# Patient Record
Sex: Female | Born: 1955 | Race: White | Hispanic: No | Smoking: Never smoker
Health system: Southern US, Community
[De-identification: ages and names within clinical notes are randomized; demographics above are authoritative.]

## PROBLEM LIST (undated history)

## (undated) DIAGNOSIS — E559 Vitamin D deficiency, unspecified: Secondary | ICD-10-CM

## (undated) DIAGNOSIS — I447 Left bundle-branch block, unspecified: Secondary | ICD-10-CM

## (undated) DIAGNOSIS — I219 Acute myocardial infarction, unspecified: Secondary | ICD-10-CM

## (undated) DIAGNOSIS — N189 Chronic kidney disease, unspecified: Secondary | ICD-10-CM

## (undated) DIAGNOSIS — I214 Non-ST elevation (NSTEMI) myocardial infarction: Secondary | ICD-10-CM

## (undated) DIAGNOSIS — A419 Sepsis, unspecified organism: Secondary | ICD-10-CM

## (undated) DIAGNOSIS — L97409 Non-pressure chronic ulcer of unspecified heel and midfoot with unspecified severity: Secondary | ICD-10-CM

## (undated) DIAGNOSIS — I10 Essential (primary) hypertension: Secondary | ICD-10-CM

## (undated) DIAGNOSIS — Z95810 Presence of automatic (implantable) cardiac defibrillator: Secondary | ICD-10-CM

## (undated) DIAGNOSIS — I2510 Atherosclerotic heart disease of native coronary artery without angina pectoris: Secondary | ICD-10-CM

## (undated) DIAGNOSIS — G4733 Obstructive sleep apnea (adult) (pediatric): Secondary | ICD-10-CM

## (undated) DIAGNOSIS — E119 Type 2 diabetes mellitus without complications: Secondary | ICD-10-CM

## (undated) DIAGNOSIS — I5021 Acute systolic (congestive) heart failure: Secondary | ICD-10-CM

## (undated) DIAGNOSIS — H269 Unspecified cataract: Secondary | ICD-10-CM

## (undated) DIAGNOSIS — Z9989 Dependence on other enabling machines and devices: Secondary | ICD-10-CM

## (undated) DIAGNOSIS — I5022 Chronic systolic (congestive) heart failure: Secondary | ICD-10-CM

## (undated) DIAGNOSIS — M86179 Other acute osteomyelitis, unspecified ankle and foot: Secondary | ICD-10-CM

## (undated) DIAGNOSIS — Z9289 Personal history of other medical treatment: Secondary | ICD-10-CM

## (undated) DIAGNOSIS — G629 Polyneuropathy, unspecified: Secondary | ICD-10-CM

## (undated) DIAGNOSIS — B019 Varicella without complication: Secondary | ICD-10-CM

## (undated) DIAGNOSIS — E669 Obesity, unspecified: Secondary | ICD-10-CM

## (undated) DIAGNOSIS — E039 Hypothyroidism, unspecified: Secondary | ICD-10-CM

## (undated) DIAGNOSIS — F329 Major depressive disorder, single episode, unspecified: Secondary | ICD-10-CM

## (undated) DIAGNOSIS — E7800 Pure hypercholesterolemia, unspecified: Secondary | ICD-10-CM

## (undated) DIAGNOSIS — G709 Myoneural disorder, unspecified: Secondary | ICD-10-CM

## (undated) DIAGNOSIS — F32A Depression, unspecified: Secondary | ICD-10-CM

## (undated) DIAGNOSIS — M1990 Unspecified osteoarthritis, unspecified site: Secondary | ICD-10-CM

## (undated) DIAGNOSIS — D649 Anemia, unspecified: Secondary | ICD-10-CM

## (undated) HISTORY — DX: Vitamin D deficiency, unspecified: E55.9

## (undated) HISTORY — DX: Other acute osteomyelitis, unspecified ankle and foot: M86.179

## (undated) HISTORY — PX: BACK SURGERY: SHX140

## (undated) HISTORY — DX: Non-ST elevation (NSTEMI) myocardial infarction: I21.4

## (undated) HISTORY — DX: Varicella without complication: B01.9

## (undated) HISTORY — DX: Pure hypercholesterolemia, unspecified: E78.00

## (undated) HISTORY — DX: Unspecified cataract: H26.9

## (undated) HISTORY — DX: Depression, unspecified: F32.A

## (undated) HISTORY — DX: Essential (primary) hypertension: I10

## (undated) HISTORY — DX: Myoneural disorder, unspecified: G70.9

## (undated) HISTORY — DX: Major depressive disorder, single episode, unspecified: F32.9

## (undated) HISTORY — DX: Non-pressure chronic ulcer of unspecified heel and midfoot with unspecified severity: L97.409

## (undated) HISTORY — DX: Chronic kidney disease, unspecified: N18.9

## (undated) HISTORY — DX: Polyneuropathy, unspecified: G62.9

## (undated) HISTORY — DX: Obesity, unspecified: E66.9

## (undated) HISTORY — DX: Dependence on other enabling machines and devices: Z99.89

## (undated) HISTORY — DX: Acute systolic (congestive) heart failure: I50.21

## (undated) HISTORY — DX: Obstructive sleep apnea (adult) (pediatric): G47.33

## (undated) HISTORY — DX: Anemia, unspecified: D64.9

---

## 1979-10-08 HISTORY — PX: TUBAL LIGATION: SHX77

## 1994-10-07 HISTORY — PX: LUMBAR DISC SURGERY: SHX700

## 1998-05-01 ENCOUNTER — Other Ambulatory Visit: Admission: RE | Admit: 1998-05-01 | Discharge: 1998-05-01 | Payer: Self-pay | Admitting: Obstetrics and Gynecology

## 1998-05-29 ENCOUNTER — Other Ambulatory Visit: Admission: RE | Admit: 1998-05-29 | Discharge: 1998-05-29 | Payer: Self-pay | Admitting: Obstetrics and Gynecology

## 1999-09-12 ENCOUNTER — Encounter: Admission: RE | Admit: 1999-09-12 | Discharge: 1999-09-12 | Payer: Self-pay | Admitting: Obstetrics and Gynecology

## 1999-09-12 ENCOUNTER — Encounter: Payer: Self-pay | Admitting: Obstetrics and Gynecology

## 2002-08-12 ENCOUNTER — Encounter: Payer: Self-pay | Admitting: Obstetrics and Gynecology

## 2002-08-12 ENCOUNTER — Encounter: Admission: RE | Admit: 2002-08-12 | Discharge: 2002-08-12 | Payer: Self-pay | Admitting: Obstetrics and Gynecology

## 2004-04-16 ENCOUNTER — Ambulatory Visit (HOSPITAL_COMMUNITY): Admission: RE | Admit: 2004-04-16 | Discharge: 2004-04-16 | Payer: Self-pay | Admitting: Obstetrics and Gynecology

## 2005-05-16 ENCOUNTER — Ambulatory Visit (HOSPITAL_COMMUNITY): Admission: RE | Admit: 2005-05-16 | Discharge: 2005-05-16 | Payer: Self-pay | Admitting: Obstetrics and Gynecology

## 2005-10-07 HISTORY — PX: INCISION AND DRAINAGE ABSCESS: SHX5864

## 2006-02-19 ENCOUNTER — Inpatient Hospital Stay (HOSPITAL_COMMUNITY): Admission: EM | Admit: 2006-02-19 | Discharge: 2006-03-07 | Payer: Self-pay | Admitting: Emergency Medicine

## 2006-02-19 ENCOUNTER — Ambulatory Visit: Payer: Self-pay | Admitting: Family Medicine

## 2006-05-19 ENCOUNTER — Ambulatory Visit (HOSPITAL_COMMUNITY): Admission: RE | Admit: 2006-05-19 | Discharge: 2006-05-19 | Payer: Self-pay | Admitting: Obstetrics and Gynecology

## 2007-09-07 ENCOUNTER — Encounter: Admission: RE | Admit: 2007-09-07 | Discharge: 2007-09-07 | Payer: Self-pay | Admitting: Obstetrics and Gynecology

## 2008-09-07 ENCOUNTER — Encounter: Admission: RE | Admit: 2008-09-07 | Discharge: 2008-09-07 | Payer: Self-pay | Admitting: Internal Medicine

## 2009-10-07 DIAGNOSIS — G629 Polyneuropathy, unspecified: Secondary | ICD-10-CM

## 2009-10-07 HISTORY — DX: Polyneuropathy, unspecified: G62.9

## 2009-10-07 HISTORY — PX: LAPAROSCOPIC CHOLECYSTECTOMY: SUR755

## 2009-10-11 ENCOUNTER — Encounter: Admission: RE | Admit: 2009-10-11 | Discharge: 2009-10-11 | Payer: Self-pay | Admitting: Obstetrics and Gynecology

## 2009-11-27 ENCOUNTER — Encounter: Admission: RE | Admit: 2009-11-27 | Discharge: 2009-11-27 | Payer: Self-pay | Admitting: Family Medicine

## 2009-12-31 ENCOUNTER — Emergency Department (HOSPITAL_COMMUNITY): Admission: EM | Admit: 2009-12-31 | Discharge: 2009-12-31 | Payer: Self-pay | Admitting: Emergency Medicine

## 2010-02-09 ENCOUNTER — Ambulatory Visit (HOSPITAL_COMMUNITY): Admission: RE | Admit: 2010-02-09 | Discharge: 2010-02-09 | Payer: Self-pay | Admitting: General Surgery

## 2010-02-22 ENCOUNTER — Ambulatory Visit (HOSPITAL_COMMUNITY): Admission: RE | Admit: 2010-02-22 | Discharge: 2010-02-22 | Payer: Self-pay | Admitting: Cardiology

## 2010-02-23 ENCOUNTER — Encounter: Admission: RE | Admit: 2010-02-23 | Discharge: 2010-02-23 | Payer: Self-pay | Admitting: General Surgery

## 2010-03-22 ENCOUNTER — Encounter: Admission: RE | Admit: 2010-03-22 | Discharge: 2010-03-22 | Payer: Self-pay | Admitting: Internal Medicine

## 2010-05-02 ENCOUNTER — Encounter: Admission: RE | Admit: 2010-05-02 | Discharge: 2010-05-02 | Payer: Self-pay | Admitting: Obstetrics and Gynecology

## 2010-05-17 ENCOUNTER — Ambulatory Visit: Payer: Self-pay | Admitting: Family Medicine

## 2010-05-17 ENCOUNTER — Inpatient Hospital Stay (HOSPITAL_COMMUNITY): Admission: EM | Admit: 2010-05-17 | Discharge: 2010-05-23 | Payer: Self-pay | Admitting: Internal Medicine

## 2010-05-18 ENCOUNTER — Ambulatory Visit: Payer: Self-pay | Admitting: Physical Medicine & Rehabilitation

## 2010-05-23 ENCOUNTER — Inpatient Hospital Stay (HOSPITAL_COMMUNITY)
Admission: RE | Admit: 2010-05-23 | Discharge: 2010-06-05 | Payer: Self-pay | Admitting: Physical Medicine & Rehabilitation

## 2010-05-25 ENCOUNTER — Ambulatory Visit: Payer: Self-pay | Admitting: Physical Medicine & Rehabilitation

## 2010-06-06 ENCOUNTER — Encounter: Payer: Self-pay | Admitting: Physical Medicine & Rehabilitation

## 2010-06-07 ENCOUNTER — Encounter: Payer: Self-pay | Admitting: Physical Medicine & Rehabilitation

## 2010-07-04 ENCOUNTER — Encounter
Admission: RE | Admit: 2010-07-04 | Discharge: 2010-07-11 | Payer: Self-pay | Source: Home / Self Care | Attending: Physical Medicine & Rehabilitation | Admitting: Physical Medicine & Rehabilitation

## 2010-07-07 ENCOUNTER — Encounter: Payer: Self-pay | Admitting: Physical Medicine & Rehabilitation

## 2010-07-11 ENCOUNTER — Ambulatory Visit: Payer: Self-pay | Admitting: Physical Medicine & Rehabilitation

## 2010-08-07 ENCOUNTER — Encounter: Payer: Self-pay | Admitting: Physical Medicine & Rehabilitation

## 2010-09-06 ENCOUNTER — Encounter: Payer: Self-pay | Admitting: Physical Medicine & Rehabilitation

## 2010-09-21 ENCOUNTER — Ambulatory Visit: Payer: Self-pay | Admitting: Physical Medicine & Rehabilitation

## 2010-10-07 ENCOUNTER — Encounter: Payer: Self-pay | Admitting: Physical Medicine & Rehabilitation

## 2010-10-22 ENCOUNTER — Encounter
Admission: RE | Admit: 2010-10-22 | Discharge: 2010-10-22 | Payer: Self-pay | Source: Home / Self Care | Attending: Obstetrics and Gynecology | Admitting: Obstetrics and Gynecology

## 2010-10-24 ENCOUNTER — Encounter
Admission: RE | Admit: 2010-10-24 | Discharge: 2010-10-29 | Payer: Self-pay | Source: Home / Self Care | Attending: Physical Medicine & Rehabilitation | Admitting: Physical Medicine & Rehabilitation

## 2010-10-28 ENCOUNTER — Encounter: Payer: Self-pay | Admitting: General Surgery

## 2010-10-29 ENCOUNTER — Ambulatory Visit
Admission: RE | Admit: 2010-10-29 | Discharge: 2010-10-29 | Payer: Self-pay | Source: Home / Self Care | Attending: Physical Medicine & Rehabilitation | Admitting: Physical Medicine & Rehabilitation

## 2010-11-07 ENCOUNTER — Encounter: Payer: Self-pay | Admitting: Physical Medicine & Rehabilitation

## 2010-11-28 ENCOUNTER — Ambulatory Visit (INDEPENDENT_AMBULATORY_CARE_PROVIDER_SITE_OTHER): Payer: BC Managed Care – PPO | Admitting: Gynecology

## 2010-11-28 DIAGNOSIS — D259 Leiomyoma of uterus, unspecified: Secondary | ICD-10-CM

## 2010-11-28 DIAGNOSIS — N898 Other specified noninflammatory disorders of vagina: Secondary | ICD-10-CM

## 2010-12-03 ENCOUNTER — Other Ambulatory Visit: Payer: BC Managed Care – PPO

## 2010-12-03 ENCOUNTER — Ambulatory Visit (INDEPENDENT_AMBULATORY_CARE_PROVIDER_SITE_OTHER): Payer: BC Managed Care – PPO | Admitting: Gynecology

## 2010-12-03 DIAGNOSIS — R109 Unspecified abdominal pain: Secondary | ICD-10-CM

## 2010-12-03 DIAGNOSIS — N83209 Unspecified ovarian cyst, unspecified side: Secondary | ICD-10-CM

## 2010-12-03 DIAGNOSIS — R1904 Left lower quadrant abdominal swelling, mass and lump: Secondary | ICD-10-CM

## 2010-12-03 DIAGNOSIS — D259 Leiomyoma of uterus, unspecified: Secondary | ICD-10-CM

## 2010-12-05 ENCOUNTER — Emergency Department: Payer: Self-pay | Admitting: Emergency Medicine

## 2010-12-06 ENCOUNTER — Encounter: Payer: Self-pay | Admitting: Physical Medicine & Rehabilitation

## 2010-12-21 LAB — GLUCOSE, CAPILLARY
Glucose-Capillary: 100 mg/dL — ABNORMAL HIGH (ref 70–99)
Glucose-Capillary: 103 mg/dL — ABNORMAL HIGH (ref 70–99)
Glucose-Capillary: 120 mg/dL — ABNORMAL HIGH (ref 70–99)
Glucose-Capillary: 122 mg/dL — ABNORMAL HIGH (ref 70–99)
Glucose-Capillary: 123 mg/dL — ABNORMAL HIGH (ref 70–99)
Glucose-Capillary: 124 mg/dL — ABNORMAL HIGH (ref 70–99)
Glucose-Capillary: 130 mg/dL — ABNORMAL HIGH (ref 70–99)
Glucose-Capillary: 134 mg/dL — ABNORMAL HIGH (ref 70–99)
Glucose-Capillary: 136 mg/dL — ABNORMAL HIGH (ref 70–99)
Glucose-Capillary: 140 mg/dL — ABNORMAL HIGH (ref 70–99)
Glucose-Capillary: 144 mg/dL — ABNORMAL HIGH (ref 70–99)
Glucose-Capillary: 145 mg/dL — ABNORMAL HIGH (ref 70–99)
Glucose-Capillary: 146 mg/dL — ABNORMAL HIGH (ref 70–99)
Glucose-Capillary: 147 mg/dL — ABNORMAL HIGH (ref 70–99)
Glucose-Capillary: 147 mg/dL — ABNORMAL HIGH (ref 70–99)
Glucose-Capillary: 148 mg/dL — ABNORMAL HIGH (ref 70–99)
Glucose-Capillary: 149 mg/dL — ABNORMAL HIGH (ref 70–99)
Glucose-Capillary: 153 mg/dL — ABNORMAL HIGH (ref 70–99)
Glucose-Capillary: 155 mg/dL — ABNORMAL HIGH (ref 70–99)
Glucose-Capillary: 155 mg/dL — ABNORMAL HIGH (ref 70–99)
Glucose-Capillary: 157 mg/dL — ABNORMAL HIGH (ref 70–99)
Glucose-Capillary: 158 mg/dL — ABNORMAL HIGH (ref 70–99)
Glucose-Capillary: 160 mg/dL — ABNORMAL HIGH (ref 70–99)
Glucose-Capillary: 160 mg/dL — ABNORMAL HIGH (ref 70–99)
Glucose-Capillary: 161 mg/dL — ABNORMAL HIGH (ref 70–99)
Glucose-Capillary: 164 mg/dL — ABNORMAL HIGH (ref 70–99)
Glucose-Capillary: 167 mg/dL — ABNORMAL HIGH (ref 70–99)
Glucose-Capillary: 170 mg/dL — ABNORMAL HIGH (ref 70–99)
Glucose-Capillary: 173 mg/dL — ABNORMAL HIGH (ref 70–99)
Glucose-Capillary: 189 mg/dL — ABNORMAL HIGH (ref 70–99)
Glucose-Capillary: 194 mg/dL — ABNORMAL HIGH (ref 70–99)
Glucose-Capillary: 209 mg/dL — ABNORMAL HIGH (ref 70–99)
Glucose-Capillary: 221 mg/dL — ABNORMAL HIGH (ref 70–99)
Glucose-Capillary: 222 mg/dL — ABNORMAL HIGH (ref 70–99)
Glucose-Capillary: 226 mg/dL — ABNORMAL HIGH (ref 70–99)
Glucose-Capillary: 234 mg/dL — ABNORMAL HIGH (ref 70–99)
Glucose-Capillary: 234 mg/dL — ABNORMAL HIGH (ref 70–99)
Glucose-Capillary: 255 mg/dL — ABNORMAL HIGH (ref 70–99)
Glucose-Capillary: 75 mg/dL (ref 70–99)
Glucose-Capillary: 78 mg/dL (ref 70–99)
Glucose-Capillary: 78 mg/dL (ref 70–99)
Glucose-Capillary: 85 mg/dL (ref 70–99)
Glucose-Capillary: 87 mg/dL (ref 70–99)
Glucose-Capillary: 88 mg/dL (ref 70–99)
Glucose-Capillary: 93 mg/dL (ref 70–99)

## 2010-12-21 LAB — URINE MICROSCOPIC-ADD ON

## 2010-12-21 LAB — DIFFERENTIAL
Basophils Absolute: 0 10*3/uL (ref 0.0–0.1)
Eosinophils Relative: 1 % (ref 0–5)
Lymphocytes Relative: 19 % (ref 12–46)
Lymphocytes Relative: 38 % (ref 12–46)
Monocytes Absolute: 0.6 10*3/uL (ref 0.1–1.0)
Monocytes Relative: 11 % (ref 3–12)
Neutro Abs: 2.7 10*3/uL (ref 1.7–7.7)
Neutro Abs: 7.8 10*3/uL — ABNORMAL HIGH (ref 1.7–7.7)
Neutrophils Relative %: 72 % (ref 43–77)

## 2010-12-21 LAB — BASIC METABOLIC PANEL
BUN: 14 mg/dL (ref 6–23)
BUN: 26 mg/dL — ABNORMAL HIGH (ref 6–23)
BUN: 28 mg/dL — ABNORMAL HIGH (ref 6–23)
CO2: 26 mEq/L (ref 19–32)
CO2: 27 mEq/L (ref 19–32)
CO2: 28 mEq/L (ref 19–32)
CO2: 29 mEq/L (ref 19–32)
CO2: 29 mEq/L (ref 19–32)
Calcium: 8.2 mg/dL — ABNORMAL LOW (ref 8.4–10.5)
Calcium: 8.2 mg/dL — ABNORMAL LOW (ref 8.4–10.5)
Calcium: 8.4 mg/dL (ref 8.4–10.5)
Calcium: 8.6 mg/dL (ref 8.4–10.5)
Calcium: 9 mg/dL (ref 8.4–10.5)
Chloride: 101 mEq/L (ref 96–112)
Chloride: 95 mEq/L — ABNORMAL LOW (ref 96–112)
Creatinine, Ser: 0.82 mg/dL (ref 0.4–1.2)
Creatinine, Ser: 2.53 mg/dL — ABNORMAL HIGH (ref 0.4–1.2)
GFR calc Af Amer: 24 mL/min — ABNORMAL LOW (ref >60)
GFR calc Af Amer: >60 mL/min (ref >60)
GFR calc Af Amer: >60 mL/min (ref >60)
GFR calc Af Amer: >60 mL/min (ref >60)
GFR calc non Af Amer: 18 mL/min — ABNORMAL LOW (ref >60)
GFR calc non Af Amer: 20 mL/min — ABNORMAL LOW (ref >60)
GFR calc non Af Amer: 28 mL/min — ABNORMAL LOW (ref >60)
GFR calc non Af Amer: >60 mL/min (ref >60)
Glucose, Bld: 130 mg/dL — ABNORMAL HIGH (ref 70–99)
Glucose, Bld: 139 mg/dL — ABNORMAL HIGH (ref 70–99)
Glucose, Bld: 257 mg/dL — ABNORMAL HIGH (ref 70–99)
Glucose, Bld: 262 mg/dL — ABNORMAL HIGH (ref 70–99)
Potassium: 3.3 mEq/L — ABNORMAL LOW (ref 3.5–5.1)
Potassium: 3.6 mEq/L (ref 3.5–5.1)
Potassium: 3.7 mEq/L (ref 3.5–5.1)
Potassium: 3.8 mEq/L (ref 3.5–5.1)
Sodium: 133 mEq/L — ABNORMAL LOW (ref 135–145)
Sodium: 133 mEq/L — ABNORMAL LOW (ref 135–145)
Sodium: 134 mEq/L — ABNORMAL LOW (ref 135–145)
Sodium: 135 mEq/L (ref 135–145)
Sodium: 136 mEq/L (ref 135–145)
Sodium: 139 mEq/L (ref 135–145)

## 2010-12-21 LAB — CBC
HCT: 30.4 % — ABNORMAL LOW (ref 36.0–46.0)
HCT: 32.4 % — ABNORMAL LOW (ref 36.0–46.0)
HCT: 33.8 % — ABNORMAL LOW (ref 36.0–46.0)
Hemoglobin: 10.7 g/dL — ABNORMAL LOW (ref 12.0–15.0)
Hemoglobin: 11.1 g/dL — ABNORMAL LOW (ref 12.0–15.0)
Hemoglobin: 12 g/dL (ref 12.0–15.0)
Hemoglobin: 12.3 g/dL (ref 12.0–15.0)
MCH: 29.4 pg (ref 26.0–34.0)
MCH: 29.7 pg (ref 26.0–34.0)
MCH: 29.9 pg (ref 26.0–34.0)
MCH: 30.2 pg (ref 26.0–34.0)
MCH: 30.4 pg (ref 26.0–34.0)
MCHC: 34.6 g/dL (ref 30.0–36.0)
MCHC: 35.5 g/dL (ref 30.0–36.0)
MCV: 84.4 fL (ref 78.0–100.0)
MCV: 85.6 fL (ref 78.0–100.0)
MCV: 86 fL (ref 78.0–100.0)
Platelets: 193 10*3/uL (ref 150–400)
Platelets: 212 10*3/uL (ref 150–400)
Platelets: 257 10*3/uL (ref 150–400)
RBC: 3.58 MIL/uL — ABNORMAL LOW (ref 3.87–5.11)
RBC: 3.78 MIL/uL — ABNORMAL LOW (ref 3.87–5.11)
RBC: 3.95 MIL/uL (ref 3.87–5.11)
RBC: 4.17 MIL/uL (ref 3.87–5.11)
RDW: 12.8 % (ref 11.5–15.5)
RDW: 13.2 % (ref 11.5–15.5)
RDW: 13.4 % (ref 11.5–15.5)
WBC: 10.9 10*3/uL — ABNORMAL HIGH (ref 4.0–10.5)
WBC: 5.4 10*3/uL (ref 4.0–10.5)

## 2010-12-21 LAB — URINALYSIS, ROUTINE W REFLEX MICROSCOPIC
Glucose, UA: 250 mg/dL — AB
Hgb urine dipstick: NEGATIVE
pH: 5 (ref 5.0–8.0)

## 2010-12-21 LAB — LIPID PANEL
Cholesterol: 150 mg/dL (ref 0–200)
LDL Cholesterol: 58 mg/dL (ref 0–99)
Total CHOL/HDL Ratio: 4.8 RATIO
VLDL: 61 mg/dL — ABNORMAL HIGH (ref 0–40)

## 2010-12-21 LAB — COMPREHENSIVE METABOLIC PANEL
Albumin: 3.2 g/dL — ABNORMAL LOW (ref 3.5–5.2)
BUN: 18 mg/dL (ref 6–23)
Calcium: 9 mg/dL (ref 8.4–10.5)
Creatinine, Ser: 0.85 mg/dL (ref 0.4–1.2)
Potassium: 3.8 mEq/L (ref 3.5–5.1)
Total Protein: 8.6 g/dL — ABNORMAL HIGH (ref 6.0–8.3)

## 2010-12-21 LAB — CARDIAC PANEL(CRET KIN+CKTOT+MB+TROPI)
CK, MB: 1.1 ng/mL (ref 0.3–4.0)
Relative Index: INVALID (ref 0.0–2.5)
Troponin I: 0.02 ng/mL (ref 0.00–0.06)

## 2010-12-21 LAB — HIGH SENSITIVITY CRP: CRP, High Sensitivity: 23.2 mg/L — ABNORMAL HIGH

## 2010-12-21 LAB — HEMOGLOBIN A1C
Hgb A1c MFr Bld: 8.1 % — ABNORMAL HIGH (ref <5.7)
Mean Plasma Glucose: 186 mg/dL — ABNORMAL HIGH (ref <117)

## 2010-12-21 LAB — CK TOTAL AND CKMB (NOT AT ARMC)
CK, MB: 1.3 ng/mL (ref 0.3–4.0)
Relative Index: INVALID (ref 0.0–2.5)

## 2010-12-25 LAB — COMPREHENSIVE METABOLIC PANEL
ALT: 10 U/L (ref 0–35)
AST: 13 U/L (ref 0–37)
Albumin: 3.9 g/dL (ref 3.5–5.2)
Alkaline Phosphatase: 76 U/L (ref 39–117)
Chloride: 102 mEq/L (ref 96–112)
GFR calc Af Amer: >60 mL/min (ref >60)
Potassium: 4.6 mEq/L (ref 3.5–5.1)
Total Bilirubin: 0.4 mg/dL (ref 0.3–1.2)

## 2010-12-25 LAB — DIFFERENTIAL
Basophils Absolute: 0 10*3/uL (ref 0.0–0.1)
Basophils Relative: 0 % (ref 0–1)
Eosinophils Relative: 1 % (ref 0–5)
Monocytes Absolute: 0.5 10*3/uL (ref 0.1–1.0)

## 2010-12-25 LAB — GLUCOSE, CAPILLARY

## 2010-12-25 LAB — CBC
Platelets: 238 10*3/uL (ref 150–400)
WBC: 8.4 10*3/uL (ref 4.0–10.5)

## 2010-12-30 LAB — CBC
Hemoglobin: 14.1 g/dL (ref 12.0–15.0)
MCHC: 34.9 g/dL (ref 30.0–36.0)
RBC: 4.65 MIL/uL (ref 3.87–5.11)

## 2010-12-30 LAB — COMPREHENSIVE METABOLIC PANEL
ALT: 13 U/L (ref 0–35)
Alkaline Phosphatase: 73 U/L (ref 39–117)
CO2: 27 mEq/L (ref 19–32)
Calcium: 8.8 mg/dL (ref 8.4–10.5)
GFR calc non Af Amer: >60 mL/min (ref >60)
Glucose, Bld: 287 mg/dL — ABNORMAL HIGH (ref 70–99)
Sodium: 135 mEq/L (ref 135–145)

## 2010-12-30 LAB — D-DIMER, QUANTITATIVE: D-Dimer, Quant: <0.22 ug/mL-FEU (ref 0.00–0.48)

## 2010-12-30 LAB — DIFFERENTIAL
Basophils Absolute: 0 10*3/uL (ref 0.0–0.1)
Basophils Relative: 1 % (ref 0–1)
Eosinophils Absolute: 0.1 10*3/uL (ref 0.0–0.7)
Lymphs Abs: 2.1 10*3/uL (ref 0.7–4.0)
Neutrophils Relative %: 63 % (ref 43–77)

## 2010-12-30 LAB — POCT CARDIAC MARKERS

## 2011-01-06 ENCOUNTER — Encounter: Payer: Self-pay | Admitting: Physical Medicine & Rehabilitation

## 2011-01-06 HISTORY — PX: BILATERAL OOPHORECTOMY: SHX1221

## 2011-01-06 HISTORY — PX: VAGINAL HYSTERECTOMY: SUR661

## 2011-01-21 ENCOUNTER — Encounter: Payer: BC Managed Care – PPO | Admitting: Physical Medicine & Rehabilitation

## 2011-01-25 ENCOUNTER — Encounter
Payer: BC Managed Care – PPO | Attending: Physical Medicine & Rehabilitation | Admitting: Physical Medicine & Rehabilitation

## 2011-01-25 DIAGNOSIS — G545 Neuralgic amyotrophy: Secondary | ICD-10-CM

## 2011-01-25 DIAGNOSIS — Z90710 Acquired absence of both cervix and uterus: Secondary | ICD-10-CM | POA: Insufficient documentation

## 2011-01-25 DIAGNOSIS — E1149 Type 2 diabetes mellitus with other diabetic neurological complication: Secondary | ICD-10-CM

## 2011-01-25 DIAGNOSIS — M79609 Pain in unspecified limb: Secondary | ICD-10-CM | POA: Insufficient documentation

## 2011-01-25 DIAGNOSIS — R29898 Other symptoms and signs involving the musculoskeletal system: Secondary | ICD-10-CM | POA: Insufficient documentation

## 2011-01-25 DIAGNOSIS — R5381 Other malaise: Secondary | ICD-10-CM

## 2011-01-25 NOTE — Assessment & Plan Note (Signed)
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  She had a hysterectomy last week for cyst on her ovary and in her uterus.  She is doing quite well from that period of therapy at discharge for about week or so prior.  She still has some tingling and aching in the legs.  She reports some pain on the right shin and seems to bother her at night frequently. Seems to be associated more when she is active.  She has her left AFO, is doing well.  She is walking at home with a walker, but does do walking specifically with a cane, but prefers if someone is with her.  REVIEW OF SYSTEMS:  Notable for some weight loss.  She reports decreased appetite at times.  Full 12-point review is in the written health and history section of the chart.  SOCIAL HISTORY:  The patient is married with her husband and is supportive.  PHYSICAL EXAMINATION:  VITAL SIGNS:  Blood pressure 119/57, pulse 89, respiratory rate 18, she is satting 97% on room air. GENERAL:  The patient is pleasant, alert, and oriented x3. MUSCULOSKELETAL:  She continues to improve strength wise and hip flexion is 2 to 2+ out of 5.  She is 3+ out of 5 with left knee extension, 3+ to 4 with right knee extension.  She has trace ankle dorsiflexion on the left and 3/5 plantar flexion.  Right ankle is 3+ to 4 out of 5.  Sensory exam remains diminished still in the legs in a variable fashion.  She walked for me today and tended to hyperextend the left knee in stance phase.  Her gait overall is much improved with her AFO.  I had her walk without the walker and her deficits were more pronounced.  ASSESSMENT:  Diabetic amyotrophy, lower extremity weakness, pain syndrome.  PLAN: 1. The patient has done well with the nortriptyline change.  We will     stay with this for now. 2. Discussed working on gait techniques to improve knee control.  She     is to work on quad and Environmental health practitioner.  Aquatic walking     would be good for her as  well. 3. She will stay on Neurontin and tramadol. 4. Encourage walking with the walker at home for now for exercise.     She can walk with her cane with assistance only, my opinion.  As     she improves her technique, I think she can advance to a cane. 5. Shin pain is likely mild stress reaction or shin splints.     Encouraged her to continue exercise and intolerance.  She can apply     ice and/or heat as needed for symptoms.  If symptoms progress, she     should call me. 6. I will see her back in 6 months.  She has made really nice progress     overall.     Meredith Staggers, M.D. Electronically Signed    ZTS/MedQ D:  01/25/2011 09:47:52  T:  01/25/2011 22:10:19  Job #:  [id number]  cc:   Dr. Morene Antu

## 2011-02-22 NOTE — Op Note (Signed)
NAME:  [name], [name] NO.:  [id number]   MEDICAL RECORD NO.:  [id number]          PATIENT TYPE:  INP   LOCATION:  2101                         FACILITY:  [facility]   PHYSICIAN:  Gwenyth Ober, M.D.    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   PROCEDURE:  Incision, drainage and debridement of right vulvar and perineal  abscess.   SURGEON:  Dr. Hulen Skains.   ASSISTANT:  None.   ANESTHESIA:  General endotracheal.   ESTIMATED BLOOD LOSS:  Less than 100 mL.   COMPLICATIONS:  None.   CONDITION:  Stable.   FINDINGS:  The patient had necrotizing soft tissue infection of the vulva  and the mons pubis on the right side extending up to the upper portion of  the mons pubis involving most of the right vulva anteriorly and posteriorly.  It did not appear to extend into the thigh.   OPERATION:  The patient was taken to the operating room, placed on table in  supine position.  After an adequate general anesthetic was administered, she  was placed in lithotomy position and then prepped and draped in usual  sterile manner.   Initially aerobic and anaerobic cultures were taken after prepping.  We then  passed a [name] clamp into the opening in sort of thigh and perineal fold on  the right side.  We opened up along the tract with a [name] clamp and  superiorly and inferiorly, eventually opening it up to approximately 15-20  cm.  We debrided of soft tissue which was necrotic and stained with dark  greenish brown fluid throughout the wound.  Much of this was done bluntly  with [name] clamps and also sharply with electrocautery.   There was a second punctate incision along the mons pubis which we extended  and got  down into the soft tissue of that area which also was stained with  the anaerobic wound infection.  Much of the tissue and fatty tissue of the  mons pubis was debrided sharply and bluntly removing the multiple areas of  foul-smelling anaerobic growth.  We  irrigated this and lavaged with about 2.5 liters of saline solution using  the pulse and jet lavage.  Free particles and fluid were taken out and we  packed the wound with two full Kerlix gauzes.  All counts were correct.  There was minimal bleeding.  The entire wound probably this measured about  15-20 cm x 8 cm.      Gwenyth Ober, M.D.  Electronically Signed     JOW/MEDQ  D:  02/20/2006  T:  02/21/2006  Job:  [id number]

## 2011-02-22 NOTE — H&P (Signed)
NAME:  [name], [name] NO.:  [id number]   MEDICAL RECORD NO.:  [id number]          PATIENT TYPE:  INP   LOCATION:  1827                         FACILITY:  [facility]   PHYSICIAN:  Blane Ohara McDiarmid, M.D.DATE OF BIRTH:  24-Mar-1956   DATE OF ADMISSION:  02/19/2006  DATE OF DISCHARGE:                                HISTORY & PHYSICAL   PRIMARY CARE PHYSICIAN:  [name], M.D.   CHIEF COMPLAINT:  Right thigh pain.   Patient is a pleasant 55 year old diabetic female who presented to urgent  care two days before admission.  She presented with a right abscess.  She  had noticed increasing pain, erythema, and swelling in the 24-36 hours  before presentation to urgent care.  As this gradually increased, it became  unbearable, and she eventually presented on the 14th.  She was evaluated by  Dr. Tamala Julian.  At this time, she was noted to have fever along with a white  count of 16.5.  She was given a shot of Rocephin 1 gm IM.  She was also  noted to have a greatly elevated blood glucose level.  Of note, the patient  had been off all oral diabetic agents for almost one month.  She had only  recently been started back on medicines.   The patient returned to urgent care on May 15.  At this point, she was  placed on Septra and Bactrim.  The lesion was incised and drained and then  packed.  It did produce, per the patient's report, copious brown pus.  Her  CBG was again elevated, and she was given 20 units of Lantus.  She returned  on May 16 to urgent care.  A second incision and drainage was attempted.  This time, up near the labia on the right with erythema and swelling with  spreading.  This was not successful, not much fluid could be expressed.  Her  CBG was 875.  She was given 25 units of Lantus and sent for admission to  [facility].   PAST MEDICAL HISTORY:  1.  Diabetes mellitus, uncontrolled.  Off all medications for approximately      one month.  2.  Hypertension,  previously treatment with lisinopril, unknown dose.  3.  History of back surgery.  4.  History of bilateral tubal ligation.  5.  History of STDs x2.   FAMILY HISTORY:  Patient has a 57 year old diabetic mother who is alive.  Father is 42.  He has lymphoma.  Her son is 50.  The daughter is 3.  They  are healthy.   SOCIAL HISTORY:  Patient works at a daycare.  Her husband does have a  history of boils.  She does not smoke, but she uses rare alcohol.  She does  not use any illicit drugs.   ALLERGIES:  No known drug allergies.   MEDICATIONS:  1.  __________ .  2.  __________ .  3.  Lantus given in urgent care on May 15.  4.  Phenergan 25 mg p.o. q.6h. p.r.n. nausea.  Again, started on May 15.  5.  Lisinopril,  unknown dose.   REVIEW OF SYSTEMS:  The patient has vomited on Feb 17, 2006.  She has had a  significant decrease in p.o. and decrease in urine output.  She has noted  fevers and chills.   PHYSICAL EXAMINATION:  VITAL SIGNS:  Temperature 97.8, pulse 111,  respiratory rate 24, blood pressure 144/73.  SaO2 94% on room air.  GENERAL:  The patient is in no apparent distress except with manipulation of  abscess cellulitic  area.  HEENT:  Normocephalic and atraumatic.  Pupils are equal, round and reactive  to light.  Mucous membranes are mildly dry.  There is no oropharyngeal  erythema.  NECK:  Supple.  CHEST:  Clear to auscultation bilaterally.  HEART:  Regular rate and rhythm.  S1 and S2 are appreciated.  There is a  systolic ejection murmur that is consistent with a flow murmur.  Her  cardiovascular exam is limited by habitus.  ABDOMEN:  Soft.  Positive bowel sounds.  Surgical scar is noted.  Belly is  nontender.  GU:  The labia on the right side is inflamed.  No pus can be expressed from  the most recent I&D site.  I can probe this area less than 1 cm.  The right  has an incision that is packed.  I am unable to probe or move the gauze  secondary to pain.  SKIN:  The area of  erythema is well demarcated.  It is  labeled with ink tonight.  NEUROLOGIC:  Grossly intact.  Distal sensation is within normal limits.  EXTREMITIES:  Distal pulses are 2+ bilaterally on the dorsalis pedis artery.  She has slightly delayed capillary refill on the bilateral upper and lower  extremities.   LABS/X-RAYS:  Wound cultures from Feb 18, 2006.  __________  is pending.  Blood cultures x2 are pending.  BMET and CBC are pending.  Hemoglobin and  __________  are pending.   IMPRESSION/PLAN:  Patient is a pleasant 55 year old Caucasian female with  the following problems:  Problem1:  Abscess:  The patient has significant erythema and edema.  The  inability to probe the defined labial lesion along with the greatly  increased CBG and the spreading erythema all are worrisome for abscess.  I  will check blood cultures x2 and begin vancomycin and Rocephin and follow  this.  I will give the Vicodin, as she is agreeable to pain meds by mouth.  I think it is reasonable to check a CT tonight to try to monitor [place] of  the abscess and __________  evaluation in the morning.  The wound culture  from Feb 18, 2006 is pending.  Would give the patient Phenergan for nausea.  Problem 2:  Diabetes type 2 with clinical dehydration:  At this point, labs  are pending but with the recent increased CBGs, it is reasonable to check a  BMET and go ahead and begin rehydration with normal saline.  We can continue  the Lantus and dose insulin after her electrolytes are back and give  consideration to her glucose and potassium.  We will monitor this throughout  the admission and also check an A1C.  Problem 3:  History of hypertension:  Hold her ACE inhibitor and follow her  blood pressure.  Problem 4:  Diet:  We will put the patient on a diabetic diet when she does  want to eat.  Problem 5:  Deep venous thrombosis prophylaxis:  SCDs to the bilateral lower  extremities.  Up out  of bed as tolerated with  supervision.  Problem 6:  Check fasting lipid panel in the a.m. along with basic metabolic  panel.   CODE STATUS:  Patient is a full code.      Elveria Rising Damita Dunnings, M.D.    ______________________________  Blane Ohara McDiarmid, M.D.    GSD/MEDQ  D:  02/19/2006  T:  02/19/2006  Job:  [id number]   cc:   [name], M.D.  Fax: [phone]

## 2011-02-22 NOTE — Discharge Summary (Signed)
NAME:  [name], SECORA NO.:  [id number]   MEDICAL RECORD NO.:  [id number]          PATIENT TYPE:  INP   LOCATION:  [id number]                         FACILITY:  [facility]   PHYSICIAN:  Georgina Quint, M.D.   DATE OF BIRTH:  05-09-1956   DATE OF ADMISSION:  02/19/2006  DATE OF DISCHARGE:  03/07/2006                                 DISCHARGE SUMMARY   ADMITTING PHYSICIAN:  Dr. [name], with the teaching service.   DISCHARGING PHYSICIAN:  Dr. Deon Pilling.   CONSULTANTS:  Dr. Erroll Luna, with general surgery.   CHIEF COMPLAINT AND REASON FOR ADMISSION:  Ms. Bourdier is a 55 year old  female patient with a 5-day history of swelling in her right labia and right  groin.  She went to her primary care doctor and this was drained of copious  amount of brown pus were expressed 2 days prior to admission.  She returned  for follow up with increasing swelling and attempt was made to redrain this  area.  On lab check her glucose was over 800, sodium was 120, but because of  these findings she was admitted by the teaching service for uncontrolled  diabetes and infection and abscess involving the right labia.   A CT scan was obtained of this region and was some air was noted within the  subcutaneous tissues.  Surgical consultation was obtained, and Dr. Brantley Stage,  evaluated the patient.  Her vital signs were stable, and her white count was  elevated at 10,400.  A CT scan was reviewed by Dr. Brantley Stage, this did reveal  a right groin abscess with associated cellulitis.  Because of her severe  hyponatremia and hyperglycemia it was felt that the patient best be  stabilized with fluid resuscitation and correction of uncontrolled diabetes.  She did not show any evidence of any process that would require emergent  surgical debridement such as necrotizing infection and it was felt that the  air that was seen on CT was secondary to a recent instrumentation involving  incision and drainage at the  same site in the primary care office several  days before.   ADMISSION DIAGNOSES:  Patient was admitted by the medical team with the  following diagnoses:  1.  Labial abscess with associated cellulitis, severe.  2.  Type 2 diabetes, uncontrolled secondary to infection.  3.  Volume depletion and severe electrolyte imbalance with hyponatremia and      hyperglycemia secondary to wound control diabetes.  4.  History of hypertension.   HOSPITAL COURSE:  Patient was admitted, as noticed she was placed in the  ICU.  She was started on IV fluid hydration aggressively and IV antibiotics  of Rocephin and Vancomycin.  Within the first 3-4 hours, the Rocephin was  changed to Zosyn.  Patient was experiencing some problems with hypotension,  with blood pressures in the 80s.   By Feb 20, 2006, Dr. Hulen Skains had assumed care of the patient.  The patient's  white count was down to 9300, but after examining the patient's wound, he  felt that she had significant soft tissue infection.  Her sodium was  up 132,  potassium 3.3, and glucose down to 189; therefore, he felt she would be  appropriate for intraoperative debridement and drainage of the wound in the  OR.   The patient was taken to the operating room with a preoperative diagnosis of  necrotizing soft tissue infection of the right valva and perineal area.  Postoperative diagnosis was the same.  She underwent an incision, drainage  and debridement of the perineal tissue as well as the right valva tissue.  She was sent to the PACU in stable condition with an EBL of less than 100  mL.  Anaerobic and aerobic cultures were obtained in the operating room.   By postoperative day 1, patient vital signs were stable.  She had a T-max of  101.8.  She had a significant amount of fluid volume infused of nearly 8000  mL over the past 24 hours, with a urine output of 1600.  BP remains low  remains low.  Urine output remains low.  Creatinine was stable 1.3 and   potassium was 4.1.  She remained in the critical care setting.  The wound  remained clean with a small amount of yellowish necrotic material  posteriorly, but was already beginning to granulate.  She was continued on  pulse lavage with b.i.d. dressing changes and Vancomycin and Zosyn had also  been continued in the postoperative period.  Over the next several day,  patient slowly began to improve.  Her vital signs rebounded back to more  normal with stable blood pressure soon this she was deemed appropriate for  transfer to the general medical floor.  Postoperatively, she was noted to  have mild anemia and anemia indices were checked.  Teaching service  continued to follow with [place], making adjustments in patient's diabetic  medications.  Because of the severity of her wound and the severity of her  hyperglycemia, she had been placed on Lantus insulin during this  hospitalization as well as several other diabetic medications added once  oral intake began.  Hemoglobin A1c was noted to be 13.1 this admission.   By postoperative day 7, the wound vac remained in place.  At this point  there was some discussion as to whether the patient would need the wound vac  at home, or whether she would go home on normal saline wet to dry dressings,  noting that she had a very large valva to perineal wound with a second  larger stab wound more medial that communicated with the initial large  wound.  This was done during the initial surgery to help facilitate drainage  and evacuation of necrotic wound contents.  The wound vac was left in place,  because of the location of the wound and the wound vac, a Foley catheter had  remained in place as well to minimize urinary contamination of the wound  site.   By Mar 05, 2006, the patient's wound remained clean and it was felt that we  may need the vac at discharge and by Mar 06, 2006, she was deemed appropriate for discharge home, but we were unable to get  immediate approval  of the wound vac at discharge.  We were still communicating with the  insurance company, based on their criteria for approving the wound vac and  precare management [facility] was requiring a letter of medical  necessity for vac approval and on March 07, 2006, this letter was dictated  through the [facility] per myself and will be  sent to  the patient's insurance carrier once available.   Because of the inability to continue the wound vac at discharge, i.e. we did  not have insurance approval.  Patient was discharged home with normal saline  packing to the wound.  Foley catheter was out and patient was doing well and  home health already would be following with wet to dry dressings until, we  could hope they get the wound vac approved.  The patient was instructed in  use of a sports bottle for sitz rinses after voiding to minimize urinary  contamination of the wound.  And is noted, we will attempt to obtain wound  vac approval after discharge.   FINAL DIAGNOSES:  1.  Necrotic abscess of the right valva and peroneal area.  2.  Status post intraoperative incision and drainage and debridement of      same.  3.  Uncontrolled diabetes.  4.  Hypertension, borderline.  5.  Obesity.   DISCHARGE MEDICATIONS:  1.  Nystatin powder to affected area twice daily this involves the lower      abdomen and peroneal areas not involving the actual wound.  2.  Glucophage 500 mg b.i.d.  3.  Actos 15 mg daily.  4.  Lantus insulin 34 units at bedtime, new dose.  5.  Amaryl 4 mg daily.  6.  Vicodin 5/500, 1 every 6 hours as needed for pain.   DIET:  Restrictions:  Diabetic.   ACTIVITY:  Increase activity slowly, may shower.   WOUND CARE:  Normal saline with some gauze packed deeply in to the wounds,  cover with dry dressing twice daily if possible.  Home health are willing to  assist.   ADDITIONAL INSTRUCTIONS REGARDING THE WOUND:  Use warm water  and sports  bottle to clean peroneum after each void and as needed.  No powders over or  around the wound area until after dressing secured into place.   HOME HEALTH AGENCY:  [facility].   FOLLOWUP:  She needs to contact to Dr. Hulen Skains or Dr. Deon Pilling to be seen in 2  weeks, at [phone].   She needs to follow up with her primary care physician about her diabetes  especially with multiple changes made in her medication regimen.      Chelan Lissa Merlin, N.P.      Georgina Quint, M.D.  Electronically Signed    ALE/MEDQ  D:  04/07/2006  T:  04/07/2006  Job:  [id number]   cc:   Gwenyth Ober, M.D.  [id number]  [place]   [id number]   [name], M.D.  [phone]   Blane Ohara McDiarmid, M.D.  Fax: [phone]

## 2011-02-22 NOTE — Consult Note (Signed)
NAME:  YANELIZ, GIAMMONA NO.:  [id number]   MEDICAL RECORD NO.:  [id number]          PATIENT TYPE:  INP   LOCATION:  6706                         FACILITY:  [facility]   PHYSICIAN:  Marcello Moores A. Cornett, M.D.DATE OF BIRTH:  1956-04-18   DATE OF CONSULTATION:  02/19/2006  DATE OF DISCHARGE:                                   CONSULTATION   REASON FOR CONSULTATION:  Infection, right groin.   HISTORY OF PRESENT ILLNESS:  The patient is a 55 year old female with a five  day history of swelling in her right labia and right groin.  She was seen by  her primary care doctor.  This was drained and copious amounts  brown pus  were drained about two days ago.  She returned in follow up and had more  swelling of her right labia, and attempt was made to drain this.  She was  noted to have a glucose of over 800.  Sodium 120.  She was nontoxic and was  admitted for severe electrolyte abnormality.  CT scan was obtained of this  region, and some air was noted within the subcutaneous tissues.  I was asked  to see the patient at the request of Dr. McDiarmid in consultation for this.  The patient complained of some pain in her right groin and right thigh, but  otherwise, it was counted as okay.  Denies any nausea or vomiting.  No  recorded fever.  Denies abdominal pain.  Denies any buttock pain.  Most of  her pain is in the inner aspect of her right thigh and right groin.  There  is no radiation to pain.  The pain is moderate in intensity in this region.   PAST MEDICAL HISTORY:  1.  Obesity.  2.  Type 2 diabetes mellitus.   PAST SURGICAL HISTORY:  None.   FAMILY HISTORY:  Positive for diabetes mellitus.   REVIEW OF SYSTEMS:  As stated above.  Otherwise, review of systems negative.   MEDICATIONS:  1.  Vicodin two tablets p.o. q.6 h p.r.n. pain.  2.  Lantus 20 units subcu q.h.s.  3.  Phenergan 25 mg p.o. or IV q.6 h p.r.n.  4.  Rocephin 1 g IV.  5.  Vancomycin per pharmacy.   ALLERGIES:  No known drug allergies.   PHYSICAL EXAMINATION:  VITAL SIGNS:  Blood pressure 44/73, pulse 111,  temperature 97.  GENERAL APPEARANCE:  An obese female, pleasant, no apparent distress.  SKIN:  The right groin is an area of erythema of the inner aspect of her  right groin and right inner thigh.  There is some packing coming from the  open wound.  The erythema  is mild in intensity.  There is no crepitance.  There is no fluctuance that I can feel.  There is not to be any event of  necrosis.  The pus appears to be yellow in color to brown.   DIAGNOSTIC STUDIES:  I reviewed her CT scan.  There is some air in the  subcutaneous tissues corresponding to the incision and drainage site.  I do  not see any  facial air or air in the muscle.   LABORATORY DATA:  White count 10,000.  Sodium 120, glucose 880, BUN 16,  creatinine 39.   IMPRESSION:  1.  Right groin abscess with subsequent cellulitis.  2.  Severe hyponatremia.  3.  Severe hyperglycemia.   PLAN:  At this point in time, I agree with resuscitation and correction of  her severe electrolyte abnormalities.  I do not see any need for emergent  surgical debridement at this point in time until her electrolytes are under  better control.  I do not see any evidence of a necrotizing infection, and I  feel the air on CT is probably secondary to her incision and drainage site  which was done a couple of days ago and packing is still in place.  She may  require this to be open more in the near future depending on how she does as  well as getting her electrolytes corrected and getting her resuscitated and  on appropriate IV antibiotics.  We will follow along with you at this point  in time.  Unless her condition changes, we probably need to correct her  electrolytes first prior to any surgical intervention.   Thank you for this consultation.      Thomas A. Cornett, M.D.  Electronically Signed     TAC/MEDQ  D:  02/19/2006  T:   02/20/2006  Job:  [id number]   cc:   Blane Ohara McDiarmid, M.D.  Fax: [phone]

## 2011-07-10 ENCOUNTER — Encounter
Payer: BC Managed Care – PPO | Attending: Physical Medicine & Rehabilitation | Admitting: Physical Medicine & Rehabilitation

## 2011-07-10 DIAGNOSIS — G545 Neuralgic amyotrophy: Secondary | ICD-10-CM | POA: Insufficient documentation

## 2011-07-10 DIAGNOSIS — E1142 Type 2 diabetes mellitus with diabetic polyneuropathy: Secondary | ICD-10-CM | POA: Insufficient documentation

## 2011-07-10 DIAGNOSIS — E1149 Type 2 diabetes mellitus with other diabetic neurological complication: Secondary | ICD-10-CM | POA: Insufficient documentation

## 2011-07-10 NOTE — Assessment & Plan Note (Signed)
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  She is now progressed quite nicely and is off all assistive devices.  She has strength in the upper legs and tries to exercise regularly.  She is watching her sugars which have been better controlled for the most part. She is noticing some tingling and burning in her legs at nighttime, particularly.  She notices some more when she has been very active during the day.  Her Neurontin was increased to 600 mg t.i.d. over the summer which has helped somewhat.  She uses tramadol at night for her bad evenings.  REVIEW OF SYSTEMS:  Notable for the above.  Full 12-point reviews is in the written health and history section of the chart.  SOCIAL HISTORY:  Unchanged.  She is married, living with her husband.  PHYSICAL EXAMINATION:  VITAL SIGNS:  Blood pressure 149/70, pulse 89, respiratory rate 16, and she is satting 100% on room air. GENERAL:  The patient is pleasant, alert, oriented x3.  Strength is improving at 3+-4/5 at the hips and 5/5 distally.  Sensory exam is diminished still in pinprick and light touch below the knees.  She walks with slightly wide-based gait, but quite stable.  She transfers easily. Cognitively, she is alert and appropriate. HEART:  Regular. CHEST:  Clear. ABDOMEN:  Soft and nontender.  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  PLAN: 1. Continue with Neurontin and Ultram as prescribed.  I think this is     a reasonable regimen.  If she would like, she could resume     nortriptyline which reduced in the past.  She could drive [id number] mg     nightly.  I asked her to consider this in the future if needed.     She can discuss it with her family physician as well. 2. Continue with diet, exercise, and tight diabetes control. 3. I will see her as need in the future.  She has made extremely nice     progress overall.     Meredith Staggers, M.D. Electronically  Signed    ZTS/MedQ D:  07/10/2011 10:40:45  T:  07/10/2011 12:54:44  Job #:  [id number]  cc:   Alyson Locket. Love, M.D. Fax: [phone]  Margaretmary Eddy, MD Fax: [phone]

## 2011-08-20 ENCOUNTER — Other Ambulatory Visit: Payer: Self-pay | Admitting: Family Medicine

## 2011-08-20 DIAGNOSIS — Z1231 Encounter for screening mammogram for malignant neoplasm of breast: Secondary | ICD-10-CM

## 2011-12-23 ENCOUNTER — Ambulatory Visit
Admission: RE | Admit: 2011-12-23 | Discharge: 2011-12-23 | Disposition: A | Discharge status: Home / Self Care | Payer: BC Managed Care – PPO | Source: Ambulatory Visit | Attending: Family Medicine | Admitting: Family Medicine

## 2011-12-23 DIAGNOSIS — Z1231 Encounter for screening mammogram for malignant neoplasm of breast: Secondary | ICD-10-CM

## 2012-03-05 LAB — HEMOGLOBIN A1C: Hgb A1c MFr Bld: 7.7 % — AB (ref 4.0–6.0)

## 2012-03-05 LAB — LIPID PANEL
Cholesterol: 155 mg/dL (ref 0–200)
HDL: 37 mg/dL (ref 35–70)
LDL Cholesterol: 85 mg/dL

## 2012-05-29 ENCOUNTER — Encounter: Payer: Self-pay | Admitting: *Deleted

## 2012-05-29 ENCOUNTER — Encounter: Payer: Self-pay | Admitting: Family Medicine

## 2012-06-01 ENCOUNTER — Ambulatory Visit (INDEPENDENT_AMBULATORY_CARE_PROVIDER_SITE_OTHER): Payer: BC Managed Care – PPO | Admitting: Family Medicine

## 2012-06-01 ENCOUNTER — Encounter: Payer: Self-pay | Admitting: Family Medicine

## 2012-06-01 VITALS — BP 148/81 | HR 86 | Temp 97.3°F | Resp 17 | Ht 68.0 in | Wt 270.0 lb

## 2012-06-01 DIAGNOSIS — E7800 Pure hypercholesterolemia, unspecified: Secondary | ICD-10-CM

## 2012-06-01 DIAGNOSIS — E559 Vitamin D deficiency, unspecified: Secondary | ICD-10-CM

## 2012-06-01 DIAGNOSIS — E119 Type 2 diabetes mellitus without complications: Secondary | ICD-10-CM

## 2012-06-01 DIAGNOSIS — H409 Unspecified glaucoma: Secondary | ICD-10-CM

## 2012-06-01 DIAGNOSIS — G629 Polyneuropathy, unspecified: Secondary | ICD-10-CM

## 2012-06-01 DIAGNOSIS — I10 Essential (primary) hypertension: Secondary | ICD-10-CM

## 2012-06-01 LAB — COMPREHENSIVE METABOLIC PANEL
ALT: 13 U/L (ref 0–35)
AST: 16 U/L (ref 0–37)
Alkaline Phosphatase: 101 U/L (ref 39–117)
Calcium: 9.4 mg/dL (ref 8.4–10.5)
Chloride: 105 mEq/L (ref 96–112)
Creat: 0.83 mg/dL (ref 0.50–1.10)
Potassium: 4.9 mEq/L (ref 3.5–5.3)

## 2012-06-01 LAB — CBC WITH DIFFERENTIAL/PLATELET
Basophils Absolute: 0 10*3/uL (ref 0.0–0.1)
Basophils Relative: 0 % (ref 0–1)
Lymphocytes Relative: 29 % (ref 12–46)
Neutro Abs: 4.6 10*3/uL (ref 1.7–7.7)
Platelets: 244 10*3/uL (ref 150–400)
RDW: 14.4 % (ref 11.5–15.5)
WBC: 7.2 10*3/uL (ref 4.0–10.5)

## 2012-06-01 LAB — LIPID PANEL
LDL Cholesterol: 78 mg/dL (ref 0–99)
Total CHOL/HDL Ratio: 4.3 Ratio

## 2012-06-01 LAB — POCT GLYCOSYLATED HEMOGLOBIN (HGB A1C): Hemoglobin A1C: 6.7

## 2012-06-01 NOTE — Progress Notes (Signed)
Subjective:    Patient ID: Darliss Cheney, female    DOB: June 26, 1956, 56 y.o.   MRN: [id number]  HPIThis 56 y.o. female presents for three month follow-up for:   1.  DMII;  Three month follow-up for DMII. Last HgbA1c of 7.7.  No change in medications made at last visit.  Fasting sugars 80-130; no post-prandial sugars.  Ran out of medication four days ago.    Has been taking husband's Metformin.   Denies polyuria, polydipsia, nocturia, weight changes.  Has not altered diet significantly since last visit.  2. HTN:  Three month follow-up for hypertension.  Home blood pressures not checking.  Denies chest pain, palpitations, shortness of breath, +leg swelling persistent over the summer.  Has not purchased compression stockings.  Denies chest pain, palpitations, shortness of breath.  3.  Hyperlipidemia:  Three month follow-up for lipids.  Last labs three months ago.  LDL of 85. Reports good compliance with medicaiton, good toelrance with medication, good symptom control.  Denies chest pain, palpitations, shortness of breath, dizziness, focal weakness, numbness, tingling other than peripheral neuropathy symptoms.  4. Peripheral Neuropathy: stable without change since last visit; no recent neurology follow-up due to stability of symptoms; needs refills.    5. Vitamin D Deficiency:  Started taking daily Vitamin D 600 IU after last visit; due for repeat labs.     Review of Systems  Constitutional: Negative for fever, chills, diaphoresis, fatigue and unexpected weight change.  Respiratory: Negative for cough, chest tightness, shortness of breath and stridor.   Cardiovascular: Positive for leg swelling. Negative for chest pain and palpitations.  Skin: Negative for color change, pallor, rash and wound.  Neurological: Positive for weakness and numbness. Negative for dizziness, tremors, seizures, syncope, facial asymmetry, speech difficulty, light-headedness and headaches.        Past Medical History    Diagnosis Date  . Pure hypercholesterolemia   . Obesity, unspecified   . Unspecified vitamin D deficiency   . Pure hypercholesterolemia   . Type II or unspecified type diabetes mellitus without mention of complication, not stated as uncontrolled   . Elevated blood pressure reading without diagnosis of hypertension   . Neuropathy 2011  . Chicken pox     Past Surgical History  Procedure Date  . Lumbar back surgery 1996    L4-5  . Groin abscess 2007    I & D with ICU stay due to sepsis.  . Gallbladder surgery 2011  . Total abdominal hysterectomy 01/2011  . Bilateral oophorectomy 01/2011    ovarian cyst benign    Prior to Admission medications   Medication Sig Start Date End Date Taking? Authorizing Provider  aspirin EC 81 MG tablet Take 81 mg by mouth daily.   Yes Historical Provider, MD  cholecalciferol (VITAMIN D) 1000 UNITS tablet Take 1,000 Units by mouth daily.   Yes Historical Provider, MD  cyclobenzaprine (FLEXERIL) 10 MG tablet Take 10 mg by mouth at bedtime as needed.   Yes Historical Provider, MD  gabapentin (NEURONTIN) 600 MG tablet Take 600 mg by mouth 3 (three) times daily.   Yes Historical Provider, MD  glimepiride (AMARYL) 4 MG tablet Take 4 mg by mouth 2 (two) times daily before a meal.   Yes Historical Provider, MD  glucose blood test strip 1 each by Other route as needed. Use as instructed   Yes Historical Provider, MD  Lancets MISC by Does not apply route.   Yes Historical Provider, MD  metFORMIN (GLUCOPHAGE) 1000 MG  tablet Take 1,000 mg by mouth 2 (two) times daily with a meal.   Yes Historical Provider, MD  nortriptyline (PAMELOR) 50 MG capsule Take 50 mg by mouth at bedtime.   Yes Historical Provider, MD  pravastatin (PRAVACHOL) 20 MG tablet Take 20 mg by mouth at bedtime.   Yes Historical Provider, MD  traMADol (ULTRAM) 50 MG tablet Take 50 mg by mouth every 6 (six) hours as needed.   Yes Historical Provider, MD    No Known Allergies  History   Social  History  . Marital Status: Married    Spouse Name: N/A    Number of Children: 2  . Years of Education: 12   Occupational History  . disabled     03/2010 for peripheral neuropathy  . home daycare     x 20 yrs.   Social History Main Topics  . Smoking status: Never Smoker   . Smokeless tobacco: Not on file  . Alcohol Use: No  . Drug Use: No  . Sexually Active: Not on file   Other Topics Concern  . Not on file   Social History Narrative   Married x 38 yrs. Happily married, no abuse. Has 2 children: daughter 37, son 64. One grandson and 2 step grandchildren. Daughter and grandson live in home with pt and spouse. Pets: dog. Exercise: Inactive.Always uses seat belts, smoke detectors in home.No guns in the home. Caffeine use: 2 cups coffee per day. Nutrition: Well balanced diet.    Family History  Problem Relation Age of Onset  . Diabetes Mother   . Cancer Father     prostate,colon,skin,lymph node  . Obesity Brother     Objective:   Physical Exam  Nursing note and vitals reviewed. Constitutional: She is oriented to person, place, and time. She appears well-developed and well-nourished. No distress.  HENT:  Head: Normocephalic and atraumatic.  Eyes: Conjunctivae and EOM are normal. Pupils are equal, round, and reactive to light.  Neck: Normal range of motion. Neck supple. No thyromegaly present.  Cardiovascular: Normal rate, regular rhythm and intact distal pulses.  Exam reveals no gallop and no friction rub.   No murmur heard.      +varicosities BLE L>R.  +Trace pitting edema BLE.  Pulmonary/Chest: Effort normal and breath sounds normal.  Lymphadenopathy:    She has no cervical adenopathy.  Neurological: She is alert and oriented to person, place, and time. No cranial nerve deficit. She exhibits normal muscle tone. Coordination normal.  Skin: Skin is warm and dry. No rash noted. She is not diaphoretic. No erythema. No pallor.  Psychiatric: She has a normal mood and affect.  Her behavior is normal. Judgment and thought content normal.      Assessment & Plan:   1. Diabetes mellitus  POCT glycosylated hemoglobin (Hb A1C)  2. Essential hypertension, benign  CBC with Differential, Comprehensive metabolic panel  3. Pure hypercholesterolemia  Lipid panel  4. Peripheral neuropathy    5. Vitamin d deficiency  Vitamin D 25 hydroxy    1.  DMII: uncontrolled at last visit with improvement today; no change in medications; obtain labs.  Refills provided. 2.  Blood pressure elevated:  Persistent; recommend checking BP at home; history of white coat syndrome. 3.  Hyperlipidemia: controlled; no change in medications; obtain labs; refills provided. 4.  Peripheral Neuropathy: Stable; no change in medications; refills provided; will warrant follow-up with neurology if clinically declines. 5.  Vitamin D deficiency:  Uncontrolled; tolerating OTC supplementation; obtain labs.

## 2012-06-01 NOTE — Patient Instructions (Addendum)
1. Diabetes mellitus   2. Essential hypertension, benign   3. Pure hypercholesterolemia   4. Peripheral neuropathy   5. Vitamin d deficiency

## 2012-06-02 LAB — VITAMIN D 25 HYDROXY (VIT D DEFICIENCY, FRACTURES): Vit D, 25-Hydroxy: 32 ng/mL (ref 30–89)

## 2012-06-05 MED ORDER — TRAMADOL HCL 50 MG PO TABS: 50.0000 mg | ORAL_TABLET | Freq: Four times a day (QID) | ORAL | Status: DC | PRN

## 2012-06-05 MED ORDER — CYCLOBENZAPRINE HCL 10 MG PO TABS: 10.0000 mg | ORAL_TABLET | Freq: Every evening | ORAL | Status: DC | PRN

## 2012-06-05 MED ORDER — PRAVASTATIN SODIUM 20 MG PO TABS: 20.0000 mg | ORAL_TABLET | Freq: Every day | ORAL | Status: DC

## 2012-06-05 MED ORDER — GABAPENTIN 600 MG PO TABS: 600.0000 mg | ORAL_TABLET | Freq: Three times a day (TID) | ORAL | Status: DC

## 2012-06-05 MED ORDER — NORTRIPTYLINE HCL 50 MG PO CAPS: 50.0000 mg | ORAL_CAPSULE | Freq: Every day | ORAL | Status: DC

## 2012-06-05 MED ORDER — GLIMEPIRIDE 4 MG PO TABS: 4.0000 mg | ORAL_TABLET | Freq: Two times a day (BID) | ORAL | Status: DC

## 2012-06-05 MED ORDER — METFORMIN HCL 1000 MG PO TABS: 1000.0000 mg | ORAL_TABLET | Freq: Two times a day (BID) | ORAL | Status: DC

## 2012-06-06 NOTE — Progress Notes (Signed)
Reviewed and agree.

## 2012-07-07 HISTORY — PX: CORONARY ANGIOPLASTY WITH STENT PLACEMENT: SHX49

## 2012-07-20 ENCOUNTER — Ambulatory Visit (INDEPENDENT_AMBULATORY_CARE_PROVIDER_SITE_OTHER): Payer: BC Managed Care – PPO | Admitting: Family Medicine

## 2012-07-20 ENCOUNTER — Ambulatory Visit: Payer: BC Managed Care – PPO

## 2012-07-20 ENCOUNTER — Inpatient Hospital Stay (HOSPITAL_COMMUNITY)
Admission: EM | Admit: 2012-07-20 | Discharge: 2012-07-26 | DRG: 550 | Disposition: A | Discharge status: Home / Self Care | Payer: BC Managed Care – PPO | Attending: Family Medicine | Admitting: Family Medicine

## 2012-07-20 ENCOUNTER — Encounter (HOSPITAL_COMMUNITY): Payer: Self-pay

## 2012-07-20 ENCOUNTER — Emergency Department (HOSPITAL_COMMUNITY): Payer: BC Managed Care – PPO

## 2012-07-20 VITALS — BP 122/82 | HR 116 | Temp 98.7°F | Resp 18

## 2012-07-20 DIAGNOSIS — R0600 Dyspnea, unspecified: Secondary | ICD-10-CM

## 2012-07-20 DIAGNOSIS — E1169 Type 2 diabetes mellitus with other specified complication: Secondary | ICD-10-CM | POA: Diagnosis present

## 2012-07-20 DIAGNOSIS — Z955 Presence of coronary angioplasty implant and graft: Secondary | ICD-10-CM

## 2012-07-20 DIAGNOSIS — I2582 Chronic total occlusion of coronary artery: Secondary | ICD-10-CM | POA: Diagnosis present

## 2012-07-20 DIAGNOSIS — R0601 Orthopnea: Secondary | ICD-10-CM

## 2012-07-20 DIAGNOSIS — Z90710 Acquired absence of both cervix and uterus: Secondary | ICD-10-CM

## 2012-07-20 DIAGNOSIS — R609 Edema, unspecified: Secondary | ICD-10-CM

## 2012-07-20 DIAGNOSIS — E785 Hyperlipidemia, unspecified: Secondary | ICD-10-CM | POA: Diagnosis present

## 2012-07-20 DIAGNOSIS — Z79899 Other long term (current) drug therapy: Secondary | ICD-10-CM

## 2012-07-20 DIAGNOSIS — I5023 Acute on chronic systolic (congestive) heart failure: Secondary | ICD-10-CM

## 2012-07-20 DIAGNOSIS — Z66 Do not resuscitate: Secondary | ICD-10-CM | POA: Diagnosis not present

## 2012-07-20 DIAGNOSIS — R0902 Hypoxemia: Secondary | ICD-10-CM | POA: Diagnosis not present

## 2012-07-20 DIAGNOSIS — I2589 Other forms of chronic ischemic heart disease: Secondary | ICD-10-CM | POA: Diagnosis present

## 2012-07-20 DIAGNOSIS — Z23 Encounter for immunization: Secondary | ICD-10-CM

## 2012-07-20 DIAGNOSIS — I219 Acute myocardial infarction, unspecified: Secondary | ICD-10-CM | POA: Diagnosis present

## 2012-07-20 DIAGNOSIS — Z9079 Acquired absence of other genital organ(s): Secondary | ICD-10-CM

## 2012-07-20 DIAGNOSIS — Z7982 Long term (current) use of aspirin: Secondary | ICD-10-CM

## 2012-07-20 DIAGNOSIS — E559 Vitamin D deficiency, unspecified: Secondary | ICD-10-CM | POA: Diagnosis present

## 2012-07-20 DIAGNOSIS — I214 Non-ST elevation (NSTEMI) myocardial infarction: Secondary | ICD-10-CM

## 2012-07-20 DIAGNOSIS — I2510 Atherosclerotic heart disease of native coronary artery without angina pectoris: Secondary | ICD-10-CM

## 2012-07-20 DIAGNOSIS — G8929 Other chronic pain: Secondary | ICD-10-CM | POA: Diagnosis present

## 2012-07-20 DIAGNOSIS — I5021 Acute systolic (congestive) heart failure: Principal | ICD-10-CM

## 2012-07-20 DIAGNOSIS — R0602 Shortness of breath: Secondary | ICD-10-CM

## 2012-07-20 DIAGNOSIS — E669 Obesity, unspecified: Secondary | ICD-10-CM

## 2012-07-20 DIAGNOSIS — I447 Left bundle-branch block, unspecified: Secondary | ICD-10-CM

## 2012-07-20 DIAGNOSIS — J90 Pleural effusion, not elsewhere classified: Secondary | ICD-10-CM

## 2012-07-20 DIAGNOSIS — E119 Type 2 diabetes mellitus without complications: Secondary | ICD-10-CM

## 2012-07-20 DIAGNOSIS — I509 Heart failure, unspecified: Secondary | ICD-10-CM

## 2012-07-20 DIAGNOSIS — Z6841 Body Mass Index (BMI) 40.0 and over, adult: Secondary | ICD-10-CM

## 2012-07-20 LAB — POCT CBC
Granulocyte percent: 72.3 % (ref 37–80)
HCT, POC: 38.1 % (ref 37.7–47.9)
Hemoglobin: 11.7 g/dL — AB (ref 12.2–16.2)
Lymph, poc: 2 (ref 0.6–3.4)
MCH, POC: 27.2 pg (ref 27–31.2)
MCHC: 30.7 g/dL — AB (ref 31.8–35.4)
MCV: 88.7 fL (ref 80–97)
MID (cbc): 0.4 (ref 0–0.9)
MPV: 9 fL (ref 0–99.8)
POC Granulocyte: 6.4 (ref 2–6.9)
POC LYMPH PERCENT: 22.7 % (ref 10–50)
POC MID %: 5 % (ref 0–12)
Platelet Count, POC: 270 10*3/uL (ref 142–424)
RBC: 4.3 M/uL (ref 4.04–5.48)
RDW, POC: 14.9 %
WBC: 8.8 10*3/uL (ref 4.6–10.2)

## 2012-07-20 LAB — CBC
HCT: 39.1 % (ref 36.0–46.0)
Hemoglobin: 12.8 g/dL (ref 12.0–15.0)
MCH: 27.9 pg (ref 26.0–34.0)
MCHC: 32.7 g/dL (ref 30.0–36.0)
MCV: 85.2 fL (ref 78.0–100.0)

## 2012-07-20 LAB — BASIC METABOLIC PANEL
BUN: 18 mg/dL (ref 6–23)
Calcium: 9.2 mg/dL (ref 8.4–10.5)
Creatinine, Ser: 1.03 mg/dL (ref 0.50–1.10)
GFR calc non Af Amer: 59 mL/min — ABNORMAL LOW (ref >90)
Glucose, Bld: 239 mg/dL — ABNORMAL HIGH (ref 70–99)
Sodium: 138 mEq/L (ref 135–145)

## 2012-07-20 LAB — PRO B NATRIURETIC PEPTIDE: Pro B Natriuretic peptide (BNP): 5006 pg/mL — ABNORMAL HIGH (ref 0–125)

## 2012-07-20 MED ORDER — ASPIRIN 81 MG PO CHEW
324.0000 mg | CHEWABLE_TABLET | Freq: Once | ORAL | Status: AC
  Administered 2012-07-20: 324 mg via ORAL
  Filled 2012-07-20: qty 4

## 2012-07-20 MED ORDER — NITROGLYCERIN 0.4 MG SL SUBL
0.4000 mg | SUBLINGUAL_TABLET | SUBLINGUAL | Status: DC | PRN
  Filled 2012-07-20: qty 25

## 2012-07-20 MED ORDER — FUROSEMIDE 10 MG/ML IJ SOLN
40.0000 mg | Freq: Once | INTRAMUSCULAR | Status: AC
  Administered 2012-07-20: 40 mg via INTRAVENOUS
  Filled 2012-07-20: qty 4

## 2012-07-20 NOTE — Progress Notes (Signed)
 [facility]:  Office Visit  Chief Complaint:  Chief Complaint  Patient presents with  . Shortness of Breath    for three days and became worse today    HPI: [name] is a 56 y.o. female who complains of  SOB x 1 week , worse today. SOB at rest and DOE. Denies chest pain or sick sxs.  Has had  edema. No prior h/o CAD or CHF. She normally weighs  273 lbs. Today's weight was not obtained but she feels like she has more swelling. + abdominal pain.Deneis CP.  Patient has a PMH of DM, and hyperlipidemia.   Past Medical History  Diagnosis Date  . Pure hypercholesterolemia   . Obesity, unspecified   . Unspecified vitamin D deficiency   . Pure hypercholesterolemia   . Type II or unspecified type diabetes mellitus without mention of complication, not stated as uncontrolled   . Elevated blood pressure reading without diagnosis of hypertension   . Neuropathy 2011  . Chicken pox    Past Surgical History  Procedure Date  . Lumbar back surgery 1996    L4-5  . Groin abscess 2007    I & D with ICU stay due to sepsis.  . Gallbladder surgery 2011  . Total abdominal hysterectomy 01/2011  . Bilateral oophorectomy 01/2011    ovarian cyst benign   History   Social History  . Marital Status: Married    Spouse Name: N/A    Number of Children: 2  . Years of Education: 12   Occupational History  . disabled     03/2010 for peripheral neuropathy  . home daycare     x 20 yrs.   Social History Main Topics  . Smoking status: Never Smoker   . Smokeless tobacco: None  . Alcohol Use: No  . Drug Use: No  . Sexually Active: None   Other Topics Concern  . None   Social History Narrative   Married x 38 yrs. Happily married, no abuse. Has 2 children: daughter 53, son 57. One grandson and 2 step grandchildren. Daughter and grandson live in home with pt and spouse. Pets: dog. Exercise: Inactive.Always uses seat belts, smoke detectors in home.No guns in the home.  Caffeine use: 2 cups coffee per day. Nutrition: Well balanced diet.   Family History  Problem Relation Age of Onset  . Diabetes Mother   . Cancer Father     prostate,colon,skin,lymph node  . Obesity Brother    No Known Allergies Prior to Admission medications   Medication Sig Start Date End Date Taking? Authorizing Provider  aspirin EC 81 MG tablet Take 81 mg by mouth daily.   Yes Historical Provider, MD  cholecalciferol (VITAMIN D) 1000 UNITS tablet Take 1,000 Units by mouth daily.   Yes Historical Provider, MD  cyclobenzaprine (FXERIL) 10 MG tablet Take 1 tablet (10 mg total) by mouth at bedtime as needed. 06/05/12  Yes Wardell Honour, MD  gabapentin (NEURONTIN) 600 MG tablet Take 1 tablet (600 mg total) by mouth 3 (three) times daily. 06/05/12  Yes Wardell Honour, MD  glimepiride (AMARYL) 4 MG tablet Take 1 tablet (4 mg total) by mouth 2 (two) times daily before a meal. 06/05/12  Yes Wardell Honour, MD  glucose blood test strip 1 each by Other route as needed. Use as instructed   Yes Historical Provider, MD  Lancets MISC by Does not apply route.   Yes Historical Provider, MD  metFORMIN (  GLUCOPHAGE) 1000 MG tablet Take 1 tablet (1,000 mg total) by mouth 2 (two) times daily with a meal. 06/05/12  Yes Wardell Honour, MD  nortriptyline (PAMELOR) 50 MG capsule Take 1 capsule (50 mg total) by mouth at bedtime. 06/05/12  Yes Wardell Honour, MD  pravastatin (PRAVACHOL) 20 MG tablet Take 1 tablet (20 mg total) by mouth at bedtime. 06/05/12  Yes Wardell Honour, MD  traMADol (ULTRAM) 50 MG tablet Take 1 tablet (50 mg total) by mouth every 6 (six) hours as needed. 06/05/12  Yes Wardell Honour, MD     ROS: The patient denies fevers, chills, night sweats, unintentional weight loss, chest pain, nausea, vomiting, dysuria, hematuria, melena, numbness, weakness, or tingling.   All other systems have been reviewed and were otherwise negative with the exception of those mentioned in the HPI and as above.     PHYSICAL EXAM: Filed Vitals:   07/20/12 1844  BP: 122/82  Pulse: 116  Temp: 98.7 F (37.1 C)  Resp: 18   There were no vitals filed for this visit. There is no height or weight on file to calculate BMI.  General: Alert, mild  Distress, obese HEENT:  Normocephalic, atraumatic, oropharynx patent.  Cardiovascular:  Sinus tach, no rubs murmurs or gallops.  Distant heart sounds.No Carotid bruits, radial pulse intact.  NO JVD. +  edema Respiratory: Difficulty breathing  No wheezes, rales, or rhonchi.  No cyanosis, + use of accessory musculature GI: No organomegaly, abdomen is soft and non-tender, positive bowel sounds.  No masses. Skin: No rashes. Neurologic: Facial musculature symmetric. Psychiatric: Patient is appropriate throughout our interaction. Lymphatic: No cervical lymphadenopathy Musculoskeletal: Gait intact.   LABS: Results for orders placed in visit on 07/20/12  POCT CBC      Component Value Range   WBC 8.8  4.6 - 10.2 K/uL   Lymph, poc 2.0  0.6 - 3.4   POC LYMPH PERCENT 22.7  10 - 50 %L   MID (cbc) 0.4  0 - 0.9   POC MID % 5.0  0 - 12 %M   POC Granulocyte 6.4  2 - 6.9   Granulocyte percent 72.3  37 - 80 %G   RBC 4.30  4.04 - 5.48 M/uL   Hemoglobin 11.7 (*) 12.2 - 16.2 g/dL   HCT, POC 38.1  37.7 - 47.9 %   MCV 88.7  80 - 97 fL   MCH, POC 27.2  27 - 31.2 pg   MCHC 30.7 (*) 31.8 - 35.4 g/dL   RDW, POC 14.9     Platelet Count, POC 270  142 - 424 K/uL   MPV 9.0  0 - 99.8 fL     EKG/XRAY:   Primary read interpreted by Dr. Marin Comment at [facility]. Bilateral pleural effusion LBBB, ST depression     ASSESSMENT/PLAN: Encounter Diagnoses  Name Primary?  . SOB (shortness of breath) Yes  . Bilateral pleural effusion    Patient has no history of CHF, has had SOB/DOE in last 1 week worse in last  3 days. +  edema, ? Weight gain. Diabetic with abdominal pain with questionable new vs old LBBB.  Sending to [facility] ER for further evaluation. Spoke with Dr.  Wadie Lessen, [facility], DO 07/20/2012 9:29 PM

## 2012-07-21 ENCOUNTER — Emergency Department (HOSPITAL_COMMUNITY): Payer: BC Managed Care – PPO

## 2012-07-21 ENCOUNTER — Encounter (HOSPITAL_COMMUNITY): Payer: Self-pay | Admitting: *Deleted

## 2012-07-21 ENCOUNTER — Encounter (HOSPITAL_COMMUNITY)
Admission: EM | Disposition: A | Discharge status: Home / Self Care | Payer: Self-pay | Source: Home / Self Care | Attending: Family Medicine

## 2012-07-21 DIAGNOSIS — I5032 Chronic diastolic (congestive) heart failure: Secondary | ICD-10-CM | POA: Insufficient documentation

## 2012-07-21 DIAGNOSIS — I214 Non-ST elevation (NSTEMI) myocardial infarction: Secondary | ICD-10-CM

## 2012-07-21 DIAGNOSIS — I059 Rheumatic mitral valve disease, unspecified: Secondary | ICD-10-CM

## 2012-07-21 DIAGNOSIS — I5021 Acute systolic (congestive) heart failure: Secondary | ICD-10-CM

## 2012-07-21 DIAGNOSIS — I2510 Atherosclerotic heart disease of native coronary artery without angina pectoris: Secondary | ICD-10-CM

## 2012-07-21 DIAGNOSIS — R0902 Hypoxemia: Secondary | ICD-10-CM | POA: Diagnosis present

## 2012-07-21 DIAGNOSIS — E785 Hyperlipidemia, unspecified: Secondary | ICD-10-CM | POA: Insufficient documentation

## 2012-07-21 DIAGNOSIS — I509 Heart failure, unspecified: Secondary | ICD-10-CM

## 2012-07-21 HISTORY — PX: LEFT HEART CATHETERIZATION WITH CORONARY ANGIOGRAM: SHX5451

## 2012-07-21 HISTORY — DX: Non-ST elevation (NSTEMI) myocardial infarction: I21.4

## 2012-07-21 HISTORY — DX: Acute systolic (congestive) heart failure: I50.21

## 2012-07-21 LAB — COMPREHENSIVE METABOLIC PANEL
Albumin: 4.1 g/dL (ref 3.5–5.2)
CO2: 23 mEq/L (ref 19–32)
Calcium: 8.8 mg/dL (ref 8.4–10.5)
Chloride: 105 mEq/L (ref 96–112)
Glucose, Bld: 252 mg/dL — ABNORMAL HIGH (ref 70–99)
Potassium: 4.7 mEq/L (ref 3.5–5.3)
Sodium: 139 mEq/L (ref 135–145)
Total Protein: 7 g/dL (ref 6.0–8.3)

## 2012-07-21 LAB — MRSA PCR SCREENING: MRSA by PCR: NEGATIVE

## 2012-07-21 LAB — POCT I-STAT 3, VENOUS BLOOD GAS (G3P V)
Acid-base deficit: 1 mmol/L (ref 0.0–2.0)
Bicarbonate: 23.4 mEq/L (ref 20.0–24.0)
Bicarbonate: 24.4 mEq/L — ABNORMAL HIGH (ref 20.0–24.0)
O2 Saturation: 57 %
TCO2: 24 mmol/L (ref 0–100)
TCO2: 26 mmol/L (ref 0–100)
pCO2, Ven: 37.8 mmHg — ABNORMAL LOW (ref 45.0–50.0)
pH, Ven: 7.418 — ABNORMAL HIGH (ref 7.250–7.300)
pO2, Ven: 29 mmHg — CL (ref 30.0–45.0)

## 2012-07-21 LAB — CBC
HCT: 39.1 % (ref 36.0–46.0)
HCT: 36.1 % (ref 36.0–46.0)
Hemoglobin: 13.2 g/dL (ref 12.0–15.0)
MCH: 28.6 pg (ref 26.0–34.0)
MCHC: 33.8 g/dL (ref 30.0–36.0)
MCHC: 32.4 g/dL (ref 30.0–36.0)
RDW: 13.8 % (ref 11.5–15.5)

## 2012-07-21 LAB — COMPREHENSIVE METABOLIC PANEL WITH GFR
ALT: 8 U/L (ref 0–35)
AST: 12 U/L (ref 0–37)
Alkaline Phosphatase: 96 U/L (ref 39–117)
BUN: 17 mg/dL (ref 6–23)
Creat: 1.09 mg/dL (ref 0.50–1.10)
Total Bilirubin: 0.3 mg/dL (ref 0.3–1.2)

## 2012-07-21 LAB — POCT I-STAT 3, ART BLOOD GAS (G3+)
Bicarbonate: 22.7 mEq/L (ref 20.0–24.0)
O2 Saturation: 90 %
TCO2: 24 mmol/L (ref 0–100)
pCO2 arterial: 32.9 mmHg — ABNORMAL LOW (ref 35.0–45.0)
pO2, Arterial: 56 mmHg — ABNORMAL LOW (ref 80.0–100.0)

## 2012-07-21 LAB — LIPID PANEL
Cholesterol: 121 mg/dL (ref 0–200)
HDL: 34 mg/dL — ABNORMAL LOW (ref >39)
Total CHOL/HDL Ratio: 3.6 RATIO

## 2012-07-21 LAB — TROPONIN I: Troponin I: 0.79 ng/mL (ref <0.30)

## 2012-07-21 LAB — GLUCOSE, CAPILLARY
Glucose-Capillary: 189 mg/dL — ABNORMAL HIGH (ref 70–99)
Glucose-Capillary: 208 mg/dL — ABNORMAL HIGH (ref 70–99)

## 2012-07-21 LAB — CK TOTAL AND CKMB (NOT AT ARMC): Relative Index: INVALID (ref 0.0–2.5)

## 2012-07-21 LAB — CREATININE, SERUM: GFR calc non Af Amer: 66 mL/min — ABNORMAL LOW (ref >90)

## 2012-07-21 LAB — MAGNESIUM: Magnesium: 1.5 mg/dL (ref 1.5–2.5)

## 2012-07-21 SURGERY — LEFT HEART CATHETERIZATION WITH CORONARY ANGIOGRAM
Anesthesia: LOCAL

## 2012-07-21 MED ORDER — ASPIRIN EC 81 MG PO TBEC
81.0000 mg | DELAYED_RELEASE_TABLET | Freq: Every day | ORAL | Status: DC
  Administered 2012-07-21 – 2012-07-26 (×6): 81 mg via ORAL
  Filled 2012-07-21 (×6): qty 1

## 2012-07-21 MED ORDER — HEPARIN BOLUS VIA INFUSION
4000.0000 [IU] | Freq: Once | INTRAVENOUS | Status: AC
  Administered 2012-07-21: 4000 [IU] via INTRAVENOUS
  Filled 2012-07-21: qty 4000

## 2012-07-21 MED ORDER — NORTRIPTYLINE HCL 25 MG PO CAPS
50.0000 mg | ORAL_CAPSULE | Freq: Every day | ORAL | Status: DC
  Administered 2012-07-21 – 2012-07-25 (×5): 50 mg via ORAL
  Filled 2012-07-21 (×6): qty 2

## 2012-07-21 MED ORDER — MIDAZOLAM HCL 2 MG/2ML IJ SOLN
INTRAMUSCULAR | Status: AC
  Filled 2012-07-21: qty 2

## 2012-07-21 MED ORDER — SIMVASTATIN 20 MG PO TABS
20.0000 mg | ORAL_TABLET | Freq: Every day | ORAL | Status: DC
  Administered 2012-07-21 – 2012-07-25 (×5): 20 mg via ORAL
  Filled 2012-07-21 (×6): qty 1

## 2012-07-21 MED ORDER — HEPARIN (PORCINE) IN NACL 2-0.9 UNIT/ML-% IJ SOLN
INTRAMUSCULAR | Status: AC
  Filled 2012-07-21: qty 1000

## 2012-07-21 MED ORDER — SODIUM CHLORIDE 0.9 % IJ SOLN: 3.0000 mL | INTRAMUSCULAR | Status: DC | PRN

## 2012-07-21 MED ORDER — SODIUM CHLORIDE 0.9 % IV SOLN: 250.0000 mL | INTRAVENOUS | Status: DC | PRN

## 2012-07-21 MED ORDER — IOHEXOL 350 MG/ML SOLN
100.0000 mL | Freq: Once | INTRAVENOUS | Status: AC | PRN
  Administered 2012-07-21: 100 mL via INTRAVENOUS

## 2012-07-21 MED ORDER — DIGOXIN 125 MCG PO TABS
0.1250 mg | ORAL_TABLET | Freq: Every day | ORAL | Status: DC
  Administered 2012-07-21 – 2012-07-26 (×6): 0.125 mg via ORAL
  Filled 2012-07-21 (×7): qty 1

## 2012-07-21 MED ORDER — INSULIN ASPART 100 UNIT/ML ~~LOC~~ SOLN
0.0000 [IU] | Freq: Three times a day (TID) | SUBCUTANEOUS | Status: DC
  Administered 2012-07-21 – 2012-07-22 (×2): 2 [IU] via SUBCUTANEOUS
  Administered 2012-07-22: 7 [IU] via SUBCUTANEOUS

## 2012-07-21 MED ORDER — PERFLUTREN LIPID MICROSPHERE
1.0000 mL | INTRAVENOUS | Status: AC | PRN
  Administered 2012-07-21 (×2): 2 mL via INTRAVENOUS
  Filled 2012-07-21: qty 10

## 2012-07-21 MED ORDER — ASPIRIN 81 MG PO CHEW
CHEWABLE_TABLET | ORAL | Status: AC
  Filled 2012-07-21: qty 4

## 2012-07-21 MED ORDER — SPIRONOLACTONE 12.5 MG HALF TABLET
12.5000 mg | ORAL_TABLET | Freq: Every day | ORAL | Status: DC
  Administered 2012-07-21 – 2012-07-24 (×4): 12.5 mg via ORAL
  Filled 2012-07-21 (×4): qty 1

## 2012-07-21 MED ORDER — CYCLOBENZAPRINE HCL 10 MG PO TABS
10.0000 mg | ORAL_TABLET | Freq: Every evening | ORAL | Status: DC | PRN
  Administered 2012-07-22: 10 mg via ORAL
  Filled 2012-07-21: qty 1

## 2012-07-21 MED ORDER — FUROSEMIDE 10 MG/ML IJ SOLN
40.0000 mg | Freq: Two times a day (BID) | INTRAMUSCULAR | Status: DC
  Administered 2012-07-21 – 2012-07-22 (×3): 40 mg via INTRAVENOUS
  Filled 2012-07-21 (×6): qty 4

## 2012-07-21 MED ORDER — SODIUM CHLORIDE 0.9 % IV SOLN
INTRAVENOUS | Status: AC
  Administered 2012-07-21: 16:00:00 via INTRAVENOUS

## 2012-07-21 MED ORDER — ENOXAPARIN SODIUM 40 MG/0.4ML ~~LOC~~ SOLN
40.0000 mg | SUBCUTANEOUS | Status: DC
  Administered 2012-07-22 – 2012-07-24 (×3): 40 mg via SUBCUTANEOUS
  Filled 2012-07-21 (×4): qty 0.4

## 2012-07-21 MED ORDER — HEPARIN SODIUM (PORCINE) 5000 UNIT/ML IJ SOLN
5000.0000 [IU] | Freq: Three times a day (TID) | INTRAMUSCULAR | Status: DC
  Filled 2012-07-21 (×2): qty 1

## 2012-07-21 MED ORDER — ONDANSETRON HCL 4 MG/2ML IJ SOLN: 4.0000 mg | Freq: Four times a day (QID) | INTRAMUSCULAR | Status: DC | PRN

## 2012-07-21 MED ORDER — PERFLUTREN LIPID MICROSPHERE
INTRAVENOUS | Status: AC
  Filled 2012-07-21: qty 10

## 2012-07-21 MED ORDER — SODIUM CHLORIDE 0.9 % IJ SOLN: 3.0000 mL | Freq: Two times a day (BID) | INTRAMUSCULAR | Status: DC

## 2012-07-21 MED ORDER — LISINOPRIL 5 MG PO TABS
5.0000 mg | ORAL_TABLET | Freq: Two times a day (BID) | ORAL | Status: DC
  Administered 2012-07-21 – 2012-07-25 (×10): 5 mg via ORAL
  Filled 2012-07-21 (×13): qty 1

## 2012-07-21 MED ORDER — NITROGLYCERIN 2 % TD OINT
0.5000 [in_us] | TOPICAL_OINTMENT | Freq: Four times a day (QID) | TRANSDERMAL | Status: DC
  Administered 2012-07-21 – 2012-07-25 (×18): 0.5 [in_us] via TOPICAL
  Filled 2012-07-21 (×29): qty 30

## 2012-07-21 MED ORDER — BIOTENE DRY MOUTH MT LIQD
15.0000 mL | Freq: Two times a day (BID) | OROMUCOSAL | Status: DC
  Administered 2012-07-21 – 2012-07-25 (×9): 15 mL via OROMUCOSAL

## 2012-07-21 MED ORDER — ENOXAPARIN SODIUM 40 MG/0.4ML ~~LOC~~ SOLN: 40.0000 mg | SUBCUTANEOUS | Status: DC

## 2012-07-21 MED ORDER — FENTANYL CITRATE 0.05 MG/ML IJ SOLN
INTRAMUSCULAR | Status: AC
  Filled 2012-07-21: qty 2

## 2012-07-21 MED ORDER — NITROGLYCERIN 0.2 MG/ML ON CALL CATH LAB
INTRAVENOUS | Status: AC
  Filled 2012-07-21: qty 1

## 2012-07-21 MED ORDER — HEPARIN (PORCINE) IN NACL 100-0.45 UNIT/ML-% IJ SOLN
1300.0000 [IU]/h | INTRAMUSCULAR | Status: DC
  Administered 2012-07-21: 1300 [IU]/h via INTRAVENOUS
  Filled 2012-07-21 (×2): qty 250

## 2012-07-21 MED ORDER — LIDOCAINE HCL (PF) 1 % IJ SOLN
INTRAMUSCULAR | Status: AC
  Filled 2012-07-21: qty 30

## 2012-07-21 MED ORDER — INSULIN ASPART 100 UNIT/ML ~~LOC~~ SOLN
0.0000 [IU] | Freq: Every day | SUBCUTANEOUS | Status: DC
  Administered 2012-07-21: 2 [IU] via SUBCUTANEOUS

## 2012-07-21 MED ORDER — TRAMADOL HCL 50 MG PO TABS
50.0000 mg | ORAL_TABLET | Freq: Four times a day (QID) | ORAL | Status: DC | PRN
  Filled 2012-07-21 (×2): qty 1

## 2012-07-21 MED ORDER — GABAPENTIN 600 MG PO TABS
600.0000 mg | ORAL_TABLET | Freq: Three times a day (TID) | ORAL | Status: DC
  Administered 2012-07-21 – 2012-07-26 (×16): 600 mg via ORAL
  Filled 2012-07-21 (×19): qty 1

## 2012-07-21 MED ORDER — ASPIRIN 81 MG PO CHEW
324.0000 mg | CHEWABLE_TABLET | ORAL | Status: AC
  Administered 2012-07-21: 324 mg via ORAL

## 2012-07-21 MED ORDER — SODIUM CHLORIDE 0.9 % IJ SOLN
3.0000 mL | Freq: Two times a day (BID) | INTRAMUSCULAR | Status: DC
  Administered 2012-07-21 – 2012-07-26 (×9): 3 mL via INTRAVENOUS

## 2012-07-21 MED ORDER — INFLUENZA VIRUS VACC SPLIT PF IM SUSP
0.5000 mL | INTRAMUSCULAR | Status: AC
  Administered 2012-07-21: 0.5 mL via INTRAMUSCULAR
  Filled 2012-07-21: qty 0.5

## 2012-07-21 MED ORDER — ACETAMINOPHEN 325 MG PO TABS: 650.0000 mg | ORAL_TABLET | ORAL | Status: DC | PRN

## 2012-07-21 MED ORDER — LISINOPRIL 5 MG PO TABS
5.0000 mg | ORAL_TABLET | Freq: Every day | ORAL | Status: DC
  Filled 2012-07-21: qty 1

## 2012-07-21 NOTE — H&P (Signed)
[facility]MT[facility] Attending Admission Note: Vickie Mcmurray MD [phone] pager office [phone] I  have seen and examined this patient, reviewed their chart. I have discussed this patient with the resident. I agree with the resident's findings, assessment and care plan.  SOB with B pleural effusions which are either subacute or acute. She does recall that her SOB has been increasing over last several weeks (in retrospect). I think ECHO will likely give [place] the information we need.

## 2012-07-21 NOTE — ED Notes (Signed)
Pt ambulatory to bathroom

## 2012-07-21 NOTE — Progress Notes (Signed)
Pt's most recent troponin level slightly elevated to 0.79 since previous level at 0.64. MD notified and stated he was aware. Will monitor. C.Necha Harries, RN.

## 2012-07-21 NOTE — Progress Notes (Signed)
Nursing Admission Note  Pt arrived to [id number] via stretcher from the ED. Spouse at bedside. A&OX4. No distress noted, vitals stable. Pt came to the hospital with increasing SOB, but none is noted at present. Telemetry in place. Mobility screen completed on patient. Oriented to unit and surroundings. Call bell within reach. Will continue to monitor. C.Kendrik Mcshan, RN.

## 2012-07-21 NOTE — ED Notes (Signed)
CT called and stated 20 g IV had blown above the site and they were returning pt for further access.

## 2012-07-21 NOTE — Consult Note (Addendum)
Advanced Heart Failure Team Consult Note  Referring Physician: Dr. Awanda Mink with Family Medicine Primary Physician: Dr. Tamala Julian Primary Cardiologist: new  Reason for Consultation: New onset heart failure  HPI:    [name] is a 56 y.o. female with past medical history of HL, morbid obesity, chronic pain and DM since she was 56 years old.  She had neuropathy in 2011 causing her to be in a wheelchair, this recovered after 8 months of therapy.    She was in her usual state of health until a couple of weeks ago where she has noted progressive dyspnea to the point she could not walk from the car to the store.  +abdominal distention and lower extremity edema.  She also noted orthopnea and PND within the last week.  She denies any fever/chills.  Symptoms were concerning yesterday so she presented to the ER.   She was admitted to the telemetry unit.  Pertinent labs include proBNP 5000, troponin 0.64, 0.79 and 0.48, Cr 1.03.  CXR cardiomegaly with diffuse airspace disease vs edema and effusions.  ECG with LBBB (no old) D-dimer 1.77 with CTA showing no evidence of significant pulmonary embolus.  Mod size pleural effusions with bilateral basilar atelectasis or infiltration. She has been placed on IV lasix with improvement in symptoms but no I/Os to review.    Review of Systems: [y] = yes, [ ]  = no   General: Weight gain [ ] ; Weight loss [ ] ; Anorexia [ ] ; Fatigue [ ] ; Fever [ ] ; Chills [ ] ; Weakness [ ]   Cardiac: Chest pain/pressure [ ] ; Resting SOB [ ] ; Exertional SOB [ ] ; Orthopnea [ ] ; Pedal Edema [ ] ; Palpitations [ ] ; Syncope [ ] ; Presyncope [ ] ; Paroxysmal nocturnal dyspnea[ ]   Pulmonary: Cough [ ] ; Wheezing[ ] ; Hemoptysis[ ] ; Sputum [ ] ; Snoring [ ]   GI: Vomiting[ ] ; Dysphagia[ ] ; Melena[ ] ; Hematochezia [ ] ; Heartburn[ ] ; Abdominal pain [ ] ; Constipation [ ] ; Diarrhea [ ] ; BRBPR [ ]   GU: Hematuria[ ] ; Dysuria [ ] ; Nocturia[ ]   Vascular: Pain in legs with walking [ ] ; Pain in feet with lying flat  [ ] ; Non-healing sores [ ] ; Stroke [ ] ; TIA [ ] ; Slurred speech [ ] ;  Neuro: Headaches[ ] ; Vertigo[ ] ; Seizures[ ] ; Paresthesias[ ] ;Blurred vision [ ] ; Diplopia [ ] ; Vision changes [ ]   Ortho/Skin: Arthritis [ ] ; Joint pain [ ] ; Muscle pain [ ] ; Joint swelling [ ] ; Back Pain [ ] ; Rash [ ]   Psych: Depression[ ] ; Anxiety[ ]   Heme: Bleeding problems [ ] ; Clotting disorders [ ] ; Anemia [ ]   Endocrine: Diabetes [ ] ; Thyroid dysfunction[ ]   Home Medications Prior to Admission medications   Medication Sig Start Date End Date Taking? Authorizing Provider  aspirin EC 81 MG tablet Take 81 mg by mouth daily.   Yes Historical Provider, MD  cholecalciferol (VITAMIN D) 1000 UNITS tablet Take 1,000 Units by mouth daily.   Yes Historical Provider, MD  cyclobenzaprine (FLEXERIL) 10 MG tablet Take 10 mg by mouth at bedtime as needed. For pain/sleep 06/05/12  Yes Wardell Honour, MD  gabapentin (NEURONTIN) 600 MG tablet Take 1 tablet (600 mg total) by mouth 3 (three) times daily. 06/05/12  Yes Wardell Honour, MD  glimepiride (AMARYL) 4 MG tablet Take 1 tablet (4 mg total) by mouth 2 (two) times daily before a meal. 06/05/12  Yes Wardell Honour, MD  glucose blood test strip 1 each by Other route as needed. Use as instructed  Yes Historical Provider, MD  Lancets MISC by Does not apply route.   Yes Historical Provider, MD  metFORMIN (GLUCOPHAGE) 1000 MG tablet Take 1 tablet (1,000 mg total) by mouth 2 (two) times daily with a meal. 06/05/12  Yes Wardell Honour, MD  nortriptyline (PAMELOR) 50 MG capsule Take 1 capsule (50 mg total) by mouth at bedtime. 06/05/12  Yes Wardell Honour, MD  pravastatin (PRAVACHOL) 20 MG tablet Take 1 tablet (20 mg total) by mouth at bedtime. 06/05/12  Yes Wardell Honour, MD  traMADol (ULTRAM) 50 MG tablet Take 50 mg by mouth every 6 (six) hours as needed. For pain 06/05/12  Yes Wardell Honour, MD    Past Medical History: Past Medical History  Diagnosis Date  . Pure hypercholesterolemia   .  Obesity, unspecified   . Unspecified vitamin D deficiency   . Pure hypercholesterolemia   . Type II or unspecified type diabetes mellitus without mention of complication, not stated as uncontrolled   . Elevated blood pressure reading without diagnosis of hypertension   . Neuropathy 2011  . Chicken pox     Past Surgical History: Past Surgical History  Procedure Date  . Lumbar back surgery 1996    L4-5  . Groin abscess 2007    I & D with ICU stay due to sepsis.  . Gallbladder surgery 2011  . Total abdominal hysterectomy 01/2011  . Bilateral oophorectomy 01/2011    ovarian cyst benign    Family History: Family History  Problem Relation Age of Onset  . Diabetes Mother   . Cancer Father     prostate,colon,skin,lymph node  . Obesity Brother     Social History: History   Social History  . Marital Status: Married    Spouse Name: N/A    Number of Children: 2  . Years of Education: 12   Occupational History  . disabled     03/2010 for peripheral neuropathy  . home daycare     x 20 yrs.   Social History Main Topics  . Smoking status: Never Smoker   . Smokeless tobacco: None  . Alcohol Use: No  . Drug Use: No  . Sexually Active: None   Other Topics Concern  . None   Social History Narrative   Married x 38 yrs. Happily married, no abuse. Has 2 children: daughter 75, son 29. One grandson and 2 step grandchildren. Daughter and grandson live in home with pt and spouse. Pets: dog. Exercise: Inactive.Always uses seat belts, smoke detectors in home.No guns in the home. Caffeine use: 2 cups coffee per day. Nutrition: Well balanced diet.    Allergies:  No Known Allergies  Objective:    Vital Signs:   Temp:  [97.3 F (36.3 C)-98.7 F (37.1 C)] 97.3 F (36.3 C) (10/15 0430) Pulse Rate:  [90-116] 94  (10/15 0430) Resp:  [17-27] 18  (10/15 0430) BP: (108-147)/(63-88) 119/73 mmHg (10/15 0430) SpO2:  [89 %-100 %] 100 % (10/15 0430) Weight:  [126.4 kg (278 lb 10.6 oz)]  126.4 kg (278 lb 10.6 oz) (10/15 0430)    Weight change: Filed Weights   07/21/12 0430  Weight: 126.4 kg (278 lb 10.6 oz)    Intake/Output:   Intake/Output Summary (Last 24 hours) at 07/21/12 0931 Last data filed at 07/21/12 0659  Gross per 24 hour  Intake  14.52 ml  Output      0 ml  Net  14.52 ml     Physical Exam: General:  Obese, Well appearing. No resp difficulty HEENT: normal Neck: supple. JVP difficult to assess - appears 8cm. Carotids 2+ bilat; no bruits. No lymphadenopathy or thryomegaly appreciated. Cor: PMI nondisplaced. Regular rate & rhythm. Split S2.  No S3.   Lungs: clear except for decreased bilateral bases Abdomen: obese, soft, nontender, nondistended. No hepatosplenomegaly. No bruits or masses. Good bowel sounds. Extremities: no cyanosis, clubbing, rash, trace edema Neuro: alert & orientedx3, cranial nerves grossly intact. moves all 4 extremities w/o difficulty. Affect pleasant  Telemetry: NSR 90-100  Labs: Basic Metabolic Panel:  Lab [id number] 07/20/12 2029  NA -- 138  K -- 4.4  CL -- 103  CO2 -- 22  GLUCOSE -- 239*  BUN -- 18  CREATININE -- 1.03  CALCIUM -- 9.2  MG 1.5 --  PHOS -- --    Liver Function Tests: No results found for this basename: AST:5,ALT:5,ALKPHOS:5,BILITOT:5,PROT:5,ALBUMIN:5 in the last 168 hours No results found for this basename: LIPASE:5,AMYLASE:5 in the last 168 hours No results found for this basename: AMMONIA:3 in the last 168 hours  CBC:  Lab 07/20/12 2029 07/20/12 1859  WBC 9.2 8.8  NEUTROABS -- --  HGB 12.8 11.7*  HCT 39.1 38.1  MCV 85.2 88.7  PLT 250 --    Cardiac Enzymes:  Lab 07/21/12 0344 07/21/12 0116  CKTOTAL 90 --  CKMB 5.0* --  CKMBINDEX -- --  TROPONINI 0.79* 0.64*    BNP: BNP (last 3 results)  Basename 07/20/12 2028  PROBNP 5006.0*    CBG:  Lab 07/21/12 0817 07/21/12 0501  GLUCAP 189* 166*    Coagulation Studies: No results found for this basename: LABPROT:5,INR:5 in the  last 72 hours  Other results: EKG: NSR 95 LBBB (no old EKG to compare)  Imaging: Dg Chest 2 View  07/21/2012  *RADIOLOGY REPORT*  Clinical Data: Cough and shortness of breath.  CHEST - 2 VIEW  Comparison: None.  Findings: Shallow inspiration.  Borderline heart size with normal pulmonary vascularity.  Bilateral pleural effusions with basilar atelectasis or infiltration in the bases of the lungs.  No pneumothorax.  Mediastinal contours appear intact.  Degenerative changes in the spine.  IMPRESSION: Bilateral pleural effusions with basilar atelectasis or infiltration.   Original Report Authenticated By: Neale Burly, M.D.    Dg Chest 2 View  07/20/2012  *RADIOLOGY REPORT*  Clinical Data: Shortness of breath, diabetes, hypertension  CHEST - 2 VIEW  Comparison: 05/17/2010  Findings: Moderate pleural effusions with mid and lower lung diffuse airspace disease versus edema.  Heart is enlarged.  CHF is favored over pneumonia.  No pneumothorax.  Trachea is midline.  IMPRESSION: Cardiomegaly with diffuse airspace disease versus edema and effusions.   Original Report Authenticated By: Jerilynn Mages. Daryll Brod, M.D.    Ct Angio Chest W/cm &/or Wo Cm  07/21/2012  *RADIOLOGY REPORT*  Clinical Data: Shortness of breath for 1 week.  Lower extremity edema.  CT ANGIOGRAPHY CHEST  Technique:  Multidetector CT imaging of the chest using the standard protocol during bolus administration of intravenous contrast. Multiplanar reconstructed images including MIPs were obtained and reviewed to evaluate the vascular anatomy.  Contrast: 170mL OMNIPAQUE IOHEXOL 350 MG/ML SOLN  Comparison: None.  Findings: Technically adequate study with moderately good opacification of the central and segmental pulmonary arteries.  No focal filling defects are demonstrated.  No evidence of significant pulmonary embolus.  Normal heart size.  Normal caliber thoracic aorta.  No significant lymphadenopathy in the chest.  Moderate sized bilateral pleural  effusions with basilar  atelectasis or infiltration bilaterally.  Respiratory motion artifact limits visualization of the lung fields.  No pneumothorax.  Airways appear patent.  Degenerative changes in the thoracic spine.  Visualized portions of the upper abdominal organs are grossly unremarkable.  IMPRESSION: No evidence of significant pulmonary embolus.  Moderate sized bilateral pleural effusions with bilateral basilar atelectasis or infiltration.   Original Report Authenticated By: Neale Burly, M.D.       Medications:     Current Medications:    . antiseptic oral rinse  15 mL Mouth Rinse BID  . aspirin  324 mg Oral Once  . aspirin EC  81 mg Oral Daily  . digoxin  0.125 mg Oral Daily  . furosemide  40 mg Intravenous Once  . furosemide  40 mg Intravenous BID  . gabapentin  600 mg Oral TID  . heparin  4,000 Units Intravenous Once  . influenza  inactive virus vaccine  0.5 mL Intramuscular Tomorrow-1000  . lisinopril  5 mg Oral BID  . nitroGLYCERIN  0.5 inch Topical Q6H  . nortriptyline  50 mg Oral QHS  . simvastatin  20 mg Oral q1800  . sodium chloride  3 mL Intravenous Q12H  . DISCONTD: enoxaparin  40 mg Subcutaneous Q24H  . DISCONTD: lisinopril  5 mg Oral Daily     Infusions:    . heparin 1,300 Units/hr (07/21/12 0552)      Assessment:   1. Acute (porobably systolic) heart failure     - echo pending 2. NSTEMI most likely secondary to #1    - troponin trending down 3. DM 4. Obesity 5. HL  Plan/Discussion:    Mrs. Uhle presents with congestive heart failure.  At this time unsure if it is systolic vs diastolic but will have echo this morning to determine.  Agree with IV lasix for the next 24 hours and will reassess in the morning as volume status and symptoms are much improved.  If systolic heart failure will proceed with right and left heart catheterization this admission to rule out ischemic etiology in setting of left bundle branch block, DM and mild  troponin bump.  Although, I suspect mild troponin bump is related to heart failure/cardiac strain but will continue ASA and statin.          Will continue further discussion about her heart failure diagnosis while in house.    Length of Stay: [place] 07/21/2012, 9:31 AM  Patient seen and examined with Leone Haven, PA-C. We discussed all aspects of the encounter. I agree with the assessment and plan as stated above.   Mrs. Donathan presents with two weeks of HF symptoms. Suspect she has new-onset systolic HF. Much improved with diuresis. Her troponins are mildly positive but this is likely due to HF. However, she has a long h/o DM2 and is at high risk for underlying CAD.  She will need an echo and R and L heart cath today. Agree with asa, ACE-I and lasix. Would add digoxin. Can likely start b-blocker soon.  I discussed systolic vs diastolic HF with her and her husband as well as the work-up and treatment. They are willing to proceed.   Koryn Charlot,MD 12:28 PM   Addendum: Echo read personally. EF 20-25%.  Aniceto Kyser,MD 12:28 PM

## 2012-07-21 NOTE — Progress Notes (Signed)
ANTICOAGULATION CONSULT NOTE - Initial Consult  Pharmacy Consult for heparin Indication: chest pain/ACS  No Known Allergies  Patient Measurements: Heparin Dosing Weight: 97kg  Vital Signs: Temp: 98.2 F (36.8 C) (10/14 2013) Temp src: Oral (10/14 2013) BP: 111/63 mmHg (10/15 0315) Pulse Rate: 90  (10/15 0315)  Labs:  Basename 07/21/12 0116 07/20/12 2029 07/20/12 1859  HGB -- 12.8 11.7*  HCT -- 39.1 38.1  PLT -- 250 --  APTT -- -- --  LABPROT -- -- --  INR -- -- --  HEPARINUNFRC -- -- --  CREATININE -- 1.03 --  CKTOTAL -- -- --  CKMB -- -- --  TROPONINI 0.64* -- --    Medical History: Past Medical History  Diagnosis Date  . Pure hypercholesterolemia   . Obesity, unspecified   . Unspecified vitamin D deficiency   . Pure hypercholesterolemia   . Type II or unspecified type diabetes mellitus without mention of complication, not stated as uncontrolled   . Elevated blood pressure reading without diagnosis of hypertension   . Neuropathy 2011  . Chicken pox     Medications:  Prescriptions prior to admission  Medication Sig Dispense Refill  . aspirin EC 81 MG tablet Take 81 mg by mouth daily.      . cholecalciferol (VITAMIN D) 1000 UNITS tablet Take 1,000 Units by mouth daily.      . cyclobenzaprine (FLEXERIL) 10 MG tablet Take 10 mg by mouth at bedtime as needed. For pain/sleep      . gabapentin (NEURONTIN) 600 MG tablet Take 1 tablet (600 mg total) by mouth 3 (three) times daily.  90 tablet  5  . glimepiride (AMARYL) 4 MG tablet Take 1 tablet (4 mg total) by mouth 2 (two) times daily before a meal.  60 tablet  11  . glucose blood test strip 1 each by Other route as needed. Use as instructed      . Lancets MISC by Does not apply route.      . metFORMIN (GLUCOPHAGE) 1000 MG tablet Take 1 tablet (1,000 mg total) by mouth 2 (two) times daily with a meal.  60 tablet  11  . nortriptyline (PAMELOR) 50 MG capsule Take 1 capsule (50 mg total) by mouth at bedtime.  30 capsule   5  . pravastatin (PRAVACHOL) 20 MG tablet Take 1 tablet (20 mg total) by mouth at bedtime.  30 tablet  11  . traMADol (ULTRAM) 50 MG tablet Take 50 mg by mouth every 6 (six) hours as needed. For pain       Scheduled:    . aspirin  324 mg Oral Once  . aspirin EC  81 mg Oral Daily  . furosemide  40 mg Intravenous Once  . gabapentin  600 mg Oral TID  . heparin  4,000 Units Intravenous Once  . lisinopril  5 mg Oral Daily  . nitroGLYCERIN  0.5 inch Topical Q6H  . nortriptyline  50 mg Oral QHS  . simvastatin  20 mg Oral q1800  . sodium chloride  3 mL Intravenous Q12H  . DISCONTD: enoxaparin  40 mg Subcutaneous Q24H    Assessment: 56yo female c/o worsening exertional dyspnea, no active CP but with EKG changes and mildly elevated troponin, to begin heparin for r/o.  Goal of Therapy:  Heparin level 0.3-0.7 units/ml Monitor platelets by anticoagulation protocol: Yes   Plan:  Will give heparin 4000 units IV bolus x1 followed by gtt at 1300 units/hr and monitor heparin levels and CBC.  Rogue Bussing PharmD BCPS 07/21/2012,4:25 AM

## 2012-07-21 NOTE — Progress Notes (Signed)
Echocardiogram 2D Echocardiogram with Definity has been performed.  [name] 07/21/2012, 11:02 AM

## 2012-07-21 NOTE — Progress Notes (Signed)
Pt is off unit

## 2012-07-21 NOTE — Progress Notes (Signed)
ANTICOAGULATION CONSULT NOTE - Follow Up Consult  Pharmacy Consult for Heparin Indication: chest pain/ACS  No Known Allergies  Patient Measurements: Heparin Dosing Weight: 97kg  Vital Signs: Temp: 97.4 F (36.3 C) (10/15 1148) Temp src: Oral (10/15 0430) BP: 135/79 mmHg (10/15 1148) Pulse Rate: 113  (10/15 1148)  Labs:  Basename 07/21/12 1153 07/21/12 0846 07/21/12 0344 07/21/12 0116 07/20/12 2029 07/20/12 1859  HGB 13.2 -- -- -- 12.8 --  HCT 39.1 -- -- -- 39.1 38.1  PLT 250 -- -- -- 250 --  APTT -- -- -- -- -- --  LABPROT -- -- -- -- -- --  INR -- -- -- -- -- --  HEPARINUNFRC <0.10* -- -- -- -- --  CREATININE -- -- -- -- 1.03 --  CKTOTAL -- -- 90 -- -- --  CKMB -- -- 5.0* -- -- --  TROPONINI -- 0.48* 0.79* 0.64* -- --    Estimated Creatinine Clearance: 83.6 ml/min (by C-G formula based on Cr of 1.03).   Medications:  Heparin @ 1300 units/hr  Assessment: 56yof on heparin for SOB with positive troponins. PE ruled out. Initial heparin level is undetectable but patient is going to cath very soon and heparin will be turned off so will not adjust rate at this time (OK per Dr. Haroldine Laws).  Goal of Therapy:  Heparin level 0.3-0.7 units/ml Monitor platelets by anticoagulation protocol: Yes   Plan: 1) Follow up after cath   Deboraha Sprang 07/21/2012,12:42 PM

## 2012-07-21 NOTE — CV Procedure (Signed)
Cardiac Cath Procedure Note  Indication: New onset HF  Procedures performed:  1) Right heart cathererization 2) Selective coronary angiography 3) Left heart catheterization 4) Left ventriculogram  Description of procedure:     The risks and indication of the procedure were explained. Consent was signed and placed on the chart. An appropriate timeout was taken prior to the procedure. The right groin was prepped and draped in the routine sterile fashion and anesthetized with 1% local lidocaine.   A 5 FR arterial sheath was placed in the right femoral artery using a modified Seldinger technique. Standard catheters including a JL4, JR4 and angled pigtail were used. All catheter exchanges were made over a wire. A 7 FR venous sheath was placed in the right femoral vein using a modified Seldinger technique. A standard Swan-Ganz catheter was used for the procedure.   Complications:  None apparent  Findings:  RA = 7 RV = 39/5/8 PA = 43/20 (31) PCW = 17 Fick cardiac output/index = 5.4/2.3 PVR = 2.5 Woods FA sat = 90% PA sat = 58%, 57%  Ao Pressure: 118/77 (95) LV Pressure: 116/21//2 There was no signficant gradient across the aortic valve on pullback.  Left main: Long. Mild plaque  LAD: Completely occluded proximally after first diagonal. 1st diagonal 30-40% ostial. Diffuse diabetic vasculopathy long 95-99% in midsection  LCX:  Small vessel (1.76mm) with severe diffuse diabetic vasculopathy. Ostial 80%   RAMUS: Moderate sized vessel. Diffuse 30%  RCA: Very large dominant vessel. Diffuse 40% in midsection.. 30% around distal bend. PDA moderate sized vessel subtotally occluded. 2 large PLs with mild plaque. Very fain collateral to LAD.  LV-gram done in the RAO projection: Ejection fraction = 15% Distal 2/3 of ventricle is akinetic  Assessment:  1. 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 Severe LV dysfunction 3. Mildly elevated filling pressures with preserved cardiac  output  Plan/Discussion:  She has had a silent out of hospital MI likely several weeks ago with total occlusion of her LAD in the setting of severe diabetic vasculopathy. Given timing of infarct and lack of post-infarct angina likely no benefit to PCI. Will plan MRI to evaluate for any viability. Will need aggressive HF management and possible LifeVest. Titrate ACE, add spiro. Start low-dose b-blocker when respiratory status more stable.    Glori Bickers, MD 2:15 PM

## 2012-07-21 NOTE — Progress Notes (Signed)
Progress Update PGY 1- Talked with pt this morning in regards to how she is feeling.  Anxious about her further studies.   Exam: Filed Vitals:   August 17, 2012 1329  BP:   Pulse: 110  Temp:   Resp:    NAD RRR, No murmurs, + S3 CTA B/L, no wheezes, +rales Bibasilar  A/P: [name] is a 56 y.o. year old female with history of DM type 2, HLD presenting with 2 weeks worsening exertional dyspnea found to have new onset acute congestive heart failure.  1) Acute Onset CHF Stage IV with EF of 15% - Went for cath today.  Showed complete occlusion of the LAD with most likely recent MI in the past couple of weeks.  Also has EF of 15%.  Will be managed medically by HF team.  Continue to follow  2) DM - SSI w/ CBG checks  3) Hyperlipidemia - LDL of 58 but complete occlusion of LAD.  Will continue on statin  FEN/GI: heart healthy diet. zofran prn. Saline lock.  Prophylaxis: Heparin SQ Disposition: Tx being managed by HF team.  Will follow directions   [place] R. Awanda Mink, DO of [facility] 17-Aug-2012, 3:10 PM

## 2012-07-21 NOTE — ED Notes (Signed)
IV team paged for access 

## 2012-07-21 NOTE — ED Provider Notes (Signed)
History     CSN: [id number]  Arrival date & time 07/20/12  2008   First MD Initiated Contact with Patient 07/20/12 2026      Chief Complaint  Patient presents with  . Shortness of Breath    sent from UC for further eval of sob and upper abd pain x1 day - dx'd with bilat pleural effusions and new LBBB per EMS      (Consider location/radiation/quality/duration/timing/severity/associated sxs/prior treatment) HPIBetty S [name] is a 56 y.o. female presenting to urgent care and then to the emergency department with 2 weeks history of shortness of breath, upper abdominal pain and was found to have bilateral pleural effusions and a new left bundle branch block per urgent care. Patient arrives to the ER, stable but tachycardic, not tachypnea with a decrease in her SpO2 of 89%. Patient is placed on 3L liters nasal cannula.  Patient describes worsening two-week history of dyspnea, worse on exertion, two-pillow orthopnea and for the last 2 days has been sleeping in a recliner. She's had no chest pain but describes a bandlike pain in her abdomen to epigastric region. Not associated with any nausea vomiting, diarrhea, dizziness, lightheadedness, changes in vision, weakness, numbness, dysarthria or difficulty walking.  Patient has a history of diabetes, obesity, no history known of coronary artery disease .  no prior MI  Past Medical History  Diagnosis Date  . Pure hypercholesterolemia   . Obesity, unspecified   . Unspecified vitamin D deficiency   . Pure hypercholesterolemia   . Type II or unspecified type diabetes mellitus without mention of complication, not stated as uncontrolled   . Elevated blood pressure reading without diagnosis of hypertension   . Neuropathy 2011  . Chicken pox     Past Surgical History  Procedure Date  . Lumbar back surgery 1996    L4-5  . Groin abscess 2007    I & D with ICU stay due to sepsis.  . Gallbladder surgery 2011  . Total abdominal hysterectomy  01/2011  . Bilateral oophorectomy 01/2011    ovarian cyst benign    Family History  Problem Relation Age of Onset  . Diabetes Mother   . Cancer Father     prostate,colon,skin,lymph node  . Obesity Brother     History  Substance Use Topics  . Smoking status: Never Smoker   . Smokeless tobacco: Not on file  . Alcohol Use: No    OB History    Grav Para Term Preterm Abortions TAB SAB Ect Mult Living                  Review of Systems At least 10pt or greater review of systems completed and are negative except where specified in the HPI.  Allergies  Review of patient's allergies indicates no known allergies.  Home Medications   Current Outpatient Rx  Name Route Sig Dispense Refill  . ASPIRIN EC 81 MG PO TBEC Oral Take 81 mg by mouth daily.    Marland Kitchen VITAMIN D 1000 UNITS PO TABS Oral Take 1,000 Units by mouth daily.    . CYCLOBENZAPRINE HCL 10 MG PO TABS Oral Take 10 mg by mouth at bedtime as needed. For pain/sleep    . GABAPENTIN 600 MG PO TABS Oral Take 1 tablet (600 mg total) by mouth 3 (three) times daily. 90 tablet 5  . GLIMEPIRIDE 4 MG PO TABS Oral Take 1 tablet (4 mg total) by mouth 2 (two) times daily before a meal. 60 tablet 11  .  GLUCOSE BLOOD VI STRP Other 1 each by Other route as needed. Use as instructed    . LANCETS MISC Does not apply by Does not apply route.    Marland Kitchen METFORMIN HCL 1000 MG PO TABS Oral Take 1 tablet (1,000 mg total) by mouth 2 (two) times daily with a meal. 60 tablet 11  . NORTRIPTYLINE HCL 50 MG PO CAPS Oral Take 1 capsule (50 mg total) by mouth at bedtime. 30 capsule 5  . PRAVASTATIN SODIUM 20 MG PO TABS Oral Take 1 tablet (20 mg total) by mouth at bedtime. 30 tablet 11  . TRAMADOL HCL 50 MG PO TABS Oral Take 50 mg by mouth every 6 (six) hours as needed. For pain      BP 118/63  Pulse 96  Temp 98.2 F (36.8 C) (Oral)  Resp 24  SpO2 96%  Physical Exam  Nursing notes reviewed.  Electronic medical record reviewed. VITAL SIGNS:   Filed  Vitals:   07/20/12 2245 07/20/12 2300 07/20/12 2330 07/20/12 2345  BP: 113/67 113/63 120/67 118/63  Pulse: 103 100 100 96  Temp:      TempSrc:      Resp: 22 24 19 24   SpO2: 97% 97% 95% 96%   CONSTITUTIONAL: Awake, oriented, appears non-toxic HENT: Atraumatic, normocephalic, oral mucosa pink and moist, airway patent. Nares patent without drainage. External ears normal. EYES: Conjunctiva clear, EOMI, PERRLA NECK: Trachea midline, non-tender, supple CARDIOVASCULAR: Tachycardic heart rate, Normal rhythm, No murmurs, rubs, gallops PULMONARY/CHEST: Upper lung fields are clear, rales in the mid fields, diminished to very faint breath sounds in the bases. Symmetrical breath sounds. Non-tender. ABDOMINAL: Non-distended, obese, soft, non-tender - no rebound or guarding.  BS normal. NEUROLOGIC: Non-focal, moving all four extremities, no gross sensory or motor deficits. EXTREMITIES: No clubbing, cyanosis. 3+ non pitting edema to the lower extremity SKIN: Warm, Dry, No erythema, No rash  ED Course  Procedures (including critical care time)  Date: 07/21/2012  Rate: 110  Rhythm: Sinus rhythm  QRS Axis: normal  Intervals: normal  ST/T Wave abnormalities: normal  Conduction Disutrbances: Left bundle branch block  Narrative Interpretation: New left bundle branch block since EKG dated 02/07/1999  Labs Reviewed  PRO B NATRIURETIC PEPTIDE - Abnormal; Notable for the following:    Pro B Natriuretic peptide (BNP) 5006.0 (*)     All other components within normal limits  BASIC METABOLIC PANEL - Abnormal; Notable for the following:    Glucose, Bld 239 (*)     GFR calc non Af Amer 59 (*)     GFR calc Af Amer 68 (*)     All other components within normal limits  D-DIMER, QUANTITATIVE - Abnormal; Notable for the following:    D-Dimer, Quant 1.77 (*)     All other components within normal limits  POCT I-STAT TROPONIN I - Abnormal; Notable for the following:    Troponin i, poc 0.22 (*)     All other  components within normal limits  CBC  TROPONIN I   Dg Chest 2 View  07/21/2012  *RADIOLOGY REPORT*  Clinical Data: Cough and shortness of breath.  CHEST - 2 VIEW  Comparison: None.  Findings: Shallow inspiration.  Borderline heart size with normal pulmonary vascularity.  Bilateral pleural effusions with basilar atelectasis or infiltration in the bases of the lungs.  No pneumothorax.  Mediastinal contours appear intact.  Degenerative changes in the spine.  IMPRESSION: Bilateral pleural effusions with basilar atelectasis or infiltration.   Original Report Authenticated  By: Neale Burly, M.D.    Dg Chest 2 View  07/20/2012  *RADIOLOGY REPORT*  Clinical Data: Shortness of breath, diabetes, hypertension  CHEST - 2 VIEW  Comparison: 05/17/2010  Findings: Moderate pleural effusions with mid and lower lung diffuse airspace disease versus edema.  Heart is enlarged.  CHF is favored over pneumonia.  No pneumothorax.  Trachea is midline.  IMPRESSION: Cardiomegaly with diffuse airspace disease versus edema and effusions.   Original Report Authenticated By: Jerilynn Mages. Daryll Brod, M.D.    Ct Angio Chest W/cm &/or Wo Cm  07/21/2012  *RADIOLOGY REPORT*  Clinical Data: Shortness of breath for 1 week.  Lower extremity edema.  CT ANGIOGRAPHY CHEST  Technique:  Multidetector CT imaging of the chest using the standard protocol during bolus administration of intravenous contrast. Multiplanar reconstructed images including MIPs were obtained and reviewed to evaluate the vascular anatomy.  Contrast: 146mL OMNIPAQUE IOHEXOL 350 MG/ML SOLN  Comparison: None.  Findings: Technically adequate study with moderately good opacification of the central and segmental pulmonary arteries.  No focal filling defects are demonstrated.  No evidence of significant pulmonary embolus.  Normal heart size.  Normal caliber thoracic aorta.  No significant lymphadenopathy in the chest.  Moderate sized bilateral pleural effusions with basilar  atelectasis or infiltration bilaterally.  Respiratory motion artifact limits visualization of the lung fields.  No pneumothorax.  Airways appear patent.  Degenerative changes in the thoracic spine.  Visualized portions of the upper abdominal organs are grossly unremarkable.  IMPRESSION: No evidence of significant pulmonary embolus.  Moderate sized bilateral pleural effusions with bilateral basilar atelectasis or infiltration.   Original Report Authenticated By: Neale Burly, M.D.      1. Dyspnea   2. Orthopnea   3. Acute CHF   4. Pleural effusion, bilateral   5. Edema   6. LBBB (left bundle branch block)       MDM  SAIAH JEDLICKA is a 56 y.o. female presenting with worsening shortness of breath, edema over the last couple of weeks. Patient has long history of diabetes. Physical exam is consistent with an exacerbation/new-onset congestive heart failure. Patient has her full edema, bilateral pleural effusions as well as rales to mid fields and is mildly hypoxic on room air at rest. Patient is also tachycardic to 116 on intake.    Patient's EKG-new left bundle branch block this may not meet Sgarbossa criteria: No ST elevation >= 53mm in a lead with concordant QRS complex No ST depression >= 70mm in ilead V1, V2 or V3 No ST elevation >=7mm in a lead with discordant QRS complex  Patient Wells score for PE is 16.2 giving her a moderate risk-, I think a PE is almost is likely his congestive heart failure in this undiagnosed congestive heart failure patient.  Obtain a d-dimer for further risk stratification. Labs show an elevated glucose however it's only at 239 without acidosis. D-dimer is elevated-have discussed risks and benefits with proceeding with a CT angiogram of her chest, the patient understands and agrees assessment need to rule out pulmonary embolism.    patient's BNP is elevated at 5006, further supporting acute CHF.   Chest x-ray significant for cardiomegaly, bilateral pleural  effusions and diffuse pulmonary edema.  CT PE shows no clots in the lungs.  Patient troponin from the i-STAT is elevated at 0.22-I do not think this represents an acute myocardial infarction. I think this is likely secondary to her congestive heart failure - troponin leak.    Vickie  Alvah Lagrow, MD 07/21/12 [phone]

## 2012-07-21 NOTE — H&P (Signed)
[facility] Admission History and Physical Service Pager: [phone]  Patient name: [name] Medical record number: [id number] Date of birth: 26-Jul-1956 Age: 56 y.o. Gender: female  Primary Care Provider:  Dr. Billy Coast  Chief Complaint: exertional dyspnea x 2 weeks.   History of Present Illness:  [name] is a 56 y.o. year old female with history of DM type 2, HLD presenting with 2 weeks worsening exertional dyspnea. Notes increased shortness of breath with shorter distances including parking lot and today had trouble making it from car to house without sitting to rest. She endorses sleeping in a recliner for two nights in the past week due to dyspnea. Noted some lower ext swelling as well. Nonproductive cough. Denies any chest pain in past several weeks. Had one transient episode abdominal discomfort that passed quickly today, none in past 2 weeks. Has no prior history cardiac problems, No new medications, alcohol or tobacco use.   She was initially evaluated at [facility] and found to have bilateral pleural effusions, new mild hypoxia and sent to ER for further evaluation. Since arrival, she has also undergone a CTA neg for PE, c/w bilateral effusions, BNP at 5000, EKG showing LBBB and mild elevation in troponin to 0.6. Stable on 3 L O2 and given IV lasix infusion.  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    Past Medical History: Past Medical History  Diagnosis Date  . Pure hypercholesterolemia   . Obesity, unspecified   . Unspecified vitamin D deficiency   . Pure hypercholesterolemia   . Type II or unspecified type diabetes mellitus without mention of complication, not stated as uncontrolled   . Elevated blood pressure reading without diagnosis of hypertension   . Neuropathy 2011  . Chicken pox    Past Surgical  History: Past Surgical History  Procedure Date  . Lumbar back surgery 1996    L4-5  . Groin abscess 2007    I & D with ICU stay due to sepsis.  . Gallbladder surgery 2011  . Total abdominal hysterectomy 01/2011  . Bilateral oophorectomy 01/2011    ovarian cyst benign   Social History: History  Substance Use Topics  . Smoking status: Never Smoker   . Smokeless tobacco: Not on file  . Alcohol Use: No    Family History: Family History  Problem Relation Age of Onset  . Diabetes Mother   . Cancer Father     prostate,colon,skin,lymph node  . Obesity Brother    Allergies: No Known Allergies   Review Of Systems: Per HPI with the following additions: denies dysuria, emesis, nausea, diarrhea, rash, myalgias, diaphoresis, fever, chills, decrease urination, abdominal bloating, runny nose, headache, visual changes.  Otherwise 12 point review of systems was performed and was unremarkable.  Physical Exam: BP 118/63  Pulse 96  Temp 98.2 F (36.8 C) (Oral)  Resp 24  SpO2 96% Exam: General: NAD. HOB elevated 20 degrees.  HEENT: NCAT, no LAD, pharynx without exudates. EOMI, PERRLA Cardiovascular: regular rhythm, mild tachycardia, S3 gallop is present. No murmurs. Respiratory: bibasilar rales to mid-lung fields. No wheezing. No increased WOB or accessory muscle use. Abdomen: obese, nontender, nondistended. BS + Extremities: 1-2+ pitting edema to upper shin level.  Skin: No skin breakdown or rash. Neuro: a/ox3. Symmetric ext strength, no gross motor or sensory deficits.   Labs and Imaging: CBC BMET   Lab 07/20/12  2029  WBC 9.2  HGB 12.8  HCT 39.1  PLT 250    Lab 07/20/12 2029  NA 138  K 4.4  CL 103  CO2 22  BUN 18  CREATININE 1.03  GLUCOSE 239*  CALCIUM 9.2     Results for orders placed during the hospital encounter of 07/20/12 (from the past 24 hour(s))  PRO B NATRIURETIC PEPTIDE     Status: Abnormal   Collection Time   07/20/12  8:28 PM      Component Value  Range   Pro B Natriuretic peptide (BNP) 5006.0 (*) 0 - 125 pg/mL  CBC     Status: Normal   Collection Time   07/20/12  8:29 PM      Component Value Range   WBC 9.2  4.0 - 10.5 K/uL   RBC 4.59  3.87 - 5.11 MIL/uL   Hemoglobin 12.8  12.0 - 15.0 g/dL   HCT 39.1  36.0 - 46.0 %   MCV 85.2  78.0 - 100.0 fL   MCH 27.9  26.0 - 34.0 pg   MCHC 32.7  30.0 - 36.0 g/dL   RDW 13.8  11.5 - 15.5 %   Platelets 250  150 - 400 K/uL  BASIC METABOLIC PANEL     Status: Abnormal   Collection Time   07/20/12  8:29 PM      Component Value Range   Sodium 138  135 - 145 mEq/L   Potassium 4.4  3.5 - 5.1 mEq/L   Chloride 103  96 - 112 mEq/L   CO2 22  19 - 32 mEq/L   Glucose, Bld 239 (*) 70 - 99 mg/dL   BUN 18  6 - 23 mg/dL   Creatinine, Ser 1.03  0.50 - 1.10 mg/dL   Calcium 9.2  8.4 - 10.5 mg/dL   GFR calc non Af Amer 59 (*) >90 mL/min   GFR calc Af Amer 68 (*) >90 mL/min  D-DIMER, QUANTITATIVE     Status: Abnormal   Collection Time   07/20/12  8:43 PM      Component Value Range   D-Dimer, Quant 1.77 (*) 0.00 - 0.48 ug/mL-FEU  TROPONIN I     Status: Abnormal   Collection Time   07/21/12  1:16 AM      Component Value Range   Troponin I 0.64 (*) <0.30 ng/mL  POCT I-STAT TROPONIN I     Status: Abnormal   Collection Time   07/21/12  2:14 AM      Component Value Range   Troponin i, poc 0.22 (*) 0.00 - 0.08 ng/mL   Comment NOTIFIED PHYSICIAN     Comment 3            CTA chest: MPRESSION:  No evidence of significant pulmonary embolus. Moderate sized  bilateral pleural effusions with bilateral basilar atelectasis or  infiltration.  EKG: NSR rate 110, normal axis, LBBB    Assessment and Plan: [name] is a 56 y.o. year old female with history of DM type 2, HLD presenting with 2 weeks worsening exertional dyspnea found to have new onset acute congestive heart failure.  1. Acute and new onset systolic heart failure decompensation. No prior history of CHF. Uncertain etiology, possibly a  subacute silent ischemic process or less likely related to her history of peripheral muscle degenerative disorder. She is without active chest pain, and uncertain the EKG changes are new. Mild elevation in troponin is likely cardiac strain, but since this is  trending mildly upward currently will start acs protocol heparin infusion per pharmacy. Aspirin 325 and statin already given today.   -continue IV lasix bolus prn, strict ins/outs, daily weights. -wean O2 as tolerated at 3L currently. -check 2D echocardiogram -cycle troponin, ck -consult heart failure team. -start low dose lisinopril 5mg . Continue asa and statin. -no nitro given with low end BP 100/65 currently.  -hold on beta blocker for now.  2. Diabetes mellitus type 2. Last A1c 7.7. Hold oral agents metformin and sulfonylurea as inpatient -Sliding scale moderate without qhs coverage.  3. HLD. Continue statin per formulary. Check FLP in am.   4. 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 Continue neurontin and flexeril prn at home dose. Will check CK/CK-MB fraction.  FEN/GI: heart healthy diet. zofran prn. Saline lock. Prophylaxis: heparin IV infusion currently.  Disposition: telemetry floor overnight. Code Status: full. Patient and husband informed of treatment plan, all questions answered.   Luis Abed, MD [facility] Resident - PGY-3 07/21/2012 3:42 AM

## 2012-07-22 ENCOUNTER — Inpatient Hospital Stay (HOSPITAL_COMMUNITY): Payer: BC Managed Care – PPO

## 2012-07-22 ENCOUNTER — Other Ambulatory Visit: Payer: Self-pay | Admitting: *Deleted

## 2012-07-22 DIAGNOSIS — I2510 Atherosclerotic heart disease of native coronary artery without angina pectoris: Secondary | ICD-10-CM

## 2012-07-22 LAB — GLUCOSE, CAPILLARY
Glucose-Capillary: 172 mg/dL — ABNORMAL HIGH (ref 70–99)
Glucose-Capillary: 301 mg/dL — ABNORMAL HIGH (ref 70–99)
Glucose-Capillary: 174 mg/dL — ABNORMAL HIGH (ref 70–99)

## 2012-07-22 LAB — CBC
HCT: 33.9 % — ABNORMAL LOW (ref 36.0–46.0)
Hemoglobin: 11 g/dL — ABNORMAL LOW (ref 12.0–15.0)
MCV: 84.8 fL (ref 78.0–100.0)
WBC: 7.9 10*3/uL (ref 4.0–10.5)

## 2012-07-22 LAB — BASIC METABOLIC PANEL
BUN: 20 mg/dL (ref 6–23)
CO2: 25 mEq/L (ref 19–32)
Chloride: 102 mEq/L (ref 96–112)
GFR calc Af Amer: 60 mL/min — ABNORMAL LOW (ref >90)
Potassium: 3.6 mEq/L (ref 3.5–5.1)

## 2012-07-22 MED ORDER — INSULIN ASPART 100 UNIT/ML ~~LOC~~ SOLN
0.0000 [IU] | Freq: Three times a day (TID) | SUBCUTANEOUS | Status: DC
  Administered 2012-07-22: 7 [IU] via SUBCUTANEOUS
  Administered 2012-07-23: 3 [IU] via SUBCUTANEOUS
  Administered 2012-07-23: 7 [IU] via SUBCUTANEOUS
  Administered 2012-07-24: 3 [IU] via SUBCUTANEOUS
  Administered 2012-07-24: 11 [IU] via SUBCUTANEOUS
  Administered 2012-07-24 – 2012-07-25 (×2): 4 [IU] via SUBCUTANEOUS
  Administered 2012-07-25: 5 [IU] via SUBCUTANEOUS
  Administered 2012-07-25: 11 [IU] via SUBCUTANEOUS
  Administered 2012-07-26: 3 [IU] via SUBCUTANEOUS

## 2012-07-22 MED ORDER — SODIUM CHLORIDE 0.9 % IJ SOLN
3.0000 mL | Freq: Two times a day (BID) | INTRAMUSCULAR | Status: DC
  Administered 2012-07-22 – 2012-07-25 (×3): 3 mL via INTRAVENOUS

## 2012-07-22 MED ORDER — POTASSIUM CHLORIDE 20 MEQ PO PACK
20.0000 meq | PACK | Freq: Once | ORAL | Status: DC
  Filled 2012-07-22: qty 1

## 2012-07-22 MED ORDER — INSULIN ASPART 100 UNIT/ML ~~LOC~~ SOLN
0.0000 [IU] | Freq: Every day | SUBCUTANEOUS | Status: DC
  Administered 2012-07-23: 2 [IU] via SUBCUTANEOUS

## 2012-07-22 MED ORDER — SODIUM CHLORIDE 0.9 % IJ SOLN
3.0000 mL | INTRAMUSCULAR | Status: DC | PRN
  Administered 2012-07-24: 3 mL via INTRAVENOUS

## 2012-07-22 MED ORDER — POTASSIUM CHLORIDE CRYS ER 20 MEQ PO TBCR
20.0000 meq | EXTENDED_RELEASE_TABLET | Freq: Once | ORAL | Status: AC
  Administered 2012-07-22: 20 meq via ORAL
  Filled 2012-07-22: qty 1

## 2012-07-22 MED ORDER — DIAZEPAM 5 MG PO TABS
5.0000 mg | ORAL_TABLET | ORAL | Status: AC
  Administered 2012-07-23: 5 mg via ORAL
  Filled 2012-07-22: qty 1

## 2012-07-22 MED ORDER — FUROSEMIDE 40 MG PO TABS
40.0000 mg | ORAL_TABLET | Freq: Every day | ORAL | Status: DC
  Administered 2012-07-24: 40 mg via ORAL
  Filled 2012-07-22 (×2): qty 1

## 2012-07-22 MED ORDER — TICAGRELOR 90 MG PO TABS
90.0000 mg | ORAL_TABLET | Freq: Two times a day (BID) | ORAL | Status: DC
  Administered 2012-07-23 – 2012-07-26 (×7): 90 mg via ORAL
  Filled 2012-07-22 (×8): qty 1

## 2012-07-22 MED ORDER — TICAGRELOR 90 MG PO TABS
180.0000 mg | ORAL_TABLET | Freq: Once | ORAL | Status: AC
  Administered 2012-07-22: 180 mg via ORAL
  Filled 2012-07-22: qty 2

## 2012-07-22 MED ORDER — GADOBENATE DIMEGLUMINE 529 MG/ML IV SOLN
30.0000 mL | Freq: Once | INTRAVENOUS | Status: AC
  Administered 2012-07-22: 30 mL via INTRAVENOUS

## 2012-07-22 MED ORDER — SODIUM CHLORIDE 0.9 % IV SOLN
INTRAVENOUS | Status: DC
  Administered 2012-07-23: 07:00:00 via INTRAVENOUS

## 2012-07-22 MED ORDER — CARVEDILOL 3.125 MG PO TABS
3.1250 mg | ORAL_TABLET | Freq: Two times a day (BID) | ORAL | Status: DC
  Administered 2012-07-22 – 2012-07-26 (×9): 3.125 mg via ORAL
  Filled 2012-07-22 (×11): qty 1

## 2012-07-22 MED ORDER — SODIUM CHLORIDE 0.9 % IV SOLN: 250.0000 mL | INTRAVENOUS | Status: DC | PRN

## 2012-07-22 NOTE — Progress Notes (Signed)
Inpatient Diabetes Program Recommendations  AACE/ADA: New Consensus Statement on Inpatient Glycemic Control (2013)  Target Ranges:  Prepandial:   less than 140 mg/dL      Peak postprandial:   less than 180 mg/dL (1-2 hours)      Critically ill patients:  140 - 180 mg/dL   Results for GARRIE, DISOTELL (MRN [id number]) as of 07/22/2012 10:47  Ref. Range 07/21/2012 11:47 07/21/2012 14:43 07/21/2012 16:16 07/21/2012 21:38 07/22/2012 07:48  Glucose-Capillary Latest Range: 70-99 mg/dL 225 (H) 208 (H) 174 (H) 229 (H) 172 (H)    Inpatient Diabetes Program Recommendations Correction (SSI): May want to consider increasing Novolog correction to moderate scale. Insulin - Meal Coverage: If blood glucose continues to be >180, please consider adding meal coverage.  Note: Pt is on heart healthy diet, added comment in order to note that patient needs to be also be on Carbohydrate Modified.  Please consider changing correction Novolog to the moderate scale and if CBGs continue to be greater than 180, may want to consider adding meal coverage.  Will continue to follow. Thanks, Barnie Alderman, RN, BSN, [place] [facility] Coordinator Inpatient Diabetes Program [phone]

## 2012-07-22 NOTE — Progress Notes (Signed)
Discussed patient's case with Dr Aundra Dubin who interpreted cardiac MRI. The patient has viability of the anteroapex based on less than 50% delayed enhancement. I discussed her case with Dr Cyndia Bent of cardiac surgery who reviewed her films. We agree that PCI is the preferred revascularization strategy considering the LAD target beyond the total occlusion is poorly visualized and may not be a reasonable surgical target. The patient also has multiple significant comorbidities with an LVEF of 20%. I have reviewed the risks, indication, and alternatives to PCI of the LAD with the patient and her family. Specifically we discussed the lower success rate of CTO PCI and higher potential complication rate. Considering her viable myocardium and severe LV dysfunction, these increased risks are justified. She agrees to proceed. I will load her with Brilinta tonight.  Sherren Mocha 07/22/2012 6:53 PM

## 2012-07-22 NOTE — Progress Notes (Signed)
FMTS Attending Note Patient seen and examined by me, discussed with resident team and I agree with Dr Awanda Mink' note for today.  Patient reports she is having no dyspnea; did not ever had chest pain and thus remains surprised to learn that she had a heart attack. Is beginning on small doses of carvedilol, continued statins.  Her LDL is 58 on lipid panel this hospitalization.   For cardiac MRI and to follow cardiology recommendations regarding medication titration; possible consideration for CABG pending interpretation of MRI. Dalbert Mayotte MD

## 2012-07-22 NOTE — Progress Notes (Addendum)
Advanced Heart Failure Rounding Note   Subjective:    [name] is a 56 y.o. female with past medical history of HL, morbid obesity, chronic pain and DM since she was 56 years old. She had neuropathy in 2011 causing her to be in a wheelchair, this recovered after 8 months of therapy.   RA = 7  RV = 39/5/8  PA = 43/20 (31)  PCW = 17  Fick cardiac output/index = 5.4/2.3  PVR = 2.5 Woods  FA sat = 90%  PA sat = 58%, 57% Ao Pressure: 118/77 (95)  LV Pressure: 116/21//2 Ao Pressure: 118/77 (95)  LV Pressure: 116/21//2  Left main: Long. Mild plaque  LAD: Completely occluded proximally after first diagonal. 1st diagonal 30-40% ostial. Diffuse diabetic vasculopathy long 95-99% in midsection  LCX: Small vessel (1.6mm) with severe diffuse diabetic vasculopathy. Ostial 80%  RAMUS: Moderate sized vessel. Diffuse 30%  RCA: Very large dominant vessel. Diffuse 40% in midsection.. 30% around distal bend. PDA moderate sized vessel subtotally occluded. 2 large PLs with mild plaque. Very fain collateral to LAD.  LV-gram done in the RAO projection: Ejection fraction = 15% Distal 2/3 of ventricle is akinetic  S/p complex PTCA and stenting of LAD (chronic total occlusion) on 10/17 by Dr. Burt Knack.  Has not ambulated at all since admit.  No chest pain or dyspnea.  Received 50 cc/hr continuous infusion since cath, weight up 5 pounds.      Objective:   Weight Range:  Vital Signs:   Temp:  [97.8 F (36.6 C)-98.6 F (37 C)] 97.8 F (36.6 C) (10/18 0753) Pulse Rate:  [83-93] 89  (10/18 0753) Resp:  [12-25] 25  (10/18 0753) BP: (92-116)/(50-66) 116/66 mmHg (10/18 0753) SpO2:  [94 %-99 %] 94 % (10/18 0753) Weight:  [129 kg (284 lb 6.3 oz)] 129 kg (284 lb 6.3 oz) (10/18 0500) Last BM Date: 07/19/12  Weight change: Filed Weights   07/22/12 0500 07/23/12 0500 07/24/12 0500  Weight: 127.007 kg (280 lb) 127 kg (279 lb 15.8 oz) 129 kg (284 lb 6.3 oz)    Intake/Output:   Intake/Output Summary  (Last 24 hours) at 07/24/12 0919 Last data filed at 07/24/12 0900  Gross per 24 hour  Intake   1300 ml  Output   2150 ml  Net   -850 ml     Physical Exam: General: Obese, Well appearing. No resp difficulty  HEENT: normal  Neck: supple. JVP difficult to assess. Carotids 2+ bilat; no bruits. No lymphadenopathy or thryomegaly appreciated.  Cor: PMI nondisplaced. Regular rate & rhythm. Split S2. No S3.  Lungs: clear except for decreased bilateral bases  Abdomen: obese, soft, nontender, nondistended. No hepatosplenomegaly. No bruits or masses. Good bowel sounds.  Extremities: no cyanosis, clubbing, rash, trace edema  Neuro: alert & orientedx3, cranial nerves grossly intact. moves all 4 extremities w/o difficulty. Affect pleasant   Telemetry: NSR  Labs: Basic Metabolic Panel:  Lab [id number] 07/23/12 0440 07/22/12 0515 07/21/12 1750 07/21/12 0345 07/20/12 2029  NA 139 140 137 -- -- 138  K 4.1 3.7 3.6 -- -- 4.4  CL 106 104 102 -- -- 103  CO2 24 24 25  -- -- 22  GLUCOSE 192* 189* 181* -- -- 239*  BUN 21 23 20  -- -- 18  CREATININE 1.07 1.11* 1.14* 0.93 -- 1.03  CALCIUM 8.6 8.4 8.4 -- -- --  MG -- -- -- -- 1.5 --  PHOS -- -- -- -- -- --    Liver  Function Tests:  Lab 07/20/12 1854  AST 12  ALT 8  ALKPHOS 96  BILITOT 0.3  PROT 7.0  ALBUMIN 4.1   No results found for this basename: LIPASE:5,AMYLASE:5 in the last 168 hours No results found for this basename: AMMONIA:3 in the last 168 hours  CBC:  Lab 07/24/12 0445 07/23/12 0440 07/22/12 0515 07/21/12 1750 07/21/12 1153  WBC 7.7 8.4 7.9 7.1 9.0  NEUTROABS -- -- -- -- --  HGB 11.2* 11.3* 11.0* 11.7* 13.2  HCT 34.7* 33.9* 33.9* 36.1 39.1  MCV 85.5 84.5 84.8 84.1 84.8  PLT 195 213 227 209 250    Cardiac Enzymes:  Lab 07/21/12 0846 07/21/12 0344 07/21/12 0116  CKTOTAL -- 90 --  CKMB -- 5.0* --  CKMBINDEX -- -- --  TROPONINI 0.48* 0.79* 0.64*    BNP: BNP (last 3 results)  Basename 07/20/12 2028  PROBNP 5006.0*         Imaging: Mr Card Morphology Wo/w Cm  07/22/2012  *RADIOLOGY REPORT*  Clinical Data: CAD, evaluate for viability.  MR CARDIA MORPHOLOGY WITHOUT AND WITH CONTRAST  GE 1.5 T magnet with dedicated cardiac coil.  FIESTA sequences for function and morphology.  10 minutes after 30 mL Multihance contrast was injected, inversion recovery sequences were done to assess for myocardial delayed enhancement.  EF was calculated at a dedicated workstation.  Contrast: 33mL MULTIHANCE GADOBENATE DIMEGLUMINE 529 MG/ML IV SOLN  Comparison: None.  Findings: Technically difficult study due to artifact from inadequate breath-holding.  There were bilateral R>L moderate pleural effusions.  There was a trivial pericardial effusion.  The left ventricle was normal in size and wall thickness with severe systolic dysfunction, EF 123456. There was septal-lateral dyssynchrony.  The inferoseptal and anteroseptal walls were akinetic.  The apical lateral wall was severely hypokinetic.  The mid to apical inferior wall segments were hypokinetic.  The mid to apical anterior wall was moderate to severely hypokinetic.  No LV thrombus was noted.  The right ventricle was normal in size and systolic function.  The right atrium was normal in size.  There was mild left atrial enlargement. Images were suboptimal due to difficulty with breath holds, so I am unable to comment on valvular regurgitation.  On delayed enhancement imaging, there was 26-50% wall thickness subendocardial delayed enhancement in the mid to apical inferoseptal, anteroseptal, and anterior wall segments.  Measurements:  LVEDV 245 mL  LV SV 50 mL  LV EF 20%  IMPRESSION: 1. Normal LV size with severe systolic dysfunction, EF 123456.  There were wall motion abnormalities as described above.  2. RV normal in size and systolic function.  3. On delayed enhancement imaging, there was 26-50% wall thickness subendocardial enhancement in the mid to apical inferoseptal, anteroseptal, and  anterior walls.  This suggests that the LV segments with wall motion abnormalities may improve in function with revascularization (suggests viability).   Original Report Authenticated By: [id number]      Medications:     Scheduled Medications:    . antiseptic oral rinse  15 mL Mouth Rinse BID  . aspirin EC  81 mg Oral Daily  . bivalirudin      . bivalirudin (ANGIOMAX) infusion 5 mg/mL (Cath Lab,ACS,PCI indication)  0.25 mg/kg/hr Intravenous To Cath  . carvedilol  3.125 mg Oral BID WC  . diazepam  5 mg Oral On Call  . digoxin  0.125 mg Oral Daily  . enoxaparin  40 mg Subcutaneous Q24H  . fentaNYL      .  furosemide  40 mg Oral Daily  . gabapentin  600 mg Oral TID  . heparin      . insulin aspart  0-20 Units Subcutaneous TID WC  . insulin aspart  0-5 Units Subcutaneous QHS  . lidocaine      . lisinopril  5 mg Oral BID  . midazolam      . midazolam      . midazolam      . nitroGLYCERIN  0.5 inch Topical Q6H  . nitroGLYCERIN      . nortriptyline  50 mg Oral QHS  . simvastatin  20 mg Oral q1800  . sodium chloride  3 mL Intravenous Q12H  . sodium chloride  3 mL Intravenous Q12H  . spironolactone  12.5 mg Oral Daily  . Ticagrelor  90 mg Oral BID  . verapamil      . DISCONTD: bivalirudin (ANGIOMAX) infusion 0.5 mg/mL (Non-ACS indications)  0.1 mg/kg/hr Intravenous To Cath    Infusions:    . sodium chloride 50 mL/hr at 07/23/12 0630  . sodium chloride 50 mL/hr at 07/23/12 1530    PRN Medications: sodium chloride, sodium chloride, acetaminophen, cyclobenzaprine, nitroGLYCERIN, ondansetron (ZOFRAN) IV, oxyCODONE-acetaminophen, sodium chloride, sodium chloride, traMADol, DISCONTD: acetaminophen, DISCONTD: ondansetron (ZOFRAN) IV   Assessment:   1. Acute systolic heart failure  2. ICM, EF 20-25% 3. Out of hospital anterior MI 4. CAD s/p complex PTCA and stenting of LAD (chronic total occlusion) 5. DM  6. Obesity  7. HL 8. LBBB  Plan/Discussion:    Volume status mildly  elevated post cath due to continuous infusion.  Will increase lasix 40 mg BID today.  Follow renal function closely.  Will continue carvedilol, digoxin, lisinopril and spiro.  Increase spiro 25 mg daily.    Doing well post PCI of LAD.  Will continue ASA and brilinta for 1 year.  Per Dr. Burt Knack, may consider PCI of diag in ~4 weeks depending on clinical course.  Cardiac Rehab to begin today.    Possible discharge over the weekend with close follow up in the HF clinic.    Length of Stay: [place], [facility] 07/24/2012, 9:19 AM   Patient seen and examined with Vickie Haven, PA-C. We discussed all aspects of the encounter. I agree with the assessment and plan as stated above.   S/p PCI LAD yesterday. She is much improved. Still mildly dyspneic. Will give IV lasix today. PT to see. Continue current meds otherwise. Likely home tomorrow or Monday.  Daniel Bensimhon,MD 10:25 AM

## 2012-07-22 NOTE — Progress Notes (Addendum)
Patient ID: [name], female   DOB: April 24, 1956, 56 y.o.   MRN: [id number]    SUBJECTIVE: No complaints this morning.  Feels like breathing is back to normal.  Slept well.  L/RHC yesterday with the below results:   RHC RA = 7  RV = 39/5/8  PA = 43/20 (31)  PCW = 17  Fick cardiac output/index = 5.4/2.3  PVR = 2.5 Woods  FA sat = 90%  PA sat = 58%, 57%  LHC: Totally occluded LAD, diffuse disease in small LCX.      Marland Kitchen antiseptic oral rinse  15 mL Mouth Rinse BID  . aspirin      . aspirin  324 mg Oral Pre-Cath  . aspirin EC  81 mg Oral Daily  . digoxin  0.125 mg Oral Daily  . enoxaparin  40 mg Subcutaneous Q24H  . fentaNYL      . furosemide  40 mg Intravenous BID  . gabapentin  600 mg Oral TID  . heparin      . influenza  inactive virus vaccine  0.5 mL Intramuscular Tomorrow-1000  . insulin aspart  0-5 Units Subcutaneous QHS  . insulin aspart  0-9 Units Subcutaneous TID WC  . lidocaine      . lisinopril  5 mg Oral BID  . midazolam      . nitroGLYCERIN  0.5 inch Topical Q6H  . nitroGLYCERIN      . nortriptyline  50 mg Oral QHS  . simvastatin  20 mg Oral q1800  . sodium chloride  3 mL Intravenous Q12H  . spironolactone  12.5 mg Oral Daily  . DISCONTD: heparin subcutaneous  5,000 Units Subcutaneous Q8H  . DISCONTD: lisinopril  5 mg Oral Daily  . DISCONTD: sodium chloride  3 mL Intravenous Q12H      Filed Vitals:   07/22/12 0000 07/22/12 0400 07/22/12 0500 07/22/12 0745  BP: 94/59 101/49  97/57  Pulse:    89  Temp: 98.6 F (37 C) 97.5 F (36.4 C)  97.5 F (36.4 C)  TempSrc: Oral Oral  Oral  Resp: 19 16  16   Height:      Weight:   280 lb (127.007 kg)   SpO2: 92% 92%  93%    Intake/Output Summary (Last 24 hours) at 07/22/12 0822 Last data filed at 07/22/12 0747  Gross per 24 hour  Intake    672 ml  Output   2450 ml  Net  -1778 ml    LABS: Basic Metabolic Panel:  Basename 07/22/12 0515 07/21/12 1750 07/21/12 0345 07/20/12 2029  NA 137 -- -- 138  K  3.6 -- -- 4.4  CL 102 -- -- 103  CO2 25 -- -- 22  GLUCOSE 181* -- -- 239*  BUN 20 -- -- 18  CREATININE 1.14* 0.93 -- --  CALCIUM 8.4 -- -- 9.2  MG -- -- 1.5 --  PHOS -- -- -- --   Liver Function Tests:  [facility] 07/20/12 1854  AST 12  ALT 8  ALKPHOS 96  BILITOT 0.3  PROT 7.0  ALBUMIN 4.1   No results found for this basename: LIPASE:2,AMYLASE:2 in the last 72 hours CBC:  Basename 07/22/12 0515 07/21/12 1750  WBC 7.9 7.1  NEUTROABS -- --  HGB 11.0* 11.7*  HCT 33.9* 36.1  MCV 84.8 84.1  PLT 227 209   Cardiac Enzymes:  Basename 07/21/12 0846 07/21/12 0344 07/21/12 0116  CKTOTAL -- 90 --  CKMB -- 5.0* --  CKMBINDEX -- -- --  TROPONINI 0.48* 0.79* 0.64*   BNP: No components found with this basename: POCBNP:3 D-Dimer:  [facility] 07/20/12 2043  DDIMER 1.77*   Hemoglobin A1C:  Basename 07/21/12 0846  HGBA1C 7.3*   Fasting Lipid Panel:  Basename 07/21/12 0345  CHOL 121  HDL 34*  LDLCALC 58  TRIG 146  CHOLHDL 3.6  LDLDIRECT --   Thyroid Function Tests:  Basename 07/21/12 0846  TSH 1.717  T4TOTAL --  T3FREE --  THYROIDAB --   Anemia Panel: No results found for this basename: VITAMINB12,FOLATE,FERRITIN,TIBC,IRON,RETICCTPCT in the last 72 hours  RADIOLOGY: Dg Chest 2 View  07/21/2012  *RADIOLOGY REPORT*  Clinical Data: Cough and shortness of breath.  CHEST - 2 VIEW  Comparison: None.  Findings: Shallow inspiration.  Borderline heart size with normal pulmonary vascularity.  Bilateral pleural effusions with basilar atelectasis or infiltration in the bases of the lungs.  No pneumothorax.  Mediastinal contours appear intact.  Degenerative changes in the spine.  IMPRESSION: Bilateral pleural effusions with basilar atelectasis or infiltration.   Original Report Authenticated By: Neale Burly, M.D.    Dg Chest 2 View  07/20/2012  *RADIOLOGY REPORT*  Clinical Data: Shortness of breath, diabetes, hypertension  CHEST - 2 VIEW  Comparison: 05/17/2010   Findings: Moderate pleural effusions with mid and lower lung diffuse airspace disease versus edema.  Heart is enlarged.  CHF is favored over pneumonia.  No pneumothorax.  Trachea is midline.  IMPRESSION: Cardiomegaly with diffuse airspace disease versus edema and effusions.   Original Report Authenticated By: Jerilynn Mages. Daryll Brod, M.D.    Ct Angio Chest W/cm &/or Wo Cm  07/21/2012  *RADIOLOGY REPORT*  Clinical Data: Shortness of breath for 1 week.  Lower extremity edema.  CT ANGIOGRAPHY CHEST  Technique:  Multidetector CT imaging of the chest using the standard protocol during bolus administration of intravenous contrast. Multiplanar reconstructed images including MIPs were obtained and reviewed to evaluate the vascular anatomy.  Contrast: 132mL OMNIPAQUE IOHEXOL 350 MG/ML SOLN  Comparison: None.  Findings: Technically adequate study with moderately good opacification of the central and segmental pulmonary arteries.  No focal filling defects are demonstrated.  No evidence of significant pulmonary embolus.  Normal heart size.  Normal caliber thoracic aorta.  No significant lymphadenopathy in the chest.  Moderate sized bilateral pleural effusions with basilar atelectasis or infiltration bilaterally.  Respiratory motion artifact limits visualization of the lung fields.  No pneumothorax.  Airways appear patent.  Degenerative changes in the thoracic spine.  Visualized portions of the upper abdominal organs are grossly unremarkable.  IMPRESSION: No evidence of significant pulmonary embolus.  Moderate sized bilateral pleural effusions with bilateral basilar atelectasis or infiltration.   Original Report Authenticated By: Neale Burly, M.D.     PHYSICAL EXAM General: NAD Neck: No JVD, no thyromegaly or thyroid nodule.  Lungs: Slight crackles at bases bilaterally.  CV: Nondisplaced PMI.  Heart regular S1/S2, no S3/S4, no murmur.  1+ ankle edema.  No carotid bruit.  Normal pedal pulses.  Abdomen: Soft, nontender,  no hepatosplenomegaly, no distention.  Neurologic: Alert and oriented x 3.  Psych: Normal affect. Extremities: No clubbing or cyanosis. Right groin cath site benign.   TELEMETRY: Reviewed telemetry pt in NSR  ASSESSMENT AND PLAN:  56 yo presented with acute systolic CHF in setting of out of hospital anterior MI.  EF 20-25% on echo with akinesis of most mid to apical LV wall segments.  1. CAD: Cath yesterday with total occlusion of LAD and diffuse disease in  LCx.  Will have MRI today to assess for viability in LAD territory.  If there is significant viability, CABG would likely be best option given the diffuse nature of disease in her LAD and LCx.  Continue ASA, would change to high dose statin for now.  2. CHF: Acute on chronic systolic CHF.  Volume status improved, breathing better and JVP not significantly elevated.  - Change Lasix to 40 mg po daily.  - Continue lisinopril and spironolactone.  - Will add low dose Coreg, will need to watch BP closely though with this addition.  - Given wide LBBB, will likely be good candidate for CRT in the future.   [name] 07/22/2012 8:31 AM  Reviewed cath films and MRI.  The LAD is totally occluded with faint collaterals from the RCA.  LAD territory actually does appear viable on MRI.  Will discuss with CVTS and interventional colleagues what will be the best approach here.   [name] 07/22/2012 2:45 PM

## 2012-07-22 NOTE — Progress Notes (Signed)
PGY-1 Daily Progress Note Family Medicine Teaching Service [place] R. Milton Sagona, DO Service Pager: [phone]   Subjective: Pt is doing ok today.  Went for heart cath yesterday and showed three vessel disease.  Is anxious about this and where to go from here.   Objective:  VITALS Temp:  [97.4 F (36.3 C)-98.6 F (37 C)] 97.5 F (36.4 C) (10/16 0745) Pulse Rate:  [89-113] 89  (10/16 0745) Resp:  [14-23] 16  (10/16 0745) BP: (90-141)/(49-84) 97/57 mmHg (10/16 0745) SpO2:  [92 %-96 %] 93 % (10/16 0745) Weight:  [279 lb 15.8 oz (127 kg)-280 lb (127.007 kg)] 280 lb (127.007 kg) (10/16 0500)  In/Out  Intake/Output Summary (Last 24 hours) at 07/22/12 0842 Last data filed at 07/22/12 0747  Gross per 24 hour  Intake    672 ml  Output   2450 ml  Net  -1778 ml    Physical Exam: General: NAD  Neck: No JVD, no thyromegaly or thyroid nodule.  Lungs: Slight crackles at bases bilaterally.  CV: Nondisplaced PMI. Heart regular S1/S2, +S3 no S4. 1+ ankle edema. No carotid bruit. Normal pedal pulses.  Abdomen: Soft, nontender, no hepatosplenomegaly, no distention.  Neurologic: Alert and oriented x 3.  Psych: Normal affect.  Extremities: No clubbing or cyanosis. Right groin cath site benign.  MEDS Scheduled Meds:    . antiseptic oral rinse  15 mL Mouth Rinse BID  . aspirin      . aspirin  324 mg Oral Pre-Cath  . aspirin EC  81 mg Oral Daily  . carvedilol  3.125 mg Oral BID WC  . digoxin  0.125 mg Oral Daily  . enoxaparin  40 mg Subcutaneous Q24H  . fentaNYL      . furosemide  40 mg Oral Daily  . gabapentin  600 mg Oral TID  . heparin      . influenza  inactive virus vaccine  0.5 mL Intramuscular Tomorrow-1000  . insulin aspart  0-5 Units Subcutaneous QHS  . insulin aspart  0-9 Units Subcutaneous TID WC  . lidocaine      . lisinopril  5 mg Oral BID  . midazolam      . nitroGLYCERIN  0.5 inch Topical Q6H  . nitroGLYCERIN      . nortriptyline  50 mg Oral QHS  . potassium chloride  20  mEq Oral Once  . simvastatin  20 mg Oral q1800  . sodium chloride  3 mL Intravenous Q12H  . spironolactone  12.5 mg Oral Daily  . DISCONTD: furosemide  40 mg Intravenous BID  . DISCONTD: heparin subcutaneous  5,000 Units Subcutaneous Q8H  . DISCONTD: sodium chloride  3 mL Intravenous Q12H   Continuous Infusions:   . sodium chloride 75 mL/hr at 07/21/12 1558  . DISCONTD: heparin Stopped (07/21/12 1248)   PRN Meds:.sodium chloride, acetaminophen, cyclobenzaprine, nitroGLYCERIN, ondansetron (ZOFRAN) IV, perflutren lipid microspheres (DEFINITY) IV suspension, sodium chloride, traMADol, DISCONTD: sodium chloride, DISCONTD: acetaminophen, DISCONTD: ondansetron (ZOFRAN) IV, DISCONTD: sodium chloride  Labs and imaging:   CBC  Lab 07/22/12 0515 07/21/12 1750 07/21/12 1153  WBC 7.9 7.1 9.0  HGB 11.0* 11.7* 13.2  HCT 33.9* 36.1 39.1  PLT 227 209 250   BMET/CMET  Lab 07/22/12 0515 07/21/12 1750 07/20/12 2029 07/20/12 1854  NA 137 -- 138 139  K 3.6 -- 4.4 4.7  CL 102 -- 103 105  CO2 25 -- 22 23  BUN 20 -- 18 17  CREATININE 1.14* 0.93 1.03 --  CALCIUM 8.4 --  9.2 8.8  PROT -- -- -- 7.0  BILITOT -- -- -- 0.3  ALKPHOS -- -- -- 96  ALT -- -- -- 8  AST -- -- -- 12  GLUCOSE 181* -- 239* 252*    Dg Chest 2 View  07/21/2012   IMPRESSION: Bilateral pleural effusions with basilar atelectasis or infiltration.   Original Report Authenticated By: Neale Burly, M.D.    Dg Chest 2 View   IMPRESSION: Cardiomegaly with diffuse airspace disease versus edema and effusions.   Original Report Authenticated By: Jerilynn Mages. Daryll Brod, M.D.    Ct Angio Chest W/cm &/or Wo Cm  07/21/2012    IMPRESSION: No evidence of significant pulmonary embolus.  Moderate sized bilateral pleural effusions with bilateral basilar atelectasis or infiltration.   Original Report Authenticated By: Neale Burly, M.D.    ECHO 07/21/2012  Study Conclusions  - Left ventricle: The cavity size was mildly dilated.  Wall thickness was normal. Systolic function was severely reduced. The estimated ejection fraction was in the range of 20% to 25%. The entire mid to distal 2/3 of the ventricle is severely hypokinetic/akinetic. - Mitral valve: Mild regurgitation. - Pericardium, extracardiac: A small, free-flowing pericardial effusion was identified along the right atrial free wall.  Cardia Cath 07/21/12   Left main: Long. Mild plaque  LAD: Completely occluded proximally after first diagonal. 1st diagonal 30-40% ostial. Diffuse diabetic vasculopathy long 95-99% in midsection  LCX: Small vessel (1.50mm) with severe diffuse diabetic vasculopathy. Ostial 80%  RAMUS: Moderate sized vessel. Diffuse 30%  RCA: Very large dominant vessel. Diffuse 40% in midsection.. 30% around distal bend. PDA moderate sized vessel subtotally occluded. 2 large PLs with mild plaque. Very fain collateral to LAD.  LV-gram done in the RAO projection: Ejection fraction = 15% Distal 2/3 of ventricle is akinetic  Assessment:  1. Severe 3v CAD with totally occluded LAD  2. Severe LV dysfunction  3. Mildly elevated filling pressures with preserved cardiac output   Assessment/Plan:  [name] is a 56 y.o. year old female with history of DM type 2, HLD presenting with 2 weeks worsening exertional dyspnea found to have new onset acute congestive heart failure.   1. Acute decompensated CHF NYHA Stage III, EF 15% - Heart failure team consulted yesterday due to worsening SOB, pitting edema.  Echo was done showing 20-25% EF with LV severely hypokinetic, 2/3 lower aspectic akinetic.   1) Pt taken to cath yesterday showing severe three vessel disease with completely occluded LAD.  Akinetic LV and EF of 15%  2) Plan for MRI today for evaluation of viability of cardiac tissue  3) Will need aggressive HF tx in outpt setting and possible LifeVest according to Dr. Haroldine Laws  4) Continue to follow daily weights, I/O.  Switch to Lasix 40 mg PO.   Weight stable around 280 today.   2.Recent STEMI with new onset LBBB / CAD- Recent changes in EKG with elevation in troponin's.  1) Pt taken to cath yesterday showing complete occlusion of LAD with diabetic vasculopathy  2) Most likely had a recent silent MI within the past several weeks  3) At this point, not a good candidate for CABG due to multiple co morbidities and EF of 15%.   However, if MRI today shows significant viability of cardiac tissue, CABG is an option   4) Will f/u with cardiology with further recommendations   5) Continue ASA, start low dose BB, increase ACE and add Aldactone.  Will increase Statin dose.  Nitro PRN. Added on Digoxin 0.125 mg qd.   3. Diabetes mellitus type 2. Last A1c 7.7. However, previously severely uncontrolled in previous years according to Dr. Nori Riis.   1)Sliding scale moderate without qhs coverage. CBG monitoring   2) Holding Metformin due to cath.  Will consider restarting 48 hrs after.  Consider adding back on Glucotrol 5 mg  4) Hyperlipidemia - LDL of 58 but complete occlusion of LAD. Will continue on statin  4. 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 Continue neurontin and flexeril prn at home dose.  Also continue Pamelor 50 mg qd.   FEN/GI: heart healthy diet. zofran prn. Saline lock.  Prophylaxis: Lovenox 40 mg SQ qd Disposition: Patient and husband informed of treatment plan.  Will follow cardiology recommendations about further management and outpt management.  Code Status: DNR per living will and talk with pt.   Tamela Oddi Awanda Mink, DO of [facility] 07/22/2012, 2:12 PM

## 2012-07-23 ENCOUNTER — Other Ambulatory Visit: Payer: Self-pay | Admitting: *Deleted

## 2012-07-23 ENCOUNTER — Encounter (HOSPITAL_COMMUNITY): Payer: BC Managed Care – PPO

## 2012-07-23 ENCOUNTER — Encounter (HOSPITAL_COMMUNITY)
Admission: EM | Disposition: A | Discharge status: Home / Self Care | Payer: Self-pay | Source: Home / Self Care | Attending: Family Medicine

## 2012-07-23 DIAGNOSIS — I2510 Atherosclerotic heart disease of native coronary artery without angina pectoris: Secondary | ICD-10-CM

## 2012-07-23 DIAGNOSIS — I509 Heart failure, unspecified: Secondary | ICD-10-CM

## 2012-07-23 HISTORY — PX: PERCUTANEOUS CORONARY STENT INTERVENTION (PCI-S): SHX5485

## 2012-07-23 LAB — BASIC METABOLIC PANEL
Chloride: 104 mEq/L (ref 96–112)
Creatinine, Ser: 1.11 mg/dL — ABNORMAL HIGH (ref 0.50–1.10)
GFR calc Af Amer: 62 mL/min — ABNORMAL LOW (ref >90)
Potassium: 3.7 mEq/L (ref 3.5–5.1)
Sodium: 140 mEq/L (ref 135–145)

## 2012-07-23 LAB — POCT ACTIVATED CLOTTING TIME: Activated Clotting Time: 434 seconds

## 2012-07-23 LAB — CBC
HCT: 33.9 % — ABNORMAL LOW (ref 36.0–46.0)
RDW: 13.6 % (ref 11.5–15.5)
WBC: 8.4 10*3/uL (ref 4.0–10.5)

## 2012-07-23 LAB — GLUCOSE, CAPILLARY: Glucose-Capillary: 202 mg/dL — ABNORMAL HIGH (ref 70–99)

## 2012-07-23 LAB — PROTIME-INR: Prothrombin Time: 14.5 seconds (ref 11.6–15.2)

## 2012-07-23 SURGERY — PERCUTANEOUS CORONARY STENT INTERVENTION (PCI-S)
Anesthesia: LOCAL

## 2012-07-23 MED ORDER — VERAPAMIL HCL 2.5 MG/ML IV SOLN
INTRAVENOUS | Status: AC
  Filled 2012-07-23: qty 2

## 2012-07-23 MED ORDER — SODIUM CHLORIDE 0.9 % IV SOLN: INTRAVENOUS | Status: AC

## 2012-07-23 MED ORDER — BIVALIRUDIN 250 MG IV SOLR
INTRAVENOUS | Status: AC
  Filled 2012-07-23: qty 250

## 2012-07-23 MED ORDER — FENTANYL CITRATE 0.05 MG/ML IJ SOLN
INTRAMUSCULAR | Status: AC
  Filled 2012-07-23: qty 2

## 2012-07-23 MED ORDER — OXYCODONE-ACETAMINOPHEN 5-325 MG PO TABS: 1.0000 | ORAL_TABLET | ORAL | Status: DC | PRN

## 2012-07-23 MED ORDER — SODIUM CHLORIDE 0.9 % IV SOLN
0.1000 mg/kg/h | INTRAVENOUS | Status: DC
  Filled 2012-07-23: qty 250

## 2012-07-23 MED ORDER — ACETAMINOPHEN 325 MG PO TABS: 650.0000 mg | ORAL_TABLET | ORAL | Status: DC | PRN

## 2012-07-23 MED ORDER — ONDANSETRON HCL 4 MG/2ML IJ SOLN: 4.0000 mg | Freq: Four times a day (QID) | INTRAMUSCULAR | Status: DC | PRN

## 2012-07-23 MED ORDER — MIDAZOLAM HCL 2 MG/2ML IJ SOLN
INTRAMUSCULAR | Status: AC
  Filled 2012-07-23: qty 2

## 2012-07-23 MED ORDER — NITROGLYCERIN 0.2 MG/ML ON CALL CATH LAB
INTRAVENOUS | Status: AC
  Filled 2012-07-23: qty 1

## 2012-07-23 MED ORDER — SODIUM CHLORIDE 0.9 % IV SOLN
0.2500 mg/kg/h | INTRAVENOUS | Status: DC
  Filled 2012-07-23: qty 250

## 2012-07-23 MED ORDER — LIDOCAINE HCL (PF) 1 % IJ SOLN
INTRAMUSCULAR | Status: AC
  Filled 2012-07-23: qty 30

## 2012-07-23 MED ORDER — HEPARIN (PORCINE) IN NACL 2-0.9 UNIT/ML-% IJ SOLN
INTRAMUSCULAR | Status: AC
  Filled 2012-07-23: qty 1500

## 2012-07-23 NOTE — Progress Notes (Signed)
Right groin sheath removed by cath lab staff, tolerated well , bedrest emphasized. Continue to monitor,.

## 2012-07-23 NOTE — Interval H&P Note (Signed)
History and Physical Interval Note:  07/23/2012 11:28 AM  [name]  has presented today for surgery, with the diagnosis of cp  The various methods of treatment have been discussed with the patient and family. After consideration of risks, benefits and other options for treatment, the patient has consented to  Procedure(s) (LRB) with comments: PERCUTANEOUS CORONARY STENT INTERVENTION (PCI-S) (N/A) as a surgical intervention .  The patient's history has been reviewed, patient examined, no change in status, stable for surgery.  I have reviewed the patient's chart and labs.  Questions were answered to the patient's satisfaction.     Sherren Mocha

## 2012-07-23 NOTE — Progress Notes (Signed)
PGY-1 Daily Progress Note Family Medicine Teaching Service [place] R. Chelan Heringer, DO Service Pager: [phone]   Subjective: Pt going for repeat cath with PCI today.  She is anxious about the procedure and has lots of questions which have been answered by Dr. Burt Knack.   Objective:  VITALS Temp:  [97.2 F (36.2 C)-98.3 F (36.8 C)] 98.2 F (36.8 C) (10/17 0725) Pulse Rate:  [88-103] 89  (10/17 0725) Resp:  [16-21] 16  (10/17 0725) BP: (98-116)/(56-73) 113/63 mmHg (10/17 0725) SpO2:  [94 %-97 %] 95 % (10/17 0725) Weight:  [279 lb 15.8 oz (127 kg)] 279 lb 15.8 oz (127 kg) (10/17 0500)  In/Out  Intake/Output Summary (Last 24 hours) at 07/23/12 0904 Last data filed at 07/23/12 0800  Gross per 24 hour  Intake    940 ml  Output   3100 ml  Net  -2160 ml    Physical Exam: General: NAD  Neck: No JVD, no thyromegaly or thyroid nodule.  Lungs: Slight crackles at bases bilaterally.  CV: Nondisplaced PMI. Heart regular S1/S2, +S3 no S4. 1+ ankle edema. No carotid bruit. Normal pedal pulses.  Abdomen: Soft, nontender, no hepatosplenomegaly, no distention.  Neurologic: Alert and oriented x 3.  Psych: Normal affect.  Extremities: No clubbing or cyanosis. Right groin cath site benign.  MEDS Scheduled Meds:    . antiseptic oral rinse  15 mL Mouth Rinse BID  . aspirin EC  81 mg Oral Daily  . carvedilol  3.125 mg Oral BID WC  . diazepam  5 mg Oral On Call  . digoxin  0.125 mg Oral Daily  . enoxaparin  40 mg Subcutaneous Q24H  . furosemide  40 mg Oral Daily  . gabapentin  600 mg Oral TID  . gadobenate dimeglumine  30 mL Intravenous Once  . insulin aspart  0-20 Units Subcutaneous TID WC  . insulin aspart  0-5 Units Subcutaneous QHS  . lisinopril  5 mg Oral BID  . nitroGLYCERIN  0.5 inch Topical Q6H  . nortriptyline  50 mg Oral QHS  . potassium chloride  20 mEq Oral Once  . simvastatin  20 mg Oral q1800  . sodium chloride  3 mL Intravenous Q12H  . sodium chloride  3 mL Intravenous Q12H  .  spironolactone  12.5 mg Oral Daily  . Ticagrelor  180 mg Oral Once  . Ticagrelor  90 mg Oral BID  . DISCONTD: insulin aspart  0-5 Units Subcutaneous QHS  . DISCONTD: insulin aspart  0-9 Units Subcutaneous TID WC  . DISCONTD: potassium chloride  20 mEq Oral Once   Continuous Infusions:    . sodium chloride 50 mL/hr at 07/23/12 0630   PRN Meds:.sodium chloride, sodium chloride, acetaminophen, cyclobenzaprine, nitroGLYCERIN, ondansetron (ZOFRAN) IV, sodium chloride, sodium chloride, traMADol  Labs and imaging:   CBC  Lab 07/23/12 0440 07/22/12 0515 07/21/12 1750  WBC 8.4 7.9 7.1  HGB 11.3* 11.0* 11.7*  HCT 33.9* 33.9* 36.1  PLT 213 227 209   BMET/CMET  Lab 07/23/12 0440 07/22/12 0515 07/21/12 1750 07/20/12 2029 07/20/12 1854  NA 140 137 -- 138 --  K 3.7 3.6 -- 4.4 --  CL 104 102 -- 103 --  CO2 24 25 -- 22 --  BUN 23 20 -- 18 --  CREATININE 1.11* 1.14* 0.93 -- --  CALCIUM 8.4 8.4 -- 9.2 --  PROT -- -- -- -- 7.0  BILITOT -- -- -- -- 0.3  ALKPHOS -- -- -- -- 96  ALT -- -- -- --  8  AST -- -- -- -- 12  GLUCOSE 189* 181* -- 239* --    Dg Chest 2 View  07/21/2012   IMPRESSION: Bilateral pleural effusions with basilar atelectasis or infiltration.   Original Report Authenticated By: Neale Burly, M.D.    Dg Chest 2 View   IMPRESSION: Cardiomegaly with diffuse airspace disease versus edema and effusions.   Original Report Authenticated By: Jerilynn Mages. Daryll Brod, M.D.    Ct Angio Chest W/cm &/or Wo Cm  07/21/2012    IMPRESSION: No evidence of significant pulmonary embolus.  Moderate sized bilateral pleural effusions with bilateral basilar atelectasis or infiltration.   Original Report Authenticated By: Neale Burly, M.D.    ECHO 07/21/2012  Study Conclusions  - Left ventricle: The cavity size was mildly dilated. Wall thickness was normal. Systolic function was severely reduced. The estimated ejection fraction was in the range of 20% to 25%. The entire mid to distal  2/3 of the ventricle is severely hypokinetic/akinetic. - Mitral valve: Mild regurgitation. - Pericardium, extracardiac: A small, free-flowing pericardial effusion was identified along the right atrial free wall.  Cardia Cath 07/21/12   Left main: Long. Mild plaque  LAD: Completely occluded proximally after first diagonal. 1st diagonal 30-40% ostial. Diffuse diabetic vasculopathy long 95-99% in midsection  LCX: Small vessel (1.48mm) with severe diffuse diabetic vasculopathy. Ostial 80%  RAMUS: Moderate sized vessel. Diffuse 30%  RCA: Very large dominant vessel. Diffuse 40% in midsection.. 30% around distal bend. PDA moderate sized vessel subtotally occluded. 2 large PLs with mild plaque. Very fain collateral to LAD.  LV-gram done in the RAO projection: Ejection fraction = 15% Distal 2/3 of ventricle is akinetic  Assessment:  1. Severe 3v CAD with totally occluded LAD  2. Severe LV dysfunction  3. Mildly elevated filling pressures with preserved cardiac output   Cardiac MRI 07/22/12: IMPRESSION:  1. Normal LV size with severe systolic dysfunction, EF 123456. There  were wall motion abnormalities as described above.  2. RV normal in size and systolic function.  3. On delayed enhancement imaging, there was 26-50% wall thickness  subendocardial enhancement in the mid to apical inferoseptal,  anteroseptal, and anterior walls. This suggests that the LV  segments with wall motion abnormalities may improve in function  with revascularization (suggests viability).   Assessment/Plan:  [name] is a 56 y.o. year old female with history of DM type 2, HLD presenting with 2 weeks worsening exertional dyspnea found to have new onset acute congestive heart failure.   1. Acute decompensated CHF NYHA Stage III, EF 15% - Heart failure team consulted yesterday due to worsening SOB, pitting edema.  Echo was done showing 20-25% EF with LV severely hypokinetic, 2/3 lower aspectic akinetic.   1) Pt  taken to cath on 10/15 showing severe three vessel disease with completely occluded LAD.  Akinetic LV and EF of 15%  2) MRI perfromed on 10/16 showed possible viable tissue with revascularization. Dr. Burt Knack spoke with pt and plan is for PCI with stent to LAD if possible.  If not, medical management as he does not feel she is a good candidate for CABG.   3) Will need aggressive HF tx in outpt setting and possible LifeVest according to Dr. Haroldine Laws  4) Continue to follow daily weights, I/O.  Switch to Lasix 40 mg PO.  Weight stable around 280 today.   2.Recent STEMI with new onset LBBB / CAD- Recent changes in EKG with elevation in troponin's.  1) Pt  taken to cath 10/15 showing complete occlusion of LAD with diabetic vasculopathy  2) Most likely had a recent silent MI within the past several weeks  3) Going for PCI today per Dr. Burt Knack.   4) Will f/u with cardiology with further recommendations   5) Continue ASA, start low dose BB, increase ACE and add Aldactone.  Will increase Statin dose. Nitro PRN. Added on Digoxin 0.125 mg qd.   3. Diabetes mellitus type 2. Last A1c 7.7. However, previously severely uncontrolled in previous years according to Dr. Nori Riis.   1)Sliding scale moderate without qhs coverage. CBG monitoring   2) Holding Metformin due to cath.  Will consider restarting 48 hrs after.  Consider adding back on Glucotrol 5 mg  4) Hyperlipidemia - LDL of 58 but complete occlusion of LAD. Will continue on statin  4. Neuropathic pain Continue neurontin and flexeril prn at home dose.  Also continue Pamelor 50 mg qd.   FEN/GI: heart healthy diet. zofran prn. Saline lock.  Prophylaxis: Lovenox 40 mg SQ qd Disposition: Patient and husband informed of treatment plan.  Will follow cardiology recommendations s/p PCI today.  Code Status: DNR per living will and talk with pt.   Tamela Oddi Awanda Mink, DO of [facility] 07/23/2012, 8:17 AM

## 2012-07-23 NOTE — Progress Notes (Signed)
FMTS Attending Note Patient seen and examined by me; she reports no pain and a marked improvement in the dyspnea which prompted her initial presentation.  I have reviewed Dr Antionette Char note; patient to return to cath lab today for PCI.   Further recommendations re. CAD/CHF pending cardiology recommendations.   Dalbert Mayotte, MD

## 2012-07-23 NOTE — Care Management Note (Signed)
    Page 1 of 1   07/23/2012     1:07:50 PM   CARE MANAGEMENT NOTE 07/23/2012  Patient:  [name], [name]   Account Number:  [id number]  Date Initiated:  07/21/2012  Documentation initiated by:  Elissa Hefty  Subjective/Objective Assessment:   adm w chf     Action/Plan:   lives w Environmental health practitioner for hhc as pt progresses   Anticipated DC Date:     Anticipated DC Plan:  [place]  CM consult      Choice offered to / List presented to:             Status of service:   Medicare Important Message given?   (If response is "NO", the following Medicare IM given date fields will be blank) Date Medicare IM given:   Date Additional Medicare IM given:    Discharge Disposition:    Per UR Regulation:  Reviewed for med. necessity/level of care/duration of stay  If discussed at [place] of Stay Meetings, dates discussed:    Comments:  10/17 13:00 debbie Christobal Morado rn,bsn staged pci, copay for brilinta 64.00 per month. will give pt copay assist card and 30days free.  10/15 16:45p debbie Bohden Dung rn,bsn [id number]

## 2012-07-23 NOTE — Discharge Summary (Signed)
Physician Discharge Summary  Patient ID: [name] MRN: [id number] DOB: 10/26/1955 Age: 56 y.o.  Admit date: 07/20/2012 Discharge date: 07/27/2012 Admitting Physician: Dickie La, MD  PCP: No primary provider on file.  Consultants:Cardiology, Cardiology Heart Failure      Discharge Diagnosis: Principal Problem:  *Acute CHF (congestive heart failure) Active Problems:  Diabetes mellitus type 2 in obese  Hypoxia  HLD (hyperlipidemia)  Acute systolic heart failure  Acute myocardial infarction, subendocardial infarction, initial episode of care  Left bundle branch block  CAD (coronary artery disease)    Hospital Course [name] is a 56 y.o. year old female with history of DM type 2, HLD presenting with 2 weeks worsening exertional dyspnea found to have new onset acute congestive heart failure.   1. Acute decompensated CHF NYHA Stage III, EF 15% - Pt presented to the ED with two weeks of worsening dyspnea at rest and with exacerbation.  Upon presentation to the ED, she had cardiac labs and an EKG perfromed.  The EKG showed new onset LBBB and her cardiac markers showed an elevation in troponins to 0.64, second set to 0.79, and third set trending down to 0.48.  She also had +1 pitting edema on exam with < 5 cm JVD.  She was started on a heparin drip due to the elevated troponins along with lisinopril 5 mg bid, ASA loading dose followed by daily ASA, and her Beta Blocker was held due to possible acute CHF. Cardiology/heart failure was consulted and they also recommended starting her on digoxin 0.125 mcg and to restart her Coreg at low dose after further imaging/resolution of her acute CHF.  An Echo was ordered and it showed an EF of 20-25% with akinetic LV of the lower 2/3rds.  Due to the results of the echo, she was taken straight to the cath lab and this showed complete blockage of the LAD, partial occlusion of the LCx, and akinetic LV lower 2/3rd.  Due to the low EF and  decreased perfusion, an cardiac MRI was ordered to evaluate for viable tissue for further management.  The MRI showed possible viable tissue of the LV and she was taken back to the cath lab for PCI with possible stenting of the LAD. Pt had a stent placed to the LAD and recommended Brilanta and ASA for 12 months.  She was followed in the hospital until she reached her dry weight of around 278 by giving her IV lasix.  Upon discharge she was told to follow up with heart failure clinic and cardiology who planned on placing a stent to the LCx artery in 4 weeks.    2.Recent MI(STEMI vs NSTEMI) with new onset LBBB / CAD- See above.  Also, in the cath lab, it was determined she had a recent MI within the past month.  With her elevated troponins and evaluation on the cath, they could not pinpoint an exact period.  However, her extensive diabetes history, previously severely uncontrolled, probably contributed to a diabetic vasculopathy causing a silent MI.  She was followed by cardiology and as above d/c on medications with close f/u in heart failure clinic.   3. Diabetes mellitus type 2. Last A1C before admission was 7.7.  Her home metformin and glucotrol were held on admission and held for most of her admission due to cath with radioactive dye.  She was placed on a moderate sliding scale insulin with CBG monitoring and her sugars were between 150-300's.    4. Hyperlipidemia -  Her LDL of 58 but complete occlusion of LAD. She was continued on a statin and sent home on this as well.   5. Neuropathic pain -  Stable, Continued home meds.          Discharge PE   Filed Vitals:   07/26/12 0531  BP: 95/45  Pulse: 81  Temp: 97.5 F (36.4 C)  Resp: 18    General: NAD  Neck: No JVD, no thyromegaly or thyroid nodule.  Lungs: Slight crackles at bases bilaterally.  CV: Nondisplaced PMI. Heart regular S1/S2, +S3 no S4. 1+ ankle edema. No carotid bruit. Normal pedal pulses.  Abdomen: Soft, nontender, no  hepatosplenomegaly, no distention.  Neurologic: Alert and oriented x 3.  Psych: Normal affect.  Extremities: No clubbing or cyanosis. Right groin cath site benign.  Labs  CBC  Lab 07/26/12 0605 07/25/12 0645 07/24/12 0445  WBC 7.1 6.7 7.7  HGB 10.8* 10.9* 11.2*  HCT 33.2* 33.1* 34.7*  PLT 200 176 195   BMET  Lab 07/25/12 0947 07/24/12 0445 07/23/12 0440 07/20/12 1854  NA 135 139 140 --  K 3.9 4.1 3.7 --  CL 100 106 104 --  CO2 24 24 24  --  BUN 24* 21 23 --  CREATININE 1.15* 1.07 1.11* --  CALCIUM 8.6 8.6 8.4 --  PROT -- -- -- 7.0  BILITOT -- -- -- 0.3  ALKPHOS -- -- -- 96  ALT -- -- -- 8  AST -- -- -- 12  GLUCOSE 326* 192* 189* --   Lab Results  Component Value Date   CKTOTAL 90 07/21/2012   CKMB 5.0* 07/21/2012   TROPONINI 0.48* 07/21/2012   Ct Angio Chest W/cm &/or Wo Cm  07/21/2012 IMPRESSION: No evidence of significant pulmonary embolus. Moderate sized bilateral pleural effusions with bilateral basilar atelectasis or infiltration. Original Report Authenticated By: Neale Burly, M.D.   ECHO 07/21/2012  Study Conclusions  - Left ventricle: The cavity size was mildly dilated. Wall thickness was normal. Systolic function was severely reduced. The estimated ejection fraction was in the range of 20% to 25%. The entire mid to distal 2/3 of the ventricle is severely hypokinetic/akinetic. - Mitral valve: Mild regurgitation. - Pericardium, extracardiac: A small, free-flowing pericardial effusion was identified along the right atrial free wall.  Cardia Cath 07/21/12   Left main: Long. Mild plaque  LAD: Completely occluded proximally after first diagonal. 1st diagonal 30-40% ostial. Diffuse diabetic vasculopathy long 95-99% in midsection  LCX: Small vessel (1.33mm) with severe diffuse diabetic vasculopathy. Ostial 80%  RAMUS: Moderate sized vessel. Diffuse 30%  RCA: Very large dominant vessel. Diffuse 40% in midsection.. 30% around distal bend. PDA moderate  sized vessel subtotally occluded. 2 large PLs with mild plaque. Very fain collateral to LAD.  LV-gram done in the RAO projection: Ejection fraction = 15% Distal 2/3 of ventricle is akinetic  Assessment:  1. Severe 3v CAD with totally occluded LAD  2. Severe LV dysfunction  3. Mildly elevated filling pressures with preserved cardiac output   Cardiac MRI 07/22/12:  IMPRESSION:  1. Normal LV size with severe systolic dysfunction, EF 123456. There  were wall motion abnormalities as described above.  2. RV normal in size and systolic function.  3. On delayed enhancement imaging, there was 26-50% wall thickness  subendocardial enhancement in the mid to apical inferoseptal,  anteroseptal, and anterior walls. This suggests that the LV  segments with wall motion abnormalities may improve in function  with revascularization (suggests viability).  Patient condition at time of discharge/disposition: stable  Disposition-home   Follow up issues: 1. F/U cardiology recommendations for heart failure/CAD  Discharge follow up:   Discharge Orders    Future Appointments: Provider: Department: Dept Phone: Center:   07/29/2012 11:00 AM [facility] [phone] None   09/07/2012 9:00 AM [place], MD [facility] [phone] [facility]     Future Orders Please Complete By Expires   Amb Referral to Cardiac Rehabilitation      Comments:   Referring to [facility] Phase 2   Diet - low sodium heart healthy      Increase activity slowly        Follow-up Information    Follow up with Glori Bickers, MD. On 07/29/2012. (at 11  (gate Code 0800))    Contact information:   [place] [address] [phone]       Follow up with Dr. Tamala Julian. (Please follow up with Dr[facility] Tamala Julian at [facility] within the next week. )           Discharge Instructions: Please refer to Patient Instructions section of EMR for full details.  Patient was counseled  important signs and symptoms that should prompt return to medical care, changes in medications, dietary instructions, activity restrictions, and follow up appointments.  Significant instructions noted below:    Discharge Medications   Medication List     As of 07/27/2012  8:27 AM    START taking these medications         carvedilol 3.125 MG tablet   Commonly known as: COREG   Take 1 tablet (3.125 mg total) by mouth 2 (two) times daily with a meal.      digoxin 0.125 MG tablet   Commonly known as: LANOXIN   Take 1 tablet (0.125 mg total) by mouth daily.      lisinopril 5 MG tablet   Commonly known as: PRINIVIL,ZESTRIL   Take 1 tablet (5 mg total) by mouth 2 (two) times daily.      nitroGLYCERIN 0.4 MG SL tablet   Commonly known as: NITROSTAT   Place 1 tablet (0.4 mg total) under the tongue every 5 (five) minutes as needed for chest pain.      potassium chloride 10 MEQ tablet   Commonly known as: K-DUR   Take 2 tablets (20 mEq total) by mouth daily.      spironolactone 25 MG tablet   Commonly known as: ALDACTONE   Take 1 tablet (25 mg total) by mouth daily.      Ticagrelor 90 MG Tabs tablet   Commonly known as: BRILINTA   Take 1 tablet (90 mg total) by mouth 2 (two) times daily.      torsemide 20 MG tablet   Commonly known as: DEMADEX   Take 2 tablets (40 mg total) by mouth daily.      CONTINUE taking these medications         aspirin EC 81 MG tablet      cholecalciferol 1000 UNITS tablet   Commonly known as: VITAMIN D      cyclobenzaprine 10 MG tablet   Commonly known as: FLEXERIL      gabapentin 600 MG tablet   Commonly known as: NEURONTIN   Take 1 tablet (600 mg total) by mouth 3 (three) times daily.      glimepiride 4 MG tablet   Commonly known as: AMARYL   Take 1 tablet (4 mg total)  by mouth 2 (two) times daily before a meal.      glucose blood test strip      Lancets Misc      metFORMIN 1000 MG tablet   Commonly known as: GLUCOPHAGE   Take 1  tablet (1,000 mg total) by mouth 2 (two) times daily with a meal.      nortriptyline 50 MG capsule   Commonly known as: PAMELOR   Take 1 capsule (50 mg total) by mouth at bedtime.      pravastatin 20 MG tablet   Commonly known as: PRAVACHOL   Take 1 tablet (20 mg total) by mouth at bedtime.      traMADol 50 MG tablet   Commonly known as: ULTRAM          Where to get your medications    These are the prescriptions that you need to pick up. We sent them to a specific pharmacy, so you will need to go there to get them.   [id number] - [place] ([place]),  - [address]    [address] [place] ([place]) [place] [address]    Phone: [phone]        carvedilol 3.125 MG tablet   digoxin 0.125 MG tablet   lisinopril 5 MG tablet   nitroGLYCERIN 0.4 MG SL tablet   potassium chloride 10 MEQ tablet   spironolactone 25 MG tablet   Ticagrelor 90 MG Tabs tablet   torsemide 20 MG tablet                Kennith Maes, DO of [facility] 07/27/2012 8:18 AM

## 2012-07-23 NOTE — Progress Notes (Signed)
To the cath lab by bed stable.

## 2012-07-23 NOTE — CV Procedure (Signed)
CARDIAC CATH NOTE  Name: [name] MRN: [id number] DOB: Feb 28, 1956  Procedure: Complex PTCA and stenting of the LAD (chronic total occlusion)  Indication: Congestive heart failure with total occlusion of the LAD and viability of the anterior wall based on cardiac MRI delayed enhancement.  Procedural Details: The right wrist was prepped, draped, and anesthetized with 1% lidocaine. Using the modified Seldinger technique, a 6 Fr sheath was introduced into the radial artery. 3 mg verapamil was administered through the radial sheath. Weight-based bivalirudin was given for anticoagulation. The patient had been adequately preloaded with brilinta. Once a therapeutic ACT was achieved, a 6 [place] XB LAD 3.5 cm guide catheter was inserted. I initially tried to cross the occlusion with a 300 cm cougar wire. A 2.0 mm sprinter balloon was used for backup support but this was unsuccessful. I then changed out to a [employer] 3 g wire with the same balloon for backup support. I was able to cross the proximal occlusion but could not pass a wire very far down the LAD. At that point I decided the contralateral imaging from the right coronary artery would be beneficial to identify the collaterals to the LAD. The right groin had been prepped and draped. A 6 French sheath was introduced without difficulty. A JR 4 guide catheter was introduced and a second manifold was set up. The right coronary artery was injected and there was a faint collateral to the distal LAD again identified. At that point, I changed out to a 6 g [employer] wire and after a prolonged attempt I was able to cross into the true lumen of the distal LAD. However, the sprinter balloon would not advance across the occlusion. I changed out to a 1.25 mm sprinter balloon and this also would not cross. I did 2 inflations in the proximal nose of the occlusion but this did not allow passage of the balloon. I changed out to a 1.5 x 10 mm apex push  balloon and this still would not cross. At that point, I introduced a guide line or catheter into the nose of the occlusion to provide backup support. I was able to easily cross the occlusion with the 1.5 mm balloon and multiple inflations were done throughout the mid LAD. The 2.0 mm sprinter balloon was readvanced and multiple inflations were again done. Angiography demonstrated flow into the distal LAD but the vessel was extremely small and the flow was minimal. Intracoronary nitroglycerin was administered. I did several balloon inflations in the mid and distal LAD with a 2.0 mm balloon and this improved flow into the distal vessel. However, there were several areas of the wound dissection. The vessel was too small the stent in that region of the mid and distal LAD. I decided to focally stent the LAD at the site of the previous occlusion. A 2.25 x 23 mm Xience drug-eluting stent was used. The stent was deployed at 10 atmospheres with a good result. I then passed a 2 mm balloon into the mid LAD and a 2 minute inflation to try to tack up the dissection plane. The stent was then postdilated with a 2.5 mm noncompliant balloon to 16 atmospheres. Final angiography confirmed an adequate result with TIMI 3 flow into the apical portion of the LAD. The LAD was severely diseased distally with several areas of non-flow-limiting dissection. I felt this was the best result we could achieve and the procedure was prolonged. Total fluoroscopy time was 38.1 minutes there was an exposure  of 6067 MGy. The patient tolerated the procedure well. There were no immediate procedural complications. A TR band was used for radial hemostasis. The patient was transferred to the post catheterization recovery area for further monitoring.  Lesion Data: Vessel:  LAD/min Percent stenosis (pre): 100 (CTO) TIMI-flow (pre):  0 Stent:  2.25 x 23 mm drug-eluting Percent stenosis (post): 0 TIMI-flow (post): 3  Conclusions:  Successful complex  PCI of a chronic total occlusion in the mid LAD.  Recommendations: Dual antiplatelet therapy with aspirin and brilinta for a minimum of 12 months. Consideration of PCI of the diagonal branch in approximately 4 weeks depending on the patient's clinical course.  Sherren Mocha 07/23/2012, 2:03 PM

## 2012-07-23 NOTE — Progress Notes (Signed)
FMTS Attending NOte Patient seen and examined by me today, I have discussed with resident team and agree with their assessment and plan. Please see my separate progress note for further details.  Dalbert Mayotte, MD

## 2012-07-23 NOTE — H&P (View-Only) (Signed)
Discussed patient's case with Dr Aundra Dubin who interpreted cardiac MRI. The patient has viability of the anteroapex based on less than 50% delayed enhancement. I discussed her case with Dr Cyndia Bent of cardiac surgery who reviewed her films. We agree that PCI is the preferred revascularization strategy considering the LAD target beyond the total occlusion is poorly visualized and may not be a reasonable surgical target. The patient also has multiple significant comorbidities with an LVEF of 20%. I have reviewed the risks, indication, and alternatives to PCI of the LAD with the patient and her family. Specifically we discussed the lower success rate of CTO PCI and higher potential complication rate. Considering her viable myocardium and severe LV dysfunction, these increased risks are justified. She agrees to proceed. I will load her with Brilinta tonight.  Sherren Mocha 07/22/2012 6:53 PM

## 2012-07-23 NOTE — Progress Notes (Signed)
74fr sheath aspirated and removed from rfa, manual pressure applied for 20 minutes. BP 106/60 hr 85 spo2 98%. No s+s of hematoma, groin level 0. Tegaderm dressing applied/ bedrest instructions given. Distal pulses dp palpaple.

## 2012-07-24 DIAGNOSIS — E669 Obesity, unspecified: Secondary | ICD-10-CM

## 2012-07-24 DIAGNOSIS — E119 Type 2 diabetes mellitus without complications: Secondary | ICD-10-CM

## 2012-07-24 DIAGNOSIS — I2510 Atherosclerotic heart disease of native coronary artery without angina pectoris: Secondary | ICD-10-CM

## 2012-07-24 LAB — GLUCOSE, CAPILLARY
Glucose-Capillary: 255 mg/dL — ABNORMAL HIGH (ref 70–99)
Glucose-Capillary: 154 mg/dL — ABNORMAL HIGH (ref 70–99)

## 2012-07-24 LAB — CBC
MCH: 27.6 pg (ref 26.0–34.0)
MCHC: 32.3 g/dL (ref 30.0–36.0)
MCV: 85.5 fL (ref 78.0–100.0)
Platelets: 195 10*3/uL (ref 150–400)
RDW: 13.7 % (ref 11.5–15.5)

## 2012-07-24 LAB — BASIC METABOLIC PANEL
BUN: 21 mg/dL (ref 6–23)
CO2: 24 mEq/L (ref 19–32)
Calcium: 8.6 mg/dL (ref 8.4–10.5)
Creatinine, Ser: 1.07 mg/dL (ref 0.50–1.10)
GFR calc non Af Amer: 56 mL/min — ABNORMAL LOW (ref >90)
Glucose, Bld: 192 mg/dL — ABNORMAL HIGH (ref 70–99)
Sodium: 139 mEq/L (ref 135–145)

## 2012-07-24 MED ORDER — SPIRONOLACTONE 25 MG PO TABS
25.0000 mg | ORAL_TABLET | Freq: Every day | ORAL | Status: DC
  Administered 2012-07-25 – 2012-07-26 (×2): 25 mg via ORAL
  Filled 2012-07-24 (×2): qty 1

## 2012-07-24 MED ORDER — FUROSEMIDE 10 MG/ML IJ SOLN
40.0000 mg | Freq: Once | INTRAMUSCULAR | Status: AC
  Administered 2012-07-24: 40 mg via INTRAVENOUS
  Filled 2012-07-24: qty 4

## 2012-07-24 MED ORDER — ENOXAPARIN SODIUM 80 MG/0.8ML ~~LOC~~ SOLN
65.0000 mg | SUBCUTANEOUS | Status: DC
  Administered 2012-07-25: 65 mg via SUBCUTANEOUS
  Filled 2012-07-24 (×3): qty 0.8

## 2012-07-24 MED ORDER — FUROSEMIDE 40 MG PO TABS
40.0000 mg | ORAL_TABLET | Freq: Every day | ORAL | Status: DC
  Administered 2012-07-25 – 2012-07-26 (×2): 40 mg via ORAL
  Filled 2012-07-24 (×2): qty 1

## 2012-07-24 MED ORDER — FUROSEMIDE 40 MG PO TABS: 40.0000 mg | ORAL_TABLET | Freq: Two times a day (BID) | ORAL | Status: DC

## 2012-07-24 MED FILL — Dextrose Inj 5%: INTRAVENOUS | Qty: 50 | Status: AC

## 2012-07-24 NOTE — Progress Notes (Signed)
CARDIAC REHAB PHASE I   PRE:  Rate/Rhythm: 84SR  BP:  Supine: 100/55  Sitting:   Standing:    SaO2: 91%2L, 89%RA  MODE:  Ambulation: 340 ft   POST:  Rate/Rhythem: 96SR  BP:  Supine:   Sitting: 112/64  Standing:    SaO2: 91-95%RA during walk. [id number] Pt walked 340 ft on Ra with rolling walker and minimal asst. Pt has walker at home for use. Sats got better with walk. Monitored sats whole walk and got to 95% on RA. Pt stated she felt a little winded toward end of walk but this was first time pt had walked outside of room. Denied CP. Education completed on daily weights, watching sodium and MI. Permission given to refer to [facility] Phase 2. Discussed importance of letting cardiologist know of any reoccurring CP. Reviewed zones from CHF packet and when to notify MD. Pt sitting on side of bed after walk.   Jeani Sow

## 2012-07-24 NOTE — Progress Notes (Signed)
FMTS Attending Note Patient seen and examined by me, discussed with resident team and I agree with their plan.  Patient denies chest pain; she states that she is breathing more comfortably.   Plan for modest diuresis today, with possibility of discharge to home tomorrow as per Cardiology service.  Dalbert Mayotte, MD

## 2012-07-24 NOTE — Progress Notes (Signed)
PGY-1 Daily Progress Note Family Medicine Teaching Service [place] R. Cayleigh Paull, DO Service Pager: [phone]   Subjective: Pt had a successful cath yesterday.  No concerns at this time.   Objective:  VITALS Temp:  [97.8 F (36.6 C)-98.6 F (37 C)] 97.8 F (36.6 C) (10/18 0753) Pulse Rate:  [83-93] 89  (10/18 0753) Resp:  [12-25] 25  (10/18 0753) BP: (92-116)/(50-66) 116/66 mmHg (10/18 0753) SpO2:  [94 %-99 %] 94 % (10/18 0753) Weight:  [284 lb 6.3 oz (129 kg)] 284 lb 6.3 oz (129 kg) (10/18 0500)  In/Out  Intake/Output Summary (Last 24 hours) at 07/24/12 1226 Last data filed at 07/24/12 0900  Gross per 24 hour  Intake   1200 ml  Output   1450 ml  Net   -250 ml    Physical Exam: General: NAD  Neck: No JVD, no thyromegaly or thyroid nodule.  Lungs: Slight crackles at bases bilaterally.  CV: Nondisplaced PMI. Heart regular S1/S2, +S3 no S4. 1+ ankle edema. No carotid bruit. Normal pedal pulses.  Abdomen: Soft, nontender, no hepatosplenomegaly, no distention.  Neurologic: Alert and oriented x 3.  Psych: Normal affect.  Extremities: No clubbing or cyanosis. Right groin cath site benign.  MEDS Scheduled Meds:    . antiseptic oral rinse  15 mL Mouth Rinse BID  . aspirin EC  81 mg Oral Daily  . bivalirudin      . bivalirudin (ANGIOMAX) infusion 5 mg/mL (Cath Lab,ACS,PCI indication)  0.25 mg/kg/hr Intravenous To Cath  . carvedilol  3.125 mg Oral BID WC  . diazepam  5 mg Oral On Call  . digoxin  0.125 mg Oral Daily  . enoxaparin  40 mg Subcutaneous Q24H  . fentaNYL      . furosemide  40 mg Oral Daily  . gabapentin  600 mg Oral TID  . heparin      . insulin aspart  0-20 Units Subcutaneous TID WC  . insulin aspart  0-5 Units Subcutaneous QHS  . lidocaine      . lisinopril  5 mg Oral BID  . midazolam      . midazolam      . midazolam      . nitroGLYCERIN  0.5 inch Topical Q6H  . nitroGLYCERIN      . nortriptyline  50 mg Oral QHS  . simvastatin  20 mg Oral q1800  . sodium  chloride  3 mL Intravenous Q12H  . sodium chloride  3 mL Intravenous Q12H  . spironolactone  12.5 mg Oral Daily  . Ticagrelor  90 mg Oral BID  . verapamil      . DISCONTD: bivalirudin (ANGIOMAX) infusion 0.5 mg/mL (Non-ACS indications)  0.1 mg/kg/hr Intravenous To Cath   Continuous Infusions:    . sodium chloride 50 mL/hr at 07/23/12 0630  . sodium chloride 50 mL/hr at 07/23/12 1530   PRN Meds:.sodium chloride, sodium chloride, acetaminophen, cyclobenzaprine, nitroGLYCERIN, ondansetron (ZOFRAN) IV, oxyCODONE-acetaminophen, sodium chloride, sodium chloride, traMADol, DISCONTD: acetaminophen, DISCONTD: ondansetron (ZOFRAN) IV  Labs and imaging:   CBC  Lab 07/24/12 0445 07/23/12 0440 07/22/12 0515  WBC 7.7 8.4 7.9  HGB 11.2* 11.3* 11.0*  HCT 34.7* 33.9* 33.9*  PLT 195 213 227   BMET/CMET  Lab 07/24/12 0445 07/23/12 0440 07/22/12 0515 07/20/12 1854  NA 139 140 137 --  K 4.1 3.7 3.6 --  CL 106 104 102 --  CO2 24 24 25  --  BUN 21 23 20  --  CREATININE 1.07 1.11* 1.14* --  CALCIUM  8.6 8.4 8.4 --  PROT -- -- -- 7.0  BILITOT -- -- -- 0.3  ALKPHOS -- -- -- 96  ALT -- -- -- 8  AST -- -- -- 12  GLUCOSE 192* 189* 181* --    Dg Chest 2 View  07/21/2012   IMPRESSION: Bilateral pleural effusions with basilar atelectasis or infiltration.   Original Report Authenticated By: Neale Burly, M.D.    Dg Chest 2 View   IMPRESSION: Cardiomegaly with diffuse airspace disease versus edema and effusions.   Original Report Authenticated By: Jerilynn Mages. Daryll Brod, M.D.    Ct Angio Chest W/cm &/or Wo Cm  07/21/2012    IMPRESSION: No evidence of significant pulmonary embolus.  Moderate sized bilateral pleural effusions with bilateral basilar atelectasis or infiltration.   Original Report Authenticated By: Neale Burly, M.D.    ECHO 07/21/2012  Study Conclusions  - Left ventricle: The cavity size was mildly dilated. Wall thickness was normal. Systolic function was severely reduced.  The estimated ejection fraction was in the range of 20% to 25%. The entire mid to distal 2/3 of the ventricle is severely hypokinetic/akinetic. - Mitral valve: Mild regurgitation. - Pericardium, extracardiac: A small, free-flowing pericardial effusion was identified along the right atrial free wall.  Cardia Cath 07/21/12   Left main: Long. Mild plaque  LAD: Completely occluded proximally after first diagonal. 1st diagonal 30-40% ostial. Diffuse diabetic vasculopathy long 95-99% in midsection  LCX: Small vessel (1.87mm) with severe diffuse diabetic vasculopathy. Ostial 80%  RAMUS: Moderate sized vessel. Diffuse 30%  RCA: Very large dominant vessel. Diffuse 40% in midsection.. 30% around distal bend. PDA moderate sized vessel subtotally occluded. 2 large PLs with mild plaque. Very fain collateral to LAD.  LV-gram done in the RAO projection: Ejection fraction = 15% Distal 2/3 of ventricle is akinetic  Assessment:  1. Severe 3v CAD with totally occluded LAD  2. Severe LV dysfunction  3. Mildly elevated filling pressures with preserved cardiac output   Cardiac MRI 07/22/12: IMPRESSION:  1. Normal LV size with severe systolic dysfunction, EF 123456. There  were wall motion abnormalities as described above.  2. RV normal in size and systolic function.  3. On delayed enhancement imaging, there was 26-50% wall thickness  subendocardial enhancement in the mid to apical inferoseptal,  anteroseptal, and anterior walls. This suggests that the LV  segments with wall motion abnormalities may improve in function  with revascularization (suggests viability).  Cardiac Catheterization 07/23/12 Conclusions:  Successful complex PCI of a chronic total occlusion in the mid LAD.  Recommendations: Dual antiplatelet therapy with aspirin and brilinta for a minimum of 12 months. Consideration of PCI of the diagonal branch in approximately 4 weeks depending on the patient's clinical course.   Assessment/Plan:    [name] is a 56 y.o. year old female with history of DM type 2, HLD presenting with 2 weeks worsening exertional dyspnea found to have new onset acute congestive heart failure.   1. Acute decompensated CHF NYHA Stage III, EF 15% - Heart failure team consulted yesterday due to worsening SOB, pitting edema.  Echo was done showing 20-25% EF with LV severely hypokinetic, 2/3 lower aspectic akinetic.   1) Pt taken to cath on 10/15 showing severe three vessel disease with completely occluded LAD.  Akinetic LV and EF of 15%  2) MRI perfromed on 10/16 showed possible viable tissue with revascularization. Successful PCI of LAD on 10/17.    3) Will need aggressive HF tx in outpt  setting and possible LifeVest according to Dr. Haroldine Laws  4) Continue to follow daily weights, I/O.  Switch to Lasix 40 mg PO.  Weight up to 284 today.    2.Recent STEMI with new onset LBBB / CAD- Recent changes in EKG with elevation in troponin's.  1) Pt taken to cath 10/15 showing complete occlusion of LAD with diabetic vasculopathy.  Repeat Cath 07/23/12 stenting the LAD.  Plan is to place stent in 4 weeks to the LCx and keep on Brilinta for 12 months and ASA life long  2) Most likely had a recent silent MI within the past several weeks   3) Continue ASA, start low dose BB, increase ACE and add Aldactone.  Will increase Statin dose. Nitro PRN. Added on Digoxin 0.125 mg qd.   3. Diabetes mellitus type 2. Last A1c 7.7. However, previously severely uncontrolled in previous years according to Dr. Nori Riis.   1)Sliding scale moderate without qhs coverage. CBG monitoring   2) Holding Metformin due to cath.  Will consider restarting 48 hrs after.  Consider adding back on Glucotrol 5 mg  4) Hyperlipidemia - LDL of 58 but complete occlusion of LAD. Will continue on statin  4. Neuropathic pain Continue neurontin and flexeril prn at home dose.  Also continue Pamelor 50 mg qd.   FEN/GI: heart healthy diet. zofran prn. Saline lock.   Prophylaxis: Lovenox 40 mg SQ qd Disposition: Patient and husband informed of treatment plan.  Will follow cardiology recommendations Code Status: DNR per living will and talk with pt.   Tamela Oddi Awanda Mink, DO of [facility] 07/24/2012, 8:09 AM

## 2012-07-25 DIAGNOSIS — I2510 Atherosclerotic heart disease of native coronary artery without angina pectoris: Secondary | ICD-10-CM

## 2012-07-25 DIAGNOSIS — I447 Left bundle-branch block, unspecified: Secondary | ICD-10-CM

## 2012-07-25 LAB — CBC
MCH: 27.7 pg (ref 26.0–34.0)
MCHC: 32.9 g/dL (ref 30.0–36.0)
MCV: 84 fL (ref 78.0–100.0)
Platelets: 176 10*3/uL (ref 150–400)

## 2012-07-25 LAB — GLUCOSE, CAPILLARY: Glucose-Capillary: 185 mg/dL — ABNORMAL HIGH (ref 70–99)

## 2012-07-25 LAB — BASIC METABOLIC PANEL
BUN: 24 mg/dL — ABNORMAL HIGH (ref 6–23)
CO2: 24 mEq/L (ref 19–32)
Calcium: 8.6 mg/dL (ref 8.4–10.5)
GFR calc non Af Amer: 51 mL/min — ABNORMAL LOW (ref >90)
Glucose, Bld: 326 mg/dL — ABNORMAL HIGH (ref 70–99)
Potassium: 3.9 mEq/L (ref 3.5–5.1)

## 2012-07-25 MED ORDER — FUROSEMIDE 10 MG/ML IJ SOLN
80.0000 mg | Freq: Once | INTRAMUSCULAR | Status: AC
  Administered 2012-07-25: 80 mg via INTRAVENOUS
  Filled 2012-07-25: qty 8

## 2012-07-25 NOTE — Progress Notes (Signed)
SUBJECTIVE: The patient is doing well today.  At this time, she denies chest pain, shortness of breath, or any new concerns.  She wants to go home.     Marland Kitchen antiseptic oral rinse  15 mL Mouth Rinse BID  . aspirin EC  81 mg Oral Daily  . carvedilol  3.125 mg Oral BID WC  . digoxin  0.125 mg Oral Daily  . enoxaparin (LOVENOX) injection  65 mg Subcutaneous Q24H  . furosemide  40 mg Intravenous Once  . furosemide  80 mg Intravenous Once  . furosemide  40 mg Oral Daily  . gabapentin  600 mg Oral TID  . insulin aspart  0-20 Units Subcutaneous TID WC  . insulin aspart  0-5 Units Subcutaneous QHS  . lisinopril  5 mg Oral BID  . nitroGLYCERIN  0.5 inch Topical Q6H  . nortriptyline  50 mg Oral QHS  . simvastatin  20 mg Oral q1800  . sodium chloride  3 mL Intravenous Q12H  . sodium chloride  3 mL Intravenous Q12H  . spironolactone  25 mg Oral Daily  . Ticagrelor  90 mg Oral BID  . DISCONTD: bivalirudin (ANGIOMAX) infusion 5 mg/mL (Cath Lab,ACS,PCI indication)  0.25 mg/kg/hr Intravenous To Cath  . DISCONTD: spironolactone  12.5 mg Oral Daily      . sodium chloride 50 mL/hr at 07/23/12 0630    OBJECTIVE: Physical Exam: Filed Vitals:   07/25/12 0758 07/25/12 0800 07/25/12 0857 07/25/12 0901  BP:  109/59 109/59   Pulse:   85 85  Temp: 97.6 F (36.4 C)     TempSrc: Oral     Resp: 16     Height:      Weight:      SpO2: 94%       Intake/Output Summary (Last 24 hours) at 07/25/12 1048 Last data filed at 07/25/12 0902  Gross per 24 hour  Intake   1146 ml  Output   1000 ml  Net    146 ml    Telemetry reveals sinus rhythm with left BBB, no significant ectopy  GEN- The patient is well appearing, alert and oriented x 3 today.   Head- normocephalic, atraumatic Eyes-  Sclera clear, conjunctiva pink Ears- hearing intact Oropharynx- clear Neck- supple, JVP 10cm Lymph- no cervical lymphadenopathy Lungs- few basilar rales, normal work of breathing Heart- Regular rate and rhythm,  no murmurs, rubs or gallops, PMI not laterally displaced GI- soft, NT, ND, + BS Extremities- no clubbing, cyanosis,  Edema improved Skin- no rash or lesion Psych- euthymic mood, full affect Neuro- strength and sensation are intact  LABS: Basic Metabolic Panel:  Basename 07/25/12 0947 07/24/12 0445  NA 135 139  K 3.9 4.1  CL 100 106  CO2 24 24  GLUCOSE 326* 192*  BUN 24* 21  CREATININE 1.15* 1.07  CALCIUM 8.6 8.6  MG -- --  PHOS -- --   Liver Function Tests: No results found for this basename: AST:2,ALT:2,ALKPHOS:2,BILITOT:2,PROT:2,ALBUMIN:2 in the last 72 hours No results found for this basename: LIPASE:2,AMYLASE:2 in the last 72 hours CBC:  Basename 07/25/12 0645 07/24/12 0445  WBC 6.7 7.7  NEUTROABS -- --  HGB 10.9* 11.2*  HCT 33.1* 34.7*  MCV 84.0 85.5  PLT 176 195    RADIOLOGY: Dg Chest 2 View  07/21/2012  *RADIOLOGY REPORT*  Clinical Data: Cough and shortness of breath.  CHEST - 2 VIEW  Comparison: None.  Findings: Shallow inspiration.  Borderline heart size with normal pulmonary vascularity.  Bilateral pleural effusions with basilar atelectasis or infiltration in the bases of the lungs.  No pneumothorax.  Mediastinal contours appear intact.  Degenerative changes in the spine.  IMPRESSION: Bilateral pleural effusions with basilar atelectasis or infiltration.   Original Report Authenticated By: Neale Burly, M.D.    Dg Chest 2 View  07/20/2012  *RADIOLOGY REPORT*  Clinical Data: Shortness of breath, diabetes, hypertension  CHEST - 2 VIEW  Comparison: 05/17/2010  Findings: Moderate pleural effusions with mid and lower lung diffuse airspace disease versus edema.  Heart is enlarged.  CHF is favored over pneumonia.  No pneumothorax.  Trachea is midline.  IMPRESSION: Cardiomegaly with diffuse airspace disease versus edema and effusions.   Original Report Authenticated By: Jerilynn Mages. Daryll Brod, M.D.    Ct Angio Chest W/cm &/or Wo Cm  07/21/2012  *RADIOLOGY REPORT*   Clinical Data: Shortness of breath for 1 week.  Lower extremity edema.  CT ANGIOGRAPHY CHEST  Technique:  Multidetector CT imaging of the chest using the standard protocol during bolus administration of intravenous contrast. Multiplanar reconstructed images including MIPs were obtained and reviewed to evaluate the vascular anatomy.  Contrast: 181mL OMNIPAQUE IOHEXOL 350 MG/ML SOLN  Comparison: None.  Findings: Technically adequate study with moderately good opacification of the central and segmental pulmonary arteries.  No focal filling defects are demonstrated.  No evidence of significant pulmonary embolus.  Normal heart size.  Normal caliber thoracic aorta.  No significant lymphadenopathy in the chest.  Moderate sized bilateral pleural effusions with basilar atelectasis or infiltration bilaterally.  Respiratory motion artifact limits visualization of the lung fields.  No pneumothorax.  Airways appear patent.  Degenerative changes in the thoracic spine.  Visualized portions of the upper abdominal organs are grossly unremarkable.  IMPRESSION: No evidence of significant pulmonary embolus.  Moderate sized bilateral pleural effusions with bilateral basilar atelectasis or infiltration.   Original Report Authenticated By: Neale Burly, M.D.    Mr Card Morphology Wo/w Cm  07/22/2012  *RADIOLOGY REPORT*  Clinical Data: CAD, evaluate for viability.  MR CARDIA MORPHOLOGY WITHOUT AND WITH CONTRAST  GE 1.5 T magnet with dedicated cardiac coil.  FIESTA sequences for function and morphology.  10 minutes after 30 mL Multihance contrast was injected, inversion recovery sequences were done to assess for myocardial delayed enhancement.  EF was calculated at a dedicated workstation.  Contrast: 80mL MULTIHANCE GADOBENATE DIMEGLUMINE 529 MG/ML IV SOLN  Comparison: None.  Findings: Technically difficult study due to artifact from inadequate breath-holding.  There were bilateral R>L moderate pleural effusions.  There was a  trivial pericardial effusion.  The left ventricle was normal in size and wall thickness with severe systolic dysfunction, EF 123456. There was septal-lateral dyssynchrony.  The inferoseptal and anteroseptal walls were akinetic.  The apical lateral wall was severely hypokinetic.  The mid to apical inferior wall segments were hypokinetic.  The mid to apical anterior wall was moderate to severely hypokinetic.  No LV thrombus was noted.  The right ventricle was normal in size and systolic function.  The right atrium was normal in size.  There was mild left atrial enlargement. Images were suboptimal due to difficulty with breath holds, so I am unable to comment on valvular regurgitation.  On delayed enhancement imaging, there was 26-50% wall thickness subendocardial delayed enhancement in the mid to apical inferoseptal, anteroseptal, and anterior wall segments.  Measurements:  LVEDV 245 mL  LV SV 50 mL  LV EF 20%  IMPRESSION: 1. Normal LV size with severe systolic dysfunction,  EF 20%.  There were wall motion abnormalities as described above.  2. RV normal in size and systolic function.  3. On delayed enhancement imaging, there was 26-50% wall thickness subendocardial enhancement in the mid to apical inferoseptal, anteroseptal, and anterior walls.  This suggests that the LV segments with wall motion abnormalities may improve in function with revascularization (suggests viability).   Original Report Authenticated By: [id number]     ASSESSMENT AND PLAN:  Principal Problem:  *Acute CHF (congestive heart failure) Active Problems:  Diabetes mellitus type 2 in obese  Hypoxia  HLD (hyperlipidemia)  Acute systolic heart failure  Acute myocardial infarction, subendocardial infarction, initial episode of care  Assessment:   1. Acute systolic heart failure  2. ICM, EF 20-25%  3. Out of hospital anterior MI  4. CAD s/p complex PTCA and stenting of LAD (chronic total occlusion)  5. DM  6. Obesity  7. HL  8. LBBB    Plan/Discussion:    Volume status mildly elevated.  Weight is stable.  Per nursing, Is/Os are inaccurate Agree with 1 more day of diuresis with discharge to home tomorrow. Continue current medical regimen with follow-up with Dr Haroldine Laws next week. She will require ASA and brilinta for at least a year per Dr Burt Knack.  Dr Haroldine Laws and I have spoken this am.  I do not feel that LifeVest is necessary at discharge and he agrees.  The CHF clinic will follow her closely with medicine optimization as an outpatient.  Per Dr. Burt Knack, may consider PCI of diag in ~4 weeks depending on clinical course.   Will repeat echo 90 days following final revascularization (anticipating possible PCI of dx in 4 weeks, this would mean repeat echo in about 4 months) and strongly consider BiV ICD at that time if EF is <35%.  Outpatient cardiac rehab Transfer to telemetry today with probable discharge in am.  Thompson Grayer, MD 07/25/2012 10:48 AM

## 2012-07-25 NOTE — Progress Notes (Signed)
PGY-1 Daily Progress Note Family Medicine Teaching Service [place] R. Embrie Mikkelsen, DO Service Pager: [phone]   Subjective: Pt is doing well.  No current CP, SOB, DOE, abdominal pain.  Ready to go home and sleep in her own bed.   Objective:  VITALS Temp:  [97.4 F (36.3 C)-98.3 F (36.8 C)] 97.6 F (36.4 C) (10/19 0758) Pulse Rate:  [85-91] 85  (10/19 0901) Resp:  [16-20] 16  (10/19 0758) BP: (95-119)/(54-70) 109/59 mmHg (10/19 0857) SpO2:  [93 %-96 %] 94 % (10/19 0758) Weight:  [280 lb 10.3 oz (127.3 kg)] 280 lb 10.3 oz (127.3 kg) (10/19 0500)  In/Out  Intake/Output Summary (Last 24 hours) at 07/25/12 1005 Last data filed at 07/25/12 0902  Gross per 24 hour  Intake   1146 ml  Output   1000 ml  Net    146 ml   UOP - 0.5 ml/kg/hr  Physical Exam: General: NAD  Neck: No JVD, no thyromegaly or thyroid nodule.  Lungs: Slight crackles at bases bilaterally.  CV: Nondisplaced PMI. Heart regular S1/S2, +S3 no S4. 1+ ankle edema. No carotid bruit. Normal pedal pulses.  Abdomen: Soft, nontender, no hepatosplenomegaly, no distention.  Neurologic: Alert and oriented x 3.  Psych: Normal affect.  Extremities: No clubbing or cyanosis. Right groin cath site benign.  MEDS Scheduled Meds:    . antiseptic oral rinse  15 mL Mouth Rinse BID  . aspirin EC  81 mg Oral Daily  . carvedilol  3.125 mg Oral BID WC  . digoxin  0.125 mg Oral Daily  . enoxaparin (LOVENOX) injection  65 mg Subcutaneous Q24H  . furosemide  40 mg Intravenous Once  . furosemide  40 mg Oral Daily  . gabapentin  600 mg Oral TID  . insulin aspart  0-20 Units Subcutaneous TID WC  . insulin aspart  0-5 Units Subcutaneous QHS  . lisinopril  5 mg Oral BID  . nitroGLYCERIN  0.5 inch Topical Q6H  . nortriptyline  50 mg Oral QHS  . simvastatin  20 mg Oral q1800  . sodium chloride  3 mL Intravenous Q12H  . sodium chloride  3 mL Intravenous Q12H  . spironolactone  25 mg Oral Daily  . Ticagrelor  90 mg Oral BID  . DISCONTD:  bivalirudin (ANGIOMAX) infusion 5 mg/mL (Cath Lab,ACS,PCI indication)  0.25 mg/kg/hr Intravenous To Cath  . DISCONTD: enoxaparin  40 mg Subcutaneous Q24H  . DISCONTD: furosemide  40 mg Oral Daily  . DISCONTD: furosemide  40 mg Oral BID  . DISCONTD: spironolactone  12.5 mg Oral Daily   Continuous Infusions:    . sodium chloride 50 mL/hr at 07/23/12 0630   PRN Meds:.sodium chloride, sodium chloride, acetaminophen, cyclobenzaprine, nitroGLYCERIN, ondansetron (ZOFRAN) IV, oxyCODONE-acetaminophen, sodium chloride, sodium chloride, traMADol  Labs and imaging:   CBC  Lab 07/25/12 0645 07/24/12 0445 07/23/12 0440  WBC 6.7 7.7 8.4  HGB 10.9* 11.2* 11.3*  HCT 33.1* 34.7* 33.9*  PLT 176 195 213   BMET/CMET  Lab 07/24/12 0445 07/23/12 0440 07/22/12 0515 07/20/12 1854  NA 139 140 137 --  K 4.1 3.7 3.6 --  CL 106 104 102 --  CO2 24 24 25  --  BUN 21 23 20  --  CREATININE 1.07 1.11* 1.14* --  CALCIUM 8.6 8.4 8.4 --  PROT -- -- -- 7.0  BILITOT -- -- -- 0.3  ALKPHOS -- -- -- 96  ALT -- -- -- 8  AST -- -- -- 12  GLUCOSE 192* 189* 181* --  Dg Chest 2 View  07/21/2012   IMPRESSION: Bilateral pleural effusions with basilar atelectasis or infiltration.   Original Report Authenticated By: Neale Burly, M.D.    Dg Chest 2 View   IMPRESSION: Cardiomegaly with diffuse airspace disease versus edema and effusions.   Original Report Authenticated By: Jerilynn Mages. Daryll Brod, M.D.    Ct Angio Chest W/cm &/or Wo Cm  07/21/2012    IMPRESSION: No evidence of significant pulmonary embolus.  Moderate sized bilateral pleural effusions with bilateral basilar atelectasis or infiltration.   Original Report Authenticated By: Neale Burly, M.D.    ECHO 07/21/2012  Study Conclusions  - Left ventricle: The cavity size was mildly dilated. Wall thickness was normal. Systolic function was severely reduced. The estimated ejection fraction was in the range of 20% to 25%. The entire mid to distal 2/3  of the ventricle is severely hypokinetic/akinetic. - Mitral valve: Mild regurgitation. - Pericardium, extracardiac: A small, free-flowing pericardial effusion was identified along the right atrial free wall.  Cardia Cath 07/21/12   Left main: Long. Mild plaque  LAD: Completely occluded proximally after first diagonal. 1st diagonal 30-40% ostial. Diffuse diabetic vasculopathy long 95-99% in midsection  LCX: Small vessel (1.89mm) with severe diffuse diabetic vasculopathy. Ostial 80%  RAMUS: Moderate sized vessel. Diffuse 30%  RCA: Very large dominant vessel. Diffuse 40% in midsection.. 30% around distal bend. PDA moderate sized vessel subtotally occluded. 2 large PLs with mild plaque. Very fain collateral to LAD.  LV-gram done in the RAO projection: Ejection fraction = 15% Distal 2/3 of ventricle is akinetic  Assessment:  1. Severe 3v CAD with totally occluded LAD  2. Severe LV dysfunction  3. Mildly elevated filling pressures with preserved cardiac output   Cardiac MRI 07/22/12: IMPRESSION:  1. Normal LV size with severe systolic dysfunction, EF 123456. There  were wall motion abnormalities as described above.  2. RV normal in size and systolic function.  3. On delayed enhancement imaging, there was 26-50% wall thickness  subendocardial enhancement in the mid to apical inferoseptal,  anteroseptal, and anterior walls. This suggests that the LV  segments with wall motion abnormalities may improve in function  with revascularization (suggests viability).  Cardiac Catheterization 07/23/12 Conclusions:  Successful complex PCI of a chronic total occlusion in the mid LAD.  Recommendations: Dual antiplatelet therapy with aspirin and brilinta for a minimum of 12 months. Consideration of PCI of the diagonal branch in approximately 4 weeks depending on the patient's clinical course.   Assessment/Plan:  [name] is a 56 y.o. year old female with history of DM type 2, HLD presenting  with 2 weeks worsening exertional dyspnea found to have new onset acute congestive heart failure.   1. Acute decompensated CHF NYHA Stage III, EF 15% - Heart failure team consulted yesterday due to worsening SOB, pitting edema.  Echo was done showing 20-25% EF with LV severely hypokinetic, 2/3 lower aspectic akinetic.   1) Pt taken to cath on 10/15 showing severe three vessel disease with completely occluded LAD.  Akinetic LV and EF of 15%  2) MRI perfromed on 10/16 showed possible viable tissue with revascularization. Successful PCI of LAD on 10/17.    3) Will need aggressive HF tx in outpt setting and possible LifeVest according to Dr. Haroldine Laws  4) Continue to follow daily weights, I/O.  Switch to Lasix 40 mg PO.  Weight still up at 280 (baseline 278).  Will go ahead and give one dose 80 mg IV Lasix.  UOP only -250 yesterday recorded, however, not all UOP has been recorded per nurse taking care of pt.   2.Recent STEMI with new onset LBBB / CAD- Recent changes in EKG with elevation in troponin's.  1) Pt taken to cath 10/15 showing complete occlusion of LAD with diabetic vasculopathy.  Repeat Cath 07/23/12 stenting the LAD.  Plan is to place stent in 4 weeks to the LCx and keep on Brilinta for 12 months and ASA life long  2) Most likely had a recent silent MI within the past several weeks   3) Continue ASA, start low dose BB, increase ACE and add Aldactone.  Will increase Statin dose. Nitro PRN. Added on Digoxin 0.125 mg qd.   3. Diabetes mellitus type 2. Last A1c 7.7. However, previously severely uncontrolled in previous years according to Dr. Nori Riis.   1)Sliding scale moderate without qhs coverage. CBG monitoring   2) Holding Metformin due to cath.  Will consider restarting upon d/c.  Consider adding back on Glucotrol 5 mg  4) Hyperlipidemia - LDL of 58 but complete occlusion of LAD. Will continue on statin  4. Neuropathic pain Continue neurontin and flexeril prn at home dose.  Also continue  Pamelor 50 mg qd.   FEN/GI: heart healthy diet. zofran prn. Saline lock.  Prophylaxis: Lovenox 40 mg SQ qd Disposition: Patient and husband informed of treatment plan.  Will follow cardiology recommendations Code Status: DNR per living will and talk with pt.   Tamela Oddi Awanda Mink, DO of [facility] 07/25/2012, 9:12 AM

## 2012-07-25 NOTE — Evaluation (Signed)
Physical Therapy Evaluation and D/C Patient Details Name: [name] MRN: [id number] DOB: Dec 26, 1955 Today's Date: 07/25/2012 Time: [id number] PT Time Calculation (min): 25 min  PT Assessment / Plan / Recommendation Clinical Impression  Pt. s/p CHF with PCI x2 with slight decr mobility secondary to being in hospital.  Pt feels she is at her baseline.  Pt ambulates without need for assistance.  Has a RW she can use if needed.  Gave pt a standing home exercise program.  Feel that she can f/u with phase II cardiac rehab when appropriate and do not recommend PT in hospital or at home.  Will sign off.     PT Assessment  Patent does not need any further PT services    Follow Up Recommendations  No PT follow up    Does the patient have the potential to tolerate intense rehabilitation      Barriers to Discharge        Equipment Recommendations  None recommended by PT    Recommendations for Other Services     Frequency      Precautions / Restrictions Precautions Precautions: None Restrictions Weight Bearing Restrictions: No   Pertinent Vitals/Pain VSS, No pain      Mobility  Bed Mobility Bed Mobility: Not assessed Transfers Transfers: Sit to Stand;Stand to Sit Sit to Stand: 6: Modified independent (Device/Increase time);With upper extremity assist;With armrests;From chair/3-in-1 Stand to Sit: 6: Modified independent (Device/Increase time);With upper extremity assist;With armrests;To chair/3-in-1 Ambulation/Gait Ambulation/Gait Assistance: 7: Independent Ambulation Distance (Feet): 350 Feet Assistive device: None Ambulation/Gait Assistance Details: Good and steady gait.  One LOB with moderate challenge in which patient self corrected her balance.  Gait Pattern: Within Functional Limits;Wide base of support Stairs: No Wheelchair Mobility Wheelchair Mobility: No    Shoulder Instructions     Exercises Other Exercises Other Exercises: Standing exercises- hip flexion,  hip extension, hip abduction and marching in place - 3 xday, 10 reps.   PT Goals  N/A  Visit Information  Last PT Received On: 07/25/12 Assistance Needed: +1    Subjective Data  Subjective: "I know I need to try." Patient Stated Goal: To be independent   Prior Functioning  Home Living Lives With: Spouse;Daughter (and grandson) Available Help at Discharge: Family;Available 24 hours/day Type of Home: House Home Access: Stairs to enter [employer] of Steps: 1 Entrance Stairs-Rails: None Home Layout: Two level;Able to live on main level with bedroom/bathroom Bathroom Shower/Tub: Tub/shower unit Bathroom Toilet: Standard (vanity beside) Home Adaptive Equipment: Shower chair with back;Bedside commode/3-in-1;Walker - rolling;Straight cane;Wheelchair - manual Prior Function Level of Independence: Independent Able to Take Stairs?: Yes Driving: Yes Vocation: On disability Communication Communication: No difficulties Dominant Hand: Right    Cognition  Overall Cognitive Status: Appears within functional limits for tasks assessed/performed Arousal/Alertness: Awake/alert Orientation Level: Appears intact for tasks assessed Behavior During Session: [facility] for tasks performed    Extremity/Trunk Assessment Right Lower Extremity Assessment RLE ROM/Strength/Tone: [facility] for tasks assessed RLE Sensation: History of peripheral neuropathy Left Lower Extremity Assessment LLE ROM/Strength/Tone: WFL for tasks assessed LLE Sensation: History of peripheral neuropathy Trunk Assessment Trunk Assessment: Normal   Balance Dynamic Standing Balance Dynamic Standing - Balance Support: No upper extremity supported;During functional activity Dynamic Standing - Level of Assistance: 5: Stand by assistance Dynamic Standing - Balance Activities: Lateral lean/weight shifting;Reaching across midline;Compliant surfaces  End of Session PT - End of Session Equipment Utilized During Treatment: Gait  belt Activity Tolerance: Patient tolerated treatment well Patient left: in chair;with call  bell/phone within reach;with family/visitor present Nurse Communication: Mobility status       INGOLD,Ark Agrusa 07/25/2012, 3:24 PM  [facility] Rehabilitation [phone] (pager)

## 2012-07-25 NOTE — Progress Notes (Signed)
FMTS Attending Note Patient seen and examined by me, I discussed with resident team and agree with Dr Awanda Mink' assess/plan in this morning's note.  Patient denies chest pain or dyspnea.    Assess/Plan: Patient with recent silent MI and subsequent development CHF, now s/p PCI x2.  To follow Cardiology service's plan for one more day of diuresis before planned discharge tomorrow.  Brilinta and ASA for at least 12 months, with Cardiology follow up and possible repeat PCI in 4 weeks.   Dalbert Mayotte, MD

## 2012-07-25 NOTE — Progress Notes (Signed)
CARDIAC REHAB PHASE I   PRE:  Rate/Rhythm: 87 SR  BP:  Supine:   Sitting: 90/70  Standing:    SaO2: 96 RA  MODE:  Ambulation: 350 ft   POST:  Rate/Rhythem: 97 SR  BP:  Supine:   Sitting: 121/70  Standing:    SaO2: 96 RA 11:25 am to 11:45 am  Ambulated with assist x 1.  Pace good , gait steady, she did tire at the end of walk.  Returned to room to chair, call bell within reach.   Ludwig Clarks

## 2012-07-26 DIAGNOSIS — R0609 Other forms of dyspnea: Secondary | ICD-10-CM

## 2012-07-26 DIAGNOSIS — R0989 Other specified symptoms and signs involving the circulatory and respiratory systems: Secondary | ICD-10-CM

## 2012-07-26 LAB — GLUCOSE, CAPILLARY
Glucose-Capillary: 189 mg/dL — ABNORMAL HIGH (ref 70–99)
Glucose-Capillary: 256 mg/dL — ABNORMAL HIGH (ref 70–99)

## 2012-07-26 LAB — CBC
HCT: 33.2 % — ABNORMAL LOW (ref 36.0–46.0)
Hemoglobin: 10.8 g/dL — ABNORMAL LOW (ref 12.0–15.0)
MCH: 27.5 pg (ref 26.0–34.0)
MCHC: 32.5 g/dL (ref 30.0–36.0)
MCV: 84.5 fL (ref 78.0–100.0)
Platelets: 200 K/uL (ref 150–400)
RBC: 3.93 MIL/uL (ref 3.87–5.11)
RDW: 13.4 % (ref 11.5–15.5)
WBC: 7.1 K/uL (ref 4.0–10.5)

## 2012-07-26 MED ORDER — TORSEMIDE 20 MG PO TABS: 40.0000 mg | ORAL_TABLET | Freq: Every day | ORAL | Status: DC

## 2012-07-26 MED ORDER — LISINOPRIL 5 MG PO TABS: 5.0000 mg | ORAL_TABLET | Freq: Two times a day (BID) | ORAL | Status: DC

## 2012-07-26 MED ORDER — POTASSIUM CHLORIDE ER 10 MEQ PO TBCR: 20.0000 meq | EXTENDED_RELEASE_TABLET | Freq: Every day | ORAL | Status: DC

## 2012-07-26 MED ORDER — CARVEDILOL 3.125 MG PO TABS: 3.1250 mg | ORAL_TABLET | Freq: Two times a day (BID) | ORAL | Status: DC

## 2012-07-26 MED ORDER — DIGOXIN 125 MCG PO TABS: 0.1250 mg | ORAL_TABLET | Freq: Every day | ORAL | Status: DC

## 2012-07-26 MED ORDER — NITROGLYCERIN 0.4 MG SL SUBL: 0.4000 mg | SUBLINGUAL_TABLET | SUBLINGUAL | Status: DC | PRN

## 2012-07-26 MED ORDER — TICAGRELOR 90 MG PO TABS: 90.0000 mg | ORAL_TABLET | Freq: Two times a day (BID) | ORAL | Status: DC

## 2012-07-26 MED ORDER — SPIRONOLACTONE 25 MG PO TABS: 25.0000 mg | ORAL_TABLET | Freq: Every day | ORAL | Status: DC

## 2012-07-26 NOTE — Progress Notes (Signed)
PGY-1 Daily Progress Note Family Medicine Teaching Service [place] R. Hess, DO Service Pager: [phone]   Subjective:  Patient has no complaints this am.  Eager to go home.  Denies CP, SOB, LE edema, N/V.   Objective:  VITALS Temp:  [97.5 F (36.4 C)-97.8 F (36.6 C)] 97.5 F (36.4 C) (10/20 0531) Pulse Rate:  [81-85] 81  (10/20 0531) Resp:  [16-18] 18  (10/20 0531) BP: (93-109)/(45-64) 95/45 mmHg (10/20 0531) SpO2:  [94 %-96 %] 96 % (10/20 0531) Weight:  [275 lb 14.4 oz (125.147 kg)-280 lb 10.3 oz (127.3 kg)] 275 lb 14.4 oz (125.147 kg) (10/20 0531) Weight down 5 lbs from yesterday  In/Out  Intake/Output Summary (Last 24 hours) at 07/26/12 0813 Last data filed at 07/26/12 0650  Gross per 24 hour  Intake   1086 ml  Output   3050 ml  Net  -1964 ml    Physical Exam: General: up sitting in chair this am. NAD. Lungs: CTAB. No rales, rhonchi, or wheeze. CV: RRR. No murmurs, rubs, or gallops. Abdomen: Soft, nontender, nondistended. Extremities: Trace lower extremity noted. Neuro: AO x 3. No focal deficits.  MEDS Scheduled Meds:    . antiseptic oral rinse  15 mL Mouth Rinse BID  . aspirin EC  81 mg Oral Daily  . carvedilol  3.125 mg Oral BID WC  . digoxin  0.125 mg Oral Daily  . enoxaparin (LOVENOX) injection  65 mg Subcutaneous Q24H  . furosemide  80 mg Intravenous Once  . furosemide  40 mg Oral Daily  . gabapentin  600 mg Oral TID  . insulin aspart  0-20 Units Subcutaneous TID WC  . insulin aspart  0-5 Units Subcutaneous QHS  . lisinopril  5 mg Oral BID  . nitroGLYCERIN  0.5 inch Topical Q6H  . nortriptyline  50 mg Oral QHS  . simvastatin  20 mg Oral q1800  . sodium chloride  3 mL Intravenous Q12H  . spironolactone  25 mg Oral Daily  . Ticagrelor  90 mg Oral BID  . DISCONTD: sodium chloride  3 mL Intravenous Q12H   Continuous Infusions:    . sodium chloride 50 mL/hr at 07/23/12 0630   PRN Meds:.sodium chloride, acetaminophen, cyclobenzaprine,  nitroGLYCERIN, ondansetron (ZOFRAN) IV, oxyCODONE-acetaminophen, sodium chloride, traMADol, DISCONTD: sodium chloride, DISCONTD: sodium chloride  Labs and imaging:   CBC  Lab 07/26/12 0605 07/25/12 0645 07/24/12 0445  WBC 7.1 6.7 7.7  HGB 10.8* 10.9* 11.2*  HCT 33.2* 33.1* 34.7*  PLT 200 176 195   BMET/CMET  Lab 07/25/12 0947 07/24/12 0445 07/23/12 0440 07/20/12 1854  NA 135 139 140 --  K 3.9 4.1 3.7 --  CL 100 106 104 --  CO2 24 24 24  --  BUN 24* 21 23 --  CREATININE 1.15* 1.07 1.11* --  CALCIUM 8.6 8.6 8.4 --  PROT -- -- -- 7.0  BILITOT -- -- -- 0.3  ALKPHOS -- -- -- 96  ALT -- -- -- 8  AST -- -- -- 12  GLUCOSE 326* 192* 189* --    Dg Chest 2 View  07/21/2012   IMPRESSION: Bilateral pleural effusions with basilar atelectasis or infiltration.   Original Report Authenticated By: Neale Burly, M.D.    Dg Chest 2 View   IMPRESSION: Cardiomegaly with diffuse airspace disease versus edema and effusions.   Original Report Authenticated By: Jerilynn Mages. Daryll Brod, M.D.    Ct Angio Chest W/cm &/or Wo Cm  07/21/2012    IMPRESSION: No evidence of  significant pulmonary embolus.  Moderate sized bilateral pleural effusions with bilateral basilar atelectasis or infiltration.   Original Report Authenticated By: Neale Burly, M.D.    ECHO 07/21/2012  Study Conclusions  - Left ventricle: The cavity size was mildly dilated. Wall thickness was normal. Systolic function was severely reduced. The estimated ejection fraction was in the range of 20% to 25%. The entire mid to distal 2/3 of the ventricle is severely hypokinetic/akinetic. - Mitral valve: Mild regurgitation. - Pericardium, extracardiac: A small, free-flowing pericardial effusion was identified along the right atrial free wall.  Cardia Cath 07/21/12   Left main: Long. Mild plaque  LAD: Completely occluded proximally after first diagonal. 1st diagonal 30-40% ostial. Diffuse diabetic vasculopathy long 95-99% in  midsection  LCX: Small vessel (1.26mm) with severe diffuse diabetic vasculopathy. Ostial 80%  RAMUS: Moderate sized vessel. Diffuse 30%  RCA: Very large dominant vessel. Diffuse 40% in midsection.. 30% around distal bend. PDA moderate sized vessel subtotally occluded. 2 large PLs with mild plaque. Very fain collateral to LAD.  LV-gram done in the RAO projection: Ejection fraction = 15% Distal 2/3 of ventricle is akinetic  Assessment:  1. Severe 3v CAD with totally occluded LAD  2. Severe LV dysfunction  3. Mildly elevated filling pressures with preserved cardiac output   Cardiac MRI 07/22/12: IMPRESSION:  1. Normal LV size with severe systolic dysfunction, EF 123456. There  were wall motion abnormalities as described above.  2. RV normal in size and systolic function.  3. On delayed enhancement imaging, there was 26-50% wall thickness  subendocardial enhancement in the mid to apical inferoseptal,  anteroseptal, and anterior walls. This suggests that the LV  segments with wall motion abnormalities may improve in function  with revascularization (suggests viability).  Cardiac Catheterization 07/23/12 Conclusions:  Successful complex PCI of a chronic total occlusion in the mid LAD.  Recommendations: Dual antiplatelet therapy with aspirin and brilinta for a minimum of 12 months. Consideration of PCI of the diagonal branch in approximately 4 weeks depending on the patient's clinical course.   Assessment/Plan:  [name] is a 56 y.o. year old female with history of DM type 2, HLD presenting with 2 weeks worsening exertional dyspnea found to have new onset acute congestive heart failure.   1. Acute decompensated CHF NYHA Stage III, EF 15% - Heart failure team consulted yesterday due to worsening SOB, pitting edema.  Echo was done showing 20-25% EF with LV severely hypokinetic.  1) Pt taken to cath on 10/15 showing severe three vessel disease with completely occluded LAD.  Akinetic LV and  EF of 15%  2) MRI perfromed on 10/16 showed possible viable tissue with revascularization. Successful PCI of LAD on 10/17.    3) Will need aggressive HF tx in outpt setting  4) Will continue Lasix 40 mg daily.  Good diruresis and patient appears to be at dry weight.  5) Will contine Coreg, Digoxin, Lisinopril, and Spironolactone.  2.Recent STEMI with new onset LBBB / CAD- Recent changes in EKG with elevation in troponin's.  1) Pt taken to cath 10/15 showing complete occlusion of LAD with diabetic vasculopathy.  Repeat Cath 07/23/12 stenting the LAD.  Plan is to place stent in 4 weeks to the LCx and keep on Brilinta for 12 months and ASA life long  2) Most likely had a recent silent MI within the past several weeks   3) Will continue ASA, Statin, and Coreg, Lisinopril, Brilinta, and Nitro PRN   3. Diabetes mellitus  type 2. Last A1c 7.7. However, previously severely uncontrolled in previous years according to Dr. Nori Riis.   1) Will continue SSI while in house.   2) Will restart home Metformin and Amaryl on D/C.  Patient will need close follow up and titration of therapy.  4) Hyperlipidemia - LDL of 58 but complete occlusion of LAD. Will continue on statin  4. Neuropathic pain Continue neurontin and flexeril prn at home dose.  Also continue Pamelor 50 mg qd.   FEN/GI: heart healthy diet Prophylaxis: Lovenox 40 mg SQ qd Disposition: Planned D/C home today. Code Status: DNR per living will and talk with pt.

## 2012-07-26 NOTE — Progress Notes (Signed)
Subjective: Patient feels great  No SOB or CP Objective: Filed Vitals:   07/25/12 1519 07/25/12 2030 07/26/12 0043 07/26/12 0531  BP:  100/64 93/52 95/45   Pulse:  81 84 81  Temp:  97.8 F (36.6 C)  97.5 F (36.4 C)  TempSrc:  Oral  Oral  Resp:  18 18 18   Height:      Weight:    275 lb 14.4 oz (125.147 kg)  SpO2: 94% 94%  96%   Weight change: 0 lb (0 kg)  Intake/Output Summary (Last 24 hours) at 07/26/12 0726 Last data filed at 07/26/12 0500  Gross per 24 hour  Intake   1026 ml  Output   3050 ml  Net  -2024 ml    General: Alert, awake, oriented x3, in no acute distress Neck:  JVP is normal Heart: Regular rate and rhythm, without murmurs, rubs, gallops.  Lungs: Clear to auscultation.  No rales or wheezes. Exemities:  No edema.   Neuro: Grossly intact, nonfocal.   Lab Results: Results for orders placed during the hospital encounter of 07/20/12 (from the past 24 hour(s))  GLUCOSE, CAPILLARY     Status: Abnormal   Collection Time   07/25/12  8:00 AM      Component Value Range   Glucose-Capillary 170 (*) 70 - 99 mg/dL   Comment 1 Notify RN    BASIC METABOLIC PANEL     Status: Abnormal   Collection Time   07/25/12  9:47 AM      Component Value Range   Sodium 135  135 - 145 mEq/L   Potassium 3.9  3.5 - 5.1 mEq/L   Chloride 100  96 - 112 mEq/L   CO2 24  19 - 32 mEq/L   Glucose, Bld 326 (*) 70 - 99 mg/dL   BUN 24 (*) 6 - 23 mg/dL   Creatinine, Ser 1.15 (*) 0.50 - 1.10 mg/dL   Calcium 8.6  8.4 - 10.5 mg/dL   GFR calc non Af Amer 51 (*) >90 mL/min   GFR calc Af Amer 60 (*) >90 mL/min  GLUCOSE, CAPILLARY     Status: Abnormal   Collection Time   07/25/12 12:08 PM      Component Value Range   Glucose-Capillary 254 (*) 70 - 99 mg/dL   Comment 1 Notify RN    GLUCOSE, CAPILLARY     Status: Abnormal   Collection Time   07/25/12  4:37 PM      Component Value Range   Glucose-Capillary 185 (*) 70 - 99 mg/dL   Comment 1 Notify RN    CBC     Status: Abnormal   Collection  Time   07/26/12  6:05 AM      Component Value Range   WBC 7.1  4.0 - 10.5 K/uL   RBC 3.93  3.87 - 5.11 MIL/uL   Hemoglobin 10.8 (*) 12.0 - 15.0 g/dL   HCT 33.2 (*) 36.0 - 46.0 %   MCV 84.5  78.0 - 100.0 fL   MCH 27.5  26.0 - 34.0 pg   MCHC 32.5  30.0 - 36.0 g/dL   RDW 13.4  11.5 - 15.5 %   Platelets 200  150 - 400 K/uL    Studies/Results: @[id number] @  Medications:  Reviewed. 1. Acute systolic heart failure  2. ICM, EF 20-25%  3. Out of hospital anterior MI  4. CAD s/p complex PTCA and stenting of LAD (chronic total occlusion)  5. DM  6. Obesity  7.  HL  8. LBBB   Plan:  OK to discharge today.  VOlume looks good.  Will make sure that she has follow up with D. Bensimhon this wk.   F/u of further CAD and F/U echo in 90 days.      LOS: 6 days   Dorris Carnes 07/26/2012, 7:26 AM

## 2012-07-26 NOTE — Progress Notes (Signed)
FMTS Attending Note  Patient seen and examined by me, discussed with resident team and I agree with Dr Jonathon Jordan plan for discharge home today, with followup with [facility] Cardiology as per their notes. Is continuing on DualAntiplatelet therapy of Brilinta and ASA as per Cardiology.  Dalbert Mayotte, MD

## 2012-07-26 NOTE — Progress Notes (Signed)
HF team f/u.  Down 5 pounds. Agree with d/c.  Continue current HF meds with exception:  Change lasix 40 to demadex 40 daily + Kcl 20.  Has f/u with HF clinic on Wed.   D/w Dr. Lacinda Axon.   Micky Overturf,MD 10:46 AM

## 2012-07-27 NOTE — Discharge Summary (Signed)
Patient seen and examined by me on the day of discharge.  Please see separate note for details.  Dalbert Mayotte, MD

## 2012-07-29 ENCOUNTER — Ambulatory Visit (HOSPITAL_COMMUNITY)
Admit: 2012-07-29 | Discharge: 2012-07-29 | Disposition: A | Discharge status: Home / Self Care | Payer: BC Managed Care – PPO | Attending: Internal Medicine | Admitting: Internal Medicine

## 2012-07-29 ENCOUNTER — Encounter (HOSPITAL_COMMUNITY): Payer: Self-pay

## 2012-07-29 VITALS — BP 114/60 | HR 82 | Ht 68.0 in | Wt 276.4 lb

## 2012-07-29 DIAGNOSIS — I2510 Atherosclerotic heart disease of native coronary artery without angina pectoris: Secondary | ICD-10-CM | POA: Insufficient documentation

## 2012-07-29 DIAGNOSIS — I5022 Chronic systolic (congestive) heart failure: Secondary | ICD-10-CM | POA: Insufficient documentation

## 2012-07-29 LAB — BASIC METABOLIC PANEL
BUN: 31 mg/dL — ABNORMAL HIGH (ref 6–23)
Chloride: 98 mEq/L (ref 96–112)
Creatinine, Ser: 1.24 mg/dL — ABNORMAL HIGH (ref 0.50–1.10)
GFR calc non Af Amer: 47 mL/min — ABNORMAL LOW (ref >90)
Glucose, Bld: 216 mg/dL — ABNORMAL HIGH (ref 70–99)
Potassium: 4.8 mEq/L (ref 3.5–5.1)

## 2012-07-29 MED ORDER — CARVEDILOL 3.125 MG PO TABS: ORAL_TABLET | ORAL | Status: DC

## 2012-07-29 NOTE — Patient Instructions (Addendum)
Increase carvedilol 1 tab (3.125 mg) in the morning and 2 tabs (6.25 mg) in the evening.  Follow up 2 weeks.  Follow up 2 weeks.    Diet Class on November 5th at 2.  Do the following things EVERYDAY: 1) Weigh yourself in the morning before breakfast. Write it down and keep it in a log. 2) Take your medicines as prescribed 3) Eat low salt foods-Limit salt (sodium) to 2000 mg per day.  4) Stay as active as you can everyday 5) Limit all fluids for the day to less than 2 liters

## 2012-07-29 NOTE — Progress Notes (Signed)
Weight Range   275 pounds  Baseline proBNP   5000.6    HPI: [name] is a 56 y.o. female with past medical history of HL, morbid obesity, chronic pain and DM since she was 56 years old. She had neuropathy in 2011 causing her to be in a wheelchair, this recovered after 8 months of therapy.   Admitted to [facility] with progressive dyspnea and lower extremity edema.  Found to have new onset systolic HF.  ProBNP 5000.  Troponin 0.64. EKG LBBB.  She diuresed well on IV lasix.  Underwent RHC/LHC on 07/21/12 showing:   RA = 7  RV = 39/5/8  PA = 43/20 (31)  PCW = 17  Fick cardiac output/index = 5.4/2.3  PVR = 2.5 Woods  FA sat = 90%  PA sat = 58%, 57%  Ao Pressure: 118/77 (95)  LV Pressure: 116/21//2  Ao Pressure: 118/77 (95)  LV Pressure: 116/21//2  Left main: Long. Mild plaque  LAD: Completely occluded proximally after first diagonal. 1st diagonal 30-40% ostial. 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 Ostial 80%  RAMUS: Moderate sized vessel. Diffuse 30%  RCA: Very large dominant vessel. Diffuse 40% in midsection.. 30% around distal bend. PDA moderate sized vessel subtotally occluded. 2 large PLs with mild plaque. Very fain collateral to LAD.  LV-gram done in the RAO projection: Ejection fraction = 15% Distal 2/3 of ventricle is akinetic   After long discussion she underwent complex PTCA and stenting of LAD (chronic total occlusion) on 10/17 by Dr. Burt Knack.  She was placed on Brilinta and ASA for 1 year.  Possible PCI of diag in ~4 weeks depending clinical course.  Discharged on 10/18 at 275 pounds.  She returns for post hospital follow up with her mother.  She feels well.  Yesterday she had an episode of lightheadedness and nausea but no vomiting.  Nausea improved after eating.  SBP 83-90s.  No syncope.  No orthopnea/PND.  No chest pain.  Complaint with medications.  No edema.  Following low sodium diet.   Weight stable at 265 pounds since discharge.  No signs of bleeding.    ROS: All systems negative except as listed in HPI, PMH and Problem List.  Past Medical History  Diagnosis Date  . Pure hypercholesterolemia   . Obesity, unspecified   . Unspecified vitamin D deficiency   . Pure hypercholesterolemia   . Type II or unspecified type diabetes mellitus without mention of complication, not stated as uncontrolled   . Elevated blood pressure reading without diagnosis of hypertension   . Neuropathy 2011  . Chicken pox     Current Outpatient Prescriptions  Medication Sig Dispense Refill  . aspirin EC 81 MG tablet Take 81 mg by mouth daily.      . carvedilol (COREG) 3.125 MG tablet Take 1 tablet (3.125 mg total) by mouth 2 (two) times daily with a meal.  60 tablet  0  . cholecalciferol (VITAMIN D) 1000 UNITS tablet Take 1,000 Units by mouth daily.      . cyclobenzaprine (FLEXERIL) 10 MG tablet Take 10 mg by mouth at bedtime as needed. For pain/sleep      . digoxin (LANOXIN) 0.125 MG tablet Take 1 tablet (0.125 mg total) by mouth daily.  30 tablet  0  . gabapentin (NEURONTIN) 600 MG tablet Take 1 tablet (600 mg total) by mouth 3 (three) times daily.  90 tablet  5  . glimepiride (AMARYL)  4 MG tablet Take 1 tablet (4 mg total) by mouth 2 (two) times daily before a meal.  60 tablet  11  . glucose blood test strip 1 each by Other route as needed. Use as instructed      . Lancets MISC by Does not apply route.      Marland Kitchen lisinopril (PRINIVIL,ZESTRIL) 5 MG tablet Take 1 tablet (5 mg total) by mouth 2 (two) times daily.  60 tablet  0  . metFORMIN (GLUCOPHAGE) 1000 MG tablet Take 1 tablet (1,000 mg total) by mouth 2 (two) times daily with a meal.  60 tablet  11  . nitroGLYCERIN (NITROSTAT) 0.4 MG SL tablet Place 1 tablet (0.4 mg total) under the tongue every 5 (five) minutes as needed for chest pain.  30 tablet  0  . nortriptyline (PAMELOR) 50 MG capsule Take 1 capsule (50 mg total) by mouth at bedtime.  30  capsule  5  . potassium chloride (K-DUR) 10 MEQ tablet Take 2 tablets (20 mEq total) by mouth daily.  60 tablet  0  . pravastatin (PRAVACHOL) 20 MG tablet Take 1 tablet (20 mg total) by mouth at bedtime.  30 tablet  11  . spironolactone (ALDACTONE) 25 MG tablet Take 1 tablet (25 mg total) by mouth daily.  30 tablet  0  . Ticagrelor (BRILINTA) 90 MG TABS tablet Take 1 tablet (90 mg total) by mouth 2 (two) times daily.  60 tablet  0  . torsemide (DEMADEX) 20 MG tablet Take 2 tablets (40 mg total) by mouth daily.  60 tablet  0  . traMADol (ULTRAM) 50 MG tablet Take 50 mg by mouth every 6 (six) hours as needed. For pain         PHYSICAL EXAM: Filed Vitals:   07/29/12 1106  BP: 114/60  Pulse: 82  Height: 5\' 8"  (1.727 m)  Weight: 276 lb 6.4 oz (125.374 kg)  SpO2: 98%    General: Obese, Well appearing. No resp difficulty  HEENT: normal  Neck: supple. JVP difficult to assess. Carotids 2+ bilat; no bruits. No lymphadenopathy or thryomegaly appreciated.  Cor: PMI nondisplaced. Regular rate & rhythm. Split S2. No S3.  Lungs: clear  Abdomen: obese, soft, nontender, nondistended. No hepatosplenomegaly. No bruits or masses. Good bowel sounds.  Extremities: no cyanosis, clubbing, rash, edema  Neuro: alert & orientedx3, cranial nerves grossly intact. moves all 4 extremities w/o difficulty. Affect pleasant    ASSESSMENT & PLAN:

## 2012-07-29 NOTE — Assessment & Plan Note (Signed)
Volume status stable.  NYHA II-III.  Will titrate carvedilol 6.25 mg at night and continue 3.125 mg during the day she has had episodes of hypotension.  She will continue to monitor closely.  Continue lisinopril, spiro and dig.  Check BMET today and dig level.  Had extensive discussion concerning sliding scale demadex and sodium restrictions.  She will follow up with our Diet Class in 2 weeks.  All questions addressed today.  40 minutes with >50% of the time spent educating patient on medications and dietary restrictions.

## 2012-07-29 NOTE — Assessment & Plan Note (Addendum)
No ischemic symptoms, continue Brilinta and ASA for at least 1 year.  Possible PCI of diag in ~4 weeks depending on clinical course.  She will plan to go to cardiac rehab at [facility], awaiting call.

## 2012-07-31 ENCOUNTER — Telehealth (HOSPITAL_COMMUNITY): Payer: Self-pay | Admitting: *Deleted

## 2012-07-31 MED ORDER — TORSEMIDE 20 MG PO TABS: 20.0000 mg | ORAL_TABLET | Freq: Every day | ORAL | Status: DC

## 2012-07-31 NOTE — Telephone Encounter (Signed)
Per Leone Haven, PA decrease Torsemide to 20 mg daily, pt aware

## 2012-08-11 ENCOUNTER — Ambulatory Visit (HOSPITAL_COMMUNITY)
Admission: RE | Admit: 2012-08-11 | Discharge: 2012-08-11 | Disposition: A | Discharge status: Home / Self Care | Payer: Medicare Other | Source: Ambulatory Visit | Attending: Internal Medicine | Admitting: Internal Medicine

## 2012-08-11 VITALS — BP 120/64 | HR 80 | Wt 271.2 lb

## 2012-08-11 DIAGNOSIS — I5022 Chronic systolic (congestive) heart failure: Secondary | ICD-10-CM | POA: Insufficient documentation

## 2012-08-11 DIAGNOSIS — I2510 Atherosclerotic heart disease of native coronary artery without angina pectoris: Secondary | ICD-10-CM | POA: Insufficient documentation

## 2012-08-11 LAB — BASIC METABOLIC PANEL
BUN: 31 mg/dL — ABNORMAL HIGH (ref 6–23)
Chloride: 106 mEq/L (ref 96–112)
Creatinine, Ser: 1.66 mg/dL — ABNORMAL HIGH (ref 0.50–1.10)
Glucose, Bld: 120 mg/dL — ABNORMAL HIGH (ref 70–99)
Potassium: 5.9 mEq/L — ABNORMAL HIGH (ref 3.5–5.1)

## 2012-08-11 MED ORDER — TORSEMIDE 20 MG PO TABS: 20.0000 mg | ORAL_TABLET | Freq: Every day | ORAL | Status: DC | PRN

## 2012-08-11 MED ORDER — POTASSIUM CHLORIDE ER 10 MEQ PO TBCR: 20.0000 meq | EXTENDED_RELEASE_TABLET | Freq: Every day | ORAL | Status: DC | PRN

## 2012-08-11 NOTE — Assessment & Plan Note (Signed)
No ischemic symptoms, continue current therapy.

## 2012-08-11 NOTE — Assessment & Plan Note (Signed)
Volume statue very stable and may be a little euvolemic with SBP 74-96 (although was 107 today at home).  Will use demadex/potassium only as needed for weight 268 pounds or greater.  She voices understanding.  Will not titrate meds further with soft BP at home.  She is going to check her cuff against a second BP cuff to ensure accuracy.  Check BMET today.  She is attending the HF Diet Class today with her mom. Follow up 3 weeks.

## 2012-08-11 NOTE — Progress Notes (Signed)
Weight Range   275 pounds  Baseline proBNP   5000.6    HPI: [name] is a 56 y.o. female with past medical history of HL, morbid obesity, chronic pain and DM since she was 56 years old. She had neuropathy in 2011 causing her to be in a wheelchair, this recovered after 8 months of therapy.   Admitted to [facility] with progressive dyspnea and lower extremity edema.  Found to have new onset systolic HF.  ProBNP 5000.  Troponin 0.64. EKG LBBB.  She diuresed well on IV lasix.  Underwent RHC/LHC on 07/21/12 showing:   RA = 7  RV = 39/5/8  PA = 43/20 (31)  PCW = 17  Fick cardiac output/index = 5.4/2.3  PVR = 2.5 Woods  FA sat = 90%  PA sat = 58%, 57%  Ao Pressure: 118/77 (95)  LV Pressure: 116/21//2  Ao Pressure: 118/77 (95)  LV Pressure: 116/21//2  Left main: Long. Mild plaque  LAD: Completely occluded proximally after first diagonal. 1st diagonal 30-40% ostial. 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 Ostial 80%  RAMUS: Moderate sized vessel. Diffuse 30%  RCA: Very large dominant vessel. Diffuse 40% in midsection.. 30% around distal bend. PDA moderate sized vessel subtotally occluded. 2 large PLs with mild plaque. Very fain collateral to LAD.  LV-gram done in the RAO projection: Ejection fraction = 15% Distal 2/3 of ventricle is akinetic   After long discussion she underwent complex PTCA and stenting of LAD (chronic total occlusion) on 10/17 by Dr. Burt Knack.  She was placed on Brilinta and ASA for 1 year.  Possible PCI of diag in ~4 weeks depending clinical course.  Discharged on 10/18 at 275 pounds.  She returns for post hospital follow up with her mother.  She continues to feel well.  SBP at home running 72-96.  She denies dizziness.  Weight down from 269 to 265 pounds.  She is compliant with meds.  Torsemide was cut back from 40 mg daily to 20 mg daily due to rise in Cr from 1.07 to 1.24.  No  orthopnea/PND.  No chest pain.  No bleeding.  ROS: All systems negative except as listed in HPI, PMH and Problem List.  Past Medical History  Diagnosis Date  . Pure hypercholesterolemia   . Obesity, unspecified   . Unspecified vitamin D deficiency   . Pure hypercholesterolemia   . Type II or unspecified type diabetes mellitus without mention of complication, not stated as uncontrolled   . Elevated blood pressure reading without diagnosis of hypertension   . Neuropathy 2011  . Chicken pox     Current Outpatient Prescriptions  Medication Sig Dispense Refill  . aspirin EC 81 MG tablet Take 81 mg by mouth daily.      . carvedilol (COREG) 3.125 MG tablet 1 tab in morning and 2 tabs in the evening.  60 tablet  0  . cholecalciferol (VITAMIN D) 1000 UNITS tablet Take 1,000 Units by mouth daily.      . cyclobenzaprine (FLEXERIL) 10 MG tablet Take 10 mg by mouth at bedtime as needed. For pain/sleep      . digoxin (LANOXIN) 0.125 MG tablet Take 1 tablet (0.125 mg total) by mouth daily.  30 tablet  0  . gabapentin (NEURONTIN) 600 MG tablet Take 1 tablet (600 mg total) by mouth 3 (three) times daily.  90 tablet  5  . glimepiride (AMARYL) 4 MG tablet Take 1  tablet (4 mg total) by mouth 2 (two) times daily before a meal.  60 tablet  11  . glucose blood test strip 1 each by Other route as needed. Use as instructed      . Lancets MISC by Does not apply route.      Marland Kitchen lisinopril (PRINIVIL,ZESTRIL) 5 MG tablet Take 1 tablet (5 mg total) by mouth 2 (two) times daily.  60 tablet  0  . metFORMIN (GLUCOPHAGE) 1000 MG tablet Take 1 tablet (1,000 mg total) by mouth 2 (two) times daily with a meal.  60 tablet  11  . nitroGLYCERIN (NITROSTAT) 0.4 MG SL tablet Place 1 tablet (0.4 mg total) under the tongue every 5 (five) minutes as needed for chest pain.  30 tablet  0  . nortriptyline (PAMELOR) 50 MG capsule Take 1 capsule (50 mg total) by mouth at bedtime.  30 capsule  5  . potassium chloride (K-DUR) 10 MEQ  tablet Take 2 tablets (20 mEq total) by mouth daily.  60 tablet  0  . pravastatin (PRAVACHOL) 20 MG tablet Take 1 tablet (20 mg total) by mouth at bedtime.  30 tablet  11  . spironolactone (ALDACTONE) 25 MG tablet Take 1 tablet (25 mg total) by mouth daily.  30 tablet  0  . Ticagrelor (BRILINTA) 90 MG TABS tablet Take 1 tablet (90 mg total) by mouth 2 (two) times daily.  60 tablet  0  . torsemide (DEMADEX) 20 MG tablet Take 1 tablet (20 mg total) by mouth daily.  60 tablet  0  . traMADol (ULTRAM) 50 MG tablet Take 50 mg by mouth every 6 (six) hours as needed. For pain         PHYSICAL EXAM: Filed Vitals:   08/11/12 1337  BP: 120/64  Pulse: 80  Weight: 271 lb 4 oz (123.038 kg)  SpO2: 100%    General: Obese, Well appearing. No resp difficulty  HEENT: normal  Neck: supple. JVP difficult to assess. Carotids 2+ bilat; no bruits. No lymphadenopathy or thryomegaly appreciated.  Cor: PMI nondisplaced. Regular rate & rhythm. Split S2. No S3.  Lungs: clear  Abdomen: obese, soft, nontender, nondistended. No hepatosplenomegaly. No bruits or masses. Good bowel sounds.  Extremities: no cyanosis, clubbing, rash, edema  Neuro: alert & orientedx3, cranial nerves grossly intact. moves all 4 extremities w/o difficulty. Affect pleasant    ASSESSMENT & PLAN:

## 2012-08-11 NOTE — Patient Instructions (Addendum)
Only take torsemide 20 mg (1 tab) and potassium 20 mEq (2 tabs) if weight is 268 or more.  Labs today.  Follow up 3 weeks.

## 2012-08-17 ENCOUNTER — Telehealth: Payer: Self-pay

## 2012-08-17 ENCOUNTER — Telehealth (HOSPITAL_COMMUNITY): Payer: Self-pay | Admitting: *Deleted

## 2012-08-17 ENCOUNTER — Ambulatory Visit (INDEPENDENT_AMBULATORY_CARE_PROVIDER_SITE_OTHER): Payer: Medicare Other

## 2012-08-17 DIAGNOSIS — I5022 Chronic systolic (congestive) heart failure: Secondary | ICD-10-CM

## 2012-08-17 MED ORDER — LISINOPRIL 5 MG PO TABS: 5.0000 mg | ORAL_TABLET | Freq: Two times a day (BID) | ORAL | Status: DC

## 2012-08-17 MED ORDER — DIGOXIN 125 MCG PO TABS: 0.1250 mg | ORAL_TABLET | Freq: Every day | ORAL | Status: DC

## 2012-08-17 MED ORDER — POTASSIUM CHLORIDE ER 10 MEQ PO TBCR: 20.0000 meq | EXTENDED_RELEASE_TABLET | Freq: Every day | ORAL | Status: DC | PRN

## 2012-08-17 MED ORDER — TORSEMIDE 20 MG PO TABS: 20.0000 mg | ORAL_TABLET | Freq: Every day | ORAL | Status: DC | PRN

## 2012-08-17 MED ORDER — TICAGRELOR 90 MG PO TABS: 90.0000 mg | ORAL_TABLET | Freq: Two times a day (BID) | ORAL | Status: DC

## 2012-08-17 MED ORDER — SPIRONOLACTONE 25 MG PO TABS: 25.0000 mg | ORAL_TABLET | Freq: Every day | ORAL | Status: DC

## 2012-08-17 MED ORDER — PRAVASTATIN SODIUM 20 MG PO TABS: 20.0000 mg | ORAL_TABLET | Freq: Every day | ORAL | Status: DC

## 2012-08-17 NOTE — Telephone Encounter (Signed)
Patient came in for lab work today in the [place] office. The patient needs to have all her medications that Dr. Haroldine Laws prescribes sent to [facility] in [place].

## 2012-08-17 NOTE — Telephone Encounter (Signed)
Message copied by Scarlette Calico on Mon Aug 17, 2012 11:47 AM ------      Message from: Wynetta Emery      Created: Wed Aug 12, 2012  4:03 PM       Lasix and potassium stopped yesterday at office visit.  She will drink a little extra fluid today.  Recheck labs in [place] on Friday or Monday.  Patient is aware of results.  Nira Conn will you please schedule recheck... Thanks.

## 2012-08-18 ENCOUNTER — Encounter (HOSPITAL_COMMUNITY): Payer: Self-pay | Admitting: *Deleted

## 2012-08-18 ENCOUNTER — Emergency Department (HOSPITAL_COMMUNITY)
Admission: EM | Admit: 2012-08-18 | Discharge: 2012-08-18 | Disposition: A | Discharge status: Home / Self Care | Payer: Medicare Other | Attending: Emergency Medicine | Admitting: Emergency Medicine

## 2012-08-18 ENCOUNTER — Other Ambulatory Visit: Payer: Self-pay

## 2012-08-18 DIAGNOSIS — I252 Old myocardial infarction: Secondary | ICD-10-CM | POA: Insufficient documentation

## 2012-08-18 DIAGNOSIS — E669 Obesity, unspecified: Secondary | ICD-10-CM | POA: Insufficient documentation

## 2012-08-18 DIAGNOSIS — I2510 Atherosclerotic heart disease of native coronary artery without angina pectoris: Secondary | ICD-10-CM | POA: Insufficient documentation

## 2012-08-18 DIAGNOSIS — Z79899 Other long term (current) drug therapy: Secondary | ICD-10-CM | POA: Insufficient documentation

## 2012-08-18 DIAGNOSIS — E875 Hyperkalemia: Secondary | ICD-10-CM | POA: Insufficient documentation

## 2012-08-18 DIAGNOSIS — E559 Vitamin D deficiency, unspecified: Secondary | ICD-10-CM | POA: Insufficient documentation

## 2012-08-18 DIAGNOSIS — Z8619 Personal history of other infectious and parasitic diseases: Secondary | ICD-10-CM | POA: Insufficient documentation

## 2012-08-18 DIAGNOSIS — E7800 Pure hypercholesterolemia, unspecified: Secondary | ICD-10-CM | POA: Insufficient documentation

## 2012-08-18 DIAGNOSIS — Z8669 Personal history of other diseases of the nervous system and sense organs: Secondary | ICD-10-CM | POA: Insufficient documentation

## 2012-08-18 DIAGNOSIS — E119 Type 2 diabetes mellitus without complications: Secondary | ICD-10-CM | POA: Insufficient documentation

## 2012-08-18 DIAGNOSIS — Z7982 Long term (current) use of aspirin: Secondary | ICD-10-CM | POA: Insufficient documentation

## 2012-08-18 LAB — CBC WITH DIFFERENTIAL/PLATELET
Basophils Absolute: 0 10*3/uL (ref 0.0–0.1)
Eosinophils Absolute: 0.2 10*3/uL (ref 0.0–0.7)
Eosinophils Relative: 2 % (ref 0–5)
HCT: 36.4 % (ref 36.0–46.0)
Lymphocytes Relative: 21 % (ref 12–46)
MCH: 27.8 pg (ref 26.0–34.0)
MCHC: 33.2 g/dL (ref 30.0–36.0)
MCV: 83.5 fL (ref 78.0–100.0)
Monocytes Absolute: 0.5 10*3/uL (ref 0.1–1.0)
RDW: 13.8 % (ref 11.5–15.5)
WBC: 7.8 10*3/uL (ref 4.0–10.5)

## 2012-08-18 LAB — BASIC METABOLIC PANEL
BUN/Creatinine Ratio: 24 — ABNORMAL HIGH (ref 9–23)
BUN: 25 mg/dL — ABNORMAL HIGH (ref 6–23)
CO2: 24 mEq/L (ref 19–32)
Calcium: 9.4 mg/dL (ref 8.4–10.5)
Chloride: 101 mEq/L (ref 96–112)
Creatinine, Ser: 1.04 mg/dL — ABNORMAL HIGH (ref 0.57–1.00)
Creatinine, Ser: 1.1 mg/dL (ref 0.50–1.10)
GFR calc non Af Amer: 59 mL/min/{1.73_m2} — ABNORMAL LOW (ref >59)
Glucose, Bld: 285 mg/dL — ABNORMAL HIGH (ref 70–99)
Potassium: 6.3 mmol/L — ABNORMAL HIGH (ref 3.5–5.2)
Sodium: 135 mmol/L (ref 134–144)

## 2012-08-18 LAB — POCT I-STAT, CHEM 8
Calcium, Ion: 1.19 mmol/L (ref 1.12–1.23)
Creatinine, Ser: 1.1 mg/dL (ref 0.50–1.10)
Glucose, Bld: 272 mg/dL — ABNORMAL HIGH (ref 70–99)
Hemoglobin: 12.2 g/dL (ref 12.0–15.0)
Potassium: 5.6 mEq/L — ABNORMAL HIGH (ref 3.5–5.1)

## 2012-08-18 MED ORDER — SODIUM CHLORIDE 0.9 % IV SOLN
INTRAVENOUS | Status: DC
  Administered 2012-08-18: 125 mL/h via INTRAVENOUS

## 2012-08-18 MED ORDER — SODIUM POLYSTYRENE SULFONATE 15 GM/60ML PO SUSP
30.0000 g | Freq: Once | ORAL | Status: AC
  Administered 2012-08-18: 30 g via ORAL
  Filled 2012-08-18: qty 120

## 2012-08-18 NOTE — ED Provider Notes (Signed)
History     CSN: [id number]  Arrival date & time 08/18/12  1203   First MD Initiated Contact with Patient 08/18/12 1253      Chief Complaint  Patient presents with  . High potassium     (Consider location/radiation/quality/duration/timing/severity/associated sxs/prior treatment) HPI Here after being called by cardiologist office for elevated potassium of 6.3. Had lab work done yesterday as part of routine follow up for CHF.  It was done in [facility]. Denies any symptoms yesterday or today.  Specifically no chest pain. No prior treatment for this. No modifiers. Pt has recently been taking kcl with her torsemide for fluid overload. She has not taken it for the past few days as recent instructions are to only take if gaining weight which she has not.   Past Medical History  Diagnosis Date  . Pure hypercholesterolemia   . Obesity, unspecified   . Unspecified vitamin D deficiency   . Pure hypercholesterolemia   . Type II or unspecified type diabetes mellitus without mention of complication, not stated as uncontrolled   . Elevated blood pressure reading without diagnosis of hypertension   . Neuropathy 2011  . Chicken pox   . Coronary artery disease   . Myocardial infarct     Past Surgical History  Procedure Date  . Lumbar back surgery 1996    L4-5  . Groin abscess 2007    I & D with ICU stay due to sepsis.  . Gallbladder surgery 2011  . Total abdominal hysterectomy 01/2011  . Bilateral oophorectomy 01/2011    ovarian cyst benign    Family History  Problem Relation Age of Onset  . Diabetes Mother   . Cancer Father     prostate,colon,skin,lymph node  . Obesity Brother     History  Substance Use Topics  . Smoking status: Never Smoker   . Smokeless tobacco: Not on file  . Alcohol Use: No    OB History    Grav Para Term Preterm Abortions TAB SAB Ect Mult Living                  Review of Systems Negative for respiratory distress, cough. Negative for vomiting,  diarrhea.  All other systems reviewed and negative unless noted in HPI.    Allergies  Review of patient's allergies indicates no known allergies.  Home Medications   Current Outpatient Rx  Name  Route  Sig  Dispense  Refill  . ASPIRIN EC 81 MG PO TBEC   Oral   Take 81 mg by mouth daily.         Marland Kitchen CARVEDILOL 3.125 MG PO TABS      1 tab in morning and 2 tabs in the evening.   60 tablet   0   . VITAMIN D 1000 UNITS PO TABS   Oral   Take 1,000 Units by mouth daily.         . CYCLOBENZAPRINE HCL 10 MG PO TABS   Oral   Take 10 mg by mouth at bedtime as needed. For pain/sleep         . DIGOXIN 0.125 MG PO TABS   Oral   Take 1 tablet (0.125 mg total) by mouth daily.   90 tablet   3   . GABAPENTIN 600 MG PO TABS   Oral   Take 1 tablet (600 mg total) by mouth 3 (three) times daily.   90 tablet   5   . GLIMEPIRIDE 4 MG PO TABS  Oral   Take 1 tablet (4 mg total) by mouth 2 (two) times daily before a meal.   60 tablet   11   . GLUCOSE BLOOD VI STRP   Other   1 each by Other route as needed. Use as instructed         . LANCETS MISC   Does not apply   by Does not apply route.         Marland Kitchen LISINOPRIL 5 MG PO TABS   Oral   Take 1 tablet (5 mg total) by mouth 2 (two) times daily.   180 tablet   3   . METFORMIN HCL 1000 MG PO TABS   Oral   Take 1 tablet (1,000 mg total) by mouth 2 (two) times daily with a meal.   60 tablet   11   . NITROGLYCERIN 0.4 MG SL SUBL   Sublingual   Place 1 tablet (0.4 mg total) under the tongue every 5 (five) minutes as needed for chest pain.   30 tablet   0   . NORTRIPTYLINE HCL 50 MG PO CAPS   Oral   Take 1 capsule (50 mg total) by mouth at bedtime.   30 capsule   5   . POTASSIUM CHLORIDE ER 10 MEQ PO TBCR   Oral   Take 2 tablets (20 mEq total) by mouth daily as needed. Take only when you take demadex.   180 tablet   3   . PRAVASTATIN SODIUM 20 MG PO TABS   Oral   Take 1 tablet (20 mg total) by mouth at  bedtime.   90 tablet   3   . SPIRONOLACTONE 25 MG PO TABS   Oral   Take 1 tablet (25 mg total) by mouth daily.   90 tablet   3   . TICAGRELOR 90 MG PO TABS   Oral   Take 1 tablet (90 mg total) by mouth 2 (two) times daily.   180 tablet   3   . TORSEMIDE 20 MG PO TABS   Oral   Take 1 tablet (20 mg total) by mouth daily as needed.   90 tablet   3   . TRAMADOL HCL 50 MG PO TABS   Oral   Take 50 mg by mouth every 6 (six) hours as needed. For pain           BP 137/100  Pulse 81  Temp 97.7 F (36.5 C) (Oral)  Resp 18  SpO2 96%  Physical Exam Nursing note and vitals reviewed.  Constitutional: Pt is alert and appears stated age. Oropharynx: Airway open without erythema or exudate. Respiratory: No respiratory distress. Equal breathing bilaterally. CV: Extremities warm and well perfused. Neuro: No motor nor sensory deficit. Head: Normocephalic and atraumatic. Eyes: No conjunctivitis, no scleral icterus. Neck: Supple, no mass. Chest: Non-tender. Abdomen: Soft, non-tender MSK: Extremities are atraumatic without deformity. Skin: No rash, no wounds.  ED Course  Procedures (including critical care time)  Labs Reviewed  BASIC METABOLIC PANEL - Abnormal; Notable for the following:    Potassium 5.7 (*)     Glucose, Bld 285 (*)     BUN 25 (*)     GFR calc non Af Amer 54 (*)     GFR calc Af Amer 63 (*)     All other components within normal limits  POCT I-STAT, CHEM 8 - Abnormal; Notable for the following:    Potassium 5.6 (*)     BUN  28 (*)     Glucose, Bld 272 (*)     All other components within normal limits  CBC WITH DIFFERENTIAL  POCT I-STAT TROPONIN I   No results found.   1. High potassium       MDM  56 y.o. female here with high potassium of 6.3.  She looks well here and has no complaints. Pertinent past problems include CAD, HTN, CHF. EKG with no QRS widening or peaked T waves. No ischemic changes.   Medications/interventions:  none.  Data  reviewed: Record review: reviewed recorded lab test. EKG ordered and interpreted by me: NSR, no axis deviation, no QRS widening and no significant changes from prior EKG.  Lab tests ordered and reviewed by me: troponin low, CBC unremarkable, BMP with K of 5.7, elevated glucose, normal CO2.  Course of care: Pt doing well on eval. No complaints. Discussed results. Do not feel pt requires admission or further work up here. Feel increased K due to prior intake which she is no longer doing. Feel appropriate course would be to stop and have close follow up. Called cardiologist on call who directed me to talk with Leone Haven, PA for Dr. Haroldine Laws. Asked about spironolactone with request to hold. Request from Dr. Scharlene Gloss to give kayexalate so order placed. Pt advised of recommendations. Counseling provided regarding diagnosis, treatment plan, follow up recommendations, and return precautions. Questions answered.   Medical Decision Making discussed with ED attending Barbara Cower, MD          Dayton Scrape, MD 08/18/12 [phone]

## 2012-08-18 NOTE — ED Provider Notes (Signed)
I saw and evaluated the patient, reviewed the resident's note and I agree with the findings and plan. Has hx of ami and dm.  Was seeing her cardiologist yest.  For routing visit.  Blood was drawn.   K+ noted to be high. Called and told to come to ed. Pt is asx.   She is on lasix and K+ supplements.   ecg no peaked tw or arrhythmia. K+ 5.7.  Will discuss with cards.   Barbara Cower, MD 08/18/12 1431

## 2012-08-18 NOTE — ED Notes (Signed)
Per Dr. Haroldine Laws office patient was told to come to the hospital immediately for treatment of high potassium.  Pt has no other symptoms.  Pt had mi in October this year and only symptom was sob.

## 2012-08-18 NOTE — ED Notes (Signed)
Pt did noticed sob with exertion today.  No chest pain

## 2012-08-19 ENCOUNTER — Telehealth (HOSPITAL_COMMUNITY): Payer: Self-pay | Admitting: *Deleted

## 2012-08-19 DIAGNOSIS — I5022 Chronic systolic (congestive) heart failure: Secondary | ICD-10-CM

## 2012-08-19 MED ORDER — CARVEDILOL 3.125 MG PO TABS: ORAL_TABLET | ORAL | Status: DC

## 2012-08-19 NOTE — Telephone Encounter (Signed)
Pt was treated in ER 11/12 for hyperkalemia, per Dr Haroldine Laws make sure she has stopped Arlyce Harman and schedule repeat bmet in 1 week, pt is aware and will go to [facility] in [place] for labs on Tue 11/19

## 2012-08-19 NOTE — ED Provider Notes (Signed)
I saw and evaluated the patient, reviewed the resident's note and I agree with the findings and plan.  Barbara Cower, MD 08/19/12 [phone]

## 2012-08-25 ENCOUNTER — Ambulatory Visit (INDEPENDENT_AMBULATORY_CARE_PROVIDER_SITE_OTHER): Payer: Medicare Other

## 2012-08-25 ENCOUNTER — Encounter (HOSPITAL_COMMUNITY): Payer: Self-pay | Admitting: *Deleted

## 2012-08-25 DIAGNOSIS — I5022 Chronic systolic (congestive) heart failure: Secondary | ICD-10-CM

## 2012-08-26 ENCOUNTER — Telehealth (HOSPITAL_COMMUNITY): Payer: Self-pay | Admitting: *Deleted

## 2012-08-26 DIAGNOSIS — I5022 Chronic systolic (congestive) heart failure: Secondary | ICD-10-CM

## 2012-08-26 LAB — BASIC METABOLIC PANEL
BUN/Creatinine Ratio: 20 (ref 9–23)
CO2: 19 mmol/L (ref 19–28)
Creatinine, Ser: 1.32 mg/dL — ABNORMAL HIGH (ref 0.57–1.00)
Potassium: 5.6 mmol/L — ABNORMAL HIGH (ref 3.5–5.2)
Sodium: 141 mmol/L (ref 134–144)

## 2012-08-26 NOTE — Telephone Encounter (Signed)
Repeat labs:

## 2012-08-27 ENCOUNTER — Telehealth (HOSPITAL_COMMUNITY): Payer: Self-pay | Admitting: *Deleted

## 2012-08-27 ENCOUNTER — Encounter (HOSPITAL_COMMUNITY): Payer: Self-pay | Admitting: Internal Medicine

## 2012-08-27 NOTE — Telephone Encounter (Signed)
Pt states she received a jury summons for 12/3 and doesn't feel like she can do it, she would like an excuse letter.  She is summons for 09/08/12 at [facility], she is Juror # [phone].  Will send to Dr Haroldine Laws to complete

## 2012-08-27 NOTE — Telephone Encounter (Signed)
Pt aware letter ready and she will pick it up in the AM

## 2012-09-01 ENCOUNTER — Ambulatory Visit (INDEPENDENT_AMBULATORY_CARE_PROVIDER_SITE_OTHER): Payer: Medicare Other

## 2012-09-01 DIAGNOSIS — I5022 Chronic systolic (congestive) heart failure: Secondary | ICD-10-CM

## 2012-09-02 ENCOUNTER — Other Ambulatory Visit (HOSPITAL_COMMUNITY): Payer: Self-pay | Admitting: *Deleted

## 2012-09-02 LAB — BASIC METABOLIC PANEL
BUN/Creatinine Ratio: 16 (ref 9–23)
CO2: 18 mmol/L — ABNORMAL LOW (ref 19–28)
Creatinine, Ser: 1.08 mg/dL — ABNORMAL HIGH (ref 0.57–1.00)
GFR calc non Af Amer: 57 mL/min/{1.73_m2} — ABNORMAL LOW (ref >59)
Potassium: 4.7 mmol/L (ref 3.5–5.2)
Sodium: 137 mmol/L (ref 134–144)

## 2012-09-02 MED ORDER — CARVEDILOL 3.125 MG PO TABS: ORAL_TABLET | ORAL | Status: DC

## 2012-09-07 ENCOUNTER — Encounter: Payer: Self-pay | Admitting: Family Medicine

## 2012-09-07 ENCOUNTER — Other Ambulatory Visit: Payer: Self-pay | Admitting: Family Medicine

## 2012-09-07 ENCOUNTER — Ambulatory Visit (INDEPENDENT_AMBULATORY_CARE_PROVIDER_SITE_OTHER): Payer: Medicare Other | Admitting: Family Medicine

## 2012-09-07 VITALS — BP 120/66 | HR 72 | Temp 97.6°F | Resp 16 | Ht 68.0 in | Wt 270.8 lb

## 2012-09-07 DIAGNOSIS — E119 Type 2 diabetes mellitus without complications: Secondary | ICD-10-CM

## 2012-09-07 DIAGNOSIS — E785 Hyperlipidemia, unspecified: Secondary | ICD-10-CM

## 2012-09-07 DIAGNOSIS — I2510 Atherosclerotic heart disease of native coronary artery without angina pectoris: Secondary | ICD-10-CM

## 2012-09-07 DIAGNOSIS — I5020 Unspecified systolic (congestive) heart failure: Secondary | ICD-10-CM

## 2012-09-07 DIAGNOSIS — I5021 Acute systolic (congestive) heart failure: Secondary | ICD-10-CM

## 2012-09-07 DIAGNOSIS — E7800 Pure hypercholesterolemia, unspecified: Secondary | ICD-10-CM

## 2012-09-07 DIAGNOSIS — E669 Obesity, unspecified: Secondary | ICD-10-CM

## 2012-09-07 DIAGNOSIS — E875 Hyperkalemia: Secondary | ICD-10-CM

## 2012-09-07 LAB — COMPREHENSIVE METABOLIC PANEL
BUN: 17 mg/dL (ref 6–23)
CO2: 26 mEq/L (ref 19–32)
Calcium: 8.6 mg/dL (ref 8.4–10.5)
Chloride: 106 mEq/L (ref 96–112)
Creat: 1.11 mg/dL — ABNORMAL HIGH (ref 0.50–1.10)
Total Bilirubin: 0.4 mg/dL (ref 0.3–1.2)

## 2012-09-07 LAB — CBC WITH DIFFERENTIAL/PLATELET
Hemoglobin: 11.6 g/dL — ABNORMAL LOW (ref 12.0–15.0)
Lymphocytes Relative: 20 % (ref 12–46)
Lymphs Abs: 1.4 10*3/uL (ref 0.7–4.0)
Monocytes Relative: 6 % (ref 3–12)
Neutro Abs: 5 10*3/uL (ref 1.7–7.7)
Neutrophils Relative %: 72 % (ref 43–77)
RBC: 4.25 MIL/uL (ref 3.87–5.11)

## 2012-09-07 LAB — CK: Total CK: 61 U/L (ref 7–177)

## 2012-09-07 LAB — LIPID PANEL
Cholesterol: 124 mg/dL (ref 0–200)
HDL: 29 mg/dL — ABNORMAL LOW (ref >39)
Total CHOL/HDL Ratio: 4.3 Ratio
Triglycerides: 291 mg/dL — ABNORMAL HIGH (ref <150)
VLDL: 58 mg/dL — ABNORMAL HIGH (ref 0–40)

## 2012-09-07 NOTE — Assessment & Plan Note (Signed)
Controlled; obtain labs; continue current medications; encourage pt to check sugars at least twice weekly.

## 2012-09-07 NOTE — Assessment & Plan Note (Signed)
[facility] records reviewed in detail during visit.  Never suffered with chest pain prior to admission.  Continue current regimen.  May warrant further stenting in upcoming month.

## 2012-09-07 NOTE — Assessment & Plan Note (Signed)
Controlled; obtain labs; continue current medication.

## 2012-09-07 NOTE — Patient Instructions (Addendum)
1. Type II or unspecified type diabetes mellitus without mention of complication, not stated as uncontrolled  Comprehensive metabolic panel, Hemoglobin A1c  2. Pure hypercholesterolemia  CK, CBC with Differential, Lipid panel  3. Systolic CHF    4. Coronary artery disease    5. Hyperkalemia

## 2012-09-07 NOTE — Assessment & Plan Note (Signed)
New onset.  S/p admission; records reviewed in detail during visit.  Clinically improving. Weighing self daily.  Using Lasix PRN weight gain/volume excess.  Followed closely by cardiology every two weeks since discharge.  Has not initiated cardiac rehab yet due to expense; to discuss with cardiology tomorrow.  Symptoms controlled on current regimen.

## 2012-09-07 NOTE — Progress Notes (Signed)
[place], [place]  [address]   [phone]  Subjective:    Patient ID: [name], female    DOB: Apr 20, 1956, 56 y.o.   MRN: [id number]  HPIThis 56 y.o. female presents for transition into care.  Admitted to [facility] with new onset acute systolic heart failure in 07/2012.    On July 19, 2012 out with granddaughter walking around.  Had stopped to eat at restaurant and had DOE.  By end of afternoon, DOE worsened.  Progressively worsened; presented to [facility].  Evaluated by Dr. Marin Comment at [facility]; EKG with new onset LBBB and ST changes.  Transported to ED by EMS.  Admitted for one week.  Felt suffered with AMI two weeks before admission.  No chest pain in past month prior to admission.  S/p cardiac catheterization by Burt Knack with stent placement to LAD with EF of 15%.  S/p echocardiogram in hospital with EF 20-25%.  No repeat echo since hospital discharge.  Follow-up with cardiology tomorrow; has been followed once every two weeks with cardiology.  No longer suffering with orthopnea.  DOE mild and persistent but not daily.  Weighing self daily; baseline weight at home 265.  Not taking Lasix daily at this point; only takes Lasix 40mg  if weight increases.  Recommended cardiac rehab at [facility]; copay will be $35 each session; wants to go three times per week; plans on talking to cardiology tomorrow regarding expense.    2. Hyperkalemia:  Persistent issue since hospital discharge. Cardiology stopped KCL, Spironalactone, and Torsemide.  Also stopped Lisinopril.    3.  HTN:  Blood pressure 116/70 this morning; checking blood pressure every morning.  Had suffered with lower blood pressures; changed cuff and blood pressures have been more normal.     4.  DMII:  Has not checked sugars since last visit.  Sugar last week at cardiology office of 300.    5. S/p flu vaccine during admission.   Review of Systems  Constitutional: Positive for fatigue. Negative for fever, chills, diaphoresis,  activity change and appetite change.  Respiratory: Positive for shortness of breath. Negative for wheezing and stridor.   Cardiovascular: Positive for leg swelling. Negative for chest pain and palpitations.  Gastrointestinal: Negative for nausea, vomiting, abdominal pain and diarrhea.  Skin: Negative for color change, pallor, rash and wound.  Neurological: Negative for dizziness, tremors, syncope, facial asymmetry, speech difficulty, weakness, light-headedness, numbness and headaches.    Past Medical History  Diagnosis Date  . Pure hypercholesterolemia   . Obesity, unspecified   . Unspecified vitamin D deficiency   . Pure hypercholesterolemia   . Type II or unspecified type diabetes mellitus without mention of complication, not stated as uncontrolled   . Elevated blood pressure reading without diagnosis of hypertension   . Neuropathy 2011  . Chicken pox   . Coronary artery disease   . Myocardial infarct     Past Surgical History  Procedure Date  . Lumbar back surgery 1996    L4-5  . Groin abscess 2007    I & D with ICU stay due to sepsis.  . Gallbladder surgery 2011  . Total abdominal hysterectomy 01/2011  . Bilateral oophorectomy 01/2011    ovarian cyst benign    Prior to Admission medications   Medication Sig Start Date End Date Taking? Authorizing Provider  aspirin EC 81 MG tablet Take 81 mg by mouth daily.   Yes Historical Provider, MD  carvedilol (COREG) 3.125 MG tablet 1  tab in morning and 2 tabs in the evening 09/02/12  Yes Jolaine Artist, MD  cyclobenzaprine (FLEXERIL) 10 MG tablet Take 10 mg by mouth at bedtime as needed. For pain/sleep 06/05/12  Yes Wardell Honour, MD  digoxin (LANOXIN) 0.125 MG tablet Take 1 tablet (0.125 mg total) by mouth daily. 08/17/12  Yes Jolaine Artist, MD  gabapentin (NEURONTIN) 600 MG tablet Take 1 tablet (600 mg total) by mouth 3 (three) times daily. 06/05/12  Yes Wardell Honour, MD  glimepiride (AMARYL) 4 MG tablet Take 1 tablet (4  mg total) by mouth 2 (two) times daily before a meal. 06/05/12  Yes Wardell Honour, MD  lisinopril (PRINIVIL,ZESTRIL) 5 MG tablet Take 1 tablet (5 mg total) by mouth 2 (two) times daily. 08/17/12  Yes Jolaine Artist, MD  metFORMIN (GLUCOPHAGE) 1000 MG tablet Take 1 tablet (1,000 mg total) by mouth 2 (two) times daily with a meal. 06/05/12  Yes Wardell Honour, MD  nitroGLYCERIN (NITROSTAT) 0.4 MG SL tablet Place 1 tablet (0.4 mg total) under the tongue every 5 (five) minutes as needed for chest pain. 07/26/12  Yes Coral Spikes, DO  nortriptyline (PAMELOR) 50 MG capsule Take 1 capsule (50 mg total) by mouth at bedtime. 06/05/12  Yes Wardell Honour, MD  pravastatin (PRAVACHOL) 20 MG tablet Take 1 tablet (20 mg total) by mouth at bedtime. 08/17/12  Yes Jolaine Artist, MD  Ticagrelor (BRILINTA) 90 MG TABS tablet Take 1 tablet (90 mg total) by mouth 2 (two) times daily. 08/17/12  Yes Jolaine Artist, MD  traMADol (ULTRAM) 50 MG tablet Take 50 mg by mouth every 6 (six) hours as needed. For pain 06/05/12  Yes Wardell Honour, MD    No Known Allergies  History   Social History  . Marital Status: Married    Spouse Name: N/A    Number of Children: 2  . Years of Education: 12   Occupational History  . disabled     03/2010 for peripheral neuropathy  . home daycare     x 20 yrs.   Social History Main Topics  . Smoking status: Never Smoker   . Smokeless tobacco: Not on file  . Alcohol Use: No  . Drug Use: No  . Sexually Active: Not on file   Other Topics Concern  . Not on file   Social History Narrative   Married x 38 yrs. Happily married, no abuse. Has 2 children: daughter 58, son 92. One grandson and 2 step grandchildren. Daughter and grandson live in home with pt and spouse. Pets: dog. Exercise: Inactive.Always uses seat belts, smoke detectors in home.No guns in the home. Caffeine use: 2 cups coffee per day. Nutrition: Well balanced diet.    Family History  Problem Relation Age of  Onset  . Diabetes Mother   . Cancer Father     prostate,colon,skin,lymph node  . Obesity Brother        Objective:   Physical Exam  Nursing note and vitals reviewed. Constitutional: She is oriented to person, place, and time. She appears well-developed and well-nourished. No distress.  Eyes: Conjunctivae normal are normal. Pupils are equal, round, and reactive to light.  Neck: Normal range of motion. Neck supple. No JVD present. No thyromegaly present.  Cardiovascular: Normal rate, regular rhythm, normal heart sounds and intact distal pulses.  Exam reveals no gallop and no friction rub.   No murmur heard. Pulmonary/Chest: Effort normal and breath sounds normal. No  respiratory distress. She has no wheezes. She has no rales.  Abdominal: Soft. Bowel sounds are normal. There is no tenderness. There is no rebound and no guarding.  Lymphadenopathy:    She has no cervical adenopathy.  Neurological: She is alert and oriented to person, place, and time.  Skin: Skin is warm and dry. No rash noted. She is not diaphoretic.  Psychiatric: She has a normal mood and affect. Her behavior is normal. Judgment and thought content normal.       Assessment & Plan:   1. Type II or unspecified type diabetes mellitus without mention of complication, not stated as uncontrolled  Comprehensive metabolic panel, Hemoglobin A1c  2. Pure hypercholesterolemia  CK, CBC with Differential, Lipid panel  3. Systolic CHF    4. Coronary artery disease    5. Hyperkalemia

## 2012-09-08 ENCOUNTER — Encounter (HOSPITAL_COMMUNITY): Payer: BC Managed Care – PPO

## 2012-09-08 ENCOUNTER — Ambulatory Visit (HOSPITAL_COMMUNITY)
Admission: RE | Admit: 2012-09-08 | Discharge: 2012-09-08 | Disposition: A | Discharge status: Home / Self Care | Payer: Medicare Other | Source: Ambulatory Visit | Attending: Internal Medicine | Admitting: Internal Medicine

## 2012-09-08 ENCOUNTER — Other Ambulatory Visit: Payer: Self-pay | Admitting: Radiology

## 2012-09-08 VITALS — BP 130/72 | HR 86 | Wt 270.2 lb

## 2012-09-08 DIAGNOSIS — I5022 Chronic systolic (congestive) heart failure: Secondary | ICD-10-CM | POA: Insufficient documentation

## 2012-09-08 DIAGNOSIS — I2510 Atherosclerotic heart disease of native coronary artery without angina pectoris: Secondary | ICD-10-CM | POA: Insufficient documentation

## 2012-09-08 LAB — IRON AND TIBC
%SAT: 16 % — ABNORMAL LOW (ref 20–55)
Iron: 52 ug/dL (ref 42–145)

## 2012-09-08 LAB — VITAMIN B12: Vitamin B-12: 332 pg/mL (ref 211–911)

## 2012-09-08 MED ORDER — ISOSORBIDE MONONITRATE ER 30 MG PO TB24: 30.0000 mg | ORAL_TABLET | Freq: Every day | ORAL | Status: DC

## 2012-09-08 MED ORDER — TRAMADOL HCL 50 MG PO TABS: 50.0000 mg | ORAL_TABLET | Freq: Four times a day (QID) | ORAL | Status: DC | PRN

## 2012-09-08 MED ORDER — GLIMEPIRIDE 4 MG PO TABS: 4.0000 mg | ORAL_TABLET | Freq: Two times a day (BID) | ORAL | Status: DC

## 2012-09-08 MED ORDER — NORTRIPTYLINE HCL 50 MG PO CAPS: 50.0000 mg | ORAL_CAPSULE | Freq: Every day | ORAL | Status: DC

## 2012-09-08 MED ORDER — GABAPENTIN 600 MG PO TABS: 600.0000 mg | ORAL_TABLET | Freq: Three times a day (TID) | ORAL | Status: DC

## 2012-09-08 MED ORDER — CYCLOBENZAPRINE HCL 10 MG PO TABS: 10.0000 mg | ORAL_TABLET | Freq: Every evening | ORAL | Status: DC | PRN

## 2012-09-08 MED ORDER — HYDRALAZINE HCL 25 MG PO TABS: 25.0000 mg | ORAL_TABLET | Freq: Three times a day (TID) | ORAL | Status: DC

## 2012-09-08 MED ORDER — METFORMIN HCL 1000 MG PO TABS: 1000.0000 mg | ORAL_TABLET | Freq: Two times a day (BID) | ORAL | Status: DC

## 2012-09-08 NOTE — Assessment & Plan Note (Signed)
No ischemic symptoms.  Continue Brilinta and ASA for at least 1 year.

## 2012-09-08 NOTE — Patient Instructions (Addendum)
Add hydralazine 12.5 (0.5 tab) three times daily and imdur 30 mg daily.    Take torsemide today.  Would like weight 264 pounds and can take torsemide + potassium if weight greater than this.   Follow up 3 weeks.

## 2012-09-08 NOTE — Telephone Encounter (Signed)
Patient requests renewals on additional meds. Tramadol and Flexeril and Nortriptyline please advise, her other meds were resubmitted she wanted them to go to mail order, not [employer]. They were CXl at [place] resubmited to mail order. I pended them.

## 2012-09-08 NOTE — Progress Notes (Signed)
Weight Range   275 pounds  Baseline proBNP   5000.6    HPI: [name] is a 56 y.o. female with past medical history of HL, morbid obesity, chronic pain and DM since she was 56 years old. She had neuropathy in 2011 causing her to be in a wheelchair, this recovered after 8 months of therapy.   Admitted to [facility] with progressive dyspnea and lower extremity edema.  Found to have new onset systolic HF.  ProBNP 5000.  Troponin 0.64. EKG LBBB.  She diuresed well on IV lasix.  Underwent RHC/LHC on 07/21/12 showing:   RA = 7  RV = 39/5/8  PA = 43/20 (31)  PCW = 17  Fick cardiac output/index = 5.4/2.3  PVR = 2.5 Woods  FA sat = 90%  PA sat = 58%, 57%  Ao Pressure: 118/77 (95)  LV Pressure: 116/21//2  Ao Pressure: 118/77 (95)  LV Pressure: 116/21//2  Left main: Long. Mild plaque  LAD: Completely occluded proximally after first diagonal. 1st diagonal 30-40% ostial. 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 Ostial 80%  RAMUS: Moderate sized vessel. Diffuse 30%  RCA: Very large dominant vessel. Diffuse 40% in midsection.. 30% around distal bend. PDA moderate sized vessel subtotally occluded. 2 large PLs with mild plaque. Very fain collateral to LAD.  LV-gram done in the RAO projection: Ejection fraction = 15% Distal 2/3 of ventricle is akinetic   After long discussion she underwent complex PTCA and stenting of LAD (chronic total occlusion) on 10/17 by Dr. Burt Knack.  She was placed on Brilinta and ASA for 1 year.  Possible PCI of diag in ~4 weeks depending clinical course.  Discharged on 10/18 at 275 pounds.  She returns for follow up today.  She feels a little more SOB last night and today.  She has not taken torsemide because weight has remained stable at 265 pound.  She denies dizziness, lower extremity edema.  Did not sleep well last night but denies PND.  No chest pain.  No bleeding.  Since follow up she had  hyperkalemia and her spiro/potassium was stopped.  Repeat lab showed hyperkalemia and lisinopril stopped with repeat K ok.      ROS: All systems negative except as listed in HPI, PMH and Problem List.  Past Medical History  Diagnosis Date  . Pure hypercholesterolemia   . Obesity, unspecified   . Unspecified vitamin D deficiency   . Pure hypercholesterolemia   . Type II or unspecified type diabetes mellitus without mention of complication, not stated as uncontrolled   . Elevated blood pressure reading without diagnosis of hypertension   . Neuropathy 2011  . Chicken pox   . Coronary artery disease   . CHF (congestive heart failure) 07/19/2012    EF 15% by cardiac catheterization.  Admission.  . Myocardial infarct 07/19/12    s/p cardiac catheterization with stenting to LAD.    Current Outpatient Prescriptions  Medication Sig Dispense Refill  . aspirin EC 81 MG tablet Take 81 mg by mouth daily.      . carvedilol (COREG) 3.125 MG tablet 1 tab in morning and 2 tabs in the evening  270 tablet  3  . cyclobenzaprine (FLEXERIL) 10 MG tablet Take 1 tablet (10 mg total) by mouth at bedtime as needed. For pain/sleep  90 tablet  1  . digoxin (LANOXIN) 0.125 MG tablet Take 1 tablet (0.125 mg total) by mouth daily.  90 tablet  3  . gabapentin (NEURONTIN) 600 MG tablet Take 1 tablet (600 mg total) by mouth 3 (three) times daily.  90 tablet  5  . glimepiride (AMARYL) 4 MG tablet Take 1 tablet (4 mg total) by mouth 2 (two) times daily before a meal.  60 tablet  11  . metFORMIN (GLUCOPHAGE) 1000 MG tablet Take 1 tablet (1,000 mg total) by mouth 2 (two) times daily with a meal.  60 tablet  11  . nitroGLYCERIN (NITROSTAT) 0.4 MG SL tablet Place 1 tablet (0.4 mg total) under the tongue every 5 (five) minutes as needed for chest pain.  30 tablet  0  . nortriptyline (PAMELOR) 50 MG capsule Take 1 capsule (50 mg total) by mouth at bedtime.  90 capsule  3  . potassium chloride SA (K-DUR,KLOR-CON) 20 MEQ  tablet Take 20 mEq by mouth daily as needed. Take only when take Torsemide      . pravastatin (PRAVACHOL) 20 MG tablet Take 1 tablet (20 mg total) by mouth at bedtime.  90 tablet  3  . Ticagrelor (BRILINTA) 90 MG TABS tablet Take 1 tablet (90 mg total) by mouth 2 (two) times daily.  180 tablet  3  . torsemide (DEMADEX) 20 MG tablet Take 20 mg by mouth daily as needed.      . traMADol (ULTRAM) 50 MG tablet Take 1 tablet (50 mg total) by mouth every 6 (six) hours as needed. For pain  90 tablet  1  . [DISCONTINUED] gabapentin (NEURONTIN) 600 MG tablet Take 1 tablet (600 mg total) by mouth 3 (three) times daily.  90 tablet  5  . [DISCONTINUED] glimepiride (AMARYL) 4 MG tablet Take 1 tablet (4 mg total) by mouth 2 (two) times daily before a meal.  60 tablet  11  . [DISCONTINUED] metFORMIN (GLUCOPHAGE) 1000 MG tablet Take 1 tablet (1,000 mg total) by mouth 2 (two) times daily with a meal.  60 tablet  11  . [DISCONTINUED] nortriptyline (PAMELOR) 50 MG capsule Take 1 capsule (50 mg total) by mouth at bedtime.  30 capsule  5     PHYSICAL EXAM: Filed Vitals:   09/08/12 1406  BP: 130/72  Pulse: 86  Weight: 270 lb 4 oz (122.585 kg)  SpO2: 98%    General: Obese, Well appearing. No resp difficulty  HEENT: normal  Neck: supple. JVP 7. Carotids 2+ bilat; no bruits. No lymphadenopathy or thryomegaly appreciated.  Cor: PMI nondisplaced. Regular rate & rhythm. Split S2. No S3.  Lungs: clear  Abdomen: obese, soft, nontender, nondistended. No hepatosplenomegaly. No bruits or masses. Good bowel sounds.  Extremities: no cyanosis, clubbing, rash, trace edema  Neuro: alert & orientedx3, cranial nerves grossly intact. moves all 4 extremities w/o difficulty. Affect pleasant    ASSESSMENT & PLAN:

## 2012-09-08 NOTE — Assessment & Plan Note (Signed)
Volume status mildly elevated.  She will take torsemide today and for weight 265 or greater.  She has been unable to tolerate spiro or lisinopril due to hyperkalemia.  Hyperkalemia has since improved.  Will add hydralazine/nitrate for afterload reduction.  Follow up 3 weeks to reassess volume and titrate meds further.

## 2012-09-12 NOTE — Progress Notes (Signed)
Reviewed and agree.

## 2012-09-29 ENCOUNTER — Ambulatory Visit (HOSPITAL_COMMUNITY)
Admission: RE | Admit: 2012-09-29 | Discharge: 2012-09-29 | Disposition: A | Discharge status: Home / Self Care | Payer: Medicare Other | Source: Ambulatory Visit | Attending: Internal Medicine | Admitting: Internal Medicine

## 2012-09-29 VITALS — BP 138/72 | HR 80 | Wt 270.0 lb

## 2012-09-29 DIAGNOSIS — I509 Heart failure, unspecified: Secondary | ICD-10-CM | POA: Insufficient documentation

## 2012-09-29 DIAGNOSIS — E1149 Type 2 diabetes mellitus with other diabetic neurological complication: Secondary | ICD-10-CM | POA: Insufficient documentation

## 2012-09-29 DIAGNOSIS — I5020 Unspecified systolic (congestive) heart failure: Secondary | ICD-10-CM | POA: Insufficient documentation

## 2012-09-29 DIAGNOSIS — I5022 Chronic systolic (congestive) heart failure: Secondary | ICD-10-CM

## 2012-09-29 DIAGNOSIS — E1142 Type 2 diabetes mellitus with diabetic polyneuropathy: Secondary | ICD-10-CM | POA: Insufficient documentation

## 2012-09-29 DIAGNOSIS — I252 Old myocardial infarction: Secondary | ICD-10-CM | POA: Insufficient documentation

## 2012-09-29 DIAGNOSIS — Z9861 Coronary angioplasty status: Secondary | ICD-10-CM | POA: Insufficient documentation

## 2012-09-29 DIAGNOSIS — E7800 Pure hypercholesterolemia, unspecified: Secondary | ICD-10-CM | POA: Insufficient documentation

## 2012-09-29 DIAGNOSIS — I2510 Atherosclerotic heart disease of native coronary artery without angina pectoris: Secondary | ICD-10-CM

## 2012-09-29 MED ORDER — CARVEDILOL 3.125 MG PO TABS: 6.2500 mg | ORAL_TABLET | Freq: Two times a day (BID) | ORAL | Status: DC

## 2012-09-29 NOTE — Assessment & Plan Note (Addendum)
No ischemic symptoms.  Will continue Brilinta (can switch to Plavix after 6 months to reduce cost) and ASA.  Clinically doing so well at this time will hold off on diag intervention.  Repeat echo.   Attending: Agree with above. Would prefer to treat diagonal medically. I will review films to look at territory it covers. Await results of echo.

## 2012-09-29 NOTE — Progress Notes (Signed)
Weight Range   275 pounds  Baseline proBNP   5000.6    HPI: [name] is a 56 y.o. female with past medical history of HL, morbid obesity, chronic pain and DM since she was 56 years old. She had neuropathy in 2011 causing her to be in a wheelchair, this recovered after 8 months of therapy.   Admitted to [facility] with progressive dyspnea and lower extremity edema.  Found to have new onset systolic HF.  ProBNP 5000.  Troponin 0.64. EKG LBBB.  She diuresed well on IV lasix.  Underwent RHC/LHC on 07/21/12 showing:   RA = 7  RV = 39/5/8  PA = 43/20 (31)  PCW = 17  Fick cardiac output/index = 5.4/2.3  PVR = 2.5 Woods  FA sat = 90%  PA sat = 58%, 57%  Ao Pressure: 118/77 (95)  LV Pressure: 116/21//2  Ao Pressure: 118/77 (95)  LV Pressure: 116/21//2  Left main: Long. Mild plaque  LAD: Completely occluded proximally after first diagonal. 1st diagonal 30-40% ostial. 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 Ostial 80%  RAMUS: Moderate sized vessel. Diffuse 30%  RCA: Very large dominant vessel. Diffuse 40% in midsection.. 30% around distal bend. PDA moderate sized vessel subtotally occluded. 2 large PLs with mild plaque. Very fain collateral to LAD.  LV-gram done in the RAO projection: Ejection fraction = 15% Distal 2/3 of ventricle is akinetic   After long discussion she underwent complex PTCA and stenting of LAD (chronic total occlusion) on 10/17 by Dr. Burt Knack.  She was placed on Brilinta and ASA for 1 year.  Possible PCI of diag in ~4 weeks depending clinical course.  Discharged on 10/18 at 275 pounds.    She returns for follow up today.  Last visit hydralazine/nitrates added for afterload reduction as she had hyperkalemia with lisinopril.  Doing well.  Has taken 3 fluid pills since last visit.  Denies edema, orhtopnea or PND.  Walking up stairs and going to store without difficulty.  Cooking for the  holidays.        ROS: All systems negative except as listed in HPI, PMH and Problem List.  Past Medical History  Diagnosis Date  . Pure hypercholesterolemia   . Obesity, unspecified   . Unspecified vitamin D deficiency   . Pure hypercholesterolemia   . Type II or unspecified type diabetes mellitus without mention of complication, not stated as uncontrolled   . Elevated blood pressure reading without diagnosis of hypertension   . Neuropathy 2011  . Chicken pox   . Coronary artery disease   . CHF (congestive heart failure) 07/19/2012    EF 15% by cardiac catheterization.  Admission.  . Myocardial infarct 07/19/12    s/p cardiac catheterization with stenting to LAD.    Current Outpatient Prescriptions  Medication Sig Dispense Refill  . aspirin EC 81 MG tablet Take 81 mg by mouth daily.      . carvedilol (COREG) 3.125 MG tablet 1 tab in morning and 2 tabs in the evening  270 tablet  3  . cyclobenzaprine (FLEXERIL) 10 MG tablet Take 1 tablet (10 mg total) by mouth at bedtime as needed. For pain/sleep  90 tablet  1  . digoxin (LANOXIN) 0.125 MG tablet Take 1 tablet (0.125 mg total) by mouth daily.  90 tablet  3  . gabapentin (NEURONTIN) 600 MG tablet Take 1 tablet (600 mg total) by mouth 3 (three) times daily.  90 tablet  5  . glimepiride (AMARYL) 4 MG tablet Take 1 tablet (4 mg total) by mouth 2 (two) times daily before a meal.  60 tablet  11  . hydrALAZINE (APRESOLINE) 25 MG tablet Take 1 tablet (25 mg total) by mouth 3 (three) times daily.  90 tablet  6  . isosorbide mononitrate (IMDUR) 30 MG 24 hr tablet Take 1 tablet (30 mg total) by mouth daily.  30 tablet  6  . metFORMIN (GLUCOPHAGE) 1000 MG tablet Take 1 tablet (1,000 mg total) by mouth 2 (two) times daily with a meal.  60 tablet  11  . nitroGLYCERIN (NITROSTAT) 0.4 MG SL tablet Place 1 tablet (0.4 mg total) under the tongue every 5 (five) minutes as needed for chest pain.  30 tablet  0  . nortriptyline (PAMELOR) 50 MG capsule  Take 1 capsule (50 mg total) by mouth at bedtime.  90 capsule  3  . potassium chloride SA (K-DUR,KLOR-CON) 20 MEQ tablet Take 20 mEq by mouth daily as needed. Take only when take Torsemide      . pravastatin (PRAVACHOL) 20 MG tablet Take 1 tablet (20 mg total) by mouth at bedtime.  90 tablet  3  . Ticagrelor (BRILINTA) 90 MG TABS tablet Take 1 tablet (90 mg total) by mouth 2 (two) times daily.  180 tablet  3  . torsemide (DEMADEX) 20 MG tablet Take 20 mg by mouth daily as needed.      . traMADol (ULTRAM) 50 MG tablet Take 1 tablet (50 mg total) by mouth every 6 (six) hours as needed. For pain  90 tablet  1     PHYSICAL EXAM: Filed Vitals:   09/29/12 0950  BP: 138/72  Pulse: 80  Weight: 270 lb (122.471 kg)  SpO2: 98%    General: Obese, Well appearing. No resp difficulty  HEENT: normal  Neck: supple. JVP 7. Carotids 2+ bilat; no bruits. No lymphadenopathy or thryomegaly appreciated.  Cor: PMI nondisplaced. Regular rate & rhythm. Split S2. No S3.  Lungs: clear  Abdomen: obese, soft, nontender, nondistended. No hepatosplenomegaly. No bruits or masses. Good bowel sounds.  Extremities: no cyanosis, clubbing, rash, trace edema  Neuro: alert & orientedx3, cranial nerves grossly intact. moves all 4 extremities w/o difficulty. Affect pleasant    ASSESSMENT & PLAN:

## 2012-09-29 NOTE — Assessment & Plan Note (Addendum)
NYHA II.  Increase carvedilol 6.25 mg BID.  Continue sliding scale lasix.  Follow up 1 month with repeat echo to evaluate LV Dysfunction.  Patient seen and examined with Leone Haven, PA-C. We discussed all aspects of the encounter. I agree with the assessment and plan as stated above. She is much improved. Volume status looks good. Continue to titrate meds. Echo at next visit. Will need to decide about ICD.

## 2012-09-29 NOTE — Patient Instructions (Addendum)
Increase carvedilol 6.25 mg (2 tabs) twice daily  Follow up 1 month with echo.

## 2012-10-29 ENCOUNTER — Ambulatory Visit (HOSPITAL_COMMUNITY)
Admission: RE | Admit: 2012-10-29 | Discharge: 2012-10-29 | Disposition: A | Discharge status: Home / Self Care | Payer: Medicare Other | Source: Ambulatory Visit | Attending: Internal Medicine | Admitting: Internal Medicine

## 2012-10-29 ENCOUNTER — Encounter (HOSPITAL_COMMUNITY): Payer: Self-pay

## 2012-10-29 ENCOUNTER — Ambulatory Visit (HOSPITAL_BASED_OUTPATIENT_CLINIC_OR_DEPARTMENT_OTHER)
Admission: RE | Admit: 2012-10-29 | Discharge: 2012-10-29 | Disposition: A | Discharge status: Home / Self Care | Payer: Medicare Other | Source: Ambulatory Visit | Attending: Pediatrics | Admitting: Pediatrics

## 2012-10-29 VITALS — BP 140/66 | HR 88 | Wt 276.0 lb

## 2012-10-29 DIAGNOSIS — I2510 Atherosclerotic heart disease of native coronary artery without angina pectoris: Secondary | ICD-10-CM | POA: Insufficient documentation

## 2012-10-29 DIAGNOSIS — E785 Hyperlipidemia, unspecified: Secondary | ICD-10-CM | POA: Insufficient documentation

## 2012-10-29 DIAGNOSIS — E119 Type 2 diabetes mellitus without complications: Secondary | ICD-10-CM | POA: Insufficient documentation

## 2012-10-29 DIAGNOSIS — I509 Heart failure, unspecified: Secondary | ICD-10-CM | POA: Insufficient documentation

## 2012-10-29 DIAGNOSIS — I059 Rheumatic mitral valve disease, unspecified: Secondary | ICD-10-CM

## 2012-10-29 DIAGNOSIS — I5022 Chronic systolic (congestive) heart failure: Secondary | ICD-10-CM

## 2012-10-29 MED ORDER — CARVEDILOL 12.5 MG PO TABS: 12.5000 mg | ORAL_TABLET | Freq: Two times a day (BID) | ORAL | Status: DC

## 2012-10-29 NOTE — Progress Notes (Signed)
  Echocardiogram 2D Echocardiogram has been performed.  Ferne Ellingwood 10/29/2012, 2:40 PM

## 2012-10-29 NOTE — Patient Instructions (Addendum)
Take Carvedilol 12.5 mg twice a day  Do the following things EVERYDAY: 1) Weigh yourself in the morning before breakfast. Write it down and keep it in a log. 2) Take your medicines as prescribed 3) Eat low salt foods-Limit salt (sodium) to 2000 mg per day.  4) Stay as active as you can everyday 5) Limit all fluids for the day to less than 2 liters  Follow up in 6-8 weeks.

## 2012-10-29 NOTE — Progress Notes (Signed)
Patient ID: [name], female   DOB: [birth year], 57 y.o.   MRN: [id number]  Weight Range   275 pounds  Baseline proBNP   5000.6    HPI: [name] is a 57 y.o. female with past medical history of HL, morbid obesity, chronic pain and DM since she was 57 years old. She had neuropathy in 2011 causing her to be in a wheelchair, this recovered after 8 months of therapy.   Admitted to [facility] with progressive dyspnea and lower extremity edema.  Found to have new onset systolic HF.  ProBNP 5000.  Troponin 0.64. EKG LBBB.  She diuresed well on IV lasix.    Underwent RHC/LHC on 07/21/12 showing:  RA = 7  RV = 39/5/8  PA = 43/20 (31)  PCW = 17  Fick cardiac output/index = 5.4/2.3  PVR = 2.5 Woods  FA sat = 90%  PA sat = 58%, 57%  Ao Pressure: 118/77 (95)  LV Pressure: 116/21//2  Ao Pressure: 118/77 (95)  LV Pressure: 116/21//2  Left main: Long. Mild plaque  LAD: Completely occluded proximally after first diagonal. 1st diagonal 30-40% ostial. 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 Ostial 80%  RAMUS: Moderate sized vessel. Diffuse 30%  RCA: Very large dominant vessel. Diffuse 40% in midsection.. 30% around distal bend. PDA moderate sized vessel subtotally occluded. 2 large PLs with mild plaque. Very fain collateral to LAD.  LV-gram done in the RAO projection: Ejection fraction = 15% Distal 2/3 of ventricle is akinetic   S/P complex PTCA and stenting of LAD (chronic total occlusion) on 10/17 by Dr. Burt Knack.  She was placed on Brilinta and ASA for 1 year.  Possible PCI of diag in ~4 weeks depending clinical course.  Discharged on 10/18 at 275 pounds.   ECHO 10/29/12 EF 35-40%   She returns for follow up today. She is not on an ace inhibitor due to hyperkalemia.  Last visit carvedilol increased to 6.25 mg twice a day. Denies SOB/PND/Orthopnea/CP. Compliant with medications. Weight at home 260-264 pounds.  She has required one extra lasix for weight gain up to 264 pounds. Liberalized diet over the holidays.  Follows low salt diet. Not exercising. Unable to start cardiac rehab due to deductible.    ROS: All systems negative except as listed in HPI, PMH and Problem List.  Past Medical History  Diagnosis Date  . Pure hypercholesterolemia   . Obesity, unspecified   . Unspecified vitamin D deficiency   . Pure hypercholesterolemia   . Type II or unspecified type diabetes mellitus without mention of complication, not stated as uncontrolled   . Elevated blood pressure reading without diagnosis of hypertension   . Neuropathy 2011  . Chicken pox   . Coronary artery disease   . CHF (congestive heart failure) 07/19/2012    EF 15% by cardiac catheterization.  Admission.  . Myocardial infarct 07/19/12    s/p cardiac catheterization with stenting to LAD.    Current Outpatient Prescriptions  Medication Sig Dispense Refill  . aspirin EC 81 MG tablet Take 81 mg by mouth daily.      . carvedilol (COREG) 3.125 MG tablet Take 2 tablets (6.25 mg total) by mouth 2 (two) times daily with a meal. 1 tab in morning and 2 tabs in the evening  270 tablet  3  . cyclobenzaprine (FLEXERIL) 10 MG tablet Take 1 tablet (10 mg total) by mouth at bedtime as needed. For  pain/sleep  90 tablet  1  . digoxin (LANOXIN) 0.125 MG tablet Take 1 tablet (0.125 mg total) by mouth daily.  90 tablet  3  . gabapentin (NEURONTIN) 600 MG tablet Take 1 tablet (600 mg total) by mouth 3 (three) times daily.  90 tablet  5  . glimepiride (AMARYL) 4 MG tablet Take 1 tablet (4 mg total) by mouth 2 (two) times daily before a meal.  60 tablet  11  . hydrALAZINE (APRESOLINE) 25 MG tablet Take 1 tablet (25 mg total) by mouth 3 (three) times daily.  90 tablet  6  . isosorbide mononitrate (IMDUR) 30 MG 24 hr tablet Take 1 tablet (30 mg total) by mouth daily.  30 tablet  6  . metFORMIN (GLUCOPHAGE) 1000 MG tablet Take 1 tablet (1,000 mg total) by mouth  2 (two) times daily with a meal.  60 tablet  11  . nitroGLYCERIN (NITROSTAT) 0.4 MG SL tablet Place 1 tablet (0.4 mg total) under the tongue every 5 (five) minutes as needed for chest pain.  30 tablet  0  . nortriptyline (PAMELOR) 50 MG capsule Take 1 capsule (50 mg total) by mouth at bedtime.  90 capsule  3  . potassium chloride SA (K-DUR,KLOR-CON) 20 MEQ tablet Take 20 mEq by mouth daily as needed. Take only when take Torsemide      . pravastatin (PRAVACHOL) 20 MG tablet Take 1 tablet (20 mg total) by mouth at bedtime.  90 tablet  3  . Ticagrelor (BRILINTA) 90 MG TABS tablet Take 1 tablet (90 mg total) by mouth 2 (two) times daily.  180 tablet  3  . torsemide (DEMADEX) 20 MG tablet Take 20 mg by mouth daily as needed.      . traMADol (ULTRAM) 50 MG tablet Take 1 tablet (50 mg total) by mouth every 6 (six) hours as needed. For pain  90 tablet  1     PHYSICAL EXAM: Filed Vitals:   10/29/12 1451  BP: 140/66  Pulse: 88  Weight: 276 lb (125.193 kg)  SpO2: 98%   Weight 270>276 pounds General: Obese, Well appearing. No resp difficulty  HEENT: normal  Neck: supple. JVP 7. Carotids 2+ bilat; no bruits. No lymphadenopathy or thryomegaly appreciated.  Cor: PMI nondisplaced. Regular rate & rhythm. Split S2. No S3.  Lungs: clear  Abdomen: obese, soft, nontender, nondistended. No hepatosplenomegaly. No bruits or masses. Good bowel sounds.  Extremities: no cyanosis, clubbing, rash, trace edema  Neuro: alert & orientedx3, cranial nerves grossly intact. moves all 4 extremities w/o difficulty. Affect pleasant    ASSESSMENT & PLAN:

## 2012-10-29 NOTE — Assessment & Plan Note (Addendum)
NYHA II. Volume status stable. Dr Haroldine Laws reviewed and discussed ECHO. EF improved to  35-40% from 15%. Increase carvedilol to 12.5 mg twice a day. Follow up in  6 weeks.    Patient seen and examined with Darrick Grinder, NP. We discussed all aspects of the encounter. I agree with the assessment and plan as stated above.  I discussed echo results with her. She is doing very well. Continue to titrate b-blocker. No need for ICD with EF > 35%.

## 2012-10-29 NOTE — Assessment & Plan Note (Addendum)
No ischemic symptoms.  Will continue Brilinta (can switch to Plavix after 6 months (April 2014) to reduce cost) and ASA.     Attending: Agree.

## 2012-11-17 ENCOUNTER — Other Ambulatory Visit: Payer: Self-pay | Admitting: Family Medicine

## 2012-11-17 DIAGNOSIS — Z1231 Encounter for screening mammogram for malignant neoplasm of breast: Secondary | ICD-10-CM

## 2012-12-07 ENCOUNTER — Ambulatory Visit (INDEPENDENT_AMBULATORY_CARE_PROVIDER_SITE_OTHER): Payer: Medicare Other | Admitting: Family Medicine

## 2012-12-07 ENCOUNTER — Encounter: Payer: Self-pay | Admitting: Family Medicine

## 2012-12-07 VITALS — BP 108/58 | HR 74 | Temp 96.8°F | Resp 16 | Ht 68.0 in | Wt 260.0 lb

## 2012-12-07 DIAGNOSIS — E1142 Type 2 diabetes mellitus with diabetic polyneuropathy: Secondary | ICD-10-CM

## 2012-12-07 DIAGNOSIS — E7800 Pure hypercholesterolemia, unspecified: Secondary | ICD-10-CM | POA: Insufficient documentation

## 2012-12-07 DIAGNOSIS — E119 Type 2 diabetes mellitus without complications: Secondary | ICD-10-CM

## 2012-12-07 DIAGNOSIS — D509 Iron deficiency anemia, unspecified: Secondary | ICD-10-CM

## 2012-12-07 DIAGNOSIS — I509 Heart failure, unspecified: Secondary | ICD-10-CM

## 2012-12-07 LAB — LIPID PANEL
HDL: 27 mg/dL — ABNORMAL LOW (ref >39)
LDL Cholesterol: 49 mg/dL (ref 0–99)
Total CHOL/HDL Ratio: 5 Ratio
VLDL: 60 mg/dL — ABNORMAL HIGH (ref 0–40)

## 2012-12-07 LAB — FERRITIN: Ferritin: 25 ng/mL (ref 10–291)

## 2012-12-07 LAB — COMPREHENSIVE METABOLIC PANEL
ALT: 10 U/L (ref 0–35)
AST: 13 U/L (ref 0–37)
Alkaline Phosphatase: 100 U/L (ref 39–117)
CO2: 28 mEq/L (ref 19–32)
Sodium: 140 mEq/L (ref 135–145)
Total Bilirubin: 0.4 mg/dL (ref 0.3–1.2)
Total Protein: 7 g/dL (ref 6.0–8.3)

## 2012-12-07 LAB — CBC WITH DIFFERENTIAL/PLATELET
Basophils Absolute: 0 10*3/uL (ref 0.0–0.1)
Basophils Relative: 0 % (ref 0–1)
Hemoglobin: 11.1 g/dL — ABNORMAL LOW (ref 12.0–15.0)
MCHC: 33 g/dL (ref 30.0–36.0)
Monocytes Relative: 6 % (ref 3–12)
Neutro Abs: 6 10*3/uL (ref 1.7–7.7)
Neutrophils Relative %: 72 % (ref 43–77)
RBC: 4.1 MIL/uL (ref 3.87–5.11)

## 2012-12-07 LAB — CK: Total CK: 69 U/L (ref 7–177)

## 2012-12-07 MED ORDER — TRAMADOL HCL 50 MG PO TABS: 50.0000 mg | ORAL_TABLET | Freq: Four times a day (QID) | ORAL | Status: DC | PRN

## 2012-12-07 MED ORDER — GABAPENTIN 600 MG PO TABS: 600.0000 mg | ORAL_TABLET | Freq: Three times a day (TID) | ORAL | Status: DC

## 2012-12-07 MED ORDER — PRAVASTATIN SODIUM 20 MG PO TABS: 20.0000 mg | ORAL_TABLET | Freq: Every day | ORAL | Status: DC

## 2012-12-07 MED ORDER — CYCLOBENZAPRINE HCL 10 MG PO TABS: 10.0000 mg | ORAL_TABLET | Freq: Every evening | ORAL | Status: DC | PRN

## 2012-12-07 MED ORDER — GLIMEPIRIDE 4 MG PO TABS: 4.0000 mg | ORAL_TABLET | Freq: Two times a day (BID) | ORAL | Status: DC

## 2012-12-07 MED ORDER — NORTRIPTYLINE HCL 50 MG PO CAPS: 50.0000 mg | ORAL_CAPSULE | Freq: Every day | ORAL | Status: DC

## 2012-12-07 MED ORDER — METFORMIN HCL 1000 MG PO TABS: 1000.0000 mg | ORAL_TABLET | Freq: Two times a day (BID) | ORAL | Status: DC

## 2012-12-07 NOTE — Assessment & Plan Note (Signed)
Stable; refills provided.  No changes to management.

## 2012-12-07 NOTE — Patient Instructions (Addendum)
Type II or unspecified type diabetes mellitus without mention of complication, not stated as uncontrolled - Plan: Comprehensive metabolic panel, Hemoglobin A1c  Pure hypercholesterolemia - Plan: CK, Lipid panel  Iron deficiency anemia, unspecified - Plan: CBC with Differential, Ferritin, Iron and TIBC  Acute CHF (congestive heart failure)  Acute systolic heart failure  CAD (coronary artery disease)  Diabetic peripheral neuropathy associated with type 2 diabetes mellitus

## 2012-12-07 NOTE — Progress Notes (Signed)
Subjective:    Patient ID: [name], female    DOB: [birth year], 57 y.o.   MRN: [id number]  HPI This 57 y.o. female presents for evaluation for three month follow-up:  1.  CHF systolic:  S/p 2D-echo in 10/2012 with EF to 35% which was improved from 15% during admission.  Increased Carvedilol dose in pat three months.  Trying to weigh daily; baseline weight at home 265.  To call cardiology if increases by 3-5 pounds; has not needed to call cardiology.  Has used diuretic three times since 07/2012.  Denies CP/palp/SOB/DOE/orthopnea/leg swelling.  2.  CAD:  No chest pain, palpitations, DOE, leg swelling. Cardiology decided not to undergo additional stenting at this time.    3.  DMII:  Three month follow-up; no changes to management made at last visit; last HgbA1c of 7.6.  Expects to be worse this time.  Has been baking with mother.  Checking sugars once weekly; running 125 fasting.  Highest reading 256.  Compliance with medications. Will sometimes sleep until 11:00am.    4.  Anemia iron deficiency: due for repeat today.  No bloody stools or melena.  Has frequent stools which is baseline.  No n/v/d/c; no abdominal pain.  Onset during hospitalization for new onset CHF, acute MI.    5 Peripheral neuropathy: stable at this time; no worsening pain or symptoms.    6.  Hypercholesterolemia; three month follow-up; no changes to management made at last visit; reports compliance with medication; good tolerance to medication; good symptom control.    Review of Systems  Constitutional: Negative for fever, chills, diaphoresis, activity change, appetite change and fatigue.  Respiratory: Negative for cough, shortness of breath, wheezing and stridor.   Cardiovascular: Negative for chest pain, palpitations and leg swelling.  Gastrointestinal: Negative for nausea, vomiting, abdominal pain, diarrhea, constipation, blood in stool, abdominal distention, anal bleeding and rectal pain.  Neurological: Positive  for numbness. Negative for dizziness, tremors, seizures, syncope, facial asymmetry, speech difficulty, weakness, light-headedness and headaches.  Hematological: Negative for adenopathy. Does not bruise/bleed easily.    Past Medical History  Diagnosis Date  . Pure hypercholesterolemia   . Obesity, unspecified   . Unspecified vitamin D deficiency   . Pure hypercholesterolemia   . Type II or unspecified type diabetes mellitus without mention of complication, not stated as uncontrolled   . Elevated blood pressure reading without diagnosis of hypertension   . Neuropathy 2011  . Chicken pox   . Coronary artery disease   . CHF (congestive heart failure) 07/19/2012    EF 15% by cardiac catheterization.  Admission.  . Myocardial infarct 07/19/12    s/p cardiac catheterization with stenting to LAD.  Marland Kitchen Hypertension     Past Surgical History  Procedure Laterality Date  . Lumbar back surgery  1996    L4-5  . Groin abscess  2007    I & D with ICU stay due to sepsis.  . Gallbladder surgery  2011  . Total abdominal hysterectomy  01/2011  . Bilateral oophorectomy  01/2011    ovarian cyst benign  . Admission  07/19/12    new onset systolic CHF; elevated troponins.  Cardiac catheterization with stenting to LAD; EF 15%.  2D-echo: EF 20-25%.  . Cholecystectomy    . Tubal ligation    . Cardiac catheterization Left     Prior to Admission medications   Medication Sig Start Date End Date Taking? Authorizing Provider  aspirin EC 81 MG tablet Take 81 mg by mouth  daily.   Yes Historical Provider, MD  carvedilol (COREG) 12.5 MG tablet Take 1 tablet (12.5 mg total) by mouth 2 (two) times daily with a meal. 10/29/12  Yes Amy D Clegg, NP  cyclobenzaprine (FLEXERIL) 10 MG tablet Take 1 tablet (10 mg total) by mouth at bedtime as needed. For pain/sleep 09/08/12  Yes Wardell Honour, MD  digoxin (LANOXIN) 0.125 MG tablet Take 1 tablet (0.125 mg total) by mouth daily. 08/17/12  Yes Jolaine Artist, MD   gabapentin (NEURONTIN) 600 MG tablet Take 1 tablet (600 mg total) by mouth 3 (three) times daily. 09/08/12  Yes Wardell Honour, MD  glimepiride (AMARYL) 4 MG tablet Take 1 tablet (4 mg total) by mouth 2 (two) times daily before a meal. 09/08/12  Yes Wardell Honour, MD  hydrALAZINE (APRESOLINE) 25 MG tablet Take 1 tablet (25 mg total) by mouth 3 (three) times daily. 09/08/12  Yes Wynetta Emery, PA  isosorbide mononitrate (IMDUR) 30 MG 24 hr tablet Take 1 tablet (30 mg total) by mouth daily. 09/08/12  Yes Wynetta Emery, PA  metFORMIN (GLUCOPHAGE) 1000 MG tablet Take 1 tablet (1,000 mg total) by mouth 2 (two) times daily with a meal. 09/08/12  Yes Wardell Honour, MD  nitroGLYCERIN (NITROSTAT) 0.4 MG SL tablet Place 1 tablet (0.4 mg total) under the tongue every 5 (five) minutes as needed for chest pain. 07/26/12  Yes Coral Spikes, DO  nortriptyline (PAMELOR) 50 MG capsule Take 1 capsule (50 mg total) by mouth at bedtime. 09/08/12  Yes Wardell Honour, MD  potassium chloride SA (K-DUR,KLOR-CON) 20 MEQ tablet Take 20 mEq by mouth daily as needed. Take only when take Torsemide   Yes Historical Provider, MD  pravastatin (PRAVACHOL) 20 MG tablet Take 1 tablet (20 mg total) by mouth at bedtime. 08/17/12  Yes Jolaine Artist, MD  Ticagrelor (BRILINTA) 90 MG TABS tablet Take 1 tablet (90 mg total) by mouth 2 (two) times daily. 08/17/12  Yes Jolaine Artist, MD  torsemide (DEMADEX) 20 MG tablet Take 20 mg by mouth daily as needed.   Yes Historical Provider, MD  traMADol (ULTRAM) 50 MG tablet Take 1 tablet (50 mg total) by mouth every 6 (six) hours as needed. For pain 09/08/12  Yes Wardell Honour, MD    No Known Allergies  History   Social History  . Marital Status: Married    Spouse Name: N/A    Number of Children: 2  . Years of Education: 12   Occupational History  . disabled     03/2010 for peripheral neuropathy  . home daycare     x 20 yrs.   Social History Main Topics  . Smoking status:  Never Smoker   . Smokeless tobacco: Not on file  . Alcohol Use: No  . Drug Use: No  . Sexually Active: Not on file   Other Topics Concern  . Not on file   Social History Narrative   Married x 38 yrs. Happily married, no abuse. Has 2 children: daughter 63, son 34. One grandson and 2 step grandchildren. Daughter and grandson live in home with pt and spouse. Pets: dog. Exercise: Inactive.Always uses seat belts, smoke detectors in home.No guns in the home. Caffeine use: 2 cups coffee per day. Nutrition: Well balanced diet.    Family History  Problem Relation Age of Onset  . Diabetes Mother   . Hypertension Mother   . Cancer Father     prostate,colon,skin,lymph  node  . Obesity Brother   . Diabetes Maternal Grandmother   . Diabetes Paternal Grandmother        Objective:   Physical Exam  Nursing note and vitals reviewed. Constitutional: She is oriented to person, place, and time. She appears well-developed and well-nourished. No distress.  HENT:  Mouth/Throat: Oropharynx is clear and moist.  Eyes: Conjunctivae are normal. Pupils are equal, round, and reactive to light.  Neck: Normal range of motion. Neck supple. No JVD present. No thyromegaly present.  Cardiovascular: Normal rate, regular rhythm and normal heart sounds.  Exam reveals no gallop and no friction rub.   No murmur heard. Pulmonary/Chest: Effort normal and breath sounds normal. No respiratory distress. She has no wheezes. She has no rales.  Lymphadenopathy:    She has no cervical adenopathy.  Neurological: She is alert and oriented to person, place, and time. No cranial nerve deficit. She exhibits normal muscle tone. Coordination normal.  Skin: She is not diaphoretic.  Psychiatric: She has a normal mood and affect. Her behavior is normal. Judgment and thought content normal.       Assessment & Plan:  Type II or unspecified type diabetes mellitus without mention of complication, not stated as uncontrolled - Plan:  Comprehensive metabolic panel, Hemoglobin A1c  Pure hypercholesterolemia - Plan: CK, Lipid panel  Iron deficiency anemia, unspecified - Plan: CBC with Differential, Ferritin, Iron and TIBC  Acute CHF (congestive heart failure)  Acute systolic heart failure  CAD (coronary artery disease)  Diabetic peripheral neuropathy associated with type 2 diabetes mellitus

## 2012-12-07 NOTE — Assessment & Plan Note (Signed)
Stable; asymptomatic; did not warrant additional stenting after AMI.

## 2012-12-07 NOTE — Assessment & Plan Note (Signed)
Controlled; obtain labs; continue current medications.

## 2012-12-07 NOTE — Assessment & Plan Note (Signed)
Uncontrolled; if HgbA1c remains> 7.0, will warrant addition of Januvia.

## 2012-12-07 NOTE — Assessment & Plan Note (Signed)
New.  Onset during admission for CHF, AMI.  Repeat today; asymptomatic.

## 2012-12-07 NOTE — Assessment & Plan Note (Signed)
Stable; recent cardiology follow-up; using diuretic sparingly.  Tolerating medications.

## 2012-12-09 ENCOUNTER — Telehealth: Payer: Self-pay

## 2012-12-09 NOTE — Telephone Encounter (Signed)
PT STATES SHE WAS SEEN TO GET REFILLS ON HER MEDICINES AND WAS GIVEN A 90 DAY SUPPLY ON EVERYTHING EXCEPT HER TRAMADOL AND SHE DOES NEED THE 90 INSTEAD OF THE 60 ALSO DOESN'T NEED THE PRAVASTATIN ANYMORE FROM [place] BECAUSE HER HEART DR PRESCRIBES THAT NOW. PLEASE CALL [phone] TO LET HER KNOW ABOUT WHEN IT IS DONE

## 2012-12-10 ENCOUNTER — Ambulatory Visit (HOSPITAL_COMMUNITY)
Admission: RE | Admit: 2012-12-10 | Discharge: 2012-12-10 | Disposition: A | Discharge status: Home / Self Care | Payer: Medicare Other | Source: Ambulatory Visit | Attending: Internal Medicine | Admitting: Internal Medicine

## 2012-12-10 ENCOUNTER — Encounter (HOSPITAL_COMMUNITY): Payer: Self-pay

## 2012-12-10 VITALS — BP 132/66 | HR 74 | Wt 265.8 lb

## 2012-12-10 DIAGNOSIS — E7800 Pure hypercholesterolemia, unspecified: Secondary | ICD-10-CM | POA: Insufficient documentation

## 2012-12-10 DIAGNOSIS — I5022 Chronic systolic (congestive) heart failure: Secondary | ICD-10-CM | POA: Insufficient documentation

## 2012-12-10 DIAGNOSIS — I5021 Acute systolic (congestive) heart failure: Secondary | ICD-10-CM

## 2012-12-10 MED ORDER — HYDRALAZINE HCL 25 MG PO TABS: 25.0000 mg | ORAL_TABLET | Freq: Three times a day (TID) | ORAL | Status: DC

## 2012-12-10 MED ORDER — ASPIRIN EC 81 MG PO TBEC: 81.0000 mg | DELAYED_RELEASE_TABLET | Freq: Every day | ORAL | Status: DC

## 2012-12-10 MED ORDER — ISOSORBIDE MONONITRATE ER 30 MG PO TB24: 30.0000 mg | ORAL_TABLET | Freq: Every day | ORAL | Status: DC

## 2012-12-10 MED ORDER — DIGOXIN 125 MCG PO TABS: 0.1250 mg | ORAL_TABLET | Freq: Every day | ORAL | Status: DC

## 2012-12-10 MED ORDER — CARVEDILOL 12.5 MG PO TABS: 18.7500 mg | ORAL_TABLET | Freq: Two times a day (BID) | ORAL | Status: DC

## 2012-12-10 MED ORDER — PRAVASTATIN SODIUM 20 MG PO TABS: 20.0000 mg | ORAL_TABLET | Freq: Every day | ORAL | Status: DC

## 2012-12-10 NOTE — Assessment & Plan Note (Signed)
NYHA I. Volume status stable. Continue current diuretic regimen. Increase carvedilol 18.75 mg twice a day. Reinforced daily weights, low salt food choices, and limiting fluids to < 2 liters. Follow up in 1 month.

## 2012-12-10 NOTE — Telephone Encounter (Signed)
Please advise on Tramadol refill. 

## 2012-12-10 NOTE — Patient Instructions (Addendum)
Follow up in 1 month  Take carvedilol 18.75 mg twice a day  Do the following things EVERYDAY: 1) Weigh yourself in the morning before breakfast. Write it down and keep it in a log. 2) Take your medicines as prescribed 3) Eat low salt foods-Limit salt (sodium) to 2000 mg per day.  4) Stay as active as you can everyday 5) Limit all fluids for the day to less than 2 liters

## 2012-12-10 NOTE — Addendum Note (Signed)
Encounter addended by: Kerry Dory, CMA on: 12/10/2012  3:55 PM<BR>     Documentation filed: Orders

## 2012-12-10 NOTE — Telephone Encounter (Signed)
Rx sent on 12/07/12 #180 with 3 RF

## 2012-12-10 NOTE — Progress Notes (Signed)
Patient ID: [name], female   DOB: 12-18-1955, 57 y.o.   MRN: [id number]   Weight Range   275 pounds  Baseline proBNP   5000.6    HPI: [name] is a 57 y.o. female with past medical history of HL, morbid obesity, chronic pain and DM since she was 57 years old. She had neuropathy in 2011 causing her to be in a wheelchair, this recovered after 8 months of therapy.   Admitted to [facility] with progressive dyspnea and lower extremity edema.  Found to have new onset systolic HF.  ProBNP 5000.  Troponin 0.64. EKG LBBB.  She diuresed well on IV lasix.    Underwent RHC/LHC on 07/21/12 showing:  RA = 7  RV = 39/5/8  PA = 43/20 (31)  PCW = 17  Fick cardiac output/index = 5.4/2.3  PVR = 2.5 Woods  FA sat = 90%  PA sat = 58%, 57%  Ao Pressure: 118/77 (95)  LV Pressure: 116/21//2  Ao Pressure: 118/77 (95)  LV Pressure: 116/21//2  Left main: Long. Mild plaque  LAD: Completely occluded proximally after first diagonal. 1st diagonal 30-40% ostial. 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 Ostial 80%  RAMUS: Moderate sized vessel. Diffuse 30%  RCA: Very large dominant vessel. Diffuse 40% in midsection.. 30% around distal bend. PDA moderate sized vessel subtotally occluded. 2 large PLs with mild plaque. Very fain collateral to LAD.  LV-gram done in the RAO projection: Ejection fraction = 15% Distal 2/3 of ventricle is akinetic   S/P complex PTCA and stenting of LAD (chronic total occlusion) on 10/17 by [name].  She was placed on Brilinta and ASA for 1 year.  Possible PCI of diag in ~4 weeks depending clinical course.  Discharged on 10/18 at 275 pounds.   ECHO 10/29/12 EF 35-40%   She returns for follow up today. She is not on an ace inhibitor due to hyperkalemia.  Last visit carvedilol increased to 12.5 mg twice a day. Denies SOB/PND/Orthopnea/CP. BP at home 130/70. Compliant with medications. Weight at  home 260-265 pounds. She has not required any Torsemide. She only takes Torsemide if her weight is 265 pounds. Follows low salt diet. Not exercising but walking. Unable to start cardiac rehab due to deductible.    ROS: All systems negative except as listed in HPI, PMH and Problem List.  Past Medical History  Diagnosis Date  . Pure hypercholesterolemia   . Obesity, unspecified   . Unspecified vitamin D deficiency   . Pure hypercholesterolemia   . Type II or unspecified type diabetes mellitus without mention of complication, not stated as uncontrolled   . Elevated blood pressure reading without diagnosis of hypertension   . Neuropathy 2011  . Chicken pox   . Coronary artery disease   . CHF (congestive heart failure) 07/19/2012    EF 15% by cardiac catheterization.  Admission.  . Myocardial infarct 07/19/12    s/p cardiac catheterization with stenting to LAD.  Marland Kitchen Hypertension     Current Outpatient Prescriptions  Medication Sig Dispense Refill  . aspirin EC 81 MG tablet Take 81 mg by mouth daily.      . carvedilol (COREG) 12.5 MG tablet Take 1 tablet (12.5 mg total) by mouth 2 (two) times daily with a meal.  60 tablet  3  . cyclobenzaprine (FLEXERIL) 10 MG tablet Take 1 tablet (10 mg total) by mouth at bedtime as needed. For pain/sleep  90 tablet  1  . digoxin (LANOXIN) 0.125 MG tablet Take 1 tablet (0.125 mg total) by mouth daily.  90 tablet  3  . gabapentin (NEURONTIN) 600 MG tablet Take 1 tablet (600 mg total) by mouth 3 (three) times daily.  270 tablet  1  . glimepiride (AMARYL) 4 MG tablet Take 1 tablet (4 mg total) by mouth 2 (two) times daily before a meal.  180 tablet  3  . hydrALAZINE (APRESOLINE) 25 MG tablet Take 1 tablet (25 mg total) by mouth 3 (three) times daily.  90 tablet  6  . isosorbide mononitrate (IMDUR) 30 MG 24 hr tablet Take 1 tablet (30 mg total) by mouth daily.  30 tablet  6  . metFORMIN (GLUCOPHAGE) 1000 MG tablet Take 1 tablet (1,000 mg total) by mouth 2 (two)  times daily with a meal.  180 tablet  3  . nitroGLYCERIN (NITROSTAT) 0.4 MG SL tablet Place 1 tablet (0.4 mg total) under the tongue every 5 (five) minutes as needed for chest pain.  30 tablet  0  . nortriptyline (PAMELOR) 50 MG capsule Take 1 capsule (50 mg total) by mouth at bedtime.  90 capsule  3  . potassium chloride SA (K-DUR,KLOR-CON) 20 MEQ tablet Take 20 mEq by mouth daily as needed. Take only when take Torsemide      . pravastatin (PRAVACHOL) 20 MG tablet Take 1 tablet (20 mg total) by mouth at bedtime.  90 tablet  3  . Ticagrelor (BRILINTA) 90 MG TABS tablet Take 1 tablet (90 mg total) by mouth 2 (two) times daily.  180 tablet  3  . torsemide (DEMADEX) 20 MG tablet Take 20 mg by mouth daily as needed.      . traMADol (ULTRAM) 50 MG tablet Take 1 tablet (50 mg total) by mouth every 6 (six) hours as needed. For pain  180 tablet  1   No current facility-administered medications for this encounter.     PHYSICAL EXAM: Filed Vitals:   12/10/12 0948  BP: 132/66  Pulse: 74  Weight: 265 lb 12.8 oz (120.566 kg)  SpO2: 99%   Weight 270>276>265  pounds General: Obese, Well appearing. No resp difficulty  HEENT: normal  Neck: supple. JVP 5-6. Carotids 2+ bilat; no bruits. No lymphadenopathy or thryomegaly appreciated.  Cor: PMI nondisplaced. Regular rate & rhythm. Split S2. No S3.  Lungs: clear  Abdomen: obese, soft, nontender, nondistended. No hepatosplenomegaly. No bruits or masses. Good bowel sounds.  Extremities: no cyanosis, clubbing, rash, trace edema  Neuro: alert & orientedx3, cranial nerves grossly intact. moves all 4 extremities w/o difficulty. Affect pleasant    ASSESSMENT & PLAN:

## 2012-12-12 NOTE — Telephone Encounter (Signed)
Called her to advise.  

## 2012-12-14 ENCOUNTER — Other Ambulatory Visit (HOSPITAL_COMMUNITY): Payer: Self-pay | Admitting: Cardiology

## 2012-12-14 MED ORDER — CARVEDILOL 12.5 MG PO TABS: 18.7500 mg | ORAL_TABLET | Freq: Two times a day (BID) | ORAL | Status: DC

## 2012-12-17 ENCOUNTER — Other Ambulatory Visit (HOSPITAL_COMMUNITY): Payer: Self-pay | Admitting: *Deleted

## 2012-12-18 ENCOUNTER — Other Ambulatory Visit: Payer: Self-pay | Admitting: Radiology

## 2012-12-18 DIAGNOSIS — E1142 Type 2 diabetes mellitus with diabetic polyneuropathy: Secondary | ICD-10-CM

## 2012-12-18 NOTE — Telephone Encounter (Signed)
[employer] has sent a form regarding Flexeril not indicated with heart failure. Please advise. Do you think this should be changed? Fax from [employer] Will keep fax.

## 2012-12-18 NOTE — Telephone Encounter (Signed)
Will forward to Dr. Tamala Julian to advise on this medication  Dr. Tamala Julian, see message below regarding flexeril being contraindicated in CHF patients

## 2012-12-22 ENCOUNTER — Other Ambulatory Visit (HOSPITAL_COMMUNITY): Payer: Self-pay | Admitting: *Deleted

## 2012-12-22 MED ORDER — SITAGLIPTIN PHOSPHATE 50 MG PO TABS: 50.0000 mg | ORAL_TABLET | Freq: Every day | ORAL | Status: DC

## 2012-12-22 MED ORDER — METAXALONE 800 MG PO TABS: 800.0000 mg | ORAL_TABLET | Freq: Every evening | ORAL | Status: DC | PRN

## 2012-12-22 MED ORDER — CARVEDILOL 12.5 MG PO TABS: 18.7500 mg | ORAL_TABLET | Freq: Two times a day (BID) | ORAL | Status: DC

## 2012-12-22 NOTE — Telephone Encounter (Signed)
Called patient to advise, she wants sent to mail order pended, but want you to review sig and quantity before we send.

## 2012-12-22 NOTE — Telephone Encounter (Signed)
Yes, I do think pt should be changed.  I can send in new rx for Skelaxin to replace Flexeril.  Has she ever taken Skelaxin?  If yes, any side effects?  Would she like me to send to [facility] or local pharmacy?

## 2012-12-22 NOTE — Addendum Note (Signed)
Addended by: Wardell Honour on: 12/22/2012 11:45 AM   Modules accepted: Orders

## 2012-12-24 ENCOUNTER — Ambulatory Visit: Payer: Medicare Other

## 2012-12-25 ENCOUNTER — Encounter: Payer: Self-pay | Admitting: *Deleted

## 2013-01-13 ENCOUNTER — Ambulatory Visit
Admission: RE | Admit: 2013-01-13 | Discharge: 2013-01-13 | Disposition: A | Discharge status: Home / Self Care | Payer: Medicare Other | Source: Ambulatory Visit | Attending: Family Medicine | Admitting: Family Medicine

## 2013-01-13 DIAGNOSIS — Z1231 Encounter for screening mammogram for malignant neoplasm of breast: Secondary | ICD-10-CM

## 2013-01-14 ENCOUNTER — Ambulatory Visit (HOSPITAL_COMMUNITY)
Admission: RE | Admit: 2013-01-14 | Discharge: 2013-01-14 | Disposition: A | Discharge status: Home / Self Care | Payer: Medicare Other | Source: Ambulatory Visit | Attending: Internal Medicine | Admitting: Internal Medicine

## 2013-01-14 VITALS — BP 104/56 | HR 85 | Wt 266.8 lb

## 2013-01-14 DIAGNOSIS — G8929 Other chronic pain: Secondary | ICD-10-CM | POA: Insufficient documentation

## 2013-01-14 DIAGNOSIS — D509 Iron deficiency anemia, unspecified: Secondary | ICD-10-CM

## 2013-01-14 DIAGNOSIS — I5020 Unspecified systolic (congestive) heart failure: Secondary | ICD-10-CM | POA: Insufficient documentation

## 2013-01-14 DIAGNOSIS — E7800 Pure hypercholesterolemia, unspecified: Secondary | ICD-10-CM | POA: Insufficient documentation

## 2013-01-14 DIAGNOSIS — I447 Left bundle-branch block, unspecified: Secondary | ICD-10-CM | POA: Insufficient documentation

## 2013-01-14 DIAGNOSIS — I10 Essential (primary) hypertension: Secondary | ICD-10-CM | POA: Insufficient documentation

## 2013-01-14 DIAGNOSIS — R609 Edema, unspecified: Secondary | ICD-10-CM | POA: Insufficient documentation

## 2013-01-14 DIAGNOSIS — R0989 Other specified symptoms and signs involving the circulatory and respiratory systems: Secondary | ICD-10-CM | POA: Insufficient documentation

## 2013-01-14 DIAGNOSIS — I5021 Acute systolic (congestive) heart failure: Secondary | ICD-10-CM

## 2013-01-14 DIAGNOSIS — E875 Hyperkalemia: Secondary | ICD-10-CM | POA: Insufficient documentation

## 2013-01-14 DIAGNOSIS — E119 Type 2 diabetes mellitus without complications: Secondary | ICD-10-CM | POA: Insufficient documentation

## 2013-01-14 DIAGNOSIS — E559 Vitamin D deficiency, unspecified: Secondary | ICD-10-CM | POA: Insufficient documentation

## 2013-01-14 DIAGNOSIS — I509 Heart failure, unspecified: Secondary | ICD-10-CM | POA: Insufficient documentation

## 2013-01-14 DIAGNOSIS — I252 Old myocardial infarction: Secondary | ICD-10-CM | POA: Insufficient documentation

## 2013-01-14 DIAGNOSIS — I2510 Atherosclerotic heart disease of native coronary artery without angina pectoris: Secondary | ICD-10-CM

## 2013-01-14 DIAGNOSIS — R0609 Other forms of dyspnea: Secondary | ICD-10-CM | POA: Insufficient documentation

## 2013-01-14 DIAGNOSIS — I5022 Chronic systolic (congestive) heart failure: Secondary | ICD-10-CM

## 2013-01-14 MED ORDER — HYDRALAZINE HCL 25 MG PO TABS: 37.5000 mg | ORAL_TABLET | Freq: Three times a day (TID) | ORAL | Status: DC

## 2013-01-14 NOTE — Progress Notes (Signed)
Weight Range   275 pounds  Baseline proBNP   5000.6    HPI: [name] is a 57 y.o. female with past medical history of HL, morbid obesity, chronic pain and DM since she was 57 years old. She had neuropathy in 2011 causing her to be in a wheelchair, this recovered after 8 months of therapy.   Admitted to [facility] with progressive dyspnea and lower extremity edema.  Found to have new onset systolic HF.  ProBNP 5000.  Troponin 0.64. EKG LBBB.  She diuresed well on IV lasix.    Underwent RHC/LHC on 07/21/12 showing:  RA = 7  RV = 39/5/8  PA = 43/20 (31)  PCW = 17  Fick cardiac output/index = 5.4/2.3  PVR = 2.5 [place]  FA sat = 90%  PA sat = 58%, 57%  Ao Pressure: 118/77 (95)  LV Pressure: 116/21//2  Ao Pressure: 118/77 (95)  LV Pressure: 116/21//2  Left main: Long. Mild plaque  LAD: Completely occluded proximally after first diagonal. 1st diagonal 30-40% ostial. 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 Ostial 80%  RAMUS: Moderate sized vessel. Diffuse 30%  RCA: Very large dominant vessel. Diffuse 40% in midsection.. 30% around distal bend. PDA moderate sized vessel subtotally occluded. 2 large PLs with mild plaque. Very fain collateral to LAD.  LV-gram done in the RAO projection: Ejection fraction = 15% Distal 2/3 of ventricle is akinetic   S/P complex PTCA and stenting of LAD (chronic total occlusion) on 10/17 by Dr. Burt Knack.  She was placed on Brilinta and ASA for 1 year.  Possible PCI of diag in ~4 weeks depending clinical course.  Discharged on 10/18 at 275 pounds.   ECHO 10/29/12 EF 35-40%   She returns for follow up today with her mother.  Last visit carvedilol increased to 18.75 mg BID.  She is not on an ace inhibitor due to hyperkalemia.  Stairs still giving her some trouble.  Weight 258-260 pounds.  Some fatigue but unchanged from previous.  She is using sliding scale torsemide, maybe twice  since last visit.  Denies dyspnea/PND or orthopnea.  No chest pain.    ROS: All systems negative except as listed in HPI, PMH and Problem List.  Past Medical History  Diagnosis Date  . Pure hypercholesterolemia   . Obesity, unspecified   . Unspecified vitamin D deficiency   . Pure hypercholesterolemia   . Type II or unspecified type diabetes mellitus without mention of complication, not stated as uncontrolled   . Elevated blood pressure reading without diagnosis of hypertension   . Neuropathy 2011  . Chicken pox   . Coronary artery disease   . CHF (congestive heart failure) 07/19/2012    EF 15% by cardiac catheterization.  Admission.  . Myocardial infarct 07/19/12    s/p cardiac catheterization with stenting to LAD.  Marland Kitchen Hypertension     Current Outpatient Prescriptions  Medication Sig Dispense Refill  . aspirin EC 81 MG tablet Take 1 tablet (81 mg total) by mouth daily.  90 tablet  3  . carvedilol (COREG) 12.5 MG tablet Take 1.5 tablets (18.75 mg total) by mouth 2 (two) times daily with a meal.  270 tablet  3  . cyclobenzaprine (FLEXERIL) 10 MG tablet Take 1 tablet (10 mg total) by mouth at bedtime as needed. For pain/sleep  90 tablet  1  . digoxin (LANOXIN) 0.125 MG tablet Take 1 tablet (0.125 mg total) by mouth  daily.  90 tablet  3  . gabapentin (NEURONTIN) 600 MG tablet Take 1 tablet (600 mg total) by mouth 3 (three) times daily.  270 tablet  1  . glimepiride (AMARYL) 4 MG tablet Take 1 tablet (4 mg total) by mouth 2 (two) times daily before a meal.  180 tablet  3  . hydrALAZINE (APRESOLINE) 25 MG tablet Take 1 tablet (25 mg total) by mouth 3 (three) times daily.  270 tablet  3  . isosorbide mononitrate (IMDUR) 30 MG 24 hr tablet Take 1 tablet (30 mg total) by mouth daily.  90 tablet  3  . metaxalone (SKELAXIN) 800 MG tablet Take 1 tablet (800 mg total) by mouth at bedtime as needed for pain.  90 tablet  1  . metFORMIN (GLUCOPHAGE) 1000 MG tablet Take 1 tablet (1,000 mg total) by  mouth 2 (two) times daily with a meal.  180 tablet  3  . nitroGLYCERIN (NITROSTAT) 0.4 MG SL tablet Place 1 tablet (0.4 mg total) under the tongue every 5 (five) minutes as needed for chest pain.  30 tablet  0  . nortriptyline (PAMELOR) 50 MG capsule Take 1 capsule (50 mg total) by mouth at bedtime.  90 capsule  3  . potassium chloride SA (K-DUR,KLOR-CON) 20 MEQ tablet Take 20 mEq by mouth daily as needed. Take only when take Torsemide      . pravastatin (PRAVACHOL) 20 MG tablet Take 1 tablet (20 mg total) by mouth at bedtime.  90 tablet  3  . sitaGLIPtin (JANUVIA) 50 MG tablet Take 1 tablet (50 mg total) by mouth daily.  90 tablet  1  . Ticagrelor (BRILINTA) 90 MG TABS tablet Take 1 tablet (90 mg total) by mouth 2 (two) times daily.  180 tablet  3  . torsemide (DEMADEX) 20 MG tablet Take 20 mg by mouth daily as needed.      . traMADol (ULTRAM) 50 MG tablet Take 1 tablet (50 mg total) by mouth every 6 (six) hours as needed. For pain  180 tablet  1   No current facility-administered medications for this encounter.     PHYSICAL EXAM: Filed Vitals:   01/14/13 1009  BP: 104/56  Pulse: 85  Weight: 266 lb 12 oz (120.997 kg)  SpO2: 100%   General: Obese, Well appearing. No resp difficulty  HEENT: normal  Neck: supple. JVP flat. Carotids 2+ bilat; no bruits. No lymphadenopathy or thryomegaly appreciated.  Cor: PMI nondisplaced. Regular rate & rhythm. Split S2. No S3.  Lungs: clear  Abdomen: obese, soft, nontender, nondistended. No hepatosplenomegaly. No bruits or masses. Good bowel sounds.  Extremities: no cyanosis, clubbing, rash, trace edema  Neuro: alert & orientedx3, cranial nerves grossly intact. moves all 4 extremities w/o difficulty. Affect pleasant    ASSESSMENT & PLAN:

## 2013-01-14 NOTE — Assessment & Plan Note (Signed)
No ischemic symptoms, continue ASA, carvedilol, brilinta and pravachol.

## 2013-01-14 NOTE — Assessment & Plan Note (Addendum)
She is doing very well.  Volume status well controlled on prn torsemide.  NYHA II.  Will titrate hydralazine 37.5 mg TID, continue imdur, digoxin and carvedilol.  Will have her follow up in 2 months with repeat echo as her EF has been improving slowly over the last 6 months.  She can not afford cardiac rehab at this time but is going to start [place] with her mother.

## 2013-01-14 NOTE — Patient Instructions (Addendum)
Increase hydralazine 1.5 tabs (37.5 mg) three times daily.  Follow up 2 months with echo.  Your physician has requested that you have an echocardiogram. Echocardiography is a painless test that uses sound waves to create images of your heart. It provides your doctor with information about the size and shape of your heart and how well your heart's chambers and valves are working. This procedure takes approximately one hour. There are no restrictions for this procedure.

## 2013-01-14 NOTE — Assessment & Plan Note (Signed)
She has plans to start OTC iron tablets per Dr. Thompson Caul request.

## 2013-02-09 ENCOUNTER — Telehealth: Payer: Self-pay

## 2013-02-09 NOTE — Telephone Encounter (Signed)
Called pt to clarify Dx and h/o other medications r/t prior auth for Skelaxin. Pt stated that she takes the Skelaxin for her neuropathy, but stated that the pharmacist at [facility] had advised her that Skelaxin is not a good medication to be on in a pt w/CHF. Dr Tamala Julian, do you want to change this to another muscle relaxant? Pt tried Flexeril in the past w/a back problem but doesn't remember if it worked well for her.

## 2013-02-17 NOTE — Telephone Encounter (Signed)
Dr Tamala Julian, this is in my open encounters. Has this been done?

## 2013-02-23 NOTE — Telephone Encounter (Signed)
Please call patient back --- 1.  We stopped her Flexeril 3-6 months ago due to a contraindication of Flexeril use with CHF.  2.  I then switched her to Skelaxin because I did not find a contraindication with CHF; thus, if the pharmacist states that Skelaxin is contraindicated in CHF, she cannot use it. Have her stop Skelaxin.    3.  I will research further, but I am concerned that all muscle relaxers may be contraindicated with CHF.

## 2013-02-24 NOTE — Telephone Encounter (Signed)
Spoke w pt who agreed to stop the Skelaxin. D/W her that all muscle relaxers may be a problem w/ CHF, but advised her that Dr Tamala Julian will research further and we will call her back if she finds that there is one that would be safe. I advised her that if her pharmacist has a safe alternative to CB and we will get info to Dr Tamala Julian. Pt agreed.

## 2013-03-03 NOTE — Telephone Encounter (Signed)
I received a call from [facility] pharmacist asking about PA on Skelaxin. I advised her that we had DCd for pt after pt's report that [facility] pharmacist stated there was a contraindication w/CHF. The pharmacist I was speaking to stated that she does NOT see any contraindication w/Skelaxin in CHF pts (although there is one w/Flexeril), only contraindication w/liver and renal problems and caution in elderly. Dr Tamala Julian, is it OK for me to proceed w/PA and have pt fill and take the Skelaxin?

## 2013-03-05 NOTE — Telephone Encounter (Signed)
To you 

## 2013-03-05 NOTE — Telephone Encounter (Signed)
Yes. Please proceed with prior authorization for Skelaxin.  Please also call pt and advise her that Elwyn Reach, RN spoke with pharmacist at [facility] who did not find a contraindication of Skelaxin with CHF.  Also reassure her that I had also researched Skelaxin before prescribing it to her and I also did not find a contraindication to Skelaxin in CHF.

## 2013-03-05 NOTE — Telephone Encounter (Signed)
[employer] is faxing correct form to complete. Notified pt of the info from pharmacist and OK from Dr Tamala Julian. Pt agreed, and I will complete PA info when I receive form.

## 2013-03-08 NOTE — Telephone Encounter (Signed)
PA approved through 03/05/14. Notified pt on VM.

## 2013-03-11 ENCOUNTER — Other Ambulatory Visit (HOSPITAL_COMMUNITY): Payer: Self-pay | Admitting: Internal Medicine

## 2013-03-11 ENCOUNTER — Ambulatory Visit (HOSPITAL_BASED_OUTPATIENT_CLINIC_OR_DEPARTMENT_OTHER)
Admission: RE | Admit: 2013-03-11 | Discharge: 2013-03-11 | Disposition: A | Discharge status: Home / Self Care | Payer: Medicare Other | Source: Ambulatory Visit | Attending: Internal Medicine | Admitting: Internal Medicine

## 2013-03-11 ENCOUNTER — Ambulatory Visit (HOSPITAL_COMMUNITY)
Admission: RE | Admit: 2013-03-11 | Discharge: 2013-03-11 | Disposition: A | Discharge status: Home / Self Care | Payer: Medicare Other | Source: Ambulatory Visit | Attending: Internal Medicine | Admitting: Internal Medicine

## 2013-03-11 ENCOUNTER — Encounter (HOSPITAL_COMMUNITY): Payer: Self-pay

## 2013-03-11 VITALS — BP 138/58 | HR 75 | Wt 255.8 lb

## 2013-03-11 DIAGNOSIS — E7800 Pure hypercholesterolemia, unspecified: Secondary | ICD-10-CM

## 2013-03-11 DIAGNOSIS — I519 Heart disease, unspecified: Secondary | ICD-10-CM | POA: Insufficient documentation

## 2013-03-11 DIAGNOSIS — I5023 Acute on chronic systolic (congestive) heart failure: Secondary | ICD-10-CM

## 2013-03-11 DIAGNOSIS — I5021 Acute systolic (congestive) heart failure: Secondary | ICD-10-CM

## 2013-03-11 DIAGNOSIS — I509 Heart failure, unspecified: Secondary | ICD-10-CM | POA: Insufficient documentation

## 2013-03-11 DIAGNOSIS — I059 Rheumatic mitral valve disease, unspecified: Secondary | ICD-10-CM | POA: Insufficient documentation

## 2013-03-11 DIAGNOSIS — E119 Type 2 diabetes mellitus without complications: Secondary | ICD-10-CM | POA: Insufficient documentation

## 2013-03-11 DIAGNOSIS — I5022 Chronic systolic (congestive) heart failure: Secondary | ICD-10-CM

## 2013-03-11 DIAGNOSIS — I2510 Atherosclerotic heart disease of native coronary artery without angina pectoris: Secondary | ICD-10-CM

## 2013-03-11 DIAGNOSIS — I359 Nonrheumatic aortic valve disorder, unspecified: Secondary | ICD-10-CM | POA: Insufficient documentation

## 2013-03-11 MED ORDER — HYDRALAZINE HCL 50 MG PO TABS: 50.0000 mg | ORAL_TABLET | Freq: Three times a day (TID) | ORAL | Status: DC

## 2013-03-11 MED ORDER — PRAVASTATIN SODIUM 40 MG PO TABS: 40.0000 mg | ORAL_TABLET | Freq: Every day | ORAL | Status: DC

## 2013-03-11 NOTE — Assessment & Plan Note (Signed)
Lipids followed by Dr. Tamala Julian. Unable to tolerate high potency statins. On Prava 20. Will increase to 40 daily.

## 2013-03-11 NOTE — Patient Instructions (Addendum)
Stop Digoxin Increase Hydralazine to 50 mg Three times a day  Increase Pravastatin to 40 mg daily  We will contact you in 6 months to schedule your next appointment.

## 2013-03-11 NOTE — Progress Notes (Signed)
Patient ID: [name], female   DOB: [birth year], 56 y.o.   MRN: [id number]  Weight Range   275 pounds  Baseline proBNP   5000.6    HPI: [name] is a 57 y.o. female with past medical history of HL, morbid obesity, chronic pain and DM since she was 57 years old. She had neuropathy in 2011 causing her to be in a wheelchair, this recovered after 8 months of therapy.   Admitted to [facility] 10/13 with progressive dyspnea and lower extremity edema.  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  Underwent complex PTCA and stenting of LAD by Dr. Burt Knack.  She was placed on Brilinta and ASA for 1 year.    She has been unable to tolerate spiro or lisinopril due to hyperkalemia.  ECHO 10/29/12 EF 35-40%  ECHO 03/11/13 EF 40%  She returns for follow up. Last visit hydralazine increased to 37.5 mg  today with her mother. Denies SOB/PND/Orthopnea/edema. . Active. Still Brillinta. No bleeding.  Weight at home 250 pounds. Compliant with medications. Follows low salt diet. Has only had to take lasix once in 3 months.     ROS: All systems negative except as listed in HPI, PMH and Problem List.  Past Medical History  Diagnosis Date  . Pure hypercholesterolemia   . Obesity, unspecified   . Unspecified vitamin D deficiency   . Pure hypercholesterolemia   . Type II or unspecified type diabetes mellitus without mention of complication, not stated as uncontrolled   . Elevated blood pressure reading without diagnosis of hypertension   . Neuropathy 2011  . Chicken pox   . Coronary artery disease   . CHF (congestive heart failure) 07/19/2012    EF 15% by cardiac catheterization.  Admission.  . Myocardial infarct 07/19/12    s/p cardiac catheterization with stenting to LAD.  Marland Kitchen Hypertension     Current Outpatient Prescriptions  Medication Sig Dispense Refill  . aspirin EC 81 MG tablet Take 1 tablet (81 mg total) by mouth daily.  90  tablet  3  . carvedilol (COREG) 12.5 MG tablet Take 1.5 tablets (18.75 mg total) by mouth 2 (two) times daily with a meal.  270 tablet  3  . cyclobenzaprine (FLEXERIL) 10 MG tablet Take 1 tablet (10 mg total) by mouth at bedtime as needed. For pain/sleep  90 tablet  1  . digoxin (LANOXIN) 0.125 MG tablet Take 1 tablet (0.125 mg total) by mouth daily.  90 tablet  3  . gabapentin (NEURONTIN) 600 MG tablet Take 1 tablet (600 mg total) by mouth 3 (three) times daily.  270 tablet  1  . glimepiride (AMARYL) 4 MG tablet Take 1 tablet (4 mg total) by mouth 2 (two) times daily before a meal.  180 tablet  3  . hydrALAZINE (APRESOLINE) 25 MG tablet Take 1.5 tablets (37.5 mg total) by mouth 3 (three) times daily.  270 tablet  3  . isosorbide mononitrate (IMDUR) 30 MG 24 hr tablet Take 1 tablet (30 mg total) by mouth daily.  90 tablet  3  . metaxalone (SKELAXIN) 800 MG tablet Take 1 tablet (800 mg total) by mouth at bedtime as needed for pain.  90 tablet  1  . metFORMIN (GLUCOPHAGE) 1000 MG tablet Take 1 tablet (1,000 mg total) by mouth 2 (two) times daily with a meal.  180 tablet  3  . nitroGLYCERIN (NITROSTAT) 0.4 MG SL tablet  Place 1 tablet (0.4 mg total) under the tongue every 5 (five) minutes as needed for chest pain.  30 tablet  0  . nortriptyline (PAMELOR) 50 MG capsule Take 1 capsule (50 mg total) by mouth at bedtime.  90 capsule  3  . potassium chloride SA (K-DUR,KLOR-CON) 20 MEQ tablet Take 20 mEq by mouth daily as needed. Take only when take Torsemide      . pravastatin (PRAVACHOL) 20 MG tablet Take 1 tablet (20 mg total) by mouth at bedtime.  90 tablet  3  . sitaGLIPtin (JANUVIA) 50 MG tablet Take 1 tablet (50 mg total) by mouth daily.  90 tablet  1  . Ticagrelor (BRILINTA) 90 MG TABS tablet Take 1 tablet (90 mg total) by mouth 2 (two) times daily.  180 tablet  3  . torsemide (DEMADEX) 20 MG tablet Take 20 mg by mouth daily as needed.      . traMADol (ULTRAM) 50 MG tablet Take 1 tablet (50 mg total)  by mouth every 6 (six) hours as needed. For pain  180 tablet  1   No current facility-administered medications for this encounter.     PHYSICAL EXAM: Filed Vitals:   03/11/13 1101  BP: 138/58  Pulse: 75  Weight: 255 lb 12.8 oz (116.03 kg)  SpO2: 99%   General: Obese, Well appearing. No resp difficulty  HEENT: normal  Neck: supple. JVP 6. Carotids 2+ bilat; no bruits. No lymphadenopathy or thryomegaly appreciated.  Cor: PMI nondisplaced. Regular rate & rhythm. Split S2. No S3.  Lungs: clear  Abdomen: obese, soft, nontender, nondistended. No hepatosplenomegaly. No bruits or masses. Good bowel sounds.  Extremities: no cyanosis, clubbing, rash, trace edema  Neuro: alert & orientedx3, cranial nerves grossly intact. moves all 4 extremities w/o difficulty. Affect pleasant    ASSESSMENT & PLAN:

## 2013-03-11 NOTE — Assessment & Plan Note (Signed)
Doing very well post-PCI. Continue asa/Brillinta for 1 year then can maintain on ASA alone.

## 2013-03-11 NOTE — Assessment & Plan Note (Signed)
Doing very well. NYHA I-II. Echo reviewed in clinic EF stable at ~40%. Volume status looks great. Doing a great job with daily weighing and sliding scale lasix with only minimal diuretic requirement. Unable to tolerate ACE/spiro due to severe hyperkalemia in past. She will have labs drawn with PCP next week and send to [place] to review and reconsider this. For now, will stop digoxin and increase hydralazine to 50 tid.

## 2013-03-11 NOTE — Addendum Note (Signed)
Encounter addended by: Scarlette Calico, RN on: 03/11/2013 11:30 AM<BR>     Documentation filed: Medications, Patient Instructions Section, Orders

## 2013-03-11 NOTE — Progress Notes (Signed)
  Echocardiogram 2D Echocardiogram has been performed.  Zaid Tomes FRANCES 03/11/2013, 11:06 AM

## 2013-03-16 ENCOUNTER — Encounter: Payer: Self-pay | Admitting: Family Medicine

## 2013-03-16 ENCOUNTER — Ambulatory Visit (INDEPENDENT_AMBULATORY_CARE_PROVIDER_SITE_OTHER): Payer: Medicare Other | Admitting: Family Medicine

## 2013-03-16 VITALS — BP 110/60 | HR 69 | Temp 98.3°F | Resp 16 | Ht 68.0 in | Wt 254.0 lb

## 2013-03-16 DIAGNOSIS — I10 Essential (primary) hypertension: Secondary | ICD-10-CM

## 2013-03-16 DIAGNOSIS — E119 Type 2 diabetes mellitus without complications: Secondary | ICD-10-CM

## 2013-03-16 DIAGNOSIS — E7800 Pure hypercholesterolemia, unspecified: Secondary | ICD-10-CM

## 2013-03-16 DIAGNOSIS — R197 Diarrhea, unspecified: Secondary | ICD-10-CM

## 2013-03-16 DIAGNOSIS — D509 Iron deficiency anemia, unspecified: Secondary | ICD-10-CM

## 2013-03-16 DIAGNOSIS — Z23 Encounter for immunization: Secondary | ICD-10-CM

## 2013-03-16 DIAGNOSIS — Z139 Encounter for screening, unspecified: Secondary | ICD-10-CM

## 2013-03-16 DIAGNOSIS — Z0000 Encounter for general adult medical examination without abnormal findings: Secondary | ICD-10-CM

## 2013-03-16 LAB — POCT URINALYSIS DIPSTICK
Bilirubin, UA: NEGATIVE
Nitrite, UA: NEGATIVE
Urobilinogen, UA: 0.2
pH, UA: 5.5

## 2013-03-16 LAB — CBC WITH DIFFERENTIAL/PLATELET
Basophils Absolute: 0 10*3/uL (ref 0.0–0.1)
Basophils Relative: 0 % (ref 0–1)
Eosinophils Absolute: 0.2 10*3/uL (ref 0.0–0.7)
Lymphs Abs: 1.5 10*3/uL (ref 0.7–4.0)
MCH: 27.6 pg (ref 26.0–34.0)
MCHC: 33.3 g/dL (ref 30.0–36.0)
Neutrophils Relative %: 73 % (ref 43–77)
Platelets: 222 10*3/uL (ref 150–400)
RBC: 4.28 MIL/uL (ref 3.87–5.11)
RDW: 15 % (ref 11.5–15.5)

## 2013-03-16 LAB — LIPID PANEL
Cholesterol: 133 mg/dL (ref 0–200)
HDL: 30 mg/dL — ABNORMAL LOW (ref >39)
Triglycerides: 408 mg/dL — ABNORMAL HIGH (ref <150)

## 2013-03-16 LAB — COMPREHENSIVE METABOLIC PANEL
ALT: 8 U/L (ref 0–35)
AST: 12 U/L (ref 0–37)
Creat: 2.91 mg/dL — ABNORMAL HIGH (ref 0.50–1.10)
Total Bilirubin: 0.3 mg/dL (ref 0.3–1.2)

## 2013-03-16 LAB — FERRITIN: Ferritin: 40 ng/mL (ref 10–291)

## 2013-03-16 LAB — IRON AND TIBC: TIBC: 311 ug/dL (ref 250–470)

## 2013-03-16 NOTE — Progress Notes (Signed)
[name], [name]  [id number]   [phone]  Subjective:    Patient ID: [name], female    DOB: [birth year], 57 y.o.   MRN: [id number]  HPI This 57 y.o. female presents for Welcome to Medicare physical.    Last physical 08/27/11. Pap smear [name] before hysterectomy; letter sent; time for repeat pap.  [name]/GYN. Mammogram 12/2012 per [name]. Colonoscopy never; refuses.  Does not want one. TDAP 08/27/11. Pneumovax 08/27/11.   Flu vaccine 2013.   Hepatitis B series never.   Eye exam due yesterday; [name]; one year ago.  No glaucoma; +cataract early.  +glasses Dental exam unknown; five years ago.  No dentures.     Anemia:  Ferrous sulfate 325mg  two daily since last visit.  No previous colonoscopy and has refused. Has developed diarrhea since 10/2012.  DMII: HgbA1c 7.5 at last visit 12/2012; started Januvia but stopped it due to diarrhea.  Compliance with Metformin bid. Not checking sugars.  Weight down 30 pounds since 07/2012.  Diarrhea:  Onset in 10/2012.  Does not have daily stools; watery diarrhea large volumes every other day on average.  No bloody stools.  Some mucousy stools.  Hospitalized in fall 2013 with new onset CHF.  No previous colonoscopy.  Stopped Januvia because made diarrhea worse. Takes Metformin bid.  CHF:  Repeat echo 40% last week; cardiology d/c digoxin and increased Hydralazine 50mg  tid.  Baseline weight of 250; weight down thirty pounds from 07/2012.  Peripheral Neuropathy: stopped Flexeril due to contraindication with CHF; started Skelaxin. Walks around barefooted at home. Ambulating better; able to get out of chair without assistance.   Review of Systems  Constitutional: Negative for fever, chills, diaphoresis, activity change, appetite change, fatigue and unexpected weight change.  HENT: Negative for hearing loss, ear pain, nosebleeds, congestion, facial swelling, rhinorrhea, sneezing, neck pain, neck stiffness, postnasal drip,  tinnitus and ear discharge.   Eyes: Negative for photophobia, pain, discharge, redness, itching and visual disturbance.  Respiratory: Positive for shortness of breath. Negative for cough, choking, chest tightness, wheezing and stridor.   Cardiovascular: Negative for chest pain, palpitations and leg swelling.  Endocrine: Negative for cold intolerance, heat intolerance, polydipsia, polyphagia and polyuria.  Genitourinary: Negative for dysuria, urgency, frequency, hematuria, flank pain, decreased urine volume, vaginal bleeding, vaginal discharge, enuresis, genital sores, pelvic pain and dyspareunia.  Allergic/Immunologic: Negative for environmental allergies, food allergies and immunocompromised state.  Neurological: Positive for numbness. Negative for dizziness, tremors, seizures, syncope, facial asymmetry, speech difficulty, weakness, light-headedness and headaches.  Hematological: Negative for adenopathy. Does not bruise/bleed easily.  Psychiatric/Behavioral: Negative for suicidal ideas, hallucinations, behavioral problems, confusion, sleep disturbance, self-injury, dysphoric mood, decreased concentration and agitation. The patient is not nervous/anxious and is not hyperactive.     Past Medical History  Diagnosis Date  . Pure hypercholesterolemia   . Obesity, unspecified   . Unspecified vitamin D deficiency   . Pure hypercholesterolemia   . Type II or unspecified type diabetes mellitus without mention of complication, not stated as uncontrolled   . Elevated blood pressure reading without diagnosis of hypertension   . Neuropathy 2011  . Chicken pox   . Coronary artery disease   . CHF (congestive heart failure) 07/19/2012    EF 15% by cardiac catheterization.  Admission.  . Myocardial infarct 07/19/12    s/p cardiac catheterization with stenting to LAD.  Marland Kitchen Hypertension   . Anemia     Past Surgical History  Procedure Laterality Date  .  Lumbar back surgery  1996    L4-5  . Groin  abscess  2007    I & D with ICU stay due to sepsis.  . Gallbladder surgery  2011  . Total abdominal hysterectomy  01/2011  . Bilateral oophorectomy  01/2011    ovarian cyst benign  . Admission  07/19/12    new onset systolic CHF; elevated troponins.  Cardiac catheterization with stenting to LAD; EF 15%.  2D-echo: EF 20-25%.  . Cholecystectomy    . Tubal ligation    . Cardiac catheterization Left     Prior to Admission medications   Medication Sig Start Date End Date Taking? Authorizing Provider  aspirin EC 81 MG tablet Take 1 tablet (81 mg total) by mouth daily. 12/10/12  Yes Jolaine Artist, MD  carvedilol (COREG) 12.5 MG tablet Take 1.5 tablets (18.75 mg total) by mouth 2 (two) times daily with a meal. 12/22/12  Yes Jolaine Artist, MD  gabapentin (NEURONTIN) 600 MG tablet Take 1 tablet (600 mg total) by mouth 3 (three) times daily. 12/07/12  Yes Wardell Honour, MD  glimepiride (AMARYL) 4 MG tablet Take 1 tablet (4 mg total) by mouth 2 (two) times daily before a meal. 12/07/12  Yes Wardell Honour, MD  hydrALAZINE (APRESOLINE) 50 MG tablet Take 1 tablet (50 mg total) by mouth 3 (three) times daily. 03/11/13  Yes Jolaine Artist, MD  isosorbide mononitrate (IMDUR) 30 MG 24 hr tablet Take 1 tablet (30 mg total) by mouth daily. 12/10/12  Yes Jolaine Artist, MD  metaxalone (SKELAXIN) 800 MG tablet Take 1 tablet (800 mg total) by mouth at bedtime as needed for pain. 12/22/12  Yes Wardell Honour, MD  metFORMIN (GLUCOPHAGE) 1000 MG tablet Take 1 tablet (1,000 mg total) by mouth 2 (two) times daily with a meal. 12/07/12  Yes Wardell Honour, MD  nitroGLYCERIN (NITROSTAT) 0.4 MG SL tablet Place 1 tablet (0.4 mg total) under the tongue every 5 (five) minutes as needed for chest pain. 07/26/12  Yes Coral Spikes, DO  nortriptyline (PAMELOR) 50 MG capsule Take 1 capsule (50 mg total) by mouth at bedtime. 12/07/12  Yes Wardell Honour, MD  potassium chloride SA (K-DUR,KLOR-CON) 20 MEQ tablet Take 20 mEq by  mouth daily as needed. Take only when take Torsemide   Yes Historical Provider, MD  sitaGLIPtin (JANUVIA) 50 MG tablet Take 1 tablet (50 mg total) by mouth daily. 12/22/12  Yes Wardell Honour, MD  Ticagrelor (BRILINTA) 90 MG TABS tablet Take 1 tablet (90 mg total) by mouth 2 (two) times daily. 08/17/12  Yes Jolaine Artist, MD  torsemide (DEMADEX) 20 MG tablet Take 20 mg by mouth daily as needed.   Yes Historical Provider, MD  pravastatin (PRAVACHOL) 40 MG tablet Take 1 tablet (40 mg total) by mouth at bedtime. 04/20/13   Jolaine Artist, MD  traMADol (ULTRAM) 50 MG tablet Take 1 tablet (50 mg total) by mouth every 6 (six) hours as needed. For pain 04/06/13   Wardell Honour, MD    No Known Allergies  History   Social History  . Marital Status: Married    Spouse Name: N/A    Number of Children: 2  . Years of Education: 12   Occupational History  . disabled     03/2010 for peripheral neuropathy  . home daycare     x 20 yrs.   Social History Main Topics  . Smoking status: Never Smoker   .  Smokeless tobacco: Not on file  . Alcohol Use: No  . Drug Use: No  . Sexually Active: Not on file   Other Topics Concern  . Not on file   Social History Narrative   Marital status:Married x 40 yrs. Happily married, no abuse.       Children:  2 children (daughter 35, son 80). One grandson and 2 step grandchildren. Daughter and grandson live in home with pt and spouse.       Lives: with husband, daughter, grandson.      Employment: disability for peripheral neuropathy 2012.       Tobacco: none      Alcohol: none      Drugs: none      Exercise: none      Pets: dog.      Always uses seat belts, smoke detectors in home.      No guns in the home. Caffeine use: 2 cups coffee per day.       Nutrition: Well balanced diet.    Family History  Problem Relation Age of Onset  . Diabetes Mother   . Hypertension Mother   . Arthritis Mother     knees, lumbar DDD, cervical DDD  . Cancer Father      prostate,skin,lymphoma.  . Obesity Brother   . Diabetes Maternal Grandmother   . Diabetes Paternal Grandmother        Objective:   Physical Exam  Nursing note and vitals reviewed. Constitutional: She is oriented to person, place, and time. She appears well-developed and well-nourished. No distress.  HENT:  Head: Normocephalic and atraumatic.  Right Ear: External ear normal.  Left Ear: External ear normal.  Nose: Nose normal.  Mouth/Throat: Oropharynx is clear and moist.  Eyes: Conjunctivae and EOM are normal. Pupils are equal, round, and reactive to light.  Neck: Normal range of motion. Neck supple. No JVD present. No tracheal deviation present. No thyromegaly present.  Cardiovascular: Normal rate, regular rhythm, normal heart sounds and intact distal pulses.  Exam reveals no gallop and no friction rub.   No murmur heard. Pulmonary/Chest: Effort normal and breath sounds normal. No respiratory distress. She has no wheezes. She has no rales. She exhibits no tenderness.  Abdominal: Soft. Bowel sounds are normal. She exhibits no distension and no mass. There is no tenderness. There is no rebound and no guarding.  Lymphadenopathy:    She has no cervical adenopathy.  Neurological: She is alert and oriented to person, place, and time. She has normal reflexes. No cranial nerve deficit. She exhibits normal muscle tone. Coordination normal.  Skin: Skin is warm and dry. No rash noted. She is not diaphoretic. No erythema. No pallor.  Psychiatric: She has a normal mood and affect. Her behavior is normal. Judgment and thought content normal.        Assessment & Plan:  Routine general medical examination at a health care facility - Plan: CBC with Differential, Comprehensive metabolic panel, Lipid panel, TSH, POCT urinalysis dipstick, IFOBT POC (occult bld, rslt in office), Hepatitis B vaccine adult IM  Type II or unspecified type diabetes mellitus without mention of complication, not stated as  uncontrolled - Plan: Hemoglobin A1c, Microalbumin, urine  Pure hypercholesterolemia - Plan: Lipid panel  Essential hypertension, benign - Plan: Comprehensive metabolic panel, TSH  Iron deficiency anemia, unspecified - Plan: Iron and TIBC, Ferritin  Diarrhea - Plan: Clostridium difficile toxin, Stool culture, IFOBT POC (occult bld, rslt in office), Clostridium difficile toxin, Stool culture  1.  Welcome to Medicare CPE:  Anticipatory guidance provided --- weight loss, exercise.  Pap smear due; mammogram UTD per [name].  Refuses Colonsocopy; hemosure obtained in office.  Immunizations UTD; s/p Hepatitis B#1 in office due to DMII.  Independent with ADLs. No evidence of depression ;no hearing loss.  Moderate fall risk due to PN.  Full code. 2.  DMII: moderately controlled; obtain urine microalbumin; monofilament abnormal; known peripheral neuropathy.  S/p Hepatitis B#1.   3.  Hypercholesterolemia: controlled; obtain labs; continue current medication. 4.  CHF: controlled; obtain labs; no change in medications.  Managed by cardiology. 5. Iron deficiency anemia:  New.  Obtain hemosure; patient refused colonoscopy but highly encourage with recent onset of anemia and diarrhea. 6.  Diarrhea; New onset; duration four months; advised to HOLD Metformin. . Obtain stool cultures and C. Difficile toxin. If stool studies negative and holding Metformin not helpful, will warrant colonoscopy.   7. S/p Hepatitis B#1: RTC 3 months for Hepatitis B#2.  No orders of the defined types were placed in this encounter.

## 2013-03-16 NOTE — Patient Instructions (Addendum)
1.  Turn in stool studies after collecting stool at home. 2.  Hold Metformin for one week and see if diarrhea improves; call Dr. Tamala Julian with update on diarrhea after holding Metformin.

## 2013-03-17 LAB — MICROALBUMIN, URINE: Microalb, Ur: 4.43 mg/dL — ABNORMAL HIGH (ref 0.00–1.89)

## 2013-03-24 ENCOUNTER — Ambulatory Visit (INDEPENDENT_AMBULATORY_CARE_PROVIDER_SITE_OTHER): Payer: Medicare Other | Admitting: Family Medicine

## 2013-03-24 ENCOUNTER — Encounter: Payer: Self-pay | Admitting: Family Medicine

## 2013-03-24 VITALS — BP 118/64 | HR 74 | Temp 98.3°F | Resp 16 | Ht 67.25 in | Wt 262.0 lb

## 2013-03-24 DIAGNOSIS — I5022 Chronic systolic (congestive) heart failure: Secondary | ICD-10-CM

## 2013-03-24 DIAGNOSIS — E669 Obesity, unspecified: Secondary | ICD-10-CM

## 2013-03-24 DIAGNOSIS — N179 Acute kidney failure, unspecified: Secondary | ICD-10-CM

## 2013-03-24 DIAGNOSIS — D509 Iron deficiency anemia, unspecified: Secondary | ICD-10-CM

## 2013-03-24 DIAGNOSIS — R197 Diarrhea, unspecified: Secondary | ICD-10-CM

## 2013-03-24 LAB — STOOL CULTURE

## 2013-03-24 LAB — POCT URINALYSIS DIPSTICK
Blood, UA: NEGATIVE
Glucose, UA: NEGATIVE
Nitrite, UA: POSITIVE

## 2013-03-24 NOTE — Progress Notes (Signed)
[place], [place]  [id number]   [phone]  Subjective:    Patient ID: [name], female    DOB: [birth year], 57 y.o.   MRN: [id number]  HPI This 57 y.o. female presents for evaluation of the following:  1. Anemia: worsened since last visit; advised to increase ferrous sulfate to tid.  Hemosure negative at visit for CPE last week.  Has not increased iron to tid yet.    2.  Acute renal failure:  Creatinine of 2.9 at visit last week; presenting for repeat labs. Feels great; denies fatigue, nausea, weight gain, leg swelling, orthopnea.  Has taken diuretic once in past week.  3. Diarrhea:  Stool cultures and C. Difficile pending.  Advised to HOLD Metformin for one week.  Diarrhea has resolved since holding Metformin.  No further diarrhea.    4.  CHF: second cardiologist felt that EF only 30$; recommending defibrillator if EF 30%; trying to schedule echo MRI of heart.  No medication changes.     Review of Systems  Constitutional: Positive for fatigue. Negative for fever, chills and diaphoresis.  Respiratory: Negative for cough, shortness of breath and wheezing.   Cardiovascular: Negative for chest pain, palpitations and leg swelling.  Gastrointestinal: Negative for nausea, vomiting, abdominal pain and diarrhea.   Past Medical History  Diagnosis Date  . Pure hypercholesterolemia   . Obesity, unspecified   . Unspecified vitamin D deficiency   . Pure hypercholesterolemia   . Type II or unspecified type diabetes mellitus without mention of complication, not stated as uncontrolled   . Elevated blood pressure reading without diagnosis of hypertension   . Neuropathy 2011  . Chicken pox   . Coronary artery disease   . CHF (congestive heart failure) 07/19/2012    EF 15% by cardiac catheterization.  Admission.  . Myocardial infarct 07/19/12    s/p cardiac catheterization with stenting to LAD.  Marland Kitchen Hypertension   . Anemia    Current Outpatient Prescriptions on File Prior  to Visit  Medication Sig Dispense Refill  . aspirin EC 81 MG tablet Take 1 tablet (81 mg total) by mouth daily.  90 tablet  3  . carvedilol (COREG) 12.5 MG tablet Take 1.5 tablets (18.75 mg total) by mouth 2 (two) times daily with a meal.  270 tablet  3  . gabapentin (NEURONTIN) 600 MG tablet Take 1 tablet (600 mg total) by mouth 3 (three) times daily.  270 tablet  1  . glimepiride (AMARYL) 4 MG tablet Take 1 tablet (4 mg total) by mouth 2 (two) times daily before a meal.  180 tablet  3  . hydrALAZINE (APRESOLINE) 50 MG tablet Take 1 tablet (50 mg total) by mouth 3 (three) times daily.  90 tablet  3  . isosorbide mononitrate (IMDUR) 30 MG 24 hr tablet Take 1 tablet (30 mg total) by mouth daily.  90 tablet  3  . metaxalone (SKELAXIN) 800 MG tablet Take 1 tablet (800 mg total) by mouth at bedtime as needed for pain.  90 tablet  1  . metFORMIN (GLUCOPHAGE) 1000 MG tablet Take 1 tablet (1,000 mg total) by mouth 2 (two) times daily with a meal.  180 tablet  3  . nitroGLYCERIN (NITROSTAT) 0.4 MG SL tablet Place 1 tablet (0.4 mg total) under the tongue every 5 (five) minutes as needed for chest pain.  30 tablet  0  . nortriptyline (PAMELOR) 50 MG capsule Take 1 capsule (50 mg total) by mouth  at bedtime.  90 capsule  3  . potassium chloride SA (K-DUR,KLOR-CON) 20 MEQ tablet Take 20 mEq by mouth daily as needed. Take only when take Torsemide      . pravastatin (PRAVACHOL) 40 MG tablet Take 1 tablet (40 mg total) by mouth at bedtime.  90 tablet  3  . sitaGLIPtin (JANUVIA) 50 MG tablet Take 1 tablet (50 mg total) by mouth daily.  90 tablet  1  . Ticagrelor (BRILINTA) 90 MG TABS tablet Take 1 tablet (90 mg total) by mouth 2 (two) times daily.  180 tablet  3  . torsemide (DEMADEX) 20 MG tablet Take 20 mg by mouth daily as needed.      . traMADol (ULTRAM) 50 MG tablet Take 1 tablet (50 mg total) by mouth every 6 (six) hours as needed. For pain  180 tablet  1   No current facility-administered medications on  file prior to visit.      Objective:   Physical Exam  Nursing note and vitals reviewed. Constitutional: She is oriented to person, place, and time. She appears well-developed and well-nourished. No distress.  HENT:  Head: Normocephalic and atraumatic.  Mouth/Throat: Oropharynx is clear and moist.  Neck: Normal range of motion. Neck supple. No JVD present. No thyromegaly present.  Cardiovascular: Normal rate, regular rhythm and normal heart sounds.  Exam reveals no gallop and no friction rub.   No murmur heard. Pulmonary/Chest: Effort normal and breath sounds normal. No respiratory distress. She has no wheezes. She has no rales. She exhibits no tenderness.  Lymphadenopathy:    She has no cervical adenopathy.  Neurological: She is alert and oriented to person, place, and time.  Skin: She is not diaphoretic.  Psychiatric: She has a normal mood and affect. Her behavior is normal.   Results for orders placed in visit on 03/24/13  POCT URINALYSIS DIPSTICK      Result Value Range   Color, UA yellow     Clarity, UA clear     Glucose, UA neg     Bilirubin, UA small     Ketones, UA trace     Spec Grav, UA 1.020     Blood, UA neg     pH, UA 6.0     Protein, UA neg     Urobilinogen, UA 0.2     Nitrite, UA pos     Leukocytes, UA small (1+)         Assessment & Plan:  Iron deficiency anemia, unspecified - Plan: CBC with Differential, Basic metabolic panel, POCT urinalysis dipstick  Acute renal failure - Plan: CBC with Differential, Basic metabolic panel  Diarrhea  Anemia, iron deficiency   1.  Iron deficiency anemia: persistent; advised to increase iron to tid; hemosure negative in office last visit. 2.  Acute renal failure; New.  Asymptomatic; repeat today.  If remains elevated, refer for renal ultrasound and for nephrology consultation. 3.  Diarrhea: improved with holding Metformin; stool studies pending; advised to add back Metformin at 1/2 bid. 4.  DMII: stable; advised to  restart Metformin 1000mg  but to decrease to 1/2 q d x 2 weeks and then increase to 1/2 bid if tolerates; restart Januvia.   5.  CHF: systolic; recent EF of [id number]; to undergo further imaging; may warrant defibrillator.

## 2013-03-24 NOTE — Patient Instructions (Addendum)
1.  Restart Januvia one daily. 2.  Restart Metformin 1/2 tablet with meal for two weeks; then if tolerating without diarrhea, increase to 1/2 tablet twice daily.   3.  If kidney function still elevated, we will order kidney ultrasound and refer to kidney specialist.   4.  Increase iron to three tablets daily.

## 2013-03-25 LAB — BASIC METABOLIC PANEL
CO2: 30 mEq/L (ref 19–32)
Calcium: 8.7 mg/dL (ref 8.4–10.5)
Chloride: 101 mEq/L (ref 96–112)
Potassium: 4.8 mEq/L (ref 3.5–5.3)
Sodium: 136 mEq/L (ref 135–145)

## 2013-03-25 LAB — CBC WITH DIFFERENTIAL/PLATELET
Hemoglobin: 10.9 g/dL — ABNORMAL LOW (ref 12.0–15.0)
Lymphocytes Relative: 24 % (ref 12–46)
Lymphs Abs: 1.7 10*3/uL (ref 0.7–4.0)
Neutro Abs: 4.9 10*3/uL (ref 1.7–7.7)
Neutrophils Relative %: 67 % (ref 43–77)
Platelets: 210 10*3/uL (ref 150–400)
RBC: 4 MIL/uL (ref 3.87–5.11)
WBC: 7.2 10*3/uL (ref 4.0–10.5)

## 2013-03-31 LAB — CLOSTRIDIUM DIFFICILE CULTURE-FECAL

## 2013-03-31 LAB — CLOSTRIDIUM DIFFICILE TOXIN

## 2013-04-06 ENCOUNTER — Other Ambulatory Visit: Payer: Self-pay | Admitting: Radiology

## 2013-04-06 DIAGNOSIS — E1142 Type 2 diabetes mellitus with diabetic polyneuropathy: Secondary | ICD-10-CM

## 2013-04-06 MED ORDER — TRAMADOL HCL 50 MG PO TABS: 50.0000 mg | ORAL_TABLET | Freq: Four times a day (QID) | ORAL | Status: DC | PRN

## 2013-04-06 NOTE — Telephone Encounter (Signed)
Patients pharmacy requesting refill on the Tramadol. Patient has gotten refill in May , [facility]

## 2013-04-19 ENCOUNTER — Encounter: Payer: Self-pay | Admitting: Family Medicine

## 2013-04-19 ENCOUNTER — Ambulatory Visit (INDEPENDENT_AMBULATORY_CARE_PROVIDER_SITE_OTHER): Payer: Medicare Other | Admitting: Family Medicine

## 2013-04-19 VITALS — BP 110/70 | HR 79 | Temp 97.8°F | Resp 16 | Ht 67.5 in | Wt 260.8 lb

## 2013-04-19 DIAGNOSIS — I509 Heart failure, unspecified: Secondary | ICD-10-CM

## 2013-04-19 DIAGNOSIS — N289 Disorder of kidney and ureter, unspecified: Secondary | ICD-10-CM

## 2013-04-19 DIAGNOSIS — D509 Iron deficiency anemia, unspecified: Secondary | ICD-10-CM

## 2013-04-19 DIAGNOSIS — E119 Type 2 diabetes mellitus without complications: Secondary | ICD-10-CM

## 2013-04-19 DIAGNOSIS — E1169 Type 2 diabetes mellitus with other specified complication: Secondary | ICD-10-CM

## 2013-04-19 LAB — POCT CBC
HCT, POC: 38.4 % (ref 37.7–47.9)
Lymph, poc: 1.6 (ref 0.6–3.4)
MCHC: 31.3 g/dL — AB (ref 31.8–35.4)
MCV: 90.3 fL (ref 80–97)
POC Granulocyte: 5.4 (ref 2–6.9)
POC LYMPH PERCENT: 21.8 %L (ref 10–50)
RDW, POC: 15.2 %

## 2013-04-19 NOTE — Assessment & Plan Note (Signed)
Stable; tolerating lower dose of Metformin without diarrhea; tolerating Januvia without diarrhea.

## 2013-04-19 NOTE — Assessment & Plan Note (Signed)
Improving with tid ferrous sulfate; continue tid ferrous sulfate for the next three months.  Hemosure negative at last visit.  No previous colonoscopy.

## 2013-04-19 NOTE — Assessment & Plan Note (Signed)
Improved; repeat BUN/Cr today.  Asymptomatic.

## 2013-04-19 NOTE — Progress Notes (Signed)
[place], [place]  [id number]   [phone]  Subjective:    Patient ID: [name], female    DOB: [birth year], 57 y.o.   MRN: [id number]  HPI This 57 y.o. female presents for evaluation of the following:  1.  Anemia: one month follow-up; management made at last visit included increasing ferrous sulfate to 325mg  tid.  Feeling well. Denies n/v/d/c; denies bloody stools or melanotic stools. Denies fatigue.  2.  Acute renal insufficiency:  Creatinine improved to 1.2 at last visit.  Urine normal.  3. DMII:  Diarrhea improved with holding Metformin; restarted Metformin 1/2 bid; no diarrhea.  Restarted Januvia.  No diarrhea.    4.  CHF:  Has not heard from cardiologist yet regarding MRI.  Follow-up with cardiology in 06/2013.  Did suffer with swelling in past week; had to take diuretic with improvement.   Review of Systems  Constitutional: Negative for fever, chills, diaphoresis and fatigue.  Respiratory: Negative for shortness of breath, wheezing and stridor.   Cardiovascular: Positive for leg swelling. Negative for chest pain and palpitations.  Gastrointestinal: Negative for nausea, vomiting, abdominal pain, diarrhea, constipation, blood in stool, abdominal distention, anal bleeding and rectal pain.  Endocrine: Negative for cold intolerance, heat intolerance, polydipsia, polyphagia and polyuria.  Hematological: Negative for adenopathy. Does not bruise/bleed easily.   Past Medical History  Diagnosis Date  . Pure hypercholesterolemia   . Obesity, unspecified   . Unspecified vitamin D deficiency   . Pure hypercholesterolemia   . Type II or unspecified type diabetes mellitus without mention of complication, not stated as uncontrolled   . Elevated blood pressure reading without diagnosis of hypertension   . Neuropathy 2011  . Chicken pox   . Coronary artery disease   . CHF (congestive heart failure) 07/19/2012    EF 15% by cardiac catheterization.  Admission.  .  Myocardial infarct 07/19/12    s/p cardiac catheterization with stenting to LAD.  Marland Kitchen Hypertension   . Anemia    Current Outpatient Prescriptions on File Prior to Visit  Medication Sig Dispense Refill  . aspirin EC 81 MG tablet Take 1 tablet (81 mg total) by mouth daily.  90 tablet  3  . carvedilol (COREG) 12.5 MG tablet Take 1.5 tablets (18.75 mg total) by mouth 2 (two) times daily with a meal.  270 tablet  3  . gabapentin (NEURONTIN) 600 MG tablet Take 1 tablet (600 mg total) by mouth 3 (three) times daily.  270 tablet  1  . glimepiride (AMARYL) 4 MG tablet Take 1 tablet (4 mg total) by mouth 2 (two) times daily before a meal.  180 tablet  3  . hydrALAZINE (APRESOLINE) 50 MG tablet Take 1 tablet (50 mg total) by mouth 3 (three) times daily.  90 tablet  3  . isosorbide mononitrate (IMDUR) 30 MG 24 hr tablet Take 1 tablet (30 mg total) by mouth daily.  90 tablet  3  . metaxalone (SKELAXIN) 800 MG tablet Take 1 tablet (800 mg total) by mouth at bedtime as needed for pain.  90 tablet  1  . metFORMIN (GLUCOPHAGE) 1000 MG tablet Take 1 tablet (1,000 mg total) by mouth 2 (two) times daily with a meal.  180 tablet  3  . nitroGLYCERIN (NITROSTAT) 0.4 MG SL tablet Place 1 tablet (0.4 mg total) under the tongue every 5 (five) minutes as needed for chest pain.  30 tablet  0  . nortriptyline (PAMELOR) 50 MG capsule Take  1 capsule (50 mg total) by mouth at bedtime.  90 capsule  3  . potassium chloride SA (K-DUR,KLOR-CON) 20 MEQ tablet Take 20 mEq by mouth daily as needed. Take only when take Torsemide      . pravastatin (PRAVACHOL) 40 MG tablet Take 1 tablet (40 mg total) by mouth at bedtime.  90 tablet  3  . sitaGLIPtin (JANUVIA) 50 MG tablet Take 1 tablet (50 mg total) by mouth daily.  90 tablet  1  . Ticagrelor (BRILINTA) 90 MG TABS tablet Take 1 tablet (90 mg total) by mouth 2 (two) times daily.  180 tablet  3  . torsemide (DEMADEX) 20 MG tablet Take 20 mg by mouth daily as needed.      . traMADol  (ULTRAM) 50 MG tablet Take 1 tablet (50 mg total) by mouth every 6 (six) hours as needed. For pain  180 tablet  1   No current facility-administered medications on file prior to visit.       Objective:   Physical Exam  Nursing note and vitals reviewed. Constitutional: She is oriented to person, place, and time. She appears well-developed and well-nourished. No distress.  Cardiovascular: Normal rate, regular rhythm and normal heart sounds.  Exam reveals no gallop and no friction rub.   No murmur heard. Pulmonary/Chest: Effort normal and breath sounds normal. She has no wheezes. She has no rales.  Neurological: She is alert and oriented to person, place, and time.  Skin: Skin is warm and dry. No rash noted. She is not diaphoretic.  Psychiatric: She has a normal mood and affect. Her behavior is normal.      Results for orders placed in visit on 04/19/13  POCT CBC      Result Value Range   WBC 7.4  4.6 - 10.2 K/uL   Lymph, poc 1.6  0.6 - 3.4   POC LYMPH PERCENT 21.8  10 - 50 %L   MID (cbc) 0.4  0 - 0.9   POC MID % 5.2  0 - 12 %M   POC Granulocyte 5.4  2 - 6.9   Granulocyte percent 73.0  37 - 80 %G   RBC 4.25  4.04 - 5.48 M/uL   Hemoglobin 12.0 (*) 12.2 - 16.2 g/dL   HCT, POC 38.4  37.7 - 47.9 %   MCV 90.3  80 - 97 fL   MCH, POC 28.2  27 - 31.2 pg   MCHC 31.3 (*) 31.8 - 35.4 g/dL   RDW, POC 15.2     Platelet Count, POC 200  142 - 424 K/uL   MPV 8.9  0 - 99.8 fL    Assessment & Plan:  Iron deficiency anemia, unspecified - Plan: POCT CBC  Renal insufficiency - Plan: Basic metabolic panel  Diabetes mellitus type 2 in obese  Acute CHF (congestive heart failure)

## 2013-04-19 NOTE — Assessment & Plan Note (Signed)
Stable; euvolemic today.

## 2013-04-20 ENCOUNTER — Telehealth: Payer: Self-pay

## 2013-04-20 ENCOUNTER — Other Ambulatory Visit (HOSPITAL_COMMUNITY): Payer: Self-pay | Admitting: *Deleted

## 2013-04-20 DIAGNOSIS — E7800 Pure hypercholesterolemia, unspecified: Secondary | ICD-10-CM

## 2013-04-20 LAB — BASIC METABOLIC PANEL
BUN: 25 mg/dL — ABNORMAL HIGH (ref 6–23)
Calcium: 8.8 mg/dL (ref 8.4–10.5)
Glucose, Bld: 155 mg/dL — ABNORMAL HIGH (ref 70–99)
Sodium: 136 mEq/L (ref 135–145)

## 2013-04-20 MED ORDER — PRAVASTATIN SODIUM 40 MG PO TABS: 40.0000 mg | ORAL_TABLET | Freq: Every day | ORAL | Status: DC

## 2013-04-20 NOTE — Telephone Encounter (Signed)
Please advise on Januvia. Patient advised about Skelaxin x3 already she could not take Flexeril with CHF< will remind her of this when I speak to her about the [place].

## 2013-04-20 NOTE — Telephone Encounter (Signed)
Pt states that she JANUVIA is too expensive for her and would like to know if there are any different options available, also pt states that someone mentions changing her muscle relaxer from metaxalone (SKELAXIN) 800 MG tablet but she does not recall this change occuring. Best# [phone]

## 2013-04-21 NOTE — Telephone Encounter (Signed)
Left message for call back.

## 2013-04-21 NOTE — Telephone Encounter (Signed)
Januvia is the only pill option that we have unfortunately; the other option is to stop Januvia and start Lantus insulin.  Which would she prefer to do?

## 2013-04-23 NOTE — Telephone Encounter (Signed)
Form completed for patient, to try to help with cost of Januvia

## 2013-04-23 NOTE — Telephone Encounter (Signed)
Current Outpatient Prescriptions on File Prior to Visit   Medication  Sig  Dispense  Refill   .  aspirin EC 81 MG tablet  Take 1 tablet (81 mg total) by mouth daily.  90 tablet  3   .  carvedilol (COREG) 12.5 MG tablet  Take 1.5 tablets (18.75 mg total) by mouth 2 (two) times daily with a meal.  270 tablet  3   .  gabapentin (NEURONTIN) 600 MG tablet  Take 1 tablet (600 mg total) by mouth 3 (three) times daily.  270 tablet  1   .  glimepiride (AMARYL) 4 MG tablet  Take 1 tablet (4 mg total) by mouth 2 (two) times daily before a meal.  180 tablet  3   .  hydrALAZINE (APRESOLINE) 50 MG tablet  Take 1 tablet (50 mg total) by mouth 3 (three) times daily.  90 tablet  3   .  isosorbide mononitrate (IMDUR) 30 MG 24 hr tablet  Take 1 tablet (30 mg total) by mouth daily.  90 tablet  3   .  metaxalone (SKELAXIN) 800 MG tablet  Take 1 tablet (800 mg total) by mouth at bedtime as needed for pain.  90 tablet  1   .  metFORMIN (GLUCOPHAGE) 1000 MG tablet  Take 1 tablet (1,000 mg total) by mouth 2 (two) times daily with a meal.  180 tablet  3   .  nitroGLYCERIN (NITROSTAT) 0.4 MG SL tablet  Place 1 tablet (0.4 mg total) under the tongue every 5 (five) minutes as needed for chest pain.  30 tablet  0   .  nortriptyline (PAMELOR) 50 MG capsule  Take 1 capsule (50 mg total) by mouth at bedtime.  90 capsule  3   .  potassium chloride SA (K-DUR,KLOR-CON) 20 MEQ tablet  Take 20 mEq by mouth daily as needed. Take only when take Torsemide     .  pravastatin (PRAVACHOL) 40 MG tablet  Take 1 tablet (40 mg total) by mouth at bedtime.  90 tablet  3   .  sitaGLIPtin (JANUVIA) 50 MG tablet  Take 1 tablet (50 mg total) by mouth daily.  90 tablet  1   .  Ticagrelor (BRILINTA) 90 MG TABS tablet  Take 1 tablet (90 mg total) by mouth 2 (two) times daily.  180 tablet  3   .  torsemide (DEMADEX) 20 MG tablet  Take 20 mg by mouth daily as needed.     .  traMADol (ULTRAM) 50 MG tablet  Take 1 tablet (50 mg total) by mouth every 6 (six)  hours as needed. For pain  180 tablet  1    No current facility-administered medications on file prior to visit.

## 2013-04-27 ENCOUNTER — Telehealth: Payer: Self-pay

## 2013-04-27 NOTE — Telephone Encounter (Signed)
Telephone call from [facility] to verify medications for patient.  Spoke with Dr. Tamala Julian.  Gabapentin 600 mg 1 tab TID #270 1 RF.

## 2013-04-28 ENCOUNTER — Other Ambulatory Visit: Payer: Self-pay | Admitting: *Deleted

## 2013-04-28 DIAGNOSIS — I509 Heart failure, unspecified: Secondary | ICD-10-CM

## 2013-04-28 DIAGNOSIS — I5021 Acute systolic (congestive) heart failure: Secondary | ICD-10-CM

## 2013-04-29 DIAGNOSIS — I10 Essential (primary) hypertension: Secondary | ICD-10-CM | POA: Insufficient documentation

## 2013-04-29 DIAGNOSIS — R197 Diarrhea, unspecified: Secondary | ICD-10-CM | POA: Insufficient documentation

## 2013-05-03 ENCOUNTER — Ambulatory Visit (HOSPITAL_COMMUNITY): Admission: RE | Admit: 2013-05-03 | Payer: Medicare Other | Source: Ambulatory Visit

## 2013-05-17 DIAGNOSIS — Z0271 Encounter for disability determination: Secondary | ICD-10-CM

## 2013-05-18 ENCOUNTER — Telehealth (HOSPITAL_COMMUNITY): Payer: Self-pay | Admitting: *Deleted

## 2013-05-18 NOTE — Telephone Encounter (Signed)
Pt called to report her BP is 74/44, she feels dizzy and lightheaded at times, she states she started feeling bad yesterday but just checked BP today, she states she only takes her Torsemide as needed and that she hasn't taken it in about a month, she denies, vomiting, diarrhea, fever, bleeding or signs of infections, she states she has taken her medications this AM, she will hold other doses of Hydralazine and Carvedilol for now, discussed with Dr Haroldine Laws he would like for pt to drink extra fluid, if not feeling better call us back to be seen and have labs, called pt back with his recommendations, she is out shopping with her mother but states she is still feeling dizzy she will get some water and call back if not feeling better

## 2013-06-16 ENCOUNTER — Encounter: Payer: Self-pay | Admitting: Family Medicine

## 2013-06-16 ENCOUNTER — Ambulatory Visit (INDEPENDENT_AMBULATORY_CARE_PROVIDER_SITE_OTHER): Payer: Medicare Other | Admitting: Family Medicine

## 2013-06-16 VITALS — BP 130/66 | HR 78 | Temp 97.8°F | Resp 16 | Ht 67.5 in | Wt 253.0 lb

## 2013-06-16 DIAGNOSIS — E119 Type 2 diabetes mellitus without complications: Secondary | ICD-10-CM

## 2013-06-16 DIAGNOSIS — R197 Diarrhea, unspecified: Secondary | ICD-10-CM

## 2013-06-16 DIAGNOSIS — M79609 Pain in unspecified limb: Secondary | ICD-10-CM

## 2013-06-16 DIAGNOSIS — E782 Mixed hyperlipidemia: Secondary | ICD-10-CM

## 2013-06-16 DIAGNOSIS — E7800 Pure hypercholesterolemia, unspecified: Secondary | ICD-10-CM

## 2013-06-16 DIAGNOSIS — D509 Iron deficiency anemia, unspecified: Secondary | ICD-10-CM

## 2013-06-16 DIAGNOSIS — E1142 Type 2 diabetes mellitus with diabetic polyneuropathy: Secondary | ICD-10-CM

## 2013-06-16 DIAGNOSIS — Z23 Encounter for immunization: Secondary | ICD-10-CM

## 2013-06-16 LAB — COMPREHENSIVE METABOLIC PANEL
ALT: 13 U/L (ref 0–35)
CO2: 25 mEq/L (ref 19–32)
Sodium: 138 mEq/L (ref 135–145)
Total Bilirubin: 0.4 mg/dL (ref 0.3–1.2)
Total Protein: 7.6 g/dL (ref 6.0–8.3)

## 2013-06-16 LAB — CBC WITH DIFFERENTIAL/PLATELET
Eosinophils Absolute: 0.2 10*3/uL (ref 0.0–0.7)
Eosinophils Relative: 2 % (ref 0–5)
HCT: 35.4 % — ABNORMAL LOW (ref 36.0–46.0)
Hemoglobin: 11.7 g/dL — ABNORMAL LOW (ref 12.0–15.0)
Lymphs Abs: 1.8 10*3/uL (ref 0.7–4.0)
MCH: 28.1 pg (ref 26.0–34.0)
MCV: 84.9 fL (ref 78.0–100.0)
Monocytes Absolute: 0.5 10*3/uL (ref 0.1–1.0)
Monocytes Relative: 7 % (ref 3–12)
RBC: 4.17 MIL/uL (ref 3.87–5.11)

## 2013-06-16 LAB — IRON AND TIBC
%SAT: 21 % (ref 20–55)
Iron: 69 ug/dL (ref 42–145)

## 2013-06-16 LAB — LIPID PANEL
HDL: 34 mg/dL — ABNORMAL LOW (ref >39)
LDL Cholesterol: 42 mg/dL (ref 0–99)
Total CHOL/HDL Ratio: 3.6 Ratio

## 2013-06-16 LAB — HEMOGLOBIN A1C: Hgb A1c MFr Bld: 6.8 % — ABNORMAL HIGH (ref <5.7)

## 2013-06-16 NOTE — Progress Notes (Signed)
[place], [place]  [id number]   [phone]  Subjective:    Patient ID: [name], female    DOB: 1956-01-05, 57 y.o.   MRN: [id number]  HPI This 57 y.o. female presents for evaluation three month follow-up:  1.  Diarrhea: recurrent; having sporadic diarrhea; compliance with Metformin.  2. DMII: got patient assistance with Januvia.  Took first Januvia this morning.  Not checking sugars.  Started exercising at the [employer].    3.  L toe injury: stumped toe second L two weeks ago; horrible bruising; has always swelled since initial injury.  While in wheelchair, fell and hit L second toe. No pain to L toe due to PN.   4.  Anemia: taking tid ferrous sulfate; ran out for two weeks and just started back.  Denies melena or bloody stools.  5.  Hyperlipidemia:  Mother no longer cooking since last visit; no longer making cakes and pie.  No cake for 2-3 weeks.   6.  Peripheral neuropathy:  Stable.    7. CHF: no follow-up with cardiology; next appointment six months from last visit. No volume overload.   Review of Systems  Constitutional: Negative for fever, chills, diaphoresis and fatigue.  Respiratory: Negative for cough, shortness of breath, wheezing and stridor.   Cardiovascular: Negative for chest pain, palpitations and leg swelling.  Gastrointestinal: Positive for diarrhea. Negative for nausea, vomiting, abdominal pain, constipation, blood in stool, anal bleeding and rectal pain.  Endocrine: Negative for polydipsia, polyphagia and polyuria.  Musculoskeletal: Positive for arthralgias and joint swelling.  Skin: Positive for color change. Negative for pallor, rash and wound.  Neurological: Positive for numbness. Negative for dizziness, tremors, syncope, facial asymmetry, speech difficulty, weakness and headaches.   Past Medical History  Diagnosis Date  . Pure hypercholesterolemia   . Obesity, unspecified   . Unspecified vitamin D deficiency   . Pure  hypercholesterolemia   . Type II or unspecified type diabetes mellitus without mention of complication, not stated as uncontrolled   . Elevated blood pressure reading without diagnosis of hypertension   . Neuropathy 2011  . Chicken pox   . Coronary artery disease   . CHF (congestive heart failure) 07/19/2012    EF 15% by cardiac catheterization.  Admission.  . Myocardial infarct 07/19/12    s/p cardiac catheterization with stenting to LAD.  Marland Kitchen Hypertension   . Anemia    Past Surgical History  Procedure Laterality Date  . Lumbar back surgery  1996    L4-5  . Groin abscess  2007    I & D with ICU stay due to sepsis.  . Gallbladder surgery  2011  . Total abdominal hysterectomy  01/2011  . Bilateral oophorectomy  01/2011    ovarian cyst benign  . Admission  07/19/12    new onset systolic CHF; elevated troponins.  Cardiac catheterization with stenting to LAD; EF 15%.  2D-echo: EF 20-25%.  . Cholecystectomy    . Tubal ligation    . Cardiac catheterization Left    No Known Allergies Current Outpatient Prescriptions on File Prior to Visit  Medication Sig Dispense Refill  . aspirin EC 81 MG tablet Take 1 tablet (81 mg total) by mouth daily.  90 tablet  3  . carvedilol (COREG) 12.5 MG tablet Take 1.5 tablets (18.75 mg total) by mouth 2 (two) times daily with a meal.  270 tablet  3  . gabapentin (NEURONTIN) 600 MG tablet Take 1 tablet (  600 mg total) by mouth 3 (three) times daily.  270 tablet  1  . glimepiride (AMARYL) 4 MG tablet Take 1 tablet (4 mg total) by mouth 2 (two) times daily before a meal.  180 tablet  3  . hydrALAZINE (APRESOLINE) 50 MG tablet Take 1 tablet (50 mg total) by mouth 3 (three) times daily.  90 tablet  3  . isosorbide mononitrate (IMDUR) 30 MG 24 hr tablet Take 1 tablet (30 mg total) by mouth daily.  90 tablet  3  . metFORMIN (GLUCOPHAGE) 1000 MG tablet Take 1 tablet (1,000 mg total) by mouth 2 (two) times daily with a meal.  180 tablet  3  . nitroGLYCERIN  (NITROSTAT) 0.4 MG SL tablet Place 1 tablet (0.4 mg total) under the tongue every 5 (five) minutes as needed for chest pain.  30 tablet  0  . nortriptyline (PAMELOR) 50 MG capsule Take 1 capsule (50 mg total) by mouth at bedtime.  90 capsule  3  . potassium chloride SA (K-DUR,KLOR-CON) 20 MEQ tablet Take 20 mEq by mouth daily as needed. Take only when take Torsemide      . pravastatin (PRAVACHOL) 40 MG tablet Take 1 tablet (40 mg total) by mouth at bedtime.  90 tablet  3  . sitaGLIPtin (JANUVIA) 50 MG tablet Take 1 tablet (50 mg total) by mouth daily.  90 tablet  1  . torsemide (DEMADEX) 20 MG tablet Take 20 mg by mouth daily as needed.      . traMADol (ULTRAM) 50 MG tablet Take 1 tablet (50 mg total) by mouth every 6 (six) hours as needed. For pain  180 tablet  1  . metaxalone (SKELAXIN) 800 MG tablet Take 1 tablet (800 mg total) by mouth at bedtime as needed for pain.  90 tablet  1   No current facility-administered medications on file prior to visit.       Objective:   Physical Exam  Nursing note and vitals reviewed. Constitutional: She is oriented to person, place, and time. She appears well-developed and well-nourished. No distress.  HENT:  Head: Normocephalic and atraumatic.  Mouth/Throat: Oropharynx is clear and moist.  Eyes: Conjunctivae are normal. Pupils are equal, round, and reactive to light.  Neck: Normal range of motion. Neck supple. No JVD present. No thyromegaly present.  Cardiovascular: Normal rate, regular rhythm and normal heart sounds.  Exam reveals no gallop and no friction rub.   No murmur heard. Pulmonary/Chest: Effort normal and breath sounds normal. She has no wheezes. She has no rales.  Abdominal: Soft. Bowel sounds are normal. She exhibits no distension. There is no tenderness. There is no rebound and no guarding.  Musculoskeletal:  L second toe with diffuse swelling,ecchymoses.   Lymphadenopathy:    She has no cervical adenopathy.  Neurological: She is alert  and oriented to person, place, and time. No cranial nerve deficit.  Skin: Skin is warm and dry. No rash noted. She is not diaphoretic. No erythema. No pallor.  L second toe with ecchymoses; no skin break down; no wound.  Psychiatric: She has a normal mood and affect. Her behavior is normal.   INFLUENZA VACCINE ADMINISTERED.    Assessment & Plan:  Type II or unspecified type diabetes mellitus without mention of complication, not stated as uncontrolled - Plan: CBC with Differential, Comprehensive metabolic panel, Hemoglobin A1c  Mixed hyperlipidemia - Plan: CBC with Differential, Comprehensive metabolic panel, Lipid panel  Iron deficiency anemia, unspecified - Plan: Iron and TIBC  Need  for prophylactic vaccination and inoculation against influenza - Plan: Flu Vaccine QUAD 36+ mos IM  Pain in toe, left  Diabetic peripheral neuropathy associated with type 2 diabetes mellitus  Diarrhea   1.  DMII:  Moderately controlled; start Januvia yesterday; will decrease Metformin to 1/2 tablet bid due to recurrent diarrhea. Obtain labs. 2.  Mixed hyperlipidemia: stable; obtain labs; continue current medications. 3.  Iron deficiency anemia: stable; tolerating iron tid.  Obtain labs. 4.  L second toe pain/contusion: New.  Pt declined xray due to lack of treatment if fractured; local wound care.  5.  Diabetic peripheral neuropathy: stable; no changes to management at this time. Monitor skin on feet daily; avoid walking barefooted. 6.  Diarrhea; recurrent; decrease Metformin to 1/2 tablet bid; if diarrhea persists, will HOLD metformin.  If persists after holding Metformin, refer to GI.  No previous colonoscopy.  Will also repeat stool studies. 7.  S/p flu vaccine.  No orders of the defined types were placed in this encounter.

## 2013-07-14 ENCOUNTER — Telehealth (HOSPITAL_COMMUNITY): Payer: Self-pay | Admitting: Cardiology

## 2013-07-14 NOTE — Telephone Encounter (Signed)
Out of Brilinta x 2-3 weeks Pt states she will d/c secondary to cost  Is it ok to switch to Plavix as this is a tier one med and should be approved with her insurance

## 2013-07-15 NOTE — Telephone Encounter (Signed)
Will send to Dr Haroldine Laws to review whether pt can change to Plavix, Chantel can you please see if she can get samples from [facility] in mean time also ask her about the Brilinta patient assistance program I thought she had enrolled in it previously

## 2013-07-17 NOTE — Telephone Encounter (Signed)
Yes. Can switch to plavix

## 2013-07-19 ENCOUNTER — Telehealth (HOSPITAL_COMMUNITY): Payer: Self-pay | Admitting: Adult Health

## 2013-07-19 MED ORDER — CLOPIDOGREL BISULFATE 75 MG PO TABS: 75.0000 mg | ORAL_TABLET | Freq: Every day | ORAL | Status: DC

## 2013-07-19 NOTE — Telephone Encounter (Signed)
Message copied by Darrick Grinder D on Mon Jul 19, 2013  3:09 PM ------      Message from: Brenton Grills      Created: Mon Jul 19, 2013  1:45 PM      Regarding: rx       Pharmacists called regarding pts Kary Kos, has been taking and now off of it due to cost.  Can this be discontinued or called in generic?  [phone] ------

## 2013-07-19 NOTE — Telephone Encounter (Signed)
Left message with [name] from [facility]. [phone]  Mrs Holzer is off brilinta and now on plavix.   Rickia Freeburg 3:10 PM

## 2013-07-19 NOTE — Telephone Encounter (Signed)
Left detailed message new rx sent to pharmacy, can check with [facility] for samples vs new rx to local pharmacy

## 2013-09-22 ENCOUNTER — Encounter: Payer: Self-pay | Admitting: Family Medicine

## 2013-09-22 ENCOUNTER — Ambulatory Visit (INDEPENDENT_AMBULATORY_CARE_PROVIDER_SITE_OTHER): Payer: Medicare Other | Admitting: Family Medicine

## 2013-09-22 VITALS — BP 131/62 | HR 87 | Temp 98.8°F | Resp 16 | Ht 68.0 in | Wt 265.0 lb

## 2013-09-22 DIAGNOSIS — R197 Diarrhea, unspecified: Secondary | ICD-10-CM

## 2013-09-22 DIAGNOSIS — E1142 Type 2 diabetes mellitus with diabetic polyneuropathy: Secondary | ICD-10-CM

## 2013-09-22 DIAGNOSIS — D509 Iron deficiency anemia, unspecified: Secondary | ICD-10-CM

## 2013-09-22 DIAGNOSIS — L259 Unspecified contact dermatitis, unspecified cause: Secondary | ICD-10-CM

## 2013-09-22 DIAGNOSIS — B079 Viral wart, unspecified: Secondary | ICD-10-CM

## 2013-09-22 DIAGNOSIS — E782 Mixed hyperlipidemia: Secondary | ICD-10-CM

## 2013-09-22 DIAGNOSIS — E119 Type 2 diabetes mellitus without complications: Secondary | ICD-10-CM

## 2013-09-22 DIAGNOSIS — L309 Dermatitis, unspecified: Secondary | ICD-10-CM

## 2013-09-22 DIAGNOSIS — I447 Left bundle-branch block, unspecified: Secondary | ICD-10-CM

## 2013-09-22 DIAGNOSIS — E7800 Pure hypercholesterolemia, unspecified: Secondary | ICD-10-CM

## 2013-09-22 DIAGNOSIS — I5022 Chronic systolic (congestive) heart failure: Secondary | ICD-10-CM

## 2013-09-22 LAB — HEMOGLOBIN A1C: Hgb A1c MFr Bld: 6.7 % — ABNORMAL HIGH (ref <5.7)

## 2013-09-22 LAB — CBC WITH DIFFERENTIAL/PLATELET
Basophils Absolute: 0 10*3/uL (ref 0.0–0.1)
HCT: 34.1 % — ABNORMAL LOW (ref 36.0–46.0)
Lymphocytes Relative: 24 % (ref 12–46)
Neutro Abs: 4.2 10*3/uL (ref 1.7–7.7)
Neutrophils Relative %: 69 % (ref 43–77)
Platelets: 193 10*3/uL (ref 150–400)
RDW: 13.6 % (ref 11.5–15.5)
WBC: 6.2 10*3/uL (ref 4.0–10.5)

## 2013-09-22 LAB — COMPLETE METABOLIC PANEL WITH GFR
ALT: 15 U/L (ref 0–35)
AST: 18 U/L (ref 0–37)
Albumin: 3.9 g/dL (ref 3.5–5.2)
Calcium: 8.7 mg/dL (ref 8.4–10.5)
Chloride: 105 mEq/L (ref 96–112)
Potassium: 4.7 mEq/L (ref 3.5–5.3)
Sodium: 140 mEq/L (ref 135–145)

## 2013-09-22 MED ORDER — KETOCONAZOLE 2 % EX CREA: 1.0000 "application " | TOPICAL_CREAM | Freq: Two times a day (BID) | CUTANEOUS | Status: DC

## 2013-09-22 NOTE — Progress Notes (Signed)
Subjective:    Patient ID: [name], female    DOB: 1955-11-16, 57 y.o.   MRN: [id number]  HPI This 57 y.o. female presents for the three month follow-up:  1. Warty lesion fingers:  Present for months to years; now has a new warty lesion on the adjacent finger; also has L fifth finger scaling around nail; nail is dystrophic.    2.  DMII:  Not checking sugars.  Taking Januvia daily q am.  Taking one Metformin qhs.  Diarrhea much better from last visit.  Nocturia x 1.    3.  Hyperlipidemia:  No changes to therapy made at last visit.  Patient reports good compliance with medication, good tolerance to medication, and good symptom control.    4.  Peripheral Neuropathy:  Had a flare lately; foot is dropping again; put brace back on.  Dr. Sandria Manly retired.  5.  Anemia:  Ferrous sulfate tid.  Denies melena or bloody stools; diarrhea has improved with decreased Metformin. No previous colonoscopy and refuses.    6.  CHF:  No follow-up with cardiology since last visit.  Due for follow-up; awaiting for appointment. Denies CP/palp/SOB/DOE/orthopnea/leg swelling.    Review of Systems  Constitutional: Negative for fever, chills, diaphoresis and fatigue.  Respiratory: Negative for cough, shortness of breath and stridor.   Cardiovascular: Negative for chest pain, palpitations and leg swelling.  Gastrointestinal: Negative for nausea, vomiting, abdominal pain, diarrhea, constipation, blood in stool and rectal pain.  Endocrine: Negative for cold intolerance, heat intolerance, polydipsia, polyphagia and polyuria.  Skin: Positive for color change. Negative for pallor, rash and wound.  Neurological: Positive for weakness and numbness. Negative for seizures, syncope, facial asymmetry, speech difficulty, light-headedness and headaches.  Hematological: Negative for adenopathy. Does not bruise/bleed easily.       Objective:   Physical Exam  Constitutional: She is oriented to person, place, and time. She  appears well-developed and well-nourished. No distress.  HENT:  Head: Normocephalic and atraumatic.  Right Ear: External ear normal.  Left Ear: External ear normal.  Nose: Nose normal.  Mouth/Throat: Oropharynx is clear and moist.  Eyes: Conjunctivae and EOM are normal. Pupils are equal, round, and reactive to light.  Neck: Normal range of motion. Neck supple. Carotid bruit is not present. No thyromegaly present.  Cardiovascular: Normal rate, regular rhythm, normal heart sounds and intact distal pulses.  Exam reveals no gallop and no friction rub.   No murmur heard. Pulmonary/Chest: Effort normal and breath sounds normal. She has no wheezes. She has no rales.  Abdominal: Soft. Bowel sounds are normal. She exhibits no distension and no mass. There is no tenderness. There is no rebound and no guarding.  Lymphadenopathy:    She has no cervical adenopathy.  Neurological: She is alert and oriented to person, place, and time. No cranial nerve deficit.  Skin: Skin is warm and dry. No rash noted. She is not diaphoretic. No erythema. No pallor.  +warty lesion fingers x 3.    Psychiatric: She has a normal mood and affect. Her behavior is normal.   PROCEDURE:  CRYOTHERAPY TO WARTS X 3.  PT TOLERATED WELL.    Assessment & Plan:  Type II or unspecified type diabetes mellitus without mention of complication, not stated as uncontrolled - Plan: Hemoglobin A1c  Iron deficiency anemia, unspecified - Plan: CBC with Differential, COMPLETE METABOLIC PANEL WITH GFR  Pure hypercholesterolemia  Left bundle branch block  Diarrhea  Diabetic peripheral neuropathy associated with type 2 diabetes mellitus  Chronic systolic heart failure  Wart  Dermatitis  1. DMII: moderately controlled; diarrhea improved with decreased dose of Metformin. Obtain labs.  2.  Iron deficiency anemia: stable; obtain labs; continue ferrous sulfate tid. 3.  Hypercholesterolemia: stable; obtain labs; continue current  medications. 4.  LBBB: stable. 5.  Diarrhea; Improved with decrease in Metformin dose.   6.  Peripheral neuropathy diabetic: recent worsening; continue current medications.  If continues to worsen, have patient follow-up with neurology. 7. CHF: stable; no evidence of volume overload; due for follow-up with cardiology. 8. Wart fingers:  New; s/p cryotherapy; may warrant repeat cryo treatment at next visit. 9.  Dermatitis:  New.  Rx for Ketoconazole cream to apply to area.   Meds ordered this encounter  Medications  . DISCONTD: ketoconazole (NIZORAL) 2 % cream    Sig: Apply 1 application topically 2 (two) times daily.    Dispense:  30 g    Refill:  0   Nilda Simmer, M.D.  [facility] [address] [place], [place]  [address] [phone] phone [phone] fax

## 2013-10-04 ENCOUNTER — Other Ambulatory Visit: Payer: Self-pay

## 2013-10-04 MED ORDER — KETOCONAZOLE 2 % EX CREA: 1.0000 "application " | TOPICAL_CREAM | Freq: Two times a day (BID) | CUTANEOUS | Status: DC

## 2013-10-04 NOTE — Telephone Encounter (Signed)
[facility] sent req for ketoconazole cream which was sent on 12/17. I am resending it to them.

## 2013-10-26 ENCOUNTER — Ambulatory Visit (HOSPITAL_COMMUNITY)
Admission: RE | Admit: 2013-10-26 | Discharge: 2013-10-26 | Disposition: A | Discharge status: Home / Self Care | Payer: Medicare Other | Source: Ambulatory Visit | Attending: Internal Medicine | Admitting: Internal Medicine

## 2013-10-26 ENCOUNTER — Encounter (HOSPITAL_COMMUNITY): Payer: Self-pay

## 2013-10-26 VITALS — BP 128/66 | HR 76 | Wt 262.0 lb

## 2013-10-26 DIAGNOSIS — I5021 Acute systolic (congestive) heart failure: Secondary | ICD-10-CM

## 2013-10-26 DIAGNOSIS — I5022 Chronic systolic (congestive) heart failure: Secondary | ICD-10-CM

## 2013-10-26 DIAGNOSIS — I10 Essential (primary) hypertension: Secondary | ICD-10-CM

## 2013-10-26 DIAGNOSIS — I2510 Atherosclerotic heart disease of native coronary artery without angina pectoris: Secondary | ICD-10-CM

## 2013-10-26 LAB — BASIC METABOLIC PANEL
BUN: 19 mg/dL (ref 6–23)
CHLORIDE: 101 meq/L (ref 96–112)
CO2: 25 meq/L (ref 19–32)
Calcium: 8.8 mg/dL (ref 8.4–10.5)
Creatinine, Ser: 1.04 mg/dL (ref 0.50–1.10)
GFR calc Af Amer: 67 mL/min — ABNORMAL LOW (ref >90)
GFR calc non Af Amer: 58 mL/min — ABNORMAL LOW (ref >90)
Glucose, Bld: 204 mg/dL — ABNORMAL HIGH (ref 70–99)
POTASSIUM: 5.2 meq/L (ref 3.7–5.3)
SODIUM: 138 meq/L (ref 137–147)

## 2013-10-26 MED ORDER — ISOSORBIDE MONONITRATE ER 60 MG PO TB24: 60.0000 mg | ORAL_TABLET | Freq: Every day | ORAL | Status: DC

## 2013-10-26 MED ORDER — CARVEDILOL 25 MG PO TABS: 25.0000 mg | ORAL_TABLET | Freq: Two times a day (BID) | ORAL | Status: DC

## 2013-10-26 NOTE — Progress Notes (Signed)
Patient ID: [name], female   DOB: August 04, 1956, 58 y.o.   MRN: [id number]   Weight Range   275 pounds  Baseline proBNP   5000.6    HPI: [name] is a 58 y.o. female with past medical history of HL, morbid obesity, chronic pain and DM since she was 58 years old. She had neuropathy in 2011 causing her to be in a wheelchair, this recovered after 8 months of therapy.   Admitted to [facility] 10/13 with progressive dyspnea and lower extremity edema.  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  Underwent complex PTCA and stenting of LAD by Dr. Burt Knack.  She was placed on Brilinta and ASA for 1 year.    She has been unable to tolerate spiro or lisinopril due to hyperkalemia.  ECHO 10/29/12 EF 35-40%  ECHO 03/11/13 EF 40%  Follow up:  Last visit stopped digoxin and increased hydralazine to 50 mg TID. Changed from [place] to Plavix d/t cost. Doing well. Denies SOB, orthopnea, PND, or CP. Minimal LE edema. Exercising three times a week for an hr with no issues. Weight at home stable 257-262. Following a low salt diet and drinking less than 2L a day. Taking medications as prescribed. Has only needed torsemide once since last visit.   ROS: All systems negative except as listed in HPI, PMH and Problem List.  Past Medical History  Diagnosis Date  . Pure hypercholesterolemia   . Obesity, unspecified   . Unspecified vitamin D deficiency   . Pure hypercholesterolemia   . Type II or unspecified type diabetes mellitus without mention of complication, not stated as uncontrolled   . Elevated blood pressure reading without diagnosis of hypertension   . Neuropathy 2011  . Chicken pox   . Coronary artery disease   . CHF (congestive heart failure) 07/19/2012    EF 15% by cardiac catheterization.  Admission.  . Myocardial infarct 07/19/12    s/p cardiac catheterization with stenting to LAD.  Marland Kitchen Hypertension   . Anemia     Current  Outpatient Prescriptions  Medication Sig Dispense Refill  . aspirin EC 81 MG tablet Take 1 tablet (81 mg total) by mouth daily.  90 tablet  3  . carvedilol (COREG) 12.5 MG tablet Take 1.5 tablets (18.75 mg total) by mouth 2 (two) times daily with a meal.  270 tablet  3  . clopidogrel (PLAVIX) 75 MG tablet Take 1 tablet (75 mg total) by mouth daily.  90 tablet  3  . gabapentin (NEURONTIN) 600 MG tablet Take 1 tablet (600 mg total) by mouth 3 (three) times daily.  270 tablet  1  . glimepiride (AMARYL) 4 MG tablet Take 1 tablet (4 mg total) by mouth 2 (two) times daily before a meal.  180 tablet  3  . hydrALAZINE (APRESOLINE) 50 MG tablet Take 1 tablet (50 mg total) by mouth 3 (three) times daily.  90 tablet  3  . isosorbide mononitrate (IMDUR) 30 MG 24 hr tablet Take 1 tablet (30 mg total) by mouth daily.  90 tablet  3  . ketoconazole (NIZORAL) 2 % cream Apply 1 application topically 2 (two) times daily.  30 g  0  . metFORMIN (GLUCOPHAGE) 1000 MG tablet Take 1 tablet (1,000 mg total) by mouth 2 (two) times daily with a meal.  180 tablet  3  . nortriptyline (PAMELOR) 50 MG capsule Take 1 capsule (50 mg total)  by mouth at bedtime.  90 capsule  3  . potassium chloride SA (K-DUR,KLOR-CON) 20 MEQ tablet Take 20 mEq by mouth daily as needed. Take only when take Torsemide      . pravastatin (PRAVACHOL) 40 MG tablet Take 1 tablet (40 mg total) by mouth at bedtime.  90 tablet  3  . sitaGLIPtin (JANUVIA) 50 MG tablet Take 1 tablet (50 mg total) by mouth daily.  90 tablet  1  . torsemide (DEMADEX) 20 MG tablet Take 20 mg by mouth daily as needed.      . traMADol (ULTRAM) 50 MG tablet Take 1 tablet (50 mg total) by mouth every 6 (six) hours as needed. For pain  180 tablet  1  . nitroGLYCERIN (NITROSTAT) 0.4 MG SL tablet Place 1 tablet (0.4 mg total) under the tongue every 5 (five) minutes as needed for chest pain.  30 tablet  0   No current facility-administered medications for this encounter.    Filed  Vitals:   10/26/13 1058  BP: 128/66  Pulse: 76  Weight: 262 lb (118.842 kg)  SpO2: 97%   PHYSICAL EXAM: General: Obese, Well appearing. No resp difficulty, mother present HEENT: normal  Neck: supple. JVP 6-7. Carotids 2+ bilat; no bruits. No lymphadenopathy or thryomegaly appreciated.  Cor: PMI nondisplaced. Regular rate & rhythm. Split S2. No S3.  Lungs: clear  Abdomen: obese, soft, nontender, nondistended. No hepatosplenomegaly. No bruits or masses. Good bowel sounds.  Extremities: no cyanosis, clubbing, rash, trace edema; LLE brace on  Neuro: alert & orientedx3, cranial nerves grossly intact. moves all 4 extremities w/o difficulty. Affect pleasant  ASSESSMENT & PLAN:  1) Chronic systolic HF: ICM, EF 123456 (03/2013) - NYHA I symptoms and volume status stable. Will continue torsemide PRN. - Will increase coreg to goal dose 25 mg BID. Instructed if notice any fatigue to go back to 18.75 mg BID. - Will increase IMDUR to 60 mg daily and continue hydralazine 50 mg TID. - Check BMET today. - There was some concern on last ECHO whether EF was 30% or 40% and patient was set up for cMRI to quantify EF, however she missed apt and is having financial issues. Will repeat ECHO 6 months to reassess and told to call sooner if she starts having HF symptoms.  - Reinforced the need and importance of daily weights, a low sodium diet, and fluid restriction (less than 2 L a day). Instructed to call the HF clinic if weight increases more than 3 lbs overnight or 5 lbs in a week.  2) CAD- s/p complex PTCA and stenting or LAD 07/2012 - Continue statin, ASA and plavix 3) Morbid Obesity - Congratulated patient on exercising. Encouraged to continue to exercise and try to watch her portion control.   Junie Bame B NP-C 1:44 PM

## 2013-10-26 NOTE — Patient Instructions (Signed)
Doing great!  Increase your coreg to 25 mg (2 tablets) in the morning and 25 mg (2 tablets) in the evening. Your new prescription will be 25 mg tablets so then you will take 1 tablet in the morning and 1 tablet in the evening. Call if notice any increase in fatigue.  Increase your IMDUR to 60 mg daily (2 tablets). Your new prescription will be 60 mg tablets so then you will only take 1 tablet daily.  Follow up 6 months with ECHO.  Do the following things EVERYDAY: 1) Weigh yourself in the morning before breakfast. Write it down and keep it in a log. 2) Take your medicines as prescribed 3) Eat low salt foods-Limit salt (sodium) to 2000 mg per day.  4) Stay as active as you can everyday 5) Limit all fluids for the day to less than 2 liters

## 2013-11-16 ENCOUNTER — Other Ambulatory Visit (HOSPITAL_COMMUNITY): Payer: Self-pay

## 2013-11-16 ENCOUNTER — Other Ambulatory Visit: Payer: Self-pay

## 2013-11-16 DIAGNOSIS — I5021 Acute systolic (congestive) heart failure: Secondary | ICD-10-CM

## 2013-11-16 DIAGNOSIS — E119 Type 2 diabetes mellitus without complications: Secondary | ICD-10-CM

## 2013-11-16 MED ORDER — GLIMEPIRIDE 4 MG PO TABS: 4.0000 mg | ORAL_TABLET | Freq: Two times a day (BID) | ORAL | Status: DC

## 2013-11-16 MED ORDER — HYDRALAZINE HCL 50 MG PO TABS: 50.0000 mg | ORAL_TABLET | Freq: Three times a day (TID) | ORAL | Status: DC

## 2013-11-19 ENCOUNTER — Other Ambulatory Visit: Payer: Self-pay

## 2013-11-19 DIAGNOSIS — E119 Type 2 diabetes mellitus without complications: Secondary | ICD-10-CM

## 2013-11-19 MED ORDER — METFORMIN HCL 1000 MG PO TABS: 1000.0000 mg | ORAL_TABLET | Freq: Two times a day (BID) | ORAL | Status: DC

## 2013-11-23 ENCOUNTER — Other Ambulatory Visit (HOSPITAL_COMMUNITY): Payer: Self-pay | Admitting: *Deleted

## 2013-11-23 DIAGNOSIS — I5021 Acute systolic (congestive) heart failure: Secondary | ICD-10-CM

## 2013-11-23 MED ORDER — HYDRALAZINE HCL 50 MG PO TABS: 50.0000 mg | ORAL_TABLET | Freq: Three times a day (TID) | ORAL | Status: DC

## 2013-11-24 ENCOUNTER — Other Ambulatory Visit: Payer: Self-pay

## 2013-11-24 DIAGNOSIS — E1142 Type 2 diabetes mellitus with diabetic polyneuropathy: Secondary | ICD-10-CM

## 2013-11-24 MED ORDER — GABAPENTIN 600 MG PO TABS: 600.0000 mg | ORAL_TABLET | Freq: Three times a day (TID) | ORAL | Status: DC

## 2013-11-26 ENCOUNTER — Other Ambulatory Visit (HOSPITAL_COMMUNITY): Payer: Self-pay

## 2013-11-26 DIAGNOSIS — E7800 Pure hypercholesterolemia, unspecified: Secondary | ICD-10-CM

## 2013-11-26 MED ORDER — PRAVASTATIN SODIUM 40 MG PO TABS: 40.0000 mg | ORAL_TABLET | Freq: Every day | ORAL | Status: DC

## 2013-12-08 ENCOUNTER — Other Ambulatory Visit: Payer: Self-pay

## 2013-12-08 DIAGNOSIS — Z1231 Encounter for screening mammogram for malignant neoplasm of breast: Secondary | ICD-10-CM

## 2013-12-21 ENCOUNTER — Encounter: Payer: Self-pay | Admitting: Family Medicine

## 2013-12-21 ENCOUNTER — Ambulatory Visit (INDEPENDENT_AMBULATORY_CARE_PROVIDER_SITE_OTHER): Payer: Medicare Other | Admitting: Family Medicine

## 2013-12-21 ENCOUNTER — Other Ambulatory Visit: Payer: Self-pay

## 2013-12-21 VITALS — BP 102/56 | HR 74 | Temp 97.8°F | Resp 16 | Ht 68.0 in | Wt 261.4 lb

## 2013-12-21 DIAGNOSIS — E1142 Type 2 diabetes mellitus with diabetic polyneuropathy: Secondary | ICD-10-CM

## 2013-12-21 DIAGNOSIS — E1149 Type 2 diabetes mellitus with other diabetic neurological complication: Secondary | ICD-10-CM

## 2013-12-21 DIAGNOSIS — E119 Type 2 diabetes mellitus without complications: Secondary | ICD-10-CM

## 2013-12-21 DIAGNOSIS — D509 Iron deficiency anemia, unspecified: Secondary | ICD-10-CM

## 2013-12-21 DIAGNOSIS — E782 Mixed hyperlipidemia: Secondary | ICD-10-CM

## 2013-12-21 LAB — LIPID PANEL
CHOL/HDL RATIO: 5.1 ratio
Cholesterol: 137 mg/dL (ref 0–200)
HDL: 27 mg/dL — ABNORMAL LOW (ref >39)
LDL Cholesterol: 41 mg/dL (ref 0–99)
Triglycerides: 343 mg/dL — ABNORMAL HIGH (ref <150)
VLDL: 69 mg/dL — AB (ref 0–40)

## 2013-12-21 LAB — CBC WITH DIFFERENTIAL/PLATELET
BASOS PCT: 0 % (ref 0–1)
Basophils Absolute: 0 10*3/uL (ref 0.0–0.1)
Eosinophils Absolute: 0.2 10*3/uL (ref 0.0–0.7)
Eosinophils Relative: 2 % (ref 0–5)
HCT: 34.8 % — ABNORMAL LOW (ref 36.0–46.0)
HEMOGLOBIN: 11.8 g/dL — AB (ref 12.0–15.0)
LYMPHS ABS: 1.5 10*3/uL (ref 0.7–4.0)
Lymphocytes Relative: 20 % (ref 12–46)
MCH: 28.4 pg (ref 26.0–34.0)
MCHC: 33.9 g/dL (ref 30.0–36.0)
MCV: 83.7 fL (ref 78.0–100.0)
MONOS PCT: 6 % (ref 3–12)
Monocytes Absolute: 0.5 10*3/uL (ref 0.1–1.0)
NEUTROS ABS: 5.4 10*3/uL (ref 1.7–7.7)
Neutrophils Relative %: 72 % (ref 43–77)
Platelets: 191 10*3/uL (ref 150–400)
RBC: 4.16 MIL/uL (ref 3.87–5.11)
RDW: 14.6 % (ref 11.5–15.5)
WBC: 7.5 10*3/uL (ref 4.0–10.5)

## 2013-12-21 LAB — COMPLETE METABOLIC PANEL WITH GFR
ALBUMIN: 4 g/dL (ref 3.5–5.2)
ALT: 17 U/L (ref 0–35)
AST: 17 U/L (ref 0–37)
Alkaline Phosphatase: 83 U/L (ref 39–117)
BUN: 21 mg/dL (ref 6–23)
CO2: 26 mEq/L (ref 19–32)
Calcium: 9.4 mg/dL (ref 8.4–10.5)
Chloride: 103 mEq/L (ref 96–112)
Creat: 1.27 mg/dL — ABNORMAL HIGH (ref 0.50–1.10)
GFR, EST NON AFRICAN AMERICAN: 46 mL/min — AB
GFR, Est African American: 53 mL/min — ABNORMAL LOW
GLUCOSE: 125 mg/dL — AB (ref 70–99)
Potassium: 5.2 mEq/L (ref 3.5–5.3)
Sodium: 137 mEq/L (ref 135–145)
Total Bilirubin: 0.4 mg/dL (ref 0.2–1.2)
Total Protein: 6.9 g/dL (ref 6.0–8.3)

## 2013-12-21 LAB — POCT GLYCOSYLATED HEMOGLOBIN (HGB A1C): Hemoglobin A1C: 7.1

## 2013-12-21 MED ORDER — SITAGLIPTIN PHOSPHATE 50 MG PO TABS: 50.0000 mg | ORAL_TABLET | Freq: Every day | ORAL | Status: DC

## 2013-12-21 MED ORDER — NORTRIPTYLINE HCL 50 MG PO CAPS: 50.0000 mg | ORAL_CAPSULE | Freq: Every day | ORAL | Status: DC

## 2013-12-21 MED ORDER — TRAMADOL HCL 50 MG PO TABS: 50.0000 mg | ORAL_TABLET | Freq: Four times a day (QID) | ORAL | Status: DC | PRN

## 2013-12-21 NOTE — Progress Notes (Signed)
Subjective:    Patient ID: [name], female    DOB: 01-May-1956, 58 y.o.   MRN: [id number]  12/21/2013  3 month check up; Depression; Hyperlipidemia; and Diabetes   HPI This 58 y.o. female presents for three month follow-up:  1. DMII:  Not checking sugars; this morning 123.  Metformin 1/2 every morning and 1 at night.  Januvia every morning.  Januvia is very expensive.   Went in donut hole last year in August.  Called Januvia company for assistance.    2.  Hyperlipidemia:  No changes to management made at last visit.  Patient reports good compliance with medication, good tolerance to medication, and good symptom control.    3. Anemia:  Iron bid.  Denies n/v/d/c; denies abdominal pain or decrease in appetite. Denies bloody stools or black stools.    4. Sadness:  Daughter lost job; daughter lives in trailer of patient's; daughter with lymphoma; started with knot of face; s/p resection recurrent; +pathology cancer.  Started having nosebleeds; +lymphoma at age 48.  Brother in law needs financial help; he has no one.  Husband with PAD/PVD; Dr. Scot Dock did not recommend surgery unless horribly painful; recommend exercise more.  Stress started six months ago.  Takes care of financial issues.  Does all the errands.  Husband on disability due to back pain/DDD lumbar.  Still going to mother's house three days per week.   Sewing a lot.    5. Peripheral Neuropathy:  Unchanged.  Patient reports good compliance with medication, good tolerance to medication, and good symptom control.     6.  CHF: cardiology increased Carvedilol; increased Hydralazine and Imdur.  Rare Torsemide use.   Review of Systems  Constitutional: Negative for fever, chills, diaphoresis and fatigue.  Eyes: Negative for visual disturbance.  Respiratory: Negative for cough and shortness of breath.   Cardiovascular: Negative for chest pain, palpitations and leg swelling.  Gastrointestinal: Negative for nausea, vomiting,  abdominal pain, diarrhea, constipation, blood in stool, abdominal distention, anal bleeding and rectal pain.  Endocrine: Negative for cold intolerance, heat intolerance, polydipsia, polyphagia and polyuria.  Neurological: Positive for numbness. Negative for dizziness, tremors, seizures, syncope, facial asymmetry, speech difficulty, weakness, light-headedness and headaches.  Psychiatric/Behavioral: Positive for dysphoric mood.    Past Medical History  Diagnosis Date  . Obesity, unspecified   . Unspecified vitamin D deficiency   . Pure hypercholesterolemia   . Neuropathy 2011  . Chicken pox   . Coronary artery disease   . CHF (congestive heart failure) 07/19/2012    EF 15% by cardiac catheterization.  Admission.  . Hypertension   . Anemia   . Automatic implantable cardioverter-defibrillator in situ   . Type II diabetes mellitus   . Myocardial infarct 07/19/12    s/p cardiac catheterization with stenting to LAD.  Marland Kitchen History of blood transfusion ~ 2011    "plasma; had neuropathy; couldn't walk"   No Known Allergies Current Outpatient Prescriptions  Medication Sig Dispense Refill  . isosorbide mononitrate (IMDUR) 60 MG 24 hr tablet Take 1 tablet (60 mg total) by mouth daily. 90 tablet 3  . nitroGLYCERIN (NITROSTAT) 0.4 MG SL tablet Place 1 tablet (0.4 mg total) under the tongue every 5 (five) minutes as needed for chest pain. 30 tablet 0  . aspirin EC 81 MG tablet Take 1 tablet (81 mg total) by mouth daily. 90 tablet 3  . carvedilol (COREG) 25 MG tablet Take 1 tablet (25 mg total) by mouth 2 (two) times daily  with a meal. 180 tablet 3  . clopidogrel (PLAVIX) 75 MG tablet Take 1 tablet (75 mg total) by mouth daily. 90 tablet 3  . doxycycline (VIBRAMYCIN) 100 MG capsule Take 1 capsule (100 mg total) by mouth 2 (two) times daily. 20 capsule 0  . gabapentin (NEURONTIN) 600 MG tablet Take 1 tablet (600 mg total) by mouth 3 (three) times daily. 270 tablet 3  . glimepiride (AMARYL) 4 MG tablet  Take 1 tablet (4 mg total) by mouth 2 (two) times daily before a meal. 180 tablet 3  . glucose blood test strip Test blood sugar daily. Dx code: 250.00 100 each 4  . hydrALAZINE (APRESOLINE) 50 MG tablet Take 1 tablet (50 mg total) by mouth 3 (three) times daily. 270 tablet 3  . metFORMIN (GLUCOPHAGE) 1000 MG tablet Take 1,000 mg by mouth daily.    . nortriptyline (PAMELOR) 50 MG capsule Take 1 capsule (50 mg total) by mouth at bedtime. 90 capsule 3  . potassium chloride SA (K-DUR,KLOR-CON) 20 MEQ tablet Take 20 mEq by mouth every other day as needed (for low potassium). Take only when taking Torsemide    . pravastatin (PRAVACHOL) 40 MG tablet Take 1 tablet (40 mg total) by mouth at bedtime. 90 tablet 3  . sitaGLIPtin (JANUVIA) 100 MG tablet Take 1 tablet (100 mg total) by mouth daily. 90 tablet 1  . torsemide (DEMADEX) 20 MG tablet Take 1 tablet (20 mg total) by mouth every other day. 45 tablet 3  . traMADol (ULTRAM) 50 MG tablet Take 1 tablet (50 mg total) by mouth every 6 (six) hours as needed. For pain 180 tablet 2   No current facility-administered medications for this visit.       Objective:    BP 102/56 mmHg  Pulse 74  Temp(Src) 97.8 F (36.6 C) (Oral)  Resp 16  Ht 5' 8"  (1.727 m)  Wt 261 lb 6.4 oz (118.57 kg)  BMI 39.75 kg/m2  SpO2 95% Physical Exam  Constitutional: She is oriented to person, place, and time. She appears well-developed and well-nourished. No distress.  obese  HENT:  Head: Normocephalic and atraumatic.  Right Ear: External ear normal.  Left Ear: External ear normal.  Nose: Nose normal.  Mouth/Throat: Oropharynx is clear and moist.  Eyes: Conjunctivae and EOM are normal. Pupils are equal, round, and reactive to light.  Neck: Normal range of motion. Neck supple. Carotid bruit is not present. No thyromegaly present.  Cardiovascular: Normal rate, regular rhythm, normal heart sounds and intact distal pulses.  Exam reveals no gallop and no friction rub.   No  murmur heard. Pulmonary/Chest: Effort normal and breath sounds normal. She has no wheezes. She has no rales.  Abdominal: Soft. Bowel sounds are normal. She exhibits no distension and no mass. There is no tenderness. There is no rebound and no guarding.  Lymphadenopathy:    She has no cervical adenopathy.  Neurological: She is alert and oriented to person, place, and time. No cranial nerve deficit.  Skin: Skin is warm and dry. No rash noted. She is not diaphoretic. No erythema. No pallor.  Psychiatric: She has a normal mood and affect. Her behavior is normal.   Results for orders placed or performed in visit on 12/21/13  CBC with Differential  Result Value Ref Range   WBC 7.5 4.0 - 10.5 K/uL   RBC 4.16 3.87 - 5.11 MIL/uL   Hemoglobin 11.8 (L) 12.0 - 15.0 g/dL   HCT 34.8 (L) 36.0 - 46.0 %  MCV 83.7 78.0 - 100.0 fL   MCH 28.4 26.0 - 34.0 pg   MCHC 33.9 30.0 - 36.0 g/dL   RDW 14.6 11.5 - 15.5 %   Platelets 191 150 - 400 K/uL   Neutrophils Relative % 72 43 - 77 %   Neutro Abs 5.4 1.7 - 7.7 K/uL   Lymphocytes Relative 20 12 - 46 %   Lymphs Abs 1.5 0.7 - 4.0 K/uL   Monocytes Relative 6 3 - 12 %   Monocytes Absolute 0.5 0.1 - 1.0 K/uL   Eosinophils Relative 2 0 - 5 %   Eosinophils Absolute 0.2 0.0 - 0.7 K/uL   Basophils Relative 0 0 - 1 %   Basophils Absolute 0.0 0.0 - 0.1 K/uL   Smear Review Criteria for review not met   COMPLETE METABOLIC PANEL WITH GFR  Result Value Ref Range   Sodium 137 135 - 145 mEq/L   Potassium 5.2 3.5 - 5.3 mEq/L   Chloride 103 96 - 112 mEq/L   CO2 26 19 - 32 mEq/L   Glucose, Bld 125 (H) 70 - 99 mg/dL   BUN 21 6 - 23 mg/dL   Creat 1.27 (H) 0.50 - 1.10 mg/dL   Total Bilirubin 0.4 0.2 - 1.2 mg/dL   Alkaline Phosphatase 83 39 - 117 U/L   AST 17 0 - 37 U/L   ALT 17 0 - 35 U/L   Total Protein 6.9 6.0 - 8.3 g/dL   Albumin 4.0 3.5 - 5.2 g/dL   Calcium 9.4 8.4 - 10.5 mg/dL   GFR, Est African American 53 (L) mL/min   GFR, Est Non African American 46 (L)  mL/min  Lipid panel  Result Value Ref Range   Cholesterol 137 0 - 200 mg/dL   Triglycerides 343 (H) <150 mg/dL   HDL 27 (L) >39 mg/dL   Total CHOL/HDL Ratio 5.1 Ratio   VLDL 69 (H) 0 - 40 mg/dL   LDL Cholesterol 41 0 - 99 mg/dL  POCT glycosylated hemoglobin (Hb A1C)  Result Value Ref Range   Hemoglobin A1C 7.1        Assessment & Plan:  Type II or unspecified type diabetes mellitus without mention of complication, not stated as uncontrolled - Plan: POCT glycosylated hemoglobin (Hb A1C), HM Diabetes Foot Exam  Mixed hyperlipidemia - Plan: Lipid panel  Iron deficiency anemia, unspecified - Plan: CBC with Differential, COMPLETE METABOLIC PANEL WITH GFR  Diabetic peripheral neuropathy associated with type 2 diabetes mellitus - Plan: DISCONTINUED: traMADol (ULTRAM) 50 MG tablet   1. DMII: moderately controlled; obtain labs; continue current medications. 2.  Mixed hyperlipidemia: controlled; obtain labs; continue current medications. 3.  Iron deficiency anemia: stable; continue iron bid.  Hemosure negative in past; refuses colonoscopy. 4.  Diabetic peripheral neuropathy: stable; refill of Tramadol provided.  No change in management. 5. CHF systolic: stable; euvolemic today.  Followed closely by cardiology.   Meds ordered this encounter  Medications  . DISCONTD: traMADol (ULTRAM) 50 MG tablet    Sig: Take 1 tablet (50 mg total) by mouth every 6 (six) hours as needed. For pain    Dispense:  180 tablet    Refill:  1    Return in about 3 months (around 03/23/2014) for complete physical examiniation.     [name], M.D.  [facility] [address] [place], [place]  [address] [phone] phone [phone] fax

## 2013-12-22 ENCOUNTER — Telehealth: Payer: Self-pay

## 2013-12-22 NOTE — Telephone Encounter (Signed)
Completed Tier Exception for gabapentin and faxed to ins.

## 2013-12-25 ENCOUNTER — Ambulatory Visit: Payer: Medicare Other

## 2013-12-25 ENCOUNTER — Ambulatory Visit (INDEPENDENT_AMBULATORY_CARE_PROVIDER_SITE_OTHER): Payer: Medicare Other | Admitting: Emergency Medicine

## 2013-12-25 VITALS — BP 160/70 | HR 85 | Temp 98.1°F | Resp 18 | Ht 68.0 in | Wt 262.0 lb

## 2013-12-25 DIAGNOSIS — W19XXXA Unspecified fall, initial encounter: Secondary | ICD-10-CM

## 2013-12-25 DIAGNOSIS — M79609 Pain in unspecified limb: Secondary | ICD-10-CM

## 2013-12-25 DIAGNOSIS — M25531 Pain in right wrist: Secondary | ICD-10-CM

## 2013-12-25 DIAGNOSIS — M25539 Pain in unspecified wrist: Secondary | ICD-10-CM

## 2013-12-25 DIAGNOSIS — Y92009 Unspecified place in unspecified non-institutional (private) residence as the place of occurrence of the external cause: Principal | ICD-10-CM

## 2013-12-25 DIAGNOSIS — M79641 Pain in right hand: Secondary | ICD-10-CM

## 2013-12-25 DIAGNOSIS — S82009A Unspecified fracture of unspecified patella, initial encounter for closed fracture: Secondary | ICD-10-CM

## 2013-12-25 DIAGNOSIS — M25562 Pain in left knee: Secondary | ICD-10-CM

## 2013-12-25 DIAGNOSIS — M25569 Pain in unspecified knee: Secondary | ICD-10-CM

## 2013-12-25 MED ORDER — HYDROCODONE-ACETAMINOPHEN 5-325 MG PO TABS: 1.0000 | ORAL_TABLET | Freq: Four times a day (QID) | ORAL | Status: DC | PRN

## 2013-12-25 NOTE — Patient Instructions (Signed)
We will call you with your appointment next week. Do not take the Ultram you have   with the hydrocodone.

## 2013-12-25 NOTE — Progress Notes (Signed)
Subjective:    Patient ID: [name], female    DOB: [birth year], 58 y.o.   MRN: [id number]  HPI This chart was scribed for Viviann Spare Josemaria Brining-MD by Smiley Houseman, Scribe. This patient was seen in room 1 and the patient's care was started at 5:37 PM.  HPI Comments: [name] is a 58 y.o. female who presents to the [facility] complaining of constant right wrist pain and left knee pain that started after she fell earlier today.  Pt states she was walking across a rug on hardwood floor, when she tripped over her great toe.  Pt reports she has severe neuropathy and a foot drop in her left leg.  She states she immediately bruised on her right wrist, but is unsure of bruising around her left knee.  Pt denies any other injuries due to the fall.     Past Surgical History  Procedure Laterality Date  . Lumbar back surgery  1996    L4-5  . Groin abscess  2007    I & D with ICU stay due to sepsis.  . Gallbladder surgery  2011  . Total abdominal hysterectomy  01/2011  . Bilateral oophorectomy  01/2011    ovarian cyst benign  . Admission  07/19/12    new onset systolic CHF; elevated troponins.  Cardiac catheterization with stenting to LAD; EF 15%.  2D-echo: EF 20-25%.  . Cholecystectomy    . Tubal ligation    . Cardiac catheterization Left     Family History  Problem Relation Age of Onset  . Diabetes Mother   . Hypertension Mother   . Arthritis Mother     knees, lumbar DDD, cervical DDD  . Cancer Father     prostate,skin,lymphoma.  . Obesity Brother   . Diabetes Maternal Grandmother   . Diabetes Paternal Grandmother     History   Social History  . Marital Status: Married    Spouse Name: N/A    Number of Children: 2  . Years of Education: 12   Occupational History  . disabled     03/2010 for peripheral neuropathy  . home daycare     x 20 yrs.   Social History Main Topics  . Smoking status: Never Smoker   . Smokeless tobacco: Not on file  . Alcohol  Use: No  . Drug Use: No  . Sexual Activity: Not on file   Other Topics Concern  . Not on file   Social History Narrative   Marital status:Married x 40 yrs. Happily married, no abuse.       Children:  2 children (daughter 27, son 22). One grandson and 2 step grandchildren. Daughter and grandson live in home with pt and spouse.       Lives: with husband, daughter, grandson.      Employment: disability for peripheral neuropathy 2012.       Tobacco: none      Alcohol: none      Drugs: none      Exercise: none      Pets: dog.      Always uses seat belts, smoke detectors in home.      No guns in the home. Caffeine use: 2 cups coffee per day.       Nutrition: Well balanced diet.    No Known Allergies  Patient Active Problem List   Diagnosis Date Noted  . Morbid obesity 10/26/2013  . Routine general medical examination at a health  care facility 04/29/2013  . Diarrhea 04/29/2013  . Essential hypertension, benign 04/29/2013  . Renal insufficiency 04/19/2013  . Type II or unspecified type diabetes mellitus without mention of complication, not stated as uncontrolled 12/07/2012  . Pure hypercholesterolemia 12/07/2012  . Iron deficiency anemia, unspecified 12/07/2012  . Diabetic peripheral neuropathy associated with type 2 diabetes mellitus 12/07/2012  . Chronic systolic heart failure 07/29/2012  . Left bundle branch block 07/25/2012  . CAD (coronary artery disease) 07/25/2012  . Acute CHF (congestive heart failure) 07/21/2012  . Diabetes mellitus type 2 in obese 07/21/2012  . Hypoxia 07/21/2012  . HLD (hyperlipidemia) 07/21/2012  . Acute systolic heart failure 07/21/2012  . Acute myocardial infarction, subendocardial infarction, initial episode of care 07/21/2012   Review of Systems  Constitutional: Negative for fever and chills.  Musculoskeletal: Positive for arthralgias (Right wrist and left knee) and joint swelling. Negative for back pain, neck pain and neck stiffness.  Skin:  Negative for color change and rash.  Neurological: Negative for headaches.       Objective:   Physical Exam Nursing note and vitals reviewed. Constitutional: She is oriented to person, place, and time. She appears well-developed and well-nourished. No distress.  HENT:  Head: Normocephalic and atraumatic.  Right Ear: External ear normal.  Left Ear: External ear normal.  Nose: Nose normal.  Mouth/Throat: Oropharynx is clear and moist. No oropharyngeal exudate.  Eyes: Conjunctivae and EOM are normal. Right eye exhibits no discharge. Left eye exhibits no discharge.  Neck: Normal range of motion. Neck supple. No tracheal deviation present. No thyromegaly present.  Cardiovascular: Normal rate.  Exam reveals gallop and S3. Exam reveals no friction rub.   No murmur heard. Pulmonary/Chest: Effort normal and breath sounds normal. No respiratory distress. She has no wheezes. She has no rales.  Musculoskeletal: Normal range of motion.       Right wrist: She exhibits tenderness.  Right hand: Bruising over the 3rd MCP joint.  Bruising over the thenar eminence thumb.  Pain with flexion and extension of the wrist.  Left knee: She is able to flex and extend.  There is no instability.  There is bruising over the mid portion of the patellar.    Lymphadenopathy:    She has no cervical adenopathy.  Neurological: She is alert and oriented to person, place, and time.  Skin: Skin is warm and dry.  Psychiatric: She has a normal mood and affect. Her behavior is normal.   Filed Vitals:   12/25/13 1729  BP: 160/70  Pulse: 85  Temp: 98.1 F (36.7 C)  TempSrc: Oral  Resp: 18  Height: 5\' 8"  (1.727 m)  Weight: 262 lb (118.842 kg)  SpO2: 97%   DIAGNOSTIC STUDIES: Oxygen Saturation is 97% on RA, normal by my interpretation.    COORDINATION OF CARE: 5:41 PM-Will order x-ray of right wrist, right hand, and left knee.  Patient informed of current plan of treatment and evaluation and agrees with plan.      [facility] reading (PRIMARY) by  Dr. Cleta Alberts x-rays of the right hand and wrist do not reveal any fracture x-ray of the left knee reveals a fracture of the patella .   Assessment & Plan:  Patient placed in immobilizer. She will follow up with Dr. Forde Radon. She was given hydrocodone for pain. She was given a right wrist splint. we'll await the reading from the radiologist regarding her hand and wrist.

## 2014-01-18 ENCOUNTER — Ambulatory Visit: Payer: Medicare Other

## 2014-01-26 NOTE — Telephone Encounter (Signed)
Called [employer]. Tier 1 approved for gabapentin. Faxed notice to [employer].

## 2014-02-02 ENCOUNTER — Ambulatory Visit: Payer: Medicare Other

## 2014-02-02 ENCOUNTER — Ambulatory Visit
Admission: RE | Admit: 2014-02-02 | Discharge: 2014-02-02 | Disposition: A | Discharge status: Home / Self Care | Payer: Medicare Other | Source: Ambulatory Visit

## 2014-02-02 ENCOUNTER — Encounter (INDEPENDENT_AMBULATORY_CARE_PROVIDER_SITE_OTHER): Payer: Self-pay

## 2014-02-02 DIAGNOSIS — Z1231 Encounter for screening mammogram for malignant neoplasm of breast: Secondary | ICD-10-CM

## 2014-03-15 ENCOUNTER — Ambulatory Visit (INDEPENDENT_AMBULATORY_CARE_PROVIDER_SITE_OTHER): Payer: Medicare Other | Admitting: Family Medicine

## 2014-03-15 ENCOUNTER — Encounter: Payer: Medicare Other | Admitting: Family Medicine

## 2014-03-15 ENCOUNTER — Encounter: Payer: Self-pay | Admitting: Family Medicine

## 2014-03-15 VITALS — BP 132/60 | HR 75 | Temp 98.2°F | Resp 16 | Ht 67.0 in | Wt 257.0 lb

## 2014-03-15 DIAGNOSIS — L02619 Cutaneous abscess of unspecified foot: Secondary | ICD-10-CM

## 2014-03-15 DIAGNOSIS — L03039 Cellulitis of unspecified toe: Secondary | ICD-10-CM

## 2014-03-15 DIAGNOSIS — E119 Type 2 diabetes mellitus without complications: Secondary | ICD-10-CM

## 2014-03-15 LAB — GLUCOSE, POCT (MANUAL RESULT ENTRY): POC Glucose: 189 mg/dl — AB (ref 70–99)

## 2014-03-15 MED ORDER — GLUCOSE BLOOD VI STRP: ORAL_STRIP | Status: DC

## 2014-03-15 MED ORDER — DOXYCYCLINE HYCLATE 100 MG PO TABS: 100.0000 mg | ORAL_TABLET | Freq: Two times a day (BID) | ORAL | Status: DC

## 2014-03-15 NOTE — Progress Notes (Signed)
Subjective:    Patient ID: [name], female    DOB: 1956/01/27, 58 y.o.   MRN: [id number]  HPI [name] is a 58 y.o. female PCP: [name], MD  Hx of multiple medical problems including DM2, diabetic peripheral neuropathy, HTN, hyperlipidemia, CHF, CRI, CAD.   Toenails have been growing downward for years, ran finger on edge of L 3rd and 5th toes 1 week ago - both nails lifted up/nearly came off- no pain (but has neuropathy and minimal to no feeling.  Had husband clip them that night (still some attached at bottom part),  Lowest part of nail came off few days ago. Noticed redness in area 5 days ago.  Soaking in hot water past 4-5 days.  Sometimes less red, others more red.  Has had drainage ever since nail came off.   Hx of chronic LE swelling - takes fluid pills for this.  Legs still swollen.   Has not checked blood sugar recently. Ran out of testing strips.  Lab Results  Component Value Date   HGBA1C 7.1 12/21/2013      Review of Systems  Constitutional: Negative for fever, chills and fatigue.  Musculoskeletal: Positive for joint swelling (3rd and 5th toes. ).  Skin: Positive for color change and wound.       Objective:   Physical Exam  Vitals reviewed. Constitutional: She is oriented to person, place, and time. She appears well-developed and well-nourished. No distress.  Cardiovascular:  Unable to palpate DP pulse, but toes warm, cap refill less than 1 second at toes.   Pulmonary/Chest: Effort normal.  Musculoskeletal: She exhibits edema (le's bilaterally. ).       Left foot: She exhibits swelling. She exhibits no tenderness.       Feet:  Neurological: She is alert and oriented to person, place, and time. A sensory deficit is present.  Decr sensation to feet.   Skin: Skin is warm and dry. There is erythema.  Psychiatric: She has a normal mood and affect. Her behavior is normal.   Results for orders placed in visit on 03/15/14  GLUCOSE, POCT  (MANUAL RESULT ENTRY)      Result Value Ref Range   POC Glucose 189 (*) 70 - 99 mg/dl        Assessment & Plan:   [name] is a 58 y.o. female Cellulitis, toe - Plan: Wound culture, doxycycline (VIBRA-TABS) 100 MG tablet, DISCONTINUED: doxycycline (VIBRA-TABS) 100 MG tablet  Type II or unspecified type diabetes mellitus without mention of complication, not stated as uncontrolled - Plan: POCT glucose (manual entry), glucose blood test strip, DISCONTINUED: glucose blood test strip  L 3rd and 5th toenail avusions, unknown if traumatic from dyspmorphic nails vs infection, but now with s/sx's of cellulitis in these toes. Cap refill intact, but DDX of PVD possible as well as underlying diabetes may contribute to wound healing.   - start doxycycline, wound care, culture obtained.   -refilled testing strips - check CBG few times per day, especially during illness. Has routine follow up appt with Dr. Tamala Julian in few wks.   - keep clean, covered, elevate legs as able and recheck in 2 days with myself or Dr. Tamala Julian - sooner if worse.     Meds ordered this encounter  Medications  . DISCONTD: doxycycline (VIBRA-TABS) 100 MG tablet    Sig: Take 1 tablet (100 mg total) by mouth 2 (two) times daily.    Dispense:  20 tablet  Refill:  0  . DISCONTD: glucose blood test strip    Sig: Use as instructed    Dispense:  100 each    Refill:  4    supplement with brand of patient's meter as needed.  . doxycycline (VIBRA-TABS) 100 MG tablet    Sig: Take 1 tablet (100 mg total) by mouth 2 (two) times daily.    Dispense:  20 tablet    Refill:  0  . glucose blood test strip    Sig: Use as instructed    Dispense:  100 each    Refill:  4    supplement with brand of patient's meter as needed.   Patient Instructions  Start antibiotic as discussed, keep toes clean/covered when not soaking 4 times per day and elevated legs as able. Recheck with Dr. Carlota Raspberry Thursday morning.  Return to the clinic or go to  the nearest emergency room if any of your symptoms worsen or new symptoms occur. Cellulitis Cellulitis is an infection of the skin and the tissue beneath it. The infected area is usually red and tender. Cellulitis occurs most often in the arms and lower legs.  CAUSES  Cellulitis is caused by bacteria that enter the skin through cracks or cuts in the skin. The most common types of bacteria that cause cellulitis are Staphylococcus and Streptococcus. SYMPTOMS   Redness and warmth.  Swelling.  Tenderness or pain.  Fever. DIAGNOSIS  Your caregiver can usually determine what is wrong based on a physical exam. Blood tests may also be done. TREATMENT  Treatment usually involves taking an antibiotic medicine. HOME CARE INSTRUCTIONS   Take your antibiotics as directed. Finish them even if you start to feel better.  Keep the infected arm or leg elevated to reduce swelling.  Apply a warm cloth to the affected area up to 4 times per day to relieve pain.  Only take over-the-counter or prescription medicines for pain, discomfort, or fever as directed by your caregiver.  Keep all follow-up appointments as directed by your caregiver. SEEK MEDICAL CARE IF:   You notice red streaks coming from the infected area.  Your red area gets larger or turns dark in color.  Your bone or joint underneath the infected area becomes painful after the skin has healed.  Your infection returns in the same area or another area.  You notice a swollen bump in the infected area.  You develop new symptoms. SEEK IMMEDIATE MEDICAL CARE IF:   You have a fever.  You feel very sleepy.  You develop vomiting or diarrhea.  You have a general ill feeling (malaise) with muscle aches and pains. MAKE SURE YOU:   Understand these instructions.  Will watch your condition.  Will get help right away if you are not doing well or get worse. Document Released: 07/03/2005 Document Revised: 03/24/2012 Document Reviewed:  12/09/2011 [facility] Patient Information 2014 [place].

## 2014-03-15 NOTE — Patient Instructions (Signed)
Start antibiotic as discussed, keep toes clean/covered when not soaking 4 times per day and elevated legs as able. Recheck with Dr. Carlota Raspberry Thursday morning.  Return to the clinic or go to the nearest emergency room if any of your symptoms worsen or new symptoms occur. Cellulitis Cellulitis is an infection of the skin and the tissue beneath it. The infected area is usually red and tender. Cellulitis occurs most often in the arms and lower legs.  CAUSES  Cellulitis is caused by bacteria that enter the skin through cracks or cuts in the skin. The most common types of bacteria that cause cellulitis are Staphylococcus and Streptococcus. SYMPTOMS   Redness and warmth.  Swelling.  Tenderness or pain.  Fever. DIAGNOSIS  Your caregiver can usually determine what is wrong based on a physical exam. Blood tests may also be done. TREATMENT  Treatment usually involves taking an antibiotic medicine. HOME CARE INSTRUCTIONS   Take your antibiotics as directed. Finish them even if you start to feel better.  Keep the infected arm or leg elevated to reduce swelling.  Apply a warm cloth to the affected area up to 4 times per day to relieve pain.  Only take over-the-counter or prescription medicines for pain, discomfort, or fever as directed by your caregiver.  Keep all follow-up appointments as directed by your caregiver. SEEK MEDICAL CARE IF:   You notice red streaks coming from the infected area.  Your red area gets larger or turns dark in color.  Your bone or joint underneath the infected area becomes painful after the skin has healed.  Your infection returns in the same area or another area.  You notice a swollen bump in the infected area.  You develop new symptoms. SEEK IMMEDIATE MEDICAL CARE IF:   You have a fever.  You feel very sleepy.  You develop vomiting or diarrhea.  You have a general ill feeling (malaise) with muscle aches and pains. MAKE SURE YOU:   Understand these  instructions.  Will watch your condition.  Will get help right away if you are not doing well or get worse. Document Released: 07/03/2005 Document Revised: 03/24/2012 Document Reviewed: 12/09/2011 [facility] Patient Information 2014 [name].

## 2014-03-17 ENCOUNTER — Ambulatory Visit (INDEPENDENT_AMBULATORY_CARE_PROVIDER_SITE_OTHER): Payer: Medicare Other | Admitting: Family Medicine

## 2014-03-17 ENCOUNTER — Other Ambulatory Visit: Payer: Self-pay

## 2014-03-17 VITALS — BP 128/60 | HR 73 | Temp 98.1°F | Resp 18 | Ht 67.0 in | Wt 257.0 lb

## 2014-03-17 DIAGNOSIS — L03032 Cellulitis of left toe: Secondary | ICD-10-CM

## 2014-03-17 DIAGNOSIS — E119 Type 2 diabetes mellitus without complications: Secondary | ICD-10-CM

## 2014-03-17 DIAGNOSIS — Z23 Encounter for immunization: Secondary | ICD-10-CM

## 2014-03-17 DIAGNOSIS — G609 Hereditary and idiopathic neuropathy, unspecified: Secondary | ICD-10-CM

## 2014-03-17 DIAGNOSIS — L02619 Cutaneous abscess of unspecified foot: Secondary | ICD-10-CM

## 2014-03-17 DIAGNOSIS — G629 Polyneuropathy, unspecified: Secondary | ICD-10-CM

## 2014-03-17 DIAGNOSIS — L03039 Cellulitis of unspecified toe: Secondary | ICD-10-CM

## 2014-03-17 MED ORDER — MUPIROCIN 2 % EX OINT: 1.0000 "application " | TOPICAL_OINTMENT | Freq: Three times a day (TID) | CUTANEOUS | Status: DC

## 2014-03-17 MED ORDER — GLUCOSE BLOOD VI STRP: ORAL_STRIP | Status: DC

## 2014-03-17 MED ORDER — ZOSTER VACCINE LIVE 19400 UNT/0.65ML ~~LOC~~ SOLR: 0.6500 mL | Freq: Once | SUBCUTANEOUS | Status: DC

## 2014-03-17 NOTE — Progress Notes (Signed)
Subjective:    Patient ID: [name], female    DOB: May 26, 1956, 58 y.o.   MRN: [id number]  HPI  [name] is a 58 y.o. female  Here for follow up cellulitis of 5th greater than 3rd toes after avulsion of toenails week prior. See last ov 2 days ago - Started on doxycycline 100mg  BID, has been using warm water soaks 3-4 times per day, and trying to keep elevated. No f/c, no n/v or other intolerance to doxy. Applying neosporin to wounds.   Also would like shingles shot.  Had chicken pox in past, no shingles shot. No vaccine reactions.  Unsure if insurance covers it, but will check.   Patient Active Problem List   Diagnosis Date Noted  . Morbid obesity 10/26/2013  . Routine general medical examination at a health care facility 04/29/2013  . Diarrhea 04/29/2013  . Essential hypertension, benign 04/29/2013  . Renal insufficiency 04/19/2013  . Type II or unspecified type diabetes mellitus without mention of complication, not stated as uncontrolled 12/07/2012  . Pure hypercholesterolemia 12/07/2012  . Iron deficiency anemia, unspecified 12/07/2012  . Diabetic peripheral neuropathy associated with type 2 diabetes mellitus 12/07/2012  . Chronic systolic heart failure [id number]  . Left bundle branch block 07/25/2012  . CAD (coronary artery disease) 07/25/2012  . Acute CHF (congestive heart failure) 07/21/2012  . Diabetes mellitus type 2 in obese 07/21/2012  . Hypoxia 07/21/2012  . HLD (hyperlipidemia) 07/21/2012  . Acute systolic heart failure 123XX123  . Acute myocardial infarction, subendocardial infarction, initial episode of care 07/21/2012   Past Medical History  Diagnosis Date  . Pure hypercholesterolemia   . Obesity, unspecified   . Unspecified vitamin D deficiency   . Pure hypercholesterolemia   . Type II or unspecified type diabetes mellitus without mention of complication, not stated as uncontrolled   . Neuropathy 2011  . Chicken pox   . Coronary artery  disease   . CHF (congestive heart failure) 07/19/2012    EF 15% by cardiac catheterization.  Admission.  . Myocardial infarct 07/19/12    s/p cardiac catheterization with stenting to LAD.  Marland Kitchen Hypertension   . Anemia    Past Surgical History  Procedure Laterality Date  . Lumbar back surgery  1996    L4-5  . Groin abscess  2007    I & D with ICU stay due to sepsis.  . Gallbladder surgery  2011  . Total abdominal hysterectomy  01/2011  . Bilateral oophorectomy  01/2011    ovarian cyst benign  . Admission  07/19/12    new onset systolic CHF; elevated troponins.  Cardiac catheterization with stenting to LAD; EF 15%.  2D-echo: EF 20-25%.  . Cholecystectomy    . Tubal ligation    . Cardiac catheterization Left    No Known Allergies Prior to Admission medications   Medication Sig Start Date End Date Taking? Authorizing Provider  aspirin EC 81 MG tablet Take 1 tablet (81 mg total) by mouth daily. 12/10/12  Yes Jolaine Artist, MD  carvedilol (COREG) 25 MG tablet Take 1 tablet (25 mg total) by mouth 2 (two) times daily with a meal. 10/26/13  Yes Rande Brunt, NP  clopidogrel (PLAVIX) 75 MG tablet Take 1 tablet (75 mg total) by mouth daily. 07/19/13  Yes Jolaine Artist, MD  doxycycline (VIBRA-TABS) 100 MG tablet Take 1 tablet (100 mg total) by mouth 2 (two) times daily. 03/15/14  Yes Wendie Agreste, MD  gabapentin (  NEURONTIN) 600 MG tablet Take 1 tablet (600 mg total) by mouth 3 (three) times daily. 11/24/13  Yes Wardell Honour, MD  glimepiride (AMARYL) 4 MG tablet Take 1 tablet (4 mg total) by mouth 2 (two) times daily before a meal. 11/16/13  Yes Wardell Honour, MD  glucose blood test strip Use as instructed 03/15/14  Yes Wendie Agreste, MD  hydrALAZINE (APRESOLINE) 50 MG tablet Take 1 tablet (50 mg total) by mouth 3 (three) times daily. 11/23/13  Yes Jolaine Artist, MD  HYDROcodone-acetaminophen (NORCO) 5-325 MG per tablet Take 1 tablet by mouth every 6 (six) hours as needed. 12/25/13   Yes Darlyne Russian, MD  isosorbide mononitrate (IMDUR) 60 MG 24 hr tablet Take 1 tablet (60 mg total) by mouth daily. 10/26/13  Yes Rande Brunt, NP  ketoconazole (NIZORAL) 2 % cream Apply 1 application topically 2 (two) times daily. 10/04/13  Yes Wardell Honour, MD  metFORMIN (GLUCOPHAGE) 1000 MG tablet Take 1 tablet (1,000 mg total) by mouth 2 (two) times daily with a meal. 11/19/13  Yes Wardell Honour, MD  nitroGLYCERIN (NITROSTAT) 0.4 MG SL tablet Place 1 tablet (0.4 mg total) under the tongue every 5 (five) minutes as needed for chest pain. 07/26/12  Yes Coral Spikes, DO  nortriptyline (PAMELOR) 50 MG capsule Take 1 capsule (50 mg total) by mouth at bedtime. 12/21/13  Yes Wardell Honour, MD  potassium chloride SA (K-DUR,KLOR-CON) 20 MEQ tablet Take 20 mEq by mouth daily as needed. Take only when take Torsemide   Yes Historical Provider, MD  pravastatin (PRAVACHOL) 40 MG tablet Take 1 tablet (40 mg total) by mouth at bedtime. 11/26/13  Yes Shaune Pascal Bensimhon, MD  sitaGLIPtin (JANUVIA) 50 MG tablet Take 1 tablet (50 mg total) by mouth daily. 12/21/13  Yes Wardell Honour, MD  torsemide (DEMADEX) 20 MG tablet Take 20 mg by mouth daily as needed.   Yes Historical Provider, MD  traMADol (ULTRAM) 50 MG tablet Take 1 tablet (50 mg total) by mouth every 6 (six) hours as needed. For pain 12/21/13  Yes Wardell Honour, MD   History   Social History  . Marital Status: Married    Spouse Name: N/A    Number of Children: 2  . Years of Education: 12   Occupational History  . disabled     03/2010 for peripheral neuropathy  . home daycare     x 20 yrs.   Social History Main Topics  . Smoking status: Never Smoker   . Smokeless tobacco: Not on file  . Alcohol Use: No  . Drug Use: No  . Sexual Activity: Not on file   Other Topics Concern  . Not on file   Social History Narrative   Marital status:Married x 40 yrs. Happily married, no abuse.       Children:  2 children (daughter 99, son 6). One  grandson and 2 step grandchildren. Daughter and grandson live in home with pt and spouse.       Lives: with husband, daughter, grandson.      Employment: disability for peripheral neuropathy 2012.       Tobacco: none      Alcohol: none      Drugs: none      Exercise: none      Pets: dog.      Always uses seat belts, smoke detectors in home.      No guns in the home. Caffeine  use: 2 cups coffee per day.       Nutrition: Well balanced diet.      Review of Systems  Constitutional: Negative for fever and chills.  Musculoskeletal: Positive for joint swelling.  Skin: Positive for color change.       Objective:   Physical Exam  Vitals reviewed. Constitutional: She is oriented to person, place, and time. She appears well-developed and well-nourished. No distress.  Pulmonary/Chest: Effort normal.  Musculoskeletal:       Feet:  Neurological: She is alert and oriented to person, place, and time.  Skin: Skin is warm and dry. No rash noted.  Psychiatric: She has a normal mood and affect. Her behavior is normal.   Filed Vitals:   03/17/14 1131  BP: 128/60  Pulse: 73  Temp: 98.1 F (36.7 C)  TempSrc: Oral  Resp: 18  Height: 5\' 7"  (1.702 m)  Weight: 257 lb (116.574 kg)  SpO2: 97%   Culture pending.  2nd MD exam - Dr. Linna Darner, to also follow up in 2 days.     Assessment & Plan:   NATASSIA HUMES is a 58 y.o. female Cellulitis of toe of left foot, Peripheral neuropathy, underlying DM2 (diabetes mellitus, type 2)  - 3rd  toe improved, 5th toe now with bullous/blistered areas - unroofed today. New cx obtained. Change to bactroban ointment - stop neosporin. Wound care discussed - clean, covered, soaks, and elevate. recehck in 2 days with Dr. Linna Darner.   Need for shingles vaccine - Plan: zoster vaccine live, PF, (ZOSTAVAX) 53664 UNT/0.65ML injection  - rx given, but to check with insurance co. To determine coverage.    Meds ordered this encounter  Medications  . zoster vaccine  live, PF, (ZOSTAVAX) 40347 UNT/0.65ML injection    Sig: Inject 19,400 Units into the skin once.    Dispense:  1 each    Refill:  0   Patient Instructions  Your 3rd toe appears better, but the 5th toe is still swollen and appears infected, now with the blisters - these were opened today to let them drain. Stop neosporin, start bactroban - 2-3 times per day and when changing bandage. Soaks 2-3 times per day, and recheck with Dr. Linna Darner in 2 days.  Return to the clinic or go to the nearest emergency room if any of your symptoms worsen or new symptoms occur.

## 2014-03-17 NOTE — Progress Notes (Signed)
   Patient ID: [name] MRN: [id number], DOB: [birth year], 58 y.o. Date of Encounter: 03/17/2014, 1:49 PM    PROCEDURE NOTE: Verbal consent obtained. Risks and benefits of the procedure were explained to the patient. Patient made an informed decision to proceed with the procedure. Betadine prep per usual protocol. Inferior lesion unroofed with 20 gauge needle causing serosanguinous fluid to be expressed.    Culture taken. No purulence expressed. Superior lesion unroofed and found to be just peeling and macerated tissue.  Bactroban applied.  Dressed. Wound care instructions including precautions with patient. Patient tolerated the procedure well. Recheck in 48 hours.      Signed, Christell Faith, MHS, PA-C [facility],  [id number] [facility] 03/17/2014 1:49 PM

## 2014-03-17 NOTE — Patient Instructions (Addendum)
Your 3rd toe appears better, but the 5th toe is still swollen and appears infected, now with the blisters - these were opened today to let them drain. Stop neosporin, start bactroban - 2-3 times per day and when changing bandage. Soaks 2-3 times per day, and recheck with Dr. Linna Darner in 2 days.  Return to the clinic or go to the nearest emergency room if any of your symptoms worsen or new symptoms occur.

## 2014-03-18 LAB — WOUND CULTURE: GRAM STAIN: NONE SEEN

## 2014-03-19 ENCOUNTER — Ambulatory Visit (INDEPENDENT_AMBULATORY_CARE_PROVIDER_SITE_OTHER): Payer: Medicare Other | Admitting: Family Medicine

## 2014-03-19 VITALS — BP 130/54 | HR 75 | Temp 97.7°F | Resp 18 | Ht 67.0 in | Wt 257.0 lb

## 2014-03-19 DIAGNOSIS — E119 Type 2 diabetes mellitus without complications: Secondary | ICD-10-CM

## 2014-03-19 DIAGNOSIS — L089 Local infection of the skin and subcutaneous tissue, unspecified: Secondary | ICD-10-CM

## 2014-03-19 NOTE — Patient Instructions (Signed)
Continue current treatment.  Return in about 4 days for recheck  Return sooner at any time if worse  If you complete your medications and it is not really resolved come back in for a recheck.

## 2014-03-19 NOTE — Progress Notes (Signed)
Subjective: 58 year old lady who has been having problems with infection of her left fifth toe primarily, some of the third and fourth. She has been being followed by Dr. Nyoka Cowden who asked me to look at her with him a couple of days ago. She is on doxycycline 100 mg twice daily and using Bactroban ointment. Last time he was able to get a lot of watery the discharge she drained out from underneath the blistered skin. She feels like it is doing some better.  Objective: Infected left fifth her looks much better. It is still reddish and has some swelling. The nail plate has a little oozing on it. The blisters or not apparent now.  Assessment: Infected left fifth toe  Plan: Continue current treatment. If not looking dramatically better over the next 4 days or so she is to return for a recheck, and can come in any time if she thinks is looking worse before then. Continue current treatment I think it will resolve completely.

## 2014-03-20 LAB — WOUND CULTURE
GRAM STAIN: NONE SEEN
GRAM STAIN: NONE SEEN
GRAM STAIN: NONE SEEN
Organism ID, Bacteria: NO GROWTH

## 2014-03-23 ENCOUNTER — Ambulatory Visit (INDEPENDENT_AMBULATORY_CARE_PROVIDER_SITE_OTHER): Payer: Medicare Other | Admitting: Family Medicine

## 2014-03-23 VITALS — BP 102/58 | HR 68 | Temp 97.4°F | Resp 16 | Ht 67.0 in | Wt 257.0 lb

## 2014-03-23 DIAGNOSIS — L03032 Cellulitis of left toe: Secondary | ICD-10-CM

## 2014-03-23 DIAGNOSIS — L03039 Cellulitis of unspecified toe: Secondary | ICD-10-CM

## 2014-03-23 DIAGNOSIS — L02619 Cutaneous abscess of unspecified foot: Secondary | ICD-10-CM

## 2014-03-23 NOTE — Patient Instructions (Signed)
Continue doxycycline until finished, and bactroban for next 5-7 days.  If any increase in redness, swelling or new blister areas - return for recheck.  Keep appointment with Dr. Tamala Julian as scheduled next week.

## 2014-03-23 NOTE — Progress Notes (Signed)
Subjective:  This chart was scribed for Vickie Ray, MD by Roxan Diesel, Scribe.  This patient was seen in [place] [facility] and the patient's care was started at 11:51 AM.   Patient ID: [name], female    DOB: 1955-12-10, 58 y.o.   MRN: [id number]   HPI  [name] is a 58 y.o. female PCP: [name], MD   Pt with h/o multiple medical problems including diabetes presents for a f/u on cellulitis of toes of left foot.  Treated initially with doxycycline, warm water soaks.  She had a wound culture initially from 6/9 which showed staph aureus and sensitive to all tested antibiotics except for penicillin.  Added Bactroban on 6/11.  Repeat wound culture on 6/11 showed no growth.  Seen in f/u by Dr. Linna Darner 4 days ago, improving at that visit.    She states her toes are improving.  She is still applying Bactroban 3x/day but stopped doing the warm water soaks 3 days ago.  She has approximately 3 more days left of her doxycycline.  She denies fevers or chills.   Patient Active Problem List   Diagnosis Date Noted  . Morbid obesity 10/26/2013  . Routine general medical examination at a health care facility 04/29/2013  . Diarrhea 04/29/2013  . Essential hypertension, benign 04/29/2013  . Renal insufficiency 04/19/2013  . Type II or unspecified type diabetes mellitus without mention of complication, not stated as uncontrolled 12/07/2012  . Pure hypercholesterolemia 12/07/2012  . Iron deficiency anemia, unspecified 12/07/2012  . Diabetic peripheral neuropathy associated with type 2 diabetes mellitus 12/07/2012  . Chronic systolic heart failure [id number]  . Left bundle branch block 07/25/2012  . CAD (coronary artery disease) 07/25/2012  . Acute CHF (congestive heart failure) 07/21/2012  . Diabetes mellitus type 2 in obese 07/21/2012  . Hypoxia 07/21/2012  . HLD (hyperlipidemia) 07/21/2012  . Acute systolic heart failure 123XX123  . Acute myocardial infarction,  subendocardial infarction, initial episode of care 07/21/2012    Past Medical History  Diagnosis Date  . Pure hypercholesterolemia   . Obesity, unspecified   . Unspecified vitamin D deficiency   . Pure hypercholesterolemia   . Type II or unspecified type diabetes mellitus without mention of complication, not stated as uncontrolled   . Neuropathy 2011  . Chicken pox   . Coronary artery disease   . CHF (congestive heart failure) 07/19/2012    EF 15% by cardiac catheterization.  Admission.  . Myocardial infarct 07/19/12    s/p cardiac catheterization with stenting to LAD.  Marland Kitchen Hypertension   . Anemia     Past Surgical History  Procedure Laterality Date  . Lumbar back surgery  1996    L4-5  . Groin abscess  2007    I & D with ICU stay due to sepsis.  . Gallbladder surgery  2011  . Total abdominal hysterectomy  01/2011  . Bilateral oophorectomy  01/2011    ovarian cyst benign  . Admission  07/19/12    new onset systolic CHF; elevated troponins.  Cardiac catheterization with stenting to LAD; EF 15%.  2D-echo: EF 20-25%.  . Cholecystectomy    . Tubal ligation    . Cardiac catheterization Left     No Known Allergies  Prior to Admission medications   Medication Sig Start Date End Date Taking? Authorizing Provider  aspirin EC 81 MG tablet Take 1 tablet (81 mg total) by mouth daily. 12/10/12  Yes Jolaine Artist, MD  carvedilol (COREG)  25 MG tablet Take 1 tablet (25 mg total) by mouth 2 (two) times daily with a meal. 10/26/13  Yes Rande Brunt, NP  clopidogrel (PLAVIX) 75 MG tablet Take 1 tablet (75 mg total) by mouth daily. 07/19/13  Yes Jolaine Artist, MD  doxycycline (VIBRA-TABS) 100 MG tablet Take 1 tablet (100 mg total) by mouth 2 (two) times daily. 03/15/14  Yes Wendie Agreste, MD  gabapentin (NEURONTIN) 600 MG tablet Take 1 tablet (600 mg total) by mouth 3 (three) times daily. 11/24/13  Yes Wardell Honour, MD  glimepiride (AMARYL) 4 MG tablet Take 1 tablet (4 mg total)  by mouth 2 (two) times daily before a meal. 11/16/13  Yes Wardell Honour, MD  glucose blood test strip Test blood sugar daily. Dx code: 250.00 03/17/14  Yes Wendie Agreste, MD  hydrALAZINE (APRESOLINE) 50 MG tablet Take 1 tablet (50 mg total) by mouth 3 (three) times daily. 11/23/13  Yes Jolaine Artist, MD  HYDROcodone-acetaminophen (NORCO) 5-325 MG per tablet Take 1 tablet by mouth every 6 (six) hours as needed. 12/25/13  Yes Darlyne Russian, MD  isosorbide mononitrate (IMDUR) 60 MG 24 hr tablet Take 1 tablet (60 mg total) by mouth daily. 10/26/13  Yes Rande Brunt, NP  ketoconazole (NIZORAL) 2 % cream Apply 1 application topically 2 (two) times daily. 10/04/13  Yes Wardell Honour, MD  metFORMIN (GLUCOPHAGE) 1000 MG tablet Take 1 tablet (1,000 mg total) by mouth 2 (two) times daily with a meal. 11/19/13  Yes Wardell Honour, MD  mupirocin ointment (BACTROBAN) 2 % Apply 1 application topically 3 (three) times daily. 03/17/14  Yes Wendie Agreste, MD  nitroGLYCERIN (NITROSTAT) 0.4 MG SL tablet Place 1 tablet (0.4 mg total) under the tongue every 5 (five) minutes as needed for chest pain. 07/26/12  Yes Coral Spikes, DO  nortriptyline (PAMELOR) 50 MG capsule Take 1 capsule (50 mg total) by mouth at bedtime. 12/21/13  Yes Wardell Honour, MD  potassium chloride SA (K-DUR,KLOR-CON) 20 MEQ tablet Take 20 mEq by mouth daily as needed. Take only when take Torsemide   Yes Historical Provider, MD  pravastatin (PRAVACHOL) 40 MG tablet Take 1 tablet (40 mg total) by mouth at bedtime. 11/26/13  Yes Shaune Pascal Bensimhon, MD  sitaGLIPtin (JANUVIA) 50 MG tablet Take 1 tablet (50 mg total) by mouth daily. 12/21/13  Yes Wardell Honour, MD  torsemide (DEMADEX) 20 MG tablet Take 20 mg by mouth daily as needed.   Yes Historical Provider, MD  traMADol (ULTRAM) 50 MG tablet Take 1 tablet (50 mg total) by mouth every 6 (six) hours as needed. For pain 12/21/13  Yes Wardell Honour, MD    History   Social History  . Marital  Status: Married    Spouse Name: N/A    Number of Children: 2  . Years of Education: 12   Occupational History  . disabled     03/2010 for peripheral neuropathy  . home daycare     x 20 yrs.   Social History Main Topics  . Smoking status: Never Smoker   . Smokeless tobacco: Not on file  . Alcohol Use: No  . Drug Use: No  . Sexual Activity: Not on file   Other Topics Concern  . Not on file   Social History Narrative   Marital status:Married x 40 yrs. Happily married, no abuse.       Children:  2 children (daughter 30,  son 41). One grandson and 2 step grandchildren. Daughter and grandson live in home with pt and spouse.       Lives: with husband, daughter, grandson.      Employment: disability for peripheral neuropathy 2012.       Tobacco: none      Alcohol: none      Drugs: none      Exercise: none      Pets: dog.      Always uses seat belts, smoke detectors in home.      No guns in the home. Caffeine use: 2 cups coffee per day.       Nutrition: Well balanced diet.     Review of Systems  Constitutional: Negative for fever and chills.  Musculoskeletal:       Redness and swelling in toes of left foot, improving        Objective:   Physical Exam  Nursing note and vitals reviewed. Constitutional: She is oriented to person, place, and time. She appears well-developed and well-nourished. No distress.  HENT:  Head: Normocephalic and atraumatic.  Eyes: Conjunctivae and EOM are normal.  Neck: Neck supple. No tracheal deviation present.  Cardiovascular: Normal rate.   Pulmonary/Chest: Effort normal. No respiratory distress.  Musculoskeletal: Normal range of motion.  Left 3rd toe: Minimal erythema at the distal portion of the toe. No bullae. No apparent discharge or swelling. Left 5th toe: Some swelling throughout the toe.  Minimal erythema, minimal warmth, and dry skin into the dorsum of that toe.  No active discharge or blistering.  No proximal erythema.  Neurological:  She is alert and oriented to person, place, and time.  Skin: Skin is warm and dry.  Psychiatric: She has a normal mood and affect. Her behavior is normal.     Filed Vitals:   03/23/14 1034  BP: 102/58  Pulse: 68  Temp: 97.4 F (36.3 C)  TempSrc: Oral  Resp: 16  Height: 5\' 7"  (1.702 m)  Weight: 257 lb (116.574 kg)  SpO2: 96%       Assessment & Plan:   JORJA CHAU is a 58 y.o. female Cellulitis of fifth toe of left foot  3rd toe appears resolved and 5th toe improving.  MSSA on initial wound cx, responding to doxycycline and bactroban.    - finish doxy, continue bactroban and keep clean/covered, and RTC precautions.  Keep follow up with PCP next week for physical, but if any changes in toe sx's -may need to be seen separately from physical.   No orders of the defined types were placed in this encounter.   Patient Instructions  Continue doxycycline until finished, and bactroban for next 5-7 days.  If any increase in redness, swelling or new blister areas - return for recheck.  Keep appointment with Dr. Tamala Julian as scheduled next week.     I personally performed the services described in this documentation, which was scribed in my presence. The recorded information has been reviewed and considered, and addended by me as needed.

## 2014-03-30 ENCOUNTER — Encounter: Payer: Self-pay | Admitting: Family Medicine

## 2014-03-30 ENCOUNTER — Ambulatory Visit (INDEPENDENT_AMBULATORY_CARE_PROVIDER_SITE_OTHER): Payer: Medicare Other | Admitting: Family Medicine

## 2014-03-30 ENCOUNTER — Other Ambulatory Visit: Payer: Self-pay | Admitting: Family Medicine

## 2014-03-30 VITALS — BP 102/56 | HR 76 | Temp 98.0°F | Resp 16 | Ht 67.75 in | Wt 253.0 lb

## 2014-03-30 DIAGNOSIS — R0989 Other specified symptoms and signs involving the circulatory and respiratory systems: Secondary | ICD-10-CM

## 2014-03-30 DIAGNOSIS — I2510 Atherosclerotic heart disease of native coronary artery without angina pectoris: Secondary | ICD-10-CM

## 2014-03-30 DIAGNOSIS — E1149 Type 2 diabetes mellitus with other diabetic neurological complication: Secondary | ICD-10-CM

## 2014-03-30 DIAGNOSIS — R0609 Other forms of dyspnea: Secondary | ICD-10-CM

## 2014-03-30 DIAGNOSIS — Z23 Encounter for immunization: Secondary | ICD-10-CM

## 2014-03-30 DIAGNOSIS — E119 Type 2 diabetes mellitus without complications: Secondary | ICD-10-CM

## 2014-03-30 DIAGNOSIS — E7800 Pure hypercholesterolemia, unspecified: Secondary | ICD-10-CM

## 2014-03-30 DIAGNOSIS — R0683 Snoring: Secondary | ICD-10-CM

## 2014-03-30 DIAGNOSIS — Z1211 Encounter for screening for malignant neoplasm of colon: Secondary | ICD-10-CM

## 2014-03-30 DIAGNOSIS — E1142 Type 2 diabetes mellitus with diabetic polyneuropathy: Secondary | ICD-10-CM

## 2014-03-30 DIAGNOSIS — D509 Iron deficiency anemia, unspecified: Secondary | ICD-10-CM

## 2014-03-30 DIAGNOSIS — Z0000 Encounter for general adult medical examination without abnormal findings: Secondary | ICD-10-CM

## 2014-03-30 DIAGNOSIS — I5021 Acute systolic (congestive) heart failure: Secondary | ICD-10-CM

## 2014-03-30 LAB — POCT URINALYSIS DIPSTICK
Bilirubin, UA: NEGATIVE
GLUCOSE UA: NEGATIVE
Ketones, UA: NEGATIVE
Nitrite, UA: NEGATIVE
Protein, UA: 100
SPEC GRAV UA: 1.015
UROBILINOGEN UA: 0.2
pH, UA: 6

## 2014-03-30 LAB — POCT UA - MICROSCOPIC ONLY: MUCUS UA: POSITIVE

## 2014-03-30 LAB — LIPID PANEL
CHOL/HDL RATIO: 4.6 ratio
Cholesterol: 124 mg/dL (ref 0–200)
HDL: 27 mg/dL — ABNORMAL LOW (ref >39)
LDL CALC: 19 mg/dL (ref 0–99)
Triglycerides: 390 mg/dL — ABNORMAL HIGH (ref <150)
VLDL: 78 mg/dL — ABNORMAL HIGH (ref 0–40)

## 2014-03-30 LAB — HEMOGLOBIN A1C
Hgb A1c MFr Bld: 6.7 % — ABNORMAL HIGH (ref <5.7)
Mean Plasma Glucose: 146 mg/dL — ABNORMAL HIGH (ref <117)

## 2014-03-30 NOTE — Progress Notes (Signed)
Subjective:    Patient ID: [name], female    DOB: [birth year], 58 y.o.   MRN: [id number]  03/30/2014  Annual Exam, Diabetes and Hyperlipidemia   HPI This 58 y.o. female presents for three month follow-up and for Complete Physical Examination:  Last physical 03/16/13. Pap smear by [facility] before hysterectomy.  2012. Mammogram 01/21/2014. Colonoscopy never and refuses.  Hemoccult cards per year.   TDAP 08/2010. Pneumovax 08/27/2011. Zostavax never; rx for prescription provided by Dr. Carlota Raspberry. Flu vaccine 06/2013. Hepatitis B 03/16/13 #1; overdue to #2. Eye exam scheduled next month; Dr. Bing Plume.  +glasses; no glaucoma; +cataracts. Dental exam five years ago.  Fall:  Tripped over rug at home; toe did not lift.  Patellar fracture L and R wrist avulsion fracture. No head trauma.  Completely recovered.  Still having issues with numbness in first 3 fingers and has sharp shooting pain.  Dr. Lorin Mercy has been taking care of avulsion fracture and patella fracture.  Followed by Dr. Lorin Mercy for six weeks and then developed numbness in R hand.  Nighttime awakening; worse with use.  L Toe infection: completed Doxycycline; stopped Bactroban.  Toenails fell off.    DMII: three month follow-up.    Has not taken Januvia 35m daily in the past month due to expense of medication; Januvia is going to make patient hit the donut hole early.  Since being out of Januvia, patient has increased Metformin to 1 whole tablet every morning and one 1/2 tablet every evening and she is suffering with recurrent diarrhea.    Hyperlipidemia: three month follow-up; no changes to therapy made at last visit.  Patient reports good compliance with medication, good tolerance to medication, and good symptom control.    Peripheral Neuropathy:  Three month follow-up.  No changes to management made at last visit.  Patient reports good compliance with medication, good tolerance to medication, and good symptom control.    Anemia:  stable at last visit.  No changes to therapy made at last visit. Patient reports good compliance with medication, good tolerance to medication, and good symptom control.  Denies melena or bloody stools.  No previous colonoscopy but hemosures negative last year.  CHF and CAD: having increased swelling since fall in 12/2013; not taking diuretic for swelling; not weighing daily. Denies CP/palp/SOB/orthopnea.  Last cardiology visit 10/26/13. Weight down 8 pounds in past three months.  Review of Systems  Constitutional: Negative for fever, chills, diaphoresis and fatigue.  HENT: Negative for congestion, ear pain, hearing loss, postnasal drip, rhinorrhea, sneezing, sore throat, tinnitus and trouble swallowing.   Eyes: Negative for photophobia, pain, discharge, redness, itching and visual disturbance.  Respiratory: Negative for cough, shortness of breath, wheezing and stridor.        +snores.  Cardiovascular: Positive for leg swelling. Negative for chest pain and palpitations.  Gastrointestinal: Positive for diarrhea. Negative for nausea, vomiting, abdominal pain, constipation, blood in stool, anal bleeding and rectal pain.  Endocrine: Negative for cold intolerance, heat intolerance, polydipsia, polyphagia and polyuria.  Genitourinary: Negative for dysuria, urgency, frequency, hematuria, flank pain, vaginal discharge, genital sores, vaginal pain and pelvic pain.  Musculoskeletal: Positive for arthralgias. Negative for back pain, gait problem, neck pain and neck stiffness.       Having sharp shooting pains in R hand since fall in 12/2013.  Skin: Negative for color change, pallor, rash and wound.  Neurological: Positive for weakness and numbness. Negative for dizziness, tremors, seizures, syncope, facial asymmetry, speech difficulty, light-headedness and headaches.  Hematological: Negative for adenopathy. Does not bruise/bleed easily.  Psychiatric/Behavioral: Negative for suicidal ideas, behavioral problems,  sleep disturbance, self-injury and dysphoric mood. The patient is not nervous/anxious.     Past Medical History  Diagnosis Date  . Pure hypercholesterolemia   . Obesity, unspecified   . Unspecified vitamin D deficiency   . Pure hypercholesterolemia   . Type II or unspecified type diabetes mellitus without mention of complication, not stated as uncontrolled   . Neuropathy 2011  . Chicken pox   . Coronary artery disease   . CHF (congestive heart failure) 07/19/2012    EF 15% by cardiac catheterization.  Admission.  . Myocardial infarct 07/19/12    s/p cardiac catheterization with stenting to LAD.  Marland Kitchen Hypertension   . Anemia    Past Surgical History  Procedure Laterality Date  . Lumbar back surgery  1996    L4-5  . Groin abscess  2007    I & D with ICU stay due to sepsis.  . Gallbladder surgery  2011  . Bilateral oophorectomy  01/2011    ovarian cyst benign  . Admission  07/19/12    new onset systolic CHF; elevated troponins.  Cardiac catheterization with stenting to LAD; EF 15%.  2D-echo: EF 20-25%.  . Cholecystectomy    . Tubal ligation    . Cardiac catheterization Left   . Total abdominal hysterectomy  01/2011    Fibroids/DUB.  Ovaries removed. [facility].    No Known Allergies Current Outpatient Prescriptions  Medication Sig Dispense Refill  . aspirin EC 81 MG tablet Take 1 tablet (81 mg total) by mouth daily.  90 tablet  3  . carvedilol (COREG) 25 MG tablet Take 1 tablet (25 mg total) by mouth 2 (two) times daily with a meal.  180 tablet  3  . clopidogrel (PLAVIX) 75 MG tablet Take 1 tablet (75 mg total) by mouth daily.  90 tablet  3  . gabapentin (NEURONTIN) 600 MG tablet Take 1 tablet (600 mg total) by mouth 3 (three) times daily.  270 tablet  3  . glimepiride (AMARYL) 4 MG tablet Take 1 tablet (4 mg total) by mouth 2 (two) times daily before a meal.  180 tablet  3  . glucose blood test strip Test blood sugar daily. Dx code: 250.00  100 each  4  . hydrALAZINE  (APRESOLINE) 50 MG tablet Take 1 tablet (50 mg total) by mouth 3 (three) times daily.  270 tablet  3  . isosorbide mononitrate (IMDUR) 60 MG 24 hr tablet Take 1 tablet (60 mg total) by mouth daily.  90 tablet  3  . ketoconazole (NIZORAL) 2 % cream Apply 1 application topically 2 (two) times daily.  30 g  0  . metFORMIN (GLUCOPHAGE) 1000 MG tablet Take 1 tablet (1,000 mg total) by mouth daily with breakfast.  90 tablet  3  . nitroGLYCERIN (NITROSTAT) 0.4 MG SL tablet Place 1 tablet (0.4 mg total) under the tongue every 5 (five) minutes as needed for chest pain.  30 tablet  0  . nortriptyline (PAMELOR) 50 MG capsule Take 1 capsule (50 mg total) by mouth at bedtime.  90 capsule  3  . potassium chloride SA (K-DUR,KLOR-CON) 20 MEQ tablet Take 20 mEq by mouth daily as needed. Take only when take Torsemide      . pravastatin (PRAVACHOL) 40 MG tablet Take 1 tablet (40 mg total) by mouth at bedtime.  90 tablet  3  . sitaGLIPtin (JANUVIA) 100 MG tablet  Take 1 tablet (100 mg total) by mouth daily.  90 tablet  1  . traMADol (ULTRAM) 50 MG tablet Take 1 tablet (50 mg total) by mouth every 6 (six) hours as needed. For pain  180 tablet  1  . torsemide (DEMADEX) 20 MG tablet Take 20 mg by mouth daily as needed.       No current facility-administered medications for this visit.   History   Social History  . Marital Status: Married    Spouse Name: N/A    Number of Children: 2  . Years of Education: 12   Occupational History  . disabled     03/2010 for peripheral neuropathy  . home daycare     x 20 yrs.   Social History Main Topics  . Smoking status: Never Smoker   . Smokeless tobacco: Not on file  . Alcohol Use: No  . Drug Use: No  . Sexual Activity: Yes    Birth Control/ Protection: Post-menopausal, Surgical   Other Topics Concern  . Not on file   Social History Narrative   Marital status:Married x 42 yrs. Happily married, no abuse.       Children:  2 children (daughter 89, son 54). One  grandson and 2 step grandchildren. Daughter and grandson live in home with pt and spouse.       Lives: with husband.      Employment: disability for peripheral neuropathy 2012.       Tobacco: none      Alcohol: none      Drugs: none      Exercise: none.  [employer].      Pets: dog.      Always uses seat belts, smoke detectors in home.      No guns in the home.       Caffeine use: 2 cups coffee per day.       Nutrition: Well balanced diet.   Family History  Problem Relation Age of Onset  . Diabetes Mother   . Hypertension Mother   . Arthritis Mother     knees, lumbar DDD, cervical DDD  . Cancer Father     prostate,skin,lymphoma.  . Obesity Brother   . Diabetes Maternal Grandmother   . Diabetes Paternal Grandmother         Objective:    BP 102/56  Pulse 76  Temp(Src) 98 F (36.7 C) (Oral)  Resp 16  Ht 5' 7.75" (1.721 m)  Wt 253 lb (114.76 kg)  BMI 38.75 kg/m2  SpO2 98% Physical Exam  Nursing note and vitals reviewed. Constitutional: She is oriented to person, place, and time. She appears well-developed and well-nourished. No distress.  HENT:  Head: Normocephalic and atraumatic.  Right Ear: External ear normal.  Left Ear: External ear normal.  Nose: Nose normal.  Mouth/Throat: Oropharynx is clear and moist.  Eyes: Conjunctivae and EOM are normal. Pupils are equal, round, and reactive to light.  Neck: Normal range of motion and full passive range of motion without pain. Neck supple. No JVD present. Carotid bruit is not present. No thyromegaly present.  Cardiovascular: Normal rate, regular rhythm and normal heart sounds.  Exam reveals no gallop and no friction rub.   No murmur heard. Trace pitting edema B feet.  Pulmonary/Chest: Effort normal and breath sounds normal. She has no wheezes. She has no rales. Right breast exhibits no inverted nipple, no mass, no nipple discharge, no skin change and no tenderness. Left breast exhibits no inverted  nipple, no mass,  no nipple discharge, no skin change and no tenderness. Breasts are symmetrical.  Abdominal: Soft. Bowel sounds are normal. She exhibits no distension and no mass. There is no tenderness. There is no rebound and no guarding.  Genitourinary: Rectal exam shows no external hemorrhoid, no fissure, no mass, no tenderness and anal tone normal. No breast swelling, tenderness, discharge or bleeding. Pelvic exam was performed with patient supine.  Musculoskeletal:       Right shoulder: Normal.       Left shoulder: Normal.       Cervical back: Normal.  Lymphadenopathy:    She has no cervical adenopathy.  Neurological: She is alert and oriented to person, place, and time. She has normal reflexes. No cranial nerve deficit. She exhibits normal muscle tone. Coordination normal.  Skin: Skin is warm and dry. No rash noted. She is not diaphoretic. No erythema. No pallor.  L foot/toes without swelling/erythema/induration/streaking/fluctuance.  Psychiatric: She has a normal mood and affect. Her behavior is normal. Judgment and thought content normal.   Results for orders placed in visit on 03/30/14  HEMOGLOBIN A1C      Result Value Ref Range   Hemoglobin A1C 6.7 (*) <5.7 %   Mean Plasma Glucose 146 (*) <117 mg/dL  LIPID PANEL      Result Value Ref Range   Cholesterol 124  0 - 200 mg/dL   Triglycerides 390 (*) <150 mg/dL   HDL 27 (*) >39 mg/dL   Total CHOL/HDL Ratio 4.6     VLDL 78 (*) 0 - 40 mg/dL   LDL Cholesterol 19  0 - 99 mg/dL  POCT URINALYSIS DIPSTICK      Result Value Ref Range   Color, UA yellow     Clarity, UA clear     Glucose, UA neg     Bilirubin, UA neg     Ketones, UA neg     Spec Grav, UA 1.015     Blood, UA trace     pH, UA 6.0     Protein, UA 100     Urobilinogen, UA 0.2     Nitrite, UA neg     Leukocytes, UA small (1+)    POCT UA - MICROSCOPIC ONLY      Result Value Ref Range   WBC, Ur, HPF, POC 3-6     RBC, urine, microscopic 6-8     Bacteria, U Microscopic 1+     Mucus,  UA pos     Epithelial cells, urine per micros 3-4     Crystals, Ur, HPF, POC triple phosphate     Casts, Ur, LPF, POC hyaline     Yeast, UA       HEPATITIS B#2 ADMINISTERED DURING VISIT.    Assessment & Plan:  Routine general medical examination at a health care facility - Plan: Hemoglobin A1c, Lipid panel, POCT urinalysis dipstick, POCT UA - Microscopic Only, CANCELED: Microalbumin, urine  Pure hypercholesterolemia - Plan: Lipid panel, POCT urinalysis dipstick, pravastatin (PRAVACHOL) 40 MG tablet, CANCELED: Microalbumin, urine  Type II or unspecified type diabetes mellitus without mention of complication, not stated as uncontrolled - Plan: Hemoglobin A1c, POCT urinalysis dipstick, glimepiride (AMARYL) 4 MG tablet, metFORMIN (GLUCOPHAGE) 1000 MG tablet, sitaGLIPtin (JANUVIA) 100 MG tablet, CANCELED: Microalbumin, urine  Snoring  Iron deficiency anemia, unspecified  Diabetic peripheral neuropathy associated with type 2 diabetes mellitus - Plan: gabapentin (NEURONTIN) 600 MG tablet, nortriptyline (PAMELOR) 50 MG capsule, traMADol (ULTRAM) 50 MG tablet  Need  for prophylactic vaccination and inoculation against viral hepatitis - Plan: Hepatitis B vaccine adult IM  Acute systolic heart failure - Plan: aspirin EC 81 MG tablet, hydrALAZINE (APRESOLINE) 50 MG tablet  Coronary artery disease involving native coronary artery of native heart without angina pectoris  Morbid obesity  Colon cancer screening - Plan: IFOBT POC (occult bld, rslt in office)  1. Annual Wellness Examination:  Anticipatory guidance --- weight loss, careful ambulation.  No longer warrants pap smears due to hysterectomy status. Mammogram UTD.  Colonoscopy refused again;will mail hemosure kit to patient's home.  S/p Hepatitis B#2; all other immunizations UTD.  High fall risk due to peripheral neuropathy.  No hearing loss concerns. No evidence of depression.  Moderately independent with ADLs.  FULL CODE.   2. Gynecological  exam: completed; no longer warrants pap smears due to hysterectomy for fibroids and ovaries resected. Mammogram UTD.   3.  Colon cancer sreening: patient refuses colonoscopy; will mail hemosure kit to her. 3.  DMII: controlled; will increase Januvia to 177m 1/2 tablet daily so that rx last twice as long.  Cannot tolerate higher dose of Metformin.  Eye exam scheduled for next week.  Obtain labs including urine microalbumin.  Monofilament grossly abnormal; known diabetic PN.  S/p Hepatitis B#2. 4.  Hypercholesterolemia: controlled; obtain labs; refill provided. 5.  Snoring: New. Associated with obesity, DMII, CHF, CAD, hypersomnolence.  Refer for sleep study. 6.  Iron deficiency anemia: stable; compliance with bid ferrous sulfate.  Repeat today; also obtain hemsoure. 7. CAD/CHF: stable; asymptomatic; due for follow-up with cardiology; last visit 10/2013.   8. S/p Hepatitis B#2;  RTC 3 months for Hepatitis B#3.  Indication:  DMII.   Meds ordered this encounter  Medications  . aspirin EC 81 MG tablet    Sig: Take 1 tablet (81 mg total) by mouth daily.    Dispense:  90 tablet    Refill:  3  . carvedilol (COREG) 25 MG tablet    Sig: Take 1 tablet (25 mg total) by mouth 2 (two) times daily with a meal.    Dispense:  180 tablet    Refill:  3    Order Specific Question:  Supervising Provider    Answer:  BGlori BickersR [[id number]]  . clopidogrel (PLAVIX) 75 MG tablet    Sig: Take 1 tablet (75 mg total) by mouth daily.    Dispense:  90 tablet    Refill:  3  . gabapentin (NEURONTIN) 600 MG tablet    Sig: Take 1 tablet (600 mg total) by mouth 3 (three) times daily.    Dispense:  270 tablet    Refill:  3  . glimepiride (AMARYL) 4 MG tablet    Sig: Take 1 tablet (4 mg total) by mouth 2 (two) times daily before a meal.    Dispense:  180 tablet    Refill:  3  . hydrALAZINE (APRESOLINE) 50 MG tablet    Sig: Take 1 tablet (50 mg total) by mouth 3 (three) times daily.    Dispense:  270 tablet     Refill:  3    Order Specific Question:  Supervising Provider    Answer:  BGlori BickersR [[id number]]  . metFORMIN (GLUCOPHAGE) 1000 MG tablet    Sig: Take 1 tablet (1,000 mg total) by mouth daily with breakfast.    Dispense:  90 tablet    Refill:  3  . nortriptyline (PAMELOR) 50 MG capsule    Sig: Take 1 capsule (50 mg  total) by mouth at bedtime.    Dispense:  90 capsule    Refill:  3  . pravastatin (PRAVACHOL) 40 MG tablet    Sig: Take 1 tablet (40 mg total) by mouth at bedtime.    Dispense:  90 tablet    Refill:  3  . sitaGLIPtin (JANUVIA) 100 MG tablet    Sig: Take 1 tablet (100 mg total) by mouth daily.    Dispense:  90 tablet    Refill:  1  . traMADol (ULTRAM) 50 MG tablet    Sig: Take 1 tablet (50 mg total) by mouth every 6 (six) hours as needed. For pain    Dispense:  180 tablet    Refill:  1    Return in about 3 months (around 06/30/2014) for recheck diabetes, anemia, high cholesterol, Hepatitis B#3.   [name], M.D.  [facility] [address] [place], [place]  [address] [phone] phone [phone] fax

## 2014-03-30 NOTE — Patient Instructions (Signed)
Hepatitis B Vaccine What You Need to Know WHAT IS HEPATITIS B? Hepatitis B is a serious infection that affects the liver. It is caused by the hepatitis B virus.   In 2009, about 38,000 people became infected with hepatitis B.  Each year about 2,000 to 4,000 people die in the [place] from cirrhosis or liver cancer caused by hepatitis B. Hepatitis B can cause:  Acute (short-term) illness. This can lead to:  Loss of appetite.  Diarrhea and vomiting.  Tiredness.  Jaundice (yellow skin or eyes).  Pain in muscles, joints, and stomach. Acute illness, with symptoms, is more common among adults. Children who become infected usually do not have symptoms.  Chronic (long-term) infection. Some people go on to develop chronic hepatitis B infection. Most of them do not have symptoms, but the infection is still very serious, and can lead to:  Liver damage (cirrhosis).  Liver cancer.  Death. Chronic infection is more common among infants and children than among adults. People who are chronically infected can spread hepatitis B virus to others, even if they don't look or feel sick. Up to 1.4 million people in the [place] may have chronic hepatitis B infection.  Hepatitis B virus is easily spread through contact with the blood or other body fluids of an infected person. People can also be infected from contact with a contaminated object, where the virus can live for up to 7 days.  A baby whose mother is infected can be infected at birth;  Children, adolescents, and adults can become infected by:  contact with blood and body fluids through breaks in the skin such as bites, cuts, or sores;  contact with objects that could have blood or body fluids on them such as toothbrushes, razors, or monitoring and treatment devices for diabetes;  having unprotected sex with an infected person;  sharing needles when injecting drugs;  being stuck with a used needle. HEPATITIS B VACCINE: WHY  GET VACCINATED? Hepatitis B vaccine can prevent hepatitis B, and the serious consequences of hepatitis B infection, including liver cancer and cirrhosis. Hepatitis B vaccine may be given by itself or in the same shot with other vaccines. Routine hepatitis B vaccination was recommended for some [place] adults and children beginning in 1982, and for all children in 1991. Since 1990, new hepatitis B infections among children and adolescents have dropped by more than 95%-and by 75% in other age groups. Vaccination gives long-term protection from hepatitis B infection, possibly lifelong. WHO SHOULD GET HEPATITIS B VACCINE AND WHEN? Children and Adolescents  Babies normally get 3 doses of hepatitis B vaccine:  1st Dose: Birth  2nd Dose: 84-48 months of age  60rd Dose: 50-6 months of age Some babies might get 4 doses, for example, if a combination vaccine containing hepatitis B is used. (This is a single shot containing several vaccines.) The extra dose is not harmful.  Anyone through 60 years of age who didn't get the vaccine when they were younger should also be vaccinated. Adults  All unvaccinated adults at risk for hepatitis B infection should be vaccinated. This includes:  sex partners of people infected with hepatitis B,  men who have sex with men,  people who inject street drugs,  people with more than one sex partner,  people with chronic liver or kidney disease,  people under 56 years of age with diabetes,  people with jobs that expose them to human blood or other body fluids,  household contacts of people infected with  hepatitis B,  residents and staff in institutions for the developmentally disabled,  kidney dialysis patients,  people who travel to countries where hepatitis B is common,  people with HIV infection.  Other people may be encouraged by their doctor to get hepatitis B vaccine; for example, adults 75 and older with diabetes. Anyone else who wants to be protected  from hepatitis B infection may get the vaccine.  Pregnant women who are at risk for one of the reasons stated above should be vaccinated. Other pregnant women who want protection may be vaccinated. Adults getting hepatitis B vaccine should get 3 doses-with the second dose given 4 weeks after the first and the third dose 5 months after the second. Your doctor can tell you about other dosing schedules that might be used in certain circumstances. WHO SHOULD NOT GET HEPATITIS B VACCINE?  Anyone with a life-threatening allergy to yeast, or to any other component of the vaccine, should not get hepatitis B vaccine. Tell your doctor if you have any severe allergies.  Anyone who has had a life-threatening allergic reaction to a previous dose of hepatitis B vaccine should not get another dose.  Anyone who is moderately or severely ill when a dose of vaccine is scheduled should probably wait until they recover before getting the vaccine. Your doctor can give you more information about these precautions. Note: You might be asked to wait 28 days before donating blood after getting hepatitis B vaccine. This is because the screening test could mistake vaccine in the bloodstream (which is not infectious) for hepatitis B infection. WHAT ARE THE RISKS FROM HEPATITIS B VACCINE? Hepatitis B is a very safe vaccine. Most people do not have any problems with it. The vaccine contains non-infectious material, and cannot cause hepatitis B infection. Some mild problems have been reported:  Soreness where the shot was given (up to about 1 person in 4).  Temperature of 99.9 F or higher (up to about 1 person in 15). Severe problems are extremely rare. Severe allergic reactions are believed to occur about once in 1.1 million doses. A vaccine, like any medicine, could cause a serious reaction. But the risk of a vaccine causing serious harm, or death, is extremely small. More than 100 million people in the [place] have  been vaccinated with hepatitis B vaccine. WHAT IF THERE IS A MODERATE OR SEVERE REACTION? What should I look for?  Any unusual condition, such as a high fever or unusual behavior. Signs of a serious allergic reaction can include difficulty breathing, hoarseness or wheezing, hives, paleness, weakness, a fast heartbeat, or dizziness. What should I do?  Call your doctor, or get the person to a doctor right away.  Tell your doctor what happened, the date and time it happened, and when the vaccination was given.  Ask your doctor, nurse, or health department to report the reaction by filing a Vaccine Adverse Event Reporting System (VAERS) form. Or you can file this report through the VAERS website at www.vaers.SamedayNews.es, or by calling (986) 446-3797. VAERS does not provide medical advice. THE NATIONAL VACCINE INJURY COMPENSATION PROGRAM The National Vaccine Injury Compensation Program (VICP) was created in 1986. Persons who believe they may have been injured by a vaccine can learn about the program and about filing a claim by calling (820)405-9043 or visiting the [place] website at GoldCloset.com.ee [place]?  Ask your doctor. They can give you the vaccine package insert or suggest other sources of information.  Call your local  or state health department.  Contact the Centers for Disease Control and Prevention (CDC):  Call 212-064-0395 (1-800-CDC-INFO)  or  Visit CDC's website at: http://hunter.com/ CDC Hepatitis B Interim VIS (11/08/10) Document Released: 07/18/2006 Document Revised: 12/16/2011 Document Reviewed: 01/12/2013 [facility] Patient Information 2015 [place], [place]. This information is not intended to replace advice given to you by your health care provider. Make sure you discuss any questions you have with your health care provider.

## 2014-03-31 MED ORDER — HYDRALAZINE HCL 50 MG PO TABS: 50.0000 mg | ORAL_TABLET | Freq: Three times a day (TID) | ORAL | Status: DC

## 2014-03-31 MED ORDER — NORTRIPTYLINE HCL 50 MG PO CAPS: 50.0000 mg | ORAL_CAPSULE | Freq: Every day | ORAL | Status: DC

## 2014-03-31 MED ORDER — METFORMIN HCL 1000 MG PO TABS: 1000.0000 mg | ORAL_TABLET | Freq: Every day | ORAL | Status: DC

## 2014-03-31 MED ORDER — GLIMEPIRIDE 4 MG PO TABS: 4.0000 mg | ORAL_TABLET | Freq: Two times a day (BID) | ORAL | Status: DC

## 2014-03-31 MED ORDER — CLOPIDOGREL BISULFATE 75 MG PO TABS: 75.0000 mg | ORAL_TABLET | Freq: Every day | ORAL | Status: DC

## 2014-03-31 MED ORDER — PRAVASTATIN SODIUM 40 MG PO TABS: 40.0000 mg | ORAL_TABLET | Freq: Every day | ORAL | Status: DC

## 2014-03-31 MED ORDER — ASPIRIN EC 81 MG PO TBEC: 81.0000 mg | DELAYED_RELEASE_TABLET | Freq: Every day | ORAL | Status: DC

## 2014-03-31 MED ORDER — GABAPENTIN 600 MG PO TABS: 600.0000 mg | ORAL_TABLET | Freq: Three times a day (TID) | ORAL | Status: DC

## 2014-03-31 MED ORDER — SITAGLIPTIN PHOSPHATE 100 MG PO TABS: 100.0000 mg | ORAL_TABLET | Freq: Every day | ORAL | Status: DC

## 2014-03-31 MED ORDER — CARVEDILOL 25 MG PO TABS: 25.0000 mg | ORAL_TABLET | Freq: Two times a day (BID) | ORAL | Status: DC

## 2014-03-31 MED ORDER — TRAMADOL HCL 50 MG PO TABS: 50.0000 mg | ORAL_TABLET | Freq: Four times a day (QID) | ORAL | Status: DC | PRN

## 2014-04-01 LAB — COMPREHENSIVE METABOLIC PANEL
ALBUMIN: 4.1 g/dL (ref 3.5–5.2)
ALT: 10 U/L (ref 0–35)
AST: 16 U/L (ref 0–37)
Alkaline Phosphatase: 73 U/L (ref 39–117)
BILIRUBIN TOTAL: 0.4 mg/dL (ref 0.2–1.2)
BUN: 16 mg/dL (ref 6–23)
CHLORIDE: 101 meq/L (ref 96–112)
CO2: 27 meq/L (ref 19–32)
Calcium: 9 mg/dL (ref 8.4–10.5)
Creat: 1.15 mg/dL — ABNORMAL HIGH (ref 0.50–1.10)
GLUCOSE: 201 mg/dL — AB (ref 70–99)
POTASSIUM: 5.1 meq/L (ref 3.5–5.3)
Sodium: 138 mEq/L (ref 135–145)
TOTAL PROTEIN: 7.1 g/dL (ref 6.0–8.3)

## 2014-04-01 LAB — CBC
HCT: 36 % (ref 36.0–46.0)
HEMOGLOBIN: 11.9 g/dL — AB (ref 12.0–15.0)
MCH: 29.4 pg (ref 26.0–34.0)
MCHC: 33.1 g/dL (ref 30.0–36.0)
MCV: 88.9 fL (ref 78.0–100.0)
Platelets: 219 10*3/uL (ref 150–400)
RBC: 4.05 MIL/uL (ref 3.87–5.11)
RDW: 14.4 % (ref 11.5–15.5)
WBC: 7.7 10*3/uL (ref 4.0–10.5)

## 2014-04-05 NOTE — Progress Notes (Signed)
This encounter was created in error - please disregard.

## 2014-04-18 ENCOUNTER — Other Ambulatory Visit (HOSPITAL_COMMUNITY): Payer: Self-pay

## 2014-04-27 ENCOUNTER — Other Ambulatory Visit (HOSPITAL_COMMUNITY): Payer: Self-pay | Admitting: Cardiology

## 2014-04-27 NOTE — Telephone Encounter (Signed)
Pharmacy requested digoxin Returned fax med was d/c by Dr.Bensimhon 07/2013

## 2014-05-04 ENCOUNTER — Ambulatory Visit (HOSPITAL_BASED_OUTPATIENT_CLINIC_OR_DEPARTMENT_OTHER)
Admission: RE | Admit: 2014-05-04 | Discharge: 2014-05-04 | Disposition: A | Discharge status: Home / Self Care | Payer: Medicare Other | Source: Ambulatory Visit | Attending: Cardiology | Admitting: Cardiology

## 2014-05-04 ENCOUNTER — Ambulatory Visit (HOSPITAL_COMMUNITY)
Admission: RE | Admit: 2014-05-04 | Discharge: 2014-05-04 | Disposition: A | Discharge status: Home / Self Care | Payer: Medicare Other | Source: Ambulatory Visit | Attending: Family Medicine | Admitting: Family Medicine

## 2014-05-04 ENCOUNTER — Encounter (HOSPITAL_COMMUNITY): Payer: Self-pay

## 2014-05-04 VITALS — BP 114/58 | HR 79 | Wt 268.8 lb

## 2014-05-04 DIAGNOSIS — I447 Left bundle-branch block, unspecified: Secondary | ICD-10-CM

## 2014-05-04 DIAGNOSIS — I059 Rheumatic mitral valve disease, unspecified: Secondary | ICD-10-CM

## 2014-05-04 DIAGNOSIS — I252 Old myocardial infarction: Secondary | ICD-10-CM | POA: Diagnosis not present

## 2014-05-04 DIAGNOSIS — I10 Essential (primary) hypertension: Secondary | ICD-10-CM | POA: Diagnosis not present

## 2014-05-04 DIAGNOSIS — E785 Hyperlipidemia, unspecified: Secondary | ICD-10-CM | POA: Diagnosis not present

## 2014-05-04 DIAGNOSIS — I2510 Atherosclerotic heart disease of native coronary artery without angina pectoris: Secondary | ICD-10-CM | POA: Insufficient documentation

## 2014-05-04 DIAGNOSIS — E7800 Pure hypercholesterolemia, unspecified: Secondary | ICD-10-CM

## 2014-05-04 DIAGNOSIS — I509 Heart failure, unspecified: Secondary | ICD-10-CM | POA: Diagnosis not present

## 2014-05-04 DIAGNOSIS — E119 Type 2 diabetes mellitus without complications: Secondary | ICD-10-CM | POA: Insufficient documentation

## 2014-05-04 DIAGNOSIS — I5022 Chronic systolic (congestive) heart failure: Secondary | ICD-10-CM

## 2014-05-04 DIAGNOSIS — I519 Heart disease, unspecified: Secondary | ICD-10-CM

## 2014-05-04 MED ORDER — TORSEMIDE 20 MG PO TABS: 20.0000 mg | ORAL_TABLET | ORAL | Status: DC

## 2014-05-04 NOTE — Patient Instructions (Signed)
INCREASE Torsemide to 20 mg every other day INCREASE Hydralazine to THREE times a week  Labs needed in 2 weeks (BMET)  Your physician recommends that you schedule a follow-up appointment in: 6 weeks  You have been referred to [facility] (Aterial Doppler and Electrophysiology)     [place] Sp[place]iel [phone] Do the following things EVERYDAY: 1) Weigh yourself in the morning before breakfast. Write it down and keep it in a log. 2) Take your medicines as prescribed 3) Eat low salt foods-Limit salt (sodium) to 2000 mg per day.  4) Stay as active as you can everyday 5) Limit all fluids for the day to less than 2 liters 6)

## 2014-05-04 NOTE — Progress Notes (Signed)
Echo Lab  2D Echocardiogram completed.  [name] [name], RDCS 05/04/2014 1:25 PM

## 2014-05-05 DIAGNOSIS — I519 Heart disease, unspecified: Secondary | ICD-10-CM | POA: Insufficient documentation

## 2014-05-05 NOTE — Progress Notes (Signed)
Patient ID: [name], female   DOB: 05-May-1956, 58 y.o.   MRN: [id number]   Weight Range   275 pounds  Baseline proBNP   5000.6    HPI: [name] is a 58 y.o. female with past medical history of HL, morbid obesity, chronic pain and DM since she was 58 years old. She had neuropathy in 2011 causing her to be in a wheelchair, this recovered after 8 months of therapy.   Admitted to [facility] 10/13 with progressive dyspnea and lower extremity edema.  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  Underwent complex PTCA and stenting of LAD by Dr. Burt Knack.  She was placed on Brilinta and ASA for 1 year, now on Plavix and ASA.    She has been unable to tolerate spironolactone or lisinopril due to hyperkalemia.  ECHO 10/29/12 EF 35-40%  ECHO 03/11/13 EF 30% ECHO 7/15 EF 30%, inferior akinesis with global hypokinesis, mild MR, normal RV.    Follow up:  Stable since last appointment.  She has not been taking torsemide.  She can walk around her house without problems.  She is short of breath walking 1/2 block.  No orthopnea or PND.  No chest pain.  She has generalized fatigue and says that her legs feel week.  Weight is up 6 lbs since last appointment.   ECG: NSR, LBBB QRS 190 msec  ROS: All systems negative except as listed in HPI, PMH and Problem List.  Past Medical History  Diagnosis Date  . Pure hypercholesterolemia   . Obesity, unspecified   . Unspecified vitamin D deficiency   . Pure hypercholesterolemia   . Type II or unspecified type diabetes mellitus without mention of complication, not stated as uncontrolled   . Neuropathy 2011  . Chicken pox   . Coronary artery disease   . CHF (congestive heart failure) 07/19/2012    EF 15% by cardiac catheterization.  Admission.  . Myocardial infarct 07/19/12    s/p cardiac catheterization with stenting to LAD.  Marland Kitchen Hypertension   . Anemia     Current Outpatient Prescriptions   Medication Sig Dispense Refill  . aspirin EC 81 MG tablet Take 1 tablet (81 mg total) by mouth daily.  90 tablet  3  . carvedilol (COREG) 25 MG tablet Take 1 tablet (25 mg total) by mouth 2 (two) times daily with a meal.  180 tablet  3  . clopidogrel (PLAVIX) 75 MG tablet Take 1 tablet (75 mg total) by mouth daily.  90 tablet  3  . gabapentin (NEURONTIN) 600 MG tablet Take 1 tablet (600 mg total) by mouth 3 (three) times daily.  270 tablet  3  . glimepiride (AMARYL) 4 MG tablet Take 1 tablet (4 mg total) by mouth 2 (two) times daily before a meal.  180 tablet  3  . glucose blood test strip Test blood sugar daily. Dx code: 250.00  100 each  4  . hydrALAZINE (APRESOLINE) 50 MG tablet Take 1 tablet (50 mg total) by mouth 3 (three) times daily.  270 tablet  3  . isosorbide mononitrate (IMDUR) 60 MG 24 hr tablet Take 1 tablet (60 mg total) by mouth daily.  90 tablet  3  . ketoconazole (NIZORAL) 2 % cream Apply 1 application topically 2 (two) times daily.  30 g  0  . metFORMIN (GLUCOPHAGE) 1000 MG tablet Take 1 tablet (1,000 mg total) by mouth daily  with breakfast.  90 tablet  3  . nitroGLYCERIN (NITROSTAT) 0.4 MG SL tablet Place 1 tablet (0.4 mg total) under the tongue every 5 (five) minutes as needed for chest pain.  30 tablet  0  . nortriptyline (PAMELOR) 50 MG capsule Take 1 capsule (50 mg total) by mouth at bedtime.  90 capsule  3  . potassium chloride SA (K-DUR,KLOR-CON) 20 MEQ tablet Take 20 mEq by mouth daily as needed. Take only when take Torsemide      . pravastatin (PRAVACHOL) 40 MG tablet Take 1 tablet (40 mg total) by mouth at bedtime.  90 tablet  3  . sitaGLIPtin (JANUVIA) 100 MG tablet Take 1 tablet (100 mg total) by mouth daily.  90 tablet  1  . torsemide (DEMADEX) 20 MG tablet Take 1 tablet (20 mg total) by mouth every other day.  45 tablet  3  . traMADol (ULTRAM) 50 MG tablet Take 1 tablet (50 mg total) by mouth every 6 (six) hours as needed. For pain  180 tablet  1   No current  facility-administered medications for this encounter.    Filed Vitals:   05/04/14 1357  BP: 114/58  Pulse: 79  Weight: 268 lb 12.8 oz (121.927 kg)  SpO2: 95%   PHYSICAL EXAM: General: Obese, Well appearing. No resp difficulty, mother present HEENT: normal  Neck: supple. JVP 8 cm. Carotids 2+ bilat; no bruits. No lymphadenopathy or thryomegaly appreciated.  Cor: PMI nondisplaced. Regular rate & rhythm. Split S2. No S3. I am unable to palpate her pedal pulses.  Lungs: clear  Abdomen: obese, soft, nontender, nondistended. No hepatosplenomegaly. No bruits or masses. Good bowel sounds.  Extremities: no cyanosis, clubbing, rash.  1+ edema 1/2 up lower legs bilaterally.  Neuro: alert & orientedx3, cranial nerves grossly intact. moves all 4 extremities w/o difficulty. Affect pleasant  ASSESSMENT & PLAN:  1) Chronic systolic HF: Ischemic cardiomyopathy with EF 30% on today's echo.  Low EF has been persistent.  She has a wide QRS complex with LBBB.  NYHA class II-III symptoms.  She does have some volume overload on exam and weight is up.  - Start torsemide 20 mg every other day.  BMET in 2 wks.  - Continue current doses of Coreg and hydralazine/Imdur.   - Refer to EP for CRT-D.   2) CAD: s/p complex PTCA and stenting of LAD 07/2012 - Continue statin, ASA and plavix 3) Hyperlipidemia: LDL not elevated, TGs quite high in 6/15.  4) PAD:  She says that her legs feel very weak with ambulation.  Pulses are difficult to palpate.  I will get peripheral arterial dopplers to assess.    [name] 05/05/2014

## 2014-05-05 NOTE — Addendum Note (Signed)
Encounter addended by: Vanessa Barbara, CCT on: 05/05/2014  9:11 AM<BR>     Documentation filed: Charges VN

## 2014-05-06 ENCOUNTER — Encounter (HOSPITAL_COMMUNITY): Payer: Medicare Other

## 2014-05-09 ENCOUNTER — Other Ambulatory Visit (HOSPITAL_COMMUNITY): Payer: Self-pay | Admitting: Radiology

## 2014-05-09 DIAGNOSIS — R0989 Other specified symptoms and signs involving the circulatory and respiratory systems: Secondary | ICD-10-CM

## 2014-05-10 ENCOUNTER — Ambulatory Visit (HOSPITAL_COMMUNITY): Payer: Medicare Other | Attending: Cardiology | Admitting: Cardiology

## 2014-05-10 DIAGNOSIS — I70219 Atherosclerosis of native arteries of extremities with intermittent claudication, unspecified extremity: Secondary | ICD-10-CM | POA: Diagnosis present

## 2014-05-10 DIAGNOSIS — I10 Essential (primary) hypertension: Secondary | ICD-10-CM | POA: Diagnosis not present

## 2014-05-10 DIAGNOSIS — E1159 Type 2 diabetes mellitus with other circulatory complications: Secondary | ICD-10-CM

## 2014-05-10 DIAGNOSIS — E785 Hyperlipidemia, unspecified: Secondary | ICD-10-CM | POA: Diagnosis not present

## 2014-05-10 DIAGNOSIS — E1169 Type 2 diabetes mellitus with other specified complication: Secondary | ICD-10-CM

## 2014-05-10 DIAGNOSIS — R0989 Other specified symptoms and signs involving the circulatory and respiratory systems: Secondary | ICD-10-CM

## 2014-05-10 DIAGNOSIS — I2510 Atherosclerotic heart disease of native coronary artery without angina pectoris: Secondary | ICD-10-CM

## 2014-05-10 DIAGNOSIS — E669 Obesity, unspecified: Secondary | ICD-10-CM

## 2014-05-10 DIAGNOSIS — I739 Peripheral vascular disease, unspecified: Secondary | ICD-10-CM

## 2014-05-10 NOTE — Progress Notes (Signed)
LEA Doppler/ABI performed 

## 2014-05-17 ENCOUNTER — Telehealth (HOSPITAL_COMMUNITY): Payer: Self-pay | Admitting: *Deleted

## 2014-05-17 NOTE — Telephone Encounter (Signed)
Noted  

## 2014-05-17 NOTE — Telephone Encounter (Signed)
Pt called for test results, given results of normal abi's

## 2014-05-18 ENCOUNTER — Other Ambulatory Visit: Payer: Self-pay | Admitting: Cardiology

## 2014-05-19 LAB — COMPREHENSIVE METABOLIC PANEL
ALBUMIN: 4.1 g/dL (ref 3.6–4.8)
ALT: 13 IU/L (ref 0–32)
AST: 16 IU/L (ref 0–40)
Albumin/Globulin Ratio: 1.5 (ref 1.1–2.5)
Alkaline Phosphatase: 75 IU/L (ref 39–117)
BUN/Creatinine Ratio: 21 (ref 11–26)
BUN: 27 mg/dL (ref 8–27)
CALCIUM: 9.1 mg/dL (ref 8.7–10.3)
CO2: 25 mmol/L (ref 18–29)
CREATININE: 1.27 mg/dL — AB (ref 0.57–1.00)
Chloride: 99 mmol/L (ref 97–108)
GFR calc Af Amer: 53 mL/min/{1.73_m2} — ABNORMAL LOW (ref >59)
GFR calc non Af Amer: 46 mL/min/{1.73_m2} — ABNORMAL LOW (ref >59)
GLOBULIN, TOTAL: 2.7 g/dL (ref 1.5–4.5)
GLUCOSE: 71 mg/dL (ref 65–99)
Potassium: 4.9 mmol/L (ref 3.5–5.2)
Sodium: 139 mmol/L (ref 134–144)
TOTAL PROTEIN: 6.8 g/dL (ref 6.0–8.5)
Total Bilirubin: 0.3 mg/dL (ref 0.0–1.2)

## 2014-05-23 ENCOUNTER — Other Ambulatory Visit: Payer: Self-pay | Admitting: *Deleted

## 2014-05-23 DIAGNOSIS — E1142 Type 2 diabetes mellitus with diabetic polyneuropathy: Secondary | ICD-10-CM

## 2014-05-24 MED ORDER — TRAMADOL HCL 50 MG PO TABS: 50.0000 mg | ORAL_TABLET | Freq: Four times a day (QID) | ORAL | Status: DC | PRN

## 2014-05-24 NOTE — Telephone Encounter (Signed)
Approved refill for Tramadol; please place rx in by box to sign on Wed, 8/19. I will not be in the office on 8/18.  This rx will need to be faxed to mail order pharmacy.

## 2014-05-24 NOTE — Telephone Encounter (Signed)
Original Rx printed out on reg copy paper. Reprinted and put in Dr Thompson Caul box.

## 2014-05-26 NOTE — Telephone Encounter (Signed)
Faxed

## 2014-06-07 ENCOUNTER — Institutional Professional Consult (permissible substitution): Payer: Medicare Other | Admitting: Internal Medicine

## 2014-06-10 ENCOUNTER — Ambulatory Visit (INDEPENDENT_AMBULATORY_CARE_PROVIDER_SITE_OTHER): Payer: Medicare Other | Admitting: Internal Medicine

## 2014-06-10 ENCOUNTER — Encounter: Payer: Self-pay | Admitting: Internal Medicine

## 2014-06-10 ENCOUNTER — Encounter: Payer: Self-pay | Admitting: *Deleted

## 2014-06-10 VITALS — BP 124/62 | HR 78 | Ht 68.0 in | Wt 261.5 lb

## 2014-06-10 DIAGNOSIS — I5021 Acute systolic (congestive) heart failure: Secondary | ICD-10-CM

## 2014-06-10 DIAGNOSIS — I2510 Atherosclerotic heart disease of native coronary artery without angina pectoris: Secondary | ICD-10-CM

## 2014-06-10 DIAGNOSIS — I519 Heart disease, unspecified: Secondary | ICD-10-CM

## 2014-06-10 DIAGNOSIS — I209 Angina pectoris, unspecified: Secondary | ICD-10-CM

## 2014-06-10 DIAGNOSIS — I447 Left bundle-branch block, unspecified: Secondary | ICD-10-CM

## 2014-06-10 DIAGNOSIS — Z01812 Encounter for preprocedural laboratory examination: Secondary | ICD-10-CM

## 2014-06-10 DIAGNOSIS — I25119 Atherosclerotic heart disease of native coronary artery with unspecified angina pectoris: Secondary | ICD-10-CM

## 2014-06-10 NOTE — Progress Notes (Signed)
ELECTROPHYSIOLOGY CONSULT NOTE  Patient ID: [name], MRN: [id number], DOB/AGE: 1956-06-15 58 y.o. Admit date: (Not on file) Date of Consult: 06/10/2014  Primary Physician: [name], MD Primary Cardiologist: CHF  Chief Complaint:  ICD    HPI [name] is a 58 y.o. female  Referred for consideration of CRT  She has ischemic CM with cath10/13> occluded LAD and PDA and underwent percutaneous revascularization with some interval improvement in Ef  But remains persistently in the 30 % range  She has modest shortness of breath and DOE<1 flt of stairs.  2 pillow orthopnea No palpitations or syncope but episodic lightheadedness assoc with prolonged standing  She has chronic diuretic responsive edema  She has been unable to tolerate spironolactone or lisinopril due to hyperkalemia.   ECHO 10/29/12 EF 35-40%  ECHO 03/11/13 EF 30%  ECHO 7/15 EF 30%, inferior akinesis with global hypokinesis, mild MR, normal RV.   MRI 2013  Demonstrated scar in the anterior and septal regions, notably not in the lateral wall  ECG  qRS 190    Past Medical History  Diagnosis Date  . Pure hypercholesterolemia   . Obesity, unspecified   . Unspecified vitamin D deficiency   . Pure hypercholesterolemia   . Type II or unspecified type diabetes mellitus without mention of complication, not stated as uncontrolled   . Neuropathy 2011  . Chicken pox   . Coronary artery disease   . CHF (congestive heart failure) 07/19/2012    EF 15% by cardiac catheterization.  Admission.  . Myocardial infarct 07/19/12    s/p cardiac catheterization with stenting to LAD.  Marland Kitchen Hypertension   . Anemia       Surgical History:  Past Surgical History  Procedure Laterality Date  . Lumbar back surgery  1996    L4-5  . Groin abscess  2007    I & D with ICU stay due to sepsis.  . Gallbladder surgery  2011  . Bilateral oophorectomy  01/2011    ovarian cyst benign  . Admission  07/19/12    new onset  systolic CHF; elevated troponins.  Cardiac catheterization with stenting to LAD; EF 15%.  2D-echo: EF 20-25%.  . Cholecystectomy    . Tubal ligation    . Cardiac catheterization Left   . Total abdominal hysterectomy  01/2011    Fibroids/DUB.  Ovaries removed. Fontaine.     Home Meds: Prior to Admission medications   Medication Sig Start Date End Date Taking? Authorizing Provider  aspirin EC 81 MG tablet Take 1 tablet (81 mg total) by mouth daily. 03/31/14  Yes Wardell Honour, MD  carvedilol (COREG) 25 MG tablet Take 1 tablet (25 mg total) by mouth 2 (two) times daily with a meal. 03/31/14  Yes Wardell Honour, MD  clopidogrel (PLAVIX) 75 MG tablet Take 1 tablet (75 mg total) by mouth daily. 03/31/14  Yes Wardell Honour, MD  gabapentin (NEURONTIN) 600 MG tablet Take 1 tablet (600 mg total) by mouth 3 (three) times daily. 03/31/14  Yes Wardell Honour, MD  glimepiride (AMARYL) 4 MG tablet Take 1 tablet (4 mg total) by mouth 2 (two) times daily before a meal. 03/31/14  Yes Wardell Honour, MD  glucose blood test strip Test blood sugar daily. Dx code: 250.00 03/17/14  Yes Wendie Agreste, MD  hydrALAZINE (APRESOLINE) 50 MG tablet Take 1 tablet (50 mg total) by mouth 3 (three) times daily. 03/31/14  Yes Wardell Honour, MD  isosorbide mononitrate (IMDUR) 60 MG 24 hr tablet Take 1 tablet (60 mg total) by mouth daily. 10/26/13  Yes Rande Brunt, NP  ketoconazole (NIZORAL) 2 % cream Apply 1 application topically 2 (two) times daily. 10/04/13  Yes Wardell Honour, MD  metFORMIN (GLUCOPHAGE) 1000 MG tablet Take 1 tablet (1,000 mg total) by mouth daily with breakfast. 03/31/14  Yes Wardell Honour, MD  nitroGLYCERIN (NITROSTAT) 0.4 MG SL tablet Place 1 tablet (0.4 mg total) under the tongue every 5 (five) minutes as needed for chest pain. 07/26/12  Yes Coral Spikes, DO  nortriptyline (PAMELOR) 50 MG capsule Take 1 capsule (50 mg total) by mouth at bedtime. 03/31/14  Yes Wardell Honour, MD  potassium chloride SA  (K-DUR,KLOR-CON) 20 MEQ tablet Take 20 mEq by mouth daily as needed. Take only when take Torsemide   Yes Historical Provider, MD  pravastatin (PRAVACHOL) 40 MG tablet Take 1 tablet (40 mg total) by mouth at bedtime. 03/31/14  Yes Wardell Honour, MD  sitaGLIPtin (JANUVIA) 100 MG tablet Take 1 tablet (100 mg total) by mouth daily. 03/31/14  Yes Wardell Honour, MD  torsemide (DEMADEX) 20 MG tablet Take 1 tablet (20 mg total) by mouth every other day. 05/04/14  Yes Larey Dresser, MD  traMADol (ULTRAM) 50 MG tablet Take 1 tablet (50 mg total) by mouth every 6 (six) hours as needed. For pain 05/24/14  Yes Wardell Honour, MD      Allergies: No Known Allergies  History   Social History  . Marital Status: Married    Spouse Name: N/A    Number of Children: 2  . Years of Education: 12   Occupational History  . disabled     03/2010 for peripheral neuropathy  . home daycare     x 20 yrs.   Social History Main Topics  . Smoking status: Never Smoker   . Smokeless tobacco: Not on file  . Alcohol Use: No  . Drug Use: No  . Sexual Activity: Yes    Birth Control/ Protection: Post-menopausal, Surgical   Other Topics Concern  . Not on file   Social History Narrative   Marital status:Married x 42 yrs. Happily married, no abuse.       Children:  2 children (daughter 31, son 37). One grandson and 2 step grandchildren. Daughter and grandson live in home with pt and spouse.       Lives: with husband.      Employment: disability for peripheral neuropathy 2012.       Tobacco: none      Alcohol: none      Drugs: none      Exercise: none.  [employer].      Pets: dog.      Always uses seat belts, smoke detectors in home.      No guns in the home.       Caffeine use: 2 cups coffee per day.       Nutrition: Well balanced diet.     Family History  Problem Relation Age of Onset  . Diabetes Mother   . Hypertension Mother   . Arthritis Mother     knees, lumbar DDD, cervical DDD  . Cancer  Father     prostate,skin,lymphoma.  . Obesity Brother   . Diabetes Maternal Grandmother   . Diabetes Paternal Grandmother      ROS:  Please see the history of present illness.     All  other systems reviewed and negative.    Physical Exam: Blood pressure 124/62, pulse 78, height 5\' 8"  (1.727 m), weight 261 lb 8 oz (118.616 kg). General: Well developed, well nourished female in no acute distress. Head: Normocephalic, atraumatic, sclera non-icteric, no xanthomas, nares are without discharge. EENT: normal Lymph Nodes:  none Back: without scoliosis/kyphosis, no CVA tendersness Neck: Negative for carotid bruits. JVD 10 Lungs: Clear bilaterally to auscultation without wheezes, rales, or rhonchi. Breathing is unlabored. Heart: RRR with S1 S2. 2 /6 systolic  murmur , rubs, or gallops appreciated. Abdomen: Soft, non-tender, non-distended with normoactive bowel sounds. No hepatomegaly. No rebound/guarding. No obvious abdominal masses. Msk:  Strength and tone appear normal for age. Extremities: No clubbing or cyanosis. 1-2+ edema.  Distal pedal pulses are 2+ and equal bilaterally. Skin: Warm and Dry Neuro: Alert and oriented X 3. CN III-XII intact Grossly normal sensory and motor function . Psych:  Responds to questions appropriately with a normal affect.      Labs: Cardiac Enzymes No results found for this basename: CKTOTAL, CKMB, TROPONINI,  in the last 72 hours CBC Lab Results  Component Value Date   WBC 7.7 03/30/2014   HGB 11.9* 03/30/2014   HCT 36.0 03/30/2014   MCV 88.9 03/30/2014   PLT 219 03/30/2014   PROTIME: No results found for this basename: LABPROT, INR,  in the last 72 hours Chemistry No results found for this basename: NA, K, CL, CO2, BUN, CREATININE, CALCIUM, LABALBU, PROT, BILITOT, ALKPHOS, ALT, AST, GLUCOSE,  in the last 168 hours Lipids Lab Results  Component Value Date   CHOL 124 03/30/2014   HDL 27* 03/30/2014   LDLCALC 19 03/30/2014   TRIG 390* 03/30/2014    BNP Pro B Natriuretic peptide (BNP)  Date/Time Value Ref Range Status  07/20/2012  8:28 PM 5006.0* 0 - 125 pg/mL Final   Miscellaneous Lab Results  Component Value Date   DDIMER 1.77* 07/20/2012    Radiology/Studies:  No results found.  EKG: NSR  LBBB 196 QRS   Assessment and Plan:   Ischemic cardiomyopathy  Congestive heart failure-chronic-systolic  Left bundle branch block  Sleep-disordered breathing  Morbid obesity  The patient has congestive heart failure in the setting of persistent depression of LV systolic function. It is appropriate to consider her for ICD implantation for primary prevention of sudden death. We reviewed the potential benefits and risks she would like to proceed.  In addition, she has congestive heart failure and a very broad left bundle. Studies have been variable as to the importance of broad QRS particularly in women or so studies is suggested a greater than 170-80 ms is associated with the lack of response. Other studies do not confirm this.  Despite her broad LBBB no significant scar was noted on the lateral wall increasing the likelihood of benefit  I think with her symptoms it is appropriate to consider CRT D. Implantation.  \Have reviewed the potential benefits and risks of ICD implantation including but not limited to death, perforation of heart or lung, lead dislodgement, infection,  device malfunction and inappropriate shocks.  The patient and family express understanding  and are willing to proceed.    She also needs a sleep study. Her primary care physician recommended this previously and she declined. She is willing to proceed along that line now.     Virl Axe

## 2014-06-10 NOTE — Patient Instructions (Addendum)
Your physician has recommended that you have a defibrillator inserted. An implantable cardioverter defibrillator (ICD) is a small device that is placed in your chest or, in rare cases, your abdomen. This device uses electrical pulses or shocks to help control life-threatening, irregular heartbeats that could lead the heart to suddenly stop beating (sudden cardiac arrest). Leads are attached to the ICD that goes into your heart. This is done in the hospital and usually requires an overnight stay. Please see the instruction sheet given to you today for more information.  Your physician has recommended that you have a pacemaker inserted. A pacemaker is a small device that is placed under the skin of your chest or abdomen to help control abnormal heart rhythms. This device uses electrical pulses to prompt the heart to beat at a normal rate. Pacemakers are used to treat heart rhythms that are too slow. Wire (leads) are attached to the pacemaker that goes into the chambers of you heart. This is done in the hospital and usually requires and overnight stay. Please see the instruction sheet given to you today for more information.  Your device will be a combination of these two. Please follow instructions on attached letter.

## 2014-06-15 ENCOUNTER — Ambulatory Visit (HOSPITAL_COMMUNITY)
Admission: RE | Admit: 2014-06-15 | Discharge: 2014-06-15 | Disposition: A | Discharge status: Home / Self Care | Payer: Medicare Other | Source: Ambulatory Visit | Attending: Cardiology | Admitting: Cardiology

## 2014-06-15 VITALS — BP 108/56 | HR 76 | Wt 263.8 lb

## 2014-06-15 DIAGNOSIS — Z6841 Body Mass Index (BMI) 40.0 and over, adult: Secondary | ICD-10-CM | POA: Insufficient documentation

## 2014-06-15 DIAGNOSIS — I509 Heart failure, unspecified: Secondary | ICD-10-CM | POA: Insufficient documentation

## 2014-06-15 DIAGNOSIS — I5022 Chronic systolic (congestive) heart failure: Secondary | ICD-10-CM | POA: Diagnosis present

## 2014-06-15 DIAGNOSIS — R0609 Other forms of dyspnea: Secondary | ICD-10-CM | POA: Insufficient documentation

## 2014-06-15 DIAGNOSIS — I2510 Atherosclerotic heart disease of native coronary artery without angina pectoris: Secondary | ICD-10-CM | POA: Insufficient documentation

## 2014-06-15 DIAGNOSIS — Z9861 Coronary angioplasty status: Secondary | ICD-10-CM | POA: Diagnosis not present

## 2014-06-15 DIAGNOSIS — E119 Type 2 diabetes mellitus without complications: Secondary | ICD-10-CM | POA: Diagnosis not present

## 2014-06-15 DIAGNOSIS — E785 Hyperlipidemia, unspecified: Secondary | ICD-10-CM | POA: Diagnosis not present

## 2014-06-15 DIAGNOSIS — R0602 Shortness of breath: Secondary | ICD-10-CM | POA: Diagnosis not present

## 2014-06-15 DIAGNOSIS — I739 Peripheral vascular disease, unspecified: Secondary | ICD-10-CM | POA: Diagnosis not present

## 2014-06-15 DIAGNOSIS — G8929 Other chronic pain: Secondary | ICD-10-CM | POA: Diagnosis not present

## 2014-06-15 DIAGNOSIS — R0989 Other specified symptoms and signs involving the circulatory and respiratory systems: Secondary | ICD-10-CM | POA: Insufficient documentation

## 2014-06-15 DIAGNOSIS — R0683 Snoring: Secondary | ICD-10-CM

## 2014-06-15 NOTE — Patient Instructions (Signed)
Your physician has recommended that you have a sleep study. This test records several body functions during sleep, including: brain activity, eye movement, oxygen and carbon dioxide blood levels, heart rate and rhythm, breathing rate and rhythm, the flow of air through your mouth and nose, snoring, body muscle movements, and chest and belly movement.  Your physician recommends that you schedule a follow-up appointment in: 2 months

## 2014-06-16 NOTE — Progress Notes (Signed)
Patient ID: [name], female   DOB: 1956-02-18, 58 y.o.   MRN: [id number]   Weight Range   275 pounds  Baseline proBNP   5000.6    HPI: [name] is a 58 y.o. female with past medical history of HL, morbid obesity, chronic pain and DM since she was 58 years old. She had neuropathy in 2011 causing her to be in a wheelchair, this recovered after 8 months of therapy.   Admitted to [facility] 10/13 with progressive dyspnea and lower extremity edema.  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  Underwent complex PTCA and stenting of LAD by Dr. Burt Knack.  She was placed on Brilinta and ASA for 1 year, now on Plavix and ASA.    She has been unable to tolerate spironolactone or lisinopril due to hyperkalemia.  ECHO 10/29/12 EF 35-40%  ECHO 03/11/13 EF 30% ECHO 7/15 EF 30%, inferior akinesis with global hypokinesis, mild MR, normal RV.   Peripheral arterial dopplers 8/15 with normal ABIs bilaterally  Follow up:  Stable since last appointment.  She has been taking torsemide every other day.  She can walk around her house without problems.  She is short of breath walking 1/2 block or up stairs.  No orthopnea or PND.  No chest pain.  Weight is down 5 lbs since last appointment. She has generalized fatigue, falls asleep easily, and snores loudly.    Labs (6/15): LDL 19, TGs 390 Labs (8/15): K 4.9, creatinine 1.27   ROS: All systems negative except as listed in HPI, PMH and Problem List.  Past Medical History  Diagnosis Date  . Pure hypercholesterolemia   . Obesity, unspecified   . Unspecified vitamin D deficiency   . Pure hypercholesterolemia   . Type II or unspecified type diabetes mellitus without mention of complication, not stated as uncontrolled   . Neuropathy 2011  . Chicken pox   . Coronary artery disease   . CHF (congestive heart failure) 07/19/2012    EF 15% by cardiac catheterization.  Admission.  . Myocardial  infarct 07/19/12    s/p cardiac catheterization with stenting to LAD.  Marland Kitchen Hypertension   . Anemia     Current Outpatient Prescriptions  Medication Sig Dispense Refill  . aspirin EC 81 MG tablet Take 1 tablet (81 mg total) by mouth daily.  90 tablet  3  . carvedilol (COREG) 25 MG tablet Take 1 tablet (25 mg total) by mouth 2 (two) times daily with a meal.  180 tablet  3  . clopidogrel (PLAVIX) 75 MG tablet Take 1 tablet (75 mg total) by mouth daily.  90 tablet  3  . gabapentin (NEURONTIN) 600 MG tablet Take 1 tablet (600 mg total) by mouth 3 (three) times daily.  270 tablet  3  . glimepiride (AMARYL) 4 MG tablet Take 1 tablet (4 mg total) by mouth 2 (two) times daily before a meal.  180 tablet  3  . glucose blood test strip Test blood sugar daily. Dx code: 250.00  100 each  4  . hydrALAZINE (APRESOLINE) 50 MG tablet Take 1 tablet (50 mg total) by mouth 3 (three) times daily.  270 tablet  3  . isosorbide mononitrate (IMDUR) 60 MG 24 hr tablet Take 1 tablet (60 mg total) by mouth daily.  90 tablet  3  . ketoconazole (NIZORAL) 2 % cream Apply 1 application topically 2 (two) times daily.  [name]  g  0  . metFORMIN (GLUCOPHAGE) 1000 MG tablet Take 1 tablet (1,000 mg total) by mouth daily with breakfast.  90 tablet  3  . nitroGLYCERIN (NITROSTAT) 0.4 MG SL tablet Place 1 tablet (0.4 mg total) under the tongue every 5 (five) minutes as needed for chest pain.  30 tablet  0  . nortriptyline (PAMELOR) 50 MG capsule Take 1 capsule (50 mg total) by mouth at bedtime.  90 capsule  3  . potassium chloride SA (K-DUR,KLOR-CON) 20 MEQ tablet Take 20 mEq by mouth daily as needed. Take only when take Torsemide      . pravastatin (PRAVACHOL) 40 MG tablet Take 1 tablet (40 mg total) by mouth at bedtime.  90 tablet  3  . sitaGLIPtin (JANUVIA) 100 MG tablet Take 1 tablet (100 mg total) by mouth daily.  90 tablet  1  . torsemide (DEMADEX) 20 MG tablet Take 1 tablet (20 mg total) by mouth every other day.  45 tablet  3  .  traMADol (ULTRAM) 50 MG tablet Take 1 tablet (50 mg total) by mouth every 6 (six) hours as needed. For pain  180 tablet  2   No current facility-administered medications for this encounter.    Filed Vitals:   06/15/14 1123  BP: 108/56  Pulse: 76  Weight: 263 lb 12 oz (119.636 kg)  SpO2: 96%   PHYSICAL EXAM: General: Obese, Well appearing. No resp difficulty, mother present HEENT: normal  Neck: supple. JVP 7 cm. Carotids 2+ bilat; no bruits. No lymphadenopathy or thryomegaly appreciated.  Cor: PMI nondisplaced. Regular rate & rhythm. Split S2. No S3. I am unable to palpate her pedal pulses.  Lungs: clear  Abdomen: obese, soft, nontender, nondistended. No hepatosplenomegaly. No bruits or masses. Good bowel sounds.  Extremities: no cyanosis, clubbing, rash.  1+ edema 1/2 up lower legs bilaterally L>R.  Neuro: alert & orientedx3, cranial nerves grossly intact. moves all 4 extremities w/o difficulty. Affect pleasant  ASSESSMENT & PLAN:  1) Chronic systolic HF: Ischemic cardiomyopathy with EF 30% on 7/15 echo.  Low EF has been persistent.  She has a wide QRS complex with LBBB.  NYHA class II-III symptoms.  She does not look volume overloaded on exam today.  She will have CRT-D implantation by Dr. Caryl Comes later this month.   - Continue torsemide 20 mg every other day.  BMET in 2 wks.  - Continue current doses of Coreg and hydralazine/Imdur.   - She has not been on ACEI or spironolactone due to elevated K and creatinine.    2) CAD: s/p complex PTCA and stenting of LAD 07/2012 - Continue statin, ASA and plavix 3) Hyperlipidemia: LDL not elevated, TGs quite high in 6/15.  4) PAD:  Difficult to palpate pedal pulses but ABIs were normal in 8/15.  5) OSA: Patient has OSA symptoms, will arrange sleep study.    [name] 06/16/2014

## 2014-06-28 ENCOUNTER — Other Ambulatory Visit: Payer: Self-pay

## 2014-06-28 DIAGNOSIS — Z01812 Encounter for preprocedural laboratory examination: Secondary | ICD-10-CM

## 2014-07-01 ENCOUNTER — Encounter (HOSPITAL_COMMUNITY): Payer: Self-pay | Admitting: Pharmacy Technician

## 2014-07-05 DIAGNOSIS — I10 Essential (primary) hypertension: Secondary | ICD-10-CM | POA: Diagnosis not present

## 2014-07-05 DIAGNOSIS — I2589 Other forms of chronic ischemic heart disease: Secondary | ICD-10-CM | POA: Diagnosis not present

## 2014-07-05 DIAGNOSIS — Z79899 Other long term (current) drug therapy: Secondary | ICD-10-CM | POA: Diagnosis not present

## 2014-07-05 DIAGNOSIS — E7800 Pure hypercholesterolemia, unspecified: Secondary | ICD-10-CM | POA: Diagnosis not present

## 2014-07-05 DIAGNOSIS — I447 Left bundle-branch block, unspecified: Secondary | ICD-10-CM | POA: Insufficient documentation

## 2014-07-05 DIAGNOSIS — I5022 Chronic systolic (congestive) heart failure: Secondary | ICD-10-CM | POA: Diagnosis not present

## 2014-07-05 DIAGNOSIS — I2510 Atherosclerotic heart disease of native coronary artery without angina pectoris: Secondary | ICD-10-CM | POA: Insufficient documentation

## 2014-07-05 DIAGNOSIS — Z9861 Coronary angioplasty status: Secondary | ICD-10-CM | POA: Diagnosis not present

## 2014-07-05 DIAGNOSIS — Z7982 Long term (current) use of aspirin: Secondary | ICD-10-CM | POA: Diagnosis not present

## 2014-07-05 DIAGNOSIS — I509 Heart failure, unspecified: Secondary | ICD-10-CM | POA: Diagnosis present

## 2014-07-05 DIAGNOSIS — Z6841 Body Mass Index (BMI) 40.0 and over, adult: Secondary | ICD-10-CM | POA: Diagnosis not present

## 2014-07-05 DIAGNOSIS — E119 Type 2 diabetes mellitus without complications: Secondary | ICD-10-CM | POA: Insufficient documentation

## 2014-07-05 DIAGNOSIS — Z955 Presence of coronary angioplasty implant and graft: Secondary | ICD-10-CM | POA: Insufficient documentation

## 2014-07-05 DIAGNOSIS — I255 Ischemic cardiomyopathy: Secondary | ICD-10-CM | POA: Insufficient documentation

## 2014-07-05 DIAGNOSIS — Z7902 Long term (current) use of antithrombotics/antiplatelets: Secondary | ICD-10-CM | POA: Diagnosis not present

## 2014-07-05 MED ORDER — CEFAZOLIN SODIUM-DEXTROSE 2-3 GM-% IV SOLR: 2.0000 g | INTRAVENOUS | Status: DC

## 2014-07-05 MED ORDER — SODIUM CHLORIDE 0.9 % IV SOLN: INTRAVENOUS | Status: DC

## 2014-07-05 MED ORDER — SODIUM CHLORIDE 0.9 % IR SOLN
80.0000 mg | Status: DC
  Filled 2014-07-05: qty 2

## 2014-07-05 MED ORDER — SODIUM CHLORIDE 0.9 % IV SOLN
INTRAVENOUS | Status: DC
  Administered 2014-07-06 (×2): via INTRAVENOUS

## 2014-07-06 ENCOUNTER — Encounter (HOSPITAL_COMMUNITY)
Admission: RE | Disposition: A | Discharge status: Home / Self Care | Payer: Self-pay | Source: Ambulatory Visit | Attending: Internal Medicine

## 2014-07-06 ENCOUNTER — Ambulatory Visit (HOSPITAL_COMMUNITY)
Admission: RE | Admit: 2014-07-06 | Discharge: 2014-07-07 | Disposition: A | Discharge status: Home / Self Care | Payer: Medicare Other | Source: Ambulatory Visit | Attending: Internal Medicine | Admitting: Internal Medicine

## 2014-07-06 ENCOUNTER — Encounter (HOSPITAL_COMMUNITY): Payer: Self-pay | Admitting: General Practice

## 2014-07-06 DIAGNOSIS — I447 Left bundle-branch block, unspecified: Secondary | ICD-10-CM

## 2014-07-06 DIAGNOSIS — E669 Obesity, unspecified: Secondary | ICD-10-CM

## 2014-07-06 DIAGNOSIS — I5022 Chronic systolic (congestive) heart failure: Secondary | ICD-10-CM | POA: Diagnosis not present

## 2014-07-06 DIAGNOSIS — I5021 Acute systolic (congestive) heart failure: Secondary | ICD-10-CM

## 2014-07-06 DIAGNOSIS — I509 Heart failure, unspecified: Secondary | ICD-10-CM

## 2014-07-06 DIAGNOSIS — I2589 Other forms of chronic ischemic heart disease: Secondary | ICD-10-CM

## 2014-07-06 DIAGNOSIS — I25119 Atherosclerotic heart disease of native coronary artery with unspecified angina pectoris: Secondary | ICD-10-CM

## 2014-07-06 DIAGNOSIS — Z01812 Encounter for preprocedural laboratory examination: Secondary | ICD-10-CM

## 2014-07-06 DIAGNOSIS — I519 Heart disease, unspecified: Secondary | ICD-10-CM

## 2014-07-06 DIAGNOSIS — E1169 Type 2 diabetes mellitus with other specified complication: Secondary | ICD-10-CM

## 2014-07-06 HISTORY — DX: Presence of automatic (implantable) cardiac defibrillator: Z95.810

## 2014-07-06 HISTORY — DX: Personal history of other medical treatment: Z92.89

## 2014-07-06 HISTORY — PX: BI-VENTRICULAR IMPLANTABLE CARDIOVERTER DEFIBRILLATOR  (CRT-D): SHX5747

## 2014-07-06 HISTORY — PX: BI-VENTRICULAR IMPLANTABLE CARDIOVERTER DEFIBRILLATOR: SHX5459

## 2014-07-06 HISTORY — DX: Type 2 diabetes mellitus without complications: E11.9

## 2014-07-06 LAB — GLUCOSE, CAPILLARY
Glucose-Capillary: 160 mg/dL — ABNORMAL HIGH (ref 70–99)
Glucose-Capillary: 114 mg/dL — ABNORMAL HIGH (ref 70–99)
Glucose-Capillary: 125 mg/dL — ABNORMAL HIGH (ref 70–99)
Glucose-Capillary: 180 mg/dL — ABNORMAL HIGH (ref 70–99)

## 2014-07-06 LAB — SURGICAL PCR SCREEN
MRSA, PCR: NEGATIVE
Staphylococcus aureus: POSITIVE — AB

## 2014-07-06 SURGERY — BI-VENTRICULAR IMPLANTABLE CARDIOVERTER DEFIBRILLATOR  (CRT-D)
Anesthesia: LOCAL

## 2014-07-06 MED ORDER — CEFAZOLIN SODIUM-DEXTROSE 2-3 GM-% IV SOLR
INTRAVENOUS | Status: AC
  Filled 2014-07-06: qty 50

## 2014-07-06 MED ORDER — FENTANYL CITRATE 0.05 MG/ML IJ SOLN
INTRAMUSCULAR | Status: AC
  Filled 2014-07-06: qty 2

## 2014-07-06 MED ORDER — MIDAZOLAM HCL 5 MG/5ML IJ SOLN
INTRAMUSCULAR | Status: AC
  Filled 2014-07-06: qty 5

## 2014-07-06 MED ORDER — ASPIRIN EC 81 MG PO TBEC
81.0000 mg | DELAYED_RELEASE_TABLET | Freq: Every day | ORAL | Status: DC
  Administered 2014-07-07: 81 mg via ORAL
  Filled 2014-07-06 (×2): qty 1

## 2014-07-06 MED ORDER — SIMVASTATIN 5 MG PO TABS
5.0000 mg | ORAL_TABLET | Freq: Every day | ORAL | Status: DC
  Administered 2014-07-06: 5 mg via ORAL
  Filled 2014-07-06 (×2): qty 1

## 2014-07-06 MED ORDER — POTASSIUM CHLORIDE CRYS ER 20 MEQ PO TBCR
20.0000 meq | EXTENDED_RELEASE_TABLET | ORAL | Status: DC
  Administered 2014-07-07: 20 meq via ORAL
  Filled 2014-07-06: qty 1

## 2014-07-06 MED ORDER — INFLUENZA VAC SPLIT QUAD 0.5 ML IM SUSY
0.5000 mL | PREFILLED_SYRINGE | INTRAMUSCULAR | Status: AC
  Administered 2014-07-07: 0.5 mL via INTRAMUSCULAR
  Filled 2014-07-06: qty 0.5

## 2014-07-06 MED ORDER — MUPIROCIN 2 % EX OINT
1.0000 "application " | TOPICAL_OINTMENT | Freq: Once | CUTANEOUS | Status: AC
  Administered 2014-07-06: 1 via TOPICAL
  Filled 2014-07-06: qty 22

## 2014-07-06 MED ORDER — HYDRALAZINE HCL 50 MG PO TABS
50.0000 mg | ORAL_TABLET | Freq: Three times a day (TID) | ORAL | Status: DC
  Administered 2014-07-06 – 2014-07-07 (×3): 50 mg via ORAL
  Filled 2014-07-06 (×5): qty 1

## 2014-07-06 MED ORDER — GLIMEPIRIDE 4 MG PO TABS
4.0000 mg | ORAL_TABLET | Freq: Two times a day (BID) | ORAL | Status: DC
  Administered 2014-07-07: 4 mg via ORAL
  Filled 2014-07-06 (×4): qty 1

## 2014-07-06 MED ORDER — SODIUM CHLORIDE 0.9 % IV SOLN
INTRAVENOUS | Status: AC
  Administered 2014-07-06: 12:00:00 via INTRAVENOUS

## 2014-07-06 MED ORDER — ISOSORBIDE MONONITRATE ER 60 MG PO TB24
60.0000 mg | ORAL_TABLET | Freq: Every day | ORAL | Status: DC
  Administered 2014-07-07: 60 mg via ORAL
  Filled 2014-07-06 (×2): qty 1

## 2014-07-06 MED ORDER — CARVEDILOL 25 MG PO TABS
25.0000 mg | ORAL_TABLET | Freq: Two times a day (BID) | ORAL | Status: DC
  Administered 2014-07-06 – 2014-07-07 (×2): 25 mg via ORAL
  Filled 2014-07-06 (×4): qty 1

## 2014-07-06 MED ORDER — NITROGLYCERIN 0.4 MG SL SUBL: 0.4000 mg | SUBLINGUAL_TABLET | SUBLINGUAL | Status: DC | PRN

## 2014-07-06 MED ORDER — NORTRIPTYLINE HCL 25 MG PO CAPS
50.0000 mg | ORAL_CAPSULE | Freq: Every day | ORAL | Status: DC
  Administered 2014-07-06: 50 mg via ORAL
  Filled 2014-07-06 (×2): qty 2

## 2014-07-06 MED ORDER — LINAGLIPTIN 5 MG PO TABS
5.0000 mg | ORAL_TABLET | Freq: Every day | ORAL | Status: DC
  Administered 2014-07-06 – 2014-07-07 (×2): 5 mg via ORAL
  Filled 2014-07-06 (×2): qty 1

## 2014-07-06 MED ORDER — GABAPENTIN 600 MG PO TABS
600.0000 mg | ORAL_TABLET | Freq: Three times a day (TID) | ORAL | Status: DC
  Administered 2014-07-06 – 2014-07-07 (×3): 600 mg via ORAL
  Filled 2014-07-06 (×5): qty 1

## 2014-07-06 MED ORDER — ACETAMINOPHEN 325 MG PO TABS
325.0000 mg | ORAL_TABLET | ORAL | Status: DC | PRN
  Administered 2014-07-06: 650 mg via ORAL
  Filled 2014-07-06: qty 2

## 2014-07-06 MED ORDER — ONDANSETRON HCL 4 MG/2ML IJ SOLN: 4.0000 mg | Freq: Four times a day (QID) | INTRAMUSCULAR | Status: DC | PRN

## 2014-07-06 MED ORDER — MUPIROCIN 2 % EX OINT
TOPICAL_OINTMENT | CUTANEOUS | Status: AC
Start: 2014-07-06 — End: 2014-07-06
  Filled 2014-07-06: qty 22

## 2014-07-06 MED ORDER — LIDOCAINE HCL (PF) 1 % IJ SOLN
INTRAMUSCULAR | Status: AC
  Filled 2014-07-06: qty 60

## 2014-07-06 MED ORDER — TORSEMIDE 20 MG PO TABS
20.0000 mg | ORAL_TABLET | ORAL | Status: DC
  Administered 2014-07-07: 20 mg via ORAL
  Filled 2014-07-06: qty 1

## 2014-07-06 MED ORDER — CLOPIDOGREL BISULFATE 75 MG PO TABS
75.0000 mg | ORAL_TABLET | Freq: Every day | ORAL | Status: DC
  Administered 2014-07-07: 75 mg via ORAL
  Filled 2014-07-06: qty 1

## 2014-07-06 MED ORDER — CEFAZOLIN SODIUM 1-5 GM-% IV SOLN
1.0000 g | Freq: Four times a day (QID) | INTRAVENOUS | Status: AC
  Administered 2014-07-06 – 2014-07-07 (×3): 1 g via INTRAVENOUS
  Filled 2014-07-06 (×3): qty 50

## 2014-07-06 NOTE — H&P (View-Only) (Signed)
ELECTROPHYSIOLOGY CONSULT NOTE  Patient ID: [name], MRN: [id number], DOB/AGE: [birth year] 58 y.o. Admit date: (Not on file) Date of Consult: 06/10/2014  Primary Physician: [name], MD Primary Cardiologist: CHF  Chief Complaint:  ICD    HPI [name] is a 58 y.o. female  Referred for consideration of CRT  She has ischemic CM with cath10/13> occluded LAD and PDA and underwent percutaneous revascularization with some interval improvement in Ef  But remains persistently in the 30 % range  She has modest shortness of breath and DOE<1 flt of stairs.  2 pillow orthopnea No palpitations or syncope but episodic lightheadedness assoc with prolonged standing  She has chronic diuretic responsive edema  She has been unable to tolerate spironolactone or lisinopril due to hyperkalemia.   ECHO 10/29/12 EF 35-40%  ECHO 03/11/13 EF 30%  ECHO 7/15 EF 30%, inferior akinesis with global hypokinesis, mild MR, normal RV.   MRI 2013  Demonstrated scar in the anterior and septal regions, notably not in the lateral wall  ECG  qRS 190    Past Medical History  Diagnosis Date  . Pure hypercholesterolemia   . Obesity, unspecified   . Unspecified vitamin D deficiency   . Pure hypercholesterolemia   . Type II or unspecified type diabetes mellitus without mention of complication, not stated as uncontrolled   . Neuropathy 2011  . Chicken pox   . Coronary artery disease   . CHF (congestive heart failure) 07/19/2012    EF 15% by cardiac catheterization.  Admission.  . Myocardial infarct 07/19/12    s/p cardiac catheterization with stenting to LAD.  Marland Kitchen Hypertension   . Anemia       Surgical History:  Past Surgical History  Procedure Laterality Date  . Lumbar back surgery  1996    L4-5  . Groin abscess  2007    I & D with ICU stay due to sepsis.  . Gallbladder surgery  2011  . Bilateral oophorectomy  01/2011    ovarian cyst benign  . Admission  07/19/12    new onset  systolic CHF; elevated troponins.  Cardiac catheterization with stenting to LAD; EF 15%.  2D-echo: EF 20-25%.  . Cholecystectomy    . Tubal ligation    . Cardiac catheterization Left   . Total abdominal hysterectomy  01/2011    Fibroids/DUB.  Ovaries removed. Fontaine.     Home Meds: Prior to Admission medications   Medication Sig Start Date End Date Taking? Authorizing Provider  aspirin EC 81 MG tablet Take 1 tablet (81 mg total) by mouth daily. 03/31/14  Yes Wardell Honour, MD  carvedilol (COREG) 25 MG tablet Take 1 tablet (25 mg total) by mouth 2 (two) times daily with a meal. 03/31/14  Yes Wardell Honour, MD  clopidogrel (PLAVIX) 75 MG tablet Take 1 tablet (75 mg total) by mouth daily. 03/31/14  Yes Wardell Honour, MD  gabapentin (NEURONTIN) 600 MG tablet Take 1 tablet (600 mg total) by mouth 3 (three) times daily. 03/31/14  Yes Wardell Honour, MD  glimepiride (AMARYL) 4 MG tablet Take 1 tablet (4 mg total) by mouth 2 (two) times daily before a meal. 03/31/14  Yes Wardell Honour, MD  glucose blood test strip Test blood sugar daily. Dx code: 250.00 03/17/14  Yes Wendie Agreste, MD  hydrALAZINE (APRESOLINE) 50 MG tablet Take 1 tablet (50 mg total) by mouth 3 (three) times daily. 03/31/14  Yes Wardell Honour, MD  isosorbide mononitrate (IMDUR) 60 MG 24 hr tablet Take 1 tablet (60 mg total) by mouth daily. 10/26/13  Yes Rande Brunt, NP  ketoconazole (NIZORAL) 2 % cream Apply 1 application topically 2 (two) times daily. 10/04/13  Yes Wardell Honour, MD  metFORMIN (GLUCOPHAGE) 1000 MG tablet Take 1 tablet (1,000 mg total) by mouth daily with breakfast. 03/31/14  Yes Wardell Honour, MD  nitroGLYCERIN (NITROSTAT) 0.4 MG SL tablet Place 1 tablet (0.4 mg total) under the tongue every 5 (five) minutes as needed for chest pain. 07/26/12  Yes Coral Spikes, DO  nortriptyline (PAMELOR) 50 MG capsule Take 1 capsule (50 mg total) by mouth at bedtime. 03/31/14  Yes Wardell Honour, MD  potassium chloride SA  (K-DUR,KLOR-CON) 20 MEQ tablet Take 20 mEq by mouth daily as needed. Take only when take Torsemide   Yes Historical Provider, MD  pravastatin (PRAVACHOL) 40 MG tablet Take 1 tablet (40 mg total) by mouth at bedtime. 03/31/14  Yes Wardell Honour, MD  sitaGLIPtin (JANUVIA) 100 MG tablet Take 1 tablet (100 mg total) by mouth daily. 03/31/14  Yes Wardell Honour, MD  torsemide (DEMADEX) 20 MG tablet Take 1 tablet (20 mg total) by mouth every other day. 05/04/14  Yes Larey Dresser, MD  traMADol (ULTRAM) 50 MG tablet Take 1 tablet (50 mg total) by mouth every 6 (six) hours as needed. For pain 05/24/14  Yes Wardell Honour, MD      Allergies: No Known Allergies  History   Social History  . Marital Status: Married    Spouse Name: N/A    Number of Children: 2  . Years of Education: 12   Occupational History  . disabled     03/2010 for peripheral neuropathy  . home daycare     x 20 yrs.   Social History Main Topics  . Smoking status: Never Smoker   . Smokeless tobacco: Not on file  . Alcohol Use: No  . Drug Use: No  . Sexual Activity: Yes    Birth Control/ Protection: Post-menopausal, Surgical   Other Topics Concern  . Not on file   Social History Narrative   Marital status:Married x 42 yrs. Happily married, no abuse.       Children:  2 children (daughter 64, son 89). One grandson and 2 step grandchildren. Daughter and grandson live in home with pt and spouse.       Lives: with husband.      Employment: disability for peripheral neuropathy 2012.       Tobacco: none      Alcohol: none      Drugs: none      Exercise: none.  [employer].      Pets: dog.      Always uses seat belts, smoke detectors in home.      No guns in the home.       Caffeine use: 2 cups coffee per day.       Nutrition: Well balanced diet.     Family History  Problem Relation Age of Onset  . Diabetes Mother   . Hypertension Mother   . Arthritis Mother     knees, lumbar DDD, cervical DDD  . Cancer  Father     prostate,skin,lymphoma.  . Obesity Brother   . Diabetes Maternal Grandmother   . Diabetes Paternal Grandmother      ROS:  Please see the history of present illness.     All  other systems reviewed and negative.    Physical Exam: Blood pressure 124/62, pulse 78, height 5\' 8"  (1.727 m), weight 261 lb 8 oz (118.616 kg). General: Well developed, well nourished female in no acute distress. Head: Normocephalic, atraumatic, sclera non-icteric, no xanthomas, nares are without discharge. EENT: normal Lymph Nodes:  none Back: without scoliosis/kyphosis, no CVA tendersness Neck: Negative for carotid bruits. JVD 10 Lungs: Clear bilaterally to auscultation without wheezes, rales, or rhonchi. Breathing is unlabored. Heart: RRR with S1 S2. 2 /6 systolic  murmur , rubs, or gallops appreciated. Abdomen: Soft, non-tender, non-distended with normoactive bowel sounds. No hepatomegaly. No rebound/guarding. No obvious abdominal masses. Msk:  Strength and tone appear normal for age. Extremities: No clubbing or cyanosis. 1-2+ edema.  Distal pedal pulses are 2+ and equal bilaterally. Skin: Warm and Dry Neuro: Alert and oriented X 3. CN III-XII intact Grossly normal sensory and motor function . Psych:  Responds to questions appropriately with a normal affect.      Labs: Cardiac Enzymes No results found for this basename: CKTOTAL, CKMB, TROPONINI,  in the last 72 hours CBC Lab Results  Component Value Date   WBC 7.7 03/30/2014   HGB 11.9* 03/30/2014   HCT 36.0 03/30/2014   MCV 88.9 03/30/2014   PLT 219 03/30/2014   PROTIME: No results found for this basename: LABPROT, INR,  in the last 72 hours Chemistry No results found for this basename: NA, K, CL, CO2, BUN, CREATININE, CALCIUM, LABALBU, PROT, BILITOT, ALKPHOS, ALT, AST, GLUCOSE,  in the last 168 hours Lipids Lab Results  Component Value Date   CHOL 124 03/30/2014   HDL 27* 03/30/2014   LDLCALC 19 03/30/2014   TRIG 390* 03/30/2014    BNP Pro B Natriuretic peptide (BNP)  Date/Time Value Ref Range Status  07/20/2012  8:28 PM 5006.0* 0 - 125 pg/mL Final   Miscellaneous Lab Results  Component Value Date   DDIMER 1.77* 07/20/2012    Radiology/Studies:  No results found.  EKG: NSR  LBBB 196 QRS   Assessment and Plan:   Ischemic cardiomyopathy  Congestive heart failure-chronic-systolic  Left bundle branch block  Sleep-disordered breathing  Morbid obesity  The patient has congestive heart failure in the setting of persistent depression of LV systolic function. It is appropriate to consider her for ICD implantation for primary prevention of sudden death. We reviewed the potential benefits and risks she would like to proceed.  In addition, she has congestive heart failure and a very broad left bundle. Studies have been variable as to the importance of broad QRS particularly in women or so studies is suggested a greater than 170-80 ms is associated with the lack of response. Other studies do not confirm this.  Despite her broad LBBB no significant scar was noted on the lateral wall increasing the likelihood of benefit  I think with her symptoms it is appropriate to consider CRT D. Implantation.  \Have reviewed the potential benefits and risks of ICD implantation including but not limited to death, perforation of heart or lung, lead dislodgement, infection,  device malfunction and inappropriate shocks.  The patient and family express understanding  and are willing to proceed.    She also needs a sleep study. Her primary care physician recommended this previously and she declined. She is willing to proceed along that line now.     Virl Axe

## 2014-07-06 NOTE — Interval H&P Note (Signed)
ICD Criteria  Current LVEF:30% ;Obtained > or = 1 month ago and < or = 3 months ago.  NYHA Functional Classification: Class III  Heart Failure History:  Yes, Duration of heart failure since onset is > 9 months  Non-Ischemic Dilated Cardiomyopathy History:  No.  Atrial Fibrillation/Atrial Flutter:  No.  Ventricular Tachycardia History:  No.  Cardiac Arrest History:  No  History of Syndromes with Risk of Sudden Death:  No.  Previous ICD:  No.  Electrophysiology Study: No.  Prior MI: Yes, Most recent MI timeframe is > 40 days.  PPM: No.  OSA:  No  Patient Life Expectancy of >=1 year: Yes.  Anticoagulation Therapy:  Patient is NOT on anticoagulation therapy.   Beta Blocker Therapy:  Yes.   Ace Inhibitor/ARB Therapy:  No, Reason not on Ace Inhibitor/ARB therapy:  hyperkalemiaHistory and Physical Interval Note:  07/06/2014 7:50 AM  [name]  has presented today for surgery, with the diagnosis of chf, cardiomyopathy  The various methods of treatment have been discussed with the patient and family. After consideration of risks, benefits and other options for treatment, the patient has consented to  Procedure(s): BI-VENTRICULAR IMPLANTABLE CARDIOVERTER DEFIBRILLATOR  (CRT-D) (N/A) as a surgical intervention .  The patient's history has been reviewed, patient examined, no change in status, stable for surgery.  I have reviewed the patient's chart and labs.  Questions were answered to the patient's satisfaction.     [name]

## 2014-07-06 NOTE — CV Procedure (Signed)
Akeera Henne Redmond [id number]  [id number]  Preop Dx: ischemic cardiomyopathy LBBB   Postop Dx same/   Procedure:ICD and CRT  venogram  Cx: None   EBL: Minimal    Dictation number [id number]  Virl Axe, MD 07/06/2014 10:24 AM

## 2014-07-07 ENCOUNTER — Ambulatory Visit (HOSPITAL_COMMUNITY): Payer: Medicare Other

## 2014-07-07 DIAGNOSIS — I447 Left bundle-branch block, unspecified: Secondary | ICD-10-CM | POA: Diagnosis not present

## 2014-07-07 DIAGNOSIS — I5022 Chronic systolic (congestive) heart failure: Secondary | ICD-10-CM | POA: Diagnosis not present

## 2014-07-07 DIAGNOSIS — Z955 Presence of coronary angioplasty implant and graft: Secondary | ICD-10-CM | POA: Diagnosis not present

## 2014-07-07 DIAGNOSIS — E780 Pure hypercholesterolemia: Secondary | ICD-10-CM | POA: Diagnosis not present

## 2014-07-07 DIAGNOSIS — I255 Ischemic cardiomyopathy: Secondary | ICD-10-CM | POA: Diagnosis not present

## 2014-07-07 DIAGNOSIS — I2510 Atherosclerotic heart disease of native coronary artery without angina pectoris: Secondary | ICD-10-CM | POA: Diagnosis not present

## 2014-07-07 DIAGNOSIS — Z6841 Body Mass Index (BMI) 40.0 and over, adult: Secondary | ICD-10-CM | POA: Diagnosis not present

## 2014-07-07 DIAGNOSIS — Z79899 Other long term (current) drug therapy: Secondary | ICD-10-CM | POA: Diagnosis not present

## 2014-07-07 DIAGNOSIS — I509 Heart failure, unspecified: Secondary | ICD-10-CM | POA: Diagnosis present

## 2014-07-07 DIAGNOSIS — I10 Essential (primary) hypertension: Secondary | ICD-10-CM | POA: Diagnosis not present

## 2014-07-07 DIAGNOSIS — Z7902 Long term (current) use of antithrombotics/antiplatelets: Secondary | ICD-10-CM | POA: Diagnosis not present

## 2014-07-07 DIAGNOSIS — E119 Type 2 diabetes mellitus without complications: Secondary | ICD-10-CM | POA: Diagnosis not present

## 2014-07-07 DIAGNOSIS — Z7982 Long term (current) use of aspirin: Secondary | ICD-10-CM | POA: Diagnosis not present

## 2014-07-07 LAB — GLUCOSE, CAPILLARY
GLUCOSE-CAPILLARY: 149 mg/dL — AB (ref 70–99)
Glucose-Capillary: 201 mg/dL — ABNORMAL HIGH (ref 70–99)

## 2014-07-07 NOTE — Discharge Instructions (Signed)
° °  Supplemental Discharge Instructions for  Pacemaker/Defibrillator Patients  Activity No heavy lifting or vigorous activity with your left/right arm for 6 to 8 weeks.  Do not raise your left/right arm above your head for one week.  Gradually raise your affected arm as drawn below.                       10/04                   10/05                    10/06                    10/07            NO DRIVING for  1 week    ; you may begin driving on     [id number]     . WOUND CARE   Keep the wound area clean and dry.  Do not get this area wet for one week. No showers for one week; you may shower on      10/08        .   The tape/steri-strips on your wound will fall off; do not pull them off.  No bandage is needed on the site.  DO  NOT apply any creams, oils, or ointments to the wound area.   If you notice any drainage or discharge from the wound, any swelling or bruising at the site, or you develop a fever > 101? F after you are discharged home, call the office at once.  Special Instructions   You are still able to use cellular telephones; use the ear opposite the side where you have your pacemaker/defibrillator.  Avoid carrying your cellular phone near your device.   When traveling through airports, show security personnel your identification card to avoid being screened in the metal detectors.  Ask the security personnel to use the hand wand.   Avoid arc welding equipment, MRI testing (magnetic resonance imaging), TENS units (transcutaneous nerve stimulators).  Call the office for questions about other devices.   Avoid electrical appliances that are in poor condition or are not properly grounded.   Microwave ovens are safe to be near or to operate.  Additional information for defibrillator patients should your device go off:   If your device goes off ONCE and you feel fine afterward, notify the device clinic nurses.   If your device goes off ONCE and you do not feel well afterward, call 911.   If  your device goes off TWICE, call 911.   If your device goes off THREE times in one day, call 911.  DO NOT DRIVE YOURSELF OR A FAMILY MEMBER WITH A DEFIBRILLATOR TO THE HOSPITAL--CALL 911.

## 2014-07-07 NOTE — Discharge Summary (Signed)
ELECTROPHYSIOLOGY PROCEDURE DISCHARGE SUMMARY    Patient ID: [name],  MRN: [id number], DOB/AGE: 60-17-1957 60 y.o.  Admit date: 07/06/2014 Discharge date: 07/07/2014  Primary Care Physician: [name], MD Primary Cardiologist: Aundra Dubin Electrophysiologist: Caryl Comes  Primary Discharge Diagnosis:  Ischemic cardiomyopathy, congestive heart failure, and LBBB status post CRTD implant this admission  Secondary Discharge Diagnosis:  1.  CAD - cath10/13> occluded LAD and PDA and underwent percutaneous revascularization  2.  Hyperlipidemia 3.  Diabetes 4.  Hypertension   No Known Allergies   Procedures This Admission:  1.  Implantation of a MDT CRTD on 07-06-14 by Dr Caryl Comes.  The patient received a MDT model number Viva ICD with model number 5076 right atrial lead, [id number] right ventricular lead, and 4398 left ventricular lead.  DFT's were deferred at time of implant. There were no immediate post procedure complications. 2.  CXR on 07-07-14 demonstrated no pneumothorax status post device implantation.   Brief HPI: [name] is a 58 y.o. female was referred to electrophysiology in the outpatient setting for consideration of CRTD implantation.  Past medical history includes ischemic cardiomyopathy, congestive heart failure, and LBBB.  The patient has persistent LV dysfunction despite guideline directed therapy.  Risks, benefits, and alternatives to CRTD implantation were reviewed with the patient who wished to proceed.   Hospital Course:  The patient was admitted and underwent implantation of a MDT CRTD with details as outlined above.   She was monitored on telemetry overnight which demonstrated sinus rhythm with ventricular pacing.  Left chest was without hematoma or ecchymosis.  The device was interrogated and found to be functioning normally.  CXR was obtained and demonstrated no pneumothorax status post device implantation.  Wound care, arm mobility, and restrictions were  reviewed with the patient.  Dr Caryl Comes examined the patient and considered them stable for discharge to home.   The patient's discharge medications include a beta blocker (Carvedilol). She has been intolerant to ACE-I due to hyperkalemia.   Discharge Vitals: Blood pressure 128/64, pulse 67, temperature 97.9 F (36.6 C), temperature source Oral, resp. rate 18, height 5\' 8"  (1.727 m), weight 264 lb 9.6 oz (120.022 kg), SpO2 96.00%.   Well developed and nourished in no acute distress HENT normal Neck supple  Carotids brisk and full without bruits Pocket without hematoma Clear Regular rate and rhythm, no murmurs or gallops Abd-soft with active BS  No Clubbing cyanosis edema Skin-warm and dry A & Oriented  Grossly normal sensory and motor function   Labs:   Lab Results  Component Value Date   WBC 7.7 03/30/2014   HGB 11.9* 03/30/2014   HCT 36.0 03/30/2014   MCV 88.9 03/30/2014   PLT 219 03/30/2014   No results found for this basename: NA, K, CL, CO2, BUN, CREATININE, CALCIUM, LABALBU, PROT, BILITOT, ALKPHOS, ALT, AST, GLUCOSE,  in the last 168 hours  Discharge Medications:    Medication List    ASK your doctor about these medications       aspirin EC 81 MG tablet  Take 1 tablet (81 mg total) by mouth daily.     carvedilol 25 MG tablet  Commonly known as:  COREG  Take 1 tablet (25 mg total) by mouth 2 (two) times daily with a meal.     clopidogrel 75 MG tablet  Commonly known as:  PLAVIX  Take 1 tablet (75 mg total) by mouth daily.     gabapentin 600 MG tablet  Commonly known as:  NEURONTIN  Take 1 tablet (600 mg total) by mouth 3 (three) times daily.     glimepiride 4 MG tablet  Commonly known as:  AMARYL  Take 1 tablet (4 mg total) by mouth 2 (two) times daily before a meal.     glucose blood test strip  Test blood sugar daily. Dx code: 250.00     hydrALAZINE 50 MG tablet  Commonly known as:  APRESOLINE  Take 1 tablet (50 mg total) by mouth 3 (three) times daily.       isosorbide mononitrate 60 MG 24 hr tablet  Commonly known as:  IMDUR  Take 1 tablet (60 mg total) by mouth daily.     metFORMIN 1000 MG tablet  Commonly known as:  GLUCOPHAGE  Take 1,000 mg by mouth daily.     nitroGLYCERIN 0.4 MG SL tablet  Commonly known as:  NITROSTAT  Place 1 tablet (0.4 mg total) under the tongue every 5 (five) minutes as needed for chest pain.     nortriptyline 50 MG capsule  Commonly known as:  PAMELOR  Take 1 capsule (50 mg total) by mouth at bedtime.     potassium chloride SA 20 MEQ tablet  Commonly known as:  K-DUR,KLOR-CON  Take 20 mEq by mouth every other day as needed (for low potassium). Take only when taking Torsemide     pravastatin 40 MG tablet  Commonly known as:  PRAVACHOL  Take 1 tablet (40 mg total) by mouth at bedtime.     sitaGLIPtin 100 MG tablet  Commonly known as:  JANUVIA  Take 1 tablet (100 mg total) by mouth daily.     torsemide 20 MG tablet  Commonly known as:  DEMADEX  Take 1 tablet (20 mg total) by mouth every other day.     traMADol 50 MG tablet  Commonly known as:  ULTRAM  Take 1 tablet (50 mg total) by mouth every 6 (six) hours as needed. For pain        Disposition: wound check next week followup with CHF DM 2-3 weeks with bmet     Duration of Discharge Encounter: Greater than 30 minutes including physician time.  Signed,

## 2014-07-07 NOTE — Op Note (Signed)
NAME:  [name], [name] NO.:  [id number]  MEDICAL RECORD NO.:  [id number]  LOCATION:  [id number]                        FACILITY:  [facility]  PHYSICIAN:  Deboraha Sprang, MD, FACCDATE OF BIRTH:  15-Jan-1956  DATE OF PROCEDURE:  07/06/2014 DATE OF DISCHARGE:                              OPERATIVE REPORT   PREOPERATIVE DIAGNOSES:  Ischemic cardiomyopathy with left bundle-branch block and congestive heart failure.  POSTOPERATIVE DIAGNOSES:  Ischemic cardiomyopathy with left bundle- branch block and congestive heart failure.  PROCEDURE:  Dual-chamber defibrillator implantation with left ventricular lead placement and testing of the high-voltage lead.  DESCRIPTION OF PROCEDURE:  Following obtaining informed consent, the patient was brought to the electrophysiology laboratory and placed on the fluoroscopic table in supine position.  After routine prep and drape of the left upper chest, lidocaine was infiltrated in the prepectoral subclavicular region.  Incision was made and carried down to layer of the prepectoral fascia using electrocautery and sharp dissection.  A pocket was formed similarly.  Hemostasis was obtained.  The attention was turned to gain access to the extrathoracic left subclavian vein, which turned out to be quite difficult.  I ended up doing a venogram, demonstrated a much more cephalad course than I had anticipated.  Three separate venipunctures were then accomplished without the aspiration or puncture of the artery.  Sequentially, a 9.5- [place] and 7-French sheaths were placed, through which we passed a Medtronic 6935 single coil DF-4 defibrillator lead, serial [id number], a Medtronic MB2 CS cannulation sheath, and a Medtronic 5076, 52 cm atrial lead, serial [id number].  Under fluoroscopic guidance, the RV lead was manipulated.  The RV apex, where R-waves were all less than about 2.5 mV.  We then moved the tip of the lead up on the septum a little bit,  where the R-wave was 13.1 with a pace impedance of 760 and a threshold of 0.7 at 0.4 milliseconds. Current threshold was 1.0 mA, and there is no diaphragmatic pacing at 10 V and the current of injury was modest.  This lead was secured to the prepectoral fascia.  We then obtained access to the coronary sinus with little difficulty.  A nonocclusive venogram failed to demonstrate anything, occlusive venogram demonstrated a high lateral branch that coursed more posteriorly.  This was targeted.  A Medtronic 4398, 88 cm lead, serial [id number] was manipulated to the terminal ramification of the vein, which was at the junction between the mid and distal third.  Pacing from the second and third pulse, which were in the mid position between base and apex.  The amplitude was 7.8 with a pace impedance of 560 and a threshold of 0.5 at 0.4.  There is no diaphragmatic pacing at 10 V.  A 9.5-French sheath was removed, and we then proceeded to the atrial lead disposition.  The atrial lead was placed in the right atrial appendage.  In its final location, the amplitude was 2.1 with a pace impedance of 660 and a threshold of 2.3 V at 0.5 milliseconds.  This site was chosen because we had many sites with amplitudes of greater than 4 mV and pacing threshold was greater than 3 V.  The lead was secured to the  prepectoral fascia.  The CS delivery system was removed.  The CS lead was secured following fluoroscopic imaging demonstrating stable position.  The leads were then attached to a [place] ICD, serial [id number].  I should note at this juncture that the QLV number was about 0.75. Through the device, bipolar P-wave was 1.8 with a pace impedance of 456, a threshold of 0.75 at 0.4.  The R-wave was 20 with a pace impedance of 494, threshold of 0.5 at 0.4, and the LV impedance of 380 with a threshold of 0.5 at 0.4.  The high-voltage impedance was 67 ohms.  We then copiously irrigated the pocket with  antibiotic-containing saline solution.  A hemostatic suture was placed around the 3 leads.  The leads and the pulse generator were placed in the pocket and secured to the prepectoral fascia.  Surgicel was placed at the cephalad aspect of the pocket.  The wound was then closed in 2 layers in normal fashion.  The wound was washed, dried, and a Dermabond dressing was applied.  Needle counts, sponge counts, and instrument counts were correct at the end of the procedure according to staff.  The patient tolerated the procedure without apparent complication.     Deboraha Sprang, MD, [facility]     SCK/MEDQ  D:  07/06/2014  T:  07/06/2014  Job:  [id number]

## 2014-07-18 ENCOUNTER — Encounter: Payer: Self-pay | Admitting: Family Medicine

## 2014-07-18 ENCOUNTER — Ambulatory Visit (INDEPENDENT_AMBULATORY_CARE_PROVIDER_SITE_OTHER): Payer: Medicare Other | Admitting: *Deleted

## 2014-07-18 ENCOUNTER — Ambulatory Visit (INDEPENDENT_AMBULATORY_CARE_PROVIDER_SITE_OTHER): Payer: Medicare Other | Admitting: Family Medicine

## 2014-07-18 ENCOUNTER — Encounter: Payer: Self-pay | Admitting: Internal Medicine

## 2014-07-18 VITALS — BP 100/44 | HR 79 | Temp 98.1°F | Resp 16 | Ht 67.25 in | Wt 260.8 lb

## 2014-07-18 DIAGNOSIS — Z23 Encounter for immunization: Secondary | ICD-10-CM

## 2014-07-18 DIAGNOSIS — L03032 Cellulitis of left toe: Secondary | ICD-10-CM

## 2014-07-18 DIAGNOSIS — E785 Hyperlipidemia, unspecified: Secondary | ICD-10-CM

## 2014-07-18 DIAGNOSIS — I10 Essential (primary) hypertension: Secondary | ICD-10-CM

## 2014-07-18 DIAGNOSIS — E119 Type 2 diabetes mellitus without complications: Secondary | ICD-10-CM

## 2014-07-18 DIAGNOSIS — I5021 Acute systolic (congestive) heart failure: Secondary | ICD-10-CM

## 2014-07-18 LAB — MDC_IDC_ENUM_SESS_TYPE_INCLINIC
Battery Remaining Longevity: 105 mo
Brady Statistic AP VS Percent: 0.02 %
Brady Statistic AS VP Percent: 98.46 %
HIGH POWER IMPEDANCE MEASURED VALUE: 55 Ohm
HighPow Impedance: 171 Ohm
Lead Channel Pacing Threshold Amplitude: 0.5 V
Lead Channel Pacing Threshold Amplitude: 0.75 V
Lead Channel Pacing Threshold Pulse Width: 0.4 ms
Lead Channel Pacing Threshold Pulse Width: 0.4 ms
Lead Channel Pacing Threshold Pulse Width: 0.4 ms
Lead Channel Sensing Intrinsic Amplitude: 1.25 mV
Lead Channel Sensing Intrinsic Amplitude: 1.25 mV
Lead Channel Sensing Intrinsic Amplitude: 21.75 mV
Lead Channel Setting Pacing Amplitude: 2.5 V
Lead Channel Setting Pacing Amplitude: 3.5 V
Lead Channel Setting Pacing Pulse Width: 0.4 ms
MDC IDC MSMT BATTERY VOLTAGE: 3.05 V
MDC IDC MSMT LEADCHNL RA IMPEDANCE VALUE: 437 Ohm
MDC IDC MSMT LEADCHNL RV IMPEDANCE VALUE: 437 Ohm
MDC IDC MSMT LEADCHNL RV PACING THRESHOLD AMPLITUDE: 1 V
MDC IDC MSMT LEADCHNL RV SENSING INTR AMPL: 21.375 mV
MDC IDC SESS DTM: 20151012113512
MDC IDC SET LEADCHNL LV PACING PULSEWIDTH: 0.4 ms
MDC IDC SET LEADCHNL RV PACING AMPLITUDE: 3.5 V
MDC IDC SET LEADCHNL RV SENSING SENSITIVITY: 0.3 mV
MDC IDC STAT BRADY AP VP PERCENT: 0.19 %
MDC IDC STAT BRADY AS VS PERCENT: 1.34 %
MDC IDC STAT BRADY RA PERCENT PACED: 0.21 %
MDC IDC STAT BRADY RV PERCENT PACED: 0.08 %
Zone Setting Detection Interval: 250 ms
Zone Setting Detection Interval: 300 ms
Zone Setting Detection Interval: 350 ms
Zone Setting Detection Interval: 450 ms

## 2014-07-18 LAB — CBC WITH DIFFERENTIAL/PLATELET
Basophils Absolute: 0 10*3/uL (ref 0.0–0.1)
Basophils Relative: 0 % (ref 0–1)
Eosinophils Absolute: 0 10*3/uL (ref 0.0–0.7)
Eosinophils Relative: 0 % (ref 0–5)
HCT: 31.5 % — ABNORMAL LOW (ref 36.0–46.0)
HEMOGLOBIN: 10.4 g/dL — AB (ref 12.0–15.0)
LYMPHS ABS: 1 10*3/uL (ref 0.7–4.0)
Lymphocytes Relative: 17 % (ref 12–46)
MCH: 27.4 pg (ref 26.0–34.0)
MCHC: 33 g/dL (ref 30.0–36.0)
MCV: 82.9 fL (ref 78.0–100.0)
Monocytes Absolute: 0.4 10*3/uL (ref 0.1–1.0)
Monocytes Relative: 7 % (ref 3–12)
NEUTROS PCT: 76 % (ref 43–77)
Neutro Abs: 4.4 10*3/uL (ref 1.7–7.7)
Platelets: 213 10*3/uL (ref 150–400)
RBC: 3.8 MIL/uL — AB (ref 3.87–5.11)
RDW: 14 % (ref 11.5–15.5)
WBC: 5.8 10*3/uL (ref 4.0–10.5)

## 2014-07-18 LAB — COMPREHENSIVE METABOLIC PANEL
ALBUMIN: 3.6 g/dL (ref 3.5–5.2)
ALT: 8 U/L (ref 0–35)
AST: 14 U/L (ref 0–37)
Alkaline Phosphatase: 90 U/L (ref 39–117)
BUN: 26 mg/dL — AB (ref 6–23)
CO2: 27 meq/L (ref 19–32)
Calcium: 8.8 mg/dL (ref 8.4–10.5)
Chloride: 99 mEq/L (ref 96–112)
Creat: 1.7 mg/dL — ABNORMAL HIGH (ref 0.50–1.10)
Glucose, Bld: 236 mg/dL — ABNORMAL HIGH (ref 70–99)
Potassium: 5.2 mEq/L (ref 3.5–5.3)
SODIUM: 139 meq/L (ref 135–145)
TOTAL PROTEIN: 6.9 g/dL (ref 6.0–8.3)
Total Bilirubin: 0.4 mg/dL (ref 0.2–1.2)

## 2014-07-18 LAB — HEMOGLOBIN A1C
Hgb A1c MFr Bld: 6.4 % — ABNORMAL HIGH (ref <5.7)
MEAN PLASMA GLUCOSE: 137 mg/dL — AB (ref <117)

## 2014-07-18 MED ORDER — DOXYCYCLINE HYCLATE 100 MG PO CAPS: 100.0000 mg | ORAL_CAPSULE | Freq: Two times a day (BID) | ORAL | Status: DC

## 2014-07-18 NOTE — Progress Notes (Signed)
Wound check appointment.  Wound without redness or edema. Incision edges approximated, wound well healed. Normal device function. Thresholds, sensing, and impedances consistent with implant measurements. Device programmed at 3.5V for extra safety margin until 3 month visit. Histogram distribution appropriate for patient and level of activity. No mode switches or ventricular arrhythmias noted. Patient educated about wound care, arm mobility, lifting restrictions, shock plan. ROV in 3 months with implanting physician. 

## 2014-07-18 NOTE — Patient Instructions (Signed)
1. HOLD HYDRALAZINE TODAY.   2. CHECK BLOOD PRESSURE DAILY; CALL HEART FAILURE DOCTOR IF BLOOD PRESSURE REMAINS LOW. 3.  CLEAN TOE WITH SOAP AND WATER DAILY. 4.  APPLY ANTIBIOTIC CREAM ONCE DAILY EVERY MORNING AND KEEP COVERED DURING THE DAY.  REMOVE BANDAGE AT NIGHTTIME. 5. RETURN IN TWO WEEKS FOR REEVALUATION OF BLOOD PRESSURE AND TOE WOUND.

## 2014-07-18 NOTE — Progress Notes (Signed)
Subjective:    [name] ID: [name], female    DOB: December 05, 1955, 58 y.o.   MRN: [id number]  HPI  [name] is presenting for 3 month follow up on DM. [name] recently (07/2014) had defibrillator implanted and is being followed by Cardiologist.   DM - reports compliance with medication including metformin, glimepiride, sitagliptin. Denies adverse effects including metallic taste, diarrhea, abdominal pain, low blood sugar. [name] admits not checking blood sugar, also has a difficult time with diet, tries to watch what she eats but not always despite multiple chronic conditions. Exercise is limited due to cardiomyopathy. [name] also has peripheral neuropathy bilaterally in both feet, managed with gabapentin and tramadol. Today, [name] reports that she stubbed left pinky toe 2 months ago. Noticed it because of blood on her sock when she took her shoe off. She has since has a small, non-healing wound laterally over said toe. She has tried keeping it clean, using a band-aid, airing it out, using Neosporin, mupirocin (left over from previous cellulitis in same toe) but it still has not healed for the better part of 2 months. She denies pain and swelling in her toe/foot, warmth, bleeding or pus, fevers, n/v. Of note,in early June 2015, [name] underwent a course of antibiotics for cellulitis in same toe, resolved thereafter.  Lightheadedness - [name] reports 2 hours of feeling lightheaded this am. Denies falling or syncope, vertigo, chest pain, palpitations, sob, vision changes, headache. Lasted until about noon when she ate at [facility]. She is on 3 BP lowering medications. Of note, during this past admission (07/2014), [name] was placed on a baseline torsemide dose QOD for management of congestive heart failure. Previously, [name] would only take dose if weight gain was suggestive of acute volume overload. No other aggravating or relieving factors.  Review of Systems As in  subjective.    Objective:   Physical Exam  Vitals reviewed. Constitutional: She is oriented to person, place, and time. She appears well-developed and well-nourished. No distress.  BP 100/44  Pulse 79  Temp(Src) 98.1 F (36.7 C) (Oral)  Resp 16  Ht 5' 7.25" (1.708 m)  Wt 260 lb 12.8 oz (118.298 kg)  BMI 40.55 kg/m2  SpO2 95%  BP Readings from Last 3 Encounters: 07/18/14 : 100/44 07/07/14 : 128/64 07/07/14 : 128/64   Neck: Neck supple. No JVD present. No thyromegaly present.  Cardiovascular: Normal rate, regular rhythm and normal heart sounds.  Exam reveals no gallop and no friction rub.   No murmur heard. Pulmonary/Chest: Effort normal and breath sounds normal. No respiratory distress. She has no wheezes. She has no rales.  Abdominal: Soft. Bowel sounds are normal. She exhibits no distension. There is no tenderness.  Musculoskeletal: Normal range of motion. She exhibits no edema and no tenderness.  Neurological: She is alert and oriented to person, place, and time.  Skin: Skin is warm and dry. She is not diaphoretic.  Psychiatric: She has a normal mood and affect. Her behavior is normal.  Diabetic foot exam: 1/2cm open wound in lateral, left 5th toe with small amount of clear drainage but no bleeding, toe is erythematous but cool to touch; decreased sensation bilaterally, dorsalis pedis pulses 1+ bilaterally, posterior tibial pulse not palpated bilaterally.      Assessment & Plan:   [name] is a 58 y.o. female presenting today for 3 month follow up on multiple chronic issues. She is doing better s/p ICD implantation (07/2014) for chronic congestive heart failure. She is being followed  by her cardiologist. Overall her multiple chronic conditions are stable; however, [name] is non-compliant with her diet. Counseled on the importance of maintaining healthy habits in the setting of her chronic conditions and physical limitations. Plan for follow up in 3 months.  1. Diabetes  mellitus without complication Repeat labs today, counseled [name] on importance of dietary compliance, blood sugar checks, continue diabetic medications - HM Diabetes Foot Exam - Comprehensive metabolic panel - Hemoglobin A1c - Microalbumin, urine  2. Need for hepatitis B vaccination - Hepatitis B vaccine adult IM  3. Essential hypertension - Borderline hypotensive today otherwise last 3 BP readings stable - Lightheadedness possibly related to multiple factors including BP lowering medications and possible low blood sugar given that [name] was fasting until noon and is on 3 diabetic medications including a sulfonylurea, monitor  - CBC with Differential - Comprehensive metabolic panel  4. Dyslipidemia Stable, continue pravastatin, diet as above  5. Cellulitis of toe, left Secondary to diabetes and compromised circulation, Rx doxycycline, advised to return to clinic if symptoms worsen   Jaynee Eagles, PA-C [facility] [phone] 07/18/2014 8:01 PM

## 2014-07-19 LAB — MICROALBUMIN, URINE: Microalb, Ur: 41.2 mg/dL — ABNORMAL HIGH (ref <2.0)

## 2014-07-29 NOTE — Progress Notes (Signed)
History and physical examinations obtained with Jaynee Eagles, PA-C.  1. DMII: improved control; tolerating Januvia.  2.  HTN: overcorrected today; advise to HOLD Hydralazine today.  If BP remains low, to contact cardiology; may need a decrease in Hydralazine dose since taking diuretic every other day.  3.  Toe wound fifth: New.  Rx for Doxycycline provided; local wound care reviewed; to keep covered with bandage during the day and to remove bandage qhs.  Close follow-up in two weeks and RTC sooner if worsens.  4.  Hyperlipidemia: moderately controlled; obtain labs.  5.  Systolic HF: stable; s/p defibrillator placement; doing well. Followed closely by cardiology.

## 2014-08-01 ENCOUNTER — Ambulatory Visit: Payer: Medicare Other | Admitting: Family Medicine

## 2014-08-07 ENCOUNTER — Ambulatory Visit (HOSPITAL_BASED_OUTPATIENT_CLINIC_OR_DEPARTMENT_OTHER): Payer: Medicare Other | Attending: Cardiology

## 2014-08-07 VITALS — Ht 68.0 in | Wt 262.0 lb

## 2014-08-07 DIAGNOSIS — R0683 Snoring: Secondary | ICD-10-CM

## 2014-08-07 DIAGNOSIS — G4752 REM sleep behavior disorder: Secondary | ICD-10-CM | POA: Insufficient documentation

## 2014-08-07 DIAGNOSIS — G4733 Obstructive sleep apnea (adult) (pediatric): Secondary | ICD-10-CM

## 2014-08-07 DIAGNOSIS — G478 Other sleep disorders: Secondary | ICD-10-CM | POA: Insufficient documentation

## 2014-08-08 ENCOUNTER — Telehealth: Payer: Self-pay | Admitting: Cardiology

## 2014-08-08 NOTE — Sleep Study (Signed)
NAME: [name] DATE OF BIRTH:  Jul 14, 1956 MEDICAL RECORD NUMBER [id number]  LOCATION: [facility]  PHYSICIAN: Rynell Ciotti R  DATE OF STUDY: 08/07/2014  SLEEP STUDY TYPE: Nocturnal Polysomnogram               REFERRING PHYSICIAN: Laurey Morale, MD  INDICATION FOR STUDY: loud snoring, sleep talking, hypersomnia  EPWORTH SLEEPINESS SCORE: 13 HEIGHT: 5\' 8"  (172.7 cm)  WEIGHT: 262 lb (118.842 kg)    Body mass index is 39.85 kg/(m^2).  NECK SIZE: 15 in. BMI: 40  MEDICATIONS: Reviewed in the chart  SLEEP ARCHITECTURE: The patient slept for a total of 309 minutes with no slow wave sleep and only 31 minutes of REM sleep.  The onset to sleep latency was 17 minutes and onset to REM latency was markedly prolonged at 227 minutes.  The percent sleep efficiency was slightly reduced at 84.3%.    RESPIRATORY DATA: There were 13 obstructive apneas, 5 central apneas and 101 hypopneas during sleep.  The total AHI was 23.1 events per hour but increased to 87 events per hour during REM sleep.  Overall most  events occurred during NREM sleep in the supine position.    OXYGEN DATA: The nadir O2 sat during REM sleep was 77% and in NREM sleep 79%.  Total minutes with O2 sat 88% was 37 minutes.  CARDIAC DATA: The patient maintained a paced rhythm during the study  MOVEMENT/PARASOMNIA: There was evidence of some periodic limb movements but the Index was in the normal range at 3.3 movements per hour.   IMPRESSION/ RECOMMENDATION:   1.  Moderate Obstructive sleep apnea/hypopnea syndrome with overall AHI of 23.1 events per hour.  During REM sleep the AHI was 87 events per hour.   2.  Morbid obesity 3.  Reduced sleep efficiency with increased frequency of arousals due to respiratory events.   4.  Reduced REM sleep with prolonged time to REM sleep.    5.  Moderate snoring was noted and associated with respiratory events. 6.  The patient would benefit from a trial of CPAP  therapy.   7.  Recommend CPAP titration  Signed: Quintella Reichert Diplomate, American Board of Sleep Medicine  ELECTRONICALLY SIGNED ON:  08/08/2014, 5:09 PM [facility] PH: [phone]   FX: [phone] ACCREDITED BY THE AMERICAN ACADEMY OF SLEEP MEDICINE

## 2014-08-08 NOTE — Telephone Encounter (Signed)
Please let patient know that she has moderate to severe OSA and set up for CPAP titration.  Please forward this to [facility] to set up. She has severe O2 desats during her APnea so study needs to be done ASAP.

## 2014-08-11 ENCOUNTER — Telehealth: Payer: Self-pay | Admitting: Cardiology

## 2014-08-11 DIAGNOSIS — G4733 Obstructive sleep apnea (adult) (pediatric): Secondary | ICD-10-CM

## 2014-08-11 NOTE — Telephone Encounter (Signed)
Informed patient that per Dr. Radford Pax, she has moderate to severe OSA and needs to be set up for CPAP titration.   Ordering and sending to [facility] to set up titration.

## 2014-08-11 NOTE — Telephone Encounter (Signed)
New message     Want sleep study results

## 2014-08-11 NOTE — Telephone Encounter (Signed)
Left message to call back  

## 2014-08-15 ENCOUNTER — Ambulatory Visit (HOSPITAL_COMMUNITY)
Admission: RE | Admit: 2014-08-15 | Discharge: 2014-08-15 | Disposition: A | Discharge status: Home / Self Care | Payer: Medicare Other | Source: Ambulatory Visit | Attending: Internal Medicine | Admitting: Internal Medicine

## 2014-08-15 ENCOUNTER — Telehealth: Payer: Self-pay | Admitting: Cardiology

## 2014-08-15 VITALS — BP 102/52 | HR 82 | Wt 255.8 lb

## 2014-08-15 DIAGNOSIS — I519 Heart disease, unspecified: Secondary | ICD-10-CM | POA: Insufficient documentation

## 2014-08-15 DIAGNOSIS — I255 Ischemic cardiomyopathy: Secondary | ICD-10-CM | POA: Insufficient documentation

## 2014-08-15 DIAGNOSIS — Z7982 Long term (current) use of aspirin: Secondary | ICD-10-CM | POA: Insufficient documentation

## 2014-08-15 DIAGNOSIS — E7800 Pure hypercholesterolemia, unspecified: Secondary | ICD-10-CM

## 2014-08-15 DIAGNOSIS — I10 Essential (primary) hypertension: Secondary | ICD-10-CM | POA: Insufficient documentation

## 2014-08-15 DIAGNOSIS — G4733 Obstructive sleep apnea (adult) (pediatric): Secondary | ICD-10-CM | POA: Diagnosis not present

## 2014-08-15 DIAGNOSIS — Z955 Presence of coronary angioplasty implant and graft: Secondary | ICD-10-CM | POA: Insufficient documentation

## 2014-08-15 DIAGNOSIS — I739 Peripheral vascular disease, unspecified: Secondary | ICD-10-CM | POA: Insufficient documentation

## 2014-08-15 DIAGNOSIS — I252 Old myocardial infarction: Secondary | ICD-10-CM | POA: Insufficient documentation

## 2014-08-15 DIAGNOSIS — Z95810 Presence of automatic (implantable) cardiac defibrillator: Secondary | ICD-10-CM | POA: Insufficient documentation

## 2014-08-15 DIAGNOSIS — E1140 Type 2 diabetes mellitus with diabetic neuropathy, unspecified: Secondary | ICD-10-CM | POA: Insufficient documentation

## 2014-08-15 DIAGNOSIS — I2510 Atherosclerotic heart disease of native coronary artery without angina pectoris: Secondary | ICD-10-CM | POA: Insufficient documentation

## 2014-08-15 DIAGNOSIS — I5022 Chronic systolic (congestive) heart failure: Secondary | ICD-10-CM | POA: Diagnosis present

## 2014-08-15 DIAGNOSIS — Z993 Dependence on wheelchair: Secondary | ICD-10-CM | POA: Diagnosis not present

## 2014-08-15 DIAGNOSIS — Z79899 Other long term (current) drug therapy: Secondary | ICD-10-CM | POA: Insufficient documentation

## 2014-08-15 LAB — BASIC METABOLIC PANEL
Anion gap: 15 (ref 5–15)
BUN: 26 mg/dL — AB (ref 6–23)
CHLORIDE: 100 meq/L (ref 96–112)
CO2: 26 mEq/L (ref 19–32)
Calcium: 9.3 mg/dL (ref 8.4–10.5)
Creatinine, Ser: 1.33 mg/dL — ABNORMAL HIGH (ref 0.50–1.10)
GFR calc Af Amer: 49 mL/min — ABNORMAL LOW (ref >90)
GFR, EST NON AFRICAN AMERICAN: 42 mL/min — AB (ref >90)
Glucose, Bld: 148 mg/dL — ABNORMAL HIGH (ref 70–99)
Potassium: 5.1 mEq/L (ref 3.7–5.3)
Sodium: 141 mEq/L (ref 137–147)

## 2014-08-15 LAB — PRO B NATRIURETIC PEPTIDE: Pro B Natriuretic peptide (BNP): 3808 pg/mL — ABNORMAL HIGH (ref 0–125)

## 2014-08-15 MED ORDER — IVABRADINE HCL 5 MG PO TABS: 2.5000 mg | ORAL_TABLET | Freq: Two times a day (BID) | ORAL | Status: DC

## 2014-08-15 NOTE — Telephone Encounter (Signed)
New message     Dr Aundra Dubin want to know where pt is in setting up CPAP titration.  Patient saw Dr Aundra Dubin this am

## 2014-08-15 NOTE — Progress Notes (Signed)
Patient ID: [name], female   DOB: Sep 12, 1956, 58 y.o.   MRN: [id number]   Weight Range   275 pounds  Baseline proBNP   5000.6    HPI: [name] is a 58 y.o. female with past medical history of HL, morbid obesity, chronic pain and DM since she was 58 years old. She had neuropathy in 2011 causing her to be in a wheelchair, this recovered after 8 months of therapy.   Admitted to [facility] 10/13 with progressive dyspnea and lower extremity edema.  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  Underwent complex PTCA and stenting of LAD by Dr. Burt Knack.  She was placed on Brilinta and ASA for 1 year, now on Plavix and ASA.    She has been unable to tolerate spironolactone or lisinopril due to hyperkalemia.  ECHO 10/29/12 EF 35-40%  ECHO 03/11/13 EF 30% ECHO 7/15 EF 30%, inferior akinesis with global hypokinesis, mild MR, normal RV.   Peripheral arterial dopplers 8/15 with normal ABIs bilaterally Sleep study 11/15 with severe OSA  Medtronic CRT-D implantation 9/15.   Follow up:  Stable since last appointment.  She has been taking torsemide every other day.  She can walk around her house without problems.  She is short of breath walking 1 block or up stairs.  No orthopnea or PND.  No chest pain.  Weight is down 8 lbs since last appointment. She has generalized fatigue, falls asleep easily, and snores loudly.  Sleep study showed moderate to severe OSA, still awaiting CPAP titration.  Also of note, creatinine has been up, most recently 1.7.   Labs (6/15): LDL 19, TGs 390 Labs (8/15): K 4.9, creatinine 1.27 Labs (10/15): K 5.2, creatinine 1.7, HCT 31.5  ROS: All systems negative except as listed in HPI, PMH and Problem List.  Past Medical History  Diagnosis Date  . Obesity, unspecified   . Unspecified vitamin D deficiency   . Pure hypercholesterolemia   . Neuropathy 2011  . Chicken pox   . Coronary artery disease   . CHF  (congestive heart failure) 07/19/2012    EF 15% by cardiac catheterization.  Admission.  . Hypertension   . Anemia   . Automatic implantable cardioverter-defibrillator in situ   . Type II diabetes mellitus   . Myocardial infarct 07/19/12    s/p cardiac catheterization with stenting to LAD.  Marland Kitchen History of blood transfusion ~ 2011    "plasma; had neuropathy; couldn't walk"    Current Outpatient Prescriptions  Medication Sig Dispense Refill  . aspirin EC 81 MG tablet Take 1 tablet (81 mg total) by mouth daily. 90 tablet 3  . carvedilol (COREG) 25 MG tablet Take 1 tablet (25 mg total) by mouth 2 (two) times daily with a meal. 180 tablet 3  . clopidogrel (PLAVIX) 75 MG tablet Take 1 tablet (75 mg total) by mouth daily. 90 tablet 3  . doxycycline (VIBRAMYCIN) 100 MG capsule Take 1 capsule (100 mg total) by mouth 2 (two) times daily. 20 capsule 0  . ferrous sulfate 325 (65 FE) MG tablet Take 325 mg by mouth 2 (two) times daily with a meal.    . gabapentin (NEURONTIN) 600 MG tablet Take 1 tablet (600 mg total) by mouth 3 (three) times daily. 270 tablet 3  . glimepiride (AMARYL) 4 MG tablet Take 1 tablet (4 mg total) by mouth 2 (two) times daily before a meal. 180 tablet  3  . glucose blood test strip Test blood sugar daily. Dx code: 250.00 100 each 4  . hydrALAZINE (APRESOLINE) 50 MG tablet Take 1 tablet (50 mg total) by mouth 3 (three) times daily. 270 tablet 3  . isosorbide mononitrate (IMDUR) 60 MG 24 hr tablet Take 1 tablet (60 mg total) by mouth daily. 90 tablet 3  . metFORMIN (GLUCOPHAGE) 1000 MG tablet Take 1,000 mg by mouth daily.    . nitroGLYCERIN (NITROSTAT) 0.4 MG SL tablet Place 1 tablet (0.4 mg total) under the tongue every 5 (five) minutes as needed for chest pain. 30 tablet 0  . nortriptyline (PAMELOR) 50 MG capsule Take 1 capsule (50 mg total) by mouth at bedtime. 90 capsule 3  . pravastatin (PRAVACHOL) 40 MG tablet Take 1 tablet (40 mg total) by mouth at bedtime. 90 tablet 3  .  sitaGLIPtin (JANUVIA) 100 MG tablet Take 1 tablet (100 mg total) by mouth daily. 90 tablet 1  . torsemide (DEMADEX) 20 MG tablet Take 1 tablet (20 mg total) by mouth every other day. 45 tablet 3  . traMADol (ULTRAM) 50 MG tablet Take 1 tablet (50 mg total) by mouth every 6 (six) hours as needed. For pain 180 tablet 2  . ivabradine HCl (CORLANOR) 5 MG TABS tablet Take 0.5 tablets (2.5 mg total) by mouth 2 (two) times daily with a meal. 60 tablet 3   No current facility-administered medications for this encounter.    Filed Vitals:   08/15/14 0947  BP: 102/52  Pulse: 82  Weight: 255 lb 12 oz (116.007 kg)  SpO2: 98%   PHYSICAL EXAM: General: Obese, Well appearing. No resp difficulty, mother present HEENT: normal  Neck: supple. JVP 7 cm. Carotids 2+ bilat; no bruits. No lymphadenopathy or thryomegaly appreciated.  Cor: PMI nondisplaced. Regular rate & rhythm. No S3. I am unable to palpate her pedal pulses.  Lungs: clear  Abdomen: obese, soft, nontender, nondistended. No hepatosplenomegaly. No bruits or masses. Good bowel sounds.  Extremities: no cyanosis, clubbing, rash.  No edema.  Neuro: alert & orientedx3, cranial nerves grossly intact. moves all 4 extremities w/o difficulty. Affect pleasant  ASSESSMENT & PLAN:  1) Chronic systolic HF: Ischemic cardiomyopathy with EF 30% on 7/15 echo.  Now s/p Medtronic CRT-D.  She does not look volume overloaded on exam today.  NYHA class II-III symptoms, overall improved.  - Continue torsemide 20 mg every other day.  BMET/BNP today, may need to cut back more on torsemide if creatinine has risen further.  - Continue current doses of Coreg and hydralazine/Imdur.   - She has not been on ACEI or spironolactone due to elevated K and creatinine.    - Add Corlanor 2.5 mg bid as HR remains in the 80s on max Coreg.  2) CAD: s/p complex PTCA and stenting of LAD 07/2012 - Continue statin, ASA and plavix 3) Hyperlipidemia: LDL not elevated, TGs quite high in  6/15.  I asked her to watch her diet and take over-the-counter fish oil.  Will recheck lipids next appointment.  4) PAD:  Difficult to palpate pedal pulses but ABIs were normal in 8/15.  5) OSA: Moderate to severe OSA.  Need to get CPAP titration arranged.   Followup in 2 months.   [name] 08/15/2014

## 2014-08-15 NOTE — Telephone Encounter (Signed)
LMVM at [facility] that per Dt Turner pt is being set up for CPAP titration for OSA. It looks like the message was sent to [facility] 08/11/14 to arrange CPAP titration-left message at HF clinic that if they had further questions they could contact [place] at [phone].

## 2014-08-15 NOTE — Patient Instructions (Signed)
START Corlanor 2.5 mg daily START Fish Oil 2 grams daily  Labs today  Your physician recommends that you schedule a follow-up appointment in: 3 months  Do the following things EVERYDAY: 1) Weigh yourself in the morning before breakfast. Write it down and keep it in a log. 2) Take your medicines as prescribed 3) Eat low salt foods-Limit salt (sodium) to 2000 mg per day.  4) Stay as active as you can everyday 5) Limit all fluids for the day to less than 2 liters 6)

## 2014-08-16 ENCOUNTER — Ambulatory Visit (HOSPITAL_BASED_OUTPATIENT_CLINIC_OR_DEPARTMENT_OTHER): Payer: Medicare Other | Attending: Cardiology

## 2014-08-16 VITALS — Ht 68.0 in | Wt 255.0 lb

## 2014-08-16 DIAGNOSIS — G4752 REM sleep behavior disorder: Secondary | ICD-10-CM | POA: Insufficient documentation

## 2014-08-16 DIAGNOSIS — G4733 Obstructive sleep apnea (adult) (pediatric): Secondary | ICD-10-CM

## 2014-08-16 DIAGNOSIS — R0683 Snoring: Secondary | ICD-10-CM | POA: Diagnosis present

## 2014-08-16 DIAGNOSIS — Z9989 Dependence on other enabling machines and devices: Secondary | ICD-10-CM

## 2014-08-26 ENCOUNTER — Telehealth: Payer: Self-pay | Admitting: Cardiology

## 2014-08-26 NOTE — Sleep Study (Signed)
NAME: [name] DATE OF BIRTH:  [birth year] MEDICAL RECORD NUMBER [id number]  LOCATION: [facility]  PHYSICIAN: Mauro Arps R  DATE OF STUDY: 08/16/2014  SLEEP STUDY TYPE: Positive Airway Pressure Titration               REFERRING PHYSICIAN: Quintella Reichert, MD  INDICATION FOR STUDY: OSA  EPWORTH SLEEPINESS SCORE: 13 HEIGHT: 5\' 8"  (172.7 cm)  WEIGHT: 255 lb (115.667 kg)    Body mass index is 38.78 kg/(m^2).  NECK SIZE: 15 in. BMI: 40  MEDICATIONS: Reviewed in the chart  SLEEP ARCHITECTURE: The patient slept for a total of 386 minutes with 3 minutes spent in slow wave sleep and 16 minutes in REM sleep.  The latency to sleep onset was 7 minutes and latency to REM sleep was markedly prolonged at 350 minutes.  The sleep efficiency was normal at 94%.    RESPIRATORY DATA: The patient was started at 5cm H2O and pressure was increased for respiratory events, snoring and arousals to 16cm H2O,  The patient was not able to achieve REM supine sleep at optimum pressure but was able to maintain the supine position without any further respiratory events at a pressure of 16cm H2O.  The nadir O2 sat that optimum pressure was 92%.  The AHI at a CPAP of 16cm H2O was 2.7 events per hour.  OXYGEN DATA: The nadir O2 sat during REM sleep was 85% and during NREM sleep was 85%.  The total time spent with O2 sats <88% was 4 minutes.The nadir O2 sat on CPAP at 16cm H2O was 92%.  CARDIAC DATA: The patient maintained NSR with PVC's during the study.  MOVEMENT/PARASOMNIA: There were an increased number of periodic limb movements during the study for a PLMS index of 15.5.  IMPRESSION/ RECOMMENDATION:   1.  Moderate Obstructive sleep apnea/hypopnea syndrome with an AHI of 23 events per hour. 2.  Successful CPAP titration to 16cm H2O in the supine position.  The patient was not able to achieve REM sleep on optimum pressure.   3.  Morbid Obesity.  The patient should be counseled on  good sleep hygiene and weight loss. 4.  The patient should be counseled to avoid sleeping in the supine position.  5.  Recommend ResMed CPAP device with heated humidifier, C flex of 3 and CPAP of 16cm H2O with small [employer] Simplus full face mask.  Signed: Quintella Reichert Diplomate, American Board of Sleep Medicine  ELECTRONICALLY SIGNED ON:  08/26/2014, 5:50 PM  [facility] PH: [phone]   FX: [phone] ACCREDITED BY THE AMERICAN ACADEMY OF SLEEP MEDICINE

## 2014-08-26 NOTE — Addendum Note (Signed)
Addended by: Sueanne Margarita on: 08/26/2014 06:13 PM   Modules accepted: Orders

## 2014-08-26 NOTE — Telephone Encounter (Signed)
Please let patient know that she had successful CPAP titration.  Please let [place] know that orders for CPAP have been placed. Please set up OV with me in 10 weeks.

## 2014-08-30 ENCOUNTER — Telehealth (HOSPITAL_COMMUNITY): Payer: Self-pay | Admitting: *Deleted

## 2014-08-30 DIAGNOSIS — I519 Heart disease, unspecified: Secondary | ICD-10-CM

## 2014-08-30 MED ORDER — IVABRADINE HCL 5 MG PO TABS: 2.5000 mg | ORAL_TABLET | Freq: Two times a day (BID) | ORAL | Status: DC

## 2014-08-30 NOTE — Telephone Encounter (Signed)
Was able to get pt's Corlanor approved, pt will be responsible for 45% of cost which will be about $168 per rep, pt does qualify for copay card so her cost will end up being about $38/month, pt is aware and states she should be able to afford this price, copay card mailed to pt and rx sent in, she will let us know if she has any issues

## 2014-08-30 NOTE — Telephone Encounter (Signed)
Informed patient she had a successful CPAP titration.  OV scheduled for 11/08/14 with Dr. Radford Pax.  Staff message sent to Darlina Guys at [facility] to inform her CPAP orders have been placed.

## 2014-09-15 ENCOUNTER — Encounter (HOSPITAL_COMMUNITY): Payer: Self-pay | Admitting: Internal Medicine

## 2014-10-18 ENCOUNTER — Telehealth (HOSPITAL_COMMUNITY): Payer: Self-pay | Admitting: Vascular Surgery

## 2014-10-18 NOTE — Telephone Encounter (Signed)
PT needs to talk to someone about her Colandor activation card.. Please advise

## 2014-10-20 NOTE — Telephone Encounter (Signed)
Attempted to return call, no answer.

## 2014-10-25 ENCOUNTER — Encounter: Payer: Self-pay | Admitting: Cardiology

## 2014-10-25 ENCOUNTER — Encounter: Payer: Self-pay | Admitting: Internal Medicine

## 2014-10-25 ENCOUNTER — Ambulatory Visit (INDEPENDENT_AMBULATORY_CARE_PROVIDER_SITE_OTHER): Payer: PPO | Admitting: Internal Medicine

## 2014-10-25 VITALS — BP 116/56 | HR 79 | Ht 68.0 in | Wt 259.6 lb

## 2014-10-25 DIAGNOSIS — Z4502 Encounter for adjustment and management of automatic implantable cardiac defibrillator: Secondary | ICD-10-CM

## 2014-10-25 DIAGNOSIS — R0781 Pleurodynia: Secondary | ICD-10-CM | POA: Diagnosis not present

## 2014-10-25 DIAGNOSIS — I5022 Chronic systolic (congestive) heart failure: Secondary | ICD-10-CM

## 2014-10-25 DIAGNOSIS — I255 Ischemic cardiomyopathy: Secondary | ICD-10-CM | POA: Diagnosis not present

## 2014-10-25 LAB — MDC_IDC_ENUM_SESS_TYPE_INCLINIC
Battery Voltage: 3.04 V
Brady Statistic AP VP Percent: 0.13 %
Brady Statistic AP VS Percent: 0.02 %
Brady Statistic RA Percent Paced: 0.15 %
Brady Statistic RV Percent Paced: 2.79 %
HIGH POWER IMPEDANCE MEASURED VALUE: 190 Ohm
HIGH POWER IMPEDANCE MEASURED VALUE: 58 Ohm
Lead Channel Impedance Value: 513 Ohm
Lead Channel Pacing Threshold Amplitude: 0.5 V
Lead Channel Pacing Threshold Pulse Width: 0.4 ms
Lead Channel Pacing Threshold Pulse Width: 0.4 ms
Lead Channel Sensing Intrinsic Amplitude: 23 mV
Lead Channel Setting Pacing Amplitude: 1.75 V
Lead Channel Setting Pacing Amplitude: 2.5 V
Lead Channel Setting Pacing Pulse Width: 0.4 ms
Lead Channel Setting Pacing Pulse Width: 0.4 ms
MDC IDC MSMT BATTERY REMAINING LONGEVITY: 109 mo
MDC IDC MSMT LEADCHNL LV PACING THRESHOLD AMPLITUDE: 0.625 V
MDC IDC MSMT LEADCHNL RA IMPEDANCE VALUE: 380 Ohm
MDC IDC MSMT LEADCHNL RA PACING THRESHOLD PULSEWIDTH: 0.4 ms
MDC IDC MSMT LEADCHNL RA SENSING INTR AMPL: 1.125 mV
MDC IDC MSMT LEADCHNL RA SENSING INTR AMPL: 1.625 mV
MDC IDC MSMT LEADCHNL RV PACING THRESHOLD AMPLITUDE: 0.75 V
MDC IDC MSMT LEADCHNL RV SENSING INTR AMPL: 24.5 mV
MDC IDC SESS DTM: 20160119153524
MDC IDC SET LEADCHNL RA PACING AMPLITUDE: 2 V
MDC IDC SET LEADCHNL RV SENSING SENSITIVITY: 0.3 mV
MDC IDC SET ZONE DETECTION INTERVAL: 250 ms
MDC IDC SET ZONE DETECTION INTERVAL: 450 ms
MDC IDC STAT BRADY AS VP PERCENT: 98.33 %
MDC IDC STAT BRADY AS VS PERCENT: 1.52 %
Zone Setting Detection Interval: 300 ms
Zone Setting Detection Interval: 350 ms

## 2014-10-25 NOTE — Progress Notes (Signed)
Review    Electrophysiology Office Note   Date:  10/25/2014   ID:  [name], DOB 12-17-1955, MRN [id number]  PCP:  [name], MD  Cardiologist:  CHF Primary Electrophysiologist:    Virl Axe, MD    Chief Complaint  Patient presents with  . Appointment    ROV     History of Present Illness: [name] is a 59 y.o. female  seen today for follow-up of CRT-D implantation undertaken 9/15. 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  She's continued to struggle with fatigue and shortness of breath. She has difficulty walking which she attributes to her legs getting weak as opposed to her breathing. She also however complains of pleuritic pain which is been persistent for now couple of months. She thinks may be a dates from after her device implantation.  She has morbid obesity. She doesn't smoke.  She has OSA on CPAP  The patient is tolerating medications without difficulties and is otherwise without complaint today.    Past Medical History  Diagnosis Date  . Obesity, unspecified   . Unspecified vitamin D deficiency   . Pure hypercholesterolemia   . Neuropathy 2011  . Chicken pox   . Coronary artery disease   . CHF (congestive heart failure) 07/19/2012    EF 15% by cardiac catheterization.  Admission.  . Hypertension   . Anemia   . Automatic implantable cardioverter-defibrillator in situ   . Type II diabetes mellitus   . Myocardial infarct 07/19/12    s/p cardiac catheterization with stenting to LAD.  Marland Kitchen History of blood transfusion ~ 2011    "plasma; had neuropathy; couldn't walk"   Past Surgical History  Procedure Laterality Date  . Lumbar disc surgery  1996    L4-5  . Incision and drainage abscess Right 2007    groin; with ICU stay due to sepsis.  . Bilateral oophorectomy  01/2011    ovarian cyst benign  . Bi-ventricular implantable cardioverter defibrillator  (crt-d)  07/06/2014  .  Laparoscopic cholecystectomy  2011  . Back surgery    . Coronary angioplasty with stent placement Left 07/2012    new onset systolic CHF; elevated troponins.  Cardiac catheterization with stenting to LAD; EF 15%.  2D-echo: EF 20-25%.  . Vaginal hysterectomy  01/2011    Fibroids/DUB.  Ovaries removed. Fontaine.  . Tubal ligation  1981  . Left heart catheterization with coronary angiogram N/A 07/21/2012    Procedure: LEFT HEART CATHETERIZATION WITH CORONARY ANGIOGRAM;  Surgeon: Jolaine Artist, MD;  Location: [facility] CATH LAB;  Service: Cardiovascular;  Laterality: N/A;  . Percutaneous coronary stent intervention (pci-s) N/A 07/23/2012    Procedure: PERCUTANEOUS CORONARY STENT INTERVENTION (PCI-S);  Surgeon: Sherren Mocha, MD;  Location: [facility] CATH LAB;  Service: Cardiovascular;  Laterality: N/A;  . Bi-ventricular implantable cardioverter defibrillator N/A 07/06/2014    Procedure: BI-VENTRICULAR IMPLANTABLE CARDIOVERTER DEFIBRILLATOR  (CRT-D);  Surgeon: Deboraha Sprang, MD;  Location: [facility] CATH LAB;  Service: Cardiovascular;  Laterality: N/A;     Current Outpatient Prescriptions  Medication Sig Dispense Refill  . aspirin EC 81 MG tablet Take 1 tablet (81 mg total) by mouth daily. 90 tablet 3  . carvedilol (COREG) 25 MG tablet Take 1 tablet (25 mg total) by mouth 2 (two) times daily with a meal. 180 tablet 3  . clopidogrel (PLAVIX) 75 MG tablet Take 1 tablet (75 mg total) by mouth daily. 90 tablet 3  . doxycycline (  VIBRAMYCIN) 100 MG capsule Take 1 capsule (100 mg total) by mouth 2 (two) times daily. 20 capsule 0  . ferrous sulfate 325 (65 FE) MG tablet Take 325 mg by mouth 2 (two) times daily with a meal.    . gabapentin (NEURONTIN) 600 MG tablet Take 1 tablet (600 mg total) by mouth 3 (three) times daily. 270 tablet 3  . glimepiride (AMARYL) 4 MG tablet Take 1 tablet (4 mg total) by mouth 2 (two) times daily before a meal. 180 tablet 3  . glucose blood test strip Test blood sugar daily. Dx code:  250.00 100 each 4  . hydrALAZINE (APRESOLINE) 50 MG tablet Take 1 tablet (50 mg total) by mouth 3 (three) times daily. 270 tablet 3  . isosorbide mononitrate (IMDUR) 60 MG 24 hr tablet Take 1 tablet (60 mg total) by mouth daily. 90 tablet 3  . metFORMIN (GLUCOPHAGE) 1000 MG tablet Take 1,000 mg by mouth daily.    . nitroGLYCERIN (NITROSTAT) 0.4 MG SL tablet Place 1 tablet (0.4 mg total) under the tongue every 5 (five) minutes as needed for chest pain. 30 tablet 0  . nortriptyline (PAMELOR) 50 MG capsule Take 1 capsule (50 mg total) by mouth at bedtime. 90 capsule 3  . pravastatin (PRAVACHOL) 40 MG tablet Take 1 tablet (40 mg total) by mouth at bedtime. 90 tablet 3  . sitaGLIPtin (JANUVIA) 100 MG tablet Take 1 tablet (100 mg total) by mouth daily. 90 tablet 1  . torsemide (DEMADEX) 20 MG tablet Take 1 tablet (20 mg total) by mouth every other day. 45 tablet 3  . traMADol (ULTRAM) 50 MG tablet Take 1 tablet (50 mg total) by mouth every 6 (six) hours as needed. For pain 180 tablet 2  . ivabradine (CORLANOR) 5 MG TABS tablet Take 0.5 tablets (2.5 mg total) by mouth 2 (two) times daily with a meal. (Patient not taking: Reported on 10/25/2014) 30 tablet 3   No current facility-administered medications for this visit.    Allergies:   Review of patient's allergies indicates no known allergies.   Social History:  The patient  reports that she has never smoked. She has never used smokeless tobacco. She reports that she does not drink alcohol or use illicit drugs.   Family History:  The patient's family history includes Arthritis in her mother; Cancer in her father; Diabetes in her maternal grandmother, mother, and paternal grandmother; Hypertension in her mother; Obesity in her brother.    ROS:  Please see the history of present illness.   .   All other systems are reviewed and negative.    PHYSICAL EXAM: VS:  BP 116/56 mmHg  Pulse 79  Ht 5\' 8"  (1.727 m)  Wt 259 lb 9.6 oz (117.754 kg)  BMI 39.48  kg/m2 , BMI Body mass index is 39.48 kg/(m^2). GEN: Well nourished, well developed, in no acute distress HEENT: normal Neck: no JVD, carotid bruits, or masses Cardiac: REGULAR RATE and RHYTHM ; 2/6  murmurs, rubs, + S4  Back without kyphosis or CVAT Respiratory:  clear to auscultation bilaterally, normal work of breathing GI: soft, nontender, nondistended, + BS MS: no deformity or atrophy Extremities no clubbing cyanosis 1+ edema Skin: warm and dry,  device pocket is well healed Neuro:  Strength and sensation are intact Psych: euthymic mood, full affect  EKG:  EKG is ordered today. The ekg ordered today shows P-synchronous/ AV  pacing QRS morphology is down in lead V1   Device interrogation is reviewed  today in detail.  See PaceArt for details.   Recent Labs: 07/18/2014: ALT 8; Hemoglobin 10.4*; Platelets 213 08/15/2014: BUN 26*; Creatinine 1.33*; Potassium 5.1; Pro B Natriuretic peptide (BNP) 3808.0*; Sodium 141    Lipid Panel     Component Value Date/Time   CHOL 124 03/30/2014 1028   TRIG 390* 03/30/2014 1028   HDL 27* 03/30/2014 1028   CHOLHDL 4.6 03/30/2014 1028   VLDL 78* 03/30/2014 1028   LDLCALC 19 03/30/2014 1028     Wt Readings from Last 3 Encounters:  10/25/14 259 lb 9.6 oz (117.754 kg)  08/16/14 255 lb (115.667 kg)  08/07/14 262 lb (118.842 kg)      Other studies Reviewed:    ASSESSMENT AND PLAN: Ischemic cardiomyopathy  CRT-D  CHF chronic systolic  Pleuritic chest pain  Abnormal QRS vector  Renal Insufficiency    There is been no significant improvement in symptoms services [place] following CRT implantation.  ECG then and now showed a negative QRS in lead V1  This was using pulse 2:3. We have reprogrammed her device today 4-RV coil. We will check a chest x-ray to assess lead location.  She has some degree of volume overload. We will have her increase her torsemide to take daily.  She will need a follow-up metabolic profile in about 2  weeks time.  I'm concerned about the pleuritic pain. I wonder if there isn't any microperforation that has persisted since implantation. We will check an echocardiogram and anticipate lead revision.     Current medicines are reviewed at length with the patient today.   The patient does not have concerns regarding her medicines.  The following changes were made today:  none  Labs/ tests ordered today include: eco cxr  No orders of the defined types were placed in this encounter.     Disposition:   FU with me 9 month(s)  Signed, Virl Axe, MD  10/25/2014 2:35 PM     [address] [place] [address] [phone] (office) [phone] (fax)

## 2014-10-25 NOTE — Patient Instructions (Signed)
Your physician recommends that you continue on your current medications as directed. Please refer to the Current Medication list given to you today.  A chest x-ray takes a picture of the organs and structures inside the chest, including the heart, lungs, and blood vessels. This test can show several things, including, whether the heart is enlarges; whether fluid is building up in the lungs; and whether pacemaker / defibrillator leads are still in place.  Your physician has requested that you have an echocardiogram. Echocardiography is a painless test that uses sound waves to create images of your heart. It provides your doctor with information about the size and shape of your heart and how well your heart's chambers and valves are working. This procedure takes approximately one hour. There are no restrictions for this procedure.  Remote monitoring is used to monitor your Pacemaker of ICD from home. This monitoring reduces the number of office visits required to check your device to one time per year. It allows [place] to keep an eye on the functioning of your device to ensure it is working properly. You are scheduled for a device check from home on 01/24/15. You may send your transmission at any time that day. If you have a wireless device, the transmission will be sent automatically. After your physician reviews your transmission, you will receive a postcard with your next transmission date.  Your physician wants you to follow-up in: 9 months with Dr. Caryl Comes.  You will receive a reminder letter in the mail two months in advance. If you don't receive a letter, please call our office to schedule the follow-up appointment.

## 2014-11-01 ENCOUNTER — Ambulatory Visit (HOSPITAL_BASED_OUTPATIENT_CLINIC_OR_DEPARTMENT_OTHER): Payer: PPO | Admitting: Radiology

## 2014-11-01 ENCOUNTER — Ambulatory Visit (HOSPITAL_COMMUNITY)
Admission: RE | Admit: 2014-11-01 | Discharge: 2014-11-01 | Disposition: A | Discharge status: Home / Self Care | Payer: PPO | Source: Ambulatory Visit | Attending: Internal Medicine | Admitting: Internal Medicine

## 2014-11-01 DIAGNOSIS — J90 Pleural effusion, not elsewhere classified: Secondary | ICD-10-CM | POA: Diagnosis not present

## 2014-11-01 DIAGNOSIS — R0781 Pleurodynia: Secondary | ICD-10-CM

## 2014-11-01 DIAGNOSIS — I5022 Chronic systolic (congestive) heart failure: Secondary | ICD-10-CM

## 2014-11-01 NOTE — Progress Notes (Signed)
Echocardiogram performed.  

## 2014-11-08 ENCOUNTER — Ambulatory Visit (INDEPENDENT_AMBULATORY_CARE_PROVIDER_SITE_OTHER): Payer: PPO | Admitting: Cardiology

## 2014-11-08 ENCOUNTER — Ambulatory Visit (INDEPENDENT_AMBULATORY_CARE_PROVIDER_SITE_OTHER): Payer: PPO | Admitting: *Deleted

## 2014-11-08 ENCOUNTER — Encounter: Payer: Self-pay | Admitting: Cardiology

## 2014-11-08 VITALS — BP 100/48 | HR 80 | Ht 68.0 in | Wt 258.8 lb

## 2014-11-08 DIAGNOSIS — I10 Essential (primary) hypertension: Secondary | ICD-10-CM

## 2014-11-08 DIAGNOSIS — I519 Heart disease, unspecified: Secondary | ICD-10-CM

## 2014-11-08 DIAGNOSIS — G4733 Obstructive sleep apnea (adult) (pediatric): Secondary | ICD-10-CM

## 2014-11-08 LAB — MDC_IDC_ENUM_SESS_TYPE_INCLINIC
Battery Remaining Longevity: 105 mo
Battery Voltage: 3.01 V
Brady Statistic AP VP Percent: 0.1 %
Brady Statistic AP VS Percent: 0.01 %
Brady Statistic AS VP Percent: 99.84 %
Brady Statistic AS VS Percent: 0.05 %
Brady Statistic RA Percent Paced: 0.11 %
Brady Statistic RV Percent Paced: 99.88 %
Date Time Interrogation Session: 20160202105331
HighPow Impedance: 209 Ohm
HighPow Impedance: 73 Ohm
Lead Channel Impedance Value: 380 Ohm
Lead Channel Impedance Value: 513 Ohm
Lead Channel Pacing Threshold Amplitude: 0.5 V
Lead Channel Pacing Threshold Amplitude: 0.75 V
Lead Channel Pacing Threshold Amplitude: 0.75 V
Lead Channel Pacing Threshold Pulse Width: 0.4 ms
Lead Channel Pacing Threshold Pulse Width: 0.4 ms
Lead Channel Pacing Threshold Pulse Width: 0.4 ms
Lead Channel Sensing Intrinsic Amplitude: 1.625 mV
Lead Channel Sensing Intrinsic Amplitude: 1.625 mV
Lead Channel Sensing Intrinsic Amplitude: 23.375 mV
Lead Channel Sensing Intrinsic Amplitude: 23.375 mV
Lead Channel Setting Pacing Amplitude: 1.75 V
Lead Channel Setting Pacing Amplitude: 2.75 V
Lead Channel Setting Pacing Amplitude: 2.75 V
Lead Channel Setting Pacing Pulse Width: 0.4 ms
Lead Channel Setting Pacing Pulse Width: 0.4 ms
Lead Channel Setting Sensing Sensitivity: 0.3 mV
Zone Setting Detection Interval: 250 ms
Zone Setting Detection Interval: 300 ms
Zone Setting Detection Interval: 350 ms
Zone Setting Detection Interval: 450 ms

## 2014-11-08 NOTE — Progress Notes (Signed)
Due to successful increase in EF to 50-55%, changed pt's settings back to previous parameters prior to changes made on 10/25/14. Changed Nonadaptive CRT back to Adaptive BiV & LV. Changed LV vector from LV1>>RV coil back to LV3>>LV2. Follow up as planned.

## 2014-11-08 NOTE — Progress Notes (Addendum)
Cardiology Office Note   Date:  11/08/2014   ID:  [name], DOB 07/30/1956, MRN [id number]  PCP:  [name], MD  Sleep Medicine:   Sueanne Margarita, MD   Chief Complaint  Patient presents with  . Sleep Apnea  . Obesity  . Hypertension      History of Present Illness: [name] is a 59 y.o. female with past medical history of HL, morbid obesity, chronic pain and DM since she was 59 years old. She had neuropathy in 2011 causing her to be in a wheelchair, this recovered after 8 months of therapy.   Admitted to [facility] 10/13 with progressive dyspnea and lower extremity edema. Found to have new onset systolic HF in setting of ICM EF 20% with chronically occluded LAD and occluded PDA. Underwent complex PTCA and stenting of LAD by Dr. Burt Knack. She was placed on Brilinta and ASA for 1 year, now on Plavix and ASA. She is followed by Dr. Aundra Dubin who recently ordered a sleep study for severe loud snoring and excessive daytime sleepiness.  PSG showed moderate OSA with an overall AHI of 23 events per hour and 87 events per hour during REM sleep.  She had reduced sleep efficiency with increased frequency of arousals from respiratory events and moderate snoring.  She underwent CPAP titration to 16cm H2O.  She uses a Event organiser and [employer]kel Simplus full face mask which she tolerates well.  He wakes up with her mouth dry but no nasal congestion.  She uses the heated humidifier with the unit.  She feels the pressure is adequate.  She feels more rested in the am and does not have as much daytime sleepiness since starting CPAP.  She no longer snores.      Past Medical History  Diagnosis Date  . Obesity, unspecified   . Unspecified vitamin D deficiency   . Pure hypercholesterolemia   . Neuropathy 2011  . Chicken pox   . Coronary artery disease   . CHF (congestive heart failure) 07/19/2012    EF 15% by cardiac catheterization.  Admission.  . Hypertension   . Anemia   . Automatic  implantable cardioverter-defibrillator in situ   . Type II diabetes mellitus   . Myocardial infarct 07/19/12    s/p cardiac catheterization with stenting to LAD.  Marland Kitchen History of blood transfusion ~ 2011    "plasma; had neuropathy; couldn't walk"  . OSA on CPAP     Moderate with AHI 23/hr and now on CPAP at 16cm H2O.  Her DME is [facility]    Past Surgical History  Procedure Laterality Date  . Lumbar disc surgery  1996    L4-5  . Incision and drainage abscess Right 2007    groin; with ICU stay due to sepsis.  . Bilateral oophorectomy  01/2011    ovarian cyst benign  . Bi-ventricular implantable cardioverter defibrillator  (crt-d)  07/06/2014  . Laparoscopic cholecystectomy  2011  . Back surgery    . Coronary angioplasty with stent placement Left 07/2012    new onset systolic CHF; elevated troponins.  Cardiac catheterization with stenting to LAD; EF 15%.  2D-echo: EF 20-25%.  . Vaginal hysterectomy  01/2011    Fibroids/DUB.  Ovaries removed. Fontaine.  . Tubal ligation  1981  . Left heart catheterization with coronary angiogram N/A 07/21/2012    Procedure: LEFT HEART CATHETERIZATION WITH CORONARY ANGIOGRAM;  Surgeon: Jolaine Artist, MD;  Location: [facility] CATH LAB;  Service: Cardiovascular;  Laterality: N/A;  .  Percutaneous coronary stent intervention (pci-s) N/A 07/23/2012    Procedure: PERCUTANEOUS CORONARY STENT INTERVENTION (PCI-S);  Surgeon: Sherren Mocha, MD;  Location: [facility] CATH LAB;  Service: Cardiovascular;  Laterality: N/A;  . Bi-ventricular implantable cardioverter defibrillator N/A 07/06/2014    Procedure: BI-VENTRICULAR IMPLANTABLE CARDIOVERTER DEFIBRILLATOR  (CRT-D);  Surgeon: Deboraha Sprang, MD;  Location: [facility] CATH LAB;  Service: Cardiovascular;  Laterality: N/A;     Current Outpatient Prescriptions  Medication Sig Dispense Refill  . aspirin EC 81 MG tablet Take 1 tablet (81 mg total) by mouth daily. 90 tablet 3  . carvedilol (COREG) 25 MG tablet Take 1 tablet (25 mg total) by  mouth 2 (two) times daily with a meal. 180 tablet 3  . clopidogrel (PLAVIX) 75 MG tablet Take 1 tablet (75 mg total) by mouth daily. 90 tablet 3  . doxycycline (VIBRAMYCIN) 100 MG capsule Take 1 capsule (100 mg total) by mouth 2 (two) times daily. 20 capsule 0  . ferrous sulfate 325 (65 FE) MG tablet Take 325 mg by mouth 2 (two) times daily with a meal.    . gabapentin (NEURONTIN) 600 MG tablet Take 1 tablet (600 mg total) by mouth 3 (three) times daily. 270 tablet 3  . glimepiride (AMARYL) 4 MG tablet Take 1 tablet (4 mg total) by mouth 2 (two) times daily before a meal. 180 tablet 3  . glucose blood test strip Test blood sugar daily. Dx code: 250.00 100 each 4  . hydrALAZINE (APRESOLINE) 50 MG tablet Take 1 tablet (50 mg total) by mouth 3 (three) times daily. 270 tablet 3  . isosorbide mononitrate (IMDUR) 60 MG 24 hr tablet Take 1 tablet (60 mg total) by mouth daily. 90 tablet 3  . metFORMIN (GLUCOPHAGE) 1000 MG tablet Take 1,000 mg by mouth daily.    . nitroGLYCERIN (NITROSTAT) 0.4 MG SL tablet Place 1 tablet (0.4 mg total) under the tongue every 5 (five) minutes as needed for chest pain. 30 tablet 0  . nortriptyline (PAMELOR) 50 MG capsule Take 1 capsule (50 mg total) by mouth at bedtime. 90 capsule 3  . pravastatin (PRAVACHOL) 40 MG tablet Take 1 tablet (40 mg total) by mouth at bedtime. 90 tablet 3  . sitaGLIPtin (JANUVIA) 100 MG tablet Take 1 tablet (100 mg total) by mouth daily. 90 tablet 1  . torsemide (DEMADEX) 20 MG tablet Take 1 tablet (20 mg total) by mouth every other day. 45 tablet 3  . traMADol (ULTRAM) 50 MG tablet Take 1 tablet (50 mg total) by mouth every 6 (six) hours as needed. For pain 180 tablet 2  . ivabradine (CORLANOR) 5 MG TABS tablet Take 0.5 tablets (2.5 mg total) by mouth 2 (two) times daily with a meal. (Patient not taking: Reported on 10/25/2014) 30 tablet 3   No current facility-administered medications for this visit.    Allergies:   Review of patient's allergies  indicates no known allergies.    Social History:  The patient  reports that she has never smoked. She has never used smokeless tobacco. She reports that she does not drink alcohol or use illicit drugs.   Family History:  The patient's family history includes Arthritis in her mother; Cancer in her father; Diabetes in her maternal grandmother, mother, and paternal grandmother; Hypertension in her mother; Obesity in her brother.    ROS:  Please see the history of present illness.   Otherwise, review of systems are positive for none.   All other systems are reviewed and negative.  PHYSICAL EXAM: VS:  BP 100/48 mmHg  Pulse 80  Ht 5\' 8"  (1.727 m)  Wt 258 lb 12.8 oz (117.391 kg)  BMI 39.36 kg/m2  SpO2 97% , BMI Body mass index is 39.36 kg/(m^2). GEN: Well nourished, well developed, in no acute distress HEENT: normal Neck: no JVD, carotid bruits, or masses Cardiac: RRR; no murmurs, rubs, or gallops,no edema  Respiratory:  clear to auscultation bilaterally, normal work of breathing GI: soft, nontender, nondistended, + BS MS: no deformity or atrophy Skin: warm and dry, no rash Neuro:  Strength and sensation are intact Psych: euthymic mood, full affect   EKG:  EKG is not ordered today.    Recent Labs: 07/18/2014: ALT 8; Hemoglobin 10.4*; Platelets 213 08/15/2014: BUN 26*; Creatinine 1.33*; Potassium 5.1; Pro B Natriuretic peptide (BNP) 3808.0*; Sodium 141    Lipid Panel    Component Value Date/Time   CHOL 124 03/30/2014 1028   TRIG 390* 03/30/2014 1028   HDL 27* 03/30/2014 1028   CHOLHDL 4.6 03/30/2014 1028   VLDL 78* 03/30/2014 1028   LDLCALC 19 03/30/2014 1028      Wt Readings from Last 3 Encounters:  11/08/14 258 lb 12.8 oz (117.391 kg)  10/25/14 259 lb 9.6 oz (117.754 kg)  08/16/14 255 lb (115.667 kg)        ASSESSMENT AND PLAN:  1.  Moderate OSA with AHI 23 events per hour.  Now on CPAP at 16cm H2O and tolerating well.  Her d/l today showed an AHI of 1.7/hr on  16cm H2O and 93% compliant in using more than 4 hours nightly.  She will continue on current settings.   2.  Obesity - She does not exercise due to leg fatigue.  I have encouraged her to try to walk as she can for exercise.   3.  HTN - controlled.  Continue coreg/hydralazine   Current medicines are reviewed at length with the patient today.  The patient does not have concerns regarding medicines.  The following changes have been made:  no change  Labs/ tests ordered today include: CPAP d/l     Disposition:   FU with me in 6 months   Signed, Sueanne Margarita, MD  11/08/2014 10:00 PM    [facility] [address], [place], [place]  [address] Phone: [phone]; Fax: [phone]

## 2014-11-08 NOTE — Patient Instructions (Signed)
Your physician recommends that you continue on your current medications as directed. Please refer to the Current Medication list given to you today.  Your physician wants you to follow-up in: 6 months. You will receive a reminder letter in the mail two months in advance. If you don't receive a letter, please call our office to schedule the follow-up appointment.  

## 2014-11-16 ENCOUNTER — Encounter: Payer: Self-pay | Admitting: Cardiology

## 2014-11-16 ENCOUNTER — Encounter: Payer: Self-pay | Admitting: Internal Medicine

## 2014-12-12 ENCOUNTER — Telehealth (HOSPITAL_COMMUNITY): Payer: Self-pay | Admitting: Vascular Surgery

## 2014-12-12 DIAGNOSIS — I5021 Acute systolic (congestive) heart failure: Secondary | ICD-10-CM

## 2014-12-12 MED ORDER — TORSEMIDE 20 MG PO TABS: 20.0000 mg | ORAL_TABLET | Freq: Every day | ORAL | Status: DC

## 2014-12-12 MED ORDER — CARVEDILOL 25 MG PO TABS: 25.0000 mg | ORAL_TABLET | Freq: Two times a day (BID) | ORAL | Status: DC

## 2014-12-12 MED ORDER — ISOSORBIDE MONONITRATE ER 60 MG PO TB24
60.0000 mg | ORAL_TABLET | Freq: Every day | ORAL | Status: DC
Start: 2014-12-12 — End: 2015-07-27

## 2014-12-12 NOTE — Telephone Encounter (Signed)
Pt called she is experiencing increased SOB , She cant walk without feeling like she is going to give out.[name] Please advise

## 2014-12-12 NOTE — Telephone Encounter (Signed)
Spoke w/pt she states she is very SOB with ambulation for the past week, she states she does have edema up to her knee. Pt does not weigh herself daily.  She states she has been taking her Torsemide 20 mg QOD as directed advised pt to take 40 mg today and tomorrow then take 20 mg daily, call us back on Thur with update or earlier if symptoms worsen, she is agreeable, refills sent in

## 2014-12-15 ENCOUNTER — Encounter (HOSPITAL_COMMUNITY): Payer: Self-pay

## 2014-12-15 ENCOUNTER — Ambulatory Visit (HOSPITAL_COMMUNITY)
Admission: RE | Admit: 2014-12-15 | Discharge: 2014-12-15 | Disposition: A | Discharge status: Home / Self Care | Payer: PPO | Source: Ambulatory Visit | Attending: Internal Medicine | Admitting: Internal Medicine

## 2014-12-15 ENCOUNTER — Telehealth (HOSPITAL_COMMUNITY): Payer: Self-pay | Admitting: Vascular Surgery

## 2014-12-15 DIAGNOSIS — I5022 Chronic systolic (congestive) heart failure: Secondary | ICD-10-CM | POA: Insufficient documentation

## 2014-12-15 DIAGNOSIS — E119 Type 2 diabetes mellitus without complications: Secondary | ICD-10-CM | POA: Insufficient documentation

## 2014-12-15 DIAGNOSIS — I2510 Atherosclerotic heart disease of native coronary artery without angina pectoris: Secondary | ICD-10-CM | POA: Insufficient documentation

## 2014-12-15 DIAGNOSIS — E785 Hyperlipidemia, unspecified: Secondary | ICD-10-CM | POA: Insufficient documentation

## 2014-12-15 DIAGNOSIS — E780 Pure hypercholesterolemia: Secondary | ICD-10-CM | POA: Diagnosis not present

## 2014-12-15 DIAGNOSIS — G4733 Obstructive sleep apnea (adult) (pediatric): Secondary | ICD-10-CM | POA: Diagnosis not present

## 2014-12-15 DIAGNOSIS — N183 Chronic kidney disease, stage 3 (moderate): Secondary | ICD-10-CM | POA: Diagnosis not present

## 2014-12-15 DIAGNOSIS — I255 Ischemic cardiomyopathy: Secondary | ICD-10-CM | POA: Diagnosis not present

## 2014-12-15 DIAGNOSIS — I252 Old myocardial infarction: Secondary | ICD-10-CM | POA: Diagnosis not present

## 2014-12-15 DIAGNOSIS — Z955 Presence of coronary angioplasty implant and graft: Secondary | ICD-10-CM | POA: Insufficient documentation

## 2014-12-15 DIAGNOSIS — Z7982 Long term (current) use of aspirin: Secondary | ICD-10-CM | POA: Insufficient documentation

## 2014-12-15 DIAGNOSIS — E875 Hyperkalemia: Secondary | ICD-10-CM | POA: Diagnosis not present

## 2014-12-15 DIAGNOSIS — Z7902 Long term (current) use of antithrombotics/antiplatelets: Secondary | ICD-10-CM | POA: Diagnosis not present

## 2014-12-15 DIAGNOSIS — Z79899 Other long term (current) drug therapy: Secondary | ICD-10-CM | POA: Insufficient documentation

## 2014-12-15 DIAGNOSIS — I519 Heart disease, unspecified: Secondary | ICD-10-CM

## 2014-12-15 DIAGNOSIS — I739 Peripheral vascular disease, unspecified: Secondary | ICD-10-CM | POA: Insufficient documentation

## 2014-12-15 LAB — BASIC METABOLIC PANEL
Anion gap: 13 (ref 5–15)
BUN: 27 mg/dL — AB (ref 6–23)
CHLORIDE: 93 mmol/L — AB (ref 96–112)
CO2: 25 mmol/L (ref 19–32)
Calcium: 8.1 mg/dL — ABNORMAL LOW (ref 8.4–10.5)
Creatinine, Ser: 1.75 mg/dL — ABNORMAL HIGH (ref 0.50–1.10)
GFR calc non Af Amer: 30 mL/min — ABNORMAL LOW (ref >90)
GFR, EST AFRICAN AMERICAN: 35 mL/min — AB (ref >90)
GLUCOSE: 95 mg/dL (ref 70–99)
POTASSIUM: 4 mmol/L (ref 3.5–5.1)
Sodium: 131 mmol/L — ABNORMAL LOW (ref 135–145)

## 2014-12-15 LAB — BRAIN NATRIURETIC PEPTIDE: B Natriuretic Peptide: 78.4 pg/mL (ref 0.0–100.0)

## 2014-12-15 MED ORDER — POTASSIUM CHLORIDE ER 10 MEQ PO TBCR: 20.0000 meq | EXTENDED_RELEASE_TABLET | Freq: Every day | ORAL | Status: DC

## 2014-12-15 MED ORDER — TORSEMIDE 20 MG PO TABS: ORAL_TABLET | ORAL | Status: DC

## 2014-12-15 NOTE — Telephone Encounter (Deleted)
Nurse with [facility] called to report 8lb weight gain overnight.  No reports of swelling, edema, or SOB.  Patient "feels fine", however BP 89/53.  States has been running low per telemonitoring system for past couple of days.  Will fax over weight and BP trends to review.  Renee Pain

## 2014-12-15 NOTE — Telephone Encounter (Signed)
Patient added on to afternoon schedule to be seen in clinic.  Patient aware and agreeable.  [name]

## 2014-12-15 NOTE — Progress Notes (Signed)
Patient ID: [name], female   DOB: 12/01/1955, 59 y.o.   MRN: [id number]   Weight Range   275 pounds  Baseline proBNP   5000.6    HPI: [name] is a 59 y.o. female with past medical history of HL, morbid obesity, chronic pain and DM since she was 59 years old. She had neuropathy in 2011 causing her to be in a wheelchair, this recovered after 8 months of therapy.   Admitted to [facility] 10/13 with progressive dyspnea and lower extremity edema.  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  Underwent complex PTCA and stenting of LAD by Dr. Burt Knack.  She was placed on Brilinta and ASA for 1 year, now on Plavix and ASA.    She has been unable to tolerate spironolactone or lisinopril due to hyperkalemia.  ECHO 10/29/12 EF 35-40%  ECHO 03/11/13 EF 30% ECHO 7/15 EF 30%, inferior akinesis with global hypokinesis, mild MR, normal RV.   Peripheral arterial dopplers 8/15 with normal ABIs bilaterally Sleep study 11/15 with severe OSA  Medtronic CRT-D implantation 9/15.   She presents today for acute work in due to increased dyspnea. Complains of increased dyspnea and weight gain over the last month. Has not been weighing at home. She took extra torsemide on Mon-Tues.  She takes 40 mg torsemide at bed time. Sleep study showed moderate to severe OSA, still awaiting CPAP titration.  Appetite big salty foods. Had ham and cheese  Biscuit for breakfast.   Labs (6/15): LDL 19, TGs 390 Labs (8/15): K 4.9, creatinine 1.27 Labs (10/15): K 5.2, creatinine 1.7, HCT 31.5  ECO 10/2014 EF 50-55% Grade IDD   ROS: All systems negative except as listed in HPI, PMH and Problem List.  Past Medical History  Diagnosis Date  . Obesity, unspecified   . Unspecified vitamin D deficiency   . Pure hypercholesterolemia   . Neuropathy 2011  . Chicken pox   . Coronary artery disease   . CHF (congestive heart failure) 07/19/2012    EF 15% by cardiac  catheterization.  Admission.  . Hypertension   . Anemia   . Automatic implantable cardioverter-defibrillator in situ   . Type II diabetes mellitus   . Myocardial infarct 07/19/12    s/p cardiac catheterization with stenting to LAD.  Marland Kitchen History of blood transfusion ~ 2011    "plasma; had neuropathy; couldn't walk"  . OSA on CPAP     Moderate with AHI 23/hr and now on CPAP at 16cm H2O.  Her DME is [facility]    Current Outpatient Prescriptions  Medication Sig Dispense Refill  . aspirin EC 81 MG tablet Take 1 tablet (81 mg total) by mouth daily. 90 tablet 3  . carvedilol (COREG) 25 MG tablet Take 1 tablet (25 mg total) by mouth 2 (two) times daily with a meal. 180 tablet 3  . clopidogrel (PLAVIX) 75 MG tablet Take 1 tablet (75 mg total) by mouth daily. 90 tablet 3  . doxycycline (VIBRAMYCIN) 100 MG capsule Take 1 capsule (100 mg total) by mouth 2 (two) times daily. 20 capsule 0  . ferrous sulfate 325 (65 FE) MG tablet Take 325 mg by mouth 2 (two) times daily with a meal.    . gabapentin (NEURONTIN) 600 MG tablet Take 1 tablet (600 mg total) by mouth 3 (three) times daily. 270 tablet 3  . glimepiride (AMARYL) 4 MG tablet Take 1 tablet (4 mg  total) by mouth 2 (two) times daily before a meal. 180 tablet 3  . glucose blood test strip Test blood sugar daily. Dx code: 250.00 100 each 4  . hydrALAZINE (APRESOLINE) 50 MG tablet Take 1 tablet (50 mg total) by mouth 3 (three) times daily. 270 tablet 3  . isosorbide mononitrate (IMDUR) 60 MG 24 hr tablet Take 1 tablet (60 mg total) by mouth daily. 90 tablet 3  . ivabradine (CORLANOR) 5 MG TABS tablet Take 0.5 tablets (2.5 mg total) by mouth 2 (two) times daily with a meal. 30 tablet 3  . metFORMIN (GLUCOPHAGE) 1000 MG tablet Take 1,000 mg by mouth daily.    . nitroGLYCERIN (NITROSTAT) 0.4 MG SL tablet Place 1 tablet (0.4 mg total) under the tongue every 5 (five) minutes as needed for chest pain. 30 tablet 0  . nortriptyline (PAMELOR) 50 MG capsule Take 1  capsule (50 mg total) by mouth at bedtime. 90 capsule 3  . pravastatin (PRAVACHOL) 40 MG tablet Take 1 tablet (40 mg total) by mouth at bedtime. 90 tablet 3  . sitaGLIPtin (JANUVIA) 100 MG tablet Take 1 tablet (100 mg total) by mouth daily. 90 tablet 1  . torsemide (DEMADEX) 20 MG tablet Take 1 tablet (20 mg total) by mouth daily. 90 tablet 3  . traMADol (ULTRAM) 50 MG tablet Take 1 tablet (50 mg total) by mouth every 6 (six) hours as needed. For pain 180 tablet 2   No current facility-administered medications for this encounter.    Filed Vitals:   12/15/14 1535  Weight: 258 lb 8 oz (117.255 kg)   PHYSICAL EXAM: General: Obese, Well appearing. No resp difficulty, mother present HEENT: normal  Neck: supple. JVP  cm. Carotids 2+ bilat; no bruits. No lymphadenopathy or thryomegaly appreciated.  Cor: PMI nondisplaced. Regular rate & rhythm. No S3. Lungs: clear  Abdomen: obese, soft, nontender, nondistended. No hepatosplenomegaly. No bruits or masses. Good bowel sounds.  Extremities: no cyanosis, clubbing, rash.  R and LLE 2-3+ No edema.  Neuro: alert & orientedx3, cranial nerves grossly intact. moves all 4 extremities w/o difficulty. Affect pleasant  ASSESSMENT & PLAN:  1) Chronic systolic HF: Ischemic cardiomyopathy with EF 30% on 7/15 echo--> ECHO 10/2014 EF 50-55% .  Now s/p Medtronic CRT-D.   NYHA class III symptoms Volume status elevated likely due to diet.   - Increase  torsemide 40 mg daily and add 2.5 mg metolazone daily for 2 days. .   - Continue current doses of Coreg 25 mg daily twice a day Continue  Hydralazine 50 mg tid/ /Imdur 60 mg daily.   - She has not been on ACEI or spironolactone due to severe hyperkalemia and CKD    - Cant afford corlanor. Marland Kitchen  2) CAD: s/p complex PTCA and stenting of LAD 07/2012. Set up stress test. - Continue statin, ASA and plavix 3) Hyperlipidemia: LDL not elevated, TGs quite high in 6/15. Followed by PCP.  4) PAD:  Difficult to palpate pedal  pulses but ABIs were normal in 8/15.  5) OSA: Moderate to severe OSA. Continue CPAP.    6) CKD: stage 3 (baseline Cr 1.7-1.8) -check labs today  Followup in 2 months.   CLEGG,AMY NP-C 12/15/2014   Patient seen and examined with Darrick Grinder, NP. We discussed all aspects of the encounter. I agree with the assessment and plan as stated above.   ICD interrogated personally in clinic. EF much improved with CRT-d but her volume status is up on exam and  by Leanne Lovely. Suspect due to dietary non-compliance. Agree with increasing torsemide to 40 daily and giving two days of metolazone. Extensive discussion of need for dietary restriction and sliding-scale diuretics. Given h/o of silent LAD infarct in past will order Myoview.   On ICD no AF or VT. Activity level very low. Encouraged to be more active.   Will check labs. Can consider rechallenge with ACE at some point but sturggled with hyperkalemia in past.   Benay Spice 1:54 AM

## 2014-12-15 NOTE — Patient Instructions (Addendum)
Labs today  Your physician has requested that you have a lexiscan myoview. For further information please visit [email or web address]. Please follow instruction sheet, as given.  Take 40 mg of Torsemide nightly  Take Metolazone 2.5 mg today and tomorrow TAKE 20 meQ of Potassium when you take Metolazone  Your physician recommends that you schedule a follow-up appointment in: 3 weeks  Do the following things EVERYDAY: 1) Weigh yourself in the morning before breakfast. Write it down and keep it in a log. 2) Take your medicines as prescribed 3) Eat low salt foods-Limit salt (sodium) to 2000 mg per day.  4) Stay as active as you can everyday 5) Limit all fluids for the day to less than 2 liters 6)

## 2014-12-15 NOTE — Telephone Encounter (Signed)
Pt called she was talking to [facility] earlier in the week, she was told to call back to give an update .Marland Kitchen She does not feel better she would like to be seen

## 2014-12-16 ENCOUNTER — Other Ambulatory Visit (HOSPITAL_COMMUNITY): Payer: Self-pay

## 2014-12-16 MED ORDER — TORSEMIDE 20 MG PO TABS: ORAL_TABLET | ORAL | Status: DC

## 2014-12-19 ENCOUNTER — Other Ambulatory Visit (HOSPITAL_COMMUNITY): Payer: Self-pay

## 2014-12-19 ENCOUNTER — Telehealth (HOSPITAL_COMMUNITY): Payer: Self-pay | Admitting: Vascular Surgery

## 2014-12-19 ENCOUNTER — Encounter (HOSPITAL_COMMUNITY): Payer: PPO

## 2014-12-19 MED ORDER — METOLAZONE 2.5 MG PO TABS: ORAL_TABLET | ORAL | Status: DC

## 2014-12-19 MED ORDER — TORSEMIDE 20 MG PO TABS: 40.0000 mg | ORAL_TABLET | Freq: Every day | ORAL | Status: DC

## 2014-12-19 NOTE — Telephone Encounter (Signed)
Pt called again today VERY upset she still have not gotten her fluid pill called in since last week , she wants someone to call her ASAP.Marland Kitchen Please advise

## 2014-12-19 NOTE — Telephone Encounter (Signed)
Rx was sent to her old primary pharmacy, she now uses a different pharmacy.  [place] set as primary and Rx for increased Torsemide and 2 day metolazone supply sent.  Patient aware and appreciative.  Renee Pain

## 2014-12-21 ENCOUNTER — Telehealth (HOSPITAL_COMMUNITY): Payer: Self-pay | Admitting: Vascular Surgery

## 2014-12-21 ENCOUNTER — Encounter (HOSPITAL_COMMUNITY): Payer: PPO

## 2014-12-21 NOTE — Telephone Encounter (Signed)
No changes to be made at this time.  Will follow up with alternate diuretic plan per Amy Clegg NP-C at her next appointment April 1st.  Renee Pain

## 2014-12-21 NOTE — Telephone Encounter (Signed)
Pt took the metolazone 1 Monday evening @6  and Tues @ 6 .Marland Kitchen Fluid isnot coming off.. She states she only went to the bathroom 3 times yesterday , now she has diarrea  .Marland Kitchen Please advise

## 2014-12-22 ENCOUNTER — Ambulatory Visit (HOSPITAL_COMMUNITY): Payer: PPO | Attending: Internal Medicine | Admitting: Radiology

## 2014-12-22 DIAGNOSIS — E119 Type 2 diabetes mellitus without complications: Secondary | ICD-10-CM | POA: Insufficient documentation

## 2014-12-22 DIAGNOSIS — R0602 Shortness of breath: Secondary | ICD-10-CM | POA: Diagnosis not present

## 2014-12-22 DIAGNOSIS — I10 Essential (primary) hypertension: Secondary | ICD-10-CM | POA: Insufficient documentation

## 2014-12-22 DIAGNOSIS — E669 Obesity, unspecified: Secondary | ICD-10-CM | POA: Diagnosis not present

## 2014-12-22 DIAGNOSIS — R0609 Other forms of dyspnea: Secondary | ICD-10-CM | POA: Diagnosis present

## 2014-12-22 DIAGNOSIS — I519 Heart disease, unspecified: Secondary | ICD-10-CM

## 2014-12-22 MED ORDER — TECHNETIUM TC 99M SESTAMIBI GENERIC - CARDIOLITE
30.0000 | Freq: Once | INTRAVENOUS | Status: AC | PRN
  Administered 2014-12-22: 30 via INTRAVENOUS

## 2014-12-22 MED ORDER — REGADENOSON 0.4 MG/5ML IV SOLN
0.4000 mg | Freq: Once | INTRAVENOUS | Status: AC
  Administered 2014-12-22: 0.4 mg via INTRAVENOUS

## 2014-12-22 NOTE — Progress Notes (Signed)
[name], [name] [id number] [phone]    Cardiology Nuclear Med Study  [name] is a 59 y.o. female     MRN : [id number]     DOB: 03-28-1956  Procedure Date: 12/22/2014  Nuclear Med Background Indication for Stress Test:  Evaluation for Ischemia History:  CAD, AICD Cardiac Risk Factors: Hypertension, NIDDM and Obesity  Symptoms:  DOE and SOB   Nuclear Pre-Procedure Caffeine/Decaff Intake:  None NPO After: 7:00pm   Lungs:  clear O2 Sat: 92% on room air. IV 0.9% NS with Angio Cath:  22g  IV Site: R Hand  IV Started by:  Crissie Figures, RN  Chest Size (in):  42 Cup Size: C  Height: 5\' 8"  (1.727 m)  Weight:  258 lb (117.028 kg)  BMI:  Body mass index is 39.24 kg/(m^2). Tech Comments:  n/a    Nuclear Med Study 1 or 2 day study: 2 day  Stress Test Type:  Lexiscan  Reading MD: Jenkins Rouge, MD  Order Authorizing Provider:  Glori Bickers, MD  Resting Radionuclide: Technetium 57m Sestamibi  Resting Radionuclide Dose: 33.0 mCi on 12/23/14   Stress Radionuclide:  Technetium 60m Sestamibi  Stress Radionuclide Dose: 33.0 mCi on 12/22/14           Stress Protocol Rest HR: 77 Stress HR: 83  Rest BP: 129/52 Stress BP: 116/47  Exercise Time (min): n/a METS: n/a   Predicted Max HR: 160 bpm % Max HR: 51.88 bpm Rate Pressure Product: 10209   Dose of Adenosine (mg):  n/a Dose of Lexiscan: 0.4 mg  Dose of Atropine (mg): n/a Dose of Dobutamine: n/a mcg/kg/min (at max HR)  Stress Test Technologist: Glade Lloyd, BS-ES  Nuclear Technologist:  Earl Many, CNMT     Rest Procedure:  Myocardial perfusion imaging was performed at rest 45 minutes following the intravenous administration of Technetium 11m Sestamibi. Rest ECG: NSR-LBBB  Stress Procedure:  The patient received IV Lexiscan 0.4 mg over 15-seconds.  Technetium 64m Sestamibi injected at 30-seconds.  Quantitative spect images were obtained after a 45 minute delay.   During the infusion of Lexiscan the patient complained of stomach tightness that resolved in recovery.  Stress ECG: No significant change from baseline ECG  QPS Raw Data Images:  Normal; no motion artifact; normal heart/lung ratio. Stress Images:  There is decreased uptake in the anterior wall. Rest Images:  There is decreased uptake in the anterior wall. Subtraction (SDS):  Mixed ischemia / infarct Transient Ischemic Dilatation (Normal <1.22):  0.89 Lung/Heart Ratio (Normal <0.45):  0.42  Quantitative Gated Spect Images QGS EDV:  156 ml QGS ESV:  78 ml  Impression Exercise Capacity:  Lexiscan with no exercise. BP Response:  Normal blood pressure response. Clinical Symptoms:  Stomach Pain ECG Impression:  No significant ST segment change suggestive of ischemia. Comparison with Prior Nuclear Study: No images to compare  Overall Impression:  Low risk stress nuclear study Small apical infarct with mild peri infarct ischemia.  LV Ejection Fraction: 50%.  LV Wall Motion:  Mildly decreased EF with apical hypokinesis  Jenkins Rouge

## 2014-12-23 ENCOUNTER — Ambulatory Visit (HOSPITAL_COMMUNITY): Payer: PPO | Attending: Cardiology

## 2014-12-23 DIAGNOSIS — R0989 Other specified symptoms and signs involving the circulatory and respiratory systems: Secondary | ICD-10-CM

## 2014-12-23 MED ORDER — TECHNETIUM TC 99M SESTAMIBI GENERIC - CARDIOLITE
33.0000 | Freq: Once | INTRAVENOUS | Status: AC | PRN
  Administered 2014-12-23: 33 via INTRAVENOUS

## 2014-12-27 ENCOUNTER — Other Ambulatory Visit: Payer: Self-pay

## 2014-12-27 DIAGNOSIS — Z1231 Encounter for screening mammogram for malignant neoplasm of breast: Secondary | ICD-10-CM

## 2014-12-30 ENCOUNTER — Other Ambulatory Visit: Payer: Self-pay | Admitting: Internal Medicine

## 2014-12-30 ENCOUNTER — Ambulatory Visit (INDEPENDENT_AMBULATORY_CARE_PROVIDER_SITE_OTHER): Payer: PPO | Admitting: Internal Medicine

## 2014-12-30 VITALS — BP 126/82 | HR 84 | Temp 97.9°F | Resp 16 | Ht 68.0 in | Wt 259.0 lb

## 2014-12-30 DIAGNOSIS — E11621 Type 2 diabetes mellitus with foot ulcer: Secondary | ICD-10-CM

## 2014-12-30 DIAGNOSIS — K140 Glossitis: Secondary | ICD-10-CM

## 2014-12-30 DIAGNOSIS — R29898 Other symptoms and signs involving the musculoskeletal system: Secondary | ICD-10-CM | POA: Diagnosis not present

## 2014-12-30 DIAGNOSIS — G4733 Obstructive sleep apnea (adult) (pediatric): Secondary | ICD-10-CM | POA: Diagnosis not present

## 2014-12-30 DIAGNOSIS — E1142 Type 2 diabetes mellitus with diabetic polyneuropathy: Secondary | ICD-10-CM | POA: Diagnosis not present

## 2014-12-30 DIAGNOSIS — L97529 Non-pressure chronic ulcer of other part of left foot with unspecified severity: Secondary | ICD-10-CM

## 2014-12-30 DIAGNOSIS — E1140 Type 2 diabetes mellitus with diabetic neuropathy, unspecified: Secondary | ICD-10-CM | POA: Diagnosis not present

## 2014-12-30 DIAGNOSIS — E1165 Type 2 diabetes mellitus with hyperglycemia: Secondary | ICD-10-CM | POA: Diagnosis not present

## 2014-12-30 DIAGNOSIS — L97509 Non-pressure chronic ulcer of other part of unspecified foot with unspecified severity: Secondary | ICD-10-CM

## 2014-12-30 DIAGNOSIS — N289 Disorder of kidney and ureter, unspecified: Secondary | ICD-10-CM | POA: Diagnosis not present

## 2014-12-30 DIAGNOSIS — IMO0002 Reserved for concepts with insufficient information to code with codable children: Secondary | ICD-10-CM

## 2014-12-30 DIAGNOSIS — G629 Polyneuropathy, unspecified: Secondary | ICD-10-CM | POA: Diagnosis not present

## 2014-12-30 LAB — POCT CBC
Granulocyte percent: 80.8 %G — AB (ref 37–80)
HEMATOCRIT: 29 % — AB (ref 37.7–47.9)
Hemoglobin: 8.7 g/dL — AB (ref 12.2–16.2)
LYMPH, POC: 0.6 (ref 0.6–3.4)
MCH, POC: 22.1 pg — AB (ref 27–31.2)
MCHC: 30.1 g/dL — AB (ref 31.8–35.4)
MCV: 73.5 fL — AB (ref 80–97)
MID (CBC): 0.1 (ref 0–0.9)
MPV: 6.2 fL (ref 0–99.8)
POC Granulocyte: 2.8 (ref 2–6.9)
POC LYMPH %: 16.8 % (ref 10–50)
POC MID %: 2.4 %M (ref 0–12)
Platelet Count, POC: 228 10*3/uL (ref 142–424)
RBC: 3.95 M/uL — AB (ref 4.04–5.48)
RDW, POC: 17.5 %
WBC: 3.5 10*3/uL — AB (ref 4.6–10.2)

## 2014-12-30 LAB — POCT GLYCOSYLATED HEMOGLOBIN (HGB A1C): HEMOGLOBIN A1C: 6.7

## 2014-12-30 LAB — POCT SKIN KOH: SKIN KOH, POC: NEGATIVE

## 2014-12-30 MED ORDER — FLUTICASONE PROPIONATE 50 MCG/ACT NA SUSP: NASAL | Status: DC

## 2014-12-30 NOTE — Progress Notes (Signed)
Subjective:  This chart was scribed for Vickie Sia, MD by Richarda Overlie, Medical scribe. This patient was seen in ROOM 2 and the patient's care was started 6:14 PM.   Patient ID: [name], female    DOB: 06/12/1956, 59 y.o.   MRN: [id number]   Chief Complaint  Patient presents with  . Leg Pain    has neuropathy   PCP Dr Nilda Simmer HPI HPI Comments: [name] is a 59 y.o. female with a history of neuropathy, CAD, CHF, HTN, DM and MI who presents to [facility] complaining of worsening neuropathy of her right leg for the last 2 weeks. Pt says she has had less problems the last 3-5 years until recently. She states that she feels a weakness in her right leg when trying to move in certain positions, but  does not feel unsteady when walking. Pt states that her right leg has been swelling more than usual--she has edema bilaterally from chronic systolic failure.  She says that her right leg does not aggravate her when she is sleeping. Pt reports that her right arm hurts and is weak when she raises it up to reach out for something as well. Pt reports her left leg is her "bad leg" and worsened after her back surgery. She denies back pain.  Onset diabetes early 90s. Radicular back pain 96,With lumbosacral disc surgery at L4-5 by Dr. Gerlene Fee and and decreased sensation left leg post surgery. She had several falls that year. Dr. Margaretmary Bayley care for her diabetes hyperlipidemia and hypertension for several years. Dr. love evaluated her weakness and paresthesias in the left leg beginning in 2011 because she had a series of falls where the leg apparently gave way. 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 Treated with IVIG. Physical therapy.Improved to the point of regaining  ambulation. She remained fairly stable until the recent year and has particularly gotten worse over the last month. Symptoms are bilateral. In addition she has a nonhealing sore on her left third toe on the dorsal aspect for about 6 weeks. This started as a blister. She has no feeling in that lower extremity.  CHF worse recently. See cardiology notes. Hypocalcemic  among other abnormal lab values but BNP normal on 12/15/2014  Pt states that she has had a sore throat for the last 2 months-associated post-nasal drip- has not altered her sensation of taste. She denies sneezing and heart burn     Patient Active Problem List   Diagnosis Date Noted  . OSA (obstructive sleep apnea) 08/15/2014  . Chronic left ventricular systolic dysfunction 05/05/2014  . Morbid obesity 10/26/2013  . Routine general medical examination at a health care facility 04/29/2013  . Diarrhea 04/29/2013  . Essential hypertension, benign 04/29/2013  . Renal insufficiency 04/19/2013  . Type II or unspecified type diabetes mellitus without mention of complication, not stated as uncontrolled 12/07/2012  . Pure hypercholesterolemia 12/07/2012  . Iron deficiency anemia, unspecified 12/07/2012  . Diabetic peripheral neuropathy associated with type 2 diabetes mellitus 12/07/2012  . Chronic systolic heart failure 07/29/2012  . Left bundle branch block 07/25/2012  . CAD (coronary artery disease) 07/25/2012  . Acute CHF (congestive heart failure) 07/21/2012  . Diabetes mellitus type 2 in obese 07/21/2012  . Hypoxia 07/21/2012  . HLD (hyperlipidemia) 07/21/2012  . Acute systolic heart failure  07/21/2012  . Acute myocardial infarction, subendocardial infarction, initial episode of care 07/21/2012   Current Outpatient Prescriptions on File Prior to Visit  Medication Sig Dispense Refill  . aspirin EC 81 MG tablet Take 1 tablet (81 mg total) by mouth daily. 90 tablet 3  . carvedilol (COREG) 25 MG tablet Take 1 tablet (25 mg total)  by mouth 2 (two) times daily with a meal. 180 tablet 3  . clopidogrel (PLAVIX) 75 MG tablet Take 1 tablet (75 mg total) by mouth daily. 90 tablet 3  . ferrous sulfate 325 (65 FE) MG tablet Take 325 mg by mouth 2 (two) times daily with a meal.    . gabapentin (NEURONTIN) 600 MG tablet Take 1 tablet (600 mg total) by mouth 3 (three) times daily. 270 tablet 3  . glimepiride (AMARYL) 4 MG tablet Take 1 tablet (4 mg total) by mouth 2 (two) times daily before a meal. 180 tablet 3  . glucose blood test strip Test blood sugar daily. Dx code: 250.00 100 each 4  . hydrALAZINE (APRESOLINE) 50 MG tablet Take 1 tablet (50 mg total) by mouth 3 (three) times daily. 270 tablet 3  . isosorbide mononitrate (IMDUR) 60 MG 24 hr tablet Take 1 tablet (60 mg total) by mouth daily. 90 tablet 3  . metFORMIN (GLUCOPHAGE) 1000 MG tablet Take 1,000 mg by mouth daily.    . nitroGLYCERIN (NITROSTAT) 0.4 MG SL tablet Place 1 tablet (0.4 mg total) under the tongue every 5 (five) minutes as needed for chest pain. 30 tablet 0  . nortriptyline (PAMELOR) 50 MG capsule Take 1 capsule (50 mg total) by mouth at bedtime. 90 capsule 3  . potassium chloride (K-DUR) 10 MEQ tablet Take 2 tablets (20 mEq total) by mouth daily. ONLY WHEN YOU TAKE A METOLAZONE, TAKE 20 MEQ 15 tablet 6  . pravastatin (PRAVACHOL) 40 MG tablet Take 1 tablet (40 mg total) by mouth at bedtime. 90 tablet 3  . sitaGLIPtin (JANUVIA) 100 MG tablet Take 1 tablet (100 mg total) by mouth daily. 90 tablet 1  . torsemide (DEMADEX) 20 MG tablet Take 2 tablets (40 mg total) by mouth daily. 60 tablet 3  . traMADol (ULTRAM) 50 MG tablet Take 1 tablet (50 mg total) by mouth every 6 (six) hours as needed. For pain 180 tablet 2  . doxycycline (VIBRAMYCIN) 100 MG capsule Take 1 capsule (100 mg total) by mouth 2 (two) times daily. (Patient not taking: Reported on 12/30/2014) 20 capsule 0  . metolazone (ZAROXOLYN) 2.5 MG tablet Take 1 tablet for two days then stop. (Patient not taking:  Reported on 12/30/2014) 2 tablet 0   No current facility-administered medications on file prior to visit.   No Known Allergies Past Surgical History  Procedure Laterality Date  . Lumbar disc surgery  1996    L4-5  . Incision and drainage abscess Right 2007    groin; with ICU stay due to sepsis.  . Bilateral oophorectomy  01/2011    ovarian cyst benign  . Bi-ventricular implantable cardioverter defibrillator  (crt-d)  07/06/2014  . Laparoscopic cholecystectomy  2011  . Back surgery    . Coronary angioplasty with stent placement Left 07/2012    new onset systolic CHF; elevated troponins.  Cardiac catheterization with stenting to LAD; EF 15%.  2D-echo: EF 20-25%.  . Vaginal hysterectomy  01/2011    Fibroids/DUB.  Ovaries removed. Font[place].  . Tubal ligation  1981  . Left heart catheterization with coronary angiogram N/A 07/21/2012  Procedure: LEFT HEART CATHETERIZATION WITH CORONARY ANGIOGRAM;  Surgeon: Dolores Patty, MD;  Location: [facility] CATH LAB;  Service: Cardiovascular;  Laterality: N/A;  . Percutaneous coronary stent intervention (pci-s) N/A 07/23/2012    Procedure: PERCUTANEOUS CORONARY STENT INTERVENTION (PCI-S);  Surgeon: Tonny Bollman, MD;  Location: [facility] C[facility] LAB;  Service: Cardiovascular;  Laterality: N/A;  . Bi-ventricular implantable cardioverter defibrillator N/A 07/06/2014    Procedure: BI-VENTRICULAR IMPLANTABLE CARDIOVERTER DEFIBRILLATOR  (CRT-D);  Surgeon: Duke Salvia, MD;  Location: [facility] C[facility] LAB;  Service: Cardiovascular;  Laterality: N/A;    Here with her 2 grown children (son 34)  Review of Systems  Constitutional: Positive for activity change and fatigue. Negative for fever and chills.  HENT: Positive for postnasal drip and sore throat.   Eyes: Negative for visual disturbance.  Respiratory: Negative for chest tightness and shortness of breath.   Cardiovascular: Positive for leg swelling. Negative for chest pain and palpitations.  Gastrointestinal: Negative  for abdominal pain.  Neurological: Positive for weakness. Negative for tremors and headaches.      Objective:   Physical Exam  Constitutional: She is oriented to person, place, and time. She appears well-developed and well-nourished. No distress.  No acute distress. In a wheelchair   HENT:  Head: Normocephalic and atraumatic.  The tongue has some lacy white coating as does the pharynx but without loss of inflammation. Clear rhinorrhea  Eyes: Conjunctivae and EOM are normal. Pupils are equal, round, and reactive to light.  Neck: Neck supple.  Cardiovascular: Normal rate.   Pulmonary/Chest: Effort normal.  Musculoskeletal:  She has weak grip bilaterally but FTO intact both sensory and motor. Weakness with arm elevation bilaterally but no shoulder pain. Shin push and toe up both weak. Absent sensation over both plantar areas. There is little feeling to palpation to the high calf area on the left. No discoloration. 3+ pitting edema over the lower extremities. Pulses faint. The left third toe has an area of redness surrounding a chronic nonhealing ulcerative lesion on the dorsal aspect. No pus is present.  Neurological: She is alert and oriented to person, place, and time. No cranial nerve deficit.  Psychiatric: She has a normal mood and affect. Her behavior is normal.  Nursing note and vitals reviewed.  she has great difficulty rising from her wheelchair on her own but is able to maintain stance once she is up and can ambulate.  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 especially with a history of a response to  IVIG  Weakness of both arms - this raises the question of more systemic problem or even a cervical myelopathy----obviously she is in need of neurology evaluation  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 2 diabetes mellitus--- will refer to wound care and [facility]  Renal insufficiency  OSA (obstructive sleep apnea)--awaiting CPAP  Pharyngitis - with negative KOH this is likely due to postnasal drainage  Recent exacerbation of congestive heart failure with peripheral edema  Awaiting CBC CMET TSH B12  45 min OV   Addendum labs=3/26 Results for orders placed or performed in  visit on 12/30/14  POCT glycosylated hemoglobin (Hb A1C)  Result Value Ref Range   Hemoglobin A1C 6.7   POCT Skin KOH  Result Value Ref Range   Skin KOH, POC Negative   POCT CBC  Result Value Ref Range   WBC 3.5 (A) 4.6 - 10.2 K/uL   Lymph, poc 0.6 0.6 - 3.4   POC LYMPH PERCENT 16.8 10 - 50 %L   MID (cbc) 0.1 0 - 0.9   POC MID % 2.4 0 - 12 %M   POC Granulocyte 2.8 2 - 6.9   Granulocyte percent 80.8 (A) 37 - 80 %G   RBC 3.95 (A) 4.04 - 5.48 M/uL   Hemoglobin 8.7 (A) 12.2 - 16.2 g/dL   HCT, POC 25.9 (A) 56.3 - 47.9 %   MCV 73.5 (A) 80 - 97 fL   MCH, POC 22.1 (A) 27 - 31.2 pg   MCHC 30.1 (A) 31.8 - 35.4 g/dL   RDW, POC 87.5 %   Platelet Count, POC 228 142 - 424 K/uL   MPV 6.2 0 - 99.8 fL   This microcytic anemia is obviously new and needs further workup//colonoscopy last ?2005 vs never. Was refused at physical 03/30/2014. Has had hemoglobins 10-11 in the past with normocytic indices Lab will add FE TIBC and heme sure.

## 2014-12-31 ENCOUNTER — Telehealth: Payer: Self-pay

## 2014-12-31 LAB — CBC WITH DIFFERENTIAL/PLATELET
BASOS ABS: 0 10*3/uL (ref 0.0–0.1)
BASOS PCT: 0 % (ref 0–1)
EOS PCT: 1 % (ref 0–5)
Eosinophils Absolute: 0 10*3/uL (ref 0.0–0.7)
HCT: 27.7 % — ABNORMAL LOW (ref 36.0–46.0)
Hemoglobin: 8.9 g/dL — ABNORMAL LOW (ref 12.0–15.0)
LYMPHS PCT: 15 % (ref 12–46)
Lymphs Abs: 0.5 10*3/uL — ABNORMAL LOW (ref 0.7–4.0)
MCH: 23.3 pg — AB (ref 26.0–34.0)
MCHC: 32.1 g/dL (ref 30.0–36.0)
MCV: 72.5 fL — ABNORMAL LOW (ref 78.0–100.0)
MONO ABS: 0.1 10*3/uL (ref 0.1–1.0)
MPV: 9.7 fL (ref 8.6–12.4)
Monocytes Relative: 3 % (ref 3–12)
Neutro Abs: 2.6 10*3/uL (ref 1.7–7.7)
Neutrophils Relative %: 81 % — ABNORMAL HIGH (ref 43–77)
Platelets: 182 10*3/uL (ref 150–400)
RBC: 3.82 MIL/uL — AB (ref 3.87–5.11)
RDW: 16.3 % — ABNORMAL HIGH (ref 11.5–15.5)
WBC: 3.2 10*3/uL — ABNORMAL LOW (ref 4.0–10.5)

## 2014-12-31 LAB — COMPREHENSIVE METABOLIC PANEL
ALBUMIN: 2.3 g/dL — AB (ref 3.5–5.2)
AST: 19 U/L (ref 0–37)
Alkaline Phosphatase: 75 U/L (ref 39–117)
BUN: 27 mg/dL — ABNORMAL HIGH (ref 6–23)
CALCIUM: 8.1 mg/dL — AB (ref 8.4–10.5)
CO2: 26 meq/L (ref 19–32)
Chloride: 94 mEq/L — ABNORMAL LOW (ref 96–112)
Creat: 1.11 mg/dL — ABNORMAL HIGH (ref 0.50–1.10)
Glucose, Bld: 67 mg/dL — ABNORMAL LOW (ref 70–99)
Potassium: 4 mEq/L (ref 3.5–5.3)
SODIUM: 132 meq/L — AB (ref 135–145)
Total Bilirubin: 0.4 mg/dL (ref 0.2–1.2)
Total Protein: 6.1 g/dL (ref 6.0–8.3)

## 2014-12-31 LAB — TSH: TSH: 2.072 u[IU]/mL (ref 0.350–4.500)

## 2014-12-31 LAB — VITAMIN B12: VITAMIN B 12: 479 pg/mL (ref 211–911)

## 2014-12-31 NOTE — Telephone Encounter (Signed)
Left message on machine to call back  

## 2014-12-31 NOTE — Telephone Encounter (Signed)
-----   Message from Leandrew Koyanagi, MD sent at 12/30/2014  7:37 PM EDT ----- Call sat---tell her throat pain due to allergic post nasal drip--try flonase as sent to drug store for at least one month

## 2015-01-02 LAB — IRON AND TIBC
%SAT: 6 % — ABNORMAL LOW (ref 20–55)
Iron: 13 ug/dL — ABNORMAL LOW (ref 42–145)
TIBC: 201 ug/dL — ABNORMAL LOW (ref 250–470)
UIBC: 188 ug/dL (ref 125–400)

## 2015-01-02 NOTE — Telephone Encounter (Signed)
Left detailed message on machine letting pt know.

## 2015-01-05 ENCOUNTER — Encounter: Payer: Self-pay | Admitting: Internal Medicine

## 2015-01-05 NOTE — Addendum Note (Signed)
Addended by: Leandrew Koyanagi on: 01/05/2015 06:11 PM   Modules accepted: Orders

## 2015-01-06 ENCOUNTER — Ambulatory Visit (HOSPITAL_COMMUNITY)
Admission: RE | Admit: 2015-01-06 | Discharge: 2015-01-06 | Disposition: A | Discharge status: Home / Self Care | Payer: PPO | Source: Ambulatory Visit | Attending: Cardiology | Admitting: Cardiology

## 2015-01-06 VITALS — BP 100/42 | HR 79 | Wt 260.4 lb

## 2015-01-06 DIAGNOSIS — E780 Pure hypercholesterolemia: Secondary | ICD-10-CM | POA: Diagnosis not present

## 2015-01-06 DIAGNOSIS — D509 Iron deficiency anemia, unspecified: Secondary | ICD-10-CM | POA: Insufficient documentation

## 2015-01-06 DIAGNOSIS — G629 Polyneuropathy, unspecified: Secondary | ICD-10-CM

## 2015-01-06 DIAGNOSIS — Z79899 Other long term (current) drug therapy: Secondary | ICD-10-CM | POA: Insufficient documentation

## 2015-01-06 DIAGNOSIS — I519 Heart disease, unspecified: Secondary | ICD-10-CM | POA: Diagnosis not present

## 2015-01-06 DIAGNOSIS — I2510 Atherosclerotic heart disease of native coronary artery without angina pectoris: Secondary | ICD-10-CM | POA: Insufficient documentation

## 2015-01-06 DIAGNOSIS — Z7902 Long term (current) use of antithrombotics/antiplatelets: Secondary | ICD-10-CM | POA: Insufficient documentation

## 2015-01-06 DIAGNOSIS — I5022 Chronic systolic (congestive) heart failure: Secondary | ICD-10-CM | POA: Diagnosis present

## 2015-01-06 DIAGNOSIS — E1142 Type 2 diabetes mellitus with diabetic polyneuropathy: Secondary | ICD-10-CM | POA: Diagnosis not present

## 2015-01-06 DIAGNOSIS — Z7982 Long term (current) use of aspirin: Secondary | ICD-10-CM | POA: Diagnosis not present

## 2015-01-06 DIAGNOSIS — I252 Old myocardial infarction: Secondary | ICD-10-CM | POA: Insufficient documentation

## 2015-01-06 DIAGNOSIS — I10 Essential (primary) hypertension: Secondary | ICD-10-CM | POA: Insufficient documentation

## 2015-01-06 DIAGNOSIS — G8929 Other chronic pain: Secondary | ICD-10-CM | POA: Insufficient documentation

## 2015-01-06 DIAGNOSIS — I739 Peripheral vascular disease, unspecified: Secondary | ICD-10-CM | POA: Diagnosis not present

## 2015-01-06 DIAGNOSIS — Z95810 Presence of automatic (implantable) cardiac defibrillator: Secondary | ICD-10-CM | POA: Insufficient documentation

## 2015-01-06 DIAGNOSIS — G4733 Obstructive sleep apnea (adult) (pediatric): Secondary | ICD-10-CM | POA: Insufficient documentation

## 2015-01-06 NOTE — Patient Instructions (Signed)
Follow up 6 weeks.  Do the following things EVERYDAY: 1) Weigh yourself in the morning before breakfast. Write it down and keep it in a log. 2) Take your medicines as prescribed 3) Eat low salt foods-Limit salt (sodium) to 2000 mg per day.  4) Stay as active as you can everyday 5) Limit all fluids for the day to less than 2 liters

## 2015-01-07 NOTE — Progress Notes (Signed)
Patient ID: [name], female   DOB: [birth year], 59 y.o.   MRN: [id number]   Weight Range   275 pounds  Baseline proBNP   5000.6    HPI: [name] is a 58 y.o. female with past medical history of HL, morbid obesity, chronic pain and DM since [name] was 59 years old. [name] had neuropathy in 2011 causing [name] to be in a wheelchair, this recovered after 8 months of therapy.  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    Admitted to [facility] 10/13 with progressive dyspnea and lower extremity edema.  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  Underwent complex PTCA and stenting of LAD by Dr. Burt Knack.  [name] was placed on Brilinta and ASA for 1 year, now on Plavix and ASA.    [name] has been unable to tolerate spironolactone or lisinopril due to hyperkalemia.  After last appointment, [name] was set up for [facility], this showed EF 50% and a small apical infarct (low risk study).  For the last 2 weeks, [name] says that both legs have become more weak.  [name] has been using [name] wheelchair more.  [name] is unable to get up steps.  It is very difficult to walk and [name] is using [name] walker.  [name] arms are also weaker.  [name] has some dyspnea but the weakness is more prominent. [name] says that it is  similar to 2011 when [name] had the severe sensorimotor polyneuropathy symptoms.  [name] is also anemic, HCT 27.8 when recently checked with profound iron deficiency.  No melena or BRBPR.  BNP was normal recently.    Optivol: Fluid index < threshold, stable thoracic impedance, very little activity.   ECHO 10/29/12 EF 35-40%  ECHO 03/11/13 EF 30% ECHO 7/15 EF 30%, inferior akinesis with global hypokinesis, mild MR, normal RV.   Peripheral arterial dopplers 8/15 with normal ABIs bilaterally Sleep study 11/15 with severe OSA Echo (1/16) with EF 50-55% Lexiscan Cardiolite in 3/16 with EF 50%,  small apical infarct with mild peri-infarct ischemia, low risk study.    Medtronic CRT-D implantation 9/15.   Labs (6/15): LDL 19, TGs 390 Labs (8/15): K 4.9, creatinine 1.27 Labs (10/15): K 5.2, creatinine 1.7, HCT 31.5 Labs (3/16): HCT 27.8, transferrin saturation 6%, TSH normal, K 4, creatinine 1.11, BNP 78  ROS: All systems negative except as listed in HPI, PMH and Problem List.  Past Medical History  Diagnosis Date  . Obesity, unspecified   . Unspecified vitamin D deficiency   . Pure hypercholesterolemia   . Neuropathy 2011  . Chicken pox   . Coronary artery disease   . CHF (congestive heart failure) 07/19/2012    EF 15% by cardiac catheterization.  Admission.  . Hypertension   . Anemia   . Automatic implantable cardioverter-defibrillator in situ   . Type II diabetes mellitus   . Myocardial infarct 07/19/12    s/p cardiac catheterization with stenting to LAD.  Marland Kitchen History of blood transfusion ~ 2011    "plasma; had neuropathy; couldn't walk"  . OSA on CPAP     Moderate with AHI 23/hr and now on CPAP at 16cm H2O.  [name] DME is [facility]    Current Outpatient Prescriptions  Medication Sig Dispense Refill  . aspirin EC 81 MG tablet Take 1 tablet (81 mg total) by mouth daily. 90 tablet 3  . carvedilol (COREG)  25 MG tablet Take 1 tablet (25 mg total) by mouth 2 (two) times daily with a meal. 180 tablet 3  . clopidogrel (PLAVIX) 75 MG tablet Take 1 tablet (75 mg total) by mouth daily. 90 tablet 3  . fluticasone (FLONASE) 50 MCG/ACT nasal spray 1 spray each nostril twice a day 16 g 6  . gabapentin (NEURONTIN) 600 MG tablet Take 1 tablet (600 mg total) by mouth 3 (three) times daily. 270 tablet 3  . glimepiride (AMARYL) 4 MG tablet Take 1 tablet (4 mg total) by mouth 2 (two) times daily before a meal. 180 tablet 3  . glucose blood test strip Test blood sugar daily. Dx code: 250.00 100 each 4  . hydrALAZINE (APRESOLINE) 50 MG tablet Take 1 tablet (50 mg total) by mouth 3 (three) times  daily. 270 tablet 3  . isosorbide mononitrate (IMDUR) 60 MG 24 hr tablet Take 1 tablet (60 mg total) by mouth daily. 90 tablet 3  . metFORMIN (GLUCOPHAGE) 1000 MG tablet Take 1,000 mg by mouth daily.    . metolazone (ZAROXOLYN) 2.5 MG tablet Take 1 tablet for two days then stop. 2 tablet 0  . nitroGLYCERIN (NITROSTAT) 0.4 MG SL tablet Place 1 tablet (0.4 mg total) under the tongue every 5 (five) minutes as needed for chest pain. 30 tablet 0  . nortriptyline (PAMELOR) 50 MG capsule Take 1 capsule (50 mg total) by mouth at bedtime. 90 capsule 3  . potassium chloride (K-DUR) 10 MEQ tablet Take 2 tablets (20 mEq total) by mouth daily. ONLY WHEN YOU TAKE A METOLAZONE, TAKE 20 MEQ 15 tablet 6  . pravastatin (PRAVACHOL) 40 MG tablet Take 1 tablet (40 mg total) by mouth at bedtime. 90 tablet 3  . sitaGLIPtin (JANUVIA) 100 MG tablet Take 1 tablet (100 mg total) by mouth daily. 90 tablet 1  . torsemide (DEMADEX) 20 MG tablet Take 2 tablets (40 mg total) by mouth daily. 60 tablet 3  . traMADol (ULTRAM) 50 MG tablet Take 1 tablet (50 mg total) by mouth every 6 (six) hours as needed. For pain 180 tablet 2  . ferrous sulfate 325 (65 FE) MG tablet Take 325 mg by mouth 2 (two) times daily with a meal.     No current facility-administered medications for this encounter.    Filed Vitals:   01/06/15 0941  BP: 100/42  Pulse: 79  Weight: 260 lb 6.4 oz (118.117 kg)  SpO2: 94%   PHYSICAL EXAM: General: Obese, no resp difficulty. HEENT: normal  Neck: supple. JVP not elevated. Carotids 2+ bilat; no bruits. No lymphadenopathy or thryomegaly appreciated.  Cor: PMI nondisplaced. Regular rate & rhythm. No S3. Lungs: Slight crackles at bases.   Abdomen: obese, soft, nontender, nondistended. No hepatosplenomegaly. No bruits or masses. Good bowel sounds.  Extremities: no cyanosis, clubbing, rash.  1+ edema 1/2 up lower legs bilaterally.  Neuro: alert & orientedx3  ASSESSMENT & PLAN:  1) Chronic systolic HF:  Ischemic cardiomyopathy with EF 30% on 7/15 echo--> ECHO 10/2014 EF 50-55% .  Now s/p Medtronic CRT-D.   NYHA class IV symptoms currently, but I do not think that [name] symptoms are due primarily to volume overload/CHF.  BNP was normal recently, and exam and Optivol do not suggest volume overload.  I think that [name] symptoms are due to anemia and also possibly due to worsening of [name] sensorimotor polyneuropathy (profound weakness).   - Continue current torsemide dosing, would not increase.  [name] can use compression stockings for [name] residual  peripheral edema.  - Continue current doses of Coreg 25 mg daily twice a day - Continue  Hydralazine 50 mg tid/ /Imdur 60 mg daily.   - [name] has not been on ACEI or spironolactone due to severe hyperkalemia and CKD    2) CAD: S/p complex LAD PCI 07/2012. Lexiscan Cardiolite in 3/16 was low risk.  No recent chest pain.  - Continue statin, ASA and plavix 3) Hyperlipidemia: LDL not elevated, TGs quite high in 6/15. Followed by PCP.  4) PAD:  Difficult to palpate pedal pulses but ABIs were normal in 8/15.  5) OSA: Moderate to severe OSA. Continue CPAP.    6) CKD: Most recent creatinine was improved.  7) Anemia: Iron deficiency anemia, most recent HCT 27.8.  PCP is working this up, to do hemoccult cards. Suspect that this contributes to dyspnea. IV iron may be helpful.  8) Weakness: I think that [name] may be having a worsening of [name] sensorimotor polyneuropathy.  [name] has become significantly more weak over the last 2 weeks.  I do not think this is heart failure, I am concerned that [name] symptoms are primarily neurological, especially given [name] past history of severe neuropathy requiring IVIG.   - I will work on facilitating a neurology appointment.    [name] 01/07/2015

## 2015-01-10 ENCOUNTER — Ambulatory Visit
Admit: 2015-01-10 | Disposition: A | Discharge status: Home / Self Care | Payer: Self-pay | Attending: Surgery | Admitting: Surgery

## 2015-01-10 ENCOUNTER — Telehealth: Payer: Self-pay

## 2015-01-10 ENCOUNTER — Encounter
Admit: 2015-01-10 | Disposition: A | Discharge status: Home / Self Care | Payer: Self-pay | Attending: Surgery | Admitting: Surgery

## 2015-01-10 DIAGNOSIS — E1165 Type 2 diabetes mellitus with hyperglycemia: Secondary | ICD-10-CM

## 2015-01-10 DIAGNOSIS — IMO0002 Reserved for concepts with insufficient information to code with codable children: Secondary | ICD-10-CM

## 2015-01-10 MED ORDER — SITAGLIPTIN PHOSPHATE 100 MG PO TABS: 100.0000 mg | ORAL_TABLET | Freq: Every day | ORAL | Status: DC

## 2015-01-10 MED ORDER — METFORMIN HCL 1000 MG PO TABS: 1000.0000 mg | ORAL_TABLET | Freq: Every day | ORAL | Status: DC

## 2015-01-10 NOTE — Telephone Encounter (Signed)
[place] [facility] called to req RF on meds they have not filled before, so could not send another way. She said req was for Janumet and metformin, but I don't see anything in EPIC about any h/o pt taking Janumet. Pt is taking Januvia currently. Pharm stated that must be what pt had asked for.

## 2015-01-16 ENCOUNTER — Encounter: Payer: Self-pay | Admitting: Family Medicine

## 2015-01-16 ENCOUNTER — Ambulatory Visit (INDEPENDENT_AMBULATORY_CARE_PROVIDER_SITE_OTHER): Payer: PPO

## 2015-01-16 ENCOUNTER — Ambulatory Visit (INDEPENDENT_AMBULATORY_CARE_PROVIDER_SITE_OTHER): Payer: PPO | Admitting: Family Medicine

## 2015-01-16 VITALS — BP 120/50 | HR 77 | Temp 97.8°F | Resp 16 | Ht 68.0 in | Wt 256.6 lb

## 2015-01-16 DIAGNOSIS — R0781 Pleurodynia: Secondary | ICD-10-CM | POA: Diagnosis not present

## 2015-01-16 DIAGNOSIS — E1142 Type 2 diabetes mellitus with diabetic polyneuropathy: Secondary | ICD-10-CM

## 2015-01-16 DIAGNOSIS — G4733 Obstructive sleep apnea (adult) (pediatric): Secondary | ICD-10-CM | POA: Diagnosis not present

## 2015-01-16 DIAGNOSIS — R509 Fever, unspecified: Secondary | ICD-10-CM | POA: Diagnosis not present

## 2015-01-16 DIAGNOSIS — E11621 Type 2 diabetes mellitus with foot ulcer: Secondary | ICD-10-CM

## 2015-01-16 DIAGNOSIS — I519 Heart disease, unspecified: Secondary | ICD-10-CM | POA: Diagnosis not present

## 2015-01-16 DIAGNOSIS — I2510 Atherosclerotic heart disease of native coronary artery without angina pectoris: Secondary | ICD-10-CM

## 2015-01-16 DIAGNOSIS — G629 Polyneuropathy, unspecified: Secondary | ICD-10-CM

## 2015-01-16 DIAGNOSIS — K921 Melena: Secondary | ICD-10-CM | POA: Diagnosis not present

## 2015-01-16 DIAGNOSIS — Z1211 Encounter for screening for malignant neoplasm of colon: Secondary | ICD-10-CM

## 2015-01-16 DIAGNOSIS — D509 Iron deficiency anemia, unspecified: Secondary | ICD-10-CM

## 2015-01-16 DIAGNOSIS — L97529 Non-pressure chronic ulcer of other part of left foot with unspecified severity: Secondary | ICD-10-CM | POA: Diagnosis not present

## 2015-01-16 LAB — POCT URINALYSIS DIPSTICK
Bilirubin, UA: NEGATIVE
GLUCOSE UA: NEGATIVE
Ketones, UA: NEGATIVE
Nitrite, UA: NEGATIVE
RBC UA: NEGATIVE
Spec Grav, UA: <=1.005
UROBILINOGEN UA: 0.2
pH, UA: 5

## 2015-01-16 LAB — POCT CBC
GRANULOCYTE PERCENT: 82.1 % — AB (ref 37–80)
HCT, POC: 28.6 % — AB (ref 37.7–47.9)
Hemoglobin: 8.9 g/dL — AB (ref 12.2–16.2)
Lymph, poc: 0.4 — AB (ref 0.6–3.4)
MCH, POC: 22.1 pg — AB (ref 27–31.2)
MCHC: 31.3 g/dL — AB (ref 31.8–35.4)
MCV: 70.6 fL — AB (ref 80–97)
MID (CBC): 0.2 (ref 0–0.9)
MPV: 6.6 fL (ref 0–99.8)
PLATELET COUNT, POC: 346 10*3/uL (ref 142–424)
POC Granulocyte: 2.7 (ref 2–6.9)
POC LYMPH PERCENT: 13.3 %L (ref 10–50)
POC MID %: 4.6 % (ref 0–12)
RBC: 40.05 M/uL — AB (ref 4.04–5.48)
RDW, POC: 17.9 %
WBC: 3.3 10*3/uL — AB (ref 4.6–10.2)

## 2015-01-16 LAB — POCT UA - MICROSCOPIC ONLY
Casts, Ur, LPF, POC: NEGATIVE
Crystals, Ur, HPF, POC: NEGATIVE
Mucus, UA: NEGATIVE
YEAST UA: NEGATIVE

## 2015-01-16 LAB — WOUND AEROBIC CULTURE

## 2015-01-16 MED ORDER — DOXYCYCLINE HYCLATE 100 MG PO CAPS: 100.0000 mg | ORAL_CAPSULE | Freq: Two times a day (BID) | ORAL | Status: DC

## 2015-01-16 MED ORDER — LEVOFLOXACIN 500 MG PO TABS: 500.0000 mg | ORAL_TABLET | Freq: Every day | ORAL | Status: DC

## 2015-01-16 MED ORDER — TRAMADOL HCL 50 MG PO TABS: 50.0000 mg | ORAL_TABLET | Freq: Four times a day (QID) | ORAL | Status: DC | PRN

## 2015-01-16 NOTE — Progress Notes (Signed)
Subjective:    Patient ID: [name], female    DOB: 01-06-1956, 59 y.o.   MRN: [id number]  01/16/2015  Medication Refill; Anemia; and Peripheral Neuropathy   HPI This 59 y.o. female presents for six month follow-up:  1.  DMII with Peripheral neuropathy: did not ever receive call to schedule appointment after visit in 07/2014; pt did not think about scheduling 3 month follow-up appointment until now.   Started having R leg weakness three weeks ago in "my good R leg".  Started to notice that unable to lift up R leg on step.  Went to see Dr. Shirlee Latch of cardiology who placed referral to [facility]; appointment with Neurology on 02/01/15.  Dr. Shirlee Latch called to bump up appointment sooner.  Saw Dr. Merla Riches at walk in clinic two weeks ago.  S/p extensive labs including TSH, B12, HgbA1c, CMET, CBC. Went on cruise in February; [place] and [place].  Happy to get away.  Mild R arm weakness onset same time.  No slurred speech or difficulties walking.  No numbness or tingling in face.  No falls.  HgbA1c 6.7 on 12/30/14.  No worsening in n/t in legs.  No b/b dysfunction.  No leaking/incontinence.  Unable to get out of chair alone.  Dr. Sandria Manly was previous neurologist for peripheral neuropathy; last visit 2011 with Dr. Sandria Manly; has previously been wheelchair bound due to peripheral neuropathy but has not needed assistant devices since 2011.  Has been on walker for past two weeks since visit with Dr. Merla Riches. Husband having to provide most care at home.  Husband having cataract surgery in one week; will not be able to bend for one week after surgery.  2.  Anemia iron deficiency: Hemoglobin of 8.7 two weeks ago when saw Dr. Merla Riches at walk in clinic.   Sent in stool cards.  No bloody stools; no black stools.  No change in bowel habits.  Had restarted iron after 07/2014 visit for Hemoglobin of 10.6; then ran out 6 weeks ago and did not restart it.  Now taking ferrous sulfate 325mg  bid.   Tolerating bid dosing without difficulties.    3.  OSA: patient has refused sleep study for several past years but agreed when Dr. Shirlee Latch insisted on sleep study; has severe sleep apnea.  Started CPAP; Dr. Shirlee Latch referred for sleep study; compliance with CPAP.  Now has a large scab on nose.  Thinks that CPAP machine does not fit well on face.  Plans to go to [facility] agency for mask fitting tomorrow after wound clinic visit.   4.  Allergic Rhinitis: was having horrible PND; Dr. Merla Riches called in nasal spray; does not like nasal sprays so did not start using.  Not using Flonase.  Mild nasal congestion.  Sore throat has improved.  ST was occurring at night only.    5.  Low grade fever and toe ulcer and heel ulcer: intermittent fever for past three months; average fever at night of 101.9; onset months ago.  No abx currently.  Going to wound center weekly.  First visit last week with wound center.  Dr. Merla Riches sent to [facility] two weeks ago about toe ulceration.  Now, also has hole in foot as well;family member removed skin off of heel and found a huge hole in foot.    Appointment tomorrow at 2:00pm with wound center.  Fever for three months intermittently.  Will have fever qod.  For the last month has been every night.  Chills  every night.  L foot xray by wound center last week; not sure of results.  Has not undergone MRI foot yet. Did not discuss fever with wound center physician.  Forgot to mention.  Denies dysuria, urgency, frequency; denies cough or SOB.    6.  CHF: cardiology increased Torsemide to 40mg  two tablets daily two weeks ago due to worsening swelling. Swelling has worsened since last visit with cardiology despite increasing Torsemide dose.  Swelling was just L foot swelling; now R foot is swollen.  Weight is down four pounds in ten days.    7.  Chest pain with coughing or sneezing: has been suffering with band like pain in anterior chest B with coughing or sneezing.  No sputum  production. No pain with deep inspiration; no orthopnea or SOB.  Has discussed with other providers; no one can advise patient of etiology.  Review of Systems  Constitutional: Positive for fever, chills and activity change. Negative for diaphoresis and fatigue.  HENT: Positive for congestion and postnasal drip. Negative for rhinorrhea and sore throat.   Eyes: Negative for visual disturbance.  Respiratory: Negative for cough, shortness of breath, wheezing and stridor.   Cardiovascular: Positive for leg swelling. Negative for chest pain and palpitations.  Gastrointestinal: Positive for constipation. Negative for nausea, vomiting, abdominal pain, diarrhea, blood in stool, abdominal distention, anal bleeding and rectal pain.  Endocrine: Negative for cold intolerance, heat intolerance, polydipsia, polyphagia and polyuria.  Genitourinary: Negative for dysuria, urgency and frequency.  Skin: Positive for color change and wound.  Neurological: Positive for weakness and numbness. Negative for dizziness, tremors, seizures, syncope, facial asymmetry, speech difficulty, light-headedness and headaches.    Past Medical History  Diagnosis Date  . Obesity, unspecified   . Unspecified vitamin D deficiency   . Pure hypercholesterolemia   . Neuropathy 2011  . Chicken pox   . Coronary artery disease   . CHF (congestive heart failure) 07/19/2012    EF 15% by cardiac catheterization.  Admission.  . Hypertension   . Anemia   . Automatic implantable cardioverter-defibrillator in situ   . Type II diabetes mellitus   . Myocardial infarct 07/19/12    s/p cardiac catheterization with stenting to LAD.  Marland Kitchen History of blood transfusion ~ 2011    "plasma; had neuropathy; couldn't walk"  . OSA on CPAP     Moderate with AHI 23/hr and now on CPAP at 16cm H2O.  Her DME is [facility]   Past Surgical History  Procedure Laterality Date  . Lumbar disc surgery  1996    L4-5  . Incision and drainage abscess Right 2007     groin; with ICU stay due to sepsis.  . Bilateral oophorectomy  01/2011    ovarian cyst benign  . Bi-ventricular implantable cardioverter defibrillator  (crt-d)  07/06/2014  . Laparoscopic cholecystectomy  2011  . Back surgery    . Coronary angioplasty with stent placement Left 07/2012    new onset systolic CHF; elevated troponins.  Cardiac catheterization with stenting to LAD; EF 15%.  2D-echo: EF 20-25%.  . Vaginal hysterectomy  01/2011    Fibroids/DUB.  Ovaries removed. [place].  . Tubal ligation  1981  . Left heart catheterization with coronary angiogram N/A 07/21/2012    Procedure: LEFT HEART CATHETERIZATION WITH CORONARY ANGIOGRAM;  Surgeon: Dolores Patty, MD;  Location: [facility] CATH LAB;  Service: Cardiovascular;  Laterality: N/A;  . Percutaneous coronary stent intervention (pci-s) N/A 07/23/2012    Procedure: PERCUTANEOUS CORONARY STENT INTERVENTION (PCI-S);  Surgeon: Tonny Bollman, MD;  Location: [place] CATH LAB;  Service: Cardiovascular;  Laterality: N/A;  . Bi-ventricular implantable cardioverter defibrillator N/A 07/06/2014    Procedure: BI-VENTRICULAR IMPLANTABLE CARDIOVERTER DEFIBRILLATOR  (CRT-D);  Surgeon: Duke Salvia, MD;  Location: [facility] CATH LAB;  Service: Cardiovascular;  Laterality: N/A;   No Known Allergies      Objective:    BP 120/50 mmHg  Pulse 77  Temp(Src) 97.8 F (36.6 C) (Oral)  Resp 16  Ht 5\' 8"  (1.727 m)  Wt 256 lb 9.6 oz (116.393 kg)  BMI 39.02 kg/m2  SpO2 96% Physical Exam  Constitutional: She is oriented to person, place, and time. She appears well-developed and well-nourished. No distress.  HENT:  Head: Normocephalic and atraumatic.    Right Ear: External ear normal.  Left Ear: External ear normal.  Nose: Nose normal.  Mouth/Throat: Oropharynx is clear and moist.  Large eschar at nasal bridge with surrounding erythema of 5 mm.  Eyes: Conjunctivae and EOM are normal. Pupils are equal, round, and reactive to light.  Neck: Normal range of  motion. Neck supple. Carotid bruit is not present. No thyromegaly present.  Cardiovascular: Normal rate, regular rhythm, normal heart sounds and intact distal pulses.  Exam reveals no gallop and no friction rub.   No murmur heard. 2+ pitting edema to proximal calves B; 1-2+ pitting edema to B wrists.    Pulmonary/Chest: Effort normal and breath sounds normal. She has no wheezes. She has no rales.  Abdominal: Soft. Bowel sounds are normal. She exhibits no distension and no mass. There is no tenderness. There is no rebound and no guarding.  Musculoskeletal: She exhibits edema.       Feet:  L lateral heel with 3cm x 2 cm open wound with mild odor and scant drainage; no fluctuants.  L second toe with ulcer without surrounding erythema; no active drainage.  Lymphadenopathy:    She has no cervical adenopathy.  Neurological: She is alert and oriented to person, place, and time. No cranial nerve deficit.  Requires assistance going from supine to sitting and from sitting to standing; walks slowly with walker. Motor 4.5/5 RUE; motor 3-4/5 RLE.  Skin: Skin is warm and dry. No rash noted. She is not diaphoretic. No erythema. No pallor.  Psychiatric: She has a normal mood and affect. Her behavior is normal.    [facility] reading (PRIMARY) by  Dr. Katrinka Blazing. CXR: NAD; no infiltrate; no pulmonary edema.      Assessment & Plan:   1. 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 2 diabetes mellitus   2. Other specified fever   3. Chronic left ventricular systolic dysfunction   4. Anemia, iron deficiency   5. Type 2 diabetes mellitus with diabetic polyneuropathy   6. Pleuritic chest pain   7. Diabetic peripheral neuropathy associated with type 2 diabetes mellitus   8. OSA (obstructive sleep apnea)   9. Coronary artery disease involving native coronary artery of native heart without angina pectoris     1.  Iron deficiency anemia: worsening; anemia unchanged from two weeks ago; increase ferrous sulfate to  tid.  Awaiting hemosure results that were completed last week. 2.  Diabetic L foot ulcer:  New.  L heel very concerning for active infection; rx for Levaquin and Doxycycline provided.  Managed by wound center at [facility].   Warrants MRI of foot due to fevers; pt declined MRI to be ordered today; wants to discuss with wound center provider.  Will contact wound center  provider regarding management. Concern for osteomyelitis due to fevers for three months.  3. Fever:  New. Onset three months ago.  Obtain CBC.  CXR negative for infiltrate; obtain urine culture.  Fever likely coming from L foot wound; concern for osteomyelitis.  Start Levaquin and Doxycycline for wound L foot.   4.  Chest wall pain: New.  Occurs with laughing or sneezing but not with deep breathing or exertion; CXR WNL; consistent with chest wall strain.  Using upper body strength to change positions. 5.  Diabetic peripheral neuropathy: with recent worsening RLE weakness and mild RUE weakness.  Onset three weeks ago.  Appointment with neurology in upcoming two weeks.  Warrants MRI brain to rule out CVA. 6. OSA on CPAP: New.  Finally agreed to sleep study from cardiology.   Compliance with CPAP but suffering with wound on nasal bridge from mask. 7.  CHF systolic: with volume overload today; worsening since cardiology visit two weeks ago and worsening despite increasing Torsemide to 40mg  two daily.  Will contact cardiology regarding volume overload.  Recent echo with EF 50% in 1/16; cardiolite 3/16 with EF 50%. Cardiology recently felt that symptoms not due to ovlume overload but due to anemia and due to decreased activity from PN.   Meds ordered this encounter  Medications  . doxycycline (VIBRAMYCIN) 100 MG capsule    Sig: Take 1 capsule (100 mg total) by mouth 2 (two) times daily.    Dispense:  20 capsule    Refill:  0  . levofloxacin (LEVAQUIN) 500 MG tablet    Sig: Take 1 tablet (500 mg total) by mouth daily.    Dispense:  10 tablet     Refill:  0  . traMADol (ULTRAM) 50 MG tablet    Sig: Take 1 tablet (50 mg total) by mouth every 6 (six) hours as needed. For pain    Dispense:  180 tablet    Refill:  5    Return in about 6 weeks (around 02/27/2015) for recheck.     Jeffren Dombek Paulita Fujita, M.D. [facility] [address] [place], [place]  [id number] [phone] phone [phone] fax

## 2015-01-17 LAB — IFOBT (OCCULT BLOOD): IMMUNOLOGICAL FECAL OCCULT BLOOD TEST: POSITIVE

## 2015-01-17 LAB — COMPREHENSIVE METABOLIC PANEL
AST: 18 U/L (ref 0–37)
Albumin: 2.2 g/dL — ABNORMAL LOW (ref 3.5–5.2)
Alkaline Phosphatase: 76 U/L (ref 39–117)
BILIRUBIN TOTAL: 0.4 mg/dL (ref 0.2–1.2)
BUN: 35 mg/dL — ABNORMAL HIGH (ref 6–23)
CALCIUM: 8.3 mg/dL — AB (ref 8.4–10.5)
CO2: 31 mEq/L (ref 19–32)
Chloride: 93 mEq/L — ABNORMAL LOW (ref 96–112)
Creat: 1.29 mg/dL — ABNORMAL HIGH (ref 0.50–1.10)
Glucose, Bld: 107 mg/dL — ABNORMAL HIGH (ref 70–99)
POTASSIUM: 3.9 meq/L (ref 3.5–5.3)
Sodium: 133 mEq/L — ABNORMAL LOW (ref 135–145)
Total Protein: 6.5 g/dL (ref 6.0–8.3)

## 2015-01-17 NOTE — Addendum Note (Signed)
Addended by: Venetia Night on: 01/17/2015 02:25 PM   Modules accepted: Orders

## 2015-01-19 LAB — URINE CULTURE: Colony Count: >=100000

## 2015-01-21 NOTE — Addendum Note (Signed)
Addended by: Wardell Honour on: 01/21/2015 03:31 PM   Modules accepted: Orders

## 2015-01-24 ENCOUNTER — Ambulatory Visit (INDEPENDENT_AMBULATORY_CARE_PROVIDER_SITE_OTHER): Payer: PPO | Admitting: *Deleted

## 2015-01-24 ENCOUNTER — Telehealth: Payer: Self-pay | Admitting: Cardiology

## 2015-01-24 DIAGNOSIS — I255 Ischemic cardiomyopathy: Secondary | ICD-10-CM | POA: Diagnosis not present

## 2015-01-24 DIAGNOSIS — I5022 Chronic systolic (congestive) heart failure: Secondary | ICD-10-CM

## 2015-01-24 LAB — MDC_IDC_ENUM_SESS_TYPE_REMOTE
Battery Remaining Longevity: 101 mo
Brady Statistic AP VP Percent: 1.02 %
Brady Statistic AP VS Percent: 0.07 %
Date Time Interrogation Session: 20160419133550
HighPow Impedance: 55 Ohm
Lead Channel Impedance Value: 323 Ohm
Lead Channel Impedance Value: 380 Ohm
Lead Channel Impedance Value: 456 Ohm
Lead Channel Impedance Value: 494 Ohm
Lead Channel Impedance Value: 494 Ohm
Lead Channel Pacing Threshold Amplitude: 0.625 V
Lead Channel Pacing Threshold Pulse Width: 0.4 ms
Lead Channel Sensing Intrinsic Amplitude: 1.875 mV
Lead Channel Sensing Intrinsic Amplitude: 1.875 mV
Lead Channel Setting Pacing Amplitude: 2 V
Lead Channel Setting Pacing Amplitude: 2 V
Lead Channel Setting Sensing Sensitivity: 0.3 mV
MDC IDC MSMT BATTERY VOLTAGE: 3.02 V
MDC IDC MSMT LEADCHNL LV IMPEDANCE VALUE: 266 Ohm
MDC IDC MSMT LEADCHNL LV IMPEDANCE VALUE: 266 Ohm
MDC IDC MSMT LEADCHNL LV IMPEDANCE VALUE: 304 Ohm
MDC IDC MSMT LEADCHNL LV IMPEDANCE VALUE: 399 Ohm
MDC IDC MSMT LEADCHNL LV IMPEDANCE VALUE: 456 Ohm
MDC IDC MSMT LEADCHNL LV IMPEDANCE VALUE: 494 Ohm
MDC IDC MSMT LEADCHNL LV PACING THRESHOLD AMPLITUDE: 0.875 V
MDC IDC MSMT LEADCHNL RA PACING THRESHOLD AMPLITUDE: 0.5 V
MDC IDC MSMT LEADCHNL RA PACING THRESHOLD PULSEWIDTH: 0.4 ms
MDC IDC MSMT LEADCHNL RV IMPEDANCE VALUE: 380 Ohm
MDC IDC MSMT LEADCHNL RV IMPEDANCE VALUE: 456 Ohm
MDC IDC MSMT LEADCHNL RV PACING THRESHOLD PULSEWIDTH: 0.4 ms
MDC IDC MSMT LEADCHNL RV SENSING INTR AMPL: 20.125 mV
MDC IDC MSMT LEADCHNL RV SENSING INTR AMPL: 20.125 mV
MDC IDC SET LEADCHNL LV PACING PULSEWIDTH: 0.4 ms
MDC IDC SET LEADCHNL RA PACING AMPLITUDE: 1.5 V
MDC IDC SET LEADCHNL RV PACING PULSEWIDTH: 0.4 ms
MDC IDC SET ZONE DETECTION INTERVAL: 300 ms
MDC IDC SET ZONE DETECTION INTERVAL: 350 ms
MDC IDC SET ZONE DETECTION INTERVAL: 450 ms
MDC IDC STAT BRADY AS VP PERCENT: 96.84 %
MDC IDC STAT BRADY AS VS PERCENT: 2.07 %
MDC IDC STAT BRADY RA PERCENT PACED: 1.09 %
MDC IDC STAT BRADY RV PERCENT PACED: 0.7 %
Zone Setting Detection Interval: 250 ms

## 2015-01-24 NOTE — Telephone Encounter (Signed)
Decreased pressure to 12 to accommodate new mask with gel nasal pillows due to decub on patient's nose.

## 2015-01-24 NOTE — Telephone Encounter (Signed)
New message      Need to get a verbal order on a mask fit---need to decrease pressure.  He says he needs Dr Radford Pax to call him ASAP Pt has a sore on nose all the way to the bone

## 2015-01-24 NOTE — Progress Notes (Signed)
Remote ICD transmission.   

## 2015-01-25 ENCOUNTER — Encounter: Payer: Self-pay | Admitting: Physician Assistant

## 2015-01-25 DIAGNOSIS — Z0289 Encounter for other administrative examinations: Secondary | ICD-10-CM

## 2015-01-25 NOTE — Telephone Encounter (Signed)
Message sent to [facility] 

## 2015-01-25 NOTE — Telephone Encounter (Signed)
DME recommended decreasing the pressure to 12 to accommodate the mask that will not interfere with decub.

## 2015-01-25 NOTE — Telephone Encounter (Signed)
Please get a d/l in 2 weeks

## 2015-01-25 NOTE — Telephone Encounter (Signed)
Did DME recommend decreasing pressure?

## 2015-01-26 ENCOUNTER — Ambulatory Visit
Admit: 2015-01-26 | Disposition: A | Discharge status: Home / Self Care | Payer: Self-pay | Attending: Surgery | Admitting: Surgery

## 2015-01-27 ENCOUNTER — Ambulatory Visit (INDEPENDENT_AMBULATORY_CARE_PROVIDER_SITE_OTHER): Payer: PPO | Admitting: Neurology

## 2015-01-27 ENCOUNTER — Encounter: Payer: Self-pay | Admitting: Neurology

## 2015-01-27 VITALS — BP 110/60 | HR 82 | Ht 68.0 in | Wt 262.4 lb

## 2015-01-27 DIAGNOSIS — G8220 Paraplegia, unspecified: Secondary | ICD-10-CM

## 2015-01-27 DIAGNOSIS — E0842 Diabetes mellitus due to underlying condition with diabetic polyneuropathy: Secondary | ICD-10-CM | POA: Diagnosis not present

## 2015-01-27 DIAGNOSIS — G8332 Monoplegia, unspecified affecting left dominant side: Secondary | ICD-10-CM | POA: Diagnosis not present

## 2015-01-27 DIAGNOSIS — R269 Unspecified abnormalities of gait and mobility: Secondary | ICD-10-CM

## 2015-01-27 LAB — CK: Total CK: 24 U/L (ref 7–177)

## 2015-01-27 LAB — RHEUMATOID FACTOR: Rhuematoid fact SerPl-aCnc: <10 IU/mL (ref <=14)

## 2015-01-27 LAB — C-REACTIVE PROTEIN: CRP: 15.2 mg/dL — AB (ref <0.60)

## 2015-01-27 NOTE — Patient Instructions (Addendum)
1.  EMG of the LEFT arm and RIGHT leg  2.  Check blood work 3.  Please be extra cautious when walking as you are HIGH risk for falls 4.

## 2015-01-27 NOTE — Progress Notes (Signed)
[facility] Neurology Division Clinic Note - Initial Visit   Date: 01/27/2015   [name] MRN: [id number] DOB: Dec 02, 1955   Dear Dr. Merla Riches:   Thank you for your kind referral of [name] for consultation of generalized weakness. Although her history is well known to you, please allow [place] to reiterate it for the purpose of our medical record. The patient was accompanied to the clinic by daughter and grandson who also provides collateral information.     History of Present Illness: [name] is a 59 y.o. right-handed Caucasian female with diabetes mellitus type 2 since age of 58 (HbA1c 6.7) complicated by neuropathy, left leg diabetic amyotrophy (2011), and poor wound healing who is referred for evaluation of generalized weakness. Other medical comorbidities include hypertension, congestive heart failure (EF 15%) with cardiomyopathy s/p PPM/ICD, and CAD.  Patient has a history of diabetic amyotrophy involving the left lower extremity 2011. At that time, she was under the care of Dr. Sandria Manly at [facility], who has since retired. Hemoglobin A1c in 2011 was noted to be 15 and she had associated loss of 130 pounds in the preceding year. Slowly, she developed left knee buckling and leg weakness which progressed to where she had a fall and had to be hospitalized. She has had persistent numbness involving her left lower extremity. Imaging at that time showed multilevel spondylosis as well as postsurgical L4-5 and L5-S1 left hemilaminectomy and discectomy. Electrodiagnostic showed profound denervation involving the vastus medialis and iliopsoas muscles in addition to sensory abnormalities, suggesting diabetic amyotrophy. She underwent treatment with IVIG and was ultimately discharged to rehabilitation facility. Over the next 8 months, she continued to do rehabilitation and was eventually able to walk independently again, but still using a walker as  needed.  Patient was reportedly doing well until February 2016 when she slowly started developing painless left foot drop and bilateral proximal leg weakness.  Prior to February, she was walking independently, but now is dependent on a walk, and unable to make transfers independently. She has functionally dependent on her family members to transfer out of chairs, off the commode, as well as into and out of a car.  She is able to bathe and dress herself. He has not had any falls but is very cautious when walking. She endorses difficulty with climbing stairs and walking. She has residual numbness of the left leg.  In mid-April, she developed weakness of the left last two fingers, where she is unable to extend the 4th and 5th digits on the left.   She has difficulty gripping things with her left hand. There is no associated numbness/tingling of the hand.  Denies any neck or back pain.    No preceding illness, recen travel, or immunizations.  Denies any tick bites.   Out-side paper records, electronic medical record, and images have been reviewed where available and summarized as:  Lab Results  Component Value Date   HGBA1C 6.7 12/30/2014   Lab Results  Component Value Date   TSH 2.072 12/30/2014   Lab Results  Component Value Date   VITAMINB12 479 12/30/2014   Labs 05/17/2010: SPEP no M protein, ESR 36, vitamin B 09/08/1973, ANA negative, Ace 11, rheumatoid factor VIII.9, TSH 1.18, MRI lumbar spine 05/17/2010: 1. Unchanged MRI from 03/22/2010. No epidural abscess is identified. No compression fracture or acute abnormality. 2. Postoperative changes on the left at L4-L5 and L5-S1. 3. Unchanged left paracentral and posterolateral shallow disc protrusion at L5-S1 potentially  affecting the descending left S1 nerve root.  MRI brain wo contrast 05/17/2010: Small white matter lesions in the frontal lobes bilaterally. These are likely due to chronic microvascular ischemia. Demyelinating disease is  not considered likely in this case. No acute abnormality.  Past Medical History  Diagnosis Date  . Obesity, unspecified   . Unspecified vitamin D deficiency   . Pure hypercholesterolemia   . Neuropathy 2011  . Chicken pox   . Coronary artery disease   . CHF (congestive heart failure) 07/19/2012    EF 15% by cardiac catheterization.  Admission.  . Hypertension   . Anemia   . Automatic implantable cardioverter-defibrillator in situ   . Type II diabetes mellitus   . Myocardial infarct 07/19/12    s/p cardiac catheterization with stenting to LAD.  Marland Kitchen History of blood transfusion ~ 2011    "plasma; had neuropathy; couldn't walk"  . OSA on CPAP     Moderate with AHI 23/hr and now on CPAP at 16cm H2O.  Her DME is [facility]    Past Surgical History  Procedure Laterality Date  . Lumbar disc surgery  1996    L4-5  . Incision and drainage abscess Right 2007    groin; with ICU stay due to sepsis.  . Bilateral oophorectomy  01/2011    ovarian cyst benign  . Bi-ventricular implantable cardioverter defibrillator  (crt-d)  07/06/2014  . Laparoscopic cholecystectomy  2011  . Back surgery    . Coronary angioplasty with stent placement Left 07/2012    new onset systolic CHF; elevated troponins.  Cardiac catheterization with stenting to LAD; EF 15%.  2D-echo: EF 20-25%.  . Vaginal hysterectomy  01/2011    Fibroids/DUB.  Ovaries removed. Fontaine.  . Tubal ligation  1981  . Left heart catheterization with coronary angiogram N/A 07/21/2012    Procedure: LEFT HEART CATHETERIZATION WITH CORONARY ANGIOGRAM;  Surgeon: Dolores Patty, MD;  Location: [facility] CATH LAB;  Service: Cardiovascular;  Laterality: N/A;  . Percutaneous coronary stent intervention (pci-s) N/A 07/23/2012    Procedure: PERCUTANEOUS CORONARY STENT INTERVENTION (PCI-S);  Surgeon: Tonny Bollman, MD;  Location: [facility] CATH LAB;  Service: Cardiovascular;  Laterality: N/A;  . Bi-ventricular implantable cardioverter defibrillator N/A 07/06/2014      Procedure: BI-VENTRICULAR IMPLANTABLE CARDIOVERTER DEFIBRILLATOR  (CRT-D);  Surgeon: Duke Salvia, MD;  Location[facility] CATH LAB;  Service: Cardiovascular;  Laterality: N/A;     Medications:  Current Outpatient Prescriptions on File Prior to Visit  Medication Sig Dispense Refill  . aspirin EC 81 MG tablet Take 1 tablet (81 mg total) by mouth daily. 90 tablet 3  . carvedilol (COREG) 25 MG tablet Take 1 tablet (25 mg total) by mouth 2 (two) times daily with a meal. 180 tablet 3  . clopidogrel (PLAVIX) 75 MG tablet Take 1 tablet (75 mg total) by mouth daily. 90 tablet 3  . doxycycline (VIBRAMYCIN) 100 MG capsule Take 1 capsule (100 mg total) by mouth 2 (two) times daily. 20 capsule 0  . ferrous sulfate 325 (65 FE) MG tablet Take 325 mg by mouth 2 (two) times daily with a meal.    . fluticasone (FLONASE) 50 MCG/ACT nasal spray 1 spray each nostril twice a day 16 g 6  . gabapentin (NEURONTIN) 600 MG tablet Take 1 tablet (600 mg total) by mouth 3 (three) times daily. 270 tablet 3  . glimepiride (AMARYL) 4 MG tablet Take 1 tablet (4 mg total) by mouth 2 (two) times daily before a meal. 180  tablet 3  . glucose blood test strip Test blood sugar daily. Dx code: 250.00 100 each 4  . hydrALAZINE (APRESOLINE) 50 MG tablet Take 1 tablet (50 mg total) by mouth 3 (three) times daily. 270 tablet 3  . isosorbide mononitrate (IMDUR) 60 MG 24 hr tablet Take 1 tablet (60 mg total) by mouth daily. 90 tablet 3  . levofloxacin (LEVAQUIN) 500 MG tablet Take 1 tablet (500 mg total) by mouth daily. 10 tablet 0  . metFORMIN (GLUCOPHAGE) 1000 MG tablet Take 1 tablet (1,000 mg total) by mouth daily. 90 tablet 0  . nitroGLYCERIN (NITROSTAT) 0.4 MG SL tablet Place 1 tablet (0.4 mg total) under the tongue every 5 (five) minutes as needed for chest pain. 30 tablet 0  . nortriptyline (PAMELOR) 50 MG capsule Take 1 capsule (50 mg total) by mouth at bedtime. 90 capsule 3  . pravastatin (PRAVACHOL) 40 MG tablet Take 1 tablet (40  mg total) by mouth at bedtime. 90 tablet 3  . sitaGLIPtin (JANUVIA) 100 MG tablet Take 1 tablet (100 mg total) by mouth daily. 90 tablet 0  . torsemide (DEMADEX) 20 MG tablet Take 2 tablets (40 mg total) by mouth daily. 60 tablet 3  . traMADol (ULTRAM) 50 MG tablet Take 1 tablet (50 mg total) by mouth every 6 (six) hours as needed. For pain 180 tablet 5   No current facility-administered medications on file prior to visit.    Allergies: No Known Allergies  Family History: Family History  Problem Relation Age of Onset  . Diabetes Mother   . Hypertension Mother   . Arthritis Mother     knees, lumbar DDD, cervical DDD  . Cancer Father     prostate,skin,lymphoma.  . Obesity Brother   . Diabetes Maternal Grandmother   . Diabetes Paternal Grandmother     Social History: History   Social History  . Marital Status: Married    Spouse Name: N/A  . Number of Children: 2  . Years of Education: 12   Occupational History  . disabled     03/2010 for peripheral neuropathy  . home daycare     x 20 yrs.   Social History Main Topics  . Smoking status: Never Smoker   . Smokeless tobacco: Never Used  . Alcohol Use: No  . Drug Use: No  . Sexual Activity: No   Other Topics Concern  . Not on file   Social History Narrative   Marital status:Married x 42 yrs. Happily married, no abuse.       Children:  2 children (daughter 14, son 23). One grandson and 2 step grandchildren. Daughter and grandson live in home with pt and spouse.       Lives: with husband.      Employment: disability for peripheral neuropathy 2012.  Previously had home daycare.      Tobacco: none      Alcohol: none      Drugs: none      Exercise: none.  [employer].      Pets: dog.      Always uses seat belts, smoke detectors in home.      No guns in the home.       Caffeine use: 2 cups coffee per day.       Nutrition: Well balanced diet.    Review of Systems:  CONSTITUTIONAL: No fevers, chills, night  sweats, or weight loss.   EYES: No visual changes or eye pain ENT: No hearing  changes.  No history of nose bleeds.   RESPIRATORY: No cough, wheezing and shortness of breath.   CARDIOVASCULAR: Negative for chest pain, and palpitations.   GI: Negative for abdominal discomfort, blood in stools or black stools.  No recent change in bowel habits.   GU:  No history of incontinence.   MUSCLOSKELETAL: +history of joint pain or swelling.  No myalgias.   SKIN: Negative for lesions, rash, and itching.   HEMATOLOGY/ONCOLOGY: Negative for prolonged bleeding, bruising easily, and swollen nodes.  No history of cancer.   ENDOCRINE: Negative for cold or heat intolerance, polydipsia or goiter.   PSYCH:  No depression or anxiety symptoms.   NEURO: As Above.   Vital Signs:  BP 110/60 mmHg  Pulse 82  Ht 5\' 8"  (1.727 m)  Wt 262 lb 7 oz (119.041 kg)  BMI 39.91 kg/m2  SpO2 93%   General Medical Exam:   General:  Sitting in a wheel chair, comfortable.   Eyes/ENT: see cranial nerve examination.  Scab over the bridge of her nose (CPAP mask) Neck:  Full range of motion without tenderness.  No carotid bruits. Respiratory:  Clear to auscultation, good air entry bilaterally.   Cardiac:  Regular rate and rhythm, no murmur.   Extremities:  Left foot in a boot due to ulceration of the heel  Neurological Exam: MENTAL STATUS including orientation to time, place, person, recent and remote memory, attention span and concentration, language, and fund of knowledge is normal.  Speech is not dysarthric.  CRANIAL NERVES: II:  No visual field defects.  Unremarkable fundi.   III-IV-VI: Pupils equal round and reactive to light.  Normal conjugate, extra-ocular eye movements in all directions of gaze.  No nystagmus.  No ptosis.   V:  Normal facial sensation.  VII:  Normal facial symmetry and movements.  No pathologic facial reflexes.  VIII:  Normal hearing and vestibular function.   IX-X:  Normal palatal movement.   XI:   Normal shoulder shrug and head rotation.   XII:  Normal tongue strength and range of motion, no deviation or fasciculation.  MOTOR:  No atrophy, fasciculations or abnormal movements.  No pronator drift.  Tone is normal.    Right Upper Extremity:    Left Upper Extremity:    Deltoid  5/5   Deltoid  5/5   Biceps  5/5   Biceps  5/5   Triceps  5/5   Triceps  5/5   Wrist extensors  5/5   Wrist extensors  5/5   Wrist flexors  5-/5   Wrist flexors  5/5   Finger extensors  5/5   Finger extensors - worse 4th/5th digit 3/5   FDP 4/5 5/5  FDP 4/5 4/5  Finger flexors  5/5   Finger flexors  5/5   Dorsal interossei  4/5   Dorsal interossei  4/5   Abductor pollicis  5-/5   Abductor pollicis  5/5   Tone (Ashworth scale)  0  Tone (Ashworth scale)  0   Right Lower Extremity:    Left Lower Extremity:    Hip flexors  3/5   Hip flexors  2+/5   Hip extensors  4/5   Hip extensors  4/5   Knee flexors  4/5   Knee flexors  4/5   Knee extensors  5-/5   Knee extensors  5-/5   Dorsiflexors  5/5   Dorsiflexors  2/5   Plantarflexors  5/5   Plantarflexors  3/5   Toe extensors  5-/5   Toe extensors * not assessed   Toe flexors  5-/5   Toe flexors * not assessed   Tone (Ashworth scale)  0  Tone (Ashworth scale)  0   MSRs:  Right                                                                 Left brachioradialis 2+  brachioradialis 2+  biceps 2+  biceps 1+  triceps 2+  triceps 2+  patellar 0  Patellar 0  ankle jerk 0  ankle jerk 0  Hoffman no  Hoffman no  plantar response mute  plantar response N/a   SENSORY: Vibration reduced to 80% at the knees and absent distal to ankles. Pinprick is absent in the lower extremities. Light touch is reduced distal to knees bilaterally. Sensation to all modalities it is intact in the upper extremities.  COORDINATION/GAIT: Normal finger-to- nose-finger.  She is unable to rise from a chair without using arms.  Gait was not tested as patient due to the severity of patient's lower  extremity weakness.    IMPRESSION: Mrs. Roetman is a 59 year-old female with long-standing history of diabetes mellitus (HbA1c 6.7) presenting for evaluation of new onset of bilateral leg and distal hand weakness. This is a complex case.  Her clinical exam shows asymmetrical proximal and distal weakness of the upper and lower extremities, which is worse on the left. However, given her history of diabetic amyotrophy involving the left lower extremity, patient feels that her right leg is more problematic. There has been a stepwise progression of painless weakness including left foot drop, bilateral proximal leg weakness, and the left claw hand. Even though she denies any pain, mononeuritis multiplex as well as polyradiculoneuropathy needs to be considered. Myopathy seems to be less likely, but I will screen for elevation in her muscle enzymes. I will proceed with electrodiagnostic testing of the LEFT upper extremity and RIGHT lower extremity. Because of her left foot ulceration, nerve conduction studies in this extremity will be limited.   PLAN/RECOMMENDATIONS:  1.  ESR, CRP, CK, aldolase, ANA, ENA, cryoglobulin, p-ANCA, c-ANCA, copper, ceruloplasmin, ACE, RF, SPEP/UPEP with IFE, vitamin B1 2.  NCS/EMG of the LEFT upper extremity and RIGHT lower extremity 3.  I had a lengthy discussion regarding home safety especially as she is high fall risk.  4.  Home health referral was declined at this time  5.  Return to clinic after testing   The duration of this appointment visit was 60 minutes of face-to-face time with the patient.  Greater than 50% of this time was spent in counseling, explanation of diagnosis, planning of further management, and coordination of care.   Thank you for allowing me to participate in patient's care.  If I can answer any additional questions, I would be pleased to do so.    Sincerely,    Damarien Nyman K. Allena Katz, DO

## 2015-01-28 LAB — SEDIMENTATION RATE: Sed Rate: 65 mm/hr — ABNORMAL HIGH (ref 0–30)

## 2015-01-29 LAB — COPPER, SERUM: Copper: 131 ug/dL (ref 70–175)

## 2015-01-30 LAB — ENA 9 PANEL
Centromere Ab Screen: <1
DS DNA AB: 1 [IU]/mL
ENA SM Ab Ser-aCnc: <1
JO-1 ANTIBODY, IGG: NEGATIVE
Ribosomal P Protein Ab: <1
SM/RNP: <1
SSA (Ro) (ENA) Antibody, IgG: <1
SSB (LA) (ENA) ANTIBODY, IGG: NEGATIVE
Scleroderma (Scl-70) (ENA) Antibody, IgG: <1

## 2015-01-30 LAB — ANA: ANA: POSITIVE — AB

## 2015-01-30 LAB — ANCA TITERS: P-ANCA: 1:640 {titer} — ABNORMAL HIGH

## 2015-01-30 LAB — ANCA SCREEN W REFLEX TITER
ATYPICAL P-ANCA SCREEN: NEGATIVE
C-ANCA SCREEN: NEGATIVE
P-ANCA SCREEN: POSITIVE — AB

## 2015-01-30 LAB — ANGIOTENSIN CONVERTING ENZYME: Angiotensin-Converting Enzyme: 46 U/L (ref 8–52)

## 2015-01-30 LAB — ANTI-NUCLEAR AB-TITER (ANA TITER): ANA Titer 1: 1:640 {titer} — ABNORMAL HIGH

## 2015-01-30 LAB — CERULOPLASMIN: Ceruloplasmin: 31 mg/dL (ref 18–53)

## 2015-01-31 ENCOUNTER — Encounter: Payer: Self-pay | Admitting: Cardiology

## 2015-01-31 LAB — UIFE/LIGHT CHAINS/TP QN, 24-HR UR
ALBUMIN, U: DETECTED
ALPHA 1 UR: DETECTED — AB
Alpha 2, Urine: DETECTED — AB
Beta, Urine: DETECTED — AB
Gamma Globulin, Urine: DETECTED — AB
TOTAL PROTEIN, URINE-UPE24: 16 mg/dL (ref 5–24)

## 2015-01-31 LAB — VITAMIN B1: Vitamin B1 (Thiamine): 17 nmol/L (ref 8–30)

## 2015-01-31 LAB — ALDOLASE: Aldolase: 8 U/L (ref <=8.1)

## 2015-02-01 LAB — CRYOGLOBULIN

## 2015-02-01 LAB — SPEP & IFE WITH QIG
ALPHA-1-GLOBULIN: 0.7 g/dL — AB (ref 0.2–0.3)
Albumin ELP: 2.1 g/dL — ABNORMAL LOW (ref 3.8–4.8)
Alpha-2-Globulin: 0.9 g/dL (ref 0.5–0.9)
Beta 2: 0.3 g/dL (ref 0.2–0.5)
Beta Globulin: 0.4 g/dL (ref 0.4–0.6)
GAMMA GLOBULIN: 1.7 g/dL (ref 0.8–1.7)
IGM, SERUM: 518 mg/dL — AB (ref 52–322)
IgA: 346 mg/dL (ref 69–380)
IgG (Immunoglobin G), Serum: 1550 mg/dL (ref 690–1700)
TOTAL PROTEIN, SERUM ELECTROPHOR: 6.1 g/dL (ref 6.1–8.1)

## 2015-02-02 ENCOUNTER — Other Ambulatory Visit: Payer: Self-pay | Admitting: *Deleted

## 2015-02-02 DIAGNOSIS — G609 Hereditary and idiopathic neuropathy, unspecified: Secondary | ICD-10-CM

## 2015-02-02 DIAGNOSIS — G8330 Monoplegia, unspecified affecting unspecified side: Secondary | ICD-10-CM

## 2015-02-06 ENCOUNTER — Telehealth: Payer: Self-pay | Admitting: *Deleted

## 2015-02-06 DIAGNOSIS — M86179 Other acute osteomyelitis, unspecified ankle and foot: Secondary | ICD-10-CM | POA: Insufficient documentation

## 2015-02-06 HISTORY — DX: Other acute osteomyelitis, unspecified ankle and foot: M86.179

## 2015-02-06 NOTE — Telephone Encounter (Signed)
Patient requesting letter for [employer]. Patient states she is in a wheelchair and can not get in and out Call back number [phone]

## 2015-02-07 ENCOUNTER — Encounter: Payer: Self-pay | Admitting: Neurology

## 2015-02-07 ENCOUNTER — Ambulatory Visit: Payer: PPO

## 2015-02-07 ENCOUNTER — Other Ambulatory Visit: Payer: Self-pay | Admitting: Infectious Diseases

## 2015-02-07 ENCOUNTER — Encounter: Payer: PPO | Attending: Surgery | Admitting: Surgery

## 2015-02-07 ENCOUNTER — Telehealth: Payer: Self-pay | Admitting: Family Medicine

## 2015-02-07 DIAGNOSIS — E11621 Type 2 diabetes mellitus with foot ulcer: Secondary | ICD-10-CM | POA: Diagnosis not present

## 2015-02-07 DIAGNOSIS — N289 Disorder of kidney and ureter, unspecified: Secondary | ICD-10-CM | POA: Diagnosis not present

## 2015-02-07 DIAGNOSIS — L97529 Non-pressure chronic ulcer of other part of left foot with unspecified severity: Secondary | ICD-10-CM | POA: Insufficient documentation

## 2015-02-07 DIAGNOSIS — S0033XA Contusion of nose, initial encounter: Secondary | ICD-10-CM | POA: Insufficient documentation

## 2015-02-07 DIAGNOSIS — M869 Osteomyelitis, unspecified: Secondary | ICD-10-CM | POA: Insufficient documentation

## 2015-02-07 DIAGNOSIS — G4733 Obstructive sleep apnea (adult) (pediatric): Secondary | ICD-10-CM | POA: Diagnosis not present

## 2015-02-07 DIAGNOSIS — R0902 Hypoxemia: Secondary | ICD-10-CM | POA: Insufficient documentation

## 2015-02-07 DIAGNOSIS — I5022 Chronic systolic (congestive) heart failure: Secondary | ICD-10-CM | POA: Diagnosis not present

## 2015-02-07 DIAGNOSIS — E1142 Type 2 diabetes mellitus with diabetic polyneuropathy: Secondary | ICD-10-CM | POA: Insufficient documentation

## 2015-02-07 DIAGNOSIS — I10 Essential (primary) hypertension: Secondary | ICD-10-CM | POA: Diagnosis not present

## 2015-02-07 DIAGNOSIS — L97422 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 layer exposed: Secondary | ICD-10-CM | POA: Diagnosis not present

## 2015-02-07 DIAGNOSIS — E785 Hyperlipidemia, unspecified: Secondary | ICD-10-CM | POA: Diagnosis not present

## 2015-02-07 DIAGNOSIS — D649 Anemia, unspecified: Secondary | ICD-10-CM | POA: Diagnosis not present

## 2015-02-07 DIAGNOSIS — X58XXXA Exposure to other specified factors, initial encounter: Secondary | ICD-10-CM | POA: Diagnosis not present

## 2015-02-07 NOTE — Progress Notes (Signed)
DOREENA, LORY ([id number]) Visit Report for 02/07/2015 HBO Risk Assessment Details Patient Name: COURTNE, SIBLE Date of Service: 02/07/2015 1:45 PM Medical Record Number: [id number] Patient Account Number: [id number] Date of Birth/Sex: Jun 30, 1956 (59 y.o. Female) Treating RN: Afful, RN, BSN, [place] Primary Care Physician: [name] Other Clinician: Referring Physician: [name] Treating Physician/Extender: Frann Rider in Treatment: 4 HBO Risk Assessment Items Answer Barotrauma Risks: Upper Respiratory Infections No Prior Radiation Treatment to Head/Neck No Tracheostomy No Ear problems or surgery (otosclerosis)- Consider pressure No equalization tubes Sinus Problems, Sinus Obstruction No Pulmonary Risks: Currently seeing a pulmonologisto No Emphysema No Tuberculosis No Other lung problems (COPD with CO2 retention, lesions, surgery) No -Refer to CPGs Congestive heart Failure -Consider holding HBO if ejection No fraction<30% History of smoking No Bullous Disease, Blebs No Other pulmonary abnormalities No Cardiac Risks: Dr Currently seeing a cardiologisto Yes Caryl Comes Pacemaker/AICD Yes Hypertension Yes Diuretic Used (water pill). If yes, last time taken: Yes History of prior or current malignancy (Cancer) Surgery No Radiation therapy No Chemotherapy No Ophthalmic Risks: AMABELLE, WARDLE ([id number]) Optic Neuritis No Cataracts No Myopia No Confinement Anxiety Claustrophobia No Dialysis Dialysis No Any implants; medical or non-medical No Pregnancy No Diabetes if Yes for Diabetes: Non-Insulin Dependent metformin, glyburide, Diabetes Medication: januvia Long acting insulin- List: none Short acting Insulin-List: none HgbA1C within 3 months Yes Seizures Seizures No Currently using these medications: Aspirin Yes Digoxin (CHF patient) No Narcotics No Nitroprusside No Phenothiazine (Thorazine,etc.) No Prednisone or other steroids  No Disulfiram (Antabuse) No Mafenide Acetate (Sulfamylon-burn cream) No Amiodarone No Electronic Signature(s) Signed: 02/07/2015 3:22:58 PM By: Regan Lemming BSN, RN Entered By: Regan Lemming on 02/07/2015 14:32:29

## 2015-02-07 NOTE — Progress Notes (Signed)
Female) Treating RN: Vickie Gouty, RN, BSN, Velva Harman Primary Care Physician: [name] Other Clinician: Referring Physician: [name] Treating Physician/Extender: Frann Rider in Treatment: 4 Education Assessment Education Provided To: Patient Education Topics Provided Basic Hygiene: Methods: Explain/Verbal Responses: State content correctly Wound/Skin Impairment: Methods: Explain/Verbal Responses: State content correctly Electronic Signature(s) Signed: 02/07/2015 3:22:58 PM By: Regan Lemming BSN, RN Entered By: Regan Lemming on 02/07/2015 14:39:24 [name] ([id number]) -------------------------------------------------------------------------------- Wound Assessment Details Patient Name: [name] Date of Service: 02/07/2015 1:45 PM Medical Record Number: [id number] Patient Account Number: [id number] Date of Birth/Sex: Nov 14, 1955 (58 y.o. Female) Treating RN: Afful, RN, BSN, [place] Primary Care Physician: [name] Other Clinician: Referring Physician: [name] Treating Physician/Extender: Frann Rider in Treatment: 4 Wound Status Wound Number: 1 Primary Diabetic Wound/Ulcer of the Lower Etiology: Extremity Wound Location: Left Calcaneous Wound Open Wounding Event: Gradually Appeared Status: Date  Acquired: 01/03/2015 Comorbid Anemia, Arrhythmia, Congestive Heart Weeks Of Treatment: 4 History: Failure, Hypertension, Type II Diabetes Clustered Wound: No Photos Photo Uploaded By: Regan Lemming on 02/07/2015 15:44:19 Wound Measurements Length: (cm) 2 Width: (cm) 2 Depth: (cm) 0.3 Area: (cm) 3.142 Volume: (cm) 0.942 % Reduction in Area: 20% % Reduction in Volume: -139.7% Epithelialization: Small (1-33%) Tunneling: No Undermining: No Wound Description Classification: Grade 2 Wound Margin: Distinct, outline attached Exudate Amount: Large Exudate Type: Serosanguineous Exudate Color: red, brown Foul Odor After Cleansing: No Wound Bed Granulation Amount: Medium (34-66%) Exposed Structure Granulation Quality: Pink Fascia Exposed: No Necrotic Amount: Medium (34-66%) Fat Layer Exposed: No Necrotic Quality: Eschar, Adherent Slough Tendon Exposed: No [name], [name]. ([id number]) Muscle Exposed: No Joint Exposed: No Bone Exposed: No Limited to Skin Breakdown Periwound Skin Texture Texture Color No Abnormalities Noted: No No Abnormalities Noted: No Callus: No Atrophie Blanche: No Crepitus: No Cyanosis: No Excoriation: No Ecchymosis: No Fluctuance: No Erythema: No Friable: No Hemosiderin Staining: No Induration: No Mottled: No Localized Edema: No Pallor: No Rash: No Rubor: No Scarring: No Temperature / Pain Moisture Temperature: No Abnormality No Abnormalities Noted: No Tenderness on Palpation: Yes Dry / Scaly: No Maceration: Yes Moist: Yes Wound Preparation Ulcer Cleansing: Rinsed/Irrigated with Saline Topical Anesthetic Applied: Other: lidocaine 4%, Treatment Notes Wound #1 (Left Calcaneous) 1. Cleansed with: Clean wound with Normal Saline 4. Dressing Applied: Santyl Ointment 5. Secondary Dressing Applied Gauze and Kerlix/Conform 7. Secured with Tape Notes sage boot at [employer]) Signed: 02/07/2015 3:22:58 PM By: Regan Lemming  BSN, RN Entered By: Regan Lemming on 02/07/2015 14:06:46 [name] ([id number]) -------------------------------------------------------------------------------- Wound Assessment Details Patient Name: [name] Date of Service: 02/07/2015 1:45 PM Medical Record Number: [id number] Patient Account Number: [id number] Date of Birth/Sex: 03-20-1956 (58 y.o. Female) Treating RN: Afful, RN, BSN, [place] Primary Care Physician: [name] Other Clinician: Referring Physician: [name] Treating Physician/Extender: Frann Rider in Treatment: 4 Wound Status Wound Number: 2 Primary Diabetic Wound/Ulcer of the Lower Etiology: Extremity Wound Location: Toe Second - Dorsal Wound Open Wounding Event: Gradually Appeared Status: Date Acquired: 11/09/2014 Comorbid Anemia, Arrhythmia, Congestive Heart Weeks Of Treatment: 4 History: Failure, Hypertension, Type II Diabetes Clustered Wound: No Photos Photo Uploaded By: Regan Lemming on 02/07/2015 15:44:20 Wound Measurements Length: (cm) 0.1 Width: (cm) 0.1 Depth: (cm) 0.1 Area: (cm) 0.008 Volume: (cm) 0.001 % Reduction in Area: 98.4% % Reduction in Volume: 99% Epithelialization: Small (1-33%) Tunneling: No Undermining: No Wound Description Classification: Grade 1 Wound Margin: Distinct, outline attached Exudate Amount: Small Exudate Type: Serosanguineous Exudate Color: red, brown Foul Odor  1:01/03/2015] [2:11/09/2014] [3:11/07/2014] Weeks of Treatment: [1:4] [2:4] [3:3] Wound Status: [1:Open] [2:Open] [3:Open] Measurements L x W x D 2x2x0.3 [2:0.1x0.1x0.1] [3:1.1x0.5x0.1] (cm) Area (cm) : [1:3.142] [2:0.008] [3:0.432] Volume (cm) : [1:0.942] [2:0.001] [3:0.043] % Reduction in Area: [1:20.00%] [2:98.40%] [3:21.50%] % Reduction in Volume: -139.70% [2:99.00%] [3:60.90%] Classification: [1:Grade 2] [2:Grade 1] [3:Category/Stage III] Exudate Amount: [1:Large] [2:Small] [3:None Present] Exudate Type: [1:Serosanguineous] [2:Serosanguineous] [3:N/A] Exudate Color: [1:red, brown] [2:red, brown] [3:N/A] Wound Margin: [1:Distinct, outline attached] [2:Distinct, outline attached] [3:Indistinct, nonvisible] Granulation Amount: [1:Medium (34-66%)] [2:None Present (0%)] [3:None Present (0%)] Granulation Quality: [1:Pink] [2:N/A] [3:N/A] Necrotic Amount: [1:Medium (34-66%)] [2:None Present (0%)] [3:Large (67-100%)] Necrotic Tissue: [1:Eschar, Adherent Slough] [2:N/A] [3:Eschar] Exposed  Structures: [1:Fascia: No Fat: No Tendon: No] [2:Fascia: No Fat: No Tendon: No] [3:Fascia: No Fat: No Tendon: No] Muscle: No Muscle: No Muscle: No Joint: No Joint: No Joint: No Bone: No Bone: No Bone: No Limited to Skin Limited to Skin Limited to Skin Breakdown Breakdown Breakdown Epithelialization: Small (1-33%) Small (1-33%) None Periwound Skin Texture: Edema: No Edema: No Edema: No Excoriation: No Excoriation: No Excoriation: No Induration: No Induration: No Induration: No Callus: No Callus: No Callus: No Crepitus: No Crepitus: No Crepitus: No Fluctuance: No Fluctuance: No Fluctuance: No Friable: No Friable: No Friable: No Rash: No Rash: No Rash: No Scarring: No Scarring: No Scarring: No Periwound Skin Maceration: Yes Dry/Scaly: Yes Dry/Scaly: Yes Moisture: Moist: Yes Maceration: No Maceration: No Dry/Scaly: No Moist: No Moist: No Periwound Skin Color: Atrophie Blanche: No Atrophie Blanche: No Atrophie Blanche: No Cyanosis: No Cyanosis: No Cyanosis: No Ecchymosis: No Ecchymosis: No Ecchymosis: No Erythema: No Erythema: No Erythema: No Hemosiderin Staining: No Hemosiderin Staining: No Hemosiderin Staining: No Mottled: No Mottled: No Mottled: No Pallor: No Pallor: No Pallor: No Rubor: No Rubor: No Rubor: No Temperature: No Abnormality No Abnormality No Abnormality Tenderness on Yes No No Palpation: Wound Preparation: Ulcer Cleansing: Ulcer Cleansing: Ulcer Cleansing: Rinsed/Irrigated with Rinsed/Irrigated with Rinsed/Irrigated with Saline Saline Saline Topical Anesthetic Topical Anesthetic Topical Anesthetic Applied: Other: lidocaine Applied: Other: lidocaine Applied: Other: Lidocaine 4% 4% 4% Treatment Notes Electronic Signature(s) Signed: 02/07/2015 4:57:42 PM By: Vickie Dresser RN Entered By: [name] on 02/07/2015 14:12:02 [name]  ([id number]) -------------------------------------------------------------------------------- [name] Details Patient Name: [name] Date of Service: 02/07/2015 1:45 PM Medical Record Number: [id number] Patient Account Number: [id number] Date of Birth/Sex: 08/09/1956 (58 y.o. Female) Treating RN: [name] Primary Care Physician: [name] Other Clinician: Referring Physician: [name] Treating Physician/Extender: Frann Rider in Treatment: 4 Active Inactive Abuse / Safety / Falls / Self Care Management Nursing Diagnoses: Impaired physical mobility Potential for falls Self care deficit: actual or potential Goals: Patient/caregiver will verbalize understanding of skin care regimen Date Initiated: 01/10/2015 Goal Status: Active Patient/caregiver will verbalize/demonstrate measures taken to improve the patient's personal safety Date Initiated: 01/10/2015 Goal Status: Active Patient/caregiver will verbalize/demonstrate measures taken to prevent injury and/or falls Date Initiated: 01/10/2015 Goal Status: Active Patient/caregiver will verbalize/demonstrate understanding of what to do in case of emergency Date Initiated: 01/10/2015 Goal Status: Active Interventions: Assess fall risk on admission and as needed Assess impairment of mobility on admission and as needed per policy Provide education on basic hygiene Provide education on fall prevention Provide education on personal and home safety Provide education on safe transfers Treatment Activities: Education provided on Basic Hygiene : 01/10/2015 Notes: Nutrition [name], [name] ([id number]) Nursing Diagnoses: Impaired glucose control: actual or potential Potential for alteratiion in Nutrition/Potential for imbalanced nutrition Goals: Patient/caregiver  Female) Treating RN: Vickie Gouty, RN, BSN, Velva Harman Primary Care Physician: [name] Other Clinician: Referring Physician: [name] Treating Physician/Extender: Frann Rider in Treatment: 4 Education Assessment Education Provided To: Patient Education Topics Provided Basic Hygiene: Methods: Explain/Verbal Responses: State content correctly Wound/Skin Impairment: Methods: Explain/Verbal Responses: State content correctly Electronic Signature(s) Signed: 02/07/2015 3:22:58 PM By: Regan Lemming BSN, RN Entered By: Regan Lemming on 02/07/2015 14:39:24 [name] ([id number]) -------------------------------------------------------------------------------- Wound Assessment Details Patient Name: [name] Date of Service: 02/07/2015 1:45 PM Medical Record Number: [id number] Patient Account Number: [id number] Date of Birth/Sex: Nov 14, 1955 (58 y.o. Female) Treating RN: Afful, RN, BSN, [place] Primary Care Physician: [name] Other Clinician: Referring Physician: [name] Treating Physician/Extender: Frann Rider in Treatment: 4 Wound Status Wound Number: 1 Primary Diabetic Wound/Ulcer of the Lower Etiology: Extremity Wound Location: Left Calcaneous Wound Open Wounding Event: Gradually Appeared Status: Date  Acquired: 01/03/2015 Comorbid Anemia, Arrhythmia, Congestive Heart Weeks Of Treatment: 4 History: Failure, Hypertension, Type II Diabetes Clustered Wound: No Photos Photo Uploaded By: Regan Lemming on 02/07/2015 15:44:19 Wound Measurements Length: (cm) 2 Width: (cm) 2 Depth: (cm) 0.3 Area: (cm) 3.142 Volume: (cm) 0.942 % Reduction in Area: 20% % Reduction in Volume: -139.7% Epithelialization: Small (1-33%) Tunneling: No Undermining: No Wound Description Classification: Grade 2 Wound Margin: Distinct, outline attached Exudate Amount: Large Exudate Type: Serosanguineous Exudate Color: red, brown Foul Odor After Cleansing: No Wound Bed Granulation Amount: Medium (34-66%) Exposed Structure Granulation Quality: Pink Fascia Exposed: No Necrotic Amount: Medium (34-66%) Fat Layer Exposed: No Necrotic Quality: Eschar, Adherent Slough Tendon Exposed: No [name], [name]. ([id number]) Muscle Exposed: No Joint Exposed: No Bone Exposed: No Limited to Skin Breakdown Periwound Skin Texture Texture Color No Abnormalities Noted: No No Abnormalities Noted: No Callus: No Atrophie Blanche: No Crepitus: No Cyanosis: No Excoriation: No Ecchymosis: No Fluctuance: No Erythema: No Friable: No Hemosiderin Staining: No Induration: No Mottled: No Localized Edema: No Pallor: No Rash: No Rubor: No Scarring: No Temperature / Pain Moisture Temperature: No Abnormality No Abnormalities Noted: No Tenderness on Palpation: Yes Dry / Scaly: No Maceration: Yes Moist: Yes Wound Preparation Ulcer Cleansing: Rinsed/Irrigated with Saline Topical Anesthetic Applied: Other: lidocaine 4%, Treatment Notes Wound #1 (Left Calcaneous) 1. Cleansed with: Clean wound with Normal Saline 4. Dressing Applied: Santyl Ointment 5. Secondary Dressing Applied Gauze and Kerlix/Conform 7. Secured with Tape Notes sage boot at [employer]) Signed: 02/07/2015 3:22:58 PM By: Regan Lemming  BSN, RN Entered By: Regan Lemming on 02/07/2015 14:06:46 [name] ([id number]) -------------------------------------------------------------------------------- Wound Assessment Details Patient Name: [name] Date of Service: 02/07/2015 1:45 PM Medical Record Number: [id number] Patient Account Number: [id number] Date of Birth/Sex: 03-20-1956 (58 y.o. Female) Treating RN: Afful, RN, BSN, [place] Primary Care Physician: [name] Other Clinician: Referring Physician: [name] Treating Physician/Extender: Frann Rider in Treatment: 4 Wound Status Wound Number: 2 Primary Diabetic Wound/Ulcer of the Lower Etiology: Extremity Wound Location: Toe Second - Dorsal Wound Open Wounding Event: Gradually Appeared Status: Date Acquired: 11/09/2014 Comorbid Anemia, Arrhythmia, Congestive Heart Weeks Of Treatment: 4 History: Failure, Hypertension, Type II Diabetes Clustered Wound: No Photos Photo Uploaded By: Regan Lemming on 02/07/2015 15:44:20 Wound Measurements Length: (cm) 0.1 Width: (cm) 0.1 Depth: (cm) 0.1 Area: (cm) 0.008 Volume: (cm) 0.001 % Reduction in Area: 98.4% % Reduction in Volume: 99% Epithelialization: Small (1-33%) Tunneling: No Undermining: No Wound Description Classification: Grade 1 Wound Margin: Distinct, outline attached Exudate Amount: Small Exudate Type: Serosanguineous Exudate Color: red, brown Foul Odor  After Cleansing: No Wound Bed Granulation Amount: None Present (0%) Exposed Structure Necrotic Amount: None Present (0%) Fascia Exposed: No Fat Layer Exposed: No Tendon Exposed: No THI, BLEE. ([id number]) Muscle Exposed: No Joint Exposed: No Bone Exposed: No Limited to Skin Breakdown Periwound Skin Texture Texture Color No Abnormalities Noted: No No Abnormalities Noted: No Callus: No Atrophie Blanche: No Crepitus: No Cyanosis: No Excoriation: No Ecchymosis: No Fluctuance: No Erythema: No Friable:  No Hemosiderin Staining: No Induration: No Mottled: No Localized Edema: No Pallor: No Rash: No Rubor: No Scarring: No Temperature / Pain Moisture Temperature: No Abnormality No Abnormalities Noted: No Dry / Scaly: Yes Maceration: No Moist: No Wound Preparation Ulcer Cleansing: Rinsed/Irrigated with Saline Topical Anesthetic Applied: Other: lidocaine 4%, Treatment Notes Wound #2 (Dorsal Toe Second) 1. Cleansed with: Clean wound with Normal Saline 4. Dressing Applied: Other dressing (specify in notes) Notes betadine paint Electronic Signature(s) Signed: 02/07/2015 3:22:58 PM By: Regan Lemming BSN, RN Entered By: Regan Lemming on 02/07/2015 14:07:14 [name] ([id number]) -------------------------------------------------------------------------------- Wound Assessment Details Patient Name: [name] Date of Service: 02/07/2015 1:45 PM Medical Record Number: [id number] Patient Account Number: [id number] Date of Birth/Sex: 14-Apr-1956 (58 y.o. Female) Treating RN: Afful, RN, BSN, [place] Primary Care Physician: [name] Other Clinician: Referring Physician: [name] Treating Physician/Extender: Frann Rider in Treatment: 4 Wound Status Wound Number: 3 Primary Pressure Ulcer Etiology: Wound Location: Nose Wound Open Wounding Event: Pressure Injury Status: Date Acquired: 11/07/2014 Comorbid Anemia, Arrhythmia, Congestive Heart Weeks Of Treatment: 3 History: Failure, Hypertension, Type II Diabetes Clustered Wound: No Wound Measurements Length: (cm) 1.1 Width: (cm) 0.5 Depth: (cm) 0.1 Area: (cm) 0.432 Volume: (cm) 0.043 % Reduction in Area: 21.5% % Reduction in Volume: 60.9% Epithelialization: None Tunneling: No Undermining: No Wound Description Classification: Category/Stage III Wound Margin: Indistinct, nonvisible Exudate Amount: None Present Foul Odor After Cleansing: No Wound Bed Granulation Amount: None Present (0%) Exposed  Structure Necrotic Amount: Large (67-100%) Fascia Exposed: No Necrotic Quality: Eschar Fat Layer Exposed: No Tendon Exposed: No Muscle Exposed: No Joint Exposed: No Bone Exposed: No Limited to Skin Breakdown Periwound Skin Texture Texture Color No Abnormalities Noted: No No Abnormalities Noted: No Callus: No Atrophie Blanche: No Crepitus: No Cyanosis: No Excoriation: No Ecchymosis: No Fluctuance: No Erythema: No Friable: No Hemosiderin Staining: No Induration: No Mottled: No [name], [name]. ([id number]) Localized Edema: No Pallor: No Rash: No Rubor: No Scarring: No Temperature / Pain Moisture Temperature: No Abnormality No Abnormalities Noted: No Dry / Scaly: Yes Maceration: No Moist: No Wound Preparation Ulcer Cleansing: Rinsed/Irrigated with Saline Topical Anesthetic Applied: Other: Lidocaine 4%, Treatment Notes Wound #3 (Nose) 1. Cleansed with: Clean wound with Normal Saline 4. Dressing Applied: Other dressing (specify in notes) Notes betadine paint Electronic Signature(s) Signed: 02/07/2015 3:22:58 PM By: Regan Lemming BSN, RN Entered By: Regan Lemming on 02/07/2015 14:07:53 [name] ([id number]) -------------------------------------------------------------------------------- Vitals Details Patient Name: [name] Date of Service: 02/07/2015 1:45 PM Medical Record Number: [id number] Patient Account Number: [id number] Date of Birth/Sex: 1955-12-23 (58 y.o. Female) Treating RN: Afful, RN, BSN, [place] Primary Care Physician: [name] Other Clinician: Referring Physician: [name] Treating Physician/Extender: Frann Rider in Treatment: 4 Vital Signs Time Taken: 14:02 Temperature (F): 97.9 Height (in): 68 Pulse (bpm): 80 Weight (lbs): 257 Respiratory Rate (breaths/min): 16 Body Mass Index (BMI): 39.1 Blood Pressure (mmHg): 127/54 Reference Range: 80 - 120 mg / dl Electronic Signature(s) Signed: 02/07/2015 3:22:58 PM  By: Regan Lemming BSN, RN Entered ByBaruch [name],  MARNI, KLAAS ([id number]) Visit Report for 02/07/2015 Arrival Information Details Patient Name: [name], [name] Date of Service: 02/07/2015 1:45 PM Medical Record Number: [id number] Patient Account Number: [id number] Date of Birth/Sex: Feb 05, 1956 (59 y.o. Female) Treating RN: Afful, RN, BSN, [place] Har[place] Primary Care Physician: [name] Other Clinician: Referring Physician: [name] Treating Physician/Extender: Frann Rider in Treatment: 4 Visit Information History Since Last Visit Any new allergies or adverse reactions: No Patient Arrived: Wheel Chair Had a fall or experienced change in No Arrival Time: 13:57 activities of daily living that may affect Accompanied By: husband risk of falls: Transfer Assistance: None Signs or symptoms of abuse/neglect since last No Patient Identification Verified: Yes visito Secondary Verification Process Yes Hospitalized since last visit: No Completed: Has Dressing in Place as Prescribed: Yes Patient Requires Transmission- No Has Footwear/Offloading in Place as Yes Based Precautions: Prescribed: Patient Has Alerts: Yes Left: Wedge Patient Alerts: Patient on Blood Shoe Thinner Pain Present Now: No ABI L:0.8, R:0.93 Electronic Signature(s) Signed: 02/07/2015 3:22:58 PM By: Regan Lemming BSN, RN Entered By: Regan Lemming on 02/07/2015 14:01:56 [name] ([id number]) -------------------------------------------------------------------------------- Encounter Discharge Information Details Patient Name: [name] Date of Service: 02/07/2015 1:45 PM Medical Record Number: [id number] Patient Account Number: [id number] Date of Birth/Sex: 11-23-1955 (58 y.o. Female) Treating RN: Vickie Gouty, RN, BSN, Vel[place] Harman Primary Care Physician: [name] Other Clinician: Referring Physician: [name] Treating Physician/Extender: Frann Rider in Treatment: 4 Encounter Discharge Information Items Discharge Pain Level:  0 Discharge Condition: Stable Ambulatory Status: Wheelchair Discharge Destination: Home Transportation: Private Auto Accompanied By: [name] Schedule Follow-up Appointment: No Medication Reconciliation completed No and provided to Patient/Care [name]: Provided on Clinical Summary of Care: 02/07/2015 Form Type Recipient Paper Patient BG Electronic Signature(s) Signed: 02/07/2015 2:58:11 PM By: Ruthine Dose Entered By: Ruthine Dose on 02/07/2015 14:58:10 [name] ([id number]) -------------------------------------------------------------------------------- Lower Extremity Assessment Details Patient Name: [name] Date of Service: 02/07/2015 1:45 PM Medical Record Number: [id number] Patient Account Number: [id number] Date of Birth/Sex: 05-Jun-1956 (58 y.o. Female) Treating RN: Afful, RN, BSN, [place] Har[place] Primary Care Physician: [name] Other Clinician: Referring Physician: [name] Treating Physician/Extender: Frann Rider in Treatment: 4 Edema Assessment Assessed: [Left: No] [Right: No] Edema: [Left: N] [Right: o] Vascular Assessment Pulses: Posterior Tibial Dorsalis Pedis Palpable: [Left:Yes] Extremity colors, hair growth, and conditions: Extremity Color: [Left:Normal] Hair Growth on Extremity: [Left:No] Capillary Refill: [Left:< 3 seconds] Toe Nail Assessment Left: Right: Thick: No Discolored: No Deformed: No Improper Length and Hygiene: No Electronic Signature(s) Signed: 02/07/2015 3:22:58 PM By: Regan Lemming BSN, RN Entered By: Regan Lemming on 02/07/2015 14:03:01 [name] ([id number]) -------------------------------------------------------------------------------- Multi Wound Chart Details Patient Name: [name] Date of Service: 02/07/2015 1:45 PM Medical Record Number: [id number] Patient Account Number: [id number] Date of Birth/Sex: [birth year] (58 y.o. Female) Treating RN: [name] Primary Care Physician: [name] Other Clinician: Referring Physician: [name] Treating Physician/Extender: Frann Rider in Treatment: 4 Vital Signs Height(in): 68 Pulse(bpm): 80 Weight(lbs): 257 Blood Pressure 127/54 (mmHg): Body Mass Index(BMI): 39 Temperature(F): 97.9 Respiratory Rate 16 (breaths/min): Photos: [1:No Photos] [2:No Photos] [3:No Photos] Wound Location: [1:Left Calcaneous] [2:Toe Second - Dorsal] [3:Nose] Wounding Event: [1:Gradually Appeared] [2:Gradually Appeared] [3:Pressure Injury] Primary Etiology: [1:Diabetic Wound/Ulcer of the Lower Extremity] [2:Diabetic Wound/Ulcer of the Lower Extremity] [3:Pressure Ulcer] Comorbid History: [1:Anemia, Arrhythmia, Congestive Heart Failure, Hypertension, Type II Diabetes] [2:Anemia, Arrhythmia, Congestive Heart Failure, Hypertension, Type II Diabetes] [3:Anemia, Arrhythmia, Congestive Heart Failure, Hypertension, Type II  Diabetes] Date Acquired: [  Female) Treating RN: Vickie Gouty, RN, BSN, [place] Harman Primary Care Physician: [name] Other Clinician: Referring Physician: [name] Treating Physician/Extender: Frann Rider in Treatment: 4 Education Assessment Education Provided To: Patient Education Topics Provided Basic Hygiene: Methods: Explain/Verbal Responses: State content correctly Wound/Skin Impairment: Methods: Explain/Verbal Responses: State content correctly Electronic Signature(s) Signed: 02/07/2015 3:22:58 PM By: Regan Lemming BSN, RN Entered By: Regan Lemming on 02/07/2015 14:39:24 [name] ([id number]) -------------------------------------------------------------------------------- Wound Assessment Details Patient Name: [name] Date of Service: 02/07/2015 1:45 PM Medical Record Number: [id number] Patient Account Number: [id number] Date of Birth/Sex: Nov 14, 1955 (58 y.o. Female) Treating RN: Afful, RN, BSN, [place] Primary Care Physician: [name] Other Clinician: Referring Physician: [name] Treating Physician/Extender: Frann Rider in Treatment: 4 Wound Status Wound Number: 1 Primary Diabetic Wound/Ulcer of the Lower Etiology: Extremity Wound Location: Left Calcaneous Wound Open Wounding Event: Gradually Appeared Status: Date  Acquired: 01/03/2015 Comorbid Anemia, Arrhythmia, Congestive Heart Weeks Of Treatment: 4 History: Failure, Hypertension, Type II Diabetes Clustered Wound: No Photos Photo Uploaded By: Regan Lemming on 02/07/2015 15:44:19 Wound Measurements Length: (cm) 2 Width: (cm) 2 Depth: (cm) 0.3 Area: (cm) 3.142 Volume: (cm) 0.942 % Reduction in Area: 20% % Reduction in Volume: -139.7% Epithelialization: Small (1-33%) Tunneling: No Undermining: No Wound Description Classification: Grade 2 Wound Margin: Distinct, outline attached Exudate Amount: Large Exudate Type: Serosanguineous Exudate Color: red, brown Foul Odor After Cleansing: No Wound Bed Granulation Amount: Medium (34-66%) Exposed Structure Granulation Quality: Pink Fascia Exposed: No Necrotic Amount: Medium (34-66%) Fat Layer Exposed: No Necrotic Quality: Eschar, Adherent Slough Tendon Exposed: No [name], [name]. ([id number]) Muscle Exposed: No Joint Exposed: No Bone Exposed: No Limited to Skin Breakdown Periwound Skin Texture Texture Color No Abnormalities Noted: No No Abnormalities Noted: No Callus: No Atrophie Blanche: No Crepitus: No Cyanosis: No Excoriation: No Ecchymosis: No Fluctuance: No Erythema: No Friable: No Hemosiderin Staining: No Induration: No Mottled: No Localized Edema: No Pallor: No Rash: No Rubor: No Scarring: No Temperature / Pain Moisture Temperature: No Abnormality No Abnormalities Noted: No Tenderness on Palpation: Yes Dry / Scaly: No Maceration: Yes Moist: Yes Wound Preparation Ulcer Cleansing: Rinsed/Irrigated with Saline Topical Anesthetic Applied: Other: lidocaine 4%, Treatment Notes Wound #1 (Left Calcaneous) 1. Cleansed with: Clean wound with Normal Saline 4. Dressing Applied: Santyl Ointment 5. Secondary Dressing Applied Gauze and Kerlix/Conform 7. Secured with Tape Notes sage boot at [employer]) Signed: 02/07/2015 3:22:58 PM By: Regan Lemming  BSN, RN Entered By: Regan Lemming on 02/07/2015 14:06:46 [name] ([id number]) -------------------------------------------------------------------------------- Wound Assessment Details Patient Name: [name] Date of Service: 02/07/2015 1:45 PM Medical Record Number: [id number] Patient Account Number: [id number] Date of Birth/Sex: 03-20-1956 (58 y.o. Female) Treating RN: Afful, RN, BSN, [place] Primary Care Physician: [name] Other Clinician: Referring Physician: [name] Treating Physician/Extender: Frann Rider in Treatment: 4 Wound Status Wound Number: 2 Primary Diabetic Wound/Ulcer of the Lower Etiology: Extremity Wound Location: Toe Second - Dorsal Wound Open Wounding Event: Gradually Appeared Status: Date Acquired: 11/09/2014 Comorbid Anemia, Arrhythmia, Congestive Heart Weeks Of Treatment: 4 History: Failure, Hypertension, Type II Diabetes Clustered Wound: No Photos Photo Uploaded By: Regan Lemming on 02/07/2015 15:44:20 Wound Measurements Length: (cm) 0.1 Width: (cm) 0.1 Depth: (cm) 0.1 Area: (cm) 0.008 Volume: (cm) 0.001 % Reduction in Area: 98.4% % Reduction in Volume: 99% Epithelialization: Small (1-33%) Tunneling: No Undermining: No Wound Description Classification: Grade 1 Wound Margin: Distinct, outline attached Exudate Amount: Small Exudate Type: Serosanguineous Exudate Color: red, brown Foul Odor

## 2015-02-07 NOTE — Telephone Encounter (Signed)
Spoke with Dr. Glenda Chroman of [place] at [facility]; pt with osteomyelitis of heel L.  S/p MRI. Will warrant iv abx via PICC line.  Needs most recent PCP note, cardiology office note, echo, CXR, labs faxed to him at [phone].  Referrals, please fax these notes to him today.  Office number is [phone].

## 2015-02-07 NOTE — Telephone Encounter (Signed)
Letter has been printed.  Please also see if patient has rec'd a call from rheumatology with appointment.  Please fax my clinic note and associated lab results with referral.  Benetta Maclaren K. Posey Pronto, DO

## 2015-02-08 ENCOUNTER — Ambulatory Visit: Payer: PPO | Admitting: Physician Assistant

## 2015-02-09 NOTE — Telephone Encounter (Signed)
Please fax my most recent note and recent CXR, labs, echo, cardiology note to Dr. Glenda Chroman at [facility] [phone].

## 2015-02-09 NOTE — Progress Notes (Signed)
[id number]) -------------------------------------------------------------------------------- Physical Exam Details Patient Name: [name], [name] Date of Service: 02/07/2015 1:45 PM Medical Record Number: [id number] Patient Account Number: [id number] Date of Birth/Sex: 01-Nov-1955 (59 y.o. Female) Treating RN: Primary Care Physician: [name] Other Clinician: Referring Physician: [name] Treating Physician/Extender: Frann Rider in Treatment: 4 Constitutional . Pulse regular. Respirations normal and unlabored. Afebrile. . Eyes Nonicteric. Reactive to light. Ears, Nose, Mouth, and Throat Lips, teeth, and gums WNL.Marland Kitchen Moist mucosa without lesions . Neck supple and nontender. No palpable supraclavicular or cervical adenopathy. Normal sized without goiter. Respiratory WNL. No retractions.. Cardiovascular Pedal Pulses WNL. No clubbing, cyanosis or edema. Integumentary (Hair, Skin) her nose shows a dry eschar and it is healing nicely. Her left second toe has a dry eschar and on removing this there is a good appetite generalized ulceration. Her heel is looking much better than before and after some sharp debridement of the necrotic tissue has been taken out and there is good granulation tissue.Marland Kitchen Psychiatric Judgement and insight Intact.. No evidence of depression, anxiety, or agitation.. Electronic Signature(s) Signed: 02/07/2015 3:57:25 PM By: Christin Fudge MD, FACS Entered By: Christin Fudge on 02/07/2015 14:28:39 [name] ([id number]) -------------------------------------------------------------------------------- Physician Orders Details Patient Name: [name] Date of Service: 02/07/2015 1:45 PM Medical Record Number: [id number] Patient Account Number: [id number] Date of Birth/Sex: 1956/09/23 (58 y.o. Female) Treating RN: Junious Dresser Primary Care Physician: [name] Other Clinician: Referring Physician: [name] Treating Physician/Extender: Frann Rider in Treatment: 4 Verbal / Phone Orders: Yes Clinician: Junious Dresser Read Back and Verified: Yes Diagnosis Coding Wound Cleansing Wound #1 Left Calcaneous o Clean wound with Normal Saline. o May Shower, gently pat wound dry prior to applying new dressing. o May shower with protection. Wound #2 Dorsal Toe Second o Clean wound with Normal Saline. o May Shower, gently pat wound dry prior to applying new dressing. o May shower with protection. Wound #3 Nose o Clean wound with Normal Saline. o May Shower, gently pat wound dry prior to applying new dressing. o May shower with protection. Anesthetic Wound #1 Left Calcaneous o Topical Lidocaine 4% cream applied to wound bed prior to debridement o Hurricaine Topical Anesthetic Spray applied to wound bed prior to debridement Wound #2 Dorsal Toe Second o Topical Lidocaine 4% cream applied to wound bed prior to debridement o Hurricaine Topical Anesthetic Spray applied to wound bed prior to debridement Primary Wound Dressing Wound #1 Left Calcaneous o Santyl Ointment Wound #2 Dorsal Toe Second o Other: - betadine paint Wound #3 Nose o Other: - betadine paint Secondary Dressing [name], [name] ([id number]) Wound #1 Left Calcaneous o Gauze, ABD and Kerlix/Conform Wound #2 Dorsal Toe Second o Gauze, ABD and Kerlix/Conform Dressing Change Frequency Wound #1 Left Calcaneous o Change dressing every day. Wound #2 Dorsal Toe Second o Change dressing every day. Wound #3 Nose o Change dressing every day. Follow-up Appointments Wound #1 Left Calcaneous o Return Appointment in 1 week. Wound #2 Dorsal Toe Second o Return Appointment in 1 week. Wound #3 Nose o Return Appointment in 1 week. Off-Loading Wound #1 Left Calcaneous o Open toe surgical shoe with peg assist. o Heel suspension boot to: - Sage boots o  Other: Hyperbaric Oxygen Therapy Wound #1 Left Calcaneous o Indication: - osteomyelitis o If appropriate for treatment, begin HBOT per protocol: o 2.0 ATA for 90 Minutes without Air Breaks o One treatment per day (delivered Monday through Friday unless otherwise specified in Special Instructions below): o Total #  [name], [name] ([id number]) Visit Report for 02/07/2015 Chief Complaint Document Details Patient Name: [name], [name] Date of Service: 02/07/2015 1:45 PM Medical Record Number: [id number] Patient Account Number: [id number] Date of Birth/Sex: [birth year] (59 y.o. Female) Treating RN: Primary Care Physician: [name] Other Clinician: Referring Physician: [name] Treating Physician/Extender: Frann Rider in Treatment: 4 Information Obtained from: Patient Chief Complaint Patient presents to the wound care center for a consult due non healing wound 59 year old patient who comes with a history of an ulcer on the left second toe for at least about 4 months and a history on her left heel for about 3 weeks. I needed to get an MRI of her left foot but due to her defibrillator this could not be done and hence she is getting get a triple phase bone scan. 01/17/2015 -- she has developed a significant injury to the bridge of the nose where her CPAP machine was fitting. This is a painfull dark spot. Electronic Signature(s) Signed: 02/07/2015 3:57:25 PM By: Christin Fudge MD, FACS Entered By: Christin Fudge on 02/07/2015 14:25:07 [name] ([id number]) -------------------------------------------------------------------------------- Debridement Details Patient Name: [name] Date of Service: 02/07/2015 1:45 PM Medical Record Number: [id number] Patient Account Number: [id number] Date of Birth/Sex: 08-02-1956 (58 y.o. Female) Treating RN: Primary Care Physician: [name] Other Clinician: Referring Physician: [name] Treating Physician/Extender: Frann Rider in Treatment: 4 Debridement Performed for Wound #1 Left Calcaneous Assessment: Performed By: Physician Pat Patrick., MD Debridement: Debridement Pre-procedure Yes Verification/Time Out Taken: Start Time: 14:15 Pain Control: Lidocaine 4% Topical Solution Total Area Debrided (L x 2 (cm)  x 2 (cm) = 4 (cm) W): Tissue and other Viable, Non-Viable, Exudate, Fibrin/Slough, Subcutaneous material debrided: Instrument: Forceps, Scissors Bleeding: Minimum Hemostasis Achieved: Pressure End Time: 14:20 Procedural Pain: 0 Post Procedural Pain: 0 Response to Treatment: Procedure was tolerated well Post Debridement Measurements of Total Wound Length: (cm) 2 Width: (cm) 2 Depth: (cm) 0.9 Volume: (cm) 2.827 Electronic Signature(s) Signed: 02/07/2015 3:57:25 PM By: Christin Fudge MD, FACS Entered By: Christin Fudge on 02/07/2015 14:24:56 [name] ([id number]) -------------------------------------------------------------------------------- HPI Details Patient Name: [name] Date of Service: 02/07/2015 1:45 PM Medical Record Number: [id number] Patient Account Number: [id number] Date of Birth/Sex: 28-Mar-1956 (58 y.o. Female) Treating RN: Primary Care Physician: [name] Other Clinician: Referring Physician: [name] Treating Physician/Extender: Frann Rider in Treatment: 4 History of Present Illness HPI Description: 58 year old patient who is known to have diabetes for several years and generally has it under good control last hemoglobin A1c was 6.7 done 2 weeks ago. She has several other comorbidities including acute CHF, hypoxia, hyperlipidemia, systolic heart failure, myocardial infarction, coronary artery disease, iron deficiency anemia, renal insufficiency, essential hypertension, morbid obesity and obstructive sleep apnea. 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 In the past she's had a coronary angioplasty with stent in 2013, laparoscopic cholecystectomy in 2011, bilateral oophorectomy in April 2012 and she's had some lumbar disc surgery in 1996. She says she's had a lot of peripheral neuropathy and due to this she's had a ulcer on her left second toe for a while. She had a dry eschar on  her left heel and recently this was removed and she found that she had some infection and some drainage from there. She has minimal pain around the heel but no pain on any areas of her toes. she has not been on any antibiotics recently and has known vascular  [name], [name] ([id number]) Visit Report for 02/07/2015 Chief Complaint Document Details Patient Name: [name], [name] Date of Service: 02/07/2015 1:45 PM Medical Record Number: [id number] Patient Account Number: [id number] Date of Birth/Sex: [birth year] (59 y.o. Female) Treating RN: Primary Care Physician: [name] Other Clinician: Referring Physician: [name] Treating Physician/Extender: Frann Rider in Treatment: 4 Information Obtained from: Patient Chief Complaint Patient presents to the wound care center for a consult due non healing wound 59 year old patient who comes with a history of an ulcer on the left second toe for at least about 4 months and a history on her left heel for about 3 weeks. I needed to get an MRI of her left foot but due to her defibrillator this could not be done and hence she is getting get a triple phase bone scan. 01/17/2015 -- she has developed a significant injury to the bridge of the nose where her CPAP machine was fitting. This is a painfull dark spot. Electronic Signature(s) Signed: 02/07/2015 3:57:25 PM By: Christin Fudge MD, FACS Entered By: Christin Fudge on 02/07/2015 14:25:07 [name] ([id number]) -------------------------------------------------------------------------------- Debridement Details Patient Name: [name] Date of Service: 02/07/2015 1:45 PM Medical Record Number: [id number] Patient Account Number: [id number] Date of Birth/Sex: 08-02-1956 (58 y.o. Female) Treating RN: Primary Care Physician: [name] Other Clinician: Referring Physician: [name] Treating Physician/Extender: Frann Rider in Treatment: 4 Debridement Performed for Wound #1 Left Calcaneous Assessment: Performed By: Physician Pat Patrick., MD Debridement: Debridement Pre-procedure Yes Verification/Time Out Taken: Start Time: 14:15 Pain Control: Lidocaine 4% Topical Solution Total Area Debrided (L x 2 (cm)  x 2 (cm) = 4 (cm) W): Tissue and other Viable, Non-Viable, Exudate, Fibrin/Slough, Subcutaneous material debrided: Instrument: Forceps, Scissors Bleeding: Minimum Hemostasis Achieved: Pressure End Time: 14:20 Procedural Pain: 0 Post Procedural Pain: 0 Response to Treatment: Procedure was tolerated well Post Debridement Measurements of Total Wound Length: (cm) 2 Width: (cm) 2 Depth: (cm) 0.9 Volume: (cm) 2.827 Electronic Signature(s) Signed: 02/07/2015 3:57:25 PM By: Christin Fudge MD, FACS Entered By: Christin Fudge on 02/07/2015 14:24:56 [name] ([id number]) -------------------------------------------------------------------------------- HPI Details Patient Name: [name] Date of Service: 02/07/2015 1:45 PM Medical Record Number: [id number] Patient Account Number: [id number] Date of Birth/Sex: 28-Mar-1956 (58 y.o. Female) Treating RN: Primary Care Physician: [name] Other Clinician: Referring Physician: [name] Treating Physician/Extender: Frann Rider in Treatment: 4 History of Present Illness HPI Description: 58 year old patient who is known to have diabetes for several years and generally has it under good control last hemoglobin A1c was 6.7 done 2 weeks ago. She has several other comorbidities including acute CHF, hypoxia, hyperlipidemia, systolic heart failure, myocardial infarction, coronary artery disease, iron deficiency anemia, renal insufficiency, essential hypertension, morbid obesity and obstructive sleep apnea. 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 In the past she's had a coronary angioplasty with stent in 2013, laparoscopic cholecystectomy in 2011, bilateral oophorectomy in April 2012 and she's had some lumbar disc surgery in 1996. She says she's had a lot of peripheral neuropathy and due to this she's had a ulcer on her left second toe for a while. She had a dry eschar on  her left heel and recently this was removed and she found that she had some infection and some drainage from there. She has minimal pain around the heel but no pain on any areas of her toes. she has not been on any antibiotics recently and has known vascular  [name], [name] ([id number]) Visit Report for 02/07/2015 Chief Complaint Document Details Patient Name: [name], [name] Date of Service: 02/07/2015 1:45 PM Medical Record Number: [id number] Patient Account Number: [id number] Date of Birth/Sex: [birth year] (59 y.o. Female) Treating RN: Primary Care Physician: [name] Other Clinician: Referring Physician: [name] Treating Physician/Extender: Frann Rider in Treatment: 4 Information Obtained from: Patient Chief Complaint Patient presents to the wound care center for a consult due non healing wound 59 year old patient who comes with a history of an ulcer on the left second toe for at least about 4 months and a history on her left heel for about 3 weeks. I needed to get an MRI of her left foot but due to her defibrillator this could not be done and hence she is getting get a triple phase bone scan. 01/17/2015 -- she has developed a significant injury to the bridge of the nose where her CPAP machine was fitting. This is a painfull dark spot. Electronic Signature(s) Signed: 02/07/2015 3:57:25 PM By: Christin Fudge MD, FACS Entered By: Christin Fudge on 02/07/2015 14:25:07 [name] ([id number]) -------------------------------------------------------------------------------- Debridement Details Patient Name: [name] Date of Service: 02/07/2015 1:45 PM Medical Record Number: [id number] Patient Account Number: [id number] Date of Birth/Sex: 08-02-1956 (58 y.o. Female) Treating RN: Primary Care Physician: [name] Other Clinician: Referring Physician: [name] Treating Physician/Extender: Frann Rider in Treatment: 4 Debridement Performed for Wound #1 Left Calcaneous Assessment: Performed By: Physician Pat Patrick., MD Debridement: Debridement Pre-procedure Yes Verification/Time Out Taken: Start Time: 14:15 Pain Control: Lidocaine 4% Topical Solution Total Area Debrided (L x 2 (cm)  x 2 (cm) = 4 (cm) W): Tissue and other Viable, Non-Viable, Exudate, Fibrin/Slough, Subcutaneous material debrided: Instrument: Forceps, Scissors Bleeding: Minimum Hemostasis Achieved: Pressure End Time: 14:20 Procedural Pain: 0 Post Procedural Pain: 0 Response to Treatment: Procedure was tolerated well Post Debridement Measurements of Total Wound Length: (cm) 2 Width: (cm) 2 Depth: (cm) 0.9 Volume: (cm) 2.827 Electronic Signature(s) Signed: 02/07/2015 3:57:25 PM By: Christin Fudge MD, FACS Entered By: Christin Fudge on 02/07/2015 14:24:56 [name] ([id number]) -------------------------------------------------------------------------------- HPI Details Patient Name: [name] Date of Service: 02/07/2015 1:45 PM Medical Record Number: [id number] Patient Account Number: [id number] Date of Birth/Sex: 28-Mar-1956 (58 y.o. Female) Treating RN: Primary Care Physician: [name] Other Clinician: Referring Physician: [name] Treating Physician/Extender: Frann Rider in Treatment: 4 History of Present Illness HPI Description: 58 year old patient who is known to have diabetes for several years and generally has it under good control last hemoglobin A1c was 6.7 done 2 weeks ago. She has several other comorbidities including acute CHF, hypoxia, hyperlipidemia, systolic heart failure, myocardial infarction, coronary artery disease, iron deficiency anemia, renal insufficiency, essential hypertension, morbid obesity and obstructive sleep apnea. 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 In the past she's had a coronary angioplasty with stent in 2013, laparoscopic cholecystectomy in 2011, bilateral oophorectomy in April 2012 and she's had some lumbar disc surgery in 1996. She says she's had a lot of peripheral neuropathy and due to this she's had a ulcer on her left second toe for a while. She had a dry eschar on  her left heel and recently this was removed and she found that she had some infection and some drainage from there. She has minimal pain around the heel but no pain on any areas of her toes. she has not been on any antibiotics recently and has known vascular  [name], [name] ([id number]) Visit Report for 02/07/2015 Chief Complaint Document Details Patient Name: [name], [name] Date of Service: 02/07/2015 1:45 PM Medical Record Number: [id number] Patient Account Number: [id number] Date of Birth/Sex: [birth year] (59 y.o. Female) Treating RN: Primary Care Physician: [name] Other Clinician: Referring Physician: [name] Treating Physician/Extender: Frann Rider in Treatment: 4 Information Obtained from: Patient Chief Complaint Patient presents to the wound care center for a consult due non healing wound 59 year old patient who comes with a history of an ulcer on the left second toe for at least about 4 months and a history on her left heel for about 3 weeks. I needed to get an MRI of her left foot but due to her defibrillator this could not be done and hence she is getting get a triple phase bone scan. 01/17/2015 -- she has developed a significant injury to the bridge of the nose where her CPAP machine was fitting. This is a painfull dark spot. Electronic Signature(s) Signed: 02/07/2015 3:57:25 PM By: Christin Fudge MD, FACS Entered By: Christin Fudge on 02/07/2015 14:25:07 [name] ([id number]) -------------------------------------------------------------------------------- Debridement Details Patient Name: [name] Date of Service: 02/07/2015 1:45 PM Medical Record Number: [id number] Patient Account Number: [id number] Date of Birth/Sex: 08-02-1956 (58 y.o. Female) Treating RN: Primary Care Physician: [name] Other Clinician: Referring Physician: [name] Treating Physician/Extender: Frann Rider in Treatment: 4 Debridement Performed for Wound #1 Left Calcaneous Assessment: Performed By: Physician Pat Patrick., MD Debridement: Debridement Pre-procedure Yes Verification/Time Out Taken: Start Time: 14:15 Pain Control: Lidocaine 4% Topical Solution Total Area Debrided (L x 2 (cm)  x 2 (cm) = 4 (cm) W): Tissue and other Viable, Non-Viable, Exudate, Fibrin/Slough, Subcutaneous material debrided: Instrument: Forceps, Scissors Bleeding: Minimum Hemostasis Achieved: Pressure End Time: 14:20 Procedural Pain: 0 Post Procedural Pain: 0 Response to Treatment: Procedure was tolerated well Post Debridement Measurements of Total Wound Length: (cm) 2 Width: (cm) 2 Depth: (cm) 0.9 Volume: (cm) 2.827 Electronic Signature(s) Signed: 02/07/2015 3:57:25 PM By: Christin Fudge MD, FACS Entered By: Christin Fudge on 02/07/2015 14:24:56 [name] ([id number]) -------------------------------------------------------------------------------- HPI Details Patient Name: [name] Date of Service: 02/07/2015 1:45 PM Medical Record Number: [id number] Patient Account Number: [id number] Date of Birth/Sex: 28-Mar-1956 (58 y.o. Female) Treating RN: Primary Care Physician: [name] Other Clinician: Referring Physician: [name] Treating Physician/Extender: Frann Rider in Treatment: 4 History of Present Illness HPI Description: 58 year old patient who is known to have diabetes for several years and generally has it under good control last hemoglobin A1c was 6.7 done 2 weeks ago. She has several other comorbidities including acute CHF, hypoxia, hyperlipidemia, systolic heart failure, myocardial infarction, coronary artery disease, iron deficiency anemia, renal insufficiency, essential hypertension, morbid obesity and obstructive sleep apnea. 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 In the past she's had a coronary angioplasty with stent in 2013, laparoscopic cholecystectomy in 2011, bilateral oophorectomy in April 2012 and she's had some lumbar disc surgery in 1996. She says she's had a lot of peripheral neuropathy and due to this she's had a ulcer on her left second toe for a while. She had a dry eschar on  her left heel and recently this was removed and she found that she had some infection and some drainage from there. She has minimal pain around the heel but no pain on any areas of her toes. she has not been on any antibiotics recently and has known vascular  [id number]) -------------------------------------------------------------------------------- Physical Exam Details Patient Name: [name], [name] Date of Service: 02/07/2015 1:45 PM Medical Record Number: [id number] Patient Account Number: [id number] Date of Birth/Sex: 01-Nov-1955 (59 y.o. Female) Treating RN: Primary Care Physician: [name] Other Clinician: Referring Physician: [name] Treating Physician/Extender: Frann Rider in Treatment: 4 Constitutional . Pulse regular. Respirations normal and unlabored. Afebrile. . Eyes Nonicteric. Reactive to light. Ears, Nose, Mouth, and Throat Lips, teeth, and gums WNL.Marland Kitchen Moist mucosa without lesions . Neck supple and nontender. No palpable supraclavicular or cervical adenopathy. Normal sized without goiter. Respiratory WNL. No retractions.. Cardiovascular Pedal Pulses WNL. No clubbing, cyanosis or edema. Integumentary (Hair, Skin) her nose shows a dry eschar and it is healing nicely. Her left second toe has a dry eschar and on removing this there is a good appetite generalized ulceration. Her heel is looking much better than before and after some sharp debridement of the necrotic tissue has been taken out and there is good granulation tissue.Marland Kitchen Psychiatric Judgement and insight Intact.. No evidence of depression, anxiety, or agitation.. Electronic Signature(s) Signed: 02/07/2015 3:57:25 PM By: Christin Fudge MD, FACS Entered By: Christin Fudge on 02/07/2015 14:28:39 [name] ([id number]) -------------------------------------------------------------------------------- Physician Orders Details Patient Name: [name] Date of Service: 02/07/2015 1:45 PM Medical Record Number: [id number] Patient Account Number: [id number] Date of Birth/Sex: 1956/09/23 (58 y.o. Female) Treating RN: Junious Dresser Primary Care Physician: [name] Other Clinician: Referring Physician: [name] Treating Physician/Extender: Frann Rider in Treatment: 4 Verbal / Phone Orders: Yes Clinician: Junious Dresser Read Back and Verified: Yes Diagnosis Coding Wound Cleansing Wound #1 Left Calcaneous o Clean wound with Normal Saline. o May Shower, gently pat wound dry prior to applying new dressing. o May shower with protection. Wound #2 Dorsal Toe Second o Clean wound with Normal Saline. o May Shower, gently pat wound dry prior to applying new dressing. o May shower with protection. Wound #3 Nose o Clean wound with Normal Saline. o May Shower, gently pat wound dry prior to applying new dressing. o May shower with protection. Anesthetic Wound #1 Left Calcaneous o Topical Lidocaine 4% cream applied to wound bed prior to debridement o Hurricaine Topical Anesthetic Spray applied to wound bed prior to debridement Wound #2 Dorsal Toe Second o Topical Lidocaine 4% cream applied to wound bed prior to debridement o Hurricaine Topical Anesthetic Spray applied to wound bed prior to debridement Primary Wound Dressing Wound #1 Left Calcaneous o Santyl Ointment Wound #2 Dorsal Toe Second o Other: - betadine paint Wound #3 Nose o Other: - betadine paint Secondary Dressing [name], [name] ([id number]) Wound #1 Left Calcaneous o Gauze, ABD and Kerlix/Conform Wound #2 Dorsal Toe Second o Gauze, ABD and Kerlix/Conform Dressing Change Frequency Wound #1 Left Calcaneous o Change dressing every day. Wound #2 Dorsal Toe Second o Change dressing every day. Wound #3 Nose o Change dressing every day. Follow-up Appointments Wound #1 Left Calcaneous o Return Appointment in 1 week. Wound #2 Dorsal Toe Second o Return Appointment in 1 week. Wound #3 Nose o Return Appointment in 1 week. Off-Loading Wound #1 Left Calcaneous o Open toe surgical shoe with peg assist. o Heel suspension boot to: - Sage boots o  Other: Hyperbaric Oxygen Therapy Wound #1 Left Calcaneous o Indication: - osteomyelitis o If appropriate for treatment, begin HBOT per protocol: o 2.0 ATA for 90 Minutes without Air Breaks o One treatment per day (delivered Monday through Friday unless otherwise specified in Special Instructions below): o Total #  [id number]) -------------------------------------------------------------------------------- Physical Exam Details Patient Name: [name], [name] Date of Service: 02/07/2015 1:45 PM Medical Record Number: [id number] Patient Account Number: [id number] Date of Birth/Sex: 01-Nov-1955 (59 y.o. Female) Treating RN: Primary Care Physician: [name] Other Clinician: Referring Physician: [name] Treating Physician/Extender: Frann Rider in Treatment: 4 Constitutional . Pulse regular. Respirations normal and unlabored. Afebrile. . Eyes Nonicteric. Reactive to light. Ears, Nose, Mouth, and Throat Lips, teeth, and gums WNL.Marland Kitchen Moist mucosa without lesions . Neck supple and nontender. No palpable supraclavicular or cervical adenopathy. Normal sized without goiter. Respiratory WNL. No retractions.. Cardiovascular Pedal Pulses WNL. No clubbing, cyanosis or edema. Integumentary (Hair, Skin) her nose shows a dry eschar and it is healing nicely. Her left second toe has a dry eschar and on removing this there is a good appetite generalized ulceration. Her heel is looking much better than before and after some sharp debridement of the necrotic tissue has been taken out and there is good granulation tissue.Marland Kitchen Psychiatric Judgement and insight Intact.. No evidence of depression, anxiety, or agitation.. Electronic Signature(s) Signed: 02/07/2015 3:57:25 PM By: Christin Fudge MD, FACS Entered By: Christin Fudge on 02/07/2015 14:28:39 [name] ([id number]) -------------------------------------------------------------------------------- Physician Orders Details Patient Name: [name] Date of Service: 02/07/2015 1:45 PM Medical Record Number: [id number] Patient Account Number: [id number] Date of Birth/Sex: 1956/09/23 (58 y.o. Female) Treating RN: Junious Dresser Primary Care Physician: [name] Other Clinician: Referring Physician: [name] Treating Physician/Extender: Frann Rider in Treatment: 4 Verbal / Phone Orders: Yes Clinician: Junious Dresser Read Back and Verified: Yes Diagnosis Coding Wound Cleansing Wound #1 Left Calcaneous o Clean wound with Normal Saline. o May Shower, gently pat wound dry prior to applying new dressing. o May shower with protection. Wound #2 Dorsal Toe Second o Clean wound with Normal Saline. o May Shower, gently pat wound dry prior to applying new dressing. o May shower with protection. Wound #3 Nose o Clean wound with Normal Saline. o May Shower, gently pat wound dry prior to applying new dressing. o May shower with protection. Anesthetic Wound #1 Left Calcaneous o Topical Lidocaine 4% cream applied to wound bed prior to debridement o Hurricaine Topical Anesthetic Spray applied to wound bed prior to debridement Wound #2 Dorsal Toe Second o Topical Lidocaine 4% cream applied to wound bed prior to debridement o Hurricaine Topical Anesthetic Spray applied to wound bed prior to debridement Primary Wound Dressing Wound #1 Left Calcaneous o Santyl Ointment Wound #2 Dorsal Toe Second o Other: - betadine paint Wound #3 Nose o Other: - betadine paint Secondary Dressing [name], [name] ([id number]) Wound #1 Left Calcaneous o Gauze, ABD and Kerlix/Conform Wound #2 Dorsal Toe Second o Gauze, ABD and Kerlix/Conform Dressing Change Frequency Wound #1 Left Calcaneous o Change dressing every day. Wound #2 Dorsal Toe Second o Change dressing every day. Wound #3 Nose o Change dressing every day. Follow-up Appointments Wound #1 Left Calcaneous o Return Appointment in 1 week. Wound #2 Dorsal Toe Second o Return Appointment in 1 week. Wound #3 Nose o Return Appointment in 1 week. Off-Loading Wound #1 Left Calcaneous o Open toe surgical shoe with peg assist. o Heel suspension boot to: - Sage boots o  Other: Hyperbaric Oxygen Therapy Wound #1 Left Calcaneous o Indication: - osteomyelitis o If appropriate for treatment, begin HBOT per protocol: o 2.0 ATA for 90 Minutes without Air Breaks o One treatment per day (delivered Monday through Friday unless otherwise specified in Special Instructions below): o Total #  [name], [name] ([id number]) Visit Report for 02/07/2015 Chief Complaint Document Details Patient Name: [name], [name] Date of Service: 02/07/2015 1:45 PM Medical Record Number: [id number] Patient Account Number: [id number] Date of Birth/Sex: [birth year] (59 y.o. Female) Treating RN: Primary Care Physician: [name] Other Clinician: Referring Physician: [name] Treating Physician/Extender: Frann Rider in Treatment: 4 Information Obtained from: Patient Chief Complaint Patient presents to the wound care center for a consult due non healing wound 59 year old patient who comes with a history of an ulcer on the left second toe for at least about 4 months and a history on her left heel for about 3 weeks. I needed to get an MRI of her left foot but due to her defibrillator this could not be done and hence she is getting get a triple phase bone scan. 01/17/2015 -- she has developed a significant injury to the bridge of the nose where her CPAP machine was fitting. This is a painfull dark spot. Electronic Signature(s) Signed: 02/07/2015 3:57:25 PM By: Christin Fudge MD, FACS Entered By: Christin Fudge on 02/07/2015 14:25:07 [name] ([id number]) -------------------------------------------------------------------------------- Debridement Details Patient Name: [name] Date of Service: 02/07/2015 1:45 PM Medical Record Number: [id number] Patient Account Number: [id number] Date of Birth/Sex: 08-02-1956 (58 y.o. Female) Treating RN: Primary Care Physician: [name] Other Clinician: Referring Physician: [name] Treating Physician/Extender: Frann Rider in Treatment: 4 Debridement Performed for Wound #1 Left Calcaneous Assessment: Performed By: Physician Pat Patrick., MD Debridement: Debridement Pre-procedure Yes Verification/Time Out Taken: Start Time: 14:15 Pain Control: Lidocaine 4% Topical Solution Total Area Debrided (L x 2 (cm)  x 2 (cm) = 4 (cm) W): Tissue and other Viable, Non-Viable, Exudate, Fibrin/Slough, Subcutaneous material debrided: Instrument: Forceps, Scissors Bleeding: Minimum Hemostasis Achieved: Pressure End Time: 14:20 Procedural Pain: 0 Post Procedural Pain: 0 Response to Treatment: Procedure was tolerated well Post Debridement Measurements of Total Wound Length: (cm) 2 Width: (cm) 2 Depth: (cm) 0.9 Volume: (cm) 2.827 Electronic Signature(s) Signed: 02/07/2015 3:57:25 PM By: Christin Fudge MD, FACS Entered By: Christin Fudge on 02/07/2015 14:24:56 [name] ([id number]) -------------------------------------------------------------------------------- HPI Details Patient Name: [name] Date of Service: 02/07/2015 1:45 PM Medical Record Number: [id number] Patient Account Number: [id number] Date of Birth/Sex: 28-Mar-1956 (58 y.o. Female) Treating RN: Primary Care Physician: [name] Other Clinician: Referring Physician: [name] Treating Physician/Extender: Frann Rider in Treatment: 4 History of Present Illness HPI Description: 58 year old patient who is known to have diabetes for several years and generally has it under good control last hemoglobin A1c was 6.7 done 2 weeks ago. She has several other comorbidities including acute CHF, hypoxia, hyperlipidemia, systolic heart failure, myocardial infarction, coronary artery disease, iron deficiency anemia, renal insufficiency, essential hypertension, morbid obesity and obstructive sleep apnea. 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 In the past she's had a coronary angioplasty with stent in 2013, laparoscopic cholecystectomy in 2011, bilateral oophorectomy in April 2012 and she's had some lumbar disc surgery in 1996. She says she's had a lot of peripheral neuropathy and due to this she's had a ulcer on her left second toe for a while. She had a dry eschar on  her left heel and recently this was removed and she found that she had some infection and some drainage from there. She has minimal pain around the heel but no pain on any areas of her toes. she has not been on any antibiotics recently and has known vascular  [name], [name] ([id number]) Visit Report for 02/07/2015 Chief Complaint Document Details Patient Name: [name], [name] Date of Service: 02/07/2015 1:45 PM Medical Record Number: [id number] Patient Account Number: [id number] Date of Birth/Sex: [birth year] (59 y.o. Female) Treating RN: Primary Care Physician: [name] Other Clinician: Referring Physician: [name] Treating Physician/Extender: Frann Rider in Treatment: 4 Information Obtained from: Patient Chief Complaint Patient presents to the wound care center for a consult due non healing wound 59 year old patient who comes with a history of an ulcer on the left second toe for at least about 4 months and a history on her left heel for about 3 weeks. I needed to get an MRI of her left foot but due to her defibrillator this could not be done and hence she is getting get a triple phase bone scan. 01/17/2015 -- she has developed a significant injury to the bridge of the nose where her CPAP machine was fitting. This is a painfull dark spot. Electronic Signature(s) Signed: 02/07/2015 3:57:25 PM By: Christin Fudge MD, FACS Entered By: Christin Fudge on 02/07/2015 14:25:07 [name] ([id number]) -------------------------------------------------------------------------------- Debridement Details Patient Name: [name] Date of Service: 02/07/2015 1:45 PM Medical Record Number: [id number] Patient Account Number: [id number] Date of Birth/Sex: 08-02-1956 (58 y.o. Female) Treating RN: Primary Care Physician: [name] Other Clinician: Referring Physician: [name] Treating Physician/Extender: Frann Rider in Treatment: 4 Debridement Performed for Wound #1 Left Calcaneous Assessment: Performed By: Physician Pat Patrick., MD Debridement: Debridement Pre-procedure Yes Verification/Time Out Taken: Start Time: 14:15 Pain Control: Lidocaine 4% Topical Solution Total Area Debrided (L x 2 (cm)  x 2 (cm) = 4 (cm) W): Tissue and other Viable, Non-Viable, Exudate, Fibrin/Slough, Subcutaneous material debrided: Instrument: Forceps, Scissors Bleeding: Minimum Hemostasis Achieved: Pressure End Time: 14:20 Procedural Pain: 0 Post Procedural Pain: 0 Response to Treatment: Procedure was tolerated well Post Debridement Measurements of Total Wound Length: (cm) 2 Width: (cm) 2 Depth: (cm) 0.9 Volume: (cm) 2.827 Electronic Signature(s) Signed: 02/07/2015 3:57:25 PM By: Christin Fudge MD, FACS Entered By: Christin Fudge on 02/07/2015 14:24:56 [name] ([id number]) -------------------------------------------------------------------------------- HPI Details Patient Name: [name] Date of Service: 02/07/2015 1:45 PM Medical Record Number: [id number] Patient Account Number: [id number] Date of Birth/Sex: 28-Mar-1956 (58 y.o. Female) Treating RN: Primary Care Physician: [name] Other Clinician: Referring Physician: [name] Treating Physician/Extender: Frann Rider in Treatment: 4 History of Present Illness HPI Description: 58 year old patient who is known to have diabetes for several years and generally has it under good control last hemoglobin A1c was 6.7 done 2 weeks ago. She has several other comorbidities including acute CHF, hypoxia, hyperlipidemia, systolic heart failure, myocardial infarction, coronary artery disease, iron deficiency anemia, renal insufficiency, essential hypertension, morbid obesity and obstructive sleep apnea. 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 In the past she's had a coronary angioplasty with stent in 2013, laparoscopic cholecystectomy in 2011, bilateral oophorectomy in April 2012 and she's had some lumbar disc surgery in 1996. She says she's had a lot of peripheral neuropathy and due to this she's had a ulcer on her left second toe for a while. She had a dry eschar on  her left heel and recently this was removed and she found that she had some infection and some drainage from there. She has minimal pain around the heel but no pain on any areas of her toes. she has not been on any antibiotics recently and has known vascular

## 2015-02-09 NOTE — Telephone Encounter (Signed)
Faxed all of notes.

## 2015-02-10 ENCOUNTER — Ambulatory Visit
Admission: RE | Admit: 2015-02-10 | Discharge: 2015-02-10 | Disposition: A | Discharge status: Home / Self Care | Payer: PPO | Source: Ambulatory Visit | Attending: Vascular Surgery | Admitting: Vascular Surgery

## 2015-02-10 ENCOUNTER — Encounter
Admission: RE | Disposition: A | Discharge status: Home / Self Care | Payer: PPO | Source: Ambulatory Visit | Attending: Vascular Surgery

## 2015-02-10 DIAGNOSIS — M869 Osteomyelitis, unspecified: Secondary | ICD-10-CM | POA: Diagnosis present

## 2015-02-10 DIAGNOSIS — L97429 Non-pressure chronic ulcer of left heel and midfoot with unspecified severity: Secondary | ICD-10-CM | POA: Diagnosis not present

## 2015-02-10 HISTORY — PX: PERIPHERAL VASCULAR CATHETERIZATION: SHX172C

## 2015-02-10 SURGERY — PICC LINE INSERTION
Anesthesia: LOCAL

## 2015-02-10 MED ORDER — VANCOMYCIN HCL IN DEXTROSE 1-5 GM/200ML-% IV SOLN
1000.0000 mg | INTRAVENOUS | Status: DC
  Filled 2015-02-10: qty 200

## 2015-02-10 MED ORDER — SODIUM CHLORIDE 0.9 % IJ SOLN
INTRAMUSCULAR | Status: AC
  Filled 2015-02-10: qty 3

## 2015-02-10 SURGICAL SUPPLY — 3 items
KIT PICC 4FX80 SGL (MISCELLANEOUS) ×1 IMPLANT
KIT PICC 4X65 INSERT (MISCELLANEOUS) ×1 IMPLANT
TOWEL OR 17X26 4PK STRL BLUE (TOWEL DISPOSABLE) ×1 IMPLANT

## 2015-02-11 NOTE — Op Note (Signed)
OPERATIVE NOTE   PROCEDURE: 1. Insertion of a PICC line with ultrasound and fluoroscopic guidance  PRE-OPERATIVE DIAGNOSIS: Osteomyelitis left heel  POST-OPERATIVE DIAGNOSIS: Same  SURGEON: Katha Cabal M.D.  ANESTHESIA: Local 1% lidocaine  ESTIMATED BLOOD LOSS: Minimal  INDICATIONS:   [name] is a 59 y.o. female who presents with ulceration of the left heel. Osteomyelitis has been confirmed. Patient will require prolonged antibiotics..  DESCRIPTION: After obtaining full informed written consent, the patient was brought back to  to special procedures.  The right arm was sterilely prepped and draped, a sterile surgical field was created. The basilic vein was accessed under direct ultrasound guidance without difficulty utilizing a micropuncture needle and a 0.018 wire was then advanced into the superior vena cava. A permanent image was recorded. Peel-away sheath was then placed over the wire. A single lumen peripherally inserted central venous catheter was then advanced over the wire and the wire and peel-away sheath were removed. The catheter tip was then verified to be in the superior vena cava and the catheter was secured to the skin at 32 cm with a sterile dressing including a Biopatch.  The catheter aspirates easily and flushes without difficulty. It is packed with heparinized saline.  Patient tolerated procedure well there were no complications.  COMPLICATIONS: There were no complications  CONDITION: Unchanged  Katha Cabal, M.D. [facility] [phone]   02/11/2015, 5:47 PM

## 2015-02-13 ENCOUNTER — Encounter: Payer: PPO | Admitting: Surgery

## 2015-02-13 DIAGNOSIS — L97422 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 layer exposed: Secondary | ICD-10-CM | POA: Diagnosis not present

## 2015-02-13 LAB — GLUCOSE, CAPILLARY
GLUCOSE-CAPILLARY: 233 mg/dL — AB (ref 70–99)
Glucose-Capillary: 242 mg/dL — ABNORMAL HIGH (ref 70–99)

## 2015-02-13 NOTE — Progress Notes (Signed)
[name], [name] ([id number]) Visit Report for 02/13/2015 Problem List Details Patient Name: [name], [name] Date of Service: 02/13/2015 1:00 PM Medical Record Number: [id number] Patient Account Number: [id number] Date of Birth/Sex: 27-Mar-1956 (59 y.o. 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 01/10/2015 Yes layer exposed L97.522 Non-pressure chronic ulcer of other part of left foot with fat 01/10/2015 Yes layer exposed XX123456 Chronic systolic (congestive) heart failure 01/10/2015 Yes E11.42 Type 2 diabetes mellitus with diabetic polyneuropathy 01/10/2015 Yes S00.33XA Contusion of nose, initial encounter 01/17/2015 Yes 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 layer  exposed L97.522 Non-pressure chronic ulcer of other part of left foot with fat layer exposed XX123456 Chronic systolic (congestive) heart failure E11.42 Type 2 diabetes mellitus with diabetic polyneuropathy S00.33XA Contusion of nose, initial encounter 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, left ankle and foot Facility Procedures CPT4 Code: [id number] Description: (Facility Use Only) HBOT, full body chamber, 77min Modifier: Quantity: 4 Physician Procedures CPT4 Code: [id number] Description: [id number] - WC PHYS HYPERBARIC OXYGEN THERAPY ICD-10 Description Diagnosis 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, left ankle and foo Modifier: t Quantity: 1 Electronic Signature(s) Signed: 02/13/2015 5:45:58 PM By: Gretta Cool, RN, BSN, Kim RN, BSN Previous Signature: 02/13/2015 4:24:42 PM Version By: Lorine Bears RCP, RRT, CHT Previous Signature: 02/13/2015 5:02:53 PM Version By: Christin Fudge MD, FACS Entered By: Gretta Cool RN, BSN, Kim on 02/13/2015 17:44:35

## 2015-02-13 NOTE — Progress Notes (Signed)
LEKISHA, NOBBS ([id number]) Visit Report for 02/13/2015 HBO Details Patient Name: [name], [name] Date of Service: 02/13/2015 1:00 PM Medical Record Number: [id number] Patient Account Number: [id number] Date of Birth/Sex: 28-Aug-1956 (59 y.o. 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 HBO Treatment Details Chamber Type: Monoplace Patient Type: Outpatient Chamber #: [id number] Treatment Protocol: 2.0 ATA with 90 minutes oxygen, and no air breaks Treatment Details Compression Rate Down: 1.5 psi / minute De-Compression Rate Up: 1.5 psi / minute Air breaks and breathing Compress Tx Pressure Decompress Decompress periods Begins Reached Begins Ends (leave unused spaces blank) Chamber Pressure 1 ATA 2.0 ATA - - - - - - 2.0 ATA 1 ATA Clock Time (24 hr) 13:33 13:45 - - - - - - 15:15 15:27 Treatment Length: 114 (minutes) Treatment Segments: 4 Capillary Blood Glucose Pre Capillary Blood Glucose (mg/dl): Post Capillary Blood Glucose (mg/dl): Vital Signs Capillary Blood Glucose Reference Range: 80 - 120 mg / dl HBO Diabetic Blood Glucose Intervention Range: <131 mg/dl or >249 mg/dl Time Vitals Blood Respiratory Capillary Blood Glucose Pulse Action Type: Pulse: Temperature: Taken: Pressure: Rate: Glucose (mg/dl): Meter #: Oximetry (%) Taken: Pre 12:49 98/44 60 18 97.9 233 1 none Post 15:30 102/42 78 18 98.4 242 1 none Treatment Response Well TANEYAH, ABDELAZIZ ([id number]) Treatment Toleration: Treatment Treatment Completed without Adverse Event Completion Status: Electronic Signature(s) Signed:  02/13/2015 4:24:42 PM By: Paulla Fore, RRT, CHT Signed: 02/13/2015 5:02:53 PM By: Christin Fudge MD, FACS Entered By: Lorine Bears on 02/13/2015 16:22:19 [name] ([id number]) -------------------------------------------------------------------------------- HBO Safety Checklist Details Patient Name: [name] Date of Service: 02/13/2015 1:00 PM Medical Record Number: [id number] Patient Account Number: [id number] Date of Birth/Sex: April 04, 1956 (59 y.o. Female) Treating RN: Primary Care Physician: [name] Other Clinician: Jacqulyn Bath Referring Physician: [name] Treating Physician/Extender: Frann Rider in Treatment: 4 HBO Safety Checklist Items Safety Checklist Consent Form Signed Patient voided / foley secured and emptied When did you last eato 10:30 am Last dose of injectable or oral agent 09:00 am Ostomy pouch emptied and vented if applicable n/a All implantable devices assessed, documented and approved n/a Intravenous access site secured and place right arm, infusion site Valuables secured Linens and cotton and cotton/polyester blend (less than 51% polyester) Personal oil-based products / skin lotions / body lotions removed Wigs or hairpieces removed Smoking or tobacco materials removed Books / newspapers / magazines / loose paper removed Cologne, aftershave, perfume and deodorant removed Jewelry removed (may wrap wedding band) Make-up removed Hair care products removed Battery operated devices (external) removed Heating patches and chemical warmers removed Titanium eyewear removed Nail polish cured greater than 10 hours n/a Casting material cured greater than 10 hours n/a Hearing aids removed n/a Loose dentures or partials removed n/a Prosthetics have been removed n/a Patient demonstrates correct use of air break device (if applicable) Patient concerns have been addressed Patient grounding bracelet  on and cord attached to chamber Specifics for Inpatients (complete in addition to above) Medication sheet sent with patient Intravenous medications needed or due during therapy sent with patient SHUNTAY, SCHOENWALD ([id number]) Drainage tubes (e.g. nasogastric tube or chest tube secured and vented) Endotracheal or Tracheotomy tube secured Cuff deflated of air  and inflated with saline Airway suctioned Electronic Signature(s) Signed: 02/13/2015 4:24:42 PM By: Lorine Bears RCP, RRT, CHT Entered By: Lorine Bears on 02/13/2015 14:11:10

## 2015-02-13 NOTE — Telephone Encounter (Signed)
Not really sure this was forwarded to [facility]

## 2015-02-13 NOTE — Progress Notes (Signed)
[name], [name] ([id number]) Visit Report for 02/13/2015 Arrival Information Details Patient Name: [name], [name] Date of Service: 02/13/2015 1:00 PM Medical Record Number: [id number] Patient Account Number: [id number] Date of Birth/Sex: [birth year] (59 y.o. Female) Treating RN: Primary Care Physician: [name] Other Clinician: Jacqulyn Bath Referring Physician: [name] Treating Physician/Extender: Frann Rider in Treatment: 4 Visit Information History Since Last Visit Added or deleted any medications: No Patient Arrived: Wheel Chair Any new allergies or adverse reactions: No Arrival Time: 12:45 Had a fall or experienced change in No Accompanied By: husband activities of daily living that may affect Transfer Assistance: Manual risk of falls: Patient Identification Verified: Yes Signs or symptoms of abuse/neglect since last No Secondary Verification Process Yes visito Completed: Hospitalized since last visit: No Patient Requires Transmission- No Has Dressing in Place as Prescribed: Yes Based Precautions: Pain Present Now: No Patient Has Alerts: Yes Patient Alerts: Patient on Blood Thinner ABI L:0.8, R:0.93 Electronic Signature(s) Signed: 02/13/2015 4:24:42 PM By: Lorine Bears RCP, RRT, CHT Entered By: Lorine Bears on 02/13/2015 14:07:44 [name] ([id number]) -------------------------------------------------------------------------------- Encounter Discharge Information Details Patient Name: [name] Date of Service: 02/13/2015 1:00 PM Medical Record Number: [id number] Patient Account Number: [id number] Date of Birth/Sex: 04/21/1956 (59 y.o. Female) Treating RN: Primary Care Physician: [name] Other Clinician: Jacqulyn Bath Referring Physician: [name] Treating Physician/Extender: Frann Rider in Treatment: 4 Encounter Discharge Information Items Discharge Pain Level:  0 Discharge Condition: Stable Ambulatory Status: Wheelchair Discharge Destination: Home Transportation: Private Auto Accompanied By: husband Schedule Follow-up Appointment: No Medication Reconciliation completed No and provided to Patient/Care Essense Bousquet: Patient Clinical Summary of Care: Declined Notes Patient has an HBO treatment scheduled on 02/14/15 at 13:00 pm. Electronic Signature(s) Signed: 02/13/2015 4:24:42 PM By: Lorine Bears RCP, RRT, CHT Previous Signature: 02/13/2015 4:07:53 PM Version By: Ruthine Dose Entered By: Lorine Bears on 02/13/2015 16:23:43 [name] ([id number]) -------------------------------------------------------------------------------- Vitals Details Patient Name: [name] Date of Service: 02/13/2015 1:00 PM Medical Record Number: [id number] Patient Account Number: [id number] Date of Birth/Sex: [birth year] (59 y.o. Female) Treating RN: Primary Care Physician: [name] Other Clinician: Jacqulyn Bath Referring Physician: [name] Treating Physician/Extender: Frann Rider in Treatment: 4 Vital Signs Time Taken: 12:49 Temperature (F): 97.9 Height (in): 68 Pulse (bpm): 60 Weight (lbs): 257 Respiratory Rate (breaths/min): 18 Body Mass Index (BMI): 39.1 Blood Pressure (mmHg): 98/44 Capillary Blood Glucose (mg/dl): 233 Reference Range: 80 - 120 mg / dl Electronic Signature(s) Signed: 02/13/2015 4:24:42 PM By: Lorine Bears RCP, RRT, CHT Entered By: Lorine Bears on 02/13/2015 14:09:06

## 2015-02-13 NOTE — Telephone Encounter (Signed)
See previous documentation, was this taken care of? Can this encounter be closed? / Sherri

## 2015-02-14 ENCOUNTER — Encounter: Payer: PPO | Admitting: Surgery

## 2015-02-14 ENCOUNTER — Ambulatory Visit: Payer: PPO | Admitting: Surgery

## 2015-02-14 DIAGNOSIS — L97422 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 layer exposed: Secondary | ICD-10-CM | POA: Diagnosis not present

## 2015-02-14 LAB — GLUCOSE, CAPILLARY
Glucose-Capillary: 193 mg/dL — ABNORMAL HIGH (ref 70–99)
Glucose-Capillary: 242 mg/dL — ABNORMAL HIGH (ref 70–99)

## 2015-02-14 NOTE — Telephone Encounter (Signed)
The rheum appt is June 6 at 10:00.

## 2015-02-14 NOTE — Progress Notes (Signed)
ARLEANE, SPINALE ([id number]) Visit Report for 02/14/2015 Arrival Information Details Patient Name: [name], [name] Date of Service: 02/14/2015 1:00 PM Medical Record Number: [id number] Patient Account Number: [id number] Date of Birth/Sex: 04-24-1956 (59 y.o. Female) Treating RN: Primary Care Physician: [name] Other Clinician: Jacqulyn Bath Referring Physician: [name] Treating Physician/Extender: Frann Rider in Treatment: 5 Visit Information History Since Last Visit Added or deleted any medications: No Patient Arrived: Wheel Chair Any new allergies or adverse reactions: No Arrival Time: 12:50 Had a fall or experienced change in No Accompanied By: husband activities of daily living that may affect Transfer Assistance: Manual risk of falls: Patient Identification Verified: Yes Signs or symptoms of abuse/neglect since last No Secondary Verification Process Yes visito Completed: Hospitalized since last visit: No Patient Requires Transmission- No Has Dressing in Place as Prescribed: Yes Based Precautions: Pain Present Now: No Patient Has Alerts: Yes Patient Alerts: Patient on Blood Thinner ABI L:0.8, R:0.93 Electronic Signature(s) Signed: 02/14/2015 3:43:03 PM By: Lorine Bears RCP, RRT, CHT Entered By: Lorine Bears on 02/14/2015 13:02:30 [name] ([id number]) -------------------------------------------------------------------------------- Encounter Discharge Information Details Patient Name: [name] Date of Service: 02/14/2015 1:00 PM Medical Record Number: [id number] Patient Account Number: [id number] Date of Birth/Sex: 02-Nov-1955 (59 y.o. Female) Treating RN: Primary Care Physician: [name] Other Clinician: Jacqulyn Bath Referring Physician: [name] Treating Physician/Extender: Frann Rider in Treatment: 5 Encounter Discharge Information Items Discharge Pain Level:  0 Discharge Condition: Stable Ambulatory Status: Wheelchair Discharge Destination: Home Private Transportation: Auto Accompanied By: husband Schedule Follow-up Appointment: No Medication Reconciliation completed and No provided to Patient/Care Kree Armato: Clinical Summary of Care: Notes Patient has an HBO treatment scheduled on 02/15/15 at 13:00 pm. Electronic Signature(s) Signed: 02/14/2015 3:43:03 PM By: Lorine Bears RCP, RRT, CHT Entered By: Lorine Bears on 02/14/2015 15:29:55 [name] ([id number]) -------------------------------------------------------------------------------- Vitals Details Patient Name: [name] Date of Service: 02/14/2015 1:00 PM Medical Record Number: [id number] Patient Account Number: [id number] Date of Birth/Sex: September 21, 1956 (59 y.o. Female) Treating RN: Primary Care Physician: [name] Other Clinician: Jacqulyn Bath Referring Physician: [name] Treating Physician/Extender: Frann Rider in Treatment: 5 Vital Signs Time Taken: 12:50 Temperature (F): 96.8 Height (in): 68 Pulse (bpm): 72 Weight (lbs): 257 Respiratory Rate (breaths/min): 18 Body Mass Index (BMI): 39.1 Blood Pressure (mmHg): 100/46 Capillary Blood Glucose (mg/dl): 193 Reference Range: 80 - 120 mg / dl Electronic Signature(s) Signed: 02/14/2015 3:43:03 PM By: Lorine Bears RCP, RRT, CHT Entered By: Lorine Bears on 02/14/2015 13:02:58

## 2015-02-14 NOTE — Progress Notes (Addendum)
MIKYLA, SALATINO ([id number]) Visit Report for 02/14/2015 HBO Details Patient Name: [name], [name] Date of Service: 02/14/2015 1:00 PM Medical Record Number: [id number] Patient Account Number: [id number] Date of Birth/Sex: 01-Jan-1956 (59 y.o. 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 HBO Treatment Details Treatment Number: 2 Patient Type: Outpatient Chamber Type: Monoplace Chamber #: [id number] Treatment Protocol: 2.0 ATA with 90 minutes oxygen, and no air breaks Treatment Details Compression Rate Down: 1.5 psi / minute De-Compression Rate Up: 1.5 psi / minute Air breaks and breathing Compress Tx Pressure Decompress Decompress periods Begins Reached Begins Ends (leave unused spaces blank) Chamber Pressure 1 ATA 2.0 ATA - - - - - - 2.0 ATA 1 ATA Clock Time (24 hr) 13:19 13:31 - - - - - - 15:01 15:13 Treatment Length: 114 (minutes) Treatment Segments: 4 Capillary Blood Glucose Pre Capillary Blood Glucose (mg/dl): Post Capillary Blood Glucose (mg/dl): Vital Signs Capillary Blood Glucose Reference Range: 80 - 120 mg / dl HBO Diabetic Blood Glucose Intervention Range: <131 mg/dl or >249 mg/dl Time Vitals Blood Respiratory Capillary Blood Glucose Pulse Action Type: Pulse: Temperature: Taken: Pressure: Rate: Glucose (mg/dl): Meter #: Oximetry (%) Taken: Pre 12:50 100/46 72 18 96.8 193 1 see notes below Post 15:15 102/42 78 18 98.2 242 1 none Treatment Response Well ARIADNA, BECTON ([id number]) Treatment Toleration: Treatment Treatment Completed without Adverse Event Completion  Status: Treatment Notes I asked the patient when she had eaten last she stated last night (02/13/15) at 19:30 pm. I gave her an 8 oz. bottle of Ensure because of this. HBO Attestation I certify that I supervised this HBO treatment in accordance with Medicare guidelines. A trained Yes emergency response team is readily available per hospital policies and procedures. Continue HBOT as ordered. Yes Electronic Signature(s) Signed: 02/15/2015 4:28:24 PM By: Paulla Fore, RRT, CHT Signed: 02/15/2015 5:24:37 PM By: Christin Fudge MD, FACS Previous Signature: 02/14/2015 4:43:51 PM Version By: Christin Fudge MD, FACS Previous Signature: 02/14/2015 3:43:03 PM Version By: Lorine Bears RCP, RRT, CHT Previous Signature: 02/14/2015 2:25:04 PM Version By: Christin Fudge MD, FACS Entered By: Lorine Bears on 02/15/2015 13:25:33 [name] ([id number]) -------------------------------------------------------------------------------- HBO Safety Checklist Details Patient Name: [name] Date of Service: 02/14/2015 1:00 PM Medical Record Number: [id number] Patient Account Number: [id number] Date of Birth/Sex: 12/13/1955 (59 y.o. Female) Treating RN: Primary Care Physician: [name] Other Clinician: Jacqulyn Bath Referring Physician: [name] Treating Physician/Extender: Frann Rider in Treatment: 5 HBO Safety Checklist Items Safety Checklist Consent Form Signed Patient voided / foley secured and emptied When did you last eato 19:30 pm on 02/13/15 Last dose of injectable or oral agent 09:00 am Ostomy pouch emptied and vented if applicable n/a All implantable devices assessed, documented and approved n/a Intravenous access site secured and place PICC line in right arm Valuables secured Linens and cotton and cotton/polyester blend (less than 51% polyester) Personal oil-based products / skin lotions / body lotions removed Wigs  or hairpieces removed Smoking or tobacco materials removed Books / newspapers / magazines / loose paper removed Cologne, aftershave, perfume and deodorant removed Jewelry removed (may wrap wedding band) Make-up removed Hair care products removed Battery operated devices (external) removed Heating patches and  chemical warmers removed Titanium eyewear removed Nail polish cured greater than 10 hours n/a Casting material cured greater than 10 hours n/a Hearing aids removed n/a Loose dentures or partials removed n/a Prosthetics have been removed n/a Patient demonstrates correct use of air break device (if applicable) Patient concerns have been addressed Patient grounding bracelet on and cord attached to chamber Specifics for Inpatients (complete in addition to above) Medication sheet sent with patient Intravenous medications needed or due during therapy sent with patient TYRANNY, HELMKE ([id number]) Drainage tubes (e.g. nasogastric tube or chest tube secured and vented) Endotracheal or Tracheotomy tube secured Cuff deflated of air and inflated with saline Airway suctioned Electronic Signature(s) Signed: 02/14/2015 3:43:03 PM By: Lorine Bears RCP, RRT, CHT Entered By: Lorine Bears on 02/14/2015 13:24:36

## 2015-02-15 ENCOUNTER — Encounter: Payer: PPO | Admitting: Surgery

## 2015-02-15 DIAGNOSIS — L97422 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 layer exposed: Secondary | ICD-10-CM | POA: Diagnosis not present

## 2015-02-15 LAB — GLUCOSE, CAPILLARY
GLUCOSE-CAPILLARY: 194 mg/dL — AB (ref 70–99)
Glucose-Capillary: 152 mg/dL — ABNORMAL HIGH (ref 70–99)

## 2015-02-15 NOTE — Progress Notes (Signed)
DALTON, EALY ([id number]) Visit Report for 02/15/2015 Arrival Information Details Patient Name: [name], [name] Date of Service: 02/15/2015 1:00 PM Medical Record Number: [id number] Patient Account Number: [id number] Date of Birth/Sex: 01-27-1956 (59 y.o. Female) Treating RN: Primary Care Physician: [name] Other Clinician: Referring Physician: [name] Treating Physician/Extender: Suella Grove in Treatment: 5 Visit Information History Since Last Visit Added or deleted any medications: No Patient Arrived: Wheel Chair Any new allergies or adverse reactions: No Arrival Time: 12:45 Had a fall or experienced change in No Accompanied By: husband activities of daily living that may affect Transfer Assistance: Harrel Lemon Lift risk of falls: Patient Identification Verified: Yes Signs or symptoms of abuse/neglect since last No Secondary Verification Process Yes visito Completed: Hospitalized since last visit: No Patient Requires Transmission- No Has Dressing in Place as Prescribed: Yes Based Precautions: Pain Present Now: No Patient Has Alerts: Yes Patient Alerts: Patient on Blood Thinner ABI L:0.8, R:0.93 Electronic Signature(s) Signed: 02/15/2015 4:28:24 PM By: Lorine Bears RCP, RRT, CHT Entered By: Lorine Bears on 02/15/2015 12:55:07 [name] ([id number]) -------------------------------------------------------------------------------- Encounter Discharge Information Details Patient Name: [name] Date of Service: 02/15/2015 1:00 PM Medical Record Number: [id number] Patient Account Number: [id number] Date of Birth/Sex: 1956-05-06 (59 y.o. Female) Treating RN: Primary Care Physician: [name] Other Clinician: Referring Physician: [name] Treating Physician/Extender: Suella Grove in Treatment: 5 Encounter Discharge Information Items Discharge Pain Level: 0 Discharge Condition: Stable Ambulatory Status:  Wheelchair Discharge Destination: Home Private Transportation: Auto Accompanied By: husband Schedule Follow-up Appointment: No Medication Reconciliation completed and No provided to Patient/Care Reghan Thul: Clinical Summary of Care: Notes Patient has an HBO treatment scheduled on 02/16/15 at 13:00 pm. Electronic Signature(s) Signed: 02/15/2015 4:28:24 PM By: Lorine Bears RCP, RRT, CHT Entered By: Lorine Bears on 02/15/2015 16:27:50 [name] ([id number]) -------------------------------------------------------------------------------- Vitals Details Patient Name: [name] Date of Service: 02/15/2015 1:00 PM Medical Record Number: [id number] Patient Account Number: [id number] Date of Birth/Sex: 02/19/1956 (59 y.o. Female) Treating RN: Primary Care Physician: [name] Other Clinician: Referring Physician: [name] Treating Physician/Extender: Suella Grove in Treatment: 5 Vital Signs Time Taken: 12:47 Temperature (F): 96.5 Height (in): 68 Pulse (bpm): 72 Weight (lbs): 257 Respiratory Rate (breaths/min): 18 Body Mass Index (BMI): 39.1 Blood Pressure (mmHg): 100/48 Capillary Blood Glucose (mg/dl): 152 Reference Range: 80 - 120 mg / dl Electronic Signature(s) Signed: 02/15/2015 4:28:24 PM By: Lorine Bears RCP, RRT, CHT Entered By: Lorine Bears on 02/15/2015 12:55:31

## 2015-02-15 NOTE — Progress Notes (Signed)
LANEICE, [place]RE[place]Y ([id number]) Visit Report for 02/15/2015 HBO Details Patient Name: [name], [name] Date of Service: 02/15/2015 1:00 PM Medical Record Number: [id number] Patient Account Number: [id number] Date of Birth/Sex: 1955/11/14 (59 y.o. 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 HBO Treatment Details Treatment Number: 3 Patient Type: Outpatient Chamber Type: Monoplace Chamber #: [id number] Treatment Protocol: 2.0 ATA with 90 minutes oxygen, and no air breaks Treatment Details Compression Rate Down: 1.5 psi / minute De-Compression Rate Up: 1.5 psi / minute Air breaks and breathing Compress Tx Pressure Decompress Decompress periods Begins Reached Begins Ends (leave unused spaces blank) Chamber Pressure 1 ATA 2.0 ATA - - - - - - 2.0 ATA 1 ATA Clock Time (24 hr) 13:15 13:27 - - - - - - 14:58 15:08 Treatment Length: 113 (minutes) Treatment Segments: 4 Capillary Blood Glucose Pre Capillary Blood Glucose (mg/dl): Post Capillary Blood Glucose (mg/dl): Vital Signs Capillary Blood Glucose Reference Range: 80 - 120 mg / dl HBO Diabetic Blood Glucose Intervention Range: <131 mg/dl or >249 mg/dl Time Vitals Blood Respiratory Capillary Blood Glucose Pulse Action Type: Pulse: Temperature: Taken: Pressure: Rate: Glucose (mg/dl): Meter #: Oximetry (%) Taken: Pre 12:47 100/48 72 18 96.5 152 1 none Post 15:14 102/78 72 18 96 Treatment Response Well [name], [name] ([id number]) Treatment Toleration: Treatment Treatment Completed without Adverse Event Completion Status: HBO Attestation I certify that I  supervised this HBO treatment in accordance with Medicare guidelines. A trained Yes emergency response team is readily available per hospital policies and procedures. Continue HBOT as ordered. Yes Electronic Signature(s) Signed: 02/15/2015 4:28:24 PM By: Lorine Bears RCP, RRT, CHT Previous Signature: 02/15/2015 4:12:25 PM Version By: Loletha Grayer MD Entered By: Lorine Bears on 02/15/2015 16:26:55 [name] ([id number]) -------------------------------------------------------------------------------- HBO Safety Checklist Details Patient Name: [name] Date of Service: 02/15/2015 1:00 PM Medical Record Number: [id number] Patient Account Number: [id number] Date of Birth/Sex: [birth year] (59 y.o. Female) Treating RN: Primary Care Physician: [name] Other Clinician: Referring Physician: [name] Treating Physician/Extender: Suella Grove in Treatment: 5 HBO Safety Checklist Items Safety Checklist Consent Form Signed Patient voided / foley secured and emptied When did you last eato 11:00 am Last dose of injectable or oral agent 10:30 am Ostomy pouch emptied and vented if applicable n/a All implantable devices assessed, documented and approved n/a Intravenous access site secured and place PICC line in left arm Valuables secured Linens and cotton and cotton/polyester blend (less than 51% polyester) Personal oil-based products / skin lotions / body lotions removed Wigs or hairpieces removed Smoking or tobacco materials removed Books / newspapers / magazines / loose paper removed Cologne, aftershave, perfume and deodorant removed Jewelry removed (may wrap wedding band) Make-up removed Hair care products removed Battery operated devices (external) removed Heating patches and chemical warmers removed Titanium eyewear removed Nail polish cured greater than 10 hours n/a Casting material cured greater than 10 hours n/a Hearing aids  removed n/a Loose dentures or partials removed n/a Prosthetics have been removed n/a Patient demonstrates correct use of air break device (if applicable) Patient concerns have been addressed Patient grounding bracelet on and cord attached to chamber Specifics for Inpatients (complete in addition to above) Medication sheet sent with patient Intravenous medications needed  or due during therapy sent with patient [name], [name] ([id number]) Drainage tubes (e.g. nasogastric tube or chest tube secured and vented) Endotracheal or Tracheotomy tube secured Cuff deflated of air and inflated with saline Airway suctioned Electronic Signature(s) Signed: 02/15/2015 4:28:24 PM By: Lorine Bears RCP, RRT, CHT Entered By: Becky Sax, Amado Nash on 02/15/2015 13:22:58

## 2015-02-16 ENCOUNTER — Encounter: Payer: PPO | Admitting: Surgery

## 2015-02-16 DIAGNOSIS — L97422 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 layer exposed: Secondary | ICD-10-CM | POA: Diagnosis not present

## 2015-02-16 LAB — GLUCOSE, CAPILLARY
GLUCOSE-CAPILLARY: 205 mg/dL — AB (ref 65–99)
Glucose-Capillary: 275 mg/dL — ABNORMAL HIGH (ref 65–99)

## 2015-02-16 NOTE — Progress Notes (Signed)
Vickie, [name] ([id number]) Visit Report for 02/16/2015 Arrival Information Details Patient Name: [name], [name] Date of Service: 02/16/2015 1:00 PM Medical Record Number: [id number] Patient Account Number: [id number] Date of Birth/Sex: 09-21-1956 (59 y.o. Female) Treating RN: Primary Care Physician: [name] Other Clinician: Jacqulyn Bath Referring Physician: [name] Treating Physician/Extender: Frann Rider in Treatment: 5 Visit Information History Since Last Visit Added or deleted any medications: No Patient Arrived: Wheel Chair Any new allergies or adverse reactions: No Arrival Time: 12:30 Had a fall or experienced change in No Accompanied By: husband activities of daily living that may affect Transfer Assistance: Harrel Lemon Lift risk of falls: Patient Identification Verified: Yes Signs or symptoms of abuse/neglect since last No Secondary Verification Process Yes visito Completed: Hospitalized since last visit: No Patient Requires Transmission- No Has Dressing in Place as Prescribed: Yes Based Precautions: Pain Present Now: No Patient Has Alerts: Yes Patient Alerts: Patient on Blood Thinner ABI L:0.8, R:0.93 Electronic Signature(s) Signed: 02/16/2015 3:47:24 PM By: Lorine Bears RCP, RRT, CHT Entered By: Lorine Bears on 02/16/2015 13:20:22 [name] ([id number]) -------------------------------------------------------------------------------- Encounter Discharge Information Details Patient Name: [name] Date of Service: 02/16/2015 1:00 PM Medical Record Number: [id number] Patient Account Number: [id number] Date of Birth/Sex: Aug 30, 1956 (59 y.o. Female) Treating RN: Primary Care Physician: [name] Other Clinician: Jacqulyn Bath Referring Physician: [name] Treating Physician/Extender: Frann Rider in Treatment: 5 Encounter Discharge Information Items Discharge Pain Level:  0 Discharge Condition: Stable Ambulatory Status: Wheelchair Discharge Destination: Home Private Transportation: Auto Accompanied By: husband Schedule Follow-up Appointment: No Medication Reconciliation completed and No provided to Patient/Care Anyeli Hockenbury: Clinical Summary of Care: Notes Patient has an HBO treatment scheduled on 02/17/15 at 13:00 pm. Electronic Signature(s) Signed: 02/16/2015 3:47:24 PM By: Lorine Bears RCP, RRT, CHT Entered By: Lorine Bears on 02/16/2015 15:46:19 [name] ([id number]) -------------------------------------------------------------------------------- Vitals Details Patient Name: [name] Date of Service: 02/16/2015 1:00 PM Medical Record Number: [id number] Patient Account Number: [id number] Date of Birth/Sex: Jul 11, 1956 (59 y.o. Female) Treating RN: Primary Care Physician: [name] Other Clinician: Jacqulyn Bath Referring Physician: [name] Treating Physician/Extender: Frann Rider in Treatment: 5 Vital Signs Time Taken: 12:35 Temperature (F): 98.5 Height (in): 68 Pulse (bpm): 72 Weight (lbs): 257 Respiratory Rate (breaths/min): 18 Body Mass Index (BMI): 39.1 Blood Pressure (mmHg): 104/52 Capillary Blood Glucose (mg/dl): 205 Reference Range: 80 - 120 mg / dl Electronic Signature(s) Signed: 02/16/2015 3:47:24 PM By: Lorine Bears RCP, RRT, CHT Entered By: Becky Sax, Amado Nash on 02/16/2015 13:21:28

## 2015-02-16 NOTE — Progress Notes (Signed)
[name], [name] ([id number]) Visit Report for 02/16/2015 HBO Details Patient Name: [name], [name] Date of Service: 02/16/2015 1:00 PM Medical Record Number: [id number] Patient Account Number: [id number] Date of Birth/Sex: 02/12/1956 (59 y.o. 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 HBO Treatment Details Treatment Number: 4 Patient Type: Outpatient Chamber Type: Monoplace Chamber #: [id number] Treatment Protocol: 2.0 ATA with 90 minutes oxygen, and no air breaks Treatment Details Compression Rate Down: 1.5 psi / minute De-Compression Rate Up: 1.5 psi / minute Air breaks and breathing Compress Tx Pressure Decompress Decompress periods Begins Reached Begins Ends (leave unused spaces blank) Chamber Pressure 1 ATA 2.0 ATA - - - - - - 2.0 ATA 1 ATA Clock Time (24 hr) 13:04 13:15 - - - - - - 14:45 14:57 Treatment Length: 113 (minutes) Treatment Segments: 4 Capillary Blood Glucose Pre Capillary Blood Glucose (mg/dl): Post Capillary Blood Glucose (mg/dl): Vital Signs Capillary Blood Glucose Reference Range: 80 - 120 mg / dl HBO Diabetic Blood Glucose Intervention Range: <131 mg/dl or >249 mg/dl Time Vitals Blood Respiratory Capillary Blood Glucose Pulse Action Type: Pulse: Temperature: Taken: Pressure: Rate: Glucose (mg/dl): Meter #: Oximetry (%) Taken: Pre 12:35 104/52 72 18 98.5 205 1 none Post 15:05 96/44 72 18 98 Treatment Response Well [name], [name] ([id number]) Treatment Toleration: Treatment Treatment Completed without Adverse Event Completion Status: HBO Attestation I certify  that I supervised this HBO treatment in accordance with Medicare guidelines. A trained Yes emergency response team is readily available per hospital policies and procedures. Continue HBOT as ordered. Yes Electronic Signature(s) Signed: 02/16/2015 3:47:24 PM By: Paulla Fore, RRT, CHT Signed: 02/16/2015 4:49:36 PM By: Christin Fudge MD, FACS Entered By: Lorine Bears on 02/16/2015 15:45:34 [name] ([id number]) -------------------------------------------------------------------------------- HBO Safety Checklist Details Patient Name: [name] Date of Service: 02/16/2015 1:00 PM Medical Record Number: [id number] Patient Account Number: [id number] Date of Birth/Sex: Apr 17, 1956 (59 y.o. Female) Treating RN: Primary Care Physician: [name] Other Clinician: Jacqulyn Bath Referring Physician: [name] Treating Physician/Extender: Frann Rider in Treatment: 5 HBO Safety Checklist Items Safety Checklist Consent Form Signed Patient voided / foley secured and emptied When did you last eato 11:00 am Last dose of injectable or oral agent 0900 am Ostomy pouch emptied and vented if applicable n/a All implantable devices assessed, documented and approved n/a Intravenous access site secured and place PICC line in right arm Valuables secured Linens and cotton and cotton/polyester blend (less than 51% polyester) Personal oil-based products / skin lotions / body lotions removed Wigs or hairpieces removed Smoking or tobacco materials removed Books / newspapers / magazines / loose paper removed Cologne, aftershave, perfume and deodorant removed Jewelry removed (may wrap wedding band) Make-up removed Hair care products removed Battery operated devices (external) removed Heating patches and chemical warmers removed Titanium eyewear removed Nail polish cured greater than 10 hours n/a Casting material cured greater than 10 hours  n/a Hearing aids removed n/a Loose dentures or partials removed n/a Prosthetics have been removed n/a Patient demonstrates correct use of air break device (if applicable) Patient concerns have been addressed Patient grounding bracelet on and cord attached to chamber Specifics for Inpatients (complete in addition to above) Medication sheet sent with  patient Intravenous medications needed or due during therapy sent with patient [name], [name] ([id number]) Drainage tubes (e.g. nasogastric tube or chest tube secured and vented) Endotracheal or Tracheotomy tube secured Cuff deflated of air and inflated with saline Airway suctioned Electronic Signature(s) Signed: 02/16/2015 3:47:24 PM By: Lorine Bears RCP, RRT, CHT Entered By: Lorine Bears on 02/16/2015 13:23:10

## 2015-02-17 ENCOUNTER — Encounter: Payer: PPO | Admitting: Surgery

## 2015-02-17 ENCOUNTER — Inpatient Hospital Stay
Admission: EM | Admit: 2015-02-17 | Discharge: 2015-02-24 | DRG: 377 | Disposition: A | Discharge status: Home Health | Payer: PPO | Attending: Internal Medicine | Admitting: Internal Medicine

## 2015-02-17 ENCOUNTER — Encounter: Payer: Self-pay | Admitting: Emergency Medicine

## 2015-02-17 ENCOUNTER — Other Ambulatory Visit: Payer: Self-pay

## 2015-02-17 DIAGNOSIS — I447 Left bundle-branch block, unspecified: Secondary | ICD-10-CM | POA: Diagnosis present

## 2015-02-17 DIAGNOSIS — Z66 Do not resuscitate: Secondary | ICD-10-CM | POA: Diagnosis present

## 2015-02-17 DIAGNOSIS — Z7902 Long term (current) use of antithrombotics/antiplatelets: Secondary | ICD-10-CM | POA: Diagnosis not present

## 2015-02-17 DIAGNOSIS — I4891 Unspecified atrial fibrillation: Secondary | ICD-10-CM | POA: Diagnosis not present

## 2015-02-17 DIAGNOSIS — L97529 Non-pressure chronic ulcer of other part of left foot with unspecified severity: Secondary | ICD-10-CM | POA: Diagnosis present

## 2015-02-17 DIAGNOSIS — I959 Hypotension, unspecified: Secondary | ICD-10-CM | POA: Diagnosis not present

## 2015-02-17 DIAGNOSIS — R509 Fever, unspecified: Secondary | ICD-10-CM | POA: Diagnosis not present

## 2015-02-17 DIAGNOSIS — D500 Iron deficiency anemia secondary to blood loss (chronic): Secondary | ICD-10-CM | POA: Diagnosis present

## 2015-02-17 DIAGNOSIS — T45525A Adverse effect of antithrombotic drugs, initial encounter: Secondary | ICD-10-CM | POA: Diagnosis present

## 2015-02-17 DIAGNOSIS — E1142 Type 2 diabetes mellitus with diabetic polyneuropathy: Secondary | ICD-10-CM | POA: Diagnosis present

## 2015-02-17 DIAGNOSIS — I255 Ischemic cardiomyopathy: Secondary | ICD-10-CM | POA: Diagnosis present

## 2015-02-17 DIAGNOSIS — D122 Benign neoplasm of ascending colon: Secondary | ICD-10-CM | POA: Diagnosis present

## 2015-02-17 DIAGNOSIS — I471 Supraventricular tachycardia: Secondary | ICD-10-CM | POA: Diagnosis not present

## 2015-02-17 DIAGNOSIS — I2510 Atherosclerotic heart disease of native coronary artery without angina pectoris: Secondary | ICD-10-CM | POA: Diagnosis present

## 2015-02-17 DIAGNOSIS — I252 Old myocardial infarction: Secondary | ICD-10-CM

## 2015-02-17 DIAGNOSIS — J9811 Atelectasis: Secondary | ICD-10-CM | POA: Diagnosis present

## 2015-02-17 DIAGNOSIS — I472 Ventricular tachycardia: Secondary | ICD-10-CM | POA: Diagnosis not present

## 2015-02-17 DIAGNOSIS — T501X5A Adverse effect of loop [high-ceiling] diuretics, initial encounter: Secondary | ICD-10-CM | POA: Diagnosis present

## 2015-02-17 DIAGNOSIS — R609 Edema, unspecified: Secondary | ICD-10-CM

## 2015-02-17 DIAGNOSIS — Z79899 Other long term (current) drug therapy: Secondary | ICD-10-CM | POA: Diagnosis not present

## 2015-02-17 DIAGNOSIS — I82622 Acute embolism and thrombosis of deep veins of left upper extremity: Secondary | ICD-10-CM | POA: Diagnosis not present

## 2015-02-17 DIAGNOSIS — I319 Disease of pericardium, unspecified: Secondary | ICD-10-CM | POA: Diagnosis present

## 2015-02-17 DIAGNOSIS — I5022 Chronic systolic (congestive) heart failure: Secondary | ICD-10-CM | POA: Diagnosis present

## 2015-02-17 DIAGNOSIS — I341 Nonrheumatic mitral (valve) prolapse: Secondary | ICD-10-CM | POA: Diagnosis not present

## 2015-02-17 DIAGNOSIS — E876 Hypokalemia: Secondary | ICD-10-CM | POA: Diagnosis not present

## 2015-02-17 DIAGNOSIS — K921 Melena: Secondary | ICD-10-CM | POA: Diagnosis present

## 2015-02-17 DIAGNOSIS — J9601 Acute respiratory failure with hypoxia: Secondary | ICD-10-CM | POA: Diagnosis not present

## 2015-02-17 DIAGNOSIS — I890 Lymphedema, not elsewhere classified: Secondary | ICD-10-CM | POA: Diagnosis present

## 2015-02-17 DIAGNOSIS — D120 Benign neoplasm of cecum: Secondary | ICD-10-CM | POA: Diagnosis present

## 2015-02-17 DIAGNOSIS — N170 Acute kidney failure with tubular necrosis: Secondary | ICD-10-CM | POA: Diagnosis not present

## 2015-02-17 DIAGNOSIS — T39015A Adverse effect of aspirin, initial encounter: Secondary | ICD-10-CM | POA: Diagnosis present

## 2015-02-17 DIAGNOSIS — I129 Hypertensive chronic kidney disease with stage 1 through stage 4 chronic kidney disease, or unspecified chronic kidney disease: Secondary | ICD-10-CM | POA: Diagnosis present

## 2015-02-17 DIAGNOSIS — G4733 Obstructive sleep apnea (adult) (pediatric): Secondary | ICD-10-CM | POA: Diagnosis present

## 2015-02-17 DIAGNOSIS — J9600 Acute respiratory failure, unspecified whether with hypoxia or hypercapnia: Secondary | ICD-10-CM | POA: Diagnosis not present

## 2015-02-17 DIAGNOSIS — Z955 Presence of coronary angioplasty implant and graft: Secondary | ICD-10-CM | POA: Diagnosis not present

## 2015-02-17 DIAGNOSIS — D649 Anemia, unspecified: Secondary | ICD-10-CM | POA: Diagnosis not present

## 2015-02-17 DIAGNOSIS — Z7982 Long term (current) use of aspirin: Secondary | ICD-10-CM | POA: Diagnosis not present

## 2015-02-17 DIAGNOSIS — Z6839 Body mass index (BMI) 39.0-39.9, adult: Secondary | ICD-10-CM | POA: Diagnosis not present

## 2015-02-17 DIAGNOSIS — M869 Osteomyelitis, unspecified: Secondary | ICD-10-CM | POA: Diagnosis present

## 2015-02-17 DIAGNOSIS — N183 Chronic kidney disease, stage 3 (moderate): Secondary | ICD-10-CM | POA: Diagnosis present

## 2015-02-17 DIAGNOSIS — D62 Acute posthemorrhagic anemia: Secondary | ICD-10-CM | POA: Diagnosis present

## 2015-02-17 DIAGNOSIS — R0602 Shortness of breath: Secondary | ICD-10-CM

## 2015-02-17 DIAGNOSIS — E871 Hypo-osmolality and hyponatremia: Secondary | ICD-10-CM | POA: Diagnosis present

## 2015-02-17 DIAGNOSIS — Z95810 Presence of automatic (implantable) cardiac defibrillator: Secondary | ICD-10-CM

## 2015-02-17 DIAGNOSIS — N189 Chronic kidney disease, unspecified: Secondary | ICD-10-CM | POA: Diagnosis not present

## 2015-02-17 DIAGNOSIS — K922 Gastrointestinal hemorrhage, unspecified: Secondary | ICD-10-CM | POA: Diagnosis present

## 2015-02-17 HISTORY — DX: Chronic systolic (congestive) heart failure: I50.22

## 2015-02-17 HISTORY — DX: Left bundle-branch block, unspecified: I44.7

## 2015-02-17 HISTORY — DX: Atherosclerotic heart disease of native coronary artery without angina pectoris: I25.10

## 2015-02-17 LAB — IRON AND TIBC
Iron: 12 ug/dL — ABNORMAL LOW (ref 28–170)
Saturation Ratios: 5 % — ABNORMAL LOW (ref 10.4–31.8)
TIBC: 236 ug/dL — ABNORMAL LOW (ref 250–450)
UIBC: 224 ug/dL

## 2015-02-17 LAB — CBC
HCT: 21.5 % — ABNORMAL LOW (ref 35.0–47.0)
HEMOGLOBIN: 6.7 g/dL — AB (ref 12.0–16.0)
MCH: 22.1 pg — AB (ref 26.0–34.0)
MCHC: 31.2 g/dL — ABNORMAL LOW (ref 32.0–36.0)
MCV: 70.6 fL — ABNORMAL LOW (ref 80.0–100.0)
Platelets: 259 10*3/uL (ref 150–440)
RBC: 3.05 MIL/uL — AB (ref 3.80–5.20)
RDW: 17.7 % — ABNORMAL HIGH (ref 11.5–14.5)
WBC: 4 10*3/uL (ref 3.6–11.0)

## 2015-02-17 LAB — GLUCOSE, CAPILLARY
GLUCOSE-CAPILLARY: 194 mg/dL — AB (ref 65–99)
Glucose-Capillary: 87 mg/dL (ref 65–99)

## 2015-02-17 LAB — BASIC METABOLIC PANEL
ANION GAP: 9 (ref 5–15)
BUN: 49 mg/dL — ABNORMAL HIGH (ref 6–20)
CO2: 26 mmol/L (ref 22–32)
Calcium: 8 mg/dL — ABNORMAL LOW (ref 8.9–10.3)
Chloride: 96 mmol/L — ABNORMAL LOW (ref 101–111)
Creatinine, Ser: 2.06 mg/dL — ABNORMAL HIGH (ref 0.44–1.00)
GFR calc Af Amer: 29 mL/min — ABNORMAL LOW (ref >60)
GFR calc non Af Amer: 25 mL/min — ABNORMAL LOW (ref >60)
GLUCOSE: 134 mg/dL — AB (ref 65–99)
Potassium: 3.8 mmol/L (ref 3.5–5.1)
Sodium: 131 mmol/L — ABNORMAL LOW (ref 135–145)

## 2015-02-17 LAB — HEMOGLOBIN AND HEMATOCRIT, BLOOD
HEMATOCRIT: 20.3 % — AB (ref 35.0–47.0)
Hemoglobin: 6.4 g/dL — ABNORMAL LOW (ref 12.0–16.0)

## 2015-02-17 LAB — FERRITIN: FERRITIN: 120 ng/mL (ref 11–307)

## 2015-02-17 LAB — PREPARE RBC (CROSSMATCH)

## 2015-02-17 LAB — ABO/RH: ABO/RH(D): O POS

## 2015-02-17 MED ORDER — ONDANSETRON HCL 4 MG/2ML IJ SOLN: 4.0000 mg | Freq: Four times a day (QID) | INTRAMUSCULAR | Status: DC | PRN

## 2015-02-17 MED ORDER — ACETAMINOPHEN 650 MG RE SUPP: 650.0000 mg | Freq: Four times a day (QID) | RECTAL | Status: DC | PRN

## 2015-02-17 MED ORDER — CARVEDILOL 25 MG PO TABS
25.0000 mg | ORAL_TABLET | Freq: Two times a day (BID) | ORAL | Status: DC
  Administered 2015-02-17 – 2015-02-24 (×13): 25 mg via ORAL
  Filled 2015-02-17 (×15): qty 1

## 2015-02-17 MED ORDER — INSULIN ASPART 100 UNIT/ML ~~LOC~~ SOLN
0.0000 [IU] | Freq: Three times a day (TID) | SUBCUTANEOUS | Status: DC
  Administered 2015-02-18: 2 [IU] via SUBCUTANEOUS
  Administered 2015-02-19: 1 [IU] via SUBCUTANEOUS
  Administered 2015-02-21 (×2): 2 [IU] via SUBCUTANEOUS
  Administered 2015-02-23 (×2): 7 [IU] via SUBCUTANEOUS
  Administered 2015-02-23: 2 [IU] via SUBCUTANEOUS
  Administered 2015-02-24: 1 [IU] via SUBCUTANEOUS
  Administered 2015-02-24: 5 [IU] via SUBCUTANEOUS
  Filled 2015-02-17: qty 2
  Filled 2015-02-17: qty 7
  Filled 2015-02-17: qty 1
  Filled 2015-02-17: qty 5
  Filled 2015-02-17: qty 2
  Filled 2015-02-17: qty 7
  Filled 2015-02-17 (×2): qty 2
  Filled 2015-02-17: qty 1

## 2015-02-17 MED ORDER — LEVOFLOXACIN 500 MG PO TABS: 500.0000 mg | ORAL_TABLET | Freq: Every day | ORAL | Status: DC

## 2015-02-17 MED ORDER — GLIMEPIRIDE 4 MG PO TABS
4.0000 mg | ORAL_TABLET | Freq: Two times a day (BID) | ORAL | Status: DC
  Administered 2015-02-17 – 2015-02-21 (×6): 4 mg via ORAL
  Filled 2015-02-17 (×10): qty 1

## 2015-02-17 MED ORDER — NORTRIPTYLINE HCL 25 MG PO CAPS
50.0000 mg | ORAL_CAPSULE | Freq: Every day | ORAL | Status: DC
  Administered 2015-02-17 – 2015-02-23 (×7): 50 mg via ORAL
  Filled 2015-02-17 (×8): qty 2

## 2015-02-17 MED ORDER — ALUM & MAG HYDROXIDE-SIMETH 200-200-20 MG/5ML PO SUSP: 30.0000 mL | Freq: Four times a day (QID) | ORAL | Status: DC | PRN

## 2015-02-17 MED ORDER — GABAPENTIN 600 MG PO TABS
600.0000 mg | ORAL_TABLET | Freq: Three times a day (TID) | ORAL | Status: DC
  Administered 2015-02-17: 600 mg via ORAL
  Filled 2015-02-17 (×4): qty 1

## 2015-02-17 MED ORDER — NITROGLYCERIN 0.4 MG SL SUBL: 0.4000 mg | SUBLINGUAL_TABLET | SUBLINGUAL | Status: DC | PRN

## 2015-02-17 MED ORDER — VANCOMYCIN HCL 10 G IV SOLR
1500.0000 mg | INTRAVENOUS | Status: DC
  Filled 2015-02-17 (×2): qty 1500

## 2015-02-17 MED ORDER — SENNOSIDES-DOCUSATE SODIUM 8.6-50 MG PO TABS: 1.0000 | ORAL_TABLET | Freq: Every evening | ORAL | Status: DC | PRN

## 2015-02-17 MED ORDER — SODIUM CHLORIDE 0.9 % IV SOLN: 1500.0000 mg | INTRAVENOUS | Status: DC

## 2015-02-17 MED ORDER — TORSEMIDE 20 MG PO TABS
40.0000 mg | ORAL_TABLET | Freq: Every day | ORAL | Status: DC
  Administered 2015-02-17 – 2015-02-24 (×6): 40 mg via ORAL
  Filled 2015-02-17 (×6): qty 2

## 2015-02-17 MED ORDER — ONDANSETRON HCL 4 MG PO TABS: 4.0000 mg | ORAL_TABLET | Freq: Four times a day (QID) | ORAL | Status: DC | PRN

## 2015-02-17 MED ORDER — DOXYCYCLINE HYCLATE 100 MG PO TABS
100.0000 mg | ORAL_TABLET | Freq: Two times a day (BID) | ORAL | Status: DC
  Administered 2015-02-18 – 2015-02-24 (×12): 100 mg via ORAL
  Filled 2015-02-17 (×17): qty 1

## 2015-02-17 MED ORDER — ACETAMINOPHEN 325 MG PO TABS: 650.0000 mg | ORAL_TABLET | Freq: Four times a day (QID) | ORAL | Status: DC | PRN

## 2015-02-17 MED ORDER — SODIUM CHLORIDE 0.9 % IV SOLN
Freq: Once | INTRAVENOUS | Status: AC
  Administered 2015-02-17: 17:00:00 via INTRAVENOUS

## 2015-02-17 MED ORDER — HYDRALAZINE HCL 50 MG PO TABS
50.0000 mg | ORAL_TABLET | Freq: Three times a day (TID) | ORAL | Status: DC
  Administered 2015-02-17 – 2015-02-21 (×10): 50 mg via ORAL
  Filled 2015-02-17 (×11): qty 1

## 2015-02-17 MED ORDER — PANTOPRAZOLE SODIUM 40 MG IV SOLR
40.0000 mg | Freq: Two times a day (BID) | INTRAVENOUS | Status: DC
  Administered 2015-02-17 – 2015-02-24 (×13): 40 mg via INTRAVENOUS
  Filled 2015-02-17 (×13): qty 40

## 2015-02-17 MED ORDER — PRAVASTATIN SODIUM 20 MG PO TABS
40.0000 mg | ORAL_TABLET | Freq: Every day | ORAL | Status: DC
  Administered 2015-02-17 – 2015-02-23 (×7): 40 mg via ORAL
  Filled 2015-02-17 (×2): qty 2
  Filled 2015-02-17: qty 1
  Filled 2015-02-17: qty 2
  Filled 2015-02-17: qty 1
  Filled 2015-02-17: qty 2
  Filled 2015-02-17 (×2): qty 1
  Filled 2015-02-17: qty 2
  Filled 2015-02-17: qty 1

## 2015-02-17 MED ORDER — FERROUS SULFATE 325 (65 FE) MG PO TABS
325.0000 mg | ORAL_TABLET | Freq: Two times a day (BID) | ORAL | Status: DC
  Administered 2015-02-17 – 2015-02-24 (×13): 325 mg via ORAL
  Filled 2015-02-17 (×14): qty 1

## 2015-02-17 MED ORDER — ISOSORBIDE MONONITRATE ER 60 MG PO TB24
60.0000 mg | ORAL_TABLET | Freq: Every day | ORAL | Status: DC
  Administered 2015-02-18 – 2015-02-23 (×4): 60 mg via ORAL
  Filled 2015-02-17 (×5): qty 1

## 2015-02-17 MED ORDER — LINAGLIPTIN 5 MG PO TABS
5.0000 mg | ORAL_TABLET | Freq: Every day | ORAL | Status: DC
  Administered 2015-02-19 – 2015-02-21 (×2): 5 mg via ORAL
  Filled 2015-02-17 (×6): qty 1

## 2015-02-17 NOTE — Consult Note (Signed)
Consultation  Referring Provider:      Primary Care Physician:  [name], MD Primary Gastroenterologist:         Reason for Consultation:  Anemia and melena        Impression / Plan:   GI bleeding, exacerbated by ASA/plavix use. Will need both EGD/colon scheduled. However, need to be off plavix at least 4-5 days before we can schedule endoscopies. Perhaps, can be arranged on Monday. Start clear liquid diet today. Continue to moniter CBC and transfuse accordingly, esp in light of CAD hx.          HPI:   [name] is a 59 y.o. female with dark stools ever since starting Fe, according to pt. Has osteomyelitis. Started hyperbaric O2 treatment this week. Recently, more fatigue. No SOB or CP. No other GI symptoms of nausea, vomiting, hearturn, indigestions, diarrhea, constipation, or abdominal pain. No prior PUD hx. No prior EGD or colonoscopy. Was supposed to see GI at [facility], pt cancelled due to hyperbaric treatment x 6 weeks.   Past Medical History  Diagnosis Date  . Obesity, unspecified   . Unspecified vitamin D deficiency   . Pure hypercholesterolemia   . Neuropathy 2011  . Chicken pox   . Coronary artery disease   . CHF (congestive heart failure) 07/19/2012    EF 15% by cardiac catheterization.  Admission.  . Hypertension   . Anemia   . Automatic implantable cardioverter-defibrillator in situ   . Type II diabetes mellitus   . Myocardial infarct 07/19/12    s/p cardiac catheterization with stenting to LAD.  Marland Kitchen History of blood transfusion ~ 2011    "plasma; had neuropathy; couldn't walk"  . OSA on CPAP     Moderate with AHI 23/hr and now on CPAP at 16cm H2O.  Her DME is [facility]    Past Surgical History  Procedure Laterality Date  . Lumbar disc surgery  1996    L4-5  . Incision and drainage abscess Right 2007    groin; with ICU stay due to sepsis.  . Bilateral oophorectomy  01/2011    ovarian cyst benign  . Bi-ventricular implantable cardioverter defibrillator   (crt-d)  07/06/2014  . Laparoscopic cholecystectomy  2011  . Back surgery    . Coronary angioplasty with stent placement Left 07/2012    new onset systolic CHF; elevated troponins.  Cardiac catheterization with stenting to LAD; EF 15%.  2D-echo: EF 20-25%.  . Vaginal hysterectomy  01/2011    Fibroids/DUB.  Ovaries removed. Fontaine.  . Tubal ligation  1981  . Left heart catheterization with coronary angiogram N/A 07/21/2012    Procedure: LEFT HEART CATHETERIZATION WITH CORONARY ANGIOGRAM;  Surgeon: Jolaine Artist, MD;  Location: [facility] CATH LAB;  Service: Cardiovascular;  Laterality: N/A;  . Percutaneous coronary stent intervention (pci-s) N/A 07/23/2012    Procedure: PERCUTANEOUS CORONARY STENT INTERVENTION (PCI-S);  Surgeon: Sherren Mocha, MD;  Location: [facility] CATH LAB;  Service: Cardiovascular;  Laterality: N/A;  . Bi-ventricular implantable cardioverter defibrillator N/A 07/06/2014    Procedure: BI-VENTRICULAR IMPLANTABLE CARDIOVERTER DEFIBRILLATOR  (CRT-D);  Surgeon: Deboraha Sprang, MD;  Location: [facility] CATH LAB;  Service: Cardiovascular;  Laterality: N/A;    Family History  Problem Relation Age of Onset  . Diabetes Mother   . Hypertension Mother   . Arthritis Mother     knees, lumbar DDD, cervical DDD  . Cancer Father     prostate,skin,lymphoma.  . Obesity Brother   . Diabetes Maternal Grandmother   .  Diabetes Paternal Grandmother     History  Substance Use Topics  . Smoking status: Never Smoker   . Smokeless tobacco: Never Used  . Alcohol Use: No    Prior to Admission medications   Medication Sig Start Date End Date Taking? Authorizing Provider  aspirin EC 81 MG tablet Take 1 tablet (81 mg total) by mouth daily. 03/31/14  Yes Wardell Honour, MD  carvedilol (COREG) 25 MG tablet Take 1 tablet (25 mg total) by mouth 2 (two) times daily with a meal. 12/12/14  Yes Jolaine Artist, MD  clopidogrel (PLAVIX) 75 MG tablet Take 1 tablet (75 mg total) by mouth daily. 03/31/14  Yes Wardell Honour, MD  ferrous sulfate 325 (65 FE) MG tablet Take 325 mg by mouth 2 (two) times daily with a meal.   Yes Historical Provider, MD  gabapentin (NEURONTIN) 600 MG tablet Take 1 tablet (600 mg total) by mouth 3 (three) times daily. 03/31/14  Yes Wardell Honour, MD  glimepiride (AMARYL) 4 MG tablet Take 1 tablet (4 mg total) by mouth 2 (two) times daily before a meal. 03/31/14  Yes Wardell Honour, MD  hydrALAZINE (APRESOLINE) 50 MG tablet Take 1 tablet (50 mg total) by mouth 3 (three) times daily. 03/31/14  Yes Wardell Honour, MD  isosorbide mononitrate (IMDUR) 60 MG 24 hr tablet Take 1 tablet (60 mg total) by mouth daily. 12/12/14  Yes Jolaine Artist, MD  metFORMIN (GLUCOPHAGE) 1000 MG tablet Take 1 tablet (1,000 mg total) by mouth daily. 01/10/15  Yes Leandrew Koyanagi, MD  nitroGLYCERIN (NITROSTAT) 0.4 MG SL tablet Place 1 tablet (0.4 mg total) under the tongue every 5 (five) minutes as needed for chest pain. 07/26/12  Yes Coral Spikes, DO  nortriptyline (PAMELOR) 50 MG capsule Take 1 capsule (50 mg total) by mouth at bedtime. 03/31/14  Yes Wardell Honour, MD  pravastatin (PRAVACHOL) 40 MG tablet Take 1 tablet (40 mg total) by mouth at bedtime. 03/31/14  Yes Wardell Honour, MD  sitaGLIPtin (JANUVIA) 100 MG tablet Take 1 tablet (100 mg total) by mouth daily. 01/10/15  Yes Leandrew Koyanagi, MD  torsemide (DEMADEX) 20 MG tablet Take 2 tablets (40 mg total) by mouth daily. 12/19/14  Yes Jolaine Artist, MD  traMADol (ULTRAM) 50 MG tablet Take 1 tablet (50 mg total) by mouth every 6 (six) hours as needed. For pain 01/16/15  Yes Wardell Honour, MD  vancomycin in sodium chloride 0.9 % 500 mL Inject into the vein daily.   Yes Historical Provider, MD  doxycycline (VIBRAMYCIN) 100 MG capsule Take 1 capsule (100 mg total) by mouth 2 (two) times daily. Patient not taking: Reported on 02/17/2015 01/16/15   Wardell Honour, MD  fluticasone [facility]) 50 MCG/ACT nasal spray 1 spray each nostril twice a day Patient  not taking: Reported on 02/17/2015 12/30/14   Leandrew Koyanagi, MD  glucose blood test strip Test blood sugar daily. Dx code: 250.00 03/17/14   Wendie Agreste, MD  levofloxacin (LEVAQUIN) 500 MG tablet Take 1 tablet (500 mg total) by mouth daily. Patient not taking: Reported on 02/17/2015 01/16/15   Wardell Honour, MD    Current Facility-Administered Medications  Medication Dose Route Frequency Provider Last Rate Last Dose  . 0.9 %  sodium chloride infusion   Intravenous Once Bettey Costa, MD      . acetaminophen (TYLENOL) tablet 650 mg  650 mg Oral Q6H PRN Bettey Costa, MD  Or  . acetaminophen (TYLENOL) suppository 650 mg  650 mg Rectal Q6H PRN Bettey Costa, MD      . alum & mag hydroxide-simeth (MAALOX/MYLANTA) 200-200-20 MG/5ML suspension 30 mL  30 mL Oral Q6H PRN Bettey Costa, MD      . carvedilol (COREG) tablet 25 mg  25 mg Oral BID WC Sital Mody, MD      . doxycycline (VIBRAMYCIN) capsule 100 mg  100 mg Oral BID Bettey Costa, MD      . ferrous sulfate tablet 325 mg  325 mg Oral BID WC Sital Mody, MD      . gabapentin (NEURONTIN) tablet 600 mg  600 mg Oral TID Bettey Costa, MD      . glimepiride (AMARYL) tablet 4 mg  4 mg Oral BID AC Sital Mody, MD      . hydrALAZINE (APRESOLINE) tablet 50 mg  50 mg Oral TID Bettey Costa, MD      . insulin aspart (novoLOG) injection 0-9 Units  0-9 Units Subcutaneous TID WC Sital Mody, MD      . isosorbide mononitrate (IMDUR) 24 hr tablet 60 mg  60 mg Oral Daily Sital Mody, MD      . levofloxacin (LEVAQUIN) tablet 500 mg  500 mg Oral Daily Sital Mody, MD      . linagliptin (TRADJENTA) tablet 5 mg  5 mg Oral Daily Sital Mody, MD      . nitroGLYCERIN (NITROSTAT) SL tablet 0.4 mg  0.4 mg Sublingual Q5 min PRN Bettey Costa, MD      . nortriptyline (PAMELOR) capsule 50 mg  50 mg Oral QHS Sital Mody, MD      . ondansetron (ZOFRAN) tablet 4 mg  4 mg Oral Q6H PRN Bettey Costa, MD       Or  . ondansetron (ZOFRAN) injection 4 mg  4 mg Intravenous Q6H PRN Sital Mody, MD       . pantoprazole (PROTONIX) injection 40 mg  40 mg Intravenous Q12H Sital Mody, MD      . pravastatin (PRAVACHOL) tablet 40 mg  40 mg Oral QHS Sital Mody, MD      . senna-docusate (Senokot-S) tablet 1 tablet  1 tablet Oral QHS PRN Bettey Costa, MD      . torsemide (DEMADEX) tablet 40 mg  40 mg Oral Daily Bettey Costa, MD      . Derrill Memo ON 02/18/2015] vancomycin (VANCOCIN) 1,500 mg in sodium chloride 0.9 % 500 mL IVPB  1,500 mg Intravenous Q24H Bettey Costa, MD        Allergies as of 02/17/2015  . (No Known Allergies)     Review of Systems:    This is positive for those things mentioned in the HPI, also positive for fatigue/weakness. All other review of systems are negative.       Physical Exam:  Vital signs in last 24 hours: Temp:  [97.6 F (36.4 C)-97.9 F (36.6 C)] 97.9 F (36.6 C) (05/13 1307) Pulse Rate:  [72-75] 75 (05/13 1307) Resp:  [17-21] 21 (05/13 1130) BP: (110-118)/(53-60) 117/57 mmHg (05/13 1307) SpO2:  [92 %-96 %] 93 % (05/13 1130) Weight:  [118.899 kg (262 lb 2 oz)-166.924 kg (368 lb)] 118.899 kg (262 lb 2 oz) (05/13 1307)    General:  Well-developed, well-nourished and in no acute distress Eyes:  anicteric. ENT:   Mouth and posterior pharynx free of lesions.  Neck:   supple w/o thyromegaly or mass.  Lungs: Clear to auscultation bilaterally. Heart:  S1S2, no rubs,  murmurs, gallops. Abdomen:  soft, non-tender, no hepatosplenomegaly, hernia, or mass and BS+.  Rectal: Lymph:  no cervical or supraclavicular adenopathy. Extremities:   no edema Skin   no rash. Neuro:  A&O x 3.  Psych:  appropriate mood and  Affect.   Data Reviewed: 02/17/2015   LAB RESULTS:  Recent Labs  02/17/15 1059  WBC 4.0  HGB 6.7*  HCT 21.5*  PLT 259   BMET  Recent Labs  02/17/15 1059  NA 131*  K 3.8  CL 96*  CO2 26  GLUCOSE 134*  BUN 49*  CREATININE 2.06*  CALCIUM 8.0*   LFT No results for input(s): PROT, ALBUMIN, AST, ALT, ALKPHOS, BILITOT, BILIDIR, IBILI in the last  72 hours. PT/INR No results for input(s): LABPROT, INR in the last 72 hours.  STUDIES: No results found.   PREVIOUS ENDOSCOPIES:  none   Thanks   LOS: 0 days   @Bradleigh Sonnen  Y. Verdine Grenfell MD, [facility] @  02/17/2015, 3:55 PM

## 2015-02-17 NOTE — Progress Notes (Signed)
[id number] Patient Account Number: [id number] Date of Birth/Sex: [birth year] (59 y.o. Female) Treating RN: Primary Care Physician: [name] Other Clinician: Referring Physician: [name] Treating Physician/Extender: Frann Rider in Treatment: 5 Subjective Chief Complaint Information obtained from Patient Patient presents to the wound care center for a consult due non healing wound 59 year old patient who comes with a history of an ulcer on the left second toe for at least about 4 months and a history on her left heel for about 3 weeks. I needed to get an MRI of her left foot but due to her defibrillator this could not be done and hence she is getting get a triple phase bone scan. 01/17/2015 -- she has developed a significant injury to the bridge of the nose where her CPAP machine was fitting. This is a painfull dark spot. History of Present Illness (HPI) 59 year old patient who is known to have diabetes for several years and generally has it under good control last hemoglobin A1c was 6.7 done 2 weeks ago. She has several other comorbidities including  acute CHF, hypoxia, hyperlipidemia, systolic heart failure, myocardial infarction, coronary artery disease, iron deficiency anemia, renal insufficiency, essential hypertension, morbid obesity and obstructive sleep apnea. 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 In the past she's had a coronary angioplasty with stent in 2013, laparoscopic cholecystectomy in 2011, bilateral oophorectomy in April 2012 and she's had some lumbar disc surgery in 1996. She says she's had a lot of peripheral neuropathy and due to this she's had a ulcer on her left second toe for a while. She had a dry eschar on her left heel and recently this was removed and she found that she had some infection and some drainage from there. She has minimal pain around the heel but no pain on any areas of her toes. she has not been on any antibiotics recently and has known vascular problems in the recent past. 01/17/2015 left foot x-ray done on April 5 reveals there are destructive changes noted of the distal aspect of the proximal phalanx of the left second digit with extension into the adjacent proximal interphalangeal joint space. These findings suggest osteomyelitis with possible septic arthritis. I was able to make a call to Dr. Nevada Crane the radiologist, and had a detailed discussion with him regarding this x-ray. He noted that there were also some changes in the fifth phalanx on the left foot and this could have been chronic osteomyelitis too. The second toe on the left leg shows findings which cannot differentiate between an acute or chronic osteomyelitis and hence he has recommended a MRI with and without contrast. We will get this organized for her and in the meanwhile treat her with oral antibiotics which is susceptible to Bactrim. she was seen yesterday by her PCP Dr. Chaya Jan who put her on 3 antibiotics which include Levaquin and doxycycline and Bactrim. 01/24/2015 She  is scheduled for a triple PHASE bone scan on 01/26/2015. She is still on antibiotics and otherwise doing well. [name], [name] ([id number]) 01/31/2015 Bone Imaging 3 phase study -- IMPRESSION:1. There is increased uptake on all 3 phases localizing to the left hindfoot region. This may be a manifestation of osteomyelitis. Correlation with exact site of nonhealing ulcer. 2. There is increased uptake localizing to the second phalanx of the left foot on the blood pool and delayed phase images. 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 Underlying infection not excluded.  Adriamycin, disulfiram, cisplatin and sulfamylon and is not currently receiving any chemotherapy. HBO contraindications of hyperbaric oxygen therapy were reviewed and the patient found to have no Upper respiratory infection and chronic sinusitis. HBO contraindications of hyperbaric oxygen therapy were reviewed and the patient found to have no history of retinal surgery proceeding 6 weeks or intraocular gas HBO contraindications of hyperbaric oxygen therapy were reviewed and the patient found to have no history seizure disorder or any anticonvulsant medication. HBO contraindications of hyperbaric oxygen therapy were reviewed and the patient found to have no septicemia with CO2 retention. HBO contraindications of hyperbaric oxygen therapy were reviewed and the patient found to have no fever greater than 100 degrees. HBO contraindications of hyperbaric oxygen therapy were reviewed and the patient found to have no pregnancy noted. HBO contraindications of hyperbaric oxygen therapy were reviewed and the patient found to have no medications  such as steroids or narcotics or Phenergan. Medications-please add to medication list.: Wound #1 Left Calcaneous: P.O. Antibiotics - start antibiotics ordered by Dr. Ola Spurr We will use silver alginate on the left calcaneum and pain the bridge of the nose and the left second toe with Betadine. She will continue with HBOT and continue with wound care as planned. I will see her back next week for a wound check. Electronic Signature(s) Signed: 02/16/2015 4:49:36 PM By: Christin Fudge MD, FACS Entered By: Christin Fudge on 02/16/2015 16:08:36 [name] ([id number]) -------------------------------------------------------------------------------- SuperBill Details Patient Name: [name] Date of Service: 02/16/2015 Medical Record Number: [id number] Patient Account Number: [id number] Date of Birth/Sex: 09-30-1956 (59 y.o. 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, left ankle and foot Facility Procedures CPT4 Code Description: [id number] - DEBRIDE WOUND 1ST 20 SQ CM OR < ICD-10 Description Diagnosis E11.621 Type 2 diabetes mellitus with foot ulcer L97.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 Female) Treating RN: Primary Care Physician: [name] Other Clinician: Referring Physician: [name] Treating Physician/Extender: Frann Rider in Treatment: 5 Subjective Chief Complaint Information obtained from Patient Patient presents to the wound care center for a consult due non healing wound 59 year old patient who comes with a history of an ulcer on the left second toe for at least about 4 months and a history on her left heel for about 3 weeks. I needed to get an MRI of her left foot but due to her defibrillator this could not be done and hence she is getting get a triple phase bone scan. 01/17/2015 -- she has developed a significant injury to the bridge of the nose where her CPAP machine was fitting. This is a painfull dark spot. History of Present Illness (HPI) 59 year old patient who is known to have diabetes for several years and generally has it under good control last hemoglobin A1c was 6.7 done 2 weeks ago. She has several other comorbidities including  acute CHF, hypoxia, hyperlipidemia, systolic heart failure, myocardial infarction, coronary artery disease, iron deficiency anemia, renal insufficiency, essential hypertension, morbid obesity and obstructive sleep apnea. 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 In the past she's had a coronary angioplasty with stent in 2013, laparoscopic cholecystectomy in 2011, bilateral oophorectomy in April 2012 and she's had some lumbar disc surgery in 1996. She says she's had a lot of peripheral neuropathy and due to this she's had a ulcer on her left second toe for a while. She had a dry eschar on her left heel and recently this was removed and she found that she had some infection and some drainage from there. She has minimal pain around the heel but no pain on any areas of her toes. she has not been on any antibiotics recently and has known vascular problems in the recent past. 01/17/2015 left foot x-ray done on April 5 reveals there are destructive changes noted of the distal aspect of the proximal phalanx of the left second digit with extension into the adjacent proximal interphalangeal joint space. These findings suggest osteomyelitis with possible septic arthritis. I was able to make a call to Dr. Nevada Crane the radiologist, and had a detailed discussion with him regarding this x-ray. He noted that there were also some changes in the fifth phalanx on the left foot and this could have been chronic osteomyelitis too. The second toe on the left leg shows findings which cannot differentiate between an acute or chronic osteomyelitis and hence he has recommended a MRI with and without contrast. We will get this organized for her and in the meanwhile treat her with oral antibiotics which is susceptible to Bactrim. she was seen yesterday by her PCP Dr. Chaya Jan who put her on 3 antibiotics which include Levaquin and doxycycline and Bactrim. 01/24/2015 She  is scheduled for a triple PHASE bone scan on 01/26/2015. She is still on antibiotics and otherwise doing well. [name], [name] ([id number]) 01/31/2015 Bone Imaging 3 phase study -- IMPRESSION:1. There is increased uptake on all 3 phases localizing to the left hindfoot region. This may be a manifestation of osteomyelitis. Correlation with exact site of nonhealing ulcer. 2. There is increased uptake localizing to the second phalanx of the left foot on the blood pool and delayed phase images. 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 Underlying infection not excluded.  [name], [name] ([id number]) Visit Report for 02/16/2015 Chief Complaint Document Details Patient Name: [name], [name] Date of Service: 02/16/2015 3:00 PM Medical Record Number: [id number] Patient Account Number: [id number] Date of Birth/Sex: 08-31-1956 (59 y.o. Female) Treating RN: Primary Care Physician: [name] Other Clinician: Referring Physician: [name] Treating Physician/Extender: Frann Rider in Treatment: 5 Information Obtained from: Patient Chief Complaint Patient presents to the wound care center for a consult due non healing wound 59 year old patient who comes with a history of an ulcer on the left second toe for at least about 4 months and a history on her left heel for about 3 weeks. I needed to get an MRI of her left foot but due to her defibrillator this could not be done and hence she is getting get a triple phase bone scan. 01/17/2015 -- she has developed a significant injury to the bridge of the nose where her CPAP machine was fitting. This is a painfull dark spot. Electronic Signature(s) Signed: 02/16/2015 4:49:36 PM By: Christin Fudge MD, FACS Entered By: Christin Fudge on 02/16/2015 15:57:59 [name] ([id number]) -------------------------------------------------------------------------------- Debridement Details Patient Name: [name] Date of Service: 02/16/2015 3:00 PM Medical Record Number: [id number] Patient Account Number: [id number] Date of Birth/Sex: 1956-01-02 (59 y.o. Female) Treating RN: Primary Care Physician: [name] Other Clinician: Referring Physician: [name] Treating Physician/Extender: Frann Rider in Treatment: 5 Debridement Performed for Wound #1 Left Calcaneous Assessment: Performed By: Physician Pat Patrick., MD Debridement: Open Wound/Selective Debridement Selective Description: Pre-procedure Yes Verification/Time Out Taken: Start Time: 15:36 Pain Control: Lidocaine 4%  Topical Solution Level: Non-Viable Tissue Total Area Debrided (L x 2 (cm) x 1.8 (cm) = 3.6 (cm) W): Tissue and other Non-Viable, Eschar, Exudate, Fibrin/Slough, Skin material debrided: Instrument: Forceps, Scissors Bleeding: Minimum Hemostasis Achieved: Pressure End Time: 15:41 Procedural Pain: 0 Post Procedural Pain: 0 Response to Treatment: Procedure was tolerated well Post Debridement Measurements of Total Wound Length: (cm) 2 Width: (cm) 1.8 Depth: (cm) 0.3 Volume: (cm) 0.848 Electronic Signature(s) Signed: 02/16/2015 4:49:36 PM By: Christin Fudge MD, FACS Entered By: Christin Fudge on 02/16/2015 15:57:49 [name] ([id number]) -------------------------------------------------------------------------------- HPI Details Patient Name: [name] Date of Service: 02/16/2015 3:00 PM Medical Record Number: [id number] Patient Account Number: [id number] Date of Birth/Sex: [birth year] (59 y.o. Female) Treating RN: Primary Care Physician: [name] Other Clinician: Referring Physician: [name] Treating Physician/Extender: Frann Rider in Treatment: 5 History of Present Illness HPI Description: 59 year old patient who is known to have diabetes for several years and generally has it under good control last hemoglobin A1c was 6.7 done 2 weeks ago. She has several other comorbidities including acute CHF, hypoxia, hyperlipidemia, systolic heart failure, myocardial infarction, coronary artery disease, iron deficiency anemia, renal insufficiency, essential hypertension, morbid obesity and obstructive sleep apnea. 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 In the past she's had a coronary angioplasty with stent in 2013, laparoscopic cholecystectomy in 2011, bilateral oophorectomy in April 2012 and she's had some lumbar disc surgery in 1996. She says she's had a lot of peripheral neuropathy and due to this  she's had a ulcer on her left second toe for a while. She had a dry eschar on her left heel and recently this was removed and she found that she had some infection and some drainage from there. She has minimal pain around the heel but no pain on any areas of her toes. she has not been on  [name], [name] ([id number]) Visit Report for 02/16/2015 Chief Complaint Document Details Patient Name: [name], [name] Date of Service: 02/16/2015 3:00 PM Medical Record Number: [id number] Patient Account Number: [id number] Date of Birth/Sex: 08-31-1956 (59 y.o. Female) Treating RN: Primary Care Physician: [name] Other Clinician: Referring Physician: [name] Treating Physician/Extender: Frann Rider in Treatment: 5 Information Obtained from: Patient Chief Complaint Patient presents to the wound care center for a consult due non healing wound 59 year old patient who comes with a history of an ulcer on the left second toe for at least about 4 months and a history on her left heel for about 3 weeks. I needed to get an MRI of her left foot but due to her defibrillator this could not be done and hence she is getting get a triple phase bone scan. 01/17/2015 -- she has developed a significant injury to the bridge of the nose where her CPAP machine was fitting. This is a painfull dark spot. Electronic Signature(s) Signed: 02/16/2015 4:49:36 PM By: Christin Fudge MD, FACS Entered By: Christin Fudge on 02/16/2015 15:57:59 [name] ([id number]) -------------------------------------------------------------------------------- Debridement Details Patient Name: [name] Date of Service: 02/16/2015 3:00 PM Medical Record Number: [id number] Patient Account Number: [id number] Date of Birth/Sex: 1956-01-02 (59 y.o. Female) Treating RN: Primary Care Physician: [name] Other Clinician: Referring Physician: [name] Treating Physician/Extender: Frann Rider in Treatment: 5 Debridement Performed for Wound #1 Left Calcaneous Assessment: Performed By: Physician Pat Patrick., MD Debridement: Open Wound/Selective Debridement Selective Description: Pre-procedure Yes Verification/Time Out Taken: Start Time: 15:36 Pain Control: Lidocaine 4%  Topical Solution Level: Non-Viable Tissue Total Area Debrided (L x 2 (cm) x 1.8 (cm) = 3.6 (cm) W): Tissue and other Non-Viable, Eschar, Exudate, Fibrin/Slough, Skin material debrided: Instrument: Forceps, Scissors Bleeding: Minimum Hemostasis Achieved: Pressure End Time: 15:41 Procedural Pain: 0 Post Procedural Pain: 0 Response to Treatment: Procedure was tolerated well Post Debridement Measurements of Total Wound Length: (cm) 2 Width: (cm) 1.8 Depth: (cm) 0.3 Volume: (cm) 0.848 Electronic Signature(s) Signed: 02/16/2015 4:49:36 PM By: Christin Fudge MD, FACS Entered By: Christin Fudge on 02/16/2015 15:57:49 [name] ([id number]) -------------------------------------------------------------------------------- HPI Details Patient Name: [name] Date of Service: 02/16/2015 3:00 PM Medical Record Number: [id number] Patient Account Number: [id number] Date of Birth/Sex: [birth year] (59 y.o. Female) Treating RN: Primary Care Physician: [name] Other Clinician: Referring Physician: [name] Treating Physician/Extender: Frann Rider in Treatment: 5 History of Present Illness HPI Description: 59 year old patient who is known to have diabetes for several years and generally has it under good control last hemoglobin A1c was 6.7 done 2 weeks ago. She has several other comorbidities including acute CHF, hypoxia, hyperlipidemia, systolic heart failure, myocardial infarction, coronary artery disease, iron deficiency anemia, renal insufficiency, essential hypertension, morbid obesity and obstructive sleep apnea. 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 In the past she's had a coronary angioplasty with stent in 2013, laparoscopic cholecystectomy in 2011, bilateral oophorectomy in April 2012 and she's had some lumbar disc surgery in 1996. She says she's had a lot of peripheral neuropathy and due to this  she's had a ulcer on her left second toe for a while. She had a dry eschar on her left heel and recently this was removed and she found that she had some infection and some drainage from there. She has minimal pain around the heel but no pain on any areas of her toes. she has not been on  [id number] Patient Account Number: [id number] Date of Birth/Sex: [birth year] (59 y.o. Female) Treating RN: Primary Care Physician: [name] Other Clinician: Referring Physician: [name] Treating Physician/Extender: Frann Rider in Treatment: 5 Subjective Chief Complaint Information obtained from Patient Patient presents to the wound care center for a consult due non healing wound 59 year old patient who comes with a history of an ulcer on the left second toe for at least about 4 months and a history on her left heel for about 3 weeks. I needed to get an MRI of her left foot but due to her defibrillator this could not be done and hence she is getting get a triple phase bone scan. 01/17/2015 -- she has developed a significant injury to the bridge of the nose where her CPAP machine was fitting. This is a painfull dark spot. History of Present Illness (HPI) 59 year old patient who is known to have diabetes for several years and generally has it under good control last hemoglobin A1c was 6.7 done 2 weeks ago. She has several other comorbidities including  acute CHF, hypoxia, hyperlipidemia, systolic heart failure, myocardial infarction, coronary artery disease, iron deficiency anemia, renal insufficiency, essential hypertension, morbid obesity and obstructive sleep apnea. 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 In the past she's had a coronary angioplasty with stent in 2013, laparoscopic cholecystectomy in 2011, bilateral oophorectomy in April 2012 and she's had some lumbar disc surgery in 1996. She says she's had a lot of peripheral neuropathy and due to this she's had a ulcer on her left second toe for a while. She had a dry eschar on her left heel and recently this was removed and she found that she had some infection and some drainage from there. She has minimal pain around the heel but no pain on any areas of her toes. she has not been on any antibiotics recently and has known vascular problems in the recent past. 01/17/2015 left foot x-ray done on April 5 reveals there are destructive changes noted of the distal aspect of the proximal phalanx of the left second digit with extension into the adjacent proximal interphalangeal joint space. These findings suggest osteomyelitis with possible septic arthritis. I was able to make a call to Dr. Nevada Crane the radiologist, and had a detailed discussion with him regarding this x-ray. He noted that there were also some changes in the fifth phalanx on the left foot and this could have been chronic osteomyelitis too. The second toe on the left leg shows findings which cannot differentiate between an acute or chronic osteomyelitis and hence he has recommended a MRI with and without contrast. We will get this organized for her and in the meanwhile treat her with oral antibiotics which is susceptible to Bactrim. she was seen yesterday by her PCP Dr. Chaya Jan who put her on 3 antibiotics which include Levaquin and doxycycline and Bactrim. 01/24/2015 She  is scheduled for a triple PHASE bone scan on 01/26/2015. She is still on antibiotics and otherwise doing well. [name], [name] ([id number]) 01/31/2015 Bone Imaging 3 phase study -- IMPRESSION:1. There is increased uptake on all 3 phases localizing to the left hindfoot region. This may be a manifestation of osteomyelitis. Correlation with exact site of nonhealing ulcer. 2. There is increased uptake localizing to the second phalanx of the left foot on the blood pool and delayed phase images. 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 Underlying infection not excluded.  [name], [name] ([id number]) Visit Report for 02/16/2015 Chief Complaint Document Details Patient Name: [name], [name] Date of Service: 02/16/2015 3:00 PM Medical Record Number: [id number] Patient Account Number: [id number] Date of Birth/Sex: 08-31-1956 (59 y.o. Female) Treating RN: Primary Care Physician: [name] Other Clinician: Referring Physician: [name] Treating Physician/Extender: Frann Rider in Treatment: 5 Information Obtained from: Patient Chief Complaint Patient presents to the wound care center for a consult due non healing wound 59 year old patient who comes with a history of an ulcer on the left second toe for at least about 4 months and a history on her left heel for about 3 weeks. I needed to get an MRI of her left foot but due to her defibrillator this could not be done and hence she is getting get a triple phase bone scan. 01/17/2015 -- she has developed a significant injury to the bridge of the nose where her CPAP machine was fitting. This is a painfull dark spot. Electronic Signature(s) Signed: 02/16/2015 4:49:36 PM By: Christin Fudge MD, FACS Entered By: Christin Fudge on 02/16/2015 15:57:59 [name] ([id number]) -------------------------------------------------------------------------------- Debridement Details Patient Name: [name] Date of Service: 02/16/2015 3:00 PM Medical Record Number: [id number] Patient Account Number: [id number] Date of Birth/Sex: 1956-01-02 (59 y.o. Female) Treating RN: Primary Care Physician: [name] Other Clinician: Referring Physician: [name] Treating Physician/Extender: Frann Rider in Treatment: 5 Debridement Performed for Wound #1 Left Calcaneous Assessment: Performed By: Physician Pat Patrick., MD Debridement: Open Wound/Selective Debridement Selective Description: Pre-procedure Yes Verification/Time Out Taken: Start Time: 15:36 Pain Control: Lidocaine 4%  Topical Solution Level: Non-Viable Tissue Total Area Debrided (L x 2 (cm) x 1.8 (cm) = 3.6 (cm) W): Tissue and other Non-Viable, Eschar, Exudate, Fibrin/Slough, Skin material debrided: Instrument: Forceps, Scissors Bleeding: Minimum Hemostasis Achieved: Pressure End Time: 15:41 Procedural Pain: 0 Post Procedural Pain: 0 Response to Treatment: Procedure was tolerated well Post Debridement Measurements of Total Wound Length: (cm) 2 Width: (cm) 1.8 Depth: (cm) 0.3 Volume: (cm) 0.848 Electronic Signature(s) Signed: 02/16/2015 4:49:36 PM By: Christin Fudge MD, FACS Entered By: Christin Fudge on 02/16/2015 15:57:49 [name] ([id number]) -------------------------------------------------------------------------------- HPI Details Patient Name: [name] Date of Service: 02/16/2015 3:00 PM Medical Record Number: [id number] Patient Account Number: [id number] Date of Birth/Sex: [birth year] (59 y.o. Female) Treating RN: Primary Care Physician: [name] Other Clinician: Referring Physician: [name] Treating Physician/Extender: Frann Rider in Treatment: 5 History of Present Illness HPI Description: 59 year old patient who is known to have diabetes for several years and generally has it under good control last hemoglobin A1c was 6.7 done 2 weeks ago. She has several other comorbidities including acute CHF, hypoxia, hyperlipidemia, systolic heart failure, myocardial infarction, coronary artery disease, iron deficiency anemia, renal insufficiency, essential hypertension, morbid obesity and obstructive sleep apnea. 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 In the past she's had a coronary angioplasty with stent in 2013, laparoscopic cholecystectomy in 2011, bilateral oophorectomy in April 2012 and she's had some lumbar disc surgery in 1996. She says she's had a lot of peripheral neuropathy and due to this  she's had a ulcer on her left second toe for a while. She had a dry eschar on her left heel and recently this was removed and she found that she had some infection and some drainage from there. She has minimal pain around the heel but no pain on any areas of her toes. she has not been on  [name], [name] ([id number]) Visit Report for 02/16/2015 Chief Complaint Document Details Patient Name: [name], [name] Date of Service: 02/16/2015 3:00 PM Medical Record Number: [id number] Patient Account Number: [id number] Date of Birth/Sex: 08-31-1956 (59 y.o. Female) Treating RN: Primary Care Physician: [name] Other Clinician: Referring Physician: [name] Treating Physician/Extender: Frann Rider in Treatment: 5 Information Obtained from: Patient Chief Complaint Patient presents to the wound care center for a consult due non healing wound 59 year old patient who comes with a history of an ulcer on the left second toe for at least about 4 months and a history on her left heel for about 3 weeks. I needed to get an MRI of her left foot but due to her defibrillator this could not be done and hence she is getting get a triple phase bone scan. 01/17/2015 -- she has developed a significant injury to the bridge of the nose where her CPAP machine was fitting. This is a painfull dark spot. Electronic Signature(s) Signed: 02/16/2015 4:49:36 PM By: Christin Fudge MD, FACS Entered By: Christin Fudge on 02/16/2015 15:57:59 [name] ([id number]) -------------------------------------------------------------------------------- Debridement Details Patient Name: [name] Date of Service: 02/16/2015 3:00 PM Medical Record Number: [id number] Patient Account Number: [id number] Date of Birth/Sex: 1956-01-02 (59 y.o. Female) Treating RN: Primary Care Physician: [name] Other Clinician: Referring Physician: [name] Treating Physician/Extender: Frann Rider in Treatment: 5 Debridement Performed for Wound #1 Left Calcaneous Assessment: Performed By: Physician Pat Patrick., MD Debridement: Open Wound/Selective Debridement Selective Description: Pre-procedure Yes Verification/Time Out Taken: Start Time: 15:36 Pain Control: Lidocaine 4%  Topical Solution Level: Non-Viable Tissue Total Area Debrided (L x 2 (cm) x 1.8 (cm) = 3.6 (cm) W): Tissue and other Non-Viable, Eschar, Exudate, Fibrin/Slough, Skin material debrided: Instrument: Forceps, Scissors Bleeding: Minimum Hemostasis Achieved: Pressure End Time: 15:41 Procedural Pain: 0 Post Procedural Pain: 0 Response to Treatment: Procedure was tolerated well Post Debridement Measurements of Total Wound Length: (cm) 2 Width: (cm) 1.8 Depth: (cm) 0.3 Volume: (cm) 0.848 Electronic Signature(s) Signed: 02/16/2015 4:49:36 PM By: Christin Fudge MD, FACS Entered By: Christin Fudge on 02/16/2015 15:57:49 [name] ([id number]) -------------------------------------------------------------------------------- HPI Details Patient Name: [name] Date of Service: 02/16/2015 3:00 PM Medical Record Number: [id number] Patient Account Number: [id number] Date of Birth/Sex: [birth year] (59 y.o. Female) Treating RN: Primary Care Physician: [name] Other Clinician: Referring Physician: [name] Treating Physician/Extender: Frann Rider in Treatment: 5 History of Present Illness HPI Description: 59 year old patient who is known to have diabetes for several years and generally has it under good control last hemoglobin A1c was 6.7 done 2 weeks ago. She has several other comorbidities including acute CHF, hypoxia, hyperlipidemia, systolic heart failure, myocardial infarction, coronary artery disease, iron deficiency anemia, renal insufficiency, essential hypertension, morbid obesity and obstructive sleep apnea. 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 In the past she's had a coronary angioplasty with stent in 2013, laparoscopic cholecystectomy in 2011, bilateral oophorectomy in April 2012 and she's had some lumbar disc surgery in 1996. She says she's had a lot of peripheral neuropathy and due to this  she's had a ulcer on her left second toe for a while. She had a dry eschar on her left heel and recently this was removed and she found that she had some infection and some drainage from there. She has minimal pain around the heel but no pain on any areas of her toes. she has not been on  Adriamycin, disulfiram, cisplatin and sulfamylon and is not currently receiving any chemotherapy. HBO contraindications of hyperbaric oxygen therapy were reviewed and the patient found to have no Upper respiratory infection and chronic sinusitis. HBO contraindications of hyperbaric oxygen therapy were reviewed and the patient found to have no history of retinal surgery proceeding 6 weeks or intraocular gas HBO contraindications of hyperbaric oxygen therapy were reviewed and the patient found to have no history seizure disorder or any anticonvulsant medication. HBO contraindications of hyperbaric oxygen therapy were reviewed and the patient found to have no septicemia with CO2 retention. HBO contraindications of hyperbaric oxygen therapy were reviewed and the patient found to have no fever greater than 100 degrees. HBO contraindications of hyperbaric oxygen therapy were reviewed and the patient found to have no pregnancy noted. HBO contraindications of hyperbaric oxygen therapy were reviewed and the patient found to have no medications  such as steroids or narcotics or Phenergan. Medications-please add to medication list.: Wound #1 Left Calcaneous: P.O. Antibiotics - start antibiotics ordered by Dr. Ola Spurr We will use silver alginate on the left calcaneum and pain the bridge of the nose and the left second toe with Betadine. She will continue with HBOT and continue with wound care as planned. I will see her back next week for a wound check. Electronic Signature(s) Signed: 02/16/2015 4:49:36 PM By: Christin Fudge MD, FACS Entered By: Christin Fudge on 02/16/2015 16:08:36 [name] ([id number]) -------------------------------------------------------------------------------- SuperBill Details Patient Name: [name] Date of Service: 02/16/2015 Medical Record Number: [id number] Patient Account Number: [id number] Date of Birth/Sex: 09-30-1956 (59 y.o. 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, left ankle and foot Facility Procedures CPT4 Code Description: [id number] - DEBRIDE WOUND 1ST 20 SQ CM OR < ICD-10 Description Diagnosis E11.621 Type 2 diabetes mellitus with foot ulcer L97.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 Female) Treating RN: Primary Care Physician: [name] Other Clinician: Referring Physician: [name] Treating Physician/Extender: Frann Rider in Treatment: 5 Subjective Chief Complaint Information obtained from Patient Patient presents to the wound care center for a consult due non healing wound 59 year old patient who comes with a history of an ulcer on the left second toe for at least about 4 months and a history on her left heel for about 3 weeks. I needed to get an MRI of her left foot but due to her defibrillator this could not be done and hence she is getting get a triple phase bone scan. 01/17/2015 -- she has developed a significant injury to the bridge of the nose where her CPAP machine was fitting. This is a painfull dark spot. History of Present Illness (HPI) 59 year old patient who is known to have diabetes for several years and generally has it under good control last hemoglobin A1c was 6.7 done 2 weeks ago. She has several other comorbidities including  acute CHF, hypoxia, hyperlipidemia, systolic heart failure, myocardial infarction, coronary artery disease, iron deficiency anemia, renal insufficiency, essential hypertension, morbid obesity and obstructive sleep apnea. 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 In the past she's had a coronary angioplasty with stent in 2013, laparoscopic cholecystectomy in 2011, bilateral oophorectomy in April 2012 and she's had some lumbar disc surgery in 1996. She says she's had a lot of peripheral neuropathy and due to this she's had a ulcer on her left second toe for a while. She had a dry eschar on her left heel and recently this was removed and she found that she had some infection and some drainage from there. She has minimal pain around the heel but no pain on any areas of her toes. she has not been on any antibiotics recently and has known vascular problems in the recent past. 01/17/2015 left foot x-ray done on April 5 reveals there are destructive changes noted of the distal aspect of the proximal phalanx of the left second digit with extension into the adjacent proximal interphalangeal joint space. These findings suggest osteomyelitis with possible septic arthritis. I was able to make a call to Dr. Nevada Crane the radiologist, and had a detailed discussion with him regarding this x-ray. He noted that there were also some changes in the fifth phalanx on the left foot and this could have been chronic osteomyelitis too. The second toe on the left leg shows findings which cannot differentiate between an acute or chronic osteomyelitis and hence he has recommended a MRI with and without contrast. We will get this organized for her and in the meanwhile treat her with oral antibiotics which is susceptible to Bactrim. she was seen yesterday by her PCP Dr. Chaya Jan who put her on 3 antibiotics which include Levaquin and doxycycline and Bactrim. 01/24/2015 She  is scheduled for a triple PHASE bone scan on 01/26/2015. She is still on antibiotics and otherwise doing well. [name], [name] ([id number]) 01/31/2015 Bone Imaging 3 phase study -- IMPRESSION:1. There is increased uptake on all 3 phases localizing to the left hindfoot region. This may be a manifestation of osteomyelitis. Correlation with exact site of nonhealing ulcer. 2. There is increased uptake localizing to the second phalanx of the left foot on the blood pool and delayed phase images. 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 Underlying infection not excluded.

## 2015-02-17 NOTE — Progress Notes (Signed)
ANTIBIOTIC CONSULT NOTE - INITIAL  Pharmacy Consult for Vancomycin Indication: Osteomyelitis  No Known Allergies  Patient Measurements: Height: 5\' 8"  (172.7 cm) Weight: 262 lb 2 oz (118.899 kg) IBW/kg (Calculated) : 63.9 Adjusted Body Weight:   Vital Signs: Temp: 97.9 F (36.6 C) (05/13 1307) Temp Source: Oral (05/13 1307) BP: 117/57 mmHg (05/13 1307) Pulse Rate: 75 (05/13 1307) Intake/Output from previous day:   Intake/Output from this shift:    Labs:  Recent Labs  02/17/15 1059  WBC 4.0  HGB 6.7*  PLT 259  CREATININE 2.06*   Estimated Creatinine Clearance: 38.9 mL/min (by C-G formula based on Cr of 2.06). No results for input(s): VANCOTROUGH, VANCOPEAK, VANCORANDOM, GENTTROUGH, GENTPEAK, GENTRANDOM, TOBRATROUGH, TOBRAPEAK, TOBRARND, AMIKACINPEAK, AMIKACINTROU, AMIKACIN in the last 72 hours.   Microbiology: No results found for this or any previous visit (from the past 720 hour(s)).  Medical History: Past Medical History  Diagnosis Date  . Obesity, unspecified   . Unspecified vitamin D deficiency   . Pure hypercholesterolemia   . Neuropathy 2011  . Chicken pox   . Coronary artery disease   . CHF (congestive heart failure) 07/19/2012    EF 15% by cardiac catheterization.  Admission.  . Hypertension   . Anemia   . Automatic implantable cardioverter-defibrillator in situ   . Type II diabetes mellitus   . Myocardial infarct 07/19/12    s/p cardiac catheterization with stenting to LAD.  Marland Kitchen History of blood transfusion ~ 2011    "plasma; had neuropathy; couldn't walk"  . OSA on CPAP     Moderate with AHI 23/hr and now on CPAP at 16cm H2O.  Her DME is [facility]    Medications:  Scheduled:  . sodium chloride   Intravenous Once  . carvedilol  25 mg Oral BID WC  . doxycycline  100 mg Oral BID  . ferrous sulfate  325 mg Oral BID WC  . gabapentin  600 mg Oral TID  . glimepiride  4 mg Oral BID AC  . hydrALAZINE  50 mg Oral TID  . insulin aspart  0-9 Units  Subcutaneous TID WC  . isosorbide mononitrate  60 mg Oral Daily  . levofloxacin  500 mg Oral Daily  . linagliptin  5 mg Oral Daily  . nortriptyline  50 mg Oral QHS  . pantoprazole (PROTONIX) IV  40 mg Intravenous Q12H  . pravastatin  40 mg Oral QHS  . torsemide  40 mg Oral Daily  . [START ON 02/18/2015] vancomycin  1,500 mg Intravenous Q24H   Assessment: Pharmacy consulted to continue vancomycin regimen from outpatient. Patient is seen by Dr Ola Spurr as outpatient for Osteomyelitis. Patient has CHF, and unknown baseline renal function. Current dose of vancomycin 1500mg  IV Q24hrs. Unclear when last dose of vancomycin was.   SCr: 2, Est CrCl: 60ml/min, Ke: 0.036, t1/2: 19hrs, Vd: 59L   Goal of Therapy:  Vancomycin trough level 15-20 mcg/ml  Plan:  Current SCr is 2. Will obtain vancomycin trough on 5/13 at 1700hrs to determine current drug concentrations. If level is below 15, will re schedule vancomycin 1500mg  IV Q24hrs to start tonight. If level is greater than 20, may need to hold vancomycin and recheck level based on degree of elevation. Patient is at risk of accumulation due to wt of 118kg. Pharmacy will continue to follow.  Measure antibiotic drug levels at steady state Follow up culture results  Andria Rhein, Pharm D., BCPS 02/17/2015

## 2015-02-17 NOTE — Progress Notes (Signed)
HgA1c results as ordered upon admission and as needed Treatment Activities: Patient referred to Primary Care Physician for further nutritional evaluation  : 02/16/2015 Notes: Orientation to the Wound Care Program Nursing Diagnoses: Knowledge deficit related to the wound healing center program Goals: Patient/caregiver will verbalize understanding of the [facility] Date Initiated: 01/10/2015 Goal Status: Active Interventions: Provide education on orientation to the wound center Notes: Osteomyelitis Nursing Diagnoses: Potential for infection: osteomyelitis Goals: Diagnostic evaluation for osteomyelitis completed as ordered Date Initiated: 01/17/2015 Goal Status: Active Patient/caregiver will verbalize understanding of disease process and disease management Date Initiated: 01/17/2015 Goal Status: Active Signs and symptoms for osteomyelitis will be recognized and promptly addressed [name], [name] ([id number]) Date Initiated: 01/17/2015 Goal Status: Active Interventions: Assess for signs and symptoms of osteomyelitis resolution every visit Provide education on osteomyelitis Treatment Activities: MRI : 02/16/2015 Test ordered outside of clinic : 02/16/2015 X-ray : 02/16/2015 Notes: Soft Tissue Infection Nursing Diagnoses: Knowledge deficit related to disease process and management Potential for infection: soft tissue Goals: Patient/caregiver will verbalize understanding of or measures to prevent infection and contamination in the home setting Date Initiated: 01/17/2015 Goal Status: Active Patient's soft tissue infection will resolve Date Initiated: 01/17/2015 Goal Status: Active Signs and symptoms of infection will be recognized early to allow for prompt treatment Date Initiated: 01/17/2015 Goal Status: Active Interventions: Assess signs and symptoms of infection every visit Provide education on infection Screen for HBO Treatment Activities: Culture : 02/16/2015 Culture and sensitivity : 02/16/2015 Systemic antibiotics : 02/16/2015 Test ordered outside of clinic : 02/16/2015 Notes: Wound/Skin  Impairment [name], [name] ([id number]) Nursing Diagnoses: Impaired tissue integrity Knowledge deficit related to ulceration/compromised skin integrity Goals: Patient/caregiver will verbalize understanding of skin care regimen Date Initiated: 01/10/2015 Goal Status: Active Ulcer/skin breakdown will have a volume reduction of 30% by week 4 Date Initiated: 01/10/2015 Goal Status: Active Ulcer/skin breakdown will have a volume reduction of 50% by week 8 Date Initiated: 01/10/2015 Goal Status: Active Ulcer/skin breakdown will have a volume reduction of 80% by week 12 Date Initiated: 01/10/2015 Goal Status: Active Ulcer/skin breakdown will heal within 14 weeks Date Initiated: 01/10/2015 Goal Status: Active Interventions: Assess patient/caregiver ability to obtain necessary supplies Assess patient/caregiver ability to perform ulcer/skin care regimen upon admission and as needed Assess ulceration(s) every visit Provide education on smoking Provide education on ulcer and skin care Treatment Activities: Consult for HBO : 02/16/2015 Skin care regimen initiated : 02/16/2015 Topical wound management initiated : 02/16/2015 Notes: Electronic Signature(s) Signed: 02/16/2015 5:34:11 PM By: Montey Hora Entered By: Montey Hora on 02/16/2015 15:42:28 [name] ([id number]) -------------------------------------------------------------------------------- Pain Assessment Details Patient Name: [name] Date of Service: 02/16/2015 3:00 PM Medical Record Number: [id number] Patient Account Number: [id number] Date of Birth/Sex: 06/19/1956 (59 y.o. Female) Treating RN: Baruch Gouty, RN, BSN, Velva Harman Primary Care Physician: [name] Other Clinician: Referring Physician: [name] Treating Physician/Extender: Frann Rider in Treatment: 5 Active Problems Location of Pain Severity and Description of Pain Patient Has Paino No Site Locations Pain Management and Medication Current  Pain Management: Electronic Signature(s) Signed: 02/16/2015 1:56:59 PM By: Regan Lemming BSN, RN Entered By: Regan Lemming on 02/16/2015 15:30:55 [name] ([id number]) -------------------------------------------------------------------------------- Patient/Caregiver Education Details Patient Name: [name] Date of Service: 02/16/2015 3:00 PM Medical Record Number: [id number] Patient Account Number: [id number] Date of Birth/Gender: May 23, 1956 (59 y.o. Female) Treating RN: Baruch Gouty, RN, BSN, Velva Harman Primary Care Physician: [name] Other Clinician: Referring Physician: [name] Treating Physician/Extender: Frann Rider in  Area (cm) : [1:2.827] [2:0.008] [3:0.785] Volume (cm) : [1:0.848] [2:0.001] [3:0.079] % Reduction in Area: [1:28.00%] [2:98.40%] [3:-42.70%] % Reduction in Volume: -115.80% [2:99.00%] [3:28.20%] Classification: [1:Grade 2] [2:Grade 1] [3:Category/Stage III] Exudate Amount: [1:Large] [2:Small] [3:None Present] Exudate Type: [1:Serosanguineous] [2:Serosanguineous] [3:N/A] Exudate Color: [1:red, brown] [2:red, brown] [3:N/A] Wound Margin: [1:Distinct, outline attached] [2:Distinct, outline attached] [3:Indistinct, nonvisible] Granulation Amount: [1:Medium (34-66%)] [2:None Present (0%)] [3:None Present (0%)] Granulation Quality: [1:Pink] [2:N/A] [3:N/A] Necrotic Amount: [1:Medium (34-66%)] [2:None Present (0%)] [3:Large (67-100%)] Necrotic Tissue: [1:Eschar, Adherent Slough] [2:N/A] [3:Eschar] Exposed Structures: [1:Fascia: No Fat: No Tendon: No Muscle: No Joint: No Bone: No Limited to Skin Breakdown] [2:Fascia: No Fat: No Tendon: No Muscle:  No Joint: No Bone: No Limited to Skin Breakdown] [3:Fascia: No Fat: No Tendon: No Muscle: No Joint: No Bone: No Limited to  Skin Breakdown] Epithelialization: [1:Small (1-33%)] [2:Small (1-33%)] [3:Small (1-33%)] Periwound Skin Texture: Edema: No [1:Excoriation: No] [2:Edema: Yes Excoriation: No] [3:Edema: No Excoriation: No] Induration: No Induration: No Induration: No Callus: No Callus: No Callus: No Crepitus: No Crepitus: No Crepitus: No Fluctuance: No Fluctuance: No Fluctuance: No Friable: No Friable: No Friable: No Rash: No Rash: No Rash: No Scarring: No Scarring: No Scarring: No Periwound Skin Maceration: Yes Dry/Scaly: Yes Dry/Scaly: Yes Moisture: Moist: Yes Maceration: No Maceration: No Dry/Scaly: No Moist: No Moist: No Periwound Skin Color: Atrophie Blanche: No Atrophie Blanche: No Atrophie Blanche: No Cyanosis: No Cyanosis: No Cyanosis: No Ecchymosis: No Ecchymosis: No Ecchymosis: No Erythema: No Erythema: No Erythema: No Hemosiderin Staining: No Hemosiderin Staining: No Hemosiderin Staining: No Mottled: No Mottled: No Mottled: No Pallor: No Pallor: No Pallor: No Rubor: No Rubor: No Rubor: No Temperature: No Abnormality No Abnormality No Abnormality Tenderness on Yes No No Palpation: Wound Preparation: Ulcer Cleansing: Ulcer Cleansing: Ulcer Cleansing: Rinsed/Irrigated with Rinsed/Irrigated with Rinsed/Irrigated with Saline Saline Saline Topical Anesthetic Topical Anesthetic Topical Anesthetic Applied: Other: lidocaine Applied: Other: lidocaine Applied: Other: Lidocaine 4% 4% 4% Treatment Notes Electronic Signature(s) Signed: 02/16/2015 5:34:11 PM By: Montey Hora Entered By: Montey Hora on 02/16/2015 15:43:27 [name] ([id number]) -------------------------------------------------------------------------------- [place] Details Patient Name: [name] Date of Service: 02/16/2015 3:00  PM Medical Record Number: [id number] Patient Account Number: [id number] Date of Birth/Sex: [birth year] (59 y.o. Female) Treating RN: Montey Hora Primary Care Physician: [name] Other Clinician: Referring Physician: [name] Treating Physician/Extender: Frann Rider in Treatment: 5 Active Inactive Abuse / Safety / Falls / Self Care Management Nursing Diagnoses: Impaired physical mobility Potential for falls Self care deficit: actual or potential Goals: Patient/caregiver will verbalize understanding of skin care regimen Date Initiated: 01/10/2015 Goal Status: Active Patient/caregiver will verbalize/demonstrate measures taken to improve the patient's personal safety Date Initiated: 01/10/2015 Goal Status: Active Patient/caregiver will verbalize/demonstrate measures taken to prevent injury and/or falls Date Initiated: 01/10/2015 Goal Status: Active Patient/caregiver will verbalize/demonstrate understanding of what to do in case of emergency Date Initiated: 01/10/2015 Goal Status: Active Interventions: Assess fall risk on admission and as needed Assess impairment of mobility on admission and as needed per policy Provide education on basic hygiene Provide education on fall prevention Provide education on personal and home safety Provide education on safe transfers Treatment Activities: Education provided on Basic Hygiene : 01/10/2015 Notes: Nutrition SABRINA, MIYAZAKI ([id number]) Nursing Diagnoses: Impaired glucose control: actual or potential Potential for alteratiion in Nutrition/Potential for imbalanced nutrition Goals: Patient/caregiver agrees to and verbalizes understanding of need to use nutritional supplements and/or vitamins as prescribed Date Initiated: 01/10/2015 Goal Status: Active Interventions: Assess  HgA1c results as ordered upon admission and as needed Treatment Activities: Patient referred to Primary Care Physician for further nutritional evaluation  : 02/16/2015 Notes: Orientation to the Wound Care Program Nursing Diagnoses: Knowledge deficit related to the wound healing center program Goals: Patient/caregiver will verbalize understanding of the [facility] Date Initiated: 01/10/2015 Goal Status: Active Interventions: Provide education on orientation to the wound center Notes: Osteomyelitis Nursing Diagnoses: Potential for infection: osteomyelitis Goals: Diagnostic evaluation for osteomyelitis completed as ordered Date Initiated: 01/17/2015 Goal Status: Active Patient/caregiver will verbalize understanding of disease process and disease management Date Initiated: 01/17/2015 Goal Status: Active Signs and symptoms for osteomyelitis will be recognized and promptly addressed [name], [name] ([id number]) Date Initiated: 01/17/2015 Goal Status: Active Interventions: Assess for signs and symptoms of osteomyelitis resolution every visit Provide education on osteomyelitis Treatment Activities: MRI : 02/16/2015 Test ordered outside of clinic : 02/16/2015 X-ray : 02/16/2015 Notes: Soft Tissue Infection Nursing Diagnoses: Knowledge deficit related to disease process and management Potential for infection: soft tissue Goals: Patient/caregiver will verbalize understanding of or measures to prevent infection and contamination in the home setting Date Initiated: 01/17/2015 Goal Status: Active Patient's soft tissue infection will resolve Date Initiated: 01/17/2015 Goal Status: Active Signs and symptoms of infection will be recognized early to allow for prompt treatment Date Initiated: 01/17/2015 Goal Status: Active Interventions: Assess signs and symptoms of infection every visit Provide education on infection Screen for HBO Treatment Activities: Culture : 02/16/2015 Culture and sensitivity : 02/16/2015 Systemic antibiotics : 02/16/2015 Test ordered outside of clinic : 02/16/2015 Notes: Wound/Skin  Impairment [name], [name] ([id number]) Nursing Diagnoses: Impaired tissue integrity Knowledge deficit related to ulceration/compromised skin integrity Goals: Patient/caregiver will verbalize understanding of skin care regimen Date Initiated: 01/10/2015 Goal Status: Active Ulcer/skin breakdown will have a volume reduction of 30% by week 4 Date Initiated: 01/10/2015 Goal Status: Active Ulcer/skin breakdown will have a volume reduction of 50% by week 8 Date Initiated: 01/10/2015 Goal Status: Active Ulcer/skin breakdown will have a volume reduction of 80% by week 12 Date Initiated: 01/10/2015 Goal Status: Active Ulcer/skin breakdown will heal within 14 weeks Date Initiated: 01/10/2015 Goal Status: Active Interventions: Assess patient/caregiver ability to obtain necessary supplies Assess patient/caregiver ability to perform ulcer/skin care regimen upon admission and as needed Assess ulceration(s) every visit Provide education on smoking Provide education on ulcer and skin care Treatment Activities: Consult for HBO : 02/16/2015 Skin care regimen initiated : 02/16/2015 Topical wound management initiated : 02/16/2015 Notes: Electronic Signature(s) Signed: 02/16/2015 5:34:11 PM By: Montey Hora Entered By: Montey Hora on 02/16/2015 15:42:28 [name] ([id number]) -------------------------------------------------------------------------------- Pain Assessment Details Patient Name: [name] Date of Service: 02/16/2015 3:00 PM Medical Record Number: [id number] Patient Account Number: [id number] Date of Birth/Sex: 06/19/1956 (59 y.o. Female) Treating RN: Baruch Gouty, RN, BSN, Velva Harman Primary Care Physician: [name] Other Clinician: Referring Physician: [name] Treating Physician/Extender: Frann Rider in Treatment: 5 Active Problems Location of Pain Severity and Description of Pain Patient Has Paino No Site Locations Pain Management and Medication Current  Pain Management: Electronic Signature(s) Signed: 02/16/2015 1:56:59 PM By: Regan Lemming BSN, RN Entered By: Regan Lemming on 02/16/2015 15:30:55 [name] ([id number]) -------------------------------------------------------------------------------- Patient/Caregiver Education Details Patient Name: [name] Date of Service: 02/16/2015 3:00 PM Medical Record Number: [id number] Patient Account Number: [id number] Date of Birth/Gender: May 23, 1956 (59 y.o. Female) Treating RN: Baruch Gouty, RN, BSN, Velva Harman Primary Care Physician: [name] Other Clinician: Referring Physician: [name] Treating Physician/Extender: Frann Rider in  HgA1c results as ordered upon admission and as needed Treatment Activities: Patient referred to Primary Care Physician for further nutritional evaluation  : 02/16/2015 Notes: Orientation to the Wound Care Program Nursing Diagnoses: Knowledge deficit related to the wound healing center program Goals: Patient/caregiver will verbalize understanding of the [facility] Date Initiated: 01/10/2015 Goal Status: Active Interventions: Provide education on orientation to the wound center Notes: Osteomyelitis Nursing Diagnoses: Potential for infection: osteomyelitis Goals: Diagnostic evaluation for osteomyelitis completed as ordered Date Initiated: 01/17/2015 Goal Status: Active Patient/caregiver will verbalize understanding of disease process and disease management Date Initiated: 01/17/2015 Goal Status: Active Signs and symptoms for osteomyelitis will be recognized and promptly addressed [name], [name] ([id number]) Date Initiated: 01/17/2015 Goal Status: Active Interventions: Assess for signs and symptoms of osteomyelitis resolution every visit Provide education on osteomyelitis Treatment Activities: MRI : 02/16/2015 Test ordered outside of clinic : 02/16/2015 X-ray : 02/16/2015 Notes: Soft Tissue Infection Nursing Diagnoses: Knowledge deficit related to disease process and management Potential for infection: soft tissue Goals: Patient/caregiver will verbalize understanding of or measures to prevent infection and contamination in the home setting Date Initiated: 01/17/2015 Goal Status: Active Patient's soft tissue infection will resolve Date Initiated: 01/17/2015 Goal Status: Active Signs and symptoms of infection will be recognized early to allow for prompt treatment Date Initiated: 01/17/2015 Goal Status: Active Interventions: Assess signs and symptoms of infection every visit Provide education on infection Screen for HBO Treatment Activities: Culture : 02/16/2015 Culture and sensitivity : 02/16/2015 Systemic antibiotics : 02/16/2015 Test ordered outside of clinic : 02/16/2015 Notes: Wound/Skin  Impairment [name], [name] ([id number]) Nursing Diagnoses: Impaired tissue integrity Knowledge deficit related to ulceration/compromised skin integrity Goals: Patient/caregiver will verbalize understanding of skin care regimen Date Initiated: 01/10/2015 Goal Status: Active Ulcer/skin breakdown will have a volume reduction of 30% by week 4 Date Initiated: 01/10/2015 Goal Status: Active Ulcer/skin breakdown will have a volume reduction of 50% by week 8 Date Initiated: 01/10/2015 Goal Status: Active Ulcer/skin breakdown will have a volume reduction of 80% by week 12 Date Initiated: 01/10/2015 Goal Status: Active Ulcer/skin breakdown will heal within 14 weeks Date Initiated: 01/10/2015 Goal Status: Active Interventions: Assess patient/caregiver ability to obtain necessary supplies Assess patient/caregiver ability to perform ulcer/skin care regimen upon admission and as needed Assess ulceration(s) every visit Provide education on smoking Provide education on ulcer and skin care Treatment Activities: Consult for HBO : 02/16/2015 Skin care regimen initiated : 02/16/2015 Topical wound management initiated : 02/16/2015 Notes: Electronic Signature(s) Signed: 02/16/2015 5:34:11 PM By: Montey Hora Entered By: Montey Hora on 02/16/2015 15:42:28 [name] ([id number]) -------------------------------------------------------------------------------- Pain Assessment Details Patient Name: [name] Date of Service: 02/16/2015 3:00 PM Medical Record Number: [id number] Patient Account Number: [id number] Date of Birth/Sex: 06/19/1956 (59 y.o. Female) Treating RN: Baruch Gouty, RN, BSN, Velva Harman Primary Care Physician: [name] Other Clinician: Referring Physician: [name] Treating Physician/Extender: Frann Rider in Treatment: 5 Active Problems Location of Pain Severity and Description of Pain Patient Has Paino No Site Locations Pain Management and Medication Current  Pain Management: Electronic Signature(s) Signed: 02/16/2015 1:56:59 PM By: Regan Lemming BSN, RN Entered By: Regan Lemming on 02/16/2015 15:30:55 [name] ([id number]) -------------------------------------------------------------------------------- Patient/Caregiver Education Details Patient Name: [name] Date of Service: 02/16/2015 3:00 PM Medical Record Number: [id number] Patient Account Number: [id number] Date of Birth/Gender: May 23, 1956 (59 y.o. Female) Treating RN: Baruch Gouty, RN, BSN, Velva Harman Primary Care Physician: [name] Other Clinician: Referring Physician: [name] Treating Physician/Extender: Frann Rider in  Area (cm) : [1:2.827] [2:0.008] [3:0.785] Volume (cm) : [1:0.848] [2:0.001] [3:0.079] % Reduction in Area: [1:28.00%] [2:98.40%] [3:-42.70%] % Reduction in Volume: -115.80% [2:99.00%] [3:28.20%] Classification: [1:Grade 2] [2:Grade 1] [3:Category/Stage III] Exudate Amount: [1:Large] [2:Small] [3:None Present] Exudate Type: [1:Serosanguineous] [2:Serosanguineous] [3:N/A] Exudate Color: [1:red, brown] [2:red, brown] [3:N/A] Wound Margin: [1:Distinct, outline attached] [2:Distinct, outline attached] [3:Indistinct, nonvisible] Granulation Amount: [1:Medium (34-66%)] [2:None Present (0%)] [3:None Present (0%)] Granulation Quality: [1:Pink] [2:N/A] [3:N/A] Necrotic Amount: [1:Medium (34-66%)] [2:None Present (0%)] [3:Large (67-100%)] Necrotic Tissue: [1:Eschar, Adherent Slough] [2:N/A] [3:Eschar] Exposed Structures: [1:Fascia: No Fat: No Tendon: No Muscle: No Joint: No Bone: No Limited to Skin Breakdown] [2:Fascia: No Fat: No Tendon: No Muscle:  No Joint: No Bone: No Limited to Skin Breakdown] [3:Fascia: No Fat: No Tendon: No Muscle: No Joint: No Bone: No Limited to  Skin Breakdown] Epithelialization: [1:Small (1-33%)] [2:Small (1-33%)] [3:Small (1-33%)] Periwound Skin Texture: Edema: No [1:Excoriation: No] [2:Edema: Yes Excoriation: No] [3:Edema: No Excoriation: No] Induration: No Induration: No Induration: No Callus: No Callus: No Callus: No Crepitus: No Crepitus: No Crepitus: No Fluctuance: No Fluctuance: No Fluctuance: No Friable: No Friable: No Friable: No Rash: No Rash: No Rash: No Scarring: No Scarring: No Scarring: No Periwound Skin Maceration: Yes Dry/Scaly: Yes Dry/Scaly: Yes Moisture: Moist: Yes Maceration: No Maceration: No Dry/Scaly: No Moist: No Moist: No Periwound Skin Color: Atrophie Blanche: No Atrophie Blanche: No Atrophie Blanche: No Cyanosis: No Cyanosis: No Cyanosis: No Ecchymosis: No Ecchymosis: No Ecchymosis: No Erythema: No Erythema: No Erythema: No Hemosiderin Staining: No Hemosiderin Staining: No Hemosiderin Staining: No Mottled: No Mottled: No Mottled: No Pallor: No Pallor: No Pallor: No Rubor: No Rubor: No Rubor: No Temperature: No Abnormality No Abnormality No Abnormality Tenderness on Yes No No Palpation: Wound Preparation: Ulcer Cleansing: Ulcer Cleansing: Ulcer Cleansing: Rinsed/Irrigated with Rinsed/Irrigated with Rinsed/Irrigated with Saline Saline Saline Topical Anesthetic Topical Anesthetic Topical Anesthetic Applied: Other: lidocaine Applied: Other: lidocaine Applied: Other: Lidocaine 4% 4% 4% Treatment Notes Electronic Signature(s) Signed: 02/16/2015 5:34:11 PM By: Montey Hora Entered By: Montey Hora on 02/16/2015 15:43:27 [name] ([id number]) -------------------------------------------------------------------------------- [place] Details Patient Name: [name] Date of Service: 02/16/2015 3:00  PM Medical Record Number: [id number] Patient Account Number: [id number] Date of Birth/Sex: [birth year] (59 y.o. Female) Treating RN: Montey Hora Primary Care Physician: [name] Other Clinician: Referring Physician: [name] Treating Physician/Extender: Frann Rider in Treatment: 5 Active Inactive Abuse / Safety / Falls / Self Care Management Nursing Diagnoses: Impaired physical mobility Potential for falls Self care deficit: actual or potential Goals: Patient/caregiver will verbalize understanding of skin care regimen Date Initiated: 01/10/2015 Goal Status: Active Patient/caregiver will verbalize/demonstrate measures taken to improve the patient's personal safety Date Initiated: 01/10/2015 Goal Status: Active Patient/caregiver will verbalize/demonstrate measures taken to prevent injury and/or falls Date Initiated: 01/10/2015 Goal Status: Active Patient/caregiver will verbalize/demonstrate understanding of what to do in case of emergency Date Initiated: 01/10/2015 Goal Status: Active Interventions: Assess fall risk on admission and as needed Assess impairment of mobility on admission and as needed per policy Provide education on basic hygiene Provide education on fall prevention Provide education on personal and home safety Provide education on safe transfers Treatment Activities: Education provided on Basic Hygiene : 01/10/2015 Notes: Nutrition SABRINA, MIYAZAKI ([id number]) Nursing Diagnoses: Impaired glucose control: actual or potential Potential for alteratiion in Nutrition/Potential for imbalanced nutrition Goals: Patient/caregiver agrees to and verbalizes understanding of need to use nutritional supplements and/or vitamins as prescribed Date Initiated: 01/10/2015 Goal Status: Active Interventions: Assess  Area (cm) : [1:2.827] [2:0.008] [3:0.785] Volume (cm) : [1:0.848] [2:0.001] [3:0.079] % Reduction in Area: [1:28.00%] [2:98.40%] [3:-42.70%] % Reduction in Volume: -115.80% [2:99.00%] [3:28.20%] Classification: [1:Grade 2] [2:Grade 1] [3:Category/Stage III] Exudate Amount: [1:Large] [2:Small] [3:None Present] Exudate Type: [1:Serosanguineous] [2:Serosanguineous] [3:N/A] Exudate Color: [1:red, brown] [2:red, brown] [3:N/A] Wound Margin: [1:Distinct, outline attached] [2:Distinct, outline attached] [3:Indistinct, nonvisible] Granulation Amount: [1:Medium (34-66%)] [2:None Present (0%)] [3:None Present (0%)] Granulation Quality: [1:Pink] [2:N/A] [3:N/A] Necrotic Amount: [1:Medium (34-66%)] [2:None Present (0%)] [3:Large (67-100%)] Necrotic Tissue: [1:Eschar, Adherent Slough] [2:N/A] [3:Eschar] Exposed Structures: [1:Fascia: No Fat: No Tendon: No Muscle: No Joint: No Bone: No Limited to Skin Breakdown] [2:Fascia: No Fat: No Tendon: No Muscle:  No Joint: No Bone: No Limited to Skin Breakdown] [3:Fascia: No Fat: No Tendon: No Muscle: No Joint: No Bone: No Limited to  Skin Breakdown] Epithelialization: [1:Small (1-33%)] [2:Small (1-33%)] [3:Small (1-33%)] Periwound Skin Texture: Edema: No [1:Excoriation: No] [2:Edema: Yes Excoriation: No] [3:Edema: No Excoriation: No] Induration: No Induration: No Induration: No Callus: No Callus: No Callus: No Crepitus: No Crepitus: No Crepitus: No Fluctuance: No Fluctuance: No Fluctuance: No Friable: No Friable: No Friable: No Rash: No Rash: No Rash: No Scarring: No Scarring: No Scarring: No Periwound Skin Maceration: Yes Dry/Scaly: Yes Dry/Scaly: Yes Moisture: Moist: Yes Maceration: No Maceration: No Dry/Scaly: No Moist: No Moist: No Periwound Skin Color: Atrophie Blanche: No Atrophie Blanche: No Atrophie Blanche: No Cyanosis: No Cyanosis: No Cyanosis: No Ecchymosis: No Ecchymosis: No Ecchymosis: No Erythema: No Erythema: No Erythema: No Hemosiderin Staining: No Hemosiderin Staining: No Hemosiderin Staining: No Mottled: No Mottled: No Mottled: No Pallor: No Pallor: No Pallor: No Rubor: No Rubor: No Rubor: No Temperature: No Abnormality No Abnormality No Abnormality Tenderness on Yes No No Palpation: Wound Preparation: Ulcer Cleansing: Ulcer Cleansing: Ulcer Cleansing: Rinsed/Irrigated with Rinsed/Irrigated with Rinsed/Irrigated with Saline Saline Saline Topical Anesthetic Topical Anesthetic Topical Anesthetic Applied: Other: lidocaine Applied: Other: lidocaine Applied: Other: Lidocaine 4% 4% 4% Treatment Notes Electronic Signature(s) Signed: 02/16/2015 5:34:11 PM By: Montey Hora Entered By: Montey Hora on 02/16/2015 15:43:27 [name] ([id number]) -------------------------------------------------------------------------------- [place] Details Patient Name: [name] Date of Service: 02/16/2015 3:00  PM Medical Record Number: [id number] Patient Account Number: [id number] Date of Birth/Sex: [birth year] (59 y.o. Female) Treating RN: Montey Hora Primary Care Physician: [name] Other Clinician: Referring Physician: [name] Treating Physician/Extender: Frann Rider in Treatment: 5 Active Inactive Abuse / Safety / Falls / Self Care Management Nursing Diagnoses: Impaired physical mobility Potential for falls Self care deficit: actual or potential Goals: Patient/caregiver will verbalize understanding of skin care regimen Date Initiated: 01/10/2015 Goal Status: Active Patient/caregiver will verbalize/demonstrate measures taken to improve the patient's personal safety Date Initiated: 01/10/2015 Goal Status: Active Patient/caregiver will verbalize/demonstrate measures taken to prevent injury and/or falls Date Initiated: 01/10/2015 Goal Status: Active Patient/caregiver will verbalize/demonstrate understanding of what to do in case of emergency Date Initiated: 01/10/2015 Goal Status: Active Interventions: Assess fall risk on admission and as needed Assess impairment of mobility on admission and as needed per policy Provide education on basic hygiene Provide education on fall prevention Provide education on personal and home safety Provide education on safe transfers Treatment Activities: Education provided on Basic Hygiene : 01/10/2015 Notes: Nutrition SABRINA, MIYAZAKI ([id number]) Nursing Diagnoses: Impaired glucose control: actual or potential Potential for alteratiion in Nutrition/Potential for imbalanced nutrition Goals: Patient/caregiver agrees to and verbalizes understanding of need to use nutritional supplements and/or vitamins as prescribed Date Initiated: 01/10/2015 Goal Status: Active Interventions: Assess

## 2015-02-17 NOTE — ED Provider Notes (Signed)
[facility] Emergency Department Provider Note  ____________________________________________  Time seen: Approximately 11:55 AM  I have reviewed the triage vital signs and the nursing notes.   HISTORY  Chief Complaint Fatigue    HPI TAMAIYA WILMES is a 59 y.o. female who notes feeling generally weak and slightly short of breath with walking over the last few days. She states that she was referred here because of a low blood count.  She denies any fevers or chills. She states her foot is healing well. Patient denies any other symptoms or concerns at this time. She denies specifically any chest pain, nausea, vomiting, fevers.  There is no pain. Mild dyspnea with exertion for the last week. No blood in her stool. Does take aspirin. As a history of bleeding previously.  Past Medical History  Diagnosis Date  . Obesity, unspecified   . Unspecified vitamin D deficiency   . Pure hypercholesterolemia   . Neuropathy 2011  . Chicken pox   . Coronary artery disease   . CHF (congestive heart failure) 07/19/2012    EF 15% by cardiac catheterization.  Admission.  . Hypertension   . Anemia   . Automatic implantable cardioverter-defibrillator in situ   . Type II diabetes mellitus   . Myocardial infarct 07/19/12    s/p cardiac catheterization with stenting to LAD.  Marland Kitchen History of blood transfusion ~ 2011    "plasma; had neuropathy; couldn't walk"  . OSA on CPAP     Moderate with AHI 23/hr and now on CPAP at 16cm H2O.  Her DME is [facility]    Patient Active Problem List   Diagnosis Date Noted  . Polyneuropathy 01/07/2015  . OSA (obstructive sleep apnea) 08/15/2014  . Chronic left ventricular systolic dysfunction 0000000  . Morbid obesity 10/26/2013  . Routine general medical examination at a health care facility 04/29/2013  . Diarrhea 04/29/2013  . Essential hypertension, benign 04/29/2013  . Renal insufficiency 04/19/2013  . Type II or unspecified type  diabetes mellitus without mention of complication, not stated as uncontrolled 12/07/2012  . Pure hypercholesterolemia 12/07/2012  . Iron deficiency anemia 12/07/2012  . Diabetic peripheral neuropathy associated with type 2 diabetes mellitus 12/07/2012  . Chronic systolic heart failure [id number]  . Left bundle branch block 07/25/2012  . CAD (coronary artery disease) 07/25/2012  . Acute CHF (congestive heart failure) 07/21/2012  . Diabetes mellitus type 2 in obese 07/21/2012  . Hypoxia 07/21/2012  . HLD (hyperlipidemia) 07/21/2012  . Acute systolic heart failure [id number]  . Acute myocardial infarction, subendocardial infarction, initial episode of care 07/21/2012    Past Surgical History  Procedure Laterality Date  . Lumbar disc surgery  1996    L4-5  . Incision and drainage abscess Right 2007    groin; with ICU stay due to sepsis.  . Bilateral oophorectomy  01/2011    ovarian cyst benign  . Bi-ventricular implantable cardioverter defibrillator  (crt-d)  07/06/2014  . Laparoscopic cholecystectomy  2011  . Back surgery    . Coronary angioplasty with stent placement Left 07/2012    new onset systolic CHF; elevated troponins.  Cardiac catheterization with stenting to LAD; EF 15%.  2D-echo: EF 20-25%.  . Vaginal hysterectomy  01/2011    Fibroids/DUB.  Ovaries removed. [place].  . Tubal ligation  1981  . Left heart catheterization with coronary angiogram N/A 07/21/2012    Procedure: LEFT HEART CATHETERIZATION WITH CORONARY ANGIOGRAM;  Surgeon: Jolaine Artist, MD;  Location: [facility] CATH LAB;  Service:  Cardiovascular;  Laterality: N/A;  . Percutaneous coronary stent intervention (pci-s) N/A 07/23/2012    Procedure: PERCUTANEOUS CORONARY STENT INTERVENTION (PCI-S);  Surgeon: Sherren Mocha, MD;  Location: [facility] CATH LAB;  Service: Cardiovascular;  Laterality: N/A;  . Bi-ventricular implantable cardioverter defibrillator N/A 07/06/2014    Procedure: BI-VENTRICULAR IMPLANTABLE CARDIOVERTER  DEFIBRILLATOR  (CRT-D);  Surgeon: Deboraha Sprang, MD;  Location: [facility] CATH LAB;  Service: Cardiovascular;  Laterality: N/A;    Current Outpatient Rx  Name  Route  Sig  Dispense  Refill  . aspirin EC 81 MG tablet   Oral   Take 1 tablet (81 mg total) by mouth daily.   90 tablet   3   . carvedilol (COREG) 25 MG tablet   Oral   Take 1 tablet (25 mg total) by mouth 2 (two) times daily with a meal.   180 tablet   3   . clopidogrel (PLAVIX) 75 MG tablet   Oral   Take 1 tablet (75 mg total) by mouth daily.   90 tablet   3   . ferrous sulfate 325 (65 FE) MG tablet   Oral   Take 325 mg by mouth 2 (two) times daily with a meal.         . gabapentin (NEURONTIN) 600 MG tablet   Oral   Take 1 tablet (600 mg total) by mouth 3 (three) times daily.   270 tablet   3   . glimepiride (AMARYL) 4 MG tablet   Oral   Take 1 tablet (4 mg total) by mouth 2 (two) times daily before a meal.   180 tablet   3   . hydrALAZINE (APRESOLINE) 50 MG tablet   Oral   Take 1 tablet (50 mg total) by mouth 3 (three) times daily.   270 tablet   3   . isosorbide mononitrate (IMDUR) 60 MG 24 hr tablet   Oral   Take 1 tablet (60 mg total) by mouth daily.   90 tablet   3   . metFORMIN (GLUCOPHAGE) 1000 MG tablet   Oral   Take 1 tablet (1,000 mg total) by mouth daily.   90 tablet   0   . nitroGLYCERIN (NITROSTAT) 0.4 MG SL tablet   Sublingual   Place 1 tablet (0.4 mg total) under the tongue every 5 (five) minutes as needed for chest pain.   30 tablet   0   . nortriptyline (PAMELOR) 50 MG capsule   Oral   Take 1 capsule (50 mg total) by mouth at bedtime.   90 capsule   3   . pravastatin (PRAVACHOL) 40 MG tablet   Oral   Take 1 tablet (40 mg total) by mouth at bedtime.   90 tablet   3   . sitaGLIPtin (JANUVIA) 100 MG tablet   Oral   Take 1 tablet (100 mg total) by mouth daily.   90 tablet   0   . torsemide (DEMADEX) 20 MG tablet   Oral   Take 2 tablets (40 mg total) by mouth  daily.   60 tablet   3     Please cancel all previous orders for current medi ...   . traMADol (ULTRAM) 50 MG tablet   Oral   Take 1 tablet (50 mg total) by mouth every 6 (six) hours as needed. For pain   180 tablet   5   . vancomycin in sodium chloride 0.9 % 500 mL   Intravenous   Inject into  the vein daily.         Marland Kitchen doxycycline (VIBRAMYCIN) 100 MG capsule   Oral   Take 1 capsule (100 mg total) by mouth 2 (two) times daily. Patient not taking: Reported on 02/17/2015   20 capsule   0   . fluticasone (FLONASE) 50 MCG/ACT nasal spray      1 spray each nostril twice a day Patient not taking: Reported on 02/17/2015   16 g   6   . glucose blood test strip      Test blood sugar daily. Dx code: 250.00   100 each   4     supplement with brand of patient's meter as needed ...   . levofloxacin (LEVAQUIN) 500 MG tablet   Oral   Take 1 tablet (500 mg total) by mouth daily. Patient not taking: Reported on 02/17/2015   10 tablet   0     Allergies Review of patient's allergies indicates no known allergies.  Family History  Problem Relation Age of Onset  . Diabetes Mother   . Hypertension Mother   . Arthritis Mother     knees, lumbar DDD, cervical DDD  . Cancer Father     prostate,skin,lymphoma.  . Obesity Brother   . Diabetes Maternal Grandmother   . Diabetes Paternal Grandmother     Social History History  Substance Use Topics  . Smoking status: Never Smoker   . Smokeless tobacco: Never Used  . Alcohol Use: No    Review of Systems Constitutional: No fever/chills Eyes: No visual changes. ENT: No sore throat. Cardiovascular: See history of present illness Respiratory: Denies shortness of breath. Gastrointestinal: No abdominal pain.  No nausea, no vomiting.  No diarrhea.  No constipation. Genitourinary: Negative for dysuria. Musculoskeletal: Negative for back pain. Skin: Negative for rash. Neurological: Negative for headaches, focal weakness or  numbness.  10-point ROS otherwise negative.  ____________________________________________   PHYSICAL EXAM:  VITAL SIGNS: ED Triage Vitals  Enc Vitals Group     BP 02/17/15 1032 118/53 mmHg     Pulse Rate 02/17/15 1032 72     Resp 02/17/15 1032 17     Temp 02/17/15 1032 97.6 F (36.4 C)     Temp Source 02/17/15 1032 Oral     SpO2 02/17/15 1032 96 %     Weight 02/17/15 1032 368 lb (166.924 kg)     Height 02/17/15 1032 5\' 8"  (1.727 m)     Head Cir --      Peak Flow --      Pain Score --      Pain Loc --      Pain Edu? --      Excl. in [place]? --     Constitutional: Alert and oriented. Well appearing and in no acute distress. Eyes: Conjunctivae are normal. PERRL. EOMI. Head: Atraumatic. Nose: No congestion/rhinnorhea. Mouth/Throat: Mucous membranes are moist.  Oropharynx non-erythematous. Neck: No stridor.   Cardiovascular: Normal rate, regular rhythm. Grossly normal heart sounds.  Good peripheral circulation. There is a faint systolic ejection murmur Respiratory: Normal respiratory effort.  No retractions. Lungs CTAB. Gastrointestinal: Soft and nontender. No distention. No abdominal bruits. No CVA tenderness. 1Musculoskeletal: No lower extremity tenderness nor edema.  No joint effusions. Neurologic:  Normal speech and language. No gross focal neurologic deficits are appreciated. Speech is normal. No gait instability. Skin:  Skin is warm, dry and intact. No rash noted. Psychiatric: Mood and affect are normal. Speech and behavior are normal.  ____________________________________________  LABS (all labs ordered are listed, but only abnormal results are displayed)  Labs Reviewed  CBC - Abnormal; Notable for the following:    RBC 3.05 (*)    Hemoglobin 6.7 (*)    HCT 21.5 (*)    MCV 70.6 (*)    MCH 22.1 (*)    MCHC 31.2 (*)    RDW 17.7 (*)    All other components within normal limits  BASIC METABOLIC PANEL - Abnormal; Notable for the following:    Sodium 131 (*)     Chloride 96 (*)    Glucose, Bld 134 (*)    BUN 49 (*)    Creatinine, Ser 2.06 (*)    Calcium 8.0 (*)    GFR calc non Af Amer 25 (*)    GFR calc Af Amer 29 (*)    All other components within normal limits  TYPE AND SCREEN  ABO/RH   ____________________________________________  EKG  Ventricular paced. Otherwise unremarkable EKG. ____________________________________________  RADIOLOGY   ____________________________________________   PROCEDURES  Procedure(s) performed: None  Critical Care performed: No  ____________________________________________   INITIAL IMPRESSION / ASSESSMENT AND PLAN / ED COURSE  Pertinent labs & imaging results that were available during my care of the patient were reviewed by me and considered in my medical decision making (see chart for details).  Patient denies any symptoms except for some mild exertional dyspnea and fatigue. She denies bleeding. No blood in her stools. Her presentation is likely consistent with anemia.  ----------------------------------------- 11:59 AM on 02/17/2015 -----------------------------------------  Patient's hemoglobin returned at 6.7. Discussed with the patient who is amenable to [place] for transfusion based on my bedside assessment, however the hospitalist will order a blood transfusion and complete consent via nursing staff. Presently are Hospital policy he is not to perform nonemergent blood transfusions the emergency department, so we will defer to the hospitalist service to arrange.   ____________________________________________   FINAL CLINICAL IMPRESSION(S) / ED DIAGNOSES  Final diagnoses:  None   anemia, initial, acute requiring blood transfusion    Delman Kitten, MD 02/17/15 1200

## 2015-02-17 NOTE — ED Notes (Addendum)
Patient had blood work drawn Wednesday. Contacted by PCP and informed her hgb is 6. Patient noted to be pale in triage. No other obvious distress. Patient reports fatigue. Patient currently receives IV antibiotics through a PICC line for a left heel wound

## 2015-02-17 NOTE — ED Notes (Signed)
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

## 2015-02-17 NOTE — Progress Notes (Addendum)
[facility]  Patient Status: Active (receiving services up to time of hospitalization)  [facility] is providing the following services: RN and IV Vancomycin   If patient discharges after hours, please call [phone].   [name] 02/17/2015, 1:26 PM

## 2015-02-17 NOTE — H&P (Signed)
[facility] - [place] at [place] [facility]   PATIENT NAME: [name]    MR#:  [id number]  DATE OF BIRTH:  March 28, 1956  DATE OF ADMISSION:  02/17/2015  PRIMARY CARE PHYSICIAN: Nilda Simmer, MD   REQUESTING/REFERRING PHYSICIAN: Dr. Fanny Bien  CHIEF COMPLAINT:  Low hemoglobin and fatigue HISTORY OF PRESENT ILLNESS:  [name]  is a 59 y.o. female with a known history of osteomyelitis of the left heel currently receiving IV antibiotics, iron deficiency anemia, diabetes, congestive heart failure and essential hypertension who presents with above complaint. Home care health nurse to some labs and her hemoglobin was noted to be low so she was asked to come to the ER for further evaluation. Patient's main symptom is that she has fatigue for the past month. She denies any chest pain, shortness of breath, hematochezia. She does say that she has had dark-colored stools however she says she is on iron pills. She was scheduled for colonoscopy due to her anemia however she is receiving hyperbaric oxygen for her left heel osteomyelitis so she is unable to obtain a colonoscopy for the next 6 weeks. The colonoscopy apparently was scheduled by her primary care physician.  PAST MEDICAL HISTORY:   Past Medical History  Diagnosis Date  . Obesity, unspecified   . Unspecified vitamin D deficiency   . Pure hypercholesterolemia   . Neuropathy 2011  . Chicken pox   . Coronary artery disease   . CHF (congestive heart failure) 07/19/2012    EF 15% by cardiac catheterization.  Admission.  . Hypertension   . Anemia   . Automatic implantable cardioverter-defibrillator in situ   . Type II diabetes mellitus   . Myocardial infarct 07/19/12    s/p cardiac catheterization with stenting to LAD.  Marland Kitchen History of blood transfusion ~ 2011    "plasma; had neuropathy; couldn't walk"  . OSA on CPAP     Moderate with AHI 23/hr and now on CPAP at 16cm H2O.  Her DME is [facility]    PAST SURGICAL HISTOIRY:    Past Surgical History  Procedure Laterality Date  . Lumbar disc surgery  1996    L4-5  . Incision and drainage abscess Right 2007    groin; with ICU stay due to sepsis.  . Bilateral oophorectomy  01/2011    ovarian cyst benign  . Bi-ventricular implantable cardioverter defibrillator  (crt-d)  07/06/2014  . Laparoscopic cholecystectomy  2011  . Back surgery    . Coronary angioplasty with stent placement Left 07/2012    new onset systolic CHF; elevated troponins.  Cardiac catheterization with stenting to LAD; EF 15%.  2D-echo: EF 20-25%.  . Vaginal hysterectomy  01/2011    Fibroids/DUB.  Ovaries removed. [place].  . Tubal ligation  1981  . Left heart catheterization with coronary angiogram N/A 07/21/2012    Procedure: LEFT HEART CATHETERIZATION WITH CORONARY ANGIOGRAM;  Surgeon: Dolores Patty, MD;  Location: [facility] CATH LAB;  Service: Cardiovascular;  Laterality: N/A;  . Percutaneous coronary stent intervention (pci-s) N/A 07/23/2012    Procedure: PERCUTANEOUS CORONARY STENT INTERVENTION (PCI-S);  Surgeon: Tonny Bollman, MD;  Location: [facility] CATH LAB;  Service: Cardiovascular;  Laterality: N/A;  . Bi-ventricular implantable cardioverter defibrillator N/A 07/06/2014    Procedure: BI-VENTRICULAR IMPLANTABLE CARDIOVERTER DEFIBRILLATOR  (CRT-D);  Surgeon: Duke Salvia, MD;  Location: [facility] CATH LAB;  Service: Cardiovascular;  Laterality: N/A;    SOCIAL HISTORY:   History  Substance Use Topics  . Smoking status: Never Smoker   .  Smokeless tobacco: Never Used  . Alcohol Use: No    FAMILY HISTORY:   Family History  Problem Relation Age of Onset  . Diabetes Mother   . Hypertension Mother   . Arthritis Mother     knees, lumbar DDD, cervical DDD  . Cancer Father     prostate,skin,lymphoma.  . Obesity Brother   . Diabetes Maternal Grandmother   . Diabetes Paternal Grandmother     DRUG ALLERGIES:  No Known Allergies  REVIEW OF SYSTEMS:  CONSTITUTIONAL: No fever, +fatigue NO  weakness.  EYES: No blurred or double vision.  EARS, NOSE, AND THROAT: No tinnitus or ear pain.  RESPIRATORY: No cough, shortness of breath, wheezing or hemoptysis.  CARDIOVASCULAR: No chest pain, orthopnea, edema.  GASTROINTESTINAL: No nausea, vomiting, diarrhea or abdominal pain.  GENITOURINARY: No dysuria, hematuria.  ENDOCRINE: No polyuria, nocturia,  HEMATOLOGY: No anemia, easy bruising or bleeding SKIN: Positive osteomyelitis of the left heel MUSCULOSKELETAL: No joint pain or arthritis.   NEUROLOGIC: No tingling, numbness, weakness.  PSYCHIATRY: No anxiety or depression.   MEDICATIONS AT HOME:   Prior to Admission medications   Medication Sig Start Date End Date Taking? Authorizing Provider  aspirin EC 81 MG tablet Take 1 tablet (81 mg total) by mouth daily. 03/31/14  Yes Ethelda Chick, MD  carvedilol (COREG) 25 MG tablet Take 1 tablet (25 mg total) by mouth 2 (two) times daily with a meal. 12/12/14  Yes Dolores Patty, MD  clopidogrel (PLAVIX) 75 MG tablet Take 1 tablet (75 mg total) by mouth daily. 03/31/14  Yes Ethelda Chick, MD  ferrous sulfate 325 (65 FE) MG tablet Take 325 mg by mouth 2 (two) times daily with a meal.   Yes Historical Provider, MD  gabapentin (NEURONTIN) 600 MG tablet Take 1 tablet (600 mg total) by mouth 3 (three) times daily. 03/31/14  Yes Ethelda Chick, MD  glimepiride (AMARYL) 4 MG tablet Take 1 tablet (4 mg total) by mouth 2 (two) times daily before a meal. 03/31/14  Yes Ethelda Chick, MD  hydrALAZINE (APRESOLINE) 50 MG tablet Take 1 tablet (50 mg total) by mouth 3 (three) times daily. 03/31/14  Yes Ethelda Chick, MD  isosorbide mononitrate (IMDUR) 60 MG 24 hr tablet Take 1 tablet (60 mg total) by mouth daily. 12/12/14  Yes Dolores Patty, MD  metFORMIN (GLUCOPHAGE) 1000 MG tablet Take 1 tablet (1,000 mg total) by mouth daily. 01/10/15  Yes Tonye Pearson, MD  nitroGLYCERIN (NITROSTAT) 0.4 MG SL tablet Place 1 tablet (0.4 mg total) under the tongue  every 5 (five) minutes as needed for chest pain. 07/26/12  Yes Tommie Sams, DO  nortriptyline (PAMELOR) 50 MG capsule Take 1 capsule (50 mg total) by mouth at bedtime. 03/31/14  Yes Ethelda Chick, MD  pravastatin (PRAVACHOL) 40 MG tablet Take 1 tablet (40 mg total) by mouth at bedtime. 03/31/14  Yes Ethelda Chick, MD  sitaGLIPtin (JANUVIA) 100 MG tablet Take 1 tablet (100 mg total) by mouth daily. 01/10/15  Yes Tonye Pearson, MD  torsemide (DEMADEX) 20 MG tablet Take 2 tablets (40 mg total) by mouth daily. 12/19/14  Yes Dolores Patty, MD  traMADol (ULTRAM) 50 MG tablet Take 1 tablet (50 mg total) by mouth every 6 (six) hours as needed. For pain 01/16/15  Yes Ethelda Chick, MD  vancomycin in sodium chloride 0.9 % 500 mL Inject into the vein daily.   Yes Historical Provider, MD  doxycycline (  VIBRAMYCIN) 100 MG capsule Take 1 capsule (100 mg total) by mouth 2 (two) times daily. Patient not taking: Reported on 02/17/2015 01/16/15   Ethelda Chick, MD  fluticasone [place]) 50 MCG/ACT nasal spray 1 spray each nostril twice a day Patient not taking: Reported on 02/17/2015 12/30/14   Tonye Pearson, MD  glucose blood test strip Test blood sugar daily. Dx code: 250.00 03/17/14   Shade Flood, MD  levofloxacin (LEVAQUIN) 500 MG tablet Take 1 tablet (500 mg total) by mouth daily. Patient not taking: Reported on 02/17/2015 01/16/15   Ethelda Chick, MD      VITAL SIGNS:  Blood pressure 110/59, pulse 73, temperature 97.6 F (36.4 C), temperature source Oral, resp. rate 21, height 5\' 8"  (1.727 m), weight 166.924 kg (368 lb), SpO2 93 %.  PHYSICAL EXAMINATION:  GENERAL:  59 y.o.-year-old patient lying in the bed with no acute distress.  EYES: Pupils equal, round, reactive to light and accommodation. No scleral icterus. Extraocular muscles intact.  HEENT: Head atraumatic, normocephalic. Oropharynx and nasopharynx clear.  NECK:  Supple, no jugular venous distention. No thyroid enlargement, no  tenderness.  LUNGS: Normal breath sounds bilaterally, no wheezing, rales,rhonchi or crepitation. No use of accessory muscles of respiration.  CARDIOVASCULAR: S1, S2 normal.2/6 SEM murmur, NO rubs or gallops.  ABDOMEN: Soft, nontender, nondistended. Bowel sounds present. No organomegaly or mass.  EXTREMITIES: 3+ edema NOcyanosis, or clubbing. PICC line in the right arm NEUROLOGIC: Cranial nerves II through XII are intact. Muscle strength 5/5 in all extremities. Sensation intact. Gait not checked.  PSYCHIATRIC: The patient is alert and oriented x 3.  SKIN: her left heel is covered  LABORATORY PANEL:   CBC  Recent Labs Lab 02/17/15 1059  WBC 4.0  HGB 6.7*  HCT 21.5*  PLT 259   ------------------------------------------------------------------------------------------------------------------  Chemistries   Recent Labs Lab 02/17/15 1059  NA 131*  K 3.8  CL 96*  CO2 26  GLUCOSE 134*  BUN 49*  CREATININE 2.06*  CALCIUM 8.0*   ------------------------------------------------------------------------------------------------------------------  Cardiac Enzymes No results for input(s): TROPONINI in the last 168 hours. ------------------------------------------------------------------------------------------------------------------  RADIOLOGY:  No results found.  EKG:   Orders placed or performed in visit on 10/25/14  . EKG 12-Lead   PACED rhythm IMPRESSION AND PLAN:  This is a 59 year old female with a past medical history significant for ostium mellitus of the left heel, diabetes, coronary artery disease with AICD/pacemaker placed who presented with a low hemoglobin found by her advanced healthcare nurse.   1. Acute blood loss anemia on chronic: Patient says that she has dark-colored stools however she is on iron pills. She was supposed to have a colonoscopy however due to the fact the patient's on hyperbaric oxygen Monday through Friday she is unable to obtain a  colonoscopy. I have asked the emergency room department physician to guaiac patient's stool. She will need an GI consultation. I will hold Plavix and ASA. I have placed the patient on Protonix and we will obtain serial hemoglobin levels. I will also order iron studies The emergency room physician as consented the patient for blood transfusion. She will receive 1 unit of PRBCs. Patient will be nothing by mouth after midnight for possible EGD and/or colonoscopy in a.m. Currently she will be on a clear liquid ADA diet. 2. Hyponatremia: Patient has mild hyponatremia possibly related to torsemide. I will need to continue to monitor her BMP.  3. Left heel osteomyelitis: Patient is currently seen by Dr. Sampson Goon. She is  on several antibiotics which we will continue. These antibiotics include Levaquin, vancomycin and doxycycline. Pharmacy will help as does vancomycin.  4. Diabetes: Patient will be placed on ADA diet, sliding scale and sullen. I will hold metformin. And we will continue her other medications.  5. CAD with coronary stent: We will continue her outpatient medications. Due to problem #1 I am holding aspirin and Plavix. Further recommendations on these medications after GI consultation.  6. Essential HTN: we can continue her outpatient medications, including Coreg, hydralazine and Imdur.  7. Chronic systolic heart failure: Patient does not appear to be in exacerbation at this time. All the records are reviewed and case discussed with ED provider. Management plans discussed with the patient and she is in agreement.  CODE STATUS: DNR  TOTAL TIME TAKING CARE OF THIS PATIENT: 45 minutes.    Edwing Figley M.D on 02/17/2015 at 12:43 PM  Between 7am to 6pm - Pager - [phone] After 6pm go to [email or web address] - password [email or web address] [facility]  [place] [facility]  Office  [phone]  CC: Primary care physician; Nilda Simmer, MD

## 2015-02-18 ENCOUNTER — Inpatient Hospital Stay: Payer: PPO

## 2015-02-18 LAB — BASIC METABOLIC PANEL
Anion gap: 8 (ref 5–15)
BUN: 44 mg/dL — ABNORMAL HIGH (ref 6–20)
CHLORIDE: 99 mmol/L — AB (ref 101–111)
CO2: 26 mmol/L (ref 22–32)
Calcium: 7.9 mg/dL — ABNORMAL LOW (ref 8.9–10.3)
Creatinine, Ser: 1.8 mg/dL — ABNORMAL HIGH (ref 0.44–1.00)
GFR calc Af Amer: 34 mL/min — ABNORMAL LOW (ref >60)
GFR calc non Af Amer: 29 mL/min — ABNORMAL LOW (ref >60)
Glucose, Bld: 134 mg/dL — ABNORMAL HIGH (ref 65–99)
POTASSIUM: 3.7 mmol/L (ref 3.5–5.1)
Sodium: 133 mmol/L — ABNORMAL LOW (ref 135–145)

## 2015-02-18 LAB — GLUCOSE, CAPILLARY
GLUCOSE-CAPILLARY: 195 mg/dL — AB (ref 65–99)
GLUCOSE-CAPILLARY: 186 mg/dL — AB (ref 65–99)
Glucose-Capillary: 120 mg/dL — ABNORMAL HIGH (ref 65–99)
Glucose-Capillary: 99 mg/dL (ref 65–99)

## 2015-02-18 LAB — CBC
HCT: 23.5 % — ABNORMAL LOW (ref 35.0–47.0)
HEMOGLOBIN: 7.4 g/dL — AB (ref 12.0–16.0)
MCH: 22.5 pg — AB (ref 26.0–34.0)
MCHC: 31.6 g/dL — ABNORMAL LOW (ref 32.0–36.0)
MCV: 71.1 fL — ABNORMAL LOW (ref 80.0–100.0)
PLATELETS: 255 10*3/uL (ref 150–440)
RBC: 3.31 MIL/uL — AB (ref 3.80–5.20)
RDW: 19.2 % — ABNORMAL HIGH (ref 11.5–14.5)
WBC: 3.6 10*3/uL (ref 3.6–11.0)

## 2015-02-18 LAB — PREPARE RBC (CROSSMATCH)

## 2015-02-18 LAB — VANCOMYCIN, RANDOM: VANCOMYCIN RM: 23 ug/mL

## 2015-02-18 MED ORDER — LEVOFLOXACIN 250 MG PO TABS
250.0000 mg | ORAL_TABLET | Freq: Every day | ORAL | Status: DC
  Administered 2015-02-19 – 2015-02-21 (×3): 250 mg via ORAL
  Filled 2015-02-18 (×3): qty 1

## 2015-02-18 MED ORDER — GABAPENTIN 300 MG PO CAPS
600.0000 mg | ORAL_CAPSULE | Freq: Three times a day (TID) | ORAL | Status: DC
  Administered 2015-02-18 – 2015-02-24 (×17): 600 mg via ORAL
  Filled 2015-02-18 (×17): qty 2

## 2015-02-18 MED ORDER — LEVOFLOXACIN 500 MG PO TABS
500.0000 mg | ORAL_TABLET | Freq: Once | ORAL | Status: AC
  Administered 2015-02-18: 500 mg via ORAL
  Filled 2015-02-18 (×2): qty 1

## 2015-02-18 MED ORDER — NYSTATIN 100000 UNIT/GM EX POWD
Freq: Three times a day (TID) | CUTANEOUS | Status: DC
  Administered 2015-02-18 – 2015-02-24 (×19): via TOPICAL
  Filled 2015-02-18 (×2): qty 15

## 2015-02-18 MED ORDER — SODIUM CHLORIDE 0.9 % IV SOLN: Freq: Once | INTRAVENOUS | Status: DC

## 2015-02-18 NOTE — Progress Notes (Signed)
ANTIBIOTIC CONSULT NOTE - INITIAL  Pharmacy Consult for Vancomycin Indication: Osteomyelitis  No Known Allergies  Patient Measurements: Height: 5\' 8"  (172.7 cm) Weight: 262 lb 2 oz (118.899 kg) IBW/kg (Calculated) : 63.9 Adjusted Body Weight:   Vital Signs: Temp: 100.1 F (37.8 C) (05/14 1252) Temp Source: Oral (05/14 1252) BP: 127/57 mmHg (05/14 1252) Pulse Rate: 86 (05/14 1252) Intake/Output from previous day: 05/13 0701 - 05/14 0700 In: 330 [I.V.:20; Blood:310] Out: 800 [Urine:800]   Labs:  Recent Labs  02/17/15 1059 02/17/15 1655 02/18/15 0557  WBC 4.0  --  3.6  HGB 6.7* 6.4* 7.4*  PLT 259  --  255  CREATININE 2.06*  --  1.80*   Estimated Creatinine Clearance: 44.5 mL/min (by C-G formula based on Cr of 1.8).  Recent Labs  02/17/15 1655 02/18/15 0453  VANCOTROUGH 24*  --   VANCORANDOM  --  23     Microbiology: No results found for this or any previous visit (from the past 720 hour(s)).  Medical History: Past Medical History  Diagnosis Date  . Obesity, unspecified   . Unspecified vitamin D deficiency   . Pure hypercholesterolemia   . Neuropathy 2011  . Chicken pox   . Coronary artery disease   . CHF (congestive heart failure) 07/19/2012    EF 15% by cardiac catheterization.  Admission.  . Hypertension   . Anemia   . Automatic implantable cardioverter-defibrillator in situ   . Type II diabetes mellitus   . Myocardial infarct 07/19/12    s/p cardiac catheterization with stenting to LAD.  Marland Kitchen History of blood transfusion ~ 2011    "plasma; had neuropathy; couldn't walk"  . OSA on CPAP     Moderate with AHI 23/hr and now on CPAP at 16cm H2O.  Her DME is [facility]    Medications:  Scheduled:  . sodium chloride   Intravenous Once  . carvedilol  25 mg Oral BID WC  . doxycycline  100 mg Oral BID  . ferrous sulfate  325 mg Oral BID WC  . gabapentin  600 mg Oral TID  . glimepiride  4 mg Oral BID AC  . hydrALAZINE  50 mg Oral TID  . insulin aspart  0-9  Units Subcutaneous TID WC  . isosorbide mononitrate  60 mg Oral Daily  . [START ON 02/19/2015] levofloxacin  250 mg Oral Daily  . levofloxacin  500 mg Oral Once  . linagliptin  5 mg Oral Daily  . nortriptyline  50 mg Oral QHS  . nystatin   Topical TID  . pantoprazole (PROTONIX) IV  40 mg Intravenous Q12H  . pravastatin  40 mg Oral QHS  . torsemide  40 mg Oral Daily   Assessment: Pharmacy consulted to continue vancomycin regimen from outpatient. Patient is seen by Dr Ola Spurr as outpatient for Osteomyelitis. Patient has CHF, and unknown baseline renal function. Was ordered vancomycin 1500mg  IV Q24H as an outpatient. Unclear when last dose of vancomycin was.    SCr: 1.8 Vancomycin levels -5/13 @ 1700: 37mcg/ml -5/14@ 0500: 73mcg/ml  Goal of Therapy:  Vancomycin trough level 15-20 mcg/ml  Plan:  Vancomycin level remains supratherapeutic. Suspect accumulation due to body mass. Will recheck random vancomycin level and serum creatinine with AM labs. Will continue to hold vancomycin until trough therapeutic  Measure antibiotic drug levels at steady state Follow up culture results  Rexene Edison, PharmD 02/18/2015 1:31 PM

## 2015-02-18 NOTE — Consult Note (Signed)
  GI Inpatient Follow-up Note  Patient Identification: [name] is a 59 y.o. female  Subjective: No complaints. Received 1 unit of PRBC yesterday. Hgb upto 7.4.  Had EF of only 15% in 2013. No repeat Echo since. Had stress test earlier this year. Results unknown. Sees cardiologist in [place]. Pt aware of increased risks for EGD/Colon.  Scheduled Inpatient Medications:  . carvedilol  25 mg Oral BID WC  . doxycycline  100 mg Oral BID  . ferrous sulfate  325 mg Oral BID WC  . gabapentin  600 mg Oral TID  . glimepiride  4 mg Oral BID AC  . hydrALAZINE  50 mg Oral TID  . insulin aspart  0-9 Units Subcutaneous TID WC  . isosorbide mononitrate  60 mg Oral Daily  . [START ON 02/19/2015] levofloxacin  250 mg Oral Daily  . levofloxacin  500 mg Oral Once  . linagliptin  5 mg Oral Daily  . nortriptyline  50 mg Oral QHS  . nystatin   Topical TID  . pantoprazole (PROTONIX) IV  40 mg Intravenous Q12H  . pravastatin  40 mg Oral QHS  . torsemide  40 mg Oral Daily  . vancomycin  1,500 mg Intravenous Q24H    Continuous Inpatient Infusions:     PRN Inpatient Medications:  acetaminophen **OR** acetaminophen, alum & mag hydroxide-simeth, nitroGLYCERIN, ondansetron **OR** ondansetron (ZOFRAN) IV, senna-docusate  Review of Systems: Constitutional: Weight is stable.  Eyes: No changes in vision. ENT: No oral lesions, sore throat.  GI: see HPI.  Heme/Lymph: No easy bruising.  CV: No chest pain.  GU: No hematuria.  Integumentary: No rashes.  Neuro: No headaches.  Psych: No depression/anxiety.  Endocrine: No heat/cold intolerance.  Allergic/Immunologic: No urticaria.  Resp: No cough, SOB.  Musculoskeletal: No joint swelling.    Physical Examination: BP 125/54 mmHg  Pulse 82  Temp(Src) 99.3 F (37.4 C) (Oral)  Resp 18  Ht 5\' 8"  (1.727 m)  Wt 118.899 kg (262 lb 2 oz)  BMI 39.87 kg/m2  SpO2 90% Gen: NAD, alert and oriented x 4 HEENT: PEERLA, EOMI, Neck: supple, no JVD or  thyromegaly Chest: CTA bilaterally, no wheezes, crackles, or other adventitious sounds CV: RRR, no m/g/c/r Abd: soft, NT, ND, +BS in all four quadrants; no HSM, guarding, ridigity, or rebound tenderness Ext: no edema, well perfused with 2+ pulses, Skin: no rash or lesions noted Lymph: no LAD  Data: Lab Results  Component Value Date   WBC 3.6 02/18/2015   HGB 7.4* 02/18/2015   HCT 23.5* 02/18/2015   MCV 71.1* 02/18/2015   PLT 255 02/18/2015    Recent Labs Lab 02/17/15 1059 02/17/15 1655 02/18/15 0557  HGB 6.7* 6.4* 7.4*   Lab Results  Component Value Date   NA 133* 02/18/2015   K 3.7 02/18/2015   CL 99* 02/18/2015   CO2 26 02/18/2015   BUN 44* 02/18/2015   CREATININE 1.80* 02/18/2015   Lab Results  Component Value Date   ALT <8 01/16/2015   AST 18 01/16/2015   ALKPHOS 76 01/16/2015   BILITOT 0.4 01/16/2015   No results for input(s): APTT, INR, PTT in the last 168 hours. Assessment/Plan: [name] is a 59 y.o. female with symptomatic anemia.    Recommendations: Would like either repeat Echo or clearance from cardiology before scheduling EGSD/colon off plavix. thanks  Please call with questions or concerns.  Precilla Purnell, Lupita Dawn, MD

## 2015-02-18 NOTE — Progress Notes (Signed)
Pt with low grade fever. No cough/SOB or urinary symptoms.  tx when temp<99.  Monitor Check UA/CXR

## 2015-02-18 NOTE — Progress Notes (Signed)
[facility] - [place] at [facility]   PATIENT NAME: [name]    MR#:  [id number]  DATE OF BIRTH:  [birth year]  SUBJECTIVE:  Patient is still feeling fatigued. She received 1 unit of PRBCs yesterday. She tolerated this well. She denies melena or hematochezia.  REVIEW OF SYSTEMS:    Review of Systems  Constitutional: Positive for malaise/fatigue. Negative for fever and chills.  Cardiovascular: Negative for chest pain, palpitations and leg swelling.  Gastrointestinal: Negative for abdominal pain.  Genitourinary: Negative for urgency.  Skin: Negative for itching and rash.  Neurological: Negative for dizziness and tingling.    Tolerating Diet:yes      DRUG ALLERGIES:  No Known Allergies  VITALS:  Blood pressure 125/54, pulse 82, temperature 99.3 F (37.4 C), temperature source Oral, resp. rate 18, height 5\' 8"  (1.727 m), weight 118.899 kg (262 lb 2 oz), SpO2 90 %.  PHYSICAL EXAMINATION:   Physical Exam  Constitutional: She is oriented to person, place, and time and well-developed, well-nourished, and in no distress. No distress.  HENT:  Head: Normocephalic and atraumatic.  Eyes: Pupils are equal, round, and reactive to light. Left eye exhibits no discharge.  Neck: Normal range of motion. Neck supple.  Cardiovascular: Normal rate and regular rhythm.   No murmur heard. Pulmonary/Chest: Effort normal and breath sounds normal. No respiratory distress. She has no wheezes. She has no rales.  Abdominal: Bowel sounds are normal. She exhibits no distension.  Musculoskeletal: Normal range of motion. She exhibits no edema.  Neurological: She is alert and oriented to person, place, and time. No cranial nerve deficit.  Skin: Skin is warm and dry.  Psychiatric: Affect normal.      LABORATORY PANEL:   CBC  Recent Labs Lab 02/18/15 0557  WBC 3.6  HGB 7.4*  HCT 23.5*  PLT 255    ------------------------------------------------------------------------------------------------------------------  Chemistries   Recent Labs Lab 02/18/15 0557  NA 133*  K 3.7  CL 99*  CO2 26  GLUCOSE 134*  BUN 44*  CREATININE 1.80*  CALCIUM 7.9*   ------------------------------------------------------------------------------------------------------------------  Cardiac Enzymes No results for input(s): TROPONINI in the last 168 hours. ------------------------------------------------------------------------------------------------------------------  RADIOLOGY:  No results found.   ASSESSMENT AND PLAN:   59 year old female who was sent emergency department after receiving noticed that her hemoglobin was low.  1. Acute on chronic blood loss anemia: Patient will have an EGD and colonoscopy on Monday. She received 1 unit of PRBCs. Her hemoglobin is up to 7.4. She is consented for 1 more unit of blood. I have already written orders. Her aspirin and Plavix are on hold for now. She will continue on Protonix IV twice a day.iron studies are consistent with iron deficiency anemia. I appreciate gastroenterology consultation.  2. Left heel osteomyelitis:patient will continue on current antibiotics including vancomycin, Levaquin and doxycycline. Pharmacy is helping to dose his medications.  3. Diabetes: Patient is an ADA diet, sliding scale insulin, and her oral diabetic medications the exception of metformin.  4. Coronary artery disease with coronary stent: Patient will be continued on outpatient medications. Plavix and aspirin are on hold due to problem #1.  5.Chronic systolic heart failure: Patient is not in acute exacerbation at this time. Dr. Bluford Kaufmann would like cardiac clearance prior to EGD/colonoscopy.I have consulted [facility] cardi[facility] (Dr. Unknown Foley is her cardiologist)  6. Hyponatremia: Her sodium is improved with IV fluids.      Management plans discussed with the patient  and she is in agreement.  CODE STATUS:  DO NOT RESUSCITATE  TOTAL TIME TAKING CARE OF THIS PATIENT: 30 minutes.   POSSIBLE D/C in 2  DAYS, DEPENDING ON CLINICAL CONDITION.   Rylee Nuzum M.D on 02/18/2015 at 12:19 PM  Between 7am to 6pm - Pager - [phone] After 6pm go to [email or web address] - password [email or web address] [facility]  [place] [facility]  Office  [phone]  CC: Primary care physician; Nilda Simmer, MD

## 2015-02-19 DIAGNOSIS — Z95810 Presence of automatic (implantable) cardiac defibrillator: Secondary | ICD-10-CM

## 2015-02-19 DIAGNOSIS — N189 Chronic kidney disease, unspecified: Secondary | ICD-10-CM

## 2015-02-19 LAB — TYPE AND SCREEN
ABO/RH(D): O POS
ANTIBODY SCREEN: NEGATIVE
UNIT DIVISION: 0
Unit division: 0

## 2015-02-19 LAB — CBC
HCT: 24.7 % — ABNORMAL LOW (ref 35.0–47.0)
Hemoglobin: 8 g/dL — ABNORMAL LOW (ref 12.0–16.0)
MCH: 23.4 pg — AB (ref 26.0–34.0)
MCHC: 32.4 g/dL (ref 32.0–36.0)
MCV: 72.2 fL — ABNORMAL LOW (ref 80.0–100.0)
Platelets: 227 10*3/uL (ref 150–440)
RBC: 3.42 MIL/uL — ABNORMAL LOW (ref 3.80–5.20)
RDW: 19.6 % — AB (ref 11.5–14.5)
WBC: 3.5 10*3/uL — ABNORMAL LOW (ref 3.6–11.0)

## 2015-02-19 LAB — GLUCOSE, CAPILLARY
GLUCOSE-CAPILLARY: 123 mg/dL — AB (ref 65–99)
GLUCOSE-CAPILLARY: 90 mg/dL (ref 65–99)
Glucose-Capillary: 109 mg/dL — ABNORMAL HIGH (ref 65–99)
Glucose-Capillary: 120 mg/dL — ABNORMAL HIGH (ref 65–99)

## 2015-02-19 LAB — VANCOMYCIN, RANDOM: VANCOMYCIN RM: 17 ug/mL

## 2015-02-19 LAB — CREATININE, SERUM
Creatinine, Ser: 1.71 mg/dL — ABNORMAL HIGH (ref 0.44–1.00)
GFR calc non Af Amer: 31 mL/min — ABNORMAL LOW (ref >60)
GFR, EST AFRICAN AMERICAN: 36 mL/min — AB (ref >60)

## 2015-02-19 MED ORDER — SODIUM CHLORIDE 0.9 % IV SOLN
INTRAVENOUS | Status: DC
  Administered 2015-02-19: 12:00:00 via INTRAVENOUS

## 2015-02-19 MED ORDER — BISACODYL 5 MG PO TBEC
10.0000 mg | DELAYED_RELEASE_TABLET | Freq: Once | ORAL | Status: AC
  Administered 2015-02-19: 10 mg via ORAL
  Filled 2015-02-19: qty 2

## 2015-02-19 MED ORDER — PEG 3350-KCL-NA BICARB-NACL 420 G PO SOLR
4000.0000 mL | Freq: Once | ORAL | Status: AC
  Administered 2015-02-19: 4000 mL via ORAL
  Filled 2015-02-19: qty 4000

## 2015-02-19 MED ORDER — VANCOMYCIN HCL 10 G IV SOLR
1500.0000 mg | INTRAVENOUS | Status: DC
  Administered 2015-02-19 – 2015-02-23 (×4): 1500 mg via INTRAVENOUS
  Filled 2015-02-19 (×5): qty 1500

## 2015-02-19 NOTE — Consult Note (Signed)
  GI Inpatient Follow-up Note  Patient Identification: [name] is a 59 y.o. female with anemia and possible melena.  Subjective: More alert. No complaints today. Hgb above 8 now. Scheduled Inpatient Medications:  . sodium chloride   Intravenous Once  . carvedilol  25 mg Oral BID WC  . doxycycline  100 mg Oral BID  . ferrous sulfate  325 mg Oral BID WC  . gabapentin  600 mg Oral TID  . glimepiride  4 mg Oral BID AC  . hydrALAZINE  50 mg Oral TID  . insulin aspart  0-9 Units Subcutaneous TID WC  . isosorbide mononitrate  60 mg Oral Daily  . levofloxacin  250 mg Oral Daily  . linagliptin  5 mg Oral Daily  . nortriptyline  50 mg Oral QHS  . nystatin   Topical TID  . pantoprazole (PROTONIX) IV  40 mg Intravenous Q12H  . pravastatin  40 mg Oral QHS  . torsemide  40 mg Oral Daily  . vancomycin  1,500 mg Intravenous Q36H    Continuous Inpatient Infusions:     PRN Inpatient Medications:  acetaminophen **OR** acetaminophen, alum & mag hydroxide-simeth, nitroGLYCERIN, ondansetron **OR** ondansetron (ZOFRAN) IV, senna-docusate  Review of Systems: Constitutional: Weight is stable.  Eyes: No changes in vision. ENT: No oral lesions, sore throat.  GI: see HPI.  Heme/Lymph: No easy bruising.  CV: No chest pain.  GU: No hematuria.  Integumentary: No rashes.  Neuro: No headaches.  Psych: No depression/anxiety.  Endocrine: No heat/cold intolerance.  Allergic/Immunologic: No urticaria.  Resp: No cough, SOB.  Musculoskeletal: No joint swelling.    Physical Examination: BP 119/61 mmHg  Pulse 80  Temp(Src) 98.9 F (37.2 C) (Oral)  Resp 20  Ht 5\' 8"  (1.727 m)  Wt 118.899 kg (262 lb 2 oz)  BMI 39.87 kg/m2  SpO2 96% Gen: NAD, alert and oriented x 4 HEENT: PEERLA, EOMI, Neck: supple, no JVD or thyromegaly Chest: CTA bilaterally, no wheezes, crackles, or other adventitious sounds CV: RRR, no m/g/c/r Abd: soft, NT, ND, +BS in all four quadrants; no HSM, guarding, ridigity,  or rebound tenderness Ext: no edema, well perfused with 2+ pulses, Skin: no rash or lesions noted Lymph: no LAD  Data: Lab Results  Component Value Date   WBC 3.5* 02/19/2015   HGB 8.0* 02/19/2015   HCT 24.7* 02/19/2015   MCV 72.2* 02/19/2015   PLT 227 02/19/2015    Recent Labs Lab 02/17/15 1655 02/18/15 0557 02/19/15 0605  HGB 6.4* 7.4* 8.0*   Lab Results  Component Value Date   NA 133* 02/18/2015   K 3.7 02/18/2015   CL 99* 02/18/2015   CO2 26 02/18/2015   BUN 44* 02/18/2015   CREATININE 1.71* 02/19/2015   Lab Results  Component Value Date   ALT <8 01/16/2015   AST 18 01/16/2015   ALKPHOS 76 01/16/2015   BILITOT 0.4 01/16/2015   No results for input(s): APTT, INR, PTT in the last 168 hours. Assessment/Plan: Ms. Schenker is a 59 y.o. female with GI bleeding. No active bleeding.   Recommendations: Await cardiology input. Plan on doing EGD/colon tomorrow afternoon unless cardiology does not clear pt or plan tests before endoscopic procedures. thanks  Please call with questions or concerns.  Yer Olivencia, Lupita Dawn, MD

## 2015-02-19 NOTE — Consult Note (Signed)
CARDIOLOGY CONSULT NOTE  Patient ID: [name], MRN: [id number], DOB/AGE: [birth year] 59 y.o. Admit date: 02/17/2015 Date of Consult: 02/19/2015  Primary Physician: [name], MD Primary Cardiologist:  DM/SK  Chief Complaint: Preop   HPI [name] is a 59 y.o. female  Obese woman with a history of  ischemic cardiomyopathy and congestive heart failure with previously implanted CRT-D 9/15  device interrogation 4/15 demonstrated 99% biventricular pacing.. At that time her ejection fraction was 30%. Most recent echocardiogram 1/16 demonstrated near normalization of LV function with an EF of 50-55%    She has a history of a chronically occluded LAD and PDA; she underwent stenting of her CTO LAD 2013.  Ejection fraction 7/15 was 30%.  She also has obstructive sleep apnea now treated with CPAP. She also has diabetes and hypertension. He has a chronic history of anemia and is now admitted with interval worsening (hemoglobin 8.7-9--6.4.) GI bleeding. She is microcytic  Over recent weeks she has noted worsening exercise tolerance. There has been no chest pain.  She has chronic lower extremity massive edema. She also developed a ulcer on the heel of her left foot which apparently included osteomyelitis. She is under the care of the wound center   Past Medical History  Diagnosis Date  . Obesity, unspecified   . Unspecified vitamin D deficiency   . Pure hypercholesterolemia   . Neuropathy 2011  . Chicken pox   . Coronary artery disease   . CHF (congestive heart failure) 07/19/2012    EF 15% by cardiac catheterization.  Admission.  . Hypertension   . Anemia   . Automatic implantable cardioverter-defibrillator in situ   . Type II diabetes mellitus   . Myocardial infarct 07/19/12    s/p cardiac catheterization with stenting to LAD.  Marland Kitchen History of blood transfusion ~ 2011    "plasma; had neuropathy; couldn't walk"  . OSA on CPAP     Moderate with AHI 23/hr and now on  CPAP at 16cm H2O.  Her DME is [facility]      Surgical History:  Past Surgical History  Procedure Laterality Date  . Lumbar disc surgery  1996    L4-5  . Incision and drainage abscess Right 2007    groin; with ICU stay due to sepsis.  . Bilateral oophorectomy  01/2011    ovarian cyst benign  . Bi-ventricular implantable cardioverter defibrillator  (crt-d)  07/06/2014  . Laparoscopic cholecystectomy  2011  . Back surgery    . Coronary angioplasty with stent placement Left 07/2012    new onset systolic CHF; elevated troponins.  Cardiac catheterization with stenting to LAD; EF 15%.  2D-echo: EF 20-25%.  . Vaginal hysterectomy  01/2011    Fibroids/DUB.  Ovaries removed. [place].  . Tubal ligation  1981  . Left heart catheterization with coronary angiogram N/A 07/21/2012    Procedure: LEFT HEART CATHETERIZATION WITH CORONARY ANGIOGRAM;  Surgeon: Jolaine Artist, MD;  Location: [facility] CATH LAB;  Service: Cardiovascular;  Laterality: N/A;  . Percutaneous coronary stent intervention (pci-s) N/A 07/23/2012    Procedure: PERCUTANEOUS CORONARY STENT INTERVENTION (PCI-S);  Surgeon: Sherren Mocha, MD;  Location: [facility] CATH LAB;  Service: Cardiovascular;  Laterality: N/A;  . Bi-ventricular implantable cardioverter defibrillator N/A 07/06/2014    Procedure: BI-VENTRICULAR IMPLANTABLE CARDIOVERTER DEFIBRILLATOR  (CRT-D);  Surgeon: Deboraha Sprang, MD;  Location: [facility] CATH LAB;  Service: Cardiovascular;  Laterality: N/A;     Home Meds: Prior to Admission medications   Medication  Sig Start Date End Date Taking? Authorizing Provider  aspirin EC 81 MG tablet Take 1 tablet (81 mg total) by mouth daily. 03/31/14  Yes Wardell Honour, MD  carvedilol (COREG) 25 MG tablet Take 1 tablet (25 mg total) by mouth 2 (two) times daily with a meal. 12/12/14  Yes Jolaine Artist, MD  clopidogrel (PLAVIX) 75 MG tablet Take 1 tablet (75 mg total) by mouth daily. 03/31/14  Yes Wardell Honour, MD  ferrous sulfate 325 (65 FE) MG tablet  Take 325 mg by mouth 2 (two) times daily with a meal.   Yes Historical Provider, MD  gabapentin (NEURONTIN) 600 MG tablet Take 1 tablet (600 mg total) by mouth 3 (three) times daily. 03/31/14  Yes Wardell Honour, MD  glimepiride (AMARYL) 4 MG tablet Take 1 tablet (4 mg total) by mouth 2 (two) times daily before a meal. 03/31/14  Yes Wardell Honour, MD  hydrALAZINE (APRESOLINE) 50 MG tablet Take 1 tablet (50 mg total) by mouth 3 (three) times daily. 03/31/14  Yes Wardell Honour, MD  isosorbide mononitrate (IMDUR) 60 MG 24 hr tablet Take 1 tablet (60 mg total) by mouth daily. 12/12/14  Yes Jolaine Artist, MD  metFORMIN (GLUCOPHAGE) 1000 MG tablet Take 1 tablet (1,000 mg total) by mouth daily. 01/10/15  Yes Leandrew Koyanagi, MD  nitroGLYCERIN (NITROSTAT) 0.4 MG SL tablet Place 1 tablet (0.4 mg total) under the tongue every 5 (five) minutes as needed for chest pain. 07/26/12  Yes Coral Spikes, DO  nortriptyline (PAMELOR) 50 MG capsule Take 1 capsule (50 mg total) by mouth at bedtime. 03/31/14  Yes Wardell Honour, MD  pravastatin (PRAVACHOL) 40 MG tablet Take 1 tablet (40 mg total) by mouth at bedtime. 03/31/14  Yes Wardell Honour, MD  sitaGLIPtin (JANUVIA) 100 MG tablet Take 1 tablet (100 mg total) by mouth daily. 01/10/15  Yes Leandrew Koyanagi, MD  torsemide (DEMADEX) 20 MG tablet Take 2 tablets (40 mg total) by mouth daily. 12/19/14  Yes Jolaine Artist, MD  traMADol (ULTRAM) 50 MG tablet Take 1 tablet (50 mg total) by mouth every 6 (six) hours as needed. For pain 01/16/15  Yes Wardell Honour, MD  vancomycin in sodium chloride 0.9 % 500 mL Inject into the vein daily.   Yes Historical Provider, MD  doxycycline (VIBRAMYCIN) 100 MG capsule Take 1 capsule (100 mg total) by mouth 2 (two) times daily. Patient not taking: Reported on 02/17/2015 01/16/15   Wardell Honour, MD  fluticasone [facility]) 50 MCG/ACT nasal spray 1 spray each nostril twice a day Patient not taking: Reported on 02/17/2015 12/30/14   Leandrew Koyanagi, MD  glucose blood test strip Test blood sugar daily. Dx code: 250.00 03/17/14   Wendie Agreste, MD  levofloxacin (LEVAQUIN) 500 MG tablet Take 1 tablet (500 mg total) by mouth daily. Patient not taking: Reported on 02/17/2015 01/16/15   Wardell Honour, MD    Inpatient Medications:  . sodium chloride   Intravenous Once  . bisacodyl  10 mg Oral Once  . carvedilol  25 mg Oral BID WC  . doxycycline  100 mg Oral BID  . ferrous sulfate  325 mg Oral BID WC  . gabapentin  600 mg Oral TID  . glimepiride  4 mg Oral BID AC  . hydrALAZINE  50 mg Oral TID  . insulin aspart  0-9 Units Subcutaneous TID WC  . isosorbide mononitrate  60 mg Oral  Daily  . levofloxacin  250 mg Oral Daily  . linagliptin  5 mg Oral Daily  . nortriptyline  50 mg Oral QHS  . nystatin   Topical TID  . pantoprazole (PROTONIX) IV  40 mg Intravenous Q12H  . polyethylene glycol-electrolytes  4,000 mL Oral Once  . pravastatin  40 mg Oral QHS  . torsemide  40 mg Oral Daily  . vancomycin  1,500 mg Intravenous Q36H    Allergies: No Known Allergies  History   Social History  . Marital Status: Married    Spouse Name: N/A  . Number of Children: 2  . Years of Education: 12   Occupational History  . disabled     03/2010 for peripheral neuropathy  . home daycare     x 20 yrs.   Social History Main Topics  . Smoking status: Never Smoker   . Smokeless tobacco: Never Used  . Alcohol Use: No  . Drug Use: No  . Sexual Activity: No   Other Topics Concern  . Not on file   Social History Narrative   Marital status:Married x 42 yrs. Happily married, no abuse.       Children:  2 children (daughter 46, son 60). One grandson and 2 step grandchildren. Daughter and grandson live in home with pt and spouse.       Lives: with husband.      Employment: disability for peripheral neuropathy 2012.  Previously had home daycare.      Tobacco: none      Alcohol: none      Drugs: none      Exercise: none.  [employer].      Pets: dog.      Always uses seat belts, smoke detectors in home.      No guns in the home.       Caffeine use: 2 cups coffee per day.       Nutrition: Well balanced diet.     Family History  Problem Relation Age of Onset  . Diabetes Mother   . Hypertension Mother   . Arthritis Mother     knees, lumbar DDD, cervical DDD  . Cancer Father     prostate,skin,lymphoma.  . Obesity Brother   . Diabetes Maternal Grandmother   . Diabetes Paternal Grandmother      ROS:  Please see the history of present illness.   Negative except Slowly healing ulcer care center left foot  All other systems reviewed and negative.    Physical Exam:   Blood pressure 119/61, pulse 80, temperature 98.9 F (37.2 C), temperature source Oral, resp. rate 20, height 5\' 8"  (1.727 m), weight 262 lb 2 oz (118.899 kg), SpO2 96 %. General: Well developed,  Morbidly obese   female in no acute distress. Head: Normocephalic, atraumatic, sclera non-icteric, no xanthomas, nares are without discharge. EENT: normal  Lymph Nodes:  none Neck: Negative for carotid bruits. JVD not elevated. Back:without scoliosis kyphosis  Lungs:  Bilateral lower lung rales and rhonchi  . Breathing is unlabored. Heart: RRR with S1 S2. 2 component rub versus to and fro murmur . No rubs, or gallops appreciated. Abdomen: Soft, non-tender, non-distended with normoactive bowel sounds. No hepatomegaly. No rebound/guarding. No obvious abdominal masses. Msk:  Strength and tone appear normal for age. Extremities: No clubbing or cyanosis. 2 + edema with massive lower extremity fluid accumulation convulsant dorsa of the feet suggesting a non-venous cause  Distal pedal pulses are 2+ and equal bilaterally.  Skin: Warm and Dry Neuro: Alert and oriented X 3. CN III-XII intact Grossly normal sensory and motor function . Psych:  Responds to questions appropriately with a normal affect.      Labs: Cardiac Enzymes No results for input(s):  CKTOTAL, CKMB, TROPONINI in the last 72 hours. CBC Lab Results  Component Value Date   WBC 3.5* 02/19/2015   HGB 8.0* 02/19/2015   HCT 24.7* 02/19/2015   MCV 72.2* 02/19/2015   PLT 227 02/19/2015   PROTIME: No results for input(s): LABPROT, INR in the last 72 hours. Chemistry  Recent Labs Lab 02/18/15 0557 02/19/15 0605  NA 133*  --   K 3.7  --   CL 99*  --   CO2 26  --   BUN 44*  --   CREATININE 1.80* 1.71*  CALCIUM 7.9*  --   GLUCOSE 134*  --    Lipids Lab Results  Component Value Date   CHOL 124 03/30/2014   HDL 27* 03/30/2014   LDLCALC 19 03/30/2014   TRIG 390* 03/30/2014   BNP PRO B NATRIURETIC PEPTIDE (BNP)  Date/Time Value Ref Range Status  08/15/2014 10:05 AM 3808.0* 0 - 125 pg/mL Final  07/20/2012 08:28 PM 5006.0* 0 - 125 pg/mL Final   Thyroid Function Tests: No results for input(s): TSH, T4TOTAL, T3FREE, THYROIDAB in the last 72 hours.  Invalid input(s): FREET3 Miscellaneous Lab Results  Component Value Date   DDIMER 1.77* 07/20/2012    Radiology/Studies:  Dg Chest 1 View  02/18/2015   CLINICAL DATA:  Fever and anemia.  EXAM: CHEST  1 VIEW  COMPARISON:  Radiograph 01/16/2015  FINDINGS: Interval placement of a right PICC line with tip at the junction of the right subclavian vein and superior vena cava. Left-sided pacemaker with continuous leads overlies normal cardiac silhouette. There is bibasilar atelectasis and small effusions mildly increased.  IMPRESSION: 1. Bibasilar atelectasis and small effusions increased mildly from prior. 2. PICC line at the junction of the subclavian vein and SVC.   Electronically Signed   By: Suzy Bouchard M.D.   On: 02/18/2015 14:55   Nm Bone 3 Phase Ltd  01/27/2015   CLINICAL DATA:  Chronic open wound of left foot for 6 weeks. Infection, swelling and diabetes.  EXAM: NUCLEAR MEDICINE 3-PHASE BONE SCAN  TECHNIQUE: Radionuclide angiographic images, immediate static blood pool images, and 3-hour delayed static images were  obtained of the feet after intravenous injection of radiopharmaceutical.  RADIOPHARMACEUTICALS:  23.49 Technetium 99 MDP  COMPARISON:  None.  FINDINGS: Vascular phase: There is slight asymmetric increased perfusion to the left hindfoot on the early vascular phase.  Blood pool phase: Asymmetric increased uptake localizing to the left hindfoot. Increased blood pool activity localizing to the second or third left phalanx also noted.  Delayed phase: Increased radiotracer uptake localizing to the midfoot and hindfoot regions of both feet noted. Increased delayed phase uptake localizes to the left second phalanx.  IMPRESSION: 1. There is increased uptake on all 3 phases localizing to the left hindfoot region. This may be a manifestation of osteomyelitis. Correlation with exact site of nonhealing ulcer. 2. There is increased uptake localizing to the second phalanx of the left foot on the blood pool and delayed phase images. 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 Underlying infection not excluded.   Electronically Signed   By: Kerby Moors M.D.   On: 01/27/2015 08:11    EKG: AV pacing   Assessment and Plan:  1 ischemic cardiomyopathy with interval resolution  2-CRT-D-[employer]  3-morbid obesity  4-pericarditis  5-GI bleeding  6-obstructive sleep apnea  7-peripheral edema  8-slowly healing ulcer on her left foot   9-chronic renal insufficiency grade 3  The patient presents with anemia or any symptoms of exercise intolerance in the context of resolved ischemic cardiomyopathy and chronic volume overload. On examination she has a systolic-diastolic continuous murmur versus 2 component friction rub; there is no evidence of pericardial tamponade (no JVD)  We will undertake an echocardiogram to assess the latter: However, I think the risks for undergoing evaluation of her GI blood loss are acceptable.   we have talked about the potential of weight loss  reduction surgery.   There has been no evidence of bacteremia associated with her chronic ulcer; there is apparently osteomyelitis.     Virl Axe

## 2015-02-19 NOTE — Progress Notes (Signed)
ANTIBIOTIC CONSULT NOTE - INITIAL  Pharmacy Consult for Antibiotic Dosing - Vancomycin/Levaquin/Doxycycline Indication: Osteomyelitis  No Known Allergies  Patient Measurements: Height: 5\' 8"  (172.7 cm) Weight: 262 lb 2 oz (118.899 kg) IBW/kg (Calculated) : 63.9 Adjusted Body Weight: 86kg  Vital Signs: Temp: 98.9 F (37.2 C) (05/15 0842) Temp Source: Oral (05/15 0842) BP: 119/61 mmHg (05/15 0842) Pulse Rate: 80 (05/15 0842) Intake/Output from previous day: 05/14 0701 - 05/15 0700 In: 576 [P.O.:240; Blood:336] Out: 600 [Urine:600]   Labs:  Recent Labs  02/17/15 1059 02/17/15 1655 02/18/15 0557 02/19/15 0605  WBC 4.0  --  3.6 3.5*  HGB 6.7* 6.4* 7.4* 8.0*  PLT 259  --  255 227  CREATININE 2.06*  --  1.80* 1.71*   Estimated Creatinine Clearance: 46.9 mL/min (by C-G formula based on Cr of 1.71).  Recent Labs  02/17/15 1655 02/18/15 0453 02/19/15 0612  VANCOTROUGH 24*  --   --   VANCORANDOM  --  23 17     Microbiology: No results found for this or any previous visit (from the past 720 hour(s)).  Medical History: Past Medical History  Diagnosis Date  . Obesity, unspecified   . Unspecified vitamin D deficiency   . Pure hypercholesterolemia   . Neuropathy 2011  . Chicken pox   . Coronary artery disease   . CHF (congestive heart failure) 07/19/2012    EF 15% by cardiac catheterization.  Admission.  . Hypertension   . Anemia   . Automatic implantable cardioverter-defibrillator in situ   . Type II diabetes mellitus   . Myocardial infarct 07/19/12    s/p cardiac catheterization with stenting to LAD.  Marland Kitchen History of blood transfusion ~ 2011    "plasma; had neuropathy; couldn't walk"  . OSA on CPAP     Moderate with AHI 23/hr and now on CPAP at 16cm H2O.  Her DME is [facility]    Medications:  Scheduled:  . sodium chloride   Intravenous Once  . carvedilol  25 mg Oral BID WC  . doxycycline  100 mg Oral BID  . ferrous sulfate  325 mg Oral BID WC  . gabapentin   600 mg Oral TID  . glimepiride  4 mg Oral BID AC  . hydrALAZINE  50 mg Oral TID  . insulin aspart  0-9 Units Subcutaneous TID WC  . isosorbide mononitrate  60 mg Oral Daily  . levofloxacin  250 mg Oral Daily  . linagliptin  5 mg Oral Daily  . nortriptyline  50 mg Oral QHS  . nystatin   Topical TID  . pantoprazole (PROTONIX) IV  40 mg Intravenous Q12H  . pravastatin  40 mg Oral QHS  . torsemide  40 mg Oral Daily  . vancomycin  1,500 mg Intravenous Q36H   Assessment: Pharmacy consulted to continue vancomycin regimen from outpatient. Patient is seen by Dr Ola Spurr as outpatient for Osteomyelitis. Patient has CHF, and unknown baseline renal function. Was ordered vancomycin 1500mg  IV Q24H as an outpatient. Unclear when last dose of vancomycin was.    Creatinine trending down, vancomycin level therapeutic today.   Goal of Therapy:  Vancomycin trough level 15-20 mcg/ml  Plan:   Will resume vancomycin dosing today. Ordered vancomycin 1500mg  IV Q36H. Will check trough prior to second dose to assure patient is clearing drug.  Pt on levaquin 500mg  daily as outpatient, dose adjusted for renal function at this time to levaquin 500mg  PO x 1, then 250mg  daily. Will continue to follow renal function  and adjust as indicated.  Continue doxycycline 100mg  PO Q12H   Pharmacy to follow per consult  Rexene Edison, PharmD Clinical Pharmacist 02/19/2015 9:35 AM

## 2015-02-19 NOTE — Progress Notes (Signed)
[facility] - [place] at [facility]   PATIENT NAME: [name]    MR#:  [id number]  DATE OF BIRTH:  [birth year]  SUBJECTIVE:  Patient is sleeping REVIEW OF SYSTEMS:    Review of Systems  Unable to perform ROS: other   patient sleeping  Tolerating Diet:yes      DRUG ALLERGIES:  No Known Allergies  VITALS:  Blood pressure 119/61, pulse 80, temperature 98.9 F (37.2 C), temperature source Oral, resp. rate 20, height 5\' 8"  (1.727 m), weight 118.899 kg (262 lb 2 oz), SpO2 96 %.  PHYSICAL EXAMINATION:   Physical Exam  Constitutional: She is oriented to person, place, and time and well-developed, well-nourished, and in no distress. No distress.  HENT:  Head: Normocephalic and atraumatic.  Eyes: Pupils are equal, round, and reactive to light. Left eye exhibits no discharge.  Neck: Normal range of motion. Neck supple.  Cardiovascular: Normal rate and regular rhythm.   No murmur heard. Pulmonary/Chest: Effort normal and breath sounds normal. No respiratory distress. She has no wheezes. She has no rales.  Abdominal: Bowel sounds are normal. She exhibits no distension.  Musculoskeletal: Normal range of motion. She exhibits no edema.  Neurological: She is alert and oriented to person, place, and time. No cranial nerve deficit.  Skin: Skin is warm and dry.  Psychiatric: Affect normal.      LABORATORY PANEL:   CBC  Recent Labs Lab 02/19/15 0605  WBC 3.5*  HGB 8.0*  HCT 24.7*  PLT 227   ------------------------------------------------------------------------------------------------------------------  Chemistries   Recent Labs Lab 02/18/15 0557 02/19/15 0605  NA 133*  --   K 3.7  --   CL 99*  --   CO2 26  --   GLUCOSE 134*  --   BUN 44*  --   CREATININE 1.80* 1.71*  CALCIUM 7.9*  --    ------------------------------------------------------------------------------------------------------------------  Cardiac Enzymes No results  for input(s): TROPONINI in the last 168 hours. ------------------------------------------------------------------------------------------------------------------  RADIOLOGY:  Dg Chest 1 View  02/18/2015    IMPRESSION: 1. Bibasilar atelectasis and small effusions increased mildly from prior. 2. PICC line at the junction of the subclavian vein and SVC.   Electronically Signed   By: Genevive Bi M.D.   On: 02/18/2015 14:55     ASSESSMENT AND PLAN:   59 year old female who was sent emergency department after receiving noticed that her hemoglobin was low.  1. Acute on chronic blood loss anemia: Patient will have an EGD and colonoscopy on Monday. Patient has received 2 units of PRBCs.. Her aspirin and Plavix are on hold for now. She will continue on Protonix IV twice a day.ron studies are consistent with iron deficiency anemia. She is already on iron supplementation. Hemoglobin is ordered for the a.m. Cardiology has been consulted for reprocedure cardiac risk stratification as per the request of Dr. Bluford Kaufmann.  2. Left heel osteomyelitis: Pient will continue on current antibiotics including vancomycin, Levaquin and doxycycline. Pharmacy is helping to dose his medications.  3. Diabetes: Patient is an ADA diet, sliding scale insulin, and her oral diabetic medications the exception of metformin.  4. Coronary artery disease with coronary stent: Patient will be continued on outpatient medications. Plavix and aspirin are on hold due to problem #1.  5.Chronic systolic heart failure: Patient is not in acute exacerbation at this time. Dr. Bluford Kaufmann would like cardiac clearance prior to EGD/colonoscopy.I have consulted [facility] (Dr. Unknown Foley is her cardiologist).   6. Hyponatremia: Her sodium is improved with  IV fluids. I will repeat a sodium level in a.m.        CODE STATUS: DO NOT RESUSCITATE  TOTAL TIME TAKING CARE OF THIS PATIENT: 2 minutes.   POSSIBLE D/C in 1-2 DAYS, DEPENDING ON CLINICAL  CONDITION.   Bellarae Lizer M.D on 02/19/2015 at 11:49 AM  Between 7am to 6pm - Pager - [phone] After 6pm go to [email or web address] - password [email or web address] [facility]  [place] [facility]  Office  [phone]  CC: Primary care physician; Nilda Simmer, MD

## 2015-02-20 ENCOUNTER — Ambulatory Visit: Payer: PPO | Admitting: Surgery

## 2015-02-20 ENCOUNTER — Encounter
Admission: EM | Disposition: A | Discharge status: Home Health | Payer: Self-pay | Source: Home / Self Care | Attending: Internal Medicine

## 2015-02-20 ENCOUNTER — Encounter: Payer: Self-pay | Admitting: Anesthesiology

## 2015-02-20 ENCOUNTER — Inpatient Hospital Stay (HOSPITAL_COMMUNITY)
Admit: 2015-02-20 | Discharge: 2015-02-20 | Disposition: A | Discharge status: Home / Self Care | Payer: PPO | Attending: Internal Medicine | Admitting: Internal Medicine

## 2015-02-20 ENCOUNTER — Encounter: Payer: PPO | Admitting: Surgery

## 2015-02-20 DIAGNOSIS — I341 Nonrheumatic mitral (valve) prolapse: Secondary | ICD-10-CM

## 2015-02-20 LAB — GLUCOSE, CAPILLARY
GLUCOSE-CAPILLARY: 69 mg/dL (ref 65–99)
GLUCOSE-CAPILLARY: 65 mg/dL (ref 65–99)
GLUCOSE-CAPILLARY: 84 mg/dL (ref 65–99)
GLUCOSE-CAPILLARY: 108 mg/dL — AB (ref 65–99)
Glucose-Capillary: 49 mg/dL — ABNORMAL LOW (ref 65–99)
Glucose-Capillary: 119 mg/dL — ABNORMAL HIGH (ref 65–99)
Glucose-Capillary: 49 mg/dL — ABNORMAL LOW (ref 65–99)

## 2015-02-20 LAB — CREATININE, SERUM
CREATININE: 1.62 mg/dL — AB (ref 0.44–1.00)
GFR calc non Af Amer: 33 mL/min — ABNORMAL LOW (ref >60)
GFR, EST AFRICAN AMERICAN: 39 mL/min — AB (ref >60)

## 2015-02-20 LAB — OCCULT BLOOD X 1 CARD TO LAB, STOOL: Fecal Occult Bld: NEGATIVE

## 2015-02-20 LAB — VANCOMYCIN, TROUGH: Vancomycin Tr: 24 ug/mL (ref 10–20)

## 2015-02-20 LAB — HEMOGLOBIN: HEMOGLOBIN: 7.4 g/dL — AB (ref 12.0–16.0)

## 2015-02-20 SURGERY — EGD (ESOPHAGOGASTRODUODENOSCOPY)
Anesthesia: Monitor Anesthesia Care | Laterality: Left

## 2015-02-20 SURGERY — COLONOSCOPY WITH PROPOFOL
Anesthesia: Monitor Anesthesia Care

## 2015-02-20 MED ORDER — DEXTROSE 50 % IV SOLN
1.0000 | Freq: Once | INTRAVENOUS | Status: AC
  Administered 2015-02-20: 50 mL via INTRAVENOUS
  Filled 2015-02-20: qty 50

## 2015-02-20 MED ORDER — DEXTROSE-NACL 5-0.9 % IV SOLN
INTRAVENOUS | Status: DC
  Administered 2015-02-20: 11:00:00 via INTRAVENOUS

## 2015-02-20 MED ORDER — SODIUM CHLORIDE 0.9 % IV SOLN: INTRAVENOUS | Status: DC

## 2015-02-20 NOTE — OR Nursing (Signed)
Sao2 88% on arrival DR [facility] notified and o2 began at 2 l np.

## 2015-02-20 NOTE — Care Management Note (Signed)
Case Management Note  Patient Details  Name: DALIN KEATH MRN: [id number] Date of Birth: [birth year]  Subjective/Objective:   Admitted with low Hgb (7.4) and fatigue. Hx iron deficiency anemia. Colonoscopy and EGD cancelled today due to hypoxia. Currently on 2L/[place]. GI hopes to reschedule in the next few days.  Pt active with [facility] for nursing/ IV therapy and PICC line care due to osteomyolitis of left foot. Floydene Flock with  [facility] notified). She goes to the wound care center for hyperbaric treatments to foot.  Prior to admission, pt lived at home with her husband. She uses a walker.  CM to follow for discharged needs.          Action/Plan:    Expected Discharge Date:                  Expected Discharge Plan:  [place]  In-House Referral:     Discharge planning Services  CM Consult  Post Acute Care Choice:    Choice offered to:     DME Arranged:    DME Agency:     HH Arranged:    [place] Agency:  [place]  Status of Service:  In process, will continue to follow  Medicare Important Message Given:    Date Medicare IM Given:    Medicare IM give by:    Date Additional Medicare IM Given:    Additional Medicare Important Message give by:     If discussed at [place] of Stay Meetings, dates discussed:    Additional Comments:  Jolly Mango, RN 02/20/2015, 3:47 PM

## 2015-02-20 NOTE — H&P (Signed)
  Date of Initial H&P:02/19/2015  History reviewed, patient examined, no change in status, stable for surgery.

## 2015-02-20 NOTE — Progress Notes (Signed)
Inpatient Diabetes Program Recommendations  AACE/ADA: New Consensus Statement on Inpatient Glycemic Control (2013)  Target Ranges:  Prepandial:   less than 140 mg/dL      Peak postprandial:   less than 180 mg/dL (1-2 hours)      Critically ill patients:  140 - 180 mg/dL     Results for MAILYNN, HAFFNER (MRN [id number]) as of 02/20/2015 09:56  Ref. Range 02/20/2015 08:03 02/20/2015 08:44  Glucose-Capillary Latest Ref Range: 65-99 mg/dL 49 (L) 119 (H)     Home DM Meds: Amaryl 4 mg bid       Januvia 100 mg daily       Metformin 1000 mg daily  Current DM Orders: Amaryl 4 mg bid            Tradjenta 5mg  daily            Novolog Sensitive SSI (0-9 units) tid with meals    MD- Patient with Hypoglycemia this AM (Fingerstick glucose 49 mg/dl).  Has had very poor PO intake since admission.  Note patient NPO this AM.  Please consider discontinuation of Amaryl 4 mg bid and Tradjenta 5 mg daily for now and manage with Novolog SSI alone until patient is better able to tolerate PO diet.    Will follow Wyn Quaker RN, MSN, CDE Diabetes Coordinator Inpatient Diabetes Program Team Pager: [phone] (8a-5p)

## 2015-02-20 NOTE — Progress Notes (Signed)
[facility] Physicians - [place] at [facility]   PATIENT NAME: [name]    MR#:  [id number]  DATE OF BIRTH:  08-01-1956  SUBJECTIVE:  Patient denies melena or hematochezia. No dizziness, lightheaded or chest pain. Plan is for EGD and colonoscopy this afternoon. Patient has taken her prep. REVIEW OF SYSTEMS:    Review of Systems  Constitutional: Negative for fever and chills.  Respiratory: Negative for cough and hemoptysis.   Gastrointestinal: Negative for abdominal pain, diarrhea, constipation, blood in stool and melena.  Skin: Negative for rash.  Neurological: Negative for tingling and headaches.   patient sleeping  Tolerating Diet:yes      DRUG ALLERGIES:  No Known Allergies  VITALS:  Blood pressure 121/55, pulse 72, temperature 97.7 F (36.5 C), temperature source Oral, resp. rate 18, height 5\' 8"  (1.727 m), weight 118.899 kg (262 lb 2 oz), SpO2 100 %.  PHYSICAL EXAMINATION:   Physical Exam  Constitutional: She is oriented to person, place, and time and well-developed, well-nourished, and in no distress. No distress.  HENT:  Head: Normocephalic and atraumatic.  Eyes: Pupils are equal, round, and reactive to light. Left eye exhibits no discharge.  Neck: Normal range of motion. Neck supple.  Cardiovascular: Normal rate and regular rhythm.   No murmur heard. Pulmonary/Chest: Effort normal and breath sounds normal. No respiratory distress. She has no wheezes. She has no rales.  Abdominal: Bowel sounds are normal. She exhibits no distension.  Musculoskeletal: Normal range of motion. She exhibits no edema.  Neurological: She is alert and oriented to person, place, and time. No cranial nerve deficit.  Skin: Skin is warm and dry.  Psychiatric: Affect normal.      LABORATORY PANEL:   CBC  Recent Labs Lab 02/19/15 0605 02/20/15 0506  WBC 3.5*  --   HGB 8.0* 7.4*  HCT 24.7*  --   PLT 227  --     ------------------------------------------------------------------------------------------------------------------  Chemistries   Recent Labs Lab 02/18/15 0557  02/20/15 0506  NA 133*  --   --   K 3.7  --   --   CL 99*  --   --   CO2 26  --   --   GLUCOSE 134*  --   --   BUN 44*  --   --   CREATININE 1.80*  < > 1.62*  CALCIUM 7.9*  --   --   < > = values in this interval not displayed. ------------------------------------------------------------------------------------------------------------------  Cardiac Enzymes No results for input(s): TROPONINI in the last 168 hours. ------------------------------------------------------------------------------------------------------------------  RADIOLOGY:  Dg Chest 1 View  02/18/2015    IMPRESSION: 1. Bibasilar atelectasis and small effusions increased mildly from prior. 2. PICC line at the junction of the subclavian vein and SVC.   Electronically Signed   By: Genevive Bi M.D.   On: 02/18/2015 14:55     ASSESSMENT AND PLAN:   59 year old female who was sent emergency department after receiving noticed that her hemoglobin was low.  1. Acute on chronic blood loss anemia: Patient will have an EGD and colonoscopy today Patient has received 2 units of PRBCs.. Her aspirin and Plavix are on hold for now. She will continue on Protonix IV twice a day. Iron studies are consistent with iron deficiency anemia. She is already on iron supplementation. cardiology has seen the patient in consultation. She is able to proceed with colonoscopy and EGD without further cardiac workup. Cardiology did order an echocardiogram but did not recommend that this  hold up her procedure. Her hemoglobin is noted to be 7.4 this morning. She denies any active bleeding. At this time I will not transfuse unless indicated by gastroenterology consultation.   2. Left heel osteomyelitis: Pient will continue on current antibiotics including vancomycin, Levaquin and  doxycycline. Pharmacy is helping to dose his medications.  3. Diabetes: Patient is an ADA diet, sliding scale insulin. Since she is nothing by mouth for procedure this afternoon her blood sugars have been low. I placed her on a D5 normal saline drip at 50 mL an hour.   4. Coronary artery disease with coronary stent: Patient will be continued on outpatient medications. Plavix and aspirin are on hold due to problem #1.  5.Chronic systolic heart failure: Patient is not in acute exacerbation at this time. echocardiogram is pending. I appreciate cardiology consultation.   6. Hyponatremia: improved       CODE STATUS: DO NOT RESUSCITATE  TOTAL TIME TAKING CARE OF THIS PATIENT: 22 minutes.   POSSIBLE D/C in 1 DAY, DEPENDING ON CLINICAL CONDITION.   Zamira Hickam M.D on 02/20/2015 at 12:17 PM  Between 7am to 6pm - Pager - [phone] After 6pm go to [email or web address] - password [email or web address] [facility]  [place] [facility]  Office  [phone]  CC: Primary care physician; Nilda Simmer, MD

## 2015-02-20 NOTE — Progress Notes (Signed)
Patient ID: [name], female   DOB: 03/10/1956, 59 y.o.   MRN: [id number] Pt's O2 sat @ 87% on 2L O2. After discussion with anesthesiology, elected to cancel EGD/colon. Can resume clear liquid diet. Await Echo report. Make sure O2 sat improves before we can schedule EGD/Colon. I am not available tomorrow to do EGD/Colon. I will be at [facility] in [place]. Perhaps, we can schedule on Wed if breathing better. Thanks.

## 2015-02-20 NOTE — Progress Notes (Signed)
Notified MD of pt having constant liquid stools and wounds to sacral area. MD placed order for rectal tube for Bowel prep purposes, to be removed before colonscopy tomorrow.

## 2015-02-21 ENCOUNTER — Encounter: Payer: PPO | Admitting: Surgery

## 2015-02-21 ENCOUNTER — Inpatient Hospital Stay: Payer: PPO

## 2015-02-21 ENCOUNTER — Telehealth: Payer: Self-pay | Admitting: Neurology

## 2015-02-21 DIAGNOSIS — D649 Anemia, unspecified: Secondary | ICD-10-CM

## 2015-02-21 DIAGNOSIS — J9600 Acute respiratory failure, unspecified whether with hypoxia or hypercapnia: Secondary | ICD-10-CM

## 2015-02-21 LAB — BASIC METABOLIC PANEL
ANION GAP: 7 (ref 5–15)
BUN: 24 mg/dL — ABNORMAL HIGH (ref 6–20)
CALCIUM: 7.2 mg/dL — AB (ref 8.9–10.3)
CO2: 29 mmol/L (ref 22–32)
Chloride: 101 mmol/L (ref 101–111)
Creatinine, Ser: 1.42 mg/dL — ABNORMAL HIGH (ref 0.44–1.00)
GFR calc Af Amer: 45 mL/min — ABNORMAL LOW (ref >60)
GFR, EST NON AFRICAN AMERICAN: 39 mL/min — AB (ref >60)
GLUCOSE: 59 mg/dL — AB (ref 65–99)
POTASSIUM: 3 mmol/L — AB (ref 3.5–5.1)
Sodium: 137 mmol/L (ref 135–145)

## 2015-02-21 LAB — CBC
HCT: 24.4 % — ABNORMAL LOW (ref 35.0–47.0)
HEMOGLOBIN: 7.8 g/dL — AB (ref 12.0–16.0)
MCH: 23.2 pg — ABNORMAL LOW (ref 26.0–34.0)
MCHC: 32.1 g/dL (ref 32.0–36.0)
MCV: 72.4 fL — ABNORMAL LOW (ref 80.0–100.0)
PLATELETS: 214 10*3/uL (ref 150–440)
RBC: 3.37 MIL/uL — ABNORMAL LOW (ref 3.80–5.20)
RDW: 20 % — ABNORMAL HIGH (ref 11.5–14.5)
WBC: 3.1 10*3/uL — AB (ref 3.6–11.0)

## 2015-02-21 LAB — URINALYSIS COMPLETE WITH MICROSCOPIC (ARMC ONLY)
BILIRUBIN URINE: NEGATIVE
Glucose, UA: NEGATIVE mg/dL
Ketones, ur: NEGATIVE mg/dL
Nitrite: NEGATIVE
PH: 5 (ref 5.0–8.0)
Protein, ur: NEGATIVE mg/dL
Specific Gravity, Urine: 1.013 (ref 1.005–1.030)

## 2015-02-21 LAB — GLUCOSE, CAPILLARY
GLUCOSE-CAPILLARY: 104 mg/dL — AB (ref 65–99)
GLUCOSE-CAPILLARY: 155 mg/dL — AB (ref 65–99)
Glucose-Capillary: 161 mg/dL — ABNORMAL HIGH (ref 65–99)
Glucose-Capillary: 252 mg/dL — ABNORMAL HIGH (ref 65–99)

## 2015-02-21 LAB — VANCOMYCIN, TROUGH: Vancomycin Tr: 17 ug/mL (ref 10–20)

## 2015-02-21 MED ORDER — SODIUM CHLORIDE 0.9 % IV SOLN: INTRAVENOUS | Status: DC

## 2015-02-21 MED ORDER — LEVOFLOXACIN 500 MG PO TABS
500.0000 mg | ORAL_TABLET | Freq: Every day | ORAL | Status: DC
  Administered 2015-02-22 – 2015-02-24 (×3): 500 mg via ORAL
  Filled 2015-02-21 (×4): qty 1

## 2015-02-21 MED ORDER — LEVOFLOXACIN 250 MG PO TABS
250.0000 mg | ORAL_TABLET | Freq: Once | ORAL | Status: AC
  Administered 2015-02-21: 250 mg via ORAL
  Filled 2015-02-21: qty 1

## 2015-02-21 MED ORDER — BISACODYL 5 MG PO TBEC
10.0000 mg | DELAYED_RELEASE_TABLET | Freq: Once | ORAL | Status: AC
  Administered 2015-02-21: 10 mg via ORAL
  Filled 2015-02-21: qty 2

## 2015-02-21 MED ORDER — AMIODARONE HCL 200 MG PO TABS
400.0000 mg | ORAL_TABLET | Freq: Two times a day (BID) | ORAL | Status: DC
  Administered 2015-02-23 – 2015-02-24 (×3): 400 mg via ORAL
  Filled 2015-02-21 (×4): qty 2

## 2015-02-21 MED ORDER — POTASSIUM CHLORIDE 20 MEQ PO PACK
20.0000 meq | PACK | Freq: Two times a day (BID) | ORAL | Status: DC
  Administered 2015-02-21 – 2015-02-24 (×6): 20 meq via ORAL
  Filled 2015-02-21 (×7): qty 1

## 2015-02-21 MED ORDER — BOOST / RESOURCE BREEZE PO LIQD
1.0000 | Freq: Three times a day (TID) | ORAL | Status: DC
  Administered 2015-02-21 – 2015-02-24 (×7): 1 via ORAL

## 2015-02-21 MED ORDER — AMIODARONE HCL 200 MG PO TABS
400.0000 mg | ORAL_TABLET | Freq: Three times a day (TID) | ORAL | Status: DC
Start: 2015-02-21 — End: 2015-02-22
  Administered 2015-02-21: 400 mg via ORAL
  Filled 2015-02-21 (×2): qty 2

## 2015-02-21 NOTE — Progress Notes (Addendum)
Patient: [name] / Admit Date: 02/17/2015 / Date of Encounter: 02/21/2015, 1:59 PM   Subjective: She remains on 3L O2 via nasal cannula (not on home O2), with sats at 99%. On room air sats drop to 87%. Echo showed EF 50-55%, no RWMA, GR1DD, mild MR, normal PASP. Hgb 7.8 this morning, prior 7.4. Unable to move forward with EGC/colonoscopy 2/2 SOB to date. She had several episodes of non-paced ventricular beats/NSVT overnight and early this morning, as well as SVT. On Coreg 25 mg bid.   Review of Systems: Review of Systems  Constitutional: Positive for fever and malaise/fatigue. Negative for chills, weight loss and diaphoresis.       T max 99.5 this afternoon  Eyes: Negative for discharge and redness.  Respiratory: Positive for shortness of breath. Negative for cough, hemoptysis, sputum production and wheezing.   Cardiovascular: Positive for palpitations. Negative for chest pain, orthopnea, leg swelling and PND.  Gastrointestinal: Negative for heartburn, nausea, vomiting, abdominal pain, diarrhea, constipation and blood in stool.       Possible melena  Musculoskeletal: Positive for falls. Negative for myalgias.  Skin: Negative for itching and rash.  Neurological: Positive for weakness. Negative for dizziness, sensory change, speech change, focal weakness and loss of consciousness.  Psychiatric/Behavioral: The patient is not nervous/anxious.   All other systems were reviewed and found to be negative.   Objective: Telemetry: V-paced, 117, occasional episodes of non-paced ventricular beats/NSVT overnight and this morning, SVT Physical Exam: Blood pressure 106/57, pulse 116, temperature 99.5 F (37.5 C), temperature source Oral, resp. rate 19, height 5\' 8"  (1.727 m), weight 262 lb 2 oz (118.899 kg), SpO2 99 %. Body mass index is 39.87 kg/(m^2). General: Well developed, well nourished, in no acute distress. Head: Normocephalic, atraumatic, sclera non-icteric, no xanthomas, nares are  without discharge. Pale conjunctiva.  Neck: Negative for carotid bruits. JVP not elevated. Lungs: Clear bilaterally to auscultation without wheezes, rales, or rhonchi. Breathing is unlabored. Heart: Tachycardia. S1 S2. 2/6 flow murmur. No rubs, or gallops.  Abdomen: Obese. soft, non-tender, non-distended with normoactive bowel sounds. No rebound/guarding. Extremities: No clubbing or cyanosis. No edema. Distal pedal pulses are 2+ and equal bilaterally. Neuro: Alert and oriented X 3. Moves all extremities spontaneously. Psych:  Responds to questions appropriately with a normal affect.   Intake/Output Summary (Last 24 hours) at 02/21/15 1359 Last data filed at 02/21/15 1224  Gross per 24 hour  Intake   1754 ml  Output   1300 ml  Net    454 ml    Inpatient Medications:  . carvedilol  25 mg Oral BID WC  . doxycycline  100 mg Oral BID  . feeding supplement (RESOURCE BREEZE)  1 Container Oral TID WC  . ferrous sulfate  325 mg Oral BID WC  . gabapentin  600 mg Oral TID  . hydrALAZINE  50 mg Oral TID  . insulin aspart  0-9 Units Subcutaneous TID WC  . isosorbide mononitrate  60 mg Oral Daily  . levofloxacin  500 mg Oral Daily  . nortriptyline  50 mg Oral QHS  . nystatin   Topical TID  . pantoprazole (PROTONIX) IV  40 mg Intravenous Q12H  . potassium chloride  20 mEq Oral BID  . pravastatin  40 mg Oral QHS  . torsemide  40 mg Oral Daily  . vancomycin  1,500 mg Intravenous Q36H   Infusions:    Labs:  Recent Labs  02/20/15 0506 02/21/15 0415  NA  --  137  K  --  3.0*  CL  --  101  CO2  --  29  GLUCOSE  --  59*  BUN  --  24*  CREATININE 1.62* 1.42*  CALCIUM  --  7.2*   No results for input(s): AST, ALT, ALKPHOS, BILITOT, PROT, ALBUMIN in the last 72 hours.  Recent Labs  02/19/15 0605 02/20/15 0506 02/21/15 0415  WBC 3.5*  --  3.1*  HGB 8.0* 7.4* 7.8*  HCT 24.7*  --  24.4*  MCV 72.2*  --  72.4*  PLT 227  --  214   No results for input(s): CKTOTAL, CKMB, TROPONINI  in the last 72 hours. Invalid input(s): POCBNP No results for input(s): HGBA1C in the last 72 hours.   Weights: Filed Weights   02/17/15 1032 02/17/15 1307  Weight: 368 lb (166.924 kg) 262 lb 2 oz (118.899 kg)     Radiology/Studies:  Dg Chest 1 View  02/18/2015   CLINICAL DATA:  Fever and anemia.  EXAM: CHEST  1 VIEW  COMPARISON:  Radiograph 01/16/2015  FINDINGS: Interval placement of a right PICC line with tip at the junction of the right subclavian vein and superior vena cava. Left-sided pacemaker with continuous leads overlies normal cardiac silhouette. There is bibasilar atelectasis and small effusions mildly increased.  IMPRESSION: 1. Bibasilar atelectasis and small effusions increased mildly from prior. 2. PICC line at the junction of the subclavian vein and SVC.   Electronically Signed   By: Suzy Bouchard M.D.   On: 02/18/2015 14:55   Dg Chest 2 View  02/21/2015   CLINICAL DATA:  59 year old female with a history of shortness of breath.  EXAM: CHEST - 2 VIEW  COMPARISON:  02/18/2015, 01/16/2015, 11/01/2014  FINDINGS: Cardiomediastinal silhouette unchanged in size and contour. Atherosclerotic calcifications of the aortic arch.  Low lung volumes accentuates the interstitium.  Persistent bilateral interstitial and airspace opacities with fluid in the minor fissure.  No pneumothorax.  Unchanged left-sided cardiac pacing device.  Blunting of the right costophrenic angle.  Unchanged position of right upper extremity PICC, terminating in the superior vena cava.  No displaced fracture.  Unremarkable appearance of the upper abdomen.  IMPRESSION: Low lung volumes with evidence of persisting edema and small right pleural effusion.  Unchanged position of right upper extremity PICC.  Signed,  Dulcy Fanny. Earleen Newport, DO  [facility]  [facility]   Electronically Signed   By: Corrie Mckusick D.O.   On: 02/21/2015 08:56   Nm Bone 3 Phase Ltd  01/27/2015   CLINICAL  DATA:  Chronic open wound of left foot for 6 weeks. Infection, swelling and diabetes.  EXAM: NUCLEAR MEDICINE 3-PHASE BONE SCAN  TECHNIQUE: Radionuclide angiographic images, immediate static blood pool images, and 3-hour delayed static images were obtained of the feet after intravenous injection of radiopharmaceutical.  RADIOPHARMACEUTICALS:  23.49 Technetium 99 MDP  COMPARISON:  None.  FINDINGS: Vascular phase: There is slight asymmetric increased perfusion to the left hindfoot on the early vascular phase.  Blood pool phase: Asymmetric increased uptake localizing to the left hindfoot. Increased blood pool activity localizing to the second or third left phalanx also noted.  Delayed phase: Increased radiotracer uptake localizing to the midfoot and hindfoot regions of both feet noted. Increased delayed phase uptake localizes to the left second phalanx.  IMPRESSION: 1. There is increased uptake on all 3 phases localizing to the left hindfoot region. This may be a manifestation of osteomyelitis. Correlation with exact site  of nonhealing ulcer. 2. There is increased uptake localizing to the second phalanx of the left foot on the blood pool and delayed phase images. 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 Underlying infection not excluded.   Electronically Signed   By: Kerby Moors M.D.   On: 01/27/2015 08:11     Assessment and Plan  1. Acute respiratory failure: -Requiring 3L O2 via nasal cannula to maintain O2 sats  -No signs of acute on chronic CHF on echo  -On incentive spirometry and torsemide  -Anemia possibly playing a role  -Wean O2 as tolerated  2. Acute on chronic anemia: -Likely 2/2 blood loss -GI planning for EGD and colonoscopy when stable from breathing standpoint -It is likely that some of her SOB symptoms are 2/2 her low hgb -From a cardiac standpoint echo did not show any acute findings that would preclude GI evaluation of her acute on chronic  anemia  -Given arrhythmia may want to monitor for stability with the addition of amiodarone prior to moving forward with procedure   3. Ventricular arrhythmia: -Several episodes of non-paced ventricular rhythm/NSVT overnight and this morning, as well as SVT with rare pacer spikes seen intermittently at times and varying QRS dimensions  -She is status post MDT CRT-D -On Coreg 25 mg bid -Add amiodarone 400 mg tid, loading dose today, followed by 400 mg bid x 1 week, 200 mg bid x 1 week, 200 mg daily  4. Ischemic cardiomyopathy: -Prior EF 04/2014 of 30% -Echo 02/2015 showed EF 50-55%, no RWMA, GR1DD, mild MR, normal PASP -Continue Coreg as above  5. CAD: -Status post stenting of occluded LAD in 2013, known chronically occluded PDA -No symptoms of angina    -Continue current medications  5. Diabetes: -On SSI -Per IM  6. Left heel osteomyelitis: -On doxycycline and vancomycin per IM  7. OSA on CPAP   Signed, Christell Faith, PA-C Pager: [phone] 02/21/2015, 1:59 PM    Patient seen and examined, agree with detailed note above,  Patient presentation and plan discussed on rounds.  EF improved on echo, no elevated RVSP. Leg edema likely from lymphedema and needs outpt lymphedema compression boots Anemia (obesity and deconditioning )  likely contributing to SOB, stable on oxygen.  Esmond Plants  M.D., Ph.D.

## 2015-02-21 NOTE — Telephone Encounter (Signed)
-----   Message from Vassie Moselle sent at 02/21/2015  2:37 PM EDT ----- Regarding: RE: EMG Pt states that she can not move the appt  ----- Message -----    From: Alda Berthold, DO    Sent: 02/20/2015   4:52 PM      To: Vassie Moselle Subject: EMG                                            Can we move her EMG to this Wednesday at 11am?  Not sure why she got moved to July.  Thanks,  [employer]

## 2015-02-21 NOTE — Progress Notes (Signed)
On-call MD, Dr. Jannifer Franklin notified of run of SVT, converted back to pace rhythm; pt. Asymptomatic; paced rate of 116;

## 2015-02-21 NOTE — Telephone Encounter (Signed)
Attempted to reschedule EMG to a sooner date (5/18), but patient unable to come in.  Chet Greenley K. Posey Pronto, DO

## 2015-02-21 NOTE — Progress Notes (Signed)
Initial Nutrition Assessment  DOCUMENTATION CODES:     INTERVENTION: Medical Food Supplement Therapy: will recommend Resource Breeze po TID, each supplement provides 250 kcal and 9 grams of protein for wound healing promotion and added nutrition as pt remains on CL  Boost Breeze  NUTRITION DIAGNOSIS:  Inadequate oral intake related to inability to eat, altered GI function, wound healing as evidenced by estimated needs.  GOAL:  Other (Comment) (Goal for diet advancement as medically able within 5-7 days)  MONITOR:  Other (Comment), Diet advancement, Skin, I & O's (Digestive System, Energy Intake, AnemiaProfile)  REASON FOR ASSESSMENT:  Other (Comment) (RD Screen: Pressure Ulcers)    ASSESSMENT:  Pt admitted with low hemoglobin and melena. Pt scheduled for EGD/colonoscopy likely tomorrow. Pt with multiple pressure ulcers/wounds. PMHx:  Past Medical History  Diagnosis Date  . Obesity, unspecified   . Unspecified vitamin D deficiency   . Pure hypercholesterolemia   . Neuropathy 2011  . Chicken pox   . Coronary artery disease   . CHF (congestive heart failure) 07/19/2012    EF 15% by cardiac catheterization.  Admission.  . Hypertension   . Anemia   . Automatic implantable cardioverter-defibrillator in situ   . Type II diabetes mellitus   . Myocardial infarct 07/19/12    s/p cardiac catheterization with stenting to LAD.  Marland Kitchen History of blood transfusion ~ 2011    "plasma; had neuropathy; couldn't walk"  . OSA on CPAP     Moderate with AHI 23/hr and now on CPAP at 16cm H2O.  Her DME is [facility]   PO Intake: pt reports eating broth and jello this am with juice. Pt reports tolerating CL since admission. Pt reports PTA eating well usually 2 meals per day; no decrease in appetite.  Medications: Ferrous sulfate, Novolog, Protonix, KCl, Demadex Labs: Glucose Profile:  Recent Labs  02/20/15 2125 02/21/15 0817 02/21/15 1203  GLUCAP 108* 104* 155*   Electrolyte and Renal  Profile:    Recent Labs Lab 02/17/15 1059 02/18/15 0557 02/19/15 0605 02/20/15 0506 02/21/15 0415  BUN 49* 44*  --   --  24*  CREATININE 2.06* 1.80* 1.71* 1.62* 1.42*  NA 131* 133*  --   --  137  K 3.8 3.7  --   --  3.0*   Protein Profile: No results for input(s): ALBUMIN in the last 168 hours.  Anemia Profile: CBC Latest Ref Rng 02/21/2015 02/20/2015 02/19/2015  WBC 3.6 - 11.0 K/uL 3.1(L) - 3.5(L)  Hemoglobin 12.0 - 16.0 g/dL 7.8(L) 7.4(L) 8.0(L)  Hematocrit 35.0 - 47.0 % 24.4(L) - 24.7(L)  Platelets 150 - 440 K/uL 214 - 227    Pt reports weight gain PTA. Pt reports UBW of 250lbs  Height:  Ht Readings from Last 1 Encounters:  02/17/15 5\' 8"  (1.727 m)    Weight:  Wt Readings from Last 1 Encounters:  02/17/15 262 lb 2 oz (118.899 kg)    Ideal Body Weight:  63.6kg  Wt Readings from Last 10 Encounters:  02/17/15 262 lb 2 oz (118.899 kg)  01/27/15 262 lb 7 oz (119.041 kg)  01/16/15 256 lb 9.6 oz (116.393 kg)  01/06/15 260 lb 6.4 oz (118.117 kg)  12/30/14 259 lb (117.482 kg)  12/22/14 258 lb (117.028 kg)  12/15/14 258 lb 8 oz (117.255 kg)  11/08/14 258 lb 12.8 oz (117.391 kg)  10/25/14 259 lb 9.6 oz (117.754 kg)  08/16/14 255 lb (115.667 kg)    BMI:  Body mass index is 39.87 kg/(m^2).  Nutrition-Focused physical exam completed. Findings are WDL for fat depletion and muscle depletion. RD noted per Nsg 2+ and 3+ edema.  Estimated Nutritional Needs:  Kcal:  1607-1900kcals, BEE: 1218kcals, TEE: (IF 1.1-1.3)(AF 1.2) using IBw of 63.6kg  Protein:  64-76g protein (1.0-1.2g/kg) using IBW of 63.6kg  Fluid:  1590-1923mL of fluid (25-51mL/kg) using IBW of 63.6kg  Skin:  Multiple pressure ulcers noted, including stage II and III to coccyx  Diet Order:  Diet clear liquid Room service appropriate?: Yes; Fluid consistency:: Thin  EDUCATION NEEDS:  No education needs identified at this time   Intake/Output Summary (Last 24 hours) at 02/21/15 1306 Last data filed at  02/21/15 1224  Gross per 24 hour  Intake   1754 ml  Output   1300 ml  Net    454 ml    Last BM:  5/16   MODERATE Care Level  Dwyane Luo, RD, LDN Pager [phone]

## 2015-02-21 NOTE — Progress Notes (Signed)
Inpatient Diabetes Program Recommendations  AACE/ADA: New Consensus Statement on Inpatient Glycemic Control (2013)  Target Ranges:  Prepandial:   less than 140 mg/dL      Peak postprandial:   less than 180 mg/dL (1-2 hours)      Critically ill patients:  140 - 180 mg/dL   Results for [name], [name] (MRN [id number]) as of 02/21/2015 07:38  Ref. Range 02/20/2015 10:51 02/20/2015 11:36 02/20/2015 17:35 02/20/2015 18:18 02/20/2015 21:25  Glucose-Capillary Latest Ref Range: 65-99 mg/dL 69 65 49 (L) 84 108 (H)    Home DM Meds: Amaryl 4 mg bid  Januvia 100 mg daily  Metformin 1000 mg daily  Current DM Orders: Amaryl 4 mg bid  Tradjenta 5mg  daily  Novolog Sensitive SSI (0-9 units) tid with meals  Patient with hypoglycemia  X 4 yesterday. Has had very poor PO intake since admission.Anticipate NPO tomorrow for procedure.  Please consider discontinuation of Amaryl 4 mg bid and Tradjenta 5 mg daily for now and manage with Novolog SSI alone until patient is better able to tolerate PO diet.  Gentry Fitz, RN, BA, MHA, CDE Diabetes Coordinator Inpatient Diabetes Program  [phone] (Team Pager) [phone] ([place]) 02/21/2015 7:40 AM

## 2015-02-21 NOTE — Progress Notes (Signed)
ANTIBIOTIC CONSULT NOTE - INITIAL  Pharmacy Consult for Antibiotic Dosing - Vancomycin/Levaquin/Doxycycline Indication: Osteomyelitis  No Known Allergies  Patient Measurements: Height: 5\' 8"  (172.7 cm) Weight: 262 lb 2 oz (118.899 kg) IBW/kg (Calculated) : 63.9 Adjusted Body Weight: 86kg  Vital Signs: Temp: 98.5 F (36.9 C) (05/16 2310) Temp Source: Oral (05/16 2310) BP: 120/64 mmHg (05/16 2310) Pulse Rate: 115 (05/16 2310) Intake/Output from previous day: 05/16 0701 - 05/17 0700 In: 1162 [P.O.:420; I.V.:742] Out: 1100 [Urine:1100]   Labs:  Recent Labs  02/18/15 0557 02/19/15 0605 02/20/15 0506  WBC 3.6 3.5*  --   HGB 7.4* 8.0* 7.4*  PLT 255 227  --   CREATININE 1.80* 1.71* 1.62*   Estimated Creatinine Clearance: 49.5 mL/min (by C-G formula based on Cr of 1.62).  Recent Labs  02/18/15 0453 02/19/15 0612 02/20/15 2325  VANCOTROUGH  --   --  17  VANCORANDOM 23 17  --      Microbiology: No results found for this or any previous visit (from the past 720 hour(s)).  Medical History: Past Medical History  Diagnosis Date  . Obesity, unspecified   . Unspecified vitamin D deficiency   . Pure hypercholesterolemia   . Neuropathy 2011  . Chicken pox   . Coronary artery disease   . CHF (congestive heart failure) 07/19/2012    EF 15% by cardiac catheterization.  Admission.  . Hypertension   . Anemia   . Automatic implantable cardioverter-defibrillator in situ   . Type II diabetes mellitus   . Myocardial infarct 07/19/12    s/p cardiac catheterization with stenting to LAD.  Marland Kitchen History of blood transfusion ~ 2011    "plasma; had neuropathy; couldn't walk"  . OSA on CPAP     Moderate with AHI 23/hr and now on CPAP at 16cm H2O.  Her DME is [facility]    Medications:  Scheduled:  . carvedilol  25 mg Oral BID WC  . doxycycline  100 mg Oral BID  . ferrous sulfate  325 mg Oral BID WC  . gabapentin  600 mg Oral TID  . glimepiride  4 mg Oral BID AC  . hydrALAZINE  50 mg  Oral TID  . insulin aspart  0-9 Units Subcutaneous TID WC  . isosorbide mononitrate  60 mg Oral Daily  . levofloxacin  250 mg Oral Daily  . linagliptin  5 mg Oral Daily  . nortriptyline  50 mg Oral QHS  . nystatin   Topical TID  . pantoprazole (PROTONIX) IV  40 mg Intravenous Q12H  . pravastatin  40 mg Oral QHS  . torsemide  40 mg Oral Daily  . vancomycin  1,500 mg Intravenous Q36H   Assessment: Pharmacy consulted to continue vancomycin regimen from outpatient. Patient is seen by Dr Ola Spurr as outpatient for Osteomyelitis. Patient has CHF, and unknown baseline renal function. Was ordered vancomycin 1500mg  IV Q24H as an outpatient. Unclear when last dose of vancomycin was.    Creatinine trending down, vancomycin level therapeutic today.   Goal of Therapy:  Vancomycin trough level 15-20 mcg/ml  Plan:   Will resume vancomycin dosing today. Ordered vancomycin 1500mg  IV Q36H. Will check trough prior to second dose to assure patient is clearing drug.  Pt on levaquin 500mg  daily as outpatient, dose adjusted for renal function at this time to levaquin 500mg  PO x 1, then 250mg  daily. Will continue to follow renal function and adjust as indicated.  Continue doxycycline 100mg  PO Q12H   Pharmacy to follow per  consult  Rexene Edison, PharmD Clinical Pharmacist 02/21/2015 12:06 AM   5/16 PM vancomycin trough 17. Continue current regimen.

## 2015-02-21 NOTE — Progress Notes (Signed)
Paged and spoke with Dr. Benjie Karvonen regarding obtaining a consult to the wound care team for patient's multiple wounds.

## 2015-02-21 NOTE — Progress Notes (Signed)
[facility] - [place] [facility]   PATIENT NAME: [name]    MR#:  [id number]  DATE OF BIRTH:  Jul 14, 1956  SUBJECTIVE:  Patient cannot obtain EGD due to hypoxia. She was noted be 87% on room air. Patient denies any dark-colored stools patient. Patient denies shortness of breath or cough. REVIEW OF SYSTEMS:    Review of Systems  Constitutional: Negative for fever and chills.  Respiratory: Negative for cough, hemoptysis and shortness of breath.   Cardiovascular: Negative for chest pain, palpitations and orthopnea.  Gastrointestinal: Negative for heartburn, nausea, abdominal pain, diarrhea and constipation.  Skin: Negative for rash.  Neurological: Negative for headaches.  Psychiatric/Behavioral: Negative for depression.   patient sleeping  Tolerating Diet:yes      DRUG ALLERGIES:  No Known Allergies  VITALS:  Blood pressure 106/57, pulse 116, temperature 99.5 F (37.5 C), temperature source Oral, resp. rate 19, height 5\' 8"  (1.727 m), weight 118.899 kg (262 lb 2 oz), SpO2 99 %.  PHYSICAL EXAMINATION:   Physical Exam  Constitutional: She is oriented to person, place, and time and well-developed, well-nourished, and in no distress. No distress.  HENT:  Head: Normocephalic and atraumatic.  Eyes: Pupils are equal, round, and reactive to light. Left eye exhibits no discharge.  Neck: Normal range of motion. Neck supple.  Cardiovascular: Normal rate and regular rhythm.   No murmur heard. Pulmonary/Chest: Effort normal and breath sounds normal. No respiratory distress. She has no wheezes. She has no rales.  Abdominal: Bowel sounds are normal. She exhibits no distension.  Musculoskeletal: Normal range of motion. She exhibits no edema.  Neurological: She is alert and oriented to person, place, and time. No cranial nerve deficit.  Skin: Skin is warm and dry.  Psychiatric: Affect normal.      LABORATORY PANEL:   CBC  Recent Labs Lab  02/21/15 0415  WBC 3.1*  HGB 7.8*  HCT 24.4*  PLT 214   ------------------------------------------------------------------------------------------------------------------  Chemistries   Recent Labs Lab 02/21/15 0415  NA 137  K 3.0*  CL 101  CO2 29  GLUCOSE 59*  BUN 24*  CREATININE 1.42*  CALCIUM 7.2*   ------------------------------------------------------------------------------------------------------------------  Cardiac Enzymes No results for input(s): TROPONINI in the last 168 hours. ------------------------------------------------------------------------------------------------------------------  RADIOLOGY:  Dg Chest 1 View  02/18/2015    IMPRESSION: 1. Bibasilar atelectasis and small effusions increased mildly from prior. 2. PICC line at the junction of the subclavian vein and SVC.   Electronically Signed   By: Genevive Bi M.D.   On: 02/18/2015 14:55     ASSESSMENT AND PLAN:   59 year old female who was sent emergency department after receiving noticed that her hemoglobin was low.  1. Acute on chronic blood loss anemia: patient was supposed to have an EGD and colonoscopy, however due to hypoxia this was placed on hold. Plan is for EGD and colonoscopy tomorrow if she is no longer hypoxic.Patient has received 2 units of PRBCs.. Her aspirin and Plavix are on hold for now. She will continue on Protonix IV twice a day. Iron studies are consistent with iron deficiency anemia. She is already on iron supplementation. cardiology has seen the patient in consultation. She denies any active bleeding. At this time I will not transfuse unless indicated by gastroenterology consultation.   2. Left heel osteomyelitis: Pient will continue on current antibiotics including vancomycin, Levaquin and doxycycline. Pharmacy is helping to dose his medications.  3. Diabetes: Patient is an ADA diet, sliding scale insulin. 4. Coronary  artery disease with coronary stent: Patient will be  continued on outpatient medications. Plavix and aspirin are on hold due to problem #1.  5.Chronic systolic heart failure: Patient is not in acute exacerbation at this time, hour over she wasn't hypoxic. Her chest x-ray shows a small effusion and atelectasis. I have also ordered incentive spirometry. Echocardiogram is pending. I appreciate cardiology consultation.   6. Hyponatremia: improved  7. Hypoxia: It does not appear that patient has acute exacerbation of CHF. Her chest x-ray is as stated above. I have started incentive spirometry and she will continue on torsemide. We will continue to monitor oxygen. Hopefully she'll be able to be weaned off the oxygen and able to have her GI procedures tomorrow.        CODE STATUS: DO NOT RESUSCITATE  TOTAL TIME TAKING CARE OF THIS PATIENT: 25 minutes.   POSSIBLE D/C in 1 DAY, DEPENDING ON CLINICAL CONDITION.   Margaruite Top M.D on 02/21/2015 at 1:14 PM  Between 7am to 6pm - Pager - [phone] After 6pm go to [email or web address] - password [email or web address] [facility]  [place] [facility]  Office  [phone]  CC: Primary care physician; Nilda Simmer, MD

## 2015-02-22 ENCOUNTER — Encounter: Payer: Self-pay | Admitting: Physician Assistant

## 2015-02-22 ENCOUNTER — Encounter: Payer: PPO | Admitting: Surgery

## 2015-02-22 ENCOUNTER — Encounter: Payer: PPO | Admitting: Neurology

## 2015-02-22 ENCOUNTER — Encounter
Admission: EM | Disposition: A | Discharge status: Home Health | Payer: Self-pay | Source: Home / Self Care | Attending: Internal Medicine

## 2015-02-22 ENCOUNTER — Inpatient Hospital Stay: Payer: PPO | Admitting: Certified Registered"

## 2015-02-22 DIAGNOSIS — I472 Ventricular tachycardia: Secondary | ICD-10-CM

## 2015-02-22 DIAGNOSIS — D649 Anemia, unspecified: Secondary | ICD-10-CM

## 2015-02-22 DIAGNOSIS — I255 Ischemic cardiomyopathy: Secondary | ICD-10-CM

## 2015-02-22 DIAGNOSIS — J9600 Acute respiratory failure, unspecified whether with hypoxia or hypercapnia: Secondary | ICD-10-CM

## 2015-02-22 HISTORY — PX: ESOPHAGOGASTRODUODENOSCOPY: SHX5428

## 2015-02-22 HISTORY — PX: COLONOSCOPY WITH PROPOFOL: SHX5780

## 2015-02-22 LAB — GLUCOSE, CAPILLARY
GLUCOSE-CAPILLARY: 91 mg/dL (ref 65–99)
Glucose-Capillary: 93 mg/dL (ref 65–99)
Glucose-Capillary: 82 mg/dL (ref 65–99)
Glucose-Capillary: 235 mg/dL — ABNORMAL HIGH (ref 65–99)

## 2015-02-22 LAB — BASIC METABOLIC PANEL
Anion gap: 8 (ref 5–15)
BUN: 23 mg/dL — AB (ref 6–20)
CHLORIDE: 101 mmol/L (ref 101–111)
CO2: 28 mmol/L (ref 22–32)
CREATININE: 1.51 mg/dL — AB (ref 0.44–1.00)
Calcium: 7.2 mg/dL — ABNORMAL LOW (ref 8.9–10.3)
GFR calc non Af Amer: 36 mL/min — ABNORMAL LOW (ref >60)
GFR, EST AFRICAN AMERICAN: 42 mL/min — AB (ref >60)
Glucose, Bld: 132 mg/dL — ABNORMAL HIGH (ref 65–99)
Potassium: 3.1 mmol/L — ABNORMAL LOW (ref 3.5–5.1)
SODIUM: 137 mmol/L (ref 135–145)

## 2015-02-22 LAB — HEMOGLOBIN: Hemoglobin: 7.3 g/dL — ABNORMAL LOW (ref 12.0–16.0)

## 2015-02-22 LAB — MAGNESIUM: MAGNESIUM: 1.3 mg/dL — AB (ref 1.7–2.4)

## 2015-02-22 LAB — HM COLONOSCOPY

## 2015-02-22 LAB — TSH: TSH: 3.699 u[IU]/mL (ref 0.350–4.500)

## 2015-02-22 SURGERY — EGD (ESOPHAGOGASTRODUODENOSCOPY)
Anesthesia: Monitor Anesthesia Care | Laterality: Left

## 2015-02-22 SURGERY — COLONOSCOPY WITH PROPOFOL
Anesthesia: Monitor Anesthesia Care

## 2015-02-22 MED ORDER — SODIUM CHLORIDE 0.9 % IV SOLN
INTRAVENOUS | Status: DC
  Administered 2015-02-22 (×2): via INTRAVENOUS

## 2015-02-22 MED ORDER — MAGNESIUM SULFATE 2 GM/50ML IV SOLN
2.0000 g | Freq: Once | INTRAVENOUS | Status: AC
  Administered 2015-02-22: 2 g via INTRAVENOUS
  Filled 2015-02-22: qty 50

## 2015-02-22 MED ORDER — COLLAGENASE 250 UNIT/GM EX OINT
TOPICAL_OINTMENT | Freq: Every day | CUTANEOUS | Status: DC
  Administered 2015-02-22 – 2015-02-24 (×3): via TOPICAL
  Filled 2015-02-22: qty 30

## 2015-02-22 MED ORDER — PHENYLEPHRINE HCL 10 MG/ML IJ SOLN
INTRAMUSCULAR | Status: DC | PRN
  Administered 2015-02-22: 200 ug via INTRAVENOUS
  Administered 2015-02-22: 100 ug via INTRAVENOUS
  Administered 2015-02-22: 200 ug via INTRAVENOUS

## 2015-02-22 MED ORDER — PROPOFOL INFUSION 10 MG/ML OPTIME
INTRAVENOUS | Status: DC | PRN
  Administered 2015-02-22: 50 ug/kg/min via INTRAVENOUS

## 2015-02-22 MED ORDER — LIDOCAINE HCL (CARDIAC) 20 MG/ML IV SOLN
INTRAVENOUS | Status: DC | PRN
  Administered 2015-02-22: 60 mg via INTRAVENOUS

## 2015-02-22 MED ORDER — SODIUM CHLORIDE 0.9 % IV SOLN
INTRAVENOUS | Status: DC
  Administered 2015-02-22: 16:00:00 via INTRAVENOUS

## 2015-02-22 MED ORDER — MIDAZOLAM HCL 2 MG/2ML IJ SOLN
INTRAMUSCULAR | Status: DC | PRN
  Administered 2015-02-22: 2 mg via INTRAVENOUS

## 2015-02-22 MED ORDER — POTASSIUM CHLORIDE CRYS ER 20 MEQ PO TBCR
60.0000 meq | EXTENDED_RELEASE_TABLET | ORAL | Status: AC
  Administered 2015-02-22: 60 meq via ORAL
  Filled 2015-02-22: qty 3

## 2015-02-22 MED ORDER — PROPOFOL 10 MG/ML IV BOLUS
INTRAVENOUS | Status: DC | PRN
  Administered 2015-02-22: 20 mg via INTRAVENOUS

## 2015-02-22 MED ORDER — EPHEDRINE SULFATE 50 MG/ML IJ SOLN
INTRAMUSCULAR | Status: DC | PRN
  Administered 2015-02-22: 10 mg via INTRAVENOUS
  Administered 2015-02-22 (×2): 5 mg via INTRAVENOUS

## 2015-02-22 NOTE — Op Note (Signed)
Contrary to nurse, prep was quite poor. 2 polyps in cecum and ascending colon removed. Left side of colon could not be visualized well. No active bleeding. EGD was normal. Stool heme negative. Will resume regular diet. Resume Fe daily. Would repeat colon in 1 year with better bowel prep. Will sign off. Thanks.

## 2015-02-22 NOTE — Anesthesia Postprocedure Evaluation (Signed)
  Anesthesia Post-op Note  Patient: [name]  Procedure(s) Performed: Procedure(s): COLONOSCOPY WITH PROPOFOL (Left) ESOPHAGOGASTRODUODENOSCOPY (EGD) (N/A)  Anesthesia type:MAC, General  Patient location: PACU  Post pain: Pain level controlled  Post assessment: Post-op Vital signs reviewed, Patient's Cardiovascular Status Stable, Respiratory Function Stable, Patent Airway and No signs of Nausea or vomiting  Post vital signs: Reviewed and stable  Last Vitals:  Filed Vitals:   02/22/15 1555  BP: 108/51  Pulse: 66  Temp: 36.4 C  Resp: 16    Level of consciousness: awake, alert  and patient cooperative  Complications: No apparent anesthesia complications

## 2015-02-22 NOTE — Anesthesia Preprocedure Evaluation (Addendum)
Anesthesia Evaluation  Patient identified by MRN, date of birth, ID band Patient awake    Reviewed: Allergy & Precautions, NPO status , Patient's Chart, lab work & pertinent test results  Airway Mallampati: IV  TM Distance: >3 FB Neck ROM: Full  Mouth opening: Limited Mouth Opening  Dental  (+) Teeth Intact   Pulmonary sleep apnea and Continuous Positive Airway Pressure Ventilation ,  breath sounds clear to auscultation        Cardiovascular Exercise Tolerance: Poor hypertension, Pt. on medications + CAD, + Past MI, + Cardiac Stents and +CHF + dysrhythmias Ventricular Tachycardia + Cardiac Defibrillator IVRhythm:Regular Rate:Normal     Neuro/Psych  Neuromuscular disease    GI/Hepatic Bowel prep,  Endo/Other  diabetes, Well Controlled, Type 2, Insulin Dependent, Oral Hypoglycemic AgentsMorbid obesity  Renal/GU      Musculoskeletal  (+) Arthritis -,   Abdominal (+) + obese,   Peds  Hematology  (+) anemia ,   Anesthesia Other Findings   Reproductive/Obstetrics                            Anesthesia Physical Anesthesia Plan  ASA: IV and emergent  Anesthesia Plan: MAC   Post-op Pain Management:    Induction: Intravenous  Airway Management Planned: Simple Face Mask  Additional Equipment:   Intra-op Plan:   Post-operative Plan:   Informed Consent: I have reviewed the patients History and Physical, chart, labs and discussed the procedure including the risks, benefits and alternatives for the proposed anesthesia with the patient or authorized representative who has indicated his/her understanding and acceptance.     Plan Discussed with: CRNA  Anesthesia Plan Comments:         Anesthesia Quick Evaluation

## 2015-02-22 NOTE — Progress Notes (Signed)
[place] at [place] [place] NAME: [name]    MR#:  [id number]  DATE OF BIRTH:  Jun 22, 1956  SUBJECTIVE:  Patient cannot obtain EGD yesterday due to hypoxia. She was noted be 87% on room air. EGD and colonoscopy done today.  REVIEW OF SYSTEMS:    ROS  Constitutional: Negative for fever and chills.  Respiratory: Negative for cough, hemoptysis and shortness of breath.  Cardiovascular: Negative for chest pain, palpitations and orthopnea.  Gastrointestinal: Negative for heartburn, nausea, abdominal pain, diarrhea and constipation.  Skin: Negative for rash.  Neurological: Negative for headaches.  Psychiatric/Behavioral: Negative for depression.    DRUG ALLERGIES:  No Known Allergies  VITALS:  Blood pressure 108/51, pulse 66, temperature 97.6 F (36.4 C), temperature source Oral, resp. rate 16, height 5\' 8"  (1.727 m), weight 118.899 kg (262 lb 2 oz), SpO2 100 %.  PHYSICAL EXAMINATION:   Physical Exam  Constitutional: She is oriented to person, place, and time and well-developed, well-nourished, and in no distress. No distress.  HENT:  Head: Normocephalic and atraumatic.  Eyes: Pupils are equal, round, and reactive to light. Left eye exhibits no discharge.  Neck: Normal range of motion. Neck supple.  Cardiovascular: Normal rate and regular rhythm.   No murmur heard. Pulmonary/Chest: Effort normal and breath sounds normal. No respiratory distress. She has no wheezes. She has no rales.  Abdominal: Bowel sounds are normal. She exhibits no distension.  Musculoskeletal: Normal range of motion. She exhibits no edema.  Neurological: She is alert and oriented to person, place, and time. No cranial nerve deficit.  Skin: Skin is warm and dry.  Psychiatric: Affect normal.      LABORATORY PANEL:   CBC  Recent Labs Lab 02/21/15 0415 02/22/15 0438  WBC 3.1*  --   HGB 7.8* 7.3*  HCT 24.4*  --   PLT 214  --     ------------------------------------------------------------------------------------------------------------------  Chemistries   Recent Labs Lab 02/22/15 0438 02/22/15 0500  NA 137  --   K 3.1*  --   CL 101  --   CO2 28  --   GLUCOSE 132*  --   BUN 23*  --   CREATININE 1.51*  --   CALCIUM 7.2*  --   MG  --  1.3*    RADIOLOGY:  Dg Chest 1 View  02/18/2015    IMPRESSION: 1. Bibasilar atelectasis and small effusions increased mildly from prior. 2. PICC line at the junction of the subclavian vein and SVC.   Electronically Signed   By: Suzy Bouchard M.D.   On: 02/18/2015 14:55   ASSESSMENT AND PLAN:   59 year old female who was sent emergency department after receiving noticed that her hemoglobin was low.  1. Acute on chronic blood loss anemia:  patient was supposed to have an EGD and colonoscopy on 5/17, however due to hypoxia this was placed on hold.  Done on 02/22/15- EGD- no abnormalities, Colonoscopy- poor preparation- 2 polyps in ceacum and ascending colon. Patient has received 2 units of PRBCs.. Her aspirin and Plavix are on hold for now. She will continue on Protonix IV twice a day. Iron studies are consistent with iron deficiency anemia. She is already on iron supplementation. cardiology has seen the patient in consultation. She denies any active bleeding.   GI resumed diet- may d/c in 1-2 days. 2. Left heel osteomyelitis:  continue on current antibiotics including vancomycin, Levaquin and doxycycline. Pharmacy is helping to dose his medications.  3. Diabetes: Patient is an ADA diet, sliding scale insulin. 4. Coronary artery disease with coronary stent: Patient will be continued on outpatient medications. Plavix and aspirin are on hold due to problem #1. Will resume on d/c.  5.Chronic systolic heart failure: Patient is not in acute exacerbation at this time, hour over she wasn't hypoxic. Her chest x-ray shows a small effusion and atelectasis. I have also ordered  incentive spirometry. Echocardiogram is pending. I appreciate cardiology consultation.   6. Hyponatremia: improved  7. Hypoxia: started incentive spirometry and  continue on torsemide.    Improved , and tolerated procedures today.    CODE STATUS: DO NOT RESUSCITATE  TOTAL TIME TAKING CARE OF THIS PATIENT: 25 minutes.   POSSIBLE D/C in 1 DAY, DEPENDING ON CLINICAL CONDITION.   Vaughan Basta M.D on 02/22/2015 at 9:56 PM  Between 7am to 6pm - Pager - [phone] After 6pm go to [email or web address] - password [email or web address] [facility]  [place] [facility]  Office  [phone]  CC: Primary care physician; [name], MD

## 2015-02-22 NOTE — Progress Notes (Addendum)
Patient: [name] / Admit Date: 02/17/2015 / Date of Encounter: 02/22/2015, 10:05 AM   Subjective: Going for EGD and colonoscopy this afternoon per nurse. No complaints from patient. Remains on 3L O2 via nasal cannula with O2 sats at 99%. BP somewhat soft this morning with readings 99/500 and 104/59 prior to AM medications. Labs show SCr trending up 1.42-->1.51, K+ 3.0-->3.1, hgb 7.8-->7.3.   Review of Systems: Review of Systems  Constitutional: Positive for malaise/fatigue. Negative for fever, chills, weight loss and diaphoresis.  Eyes: Negative for discharge and redness.  Respiratory: Positive for shortness of breath. Negative for cough, hemoptysis and wheezing.   Cardiovascular: Positive for leg swelling. Negative for chest pain, palpitations, orthopnea and PND.  Gastrointestinal: Positive for abdominal pain. Negative for nausea and vomiting.       Possible melena.  Musculoskeletal: Positive for falls. Negative for myalgias.  Neurological: Positive for weakness. Negative for dizziness, speech change and focal weakness.  Psychiatric/Behavioral: The patient is not nervous/anxious.   All other systems reviewed and negative.   Objective: Telemetry: NSR, 70s, single episode of NSVT 11 beats at 6:24 AM on 5/18 Physical Exam: Blood pressure 104/59, pulse 90, temperature 98.2 F (36.8 C), temperature source Oral, resp. rate 17, height 5\' 8"  (1.727 m), weight 262 lb 2 oz (118.899 kg), SpO2 99 %. Body mass index is 39.87 kg/(m^2). General: Well developed, well nourished, in no acute distress. Head: Normocephalic, atraumatic, sclera non-icteric, no xanthomas, nares are without discharge. Pale conjunctiva.  Neck: Negative for carotid bruits. JVP not elevated. Lungs: Clear bilaterally to auscultation without wheezes, rales, or rhonchi. Breathing is unlabored. Heart: RRR S1 S2 without murmurs, rubs, or gallops.  Abdomen: Obese, soft, non-tender, non-distended with normoactive bowel  sounds. No rebound/guarding. Extremities: No clubbing or cyanosis. No edema. Bilateral lymphedema with dressed left heel.  Neuro: Alert and oriented X 3. Moves all extremities spontaneously. Psych:  Responds to questions appropriately with a normal affect.   Intake/Output Summary (Last 24 hours) at 02/22/15 1005 Last data filed at 02/22/15 0630  Gross per 24 hour  Intake    120 ml  Output   1100 ml  Net   -980 ml    Inpatient Medications:  . amiodarone  400 mg Oral BID  . carvedilol  25 mg Oral BID WC  . doxycycline  100 mg Oral BID  . feeding supplement (RESOURCE BREEZE)  1 Container Oral TID WC  . ferrous sulfate  325 mg Oral BID WC  . gabapentin  600 mg Oral TID  . insulin aspart  0-9 Units Subcutaneous TID WC  . isosorbide mononitrate  60 mg Oral Daily  . levofloxacin  500 mg Oral Daily  . nortriptyline  50 mg Oral QHS  . nystatin   Topical TID  . pantoprazole (PROTONIX) IV  40 mg Intravenous Q12H  . potassium chloride  20 mEq Oral BID  . pravastatin  40 mg Oral QHS  . torsemide  40 mg Oral Daily  . vancomycin  1,500 mg Intravenous Q36H   Infusions:  . sodium chloride Stopped (02/22/15 0800)    Labs:  Recent Labs  02/21/15 0415 02/22/15 0438  NA 137 137  K 3.0* 3.1*  CL 101 101  CO2 29 28  GLUCOSE 59* 132*  BUN 24* 23*  CREATININE 1.42* 1.51*  CALCIUM 7.2* 7.2*   No results for input(s): AST, ALT, ALKPHOS, BILITOT, PROT, ALBUMIN in the last 72 hours.  Recent Labs  02/21/15 0415 02/22/15 [id number]  WBC 3.1*  --   HGB 7.8* 7.3*  HCT 24.4*  --   MCV 72.4*  --   PLT 214  --    No results for input(s): CKTOTAL, CKMB, TROPONINI in the last 72 hours. Invalid input(s): POCBNP No results for input(s): HGBA1C in the last 72 hours.   Weights: Filed Weights   02/17/15 1032 02/17/15 1307  Weight: 368 lb (166.924 kg) 262 lb 2 oz (118.899 kg)     Radiology/Studies:  Dg Chest 1 View  02/18/2015   CLINICAL DATA:  Fever and anemia.  EXAM: CHEST  1 VIEW   COMPARISON:  Radiograph 01/16/2015  FINDINGS: Interval placement of a right PICC line with tip at the junction of the right subclavian vein and superior vena cava. Left-sided pacemaker with continuous leads overlies normal cardiac silhouette. There is bibasilar atelectasis and small effusions mildly increased.  IMPRESSION: 1. Bibasilar atelectasis and small effusions increased mildly from prior. 2. PICC line at the junction of the subclavian vein and SVC.   Electronically Signed   By: Suzy Bouchard M.D.   On: 02/18/2015 14:55   Dg Chest 2 View  02/21/2015   CLINICAL DATA:  59 year old female with a history of shortness of breath.  EXAM: CHEST - 2 VIEW  COMPARISON:  02/18/2015, 01/16/2015, 11/01/2014  FINDINGS: Cardiomediastinal silhouette unchanged in size and contour. Atherosclerotic calcifications of the aortic arch.  Low lung volumes accentuates the interstitium.  Persistent bilateral interstitial and airspace opacities with fluid in the minor fissure.  No pneumothorax.  Unchanged left-sided cardiac pacing device.  Blunting of the right costophrenic angle.  Unchanged position of right upper extremity PICC, terminating in the superior vena cava.  No displaced fracture.  Unremarkable appearance of the upper abdomen.  IMPRESSION: Low lung volumes with evidence of persisting edema and small right pleural effusion.  Unchanged position of right upper extremity PICC.  Signed,  Dulcy Fanny. Earleen Newport, DO  [facility]  [facility]   Electronically Signed   By: Corrie Mckusick D.O.   On: 02/21/2015 08:56   Nm Bone 3 Phase Ltd  01/27/2015   CLINICAL DATA:  Chronic open wound of left foot for 6 weeks. Infection, swelling and diabetes.  EXAM: NUCLEAR MEDICINE 3-PHASE BONE SCAN  TECHNIQUE: Radionuclide angiographic images, immediate static blood pool images, and 3-hour delayed static images were obtained of the feet after intravenous injection of radiopharmaceutical.   RADIOPHARMACEUTICALS:  23.49 Technetium 99 MDP  COMPARISON:  None.  FINDINGS: Vascular phase: There is slight asymmetric increased perfusion to the left hindfoot on the early vascular phase.  Blood pool phase: Asymmetric increased uptake localizing to the left hindfoot. Increased blood pool activity localizing to the second or third left phalanx also noted.  Delayed phase: Increased radiotracer uptake localizing to the midfoot and hindfoot regions of both feet noted. Increased delayed phase uptake localizes to the left second phalanx.  IMPRESSION: 1. There is increased uptake on all 3 phases localizing to the left hindfoot region. This may be a manifestation of osteomyelitis. Correlation with exact site of nonhealing ulcer. 2. There is increased uptake localizing to the second phalanx of the left foot on the blood pool and delayed phase images. 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 Underlying infection not excluded.   Electronically Signed   By: Kerby Moors M.D.   On: 01/27/2015 08:11     Assessment and Plan  1. Acute respiratory failure: -Stable -Requiring 3L O2  via nasal cannula to maintain O2 sats  -No signs of acute on chronic CHF on echo  -On incentive spirometry and torsemide  -Anemia possibly playing a role  -Wean O2 as tolerated  2. Acute on chronic anemia: -Likely 2/2 blood loss -GI planning for EGD and colonoscopy later in the afternoon on 5/18 -It is likely that some of her SOB symptoms are 2/2 her low hgb -From a cardiac standpoint echo did not show any acute findings that would preclude GI evaluation of her acute on chronic anemia  -Will hold torsemide and Coreg this morning in an effort to bolster her blood pressure some heading into her GI procedure. Should her blood pressure become somewhat soft during the procedure it would be ok to give 250 mL NS bolus   3. NSVT: -History of several episodes of non-paced ventricular  rhythm/NSVT overnight on 5/16 and the morning of 5/17, as well as SVT on the morning of 5/17 with rare pacer spikes seen intermittently at times and varying QRS dimensions  -She is status post MDT CRT-D, she will need follow up with Dr. Caryl Comes as an outpatient  -On Coreg 25 mg bid, held this AM as above for adding BP support for her GI procedure -Continue amiodarone 400 mg bid x 1 week at this time given recurrent NSVT of 11 beats seen this morning on telemetry with decrease to 200 mg daily in 1 week (5/24). She will remain on 200 mg bid x 1 week and transition to 200 mg at that time (5/31) -Replete K+ and check magnesium as below -Check TSH -Her EF actually improved on echo 02/2015 to 50-55% from previous of 30% on 04/2014  4. Acute renal injury: -Hold AM dose of torsemide given bump in SCr from 1.42-->1.51 -Monitor -Possibly multifactorial including, ATN in the setting of hypotension/anemia and prerenal azotemia/overdiuresis   5. Ischemic cardiomyopathy: -Prior EF 04/2014 of 30% -Echo 02/2015 showed EF 50-55%, no RWMA, GR1DD, mild MR, normal PASP -Continue Coreg as above  6. CAD: -Status post stenting of occluded LAD in 2013, known chronically occluded PDA -No symptoms of angina  -Hold Imdur at this time given soft BP, may restart post GI procedure at lower dose of 15 mg daily pending blood pressure  7. Diabetes: -On SSI -Per IM  7. Left heel osteomyelitis: -On doxycycline and vancomycin per IM  9. Hypokalemia: -Possibly precipitating #3 -Replete to 4.0 -Check magnesium  10. OSA on CPAP   Signed, Christell Faith, PA-C Pager: [phone] 02/22/2015, 10:05 AM    Attending note  Patient seen and examined, agree with detailed note above,  Patient presentation and plan discussed on rounds.   Doing well as morning, nothing by mouth for EGD, possible colonoscopy Blood pressure is low overnight and this morning Climb in creatinine Review of telemetry did show 12-13 beat  run of nonsustained VT at 6 AM  --We will hold the torsemide, Coreg this morning Discontinue hydralazine as blood pressure has been running very low We have placed hold parameters on the isosorbide We will continue by mouth  amiodarone  twice a day as above for nonsustained VT   Esmond Plants  M.D., Ph.D. Signed 02/22/2015 at 10:36 AM

## 2015-02-22 NOTE — Op Note (Signed)
[facility] Patient Name: Endia Prier Procedure Date: 02/22/2015 1:56 PM MRN: [id number] Account #: [id number] Date of Birth: 15-Jul-1956 Admit Type: Inpatient Age: 59 Room: [facility] ENDO ROOM 1 Gender: Female Note Status: Finalized Procedure:         Upper GI endoscopy Indications:       Iron deficiency anemia secondary to chronic blood loss,                     Melena; on chronic Fe Providers:         Lupita Dawn. Candace Cruise, MD Referring MD:      Forest Gleason Md, MD (Referring MD) Medicines:         Monitored Anesthesia Care Complications:     No immediate complications. Procedure:         Pre-Anesthesia Assessment:                    - Prior to the procedure, a History and Physical was                     performed, and patient medications, allergies and                     sensitivities were reviewed. The patient's tolerance of                     previous anesthesia was reviewed.                    - The risks and benefits of the procedure and the sedation                     options and risks were discussed with the patient. All                     questions were answered and informed consent was obtained.                    - After reviewing the risks and benefits, the patient was                     deemed in satisfactory condition to undergo the procedure.                    After obtaining informed consent, the endoscope was passed                     under direct vision. Throughout the procedure, the                     patient's blood pressure, pulse, and oxygen saturations                     were monitored continuously. The Olympus GIF-160 endoscope                     (S#. [id number]) was introduced through the mouth, and                     advanced to the second part of duodenum. The upper GI                     endoscopy was accomplished without difficulty. The patient  tolerated the procedure well. Findings:      The examined  esophagus was normal.      The entire examined stomach was normal.      The examined duodenum was normal. Impression:        - Normal esophagus.                    - Normal stomach.                    - Normal examined duodenum.                    - No specimens collected. Recommendation:    - Observe patient's clinical course.                    - The findings and recommendations were discussed with the                     patient.                    - Proceed with colonoscopy. Procedure Code(s): --- Professional ---                    [phone], Esophagogastroduodenoscopy, flexible, transoral;                     diagnostic, including collection of specimen(s) by                     brushing or washing, when performed (separate procedure) Diagnosis Code(s): --- Professional ---                    D50.0, Iron deficiency anemia secondary to blood loss                     (chronic)                    K92.1, Melena CPT copyright 2014 American Medical Association. All rights reserved. The codes documented in this report are preliminary and upon coder review may  be revised to meet current compliance requirements. Hulen Luster, MD 02/22/2015 2:05:22 PM This report has been signed electronically. Number of Addenda: 0 Note Initiated On: 02/22/2015 1:56 PM      [facility]

## 2015-02-22 NOTE — Care Management (Signed)
Review of case. Patient was receiving services form [facility] prior to this admission for IV ABX and nursing care.

## 2015-02-22 NOTE — Op Note (Signed)
[facility]enterology Patient Name: [name] Procedure Date: 02/22/2015 1:05 PM MRN: [id number] Account #: [id number] Date of Birth: 1956-03-22 Admit Type: Inpatient Age: 59 Room: [facility] ENDO ROOM 1 Gender: Female Note Status: Finalized Procedure:         Colonoscopy Indications:       Anemia, Iron deficiency anemia secondary to chronic blood                     loss Providers:         Lupita Dawn. Candace Cruise, MD Referring MD:      Forest Gleason Md, MD (Referring MD) Medicines:         Monitored Anesthesia Care Complications:     No immediate complications. Procedure:         Pre-Anesthesia Assessment:                    - Prior to the procedure, a History and Physical was                     performed, and patient medications, allergies and                     sensitivities were reviewed. The patient's tolerance of                     previous anesthesia was reviewed.                    - The risks and benefits of the procedure and the sedation                     options and risks were discussed with the patient. All                     questions were answered and informed consent was obtained.                    - After reviewing the risks and benefits, the patient was                     deemed in satisfactory condition to undergo the procedure.                    After obtaining informed consent, the colonoscope was                     passed under direct vision. Throughout the procedure, the                     patient's blood pressure, pulse, and oxygen saturations                     were monitored continuously. The Colonoscope was                     introduced through the anus and advanced to the the cecum,                     identified by appendiceal orifice and ileocecal valve. The                     colonoscopy was performed with difficulty due to poor  endoscopic visualization. The patient tolerated the                     procedure  well. The quality of the bowel preparation was                     poor. Findings:      A medium polyp was found in the cecum. The polyp was sessile. The polyp       was removed with a hot snare. Resection and retrieval were complete.      A small polyp was found in the proximal ascending colon. The polyp was       sessile. The polyp was removed with a hot snare. Resection and retrieval       were complete.      Prep was poor, esp on left side of colon. Despite numerous lavages and       suctioning, unable to visualize left side of colon well.      The exam was otherwise without abnormality. Impression:        - Preparation of the colon was poor.                    - One medium polyp in the cecum. Resected and retrieved.                    - One small polyp in the proximal ascending colon.                     Resected and retrieved.                    - The examination was otherwise normal. Recommendation:    - Await pathology results.                    - Repeat colonoscopy in 1 year for surveillance.                    - The findings and recommendations were discussed with the                     patient. Procedure Code(s): --- Professional ---                    [phone], Colonoscopy, flexible; with removal of tumor(s),                     polyp(s), or other lesion(s) by snare technique Diagnosis Code(s): --- Professional ---                    D12.0, Benign neoplasm of cecum                    D12.2, Benign neoplasm of ascending colon                    D64.9, Anemia, unspecified                    D50.0, Iron deficiency anemia secondary to blood loss                     (chronic) CPT copyright 2014 American Medical Association. All rights reserved. The codes documented in this report are preliminary and upon coder review may  be revised to meet current compliance requirements. Lupita Dawn  Candace Cruise, MD 02/22/2015 2:33:28 PM This report has been signed electronically. Number of Addenda:  0 Note Initiated On: 02/22/2015 1:05 PM      [facility]

## 2015-02-22 NOTE — Progress Notes (Signed)
Tap water enema given and completed per M.D. Order. Will follow up with day shift nurse

## 2015-02-22 NOTE — Consult Note (Signed)
WOC wound consult note Reason for Consult: Pressure ulcers to bilateral ischium, coccyx and left  Heel, present on admission.  Wound type: Pressure ulcers Pressure Ulcer POA: Yes Measurement: unstageable ulcer to coccyx:  2 cm x 2 cm x 0.5 cm unable to visualize wound bed due to adherent yellow slough 2nd lesion present  Between coccyx and sacrum 0.5 cm x 0.5 cm x 0.1 cm adherent yellow slough in wound bed.  Bilateral ischium:  Left ischium 1 cm x 1.5 cm x 0.1 cm yellow adherent slough unstageable Right ischium: 1 cm x 1 cm x 0.1 cm yellow adherent slough unstageable  Left heel : 2 cm x 2 cm x 0.5 cm wound bed 100% beefy red Unstageable Device related pressure ulcer to bridge of nose from CPap machine worn at home. Has been on continuous oxygen here and has not required Cpap. Will keep open to air.  2.4 cm x 0.5 cm 100% scabbed. Wound bed: adherent yellow slough Drainage (amount, consistency, odor) Moderate serosanguinous drainage.  No odor.  Periwound: Erythema around sacral/coccyx and ischial wounds.  Left heel periwound intact Dressing procedure/placement/frequency: Cleanse left heel wound with NS and pat gently dry.  Apply Xeroform gauze to wound bed.  Cover with 4x4 gauze.  Wrap with kerlix and secure with tape. Change daily.   Cleanse pressure ulcers to sacrum and bilateral ischial tuberosity with NS and pat gently dry.  Apply Santyl to wound bed, 1/8 inch thickness, (opaque) and cover with NS moist dressing.  Secure with ABd pad and tape.  Change daily.   Cleanse perineal skin folds with soap and water daily.  Apply Interdry  Moisture wicking fabric to skin folds. Measure and cut length of InterDry Ag+ to fit in skin folds that have skin breakdown Tuck InterDry  Ag+ fabric into skin folds in a single layer, allow for 2 inches of overhang from skin edges to allow for wicking to occur May remove to bathe; dry area thoroughly and then tuck into affected areas again Do not apply any creams  or ointments when using InterDry Ag+ DO NOT THROW AWAY FOR 5 DAYS unless soiled with stool DO NOT [facility] product, this will inactivate the silver in the material  New sheet of Interdry Ag+ should be applied after 5 days of use if patient continues to have skin breakdown    Will not follow at this time.  Please re-consult if needed.  Domenic Moras RN BSN [place] Pager [phone]

## 2015-02-22 NOTE — Consult Note (Signed)
  GI Inpatient Follow-up Note  Patient Identification: [name] is a 59 y.o. female with hx of GI bleeding.  Subjective: No further bleeding. No drop in hgb. O2 Sat much better today. On no O2 now. Had been on clears since Sunday after bowel prep. Given tap water enema this AM. Stool clear.  Scheduled Inpatient Medications:  . amiodarone  400 mg Oral BID  . carvedilol  25 mg Oral BID WC  . collagenase   Topical Daily  . doxycycline  100 mg Oral BID  . feeding supplement (RESOURCE BREEZE)  1 Container Oral TID WC  . ferrous sulfate  325 mg Oral BID WC  . gabapentin  600 mg Oral TID  . insulin aspart  0-9 Units Subcutaneous TID WC  . isosorbide mononitrate  60 mg Oral Daily  . levofloxacin  500 mg Oral Daily  . nortriptyline  50 mg Oral QHS  . nystatin   Topical TID  . pantoprazole (PROTONIX) IV  40 mg Intravenous Q12H  . potassium chloride  20 mEq Oral BID  . pravastatin  40 mg Oral QHS  . torsemide  40 mg Oral Daily  . vancomycin  1,500 mg Intravenous Q36H    Continuous Inpatient Infusions:   . sodium chloride Stopped (02/22/15 0800)    PRN Inpatient Medications:  acetaminophen **OR** acetaminophen, alum & mag hydroxide-simeth, nitroGLYCERIN, ondansetron **OR** ondansetron (ZOFRAN) IV, senna-docusate  Review of Systems: Constitutional: Weight is stable.  Eyes: No changes in vision. ENT: No oral lesions, sore throat.  GI: see HPI.  Heme/Lymph: No easy bruising.  CV: No chest pain.  GU: No hematuria.  Integumentary: No rashes.  Neuro: No headaches.  Psych: No depression/anxiety.  Endocrine: No heat/cold intolerance.  Allergic/Immunologic: No urticaria.  Resp: No cough, SOB.  Musculoskeletal: No joint swelling.    Physical Examination: BP 104/59 mmHg  Pulse 90  Temp(Src) 98.2 F (36.8 C) (Oral)  Resp 17  Ht 5\' 8"  (1.727 m)  Wt 118.899 kg (262 lb 2 oz)  BMI 39.87 kg/m2  SpO2 99% Gen: NAD, alert and oriented x 4 HEENT: PEERLA, EOMI, Neck: supple, no  JVD or thyromegaly Chest: CTA bilaterally, no wheezes, crackles, or other adventitious sounds CV: RRR, no m/g/c/r Abd: soft, NT, ND, +BS in all four quadrants; no HSM, guarding, ridigity, or rebound tenderness Ext: no edema, well perfused with 2+ pulses, Skin: no rash or lesions noted Lymph: no LAD  Data: Lab Results  Component Value Date   WBC 3.1* 02/21/2015   HGB 7.3* 02/22/2015   HCT 24.4* 02/21/2015   MCV 72.4* 02/21/2015   PLT 214 02/21/2015    Recent Labs Lab 02/20/15 0506 02/21/15 0415 02/22/15 0438  HGB 7.4* 7.8* 7.3*   Lab Results  Component Value Date   NA 137 02/22/2015   K 3.1* 02/22/2015   CL 101 02/22/2015   CO2 28 02/22/2015   BUN 23* 02/22/2015   CREATININE 1.51* 02/22/2015   Lab Results  Component Value Date   ALT <8 01/16/2015   AST 18 01/16/2015   ALKPHOS 76 01/16/2015   BILITOT 0.4 01/16/2015   No results for input(s): APTT, INR, PTT in the last 168 hours. Assessment/Plan: [name] is a 59 y.o. female with hx of GI bleeding. Stable now.   Recommendations: Proceed with EGD and colonosocopy later this afternoon. Please call with questions or concerns.  Wyeth Hoffer, Lupita Dawn, MD

## 2015-02-22 NOTE — Transfer of Care (Signed)
Immediate Anesthesia Transfer of Care Note  Patient: [name]  Procedure(s) Performed: Procedure(s): COLONOSCOPY WITH PROPOFOL (Left) ESOPHAGOGASTRODUODENOSCOPY (EGD) (N/A)  Patient Location: PACU  Anesthesia Type:General  Level of Consciousness: awake, alert , oriented and patient cooperative  Airway & Oxygen Therapy: Patient Spontanous Breathing and Patient connected to nasal cannula oxygen  Post-op Assessment: Report given to RN, Post -op Vital signs reviewed and stable and Patient moving all extremities X 4  Post vital signs: Reviewed and stable  Last Vitals:  Filed Vitals:   02/22/15 1450  BP: 96/57  Pulse: 74  Temp: 35.8 C  Resp: 19    Complications: No apparent anesthesia complications

## 2015-02-23 ENCOUNTER — Encounter: Payer: PPO | Admitting: Surgery

## 2015-02-23 ENCOUNTER — Inpatient Hospital Stay: Payer: PPO

## 2015-02-23 ENCOUNTER — Ambulatory Visit: Payer: PPO | Admitting: Surgery

## 2015-02-23 LAB — BASIC METABOLIC PANEL
Anion gap: 7 (ref 5–15)
BUN: 20 mg/dL (ref 6–20)
CHLORIDE: 101 mmol/L (ref 101–111)
CO2: 27 mmol/L (ref 22–32)
Calcium: 7.4 mg/dL — ABNORMAL LOW (ref 8.9–10.3)
Creatinine, Ser: 1.49 mg/dL — ABNORMAL HIGH (ref 0.44–1.00)
GFR calc Af Amer: 43 mL/min — ABNORMAL LOW (ref >60)
GFR calc non Af Amer: 37 mL/min — ABNORMAL LOW (ref >60)
GLUCOSE: 199 mg/dL — AB (ref 65–99)
POTASSIUM: 4 mmol/L (ref 3.5–5.1)
Sodium: 135 mmol/L (ref 135–145)

## 2015-02-23 LAB — CBC
HCT: 24.9 % — ABNORMAL LOW (ref 35.0–47.0)
HEMOGLOBIN: 7.8 g/dL — AB (ref 12.0–16.0)
MCH: 23 pg — ABNORMAL LOW (ref 26.0–34.0)
MCHC: 31.3 g/dL — AB (ref 32.0–36.0)
MCV: 73.7 fL — ABNORMAL LOW (ref 80.0–100.0)
Platelets: 191 10*3/uL (ref 150–440)
RBC: 3.38 MIL/uL — ABNORMAL LOW (ref 3.80–5.20)
RDW: 20.1 % — ABNORMAL HIGH (ref 11.5–14.5)
WBC: 3.7 10*3/uL (ref 3.6–11.0)

## 2015-02-23 LAB — GLUCOSE, CAPILLARY
Glucose-Capillary: 179 mg/dL — ABNORMAL HIGH (ref 65–99)
Glucose-Capillary: 302 mg/dL — ABNORMAL HIGH (ref 65–99)
Glucose-Capillary: 307 mg/dL — ABNORMAL HIGH (ref 65–99)
Glucose-Capillary: 224 mg/dL — ABNORMAL HIGH (ref 65–99)

## 2015-02-23 LAB — CREATININE, SERUM
CREATININE: 1.52 mg/dL — AB (ref 0.44–1.00)
GFR calc Af Amer: 42 mL/min — ABNORMAL LOW (ref >60)
GFR calc non Af Amer: 36 mL/min — ABNORMAL LOW (ref >60)

## 2015-02-23 LAB — PREPARE RBC (CROSSMATCH)

## 2015-02-23 LAB — ABO/RH: ABO/RH(D): O POS

## 2015-02-23 MED ORDER — AMIODARONE HCL 400 MG PO TABS: 400.0000 mg | ORAL_TABLET | Freq: Two times a day (BID) | ORAL | Status: DC

## 2015-02-23 MED ORDER — SODIUM CHLORIDE 0.9 % IV SOLN
Freq: Once | INTRAVENOUS | Status: AC
  Administered 2015-02-23: 16:00:00 via INTRAVENOUS

## 2015-02-23 MED ORDER — AMIODARONE HCL 400 MG PO TABS: 400.0000 mg | ORAL_TABLET | Freq: Every day | ORAL | Status: DC

## 2015-02-23 MED ORDER — ISOSORBIDE MONONITRATE ER 30 MG PO TB24
30.0000 mg | ORAL_TABLET | Freq: Every day | ORAL | Status: DC
  Administered 2015-02-24: 30 mg via ORAL
  Filled 2015-02-23: qty 1

## 2015-02-23 MED ORDER — SODIUM CHLORIDE 0.9 % IV SOLN
100.0000 mg | Freq: Once | INTRAVENOUS | Status: AC
  Administered 2015-02-23: 100 mg via INTRAVENOUS
  Filled 2015-02-23: qty 2

## 2015-02-23 MED ORDER — VANCOMYCIN HCL 10 G IV SOLR: 1500.0000 mg | INTRAVENOUS | Status: DC

## 2015-02-23 NOTE — Progress Notes (Signed)
ANTIBIOTIC CONSULT NOTE - FOLLOW UP  Pharmacy Consult for Vancomycin/Levaquin/Doxycycline Indication: Osteomyelitis  No Known Allergies  Patient Measurements: Height: 5\' 8"  (172.7 cm) Weight: 262 lb 2 oz (118.899 kg) IBW/kg (Calculated) : 63.9 Adjusted Body Weight:    Vital Signs: Temp: 98.3 F (36.8 C) (05/19 0822) Temp Source: Oral (05/19 0822) BP: 117/53 mmHg (05/19 0822) Pulse Rate: 73 (05/19 0822)  Labs:  Recent Labs  02/21/15 0415 02/22/15 0438 02/23/15 0454  WBC 3.1*  --   --   HGB 7.8* 7.3*  --   PLT 214  --   --   CREATININE 1.42* 1.51* 1.52*   Estimated Creatinine Clearance: 52.7 mL/min (by C-G formula based on Cr of 1.52).  Recent Labs  02/20/15 2325  [place]     Microbiology: No results found for this or any previous visit (from the past 720 hour(s)).  Anti-infectives    Start     Dose/Rate Route Frequency Ordered Stop   02/21/15 1000  levofloxacin (LEVAQUIN) tablet 500 mg     500 mg Oral Daily 02/21/15 0924     02/21/15 1000  levofloxacin (LEVAQUIN) tablet 250 mg     250 mg Oral  Once 02/21/15 0945 02/21/15 0957   02/19/15 1030  vancomycin (VANCOCIN) 1,500 mg in sodium chloride 0.9 % 500 mL IVPB     1,500 mg 250 mL/hr over 120 Minutes Intravenous Every 36 hours 02/19/15 0932     02/19/15 1000  levofloxacin (LEVAQUIN) tablet 250 mg  Status:  Discontinued     250 mg Oral Daily 02/18/15 0037 02/21/15 0924   02/18/15 1100  vancomycin (VANCOCIN) 1,500 mg in sodium chloride 0.9 % 500 mL IVPB  Status:  Discontinued     1,500 mg 250 mL/hr over 120 Minutes Intravenous Every 24 hours 02/17/15 1519 02/18/15 1324   02/18/15 0045  levofloxacin (LEVAQUIN) tablet 500 mg     500 mg Oral  Once 02/18/15 0037 02/18/15 1409   02/17/15 1345  vancomycin (VANCOCIN) 1,500 mg in sodium chloride 0.9 % 500 mL IVPB  Status:  Discontinued     1,500 mg 250 mL/hr over 120 Minutes Intravenous Every 24 hours 02/17/15 1330 02/17/15 1441   02/17/15 1345  doxycycline  (VIBRA-TABS) tablet 100 mg     100 mg Oral 2 times daily 02/17/15 1330     02/17/15 1345  levofloxacin (LEVAQUIN) tablet 500 mg  Status:  Discontinued     500 mg Oral Daily 02/17/15 1330 02/18/15 0037     Assessment: Pharmacy consulted to continue vancomycin regimen from outpatient. Patient is seen by Dr Ola Spurr [facility] outpatient for Osteomyelitis. Patient has CHF, and unknown baseline renal function. Was ordered vancomycin 1500mg  IV Q24H as an outpatient.   Creatinine stable at current time 5/19.  Goal of Therapy:  Vancomycin trough level 15-20 mcg/ml 5/16 PM vancomycin trough 17. Continue current regimen.  Plan:   Patient on vancomycin 1500mg  IV Q36H. Pt on levaquin 500mg  po daily. Continue doxycycline 100mg  PO Q12H . Will order next Vancomycin trough for 5/21 at 1000. Continue to evaluate Renal fxn.  Chinita Greenland PharmD Clinical Pharmacist 02/23/2015

## 2015-02-23 NOTE — Progress Notes (Signed)
Pt have DVT in bracheal vein on left UL. We need to decide anticoagulation in concesus with GI.  She is receiving a unit of blood today.  Will cancel d/c and keep her here tonight.

## 2015-02-23 NOTE — Progress Notes (Signed)
Inpatient Diabetes Program Recommendations  AACE/ADA: New Consensus Statement on Inpatient Glycemic Control (2013)  Target Ranges:  Prepandial:   less than 140 mg/dL      Peak postprandial:   less than 180 mg/dL (1-2 hours)      Critically ill patients:  140 - 180 mg/dL   Results for NYKEBA, LINGLE (MRN [id number]) as of 02/23/2015 15:50  Ref. Range 02/22/2015 11:32 02/22/2015 16:29 02/22/2015 21:03 02/23/2015 07:46 02/23/2015 12:41  Glucose-Capillary Latest Ref Range: 65-99 mg/dL 93 82 235 (H) 179 (H) 302 (H)   Home DM Meds: Amaryl 4 mg bid  Januvia 100 mg daily  Metformin 1000 mg daily  Current DM Orders: Novolog Sensitive SSI (0-9 units) tid with meals  Consider ordering Lantus 12 units (0.1u/kg) q 24 hours beginning this evening. Consider increasing the Novolog correction to moderate correction 0-15 units tid. Consider adding hs correction 0-5 units (orders located in the Glycemic Control Order set)  Gentry Fitz, RN, BA, MHA, CDE Diabetes Coordinator Inpatient Diabetes Program  [phone] (Team Pager) [phone] ([place]) 02/23/2015 3:58 PM

## 2015-02-23 NOTE — Care Management (Signed)
Patient will be discharged today with home health PT,and  Nursing for IV ABX therapy. Spoke with the patient who is alert and oriented. She stated that she has been fine at home and that she lives with her husband and her adult daughter who is moving in to help. Patient has a walker and wheelchair in the home. No difficulty with meds or making appointments. O2 weaning by Nursing now 0.5LPM not on home O2 and no qualifying diagnosis. Do not anticipate home O2.

## 2015-02-23 NOTE — Progress Notes (Signed)
Notified MD of low BP - 104/54; manual 110/50. Dr Lavetta Nielsen is okay with holding PM amiodorone dose. Will continue to monitor.

## 2015-02-23 NOTE — Progress Notes (Signed)
PT Cancellation Note  Patient Details Name: [name] MRN: [id number] DOB: [birth year]   Cancelled Treatment:    Reason Eval/Treat Not Completed: Medical issues which prohibited therapy;Patient not medically ready. Pt diagnosed earlier today with LUE DVT. No intervention initiated at the moment. Consulted RN who reports that cardiology was just in room and concerned with pt using LUE for weightbearing secondary to LUE DVT. Per PT protocol pt is contraindicated for therapy until 5 hours after first dose of heparin when DVT is diagnosed. RN and care management notified. Will attempt to evaluate patient tomorrow if medically appropriate.   Lyndel Safe Navdeep Fessenden, PT Rhya Shan 02/23/2015, 3:00 PM

## 2015-02-23 NOTE — Progress Notes (Addendum)
Patient: [name] / Admit Date: 02/17/2015 / Date of Encounter: 02/23/2015, 8:21 AM   Subjective: EGD on 5/18 with no abnormalities. Colonoscopy 5/18 with poor prep, however 2 polyps found in caecum and ascending colon. Aspirin and Plavix on hold. CBC pending this morning. Currently in IV Protonix. O2 has been weaned down to 1L via nasal cannula with sats at 96% this morning. No chest pain, palpitations. No SOB. Blood pressure in the low 100s/50s overnight, po amiodarone was held.    Review of Systems: Review of Systems  Constitutional: Positive for weight loss and malaise/fatigue. Negative for fever, chills and diaphoresis.  HENT: Negative for sore throat.   Eyes: Negative for discharge and redness.  Respiratory: Negative for cough, hemoptysis, sputum production, shortness of breath and wheezing.   Cardiovascular: Positive for palpitations and leg swelling. Negative for chest pain, orthopnea, claudication and PND.  Gastrointestinal: Negative for heartburn, nausea, vomiting, abdominal pain, diarrhea, constipation, blood in stool and melena.  Musculoskeletal: Negative for myalgias.  Skin: Negative for itching and rash.  Neurological: Positive for weakness. Negative for dizziness, tingling, tremors and focal weakness.  Psychiatric/Behavioral: The patient has insomnia. The patient is not nervous/anxious.   All other systems reviewed and negative.   Objective: Telemetry: NSR, 70s, no NSVT, occasional PVCs Physical Exam: Blood pressure 117/54, pulse 73, temperature 97.9 F (36.6 C), temperature source Oral, resp. rate 18, height 5\' 8"  (1.727 m), weight 262 lb 2 oz (118.899 kg), SpO2 97 %. Body mass index is 39.87 kg/(m^2). General: Well developed, well nourished, in no acute distress. Head: Normocephalic, atraumatic, sclera non-icteric, no xanthomas, nares are without discharge. Pale conjunctiva.  Neck: Negative for carotid bruits. JVP not elevated. Lungs: Clear bilaterally to  auscultation without wheezes, rales, or rhonchi. Breathing is unlabored. Heart: RRR S1 S2 without murmurs, rubs, or gallops.  Abdomen: Obese, soft, non-tender, non-distended with normoactive bowel sounds. No rebound/guarding. Extremities: No clubbing or cyanosis. No edema. Distal pedal pulses are 2+ and equal bilaterally. Neuro: Alert and oriented X 3. Moves all extremities spontaneously. Psych:  Responds to questions appropriately with a normal affect.   Intake/Output Summary (Last 24 hours) at 02/23/15 0821 Last data filed at 02/23/15 0600  Gross per 24 hour  Intake   1710 ml  Output   1200 ml  Net    510 ml    Inpatient Medications:  . amiodarone  400 mg Oral BID  . carvedilol  25 mg Oral BID WC  . collagenase   Topical Daily  . doxycycline  100 mg Oral BID  . feeding supplement (RESOURCE BREEZE)  1 Container Oral TID WC  . ferrous sulfate  325 mg Oral BID WC  . gabapentin  600 mg Oral TID  . insulin aspart  0-9 Units Subcutaneous TID WC  . isosorbide mononitrate  60 mg Oral Daily  . levofloxacin  500 mg Oral Daily  . nortriptyline  50 mg Oral QHS  . nystatin   Topical TID  . pantoprazole (PROTONIX) IV  40 mg Intravenous Q12H  . potassium chloride  20 mEq Oral BID  . pravastatin  40 mg Oral QHS  . torsemide  40 mg Oral Daily  . vancomycin  1,500 mg Intravenous Q36H   Infusions:    Labs:  Recent Labs  02/21/15 0415 02/22/15 0438 02/22/15 0500 02/23/15 0454  NA 137 137  --   --   K 3.0* 3.1*  --   --   CL 101 101  --   --  CO2 29 28  --   --   GLUCOSE 59* 132*  --   --   BUN 24* 23*  --   --   CREATININE 1.42* 1.51*  --  1.52*  CALCIUM 7.2* 7.2*  --   --   MG  --   --  1.3*  --    No results for input(s): AST, ALT, ALKPHOS, BILITOT, PROT, ALBUMIN in the last 72 hours.  Recent Labs  02/21/15 0415 02/22/15 0438  WBC 3.1*  --   HGB 7.8* 7.3*  HCT 24.4*  --   MCV 72.4*  --   PLT 214  --    No results for input(s): CKTOTAL, CKMB, TROPONINI in the last  72 hours. Invalid input(s): POCBNP No results for input(s): HGBA1C in the last 72 hours.   Weights: Filed Weights   02/17/15 1032 02/17/15 1307  Weight: 368 lb (166.924 kg) 262 lb 2 oz (118.899 kg)     Radiology/Studies:  Dg Chest 1 View  02/18/2015   CLINICAL DATA:  Fever and anemia.  EXAM: CHEST  1 VIEW  COMPARISON:  Radiograph 01/16/2015  FINDINGS: Interval placement of a right PICC line with tip at the junction of the right subclavian vein and superior vena cava. Left-sided pacemaker with continuous leads overlies normal cardiac silhouette. There is bibasilar atelectasis and small effusions mildly increased.  IMPRESSION: 1. Bibasilar atelectasis and small effusions increased mildly from prior. 2. PICC line at the junction of the subclavian vein and SVC.   Electronically Signed   By: Suzy Bouchard M.D.   On: 02/18/2015 14:55   Dg Chest 2 View  02/21/2015   CLINICAL DATA:  59 year old female with a history of shortness of breath.  EXAM: CHEST - 2 VIEW  COMPARISON:  02/18/2015, 01/16/2015, 11/01/2014  FINDINGS: Cardiomediastinal silhouette unchanged in size and contour. Atherosclerotic calcifications of the aortic arch.  Low lung volumes accentuates the interstitium.  Persistent bilateral interstitial and airspace opacities with fluid in the minor fissure.  No pneumothorax.  Unchanged left-sided cardiac pacing device.  Blunting of the right costophrenic angle.  Unchanged position of right upper extremity PICC, terminating in the superior vena cava.  No displaced fracture.  Unremarkable appearance of the upper abdomen.  IMPRESSION: Low lung volumes with evidence of persisting edema and small right pleural effusion.  Unchanged position of right upper extremity PICC.  Signed,  Dulcy Fanny. Earleen Newport, DO  [facility]  [facility]   Electronically Signed   By: Corrie Mckusick D.O.   On: 02/21/2015 08:56   Nm Bone 3 Phase Ltd  01/27/2015   CLINICAL DATA:   Chronic open wound of left foot for 6 weeks. Infection, swelling and diabetes.  EXAM: NUCLEAR MEDICINE 3-PHASE BONE SCAN  TECHNIQUE: Radionuclide angiographic images, immediate static blood pool images, and 3-hour delayed static images were obtained of the feet after intravenous injection of radiopharmaceutical.  RADIOPHARMACEUTICALS:  23.49 Technetium 99 MDP  COMPARISON:  None.  FINDINGS: Vascular phase: There is slight asymmetric increased perfusion to the left hindfoot on the early vascular phase.  Blood pool phase: Asymmetric increased uptake localizing to the left hindfoot. Increased blood pool activity localizing to the second or third left phalanx also noted.  Delayed phase: Increased radiotracer uptake localizing to the midfoot and hindfoot regions of both feet noted. Increased delayed phase uptake localizes to the left second phalanx.  IMPRESSION: 1. There is increased uptake on all 3 phases localizing to the left hindfoot region.  This may be a manifestation of osteomyelitis. Correlation with exact site of nonhealing ulcer. 2. There is increased uptake localizing to the second phalanx of the left foot on the blood pool and delayed phase images. 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 Underlying infection not excluded.   Electronically Signed   By: Kerby Moors M.D.   On: 01/27/2015 08:11     Assessment and Plan  1. Acute respiratory failure: -Improving -O2 weaned down to 1L via nasal cannula with 96% sats this morning  -She is not on home O2 at baseline  -No signs of acute on chronic CHF on echo  -On incentive spirometry and previously torsemide, last given 5/17 (held 2/2 bump in SCr) -Anemia possibly playing a role   2. Acute on chronic anemia: -Labs consistent with iron deficiency anemia  -Likely 2/2 blood loss -EGD with no acute abnormalities, colonoscopy with poor prep and 2 polyps (caecum and ascending colon) -IV Protonix  -It is likely  that some of her SOB symptoms are 2/2 her low hgb -Aspirin and Plavix on hold in the setting of acute on chronic anemia, restart prior to discharge -Given time from last PCI >69months (2013) may likely be discharged on single antiplatelet medication to decrease bleeding risk   3. NSVT: -History of several episodes of non-paced ventricular rhythm/NSVT overnight on 5/16 and the morning of 5/17, as well as SVT on the morning of 5/17 with rare pacer spikes seen intermittently at times and varying QRS dimensions  -She is status post MDT CRT-D, she will need follow up with Dr. Caryl Comes as an outpatient  -Continue Coreg 25 mg bid -Continue amiodarone 400 mg bid x 1 week with decrease to 200 mg daily in 1 week (5/24). She will remain on 200 mg bid x 1 week and transition to 200 mg at that time (5/31) -Would continue to administer po amiodarone as IV amiodarone is associated with hypotension  -No further NSVT seen on telemetry this morning -Replete K+ -Replete magnesium   -TSH normal -Her EF actually improved on echo 02/2015 to 50-55% from previous of 30% on 04/2014  4. Acute renal injury: -Hold torsemide given continued elevation in SCr from 1.42-->1.51-->1.52 -Monitor -Possibly multifactorial including, ATN in the setting of hypotension/anemia and prerenal azotemia/overdiuresis   5. Ischemic cardiomyopathy: -Prior EF 04/2014 of 30% -Echo 02/2015 showed EF 50-55%, no RWMA, GR1DD, mild MR, normal PASP -Continue Coreg as above  6. CAD: -Status post stenting of occluded LAD in 2013, known chronically occluded PDA -No symptoms of angina -Given time from last PCI >54months (2013) may likely be discharged on single antiplatelet medication to decrease bleeding risk   -Continue Coreg, Imdur, pravastatin   7. Diabetes: -On SSI -Per IM  7. Left heel osteomyelitis: -On doxycycline and vancomycin per IM  9. Hypokalemia: -Possibly precipitating #3 -Replete to 4.0 -Replete magnesium  10. OSA on  CPAP   Signed, Christell Faith, PA-C Pager: [phone] 02/23/2015, 8:21 AM    Attending Note Patient seen and examined, agree with detailed note above,  Patient presentation and plan discussed on rounds.   No significant NSVT in 24 hours Feels well, no complaints apart from left arm swelling New brachial thrombus, swelling since last Sunday, 5 days. Site of IV this admission Not a candidate for anticoagulation given GI bleed Vascular consulted. Unclear if she would be a candidate for thrombectomy. Has not ambulated, uses a walker. Scheduled to received blood today Will follow  Signed: Esmond Plants  M.D., Ph.D. 02/23/15  2:46 PM

## 2015-02-23 NOTE — Discharge Summary (Addendum)
[place] [facility] [place] NAME: [name]    MR#:  [id number]  DATE OF BIRTH:  [birth year]  DATE OF ADMISSION:  02/17/2015 ADMITTING PHYSICIAN: Bettey Costa, MD  DATE OF DISCHARGE: 02/24/2015  PRIMARY CARE PHYSICIAN: SMITH,KRISTI, MD    ADMISSION DIAGNOSIS:  Anemia due to blood loss, acute [D62]  DISCHARGE DIAGNOSIS:  Active Problems:   GIB (gastrointestinal bleeding)   A fib  DVT SECONDARY DIAGNOSIS:   Past Medical History  Diagnosis Date  . Obesity, unspecified   . Unspecified vitamin D deficiency   . Pure hypercholesterolemia   . Neuropathy 2011  . Chicken pox   . Chronic systolic CHF (congestive heart failure)     a. mixed ICM & NICM; b. EF 20-25% by echo 07/2012, mid-dist 2/3 of LV sev HK/AK, mild MR. echo 10/2012: EF 30-35%, sev HK ant-septal & inf walls, GR1DD, mild MR, PASP 33. c. echo 02/2013: EF 30%, GR1DD, mild MR. echo 04/2014: EF 30%, Septal-lat dyssynchrony, global HK, inf AK, GR1DD, mild MR. d. echo 10/2014: EF50-55%, WM nl, GR1DD, septal mild paradox. e. echo 02/2015: EF 50-55%, wm   . Hypertension   . Anemia   . Automatic implantable cardioverter-defibrillator in situ     a. MDT CRT-D 06/2014, [id number]    . Type II diabetes mellitus   . CAD (coronary artery disease)     a. cardiac cath 101/04/2012: PCI/DES to chronically occluded mLAD, consideration PCI to diag branch in 4 weeks.   Marland Kitchen History of blood transfusion ~ 2011    "plasma; had neuropathy; couldn't walk"  . OSA on CPAP     Moderate with AHI 23/hr and now on CPAP at 16cm H2O.  Her DME is [facility]  . LBBB (left bundle branch block)     HOSPITAL COURSE:   1. Acute on chronic blood loss anemia:  patient was supposed to have an EGD and colonoscopy on 5/17, however due to hypoxia this was placed on hold. Done on 02/22/15- EGD- no abnormalities, Colonoscopy- poor preparation- 2 polyps in ceacum and ascending colon. Patient has received 2 units of PRBCs..  Her aspirin and Plavix are on hold for now. She will continue on Protonix IV twice a day. Iron studies are consistent with iron deficiency anemia. She is already on iron supplementation. cardiology has seen the patient in consultation. She denies any active bleeding.  GI resumed diet-Advised to follow for Colonoscopy in 1 year.  Hb running low inspite of being on iron therapy- will give IV iron- One more Blood transfusion- to bring HB up to 8.  2. Left heel osteomyelitis: continue on current antibiotics including vancomycin, Levaquin and doxycycline. Pharmacy is helping to dose his medications.  Pt follows with Wound clinic and they are managing Abx fo last few months- will continue on discharge and advise to follow with wound clinic.  3. Diabetes: Patient is an ADA diet, sliding scale insulin.     Blood sugar is reasonably stable- advise to cut down oral meds- and do diet control.  4. Coronary artery disease with coronary stent: Patient will be continued on outpatient medications. Plavix and aspirin are on hold due to problem #1. As > 1 year- resume ASA, no plavix.    Appreciated cardiology help.  5.Chronic systolic heart failure: Patient is not in acute exacerbation at this time. Her chest x-ray shows a small effusion and atelectasis.  ordered incentive spirometry. Echocardiogram shows improvement in EF. I appreciate cardiology consultation.  Advised to follow in clinic. Pt had NSVT- on amiodarone 400 mg BID= per cardio, will d/c on that and follow in clinic.  6. Hyponatremia: improved  7. Hypoxia: started incentive spirometry and continue on torsemide.  Improved , and tolerated procedures by GI. On room air now.  8. DVT- left upper limb- bracheal vein  Spoke to Dr. Carroll Kinds- suggest anticoagulation for atleast 2 months.  Spoke to Dr. Candace Cruise- he approved use of Eliquis.   Prescriptions sent to her Pharmacy and confirmed her co pay by case manager.  Pt did not get discharge due to c/o  swelling on arm and low Hb- received Blood transfusion on 02/23/15 and DVT confirmed by [place]- so started on anticoagulation.  DISCHARGE CONDITIONS:   Stable.  CONSULTS OBTAINED:  Treatment Team:  Deboraha Sprang, MD  DRUG ALLERGIES:  No Known Allergies  DISCHARGE MEDICATIONS:   Current Discharge Medication List    START taking these medications   Details  !! amiodarone (PACERONE) 400 MG tablet Take 1 tablet (400 mg total) by mouth 2 (two) times daily. Qty: 12 tablet, Refills: 0    !! amiodarone (PACERONE) 400 MG tablet Take 1 tablet (400 mg total) by mouth daily. Qty: 10 tablet, Refills: 0     !! - Potential duplicate medications found. Please discuss with provider.    CONTINUE these medications which have CHANGED   Details  vancomycin 1,500 mg in sodium chloride 0.9 % 500 mL Inject 1,500 mg into the vein every 36 (thirty-six) hours. Qty: 10 Dose, Refills: 0      CONTINUE these medications which have NOT CHANGED   Details  aspirin EC 81 MG tablet Take 1 tablet (81 mg total) by mouth daily. Qty: 90 tablet, Refills: 3   Associated Diagnoses: Acute systolic heart failure    carvedilol (COREG) 25 MG tablet Take 1 tablet (25 mg total) by mouth 2 (two) times daily with a meal. Qty: 180 tablet, Refills: 3   Associated Diagnoses: Acute systolic heart failure    doxycycline (VIBRAMYCIN) 100 MG capsule Take 1 capsule (100 mg total) by mouth 2 (two) times daily. Qty: 20 capsule, Refills: 0    ferrous sulfate 325 (65 FE) MG tablet Take 325 mg by mouth 2 (two) times daily with a meal.    fluticasone (FLONASE) 50 MCG/ACT nasal spray 1 spray each nostril twice a day Qty: 16 g, Refills: 6    gabapentin (NEURONTIN) 600 MG tablet Take 1 tablet (600 mg total) by mouth 3 (three) times daily. Qty: 270 tablet, Refills: 3   Associated Diagnoses: Diabetic peripheral neuropathy associated with type 2 diabetes mellitus    glimepiride (AMARYL) 4 MG tablet Take 1 tablet (4 mg total) by mouth 2  (two) times daily before a meal. Qty: 180 tablet, Refills: 3   Associated Diagnoses: Type II or unspecified type diabetes mellitus without mention of complication, not stated as uncontrolled    isosorbide mononitrate (IMDUR) 60 MG 24 hr tablet Take 1 tablet (60 mg total) by mouth daily. Qty: 90 tablet, Refills: 3   Associated Diagnoses: Acute systolic heart failure    levofloxacin (LEVAQUIN) 500 MG tablet Take 1 tablet (500 mg total) by mouth daily. Qty: 10 tablet, Refills: 0    metFORMIN (GLUCOPHAGE) 1000 MG tablet Take 1 tablet (1,000 mg total) by mouth daily. Qty: 90 tablet, Refills: 0    nitroGLYCERIN (NITROSTAT) 0.4 MG SL tablet Place 1 tablet (0.4 mg total) under the tongue every 5 (five) minutes as needed for  chest pain. Qty: 30 tablet, Refills: 0    nortriptyline (PAMELOR) 50 MG capsule Take 1 capsule (50 mg total) by mouth at bedtime. Qty: 90 capsule, Refills: 3   Associated Diagnoses: Diabetic peripheral neuropathy associated with type 2 diabetes mellitus    pravastatin (PRAVACHOL) 40 MG tablet Take 1 tablet (40 mg total) by mouth at bedtime. Qty: 90 tablet, Refills: 3   Associated Diagnoses: Pure hypercholesterolemia    sitaGLIPtin (JANUVIA) 100 MG tablet Take 1 tablet (100 mg total) by mouth daily. Qty: 90 tablet, Refills: 0   Associated Diagnoses: Type II diabetes mellitus, uncontrolled    torsemide (DEMADEX) 20 MG tablet Take 2 tablets (40 mg total) by mouth daily. Qty: 60 tablet, Refills: 3      STOP taking these medications     clopidogrel (PLAVIX) 75 MG tablet      hydrALAZINE (APRESOLINE) 50 MG tablet      traMADol (ULTRAM) 50 MG tablet      glucose blood test strip          DISCHARGE INSTRUCTIONS:    [facility]olow with GI, Cardio , Hematology and wound care clinics.  If you experience worsening of your admission symptoms, develop shortness of breath, life threatening emergency, suicidal or homicidal thoughts you must seek medical attention immediately by  calling 911 or calling your MD immediately  if symptoms less severe.  You Must read complete instructions/literature along with all the possible adverse reactions/side effects for all the Medicines you take and that have been prescribed to you. Take any new Medicines after you have completely understood and accept all the possible adverse reactions/side effects.   Please note  You were cared for by a hospitalist during your hospital stay. If you have any questions about your discharge medications or the care you received while you were in the hospital after you are discharged, you can call the unit and asked to speak with the hospitalist on call if the hospitalist that took care of you is not available. Once you are discharged, your primary care physician will handle any further medical issues. Please note that NO REFILLS for any discharge medications will be authorized once you are discharged, as it is imperative that you return to your primary care physician (or establish a relationship with a primary care physician if you do not have one) for your aftercare needs so that they can reassess your need for medications and monitor your lab values.    Today   CHIEF COMPLAINT:   Chief Complaint  Patient presents with  . Fatigue    HISTORY OF PRESENT ILLNESS:  Hermoine Frevert  is a 59 y.o. female with a known history of osteomyelitis of the left heel currently receiving IV antibiotics, iron deficiency anemia, diabetes, congestive heart failure and essential hypertension who presents with above complaint. Home care health nurse to some labs and her hemoglobin was noted to be low so she was asked to come to the ER for further evaluation. Patient's main symptom is that she has fatigue for the past month. She denies any chest pain, shortness of breath, hematochezia. She does say that she has had dark-colored stools however she says she is on iron pills. She was scheduled for colonoscopy due to her anemia  however she is receiving hyperbaric oxygen for her left heel osteomyelitis so she is unable to obtain a colonoscopy for the next 6 weeks. The colonoscopy apparently was scheduled by her primary care physician.   VITAL SIGNS:  Blood pressure  117/53, pulse 73, temperature 98.3 F (36.8 C), temperature source Oral, resp. rate 18, height 5\' 8"  (1.727 m), weight 118.899 kg (262 lb 2 oz), SpO2 96 %.  I/O:   Intake/Output Summary (Last 24 hours) at 02/23/15 1254 Last data filed at 02/23/15 1002  Gross per 24 hour  Intake   2070 ml  Output   1300 ml  Net    770 ml    PHYSICAL EXAMINATION:   Constitutional: She is oriented to person, place, and time and well-developed, well-nourished, and in no distress. No distress.  HENT:  Head: Normocephalic and atraumatic.  Eyes: Pupils are equal, round, and reactive to light. Left eye exhibits no discharge.  Neck: Normal range of motion. Neck supple.  Cardiovascular: Normal rate and regular rhythm.  No murmur heard. Pulmonary/Chest: Effort normal and breath sounds normal. No respiratory distress. She has no wheezes. She has no rales.  Abdominal: Bowel sounds are normal. She exhibits no distension.  Musculoskeletal: Normal range of motion. She exhibits no edema. Left foot dressing present. Neurological: She is alert and oriented to person, place, and time. No cranial nerve deficit.  Skin: Skin is warm and dry.  Psychiatric: Affect normal.   DATA REVIEW:   CBC  Recent Labs Lab 02/23/15 0455  WBC 3.7  HGB 7.8*  HCT 24.9*  PLT 191    Chemistries   Recent Labs Lab 02/22/15 0500  02/23/15 0455  NA  --   --  135  K  --   --  4.0  CL  --   --  101  CO2  --   --  27  GLUCOSE  --   --  199*  BUN  --   --  20  CREATININE  --   < > 1.49*  CALCIUM  --   --  7.4*  MG 1.3*  --   --   < > = values in this interval not displayed.  Cardiac Enzymes No results for input(s): TROPONINI in the last 168 hours.  Microbiology Results  Results  for orders placed or performed in visit on 01/16/15  Urine culture     Status: None   Collection Time: 01/16/15  1:59 PM  Result Value Ref Range Status   Culture KLEBSIELLA PNEUMONIAE  Final   Colony Count >=100,000 COLONIES/ML  Final   Organism ID, Bacteria KLEBSIELLA PNEUMONIAE  Final      Susceptibility   Klebsiella pneumoniae -  (no method available)    AMPICILLIN  Resistant     AMOX/CLAVULANIC <=2 Sensitive     AMPICILLIN/SULBACTAM 4 Sensitive     PIP/TAZO <=4 Sensitive     IMIPENEM <=0.25 Sensitive     CEFAZOLIN <=4 Sensitive     CEFTRIAXONE <=1 Sensitive     CEFTAZIDIME <=1 Sensitive     CEFEPIME <=1 Sensitive     GENTAMICIN <=1 Sensitive     TOBRAMYCIN <=1 Sensitive     CIPROFLOXACIN <=0.25 Sensitive     LEVOFLOXACIN <=0.12 Sensitive     NITROFURANTOIN 32 Sensitive     TRIMETH/SULFA <=20 Sensitive      Management plans discussed with the patient, family and they are in agreement.  CODE STATUS: DNR  TOTAL TIME TAKING CARE OF THIS PATIENT: 40 minutes.    Vaughan Basta M.D on 02/24/2015 at 12:54 PM  Between 7am to 6pm - Pager - [phone]  After 6pm go to [email or web address] - password [email or web address] [facility]  [facility]  Office  [phone]  CC:  Primary care physician; [name], MD

## 2015-02-23 NOTE — Care Management (Addendum)
Anticipate PT will not discharge today as planned. US revealed DVT. Attending notified by patient nurse. Contacted Kristen at [facility] to inform of new DX.

## 2015-02-24 ENCOUNTER — Encounter: Payer: PPO | Admitting: Surgery

## 2015-02-24 ENCOUNTER — Telehealth: Payer: Self-pay

## 2015-02-24 ENCOUNTER — Encounter: Payer: Self-pay | Admitting: Cardiology

## 2015-02-24 LAB — CREATININE, SERUM
Creatinine, Ser: 1.53 mg/dL — ABNORMAL HIGH (ref 0.44–1.00)
GFR calc Af Amer: 41 mL/min — ABNORMAL LOW (ref >60)
GFR calc non Af Amer: 36 mL/min — ABNORMAL LOW (ref >60)

## 2015-02-24 LAB — GLUCOSE, CAPILLARY
GLUCOSE-CAPILLARY: 150 mg/dL — AB (ref 65–99)
Glucose-Capillary: 255 mg/dL — ABNORMAL HIGH (ref 65–99)

## 2015-02-24 LAB — TYPE AND SCREEN
ABO/RH(D): O POS
Antibody Screen: NEGATIVE
Unit division: 0

## 2015-02-24 LAB — CBC
HCT: 27.2 % — ABNORMAL LOW (ref 35.0–47.0)
Hemoglobin: 8.6 g/dL — ABNORMAL LOW (ref 12.0–16.0)
MCH: 23.5 pg — AB (ref 26.0–34.0)
MCHC: 31.6 g/dL — ABNORMAL LOW (ref 32.0–36.0)
MCV: 74.3 fL — ABNORMAL LOW (ref 80.0–100.0)
PLATELETS: 193 10*3/uL (ref 150–440)
RBC: 3.66 MIL/uL — ABNORMAL LOW (ref 3.80–5.20)
RDW: 19.9 % — ABNORMAL HIGH (ref 11.5–14.5)
WBC: 4.8 10*3/uL (ref 3.6–11.0)

## 2015-02-24 LAB — SURGICAL PATHOLOGY

## 2015-02-24 MED ORDER — APIXABAN 5 MG PO TABS
10.0000 mg | ORAL_TABLET | Freq: Two times a day (BID) | ORAL | Status: DC
  Administered 2015-02-24: 5 mg via ORAL
  Filled 2015-02-24: qty 2

## 2015-02-24 MED ORDER — APIXABAN 5 MG PO TABS: 10.0000 mg | ORAL_TABLET | Freq: Two times a day (BID) | ORAL | Status: DC

## 2015-02-24 MED ORDER — APIXABAN 5 MG PO TABS: 5.0000 mg | ORAL_TABLET | Freq: Two times a day (BID) | ORAL | Status: DC

## 2015-02-24 NOTE — Progress Notes (Signed)
PT Cancellation Note  Patient Details Name: [name] MRN: [id number] DOB: [birth year]   Cancelled Treatment:    Reason Eval/Treat Not Completed: Patient not medically ready.  Nursing reports giving pt 1st dose of Eliquis (after diagnosis of L brachial vein thrombosis) at about 1125am today.  PT spoke directly with MD Anselm Jungling who reported that pt is in therapeutic range within 1 hour after 1st dose of Eliquis and ok to see pt this afternoon for physical therapy.  MD Anselm Jungling also reported that it is ok to WB through L UE (DVT L UE) to use walker.  Will plan to see pt this afternoon per discussion with MD.   Leitha Bleak 02/24/2015, 11:34 AM Leitha Bleak, [place]

## 2015-02-24 NOTE — Evaluation (Signed)
Physical Therapy Evaluation Patient Details Name: [name] MRN: [id number] DOB: [birth year] Today's Date: 02/24/2015   History of Present Illness  Pt is a 59 y.o. female admitted with low Hg and fatigue; pt s/p 3 units PRBC transfusion.  Pt with known osteomyelitis L heel (wears orthotic shoe).  During admission, pt with acute respiratory failure.  Pt s/p endoscopy upper GI.  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  Clinical Impression  Currently pt demonstrates impairments with B LE strength, balance, and limitations with functional mobility.  Prior to admission, pt was requiring assist for B LE's getting in/out of bed and also to stand from all surfaces (except for lift chair); pt independent ambulating with RW once up.  Pt lives with her husband, daughter, and grandson and pt stays on main level of home (no steps to enter).  Currently pt is mod assist for B LE's in/out of bed and max assist to stand up to RW.  Pt would benefit from skilled PT to address above noted impairments and functional limitations.  Pt does require significant assist to stand (pt required assist prior to admission) but pt's husband able to demonstrate appropriate safety and physical assist required to stand pt up to RW.  Recommend pt discharge to home with 24/7 assist for all functional mobility when medically appropriate (pt reports having this assist available and pt and pt's family reports no questions or concerns for discharge home).     Follow Up Recommendations Home health PT;Supervision/Assistance - 24 hour    Equipment Recommendations       Recommendations for Other Services       Precautions / Restrictions Precautions Precautions: Fall Required Braces or Orthoses: Other Brace/Splint Other Brace/Splint: L orthotic shoe Restrictions Weight Bearing Restrictions: No      Mobility  Bed Mobility Overal bed mobility: Needs Assistance Bed Mobility: Supine  to Sit;Sit to Supine     Supine to sit: Mod assist Sit to supine: Mod assist   General bed mobility comments: assist for B LE's  Transfers Overall transfer level: Needs assistance Equipment used: Rolling walker (2 wheeled) Transfers: Sit to/from [facility] Sit to Stand: Max assist Stand pivot transfers: Min guard       General transfer comment: Pt's husband able to safely physically assist pt with standing up to/from RW.  Ambulation/Gait Ambulation/Gait assistance: Min guard Ambulation Distance (Feet): 60 Feet Assistive device: Rolling walker (2 wheeled)       General Gait Details: decreased cadence; decreased B step length/foot clearance/heelstrike; limited d/t fatigue and SOB.  L orthotic shoe.  Stairs            Wheelchair Mobility    Modified Rankin (Stroke Patients Only)       Balance                                             Pertinent Vitals/Pain Pain Assessment: No/denies pain  After activity, pt's HR 80 bpm and O2 90% on room air (increased to >93% within a couple minutes).    Home Living Family/patient expects to be discharged to:: Private residence Living Arrangements: Spouse/significant other (Pt's daughter and grandson) Available Help at Discharge: Family;Available 24 hours/day Type of Home: House Home Access: Level entry     Home Layout: Able to live on main level with  bedroom/bathroom;Two level Home Equipment: Walker - 2 wheels;Bedside commode;Shower seat;Wheelchair - manual      Prior Function Level of Independence: Needs assistance   Gait / Transfers Assistance Needed: Pt requires assist to get B LE's in/out of bed and assist to stand from all surfaces (except from lift chair).  Pt independent ambulating once standing with RW.  ADL's / Homemaking Assistance Needed: Pt requires assist to get LE's into/out of shower but then independent in shower.  Comments: Pt's husband present 24/7 and able to  physically assist pt with all transfers; pt's grandson and daughter also able to physically assist.     Hand Dominance        Extremity/Trunk Assessment   Upper Extremity Assessment: Overall WFL for tasks assessed           Lower Extremity Assessment: LLE deficits/detail;Generalized weakness   LLE Deficits / Details: 2+/5 L DF     Communication      Cognition Arousal/Alertness: Awake/alert Behavior During Therapy: WFL for tasks assessed/performed Overall Cognitive Status: Within Functional Limits for tasks assessed                      General Comments  Pt agreeable to PT session.  Pt sitting up in recliner chair (no alarm) with husband present beginning of session.    Exercises        Assessment/Plan    PT Assessment Patient needs continued PT services  PT Diagnosis Difficulty walking;Generalized weakness   PT Problem List Decreased strength;Decreased activity tolerance;Decreased balance;Decreased mobility  PT Treatment Interventions Gait training;Functional mobility training;Therapeutic activities;Therapeutic exercise;Patient/family education;Balance training   PT Goals (Current goals can be found in the Care Plan section) Acute Rehab PT Goals Patient Stated Goal: To go home PT Goal Formulation: With patient Time For Goal Achievement: 03/10/15 Potential to Achieve Goals: Good    Frequency Min 2X/week   Barriers to discharge        Co-evaluation               End of Session Equipment Utilized During Treatment: Gait belt Activity Tolerance: Patient limited by fatigue Patient left: in chair;with call bell/phone within reach;with family/visitor present Nurse Communication: Mobility status         Time: 1645-1720 PT Time Calculation (min) (ACUTE ONLY): 35 min   Charges:   PT Evaluation $Initial PT Evaluation Tier I: 1 Procedure     PT G CodesLeitha Bleak 03/11/2015, 3:52 PM Leitha Bleak, [place]

## 2015-02-24 NOTE — Care Management (Signed)
Spoke with MD concerning patient discharge plan. Dr Molli Hazard stated OK for PT eval today and also plan to DC today on Eliquis. Will run on insurance to establish patient ability to afford. Plan at this point is for Home with Home health.

## 2015-02-24 NOTE — Telephone Encounter (Signed)
Attempted to contact pt regarding discharge from [facility] on 02/24/15. Left message on vm asking pt to call back w/ any questions or concerns regarding discharge instructions or medications.  Advised her of appt w/ Dr. Fletcher Anon on 03/17/15 @ 3:30. Asked her to call back if she is unable to keep this appt.

## 2015-02-24 NOTE — Progress Notes (Signed)
[facility] [place] NAME: [name]    MR#:  [id number]  DATE OF BIRTH:  14-Jun-1956  SUBJECTIVE:  Had c/o left arm swelling yesterday- found DVT- , no complains today- feels fine. I spoke to Dr.Schiner and Dr.OH- they agreed and OK with her use of anticoagulation- Eliquis.  REVIEW OF SYSTEMS:    ROS  Constitutional: Negative for fever and chills.  Respiratory: Negative for cough, hemoptysis and shortness of breath.  Cardiovascular: Negative for chest pain, palpitations and orthopnea.  Gastrointestinal: Negative for heartburn, nausea, abdominal pain, diarrhea and constipation.  Skin: Negative for rash.  Neurological: Negative for headaches.  Psychiatric/Behavioral: Negative for depression.    DRUG ALLERGIES:  No Known Allergies  VITALS:  Blood pressure 132/80, pulse 69, temperature 98.1 F (36.7 C), temperature source Oral, resp. rate 18, height 5\' 8"  (1.727 m), weight 118.899 kg (262 lb 2 oz), SpO2 100 %.  PHYSICAL EXAMINATION:   Physical Exam  Constitutional: She is oriented to person, place, and time and well-developed, well-nourished, and in no distress. No distress.  HENT:  Head: Normocephalic and atraumatic.  Eyes: Pupils are equal, round, and reactive to light. Left eye exhibits no discharge.  Neck: Normal range of motion. Neck supple.  Cardiovascular: Normal rate and regular rhythm.   No murmur heard. Pulmonary/Chest: Effort normal and breath sounds normal. No respiratory distress. She has no wheezes. She has no rales.  Abdominal: Bowel sounds are normal. She exhibits no distension.  Musculoskeletal: Normal range of motion. She exhibits no edema.  Neurological: She is alert and oriented to person, place, and time. No cranial nerve deficit.  Skin: Skin is warm and dry.  Psychiatric: Affect normal.    LABORATORY PANEL:   CBC  Recent Labs Lab 02/24/15 0627  WBC 4.8  HGB 8.6*  HCT 27.2*  PLT 193    ------------------------------------------------------------------------------------------------------------------  Chemistries   Recent Labs Lab 02/22/15 0500  02/23/15 0455 02/24/15 0627  NA  --   --  135  --   K  --   --  4.0  --   CL  --   --  101  --   CO2  --   --  27  --   GLUCOSE  --   --  199*  --   BUN  --   --  20  --   CREATININE  --   < > 1.49* 1.53*  CALCIUM  --   --  7.4*  --   MG 1.3*  --   --   --   < > = values in this interval not displayed.  RADIOLOGY:  Dg Chest 1 View  02/18/2015    IMPRESSION: 1. Bibasilar atelectasis and small effusions increased mildly from prior. 2. PICC line at the junction of the subclavian vein and SVC.   Electronically Signed   By: Suzy Bouchard M.D.   On: 02/18/2015 14:55   ASSESSMENT AND PLAN:   59 year old female who was sent emergency department after receiving noticed that her hemoglobin was low.  1. Acute on chronic blood loss anemia:  patient was supposed to have an EGD and colonoscopy on 5/17, however due to hypoxia this was placed on hold.  Done on 02/22/15- EGD- no abnormalities, Colonoscopy- poor preparation- 2 polyps in ceacum and ascending colon. Advised repeat colonoscopy in 1 year. Patient has received 2 units of PRBCs. And 3rd on 02/23/15. Her aspirin and Plavix were on hold on  admission.   Now resumed ASA- no plavix as > 1 year of cardiac intervention and starting on eliquis.   on Protonix twice a day. Iron studies are consistent with iron deficiency anemia. She is already on iron supplementation for last 1 year, I gave one dose of IV iron- and advised to follow in cancer center with hematology.  cardiology has seen the patient in consultation. She denies any active bleeding.   GI resumed diet- and agreed with use of anticoagulation now- with DVT.  2. Left heel osteomyelitis:  continue on current antibiotics including vancomycin, Levaquin and doxycycline. Pharmacy is helping to dose his medications.   She was  following with wound clinic for last few months- and have a visiting nurse fo IV vancomycin.   Resume services on discharge.  3. Diabetes: Patient is an ADA diet, sliding scale insulin. 4. Coronary artery disease with coronary stent: Patient will be continued on outpatient medications. Plavix and aspirin were on hold due to problem #1. Resumed ASA.  5.Chronic systolic heart failure: Patient is not in acute exacerbation at this time, hour over she wasn't hypoxic. Her chest x-ray shows a small effusion and atelectasis. I have also ordered incentive spirometry. Echocardiogram reviewed- EF 50%. I appreciate cardiology consultation.   6. Hyponatremia: improved  7. Hypoxia: started incentive spirometry and  continue on torsemide.    Improved , and tolerated procedures .  8. DVT- left upper limb- bracheal vein   Spoke to Dr. Carroll Kinds- suggest anticoagulation for atleast 2 months.   Spoke to Dr. Candace Cruise- he approved use of Eliquis.  Discharge today.  CODE STATUS: DO NOT RESUSCITATE  TOTAL TIME TAKING CARE OF THIS PATIENT: 45 minutes.   POSSIBLE D/C in 1 DAY, DEPENDING ON CLINICAL CONDITION.   Vaughan Basta M.D on 02/24/2015 at 9:48 AM  Between 7am to 6pm - Pager - [phone] After 6pm go to [email or web address] - password [email or web address] [facility]  [facility]  Office  [phone]  CC: Primary care physician; [name], MD

## 2015-02-24 NOTE — Progress Notes (Signed)
ANTICOAGULATION CONSULT NOTE - Initial Consult  Pharmacy Consult for Eliquis Indication: DVT  No Known Allergies  Patient Measurements: Height: 5\' 8"  (172.7 cm) Weight: 262 lb 2 oz (118.899 kg) IBW/kg (Calculated) : 63.9   Vital Signs: Temp: 98.1 F (36.7 C) (05/20 0540) Temp Source: Oral (05/20 0540) BP: 132/80 mmHg (05/20 0540) Pulse Rate: 69 (05/20 0540)  Labs:  Recent Labs  02/23/15 0454 02/23/15 0455 02/24/15 0627  HGB  --  7.8* 8.6*  HCT  --  24.9* 27.2*  PLT  --  191 193  CREATININE 1.52* 1.49* 1.53*    Estimated Creatinine Clearance: 52.4 mL/min (by C-G formula based on Cr of 1.53).   Medical History: Past Medical History  Diagnosis Date  . Obesity, unspecified   . Unspecified vitamin D deficiency   . Pure hypercholesterolemia   . Neuropathy 2011  . Chicken pox   . Chronic systolic CHF (congestive heart failure)     a. mixed ICM & NICM; b. EF 20-25% by echo 07/2012, mid-dist 2/3 of LV sev HK/AK, mild MR. echo 10/2012: EF 30-35%, sev HK ant-septal & inf walls, GR1DD, mild MR, PASP 33. c. echo 02/2013: EF 30%, GR1DD, mild MR. echo 04/2014: EF 30%, Septal-lat dyssynchrony, global HK, inf AK, GR1DD, mild MR. d. echo 10/2014: EF50-55%, WM nl, GR1DD, septal mild paradox. e. echo 02/2015: EF 50-55%, wm   . Hypertension   . Anemia   . Automatic implantable cardioverter-defibrillator in situ     a. MDT CRT-D 06/2014, [id number] H    . Type II diabetes mellitus   . CAD (coronary artery disease)     a. cardiac cath 101/04/2012: PCI/DES to chronically occluded mLAD, consideration PCI to diag branch in 4 weeks.   Marland Kitchen History of blood transfusion ~ 2011    "plasma; had neuropathy; couldn't walk"  . OSA on CPAP     Moderate with AHI 23/hr and now on CPAP at 16cm H2O.  Her DME is [facility]  . LBBB (left bundle branch block)     Medications:  Scheduled:  . amiodarone  400 mg Oral BID  . apixaban  10 mg Oral BID  . [START ON 03/03/2015] apixaban  5 mg Oral BID  . carvedilol   25 mg Oral BID WC  . collagenase   Topical Daily  . doxycycline  100 mg Oral BID  . feeding supplement (RESOURCE BREEZE)  1 Container Oral TID WC  . ferrous sulfate  325 mg Oral BID WC  . gabapentin  600 mg Oral TID  . insulin aspart  0-9 Units Subcutaneous TID WC  . isosorbide mononitrate  30 mg Oral Daily  . levofloxacin  500 mg Oral Daily  . nortriptyline  50 mg Oral QHS  . nystatin   Topical TID  . pantoprazole (PROTONIX) IV  40 mg Intravenous Q12H  . potassium chloride  20 mEq Oral BID  . pravastatin  40 mg Oral QHS  . torsemide  40 mg Oral Daily  . vancomycin  1,500 mg Intravenous Q36H    Assessment: Patient is a 59 yo female with a left upper limb brachial vein DVT.    Goal of Therapy:  Monitor renal function and hemoglobin.    Plan:  Will order Eliquis 10 mg po twice daily for 7 days then start Eliquis 5 mg po twice daily.   Murrell Converse, PharmD Clinical Pharmacist 02/24/2015

## 2015-02-24 NOTE — Discharge Instructions (Signed)
Follow with Wound care clinic as per your previous schedule. Activities as tolerated. Diet- low sodium and low sugar diet. Notify MD office for any signs of bleeding, either vomiting up blood or blood in your stool

## 2015-02-24 NOTE — Progress Notes (Signed)
ANTIBIOTIC CONSULT NOTE - FOLLOW UP  Pharmacy Consult for Vancomycin/Levaquin/Doxycycline Indication: Osteomyelitis  No Known Allergies  Patient Measurements: Height: 5\' 8"  (172.7 cm) Weight: 262 lb 2 oz (118.899 kg) IBW/kg (Calculated) : 63.9   Vital Signs: Temp: 98.1 F (36.7 C) (05/20 0540) Temp Source: Oral (05/20 0540) BP: 132/80 mmHg (05/20 0540) Pulse Rate: 69 (05/20 0540)  Labs:  Recent Labs  02/22/15 0438 02/23/15 0454 02/23/15 0455 02/24/15 0627  WBC  --   --  3.7 4.8  HGB 7.3*  --  7.8* 8.6*  PLT  --   --  191 193  CREATININE 1.51* 1.52* 1.49* 1.53*   Estimated Creatinine Clearance: 52.4 mL/min (by C-G formula based on Cr of 1.53). No results for input(s): VANCOTROUGH, VANCOPEAK, VANCORANDOM, GENTTROUGH, GENTPEAK, GENTRANDOM, TOBRATROUGH, TOBRAPEAK, TOBRARND, AMIKACINPEAK, AMIKACINTROU, AMIKACIN in the last 72 hours.   Microbiology: No results found for this or any previous visit (from the past 720 hour(s)).  Anti-infectives    Start     Dose/Rate Route Frequency Ordered Stop   02/23/15 0000  vancomycin 1,500 mg in sodium chloride 0.9 % 500 mL     1,500 mg 250 mL/hr over 120 Minutes Intravenous Every 36 hours 02/23/15 1251     02/21/15 1000  levofloxacin (LEVAQUIN) tablet 500 mg     500 mg Oral Daily 02/21/15 0924     02/21/15 1000  levofloxacin (LEVAQUIN) tablet 250 mg     250 mg Oral  Once 02/21/15 0945 02/21/15 0957   02/19/15 1030  vancomycin (VANCOCIN) 1,500 mg in sodium chloride 0.9 % 500 mL IVPB     1,500 mg 250 mL/hr over 120 Minutes Intravenous Every 36 hours 02/19/15 0932     02/19/15 1000  levofloxacin (LEVAQUIN) tablet 250 mg  Status:  Discontinued     250 mg Oral Daily 02/18/15 0037 02/21/15 0924   02/18/15 1100  vancomycin (VANCOCIN) 1,500 mg in sodium chloride 0.9 % 500 mL IVPB  Status:  Discontinued     1,500 mg 250 mL/hr over 120 Minutes Intravenous Every 24 hours 02/17/15 1519 02/18/15 1324   02/18/15 0045  levofloxacin (LEVAQUIN)  tablet 500 mg     500 mg Oral  Once 02/18/15 0037 02/18/15 1409   02/17/15 1345  vancomycin (VANCOCIN) 1,500 mg in sodium chloride 0.9 % 500 mL IVPB  Status:  Discontinued     1,500 mg 250 mL/hr over 120 Minutes Intravenous Every 24 hours 02/17/15 1330 02/17/15 1441   02/17/15 1345  doxycycline (VIBRA-TABS) tablet 100 mg     100 mg Oral 2 times daily 02/17/15 1330     02/17/15 1345  levofloxacin (LEVAQUIN) tablet 500 mg  Status:  Discontinued     500 mg Oral Daily 02/17/15 1330 02/18/15 0037     Assessment: Pharmacy consulted to continue vancomycin regimen from outpatient. Patient is seen by Dr Ola Spurr [facility] outpatient for Osteomyelitis. Patient has CHF, and unknown baseline renal function. Was ordered vancomycin 1500mg  IV Q24H as an outpatient.     Goal of Therapy:  Vancomycin trough level 15-20 mcg/ml 5/16 PM vancomycin trough 17. Continue current regimen.  Plan:   Patient on vancomycin 1500mg  IV Q36H. Pt on levaquin 500mg  po daily. Continue doxycycline 100mg  PO Q12H . Will order next Vancomycin trough for 5/21 at 1000. Continue to evaluate Renal fxn.  Pharmacy will continue to follow.  Murrell Converse, PharmD Clinical Pharmacist 02/24/2015

## 2015-02-24 NOTE — Care Management (Signed)
Spoke with Pharmacist at [facility] and patient co pay for Eliquis will be $10.00 monthly. EScript already received today. Pt will work with patient today prior to discharge.

## 2015-02-25 ENCOUNTER — Encounter: Payer: Self-pay | Admitting: *Deleted

## 2015-02-25 NOTE — Progress Notes (Signed)
Patient discharged to home yesterday and taken home by her family. Patient went home with PICC line, dressing clean dry and intact. Patient seen by PT and family able to assist patient up to the chair with assistance. Patient to receive home health for IV antibiotics and wound care. Patient VSS, patient alert and oriented. upon discharge. No acute distress noted. Patient given follow up appointments and discharge instructions.

## 2015-02-27 ENCOUNTER — Encounter: Payer: PPO | Admitting: Surgery

## 2015-02-27 ENCOUNTER — Ambulatory Visit: Payer: PPO | Admitting: Family Medicine

## 2015-02-27 ENCOUNTER — Encounter: Payer: PPO | Admitting: Neurology

## 2015-02-28 ENCOUNTER — Encounter: Payer: PPO | Admitting: Surgery

## 2015-02-28 ENCOUNTER — Encounter: Payer: Self-pay | Admitting: Internal Medicine

## 2015-02-28 ENCOUNTER — Encounter: Payer: Self-pay | Admitting: Gastroenterology

## 2015-03-01 ENCOUNTER — Encounter: Payer: PPO | Admitting: Surgery

## 2015-03-02 ENCOUNTER — Ambulatory Visit: Payer: PPO | Admitting: Surgery

## 2015-03-02 ENCOUNTER — Encounter: Payer: PPO | Admitting: Surgery

## 2015-03-03 ENCOUNTER — Encounter: Payer: PPO | Admitting: Surgery

## 2015-03-03 DIAGNOSIS — L97529 Non-pressure chronic ulcer of other part of left foot with unspecified severity: Secondary | ICD-10-CM | POA: Diagnosis not present

## 2015-03-03 DIAGNOSIS — R0902 Hypoxemia: Secondary | ICD-10-CM | POA: Diagnosis not present

## 2015-03-03 DIAGNOSIS — M869 Osteomyelitis, unspecified: Secondary | ICD-10-CM | POA: Diagnosis not present

## 2015-03-03 DIAGNOSIS — G4733 Obstructive sleep apnea (adult) (pediatric): Secondary | ICD-10-CM | POA: Diagnosis not present

## 2015-03-03 DIAGNOSIS — I5022 Chronic systolic (congestive) heart failure: Secondary | ICD-10-CM | POA: Diagnosis not present

## 2015-03-03 DIAGNOSIS — E11621 Type 2 diabetes mellitus with foot ulcer: Secondary | ICD-10-CM | POA: Diagnosis not present

## 2015-03-03 DIAGNOSIS — D649 Anemia, unspecified: Secondary | ICD-10-CM | POA: Diagnosis not present

## 2015-03-03 DIAGNOSIS — I10 Essential (primary) hypertension: Secondary | ICD-10-CM | POA: Diagnosis not present

## 2015-03-03 DIAGNOSIS — X58XXXA Exposure to other specified factors, initial encounter: Secondary | ICD-10-CM | POA: Diagnosis not present

## 2015-03-03 DIAGNOSIS — E1142 Type 2 diabetes mellitus with diabetic polyneuropathy: Secondary | ICD-10-CM | POA: Diagnosis not present

## 2015-03-03 DIAGNOSIS — S0033XA Contusion of nose, initial encounter: Secondary | ICD-10-CM | POA: Diagnosis not present

## 2015-03-03 DIAGNOSIS — L97422 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 layer exposed: Secondary | ICD-10-CM | POA: Diagnosis present

## 2015-03-03 DIAGNOSIS — N289 Disorder of kidney and ureter, unspecified: Secondary | ICD-10-CM | POA: Diagnosis not present

## 2015-03-03 DIAGNOSIS — E785 Hyperlipidemia, unspecified: Secondary | ICD-10-CM | POA: Diagnosis not present

## 2015-03-03 NOTE — Progress Notes (Addendum)
HAGAR, ZANARDI ([id number]) Visit Report for 03/03/2015 Chief Complaint Document Details Patient Name: [name], [name] Date of Service: 03/03/2015 1:45 PM Medical Record Number: [id number] Patient Account Number: [id number] Date of Birth/Sex: December 04, 1955 (59 y.o. Female) Treating RN: Primary Care Physician: [name] Other Clinician: Referring Physician: [name] Treating Physician/Extender: BURNS, Charlean Sanfilippo in Treatment: 7 Information Obtained from: Patient Chief Complaint Patient presents to the wound care center for a consult due non healing wound 59 year old patient who comes with a history of an ulcer on the left second toe for at least about 4 months and a history on her left heel for about 3 weeks. I needed to get an MRI of her left foot but due to her defibrillator this could not be done and hence she is getting get a triple phase bone scan. 01/17/2015 -- she has developed a significant injury to the bridge of the nose where her CPAP machine was fitting. This is a painfull dark spot. Electronic Signature(s) Signed: 03/03/2015 2:40:06 PM By: Loletha Grayer MD Entered By: Loletha Grayer on 03/03/2015 14:30:27 [name] ([id number]) -------------------------------------------------------------------------------- Debridement Details Patient Name: [name] Date of Service: 03/03/2015 1:45 PM Medical Record Number: [id number] Patient Account Number: [id number] Date of Birth/Sex: [birth year] (59 y.o. Female) Treating RN: Primary Care Physician: [name] Other Clinician: Referring Physician: [name] Treating Physician/Extender: BURNS, Charlean Sanfilippo in Treatment: 7 Debridement Performed for Wound #1 Left Calcaneous Assessment: Performed By: Physician BURNS, Teressa Senter., MD Debridement: Debridement Pre-procedure Yes Verification/Time Out Taken: Start Time: 14:15 Pain Control: Lidocaine 4% Topical Solution Level:  Skin/Subcutaneous Tissue Total Area Debrided (L x 1.7 (cm) x 1.3 (cm) = 2.21 (cm) W): Tissue and other Viable, Non-Viable, Fat, Fibrin/Slough, Subcutaneous material debrided: Instrument: Curette Bleeding: Minimum Hemostasis Achieved: Pressure End Time: 14:16 Procedural Pain: 0 Post Procedural Pain: 0 Response to Treatment: Procedure was tolerated well Post Debridement Measurements of Total Wound Length: (cm) 1.7 Width: (cm) 1.3 Depth: (cm) 0.5 Volume: (cm) 0.868 Electronic Signature(s) Signed: 03/03/2015 2:40:06 PM By: Loletha Grayer MD Entered By: Loletha Grayer on 03/03/2015 14:30:18 [name] ([id number]) -------------------------------------------------------------------------------- HPI Details Patient Name: [name] Date of Service: 03/03/2015 1:45 PM Medical Record Number: [id number] Patient Account Number: [id number] Date of Birth/Sex: [birth year] (59 y.o. Female) Treating RN: Primary Care Physician: [name] Other Clinician: Referring Physician: [name] Treating Physician/Extender: BURNS, Charlean Sanfilippo in Treatment: 7 History of Present Illness HPI Description: 59 year old patient who is known to have diabetes for several years and generally has it under good control last hemoglobin A1c was 6.7 done 2 weeks ago. She has several other comorbidities including acute CHF, hypoxia, hyperlipidemia, systolic heart failure, myocardial infarction, coronary artery disease, iron deficiency anemia, renal insufficiency, essential hypertension, morbid obesity and obstructive sleep apnea. 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 In the past she's had a coronary angioplasty with stent in 2013, laparoscopic cholecystectomy in 2011, bilateral oophorectomy in April 2012 and she's had some lumbar disc surgery in 1996. She says she's had a lot of peripheral neuropathy and due to this she's had a ulcer  on her left second toe for a while. She had a dry eschar on her left heel and recently this was removed and she found that she had some infection and some drainage from there. She has minimal pain around the heel but no pain on any areas of her toes. she has not been on any antibiotics recently  and has known vascular problems in the recent past. 01/17/2015 left foot x-ray done on April 5 reveals there are destructive changes noted of the distal aspect of the proximal phalanx of the left second digit with extension into the adjacent proximal interphalangeal joint space. These findings suggest osteomyelitis with possible septic arthritis. I was able to make a call to Dr. Nevada Crane the radiologist, and had a detailed discussion with him regarding this x-ray. He noted that there were also some changes in the fifth phalanx on the left foot and this could have been chronic osteomyelitis too. The second toe on the left leg shows findings which cannot differentiate between an acute or chronic osteomyelitis and hence he has recommended a MRI with and without contrast. We will get this organized for her and in the meanwhile treat her with oral antibiotics which is susceptible to Bactrim. she was seen yesterday by her PCP Dr. Chaya Jan who put her on 3 antibiotics which include Levaquin and doxycycline and Bactrim. 01/24/2015 She is scheduled for a triple PHASE bone scan on 01/26/2015. She is still on antibiotics and otherwise doing well. 01/31/2015 Bone Imaging 3 phase study -- IMPRESSION:1. There is increased uptake on all 3 phases localizing to the left hindfoot region. This may be a manifestation of osteomyelitis. Correlation with exact site of nonhealing ulcer. 2. There is increased uptake localizing to the second phalanx of the left foot on the blood pool and delayed phase images. 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 Underlying infection  not excluded. I have personally discussed the x-ray studies with the radiologist Dr. Kerby Moors and after comparing all DESTINEY, DADAMO. ([id number]) her films he agrees that the left second phalanx does not have any definite underlying infection and this is now off a chronic arthropathy. There is a increased uptake in all 3 phases localized to the left hindfoot region and this is a manifestation of osteomyelitis. 02/07/2015 -- She was seen by the ID specialist Dr. Ola Spurr who reviewed her on my request recently. He has recommended a PICC line and IV vancomycin and oral ciprofloxacin for 4 weeks. She will be reviewed by him periodically. He also received several notes from Dr. Sonia Baller to help as documented all the requisites for hyperbaric oxygen therapy. 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 He also had a coronary angioplasty with stent placement in October 2013. Prior to that her 2-D echo showed EF of 35%. Her latest 2-D echo done on 11/01/2014 showed a LV EF of 50-55%. Systolic function and wall motion were within normal limits. Chest x-ray done in January 16, 2015 showed no acute findings. Last hemoglobin A1c done on 12/30/2014 was 6.7. CBC done on the same day was within normal limits, except for a hemoglobin being 8.7 and a hematocrit between 29%. She had arterial duplex study done in August 2015 which showed: No evidence of segmental lower extremity arterial disease at rest, bilaterally. Normal ABI's, bilaterally. Normal great toe pressures, bilaterally. With all the above in place I believe she is a very good candidate for hyperbaric oxygen therapy and I would recommend this with the usual protocol for 6 weeks. she is going to get her PICC line this Friday and the IV antibiotics and when to start. She is also starting on oral ciprofloxacin. 03/03/2015 - recently hospitalized for anemia. No source  identified. Started on amiodarone, contraindication to HBO. Reports left  heel feels better. Main complaint is new buttocks ulcers. No fever or chills. Minimal drainage. Electronic Signature(s) Signed: 03/03/2015 2:40:06 PM By: Loletha Grayer MD Entered By: Loletha Grayer on 03/03/2015 14:33:05 [name] ([id number]) -------------------------------------------------------------------------------- Physical Exam Details Patient Name: [name] Date of Service: 03/03/2015 1:45 PM Medical Record Number: [id number] Patient Account Number: [id number] Date of Birth/Sex: 12/01/1955 (59 y.o. Female) Treating RN: Primary Care Physician: [name] Other Clinician: Referring Physician: [name] Treating Physician/Extender: BURNS, Charlean Sanfilippo in Treatment: 7 Constitutional . Pulse regular. Respirations normal and unlabored. Afebrile. Marland Kitchen Respiratory WNL. No retractions.. Cardiovascular . Integumentary (Hair, Skin) .Marland Kitchen Neurological . Psychiatric Judgement and insight Intact.. Oriented times 3.. No evidence of depression, anxiety, or agitation.. Electronic Signature(s) Signed: 03/03/2015 2:40:06 PM By: Loletha Grayer MD Entered By: Loletha Grayer on 03/03/2015 14:35:21 [name] ([id number]) -------------------------------------------------------------------------------- Physician Orders Details Patient Name: [name] Date of Service: 03/03/2015 1:45 PM Medical Record Number: [id number] Patient Account Number: [id number] Date of Birth/Sex: 12-26-1955 (59 y.o. Female) Treating RN: Montey Hora Primary Care Physician: [name] Other Clinician: Referring Physician: [name] Treating Physician/Extender: BURNS, Charlean Sanfilippo in Treatment: 7 Verbal / Phone Orders: Yes Clinician: Montey Hora Read Back and Verified: Yes Diagnosis Coding ICD-10 Coding Code Description E11.621 Type 2 diabetes mellitus with foot ulcer L97.422  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 layer exposed L97.522 Non-pressure chronic ulcer of other part of left foot with fat layer exposed [id number] Chronic systolic (congestive) heart failure E11.42 Type 2 diabetes mellitus with diabetic polyneuropathy S00.33XA Contusion of nose, initial encounter 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 o May Shower, gently pat wound dry prior to applying new dressing. o May shower with protection. Wound #3 Nose o Clean wound with Normal Saline. o May Shower, gently pat wound dry prior to applying new dressing. o May shower with protection. Wound #4 Medial Coccyx o Clean wound with Normal Saline. o May Shower, gently pat wound dry prior to applying new dressing. o May shower with protection. Wound #5 Right Coccyx o Clean wound with Normal Saline. o May Shower, gently pat wound dry prior to applying new dressing. o May shower with protection. Wound #6 Left,Medial Upper Leg o Clean wound with Normal Saline. KACY, PROVO ([id number]) o May Shower, gently pat wound dry prior to applying new dressing. o May shower with protection. Anesthetic Wound #1 Left Calcaneous o Topical Lidocaine 4% cream applied to wound bed prior to debridement o Hurricaine Topical Anesthetic Spray applied to wound bed prior to debridement Wound #3 Nose o Topical Lidocaine 4% cream applied to wound bed prior to debridement o Hurricaine Topical Anesthetic Spray applied to wound bed prior to debridement Wound #4 Medial Coccyx o Topical Lidocaine 4% cream applied to wound bed prior to debridement o Hurricaine Topical Anesthetic Spray applied to wound bed prior to debridement Wound #5 Right Coccyx o Topical Lidocaine  4% cream applied to wound bed prior to debridement o Hurricaine Topical Anesthetic Spray applied to wound bed prior to debridement Wound #6 Left,Medial Upper Leg o Topical Lidocaine 4% cream applied to wound bed prior to debridement o Hurricaine Topical Anesthetic Spray applied to wound bed prior to debridement Primary Wound Dressing Wound #1 Left Calcaneous o Aquacel Ag o Other: - nystatin powder under  aquacel ag Wound #4 Medial Coccyx o Aquacel Ag o Other: - nystatin powder under aquacel ag Wound #5 Right Coccyx o Aquacel Ag o Other: - nystatin powder under aquacel ag Wound #6 Left,Medial Upper Leg o Aquacel Ag o Other: - nystatin powder under aquacel ag Wound #3 Nose o Other: - betadine paint Secondary Dressing Wound #1 Left Calcaneous o ABD pad - secure with underwear and tape if needed TESHAWN, BOJORQUEZ ([id number]) Wound #4 Medial Coccyx o ABD pad - secure with underwear and tape if needed Wound #5 Right Coccyx o ABD pad - secure with underwear and tape if needed Wound #6 Left,Medial Upper Leg o ABD pad - secure with underwear and tape if needed Dressing Change Frequency Wound #1 Left Calcaneous o Change dressing every day. Wound #3 Nose o Change dressing every day. Wound #4 Medial Coccyx o Change dressing every day. Wound #5 Right Coccyx o Change dressing every day. Wound #6 Left,Medial Upper Leg o Change dressing every day. Follow-up Appointments Wound #1 Left Calcaneous o Return Appointment in 1 week. Wound #3 Nose o Return Appointment in 1 week. Wound #4 Medial Coccyx o Return Appointment in 1 week. Wound #5 Right Coccyx o Return Appointment in 1 week. Wound #6 Left,Medial Upper Leg o Return Appointment in 1 week. Off-Loading Wound #1 Left Calcaneous o Open toe surgical shoe with peg assist. o Heel suspension boot to: - [employer] o Other: ZIA, LINDSKOG ([id number]) [place]  #1 Left [place] Visits - Advanced - Please have HHA assist with bathing o Home Health Nurse may visit PRN to address patientos wound care needs. o FACE TO FACE ENCOUNTER: MEDICARE and MEDICAID PATIENTS: I certify that this patient is under my care and that I had a face-to-face encounter that meets the physician face-to-face encounter requirements with this patient on this date. The encounter with the patient was in whole or in part for the following MEDICAL CONDITION: (primary reason for [place]) MEDICAL NECESSITY: I certify, that based on my findings, NURSING services are a medically necessary home health service. HOME BOUND STATUS: I certify that my clinical findings support that this patient is homebound (i.e., Due to illness or injury, pt requires aid of supportive devices such as crutches, cane, wheelchairs, walkers, the use of special transportation or the assistance of another person to leave their place of residence. There is a normal inability to leave the home and doing so requires considerable and taxing effort. Other absences are for medical reasons / religious services and are infrequent or of short duration when for other reasons). o If current dressing causes regression in wound condition, may D/C ordered dressing product/s and apply Normal Saline Moist Dressing daily until next [place] / Other MD appointment. [place] of regression in wound condition at [phone]. o Please direct any NON-WOUND related issues/requests for orders to patient's Primary Care Physician Wound #3 [place] Visits - Advanced - Please have HHA assist with bathing o Home Health Nurse may visit PRN to address patientos wound care needs. o FACE TO FACE ENCOUNTER: MEDICARE and MEDICAID PATIENTS: I certify that this patient is under my care and that I had a face-to-face encounter that meets the physician  face-to-face encounter requirements with this patient on this date. The encounter with the patient was in whole or in part for the following MEDICAL CONDITION: (primary reason for [place]) MEDICAL NECESSITY: I certify, that based on  my findings, NURSING services are a medically necessary home health service. HOME BOUND STATUS: I certify that my clinical findings support that this patient is homebound (i.e., Due to illness or injury, pt requires aid of supportive devices such as crutches, cane, wheelchairs, walkers, the use of special transportation or the assistance of another person to leave their place of residence. There is a normal inability to leave the home and doing so requires considerable and taxing effort. Other absences are for medical reasons / religious services and are infrequent or of short duration when for other reasons). o If current dressing causes regression in wound condition, may D/C ordered dressing product/s and apply Normal Saline Moist Dressing daily until next [place] / Other MD appointment. [place] of regression in wound condition at [phone]. o Please direct any NON-WOUND related issues/requests for orders to patient's Primary Care Physician Wound #4 [place] Visits - Advanced - Please have HHA assist with bathing o Home Health Nurse may visit PRN to address patientos wound care needs. o FACE TO FACE ENCOUNTER: MEDICARE and MEDICAID PATIENTS: I certify that this patient is under my care and that I had a face-to-face encounter that meets the physician face-to-face encounter requirements with this patient on this date. The encounter with the patient was in whole or in part for the following MEDICAL CONDITION: (primary reason for [place]) LOUCINDA, WORKU ([id number]) MEDICAL NECESSITY: I certify, that based on my findings, NURSING services are a medically necessary home  health service. HOME BOUND STATUS: I certify that my clinical findings support that this patient is homebound (i.e., Due to illness or injury, pt requires aid of supportive devices such as crutches, cane, wheelchairs, walkers, the use of special transportation or the assistance of another person to leave their place of residence. There is a normal inability to leave the home and doing so requires considerable and taxing effort. Other absences are for medical reasons / religious services and are infrequent or of short duration when for other reasons). o If current dressing causes regression in wound condition, may D/C ordered dressing product/s and apply Normal Saline Moist Dressing daily until next [place] / Other MD appointment. [place] of regression in wound condition at [phone]. o Please direct any NON-WOUND related issues/requests for orders to patient's Primary Care Physician Wound #5 Right [place] Visits - Advanced - Please have HHA assist with bathing o Home Health Nurse may visit PRN to address patientos wound care needs. o FACE TO FACE ENCOUNTER: MEDICARE and MEDICAID PATIENTS: I certify that this patient is under my care and that I had a face-to-face encounter that meets the physician face-to-face encounter requirements with this patient on this date. The encounter with the patient was in whole or in part for the following MEDICAL CONDITION: (primary reason for [place]) MEDICAL NECESSITY: I certify, that based on my findings, NURSING services are a medically necessary home health service. HOME BOUND STATUS: I certify that my clinical findings support that this patient is homebound (i.e., Due to illness or injury, pt requires aid of supportive devices such as crutches, cane, wheelchairs, walkers, the use of special transportation or the assistance of another person to leave their place of residence. There is  a normal inability to leave the home and doing so requires considerable and taxing effort. Other absences are for medical reasons / religious services and are infrequent or of short duration  when for other reasons). o If current dressing causes regression in wound condition, may D/C ordered dressing product/s and apply Normal Saline Moist Dressing daily until next [place] / Other MD appointment. [place] of regression in wound condition at [phone]. o Please direct any NON-WOUND related issues/requests for orders to patient's Primary Care Physician Wound #6 Left,Medial Upper Leg o [place] Visits - Advanced - Please have HHA assist with bathing o Home Health Nurse may visit PRN to address patientos wound care needs. o FACE TO FACE ENCOUNTER: MEDICARE and MEDICAID PATIENTS: I certify that this patient is under my care and that I had a face-to-face encounter that meets the physician face-to-face encounter requirements with this patient on this date. The encounter with the patient was in whole or in part for the following MEDICAL CONDITION: (primary reason for [place]) MEDICAL NECESSITY: I certify, that based on my findings, NURSING services are a medically necessary home health service. HOME BOUND STATUS: I certify that my clinical findings support that this patient is homebound (i.e., Due to illness or injury, pt requires aid of supportive devices such as crutches, cane, wheelchairs, walkers, the use of special transportation or the assistance of another person to leave their place of residence. There is a normal inability to leave the home and doing so requires considerable and taxing effort. Other absences are for medical reasons / religious services and are infrequent or of short duration when for other reasons). YMANI, THAEMERT ([id number]) o If current dressing causes regression in wound condition, may D/C ordered  dressing product/s and apply Normal Saline Moist Dressing daily until next [place] / Other MD appointment. [place] of regression in wound condition at [phone]. o Please direct any NON-WOUND related issues/requests for orders to patient's Primary Care Physician Hyperbaric Oxygen Therapy Wound #1 Left Calcaneous o Indication: - osteomyelitis o If appropriate for treatment, begin HBOT per protocol: o 2.0 ATA for 90 Minutes without Air Breaks o One treatment per day (delivered Monday through Friday unless otherwise specified in Special Instructions below): o Total # of Treatments: - 40 o Finger stick Blood Glucose Pre- and Post- HBOT Treatment. o Follow Hyperbaric Oxygen Glycemia Protocol HBO Contraindications Wound #1 Left Calcaneous - Left Lower Extremity o HBO contraindications of hyperbaric oxygen therapy were reviewed and the patient found to have no untreated pneumothorax or history of spontaneous pneumothorax. o HBO contraindications of hyperbaric oxygen therapy were reviewed and the patient found to have no history of medications such as Bleomycin, Adriamycin, disulfiram, cisplatin and sulfamylon and is not currently receiving any chemotherapy. o HBO contraindications of hyperbaric oxygen therapy were reviewed and the patient found to have no Upper respiratory infection and chronic sinusitis. o HBO contraindications of hyperbaric oxygen therapy were reviewed and the patient found to have no history of retinal surgery proceeding 6 weeks or intraocular gas o HBO contraindications of hyperbaric oxygen therapy were reviewed and the patient found to have no history seizure disorder or any anticonvulsant medication. o HBO contraindications of hyperbaric oxygen therapy were reviewed and the patient found to have no septicemia with CO2 retention. o HBO contraindications of hyperbaric oxygen therapy were reviewed and the  patient found to have no fever greater than 100 degrees. o HBO contraindications of hyperbaric oxygen therapy were reviewed and the patient found to have no pregnancy noted. o HBO contraindications of hyperbaric oxygen therapy were reviewed and the patient found to have no medications such as  steroids or narcotics or Phenergan. Medications-please add to medication list. Wound #1 Left Calcaneous - Left Lower Extremity o P.O. Antibiotics - start antibiotics ordered by Dr. Littie Deeds INTERVENTIONS PROTOCOL PRE-HBO GLYCEMIA INTERVENTIONS ACTION INTERVENTION 1 PHOENIX, WILKIE. ([id number]) Obtain pre-HBO capillary blood glucose (ensure physician order is in chart). A. Notify HBO physician and await physician orders. 2 If result is 70 mg/dl or below: B. If the result meets the hospital definition of a critical result, follow hospital policy. A. Give patient an 8 ounce Glucerna Shake, an 8 ounce Ensure, or 8 ounces of a Glucerna/Ensure equivalent dietary supplement*. B. Wait 30 minutes. If result is 71 mg/dl to 130 mg/dl: C. Retest patientos capillary blood glucose (CBG). D. If result greater than or equal to 110 mg/dl, proceed with HBO. If result less than 110 mg/dl, notify HBO physician and consider holding HBO. If result is 131 mg/dl to 249 mg/dl: A. Proceed with HBO. A. Notify HBO physician and await physician orders. B. It is recommended to hold HBO and do blood/urine ketone If result is 250 mg/dl or greater: testing. C. If the result meets the hospital definition of a critical result, follow hospital policy. POST-HBO GLYCEMIA INTERVENTIONS ACTION INTERVENTION Obtain post HBO capillary blood 1 glucose (ensure physician order is in chart). A. Notify HBO physician and await physician orders. 2 If result is 70 mg/dl or below: B. If the result meets the hospital definition of a critical result, follow hospital policy. If result is 71 mg/dl to 100  mg/dl: A. Give patient an 8 ounce Glucerna Shake, an 8 ounce Ensure, or 8 ounces of a Glucerna/Ensure equivalent dietary supplement*. B. Wait 15 minutes for symptoms of hypoglycemia (i.e. nervousness, anxiety, sweating, chills, clamminess, Rison, Mava S. ([id number]) irritability, confusion, tachycardia or dizziness). C. If patient asymptomatic, discharge patient. If patient symptomatic, repeat capillary blood glucose (CBG) and notify HBO physician. If result is 101 mg/dl to 249 mg/dl: A. Discharge patient. A. Notify HBO physician and await physician orders. B. It is recommended to do If result is 250 mg/dl or greater: blood/urine ketone testing. C. If the result meets the hospital definition of a critical result, follow hospital policy. *Juice or candies are NOT equivalent products. If patient refuses the Glucerna or Ensure, please consult the hospital dietitian for an appropriate substitute. Electronic Signature(s) Signed: 03/03/2015 4:13:04 PM By: Montey Hora Signed: 03/07/2015 8:02:21 AM By: Loletha Grayer MD Entered By: Montey Hora on 03/03/2015 14:51:26 [name] ([id number]) -------------------------------------------------------------------------------- Problem List Details Patient Name: [name] Date of Service: 03/03/2015 1:45 PM Medical Record Number: [id number] Patient Account Number: [id number] Date of Birth/Sex: 24-Jul-1956 (59 y.o. 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 01/10/2015 Yes layer exposed L97.522 Non-pressure chronic ulcer of other part of left foot with fat 01/10/2015 Yes layer  exposed [id number] Chronic systolic (congestive) heart failure 01/10/2015 Yes E11.42 Type 2 diabetes mellitus with diabetic polyneuropathy 01/10/2015 Yes S00.33XA Contusion of nose, initial encounter 01/17/2015 Yes 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, left ankle and foot 02/13/2015 Yes 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, stage 3 03/03/2015 Yes L89.323 Pressure ulcer of left buttock, stage 3 03/03/2015 Yes Inactive Problems Resolved Problems KELLYMARIE, MORMAN ([id number]) Electronic Signature(s) Signed: 03/03/2015 2:40:06 PM By: Kathreen Cosier,  Thayer Jew MD Entered By: Loletha Grayer on 03/03/2015 14:29:11 [name] ([id number]) -------------------------------------------------------------------------------- Progress Note Details Patient Name: [name] Date of Service: 03/03/2015 1:45 PM Medical Record Number: [id number] Patient Account Number: [id number] Date of Birth/Sex: 02-Jan-1956 (59 y.o. Female) Treating RN: Primary Care Physician: [name] Other Clinician: Referring Physician: [name] Treating Physician/Extender: BURNS III, Charlean Sanfilippo in Treatment: 7 Subjective Chief Complaint Information obtained from Patient Patient presents to the wound care center for a consult due non healing wound 59 year old patient who comes with a history of an ulcer on the left second toe for at least about 4 months and a history on her left heel for about 3 weeks. I needed to get an MRI of her left foot but due to her defibrillator this could not be done and hence she is getting get a triple phase bone scan. 01/17/2015 -- she has developed a significant injury to the bridge of the nose where her CPAP machine was fitting. This is a painfull dark spot. History of Present Illness (HPI) 59 year old patient who is known to have diabetes for several years and generally has it under good control last hemoglobin A1c was 6.7 done 2 weeks ago. She has several other comorbidities  including acute CHF, hypoxia, hyperlipidemia, systolic heart failure, myocardial infarction, coronary artery disease, iron deficiency anemia, renal insufficiency, essential hypertension, morbid obesity and obstructive sleep apnea. 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 In the past she's had a coronary angioplasty with stent in 2013, laparoscopic cholecystectomy in 2011, bilateral oophorectomy in April 2012 and she's had some lumbar disc surgery in 1996. She says she's had a lot of peripheral neuropathy and due to this she's had a ulcer on her left second toe for a while. She had a dry eschar on her left heel and recently this was removed and she found that she had some infection and some drainage from there. She has minimal pain around the heel but no pain on any areas of her toes. she has not been on any antibiotics recently and has known vascular problems in the recent past. 01/17/2015 left foot x-ray done on April 5 reveals there are destructive changes noted of the distal aspect of the proximal phalanx of the left second digit with extension into the adjacent proximal interphalangeal joint space. These findings suggest osteomyelitis with possible septic arthritis. I was able to make a call to Dr. Nevada Crane the radiologist, and had a detailed discussion with him regarding this x-ray. He noted that there were also some changes in the fifth phalanx on the left foot and this could have been chronic osteomyelitis too. The second toe on the left leg shows findings which cannot differentiate between an acute or chronic osteomyelitis and hence he has recommended a MRI with and without contrast. We will get this organized for her and in the meanwhile treat her with oral antibiotics which is susceptible to Bactrim. she was seen yesterday by her PCP Dr. Chaya Jan who put her on 3 antibiotics which include Levaquin and doxycycline and  Bactrim. 01/24/2015 She is scheduled for a triple PHASE bone scan on 01/26/2015. She is still on antibiotics and otherwise doing well. TEMERA, HOHNER ([id number]) 01/31/2015 Bone Imaging 3 phase study -- IMPRESSION:1. There is increased uptake on all 3 phases localizing to the left hindfoot region. This may be a manifestation of osteomyelitis. Correlation with exact site of nonhealing ulcer. 2. There is increased uptake localizing  to the second phalanx of the left foot on the blood pool and delayed phase images. 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 Underlying infection not excluded. I have personally discussed the x-ray studies with the radiologist Dr. Kerby Moors and after comparing all her films he agrees that the left second phalanx does not have any definite underlying infection and this is now off a chronic arthropathy. There is a increased uptake in all 3 phases localized to the left hindfoot region and this is a manifestation of osteomyelitis. 02/07/2015 -- She was seen by the ID specialist Dr. Ola Spurr who reviewed her on my request recently. He has recommended a PICC line and IV vancomycin and oral ciprofloxacin for 4 weeks. She will be reviewed by him periodically. He also received several notes from Dr. Sonia Baller to help as documented all the requisites for hyperbaric oxygen therapy. 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 He also had a coronary angioplasty with stent placement in October 2013. Prior to that her 2-D echo showed EF of 35%. Her latest 2-D echo done on 11/01/2014 showed a LV EF of 50-55%. Systolic function and wall motion were within normal limits. Chest x-ray done in January 16, 2015 showed no acute findings. Last hemoglobin A1c done on 12/30/2014 was 6.7. CBC done on the same day was within normal limits, except for a hemoglobin  being 8.7 and a hematocrit between 29%. She had arterial duplex study done in August 2015 which showed: No evidence of segmental lower extremity arterial disease at rest, bilaterally. Normal ABI's, bilaterally. Normal great toe pressures, bilaterally. With all the above in place I believe she is a very good candidate for hyperbaric oxygen therapy and I would recommend this with the usual protocol for 6 weeks. she is going to get her PICC line this Friday and the IV antibiotics and when to start. She is also starting on oral ciprofloxacin. 03/03/2015 - recently hospitalized for anemia. No source identified. Started on amiodarone, contraindication to HBO. Reports left heel feels better. Main complaint is new buttocks ulcers. No fever or chills. Minimal drainage. Objective REBBA, BRISTOW ([id number]) Constitutional Pulse regular. Respirations normal and unlabored. Afebrile. Vitals Time Taken: 2:00 PM, Height: 68 in, Weight: 257 lbs, BMI: 39.1, Temperature: 98.2 F, Pulse: 83 bpm, Respiratory Rate: 18 breaths/min, Blood Pressure: 138/54 mmHg. Respiratory WNL. No retractions.Marland Kitchen Psychiatric Judgement and insight Intact.. Oriented times 3.. No evidence of depression, anxiety, or agitation.. Integumentary (Hair, Skin) Wound #1 status is Open. Original cause of wound was Gradually Appeared. The wound is located on the Left Calcaneous. The wound measures 1.7cm length x 1.3cm width x 0.4cm depth; 1.736cm^2 area and 0.694cm^3 volume. The wound is limited to skin breakdown. There is no tunneling or undermining noted. There is a large amount of serosanguineous drainage noted. The wound margin is distinct with the outline attached to the wound base. There is medium (34-66%) pink granulation within the wound bed. There is a medium (34-66%) amount of necrotic tissue within the wound bed including Eschar and Adherent Slough. The periwound skin appearance exhibited: Maceration, Moist. The periwound skin  appearance did not exhibit: Callus, Crepitus, Excoriation, Fluctuance, Friable, Induration, Localized Edema, Rash, Scarring, Dry/Scaly, Atrophie Blanche, Cyanosis, Ecchymosis, Hemosiderin Staining, Mottled, Pallor, Rubor, Erythema. Periwound temperature was noted as No Abnormality. The periwound has tenderness on palpation. Wound #2 status is Healed - Epithelialized. Original cause of wound was Gradually Appeared.  The wound is located on the Dorsal Toe Second. The wound measures 0cm length x 0cm width x 0cm depth; 0cm^2 area and 0cm^3 volume. Wound #3 status is Open. Original cause of wound was Pressure Injury. The wound is located on the Nose. The wound measures 1.7cm length x 0.7cm width x 0.1cm depth; 0.935cm^2 area and 0.093cm^3 volume. The wound is limited to skin breakdown. There is no tunneling or undermining noted. There is a none present amount of drainage noted. The wound margin is indistinct and nonvisible. There is no granulation within the wound bed. There is a large (67-100%) amount of necrotic tissue within the wound bed including Eschar. The periwound skin appearance exhibited: Dry/Scaly. The periwound skin appearance did not exhibit: Callus, Crepitus, Excoriation, Fluctuance, Friable, Induration, Localized Edema, Rash, Scarring, Maceration, Moist, Atrophie Blanche, Cyanosis, Ecchymosis, Hemosiderin Staining, Mottled, Pallor, Rubor, Erythema. Periwound temperature was noted as No Abnormality. Wound #4 status is Open. Original cause of wound was Pressure Injury. The wound is located on the Medial Coccyx. The wound measures 2.3cm length x 3cm width x 0.1cm depth; 5.419cm^2 area and 0.542cm^3 volume. The wound is limited to skin breakdown. There is no tunneling or undermining noted. There is a medium amount of sanguinous drainage noted. The wound margin is flat and intact. There is large (67-100%) red granulation within the wound bed. There is a small (1-33%) amount of necrotic  tissue within the wound bed including Adherent Slough. The periwound skin appearance did not exhibit: Callus, KAMARIYAH, DELIRA. ([id number]) Crepitus, Excoriation, Fluctuance, Friable, Induration, Localized Edema, Rash, Scarring, Dry/Scaly, Maceration, Moist, Atrophie Blanche, Cyanosis, Ecchymosis, Hemosiderin Staining, Mottled, Pallor, Rubor, Erythema. Periwound temperature was noted as No Abnormality. Wound #5 status is Open. Original cause of wound was Pressure Injury. The wound is located on the Right Coccyx. The wound measures 0.5cm length x 0.5cm width x 0.1cm depth; 0.196cm^2 area and 0.02cm^3 volume. The wound is limited to skin breakdown. There is no tunneling or undermining noted. There is a small amount of sanguinous drainage noted. The wound margin is flat and intact. There is large (67-100%) red granulation within the wound bed. There is no necrotic tissue within the wound bed. The periwound skin appearance did not exhibit: Callus, Crepitus, Excoriation, Fluctuance, Friable, Induration, Localized Edema, Rash, Scarring, Dry/Scaly, Maceration, Moist, Atrophie Blanche, Cyanosis, Ecchymosis, Hemosiderin Staining, Mottled, Pallor, Rubor, Erythema. Periwound temperature was noted as No Abnormality. Wound #6 status is Open. Original cause of wound was Pressure Injury. The wound is located on the Left,Medial Upper Leg. The wound measures 4.4cm length x 5cm width x 0.1cm depth; 17.279cm^2 area and 1.728cm^3 volume. The wound is limited to skin breakdown. There is no tunneling or undermining noted. There is a medium amount of sanguinous drainage noted. The wound margin is flat and intact. There is large (67-100%) red granulation within the wound bed. There is a small (1-33%) amount of necrotic tissue within the wound bed including Adherent Slough. The periwound skin appearance did not exhibit: Callus, Crepitus, Excoriation, Fluctuance, Friable, Induration, Localized Edema, Rash, Scarring,  Dry/Scaly, Maceration, Moist, Atrophie Blanche, Cyanosis, Ecchymosis, Hemosiderin Staining, Mottled, Pallor, Rubor, Erythema. Periwound temperature was noted as No Abnormality. 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  S00.33XA - Contusion of nose, initial encounter M86.372 - Chronic multifocal osteomyelitis, left ankle and foot L89.313 - Pressure ulcer of right buttock, stage 3 L89.323 - Pressure ulcer of left buttock, stage 3 Diagnoses ICD-10 E11.621: Type 2 diabetes mellitus with foot ulcer L97.422: 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 layer exposed L97.522: Non-pressure chronic ulcer of other part of left foot with fat layer exposed [id number]: Chronic systolic (congestive) heart failure E11.42: Type 2 diabetes mellitus with diabetic polyneuropathy S00.33XA: Contusion of nose, initial encounter BANESSA, CRINCOLI ([id number]) [phone]: Chronic multifocal osteomyelitis, left ankle and foot L89.313: Pressure ulcer of right buttock, stage 3 L89.323: Pressure ulcer of left buttock, stage 3 L heel ulcer, [place] 3. Multiple perineal pressure ulcers. Procedures Wound #1 Wound #1 is a Diabetic Wound/Ulcer of the Lower Extremity located on the Left Calcaneous . There was a Skin/Subcutaneous Tissue Debridement [id number]) debridement with total area of 2.21 sq cm performed by BURNS, Teressa Senter., MD. with the following instrument(s): Curette to remove Viable and Non-Viable tissue/material including Fat, Fibrin/Slough, and Subcutaneous after achieving pain control using Lidocaine 4% Topical Solution. A time out was conducted prior to the start of  the procedure. A Minimum amount of bleeding was controlled with Pressure. The procedure was tolerated well with a pain level of 0 throughout and a pain level of 0 following the procedure. Post Debridement Measurements: 1.7cm length x 1.3cm width x 0.5cm depth; 0.868cm^3 volume. Plan Wound Cleansing: Wound #1 Left Calcaneous: Clean wound with Normal Saline. May Shower, gently pat wound dry prior to applying new dressing. May shower with protection. Wound #3 Nose: Clean wound with Normal Saline. May Shower, gently pat wound dry prior to applying new dressing. May shower with protection. Wound #4 Medial Coccyx: Clean wound with Normal Saline. May Shower, gently pat wound dry prior to applying new dressing. May shower with protection. Wound #5 Right Coccyx: Clean wound with Normal Saline. May Shower, gently pat wound dry prior to applying new dressing. May shower with protection. Wound #6 Left,Medial Upper Leg: Clean wound with Normal Saline. GWENDOLIN, PINEAULT ([id number]) May Shower, gently pat wound dry prior to applying new dressing. May shower with protection. Anesthetic: Wound #1 Left Calcaneous: Topical Lidocaine 4% cream applied to wound bed prior to debridement Hurricaine Topical Anesthetic Spray applied to wound bed prior to debridement Wound #3 Nose: Topical Lidocaine 4% cream applied to wound bed prior to debridement Hurricaine Topical Anesthetic Spray applied to wound bed prior to debridement Wound #4 Medial Coccyx: Topical Lidocaine 4% cream applied to wound bed prior to debridement Hurricaine Topical Anesthetic Spray applied to wound bed prior to debridement Wound #5 Right Coccyx: Topical Lidocaine 4% cream applied to wound bed prior to debridement Hurricaine Topical Anesthetic Spray applied to wound bed prior to debridement Wound #6 Left,Medial Upper Leg: Topical Lidocaine 4% cream applied to wound bed prior to debridement Hurricaine Topical Anesthetic Spray  applied to wound bed prior to debridement Primary Wound Dressing: Wound #1 Left Calcaneous: Aquacel Ag Other: - nystatin powder under aquacel ag Wound #4 Medial Coccyx: Aquacel Ag Other: - nystatin powder under aquacel ag Wound #5 Right Coccyx: Aquacel Ag Other: - nystatin powder under aquacel ag Wound #6 Left,Medial Upper Leg: Aquacel Ag Other: - nystatin powder under aquacel ag Wound #3 Nose: Other: - betadine paint Secondary Dressing: Wound #1 Left Calcaneous: ABD pad - secure with underwear and tape if needed Wound #4 Medial Coccyx: ABD pad - secure with underwear and tape if  needed Wound #5 Right Coccyx: ABD pad - secure with underwear and tape if needed Wound #6 Left,Medial Upper Leg: ABD pad - secure with underwear and tape if needed Dressing Change Frequency: Wound #1 Left Calcaneous: Change dressing every day. Wound #3 Nose: Change dressing every day. Wound #4 Medial Coccyx: Change dressing every day. Wound #5 Right Coccyx: Change dressing every day. ALIYAH, HARTFORD ([id number]) Wound #6 Left,Medial Upper Leg: Change dressing every day. Follow-up Appointments: Wound #1 Left Calcaneous: Return Appointment in 1 week. Wound #3 Nose: Return Appointment in 1 week. Wound #4 Medial Coccyx: Return Appointment in 1 week. Wound #5 Right Coccyx: Return Appointment in 1 week. Wound #6 Left,Medial Upper Leg: Return Appointment in 1 week. Off-Loading: Wound #1 Left Calcaneous: Open toe surgical shoe with peg assist. Heel suspension boot to: - Sage boots Other: Home Health: Wound #1 Left Calcaneous: Continue Home Health Visits - [facility] - Please have HHA assist with bathing Home Health Nurse may visit PRN to address patient s wound care needs. FACE TO FACE ENCOUNTER: MEDICARE and MEDICAID PATIENTS: I certify that this patient is under my care and that I had a face-to-face encounter that meets the physician face-to-face encounter requirements with this patient  on this date. The encounter with the patient was in whole or in part for the following MEDICAL CONDITION: (primary reason for [place]) MEDICAL NECESSITY: I certify, that based on my findings, NURSING services are a medically necessary home health service. HOME BOUND STATUS: I certify that my clinical findings support that this patient is homebound (i.e., Due to illness or injury, pt requires aid of supportive devices such as crutches, cane, wheelchairs, walkers, the use of special transportation or the assistance of another person to leave their place of residence. There is a normal inability to leave the home and doing so requires considerable and taxing effort. Other absences are for medical reasons / religious services and are infrequent or of short duration when for other reasons). If current dressing causes regression in wound condition, may D/C ordered dressing product/s and apply Normal Saline Moist Dressing daily until next [place] / Other MD appointment. [place] of regression in wound condition at [phone]. Please direct any NON-WOUND related issues/requests for orders to patient's Primary Care Physician Wound #3 Nose: [place] Visits - [facility] - Please have HHA assist with bathing Home Health Nurse may visit PRN to address patient s wound care needs. FACE TO FACE ENCOUNTER: MEDICARE and MEDICAID PATIENTS: I certify that this patient is under my care and that I had a face-to-face encounter that meets the physician face-to-face encounter requirements with this patient on this date. The encounter with the patient was in whole or in part for the following MEDICAL CONDITION: (primary reason for [place]) MEDICAL NECESSITY: I certify, that based on my findings, NURSING services are a medically necessary home health service. HOME BOUND STATUS: I certify that my clinical findings support that this patient is homebound (i.e., Due  to illness or injury, pt requires aid of supportive devices such as crutches, cane, wheelchairs, walkers, the use of special transportation or the assistance of another person to leave their place of residence. There is a normal inability to leave the home and doing so requires considerable and taxing effort. Other absences are for medical reasons / religious services and are infrequent or of short duration when for other reasons). If current dressing causes regression in wound condition, may D/C ordered dressing product/s and  apply Normal Saline Moist Dressing daily until next [place] / Other MD appointment. Notify Wound JOBY, SEIFFERT ([id number]) [place] of regression in wound condition at [phone]. Please direct any NON-WOUND related issues/requests for orders to patient's Primary Care Physician Wound #4 Medial Coccyx: [place] Visits - [facility] - Please have HHA assist with bathing Home Health Nurse may visit PRN to address patient s wound care needs. FACE TO FACE ENCOUNTER: MEDICARE and MEDICAID PATIENTS: I certify that this patient is under my care and that I had a face-to-face encounter that meets the physician face-to-face encounter requirements with this patient on this date. The encounter with the patient was in whole or in part for the following MEDICAL CONDITION: (primary reason for [place]) MEDICAL NECESSITY: I certify, that based on my findings, NURSING services are a medically necessary home health service. HOME BOUND STATUS: I certify that my clinical findings support that this patient is homebound (i.e., Due to illness or injury, pt requires aid of supportive devices such as crutches, cane, wheelchairs, walkers, the use of special transportation or the assistance of another person to leave their place of residence. There is a normal inability to leave the home and doing so requires considerable and taxing effort. Other absences  are for medical reasons / religious services and are infrequent or of short duration when for other reasons). If current dressing causes regression in wound condition, may D/C ordered dressing product/s and apply Normal Saline Moist Dressing daily until next [place] / Other MD appointment. [place] of regression in wound condition at [phone]. Please direct any NON-WOUND related issues/requests for orders to patient's Primary Care Physician Wound #5 Right [place]: [place] Visits - Advanced - Please have HHA assist with bathing Home Health Nurse may visit PRN to address patient s wound care needs. FACE TO FACE ENCOUNTER: MEDICARE and MEDICAID PATIENTS: I certify that this patient is under my care and that I had a face-to-face encounter that meets the physician face-to-face encounter requirements with this patient on this date. The encounter with the patient was in whole or in part for the following MEDICAL CONDITION: (primary reason for [place]) MEDICAL NECESSITY: I certify, that based on my findings, NURSING services are a medically necessary home health service. HOME BOUND STATUS: I certify that my clinical findings support that this patient is homebound (i.e., Due to illness or injury, pt requires aid of supportive devices such as crutches, cane, wheelchairs, walkers, the use of special transportation or the assistance of another person to leave their place of residence. There is a normal inability to leave the home and doing so requires considerable and taxing effort. Other absences are for medical reasons / religious services and are infrequent or of short duration when for other reasons). If current dressing causes regression in wound condition, may D/C ordered dressing product/s and apply Normal Saline Moist Dressing daily until next [place] / Other MD appointment. [place] of regression in wound  condition at [phone]. Please direct any NON-WOUND related issues/requests for orders to patient's Primary Care Physician Wound #6 Left,Medial Upper Leg: [place] Visits - Advanced - Please have HHA assist with bathing Home Health Nurse may visit PRN to address patient s wound care needs. FACE TO FACE ENCOUNTER: MEDICARE and MEDICAID PATIENTS: I certify that this patient is under my care and that I had a face-to-face encounter that meets the physician face-to-face encounter requirements with  this patient on this date. The encounter with the patient was in whole or in part for the following MEDICAL CONDITION: (primary reason for [place]) MEDICAL NECESSITY: I certify, that based on my findings, NURSING services are a medically necessary home health service. HOME BOUND STATUS: I certify that my clinical findings support that this patient is homebound (i.e., Due to illness or injury, pt requires aid of supportive devices such as crutches, cane, wheelchairs, walkers, the use of special transportation or the assistance of another person to leave their place of residence. There is a normal inability to leave the home and doing so requires considerable and taxing effort. Other absences are for medical reasons / religious services and are infrequent or of short duration when for other reasons). If current dressing causes regression in wound condition, may D/C ordered dressing product/s and apply Normal Saline Moist Dressing daily until next [place] / Other MD appointment. Notify Wound JETTIE, FERRENCE ([id number]) [place] of regression in wound condition at [phone]. Please direct any NON-WOUND related issues/requests for orders to patient's Primary Care Physician Hyperbaric Oxygen Therapy: Wound #1 Left Calcaneous: Indication: - osteomyelitis If appropriate for treatment, begin HBOT per protocol: 2.0 ATA for 90 Minutes without Air Breaks One  treatment per day (delivered Monday through Friday unless otherwise specified in Special Instructions below): Total # of Treatments: - 40 Finger stick Blood Glucose Pre- and Post- HBOT Treatment. Follow Hyperbaric Oxygen Glycemia Protocol HBO Contraindications: Wound #1 Left Calcaneous: HBO contraindications of hyperbaric oxygen therapy were reviewed and the patient found to have no untreated pneumothorax or history of spontaneous pneumothorax. HBO contraindications of hyperbaric oxygen therapy were reviewed and the patient found to have no history of medications such as Bleomycin, Adriamycin, disulfiram, cisplatin and sulfamylon and is not currently receiving any chemotherapy. HBO contraindications of hyperbaric oxygen therapy were reviewed and the patient found to have no Upper respiratory infection and chronic sinusitis. HBO contraindications of hyperbaric oxygen therapy were reviewed and the patient found to have no history of retinal surgery proceeding 6 weeks or intraocular gas HBO contraindications of hyperbaric oxygen therapy were reviewed and the patient found to have no history seizure disorder or any anticonvulsant medication. HBO contraindications of hyperbaric oxygen therapy were reviewed and the patient found to have no septicemia with CO2 retention. HBO contraindications of hyperbaric oxygen therapy were reviewed and the patient found to have no fever greater than 100 degrees. HBO contraindications of hyperbaric oxygen therapy were reviewed and the patient found to have no pregnancy noted. HBO contraindications of hyperbaric oxygen therapy were reviewed and the patient found to have no medications such as steroids or narcotics or Phenergan. Medications-please add to medication list.: Wound #1 Left Calcaneous: P.O. Antibiotics - start antibiotics ordered by Dr. Ola Spurr Follow-Up Appointments: A follow-up appointment should be scheduled. A Patient Clinical Summary of  Care was provided to [facility] Silver alginate. LANDYN, HORA ([id number]) [place]. Nystatin powder to buttocks. Offloading recommendations given. Electronic Signature(s) Signed: 05/17/2015 10:10:01 AM By: Paulla Fore, RRT, CHT Signed: 05/17/2015 11:01:40 AM By: Loletha Grayer MD Previous Signature: 03/03/2015 2:40:06 PM Version By: Loletha Grayer MD Entered By: Lorine Bears on 05/17/2015 10:10:00 [name] ([id number]) -------------------------------------------------------------------------------- SuperBill Details Patient Name: [name] Date of Service: 03/03/2015 Medical Record Number: [id number] Patient Account Number: [id number] Date of Birth/Sex: 10/07/1956 (59 y.o. Female) Treating RN: Primary Care Physician: [name] Other Clinician: Referring Physician: [name] Treating Physician/Extender: BURNS, Charlean Sanfilippo in  Treatment: 7 Diagnosis Coding ICD-10 Codes Code Description E11.621 Type 2 diabetes mellitus with foot ulcer L97.422 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 layer exposed L97.522 Non-pressure chronic ulcer of other part of left foot with fat layer exposed [id number] Chronic systolic (congestive) heart failure E11.42 Type 2 diabetes mellitus with diabetic polyneuropathy S00.33XA Contusion of nose, initial encounter 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, 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, stage 3 L89.323 Pressure ulcer of left buttock, stage 3 Facility Procedures CPT4 Code: [id number] Description: [id number] - DEB SUBQ TISSUE 20 SQ CM/< ICD-10 Description Diagnosis E11.621 Type 2 diabetes mellitus with foot ulcer Modifier: Quantity: 1 Physician Procedures CPT4 Code: [id number] Description: 99213 - WC PHYS LEVEL 3 - EST PT ICD-10 Description Diagnosis 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 ([id number]) Modifier: Quantity: 1 Electronic Signature(s) Signed: 03/03/2015 2:40:06 PM By: Loletha Grayer MD Entered By: Loletha Grayer on 03/03/2015 14:39:47

## 2015-03-04 NOTE — Progress Notes (Signed)
Granulation Amount: Large (67-100%) Exposed Structure Granulation Quality: Red Fascia Exposed: No Necrotic Amount: None Present (0%) Fat Layer Exposed: No Tendon Exposed: No DEZTINI, HENES. ([id number]) Muscle Exposed: No Joint Exposed: No Bone Exposed: No Limited to Skin Breakdown Periwound Skin Texture Texture Color No Abnormalities Noted: No No Abnormalities Noted: No Callus: No Atrophie Blanche: No Crepitus: No Cyanosis: No Excoriation: No Ecchymosis: No Fluctuance: No Erythema: No Friable: No Hemosiderin Staining: No Induration: No Mottled: No Localized Edema: No Pallor: No Rash: No Rubor: No Scarring: No Temperature / Pain Moisture Temperature: No Abnormality No Abnormalities Noted: No Dry / Scaly: No Maceration: No Moist: No Wound Preparation Ulcer Cleansing: Rinsed/Irrigated with Saline Topical Anesthetic Applied: None Treatment Notes Wound #5 (Right Coccyx) 1. Cleansed with: Cleanse wound with antibacterial soap and water 4. Dressing Applied: Aquacel Ag 5. Secondary Dressing Applied ABD Pad 7. Secured with Recruitment consultant) Signed: 03/03/2015 3:29:08 PM By: Montey Hora Entered By: Montey Hora on 03/03/2015 15:29:08 [name] ([id number]) -------------------------------------------------------------------------------- Wound Assessment Details Patient Name: [name] Date of Service: 03/03/2015 1:45 PM Medical Record Number: [id number] Patient Account Number: [id number] Date of Birth/Sex: August 15, 1956 (59 y.o. Female) Treating RN: Montey Hora Primary Care Physician: [name] Other Clinician: Referring Physician: [name] Treating Physician/Extender: BURNS, Charlean Sanfilippo in Treatment: 7 Wound Status Wound Number: 6  Primary Pressure Ulcer Etiology: Wound Location: Left Upper Leg - Medial Wound Open Wounding Event: Pressure Injury Status: Date Acquired: 02/21/2015 Comorbid Anemia, Arrhythmia, Congestive Heart Weeks Of Treatment: 0 History: Failure, Hypertension, Type II Diabetes Clustered Wound: No Photos Photo Uploaded By: Montey Hora on 03/03/2015 15:53:58 Wound Measurements Length: (cm) 4.4 Width: (cm) 5 Depth: (cm) 0.1 Area: (cm) 17.279 Volume: (cm) 1.728 % Reduction in Area: 0% % Reduction in Volume: 0% Epithelialization: Medium (34-66%) Tunneling: No Undermining: No Wound Description Classification: Category/Stage III Foul Odor Af Diabetic Severity (Wagner): Grade 2 Wound Margin: Flat and Intact Exudate Amount: Medium Exudate Type: Sanguinous Exudate Color: red ter Cleansing: No Wound Bed Granulation Amount: Large (67-100%) Exposed Structure Granulation Quality: Red Fascia Exposed: No Necrotic Amount: Small (1-33%) Fat Layer Exposed: No JAKIYAH, ROSSER ([id number]) Necrotic Quality: Adherent Slough Tendon Exposed: No Muscle Exposed: No Joint Exposed: No Bone Exposed: No Limited to Skin Breakdown Periwound Skin Texture Texture Color No Abnormalities Noted: No No Abnormalities Noted: No Callus: No Atrophie Blanche: No Crepitus: No Cyanosis: No Excoriation: No Ecchymosis: No Fluctuance: No Erythema: No Friable: No Hemosiderin Staining: No Induration: No Mottled: No Localized Edema: No Pallor: No Rash: No Rubor: No Scarring: No Temperature / Pain Moisture Temperature: No Abnormality No Abnormalities Noted: No Dry / Scaly: No Maceration: No Moist: No Wound Preparation Ulcer Cleansing: Rinsed/Irrigated with Saline Topical Anesthetic Applied: None Treatment Notes Wound #6 (Left, Medial Upper Leg) 1. Cleansed with: Cleanse wound with antibacterial soap and water 4. Dressing Applied: Aquacel Ag 5. Secondary Dressing Applied ABD Pad 7. Secured  with Recruitment consultant) Signed: 03/03/2015 3:29:44 PM By: Montey Hora Entered By: Montey Hora on 03/03/2015 15:29:43 [name] ([id number]) -------------------------------------------------------------------------------- [place] Details Patient Name: [name] Date of Service: 03/03/2015 1:45 PM Medical Record Number: [id number] Patient Account Number: [id number] Date of Birth/Sex: [birth year] (59 y.o. Female) Treating RN: Primary Care Physician: [name] Other Clinician: Referring Physician: [name] Treating Physician/Extender: Frann Rider in Treatment: 7 Vital Signs Time Taken: 14:00 Temperature (F): 98.2 Height (in): 68 Pulse (bpm): 83 Weight (lbs): 257 Respiratory Rate (breaths/min): 18 Body Mass Index (BMI): 39.1 Blood  Granulation Amount: Large (67-100%) Exposed Structure Granulation Quality: Red Fascia Exposed: No Necrotic Amount: None Present (0%) Fat Layer Exposed: No Tendon Exposed: No DEZTINI, HENES. ([id number]) Muscle Exposed: No Joint Exposed: No Bone Exposed: No Limited to Skin Breakdown Periwound Skin Texture Texture Color No Abnormalities Noted: No No Abnormalities Noted: No Callus: No Atrophie Blanche: No Crepitus: No Cyanosis: No Excoriation: No Ecchymosis: No Fluctuance: No Erythema: No Friable: No Hemosiderin Staining: No Induration: No Mottled: No Localized Edema: No Pallor: No Rash: No Rubor: No Scarring: No Temperature / Pain Moisture Temperature: No Abnormality No Abnormalities Noted: No Dry / Scaly: No Maceration: No Moist: No Wound Preparation Ulcer Cleansing: Rinsed/Irrigated with Saline Topical Anesthetic Applied: None Treatment Notes Wound #5 (Right Coccyx) 1. Cleansed with: Cleanse wound with antibacterial soap and water 4. Dressing Applied: Aquacel Ag 5. Secondary Dressing Applied ABD Pad 7. Secured with Recruitment consultant) Signed: 03/03/2015 3:29:08 PM By: Montey Hora Entered By: Montey Hora on 03/03/2015 15:29:08 [name] ([id number]) -------------------------------------------------------------------------------- Wound Assessment Details Patient Name: [name] Date of Service: 03/03/2015 1:45 PM Medical Record Number: [id number] Patient Account Number: [id number] Date of Birth/Sex: August 15, 1956 (59 y.o. Female) Treating RN: Montey Hora Primary Care Physician: [name] Other Clinician: Referring Physician: [name] Treating Physician/Extender: BURNS, Charlean Sanfilippo in Treatment: 7 Wound Status Wound Number: 6  Primary Pressure Ulcer Etiology: Wound Location: Left Upper Leg - Medial Wound Open Wounding Event: Pressure Injury Status: Date Acquired: 02/21/2015 Comorbid Anemia, Arrhythmia, Congestive Heart Weeks Of Treatment: 0 History: Failure, Hypertension, Type II Diabetes Clustered Wound: No Photos Photo Uploaded By: Montey Hora on 03/03/2015 15:53:58 Wound Measurements Length: (cm) 4.4 Width: (cm) 5 Depth: (cm) 0.1 Area: (cm) 17.279 Volume: (cm) 1.728 % Reduction in Area: 0% % Reduction in Volume: 0% Epithelialization: Medium (34-66%) Tunneling: No Undermining: No Wound Description Classification: Category/Stage III Foul Odor Af Diabetic Severity (Wagner): Grade 2 Wound Margin: Flat and Intact Exudate Amount: Medium Exudate Type: Sanguinous Exudate Color: red ter Cleansing: No Wound Bed Granulation Amount: Large (67-100%) Exposed Structure Granulation Quality: Red Fascia Exposed: No Necrotic Amount: Small (1-33%) Fat Layer Exposed: No JAKIYAH, ROSSER ([id number]) Necrotic Quality: Adherent Slough Tendon Exposed: No Muscle Exposed: No Joint Exposed: No Bone Exposed: No Limited to Skin Breakdown Periwound Skin Texture Texture Color No Abnormalities Noted: No No Abnormalities Noted: No Callus: No Atrophie Blanche: No Crepitus: No Cyanosis: No Excoriation: No Ecchymosis: No Fluctuance: No Erythema: No Friable: No Hemosiderin Staining: No Induration: No Mottled: No Localized Edema: No Pallor: No Rash: No Rubor: No Scarring: No Temperature / Pain Moisture Temperature: No Abnormality No Abnormalities Noted: No Dry / Scaly: No Maceration: No Moist: No Wound Preparation Ulcer Cleansing: Rinsed/Irrigated with Saline Topical Anesthetic Applied: None Treatment Notes Wound #6 (Left, Medial Upper Leg) 1. Cleansed with: Cleanse wound with antibacterial soap and water 4. Dressing Applied: Aquacel Ag 5. Secondary Dressing Applied ABD Pad 7. Secured  with Recruitment consultant) Signed: 03/03/2015 3:29:44 PM By: Montey Hora Entered By: Montey Hora on 03/03/2015 15:29:43 [name] ([id number]) -------------------------------------------------------------------------------- [place] Details Patient Name: [name] Date of Service: 03/03/2015 1:45 PM Medical Record Number: [id number] Patient Account Number: [id number] Date of Birth/Sex: [birth year] (59 y.o. Female) Treating RN: Primary Care Physician: [name] Other Clinician: Referring Physician: [name] Treating Physician/Extender: Frann Rider in Treatment: 7 Vital Signs Time Taken: 14:00 Temperature (F): 98.2 Height (in): 68 Pulse (bpm): 83 Weight (lbs): 257 Respiratory Rate (breaths/min): 18 Body Mass Index (BMI): 39.1 Blood  1:Diabetic Wound/Ulcer of the Lower Extremity] [2:Diabetic Wound/Ulcer of the Lower Extremity] [3:Pressure Ulcer] Date Acquired: [1:01/03/2015] [2:11/09/2014] [3:11/07/2014] Weeks of Treatment: [1:7] [2:7] [3:6] Wound Status: [1:Open] [2:Healed - Epithelialized] [3:Open] Measurements L x W x D 1.7x1.3x0.4 [2:0x0x0] [3:1.7x0.7x0.1] (cm) Area (cm) : [1:1.736] [2:0] [3:0.935] Volume (cm) : [1:0.694] [2:0] [3:0.093] % Reduction in Area: [1:55.80%] [2:100.00%] [3:-70.00%] % Reduction in Volume: -76.60% [2:100.00%] [3:15.50%] Classification: [1:Grade 2] [2:Grade 1] [3:Category/Stage III] Periwound Skin Texture: No Abnormalities Noted [2:No Abnormalities Noted] [3:No Abnormalities Noted] Periwound Skin [1:No Abnormalities Noted] [2:No Abnormalities Noted] [3:No Abnormalities Noted] Moisture: Periwound Skin Color: No Abnormalities Noted [2:No Abnormalities Noted] [3:No Abnormalities Noted] Tenderness on [1:No] [2:No] [3:No] Treatment Notes Electronic Signature(s) Signed: 03/03/2015 4:13:04 PM  By: Montey Hora Entered By: Montey Hora on 03/03/2015 14:19:18 [name] ([name] ([id number]) -------------------------------------------------------------------------------- [place] Details Patient Name: [name] Date of Service: 03/03/2015 1:45 PM Medical Record Number: [id number] Patient Account Number: [id number] Date of Birth/Sex: 04-Sep-1956 (59 y.o. Female) Treating RN: Montey Hora Primary Care Physician: [name] Other Clinician: Referring Physician: [name] Treating Physician/Extender: BURNS, Charlean Sanfilippo in Treatment: 7 Active Inactive Abuse / Safety / Falls / Self Care Management Nursing Diagnoses: Impaired physical mobility Potential for falls Self care deficit: actual or potential Goals: Patient/caregiver will verbalize understanding of skin care regimen Date Initiated: 01/10/2015 Goal Status: Active Patient/caregiver will verbalize/demonstrate measures taken to improve the patient's personal safety Date Initiated: 01/10/2015 Goal Status: Active Patient/caregiver will verbalize/demonstrate measures taken to prevent injury and/or falls Date Initiated: 01/10/2015 Goal Status: Active Patient/caregiver will verbalize/demonstrate understanding of what to do in case of emergency Date Initiated: 01/10/2015 Goal Status: Active Interventions: Assess fall risk on admission and as needed Assess impairment of mobility on admission and as needed per policy Provide education on basic hygiene Provide education on fall prevention Provide education on personal and home safety Provide education on safe transfers Treatment Activities: Education provided on Basic Hygiene : 01/10/2015 Notes: Nutrition ALIAA, KENNEMER ([id number]) Nursing Diagnoses: Impaired glucose control: actual or potential Potential for alteratiion in Nutrition/Potential for imbalanced nutrition Goals: Patient/caregiver agrees to  and verbalizes understanding of need to use nutritional supplements and/or vitamins as prescribed Date Initiated: 01/10/2015 Goal Status: Active Interventions: Assess HgA1c results as ordered upon admission and as needed Treatment Activities: Patient referred to Primary Care Physician for further nutritional evaluation : 03/03/2015 Notes: Orientation to the Wound Care Program Nursing Diagnoses: Knowledge deficit related to the wound healing center program Goals: Patient/caregiver will verbalize understanding of the [place] [facility] Date Initiated: 01/10/2015 Goal Status: Active Interventions: Provide education on orientation to the wound center Notes: Osteomyelitis Nursing Diagnoses: Potential for infection: osteomyelitis Goals: Diagnostic evaluation for osteomyelitis completed as ordered Date Initiated: 01/17/2015 Goal Status: Active Patient/caregiver will verbalize understanding of disease process and disease management Date Initiated: 01/17/2015 Goal Status: Active Signs and symptoms for osteomyelitis will be recognized and promptly addressed JAHNAYA, COHILL ([id number]) Date Initiated: 01/17/2015 Goal Status: Active Interventions: Assess for signs and symptoms of osteomyelitis resolution every visit Provide education on osteomyelitis Treatment Activities: MRI : 03/03/2015 Test ordered outside of clinic : 03/03/2015 X-ray : 03/03/2015 Notes: Soft Tissue Infection Nursing Diagnoses: Knowledge deficit related to disease process and management Potential for infection: soft tissue Goals: Patient/caregiver will verbalize understanding of or measures to prevent infection and contamination in the home setting Date Initiated: 01/17/2015 Goal Status: Active Patient's soft tissue infection will resolve Date Initiated: 01/17/2015 Goal Status: Active Signs and symptoms  FINNLEY, CANION ([id number]) Visit Report for 03/03/2015 Arrival Information Details Patient Name: HADIA, SEABERG Date of Service: 03/03/2015 1:45 PM Medical Record Number: [id number] Patient Account Number: [id number] Date of Birth/Sex: Jun 05, 1956 (59 y.o. Female) Treating RN: Primary Care Physician: [name] Other Clinician: Jacqulyn Bath Referring Physician: [name] Treating Physician/Extender: Frann Rider in Treatment: 7 Visit Information History Since Last Visit Added or deleted any medications: No Patient Arrived: Wheel Chair Any new allergies or adverse reactions: No Arrival Time: 14:04 Had a fall or experienced change in No Accompanied By: husband activities of daily living that may affect Transfer Assistance: None risk of falls: Patient Identification Verified: Yes Signs or symptoms of abuse/neglect No Secondary Verification Process Yes since last visito Completed: Hospitalized since last visit: Yes Patient Requires Transmission- No Has Dressing in Place as Prescribed: Yes Based Precautions: Has Footwear/Offloading in Place as Yes Patient Has Alerts: Yes Prescribed: Patient Alerts: Patient on Blood Left: Surgical Shoe with Thinner Pressure Relief Insole ABI L:0.8, Pain Present Now: No R:0.93 Electronic Signature(s) Signed: 03/03/2015 3:38:44 PM By: Lorine Bears RCP, RRT, CHT Entered By: Lorine Bears on 03/03/2015 14:05:43 [name] ([id number]) -------------------------------------------------------------------------------- Encounter Discharge Information Details Patient Name: [name] Date of Service: 03/03/2015 1:45 PM Medical Record Number: [id number] Patient Account Number: [id number] Date of Birth/Sex: 02-19-1956 (59 y.o. Female) Treating RN: Primary Care Physician: [name] Other Clinician: Referring Physician: [name] Treating Physician/Extender: BURNS,  Charlean Sanfilippo in Treatment: 7 Encounter Discharge Information Items Discharge Pain Level: 0 Discharge Condition: Stable Ambulatory Status: Wheelchair Discharge Destination: Home Transportation: Private Auto Accompanied By: self Schedule Follow-up Appointment: Yes Medication Reconciliation completed and provided to Patient/Care No Shaunna Rosetti: Provided on Clinical Summary of Care: 03/03/2015 Form Type Recipient Paper Patient BG Electronic Signature(s) Signed: 03/03/2015 4:13:04 PM By: Montey Hora Previous Signature: 03/03/2015 2:55:07 PM Version By: Ruthine Dose Entered By: Montey Hora on 03/03/2015 15:03:31 [name] ([id number]) -------------------------------------------------------------------------------- Lower Extremity Assessment Details Patient Name: [name] Date of Service: 03/03/2015 1:45 PM Medical Record Number: [id number] Patient Account Number: [id number] Date of Birth/Sex: 1956-08-19 (59 y.o. Female) Treating RN: Primary Care Physician: [name] Other Clinician: Referring Physician: [name] Treating Physician/Extender: Frann Rider in Treatment: 7 Edema Assessment Assessed: [Left: No] [Right: No] E[Left: dema] [Right: :] Calf Left: Right: Point of Measurement: 30 cm From Medial Instep 53.5 cm cm Ankle Left: Right: Point of Measurement: 9 cm From Medial Instep 33.5 cm cm Vascular Assessment Pulses: Posterior Tibial Palpable: [Left:No] Dorsalis Pedis Palpable: [Left:No] Extremity colors, hair growth, and conditions: Extremity Color: [Left:Pale] Hair Growth on Extremity: [Left:Yes] Temperature of Extremity: [Left:Cool] Capillary Refill: [Left:< 3 seconds] Toe Nail Assessment Left: Right: Thick: No Discolored: No Deformed: No Improper Length and Hygiene: No Electronic Signature(s) Signed: 03/03/2015 4:13:04 PM By: Montey Hora Entered By: Montey Hora on 03/03/2015 14:14:01 [name]  ([name] ([id number]) -------------------------------------------------------------------------------- Multi Wound Chart Details Patient Name: [name] Date of Service: 03/03/2015 1:45 PM Medical Record Number: [id number] Patient Account Number: [id number] Date of Birth/Sex: 1955-11-08 (59 y.o. Female) Treating RN: Montey Hora Primary Care Physician: [name] Other Clinician: Referring Physician: [name] Treating Physician/Extender: BURNS, Charlean Sanfilippo in Treatment: 7 Vital Signs Height(in): 68 Pulse(bpm): 83 Weight(lbs): 257 Blood Pressure 138/54 (mmHg): Body Mass Index(BMI): 39 Temperature(F): 98.2 Respiratory Rate 18 (breaths/min): Photos: [1:No Photos] [2:No Photos] [3:No Photos] Wound Location: [1:Left Calcaneous] [2:Dorsal Toe Second] [3:Nose] Wounding Event: [1:Gradually Appeared] [2:Gradually Appeared] [3:Pressure Injury] Primary Etiology: [  Granulation Amount: Large (67-100%) Exposed Structure Granulation Quality: Red Fascia Exposed: No Necrotic Amount: None Present (0%) Fat Layer Exposed: No Tendon Exposed: No DEZTINI, HENES. ([id number]) Muscle Exposed: No Joint Exposed: No Bone Exposed: No Limited to Skin Breakdown Periwound Skin Texture Texture Color No Abnormalities Noted: No No Abnormalities Noted: No Callus: No Atrophie Blanche: No Crepitus: No Cyanosis: No Excoriation: No Ecchymosis: No Fluctuance: No Erythema: No Friable: No Hemosiderin Staining: No Induration: No Mottled: No Localized Edema: No Pallor: No Rash: No Rubor: No Scarring: No Temperature / Pain Moisture Temperature: No Abnormality No Abnormalities Noted: No Dry / Scaly: No Maceration: No Moist: No Wound Preparation Ulcer Cleansing: Rinsed/Irrigated with Saline Topical Anesthetic Applied: None Treatment Notes Wound #5 (Right Coccyx) 1. Cleansed with: Cleanse wound with antibacterial soap and water 4. Dressing Applied: Aquacel Ag 5. Secondary Dressing Applied ABD Pad 7. Secured with Recruitment consultant) Signed: 03/03/2015 3:29:08 PM By: Montey Hora Entered By: Montey Hora on 03/03/2015 15:29:08 [name] ([id number]) -------------------------------------------------------------------------------- Wound Assessment Details Patient Name: [name] Date of Service: 03/03/2015 1:45 PM Medical Record Number: [id number] Patient Account Number: [id number] Date of Birth/Sex: August 15, 1956 (59 y.o. Female) Treating RN: Montey Hora Primary Care Physician: [name] Other Clinician: Referring Physician: [name] Treating Physician/Extender: BURNS, Charlean Sanfilippo in Treatment: 7 Wound Status Wound Number: 6  Primary Pressure Ulcer Etiology: Wound Location: Left Upper Leg - Medial Wound Open Wounding Event: Pressure Injury Status: Date Acquired: 02/21/2015 Comorbid Anemia, Arrhythmia, Congestive Heart Weeks Of Treatment: 0 History: Failure, Hypertension, Type II Diabetes Clustered Wound: No Photos Photo Uploaded By: Montey Hora on 03/03/2015 15:53:58 Wound Measurements Length: (cm) 4.4 Width: (cm) 5 Depth: (cm) 0.1 Area: (cm) 17.279 Volume: (cm) 1.728 % Reduction in Area: 0% % Reduction in Volume: 0% Epithelialization: Medium (34-66%) Tunneling: No Undermining: No Wound Description Classification: Category/Stage III Foul Odor Af Diabetic Severity (Wagner): Grade 2 Wound Margin: Flat and Intact Exudate Amount: Medium Exudate Type: Sanguinous Exudate Color: red ter Cleansing: No Wound Bed Granulation Amount: Large (67-100%) Exposed Structure Granulation Quality: Red Fascia Exposed: No Necrotic Amount: Small (1-33%) Fat Layer Exposed: No JAKIYAH, ROSSER ([id number]) Necrotic Quality: Adherent Slough Tendon Exposed: No Muscle Exposed: No Joint Exposed: No Bone Exposed: No Limited to Skin Breakdown Periwound Skin Texture Texture Color No Abnormalities Noted: No No Abnormalities Noted: No Callus: No Atrophie Blanche: No Crepitus: No Cyanosis: No Excoriation: No Ecchymosis: No Fluctuance: No Erythema: No Friable: No Hemosiderin Staining: No Induration: No Mottled: No Localized Edema: No Pallor: No Rash: No Rubor: No Scarring: No Temperature / Pain Moisture Temperature: No Abnormality No Abnormalities Noted: No Dry / Scaly: No Maceration: No Moist: No Wound Preparation Ulcer Cleansing: Rinsed/Irrigated with Saline Topical Anesthetic Applied: None Treatment Notes Wound #6 (Left, Medial Upper Leg) 1. Cleansed with: Cleanse wound with antibacterial soap and water 4. Dressing Applied: Aquacel Ag 5. Secondary Dressing Applied ABD Pad 7. Secured  with Recruitment consultant) Signed: 03/03/2015 3:29:44 PM By: Montey Hora Entered By: Montey Hora on 03/03/2015 15:29:43 [name] ([id number]) -------------------------------------------------------------------------------- [place] Details Patient Name: [name] Date of Service: 03/03/2015 1:45 PM Medical Record Number: [id number] Patient Account Number: [id number] Date of Birth/Sex: [birth year] (59 y.o. Female) Treating RN: Primary Care Physician: [name] Other Clinician: Referring Physician: [name] Treating Physician/Extender: Frann Rider in Treatment: 7 Vital Signs Time Taken: 14:00 Temperature (F): 98.2 Height (in): 68 Pulse (bpm): 83 Weight (lbs): 257 Respiratory Rate (breaths/min): 18 Body Mass Index (BMI): 39.1 Blood  Granulation Amount: Large (67-100%) Exposed Structure Granulation Quality: Red Fascia Exposed: No Necrotic Amount: None Present (0%) Fat Layer Exposed: No Tendon Exposed: No DEZTINI, HENES. ([id number]) Muscle Exposed: No Joint Exposed: No Bone Exposed: No Limited to Skin Breakdown Periwound Skin Texture Texture Color No Abnormalities Noted: No No Abnormalities Noted: No Callus: No Atrophie Blanche: No Crepitus: No Cyanosis: No Excoriation: No Ecchymosis: No Fluctuance: No Erythema: No Friable: No Hemosiderin Staining: No Induration: No Mottled: No Localized Edema: No Pallor: No Rash: No Rubor: No Scarring: No Temperature / Pain Moisture Temperature: No Abnormality No Abnormalities Noted: No Dry / Scaly: No Maceration: No Moist: No Wound Preparation Ulcer Cleansing: Rinsed/Irrigated with Saline Topical Anesthetic Applied: None Treatment Notes Wound #5 (Right Coccyx) 1. Cleansed with: Cleanse wound with antibacterial soap and water 4. Dressing Applied: Aquacel Ag 5. Secondary Dressing Applied ABD Pad 7. Secured with Recruitment consultant) Signed: 03/03/2015 3:29:08 PM By: Montey Hora Entered By: Montey Hora on 03/03/2015 15:29:08 [name] ([id number]) -------------------------------------------------------------------------------- Wound Assessment Details Patient Name: [name] Date of Service: 03/03/2015 1:45 PM Medical Record Number: [id number] Patient Account Number: [id number] Date of Birth/Sex: August 15, 1956 (59 y.o. Female) Treating RN: Montey Hora Primary Care Physician: [name] Other Clinician: Referring Physician: [name] Treating Physician/Extender: BURNS, Charlean Sanfilippo in Treatment: 7 Wound Status Wound Number: 6  Primary Pressure Ulcer Etiology: Wound Location: Left Upper Leg - Medial Wound Open Wounding Event: Pressure Injury Status: Date Acquired: 02/21/2015 Comorbid Anemia, Arrhythmia, Congestive Heart Weeks Of Treatment: 0 History: Failure, Hypertension, Type II Diabetes Clustered Wound: No Photos Photo Uploaded By: Montey Hora on 03/03/2015 15:53:58 Wound Measurements Length: (cm) 4.4 Width: (cm) 5 Depth: (cm) 0.1 Area: (cm) 17.279 Volume: (cm) 1.728 % Reduction in Area: 0% % Reduction in Volume: 0% Epithelialization: Medium (34-66%) Tunneling: No Undermining: No Wound Description Classification: Category/Stage III Foul Odor Af Diabetic Severity (Wagner): Grade 2 Wound Margin: Flat and Intact Exudate Amount: Medium Exudate Type: Sanguinous Exudate Color: red ter Cleansing: No Wound Bed Granulation Amount: Large (67-100%) Exposed Structure Granulation Quality: Red Fascia Exposed: No Necrotic Amount: Small (1-33%) Fat Layer Exposed: No JAKIYAH, ROSSER ([id number]) Necrotic Quality: Adherent Slough Tendon Exposed: No Muscle Exposed: No Joint Exposed: No Bone Exposed: No Limited to Skin Breakdown Periwound Skin Texture Texture Color No Abnormalities Noted: No No Abnormalities Noted: No Callus: No Atrophie Blanche: No Crepitus: No Cyanosis: No Excoriation: No Ecchymosis: No Fluctuance: No Erythema: No Friable: No Hemosiderin Staining: No Induration: No Mottled: No Localized Edema: No Pallor: No Rash: No Rubor: No Scarring: No Temperature / Pain Moisture Temperature: No Abnormality No Abnormalities Noted: No Dry / Scaly: No Maceration: No Moist: No Wound Preparation Ulcer Cleansing: Rinsed/Irrigated with Saline Topical Anesthetic Applied: None Treatment Notes Wound #6 (Left, Medial Upper Leg) 1. Cleansed with: Cleanse wound with antibacterial soap and water 4. Dressing Applied: Aquacel Ag 5. Secondary Dressing Applied ABD Pad 7. Secured  with Recruitment consultant) Signed: 03/03/2015 3:29:44 PM By: Montey Hora Entered By: Montey Hora on 03/03/2015 15:29:43 [name] ([id number]) -------------------------------------------------------------------------------- Mallory Details Patient Name: [name] Date of Service: 03/03/2015 1:45 PM Medical Record Number: [id number] Patient Account Number: [id number] Date of Birth/Sex: [birth year] (59 y.o. Female) Treating RN: Primary Care Physician: [name] Other Clinician: Referring Physician: [name] Treating Physician/Extender: Frann Rider in Treatment: 7 Vital Signs Time Taken: 14:00 Temperature (F): 98.2 Height (in): 68 Pulse (bpm): 83 Weight (lbs): 257 Respiratory Rate (breaths/min): 18 Body Mass Index (BMI): 39.1 Blood  of infection will be recognized early to allow for prompt treatment Date Initiated: 01/17/2015 Goal  Status: Active Interventions: Assess signs and symptoms of infection every visit Provide education on infection Screen for HBO Treatment Activities: Culture : 03/03/2015 Culture and sensitivity : 03/03/2015 Systemic antibiotics : 03/03/2015 Test ordered outside of clinic : 03/03/2015 Notes: Wound/Skin Impairment KARRINE, FIGARO ([id number]) Nursing Diagnoses: Impaired tissue integrity Knowledge deficit related to ulceration/compromised skin integrity Goals: Patient/caregiver will verbalize understanding of skin care regimen Date Initiated: 01/10/2015 Goal Status: Active Ulcer/skin breakdown will have a volume reduction of 30% by week 4 Date Initiated: 01/10/2015 Goal Status: Active Ulcer/skin breakdown will have a volume reduction of 50% by week 8 Date Initiated: 01/10/2015 Goal Status: Active Ulcer/skin breakdown will have a volume reduction of 80% by week 12 Date Initiated: 01/10/2015 Goal Status: Active Ulcer/skin breakdown will heal within 14 weeks Date Initiated: 01/10/2015 Goal Status: Active Interventions: Assess patient/caregiver ability to obtain necessary supplies Assess patient/caregiver ability to perform ulcer/skin care regimen upon admission and as needed Assess ulceration(s) every visit Provide education on smoking Provide education on ulcer and skin care Treatment Activities: Consult for HBO : 03/03/2015 Skin care regimen initiated : 03/03/2015 Topical wound management initiated : 03/03/2015 Notes: Electronic Signature(s) Signed: 03/03/2015 4:13:04 PM By: Montey Hora Entered By: Montey Hora on 03/03/2015 14:18:54 [name] ([id number]) -------------------------------------------------------------------------------- Pain Assessment Details Patient Name: [name] Date of Service: 03/03/2015 1:45 PM Medical Record Number: [id number] Patient Account Number: [id number] Date of Birth/Sex: 1956/01/30 (59 y.o. Female) Treating RN: Primary Care  Physician: [name] Other Clinician: Referring Physician: [name] Treating Physician/Extender: Frann Rider in Treatment: 7 Active Problems Location of Pain Severity and Description of Pain Patient Has Paino No Site Locations Pain Management and Medication Current Pain Management: Electronic Signature(s) Signed: 03/03/2015 3:38:44 PM By: Lorine Bears RCP, RRT, CHT Entered By: Lorine Bears on 03/03/2015 14:05:58 [name] ([id number]) -------------------------------------------------------------------------------- Patient/Caregiver Education Details Patient Name: [name] Date of Service: 03/03/2015 1:45 PM Medical Record Number: [id number] Patient Account Number: [id number] Date of Birth/Gender: [birth year] (59 y.o. Female) Treating RN: Montey Hora Primary Care Physician: [name] Other Clinician: Referring Physician: [name] Treating Physician/Extender: BURNS, Charlean Sanfilippo in Treatment: 7 Education Assessment Education Provided To: Patient Education Topics Provided Wound/Skin Impairment: Handouts: Caring for Your Ulcer Methods: Explain/Verbal Responses: State content correctly Electronic Signature(s) Signed: 03/03/2015 4:13:04 PM By: Montey Hora Entered By: Montey Hora on 03/03/2015 15:03:41 [name] ([id number]) -------------------------------------------------------------------------------- Wound Assessment Details Patient Name: [name] Date of Service: 03/03/2015 1:45 PM Medical Record Number: [id number] Patient Account Number: [id number] Date of Birth/Sex: 1955/11/19 (59 y.o. 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  Location: Left Calcaneous Wound Open Wounding Event: Gradually Appeared Status: Date Acquired: 01/03/2015 Comorbid Anemia, Arrhythmia, Congestive Heart Weeks Of Treatment: 7 History: Failure, Hypertension, Type II Diabetes Clustered Wound: No Photos Photo Uploaded By: Montey Hora on 03/03/2015 15:52:45 Wound Measurements Length: (cm) 1.7 Width: (cm) 1.3 Depth: (cm) 0.4 Area: (cm) 1.736 Volume: (cm) 0.694 % Reduction in Area: 55.8% % Reduction in Volume: -76.6% Epithelialization: Small (1-33%) Tunneling: No Undermining: No Wound Description Classification: Grade 2 Wound Margin: Distinct, outline attached Exudate Amount: Large Exudate Type: Serosanguineous Exudate Color: red, brown Foul Odor After Cleansing: No Wound Bed Granulation Amount: Medium (34-66%)

## 2015-03-09 ENCOUNTER — Ambulatory Visit: Payer: PPO | Admitting: Surgery

## 2015-03-10 ENCOUNTER — Encounter: Payer: PPO | Attending: Surgery | Admitting: Surgery

## 2015-03-10 ENCOUNTER — Other Ambulatory Visit: Payer: Self-pay | Admitting: Surgery

## 2015-03-10 DIAGNOSIS — M86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, left ankle and foot: Secondary | ICD-10-CM | POA: Diagnosis not present

## 2015-03-10 DIAGNOSIS — L97422 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 layer exposed: Secondary | ICD-10-CM | POA: Diagnosis not present

## 2015-03-10 DIAGNOSIS — L89323 Pressure ulcer of left buttock, stage 3: Secondary | ICD-10-CM | POA: Insufficient documentation

## 2015-03-10 DIAGNOSIS — E11621 Type 2 diabetes mellitus with foot ulcer: Secondary | ICD-10-CM | POA: Insufficient documentation

## 2015-03-10 DIAGNOSIS — L89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, stage 3: Secondary | ICD-10-CM | POA: Diagnosis not present

## 2015-03-10 DIAGNOSIS — E1142 Type 2 diabetes mellitus with diabetic polyneuropathy: Secondary | ICD-10-CM | POA: Diagnosis not present

## 2015-03-10 DIAGNOSIS — S0033XA Contusion of nose, initial encounter: Secondary | ICD-10-CM | POA: Diagnosis not present

## 2015-03-10 DIAGNOSIS — I5022 Chronic systolic (congestive) heart failure: Secondary | ICD-10-CM | POA: Diagnosis not present

## 2015-03-10 DIAGNOSIS — X58XXXA Exposure to other specified factors, initial encounter: Secondary | ICD-10-CM | POA: Diagnosis not present

## 2015-03-10 DIAGNOSIS — L97522 Non-pressure chronic ulcer of other part of left foot with fat layer exposed: Secondary | ICD-10-CM | POA: Insufficient documentation

## 2015-03-10 DIAGNOSIS — D649 Anemia, unspecified: Secondary | ICD-10-CM | POA: Diagnosis not present

## 2015-03-10 DIAGNOSIS — L97512 Non-pressure chronic ulcer of other part of right foot with fat layer exposed: Secondary | ICD-10-CM | POA: Diagnosis not present

## 2015-03-10 DIAGNOSIS — R0602 Shortness of breath: Secondary | ICD-10-CM

## 2015-03-11 NOTE — Progress Notes (Signed)
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 Underlying infection not excluded. I have personally discussed the x-ray studies with the radiologist Dr. Kerby Moors and after comparing all [name], [name]. ([id number]) her films he agrees that the left second phalanx does not have any definite underlying infection and this is now off a chronic arthropathy. There is a increased uptake in all 3 phases localized to the left hindfoot region and this is a manifestation of osteomyelitis. 02/07/2015 -- She was seen by the ID specialist Dr. Ola Spurr who reviewed her on my request recently. He has recommended a PICC line and IV vancomycin and oral ciprofloxacin for 4 weeks. She will be reviewed by him periodically. He also received several notes from Dr. Sonia Baller to help as documented all the requisites for hyperbaric oxygen therapy. 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 He also had a coronary angioplasty with stent placement in October 2013. Prior to that her 2-D echo showed EF of 35%. Her latest 2-D echo done on 11/01/2014 showed a LV EF of 50-55%. Systolic function and wall motion were within normal limits. Chest x-ray done in January 16, 2015 showed no acute findings. Last  hemoglobin A1c done on 12/30/2014 was 6.7. CBC done on the same day was within normal limits, except for a hemoglobin being 8.7 and a hematocrit between 29%. She had arterial duplex study done in August 2015 which showed: No evidence of segmental lower extremity arterial disease at rest, bilaterally. Normal ABI's, bilaterally. Normal great toe pressures, bilaterally. With all the above in place I believe she is a very good candidate for hyperbaric oxygen therapy and I would recommend this with the usual protocol for 6 weeks. she is going to get her PICC line this Friday and the IV antibiotics and when to start. She is also starting on oral ciprofloxacin. 03/03/2015 - recently hospitalized for anemia. No source identified. Started on amiodarone, contraindication to HBO. Reports left heel feels better. Main complaint is new buttocks ulcers. No fever or chills. Minimal drainage. 03/10/2015 -- There is a new ulceration noticed yesterday on her right second toe at the tip. She also has noticed some ulcerations on her gluteal region and we will need to take a look at this. As far as her medications go she does not know for sure what her dosage of amiodarone is exactly. She will look it up and let me know today. Electronic Signature(s) Signed: 03/10/2015 4:59:10 PM By: Christin Fudge MD, FACS Entered By: Christin Fudge on 03/10/2015 15:49:44 [name] ([id number]) -------------------------------------------------------------------------------- Physical Exam Details Patient Name: [name] Date of Service: 03/10/2015 2:30 PM Medical Record Number: [id number] Patient Account Number: [id number] Date of Birth/Sex: [birth year] (59 y.o. Female) Treating RN: Primary Care Physician: [name] Other Clinician: Referring Physician: [name] Treating Physician/Extender: Frann Rider in Treatment: 8 Constitutional . Pulse regular. Respirations normal and unlabored. Afebrile.  . Eyes Nonicteric. Reactive to light. Ears, Nose, Mouth, and Throat the eschar on her nose is now separating and will need sharp debridement.. Neck supple and nontender. No palpable supraclavicular or cervical adenopathy. Normal sized without goiter. Respiratory WNL. No retractions.. Cardiovascular no palpable pedal pulses. bilateral +1 pitting edema. Integumentary (Hair, Skin) The area on her left lateral calcaneal has minimal slough but the area on her right second toe has a lot of eschar and slough and will eat chopped debridement.. No crepitus or fluctuance. No peri-wound warmth or erythema. No  currently receiving any chemotherapy. HBO contraindications of hyperbaric oxygen therapy were reviewed and the patient found to have no Upper respiratory infection and chronic sinusitis. HBO contraindications of hyperbaric oxygen therapy were reviewed and the patient found to have no history of retinal surgery proceeding 6 weeks or intraocular gas HBO contraindications of hyperbaric oxygen therapy were reviewed and the patient found to have no history seizure disorder or any anticonvulsant medication. HBO contraindications of hyperbaric oxygen therapy were reviewed and the patient found to have no septicemia with CO2 retention. HBO contraindications of hyperbaric oxygen therapy were reviewed and the patient found to have no fever greater than 100 degrees. HBO contraindications of hyperbaric oxygen therapy were reviewed and the patient found to have no pregnancy noted. HBO contraindications of hyperbaric oxygen therapy were reviewed and the patient found to have no medications such as steroids or narcotics or Phenergan. Medications-please add to medication list.: Wound #1 Left Calcaneous: P.O. Antibiotics - start antibiotics ordered by Dr. Ola Spurr Cipro and vancomycin Radiology ordered were: X-ray, Chest ordered were: Pulmonary Function Test [name], [name]  ([id number]) I recommended Prisma AG to all the areas on her gluteal region and the bridge of her nose. She will need silver alginate over her right second toe and the left calcaneal region. I have also asked her to offload from her gluteal area as often as possible and make sure she changes position frequently. I have also asked her to look up her dosage of amiodarone and let us know today. In the meanwhile we'll get a chest x-ray and a PFT done and if necessary refer her to a pulmonologist to clear her to get back into the hyperbaric oxygen therapy chamber. She says she will be compliant and see me back next week. Electronic Signature(s) Signed: 03/10/2015 4:59:10 PM By: Christin Fudge MD, FACS Entered By: Christin Fudge on 03/10/2015 16:23:09 [name] ([id number]) -------------------------------------------------------------------------------- SuperBill Details Patient Name: [name] Date of Service: 03/10/2015 Medical Record Number: [id number] Patient Account Number: [id number] Date of Birth/Sex: 01/02/1956 (59 y.o. 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, 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, stage 3 L89.323 Pressure ulcer of left buttock, stage 3 L97.512 Non-pressure chronic ulcer of other part of right foot with fat layer exposed Facility  Procedures CPT4 Code Description: [id number] - WOUND CARE VISIT-LEV 2 EST PT Modifier: Quantity: 1 CPT4 Code Description: [id number] - DEB SUBQ TISSUE 20 SQ CM/< ICD-10 Description Diagnosis S00.33XA Contusion of nose, initial encounter 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  currently receiving any chemotherapy. HBO contraindications of hyperbaric oxygen therapy were reviewed and the patient found to have no Upper respiratory infection and chronic sinusitis. HBO contraindications of hyperbaric oxygen therapy were reviewed and the patient found to have no history of retinal surgery proceeding 6 weeks or intraocular gas HBO contraindications of hyperbaric oxygen therapy were reviewed and the patient found to have no history seizure disorder or any anticonvulsant medication. HBO contraindications of hyperbaric oxygen therapy were reviewed and the patient found to have no septicemia with CO2 retention. HBO contraindications of hyperbaric oxygen therapy were reviewed and the patient found to have no fever greater than 100 degrees. HBO contraindications of hyperbaric oxygen therapy were reviewed and the patient found to have no pregnancy noted. HBO contraindications of hyperbaric oxygen therapy were reviewed and the patient found to have no medications such as steroids or narcotics or Phenergan. Medications-please add to medication list.: Wound #1 Left Calcaneous: P.O. Antibiotics - start antibiotics ordered by Dr. Ola Spurr Cipro and vancomycin Radiology ordered were: X-ray, Chest ordered were: Pulmonary Function Test [name], [name]  ([id number]) I recommended Prisma AG to all the areas on her gluteal region and the bridge of her nose. She will need silver alginate over her right second toe and the left calcaneal region. I have also asked her to offload from her gluteal area as often as possible and make sure she changes position frequently. I have also asked her to look up her dosage of amiodarone and let us know today. In the meanwhile we'll get a chest x-ray and a PFT done and if necessary refer her to a pulmonologist to clear her to get back into the hyperbaric oxygen therapy chamber. She says she will be compliant and see me back next week. Electronic Signature(s) Signed: 03/10/2015 4:59:10 PM By: Christin Fudge MD, FACS Entered By: Christin Fudge on 03/10/2015 16:23:09 [name] ([id number]) -------------------------------------------------------------------------------- SuperBill Details Patient Name: [name] Date of Service: 03/10/2015 Medical Record Number: [id number] Patient Account Number: [id number] Date of Birth/Sex: 01/02/1956 (59 y.o. 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, 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, stage 3 L89.323 Pressure ulcer of left buttock, stage 3 L97.512 Non-pressure chronic ulcer of other part of right foot with fat layer exposed Facility  Procedures CPT4 Code Description: [id number] - WOUND CARE VISIT-LEV 2 EST PT Modifier: Quantity: 1 CPT4 Code Description: [id number] - DEB SUBQ TISSUE 20 SQ CM/< ICD-10 Description Diagnosis S00.33XA Contusion of nose, initial encounter 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 Underlying infection not excluded. I have personally discussed the x-ray studies with the radiologist Dr. Kerby Moors and after comparing all [name], [name]. ([id number]) her films he agrees that the left second phalanx does not have any definite underlying infection and this is now off a chronic arthropathy. There is a increased uptake in all 3 phases localized to the left hindfoot region and this is a manifestation of osteomyelitis. 02/07/2015 -- She was seen by the ID specialist Dr. Ola Spurr who reviewed her on my request recently. He has recommended a PICC line and IV vancomycin and oral ciprofloxacin for 4 weeks. She will be reviewed by him periodically. He also received several notes from Dr. Sonia Baller to help as documented all the requisites for hyperbaric oxygen therapy. 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 He also had a coronary angioplasty with stent placement in October 2013. Prior to that her 2-D echo showed EF of 35%. Her latest 2-D echo done on 11/01/2014 showed a LV EF of 50-55%. Systolic function and wall motion were within normal limits. Chest x-ray done in January 16, 2015 showed no acute findings. Last  hemoglobin A1c done on 12/30/2014 was 6.7. CBC done on the same day was within normal limits, except for a hemoglobin being 8.7 and a hematocrit between 29%. She had arterial duplex study done in August 2015 which showed: No evidence of segmental lower extremity arterial disease at rest, bilaterally. Normal ABI's, bilaterally. Normal great toe pressures, bilaterally. With all the above in place I believe she is a very good candidate for hyperbaric oxygen therapy and I would recommend this with the usual protocol for 6 weeks. she is going to get her PICC line this Friday and the IV antibiotics and when to start. She is also starting on oral ciprofloxacin. 03/03/2015 - recently hospitalized for anemia. No source identified. Started on amiodarone, contraindication to HBO. Reports left heel feels better. Main complaint is new buttocks ulcers. No fever or chills. Minimal drainage. 03/10/2015 -- There is a new ulceration noticed yesterday on her right second toe at the tip. She also has noticed some ulcerations on her gluteal region and we will need to take a look at this. As far as her medications go she does not know for sure what her dosage of amiodarone is exactly. She will look it up and let me know today. Electronic Signature(s) Signed: 03/10/2015 4:59:10 PM By: Christin Fudge MD, FACS Entered By: Christin Fudge on 03/10/2015 15:49:44 [name] ([id number]) -------------------------------------------------------------------------------- Physical Exam Details Patient Name: [name] Date of Service: 03/10/2015 2:30 PM Medical Record Number: [id number] Patient Account Number: [id number] Date of Birth/Sex: [birth year] (59 y.o. Female) Treating RN: Primary Care Physician: [name] Other Clinician: Referring Physician: [name] Treating Physician/Extender: Frann Rider in Treatment: 8 Constitutional . Pulse regular. Respirations normal and unlabored. Afebrile.  . Eyes Nonicteric. Reactive to light. Ears, Nose, Mouth, and Throat the eschar on her nose is now separating and will need sharp debridement.. Neck supple and nontender. No palpable supraclavicular or cervical adenopathy. Normal sized without goiter. Respiratory WNL. No retractions.. Cardiovascular no palpable pedal pulses. bilateral +1 pitting edema. Integumentary (Hair, Skin) The area on her left lateral calcaneal has minimal slough but the area on her right second toe has a lot of eschar and slough and will eat chopped debridement.. No crepitus or fluctuance. No peri-wound warmth or erythema. No  Jackson Latino., MD Debridement: Debridement Pre-procedure Yes Verification/Time Out Taken: Start Time: 15:25 Pain Control: Other : lidocaine 4% Level: Skin/Subcutaneous Tissue Total Area Debrided (L x 0.8 (cm) x 1.3 (cm) = 1.04 (cm) W): Tissue and other Eschar, Fibrin/Slough, Subcutaneous material debrided: Bleeding: Moderate Hemostasis Achieved: Silver Nitrate End Time: 15:39 Procedural Pain: 0 Post Procedural Pain: 0 Response to Treatment: Procedure was tolerated well Post Debridement Measurements of Total Wound Length: (cm) 0.8 Width: (cm) 1.3 Depth: (cm) 0.1 Volume: (cm) 0.082 Electronic Signature(s) Signed: 03/10/2015 4:59:10 PM By: Christin Fudge MD, FACS Entered By: Christin Fudge on 03/10/2015 15:47:17 [name] ([id number]) -------------------------------------------------------------------------------- HPI Details Patient Name: [name] Date of Service: 03/10/2015 2:30 PM Medical Record Number: [id number] Patient Account Number: [id number] Date of Birth/Sex: 1956-04-08 (59 y.o. Female) Treating RN: Primary Care Physician: [name] Other Clinician: Referring Physician: [name] Treating Physician/Extender: Frann Rider in Treatment: 8 History of Present Illness HPI Description: 59 year old patient who is known to have diabetes for several years and  generally has it under good control last hemoglobin A1c was 6.7 done 2 weeks ago. She has several other comorbidities including acute CHF, hypoxia, hyperlipidemia, systolic heart failure, myocardial infarction, coronary artery disease, iron deficiency anemia, renal insufficiency, essential hypertension, morbid obesity and obstructive sleep apnea. 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 In the past she's had a coronary angioplasty with stent in 2013, laparoscopic cholecystectomy in 2011, bilateral oophorectomy in April 2012 and she's had some lumbar disc surgery in 1996. She says she's had a lot of peripheral neuropathy and due to this she's had a ulcer on her left second toe for a while. She had a dry eschar on her left heel and recently this was removed and she found that she had some infection and some drainage from there. She has minimal pain around the heel but no pain on any areas of her toes. she has not been on any antibiotics recently and has known vascular problems in the recent past. 01/17/2015 left foot x-ray done on April 5 reveals there are destructive changes noted of the distal aspect of the proximal phalanx of the left second digit with extension into the adjacent proximal interphalangeal joint space. These findings suggest osteomyelitis with possible septic arthritis. I was able to make a call to Dr. Nevada Crane the radiologist, and had a detailed discussion with him regarding this x-ray. He noted that there were also some changes in the fifth phalanx on the left foot and this could have been chronic osteomyelitis too. The second toe on the left leg shows findings which cannot differentiate between an acute or chronic osteomyelitis and hence he has recommended a MRI with and without contrast. We will get this organized for her and in the meanwhile treat her with oral antibiotics which is susceptible to Bactrim. she was seen  yesterday by her PCP Dr. Chaya Jan who put her on 3 antibiotics which include Levaquin and doxycycline and Bactrim. 01/24/2015 She is scheduled for a triple PHASE bone scan on 01/26/2015. She is still on antibiotics and otherwise doing well. 01/31/2015 Bone Imaging 3 phase study -- IMPRESSION:1. There is increased uptake on all 3 phases localizing to the left hindfoot region. This may be a manifestation of osteomyelitis. Correlation with exact site of nonhealing ulcer. 2. There is increased uptake localizing to the second phalanx of the left foot on the blood pool and delayed phase images. This is a nonspecific finding and  currently receiving any chemotherapy. HBO contraindications of hyperbaric oxygen therapy were reviewed and the patient found to have no Upper respiratory infection and chronic sinusitis. HBO contraindications of hyperbaric oxygen therapy were reviewed and the patient found to have no history of retinal surgery proceeding 6 weeks or intraocular gas HBO contraindications of hyperbaric oxygen therapy were reviewed and the patient found to have no history seizure disorder or any anticonvulsant medication. HBO contraindications of hyperbaric oxygen therapy were reviewed and the patient found to have no septicemia with CO2 retention. HBO contraindications of hyperbaric oxygen therapy were reviewed and the patient found to have no fever greater than 100 degrees. HBO contraindications of hyperbaric oxygen therapy were reviewed and the patient found to have no pregnancy noted. HBO contraindications of hyperbaric oxygen therapy were reviewed and the patient found to have no medications such as steroids or narcotics or Phenergan. Medications-please add to medication list.: Wound #1 Left Calcaneous: P.O. Antibiotics - start antibiotics ordered by Dr. Ola Spurr Cipro and vancomycin Radiology ordered were: X-ray, Chest ordered were: Pulmonary Function Test [name], [name]  ([id number]) I recommended Prisma AG to all the areas on her gluteal region and the bridge of her nose. She will need silver alginate over her right second toe and the left calcaneal region. I have also asked her to offload from her gluteal area as often as possible and make sure she changes position frequently. I have also asked her to look up her dosage of amiodarone and let us know today. In the meanwhile we'll get a chest x-ray and a PFT done and if necessary refer her to a pulmonologist to clear her to get back into the hyperbaric oxygen therapy chamber. She says she will be compliant and see me back next week. Electronic Signature(s) Signed: 03/10/2015 4:59:10 PM By: Christin Fudge MD, FACS Entered By: Christin Fudge on 03/10/2015 16:23:09 [name] ([id number]) -------------------------------------------------------------------------------- SuperBill Details Patient Name: [name] Date of Service: 03/10/2015 Medical Record Number: [id number] Patient Account Number: [id number] Date of Birth/Sex: 01/02/1956 (59 y.o. 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, 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, stage 3 L89.323 Pressure ulcer of left buttock, stage 3 L97.512 Non-pressure chronic ulcer of other part of right foot with fat layer exposed Facility  Procedures CPT4 Code Description: [id number] - WOUND CARE VISIT-LEV 2 EST PT Modifier: Quantity: 1 CPT4 Code Description: [id number] - DEB SUBQ TISSUE 20 SQ CM/< ICD-10 Description Diagnosis S00.33XA Contusion of nose, initial encounter 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  currently receiving any chemotherapy. HBO contraindications of hyperbaric oxygen therapy were reviewed and the patient found to have no Upper respiratory infection and chronic sinusitis. HBO contraindications of hyperbaric oxygen therapy were reviewed and the patient found to have no history of retinal surgery proceeding 6 weeks or intraocular gas HBO contraindications of hyperbaric oxygen therapy were reviewed and the patient found to have no history seizure disorder or any anticonvulsant medication. HBO contraindications of hyperbaric oxygen therapy were reviewed and the patient found to have no septicemia with CO2 retention. HBO contraindications of hyperbaric oxygen therapy were reviewed and the patient found to have no fever greater than 100 degrees. HBO contraindications of hyperbaric oxygen therapy were reviewed and the patient found to have no pregnancy noted. HBO contraindications of hyperbaric oxygen therapy were reviewed and the patient found to have no medications such as steroids or narcotics or Phenergan. Medications-please add to medication list.: Wound #1 Left Calcaneous: P.O. Antibiotics - start antibiotics ordered by Dr. Ola Spurr Cipro and vancomycin Radiology ordered were: X-ray, Chest ordered were: Pulmonary Function Test [name], [name]  ([id number]) I recommended Prisma AG to all the areas on her gluteal region and the bridge of her nose. She will need silver alginate over her right second toe and the left calcaneal region. I have also asked her to offload from her gluteal area as often as possible and make sure she changes position frequently. I have also asked her to look up her dosage of amiodarone and let us know today. In the meanwhile we'll get a chest x-ray and a PFT done and if necessary refer her to a pulmonologist to clear her to get back into the hyperbaric oxygen therapy chamber. She says she will be compliant and see me back next week. Electronic Signature(s) Signed: 03/10/2015 4:59:10 PM By: Christin Fudge MD, FACS Entered By: Christin Fudge on 03/10/2015 16:23:09 [name] ([id number]) -------------------------------------------------------------------------------- SuperBill Details Patient Name: [name] Date of Service: 03/10/2015 Medical Record Number: [id number] Patient Account Number: [id number] Date of Birth/Sex: 01/02/1956 (59 y.o. 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, 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, stage 3 L89.323 Pressure ulcer of left buttock, stage 3 L97.512 Non-pressure chronic ulcer of other part of right foot with fat layer exposed Facility  Procedures CPT4 Code Description: [id number] - WOUND CARE VISIT-LEV 2 EST PT Modifier: Quantity: 1 CPT4 Code Description: [id number] - DEB SUBQ TISSUE 20 SQ CM/< ICD-10 Description Diagnosis S00.33XA Contusion of nose, initial encounter 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  Jackson Latino., MD Debridement: Debridement Pre-procedure Yes Verification/Time Out Taken: Start Time: 15:25 Pain Control: Other : lidocaine 4% Level: Skin/Subcutaneous Tissue Total Area Debrided (L x 0.8 (cm) x 1.3 (cm) = 1.04 (cm) W): Tissue and other Eschar, Fibrin/Slough, Subcutaneous material debrided: Bleeding: Moderate Hemostasis Achieved: Silver Nitrate End Time: 15:39 Procedural Pain: 0 Post Procedural Pain: 0 Response to Treatment: Procedure was tolerated well Post Debridement Measurements of Total Wound Length: (cm) 0.8 Width: (cm) 1.3 Depth: (cm) 0.1 Volume: (cm) 0.082 Electronic Signature(s) Signed: 03/10/2015 4:59:10 PM By: Christin Fudge MD, FACS Entered By: Christin Fudge on 03/10/2015 15:47:17 [name] ([id number]) -------------------------------------------------------------------------------- HPI Details Patient Name: [name] Date of Service: 03/10/2015 2:30 PM Medical Record Number: [id number] Patient Account Number: [id number] Date of Birth/Sex: 1956-04-08 (59 y.o. Female) Treating RN: Primary Care Physician: [name] Other Clinician: Referring Physician: [name] Treating Physician/Extender: Frann Rider in Treatment: 8 History of Present Illness HPI Description: 59 year old patient who is known to have diabetes for several years and  generally has it under good control last hemoglobin A1c was 6.7 done 2 weeks ago. She has several other comorbidities including acute CHF, hypoxia, hyperlipidemia, systolic heart failure, myocardial infarction, coronary artery disease, iron deficiency anemia, renal insufficiency, essential hypertension, morbid obesity and obstructive sleep apnea. 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 In the past she's had a coronary angioplasty with stent in 2013, laparoscopic cholecystectomy in 2011, bilateral oophorectomy in April 2012 and she's had some lumbar disc surgery in 1996. She says she's had a lot of peripheral neuropathy and due to this she's had a ulcer on her left second toe for a while. She had a dry eschar on her left heel and recently this was removed and she found that she had some infection and some drainage from there. She has minimal pain around the heel but no pain on any areas of her toes. she has not been on any antibiotics recently and has known vascular problems in the recent past. 01/17/2015 left foot x-ray done on April 5 reveals there are destructive changes noted of the distal aspect of the proximal phalanx of the left second digit with extension into the adjacent proximal interphalangeal joint space. These findings suggest osteomyelitis with possible septic arthritis. I was able to make a call to Dr. Nevada Crane the radiologist, and had a detailed discussion with him regarding this x-ray. He noted that there were also some changes in the fifth phalanx on the left foot and this could have been chronic osteomyelitis too. The second toe on the left leg shows findings which cannot differentiate between an acute or chronic osteomyelitis and hence he has recommended a MRI with and without contrast. We will get this organized for her and in the meanwhile treat her with oral antibiotics which is susceptible to Bactrim. she was seen  yesterday by her PCP Dr. Chaya Jan who put her on 3 antibiotics which include Levaquin and doxycycline and Bactrim. 01/24/2015 She is scheduled for a triple PHASE bone scan on 01/26/2015. She is still on antibiotics and otherwise doing well. 01/31/2015 Bone Imaging 3 phase study -- IMPRESSION:1. There is increased uptake on all 3 phases localizing to the left hindfoot region. This may be a manifestation of osteomyelitis. Correlation with exact site of nonhealing ulcer. 2. There is increased uptake localizing to the second phalanx of the left foot on the blood pool and delayed phase images. This is a nonspecific finding and  currently receiving any chemotherapy. HBO contraindications of hyperbaric oxygen therapy were reviewed and the patient found to have no Upper respiratory infection and chronic sinusitis. HBO contraindications of hyperbaric oxygen therapy were reviewed and the patient found to have no history of retinal surgery proceeding 6 weeks or intraocular gas HBO contraindications of hyperbaric oxygen therapy were reviewed and the patient found to have no history seizure disorder or any anticonvulsant medication. HBO contraindications of hyperbaric oxygen therapy were reviewed and the patient found to have no septicemia with CO2 retention. HBO contraindications of hyperbaric oxygen therapy were reviewed and the patient found to have no fever greater than 100 degrees. HBO contraindications of hyperbaric oxygen therapy were reviewed and the patient found to have no pregnancy noted. HBO contraindications of hyperbaric oxygen therapy were reviewed and the patient found to have no medications such as steroids or narcotics or Phenergan. Medications-please add to medication list.: Wound #1 Left Calcaneous: P.O. Antibiotics - start antibiotics ordered by Dr. Ola Spurr Cipro and vancomycin Radiology ordered were: X-ray, Chest ordered were: Pulmonary Function Test [name], [name]  ([id number]) I recommended Prisma AG to all the areas on her gluteal region and the bridge of her nose. She will need silver alginate over her right second toe and the left calcaneal region. I have also asked her to offload from her gluteal area as often as possible and make sure she changes position frequently. I have also asked her to look up her dosage of amiodarone and let us know today. In the meanwhile we'll get a chest x-ray and a PFT done and if necessary refer her to a pulmonologist to clear her to get back into the hyperbaric oxygen therapy chamber. She says she will be compliant and see me back next week. Electronic Signature(s) Signed: 03/10/2015 4:59:10 PM By: Christin Fudge MD, FACS Entered By: Christin Fudge on 03/10/2015 16:23:09 [name] ([id number]) -------------------------------------------------------------------------------- SuperBill Details Patient Name: [name] Date of Service: 03/10/2015 Medical Record Number: [id number] Patient Account Number: [id number] Date of Birth/Sex: 01/02/1956 (59 y.o. 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, 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, stage 3 L89.323 Pressure ulcer of left buttock, stage 3 L97.512 Non-pressure chronic ulcer of other part of right foot with fat layer exposed Facility  Procedures CPT4 Code Description: [id number] - WOUND CARE VISIT-LEV 2 EST PT Modifier: Quantity: 1 CPT4 Code Description: [id number] - DEB SUBQ TISSUE 20 SQ CM/< ICD-10 Description Diagnosis S00.33XA Contusion of nose, initial encounter 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  patient an 8 ounce Glucerna Shake, an 8 ounce Ensure, or 8 ounces of a Glucerna/Ensure equivalent dietary supplement*. B. Wait 30 minutes. If result is 71 mg/dl to 130 mg/dl: C. Retest patientos capillary blood glucose (CBG). D. If result greater than or equal to 110 mg/dl, proceed with HBO. If result less than 110 mg/dl, notify HBO physician and consider holding HBO. If result is 131 mg/dl to 249 mg/dl: A. Proceed with HBO. A. Notify HBO physician and await physician orders. B. It is recommended to hold HBO and do blood/urine ketone If result is 250 mg/dl or greater: testing. C. If the result meets the hospital definition of a critical result, follow hospital policy. POST-HBO GLYCEMIA INTERVENTIONS ACTION INTERVENTION 1 [name], [name]. ([id number]) Obtain post HBO capillary blood glucose (ensure physician order is in chart). A. Notify HBO physician and await physician orders. 2 If result is 70 mg/dl or below: B. If the result meets the hospital definition of a critical result, follow hospital policy. A. Give patient an 8 ounce Glucerna Shake, an 8 ounce Ensure, or 8 ounces of a Glucerna/Ensure equivalent dietary  supplement*. B. Wait 15 minutes for symptoms of hypoglycemia (i.e. nervousness, anxiety, If result is 71 mg/dl to 100 mg/dl: sweating, chills, clamminess, irritability, confusion, tachycardia or dizziness). C. If patient asymptomatic, discharge patient. If patient symptomatic, repeat capillary blood glucose (CBG) and notify HBO physician. If result is 101 mg/dl to 249 mg/dl: A. Discharge patient. A. Notify HBO physician and await physician orders. B. It is recommended to do If result is 250 mg/dl or greater: blood/urine ketone testing. C. If the result meets the hospital definition of a critical result, follow hospital policy. *Juice or candies are NOT equivalent products. If patient refuses the Glucerna or Ensure, please consult the hospital dietitian for an appropriate substitute. Electronic Signature(s) Signed: 03/10/2015 4:59:10 PM By: Christin Fudge MD, FACS Signed: 03/10/2015 5:11:17 PM By: Gretta Cool RN, BSN, Kim RN, BSN Entered By: Gretta Cool, RN, BSN, Kim on 03/10/2015 15:58:03 [name] ([id number]) -------------------------------------------------------------------------------- Problem List Details Patient Name: [name] Date of Service: 03/10/2015 2:30 PM Medical Record Number: [id number] Patient Account Number: [id number] Date of Birth/Sex: 1956/07/14 (59 y.o. Female) Treating RN: Primary Care Physician: [name] Other Clinician: Referring Physician: [name] Treating Physician/Extender: Frann Rider in Treatment: 8 Active Problems ICD-10 Encounter Code Description Active Date Diagnosis E11.621 Type 2 diabetes mellitus with foot ulcer 01/10/2015 Yes L97.422 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 01/10/2015 Yes layer exposed L97.522 Non-pressure chronic ulcer of other part of left foot with fat 01/10/2015 Yes layer exposed [id number] Chronic systolic (congestive) heart failure 01/10/2015 Yes E11.42 Type 2 diabetes mellitus  with diabetic polyneuropathy 01/10/2015 Yes S00.33XA Contusion of nose, initial encounter 01/17/2015 Yes 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, left ankle and foot 02/13/2015 Yes 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, stage 3 03/03/2015 Yes L89.323 Pressure ulcer of left buttock, stage 3 03/03/2015 Yes L97.512 Non-pressure chronic ulcer of other part of right foot with 03/10/2015 Yes fat layer exposed [name], [name] ([id number]) Inactive Problems Resolved Problems Electronic Signature(s) Signed: 03/10/2015 4:59:10 PM By: Christin Fudge MD, FACS Entered By: Christin Fudge on 03/10/2015 15:46:09 [name] ([id number]) -------------------------------------------------------------------------------- Progress Note Details Patient Name: [name] Date of Service: 03/10/2015 2:30 PM Medical Record Number: [id number] Patient Account Number: [id number] Date of Birth/Sex: Mar 15, 1956 (59 y.o. 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 Underlying infection not excluded. I have personally discussed the x-ray studies with the radiologist Dr. Kerby Moors and after comparing all [name], [name]. ([id number]) her films he agrees that the left second phalanx does not have any definite underlying infection and this is now off a chronic arthropathy. There is a increased uptake in all 3 phases localized to the left hindfoot region and this is a manifestation of osteomyelitis. 02/07/2015 -- She was seen by the ID specialist Dr. Ola Spurr who reviewed her on my request recently. He has recommended a PICC line and IV vancomycin and oral ciprofloxacin for 4 weeks. She will be reviewed by him periodically. He also received several notes from Dr. Sonia Baller to help as documented all the requisites for hyperbaric oxygen therapy. 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 He also had a coronary angioplasty with stent placement in October 2013. Prior to that her 2-D echo showed EF of 35%. Her latest 2-D echo done on 11/01/2014 showed a LV EF of 50-55%. Systolic function and wall motion were within normal limits. Chest x-ray done in January 16, 2015 showed no acute findings. Last  hemoglobin A1c done on 12/30/2014 was 6.7. CBC done on the same day was within normal limits, except for a hemoglobin being 8.7 and a hematocrit between 29%. She had arterial duplex study done in August 2015 which showed: No evidence of segmental lower extremity arterial disease at rest, bilaterally. Normal ABI's, bilaterally. Normal great toe pressures, bilaterally. With all the above in place I believe she is a very good candidate for hyperbaric oxygen therapy and I would recommend this with the usual protocol for 6 weeks. she is going to get her PICC line this Friday and the IV antibiotics and when to start. She is also starting on oral ciprofloxacin. 03/03/2015 - recently hospitalized for anemia. No source identified. Started on amiodarone, contraindication to HBO. Reports left heel feels better. Main complaint is new buttocks ulcers. No fever or chills. Minimal drainage. 03/10/2015 -- There is a new ulceration noticed yesterday on her right second toe at the tip. She also has noticed some ulcerations on her gluteal region and we will need to take a look at this. As far as her medications go she does not know for sure what her dosage of amiodarone is exactly. She will look it up and let me know today. Electronic Signature(s) Signed: 03/10/2015 4:59:10 PM By: Christin Fudge MD, FACS Entered By: Christin Fudge on 03/10/2015 15:49:44 [name] ([id number]) -------------------------------------------------------------------------------- Physical Exam Details Patient Name: [name] Date of Service: 03/10/2015 2:30 PM Medical Record Number: [id number] Patient Account Number: [id number] Date of Birth/Sex: [birth year] (59 y.o. Female) Treating RN: Primary Care Physician: [name] Other Clinician: Referring Physician: [name] Treating Physician/Extender: Frann Rider in Treatment: 8 Constitutional . Pulse regular. Respirations normal and unlabored. Afebrile.  . Eyes Nonicteric. Reactive to light. Ears, Nose, Mouth, and Throat the eschar on her nose is now separating and will need sharp debridement.. Neck supple and nontender. No palpable supraclavicular or cervical adenopathy. Normal sized without goiter. Respiratory WNL. No retractions.. Cardiovascular no palpable pedal pulses. bilateral +1 pitting edema. Integumentary (Hair, Skin) The area on her left lateral calcaneal has minimal slough but the area on her right second toe has a lot of eschar and slough and will eat chopped debridement.. No crepitus or fluctuance. No peri-wound warmth or erythema. No  currently receiving any chemotherapy. HBO contraindications of hyperbaric oxygen therapy were reviewed and the patient found to have no Upper respiratory infection and chronic sinusitis. HBO contraindications of hyperbaric oxygen therapy were reviewed and the patient found to have no history of retinal surgery proceeding 6 weeks or intraocular gas HBO contraindications of hyperbaric oxygen therapy were reviewed and the patient found to have no history seizure disorder or any anticonvulsant medication. HBO contraindications of hyperbaric oxygen therapy were reviewed and the patient found to have no septicemia with CO2 retention. HBO contraindications of hyperbaric oxygen therapy were reviewed and the patient found to have no fever greater than 100 degrees. HBO contraindications of hyperbaric oxygen therapy were reviewed and the patient found to have no pregnancy noted. HBO contraindications of hyperbaric oxygen therapy were reviewed and the patient found to have no medications such as steroids or narcotics or Phenergan. Medications-please add to medication list.: Wound #1 Left Calcaneous: P.O. Antibiotics - start antibiotics ordered by Dr. Ola Spurr Cipro and vancomycin Radiology ordered were: X-ray, Chest ordered were: Pulmonary Function Test [name], [name]  ([id number]) I recommended Prisma AG to all the areas on her gluteal region and the bridge of her nose. She will need silver alginate over her right second toe and the left calcaneal region. I have also asked her to offload from her gluteal area as often as possible and make sure she changes position frequently. I have also asked her to look up her dosage of amiodarone and let us know today. In the meanwhile we'll get a chest x-ray and a PFT done and if necessary refer her to a pulmonologist to clear her to get back into the hyperbaric oxygen therapy chamber. She says she will be compliant and see me back next week. Electronic Signature(s) Signed: 03/10/2015 4:59:10 PM By: Christin Fudge MD, FACS Entered By: Christin Fudge on 03/10/2015 16:23:09 [name] ([id number]) -------------------------------------------------------------------------------- SuperBill Details Patient Name: [name] Date of Service: 03/10/2015 Medical Record Number: [id number] Patient Account Number: [id number] Date of Birth/Sex: 01/02/1956 (59 y.o. 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, 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, stage 3 L89.323 Pressure ulcer of left buttock, stage 3 L97.512 Non-pressure chronic ulcer of other part of right foot with fat layer exposed Facility  Procedures CPT4 Code Description: [id number] - WOUND CARE VISIT-LEV 2 EST PT Modifier: Quantity: 1 CPT4 Code Description: [id number] - DEB SUBQ TISSUE 20 SQ CM/< ICD-10 Description Diagnosis S00.33XA Contusion of nose, initial encounter 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  currently receiving any chemotherapy. HBO contraindications of hyperbaric oxygen therapy were reviewed and the patient found to have no Upper respiratory infection and chronic sinusitis. HBO contraindications of hyperbaric oxygen therapy were reviewed and the patient found to have no history of retinal surgery proceeding 6 weeks or intraocular gas HBO contraindications of hyperbaric oxygen therapy were reviewed and the patient found to have no history seizure disorder or any anticonvulsant medication. HBO contraindications of hyperbaric oxygen therapy were reviewed and the patient found to have no septicemia with CO2 retention. HBO contraindications of hyperbaric oxygen therapy were reviewed and the patient found to have no fever greater than 100 degrees. HBO contraindications of hyperbaric oxygen therapy were reviewed and the patient found to have no pregnancy noted. HBO contraindications of hyperbaric oxygen therapy were reviewed and the patient found to have no medications such as steroids or narcotics or Phenergan. Medications-please add to medication list.: Wound #1 Left Calcaneous: P.O. Antibiotics - start antibiotics ordered by Dr. Ola Spurr Cipro and vancomycin Radiology ordered were: X-ray, Chest ordered were: Pulmonary Function Test [name], [name]  ([id number]) I recommended Prisma AG to all the areas on her gluteal region and the bridge of her nose. She will need silver alginate over her right second toe and the left calcaneal region. I have also asked her to offload from her gluteal area as often as possible and make sure she changes position frequently. I have also asked her to look up her dosage of amiodarone and let us know today. In the meanwhile we'll get a chest x-ray and a PFT done and if necessary refer her to a pulmonologist to clear her to get back into the hyperbaric oxygen therapy chamber. She says she will be compliant and see me back next week. Electronic Signature(s) Signed: 03/10/2015 4:59:10 PM By: Christin Fudge MD, FACS Entered By: Christin Fudge on 03/10/2015 16:23:09 [name] ([id number]) -------------------------------------------------------------------------------- SuperBill Details Patient Name: [name] Date of Service: 03/10/2015 Medical Record Number: [id number] Patient Account Number: [id number] Date of Birth/Sex: 01/02/1956 (59 y.o. 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, 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, stage 3 L89.323 Pressure ulcer of left buttock, stage 3 L97.512 Non-pressure chronic ulcer of other part of right foot with fat layer exposed Facility  Procedures CPT4 Code Description: [id number] - WOUND CARE VISIT-LEV 2 EST PT Modifier: Quantity: 1 CPT4 Code Description: [id number] - DEB SUBQ TISSUE 20 SQ CM/< ICD-10 Description Diagnosis S00.33XA Contusion of nose, initial encounter 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

## 2015-03-11 NOTE — Progress Notes (Signed)
the Lower Etiology: Extremity Wound Location: Right Toe Second Wound Open Wounding Event: Gradually Appeared Status: Date Acquired: 03/10/2015 Comorbid Anemia, Arrhythmia, Congestive Heart Weeks Of Treatment: 0 History: Failure, Hypertension, Type II Diabetes Clustered Wound: No Photos Photo Uploaded By: Regan Lemming on 03/10/2015 16:25:05 Wound Measurements Length: (cm) 0.8 Width: (cm) 1.3 Depth: (cm) 0.1 Area: (cm) 0.817 Volume: (cm) 0.082 % Reduction in Area: 0% % Reduction in Volume: 0% Epithelialization: None Tunneling: No Undermining: No Wound Description Classification: Grade 1 Wound Margin: Flat and Intact Exudate Amount: Medium Exudate Type: Serosanguineous Exudate Color: red, brown Foul Odor After Cleansing: No Wound Bed Granulation Amount: None Present (0%) Exposed Structure Necrotic Amount: Large (67-100%) Fascia Exposed: No Necrotic Quality: Eschar Fat Layer Exposed: No Tendon Exposed: No [name], [name]. ([id number]) Muscle Exposed: No Joint Exposed: No Bone Exposed: No Limited to Skin Breakdown Periwound Skin Texture Texture Color No Abnormalities Noted: No No Abnormalities Noted: No Callus: No Atrophie Blanche: No Crepitus: No Cyanosis: No Excoriation: No Ecchymosis: No Fluctuance: No Erythema: No Friable: No Hemosiderin Staining: No Induration: No Mottled: No Localized Edema: No Pallor: No Rash: No Rubor: No Scarring: No Temperature / Pain Moisture Temperature: No Abnormality No Abnormalities Noted: No Dry / Scaly: No Maceration: No Moist: No Wound Preparation Ulcer Cleansing: Rinsed/Irrigated with Saline Topical Anesthetic Applied: Other: lidocaine 4%, Treatment Notes Wound #7 (Right Toe Second) 1. Cleansed with: Clean wound with Normal Saline 2. Anesthetic Topical Lidocaine 4% cream to wound bed prior to debridement 4. Dressing Applied: Aquacel Ag 5.  Secondary Dressing Applied Gauze and Kerlix/Conform 7. Secured with Microbiologist) Signed: 03/10/2015 4:29:11 PM By: Regan Lemming BSN, RN Entered By: Regan Lemming on 03/10/2015 15:15:19 [name] ([id number]) -------------------------------------------------------------------------------- [place] Details Patient Name: [name] Date of Service: 03/10/2015 2:30 PM Medical Record Number: [id number] Patient Account Number: [id number] Date of Birth/Sex: 01-Aug-1956 (59 y.o. Female) Treating RN: Afful, RN, BSN, [place] Primary Care Physician: [name] Other Clinician: Referring Physician: [name] Treating Physician/Extender: Frann Rider in Treatment: 8 Vital Signs Time Taken: 15:09 Temperature (F): 97.5 Height (in): 68 Pulse (bpm): 65 Weight (lbs): 257 Respiratory Rate (breaths/min): 18 Body Mass Index (BMI): 39.1 Blood Pressure (mmHg): 102/56 Reference Range: 80 - 120 mg / dl Electronic Signature(s) Signed: 03/10/2015 4:29:11 PM By: Regan Lemming BSN, RN Entered By: Regan Lemming on 03/10/2015 [id number]  Status: Open Wounding Event: Pressure Injury Date Acquired: 02/21/2015 Weeks Of Treatment: 1 Clustered Wound: No Photos Photo Uploaded By: Regan Lemming on 03/10/2015 16:24:23 Wound Measurements Length: (cm) 2.5 Width: (cm) 2.5 Depth: (cm) 0.1 Area: (cm) 4.909 Volume: (cm) 0.491 % Reduction in Area: 9.4% % Reduction in Volume: 9.4% Wound Description Classification: Category/Stage III Periwound Skin Texture Texture Color No Abnormalities Noted: No No Abnormalities Noted: No Moisture No Abnormalities Noted: No Treatment Notes Wound  #4 (Medial Coccyx) 1. Cleansed with: [name] ([id number]) Clean wound with Normal Saline 2. Anesthetic Topical Lidocaine 4% cream to wound bed prior to debridement 3. Peri-wound Care: Skin Prep 4. Dressing Applied: Prisma Ag 5. Secondary Dressing Applied Bordered Foam Dressing Electronic Signature(s) Signed: 03/10/2015 4:29:11 PM By: Regan Lemming BSN, RN Entered By: Regan Lemming on 03/10/2015 15:19:18 [name] ([id number]) -------------------------------------------------------------------------------- Wound Assessment Details Patient Name: [name] Date of Service: 03/10/2015 2:30 PM Medical Record Number: [id number] Patient Account Number: [id number] Date of Birth/Sex: 02-Apr-1956 (59 y.o. Female) Treating RN: Afful, RN, BSN, [place] Primary Care Physician: [name] Other Clinician: Referring Physician: [name] Treating Physician/Extender: Frann Rider in Treatment: 8 Wound Status Wound Number: 5 Primary Etiology: Pressure Ulcer Wound Location: Right Coccyx Wound Status: Healed - Epithelialized Wounding Event: Pressure Injury Date Acquired: 02/21/2015 Weeks Of Treatment: 1 Clustered Wound: No Photos Photo Uploaded By: Regan Lemming on 03/10/2015 16:25:04 Wound Measurements Length: (cm) 0 % Reduction Width: (cm) 0 % Reduction Depth: (cm) 0 Area: (cm) 0 Volume: (cm) 0 in Area: 100% in Volume: 100% Wound Description Classification: Category/Stage II Periwound Skin Texture Texture Color No Abnormalities Noted: No No Abnormalities Noted: No Moisture No Abnormalities Noted: No Electronic Signature(s) Signed: 03/10/2015 4:29:11 PM By: Regan Lemming BSN, RN [name] ([id number]) Entered By: Regan Lemming on 03/10/2015 15:19:19 [name] ([id number]) -------------------------------------------------------------------------------- Wound Assessment Details Patient Name: [name] Date of Service:  03/10/2015 2:30 PM Medical Record Number: [id number] Patient Account Number: [id number] Date of Birth/Sex: 27-Mar-1956 (59 y.o. Female) Treating RN: Baruch Gouty, RN, BSN, Velva Harman Primary Care Physician: [name] Other Clinician: Referring Physician: [name] Treating Physician/Extender: Frann Rider in Treatment: 8 Wound Status Wound Number: 6 Primary Etiology: Pressure Ulcer Wound Location: Left, Medial Upper Leg Wound Status: Open Wounding Event: Pressure Injury Date Acquired: 02/21/2015 Weeks Of Treatment: 1 Clustered Wound: No Photos Photo Uploaded By: Regan Lemming on 03/10/2015 16:25:05 Wound Measurements Length: (cm) 1 Width: (cm) 4.4 Depth: (cm) 0.1 Area: (cm) 3.456 Volume: (cm) 0.346 % Reduction in Area: 80% % Reduction in Volume: 80% Wound Description Classification: Category/Stage III Periwound Skin Texture Texture Color No Abnormalities Noted: No No Abnormalities Noted: No Moisture No Abnormalities Noted: No Treatment Notes Wound #6 (Left, Medial Upper Leg) 1. Cleansed with: [name] ([id number]) Clean wound with Normal Saline 2. Anesthetic Topical Lidocaine 4% cream to wound bed prior to debridement 3. Peri-wound Care: Skin Prep 4. Dressing Applied: Prisma Ag 5. Secondary Dressing Applied Bordered Foam Dressing Electronic Signature(s) Signed: 03/10/2015 4:29:11 PM By: Regan Lemming BSN, RN Entered By: Regan Lemming on 03/10/2015 15:20:14 [name] ([id number]) -------------------------------------------------------------------------------- Wound Assessment Details Patient Name: [name] Date of Service: 03/10/2015 2:30 PM Medical Record Number: [id number] Patient Account Number: [id number] Date of Birth/Sex: May 29, 1956 (59 y.o. Female) Treating RN: Baruch Gouty, RN, BSN, Velva Harman Primary Care Physician: [name] Other Clinician: Referring Physician: [name] Treating Physician/Extender: Frann Rider in Treatment:  8 Wound Status Wound Number: 7 Primary Diabetic Wound/Ulcer of  of Community assistance (transportation, D/C planning, etc.) 0 []  - Additional assistance / Altered mentation 0 []  - Support Surface(s) Assessment (bed, cushion, seat, etc.) 0 INTERVENTIONS - Wound Cleansing / Measurement []  - Points for Wound Cleaning / Measurement, Wound Dressing, Specimen 0 Collection and Specimen taken to lab can only be taken for a new wound of unknown or different etiology and a procedure is NOT performed to that wound []  - Simple Wound Cleansing - one wound 0 X - Complex Wound Cleansing - multiple wounds 1 5 X - Wound Imaging (photographs - any number of wounds) 1 5 []  - Wound Tracing (instead of photographs) 0 []  - Simple Wound Measurement - one wound 0 X - Complex Wound Measurement - multiple wounds 1 5 INTERVENTIONS - Wound Dressings X - Small Wound Dressing one or multiple wounds 1 10 Xue, Chloey S. ([id number]) []  - Medium Wound Dressing one or multiple wounds 0 []  - Large Wound Dressing one or multiple wounds 0 INTERVENTIONS - Miscellaneous []  - External ear exam 0 []  - Specimen Collection (cultures, biopsies, blood, body fluids, etc.) 0 []  - Specimen(s) / Culture(s) sent or taken to Lab for analysis 0 []  - Patient Transfer (multiple staff / Harrel Lemon Lift / Similar devices) 0 []  - Simple Staple / Suture removal (25 or less) 0 []  - Complex Staple / Suture removal (26 or more) 0 []  - Hypo / Hyperglycemic Management (close monitor of Blood Glucose) 0 []  - Ankle / Brachial Index (ABI) - do not check if billed separately 0 X - Vital Signs 1 5 Has the patient been seen at the hospital within the last three years: Yes Total Score: 70 Level Of Care: New/Established - Level 2 Electronic Signature(s) Signed: 03/10/2015 5:11:17 PM By: Gretta Cool, RN, BSN, Kim RN, BSN Entered By: Gretta Cool, RN, BSN, Kim on 03/10/2015 15:50:40 [name]  ([id number]) -------------------------------------------------------------------------------- Encounter Discharge Information Details Patient Name: [name] Date of Service: 03/10/2015 2:30 PM Medical Record Number: [id number] Patient Account Number: [id number] Date of Birth/Sex: [birth year] (59 y.o. Female) Treating RN: [name] Primary Care Physician: [name] Other Clinician: Referring Physician: [name] Treating Physician/Extender: Frann Rider in Treatment: 8 Encounter Discharge Information Items Discharge Condition: Stable Ambulatory Status: Wheelchair Discharge Destination: Home Transportation: Private Auto Accompanied By: daughter Schedule Follow-up Appointment: Yes Medication Reconciliation completed Yes and provided to Patient/Care Keylor Rands: Provided on Clinical Summary of Care: 03/10/2015 Form Type Recipient Paper Patient BG Electronic Signature(s) Signed: 03/10/2015 4:03:45 PM By: Ruthine Dose Entered By: Ruthine Dose on 03/10/2015 16:03:45 [name] ([id number]) -------------------------------------------------------------------------------- Lower Extremity Assessment Details Patient Name: [name] Date of Service: 03/10/2015 2:30 PM Medical Record Number: [id number] Patient Account Number: [id number] Date of Birth/Sex: [birth year] (59 y.o. Female) Treating RN: Afful, RN, BSN, Velva Harman Primary Care Physician: [name] Other Clinician: Referring Physician: [name] Treating Physician/Extender: Frann Rider in Treatment: 8 Edema Assessment Assessed: [Left: No] [Right: No] Edema: [Left: Yes] [Right: Yes] Vascular Assessment Pulses: Posterior Tibial Dorsalis Pedis Palpable: [Left:Yes] [Right:Yes] Extremity colors, hair growth, and conditions: Extremity Color: [Left:Normal] [Right:Normal] Hair Growth on Extremity: [Left:Yes] [Right:Yes] Temperature of Extremity: [Left:Cool] [Right:Cool] Capillary Refill:  [Left:< 3 seconds] Toe Nail Assessment Left: Right: Thick: Yes Yes Discolored: Yes Yes Deformed: Yes Yes Improper Length and Hygiene: Yes Yes Electronic Signature(s) Signed: 03/10/2015 4:29:11 PM By: Regan Lemming BSN, RN Entered By: Regan Lemming on 03/10/2015 15:16:18 [name] ([id number]) -------------------------------------------------------------------------------- Multi Wound Chart Details Patient Name: [name] Date of Service:  03/10/2015 2:30 PM Medical Record Number: [id number] Patient Account Number: [id number] Date of Birth/Sex: [birth year] (59 y.o. Female) Treating RN: [name] Primary Care Physician: [name] Other Clinician: Referring Physician: [name] Treating Physician/Extender: Frann Rider in Treatment: 8 Vital Signs Height(in): 68 Pulse(bpm): 65 Weight(lbs): 257 Blood Pressure 102/56 (mmHg): Body Mass Index(BMI): 39 Temperature(F): 97.5 Respiratory Rate 18 (breaths/min): Photos: [1:No Photos] [3:No Photos] [4:No Photos] Wound Location: [1:Left Calcaneous] [3:Nose] [4:Medial Coccyx] Wounding Event: [1:Gradually Appeared] [3:Pressure Injury] [4:Pressure Injury] Primary Etiology: [1:Diabetic Wound/Ulcer of the Lower Extremity] [3:Pressure Ulcer] [4:Pressure Ulcer] Comorbid History: [1:Anemia, Arrhythmia, Congestive Heart Failure, Hypertension, Type II Diabetes] [3:Anemia, Arrhythmia, Congestive Heart Failure, Hypertension, Type II Diabetes] [4:N/A] Date Acquired: [1:01/03/2015] [3:11/07/2014] [4:02/21/2015] Weeks of Treatment: [1:8] [3:7] [4:1] Wound Status: [1:Open] [3:Open] [4:Open] Measurements L x W x D 1.4x1.5x0.4 [3:1.2x0.8x0.1] [4:2.5x2.5x0.1] (cm) Area (cm) : [1:1.649] [3:0.754] [4:4.909] Volume (cm) : [1:0.66] [3:0.075] [4:0.491] % Reduction in Area: [1:58.00%] [3:-37.10%] [4:9.40%] % Reduction in Volume: -67.90% [3:31.80%] [4:9.40%] Classification: [1:Grade 2] [3:Category/Stage III] [4:Category/Stage  III] Exudate Amount: [1:Large] [3:None Present] [4:N/A] Exudate Type: [1:Serosanguineous] [3:N/A] [4:N/A] Exudate Color: [1:red, brown] [3:N/A] [4:N/A] Wound Margin: [1:Distinct, outline attached] [3:Indistinct, nonvisible] [4:N/A] Granulation Amount: [1:Large (67-100%)] [3:None Present (0%)] [4:N/A] Granulation Quality: [1:Red] [3:N/A] [4:N/A] Necrotic Amount: [1:Small (1-33%)] [3:Large (67-100%)] [4:N/A] Necrotic Tissue: [1:Adherent Slough] [3:Eschar] [4:N/A] Exposed Structures: [1:Fascia: No Fat: No Tendon: No] [3:Fascia: No Fat: No Tendon: No] [4:N/A] Muscle: No Muscle: No Joint: No Joint: No Bone: No Bone: No Limited to Skin Limited to Skin Breakdown Breakdown Epithelialization: Small (1-33%) Small (1-33%) N/A Periwound Skin Texture: Edema: No Edema: No No Abnormalities Noted Excoriation: No Excoriation: No Induration: No Induration: No Callus: No Callus: No Crepitus: No Crepitus: No Fluctuance: No Fluctuance: No Friable: No Friable: No Rash: No Rash: No Scarring: No Scarring: No Periwound Skin Maceration: Yes Dry/Scaly: Yes No Abnormalities Noted Moisture: Moist: Yes Maceration: No Dry/Scaly: No Moist: No Periwound Skin Color: Atrophie Blanche: No Atrophie Blanche: No No Abnormalities Noted Cyanosis: No Cyanosis: No Ecchymosis: No Ecchymosis: No Erythema: No Erythema: No Hemosiderin Staining: No Hemosiderin Staining: No Mottled: No Mottled: No Pallor: No Pallor: No Rubor: No Rubor: No Temperature: No Abnormality No Abnormality N/A Tenderness on Yes No No Palpation: Wound Preparation: Ulcer Cleansing: Ulcer Cleansing: N/A Rinsed/Irrigated with Rinsed/Irrigated with Saline Saline Topical Anesthetic Topical Anesthetic Applied: Other: lidocaine Applied: None 4% Wound Number: 5 6 7  Photos: No Photos No Photos No Photos Wound Location: Right Coccyx Left, Medial Upper Leg Right Toe Second Wounding Event: Pressure Injury Pressure Injury Gradually  Appeared 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 ([id number]) Measurements L x W x D 0x0x0 1x4.4x0.1 0.8x1.3x0.1 (cm) Area (cm) : 0 3.456 0.817 Volume (cm) : 0 0.346 0.082 % Reduction in Area: 100.00% 80.00% 0.00% % Reduction in Volume: 100.00% 80.00% 0.00% Classification: Category/Stage II Category/Stage III Grade 1 Exudate Amount: N/A N/A Medium Exudate Type: N/A N/A Serosanguineous Exudate Color: N/A N/A red, brown Wound Margin: N/A N/A Flat and Intact Granulation Amount: N/A N/A None Present (0%) Granulation Quality: N/A N/A N/A Necrotic Amount: N/A N/A Large (67-100%) Necrotic Tissue: N/A N/A Eschar Exposed Structures: N/A N/A Fascia: No Fat: No Tendon: No Muscle: No Joint: No Bone: No Limited to Skin Breakdown Epithelialization: N/A N/A None Periwound  of Community assistance (transportation, D/C planning, etc.) 0 []  - Additional assistance / Altered mentation 0 []  - Support Surface(s) Assessment (bed, cushion, seat, etc.) 0 INTERVENTIONS - Wound Cleansing / Measurement []  - Points for Wound Cleaning / Measurement, Wound Dressing, Specimen 0 Collection and Specimen taken to lab can only be taken for a new wound of unknown or different etiology and a procedure is NOT performed to that wound []  - Simple Wound Cleansing - one wound 0 X - Complex Wound Cleansing - multiple wounds 1 5 X - Wound Imaging (photographs - any number of wounds) 1 5 []  - Wound Tracing (instead of photographs) 0 []  - Simple Wound Measurement - one wound 0 X - Complex Wound Measurement - multiple wounds 1 5 INTERVENTIONS - Wound Dressings X - Small Wound Dressing one or multiple wounds 1 10 Xue, Chloey S. ([id number]) []  - Medium Wound Dressing one or multiple wounds 0 []  - Large Wound Dressing one or multiple wounds 0 INTERVENTIONS - Miscellaneous []  - External ear exam 0 []  - Specimen Collection (cultures, biopsies, blood, body fluids, etc.) 0 []  - Specimen(s) / Culture(s) sent or taken to Lab for analysis 0 []  - Patient Transfer (multiple staff / Harrel Lemon Lift / Similar devices) 0 []  - Simple Staple / Suture removal (25 or less) 0 []  - Complex Staple / Suture removal (26 or more) 0 []  - Hypo / Hyperglycemic Management (close monitor of Blood Glucose) 0 []  - Ankle / Brachial Index (ABI) - do not check if billed separately 0 X - Vital Signs 1 5 Has the patient been seen at the hospital within the last three years: Yes Total Score: 70 Level Of Care: New/Established - Level 2 Electronic Signature(s) Signed: 03/10/2015 5:11:17 PM By: Gretta Cool, RN, BSN, Kim RN, BSN Entered By: Gretta Cool, RN, BSN, Kim on 03/10/2015 15:50:40 [name]  ([id number]) -------------------------------------------------------------------------------- Encounter Discharge Information Details Patient Name: [name] Date of Service: 03/10/2015 2:30 PM Medical Record Number: [id number] Patient Account Number: [id number] Date of Birth/Sex: [birth year] (59 y.o. Female) Treating RN: [name] Primary Care Physician: [name] Other Clinician: Referring Physician: [name] Treating Physician/Extender: Frann Rider in Treatment: 8 Encounter Discharge Information Items Discharge Condition: Stable Ambulatory Status: Wheelchair Discharge Destination: Home Transportation: Private Auto Accompanied By: daughter Schedule Follow-up Appointment: Yes Medication Reconciliation completed Yes and provided to Patient/Care Keylor Rands: Provided on Clinical Summary of Care: 03/10/2015 Form Type Recipient Paper Patient BG Electronic Signature(s) Signed: 03/10/2015 4:03:45 PM By: Ruthine Dose Entered By: Ruthine Dose on 03/10/2015 16:03:45 [name] ([id number]) -------------------------------------------------------------------------------- Lower Extremity Assessment Details Patient Name: [name] Date of Service: 03/10/2015 2:30 PM Medical Record Number: [id number] Patient Account Number: [id number] Date of Birth/Sex: [birth year] (59 y.o. Female) Treating RN: Afful, RN, BSN, Velva Harman Primary Care Physician: [name] Other Clinician: Referring Physician: [name] Treating Physician/Extender: Frann Rider in Treatment: 8 Edema Assessment Assessed: [Left: No] [Right: No] Edema: [Left: Yes] [Right: Yes] Vascular Assessment Pulses: Posterior Tibial Dorsalis Pedis Palpable: [Left:Yes] [Right:Yes] Extremity colors, hair growth, and conditions: Extremity Color: [Left:Normal] [Right:Normal] Hair Growth on Extremity: [Left:Yes] [Right:Yes] Temperature of Extremity: [Left:Cool] [Right:Cool] Capillary Refill:  [Left:< 3 seconds] Toe Nail Assessment Left: Right: Thick: Yes Yes Discolored: Yes Yes Deformed: Yes Yes Improper Length and Hygiene: Yes Yes Electronic Signature(s) Signed: 03/10/2015 4:29:11 PM By: Regan Lemming BSN, RN Entered By: Regan Lemming on 03/10/2015 15:16:18 [name] ([id number]) -------------------------------------------------------------------------------- Multi Wound Chart Details Patient Name: [name] Date of Service:  of Community assistance (transportation, D/C planning, etc.) 0 []  - Additional assistance / Altered mentation 0 []  - Support Surface(s) Assessment (bed, cushion, seat, etc.) 0 INTERVENTIONS - Wound Cleansing / Measurement []  - Points for Wound Cleaning / Measurement, Wound Dressing, Specimen 0 Collection and Specimen taken to lab can only be taken for a new wound of unknown or different etiology and a procedure is NOT performed to that wound []  - Simple Wound Cleansing - one wound 0 X - Complex Wound Cleansing - multiple wounds 1 5 X - Wound Imaging (photographs - any number of wounds) 1 5 []  - Wound Tracing (instead of photographs) 0 []  - Simple Wound Measurement - one wound 0 X - Complex Wound Measurement - multiple wounds 1 5 INTERVENTIONS - Wound Dressings X - Small Wound Dressing one or multiple wounds 1 10 Xue, Chloey S. ([id number]) []  - Medium Wound Dressing one or multiple wounds 0 []  - Large Wound Dressing one or multiple wounds 0 INTERVENTIONS - Miscellaneous []  - External ear exam 0 []  - Specimen Collection (cultures, biopsies, blood, body fluids, etc.) 0 []  - Specimen(s) / Culture(s) sent or taken to Lab for analysis 0 []  - Patient Transfer (multiple staff / Harrel Lemon Lift / Similar devices) 0 []  - Simple Staple / Suture removal (25 or less) 0 []  - Complex Staple / Suture removal (26 or more) 0 []  - Hypo / Hyperglycemic Management (close monitor of Blood Glucose) 0 []  - Ankle / Brachial Index (ABI) - do not check if billed separately 0 X - Vital Signs 1 5 Has the patient been seen at the hospital within the last three years: Yes Total Score: 70 Level Of Care: New/Established - Level 2 Electronic Signature(s) Signed: 03/10/2015 5:11:17 PM By: Gretta Cool, RN, BSN, Kim RN, BSN Entered By: Gretta Cool, RN, BSN, Kim on 03/10/2015 15:50:40 [name]  ([id number]) -------------------------------------------------------------------------------- Encounter Discharge Information Details Patient Name: [name] Date of Service: 03/10/2015 2:30 PM Medical Record Number: [id number] Patient Account Number: [id number] Date of Birth/Sex: [birth year] (59 y.o. Female) Treating RN: [name] Primary Care Physician: [name] Other Clinician: Referring Physician: [name] Treating Physician/Extender: Frann Rider in Treatment: 8 Encounter Discharge Information Items Discharge Condition: Stable Ambulatory Status: Wheelchair Discharge Destination: Home Transportation: Private Auto Accompanied By: daughter Schedule Follow-up Appointment: Yes Medication Reconciliation completed Yes and provided to Patient/Care Keylor Rands: Provided on Clinical Summary of Care: 03/10/2015 Form Type Recipient Paper Patient BG Electronic Signature(s) Signed: 03/10/2015 4:03:45 PM By: Ruthine Dose Entered By: Ruthine Dose on 03/10/2015 16:03:45 [name] ([id number]) -------------------------------------------------------------------------------- Lower Extremity Assessment Details Patient Name: [name] Date of Service: 03/10/2015 2:30 PM Medical Record Number: [id number] Patient Account Number: [id number] Date of Birth/Sex: [birth year] (59 y.o. Female) Treating RN: Afful, RN, BSN, Velva Harman Primary Care Physician: [name] Other Clinician: Referring Physician: [name] Treating Physician/Extender: Frann Rider in Treatment: 8 Edema Assessment Assessed: [Left: No] [Right: No] Edema: [Left: Yes] [Right: Yes] Vascular Assessment Pulses: Posterior Tibial Dorsalis Pedis Palpable: [Left:Yes] [Right:Yes] Extremity colors, hair growth, and conditions: Extremity Color: [Left:Normal] [Right:Normal] Hair Growth on Extremity: [Left:Yes] [Right:Yes] Temperature of Extremity: [Left:Cool] [Right:Cool] Capillary Refill:  [Left:< 3 seconds] Toe Nail Assessment Left: Right: Thick: Yes Yes Discolored: Yes Yes Deformed: Yes Yes Improper Length and Hygiene: Yes Yes Electronic Signature(s) Signed: 03/10/2015 4:29:11 PM By: Regan Lemming BSN, RN Entered By: Regan Lemming on 03/10/2015 15:16:18 [name] ([id number]) -------------------------------------------------------------------------------- Multi Wound Chart Details Patient Name: [name] Date of Service:  of Community assistance (transportation, D/C planning, etc.) 0 []  - Additional assistance / Altered mentation 0 []  - Support Surface(s) Assessment (bed, cushion, seat, etc.) 0 INTERVENTIONS - Wound Cleansing / Measurement []  - Points for Wound Cleaning / Measurement, Wound Dressing, Specimen 0 Collection and Specimen taken to lab can only be taken for a new wound of unknown or different etiology and a procedure is NOT performed to that wound []  - Simple Wound Cleansing - one wound 0 X - Complex Wound Cleansing - multiple wounds 1 5 X - Wound Imaging (photographs - any number of wounds) 1 5 []  - Wound Tracing (instead of photographs) 0 []  - Simple Wound Measurement - one wound 0 X - Complex Wound Measurement - multiple wounds 1 5 INTERVENTIONS - Wound Dressings X - Small Wound Dressing one or multiple wounds 1 10 Xue, Chloey S. ([id number]) []  - Medium Wound Dressing one or multiple wounds 0 []  - Large Wound Dressing one or multiple wounds 0 INTERVENTIONS - Miscellaneous []  - External ear exam 0 []  - Specimen Collection (cultures, biopsies, blood, body fluids, etc.) 0 []  - Specimen(s) / Culture(s) sent or taken to Lab for analysis 0 []  - Patient Transfer (multiple staff / Harrel Lemon Lift / Similar devices) 0 []  - Simple Staple / Suture removal (25 or less) 0 []  - Complex Staple / Suture removal (26 or more) 0 []  - Hypo / Hyperglycemic Management (close monitor of Blood Glucose) 0 []  - Ankle / Brachial Index (ABI) - do not check if billed separately 0 X - Vital Signs 1 5 Has the patient been seen at the hospital within the last three years: Yes Total Score: 70 Level Of Care: New/Established - Level 2 Electronic Signature(s) Signed: 03/10/2015 5:11:17 PM By: Gretta Cool, RN, BSN, Kim RN, BSN Entered By: Gretta Cool, RN, BSN, Kim on 03/10/2015 15:50:40 [name]  ([id number]) -------------------------------------------------------------------------------- Encounter Discharge Information Details Patient Name: [name] Date of Service: 03/10/2015 2:30 PM Medical Record Number: [id number] Patient Account Number: [id number] Date of Birth/Sex: [birth year] (59 y.o. Female) Treating RN: [name] Primary Care Physician: [name] Other Clinician: Referring Physician: [name] Treating Physician/Extender: Frann Rider in Treatment: 8 Encounter Discharge Information Items Discharge Condition: Stable Ambulatory Status: Wheelchair Discharge Destination: Home Transportation: Private Auto Accompanied By: daughter Schedule Follow-up Appointment: Yes Medication Reconciliation completed Yes and provided to Patient/Care Keylor Rands: Provided on Clinical Summary of Care: 03/10/2015 Form Type Recipient Paper Patient BG Electronic Signature(s) Signed: 03/10/2015 4:03:45 PM By: Ruthine Dose Entered By: Ruthine Dose on 03/10/2015 16:03:45 [name] ([id number]) -------------------------------------------------------------------------------- Lower Extremity Assessment Details Patient Name: [name] Date of Service: 03/10/2015 2:30 PM Medical Record Number: [id number] Patient Account Number: [id number] Date of Birth/Sex: [birth year] (59 y.o. Female) Treating RN: Afful, RN, BSN, Velva Harman Primary Care Physician: [name] Other Clinician: Referring Physician: [name] Treating Physician/Extender: Frann Rider in Treatment: 8 Edema Assessment Assessed: [Left: No] [Right: No] Edema: [Left: Yes] [Right: Yes] Vascular Assessment Pulses: Posterior Tibial Dorsalis Pedis Palpable: [Left:Yes] [Right:Yes] Extremity colors, hair growth, and conditions: Extremity Color: [Left:Normal] [Right:Normal] Hair Growth on Extremity: [Left:Yes] [Right:Yes] Temperature of Extremity: [Left:Cool] [Right:Cool] Capillary Refill:  [Left:< 3 seconds] Toe Nail Assessment Left: Right: Thick: Yes Yes Discolored: Yes Yes Deformed: Yes Yes Improper Length and Hygiene: Yes Yes Electronic Signature(s) Signed: 03/10/2015 4:29:11 PM By: Regan Lemming BSN, RN Entered By: Regan Lemming on 03/10/2015 15:16:18 [name] ([id number]) -------------------------------------------------------------------------------- Multi Wound Chart Details Patient Name: [name] Date of Service:  Status: Open Wounding Event: Pressure Injury Date Acquired: 02/21/2015 Weeks Of Treatment: 1 Clustered Wound: No Photos Photo Uploaded By: Regan Lemming on 03/10/2015 16:24:23 Wound Measurements Length: (cm) 2.5 Width: (cm) 2.5 Depth: (cm) 0.1 Area: (cm) 4.909 Volume: (cm) 0.491 % Reduction in Area: 9.4% % Reduction in Volume: 9.4% Wound Description Classification: Category/Stage III Periwound Skin Texture Texture Color No Abnormalities Noted: No No Abnormalities Noted: No Moisture No Abnormalities Noted: No Treatment Notes Wound  #4 (Medial Coccyx) 1. Cleansed with: [name] ([id number]) Clean wound with Normal Saline 2. Anesthetic Topical Lidocaine 4% cream to wound bed prior to debridement 3. Peri-wound Care: Skin Prep 4. Dressing Applied: Prisma Ag 5. Secondary Dressing Applied Bordered Foam Dressing Electronic Signature(s) Signed: 03/10/2015 4:29:11 PM By: Regan Lemming BSN, RN Entered By: Regan Lemming on 03/10/2015 15:19:18 [name] ([id number]) -------------------------------------------------------------------------------- Wound Assessment Details Patient Name: [name] Date of Service: 03/10/2015 2:30 PM Medical Record Number: [id number] Patient Account Number: [id number] Date of Birth/Sex: 02-Apr-1956 (59 y.o. Female) Treating RN: Afful, RN, BSN, [place] Primary Care Physician: [name] Other Clinician: Referring Physician: [name] Treating Physician/Extender: Frann Rider in Treatment: 8 Wound Status Wound Number: 5 Primary Etiology: Pressure Ulcer Wound Location: Right Coccyx Wound Status: Healed - Epithelialized Wounding Event: Pressure Injury Date Acquired: 02/21/2015 Weeks Of Treatment: 1 Clustered Wound: No Photos Photo Uploaded By: Regan Lemming on 03/10/2015 16:25:04 Wound Measurements Length: (cm) 0 % Reduction Width: (cm) 0 % Reduction Depth: (cm) 0 Area: (cm) 0 Volume: (cm) 0 in Area: 100% in Volume: 100% Wound Description Classification: Category/Stage II Periwound Skin Texture Texture Color No Abnormalities Noted: No No Abnormalities Noted: No Moisture No Abnormalities Noted: No Electronic Signature(s) Signed: 03/10/2015 4:29:11 PM By: Regan Lemming BSN, RN [name] ([id number]) Entered By: Regan Lemming on 03/10/2015 15:19:19 [name] ([id number]) -------------------------------------------------------------------------------- Wound Assessment Details Patient Name: [name] Date of Service:  03/10/2015 2:30 PM Medical Record Number: [id number] Patient Account Number: [id number] Date of Birth/Sex: 27-Mar-1956 (59 y.o. Female) Treating RN: Baruch Gouty, RN, BSN, Velva Harman Primary Care Physician: [name] Other Clinician: Referring Physician: [name] Treating Physician/Extender: Frann Rider in Treatment: 8 Wound Status Wound Number: 6 Primary Etiology: Pressure Ulcer Wound Location: Left, Medial Upper Leg Wound Status: Open Wounding Event: Pressure Injury Date Acquired: 02/21/2015 Weeks Of Treatment: 1 Clustered Wound: No Photos Photo Uploaded By: Regan Lemming on 03/10/2015 16:25:05 Wound Measurements Length: (cm) 1 Width: (cm) 4.4 Depth: (cm) 0.1 Area: (cm) 3.456 Volume: (cm) 0.346 % Reduction in Area: 80% % Reduction in Volume: 80% Wound Description Classification: Category/Stage III Periwound Skin Texture Texture Color No Abnormalities Noted: No No Abnormalities Noted: No Moisture No Abnormalities Noted: No Treatment Notes Wound #6 (Left, Medial Upper Leg) 1. Cleansed with: [name] ([id number]) Clean wound with Normal Saline 2. Anesthetic Topical Lidocaine 4% cream to wound bed prior to debridement 3. Peri-wound Care: Skin Prep 4. Dressing Applied: Prisma Ag 5. Secondary Dressing Applied Bordered Foam Dressing Electronic Signature(s) Signed: 03/10/2015 4:29:11 PM By: Regan Lemming BSN, RN Entered By: Regan Lemming on 03/10/2015 15:20:14 [name] ([id number]) -------------------------------------------------------------------------------- Wound Assessment Details Patient Name: [name] Date of Service: 03/10/2015 2:30 PM Medical Record Number: [id number] Patient Account Number: [id number] Date of Birth/Sex: May 29, 1956 (59 y.o. Female) Treating RN: Baruch Gouty, RN, BSN, Velva Harman Primary Care Physician: [name] Other Clinician: Referring Physician: [name] Treating Physician/Extender: Frann Rider in Treatment:  8 Wound Status Wound Number: 7 Primary Diabetic Wound/Ulcer of

## 2015-03-13 ENCOUNTER — Telehealth: Payer: Self-pay | Admitting: *Deleted

## 2015-03-13 ENCOUNTER — Inpatient Hospital Stay: Payer: PPO | Attending: Internal Medicine | Admitting: Internal Medicine

## 2015-03-13 ENCOUNTER — Inpatient Hospital Stay: Payer: PPO

## 2015-03-13 VITALS — BP 110/70 | HR 69 | Temp 95.4°F | Resp 18 | Ht 68.0 in | Wt 241.4 lb

## 2015-03-13 DIAGNOSIS — D509 Iron deficiency anemia, unspecified: Secondary | ICD-10-CM | POA: Insufficient documentation

## 2015-03-13 DIAGNOSIS — E780 Pure hypercholesterolemia: Secondary | ICD-10-CM | POA: Insufficient documentation

## 2015-03-13 DIAGNOSIS — E119 Type 2 diabetes mellitus without complications: Secondary | ICD-10-CM | POA: Diagnosis not present

## 2015-03-13 DIAGNOSIS — I10 Essential (primary) hypertension: Secondary | ICD-10-CM | POA: Diagnosis not present

## 2015-03-13 DIAGNOSIS — I5022 Chronic systolic (congestive) heart failure: Secondary | ICD-10-CM | POA: Diagnosis not present

## 2015-03-13 DIAGNOSIS — I2510 Atherosclerotic heart disease of native coronary artery without angina pectoris: Secondary | ICD-10-CM | POA: Diagnosis not present

## 2015-03-13 DIAGNOSIS — R0609 Other forms of dyspnea: Secondary | ICD-10-CM | POA: Insufficient documentation

## 2015-03-13 DIAGNOSIS — Z8719 Personal history of other diseases of the digestive system: Secondary | ICD-10-CM | POA: Insufficient documentation

## 2015-03-13 DIAGNOSIS — E538 Deficiency of other specified B group vitamins: Secondary | ICD-10-CM | POA: Diagnosis not present

## 2015-03-13 DIAGNOSIS — I447 Left bundle-branch block, unspecified: Secondary | ICD-10-CM | POA: Diagnosis not present

## 2015-03-13 DIAGNOSIS — Z7982 Long term (current) use of aspirin: Secondary | ICD-10-CM | POA: Diagnosis not present

## 2015-03-13 DIAGNOSIS — I4891 Unspecified atrial fibrillation: Secondary | ICD-10-CM | POA: Insufficient documentation

## 2015-03-13 DIAGNOSIS — Z79899 Other long term (current) drug therapy: Secondary | ICD-10-CM | POA: Insufficient documentation

## 2015-03-13 DIAGNOSIS — G4730 Sleep apnea, unspecified: Secondary | ICD-10-CM | POA: Insufficient documentation

## 2015-03-13 DIAGNOSIS — G629 Polyneuropathy, unspecified: Secondary | ICD-10-CM | POA: Insufficient documentation

## 2015-03-13 DIAGNOSIS — E669 Obesity, unspecified: Secondary | ICD-10-CM | POA: Insufficient documentation

## 2015-03-13 LAB — CBC WITH DIFFERENTIAL/PLATELET
BASOS ABS: 0 10*3/uL (ref 0–0.1)
Basophils Relative: 1 %
EOS PCT: 2 %
Eosinophils Absolute: 0.1 10*3/uL (ref 0–0.7)
HCT: 30.6 % — ABNORMAL LOW (ref 35.0–47.0)
Hemoglobin: 9.5 g/dL — ABNORMAL LOW (ref 12.0–16.0)
Lymphocytes Relative: 22 %
Lymphs Abs: 1.1 10*3/uL (ref 1.0–3.6)
MCH: 23.3 pg — ABNORMAL LOW (ref 26.0–34.0)
MCHC: 31.1 g/dL — ABNORMAL LOW (ref 32.0–36.0)
MCV: 74.9 fL — AB (ref 80.0–100.0)
MONO ABS: 0.3 10*3/uL (ref 0.2–0.9)
MONOS PCT: 6 %
Neutro Abs: 3.4 10*3/uL (ref 1.4–6.5)
Neutrophils Relative %: 69 %
Platelets: 284 10*3/uL (ref 150–440)
RBC: 4.08 MIL/uL (ref 3.80–5.20)
RDW: 19.7 % — ABNORMAL HIGH (ref 11.5–14.5)
WBC: 4.9 10*3/uL (ref 3.6–11.0)

## 2015-03-13 LAB — RETICULOCYTES
RBC.: 4.08 MIL/uL (ref 3.80–5.20)
Retic Count, Absolute: 61.2 10*3/uL (ref 19.0–183.0)
Retic Ct Pct: 1.5 % (ref 0.4–3.1)

## 2015-03-13 LAB — SAMPLE TO BLOOD BANK

## 2015-03-13 LAB — CREATININE, SERUM
Creatinine, Ser: 1.91 mg/dL — ABNORMAL HIGH (ref 0.44–1.00)
GFR calc non Af Amer: 27 mL/min — ABNORMAL LOW (ref >60)
GFR, EST AFRICAN AMERICAN: 32 mL/min — AB (ref >60)

## 2015-03-13 LAB — DAT, POLYSPECIFIC AHG (ARMC ONLY): POLYSPECIFIC AHG TEST: NEGATIVE

## 2015-03-13 NOTE — Telephone Encounter (Signed)
[name] from [facility] calling to report a level 1 adverse reaction to ciprofloxacin and Amiodarone. She can be reached at [phone].

## 2015-03-14 ENCOUNTER — Ambulatory Visit: Payer: PPO

## 2015-03-16 NOTE — Progress Notes (Signed)
[place]  [phone] [phone]     ID: Van Clines Schliep OB: 03-24-1956  MR#: [id number]  [id number]  Patient Care Team: Wardell Honour, MD as PCP - General (Family Medicine)  CHIEF COMPLAINT/DIAGNOSIS:  Persistent Iron Deficiency Anemia, unresponsive to oral iron therapy (on ferrous sulfate twice a day 1 year, was increased to 3 times a day before recent hospitalization May 2016 for GI bleed and A.fib, received 1 dose of IV iron in hospital).  Patient referred here for hematology evaluation and continuation of parenteral iron therapy.   HISTORY OF PRESENT ILLNESS:  [name] is a 59 year old female with past medical history as detailed below. Patient states that she has had iron deficiency anemia for more than a year, she has been taking oral iron ferrous sulfate twice daily on a regular basis for over a year now. States that more recently she was found to have persistent iron deficiency and dose was increased to 3 times daily. Patient was admitted to the hospital on 02/23/2015 with hemoglobin of 7.8, platelets 191 and was found to have acute on chronic blood loss anemia. She had a EGD on 02/22/2015 which reportedly no abnormalities, colonoscopy was prepped with 2 polyps (tubular adenomas). SPEP was negative for M spike. Iron studies on 02/17/2015 showed ferritin was 120 but serum iron was low at 12 and iron saturation of 80 low at 5%. She got 1 dose of IV mineral for in hospital and is here to complete this therapy and continued hematology evaluation and management of anemia. Patient also had renal insufficiency with creatinine of 2.06 on May 13 when hemoglobin was lowest at 6.4 g, creatinine was improving at 1.53 on May 20. In March 2016 B 12 was normal. States that she continues to have easy fatigability, she has chronic dyspnea on exertion. Denies any ongoing chest pain at rest, orthopnea or PND. Denies any bright red blood in stools or  hematuria at this time. Appetite is steady.  REVIEW OF SYSTEMS:   ROS CONSTITUTIONAL: As in HPI above. No chills, fever or sweats.    ENT:  No headache, dizziness or epistaxis. No ear or jaw pain. No sinus symptoms. RESPIRATORY:   No cough. Chronic dyspnea on exertion. No wheezing. No hemoptysis. CARDIAC:  No palpitations.  No retrosternal chest pain. No orthopnea, PND. GI:  No abdominal pain, nausea or vomiting. No diarrhea.   GU:  No dysuria or hematuria.  SKIN: No rashes or pruritus. HEMATOLOGIC: denies bleeding symptoms MUSCULOSKELETAL:  No new bone pains.  EXTREMITY:  Chronic leg swelling, denies pain.  NEURO:  No focal weakness. No numbness or tingling of extremities.  No seizures.   ENDOCRINE:  No polyuria or polydipsia.   PAST MEDICAL HISTORY: Reviewed Past Medical History  Diagnosis Date  . Obesity, unspecified   . Unspecified vitamin D deficiency   . Pure hypercholesterolemia   . Neuropathy 2011  . Chicken pox   . Chronic systolic CHF (congestive heart failure)     a. mixed ICM & NICM; b. EF 20-25% by echo 07/2012, mid-dist 2/3 of LV sev HK/AK, mild MR. echo 10/2012: EF 30-35%, sev HK ant-septal & inf walls, GR1DD, mild MR, PASP 33. c. echo 02/2013: EF 30%, GR1DD, mild MR. echo 04/2014: EF 30%, Septal-lat dyssynchrony, global HK, inf AK, GR1DD, mild MR. d. echo 10/2014: EF50-55%, WM nl, GR1DD, septal mild paradox. e. echo 02/2015: EF 50-55%, wm   . Hypertension   . Anemia   . Automatic implantable  cardioverter-defibrillator in situ     a. MDT CRT-D 06/2014, [id number]    . Type II diabetes mellitus   . CAD (coronary artery disease)     a. cardiac cath 101/04/2012: PCI/DES to chronically occluded mLAD, consideration PCI to diag branch in 4 weeks.   Marland Kitchen History of blood transfusion ~ 2011    "plasma; had neuropathy; couldn't walk"  . OSA on CPAP     Moderate with AHI 23/hr and now on CPAP at 16cm H2O.  Her DME is [facility]  . LBBB (left bundle branch block)     PAST SURGICAL  HISTORY:Reviewed Past Surgical History  Procedure Laterality Date  . Lumbar disc surgery  1996    L4-5  . Incision and drainage abscess Right 2007    groin; with ICU stay due to sepsis.  . Bilateral oophorectomy  01/2011    ovarian cyst benign  . Bi-ventricular implantable cardioverter defibrillator  (crt-d)  07/06/2014  . Laparoscopic cholecystectomy  2011  . Back surgery    . Coronary angioplasty with stent placement Left 07/2012    new onset systolic CHF; elevated troponins.  Cardiac catheterization with stenting to LAD; EF 15%.  2D-echo: EF 20-25%.  . Vaginal hysterectomy  01/2011    Fibroids/DUB.  Ovaries removed. [place].  . Tubal ligation  1981  . Left heart catheterization with coronary angiogram N/A 07/21/2012    Procedure: LEFT HEART CATHETERIZATION WITH CORONARY ANGIOGRAM;  Surgeon: Jolaine Artist, MD;  Location: [facility] CATH LAB;  Service: Cardiovascular;  Laterality: N/A;  . Percutaneous coronary stent intervention (pci-s) N/A 07/23/2012    Procedure: PERCUTANEOUS CORONARY STENT INTERVENTION (PCI-S);  Surgeon: Sherren Mocha, MD;  Location: [facility] CATH LAB;  Service: Cardiovascular;  Laterality: N/A;  . Bi-ventricular implantable cardioverter defibrillator N/A 07/06/2014    Procedure: BI-VENTRICULAR IMPLANTABLE CARDIOVERTER DEFIBRILLATOR  (CRT-D);  Surgeon: Deboraha Sprang, MD;  Location: [facility] CATH LAB;  Service: Cardiovascular;  Laterality: N/A;  . Colonoscopy with propofol Left 02/22/2015    Procedure: COLONOSCOPY WITH PROPOFOL;  Surgeon: Hulen Luster, MD;  Location: [facility] ENDOSCOPY;  Service: Endoscopy;  Laterality: Left;  . Esophagogastroduodenoscopy N/A 02/22/2015    Procedure: ESOPHAGOGASTRODUODENOSCOPY (EGD);  Surgeon: Hulen Luster, MD;  Location: [facility] ENDOSCOPY;  Service: Endoscopy;  Laterality: N/A;  . Peripheral vascular catheterization N/A 02/10/2015    Procedure: Picc Line Insertion;  Surgeon: Katha Cabal, MD;  Location: [place] CV LAB;  Service: Cardiovascular;   Laterality: N/A;    FAMILY HISTORY:Reviewed Family History  Problem Relation Age of Onset  . Diabetes Mother   . Hypertension Mother   . Arthritis Mother     knees, lumbar DDD, cervical DDD  . Cancer Father     prostate,skin,lymphoma.  . Obesity Brother   . Diabetes Maternal Grandmother   . Diabetes Paternal Grandmother     ADVANCED DIRECTIVES:  Yes  SOCIAL HISTORY: Reviewed History  Substance Use Topics  . Smoking status: Never Smoker   . Smokeless tobacco: Never Used  . Alcohol Use: No    Allergies  Allergen Reactions  . Amiodarone   . Ciprofloxacin     Current Outpatient Prescriptions  Medication Sig Dispense Refill  . amiodarone (PACERONE) 400 MG tablet Take 1 tablet (400 mg total) by mouth 2 (two) times daily. 12 tablet 0  . apixaban (ELIQUIS) 5 MG TABS tablet Take 1 tablet (5 mg total) by mouth 2 (two) times daily. 60 tablet 0  . aspirin EC 81 MG tablet Take 1 tablet (  81 mg total) by mouth daily. 90 tablet 3  . carvedilol (COREG) 25 MG tablet Take 1 tablet (25 mg total) by mouth 2 (two) times daily with a meal. 180 tablet 3  . doxycycline (VIBRAMYCIN) 100 MG capsule Take 1 capsule (100 mg total) by mouth 2 (two) times daily. 20 capsule 0  . ferrous sulfate 325 (65 FE) MG tablet Take 325 mg by mouth 3 (three) times daily with meals.    . fluticasone (FLONASE) 50 MCG/ACT nasal spray 1 spray each nostril twice a day 16 g 6  . gabapentin (NEURONTIN) 600 MG tablet Take 1 tablet (600 mg total) by mouth 3 (three) times daily. 270 tablet 3  . glimepiride (AMARYL) 4 MG tablet Take 1 tablet (4 mg total) by mouth 2 (two) times daily before a meal. 180 tablet 3  . isosorbide mononitrate (IMDUR) 60 MG 24 hr tablet Take 1 tablet (60 mg total) by mouth daily. 90 tablet 3  . levofloxacin (LEVAQUIN) 500 MG tablet Take 1 tablet (500 mg total) by mouth daily. 10 tablet 0  . metFORMIN (GLUCOPHAGE) 1000 MG tablet Take 1 tablet (1,000 mg total) by mouth daily. 90 tablet 0  .  nitroGLYCERIN (NITROSTAT) 0.4 MG SL tablet Place 1 tablet (0.4 mg total) under the tongue every 5 (five) minutes as needed for chest pain. 30 tablet 0  . nortriptyline (PAMELOR) 50 MG capsule Take 1 capsule (50 mg total) by mouth at bedtime. 90 capsule 3  . pravastatin (PRAVACHOL) 40 MG tablet Take 1 tablet (40 mg total) by mouth at bedtime. 90 tablet 3  . sitaGLIPtin (JANUVIA) 100 MG tablet Take 1 tablet (100 mg total) by mouth daily. 90 tablet 0  . torsemide (DEMADEX) 20 MG tablet Take 2 tablets (40 mg total) by mouth daily. 60 tablet 3  . vancomycin 1,500 mg in sodium chloride 0.9 % 500 mL Inject 1,500 mg into the vein every 36 (thirty-six) hours. 10 Dose 0  . amiodarone (PACERONE) 400 MG tablet Take 1 tablet (400 mg total) by mouth daily. (Patient not taking: Reported on 03/13/2015) 10 tablet 0  . apixaban (ELIQUIS) 5 MG TABS tablet Take 2 tablets (10 mg total) by mouth 2 (two) times daily. 28 tablet 0  . ferrous sulfate 325 (65 FE) MG tablet Take 325 mg by mouth 2 (two) times daily with a meal.     No current facility-administered medications for this visit.    OBJECTIVE: Filed Vitals:   03/13/15 1119  BP:   Pulse:   Temp:   Resp: 18     Body mass index is 36.71 kg/(m^2).     GENERAL: Patient is alert and oriented and in no acute distress. There is no icterus. Pallor present. HEENT: EOMs intact. No cervical lymphadenopathy. CVS: S1S2, regular LUNGS: Bilaterally clear to auscultation, no rhonchi. ABDOMEN: Soft, nontender. No hepatosplenomegaly clinically.  NEURO: grossly nonfocal, cranial nerves are intact. Gait unremarkable. EXTREMITIES: Bilateral mild pedal edema. LYMPHATICS: No palpable adenopathy in axillary or inguinal areas. SKIN: No generalized rashes or major bruising. MUSCULOSKELETAL: No obvious joint redness or swelling   LAB RESULTS:     Component Value Date/Time   NA 135 02/23/2015 0455   NA 139 05/18/2014 1114   K 4.0 02/23/2015 0455   CL 101 02/23/2015 0455    CO2 27 02/23/2015 0455   GLUCOSE 199* 02/23/2015 0455   GLUCOSE 71 05/18/2014 1114   BUN 20 02/23/2015 0455   BUN 27 05/18/2014 1114   CREATININE 1.91* 03/13/2015  1242   CREATININE 1.29* 01/16/2015 1358   CALCIUM 7.4* 02/23/2015 0455   PROT 6.5 01/16/2015 1358   PROT 6.8 05/18/2014 1114   ALBUMIN 2.2* 01/16/2015 1358   AST 18 01/16/2015 1358   ALT <8 01/16/2015 1358   ALKPHOS 76 01/16/2015 1358   BILITOT 0.4 01/16/2015 1358   GFRNONAA 27* 03/13/2015 1242   GFRNONAA 46* 12/21/2013 1132   GFRAA 32* 03/13/2015 1242   GFRAA 53* 12/21/2013 1132    No results found for: SPEP, UPEP  Lab Results  Component Value Date   WBC 4.9 03/13/2015   NEUTROABS 3.4 03/13/2015   HGB 9.5* 03/13/2015   HCT 30.6* 03/13/2015   MCV 74.9* 03/13/2015   PLT 284 03/13/2015  March 2016 - serum B-12 normal at 479  May 2016 - SPEP negative for M spike, ferritin 120, serum iron 12, iron saturation 5%. Creatinine was 2.06 on May 13, improved to 1.53 on May 20. Hemoglobin 6.4 on May 13, 7.8 on May 19, 8.6 on May 20.   STUDIES: May 2016 - EGD reported unremarkable. Colonoscopy showed tubular adenoma 2 removed.   ASSESSMENT / PLAN:   1. Persistent Iron Deficiency Anemia, unresponsive to oral iron therapy (on ferrous sulfate twice a day 1 year, was increased to 3 times a day before recent hospitalization May 2016 for GI bleed and A.fib, received 1 dose of IV iron in hospital). Patient referred here for hematology evaluation and continuation of parenteral iron therapy  -  have reviewed records sent benefiting physician, also recent hospitalization. Patient has been unresponsive to oral iron therapy despite increasing doses are fine to 3 times daily. She also has history of recent GI bleed and is being followed by gastroenterology. Patient was explained about significant iron deficiency anemia likely secondary to GI blood loss and also has been unresponsive to oral iron therapy, and that we will need to complete  course of parenteral iron therapy with second dose of IV Venofer 500 mg since hemoglobin is slightly better but still low at 9.5 g. Otherwise WBC count and platelet count are in the normal range. Recent SPEP was negative for M spike, B-12 level was normal. We will schedule IV Venofer 500 mg on June 13 as a 4 hour infusion, patient explained about the rationale and possible side effects, she is agreeable to this and expressed verbal consent. We will then follow up at about 10-12 weeks later with repeat CBC and iron studies and make further plan of management including decide if she would need continued parenteral iron therapy. 2. April 2016 labs also showed ANA + (1:640) and p-ANCA was reported positive. Patient unsure if she has been referred to see rheumatology, we will notify primary physician's office regarding this since she wants them to decide regarding referral to rheumatology.     3. In between visits, the patient has been advised to call or come to the ER in case of fevers, chills, bleeding, acute sickness or new symptoms. Patient is agreeable to this plan.    Leia Alf, MD   03/16/2015 3:27 PM

## 2015-03-17 ENCOUNTER — Encounter: Payer: Self-pay | Admitting: Cardiovascular Disease

## 2015-03-17 ENCOUNTER — Ambulatory Visit (INDEPENDENT_AMBULATORY_CARE_PROVIDER_SITE_OTHER): Payer: PPO | Admitting: Cardiovascular Disease

## 2015-03-17 ENCOUNTER — Encounter: Payer: PPO | Admitting: Surgery

## 2015-03-17 VITALS — BP 109/66 | HR 76 | Ht 68.0 in | Wt 236.0 lb

## 2015-03-17 DIAGNOSIS — I447 Left bundle-branch block, unspecified: Secondary | ICD-10-CM

## 2015-03-17 DIAGNOSIS — L97512 Non-pressure chronic ulcer of other part of right foot with fat layer exposed: Secondary | ICD-10-CM | POA: Diagnosis not present

## 2015-03-17 NOTE — Patient Instructions (Signed)
Medication Instructions: Continue same medications.   Labwork: None.   Procedures/Testing: None.   Follow-Up: 4 months with Dr. Fletcher Anon.   Any Additional Special Instructions Will Be Listed Below (If Applicable).

## 2015-03-17 NOTE — Progress Notes (Signed)
Patient ID: [name], female   DOB: 01/01/1956, 59 y.o.   MRN: [id number]   Primary cardiologist: Dr. Aundra Dubin  HPI: The patient is here for a post [facility] hospital follow up [name] is a 59 y.o. female with past medical history of HL, morbid obesity, chronic pain and DM since she was 59 years old. She had neuropathy in 2011 causing her to be in a wheelchair, this recovered after 8 months of therapy.  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    Admitted to [facility] 10/13 with progressive dyspnea and lower extremity edema.  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  Underwent complex PTCA and stenting of LAD by Dr. Burt Knack.  She was placed on dual antiplatelets therapy.   She has been unable to tolerate spironolactone or lisinopril due to hyperkalemia.  She was hospitalized in May at [facility] for symptomatic anemia. Hemoglobin was 7.3. Dual antiplatelets therapy was stopped. No obvious source of bleeding was identified and currently she is receiving IV iron therapy with improvement in hemoglobin to 9.5. She continues to have slow healing ulcer on the left heel. She had an echocardiogram during her hospitalization which showed an ejection fraction of 50-55% with mild mitral regurgitation. She is overall feeling better with improvement in energy and shortness of breath.  Peripheral arterial dopplers 8/15 with normal ABIs bilaterally  Sleep study 11/15 with severe OSA Echo (1/16) with EF 50-55% Lexiscan Cardiolite in 3/16 with EF 50%, small apical infarct with mild peri-infarct ischemia, low risk study.    Medtronic CRT-D implantation 9/15.     ROS: All systems negative except as listed in HPI, PMH and Problem List.  Past Medical History  Diagnosis Date  . Obesity, unspecified   . Unspecified vitamin D deficiency   . Pure hypercholesterolemia   .  Neuropathy 2011  . Chicken pox   . Chronic systolic CHF (congestive heart failure)     a. mixed ICM & NICM; b. EF 20-25% by echo 07/2012, mid-dist 2/3 of LV sev HK/AK, mild MR. echo 10/2012: EF 30-35%, sev HK ant-septal & inf walls, GR1DD, mild MR, PASP 33. c. echo 02/2013: EF 30%, GR1DD, mild MR. echo 04/2014: EF 30%, Septal-lat dyssynchrony, global HK, inf AK, GR1DD, mild MR. d. echo 10/2014: EF50-55%, WM nl, GR1DD, septal mild paradox. e. echo 02/2015: EF 50-55%, wm   . Hypertension   . Anemia   . Automatic implantable cardioverter-defibrillator in situ     a. MDT CRT-D 06/2014, [id number]    . Type II diabetes mellitus   . CAD (coronary artery disease)     a. cardiac cath 101/04/2012: PCI/DES to chronically occluded mLAD, consideration PCI to diag branch in 4 weeks.   Marland Kitchen History of blood transfusion ~ 2011    "plasma; had neuropathy; couldn't walk"  . OSA on CPAP     Moderate with AHI 23/hr and now on CPAP at 16cm H2O.  Her DME is [facility]  . LBBB (left bundle branch block)     Current Outpatient Prescriptions  Medication Sig Dispense Refill  . aspirin EC 81 MG tablet Take 1 tablet (81 mg total) by mouth daily. 90 tablet 3  . carvedilol (COREG) 25 MG tablet Take 1 tablet (25 mg total) by mouth 2 (two) times daily with a meal. 180 tablet 3  . ciprofloxacin (CIPRO) 500 MG tablet Take 500  mg by mouth 2 (two) times daily.    Marland Kitchen doxycycline (VIBRAMYCIN) 100 MG capsule Take 1 capsule (100 mg total) by mouth 2 (two) times daily. 20 capsule 0  . fluticasone (FLONASE) 50 MCG/ACT nasal spray 1 spray each nostril twice a day 16 g 6  . gabapentin (NEURONTIN) 600 MG tablet Take 1 tablet (600 mg total) by mouth 3 (three) times daily. 270 tablet 3  . glimepiride (AMARYL) 4 MG tablet Take 1 tablet (4 mg total) by mouth 2 (two) times daily before a meal. 180 tablet 3  . isosorbide mononitrate (IMDUR) 60 MG 24 hr tablet Take 1 tablet (60 mg total) by mouth daily. 90 tablet 3  . levofloxacin (LEVAQUIN) 500 MG  tablet Take 1 tablet (500 mg total) by mouth daily. 10 tablet 0  . metFORMIN (GLUCOPHAGE) 1000 MG tablet Take 1 tablet (1,000 mg total) by mouth daily. 90 tablet 0  . nitroGLYCERIN (NITROSTAT) 0.4 MG SL tablet Place 1 tablet (0.4 mg total) under the tongue every 5 (five) minutes as needed for chest pain. 30 tablet 0  . nortriptyline (PAMELOR) 50 MG capsule Take 1 capsule (50 mg total) by mouth at bedtime. 90 capsule 3  . pravastatin (PRAVACHOL) 40 MG tablet Take 1 tablet (40 mg total) by mouth at bedtime. 90 tablet 3  . sitaGLIPtin (JANUVIA) 100 MG tablet Take 1 tablet (100 mg total) by mouth daily. 90 tablet 0  . torsemide (DEMADEX) 20 MG tablet Take 2 tablets (40 mg total) by mouth daily. 60 tablet 3  . vancomycin 1,500 mg in sodium chloride 0.9 % 500 mL Inject 1,500 mg into the vein every 36 (thirty-six) hours. 10 Dose 0   No current facility-administered medications for this visit.    Filed Vitals:   03/17/15 1541  BP: 109/66  Pulse: 76  Height: 5\' 8"  (1.727 m)  Weight: 236 lb (107.049 kg)   PHYSICAL EXAM: General: Obese, no resp difficulty. HEENT: normal  Neck: supple. JVP not elevated. Carotids 2+ bilat; no bruits. No lymphadenopathy or thryomegaly appreciated.  Cor: PMI nondisplaced. Regular rate & rhythm. No S3. Lungs: Slight crackles at bases.   Abdomen: obese, soft, nontender, nondistended. No hepatosplenomegaly. No bruits or masses. Good bowel sounds.  Extremities: no cyanosis, clubbing, rash.  1+ edema 1/2 up lower legs bilaterally.  Neuro: alert & orientedx3  ASSESSMENT & PLAN:  1) Chronic systolic HF: Ischemic cardiomyopathy with EF 30% on 7/15 echo--most recent ejection fraction in May was 50-55% .   s/p Medtronic CRT-D.   NYHA class 3 symptoms . Symptoms improved after treating anemia -Continue carvedilol. Consider resuming hydralazine but blood pressure is soft. - She has not been on ACEI or spironolactone due to severe hyperkalemia and CKD    2) CAD: S/p complex  LAD PCI 07/2012. Lexiscan Cardiolite in 3/16 was low risk.  No recent chest pain.  - Continue statin and aspirin. Given recent anemia suspected to be due to blood loss, I elected not to resume Plavix.   3) Hyperlipidemia: LDL not elevated, TGs quite high in 6/15. Followed by PCP.   The patient continues to follow-up with the heart failure clinic. I will be happy to see her here for convenience if needed given that she lives in [place].  [name] 03/17/2015

## 2015-03-18 NOTE — Progress Notes (Signed)
[id number]) A. Give patient an 8 ounce Glucerna Shake, an 8 ounce Ensure, or 8 ounces of a Glucerna/Ensure equivalent dietary supplement*. B. Wait 15 minutes for symptoms of hypoglycemia (i.e. nervousness, anxiety, sweating, chills, clamminess, irritability, confusion, tachycardia or dizziness). C. If patient asymptomatic, discharge patient. If patient symptomatic, repeat capillary blood glucose (CBG) and notify HBO physician. If result is 101 mg/dl to 249 mg/dl: A. Discharge patient. A. Notify HBO physician and await physician orders. B. It is recommended to do If result is 250 mg/dl or greater: blood/urine ketone testing. C. If the result meets the hospital definition of a critical result, follow hospital policy. *Juice or candies are NOT equivalent products. If patient refuses the Glucerna or Ensure, please consult the hospital dietitian for an appropriate substitute. Electronic Signature(s) Signed: 03/17/2015 12:20:52 PM By: Christin Fudge MD, FACS Signed: 03/17/2015 4:53:24 PM By: Gretta Cool RN, BSN, Kim RN, BSN Entered By: Gretta Cool, RN, BSN, Kim on 03/17/2015 11:25:00 [name] ([id number]) -------------------------------------------------------------------------------- Problem List Details Patient Name: [name] Date of Service: 03/17/2015 11:00 AM Medical Record Number: [id number] Patient Account Number: [id number] Date of Birth/Sex: 02/28/1956 (59 y.o. 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 01/10/2015 Yes layer exposed L97.522 Non-pressure chronic ulcer of other part of left foot with fat 01/10/2015 Yes layer exposed [id number] Chronic systolic (congestive) heart failure 01/10/2015 Yes E11.42 Type 2 diabetes mellitus with diabetic polyneuropathy 01/10/2015 Yes S00.33XA Contusion of nose, initial encounter 01/17/2015 Yes 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, left ankle and foot 02/13/2015 Yes 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, stage 3 03/03/2015 Yes L89.323 Pressure ulcer of left buttock, stage 3 03/03/2015 Yes L97.512 Non-pressure chronic ulcer of other part of right foot with 03/10/2015 Yes fat layer exposed [name] ([id number]) Inactive Problems Resolved Problems Electronic Signature(s) Signed: 03/17/2015 12:20:52 PM By: Christin Fudge MD, FACS Entered By: Christin Fudge on 03/17/2015 11:20:38 [name] ([id number]) -------------------------------------------------------------------------------- Progress Note Details Patient Name: [name] Date of Service: 03/17/2015 11:00 AM Medical Record Number: [id number] Patient Account Number: [id number] Date of Birth/Sex: [birth year] (59 y.o. Female) Treating RN: Primary Care Physician: [name] Other Clinician: Referring Physician: [name] Treating Physician/Extender: Frann Rider in Treatment: 9 Subjective Chief Complaint Information obtained from Patient Patient presents to the wound care center for a consult due non healing wound 59 year old patient who comes with a history of an ulcer on the left second toe for at least about 4 months and a history on her left heel for about 3 weeks. I needed to get an MRI of her left foot but due to her defibrillator  this could not be done and hence she is getting get a triple phase bone scan. 01/17/2015 -- she has developed a significant injury to the bridge of the nose where her CPAP machine was fitting. This is a painfull dark spot. History of Present Illness (HPI) 59 year old patient who is known to have diabetes for several years and generally has it under good control last hemoglobin A1c was 6.7 done 2 weeks ago. She has several other comorbidities including acute CHF, hypoxia, hyperlipidemia, systolic heart failure, myocardial infarction, coronary artery disease, iron deficiency anemia, renal insufficiency, essential hypertension, morbid obesity and obstructive sleep apnea. She also has a  [name], [name] ([id number]) Visit Report for 03/17/2015 Chief Complaint Document Details Patient Name: [name], [name] Date of Service: 03/17/2015 11:00 AM Medical Record Number: [id number] Patient Account Number: [id number] Date of Birth/Sex: 1955-11-16 (59 y.o. Female) Treating RN: Primary Care Physician: [name] Other Clinician: Referring Physician: [name] Treating Physician/Extender: Frann Rider in Treatment: 9 Information Obtained from: Patient Chief Complaint Patient presents to the wound care center for a consult due non healing wound 59 year old patient who comes with a history of an ulcer on the left second toe for at least about 4 months and a history on her left heel for about 3 weeks. I needed to get an MRI of her left foot but due to her defibrillator this could not be done and hence she is getting get a triple phase bone scan. 01/17/2015 -- she has developed a significant injury to the bridge of the nose where her CPAP machine was fitting. This is a painfull dark spot. Electronic Signature(s) Signed: 03/17/2015 12:20:52 PM By: Christin Fudge MD, FACS Entered By: Christin Fudge on 03/17/2015 11:21:19 [name] ([id number]) -------------------------------------------------------------------------------- Debridement Details Patient Name: [name] Date of Service: 03/17/2015 11:00 AM Medical Record Number: [id number] Patient Account Number: [id number] Date of Birth/Sex: 1956/07/20 (59 y.o. Female) Treating RN: Primary Care Physician: [name] Other Clinician: Referring Physician: [name] Treating Physician/Extender: Frann Rider in Treatment: 9 Debridement Performed for Wound #1 Left Calcaneous Assessment: Performed By: Physician Pat Patrick., MD Debridement: Debridement Pre-procedure Yes Verification/Time Out Taken: Start Time: 11:10 Pain Control: Other : lidocaine 4% Level: Skin/Subcutaneous  Tissue Total Area Debrided (L x 1 (cm) x 1 (cm) = 1 (cm) W): Tissue and other Viable, Non-Viable, Eschar, Exudate, Fibrin/Slough, Subcutaneous material debrided: Instrument: Curette Bleeding: Minimum Hemostasis Achieved: Pressure End Time: 11:13 Procedural Pain: 0 Post Procedural Pain: 0 Response to Treatment: Procedure was tolerated well Post Debridement Measurements of Total Wound Length: (cm) 1 Width: (cm) 1 Depth: (cm) 0.4 Volume: (cm) 0.314 Electronic Signature(s) Signed: 03/17/2015 12:20:52 PM By: Christin Fudge MD, FACS Entered By: Christin Fudge on 03/17/2015 11:20:53 [name] ([id number]) -------------------------------------------------------------------------------- Debridement Details Patient Name: [name] Date of Service: 03/17/2015 11:00 AM Medical Record Number: [id number] Patient Account Number: [id number] Date of Birth/Sex: 03-19-1956 (59 y.o. Female) Treating RN: Primary Care Physician: [name] Other Clinician: Referring Physician: [name] Treating Physician/Extender: Frann Rider in Treatment: 9 Debridement Performed for Wound #7 Right Toe Second Assessment: Performed By: Physician Pat Patrick., MD Debridement: Debridement Pre-procedure Yes Verification/Time Out Taken: Start Time: 11:10 Pain Control: Other : lidocaine 4% Level: Skin/Subcutaneous Tissue Total Area Debrided (L x 1 (cm) x 1.5 (cm) = 1.5 (cm) W): Tissue and other Viable, Non-Viable, Exudate, Fibrin/Slough, Subcutaneous material debrided: Instrument: Curette Bleeding: Minimum Hemostasis Achieved: Pressure End Time: 11:13 Procedural Pain: 0 Post Procedural Pain: 0 Response to Treatment: Procedure was tolerated well Post Debridement Measurements of Total Wound Length: (cm) 1 Width: (cm) 1.5 Depth: (cm) 0.2 Volume: (cm) 0.236 Electronic Signature(s) Signed: 03/17/2015 12:20:52 PM By: Christin Fudge MD, FACS Entered By: Christin Fudge  on 03/17/2015 11:21:05 [name] ([id number]) -------------------------------------------------------------------------------- HPI Details Patient Name: [name] Date of Service: 03/17/2015 11:00 AM Medical Record Number: [id number] Patient Account Number: [id number] Date of Birth/Sex: 1956/03/08 (59 y.o. Female) Treating RN: Primary Care Physician: [name] Other Clinician: Referring Physician: [name] Treating Physician/Extender: Frann Rider in Treatment: 9 History of Present Illness HPI Description: 59 year old patient who is known to  that her 2-D echo showed EF of 35%. Her latest 2-D echo done on 11/01/2014 showed a LV EF of 50-55%. Systolic function and wall motion were within normal limits. Chest x-ray done in January 16, 2015 showed no acute findings. Last hemoglobin A1c done on 12/30/2014 was 6.7. CBC done on the same day was within normal limits, except for a hemoglobin being 8.7 and a hematocrit between 29%. She had arterial duplex study done in August 2015 which showed: No evidence of segmental lower extremity arterial disease at rest, bilaterally. Normal ABI's, bilaterally. Normal great toe pressures, bilaterally. With all the above in place I believe she is a very good candidate for hyperbaric oxygen therapy and I would recommend this with the usual protocol for 6 weeks. she is going to get her PICC line this Friday and the IV antibiotics and when to start. She is also starting on oral ciprofloxacin. 03/03/2015 - recently hospitalized for anemia. No source identified. Started on amiodarone, contraindication to HBO. Reports left heel feels better. Main complaint is new buttocks ulcers. No fever or chills. Minimal drainage. 03/10/2015 -- There is a new ulceration noticed yesterday on her right second toe at the tip. She also has noticed some ulcerations on her gluteal region and we will need to take a look at this. As far as her medications go she does not know for sure what her dosage of amiodarone is exactly. She will look it up and let me know today. 03/17/2015 -- as far as her dosage of amiodarone she was taking : 02/25/15 - 03/02/15 400 mg per  day; 03/03/15 - 03/10/15 200 mg per day. She stopped taking the medication on 03/10/15. She has not yet done the checks x-ray and her PFTs are still pending. Recent Lab work done on 03/13/2015 shows a glucose is 124, K+ was 3.3, her albumen was 2.9, WBC count was 3.4, hemoglobin was 9.3, hematocrit was 30.4 and platelets were 240. C-reactive protein was 33.7 which was high and the Vancomycin trough was 18.6 which was high Electronic Signature(s) Signed: 03/17/2015 12:20:52 PM By: Christin Fudge MD, FACS Entered By: Christin Fudge on 03/17/2015 11:21:27 [name] ([id number]) -------------------------------------------------------------------------------- Physical Exam Details Patient Name: [name] Date of Service: 03/17/2015 11:00 AM Medical Record Number: [id number] Patient Account Number: [id number] Date of Birth/Sex: [birth year] (59 y.o. Female) Treating RN: Primary Care Physician: [name] Other Clinician: Referring Physician: [name] Treating Physician/Extender: Frann Rider in Treatment: 9 Constitutional . Pulse regular. Respirations normal and unlabored. Afebrile. . Eyes Nonicteric. Reactive to light. Ears, Nose, Mouth, and Throat the bridge of the nose has formed an eschar again and this is possibly due to it drying out with Prisma.. Neck supple and nontender. No palpable supraclavicular or cervical adenopathy. Normal sized without goiter. Respiratory WNL. No retractions.. Cardiovascular Pedal Pulses WNL. No clubbing, cyanosis or edema. Musculoskeletal Adexa without tenderness or enlargement.. Digits and nails w/o clubbing, cyanosis, infection, petechiae, ischemia, or inflammatory conditions.. Integumentary (Hair, Skin) The bridge of the nose has dried out into an eschar again and we will need to paint this with Betadine. after removing the eschar formed her right second toe and from the left calcaneal region overall she seems to have good  granulation tissue underneath this and we would use Prisma over these 2 wounds.. No crepitus or fluctuance. No peri-wound warmth or erythema. No masses.Marland Kitchen Psychiatric Judgement and insight Intact.. No evidence of depression, anxiety, or agitation.. Electronic Signature(s) Signed: 03/17/2015 12:20:52 PM By: Christin Fudge  that her 2-D echo showed EF of 35%. Her latest 2-D echo done on 11/01/2014 showed a LV EF of 50-55%. Systolic function and wall motion were within normal limits. Chest x-ray done in January 16, 2015 showed no acute findings. Last hemoglobin A1c done on 12/30/2014 was 6.7. CBC done on the same day was within normal limits, except for a hemoglobin being 8.7 and a hematocrit between 29%. She had arterial duplex study done in August 2015 which showed: No evidence of segmental lower extremity arterial disease at rest, bilaterally. Normal ABI's, bilaterally. Normal great toe pressures, bilaterally. With all the above in place I believe she is a very good candidate for hyperbaric oxygen therapy and I would recommend this with the usual protocol for 6 weeks. she is going to get her PICC line this Friday and the IV antibiotics and when to start. She is also starting on oral ciprofloxacin. 03/03/2015 - recently hospitalized for anemia. No source identified. Started on amiodarone, contraindication to HBO. Reports left heel feels better. Main complaint is new buttocks ulcers. No fever or chills. Minimal drainage. 03/10/2015 -- There is a new ulceration noticed yesterday on her right second toe at the tip. She also has noticed some ulcerations on her gluteal region and we will need to take a look at this. As far as her medications go she does not know for sure what her dosage of amiodarone is exactly. She will look it up and let me know today. 03/17/2015 -- as far as her dosage of amiodarone she was taking : 02/25/15 - 03/02/15 400 mg per  day; 03/03/15 - 03/10/15 200 mg per day. She stopped taking the medication on 03/10/15. She has not yet done the checks x-ray and her PFTs are still pending. Recent Lab work done on 03/13/2015 shows a glucose is 124, K+ was 3.3, her albumen was 2.9, WBC count was 3.4, hemoglobin was 9.3, hematocrit was 30.4 and platelets were 240. C-reactive protein was 33.7 which was high and the Vancomycin trough was 18.6 which was high Electronic Signature(s) Signed: 03/17/2015 12:20:52 PM By: Christin Fudge MD, FACS Entered By: Christin Fudge on 03/17/2015 11:21:27 [name] ([id number]) -------------------------------------------------------------------------------- Physical Exam Details Patient Name: [name] Date of Service: 03/17/2015 11:00 AM Medical Record Number: [id number] Patient Account Number: [id number] Date of Birth/Sex: [birth year] (59 y.o. Female) Treating RN: Primary Care Physician: [name] Other Clinician: Referring Physician: [name] Treating Physician/Extender: Frann Rider in Treatment: 9 Constitutional . Pulse regular. Respirations normal and unlabored. Afebrile. . Eyes Nonicteric. Reactive to light. Ears, Nose, Mouth, and Throat the bridge of the nose has formed an eschar again and this is possibly due to it drying out with Prisma.. Neck supple and nontender. No palpable supraclavicular or cervical adenopathy. Normal sized without goiter. Respiratory WNL. No retractions.. Cardiovascular Pedal Pulses WNL. No clubbing, cyanosis or edema. Musculoskeletal Adexa without tenderness or enlargement.. Digits and nails w/o clubbing, cyanosis, infection, petechiae, ischemia, or inflammatory conditions.. Integumentary (Hair, Skin) The bridge of the nose has dried out into an eschar again and we will need to paint this with Betadine. after removing the eschar formed her right second toe and from the left calcaneal region overall she seems to have good  granulation tissue underneath this and we would use Prisma over these 2 wounds.. No crepitus or fluctuance. No peri-wound warmth or erythema. No masses.Marland Kitchen Psychiatric Judgement and insight Intact.. No evidence of depression, anxiety, or agitation.. Electronic Signature(s) Signed: 03/17/2015 12:20:52 PM By: Christin Fudge  [id number]) A. Give patient an 8 ounce Glucerna Shake, an 8 ounce Ensure, or 8 ounces of a Glucerna/Ensure equivalent dietary supplement*. B. Wait 15 minutes for symptoms of hypoglycemia (i.e. nervousness, anxiety, sweating, chills, clamminess, irritability, confusion, tachycardia or dizziness). C. If patient asymptomatic, discharge patient. If patient symptomatic, repeat capillary blood glucose (CBG) and notify HBO physician. If result is 101 mg/dl to 249 mg/dl: A. Discharge patient. A. Notify HBO physician and await physician orders. B. It is recommended to do If result is 250 mg/dl or greater: blood/urine ketone testing. C. If the result meets the hospital definition of a critical result, follow hospital policy. *Juice or candies are NOT equivalent products. If patient refuses the Glucerna or Ensure, please consult the hospital dietitian for an appropriate substitute. Electronic Signature(s) Signed: 03/17/2015 12:20:52 PM By: Christin Fudge MD, FACS Signed: 03/17/2015 4:53:24 PM By: Gretta Cool RN, BSN, Kim RN, BSN Entered By: Gretta Cool, RN, BSN, Kim on 03/17/2015 11:25:00 [name] ([id number]) -------------------------------------------------------------------------------- Problem List Details Patient Name: [name] Date of Service: 03/17/2015 11:00 AM Medical Record Number: [id number] Patient Account Number: [id number] Date of Birth/Sex: 02/28/1956 (59 y.o. 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 01/10/2015 Yes layer exposed L97.522 Non-pressure chronic ulcer of other part of left foot with fat 01/10/2015 Yes layer exposed [id number] Chronic systolic (congestive) heart failure 01/10/2015 Yes E11.42 Type 2 diabetes mellitus with diabetic polyneuropathy 01/10/2015 Yes S00.33XA Contusion of nose, initial encounter 01/17/2015 Yes 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, left ankle and foot 02/13/2015 Yes 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, stage 3 03/03/2015 Yes L89.323 Pressure ulcer of left buttock, stage 3 03/03/2015 Yes L97.512 Non-pressure chronic ulcer of other part of right foot with 03/10/2015 Yes fat layer exposed [name] ([id number]) Inactive Problems Resolved Problems Electronic Signature(s) Signed: 03/17/2015 12:20:52 PM By: Christin Fudge MD, FACS Entered By: Christin Fudge on 03/17/2015 11:20:38 [name] ([id number]) -------------------------------------------------------------------------------- Progress Note Details Patient Name: [name] Date of Service: 03/17/2015 11:00 AM Medical Record Number: [id number] Patient Account Number: [id number] Date of Birth/Sex: [birth year] (59 y.o. Female) Treating RN: Primary Care Physician: [name] Other Clinician: Referring Physician: [name] Treating Physician/Extender: Frann Rider in Treatment: 9 Subjective Chief Complaint Information obtained from Patient Patient presents to the wound care center for a consult due non healing wound 59 year old patient who comes with a history of an ulcer on the left second toe for at least about 4 months and a history on her left heel for about 3 weeks. I needed to get an MRI of her left foot but due to her defibrillator  this could not be done and hence she is getting get a triple phase bone scan. 01/17/2015 -- she has developed a significant injury to the bridge of the nose where her CPAP machine was fitting. This is a painfull dark spot. History of Present Illness (HPI) 59 year old patient who is known to have diabetes for several years and generally has it under good control last hemoglobin A1c was 6.7 done 2 weeks ago. She has several other comorbidities including acute CHF, hypoxia, hyperlipidemia, systolic heart failure, myocardial infarction, coronary artery disease, iron deficiency anemia, renal insufficiency, essential hypertension, morbid obesity and obstructive sleep apnea. She also has a  that her 2-D echo showed EF of 35%. Her latest 2-D echo done on 11/01/2014 showed a LV EF of 50-55%. Systolic function and wall motion were within normal limits. Chest x-ray done in January 16, 2015 showed no acute findings. Last hemoglobin A1c done on 12/30/2014 was 6.7. CBC done on the same day was within normal limits, except for a hemoglobin being 8.7 and a hematocrit between 29%. She had arterial duplex study done in August 2015 which showed: No evidence of segmental lower extremity arterial disease at rest, bilaterally. Normal ABI's, bilaterally. Normal great toe pressures, bilaterally. With all the above in place I believe she is a very good candidate for hyperbaric oxygen therapy and I would recommend this with the usual protocol for 6 weeks. she is going to get her PICC line this Friday and the IV antibiotics and when to start. She is also starting on oral ciprofloxacin. 03/03/2015 - recently hospitalized for anemia. No source identified. Started on amiodarone, contraindication to HBO. Reports left heel feels better. Main complaint is new buttocks ulcers. No fever or chills. Minimal drainage. 03/10/2015 -- There is a new ulceration noticed yesterday on her right second toe at the tip. She also has noticed some ulcerations on her gluteal region and we will need to take a look at this. As far as her medications go she does not know for sure what her dosage of amiodarone is exactly. She will look it up and let me know today. 03/17/2015 -- as far as her dosage of amiodarone she was taking : 02/25/15 - 03/02/15 400 mg per  day; 03/03/15 - 03/10/15 200 mg per day. She stopped taking the medication on 03/10/15. She has not yet done the checks x-ray and her PFTs are still pending. Recent Lab work done on 03/13/2015 shows a glucose is 124, K+ was 3.3, her albumen was 2.9, WBC count was 3.4, hemoglobin was 9.3, hematocrit was 30.4 and platelets were 240. C-reactive protein was 33.7 which was high and the Vancomycin trough was 18.6 which was high Electronic Signature(s) Signed: 03/17/2015 12:20:52 PM By: Christin Fudge MD, FACS Entered By: Christin Fudge on 03/17/2015 11:21:27 [name] ([id number]) -------------------------------------------------------------------------------- Physical Exam Details Patient Name: [name] Date of Service: 03/17/2015 11:00 AM Medical Record Number: [id number] Patient Account Number: [id number] Date of Birth/Sex: [birth year] (59 y.o. Female) Treating RN: Primary Care Physician: [name] Other Clinician: Referring Physician: [name] Treating Physician/Extender: Frann Rider in Treatment: 9 Constitutional . Pulse regular. Respirations normal and unlabored. Afebrile. . Eyes Nonicteric. Reactive to light. Ears, Nose, Mouth, and Throat the bridge of the nose has formed an eschar again and this is possibly due to it drying out with Prisma.. Neck supple and nontender. No palpable supraclavicular or cervical adenopathy. Normal sized without goiter. Respiratory WNL. No retractions.. Cardiovascular Pedal Pulses WNL. No clubbing, cyanosis or edema. Musculoskeletal Adexa without tenderness or enlargement.. Digits and nails w/o clubbing, cyanosis, infection, petechiae, ischemia, or inflammatory conditions.. Integumentary (Hair, Skin) The bridge of the nose has dried out into an eschar again and we will need to paint this with Betadine. after removing the eschar formed her right second toe and from the left calcaneal region overall she seems to have good  granulation tissue underneath this and we would use Prisma over these 2 wounds.. No crepitus or fluctuance. No peri-wound warmth or erythema. No masses.Marland Kitchen Psychiatric Judgement and insight Intact.. No evidence of depression, anxiety, or agitation.. Electronic Signature(s) Signed: 03/17/2015 12:20:52 PM By: Christin Fudge  [name], [name] ([id number]) Visit Report for 03/17/2015 Chief Complaint Document Details Patient Name: [name], [name] Date of Service: 03/17/2015 11:00 AM Medical Record Number: [id number] Patient Account Number: [id number] Date of Birth/Sex: 1955-11-16 (59 y.o. Female) Treating RN: Primary Care Physician: [name] Other Clinician: Referring Physician: [name] Treating Physician/Extender: Frann Rider in Treatment: 9 Information Obtained from: Patient Chief Complaint Patient presents to the wound care center for a consult due non healing wound 59 year old patient who comes with a history of an ulcer on the left second toe for at least about 4 months and a history on her left heel for about 3 weeks. I needed to get an MRI of her left foot but due to her defibrillator this could not be done and hence she is getting get a triple phase bone scan. 01/17/2015 -- she has developed a significant injury to the bridge of the nose where her CPAP machine was fitting. This is a painfull dark spot. Electronic Signature(s) Signed: 03/17/2015 12:20:52 PM By: Christin Fudge MD, FACS Entered By: Christin Fudge on 03/17/2015 11:21:19 [name] ([id number]) -------------------------------------------------------------------------------- Debridement Details Patient Name: [name] Date of Service: 03/17/2015 11:00 AM Medical Record Number: [id number] Patient Account Number: [id number] Date of Birth/Sex: 1956/07/20 (59 y.o. Female) Treating RN: Primary Care Physician: [name] Other Clinician: Referring Physician: [name] Treating Physician/Extender: Frann Rider in Treatment: 9 Debridement Performed for Wound #1 Left Calcaneous Assessment: Performed By: Physician Pat Patrick., MD Debridement: Debridement Pre-procedure Yes Verification/Time Out Taken: Start Time: 11:10 Pain Control: Other : lidocaine 4% Level: Skin/Subcutaneous  Tissue Total Area Debrided (L x 1 (cm) x 1 (cm) = 1 (cm) W): Tissue and other Viable, Non-Viable, Eschar, Exudate, Fibrin/Slough, Subcutaneous material debrided: Instrument: Curette Bleeding: Minimum Hemostasis Achieved: Pressure End Time: 11:13 Procedural Pain: 0 Post Procedural Pain: 0 Response to Treatment: Procedure was tolerated well Post Debridement Measurements of Total Wound Length: (cm) 1 Width: (cm) 1 Depth: (cm) 0.4 Volume: (cm) 0.314 Electronic Signature(s) Signed: 03/17/2015 12:20:52 PM By: Christin Fudge MD, FACS Entered By: Christin Fudge on 03/17/2015 11:20:53 [name] ([id number]) -------------------------------------------------------------------------------- Debridement Details Patient Name: [name] Date of Service: 03/17/2015 11:00 AM Medical Record Number: [id number] Patient Account Number: [id number] Date of Birth/Sex: 03-19-1956 (59 y.o. Female) Treating RN: Primary Care Physician: [name] Other Clinician: Referring Physician: [name] Treating Physician/Extender: Frann Rider in Treatment: 9 Debridement Performed for Wound #7 Right Toe Second Assessment: Performed By: Physician Pat Patrick., MD Debridement: Debridement Pre-procedure Yes Verification/Time Out Taken: Start Time: 11:10 Pain Control: Other : lidocaine 4% Level: Skin/Subcutaneous Tissue Total Area Debrided (L x 1 (cm) x 1.5 (cm) = 1.5 (cm) W): Tissue and other Viable, Non-Viable, Exudate, Fibrin/Slough, Subcutaneous material debrided: Instrument: Curette Bleeding: Minimum Hemostasis Achieved: Pressure End Time: 11:13 Procedural Pain: 0 Post Procedural Pain: 0 Response to Treatment: Procedure was tolerated well Post Debridement Measurements of Total Wound Length: (cm) 1 Width: (cm) 1.5 Depth: (cm) 0.2 Volume: (cm) 0.236 Electronic Signature(s) Signed: 03/17/2015 12:20:52 PM By: Christin Fudge MD, FACS Entered By: Christin Fudge  on 03/17/2015 11:21:05 [name] ([id number]) -------------------------------------------------------------------------------- HPI Details Patient Name: [name] Date of Service: 03/17/2015 11:00 AM Medical Record Number: [id number] Patient Account Number: [id number] Date of Birth/Sex: 1956/03/08 (59 y.o. Female) Treating RN: Primary Care Physician: [name] Other Clinician: Referring Physician: [name] Treating Physician/Extender: Frann Rider in Treatment: 9 History of Present Illness HPI Description: 59 year old patient who is known to  [id number]) A. Give patient an 8 ounce Glucerna Shake, an 8 ounce Ensure, or 8 ounces of a Glucerna/Ensure equivalent dietary supplement*. B. Wait 15 minutes for symptoms of hypoglycemia (i.e. nervousness, anxiety, sweating, chills, clamminess, irritability, confusion, tachycardia or dizziness). C. If patient asymptomatic, discharge patient. If patient symptomatic, repeat capillary blood glucose (CBG) and notify HBO physician. If result is 101 mg/dl to 249 mg/dl: A. Discharge patient. A. Notify HBO physician and await physician orders. B. It is recommended to do If result is 250 mg/dl or greater: blood/urine ketone testing. C. If the result meets the hospital definition of a critical result, follow hospital policy. *Juice or candies are NOT equivalent products. If patient refuses the Glucerna or Ensure, please consult the hospital dietitian for an appropriate substitute. Electronic Signature(s) Signed: 03/17/2015 12:20:52 PM By: Christin Fudge MD, FACS Signed: 03/17/2015 4:53:24 PM By: Gretta Cool RN, BSN, Kim RN, BSN Entered By: Gretta Cool, RN, BSN, Kim on 03/17/2015 11:25:00 [name] ([id number]) -------------------------------------------------------------------------------- Problem List Details Patient Name: [name] Date of Service: 03/17/2015 11:00 AM Medical Record Number: [id number] Patient Account Number: [id number] Date of Birth/Sex: 02/28/1956 (59 y.o. 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 01/10/2015 Yes layer exposed L97.522 Non-pressure chronic ulcer of other part of left foot with fat 01/10/2015 Yes layer exposed [id number] Chronic systolic (congestive) heart failure 01/10/2015 Yes E11.42 Type 2 diabetes mellitus with diabetic polyneuropathy 01/10/2015 Yes S00.33XA Contusion of nose, initial encounter 01/17/2015 Yes 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, left ankle and foot 02/13/2015 Yes 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, stage 3 03/03/2015 Yes L89.323 Pressure ulcer of left buttock, stage 3 03/03/2015 Yes L97.512 Non-pressure chronic ulcer of other part of right foot with 03/10/2015 Yes fat layer exposed [name] ([id number]) Inactive Problems Resolved Problems Electronic Signature(s) Signed: 03/17/2015 12:20:52 PM By: Christin Fudge MD, FACS Entered By: Christin Fudge on 03/17/2015 11:20:38 [name] ([id number]) -------------------------------------------------------------------------------- Progress Note Details Patient Name: [name] Date of Service: 03/17/2015 11:00 AM Medical Record Number: [id number] Patient Account Number: [id number] Date of Birth/Sex: [birth year] (59 y.o. Female) Treating RN: Primary Care Physician: [name] Other Clinician: Referring Physician: [name] Treating Physician/Extender: Frann Rider in Treatment: 9 Subjective Chief Complaint Information obtained from Patient Patient presents to the wound care center for a consult due non healing wound 59 year old patient who comes with a history of an ulcer on the left second toe for at least about 4 months and a history on her left heel for about 3 weeks. I needed to get an MRI of her left foot but due to her defibrillator  this could not be done and hence she is getting get a triple phase bone scan. 01/17/2015 -- she has developed a significant injury to the bridge of the nose where her CPAP machine was fitting. This is a painfull dark spot. History of Present Illness (HPI) 59 year old patient who is known to have diabetes for several years and generally has it under good control last hemoglobin A1c was 6.7 done 2 weeks ago. She has several other comorbidities including acute CHF, hypoxia, hyperlipidemia, systolic heart failure, myocardial infarction, coronary artery disease, iron deficiency anemia, renal insufficiency, essential hypertension, morbid obesity and obstructive sleep apnea. She also has a  [name], [name] ([id number]) Visit Report for 03/17/2015 Chief Complaint Document Details Patient Name: [name], [name] Date of Service: 03/17/2015 11:00 AM Medical Record Number: [id number] Patient Account Number: [id number] Date of Birth/Sex: 1955-11-16 (59 y.o. Female) Treating RN: Primary Care Physician: [name] Other Clinician: Referring Physician: [name] Treating Physician/Extender: Frann Rider in Treatment: 9 Information Obtained from: Patient Chief Complaint Patient presents to the wound care center for a consult due non healing wound 59 year old patient who comes with a history of an ulcer on the left second toe for at least about 4 months and a history on her left heel for about 3 weeks. I needed to get an MRI of her left foot but due to her defibrillator this could not be done and hence she is getting get a triple phase bone scan. 01/17/2015 -- she has developed a significant injury to the bridge of the nose where her CPAP machine was fitting. This is a painfull dark spot. Electronic Signature(s) Signed: 03/17/2015 12:20:52 PM By: Christin Fudge MD, FACS Entered By: Christin Fudge on 03/17/2015 11:21:19 [name] ([id number]) -------------------------------------------------------------------------------- Debridement Details Patient Name: [name] Date of Service: 03/17/2015 11:00 AM Medical Record Number: [id number] Patient Account Number: [id number] Date of Birth/Sex: 1956/07/20 (59 y.o. Female) Treating RN: Primary Care Physician: [name] Other Clinician: Referring Physician: [name] Treating Physician/Extender: Frann Rider in Treatment: 9 Debridement Performed for Wound #1 Left Calcaneous Assessment: Performed By: Physician Pat Patrick., MD Debridement: Debridement Pre-procedure Yes Verification/Time Out Taken: Start Time: 11:10 Pain Control: Other : lidocaine 4% Level: Skin/Subcutaneous  Tissue Total Area Debrided (L x 1 (cm) x 1 (cm) = 1 (cm) W): Tissue and other Viable, Non-Viable, Eschar, Exudate, Fibrin/Slough, Subcutaneous material debrided: Instrument: Curette Bleeding: Minimum Hemostasis Achieved: Pressure End Time: 11:13 Procedural Pain: 0 Post Procedural Pain: 0 Response to Treatment: Procedure was tolerated well Post Debridement Measurements of Total Wound Length: (cm) 1 Width: (cm) 1 Depth: (cm) 0.4 Volume: (cm) 0.314 Electronic Signature(s) Signed: 03/17/2015 12:20:52 PM By: Christin Fudge MD, FACS Entered By: Christin Fudge on 03/17/2015 11:20:53 [name] ([id number]) -------------------------------------------------------------------------------- Debridement Details Patient Name: [name] Date of Service: 03/17/2015 11:00 AM Medical Record Number: [id number] Patient Account Number: [id number] Date of Birth/Sex: 03-19-1956 (59 y.o. Female) Treating RN: Primary Care Physician: [name] Other Clinician: Referring Physician: [name] Treating Physician/Extender: Frann Rider in Treatment: 9 Debridement Performed for Wound #7 Right Toe Second Assessment: Performed By: Physician Pat Patrick., MD Debridement: Debridement Pre-procedure Yes Verification/Time Out Taken: Start Time: 11:10 Pain Control: Other : lidocaine 4% Level: Skin/Subcutaneous Tissue Total Area Debrided (L x 1 (cm) x 1.5 (cm) = 1.5 (cm) W): Tissue and other Viable, Non-Viable, Exudate, Fibrin/Slough, Subcutaneous material debrided: Instrument: Curette Bleeding: Minimum Hemostasis Achieved: Pressure End Time: 11:13 Procedural Pain: 0 Post Procedural Pain: 0 Response to Treatment: Procedure was tolerated well Post Debridement Measurements of Total Wound Length: (cm) 1 Width: (cm) 1.5 Depth: (cm) 0.2 Volume: (cm) 0.236 Electronic Signature(s) Signed: 03/17/2015 12:20:52 PM By: Christin Fudge MD, FACS Entered By: Christin Fudge  on 03/17/2015 11:21:05 [name] ([id number]) -------------------------------------------------------------------------------- HPI Details Patient Name: [name] Date of Service: 03/17/2015 11:00 AM Medical Record Number: [id number] Patient Account Number: [id number] Date of Birth/Sex: 1956/03/08 (59 y.o. Female) Treating RN: Primary Care Physician: [name] Other Clinician: Referring Physician: [name] Treating Physician/Extender: Frann Rider in Treatment: 9 History of Present Illness HPI Description: 59 year old patient who is known to  [name], [name] ([id number]) Visit Report for 03/17/2015 Chief Complaint Document Details Patient Name: [name], [name] Date of Service: 03/17/2015 11:00 AM Medical Record Number: [id number] Patient Account Number: [id number] Date of Birth/Sex: 1955-11-16 (59 y.o. Female) Treating RN: Primary Care Physician: [name] Other Clinician: Referring Physician: [name] Treating Physician/Extender: Frann Rider in Treatment: 9 Information Obtained from: Patient Chief Complaint Patient presents to the wound care center for a consult due non healing wound 59 year old patient who comes with a history of an ulcer on the left second toe for at least about 4 months and a history on her left heel for about 3 weeks. I needed to get an MRI of her left foot but due to her defibrillator this could not be done and hence she is getting get a triple phase bone scan. 01/17/2015 -- she has developed a significant injury to the bridge of the nose where her CPAP machine was fitting. This is a painfull dark spot. Electronic Signature(s) Signed: 03/17/2015 12:20:52 PM By: Christin Fudge MD, FACS Entered By: Christin Fudge on 03/17/2015 11:21:19 [name] ([id number]) -------------------------------------------------------------------------------- Debridement Details Patient Name: [name] Date of Service: 03/17/2015 11:00 AM Medical Record Number: [id number] Patient Account Number: [id number] Date of Birth/Sex: 1956/07/20 (59 y.o. Female) Treating RN: Primary Care Physician: [name] Other Clinician: Referring Physician: [name] Treating Physician/Extender: Frann Rider in Treatment: 9 Debridement Performed for Wound #1 Left Calcaneous Assessment: Performed By: Physician Pat Patrick., MD Debridement: Debridement Pre-procedure Yes Verification/Time Out Taken: Start Time: 11:10 Pain Control: Other : lidocaine 4% Level: Skin/Subcutaneous  Tissue Total Area Debrided (L x 1 (cm) x 1 (cm) = 1 (cm) W): Tissue and other Viable, Non-Viable, Eschar, Exudate, Fibrin/Slough, Subcutaneous material debrided: Instrument: Curette Bleeding: Minimum Hemostasis Achieved: Pressure End Time: 11:13 Procedural Pain: 0 Post Procedural Pain: 0 Response to Treatment: Procedure was tolerated well Post Debridement Measurements of Total Wound Length: (cm) 1 Width: (cm) 1 Depth: (cm) 0.4 Volume: (cm) 0.314 Electronic Signature(s) Signed: 03/17/2015 12:20:52 PM By: Christin Fudge MD, FACS Entered By: Christin Fudge on 03/17/2015 11:20:53 [name] ([id number]) -------------------------------------------------------------------------------- Debridement Details Patient Name: [name] Date of Service: 03/17/2015 11:00 AM Medical Record Number: [id number] Patient Account Number: [id number] Date of Birth/Sex: 03-19-1956 (59 y.o. Female) Treating RN: Primary Care Physician: [name] Other Clinician: Referring Physician: [name] Treating Physician/Extender: Frann Rider in Treatment: 9 Debridement Performed for Wound #7 Right Toe Second Assessment: Performed By: Physician Pat Patrick., MD Debridement: Debridement Pre-procedure Yes Verification/Time Out Taken: Start Time: 11:10 Pain Control: Other : lidocaine 4% Level: Skin/Subcutaneous Tissue Total Area Debrided (L x 1 (cm) x 1.5 (cm) = 1.5 (cm) W): Tissue and other Viable, Non-Viable, Exudate, Fibrin/Slough, Subcutaneous material debrided: Instrument: Curette Bleeding: Minimum Hemostasis Achieved: Pressure End Time: 11:13 Procedural Pain: 0 Post Procedural Pain: 0 Response to Treatment: Procedure was tolerated well Post Debridement Measurements of Total Wound Length: (cm) 1 Width: (cm) 1.5 Depth: (cm) 0.2 Volume: (cm) 0.236 Electronic Signature(s) Signed: 03/17/2015 12:20:52 PM By: Christin Fudge MD, FACS Entered By: Christin Fudge  on 03/17/2015 11:21:05 [name] ([id number]) -------------------------------------------------------------------------------- HPI Details Patient Name: [name] Date of Service: 03/17/2015 11:00 AM Medical Record Number: [id number] Patient Account Number: [id number] Date of Birth/Sex: 1956/03/08 (59 y.o. Female) Treating RN: Primary Care Physician: [name] Other Clinician: Referring Physician: [name] Treating Physician/Extender: Frann Rider in Treatment: 9 History of Present Illness HPI Description: 59 year old patient who is known to  [id number]) A. Give patient an 8 ounce Glucerna Shake, an 8 ounce Ensure, or 8 ounces of a Glucerna/Ensure equivalent dietary supplement*. B. Wait 15 minutes for symptoms of hypoglycemia (i.e. nervousness, anxiety, sweating, chills, clamminess, irritability, confusion, tachycardia or dizziness). C. If patient asymptomatic, discharge patient. If patient symptomatic, repeat capillary blood glucose (CBG) and notify HBO physician. If result is 101 mg/dl to 249 mg/dl: A. Discharge patient. A. Notify HBO physician and await physician orders. B. It is recommended to do If result is 250 mg/dl or greater: blood/urine ketone testing. C. If the result meets the hospital definition of a critical result, follow hospital policy. *Juice or candies are NOT equivalent products. If patient refuses the Glucerna or Ensure, please consult the hospital dietitian for an appropriate substitute. Electronic Signature(s) Signed: 03/17/2015 12:20:52 PM By: Christin Fudge MD, FACS Signed: 03/17/2015 4:53:24 PM By: Gretta Cool RN, BSN, Kim RN, BSN Entered By: Gretta Cool, RN, BSN, Kim on 03/17/2015 11:25:00 [name] ([id number]) -------------------------------------------------------------------------------- Problem List Details Patient Name: [name] Date of Service: 03/17/2015 11:00 AM Medical Record Number: [id number] Patient Account Number: [id number] Date of Birth/Sex: 02/28/1956 (59 y.o. 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 01/10/2015 Yes layer exposed L97.522 Non-pressure chronic ulcer of other part of left foot with fat 01/10/2015 Yes layer exposed [id number] Chronic systolic (congestive) heart failure 01/10/2015 Yes E11.42 Type 2 diabetes mellitus with diabetic polyneuropathy 01/10/2015 Yes S00.33XA Contusion of nose, initial encounter 01/17/2015 Yes 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, left ankle and foot 02/13/2015 Yes 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, stage 3 03/03/2015 Yes L89.323 Pressure ulcer of left buttock, stage 3 03/03/2015 Yes L97.512 Non-pressure chronic ulcer of other part of right foot with 03/10/2015 Yes fat layer exposed [name] ([id number]) Inactive Problems Resolved Problems Electronic Signature(s) Signed: 03/17/2015 12:20:52 PM By: Christin Fudge MD, FACS Entered By: Christin Fudge on 03/17/2015 11:20:38 [name] ([id number]) -------------------------------------------------------------------------------- Progress Note Details Patient Name: [name] Date of Service: 03/17/2015 11:00 AM Medical Record Number: [id number] Patient Account Number: [id number] Date of Birth/Sex: [birth year] (59 y.o. Female) Treating RN: Primary Care Physician: [name] Other Clinician: Referring Physician: [name] Treating Physician/Extender: Frann Rider in Treatment: 9 Subjective Chief Complaint Information obtained from Patient Patient presents to the wound care center for a consult due non healing wound 59 year old patient who comes with a history of an ulcer on the left second toe for at least about 4 months and a history on her left heel for about 3 weeks. I needed to get an MRI of her left foot but due to her defibrillator  this could not be done and hence she is getting get a triple phase bone scan. 01/17/2015 -- she has developed a significant injury to the bridge of the nose where her CPAP machine was fitting. This is a painfull dark spot. History of Present Illness (HPI) 59 year old patient who is known to have diabetes for several years and generally has it under good control last hemoglobin A1c was 6.7 done 2 weeks ago. She has several other comorbidities including acute CHF, hypoxia, hyperlipidemia, systolic heart failure, myocardial infarction, coronary artery disease, iron deficiency anemia, renal insufficiency, essential hypertension, morbid obesity and obstructive sleep apnea. She also has a  [name], [name] ([id number]) Visit Report for 03/17/2015 Chief Complaint Document Details Patient Name: [name], [name] Date of Service: 03/17/2015 11:00 AM Medical Record Number: [id number] Patient Account Number: [id number] Date of Birth/Sex: 1955-11-16 (59 y.o. Female) Treating RN: Primary Care Physician: [name] Other Clinician: Referring Physician: [name] Treating Physician/Extender: Frann Rider in Treatment: 9 Information Obtained from: Patient Chief Complaint Patient presents to the wound care center for a consult due non healing wound 59 year old patient who comes with a history of an ulcer on the left second toe for at least about 4 months and a history on her left heel for about 3 weeks. I needed to get an MRI of her left foot but due to her defibrillator this could not be done and hence she is getting get a triple phase bone scan. 01/17/2015 -- she has developed a significant injury to the bridge of the nose where her CPAP machine was fitting. This is a painfull dark spot. Electronic Signature(s) Signed: 03/17/2015 12:20:52 PM By: Christin Fudge MD, FACS Entered By: Christin Fudge on 03/17/2015 11:21:19 [name] ([id number]) -------------------------------------------------------------------------------- Debridement Details Patient Name: [name] Date of Service: 03/17/2015 11:00 AM Medical Record Number: [id number] Patient Account Number: [id number] Date of Birth/Sex: 1956/07/20 (59 y.o. Female) Treating RN: Primary Care Physician: [name] Other Clinician: Referring Physician: [name] Treating Physician/Extender: Frann Rider in Treatment: 9 Debridement Performed for Wound #1 Left Calcaneous Assessment: Performed By: Physician Pat Patrick., MD Debridement: Debridement Pre-procedure Yes Verification/Time Out Taken: Start Time: 11:10 Pain Control: Other : lidocaine 4% Level: Skin/Subcutaneous  Tissue Total Area Debrided (L x 1 (cm) x 1 (cm) = 1 (cm) W): Tissue and other Viable, Non-Viable, Eschar, Exudate, Fibrin/Slough, Subcutaneous material debrided: Instrument: Curette Bleeding: Minimum Hemostasis Achieved: Pressure End Time: 11:13 Procedural Pain: 0 Post Procedural Pain: 0 Response to Treatment: Procedure was tolerated well Post Debridement Measurements of Total Wound Length: (cm) 1 Width: (cm) 1 Depth: (cm) 0.4 Volume: (cm) 0.314 Electronic Signature(s) Signed: 03/17/2015 12:20:52 PM By: Christin Fudge MD, FACS Entered By: Christin Fudge on 03/17/2015 11:20:53 [name] ([id number]) -------------------------------------------------------------------------------- Debridement Details Patient Name: [name] Date of Service: 03/17/2015 11:00 AM Medical Record Number: [id number] Patient Account Number: [id number] Date of Birth/Sex: 03-19-1956 (59 y.o. Female) Treating RN: Primary Care Physician: [name] Other Clinician: Referring Physician: [name] Treating Physician/Extender: Frann Rider in Treatment: 9 Debridement Performed for Wound #7 Right Toe Second Assessment: Performed By: Physician Pat Patrick., MD Debridement: Debridement Pre-procedure Yes Verification/Time Out Taken: Start Time: 11:10 Pain Control: Other : lidocaine 4% Level: Skin/Subcutaneous Tissue Total Area Debrided (L x 1 (cm) x 1.5 (cm) = 1.5 (cm) W): Tissue and other Viable, Non-Viable, Exudate, Fibrin/Slough, Subcutaneous material debrided: Instrument: Curette Bleeding: Minimum Hemostasis Achieved: Pressure End Time: 11:13 Procedural Pain: 0 Post Procedural Pain: 0 Response to Treatment: Procedure was tolerated well Post Debridement Measurements of Total Wound Length: (cm) 1 Width: (cm) 1.5 Depth: (cm) 0.2 Volume: (cm) 0.236 Electronic Signature(s) Signed: 03/17/2015 12:20:52 PM By: Christin Fudge MD, FACS Entered By: Christin Fudge  on 03/17/2015 11:21:05 [name] ([id number]) -------------------------------------------------------------------------------- HPI Details Patient Name: [name] Date of Service: 03/17/2015 11:00 AM Medical Record Number: [id number] Patient Account Number: [id number] Date of Birth/Sex: 1956/03/08 (59 y.o. Female) Treating RN: Primary Care Physician: [name] Other Clinician: Referring Physician: [name] Treating Physician/Extender: Frann Rider in Treatment: 9 History of Present Illness HPI Description: 59 year old patient who is known to  [id number]) A. Give patient an 8 ounce Glucerna Shake, an 8 ounce Ensure, or 8 ounces of a Glucerna/Ensure equivalent dietary supplement*. B. Wait 15 minutes for symptoms of hypoglycemia (i.e. nervousness, anxiety, sweating, chills, clamminess, irritability, confusion, tachycardia or dizziness). C. If patient asymptomatic, discharge patient. If patient symptomatic, repeat capillary blood glucose (CBG) and notify HBO physician. If result is 101 mg/dl to 249 mg/dl: A. Discharge patient. A. Notify HBO physician and await physician orders. B. It is recommended to do If result is 250 mg/dl or greater: blood/urine ketone testing. C. If the result meets the hospital definition of a critical result, follow hospital policy. *Juice or candies are NOT equivalent products. If patient refuses the Glucerna or Ensure, please consult the hospital dietitian for an appropriate substitute. Electronic Signature(s) Signed: 03/17/2015 12:20:52 PM By: Christin Fudge MD, FACS Signed: 03/17/2015 4:53:24 PM By: Gretta Cool RN, BSN, Kim RN, BSN Entered By: Gretta Cool, RN, BSN, Kim on 03/17/2015 11:25:00 [name] ([id number]) -------------------------------------------------------------------------------- Problem List Details Patient Name: [name] Date of Service: 03/17/2015 11:00 AM Medical Record Number: [id number] Patient Account Number: [id number] Date of Birth/Sex: 02/28/1956 (59 y.o. 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 01/10/2015 Yes layer exposed L97.522 Non-pressure chronic ulcer of other part of left foot with fat 01/10/2015 Yes layer exposed [id number] Chronic systolic (congestive) heart failure 01/10/2015 Yes E11.42 Type 2 diabetes mellitus with diabetic polyneuropathy 01/10/2015 Yes S00.33XA Contusion of nose, initial encounter 01/17/2015 Yes 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, left ankle and foot 02/13/2015 Yes 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, stage 3 03/03/2015 Yes L89.323 Pressure ulcer of left buttock, stage 3 03/03/2015 Yes L97.512 Non-pressure chronic ulcer of other part of right foot with 03/10/2015 Yes fat layer exposed [name] ([id number]) Inactive Problems Resolved Problems Electronic Signature(s) Signed: 03/17/2015 12:20:52 PM By: Christin Fudge MD, FACS Entered By: Christin Fudge on 03/17/2015 11:20:38 [name] ([id number]) -------------------------------------------------------------------------------- Progress Note Details Patient Name: [name] Date of Service: 03/17/2015 11:00 AM Medical Record Number: [id number] Patient Account Number: [id number] Date of Birth/Sex: [birth year] (59 y.o. Female) Treating RN: Primary Care Physician: [name] Other Clinician: Referring Physician: [name] Treating Physician/Extender: Frann Rider in Treatment: 9 Subjective Chief Complaint Information obtained from Patient Patient presents to the wound care center for a consult due non healing wound 59 year old patient who comes with a history of an ulcer on the left second toe for at least about 4 months and a history on her left heel for about 3 weeks. I needed to get an MRI of her left foot but due to her defibrillator  this could not be done and hence she is getting get a triple phase bone scan. 01/17/2015 -- she has developed a significant injury to the bridge of the nose where her CPAP machine was fitting. This is a painfull dark spot. History of Present Illness (HPI) 59 year old patient who is known to have diabetes for several years and generally has it under good control last hemoglobin A1c was 6.7 done 2 weeks ago. She has several other comorbidities including acute CHF, hypoxia, hyperlipidemia, systolic heart failure, myocardial infarction, coronary artery disease, iron deficiency anemia, renal insufficiency, essential hypertension, morbid obesity and obstructive sleep apnea. She also has a  [name], [name] ([id number]) Visit Report for 03/17/2015 Chief Complaint Document Details Patient Name: [name], [name] Date of Service: 03/17/2015 11:00 AM Medical Record Number: [id number] Patient Account Number: [id number] Date of Birth/Sex: 1955-11-16 (59 y.o. Female) Treating RN: Primary Care Physician: [name] Other Clinician: Referring Physician: [name] Treating Physician/Extender: Frann Rider in Treatment: 9 Information Obtained from: Patient Chief Complaint Patient presents to the wound care center for a consult due non healing wound 59 year old patient who comes with a history of an ulcer on the left second toe for at least about 4 months and a history on her left heel for about 3 weeks. I needed to get an MRI of her left foot but due to her defibrillator this could not be done and hence she is getting get a triple phase bone scan. 01/17/2015 -- she has developed a significant injury to the bridge of the nose where her CPAP machine was fitting. This is a painfull dark spot. Electronic Signature(s) Signed: 03/17/2015 12:20:52 PM By: Christin Fudge MD, FACS Entered By: Christin Fudge on 03/17/2015 11:21:19 [name] ([id number]) -------------------------------------------------------------------------------- Debridement Details Patient Name: [name] Date of Service: 03/17/2015 11:00 AM Medical Record Number: [id number] Patient Account Number: [id number] Date of Birth/Sex: 1956/07/20 (59 y.o. Female) Treating RN: Primary Care Physician: [name] Other Clinician: Referring Physician: [name] Treating Physician/Extender: Frann Rider in Treatment: 9 Debridement Performed for Wound #1 Left Calcaneous Assessment: Performed By: Physician Pat Patrick., MD Debridement: Debridement Pre-procedure Yes Verification/Time Out Taken: Start Time: 11:10 Pain Control: Other : lidocaine 4% Level: Skin/Subcutaneous  Tissue Total Area Debrided (L x 1 (cm) x 1 (cm) = 1 (cm) W): Tissue and other Viable, Non-Viable, Eschar, Exudate, Fibrin/Slough, Subcutaneous material debrided: Instrument: Curette Bleeding: Minimum Hemostasis Achieved: Pressure End Time: 11:13 Procedural Pain: 0 Post Procedural Pain: 0 Response to Treatment: Procedure was tolerated well Post Debridement Measurements of Total Wound Length: (cm) 1 Width: (cm) 1 Depth: (cm) 0.4 Volume: (cm) 0.314 Electronic Signature(s) Signed: 03/17/2015 12:20:52 PM By: Christin Fudge MD, FACS Entered By: Christin Fudge on 03/17/2015 11:20:53 [name] ([id number]) -------------------------------------------------------------------------------- Debridement Details Patient Name: [name] Date of Service: 03/17/2015 11:00 AM Medical Record Number: [id number] Patient Account Number: [id number] Date of Birth/Sex: 03-19-1956 (59 y.o. Female) Treating RN: Primary Care Physician: [name] Other Clinician: Referring Physician: [name] Treating Physician/Extender: Frann Rider in Treatment: 9 Debridement Performed for Wound #7 Right Toe Second Assessment: Performed By: Physician Pat Patrick., MD Debridement: Debridement Pre-procedure Yes Verification/Time Out Taken: Start Time: 11:10 Pain Control: Other : lidocaine 4% Level: Skin/Subcutaneous Tissue Total Area Debrided (L x 1 (cm) x 1.5 (cm) = 1.5 (cm) W): Tissue and other Viable, Non-Viable, Exudate, Fibrin/Slough, Subcutaneous material debrided: Instrument: Curette Bleeding: Minimum Hemostasis Achieved: Pressure End Time: 11:13 Procedural Pain: 0 Post Procedural Pain: 0 Response to Treatment: Procedure was tolerated well Post Debridement Measurements of Total Wound Length: (cm) 1 Width: (cm) 1.5 Depth: (cm) 0.2 Volume: (cm) 0.236 Electronic Signature(s) Signed: 03/17/2015 12:20:52 PM By: Christin Fudge MD, FACS Entered By: Christin Fudge  on 03/17/2015 11:21:05 [name] ([id number]) -------------------------------------------------------------------------------- HPI Details Patient Name: [name] Date of Service: 03/17/2015 11:00 AM Medical Record Number: [id number] Patient Account Number: [id number] Date of Birth/Sex: 1956/03/08 (59 y.o. Female) Treating RN: Primary Care Physician: [name] Other Clinician: Referring Physician: [name] Treating Physician/Extender: Frann Rider in Treatment: 9 History of Present Illness HPI Description: 59 year old patient who is known to  [name], [name] ([id number]) Visit Report for 03/17/2015 Chief Complaint Document Details Patient Name: [name], [name] Date of Service: 03/17/2015 11:00 AM Medical Record Number: [id number] Patient Account Number: [id number] Date of Birth/Sex: 1955-11-16 (59 y.o. Female) Treating RN: Primary Care Physician: [name] Other Clinician: Referring Physician: [name] Treating Physician/Extender: Frann Rider in Treatment: 9 Information Obtained from: Patient Chief Complaint Patient presents to the wound care center for a consult due non healing wound 59 year old patient who comes with a history of an ulcer on the left second toe for at least about 4 months and a history on her left heel for about 3 weeks. I needed to get an MRI of her left foot but due to her defibrillator this could not be done and hence she is getting get a triple phase bone scan. 01/17/2015 -- she has developed a significant injury to the bridge of the nose where her CPAP machine was fitting. This is a painfull dark spot. Electronic Signature(s) Signed: 03/17/2015 12:20:52 PM By: Christin Fudge MD, FACS Entered By: Christin Fudge on 03/17/2015 11:21:19 [name] ([id number]) -------------------------------------------------------------------------------- Debridement Details Patient Name: [name] Date of Service: 03/17/2015 11:00 AM Medical Record Number: [id number] Patient Account Number: [id number] Date of Birth/Sex: 1956/07/20 (59 y.o. Female) Treating RN: Primary Care Physician: [name] Other Clinician: Referring Physician: [name] Treating Physician/Extender: Frann Rider in Treatment: 9 Debridement Performed for Wound #1 Left Calcaneous Assessment: Performed By: Physician Pat Patrick., MD Debridement: Debridement Pre-procedure Yes Verification/Time Out Taken: Start Time: 11:10 Pain Control: Other : lidocaine 4% Level: Skin/Subcutaneous  Tissue Total Area Debrided (L x 1 (cm) x 1 (cm) = 1 (cm) W): Tissue and other Viable, Non-Viable, Eschar, Exudate, Fibrin/Slough, Subcutaneous material debrided: Instrument: Curette Bleeding: Minimum Hemostasis Achieved: Pressure End Time: 11:13 Procedural Pain: 0 Post Procedural Pain: 0 Response to Treatment: Procedure was tolerated well Post Debridement Measurements of Total Wound Length: (cm) 1 Width: (cm) 1 Depth: (cm) 0.4 Volume: (cm) 0.314 Electronic Signature(s) Signed: 03/17/2015 12:20:52 PM By: Christin Fudge MD, FACS Entered By: Christin Fudge on 03/17/2015 11:20:53 [name] ([id number]) -------------------------------------------------------------------------------- Debridement Details Patient Name: [name] Date of Service: 03/17/2015 11:00 AM Medical Record Number: [id number] Patient Account Number: [id number] Date of Birth/Sex: 03-19-1956 (59 y.o. Female) Treating RN: Primary Care Physician: [name] Other Clinician: Referring Physician: [name] Treating Physician/Extender: Frann Rider in Treatment: 9 Debridement Performed for Wound #7 Right Toe Second Assessment: Performed By: Physician Pat Patrick., MD Debridement: Debridement Pre-procedure Yes Verification/Time Out Taken: Start Time: 11:10 Pain Control: Other : lidocaine 4% Level: Skin/Subcutaneous Tissue Total Area Debrided (L x 1 (cm) x 1.5 (cm) = 1.5 (cm) W): Tissue and other Viable, Non-Viable, Exudate, Fibrin/Slough, Subcutaneous material debrided: Instrument: Curette Bleeding: Minimum Hemostasis Achieved: Pressure End Time: 11:13 Procedural Pain: 0 Post Procedural Pain: 0 Response to Treatment: Procedure was tolerated well Post Debridement Measurements of Total Wound Length: (cm) 1 Width: (cm) 1.5 Depth: (cm) 0.2 Volume: (cm) 0.236 Electronic Signature(s) Signed: 03/17/2015 12:20:52 PM By: Christin Fudge MD, FACS Entered By: Christin Fudge  on 03/17/2015 11:21:05 [name] ([id number]) -------------------------------------------------------------------------------- HPI Details Patient Name: [name] Date of Service: 03/17/2015 11:00 AM Medical Record Number: [id number] Patient Account Number: [id number] Date of Birth/Sex: 1956/03/08 (59 y.o. Female) Treating RN: Primary Care Physician: [name] Other Clinician: Referring Physician: [name] Treating Physician/Extender: Frann Rider in Treatment: 9 History of Present Illness HPI Description: 59 year old patient who is known to  [id number]) A. Give patient an 8 ounce Glucerna Shake, an 8 ounce Ensure, or 8 ounces of a Glucerna/Ensure equivalent dietary supplement*. B. Wait 15 minutes for symptoms of hypoglycemia (i.e. nervousness, anxiety, sweating, chills, clamminess, irritability, confusion, tachycardia or dizziness). C. If patient asymptomatic, discharge patient. If patient symptomatic, repeat capillary blood glucose (CBG) and notify HBO physician. If result is 101 mg/dl to 249 mg/dl: A. Discharge patient. A. Notify HBO physician and await physician orders. B. It is recommended to do If result is 250 mg/dl or greater: blood/urine ketone testing. C. If the result meets the hospital definition of a critical result, follow hospital policy. *Juice or candies are NOT equivalent products. If patient refuses the Glucerna or Ensure, please consult the hospital dietitian for an appropriate substitute. Electronic Signature(s) Signed: 03/17/2015 12:20:52 PM By: Christin Fudge MD, FACS Signed: 03/17/2015 4:53:24 PM By: Gretta Cool RN, BSN, Kim RN, BSN Entered By: Gretta Cool, RN, BSN, Kim on 03/17/2015 11:25:00 [name] ([id number]) -------------------------------------------------------------------------------- Problem List Details Patient Name: [name] Date of Service: 03/17/2015 11:00 AM Medical Record Number: [id number] Patient Account Number: [id number] Date of Birth/Sex: 02/28/1956 (59 y.o. 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 01/10/2015 Yes layer exposed L97.522 Non-pressure chronic ulcer of other part of left foot with fat 01/10/2015 Yes layer exposed [id number] Chronic systolic (congestive) heart failure 01/10/2015 Yes E11.42 Type 2 diabetes mellitus with diabetic polyneuropathy 01/10/2015 Yes S00.33XA Contusion of nose, initial encounter 01/17/2015 Yes 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, left ankle and foot 02/13/2015 Yes 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, stage 3 03/03/2015 Yes L89.323 Pressure ulcer of left buttock, stage 3 03/03/2015 Yes L97.512 Non-pressure chronic ulcer of other part of right foot with 03/10/2015 Yes fat layer exposed [name] ([id number]) Inactive Problems Resolved Problems Electronic Signature(s) Signed: 03/17/2015 12:20:52 PM By: Christin Fudge MD, FACS Entered By: Christin Fudge on 03/17/2015 11:20:38 [name] ([id number]) -------------------------------------------------------------------------------- Progress Note Details Patient Name: [name] Date of Service: 03/17/2015 11:00 AM Medical Record Number: [id number] Patient Account Number: [id number] Date of Birth/Sex: [birth year] (59 y.o. Female) Treating RN: Primary Care Physician: [name] Other Clinician: Referring Physician: [name] Treating Physician/Extender: Frann Rider in Treatment: 9 Subjective Chief Complaint Information obtained from Patient Patient presents to the wound care center for a consult due non healing wound 59 year old patient who comes with a history of an ulcer on the left second toe for at least about 4 months and a history on her left heel for about 3 weeks. I needed to get an MRI of her left foot but due to her defibrillator  this could not be done and hence she is getting get a triple phase bone scan. 01/17/2015 -- she has developed a significant injury to the bridge of the nose where her CPAP machine was fitting. This is a painfull dark spot. History of Present Illness (HPI) 59 year old patient who is known to have diabetes for several years and generally has it under good control last hemoglobin A1c was 6.7 done 2 weeks ago. She has several other comorbidities including acute CHF, hypoxia, hyperlipidemia, systolic heart failure, myocardial infarction, coronary artery disease, iron deficiency anemia, renal insufficiency, essential hypertension, morbid obesity and obstructive sleep apnea. She also has a

## 2015-03-18 NOTE — Progress Notes (Signed)
No Induration: No Induration: No Callus: No Callus: No Crepitus: No Crepitus: No Fluctuance: No Fluctuance: No Friable: No Friable: No Rash: No Rash: No Scarring: No Scarring: No Periwound Skin Moist: Yes Moist: Yes N/A Moisture: Maceration: No Maceration: No Dry/Scaly: No Dry/Scaly: No Periwound Skin Color: Atrophie Blanche: No Atrophie Blanche: No N/A Cyanosis: No Cyanosis: No Ecchymosis: No Ecchymosis: No Erythema: No Erythema:  No Hemosiderin Staining: No Hemosiderin Staining: No Mottled: No Mottled: No Pallor: No Pallor: No Rubor: No Rubor: No Temperature: No Abnormality No Abnormality N/A Tenderness on No No N/A Palpation: Wound Preparation: N/A KIERYN, JUSTO ([id number]) Ulcer Cleansing: Ulcer Cleansing: Rinsed/Irrigated with Rinsed/Irrigated with Saline Saline Topical Anesthetic Topical Anesthetic Applied: Other: lidoacine Applied: Other: lidocaine 4% 4% Treatment Notes Electronic Signature(s) Signed: 03/17/2015 4:53:24 PM By: Gretta Cool, RN, BSN, Kim RN, BSN Entered By: Gretta Cool, RN, BSN, Kim on 03/17/2015 11:10:36 [name] ([id number]) -------------------------------------------------------------------------------- [place] Details Patient Name: [name] Date of Service: 03/17/2015 11:00 AM Medical Record Number: [id number] Patient Account Number: [id number] Date of Birth/Sex: [birth year] (59 y.o. Female) Treating RN: Afful, RN, BSN, Velva Harman Primary Care Physician: [name] Other Clinician: Referring Physician: [name] Treating Physician/Extender: Frann Rider in Treatment: 9 Active Inactive Abuse / Safety / Falls / Self Care Management Nursing Diagnoses: Impaired physical mobility Potential for falls Self care deficit: actual or potential Goals: Patient/caregiver will verbalize understanding of skin care regimen Date Initiated: 01/10/2015 Goal Status: Active Patient/caregiver will verbalize/demonstrate measures taken to improve the patient's personal safety Date Initiated: 01/10/2015 Goal Status: Active Patient/caregiver will verbalize/demonstrate measures taken to prevent injury and/or falls Date Initiated: 01/10/2015 Goal Status: Active Patient/caregiver will verbalize/demonstrate understanding of what to do in case of emergency Date Initiated: 01/10/2015 Goal Status: Active Interventions: Assess fall risk on admission and as  needed Assess impairment of mobility on admission and as needed per policy Provide education on basic hygiene Provide education on fall prevention Provide education on personal and home safety Provide education on safe transfers Treatment Activities: Education provided on Basic Hygiene : 01/10/2015 Notes: Nutrition ANARELI, LOEWER ([id number]) Nursing Diagnoses: Impaired glucose control: actual or potential Potential for alteratiion in Nutrition/Potential for imbalanced nutrition Goals: Patient/caregiver agrees to and verbalizes understanding of need to use nutritional supplements and/or vitamins as prescribed Date Initiated: 01/10/2015 Goal Status: Active Interventions: Assess HgA1c results as ordered upon admission and as needed Treatment Activities: Patient referred to Primary Care Physician for further nutritional evaluation : 03/17/2015 Notes: Orientation to the Wound Care Program Nursing Diagnoses: Knowledge deficit related to the wound healing center program Goals: Patient/caregiver will verbalize understanding of the [facility] Date Initiated: 01/10/2015 Goal Status: Active Interventions: Provide education on orientation to the wound center Notes: Osteomyelitis Nursing Diagnoses: Potential for infection: osteomyelitis Goals: Diagnostic evaluation for osteomyelitis completed as ordered Date Initiated: 01/17/2015 Goal Status: Active Patient/caregiver will verbalize understanding of disease process and disease management Date Initiated: 01/17/2015 Goal Status: Active Signs and symptoms for osteomyelitis will be recognized and promptly addressed BRITTINIE, SARE ([id number]) Date Initiated: 01/17/2015 Goal Status: Active Interventions: Assess for signs and symptoms of osteomyelitis resolution every visit Provide education on osteomyelitis Treatment Activities: MRI : 03/17/2015 Test ordered outside of clinic : 03/17/2015 X-ray :  03/17/2015 Notes: Soft Tissue Infection Nursing Diagnoses: Knowledge deficit related to disease process and management Potential for infection: soft tissue Goals: Patient/caregiver will verbalize understanding of or measures to prevent infection and contamination in the home setting Date Initiated: 01/17/2015 Goal Status: Active  Exudate Type: Serosanguineous Exudate Color: red, brown Foul Odor After Cleansing: No Wound Bed Granulation Amount: Medium (34-66%) Exposed Structure Granulation Quality: Red Fascia Exposed: No Necrotic Amount: Small (1-33%) Fat Layer Exposed: No Necrotic Quality: Adherent Slough Tendon Exposed: No AMORY, MARCELLINO. ([id number]) Muscle Exposed: No Joint Exposed: No Bone Exposed: No Limited to Skin Breakdown Periwound Skin Texture Texture Color No Abnormalities Noted: No No Abnormalities Noted: No Callus: No Atrophie Blanche: No Crepitus: No Cyanosis: No Excoriation: No Ecchymosis: No Fluctuance: No Erythema: No Friable: No Hemosiderin Staining:  No Induration: No Mottled: No Localized Edema: No Pallor: No Rash: No Rubor: No Scarring: No Temperature / Pain Moisture Temperature: No Abnormality No Abnormalities Noted: No Tenderness on Palpation: Yes Dry / Scaly: No Maceration: No Moist: Yes Wound Preparation Ulcer Cleansing: Rinsed/Irrigated with Saline Topical Anesthetic Applied: Other: lidocaine 4%, Treatment Notes Wound #1 (Left Calcaneous) 1. Cleansed with: Clean wound with Normal Saline 2. Anesthetic Topical Lidocaine 4% cream to wound bed prior to debridement 4. Dressing Applied: Prisma Ag 5. Secondary Dressing Applied Bordered Foam Dressing Kerlix/Conform Gauze and Kerlix/Conform Notes boarded foam to the sacram and left upper thigh Electronic Signature(s) Signed: 03/17/2015 1:37:20 PM By: Regan Lemming BSN, RN Entered By: Regan Lemming on 03/17/2015 11:02:41 [name] ([id number]) ZACORIA, REZNICEK ([id number]) -------------------------------------------------------------------------------- Wound Assessment Details Patient Name: [name] Date of Service: 03/17/2015 11:00 AM Medical Record Number: [id number] Patient Account Number: [id number] Date of Birth/Sex: May 18, 1956 (59 y.o. Female) Treating RN: Afful, RN, BSN, [place] Primary Care Physician: [name] Other Clinician: Referring Physician: [name] Treating Physician/Extender: Frann Rider in Treatment: 9 Wound Status Wound Number: 3 Primary Pressure Ulcer Etiology: Wound Location: Nose Wound Open Wounding Event: Pressure Injury Status: Date Acquired: 11/07/2014 Comorbid Anemia, Arrhythmia, Congestive Heart Weeks Of Treatment: 8 History: Failure, Hypertension, Type II Diabetes Clustered Wound: No Photos Photo Uploaded By: Regan Lemming on 03/17/2015 13:31:09 Wound Measurements Length: (cm) 1.1 Width: (cm) 1 Depth: (cm) 0.1 Area: (cm) 0.864 Volume: (cm) 0.086 % Reduction in Area: -57.1% % Reduction in  Volume: 21.8% Epithelialization: Small (1-33%) Tunneling: No Wound Description Classification: Category/Stage III Wound Margin: Indistinct, nonvisible Exudate Amount: None Present Foul Odor After Cleansing: No Wound Bed Granulation Amount: None Present (0%) Exposed Structure Necrotic Amount: Large (67-100%) Fascia Exposed: No Necrotic Quality: Eschar Fat Layer Exposed: No Tendon Exposed: No Muscle Exposed: No Joint Exposed: No AMAIRAH, KOELLNER. ([id number]) Bone Exposed: No Limited to Skin Breakdown Periwound Skin Texture Texture Color No Abnormalities Noted: No No Abnormalities Noted: No Callus: No Atrophie Blanche: No Crepitus: No Cyanosis: No Excoriation: No Ecchymosis: No Fluctuance: No Erythema: No Friable: No Hemosiderin Staining: No Induration: No Mottled: No Localized Edema: No Pallor: No Rash: No Rubor: No Scarring: No Temperature / Pain Moisture Temperature: No Abnormality No Abnormalities Noted: No Dry / Scaly: Yes Maceration: No Moist: No Wound Preparation Ulcer Cleansing: Rinsed/Irrigated with Saline Topical Anesthetic Applied: Other: lidocaine 4%, Treatment Notes Wound #3 (Nose) 1. Cleansed with: Clean wound with Normal Saline 4. Dressing Applied: Other dressing (specify in notes) Notes betadine paint Electronic Signature(s) Signed: 03/17/2015 1:37:20 PM By: Regan Lemming BSN, RN Entered By: Regan Lemming on 03/17/2015 11:01:45 [name] ([id number]) -------------------------------------------------------------------------------- Wound Assessment Details Patient Name: [name] Date of Service: 03/17/2015 11:00 AM Medical Record Number: [id number] Patient Account Number: [id number] Date of Birth/Sex: [birth year] (59 y.o. Female) Treating RN: Baruch Gouty, RN, BSN, Velva Harman Primary Care Physician: [name] Other Clinician: Referring Physician: [name] Treating Physician/Extender: Christin Fudge  Weeks in Treatment: 9 Wound  Status Wound Number: 4 Primary Pressure Ulcer Etiology: Wound Location: Coccyx - Medial Wound Open Wounding Event: Pressure Injury Status: Date Acquired: 02/21/2015 Comorbid Anemia, Arrhythmia, Congestive Heart Weeks Of Treatment: 2 History: Failure, Hypertension, Type II Diabetes Clustered Wound: No Photos Photo Uploaded By: Regan Lemming on 03/17/2015 13:31:09 Wound Measurements Length: (cm) 2.4 Width: (cm) 3.1 Depth: (cm) 0.1 Area: (cm) 5.843 Volume: (cm) 0.584 % Reduction in Area: -7.8% % Reduction in Volume: -7.7% Epithelialization: None Tunneling: No Undermining: No Wound Description Classification: Category/Stage III Exudate Amount: Medium Exudate Type: Serosanguineous Exudate Color: red, brown Foul Odor After Cleansing: No Wound Bed Granulation Amount: Medium (34-66%) Exposed Structure Necrotic Amount: Small (1-33%) Fascia Exposed: No Necrotic Quality: Adherent Slough Fat Layer Exposed: No Tendon Exposed: No Muscle Exposed: No ELIZEBETH, RUSHMORE ([id number]) Joint Exposed: No Bone Exposed: No Limited to Skin Breakdown Periwound Skin Texture Texture Color No Abnormalities Noted: No No Abnormalities Noted: No Callus: No Atrophie Blanche: No Crepitus: No Cyanosis: No Excoriation: No Ecchymosis: No Fluctuance: No Erythema: No Friable: No Hemosiderin Staining: No Induration: No Mottled: No Localized Edema: No Pallor: No Rash: No Rubor: No Scarring: No Temperature / Pain Moisture Temperature: No Abnormality No Abnormalities Noted: No Dry / Scaly: No Maceration: No Moist: Yes Wound Preparation Ulcer Cleansing: Rinsed/Irrigated with Saline Topical Anesthetic Applied: Other: lidocaine 4%, Treatment Notes Wound #4 (Medial Coccyx) 1. Cleansed with: Clean wound with Normal Saline 2. Anesthetic Topical Lidocaine 4% cream to wound bed prior to debridement 4. Dressing Applied: Prisma Ag 5. Secondary Dressing Applied Bordered Foam  Dressing Kerlix/Conform Gauze and Kerlix/Conform Notes boarded foam to the sacram and left upper thigh Electronic Signature(s) Signed: 03/17/2015 1:37:20 PM By: Regan Lemming BSN, RN Entered By: Regan Lemming on 03/17/2015 10:59:12 [name] ([id number]) -------------------------------------------------------------------------------- Wound Assessment Details Patient Name: [name] Date of Service: 03/17/2015 11:00 AM Medical Record Number: [id number] Patient Account Number: [id number] Date of Birth/Sex: 11-20-1955 (59 y.o. Female) Treating RN: Afful, RN, BSN, [place] Primary Care Physician: [name] Other Clinician: Referring Physician: [name] Treating Physician/Extender: Frann Rider in Treatment: 9 Wound Status Wound Number: 6 Primary Pressure Ulcer Etiology: Wound Location: Left Upper Leg - Medial Wound Open Wounding Event: Pressure Injury Status: Date Acquired: 02/21/2015 Comorbid Anemia, Arrhythmia, Congestive Heart Weeks Of Treatment: 2 History: Failure, Hypertension, Type II Diabetes Clustered Wound: No Photos Photo Uploaded By: Regan Lemming on 03/17/2015 13:31:36 Wound Measurements Length: (cm) 1 Width: (cm) 4 Depth: (cm) 0.1 Area: (cm) 3.142 Volume: (cm) 0.314 % Reduction in Area: 81.8% % Reduction in Volume: 81.8% Epithelialization: None Tunneling: No Undermining: No Wound Description Classification: Category/Stage III Foul O Diabetic Severity (Wagner): Grade 1 Wound Margin: Distinct, outline attached Exudate Amount: Medium Exudate Type: Serosanguineous Exudate Color: red, brown dor After Cleansing: No Wound Bed Granulation Amount: Medium (34-66%) Exposed Structure Necrotic Amount: Small (1-33%) Fascia Exposed: No Necrotic Quality: Adherent Slough Fat Layer Exposed: No ZYNASIA, GIECK. ([id number]) Tendon Exposed: No Muscle Exposed: No Joint Exposed: No Bone Exposed: No Limited to Skin Breakdown Periwound Skin  Texture Texture Color No Abnormalities Noted: No No Abnormalities Noted: No Callus: No Atrophie Blanche: No Crepitus: No Cyanosis: No Excoriation: No Ecchymosis: No Fluctuance: No Erythema: No Friable: No Hemosiderin Staining: No Induration: No Mottled: No Localized Edema: No Pallor: No Rash: No Rubor: No Scarring: No Temperature / Pain Moisture Temperature: No Abnormality No Abnormalities Noted: No Dry / Scaly: No Maceration: No Moist: Yes Wound Preparation Ulcer Cleansing: Rinsed/Irrigated with  Exudate Type: Serosanguineous Exudate Color: red, brown Foul Odor After Cleansing: No Wound Bed Granulation Amount: Medium (34-66%) Exposed Structure Granulation Quality: Red Fascia Exposed: No Necrotic Amount: Small (1-33%) Fat Layer Exposed: No Necrotic Quality: Adherent Slough Tendon Exposed: No AMORY, MARCELLINO. ([id number]) Muscle Exposed: No Joint Exposed: No Bone Exposed: No Limited to Skin Breakdown Periwound Skin Texture Texture Color No Abnormalities Noted: No No Abnormalities Noted: No Callus: No Atrophie Blanche: No Crepitus: No Cyanosis: No Excoriation: No Ecchymosis: No Fluctuance: No Erythema: No Friable: No Hemosiderin Staining:  No Induration: No Mottled: No Localized Edema: No Pallor: No Rash: No Rubor: No Scarring: No Temperature / Pain Moisture Temperature: No Abnormality No Abnormalities Noted: No Tenderness on Palpation: Yes Dry / Scaly: No Maceration: No Moist: Yes Wound Preparation Ulcer Cleansing: Rinsed/Irrigated with Saline Topical Anesthetic Applied: Other: lidocaine 4%, Treatment Notes Wound #1 (Left Calcaneous) 1. Cleansed with: Clean wound with Normal Saline 2. Anesthetic Topical Lidocaine 4% cream to wound bed prior to debridement 4. Dressing Applied: Prisma Ag 5. Secondary Dressing Applied Bordered Foam Dressing Kerlix/Conform Gauze and Kerlix/Conform Notes boarded foam to the sacram and left upper thigh Electronic Signature(s) Signed: 03/17/2015 1:37:20 PM By: Regan Lemming BSN, RN Entered By: Regan Lemming on 03/17/2015 11:02:41 [name] ([id number]) ZACORIA, REZNICEK ([id number]) -------------------------------------------------------------------------------- Wound Assessment Details Patient Name: [name] Date of Service: 03/17/2015 11:00 AM Medical Record Number: [id number] Patient Account Number: [id number] Date of Birth/Sex: May 18, 1956 (59 y.o. Female) Treating RN: Afful, RN, BSN, [place] Primary Care Physician: [name] Other Clinician: Referring Physician: [name] Treating Physician/Extender: Frann Rider in Treatment: 9 Wound Status Wound Number: 3 Primary Pressure Ulcer Etiology: Wound Location: Nose Wound Open Wounding Event: Pressure Injury Status: Date Acquired: 11/07/2014 Comorbid Anemia, Arrhythmia, Congestive Heart Weeks Of Treatment: 8 History: Failure, Hypertension, Type II Diabetes Clustered Wound: No Photos Photo Uploaded By: Regan Lemming on 03/17/2015 13:31:09 Wound Measurements Length: (cm) 1.1 Width: (cm) 1 Depth: (cm) 0.1 Area: (cm) 0.864 Volume: (cm) 0.086 % Reduction in Area: -57.1% % Reduction in  Volume: 21.8% Epithelialization: Small (1-33%) Tunneling: No Wound Description Classification: Category/Stage III Wound Margin: Indistinct, nonvisible Exudate Amount: None Present Foul Odor After Cleansing: No Wound Bed Granulation Amount: None Present (0%) Exposed Structure Necrotic Amount: Large (67-100%) Fascia Exposed: No Necrotic Quality: Eschar Fat Layer Exposed: No Tendon Exposed: No Muscle Exposed: No Joint Exposed: No AMAIRAH, KOELLNER. ([id number]) Bone Exposed: No Limited to Skin Breakdown Periwound Skin Texture Texture Color No Abnormalities Noted: No No Abnormalities Noted: No Callus: No Atrophie Blanche: No Crepitus: No Cyanosis: No Excoriation: No Ecchymosis: No Fluctuance: No Erythema: No Friable: No Hemosiderin Staining: No Induration: No Mottled: No Localized Edema: No Pallor: No Rash: No Rubor: No Scarring: No Temperature / Pain Moisture Temperature: No Abnormality No Abnormalities Noted: No Dry / Scaly: Yes Maceration: No Moist: No Wound Preparation Ulcer Cleansing: Rinsed/Irrigated with Saline Topical Anesthetic Applied: Other: lidocaine 4%, Treatment Notes Wound #3 (Nose) 1. Cleansed with: Clean wound with Normal Saline 4. Dressing Applied: Other dressing (specify in notes) Notes betadine paint Electronic Signature(s) Signed: 03/17/2015 1:37:20 PM By: Regan Lemming BSN, RN Entered By: Regan Lemming on 03/17/2015 11:01:45 [name] ([id number]) -------------------------------------------------------------------------------- Wound Assessment Details Patient Name: [name] Date of Service: 03/17/2015 11:00 AM Medical Record Number: [id number] Patient Account Number: [id number] Date of Birth/Sex: [birth year] (59 y.o. Female) Treating RN: Baruch Gouty, RN, BSN, Velva Harman Primary Care Physician: [name] Other Clinician: Referring Physician: [name] Treating Physician/Extender: Christin Fudge  Saline Topical Anesthetic Applied: Other: lidoacine 4%, Treatment Notes Wound #6 (Left, Medial Upper Leg) 1. Cleansed with: Clean wound with Normal Saline 2. Anesthetic Topical Lidocaine 4% cream to wound bed prior to debridement 4. Dressing Applied: Prisma Ag 5. Secondary Dressing Applied Bordered Foam Dressing Kerlix/Conform Gauze and Kerlix/Conform Notes boarded foam to the sacram and left upper thigh Electronic Signature(s) Signed: 03/17/2015 1:37:20 PM By: Regan Lemming BSN, RN Entered By: Regan Lemming on 03/17/2015 11:00:09 [name] ([id number]) JULEESA, PAVEL ([id number]) -------------------------------------------------------------------------------- Wound Assessment Details Patient Name: [name] Date of Service: 03/17/2015 11:00 AM Medical Record Number: [id number] Patient Account Number: [id number] Date of Birth/Sex: 10-13-1955 (59 y.o. Female) Treating RN: Afful, RN, BSN, [place] Primary Care Physician: [name] Other Clinician: Referring Physician: [name] Treating Physician/Extender: Frann Rider in Treatment: 9 Wound Status Wound Number: 7 Primary Diabetic Wound/Ulcer of the Lower Etiology: Extremity Wound Location: Right Toe Second Wound Open Wounding Event: Gradually Appeared Status: Date Acquired: 03/10/2015 Comorbid Anemia, Arrhythmia, Congestive Heart Weeks Of Treatment: 1 History: Failure, Hypertension,  Type II Diabetes Clustered Wound: No Photos Photo Uploaded By: Regan Lemming on 03/17/2015 13:31:36 Wound Measurements Length: (cm) 1 Width: (cm) 1.5 Depth: (cm) 0.1 Area: (cm) 1.178 Volume: (cm) 0.118 % Reduction in Area: -44.2% % Reduction in Volume: -43.9% Epithelialization: None Tunneling: No Undermining: No Wound Description Classification: Grade 1 Wound Margin: Flat and Intact Exudate Amount: Medium Exudate Type: Serosanguineous Exudate Color: red, brown Foul Odor After Cleansing: No Wound Bed Granulation Amount: Small (1-33%) Exposed Structure Granulation Quality: Pink, Pale Fascia Exposed: No Necrotic Amount: Medium (34-66%) Fat Layer Exposed: No Necrotic Quality: Adherent Slough Tendon Exposed: No LEILANI, POLSTER. ([id number]) Muscle Exposed: No Joint Exposed: No Bone Exposed: No Limited to Skin Breakdown Periwound Skin Texture Texture Color No Abnormalities Noted: No No Abnormalities Noted: No Callus: No Atrophie Blanche: No Crepitus: No Cyanosis: No Excoriation: No Ecchymosis: No Fluctuance: No Erythema: No Friable: No Hemosiderin Staining: No Induration: No Mottled: No Localized Edema: No Pallor: No Rash: No Rubor: No Scarring: No Temperature / Pain Moisture Temperature: No Abnormality No Abnormalities Noted: No Dry / Scaly: No Maceration: No Moist: Yes Wound Preparation Ulcer Cleansing: Rinsed/Irrigated with Saline Topical Anesthetic Applied: Other: lidocaine 4%, Treatment Notes Wound #7 (Right Toe Second) 1. Cleansed with: Clean wound with Normal Saline 2. Anesthetic Topical Lidocaine 4% cream to wound bed prior to debridement 4. Dressing Applied: Prisma Ag 5. Secondary Dressing Applied Bordered Foam Dressing Kerlix/Conform Gauze and Kerlix/Conform Notes boarded foam to the sacram and left upper thigh Electronic Signature(s) Signed: 03/17/2015 1:37:20 PM By: Regan Lemming BSN, RN Entered By: Regan Lemming on 03/17/2015  11:03:39 [name] ([id number]) [name] ([id number]) -------------------------------------------------------------------------------- [place] Details Patient Name: [name] Date of Service: 03/17/2015 11:00 AM Medical Record Number: [id number] Patient Account Number: [id number] Date of Birth/Sex: November 17, 1955 (59 y.o. Female) Treating RN: Afful, RN, BSN, [place] Primary Care Physician: [name] Other Clinician: Referring Physician: [name] Treating Physician/Extender: Frann Rider in Treatment: 9 Vital Signs Time Taken: 10:52 Temperature (F): 98.0 Height (in): 68 Pulse (bpm): 64 Weight (lbs): 257 Respiratory Rate (breaths/min): 17 Body Mass Index (BMI): 39.1 Blood Pressure (mmHg): 101/49 Reference Range: 80 - 120 mg / dl Electronic Signature(s) Signed: 03/17/2015 1:37:20 PM By: Regan Lemming BSN, RN Entered By: Regan Lemming on 03/17/2015 10:55:10  Saline Topical Anesthetic Applied: Other: lidoacine 4%, Treatment Notes Wound #6 (Left, Medial Upper Leg) 1. Cleansed with: Clean wound with Normal Saline 2. Anesthetic Topical Lidocaine 4% cream to wound bed prior to debridement 4. Dressing Applied: Prisma Ag 5. Secondary Dressing Applied Bordered Foam Dressing Kerlix/Conform Gauze and Kerlix/Conform Notes boarded foam to the sacram and left upper thigh Electronic Signature(s) Signed: 03/17/2015 1:37:20 PM By: Regan Lemming BSN, RN Entered By: Regan Lemming on 03/17/2015 11:00:09 [name] ([id number]) JULEESA, PAVEL ([id number]) -------------------------------------------------------------------------------- Wound Assessment Details Patient Name: [name] Date of Service: 03/17/2015 11:00 AM Medical Record Number: [id number] Patient Account Number: [id number] Date of Birth/Sex: 10-13-1955 (59 y.o. Female) Treating RN: Afful, RN, BSN, [place] Primary Care Physician: [name] Other Clinician: Referring Physician: [name] Treating Physician/Extender: Frann Rider in Treatment: 9 Wound Status Wound Number: 7 Primary Diabetic Wound/Ulcer of the Lower Etiology: Extremity Wound Location: Right Toe Second Wound Open Wounding Event: Gradually Appeared Status: Date Acquired: 03/10/2015 Comorbid Anemia, Arrhythmia, Congestive Heart Weeks Of Treatment: 1 History: Failure, Hypertension,  Type II Diabetes Clustered Wound: No Photos Photo Uploaded By: Regan Lemming on 03/17/2015 13:31:36 Wound Measurements Length: (cm) 1 Width: (cm) 1.5 Depth: (cm) 0.1 Area: (cm) 1.178 Volume: (cm) 0.118 % Reduction in Area: -44.2% % Reduction in Volume: -43.9% Epithelialization: None Tunneling: No Undermining: No Wound Description Classification: Grade 1 Wound Margin: Flat and Intact Exudate Amount: Medium Exudate Type: Serosanguineous Exudate Color: red, brown Foul Odor After Cleansing: No Wound Bed Granulation Amount: Small (1-33%) Exposed Structure Granulation Quality: Pink, Pale Fascia Exposed: No Necrotic Amount: Medium (34-66%) Fat Layer Exposed: No Necrotic Quality: Adherent Slough Tendon Exposed: No LEILANI, POLSTER. ([id number]) Muscle Exposed: No Joint Exposed: No Bone Exposed: No Limited to Skin Breakdown Periwound Skin Texture Texture Color No Abnormalities Noted: No No Abnormalities Noted: No Callus: No Atrophie Blanche: No Crepitus: No Cyanosis: No Excoriation: No Ecchymosis: No Fluctuance: No Erythema: No Friable: No Hemosiderin Staining: No Induration: No Mottled: No Localized Edema: No Pallor: No Rash: No Rubor: No Scarring: No Temperature / Pain Moisture Temperature: No Abnormality No Abnormalities Noted: No Dry / Scaly: No Maceration: No Moist: Yes Wound Preparation Ulcer Cleansing: Rinsed/Irrigated with Saline Topical Anesthetic Applied: Other: lidocaine 4%, Treatment Notes Wound #7 (Right Toe Second) 1. Cleansed with: Clean wound with Normal Saline 2. Anesthetic Topical Lidocaine 4% cream to wound bed prior to debridement 4. Dressing Applied: Prisma Ag 5. Secondary Dressing Applied Bordered Foam Dressing Kerlix/Conform Gauze and Kerlix/Conform Notes boarded foam to the sacram and left upper thigh Electronic Signature(s) Signed: 03/17/2015 1:37:20 PM By: Regan Lemming BSN, RN Entered By: Regan Lemming on 03/17/2015  11:03:39 [name] ([id number]) [name] ([id number]) -------------------------------------------------------------------------------- [place] Details Patient Name: [name] Date of Service: 03/17/2015 11:00 AM Medical Record Number: [id number] Patient Account Number: [id number] Date of Birth/Sex: November 17, 1955 (59 y.o. Female) Treating RN: Afful, RN, BSN, [place] Primary Care Physician: [name] Other Clinician: Referring Physician: [name] Treating Physician/Extender: Frann Rider in Treatment: 9 Vital Signs Time Taken: 10:52 Temperature (F): 98.0 Height (in): 68 Pulse (bpm): 64 Weight (lbs): 257 Respiratory Rate (breaths/min): 17 Body Mass Index (BMI): 39.1 Blood Pressure (mmHg): 101/49 Reference Range: 80 - 120 mg / dl Electronic Signature(s) Signed: 03/17/2015 1:37:20 PM By: Regan Lemming BSN, RN Entered By: Regan Lemming on 03/17/2015 10:55:10  Saline Topical Anesthetic Applied: Other: lidoacine 4%, Treatment Notes Wound #6 (Left, Medial Upper Leg) 1. Cleansed with: Clean wound with Normal Saline 2. Anesthetic Topical Lidocaine 4% cream to wound bed prior to debridement 4. Dressing Applied: Prisma Ag 5. Secondary Dressing Applied Bordered Foam Dressing Kerlix/Conform Gauze and Kerlix/Conform Notes boarded foam to the sacram and left upper thigh Electronic Signature(s) Signed: 03/17/2015 1:37:20 PM By: Regan Lemming BSN, RN Entered By: Regan Lemming on 03/17/2015 11:00:09 [name] ([id number]) JULEESA, PAVEL ([id number]) -------------------------------------------------------------------------------- Wound Assessment Details Patient Name: [name] Date of Service: 03/17/2015 11:00 AM Medical Record Number: [id number] Patient Account Number: [id number] Date of Birth/Sex: 10-13-1955 (59 y.o. Female) Treating RN: Afful, RN, BSN, [place] Primary Care Physician: [name] Other Clinician: Referring Physician: [name] Treating Physician/Extender: Frann Rider in Treatment: 9 Wound Status Wound Number: 7 Primary Diabetic Wound/Ulcer of the Lower Etiology: Extremity Wound Location: Right Toe Second Wound Open Wounding Event: Gradually Appeared Status: Date Acquired: 03/10/2015 Comorbid Anemia, Arrhythmia, Congestive Heart Weeks Of Treatment: 1 History: Failure, Hypertension,  Type II Diabetes Clustered Wound: No Photos Photo Uploaded By: Regan Lemming on 03/17/2015 13:31:36 Wound Measurements Length: (cm) 1 Width: (cm) 1.5 Depth: (cm) 0.1 Area: (cm) 1.178 Volume: (cm) 0.118 % Reduction in Area: -44.2% % Reduction in Volume: -43.9% Epithelialization: None Tunneling: No Undermining: No Wound Description Classification: Grade 1 Wound Margin: Flat and Intact Exudate Amount: Medium Exudate Type: Serosanguineous Exudate Color: red, brown Foul Odor After Cleansing: No Wound Bed Granulation Amount: Small (1-33%) Exposed Structure Granulation Quality: Pink, Pale Fascia Exposed: No Necrotic Amount: Medium (34-66%) Fat Layer Exposed: No Necrotic Quality: Adherent Slough Tendon Exposed: No LEILANI, POLSTER. ([id number]) Muscle Exposed: No Joint Exposed: No Bone Exposed: No Limited to Skin Breakdown Periwound Skin Texture Texture Color No Abnormalities Noted: No No Abnormalities Noted: No Callus: No Atrophie Blanche: No Crepitus: No Cyanosis: No Excoriation: No Ecchymosis: No Fluctuance: No Erythema: No Friable: No Hemosiderin Staining: No Induration: No Mottled: No Localized Edema: No Pallor: No Rash: No Rubor: No Scarring: No Temperature / Pain Moisture Temperature: No Abnormality No Abnormalities Noted: No Dry / Scaly: No Maceration: No Moist: Yes Wound Preparation Ulcer Cleansing: Rinsed/Irrigated with Saline Topical Anesthetic Applied: Other: lidocaine 4%, Treatment Notes Wound #7 (Right Toe Second) 1. Cleansed with: Clean wound with Normal Saline 2. Anesthetic Topical Lidocaine 4% cream to wound bed prior to debridement 4. Dressing Applied: Prisma Ag 5. Secondary Dressing Applied Bordered Foam Dressing Kerlix/Conform Gauze and Kerlix/Conform Notes boarded foam to the sacram and left upper thigh Electronic Signature(s) Signed: 03/17/2015 1:37:20 PM By: Regan Lemming BSN, RN Entered By: Regan Lemming on 03/17/2015  11:03:39 [name] ([id number]) [name] ([id number]) -------------------------------------------------------------------------------- [place] Details Patient Name: [name] Date of Service: 03/17/2015 11:00 AM Medical Record Number: [id number] Patient Account Number: [id number] Date of Birth/Sex: November 17, 1955 (59 y.o. Female) Treating RN: Afful, RN, BSN, [place] Primary Care Physician: [name] Other Clinician: Referring Physician: [name] Treating Physician/Extender: Frann Rider in Treatment: 9 Vital Signs Time Taken: 10:52 Temperature (F): 98.0 Height (in): 68 Pulse (bpm): 64 Weight (lbs): 257 Respiratory Rate (breaths/min): 17 Body Mass Index (BMI): 39.1 Blood Pressure (mmHg): 101/49 Reference Range: 80 - 120 mg / dl Electronic Signature(s) Signed: 03/17/2015 1:37:20 PM By: Regan Lemming BSN, RN Entered By: Regan Lemming on 03/17/2015 10:55:10

## 2015-03-19 ENCOUNTER — Other Ambulatory Visit
Admission: RE | Admit: 2015-03-19 | Discharge: 2015-03-19 | Disposition: A | Discharge status: Home / Self Care | Payer: PPO | Source: Ambulatory Visit | Attending: Cardiovascular Disease | Admitting: Cardiovascular Disease

## 2015-03-19 DIAGNOSIS — M19079 Primary osteoarthritis, unspecified ankle and foot: Secondary | ICD-10-CM | POA: Insufficient documentation

## 2015-03-19 LAB — COMPREHENSIVE METABOLIC PANEL
ALBUMIN: 2.6 g/dL — AB (ref 3.5–5.0)
ALT: 9 U/L — ABNORMAL LOW (ref 14–54)
AST: 30 U/L (ref 15–41)
Alkaline Phosphatase: 95 U/L (ref 38–126)
Anion gap: 12 (ref 5–15)
BUN: 21 mg/dL — ABNORMAL HIGH (ref 6–20)
CALCIUM: 8.3 mg/dL — AB (ref 8.9–10.3)
CO2: 31 mmol/L (ref 22–32)
Chloride: 96 mmol/L — ABNORMAL LOW (ref 101–111)
Creatinine, Ser: 1.8 mg/dL — ABNORMAL HIGH (ref 0.44–1.00)
GFR, EST AFRICAN AMERICAN: 34 mL/min — AB (ref >60)
GFR, EST NON AFRICAN AMERICAN: 29 mL/min — AB (ref >60)
Glucose, Bld: 87 mg/dL (ref 65–99)
Potassium: 3.4 mmol/L — ABNORMAL LOW (ref 3.5–5.1)
Sodium: 139 mmol/L (ref 135–145)
Total Bilirubin: 0.3 mg/dL (ref 0.3–1.2)
Total Protein: 6.8 g/dL (ref 6.5–8.1)

## 2015-03-19 LAB — CBC WITH DIFFERENTIAL/PLATELET
Basophils Absolute: 0 10*3/uL (ref 0–0.1)
Basophils Relative: 0 %
Eosinophils Absolute: 0.1 10*3/uL (ref 0–0.7)
Eosinophils Relative: 2 %
HEMATOCRIT: 30.1 % — AB (ref 35.0–47.0)
HEMOGLOBIN: 9.7 g/dL — AB (ref 12.0–16.0)
Lymphocytes Relative: 31 %
Lymphs Abs: 1.2 10*3/uL (ref 1.0–3.6)
MCH: 24.2 pg — AB (ref 26.0–34.0)
MCHC: 32.1 g/dL (ref 32.0–36.0)
MCV: 75.5 fL — AB (ref 80.0–100.0)
MONOS PCT: 6 %
Monocytes Absolute: 0.2 10*3/uL (ref 0.2–0.9)
NEUTROS ABS: 2.5 10*3/uL (ref 1.4–6.5)
NEUTROS PCT: 61 %
Platelets: 247 10*3/uL (ref 150–440)
RBC: 3.99 MIL/uL (ref 3.80–5.20)
RDW: 19.9 % — AB (ref 11.5–14.5)
WBC: 4 10*3/uL (ref 3.6–11.0)

## 2015-03-19 LAB — VANCOMYCIN, TROUGH: Vancomycin Tr: 11 ug/mL (ref 10–20)

## 2015-03-20 ENCOUNTER — Inpatient Hospital Stay: Payer: PPO

## 2015-03-20 VITALS — BP 112/69 | HR 70 | Temp 95.0°F | Resp 18

## 2015-03-20 DIAGNOSIS — D509 Iron deficiency anemia, unspecified: Secondary | ICD-10-CM

## 2015-03-20 MED ORDER — SODIUM CHLORIDE 0.9 % IV SOLN
500.0000 mg | Freq: Once | INTRAVENOUS | Status: AC
  Administered 2015-03-20: 500 mg via INTRAVENOUS
  Filled 2015-03-20: qty 25

## 2015-03-20 MED ORDER — SODIUM CHLORIDE 0.9 % IV SOLN
INTRAVENOUS | Status: DC
  Administered 2015-03-20: 10:00:00 via INTRAVENOUS
  Filled 2015-03-20: qty 250

## 2015-03-20 MED ORDER — SODIUM CHLORIDE 0.9 % IJ SOLN
3.0000 mL | Freq: Once | INTRAMUSCULAR | Status: DC | PRN
  Filled 2015-03-20: qty 10

## 2015-03-20 NOTE — Telephone Encounter (Signed)
Spoke with [name] --- patient being weaned off of Amiodarone; upon review of cardiology note from 03/17/15, Amiodarone not on medication list.  [name] not sure if pt still taking Cipro.  Left message with patient to review medications.

## 2015-03-21 ENCOUNTER — Ambulatory Visit (INDEPENDENT_AMBULATORY_CARE_PROVIDER_SITE_OTHER): Payer: PPO | Admitting: Neurology

## 2015-03-21 DIAGNOSIS — E0842 Diabetes mellitus due to underlying condition with diabetic polyneuropathy: Secondary | ICD-10-CM

## 2015-03-21 DIAGNOSIS — G610 Guillain-Barre syndrome: Secondary | ICD-10-CM

## 2015-03-21 DIAGNOSIS — G8332 Monoplegia, unspecified affecting left dominant side: Secondary | ICD-10-CM

## 2015-03-21 DIAGNOSIS — G8220 Paraplegia, unspecified: Secondary | ICD-10-CM

## 2015-03-21 DIAGNOSIS — R269 Unspecified abnormalities of gait and mobility: Secondary | ICD-10-CM

## 2015-03-21 NOTE — Procedures (Signed)
[facility]  [address], [address]  [place], [place] [address] Tel: [phone] Fax:  [phone] Test Date:  03/21/2015  Patient: [name] DOB: 10-Aug-1956 Physician: Narda Amber, DO  Sex: Female Height: 5\' 8"  Ref Phys: Narda Amber, DO  ID#: [id number] Temp: 35.9C Technician: Jerilynn Mages. Dean   Patient Complaints: This is a 59 year old female presenting for evaluation of bilateral hand paresthesias and weakness which has been progressive since spring 2016.   NCV & EMG Findings: Extensive electrodiagnostic testing of the right upper extremity and additional studies of the left shows:  1. Bilateral median sensory responses in the left ulnar sensory responses are absent. Bilateral radial and right ulnar sensory response is within normal limits. 2. All motor responses including bilateral median, bilateral ulnar, and left radial nerves shows reduced amplitude and conduction velocity slowing.  3. Ulnar F wave is within normal limits. 4. Chronic motor axon loss changes are seen affecting nearly all the tested muscles, which is severe distally where there is also active changes. Active motor axon loss is also seen affecting the cervical paraspinal muscles.  Of note, sparse myopathic motor units were seen involving the flexor pollicis longus and biceps muscles as evidenced by early recruitment and small complex motor units; these findings are of unclear clinical significance..  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   ___________________________ Narda Amber, DO    Nerve Conduction Studies Anti Sensory Summary Table   Site NR Peak (ms) Norm Peak (ms) P-T Amp (V) Norm P-T Amp  Left Median Anti Sensory (2nd Digit)  35.9C  Wrist NR  <3.8  >10  Right Median Anti Sensory (2nd Digit)  35.9C  Wrist NR  <3.8  >10  Left Radial Anti Sensory (Base 1st Digit)   35.9C  Wrist    2.3 <2.8 17.0 >10  Right Radial Anti Sensory (Base 1st Digit)  35.9C  Wrist    2.3 <2.8 14.9 >10  Left Ulnar Anti Sensory (5th Digit)  35.9C  Wrist NR  <3.2  >5  Right Ulnar Anti Sensory (5th Digit)  35.9C  Wrist    3.2 <3.2 11.2 >5   Motor Summary Table   Site NR Onset (ms) Norm Onset (ms) O-P Amp (mV) Norm O-P Amp Site1 Site2 Delta-0 (ms) Dist (cm) Vel (m/s) Norm Vel (m/s)  Left Median Motor (Abd Poll Brev)  35.9C  Wrist    4.8 <4.0 3.4 >5 Elbow Wrist 5.4 25.0 46 >50  Elbow    10.2  2.7         Right Median Motor (Abd Poll Brev)  35.9C  Wrist    7.0 <4.0 1.1 >5 Elbow Wrist 8.3 24.0 29 >50  Elbow    15.3  0.4         Left Radial Motor (Ext Ind Prop)  35.9C  7cm    2.6 <3.1 1.5 >5 Brachi 7cm 2.2 10.0 45 >50  Brachi    4.8  1.4  B SpriGro Brachi 2.2 10.0 45   B SpriGro    7.0  1.4  B SpriGro A SpriGrv 0.5 3.0 60   A SpriGrv    7.5  1.2         Left Ulnar Motor (Abd Dig Minimi)  35.9C  Wrist    3.0 <3.1 1.5 >7 B Elbow Wrist 4.3 19.0 44 >50  B Elbow    7.3  0.8  A Elbow B Elbow 2.5  10.0 40 >50  A Elbow    9.8  0.8         Right Ulnar Motor (Abd Dig Minimi)  35.9C  Wrist    2.9 <3.1 2.2 >7 B Elbow Wrist 4.5 22.0 49 >50  B Elbow    7.4  1.7  A Elbow B Elbow 2.1 10.0 48 >50  A Elbow    9.5  1.6          F Wave Studies   NR F-Lat (ms) Lat Norm (ms) L-R F-Lat (ms)  Left Ulnar (Mrkrs) (Abd Dig Min)  35.9C     32.53 <33    EMG   Side Muscle Ins Act Fibs Psw Fasc Number Recrt Dur Dur. Amp Amp. Poly Poly. Comment  Left PronatorTeres Nml Nml Nml Nml 1- Mod-R Nml Nml Nml Nml Nml Nml N/A  Left Abd Poll Brev Nml Nml Nml Nml 1- Mod-V Few 1+ Nml Nml Nml Nml ATR  Left Deltoid Nml 1+ Nml Nml 1- Mod Nml Nml Nml Nml Nml Nml N/A  Right Triceps Nml Nml Nml Nml 1- Rapid Some 1+ Nml Nml Nml Nml N/A  Right Deltoid Nml Nml Nml Nml 1- Mod-R Nml Nml Nml Nml Nml Nml N/A  Left FlexPolLong Nml Nml Nml Nml 1+ Mod Some 1- Some 1+ Nml Nml E.R.  Left Biceps Nml Nml Nml Nml 1+ Mod  Some 1+ Nml Nml Nml Nml E.R.  Left 1stDorInt Nml Nml Nml Nml 2- Mod-R Some 1+ Nml Nml Nml Nml N/A  Left Triceps Nml Nml Nml Nml 2- Rapid Many 1+ Many 1+ Nml Nml N/A  Right PronatorTeres Nml Nml Nml Nml 2- Rapid Some 1+ Nml Nml Nml Nml N/A  Right Biceps Nml Nml Nml Nml 2- Rapid Some 1+ Nml Nml Nml Nml N/A  Right Abd Poll Brev Nml Nml Nml Nml 2- Mod-R Nml Nml Nml Nml Nml Nml N/A  Right 1stDorInt Nml 1+ Nml Nml 2- Rapid Many 1+ Some 1+ Nml Nml N/A  Left Ext Indicis Nml 1+ Nml Nml 3- Rapid Nml Nml Nml Nml Nml Nml ATR  Right Ext Indicis Nml Nml Nml Nml Nml Nml Nml Nml Nml Nml Nml Nml N/A  Left Cervical Parasp Low CRD 1+ Nml Nml Nml Nml Nml Nml Nml Nml Nml Nml N/A  Left ABD Dig Min Nml Nml Nml Nml [facility] Rapid Few 1+ Nml Nml Nml Nml ATR      Waveforms:

## 2015-03-23 ENCOUNTER — Other Ambulatory Visit: Payer: Self-pay | Admitting: Surgery

## 2015-03-23 ENCOUNTER — Ambulatory Visit: Payer: PPO

## 2015-03-23 ENCOUNTER — Ambulatory Visit
Admission: RE | Admit: 2015-03-23 | Discharge: 2015-03-23 | Disposition: A | Discharge status: Home / Self Care | Payer: PPO | Source: Ambulatory Visit | Attending: Surgery | Admitting: Surgery

## 2015-03-23 DIAGNOSIS — E119 Type 2 diabetes mellitus without complications: Secondary | ICD-10-CM | POA: Insufficient documentation

## 2015-03-23 DIAGNOSIS — R0602 Shortness of breath: Secondary | ICD-10-CM

## 2015-03-23 DIAGNOSIS — R238 Other skin changes: Secondary | ICD-10-CM | POA: Diagnosis present

## 2015-03-23 DIAGNOSIS — J90 Pleural effusion, not elsewhere classified: Secondary | ICD-10-CM | POA: Insufficient documentation

## 2015-03-23 MED ORDER — ALBUTEROL SULFATE (2.5 MG/3ML) 0.083% IN NEBU: 2.5000 mg | INHALATION_SOLUTION | Freq: Once | RESPIRATORY_TRACT | Status: DC

## 2015-03-24 ENCOUNTER — Encounter: Payer: PPO | Admitting: Surgery

## 2015-03-24 DIAGNOSIS — L97512 Non-pressure chronic ulcer of other part of right foot with fat layer exposed: Secondary | ICD-10-CM | POA: Diagnosis not present

## 2015-03-27 ENCOUNTER — Other Ambulatory Visit: Payer: Self-pay | Admitting: *Deleted

## 2015-03-27 DIAGNOSIS — R269 Unspecified abnormalities of gait and mobility: Secondary | ICD-10-CM

## 2015-03-27 DIAGNOSIS — G610 Guillain-Barre syndrome: Secondary | ICD-10-CM

## 2015-03-27 DIAGNOSIS — G8220 Paraplegia, unspecified: Secondary | ICD-10-CM

## 2015-03-27 DIAGNOSIS — G8332 Monoplegia, unspecified affecting left dominant side: Secondary | ICD-10-CM

## 2015-03-27 DIAGNOSIS — E0842 Diabetes mellitus due to underlying condition with diabetic polyneuropathy: Secondary | ICD-10-CM

## 2015-03-28 ENCOUNTER — Ambulatory Visit (INDEPENDENT_AMBULATORY_CARE_PROVIDER_SITE_OTHER): Payer: PPO | Admitting: Neurology

## 2015-03-28 DIAGNOSIS — E0842 Diabetes mellitus due to underlying condition with diabetic polyneuropathy: Secondary | ICD-10-CM

## 2015-03-28 DIAGNOSIS — G610 Guillain-Barre syndrome: Secondary | ICD-10-CM

## 2015-03-28 DIAGNOSIS — G8220 Paraplegia, unspecified: Secondary | ICD-10-CM

## 2015-03-28 DIAGNOSIS — G8332 Monoplegia, unspecified affecting left dominant side: Secondary | ICD-10-CM

## 2015-03-28 DIAGNOSIS — R269 Unspecified abnormalities of gait and mobility: Secondary | ICD-10-CM

## 2015-03-28 NOTE — Procedures (Signed)
[facility]  [address], [address]  [place], [place] [address] Tel: [phone] Fax:  [phone] Test Date:  03/28/2015  Patient: [name] DOB: [birth year] Physician: Narda Amber, DO  Sex: Female Height: 5\' 8"  Ref Phys: [name], M.D.  ID#: [id number] Temp: 33.3C Technician: Jerilynn Mages. Dean   Patient Complaints: This is a 59 year-old diabetic female presenting for evaluation of bilateral feet paresthesias and weakness. Electrodiagnostic testing of the upper extremity is concerning for subacute acute polyradiculoneuropathy. She has a history of lumbar decompression and fusion.  NCV & EMG Findings: Extensive electrodiagnostic testing of the right lower extremity shows:  1. Right sural and superficial peroneal sensory responses are absent. 2. Right peroneal motor response is absent at the extensor digitorum brevis are markedly reduced at the tibialis anterior muscle. Right tibial motor response is also barely present. 3. H reflex studies is absent. Tibial F-wave response is within normal limits. 4. Chronic motor axon loss changes are seen affecting nearly all of the tested muscle and conform to a gradient pattern. Active motor axon loss changes are seen affecting the flexor digitorum longus muscle.    Impression: The electrophysiologic findings are most consistent with a severe generalized sensorimotor polyneuropathy, axon loss in type, affecting the right lower extremity. A superimposed multilevel intraspinal canal lesion (i.e. radiculopathy) cannot be excluded.   ___________________________ Narda Amber, DO    Nerve Conduction Studies Anti Sensory Summary Table   Site NR Peak (ms) Norm Peak (ms) P-T Amp (V) Norm P-T Amp  Right Sup Peroneal Anti Sensory (Ant Lat Mall)  33.3C  12 cm NR  <4.6  >3  Right Sural Anti Sensory (Lat Mall)  33.3C  Calf NR  <4.6  >3   Motor Summary Table   Site NR Onset (ms) Norm Onset (ms) O-P Amp (mV) Norm O-P Amp Site1 Site2  Delta-0 (ms) Dist (cm) Vel (m/s) Norm Vel (m/s)  Right Peroneal Motor (Ext Dig Brev)  33.3C  Ankle NR  <6.0  >2.5 B Fib Ankle  0.0  >40  B Fib NR     Poplt B Fib  0.0  >40  Poplt NR            Right Peroneal TA Motor (Tib Ant)  33.3C  Fib Head    3.8 <4.5 1.3 >3 Poplit Fib Head 2.0 8.0 40 >40  Poplit    5.8  1.1         Right Tibial Motor (Abd Hall Brev)  33.3C  Ankle    3.9 <6.0 0.7 >4 Knee Ankle 6.1 29.0 48 >40  Knee    10.0  0.2          F Wave Studies   NR F-Lat (ms) Lat Norm (ms) L-R F-Lat (ms)  Right Tibial (Mrkrs) (Abd Hallucis)  33.3C     38.48 <55    H Reflex Studies   NR H-Lat (ms) Lat Norm (ms) L-R H-Lat (ms)  Right Tibial (Gastroc)  33.3C  NR  <35    EMG   Side Muscle Ins Act Fibs Psw Fasc Number Recrt Dur Dur. Amp Amp. Poly Poly. Comment  Right AntTibialis Nml Nml Nml Nml 3- Rapid Many 1+ Many 1+ Many 1+ N/A  Right Flex Dig Long Nml 1+ Nml Nml 2- Rapid Many 1+ Many 1+ Nml Nml N/A  Right AdductorLong Nml Nml Nml Nml 2- Mod-R Some 1+ Some 1+ Nml Nml N/A  Right BicepsFemS Nml Nml Nml Nml 2- Rapid Some 1+ Some  1+ Nml Nml N/A  Right Gastroc Nml Nml Nml Nml 1- Rapid Some 1+ Some 1+ Some 1+ N/A  Right GluteusMed Nml Nml Nml Nml 1- Rapid Some 1+ Some 1+ Some 1+ N/A  Right RectFemoris Nml Nml Nml Nml 1- Rapid Some 1+ Some 1+ Nml Nml N/A      Waveforms:

## 2015-03-30 ENCOUNTER — Encounter: Payer: PPO | Admitting: Surgery

## 2015-03-30 DIAGNOSIS — L97512 Non-pressure chronic ulcer of other part of right foot with fat layer exposed: Secondary | ICD-10-CM | POA: Diagnosis not present

## 2015-03-31 ENCOUNTER — Encounter: Payer: PPO | Admitting: Surgery

## 2015-03-31 ENCOUNTER — Other Ambulatory Visit: Payer: Self-pay | Admitting: *Deleted

## 2015-03-31 DIAGNOSIS — R269 Unspecified abnormalities of gait and mobility: Secondary | ICD-10-CM

## 2015-03-31 DIAGNOSIS — G8332 Monoplegia, unspecified affecting left dominant side: Secondary | ICD-10-CM

## 2015-03-31 DIAGNOSIS — E0842 Diabetes mellitus due to underlying condition with diabetic polyneuropathy: Secondary | ICD-10-CM

## 2015-03-31 DIAGNOSIS — G610 Guillain-Barre syndrome: Secondary | ICD-10-CM

## 2015-03-31 DIAGNOSIS — G8220 Paraplegia, unspecified: Secondary | ICD-10-CM

## 2015-03-31 NOTE — Progress Notes (Signed)
4:2.042] Volume (cm) : [1:0.02] [3:0.079] [4:0.204] % Reduction in Area: [1:95.00%] [3:-42.70%] [4:62.30%] % Reduction in Volume: 94.90% [3:28.20%] [4:62.40%] Classification: [1:Grade 2] [3:Category/Stage III] [4:Category/Stage III] Periwound Skin Texture: No Abnormalities Noted [3:No Abnormalities Noted] [4:No Abnormalities Noted] Periwound Skin [1:No Abnormalities Noted] [3:No Abnormalities Noted] [4:No Abnormalities Noted] Moisture: Periwound Skin Color: No Abnormalities Noted [3:No Abnormalities Noted] [4:No Abnormalities Noted] Tenderness on [1:No] [3:No] [4:No] Wound Number: 6 7 N/A Photos: No Photos No Photos N/A Wound Location: Left, Medial Upper Leg Right Toe Second N/A Wounding Event: Pressure Injury Gradually Appeared N/A 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 Date Acquired: 02/21/2015 03/10/2015 N/A Weeks of Treatment: 3 2 N/A [name] ([id number]) Wound Status: Open Open  N/A Measurements L x W x D 0x0x0 0.6x0.9x0.1 N/A (cm) Area (cm) : 0 0.424 N/A Volume (cm) : 0 0.042 N/A % Reduction in Area: 100.00% 48.10% N/A % Reduction in Volume: 100.00% 48.80% N/A Classification: Category/Stage III Grade 1 N/A Periwound Skin Texture: No Abnormalities Noted No Abnormalities Noted N/A Periwound Skin No Abnormalities Noted No Abnormalities Noted N/A Moisture: Periwound Skin Color: No Abnormalities Noted No Abnormalities Noted N/A Tenderness on No No N/A Palpation: Treatment Notes Electronic Signature(s) Signed: 03/30/2015 5:53:15 PM By: Regan Lemming BSN, RN Entered By: Regan Lemming on 03/30/2015 13:33:46 [name] ([id number]) -------------------------------------------------------------------------------- Multi-Disciplinary Care Plan Details Patient Name: [name] Date of Service: 03/30/2015 1:00 PM Medical Record Number: [id number] Patient Account Number: [id number] Date of Birth/Sex: 1956-09-08 (59 y.o. Female) Treating RN: Afful, RN, BSN, Velva Harman Primary Care Physician: [name] Other Clinician: Referring Physician: [name] Treating Physician/Extender: Frann Rider in Treatment: 11 Active Inactive Abuse / Safety / Falls / Self Care Management Nursing Diagnoses: Impaired physical mobility Potential for falls Self care deficit: actual or potential Goals: Patient/caregiver will verbalize understanding of skin care regimen Date Initiated: 01/10/2015 Goal Status: Active Patient/caregiver will verbalize/demonstrate measures taken to improve the patient's personal safety Date Initiated: 01/10/2015 Goal Status: Active Patient/caregiver will verbalize/demonstrate measures taken to prevent injury and/or falls Date Initiated: 01/10/2015 Goal Status: Active Patient/caregiver will verbalize/demonstrate understanding of what to do in case of emergency Date Initiated: 01/10/2015 Goal Status: Active Interventions: Assess fall risk on  admission and as needed Assess impairment of mobility on admission and as needed per policy Provide education on basic hygiene Provide education on fall prevention Provide education on personal and home safety Provide education on safe transfers Treatment Activities: Education provided on Basic Hygiene : 01/10/2015 Notes: Nutrition [name], [name] ([id number]) Nursing Diagnoses: Impaired glucose control: actual or potential Potential for alteratiion in Nutrition/Potential for imbalanced nutrition Goals: Patient/caregiver agrees to and verbalizes understanding of need to use nutritional supplements and/or vitamins as prescribed Date Initiated: 01/10/2015 Goal Status: Active Interventions: Assess HgA1c results as ordered upon admission and as needed Treatment Activities: Patient referred to Primary Care Physician for further nutritional evaluation : 03/30/2015 Notes: Orientation to the Wound Care Program Nursing Diagnoses: Knowledge deficit related to the wound healing center program Goals: Patient/caregiver will verbalize understanding of the [place] Program Date Initiated: 01/10/2015 Goal Status: Active Interventions: Provide education on orientation to the wound center Notes: Osteomyelitis Nursing Diagnoses: Potential for infection: osteomyelitis Goals: Diagnostic evaluation for osteomyelitis completed as ordered Date Initiated: 01/17/2015 Goal Status: Active Patient/caregiver will verbalize understanding of disease process and disease management Date Initiated: 01/17/2015 Goal Status: Active Signs and symptoms for osteomyelitis will be recognized and promptly addressed [name], [name] ([id number]) Date Initiated: 01/17/2015  Referring Physician: [name] Treating Physician/Extender: Frann Rider in Treatment: 11 Wound Status Wound Number: 6 Primary Etiology: Pressure Ulcer Wound Location: Left, Medial Upper Leg Wound Status: Open Wounding Event: Pressure Injury Date Acquired: 02/21/2015 Weeks Of Treatment: 3 Clustered Wound: No Photos Photo Uploaded By: Regan Lemming on 03/30/2015 17:13:50 Wound Measurements Length: (cm) 0 Width: (cm) 0 Depth: (cm) 0 Area: (cm) 0 Volume: (cm) 0 % Reduction in Area: 100% % Reduction in Volume: 100% Wound Description Classification: Category/Stage III Periwound Skin  Texture Texture Color No Abnormalities Noted: No No Abnormalities Noted: No Moisture No Abnormalities Noted: No Electronic Signature(s) Signed: 03/30/2015 5:53:15 PM By: Regan Lemming BSN, RN [name] ([id number]) Entered By: Regan Lemming on 03/30/2015 13:27:48 [name] ([id number]) -------------------------------------------------------------------------------- Wound Assessment Details Patient Name: [name] Date of Service: 03/30/2015 1:00 PM Medical Record Number: [id number] Patient Account Number: [id number] Date of Birth/Sex: 20-Jul-1956 (59 y.o. Female) Treating RN: Afful, RN, BSN, [place] Primary Care Physician: [name] Other Clinician: Referring Physician: [name] Treating Physician/Extender: Frann Rider in Treatment: 11 Wound Status Wound Number: 7 Primary Diabetic Wound/Ulcer of the Lower Etiology: Extremity Wound Location: Right Toe Second Wound Status: Open Wounding Event: Gradually Appeared Date Acquired: 03/10/2015 Weeks Of Treatment: 2 Clustered Wound: No Photos Photo Uploaded By: Regan Lemming on 03/30/2015 17:13:50 Wound Measurements Length: (cm) 0.6 Width: (cm) 0.9 Depth: (cm) 0.1 Area: (cm) 0.424 Volume: (cm) 0.042 % Reduction in Area: 48.1% % Reduction in Volume: 48.8% Wound Description Classification: Grade 1 Periwound Skin Texture Texture Color No Abnormalities Noted: No No Abnormalities Noted: No Moisture No Abnormalities Noted: No Treatment Notes Wound #7 (Right Toe Second) 1. Cleansed with: [name] ([id number]) Clean wound with Normal Saline 3. Peri-wound Care: Skin Prep 4. Dressing Applied: Prisma Ag Other dressing (specify in notes) 5. Secondary Dressing Applied Bordered Foam Dressing Notes betadine paint to nose Electronic Signature(s) Signed: 03/30/2015 5:53:15 PM By: Regan Lemming BSN, RN Entered By: Regan Lemming on 03/30/2015 13:27:48 [name]  ([id number]) -------------------------------------------------------------------------------- [place] Details Patient Name: [name] Date of Service: 03/30/2015 1:00 PM Medical Record Number: [id number] Patient Account Number: [id number] Date of Birth/Sex: 1956/10/06 (59 y.o. Female) Treating RN: Afful, RN, BSN, [place] Primary Care Physician: [name] Other Clinician: Referring Physician: [name] Treating Physician/Extender: Frann Rider in Treatment: 11 Vital Signs Time Taken: 13:28 Temperature (F): 98.4 Height (in): 68 Pulse (bpm): 76 Weight (lbs): 257 Respiratory Rate (breaths/min): 16 Body Mass Index (BMI): 39.1 Blood Pressure (mmHg): 118/66 Reference Range: 80 - 120 mg / dl Electronic Signature(s) Signed: 03/30/2015 5:53:15 PM By: Regan Lemming BSN, RN Entered By: Regan Lemming on 03/30/2015 13:28:58  Referring Physician: [name] Treating Physician/Extender: Frann Rider in Treatment: 11 Wound Status Wound Number: 6 Primary Etiology: Pressure Ulcer Wound Location: Left, Medial Upper Leg Wound Status: Open Wounding Event: Pressure Injury Date Acquired: 02/21/2015 Weeks Of Treatment: 3 Clustered Wound: No Photos Photo Uploaded By: Regan Lemming on 03/30/2015 17:13:50 Wound Measurements Length: (cm) 0 Width: (cm) 0 Depth: (cm) 0 Area: (cm) 0 Volume: (cm) 0 % Reduction in Area: 100% % Reduction in Volume: 100% Wound Description Classification: Category/Stage III Periwound Skin  Texture Texture Color No Abnormalities Noted: No No Abnormalities Noted: No Moisture No Abnormalities Noted: No Electronic Signature(s) Signed: 03/30/2015 5:53:15 PM By: Regan Lemming BSN, RN [name] ([id number]) Entered By: Regan Lemming on 03/30/2015 13:27:48 [name] ([id number]) -------------------------------------------------------------------------------- Wound Assessment Details Patient Name: [name] Date of Service: 03/30/2015 1:00 PM Medical Record Number: [id number] Patient Account Number: [id number] Date of Birth/Sex: 20-Jul-1956 (59 y.o. Female) Treating RN: Afful, RN, BSN, [place] Primary Care Physician: [name] Other Clinician: Referring Physician: [name] Treating Physician/Extender: Frann Rider in Treatment: 11 Wound Status Wound Number: 7 Primary Diabetic Wound/Ulcer of the Lower Etiology: Extremity Wound Location: Right Toe Second Wound Status: Open Wounding Event: Gradually Appeared Date Acquired: 03/10/2015 Weeks Of Treatment: 2 Clustered Wound: No Photos Photo Uploaded By: Regan Lemming on 03/30/2015 17:13:50 Wound Measurements Length: (cm) 0.6 Width: (cm) 0.9 Depth: (cm) 0.1 Area: (cm) 0.424 Volume: (cm) 0.042 % Reduction in Area: 48.1% % Reduction in Volume: 48.8% Wound Description Classification: Grade 1 Periwound Skin Texture Texture Color No Abnormalities Noted: No No Abnormalities Noted: No Moisture No Abnormalities Noted: No Treatment Notes Wound #7 (Right Toe Second) 1. Cleansed with: [name] ([id number]) Clean wound with Normal Saline 3. Peri-wound Care: Skin Prep 4. Dressing Applied: Prisma Ag Other dressing (specify in notes) 5. Secondary Dressing Applied Bordered Foam Dressing Notes betadine paint to nose Electronic Signature(s) Signed: 03/30/2015 5:53:15 PM By: Regan Lemming BSN, RN Entered By: Regan Lemming on 03/30/2015 13:27:48 [name]  ([id number]) -------------------------------------------------------------------------------- [place] Details Patient Name: [name] Date of Service: 03/30/2015 1:00 PM Medical Record Number: [id number] Patient Account Number: [id number] Date of Birth/Sex: 1956/10/06 (59 y.o. Female) Treating RN: Afful, RN, BSN, [place] Primary Care Physician: [name] Other Clinician: Referring Physician: [name] Treating Physician/Extender: Frann Rider in Treatment: 11 Vital Signs Time Taken: 13:28 Temperature (F): 98.4 Height (in): 68 Pulse (bpm): 76 Weight (lbs): 257 Respiratory Rate (breaths/min): 16 Body Mass Index (BMI): 39.1 Blood Pressure (mmHg): 118/66 Reference Range: 80 - 120 mg / dl Electronic Signature(s) Signed: 03/30/2015 5:53:15 PM By: Regan Lemming BSN, RN Entered By: Regan Lemming on 03/30/2015 13:28:58  Goal Status: Active Interventions: Assess for signs and symptoms of osteomyelitis resolution every visit Provide education on osteomyelitis Treatment Activities: MRI : 03/30/2015 Test ordered outside of clinic : 03/30/2015 X-ray :  03/30/2015 Notes: Soft Tissue Infection Nursing Diagnoses: Knowledge deficit related to disease process and management Potential for infection: soft tissue Goals: Patient/caregiver will verbalize understanding of or measures to prevent infection and contamination in the home setting Date Initiated: 01/17/2015 Goal Status: Active Patient's soft tissue infection will resolve Date Initiated: 01/17/2015 Goal Status: Active Signs and symptoms of infection will be recognized early to allow for prompt treatment Date Initiated: 01/17/2015 Goal Status: Active Interventions: Assess signs and symptoms of infection every visit Provide education on infection Screen for HBO Treatment Activities: Culture : 03/30/2015 Culture and sensitivity : 03/30/2015 Systemic antibiotics : 03/30/2015 Test ordered outside of clinic : 03/30/2015 Notes: Wound/Skin Impairment [name], [name] ([id number]) Nursing Diagnoses: Impaired tissue integrity Knowledge deficit related to ulceration/compromised skin integrity Goals: Patient/caregiver will verbalize understanding of skin care regimen Date Initiated: 01/10/2015 Goal Status: Active Ulcer/skin breakdown will have a volume reduction of 30% by week 4 Date Initiated: 01/10/2015 Goal Status: Active Ulcer/skin breakdown will have a volume reduction of 50% by week 8 Date Initiated: 01/10/2015 Goal Status: Active Ulcer/skin breakdown will have a volume reduction of 80% by week 12 Date Initiated: 01/10/2015 Goal Status: Active Ulcer/skin breakdown will heal within 14 weeks Date Initiated: 01/10/2015 Goal Status: Active Interventions: Assess patient/caregiver ability to obtain necessary supplies Assess patient/caregiver ability to perform ulcer/skin care regimen upon admission and as needed Assess ulceration(s) every visit Provide education on smoking Provide education on ulcer and skin care Treatment Activities: Consult for HBO : 03/30/2015 Skin care regimen  initiated : 03/30/2015 Topical wound management initiated : 03/30/2015 Notes: Electronic Signature(s) Signed: 03/30/2015 5:53:15 PM By: Regan Lemming BSN, RN Entered By: Regan Lemming on 03/30/2015 13:34:39 [name] ([id number]) -------------------------------------------------------------------------------- Pain Assessment Details Patient Name: [name] Date of Service: 03/30/2015 1:00 PM Medical Record Number: [id number] Patient Account Number: [id number] Date of Birth/Sex: 10/22/1955 (59 y.o. Female) Treating RN: Baruch Gouty, RN, BSN, [place] Harman Primary Care Physician: [name] Other Clinician: Referring Physician: [name] Treating Physician/Extender: Frann Rider in Treatment: 11 Active Problems Location of Pain Severity and Description of Pain Patient Has Paino No Site Locations Pain Management and Medication Current Pain Management: Electronic Signature(s) Signed: 03/30/2015 5:53:15 PM By: Regan Lemming BSN, RN Entered By: Regan Lemming on 03/30/2015 13:29:07 [name] ([id number]) -------------------------------------------------------------------------------- Patient/Caregiver Education Details Patient Name: [name] Date of Service: 03/30/2015 1:00 PM Medical Record Number: [id number] Patient Account Number: [id number] Date of Birth/Gender: 1956/03/25 (59 y.o. Female) Treating RN: Baruch Gouty, RN, BSN, [place] Har[place] Primary Care Physician: [name] Other Clinician: Referring Physician: [name] Treating Physician/Extender: Frann Rider in Treatment: 11 Education Assessment Education Provided To: Patient Education Topics Provided Wound/Skin Impairment: Methods: Explain/Verbal Responses: State content correctly Electronic Signature(s) Signed: 03/30/2015 5:53:15 PM By: Regan Lemming BSN, RN Entered By: Regan Lemming on 03/30/2015 13:49:41 [name]  ([id number]) -------------------------------------------------------------------------------- Wound Assessment Details Patient Name: [name] Date of Service: 03/30/2015 1:00 PM Medical Record Number: [id number] Patient Account Number: [id number] Date of Birth/Sex: 1956-04-15 (59 y.o. Female) Treating RN: Baruch Gouty, RN, BSN, [place] Harman Primary Care Physician: [name] Other Clinician: Referring Physician: [name] Treating Physician/Extender: Frann Rider in Treatment: 11 Wound Status Wound Number: 1 Primary Diabetic Wound/Ulcer of the Lower Etiology: Extremity Wound Location: Left Calcaneous Wound Status: Open Wounding Event: Gradually Appeared Date  Referring Physician: [name] Treating Physician/Extender: Frann Rider in Treatment: 11 Wound Status Wound Number: 6 Primary Etiology: Pressure Ulcer Wound Location: Left, Medial Upper Leg Wound Status: Open Wounding Event: Pressure Injury Date Acquired: 02/21/2015 Weeks Of Treatment: 3 Clustered Wound: No Photos Photo Uploaded By: Regan Lemming on 03/30/2015 17:13:50 Wound Measurements Length: (cm) 0 Width: (cm) 0 Depth: (cm) 0 Area: (cm) 0 Volume: (cm) 0 % Reduction in Area: 100% % Reduction in Volume: 100% Wound Description Classification: Category/Stage III Periwound Skin  Texture Texture Color No Abnormalities Noted: No No Abnormalities Noted: No Moisture No Abnormalities Noted: No Electronic Signature(s) Signed: 03/30/2015 5:53:15 PM By: Regan Lemming BSN, RN [name] ([id number]) Entered By: Regan Lemming on 03/30/2015 13:27:48 [name] ([id number]) -------------------------------------------------------------------------------- Wound Assessment Details Patient Name: [name] Date of Service: 03/30/2015 1:00 PM Medical Record Number: [id number] Patient Account Number: [id number] Date of Birth/Sex: 20-Jul-1956 (59 y.o. Female) Treating RN: Afful, RN, BSN, [place] Primary Care Physician: [name] Other Clinician: Referring Physician: [name] Treating Physician/Extender: Frann Rider in Treatment: 11 Wound Status Wound Number: 7 Primary Diabetic Wound/Ulcer of the Lower Etiology: Extremity Wound Location: Right Toe Second Wound Status: Open Wounding Event: Gradually Appeared Date Acquired: 03/10/2015 Weeks Of Treatment: 2 Clustered Wound: No Photos Photo Uploaded By: Regan Lemming on 03/30/2015 17:13:50 Wound Measurements Length: (cm) 0.6 Width: (cm) 0.9 Depth: (cm) 0.1 Area: (cm) 0.424 Volume: (cm) 0.042 % Reduction in Area: 48.1% % Reduction in Volume: 48.8% Wound Description Classification: Grade 1 Periwound Skin Texture Texture Color No Abnormalities Noted: No No Abnormalities Noted: No Moisture No Abnormalities Noted: No Treatment Notes Wound #7 (Right Toe Second) 1. Cleansed with: [name] ([id number]) Clean wound with Normal Saline 3. Peri-wound Care: Skin Prep 4. Dressing Applied: Prisma Ag Other dressing (specify in notes) 5. Secondary Dressing Applied Bordered Foam Dressing Notes betadine paint to nose Electronic Signature(s) Signed: 03/30/2015 5:53:15 PM By: Regan Lemming BSN, RN Entered By: Regan Lemming on 03/30/2015 13:27:48 [name]  ([id number]) -------------------------------------------------------------------------------- [place] Details Patient Name: [name] Date of Service: 03/30/2015 1:00 PM Medical Record Number: [id number] Patient Account Number: [id number] Date of Birth/Sex: 1956/10/06 (59 y.o. Female) Treating RN: Afful, RN, BSN, [place] Primary Care Physician: [name] Other Clinician: Referring Physician: [name] Treating Physician/Extender: Frann Rider in Treatment: 11 Vital Signs Time Taken: 13:28 Temperature (F): 98.4 Height (in): 68 Pulse (bpm): 76 Weight (lbs): 257 Respiratory Rate (breaths/min): 16 Body Mass Index (BMI): 39.1 Blood Pressure (mmHg): 118/66 Reference Range: 80 - 120 mg / dl Electronic Signature(s) Signed: 03/30/2015 5:53:15 PM By: Regan Lemming BSN, RN Entered By: Regan Lemming on 03/30/2015 13:28:58

## 2015-03-31 NOTE — Progress Notes (Signed)
AVAH, SMETHURST ([id number]) Visit Report for 03/30/2015 Chief Complaint Document Details Patient Name: [name], [name] Date of Service: 03/30/2015 1:00 PM Medical Record Number: [id number] Patient Account Number: [id number] Date of Birth/Sex: September 10, 1956 (59 y.o. Female) Treating RN: Primary Care Physician: [name] Other Clinician: Referring Physician: [name] Treating Physician/Extender: Frann Rider in Treatment: 11 Information Obtained from: Patient Chief Complaint Patient presents to the wound care center for a consult due non healing wound 59 year old patient who comes with a history of an ulcer on the left second toe for at least about 4 months and a history on her left heel for about 3 weeks. I needed to get an MRI of her left foot but due to her defibrillator this could not be done and hence she is getting get a triple phase bone scan. 01/17/2015 -- she has developed a significant injury to the bridge of the nose where her CPAP machine was fitting. This is a painfull dark spot. Electronic Signature(s) Signed: 03/30/2015 5:00:54 PM By: Christin Fudge MD, FACS Entered By: Christin Fudge on 03/30/2015 13:46:10 [name] ([id number]) -------------------------------------------------------------------------------- Debridement Details Patient Name: [name] Date of Service: 03/30/2015 1:00 PM Medical Record Number: [id number] Patient Account Number: [id number] Date of Birth/Sex: 11-06-1955 (59 y.o. Female) Treating RN: Primary Care Physician: [name] Other Clinician: Referring Physician: [name] Treating Physician/Extender: Frann Rider in Treatment: 11 Debridement Performed for Wound #1 Left Calcaneous Assessment: Performed By: Physician Pat Patrick., MD Debridement: Debridement Pre-procedure Yes Verification/Time Out Taken: Start Time: 13:36 Pain Control: Lidocaine 4% Topical Solution Level: Skin/Subcutaneous  Tissue Total Area Debrided (L x 0.5 (cm) x 0.5 (cm) = 0.25 (cm) W): Tissue and other Viable, Non-Viable, Eschar, Exudate, Fibrin/Slough, Subcutaneous material debrided: Instrument: Curette Bleeding: Minimum Hemostasis Achieved: Pressure End Time: 13:38 Procedural Pain: 0 Post Procedural Pain: 0 Response to Treatment: Procedure was tolerated well Post Debridement Measurements of Total Wound Length: (cm) 0.5 Width: (cm) 0.5 Depth: (cm) 0.1 Volume: (cm) 0.02 Electronic Signature(s) Signed: 03/30/2015 5:00:54 PM By: Christin Fudge MD, FACS Entered By: Christin Fudge on 03/30/2015 13:46:02 [name] ([id number]) -------------------------------------------------------------------------------- HPI Details Patient Name: [name] Date of Service: 03/30/2015 1:00 PM Medical Record Number: [id number] Patient Account Number: [id number] Date of Birth/Sex: [birth year] (59 y.o. Female) Treating RN: Primary Care Physician: [name] Other Clinician: Referring Physician: [name] Treating Physician/Extender: Frann Rider in Treatment: 11 History of Present Illness HPI Description: 59 year old patient who is known to have diabetes for several years and generally has it under good control last hemoglobin A1c was 6.7 done 2 weeks ago. She has several other comorbidities including acute CHF, hypoxia, hyperlipidemia, systolic heart failure, myocardial infarction, coronary artery disease, iron deficiency anemia, renal insufficiency, essential hypertension, morbid obesity and obstructive sleep apnea. 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 In the past she's had a coronary angioplasty with stent in 2013, laparoscopic cholecystectomy in 2011, bilateral oophorectomy in April 2012 and she's had some lumbar disc surgery in 1996. She says she's had a lot of peripheral neuropathy and due to this she's had a ulcer on her  left second toe for a while. She had a dry eschar on her left heel and recently this was removed and she found that she had some infection and some drainage from there. She has minimal pain around the heel but no pain on any areas of her toes. she has not been on any antibiotics recently and  has known vascular problems in the recent past. 01/17/2015 left foot x-ray done on April 5 reveals there are destructive changes noted of the distal aspect of the proximal phalanx of the left second digit with extension into the adjacent proximal interphalangeal joint space. These findings suggest osteomyelitis with possible septic arthritis. I was able to make a call to Dr. Nevada Crane the radiologist, and had a detailed discussion with him regarding this x-ray. He noted that there were also some changes in the fifth phalanx on the left foot and this could have been chronic osteomyelitis too. The second toe on the left leg shows findings which cannot differentiate between an acute or chronic osteomyelitis and hence he has recommended a MRI with and without contrast. We will get this organized for her and in the meanwhile treat her with oral antibiotics which is susceptible to Bactrim. She was seen yesterday by her PCP Dr. Chaya Jan who put her on 3 antibiotics which include Levaquin and doxycycline and Bactrim. 01/24/2015 She is scheduled for a triple PHASE bone scan on 01/26/2015. She is still on antibiotics and otherwise doing well. 01/31/2015 Bone Imaging 3 phase study -- IMPRESSION:1. There is increased uptake on all 3 phases localizing to the left hindfoot region. This may be a manifestation of osteomyelitis. Correlation with exact site of nonhealing ulcer. 2. There is increased uptake localizing to the second phalanx of the left foot on the blood pool and delayed phase images. 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 Underlying infection not excluded. I  have personally discussed the x-ray studies with the radiologist Dr. Kerby Moors and after comparing all her films he agrees that the left second phalanx does not have any definite underlying infection and this is [name], [name]. ([id number]) now off a chronic arthropathy. There is a increased uptake in all 3 phases localized to the left hindfoot region and this is a manifestation of osteomyelitis. 02/07/2015 -- She was seen by the ID specialist Dr. Ola Spurr who reviewed her on my request recently. He has recommended a PICC line and IV vancomycin and oral ciprofloxacin for 4 weeks. She will be reviewed by him periodically. He also received several notes from Dr. Sonia Baller to help as documented all the requisites for hyperbaric oxygen therapy. 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 He also had a coronary angioplasty with stent placement in October 2013. Prior to that her 2-D echo showed EF of 35%. Her latest 2-D echo done on 11/01/2014 showed a LV EF of 50-55%. Systolic function and wall motion were within normal limits. Chest x-ray done in January 16, 2015 showed no acute findings. Last hemoglobin A1c done on 12/30/2014 was 6.7. CBC done on the same day was within normal limits, except for a hemoglobin being 8.7 and a hematocrit between 29%. She had arterial duplex study done in August 2015 which showed: No evidence of segmental lower extremity arterial disease at rest, bilaterally. Normal ABI's, bilaterally. Normal great toe pressures, bilaterally. With all the above in place I believe she is a very good candidate for hyperbaric oxygen therapy and I would recommend this with the usual protocol for 6 weeks. she is going to get her PICC line this Friday and the IV antibiotics and when to start. She is also starting on oral ciprofloxacin. 03/03/2015 - recently hospitalized for anemia. No source identified.  Started on amiodarone, contraindication to HBO. Reports left heel  feels better. Main complaint is new buttocks ulcers. No fever or chills. Minimal drainage. 03/10/2015 -- There is a new ulceration noticed yesterday on her right second toe at the tip. She also has noticed some ulcerations on her gluteal region and we will need to take a look at this. As far as her medications go she does not know for sure what her dosage of amiodarone is exactly. She will look it up and let me know today. 03/17/2015 -- as far as her dosage of amiodarone she was taking : 02/25/15 - 03/02/15 400 mg per day; 03/03/15 - 03/10/15 200 mg per day. She stopped taking the medication on 03/10/15. She has not yet done the checks x-ray and her PFTs are still pending. Recent Lab work done on 03/13/2015 shows a glucose is 124, K+ was 3.3, her albumen was 2.9, WBC count was 3.4, hemoglobin was 9.3, hematocrit was 30.4 and platelets were 240. C-reactive protein was 33.7 which was high and the Vancomycin trough was 18.6 which was high. 03/24/2015 -- he saw several doctors since I saw her last and they include her medical oncologist, cardiologist, neurologist and infectious disease doctor. Her PICC line is out and she now is on oral Cipro and doxycycline as per her verbal report. She has completed her pulmonary function test yesterday and a recent chest x-ray is within normal limits. If the PFTs within normal limits she will be able to restart the hyperbaric oxygen therapy. 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 Underlying interstitial lung disease to be considered. Further recommendations were that of a CT chest and pulmonary consult if indicated. Electronic Signature(s) Signed: 03/30/2015 5:00:54 PM By: Christin Fudge MD, FACS [name] ([id number]) Entered By: Christin Fudge on 03/30/2015  15:14:07 [name] ([id number]) -------------------------------------------------------------------------------- Physical Exam Details Patient Name: [name] Date of Service: 03/30/2015 1:00 PM Medical Record Number: [id number] Patient Account Number: [id number] Date of Birth/Sex: 1955-11-23 (59 y.o. Female) Treating RN: Primary Care Physician: [name] Other Clinician: Referring Physician: [name] Treating Physician/Extender: Frann Rider in Treatment: 11 Constitutional . Pulse regular. Respirations normal and unlabored. Afebrile. . Eyes Nonicteric. Reactive to light. Ears, Nose, Mouth, and Throat Lips, teeth, and gums WNL.Marland Kitchen Moist mucosa without lesions . Neck supple and nontender. No palpable supraclavicular or cervical adenopathy. Normal sized without goiter. Respiratory WNL. No retractions.. Cardiovascular Pedal Pulses WNL. No clubbing, cyanosis or edema. Musculoskeletal Adexa without tenderness or enlargement.. Digits and nails w/o clubbing, cyanosis, infection, petechiae, ischemia, or inflammatory conditions.. Integumentary (Hair, Skin) her wounds are looking good and the one on the left calcaneum is looking much better. The gluteal wound is also very clean and we will use Prisma on all wounds.. No crepitus or fluctuance. No peri-wound warmth or erythema. No masses.Marland Kitchen Psychiatric Judgement and insight Intact.. No evidence of depression, anxiety, or agitation.. Electronic Signature(s) Signed: 03/30/2015 5:00:54 PM By: Christin Fudge MD, FACS Entered By: Christin Fudge on 03/30/2015 13:47:53 [name] ([id number]) -------------------------------------------------------------------------------- Physician Orders Details Patient Name: [name] Date of Service: 03/30/2015 1:00 PM Medical Record Number: [id number] Patient Account Number: [id number] Date of Birth/Sex: 29-Aug-1956 (59 y.o. Female) Treating RN: Baruch Gouty, RN, BSN,  Velva Harman Primary Care Physician: [name] Other Clinician: Referring Physician: [name] Treating Physician/Extender: Frann Rider in Treatment: 52 Verbal / Phone Orders: Yes Clinician: Afful, RN, BSN, Rita Read Back and Verified: Yes Diagnosis Coding  Wound Cleansing Wound #1 Left Calcaneous o Clean wound with Normal Saline. o May Shower, gently pat wound dry prior to applying new dressing. o May shower with protection. Wound #3 Nose o Clean wound with Normal Saline. o May Shower, gently pat wound dry prior to applying new dressing. o May shower with protection. Wound #4 Medial Coccyx o Clean wound with Normal Saline. o May Shower, gently pat wound dry prior to applying new dressing. o May shower with protection. Wound #6 Left,Medial Upper Leg o Clean wound with Normal Saline. o May Shower, gently pat wound dry prior to applying new dressing. o May shower with protection. Wound #7 Right Toe Second o Clean wound with Normal Saline. o May Shower, gently pat wound dry prior to applying new dressing. o May shower with protection. Anesthetic Wound #1 Left Calcaneous o Topical Lidocaine 4% cream applied to wound bed prior to debridement Wound #3 Nose o Topical Lidocaine 4% cream applied to wound bed prior to debridement Wound #4 Medial Coccyx o Topical Lidocaine 4% cream applied to wound bed prior to debridement Wound #6 Left,Medial Upper Leg [name], [name] ([id number]) o Topical Lidocaine 4% cream applied to wound bed prior to debridement Wound #7 Right Toe Second o Topical Lidocaine 4% cream applied to wound bed prior to debridement Primary Wound Dressing Wound #1 Left Calcaneous o Prisma Ag Wound #3 Nose o Other: - betadine paint Wound #4 Medial Coccyx o Prisma Ag Wound #6 Left,Medial Upper Leg o Prisma Ag Wound #7 Right Toe Second o Prisma Ag Secondary Dressing Wound #1 Left Calcaneous o Gauze  and Kerlix/Conform Wound #3 Nose o Gauze and Kerlix/Conform Wound #4 Medial Coccyx o Boardered Foam Dressing Wound #6 Left,Medial Upper Leg o Boardered Foam Dressing Wound #7 Right Toe Second o Gauze and Kerlix/Conform Dressing Change Frequency Wound #1 Left Calcaneous o Change dressing every other day. Wound #3 Nose o Change dressing every other day. Wound #4 Medial Coccyx o Change dressing every other day. Wound #6 Left,Medial Upper Leg [name], [name]. ([id number]) o Change dressing every other day. Wound #7 Right Toe Second o Change dressing every other day. Follow-up Appointments Wound #1 Left Calcaneous o Return Appointment in 1 week. Wound #3 Nose o Return Appointment in 1 week. Wound #4 Medial Coccyx o Return Appointment in 1 week. Wound #6 Left,Medial Upper Leg o Return Appointment in 1 week. Wound #7 Right Toe Second o Return Appointment in 1 week. Off-Loading Wound #1 Left Calcaneous o Open toe surgical shoe with peg assist. o Heel suspension boot to: - Sage boots Wound #4 Medial Coccyx o Turn and reposition every 2 hours Wound #6 Left,Medial Upper Leg o Turn and reposition every 2 hours Wound #7 Right Toe Second o Heel suspension boot to: - Sage boots Additional Orders / Instructions Wound #1 Left Calcaneous o Increase protein intake. o Activity as tolerated Wound #3 Nose o Increase protein intake. o Activity as tolerated Wound #4 Medial Coccyx o Increase protein intake. o Activity as tolerated [name], [name]. ([id number]) Wound #6 Left,Medial Upper Leg o Increase protein intake. o Activity as tolerated Wound #7 Right Toe Second o Increase protein intake. o Activity as tolerated Home Health Wound #1 Left [place] Nurse may visit PRN to address patientos wound care needs. o FACE TO FACE ENCOUNTER: MEDICARE and MEDICAID PATIENTS: I  certify that this patient is under my care and that I had a face-to-face encounter that meets the physician  face-to-face encounter requirements with this patient on this date. The encounter with the patient was in whole or in part for the following MEDICAL CONDITION: (primary reason for [place]) MEDICAL NECESSITY: I certify, that based on my findings, NURSING services are a medically necessary home health service. HOME BOUND STATUS: I certify that my clinical findings support that this patient is homebound (i.e., Due to illness or injury, pt requires aid of supportive devices such as crutches, cane, wheelchairs, walkers, the use of special transportation or the assistance of another person to leave their place of residence. There is a normal inability to leave the home and doing so requires considerable and taxing effort. Other absences are for medical reasons / religious services and are infrequent or of short duration when for other reasons). o If current dressing causes regression in wound condition, may D/C ordered dressing product/s and apply Normal Saline Moist Dressing daily until next [place] / Other MD appointment. [place] of regression in wound condition at [phone]. o Please direct any NON-WOUND related issues/requests for orders to patient's Primary Care Physician Wound #3 [place] Nurse may visit PRN to address patientos wound care needs. o FACE TO FACE ENCOUNTER: MEDICARE and MEDICAID PATIENTS: I certify that this patient is under my care and that I had a face-to-face encounter that meets the physician face-to-face encounter requirements with this patient on this date. The encounter with the patient was in whole or in part for the following MEDICAL CONDITION: (primary reason for [place]) MEDICAL NECESSITY: I certify, that based on my findings, NURSING services are a  medically necessary home health service. HOME BOUND STATUS: I certify that my clinical findings support that this patient is homebound (i.e., Due to illness or injury, pt requires aid of supportive devices such as crutches, cane, wheelchairs, walkers, the use of special transportation or the assistance of another person to leave their place of residence. There is a normal inability to leave the home and doing so requires considerable and taxing effort. Other absences are for medical reasons / religious services and are infrequent or of short duration when for other reasons). o If current dressing causes regression in wound condition, may D/C ordered dressing product/s and apply Normal Saline Moist Dressing daily until next [place] / Other MD appointment. [place] of regression in wound condition at [phone]. [name], [name] ([id number]) o Please direct any NON-WOUND related issues/requests for orders to patient's Primary Care Physician Wound #4 [place] Nurse may visit PRN to address patientos wound care needs. o FACE TO FACE ENCOUNTER: MEDICARE and MEDICAID PATIENTS: I certify that this patient is under my care and that I had a face-to-face encounter that meets the physician face-to-face encounter requirements with this patient on this date. The encounter with the patient was in whole or in part for the following MEDICAL CONDITION: (primary reason for [place]) MEDICAL NECESSITY: I certify, that based on my findings, NURSING services are a medically necessary home health service. HOME BOUND STATUS: I certify that my clinical findings support that this patient is homebound (i.e., Due to illness or injury, pt requires aid of supportive devices such as crutches, cane, wheelchairs, walkers, the use of special transportation or the assistance of another person to leave their place of  residence. There is a normal inability to leave the home and doing so requires considerable and  taxing effort. Other absences are for medical reasons / religious services and are infrequent or of short duration when for other reasons). o If current dressing causes regression in wound condition, may D/C ordered dressing product/s and apply Normal Saline Moist Dressing daily until next [place] / Other MD appointment. [place] of regression in wound condition at [phone]. o Please direct any NON-WOUND related issues/requests for orders to patient's Primary Care Physician Wound #6 Left,Medial Upper Leg o [place] Visits o Home Health Nurse may visit PRN to address patientos wound care needs. o FACE TO FACE ENCOUNTER: MEDICARE and MEDICAID PATIENTS: I certify that this patient is under my care and that I had a face-to-face encounter that meets the physician face-to-face encounter requirements with this patient on this date. The encounter with the patient was in whole or in part for the following MEDICAL CONDITION: (primary reason for [place]) MEDICAL NECESSITY: I certify, that based on my findings, NURSING services are a medically necessary home health service. HOME BOUND STATUS: I certify that my clinical findings support that this patient is homebound (i.e., Due to illness or injury, pt requires aid of supportive devices such as crutches, cane, wheelchairs, walkers, the use of special transportation or the assistance of another person to leave their place of residence. There is a normal inability to leave the home and doing so requires considerable and taxing effort. Other absences are for medical reasons / religious services and are infrequent or of short duration when for other reasons). o If current dressing causes regression in wound condition, may D/C ordered dressing product/s and apply Normal Saline Moist Dressing daily  until next [place] / Other MD appointment. [place] of regression in wound condition at [phone]. o Please direct any NON-WOUND related issues/requests for orders to patient's Primary Care Physician Wound #7 Right Toe Second o [place] Nurse may visit PRN to address patientos wound care needs. o FACE TO FACE ENCOUNTER: MEDICARE and MEDICAID PATIENTS: I certify that this patient is under my care and that I had a face-to-face encounter that meets the physician face-to-face [name], [name] ([id number]) encounter requirements with this patient on this date. The encounter with the patient was in whole or in part for the following MEDICAL CONDITION: (primary reason for [place]) MEDICAL NECESSITY: I certify, that based on my findings, NURSING services are a medically necessary home health service. HOME BOUND STATUS: I certify that my clinical findings support that this patient is homebound (i.e., Due to illness or injury, pt requires aid of supportive devices such as crutches, cane, wheelchairs, walkers, the use of special transportation or the assistance of another person to leave their place of residence. There is a normal inability to leave the home and doing so requires considerable and taxing effort. Other absences are for medical reasons / religious services and are infrequent or of short duration when for other reasons). o If current dressing causes regression in wound condition, may D/C ordered dressing product/s and apply Normal Saline Moist Dressing daily until next [place] / Other MD appointment. [place] of regression in wound condition at [phone]. o Please direct any NON-WOUND related issues/requests for orders to patient's Primary Care Physician Hyperbaric Oxygen Therapy Wound #1 Left Calcaneous o Indication: - osteomyelitis o If appropriate for  treatment, begin HBOT per protocol: o 2.0 ATA for 90 Minutes without Air Breaks o One treatment per  day (delivered Monday through Friday unless otherwise specified in Special Instructions below): o Total # of Treatments: - 40 o Finger stick Blood Glucose Pre- and Post- HBOT Treatment. o Follow Hyperbaric Oxygen Glycemia Protocol HBO Contraindications Wound #1 Left Calcaneous - Left Lower Extremity o HBO contraindications of hyperbaric oxygen therapy were reviewed and the patient found to have no untreated pneumothorax or history of spontaneous pneumothorax. o HBO contraindications of hyperbaric oxygen therapy were reviewed and the patient found to have no history of medications such as Bleomycin, Adriamycin, disulfiram, cisplatin and sulfamylon and is not currently receiving any chemotherapy. o HBO contraindications of hyperbaric oxygen therapy were reviewed and the patient found to have no Upper respiratory infection and chronic sinusitis. o HBO contraindications of hyperbaric oxygen therapy were reviewed and the patient found to have no history of retinal surgery proceeding 6 weeks or intraocular gas o HBO contraindications of hyperbaric oxygen therapy were reviewed and the patient found to have no history seizure disorder or any anticonvulsant medication. o HBO contraindications of hyperbaric oxygen therapy were reviewed and the patient found to have no septicemia with CO2 retention. o HBO contraindications of hyperbaric oxygen therapy were reviewed and the patient found to have no fever greater than 100 degrees. o HBO contraindications of hyperbaric oxygen therapy were reviewed and the patient found to have no pregnancy noted. o HBO contraindications of hyperbaric oxygen therapy were reviewed and the patient found to have no medications such as steroids or narcotics or Phenergan. JAQUISHA, ACEVES ([id number]) Medications-please add to medication  list. Wound #1 Left Calcaneous - Left Lower Extremity o P.O. Antibiotics - continue Cipro and Vancomycin as prescribed by Dr. Littie Deeds INTERVENTIONS PROTOCOL PRE-HBO GLYCEMIA INTERVENTIONS ACTION INTERVENTION Obtain pre-HBO capillary blood 1 glucose (ensure physician order is in chart). A. Notify HBO physician and await physician orders. 2 If result is 70 mg/dl or below: B. If the result meets the hospital definition of a critical result, follow hospital policy. A. Give patient an 8 ounce Glucerna Shake, an 8 ounce Ensure, or 8 ounces of a Glucerna/Ensure equivalent dietary supplement*. B. Wait 30 minutes. If result is 71 mg/dl to 130 mg/dl: C. Retest patientos capillary blood glucose (CBG). D. If result greater than or equal to 110 mg/dl, proceed with HBO. If result less than 110 mg/dl, notify HBO physician and consider holding HBO. If result is 131 mg/dl to 249 mg/dl: A. Proceed with HBO. A. Notify HBO physician and await physician orders. B. It is recommended to hold HBO and do blood/urine ketone If result is 250 mg/dl or greater: testing. C. If the result meets the hospital definition of a critical result, follow hospital policy. POST-HBO GLYCEMIA INTERVENTIONS ACTION INTERVENTION Obtain post HBO capillary blood 1 glucose (ensure physician order is in chart). A. Notify HBO physician and await physician orders. 2 If result is 70 mg/dl or below: B. If the result meets the hospital definition of a critical result, follow hospital policy. If result is 71 mg/dl to 100 mg/dl: JARRELL, MANZANARES. ([id number]) A. Give patient an 8 ounce Glucerna Shake, an 8 ounce Ensure, or 8 ounces of a Glucerna/Ensure equivalent dietary supplement*. B. Wait 15 minutes for symptoms of hypoglycemia (i.e. nervousness, anxiety, sweating, chills, clamminess, irritability, confusion, tachycardia or dizziness). C. If patient asymptomatic, discharge patient. If  patient symptomatic, repeat capillary blood glucose (CBG) and notify HBO physician. If result is 101 mg/dl to 249 mg/dl: A. Discharge patient. A. Notify HBO physician and await physician orders. B. It is  recommended to do If result is 250 mg/dl or greater: blood/urine ketone testing. C. If the result meets the hospital definition of a critical result, follow hospital policy. *Juice or candies are NOT equivalent products. If patient refuses the Glucerna or Ensure, please consult the hospital dietitian for an appropriate substitute. Electronic Signature(s) Signed: 03/30/2015 5:00:54 PM By: Christin Fudge MD, FACS Signed: 03/30/2015 5:53:15 PM By: Regan Lemming BSN, RN Entered By: Regan Lemming on 03/30/2015 13:37:30 [name] ([id number]) -------------------------------------------------------------------------------- Problem List Details Patient Name: [name] Date of Service: 03/30/2015 1:00 PM Medical Record Number: [id number] Patient Account Number: [id number] Date of Birth/Sex: 1956/07/08 (59 y.o. 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 01/10/2015 Yes layer exposed L97.522 Non-pressure chronic ulcer of other part of left foot with fat 01/10/2015 Yes layer exposed [id number] Chronic systolic (congestive) heart failure 01/10/2015 Yes E11.42 Type 2 diabetes mellitus with diabetic polyneuropathy 01/10/2015 Yes S00.33XA Contusion of nose, initial encounter 01/17/2015 Yes 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, left ankle and foot 02/13/2015 Yes 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, stage 3 03/03/2015 Yes L89.323 Pressure ulcer of left  buttock, stage 3 03/03/2015 Yes L97.512 Non-pressure chronic ulcer of other part of right foot with 03/10/2015 Yes fat layer exposed KAIAH, BOULOS ([id number]) Inactive Problems Resolved Problems Electronic Signature(s) Signed: 03/30/2015 5:00:54 PM By: Christin Fudge MD, FACS Entered By: Christin Fudge on 03/30/2015 13:45:41 [name] ([id number]) -------------------------------------------------------------------------------- Progress Note Details Patient Name: [name] Date of Service: 03/30/2015 1:00 PM Medical Record Number: [id number] Patient Account Number: [id number] Date of Birth/Sex: September 27, 1956 (59 y.o. Female) Treating RN: Primary Care Physician: [name] Other Clinician: Referring Physician: [name] Treating Physician/Extender: Frann Rider in Treatment: 11 Subjective Chief Complaint Information obtained from Patient Patient presents to the wound care center for a consult due non healing wound 59 year old patient who comes with a history of an ulcer on the left second toe for at least about 4 months and a history on her left heel for about 3 weeks. I needed to get an MRI of her left foot but due to her defibrillator this could not be done and hence she is getting get a triple phase bone scan. 01/17/2015 -- she has developed a significant injury to the bridge of the nose where her CPAP machine was fitting. This is a painfull dark spot. History of Present Illness (HPI) 59 year old patient who is known to have diabetes for several years and generally has it under good control last hemoglobin A1c was 6.7 done 2 weeks ago. She has several other comorbidities including acute CHF, hypoxia, hyperlipidemia, systolic heart failure, myocardial infarction, coronary artery disease, iron deficiency anemia, renal insufficiency, essential hypertension, morbid obesity and obstructive sleep apnea. She also has a history of a implantable cardioverter  defibrillator placed in September 2015. In the past she's had a coronary angioplasty with stent in 2013, laparoscopic cholecystectomy in 2011, bilateral oophorectomy in April 2012 and she's had some lumbar disc surgery in 1996. She says she's had a lot of peripheral neuropathy and due to this she's had a ulcer on her left second toe for a while. She had a dry eschar on her left heel and recently this was removed and she found that she had  some infection and some drainage from there. She has minimal pain around the heel but no pain on any areas of her toes. she has not been on any antibiotics recently and has known vascular problems in the recent past. 01/17/2015 left foot x-ray done on April 5 reveals there are destructive changes noted of the distal aspect of the proximal phalanx of the left second digit with extension into the adjacent proximal interphalangeal joint space. These findings suggest osteomyelitis with possible septic arthritis. I was able to make a call to Dr. Nevada Crane the radiologist, and had a detailed discussion with him regarding this x-ray. He noted that there were also some changes in the fifth phalanx on the left foot and this could have been chronic osteomyelitis too. The second toe on the left leg shows findings which cannot differentiate between an acute or chronic osteomyelitis and hence he has recommended a MRI with and without contrast. We will get this organized for her and in the meanwhile treat her with oral antibiotics which is susceptible to Bactrim. She was seen yesterday by her PCP Dr. Chaya Jan who put her on 3 antibiotics which include Levaquin and doxycycline and Bactrim. 01/24/2015 She is scheduled for a triple PHASE bone scan on 01/26/2015. She is still on antibiotics and otherwise doing well. [name], [name] ([id number]) 01/31/2015 Bone Imaging 3 phase study -- IMPRESSION:1. There is increased uptake on all 3 phases localizing to the left hindfoot  region. This may be a manifestation of osteomyelitis. Correlation with exact site of nonhealing ulcer. 2. There is increased uptake localizing to the second phalanx of the left foot on the blood pool and delayed phase images. 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 Underlying infection not excluded. I have personally discussed the x-ray studies with the radiologist Dr. Kerby Moors and after comparing all her films he agrees that the left second phalanx does not have any definite underlying infection and this is now off a chronic arthropathy. There is a increased uptake in all 3 phases localized to the left hindfoot region and this is a manifestation of osteomyelitis. 02/07/2015 -- She was seen by the ID specialist Dr. Ola Spurr who reviewed her on my request recently. He has recommended a PICC line and IV vancomycin and oral ciprofloxacin for 4 weeks. She will be reviewed by him periodically. He also received several notes from Dr. Sonia Baller to help as documented all the requisites for hyperbaric oxygen therapy. 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 He also had a coronary angioplasty with stent placement in October 2013. Prior to that her 2-D echo showed EF of 35%. Her latest 2-D echo done on 11/01/2014 showed a LV EF of 50-55%. Systolic function and wall motion were within normal limits. Chest x-ray done in January 16, 2015 showed no acute findings. Last hemoglobin A1c done on 12/30/2014 was 6.7. CBC done on the same day was within normal limits, except for a hemoglobin being 8.7 and a hematocrit between 29%. She had arterial duplex study done in August 2015 which showed: No evidence of segmental lower extremity arterial disease at rest, bilaterally. Normal ABI's, bilaterally. Normal great toe pressures, bilaterally. With all the above in place I believe she is a  very good candidate for hyperbaric oxygen therapy and I would recommend this with the usual protocol for 6 weeks. she is going to get her PICC line this Friday and  the IV antibiotics and when to start. She is also starting on oral ciprofloxacin. 03/03/2015 - recently hospitalized for anemia. No source identified. Started on amiodarone, contraindication to HBO. Reports left heel feels better. Main complaint is new buttocks ulcers. No fever or chills. Minimal drainage. 03/10/2015 -- There is a new ulceration noticed yesterday on her right second toe at the tip. She also has noticed some ulcerations on her gluteal region and we will need to take a look at this. As far as her medications go she does not know for sure what her dosage of amiodarone is exactly. She will look it up and let me know today. 03/17/2015 -- as far as her dosage of amiodarone she was taking : 02/25/15 - 03/02/15 400 mg per day; 03/03/15 - 03/10/15 200 mg per day. She stopped taking the medication on 03/10/15. She has not yet done the checks x-ray and her PFTs are still pending. Recent Lab work done on 03/13/2015 shows a glucose is 124, K+ was 3.3, her albumen was 2.9, WBC count was 3.4, hemoglobin was 9.3, hematocrit was 30.4 and platelets were 240. C-reactive protein was 33.7 which was high and the Vancomycin trough was 18.6 which was high. 03/24/2015 -- he saw several doctors since I saw her last and they include her medical oncologist, cardiologist, neurologist and infectious disease doctor. Her PICC line is out and she now is on oral Cipro and doxycycline as per her verbal report. She has completed her pulmonary function test yesterday and a [name], [name]. ([id number]) recent chest x-ray is within normal limits. If the PFTs within normal limits she will be able to restart the hyperbaric oxygen therapy. 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 Underlying interstitial lung disease to be considered. Further recommendations were that of a CT chest and pulmonary consult if indicated. Objective Constitutional Pulse regular. Respirations normal and unlabored. Afebrile. Vitals Time Taken: 1:28 PM, Height: 68 in, Weight: 257 lbs, BMI: 39.1, Temperature: 98.4 F, Pulse: 76 bpm, Respiratory Rate: 16 breaths/min, Blood Pressure: 118/66 mmHg. Eyes Nonicteric. Reactive to light. Ears, Nose, Mouth, and Throat Lips, teeth, and gums WNL.Marland Kitchen Moist mucosa without lesions . Neck supple and nontender. No palpable supraclavicular or cervical adenopathy. Normal sized without goiter. Respiratory WNL. No retractions.. Cardiovascular Pedal Pulses WNL. No clubbing, cyanosis or edema. Musculoskeletal Adexa without tenderness or enlargement.. Digits and nails w/o clubbing, cyanosis, infection, petechiae, ischemia, or inflammatory conditions.Marland Kitchen Psychiatric Judgement and insight Intact.. No evidence of depression, anxiety, or agitation.. Integumentary (Hair, Skin) her wounds are looking good and the one on the left calcaneum is looking much better. The gluteal wound is also very clean and we will use Prisma on all wounds.. No crepitus or fluctuance. No peri-wound warmth or erythema. No masses.Marland Kitchen [name], [name] ([id number]) Wound #1 status is Open. Original cause of wound was Gradually Appeared. The wound is located on the Left Calcaneous. The wound measures 0.5cm length x 0.5cm width x 0.1cm depth; 0.196cm^2 area and 0.02cm^3 volume. Wound #3 status is Open. Original cause of wound was Pressure Injury. The wound is located on the Nose. The wound measures 1cm length x 1cm width x 0.1cm depth; 0.785cm^2 area and 0.079cm^3 volume. Wound #4 status is Open. Original cause of wound was Pressure Injury. The wound is located on the Medial Coccyx. The wound measures 2cm length x 1.3cm width x 0.1cm depth; 2.042cm^2 area  and  0.204cm^3 volume. Wound #6 status is Open. Original cause of wound was Pressure Injury. The wound is located on the Left,Medial Upper Leg. The wound measures 0cm length x 0cm width x 0cm depth; 0cm^2 area and 0cm^3 volume. Wound #7 status is Open. Original cause of wound was Gradually Appeared. The wound is located on the Right Toe Second. The wound measures 0.6cm length x 0.9cm width x 0.1cm depth; 0.424cm^2 area and 0.042cm^3 volume. 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 S00.33XA - Contusion of nose, initial encounter M86.372 - Chronic multifocal osteomyelitis, left ankle and foot L89.313 - Pressure ulcer of right buttock, stage 3 L89.323 - Pressure ulcer of left buttock, stage 3 L97.512 - Non-pressure chronic ulcer of other part of right foot with fat layer exposed Her wounds are looking good and the one on the left calcaneum is looking much better. The gluteal wound is also very clean and we will use Prisma on all wounds. We have awaiting her PFT results yet and as soon as we get these reports back we will restart her on hyperbaric oxygen therapy. KALLEIGH, BARBARITO ([id number]) Procedures Wound #1 Wound #1 is a Diabetic Wound/Ulcer of the Lower Extremity located on the Left Calcaneous . There was a Skin/Subcutaneous Tissue Debridement [id number]) debridement with total area of 0.25 sq cm performed by Pat Patrick., MD. with the following instrument(s): Curette to remove Viable and Non-Viable tissue/material including Exudate, Fibrin/Slough, Eschar, and Subcutaneous after achieving pain control using Lidocaine 4% Topical Solution. A time out  was conducted prior to the start of the procedure. A Minimum amount of bleeding was controlled with Pressure. The procedure was tolerated well with a pain level of 0 throughout and a pain level of 0 following the procedure. Post Debridement Measurements: 0.5cm length x 0.5cm width x 0.1cm depth; 0.02cm^3 volume. Plan Wound Cleansing: Wound #1 Left Calcaneous: Clean wound with Normal Saline. May Shower, gently pat wound dry prior to applying new dressing. May shower with protection. Wound #3 Nose: Clean wound with Normal Saline. May Shower, gently pat wound dry prior to applying new dressing. May shower with protection. Wound #4 Medial Coccyx: Clean wound with Normal Saline. May Shower, gently pat wound dry prior to applying new dressing. May shower with protection. Wound #6 Left,Medial Upper Leg: Clean wound with Normal Saline. May Shower, gently pat wound dry prior to applying new dressing. May shower with protection. Wound #7 Right Toe Second: Clean wound with Normal Saline. May Shower, gently pat wound dry prior to applying new dressing. May shower with protection. Anesthetic: Wound #1 Left Calcaneous: Topical Lidocaine 4% cream applied to wound bed prior to debridement Wound #3 Nose: Topical Lidocaine 4% cream applied to wound bed prior to debridement Wound #4 Medial Coccyx: Topical Lidocaine 4% cream applied to wound bed prior to debridement Wound #6 Left,Medial Upper Leg: Topical Lidocaine 4% cream applied to wound bed prior to debridement Wound #7 Right Toe Second: [name] ([id number]) Topical Lidocaine 4% cream applied to wound bed prior to debridement Primary Wound Dressing: Wound #1 Left Calcaneous: Prisma Ag Wound #3 Nose: Other: - betadine paint Wound #4 Medial Coccyx: Prisma Ag Wound #6 Left,Medial Upper Leg: Prisma Ag Wound #7  Right Toe Second: Prisma Ag Secondary Dressing: Wound #1 Left Calcaneous: Gauze and Kerlix/Conform Wound #3  Nose: Gauze and Kerlix/Conform Wound #4 Medial Coccyx: Boardered Foam Dressing Wound #6 Left,Medial Upper Leg: Boardered Foam Dressing Wound #7 Right Toe Second: Gauze and Kerlix/Conform Dressing Change Frequency: Wound #1 Left Calcaneous: Change dressing every other day. Wound #3 Nose: Change dressing every other day. Wound #4 Medial Coccyx: Change dressing every other day. Wound #6 Left,Medial Upper Leg: Change dressing every other day. Wound #7 Right Toe Second: Change dressing every other day. Follow-up Appointments: Wound #1 Left Calcaneous: Return Appointment in 1 week. Wound #3 Nose: Return Appointment in 1 week. Wound #4 Medial Coccyx: Return Appointment in 1 week. Wound #6 Left,Medial Upper Leg: Return Appointment in 1 week. Wound #7 Right Toe Second: Return Appointment in 1 week. Off-Loading: Wound #1 Left Calcaneous: Open toe surgical shoe with peg assist. Heel suspension boot to: - Sage boots Wound #4 Medial Coccyx: Turn and reposition every 2 hours [name], [name] ([id number]) Wound #6 Left,Medial Upper Leg: Turn and reposition every 2 hours Wound #7 Right Toe Second: Heel suspension boot to: - Sage boots Additional Orders / Instructions: Wound #1 Left Calcaneous: Increase protein intake. Activity as tolerated Wound #3 Nose: Increase protein intake. Activity as tolerated Wound #4 Medial Coccyx: Increase protein intake. Activity as tolerated Wound #6 Left,Medial Upper Leg: Increase protein intake. Activity as tolerated Wound #7 Right Toe Second: Increase protein intake. Activity as tolerated Home Health: Wound #1 Left Calcaneous: [place] Nurse may visit PRN to address patient s wound care needs. FACE TO FACE ENCOUNTER: MEDICARE and MEDICAID PATIENTS: I certify that this patient is under my care and that I had a face-to-face encounter that meets the physician face-to-face encounter requirements with this  patient on this date. The encounter with the patient was in whole or in part for the following MEDICAL CONDITION: (primary reason for [place]) MEDICAL NECESSITY: I certify, that based on my findings, NURSING services are a medically necessary home health service. HOME BOUND STATUS: I certify that my clinical findings support that this patient is homebound (i.e., Due to illness or injury, pt requires aid of supportive devices such as crutches, cane, wheelchairs, walkers, the use of special transportation or the assistance of another person to leave their place of residence. There is a normal inability to leave the home and doing so requires considerable and taxing effort. Other absences are for medical reasons / religious services and are infrequent or of short duration when for other reasons). If current dressing causes regression in wound condition, may D/C ordered dressing product/s and apply Normal Saline Moist Dressing daily until next [place] / Other MD appointment. [place] of regression in wound condition at [phone]. Please direct any NON-WOUND related issues/requests for orders to patient's Primary Care Physician Wound #3 Nose: [place] Nurse may visit PRN to address patient s wound care needs. FACE TO FACE ENCOUNTER: MEDICARE and MEDICAID PATIENTS: I certify that this patient is under my care and that I had a face-to-face encounter that meets the physician face-to-face encounter requirements with this patient on this date. The encounter with the patient was in whole or in part for the following MEDICAL CONDITION: (primary reason for [place]) MEDICAL NECESSITY: I certify, that based on my findings, NURSING services are a medically necessary home health service. HOME BOUND STATUS: I certify that my clinical findings support that this  patient is homebound (i.e., Due to illness or injury, pt requires aid of  supportive devices such as crutches, cane, wheelchairs, walkers, the use of special transportation or the assistance of another person to leave their place of residence. There is a normal inability to leave the home and doing so requires considerable and taxing effort. Other absences are for medical reasons / religious services and are infrequent or of short duration when for other reasons). [name], [name] ([id number]) If current dressing causes regression in wound condition, may D/C ordered dressing product/s and apply Normal Saline Moist Dressing daily until next [place] / Other MD appointment. [place] of regression in wound condition at [phone]. Please direct any NON-WOUND related issues/requests for orders to patient's Primary Care Physician Wound #4 Medial Coccyx: [place] Nurse may visit PRN to address patient s wound care needs. FACE TO FACE ENCOUNTER: MEDICARE and MEDICAID PATIENTS: I certify that this patient is under my care and that I had a face-to-face encounter that meets the physician face-to-face encounter requirements with this patient on this date. The encounter with the patient was in whole or in part for the following MEDICAL CONDITION: (primary reason for [place]) MEDICAL NECESSITY: I certify, that based on my findings, NURSING services are a medically necessary home health service. HOME BOUND STATUS: I certify that my clinical findings support that this patient is homebound (i.e., Due to illness or injury, pt requires aid of supportive devices such as crutches, cane, wheelchairs, walkers, the use of special transportation or the assistance of another person to leave their place of residence. There is a normal inability to leave the home and doing so requires considerable and taxing effort. Other absences are for medical reasons / religious services and are infrequent or of short duration when  for other reasons). If current dressing causes regression in wound condition, may D/C ordered dressing product/s and apply Normal Saline Moist Dressing daily until next [place] / Other MD appointment. [place] of regression in wound condition at [phone]. Please direct any NON-WOUND related issues/requests for orders to patient's Primary Care Physician Wound #6 Left,Medial Upper Leg: [place] Nurse may visit PRN to address patient s wound care needs. FACE TO FACE ENCOUNTER: MEDICARE and MEDICAID PATIENTS: I certify that this patient is under my care and that I had a face-to-face encounter that meets the physician face-to-face encounter requirements with this patient on this date. The encounter with the patient was in whole or in part for the following MEDICAL CONDITION: (primary reason for [place]) MEDICAL NECESSITY: I certify, that based on my findings, NURSING services are a medically necessary home health service. HOME BOUND STATUS: I certify that my clinical findings support that this patient is homebound (i.e., Due to illness or injury, pt requires aid of supportive devices such as crutches, cane, wheelchairs, walkers, the use of special transportation or the assistance of another person to leave their place of residence. There is a normal inability to leave the home and doing so requires considerable and taxing effort. Other absences are for medical reasons / religious services and are infrequent or of short duration when for other reasons). If current dressing causes regression in wound condition, may D/C ordered dressing product/s and apply Normal Saline Moist Dressing daily until next [place] / Other MD appointment. [place] of regression in wound condition at [phone]. Please direct any NON-WOUND  related issues/requests for orders to patient's Primary Care Physician Wound #7  Right Toe Second: [place] Nurse may visit PRN to address patient s wound care needs. FACE TO FACE ENCOUNTER: MEDICARE and MEDICAID PATIENTS: I certify that this patient is under my care and that I had a face-to-face encounter that meets the physician face-to-face encounter requirements with this patient on this date. The encounter with the patient was in whole or in part for the following MEDICAL CONDITION: (primary reason for [place]) MEDICAL NECESSITY: I certify, that based on my findings, NURSING services are a medically necessary home health service. HOME BOUND STATUS: I certify that my clinical findings support that this patient is homebound (i.e., Due to illness or injury, pt requires aid of supportive devices such as crutches, cane, wheelchairs, walkers, the use of special transportation or the assistance of another person to leave their place of residence. There is a normal inability to leave the home and doing so requires considerable and taxing effort. Other absences are for medical reasons / religious services and are infrequent or of short duration when for other reasons). JUDY, NOLF ([id number]) If current dressing causes regression in wound condition, may D/C ordered dressing product/s and apply Normal Saline Moist Dressing daily until next [place] / Other MD appointment. [place] of regression in wound condition at [phone]. Please direct any NON-WOUND related issues/requests for orders to patient's Primary Care Physician Hyperbaric Oxygen Therapy: Wound #1 Left Calcaneous: Indication: - osteomyelitis If appropriate for treatment, begin HBOT per protocol: 2.0 ATA for 90 Minutes without Air Breaks One treatment per day (delivered Monday through Friday unless otherwise specified in Special Instructions below): Total # of Treatments: - 40 Finger stick Blood Glucose Pre- and Post- HBOT  Treatment. Follow Hyperbaric Oxygen Glycemia Protocol HBO Contraindications: Wound #1 Left Calcaneous: HBO contraindications of hyperbaric oxygen therapy were reviewed and the patient found to have no untreated pneumothorax or history of spontaneous pneumothorax. HBO contraindications of hyperbaric oxygen therapy were reviewed and the patient found to have no history of medications such as Bleomycin, Adriamycin, disulfiram, cisplatin and sulfamylon and is not currently receiving any chemotherapy. HBO contraindications of hyperbaric oxygen therapy were reviewed and the patient found to have no Upper respiratory infection and chronic sinusitis. HBO contraindications of hyperbaric oxygen therapy were reviewed and the patient found to have no history of retinal surgery proceeding 6 weeks or intraocular gas HBO contraindications of hyperbaric oxygen therapy were reviewed and the patient found to have no history seizure disorder or any anticonvulsant medication. HBO contraindications of hyperbaric oxygen therapy were reviewed and the patient found to have no septicemia with CO2 retention. HBO contraindications of hyperbaric oxygen therapy were reviewed and the patient found to have no fever greater than 100 degrees. HBO contraindications of hyperbaric oxygen therapy were reviewed and the patient found to have no pregnancy noted. HBO contraindications of hyperbaric oxygen therapy were reviewed and the patient found to have no medications such as steroids or narcotics or Phenergan. Medications-please add to medication list.: Wound #1 Left Calcaneous: P.O. Antibiotics - continue Cipro and Vancomycin as prescribed by Dr. Ola Spurr Her wounds are looking good and the one on the left calcaneum is looking much better. The gluteal wound is also very clean and we will use Prisma on all wounds. We have awaiting her PFT results yet and as soon as we get these reports back we will restart her  on hyperbaric oxygen therapy. Dannielle Karvonen,  Barbra S. ([id number]) She will come back and see me next week. Addendum: as the patient is asymptomatic and the findings are not diagnostic of severe pulmonary disease I believe we will continue with hyperbaric oxygen therapy and see how she tolerates this. If she gets symptomatic we will refer her for a pulmonary consult. Electronic Signature(s) Signed: 03/30/2015 5:00:54 PM By: Christin Fudge MD, FACS Entered By: Christin Fudge on 03/30/2015 15:15:33 [name] ([id number]) -------------------------------------------------------------------------------- [facility] Details Patient Name: [name] Date of Service: 03/30/2015 Medical Record Number: [id number] Patient Account Number: [id number] Date of Birth/Sex: May 31, 1956 (59 y.o. 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, 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, stage 3 L89.323 Pressure ulcer of left buttock, stage 3 L97.512 Non-pressure chronic ulcer of other part of right foot with fat layer exposed Facility Procedures CPT4 Code Description: [id number] 11042 - DEB SUBQ TISSUE 20 SQ CM/< ICD-10 Description Diagnosis E11.621 Type 2 diabetes mellitus with foot ulcer L97.422 Non-pressure chronic ulcer of left heel and midfoot w L97.522 Non-pressure chronic ulcer of other  part  of left foot Modifier: ith fat layer with fat lay Quantity: 1 exposed er exposed Physician Procedures CPT4 Code Description: [id number] 11042 - WC PHYS SUBQ TISS 20 SQ CM ICD-10 Description Diagnosis E11.621 Type 2 diabetes mellitus with foot ulcer L97.422 Non-pressure chronic ulcer of left heel and midfoot w L97.522 Non-pressure chronic ulcer of other part  of left foot Modifier: ith fat layer with fat laye Quantity: 1 exposed r exposed Electronic Signature(s) KIMMI, SCARDINO ([id number]) Signed: 03/30/2015 5:00:54 PM By: Christin Fudge MD, FACS Entered By: Christin Fudge on 03/30/2015 13:50:00

## 2015-04-05 ENCOUNTER — Other Ambulatory Visit: Payer: Self-pay | Admitting: Internal Medicine

## 2015-04-06 ENCOUNTER — Encounter: Payer: PPO | Admitting: Surgery

## 2015-04-06 DIAGNOSIS — L97512 Non-pressure chronic ulcer of other part of right foot with fat layer exposed: Secondary | ICD-10-CM | POA: Diagnosis not present

## 2015-04-07 NOTE — Progress Notes (Signed)
Birth/Sex: 1955-10-12 (59 y.o. Female) Treating RN: [name] Primary Care Physician: [name] Other Clinician: Referring Physician: [name] Treating Physician/Extender: Frann Rider in Treatment: 12 Wound Status Wound Number: 3 Primary Etiology: Pressure Ulcer Wound Location: Nose Wound Status: Open Wounding Event: Pressure Injury Date Acquired: 11/07/2014 Weeks Of Treatment: 11 Clustered Wound: No Photos Photo Uploaded By: Montey Hora on 04/06/2015 15:32:17 Wound Measurements Length: (cm) 0.8 Width: (cm) 0.7 Depth: (cm) 0.1 Area: (cm) 0.44 Volume: (cm) 0.044 % Reduction in Area: 20% % Reduction in Volume: 60% Wound Description Classification: Category/Stage III Periwound Skin Texture Texture Color No Abnormalities Noted: No No Abnormalities Noted: No Moisture No Abnormalities Noted: No Treatment Notes Wound #3 (Nose) 1. Cleansed with: [name] ([id number]) Clean wound with Normal Saline 4. Dressing Applied: Other dressing (specify in notes) Notes betadine paint to nose Electronic Signature(s) Signed: 04/07/2015 4:20:29 PM By: Gretta Cool, RN, BSN, Kim RN, BSN Entered By: Gretta Cool, RN, BSN, Kim  on 04/06/2015 13:26:01 [name] ([id number]) -------------------------------------------------------------------------------- Wound Assessment Details Patient Name: [name] Date of Service: 04/06/2015 1:00 PM Medical Record Number: [id number] Patient Account Number: [id number] Date of Birth/Sex: 06/20/1956 (59 y.o. Female) Treating RN: [name] Primary Care Physician: [name] Other Clinician: Referring Physician: [name] Treating Physician/Extender: Frann Rider in Treatment: 12 Wound Status Wound Number: 7 Primary Diabetic Wound/Ulcer of the Lower Etiology: Extremity Wound Location: Right Toe Second Wound Status: Open Wounding Event: Gradually Appeared Date Acquired: 03/10/2015 Weeks Of Treatment: 3 Clustered Wound: No Photos Photo Uploaded By: Montey Hora on 04/06/2015 15:32:18 Wound Measurements Length: (cm) 0.6 Width: (cm) 1.1 Depth: (cm) 0.2 Area: (cm) 0.518 Volume: (cm) 0.104 % Reduction in Area: 36.6% % Reduction in Volume: -26.8% Wound Description Classification: Grade 1 Periwound Skin Texture Texture Color No Abnormalities Noted: No No Abnormalities Noted: No Moisture No Abnormalities Noted: No Treatment Notes Wound #7 (Right Toe Second) 1. Cleansed with: [name] ([id number]) Clean wound with Normal Saline 2. Anesthetic Topical Lidocaine 4% cream to wound bed prior to debridement 4. Dressing Applied: Prisma Ag 5. Secondary Dressing Applied Bordered Foam Dressing Gauze and Kerlix/Conform 7. Secured with Tape Notes BFD on sacrum, G/K on toe Electronic Signature(s) Signed: 04/07/2015 4:20:29 PM By: Gretta Cool, RN, BSN, Kim RN, BSN Entered By: Gretta Cool, RN, BSN, Kim on 04/06/2015 13:26:02 [name] ([id number]) -------------------------------------------------------------------------------- [place] Details Patient Name: [name] Date of Service: 04/06/2015 1:00 PM Medical Record  Number: [id number] Patient Account Number: [id number] Date of Birth/Sex: 1956-06-08 (59 y.o. Female) Treating RN: [name] Primary Care Physician: [name] Other Clinician: Referring Physician: [name] Treating Physician/Extender: Frann Rider in Treatment: 12 Vital Signs Time Taken: 13:17 Temperature (F): 97.9 Height (in): 68 Pulse (bpm): 69 Weight (lbs): 257 Respiratory Rate (breaths/min): 18 Body Mass Index (BMI): 39.1 Blood Pressure (mmHg): 130/72 Reference Range: 80 - 120 mg / dl Electronic Signature(s) Signed: 04/07/2015 4:20:29 PM By: Gretta Cool, RN, BSN, Kim RN, BSN Entered By: Gretta Cool, RN, BSN, Kim on 04/06/2015 13:18:24  Birth/Sex: 1955-10-12 (59 y.o. Female) Treating RN: [name] Primary Care Physician: [name] Other Clinician: Referring Physician: [name] Treating Physician/Extender: Frann Rider in Treatment: 12 Wound Status Wound Number: 3 Primary Etiology: Pressure Ulcer Wound Location: Nose Wound Status: Open Wounding Event: Pressure Injury Date Acquired: 11/07/2014 Weeks Of Treatment: 11 Clustered Wound: No Photos Photo Uploaded By: Montey Hora on 04/06/2015 15:32:17 Wound Measurements Length: (cm) 0.8 Width: (cm) 0.7 Depth: (cm) 0.1 Area: (cm) 0.44 Volume: (cm) 0.044 % Reduction in Area: 20% % Reduction in Volume: 60% Wound Description Classification: Category/Stage III Periwound Skin Texture Texture Color No Abnormalities Noted: No No Abnormalities Noted: No Moisture No Abnormalities Noted: No Treatment Notes Wound #3 (Nose) 1. Cleansed with: [name] ([id number]) Clean wound with Normal Saline 4. Dressing Applied: Other dressing (specify in notes) Notes betadine paint to nose Electronic Signature(s) Signed: 04/07/2015 4:20:29 PM By: Gretta Cool, RN, BSN, Kim RN, BSN Entered By: Gretta Cool, RN, BSN, Kim  on 04/06/2015 13:26:01 [name] ([id number]) -------------------------------------------------------------------------------- Wound Assessment Details Patient Name: [name] Date of Service: 04/06/2015 1:00 PM Medical Record Number: [id number] Patient Account Number: [id number] Date of Birth/Sex: 06/20/1956 (59 y.o. Female) Treating RN: [name] Primary Care Physician: [name] Other Clinician: Referring Physician: [name] Treating Physician/Extender: Frann Rider in Treatment: 12 Wound Status Wound Number: 7 Primary Diabetic Wound/Ulcer of the Lower Etiology: Extremity Wound Location: Right Toe Second Wound Status: Open Wounding Event: Gradually Appeared Date Acquired: 03/10/2015 Weeks Of Treatment: 3 Clustered Wound: No Photos Photo Uploaded By: Montey Hora on 04/06/2015 15:32:18 Wound Measurements Length: (cm) 0.6 Width: (cm) 1.1 Depth: (cm) 0.2 Area: (cm) 0.518 Volume: (cm) 0.104 % Reduction in Area: 36.6% % Reduction in Volume: -26.8% Wound Description Classification: Grade 1 Periwound Skin Texture Texture Color No Abnormalities Noted: No No Abnormalities Noted: No Moisture No Abnormalities Noted: No Treatment Notes Wound #7 (Right Toe Second) 1. Cleansed with: [name] ([id number]) Clean wound with Normal Saline 2. Anesthetic Topical Lidocaine 4% cream to wound bed prior to debridement 4. Dressing Applied: Prisma Ag 5. Secondary Dressing Applied Bordered Foam Dressing Gauze and Kerlix/Conform 7. Secured with Tape Notes BFD on sacrum, G/K on toe Electronic Signature(s) Signed: 04/07/2015 4:20:29 PM By: Gretta Cool, RN, BSN, Kim RN, BSN Entered By: Gretta Cool, RN, BSN, Kim on 04/06/2015 13:26:02 [name] ([id number]) -------------------------------------------------------------------------------- [place] Details Patient Name: [name] Date of Service: 04/06/2015 1:00 PM Medical Record  Number: [id number] Patient Account Number: [id number] Date of Birth/Sex: 1956-06-08 (59 y.o. Female) Treating RN: [name] Primary Care Physician: [name] Other Clinician: Referring Physician: [name] Treating Physician/Extender: Frann Rider in Treatment: 12 Vital Signs Time Taken: 13:17 Temperature (F): 97.9 Height (in): 68 Pulse (bpm): 69 Weight (lbs): 257 Respiratory Rate (breaths/min): 18 Body Mass Index (BMI): 39.1 Blood Pressure (mmHg): 130/72 Reference Range: 80 - 120 mg / dl Electronic Signature(s) Signed: 04/07/2015 4:20:29 PM By: Gretta Cool, RN, BSN, Kim RN, BSN Entered By: Gretta Cool, RN, BSN, Kim on 04/06/2015 13:18:24  sensitivity : 04/06/2015 Systemic antibiotics : 04/06/2015 Test ordered outside of clinic :  04/06/2015 Notes: Wound/Skin Impairment BRYANNAH, GOGUE ([id number]) Nursing Diagnoses: Impaired tissue integrity Knowledge deficit related to ulceration/compromised skin integrity Goals: Patient/caregiver will verbalize understanding of skin care regimen Date Initiated: 01/10/2015 Goal Status: Active Ulcer/skin breakdown will have a volume reduction of 30% by week 4 Date Initiated: 01/10/2015 Goal Status: Active Ulcer/skin breakdown will have a volume reduction of 50% by week 8 Date Initiated: 01/10/2015 Goal Status: Active Ulcer/skin breakdown will have a volume reduction of 80% by week 12 Date Initiated: 01/10/2015 Goal Status: Active Ulcer/skin breakdown will heal within 14 weeks Date Initiated: 01/10/2015 Goal Status: Active Interventions: Assess patient/caregiver ability to obtain necessary supplies Assess patient/caregiver ability to perform ulcer/skin care regimen upon admission and as needed Assess ulceration(s) every visit Provide education on smoking Provide education on ulcer and skin care Treatment Activities: Consult for HBO : 04/06/2015 Skin care regimen initiated : 04/06/2015 Topical wound management initiated : 04/06/2015 Notes: Electronic Signature(s) Signed: 04/06/2015 4:45:24 PM By: Montey Hora Entered By: Montey Hora on 04/06/2015 13:36:50 [name] ([id number]) -------------------------------------------------------------------------------- Pain Assessment Details Patient Name: [name] Date of Service: 04/06/2015 1:00 PM Medical Record Number: [id number] Patient Account Number: [id number] Date of Birth/Sex: August 15, 1956 (59 y.o. Female) Treating RN: [name] Primary Care Physician: [name] Other Clinician: Referring Physician: [name] Treating Physician/Extender: Frann Rider in Treatment: 12 Active Problems Location of Pain Severity and Description of Pain Patient Has Paino No Site Locations Pain Management and  Medication Current Pain Management: Electronic Signature(s) Signed: 04/07/2015 4:20:29 PM By: Gretta Cool, RN, BSN, Kim RN, BSN Entered By: Gretta Cool, RN, BSN, Kim on 04/06/2015 13:17:08 [name] ([id number]) -------------------------------------------------------------------------------- Patient/Caregiver Education Details Patient Name: [name] Date of Service: 04/06/2015 1:00 PM Medical Record Number: [id number] Patient Account Number: [id number] Date of Birth/Gender: November 09, 1955 (59 y.o. Female) Treating RN: Montey Hora Primary Care Physician: [name] Other Clinician: Referring Physician: [name] Treating Physician/Extender: Frann Rider in Treatment: 12 Education Assessment Education Provided To: Patient and Caregiver Education Topics Provided Wound/Skin Impairment: Handouts: Other: wound care to continue as ordered Methods: Demonstration, Explain/Verbal Responses: State content correctly Electronic Signature(s) Signed: 04/06/2015 2:04:57 PM By: Montey Hora Entered By: Montey Hora on 04/06/2015 14:04:57 [name] ([id number]) -------------------------------------------------------------------------------- Wound Assessment Details Patient Name: [name] Date of Service: 04/06/2015 1:00 PM Medical Record Number: [id number] Patient Account Number: [id number] Date of Birth/Sex: 15-Dec-1955 (59 y.o. Female) Treating RN: [name] Primary Care Physician: [name] Other Clinician: Referring Physician: [name] Treating Physician/Extender: Frann Rider in Treatment: 12 Wound Status Wound Number: 1 Primary Diabetic Wound/Ulcer of the Lower Etiology: Extremity Wound Location: Left Calcaneous Wound Status: Open Wounding Event: Gradually Appeared Date Acquired: 01/03/2015 Weeks Of Treatment: 12 Clustered Wound: No Photos Photo Uploaded By: Montey Hora on 04/06/2015 15:31:45 Wound Measurements Length:  (cm) 0 % Reduction i Width: (cm) 0 % Reduction i Depth: (cm) 0 Area: (cm) 0 Volume: (cm) 0 n Area: 100% n Volume: 100% Wound Description Classification: Grade 2 Periwound Skin Texture Texture Color No Abnormalities Noted: No No Abnormalities Noted: No Moisture No Abnormalities Noted: No Electronic Signature(s) Signed: 04/07/2015 4:20:29 PM By: Gretta Cool, RN, BSN, Kim RN, BSN [address], [place] ([id number]) Entered By: Gretta Cool, RN, BSN, Kim on 04/06/2015 13:26:01 [name] ([id number]) -------------------------------------------------------------------------------- Wound Assessment Details Patient Name: [name] Date of Service: 04/06/2015 1:00 PM Medical Record Number: [id number] Patient Account Number: [id number] Date of  Birth/Sex: 1955-10-12 (59 y.o. Female) Treating RN: [name] Primary Care Physician: [name] Other Clinician: Referring Physician: [name] Treating Physician/Extender: Frann Rider in Treatment: 12 Wound Status Wound Number: 3 Primary Etiology: Pressure Ulcer Wound Location: Nose Wound Status: Open Wounding Event: Pressure Injury Date Acquired: 11/07/2014 Weeks Of Treatment: 11 Clustered Wound: No Photos Photo Uploaded By: Montey Hora on 04/06/2015 15:32:17 Wound Measurements Length: (cm) 0.8 Width: (cm) 0.7 Depth: (cm) 0.1 Area: (cm) 0.44 Volume: (cm) 0.044 % Reduction in Area: 20% % Reduction in Volume: 60% Wound Description Classification: Category/Stage III Periwound Skin Texture Texture Color No Abnormalities Noted: No No Abnormalities Noted: No Moisture No Abnormalities Noted: No Treatment Notes Wound #3 (Nose) 1. Cleansed with: [name] ([id number]) Clean wound with Normal Saline 4. Dressing Applied: Other dressing (specify in notes) Notes betadine paint to nose Electronic Signature(s) Signed: 04/07/2015 4:20:29 PM By: Gretta Cool, RN, BSN, Kim RN, BSN Entered By: Gretta Cool, RN, BSN, Kim  on 04/06/2015 13:26:01 [name] ([id number]) -------------------------------------------------------------------------------- Wound Assessment Details Patient Name: [name] Date of Service: 04/06/2015 1:00 PM Medical Record Number: [id number] Patient Account Number: [id number] Date of Birth/Sex: 06/20/1956 (59 y.o. Female) Treating RN: [name] Primary Care Physician: [name] Other Clinician: Referring Physician: [name] Treating Physician/Extender: Frann Rider in Treatment: 12 Wound Status Wound Number: 7 Primary Diabetic Wound/Ulcer of the Lower Etiology: Extremity Wound Location: Right Toe Second Wound Status: Open Wounding Event: Gradually Appeared Date Acquired: 03/10/2015 Weeks Of Treatment: 3 Clustered Wound: No Photos Photo Uploaded By: Montey Hora on 04/06/2015 15:32:18 Wound Measurements Length: (cm) 0.6 Width: (cm) 1.1 Depth: (cm) 0.2 Area: (cm) 0.518 Volume: (cm) 0.104 % Reduction in Area: 36.6% % Reduction in Volume: -26.8% Wound Description Classification: Grade 1 Periwound Skin Texture Texture Color No Abnormalities Noted: No No Abnormalities Noted: No Moisture No Abnormalities Noted: No Treatment Notes Wound #7 (Right Toe Second) 1. Cleansed with: [name] ([id number]) Clean wound with Normal Saline 2. Anesthetic Topical Lidocaine 4% cream to wound bed prior to debridement 4. Dressing Applied: Prisma Ag 5. Secondary Dressing Applied Bordered Foam Dressing Gauze and Kerlix/Conform 7. Secured with Tape Notes BFD on sacrum, G/K on toe Electronic Signature(s) Signed: 04/07/2015 4:20:29 PM By: Gretta Cool, RN, BSN, Kim RN, BSN Entered By: Gretta Cool, RN, BSN, Kim on 04/06/2015 13:26:02 [name] ([id number]) -------------------------------------------------------------------------------- [place] Details Patient Name: [name] Date of Service: 04/06/2015 1:00 PM Medical Record  Number: [id number] Patient Account Number: [id number] Date of Birth/Sex: 1956-06-08 (59 y.o. Female) Treating RN: [name] Primary Care Physician: [name] Other Clinician: Referring Physician: [name] Treating Physician/Extender: Frann Rider in Treatment: 12 Vital Signs Time Taken: 13:17 Temperature (F): 97.9 Height (in): 68 Pulse (bpm): 69 Weight (lbs): 257 Respiratory Rate (breaths/min): 18 Body Mass Index (BMI): 39.1 Blood Pressure (mmHg): 130/72 Reference Range: 80 - 120 mg / dl Electronic Signature(s) Signed: 04/07/2015 4:20:29 PM By: Gretta Cool, RN, BSN, Kim RN, BSN Entered By: Gretta Cool, RN, BSN, Kim on 04/06/2015 13:18:24

## 2015-04-07 NOTE — Progress Notes (Signed)
SHEMICA, HUSS ([id number]) Visit Report for 04/06/2015 Chief Complaint Document Details Patient Name: [name], [name] Date of Service: 04/06/2015 1:00 PM Medical Record Number: [id number] Patient Account Number: [id number] Date of Birth/Sex: [birth year] (59 y.o. Female) Treating RN: Primary Care Physician: [name] Other Clinician: Referring Physician: [name] Treating Physician/Extender: Frann Rider in Treatment: 12 Information Obtained from: Patient Chief Complaint Patient presents to the wound care center for a consult due non healing wound 59 year old patient who comes with a history of an ulcer on the left second toe for at least about 4 months and a history on her left heel for about 3 weeks. I needed to get an MRI of her left foot but due to her defibrillator this could not be done and hence she is getting get a triple phase bone scan. 01/17/2015 -- she has developed a significant injury to the bridge of the nose where her CPAP machine was fitting. This is a painfull dark spot. Electronic Signature(s) Signed: 04/06/2015 1:47:03 PM By: Christin Fudge MD, FACS Entered By: Christin Fudge on 04/06/2015 13:43:21 [name] ([id number]) -------------------------------------------------------------------------------- Debridement Details Patient Name: [name] Date of Service: 04/06/2015 1:00 PM Medical Record Number: [id number] Patient Account Number: [id number] Date of Birth/Sex: 09-06-1956 (59 y.o. Female) Treating RN: Primary Care Physician: [name] Other Clinician: Referring Physician: [name] Treating Physician/Extender: Frann Rider in Treatment: 12 Debridement Performed for Wound #7 Right Toe Second Assessment: Performed By: Physician Pat Patrick., MD Debridement: Debridement Pre-procedure Yes Verification/Time Out Taken: Start Time: 01:35 Pain Control: Lidocaine 5% topical ointment Level: Skin/Subcutaneous  Tissue Total Area Debrided (L x 0.6 (cm) x 1.1 (cm) = 0.66 (cm) W): Tissue and other Viable, Non-Viable, Eschar, Fibrin/Slough, Subcutaneous material debrided: Instrument: Curette Bleeding: Minimum Hemostasis Achieved: Pressure End Time: 01:38 Procedural Pain: 0 Post Procedural Pain: 0 Response to Treatment: Procedure was tolerated well Post Debridement Measurements of Total Wound Length: (cm) 0.6 Width: (cm) 1.1 Depth: (cm) 0.2 Volume: (cm) 0.104 Electronic Signature(s) Signed: 04/06/2015 1:47:03 PM By: Christin Fudge MD, FACS Entered By: Christin Fudge on 04/06/2015 13:43:00 [name] ([id number]) -------------------------------------------------------------------------------- HPI Details Patient Name: [name] Date of Service: 04/06/2015 1:00 PM Medical Record Number: [id number] Patient Account Number: [id number] Date of Birth/Sex: Feb 26, 1956 (59 y.o. Female) Treating RN: Primary Care Physician: [name] Other Clinician: Referring Physician: [name] Treating Physician/Extender: Frann Rider in Treatment: 12 History of Present Illness HPI Description: 59 year old patient who is known to have diabetes for several years and generally has it under good control last hemoglobin A1c was 6.7 done 2 weeks ago. She has several other comorbidities including acute CHF, hypoxia, hyperlipidemia, systolic heart failure, myocardial infarction, coronary artery disease, iron deficiency anemia, renal insufficiency, essential hypertension, morbid obesity and obstructive sleep apnea. 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 In the past she's had a coronary angioplasty with stent in 2013, laparoscopic cholecystectomy in 2011, bilateral oophorectomy in April 2012 and she's had some lumbar disc surgery in 1996. She says she's had a lot of peripheral neuropathy and due to this she's had a ulcer on her left  second toe for a while. She had a dry eschar on her left heel and recently this was removed and she found that she had some infection and some drainage from there. She has minimal pain around the heel but no pain on any areas of her toes. she has not been on any antibiotics recently and  has known vascular problems in the recent past. 01/17/2015 left foot x-ray done on April 5 reveals there are destructive changes noted of the distal aspect of the proximal phalanx of the left second digit with extension into the adjacent proximal interphalangeal joint space. These findings suggest osteomyelitis with possible septic arthritis. I was able to make a call to Dr. Nevada Crane the radiologist, and had a detailed discussion with him regarding this x-ray. He noted that there were also some changes in the fifth phalanx on the left foot and this could have been chronic osteomyelitis too. The second toe on the left leg shows findings which cannot differentiate between an acute or chronic osteomyelitis and hence he has recommended a MRI with and without contrast. We will get this organized for her and in the meanwhile treat her with oral antibiotics which is susceptible to Bactrim. She was seen yesterday by her PCP Dr. Chaya Jan who put her on 3 antibiotics which include Levaquin and doxycycline and Bactrim. 01/24/2015 She is scheduled for a triple PHASE bone scan on 01/26/2015. She is still on antibiotics and otherwise doing well. 01/31/2015 Bone Imaging 3 phase study -- IMPRESSION:1. There is increased uptake on all 3 phases localizing to the left hindfoot region. This may be a manifestation of osteomyelitis. Correlation with exact site of nonhealing ulcer. 2. There is increased uptake localizing to the second phalanx of the left foot on the blood pool and delayed phase images. 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 Underlying infection not excluded. I have  personally discussed the x-ray studies with the radiologist Dr. Kerby Moors and after comparing all her films he agrees that the left second phalanx does not have any definite underlying infection and this is AZRA, PANTEL. ([id number]) now off a chronic arthropathy. There is a increased uptake in all 3 phases localized to the left hindfoot region and this is a manifestation of osteomyelitis. 02/07/2015 -- She was seen by the ID specialist Dr. Ola Spurr who reviewed her on my request recently. He has recommended a PICC line and IV vancomycin and oral ciprofloxacin for 4 weeks. She will be reviewed by him periodically. He also received several notes from Dr. Sonia Baller to help as documented all the requisites for hyperbaric oxygen therapy. 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 He also had a coronary angioplasty with stent placement in October 2013. Prior to that her 2-D echo showed EF of 35%. Her latest 2-D echo done on 11/01/2014 showed a LV EF of 50-55%. Systolic function and wall motion were within normal limits. Chest x-ray done in January 16, 2015 showed no acute findings. Last hemoglobin A1c done on 12/30/2014 was 6.7. CBC done on the same day was within normal limits, except for a hemoglobin being 8.7 and a hematocrit between 29%. She had arterial duplex study done in August 2015 which showed: No evidence of segmental lower extremity arterial disease at rest, bilaterally. Normal ABI's, bilaterally. Normal great toe pressures, bilaterally. With all the above in place I believe she is a very good candidate for hyperbaric oxygen therapy and I would recommend this with the usual protocol for 6 weeks. she is going to get her PICC line this Friday and the IV antibiotics and when to start. She is also starting on oral ciprofloxacin. 03/03/2015 - recently hospitalized for anemia. No source identified. Started on  amiodarone, contraindication to HBO. Reports left heel  feels better. Main complaint is new buttocks ulcers. No fever or chills. Minimal drainage. 03/10/2015 -- There is a new ulceration noticed yesterday on her right second toe at the tip. She also has noticed some ulcerations on her gluteal region and we will need to take a look at this. As far as her medications go she does not know for sure what her dosage of amiodarone is exactly. She will look it up and let me know today. 03/17/2015 -- as far as her dosage of amiodarone she was taking : 02/25/15 - 03/02/15 400 mg per day; 03/03/15 - 03/10/15 200 mg per day. She stopped taking the medication on 03/10/15. She has not yet done the checks x-ray and her PFTs are still pending. Recent Lab work done on 03/13/2015 shows a glucose is 124, K+ was 3.3, her albumen was 2.9, WBC count was 3.4, hemoglobin was 9.3, hematocrit was 30.4 and platelets were 240. C-reactive protein was 33.7 which was high and the Vancomycin trough was 18.6 which was high. 03/24/2015 -- he saw several doctors since I saw her last and they include her medical oncologist, cardiologist, neurologist and infectious disease doctor. Her PICC line is out and she now is on oral Cipro and doxycycline as per her verbal report. She has completed her pulmonary function test yesterday and a recent chest x-ray is within normal limits. If the PFTs within normal limits she will be able to restart the hyperbaric oxygen therapy. 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 Underlying interstitial lung disease to be considered. Further recommendations were that of a CT chest and pulmonary consult if indicated. Electronic Signature(s) Signed: 04/06/2015 1:47:03 PM By: Christin Fudge MD, FACS [name] ([id number]) Entered By: Christin Fudge on 04/06/2015 13:43:29 [name] ([id number]) -------------------------------------------------------------------------------- Physical Exam Details Patient Name: [name] Date of Service: 04/06/2015 1:00 PM Medical Record Number: [id number] Patient Account Number: [id number] Date of Birth/Sex: 1956/02/18 (59 y.o. Female) Treating RN: Primary Care Physician: [name] Other Clinician: Referring Physician: [name] Treating Physician/Extender: Frann Rider in Treatment: 12 Constitutional . Pulse regular. Respirations normal and unlabored. Afebrile. . Eyes Nonicteric. Reactive to light. Neck supple and nontender. No palpable supraclavicular or cervical adenopathy. Normal sized without goiter. Respiratory WNL. No retractions.. Cardiovascular Pedal Pulses WNL. No clubbing, cyanosis or edema. Musculoskeletal Adexa without tenderness or enlargement.. Digits and nails w/o clubbing, cyanosis, infection, petechiae, ischemia, or inflammatory conditions.. Integumentary (Hair, Skin) No suspicious lesions. No crepitus or fluctuance. No peri-wound warmth or erythema. No masses.Marland Kitchen Psychiatric Judgement and insight Intact.. No evidence of depression, anxiety, or agitation.. Notes Her left calcaneal region and heel had a bit of callus which after sharp debridement showed no open ulceration. Her right second toe still has significant depth and the edges were trimmed and the base fashion. The right gluteal area near the natal cleft has a clean base and we will continue with local care. Electronic Signature(s) Signed: 04/06/2015 1:47:03 PM By: Christin Fudge MD, FACS Entered By: Christin Fudge on 04/06/2015 13:45:10 [name] ([id number]) -------------------------------------------------------------------------------- Physician Orders Details Patient Name: [name] Date of Service: 04/06/2015 1:00 PM Medical Record Number: [id number] Patient Account Number: [id number] Date of  Birth/Sex: Jul 23, 1956 (59 y.o. 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 o May Shower, gently pat wound dry prior to applying new dressing. o May shower with protection. Wound #4 Medial Coccyx o Clean wound with Normal Saline. o May Shower, gently pat wound dry prior to applying new dressing. o May shower with protection. Wound #7 Right Toe Second o Clean wound with Normal Saline. o May Shower, gently pat wound dry prior to applying new dressing. o May shower with protection. Anesthetic Wound #3 Nose o Topical Lidocaine 4% cream applied to wound bed prior to debridement Wound #4 Medial Coccyx o Topical Lidocaine 4% cream applied to wound bed prior to debridement BEVERLY, RHOADES ([id number]) Wound #7 Right Toe Second o Topical Lidocaine 4% cream applied to wound bed prior to debridement Primary Wound Dressing Wound #3 Nose o Other: - betadine paint Wound #4 Medial Coccyx o Prisma Ag Wound #7  Right Toe Second o Prisma Ag Secondary Dressing Wound #4 Medial Coccyx o Boardered Foam Dressing Wound #7 Right Toe Second o Gauze and Kerlix/Conform Dressing Change Frequency Wound #3 Nose o Change dressing every other day. Wound #4 Medial Coccyx o Change dressing every other day. Wound #7 Right Toe Second o Change dressing every other day. Follow-up Appointments Wound #3 Nose o Return Appointment in 1 week. Wound #4 Medial Coccyx o Return Appointment in 1 week. Wound #7 Right Toe Second o Return Appointment in 1 week. Off-Loading Wound #4 Medial Coccyx o Turn and reposition every 2 hours Wound #7 Right Toe Second o Heel suspension boot to: [employer] APIPHANY, TROCHEZ. ([id number]) Additional Orders / Instructions Wound #3 Nose o Increase protein intake. o Activity as tolerated Wound #4 Medial Coccyx o Increase protein intake. o Activity as tolerated Wound #7 Right Toe Second o Increase protein intake. o Activity as tolerated Home Health Wound #3 [place] Visits o Home Health Nurse may visit PRN to address patientos wound care needs. o FACE TO FACE ENCOUNTER: MEDICARE and MEDICAID PATIENTS: I certify that this patient is under my care and that I had a face-to-face encounter that meets the physician face-to-face encounter requirements with this patient on this date. The encounter with the patient was in whole or in part for the following MEDICAL CONDITION: (primary reason for [place]) MEDICAL NECESSITY: I certify, that based on my findings, NURSING services are a medically necessary home health service. HOME BOUND STATUS: I certify that my clinical findings support that this patient is homebound (i.e., Due to illness or injury, pt requires aid of supportive devices such as crutches, cane, wheelchairs, walkers, the use of special transportation or the assistance of another person to leave their place  of residence. There is a normal inability to leave the home and doing so requires considerable and taxing effort. Other absences are for medical reasons / religious services and  are infrequent or of short duration when for other reasons). o If current dressing causes regression in wound condition, may D/C ordered dressing product/s and apply Normal Saline Moist Dressing daily until next [place] / Other MD appointment. [place] of regression in wound condition at [phone]. o Please direct any NON-WOUND related issues/requests for orders to patient's Primary Care Physician Wound #4 [place] Visits o Home Health Nurse may visit PRN to address patientos wound care needs. o FACE TO FACE ENCOUNTER: MEDICARE and MEDICAID PATIENTS: I certify that this patient is under my care and that I had a face-to-face encounter that meets the physician face-to-face encounter requirements with this patient on this date. The encounter with the patient was in whole or in part for the following MEDICAL CONDITION: (primary reason for [place]) MEDICAL NECESSITY: I certify, that based on my findings, NURSING services are a medically necessary home health service. HOME BOUND STATUS: I certify that my clinical findings support that this patient is homebound (i.e., Due to illness or injury, pt requires aid of supportive devices such as crutches, cane, wheelchairs, walkers, the use of special transportation or the assistance of another person to leave their place of residence. There is a normal inability to leave the home and doing so requires considerable and taxing effort. Other JAX, JAQUEZ ([id number]) absences are for medical reasons / religious services and are infrequent or of short duration when for other reasons). o If current dressing causes regression in wound condition, may D/C ordered dressing product/s and apply Normal  Saline Moist Dressing daily until next [place] / Other MD appointment. [place] of regression in wound condition at [phone]. o Please direct any NON-WOUND related issues/requests for orders to patient's Primary Care Physician Wound #7 Right Toe Second o [place] Nurse may visit PRN to address patientos wound care needs. o FACE TO FACE ENCOUNTER: MEDICARE and MEDICAID PATIENTS: I certify that this patient is under my care and that I had a face-to-face encounter that meets the physician face-to-face encounter requirements with this patient on this date. The encounter with the patient was in whole or in part for the following MEDICAL CONDITION: (primary reason for [place]) MEDICAL NECESSITY: I certify, that based on my findings, NURSING services are a medically necessary home health service. HOME BOUND STATUS: I certify that my clinical findings support that this patient is homebound (i.e., Due to illness or injury, pt requires aid of supportive devices such as crutches, cane, wheelchairs, walkers, the use of special transportation or the assistance of another person to leave their place of residence. There is a normal inability to leave the home and doing so requires considerable and taxing effort. Other absences are for medical reasons / religious services and are infrequent or of short duration when for other reasons). o If current dressing causes regression in wound condition, may D/C ordered dressing product/s and apply Normal Saline Moist Dressing daily until next [place] / Other MD appointment. [place] of regression in wound condition at [phone]. o Please direct any NON-WOUND related issues/requests for orders to patient's Primary Care Physician Electronic Signature(s) Signed: 04/06/2015 2:06:52 PM By: Montey Hora Signed: 04/06/2015 4:41:39 PM By: Christin Fudge MD, FACS Entered By: Montey Hora on 04/06/2015 14:06:51 [name] ([id number]) -------------------------------------------------------------------------------- Problem List Details Patient Name: [name] Date of Service: 04/06/2015 1:00 PM Medical Record  Number: [id number] Patient Account Number: [id number] Date of Birth/Sex: January 23, 1956 (59 y.o. Female) Treating RN: Primary Care Physician: [name] Other Clinician: Referring Physician: [name] Treating Physician/Extender: Frann Rider in Treatment: 12 Active Problems ICD-10 Encounter Code Description Active Date Diagnosis E11.621 Type 2 diabetes mellitus with foot ulcer 01/10/2015 Yes L97.422 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 01/10/2015 Yes layer exposed L97.522 Non-pressure chronic ulcer of other part of left foot with fat 01/10/2015 Yes layer exposed [id number] Chronic systolic (congestive) heart failure 01/10/2015 Yes E11.42 Type 2 diabetes mellitus with diabetic polyneuropathy 01/10/2015 Yes S00.33XA Contusion of nose, initial encounter 01/17/2015 Yes 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, left ankle and foot 02/13/2015 Yes 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, stage 3 03/03/2015 Yes L89.323 Pressure ulcer of left buttock, stage 3 03/03/2015 Yes L97.512 Non-pressure chronic ulcer of other part of right foot with 03/10/2015 Yes fat layer exposed ELYSE, ZERBA ([id number]) Inactive Problems Resolved Problems Electronic Signature(s) Signed: 04/06/2015 1:47:03 PM By: Christin Fudge MD, FACS Entered By: Christin Fudge on 04/06/2015 13:41:23 [name] ([id number]) -------------------------------------------------------------------------------- Progress Note Details Patient Name: [name] Date of Service: 04/06/2015 1:00 PM Medical Record Number: [id number] Patient Account Number: [id number] Date of Birth/Sex: [birth year] (59 y.o. Female) Treating  RN: Primary Care Physician: [name] Other Clinician: Referring Physician: [name] Treating Physician/Extender: Frann Rider in Treatment: 12 Subjective Chief Complaint Information obtained from Patient Patient presents to the wound care center for a consult due non healing wound 59 year old patient who comes with a history of an ulcer on the left second toe for at least about 4 months and a history on her left heel for about 3 weeks. I needed to get an MRI of her left foot but due to her defibrillator this could not be done and hence she is getting get a triple phase bone scan. 01/17/2015 -- she has developed a significant injury to the bridge of the nose where her CPAP machine was fitting. This is a painfull dark spot. History of Present Illness (HPI) 59 year old patient who is known to have diabetes for several years and generally has it under good control last hemoglobin A1c was 6.7 done 2 weeks ago. She has several other comorbidities including acute CHF, hypoxia, hyperlipidemia, systolic heart failure, myocardial infarction, coronary artery disease, iron deficiency anemia, renal insufficiency, essential hypertension, morbid obesity and obstructive sleep apnea. 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 In the past she's had a coronary angioplasty with stent in 2013, laparoscopic cholecystectomy in 2011, bilateral oophorectomy in April 2012 and she's had some lumbar disc surgery in 1996. She says she's had a lot of peripheral neuropathy and due to this she's had a ulcer on her left second toe for a while. She had a dry eschar on her left heel and recently this was removed and she found that she had some infection and some drainage from there. She has minimal pain around the heel but no pain on any areas of her toes. she has not been on any antibiotics recently and has known vascular problems in the recent  past. 01/17/2015 left foot x-ray done on April 5 reveals there are destructive changes noted of the distal aspect of the proximal phalanx of the left second digit with extension into the adjacent proximal interphalangeal joint space. These findings suggest osteomyelitis with possible septic arthritis. I was able to make a call to Dr. Nevada Crane the radiologist, and had a  detailed discussion with him regarding this x-ray. He noted that there were also some changes in the fifth phalanx on the left foot and this could have been chronic osteomyelitis too. The second toe on the left leg shows findings which cannot differentiate between an acute or chronic osteomyelitis and hence he has recommended a MRI with and without contrast. We will get this organized for her and in the meanwhile treat her with oral antibiotics which is susceptible to Bactrim. She was seen yesterday by her PCP Dr. Chaya Jan who put her on 3 antibiotics which include Levaquin and doxycycline and Bactrim. 01/24/2015 She is scheduled for a triple PHASE bone scan on 01/26/2015. She is still on antibiotics and otherwise doing well. GENELLA, SCHOUTEN ([id number]) 01/31/2015 Bone Imaging 3 phase study -- IMPRESSION:1. There is increased uptake on all 3 phases localizing to the left hindfoot region. This may be a manifestation of osteomyelitis. Correlation with exact site of nonhealing ulcer. 2. There is increased uptake localizing to the second phalanx of the left foot on the blood pool and delayed phase images. 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 Underlying infection not excluded. I have personally discussed the x-ray studies with the radiologist Dr. Kerby Moors and after comparing all her films he agrees that the left second phalanx does not have any definite underlying infection and this is now off a chronic arthropathy. There is a increased uptake in all 3 phases localized to  the left hindfoot region and this is a manifestation of osteomyelitis. 02/07/2015 -- She was seen by the ID specialist Dr. Ola Spurr who reviewed her on my request recently. He has recommended a PICC line and IV vancomycin and oral ciprofloxacin for 4 weeks. She will be reviewed by him periodically. He also received several notes from Dr. Sonia Baller to help as documented all the requisites for hyperbaric oxygen therapy. 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 He also had a coronary angioplasty with stent placement in October 2013. Prior to that her 2-D echo showed EF of 35%. Her latest 2-D echo done on 11/01/2014 showed a LV EF of 50-55%. Systolic function and wall motion were within normal limits. Chest x-ray done in January 16, 2015 showed no acute findings. Last hemoglobin A1c done on 12/30/2014 was 6.7. CBC done on the same day was within normal limits, except for a hemoglobin being 8.7 and a hematocrit between 29%. She had arterial duplex study done in August 2015 which showed: No evidence of segmental lower extremity arterial disease at rest, bilaterally. Normal ABI's, bilaterally. Normal great toe pressures, bilaterally. With all the above in place I believe she is a very good candidate for hyperbaric oxygen therapy and I would recommend this with the usual protocol for 6 weeks. she is going to get her PICC line this Friday and the IV antibiotics and when to start. She is also starting on oral ciprofloxacin. 03/03/2015 - recently hospitalized for anemia. No source identified. Started on amiodarone, contraindication to HBO. Reports left heel feels better. Main complaint is new buttocks ulcers. No fever or chills. Minimal drainage. 03/10/2015 -- There is a new ulceration noticed yesterday on her right second toe at the tip. She also has noticed some ulcerations on her gluteal region and we will need to take a  look at this. As far as her medications go she does not know for sure what her dosage of  amiodarone is exactly. She will look it up and let me know today. 03/17/2015 -- as far as her dosage of amiodarone she was taking : 02/25/15 - 03/02/15 400 mg per day; 03/03/15 - 03/10/15 200 mg per day. She stopped taking the medication on 03/10/15. She has not yet done the checks x-ray and her PFTs are still pending. Recent Lab work done on 03/13/2015 shows a glucose is 124, K+ was 3.3, her albumen was 2.9, WBC count was 3.4, hemoglobin was 9.3, hematocrit was 30.4 and platelets were 240. C-reactive protein was 33.7 which was high and the Vancomycin trough was 18.6 which was high. 03/24/2015 -- he saw several doctors since I saw her last and they include her medical oncologist, cardiologist, neurologist and infectious disease doctor. Her PICC line is out and she now is on oral Cipro and doxycycline as per her verbal report. She has completed her pulmonary function test yesterday and a VERNA, TRUSTY. ([id number]) recent chest x-ray is within normal limits. If the PFTs within normal limits she will be able to restart the hyperbaric oxygen therapy. 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 Underlying interstitial lung disease to be considered. Further recommendations were that of a CT chest and pulmonary consult if indicated. Objective Constitutional Pulse regular. Respirations normal and unlabored. Afebrile. Vitals Time Taken: 1:17 PM, Height: 68 in, Weight: 257 lbs, BMI: 39.1, Temperature: 97.9 F, Pulse: 69 bpm, Respiratory Rate: 18 breaths/min, Blood Pressure: 130/72 mmHg. Eyes Nonicteric. Reactive to light. Neck supple and nontender. No palpable supraclavicular or cervical adenopathy. Normal sized without goiter. Respiratory WNL. No retractions.. Cardiovascular Pedal Pulses  WNL. No clubbing, cyanosis or edema. Musculoskeletal Adexa without tenderness or enlargement.. Digits and nails w/o clubbing, cyanosis, infection, petechiae, ischemia, or inflammatory conditions.Marland Kitchen Psychiatric Judgement and insight Intact.. No evidence of depression, anxiety, or agitation.. General Notes: Her left calcaneal region and heel had a bit of callus which after sharp debridement showed no open ulceration. Her right second toe still has significant depth and the edges were trimmed and the base fashion. The right gluteal area near the natal cleft has a clean base and we will continue with local care. Integumentary (Hair, Skin) No suspicious lesions. No crepitus or fluctuance. No peri-wound warmth or erythema. No masses.. Wound #1 status is Open. Original cause of wound was Gradually Appeared. The wound is located on the [place]. ([id number]) Left Calcaneous. The wound measures 0cm length x 0cm width x 0cm depth; 0cm^2 area and 0cm^3 volume. Wound #3 status is Open. Original cause of wound was Pressure Injury. The wound is located on the Nose. The wound measures 0.8cm length x 0.7cm width x 0.1cm depth; 0.44cm^2 area and 0.044cm^3 volume. Wound #7 status is Open. Original cause of wound was Gradually Appeared. The wound is located on the Right Toe Second. The wound measures 0.6cm length x 1.1cm width x 0.2cm depth; 0.518cm^2 area and 0.104cm^3 volume. Her left calcaneal region and heel had a bit of callus which after sharp debridement showed no open ulceration. Her right second toe still has significant depth and the edges were trimmed and the base fashion. The right gluteal area near the natal cleft has a clean base and we will continue with local care. 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 She has been cleared to resume hyperbaric oxygen therapy and we will resume this when after she is back from vacation next week. Procedures Wound #7 Wound #7 is a Diabetic Wound/Ulcer of the Lower Extremity located on the Right Toe Second . There was MALIYAH, MEITZLER ([id number]) a Skin/Subcutaneous Tissue Debridement [phone]) debridement with total area of 0.66 sq cm performed by Pat Patrick., MD. with the following instrument(s): Curette to remove Viable and Non-Viable tissue/material including Fibrin/Slough, Eschar, and Subcutaneous after achieving pain control using Lidocaine 5% topical ointment. A time out was conducted prior to the start of the procedure. A Minimum amount of bleeding was controlled with Pressure. The procedure was tolerated well with a pain level of 0 throughout and a pain level of 0 following the procedure. Post Debridement Measurements: 0.6cm length x 1.1cm width x 0.2cm depth; 0.104cm^3 volume. Plan Wound Cleansing: Wound #3 Nose: Clean wound with Normal Saline. May Shower, gently pat wound dry prior to applying new dressing. May shower with protection. Wound #4 Medial Coccyx: Clean wound with Normal Saline. May Shower, gently pat wound dry prior to applying new dressing. May shower with protection. Wound #7 Right Toe Second: Clean wound  with Normal Saline. May Shower, gently pat wound dry prior to applying new dressing. May shower with protection. Anesthetic: Wound #3 Nose: Topical Lidocaine 4% cream applied to wound bed prior to debridement Wound #4 Medial Coccyx: Topical Lidocaine 4% cream applied to wound bed prior to debridement Wound #7 Right Toe Second: Topical Lidocaine 4% cream applied to wound bed prior to debridement Primary Wound Dressing: Wound #3 Nose: Other: - betadine paint Wound #4 Medial Coccyx: Prisma Ag Wound #7 Right Toe Second: Prisma Ag Secondary Dressing: Wound #4 Medial Coccyx: Boardered Foam Dressing Wound #7 Right Toe Second: Gauze and Kerlix/Conform Dressing Change Frequency: Wound #3 Nose: Change dressing every other day. Wound #4 Medial Coccyx: CRYSTINE, ZOBRIST ([id number]) Change dressing every other day. Wound #7 Right Toe Second: Change dressing every other day. Follow-up Appointments: Wound #3 Nose: Return Appointment in 1 week. Wound #4 Medial Coccyx: Return Appointment in 1 week. Wound #7 Right Toe Second: Return Appointment in 1 week. Off-Loading: Wound #4 Medial Coccyx: Turn and reposition every 2 hours Wound #7 Right Toe Second: Heel suspension boot to: - Sage boots Additional Orders / Instructions: Wound #3 Nose: Increase protein intake. Activity as tolerated Wound #4 Medial Coccyx: Increase protein intake. Activity as tolerated Wound #7 Right Toe Second: Increase protein intake. Activity as tolerated Home Health: Wound #3 Nose: Continue Home Health Visits Home Health Nurse may visit PRN to address patient s wound care needs. FACE TO FACE ENCOUNTER: MEDICARE and MEDICAID PATIENTS: I certify that this patient is under my care and that I had a face-to-face encounter that meets the physician face-to-face encounter requirements with this patient on this date. The encounter with the  patient was in whole or in part for the following MEDICAL CONDITION:  (primary reason for [place]) MEDICAL NECESSITY: I certify, that based on my findings, NURSING services are a medically necessary home health service. HOME BOUND STATUS: I certify that my clinical findings support that this patient is homebound (i.e., Due to illness or injury, pt requires aid of supportive devices such as crutches, cane, wheelchairs, walkers, the use of special transportation or the assistance of another person to leave their place of residence. There is a normal inability to leave the home and doing so requires considerable and taxing effort. Other absences are for medical reasons / religious services and are infrequent or of short duration when for other reasons). If current dressing causes regression in wound condition, may D/C ordered dressing product/s and apply Normal Saline Moist Dressing daily until next [place] / Other MD appointment. [place] of regression in wound condition at [phone]. Please direct any NON-WOUND related issues/requests for orders to patient's Primary Care Physician Wound #4 Medial Coccyx: [place] Nurse may visit PRN to address patient s wound care needs. FACE TO FACE ENCOUNTER: MEDICARE and MEDICAID PATIENTS: I certify that this patient is under my care and that I had a face-to-face encounter that meets the physician face-to-face encounter requirements with this patient on this date. The encounter with the patient was in whole or in part for the following MEDICAL CONDITION: (primary reason for [place]) MEDICAL NECESSITY: I certify, that based on my findings, NURSING services are a medically necessary home health service. HOME DONIE, CALTON ([id number]) BOUND STATUS: I certify that my clinical findings support that this patient is homebound (i.e., Due to illness or injury, pt requires aid of supportive devices such as crutches, cane, wheelchairs, walkers, the  use of special transportation or the assistance of another person to leave their place of residence. There is a normal inability to leave the home and doing so requires considerable and taxing effort. Other absences are for medical reasons / religious services and are infrequent or of short duration when for other reasons). If current dressing causes regression in wound condition, may D/C ordered dressing product/s and apply Normal Saline Moist Dressing daily until next [place] / Other MD appointment. [place] of regression in wound condition at [phone]. Please direct any NON-WOUND related issues/requests for orders to patient's Primary Care Physician Wound #7 Right Toe Second: [place] Nurse may visit PRN to address patient s wound care needs. FACE TO FACE ENCOUNTER: MEDICARE and MEDICAID PATIENTS: I certify that this patient is under my care and that I had a face-to-face encounter that meets the physician face-to-face encounter requirements with this patient on this date. The encounter with the patient was in whole or in part for the following MEDICAL CONDITION: (primary reason for [place]) MEDICAL NECESSITY: I certify, that based on my findings, NURSING services are a medically necessary home health service. HOME BOUND STATUS: I certify that my clinical findings support that this patient is homebound (i.e., Due to illness or injury, pt requires aid of supportive devices such as crutches, cane, wheelchairs, walkers, the use of special transportation or the assistance of another person to leave their place of residence. There is a normal inability to leave the home and doing so requires considerable and taxing effort. Other absences are for medical reasons / religious services and are infrequent or of short duration when  for other reasons). If current dressing causes regression in wound condition, may D/C ordered  dressing product/s and apply Normal Saline Moist Dressing daily until next [place] / Other MD appointment. [place] of regression in wound condition at [phone]. Please direct any NON-WOUND related issues/requests for orders to patient's Primary Care Physician Overall she is doing very well and has had no significant issues especially with all the ulcerations looking cleaner and we will continue with [employer] over this. She has been cleared to resume hyperbaric oxygen therapy and we will resume this when after she is back from vacation next week. Electronic Signature(s) Signed: 04/06/2015 4:43:48 PM By: Christin Fudge MD, FACS Previous Signature: 04/06/2015 1:47:03 PM Version By: Christin Fudge MD, FACS Entered By: Christin Fudge on 04/06/2015 16:42:27 [name] ([id number]) -------------------------------------------------------------------------------- SuperBill Details Patient Name: [name] Date of Service: 04/06/2015 Medical Record Number: [id number] Patient Account Number: [id number] Date of Birth/Sex: 19-Feb-1956 (59 y.o. Female) Treating RN: Primary Care Physician: [name] Other Clinician: Referring Physician: [name] Treating Physician/Extender: Frann Rider in Treatment: 12 Diagnosis Coding ICD-10 Codes Code Description E11.621 Type 2 diabetes mellitus with foot ulcer L97.422 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 layer exposed L97.522 Non-pressure chronic ulcer of other part of left foot with fat layer exposed [id number] Chronic systolic (congestive) heart failure E11.42 Type 2 diabetes mellitus with diabetic polyneuropathy S00.33XA Contusion of nose, initial encounter 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, 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, stage 3 L89.323 Pressure ulcer of left buttock, stage 3 L97.512 Non-pressure chronic ulcer of other part of right foot  with fat layer exposed Facility Procedures CPT4 Code Description: [id number] - DEB SUBQ TISSUE 20 SQ CM/< ICD-10 Description Diagnosis E11.621 Type 2 diabetes mellitus with foot ulcer L97.422 Non-pressure chronic ulcer of left heel and midfoot w L97.522 Non-pressure chronic ulcer of other  part of left foot Modifier: ith fat layer with fat lay Quantity: 1 exposed er exposed Physician Procedures CPT4 Code Description: [id number] 11042 - WC PHYS SUBQ TISS 20 SQ CM ICD-10 Description Diagnosis E11.621 Type 2 diabetes mellitus with foot ulcer L97.422 Non-pressure chronic ulcer of left heel and midfoot w L97.522 Non-pressure chronic ulcer of other part  of left foot Modifier: ith fat layer with fat laye Quantity: 1 exposed r exposed Electronic Signature(s) TELISSA, SERFASS ([id number]) Signed: 04/06/2015 1:47:03 PM By: Christin Fudge MD, FACS Entered By: Christin Fudge on 04/06/2015 13:46:42

## 2015-04-11 ENCOUNTER — Encounter: Payer: PPO | Admitting: Neurology

## 2015-04-13 ENCOUNTER — Encounter: Payer: PPO | Admitting: Neurology

## 2015-04-17 ENCOUNTER — Encounter: Payer: PPO | Attending: Surgery | Admitting: Surgery

## 2015-04-17 DIAGNOSIS — S0033XA Contusion of nose, initial encounter: Secondary | ICD-10-CM | POA: Diagnosis not present

## 2015-04-17 DIAGNOSIS — L89323 Pressure ulcer of left buttock, stage 3: Secondary | ICD-10-CM | POA: Insufficient documentation

## 2015-04-17 DIAGNOSIS — L97512 Non-pressure chronic ulcer of other part of right foot with fat layer exposed: Secondary | ICD-10-CM | POA: Insufficient documentation

## 2015-04-17 DIAGNOSIS — E785 Hyperlipidemia, unspecified: Secondary | ICD-10-CM | POA: Diagnosis not present

## 2015-04-17 DIAGNOSIS — E1142 Type 2 diabetes mellitus with diabetic polyneuropathy: Secondary | ICD-10-CM | POA: Insufficient documentation

## 2015-04-17 DIAGNOSIS — L97522 Non-pressure chronic ulcer of other part of left foot with fat layer exposed: Secondary | ICD-10-CM | POA: Insufficient documentation

## 2015-04-17 DIAGNOSIS — L97422 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 layer exposed: Secondary | ICD-10-CM | POA: Diagnosis not present

## 2015-04-17 DIAGNOSIS — L89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, stage 3: Secondary | ICD-10-CM | POA: Diagnosis not present

## 2015-04-17 DIAGNOSIS — L02611 Cutaneous abscess of right foot: Secondary | ICD-10-CM | POA: Diagnosis not present

## 2015-04-17 DIAGNOSIS — X58XXXA Exposure to other specified factors, initial encounter: Secondary | ICD-10-CM | POA: Diagnosis not present

## 2015-04-17 DIAGNOSIS — M869 Osteomyelitis, unspecified: Secondary | ICD-10-CM | POA: Diagnosis not present

## 2015-04-17 DIAGNOSIS — E11621 Type 2 diabetes mellitus with foot ulcer: Secondary | ICD-10-CM | POA: Diagnosis not present

## 2015-04-17 DIAGNOSIS — D649 Anemia, unspecified: Secondary | ICD-10-CM | POA: Diagnosis not present

## 2015-04-17 DIAGNOSIS — I10 Essential (primary) hypertension: Secondary | ICD-10-CM | POA: Diagnosis not present

## 2015-04-17 DIAGNOSIS — I5022 Chronic systolic (congestive) heart failure: Secondary | ICD-10-CM | POA: Diagnosis not present

## 2015-04-17 DIAGNOSIS — N289 Disorder of kidney and ureter, unspecified: Secondary | ICD-10-CM | POA: Insufficient documentation

## 2015-04-17 LAB — GLUCOSE, CAPILLARY
GLUCOSE-CAPILLARY: 216 mg/dL — AB (ref 65–99)
Glucose-Capillary: 199 mg/dL — ABNORMAL HIGH (ref 65–99)

## 2015-04-17 NOTE — Progress Notes (Addendum)
[name] ([id number]) Visit Report for 04/17/2015 HBO Details Patient Name: [name], [name] Date of Service: 04/17/2015 10:00 AM Medical Record Number: [id number] Patient Account Number: [id number] Date of Birth/Sex: [birth year] (59 y.o. 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 HBO Treatment Details Treatment Number: 1 Patient Type: Outpatient Chamber Type: Monoplace Chamber #: [id number] Treatment Protocol: 2.0 ATA with 90 minutes oxygen, and no air breaks Treatment Details Compression Rate Down: 1.5 psi / minute De-Compression Rate Up: 1.5 psi / minute Air breaks and breathing Compress Tx Pressure Decompress Decompress periods Begins Reached Begins Ends (leave unused spaces blank) Chamber Pressure 1 ATA 2.0 ATA - - - - - - 2.0 ATA 1 ATA Clock Time (24 hr) 10:24 10:34 - - - - - - 12:04 12:14 Treatment Length: 110 (minutes) Treatment Segments: 4 Capillary Blood Glucose Pre Capillary Blood Glucose (mg/dl): Post Capillary Blood Glucose (mg/dl): Vital Signs Capillary Blood Glucose Reference Range: 80 - 120 mg / dl HBO Diabetic Blood Glucose Intervention Range: <131 mg/dl or >249 mg/dl Time Vitals Blood Respiratory Capillary Blood Glucose Pulse Action Type: Pulse: Temperature: Taken: Pressure: Rate: Glucose (mg/dl): Meter #: Oximetry (%) Taken: Pre 09:34 83/45 60 18 97.8 216 1 none Post 12:19 94/58 78 18 97.6 199 1 none Treatment Response Well [name] ([id number]) Treatment Toleration: Treatment Treatment Completed without Adverse Event Completion Status: Electronic  Signature(s) Signed: 04/18/2015 12:24:37 PM By: Christin Fudge MD, FACS Signed: 04/18/2015 3:35:53 PM By: Lorine Bears RCP, RRT, CHT Previous Signature: 04/17/2015 3:41:07 PM Version By: Lorine Bears RCP, RRT, CHT Previous Signature: 04/17/2015 4:09:13 PM Version By: Christin Fudge MD, FACS Previous Signature: 04/17/2015 12:47:48 PM Version By: Christin Fudge MD, FACS Previous Signature: 04/17/2015 12:47:39 PM Version By: Christin Fudge MD, FACS Previous Signature: 04/17/2015 12:34:21 PM Version By: Christin Fudge MD, FACS Entered By: Lorine Bears on 04/18/2015 10:43:06 [name] ([id number]) -------------------------------------------------------------------------------- HBO Safety Checklist Details Patient Name: [name] Date of Service: 04/17/2015 10:00 AM Medical Record Number: [id number] Patient Account Number: [id number] Date of Birth/Sex: Jun 24, 1956 (59 y.o. Female) Treating RN: Primary Care Physician: [name] Other Clinician: Jacqulyn Bath Referring Physician: [name] Treating Physician/Extender: Frann Rider in Treatment: 13 HBO Safety Checklist Items Safety Checklist Consent Form Signed Patient voided / foley secured and emptied When did you last eato 2330 pm on 04/16/15 Last dose of injectable or oral agent 08:15 am Ostomy pouch emptied and vented if applicable n/a All implantable devices assessed, documented and approved ICD Intravenous access site secured and place n/a Valuables secured Linens and cotton and cotton/polyester blend (less than 51% polyester) Personal oil-based products / skin lotions / body lotions removed Wigs or hairpieces removed Smoking or tobacco materials removed Books / newspapers / magazines / loose paper removed Cologne, aftershave, perfume and deodorant removed Jewelry removed (may wrap wedding band) Make-up removed Hair care products removed Battery operated devices  (external) removed Heating patches and chemical warmers removed Titanium eyewear removed n/a Nail polish cured greater than 10 hours n/a Casting material cured greater than 10 hours n/a Hearing aids removed n/a Loose dentures or partials removed n/a Prosthetics have been removed n/a Patient demonstrates correct use of air break device (if applicable) Patient  concerns have been addressed Patient grounding bracelet on and cord attached to chamber Specifics for Inpatients (complete in addition to above) Medication sheet sent with patient Intravenous medications needed or due during therapy sent with patient SATRINA, KIESCHNICK ([id number]) Drainage tubes (e.g. nasogastric tube or chest tube secured and vented) Endotracheal or Tracheotomy tube secured Cuff deflated of air and inflated with saline Airway suctioned Electronic Signature(s) Signed: 04/17/2015 3:41:07 PM By: Lorine Bears RCP, RRT, CHT Entered By: Lorine Bears on 04/17/2015 10:32:33

## 2015-04-17 NOTE — Progress Notes (Signed)
CHALENE, [name] ([id number]) Visit Report for 04/17/2015 Arrival Information Details Patient Name: Vickie, LEDDON Date of Service: 04/17/2015 10:00 AM Medical Record Number: [id number] Patient Account Number: [id number] Date of Birth/Sex: Sep 02, 1956 (59 y.o. Female) Treating RN: Primary Care Physician: [name] Other Clinician: Jacqulyn Bath Referring Physician: [name] Treating Physician/Extender: Frann Rider in Treatment: 45 Visit Information History Since Last Visit Added or deleted any medications: No Patient Arrived: Wheel Chair Any new allergies or adverse reactions: No Arrival Time: 09:31 Had a fall or experienced change in No Accompanied By: husband activities of daily living that may affect Transfer Assistance: Manual risk of falls: Patient Identification Verified: Yes Signs or symptoms of abuse/neglect since last No Secondary Verification Process Yes visito Completed: Hospitalized since last visit: No Patient Requires Transmission- No Has Dressing in Place as Prescribed: Yes Based Precautions: Pain Present Now: No Patient Has Alerts: Yes Patient Alerts: Patient on Blood Thinner ABI L:0.8, R:0.93 Electronic Signature(s) Signed: 04/17/2015 3:41:07 PM By: Lorine Bears RCP, RRT, CHT Entered By: Lorine Bears on 04/17/2015 09:46:47 [name] ([id number]) -------------------------------------------------------------------------------- Encounter Discharge Information Details Patient Name: [name] Date of Service: 04/17/2015 10:00 AM Medical Record Number: [id number] Patient Account Number: [id number] Date of Birth/Sex: 1955-12-13 (59 y.o. Female) Treating RN: Primary Care Physician: [name] Other Clinician: Jacqulyn Bath Referring Physician: [name] Treating Physician/Extender: Frann Rider in Treatment: 14 Encounter Discharge Information Items Discharge Pain Level:  0 Discharge Condition: Stable Ambulatory Status: Wheelchair Discharge Destination: Home Private Transportation: Auto Accompanied By: husband Schedule Follow-up Appointment: No Medication Reconciliation completed and No provided to Patient/Care Vidhi Delellis: Clinical Summary of Care: Notes Patient has an HBO treatment scheduled on 04/18/15 at 10:00 am. Electronic Signature(s) Signed: 04/17/2015 3:41:07 PM By: Lorine Bears RCP, RRT, CHT Entered By: Lorine Bears on 04/17/2015 13:05:18 [name] ([id number]) -------------------------------------------------------------------------------- Vitals Details Patient Name: [name] Date of Service: 04/17/2015 10:00 AM Medical Record Number: [id number] Patient Account Number: [id number] Date of Birth/Sex: 1955-12-15 (59 y.o. Female) Treating RN: Primary Care Physician: [name] Other Clinician: Jacqulyn Bath Referring Physician: [name] Treating Physician/Extender: Frann Rider in Treatment: 13 Vital Signs Time Taken: 09:34 Temperature (F): 97.8 Height (in): 68 Pulse (bpm): 60 Weight (lbs): 257 Respiratory Rate (breaths/min): 18 Body Mass Index (BMI): 39.1 Blood Pressure (mmHg): 83/45 Capillary Blood Glucose (mg/dl): 216 Reference Range: 80 - 120 mg / dl Electronic Signature(s) Signed: 04/17/2015 3:41:07 PM By: Lorine Bears RCP, RRT, CHT Entered By: Lorine Bears on 04/17/2015 10:24:13

## 2015-04-18 ENCOUNTER — Encounter: Payer: PPO | Admitting: Surgery

## 2015-04-18 ENCOUNTER — Encounter: Payer: PPO | Admitting: Neurology

## 2015-04-18 DIAGNOSIS — L97422 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 layer exposed: Secondary | ICD-10-CM | POA: Diagnosis not present

## 2015-04-18 LAB — GLUCOSE, CAPILLARY
Glucose-Capillary: 183 mg/dL — ABNORMAL HIGH (ref 65–99)
Glucose-Capillary: 177 mg/dL — ABNORMAL HIGH (ref 65–99)

## 2015-04-18 NOTE — Progress Notes (Signed)
WINDI, MCFAUL ([id number]) Visit Report for 04/18/2015 Arrival Information Details Patient Name: [name], [name] Date of Service: 04/18/2015 10:00 AM Medical Record Number: [id number] Patient Account Number: [id number] Date of Birth/Sex: April 03, 1956 (59 y.o. Female) Treating RN: Primary Care Physician: [name] Other Clinician: Jacqulyn Bath Referring Physician: [name] Treating Physician/Extender: Frann Rider in Treatment: 14 Visit Information History Since Last Visit Added or deleted any medications: No Patient Arrived: Wheel Chair Any new allergies or adverse reactions: No Arrival Time: 09:40 Had a fall or experienced change in No Accompanied By: husband activities of daily living that may affect Transfer Assistance: Manual risk of falls: Patient Identification Verified: Yes Signs or symptoms of abuse/neglect No Secondary Verification Process Yes since last visito Completed: Hospitalized since last visit: No Patient Requires Transmission- No Has Dressing in Place as Prescribed: Yes Based Precautions: Has Footwear/Offloading in Place as Yes Patient Has Alerts: Yes Prescribed: Patient Alerts: Patient on Blood Left: Surgical Shoe with Thinner Pressure Relief Insole ABI L:0.8, Pain Present Now: No R:0.93 Electronic Signature(s) Signed: 04/18/2015 3:35:53 PM By: Lorine Bears RCP, RRT, CHT Entered By: Lorine Bears on 04/18/2015 10:22:18 [name] ([id number]) -------------------------------------------------------------------------------- Encounter Discharge Information Details Patient Name: [name] Date of Service: 04/18/2015 10:00 AM Medical Record Number: [id number] Patient Account Number: [id number] Date of Birth/Sex: 1956-01-17 (59 y.o. Female) Treating RN: Primary Care Physician: [name] Other Clinician: Jacqulyn Bath Referring Physician: [name] Treating  Physician/Extender: Frann Rider in Treatment: 33 Encounter Discharge Information Items Discharge Pain Level: 0 Discharge Condition: Stable Ambulatory Status: Ambulatory Discharge Destination: Home Private Transportation: Auto Accompanied By: husband Schedule Follow-up Appointment: No Medication Reconciliation completed and No provided to Patient/Care Kadie Balestrieri: Clinical Summary of Care: Notes Patient has an HBO treatment scheduled on 04/19/15 at 10:00 am. Electronic Signature(s) Signed: 04/18/2015 3:35:53 PM By: Lorine Bears RCP, RRT, CHT Entered By: Lorine Bears on 04/18/2015 12:50:25 [name] ([id number]) -------------------------------------------------------------------------------- Vitals Details Patient Name: [name] Date of Service: 04/18/2015 10:00 AM Medical Record Number: [id number] Patient Account Number: [id number] Date of Birth/Sex: 1956-05-03 (59 y.o. Female) Treating RN: Primary Care Physician: [name] Other Clinician: Jacqulyn Bath Referring Physician: [name] Treating Physician/Extender: Frann Rider in Treatment: 14 Vital Signs Time Taken: 09:42 Temperature (F): 97.6 Height (in): 68 Pulse (bpm): 72 Weight (lbs): 257 Respiratory Rate (breaths/min): 18 Body Mass Index (BMI): 39.1 Blood Pressure (mmHg): 94/60 Capillary Blood Glucose (mg/dl): 183 Reference Range: 80 - 120 mg / dl Electronic Signature(s) Signed: 04/18/2015 3:35:53 PM By: Lorine Bears RCP, RRT, CHT Entered By: Becky Sax, Amado Nash on 04/18/2015 10:22:54

## 2015-04-18 NOTE — Progress Notes (Signed)
[name] ([id number]) Visit Report for 04/18/2015 HBO Details Patient Name: [name], [name] Date of Service: 04/18/2015 10:00 AM Medical Record Number: [id number] Patient Account Number: [id number] Date of Birth/Sex: Jan 20, 1956 (59 y.o. 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 HBO Treatment Details Treatment Number: 5 Patient Type: Outpatient Chamber Type: Monoplace Chamber #: [id number] Treatment Protocol: 2.0 ATA with 90 minutes oxygen, and no air breaks Treatment Details Compression Rate Down: 1.5 psi / minute De-Compression Rate Up: 1.5 psi / minute Air breaks and breathing Compress Tx Pressure Decompress Decompress periods Begins Reached Begins Ends (leave unused spaces blank) Chamber Pressure 1 ATA 2.0 ATA - - - - - - 2.0 ATA 1 ATA Clock Time (24 hr) 10:18 10:30 - - - - - - 12:00 12:10 Treatment Length: 112 (minutes) Treatment Segments: 4 Capillary Blood Glucose Pre Capillary Blood Glucose (mg/dl): Post Capillary Blood Glucose (mg/dl): Vital Signs Capillary Blood Glucose Reference Range: 80 - 120 mg / dl HBO Diabetic Blood Glucose Intervention Range: <131 mg/dl or >249 mg/dl Time Vitals Blood Respiratory Capillary Blood Glucose Pulse Action Type: Pulse: Temperature: Taken: Pressure: Rate: Glucose (mg/dl): Meter #: Oximetry (%) Taken: Pre 09:42 94/60 72 18 97.6 183 1 none Post 12:16 120/62 72 18 94.8 177 1 none Treatment Response Well [name], [name] ([id number]) Treatment Toleration: Treatment Treatment Completed without Adverse Event Completion Status: Electronic  Signature(s) Signed: 04/18/2015 3:35:53 PM By: Lorine Bears RCP, RRT, CHT Signed: 04/18/2015 4:00:02 PM By: Christin Fudge MD, FACS Previous Signature: 04/18/2015 12:24:01 PM Version By: Christin Fudge MD, FACS Entered By: Lorine Bears on 04/18/2015 12:49:38 [name] ([id number]) -------------------------------------------------------------------------------- HBO Safety Checklist Details Patient Name: [name] Date of Service: 04/18/2015 10:00 AM Medical Record Number: [id number] Patient Account Number: [id number] Date of Birth/Sex: [birth year] (59 y.o. Female) Treating RN: Primary Care Physician: [name] Other Clinician: Jacqulyn Bath Referring Physician: [name] Treating Physician/Extender: Frann Rider in Treatment: 14 HBO Safety Checklist Items Safety Checklist Consent Form Signed Patient voided / foley secured and emptied When did you last eato 1900 pm 04/17/15 Last dose of injectable or oral agent 08:15 am Ostomy pouch emptied and vented if applicable n/a All implantable devices assessed, documented and approved ICD Intravenous access site secured and place n/a Valuables secured Linens and cotton and cotton/polyester blend (less than 51% polyester) Personal oil-based products / skin lotions / body lotions removed Wigs or hairpieces removed Smoking or tobacco materials removed Books / newspapers / magazines / loose paper removed Cologne, aftershave, perfume and deodorant removed Jewelry removed (may wrap wedding band) Make-up removed Hair care products removed Battery operated devices (external) removed Heating patches and chemical warmers removed Titanium eyewear removed n/a Nail polish cured greater than 10 hours n/a Casting material cured greater than 10 hours n/a Hearing aids removed n/a Loose dentures or partials removed n/a Prosthetics have been removed n/a Patient demonstrates correct use of air  break device (if applicable) Patient concerns have been addressed Patient grounding bracelet on and cord attached to chamber Specifics for Inpatients (complete in addition to above) Medication sheet sent with patient Intravenous medications needed or due during therapy sent with patient [name], [name] ([id number]) Drainage tubes (e.g. nasogastric tube or chest  tube secured and vented) Endotracheal or Tracheotomy tube secured Cuff deflated of air and inflated with saline Airway suctioned Electronic Signature(s) Signed: 04/18/2015 3:35:53 PM By: Lorine Bears RCP, RRT, CHT Entered By: Lorine Bears on 04/18/2015 10:24:37

## 2015-04-19 ENCOUNTER — Telehealth: Payer: Self-pay | Admitting: *Deleted

## 2015-04-19 ENCOUNTER — Encounter: Payer: PPO | Admitting: Surgery

## 2015-04-19 DIAGNOSIS — L97422 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 layer exposed: Secondary | ICD-10-CM | POA: Diagnosis not present

## 2015-04-19 LAB — GLUCOSE, CAPILLARY
Glucose-Capillary: 185 mg/dL — ABNORMAL HIGH (ref 65–99)
Glucose-Capillary: 164 mg/dL — ABNORMAL HIGH (ref 65–99)

## 2015-04-19 NOTE — Progress Notes (Signed)
NATICA, HEADRICK ([id number]) Visit Report for 04/19/2015 HBO Details Patient Name: [name], [name] Date of Service: 04/19/2015 10:00 AM Medical Record Number: [id number] Patient Account Number: [id number] Date of Birth/Sex: 1956-02-08 (59 y.o. 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 HBO Treatment Details Treatment Number: 6 Patient Type: Outpatient Chamber Type: Monoplace Chamber #: [id number] Treatment Protocol: 2.0 ATA with 90 minutes oxygen, and no air breaks Treatment Details Compression Rate Down: 1.5 psi / minute De-Compression Rate Up: 1.5 psi / minute Air breaks and breathing Compress Tx Pressure Decompress Decompress periods Begins Reached Begins Ends (leave unused spaces blank) Chamber Pressure 1 ATA 2.0 ATA - - - - - - 2.0 ATA 1 ATA Clock Time (24 hr) 09:51 10:03 - - - - - - 11:33 11:44 Treatment Length: 113 (minutes) Treatment Segments: 4 Capillary Blood Glucose Pre Capillary Blood Glucose (mg/dl): Post Capillary Blood Glucose (mg/dl): Vital Signs Capillary Blood Glucose Reference Range: 80 - 120 mg / dl HBO Diabetic Blood Glucose Intervention Range: <131 mg/dl or >249 mg/dl Time Vitals Blood Respiratory Capillary Blood Glucose Pulse Action Type: Pulse: Temperature: Taken: Pressure: Rate: Glucose (mg/dl): Meter #: Oximetry (%) Taken: Pre 09:37 92/54 84 18 98.2 185 1 none Post 11:49 104/54 72 18 98 164 1 none Treatment Response Well BRITNAY, FITTIPALDI ([id number]) Treatment Toleration: Treatment Treatment Completed without Adverse Event Completion Status: HBO  Attestation I certify that I supervised this HBO treatment in accordance with Medicare guidelines. A trained Yes emergency response team is readily available per hospital policies and procedures. Continue HBOT as ordered. Yes Electronic Signature(s) Signed: 04/19/2015 3:34:44 PM By: Loletha Grayer MD Previous Signature: 04/19/2015 12:15:44 PM Version By: Lorine Bears RCP, RRT, CHT Entered By: Loletha Grayer on 04/19/2015 13:13:39 [name] ([id number]) -------------------------------------------------------------------------------- HBO Safety Checklist Details Patient Name: [name] Date of Service: 04/19/2015 10:00 AM Medical Record Number: [id number] Patient Account Number: [id number] Date of Birth/Sex: 1955/10/29 (59 y.o. Female) Treating RN: Primary Care Physician: [name] Other Clinician: Jacqulyn Bath Referring Physician: [name] Treating Physician/Extender: BURNS III, Charlean Sanfilippo in Treatment: 14 HBO Safety Checklist Items Safety Checklist Consent Form Signed Patient voided / foley secured and emptied When did you last eato 1900 pm on 04/18/15 Last dose of injectable or oral agent 08:00 am Ostomy pouch emptied and vented if applicable n/a All implantable devices assessed, documented and approved ICD Intravenous access site secured and place n/a Valuables secured Linens and cotton and cotton/polyester blend (less than 51% polyester) Personal oil-based products / skin lotions / body lotions removed Wigs or hairpieces removed Smoking or tobacco materials removed Books / newspapers / magazines / loose paper removed Cologne, aftershave, perfume and deodorant removed Jewelry removed (may wrap wedding band) Make-up removed Hair care products removed Battery operated devices (external) removed Heating patches and chemical warmers removed Titanium eyewear removed n/a Nail polish cured greater than 10 hours n/a Casting  material cured greater than 10 hours n/a Hearing aids removed n/a Loose dentures or partials removed n/a Prosthetics have been removed n/a Patient demonstrates correct use of air break device (if applicable) Patient concerns have been addressed Patient grounding bracelet on and cord attached to chamber Specifics for Inpatients (complete in addition  to above) Medication sheet sent with patient Intravenous medications needed or due during therapy sent with patient SHAOLIN, BRODZIK ([id number]) Drainage tubes (e.g. nasogastric tube or chest tube secured and vented) Endotracheal or Tracheotomy tube secured Cuff deflated of air and inflated with saline Airway suctioned Electronic Signature(s) Signed: 04/19/2015 12:15:44 PM By: Lorine Bears RCP, RRT, CHT Entered By: Lorine Bears on 04/19/2015 09:55:05

## 2015-04-19 NOTE — Telephone Encounter (Signed)
Christy from [facility] called wanting to check [name] Hgb. Ok per [employer], [place]

## 2015-04-19 NOTE — Progress Notes (Signed)
ESLIE, EBBS ([id number]) Visit Report for 04/19/2015 Arrival Information Details Patient Name: [name], [name] Date of Service: 04/19/2015 10:00 AM Medical Record Number: [id number] Patient Account Number: [id number] Date of Birth/Sex: 03/10/1956 (59 y.o. Female) Treating RN: Primary Care Physician: [name] Other Clinician: Jacqulyn Bath Referring Physician: [name] Treating Physician/Extender: BURNS III, Charlean Sanfilippo in Treatment: 14 Visit Information History Since Last Visit Added or deleted any medications: No Patient Arrived: Wheel Chair Any new allergies or adverse reactions: No Arrival Time: 09:52 Had a fall or experienced change in No Accompanied By: husband activities of daily living that may affect Transfer Assistance: Manual risk of falls: Patient Identification Verified: Yes Signs or symptoms of abuse/neglect No Secondary Verification Process Yes since last visito Completed: Hospitalized since last visit: No Patient Requires Transmission- No Has Dressing in Place as Prescribed: Yes Based Precautions: Has Footwear/Offloading in Place as Yes Patient Has Alerts: Yes Prescribed: Patient Alerts: Patient on Blood Left: Surgical Shoe with Thinner Pressure Relief Insole ABI L:0.8, Pain Present Now: No R:0.93 Electronic Signature(s) Signed: 04/19/2015 12:15:44 PM By: Lorine Bears RCP, RRT, CHT Entered By: Lorine Bears on 04/19/2015 09:52:29 [name] ([id number]) -------------------------------------------------------------------------------- Encounter Discharge Information Details Patient Name: [name] Date of Service: 04/19/2015 10:00 AM Medical Record Number: [id number] Patient Account Number: [id number] Date of Birth/Sex: 11/01/1955 (59 y.o. Female) Treating RN: Primary Care Physician: [name] Other Clinician: Jacqulyn Bath Referring Physician: [name] Treating  Physician/Extender: BURNS III, Charlean Sanfilippo in Treatment: 14 Encounter Discharge Information Items Discharge Pain Level: 0 Discharge Condition: Stable Ambulatory Status: Wheelchair Discharge Destination: Home Private Transportation: Auto Accompanied By: husband Schedule Follow-up Appointment: No Medication Reconciliation completed and No provided to Patient/Care Jaquel Glassburn: Clinical Summary of Care: Notes Patient has an HBO treatment scheduled on 04/20/15 at 10:00 am. Electronic Signature(s) Signed: 04/19/2015 12:15:44 PM By: Lorine Bears RCP, RRT, CHT Entered By: Lorine Bears on 04/19/2015 12:15:25 [name] ([id number]) -------------------------------------------------------------------------------- Vitals Details Patient Name: [name] Date of Service: 04/19/2015 10:00 AM Medical Record Number: [id number] Patient Account Number: [id number] Date of Birth/Sex: [birth year] (59 y.o. Female) Treating RN: Primary Care Physician: [name] Other Clinician: Jacqulyn Bath Referring Physician: [name] Treating Physician/Extender: BURNS III, Charlean Sanfilippo in Treatment: 14 Vital Signs Time Taken: 09:37 Temperature (F): 98.2 Height (in): 68 Pulse (bpm): 84 Weight (lbs): 257 Respiratory Rate (breaths/min): 18 Body Mass Index (BMI): 39.1 Blood Pressure (mmHg): 92/54 Capillary Blood Glucose (mg/dl): 185 Reference Range: 80 - 120 mg / dl Electronic Signature(s) Signed: 04/19/2015 12:15:44 PM By: Lorine Bears RCP, RRT, CHT Entered By: Lorine Bears on 04/19/2015 09:53:30

## 2015-04-20 ENCOUNTER — Encounter: Payer: PPO | Admitting: Surgery

## 2015-04-20 ENCOUNTER — Other Ambulatory Visit
Admission: RE | Admit: 2015-04-20 | Discharge: 2015-04-20 | Disposition: A | Discharge status: Home / Self Care | Payer: PPO | Source: Ambulatory Visit | Attending: Surgery | Admitting: Surgery

## 2015-04-20 DIAGNOSIS — L02611 Cutaneous abscess of right foot: Secondary | ICD-10-CM | POA: Insufficient documentation

## 2015-04-20 DIAGNOSIS — L97422 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 layer exposed: Secondary | ICD-10-CM | POA: Diagnosis not present

## 2015-04-20 LAB — GLUCOSE, CAPILLARY
GLUCOSE-CAPILLARY: 217 mg/dL — AB (ref 65–99)
Glucose-Capillary: 168 mg/dL — ABNORMAL HIGH (ref 65–99)

## 2015-04-20 NOTE — Progress Notes (Signed)
AZAYA, MCCOSKEY ([id number]) Visit Report for 04/20/2015 HBO Details Patient Name: [name], [name] Date of Service: 04/20/2015 10:00 AM Medical Record Number: [id number] Patient Account Number: [id number] Date of Birth/Sex: 04-01-1956 (59 y.o. 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 HBO Treatment Details Treatment Number: 7 Patient Type: Outpatient Chamber Type: Monoplace Chamber #: [id number] Treatment Protocol: 2.0 ATA with 90 minutes oxygen, and no air breaks Treatment Details Compression Rate Down: 1.5 psi / minute De-Compression Rate Up: 1.5 psi / minute Air breaks and breathing Compress Tx Pressure Decompress Decompress periods Begins Reached Begins Ends (leave unused spaces blank) Chamber Pressure 1 ATA 2.0 ATA - - - - - - 2.0 ATA 1 ATA Clock Time (24 hr) 10:09 10:21 - - - - - - 11:51 12:02 Treatment Length: 113 (minutes) Treatment Segments: 4 Capillary Blood Glucose Pre Capillary Blood Glucose (mg/dl): Post Capillary Blood Glucose (mg/dl): Vital Signs Capillary Blood Glucose Reference Range: 80 - 120 mg / dl HBO Diabetic Blood Glucose Intervention Range: <131 mg/dl or >249 mg/dl Time Vitals Blood Respiratory Capillary Blood Glucose Pulse Action Type: Pulse: Temperature: Taken: Pressure: Rate: Glucose (mg/dl): Meter #: Oximetry (%) Taken: Pre 09:44 112/68 84 18 97.8 217 1 none Post 12:08 126/64 78 18 97.8 168 1 none Treatment Response Well [name], [name] ([id number]) Treatment Toleration: Treatment Treatment Completed without Adverse Event Completion Status: HBO  Attestation I certify that I supervised this HBO treatment in accordance with Medicare guidelines. A trained Yes emergency response team is readily available per hospital policies and procedures. Continue HBOT as ordered. Yes Electronic Signature(s) Signed: 04/20/2015 12:17:04 PM By: Christin Fudge MD, FACS Previous Signature: 04/20/2015 12:16:29 PM Version By: Lorine Bears RCP, RRT, CHT Entered By: Christin Fudge on 04/20/2015 12:17:04 [name] ([id number]) -------------------------------------------------------------------------------- HBO Safety Checklist Details Patient Name: [name] Date of Service: 04/20/2015 10:00 AM Medical Record Number: [id number] Patient Account Number: [id number] Date of Birth/Sex: 02-27-1956 (59 y.o. Female) Treating RN: Primary Care Physician: [name] Other Clinician: Jacqulyn Bath Referring Physician: [name] Treating Physician/Extender: Frann Rider in Treatment: 14 HBO Safety Checklist Items Safety Checklist Consent Form Signed Patient voided / foley secured and emptied When did you last eato 19:00 pm 04/19/15 Last dose of injectable or oral agent 08:00 am Ostomy pouch emptied and vented if applicable n/a All implantable devices assessed, documented and approved ICD Intravenous access site secured and place n/a Valuables secured Linens and cotton and cotton/polyester blend (less than 51% polyester) Personal oil-based products / skin lotions / body lotions removed Wigs or hairpieces removed Smoking or tobacco materials removed Books / newspapers / magazines / loose paper removed Cologne, aftershave, perfume and deodorant removed Jewelry removed (may wrap wedding band) Make-up removed Hair care products removed Battery operated devices (external) removed Heating patches and chemical warmers removed Titanium eyewear removed n/a Nail polish cured greater than 10 hours n/a Casting material  cured greater than 10 hours n/a Hearing aids removed n/a Loose dentures or partials removed n/a Prosthetics have been removed n/a Patient demonstrates correct use of air break device (if applicable) Patient concerns have been addressed Patient grounding bracelet on and cord attached to chamber Specifics for Inpatients (complete in addition to above) Medication sheet  sent with patient Intravenous medications needed or due during therapy sent with patient [name], [name] ([id number]) Drainage tubes (e.g. nasogastric tube or chest tube secured and vented) Endotracheal or Tracheotomy tube secured Cuff deflated of air and inflated with saline Airway suctioned Electronic Signature(s) Signed: 04/20/2015 12:16:29 PM By: Lorine Bears RCP, RRT, CHT Entered By: Becky Sax, Amado Nash on 04/20/2015 10:16:48

## 2015-04-20 NOTE — Progress Notes (Signed)
[name], [name] ([id number]) Visit Report for 04/20/2015 Arrival Information Details Patient Name: [name], [name] Date of Service: 04/20/2015 10:00 AM Medical Record Number: [id number] Patient Account Number: [id number] Date of Birth/Sex: Dec 11, 1955 (59 y.o. Female) Treating RN: Primary Care Physician: [name] Other Clinician: Jacqulyn Bath Referring Physician: [name] Treating Physician/Extender: Frann Rider in Treatment: 14 Visit Information History Since Last Visit Added or deleted any medications: No Patient Arrived: Wheel Chair Any new allergies or adverse reactions: No Arrival Time: 09:49 Had a fall or experienced change in No Accompanied By: husband activities of daily living that may affect Transfer Assistance: Manual risk of falls: Patient Identification Verified: Yes Signs or symptoms of abuse/neglect No Secondary Verification Process Yes since last visito Completed: Hospitalized since last visit: No Patient Requires Transmission- No Has Dressing in Place as Prescribed: Yes Based Precautions: Has Footwear/Offloading in Place as Yes Patient Has Alerts: Yes Prescribed: Patient Alerts: Patient on Blood Left: Surgical Shoe with Thinner Pressure Relief ABI L:0.8, Insole R:0.93 Right: Surgical Shoe with Pressure Relief Insole Pain Present Now: No Electronic Signature(s) Signed: 04/20/2015 12:16:29 PM By: Lorine Bears RCP, RRT, CHT Entered By: Lorine Bears on 04/20/2015 09:50:21 [name] ([id number]) -------------------------------------------------------------------------------- Encounter Discharge Information Details Patient Name: [name] Date of Service: 04/20/2015 10:00 AM Medical Record Number: [id number] Patient Account Number: [id number] Date of Birth/Sex: 08-12-1956 (59 y.o. Female) Treating RN: Primary Care Physician: [name] Other Clinician: Jacqulyn Bath Referring  Physician: [name] Treating Physician/Extender: Frann Rider in Treatment: 68 Encounter Discharge Information Items Discharge Pain Level: 0 Discharge Condition: Stable Ambulatory Status: Wheelchair Discharge Destination: Home Private Transportation: Auto Accompanied By: husband Schedule Follow-up Appointment: No Medication Reconciliation completed and No provided to Patient/Care Roderick Calo: Clinical Summary of Care: Notes Patient has an HBO treatment scheduled on 04/21/15 at 10:00 am. Electronic Signature(s) Signed: 04/20/2015 12:16:29 PM By: Lorine Bears RCP, RRT, CHT Entered By: Lorine Bears on 04/20/2015 12:16:16 [name] ([id number]) -------------------------------------------------------------------------------- Vitals Details Patient Name: [name] Date of Service: 04/20/2015 10:00 AM Medical Record Number: [id number] Patient Account Number: [id number] Date of Birth/Sex: 01/31/1956 (59 y.o. Female) Treating RN: Primary Care Physician: [name] Other Clinician: Jacqulyn Bath Referring Physician: [name] Treating Physician/Extender: Frann Rider in Treatment: 14 Vital Signs Time Taken: 09:44 Temperature (F): 97.8 Height (in): 68 Pulse (bpm): 84 Weight (lbs): 257 Respiratory Rate (breaths/min): 18 Body Mass Index (BMI): 39.1 Blood Pressure (mmHg): 112/68 Capillary Blood Glucose (mg/dl): 217 Reference Range: 80 - 120 mg / dl Electronic Signature(s) Signed: 04/20/2015 12:16:29 PM By: Lorine Bears RCP, RRT, CHT Entered By: Becky Sax, Amado Nash on 04/20/2015 10:15:07

## 2015-04-20 NOTE — Progress Notes (Addendum)
Type II Diabetes] [7:Anemia, Arrhythmia, Hypertension, Type II  Diabetes] Date Acquired: [3:11/07/2014] [4:02/21/2015] [7:03/10/2015] Weeks of Treatment: [3:13] [4:6] [7:5] Wound Status: [3:Open] [4:Open] [7:Open] Measurements L x W x D 0.8x0.7x0.1 [4:1x0.6x0.1] [7:0.4x0.5x0.5] (cm) Area (cm) : [3:0.44] [4:0.471] [7:0.157] Volume (cm) : [3:0.044] [4:0.047] [7:0.079] % Reduction in Area: [3:20.00%] [4:91.30%] [7:80.80%] % Reduction in Volume: 60.00% [4:91.30%] [7:3.70%] Classification: [3:Category/Stage III] [4:Category/Stage III] [7:Grade 1] Exudate Amount: [3:None Present] [4:Small] [7:Medium] Exudate Type: [3:N/A] [4:Serous] [7:Serosanguineous] Exudate Color: [3:N/A] [4:amber] [7:red, brown] Wound Margin: [3:Distinct, outline attached Distinct, outline attached Distinct, outline attached] Granulation Amount: [3:None Present (0%)] [4:Large (67-100%)] [7:Medium (34-66%)] Granulation Quality: [3:N/A] [4:Pink] [7:Red] Necrotic Amount: [3:Large  (67-100%)] [4:None Present (0%)] [7:Small (1-33%)] Necrotic Tissue: [3:Eschar] [4:N/A] [7:Adherent Slough] Exposed Structures: [3:Fascia: No Fat: No Tendon: No] [4:Fascia: No Fat: No Tendon: No] [7:Fascia: No Fat: No Tendon: No] Muscle: No Muscle: No Muscle: No Joint: No Joint: No Joint: No Bone: No Bone: No Bone: No Limited to Skin Limited to Skin Limited to Skin Breakdown Breakdown Breakdown Epithelialization: None Medium (34-66%) Small (1-33%) Periwound Skin Texture: Edema: No Edema: No Edema: No Excoriation: No Excoriation: No Excoriation: No Induration: No Induration: No Induration: No Callus: No Callus: No Callus: No Crepitus: No Crepitus: No Crepitus: No Fluctuance: No Fluctuance: No Fluctuance: No Friable: No Friable: No Friable: No Rash: No Rash: No Rash: No Scarring: No Scarring: No Scarring: No Periwound Skin Dry/Scaly: Yes Moist: Yes Moist: Yes Moisture: Maceration: No Maceration: No Maceration: No Moist: No Dry/Scaly: No Dry/Scaly: No Periwound Skin Color: Atrophie Blanche: No Atrophie Blanche: No Atrophie Blanche: No Cyanosis: No Cyanosis: No Cyanosis: No Ecchymosis: No Ecchymosis: No Ecchymosis: No Erythema: No Erythema: No Erythema: No Hemosiderin Staining: No Hemosiderin Staining: No Hemosiderin Staining: No Mottled: No Mottled: No Mottled: No Pallor: No Pallor: No Pallor: No Rubor: No Rubor: No Rubor: No Temperature: No Abnormality N/A No Abnormality Tenderness on No No Yes Palpation: Wound Preparation: Ulcer Cleansing: Ulcer Cleansing: Ulcer Cleansing: Rinsed/Irrigated with Rinsed/Irrigated with Rinsed/Irrigated with Saline Saline Saline Topical Anesthetic Topical Anesthetic Applied: None Applied: Other: lidocaine 4% Procedures Performed: N/A N/A Incision and Drainage Treatment Notes Electronic Signature(s) Signed: 04/20/2015 9:23:56 AM By: Regan Lemming BSN, RN Entered By: Regan Lemming on 04/20/2015  09:23:56 [name] ([id number]) -------------------------------------------------------------------------------- [name] Date of Service: 04/20/2015 8:45 AM Medical Record Number: [id number] Patient Account Number: [id number] Date of Birth/Sex: Sep 01, 1956 (59 y.o. Female) Treating RN: Afful, RN, BSN, Velva Harman Primary Care Physician: [name] Other Clinician: Referring Physician: [name] Treating Physician/Extender: Frann Rider in Treatment: 20 Active Inactive Abuse / Safety / Falls / Self Care Management Nursing Diagnoses: Impaired physical mobility Potential for falls Self care deficit: actual or potential Goals: Patient/caregiver will verbalize understanding of skin care regimen Date Initiated: 01/10/2015 Goal Status: Active Patient/caregiver will verbalize/demonstrate measures taken to improve the patient's personal safety Date Initiated: 01/10/2015 Goal Status: Active Patient/caregiver will verbalize/demonstrate measures taken to prevent injury and/or falls Date Initiated: 01/10/2015 Goal Status: Active Patient/caregiver will verbalize/demonstrate understanding of what to do in case of emergency Date Initiated: 01/10/2015 Goal Status: Active Interventions: Assess fall risk on admission and as needed Assess impairment of mobility on admission and as needed per policy Provide education on basic hygiene Provide education on fall prevention Provide education on personal and home safety Provide education on safe transfers Treatment Activities: Education provided on Basic Hygiene : 01/10/2015 Notes: Nutrition [name] ([id number]) Nursing Diagnoses: Impaired glucose control: actual or  [name], [name] ([id number]) Visit Report for 04/20/2015 Arrival Information Details Patient Name: [name] Date of Service: 04/20/2015 8:45 AM Medical Record Number: [id number] Patient Account Number: [id number] Date of Birth/Sex: 28-Dec-1955 (59 y.o. Female) Treating RN: Afful, RN, BSN, Velva Harman Primary Care Physician: [name] Other Clinician: Referring Physician: [name] Treating Physician/Extender: Frann Rider in Treatment: 14 Visit Information History Since Last Visit Any new allergies or adverse reactions: No Patient Arrived: Wheel Chair Had a fall or experienced change in No Arrival Time: 08:59 activities of daily living that may affect Accompanied By: husband risk of falls: Transfer Assistance: Manual Signs or symptoms of abuse/neglect since last No Patient Identification Verified: Yes visito Secondary Verification Process Yes Hospitalized since last visit: No Completed: Has Dressing in Place as Prescribed: Yes Patient Requires Transmission- No Pain Present Now: No Based Precautions: Patient Has Alerts: Yes Patient Alerts: Patient on Blood Thinner ABI L:0.8, R:0.93 Electronic Signature(s) Signed: 04/20/2015 8:59:23 AM By: Regan Lemming BSN, RN Entered By: Regan Lemming on 04/20/2015 08:59:23 [name] ([id number]) -------------------------------------------------------------------------------- Encounter Discharge Information Details Patient Name: [name] Date of Service: 04/20/2015 8:45 AM Medical Record Number: [id number] Patient Account Number: [id number] Date of Birth/Sex: December 21, 1955 (59 y.o. Female) Treating RN: Afful, RN, BSN, Velva Harman Primary Care Physician: [name] Other Clinician: Referring Physician: [name] Treating Physician/Extender: Frann Rider in Treatment: 41 Encounter Discharge Information Items Discharge Pain Level: 0 Discharge Condition: Stable Ambulatory Status: Wheelchair Other  (Note Discharge Destination: Required) Transportation: Other Accompanied By: Micheline Rough Schedule Follow-up Appointment: No Medication Reconciliation completed and provided to Patient/Care No Joseantonio Dittmar: Clinical Summary of Care: Notes After wound care, patient was escorted to the HBO room for treatment. Electronic Signature(s) Signed: 04/20/2015 5:42:40 PM By: Regan Lemming BSN, RN Entered By: Regan Lemming on 04/20/2015 12:23:18 [name] ([id number]) -------------------------------------------------------------------------------- Lower Extremity Assessment Details Patient Name: [name] Date of Service: 04/20/2015 8:45 AM Medical Record Number: [id number] Patient Account Number: [id number] Date of Birth/Sex: 11/04/1955 (59 y.o. Female) Treating RN: Afful, RN, BSN, Velva Harman Primary Care Physician: [name] Other Clinician: Referring Physician: [name] Treating Physician/Extender: Frann Rider in Treatment: 14 Edema Assessment Assessed: [Left: No] [Right: No] Edema: [Left: N] [Right: o] Vascular Assessment Claudication: Claudication Assessment [Right:None] Pulses: Posterior Tibial Dorsalis Pedis Palpable: [Right:Yes] Extremity colors, hair growth, and conditions: Extremity Color: [Right:Normal] Hair Growth on Extremity: [Right:Yes] Temperature of Extremity: [Right:Warm] Capillary Refill: [Right:< 3 seconds] Dependent Rubor: [Right:No] Blanched when Elevated: [Right:No] Lipodermatosclerosis: [Right:No] Toe Nail Assessment Left: Right: Thick: No Discolored: No Deformed: No Improper Length and Hygiene: No Electronic Signature(s) Signed: 04/20/2015 9:10:33 AM By: Regan Lemming BSN, RN Entered By: Regan Lemming on 04/20/2015 09:10:33 [name] ([id number]) -------------------------------------------------------------------------------- Multi Wound Chart Details Patient Name: [name] Date of Service: 04/20/2015 8:45 AM Medical  Record Number: [id number] Patient Account Number: [id number] Date of Birth/Sex: 1956/08/15 (59 y.o. 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 Female) Treating RN: Afful, RN, BSN, Velva Harman Primary Care Physician: [name] Other Clinician: Referring Physician: [name] Treating Physician/Extender: Frann Rider in Treatment: 14 Visit Information History Since Last Visit Any new allergies or adverse reactions: No Patient Arrived: Wheel Chair Had a fall or experienced change in No Arrival Time: 08:59 activities of daily living that may affect Accompanied By: husband risk of falls: Transfer Assistance: Manual Signs or symptoms of abuse/neglect since last No Patient Identification Verified: Yes visito Secondary Verification Process Yes Hospitalized since last visit: No Completed: Has Dressing in Place as Prescribed: Yes Patient Requires Transmission- No Pain Present Now: No Based Precautions: Patient Has Alerts: Yes Patient Alerts: Patient on Blood Thinner ABI L:0.8, R:0.93 Electronic Signature(s) Signed: 04/20/2015 8:59:23 AM By: Regan Lemming BSN, RN Entered By: Regan Lemming on 04/20/2015 08:59:23 [name] ([id number]) -------------------------------------------------------------------------------- Encounter Discharge Information Details Patient Name: [name] Date of Service: 04/20/2015 8:45 AM Medical Record Number: [id number] Patient Account Number: [id number] Date of Birth/Sex: December 21, 1955 (59 y.o. Female) Treating RN: Afful, RN, BSN, Velva Harman Primary Care Physician: [name] Other Clinician: Referring Physician: [name] Treating Physician/Extender: Frann Rider in Treatment: 41 Encounter Discharge Information Items Discharge Pain Level: 0 Discharge Condition: Stable Ambulatory Status: Wheelchair Other  (Note Discharge Destination: Required) Transportation: Other Accompanied By: Micheline Rough Schedule Follow-up Appointment: No Medication Reconciliation completed and provided to Patient/Care No Joseantonio Dittmar: Clinical Summary of Care: Notes After wound care, patient was escorted to the HBO room for treatment. Electronic Signature(s) Signed: 04/20/2015 5:42:40 PM By: Regan Lemming BSN, RN Entered By: Regan Lemming on 04/20/2015 12:23:18 [name] ([id number]) -------------------------------------------------------------------------------- Lower Extremity Assessment Details Patient Name: [name] Date of Service: 04/20/2015 8:45 AM Medical Record Number: [id number] Patient Account Number: [id number] Date of Birth/Sex: 11/04/1955 (59 y.o. Female) Treating RN: Afful, RN, BSN, Velva Harman Primary Care Physician: [name] Other Clinician: Referring Physician: [name] Treating Physician/Extender: Frann Rider in Treatment: 14 Edema Assessment Assessed: [Left: No] [Right: No] Edema: [Left: N] [Right: o] Vascular Assessment Claudication: Claudication Assessment [Right:None] Pulses: Posterior Tibial Dorsalis Pedis Palpable: [Right:Yes] Extremity colors, hair growth, and conditions: Extremity Color: [Right:Normal] Hair Growth on Extremity: [Right:Yes] Temperature of Extremity: [Right:Warm] Capillary Refill: [Right:< 3 seconds] Dependent Rubor: [Right:No] Blanched when Elevated: [Right:No] Lipodermatosclerosis: [Right:No] Toe Nail Assessment Left: Right: Thick: No Discolored: No Deformed: No Improper Length and Hygiene: No Electronic Signature(s) Signed: 04/20/2015 9:10:33 AM By: Regan Lemming BSN, RN Entered By: Regan Lemming on 04/20/2015 09:10:33 [name] ([id number]) -------------------------------------------------------------------------------- Multi Wound Chart Details Patient Name: [name] Date of Service: 04/20/2015 8:45 AM Medical  Record Number: [id number] Patient Account Number: [id number] Date of Birth/Sex: 1956/08/15 (59 y.o. 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 ([id number]) Muscle Exposed: No Joint Exposed: No Bone Exposed: No Limited to Skin Breakdown Periwound Skin Texture Texture Color No Abnormalities Noted: No No Abnormalities Noted: No Callus: No Atrophie Blanche: No Crepitus: No Cyanosis: No Excoriation: No Ecchymosis: No Fluctuance:  No Erythema: No Friable: No Hemosiderin Staining: No Induration: No Mottled: No Localized Edema: No Pallor: No Rash: No Rubor: No Scarring: No Moisture No Abnormalities Noted: No Dry / Scaly: No Maceration: No Moist: Yes Wound Preparation Ulcer Cleansing: Rinsed/Irrigated with Saline Treatment Notes Wound #4 (Medial Coccyx) 3. Peri-wound Care: Skin Prep 4. Dressing Applied: Prisma Ag 5. Secondary Dressing Applied Bordered Foam Dressing Notes BFD on sacrum Electronic Signature(s) Signed: 04/20/2015 9:12:23 AM By: Regan Lemming BSN, RN Entered By: Regan Lemming on 04/20/2015 09:12:23 [name] ([id number]) -------------------------------------------------------------------------------- Wound Assessment Details Patient Name: [name] Date of Service: 04/20/2015 8:45 AM Medical Record Number: [id number] Patient Account Number: [id number] Date of Birth/Sex: April 18, 1956 (59 y.o. Female) Treating RN: Afful, RN, BSN, Winifred Primary Care Physician: [name] Other Clinician: Referring Physician: [name] Treating Physician/Extender: Frann Rider in Treatment: 14 Wound Status Wound Number: 7 Primary Diabetic Wound/Ulcer of the Lower Etiology: Extremity Wound Location: Right Toe Second Wound Open Wounding Event: Gradually Appeared Status: Date Acquired: 03/10/2015 Comorbid Anemia, Arrhythmia, Congestive Heart Weeks Of Treatment: 5 History: Failure, Hypertension, Type II Diabetes Clustered Wound: No Photos Photo Uploaded By: Regan Lemming on 04/20/2015 17:33:03 Wound Measurements Length: (cm) 0.4 Width: (cm) 0.5 Depth: (cm) 0.5 Area: (cm) 0.157 Volume: (cm) 0.079 % Reduction in Area: 80.8% % Reduction in Volume: 3.7% Epithelialization: Small (1-33%) Tunneling: No Undermining: No Wound Description Classification: Grade 1 Wound Margin: Distinct, outline attached Exudate Amount: Medium Exudate Type: Serosanguineous Exudate Color: red,  brown Foul Odor After Cleansing: No Wound Bed Granulation Amount: Medium (34-66%) Exposed Structure Granulation Quality: Red Fascia Exposed: No Necrotic Amount: Small (1-33%) Fat Layer Exposed: No Necrotic Quality: Adherent Slough Tendon Exposed: No ZANILAH, DRAPKIN. ([id number]) Muscle Exposed: No Joint Exposed: No Bone Exposed: No Limited to Skin Breakdown Periwound Skin Texture Texture Color No Abnormalities Noted: No No Abnormalities Noted: No Callus: No Atrophie Blanche: No Crepitus: No Cyanosis: No Excoriation: No Ecchymosis: No Fluctuance: No Erythema: No Friable: No Hemosiderin Staining: No Induration: No Mottled: No Localized Edema: No Pallor: No Rash: No Rubor: No Scarring: No Temperature / Pain Moisture Temperature: No Abnormality No Abnormalities Noted: No Tenderness on Palpation: Yes Dry / Scaly: No Maceration: No Moist: Yes Wound Preparation Ulcer Cleansing: Rinsed/Irrigated with Saline Topical Anesthetic Applied: Other: lidocaine 4%, Treatment Notes Wound #7 (Right Toe Second) 1. Cleansed with: Clean wound with Normal Saline 4. Dressing Applied: Aquacel Ag 5. Secondary Dressing Applied Gauze and Kerlix/Conform 7. Secured with Tape Notes BFD on sacrum, G/K on toe Electronic Signature(s) Signed: 04/20/2015 9:09:21 AM By: Regan Lemming BSN, RN Entered By: Regan Lemming on 04/20/2015 09:09:21 [name] ([id number]) -------------------------------------------------------------------------------- Vitals Details Patient Name: [name] Date of Service: 04/20/2015 8:45 AM Medical Record Number: [id number] Patient Account Number: [id number] Date of Birth/Sex: Jan 12, 1956 (59 y.o. Female) Treating RN: Baruch Gouty, RN, BSN, Velva Harman Primary Care Physician: [name] Other Clinician: Referring Physician: [name] Treating Physician/Extender: Frann Rider in Treatment: 14 Vital Signs Time Taken: 09:04 Temperature (F):  97.8 Height (in): 68 Pulse (bpm): 86 Weight (lbs): 257 Respiratory Rate (breaths/min): 18  potential Potential for alteratiion in Nutrition/Potential for imbalanced nutrition Goals: Patient/caregiver agrees to and verbalizes understanding of need to use nutritional supplements and/or vitamins as  prescribed Date Initiated: 01/10/2015 Goal Status: Active Interventions: Assess HgA1c results as ordered upon admission and as needed Treatment Activities: Patient referred to Primary Care Physician for further nutritional evaluation : 04/20/2015 Notes: Orientation to the Wound Care Program Nursing Diagnoses: Knowledge deficit related to the wound healing center program Goals: Patient/caregiver will verbalize understanding of the [facility] Program Date Initiated: 01/10/2015 Goal Status: Active Interventions: Provide education on orientation to the wound center Notes: Osteomyelitis Nursing Diagnoses: Potential for infection: osteomyelitis Goals: Diagnostic evaluation for osteomyelitis completed as ordered Date Initiated: 01/17/2015 Goal Status: Active Patient/caregiver will verbalize understanding of disease process and disease management Date Initiated: 01/17/2015 Goal Status: Active Signs and symptoms for osteomyelitis will be recognized and promptly addressed [name], [name] ([id number]) Date Initiated: 01/17/2015 Goal Status: Active Interventions: Assess for signs and symptoms of osteomyelitis resolution every visit Provide education on osteomyelitis Treatment Activities: MRI : 04/20/2015 Test ordered outside of clinic : 04/20/2015 X-ray : 04/20/2015 Notes: Soft Tissue Infection Nursing Diagnoses: Knowledge deficit related to disease process and management Potential for infection: soft tissue Goals: Patient/caregiver will verbalize understanding of or measures to prevent infection and contamination in the home setting Date Initiated: 01/17/2015 Goal Status: Active Patient's soft tissue infection will resolve Date Initiated: 01/17/2015 Goal Status: Active Signs and symptoms of infection will be recognized early to allow for prompt treatment Date Initiated: 01/17/2015 Goal Status: Active Interventions: Assess signs and symptoms of infection every  visit Provide education on infection Screen for HBO Treatment Activities: Culture : 04/20/2015 Culture and sensitivity : 04/20/2015 Systemic antibiotics : 04/20/2015 Test ordered outside of clinic : 04/20/2015 Notes: Wound/Skin Impairment [name], [name] ([id number]) Nursing Diagnoses: Impaired tissue integrity Knowledge deficit related to ulceration/compromised skin integrity Goals: Patient/caregiver will verbalize understanding of skin care regimen Date Initiated: 01/10/2015 Goal Status: Active Ulcer/skin breakdown will have a volume reduction of 30% by week 4 Date Initiated: 01/10/2015 Goal Status: Active Ulcer/skin breakdown will have a volume reduction of 50% by week 8 Date Initiated: 01/10/2015 Goal Status: Active Ulcer/skin breakdown will have a volume reduction of 80% by week 12 Date Initiated: 01/10/2015 Goal Status: Active Ulcer/skin breakdown will heal within 14 weeks Date Initiated: 01/10/2015 Goal Status: Active Interventions: Assess patient/caregiver ability to obtain necessary supplies Assess patient/caregiver ability to perform ulcer/skin care regimen upon admission and as needed Assess ulceration(s) every visit Provide education on smoking Provide education on ulcer and skin care Treatment Activities: Consult for HBO : 04/20/2015 Skin care regimen initiated : 04/20/2015 Topical wound management initiated : 04/20/2015 Notes: Electronic Signature(s) Signed: 04/20/2015 9:23:49 AM By: Regan Lemming BSN, RN Entered By: Regan Lemming on 04/20/2015 09:23:49 [name] ([id number]) -------------------------------------------------------------------------------- Pain Assessment Details Patient Name: [name] Date of Service: 04/20/2015 8:45 AM Medical Record Number: [id number] Patient Account Number: [id number] Date of Birth/Sex: 1955/12/29 (59 y.o. Female) Treating RN: Baruch Gouty, RN, BSN, Velva Harman Primary Care Physician: [name] Other Clinician: Referring  Physician: [name] Treating Physician/Extender: Frann Rider in Treatment: 14 Active Problems Location of Pain Severity and Description of Pain Patient Has Paino No Site Locations Pain Management and Medication Current Pain Management: Electronic Signature(s) Signed: 04/20/2015 8:59:30 AM By: Regan Lemming BSN, RN Entered By: Regan Lemming on 04/20/2015 08:59:30 [name] ([id number]) -------------------------------------------------------------------------------- Patient/Caregiver Education Details Patient Name: [name] Date of Service: 04/20/2015 8:45 AM Medical Record  [name], [name] ([id number]) Visit Report for 04/20/2015 Arrival Information Details Patient Name: [name] Date of Service: 04/20/2015 8:45 AM Medical Record Number: [id number] Patient Account Number: [id number] Date of Birth/Sex: 28-Dec-1955 (59 y.o. Female) Treating RN: Afful, RN, BSN, Velva Harman Primary Care Physician: [name] Other Clinician: Referring Physician: [name] Treating Physician/Extender: Frann Rider in Treatment: 14 Visit Information History Since Last Visit Any new allergies or adverse reactions: No Patient Arrived: Wheel Chair Had a fall or experienced change in No Arrival Time: 08:59 activities of daily living that may affect Accompanied By: husband risk of falls: Transfer Assistance: Manual Signs or symptoms of abuse/neglect since last No Patient Identification Verified: Yes visito Secondary Verification Process Yes Hospitalized since last visit: No Completed: Has Dressing in Place as Prescribed: Yes Patient Requires Transmission- No Pain Present Now: No Based Precautions: Patient Has Alerts: Yes Patient Alerts: Patient on Blood Thinner ABI L:0.8, R:0.93 Electronic Signature(s) Signed: 04/20/2015 8:59:23 AM By: Regan Lemming BSN, RN Entered By: Regan Lemming on 04/20/2015 08:59:23 [name] ([id number]) -------------------------------------------------------------------------------- Encounter Discharge Information Details Patient Name: [name] Date of Service: 04/20/2015 8:45 AM Medical Record Number: [id number] Patient Account Number: [id number] Date of Birth/Sex: December 21, 1955 (59 y.o. Female) Treating RN: Afful, RN, BSN, Velva Harman Primary Care Physician: [name] Other Clinician: Referring Physician: [name] Treating Physician/Extender: Frann Rider in Treatment: 41 Encounter Discharge Information Items Discharge Pain Level: 0 Discharge Condition: Stable Ambulatory Status: Wheelchair Other  (Note Discharge Destination: Required) Transportation: Other Accompanied By: Micheline Rough Schedule Follow-up Appointment: No Medication Reconciliation completed and provided to Patient/Care No Joseantonio Dittmar: Clinical Summary of Care: Notes After wound care, patient was escorted to the HBO room for treatment. Electronic Signature(s) Signed: 04/20/2015 5:42:40 PM By: Regan Lemming BSN, RN Entered By: Regan Lemming on 04/20/2015 12:23:18 [name] ([id number]) -------------------------------------------------------------------------------- Lower Extremity Assessment Details Patient Name: [name] Date of Service: 04/20/2015 8:45 AM Medical Record Number: [id number] Patient Account Number: [id number] Date of Birth/Sex: 11/04/1955 (59 y.o. Female) Treating RN: Afful, RN, BSN, Velva Harman Primary Care Physician: [name] Other Clinician: Referring Physician: [name] Treating Physician/Extender: Frann Rider in Treatment: 14 Edema Assessment Assessed: [Left: No] [Right: No] Edema: [Left: N] [Right: o] Vascular Assessment Claudication: Claudication Assessment [Right:None] Pulses: Posterior Tibial Dorsalis Pedis Palpable: [Right:Yes] Extremity colors, hair growth, and conditions: Extremity Color: [Right:Normal] Hair Growth on Extremity: [Right:Yes] Temperature of Extremity: [Right:Warm] Capillary Refill: [Right:< 3 seconds] Dependent Rubor: [Right:No] Blanched when Elevated: [Right:No] Lipodermatosclerosis: [Right:No] Toe Nail Assessment Left: Right: Thick: No Discolored: No Deformed: No Improper Length and Hygiene: No Electronic Signature(s) Signed: 04/20/2015 9:10:33 AM By: Regan Lemming BSN, RN Entered By: Regan Lemming on 04/20/2015 09:10:33 [name] ([id number]) -------------------------------------------------------------------------------- Multi Wound Chart Details Patient Name: [name] Date of Service: 04/20/2015 8:45 AM Medical  Record Number: [id number] Patient Account Number: [id number] Date of Birth/Sex: 1956/08/15 (59 y.o. 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

## 2015-04-20 NOTE — Progress Notes (Signed)
[name], [name] ([id number]) Visit Report for 04/20/2015 Chief Complaint Document Details Patient Name: [name] Date of Service: 04/20/2015 8:45 AM Medical Record Number: [id number] Patient Account Number: [id number] Date of Birth/Sex: [name] (59 y.o. Female) Treating RN: [name] Primary Care Physician: [name] Other Clinician: Referring Physician: Reginia [name] Treating Physician/Extender: [name] in Treatment: 14 Information Obtained from: Patient Chief Complaint Patient presents to the wound care center for a consult due non healing wound 59 year old patient who comes with a history of an ulcer on the left second toe for at least about 4 months and a history on her left heel for about 3 weeks. I needed to get an MRI of her left foot but due to her defibrillator this could not be done and hence she is getting get a triple phase bone scan. 01/17/2015 -- she has developed a significant injury to the bridge of the nose where her CPAP machine was fitting. This is a painfull dark spot. Electronic Signature(s) Signed: 04/20/2015 9:35:26 AM By: [name] Entered By: [name] on 04/20/2015 09:35:26 [name] ([id number]) -------------------------------------------------------------------------------- HPI Details Patient Name: [name] Date of Service: 04/20/2015 8:45 AM Medical Record Number: [id number] Patient Account Number: [id number] Date of Birth/Sex: [name] (59 y.o. Female) Treating RN: [name] Primary Care Physician: [name] Other Clinician: Referring Physician: Reginia [name] Treating Physician/Extender: [name] in Treatment: 14 History of Present Illness HPI Description: 59 year old patient who is known to have diabetes for several years and generally has it under good control last hemoglobin A1c was 6.7 done 2 weeks ago. She has several other  comorbidities including acute CHF, hypoxia, hyperlipidemia, systolic heart failure, myocardial infarction, coronary artery disease, iron deficiency anemia, renal insufficiency, essential hypertension, morbid obesity and obstructive sleep apnea. 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 In the past she's had a coronary angioplasty with stent in 2013, laparoscopic cholecystectomy in 2011, bilateral oophorectomy in April 2012 and she's had some lumbar disc surgery in 1996. She says she's had a lot of peripheral neuropathy and due to this she's had a ulcer on her left second toe for a while. She had a dry eschar on her left heel and recently this was removed and she found that she had some infection and some drainage from there. She has minimal pain around the heel but no pain on any areas of her toes. she has not been on any antibiotics recently and has known vascular problems in the recent past. 01/17/2015 left foot x-ray done on April 5 reveals there are destructive changes noted of the distal aspect of the proximal phalanx of the left second digit with extension into the adjacent proximal interphalangeal joint space. These findings suggest osteomyelitis with possible septic arthritis. I was able to make a call to [name] the radiologist, and had a detailed discussion with him regarding this x-ray. He noted that there were also some changes in the fifth phalanx on the left foot and this could have been chronic osteomyelitis too. The second toe on the left leg shows findings which cannot differentiate between an acute or chronic osteomyelitis and hence he has recommended a MRI with and without contrast. We will get this organized for her and in the meanwhile treat her with oral antibiotics which is susceptible to Bactrim. She was seen yesterday by her PCP [name] who put her on 3 antibiotics which include  Levaquin and doxycycline and  Bactrim. 01/24/2015 She is scheduled for a triple PHASE bone scan on 01/26/2015. She is still on antibiotics and otherwise doing well. 01/31/2015 Bone Imaging 3 phase study -- IMPRESSION:1. There is increased uptake on all 3 phases localizing to the left hindfoot region. This may be a manifestation of osteomyelitis. Correlation with exact site of nonhealing ulcer. 2. There is increased uptake localizing to the second phalanx of the left foot on the blood pool and delayed phase images. 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 Underlying infection not excluded. I have personally discussed the x-ray studies with the radiologist [name] and after comparing all her films he agrees that the left second phalanx does not have any definite underlying infection and this is [name], [name]. ([id number]) now off a chronic arthropathy. There is a increased uptake in all 3 phases localized to the left hindfoot region and this is a manifestation of osteomyelitis. 02/07/2015 -- She was seen by the ID specialist [name] who reviewed her on my request recently. He has recommended a PICC line and IV vancomycin and oral ciprofloxacin for 4 weeks. She will be reviewed by him periodically. He also received several notes from [name] to help as documented all the requisites for hyperbaric oxygen therapy. 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 He also had a coronary angioplasty with stent placement in October 2013. Prior to that her 2-D echo showed EF of 35%. Her latest 2-D echo done on 11/01/2014 showed a LV EF of 50-55%. Systolic function and wall motion were within normal limits. Chest x-ray done in January 16, 2015 showed no acute findings. Last hemoglobin A1c done on 12/30/2014 was 6.7. CBC done on the same day was within normal limits, except for a hemoglobin  being 8.7 and a hematocrit between 29%. She had arterial duplex study done in August 2015 which showed: No evidence of segmental lower extremity arterial disease at rest, bilaterally. Normal ABI's, bilaterally. Normal great toe pressures, bilaterally. With all the above in place I believe she is a very good candidate for hyperbaric oxygen therapy and I would recommend this with the usual protocol for 6 weeks. she is going to get her PICC line this Friday and the IV antibiotics and when to start. She is also starting on oral ciprofloxacin. 03/03/2015 - recently hospitalized for anemia. No source identified. Started on amiodarone, contraindication to HBO. Reports left heel feels better. Main complaint is new buttocks ulcers. No fever or chills. Minimal drainage. 03/10/2015 -- There is a new ulceration noticed yesterday on her right second toe at the tip. She also has noticed some ulcerations on her gluteal region and we will need to take a look at this. As far as her medications go she does not know for sure what her dosage of amiodarone is exactly. She will look it up and let me know today. 03/17/2015 -- as far as her dosage of amiodarone she was taking : 02/25/15 - 03/02/15 400 mg per day; 03/03/15 - 03/10/15 200 mg per day. She stopped taking the medication on 03/10/15. She has not yet done the checks x-ray and her PFTs are still pending. Recent Lab work done on 03/13/2015 shows a glucose is 124, K+ was 3.3, her albumen was 2.9, WBC count was 3.4, hemoglobin was 9.3, hematocrit was 30.4 and platelets were 240. C-reactive protein was 33.7 which was  high and the Vancomycin trough was 18.6 which was high. 03/24/2015 -- he saw several doctors since I saw her last and they include her medical oncologist, cardiologist, neurologist and infectious disease doctor. Her PICC line is out and she now is on oral Cipro and doxycycline as per her verbal report. She has completed her pulmonary function test yesterday  and a recent chest x-ray is within normal limits. If the PFTs within normal limits she will be able to restart the hyperbaric oxygen therapy. 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 Underlying interstitial lung disease to be considered. Further recommendations were that of a CT chest and pulmonary consult if indicated. Electronic Signature(s) Signed: 04/20/2015 9:35:34 AM By: [name] [name] ([id number]) Entered By: [name] on 04/20/2015 09:35:34 [name] ([id number]) -------------------------------------------------------------------------------- Incision and Drainage Details Patient Name: [name] Date of Service: 04/20/2015 8:45 AM Medical Record Number: [id number] Patient Account Number: [id number] Date of Birth/Sex: [birth year] (59 y.o. Female) Treating RN: Baruch Gouty, RN, BSN, Velva [name] Primary Care Physician: [name] Other Clinician: Referring Physician: Reginia [name] Treating Physician/Extender: [name] in Treatment: 14 Incision And Drainage Wound #7 Right Toe Second Performed for: Performed By: Physician Pat Patrick., MD Incision And Drainage Abscess Type: Location: right second toe Time-Out Taken: Yes Pain Control: Lidocaine 4% Topical Solution Drainage Of: Purulent Instrument: Forceps, Scissors Bleeding: Moderate Hemostasis Achieved: Pressure Culture Sent: Swab Procedural Pain: 0 Post Procedural Pain: 0 Response to Procedure was tolerated well Treatment: Notes The ulcerated area on the tip of the right second toe now had a subcutaneous tunnel going to the medial part of the distal phalanx of the second toe. This was incised and drained. Electronic Signature(s) Signed: 04/20/2015 9:35:15 AM By: [name] Previous Signature: 04/20/2015 9:22:06 AM Version By: Regan Lemming  BSN, RN Entered By: [name] on 04/20/2015 09:35:15 [name] ([id number]) -------------------------------------------------------------------------------- Physical Exam Details Patient Name: [name] Date of Service: 04/20/2015 8:45 AM Medical Record Number: [id number] Patient Account Number: [id number] Date of Birth/Sex: [birth year] (59 y.o. Female) Treating RN: Baruch Gouty, RN, BSN, Velva [name] Primary Care Physician: [name] Other Clinician: Referring Physician: Reginia [name] Treating Physician/Extender: [name] in Treatment: 14 Constitutional . Pulse regular. Respirations normal and unlabored. Afebrile. . Eyes Nonicteric. Reactive to light. Ears, Nose, Mouth, and Throat Lips, teeth, and gums WNL.Marland Kitchen Moist mucosa without lesions . Neck supple and nontender. No palpable supraclavicular or cervical adenopathy. Normal sized without goiter. Respiratory WNL. No retractions.. Cardiovascular Pedal Pulses WNL. No clubbing, cyanosis or edema. Gastrointestinal (GI) Abdomen without masses or tenderness.. No liver or spleen enlargement or tenderness.. Musculoskeletal Adexa without tenderness or enlargement.. Digits and nails w/o clubbing, cyanosis, infection, petechiae, ischemia, or inflammatory conditions.. Integumentary (Hair, Skin) No suspicious lesions. No crepitus or fluctuance. No peri-wound warmth or erythema. No masses.Marland Kitchen Psychiatric Judgement and insight Intact.. No evidence of depression, anxiety, or agitation.. Notes the left calcaneum is completely healed. The right gluteal area is looking clean and the base is healthy. The right second toe has a Cysts on the medial part of the distal phalanx and this needs an IandD. Electronic Signature(s) Signed: 04/20/2015 9:37:02 AM By: [name] Previous Signature: 04/20/2015 9:36:25 AM Version By: [name] Entered By: [name] on 04/20/2015 09:37:01 [name]  ([id number]) -------------------------------------------------------------------------------- Physician Orders Details Patient Name: [name] Date  of Service: 04/20/2015 8:45 AM Medical Record Number: [id number] Patient Account Number: [id number] Date of Birth/Sex: [birth year][name] (59 y.o. Female) Treating RN: Baruch Gouty, RN, BSN, Velva [name] Primary Care Physician: [name] Other Clinician: Referring Physician: Reginia [name] Treating Physician/Extender: [name] in Treatment: 31 Verbal / Phone Orders: Yes Clinician: Afful, RN, BSN, Rita Read Back and Verified: Yes Diagnosis Coding Wound Cleansing Wound #3 Nose o Clean wound with Normal Saline. o May Shower, gently pat wound dry prior to applying new dressing. o May shower with protection. Wound #4 Medial Coccyx o Clean wound with Normal Saline. o May Shower, gently pat wound dry prior to applying new dressing. o May shower with protection. Wound #7 Right Toe Second o Clean wound with Normal Saline. o May Shower, gently pat wound dry prior to applying new dressing. o May shower with protection. Anesthetic Wound #3 Nose o Topical Lidocaine 4% cream applied to wound bed prior to debridement Wound #4 Medial Coccyx o Topical Lidocaine 4% cream applied to wound bed prior to debridement Wound #7 Right Toe Second o Topical Lidocaine 4% cream applied to wound bed prior to debridement Primary Wound Dressing Wound #3 Nose o Other: - betadine paint Wound #4 Medial Coccyx o Prisma Ag Wound #7 Right Toe Second o Aquacel Ag Secondary Dressing [name], [name] ([id number]) Wound #4 Medial Coccyx o Boardered Foam Dressing Wound #7 Right Toe Second o Gauze and Kerlix/Conform Dressing Change Frequency Wound #3 Nose o Change dressing every other day. Wound #4 Medial Coccyx o Change dressing every other day. Wound #7 Right Toe Second o Change dressing every other day. Follow-up  Appointments Wound #3 Nose o Return Appointment in 1 week. Wound #4 Medial Coccyx o Return Appointment in 1 week. Wound #7 Right Toe Second o Return Appointment in 1 week. Off-Loading Wound #4 Medial Coccyx o Turn and reposition every 2 hours Wound #7 Right Toe Second o Heel suspension boot to: - Sage boots Additional Orders / Instructions Wound #3 Nose o Increase protein intake. o Activity as tolerated Wound #4 Medial Coccyx o Increase protein intake. o Activity as tolerated Wound #7 Right Toe Second o Increase protein intake. o Activity as tolerated Home Health [name], [name] ([id number]) Wound #3 [place] Nurse may visit PRN to address patientos wound care needs. o FACE TO FACE ENCOUNTER: MEDICARE and MEDICAID PATIENTS: I certify that this patient is under my care and that I had a face-to-face encounter that meets the physician face-to-face encounter requirements with this patient on this date. The encounter with the patient was in whole or in part for the following MEDICAL CONDITION: (primary reason for [place]) MEDICAL NECESSITY: I certify, that based on my findings, NURSING services are a medically necessary home health service. HOME BOUND STATUS: I certify that my clinical findings support that this patient is homebound (i.e., Due to illness or injury, pt requires aid of supportive devices such as crutches, cane, wheelchairs, walkers, the use of special transportation or the assistance of another person to leave their place of residence. There is a normal inability to leave the home and doing so requires considerable and taxing effort. Other absences are for medical reasons / religious services and are infrequent or of short duration when for other reasons). o If current dressing causes regression in wound condition, may D/C ordered dressing product/s and apply Normal Saline Moist Dressing  daily until next [place] / Other MD appointment. [place]  of regression in wound condition at [phone]. o Please direct any NON-WOUND related issues/requests for orders to patient's Primary Care Physician Wound #[address] Visits o Home Health Nurse may visit PRN to address patientos wound care needs. o FACE TO FACE ENCOUNTER: MEDICARE and MEDICAID PATIENTS: I certify that this patient is under my care and that I had a face-to-face encounter that meets the physician face-to-face encounter requirements with this patient on this date. The encounter with the patient was in whole or in part for the following MEDICAL CONDITION: (primary reason for [place]) MEDICAL NECESSITY: I certify, that based on my findings, NURSING services are a medically necessary home health service. HOME BOUND STATUS: I certify that my clinical findings support that this patient is homebound (i.e., Due to illness or injury, pt requires aid of supportive devices such as crutches, cane, wheelchairs, walkers, the use of special transportation or the assistance of another person to leave their place of residence. There is a normal inability to leave the home and doing so requires considerable and taxing effort. Other absences are for medical reasons / religious services and are infrequent or of short duration when for other reasons). o If current dressing causes regression in wound condition, may D/C ordered dressing product/s and apply Normal Saline Moist Dressing daily until next [place] / Other MD appointment. [place] of regression in wound condition at [phone]. o Please direct any NON-WOUND related issues/requests for orders to patient's Primary Care Physician Wound #7 Right Toe Second o [place] Nurse may visit PRN to address patientos wound care needs. o FACE  TO FACE ENCOUNTER: MEDICARE and MEDICAID PATIENTS: I certify that this patient is under my care and that I had a face-to-face encounter that meets the physician face-to-face encounter requirements with this patient on this date. The encounter with the patient was in whole or in part for the following MEDICAL CONDITION: (primary reason for [place]) MEDICAL NECESSITY: I certify, that based on my findings, NURSING services are a medically [name], [name] ([id number]) necessary home health service. HOME BOUND STATUS: I certify that my clinical findings support that this patient is homebound (i.e., Due to illness or injury, pt requires aid of supportive devices such as crutches, cane, wheelchairs, walkers, the use of special transportation or the assistance of another person to leave their place of residence. There is a normal inability to leave the home and doing so requires considerable and taxing effort. Other absences are for medical reasons / religious services and are infrequent or of short duration when for other reasons). o If current dressing causes regression in wound condition, may D/C ordered dressing product/s and apply Normal Saline Moist Dressing daily until next [place] / Other MD appointment. [place] of regression in wound condition at [phone]. o Please direct any NON-WOUND related issues/requests for orders to patient's Primary Care Physician Medications-please add to medication list. Wound #3 Nose o P.O. Antibiotics - Cipro 500mg  PO x 10days Wound #4 Medial Coccyx o P.O. Antibiotics - Cipro 500mg  PO x 10days Wound #7 Right Toe Second o P.O. Antibiotics - Cipro 500mg  PO x 10days Laboratory o Culture and Sensitivity - right second toe oooo Patient Medications Allergies: no known allergies Notifications Medication Indication Start End Cipro 04/20/2015 DOSE 1 - oral 500 mg tablet - one bid tablet oral Electronic  Signature(s) Signed: 04/20/2015 9:31:41 AM By: [name]  Previous Signature: 04/20/2015 9:26:35 AM Version By: Regan Lemming BSN, RN Entered By: [name] on 04/20/2015 09:31:40 [name] ([id number]) -------------------------------------------------------------------------------- Prescription 04/20/2015 Patient Name: [name] Physician: Vickie Fudge MD Date of Birth: [birth year] [name] NPI#: [id number] Sex: F DEA#: [id number] Phone #: [id number] License #: Patient Address: [place] [id number] [place], [place] [address] [facility] [address] [place], [place] [address] [phone] Allergies no known allergies 24 Orders P.O. Antibiotics - Cipro 500mg  PO x 10days Signature(s): Date(s): Electronic Signature(s) Signed: 04/20/2015 12:22:30 PM By: [name] Entered By: [name] on 04/20/2015 09:31:43 [name] ([id number]) --------------------------------------------------------------------------------  Problem List Details Patient Name: [name] Date of Service: 04/20/2015 8:45 AM Medical Record Number: [id number] Patient Account Number: [id number] Date of Birth/Sex: [birth year][name] (59 y.o. 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 01/10/2015 Yes layer exposed L97.522 Non-pressure chronic ulcer of other part of left foot with fat 01/10/2015 Yes layer exposed [id number] Chronic systolic (congestive) heart failure 01/10/2015 Yes E11.42 Type 2 diabetes mellitus with diabetic polyneuropathy  01/10/2015 Yes S00.33XA Contusion of nose, initial encounter 01/17/2015 Yes 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, left ankle and foot 02/13/2015 Yes 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, stage 3 03/03/2015 Yes L89.323 Pressure ulcer of left buttock, stage 3 03/03/2015 Yes L97.512 Non-pressure chronic ulcer of other part of right foot with 03/10/2015 Yes fat layer exposed [name], [name] ([id number]) L02.611 Cutaneous abscess of right foot 04/20/2015 Yes Inactive Problems Resolved Problems Electronic Signature(s) Signed: 04/20/2015 9:34:09 AM By: [name] Previous Signature: 04/20/2015 9:28:05 AM Version By: [name] Previous Signature: 04/20/2015 9:27:24 AM Version By: [name] Entered By: [name] on 04/20/2015 09:34:09 [name] ([id number]) -------------------------------------------------------------------------------- Progress Note Details Patient Name: [name] Date of Service: 04/20/2015 8:45 AM Medical Record Number: [id number] Patient Account Number: [id number] Date of Birth/Sex: [birth year] [name] (59 y.o. Female) Treating RN: [name] Primary Care Physician: [name] Other Clinician: Referring Physician: Reginia [name] Treating Physician/Extender: [name] in Treatment: 14 Subjective Chief Complaint Information obtained from Patient Patient presents to the wound care center for a consult due non healing wound 59 year old patient who comes with a history of an ulcer on the left second toe for at least about 4 months and a history on her left heel for about 3 weeks. I needed to get an MRI of her left foot but due to her defibrillator this could not be done and hence she is getting get a triple phase bone scan. 01/17/2015 -- she has developed a significant injury to the bridge of the nose where her CPAP machine was fitting. This is a painfull dark spot. History of Present Illness  (HPI) 59 year old patient who is known to have diabetes for several years and generally has it under good control last hemoglobin A1c was 6.7 done 2 weeks ago. She has several other comorbidities including acute CHF, hypoxia, hyperlipidemia, systolic heart failure, myocardial infarction, coronary artery disease, iron deficiency anemia, renal insufficiency, essential hypertension, morbid obesity and obstructive sleep apnea. 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 In the past  she's had a coronary angioplasty with stent in 2013, laparoscopic cholecystectomy in 2011, bilateral oophorectomy in April 2012 and she's had some lumbar disc surgery in 1996. She says she's had a lot of peripheral neuropathy and due to this she's had a ulcer on her left second toe for a while. She had a dry eschar on her left heel and recently this was removed and she found that she had some infection and some drainage from there. She has minimal pain around the heel but no pain on any areas of her toes. she has not been on any antibiotics recently and has known vascular problems in the recent past. 01/17/2015 left foot x-ray done on April 5 reveals there are destructive changes noted of the distal aspect of the proximal phalanx of the left second digit with extension into the adjacent proximal interphalangeal joint space. These findings suggest osteomyelitis with possible septic arthritis. I was able to make a call to [name] the radiologist, and had a detailed discussion with him regarding this x-ray. He noted that there were also some changes in the fifth phalanx on the left foot and this could have been chronic osteomyelitis too. The second toe on the left leg shows findings which cannot differentiate between an acute or chronic osteomyelitis and hence he has recommended a MRI with and without contrast. We will get this organized for her and in the meanwhile treat her  with oral antibiotics which is susceptible to Bactrim. She was seen yesterday by her PCP [name] who put her on 3 antibiotics which include Levaquin and doxycycline and Bactrim. 01/24/2015 She is scheduled for a triple PHASE bone scan on 01/26/2015. She is still on antibiotics and otherwise doing well. [name], [name] ([id number]) 01/31/2015 Bone Imaging 3 phase study -- IMPRESSION:1. There is increased uptake on all 3 phases localizing to the left hindfoot region. This may be a manifestation of osteomyelitis. Correlation with exact site of nonhealing ulcer. 2. There is increased uptake localizing to the second phalanx of the left foot on the blood pool and delayed phase images. 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 Underlying infection not excluded. I have personally discussed the x-ray studies with the radiologist [name] and after comparing all her films he agrees that the left second phalanx does not have any definite underlying infection and this is now off a chronic arthropathy. There is a increased uptake in all 3 phases localized to the left hindfoot region and this is a manifestation of osteomyelitis. 02/07/2015 -- She was seen by the ID specialist [name] who reviewed her on my request recently. He has recommended a PICC line and IV vancomycin and oral ciprofloxacin for 4 weeks. She will be reviewed by him periodically. He also received several notes from [name] to help as documented all the requisites for hyperbaric oxygen therapy. 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 He also had a coronary angioplasty with stent placement in October 2013. Prior to that her 2-D echo showed EF of 35%. Her latest 2-D echo done on 11/01/2014 showed a LV EF of 50-55%. Systolic function and wall motion were within normal limits. Chest  x-ray done in January 16, 2015 showed no acute findings. Last hemoglobin A1c done on 12/30/2014 was 6.7. CBC done on the same day was within normal limits, except for a hemoglobin being 8.7 and a hematocrit between  29%. She had arterial duplex study done in August 2015 which showed: No evidence of segmental lower extremity arterial disease at rest, bilaterally. Normal ABI's, bilaterally. Normal great toe pressures, bilaterally. With all the above in place I believe she is a very good candidate for hyperbaric oxygen therapy and I would recommend this with the usual protocol for 6 weeks. she is going to get her PICC line this Friday and the IV antibiotics and when to start. She is also starting on oral ciprofloxacin. 03/03/2015 - recently hospitalized for anemia. No source identified. Started on amiodarone, contraindication to HBO. Reports left heel feels better. Main complaint is new buttocks ulcers. No fever or chills. Minimal drainage. 03/10/2015 -- There is a new ulceration noticed yesterday on her right second toe at the tip. She also has noticed some ulcerations on her gluteal region and we will need to take a look at this. As far as her medications go she does not know for sure what her dosage of amiodarone is exactly. She will look it up and let me know today. 03/17/2015 -- as far as her dosage of amiodarone she was taking : 02/25/15 - 03/02/15 400 mg per day; 03/03/15 - 03/10/15 200 mg per day. She stopped taking the medication on 03/10/15. She has not yet done the checks x-ray and her PFTs are still pending. Recent Lab work done on 03/13/2015 shows a glucose is 124, K+ was 3.3, her albumen was 2.9, WBC count was 3.4, hemoglobin was 9.3, hematocrit was 30.4 and platelets were 240. C-reactive protein was 33.7 which was high and the Vancomycin trough was 18.6 which was high. 03/24/2015 -- he saw several doctors since I saw her last and they include her medical oncologist, cardiologist, neurologist  and infectious disease doctor. Her PICC line is out and she now is on oral Cipro and doxycycline as per her verbal report. She has completed her pulmonary function test yesterday and a [name], [name]. ([id number]) recent chest x-ray is within normal limits. If the PFTs within normal limits she will be able to restart the hyperbaric oxygen therapy. 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 Underlying interstitial lung disease to be considered. Further recommendations were that of a CT chest and pulmonary consult if indicated. Objective Constitutional Pulse regular. Respirations normal and unlabored. Afebrile. Vitals Time Taken: 9:04 AM, Height: 68 in, Weight: 257 lbs, BMI: 39.1, Temperature: 97.8 F, Pulse: 86 bpm, Respiratory Rate: 18 breaths/min, Blood Pressure: 123/67 mmHg. Eyes Nonicteric. Reactive to light. Ears, Nose, Mouth, and Throat Lips, teeth, and gums WNL.Marland Kitchen Moist mucosa without lesions . Neck supple and nontender. No palpable supraclavicular or cervical adenopathy. Normal sized without goiter. Respiratory WNL. No retractions.. Cardiovascular Pedal Pulses WNL. No clubbing, cyanosis or edema. Gastrointestinal (GI) Abdomen without masses or tenderness.. No liver or spleen enlargement or tenderness.. Musculoskeletal Adexa without tenderness or enlargement.. Digits and nails w/o clubbing, cyanosis, infection, petechiae, ischemia, or inflammatory conditions.Marland Kitchen Psychiatric Judgement and insight Intact.. No evidence of depression, anxiety, or agitation.. General Notes: the left calcaneum is completely healed. The right gluteal area is looking clean and the base is healthy. The right second toe has a Cysts on the medial part of the distal phalanx and this needs an IandD. [name], [name] ([id number]) Integumentary (Hair, Skin) No suspicious lesions. No  crepitus or fluctuance. No peri-wound warmth or erythema. No masses.. Wound #3 status is Open. Original cause  of wound was Pressure Injury. The wound is located on the Nose. The wound measures 0.8cm length x 0.7cm width x 0.1cm depth; 0.44cm^2 area and 0.044cm^3 volume. The wound is limited to skin breakdown. There is a none present amount of drainage noted. The wound margin is distinct with the outline attached to the wound base. There is no granulation within the wound bed. There is a large (67-100%) amount of necrotic tissue within the wound bed including Eschar. The periwound skin appearance exhibited: Dry/Scaly. The periwound skin appearance did not exhibit: Callus, Crepitus, Excoriation, Fluctuance, Friable, Induration, Localized Edema, Rash, Scarring, Maceration, Moist, Atrophie Blanche, Cyanosis, Ecchymosis, Hemosiderin Staining, Mottled, Pallor, Rubor, Erythema. Periwound temperature was noted as No Abnormality. Wound #4 status is Open. Original cause of wound was Pressure Injury. The wound is located on the Medial Coccyx. The wound measures 1cm length x 0.6cm width x 0.1cm depth; 0.471cm^2 area and 0.047cm^3 volume. The wound is limited to skin breakdown. There is no tunneling or undermining noted. There is a small amount of serous drainage noted. The wound margin is distinct with the outline attached to the wound base. There is large (67-100%) pink granulation within the wound bed. There is no necrotic tissue within the wound bed. The periwound skin appearance exhibited: Moist. The periwound skin appearance did not exhibit: Callus, Crepitus, Excoriation, Fluctuance, Friable, Induration, Localized Edema, Rash, Scarring, Dry/Scaly, Maceration, Atrophie Blanche, Cyanosis, Ecchymosis, Hemosiderin Staining, Mottled, Pallor, Rubor, Erythema. Wound #7 status is Open. Original cause of wound was Gradually Appeared. The wound is located on the Right Toe Second. The wound measures 0.4cm length x  0.5cm width x 0.5cm depth; 0.157cm^2 area and 0.079cm^3 volume. The wound is limited to skin breakdown. There is no tunneling or undermining noted. There is a medium amount of serosanguineous drainage noted. The wound margin is distinct with the outline attached to the wound base. There is medium (34-66%) red granulation within the wound bed. There is a small (1-33%) amount of necrotic tissue within the wound bed including Adherent Slough. The periwound skin appearance exhibited: Moist. The periwound skin appearance did not exhibit: Callus, Crepitus, Excoriation, Fluctuance, Friable, Induration, Localized Edema, Rash, Scarring, Dry/Scaly, Maceration, Atrophie Blanche, Cyanosis, Ecchymosis, Hemosiderin Staining, Mottled, Pallor, Rubor, Erythema. Periwound temperature was noted as No Abnormality. The periwound has tenderness on palpation. 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 ([id number]) L89.313 - Pressure ulcer of right buttock, stage 3 L89.323 - Pressure ulcer of left buttock, stage 3 L97.512 - Non-pressure chronic ulcer of other part of right foot with fat layer exposed L02.611 - Cutaneous abscess of right foot I will start using silver alginate packed into the wound on the right second toe. She would also need to offload this foot. I'm empirically putting her on Cipro 500 mg twice a day for 10 days. We will await the culture report. She is to continue with hyperbaric oxygen therapy as planned and this will definitely help this  Wagner grade 3 ulceration on her right foot. Wound care instructions and offloading has been discussed with her in detail and she will come back and  see me next week. Procedures Wound #7 Wound #7 is a Diabetic Wound/Ulcer of the Lower Extremity located on the Right Toe Second . Abscess incision and drainage was provided by Pat Patrick., MD. The skin was cleansed and prepped with anti- septic followed by pain control using Lidocaine 4% Topical Solution. An incision was made in the right second toe with the following instrument(s): Forceps and Scissors. There was an immediate release of Purulent fluid. A Moderate amount of bleeding was controlled with Pressure. A time out was conducted prior to the start of the procedure. Swab culture was sent. The procedure was tolerated well with a pain level of 0 throughout and a pain level of 0 following the procedure. General Notes: The ulcerated area on the tip of the right second toe now had a subcutaneous tunnel going to the medial part of the distal phalanx of the second toe. This was incised and drained.. Plan Wound Cleansing: Wound #3 Nose: Clean wound with Normal Saline. May Shower, gently pat wound dry prior to applying new dressing. May shower with protection. Wound #4 Medial Coccyx: Clean wound with Normal Saline. May Shower, gently pat wound dry prior to applying new dressing. May shower with protection. Wound #7 Right Toe Second: [name], [name] ([id number]) Clean wound with Normal Saline. May Shower, gently pat wound dry prior to applying new dressing. May shower with protection. Anesthetic: Wound #3 Nose: Topical Lidocaine 4% cream applied to wound bed prior to debridement Wound #4 Medial Coccyx: Topical Lidocaine 4% cream applied to wound bed prior to debridement Wound #7 Right Toe Second: Topical Lidocaine 4% cream applied to wound bed prior to debridement Primary Wound Dressing: Wound #3 Nose: Other: - betadine  paint Wound #4 Medial Coccyx: Prisma Ag Wound #7 Right Toe Second: Aquacel Ag Secondary Dressing: Wound #4 Medial Coccyx: Boardered Foam Dressing Wound #7 Right Toe Second: Gauze and Kerlix/Conform Dressing Change Frequency: Wound #3 Nose: Change dressing every other day. Wound #4 Medial Coccyx: Change dressing every other day. Wound #7 Right Toe Second: Change dressing every other day. Follow-up Appointments: Wound #3 Nose: Return Appointment in 1 week. Wound #4 Medial Coccyx: Return Appointment in 1 week. Wound #7 Right Toe Second: Return Appointment in 1 week. Off-Loading: Wound #4 Medial Coccyx: Turn and reposition every 2 hours Wound #7 Right Toe Second: Heel suspension boot to: - Sage boots Additional Orders / Instructions: Wound #3 Nose: Increase protein intake. Activity as tolerated Wound #4 Medial Coccyx: Increase protein intake. Activity as tolerated Wound #7 Right Toe Second: Increase protein intake. Activity as tolerated [name], [name] ([id number]) Home Health: Wound #3 Nose: Continue Home Health Visits Home Health Nurse may visit PRN to address patient s wound care needs. FACE TO FACE ENCOUNTER: MEDICARE and MEDICAID PATIENTS: I certify that this patient is under my care and that I had a face-to-face encounter that meets the physician face-to-face encounter requirements with this patient on this date. The encounter with the patient was in whole or in part for the following MEDICAL CONDITION: (primary reason for [place]) MEDICAL NECESSITY: I certify, that based on my findings, NURSING services are a medically necessary home health service. HOME BOUND STATUS: I certify that my clinical findings support that this patient is homebound (i.e., Due to illness or injury, pt requires aid of supportive devices such as crutches, cane, wheelchairs, walkers, the use of special transportation or the assistance of another person to leave their place of  residence. There is a normal inability to  leave the home and doing so requires considerable and taxing effort. Other absences are for medical reasons / religious services and are infrequent or of short duration when for other reasons). If current dressing causes regression in wound condition, may D/C ordered dressing product/s and apply Normal Saline Moist Dressing daily until next [place] / Other MD appointment. [place] of regression in wound condition at [phone]. Please direct any NON-WOUND related issues/requests for orders to patient's Primary Care Physician Wound #4 Medial Coccyx: [place] Nurse may visit PRN to address patient s wound care needs. FACE TO FACE ENCOUNTER: MEDICARE and MEDICAID PATIENTS: I certify that this patient is under my care and that I had a face-to-face encounter that meets the physician face-to-face encounter requirements with this patient on this date. The encounter with the patient was in whole or in part for the following MEDICAL CONDITION: (primary reason for [place]) MEDICAL NECESSITY: I certify, that based on my findings, NURSING services are a medically necessary home health service. HOME BOUND STATUS: I certify that my clinical findings support that this patient is homebound (i.e., Due to illness or injury, pt requires aid of supportive devices such as crutches, cane, wheelchairs, walkers, the use of special transportation or the assistance of another person to leave their place of residence. There is a normal inability to leave the home and doing so requires considerable and taxing effort. Other absences are for medical reasons / religious services and are infrequent or of short duration when for other reasons). If current dressing causes regression in wound condition, may D/C ordered dressing product/s and apply Normal Saline Moist Dressing daily until next [place] /  Other MD appointment. [place] of regression in wound condition at [phone]. Please direct any NON-WOUND related issues/requests for orders to patient's Primary Care Physician Wound #7 Right Toe Second: [place] Nurse may visit PRN to address patient s wound care needs. FACE TO FACE ENCOUNTER: MEDICARE and MEDICAID PATIENTS: I certify that this patient is under my care and that I had a face-to-face encounter that meets the physician face-to-face encounter requirements with this patient on this date. The encounter with the patient was in whole or in part for the following MEDICAL CONDITION: (primary reason for [place]) MEDICAL NECESSITY: I certify, that based on my findings, NURSING services are a medically necessary home health service. HOME BOUND STATUS: I certify that my clinical findings support that this patient is homebound (i.e., Due to illness or injury, pt requires aid of supportive devices such as crutches, cane, wheelchairs, walkers, the use of special transportation or the assistance of another person to leave their place of residence. There is a normal inability to leave the home and doing so requires considerable and taxing effort. Other absences are for medical reasons / religious services and are infrequent or of short duration when for other reasons). If current dressing causes regression in wound condition, may D/C ordered dressing product/s and apply Normal Saline Moist Dressing daily until next [place] / Other MD appointment. [place] of regression in wound condition at [phone]. ZAINA, KEMMERLING ([id number]) Please direct any NON-WOUND related issues/requests for orders to patient's Primary Care Physician Medications-please add to medication list.: Wound #3 Nose: P.O. Antibiotics - Cipro 500mg  PO x 10days Wound #4 Medial Coccyx: P.O. Antibiotics - Cipro 500mg  PO x  10days Wound #7 Right Toe Second: P.O. Antibiotics -  Cipro 500mg  PO x 10days Laboratory ordered were: Culture and Sensitivity - right second toe The following medication(s) was prescribed: Cipro oral 500 mg tablet 1 one bid tablet oral starting 04/20/2015 I will start using silver alginate packed into the wound on the right second toe. She would also need to offload this foot. I'm empirically putting her on Cipro 500 mg twice a day for 10 days. We will await the culture report. She is to continue with hyperbaric oxygen therapy as planned and this will definitely help this Wagner grade 3 ulceration on her right foot. Wound care instructions and offloading has been discussed with her in detail and she will come back and see me next week. Electronic Signature(s) Signed: 04/20/2015 9:38:48 AM By: [name] Entered By: [name] on 04/20/2015 09:38:47 [name] ([id number]) -------------------------------------------------------------------------------- SuperBill Details Patient Name: [name] Date of Service: 04/20/2015 Medical Record Number: [id number] Patient Account Number: [id number] Date of Birth/Sex: [name] (59 y.o. Female) Treating RN: Afful, RN, BSN, [place] Primary Care Physician: [name] Other Clinician: Referring Physician: Reginia [name] Treating Physician/Extender: [name] in Treatment: 14 Diagnosis Coding ICD-10 Codes Code Description E11.621 Type 2 diabetes mellitus with foot ulcer L97.422 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 layer exposed L97.522 Non-pressure chronic ulcer of other part of left foot with fat layer exposed [id number] Chronic systolic (congestive) heart failure E11.42 Type 2 diabetes mellitus with diabetic polyneuropathy S00.33XA Contusion of nose, initial encounter 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, 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, stage  3 L89.323 Pressure ulcer of left buttock, stage 3 L97.512 Non-pressure chronic ulcer of other part of right foot with fat layer exposed L02.611 Cutaneous abscess of right foot Facility Procedures CPT4 Code: [id number] Description: 10060 - I and D Abscess; simple/single ICD-10 Description Diagnosis L02.611 Cutaneous abscess of right foot Modifier: Quantity: 1 Physician Procedures CPT4 Code: [id number] Description: 99213 - WC PHYS LEVEL 3 - EST PT ICD-10 Description Diagnosis L02.611 Cutaneous abscess of right foot Modifier: 25 Quantity: 1 CPT4 Code: [id number] Dannielle Karvonen, Micheline Description: 10060 - I and D Abscess; simple/single ICD-10 Description Diagnosis L02.611 Cutaneous abscess of right foot S. ([id number]) Modifier: Quantity: 1 Electronic Signature(s) Signed: 04/20/2015 9:42:34 AM By: [name] Previous Signature: 04/20/2015 9:39:25 AM Version By: [name] Entered By: [name] on 04/20/2015 09:42:34

## 2015-04-21 ENCOUNTER — Encounter: Payer: PPO | Admitting: Surgery

## 2015-04-21 DIAGNOSIS — L97422 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 layer exposed: Secondary | ICD-10-CM | POA: Diagnosis not present

## 2015-04-21 LAB — GLUCOSE, CAPILLARY
Glucose-Capillary: 172 mg/dL — ABNORMAL HIGH (ref 65–99)
Glucose-Capillary: 148 mg/dL — ABNORMAL HIGH (ref 65–99)

## 2015-04-21 NOTE — Progress Notes (Signed)
NICHOEL, COAN ([id number]) Visit Report for 04/21/2015 HBO Details Patient Name: [name], [name] Date of Service: 04/21/2015 10:00 AM Medical Record Number: [id number] Patient Account Number: [id number] Date of Birth/Sex: [birth year] (59 y.o. 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 HBO Treatment Details Treatment Number: 8 Patient Type: Outpatient Chamber Type: Monoplace Chamber #: [id number] Treatment Protocol: 2.0 ATA with 90 minutes oxygen, and no air breaks Treatment Details Compression Rate Down: 1.5 psi / minute De-Compression Rate Up: 1.5 psi / minute Air breaks and breathing Compress Tx Pressure Decompress Decompress periods Begins Reached Begins Ends (leave unused spaces blank) Chamber Pressure 1 ATA 2.0 ATA - - - - - - 2.0 ATA 1 ATA Clock Time (24 hr) 09:32 09:44 - - - - - - 11:14 11:25 Treatment Length: 113 (minutes) Treatment Segments: 4 Capillary Blood Glucose Pre Capillary Blood Glucose (mg/dl): Post Capillary Blood Glucose (mg/dl): Vital Signs Capillary Blood Glucose Reference Range: 80 - 120 mg / dl HBO Diabetic Blood Glucose Intervention Range: <131 mg/dl or >249 mg/dl Time Vitals Blood Respiratory Capillary Blood Glucose Pulse Action Type: Pulse: Temperature: Taken: Pressure: Rate: Glucose (mg/dl): Meter #: Oximetry (%) Taken: Pre 09:18 98/56 84 18 97.9 172 1 none Post 11:30 122/64 84 18 97.9 148 1 none Treatment Response Well [name], [name] ([id number]) Treatment Toleration: Treatment Treatment Completed without Adverse Event Completion Status: Electronic  Signature(s) Signed: 04/21/2015 11:42:34 AM By: Paulla Fore, RRT, CHT Signed: 04/21/2015 12:17:31 PM By: Christin Fudge MD, FACS Entered By: Lorine Bears on 04/21/2015 11:41:23 [name] ([id number]) -------------------------------------------------------------------------------- HBO Safety Checklist Details Patient Name: [name] Date of Service: 04/21/2015 10:00 AM Medical Record Number: [id number] Patient Account Number: [id number] Date of Birth/Sex: [birth year] (59 y.o. Female) Treating RN: Primary Care Physician: [name] Other Clinician: Jacqulyn Bath Referring Physician: [name] Treating Physician/Extender: Frann Rider in Treatment: 14 HBO Safety Checklist Items Safety Checklist Consent Form Signed Patient voided / foley secured and emptied When did you last eato 21:00 pm 04/20/15 Last dose of injectable or oral agent 08:00 am Ostomy pouch emptied and vented if applicable n/a All implantable devices assessed, documented and approved ICD Intravenous access site secured and place n/a Valuables secured Linens and cotton and cotton/polyester blend (less than 51% polyester) Personal oil-based products / skin lotions / body lotions removed Wigs or hairpieces removed Smoking or tobacco materials removed Books / newspapers / magazines / loose paper removed Cologne, aftershave, perfume and deodorant removed Jewelry removed (may wrap wedding band) Make-up removed Hair care products removed Battery operated devices (external) removed Heating patches and chemical warmers removed Titanium eyewear removed n/a Nail polish cured greater than 10 hours n/a Casting material cured greater than 10 hours n/a Hearing aids removed n/a Loose dentures or partials removed n/a Prosthetics have been removed n/a Patient demonstrates correct use of air break device (if applicable) Patient concerns have been addressed Patient  grounding bracelet on and cord attached to chamber Specifics for Inpatients (complete in addition to above) Medication sheet sent with patient Intravenous medications needed or due during therapy sent with patient [name], [name] ([id number]) Drainage tubes (e.g. nasogastric tube or chest tube secured and vented) Endotracheal or Tracheotomy tube secured Cuff deflated  of air and inflated with saline Airway suctioned Electronic Signature(s) Signed: 04/21/2015 11:42:34 AM By: Lorine Bears RCP, RRT, CHT Entered By: Becky Sax, Amado Nash on 04/21/2015 09:27:49

## 2015-04-21 NOTE — Progress Notes (Signed)
BRIT, BOYAN ([id number]) Visit Report for 04/21/2015 Arrival Information Details Patient Name: [name], [name] Date of Service: 04/21/2015 10:00 AM Medical Record Number: [id number] Patient Account Number: [id number] Date of Birth/Sex: 1956-06-22 (59 y.o. Female) Treating RN: Primary Care Physician: [name] Other Clinician: Jacqulyn Bath Referring Physician: [name] Treating Physician/Extender: Frann Rider in Treatment: 14 Visit Information History Since Last Visit Added or deleted any medications: No Patient Arrived: Wheel Chair Any new allergies or adverse reactions: No Arrival Time: 09:15 Had a fall or experienced change in No Accompanied By: husband activities of daily living that may affect Transfer Assistance: Manual risk of falls: Patient Identification Verified: Yes Signs or symptoms of abuse/neglect No Secondary Verification Process Yes since last visito Completed: Hospitalized since last visit: No Patient Requires Transmission- No Has Dressing in Place as Prescribed: Yes Based Precautions: Has Footwear/Offloading in Place as Yes Patient Has Alerts: Yes Prescribed: Patient Alerts: Patient on Blood Left: Surgical Shoe with Thinner Pressure Relief ABI L:0.8, Insole R:0.93 Right: Surgical Shoe with Pressure Relief Insole Pain Present Now: No Electronic Signature(s) Signed: 04/21/2015 11:42:34 AM By: Lorine Bears RCP, RRT, CHT Entered By: Lorine Bears on 04/21/2015 09:25:57 [name] ([id number]) -------------------------------------------------------------------------------- Encounter Discharge Information Details Patient Name: [name] Date of Service: 04/21/2015 10:00 AM Medical Record Number: [id number] Patient Account Number: [id number] Date of Birth/Sex: 1956/01/29 (59 y.o. Female) Treating RN: Primary Care Physician: [name] Other Clinician: Jacqulyn Bath Referring  Physician: [name] Treating Physician/Extender: Frann Rider in Treatment: 32 Encounter Discharge Information Items Discharge Pain Level: 0 Discharge Condition: Stable Ambulatory Status: Wheelchair Discharge Destination: Home Private Transportation: Auto Accompanied By: husband Schedule Follow-up Appointment: No Medication Reconciliation completed and No provided to Patient/Care Werner Labella: Clinical Summary of Care: Notes Patient has an HBO treatment scheduled on 04/24/15 at 10:00 am. Electronic Signature(s) Signed: 04/21/2015 11:42:34 AM By: Lorine Bears RCP, RRT, CHT Entered By: Lorine Bears on 04/21/2015 11:42:14 [name] ([id number]) -------------------------------------------------------------------------------- Vitals Details Patient Name: [name] Date of Service: 04/21/2015 10:00 AM Medical Record Number: [id number] Patient Account Number: [id number] Date of Birth/Sex: Mar 08, 1956 (59 y.o. Female) Treating RN: Primary Care Physician: [name] Other Clinician: Jacqulyn Bath Referring Physician: [name] Treating Physician/Extender: Frann Rider in Treatment: 14 Vital Signs Time Taken: 09:18 Temperature (F): 97.9 Height (in): 68 Pulse (bpm): 84 Weight (lbs): 257 Respiratory Rate (breaths/min): 18 Body Mass Index (BMI): 39.1 Blood Pressure (mmHg): 98/56 Capillary Blood Glucose (mg/dl): 172 Reference Range: 80 - 120 mg / dl Electronic Signature(s) Signed: 04/21/2015 11:42:34 AM By: Lorine Bears RCP, RRT, CHT Entered By: Becky Sax, Amado Nash on 04/21/2015 [id number]

## 2015-04-24 ENCOUNTER — Telehealth: Payer: Self-pay | Admitting: Neurology

## 2015-04-24 ENCOUNTER — Encounter: Payer: PPO | Admitting: Surgery

## 2015-04-24 DIAGNOSIS — L97422 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 layer exposed: Secondary | ICD-10-CM | POA: Diagnosis not present

## 2015-04-24 LAB — WOUND CULTURE: Gram Stain: NONE SEEN

## 2015-04-24 LAB — GLUCOSE, CAPILLARY
Glucose-Capillary: 288 mg/dL — ABNORMAL HIGH (ref 65–99)
Glucose-Capillary: 276 mg/dL — ABNORMAL HIGH (ref 65–99)

## 2015-04-24 NOTE — Telephone Encounter (Signed)
Can you see when her LP is scheduled?  Orders are in.    Thanks.

## 2015-04-24 NOTE — Progress Notes (Addendum)
[name], [name] ([id number]) Visit Report for 04/24/2015 HBO Details Patient Name: [name], [name] Date of Service: 04/24/2015 10:00 AM Medical Record Number: [id number] Patient Account Number: [id number] Date of Birth/Sex: July 17, 1956 (59 y.o. 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 HBO Treatment Details Treatment Number: 9 Patient Type: Outpatient Chamber Type: Monoplace Chamber #: [id number] Treatment Protocol: 2.0 ATA with 90 minutes oxygen, and no air breaks Treatment Details Compression Rate Down: 1.5 psi / minute De-Compression Rate Up: 1.5 psi / minute Air breaks and breathing Compress Tx Pressure Decompress Decompress periods Begins Reached Begins Ends (leave unused spaces blank) Chamber Pressure 1 ATA 2.0 ATA - - - - - - 2.0 ATA 1 ATA Clock Time (24 hr) 08:33 08:43 - - - - - - 10:13 10:24 Treatment Length: 111 (minutes) Treatment Segments: 4 Capillary Blood Glucose Pre Capillary Blood Glucose (mg/dl): Post Capillary Blood Glucose (mg/dl): Vital Signs Capillary Blood Glucose Reference Range: 80 - 120 mg / dl HBO Diabetic Blood Glucose Intervention Range: <131 mg/dl or >249 mg/dl Time Vitals Blood Respiratory Capillary Blood Glucose Pulse Action Type: Pulse: Temperature: Taken: Pressure: Rate: Glucose (mg/dl): Meter #: Oximetry (%) Taken: Pre 09:19 92/56 78 18 97.8 288 1 none Post 11:28 116/64 84 18 97.6 276 1 none Treatment Response Well [name], [name] ([id number]) Treatment Toleration: Treatment Treatment Completed without Adverse Event Completion Status: HBO  Attestation I certify that I supervised this HBO treatment in accordance with Medicare guidelines. A trained Yes emergency response team is readily available per hospital policies and procedures. Continue HBOT as ordered. Yes Electronic Signature(s) Signed: 04/27/2015 9:58:09 AM By: Paulla Fore, RRT, CHT Signed: 04/27/2015 11:59:42 AM By: Christin Fudge MD, FACS Previous Signature: 04/24/2015 12:12:47 PM Version By: Christin Fudge MD, FACS Previous Signature: 04/24/2015 12:03:51 PM Version By: Lorine Bears RCP, RRT, CHT Entered By: Lorine Bears on 04/27/2015 09:57:37 [name] ([id number]) -------------------------------------------------------------------------------- HBO Safety Checklist Details Patient Name: [name] Date of Service: 04/24/2015 10:00 AM Medical Record Number: [id number] Patient Account Number: [id number] Date of Birth/Sex: 09-26-1956 (59 y.o. Female) Treating RN: Primary Care Physician: [name] Other Clinician: Jacqulyn Bath Referring Physician: [name] Treating Physician/Extender: Frann Rider in Treatment: 14 HBO Safety Checklist Items Safety Checklist Consent Form Signed Patient voided / foley secured and emptied When did you last eato 21:00 pm on 04/23/15 Last dose of injectable or oral agent 08:15 am Ostomy pouch emptied and vented if applicable n/a All implantable devices assessed, documented and approved ICD Intravenous access site secured and place n/a Valuables secured Linens and cotton and cotton/polyester blend (less than 51% polyester) Personal oil-based products / skin lotions / body lotions removed Wigs or hairpieces removed Smoking or tobacco materials removed Books / newspapers / magazines / loose paper removed Cologne, aftershave, perfume and deodorant removed Jewelry removed (may wrap wedding band) Make-up removed Hair care products removed Battery  operated devices (external) removed Heating patches and chemical warmers removed Titanium eyewear removed n/a Nail polish cured greater than 10 hours n/a Casting material cured greater than 10 hours n/a Hearing aids removed n/a Loose dentures or partials removed n/a Prosthetics have been removed n/a Patient demonstrates correct use of air break device (if applicable)  Patient concerns have been addressed Patient grounding bracelet on and cord attached to chamber Specifics for Inpatients (complete in addition to above) Medication sheet sent with patient Intravenous medications needed or due during therapy sent with patient [name], [name] ([id number]) Drainage tubes (e.g. nasogastric tube or chest tube secured and vented) Endotracheal or Tracheotomy tube secured Cuff deflated of air and inflated with saline Airway suctioned Electronic Signature(s) Signed: 04/24/2015 12:03:51 PM By: Lorine Bears RCP, RRT, CHT Entered By: Lorine Bears on 04/24/2015 09:36:07

## 2015-04-24 NOTE — Telephone Encounter (Signed)
Waiting for [name] to call me back.

## 2015-04-24 NOTE — Progress Notes (Signed)
LUCILIA, MICHAL ([id number]) Visit Report for 04/24/2015 Arrival Information Details Patient Name: ALEXANDR, BAUTZ Date of Service: 04/24/2015 10:00 AM Medical Record Number: [id number] Patient Account Number: [id number] Date of Birth/Sex: [birth year] (59 y.o. Female) Treating RN: Primary Care Physician: [name] Other Clinician: Jacqulyn Bath Referring Physician: [name] Treating Physician/Extender: Frann Rider in Treatment: 14 Visit Information History Since Last Visit Added or deleted any medications: No Patient Arrived: Wheel Chair Any new allergies or adverse reactions: No Arrival Time: 09:15 Had a fall or experienced change in No Accompanied By: husband activities of daily living that may affect Transfer Assistance: Manual risk of falls: Patient Identification Verified: Yes Signs or symptoms of abuse/neglect No Secondary Verification Process Yes since last visito Completed: Hospitalized since last visit: No Patient Requires Transmission- No Has Dressing in Place as Prescribed: Yes Based Precautions: Has Footwear/Offloading in Place as Yes Patient Has Alerts: Yes Prescribed: Patient Alerts: Patient on Blood Left: Surgical Shoe with Thinner Pressure Relief ABI L:0.8, Insole R:0.93 Right: Surgical Shoe with Pressure Relief Insole Pain Present Now: No Electronic Signature(s) Signed: 04/24/2015 12:03:51 PM By: Lorine Bears RCP, RRT, CHT Entered By: Lorine Bears on 04/24/2015 09:33:52 [name] ([id number]) -------------------------------------------------------------------------------- Encounter Discharge Information Details Patient Name: [name] Date of Service: 04/24/2015 10:00 AM Medical Record Number: [id number] Patient Account Number: [id number] Date of Birth/Sex: 1955-12-20 (59 y.o. Female) Treating RN: Primary Care Physician: [name] Other Clinician: Jacqulyn Bath Referring  Physician: [name] Treating Physician/Extender: Frann Rider in Treatment: 93 Encounter Discharge Information Items Discharge Pain Level: 0 Discharge Condition: Stable Ambulatory Status: Wheelchair Discharge Destination: Home Private Transportation: Auto Accompanied By: husband Schedule Follow-up Appointment: No Medication Reconciliation completed and No provided to Patient/Care Tiaunna Buford: Clinical Summary of Care: Notes Patient has an HBO treatment scheduled on 04/25/15 at 10:00 am. Electronic Signature(s) Signed: 04/24/2015 12:03:51 PM By: Lorine Bears RCP, RRT, CHT Entered By: Lorine Bears on 04/24/2015 12:03:26 [name] ([id number]) -------------------------------------------------------------------------------- Vitals Details Patient Name: [name] Date of Service: 04/24/2015 10:00 AM Medical Record Number: [id number] Patient Account Number: [id number] Date of Birth/Sex: [birth year] (59 y.o. Female) Treating RN: Primary Care Physician: [name] Other Clinician: Jacqulyn Bath Referring Physician: [name] Treating Physician/Extender: Frann Rider in Treatment: 14 Vital Signs Time Taken: 09:19 Temperature (F): 97.8 Height (in): 68 Pulse (bpm): 78 Weight (lbs): 257 Respiratory Rate (breaths/min): 18 Body Mass Index (BMI): 39.1 Blood Pressure (mmHg): 92/56 Capillary Blood Glucose (mg/dl): 288 Reference Range: 80 - 120 mg / dl Electronic Signature(s) Signed: 04/24/2015 12:03:51 PM By: Lorine Bears RCP, RRT, CHT Entered By: Lorine Bears on 04/24/2015 09:34:47

## 2015-04-24 NOTE — Telephone Encounter (Signed)
[name] was having trouble trying to reach patient but finally spoke to her today.  She is scheduled for Friday, July 22.

## 2015-04-25 ENCOUNTER — Ambulatory Visit (INDEPENDENT_AMBULATORY_CARE_PROVIDER_SITE_OTHER): Payer: PPO | Admitting: *Deleted

## 2015-04-25 ENCOUNTER — Encounter: Payer: PPO | Admitting: Surgery

## 2015-04-25 ENCOUNTER — Telehealth: Payer: Self-pay

## 2015-04-25 ENCOUNTER — Encounter: Payer: Self-pay | Admitting: Internal Medicine

## 2015-04-25 DIAGNOSIS — I5022 Chronic systolic (congestive) heart failure: Secondary | ICD-10-CM

## 2015-04-25 DIAGNOSIS — L97422 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 layer exposed: Secondary | ICD-10-CM | POA: Diagnosis not present

## 2015-04-25 DIAGNOSIS — I255 Ischemic cardiomyopathy: Secondary | ICD-10-CM | POA: Diagnosis not present

## 2015-04-25 DIAGNOSIS — E119 Type 2 diabetes mellitus without complications: Secondary | ICD-10-CM

## 2015-04-25 DIAGNOSIS — E1142 Type 2 diabetes mellitus with diabetic polyneuropathy: Secondary | ICD-10-CM

## 2015-04-25 LAB — GLUCOSE, CAPILLARY
Glucose-Capillary: 332 mg/dL — ABNORMAL HIGH (ref 65–99)
Glucose-Capillary: 284 mg/dL — ABNORMAL HIGH (ref 65–99)

## 2015-04-25 MED ORDER — GLIMEPIRIDE 4 MG PO TABS: 4.0000 mg | ORAL_TABLET | Freq: Two times a day (BID) | ORAL | Status: DC

## 2015-04-25 MED ORDER — NORTRIPTYLINE HCL 50 MG PO CAPS: 50.0000 mg | ORAL_CAPSULE | Freq: Every day | ORAL | Status: DC

## 2015-04-25 NOTE — Progress Notes (Signed)
[name], [name] ([id number]) Visit Report for 04/25/2015 Arrival Information Details Patient Name: [name], [name] Date of Service: 04/25/2015 10:00 AM Medical Record Number: [id number] Patient Account Number: [id number] Date of Birth/Sex: 29-May-1956 (59 y.o. Female) Treating RN: Primary Care Physician: [name] Other Clinician: Jacqulyn Bath Referring Physician: [name] Treating Physician/Extender: Frann Rider in Treatment: 15 Visit Information History Since Last Visit Added or deleted any medications: No Patient Arrived: Wheel Chair Any new allergies or adverse reactions: No Arrival Time: 09:30 Had a fall or experienced change in No Accompanied By: husband activities of daily living that may affect Transfer Assistance: Manual risk of falls: Patient Identification Verified: Yes Signs or symptoms of abuse/neglect No Secondary Verification Process Yes since last visito Completed: Hospitalized since last visit: No Patient Requires Transmission- No Has Dressing in Place as Prescribed: Yes Based Precautions: Has Footwear/Offloading in Place as Yes Patient Has Alerts: Yes Prescribed: Patient Alerts: Patient on Blood Left: Surgical Shoe with Thinner Pressure Relief ABI L:0.8, Insole R:0.93 Right: Surgical Shoe with Pressure Relief Insole Pain Present Now: No Electronic Signature(s) Signed: 04/25/2015 3:39:37 PM By: Lorine Bears RCP, RRT, CHT Entered By: Lorine Bears on 04/25/2015 09:49:56 [name] ([id number]) -------------------------------------------------------------------------------- Encounter Discharge Information Details Patient Name: [name] Date of Service: 04/25/2015 10:00 AM Medical Record Number: [id number] Patient Account Number: [id number] Date of Birth/Sex: 1956-06-12 (59 y.o. Female) Treating RN: Primary Care Physician: [name] Other Clinician: Jacqulyn Bath Referring  Physician: [name] Treating Physician/Extender: Frann Rider in Treatment: 15 Encounter Discharge Information Items Discharge Pain Level: 0 Discharge Condition: Stable Ambulatory Status: Wheelchair Discharge Destination: Home Private Transportation: Auto Accompanied By: husband Schedule Follow-up Appointment: No Medication Reconciliation completed and No provided to Patient/Care Cleon Signorelli: Clinical Summary of Care: Notes Patient has an HBO treatment scheduled on 04/26/15 at 10:00 am. Electronic Signature(s) Signed: 04/25/2015 3:39:37 PM By: Lorine Bears RCP, RRT, CHT Entered By: Lorine Bears on 04/25/2015 12:02:31 [name] ([id number]) -------------------------------------------------------------------------------- Vitals Details Patient Name: [name] Date of Service: 04/25/2015 10:00 AM Medical Record Number: [id number] Patient Account Number: [id number] Date of Birth/Sex: 1955/11/06 (59 y.o. Female) Treating RN: Primary Care Physician: [name] Other Clinician: Jacqulyn Bath Referring Physician: [name] Treating Physician/Extender: Frann Rider in Treatment: 15 Vital Signs Time Taken: 09:32 Temperature (F): 98.0 Height (in): 68 Pulse (bpm): 84 Weight (lbs): 257 Respiratory Rate (breaths/min): 18 Body Mass Index (BMI): 39.1 Blood Pressure (mmHg): 88/56 Capillary Blood Glucose (mg/dl): 332 Reference Range: 80 - 120 mg / dl Electronic Signature(s) Signed: 04/25/2015 3:39:37 PM By: Lorine Bears RCP, RRT, CHT Entered By: Lorine Bears on 04/25/2015 09:50:26

## 2015-04-25 NOTE — Telephone Encounter (Signed)
Pharm reqs new Rxs for glimepiride and amitriptyline. I have pended glimepiride, but I don't see amitriptyline on her med list. Called pt to check on this and she reported that she needs nortriptyline, not amitriptyline. Gave pt Dr Thompson Caul sch at 102 next 2 weeks as she plans to come in for f/up. Sent in 1 mos RFs of both meds.

## 2015-04-25 NOTE — Progress Notes (Signed)
Remote ICD transmission.   

## 2015-04-25 NOTE — Progress Notes (Signed)
[name], [name] ([id number]) Visit Report for 04/25/2015 HBO Details Patient Name: [name], [name] Date of Service: 04/25/2015 10:00 AM Medical Record Number: [id number] Patient Account Number: [id number] Date of Birth/Sex: 03-05-1956 (59 y.o. 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 HBO Treatment Details Treatment Number: 10 Patient Type: Outpatient Chamber Type: Monoplace Chamber #: HBO [id number] Treatment Protocol: 2.0 ATA with 90 minutes oxygen, and no air breaks Treatment Details Compression Rate Down: 1.5 psi / minute De-Compression Rate Up: 1.5 psi / minute Air breaks and breathing Compress Tx Pressure Decompress Decompress periods Begins Reached Begins Ends (leave unused spaces blank) Chamber Pressure 1 ATA 2.0 ATA - - - - - - 2.0 ATA 1 ATA Clock Time (24 hr) 09:47 09:57 - - - - - - 11:27 11:37 Treatment Length: 110 (minutes) Treatment Segments: 4 Capillary Blood Glucose Pre Capillary Blood Glucose (mg/dl): Post Capillary Blood Glucose (mg/dl): Vital Signs Capillary Blood Glucose Reference Range: 80 - 120 mg / dl HBO Diabetic Blood Glucose Intervention Range: <131 mg/dl or >249 mg/dl Time Vitals Blood Respiratory Capillary Blood Glucose Pulse Action Type: Pulse: Temperature: Taken: Pressure: Rate: Glucose (mg/dl): Meter #: Oximetry (%) Taken: Pre 09:32 88/56 84 18 98 332 1 none Post 11:43 112/56 84 18 98.1 284 1 none Treatment Response Well [name], [name] ([id number]) Treatment Toleration: Treatment Treatment Completed without Adverse Event Completion Status: Electronic  Signature(s) Signed: 04/25/2015 12:11:50 PM By: Christin Fudge MD, FACS Signed: 04/25/2015 3:39:37 PM By: Lorine Bears RCP, RRT, CHT Entered By: Lorine Bears on 04/25/2015 12:01:48 [name] ([id number]) -------------------------------------------------------------------------------- HBO Safety Checklist Details Patient Name: [name] Date of Service: 04/25/2015 10:00 AM Medical Record Number: [id number] Patient Account Number: [id number] Date of Birth/Sex: 1956/03/28 (59 y.o. Female) Treating RN: Primary Care Physician: [name] Other Clinician: Jacqulyn Bath Referring Physician: [name] Treating Physician/Extender: Frann Rider in Treatment: 15 HBO Safety Checklist Items Safety Checklist Consent Form Signed Patient voided / foley secured and emptied When did you last eato 21:00 04/24/15 Last dose of injectable or oral agent 08:45 am Ostomy pouch emptied and vented if applicable n/a All implantable devices assessed, documented and approved ICD Intravenous access site secured and place n/a Valuables secured Linens and cotton and cotton/polyester blend (less than 51% polyester) Personal oil-based products / skin lotions / body lotions removed Wigs or hairpieces removed Smoking or tobacco materials removed Books / newspapers / magazines / loose paper removed Cologne, aftershave, perfume and deodorant removed Jewelry removed (may wrap wedding band) Make-up removed Hair care products removed Battery operated devices (external) removed Heating patches and chemical warmers removed Titanium eyewear removed n/a Nail polish cured greater than 10 hours n/a Casting material cured greater than 10 hours n/a Hearing aids removed n/a Loose dentures or partials removed n/a Prosthetics have been removed n/a Patient demonstrates correct use of air break device (if applicable) Patient concerns have been addressed Patient  grounding bracelet on and cord attached to chamber Specifics for Inpatients (complete in addition to above) Medication sheet sent with patient Intravenous medications needed or due during therapy sent with patient [name], [name] ([id number]) Drainage tubes (e.g. nasogastric tube or chest tube secured and vented) Endotracheal or Tracheotomy tube secured Cuff deflated  of air and inflated with saline Airway suctioned Electronic Signature(s) Signed: 04/25/2015 3:39:37 PM By: Lorine Bears RCP, RRT, CHT Entered By: Becky Sax, Amado Nash on 04/25/2015 09:52:12

## 2015-04-26 ENCOUNTER — Encounter: Payer: PPO | Admitting: Surgery

## 2015-04-26 DIAGNOSIS — L97422 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 layer exposed: Secondary | ICD-10-CM | POA: Diagnosis not present

## 2015-04-26 LAB — CUP PACEART REMOTE DEVICE CHECK
Battery Remaining Longevity: 101 mo
Battery Voltage: 3.01 V
Brady Statistic AP VP Percent: 1.06 %
Brady Statistic AP VS Percent: 0.03 %
Brady Statistic AS VP Percent: 97.3 %
Brady Statistic AS VS Percent: 1.61 %
HighPow Impedance: 63 Ohm
Lead Channel Impedance Value: 304 Ohm
Lead Channel Impedance Value: 342 Ohm
Lead Channel Impedance Value: 399 Ohm
Lead Channel Impedance Value: 437 Ohm
Lead Channel Impedance Value: 532 Ohm
Lead Channel Impedance Value: 570 Ohm
Lead Channel Impedance Value: 570 Ohm
Lead Channel Impedance Value: 589 Ohm
Lead Channel Impedance Value: 589 Ohm
Lead Channel Pacing Threshold Amplitude: 0.5 V
Lead Channel Pacing Threshold Amplitude: 0.625 V
Lead Channel Pacing Threshold Amplitude: 0.875 V
Lead Channel Pacing Threshold Pulse Width: 0.4 ms
Lead Channel Pacing Threshold Pulse Width: 0.4 ms
Lead Channel Sensing Intrinsic Amplitude: 2.125 mV
Lead Channel Sensing Intrinsic Amplitude: 31.625 mV
Lead Channel Setting Pacing Amplitude: 2 V
Lead Channel Setting Pacing Amplitude: 2 V
Lead Channel Setting Pacing Pulse Width: 0.4 ms
Lead Channel Setting Pacing Pulse Width: 0.4 ms
MDC IDC MSMT LEADCHNL LV IMPEDANCE VALUE: 342 Ohm
MDC IDC MSMT LEADCHNL LV IMPEDANCE VALUE: 380 Ohm
MDC IDC MSMT LEADCHNL LV IMPEDANCE VALUE: 456 Ohm
MDC IDC MSMT LEADCHNL LV PACING THRESHOLD PULSEWIDTH: 0.4 ms
MDC IDC MSMT LEADCHNL RV IMPEDANCE VALUE: 532 Ohm
MDC IDC SESS DTM: 20160719052503
MDC IDC SET LEADCHNL RA PACING AMPLITUDE: 1.5 V
MDC IDC SET LEADCHNL RV SENSING SENSITIVITY: 0.3 mV
MDC IDC SET ZONE DETECTION INTERVAL: 250 ms
MDC IDC SET ZONE DETECTION INTERVAL: 300 ms
MDC IDC SET ZONE DETECTION INTERVAL: 450 ms
MDC IDC STAT BRADY RA PERCENT PACED: 1.09 %
MDC IDC STAT BRADY RV PERCENT PACED: 1.66 %
Zone Setting Detection Interval: 350 ms

## 2015-04-26 LAB — GLUCOSE, CAPILLARY
GLUCOSE-CAPILLARY: 288 mg/dL — AB (ref 65–99)
GLUCOSE-CAPILLARY: 251 mg/dL — AB (ref 65–99)

## 2015-04-26 NOTE — Progress Notes (Signed)
SUETTA, ZEIN ([id number]) Visit Report for 04/26/2015 Arrival Information Details Patient Name: [name], [name] Date of Service: 04/26/2015 10:00 AM Medical Record Number: [id number] Patient Account Number: [id number] Date of Birth/Sex: [birth year] (59 y.o. Female) Treating RN: Primary Care Physician: [name] Other Clinician: Jacqulyn Bath Referring Physician: [name] Treating Physician/Extender: BURNS III, Charlean Sanfilippo in Treatment: 15 Visit Information History Since Last Visit Added or deleted any medications: No Patient Arrived: Wheel Chair Any new allergies or adverse reactions: No Arrival Time: 08:40 Had a fall or experienced change in No Accompanied By: husband activities of daily living that may affect Transfer Assistance: Manual risk of falls: Patient Identification Verified: Yes Signs or symptoms of abuse/neglect No Secondary Verification Process Yes since last visito Completed: Hospitalized since last visit: No Patient Requires Transmission- No Has Dressing in Place as Prescribed: Yes Based Precautions: Has Footwear/Offloading in Place as Yes Patient Has Alerts: Yes Prescribed: Patient Alerts: Patient on Blood Left: Surgical Shoe with Thinner Pressure Relief ABI L:0.8, Insole R:0.93 Right: Surgical Shoe with Pressure Relief Insole Pain Present Now: No Electronic Signature(s) Signed: 04/26/2015 10:58:28 AM By: Lorine Bears RCP, RRT, CHT Entered By: Lorine Bears on 04/26/2015 09:20:41 [name] ([id number]) -------------------------------------------------------------------------------- Encounter Discharge Information Details Patient Name: [name] Date of Service: 04/26/2015 10:00 AM Medical Record Number: [id number] Patient Account Number: [id number] Date of Birth/Sex: 1956/01/08 (59 y.o. Female) Treating RN: Primary Care Physician: [name] Other Clinician: Jacqulyn Bath Referring Physician: [name] Treating Physician/Extender: BURNS III, Charlean Sanfilippo in Treatment: 15 Encounter Discharge Information Items Discharge Pain Level: 0 Discharge Condition: Stable Ambulatory Status: Wheelchair Discharge Destination: Home Private Transportation: Auto Accompanied By: husband Schedule Follow-up Appointment: No Medication Reconciliation completed and No provided to Patient/Care Jewelianna Pancoast: Clinical Summary of Care: Notes Patient has an HBO treatment scheduled on 04/27/15 at 10:00 am. Electronic Signature(s) Signed: 04/26/2015 10:58:28 AM By: Lorine Bears RCP, RRT, CHT Entered By: Lorine Bears on 04/26/2015 10:58:12 [name] ([id number]) -------------------------------------------------------------------------------- Vitals Details Patient Name: [name] Date of Service: 04/26/2015 10:00 AM Medical Record Number: [id number] Patient Account Number: [id number] Date of Birth/Sex: [birth year] (59 y.o. Female) Treating RN: Primary Care Physician: [name] Other Clinician: Jacqulyn Bath Referring Physician: [name] Treating Physician/Extender: BURNS III, Charlean Sanfilippo in Treatment: 15 Vital Signs Time Taken: 08:42 Temperature (F): 98.2 Height (in): 68 Pulse (bpm): 84 Weight (lbs): 257 Respiratory Rate (breaths/min): 18 Body Mass Index (BMI): 39.1 Blood Pressure (mmHg): 94/58 Capillary Blood Glucose (mg/dl): 288 Reference Range: 80 - 120 mg / dl Electronic Signature(s) Signed: 04/26/2015 10:58:28 AM By: Lorine Bears RCP, RRT, CHT Entered By: Becky Sax, Amado Nash on 04/26/2015 09:22:27

## 2015-04-26 NOTE — Progress Notes (Signed)
[name], [name] ([id number]) Visit Report for 04/26/2015 HBO Details Patient Name: [name] Date of Service: 04/26/2015 10:00 AM Medical Record Number: [id number] Patient Account Number: [id number] Date of Birth/Sex: 29-Dec-1955 (59 y.o. 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 HBO Treatment Details Treatment Number: 11 Patient Type: Outpatient Chamber Type: Monoplace Chamber #: HBO [id number] Treatment Protocol: 2.0 ATA with 90 minutes oxygen, and no air breaks Treatment Details Compression Rate Down: 1.5 psi / minute De-Compression Rate Up: 1.5 psi / minute Air breaks and breathing Compress Tx Pressure Decompress Decompress periods Begins Reached Begins Ends (leave unused spaces blank) Chamber Pressure 1 ATA 2.0 ATA - - - - - - 2.0 ATA 1 ATA Clock Time (24 hr) 08:57 09:07 - - - - - - 10:37 10:47 Treatment Length: 110 (minutes) Treatment Segments: 4 Capillary Blood Glucose Pre Capillary Blood Glucose (mg/dl): Post Capillary Blood Glucose (mg/dl): Vital Signs Capillary Blood Glucose Reference Range: 80 - 120 mg / dl HBO Diabetic Blood Glucose Intervention Range: <131 mg/dl or >249 mg/dl Time Vitals Blood Respiratory Capillary Blood Glucose Pulse Action Type: Pulse: Temperature: Taken: Pressure: Rate: Glucose (mg/dl): Meter #: Oximetry (%) Taken: Pre 08:42 94/58 84 18 98.2 288 1 none Post 10:52 132/66 84 18 97.6 251 1 none Treatment Response Well [name] ([id number]) Treatment Toleration: Treatment Treatment Completed without Adverse Event Completion Status: HBO  Attestation I certify that I supervised this HBO treatment in accordance with Medicare guidelines. A trained Yes emergency response team is readily available per hospital policies and procedures. Continue HBOT as ordered. Yes Electronic Signature(s) Signed: 04/26/2015 4:18:55 PM By: Loletha Grayer MD Previous Signature: 04/26/2015 10:58:28 AM Version By: Lorine Bears RCP, RRT, CHT Entered By: Loletha Grayer on 04/26/2015 11:23:22 [name] ([id number]) -------------------------------------------------------------------------------- HBO Safety Checklist Details Patient Name: [name] Date of Service: 04/26/2015 10:00 AM Medical Record Number: [id number] Patient Account Number: [id number] Date of Birth/Sex: 08-04-1956 (59 y.o. Female) Treating RN: Primary Care Physician: [name] Other Clinician: Jacqulyn Bath Referring Physician: [name] Treating Physician/Extender: BURNS III, Charlean Sanfilippo in Treatment: 15 HBO Safety Checklist Items Safety Checklist Consent Form Signed Patient voided / foley secured and emptied When did you last eato 21:00 pm 04/25/15 Last dose of injectable or oral agent 08:15 am Ostomy pouch emptied and vented if applicable n/a All implantable devices assessed, documented and approved ICD Intravenous access site secured and place n/a Valuables secured Linens and cotton and cotton/polyester blend (less than 51% polyester) Personal oil-based products / skin lotions / body lotions removed Wigs or hairpieces removed Smoking or tobacco materials removed Books / newspapers / magazines / loose paper removed Cologne, aftershave, perfume and deodorant removed Jewelry removed (may wrap wedding band) Make-up removed Hair care products removed Battery operated devices (external) removed Heating patches and chemical warmers removed Titanium eyewear removed n/a Nail polish cured greater than 10 hours Casting  material cured greater than 10 hours n/a Hearing aids removed n/a Loose dentures or partials removed n/a Prosthetics have been removed n/a Patient demonstrates correct use of air break device (if applicable) Patient concerns have been addressed Patient grounding bracelet on and cord attached to chamber Specifics for Inpatients (complete in addition to  above) Medication sheet sent with patient Intravenous medications needed or due during therapy sent with patient [name], [name] ([id number]) Drainage tubes (e.g. nasogastric tube or chest tube secured and vented) Endotracheal or Tracheotomy tube secured Cuff deflated of air and inflated with saline Airway suctioned Electronic Signature(s) Signed: 04/26/2015 10:58:28 AM By: Lorine Bears RCP, RRT, CHT Entered By: Lorine Bears on 04/26/2015 09:26:00

## 2015-04-27 ENCOUNTER — Encounter: Payer: PPO | Admitting: Surgery

## 2015-04-27 ENCOUNTER — Ambulatory Visit (INDEPENDENT_AMBULATORY_CARE_PROVIDER_SITE_OTHER): Payer: PPO | Admitting: Family Medicine

## 2015-04-27 VITALS — BP 108/50 | HR 81 | Temp 97.6°F | Resp 18 | Ht 68.0 in | Wt 225.6 lb

## 2015-04-27 DIAGNOSIS — L97409 Non-pressure chronic ulcer of unspecified heel and midfoot with unspecified severity: Secondary | ICD-10-CM

## 2015-04-27 DIAGNOSIS — R768 Other specified abnormal immunological findings in serum: Secondary | ICD-10-CM

## 2015-04-27 DIAGNOSIS — K625 Hemorrhage of anus and rectum: Secondary | ICD-10-CM

## 2015-04-27 DIAGNOSIS — L97422 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 layer exposed: Secondary | ICD-10-CM | POA: Diagnosis not present

## 2015-04-27 DIAGNOSIS — G629 Polyneuropathy, unspecified: Secondary | ICD-10-CM

## 2015-04-27 DIAGNOSIS — D509 Iron deficiency anemia, unspecified: Secondary | ICD-10-CM | POA: Diagnosis not present

## 2015-04-27 DIAGNOSIS — M86172 Other acute osteomyelitis, left ankle and foot: Secondary | ICD-10-CM

## 2015-04-27 DIAGNOSIS — I2510 Atherosclerotic heart disease of native coronary artery without angina pectoris: Secondary | ICD-10-CM | POA: Diagnosis not present

## 2015-04-27 DIAGNOSIS — K1370 Unspecified lesions of oral mucosa: Secondary | ICD-10-CM | POA: Diagnosis not present

## 2015-04-27 DIAGNOSIS — L97421 Non-pressure chronic ulcer of left heel and midfoot limited to breakdown of skin: Secondary | ICD-10-CM

## 2015-04-27 DIAGNOSIS — N289 Disorder of kidney and ureter, unspecified: Secondary | ICD-10-CM

## 2015-04-27 DIAGNOSIS — I10 Essential (primary) hypertension: Secondary | ICD-10-CM | POA: Diagnosis not present

## 2015-04-27 DIAGNOSIS — G4733 Obstructive sleep apnea (adult) (pediatric): Secondary | ICD-10-CM | POA: Diagnosis not present

## 2015-04-27 DIAGNOSIS — E1142 Type 2 diabetes mellitus with diabetic polyneuropathy: Secondary | ICD-10-CM | POA: Diagnosis not present

## 2015-04-27 HISTORY — DX: Non-pressure chronic ulcer of unspecified heel and midfoot with unspecified severity: L97.409

## 2015-04-27 LAB — GLUCOSE, POCT (MANUAL RESULT ENTRY): POC Glucose: 435 mg/dl — AB (ref 70–99)

## 2015-04-27 LAB — GLUCOSE, CAPILLARY
GLUCOSE-CAPILLARY: 276 mg/dL — AB (ref 65–99)
Glucose-Capillary: 290 mg/dL — ABNORMAL HIGH (ref 65–99)

## 2015-04-27 LAB — POCT CBC
Granulocyte percent: 68.6 %G (ref 37–80)
HCT, POC: 32.4 % — AB (ref 37.7–47.9)
Hemoglobin: 10.2 g/dL — AB (ref 12.2–16.2)
LYMPH, POC: 1.3 (ref 0.6–3.4)
MCH, POC: 23.5 pg — AB (ref 27–31.2)
MCHC: 31.4 g/dL — AB (ref 31.8–35.4)
MCV: 74.8 fL — AB (ref 80–97)
MID (cbc): 0.4 (ref 0–0.9)
MPV: 6.1 fL (ref 0–99.8)
POC Granulocyte: 3.6 (ref 2–6.9)
POC LYMPH PERCENT: 24.2 %L (ref 10–50)
POC MID %: 7.2 %M (ref 0–12)
Platelet Count, POC: 258 10*3/uL (ref 142–424)
RBC: 4.34 M/uL (ref 4.04–5.48)
RDW, POC: 17 %
WBC: 5.2 10*3/uL (ref 4.6–10.2)

## 2015-04-27 MED ORDER — INSULIN GLARGINE 100 UNIT/ML SOLOSTAR PEN: 10.0000 [IU] | PEN_INJECTOR | Freq: Every day | SUBCUTANEOUS | Status: DC

## 2015-04-27 MED ORDER — NYSTATIN 100000 UNIT/ML MT SUSP: 5.0000 mL | Freq: Four times a day (QID) | OROMUCOSAL | Status: DC

## 2015-04-27 NOTE — Progress Notes (Signed)
NARYA, LUQUIN ([id number]) Visit Report for 04/27/2015 Arrival Information Details Patient Name: [name], [name] Date of Service: 04/27/2015 10:00 AM Medical Record Number: [id number] Patient Account Number: [id number] Date of Birth/Sex: Oct 16, 1955 (59 y.o. Female) Treating RN: Primary Care Physician: [name] Other Clinician: Jacqulyn Bath Referring Physician: [name] Treating Physician/Extender: Frann Rider in Treatment: 15 Visit Information History Since Last Visit Added or deleted any medications: No Patient Arrived: Wheel Chair Any new allergies or adverse reactions: No Arrival Time: 09:30 Had a fall or experienced change in No Accompanied By: husband activities of daily living that may affect Transfer Assistance: Manual risk of falls: Patient Identification Verified: Yes Signs or symptoms of abuse/neglect No Secondary Verification Process Yes since last visito Completed: Hospitalized since last visit: No Patient Requires Transmission- No Has Dressing in Place as Prescribed: Yes Based Precautions: Has Footwear/Offloading in Place as Yes Patient Has Alerts: Yes Prescribed: Patient Alerts: Patient on Blood Left: Surgical Shoe with Thinner Pressure Relief ABI L:0.8, Insole R:0.93 Right: Surgical Shoe with Pressure Relief Insole Pain Present Now: No Electronic Signature(s) Signed: 04/27/2015 3:13:11 PM By: Lorine Bears RCP, RRT, CHT Entered By: Lorine Bears on 04/27/2015 09:33:53 [name] ([id number]) -------------------------------------------------------------------------------- Encounter Discharge Information Details Patient Name: [name] Date of Service: 04/27/2015 10:00 AM Medical Record Number: [id number] Patient Account Number: [id number] Date of Birth/Sex: [birth year] (59 y.o. Female) Treating RN: Primary Care Physician: [name] Other Clinician: Jacqulyn Bath Referring  Physician: [name] Treating Physician/Extender: Frann Rider in Treatment: 15 Encounter Discharge Information Items Discharge Pain Level: 0 Discharge Condition: Stable Ambulatory Status: Wheelchair Discharge Destination: Home Private Transportation: Auto Accompanied By: husband Schedule Follow-up Appointment: No Medication Reconciliation completed and No provided to Patient/Care Bentley Fissel: Clinical Summary of Care: Notes Patient has an HBO treatment scheduled on 04/28/15 at 10:00 am. Electronic Signature(s) Signed: 04/27/2015 3:13:11 PM By: Lorine Bears RCP, RRT, CHT Entered By: Lorine Bears on 04/27/2015 12:09:03 [name] ([id number]) -------------------------------------------------------------------------------- Vitals Details Patient Name: [name] Date of Service: 04/27/2015 10:00 AM Medical Record Number: [id number] Patient Account Number: [id number] Date of Birth/Sex: 30-Jun-1956 (59 y.o. Female) Treating RN: Primary Care Physician: [name] Other Clinician: Jacqulyn Bath Referring Physician: [name] Treating Physician/Extender: Frann Rider in Treatment: 15 Vital Signs Time Taken: 09:28 Temperature (F): 98.1 Height (in): 68 Pulse (bpm): 72 Weight (lbs): 257 Respiratory Rate (breaths/min): 17 Body Mass Index (BMI): 39.1 Blood Pressure (mmHg): 84/47 Capillary Blood Glucose (mg/dl): 290 Reference Range: 80 - 120 mg / dl Electronic Signature(s) Signed: 04/27/2015 3:13:11 PM By: Lorine Bears RCP, RRT, CHT Entered By: Lorine Bears on 04/27/2015 09:34:31

## 2015-04-27 NOTE — Progress Notes (Addendum)
TONIANN, CORY ([id number]) Visit Report for 04/27/2015 Chief Complaint Document Details Patient Name: [name], [name] Date of Service: 04/27/2015 8:45 AM Medical Record Number: [id number] Patient Account Number: [id number] Date of Birth/Sex: 07-17-1956 (59 y.o. Female) Treating RN: Primary Care Physician: [name] Other Clinician: Referring Physician: [name] Treating Physician/Extender: Frann Rider in Treatment: 15 Information Obtained from: Patient Chief Complaint Patient presents to the wound care center for a consult due non healing wound 59 year old patient who comes with a history of an ulcer on the left second toe for at least about 4 months and a history on her left heel for about 3 weeks. I needed to get an MRI of her left foot but due to her defibrillator this could not be done and hence she is getting get a triple phase bone scan. 01/17/2015 -- she has developed a significant injury to the bridge of the nose where her CPAP machine was fitting. This is a painfull dark spot. Electronic Signature(s) Signed: 04/27/2015 9:20:40 AM By: Christin Fudge MD, FACS Entered By: Christin Fudge on 04/27/2015 09:20:40 [name] ([id number]) -------------------------------------------------------------------------------- Debridement Details Patient Name: [name] Date of Service: 04/27/2015 8:45 AM Medical Record Number: [id number] Patient Account Number: [id number] Date of Birth/Sex: 05/17/1956 (59 y.o. Female) Treating RN: Primary Care Physician: [name] Other Clinician: Referring Physician: [name] Treating Physician/Extender: Frann Rider in Treatment: 15 Debridement Performed for Wound #7 Right Toe Second Assessment: Performed By: Physician Pat Patrick., MD Debridement: Debridement Pre-procedure Yes Verification/Time Out Taken: Start Time: 09:13 Pain Control: Lidocaine 4% Topical Solution Level: Skin/Subcutaneous  Tissue Total Area Debrided (L x 0.5 (cm) x 0.8 (cm) = 0.4 (cm) W): Tissue and other Non-Viable, Callus, Exudate, Fibrin/Slough, Subcutaneous material debrided: Instrument: Curette Bleeding: Minimum Hemostasis Achieved: Pressure End Time: 09:18 Procedural Pain: 0 Post Procedural Pain: 0 Response to Treatment: Procedure was tolerated well Post Debridement Measurements of Total Wound Length: (cm) 0.5 Width: (cm) 0.5 Depth: (cm) 0.4 Volume: (cm) 0.079 Electronic Signature(s) Signed: 04/27/2015 9:20:34 AM By: Christin Fudge MD, FACS Previous Signature: 04/27/2015 9:19:18 AM Version By: Regan Lemming BSN, RN Entered By: Christin Fudge on 04/27/2015 09:20:34 [name] ([id number]) -------------------------------------------------------------------------------- HPI Details Patient Name: [name] Date of Service: 04/27/2015 8:45 AM Medical Record Number: [id number] Patient Account Number: [id number] Date of Birth/Sex: [birth year] (59 y.o. Female) Treating RN: Primary Care Physician: [name] Other Clinician: Referring Physician: [name] Treating Physician/Extender: Frann Rider in Treatment: 15 History of Present Illness HPI Description: 59 year old patient who is known to have diabetes for several years and generally has it under good control last hemoglobin A1c was 6.7 done 2 weeks ago. She has several other comorbidities including acute CHF, hypoxia, hyperlipidemia, systolic heart failure, myocardial infarction, coronary artery disease, iron deficiency anemia, renal insufficiency, essential hypertension, morbid obesity and obstructive sleep apnea. 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 In the past she's had a coronary angioplasty with stent in 2013, laparoscopic cholecystectomy in 2011, bilateral oophorectomy in April 2012 and she's had some lumbar disc surgery in 1996. She says she's had a lot of  peripheral neuropathy and due to this she's had a ulcer on her left second toe for a while. She had a dry eschar on her left heel and recently this was removed and she found that she had some infection and some drainage from there. She has minimal pain around the heel but no pain on any areas of  her toes. she has not been on any antibiotics recently and has known vascular problems in the recent past. 01/17/2015 left foot x-ray done on April 5 reveals there are destructive changes noted of the distal aspect of the proximal phalanx of the left second digit with extension into the adjacent proximal interphalangeal joint space. These findings suggest osteomyelitis with possible septic arthritis. I was able to make a call to Dr. Nevada Crane the radiologist, and had a detailed discussion with him regarding this x-ray. He noted that there were also some changes in the fifth phalanx on the left foot and this could have been chronic osteomyelitis too. The second toe on the left leg shows findings which cannot differentiate between an acute or chronic osteomyelitis and hence he has recommended a MRI with and without contrast. We will get this organized for her and in the meanwhile treat her with oral antibiotics which is susceptible to Bactrim. She was seen yesterday by her PCP Dr. Chaya Jan who put her on 3 antibiotics which include Levaquin and doxycycline and Bactrim. 01/24/2015 She is scheduled for a triple PHASE bone scan on 01/26/2015. She is still on antibiotics and otherwise doing well. 01/31/2015 Bone Imaging 3 phase study -- IMPRESSION:1. There is increased uptake on all 3 phases localizing to the left hindfoot region. This may be a manifestation of osteomyelitis. Correlation with exact site of nonhealing ulcer. 2. There is increased uptake localizing to the second phalanx of the left foot on the blood pool and delayed phase images. This is a nonspecific finding and may be related to chronic  neuropathic arthropathy. Underlying infection not excluded. I have personally discussed the x-ray studies with the radiologist Dr. Kerby Moors and after comparing all her films he agrees that the left second phalanx does not have any definite underlying infection and this is [name], [name]. ([id number]) now off a chronic arthropathy. There is a increased uptake in all 3 phases localized to the left hindfoot region and this is a manifestation of osteomyelitis. 02/07/2015 -- She was seen by the ID specialist Dr. Ola Spurr who reviewed her on my request recently. He has recommended a PICC line and IV vancomycin and oral ciprofloxacin for 4 weeks. She will be reviewed by him periodically. He also received several notes from Dr. Sonia Baller to help as documented all the requisites for hyperbaric oxygen therapy. 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 He also had a coronary angioplasty with stent placement in October 2013. Prior to that her 2-D echo showed EF of 35%. Her latest 2-D echo done on 11/01/2014 showed a LV EF of 50-55%. Systolic function and wall motion were within normal limits. Chest x-ray done in January 16, 2015 showed no acute findings. Last hemoglobin A1c done on 12/30/2014 was 6.7. CBC done on the same day was within normal limits, except for a hemoglobin being 8.7 and a hematocrit between 29%. She had arterial duplex study done in August 2015 which showed: No evidence of segmental lower extremity arterial disease at rest, bilaterally. Normal ABI's, bilaterally. Normal great toe pressures, bilaterally. With all the above in place I believe she is a very good candidate for hyperbaric oxygen therapy and I would recommend this with the usual protocol for 6 weeks. she is going to get her PICC line this Friday and the IV antibiotics and when to start. She is also starting on oral ciprofloxacin. 03/03/2015  - recently hospitalized for anemia. No  source identified. Started on amiodarone, contraindication to HBO. Reports left heel feels better. Main complaint is new buttocks ulcers. No fever or chills. Minimal drainage. 03/10/2015 -- There is a new ulceration noticed yesterday on her right second toe at the tip. She also has noticed some ulcerations on her gluteal region and we will need to take a look at this. As far as her medications go she does not know for sure what her dosage of amiodarone is exactly. She will look it up and let me know today. 03/17/2015 -- as far as her dosage of amiodarone she was taking : 02/25/15 - 03/02/15 400 mg per day; 03/03/15 - 03/10/15 200 mg per day. She stopped taking the medication on 03/10/15. She has not yet done the checks x-ray and her PFTs are still pending. Recent Lab work done on 03/13/2015 shows a glucose is 124, K+ was 3.3, her albumen was 2.9, WBC count was 3.4, hemoglobin was 9.3, hematocrit was 30.4 and platelets were 240. C-reactive protein was 33.7 which was high and the Vancomycin trough was 18.6 which was high. 03/24/2015 -- he saw several doctors since I saw her last and they include her medical oncologist, cardiologist, neurologist and infectious disease doctor. Her PICC line is out and she now is on oral Cipro and doxycycline as per her verbal report. She has completed her pulmonary function test yesterday and a recent chest x-ray is within normal limits. If the PFTs within normal limits she will be able to restart the hyperbaric oxygen therapy. 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 Underlying interstitial lung disease to be considered. Further recommendations were that of a CT chest and pulmonary consult if indicated. Electronic Signature(s) Signed: 04/27/2015 9:20:47 AM By: Christin Fudge MD, FACS [name]  ([id number]) Entered By: Christin Fudge on 04/27/2015 09:20:47 [name] ([id number]) -------------------------------------------------------------------------------- Physical Exam Details Patient Name: [name] Date of Service: 04/27/2015 8:45 AM Medical Record Number: [id number] Patient Account Number: [id number] Date of Birth/Sex: 13-Jun-1956 (59 y.o. Female) Treating RN: Primary Care Physician: [name] Other Clinician: Referring Physician: [name] Treating Physician/Extender: Frann Rider in Treatment: 15 Constitutional . Pulse regular. Respirations normal and unlabored. Afebrile. . Eyes Nonicteric. Reactive to light. Ears, Nose, Mouth, and Throat Lips, teeth, and gums WNL.Marland Kitchen Moist mucosa without lesions . Neck supple and nontender. No palpable supraclavicular or cervical adenopathy. Normal sized without goiter. Respiratory WNL. No retractions.. Cardiovascular Pedal Pulses WNL. No clubbing, cyanosis or edema. Chest Breasts symmetical and no nipple discharge.. Breast tissue WNL, no masses, lumps, or tenderness.. Lymphatic No adneopathy. No adenopathy. No adenopathy. Musculoskeletal Adexa without tenderness or enlargement.. Digits and nails w/o clubbing, cyanosis, infection, petechiae, ischemia, or inflammatory conditions.. Integumentary (Hair, Skin) No suspicious lesions. No crepitus or fluctuance. No peri-wound warmth or erythema. No masses.Marland Kitchen Psychiatric Judgement and insight Intact.. No evidence of depression, anxiety, or agitation.. Notes The right second toe looks good and there is minimal eschar and some slough at the tip of the toe and this will be sharply debrided with a curette. Electronic Signature(s) Signed: 04/27/2015 9:22:22 AM By: Christin Fudge MD, FACS Entered By: Christin Fudge on 04/27/2015 [id number] [name] ([id number]) -------------------------------------------------------------------------------- Physician  Orders Details Patient Name: [name] Date of Service: 04/27/2015 8:45 AM Medical Record Number: [id number] Patient Account Number: [id number] Date of Birth/Sex: 08-02-1956 (59 y.o. 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 o May Shower, gently pat wound dry prior to applying new dressing. o May shower with protection. Wound #4 Medial Coccyx o Clean wound with Normal Saline. o May Shower, gently pat wound dry prior to applying new dressing. o May shower with protection. Wound #7 Right Toe Second o Clean wound with Normal Saline. o May Shower, gently pat wound dry prior to applying new dressing. o May shower with protection. Anesthetic Wound #3 Nose o Topical Lidocaine 4% cream applied to wound bed prior to debridement DLYNN, HATAWAY ([id number]) Wound #4 Medial Coccyx o Topical Lidocaine 4% cream applied to wound bed prior to  debridement Wound #7 Right Toe Second o Topical Lidocaine 4% cream applied to wound bed prior to debridement Primary Wound Dressing Wound #3 Nose o Other: - betadine paint Wound #4 Medial Coccyx o Prisma Ag Wound #7 Right Toe Second o Aquacel Ag Secondary Dressing Wound #4 Medial Coccyx o Boardered Foam Dressing Wound #7 Right Toe Second o Gauze and Kerlix/Conform Dressing Change Frequency Wound #3 Nose o Change dressing every other day. Wound #4 Medial Coccyx o Change dressing every other day. Wound #7 Right Toe Second o Change dressing every other day. Follow-up Appointments Wound #3 Nose o Return Appointment in 1 week. Wound #4 Medial Coccyx o Return Appointment in 1 week. Wound #7 Right Toe Second o Return Appointment in 1 week. Off-Loading Wound #4 Medial Coccyx o Turn and reposition every 2 hours VERLE, PATTAN. ([id number]) Wound #7 Right Toe Second o Heel suspension boot to: - Sage boots Additional Orders / Instructions Wound #3 Nose o Increase protein intake. o Activity as tolerated Wound #4 Medial Coccyx o Increase protein intake. o Activity as tolerated Wound #7 Right Toe Second o Increase protein intake. o Activity as tolerated Home Health Wound #3 [place] Visits o Home Health Nurse may visit PRN to address patientos wound care needs. o FACE TO FACE ENCOUNTER: MEDICARE and MEDICAID PATIENTS: I certify that this patient is under my care and that I had a face-to-face encounter that meets the physician face-to-face encounter requirements with this patient on this date. The encounter with the patient was in whole or in part for the following MEDICAL CONDITION: (primary reason for [place]) MEDICAL NECESSITY: I certify, that based on my findings, NURSING services are a medically necessary home health service. HOME BOUND STATUS: I certify that my clinical findings support that this  patient is homebound (i.e., Due to illness or injury, pt requires aid of supportive devices such as crutches, cane, wheelchairs, walkers, the use of special transportation or the assistance of another person to  leave their place of residence. There is a normal inability to leave the home and doing so requires considerable and taxing effort. Other absences are for medical reasons / religious services and are infrequent or of short duration when for other reasons). o If current dressing causes regression in wound condition, may D/C ordered dressing product/s and apply Normal Saline Moist Dressing daily until next [place] / Other MD appointment. [place] of regression in wound condition at [phone]. o Please direct any NON-WOUND related issues/requests for orders to patient's Primary Care Physician Wound #4 [place] Visits o Home Health Nurse may visit PRN to address patientos wound care needs. o FACE TO FACE ENCOUNTER: MEDICARE and MEDICAID PATIENTS: I certify that this patient is under my care and that I had a face-to-face encounter that meets the physician face-to-face encounter requirements with this patient on this date. The encounter with the patient was in whole or in part for the following MEDICAL CONDITION: (primary reason for [place]) MEDICAL NECESSITY: I certify, that based on my findings, NURSING services are a medically necessary home health service. HOME BOUND STATUS: I certify that my clinical findings support that this patient is homebound (i.e., Due to illness or injury, pt requires aid of BEVERLEY, BYNUM. ([id number]) supportive devices such as crutches, cane, wheelchairs, walkers, the use of special transportation or the assistance of another person to leave their place of residence. There is a normal inability to leave the home and doing so requires considerable and taxing effort.  Other absences are for medical reasons / religious services and are infrequent or of short duration when for other reasons). o If current dressing causes regression in wound condition, may D/C ordered dressing product/s and apply Normal Saline Moist Dressing daily until next [place] / Other MD appointment. [place] of regression in wound condition at [phone]. o Please direct any NON-WOUND related issues/requests for orders to patient's Primary Care Physician Wound #7 Right Toe Second o [place] Nurse may visit PRN to address patientos wound care needs. o FACE TO FACE ENCOUNTER: MEDICARE and MEDICAID PATIENTS: I certify that this patient is under my care and that I had a face-to-face encounter that meets the physician face-to-face encounter requirements with this patient on this date. The encounter with the patient was in whole or in part for the following MEDICAL CONDITION: (primary reason for [place]) MEDICAL NECESSITY: I certify, that based on my findings, NURSING services are a medically necessary home health service. HOME BOUND STATUS: I certify that my clinical findings support that this patient is homebound (i.e., Due to illness or injury, pt requires aid of supportive devices such as crutches, cane, wheelchairs, walkers, the use of special transportation or the assistance of another person to leave their place of residence. There is a normal inability to leave the home and doing so requires considerable and taxing effort. Other absences are for medical reasons / religious services and are infrequent or of short duration when for other reasons). o If current dressing causes regression in wound condition, may D/C ordered dressing product/s and apply Normal Saline Moist Dressing daily until next [place] / Other MD appointment. [place] of regression in wound condition  at [phone]. o Please direct any NON-WOUND related issues/requests for orders to patient's Primary Care Physician Electronic Signature(s) Signed: 04/27/2015 9:30:20 AM By: Regan Lemming BSN, RN Signed: 04/27/2015 11:59:42  AM By: Christin Fudge MD, FACS Entered By: Regan Lemming on 04/27/2015 09:30:20 [name] ([id number]) -------------------------------------------------------------------------------- Problem List Details Patient Name: [name] Date of Service: 04/27/2015 8:45 AM Medical Record Number: [id number] Patient Account Number: [id number] Date of Birth/Sex: May 24, 1956 (59 y.o. Female) Treating RN: Primary Care Physician: [name] Other Clinician: Referring Physician: [name] Treating Physician/Extender: Frann Rider in Treatment: 15 Active Problems ICD-10 Encounter Code Description Active Date Diagnosis E11.621 Type 2 diabetes mellitus with foot ulcer 01/10/2015 Yes L97.422 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 01/10/2015 Yes layer exposed L97.522 Non-pressure chronic ulcer of other part of left foot with fat 01/10/2015 Yes layer exposed [id number] Chronic systolic (congestive) heart failure 01/10/2015 Yes E11.42 Type 2 diabetes mellitus with diabetic polyneuropathy 01/10/2015 Yes S00.33XA Contusion of nose, initial encounter 01/17/2015 Yes 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, left ankle and foot 02/13/2015 Yes 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, stage 3 03/03/2015 Yes L89.323 Pressure ulcer of left buttock, stage 3 03/03/2015 Yes L97.512 Non-pressure chronic ulcer of other part of right foot with 03/10/2015 Yes fat layer exposed NOVELLA, HUBLEY ([id number]) L02.611 Cutaneous abscess of right foot 04/20/2015 Yes Inactive Problems Resolved Problems Electronic Signature(s) Signed: 04/27/2015 9:18:48 AM By: Christin Fudge MD, FACS Entered By: Christin Fudge on 04/27/2015 09:18:48 [name]  ([id number]) -------------------------------------------------------------------------------- Progress Note Details Patient Name: [name] Date of Service: 04/27/2015 8:45 AM Medical Record Number: [id number] Patient Account Number: [id number] Date of Birth/Sex: [birth year] (59 y.o. Female) Treating RN: Primary Care Physician: [name] Other Clinician: Referring Physician: [name] Treating Physician/Extender: Frann Rider in Treatment: 15 Subjective Chief Complaint Information obtained from Patient Patient presents to the wound care center for a consult due non healing wound 59 year old patient who comes with a history of an ulcer on the left second toe for at least about 4 months and a history on her left heel for about 3 weeks. I needed to get an MRI of her left foot but due to her defibrillator this could not be done and hence she is getting get a triple phase bone scan. 01/17/2015 -- she has developed a significant injury to the bridge of the nose where her CPAP machine was fitting. This is a painfull dark spot. History of Present Illness (HPI) 59 year old patient who is known to have diabetes for several years and generally has it under good control last hemoglobin A1c was 6.7 done 2 weeks ago. She has several other comorbidities including acute CHF, hypoxia, hyperlipidemia, systolic heart failure, myocardial infarction, coronary artery disease, iron deficiency anemia, renal insufficiency, essential hypertension, morbid obesity and obstructive sleep apnea. 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 In the past she's had a coronary angioplasty with stent in 2013, laparoscopic cholecystectomy in 2011, bilateral oophorectomy in April 2012 and she's had some lumbar disc surgery in 1996. She says she's had a lot of peripheral neuropathy and due to this she's had a ulcer on her left second toe for a while. She had a  dry eschar on her left heel and recently this was removed and she found that she had some infection and some drainage from there. She has minimal pain around the heel but no pain on any areas of her toes. she has not been on any antibiotics recently and has known vascular problems in the recent past. 01/17/2015 left foot x-ray done on April 5 reveals there are destructive changes noted of the distal  aspect of the proximal phalanx of the left second digit with extension into the adjacent proximal interphalangeal joint space. These findings suggest osteomyelitis with possible septic arthritis. I was able to make a call to Dr. Nevada Crane the radiologist, and had a detailed discussion with him regarding this x-ray. He noted that there were also some changes in the fifth phalanx on the left foot and this could have been chronic osteomyelitis too. The second toe on the left leg shows findings which cannot differentiate between an acute or chronic osteomyelitis and hence he has recommended a MRI with and without contrast. We will get this organized for her and in the meanwhile treat her with oral antibiotics which is susceptible to Bactrim. She was seen yesterday by her PCP Dr. Chaya Jan who put her on 3 antibiotics which include Levaquin and doxycycline and Bactrim. 01/24/2015 She is scheduled for a triple PHASE bone scan on 01/26/2015. She is still on antibiotics and otherwise doing well. CATALAYA, GALLARDO ([id number]) 01/31/2015 Bone Imaging 3 phase study -- IMPRESSION:1. There is increased uptake on all 3 phases localizing to the left hindfoot region. This may be a manifestation of osteomyelitis. Correlation with exact site of nonhealing ulcer. 2. There is increased uptake localizing to the second phalanx of the left foot on the blood pool and delayed phase images. 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 Underlying infection not excluded. I have  personally discussed the x-ray studies with the radiologist Dr. Kerby Moors and after comparing all her films he agrees that the left second phalanx does not have any definite underlying infection and this is now off a chronic arthropathy. There is a increased uptake in all 3 phases localized to the left hindfoot region and this is a manifestation of osteomyelitis. 02/07/2015 -- She was seen by the ID specialist Dr. Ola Spurr who reviewed her on my request recently. He has recommended a PICC line and IV vancomycin and oral ciprofloxacin for 4 weeks. She will be reviewed by him periodically. He also received several notes from Dr. Sonia Baller to help as documented all the requisites for hyperbaric oxygen therapy. 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 He also had a coronary angioplasty with stent placement in October 2013. Prior to that her 2-D echo showed EF of 35%. Her latest 2-D echo done on 11/01/2014 showed a LV EF of 50-55%. Systolic function and wall motion were within normal limits. Chest x-ray done in January 16, 2015 showed no acute findings. Last hemoglobin A1c done on 12/30/2014 was 6.7. CBC done on the same day was within normal limits, except for a hemoglobin being 8.7 and a hematocrit between 29%. She had arterial duplex study done in August 2015 which showed: No evidence of segmental lower extremity arterial disease at rest, bilaterally. Normal ABI's, bilaterally. Normal great toe pressures, bilaterally. With all the above in place I believe she is a very good candidate for hyperbaric oxygen therapy and I would recommend this with the usual protocol for 6 weeks. she is going to get her PICC line this Friday and the IV antibiotics and when to start. She is also starting on oral ciprofloxacin. 03/03/2015 - recently hospitalized for anemia. No source identified. Started on amiodarone, contraindication to  HBO. Reports left heel feels better. Main complaint is new buttocks ulcers. No fever or chills. Minimal drainage. 03/10/2015 -- There is a new ulceration noticed yesterday on her  right second toe at the tip. She also has noticed some ulcerations on her gluteal region and we will need to take a look at this. As far as her medications go she does not know for sure what her dosage of amiodarone is exactly. She will look it up and let me know today. 03/17/2015 -- as far as her dosage of amiodarone she was taking : 02/25/15 - 03/02/15 400 mg per day; 03/03/15 - 03/10/15 200 mg per day. She stopped taking the medication on 03/10/15. She has not yet done the checks x-ray and her PFTs are still pending. Recent Lab work done on 03/13/2015 shows a glucose is 124, K+ was 3.3, her albumen was 2.9, WBC count was 3.4, hemoglobin was 9.3, hematocrit was 30.4 and platelets were 240. C-reactive protein was 33.7 which was high and the Vancomycin trough was 18.6 which was high. 03/24/2015 -- he saw several doctors since I saw her last and they include her medical oncologist, cardiologist, neurologist and infectious disease doctor. Her PICC line is out and she now is on oral Cipro and doxycycline as per her verbal report. She has completed her pulmonary function test yesterday and a WANELL, [place]. ([id number]) recent chest x-ray is within normal limits. If the PFTs within normal limits she will be able to restart the hyperbaric oxygen therapy. 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 Underlying interstitial lung disease to be considered. Further recommendations were that of a CT chest and pulmonary consult if indicated. Objective Constitutional Pulse regular. Respirations normal and unlabored. Afebrile. Vitals Time Taken: 8:59 AM, Height: 68 in, Weight: 257 lbs, BMI: 39.1, Temperature: 98.1  F, Pulse: 72 bpm, Respiratory Rate: 17 breaths/min, Blood Pressure: 84/47 mmHg. Eyes Nonicteric. Reactive to light. Ears, Nose, Mouth, and Throat Lips, teeth, and gums WNL.Marland Kitchen Moist mucosa without lesions . Neck supple and nontender. No palpable supraclavicular or cervical adenopathy. Normal sized without goiter. Respiratory WNL. No retractions.. Cardiovascular Pedal Pulses WNL. No clubbing, cyanosis or edema. Chest Breasts symmetical and no nipple discharge.. Breast tissue WNL, no masses, lumps, or tenderness.. Lymphatic No adneopathy. No adenopathy. No adenopathy. Musculoskeletal Adexa without tenderness or enlargement.. Digits and nails w/o clubbing, cyanosis, infection, petechiae, ischemia, or inflammatory conditions.Marland Kitchen Psychiatric Judgement and insight Intact.. No evidence of depression, anxiety, or agitation.Marland Kitchen [name], [name] ([id number]) General Notes: The right second toe looks good and there is minimal eschar and some slough at the tip of the toe and this will be sharply debrided with a curette. Integumentary (Hair, Skin) No suspicious lesions. No crepitus or fluctuance. No peri-wound warmth or erythema. No masses.. Wound #3 status is Open. Original cause of wound was Pressure Injury. The wound is located on the Nose. The wound measures 0.7cm length x 0.5cm width x 0.1cm depth; 0.275cm^2 area and 0.027cm^3 volume. Wound #4 status is Open. Original cause of wound was Pressure Injury. The wound is located on the Medial Coccyx. The wound measures 1cm length x 0.5cm width x 0.1cm depth; 0.393cm^2 area and 0.039cm^3 volume. Wound #7 status is Open. Original cause of wound was Gradually Appeared. The wound is located on the Right Toe Second. The wound measures 0.5cm length x 0.8cm width x 0.4cm depth; 0.314cm^2 area and 0.126cm^3 volume. 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 The tip of her right total need some sharp debridement and the gluteal region looks clean and we will continue with local care. We are using silver alginate on her right second toe and Prisma on the gluteal region. She will come back and see me next week. Procedures ALIXANDRIA, LUDLAM ([id number]) Wound #7 Wound #7 is a Diabetic Wound/Ulcer of the Lower Extremity located on the Right Toe Second . There was a Skin/Subcutaneous Tissue Debridement [id number]) debridement with total area of 0.4 sq cm performed by Pat Patrick., MD. with the following instrument(s): Curette to remove Non-Viable tissue/material including Exudate, Fibrin/Slough, Callus, and Subcutaneous after achieving pain control using Lidocaine 4% Topical Solution. A time out was conducted prior to the start of the procedure. A Minimum amount of bleeding was controlled with Pressure. The procedure was tolerated well with a pain level of 0 throughout and a pain level of 0 following the procedure. Post Debridement Measurements: 0.5cm length x 0.5cm width x 0.4cm depth; 0.079cm^3 volume. Plan Wound Cleansing: Wound #3 Nose: Clean wound with Normal Saline. May Shower, gently pat wound dry prior to applying new dressing. May shower with protection. Wound #4 Medial  Coccyx: Clean wound with Normal Saline. May Shower, gently pat wound dry prior to applying new dressing. May shower with protection. Wound #7 Right Toe Second: Clean wound with Normal Saline. May Shower, gently pat wound dry prior to applying new dressing. May shower with protection. Anesthetic: Wound #3 Nose: Topical Lidocaine 4% cream applied to wound bed prior to debridement Wound #4 Medial Coccyx: Topical Lidocaine 4% cream applied to wound bed prior to debridement Wound #7 Right Toe Second: Topical Lidocaine 4% cream applied to wound bed prior to debridement Primary Wound Dressing: Wound #3 Nose: Other: - betadine paint Wound #4 Medial Coccyx: Prisma Ag Wound #7 Right Toe Second: Aquacel Ag Secondary Dressing: Wound #4 Medial Coccyx: Boardered Foam Dressing Wound #7 Right Toe Second: Gauze and Kerlix/Conform Dressing Change Frequency: JEFFERY, BRUFF ([id number]) Wound #3 Nose: Change dressing every other day. Wound #4 Medial Coccyx: Change dressing every other day. Wound #7 Right Toe Second: Change dressing every other day. Follow-up Appointments: Wound #3 Nose: Return Appointment in 1 week. Wound #4 Medial Coccyx: Return Appointment in 1 week. Wound #7 Right Toe Second: Return Appointment in 1 week. Off-Loading: Wound #4 Medial Coccyx: Turn and reposition every 2 hours Wound #7 Right Toe Second: Heel suspension boot to: - Sage boots Additional Orders / Instructions: Wound #3 Nose: Increase protein intake. Activity as tolerated Wound #4 Medial Coccyx: Increase protein intake. Activity as tolerated Wound #7 Right Toe Second: Increase protein intake. Activity as tolerated Home Health: Wound #3 Nose: Continue Home Health Visits Home Health Nurse may visit PRN to address patient s wound care needs. FACE TO FACE ENCOUNTER: MEDICARE and MEDICAID PATIENTS: I certify that this patient is under my care and that I had a face-to-face encounter  that meets  the physician face-to-face encounter requirements with this patient on this date. The encounter with the patient was in whole or in part for the following MEDICAL CONDITION: (primary reason for [place]) MEDICAL NECESSITY: I certify, that based on my findings, NURSING services are a medically necessary home health service. HOME BOUND STATUS: I certify that my clinical findings support that this patient is homebound (i.e., Due to illness or injury, pt requires aid of supportive devices such as crutches, cane, wheelchairs, walkers, the use of special transportation or the assistance of another person to leave their place of residence. There is a normal inability to leave the home and doing so requires considerable and taxing effort. Other absences are for medical reasons / religious services and are infrequent or of short duration when for other reasons). If current dressing causes regression in wound condition, may D/C ordered dressing product/s and apply Normal Saline Moist Dressing daily until next [place] / Other MD appointment. [place] of regression in wound condition at [phone]. Please direct any NON-WOUND related issues/requests for orders to patient's Primary Care Physician Wound #4 Medial Coccyx: [place] Nurse may visit PRN to address patient s wound care needs. FACE TO FACE ENCOUNTER: MEDICARE and MEDICAID PATIENTS: I certify that this patient is under my care and that I had a face-to-face encounter that meets the physician face-to-face encounter DRINDA, HASBERRY ([id number]) requirements with this patient on this date. The encounter with the patient was in whole or in part for the following MEDICAL CONDITION: (primary reason for [place]) MEDICAL NECESSITY: I certify, that based on my findings, NURSING services are a medically necessary home health service. HOME BOUND STATUS: I certify that my  clinical findings support that this patient is homebound (i.e., Due to illness or injury, pt requires aid of supportive devices such as crutches, cane, wheelchairs, walkers, the use of special transportation or the assistance of another person to leave their place of residence. There is a normal inability to leave the home and doing so requires considerable and taxing effort. Other absences are for medical reasons / religious services and are infrequent or of short duration when for other reasons). If current dressing causes regression in wound condition, may D/C ordered dressing product/s and apply Normal Saline Moist Dressing daily until next [place] / Other MD appointment. [place] of regression in wound condition at [phone]. Please direct any NON-WOUND related issues/requests for orders to patient's Primary Care Physician Wound #7 Right Toe Second: [place] Nurse may visit PRN to address patient s wound care needs. FACE TO FACE ENCOUNTER: MEDICARE and MEDICAID PATIENTS: I certify that this patient is under my care and that I had a face-to-face encounter that meets the physician face-to-face encounter requirements with this patient on this date. The encounter with the patient was in whole or in part for the following MEDICAL CONDITION: (primary reason for [place]) MEDICAL NECESSITY: I certify, that based on my findings, NURSING services are a medically necessary home health service. HOME BOUND STATUS: I certify that my clinical findings support that this patient is homebound (i.e., Due to illness or injury, pt requires aid of supportive devices such as crutches, cane, wheelchairs, walkers, the use of special transportation or the assistance of another person to leave their place of residence. There is a normal inability to leave the home and doing so requires considerable and taxing effort.  Other absences are for  medical reasons / religious services and are infrequent or of short duration when for other reasons). If current dressing causes regression in wound condition, may D/C ordered dressing product/s and apply Normal Saline Moist Dressing daily until next [place] / Other MD appointment. [place] of regression in wound condition at [phone]. Please direct any NON-WOUND related issues/requests for orders to patient's Primary Care Physician The patient has had good benefit of hyperbaric oxygen therapy and we will continue this. The tip of her right total need some sharp debridement and the gluteal region looks clean and we will continue with local care. We are using silver alginate on her right second toe and Prisma on the gluteal region. She will come back and see me next week. Electronic Signature(s) Signed: 04/27/2015 12:02:12 PM By: Christin Fudge MD, FACS Previous Signature: 04/27/2015 9:23:43 AM Version By: Christin Fudge MD, FACS Entered By: Christin Fudge on 04/27/2015 12:02:12 [name] ([id number]) -------------------------------------------------------------------------------- SuperBill Details Patient Name: [name] Date of Service: 04/27/2015 Medical Record Number: [id number] Patient Account Number: [id number] Date of Birth/Sex: 08-13-1956 (59 y.o. Female) Treating RN: Primary Care Physician: [name] Other Clinician: Referring Physician: [name] Treating Physician/Extender: Frann Rider in Treatment: 15 Diagnosis Coding ICD-10 Codes Code Description E11.621 Type 2 diabetes mellitus with foot ulcer L97.422 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 layer exposed L97.522 Non-pressure chronic ulcer of other part of left foot with fat layer exposed [id number] Chronic systolic (congestive) heart failure E11.42 Type 2 diabetes mellitus with diabetic polyneuropathy S00.33XA Contusion of nose, initial  encounter 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, 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, stage 3 L89.323 Pressure ulcer of left buttock, stage 3 L97.512 Non-pressure chronic ulcer of other part of right foot with fat layer exposed L02.611 Cutaneous abscess of right foot Facility Procedures CPT4 Code Description: [id number] - DEB SUBQ TISSUE 20 SQ CM/< ICD-10 Description Diagnosis E11.621 Type 2 diabetes mellitus with foot ulcer L97.512 Non-pressure chronic ulcer of other part of right foo Modifier: t with fat la Quantity: 1 yer exposed Physician Procedures CPT4 Code Description: [id number] - WC PHYS SUBQ TISS 20 SQ CM ICD-10 Description Diagnosis E11.621 Type 2 diabetes mellitus with foot ulcer L97.512 Non-pressure chronic ulcer of other part of right foo Modifier: t with fat lay Quantity: 1 er exposed Electronic Signature(s) Signed: 04/27/2015 9:24:01 AM By: Christin Fudge MD, FACS [name] ([id number]) Entered By: Christin Fudge on 04/27/2015 09:24:01

## 2015-04-27 NOTE — Progress Notes (Addendum)
Subjective:    Patient ID: [name], female    DOB: Apr 22, 1956, 59 y.o.   MRN: [id number] This chart was scribed for Nilda Simmer, MD by Littie Deeds, Medical Scribe. This patient was seen in Room 1 and the patient's care was started at 5:45 PM.    04/27/2015  Diabetes; blood pressure; and Hospitalization Follow-up   HPI  HPI Comments: [name] is a 59 y.o. female with a history of DM, CAD/CHF, hypertension, iron deficiency anemia, polyneuropathy, and OSA who presents to the [facility] for a TRANSITION INTO CARE and for follow-up for DM and hypertension. Her blood pressure has been running low. She was also admitted in May of 2016 for severe anemia with hemoglobin 6.7. She stayed in the hospital for a week. She had an EGD which was normal but she had a colonoscopy which revealed 2 polyps. She was referred to hematology for iron infusions; she has had 3 infusions. She was last seen 03/13/15. Unresponsive to oral iron therapy. She also had a positive ANA in April and she had a referral to rheumatology placed. Patient was not thought to have lupus. She had an appointment with Dr. Nickola Major, rheumatology. She is also being followed by neurology for peripheral neuropathy that had worsened. Patient will see neurology tomorrow for a spinal tap. She also had osteomyelitis of her left heel, which has been treated at the wound center at [facility] with antibiotics. She was treated with Vancomycin for 4 weeks; her PICC line has been removed since. She is currently being treated with hyperbaric oxygen 5 days a week for 6 weeks; she still has 4 weeks remaining. She reports having some sores to her lower lip and tongue starting a few weeks ago. Her left heel has since healed, but she is currently on Cipro for a toe infection.  Patient is concerned about her low blood pressures. She does report having some intermittent dizziness, but she is unsure if this is related to  her blood pressure. Patient did have some leg swelling up to her thighs while she was in the hospital; this took 3-4 weeks before it resolved. She does not have leg swelling currently.  Patient notes her blood sugar has been high. This morning, her blood sugar was over 200. She did have an episode of speech difficulty; her husband did call 911 for this. It was found that her blood sugar was below 40 at that time.   While she was in the hospital, she had some left arm swelling. She was diagnosed with a blood clot in her arm with an [place]. She was given a course of medications for 2 weeks for this, but she does not remember the name of the medication. She also reports having a bed sore on her buttocks, for which she has been using a topical cream for.  Patient is still using her CPAP machine.  Her grandson recently broke his neck while riding a bicycle.  Review of Systems  Constitutional: Negative for fever, chills, diaphoresis and fatigue.  HENT: Positive for mouth sores.   Eyes: Negative for visual disturbance.  Respiratory: Negative for cough and shortness of breath.   Cardiovascular: Negative for chest pain, palpitations and leg swelling.  Gastrointestinal: Negative for nausea, vomiting, abdominal pain, diarrhea, constipation, blood in stool, abdominal distention, anal bleeding and rectal pain.  Endocrine: Negative for cold intolerance, heat intolerance, polydipsia, polyphagia and polyuria.  Skin: Positive for color change and wound.  Neurological: Positive for dizziness, weakness, light-headedness and numbness. Negative for tremors, seizures, syncope, facial asymmetry, speech difficulty and headaches.    Past Medical History  Diagnosis Date  . Obesity, unspecified   . Unspecified vitamin D deficiency   . Pure hypercholesterolemia   . Neuropathy 2011  . Chicken pox   . Chronic systolic CHF (congestive heart failure)     a. mixed ICM & NICM; b. EF 20-25% by echo 07/2012, mid-dist 2/3 of  LV sev HK/AK, mild MR. echo 10/2012: EF 30-35%, sev HK ant-septal & inf walls, GR1DD, mild MR, PASP 33. c. echo 02/2013: EF 30%, GR1DD, mild MR. echo 04/2014: EF 30%, Septal-lat dyssynchrony, global HK, inf AK, GR1DD, mild MR. d. echo 10/2014: EF50-55%, WM nl, GR1DD, septal mild paradox. e. echo 02/2015: EF 50-55%, wm   . Hypertension   . Anemia   . Automatic implantable cardioverter-defibrillator in situ     a. MDT CRT-D 06/2014, SN: [id number]    . Type II diabetes mellitus   . CAD (coronary artery disease)     a. cardiac cath 101/04/2012: PCI/DES to chronically occluded mLAD, consideration PCI to diag branch in 4 weeks.   Marland Kitchen History of blood transfusion ~ 2011    "plasma; had neuropathy; couldn't walk"  . OSA on CPAP     Moderate with AHI 23/hr and now on CPAP at 16cm H2O.  Her DME is [facility]  . LBBB (left bundle branch block)    Past Surgical History  Procedure Laterality Date  . Lumbar disc surgery  1996    L4-5  . Incision and drainage abscess Right 2007    groin; with ICU stay due to sepsis.  . Bilateral oophorectomy  01/2011    ovarian cyst benign  . Bi-ventricular implantable cardioverter defibrillator  (crt-d)  07/06/2014  . Laparoscopic cholecystectomy  2011  . Back surgery    . Coronary angioplasty with stent placement Left 07/2012    new onset systolic CHF; elevated troponins.  Cardiac catheterization with stenting to LAD; EF 15%.  2D-echo: EF 20-25%.  . Vaginal hysterectomy  01/2011    Fibroids/DUB.  Ovaries removed. Fontaine.  . Tubal ligation  1981  . Left heart catheterization with coronary angiogram N/A 07/21/2012    Procedure: LEFT HEART CATHETERIZATION WITH CORONARY ANGIOGRAM;  Surgeon: Dolores Patty, MD;  Location: [facility] CATH LAB;  Service: Cardiovascular;  Laterality: N/A;  . Percutaneous coronary stent intervention (pci-s) N/A 07/23/2012    Procedure: PERCUTANEOUS CORONARY STENT INTERVENTION (PCI-S);  Surgeon: Tonny Bollman, MD;  Location: [facility] CATH LAB;  Service:  Cardiovascular;  Laterality: N/A;  . Bi-ventricular implantable cardioverter defibrillator N/A 07/06/2014    Procedure: BI-VENTRICULAR IMPLANTABLE CARDIOVERTER DEFIBRILLATOR  (CRT-D);  Surgeon: Duke Salvia, MD;  Location: [facility] CATH LAB;  Service: Cardiovascular;  Laterality: N/A;  . Colonoscopy with propofol Left 02/22/2015    Procedure: COLONOSCOPY WITH PROPOFOL;  Surgeon: Wallace Cullens, MD;  Location: [facility] ENDOSCOPY;  Service: Endoscopy;  Laterality: Left;  . Esophagogastroduodenoscopy N/A 02/22/2015    Procedure: ESOPHAGOGASTRODUODENOSCOPY (EGD);  Surgeon: Wallace Cullens, MD;  Location[facility] ENDOSCOPY;  Service: Endoscopy;  Laterality: N/A;  . Peripheral vascular catheterization N/A 02/10/2015    Procedure: Picc Line Insertion;  Surgeon: Renford Dills, MD;  Location: [facility] INVASIVE CV LAB;  Service: Cardiovascular;  Laterality: N/A;   No Known Allergies Current Outpatient Prescriptions  Medication Sig Dispense Refill  . aspirin EC 81 MG tablet Take 1 tablet (81 mg total) by mouth daily. 90 tablet  3  . ciprofloxacin (CIPRO) 500 MG tablet Take 500 mg by mouth 2 (two) times daily.    Marland Kitchen gabapentin (NEURONTIN) 600 MG tablet Take 1 tablet (600 mg total) by mouth 3 (three) times daily. 270 tablet 3  . glimepiride (AMARYL) 4 MG tablet Take 1 tablet (4 mg total) by mouth 2 (two) times daily before a meal. (Patient not taking: Reported on 05/02/2015) 180 tablet 3  . isosorbide mononitrate (IMDUR) 60 MG 24 hr tablet Take 1 tablet (60 mg total) by mouth daily. 90 tablet 3  . metFORMIN (GLUCOPHAGE) 1000 MG tablet TAKE 1 TABLET BY MOUTH ONCE A DAY (Patient not taking: Reported on 05/02/2015) 90 tablet 0  . nitroGLYCERIN (NITROSTAT) 0.4 MG SL tablet Place 1 tablet (0.4 mg total) under the tongue every 5 (five) minutes as needed for chest pain. 30 tablet 0  . pravastatin (PRAVACHOL) 40 MG tablet Take 1 tablet (40 mg total) by mouth at bedtime. 90 tablet 3  . sitaGLIPtin (JANUVIA) 100 MG tablet Take 1 tablet (100 mg  total) by mouth daily. (Patient not taking: Reported on 05/02/2015) 90 tablet 0  . torsemide (DEMADEX) 20 MG tablet Take 2 tablets (40 mg total) by mouth daily. 60 tablet 3  . carvedilol (COREG) 6.25 MG tablet Take 4 tablets (25 mg total) by mouth 2 (two) times daily with a meal. 60 tablet 0  . Insulin Glargine (LANTUS) 100 UNIT/ML Solostar Pen Inject 10 Units into the skin daily at 10 pm. 15 mL 11  . nortriptyline (PAMELOR) 50 MG capsule TAKE 1 CAPSULE BY MOUTH EVERY NIGHT AT BEDTIME 30 capsule 2  . nystatin (MYCOSTATIN) 100000 UNIT/ML suspension Take 5 mLs (500,000 Units total) by mouth 4 (four) times daily. 200 mL 0   No current facility-administered medications for this visit.   Social History   Social History  . Marital Status: Married    Spouse Name: N/A  . Number of Children: 2  . Years of Education: 12   Occupational History  . disabled     03/2010 for peripheral neuropathy  . home daycare     x 20 yrs.   Social History Main Topics  . Smoking status: Never Smoker   . Smokeless tobacco: Never Used  . Alcohol Use: No  . Drug Use: No  . Sexual Activity: No   Other Topics Concern  . Not on file   Social History Narrative   Marital status:Married x 42 yrs. Happily married, no abuse.       Children:  2 children (daughter 71, son 46). One grandson and 2 step grandchildren. Daughter and grandson live in home with pt and spouse.       Lives: with husband.      Employment: disability for peripheral neuropathy 2012.  Previously had home daycare.      Tobacco: none      Alcohol: none      Drugs: none      Exercise: none.  [employer].      Pets: dog.      Always uses seat belts, smoke detectors in home.      No guns in the home.       Caffeine use: 2 cups coffee per day.       Nutrition: Well balanced diet.   Family History  Problem Relation Age of Onset  . Diabetes Mother   . Hypertension Mother   . Arthritis Mother     knees, lumbar DDD, cervical DDD  .  Cancer Father     prostate,skin,lymphoma.  . Obesity Brother   . Diabetes Maternal Grandmother   . Diabetes Paternal Grandmother         Objective:    BP 108/50 mmHg  Pulse 81  Temp(Src) 97.6 F (36.4 C) (Oral)  Resp 18  Ht 5\' 8"  (1.727 m)  Wt 225 lb 9.6 oz (102.331 kg)  BMI 34.31 kg/m2  SpO2 99% Physical Exam  Constitutional: She is oriented to person, place, and time. She appears well-developed and well-nourished. No distress.  obese  HENT:  Head: Normocephalic and atraumatic.  Right Ear: External ear normal.  Left Ear: External ear normal.  Nose: Nose normal.  Mouth/Throat: Oropharynx is clear and moist.  Tiny vesicles of the mucosa of her lower lip.  Eyes: Conjunctivae and EOM are normal. Pupils are equal, round, and reactive to light.  Neck: Normal range of motion. Neck supple. Carotid bruit is not present. No thyromegaly present.  Cardiovascular: Normal rate, regular rhythm, normal heart sounds and intact distal pulses.  Exam reveals no gallop and no friction rub.   No murmur heard. Pulmonary/Chest: Effort normal and breath sounds normal. She has no wheezes. She has no rales.  Abdominal: Soft. Bowel sounds are normal. She exhibits no distension and no mass. There is no tenderness. There is no rebound and no guarding.  Musculoskeletal: She exhibits no edema.  Lymphadenopathy:    She has no cervical adenopathy.  Neurological: She is alert and oriented to person, place, and time. No cranial nerve deficit.  Skin: Skin is warm and dry. No rash noted. She is not diaphoretic. No erythema. No pallor.  Scattered eschar scattered along facial area with normal healing.   Psychiatric: She has a normal mood and affect. Her behavior is normal.  Vitals reviewed.       Assessment & Plan:   1. Acute osteomyelitis involving ankle and foot, left   2. Gastrointestinal hemorrhage associated with anorectal source   3. Heel ulcer, left, limited to breakdown of skin   4.  Polyneuropathy   5. Type 2 diabetes mellitus with diabetic polyneuropathy   6. OSA (obstructive sleep apnea)   7. Iron deficiency anemia   8. Essential hypertension, benign   9. Coronary artery disease involving native coronary artery of native heart without angina pectoris   10. Mouth lesion   11. Renal insufficiency   12. ANA positive     1. Acute osteomyelitis:  New.  S/p six weeks of iv vancomycin therapy; now undergoing daily hyperbaric oxygen therapy.  Followed by wound center. 2.  L heel ulcer: with osteomyelitis: improving; followed by the wound center. 3.  GI Bleed: New. S/p EGD and colonoscopy; s/p admission; records reviewed.   4.  Anemia iron deficiency: worsening warranting admission and transfusion; s/p 3 iron infusions per hematology. 5.  Polyneuropathy: improving since last visit; followed by neurology; scheduled for lumbar puncture this week.   6. ANA +: New; s/p rheumatology consultation; no evidence of SLE. 7.  DMII: uncontrolled; rx for Lantus 10 units qhs prescribed. Stop Metformin; decrease Januvia to 100mg  1/2 daily.  8.  OSA: stable; compliance with CPAP. 9.  HTN: with low readings today; decrease Carvedilol 25mg  to 1/2 bid.  10. Mouth lesion: New. Rx for Nystatin swish and swallow provided. 11.  CAD: stable; asymptomatic. 12.  Renal insufficiency: persistent; stop Metformin; decrease Januvia to 1/2 tablet daily.  Close follow-up; avoid nephrotoxic medications.  Meds ordered this encounter  Medications  . nystatin (MYCOSTATIN)  100000 UNIT/ML suspension    Sig: Take 5 mLs (500,000 Units total) by mouth 4 (four) times daily.    Dispense:  200 mL    Refill:  0  . Insulin Glargine (LANTUS) 100 UNIT/ML Solostar Pen    Sig: Inject 10 Units into the skin daily at 10 pm.    Dispense:  15 mL    Refill:  11    No Follow-up on file.  I personally performed the services described in this documentation, which was scribed in my presence. The recorded information has  been reviewed and considered.  Ramesses Crampton Paulita Fujita, M.D. [facility] [address] [place], [place]  [address] [phone] phone [phone] fax

## 2015-04-27 NOTE — Progress Notes (Addendum)
[name], [name] ([id number]) Visit Report for 04/27/2015 Arrival Information Details Patient Name: [name], [name] Date of Service: 04/27/2015 8:45 AM Medical Record Number: [id number] Patient Account Number: [id number] Date of Birth/Sex: 1956-03-16 (59 y.o. Female) Treating RN: Afful, RN, BSN, Velva Harman Primary Care Physician: [name] Other Clinician: Referring Physician: [name] Treating Physician/Extender: Frann Rider in Treatment: 15 Visit Information History Since Last Visit Any new allergies or adverse reactions: No Patient Arrived: Wheel Chair Had a fall or experienced change in No Arrival Time: 08:58 activities of daily living that may affect Accompanied By: husband risk of falls: Transfer Assistance: None Signs or symptoms of abuse/neglect since last No Patient Identification Verified: Yes visito Secondary Verification Process Yes Hospitalized since last visit: No Completed: Has Dressing in Place as Prescribed: Yes Patient Requires Transmission- No Pain Present Now: No Based Precautions: Patient Has Alerts: Yes Patient Alerts: Patient on Blood Thinner ABI L:0.8, R:0.93 Electronic Signature(s) Signed: 04/27/2015 8:59:07 AM By: Regan Lemming BSN, RN Entered By: Regan Lemming on 04/27/2015 08:59:07 [name] ([id number]) -------------------------------------------------------------------------------- Lower Extremity Assessment Details Patient Name: [name] Date of Service: 04/27/2015 8:45 AM Medical Record Number: [id number] Patient Account Number: [id number] Date of Birth/Sex: 03-09-1956 (59 y.o. Female) Treating RN: Afful, RN, BSN, Velva Harman Primary Care Physician: [name] Other Clinician: Referring Physician: [name] Treating Physician/Extender: Frann Rider in Treatment: 15 Edema Assessment Assessed: [Left: No] [Right: No] Edema: [Left: N] [Right: o] Vascular Assessment Pulses: Posterior Tibial Dorsalis  Pedis Palpable: [Right:Yes] Extremity colors, hair growth, and conditions: Extremity Color: [Right:Normal] Hair Growth on Extremity: [Right:No] Temperature of Extremity: [Right:Warm] Capillary Refill: [Right:< 3 seconds] Electronic Signature(s) Signed: 04/27/2015 9:06:13 AM By: Regan Lemming BSN, RN Entered By: Regan Lemming on 04/27/2015 09:06:12 [name] ([id number]) -------------------------------------------------------------------------------- Multi Wound Chart Details Patient Name: [name] Date of Service: 04/27/2015 8:45 AM Medical Record Number: [id number] Patient Account Number: [id number] Date of Birth/Sex: 1956-03-27 (59 y.o. 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] [4:Pressure Ulcer] [7:Diabetic Wound/Ulcer of the Lower Extremity] Date Acquired: [3:11/07/2014] [4:02/21/2015] [7:03/10/2015] Weeks of Treatment: [3:14] [4:7] [7:6] Wound Status: [3:Open] [4:Open] [7:Open] Measurements L x W x D 0.7x0.5x0.1 [4:1x0.5x0.1] [7:0.5x0.8x0.4] (cm) Area (cm) : [3:0.275] [4:0.393] [7:0.314] Volume (cm) : [3:0.027] [4:0.039] [7:0.126] % Reduction in Area: [3:50.00%] [4:92.70%] [7:61.60%] % Reduction in Volume: 75.50% [4:92.80%] [7:-53.70%] Classification: [3:Category/Stage III] [4:Category/Stage III] [7:Grade 1] Periwound Skin Texture: No Abnormalities Noted [4:No Abnormalities Noted] [7:No Abnormalities  Noted] Periwound Skin [3:No Abnormalities Noted] [4:No Abnormalities Noted] [7:No Abnormalities Noted] Moisture: Periwound Skin Color: No Abnormalities Noted [4:No Abnormalities Noted] [7:No Abnormalities Noted] Tenderness on [3:No] [4:No] [7:No] Treatment Notes Electronic Signature(s) Signed: 04/27/2015 9:13:54 AM By: Regan Lemming BSN, RN Entered By: Regan Lemming on 04/27/2015 09:13:53 [name] ([id number] [name] ([id number]) -------------------------------------------------------------------------------- [place] Details Patient Name: [name] Date of Service: 04/27/2015 8:45 AM Medical Record Number: [id number] Patient Account Number: [id number] Date of Birth/Sex: 1955/11/06 (59 y.o. Female) Treating RN: Baruch Gouty, RN, BSN, Velva Harman Primary Care Physician: [name] Other Clinician: Referring Physician: [name] Treating Physician/Extender: Frann Rider in  Treatment: 15 Active Inactive Abuse / Safety / Falls / Self Care Management Nursing Diagnoses: Impaired physical mobility Potential for falls Self care deficit: actual or potential Goals: Patient/caregiver will verbalize understanding of skin care regimen Date Initiated: 01/10/2015 Goal Status: Active Patient/caregiver will verbalize/demonstrate measures taken to improve the patient's personal safety Date Initiated: 01/10/2015 Goal Status: Active Patient/caregiver will verbalize/demonstrate measures taken to prevent injury and/or falls Date Initiated: 01/10/2015 Goal Status: Active Patient/caregiver will verbalize/demonstrate understanding of what to do in case of emergency Date Initiated: 01/10/2015 Goal Status: Active Interventions: Assess fall risk on admission and as needed Assess impairment of mobility on admission and as needed per policy Provide education on basic hygiene Provide education on fall prevention Provide education on personal and home safety Provide  education on safe transfers Treatment Activities: Education provided on Basic Hygiene : 01/10/2015 Notes: Nutrition [name], [name] ([id number]) Nursing Diagnoses: Impaired glucose control: actual or potential Potential for alteratiion in Nutrition/Potential for imbalanced nutrition Goals: Patient/caregiver agrees to and verbalizes understanding of need to use nutritional supplements and/or vitamins as prescribed Date Initiated: 01/10/2015 Goal Status: Active Interventions: Assess HgA1c results as ordered upon admission and as needed Treatment Activities: Patient referred to Primary Care Physician for further nutritional evaluation : 04/27/2015 Notes: Orientation to the Wound Care Program Nursing Diagnoses: Knowledge deficit related to the wound healing center program Goals: Patient/caregiver will verbalize understanding of the [place] Date Initiated: 01/10/2015 Goal Status: Active Interventions: Provide education on orientation to the wound center Notes: Osteomyelitis Nursing Diagnoses: Potential for infection: osteomyelitis Goals: Diagnostic evaluation for osteomyelitis completed as ordered Date Initiated: 01/17/2015 Goal Status: Active Patient/caregiver will verbalize understanding of disease process and disease management Date Initiated: 01/17/2015 Goal Status: Active Signs and symptoms for osteomyelitis will be recognized and promptly addressed [name], [name] ([id number]) Date Initiated: 01/17/2015 Goal Status: Active Interventions: Assess for signs and symptoms of osteomyelitis resolution every visit Provide education on osteomyelitis Treatment Activities: MRI : 04/27/2015 Test ordered outside of clinic : 04/27/2015 X-ray : 04/27/2015 Notes: Soft Tissue Infection Nursing Diagnoses: Knowledge deficit related to disease process and management Potential for infection: soft tissue Goals: Patient/caregiver will verbalize understanding of or  measures to prevent infection and contamination in the home setting Date Initiated: 01/17/2015 Goal Status: Active Patient's soft tissue infection will resolve Date Initiated: 01/17/2015 Goal Status: Active Signs and symptoms of infection will be recognized early to allow for prompt treatment Date Initiated: 01/17/2015 Goal Status: Active Interventions: Assess signs and symptoms of infection every visit Provide education on infection Screen for HBO Treatment Activities: Culture : 04/27/2015 Culture and sensitivity : 04/27/2015 Education provided on Infection : 04/20/2015 Systemic antibiotics : 04/27/2015 Test ordered outside of clinic : 04/27/2015 Notes: [name], [name] ([id number]) Wound/Skin Impairment Nursing Diagnoses: Impaired tissue integrity Knowledge deficit related to ulceration/compromised skin integrity Goals: Patient/caregiver will verbalize understanding of skin care regimen Date Initiated: 01/10/2015 Goal Status: Active Ulcer/skin breakdown will have a volume reduction of 30% by week 4 Date Initiated: 01/10/2015 Goal Status: Active Ulcer/skin breakdown will have a volume reduction of 50% by week 8 Date Initiated: 01/10/2015 Goal Status: Active Ulcer/skin breakdown will have a volume reduction of 80% by week 12 Date Initiated: 01/10/2015 Goal Status: Active Ulcer/skin breakdown will heal within 14 weeks Date Initiated: 01/10/2015 Goal Status: Active Interventions: Assess patient/caregiver ability to obtain necessary supplies Assess patient/caregiver ability to perform ulcer/skin care regimen upon admission and as needed Assess ulceration(s) every visit Provide education on smoking Provide education on ulcer  and skin care Treatment Activities: Consult for HBO : 04/27/2015 Skin care regimen initiated : 04/27/2015 Topical wound management initiated : 04/27/2015 Notes: Electronic Signature(s) Signed: 04/27/2015 9:13:45 AM By: Regan Lemming BSN, RN Entered By: Regan Lemming on  04/27/2015 09:13:44 [name] ([id number]) -------------------------------------------------------------------------------- Pain Assessment Details Patient Name: [name] Date of Service: 04/27/2015 8:45 AM Medical Record Number: [id number] Patient Account Number: [id number] Date of Birth/Sex: 1956-01-28 (59 y.o. Female) Treating RN: Baruch Gouty, RN, BSN, Velva Harman Primary Care Physician: [name] Other Clinician: Referring Physician: [name] Treating Physician/Extender: Frann Rider in Treatment: 15 Active Problems Location of Pain Severity and Description of Pain Patient Has Paino No Site Locations Pain Management and Medication Current Pain Management: Electronic Signature(s) Signed: 04/27/2015 8:59:13 AM By: Regan Lemming BSN, RN Entered By: Regan Lemming on 04/27/2015 08:59:13 [name] ([id number]) -------------------------------------------------------------------------------- Wound Assessment Details Patient Name: [name] Date of Service: 04/27/2015 8:45 AM Medical Record Number: [id number] Patient Account Number: [id number] Date of Birth/Sex: [birth year] (59 y.o. Female) Treating RN: Afful, RN, BSN, [place] Primary Care Physician: [name] Other Clinician: Referring Physician: [name] Treating Physician/Extender: Frann Rider in Treatment: 15 Wound Status Wound Number: 3 Primary Etiology: Pressure Ulcer Wound Location: Nose Wound Status: Open Wounding Event: Pressure Injury Date Acquired: 11/07/2014 Weeks Of Treatment: 14 Clustered Wound: No Photos Photo Uploaded By: Regan Lemming on 04/27/2015 12:04:33 Wound Measurements Length: (cm) 0.7 Width: (cm) 0.5 Depth: (cm) 0.1 Area: (cm) 0.275 Volume: (cm) 0.027 % Reduction in Area: 50% % Reduction in Volume: 75.5% Wound Description Classification: Category/Stage III Periwound Skin Texture Texture Color No Abnormalities Noted: No No Abnormalities Noted:  No Moisture No Abnormalities Noted: No Electronic Signature(s) Signed: 04/28/2015 2:16:09 PM By: Regan Lemming BSN, RN [name] ([id number]) Entered By: Regan Lemming on 04/27/2015 09:07:27 [name] ([id number]) -------------------------------------------------------------------------------- Wound Assessment Details Patient Name: [name] Date of Service: 04/27/2015 8:45 AM Medical Record Number: [id number] Patient Account Number: [id number] Date of Birth/Sex: 1956/02/20 (59 y.o. Female) Treating RN: Afful, RN, BSN, [place] Primary Care Physician: [name] Other Clinician: Referring Physician: [name] Treating Physician/Extender: Frann Rider in Treatment: 15 Wound Status Wound Number: 4 Primary Etiology: Pressure Ulcer Wound Location: Medial Coccyx Wound Status: Open Wounding Event: Pressure Injury Date Acquired: 02/21/2015 Weeks Of Treatment: 7 Clustered Wound: No Photos Photo Uploaded By: Regan Lemming on 04/27/2015 12:04:34 Wound Measurements Length: (cm) 1 Width: (cm) 0.5 Depth: (cm) 0.1 Area: (cm) 0.393 Volume: (cm) 0.039 % Reduction in Area: 92.7% % Reduction in Volume: 92.8% Wound Description Classification: Category/Stage III Periwound Skin Texture Texture Color No Abnormalities Noted: No No Abnormalities Noted: No Moisture No Abnormalities Noted: No Electronic Signature(s) Signed: 04/28/2015 2:16:09 PM By: Regan Lemming BSN, RN [name] ([id number]) Entered By: Regan Lemming on 04/27/2015 09:07:27 [name] ([id number]) -------------------------------------------------------------------------------- Wound Assessment Details Patient Name: [name] Date of Service: 04/27/2015 8:45 AM Medical Record Number: [id number] Patient Account Number: [id number] Date of Birth/Sex: 08/02/1956 (59 y.o. Female) Treating RN: Baruch Gouty, RN, BSN, Velva Harman Primary Care Physician: [name] Other  Clinician: Referring Physician: [name] Treating Physician/Extender: Frann Rider in Treatment: 15 Wound Status Wound Number: 7 Primary Diabetic Wound/Ulcer of the Lower Etiology: Extremity Wound Location: Right Toe Second Wound Status: Open Wounding Event: Gradually Appeared Date Acquired: 03/10/2015 Weeks Of Treatment: 6 Clustered Wound: No Photos Photo Uploaded By: Regan Lemming on 04/27/2015 12:05:37 Wound Measurements Length: (cm) 0.5 Width: (cm) 0.8 Depth: (cm) 0.4 Area: (cm) 0.314  Volume: (cm) 0.126 % Reduction in Area: 61.6% % Reduction in Volume: -53.7% Wound Description Classification: Grade 1 Periwound Skin Texture Texture Color No Abnormalities Noted: No No Abnormalities Noted: No Moisture No Abnormalities Noted: No Electronic Signature(s) Signed: 04/28/2015 2:16:09 PM By: Regan Lemming BSN, RN [name] ([id number]) Entered By: Regan Lemming on 04/27/2015 09:07:28 [name] ([id number]) -------------------------------------------------------------------------------- [place] Details Patient Name: [name] Date of Service: 04/27/2015 8:45 AM Medical Record Number: [id number] Patient Account Number: [id number] Date of Birth/Sex: 01-18-1956 (59 y.o. Female) Treating RN: Afful, RN, BSN, [place] Primary Care Physician: [name] Other Clinician: Referring Physician: [name] Treating Physician/Extender: Frann Rider in Treatment: 15 Vital Signs Time Taken: 08:59 Temperature (F): 98.1 Height (in): 68 Pulse (bpm): 72 Weight (lbs): 257 Respiratory Rate (breaths/min): 17 Body Mass Index (BMI): 39.1 Blood Pressure (mmHg): 84/47 Reference Range: 80 - 120 mg / dl Electronic Signature(s) Signed: 04/27/2015 9:00:44 AM By: Regan Lemming BSN, RN Entered By: Regan Lemming on 04/27/2015 09:00:44

## 2015-04-27 NOTE — Progress Notes (Signed)
ELZIE, WAHLEN ([id number]) Visit Report for 04/27/2015 HBO Details Patient Name: [name], [name] Date of Service: 04/27/2015 10:00 AM Medical Record Number: [id number] Patient Account Number: [id number] Date of Birth/Sex: 02/20/1956 (59 y.o. 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 HBO Treatment Details Treatment Number: 12 Patient Type: Outpatient Chamber Type: Monoplace Chamber #: HBO [id number] Treatment Protocol: 2.0 ATA with 90 minutes oxygen, and no air breaks Treatment Details Compression Rate Down: 1.5 psi / minute De-Compression Rate Up: 1.5 psi / minute Air breaks and breathing Compress Tx Pressure Decompress Decompress periods Begins Reached Begins Ends (leave unused spaces blank) Chamber Pressure 1 ATA 2.0 ATA - - - - - - 2.0 ATA 1 ATA Clock Time (24 hr) 09:45 09:55 - - - - - - 11:25 11:35 Treatment Length: 110 (minutes) Treatment Segments: 4 Capillary Blood Glucose Pre Capillary Blood Glucose (mg/dl): Post Capillary Blood Glucose (mg/dl): Vital Signs Capillary Blood Glucose Reference Range: 80 - 120 mg / dl HBO Diabetic Blood Glucose Intervention Range: <131 mg/dl or >249 mg/dl Time Vitals Blood Respiratory Capillary Blood Glucose Pulse Action Type: Pulse: Temperature: Taken: Pressure: Rate: Glucose (mg/dl): Meter #: Oximetry (%) Taken: Pre 09:28 84/47 72 17 98.1 290 1 none Post 11:39 94/52 66 18 97.6 276 1 none Treatment Response Well KOREEN, MEZEY ([id number]) Treatment Toleration: Treatment Treatment Completed without Adverse Event Completion Status: Electronic  Signature(s) Signed: 04/27/2015 3:13:11 PM By: Paulla Fore, RRT, CHT Signed: 04/27/2015 4:15:37 PM By: Christin Fudge MD, FACS Previous Signature: 04/27/2015 11:59:15 AM Version By: Christin Fudge MD, FACS Entered By: Lorine Bears on 04/27/2015 12:08:17 [name] ([id number]) -------------------------------------------------------------------------------- HBO Safety Checklist Details Patient Name: [name] Date of Service: 04/27/2015 10:00 AM Medical Record Number: [id number] Patient Account Number: [id number] Date of Birth/Sex: 1956/02/24 (59 y.o. Female) Treating RN: Primary Care Physician: [name] Other Clinician: Jacqulyn Bath Referring Physician: [name] Treating Physician/Extender: Frann Rider in Treatment: 15 HBO Safety Checklist Items Safety Checklist Consent Form Signed Patient voided / foley secured and emptied When did you last eato 20:00 pm on 04/26/15 Last dose of injectable or oral agent 08:00 am Ostomy pouch emptied and vented if applicable n/a All implantable devices assessed, documented and approved ICD Intravenous access site secured and place n/a Valuables secured Linens and cotton and cotton/polyester blend (less than 51% polyester) Personal oil-based products / skin lotions / body lotions removed Wigs or hairpieces removed Smoking or tobacco materials removed Books / newspapers / magazines / loose paper removed Cologne, aftershave, perfume and deodorant removed Jewelry removed (may wrap wedding band) Make-up removed Hair care products removed Battery operated devices (external) removed Heating patches and chemical warmers removed Titanium eyewear removed n/a Nail polish cured greater than 10 hours n/a Casting material cured greater than 10 hours n/a Hearing aids removed n/a Loose dentures or partials removed n/a Prosthetics have been removed n/a Patient demonstrates correct use of  air break device (if applicable) Patient concerns have been addressed Patient grounding bracelet on and cord attached to chamber Specifics for Inpatients (complete in addition to above) Medication sheet sent with patient Intravenous medications needed or due during therapy sent with patient MAKIAYA, OFLAHERTY ([id number]) Drainage tubes (e.g. nasogastric tube  or chest tube secured and vented) Endotracheal or Tracheotomy tube secured Cuff deflated of air and inflated with saline Airway suctioned Electronic Signature(s) Signed: 04/27/2015 3:13:11 PM By: Lorine Bears RCP, RRT, CHT Entered By: Lorine Bears on 04/27/2015 09:35:53

## 2015-04-27 NOTE — Patient Instructions (Signed)
1. Decrease Carvedilol 25mg    1/2 tablet twice daily. 2.  Continue Januvia 100mg   1/2 tablet daily. 3.  Stop Metformin when you complete current prescription. 4.  Start Lantus 10 units at bedtime.

## 2015-04-28 ENCOUNTER — Encounter: Payer: PPO | Admitting: Surgery

## 2015-04-28 ENCOUNTER — Other Ambulatory Visit (HOSPITAL_COMMUNITY)
Admission: RE | Admit: 2015-04-28 | Discharge: 2015-04-28 | Disposition: A | Discharge status: Home / Self Care | Payer: PPO | Source: Ambulatory Visit | Attending: Neurology | Admitting: Neurology

## 2015-04-28 ENCOUNTER — Other Ambulatory Visit: Payer: Self-pay | Admitting: Neurology

## 2015-04-28 ENCOUNTER — Ambulatory Visit
Admission: RE | Admit: 2015-04-28 | Discharge: 2015-04-28 | Disposition: A | Discharge status: Home / Self Care | Payer: PPO | Source: Ambulatory Visit | Attending: Neurology | Admitting: Neurology

## 2015-04-28 DIAGNOSIS — G610 Guillain-Barre syndrome: Secondary | ICD-10-CM

## 2015-04-28 DIAGNOSIS — L97422 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 layer exposed: Secondary | ICD-10-CM | POA: Diagnosis not present

## 2015-04-28 DIAGNOSIS — R269 Unspecified abnormalities of gait and mobility: Secondary | ICD-10-CM

## 2015-04-28 DIAGNOSIS — G8332 Monoplegia, unspecified affecting left dominant side: Secondary | ICD-10-CM

## 2015-04-28 DIAGNOSIS — G8220 Paraplegia, unspecified: Secondary | ICD-10-CM

## 2015-04-28 DIAGNOSIS — E0842 Diabetes mellitus due to underlying condition with diabetic polyneuropathy: Secondary | ICD-10-CM

## 2015-04-28 LAB — COMPREHENSIVE METABOLIC PANEL
ALBUMIN: 3.4 g/dL — AB (ref 3.5–5.2)
AST: 9 U/L (ref 0–37)
Alkaline Phosphatase: 86 U/L (ref 39–117)
BUN: 48 mg/dL — ABNORMAL HIGH (ref 6–23)
CALCIUM: 8.8 mg/dL (ref 8.4–10.5)
CHLORIDE: 90 meq/L — AB (ref 96–112)
CO2: 30 meq/L (ref 19–32)
Creat: 3.69 mg/dL — ABNORMAL HIGH (ref 0.50–1.10)
Glucose, Bld: 416 mg/dL — ABNORMAL HIGH (ref 70–99)
Potassium: 4.6 mEq/L (ref 3.5–5.3)
SODIUM: 132 meq/L — AB (ref 135–145)
TOTAL PROTEIN: 7.1 g/dL (ref 6.0–8.3)
Total Bilirubin: 0.3 mg/dL (ref 0.2–1.2)

## 2015-04-28 LAB — GLUCOSE, CAPILLARY
GLUCOSE-CAPILLARY: 58 mg/dL — AB (ref 65–99)
GLUCOSE-CAPILLARY: 75 mg/dL (ref 65–99)
Glucose-Capillary: 88 mg/dL (ref 65–99)

## 2015-04-28 NOTE — Progress Notes (Signed)
Vickie, [name] ([id number]) Visit Report for 04/28/2015 Arrival Information Details Patient Name: [name], [name] Date of Service: 04/28/2015 10:00 AM Medical Record Number: [id number] Patient Account Number: [id number] Date of Birth/Sex: Feb 16, 1956 (59 y.o. Female) Treating RN: Primary Care Physician: Nilda Simmer Other Clinician: Izetta Dakin Referring Physician: Nilda Simmer Treating Physician/Extender: Rudene Re in Treatment: 15 Visit Information History Since Last Visit Added or deleted any medications: No Patient Arrived: Wheel Chair Any new allergies or adverse reactions: No Arrival Time: 08:10 Had a fall or experienced change in No Accompanied By: husband activities of daily living that may affect Transfer Assistance: Manual risk of falls: Patient Identification Verified: Yes Signs or symptoms of abuse/neglect No Secondary Verification Process Yes since last visito Completed: Hospitalized since last visit: No Patient Requires Transmission- No Has Dressing in Place as Prescribed: Yes Based Precautions: Has Footwear/Offloading in Place as Yes Patient Has Alerts: Yes Prescribed: Patient Alerts: Patient on Blood Left: Surgical Shoe with Thinner Pressure Relief ABI L:0.8, Insole R:0.93 Right: Surgical Shoe with Pressure Relief Insole Pain Present Now: No Electronic Signature(s) Signed: 04/28/2015 10:25:29 AM By: Dayton Martes RCP, RRT, CHT Entered By: Dayton Martes on 04/28/2015 08:24:34 [name] ([id number]) -------------------------------------------------------------------------------- Clinic Level of Care Assessment Details Patient Name: [name] Date of Service: 04/28/2015 10:00 AM Medical Record Number: [id number] Patient Account Number: [id number] Date of Birth/Sex: 03/04/1956 (59 y.o. Female) Treating RN: Primary Care Physician: Nilda Simmer Other Clinician: Izetta Dakin Referring  Physician: Nilda Simmer Treating Physician/Extender: Rudene Re in Treatment: 15 Clinic Level of Care Assessment Items TOOL 4 Quantity Score []  - Use when only an EandM is performed on FOLLOW-UP visit 0 ASSESSMENTS - Nursing Assessment / Reassessment []  - Reassessment of Co-morbidities (includes updates in patient status) 0 []  - Reassessment of Adherence to Treatment Plan 0 ASSESSMENTS - Wound and Skin Assessment / Reassessment []  - Simple Wound Assessment / Reassessment - one wound 0 []  - Complex Wound Assessment / Reassessment - multiple wounds 0 []  - Dermatologic / Skin Assessment (not related to wound area) 0 ASSESSMENTS - Focused Assessment []  - Circumferential Edema Measurements - multi extremities 0 []  - Nutritional Assessment / Counseling / Intervention 0 []  - Lower Extremity Assessment (monofilament, tuning fork, pulses) 0 []  - Peripheral Arterial Disease Assessment (using hand held doppler) 0 ASSESSMENTS - Ostomy and/or Continence Assessment and Care []  - Incontinence Assessment and Management 0 []  - Ostomy Care Assessment and Management (repouching, etc.) 0 PROCESS - Coordination of Care []  - Simple Patient / Family Education for ongoing care 0 []  - Complex (extensive) Patient / Family Education for ongoing care 0 []  - Staff obtains Chiropractor, Records, Test Results / Process Orders 0 []  - Staff telephones HHA, Nursing Homes / Clarify orders / etc 0 []  - Routine Transfer to another Facility (non-emergent condition) 0 [name], [name] ([id number]) []  - Routine Hospital Admission (non-emergent condition) 0 []  - New Admissions / Manufacturing engineer / Ordering NPWT, Apligraf, etc. 0 []  - Emergency Hospital Admission (emergent condition) 0 []  - Simple Discharge Coordination 0 []  - Complex (extensive) Discharge Coordination 0 PROCESS - Special Needs []  - Pediatric / Minor Patient Management 0 []  - Isolation Patient Management 0 []  - Hearing / Language / Visual  special needs 0 []  - Assessment of Community assistance (transportation, D/C planning, etc.) 0 []  - Additional assistance / Altered mentation 0 []  - Support Surface(s) Assessment (bed, cushion, seat, etc.) 0 INTERVENTIONS - Wound Cleansing / Measurement []  -  Simple Wound Cleansing - one wound 0 []  - Complex Wound Cleansing - multiple wounds 0 []  - Wound Imaging (photographs - any number of wounds) 0 []  - Wound Tracing (instead of photographs) 0 []  - Simple Wound Measurement - one wound 0 []  - Complex Wound Measurement - multiple wounds 0 INTERVENTIONS - Wound Dressings []  - Small Wound Dressing one or multiple wounds 0 []  - Medium Wound Dressing one or multiple wounds 0 []  - Large Wound Dressing one or multiple wounds 0 []  - Application of Medications - topical 0 []  - Application of Medications - injection 0 INTERVENTIONS - Miscellaneous []  - External ear exam 0 [name], [name] ([id number]) []  - Specimen Collection (cultures, biopsies, blood, body fluids, etc.) 0 []  - Specimen(s) / Culture(s) sent or taken to Lab for analysis 0 []  - Patient Transfer (multiple staff / Michiel Sites Lift / Similar devices) 0 []  - Simple Staple / Suture removal (25 or less) 0 []  - Complex Staple / Suture removal (26 or more) 0 X - Hypo / Hyperglycemic Management (close monitor of Blood Glucose) 1 10 []  - Ankle / Brachial Index (ABI) - do not check if billed separately 0 X - Vital Signs 1 5 Has the patient been seen at the hospital within the last three years: Yes Total Score: 15 Level Of Care: New/Established - Level 1 Electronic Signature(s) Signed: 04/28/2015 10:25:29 AM By: Dayton Martes RCP, RRT, CHT Entered By: Dayton Martes on 04/28/2015 10:20:22 [name] ([id number]) -------------------------------------------------------------------------------- Encounter Discharge Information Details Patient Name: [name] Date of Service: 04/28/2015 10:00  AM Medical Record Number: [id number] Patient Account Number: [id number] Date of Birth/Sex: November 26, 1955 (59 y.o. Female) Treating RN: Primary Care Physician: Nilda Simmer Other Clinician: Izetta Dakin Referring Physician: Nilda Simmer Treating Physician/Extender: Rudene Re in Treatment: 15 Encounter Discharge Information Items Discharge Pain Level: 0 Discharge Condition: Stable Ambulatory Status: Wheelchair Discharge Destination: Home Private Transportation: Auto Accompanied By: husband Schedule Follow-up Appointment: No Medication Reconciliation completed and No provided to Patient/Care Kamry Faraci: Clinical Summary of Care: Notes Patient has an HBO treatment scheduled on 05/01/15 at 10:00 am. Electronic Signature(s) Signed: 04/28/2015 10:25:29 AM By: Dayton Martes RCP, RRT, CHT Entered By: Dayton Martes on 04/28/2015 10:25:05 [name] ([id number]) -------------------------------------------------------------------------------- Vitals Details Patient Name: [name] Date of Service: 04/28/2015 10:00 AM Medical Record Number: [id number] Patient Account Number: [id number] Date of Birth/Sex: 1956-06-04 (59 y.o. Female) Treating RN: Primary Care Physician: Nilda Simmer Other Clinician: Izetta Dakin Referring Physician: Nilda Simmer Treating Physician/Extender: Rudene Re in Treatment: 15 Vital Signs Time Taken: 08:13 Pulse (bpm): 72 Height (in): 68 Respiratory Rate (breaths/min): 18 Weight (lbs): 257 Blood Pressure (mmHg): 92/56 Body Mass Index (BMI): 39.1 Capillary Blood Glucose (mg/dl): 58 Reference Range: 80 - 120 mg / dl Electronic Signature(s) Signed: 04/28/2015 10:25:29 AM By: Dayton Martes RCP, RRT, CHT Entered By: Dayton Martes on 04/28/2015 08:25:05

## 2015-04-28 NOTE — Progress Notes (Signed)
One SST tube of blood drawn from left [facility] space for LP labs; site unremarkable.  Confirmed with Nickie at [facility] that patient is not on any prescription blood thinners at this time.  jkl

## 2015-04-28 NOTE — Progress Notes (Signed)
TASANEE, KENASTON ([id number]) Visit Report for 04/28/2015 HBO Details Patient Name: [name], [name] Date of Service: 04/28/2015 10:00 AM Medical Record Number: [id number] Patient Account Number: [id number] Date of Birth/Sex: 07/10/1956 (59 y.o. 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 HBO Treatment Details Chamber Type: Monoplace Patient Type: Outpatient Chamber #: HBO [id number] Treatment Protocol: 2.0 ATA with 90 minutes oxygen, and no air breaks Treatment Details Air breaks and breathing Compress Tx Pressure Decompress Decompress periods Begins Reached Begins Ends (leave unused spaces blank) Chamber Pressure 1 ATA 2.0 ATA - - - - - - 2.0 ATA 1 ATA Clock Time (24 hr) - - - - - - - - - - Treatment Length: (minutes) Treatment Segments: Capillary Blood Glucose Pre Capillary Blood Glucose (mg/dl): Post Capillary Blood Glucose (mg/dl): Vital Signs Capillary Blood Glucose Reference Range: 80 - 120 mg / dl HBO Diabetic Blood Glucose Intervention Range: <131 mg/dl or >249 mg/dl Capillary Time Pulse Blood Respiratory Blood Glucose Action Type: Vitals Pulse: Temperature: Oximetry Pressure: Rate: Glucose Meter #: Taken: Taken: (%) (mg/dl): Pre 08:13 92/56 72 18 93.6 58 1 patient given 8 oz. Ensure MD notified; pt. will eat breakfast Pre 08:57 75 1 sndwch per MD Pre 09:59 88 1 MD notifed; cancelled dive today DEJANA, SEAS. ([id number]) Treatment Notes Patient's initial blood glucose reading was 58 mg/dL. She was give 8 oz. Ensure and retested after 30 minutes per protocol. The second reading  was 75 mg/dL. The patient then ate breakfast and was tested once more after 40 minutes. This final reading was 88 mg/dL. Dr. Con Memos cancelled her treatment for today. Electronic Signature(s) Signed: 04/28/2015 10:25:29 AM By: Paulla Fore, RRT, CHT Signed: 04/28/2015 12:46:51 PM By: Christin Fudge MD, FACS Entered By: Lorine Bears on 04/28/2015 10:24:19 [name] ([id number]) -------------------------------------------------------------------------------- HBO Safety Checklist Details Patient Name: [name] Date of Service: 04/28/2015 10:00 AM Medical Record Number: [id number] Patient Account Number: [id number] Date of Birth/Sex: 1955-12-16 (59 y.o. Female) Treating RN: Primary Care Physician: [name] Other Clinician: Jacqulyn Bath Referring Physician: [name] Treating Physician/Extender: Frann Rider in Treatment: 15 HBO Safety Checklist Items Safety Checklist Consent Form Signed Patient voided / foley secured and emptied When did you last eato 15:00 pm on 04/27/15 Last dose of injectable or oral agent 07:30 am Ostomy pouch emptied and vented if applicable n/a All implantable devices assessed, documented and approved ICD Intravenous access site secured and place n/a Valuables secured Linens and cotton and cotton/polyester blend (less than 51% polyester) Personal oil-based products / skin lotions / body lotions removed Wigs or hairpieces removed Smoking or tobacco materials removed Books / newspapers / magazines / loose paper removed Cologne, aftershave, perfume and deodorant removed Jewelry removed (may wrap wedding band) Make-up removed Hair care products removed Battery operated devices (external) removed Heating patches and chemical warmers removed Titanium eyewear removed n/a Nail polish cured greater than 10 hours n/a Casting material cured greater than 10 hours n/a Hearing aids removed n/a Loose  dentures or partials removed n/a Prosthetics have been removed n/a Patient demonstrates correct use of air break device (if applicable) Patient concerns have been addressed Patient grounding bracelet on and cord attached to chamber Specifics for Inpatients (  complete in addition to above) Medication sheet sent with patient Intravenous medications needed or due during therapy sent with patient ROLLINS, STRZALKA ([id number]) Drainage tubes (e.g. nasogastric tube or chest tube secured and vented) Endotracheal or Tracheotomy tube secured Cuff deflated of air and inflated with saline Airway suctioned Electronic Signature(s) Signed: 04/28/2015 10:25:29 AM By: Lorine Bears RCP, RRT, CHT Entered By: Lorine Bears on 04/28/2015 08:26:50

## 2015-04-29 LAB — CSF CELL COUNT WITH DIFFERENTIAL
RBC Count, CSF: 0 cu mm
Tube #: 2
WBC CSF: 1 uL (ref 0–5)

## 2015-04-29 LAB — GLUCOSE, CSF: GLUCOSE CSF: 73 mg/dL (ref 43–76)

## 2015-04-29 LAB — PROTEIN, CSF: TOTAL PROTEIN, CSF: 63 mg/dL — AB (ref 15–45)

## 2015-04-30 ENCOUNTER — Telehealth: Payer: Self-pay | Admitting: Family Medicine

## 2015-04-30 LAB — CRYPTOCOCCAL AG, LTX SCR RFLX TITER: CRYPTOCOCCAL AG SCREEN: NOT DETECTED

## 2015-04-30 LAB — ANGIOTENSIN CONVERTING ENZYME, CSF: ACE, CSF: 11 U/L (ref <=15)

## 2015-04-30 NOTE — Telephone Encounter (Signed)
Call [facility] at [phone].  I signed an order for [facility] to obtain CBC on patient on 04/19/15; I have never received results of CBC; Pt also reports that [facility] obtained HgbA1c on pt.  Please have [facility] fax me ALL labs that they have obtained on patient in the past 3 months.  Also, pt reports that [facility] nurse will be going to her house Monday afternoon. I would like a CMET and u/a with micro obtained on pt for acute renal failure.  I also will want these results faxed to me.

## 2015-05-01 ENCOUNTER — Encounter: Payer: Self-pay | Admitting: *Deleted

## 2015-05-01 ENCOUNTER — Encounter: Payer: PPO | Admitting: Surgery

## 2015-05-01 DIAGNOSIS — L97422 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 layer exposed: Secondary | ICD-10-CM | POA: Diagnosis not present

## 2015-05-01 LAB — GLUCOSE, CAPILLARY
GLUCOSE-CAPILLARY: 182 mg/dL — AB (ref 65–99)
GLUCOSE-CAPILLARY: 312 mg/dL — AB (ref 65–99)
Glucose-Capillary: 200 mg/dL — ABNORMAL HIGH (ref 65–99)
Glucose-Capillary: 247 mg/dL — ABNORMAL HIGH (ref 65–99)

## 2015-05-01 LAB — CSF CULTURE W GRAM STAIN
Gram Stain: NONE SEEN
Organism ID, Bacteria: NO GROWTH

## 2015-05-01 LAB — CSF CULTURE: GRAM STAIN: NONE SEEN

## 2015-05-01 NOTE — Telephone Encounter (Signed)
Left message on voicemail advising pt to present to ED if BP remaining less than 80/50.

## 2015-05-01 NOTE — Telephone Encounter (Signed)
Left VM to have Joshua call back regarding lab results.

## 2015-05-01 NOTE — Telephone Encounter (Signed)
Dr. Tamala Julian, Crystal from [facility] called regarding this pt.  She states the pt's blood pressure has been dropping since this morning.  The pt blood pressure this morning was 98/52 and Crystal states she took it around 330 pm and it was 70/48.  The pt at this moment is feeling dehydrated and Crystal stated she told the pt to drink lots and lots of water.  I advised pt that Dr. Tamala Julian does not come into the office until 5pm and she could could come to the office to be seen today and if her b/p continued to drop to take her to the nearest ER.    Crystal would like for Dr. Tamala Julian to call her at [phone] to discuss a plan for treatment etc.

## 2015-05-02 ENCOUNTER — Encounter: Payer: PPO | Admitting: Surgery

## 2015-05-02 ENCOUNTER — Ambulatory Visit (INDEPENDENT_AMBULATORY_CARE_PROVIDER_SITE_OTHER): Payer: PPO | Admitting: Emergency Medicine

## 2015-05-02 VITALS — BP 124/60 | HR 80 | Temp 98.0°F | Resp 16 | Ht 68.0 in | Wt 227.0 lb

## 2015-05-02 DIAGNOSIS — L97422 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 layer exposed: Secondary | ICD-10-CM | POA: Diagnosis not present

## 2015-05-02 DIAGNOSIS — I5021 Acute systolic (congestive) heart failure: Secondary | ICD-10-CM

## 2015-05-02 LAB — GLUCOSE, CAPILLARY: Glucose-Capillary: 170 mg/dL — ABNORMAL HIGH (ref 65–99)

## 2015-05-02 LAB — LYME DISEASE DNA BY PCR(BORRELIA BURG): B burgdorferi DNA: NOT DETECTED

## 2015-05-02 MED ORDER — CARVEDILOL 6.25 MG PO TABS: 25.0000 mg | ORAL_TABLET | Freq: Two times a day (BID) | ORAL | Status: DC

## 2015-05-02 NOTE — Telephone Encounter (Signed)
Left message on voicemail to check status.

## 2015-05-02 NOTE — Telephone Encounter (Signed)
Spoke with Crystal/[facility].  Planned on discharging patient but now wants to recert. Provided verbal order for BMET today and repeat on Friday.  Advised to HOLD Carvedilol until further noticed. P 74.

## 2015-05-02 NOTE — Progress Notes (Signed)
Subjective:  Patient ID: [name], female    DOB: 28-Aug-1956  Age: 59 y.o. MRN: [id number]  CC: blood pressure   HPI [name] presents  with a concern about her blood pressure. She's been experiencing low blood pressure intermittently for weeks apparently and Dr. Tamala Julian advised her to cut her Coreg in half from 25-12.5 mg she is and since making that change she has experienced continued hypotension intermittently. She went to her scheduled hyperbaric oxygen treatment today and they refused to treat her because her blood pressure was 70 systolic. Her blood pressure on arriving in the office is 120/68 she's asymptomatic when her pressure drops like that. She has no chest pain or increased shortness of breath.  History [name] has a past medical history of Obesity, unspecified; Unspecified vitamin D deficiency; Pure hypercholesterolemia; Neuropathy (2011); Chicken pox; Chronic systolic CHF (congestive heart failure); Hypertension; Anemia; Automatic implantable cardioverter-defibrillator in situ; Type II diabetes mellitus; CAD (coronary artery disease); History of blood transfusion (~ 2011); OSA on CPAP; and LBBB (left bundle branch block).   She has past surgical history that includes Lumbar disc surgery (1996); Incision and drainage abscess (Right, 2007); Bilateral oophorectomy (01/2011); Bi-ventricular implantable cardioverter defibrillator  (crt-d) (07/06/2014); Laparoscopic cholecystectomy (2011); Back surgery; Coronary angioplasty with stent (Left, 07/2012); Vaginal hysterectomy (01/2011); Tubal ligation (1981); left heart catheterization with coronary angiogram (N/A, 07/21/2012); percutaneous coronary stent intervention (pci-s) (N/A, 07/23/2012); bi-ventricular implantable cardioverter defibrillator (N/A, 07/06/2014); Colonoscopy with propofol (Left, 02/22/2015); Esophagogastroduodenoscopy (N/A, 02/22/2015); and Cardiac catheterization (N/A, 02/10/2015).   Her  family history includes  Arthritis in her mother; Cancer in her father; Diabetes in her maternal grandmother, mother, and paternal grandmother; Hypertension in her mother; Obesity in her brother.  She   reports that she has never smoked. She has never used smokeless tobacco. She reports that she does not drink alcohol or use illicit drugs.  Outpatient Prescriptions Prior to Visit  Medication Sig Dispense Refill  . aspirin EC 81 MG tablet Take 1 tablet (81 mg total) by mouth daily. 90 tablet 3  . ciprofloxacin (CIPRO) 500 MG tablet Take 500 mg by mouth 2 (two) times daily.    Marland Kitchen gabapentin (NEURONTIN) 600 MG tablet Take 1 tablet (600 mg total) by mouth 3 (three) times daily. 270 tablet 3  . Insulin Glargine (LANTUS) 100 UNIT/ML Solostar Pen Inject 10 Units into the skin daily at 10 pm. 15 mL 11  . isosorbide mononitrate (IMDUR) 60 MG 24 hr tablet Take 1 tablet (60 mg total) by mouth daily. 90 tablet 3  . nitroGLYCERIN (NITROSTAT) 0.4 MG SL tablet Place 1 tablet (0.4 mg total) under the tongue every 5 (five) minutes as needed for chest pain. 30 tablet 0  . nortriptyline (PAMELOR) 50 MG capsule Take 1 capsule (50 mg total) by mouth at bedtime. 30 capsule 0  . nystatin (MYCOSTATIN) 100000 UNIT/ML suspension Take 5 mLs (500,000 Units total) by mouth 4 (four) times daily. 200 mL 0  . pravastatin (PRAVACHOL) 40 MG tablet Take 1 tablet (40 mg total) by mouth at bedtime. 90 tablet 3  . torsemide (DEMADEX) 20 MG tablet Take 2 tablets (40 mg total) by mouth daily. 60 tablet 3  . carvedilol (COREG) 25 MG tablet Take 1 tablet (25 mg total) by mouth 2 (two) times daily with a meal. 180 tablet 3  . glimepiride (AMARYL) 4 MG tablet Take 1 tablet (4 mg total) by mouth 2 (two) times daily before a meal. (Patient not taking: Reported on  05/02/2015) 180 tablet 3  . metFORMIN (GLUCOPHAGE) 1000 MG tablet TAKE 1 TABLET BY MOUTH ONCE A DAY (Patient not taking: Reported on 05/02/2015) 90 tablet 0  . sitaGLIPtin (JANUVIA) 100 MG tablet Take 1  tablet (100 mg total) by mouth daily. (Patient not taking: Reported on 05/02/2015) 90 tablet 0   No facility-administered medications prior to visit.    History   Social History  . Marital Status: Married    Spouse Name: N/A  . Number of Children: 2  . Years of Education: 12   Occupational History  . disabled     03/2010 for peripheral neuropathy  . home daycare     x 20 yrs.   Social History Main Topics  . Smoking status: Never Smoker   . Smokeless tobacco: Never Used  . Alcohol Use: No  . Drug Use: No  . Sexual Activity: No   Other Topics Concern  . None   Social History Narrative   Marital status:Married x 42 yrs. Happily married, no abuse.       Children:  2 children (daughter 26, son 18). One grandson and 2 step grandchildren. Daughter and grandson live in home with pt and spouse.       Lives: with husband.      Employment: disability for peripheral neuropathy 2012.  Previously had home daycare.      Tobacco: none      Alcohol: none      Drugs: none      Exercise: none.  [employer].      Pets: dog.      Always uses seat belts, smoke detectors in home.      No guns in the home.       Caffeine use: 2 cups coffee per day.       Nutrition: Well balanced diet.     Review of Systems  Constitutional: Negative for fever, chills and appetite change.  HENT: Negative for congestion, ear pain, postnasal drip, sinus pressure and sore throat.   Eyes: Negative for pain and redness.  Respiratory: Negative for cough, shortness of breath and wheezing.   Cardiovascular: Negative for leg swelling.  Gastrointestinal: Negative for nausea, vomiting, abdominal pain, diarrhea, constipation and blood in stool.  Endocrine: Negative for polyuria.  Genitourinary: Negative for dysuria, urgency, frequency and flank pain.  Musculoskeletal: Negative for gait problem.  Skin: Negative for rash.  Neurological: Positive for dizziness. Negative for weakness and headaches.    Psychiatric/Behavioral: Negative for confusion and decreased concentration. The patient is not nervous/anxious.     Objective:  BP 124/60 mmHg  Pulse 80  Temp(Src) 98 F (36.7 C) (Oral)  Resp 16  Ht 5\' 8"  (1.727 m)  Wt 227 lb (102.967 kg)  BMI 34.52 kg/m2  SpO2 98%  Physical Exam  Constitutional: She is oriented to person, place, and time. She appears well-developed and well-nourished. No distress.  HENT:  Head: Normocephalic and atraumatic.  Right Ear: External ear normal.  Left Ear: External ear normal.  Nose: Nose normal.  Eyes: Conjunctivae and EOM are normal. Pupils are equal, round, and reactive to light. No scleral icterus.  Neck: Normal range of motion. Neck supple. No tracheal deviation present.  Cardiovascular: Normal rate, regular rhythm and normal heart sounds.   Pulmonary/Chest: Effort normal. No respiratory distress. She has no wheezes. She has no rales.  Abdominal: She exhibits no mass. There is no tenderness. There is no rebound and no guarding.  Musculoskeletal: She exhibits no edema.  Lymphadenopathy:    She has no cervical adenopathy.  Neurological: She is alert and oriented to person, place, and time. Coordination normal.  Skin: Skin is warm and dry. No rash noted.  Psychiatric: She has a normal mood and affect. Her behavior is normal.      Assessment & Plan:   Kentasia was seen today for blood pressure.  Diagnoses and all orders for this visit:  Acute systolic heart failure Orders: -     carvedilol (COREG) 6.25 MG tablet; Take 4 tablets (25 mg total) by mouth 2 (two) times daily with a meal.   I have changed Ms. Fuhriman's carvedilol. I am also having her maintain her nitroGLYCERIN, aspirin EC, gabapentin, pravastatin, isosorbide mononitrate, torsemide, sitaGLIPtin, ciprofloxacin, metFORMIN, glimepiride, nortriptyline, nystatin, and Insulin Glargine.  Meds ordered this encounter  Medications  . carvedilol (COREG) 6.25 MG tablet    Sig: Take 4  tablets (25 mg total) by mouth 2 (two) times daily with a meal.    Dispense:  60 tablet    Refill:  0   I instructed her to take 6.25 mg BID and follow up with Dr Tamala Julian next week   Appropriate red flag conditions were discussed with the patient as well as actions that should be taken.  Patient expressed his understanding.  Follow-up: Return in about 1 week (around 05/09/2015), or if symptoms worsen or fail to improve.  Roselee Culver, MD

## 2015-05-02 NOTE — Progress Notes (Signed)
[name] ([id number]) Visit Report for 05/02/2015 Arrival Information Details Patient Name: [name] Date of Service: 05/02/2015 10:00 AM Medical Record Number: [id number] Patient Account Number: [id number] Date of Birth/Sex: 08-18-1956 (59 y.o. Female) Treating RN: Primary Care Physician: [name] Other Clinician: Jacqulyn Bath Referring Physician: [name] Treating Physician/Extender: Frann Rider in Treatment: 26 Visit Information History Since Last Visit Added or deleted any medications: No Patient Arrived: Wheel Chair Any new allergies or adverse reactions: No Arrival Time: 09:05 Had a fall or experienced change in No Accompanied By: husband activities of daily living that may affect Transfer Assistance: Manual risk of falls: Patient Identification Verified: Yes Hospitalized since last visit: No Secondary Verification Process Yes Pain Present Now: No Completed: Patient Requires Transmission- No Based Precautions: Patient Has Alerts: Yes Patient Alerts: Patient on Blood Thinner ABI L:0.8, R:0.93 Electronic Signature(s) Signed: 05/02/2015 9:50:09 AM By: Lorine Bears RCP, RRT, CHT Entered By: Lorine Bears on 05/02/2015 09:21:00 [name] ([id number]) -------------------------------------------------------------------------------- Clinic Level of Care Assessment Details Patient Name: [name] Date of Service: 05/02/2015 10:00 AM Medical Record Number: [id number] Patient Account Number: [id number] Date of Birth/Sex: [birth year] (59 y.o. Female) Treating RN: Primary Care Physician: [name] Other Clinician: Jacqulyn Bath Referring Physician: [name] Treating Physician/Extender: Frann Rider in Treatment: 16 Clinic Level of Care Assessment Items TOOL 4 Quantity Score []  - Use when only an EandM is performed on FOLLOW-UP visit 0 ASSESSMENTS - Nursing Assessment /  Reassessment []  - Reassessment of Co-morbidities (includes updates in patient status) 0 []  - Reassessment of Adherence to Treatment Plan 0 ASSESSMENTS - Wound and Skin Assessment / Reassessment []  - Simple Wound Assessment / Reassessment - one wound 0 []  - Complex Wound Assessment / Reassessment - multiple wounds 0 []  - Dermatologic / Skin Assessment (not related to wound area) 0 ASSESSMENTS - Focused Assessment []  - Circumferential Edema Measurements - multi extremities 0 []  - Nutritional Assessment / Counseling / Intervention 0 []  - Lower Extremity Assessment (monofilament, tuning fork, pulses) 0 []  - Peripheral Arterial Disease Assessment (using hand held doppler) 0 ASSESSMENTS - Ostomy and/or Continence Assessment and Care []  - Incontinence Assessment and Management 0 []  - Ostomy Care Assessment and Management (repouching, etc.) 0 PROCESS - Coordination of Care []  - Simple Patient / Family Education for ongoing care 0 []  - Complex (extensive) Patient / Family Education for ongoing care 0 []  - Staff obtains Programmer, systems, Records, Test Results / Process Orders 0 []  - Staff telephones HHA, Nursing Homes / Clarify orders / etc 0 []  - Routine Transfer to another Facility (non-emergent condition) 0 [name] ([id number]) []  - Routine Hospital Admission (non-emergent condition) 0 []  - New Admissions / Biomedical engineer / Ordering NPWT, Apligraf, etc. 0 []  - Emergency Hospital Admission (emergent condition) 0 []  - Simple Discharge Coordination 0 []  - Complex (extensive) Discharge Coordination 0 PROCESS - Special Needs []  - Pediatric / Minor Patient Management 0 []  - Isolation Patient Management 0 []  - Hearing / Language / Visual special needs 0 []  - Assessment of Community assistance (transportation, D/C planning, etc.) 0 []  - Additional assistance / Altered mentation 0 []  - Support Surface(s) Assessment (bed, cushion, seat, etc.) 0 INTERVENTIONS - Wound Cleansing /  Measurement []  - Simple Wound Cleansing - one wound 0 []  - Complex Wound Cleansing - multiple wounds 0 []  - Wound Imaging (photographs - any number of wounds) 0 []  - Wound Tracing (instead of photographs) 0 []  -  Simple Wound Measurement - one wound 0 []  - Complex Wound Measurement - multiple wounds 0 INTERVENTIONS - Wound Dressings []  - Small Wound Dressing one or multiple wounds 0 []  - Medium Wound Dressing one or multiple wounds 0 []  - Large Wound Dressing one or multiple wounds 0 []  - Application of Medications - topical 0 []  - Application of Medications - injection 0 INTERVENTIONS - Miscellaneous []  - External ear exam 0 [name] ([id number]) []  - Specimen Collection (cultures, biopsies, blood, body fluids, etc.) 0 []  - Specimen(s) / Culture(s) sent or taken to Lab for analysis 0 []  - Patient Transfer (multiple staff / Harrel Lemon Lift / Similar devices) 0 []  - Simple Staple / Suture removal (25 or less) 0 []  - Complex Staple / Suture removal (26 or more) 0 []  - Hypo / Hyperglycemic Management (close monitor of Blood Glucose) 0 []  - Ankle / Brachial Index (ABI) - do not check if billed separately 0 X - Vital Signs 1 5 Has the patient been seen at the hospital within the last three years: Yes Total Score: 5 Level Of Care: New/Established - Level 1 Electronic Signature(s) Signed: 05/02/2015 9:50:09 AM By: Lorine Bears RCP, RRT, CHT Entered By: Lorine Bears on 05/02/2015 09:35:42 [name] ([id number]) -------------------------------------------------------------------------------- General Visit Notes Details Patient Name: [name] Date of Service: 05/02/2015 10:00 AM Medical Record Number: [id number] Patient Account Number: [id number] Date of Birth/Sex: June 17, 1956 (59 y.o. Female) Treating RN: [name] Primary Care Physician: [name] Other Clinician: Jacqulyn Bath Referring Physician: [name] Treating  Physician/Extender: Frann Rider in Treatment: 16 Notes HBO Safety Director, Jacqulyn Bath asked me to re-check [name] blood pressure. [name] was unable to hear manually and Dynamap showed reading of 76/40. I did manual pressure resulting in pressure of 68/40. MD was notified and patient was instructed to go the the ER for follow-up. Patient and husband understand and agree. Electronic Signature(s) Unsigned Entered By: Gretta Cool, RN, BSN, Kim on 05/02/2015 09:21:38 Signature(s): Date(s): [name] ([id number]) -------------------------------------------------------------------------------- [place] Details Patient Name: [name] Date of Service: 05/02/2015 10:00 AM Medical Record Number: [id number] Patient Account Number: [id number] Date of Birth/Sex: 05/15/1956 (59 y.o. Female) Treating RN: Primary Care Physician: [name] Other Clinician: Jacqulyn Bath Referring Physician: [name] Treating Physician/Extender: Frann Rider in Treatment: 16 Vital Signs Time Taken: 09:05 Temperature (F): 97.9 Height (in): 68 Pulse (bpm): 76 Weight (lbs): 257 Respiratory Rate (breaths/min): 18 Body Mass Index (BMI): 39.1 Blood Pressure (mmHg): 76/40 Capillary Blood Glucose (mg/dl): 170 Reference Range: 80 - 120 mg / dl Notes This first blood pressure reading was taken with the Dynamap. Her blood pressure was rechecked again manually with a reading of 68/40. Per patient's PCP, [name], MD, patient should go to the ED if her pressure is below 80/50 mmHg. Dr. Christin Fudge concurred with this and patient was sent to the ED at [facility]. Electronic Signature(s) Signed: 05/02/2015 9:50:09 AM By: Lorine Bears RCP, RRT, CHT Entered By: Lorine Bears on 05/02/2015 [id number]

## 2015-05-02 NOTE — Progress Notes (Signed)
ASPASIA, FRIEDBERG ([id number]) Visit Report for 05/01/2015 HBO Details Patient Name: [name], [name] Date of Service: 05/01/2015 10:00 AM Medical Record Number: [id number] Patient Account Number: [id number] Date of Birth/Sex: January 29, 1956 (59 y.o. 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 HBO Treatment Details Treatment Number: 13 Patient Type: Outpatient Chamber Type: Monoplace Chamber #: HBO [id number] Treatment Protocol: 2.0 ATA with 90 minutes oxygen, and no air breaks Treatment Details Compression Rate Down: 1.5 psi / minute De-Compression Rate Up: 1.5 psi / minute Air breaks and breathing Compress Tx Pressure Decompress Decompress periods Begins Reached Begins Ends (leave unused spaces blank) Chamber Pressure 1 ATA 2.0 ATA - - - - - - 2.0 ATA 1 ATA Clock Time (24 hr) 09:12 09:22 - - - - - - 10:53 11:03 Treatment Length: 111 (minutes) Treatment Segments: 4 Capillary Blood Glucose Pre Capillary Blood Glucose (mg/dl): Post Capillary Blood Glucose (mg/dl): Vital Signs Capillary Blood Glucose Reference Range: 80 - 120 mg / dl HBO Diabetic Blood Glucose Intervention Range: <131 mg/dl or >249 mg/dl Time Vitals Blood Respiratory Capillary Blood Glucose Pulse Action Type: Pulse: Temperature: Taken: Pressure: Rate: Glucose (mg/dl): Meter #: Oximetry (%) Taken: Pre 08:58 92/50 84 18 98.2 182 1 none Post 11:08 92/50 84 18 97.8 200 1 none Treatment Response Well JAMARI, WALDRON ([id number]) Treatment Toleration: Treatment Treatment Completed without Adverse Event Completion Status: Electronic  Signature(s) Signed: 05/01/2015 12:13:47 PM By: Christin Fudge MD, FACS Signed: 05/01/2015 4:37:19 PM By: Lorine Bears RCP, RRT, CHT Previous Signature: 05/01/2015 11:24:02 AM Version By: Christin Fudge MD, FACS Entered By: Lorine Bears on 05/01/2015 11:49:39 [name] ([id number]) -------------------------------------------------------------------------------- HBO Safety Checklist Details Patient Name: [name] Date of Service: 05/01/2015 10:00 AM Medical Record Number: [id number] Patient Account Number: [id number] Date of Birth/Sex: 1956/01/29 (59 y.o. Female) Treating RN: Primary Care Physician: [name] Other Clinician: Jacqulyn Bath Referring Physician: [name] Treating Physician/Extender: Frann Rider in Treatment: 15 HBO Safety Checklist Items Safety Checklist Consent Form Signed Patient voided / foley secured and emptied When did you last eato 20:00 pm 04/30/15 Last dose of injectable or oral agent 08:00 am Ostomy pouch emptied and vented if applicable n/a All implantable devices assessed, documented and approved ICD Intravenous access site secured and place n/a Valuables secured Linens and cotton and cotton/polyester blend (less than 51% polyester) Personal oil-based products / skin lotions / body lotions removed Wigs or hairpieces removed Smoking or tobacco materials removed Books / newspapers / magazines / loose paper removed Cologne, aftershave, perfume and deodorant removed Jewelry removed (may wrap wedding band) Make-up removed Hair care products removed Battery operated devices (external) removed Heating patches and chemical warmers removed Titanium eyewear removed n/a Nail polish cured greater than 10 hours n/a Casting material cured greater than 10 hours n/a Hearing aids removed n/a Loose dentures or partials removed n/a Prosthetics have been removed n/a Patient demonstrates correct use of air  break device (if applicable) Patient concerns have been addressed Patient grounding bracelet on and cord attached to chamber Specifics for Inpatients (complete in addition to above) Medication sheet sent with patient Intravenous medications needed or due during therapy sent with patient TERRAN, DIMITRIOU ([id number]) Drainage tubes (e.g. nasogastric tube or  chest tube secured and vented) Endotracheal or Tracheotomy tube secured Cuff deflated of air and inflated with saline Airway suctioned Electronic Signature(s) Signed: 05/01/2015 4:37:19 PM By: Lorine Bears RCP, RRT, CHT Entered By: Lorine Bears on 05/01/2015 09:06:13

## 2015-05-02 NOTE — Progress Notes (Signed)
FRIEDA, FRICANO ([id number]) Visit Report for 05/01/2015 Arrival Information Details Patient Name: [name], [name] Date of Service: 05/01/2015 10:00 AM Medical Record Number: [id number] Patient Account Number: [id number] Date of Birth/Sex: [birth year] (59 y.o. Female) Treating RN: Primary Care Physician: [name] Other Clinician: Jacqulyn Bath Referring Physician: [name] Treating Physician/Extender: Frann Rider in Treatment: 15 Visit Information History Since Last Visit Added or deleted any medications: No Patient Arrived: Wheel Chair Any new allergies or adverse reactions: No Arrival Time: 08:55 Had a fall or experienced change in No Accompanied By: husband activities of daily living that may affect Transfer Assistance: Manual risk of falls: Patient Identification Verified: Yes Signs or symptoms of abuse/neglect No Secondary Verification Process Yes since last visito Completed: Hospitalized since last visit: No Patient Requires Transmission- No Has Dressing in Place as Prescribed: Yes Based Precautions: Has Footwear/Offloading in Place as Yes Patient Has Alerts: Yes Prescribed: Patient Alerts: Patient on Blood Left: Surgical Shoe with Thinner Pressure Relief ABI L:0.8, Insole R:0.93 Right: Surgical Shoe with Pressure Relief Insole Pain Present Now: No Electronic Signature(s) Signed: 05/01/2015 4:37:19 PM By: Lorine Bears RCP, RRT, CHT Entered By: Lorine Bears on 05/01/2015 09:03:55 [name] ([id number]) -------------------------------------------------------------------------------- Encounter Discharge Information Details Patient Name: [name] Date of Service: 05/01/2015 10:00 AM Medical Record Number: [id number] Patient Account Number: [id number] Date of Birth/Sex: 1955/10/12 (59 y.o. Female) Treating RN: Primary Care Physician: [name] Other Clinician: Jacqulyn Bath Referring  Physician: [name] Treating Physician/Extender: Frann Rider in Treatment: 15 Encounter Discharge Information Items Discharge Pain Level: 0 Discharge Condition: Stable Ambulatory Status: Wheelchair Discharge Destination: Home Private Transportation: Auto Accompanied By: husband Schedule Follow-up Appointment: No Medication Reconciliation completed and No provided to Patient/Care [name]: Clinical Summary of Care: Notes Patient has an HBO treatment scheduled on 05/02/15 at 10:00 am. Electronic Signature(s) Signed: 05/01/2015 4:37:19 PM By: Lorine Bears RCP, RRT, CHT Entered By: Lorine Bears on 05/01/2015 11:50:31 [name] ([id number]) -------------------------------------------------------------------------------- [place] Details Patient Name: [name] Date of Service: 05/01/2015 10:00 AM Medical Record Number: [id number] Patient Account Number: [id number] Date of Birth/Sex: May 06, 1956 (59 y.o. Female) Treating RN: Primary Care Physician: [name] Other Clinician: Jacqulyn Bath Referring Physician: [name] Treating Physician/Extender: Frann Rider in Treatment: 15 Vital Signs Time Taken: 08:58 Temperature (F): 98.2 Height (in): 68 Pulse (bpm): 84 Weight (lbs): 257 Respiratory Rate (breaths/min): 18 Body Mass Index (BMI): 39.1 Blood Pressure (mmHg): 92/50 Capillary Blood Glucose (mg/dl): 182 Reference Range: 80 - 120 mg / dl Electronic Signature(s) Signed: 05/01/2015 4:37:19 PM By: Lorine Bears RCP, RRT, CHT Entered By: Becky Sax, Amado Nash on 05/01/2015 09:04:22

## 2015-05-02 NOTE — Patient Instructions (Signed)

## 2015-05-03 ENCOUNTER — Encounter: Payer: PPO | Admitting: General Surgery

## 2015-05-03 DIAGNOSIS — L97422 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 layer exposed: Secondary | ICD-10-CM | POA: Diagnosis not present

## 2015-05-03 LAB — CNS IGG SYNTHESIS RATE, CSF+BLOOD
Albumin, CSF: 27.8 mg/dL (ref 8.0–42.0)
Albumin, Serum(Neph): 3.1 g/dL — ABNORMAL LOW (ref 3.2–4.6)
IGG INDEX, CSF: 0.45 (ref <0.66)
IGG, CSF: 6 mg/dL (ref 0.8–7.7)
IgG, Serum: 1490 mg/dL (ref 694–1618)
MS CNS IgG Synthesis Rate: -5 mg/24 h (ref <=3.3)

## 2015-05-03 LAB — GLUCOSE, CAPILLARY
GLUCOSE-CAPILLARY: 283 mg/dL — AB (ref 65–99)
Glucose-Capillary: 179 mg/dL — ABNORMAL HIGH (ref 65–99)

## 2015-05-03 NOTE — Progress Notes (Signed)
[name], [name] ([id number]) Visit Report for 05/03/2015 HBO Details Patient Name: [name], [name] Date of Service: 05/03/2015 10:00 AM Medical Record Number: [id number] Patient Account Number: [id number] Date of Birth/Sex: Jun 10, 1956 (59 y.o. 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 HBO Treatment Details Treatment Number: 14 Patient Type: Outpatient Chamber Type: Monoplace Chamber #: HBO [id number] Treatment Protocol: 2.0 ATA with 90 minutes oxygen, and no air breaks Treatment Details Compression Rate Down: 1.5 psi / minute De-Compression Rate Up: 1.5 psi / minute Air breaks and breathing Compress Tx Pressure Decompress Decompress periods Begins Reached Begins Ends (leave unused spaces blank) Chamber Pressure 1 ATA 2.0 ATA - - - - - - 2.0 ATA 1 ATA Clock Time (24 hr) 09:38 09:48 - - - - - - 11:19 11:29 Treatment Length: 111 (minutes) Treatment Segments: 4 Capillary Blood Glucose Pre Capillary Blood Glucose (mg/dl): Post Capillary Blood Glucose (mg/dl): Vital Signs Capillary Blood Glucose Reference Range: 80 - 120 mg / dl HBO Diabetic Blood Glucose Intervention Range: <131 mg/dl or >249 mg/dl Time Vitals Blood Respiratory Capillary Blood Glucose Pulse Action Type: Pulse: Temperature: Taken: Pressure: Rate: Glucose (mg/dl): Meter #: Oximetry (%) Taken: Pre 09:23 88/52 78 18 97.9 179 1 none Post 11:37 128/74 78 18 97.8 283 1 none Treatment Response Well [name], [name] ([id number]) Treatment Toleration: Treatment Treatment Completed without Adverse Event Completion  Status: Electronic Signature(s) Signed: 05/03/2015 11:51:49 AM By: Lorine Bears RCP, RRT, CHT Entered By: Lorine Bears on 05/03/2015 11:50:02 [name] ([id number]) -------------------------------------------------------------------------------- HBO Safety Checklist Details Patient Name: [name] Date of Service: 05/03/2015 10:00 AM Medical Record Number: [id number] Patient Account Number: [id number] Date of Birth/Sex: [birth year] (59 y.o. Female) Treating RN: Primary Care Physician: [name] Other Clinician: Jacqulyn Bath Referring Physician: [name] Treating Physician/Extender: Benjaman Pott in Treatment: 16 HBO Safety Checklist Items Safety Checklist Consent Form Signed Patient voided / foley secured and emptied When did you last eato 08:00 am Last dose of injectable or oral agent 08:00 am Ostomy pouch emptied and vented if applicable n/a All implantable devices assessed, documented and approved ICD Intravenous access site secured and place n/a Valuables secured Linens and cotton and cotton/polyester blend (less than 51% polyester) Personal oil-based products / skin lotions / body lotions removed Wigs or hairpieces removed Smoking or tobacco materials removed Books / newspapers / magazines / loose paper removed Cologne, aftershave, perfume and deodorant removed Jewelry removed (may wrap wedding band) Make-up removed Hair care products removed Battery operated devices (external) removed Heating patches and chemical warmers removed Titanium eyewear removed n/a Nail polish cured greater than 10 hours Casting material cured greater than 10 hours n/a Hearing aids removed n/a Loose dentures or partials removed n/a Prosthetics have been removed n/a Patient demonstrates correct use of air break device (if applicable) Patient concerns have been addressed Patient grounding bracelet on and cord attached to  chamber Specifics for Inpatients (complete in addition to above) Medication sheet sent with patient Intravenous medications needed or due during therapy sent with patient [name], [name] ([id number]) Drainage tubes (e.g. nasogastric tube or chest tube secured and vented) Endotracheal or Tracheotomy tube secured Cuff deflated of air and inflated with saline Airway suctioned Electronic Signature(s)  Signed: 05/03/2015 12:43:00 PM By: Judene Companion MD Entered By: Judene Companion on 05/03/2015 11:30:54

## 2015-05-03 NOTE — Progress Notes (Signed)
[name] ([id number]) Visit Report for 05/03/2015 Arrival Information Details Patient Name: [name] Date of Service: 05/03/2015 10:00 AM Medical Record Number: [id number] Patient Account Number: [id number] Date of Birth/Sex: 1956-06-29 (59 y.o. Female) Treating RN: Primary Care Physician: [name] Other Clinician: Jacqulyn Bath Referring Physician: [name] Treating Physician/Extender: Benjaman Pott in Treatment: 16 Visit Information History Since Last Visit Added or deleted any medications: No Patient Arrived: Wheel Chair Any new allergies or adverse reactions: No Arrival Time: 09:20 Had a fall or experienced change in No Accompanied By: husband activities of daily living that may affect Transfer Assistance: Stretcher risk of falls: Patient Identification Verified: Yes Signs or symptoms of abuse/neglect No Secondary Verification Process Yes since last visito Completed: Hospitalized since last visit: No Patient Requires Transmission- No Has Dressing in Place as Prescribed: Yes Based Precautions: Has Footwear/Offloading in Place as Yes Patient Has Alerts: Yes Prescribed: Patient Alerts: Patient on Blood Left: Surgical Shoe with Thinner Pressure Relief ABI L:0.8, Insole R:0.93 Right: Surgical Shoe with Pressure Relief Insole Pain Present Now: No Electronic Signature(s) Signed: 05/03/2015 12:43:00 PM By: Judene Companion MD Entered By: Judene Companion on 05/03/2015 11:30:31 [name] ([id number]) -------------------------------------------------------------------------------- Encounter Discharge Information Details Patient Name: [name] Date of Service: 05/03/2015 10:00 AM Medical Record Number: [id number] Patient Account Number: [id number] Date of Birth/Sex: 1956/04/10 (59 y.o. Female) Treating RN: Primary Care Physician: [name] Other Clinician: Jacqulyn Bath Referring Physician: [name] Treating  Physician/Extender: Benjaman Pott in Treatment: 40 Encounter Discharge Information Items Discharge Pain Level: 0 Discharge Condition: Stable Ambulatory Status: Wheelchair Discharge Destination: Home Private Transportation: Auto Accompanied By: husband Schedule Follow-up Appointment: No Medication Reconciliation completed and No provided to Patient/Care Serafin [place]: Clinical Summary of Care: Notes Patient has an HBO treatment scheduled on 05/04/15 at 10:00 am. Electronic Signature(s) Signed: 05/03/2015 11:51:49 AM By: Lorine Bears RCP, RRT, CHT Entered By: Lorine Bears on 05/03/2015 11:51:02 [name] ([id number]) -------------------------------------------------------------------------------- Patient/Caregiver Education Details Patient Name: [name] Date of Service: 05/03/2015 10:00 AM Medical Record Number: [id number] Patient Account Number: [id number] Date of Birth/Gender: [birth year] (59 y.o. Female) Treating RN: Primary Care Physician: [name] Other Clinician: Jacqulyn Bath Referring Physician: [name] Treating Physician/Extender: Benjaman Pott in Treatment: 16 Education Assessment Education Provided To: Patient Education Topics Provided Electronic Signature(s) Signed: 05/03/2015 12:43:00 PM By: Judene Companion MD Entered By: Judene Companion on 05/03/2015 11:33:37 [name] ([id number]) -------------------------------------------------------------------------------- [place] Details Patient Name: [name] Date of Service: 05/03/2015 10:00 AM Medical Record Number: [id number] Patient Account Number: [id number] Date of Birth/Sex: 11/21/1955 (59 y.o. Female) Treating RN: Primary Care Physician: [name] Other Clinician: Jacqulyn Bath Referring Physician: [name] Treating Physician/Extender: Benjaman Pott in Treatment: 16 Vital Signs Time Taken: 09:23 Temperature  (F): 97.9 Height (in): 68 Pulse (bpm): 78 Weight (lbs): 257 Respiratory Rate (breaths/min): 18 Body Mass Index (BMI): 39.1 Blood Pressure (mmHg): 88/52 Capillary Blood Glucose (mg/dl): 179 Reference Range: 80 - 120 mg / dl Electronic Signature(s) Signed: 05/03/2015 12:43:00 PM By: Judene Companion MD Entered By: Judene Companion on 05/03/2015 11:30:43

## 2015-05-03 NOTE — Progress Notes (Signed)
AVIEL, VITTONE ([id number]) Visit Report for 05/03/2015 Physician Orders Details Patient Name: [name], [name] Date of Service: 05/03/2015 10:00 AM Medical Record Number: [id number] Patient Account Number: [id number] Date of Birth/Sex: 1955/10/28 (59 y.o. Female) Treating RN: Primary Care Physician: [name] Other Clinician: Jacqulyn Bath Referring Physician: [name] Treating Physician/Extender: Benjaman Pott in Treatment: 35 Verbal / Phone Orders: No Diagnosis Coding ICD-10 Coding Code Description E11.621 Type 2 diabetes mellitus with foot ulcer L97.422 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 layer exposed L97.522 Non-pressure chronic ulcer of other part of left foot with fat layer exposed [id number] Chronic systolic (congestive) heart failure E11.42 Type 2 diabetes mellitus with diabetic polyneuropathy S00.33XA Contusion of nose, initial encounter 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, 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, stage 3 L89.323 Pressure ulcer of left buttock, stage 3 L97.512 Non-pressure chronic ulcer of other part of right foot with fat layer exposed L02.611 Cutaneous abscess of right foot Electronic Signature(s) Signed: 05/03/2015 12:43:00 PM By: Judene Companion MD Entered By: Judene Companion on 05/03/2015 11:33:28 [name] ([id number]) -------------------------------------------------------------------------------- Problem List Details Patient Name: [name] Date of Service: 05/03/2015 10:00 AM Medical Record Number: [id number] Patient Account Number: [id number] Date of Birth/Sex: [birth year] (59 y.o. Female) Treating RN: Primary Care Physician: [name] Other Clinician: Jacqulyn Bath Referring Physician: [name] Treating Physician/Extender: Benjaman Pott in Treatment: 16 Active Problems ICD-10 Encounter Code Description Active Date Diagnosis E11.621 Type 2  diabetes mellitus with foot ulcer 01/10/2015 Yes L97.422 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 01/10/2015 Yes layer exposed L97.522 Non-pressure chronic ulcer of other part of left foot with fat 01/10/2015 Yes layer exposed [id number] Chronic systolic (congestive) heart failure 01/10/2015 Yes E11.42 Type 2 diabetes mellitus with diabetic polyneuropathy 01/10/2015 Yes S00.33XA Contusion of nose, initial encounter 01/17/2015 Yes 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, left ankle and foot 02/13/2015 Yes 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, stage 3 03/03/2015 Yes L89.323 Pressure ulcer of left buttock, stage 3 03/03/2015 Yes L97.512 Non-pressure chronic ulcer of other part of right foot with 03/10/2015 Yes fat layer exposed JASIME, SILL ([id number]) L02.611 Cutaneous abscess of right foot 04/20/2015 Yes Inactive Problems Resolved Problems Electronic Signature(s) Signed: 05/03/2015 12:43:00 PM By: Judene Companion MD Entered By: Judene Companion on 05/03/2015 11:33:12 [name] ([id number]) -------------------------------------------------------------------------------- [place] Details Patient Name: [name] Date of Service: 05/03/2015 Medical Record Number: [id number] Patient Account Number: [id number] Date of Birth/Sex: Feb 17, 1956 (59 y.o. Female) Treating RN: Primary Care Physician: [name] Other Clinician: Jacqulyn Bath Referring Physician: [name] Treating Physician/Extender: Benjaman Pott in Treatment: 16 Diagnosis Coding ICD-10 Codes Code Description E11.621 Type 2 diabetes mellitus with foot ulcer L97.422 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 layer exposed L97.522 Non-pressure chronic ulcer of other part of left foot with fat layer exposed [id number] Chronic systolic (congestive) heart failure E11.42 Type 2 diabetes mellitus with diabetic polyneuropathy S00.33XA Contusion of nose, initial encounter M86.372  Chronic multifocal osteomyelitis, 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, stage 3 L89.323 Pressure ulcer of left buttock, stage 3 L97.512 Non-pressure chronic ulcer of other part of right foot with fat layer exposed L02.611 Cutaneous abscess of right foot Facility Procedures CPT4 Code: [id number] Description: (Facility Use Only) HBOT, full body chamber, 22min ICD-10 Description Diagnosis 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, left ankle and fo Modifier: ot Quantity: 4 Physician Procedures CPT4 Code Description: [id number] - WC PHYS LEVEL  1 EST PT ICD-10 Description Diagnosis E11.621 Type 2 diabetes mellitus with foot ulcer Modifier: Quantity: 1 CPT4 Code Description: [id number] - WC PHYS HYPERBARIC OXYGEN THERAPY ICD-10 Description Diagnosis 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

## 2015-05-04 ENCOUNTER — Encounter (HOSPITAL_BASED_OUTPATIENT_CLINIC_OR_DEPARTMENT_OTHER): Payer: PPO | Admitting: General Surgery

## 2015-05-04 ENCOUNTER — Encounter: Payer: Self-pay | Admitting: General Surgery

## 2015-05-04 DIAGNOSIS — M86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, left ankle and foot: Secondary | ICD-10-CM

## 2015-05-04 DIAGNOSIS — L97512 Non-pressure chronic ulcer of other part of right foot with fat layer exposed: Secondary | ICD-10-CM | POA: Diagnosis not present

## 2015-05-04 DIAGNOSIS — L97422 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 layer exposed: Secondary | ICD-10-CM | POA: Diagnosis not present

## 2015-05-04 LAB — GLUCOSE, CAPILLARY
Glucose-Capillary: 163 mg/dL — ABNORMAL HIGH (ref 65–99)
Glucose-Capillary: 147 mg/dL — ABNORMAL HIGH (ref 65–99)

## 2015-05-04 NOTE — Progress Notes (Signed)
See i heal 

## 2015-05-04 NOTE — Progress Notes (Addendum)
KATTY, KINARD ([id number]) Visit Report for 05/04/2015 Chief Complaint Document Details Patient Name: [name], [name] Date of Service: 05/04/2015 8:45 AM Medical Record Number: [id number] Patient Account Number: [id number] Date of Birth/Sex: May 02, 1956 (59 y.o. Female) Treating RN: Primary Care Physician: [name] Other Clinician: Referring Physician: [name] Treating Physician/Extender: Benjaman Pott in Treatment: 16 Information Obtained from: Patient Chief Complaint Patient presents to the wound care center for a consult due non healing wound 59 year old patient who comes with a history of an ulcer on the left second toe for at least about 4 months and a history on her left heel for about 3 weeks. I needed to get an MRI of her left foot but due to her defibrillator this could not be done and hence she is getting get a triple phase bone scan. 01/17/2015 -- she has developed a significant injury to the bridge of the nose where her CPAP machine was fitting. This is a painfull dark spot. Electronic Signature(s) Signed: 05/04/2015 9:21:05 AM By: Judene Companion MD Entered By: Judene Companion on 05/04/2015 09:21:05 [name] ([id number]) -------------------------------------------------------------------------------- Debridement Details Patient Name: [name] Date of Service: 05/04/2015 8:45 AM Medical Record Number: [id number] Patient Account Number: [id number] Date of Birth/Sex: [birth year] (59 y.o. Female) Treating RN: Montey Hora Primary Care Physician: [name] Other Clinician: Referring Physician: [name] Treating Physician/Extender: Benjaman Pott in Treatment: 16 Debridement Performed for Wound #7 Right Toe Second Assessment: Performed By: Physician Judene Companion, MD Debridement: Debridement Pre-procedure Yes Verification/Time Out Taken: Start Time: 09:27 Pain Control: Lidocaine 4% Topical Solution Level:  Skin/Subcutaneous Tissue Total Area Debrided (L x 0.5 (cm) x 0.7 (cm) = 0.35 (cm) W): Tissue and other Viable, Non-Viable, Callus material debrided: Instrument: Scissors Bleeding: Minimum Hemostasis Achieved: Pressure End Time: 09:29 Procedural Pain: 0 Post Procedural Pain: 0 Response to Treatment: Procedure was tolerated well Post Debridement Measurements of Total Wound Length: (cm) 0.7 Width: (cm) 0.8 Depth: (cm) 0.3 Volume: (cm) 0.132 Electronic Signature(s) Signed: 05/04/2015 5:11:52 PM By: Montey Hora Entered By: Montey Hora on 05/04/2015 09:33:31 [name] ([id number]) -------------------------------------------------------------------------------- HPI Details Patient Name: [name] Date of Service: 05/04/2015 8:45 AM Medical Record Number: [id number] Patient Account Number: [id number] Date of Birth/Sex: 10-10-1955 (59 y.o. Female) Treating RN: Primary Care Physician: [name] Other Clinician: Referring Physician: [name] Treating Physician/Extender: Benjaman Pott in Treatment: 16 History of Present Illness HPI Description: 59 year old patient who is known to have diabetes for several years and generally has it under good control last hemoglobin A1c was 6.7 done 2 weeks ago. She has several other comorbidities including acute CHF, hypoxia, hyperlipidemia, systolic heart failure, myocardial infarction, coronary artery disease, iron deficiency anemia, renal insufficiency, essential hypertension, morbid obesity and obstructive sleep apnea. 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 In the past she's had a coronary angioplasty with stent in 2013, laparoscopic cholecystectomy in 2011, bilateral oophorectomy in April 2012 and she's had some lumbar disc surgery in 1996. She says she's had a lot of peripheral neuropathy and due to this she's had a ulcer on her left second toe for a  while. She had a dry eschar on her left heel and recently this was removed and she found that she had some infection and some drainage from there. She has minimal pain around the heel but no pain on any areas of her toes. she has not been on any antibiotics recently and has known vascular problems  in the recent past. 01/17/2015 left foot x-ray done on April 5 reveals there are destructive changes noted of the distal aspect of the proximal phalanx of the left second digit with extension into the adjacent proximal interphalangeal joint space. These findings suggest osteomyelitis with possible septic arthritis. I was able to make a call to Dr. Nevada Crane the radiologist, and had a detailed discussion with him regarding this x-ray. He noted that there were also some changes in the fifth phalanx on the left foot and this could have been chronic osteomyelitis too. The second toe on the left leg shows findings which cannot differentiate between an acute or chronic osteomyelitis and hence he has recommended a MRI with and without contrast. We will get this organized for her and in the meanwhile treat her with oral antibiotics which is susceptible to Bactrim. She was seen yesterday by her PCP Dr. Chaya Jan who put her on 3 antibiotics which include Levaquin and doxycycline and Bactrim. 01/24/2015 She is scheduled for a triple PHASE bone scan on 01/26/2015. She is still on antibiotics and otherwise doing well. 01/31/2015 Bone Imaging 3 phase study -- IMPRESSION:1. There is increased uptake on all 3 phases localizing to the left hindfoot region. This may be a manifestation of osteomyelitis. Correlation with exact site of nonhealing ulcer. 2. There is increased uptake localizing to the second phalanx of the left foot on the blood pool and delayed phase images. 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 Underlying infection not excluded. I have personally  discussed the x-ray studies with the radiologist Dr. Kerby Moors and after comparing all her films he agrees that the left second phalanx does not have any definite underlying infection and this is [name], [name]. ([id number]) now off a chronic arthropathy. There is a increased uptake in all 3 phases localized to the left hindfoot region and this is a manifestation of osteomyelitis. 02/07/2015 -- She was seen by the ID specialist Dr. Ola Spurr who reviewed her on my request recently. He has recommended a PICC line and IV vancomycin and oral ciprofloxacin for 4 weeks. She will be reviewed by him periodically. He also received several notes from Dr. Sonia Baller to help as documented all the requisites for hyperbaric oxygen therapy. 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 He also had a coronary angioplasty with stent placement in October 2013. Prior to that her 2-D echo showed EF of 35%. Her latest 2-D echo done on 11/01/2014 showed a LV EF of 50-55%. Systolic function and wall motion were within normal limits. Chest x-ray done in January 16, 2015 showed no acute findings. Last hemoglobin A1c done on 12/30/2014 was 6.7. CBC done on the same day was within normal limits, except for a hemoglobin being 8.7 and a hematocrit between 29%. She had arterial duplex study done in August 2015 which showed: No evidence of segmental lower extremity arterial disease at rest, bilaterally. Normal ABI's, bilaterally. Normal great toe pressures, bilaterally. With all the above in place I believe she is a very good candidate for hyperbaric oxygen therapy and I would recommend this with the usual protocol for 6 weeks. she is going to get her PICC line this Friday and the IV antibiotics and when to start. She is also starting on oral ciprofloxacin. 03/03/2015 - recently hospitalized for anemia. No source identified. Started on  amiodarone, contraindication to HBO. Reports left heel feels better. Main complaint  is new buttocks ulcers. No fever or chills. Minimal drainage. 03/10/2015 -- There is a new ulceration noticed yesterday on her right second toe at the tip. She also has noticed some ulcerations on her gluteal region and we will need to take a look at this. As far as her medications go she does not know for sure what her dosage of amiodarone is exactly. She will look it up and let me know today. 03/17/2015 -- as far as her dosage of amiodarone she was taking : 02/25/15 - 03/02/15 400 mg per day; 03/03/15 - 03/10/15 200 mg per day. She stopped taking the medication on 03/10/15. She has not yet done the checks x-ray and her PFTs are still pending. Recent Lab work done on 03/13/2015 shows a glucose is 124, K+ was 3.3, her albumen was 2.9, WBC count was 3.4, hemoglobin was 9.3, hematocrit was 30.4 and platelets were 240. C-reactive protein was 33.7 which was high and the Vancomycin trough was 18.6 which was high. 03/24/2015 -- he saw several doctors since I saw her last and they include her medical oncologist, cardiologist, neurologist and infectious disease doctor. Her PICC line is out and she now is on oral Cipro and doxycycline as per her verbal report. She has completed her pulmonary function test yesterday and a recent chest x-ray is within normal limits. If the PFTs within normal limits she will be able to restart the hyperbaric oxygen therapy. 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 Underlying interstitial lung disease to be considered. Further recommendations were that of a CT chest and pulmonary consult if indicated. Electronic Signature(s) Signed: 05/04/2015 9:22:11 AM By: Judene Companion MD [name] ([id number]) Entered By: Judene Companion on 05/04/2015 09:22:11 [name]  ([id number]) -------------------------------------------------------------------------------- Physical Exam Details Patient Name: [name] Date of Service: 05/04/2015 8:45 AM Medical Record Number: [id number] Patient Account Number: [id number] Date of Birth/Sex: 06-30-1956 (59 y.o. Female) Treating RN: Primary Care Physician: [name] Other Clinician: Referring Physician: [name] Treating Physician/Extender: Benjaman Pott in Treatment: 16 Electronic Signature(s) Signed: 05/04/2015 9:22:19 AM By: Judene Companion MD Entered By: Judene Companion on 05/04/2015 09:22:19 [name] ([id number]) -------------------------------------------------------------------------------- Physician Orders Details Patient Name: [name] Date of Service: 05/04/2015 8:45 AM Medical Record Number: [id number] Patient Account Number: [id number] Date of Birth/Sex: 07-23-1956 (59 y.o. 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 o May Shower, gently pat wound dry prior to applying new dressing. o May shower with protection. Wound #4 Medial Coccyx o Clean wound with Normal Saline. o  May Shower, gently pat wound dry prior to applying new dressing. o May shower with protection. Wound #7 Right Toe Second o Clean wound with Normal Saline. o May Shower, gently pat wound dry prior to applying new dressing. o May shower with protection. Anesthetic Wound #3 Nose o Topical Lidocaine 4% cream applied to wound bed prior to debridement [name], [name] ([id number]) Wound #4 Medial Coccyx o Topical Lidocaine 4% cream applied to wound bed prior to debridement Wound #7 Right Toe Second o Topical Lidocaine 4% cream applied to wound bed prior to debridement Primary Wound Dressing Wound #3 Nose o Other: - betadine paint Wound #4 Medial Coccyx o Prisma Ag Wound #7 Right Toe Second o Aquacel Ag Secondary Dressing Wound #4 Medial Coccyx o Boardered Foam Dressing Wound #7 Right Toe Second o Gauze and Kerlix/Conform Dressing Change Frequency Wound #3 Nose o Change dressing every other day. Wound #4 Medial Coccyx o Change dressing every other day. Wound #7 Right Toe Second o Change dressing every other day. Follow-up Appointments Wound #3 Nose o Return Appointment in 1 week. Wound #4 Medial Coccyx o Return Appointment in 1 week. Wound #7 Right Toe Second o Return Appointment in 1 week. Off-Loading Wound #4 Medial Coccyx o Turn and reposition every 2 hours [name], [name]. ([id number]) Wound #7 Right Toe Second o Heel suspension boot to: - Sage boots Additional Orders / Instructions Wound #3 Nose o Increase protein intake. o Activity as tolerated Wound #4 Medial Coccyx o Increase protein intake. o Activity as tolerated Wound #7 Right Toe Second o Increase protein intake. o Activity  as tolerated Home Health Wound #3 [place] Visits - [place] Nurse may visit PRN to address patientos wound care needs. o FACE TO FACE ENCOUNTER: MEDICARE and MEDICAID PATIENTS: I certify that this patient is under my care and that I had a face-to-face encounter that meets the physician face-to-face encounter requirements with this patient on this date. The encounter with the patient was in whole or in part for the following MEDICAL CONDITION: (primary reason for [place]) MEDICAL NECESSITY: I certify, that based on my findings, NURSING services are a medically necessary home health service. HOME BOUND STATUS: I certify that my clinical findings support that this patient is homebound (i.e., Due to illness or injury, pt requires aid of supportive devices such as crutches, cane, wheelchairs, walkers, the use of special transportation or the assistance of another person to leave their place of residence. There is a normal inability to leave the home and doing so requires considerable and taxing effort. Other absences are for medical reasons / religious services and are infrequent or of short duration when for other reasons). o If current dressing causes regression in wound condition, may D/C ordered dressing product/s and apply Normal Saline Moist Dressing daily until next [place] / Other MD appointment. [place] of regression in wound condition at [phone]. o Please direct any NON-WOUND related issues/requests for orders to patient's Primary Care Physician Wound #4 [place] Visits - [place] Nurse may visit PRN to address patientos wound care needs. o FACE TO FACE ENCOUNTER: MEDICARE and MEDICAID PATIENTS: I certify that this patient is under my care and that I had a face-to-face encounter that meets the physician face-to-face encounter requirements with this  patient on this date. The encounter with the patient was in whole or in part for the following MEDICAL CONDITION: (  primary reason for Home Healthcare) MEDICAL NECESSITY: I certify, that based on my findings, NURSING services are a medically necessary home health service. HOME BOUND STATUS: I certify that my clinical findings support that this patient is homebound (i.e., Due to illness or injury, pt requires aid of ARIONNE, [name]. ([id number]) supportive devices such as crutches, cane, wheelchairs, walkers, the use of special transportation or the assistance of another person to leave their place of residence. There is a normal inability to leave the home and doing so requires considerable and taxing effort. Other absences are for medical reasons / religious services and are infrequent or of short duration when for other reasons). o If current dressing causes regression in wound condition, may D/C ordered dressing product/s and apply Normal Saline Moist Dressing daily until next [place] / Other MD appointment. [place] of regression in wound condition at [phone]. o Please direct any NON-WOUND related issues/requests for orders to patient's Primary Care Physician Wound #7 Right Toe Second o [place] Visits - [place] Nurse may visit PRN to address patientos wound care needs. o FACE TO FACE ENCOUNTER: MEDICARE and MEDICAID PATIENTS: I certify that this patient is under my care and that I had a face-to-face encounter that meets the physician face-to-face encounter requirements with this patient on this date. The encounter with the patient was in whole or in part for the following MEDICAL CONDITION: (primary reason for [place]) MEDICAL NECESSITY: I certify, that based on my findings, NURSING services are a medically necessary home health service. HOME BOUND STATUS: I certify that my clinical findings support that  this patient is homebound (i.e., Due to illness or injury, pt requires aid of supportive devices such as crutches, cane, wheelchairs, walkers, the use of special transportation or the assistance of another person to leave their place of residence. There is a normal inability to leave the home and doing so requires considerable and taxing effort. Other absences are for medical reasons / religious services and are infrequent or of short duration when for other reasons). o If current dressing causes regression in wound condition, may D/C ordered dressing product/s and apply Normal Saline Moist Dressing daily until next [place] / Other MD appointment. [place] of regression in wound condition at [phone]. o Please direct any NON-WOUND related issues/requests for orders to patient's Primary Care Physician Electronic Signature(s) Signed: 05/04/2015 5:11:52 PM By: Montey Hora Entered By: Montey Hora on 05/04/2015 09:35:12 [name] ([id number]) -------------------------------------------------------------------------------- Problem List Details Patient Name: [name] Date of Service: 05/04/2015 8:45 AM Medical Record Number: [id number] Patient Account Number: [id number] Date of Birth/Sex: 1956-09-06 (59 y.o. Female) Treating RN: Primary Care Physician: [name] Other Clinician: Referring Physician: [name] Treating Physician/Extender: Benjaman Pott in Treatment: 16 Active Problems ICD-10 Encounter Code Description Active Date Diagnosis E11.621 Type 2 diabetes mellitus with foot ulcer 01/10/2015 Yes L97.422 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 01/10/2015 Yes layer exposed L97.522 Non-pressure chronic ulcer of other part of left foot with fat 01/10/2015 Yes layer exposed [id number] Chronic systolic (congestive) heart failure 01/10/2015 Yes E11.42 Type 2 diabetes mellitus with diabetic polyneuropathy  01/10/2015 Yes S00.33XA Contusion of nose, initial encounter 01/17/2015 Yes 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, left ankle and foot 02/13/2015 Yes 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, stage 3 03/03/2015 Yes L89.323 Pressure ulcer of left buttock, stage 3 03/03/2015 Yes L97.512 Non-pressure chronic ulcer of other part of  right foot with 03/10/2015 Yes fat layer exposed [name], [name] ([id number]) L02.611 Cutaneous abscess of right foot 04/20/2015 Yes Inactive Problems Resolved Problems Electronic Signature(s) Signed: 05/04/2015 9:20:36 AM By: Judene Companion MD Entered By: Judene Companion on 05/04/2015 09:20:36 [name] ([id number]) -------------------------------------------------------------------------------- Progress Note Details Patient Name: [name] Date of Service: 05/04/2015 8:45 AM Medical Record Number: [id number] Patient Account Number: [id number] Date of Birth/Sex: 1956/09/13 (59 y.o. Female) Treating RN: Primary Care Physician: [name] Other Clinician: Referring Physician: [name] Treating Physician/Extender: Benjaman Pott in Treatment: 16 Subjective Chief Complaint Information obtained from Patient Patient presents to the wound care center for a consult due non healing wound 59 year old patient who comes with a history of an ulcer on the left second toe for at least about 4 months and a history on her left heel for about 3 weeks. I needed to get an MRI of her left foot but due to her defibrillator this could not be done and hence she is getting get a triple phase bone scan. 01/17/2015 -- she has developed a significant injury to the bridge of the nose where her CPAP machine was fitting. This is a painfull dark spot. History of Present Illness (HPI) 59 year old patient who is known to have diabetes for several years and generally has it under good control last hemoglobin A1c was 6.7 done 2 weeks ago. She has several other  comorbidities including acute CHF, hypoxia, hyperlipidemia, systolic heart failure, myocardial infarction, coronary artery disease, iron deficiency anemia, renal insufficiency, essential hypertension, morbid obesity and obstructive sleep apnea. 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 In the past she's had a coronary angioplasty with stent in 2013, laparoscopic cholecystectomy in 2011, bilateral oophorectomy in April 2012 and she's had some lumbar disc surgery in 1996. She says she's had a lot of peripheral neuropathy and due to this she's had a ulcer on her left second toe for a while. She had a dry eschar on her left heel and recently this was removed and she found that she had some infection and some drainage from there. She has minimal pain around the heel but no pain on any areas of her toes. she has not been on any antibiotics recently and has known vascular problems in the recent past. 01/17/2015 left foot x-ray done on April 5 reveals there are destructive changes noted of the distal aspect of the proximal phalanx of the left second digit with extension into the adjacent proximal interphalangeal joint space. These findings suggest osteomyelitis with possible septic arthritis. I was able to make a call to Dr. Nevada Crane the radiologist, and had a detailed discussion with him regarding this x-ray. He noted that there were also some changes in the fifth phalanx on the left foot and this could have been chronic osteomyelitis too. The second toe on the left leg shows findings which cannot differentiate between an acute or chronic osteomyelitis and hence he has recommended a MRI with and without contrast. We will get this organized for her and in the meanwhile treat her with oral antibiotics which is susceptible to Bactrim. She was seen yesterday by her PCP Dr. Chaya Jan who put her on 3 antibiotics which include Levaquin and doxycycline and  Bactrim. 01/24/2015 She is scheduled for a triple PHASE bone scan on 01/26/2015. She is still on antibiotics and otherwise doing well. [name], [name] ([id number]) 01/31/2015 Bone Imaging 3 phase study -- IMPRESSION:1. There is increased uptake  on all 3 phases localizing to the left hindfoot region. This may be a manifestation of osteomyelitis. Correlation with exact site of nonhealing ulcer. 2. There is increased uptake localizing to the second phalanx of the left foot on the blood pool and delayed phase images. 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 Underlying infection not excluded. I have personally discussed the x-ray studies with the radiologist Dr. Kerby Moors and after comparing all her films he agrees that the left second phalanx does not have any definite underlying infection and this is now off a chronic arthropathy. There is a increased uptake in all 3 phases localized to the left hindfoot region and this is a manifestation of osteomyelitis. 02/07/2015 -- She was seen by the ID specialist Dr. Ola Spurr who reviewed her on my request recently. He has recommended a PICC line and IV vancomycin and oral ciprofloxacin for 4 weeks. She will be reviewed by him periodically. He also received several notes from Dr. Sonia Baller to help as documented all the requisites for hyperbaric oxygen therapy. 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 He also had a coronary angioplasty with stent placement in October 2013. Prior to that her 2-D echo showed EF of 35%. Her latest 2-D echo done on 11/01/2014 showed a LV EF of 50-55%. Systolic function and wall motion were within normal limits. Chest x-ray done in January 16, 2015 showed no acute findings. Last hemoglobin A1c done on 12/30/2014 was 6.7. CBC done on the same day was within normal limits, except for a hemoglobin  being 8.7 and a hematocrit between 29%. She had arterial duplex study done in August 2015 which showed: No evidence of segmental lower extremity arterial disease at rest, bilaterally. Normal ABI's, bilaterally. Normal great toe pressures, bilaterally. With all the above in place I believe she is a very good candidate for hyperbaric oxygen therapy and I would recommend this with the usual protocol for 6 weeks. she is going to get her PICC line this Friday and the IV antibiotics and when to start. She is also starting on oral ciprofloxacin. 03/03/2015 - recently hospitalized for anemia. No source identified. Started on amiodarone, contraindication to HBO. Reports left heel feels better. Main complaint is new buttocks ulcers. No fever or chills. Minimal drainage. 03/10/2015 -- There is a new ulceration noticed yesterday on her right second toe at the tip. She also has noticed some ulcerations on her gluteal region and we will need to take a look at this. As far as her medications go she does not know for sure what her dosage of amiodarone is exactly. She will look it up and let me know today. 03/17/2015 -- as far as her dosage of amiodarone she was taking : 02/25/15 - 03/02/15 400 mg per day; 03/03/15 - 03/10/15 200 mg per day. She stopped taking the medication on 03/10/15. She has not yet done the checks x-ray and her PFTs are still pending. Recent Lab work done on 03/13/2015 shows a glucose is 124, K+ was 3.3, her albumen was 2.9, WBC count was 3.4, hemoglobin was 9.3, hematocrit was 30.4 and platelets were 240. C-reactive protein was 33.7 which was high and the Vancomycin trough was 18.6 which was high. 03/24/2015 -- he saw several doctors since I saw her last and they include her medical oncologist, cardiologist, neurologist and infectious disease doctor. Her PICC line is out and she now is on  oral Cipro and doxycycline as per her verbal report. She has completed her pulmonary function test yesterday  and a [name], [name]. ([id number]) recent chest x-ray is within normal limits. If the PFTs within normal limits she will be able to restart the hyperbaric oxygen therapy. 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 Underlying interstitial lung disease to be considered. Further recommendations were that of a CT chest and pulmonary consult if indicated. Objective Constitutional Vitals Time Taken: 9:04 AM, Height: 68 in, Weight: 257 lbs, BMI: 39.1, Temperature: 97.9 F, Pulse: 77 bpm, Respiratory Rate: 18 breaths/min, Blood Pressure: 119/71 mmHg, Capillary Blood Glucose: 163 mg/dl. Integumentary (Hair, Skin) Wound #3 status is Open. Original cause of wound was Pressure Injury. The wound is located on the Nose. The wound measures 0.6cm length x 0.5cm width x 0.1cm depth; 0.236cm^2 area and 0.024cm^3 volume. The wound is limited to skin breakdown. There is no tunneling or undermining noted. There is a none present amount of drainage noted. The wound margin is flat and intact. There is medium (34-66%) pink granulation within the wound bed. There is a medium (34-66%) amount of necrotic tissue within the wound bed including Eschar. The periwound skin appearance did not exhibit: Callus, Crepitus, Excoriation, Fluctuance, Friable, Induration, Localized Edema, Rash, Scarring, Dry/Scaly, Maceration, Moist, Atrophie Blanche, Cyanosis, Ecchymosis, Hemosiderin Staining, Mottled, Pallor, Rubor, Erythema. Periwound temperature was noted as No Abnormality. Wound #4 status is Open. Original cause of wound was Pressure Injury. The wound is located on the Medial Coccyx. The wound measures 1cm length x 0.3cm width x 0.1cm depth; 0.236cm^2 area and 0.024cm^3 volume. The wound is limited to skin breakdown. There is no undermining noted. There is a medium amount of serous drainage noted. The  wound margin is flat and intact. There is large (67-100%) red, pink granulation within the wound bed. There is no necrotic tissue within the wound bed. The periwound skin appearance exhibited: Callus. The periwound skin appearance did not exhibit: Crepitus, Excoriation, Fluctuance, Friable, Induration, Localized Edema, Rash, Scarring, Dry/Scaly, Maceration, Moist, Atrophie Blanche, Cyanosis, Ecchymosis, Hemosiderin Staining, Mottled, Pallor, Rubor, Erythema. Periwound temperature was noted as No Abnormality. Wound #7 status is Open. Original cause of wound was Gradually Appeared. The wound is located on the Right Toe Second. The wound measures 0.5cm length x 0.7cm width x 0.4cm depth; 0.275cm^2 area and 0.11cm^3 volume. The wound is limited to skin breakdown. There is no tunneling or undermining noted. There is a small amount of serous drainage noted. The wound margin is flat and intact. There is medium (34-66%) red, pink granulation within the wound bed. There is a medium (34-66%) amount of necrotic tissue within the wound bed including Adherent Slough. The periwound skin appearance did not exhibit: Callus, Crepitus, Excoriation, Fluctuance, Friable, Induration, Localized Edema, Rash, Scarring, Dry/Scaly, Maceration, Moist, Atrophie Blanche, Cyanosis, Ecchymosis, Hemosiderin Staining, Mottled, Pallor, Rubor, [name], Vickie S. ([id number]) Erythema. Periwound temperature was noted as No Abnormality. 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 S00.33XA - Contusion of nose, initial encounter M86.372 - Chronic multifocal osteomyelitis, left ankle and foot L89.313 -  Pressure ulcer of right buttock, stage  3 L89.323 - Pressure ulcer of left buttock, stage 3 L97.512 - Non-pressure chronic ulcer of other part of right foot with fat layer exposed L02.611 - Cutaneous abscess of right foot Diagnoses ICD-10 E11.621: Type 2 diabetes mellitus with foot ulcer L97.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: 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 layer exposed L97.522: Non-pressure chronic ulcer of other part of left foot with fat layer exposed [id number]: Chronic systolic (congestive) heart failure E11.42: Type 2 diabetes mellitus with diabetic polyneuropathy S00.33XA: Contusion of nose, initial encounter M86.372: Chronic multifocal osteomyelitis, left ankle and foot L89.313: Pressure ulcer of right buttock, stage 3 L89.323: Pressure ulcer of left buttock, stage 3 L97.512: Non-pressure chronic ulcer of other part of right foot with fat layer exposed L02.611: Cutaneous abscess of right foot Patient Procedures Wound #7 Wound #7 is a Diabetic Wound/Ulcer of the Lower Extremity located on the Right Toe Second . There was a Skin/Subcutaneous Tissue Debridement [id number]) debridement with total area of 0.35 sq cm performed by Judene Companion, MD. with the following instrument(s): Scissors to remove Viable and NonAONESTY, [name]. ([id number]) Viable tissue/material including Callus after achieving pain control using Lidocaine 4% Topical Solution. A time out was conducted prior to the start of the procedure. A Minimum amount of bleeding was controlled with Pressure. The procedure was tolerated well with a pain level of 0 throughout and a pain level of 0 following the procedure. Post Debridement Measurements: 0.7cm length x 0.8cm width x 0.3cm depth; 0.132cm^3 volume. Plan Wound Cleansing: Wound #3 Nose: Clean wound with Normal Saline. May Shower, gently pat wound dry prior to applying new dressing. May shower with protection. Wound #4 Medial Coccyx: Clean wound with Normal  Saline. May Shower, gently pat wound dry prior to applying new dressing. May shower with protection. Wound #7 Right Toe Second: Clean wound with Normal Saline. May Shower, gently pat wound dry prior to applying new dressing. May shower with protection. Anesthetic: Wound #3 Nose: Topical Lidocaine 4% cream applied to wound bed prior to debridement Wound #4 Medial Coccyx: Topical Lidocaine 4% cream applied to wound bed prior to debridement Wound #7 Right Toe Second: Topical Lidocaine 4% cream applied to wound bed prior to debridement Primary Wound Dressing: Wound #3 Nose: Other: - betadine paint Wound #4 Medial Coccyx: Prisma Ag Wound #7 Right Toe Second: Aquacel Ag Secondary Dressing: Wound #4 Medial Coccyx: Boardered Foam Dressing Wound #7 Right Toe Second: Gauze and Kerlix/Conform Dressing Change Frequency: Wound #3 Nose: Change dressing every other day. Wound #4 Medial Coccyx: Change dressing every other day. Wound #7 Right Toe Second: [name], [name] ([id number]) Change dressing every other day. Follow-up Appointments: Wound #3 Nose: Return Appointment in 1 week. Wound #4 Medial Coccyx: Return Appointment in 1 week. Wound #7 Right Toe Second: Return Appointment in 1 week. Off-Loading: Wound #4 Medial Coccyx: Turn and reposition every 2 hours Wound #7 Right Toe Second: Heel suspension boot to: - Sage boots Additional Orders / Instructions: Wound #3 Nose: Increase protein intake. Activity as tolerated Wound #4 Medial Coccyx: Increase protein intake. Activity as tolerated Wound #7 Right Toe Second: Increase protein intake. Activity as tolerated Home Health: Wound #3 Nose: Continue Home Health Visits - [place] Nurse may visit PRN to address patient s wound care needs. FACE TO FACE ENCOUNTER: MEDICARE and MEDICAID PATIENTS: I certify that this patient is under my care and that I had a face-to-face encounter that meets the physician  face-to-face encounter requirements with this patient on this date.  The encounter with the patient was in whole or in part for the following MEDICAL CONDITION: (primary reason for [place]) MEDICAL NECESSITY: I certify, that based on my findings, NURSING services are a medically necessary home health service. HOME BOUND STATUS: I certify that my clinical findings support that this patient is homebound (i.e., Due to illness or injury, pt requires aid of supportive devices such as crutches, cane, wheelchairs, walkers, the use of special transportation or the assistance of another person to leave their place of residence. There is a normal inability to leave the home and doing so requires considerable and taxing effort. Other absences are for medical reasons / religious services and are infrequent or of short duration when for other reasons). If current dressing causes regression in wound condition, may D/C ordered dressing product/s and apply Normal Saline Moist Dressing daily until next [place] / Other MD appointment. [place] of regression in wound condition at [phone]. Please direct any NON-WOUND related issues/requests for orders to patient's Primary Care Physician Wound #4 Medial Coccyx: [place] Nurse may visit PRN to address patient s wound care needs. FACE TO FACE ENCOUNTER: MEDICARE and MEDICAID PATIENTS: I certify that this patient is under my care and that I had a face-to-face encounter that meets the physician face-to-face encounter requirements with this patient on this date. The encounter with the patient was in whole or in part for the following MEDICAL CONDITION: (primary reason for [place]) MEDICAL NECESSITY: I certify, that based on my findings, NURSING services are a medically necessary home health service. HOME BOUND STATUS: I certify that my clinical findings support that this patient  is homebound (i.e., Due to illness or injury, pt requires aid of supportive devices such as crutches, cane, wheelchairs, walkers, the use [place], [place]. ([id number]) of special transportation or the assistance of another person to leave their place of residence. There is a normal inability to leave the home and doing so requires considerable and taxing effort. Other absences are for medical reasons / religious services and are infrequent or of short duration when for other reasons). If current dressing causes regression in wound condition, may D/C ordered dressing product/s and apply Normal Saline Moist Dressing daily until next [place] / Other MD appointment. [place] of regression in wound condition at [phone]. Please direct any NON-WOUND related issues/requests for orders to patient's Primary Care Physician Wound #7 Right Toe Second: [place] Nurse may visit PRN to address patient s wound care needs. FACE TO FACE ENCOUNTER: MEDICARE and MEDICAID PATIENTS: I certify that this patient is under my care and that I had a face-to-face encounter that meets the physician face-to-face encounter requirements with this patient on this date. The encounter with the patient was in whole or in part for the following MEDICAL CONDITION: (primary reason for [place]) MEDICAL NECESSITY: I certify, that based on my findings, NURSING services are a medically necessary home health service. HOME BOUND STATUS: I certify that my clinical findings support that this patient is homebound (i.e., Due to illness or injury, pt requires aid of supportive devices such as crutches, cane, wheelchairs, walkers, the use of special transportation or the assistance of another person to leave their place of residence. There is a normal inability to leave the home and doing so requires considerable and taxing effort. Other absences are for  medical reasons / religious services  and are infrequent or of short duration when for other reasons). If current dressing causes regression in wound condition, may D/C ordered dressing product/s and apply Normal Saline Moist Dressing daily until next [place] / Other MD appointment. [place] of regression in wound condition at [phone]. Please direct any NON-WOUND related issues/requests for orders to patient's Primary Care Physician Follow-Up Appointments: A follow-up appointment should be scheduled. Electronic Signature(s) Signed: 05/16/2015 4:25:33 PM By: Lorine Bears RCP, RRT, CHT Previous Signature: 05/04/2015 9:34:51 AM Version By: Judene Companion MD Entered By: Lorine Bears on 05/16/2015 16:25:32 [name] ([id number]) -------------------------------------------------------------------------------- SuperBill Details Patient Name: [name] Date of Service: 05/04/2015 Medical Record Number: [id number] Patient Account Number: [id number] Date of Birth/Sex: December 02, 1955 (59 y.o. Female) Treating RN: Primary Care Physician: [name] Other Clinician: Referring Physician: [name] Treating Physician/Extender: Benjaman Pott in Treatment: 16 Diagnosis Coding ICD-10 Codes Code Description E11.621 Type 2 diabetes mellitus with foot ulcer L97.422 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 layer exposed L97.522 Non-pressure chronic ulcer of other part of left foot with fat layer exposed [id number] Chronic systolic (congestive) heart failure E11.42 Type 2 diabetes mellitus with diabetic polyneuropathy S00.33XA Contusion of nose, initial encounter 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, 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, stage 3 L89.323 Pressure ulcer of left buttock, stage 3 L97.512 Non-pressure chronic ulcer of other part of right foot with fat layer  exposed L02.611 Cutaneous abscess of right foot Facility Procedures CPT4 Code: [id number] Description: 11042 - DEB SUBQ TISSUE 20 SQ CM/< ICD-10 Description Diagnosis 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, stage 3 Modifier: Quantity: 1 Physician Procedures CPT4 Code: [id number] Description: 99213 - WC PHYS LEVEL 3 - EST PT ICD-10 Description Diagnosis E11.621 Type 2 diabetes mellitus with foot ulcer Modifier: Quantity: 1 CPT4 Code: [id number] Dannielle Karvonen, [place] Description: 11042 - WC PHYS SUBQ TISS 20 SQ CM ICD-10 Description Diagnosis 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, stage 3 S. ([id number]) Modifier: Quantity: 1 Electronic Signature(s) Signed: 05/04/2015 9:36:41 AM By: Judene Companion MD Entered By: Judene Companion on 05/04/2015 09:36:40

## 2015-05-05 ENCOUNTER — Ambulatory Visit
Admission: RE | Admit: 2015-05-05 | Discharge: 2015-05-05 | Disposition: A | Discharge status: Home / Self Care | Payer: PPO | Source: Ambulatory Visit | Attending: Family Medicine | Admitting: Family Medicine

## 2015-05-05 DIAGNOSIS — N289 Disorder of kidney and ureter, unspecified: Secondary | ICD-10-CM | POA: Insufficient documentation

## 2015-05-05 NOTE — Progress Notes (Signed)
Temperature / Pain Moisture Temperature: No Abnormality No Abnormalities Noted: No Dry / Scaly: No Maceration: No Moist: No Wound Preparation Ulcer Cleansing: Rinsed/Irrigated with Saline Topical Anesthetic Applied: None Treatment Notes Wound #7 (Right Toe Second) 1. Cleansed with: Clean wound with Normal Saline 2. Anesthetic Topical Lidocaine 4% cream to wound bed prior to debridement 4. Dressing Applied: Aquacel Ag 5. Secondary Dressing Applied Gauze and Kerlix/Conform 7. Secured with Architect) Signed: 05/04/2015 5:11:52 PM By: Montey Hora Entered By: Montey Hora on 05/04/2015 09:21:11 [name] ([id number]) -------------------------------------------------------------------------------- [place] Details Patient Name: [name] Date of Service: 05/04/2015 8:45 AM Medical Record Number: [id number] Patient Account Number: [id number] Date of Birth/Sex: [birth year] (59 y.o. Female) Treating RN: Montey Hora Primary Care Physician: [name] Other Clinician: Referring Physician: [name] Treating Physician/Extender: Benjaman Pott in Treatment: 16 Vital Signs Time Taken: 09:04 Temperature (F): 97.9 Height (in): 68 Pulse (bpm): 77 Weight (lbs): 257 Respiratory Rate (breaths/min): 18 Body Mass Index (BMI): 39.1 Blood Pressure (mmHg): 119/71 Capillary Blood Glucose (mg/dl): 163 Reference Range: 80 - 120 mg / dl Electronic Signature(s) Signed: 05/05/2015 8:42:46 AM By: Gretta Cool, RN, BSN, Kim RN, BSN Entered By: Gretta Cool, RN, BSN, Kim on 05/04/2015 [id number]  QUISHA, DUPLANTIS ([id number]) Visit Report for 05/04/2015 Arrival Information Details Patient Name: [name] Date of Service: 05/04/2015 8:45 AM Medical Record Number: [id number] Patient Account Number: [id number] Date of Birth/Sex: [birth year] (59 y.o. Female) Treating RN: Montey Hora Primary Care Physician: [name] Other Clinician: Referring Physician: [name] Treating Physician/Extender: Benjaman Pott in Treatment: 16 Visit Information History Since Last Visit Added or deleted any medications: No Patient Arrived: Wheel Chair Any new allergies or adverse reactions: No Arrival Time: 09:06 Had a fall or experienced change in No Accompanied By: spouse activities of daily living that may affect Transfer Assistance: Manual risk of falls: Patient Identification Verified: Yes Signs or symptoms of abuse/neglect since last No Secondary Verification Process Yes visito Completed: Hospitalized since last visit: No Patient Requires Transmission- No Pain Present Now: No Based Precautions: Patient Has Alerts: Yes Patient Alerts: Patient on Blood Thinner ABI L:0.8, R:0.93 Electronic Signature(s) Signed: 05/04/2015 9:19:31 AM By: Judene Companion MD Entered By: Judene Companion on 05/04/2015 09:19:31 [name] ([id number]) -------------------------------------------------------------------------------- Encounter Discharge Information Details Patient Name: [name] Date of Service: 05/04/2015 8:45 AM Medical Record Number: [id number] Patient Account Number: [id number] Date of Birth/Sex: 1955-10-25 (59 y.o. Female) Treating RN: Primary Care Physician: [name] Other Clinician: Referring Physician: [name] Treating Physician/Extender: Benjaman Pott in Treatment: 2 Encounter Discharge Information Items Discharge Pain Level: 0 Discharge Condition: Stable Ambulatory Status: Wheelchair Discharge Destination:  Home Private Transportation: Auto Accompanied By: spouse Schedule Follow-up Appointment: Yes Medication Reconciliation completed and No provided to Patient/Care [name]: Clinical Summary of Care: Electronic Signature(s) Signed: 05/04/2015 5:11:52 PM By: Montey Hora Previous Signature: 05/04/2015 9:37:21 AM Version By: Judene Companion MD Entered By: Montey Hora on 05/04/2015 09:45:11 [name] ([id number]) -------------------------------------------------------------------------------- Lower Extremity Assessment Details Patient Name: [name] Date of Service: 05/04/2015 8:45 AM Medical Record Number: [id number] Patient Account Number: [id number] Date of Birth/Sex: [birth year] (59 y.o. Female) Treating RN: Montey Hora Primary Care Physician: [name] Other Clinician: Referring Physician: [name] Treating Physician/Extender: Benjaman Pott in Treatment: 16 Vascular Assessment Pulses: Posterior Tibial Dorsalis Pedis Palpable: [Right:Yes] Extremity colors, hair growth, and conditions: Extremity Color: [Right:Normal] Hair Growth on Extremity: [Right:Yes] Temperature of Extremity: [Right:Warm] Capillary Refill: [Right:< 3 seconds] Toe Nail Assessment Left: Right: Thick: No Discolored: No Deformed: No Improper Length and Hygiene: No Electronic Signature(s) Signed: 05/04/2015 5:11:52 PM By: Montey Hora Signed: 05/05/2015 8:42:46 AM By: Gretta Cool RN, BSN, Kim RN, BSN Entered By: Gretta Cool, RN, BSN, Kim on 05/04/2015 09:08:57 [name] ([id number]) -------------------------------------------------------------------------------- Multi Wound Chart Details Patient Name: [name] Date of Service: 05/04/2015 8:45 AM Medical Record Number: [id number] Patient Account Number: [id number] Date of Birth/Sex: 10-Aug-1956 (59 y.o. 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] [N/A:N/A] Date Acquired: [3:11/07/2014] [7:03/10/2015] [N/A:N/A] Weeks of Treatment: [3:15] [7:7] [N/A:N/A] Wound Status: [3:Open] [7:Open] [N/A:N/A] Measurements L x W x D 0.6x0.5x0.1 [7:0.5x0.7x0.4] [N/A:N/A] (cm) Area (cm) : [3:0.236] [7:0.275] [N/A:N/A] Volume (cm) : [3:0.024] [7:0.11] [N/A:N/A] % Reduction in Area: [3:57.10%] [7:66.30%] [N/A:N/A] % Reduction in Volume: 78.20% [7:-34.10%] [N/A:N/A] Classification: [  QUISHA, DUPLANTIS ([id number]) Visit Report for 05/04/2015 Arrival Information Details Patient Name: [name] Date of Service: 05/04/2015 8:45 AM Medical Record Number: [id number] Patient Account Number: [id number] Date of Birth/Sex: [birth year] (59 y.o. Female) Treating RN: Montey Hora Primary Care Physician: [name] Other Clinician: Referring Physician: [name] Treating Physician/Extender: Benjaman Pott in Treatment: 16 Visit Information History Since Last Visit Added or deleted any medications: No Patient Arrived: Wheel Chair Any new allergies or adverse reactions: No Arrival Time: 09:06 Had a fall or experienced change in No Accompanied By: spouse activities of daily living that may affect Transfer Assistance: Manual risk of falls: Patient Identification Verified: Yes Signs or symptoms of abuse/neglect since last No Secondary Verification Process Yes visito Completed: Hospitalized since last visit: No Patient Requires Transmission- No Pain Present Now: No Based Precautions: Patient Has Alerts: Yes Patient Alerts: Patient on Blood Thinner ABI L:0.8, R:0.93 Electronic Signature(s) Signed: 05/04/2015 9:19:31 AM By: Judene Companion MD Entered By: Judene Companion on 05/04/2015 09:19:31 [name] ([id number]) -------------------------------------------------------------------------------- Encounter Discharge Information Details Patient Name: [name] Date of Service: 05/04/2015 8:45 AM Medical Record Number: [id number] Patient Account Number: [id number] Date of Birth/Sex: 1955-10-25 (59 y.o. Female) Treating RN: Primary Care Physician: [name] Other Clinician: Referring Physician: [name] Treating Physician/Extender: Benjaman Pott in Treatment: 2 Encounter Discharge Information Items Discharge Pain Level: 0 Discharge Condition: Stable Ambulatory Status: Wheelchair Discharge Destination:  Home Private Transportation: Auto Accompanied By: spouse Schedule Follow-up Appointment: Yes Medication Reconciliation completed and No provided to Patient/Care [name]: Clinical Summary of Care: Electronic Signature(s) Signed: 05/04/2015 5:11:52 PM By: Montey Hora Previous Signature: 05/04/2015 9:37:21 AM Version By: Judene Companion MD Entered By: Montey Hora on 05/04/2015 09:45:11 [name] ([id number]) -------------------------------------------------------------------------------- Lower Extremity Assessment Details Patient Name: [name] Date of Service: 05/04/2015 8:45 AM Medical Record Number: [id number] Patient Account Number: [id number] Date of Birth/Sex: [birth year] (59 y.o. Female) Treating RN: Montey Hora Primary Care Physician: [name] Other Clinician: Referring Physician: [name] Treating Physician/Extender: Benjaman Pott in Treatment: 16 Vascular Assessment Pulses: Posterior Tibial Dorsalis Pedis Palpable: [Right:Yes] Extremity colors, hair growth, and conditions: Extremity Color: [Right:Normal] Hair Growth on Extremity: [Right:Yes] Temperature of Extremity: [Right:Warm] Capillary Refill: [Right:< 3 seconds] Toe Nail Assessment Left: Right: Thick: No Discolored: No Deformed: No Improper Length and Hygiene: No Electronic Signature(s) Signed: 05/04/2015 5:11:52 PM By: Montey Hora Signed: 05/05/2015 8:42:46 AM By: Gretta Cool RN, BSN, Kim RN, BSN Entered By: Gretta Cool, RN, BSN, Kim on 05/04/2015 09:08:57 [name] ([id number]) -------------------------------------------------------------------------------- Multi Wound Chart Details Patient Name: [name] Date of Service: 05/04/2015 8:45 AM Medical Record Number: [id number] Patient Account Number: [id number] Date of Birth/Sex: 10-Aug-1956 (59 y.o. 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] [N/A:N/A] Date Acquired: [3:11/07/2014] [7:03/10/2015] [N/A:N/A] Weeks of Treatment: [3:15] [7:7] [N/A:N/A] Wound Status: [3:Open] [7:Open] [N/A:N/A] Measurements L x W x D 0.6x0.5x0.1 [7:0.5x0.7x0.4] [N/A:N/A] (cm) Area (cm) : [3:0.236] [7:0.275] [N/A:N/A] Volume (cm) : [3:0.024] [7:0.11] [N/A:N/A] % Reduction in Area: [3:57.10%] [7:66.30%] [N/A:N/A] % Reduction in Volume: 78.20% [7:-34.10%] [N/A:N/A] Classification: [  QUISHA, DUPLANTIS ([id number]) Visit Report for 05/04/2015 Arrival Information Details Patient Name: [name] Date of Service: 05/04/2015 8:45 AM Medical Record Number: [id number] Patient Account Number: [id number] Date of Birth/Sex: [birth year] (59 y.o. Female) Treating RN: Montey Hora Primary Care Physician: [name] Other Clinician: Referring Physician: [name] Treating Physician/Extender: Benjaman Pott in Treatment: 16 Visit Information History Since Last Visit Added or deleted any medications: No Patient Arrived: Wheel Chair Any new allergies or adverse reactions: No Arrival Time: 09:06 Had a fall or experienced change in No Accompanied By: spouse activities of daily living that may affect Transfer Assistance: Manual risk of falls: Patient Identification Verified: Yes Signs or symptoms of abuse/neglect since last No Secondary Verification Process Yes visito Completed: Hospitalized since last visit: No Patient Requires Transmission- No Pain Present Now: No Based Precautions: Patient Has Alerts: Yes Patient Alerts: Patient on Blood Thinner ABI L:0.8, R:0.93 Electronic Signature(s) Signed: 05/04/2015 9:19:31 AM By: Judene Companion MD Entered By: Judene Companion on 05/04/2015 09:19:31 [name] ([id number]) -------------------------------------------------------------------------------- Encounter Discharge Information Details Patient Name: [name] Date of Service: 05/04/2015 8:45 AM Medical Record Number: [id number] Patient Account Number: [id number] Date of Birth/Sex: 1955-10-25 (59 y.o. Female) Treating RN: Primary Care Physician: [name] Other Clinician: Referring Physician: [name] Treating Physician/Extender: Benjaman Pott in Treatment: 2 Encounter Discharge Information Items Discharge Pain Level: 0 Discharge Condition: Stable Ambulatory Status: Wheelchair Discharge Destination:  Home Private Transportation: Auto Accompanied By: spouse Schedule Follow-up Appointment: Yes Medication Reconciliation completed and No provided to Patient/Care [name]: Clinical Summary of Care: Electronic Signature(s) Signed: 05/04/2015 5:11:52 PM By: Montey Hora Previous Signature: 05/04/2015 9:37:21 AM Version By: Judene Companion MD Entered By: Montey Hora on 05/04/2015 09:45:11 [name] ([id number]) -------------------------------------------------------------------------------- Lower Extremity Assessment Details Patient Name: [name] Date of Service: 05/04/2015 8:45 AM Medical Record Number: [id number] Patient Account Number: [id number] Date of Birth/Sex: [birth year] (59 y.o. Female) Treating RN: Montey Hora Primary Care Physician: [name] Other Clinician: Referring Physician: [name] Treating Physician/Extender: Benjaman Pott in Treatment: 16 Vascular Assessment Pulses: Posterior Tibial Dorsalis Pedis Palpable: [Right:Yes] Extremity colors, hair growth, and conditions: Extremity Color: [Right:Normal] Hair Growth on Extremity: [Right:Yes] Temperature of Extremity: [Right:Warm] Capillary Refill: [Right:< 3 seconds] Toe Nail Assessment Left: Right: Thick: No Discolored: No Deformed: No Improper Length and Hygiene: No Electronic Signature(s) Signed: 05/04/2015 5:11:52 PM By: Montey Hora Signed: 05/05/2015 8:42:46 AM By: Gretta Cool RN, BSN, Kim RN, BSN Entered By: Gretta Cool, RN, BSN, Kim on 05/04/2015 09:08:57 [name] ([id number]) -------------------------------------------------------------------------------- Multi Wound Chart Details Patient Name: [name] Date of Service: 05/04/2015 8:45 AM Medical Record Number: [id number] Patient Account Number: [id number] Date of Birth/Sex: 10-Aug-1956 (59 y.o. 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] [N/A:N/A] Date Acquired: [3:11/07/2014] [7:03/10/2015] [N/A:N/A] Weeks of Treatment: [3:15] [7:7] [N/A:N/A] Wound Status: [3:Open] [7:Open] [N/A:N/A] Measurements L x W x D 0.6x0.5x0.1 [7:0.5x0.7x0.4] [N/A:N/A] (cm) Area (cm) : [3:0.236] [7:0.275] [N/A:N/A] Volume (cm) : [3:0.024] [7:0.11] [N/A:N/A] % Reduction in Area: [3:57.10%] [7:66.30%] [N/A:N/A] % Reduction in Volume: 78.20% [7:-34.10%] [N/A:N/A] Classification: [  QUISHA, DUPLANTIS ([id number]) Visit Report for 05/04/2015 Arrival Information Details Patient Name: [name] Date of Service: 05/04/2015 8:45 AM Medical Record Number: [id number] Patient Account Number: [id number] Date of Birth/Sex: [birth year] (59 y.o. Female) Treating RN: Montey Hora Primary Care Physician: [name] Other Clinician: Referring Physician: [name] Treating Physician/Extender: Benjaman Pott in Treatment: 16 Visit Information History Since Last Visit Added or deleted any medications: No Patient Arrived: Wheel Chair Any new allergies or adverse reactions: No Arrival Time: 09:06 Had a fall or experienced change in No Accompanied By: spouse activities of daily living that may affect Transfer Assistance: Manual risk of falls: Patient Identification Verified: Yes Signs or symptoms of abuse/neglect since last No Secondary Verification Process Yes visito Completed: Hospitalized since last visit: No Patient Requires Transmission- No Pain Present Now: No Based Precautions: Patient Has Alerts: Yes Patient Alerts: Patient on Blood Thinner ABI L:0.8, R:0.93 Electronic Signature(s) Signed: 05/04/2015 9:19:31 AM By: Judene Companion MD Entered By: Judene Companion on 05/04/2015 09:19:31 [name] ([id number]) -------------------------------------------------------------------------------- Encounter Discharge Information Details Patient Name: [name] Date of Service: 05/04/2015 8:45 AM Medical Record Number: [id number] Patient Account Number: [id number] Date of Birth/Sex: 1955-10-25 (59 y.o. Female) Treating RN: Primary Care Physician: [name] Other Clinician: Referring Physician: [name] Treating Physician/Extender: Benjaman Pott in Treatment: 2 Encounter Discharge Information Items Discharge Pain Level: 0 Discharge Condition: Stable Ambulatory Status: Wheelchair Discharge Destination:  Home Private Transportation: Auto Accompanied By: spouse Schedule Follow-up Appointment: Yes Medication Reconciliation completed and No provided to Patient/Care [name]: Clinical Summary of Care: Electronic Signature(s) Signed: 05/04/2015 5:11:52 PM By: Montey Hora Previous Signature: 05/04/2015 9:37:21 AM Version By: Judene Companion MD Entered By: Montey Hora on 05/04/2015 09:45:11 [name] ([id number]) -------------------------------------------------------------------------------- Lower Extremity Assessment Details Patient Name: [name] Date of Service: 05/04/2015 8:45 AM Medical Record Number: [id number] Patient Account Number: [id number] Date of Birth/Sex: [birth year] (59 y.o. Female) Treating RN: Montey Hora Primary Care Physician: [name] Other Clinician: Referring Physician: [name] Treating Physician/Extender: Benjaman Pott in Treatment: 16 Vascular Assessment Pulses: Posterior Tibial Dorsalis Pedis Palpable: [Right:Yes] Extremity colors, hair growth, and conditions: Extremity Color: [Right:Normal] Hair Growth on Extremity: [Right:Yes] Temperature of Extremity: [Right:Warm] Capillary Refill: [Right:< 3 seconds] Toe Nail Assessment Left: Right: Thick: No Discolored: No Deformed: No Improper Length and Hygiene: No Electronic Signature(s) Signed: 05/04/2015 5:11:52 PM By: Montey Hora Signed: 05/05/2015 8:42:46 AM By: Gretta Cool RN, BSN, Kim RN, BSN Entered By: Gretta Cool, RN, BSN, Kim on 05/04/2015 09:08:57 [name] ([id number]) -------------------------------------------------------------------------------- Multi Wound Chart Details Patient Name: [name] Date of Service: 05/04/2015 8:45 AM Medical Record Number: [id number] Patient Account Number: [id number] Date of Birth/Sex: 10-Aug-1956 (59 y.o. 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

## 2015-05-05 NOTE — Progress Notes (Signed)
[name], [name] ([id number]) Visit Report for 05/04/2015 HBO Details Patient Name: [name], [name] Date of Service: 05/04/2015 10:00 AM Medical Record Number: [id number] Patient Account Number: [id number] Date of Birth/Sex: 1956-05-04 (59 y.o. 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 HBO Treatment Details Treatment Number: 15 Patient Type: Outpatient Chamber Type: Monoplace Chamber #: HBO [id number] Treatment Protocol: 2.0 ATA with 90 minutes oxygen, and no air breaks Treatment Details Compression Rate Down: 1.5 psi / minute De-Compression Rate Up: 1.5 psi / minute Air breaks and breathing Compress Tx Pressure Decompress Decompress periods Begins Reached Begins Ends (leave unused spaces blank) Chamber Pressure 1 ATA 2.0 ATA - - - - - - 2.0 ATA 1 ATA Clock Time (24 hr) 09:53 10:03 - - - - - - 11:33 11:43 Treatment Length: 110 (minutes) Treatment Segments: 4 Capillary Blood Glucose Pre Capillary Blood Glucose (mg/dl): Post Capillary Blood Glucose (mg/dl): Vital Signs Capillary Blood Glucose Reference Range: 80 - 120 mg / dl HBO Diabetic Blood Glucose Intervention Range: <131 mg/dl or >249 mg/dl Time Vitals Blood Respiratory Capillary Blood Glucose Pulse Action Type: Pulse: Temperature: Taken: Pressure: Rate: Glucose (mg/dl): Meter #: Oximetry (%) Taken: Pre 09:04 119/71 77 18 97.9 163 1 none Post 11:48 112/60 78 18 97.9 147 1 none Treatment Response Well [name], [name] ([id number]) Treatment Toleration: Treatment Treatment Completed without Adverse Event Completion  Status: Electronic Signature(s) Signed: 05/04/2015 3:04:29 PM By: Lorine Bears RCP, RRT, CHT Entered By: Lorine Bears on 05/04/2015 12:00:17 [name] ([id number]) -------------------------------------------------------------------------------- HBO Safety Checklist Details Patient Name: [name] Date of Service: 05/04/2015 10:00 AM Medical Record Number: [id number] Patient Account Number: [id number] Date of Birth/Sex: 29-Oct-1955 (59 y.o. Female) Treating RN: Primary Care Physician: [name] Other Clinician: Jacqulyn Bath Referring Physician: [name] Treating Physician/Extender: Benjaman Pott in Treatment: 16 HBO Safety Checklist Items Safety Checklist Consent Form Signed Patient voided / foley secured and emptied When did you last eato 20:30 pm on 05/03/15 Last dose of injectable or oral agent 08:45 am Ostomy pouch emptied and vented if applicable n/a All implantable devices assessed, documented and approved ICD Intravenous access site secured and place n/a Valuables secured Linens and cotton and cotton/polyester blend (less than 51% polyester) Personal oil-based products / skin lotions / body lotions removed Wigs or hairpieces removed Smoking or tobacco materials removed Books / newspapers / magazines / loose paper removed Cologne, aftershave, perfume and deodorant removed Jewelry removed (may wrap wedding band) Make-up removed Hair care products removed Battery operated devices (external) removed Heating patches and chemical warmers removed Titanium eyewear removed n/a Nail polish cured greater than 10 hours Casting material cured greater than 10 hours n/a Hearing aids removed n/a Loose dentures or partials removed n/a Prosthetics have been removed n/a Patient demonstrates correct use of air break device (if applicable) Patient concerns have been addressed Patient grounding bracelet on and cord attached  to chamber Specifics for Inpatients (complete in addition to above) Medication sheet sent with patient Intravenous medications needed or due during therapy sent with patient [name], [name] ([id number]) Drainage tubes (e.g. nasogastric tube or chest tube secured and vented) Endotracheal or Tracheotomy tube secured Cuff deflated of air and inflated with saline Airway suctioned  Electronic Signature(s) Signed: 05/04/2015 3:04:29 PM By: Lorine Bears RCP, RRT, CHT Entered By: Lorine Bears on 05/04/2015 10:01:30

## 2015-05-05 NOTE — Progress Notes (Signed)
ZARYIAH, BENDURE ([id number]) Visit Report for 05/04/2015 Arrival Information Details Patient Name: [name], [name] Date of Service: 05/04/2015 10:00 AM Medical Record Number: [id number] Patient Account Number: [id number] Date of Birth/Sex: 1955/10/27 (59 y.o. Female) Treating RN: Primary Care Physician: [name] Other Clinician: Jacqulyn Bath Referring Physician: [name] Treating Physician/Extender: Benjaman Pott in Treatment: 1 Visit Information History Since Last Visit Added or deleted any medications: No Patient Arrived: Wheel Chair Any new allergies or adverse reactions: No Arrival Time: 09:45 Had a fall or experienced change in No Accompanied By: husband activities of daily living that may affect Transfer Assistance: Manual risk of falls: Patient Identification Verified: Yes Signs or symptoms of abuse/neglect No Secondary Verification Process Yes since last visito Completed: Hospitalized since last visit: No Patient Requires Transmission- No Has Dressing in Place as Prescribed: Yes Based Precautions: Has Footwear/Offloading in Place as Yes Patient Has Alerts: Yes Prescribed: Patient Alerts: Patient on Blood Left: Surgical Shoe with Thinner Pressure Relief ABI L:0.8, Insole R:0.93 Right: Surgical Shoe with Pressure Relief Insole Pain Present Now: No Electronic Signature(s) Signed: 05/04/2015 3:04:29 PM By: Lorine Bears RCP, RRT, CHT Entered By: Lorine Bears on 05/04/2015 09:59:25 [name] ([id number]) -------------------------------------------------------------------------------- Encounter Discharge Information Details Patient Name: [name] Date of Service: 05/04/2015 10:00 AM Medical Record Number: [id number] Patient Account Number: [id number] Date of Birth/Sex: [birth year] (59 y.o. Female) Treating RN: Primary Care Physician: [name] Other Clinician: Jacqulyn Bath Referring  Physician: [name] Treating Physician/Extender: Benjaman Pott in Treatment: 57 Encounter Discharge Information Items Discharge Pain Level: 0 Discharge Condition: Stable Ambulatory Status: Wheelchair Discharge Destination: Home Private Transportation: Auto Accompanied By: husband Schedule Follow-up Appointment: No Medication Reconciliation completed and No provided to Patient/Care Syann Cupples: Clinical Summary of Care: Notes Patient has an HBO treatment scheduled on 05/08/15 at 10:00 am. Electronic Signature(s) Signed: 05/04/2015 3:04:29 PM By: Lorine Bears RCP, RRT, CHT Entered By: Lorine Bears on 05/04/2015 12:00:59 [name] ([id number]) -------------------------------------------------------------------------------- Vitals Details Patient Name: [name] Date of Service: 05/04/2015 10:00 AM Medical Record Number: [id number] Patient Account Number: [id number] Date of Birth/Sex: [birth year] (59 y.o. Female) Treating RN: Primary Care Physician: [name] Other Clinician: Jacqulyn Bath Referring Physician: [name] Treating Physician/Extender: Benjaman Pott in Treatment: 16 Vital Signs Time Taken: 09:04 Temperature (F): 97.9 Height (in): 68 Pulse (bpm): 77 Weight (lbs): 257 Respiratory Rate (breaths/min): 18 Body Mass Index (BMI): 39.1 Blood Pressure (mmHg): 119/71 Capillary Blood Glucose (mg/dl): 163 Reference Range: 80 - 120 mg / dl Electronic Signature(s) Signed: 05/04/2015 3:04:29 PM By: Lorine Bears RCP, RRT, CHT Entered By: Becky Sax, Amado Nash on 05/04/2015 09:59:59

## 2015-05-08 ENCOUNTER — Encounter: Payer: PPO | Attending: Surgery | Admitting: Surgery

## 2015-05-08 DIAGNOSIS — E1142 Type 2 diabetes mellitus with diabetic polyneuropathy: Secondary | ICD-10-CM | POA: Diagnosis not present

## 2015-05-08 DIAGNOSIS — M86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, left ankle and foot: Secondary | ICD-10-CM | POA: Diagnosis not present

## 2015-05-08 DIAGNOSIS — L97512 Non-pressure chronic ulcer of other part of right foot with fat layer exposed: Secondary | ICD-10-CM | POA: Diagnosis present

## 2015-05-08 DIAGNOSIS — I5022 Chronic systolic (congestive) heart failure: Secondary | ICD-10-CM | POA: Insufficient documentation

## 2015-05-08 DIAGNOSIS — E11621 Type 2 diabetes mellitus with foot ulcer: Secondary | ICD-10-CM | POA: Insufficient documentation

## 2015-05-08 LAB — GLUCOSE, CAPILLARY
GLUCOSE-CAPILLARY: 175 mg/dL — AB (ref 65–99)
Glucose-Capillary: 136 mg/dL — ABNORMAL HIGH (ref 65–99)

## 2015-05-08 NOTE — Progress Notes (Signed)
[name], [name] ([id number]) Visit Report for 05/08/2015 Arrival Information Details Patient Name: [name], [name] Date of Service: 05/08/2015 10:00 AM Medical Record Number: [id number] Patient Account Number: [id number] Date of Birth/Sex: [birth year] (59 y.o. Female) Treating RN: Primary Care Physician: [name] Other Clinician: Jacqulyn Bath Referring Physician: [name] Treating Physician/Extender: Frann Rider in Treatment: 97 Visit Information History Since Last Visit Added or deleted any medications: No Patient Arrived: Wheel Chair Had a fall or experienced change in No Arrival Time: 09:35 activities of daily living that may affect Accompanied By: husband risk of falls: Transfer Assistance: Manual Hospitalized since last visit: No Patient Identification Verified: Yes Has Footwear/Offloading in Place as Yes Secondary Verification Process Yes Prescribed: Completed: Left: Surgical Shoe with Patient Requires Transmission- No Pressure Relief Insole Based Precautions: Right: Surgical Shoe with Patient Has Alerts: Yes Pressure Relief Insole Patient Alerts: Patient on Blood Pain Present Now: No Thinner ABI L:0.8, R:0.93 Electronic Signature(s) Signed: 05/08/2015 3:40:05 PM By: Lorine Bears RCP, RRT, CHT Entered By: Lorine Bears on 05/08/2015 09:12:55 [name] ([id number]) -------------------------------------------------------------------------------- Encounter Discharge Information Details Patient Name: [name] Date of Service: 05/08/2015 10:00 AM Medical Record Number: [id number] Patient Account Number: [id number] Date of Birth/Sex: 02-Jan-1956 (59 y.o. Female) Treating RN: Primary Care Physician: [name] Other Clinician: Jacqulyn Bath Referring Physician: [name] Treating Physician/Extender: Frann Rider in Treatment: 33 Encounter Discharge Information Items Discharge Pain  Level: 0 Discharge Condition: Stable Ambulatory Status: Wheelchair Discharge Destination: Home Private Transportation: Auto Accompanied By: husband Schedule Follow-up Appointment: No Medication Reconciliation completed and No provided to Patient/Care Broxton Broady: Clinical Summary of Care: Notes Patient has an HBO treatment scheduled on 05/09/15 at 10:00 am. Electronic Signature(s) Signed: 05/08/2015 3:40:05 PM By: Lorine Bears RCP, RRT, CHT Entered By: Lorine Bears on 05/08/2015 11:10:03 [name] ([id number]) -------------------------------------------------------------------------------- Vitals Details Patient Name: [name] Date of Service: 05/08/2015 10:00 AM Medical Record Number: [id number] Patient Account Number: [id number] Date of Birth/Sex: Aug 15, 1956 (59 y.o. Female) Treating RN: Primary Care Physician: [name] Other Clinician: Jacqulyn Bath Referring Physician: [name] Treating Physician/Extender: Frann Rider in Treatment: 16 Vital Signs Time Taken: 09:40 Temperature (F): 97.7 Height (in): 68 Pulse (bpm): 72 Weight (lbs): 257 Respiratory Rate (breaths/min): 18 Body Mass Index (BMI): 39.1 Blood Pressure (mmHg): 92/56 Capillary Blood Glucose (mg/dl): 136 Reference Range: 80 - 120 mg / dl Electronic Signature(s) Signed: 05/08/2015 3:40:05 PM By: Lorine Bears RCP, RRT, CHT Entered By: Lorine Bears on 05/08/2015 09:13:32

## 2015-05-08 NOTE — Progress Notes (Signed)
[name], [name] ([id number]) Visit Report for 05/08/2015 HBO Details Patient Name: [name], [name] Date of Service: 05/08/2015 10:00 AM Medical Record Number: [id number] Patient Account Number: [id number] Date of Birth/Sex: 1956/02/24 (59 y.o. 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 HBO Treatment Details Treatment Number: 16 Patient Type: Outpatient Chamber Type: Monoplace Chamber #: HBO [id number] Treatment Protocol: 2.0 ATA with 90 minutes oxygen, and no air breaks Treatment Details Compression Rate Down: 1.5 psi / minute De-Compression Rate Up: 1.5 psi / minute Air breaks and breathing Compress Tx Pressure Decompress Decompress periods Begins Reached Begins Ends (leave unused spaces blank) Chamber Pressure 1 ATA 2.0 ATA - - - - - - 2.0 ATA 1 ATA Clock Time (24 hr) 09:00 09:11 - - - - - - 10:41 10:51 Treatment Length: 111 (minutes) Treatment Segments: 4 Capillary Blood Glucose Pre Capillary Blood Glucose (mg/dl): Post Capillary Blood Glucose (mg/dl): Vital Signs Capillary Blood Glucose Reference Range: 80 - 120 mg / dl HBO Diabetic Blood Glucose Intervention Range: <131 mg/dl or >249 mg/dl Time Vitals Blood Respiratory Capillary Blood Glucose Pulse Action Type: Pulse: Temperature: Taken: Pressure: Rate: Glucose (mg/dl): Meter #: Oximetry (%) Taken: Pre 09:40 92/56 72 18 97.7 136 1 none Post 10:56 128/64 72 18 94.4 175 1 none Treatment Response Well [name], [name] ([id number]) Treatment Toleration: Treatment Treatment Completed without Adverse Event Completion Status: Electronic  Signature(s) Signed: 05/08/2015 12:31:06 PM By: Christin Fudge MD, FACS Signed: 05/08/2015 3:40:05 PM By: Lorine Bears RCP, RRT, CHT Previous Signature: 05/08/2015 11:05:52 AM Version By: Christin Fudge MD, FACS Previous Signature: 05/08/2015 11:05:37 AM Version By: Christin Fudge MD, FACS Entered By: Lorine Bears on 05/08/2015 11:08:43 [name] ([id number]) -------------------------------------------------------------------------------- HBO Safety Checklist Details Patient Name: [name] Date of Service: 05/08/2015 10:00 AM Medical Record Number: [id number] Patient Account Number: [id number] Date of Birth/Sex: 1956-02-17 (59 y.o. Female) Treating RN: Primary Care Physician: [name] Other Clinician: Jacqulyn Bath Referring Physician: [name] Treating Physician/Extender: Frann Rider in Treatment: 16 HBO Safety Checklist Items Safety Checklist Consent Form Signed Patient voided / foley secured and emptied When did you last eato 07:30 pm 05/07/15 Last dose of injectable or oral agent 07:45 am Ostomy pouch emptied and vented if applicable n/a All implantable devices assessed, documented and approved ICD Intravenous access site secured and place n/a Valuables secured Linens and cotton and cotton/polyester blend (less than 51% polyester) Personal oil-based products / skin lotions / body lotions removed Wigs or hairpieces removed Smoking or tobacco materials removed Books / newspapers / magazines / loose paper removed Cologne, aftershave, perfume and deodorant removed Jewelry removed (may wrap wedding band) Make-up removed Hair care products removed Battery operated devices (external) removed Heating patches and chemical warmers removed Titanium eyewear removed n/a Nail polish cured greater than 10 hours n/a Casting material cured greater than 10 hours n/a Hearing aids removed n/a Loose dentures or partials removed  n/a Prosthetics have been removed n/a Patient demonstrates correct use of air break device (if applicable) Patient concerns have been addressed Patient grounding bracelet on and cord attached to chamber Specifics for Inpatients (complete in addition to above) Medication sheet sent with patient Intravenous medications needed or due during therapy sent with  patient [name], [name] ([id number]) Drainage tubes (e.g. nasogastric tube or chest tube secured and vented) Endotracheal or Tracheotomy tube secured Cuff deflated of air and inflated with saline Airway suctioned Electronic Signature(s) Signed: 05/08/2015 3:40:05 PM By: Lorine Bears RCP, RRT, CHT Entered By: Lorine Bears on 05/08/2015 09:15:26

## 2015-05-09 ENCOUNTER — Encounter: Payer: PPO | Admitting: Surgery

## 2015-05-10 ENCOUNTER — Encounter: Payer: PPO | Admitting: Surgery

## 2015-05-11 ENCOUNTER — Encounter: Payer: PPO | Admitting: Surgery

## 2015-05-11 DIAGNOSIS — L97512 Non-pressure chronic ulcer of other part of right foot with fat layer exposed: Secondary | ICD-10-CM | POA: Diagnosis not present

## 2015-05-11 LAB — GLUCOSE, CAPILLARY
GLUCOSE-CAPILLARY: 161 mg/dL — AB (ref 65–99)
Glucose-Capillary: 124 mg/dL — ABNORMAL HIGH (ref 65–99)
Glucose-Capillary: 187 mg/dL — ABNORMAL HIGH (ref 65–99)

## 2015-05-12 ENCOUNTER — Encounter: Payer: PPO | Admitting: Surgery

## 2015-05-12 DIAGNOSIS — L97512 Non-pressure chronic ulcer of other part of right foot with fat layer exposed: Secondary | ICD-10-CM | POA: Diagnosis not present

## 2015-05-12 LAB — OLIGOCLONAL BANDS, CSF + SERM

## 2015-05-12 LAB — GLUCOSE, CAPILLARY
GLUCOSE-CAPILLARY: 141 mg/dL — AB (ref 65–99)
Glucose-Capillary: 160 mg/dL — ABNORMAL HIGH (ref 65–99)

## 2015-05-12 LAB — MYELIN BASIC PROTEIN, CSF: Myelin Basic Protein: <2 mcg/L (ref 0.0–4.0)

## 2015-05-12 NOTE — Progress Notes (Signed)
II Diabetes] [4:Anemia, Arrhythmia, Hypertension, Type II Diabetes] [7:Anemia, Arrhythmia, Hypertension, Type II  Diabetes] Date Acquired: [3:11/07/2014] [4:02/21/2015] [7:03/10/2015] Weeks of Treatment: [3:16] [4:9] [7:8] Wound Status: [3:Open] [4:Open] [7:Open] Measurements L x W x D 0.2x0.2x0.1 [4:0.9x0.2x0.1] [7:0.6x0.7x0.3] (cm) Area (cm) : [3:0.031] [4:0.141] [7:0.33] Volume (cm) : [3:0.003] [4:0.014] [7:0.099] % Reduction in Area: [3:94.40%] [4:97.40%] [7:59.60%] % Reduction in Volume: 97.30% [4:97.40%] [7:-20.70%] Classification: [3:Category/Stage III] [4:Category/Stage III] [7:Grade 1] Exudate Amount: [3:None Present] [4:Medium] [7:Small] Exudate Type: [3:N/A] [4:Serous] [7:Serous] Exudate Color: [3:N/A] [4:amber] [7:amber] Wound Margin: [3:Flat and Intact] [4:Flat and Intact] [7:Flat and Intact] Granulation Amount: [3:Medium (34-66%)] [4:Large (67-100%)] [7:Medium (34-66%)] Granulation Quality: [3:Pink] [4:Red, Pink] [7:Red, Pink] Necrotic Amount: [3:Medium (34-66%)] [4:None Present (0%)] [7:Medium  (34-66%)] Necrotic Tissue: [3:Eschar] [4:N/A] [7:Adherent Slough] Exposed Structures: [3:Fascia: No Fat: No Tendon: No] [4:Fascia: No Fat: No Tendon: No] [7:Fascia: No Fat: No Tendon: No] Muscle: No Muscle: No Muscle: No Joint: No Joint: No Joint: No Bone: No Bone: No Bone: No Limited to Skin Limited to Skin Limited to Skin Breakdown Breakdown Breakdown Epithelialization: Small (1-33%) None None Debridement: N/A N/A Debridement [id number]) Time-Out Taken: N/A N/A Yes Pain Control: N/A N/A Other Tissue Debrided: N/A N/A Fibrin/Slough, Exudates, Callus, Subcutaneous Level: N/A N/A Skin/Subcutaneous Tissue Debridement Area (sq N/A N/A 0.42 cm): Instrument: N/A N/A Curette Bleeding: N/A N/A Minimum Hemostasis Achieved: N/A N/A Pressure Procedural Pain: N/A N/A 0 Post Procedural Pain: N/A N/A 0 Debridement Treatment N/A N/A Procedure was tolerated Response: well Post Debridement N/A N/A 0.6x0.7x0.4 Measurements L x W x D (cm) Post Debridement N/A N/A 0.132 Volume: (cm) Periwound Skin Texture: Edema: No Callus: Yes Edema: No Excoriation: No Edema: No Excoriation: No Induration: No Excoriation: No Induration: No Callus: No Induration: No Callus: No Crepitus: No Crepitus: No Crepitus: No Fluctuance: No Fluctuance: No Fluctuance: No Friable: No Friable: No Friable: No Rash: No Rash: No Rash: No Scarring: No Scarring: No Scarring: No Periwound Skin Maceration: No Maceration: No Maceration: No Moisture: Moist: No Moist: No Moist: No Dry/Scaly: No Dry/Scaly: No Dry/Scaly: No Periwound Skin Color: Atrophie Blanche: No Atrophie Blanche: No Erythema: Yes Cyanosis: No Cyanosis: No Atrophie Blanche: No Ecchymosis: No Ecchymosis: No Cyanosis: No Erythema: No Erythema: No Ecchymosis: No Hemosiderin Staining: No Hemosiderin Staining: No Hemosiderin Staining: No Mottled: No Mottled: No Mottled: No Pallor: No Pallor: No Pallor: No Rubor:  No Rubor: No Rubor: No Temperature: No Abnormality No Abnormality No Abnormality No No No [name], ALWOOD. ([id number]) Tenderness on Palpation: Wound Preparation: Ulcer Cleansing: Ulcer Cleansing: Ulcer Cleansing: Rinsed/Irrigated with Rinsed/Irrigated with Rinsed/Irrigated with Saline Saline Saline Topical Anesthetic Topical Anesthetic Topical Anesthetic Applied: None Applied: Other: lidocaine Applied: Other: lidocaine 4% 4% Assessment Notes: N/A N/A Toe is reddened. Procedures Performed: N/A N/A Debridement Treatment Notes Electronic Signature(s) Signed: 05/11/2015 3:55:58 PM By: Gretta Cool, RN, BSN, Kim RN, BSN Entered By: Gretta Cool, RN, BSN, Kim on 05/11/2015 [id number] [name] ([id number]) -------------------------------------------------------------------------------- [place] Details Patient Name: [name] Date of Service: 05/11/2015 8:45 AM Medical Record Number: [id number] Patient Account Number: [id number] Date of Birth/Sex: Apr 18, 1956 (59 y.o. Female) Treating RN: [name] Primary Care Physician: [name] Other Clinician: Referring Physician: [name] Treating Physician/Extender: Frann Rider in Treatment: 16 Active Inactive Abuse / Safety / Falls / Self Care Management Nursing Diagnoses: Impaired physical mobility Potential for falls Self care deficit: actual or potential Goals: Patient/caregiver will verbalize understanding of skin care regimen Date Initiated: 01/10/2015 Goal Status: Active Patient/caregiver will verbalize/demonstrate measures taken  Pressure Ulcer Etiology: Wound Location: Coccyx - Medial Wound Open Wounding Event: Pressure Injury Status: Date Acquired: 02/21/2015 Comorbid Anemia, Arrhythmia, Congestive Heart Weeks Of Treatment: 9 History: Failure, Hypertension, Type II Diabetes Clustered Wound: No Photos Photo Uploaded By: Gretta Cool, RN, BSN, Kim on 05/11/2015 15:37:02 Wound Measurements Length: (cm) 0.9 Width: (cm) 0.2 Depth: (cm) 0.1 Area: (cm) 0.141 Volume: (cm) 0.014 % Reduction in Area: 97.4% % Reduction in Volume: 97.4% Epithelialization: None Wound Description Classification: Category/Stage III Wound Margin: Flat and Intact Exudate Amount: Medium Exudate Type: Serous Exudate Color: amber Foul Odor After Cleansing: No Wound Bed Granulation Amount: Large (67-100%) Exposed  Structure Granulation Quality: Red, Pink Fascia Exposed: No Necrotic Amount: None Present (0%) Fat Layer Exposed: No Tendon Exposed: No [name], PESNELL. ([id number]) Muscle Exposed: No Joint Exposed: No Bone Exposed: No Limited to Skin Breakdown Periwound Skin Texture Texture Color No Abnormalities Noted: No No Abnormalities Noted: No Callus: Yes Atrophie Blanche: No Crepitus: No Cyanosis: No Excoriation: No Ecchymosis: No Fluctuance: No Erythema: No Friable: No Hemosiderin Staining: No Induration: No Mottled: No Localized Edema: No Pallor: No Rash: No Rubor: No Scarring: No Temperature / Pain Moisture Temperature: No Abnormality No Abnormalities Noted: No Dry / Scaly: No Maceration: No Moist: No Wound Preparation Ulcer Cleansing: Rinsed/Irrigated with Saline Topical Anesthetic Applied: Other: lidocaine 4%, Treatment Notes Wound #4 (Medial Coccyx) 1. Cleansed with: May Shower, gently pat wound dry prior to applying new dressing. 2. Anesthetic Topical Lidocaine 4% cream to wound bed prior to debridement 4. Dressing Applied: Prisma Ag 5. Secondary Dressing Applied Bordered Foam Dressing Gauze and Kerlix/Conform Electronic Signature(s) Signed: 05/11/2015 3:55:58 PM By: Gretta Cool, RN, BSN, Kim RN, BSN Entered By: Gretta Cool, RN, BSN, Kim on 05/11/2015 08:59:13 [name] ([id number]) -------------------------------------------------------------------------------- Wound Assessment Details Patient Name: [name] Date of Service: 05/11/2015 8:45 AM Medical Record Number: [id number] Patient Account Number: [id number] Date of Birth/Sex: 03/15/1956 (59 y.o. Female) Treating RN: [name] Primary Care Physician: [name] Other Clinician: Referring Physician: [name] Treating Physician/Extender: Frann Rider in Treatment: 17 Wound Status Wound Number: 7 Primary Diabetic Wound/Ulcer of the Lower Etiology: Extremity Wound Location:  Right Toe Second Wound Open Wounding Event: Gradually Appeared Status: Date Acquired: 03/10/2015 Comorbid Anemia, Arrhythmia, Congestive Heart Weeks Of Treatment: 8 History: Failure, Hypertension, Type II Diabetes Clustered Wound: No Photos Photo Uploaded By: Gretta Cool, RN, BSN, Kim on 05/11/2015 15:37:26 Wound Measurements Length: (cm) 0.6 Width: (cm) 0.7 Depth: (cm) 0.3 Area: (cm) 0.33 Volume: (cm) 0.099 % Reduction in Area: 59.6% % Reduction in Volume: -20.7% Epithelialization: None Wound Description Classification: Grade 1 Wound Margin: Flat and Intact Exudate Amount: Small Exudate Type: Serous Exudate Color: amber Foul Odor After Cleansing: No Wound Bed Granulation Amount: Medium (34-66%) Exposed Structure Granulation Quality: Red, Pink Fascia Exposed: No Necrotic Amount: Medium (34-66%) Fat Layer Exposed: No Necrotic Quality: Adherent Slough Tendon Exposed: No [name], SALTS. ([id number]) Muscle Exposed: No Joint Exposed: No Bone Exposed: No Limited to Skin Breakdown Periwound Skin Texture Texture Color No Abnormalities Noted: No No Abnormalities Noted: No Callus: No Atrophie Blanche: No Crepitus: No Cyanosis: No Excoriation: No Ecchymosis: No Fluctuance: No Erythema: Yes Friable: No Hemosiderin Staining: No Induration: No Mottled: No Localized Edema: No Pallor: No Rash: No Rubor: No Scarring: No Temperature / Pain Moisture Temperature: No Abnormality No Abnormalities Noted: No Dry / Scaly: No Maceration: No Moist: No Wound Preparation Ulcer Cleansing: Rinsed/Irrigated with Saline Topical Anesthetic Applied: Other: lidocaine 4%, Assessment Notes Toe is reddened. Treatment Notes  II Diabetes] [4:Anemia, Arrhythmia, Hypertension, Type II Diabetes] [7:Anemia, Arrhythmia, Hypertension, Type II  Diabetes] Date Acquired: [3:11/07/2014] [4:02/21/2015] [7:03/10/2015] Weeks of Treatment: [3:16] [4:9] [7:8] Wound Status: [3:Open] [4:Open] [7:Open] Measurements L x W x D 0.2x0.2x0.1 [4:0.9x0.2x0.1] [7:0.6x0.7x0.3] (cm) Area (cm) : [3:0.031] [4:0.141] [7:0.33] Volume (cm) : [3:0.003] [4:0.014] [7:0.099] % Reduction in Area: [3:94.40%] [4:97.40%] [7:59.60%] % Reduction in Volume: 97.30% [4:97.40%] [7:-20.70%] Classification: [3:Category/Stage III] [4:Category/Stage III] [7:Grade 1] Exudate Amount: [3:None Present] [4:Medium] [7:Small] Exudate Type: [3:N/A] [4:Serous] [7:Serous] Exudate Color: [3:N/A] [4:amber] [7:amber] Wound Margin: [3:Flat and Intact] [4:Flat and Intact] [7:Flat and Intact] Granulation Amount: [3:Medium (34-66%)] [4:Large (67-100%)] [7:Medium (34-66%)] Granulation Quality: [3:Pink] [4:Red, Pink] [7:Red, Pink] Necrotic Amount: [3:Medium (34-66%)] [4:None Present (0%)] [7:Medium  (34-66%)] Necrotic Tissue: [3:Eschar] [4:N/A] [7:Adherent Slough] Exposed Structures: [3:Fascia: No Fat: No Tendon: No] [4:Fascia: No Fat: No Tendon: No] [7:Fascia: No Fat: No Tendon: No] Muscle: No Muscle: No Muscle: No Joint: No Joint: No Joint: No Bone: No Bone: No Bone: No Limited to Skin Limited to Skin Limited to Skin Breakdown Breakdown Breakdown Epithelialization: Small (1-33%) None None Debridement: N/A N/A Debridement [id number]) Time-Out Taken: N/A N/A Yes Pain Control: N/A N/A Other Tissue Debrided: N/A N/A Fibrin/Slough, Exudates, Callus, Subcutaneous Level: N/A N/A Skin/Subcutaneous Tissue Debridement Area (sq N/A N/A 0.42 cm): Instrument: N/A N/A Curette Bleeding: N/A N/A Minimum Hemostasis Achieved: N/A N/A Pressure Procedural Pain: N/A N/A 0 Post Procedural Pain: N/A N/A 0 Debridement Treatment N/A N/A Procedure was tolerated Response: well Post Debridement N/A N/A 0.6x0.7x0.4 Measurements L x W x D (cm) Post Debridement N/A N/A 0.132 Volume: (cm) Periwound Skin Texture: Edema: No Callus: Yes Edema: No Excoriation: No Edema: No Excoriation: No Induration: No Excoriation: No Induration: No Callus: No Induration: No Callus: No Crepitus: No Crepitus: No Crepitus: No Fluctuance: No Fluctuance: No Fluctuance: No Friable: No Friable: No Friable: No Rash: No Rash: No Rash: No Scarring: No Scarring: No Scarring: No Periwound Skin Maceration: No Maceration: No Maceration: No Moisture: Moist: No Moist: No Moist: No Dry/Scaly: No Dry/Scaly: No Dry/Scaly: No Periwound Skin Color: Atrophie Blanche: No Atrophie Blanche: No Erythema: Yes Cyanosis: No Cyanosis: No Atrophie Blanche: No Ecchymosis: No Ecchymosis: No Cyanosis: No Erythema: No Erythema: No Ecchymosis: No Hemosiderin Staining: No Hemosiderin Staining: No Hemosiderin Staining: No Mottled: No Mottled: No Mottled: No Pallor: No Pallor: No Pallor: No Rubor:  No Rubor: No Rubor: No Temperature: No Abnormality No Abnormality No Abnormality No No No [name], AL[place]OD. ([id number]) Tenderness on Palpation: Wound Preparation: Ulcer Cleansing: Ulcer Cleansing: Ulcer Cleansing: Rinsed/Irrigated with Rinsed/Irrigated with Rinsed/Irrigated with Saline Saline Saline Topical Anesthetic Topical Anesthetic Topical Anesthetic Applied: None Applied: Other: lidocaine Applied: Other: lidocaine 4% 4% Assessment Notes: N/A N/A Toe is reddened. Procedures Performed: N/A N/A Debridement Treatment Notes Electronic Signature(s) Signed: 05/11/2015 3:55:58 PM By: Gretta Cool, RN, BSN, Kim RN, BSN Entered By: Gretta Cool, RN, BSN, Kim on 05/11/2015 [id number] [name] ([id number]) -------------------------------------------------------------------------------- [place] Details Patient Name: [name] Date of Service: 05/11/2015 8:45 AM Medical Record Number: [id number] Patient Account Number: [id number] Date of Birth/Sex: Apr 18, 1956 (59 y.o. Female) Treating RN: [name] Primary Care Physician: [name] Other Clinician: Referring Physician: [name] Treating Physician/Extender: Frann Rider in Treatment: 16 Active Inactive Abuse / Safety / Falls / Self Care Management Nursing Diagnoses: Impaired physical mobility Potential for falls Self care deficit: actual or potential Goals: Patient/caregiver will verbalize understanding of skin care regimen Date Initiated: 01/10/2015 Goal Status: Active Patient/caregiver will verbalize/demonstrate measures taken  Wound #7 (Right Toe Second) 1. Cleansed with: May Shower, gently pat wound dry prior to applying new dressing. 2. Anesthetic Topical Lidocaine 4% cream to wound bed prior to debridement 4. Dressing Applied: Prisma Ag 5. Secondary Dressing Applied Bordered  Foam Dressing Gauze and Kerlix/Conform Electronic Signature(s) Signed: 05/11/2015 3:55:58 PM By: Gretta Cool, RN, BSN, Kim RN, BSN Entered By: Gretta Cool, RN, BSN, Kim on 05/11/2015 09:03:42 [name] ([id number]) -------------------------------------------------------------------------------- [place] Details Patient Name: [name] Date of Service: 05/11/2015 8:45 AM Medical Record Number: [id number] Patient Account Number: [id number] Date of Birth/Sex: [birth year] (59 y.o. Female) Treating RN: [name] Primary Care Physician: [name] Other Clinician: Referring Physician: [name] Treating Physician/Extender: Frann Rider in Treatment: 17 Vital Signs Time Taken: 08:45 Pulse (bpm): 83 Height (in): 68 Respiratory Rate (breaths/min): 18 Weight (lbs): 257 Blood Pressure (mmHg): 122/68 Body Mass Index (BMI): 39.1 Reference Range: 80 - 120 mg / dl Electronic Signature(s) Signed: 05/11/2015 3:55:58 PM By: Gretta Cool, RN, BSN, Kim RN, BSN Entered By: Gretta Cool, RN, BSN, Kim on 05/11/2015 08:49:08  II Diabetes] [4:Anemia, Arrhythmia, Hypertension, Type II Diabetes] [7:Anemia, Arrhythmia, Hypertension, Type II  Diabetes] Date Acquired: [3:11/07/2014] [4:02/21/2015] [7:03/10/2015] Weeks of Treatment: [3:16] [4:9] [7:8] Wound Status: [3:Open] [4:Open] [7:Open] Measurements L x W x D 0.2x0.2x0.1 [4:0.9x0.2x0.1] [7:0.6x0.7x0.3] (cm) Area (cm) : [3:0.031] [4:0.141] [7:0.33] Volume (cm) : [3:0.003] [4:0.014] [7:0.099] % Reduction in Area: [3:94.40%] [4:97.40%] [7:59.60%] % Reduction in Volume: 97.30% [4:97.40%] [7:-20.70%] Classification: [3:Category/Stage III] [4:Category/Stage III] [7:Grade 1] Exudate Amount: [3:None Present] [4:Medium] [7:Small] Exudate Type: [3:N/A] [4:Serous] [7:Serous] Exudate Color: [3:N/A] [4:amber] [7:amber] Wound Margin: [3:Flat and Intact] [4:Flat and Intact] [7:Flat and Intact] Granulation Amount: [3:Medium (34-66%)] [4:Large (67-100%)] [7:Medium (34-66%)] Granulation Quality: [3:Pink] [4:Red, Pink] [7:Red, Pink] Necrotic Amount: [3:Medium (34-66%)] [4:None Present (0%)] [7:Medium  (34-66%)] Necrotic Tissue: [3:Eschar] [4:N/A] [7:Adherent Slough] Exposed Structures: [3:Fascia: No Fat: No Tendon: No] [4:Fascia: No Fat: No Tendon: No] [7:Fascia: No Fat: No Tendon: No] Muscle: No Muscle: No Muscle: No Joint: No Joint: No Joint: No Bone: No Bone: No Bone: No Limited to Skin Limited to Skin Limited to Skin Breakdown Breakdown Breakdown Epithelialization: Small (1-33%) None None Debridement: N/A N/A Debridement [id number]) Time-Out Taken: N/A N/A Yes Pain Control: N/A N/A Other Tissue Debrided: N/A N/A Fibrin/Slough, Exudates, Callus, Subcutaneous Level: N/A N/A Skin/Subcutaneous Tissue Debridement Area (sq N/A N/A 0.42 cm): Instrument: N/A N/A Curette Bleeding: N/A N/A Minimum Hemostasis Achieved: N/A N/A Pressure Procedural Pain: N/A N/A 0 Post Procedural Pain: N/A N/A 0 Debridement Treatment N/A N/A Procedure was tolerated Response: well Post Debridement N/A N/A 0.6x0.7x0.4 Measurements L x W x D (cm) Post Debridement N/A N/A 0.132 Volume: (cm) Periwound Skin Texture: Edema: No Callus: Yes Edema: No Excoriation: No Edema: No Excoriation: No Induration: No Excoriation: No Induration: No Callus: No Induration: No Callus: No Crepitus: No Crepitus: No Crepitus: No Fluctuance: No Fluctuance: No Fluctuance: No Friable: No Friable: No Friable: No Rash: No Rash: No Rash: No Scarring: No Scarring: No Scarring: No Periwound Skin Maceration: No Maceration: No Maceration: No Moisture: Moist: No Moist: No Moist: No Dry/Scaly: No Dry/Scaly: No Dry/Scaly: No Periwound Skin Color: Atrophie Blanche: No Atrophie Blanche: No Erythema: Yes Cyanosis: No Cyanosis: No Atrophie Blanche: No Ecchymosis: No Ecchymosis: No Cyanosis: No Erythema: No Erythema: No Ecchymosis: No Hemosiderin Staining: No Hemosiderin Staining: No Hemosiderin Staining: No Mottled: No Mottled: No Mottled: No Pallor: No Pallor: No Pallor: No Rubor:  No Rubor: No Rubor: No Temperature: No Abnormality No Abnormality No Abnormality No No No [name], ALWOOD. ([id number]) Tenderness on Palpation: Wound Preparation: Ulcer Cleansing: Ulcer Cleansing: Ulcer Cleansing: Rinsed/Irrigated with Rinsed/Irrigated with Rinsed/Irrigated with Saline Saline Saline Topical Anesthetic Topical Anesthetic Topical Anesthetic Applied: None Applied: Other: lidocaine Applied: Other: lidocaine 4% 4% Assessment Notes: N/A N/A Toe is reddened. Procedures Performed: N/A N/A Debridement Treatment Notes Electronic Signature(s) Signed: 05/11/2015 3:55:58 PM By: Gretta Cool, RN, BSN, Kim RN, BSN Entered By: Gretta Cool, RN, BSN, Kim on 05/11/2015 [id number] [name] ([id number]) -------------------------------------------------------------------------------- [place] Details Patient Name: [name] Date of Service: 05/11/2015 8:45 AM Medical Record Number: [id number] Patient Account Number: [id number] Date of Birth/Sex: Apr 18, 1956 (59 y.o. Female) Treating RN: [name] Primary Care Physician: [name] Other Clinician: Referring Physician: [name] Treating Physician/Extender: Frann Rider in Treatment: 16 Active Inactive Abuse / Safety / Falls / Self Care Management Nursing Diagnoses: Impaired physical mobility Potential for falls Self care deficit: actual or potential Goals: Patient/caregiver will verbalize understanding of skin care regimen Date Initiated: 01/10/2015 Goal Status: Active Patient/caregiver will verbalize/demonstrate measures taken  II Diabetes] [4:Anemia, Arrhythmia, Hypertension, Type II Diabetes] [7:Anemia, Arrhythmia, Hypertension, Type II  Diabetes] Date Acquired: [3:11/07/2014] [4:02/21/2015] [7:03/10/2015] Weeks of Treatment: [3:16] [4:9] [7:8] Wound Status: [3:Open] [4:Open] [7:Open] Measurements L x W x D 0.2x0.2x0.1 [4:0.9x0.2x0.1] [7:0.6x0.7x0.3] (cm) Area (cm) : [3:0.031] [4:0.141] [7:0.33] Volume (cm) : [3:0.003] [4:0.014] [7:0.099] % Reduction in Area: [3:94.40%] [4:97.40%] [7:59.60%] % Reduction in Volume: 97.30% [4:97.40%] [7:-20.70%] Classification: [3:Category/Stage III] [4:Category/Stage III] [7:Grade 1] Exudate Amount: [3:None Present] [4:Medium] [7:Small] Exudate Type: [3:N/A] [4:Serous] [7:Serous] Exudate Color: [3:N/A] [4:amber] [7:amber] Wound Margin: [3:Flat and Intact] [4:Flat and Intact] [7:Flat and Intact] Granulation Amount: [3:Medium (34-66%)] [4:Large (67-100%)] [7:Medium (34-66%)] Granulation Quality: [3:Pink] [4:Red, Pink] [7:Red, Pink] Necrotic Amount: [3:Medium (34-66%)] [4:None Present (0%)] [7:Medium  (34-66%)] Necrotic Tissue: [3:Eschar] [4:N/A] [7:Adherent Slough] Exposed Structures: [3:Fascia: No Fat: No Tendon: No] [4:Fascia: No Fat: No Tendon: No] [7:Fascia: No Fat: No Tendon: No] Muscle: No Muscle: No Muscle: No Joint: No Joint: No Joint: No Bone: No Bone: No Bone: No Limited to Skin Limited to Skin Limited to Skin Breakdown Breakdown Breakdown Epithelialization: Small (1-33%) None None Debridement: N/A N/A Debridement [id number]) Time-Out Taken: N/A N/A Yes Pain Control: N/A N/A Other Tissue Debrided: N/A N/A Fibrin/Slough, Exudates, Callus, Subcutaneous Level: N/A N/A Skin/Subcutaneous Tissue Debridement Area (sq N/A N/A 0.42 cm): Instrument: N/A N/A Curette Bleeding: N/A N/A Minimum Hemostasis Achieved: N/A N/A Pressure Procedural Pain: N/A N/A 0 Post Procedural Pain: N/A N/A 0 Debridement Treatment N/A N/A Procedure was tolerated Response: well Post Debridement N/A N/A 0.6x0.7x0.4 Measurements L x W x D (cm) Post Debridement N/A N/A 0.132 Volume: (cm) Periwound Skin Texture: Edema: No Callus: Yes Edema: No Excoriation: No Edema: No Excoriation: No Induration: No Excoriation: No Induration: No Callus: No Induration: No Callus: No Crepitus: No Crepitus: No Crepitus: No Fluctuance: No Fluctuance: No Fluctuance: No Friable: No Friable: No Friable: No Rash: No Rash: No Rash: No Scarring: No Scarring: No Scarring: No Periwound Skin Maceration: No Maceration: No Maceration: No Moisture: Moist: No Moist: No Moist: No Dry/Scaly: No Dry/Scaly: No Dry/Scaly: No Periwound Skin Color: Atrophie Blanche: No Atrophie Blanche: No Erythema: Yes Cyanosis: No Cyanosis: No Atrophie Blanche: No Ecchymosis: No Ecchymosis: No Cyanosis: No Erythema: No Erythema: No Ecchymosis: No Hemosiderin Staining: No Hemosiderin Staining: No Hemosiderin Staining: No Mottled: No Mottled: No Mottled: No Pallor: No Pallor: No Pallor: No Rubor:  No Rubor: No Rubor: No Temperature: No Abnormality No Abnormality No Abnormality No No No [name], ALWOOD. ([id number]) Tenderness on Palpation: Wound Preparation: Ulcer Cleansing: Ulcer Cleansing: Ulcer Cleansing: Rinsed/Irrigated with Rinsed/Irrigated with Rinsed/Irrigated with Saline Saline Saline Topical Anesthetic Topical Anesthetic Topical Anesthetic Applied: None Applied: Other: lidocaine Applied: Other: lidocaine 4% 4% Assessment Notes: N/A N/A Toe is reddened. Procedures Performed: N/A N/A Debridement Treatment Notes Electronic Signature(s) Signed: 05/11/2015 3:55:58 PM By: Gretta Cool, RN, BSN, Kim RN, BSN Entered By: Gretta Cool, RN, BSN, Kim on 05/11/2015 [id number] [name] ([id number]) -------------------------------------------------------------------------------- [place] Details Patient Name: [name] Date of Service: 05/11/2015 8:45 AM Medical Record Number: [id number] Patient Account Number: [id number] Date of Birth/Sex: Apr 18, 1956 (59 y.o. Female) Treating RN: [name] Primary Care Physician: [name] Other Clinician: Referring Physician: [name] Treating Physician/Extender: Frann Rider in Treatment: 16 Active Inactive Abuse / Safety / Falls / Self Care Management Nursing Diagnoses: Impaired physical mobility Potential for falls Self care deficit: actual or potential Goals: Patient/caregiver will verbalize understanding of skin care regimen Date Initiated: 01/10/2015 Goal Status: Active Patient/caregiver will verbalize/demonstrate measures taken

## 2015-05-12 NOTE — Progress Notes (Signed)
SUZANE, CHALFIN ([id number]) Visit Report for 05/12/2015 Arrival Information Details Patient Name: [name], [name] Date of Service: 05/12/2015 10:00 AM Medical Record Number: [id number] Patient Account Number: [id number] Date of Birth/Sex: [birth year] (59 y.o. Female) Treating RN: Primary Care Physician: [name] Other Clinician: Jacqulyn Bath Referring Physician: [name] Treating Physician/Extender: Frann Rider in Treatment: 8 Visit Information History Since Last Visit Added or deleted any medications: No Patient Arrived: Wheel Chair Any new allergies or adverse reactions: No Arrival Time: 09:35 Had a fall or experienced change in No Accompanied By: husband activities of daily living that may affect Transfer Assistance: Manual risk of falls: Patient Identification Verified: Yes Signs or symptoms of abuse/neglect No Secondary Verification Process Yes since last visito Completed: Hospitalized since last visit: No Patient Requires Transmission- No Has Dressing in Place as Prescribed: Yes Based Precautions: Has Footwear/Offloading in Place as Yes Patient Has Alerts: Yes Prescribed: Patient Alerts: Patient on Blood Left: Surgical Shoe with Thinner Pressure Relief ABI L:0.8, Insole R:0.93 Right: Surgical Shoe with Pressure Relief Insole Pain Present Now: No Electronic Signature(s) Signed: 05/12/2015 2:14:33 PM By: Lorine Bears RCP, RRT, CHT Entered By: Lorine Bears on 05/12/2015 10:37:28 [name] ([id number]) -------------------------------------------------------------------------------- Encounter Discharge Information Details Patient Name: [name] Date of Service: 05/12/2015 10:00 AM Medical Record Number: [id number] Patient Account Number: [id number] Date of Birth/Sex: 04/23/1956 (59 y.o. Female) Treating RN: Primary Care Physician: [name] Other Clinician: Jacqulyn Bath Referring  Physician: [name] Treating Physician/Extender: Frann Rider in Treatment: 17 Encounter Discharge Information Items Discharge Pain Level: 0 Discharge Condition: Stable Ambulatory Status: Wheelchair Discharge Destination: Home Private Transportation: Auto Accompanied By: husband Schedule Follow-up Appointment: No Medication Reconciliation completed and No provided to Patient/Care Darivs Lunden: Clinical Summary of Care: Notes Patient has an HBO treatment scheduled on 05/15/15 at 10:00 am. Electronic Signature(s) Signed: 05/12/2015 2:14:33 PM By: Lorine Bears RCP, RRT, CHT Entered By: Lorine Bears on 05/12/2015 11:53:02 [name] ([id number]) -------------------------------------------------------------------------------- Vitals Details Patient Name: [name] Date of Service: 05/12/2015 10:00 AM Medical Record Number: [id number] Patient Account Number: [id number] Date of Birth/Sex: 24-Feb-1956 (59 y.o. Female) Treating RN: Primary Care Physician: [name] Other Clinician: Jacqulyn Bath Referring Physician: [name] Treating Physician/Extender: Frann Rider in Treatment: 17 Vital Signs Time Taken: 09:38 Temperature (F): 97.7 Height (in): 68 Pulse (bpm): 84 Weight (lbs): 257 Respiratory Rate (breaths/min): 18 Body Mass Index (BMI): 39.1 Blood Pressure (mmHg): 112/60 Capillary Blood Glucose (mg/dl): 160 Reference Range: 80 - 120 mg / dl Electronic Signature(s) Signed: 05/12/2015 2:14:33 PM By: Lorine Bears RCP, RRT, CHT Entered By: Lorine Bears on 05/12/2015 10:37:53

## 2015-05-12 NOTE — Progress Notes (Signed)
KIRSTI, DELEEUW ([id number]) Visit Report for 05/12/2015 HBO Details Patient Name: [name], [name] Date of Service: 05/12/2015 10:00 AM Medical Record Number: [id number] Patient Account Number: [id number] Date of Birth/Sex: 04-Oct-1956 (59 y.o. 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 HBO Treatment Details Treatment Number: 18 Patient Type: Outpatient Chamber Type: Monoplace Chamber #: HBO [id number] Treatment Protocol: 2.0 ATA with 90 minutes oxygen, and no air breaks Treatment Details Compression Rate Down: 1.5 psi / minute De-Compression Rate Up: 1.5 psi / minute Air breaks and breathing Compress Tx Pressure Decompress Decompress periods Begins Reached Begins Ends (leave unused spaces blank) Chamber Pressure 1 ATA 2.0 ATA - - - - - - 2.0 ATA 1 ATA Clock Time (24 hr) 09:51 10:01 - - - - - - 11:32 11:42 Treatment Length: 111 (minutes) Treatment Segments: 4 Capillary Blood Glucose Pre Capillary Blood Glucose (mg/dl): Post Capillary Blood Glucose (mg/dl): Vital Signs Capillary Blood Glucose Reference Range: 80 - 120 mg / dl HBO Diabetic Blood Glucose Intervention Range: <131 mg/dl or >249 mg/dl Time Vitals Blood Respiratory Capillary Blood Glucose Pulse Action Type: Pulse: Temperature: Taken: Pressure: Rate: Glucose (mg/dl): Meter #: Oximetry (%) Taken: Pre 09:38 112/60 84 18 97.7 160 1 none Post 11:47 122/78 72 18 97.7 141 1 none Treatment Response Well LADRINA, CHELLIS ([id number]) Treatment Toleration: Treatment Treatment Completed without Adverse Event Completion Status: HBO  Attestation I certify that I supervised this HBO treatment in accordance with Medicare guidelines. A trained Yes emergency response team is readily available per hospital policies and procedures. Continue HBOT as ordered. Yes Electronic Signature(s) Signed: 05/12/2015 11:56:37 AM By: Christin Fudge MD, FACS Entered By: Christin Fudge on 05/12/2015 11:56:37 [name] ([id number]) -------------------------------------------------------------------------------- HBO Safety Checklist Details Patient Name: [name] Date of Service: 05/12/2015 10:00 AM Medical Record Number: [id number] Patient Account Number: [id number] Date of Birth/Sex: [birth year] (59 y.o. Female) Treating RN: Primary Care Physician: [name] Other Clinician: Jacqulyn Bath Referring Physician: [name] Treating Physician/Extender: Frann Rider in Treatment: 17 HBO Safety Checklist Items Safety Checklist Consent Form Signed Patient voided / foley secured and emptied When did you last eato 10:30 pm 05/11/15 Last dose of injectable or oral agent 8:10 am Ostomy pouch emptied and vented if applicable n/a All implantable devices assessed, documented and approved ICD Intravenous access site secured and place n/a Valuables secured Linens and cotton and cotton/polyester blend (less than 51% polyester) Personal oil-based products / skin lotions / body lotions removed Wigs or hairpieces removed Smoking or tobacco materials removed Books / newspapers / magazines / loose paper removed Cologne, aftershave, perfume and deodorant removed Jewelry removed (may wrap wedding band) Make-up removed Hair care products removed Battery operated devices (external) removed Heating patches and chemical warmers removed Titanium eyewear removed n/a Nail polish cured greater than 10 hours n/a Casting material cured greater than 10 hours n/a Hearing aids removed n/a Loose dentures or partials removed  n/a Prosthetics have been removed n/a Patient demonstrates correct use of air break device (if applicable) Patient concerns have been addressed Patient grounding bracelet on and cord attached to chamber Specifics for Inpatients (complete in addition to above) Medication sheet sent with patient Intravenous medications needed or due during therapy sent with  patient SHAQUENTA, TRIFILETTI ([id number]) Drainage tubes (e.g. nasogastric tube or chest tube secured and vented) Endotracheal or Tracheotomy tube secured Cuff deflated of air and inflated with saline Airway suctioned Electronic Signature(s) Signed: 05/12/2015 2:14:33 PM By: Lorine Bears RCP, RRT, CHT Entered By: Lorine Bears on 05/12/2015 10:39:07

## 2015-05-12 NOTE — Progress Notes (Signed)
ELLAR, KLOSOWSKI ([id number]) Visit Report for 05/11/2015 Arrival Information Details Patient Name: [name], [name] Date of Service: 05/11/2015 10:00 AM Medical Record Number: [id number] Patient Account Number: [id number] Date of Birth/Sex: [birth year] (59 y.o. Female) Treating RN: Primary Care Physician: [name] Other Clinician: Jacqulyn Bath Referring Physician: [name] Treating Physician/Extender: Frann Rider in Treatment: 31 Visit Information History Since Last Visit Added or deleted any medications: No Patient Arrived: Wheel Chair Any new allergies or adverse No Arrival Time: 09:15 reactions: Accompanied By: husband Had a fall or experienced change in No Transfer Assistance: Manual activities of daily living that may affect Patient Identification Verified: Yes risk of falls: Secondary Verification Process Yes Has Dressing in Place as Prescribed: Yes Completed: Has Footwear/Offloading in Place as Yes Patient Requires Transmission- No Prescribed: Based Precautions: Left: Surgical Shoe with Patient Has Alerts: Yes Pressure Relief Insole Patient Alerts: Patient on Blood Right: Surgical Shoe with Thinner Pressure Relief Insole ABI L:0.8, Pain Present Now: No R:0.93 Electronic Signature(s) Signed: 05/11/2015 3:59:48 PM By: Lorine Bears RCP, RRT, CHT Entered By: Lorine Bears on 05/11/2015 09:59:33 [name] ([id number]) -------------------------------------------------------------------------------- Encounter Discharge Information Details Patient Name: [name] Date of Service: 05/11/2015 10:00 AM Medical Record Number: [id number] Patient Account Number: [id number] Date of Birth/Sex: Jul 30, 1956 (59 y.o. Female) Treating RN: Primary Care Physician: [name] Other Clinician: Jacqulyn Bath Referring Physician: [name] Treating Physician/Extender: Frann Rider in Treatment:  17 Encounter Discharge Information Items Discharge Pain Level: 0 Discharge Condition: Stable Ambulatory Status: Wheelchair Discharge Destination: Home Private Transportation: Auto Accompanied By: husband Schedule Follow-up Appointment: No Medication Reconciliation completed and No provided to Patient/Care Hargun Spurling: Clinical Summary of Care: Notes Patient has an HBO treatment scheduled on 05/12/15 at 10:00 am. Electronic Signature(s) Signed: 05/11/2015 3:59:48 PM By: Lorine Bears RCP, RRT, CHT Entered By: Lorine Bears on 05/11/2015 11:57:38 [name] ([id number]) -------------------------------------------------------------------------------- Vitals Details Patient Name: [name] Date of Service: 05/11/2015 10:00 AM Medical Record Number: [id number] Patient Account Number: [id number] Date of Birth/Sex: 03/03/1956 (59 y.o. Female) Treating RN: Primary Care Physician: [name] Other Clinician: Jacqulyn Bath Referring Physician: [name] Treating Physician/Extender: Frann Rider in Treatment: 17 Vital Signs Time Taken: 09:26 Temperature (F): 94.4 Height (in): 68 Pulse (bpm): 83 Weight (lbs): 257 Respiratory Rate (breaths/min): 18 Body Mass Index (BMI): 39.1 Blood Pressure (mmHg): 122/68 Capillary Blood Glucose (mg/dl): 124 Reference Range: 80 - 120 mg / dl Electronic Signature(s) Signed: 05/11/2015 3:59:48 PM By: Lorine Bears RCP, RRT, CHT Entered By: Lorine Bears on 05/11/2015 10:15:26

## 2015-05-12 NOTE — Progress Notes (Signed)
BREXLYN, OTTEY ([id number]) Visit Report for 05/11/2015 HBO Details Patient Name: [name], [name] Date of Service: 05/11/2015 10:00 AM Medical Record Number: [id number] Patient Account Number: [id number] Date of Birth/Sex: 1955-12-01 (59 y.o. 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 HBO Treatment Details Treatment Number: 17 Patient Type: Outpatient Chamber Type: Monoplace Chamber #: HBO [id number] Treatment Protocol: 2.0 ATA with 90 minutes oxygen, and no air breaks Treatment Details Compression Rate Down: 1.5 psi / minute De-Compression Rate Up: 1.5 psi / minute Air breaks and breathing Compress Tx Pressure Decompress Decompress periods Begins Reached Begins Ends (leave unused spaces blank) Chamber Pressure 1 ATA 2.0 ATA - - - - - - 2.0 ATA 1 ATA Clock Time (24 hr) 09:47 09:57 - - - - - - 11:27 11:37 Treatment Length: 110 (minutes) Treatment Segments: 4 Capillary Blood Glucose Pre Capillary Blood Glucose (mg/dl): Post Capillary Blood Glucose (mg/dl): Vital Signs Capillary Blood Glucose Reference Range: 80 - 120 mg / dl HBO Diabetic Blood Glucose Intervention Range: <131 mg/dl or >249 mg/dl Time Vitals Blood Respiratory Capillary Blood Glucose Pulse Action Type: Pulse: Temperature: Taken: Pressure: Rate: Glucose (mg/dl): Meter #: Oximetry (%) Taken: Pre 09:26 122/68 83 18 94.4 124 1 Gave patient 8 oz. Ensure Pre 09:26 161 1 none; proceed per protocol Post 11:44 143/91 78 18 97.7 187 1 none Treatment Response RASHEEDA, RADU ([id number]) Treatment Well Toleration: Treatment Treatment  Completed without Adverse Event Completion Status: Electronic Signature(s) Signed: 05/11/2015 12:28:06 PM By: Christin Fudge MD, FACS Signed: 05/11/2015 3:59:48 PM By: Lorine Bears RCP, RRT, CHT Previous Signature: 05/11/2015 11:55:26 AM Version By: Christin Fudge MD, FACS Entered By: Lorine Bears on 05/11/2015 11:56:52 [name] ([id number]) -------------------------------------------------------------------------------- HBO Safety Checklist Details Patient Name: [name] Date of Service: 05/11/2015 10:00 AM Medical Record Number: [id number] Patient Account Number: [id number] Date of Birth/Sex: 26-Mar-1956 (59 y.o. Female) Treating RN: Primary Care Physician: [name] Other Clinician: Jacqulyn Bath Referring Physician: [name] Treating Physician/Extender: Frann Rider in Treatment: 17 HBO Safety Checklist Items Safety Checklist Consent Form Signed Patient voided / foley secured and emptied When did you last eato 08:00 am Last dose of injectable or oral agent 07:45 am Ostomy pouch emptied and vented if applicable n/a All implantable devices assessed, documented and approved ICD Intravenous access site secured and place n/a Valuables secured Linens and cotton and cotton/polyester blend (less than 51% polyester) Personal oil-based products / skin lotions / body lotions removed Wigs or hairpieces removed Smoking or tobacco materials removed Books / newspapers / magazines / loose paper removed Cologne, aftershave, perfume and deodorant removed Jewelry removed (may wrap wedding band) Make-up removed Hair care products removed Battery operated devices (external) removed Heating patches and chemical warmers removed Titanium eyewear removed n/a Nail polish cured greater than 10 hours n/a Casting material cured greater than 10 hours n/a Hearing aids removed n/a Loose dentures or partials removed n/a Prosthetics have been  removed n/a Patient demonstrates correct use of air break device (if applicable) Patient concerns have been addressed Patient grounding bracelet on and cord attached to chamber Specifics for Inpatients (complete in addition to above) Medication sheet sent with patient Intravenous medications needed or due during therapy sent with  patient TYREKA, SZELIGA ([id number]) Drainage tubes (e.g. nasogastric tube or chest tube secured and vented) Endotracheal or Tracheotomy tube secured Cuff deflated of air and inflated with saline Airway suctioned Electronic Signature(s) Signed: 05/11/2015 3:59:48 PM By: Lorine Bears RCP, RRT, CHT Entered By: Lorine Bears on 05/11/2015 10:16:32

## 2015-05-12 NOTE — Progress Notes (Addendum)
[name], [name] ([id number]) Visit Report for 05/11/2015 Chief Complaint Document Details Patient Name: [name], [name] Date of Service: 05/11/2015 8:45 AM Medical Record Number: [id number] Patient Account Number: [id number] Date of Birth/Sex: 05-Dec-1955 (59 y.o. Female) Treating RN: Primary Care Physician: [name] Other Clinician: Referring Physician: [name] Treating Physician/Extender: Frann Rider in Treatment: 55 Information Obtained from: Patient Chief Complaint Patient presents to the wound care center for a consult due non healing wound 59 year old patient who comes with a history of an ulcer on the left second toe for at least about 4 months and a history on her left heel for about 3 weeks. I needed to get an MRI of her left foot but due to her defibrillator this could not be done and hence she is getting get a triple phase bone scan. 01/17/2015 -- she has developed a significant injury to the bridge of the nose where her CPAP machine was fitting. This is a painfull dark spot. Electronic Signature(s) Signed: 05/11/2015 9:13:24 AM By: Christin Fudge MD, FACS Entered By: Christin Fudge on 05/11/2015 09:13:24 [name] ([id number]) -------------------------------------------------------------------------------- Debridement Details Patient Name: [name] Date of Service: 05/11/2015 8:45 AM Medical Record Number: [id number] Patient Account Number: [id number] Date of Birth/Sex: [birth year] (59 y.o. Female) Treating RN: Primary Care Physician: [name] Other Clinician: Referring Physician: [name] Treating Physician/Extender: Frann Rider in Treatment: 17 Debridement Performed for Wound #7 Right Toe Second Assessment: Performed By: Physician Pat Patrick., MD Debridement: Debridement Pre-procedure Yes Verification/Time Out Taken: Start Time: 09:05 Pain Control: Other : lidiocaine 4% Level: Skin/Subcutaneous  Tissue Total Area Debrided (L x 0.6 (cm) x 0.7 (cm) = 0.42 (cm) W): Tissue and other Viable, Non-Viable, Callus, Exudate, Fibrin/Slough, Subcutaneous material debrided: Instrument: Curette Bleeding: Minimum Hemostasis Achieved: Pressure End Time: 09:11 Procedural Pain: 0 Post Procedural Pain: 0 Response to Treatment: Procedure was tolerated well Post Debridement Measurements of Total Wound Length: (cm) 0.6 Width: (cm) 0.7 Depth: (cm) 0.4 Volume: (cm) 0.132 Electronic Signature(s) Signed: 05/11/2015 9:13:13 AM By: Christin Fudge MD, FACS Entered By: Christin Fudge on 05/11/2015 09:13:13 [name] ([id number]) -------------------------------------------------------------------------------- HPI Details Patient Name: [name] Date of Service: 05/11/2015 8:45 AM Medical Record Number: [id number] Patient Account Number: [id number] Date of Birth/Sex: [birth year] (59 y.o. Female) Treating RN: Primary Care Physician: [name] Other Clinician: Referring Physician: [name] Treating Physician/Extender: Frann Rider in Treatment: 23 History of Present Illness HPI Description: 59 year old patient who is known to have diabetes for several years and generally has it under good control last hemoglobin A1c was 6.7 done 2 weeks ago. She has several other comorbidities including acute CHF, hypoxia, hyperlipidemia, systolic heart failure, myocardial infarction, coronary artery disease, iron deficiency anemia, renal insufficiency, essential hypertension, morbid obesity and obstructive sleep apnea. 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 In the past she's had a coronary angioplasty with stent in 2013, laparoscopic cholecystectomy in 2011, bilateral oophorectomy in April 2012 and she's had some lumbar disc surgery in 1996. She says she's had a lot of peripheral neuropathy and due to this she's had a ulcer on her  left second toe for a while. She had a dry eschar on her left heel and recently this was removed and she found that she had some infection and some drainage from there. She has minimal pain around the heel but no pain on any areas of her toes. she has not been on any antibiotics recently  [name], [name] ([id number]) Visit Report for 05/11/2015 Chief Complaint Document Details Patient Name: [name], [name] Date of Service: 05/11/2015 8:45 AM Medical Record Number: [id number] Patient Account Number: [id number] Date of Birth/Sex: 05-Dec-1955 (59 y.o. Female) Treating RN: Primary Care Physician: [name] Other Clinician: Referring Physician: [name] Treating Physician/Extender: Frann Rider in Treatment: 55 Information Obtained from: Patient Chief Complaint Patient presents to the wound care center for a consult due non healing wound 59 year old patient who comes with a history of an ulcer on the left second toe for at least about 4 months and a history on her left heel for about 3 weeks. I needed to get an MRI of her left foot but due to her defibrillator this could not be done and hence she is getting get a triple phase bone scan. 01/17/2015 -- she has developed a significant injury to the bridge of the nose where her CPAP machine was fitting. This is a painfull dark spot. Electronic Signature(s) Signed: 05/11/2015 9:13:24 AM By: Christin Fudge MD, FACS Entered By: Christin Fudge on 05/11/2015 09:13:24 [name] ([id number]) -------------------------------------------------------------------------------- Debridement Details Patient Name: [name] Date of Service: 05/11/2015 8:45 AM Medical Record Number: [id number] Patient Account Number: [id number] Date of Birth/Sex: [birth year] (59 y.o. Female) Treating RN: Primary Care Physician: [name] Other Clinician: Referring Physician: [name] Treating Physician/Extender: Frann Rider in Treatment: 17 Debridement Performed for Wound #7 Right Toe Second Assessment: Performed By: Physician Pat Patrick., MD Debridement: Debridement Pre-procedure Yes Verification/Time Out Taken: Start Time: 09:05 Pain Control: Other : lidiocaine 4% Level: Skin/Subcutaneous  Tissue Total Area Debrided (L x 0.6 (cm) x 0.7 (cm) = 0.42 (cm) W): Tissue and other Viable, Non-Viable, Callus, Exudate, Fibrin/Slough, Subcutaneous material debrided: Instrument: Curette Bleeding: Minimum Hemostasis Achieved: Pressure End Time: 09:11 Procedural Pain: 0 Post Procedural Pain: 0 Response to Treatment: Procedure was tolerated well Post Debridement Measurements of Total Wound Length: (cm) 0.6 Width: (cm) 0.7 Depth: (cm) 0.4 Volume: (cm) 0.132 Electronic Signature(s) Signed: 05/11/2015 9:13:13 AM By: Christin Fudge MD, FACS Entered By: Christin Fudge on 05/11/2015 09:13:13 [name] ([id number]) -------------------------------------------------------------------------------- HPI Details Patient Name: [name] Date of Service: 05/11/2015 8:45 AM Medical Record Number: [id number] Patient Account Number: [id number] Date of Birth/Sex: [birth year] (59 y.o. Female) Treating RN: Primary Care Physician: [name] Other Clinician: Referring Physician: [name] Treating Physician/Extender: Frann Rider in Treatment: 23 History of Present Illness HPI Description: 59 year old patient who is known to have diabetes for several years and generally has it under good control last hemoglobin A1c was 6.7 done 2 weeks ago. She has several other comorbidities including acute CHF, hypoxia, hyperlipidemia, systolic heart failure, myocardial infarction, coronary artery disease, iron deficiency anemia, renal insufficiency, essential hypertension, morbid obesity and obstructive sleep apnea. 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 In the past she's had a coronary angioplasty with stent in 2013, laparoscopic cholecystectomy in 2011, bilateral oophorectomy in April 2012 and she's had some lumbar disc surgery in 1996. She says she's had a lot of peripheral neuropathy and due to this she's had a ulcer on her  left second toe for a while. She had a dry eschar on her left heel and recently this was removed and she found that she had some infection and some drainage from there. She has minimal pain around the heel but no pain on any areas of her toes. she has not been on any antibiotics recently  certify, that based on my findings, NURSING services are a medically necessary home health service. HOME BOUND STATUS: I certify that my clinical findings support that this patient is homebound (i.e., Due to illness or injury, pt requires aid of supportive devices such as crutches, cane, wheelchairs, walkers, the use of special transportation or the assistance of another person to leave their place of residence. There is a normal inability to leave the home and doing so requires considerable and taxing effort. Other absences are for medical reasons / religious services and are infrequent or of short duration when for other reasons). o If current dressing causes regression in wound condition, may D/C ordered dressing product/s and apply Normal Saline Moist Dressing daily until next [place] / Other MD appointment. [place] of regression in wound condition at [phone]. o Please direct any NON-WOUND related issues/requests for orders to patient's Primary Care Physician Wound #7 Right Toe Second o Pro[facility]er Nurse may visit PRN to address patientos wound care needs. o FACE TO FACE ENCOUNTER: MEDICARE and MEDICAID PATIENTS: I certify that this patient is under my care and that I had a face-to-face encounter that meets the physician face-to-face encounter requirements with this patient on this date. The encounter with the patient was in whole or in part for the following MEDICAL CONDITION: (primary reason for [place]) MEDICAL NECESSITY: I certify, that based on my findings, NURSING services are a medically necessary home health service. HOME BOUND STATUS: I certify that my clinical  findings support that this patient is homebound (i.e., Due to illness or injury, pt requires aid of supportive devices such as crutches, cane, wheelchairs, walkers, the use of special transportation or the assistance of another person to leave their place of residence. There is a normal inability to leave the home and doing so requires considerable and taxing effort. Other absences are for medical reasons / religious services and are infrequent or of short duration when for other reasons). o If current dressing causes regression in wound condition, may D/C ordered dressing product/s and apply Normal Saline Moist Dressing daily until next [place] / Other MD appointment. [place] of regression in wound condition at [phone]. o Please direct any NON-WOUND related issues/requests for orders to patient's Primary Care Physician Medications-please add to medication list. Wound #3 Nose [name], [name]. ([id number]) o P.O. Antibiotics - continue Cipro Wound #4 Medial Coccyx o P.O. Antibiotics - continue Cipro Wound #7 Right Toe Second o P.O. Antibiotics - continue Cipro Electronic Signature(s) Signed: 05/11/2015 12:28:06 PM By: Christin Fudge MD, FACS Signed: 05/11/2015 3:55:58 PM By: Gretta Cool RN, BSN, Kim RN, BSN Entered By: Gretta Cool, RN, BSN, Kim on 05/11/2015 09:20:08 [name] ([id number]) -------------------------------------------------------------------------------- Problem List Details Patient Name: [name] Date of Service: 05/11/2015 8:45 AM Medical Record Number: [id number] Patient Account Number: [id number] Date of Birth/Sex: [birth year] (59 y.o. 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 Main complaint is new buttocks ulcers. No fever or chills. Minimal drainage. 03/10/2015 -- There is a new ulceration noticed yesterday on her right second toe at the tip. She also has noticed some ulcerations on her gluteal region and we will need to take a look at this. As far as her medications go she does not know for sure what her dosage of amiodarone is exactly. She will look it up and let me know today. 03/17/2015 -- as far as her dosage of amiodarone she was taking : 02/25/15 - 03/02/15 400 mg per day; 03/03/15 - 03/10/15 200 mg per day. She stopped taking the medication on 03/10/15. She has not yet done the checks x-ray and her PFTs are still pending. Recent Lab work done on 03/13/2015 shows a glucose is 124, K+ was 3.3, her albumen was 2.9, WBC count was 3.4, hemoglobin was 9.3, hematocrit was 30.4 and platelets were 240. C-reactive protein was 33.7 which was high and the Vancomycin trough was 18.6 which was high. 03/24/2015 -- he saw several doctors since I saw her last and they include her medical oncologist, cardiologist, neurologist and infectious disease doctor. Her PICC line is out and she now is on oral Cipro and doxycycline as per her verbal report. She has completed her pulmonary function test yesterday and a recent chest x-ray is within normal limits. If the PFTs within normal limits she will be able to restart the hyperbaric oxygen therapy. 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 Underlying interstitial lung disease to be considered. Further recommendations were that of a CT chest and pulmonary consult if indicated. 05/11/2015 -- due to some discomfort in her ears during hyperbaric oxygen therapy, she saw ENT yesterday who basically said everything was good and continue with  hyperbaric therapy. She has minimal redness of her right second toe but she is still on ciprofloxacin. [name], [name] ([id number]) Electronic Signature(s) Signed: 05/11/2015 9:14:33 AM By: Christin Fudge MD, FACS Entered By: Christin Fudge on 05/11/2015 09:14:33 [name] ([id number]) -------------------------------------------------------------------------------- Physical Exam Details Patient Name: [name] Date of Service: 05/11/2015 8:45 AM Medical Record Number: [id number] Patient Account Number: [id number] Date of Birth/Sex: November 28, 1955 (59 y.o. Female) Treating RN: Primary Care Physician: [name] Other Clinician: Referring Physician: [name] Treating Physician/Extender: Frann Rider in Treatment: 17 Constitutional . Pulse regular. Respirations normal and unlabored. Afebrile. . Eyes Nonicteric. Reactive to light. Ears, Nose, Mouth, and Throat Lips, teeth, and gums WNL.Marland Kitchen Moist mucosa without lesions . Neck supple and nontender. No palpable supraclavicular or cervical adenopathy. Normal sized without goiter. Respiratory WNL. No retractions.. Cardiovascular Pedal Pulses WNL. No clubbing, cyanosis or edema. Lymphatic No adneopathy. No adenopathy. No adenopathy. Musculoskeletal Adexa without tenderness or enlargement.. Digits and nails w/o clubbing, cyanosis, infection, petechiae, ischemia, or inflammatory conditions.. Integumentary (Hair, Skin) No suspicious lesions. No crepitus or fluctuance. No peri-wound warmth or erythema. No masses.Marland Kitchen Psychiatric Judgement and insight Intact.. No evidence of depression, anxiety, or agitation.. Notes The wound on the second toe looks clean and has minimal surrounding debris and this will be sharply debrided with a curette. Electronic Signature(s) Signed: 05/11/2015 9:15:13 AM By: Christin Fudge MD, FACS Entered By: Christin Fudge on 05/11/2015 09:15:13 [name]  ([id number]) -------------------------------------------------------------------------------- Physician Orders Details Patient Name: [name] Date of Service: 05/11/2015 8:45 AM Medical Record Number: [id number] Patient Account Number: [id number] Date of Birth/Sex:  heel feels better. Main complaint is new buttocks ulcers. No fever or chills. Minimal drainage. 03/10/2015 -- There is a new ulceration noticed yesterday on her right second toe at the tip. She also has noticed some ulcerations on her gluteal region and we will need to take a look at this. As far as her medications go she does not know for sure what her dosage of amiodarone is exactly. She will look it up and let me know today. 03/17/2015 -- as far as her dosage of amiodarone she was taking : 02/25/15 - 03/02/15 400 mg per day; 03/03/15 - 03/10/15 200 mg per day. She stopped taking the medication on 03/10/15. She has not yet done the checks x-ray and her PFTs are still pending. Recent Lab work done on 03/13/2015 shows a glucose is 124, K+ was 3.3, her albumen was 2.9, WBC count was 3.4, hemoglobin was 9.3, hematocrit was 30.4 and platelets were 240. C-reactive protein was 33.7 which was high and the Vancomycin trough was 18.6 which was high. 03/24/2015 -- he saw several doctors since I saw her last and they include her medical oncologist, cardiologist, neurologist and infectious disease doctor. Her PICC line is out and she now is on oral Cipro and doxycycline as per her verbal report. She has completed her pulmonary function test yesterday and a recent chest x-ray is within normal limits. If the PFTs within normal limits she will be able to restart the hyperbaric oxygen therapy. 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 Underlying interstitial lung disease to be considered. Further recommendations were that of a CT chest and pulmonary consult if indicated. 05/11/2015 -- due to some discomfort in her ears during hyperbaric oxygen therapy, she saw ENT yesterday who basically said everything was good and continue with  hyperbaric therapy. She has minimal redness of her right second toe but she is still on ciprofloxacin. [name], [name] ([id number]) Electronic Signature(s) Signed: 05/11/2015 9:14:33 AM By: Christin Fudge MD, FACS Entered By: Christin Fudge on 05/11/2015 09:14:33 [name] ([id number]) -------------------------------------------------------------------------------- Physical Exam Details Patient Name: [name] Date of Service: 05/11/2015 8:45 AM Medical Record Number: [id number] Patient Account Number: [id number] Date of Birth/Sex: November 28, 1955 (59 y.o. Female) Treating RN: Primary Care Physician: [name] Other Clinician: Referring Physician: [name] Treating Physician/Extender: Frann Rider in Treatment: 17 Constitutional . Pulse regular. Respirations normal and unlabored. Afebrile. . Eyes Nonicteric. Reactive to light. Ears, Nose, Mouth, and Throat Lips, teeth, and gums WNL.Marland Kitchen Moist mucosa without lesions . Neck supple and nontender. No palpable supraclavicular or cervical adenopathy. Normal sized without goiter. Respiratory WNL. No retractions.. Cardiovascular Pedal Pulses WNL. No clubbing, cyanosis or edema. Lymphatic No adneopathy. No adenopathy. No adenopathy. Musculoskeletal Adexa without tenderness or enlargement.. Digits and nails w/o clubbing, cyanosis, infection, petechiae, ischemia, or inflammatory conditions.. Integumentary (Hair, Skin) No suspicious lesions. No crepitus or fluctuance. No peri-wound warmth or erythema. No masses.Marland Kitchen Psychiatric Judgement and insight Intact.. No evidence of depression, anxiety, or agitation.. Notes The wound on the second toe looks clean and has minimal surrounding debris and this will be sharply debrided with a curette. Electronic Signature(s) Signed: 05/11/2015 9:15:13 AM By: Christin Fudge MD, FACS Entered By: Christin Fudge on 05/11/2015 09:15:13 [name]  ([id number]) -------------------------------------------------------------------------------- Physician Orders Details Patient Name: [name] Date of Service: 05/11/2015 8:45 AM Medical Record Number: [id number] Patient Account Number: [id number] Date of Birth/Sex:  certify, that based on my findings, NURSING services are a medically necessary home health service. HOME BOUND STATUS: I certify that my clinical findings support that this patient is homebound (i.e., Due to illness or injury, pt requires aid of supportive devices such as crutches, cane, wheelchairs, walkers, the use of special transportation or the assistance of another person to leave their place of residence. There is a normal inability to leave the home and doing so requires considerable and taxing effort. Other absences are for medical reasons / religious services and are infrequent or of short duration when for other reasons). o If current dressing causes regression in wound condition, may D/C ordered dressing product/s and apply Normal Saline Moist Dressing daily until next [place] / Other MD appointment. [place] of regression in wound condition at [phone]. o Please direct any NON-WOUND related issues/requests for orders to patient's Primary Care Physician Wound #7 Right Toe Second o [facility] Nurse may visit PRN to address patientos wound care needs. o FACE TO FACE ENCOUNTER: MEDICARE and MEDICAID PATIENTS: I certify that this patient is under my care and that I had a face-to-face encounter that meets the physician face-to-face encounter requirements with this patient on this date. The encounter with the patient was in whole or in part for the following MEDICAL CONDITION: (primary reason for [place]) MEDICAL NECESSITY: I certify, that based on my findings, NURSING services are a medically necessary home health service. HOME BOUND STATUS: I certify that my clinical  findings support that this patient is homebound (i.e., Due to illness or injury, pt requires aid of supportive devices such as crutches, cane, wheelchairs, walkers, the use of special transportation or the assistance of another person to leave their place of residence. There is a normal inability to leave the home and doing so requires considerable and taxing effort. Other absences are for medical reasons / religious services and are infrequent or of short duration when for other reasons). o If current dressing causes regression in wound condition, may D/C ordered dressing product/s and apply Normal Saline Moist Dressing daily until next [place] / Other MD appointment. [place] of regression in wound condition at [phone]. o Please direct any NON-WOUND related issues/requests for orders to patient's Primary Care Physician Medications-please add to medication list. Wound #3 Nose [name], [name]. ([id number]) o P.O. Antibiotics - continue Cipro Wound #4 Medial Coccyx o P.O. Antibiotics - continue Cipro Wound #7 Right Toe Second o P.O. Antibiotics - continue Cipro Electronic Signature(s) Signed: 05/11/2015 12:28:06 PM By: Christin Fudge MD, FACS Signed: 05/11/2015 3:55:58 PM By: Gretta Cool RN, BSN, Kim RN, BSN Entered By: Gretta Cool, RN, BSN, Kim on 05/11/2015 09:20:08 [name] ([id number]) -------------------------------------------------------------------------------- Problem List Details Patient Name: [name] Date of Service: 05/11/2015 8:45 AM Medical Record Number: [id number] Patient Account Number: [id number] Date of Birth/Sex: [birth year] (59 y.o. 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 Main complaint is new buttocks ulcers. No fever or chills. Minimal drainage. 03/10/2015 -- There is a new ulceration noticed yesterday on her right second toe at the tip. She also has noticed some ulcerations on her gluteal region and we will need to take a look at this. As far as her medications go she does not know for sure what her dosage of amiodarone is exactly. She will look it up and let me know today. 03/17/2015 -- as far as her dosage of amiodarone she was taking : 02/25/15 - 03/02/15 400 mg per day; 03/03/15 - 03/10/15 200 mg per day. She stopped taking the medication on 03/10/15. She has not yet done the checks x-ray and her PFTs are still pending. Recent Lab work done on 03/13/2015 shows a glucose is 124, K+ was 3.3, her albumen was 2.9, WBC count was 3.4, hemoglobin was 9.3, hematocrit was 30.4 and platelets were 240. C-reactive protein was 33.7 which was high and the Vancomycin trough was 18.6 which was high. 03/24/2015 -- he saw several doctors since I saw her last and they include her medical oncologist, cardiologist, neurologist and infectious disease doctor. Her PICC line is out and she now is on oral Cipro and doxycycline as per her verbal report. She has completed her pulmonary function test yesterday and a recent chest x-ray is within normal limits. If the PFTs within normal limits she will be able to restart the hyperbaric oxygen therapy. 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 Underlying interstitial lung disease to be considered. Further recommendations were that of a CT chest and pulmonary consult if indicated. 05/11/2015 -- due to some discomfort in her ears during hyperbaric oxygen therapy, she saw ENT yesterday who basically said everything was good and continue with  hyperbaric therapy. She has minimal redness of her right second toe but she is still on ciprofloxacin. [name], [name] ([id number]) Electronic Signature(s) Signed: 05/11/2015 9:14:33 AM By: Christin Fudge MD, FACS Entered By: Christin Fudge on 05/11/2015 09:14:33 [name] ([id number]) -------------------------------------------------------------------------------- Physical Exam Details Patient Name: [name] Date of Service: 05/11/2015 8:45 AM Medical Record Number: [id number] Patient Account Number: [id number] Date of Birth/Sex: November 28, 1955 (59 y.o. Female) Treating RN: Primary Care Physician: [name] Other Clinician: Referring Physician: [name] Treating Physician/Extender: Frann Rider in Treatment: 17 Constitutional . Pulse regular. Respirations normal and unlabored. Afebrile. . Eyes Nonicteric. Reactive to light. Ears, Nose, Mouth, and Throat Lips, teeth, and gums WNL.Marland Kitchen Moist mucosa without lesions . Neck supple and nontender. No palpable supraclavicular or cervical adenopathy. Normal sized without goiter. Respiratory WNL. No retractions.. Cardiovascular Pedal Pulses WNL. No clubbing, cyanosis or edema. Lymphatic No adneopathy. No adenopathy. No adenopathy. Musculoskeletal Adexa without tenderness or enlargement.. Digits and nails w/o clubbing, cyanosis, infection, petechiae, ischemia, or inflammatory conditions.. Integumentary (Hair, Skin) No suspicious lesions. No crepitus or fluctuance. No peri-wound warmth or erythema. No masses.Marland Kitchen Psychiatric Judgement and insight Intact.. No evidence of depression, anxiety, or agitation.. Notes The wound on the second toe looks clean and has minimal surrounding debris and this will be sharply debrided with a curette. Electronic Signature(s) Signed: 05/11/2015 9:15:13 AM By: Christin Fudge MD, FACS Entered By: Christin Fudge on 05/11/2015 09:15:13 [name]  ([id number]) -------------------------------------------------------------------------------- Physician Orders Details Patient Name: [name] Date of Service: 05/11/2015 8:45 AM Medical Record Number: [id number] Patient Account Number: [id number] Date of Birth/Sex:  heel feels better. Main complaint is new buttocks ulcers. No fever or chills. Minimal drainage. 03/10/2015 -- There is a new ulceration noticed yesterday on her right second toe at the tip. She also has noticed some ulcerations on her gluteal region and we will need to take a look at this. As far as her medications go she does not know for sure what her dosage of amiodarone is exactly. She will look it up and let me know today. 03/17/2015 -- as far as her dosage of amiodarone she was taking : 02/25/15 - 03/02/15 400 mg per day; 03/03/15 - 03/10/15 200 mg per day. She stopped taking the medication on 03/10/15. She has not yet done the checks x-ray and her PFTs are still pending. Recent Lab work done on 03/13/2015 shows a glucose is 124, K+ was 3.3, her albumen was 2.9, WBC count was 3.4, hemoglobin was 9.3, hematocrit was 30.4 and platelets were 240. C-reactive protein was 33.7 which was high and the Vancomycin trough was 18.6 which was high. 03/24/2015 -- he saw several doctors since I saw her last and they include her medical oncologist, cardiologist, neurologist and infectious disease doctor. Her PICC line is out and she now is on oral Cipro and doxycycline as per her verbal report. She has completed her pulmonary function test yesterday and a recent chest x-ray is within normal limits. If the PFTs within normal limits she will be able to restart the hyperbaric oxygen therapy. 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 Underlying interstitial lung disease to be considered. Further recommendations were that of a CT chest and pulmonary consult if indicated. 05/11/2015 -- due to some discomfort in her ears during hyperbaric oxygen therapy, she saw ENT yesterday who basically said everything was good and continue with  hyperbaric therapy. She has minimal redness of her right second toe but she is still on ciprofloxacin. [name], [name] ([id number]) Electronic Signature(s) Signed: 05/11/2015 9:14:33 AM By: Christin Fudge MD, FACS Entered By: Christin Fudge on 05/11/2015 09:14:33 [name] ([id number]) -------------------------------------------------------------------------------- Physical Exam Details Patient Name: [name] Date of Service: 05/11/2015 8:45 AM Medical Record Number: [id number] Patient Account Number: [id number] Date of Birth/Sex: November 28, 1955 (59 y.o. Female) Treating RN: Primary Care Physician: [name] Other Clinician: Referring Physician: [name] Treating Physician/Extender: Frann Rider in Treatment: 17 Constitutional . Pulse regular. Respirations normal and unlabored. Afebrile. . Eyes Nonicteric. Reactive to light. Ears, Nose, Mouth, and Throat Lips, teeth, and gums WNL.Marland Kitchen Moist mucosa without lesions . Neck supple and nontender. No palpable supraclavicular or cervical adenopathy. Normal sized without goiter. Respiratory WNL. No retractions.. Cardiovascular Pedal Pulses WNL. No clubbing, cyanosis or edema. Lymphatic No adneopathy. No adenopathy. No adenopathy. Musculoskeletal Adexa without tenderness or enlargement.. Digits and nails w/o clubbing, cyanosis, infection, petechiae, ischemia, or inflammatory conditions.. Integumentary (Hair, Skin) No suspicious lesions. No crepitus or fluctuance. No peri-wound warmth or erythema. No masses.Marland Kitchen Psychiatric Judgement and insight Intact.. No evidence of depression, anxiety, or agitation.. Notes The wound on the second toe looks clean and has minimal surrounding debris and this will be sharply debrided with a curette. Electronic Signature(s) Signed: 05/11/2015 9:15:13 AM By: Christin Fudge MD, FACS Entered By: Christin Fudge on 05/11/2015 09:15:13 [name]  ([id number]) -------------------------------------------------------------------------------- Physician Orders Details Patient Name: [name] Date of Service: 05/11/2015 8:45 AM Medical Record Number: [id number] Patient Account Number: [id number] Date of Birth/Sex:  certify, that based on my findings, NURSING services are a medically necessary home health service. HOME BOUND STATUS: I certify that my clinical findings support that this patient is homebound (i.e., Due to illness or injury, pt requires aid of supportive devices such as crutches, cane, wheelchairs, walkers, the use of special transportation or the assistance of another person to leave their place of residence. There is a normal inability to leave the home and doing so requires considerable and taxing effort. Other absences are for medical reasons / religious services and are infrequent or of short duration when for other reasons). o If current dressing causes regression in wound condition, may D/C ordered dressing product/s and apply Normal Saline Moist Dressing daily until next [place] / Other MD appointment. [place] of regression in wound condition at [phone]. o Please direct any NON-WOUND related issues/requests for orders to patient's Primary Care Physician Wound #7 Right Toe Second o [place] Nurse may visit PRN to address patientos wound care needs. o FACE TO FACE ENCOUNTER: MEDICARE and MEDICAID PATIENTS: I certify that this patient is under my care and that I had a face-to-face encounter that meets the physician face-to-face encounter requirements with this patient on this date. The encounter with the patient was in whole or in part for the following MEDICAL CONDITION: (primary reason for [place]) MEDICAL NECESSITY: I certify, that based on my findings, NURSING services are a medically necessary home health service. HOME BOUND STATUS: I certify that my clinical  findings support that this patient is homebound (i.e., Due to illness or injury, pt requires aid of supportive devices such as crutches, cane, wheelchairs, walkers, the use of special transportation or the assistance of another person to leave their place of residence. There is a normal inability to leave the home and doing so requires considerable and taxing effort. Other absences are for medical reasons / religious services and are infrequent or of short duration when for other reasons). o If current dressing causes regression in wound condition, may D/C ordered dressing product/s and apply Normal Saline Moist Dressing daily until next [place] / Other MD appointment. [place] of regression in wound condition at [phone]. o Please direct any NON-WOUND related issues/requests for orders to patient's Primary Care Physician Medications-please add to medication list. Wound #3 Nose [name], [name]. ([id number]) o P.O. Antibiotics - continue Cipro Wound #4 Medial Coccyx o P.O. Antibiotics - continue Cipro Wound #7 Right Toe Second o P.O. Antibiotics - continue Cipro Electronic Signature(s) Signed: 05/11/2015 12:28:06 PM By: Christin Fudge MD, FACS Signed: 05/11/2015 3:55:58 PM By: Gretta Cool RN, BSN, Kim RN, BSN Entered By: Gretta Cool, RN, BSN, Kim on 05/11/2015 09:20:08 [name] ([id number]) -------------------------------------------------------------------------------- Problem List Details Patient Name: [name] Date of Service: 05/11/2015 8:45 AM Medical Record Number: [id number] Patient Account Number: [id number] Date of Birth/Sex: [birth year] (59 y.o. 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 Main complaint is new buttocks ulcers. No fever or chills. Minimal drainage. 03/10/2015 -- There is a new ulceration noticed yesterday on her right second toe at the tip. She also has noticed some ulcerations on her gluteal region and we will need to take a look at this. As far as her medications go she does not know for sure what her dosage of amiodarone is exactly. She will look it up and let me know today. 03/17/2015 -- as far as her dosage of amiodarone she was taking : 02/25/15 - 03/02/15 400 mg per day; 03/03/15 - 03/10/15 200 mg per day. She stopped taking the medication on 03/10/15. She has not yet done the checks x-ray and her PFTs are still pending. Recent Lab work done on 03/13/2015 shows a glucose is 124, K+ was 3.3, her albumen was 2.9, WBC count was 3.4, hemoglobin was 9.3, hematocrit was 30.4 and platelets were 240. C-reactive protein was 33.7 which was high and the Vancomycin trough was 18.6 which was high. 03/24/2015 -- he saw several doctors since I saw her last and they include her medical oncologist, cardiologist, neurologist and infectious disease doctor. Her PICC line is out and she now is on oral Cipro and doxycycline as per her verbal report. She has completed her pulmonary function test yesterday and a recent chest x-ray is within normal limits. If the PFTs within normal limits she will be able to restart the hyperbaric oxygen therapy. 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 Underlying interstitial lung disease to be considered. Further recommendations were that of a CT chest and pulmonary consult if indicated. 05/11/2015 -- due to some discomfort in her ears during hyperbaric oxygen therapy, she saw ENT yesterday who basically said everything was good and continue with  hyperbaric therapy. She has minimal redness of her right second toe but she is still on ciprofloxacin. [name], [name] ([id number]) Electronic Signature(s) Signed: 05/11/2015 9:14:33 AM By: Christin Fudge MD, FACS Entered By: Christin Fudge on 05/11/2015 09:14:33 [name] ([id number]) -------------------------------------------------------------------------------- Physical Exam Details Patient Name: [name] Date of Service: 05/11/2015 8:45 AM Medical Record Number: [id number] Patient Account Number: [id number] Date of Birth/Sex: November 28, 1955 (59 y.o. Female) Treating RN: Primary Care Physician: [name] Other Clinician: Referring Physician: [name] Treating Physician/Extender: Frann Rider in Treatment: 17 Constitutional . Pulse regular. Respirations normal and unlabored. Afebrile. . Eyes Nonicteric. Reactive to light. Ears, Nose, Mouth, and Throat Lips, teeth, and gums WNL.Marland Kitchen Moist mucosa without lesions . Neck supple and nontender. No palpable supraclavicular or cervical adenopathy. Normal sized without goiter. Respiratory WNL. No retractions.. Cardiovascular Pedal Pulses WNL. No clubbing, cyanosis or edema. Lymphatic No adneopathy. No adenopathy. No adenopathy. Musculoskeletal Adexa without tenderness or enlargement.. Digits and nails w/o clubbing, cyanosis, infection, petechiae, ischemia, or inflammatory conditions.. Integumentary (Hair, Skin) No suspicious lesions. No crepitus or fluctuance. No peri-wound warmth or erythema. No masses.Marland Kitchen Psychiatric Judgement and insight Intact.. No evidence of depression, anxiety, or agitation.. Notes The wound on the second toe looks clean and has minimal surrounding debris and this will be sharply debrided with a curette. Electronic Signature(s) Signed: 05/11/2015 9:15:13 AM By: Christin Fudge MD, FACS Entered By: Christin Fudge on 05/11/2015 09:15:13 [name]  ([id number]) -------------------------------------------------------------------------------- Physician Orders Details Patient Name: [name] Date of Service: 05/11/2015 8:45 AM Medical Record Number: [id number] Patient Account Number: [id number] Date of Birth/Sex:  [name], [name] ([id number]) Visit Report for 05/11/2015 Chief Complaint Document Details Patient Name: [name], [name] Date of Service: 05/11/2015 8:45 AM Medical Record Number: [id number] Patient Account Number: [id number] Date of Birth/Sex: 05-Dec-1955 (59 y.o. Female) Treating RN: Primary Care Physician: [name] Other Clinician: Referring Physician: [name] Treating Physician/Extender: Frann Rider in Treatment: 55 Information Obtained from: Patient Chief Complaint Patient presents to the wound care center for a consult due non healing wound 59 year old patient who comes with a history of an ulcer on the left second toe for at least about 4 months and a history on her left heel for about 3 weeks. I needed to get an MRI of her left foot but due to her defibrillator this could not be done and hence she is getting get a triple phase bone scan. 01/17/2015 -- she has developed a significant injury to the bridge of the nose where her CPAP machine was fitting. This is a painfull dark spot. Electronic Signature(s) Signed: 05/11/2015 9:13:24 AM By: Christin Fudge MD, FACS Entered By: Christin Fudge on 05/11/2015 09:13:24 [name] ([id number]) -------------------------------------------------------------------------------- Debridement Details Patient Name: [name] Date of Service: 05/11/2015 8:45 AM Medical Record Number: [id number] Patient Account Number: [id number] Date of Birth/Sex: [birth year] (59 y.o. Female) Treating RN: Primary Care Physician: [name] Other Clinician: Referring Physician: [name] Treating Physician/Extender: Frann Rider in Treatment: 17 Debridement Performed for Wound #7 Right Toe Second Assessment: Performed By: Physician Pat Patrick., MD Debridement: Debridement Pre-procedure Yes Verification/Time Out Taken: Start Time: 09:05 Pain Control: Other : lidiocaine 4% Level: Skin/Subcutaneous  Tissue Total Area Debrided (L x 0.6 (cm) x 0.7 (cm) = 0.42 (cm) W): Tissue and other Viable, Non-Viable, Callus, Exudate, Fibrin/Slough, Subcutaneous material debrided: Instrument: Curette Bleeding: Minimum Hemostasis Achieved: Pressure End Time: 09:11 Procedural Pain: 0 Post Procedural Pain: 0 Response to Treatment: Procedure was tolerated well Post Debridement Measurements of Total Wound Length: (cm) 0.6 Width: (cm) 0.7 Depth: (cm) 0.4 Volume: (cm) 0.132 Electronic Signature(s) Signed: 05/11/2015 9:13:13 AM By: Christin Fudge MD, FACS Entered By: Christin Fudge on 05/11/2015 09:13:13 [name] ([id number]) -------------------------------------------------------------------------------- HPI Details Patient Name: [name] Date of Service: 05/11/2015 8:45 AM Medical Record Number: [id number] Patient Account Number: [id number] Date of Birth/Sex: [birth year] (59 y.o. Female) Treating RN: Primary Care Physician: [name] Other Clinician: Referring Physician: [name] Treating Physician/Extender: Frann Rider in Treatment: 23 History of Present Illness HPI Description: 59 year old patient who is known to have diabetes for several years and generally has it under good control last hemoglobin A1c was 6.7 done 2 weeks ago. She has several other comorbidities including acute CHF, hypoxia, hyperlipidemia, systolic heart failure, myocardial infarction, coronary artery disease, iron deficiency anemia, renal insufficiency, essential hypertension, morbid obesity and obstructive sleep apnea. 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 In the past she's had a coronary angioplasty with stent in 2013, laparoscopic cholecystectomy in 2011, bilateral oophorectomy in April 2012 and she's had some lumbar disc surgery in 1996. She says she's had a lot of peripheral neuropathy and due to this she's had a ulcer on her  left second toe for a while. She had a dry eschar on her left heel and recently this was removed and she found that she had some infection and some drainage from there. She has minimal pain around the heel but no pain on any areas of her toes. she has not been on any antibiotics recently

## 2015-05-15 ENCOUNTER — Encounter: Payer: PPO | Admitting: Surgery

## 2015-05-15 DIAGNOSIS — L97512 Non-pressure chronic ulcer of other part of right foot with fat layer exposed: Secondary | ICD-10-CM | POA: Diagnosis not present

## 2015-05-15 LAB — GLUCOSE, CAPILLARY
Glucose-Capillary: 153 mg/dL — ABNORMAL HIGH (ref 65–99)
Glucose-Capillary: 164 mg/dL — ABNORMAL HIGH (ref 65–99)

## 2015-05-15 NOTE — Progress Notes (Signed)
SULAY, MITCHELL ([id number]) Visit Report for 05/15/2015 HBO Details Patient Name: [name], [name] Date of Service: 05/15/2015 10:00 AM Medical Record Number: [id number] Patient Account Number: [id number] Date of Birth/Sex: [birth year] (59 y.o. 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 HBO Treatment Details Treatment Number: 19 Patient Type: Outpatient Chamber Type: Monoplace Chamber #: HBO [id number] Treatment Protocol: 2.0 ATA with 90 minutes oxygen, and no air breaks Treatment Details Compression Rate Down: 1.5 psi / minute De-Compression Rate Up: 1.5 psi / minute Air breaks and breathing Compress Tx Pressure Decompress Decompress periods Begins Reached Begins Ends (leave unused spaces blank) Chamber Pressure 1 ATA 2.0 ATA - - - - - - 2.0 ATA 1 ATA Clock Time (24 hr) 09:25 09:35 - - - - - - 11:06 11:16 Treatment Length: 111 (minutes) Treatment Segments: 4 Capillary Blood Glucose Pre Capillary Blood Glucose (mg/dl): Post Capillary Blood Glucose (mg/dl): Vital Signs Capillary Blood Glucose Reference Range: 80 - 120 mg / dl HBO Diabetic Blood Glucose Intervention Range: <131 mg/dl or >249 mg/dl Time Vitals Blood Respiratory Capillary Blood Glucose Pulse Action Type: Pulse: Temperature: Taken: Pressure: Rate: Glucose (mg/dl): Meter #: Oximetry (%) Taken: Pre 09:10 100/60 90 18 97.8 153 1 none Post 11:22 142/70 90 18 98.1 164 1 none Treatment Response Well [name], [name] ([id number]) Treatment Toleration: Treatment Treatment Completed without Adverse Event Completion Status: Electronic  Signature(s) Signed: 05/15/2015 1:11:05 PM By: Christin Fudge MD, FACS Signed: 05/15/2015 3:21:13 PM By: Lorine Bears RCP, RRT, CHT Previous Signature: 05/15/2015 11:31:36 AM Version By: Christin Fudge MD, FACS Entered By: Lorine Bears on 05/15/2015 11:38:04 [name] ([id number]) -------------------------------------------------------------------------------- HBO Safety Checklist Details Patient Name: [name] Date of Service: 05/15/2015 10:00 AM Medical Record Number: [id number] Patient Account Number: [id number] Date of Birth/Sex: September 02, 1956 (59 y.o. Female) Treating RN: Primary Care Physician: [name] Other Clinician: Jacqulyn Bath Referring Physician: [name] Treating Physician/Extender: Frann Rider in Treatment: 17 HBO Safety Checklist Items Safety Checklist Consent Form Signed Patient voided / foley secured and emptied When did you last eato 19:30 pm 05/14/15 Last dose of injectable or oral agent 08:30 am Ostomy pouch emptied and vented if applicable n/a All implantable devices assessed, documented and approved ICD Intravenous access site secured and place n/a Valuables secured Linens and cotton and cotton/polyester blend (less than 51% polyester) Personal oil-based products / skin lotions / body lotions removed Wigs or hairpieces removed Smoking or tobacco materials removed Books / newspapers / magazines / loose paper removed Cologne, aftershave, perfume and deodorant removed Jewelry removed (may wrap wedding band) Make-up removed Hair care products removed Battery operated devices (external) removed Heating patches and chemical warmers removed Titanium eyewear removed n/a Nail polish cured greater than 10 hours n/a Casting material cured greater than 10 hours n/a Hearing aids removed n/a Loose dentures or partials removed n/a Prosthetics have been removed n/a Patient demonstrates correct use of air break  device (if applicable) Patient concerns have been addressed Patient grounding bracelet on and cord attached to chamber Specifics for Inpatients (complete in addition to above) Medication sheet sent with patient Intravenous medications needed or due during therapy sent with patient [name], [name] ([id number]) Drainage tubes (e.g. nasogastric tube or  chest tube secured and vented) Endotracheal or Tracheotomy tube secured Cuff deflated of air and inflated with saline Airway suctioned Electronic Signature(s) Signed: 05/15/2015 3:21:13 PM By: Lorine Bears RCP, RRT, CHT Entered By: Lorine Bears on 05/15/2015 [id number]

## 2015-05-15 NOTE — Progress Notes (Signed)
Vickie, [name] ([id number]) Visit Report for 05/15/2015 Arrival Information Details Patient Name: [name], [name] Date of Service: 05/15/2015 10:00 AM Medical Record Number: [id number] Patient Account Number: [id number] Date of Birth/Sex: 03/03/1956 (59 y.o. Female) Treating RN: Primary Care Physician: [name] Other Clinician: Jacqulyn Bath Referring Physician: [name] Treating Physician/Extender: Frann Rider in Treatment: 85 Visit Information History Since Last Visit Added or deleted any medications: No Patient Arrived: Wheel Chair Any new allergies or adverse reactions: No Arrival Time: 09:05 Had a fall or experienced change in No Accompanied By: husband activities of daily living that may affect Transfer Assistance: Manual risk of falls: Patient Identification Verified: Yes Signs or symptoms of abuse/neglect No Secondary Verification Process Yes since last visito Completed: Hospitalized since last visit: No Patient Requires Transmission- No Has Dressing in Place as Prescribed: Yes Based Precautions: Has Footwear/Offloading in Place as Yes Patient Has Alerts: Yes Prescribed: Patient Alerts: Patient on Blood Left: Surgical Shoe with Thinner Pressure Relief ABI L:0.8, Insole R:0.93 Right: Surgical Shoe with Pressure Relief Insole Pain Present Now: No Electronic Signature(s) Signed: 05/15/2015 3:21:13 PM By: Lorine Bears RCP, RRT, CHT Entered By: Lorine Bears on 05/15/2015 09:17:23 [name] ([id number]) -------------------------------------------------------------------------------- Encounter Discharge Information Details Patient Name: [name] Date of Service: 05/15/2015 10:00 AM Medical Record Number: [id number] Patient Account Number: [id number] Date of Birth/Sex: 02/08/1956 (59 y.o. Female) Treating RN: Primary Care Physician: [name] Other Clinician: Jacqulyn Bath Referring  Physician: [name] Treating Physician/Extender: Frann Rider in Treatment: 17 Encounter Discharge Information Items Discharge Pain Level: 0 Discharge Condition: Stable Ambulatory Status: Wheelchair Discharge Destination: Home Private Transportation: Auto Accompanied By: husband Schedule Follow-up Appointment: No Medication Reconciliation completed and No provided to Patient/Care Hanaan Gancarz: Clinical Summary of Care: Notes Patient has an HBO treatment scheduled on 05/16/15 at 10:00 am. Electronic Signature(s) Signed: 05/15/2015 3:21:13 PM By: Lorine Bears RCP, RRT, CHT Entered By: Lorine Bears on 05/15/2015 11:38:51 [name] ([id number]) -------------------------------------------------------------------------------- Vitals Details Patient Name: [name] Date of Service: 05/15/2015 10:00 AM Medical Record Number: [id number] Patient Account Number: [id number] Date of Birth/Sex: [birth year] (59 y.o. Female) Treating RN: Primary Care Physician: [name] Other Clinician: Jacqulyn Bath Referring Physician: [name] Treating Physician/Extender: Frann Rider in Treatment: 17 Vital Signs Time Taken: 09:10 Temperature (F): 97.8 Height (in): 68 Pulse (bpm): 90 Weight (lbs): 257 Respiratory Rate (breaths/min): 18 Body Mass Index (BMI): 39.1 Blood Pressure (mmHg): 100/60 Capillary Blood Glucose (mg/dl): 153 Reference Range: 80 - 120 mg / dl Electronic Signature(s) Signed: 05/15/2015 3:21:13 PM By: Lorine Bears RCP, RRT, CHT Entered By: Lorine Bears on 05/15/2015 09:18:51

## 2015-05-16 ENCOUNTER — Encounter: Payer: PPO | Admitting: Surgery

## 2015-05-16 DIAGNOSIS — L97512 Non-pressure chronic ulcer of other part of right foot with fat layer exposed: Secondary | ICD-10-CM | POA: Diagnosis not present

## 2015-05-16 LAB — BASIC METABOLIC PANEL
BUN: 22 mg/dL — AB (ref 4–21)
CREATININE: 1.2 mg/dL — AB (ref 0.5–1.1)
Glucose: 243 mg/dL
Potassium: 4.4 mmol/L (ref 3.4–5.3)
SODIUM: 137 mmol/L (ref 137–147)

## 2015-05-16 LAB — GLUCOSE, CAPILLARY
Glucose-Capillary: 288 mg/dL — ABNORMAL HIGH (ref 65–99)
Glucose-Capillary: 405 mg/dL — ABNORMAL HIGH (ref 65–99)

## 2015-05-17 ENCOUNTER — Encounter: Payer: Self-pay | Admitting: Cardiology

## 2015-05-17 ENCOUNTER — Encounter: Payer: PPO | Admitting: Surgery

## 2015-05-17 DIAGNOSIS — L97512 Non-pressure chronic ulcer of other part of right foot with fat layer exposed: Secondary | ICD-10-CM | POA: Diagnosis not present

## 2015-05-17 LAB — GLUCOSE, CAPILLARY
GLUCOSE-CAPILLARY: 252 mg/dL — AB (ref 65–99)
GLUCOSE-CAPILLARY: 215 mg/dL — AB (ref 65–99)

## 2015-05-17 NOTE — Progress Notes (Signed)
[name] ([id number]) Visit Report for 05/16/2015 HBO Details Patient Name: [name], [name] Date of Service: 05/16/2015 10:00 AM Medical Record Number: [id number] Patient Account Number: [id number] Date of Birth/Sex: 1955-12-11 (59 y.o. 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 HBO Treatment Details Treatment Number: 20 Patient Type: Outpatient Chamber Type: Monoplace Chamber #: HBO [id number] Treatment Protocol: 2.0 ATA with 90 minutes oxygen, and no air breaks Treatment Details Compression Rate Down: 1.5 psi / minute De-Compression Rate Up: 1.5 psi / minute Air breaks and breathing Compress Tx Pressure Decompress Decompress periods Begins Reached Begins Ends (leave unused spaces blank) Chamber Pressure 1 ATA 2.0 ATA - - - - - - 2.0 ATA 1 ATA Clock Time (24 hr) 09:33 09:44 - - - - - - 11:15 11:25 Treatment Length: 112 (minutes) Treatment Segments: 4 Capillary Blood Glucose Pre Capillary Blood Glucose (mg/dl): Post Capillary Blood Glucose (mg/dl): Vital Signs Capillary Blood Glucose Reference Range: 80 - 120 mg / dl HBO Diabetic Blood Glucose Intervention Range: <131 mg/dl or >249 mg/dl Time Vitals Blood Respiratory Capillary Blood Glucose Pulse Action Type: Pulse: Temperature: Taken: Pressure: Rate: Glucose (mg/dl): Meter #: Oximetry (%) Taken: Pre 09:20 104/56 90 18 98 288 1 none Post 11:28 124/70 78 18 98.4 405 1 NONE Treatment Response Well KIARALEE, VALDIVIEZO ([id number]) Treatment Toleration: Treatment Treatment Completed without Adverse Event Completion Status: HBO  Attestation I certify that I supervised this HBO treatment in accordance with Medicare guidelines. A trained Yes emergency response team is readily available per hospital policies and procedures. Continue HBOT as ordered. Yes Electronic Signature(s) Signed: 05/16/2015 11:53:30 AM By: Christin Fudge MD, FACS Entered By: Christin Fudge on 05/16/2015 11:53:30 [name] ([id number]) -------------------------------------------------------------------------------- HBO Safety Checklist Details Patient Name: [name] Date of Service: 05/16/2015 10:00 AM Medical Record Number: [id number] Patient Account Number: [id number] Date of Birth/Sex: 06/23/1956 (59 y.o. Female) Treating RN: Primary Care Physician: [name] Other Clinician: Jacqulyn Bath Referring Physician: [name] Treating Physician/Extender: Frann Rider in Treatment: 18 HBO Safety Checklist Items Safety Checklist Consent Form Signed Patient voided / foley secured and emptied When did you last eato 08:00 am Last dose of injectable or oral agent 08:00 am Ostomy pouch emptied and vented if applicable n/a All implantable devices assessed, documented and approved ICD Intravenous access site secured and place n/a Valuables secured Linens and cotton and cotton/polyester blend (less than 51% polyester) Personal oil-based products / skin lotions / body lotions removed Wigs or hairpieces removed Smoking or tobacco materials removed Books / newspapers / magazines / loose paper removed Cologne, aftershave, perfume and deodorant removed Jewelry removed (may wrap wedding band) Make-up removed Hair care products removed Battery operated devices (external) removed Heating patches and chemical warmers removed Titanium eyewear removed n/a Nail polish cured greater than 10 hours n/a Casting material cured greater than 10 hours n/a Hearing aids removed n/a Loose dentures or partials removed  n/a Prosthetics have been removed n/a Patient demonstrates correct use of air break device (if applicable) Patient concerns have been addressed Patient grounding bracelet on and cord attached to chamber Specifics for Inpatients (complete in addition to above) Medication sheet sent with patient Intravenous medications needed or due during therapy sent with patient  [name], [name] ([id number]) Drainage tubes (e.g. nasogastric tube or chest tube secured and vented) Endotracheal or Tracheotomy tube secured Cuff deflated of air and inflated with saline Airway suctioned Electronic Signature(s) Signed: 05/16/2015 4:46:37 PM By: Lorine Bears RCP, RRT, CHT Entered By: Becky Sax, Amado Nash on 05/16/2015 09:27:32

## 2015-05-17 NOTE — Progress Notes (Signed)
[name], [name] ([id number]) Visit Report for 05/16/2015 Arrival Information Details Patient Name: [name], [name] Date of Service: 05/16/2015 10:00 AM Medical Record Number: [id number] Patient Account Number: [id number] Date of Birth/Sex: 1956/01/13 (59 y.o. Female) Treating RN: Primary Care Physician: [name] Other Clinician: Jacqulyn Bath Referring Physician: [name] Treating Physician/Extender: Frann Rider in Treatment: 69 Visit Information History Since Last Visit Added or deleted any medications: No Patient Arrived: Wheel Chair Any new allergies or adverse reactions: No Arrival Time: 09:15 Had a fall or experienced change in No Accompanied By: husband activities of daily living that may affect Transfer Assistance: Manual risk of falls: Patient Identification Verified: Yes Signs or symptoms of abuse/neglect No Secondary Verification Process Yes since last visito Completed: Hospitalized since last visit: No Patient Requires Transmission- No Has Dressing in Place as Prescribed: Yes Based Precautions: Has Footwear/Offloading in Place as Yes Patient Has Alerts: Yes Prescribed: Patient Alerts: Patient on Blood Left: Surgical Shoe with Thinner Pressure Relief ABI L:0.8, Insole R:0.93 Right: Surgical Shoe with Pressure Relief Insole Pain Present Now: No Electronic Signature(s) Signed: 05/16/2015 4:46:37 PM By: Lorine Bears RCP, RRT, CHT Entered By: Lorine Bears on 05/16/2015 09:26:08 [name] ([id number]) -------------------------------------------------------------------------------- Encounter Discharge Information Details Patient Name: [name] Date of Service: 05/16/2015 10:00 AM Medical Record Number: [id number] Patient Account Number: [id number] Date of Birth/Sex: April 09, 1956 (59 y.o. Female) Treating RN: Primary Care Physician: [name] Other Clinician: Jacqulyn Bath Referring  Physician: [name] Treating Physician/Extender: Frann Rider in Treatment: 75 Encounter Discharge Information Items Discharge Pain Level: 0 Discharge Condition: Stable Ambulatory Status: Wheelchair Discharge Destination: Home Private Transportation: Auto Accompanied By: husband Schedule Follow-up Appointment: No Medication Reconciliation completed and No provided to Patient/Care Nataly Pacifico: Clinical Summary of Care: Notes Patient has an HBO treatment scheduled on 05/17/15 at 10:00 am. Electronic Signature(s) Signed: 05/16/2015 4:46:37 PM By: Lorine Bears RCP, RRT, CHT Entered By: Lorine Bears on 05/16/2015 11:39:27 [name] ([id number]) -------------------------------------------------------------------------------- Vitals Details Patient Name: [name] Date of Service: 05/16/2015 10:00 AM Medical Record Number: [id number] Patient Account Number: [id number] Date of Birth/Sex: 28-Jul-1956 (59 y.o. Female) Treating RN: Primary Care Physician: [name] Other Clinician: Jacqulyn Bath Referring Physician: [name] Treating Physician/Extender: Frann Rider in Treatment: 18 Vital Signs Time Taken: 09:20 Temperature (F): 98.0 Height (in): 68 Pulse (bpm): 90 Weight (lbs): 257 Respiratory Rate (breaths/min): 18 Body Mass Index (BMI): 39.1 Blood Pressure (mmHg): 104/56 Capillary Blood Glucose (mg/dl): 288 Reference Range: 80 - 120 mg / dl Electronic Signature(s) Signed: 05/16/2015 4:46:37 PM By: Lorine Bears RCP, RRT, CHT Entered By: Lorine Bears on 05/16/2015 09:26:33

## 2015-05-17 NOTE — Progress Notes (Signed)
ALASHA, MCKEAGUE ([id number]) Visit Report for 05/17/2015 Arrival Information Details Patient Name: [name], [name] Date of Service: 05/17/2015 10:00 AM Medical Record Number: [id number] Patient Account Number: [id number] Date of Birth/Sex: Oct 06, 1956 (59 y.o. Female) Treating RN: Primary Care Physician: [name] Other Clinician: Jacqulyn Bath Referring Physician: [name] Treating Physician/Extender: BURNS III, Charlean Sanfilippo in Treatment: 18 Visit Information History Since Last Visit Added or deleted any medications: No Patient Arrived: Wheel Chair Any new allergies or adverse reactions: No Arrival Time: 09:10 Had a fall or experienced change in No Accompanied By: husband activities of daily living that may affect Transfer Assistance: Manual risk of falls: Patient Identification Verified: Yes Signs or symptoms of abuse/neglect No Secondary Verification Process Yes since last visito Completed: Hospitalized since last visit: No Patient Requires Transmission- No Has Dressing in Place as Prescribed: Yes Based Precautions: Has Footwear/Offloading in Place as Yes Patient Has Alerts: Yes Prescribed: Patient Alerts: Patient on Blood Left: Surgical Shoe with Thinner Pressure Relief ABI L:0.8, Insole R:0.93 Right: Surgical Shoe with Pressure Relief Insole Pain Present Now: No Electronic Signature(s) Signed: 05/17/2015 1:51:04 PM By: Lorine Bears RCP, RRT, CHT Entered By: Lorine Bears on 05/17/2015 09:17:15 [name] ([id number]) -------------------------------------------------------------------------------- Encounter Discharge Information Details Patient Name: [name] Date of Service: 05/17/2015 10:00 AM Medical Record Number: [id number] Patient Account Number: [id number] Date of Birth/Sex: 04-02-1956 (59 y.o. Female) Treating RN: Primary Care Physician: [name] Other Clinician: Jacqulyn Bath Referring Physician: [name] Treating Physician/Extender: BURNS III, Charlean Sanfilippo in Treatment: 18 Encounter Discharge Information Items Discharge Pain Level: 0 Discharge Condition: Stable Ambulatory Status: Wheelchair Discharge Destination: Home Private Transportation: Auto Accompanied By: husband Schedule Follow-up Appointment: No Medication Reconciliation completed and No provided to Patient/Care Yaacov Koziol: Clinical Summary of Care: Notes Patient has an HBO treatment scheduled on 05/18/15 at 10:00 am. Electronic Signature(s) Signed: 05/17/2015 1:51:04 PM By: Lorine Bears RCP, RRT, CHT Entered By: Lorine Bears on 05/17/2015 11:28:04 [name] ([id number]) -------------------------------------------------------------------------------- Vitals Details Patient Name: [name] Date of Service: 05/17/2015 10:00 AM Medical Record Number: [id number] Patient Account Number: [id number] Date of Birth/Sex: [birth year] (59 y.o. Female) Treating RN: Primary Care Physician: [name] Other Clinician: Jacqulyn Bath Referring Physician: [name] Treating Physician/Extender: BURNS III, Charlean Sanfilippo in Treatment: 18 Vital Signs Time Taken: 09:11 Temperature (F): 97.7 Height (in): 68 Pulse (bpm): 78 Weight (lbs): 257 Respiratory Rate (breaths/min): 18 Body Mass Index (BMI): 39.1 Blood Pressure (mmHg): 100/58 Capillary Blood Glucose (mg/dl): 252 Reference Range: 80 - 120 mg / dl Electronic Signature(s) Signed: 05/17/2015 1:51:04 PM By: Lorine Bears RCP, RRT, CHT Entered By: Lorine Bears on 05/17/2015 09:17:38

## 2015-05-18 ENCOUNTER — Encounter: Payer: PPO | Admitting: Surgery

## 2015-05-18 DIAGNOSIS — L97512 Non-pressure chronic ulcer of other part of right foot with fat layer exposed: Secondary | ICD-10-CM | POA: Diagnosis not present

## 2015-05-18 LAB — GLUCOSE, CAPILLARY
GLUCOSE-CAPILLARY: 203 mg/dL — AB (ref 65–99)
Glucose-Capillary: 230 mg/dL — ABNORMAL HIGH (ref 65–99)

## 2015-05-18 NOTE — Progress Notes (Signed)
NEJLA, TROJAN ([id number]) Visit Report for 05/17/2015 HBO Details Patient Name: [name], [name] Date of Service: 05/17/2015 10:00 AM Medical Record Number: [id number] Patient Account Number: [id number] Date of Birth/Sex: 1955-10-17 (59 y.o. 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 HBO Treatment Details Treatment Number: 21 Patient Type: Outpatient Chamber Type: Monoplace Chamber #: HBO [id number] Treatment Protocol: 2.0 ATA with 90 minutes oxygen, and no air breaks Treatment Details Compression Rate Down: 1.5 psi / minute De-Compression Rate Up: 1.5 psi / minute Air breaks and breathing Compress Tx Pressure Decompress Decompress periods Begins Reached Begins Ends (leave unused spaces blank) Chamber Pressure 1 ATA 2.0 ATA - - - - - - 2.0 ATA 1 ATA Clock Time (24 hr) 09:23 09:33 - - - - - - 11:04 11:14 Treatment Length: 111 (minutes) Treatment Segments: 4 Capillary Blood Glucose Pre Capillary Blood Glucose (mg/dl): Post Capillary Blood Glucose (mg/dl): Vital Signs Capillary Blood Glucose Reference Range: 80 - 120 mg / dl HBO Diabetic Blood Glucose Intervention Range: <131 mg/dl or >249 mg/dl Time Vitals Blood Respiratory Capillary Blood Glucose Pulse Action Type: Pulse: Temperature: Taken: Pressure: Rate: Glucose (mg/dl): Meter #: Oximetry (%) Taken: Pre 09:11 100/58 78 18 97.7 252 1 none Post 11:17 122/74 78 18 97.8 215 1 none Treatment Response Well [name], [name] ([id number]) Treatment Toleration: Treatment Treatment Completed without Adverse Event Completion Status: HBO  Attestation I certify that I supervised this HBO treatment in accordance with Medicare guidelines. A trained Yes emergency response team is readily available per hospital policies and procedures. Continue HBOT as ordered. Yes Electronic Signature(s) Signed: 05/17/2015 2:26:54 PM By: Loletha Grayer MD Entered By: Loletha Grayer on 05/17/2015 12:07:48 [name] ([id number]) -------------------------------------------------------------------------------- HBO Safety Checklist Details Patient Name: [name] Date of Service: 05/17/2015 10:00 AM Medical Record Number: [id number] Patient Account Number: [id number] Date of Birth/Sex: 1956-06-03 (59 y.o. Female) Treating RN: Primary Care Physician: [name] Other Clinician: Jacqulyn Bath Referring Physician: [name] Treating Physician/Extender: BURNS III, Charlean Sanfilippo in Treatment: 18 HBO Safety Checklist Items Safety Checklist Consent Form Signed Patient voided / foley secured and emptied When did you last eato 21:00 pm 05/16/15 Last dose of injectable or oral agent 08:15 am Ostomy pouch emptied and vented if applicable n/a All implantable devices assessed, documented and approved ICD Intravenous access site secured and place n/a Valuables secured Linens and cotton and cotton/polyester blend (less than 51% polyester) Personal oil-based products / skin lotions / body lotions removed Wigs or hairpieces removed Smoking or tobacco materials removed Books / newspapers / magazines / loose paper removed Cologne, aftershave, perfume and deodorant removed Jewelry removed (may wrap wedding band) Make-up removed Hair care products removed Battery operated devices (external) removed Heating patches and chemical warmers removed Titanium eyewear removed n/a Nail polish cured greater than 10 hours n/a Casting material cured greater than 10 hours n/a Hearing aids removed n/a Loose dentures or partials removed  n/a Prosthetics have been removed n/a Patient demonstrates correct use of air break device (if applicable) Patient concerns have been addressed Patient grounding bracelet on and cord attached to chamber Specifics for Inpatients (complete in addition to above) Medication sheet sent with patient Intravenous medications needed or due during  therapy sent with patient [name], [name] ([id number]) Drainage tubes (e.g. nasogastric tube or chest tube secured and vented) Endotracheal or Tracheotomy tube secured Cuff deflated of air and inflated with saline Airway suctioned Electronic Signature(s) Signed: 05/17/2015 1:51:04 PM By: Lorine Bears RCP, RRT, CHT Entered By: Lorine Bears on 05/17/2015 09:24:52

## 2015-05-18 NOTE — Progress Notes (Addendum)
nutritional supplements and/or vitamins as prescribed Date Initiated: 01/10/2015 Goal Status: Active Interventions: Assess HgA1c results as ordered upon admission and as needed Treatment  Activities: Patient referred to Primary Care Physician for further nutritional evaluation : 05/18/2015 Notes: Orientation to the Wound Care Program Nursing Diagnoses: Knowledge deficit related to the wound healing center program Goals: Patient/caregiver will verbalize understanding of the [facility] Date Initiated: 01/10/2015 Goal Status: Active Interventions: Provide education on orientation to the wound center Notes: Osteomyelitis Nursing Diagnoses: Potential for infection: osteomyelitis Goals: Diagnostic evaluation for osteomyelitis completed as ordered Date Initiated: 01/17/2015 Goal Status: Active Patient/caregiver will verbalize understanding of disease process and disease management Date Initiated: 01/17/2015 Goal Status: Active Signs and symptoms for osteomyelitis will be recognized and promptly addressed [name], [name] ([id number]) Date Initiated: 01/17/2015 Goal Status: Active Interventions: Assess for signs and symptoms of osteomyelitis resolution every visit Provide education on osteomyelitis Treatment Activities: MRI : 05/18/2015 Test ordered outside of clinic : 05/18/2015 X-ray : 05/18/2015 Notes: Soft Tissue Infection Nursing Diagnoses: Knowledge deficit related to disease process and management Potential for infection: soft tissue Goals: Patient/caregiver will verbalize understanding of or measures to prevent infection and contamination in the home setting Date Initiated: 01/17/2015 Goal Status: Active Patient's soft tissue infection will resolve Date Initiated: 01/17/2015 Goal Status: Active Signs and symptoms of infection will be recognized early to allow for prompt treatment Date Initiated: 01/17/2015 Goal Status: Active Interventions: Assess signs and symptoms of infection every visit Provide education on infection Screen for HBO Treatment Activities: Culture : 05/18/2015 Culture and sensitivity : 05/18/2015 Education provided on  Infection : 04/20/2015 Systemic antibiotics : 05/18/2015 Test ordered outside of clinic : 05/18/2015 Notes: [name], [name] ([id number]) Wound/Skin Impairment Nursing Diagnoses: Impaired tissue integrity Knowledge deficit related to ulceration/compromised skin integrity Goals: Patient/caregiver will verbalize understanding of skin care regimen Date Initiated: 01/10/2015 Goal Status: Active Ulcer/skin breakdown will have a volume reduction of 30% by week 4 Date Initiated: 01/10/2015 Goal Status: Active Ulcer/skin breakdown will have a volume reduction of 50% by week 8 Date Initiated: 01/10/2015 Goal Status: Active Ulcer/skin breakdown will have a volume reduction of 80% by week 12 Date Initiated: 01/10/2015 Goal Status: Active Ulcer/skin breakdown will heal within 14 weeks Date Initiated: 01/10/2015 Goal Status: Active Interventions: Assess patient/caregiver ability to obtain necessary supplies Assess patient/caregiver ability to perform ulcer/skin care regimen upon admission and as needed Assess ulceration(s) every visit Provide education on smoking Provide education on ulcer and skin care Treatment Activities: Consult for HBO : 05/18/2015 Skin care regimen initiated : 05/18/2015 Topical wound management initiated : 05/18/2015 Notes: Electronic Signature(s) Signed: 05/18/2015 9:16:13 AM By: Regan Lemming BSN, RN Entered By: Regan Lemming on 05/18/2015 09:16:13 [name] ([id number]) -------------------------------------------------------------------------------- Pain Assessment Details Patient Name: [name] Date of Service: 05/18/2015 8:45 AM Medical Record Number: [id number] Patient Account Number: [id number] Date of Birth/Sex: December 04, 1955 (59 y.o. Female) Treating RN: Baruch Gouty, RN, BSN, Velva Harman Primary Care Physician: [name] Other Clinician: Referring Physician: [name] Treating Physician/Extender: Frann Rider in Treatment: 18 Active  Problems Location of Pain Severity and Description of Pain Patient Has Paino No Site Locations Pain Management and Medication Current Pain Management: Electronic Signature(s) Signed: 05/18/2015 9:01:41 AM By: Regan Lemming BSN, RN Entered By: Regan Lemming on 05/18/2015 09:01:41 [name] ([id number]) -------------------------------------------------------------------------------- Patient/Caregiver Education Details Patient Name: [name] Date of Service: 05/18/2015 8:45 AM Medical Record Number: [id number] Patient Account Number: [id number] Date of Birth/Gender: 07-26-1956 (59 y.o. Female) Treating  RN: Baruch Gouty, RN, BSN, Velva Harman Primary Care Physician: [name] Other Clinician: Referring Physician: [name] Treating Physician/Extender: Frann Rider in Treatment: 85 Education Assessment Education Provided To: Patient Education Topics Provided Basic Hygiene: Methods: Explain/Verbal Responses: State content correctly Infection: Methods: Explain/Verbal Responses: State content correctly Safety: Methods: Explain/Verbal Electronic Signature(s) Signed: 05/18/2015 9:34:46 AM By: Regan Lemming BSN, RN Entered By: Regan Lemming on 05/18/2015 09:34:46 [name] ([id number]) -------------------------------------------------------------------------------- Wound Assessment Details Patient Name: [name] Date of Service: 05/18/2015 8:45 AM Medical Record Number: [id number] Patient Account Number: [id number] Date of Birth/Sex: 1955-12-03 (59 y.o. Female) Treating RN: Afful, RN, BSN, [place] Primary Care Physician: [name] Other Clinician: Referring Physician: [name] Treating Physician/Extender: Frann Rider in Treatment: 18 Wound Status Wound Number: 3 Primary Pressure Ulcer Etiology: Wound Location: Nose Wound Healed - Epithelialized Wounding Event: Pressure Injury Status: Date Acquired: 11/07/2014 Comorbid Anemia,  Arrhythmia, Congestive Heart Weeks Of Treatment: 17 History: Failure, Hypertension, Type II Diabetes Clustered Wound: No Photos Photo Uploaded By: Regan Lemming on 05/18/2015 17:16:44 Wound Measurements Length: (cm) 0 % Reduction i Width: (cm) 0 % Reduction i Depth: (cm) 0 Epithelializa Area: (cm) 0 Tunneling: Volume: (cm) 0 Undermining: n Area: 100% n Volume: 100% tion: Large (67-100%) No No Wound Description Classification: Category/Stage III Wound Margin: Flat and Intact Exudate Amount: None Present Foul Odor After Cleansing: No Wound Bed Granulation Amount: Large (67-100%) Exposed Structure Granulation Quality: Pink Fascia Exposed: No Necrotic Amount: None Present (0%) Fat Layer Exposed: No Tendon Exposed: No Muscle Exposed: No Joint Exposed: No [name], [name] ([id number]) Bone Exposed: No Limited to Skin Breakdown Periwound Skin Texture Texture Color No Abnormalities Noted: No No Abnormalities Noted: No Callus: No Atrophie Blanche: No Crepitus: No Cyanosis: No Excoriation: No Ecchymosis: No Fluctuance: No Erythema: No Friable: No Hemosiderin Staining: No Induration: No Mottled: No Localized Edema: No Pallor: No Rash: No Rubor: No Scarring: No Temperature / Pain Moisture Temperature: No Abnormality No Abnormalities Noted: No Dry / Scaly: No Maceration: No Moist: No Wound Preparation Ulcer Cleansing: Rinsed/Irrigated with Saline Topical Anesthetic Applied: None Electronic Signature(s) Signed: 05/18/2015 4:29:43 PM By: Regan Lemming BSN, RN Previous Signature: 05/18/2015 9:10:42 AM Version By: Regan Lemming BSN, RN Entered By: Regan Lemming on 05/18/2015 09:22:53 [name] ([id number]) -------------------------------------------------------------------------------- Wound Assessment Details Patient Name: [name] Date of Service: 05/18/2015 8:45 AM Medical Record Number: [id number] Patient Account Number: [id number] Date of  Birth/Sex: 06/05/1956 (59 y.o. Female) Treating RN: Afful, RN, BSN, [place] Primary Care Physician: [name] Other Clinician: Referring Physician: [name] Treating Physician/Extender: Frann Rider in Treatment: 18 Wound Status Wound Number: 4 Primary Pressure Ulcer Etiology: Wound Location: Coccyx - Medial Wound Open Wounding Event: Pressure Injury Status: Date Acquired: 02/21/2015 Comorbid Anemia, Arrhythmia, Congestive Heart Weeks Of Treatment: 10 History: Failure, Hypertension, Type II Diabetes Clustered Wound: No Photos Photo Uploaded By: Regan Lemming on 05/18/2015 17:16:44 Wound Measurements Length: (cm) 0.5 Width: (cm) 0.2 Depth: (cm) 0.1 Area: (cm) 0.079 Volume: (cm) 0.008 % Reduction in Area: 98.5% % Reduction in Volume: 98.5% Epithelialization: Large (67-100%) Tunneling: No Undermining: No Wound Description Classification: Category/Stage III Wound Margin: Flat and Intact Exudate Amount: Small Exudate Type: Serous Exudate Color: amber Foul Odor After Cleansing: No Wound Bed Granulation Amount: Large (67-100%) Exposed Structure Granulation Quality: Red, Pink Fascia Exposed: No Necrotic Amount: None Present (0%) Fat Layer Exposed: No Tendon Exposed: No [name], Vickie S. ([id number]) Muscle Exposed: No Joint Exposed: No Bone Exposed: No Limited to Skin  RN: Baruch Gouty, RN, BSN, Velva Harman Primary Care Physician: [name] Other Clinician: Referring Physician: [name] Treating Physician/Extender: Frann Rider in Treatment: 85 Education Assessment Education Provided To: Patient Education Topics Provided Basic Hygiene: Methods: Explain/Verbal Responses: State content correctly Infection: Methods: Explain/Verbal Responses: State content correctly Safety: Methods: Explain/Verbal Electronic Signature(s) Signed: 05/18/2015 9:34:46 AM By: Regan Lemming BSN, RN Entered By: Regan Lemming on 05/18/2015 09:34:46 [name] ([id number]) -------------------------------------------------------------------------------- Wound Assessment Details Patient Name: [name] Date of Service: 05/18/2015 8:45 AM Medical Record Number: [id number] Patient Account Number: [id number] Date of Birth/Sex: 1955-12-03 (59 y.o. Female) Treating RN: Afful, RN, BSN, [place] Primary Care Physician: [name] Other Clinician: Referring Physician: [name] Treating Physician/Extender: Frann Rider in Treatment: 18 Wound Status Wound Number: 3 Primary Pressure Ulcer Etiology: Wound Location: Nose Wound Healed - Epithelialized Wounding Event: Pressure Injury Status: Date Acquired: 11/07/2014 Comorbid Anemia,  Arrhythmia, Congestive Heart Weeks Of Treatment: 17 History: Failure, Hypertension, Type II Diabetes Clustered Wound: No Photos Photo Uploaded By: Regan Lemming on 05/18/2015 17:16:44 Wound Measurements Length: (cm) 0 % Reduction i Width: (cm) 0 % Reduction i Depth: (cm) 0 Epithelializa Area: (cm) 0 Tunneling: Volume: (cm) 0 Undermining: n Area: 100% n Volume: 100% tion: Large (67-100%) No No Wound Description Classification: Category/Stage III Wound Margin: Flat and Intact Exudate Amount: None Present Foul Odor After Cleansing: No Wound Bed Granulation Amount: Large (67-100%) Exposed Structure Granulation Quality: Pink Fascia Exposed: No Necrotic Amount: None Present (0%) Fat Layer Exposed: No Tendon Exposed: No Muscle Exposed: No Joint Exposed: No [name], [name] ([id number]) Bone Exposed: No Limited to Skin Breakdown Periwound Skin Texture Texture Color No Abnormalities Noted: No No Abnormalities Noted: No Callus: No Atrophie Blanche: No Crepitus: No Cyanosis: No Excoriation: No Ecchymosis: No Fluctuance: No Erythema: No Friable: No Hemosiderin Staining: No Induration: No Mottled: No Localized Edema: No Pallor: No Rash: No Rubor: No Scarring: No Temperature / Pain Moisture Temperature: No Abnormality No Abnormalities Noted: No Dry / Scaly: No Maceration: No Moist: No Wound Preparation Ulcer Cleansing: Rinsed/Irrigated with Saline Topical Anesthetic Applied: None Electronic Signature(s) Signed: 05/18/2015 4:29:43 PM By: Regan Lemming BSN, RN Previous Signature: 05/18/2015 9:10:42 AM Version By: Regan Lemming BSN, RN Entered By: Regan Lemming on 05/18/2015 09:22:53 [name] ([id number]) -------------------------------------------------------------------------------- Wound Assessment Details Patient Name: [name] Date of Service: 05/18/2015 8:45 AM Medical Record Number: [id number] Patient Account Number: [id number] Date of  Birth/Sex: 06/05/1956 (59 y.o. Female) Treating RN: Afful, RN, BSN, [place] Primary Care Physician: [name] Other Clinician: Referring Physician: [name] Treating Physician/Extender: Frann Rider in Treatment: 18 Wound Status Wound Number: 4 Primary Pressure Ulcer Etiology: Wound Location: Coccyx - Medial Wound Open Wounding Event: Pressure Injury Status: Date Acquired: 02/21/2015 Comorbid Anemia, Arrhythmia, Congestive Heart Weeks Of Treatment: 10 History: Failure, Hypertension, Type II Diabetes Clustered Wound: No Photos Photo Uploaded By: Regan Lemming on 05/18/2015 17:16:44 Wound Measurements Length: (cm) 0.5 Width: (cm) 0.2 Depth: (cm) 0.1 Area: (cm) 0.079 Volume: (cm) 0.008 % Reduction in Area: 98.5% % Reduction in Volume: 98.5% Epithelialization: Large (67-100%) Tunneling: No Undermining: No Wound Description Classification: Category/Stage III Wound Margin: Flat and Intact Exudate Amount: Small Exudate Type: Serous Exudate Color: amber Foul Odor After Cleansing: No Wound Bed Granulation Amount: Large (67-100%) Exposed Structure Granulation Quality: Red, Pink Fascia Exposed: No Necrotic Amount: None Present (0%) Fat Layer Exposed: No Tendon Exposed: No [name], Vickie S. ([id number]) Muscle Exposed: No Joint Exposed: No Bone Exposed: No Limited to Skin  nutritional supplements and/or vitamins as prescribed Date Initiated: 01/10/2015 Goal Status: Active Interventions: Assess HgA1c results as ordered upon admission and as needed Treatment  Activities: Patient referred to Primary Care Physician for further nutritional evaluation : 05/18/2015 Notes: Orientation to the Wound Care Program Nursing Diagnoses: Knowledge deficit related to the wound healing center program Goals: Patient/caregiver will verbalize understanding of the [facility] Program Date Initiated: 01/10/2015 Goal Status: Active Interventions: Provide education on orientation to the wound center Notes: Osteomyelitis Nursing Diagnoses: Potential for infection: osteomyelitis Goals: Diagnostic evaluation for osteomyelitis completed as ordered Date Initiated: 01/17/2015 Goal Status: Active Patient/caregiver will verbalize understanding of disease process and disease management Date Initiated: 01/17/2015 Goal Status: Active Signs and symptoms for osteomyelitis will be recognized and promptly addressed [name], [name] ([id number]) Date Initiated: 01/17/2015 Goal Status: Active Interventions: Assess for signs and symptoms of osteomyelitis resolution every visit Provide education on osteomyelitis Treatment Activities: MRI : 05/18/2015 Test ordered outside of clinic : 05/18/2015 X-ray : 05/18/2015 Notes: Soft Tissue Infection Nursing Diagnoses: Knowledge deficit related to disease process and management Potential for infection: soft tissue Goals: Patient/caregiver will verbalize understanding of or measures to prevent infection and contamination in the home setting Date Initiated: 01/17/2015 Goal Status: Active Patient's soft tissue infection will resolve Date Initiated: 01/17/2015 Goal Status: Active Signs and symptoms of infection will be recognized early to allow for prompt treatment Date Initiated: 01/17/2015 Goal Status: Active Interventions: Assess signs and symptoms of infection every visit Provide education on infection Screen for HBO Treatment Activities: Culture : 05/18/2015 Culture and sensitivity : 05/18/2015 Education provided on  Infection : 04/20/2015 Systemic antibiotics : 05/18/2015 Test ordered outside of clinic : 05/18/2015 Notes: [name], [name] ([id number]) Wound/Skin Impairment Nursing Diagnoses: Impaired tissue integrity Knowledge deficit related to ulceration/compromised skin integrity Goals: Patient/caregiver will verbalize understanding of skin care regimen Date Initiated: 01/10/2015 Goal Status: Active Ulcer/skin breakdown will have a volume reduction of 30% by week 4 Date Initiated: 01/10/2015 Goal Status: Active Ulcer/skin breakdown will have a volume reduction of 50% by week 8 Date Initiated: 01/10/2015 Goal Status: Active Ulcer/skin breakdown will have a volume reduction of 80% by week 12 Date Initiated: 01/10/2015 Goal Status: Active Ulcer/skin breakdown will heal within 14 weeks Date Initiated: 01/10/2015 Goal Status: Active Interventions: Assess patient/caregiver ability to obtain necessary supplies Assess patient/caregiver ability to perform ulcer/skin care regimen upon admission and as needed Assess ulceration(s) every visit Provide education on smoking Provide education on ulcer and skin care Treatment Activities: Consult for HBO : 05/18/2015 Skin care regimen initiated : 05/18/2015 Topical wound management initiated : 05/18/2015 Notes: Electronic Signature(s) Signed: 05/18/2015 9:16:13 AM By: Regan Lemming BSN, RN Entered By: Regan Lemming on 05/18/2015 09:16:13 [name] ([id number]) -------------------------------------------------------------------------------- Pain Assessment Details Patient Name: [name] Date of Service: 05/18/2015 8:45 AM Medical Record Number: [id number] Patient Account Number: [id number] Date of Birth/Sex: December 04, 1955 (59 y.o. Female) Treating RN: Baruch Gouty, RN, BSN, Velva Harman Primary Care Physician: [name] Other Clinician: Referring Physician: [name] Treating Physician/Extender: Frann Rider in Treatment: 18 Active  Problems Location of Pain Severity and Description of Pain Patient Has Paino No Site Locations Pain Management and Medication Current Pain Management: Electronic Signature(s) Signed: 05/18/2015 9:01:41 AM By: Regan Lemming BSN, RN Entered By: Regan Lemming on 05/18/2015 09:01:41 [name] ([id number]) -------------------------------------------------------------------------------- Patient/Caregiver Education Details Patient Name: [name] Date of Service: 05/18/2015 8:45 AM Medical Record Number: [id number] Patient Account Number: [id number] Date of Birth/Gender: 07-26-1956 (59 y.o. Female) Treating  Breakdown Periwound Skin Texture Texture Color No Abnormalities Noted: No No Abnormalities Noted: No Callus: No Atrophie Blanche: No Crepitus: No Cyanosis: No Excoriation: No Ecchymosis: No Fluctuance: No Erythema: No Friable: No Hemosiderin Staining: No Induration: No Mottled: No Localized Edema: No Pallor: No Rash: No Rubor: No Scarring: No Temperature / Pain Moisture Temperature: No Abnormality No Abnormalities Noted: No Dry / Scaly: No Maceration: No Moist: Yes Wound Preparation Ulcer Cleansing: Rinsed/Irrigated with Saline Topical Anesthetic Applied:  None Treatment Notes Wound #4 (Medial Coccyx) 1. Cleansed with: May Shower, gently pat wound dry prior to applying new dressing. 2. Anesthetic Topical Lidocaine 4% cream to wound bed prior to debridement 4. Dressing Applied: Prisma Ag 5. Secondary Dressing Applied Bordered Foam Dressing Gauze and Kerlix/Conform Electronic Signature(s) Signed: 05/18/2015 9:11:35 AM By: Regan Lemming BSN, RN Entered By: Regan Lemming on 05/18/2015 09:11:35 [name] ([id number]) -------------------------------------------------------------------------------- Wound Assessment Details Patient Name: [name] Date of Service: 05/18/2015 8:45 AM Medical Record Number: [id number] Patient Account Number: [id number] Date of Birth/Sex: 1956/02/10 (59 y.o. Female) Treating RN: Afful, RN, BSN, [place] Primary Care Physician: [name] Other Clinician: Referring Physician: [name] Treating Physician/Extender: Frann Rider in Treatment: 18 Wound Status Wound Number: 7 Primary Diabetic Wound/Ulcer of the Lower Etiology: Extremity Wound Location: Right Toe Second Wound Open Wounding Event: Gradually Appeared Status: Date Acquired: 03/10/2015 Comorbid Anemia, Arrhythmia, Congestive Heart Weeks Of Treatment: 9 History: Failure, Hypertension, Type II Diabetes Clustered Wound: No Photos Photo Uploaded By: Regan Lemming on 05/18/2015 17:16:45 Wound Measurements Length: (cm) 0.6 Width: (cm) 1.3 Depth: (cm) 0.3 Area: (cm) 0.613 Volume: (cm) 0.184 % Reduction in Area: 25% % Reduction in Volume: -124.4% Epithelialization: Small (1-33%) Tunneling: No Undermining: No Wound Description Classification: Grade 1 Foul Odor Aft Wound Margin: Flat and Intact Exudate Amount: Small Exudate Type: Serous Exudate Color: amber er Cleansing: No Wound Bed Granulation Amount: Medium (34-66%) Exposed Structure Granulation Quality: Red, Pink Fascia Exposed: No Necrotic Amount: Medium  (34-66%) Fat Layer Exposed: No Necrotic Quality: Adherent Slough Tendon Exposed: No [name], FOR[place]. ([id number]) Muscle Exposed: No Joint Exposed: No Bone Exposed: No Limited to Skin Breakdown Periwound Skin Texture Texture Color No Abnormalities Noted: No No Abnormalities Noted: No Callus: Yes Atrophie Blanche: No Crepitus: No Cyanosis: No Excoriation: No Ecchymosis: No Fluctuance: No Erythema: Yes Friable: No Hemosiderin Staining: No Induration: No Mottled: No Localized Edema: No Pallor: No Rash: No Rubor: No Scarring: No Temperature / Pain Moisture Temperature: No Abnormality No Abnormalities Noted: No Dry / Scaly: No Maceration: No Moist: No Wound Preparation Ulcer Cleansing: Rinsed/Irrigated with Saline Topical Anesthetic Applied: Other: lidocaine 4%, Treatment Notes Wound #7 (Right Toe Second) 1. Cleansed with: May Shower, gently pat wound dry prior to applying new dressing. 2. Anesthetic Topical Lidocaine 4% cream to wound bed prior to debridement 4. Dressing Applied: Prisma Ag 5. Secondary Dressing Applied Bordered Foam Dressing Gauze and Kerlix/Conform Electronic Signature(s) Signed: 05/18/2015 4:29:43 PM By: Regan Lemming BSN, RN Previous Signature: 05/18/2015 9:11:56 AM Version By: Regan Lemming BSN, RN Entered By: Regan Lemming on 05/18/2015 09:21:35 [name] ([id number]) -------------------------------------------------------------------------------- [place] Details Patient Name: [name] Date of Service: 05/18/2015 8:45 AM Medical Record Number: [id number] Patient Account Number: [id number] Date of Birth/Sex: 28-Jul-1956 (59 y.o. Female) Treating RN: Baruch Gouty, RN, BSN, Velva Harman Primary Care Physician: [name] Other Clinician: Referring Physician: [name] Treating Physician/Extender: Frann Rider in Treatment: 18 Vital Signs Time Taken: 09:07 Temperature (F): 97.7 Height (in): 68 Pulse (bpm): 79 Weight (  nutritional supplements and/or vitamins as prescribed Date Initiated: 01/10/2015 Goal Status: Active Interventions: Assess HgA1c results as ordered upon admission and as needed Treatment  Activities: Patient referred to Primary Care Physician for further nutritional evaluation : 05/18/2015 Notes: Orientation to the Wound Care Program Nursing Diagnoses: Knowledge deficit related to the wound healing center program Goals: Patient/caregiver will verbalize understanding of the [facility] Program Date Initiated: 01/10/2015 Goal Status: Active Interventions: Provide education on orientation to the wound center Notes: Osteomyelitis Nursing Diagnoses: Potential for infection: osteomyelitis Goals: Diagnostic evaluation for osteomyelitis completed as ordered Date Initiated: 01/17/2015 Goal Status: Active Patient/caregiver will verbalize understanding of disease process and disease management Date Initiated: 01/17/2015 Goal Status: Active Signs and symptoms for osteomyelitis will be recognized and promptly addressed [name], [name] ([id number]) Date Initiated: 01/17/2015 Goal Status: Active Interventions: Assess for signs and symptoms of osteomyelitis resolution every visit Provide education on osteomyelitis Treatment Activities: MRI : 05/18/2015 Test ordered outside of clinic : 05/18/2015 X-ray : 05/18/2015 Notes: Soft Tissue Infection Nursing Diagnoses: Knowledge deficit related to disease process and management Potential for infection: soft tissue Goals: Patient/caregiver will verbalize understanding of or measures to prevent infection and contamination in the home setting Date Initiated: 01/17/2015 Goal Status: Active Patient's soft tissue infection will resolve Date Initiated: 01/17/2015 Goal Status: Active Signs and symptoms of infection will be recognized early to allow for prompt treatment Date Initiated: 01/17/2015 Goal Status: Active Interventions: Assess signs and symptoms of infection every visit Provide education on infection Screen for HBO Treatment Activities: Culture : 05/18/2015 Culture and sensitivity : 05/18/2015 Education provided on  Infection : 04/20/2015 Systemic antibiotics : 05/18/2015 Test ordered outside of clinic : 05/18/2015 Notes: [name], [name] ([id number]) Wound/Skin Impairment Nursing Diagnoses: Impaired tissue integrity Knowledge deficit related to ulceration/compromised skin integrity Goals: Patient/caregiver will verbalize understanding of skin care regimen Date Initiated: 01/10/2015 Goal Status: Active Ulcer/skin breakdown will have a volume reduction of 30% by week 4 Date Initiated: 01/10/2015 Goal Status: Active Ulcer/skin breakdown will have a volume reduction of 50% by week 8 Date Initiated: 01/10/2015 Goal Status: Active Ulcer/skin breakdown will have a volume reduction of 80% by week 12 Date Initiated: 01/10/2015 Goal Status: Active Ulcer/skin breakdown will heal within 14 weeks Date Initiated: 01/10/2015 Goal Status: Active Interventions: Assess patient/caregiver ability to obtain necessary supplies Assess patient/caregiver ability to perform ulcer/skin care regimen upon admission and as needed Assess ulceration(s) every visit Provide education on smoking Provide education on ulcer and skin care Treatment Activities: Consult for HBO : 05/18/2015 Skin care regimen initiated : 05/18/2015 Topical wound management initiated : 05/18/2015 Notes: Electronic Signature(s) Signed: 05/18/2015 9:16:13 AM By: Regan Lemming BSN, RN Entered By: Regan Lemming on 05/18/2015 09:16:13 [name] ([id number]) -------------------------------------------------------------------------------- Pain Assessment Details Patient Name: [name] Date of Service: 05/18/2015 8:45 AM Medical Record Number: [id number] Patient Account Number: [id number] Date of Birth/Sex: December 04, 1955 (59 y.o. Female) Treating RN: Baruch Gouty, RN, BSN, Velva Harman Primary Care Physician: [name] Other Clinician: Referring Physician: [name] Treating Physician/Extender: Frann Rider in Treatment: 18 Active  Problems Location of Pain Severity and Description of Pain Patient Has Paino No Site Locations Pain Management and Medication Current Pain Management: Electronic Signature(s) Signed: 05/18/2015 9:01:41 AM By: Regan Lemming BSN, RN Entered By: Regan Lemming on 05/18/2015 09:01:41 [name] ([id number]) -------------------------------------------------------------------------------- Patient/Caregiver Education Details Patient Name: [name] Date of Service: 05/18/2015 8:45 AM Medical Record Number: [id number] Patient Account Number: [id number] Date of Birth/Gender: 07-26-1956 (59 y.o. Female) Treating

## 2015-05-19 ENCOUNTER — Encounter: Payer: PPO | Admitting: Surgery

## 2015-05-19 DIAGNOSIS — L97512 Non-pressure chronic ulcer of other part of right foot with fat layer exposed: Secondary | ICD-10-CM | POA: Diagnosis not present

## 2015-05-19 LAB — GLUCOSE, CAPILLARY
GLUCOSE-CAPILLARY: 252 mg/dL — AB (ref 65–99)
Glucose-Capillary: 231 mg/dL — ABNORMAL HIGH (ref 65–99)

## 2015-05-19 NOTE — Progress Notes (Signed)
[name], [name] ([id number]) Visit Report for 05/18/2015 Arrival Information Details Patient Name: [name], [name] Date of Service: 05/18/2015 10:00 AM Medical Record Number: [id number] Patient Account Number: [id number] Date of Birth/Sex: Mar 13, 1956 (59 y.o. Female) Treating RN: Primary Care Physician: [name] Other Clinician: Jacqulyn Bath Referring Physician: [name] Treating Physician/Extender: Frann Rider in Treatment: 82 Visit Information History Since Last Visit Added or deleted any medications: No Patient Arrived: Wheel Chair Any new allergies or adverse reactions: No Arrival Time: 09:31 Had a fall or experienced change in No Accompanied By: husband activities of daily living that may affect Transfer Assistance: Manual risk of falls: Patient Identification Verified: Yes Signs or symptoms of abuse/neglect No Secondary Verification Process Yes since last visito Completed: Hospitalized since last visit: No Patient Requires Transmission- No Has Dressing in Place as Prescribed: Yes Based Precautions: Has Footwear/Offloading in Place as Yes Patient Has Alerts: Yes Prescribed: Patient Alerts: Patient on Blood Left: Surgical Shoe with Thinner Pressure Relief ABI L:0.8, Insole R:0.93 Right: Surgical Shoe with Pressure Relief Insole Pain Present Now: No Electronic Signature(s) Signed: 05/18/2015 1:51:17 PM By: Lorine Bears RCP, RRT, CHT Entered By: Lorine Bears on 05/18/2015 09:32:23 [name] ([id number]) -------------------------------------------------------------------------------- Encounter Discharge Information Details Patient Name: [name] Date of Service: 05/18/2015 10:00 AM Medical Record Number: [id number] Patient Account Number: [id number] Date of Birth/Sex: Oct 24, 1955 (59 y.o. Female) Treating RN: Primary Care Physician: [name] Other Clinician: Jacqulyn Bath Referring  Physician: [name] Treating Physician/Extender: Frann Rider in Treatment: 3 Encounter Discharge Information Items Discharge Pain Level: 0 Discharge Condition: Stable Ambulatory Status: Wheelchair Discharge Destination: Home Private Transportation: Auto Accompanied By: husband Schedule Follow-up Appointment: No Medication Reconciliation completed and No provided to Patient/Care Ethin Drummond: Clinical Summary of Care: Notes Patient has an HBO treatment scheduled on 05/19/15 at 10:00 am. Electronic Signature(s) Signed: 05/18/2015 1:51:17 PM By: Lorine Bears RCP, RRT, CHT Entered By: Lorine Bears on 05/18/2015 11:54:14 [name] ([id number]) -------------------------------------------------------------------------------- Vitals Details Patient Name: [name] Date of Service: 05/18/2015 10:00 AM Medical Record Number: [id number] Patient Account Number: [id number] Date of Birth/Sex: 05-26-1956 (59 y.o. Female) Treating RN: Primary Care Physician: [name] Other Clinician: Jacqulyn Bath Referring Physician: [name] Treating Physician/Extender: Frann Rider in Treatment: 18 Vital Signs Time Taken: 09:07 Temperature (F): 97.7 Height (in): 68 Pulse (bpm): 79 Weight (lbs): 257 Respiratory Rate (breaths/min): 18 Body Mass Index (BMI): 39.1 Blood Pressure (mmHg): 127/62 Capillary Blood Glucose (mg/dl): 230 Reference Range: 80 - 120 mg / dl Electronic Signature(s) Signed: 05/18/2015 1:51:17 PM By: Lorine Bears RCP, RRT, CHT Entered By: Becky Sax, Amado Nash on 05/18/2015 09:32:48

## 2015-05-19 NOTE — Progress Notes (Addendum)
under my care and that I had a face-to-face encounter that meets the physician face-to-face encounter requirements with this patient on this date. The encounter with the patient was in whole or in part for the following MEDICAL CONDITION: (primary reason for [place]) MEDICAL NECESSITY: I certify, that based on my findings, NURSING services are a medically necessary home health service. HOME BOUND STATUS: I certify that my clinical findings [name], [name] ([id number]) support that this patient is homebound (i.e., Due to illness or injury, pt requires aid of supportive devices such as crutches, cane, wheelchairs, walkers, the use of special transportation or the assistance of another person to leave their place of residence. There is a normal inability to leave the home and doing so requires considerable and taxing effort. Other absences are for medical reasons / religious services and are infrequent or of short duration when for other reasons). o If current dressing causes regression in wound condition, may D/C ordered dressing product/s and apply Normal Saline Moist Dressing daily until next [place] / Other MD appointment. [place] of regression in wound condition at [phone]. o Please direct any NON-WOUND related issues/requests for orders to patient's Primary Care Physician Electronic Signature(s) Signed: 05/18/2015 9:23:59 AM By: Regan Lemming BSN, RN Signed: 05/18/2015 1:29:04 PM By: Christin Fudge MD, FACS Entered By: Regan Lemming on 05/18/2015 09:23:58 [name] ([id number]) -------------------------------------------------------------------------------- Problem List Details Patient Name: [name] Date of Service:  05/18/2015 8:45 AM Medical Record Number: [id number] Patient Account Number: [id number] Date of Birth/Sex: June 24, 1956 (59 y.o. 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, left ankle and foot 02/13/2015 Yes 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, stage 3 03/03/2015 Yes L89.323 Pressure ulcer of left buttock, stage 3 03/03/2015 Yes L97.512 Non-pressure chronic ulcer of other part of right foot with 03/10/2015 Yes fat layer exposed Inactive Problems Resolved Problems ICD-10 Code Description Active Date Resolved Date L97.422 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 01/10/2015 01/10/2015 layer exposed [name], [name] ([id number]) [phone] Non-pressure chronic ulcer of other part of left foot with fat 01/10/2015 01/10/2015 layer exposed L02.611 Cutaneous abscess of right foot 04/20/2015 04/20/2015 Electronic Signature(s) Signed: 05/18/2015 9:26:35 AM By: Christin Fudge MD, FACS Entered By: Christin Fudge on 05/18/2015 09:26:35 [name] ([id number]) -------------------------------------------------------------------------------- Progress Note Details Patient Name: [name] Date of Service: 05/18/2015 8:45 AM Medical Record Number: [id number] Patient Account Number: [id number] Date of Birth/Sex: [birth year] (59 y.o. Female) Treating RN: Primary Care Physician: [name] Other Clinician: Referring Physician: [name] Treating Physician/Extender: Frann Rider in Treatment: 18 Subjective Chief Complaint Information obtained from Patient Patient presents to the wound care center for a consult due non healing wound 59 year old patient who comes with a history of an ulcer on the left second toe for at least about 4 months and a history on her left heel for about 3 weeks. I needed to get an MRI of her left foot but due to her defibrillator this could not be done and hence she is getting get a triple phase bone scan. 01/17/2015 -- she has developed a  when to start. She is also starting on oral ciprofloxacin. 03/03/2015 - recently hospitalized for anemia. No source identified. Started on amiodarone, contraindication to HBO. Reports left heel feels better. Main complaint is new buttocks ulcers. No fever or chills. Minimal drainage. 03/10/2015 -- There is a new ulceration noticed yesterday on her right second toe at the tip. She also has noticed some ulcerations on her gluteal region and we will need to take a look at this. As far as her medications go she does not know for sure what her dosage of amiodarone is exactly. She will look it up and let me know today. 03/17/2015 -- as far as her dosage of amiodarone she was taking : 02/25/15 - 03/02/15 400 mg per day; 03/03/15 - 03/10/15 200 mg per day. She stopped taking the medication on 03/10/15. She has not yet done the checks x-ray and her PFTs are still pending. Recent Lab work done on 03/13/2015 shows a glucose is 124, K+ was 3.3, her albumen was 2.9, WBC count was 3.4, hemoglobin was 9.3, hematocrit was 30.4 and platelets were 240. C-reactive protein was 33.7 which was high and the Vancomycin trough was 18.6 which was high. 03/24/2015 -- he saw several doctors since I saw her last and they include her medical oncologist, cardiologist, neurologist and infectious disease doctor. Her PICC line is out and she now is on oral Cipro and doxycycline as per her verbal report. She has completed her pulmonary function test yesterday and a recent chest x-ray is within normal limits. If the PFTs within normal limits she will be able to restart the hyperbaric oxygen therapy. 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 Underlying interstitial lung disease to be considered. Further recommendations were that of a CT chest and pulmonary consult  if indicated. 05/11/2015 -- due to some discomfort in her ears during hyperbaric oxygen therapy, she saw ENT yesterday who basically said everything was good and continue with hyperbaric therapy. She has minimal redness of her right second toe but she is still on ciprofloxacin. [name], [name] ([id number]) 05/18/2015 -- her nose is almost completely healed and so is the sacral area. She still has an open wound on the right second toe which needs further care. He has just completed a course of ciprofloxacin. Electronic Signature(s) Signed: 05/18/2015 9:27:26 AM By: Christin Fudge MD, FACS Entered By: Christin Fudge on 05/18/2015 09:27:26 [name] ([id number]) -------------------------------------------------------------------------------- Physical Exam Details Patient Name: [name] Date of Service: 05/18/2015 8:45 AM Medical Record Number: [id number] Patient Account Number: [id number] Date of Birth/Sex: 1956/06/08 (59 y.o. Female) Treating RN: Primary Care Physician: [name] Other Clinician: Referring Physician: [name] Treating Physician/Extender: Frann Rider in Treatment: 18 Constitutional . Pulse regular. Respirations normal and unlabored. Afebrile. . Eyes Nonicteric. Reactive to light. Ears, Nose, Mouth, and Throat Lips, teeth, and gums WNL.Marland Kitchen Moist mucosa without lesions . Neck supple and nontender. No palpable supraclavicular or cervical adenopathy. Normal sized without goiter. Respiratory WNL. No retractions.. Cardiovascular Pedal Pulses WNL. No clubbing, cyanosis or edema. Chest Breasts symmetical and no nipple discharge.. Breast tissue WNL, no masses, lumps, or tenderness.. Lymphatic No adneopathy. No adenopathy. No adenopathy. Musculoskeletal Adexa without tenderness or enlargement.. Digits and nails w/o clubbing, cyanosis, infection, petechiae, ischemia, or inflammatory conditions.. Integumentary (Hair, Skin) No suspicious  lesions. No crepitus or fluctuance. No peri-wound warmth or erythema. No masses.Marland Kitchen Psychiatric Judgement and insight  [name], [name] ([id number]) Visit Report for 05/18/2015 Chief Complaint Document Details Patient Name: [name], [name] Date of Service: 05/18/2015 8:45 AM Medical Record Number: [id number] Patient Account Number: [id number] Date of Birth/Sex: 1956-02-12 (59 y.o. Female) Treating RN: Primary Care Physician: [name] Other Clinician: Referring Physician: [name] Treating Physician/Extender: Frann Rider in Treatment: 18 Information Obtained from: Patient Chief Complaint Patient presents to the wound care center for a consult due non healing wound 59 year old patient who comes with a history of an ulcer on the left second toe for at least about 4 months and a history on her left heel for about 3 weeks. I needed to get an MRI of her left foot but due to her defibrillator this could not be done and hence she is getting get a triple phase bone scan. 01/17/2015 -- she has developed a significant injury to the bridge of the nose where her CPAP machine was fitting. This is a painfull dark spot. Electronic Signature(s) Signed: 05/18/2015 9:26:43 AM By: Christin Fudge MD, FACS Entered By: Christin Fudge on 05/18/2015 09:26:43 [name] ([id number]) -------------------------------------------------------------------------------- Debridement Details Patient Name: [name] Date of Service: 05/18/2015 8:45 AM Medical Record Number: [id number] Patient Account Number: [id number] Date of Birth/Sex: [birth year] (59 y.o. Female) Treating RN: Baruch Gouty, RN, BSN, [place] Primary Care Physician: [name] Other Clinician: Referring Physician: [name] Treating Physician/Extender: Frann Rider in Treatment: 18 Debridement Performed for Wound #7 Right Toe Second Assessment: Performed By: Physician Pat Patrick., MD Debridement: Open Wound/Selective Debridement Selective Description: Pre-procedure Yes Verification/Time Out Taken: Start Time:  09:17 Pain Control: Lidocaine 4% Topical Solution Level: Non-Viable Tissue Total Area Debrided (L x 0.5 (cm) x 0.5 (cm) = 0.25 (cm) W): Tissue and other Non-Viable, Fibrin/Slough, Skin, Subcutaneous material debrided: Instrument: Forceps, Scissors Bleeding: Minimum Hemostasis Achieved: Pressure End Time: 09:22 Procedural Pain: 0 Post Procedural Pain: 0 Response to Treatment: Procedure was tolerated well Post Debridement Measurements of Total Wound Length: (cm) 0.6 Width: (cm) 1.3 Depth: (cm) 0.3 Volume: (cm) 0.184 Electronic Signature(s) Signed: 05/18/2015 9:22:01 AM By: Regan Lemming BSN, RN Signed: 05/18/2015 1:29:04 PM By: Christin Fudge MD, FACS Previous Signature: 05/18/2015 9:20:44 AM Version By: Regan Lemming BSN, RN Entered By: Regan Lemming on 05/18/2015 09:22:00 [name] ([id number]) -------------------------------------------------------------------------------- HPI Details Patient Name: [name] Date of Service: 05/18/2015 8:45 AM Medical Record Number: [id number] Patient Account Number: [id number] Date of Birth/Sex: [birth year] (59 y.o. Female) Treating RN: Primary Care Physician: [name] Other Clinician: Referring Physician: [name] Treating Physician/Extender: Frann Rider in Treatment: 18 History of Present Illness HPI Description: 59 year old patient who is known to have diabetes for several years and generally has it under good control last hemoglobin A1c was 6.7 done 2 weeks ago. She has several other comorbidities including acute CHF, hypoxia, hyperlipidemia, systolic heart failure, myocardial infarction, coronary artery disease, iron deficiency anemia, renal insufficiency, essential hypertension, morbid obesity and obstructive sleep apnea. 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 In the past she's had a coronary angioplasty with stent in 2013, laparoscopic cholecystectomy  in 2011, bilateral oophorectomy in April 2012 and she's had some lumbar disc surgery in 1996. She says she's had a lot of peripheral neuropathy and due to this she's had a ulcer on her left second toe for a while. She had a dry eschar on her left heel and recently this was removed and she found that she had some infection and some  when to start. She is also starting on oral ciprofloxacin. 03/03/2015 - recently hospitalized for anemia. No source identified. Started on amiodarone, contraindication to HBO. Reports left heel feels better. Main complaint is new buttocks ulcers. No fever or chills. Minimal drainage. 03/10/2015 -- There is a new ulceration noticed yesterday on her right second toe at the tip. She also has noticed some ulcerations on her gluteal region and we will need to take a look at this. As far as her medications go she does not know for sure what her dosage of amiodarone is exactly. She will look it up and let me know today. 03/17/2015 -- as far as her dosage of amiodarone she was taking : 02/25/15 - 03/02/15 400 mg per day; 03/03/15 - 03/10/15 200 mg per day. She stopped taking the medication on 03/10/15. She has not yet done the checks x-ray and her PFTs are still pending. Recent Lab work done on 03/13/2015 shows a glucose is 124, K+ was 3.3, her albumen was 2.9, WBC count was 3.4, hemoglobin was 9.3, hematocrit was 30.4 and platelets were 240. C-reactive protein was 33.7 which was high and the Vancomycin trough was 18.6 which was high. 03/24/2015 -- he saw several doctors since I saw her last and they include her medical oncologist, cardiologist, neurologist and infectious disease doctor. Her PICC line is out and she now is on oral Cipro and doxycycline as per her verbal report. She has completed her pulmonary function test yesterday and a recent chest x-ray is within normal limits. If the PFTs within normal limits she will be able to restart the hyperbaric oxygen therapy. 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 Underlying interstitial lung disease to be considered. Further recommendations were that of a CT chest and pulmonary consult  if indicated. 05/11/2015 -- due to some discomfort in her ears during hyperbaric oxygen therapy, she saw ENT yesterday who basically said everything was good and continue with hyperbaric therapy. She has minimal redness of her right second toe but she is still on ciprofloxacin. [name], [name] ([id number]) 05/18/2015 -- her nose is almost completely healed and so is the sacral area. She still has an open wound on the right second toe which needs further care. He has just completed a course of ciprofloxacin. Electronic Signature(s) Signed: 05/18/2015 9:27:26 AM By: Christin Fudge MD, FACS Entered By: Christin Fudge on 05/18/2015 09:27:26 [name] ([id number]) -------------------------------------------------------------------------------- Physical Exam Details Patient Name: [name] Date of Service: 05/18/2015 8:45 AM Medical Record Number: [id number] Patient Account Number: [id number] Date of Birth/Sex: 1956/06/08 (59 y.o. Female) Treating RN: Primary Care Physician: [name] Other Clinician: Referring Physician: [name] Treating Physician/Extender: Frann Rider in Treatment: 18 Constitutional . Pulse regular. Respirations normal and unlabored. Afebrile. . Eyes Nonicteric. Reactive to light. Ears, Nose, Mouth, and Throat Lips, teeth, and gums WNL.Marland Kitchen Moist mucosa without lesions . Neck supple and nontender. No palpable supraclavicular or cervical adenopathy. Normal sized without goiter. Respiratory WNL. No retractions.. Cardiovascular Pedal Pulses WNL. No clubbing, cyanosis or edema. Chest Breasts symmetical and no nipple discharge.. Breast tissue WNL, no masses, lumps, or tenderness.. Lymphatic No adneopathy. No adenopathy. No adenopathy. Musculoskeletal Adexa without tenderness or enlargement.. Digits and nails w/o clubbing, cyanosis, infection, petechiae, ischemia, or inflammatory conditions.. Integumentary (Hair, Skin) No suspicious  lesions. No crepitus or fluctuance. No peri-wound warmth or erythema. No masses.Marland Kitchen Psychiatric Judgement and insight  [name], [name] ([id number]) Visit Report for 05/18/2015 Chief Complaint Document Details Patient Name: [name], [name] Date of Service: 05/18/2015 8:45 AM Medical Record Number: [id number] Patient Account Number: [id number] Date of Birth/Sex: 1956-02-12 (59 y.o. Female) Treating RN: Primary Care Physician: [name] Other Clinician: Referring Physician: [name] Treating Physician/Extender: Frann Rider in Treatment: 18 Information Obtained from: Patient Chief Complaint Patient presents to the wound care center for a consult due non healing wound 59 year old patient who comes with a history of an ulcer on the left second toe for at least about 4 months and a history on her left heel for about 3 weeks. I needed to get an MRI of her left foot but due to her defibrillator this could not be done and hence she is getting get a triple phase bone scan. 01/17/2015 -- she has developed a significant injury to the bridge of the nose where her CPAP machine was fitting. This is a painfull dark spot. Electronic Signature(s) Signed: 05/18/2015 9:26:43 AM By: Christin Fudge MD, FACS Entered By: Christin Fudge on 05/18/2015 09:26:43 [name] ([id number]) -------------------------------------------------------------------------------- Debridement Details Patient Name: [name] Date of Service: 05/18/2015 8:45 AM Medical Record Number: [id number] Patient Account Number: [id number] Date of Birth/Sex: [birth year] (59 y.o. Female) Treating RN: Baruch Gouty, RN, BSN, [place] Primary Care Physician: [name] Other Clinician: Referring Physician: [name] Treating Physician/Extender: Frann Rider in Treatment: 18 Debridement Performed for Wound #7 Right Toe Second Assessment: Performed By: Physician Pat Patrick., MD Debridement: Open Wound/Selective Debridement Selective Description: Pre-procedure Yes Verification/Time Out Taken: Start Time:  09:17 Pain Control: Lidocaine 4% Topical Solution Level: Non-Viable Tissue Total Area Debrided (L x 0.5 (cm) x 0.5 (cm) = 0.25 (cm) W): Tissue and other Non-Viable, Fibrin/Slough, Skin, Subcutaneous material debrided: Instrument: Forceps, Scissors Bleeding: Minimum Hemostasis Achieved: Pressure End Time: 09:22 Procedural Pain: 0 Post Procedural Pain: 0 Response to Treatment: Procedure was tolerated well Post Debridement Measurements of Total Wound Length: (cm) 0.6 Width: (cm) 1.3 Depth: (cm) 0.3 Volume: (cm) 0.184 Electronic Signature(s) Signed: 05/18/2015 9:22:01 AM By: Regan Lemming BSN, RN Signed: 05/18/2015 1:29:04 PM By: Christin Fudge MD, FACS Previous Signature: 05/18/2015 9:20:44 AM Version By: Regan Lemming BSN, RN Entered By: Regan Lemming on 05/18/2015 09:22:00 [name] ([id number]) -------------------------------------------------------------------------------- HPI Details Patient Name: [name] Date of Service: 05/18/2015 8:45 AM Medical Record Number: [id number] Patient Account Number: [id number] Date of Birth/Sex: [birth year] (59 y.o. Female) Treating RN: Primary Care Physician: [name] Other Clinician: Referring Physician: [name] Treating Physician/Extender: Frann Rider in Treatment: 18 History of Present Illness HPI Description: 59 year old patient who is known to have diabetes for several years and generally has it under good control last hemoglobin A1c was 6.7 done 2 weeks ago. She has several other comorbidities including acute CHF, hypoxia, hyperlipidemia, systolic heart failure, myocardial infarction, coronary artery disease, iron deficiency anemia, renal insufficiency, essential hypertension, morbid obesity and obstructive sleep apnea. 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 In the past she's had a coronary angioplasty with stent in 2013, laparoscopic cholecystectomy  in 2011, bilateral oophorectomy in April 2012 and she's had some lumbar disc surgery in 1996. She says she's had a lot of peripheral neuropathy and due to this she's had a ulcer on her left second toe for a while. She had a dry eschar on her left heel and recently this was removed and she found that she had some infection and some  when to start. She is also starting on oral ciprofloxacin. 03/03/2015 - recently hospitalized for anemia. No source identified. Started on amiodarone, contraindication to HBO. Reports left heel feels better. Main complaint is new buttocks ulcers. No fever or chills. Minimal drainage. 03/10/2015 -- There is a new ulceration noticed yesterday on her right second toe at the tip. She also has noticed some ulcerations on her gluteal region and we will need to take a look at this. As far as her medications go she does not know for sure what her dosage of amiodarone is exactly. She will look it up and let me know today. 03/17/2015 -- as far as her dosage of amiodarone she was taking : 02/25/15 - 03/02/15 400 mg per day; 03/03/15 - 03/10/15 200 mg per day. She stopped taking the medication on 03/10/15. She has not yet done the checks x-ray and her PFTs are still pending. Recent Lab work done on 03/13/2015 shows a glucose is 124, K+ was 3.3, her albumen was 2.9, WBC count was 3.4, hemoglobin was 9.3, hematocrit was 30.4 and platelets were 240. C-reactive protein was 33.7 which was high and the Vancomycin trough was 18.6 which was high. 03/24/2015 -- he saw several doctors since I saw her last and they include her medical oncologist, cardiologist, neurologist and infectious disease doctor. Her PICC line is out and she now is on oral Cipro and doxycycline as per her verbal report. She has completed her pulmonary function test yesterday and a recent chest x-ray is within normal limits. If the PFTs within normal limits she will be able to restart the hyperbaric oxygen therapy. 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 Underlying interstitial lung disease to be considered. Further recommendations were that of a CT chest and pulmonary consult  if indicated. 05/11/2015 -- due to some discomfort in her ears during hyperbaric oxygen therapy, she saw ENT yesterday who basically said everything was good and continue with hyperbaric therapy. She has minimal redness of her right second toe but she is still on ciprofloxacin. [name], [name] ([id number]) 05/18/2015 -- her nose is almost completely healed and so is the sacral area. She still has an open wound on the right second toe which needs further care. He has just completed a course of ciprofloxacin. Electronic Signature(s) Signed: 05/18/2015 9:27:26 AM By: Christin Fudge MD, FACS Entered By: Christin Fudge on 05/18/2015 09:27:26 [name] ([id number]) -------------------------------------------------------------------------------- Physical Exam Details Patient Name: [name] Date of Service: 05/18/2015 8:45 AM Medical Record Number: [id number] Patient Account Number: [id number] Date of Birth/Sex: 1956/06/08 (59 y.o. Female) Treating RN: Primary Care Physician: [name] Other Clinician: Referring Physician: [name] Treating Physician/Extender: Frann Rider in Treatment: 18 Constitutional . Pulse regular. Respirations normal and unlabored. Afebrile. . Eyes Nonicteric. Reactive to light. Ears, Nose, Mouth, and Throat Lips, teeth, and gums WNL.Marland Kitchen Moist mucosa without lesions . Neck supple and nontender. No palpable supraclavicular or cervical adenopathy. Normal sized without goiter. Respiratory WNL. No retractions.. Cardiovascular Pedal Pulses WNL. No clubbing, cyanosis or edema. Chest Breasts symmetical and no nipple discharge.. Breast tissue WNL, no masses, lumps, or tenderness.. Lymphatic No adneopathy. No adenopathy. No adenopathy. Musculoskeletal Adexa without tenderness or enlargement.. Digits and nails w/o clubbing, cyanosis, infection, petechiae, ischemia, or inflammatory conditions.. Integumentary (Hair, Skin) No suspicious  lesions. No crepitus or fluctuance. No peri-wound warmth or erythema. No masses.Marland Kitchen Psychiatric Judgement and insight  [name], [name] ([id number]) Visit Report for 05/18/2015 Chief Complaint Document Details Patient Name: [name], [name] Date of Service: 05/18/2015 8:45 AM Medical Record Number: [id number] Patient Account Number: [id number] Date of Birth/Sex: 1956-02-12 (59 y.o. Female) Treating RN: Primary Care Physician: [name] Other Clinician: Referring Physician: [name] Treating Physician/Extender: Frann Rider in Treatment: 18 Information Obtained from: Patient Chief Complaint Patient presents to the wound care center for a consult due non healing wound 59 year old patient who comes with a history of an ulcer on the left second toe for at least about 4 months and a history on her left heel for about 3 weeks. I needed to get an MRI of her left foot but due to her defibrillator this could not be done and hence she is getting get a triple phase bone scan. 01/17/2015 -- she has developed a significant injury to the bridge of the nose where her CPAP machine was fitting. This is a painfull dark spot. Electronic Signature(s) Signed: 05/18/2015 9:26:43 AM By: Christin Fudge MD, FACS Entered By: Christin Fudge on 05/18/2015 09:26:43 [name] ([id number]) -------------------------------------------------------------------------------- Debridement Details Patient Name: [name] Date of Service: 05/18/2015 8:45 AM Medical Record Number: [id number] Patient Account Number: [id number] Date of Birth/Sex: [birth year] (59 y.o. Female) Treating RN: Baruch Gouty, RN, BSN, [place] Primary Care Physician: [name] Other Clinician: Referring Physician: [name] Treating Physician/Extender: Frann Rider in Treatment: 18 Debridement Performed for Wound #7 Right Toe Second Assessment: Performed By: Physician Pat Patrick., MD Debridement: Open Wound/Selective Debridement Selective Description: Pre-procedure Yes Verification/Time Out Taken: Start Time:  09:17 Pain Control: Lidocaine 4% Topical Solution Level: Non-Viable Tissue Total Area Debrided (L x 0.5 (cm) x 0.5 (cm) = 0.25 (cm) W): Tissue and other Non-Viable, Fibrin/Slough, Skin, Subcutaneous material debrided: Instrument: Forceps, Scissors Bleeding: Minimum Hemostasis Achieved: Pressure End Time: 09:22 Procedural Pain: 0 Post Procedural Pain: 0 Response to Treatment: Procedure was tolerated well Post Debridement Measurements of Total Wound Length: (cm) 0.6 Width: (cm) 1.3 Depth: (cm) 0.3 Volume: (cm) 0.184 Electronic Signature(s) Signed: 05/18/2015 9:22:01 AM By: Regan Lemming BSN, RN Signed: 05/18/2015 1:29:04 PM By: Christin Fudge MD, FACS Previous Signature: 05/18/2015 9:20:44 AM Version By: Regan Lemming BSN, RN Entered By: Regan Lemming on 05/18/2015 09:22:00 [name] ([id number]) -------------------------------------------------------------------------------- HPI Details Patient Name: [name] Date of Service: 05/18/2015 8:45 AM Medical Record Number: [id number] Patient Account Number: [id number] Date of Birth/Sex: [birth year] (59 y.o. Female) Treating RN: Primary Care Physician: [name] Other Clinician: Referring Physician: [name] Treating Physician/Extender: Frann Rider in Treatment: 18 History of Present Illness HPI Description: 59 year old patient who is known to have diabetes for several years and generally has it under good control last hemoglobin A1c was 6.7 done 2 weeks ago. She has several other comorbidities including acute CHF, hypoxia, hyperlipidemia, systolic heart failure, myocardial infarction, coronary artery disease, iron deficiency anemia, renal insufficiency, essential hypertension, morbid obesity and obstructive sleep apnea. 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 In the past she's had a coronary angioplasty with stent in 2013, laparoscopic cholecystectomy  in 2011, bilateral oophorectomy in April 2012 and she's had some lumbar disc surgery in 1996. She says she's had a lot of peripheral neuropathy and due to this she's had a ulcer on her left second toe for a while. She had a dry eschar on her left heel and recently this was removed and she found that she had some infection and some  when to start. She is also starting on oral ciprofloxacin. 03/03/2015 - recently hospitalized for anemia. No source identified. Started on amiodarone, contraindication to HBO. Reports left heel feels better. Main complaint is new buttocks ulcers. No fever or chills. Minimal drainage. 03/10/2015 -- There is a new ulceration noticed yesterday on her right second toe at the tip. She also has noticed some ulcerations on her gluteal region and we will need to take a look at this. As far as her medications go she does not know for sure what her dosage of amiodarone is exactly. She will look it up and let me know today. 03/17/2015 -- as far as her dosage of amiodarone she was taking : 02/25/15 - 03/02/15 400 mg per day; 03/03/15 - 03/10/15 200 mg per day. She stopped taking the medication on 03/10/15. She has not yet done the checks x-ray and her PFTs are still pending. Recent Lab work done on 03/13/2015 shows a glucose is 124, K+ was 3.3, her albumen was 2.9, WBC count was 3.4, hemoglobin was 9.3, hematocrit was 30.4 and platelets were 240. C-reactive protein was 33.7 which was high and the Vancomycin trough was 18.6 which was high. 03/24/2015 -- he saw several doctors since I saw her last and they include her medical oncologist, cardiologist, neurologist and infectious disease doctor. Her PICC line is out and she now is on oral Cipro and doxycycline as per her verbal report. She has completed her pulmonary function test yesterday and a recent chest x-ray is within normal limits. If the PFTs within normal limits she will be able to restart the hyperbaric oxygen therapy. 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 Underlying interstitial lung disease to be considered. Further recommendations were that of a CT chest and pulmonary consult  if indicated. 05/11/2015 -- due to some discomfort in her ears during hyperbaric oxygen therapy, she saw ENT yesterday who basically said everything was good and continue with hyperbaric therapy. She has minimal redness of her right second toe but she is still on ciprofloxacin. [name], [name] ([id number]) 05/18/2015 -- her nose is almost completely healed and so is the sacral area. She still has an open wound on the right second toe which needs further care. He has just completed a course of ciprofloxacin. Electronic Signature(s) Signed: 05/18/2015 9:27:26 AM By: Christin Fudge MD, FACS Entered By: Christin Fudge on 05/18/2015 09:27:26 [name] ([id number]) -------------------------------------------------------------------------------- Physical Exam Details Patient Name: [name] Date of Service: 05/18/2015 8:45 AM Medical Record Number: [id number] Patient Account Number: [id number] Date of Birth/Sex: 1956/06/08 (59 y.o. Female) Treating RN: Primary Care Physician: [name] Other Clinician: Referring Physician: [name] Treating Physician/Extender: Frann Rider in Treatment: 18 Constitutional . Pulse regular. Respirations normal and unlabored. Afebrile. . Eyes Nonicteric. Reactive to light. Ears, Nose, Mouth, and Throat Lips, teeth, and gums WNL.Marland Kitchen Moist mucosa without lesions . Neck supple and nontender. No palpable supraclavicular or cervical adenopathy. Normal sized without goiter. Respiratory WNL. No retractions.. Cardiovascular Pedal Pulses WNL. No clubbing, cyanosis or edema. Chest Breasts symmetical and no nipple discharge.. Breast tissue WNL, no masses, lumps, or tenderness.. Lymphatic No adneopathy. No adenopathy. No adenopathy. Musculoskeletal Adexa without tenderness or enlargement.. Digits and nails w/o clubbing, cyanosis, infection, petechiae, ischemia, or inflammatory conditions.. Integumentary (Hair, Skin) No suspicious  lesions. No crepitus or fluctuance. No peri-wound warmth or erythema. No masses.Marland Kitchen Psychiatric Judgement and insight  [name], [name] ([id number]) Visit Report for 05/18/2015 Chief Complaint Document Details Patient Name: [name], [name] Date of Service: 05/18/2015 8:45 AM Medical Record Number: [id number] Patient Account Number: [id number] Date of Birth/Sex: 1956-02-12 (59 y.o. Female) Treating RN: Primary Care Physician: [name] Other Clinician: Referring Physician: [name] Treating Physician/Extender: Frann Rider in Treatment: 18 Information Obtained from: Patient Chief Complaint Patient presents to the wound care center for a consult due non healing wound 59 year old patient who comes with a history of an ulcer on the left second toe for at least about 4 months and a history on her left heel for about 3 weeks. I needed to get an MRI of her left foot but due to her defibrillator this could not be done and hence she is getting get a triple phase bone scan. 01/17/2015 -- she has developed a significant injury to the bridge of the nose where her CPAP machine was fitting. This is a painfull dark spot. Electronic Signature(s) Signed: 05/18/2015 9:26:43 AM By: Christin Fudge MD, FACS Entered By: Christin Fudge on 05/18/2015 09:26:43 [name] ([id number]) -------------------------------------------------------------------------------- Debridement Details Patient Name: [name] Date of Service: 05/18/2015 8:45 AM Medical Record Number: [id number] Patient Account Number: [id number] Date of Birth/Sex: [birth year] (59 y.o. Female) Treating RN: Baruch Gouty, RN, BSN, [place] Primary Care Physician: [name] Other Clinician: Referring Physician: [name] Treating Physician/Extender: Frann Rider in Treatment: 18 Debridement Performed for Wound #7 Right Toe Second Assessment: Performed By: Physician Pat Patrick., MD Debridement: Open Wound/Selective Debridement Selective Description: Pre-procedure Yes Verification/Time Out Taken: Start Time:  09:17 Pain Control: Lidocaine 4% Topical Solution Level: Non-Viable Tissue Total Area Debrided (L x 0.5 (cm) x 0.5 (cm) = 0.25 (cm) W): Tissue and other Non-Viable, Fibrin/Slough, Skin, Subcutaneous material debrided: Instrument: Forceps, Scissors Bleeding: Minimum Hemostasis Achieved: Pressure End Time: 09:22 Procedural Pain: 0 Post Procedural Pain: 0 Response to Treatment: Procedure was tolerated well Post Debridement Measurements of Total Wound Length: (cm) 0.6 Width: (cm) 1.3 Depth: (cm) 0.3 Volume: (cm) 0.184 Electronic Signature(s) Signed: 05/18/2015 9:22:01 AM By: Regan Lemming BSN, RN Signed: 05/18/2015 1:29:04 PM By: Christin Fudge MD, FACS Previous Signature: 05/18/2015 9:20:44 AM Version By: Regan Lemming BSN, RN Entered By: Regan Lemming on 05/18/2015 09:22:00 [name] ([id number]) -------------------------------------------------------------------------------- HPI Details Patient Name: [name] Date of Service: 05/18/2015 8:45 AM Medical Record Number: [id number] Patient Account Number: [id number] Date of Birth/Sex: [birth year] (59 y.o. Female) Treating RN: Primary Care Physician: [name] Other Clinician: Referring Physician: [name] Treating Physician/Extender: Frann Rider in Treatment: 18 History of Present Illness HPI Description: 59 year old patient who is known to have diabetes for several years and generally has it under good control last hemoglobin A1c was 6.7 done 2 weeks ago. She has several other comorbidities including acute CHF, hypoxia, hyperlipidemia, systolic heart failure, myocardial infarction, coronary artery disease, iron deficiency anemia, renal insufficiency, essential hypertension, morbid obesity and obstructive sleep apnea. 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 In the past she's had a coronary angioplasty with stent in 2013, laparoscopic cholecystectomy  in 2011, bilateral oophorectomy in April 2012 and she's had some lumbar disc surgery in 1996. She says she's had a lot of peripheral neuropathy and due to this she's had a ulcer on her left second toe for a while. She had a dry eschar on her left heel and recently this was removed and she found that she had some infection and some  when to start. She is also starting on oral ciprofloxacin. 03/03/2015 - recently hospitalized for anemia. No source identified. Started on amiodarone, contraindication to HBO. Reports left heel feels better. Main complaint is new buttocks ulcers. No fever or chills. Minimal drainage. 03/10/2015 -- There is a new ulceration noticed yesterday on her right second toe at the tip. She also has noticed some ulcerations on her gluteal region and we will need to take a look at this. As far as her medications go she does not know for sure what her dosage of amiodarone is exactly. She will look it up and let me know today. 03/17/2015 -- as far as her dosage of amiodarone she was taking : 02/25/15 - 03/02/15 400 mg per day; 03/03/15 - 03/10/15 200 mg per day. She stopped taking the medication on 03/10/15. She has not yet done the checks x-ray and her PFTs are still pending. Recent Lab work done on 03/13/2015 shows a glucose is 124, K+ was 3.3, her albumen was 2.9, WBC count was 3.4, hemoglobin was 9.3, hematocrit was 30.4 and platelets were 240. C-reactive protein was 33.7 which was high and the Vancomycin trough was 18.6 which was high. 03/24/2015 -- he saw several doctors since I saw her last and they include her medical oncologist, cardiologist, neurologist and infectious disease doctor. Her PICC line is out and she now is on oral Cipro and doxycycline as per her verbal report. She has completed her pulmonary function test yesterday and a recent chest x-ray is within normal limits. If the PFTs within normal limits she will be able to restart the hyperbaric oxygen therapy. 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 Underlying interstitial lung disease to be considered. Further recommendations were that of a CT chest and pulmonary consult  if indicated. 05/11/2015 -- due to some discomfort in her ears during hyperbaric oxygen therapy, she saw ENT yesterday who basically said everything was good and continue with hyperbaric therapy. She has minimal redness of her right second toe but she is still on ciprofloxacin. [name], [name] ([id number]) 05/18/2015 -- her nose is almost completely healed and so is the sacral area. She still has an open wound on the right second toe which needs further care. He has just completed a course of ciprofloxacin. Electronic Signature(s) Signed: 05/18/2015 9:27:26 AM By: Christin Fudge MD, FACS Entered By: Christin Fudge on 05/18/2015 09:27:26 [name] ([id number]) -------------------------------------------------------------------------------- Physical Exam Details Patient Name: [name] Date of Service: 05/18/2015 8:45 AM Medical Record Number: [id number] Patient Account Number: [id number] Date of Birth/Sex: 1956/06/08 (59 y.o. Female) Treating RN: Primary Care Physician: [name] Other Clinician: Referring Physician: [name] Treating Physician/Extender: Frann Rider in Treatment: 18 Constitutional . Pulse regular. Respirations normal and unlabored. Afebrile. . Eyes Nonicteric. Reactive to light. Ears, Nose, Mouth, and Throat Lips, teeth, and gums WNL.Marland Kitchen Moist mucosa without lesions . Neck supple and nontender. No palpable supraclavicular or cervical adenopathy. Normal sized without goiter. Respiratory WNL. No retractions.. Cardiovascular Pedal Pulses WNL. No clubbing, cyanosis or edema. Chest Breasts symmetical and no nipple discharge.. Breast tissue WNL, no masses, lumps, or tenderness.. Lymphatic No adneopathy. No adenopathy. No adenopathy. Musculoskeletal Adexa without tenderness or enlargement.. Digits and nails w/o clubbing, cyanosis, infection, petechiae, ischemia, or inflammatory conditions.. Integumentary (Hair, Skin) No suspicious  lesions. No crepitus or fluctuance. No peri-wound warmth or erythema. No masses.Marland Kitchen Psychiatric Judgement and insight

## 2015-05-19 NOTE — Progress Notes (Signed)
[name], [name] ([id number]) Visit Report for 05/18/2015 HBO Details Patient Name: [name], [name] Date of Service: 05/18/2015 10:00 AM Medical Record Number: [id number] Patient Account Number: [id number] Date of Birth/Sex: 11-26-1955 (59 y.o. 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 HBO Treatment Details Treatment Number: 22 Patient Type: Outpatient Chamber Type: Monoplace Chamber #: HBO [id number] Treatment Protocol: 2.0 ATA with 90 minutes oxygen, and no air breaks Treatment Details Compression Rate Down: 1.5 psi / minute De-Compression Rate Up: 1.5 psi / minute Air breaks and breathing Compress Tx Pressure Decompress Decompress periods Begins Reached Begins Ends (leave unused spaces blank) Chamber Pressure 1 ATA 2.0 ATA - - - - - - 2.0 ATA 1 ATA Clock Time (24 hr) 09:41 09:51 - - - - - - 11:22 11:32 Treatment Length: 111 (minutes) Treatment Segments: 4 Capillary Blood Glucose Pre Capillary Blood Glucose (mg/dl): Post Capillary Blood Glucose (mg/dl): Vital Signs Capillary Blood Glucose Reference Range: 80 - 120 mg / dl HBO Diabetic Blood Glucose Intervention Range: <131 mg/dl or >249 mg/dl Time Vitals Blood Respiratory Capillary Blood Glucose Pulse Action Type: Pulse: Temperature: Taken: Pressure: Rate: Glucose (mg/dl): Meter #: Oximetry (%) Taken: Pre 09:07 127/62 79 18 97.7 230 1 none Post 11:36 136/74 78 18 97.7 203 1 none Treatment Response Well [name], [name] ([id number]) Treatment Toleration: Treatment Treatment Completed without Adverse Event Completion  Status: Electronic Signature(s) Signed: 05/18/2015 1:29:04 PM By: Christin Fudge MD, FACS Signed: 05/18/2015 1:51:17 PM By: [name] RCP, RRT, CHT Previous Signature: 05/18/2015 11:43:01 AM Version By: Christin Fudge MD, FACS Entered By: [name] on 05/18/2015 11:51:42 [name] ([id number]) -------------------------------------------------------------------------------- HBO Safety Checklist Details Patient Name: [name] Date of Service: 05/18/2015 10:00 AM Medical Record Number: [id number] Patient Account Number: [id number] Date of Birth/Sex: [birth year] (59 y.o. Female) Treating RN: Primary Care Physician: [name] Other Clinician: Jacqulyn [name] Referring Physician: [name] Treating Physician/Extender: Frann Rider in Treatment: 18 HBO Safety Checklist Items Safety Checklist Consent Form Signed Patient voided / foley secured and emptied When did you last eato 19:00 pm 05/17/15 Last dose of injectable or oral agent 08:15 am Ostomy pouch emptied and vented if applicable n/a All implantable devices assessed, documented and approved ICD Intravenous access site secured and place n/a Valuables secured Linens and cotton and cotton/polyester blend (less than 51% polyester) Personal oil-based products / skin lotions / body lotions removed Wigs or hairpieces removed Smoking or tobacco materials removed Books / newspapers / magazines / loose paper removed Cologne, aftershave, perfume and deodorant removed Jewelry removed (may wrap wedding band) Make-up removed Hair care products removed Battery operated devices (external) removed Heating patches and chemical warmers removed Titanium eyewear removed n/a Nail polish cured greater than 10 hours n/a Casting material cured greater than 10 hours n/a Hearing aids removed n/a Loose dentures or partials removed n/a Prosthetics have been removed n/a Patient demonstrates  correct use of air break device (if applicable) Patient concerns have been addressed Patient grounding bracelet on and cord attached to chamber Specifics for Inpatients (complete in addition to above) Medication sheet sent with patient Intravenous medications needed or due during therapy sent with patient [name], [name] ([id number]) Drainage tubes (e.g. nasogastric tube or  chest tube secured and vented) Endotracheal or Tracheotomy tube secured Cuff deflated of air and inflated with saline Airway suctioned Electronic Signature(s) Signed: 05/18/2015 1:51:17 PM By: [name] RCP, RRT, CHT Entered By: [name] on 05/18/2015 09:36:01

## 2015-05-20 NOTE — Progress Notes (Signed)
MARSHELL, COBLER ([id number]) Visit Report for 05/19/2015 Arrival Information Details Patient Name: [name], [name] Date of Service: 05/19/2015 10:00 AM Medical Record Number: [id number] Patient Account Number: [id number] Date of Birth/Sex: [birth year] (59 y.o. Female) Treating RN: Primary Care Physician: [name] Other Clinician: Jacqulyn Bath Referring Physician: [name] Treating Physician/Extender: Frann Rider in Treatment: 2 Visit Information History Since Last Visit Added or deleted any medications: No Patient Arrived: Wheel Chair Any new allergies or adverse reactions: No Arrival Time: 08:35 Had a fall or experienced change in No Accompanied By: husband activities of daily living that may affect Transfer Assistance: Manual risk of falls: Patient Identification Verified: Yes Signs or symptoms of abuse/neglect No Secondary Verification Process Yes since last visito Completed: Hospitalized since last visit: No Patient Requires Transmission- No Has Dressing in Place as Prescribed: Yes Based Precautions: Has Footwear/Offloading in Place as Yes Patient Has Alerts: Yes Prescribed: Patient Alerts: Patient on Blood Left: Surgical Shoe with Thinner Pressure Relief ABI L:0.8, Insole R:0.93 Right: Surgical Shoe with Pressure Relief Insole Pain Present Now: No Electronic Signature(s) Signed: 05/19/2015 1:21:51 PM By: Lorine Bears RCP, RRT, CHT Entered By: Lorine Bears on 05/19/2015 08:44:44 [name] ([id number]) -------------------------------------------------------------------------------- Encounter Discharge Information Details Patient Name: [name] Date of Service: 05/19/2015 10:00 AM Medical Record Number: [id number] Patient Account Number: [id number] Date of Birth/Sex: [birth year] (59 y.o. Female) Treating RN: Primary Care Physician: [name] Other Clinician: Jacqulyn Bath Referring  Physician: [name] Treating Physician/Extender: Frann Rider in Treatment: 38 Encounter Discharge Information Items Discharge Pain Level: 0 Discharge Condition: Stable Ambulatory Status: Wheelchair Discharge Destination: Home Private Transportation: Auto Accompanied By: husband Schedule Follow-up Appointment: No Medication Reconciliation completed and No provided to Patient/Care Waniya Hoglund: Clinical Summary of Care: Notes Patient has an HBO treatment scheduled on 05/22/15 at 10:00 am. Electronic Signature(s) Signed: 05/19/2015 1:21:51 PM By: Lorine Bears RCP, RRT, CHT Entered By: Lorine Bears on 05/19/2015 11:20:15 [name] ([id number]) -------------------------------------------------------------------------------- Vitals Details Patient Name: [name] Date of Service: 05/19/2015 10:00 AM Medical Record Number: [id number] Patient Account Number: [id number] Date of Birth/Sex: 07-01-1956 (59 y.o. Female) Treating RN: Primary Care Physician: [name] Other Clinician: Jacqulyn Bath Referring Physician: [name] Treating Physician/Extender: Frann Rider in Treatment: 18 Vital Signs Time Taken: 08:39 Temperature (F): 97.9 Height (in): 68 Pulse (bpm): 84 Weight (lbs): 257 Respiratory Rate (breaths/min): 18 Body Mass Index (BMI): 39.1 Blood Pressure (mmHg): 120/60 Capillary Blood Glucose (mg/dl): 252 Reference Range: 80 - 120 mg / dl Electronic Signature(s) Signed: 05/19/2015 1:21:51 PM By: Lorine Bears RCP, RRT, CHT Entered By: Lorine Bears on 05/19/2015 08:45:06

## 2015-05-20 NOTE — Progress Notes (Signed)
[name], [name] ([id number]) Visit Report for 05/19/2015 HBO Details Patient Name: [name], [name] Date of Service: 05/19/2015 10:00 AM Medical Record Number: [id number] Patient Account Number: [id number] Date of Birth/Sex: 06-01-1956 (59 y.o. 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 HBO Treatment Details Treatment Number: 23 Patient Type: Outpatient Chamber Type: Monoplace Chamber #: HBO [id number] Treatment Protocol: 2.0 ATA with 90 minutes oxygen, and no air breaks Treatment Details Compression Rate Down: 1.5 psi / minute De-Compression Rate Up: 1.5 psi / minute Air breaks and breathing Compress Tx Pressure Decompress Decompress periods Begins Reached Begins Ends (leave unused spaces blank) Chamber Pressure 1 ATA 2.0 ATA - - - - - - 2.0 ATA 1 ATA Clock Time (24 hr) 08:53 09:03 - - - - - - 10:33 10:43 Treatment Length: 110 (minutes) Treatment Segments: 4 Capillary Blood Glucose Pre Capillary Blood Glucose (mg/dl): Post Capillary Blood Glucose (mg/dl): Vital Signs Capillary Blood Glucose Reference Range: 80 - 120 mg / dl HBO Diabetic Blood Glucose Intervention Range: <131 mg/dl or >249 mg/dl Time Vitals Blood Respiratory Capillary Blood Glucose Pulse Action Type: Pulse: Temperature: Taken: Pressure: Rate: Glucose (mg/dl): Meter #: Oximetry (%) Taken: Pre 08:39 120/60 84 18 97.9 252 none Post 10:48 142/76 72 18 97.8 231 1 none Treatment Response Well [name], [name] ([id number]) Treatment Toleration: Treatment Treatment Completed without Adverse Event Completion Status: Electronic  Signature(s) Signed: 05/19/2015 12:28:48 PM By: Christin Fudge MD, FACS Signed: 05/19/2015 1:21:51 PM By: Lorine Bears RCP, RRT, CHT Previous Signature: 05/19/2015 11:08:06 AM Version By: Christin Fudge MD, FACS Entered By: Lorine Bears on 05/19/2015 11:19:34 [name] ([id number]) -------------------------------------------------------------------------------- HBO Safety Checklist Details Patient Name: [name] Date of Service: 05/19/2015 10:00 AM Medical Record Number: [id number] Patient Account Number: [id number] Date of Birth/Sex: [birth year] (59 y.o. Female) Treating RN: Primary Care Physician: [name] Other Clinician: Jacqulyn Bath Referring Physician: [name] Treating Physician/Extender: Frann Rider in Treatment: 18 HBO Safety Checklist Items Safety Checklist Consent Form Signed Patient voided / foley secured and emptied When did you last eato 20:30 pm on 05/17/14 Last dose of injectable or oral agent 8:00 am Ostomy pouch emptied and vented if applicable n/a All implantable devices assessed, documented and approved ICD Intravenous access site secured and place n/a Valuables secured Linens and cotton and cotton/polyester blend (less than 51% polyester) Personal oil-based products / skin lotions / body lotions removed Wigs or hairpieces removed Smoking or tobacco materials removed Books / newspapers / magazines / loose paper removed Cologne, aftershave, perfume and deodorant removed Jewelry removed (may wrap wedding band) Make-up removed Hair care products removed Battery operated devices (external) removed Heating patches and chemical warmers removed Titanium eyewear removed n/a Nail polish cured greater than 10 hours n/a Casting material cured greater than 10 hours n/a Hearing aids removed n/a Loose dentures or partials removed n/a Prosthetics have been removed n/a Patient demonstrates correct use of  air break device (if applicable) Patient concerns have been addressed Patient grounding bracelet on and cord attached to chamber Specifics for Inpatients (complete in addition to above) Medication sheet sent with patient Intravenous medications needed or due during therapy sent with patient [name], [name] ([id number]) Drainage tubes (e.g. nasogastric tube or  chest tube secured and vented) Endotracheal or Tracheotomy tube secured Cuff deflated of air and inflated with saline Airway suctioned Electronic Signature(s) Signed: 05/19/2015 1:21:51 PM By: Lorine Bears RCP, RRT, CHT Entered By: Becky Sax, Amado Nash on 05/19/2015 08:46:12

## 2015-05-22 ENCOUNTER — Encounter: Payer: PPO | Admitting: Surgery

## 2015-05-22 DIAGNOSIS — L97512 Non-pressure chronic ulcer of other part of right foot with fat layer exposed: Secondary | ICD-10-CM | POA: Diagnosis not present

## 2015-05-22 LAB — GLUCOSE, CAPILLARY
GLUCOSE-CAPILLARY: 183 mg/dL — AB (ref 65–99)
GLUCOSE-CAPILLARY: 164 mg/dL — AB (ref 65–99)

## 2015-05-22 NOTE — Progress Notes (Signed)
[name], [name] ([id number]) Visit Report for 05/22/2015 HBO Details Patient Name: [name], [name] Date of Service: 05/22/2015 10:00 AM Medical Record Number: [id number] Patient Account Number: [id number] Date of Birth/Sex: 1955-12-25 (59 y.o. 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 HBO Treatment Details Treatment Number: 24 Patient Type: Outpatient Chamber Type: Monoplace Chamber #: HBO [id number] Treatment Protocol: 2.0 ATA with 90 minutes oxygen, and no air breaks Treatment Details Compression Rate Down: 1.5 psi / minute De-Compression Rate Up: 1.5 psi / minute Air breaks and breathing Compress Tx Pressure Decompress Decompress periods Begins Reached Begins Ends (leave unused spaces blank) Chamber Pressure 1 ATA 2.0 ATA - - - - - - 2.0 ATA 1 ATA Clock Time (24 hr) 09:42 09:52 - - - - - - 11:22 11:32 Treatment Length: 110 (minutes) Treatment Segments: 4 Capillary Blood Glucose Pre Capillary Blood Glucose (mg/dl): Post Capillary Blood Glucose (mg/dl): Vital Signs Capillary Blood Glucose Reference Range: 80 - 120 mg / dl HBO Diabetic Blood Glucose Intervention Range: <131 mg/dl or >249 mg/dl Time Vitals Blood Respiratory Capillary Blood Glucose Pulse Action Type: Pulse: Temperature: Taken: Pressure: Rate: Glucose (mg/dl): Meter #: Oximetry (%) Taken: Pre 09:28 98/62 84 18 97.7 183 1 none Post 11:37 130/74 72 18 97.6 130 1 none Treatment Response Well [name], [name] ([id number]) Treatment Toleration: Treatment Treatment Completed without Adverse Event Completion Status: HBO  Attestation I certify that I supervised this HBO treatment in accordance with Medicare guidelines. A trained Yes emergency response team is readily available per hospital policies and procedures. Continue HBOT as ordered. Yes Electronic Signature(s) Signed: 05/22/2015 12:19:01 PM By: Christin Fudge MD, FACS Previous Signature: 05/22/2015 12:18:53 PM Version By: Christin Fudge MD, FACS Entered By: Christin Fudge on 05/22/2015 12:19:01 [name] ([id number]) -------------------------------------------------------------------------------- HBO Safety Checklist Details Patient Name: [name] Date of Service: 05/22/2015 10:00 AM Medical Record Number: [id number] Patient Account Number: [id number] Date of Birth/Sex: 1955-12-30 (59 y.o. Female) Treating RN: Primary Care Physician: [name] Other Clinician: Jacqulyn Bath Referring Physician: [name] Treating Physician/Extender: Frann Rider in Treatment: 18 HBO Safety Checklist Items Safety Checklist Consent Form Signed Patient voided / foley secured and emptied When did you last eato 21:30 pm on 05/21/15 Last dose of injectable or oral agent 08:00 am Ostomy pouch emptied and vented if applicable n/a All implantable devices assessed, documented and approved ICD Intravenous access site secured and place n/a Valuables secured Linens and cotton and cotton/polyester blend (less than 51% polyester) Personal oil-based products / skin lotions / body lotions removed Wigs or hairpieces removed Smoking or tobacco materials removed Books / newspapers / magazines / loose paper removed Cologne, aftershave, perfume and deodorant removed Jewelry removed (may wrap wedding band) Make-up removed Hair care products removed Battery operated devices (external) removed Heating patches and chemical warmers removed Titanium eyewear removed n/a Nail polish cured greater than 10 hours n/a Casting material cured greater than  10 hours n/a Hearing aids removed n/a Loose dentures or partials removed n/a Prosthetics have been removed n/a Patient demonstrates correct use of air break device (if applicable) Patient concerns have been addressed Patient grounding bracelet on and cord attached to chamber Specifics for Inpatients (complete in addition to above) Medication sheet  sent with patient Intravenous medications needed or due during therapy sent with patient [name], [name] ([id number]) Drainage tubes (e.g. nasogastric tube or chest tube secured and vented) Endotracheal or Tracheotomy tube secured Cuff deflated of air and inflated with saline Airway suctioned Electronic Signature(s) Signed: 05/22/2015 4:04:11 PM By: [name], [name], [name] Entered By: Becky Sax, Amado Nash on 05/22/2015 09:49:02

## 2015-05-22 NOTE — Progress Notes (Signed)
LILYBELLE, STASIAK ([id number]) Visit Report for 05/22/2015 Arrival Information Details Patient Name: [name], [name] Date of Service: 05/22/2015 10:00 AM Medical Record Number: [id number] Patient Account Number: [id number] Date of Birth/Sex: 18-May-1956 (59 y.o. Female) Treating RN: Primary Care Physician: [name] Other Clinician: Jacqulyn Bath Referring Physician: [name] Treating Physician/Extender: Frann Rider in Treatment: 17 Visit Information History Since Last Visit Added or deleted any medications: No Patient Arrived: Wheel Chair Any new allergies or adverse reactions: No Arrival Time: 09:25 Signs or symptoms of abuse/neglect No Accompanied By: husband since last visito Transfer Assistance: Manual Hospitalized since last visit: No Patient Identification Verified: Yes Has Dressing in Place as Prescribed: Yes Secondary Verification Process Yes Has Footwear/Offloading in Place as Yes Completed: Prescribed: Patient Requires Transmission- No Left: Surgical Shoe with Based Precautions: Pressure Relief Patient Has Alerts: Yes Insole Patient Alerts: Patient on Blood Right: Surgical Shoe with Thinner Pressure Relief ABI L:0.8, Insole R:0.93 Pain Present Now: No Electronic Signature(s) Signed: 05/22/2015 4:04:11 PM By: Lorine Bears RCP, RRT, CHT Entered By: Lorine Bears on 05/22/2015 09:47:09 [name] ([id number]) -------------------------------------------------------------------------------- Encounter Discharge Information Details Patient Name: [name] Date of Service: 05/22/2015 10:00 AM Medical Record Number: [id number] Patient Account Number: [id number] Date of Birth/Sex: [birth year] (59 y.o. Female) Treating RN: Primary Care Physician: [name] Other Clinician: Jacqulyn Bath Referring Physician: [name] Treating Physician/Extender: Frann Rider in Treatment:  33 Encounter Discharge Information Items Discharge Pain Level: 0 Discharge Condition: Stable Ambulatory Status: Wheelchair Discharge Destination: Home Private Transportation: Auto Accompanied By: husband Schedule Follow-up Appointment: No Medication Reconciliation completed and No provided to Patient/Care Espn Zeman: Clinical Summary of Care: Notes Patient has an HBO treatment scheduled on 05/23/15 at 10:00 am. Electronic Signature(s) Signed: 05/22/2015 4:04:11 PM By: Lorine Bears RCP, RRT, CHT Entered By: Lorine Bears on 05/22/2015 11:53:55 [name] ([id number]) -------------------------------------------------------------------------------- Vitals Details Patient Name: [name] Date of Service: 05/22/2015 10:00 AM Medical Record Number: [id number] Patient Account Number: [id number] Date of Birth/Sex: 12-27-1955 (59 y.o. Female) Treating RN: Primary Care Physician: [name] Other Clinician: Jacqulyn Bath Referring Physician: [name] Treating Physician/Extender: Frann Rider in Treatment: 18 Vital Signs Time Taken: 09:28 Temperature (F): 97.7 Height (in): 68 Pulse (bpm): 84 Weight (lbs): 257 Respiratory Rate (breaths/min): 18 Body Mass Index (BMI): 39.1 Blood Pressure (mmHg): 98/62 Capillary Blood Glucose (mg/dl): 183 Reference Range: 80 - 120 mg / dl Electronic Signature(s) Signed: 05/22/2015 4:04:11 PM By: Lorine Bears RCP, RRT, CHT Entered By: Lorine Bears on 05/22/2015 09:47:35

## 2015-05-23 ENCOUNTER — Encounter: Payer: PPO | Admitting: Surgery

## 2015-05-23 ENCOUNTER — Other Ambulatory Visit: Payer: Self-pay | Admitting: Family Medicine

## 2015-05-23 DIAGNOSIS — L97512 Non-pressure chronic ulcer of other part of right foot with fat layer exposed: Secondary | ICD-10-CM | POA: Diagnosis not present

## 2015-05-23 LAB — GLUCOSE, CAPILLARY
GLUCOSE-CAPILLARY: 295 mg/dL — AB (ref 65–99)
Glucose-Capillary: 218 mg/dL — ABNORMAL HIGH (ref 65–99)

## 2015-05-23 NOTE — Progress Notes (Signed)
Vickie, [name] ([id number]) Visit Report for 05/23/2015 Arrival Information Details Patient Name: [name], [name] Date of Service: 05/23/2015 10:00 AM Medical Record Number: [id number] Patient Account Number: [id number] Date of Birth/Sex: 10/11/1955 (59 y.o. Female) Treating RN: Primary Care Physician: [name] Other Clinician: Jacqulyn Bath Referring Physician: [name] Treating Physician/Extender: Frann Rider in Treatment: 1 Visit Information History Since Last Visit Added or deleted any medications: No Patient Arrived: Wheel Chair Any new allergies or adverse reactions: No Arrival Time: 08:55 Had a fall or experienced change in No Accompanied By: husband activities of daily living that may affect Transfer Assistance: Manual risk of falls: Patient Identification Verified: Yes Signs or symptoms of abuse/neglect No Secondary Verification Process Yes since last visito Completed: Hospitalized since last visit: No Patient Requires Transmission- No Has Dressing in Place as Prescribed: Yes Based Precautions: Has Footwear/Offloading in Place as Yes Patient Has Alerts: Yes Prescribed: Patient Alerts: Patient on Blood Left: Surgical Shoe with Thinner Pressure Relief ABI L:0.8, Insole R:0.93 Right: Surgical Shoe with Pressure Relief Insole Pain Present Now: No Electronic Signature(s) Signed: 05/23/2015 11:58:08 AM By: Lorine Bears RCP, RRT, CHT Entered By: Lorine Bears on 05/23/2015 09:03:07 [name] ([id number]) -------------------------------------------------------------------------------- Encounter Discharge Information Details Patient Name: [name] Date of Service: 05/23/2015 10:00 AM Medical Record Number: [id number] Patient Account Number: [id number] Date of Birth/Sex: November 15, 1955 (59 y.o. Female) Treating RN: Primary Care Physician: [name] Other Clinician: Jacqulyn Bath Referring  Physician: [name] Treating Physician/Extender: Frann Rider in Treatment: 48 Encounter Discharge Information Items Discharge Pain Level: 0 Discharge Condition: Stable Ambulatory Status: Wheelchair Discharge Destination: Home Private Transportation: Auto Accompanied By: husband Schedule Follow-up Appointment: No Medication Reconciliation completed and No provided to Patient/Care Latoya Maulding: Clinical Summary of Care: Notes Patient has an HBO treatment scheduled on 05/24/15 at 10:00 am. Electronic Signature(s) Signed: 05/23/2015 11:58:08 AM By: Lorine Bears RCP, RRT, CHT Entered By: Lorine Bears on 05/23/2015 11:14:30 [name] ([id number]) -------------------------------------------------------------------------------- Vitals Details Patient Name: [name] Date of Service: 05/23/2015 10:00 AM Medical Record Number: [id number] Patient Account Number: [id number] Date of Birth/Sex: 01/18/1956 (59 y.o. Female) Treating RN: Primary Care Physician: [name] Other Clinician: Jacqulyn Bath Referring Physician: [name] Treating Physician/Extender: Frann Rider in Treatment: 15 Vital Signs Time Taken: 08:57 Temperature (F): 97.7 Height (in): 68 Pulse (bpm): 96 Weight (lbs): 257 Respiratory Rate (breaths/min): 18 Body Mass Index (BMI): 39.1 Blood Pressure (mmHg): 100/60 Capillary Blood Glucose (mg/dl): 218 Reference Range: 80 - 120 mg / dl Electronic Signature(s) Signed: 05/23/2015 11:58:08 AM By: Lorine Bears RCP, RRT, CHT Entered By: Becky Sax, Amado Nash on 05/23/2015 09:03:28

## 2015-05-23 NOTE — Progress Notes (Signed)
KARRIS, BALIAN ([id number]) Visit Report for 05/23/2015 HBO Details Patient Name: [name], [name] Date of Service: 05/23/2015 10:00 AM Medical Record Number: [id number] Patient Account Number: [id number] Date of Birth/Sex: [birth year] (59 y.o. 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 HBO Treatment Details Treatment Number: 25 Patient Type: Outpatient Chamber Type: Monoplace Chamber #: HBO [id number] Treatment Protocol: 2.0 ATA with 90 minutes oxygen, and no air breaks Treatment Details Compression Rate Down: 1.5 psi / minute De-Compression Rate Up: 1.5 psi / minute Air breaks and breathing Compress Tx Pressure Decompress Decompress periods Begins Reached Begins Ends (leave unused spaces blank) Chamber Pressure 1 ATA 2.0 ATA - - - - - - 2.0 ATA 1 ATA Clock Time (24 hr) 09:12 09:22 - - - - - - 10:52 11:02 Treatment Length: 110 (minutes) Treatment Segments: 4 Capillary Blood Glucose Pre Capillary Blood Glucose (mg/dl): Post Capillary Blood Glucose (mg/dl): Vital Signs Capillary Blood Glucose Reference Range: 80 - 120 mg / dl HBO Diabetic Blood Glucose Intervention Range: <131 mg/dl or >249 mg/dl Time Vitals Blood Respiratory Capillary Blood Glucose Pulse Action Type: Pulse: Temperature: Taken: Pressure: Rate: Glucose (mg/dl): Meter #: Oximetry (%) Taken: Pre 08:57 100/60 96 18 97.7 218 1 none Post 11:06 144/70 72 18 97.8 295 1 none Treatment Response Well [name], [name] ([id number]) Treatment Toleration: Treatment Treatment Completed without Adverse Event Completion  Status: Electronic Signature(s) Signed: 05/23/2015 11:58:08 AM By: Lorine Bears RCP, RRT, CHT Signed: 05/23/2015 12:24:58 PM By: Christin Fudge MD, FACS Previous Signature: 05/23/2015 11:08:05 AM Version By: Christin Fudge MD, FACS Entered By: Lorine Bears on 05/23/2015 11:13:44 [name] ([id number]) -------------------------------------------------------------------------------- HBO Safety Checklist Details Patient Name: [name] Date of Service: 05/23/2015 10:00 AM Medical Record Number: [id number] Patient Account Number: [id number] Date of Birth/Sex: 04/24/1956 (59 y.o. Female) Treating RN: Primary Care Physician: [name] Other Clinician: Jacqulyn Bath Referring Physician: [name] Treating Physician/Extender: Frann Rider in Treatment: 19 HBO Safety Checklist Items Safety Checklist Consent Form Signed Patient voided / foley secured and emptied When did you last eato 08:00 am Last dose of injectable or oral agent 22:00 pm Ostomy pouch emptied and vented if applicable n/a All implantable devices assessed, documented and approved ICD Intravenous access site secured and place n/a Valuables secured Linens and cotton and cotton/polyester blend (less than 51% polyester) Personal oil-based products / skin lotions / body lotions removed Wigs or hairpieces removed Smoking or tobacco materials removed Books / newspapers / magazines / loose paper removed Cologne, aftershave, perfume and deodorant removed Jewelry removed (may wrap wedding band) Make-up removed Hair care products removed Battery operated devices (external) removed Heating patches and chemical warmers removed Titanium eyewear removed n/a Nail polish cured greater than 10 hours n/a Casting material cured greater than 10 hours n/a Hearing aids removed n/a Loose dentures or partials removed n/a Prosthetics have been removed n/a Patient demonstrates  correct use of air break device (if applicable) Patient concerns have been addressed Patient grounding bracelet on and cord attached to chamber Specifics for Inpatients (complete in addition to above) Medication sheet sent with patient Intravenous medications needed or due during therapy sent with patient [name], [name] ([id number]) Drainage tubes (e.g. nasogastric tube or chest  tube secured and vented) Endotracheal or Tracheotomy tube secured Cuff deflated of air and inflated with saline Airway suctioned Electronic Signature(s) Signed: 05/23/2015 11:58:08 AM By: Lorine Bears RCP, RRT, CHT Entered By: Lorine Bears on 05/23/2015 09:05:32

## 2015-05-24 ENCOUNTER — Encounter: Payer: PPO | Admitting: Surgery

## 2015-05-24 ENCOUNTER — Telehealth: Payer: Self-pay | Admitting: *Deleted

## 2015-05-24 DIAGNOSIS — L97512 Non-pressure chronic ulcer of other part of right foot with fat layer exposed: Secondary | ICD-10-CM | POA: Diagnosis not present

## 2015-05-24 LAB — GLUCOSE, CAPILLARY
Glucose-Capillary: 170 mg/dL — ABNORMAL HIGH (ref 65–99)
Glucose-Capillary: 167 mg/dL — ABNORMAL HIGH (ref 65–99)

## 2015-05-24 NOTE — Telephone Encounter (Signed)
Pt notified that her Kidney function is much improved with Creatinine at 1.17. Pt states that her BP this morning when checked at home was: 98/62. About two hours later when checked somewhere else it was: 144/80.

## 2015-05-24 NOTE — Progress Notes (Signed)
[name], [name] ([id number]) Visit Report for 05/24/2015 Arrival Information Details Patient Name: [name], [name] Date of Service: 05/24/2015 10:00 AM Medical Record Number: [id number] Patient Account Number: [id number] Date of Birth/Sex: 01/20/1956 (59 y.o. Female) Treating RN: Primary Care Physician: [name] Other Clinician: Jacqulyn Bath Referring Physician: [name] Treating Physician/Extender: BURNS III, Charlean Sanfilippo in Treatment: 26 Visit Information History Since Last Visit Added or deleted any medications: No Patient Arrived: Wheel Chair Any new allergies or adverse reactions: No Arrival Time: 08:35 Had a fall or experienced change in No Accompanied By: husband activities of daily living that may affect Transfer Assistance: Manual risk of falls: Patient Identification Verified: Yes Signs or symptoms of abuse/neglect No Secondary Verification Process Yes since last visito Completed: Hospitalized since last visit: No Patient Requires Transmission- No Has Dressing in Place as Prescribed: Yes Based Precautions: Has Footwear/Offloading in Place as Yes Patient Has Alerts: Yes Prescribed: Patient Alerts: Patient on Blood Left: Surgical Shoe with Thinner Pressure Relief ABI L:0.8, Insole R:0.93 Right: Surgical Shoe with Pressure Relief Insole Pain Present Now: No Electronic Signature(s) Signed: 05/24/2015 11:28:44 AM By: Lorine Bears RCP, RRT, CHT Entered By: Lorine Bears on 05/24/2015 08:46:08 [name] ([id number]) -------------------------------------------------------------------------------- Encounter Discharge Information Details Patient Name: [name] Date of Service: 05/24/2015 10:00 AM Medical Record Number: [id number] Patient Account Number: [id number] Date of Birth/Sex: 09-Jul-1956 (59 y.o. Female) Treating RN: Primary Care Physician: [name] Other Clinician: Jacqulyn Bath Referring Physician: [name] Treating Physician/Extender: BURNS III, Charlean Sanfilippo in Treatment: 93 Encounter Discharge Information Items Discharge Pain Level: 0 Discharge Condition: Stable Ambulatory Status: Wheelchair Discharge Destination: Home Private Transportation: Auto Accompanied By: husband Schedule Follow-up Appointment: No Medication Reconciliation completed and No provided to Patient/Care Kennidy Lamke: Clinical Summary of Care: Notes Patient has an HBO treatment scheduled on 05/23/15 at 10:00 am. Electronic Signature(s) Signed: 05/24/2015 11:28:44 AM By: Lorine Bears RCP, RRT, CHT Entered By: Lorine Bears on 05/24/2015 10:58:18 [name] ([id number]) -------------------------------------------------------------------------------- Vitals Details Patient Name: [name] Date of Service: 05/24/2015 10:00 AM Medical Record Number: [id number] Patient Account Number: [id number] Date of Birth/Sex: October 08, 1955 (59 y.o. Female) Treating RN: Primary Care Physician: [name] Other Clinician: Jacqulyn Bath Referring Physician: [name] Treating Physician/Extender: BURNS III, Charlean Sanfilippo in Treatment: 19 Vital Signs Time Taken: 08:40 Temperature (F): 97.6 Height (in): 68 Pulse (bpm): 84 Weight (lbs): 257 Respiratory Rate (breaths/min): 18 Body Mass Index (BMI): 39.1 Blood Pressure (mmHg): 98/62 Capillary Blood Glucose (mg/dl): 170 Reference Range: 80 - 120 mg / dl Electronic Signature(s) Signed: 05/24/2015 11:28:44 AM By: Lorine Bears RCP, RRT, CHT Entered By: Lorine Bears on 05/24/2015 08:46:33

## 2015-05-25 ENCOUNTER — Encounter: Payer: PPO | Admitting: Surgery

## 2015-05-25 DIAGNOSIS — L97512 Non-pressure chronic ulcer of other part of right foot with fat layer exposed: Secondary | ICD-10-CM | POA: Diagnosis not present

## 2015-05-25 LAB — GLUCOSE, CAPILLARY
GLUCOSE-CAPILLARY: 183 mg/dL — AB (ref 65–99)
Glucose-Capillary: 208 mg/dL — ABNORMAL HIGH (ref 65–99)

## 2015-05-25 LAB — FUNGUS CULTURE W SMEAR: Smear Result: NONE SEEN

## 2015-05-25 NOTE — Progress Notes (Addendum)
SHERRE, WOOLCOTT ([id number]) Visit Report for 05/25/2015 Chief Complaint Document Details Patient Name: [name], [name] Date of Service: 05/25/2015 8:45 AM Medical Record Number: [id number] Patient Account Number: [id number] Date of Birth/Sex: [birth year] (59 y.o. Female) Treating RN: Afful, RN, BSN, Velva Harman Primary Care Physician: [name] Other Clinician: Referring Physician: [name] Treating Physician/Extender: Frann Rider in Treatment: 62 Information Obtained from: Patient Chief Complaint Patient presents to the wound care center for a consult due non healing wound 59 year old patient who comes with a history of an ulcer on the left second toe for at least about 4 months and a history on her left heel for about 3 weeks. I needed to get an MRI of her left foot but due to her defibrillator this could not be done and hence she is getting get a triple phase bone scan. 01/17/2015 -- she has developed a significant injury to the bridge of the nose where her CPAP machine was fitting. This is a painfull dark spot. Electronic Signature(s) Signed: 05/25/2015 9:19:05 AM By: Christin Fudge MD, FACS Entered By: Christin Fudge on 05/25/2015 09:19:05 [name] ([id number]) -------------------------------------------------------------------------------- HPI Details Patient Name: [name] Date of Service: 05/25/2015 8:45 AM Medical Record Number: [id number] Patient Account Number: [id number] Date of Birth/Sex: [birth year] (59 y.o. Female) Treating RN: Afful, RN, BSN, Velva Harman Primary Care Physician: [name] Other Clinician: Referring Physician: [name] Treating Physician/Extender: Frann Rider in Treatment: 49 History of Present Illness HPI Description: 58 year old patient who is known to have diabetes for several years and generally has it under good control last hemoglobin A1c was 6.7 done 2 weeks ago. She has several other  comorbidities including acute CHF, hypoxia, hyperlipidemia, systolic heart failure, myocardial infarction, coronary artery disease, iron deficiency anemia, renal insufficiency, essential hypertension, morbid obesity and obstructive sleep apnea. 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 In the past she's had a coronary angioplasty with stent in 2013, laparoscopic cholecystectomy in 2011, bilateral oophorectomy in April 2012 and she's had some lumbar disc surgery in 1996. She says she's had a lot of peripheral neuropathy and due to this she's had a ulcer on her left second toe for a while. She had a dry eschar on her left heel and recently this was removed and she found that she had some infection and some drainage from there. She has minimal pain around the heel but no pain on any areas of her toes. she has not been on any antibiotics recently and has known vascular problems in the recent past. 01/17/2015 left foot x-ray done on April 5 reveals there are destructive changes noted of the distal aspect of the proximal phalanx of the left second digit with extension into the adjacent proximal interphalangeal joint space. These findings suggest osteomyelitis with possible septic arthritis. I was able to make a call to Dr. Nevada Crane the radiologist, and had a detailed discussion with him regarding this x-ray. He noted that there were also some changes in the fifth phalanx on the left foot and this could have been chronic osteomyelitis too. The second toe on the left leg shows findings which cannot differentiate between an acute or chronic osteomyelitis and hence he has recommended a MRI with and without contrast. We will get this organized for her and in the meanwhile treat her with oral antibiotics which is susceptible to Bactrim. She was seen yesterday by her PCP Dr. Chaya Jan who put her on 3 antibiotics which include  Levaquin and doxycycline and  Bactrim. 01/24/2015 She is scheduled for a triple PHASE bone scan on 01/26/2015. She is still on antibiotics and otherwise doing well. 01/31/2015 Bone Imaging 3 phase study -- IMPRESSION:1. There is increased uptake on all 3 phases localizing to the left hindfoot region. This may be a manifestation of osteomyelitis. Correlation with exact site of nonhealing ulcer. 2. There is increased uptake localizing to the second phalanx of the left foot on the blood pool and delayed phase images. 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 Underlying infection not excluded. I have personally discussed the x-ray studies with the radiologist Dr. Kerby Moors and after comparing all her films he agrees that the left second phalanx does not have any definite underlying infection and this is [name], [name]. ([id number]) now off a chronic arthropathy. There is a increased uptake in all 3 phases localized to the left hindfoot region and this is a manifestation of osteomyelitis. 02/07/2015 -- She was seen by the ID specialist Dr. Ola Spurr who reviewed her on my request recently. He has recommended a PICC line and IV vancomycin and oral ciprofloxacin for 4 weeks. She will be reviewed by him periodically. He also received several notes from Dr. Sonia Baller to help as documented all the requisites for hyperbaric oxygen therapy. 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 He also had a coronary angioplasty with stent placement in October 2013. Prior to that her 2-D echo showed EF of 35%. Her latest 2-D echo done on 11/01/2014 showed a LV EF of 50-55%. Systolic function and wall motion were within normal limits. Chest x-ray done in January 16, 2015 showed no acute findings. Last hemoglobin A1c done on 12/30/2014 was 6.7. CBC done on the same day was within normal limits, except for a hemoglobin  being 8.7 and a hematocrit between 29%. She had arterial duplex study done in August 2015 which showed: No evidence of segmental lower extremity arterial disease at rest, bilaterally. Normal ABI's, bilaterally. Normal great toe pressures, bilaterally. With all the above in place I believe she is a very good candidate for hyperbaric oxygen therapy and I would recommend this with the usual protocol for 6 weeks. she is going to get her PICC line this Friday and the IV antibiotics and when to start. She is also starting on oral ciprofloxacin. 03/03/2015 - recently hospitalized for anemia. No source identified. Started on amiodarone, contraindication to HBO. Reports left heel feels better. Main complaint is new buttocks ulcers. No fever or chills. Minimal drainage. 03/10/2015 -- There is a new ulceration noticed yesterday on her right second toe at the tip. She also has noticed some ulcerations on her gluteal region and we will need to take a look at this. As far as her medications go she does not know for sure what her dosage of amiodarone is exactly. She will look it up and let me know today. 03/17/2015 -- as far as her dosage of amiodarone she was taking : 02/25/15 - 03/02/15 400 mg per day; 03/03/15 - 03/10/15 200 mg per day. She stopped taking the medication on 03/10/15. She has not yet done the checks x-ray and her PFTs are still pending. Recent Lab work done on 03/13/2015 shows a glucose is 124, K+ was 3.3, her albumen was 2.9, WBC count was 3.4, hemoglobin was 9.3, hematocrit was 30.4 and platelets were 240. C-reactive protein was 33.7 which was  high and the Vancomycin trough was 18.6 which was high. 03/24/2015 -- he saw several doctors since I saw her last and they include her medical oncologist, cardiologist, neurologist and infectious disease doctor. Her PICC line is out and she now is on oral Cipro and doxycycline as per her verbal report. She has completed her pulmonary function test yesterday  and a recent chest x-ray is within normal limits. If the PFTs within normal limits she will be able to restart the hyperbaric oxygen therapy. 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 Underlying interstitial lung disease to be considered. Further recommendations were that of a CT chest and pulmonary consult if indicated. 05/11/2015 -- due to some discomfort in her ears during hyperbaric oxygen therapy, she saw ENT yesterday who basically said everything was good and continue with hyperbaric therapy. She has minimal redness of her right second toe but she is still on ciprofloxacin. [name], [name] ([id number]) 05/18/2015 -- her nose is almost completely healed and so is the sacral area. She still has an open wound on the right second toe which needs further care. He has just completed a course of ciprofloxacin. Electronic Signature(s) Signed: 05/25/2015 9:19:10 AM By: Christin Fudge MD, FACS Entered By: Christin Fudge on 05/25/2015 09:19:10 [name] ([id number]) -------------------------------------------------------------------------------- Physical Exam Details Patient Name: [name] Date of Service: 05/25/2015 8:45 AM Medical Record Number: [id number] Patient Account Number: [id number] Date of Birth/Sex: 1956/02/06 (59 y.o. Female) Treating RN: Baruch Gouty, RN, BSN, Velva Harman Primary Care Physician: [name] Other Clinician: Referring Physician: [name] Treating Physician/Extender: Frann Rider in Treatment: 19 Constitutional . Pulse regular. Respirations normal and unlabored. Afebrile. . Eyes Nonicteric. Reactive to light. Ears, Nose, Mouth, and Throat Lips, teeth, and gums WNL.Marland Kitchen Moist mucosa without lesions . Neck supple and nontender. No palpable supraclavicular or cervical adenopathy. Normal sized without goiter. Respiratory WNL.  No retractions.. Breath sounds WNL, No rubs, rales, rhonchi, or wheeze.. Cardiovascular Heart rhythm and rate regular, no murmur or gallop.. Pedal Pulses WNL. No clubbing, cyanosis or edema. Chest Breasts symmetical and no nipple discharge.. Breast tissue WNL, no masses, lumps, or tenderness.. Lymphatic No adneopathy. No adenopathy. No adenopathy. Musculoskeletal Adexa without tenderness or enlargement.. Digits and nails w/o clubbing, cyanosis, infection, petechiae, ischemia, or inflammatory conditions.. Integumentary (Hair, Skin) No suspicious lesions. No crepitus or fluctuance. No peri-wound warmth or erythema. No masses.Marland Kitchen Psychiatric Judgement and insight Intact.. No evidence of depression, anxiety, or agitation.. Notes the wound on the sacrum and on the nose is completely healed. The right second toe has now minimal eschar looks clean and is smaller and no debridement is required today. Electronic Signature(s) Signed: 05/25/2015 9:19:39 AM By: Christin Fudge MD, FACS Entered By: Christin Fudge on 05/25/2015 09:19:38 [name] ([id number]) -------------------------------------------------------------------------------- Physician Orders Details Patient Name: [name] Date of Service: 05/25/2015 8:45 AM Medical Record Number: [id number] Patient Account Number: [id number] Date of Birth/Sex: [birth year] (59 y.o. Female) Treating RN: Afful, RN, BSN, Velva Harman Primary Care Physician: [name] Other Clinician: Referring Physician: [name] Treating Physician/Extender: Frann Rider in Treatment: 55 Verbal / Phone Orders: Yes Clinician: Afful, RN, BSN, Rita Read Back and Verified: Yes Diagnosis Coding ICD-10 Coding Code Description E11.621 Type 2 diabetes mellitus with foot ulcer [id number] Chronic systolic (congestive) heart failure E11.42 Type 2 diabetes mellitus with diabetic polyneuropathy S00.33XA Contusion of nose, initial encounter M86.372 Chronic  multifocal osteomyelitis, left  ankle and foot 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, stage 3 L89.323 Pressure ulcer of left buttock, stage 3 L97.512 Non-pressure chronic ulcer of other part of right foot with fat layer exposed Wound Cleansing Wound #7 Right Toe Second o Cleanse wound with mild soap and water Anesthetic Wound #7 Right Toe Second o Topical Lidocaine 4% cream applied to wound bed prior to debridement Primary Wound Dressing Wound #7 Right Toe Second o Prisma Ag Secondary Dressing Wound #7 Right Toe Second o Gauze and Kerlix/Conform Dressing Change Frequency Wound #7 Right Toe Second o Change dressing every other day. Follow-up Appointments Wound #7 Right Toe Second [name], [name] ([id number]) o Return Appointment in 1 week. Off-Loading Wound #7 Right Toe Second o Open toe surgical shoe with peg assist. Additional Orders / Instructions Wound #7 Right Toe Second o Increase protein intake. o Activity as tolerated Home Health Wound #7 Right Toe Second o [facility] Visits o Home Health Nurse may visit PRN to address patientos wound care needs. o FACE TO FACE ENCOUNTER: MEDICARE and MEDICAID PATIENTS: I certify that this patient is under my care and that I had a face-to-face encounter that meets the physician face-to-face encounter requirements with this patient on this date. The encounter with the patient was in whole or in part for the following MEDICAL CONDITION: (primary reason for [place]) MEDICAL NECESSITY: I certify, that based on my findings, NURSING services are a medically necessary home health service. HOME BOUND STATUS: I certify that my clinical findings support that this patient is homebound (i.e., Due to illness or injury, pt requires aid of supportive devices such as crutches, cane, wheelchairs, walkers, the use of special transportation or the assistance of another person to leave their place of  residence. There is a normal inability to leave the home and doing so requires considerable and taxing effort. Other absences are for medical reasons / religious services and are infrequent or of short duration when for other reasons). o If current dressing causes regression in wound condition, may D/C ordered dressing product/s and apply Normal Saline Moist Dressing daily until next [place] / Other MD appointment. [place] of regression in wound condition at [phone]. o Please direct any NON-WOUND related issues/requests for orders to patient's Primary Care Physician Electronic Signature(s) Signed: 05/25/2015 9:25:40 AM By: Regan Lemming BSN, RN Signed: 05/25/2015 1:56:54 PM By: Christin Fudge MD, FACS Entered By: Regan Lemming on 05/25/2015 09:25:39 [name] ([id number]) -------------------------------------------------------------------------------- Problem List Details Patient Name: [name] Date of Service: 05/25/2015 8:45 AM Medical Record Number: [id number] Patient Account Number: [id number] Date of Birth/Sex: January 27, 1956 (59 y.o. Female) Treating RN: Baruch Gouty, RN, BSN, Velva Harman Primary Care Physician: [name] Other Clinician: Referring Physician: [name] Treating Physician/Extender: Frann Rider in Treatment: 29 Active Problems ICD-10 Encounter Code Description Active Date Diagnosis E11.621 Type 2 diabetes mellitus with foot ulcer 01/10/2015 Yes [id number] Chronic systolic (congestive) heart failure 01/10/2015 Yes E11.42 Type 2 diabetes mellitus with diabetic polyneuropathy 01/10/2015 Yes S00.33XA Contusion of nose, initial encounter 01/17/2015 Yes 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, left ankle and foot 02/13/2015 Yes 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, stage 3 03/03/2015 Yes L89.323 Pressure ulcer of left buttock, stage 3 03/03/2015 Yes L97.512 Non-pressure chronic ulcer of other part of right foot with 03/10/2015  Yes fat layer exposed Inactive Problems Resolved Problems ICD-10 Code Description Active Date Resolved Date L97.422 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 01/10/2015 01/10/2015 layer exposed Erda,  FERRYN KIESOW ([id number]) L97.522 Non-pressure chronic ulcer of other part of left foot with fat 01/10/2015 01/10/2015 layer exposed L02.611 Cutaneous abscess of right foot 04/20/2015 04/20/2015 Electronic Signature(s) Signed: 05/25/2015 9:18:59 AM By: Christin Fudge MD, FACS Entered By: Christin Fudge on 05/25/2015 09:18:58 [name] ([id number]) -------------------------------------------------------------------------------- Progress Note Details Patient Name: [name] Date of Service: 05/25/2015 8:45 AM Medical Record Number: [id number] Patient Account Number: [id number] Date of Birth/Sex: June 12, 1956 (59 y.o. Female) Treating RN: Afful, RN, BSN, Velva Harman Primary Care Physician: [name] Other Clinician: Referring Physician: [name] Treating Physician/Extender: Frann Rider in Treatment: 38 Subjective Chief Complaint Information obtained from Patient Patient presents to the wound care center for a consult due non healing wound 59 year old patient who comes with a history of an ulcer on the left second toe for at least about 4 months and a history on her left heel for about 3 weeks. I needed to get an MRI of her left foot but due to her defibrillator this could not be done and hence she is getting get a triple phase bone scan. 01/17/2015 -- she has developed a significant injury to the bridge of the nose where her CPAP machine was fitting. This is a painfull dark spot. History of Present Illness (HPI) 59 year old patient who is known to have diabetes for several years and generally has it under good control last hemoglobin A1c was 6.7 done 2 weeks ago. She has several other comorbidities including acute CHF, hypoxia, hyperlipidemia, systolic  heart failure, myocardial infarction, coronary artery disease, iron deficiency anemia, renal insufficiency, essential hypertension, morbid obesity and obstructive sleep apnea. 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 In the past she's had a coronary angioplasty with stent in 2013, laparoscopic cholecystectomy in 2011, bilateral oophorectomy in April 2012 and she's had some lumbar disc surgery in 1996. She says she's had a lot of peripheral neuropathy and due to this she's had a ulcer on her left second toe for a while. She had a dry eschar on her left heel and recently this was removed and she found that she had some infection and some drainage from there. She has minimal pain around the heel but no pain on any areas of her toes. she has not been on any antibiotics recently and has known vascular problems in the recent past. 01/17/2015 left foot x-ray done on April 5 reveals there are destructive changes noted of the distal aspect of the proximal phalanx of the left second digit with extension into the adjacent proximal interphalangeal joint space. These findings suggest osteomyelitis with possible septic arthritis. I was able to make a call to Dr. Nevada Crane the radiologist, and had a detailed discussion with him regarding this x-ray. He noted that there were also some changes in the fifth phalanx on the left foot and this could have been chronic osteomyelitis too. The second toe on the left leg shows findings which cannot differentiate between an acute or chronic osteomyelitis and hence he has recommended a MRI with and without contrast. We will get this organized for her and in the meanwhile treat her with oral antibiotics which is susceptible to Bactrim. She was seen yesterday by her PCP Dr. Chaya Jan who put her on 3 antibiotics which include Levaquin and doxycycline and Bactrim. 01/24/2015 She is scheduled for a triple PHASE bone scan on  01/26/2015. She is still on antibiotics and otherwise doing well. [name], [name] ([id number]) 01/31/2015 Bone Imaging 3  phase study -- IMPRESSION:1. There is increased uptake on all 3 phases localizing to the left hindfoot region. This may be a manifestation of osteomyelitis. Correlation with exact site of nonhealing ulcer. 2. There is increased uptake localizing to the second phalanx of the left foot on the blood pool and delayed phase images. 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 Underlying infection not excluded. I have personally discussed the x-ray studies with the radiologist Dr. Kerby Moors and after comparing all her films he agrees that the left second phalanx does not have any definite underlying infection and this is now off a chronic arthropathy. There is a increased uptake in all 3 phases localized to the left hindfoot region and this is a manifestation of osteomyelitis. 02/07/2015 -- She was seen by the ID specialist Dr. Ola Spurr who reviewed her on my request recently. He has recommended a PICC line and IV vancomycin and oral ciprofloxacin for 4 weeks. She will be reviewed by him periodically. He also received several notes from Dr. Sonia Baller to help as documented all the requisites for hyperbaric oxygen therapy. 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 He also had a coronary angioplasty with stent placement in October 2013. Prior to that her 2-D echo showed EF of 35%. Her latest 2-D echo done on 11/01/2014 showed a LV EF of 50-55%. Systolic function and wall motion were within normal limits. Chest x-ray done in January 16, 2015 showed no acute findings. Last hemoglobin A1c done on 12/30/2014 was 6.7. CBC done on the same day was within normal limits, except for a hemoglobin being 8.7 and a hematocrit between 29%. She had arterial duplex study  done in August 2015 which showed: No evidence of segmental lower extremity arterial disease at rest, bilaterally. Normal ABI's, bilaterally. Normal great toe pressures, bilaterally. With all the above in place I believe she is a very good candidate for hyperbaric oxygen therapy and I would recommend this with the usual protocol for 6 weeks. she is going to get her PICC line this Friday and the IV antibiotics and when to start. She is also starting on oral ciprofloxacin. 03/03/2015 - recently hospitalized for anemia. No source identified. Started on amiodarone, contraindication to HBO. Reports left heel feels better. Main complaint is new buttocks ulcers. No fever or chills. Minimal drainage. 03/10/2015 -- There is a new ulceration noticed yesterday on her right second toe at the tip. She also has noticed some ulcerations on her gluteal region and we will need to take a look at this. As far as her medications go she does not know for sure what her dosage of amiodarone is exactly. She will look it up and let me know today. 03/17/2015 -- as far as her dosage of amiodarone she was taking : 02/25/15 - 03/02/15 400 mg per day; 03/03/15 - 03/10/15 200 mg per day. She stopped taking the medication on 03/10/15. She has not yet done the checks x-ray and her PFTs are still pending. Recent Lab work done on 03/13/2015 shows a glucose is 124, K+ was 3.3, her albumen was 2.9, WBC count was 3.4, hemoglobin was 9.3, hematocrit was 30.4 and platelets were 240. C-reactive protein was 33.7 which was high and the Vancomycin trough was 18.6 which was high. 03/24/2015 -- he saw several doctors since I saw her last and they include her medical oncologist, cardiologist, neurologist and infectious disease doctor. Her PICC  line is out and she now is on oral Cipro and doxycycline as per her verbal report. She has completed her pulmonary function test yesterday and a [name], [name]. ([id number]) recent chest x-ray is within  normal limits. If the PFTs within normal limits she will be able to restart the hyperbaric oxygen therapy. 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 Underlying interstitial lung disease to be considered. Further recommendations were that of a CT chest and pulmonary consult if indicated. 05/11/2015 -- due to some discomfort in her ears during hyperbaric oxygen therapy, she saw ENT yesterday who basically said everything was good and continue with hyperbaric therapy. She has minimal redness of her right second toe but she is still on ciprofloxacin. 05/18/2015 -- her nose is almost completely healed and so is the sacral area. She still has an open wound on the right second toe which needs further care. He has just completed a course of ciprofloxacin. Objective Constitutional Pulse regular. Respirations normal and unlabored. Afebrile. Vitals Time Taken: 9:01 AM, Height: 68 in, Weight: 257 lbs, BMI: 39.1, Temperature: 97.7 F, Pulse: 87 bpm, Respiratory Rate: 18 breaths/min, Blood Pressure: 110/68 mmHg. Eyes Nonicteric. Reactive to light. Ears, Nose, Mouth, and Throat Lips, teeth, and gums WNL.Marland Kitchen Moist mucosa without lesions . Neck supple and nontender. No palpable supraclavicular or cervical adenopathy. Normal sized without goiter. Respiratory WNL. No retractions.. Breath sounds WNL, No rubs, rales, rhonchi, or wheeze.. Cardiovascular Heart rhythm and rate regular, no murmur or gallop.. Pedal Pulses WNL. No clubbing, cyanosis or edema. Chest Breasts symmetical and no nipple discharge.. Breast tissue WNL, no masses, lumps, or tenderness.. Lymphatic No adneopathy. No adenopathy. No adenopathy. Musculoskeletal Adexa without tenderness or enlargement.. Digits and nails w/o clubbing, cyanosis, infection, petechiae, Yuan, Vickie S. ([id number]) ischemia, or inflammatory  conditions.Marland Kitchen Psychiatric Judgement and insight Intact.. No evidence of depression, anxiety, or agitation.. General Notes: the wound on the sacrum and on the nose is completely healed. The right second toe has now minimal eschar looks clean and is smaller and no debridement is required today. Integumentary (Hair, Skin) No suspicious lesions. No crepitus or fluctuance. No peri-wound warmth or erythema. No masses.. Wound #4 status is Healed - Epithelialized. Original cause of wound was Pressure Injury. The wound is located on the Medial Coccyx. The wound measures 0cm length x 0cm width x 0cm depth; 0cm^2 area and 0cm^3 volume. The wound is limited to skin breakdown. There is no tunneling or undermining noted. There is a none present amount of drainage noted. The wound margin is flat and intact. There is no granulation within the wound bed. There is no necrotic tissue within the wound bed. The periwound skin appearance did not exhibit: Callus, Crepitus, Excoriation, Fluctuance, Friable, Induration, Localized Edema, Rash, Scarring, Dry/Scaly, Maceration, Moist, Atrophie Blanche, Cyanosis, Ecchymosis, Hemosiderin Staining, Mottled, Pallor, Rubor, Erythema. Periwound temperature was noted as No Abnormality. Wound #7 status is Open. Original cause of wound was Gradually Appeared. The wound is located on the Right Toe Second. The wound measures 0.6cm length x 0.8cm width x 0.1cm depth; 0.377cm^2 area and 0.038cm^3 volume. The wound is limited to skin breakdown. There is no tunneling noted. There is a small amount of serous drainage noted. The wound margin is flat and intact. There is medium (34-66%) red, pink granulation within the wound bed. There is a medium (34-66%) amount of necrotic tissue within the wound bed including Adherent  Slough. The periwound skin appearance exhibited: Callus, Moist. The periwound skin appearance did not exhibit: Crepitus, Excoriation, Fluctuance, Friable, Induration,  Localized Edema, Rash, Scarring, Dry/Scaly, Maceration, Atrophie Blanche, Cyanosis, Ecchymosis, Hemosiderin Staining, Mottled, Pallor, Rubor, Erythema. Periwound temperature was noted as No Abnormality. 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 ([id number]) The patient is doing extremely well and we will continue with hyperbaric oxygen therapy which seems to be benefiting her greatly. We will use Prisma Ag. on the right second toe and a PEG assist off loading shoe. She will come back and see me next week. Plan Wound Cleansing: Wound #7 Right Toe Second: Cleanse wound with mild soap and water Anesthetic: Wound #7 Right Toe Second: Topical Lidocaine 4% cream applied to wound bed prior to debridement Primary Wound Dressing: Wound #7 Right Toe Second: Prisma Ag Secondary Dressing: Wound #7 Right Toe Second: Gauze and Kerlix/Conform Dressing Change Frequency: Wound #7 Right Toe Second: Change dressing every other day. Follow-up Appointments: Wound #7 Right Toe Second: Return Appointment in 1 week. Off-Loading: Wound #7 Right Toe Second: Open toe surgical shoe with peg assist. Additional Orders / Instructions: Wound #7 Right Toe Second: Increase protein intake. Activity as tolerated Home Health: Wound #7 Right Toe Second: [place] Nurse may visit PRN to address patient s wound care needs. FACE TO FACE ENCOUNTER: MEDICARE and MEDICAID PATIENTS: I certify that this patient is  under my care and that I had a face-to-face encounter that meets the physician face-to-face encounter requirements with this patient on this date. The encounter with the patient was in whole or in part for the following MEDICAL CONDITION: (primary reason for [place]) MEDICAL NECESSITY: I certify, that based on my findings, NURSING services are a medically necessary home health service. HOME BOUND STATUS: I certify that my clinical findings support that this patient is homebound (i.e., Due to illness or injury, pt requires aid of supportive devices such as crutches, cane, wheelchairs, walkers, the use of special transportation or the assistance of another person to leave their place of residence. There is a normal inability to leave the home and doing so requires considerable and taxing effort. Other absences are for medical reasons / religious services and are infrequent or of short duration when for other reasons). If current dressing causes regression in wound condition, may D/C ordered dressing product/s and apply [name], [name]. ([id number]) Normal Saline Moist Dressing daily until next [place] / Other MD appointment. [place] of regression in wound condition at [phone]. Please direct any NON-WOUND related issues/requests for orders to patient's Primary Care Physician The patient is doing extremely well and we will continue with hyperbaric oxygen therapy which seems to be benefiting her greatly. We will use Prisma Ag. on the right second toe and a PEG assist off loading shoe. She will come back and see me next week. Electronic Signature(s) Signed: 05/25/2015 4:28:40 PM By: Christin Fudge MD, FACS Previous Signature: 05/25/2015 4:28:22 PM Version By: Christin Fudge MD, FACS Previous Signature: 05/25/2015 9:21:08 AM Version By: Con Memos  Roderick Pee MD, FACS Entered By: Christin Fudge on 05/25/2015 16:28:39 [name]  ([id number]) -------------------------------------------------------------------------------- [place] Details Patient Name: [name] Date of Service: 05/25/2015 Medical Record Number: [id number] Patient Account Number: [id number] Date of Birth/Sex: 04/21/1956 (59 y.o. 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, 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, stage 3 L89.323 Pressure ulcer of left buttock, stage 3 L97.512 Non-pressure chronic ulcer of other part of right foot with fat layer exposed Facility Procedures CPT4 Code: [id number] Description: [id number] - WOUND CARE VISIT-LEV 2 EST PT Modifier: Quantity: 1 Physician Procedures CPT4 Code Description: [id number] - WC PHYS LEVEL 3 - EST PT ICD-10 Description Diagnosis E11.621 Type 2 diabetes mellitus with foot ulcer 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

## 2015-05-25 NOTE — Progress Notes (Signed)
MAIZA, CZARNIAK ([id number]) Visit Report for 05/24/2015 HBO Details Patient Name: [name], [name] Date of Service: 05/24/2015 10:00 AM Medical Record Number: [id number] Patient Account Number: [id number] Date of Birth/Sex: Jan 25, 1956 (59 y.o. 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 HBO Treatment Details Treatment Number: 26 Patient Type: Outpatient Chamber Type: Monoplace Chamber #: HBO [id number] Treatment Protocol: 2.0 ATA with 90 minutes oxygen, and no air breaks Treatment Details Compression Rate Down: 1.5 psi / minute De-Compression Rate Up: 1.5 psi / minute Air breaks and breathing Compress Tx Pressure Decompress Decompress periods Begins Reached Begins Ends (leave unused spaces blank) Chamber Pressure 1 ATA 2.0 ATA - - - - - - 2.0 ATA 1 ATA Clock Time (24 hr) 08:56 09:06 - - - - - - 10:37 10:47 Treatment Length: 111 (minutes) Treatment Segments: 4 Capillary Blood Glucose Pre Capillary Blood Glucose (mg/dl): Post Capillary Blood Glucose (mg/dl): Vital Signs Capillary Blood Glucose Reference Range: 80 - 120 mg / dl HBO Diabetic Blood Glucose Intervention Range: <131 mg/dl or >249 mg/dl Time Vitals Blood Respiratory Capillary Blood Glucose Pulse Action Type: Pulse: Temperature: Taken: Pressure: Rate: Glucose (mg/dl): Meter #: Oximetry (%) Taken: Pre 08:40 98/62 84 18 97.6 170 1 none Post 10:52 144/80 78 18 97.6 167 1 none Treatment Response Well [name], [name] ([id number]) Treatment Toleration: Treatment Treatment Completed without Adverse Event Completion Status: HBO  Attestation I certify that I supervised this HBO treatment in accordance with Medicare guidelines. A trained Yes emergency response team is readily available per hospital policies and procedures. Continue HBOT as ordered. Yes Electronic Signature(s) Signed: 05/24/2015 4:05:14 PM By: Loletha Grayer MD Previous Signature: 05/24/2015 11:28:44 AM Version By: Lorine Bears RCP, RRT, CHT Entered By: Loletha Grayer on 05/24/2015 13:06:55 [name] ([id number]) -------------------------------------------------------------------------------- HBO Safety Checklist Details Patient Name: [name] Date of Service: 05/24/2015 10:00 AM Medical Record Number: [id number] Patient Account Number: [id number] Date of Birth/Sex: [birth year] (59 y.o. Female) Treating RN: Primary Care Physician: [name] Other Clinician: Jacqulyn Bath Referring Physician: [name] Treating Physician/Extender: BURNS III, Charlean Sanfilippo in Treatment: 19 HBO Safety Checklist Items Safety Checklist Consent Form Signed Patient voided / foley secured and emptied When did you last eato 20:00 pm on 05/23/15 Last dose of injectable or oral agent 08:00 am Ostomy pouch emptied and vented if applicable n/a All implantable devices assessed, documented and approved ICD Intravenous access site secured and place n/a Valuables secured Linens and cotton and cotton/polyester blend (less than 51% polyester) Personal oil-based products / skin lotions / body lotions removed Wigs or hairpieces removed Smoking or tobacco materials removed Books / newspapers / magazines / loose paper removed Cologne, aftershave, perfume and deodorant removed Jewelry removed (may wrap wedding band) Make-up removed Hair care products removed Battery operated devices (external) removed Heating patches and chemical warmers removed Titanium eyewear removed n/a Nail polish cured greater than 10 hours n/a Casting  material cured greater than 10 hours n/a Hearing aids removed n/a Loose dentures or partials removed n/a Prosthetics have been removed n/a Patient demonstrates correct use of air break device (if applicable) Patient concerns have been addressed Patient grounding bracelet on and cord attached to chamber Specifics for Inpatients (complete in  addition to above) Medication sheet sent with patient Intravenous medications needed or due during therapy sent with patient [name], [name] ([id number]) Drainage tubes (e.g. nasogastric tube or chest tube secured and vented) Endotracheal or Tracheotomy tube secured Cuff deflated of air and inflated with saline Airway suctioned Electronic Signature(s) Signed: 05/24/2015 11:28:44 AM By: Lorine Bears RCP, RRT, CHT Entered By: Lorine Bears on 05/24/2015 08:47:41

## 2015-05-25 NOTE — Progress Notes (Signed)
[name], [name] ([id number]) Visit Report for 05/25/2015 Arrival Information Details Patient Name: [name], [name] Date of Service: 05/25/2015 8:45 AM Medical Record Number: [id number] Patient Account Number: [id number] Date of Birth/Sex: 1956-01-09 (59 y.o. Female) Treating RN: Afful, RN, BSN, Velva Harman Primary Care Physician: [name] Other Clinician: Referring Physician: [name] Treating Physician/Extender: Frann Rider in Treatment: 6 Visit Information History Since Last Visit Any new allergies or adverse reactions: No Patient Arrived: Wheel Chair Had a fall or experienced change in No Arrival Time: 08:50 activities of daily living that may affect Accompanied By: SPOUSE risk of falls: Transfer Assistance: None Signs or symptoms of abuse/neglect since last No Patient Identification Verified: Yes visito Secondary Verification Process Yes Hospitalized since last visit: No Completed: Has Dressing in Place as Prescribed: Yes Patient Requires Transmission- No Pain Present Now: No Based Precautions: Patient Has Alerts: Yes Patient Alerts: Patient on Blood Thinner ABI L:0.8, R:0.93 Electronic Signature(s) Signed: 05/25/2015 9:01:40 AM By: Regan Lemming BSN, RN Entered By: Regan Lemming on 05/25/2015 09:01:40 [name] ([id number]) -------------------------------------------------------------------------------- Clinic Level of Care Assessment Details Patient Name: [name] Date of Service: 05/25/2015 8:45 AM Medical Record Number: [id number] Patient Account Number: [id number] Date of Birth/Sex: 1955-11-05 (59 y.o. Female) Treating RN: Afful, RN, BSN, [place] Primary Care Physician: [name] Other Clinician: Referring Physician: [name] Treating Physician/Extender: Frann Rider in Treatment: 19 Clinic Level of Care Assessment Items TOOL 4 Quantity Score []  - Use when only an EandM is performed on FOLLOW-UP visit 0 ASSESSMENTS  - Nursing Assessment / Reassessment X - Reassessment of Co-morbidities (includes updates in patient status) 1 10 X - Reassessment of Adherence to Treatment Plan 1 5 ASSESSMENTS - Wound and Skin Assessment / Reassessment X - Simple Wound Assessment / Reassessment - one wound 1 5 []  - Complex Wound Assessment / Reassessment - multiple wounds 0 []  - Dermatologic / Skin Assessment (not related to wound area) 0 ASSESSMENTS - Focused Assessment []  - Circumferential Edema Measurements - multi extremities 0 []  - Nutritional Assessment / Counseling / Intervention 0 X - Lower Extremity Assessment (monofilament, tuning fork, pulses) 1 5 []  - Peripheral Arterial Disease Assessment (using hand held doppler) 0 ASSESSMENTS - Ostomy and/or Continence Assessment and Care []  - Incontinence Assessment and Management 0 []  - Ostomy Care Assessment and Management (repouching, etc.) 0 PROCESS - Coordination of Care X - Simple Patient / Family Education for ongoing care 1 15 []  - Complex (extensive) Patient / Family Education for ongoing care 0 []  - Staff obtains Programmer, systems, Records, Test Results / Process Orders 0 []  - Staff telephones HHA, Nursing Homes / Clarify orders / etc 0 []  - Routine Transfer to another Facility (non-emergent condition) 0 [name], [name] ([id number]) []  - Routine Hospital Admission (non-emergent condition) 0 []  - New Admissions / Biomedical engineer / Ordering NPWT, Apligraf, etc. 0 []  - Emergency Hospital Admission (emergent condition) 0 []  - Simple Discharge Coordination 0 []  - Complex (extensive) Discharge Coordination 0 PROCESS - Special Needs []  - Pediatric / Minor Patient Management 0 []  - Isolation Patient Management 0 []  - Hearing / Language / Visual special needs 0 []  - Assessment of Community assistance (transportation, D/C planning, etc.) 0 []  - Additional assistance / Altered mentation 0 []  - Support Surface(s) Assessment (bed, cushion, seat, etc.)  0 INTERVENTIONS - Wound Cleansing / Measurement X - Simple Wound Cleansing - one wound 1 5 []  - Complex Wound Cleansing - multiple wounds 0 X - Wound Imaging (  photographs - any number of wounds) 1 5 []  - Wound Tracing (instead of photographs) 0 X - Simple Wound Measurement - one wound 1 5 []  - Complex Wound Measurement - multiple wounds 0 INTERVENTIONS - Wound Dressings X - Small Wound Dressing one or multiple wounds 1 10 []  - Medium Wound Dressing one or multiple wounds 0 []  - Large Wound Dressing one or multiple wounds 0 []  - Application of Medications - topical 0 []  - Application of Medications - injection 0 INTERVENTIONS - Miscellaneous []  - External ear exam 0 [name], [name] ([id number]) []  - Specimen Collection (cultures, biopsies, blood, body fluids, etc.) 0 []  - Specimen(s) / Culture(s) sent or taken to Lab for analysis 0 []  - Patient Transfer (multiple staff / Harrel Lemon Lift / Similar devices) 0 []  - Simple Staple / Suture removal (25 or less) 0 []  - Complex Staple / Suture removal (26 or more) 0 []  - Hypo / Hyperglycemic Management (close monitor of Blood Glucose) 0 []  - Ankle / Brachial Index (ABI) - do not check if billed separately 0 []  - Vital Signs 0 Has the patient been seen at the hospital within the last three years: Yes Total Score: 65 Level Of Care: New/Established - Level 2 Electronic Signature(s) Signed: 05/25/2015 9:26:14 AM By: Regan Lemming BSN, RN Entered By: Regan Lemming on 05/25/2015 09:26:14 [name] ([id number]) -------------------------------------------------------------------------------- Lower Extremity Assessment Details Patient Name: [name] Date of Service: 05/25/2015 8:45 AM Medical Record Number: [id number] Patient Account Number: [id number] Date of Birth/Sex: 1956-06-02 (59 y.o. Female) Treating RN: Afful, RN, BSN, Velva Harman Primary Care Physician: [name] Other Clinician: Referring Physician: [name] Treating  Physician/Extender: Frann Rider in Treatment: 19 Vascular Assessment Pulses: Posterior Tibial Dorsalis Pedis Palpable: [Right:Yes] Extremity colors, hair growth, and conditions: Extremity Color: [Right:Normal] Hair Growth on Extremity: [Right:Yes] Temperature of Extremity: [Right:Warm] Capillary Refill: [Right:< 3 seconds] Toe Nail Assessment Left: Right: Thick: No Discolored: No Deformed: No Improper Length and Hygiene: No Electronic Signature(s) Signed: 05/25/2015 9:02:45 AM By: Regan Lemming BSN, RN Entered By: Regan Lemming on 05/25/2015 09:02:45 [name] ([id number]) -------------------------------------------------------------------------------- Multi Wound Chart Details Patient Name: [name] Date of Service: 05/25/2015 8:45 AM Medical Record Number: [id number] Patient Account Number: [id number] Date of Birth/Sex: 1956/06/20 (59 y.o. 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] [N/A:N/A] Comorbid History: [4:Anemia, Arrhythmia, Congestive Heart Failure, Congestive Heart Failure, Hypertension, Type II Diabetes] [7:Anemia, Arrhythmia, Hypertension, Type II Diabetes] [N/A:N/A] Date Acquired: [4:02/21/2015] [7:03/10/2015] [N/A:N/A] Weeks of Treatment: [4:11] [7:10] [N/A:N/A] Wound Status: [4:Healed - Epithelialized] [7:Open] [N/A:N/A] Measurements L x W x D 0x0x0  [7:0.6x0.8x0.1] [N/A:N/A] (cm) Area (cm) : [4:0] [7:0.377] [N/A:N/A] Volume (cm) : [4:0] [7:0.038] [N/A:N/A] % Reduction in Area: [4:100.00%] [7:53.90%] [N/A:N/A] % Reduction in Volume: 100.00% [7:53.70%] [N/A:N/A] Classification: [4:Category/Stage III] [7:Grade 1] [N/A:N/A] Exudate Amount: [4:None Present] [7:Small] [N/A:N/A] Exudate Type: [4:N/A] [7:Serous] [N/A:N/A] Exudate Color: [4:N/A] [7:amber] [N/A:N/A] Wound Margin: [4:Flat and Intact] [7:Flat and Intact] [N/A:N/A] Granulation Amount: [4:None Present (0%)] [7:Medium (34-66%)] [N/A:N/A] Granulation Quality: [4:N/A] [7:Red, Pink] [N/A:N/A] Necrotic Amount: [4:None Present (0%)] [  7:Medium (34-66%)] [N/A:N/A] Exposed Structures: [4:Fascia: No Fat: No Tendon: No Muscle: No] [7:Fascia: No Fat: No Tendon: No Muscle: No] [N/A:N/A] Joint: No Joint: No Bone: No Bone: No Limited to Skin Limited to Skin Breakdown Breakdown Epithelialization: Large (67-100%) Small (1-33%) N/A Periwound Skin Texture: Edema: No Callus: Yes N/A Excoriation: No Edema: No Induration: No Excoriation: No Callus: No Induration: No Crepitus: No Crepitus: No Fluctuance: No Fluctuance: No Friable: No Friable: No Rash: No Rash: No Scarring: No Scarring: No Periwound Skin Maceration: No Moist: Yes N/A Moisture: Moist: No Maceration: No Dry/Scaly: No Dry/Scaly: No Periwound Skin Color: Atrophie Blanche: No Atrophie Blanche: No N/A Cyanosis: No Cyanosis: No Ecchymosis: No Ecchymosis: No Erythema: No Erythema: No Hemosiderin Staining: No Hemosiderin Staining: No Mottled: No Mottled: No Pallor: No Pallor: No Rubor: No Rubor: No Temperature: No Abnormality No Abnormality N/A Tenderness on No No N/A Palpation: Wound Preparation: Ulcer Cleansing: Ulcer Cleansing: N/A Rinsed/Irrigated with Rinsed/Irrigated with Saline Saline Topical Anesthetic Topical Anesthetic Applied: None Applied: Other: lidocaine 4% Treatment  Notes Electronic Signature(s) Signed: 05/25/2015 9:25:09 AM By: Regan Lemming BSN, RN Entered By: Regan Lemming on 05/25/2015 09:25:09 [name] ([id number]) -------------------------------------------------------------------------------- [place] Details Patient Name: [name] Date of Service: 05/25/2015 8:45 AM Medical Record Number: [id number] Patient Account Number: [id number] Date of Birth/Sex: [birth year] (59 y.o. Female) Treating RN: Afful, RN, BSN, Velva Harman Primary Care Physician: [name] Other Clinician: Referring Physician: [name] Treating Physician/Extender: Frann Rider in Treatment: 64 Active Inactive Abuse / Safety / Falls / Self Care Management Nursing Diagnoses: Impaired physical mobility Potential for falls Self care deficit: actual or potential Goals: Patient/caregiver will verbalize understanding of skin care regimen Date Initiated: 01/10/2015 Goal Status: Active Patient/caregiver will verbalize/demonstrate measures taken to improve the patient's personal safety Date Initiated: 01/10/2015 Goal Status: Active Patient/caregiver will verbalize/demonstrate measures taken to prevent injury and/or falls Date Initiated: 01/10/2015 Goal Status: Active Patient/caregiver will verbalize/demonstrate understanding of what to do in case of emergency Date Initiated: 01/10/2015 Goal Status: Active Interventions: Assess fall risk on admission and as needed Assess impairment of mobility on admission and as needed per policy Provide education on basic hygiene Provide education on fall prevention Provide education on personal and home safety Provide education on safe transfers Treatment Activities: Education provided on Basic Hygiene : 01/10/2015 Notes: Nutrition [name], [name] ([id number]) Nursing Diagnoses: Impaired glucose control: actual or potential Potential for alteratiion in Nutrition/Potential for imbalanced  nutrition Goals: Patient/caregiver agrees to and verbalizes understanding of need to use nutritional supplements and/or vitamins as prescribed Date Initiated: 01/10/2015 Goal Status: Active Interventions: Assess HgA1c results as ordered upon admission and as needed Treatment Activities: Patient referred to Primary Care Physician for further nutritional evaluation : 05/25/2015 Notes: Orientation to the Wound Care Program Nursing Diagnoses: Knowledge deficit related to the wound healing center program Goals: Patient/caregiver will verbalize understanding of the [facility] Date Initiated: 01/10/2015 Goal Status: Active Interventions: Provide education on orientation to the wound center Notes: Osteomyelitis Nursing Diagnoses: Potential for infection: osteomyelitis Goals: Diagnostic evaluation for osteomyelitis completed as ordered Date Initiated: 01/17/2015 Goal Status: Active Patient/caregiver will verbalize understanding of disease process and disease management Date Initiated: 01/17/2015 Goal Status: Active Signs and symptoms for osteomyelitis will be recognized and promptly addressed [name], [name] ([id number]) Date Initiated: 01/17/2015 Goal Status: Active Interventions: Assess for signs and symptoms of osteomyelitis resolution every visit Provide education on osteomyelitis Treatment Activities: MRI : 05/25/2015 Test ordered outside of clinic : 05/25/2015 X-ray :  05/25/2015 Notes: Soft Tissue Infection Nursing Diagnoses: Knowledge deficit related to disease process and management Potential for infection: soft tissue Goals: Patient/caregiver will verbalize understanding of or measures to prevent infection and contamination in the home setting Date Initiated: 01/17/2015 Goal Status: Active Patient's soft tissue infection will resolve Date Initiated: 01/17/2015 Goal Status: Active Signs and symptoms of infection will be recognized early to allow for  prompt treatment Date Initiated: 01/17/2015 Goal Status: Active Interventions: Assess signs and symptoms of infection every visit Provide education on infection Screen for HBO Treatment Activities: Culture : 05/25/2015 Culture and sensitivity : 05/25/2015 Education provided on Infection : 04/20/2015 Systemic antibiotics : 05/25/2015 Test ordered outside of clinic : 05/25/2015 Notes: [name], [name] ([id number]) Wound/Skin Impairment Nursing Diagnoses: Impaired tissue integrity Knowledge deficit related to ulceration/compromised skin integrity Goals: Patient/caregiver will verbalize understanding of skin care regimen Date Initiated: 01/10/2015 Goal Status: Active Ulcer/skin breakdown will have a volume reduction of 30% by week 4 Date Initiated: 01/10/2015 Goal Status: Active Ulcer/skin breakdown will have a volume reduction of 50% by week 8 Date Initiated: 01/10/2015 Goal Status: Active Ulcer/skin breakdown will have a volume reduction of 80% by week 12 Date Initiated: 01/10/2015 Goal Status: Active Ulcer/skin breakdown will heal within 14 weeks Date Initiated: 01/10/2015 Goal Status: Active Interventions: Assess patient/caregiver ability to obtain necessary supplies Assess patient/caregiver ability to perform ulcer/skin care regimen upon admission and as needed Assess ulceration(s) every visit Provide education on smoking Provide education on ulcer and skin care Treatment Activities: Consult for HBO : 05/25/2015 Skin care regimen initiated : 05/25/2015 Topical wound management initiated : 05/25/2015 Notes: Electronic Signature(s) Signed: 05/25/2015 9:25:02 AM By: Regan Lemming BSN, RN Entered By: Regan Lemming on 05/25/2015 09:25:01 [name] ([id number]) -------------------------------------------------------------------------------- Pain Assessment Details Patient Name: [name] Date of Service: 05/25/2015 8:45 AM Medical Record Number: [id number] Patient Account  Number: [id number] Date of Birth/Sex: 1956/05/14 (59 y.o. Female) Treating RN: Baruch Gouty, RN, BSN, Velva Harman Primary Care Physician: [name] Other Clinician: Referring Physician: [name] Treating Physician/Extender: Frann Rider in Treatment: 31 Active Problems Location of Pain Severity and Description of Pain Patient Has Paino No Site Locations Pain Management and Medication Current Pain Management: Electronic Signature(s) Signed: 05/25/2015 9:01:46 AM By: Regan Lemming BSN, RN Entered By: Regan Lemming on 05/25/2015 09:01:46 [name] ([id number]) -------------------------------------------------------------------------------- Wound Assessment Details Patient Name: [name] Date of Service: 05/25/2015 8:45 AM Medical Record Number: [id number] Patient Account Number: [id number] Date of Birth/Sex: Sep 06, 1956 (59 y.o. Female) Treating RN: Afful, RN, BSN, [place] Primary Care Physician: [name] Other Clinician: Referring Physician: [name] Treating Physician/Extender: Frann Rider in Treatment: 19 Wound Status Wound Number: 4 Primary Pressure Ulcer Etiology: Wound Location: Coccyx - Medial Wound Healed - Epithelialized Wounding Event: Pressure Injury Status: Date Acquired: 02/21/2015 Comorbid Anemia, Arrhythmia, Congestive Heart Weeks Of Treatment: 11 History: Failure, Hypertension, Type II Diabetes Clustered Wound: No Wound Measurements Length: (cm) 0 % Reduction i Width: (cm) 0 % Reduction i Depth: (cm) 0 Epithelializa Area: (cm) 0 Tunneling: Volume: (cm) 0 Undermining: n Area: 100% n Volume: 100% tion: Large (67-100%) No No Wound Description Classification: Category/Stage III Wound Margin: Flat and Intact Exudate Amount: None Present Foul Odor After Cleansing: No Wound Bed Granulation Amount: None Present (0%) Exposed Structure Necrotic Amount: None Present (0%) Fascia Exposed: No Fat Layer Exposed: No Tendon  Exposed: No Muscle Exposed: No Joint Exposed: No Bone Exposed: No Limited to Skin Breakdown Periwound Skin Texture Texture Color No Abnormalities Noted:  No No Abnormalities Noted: No Callus: No Atrophie Blanche: No Crepitus: No Cyanosis: No Excoriation: No Ecchymosis: No Fluctuance: No Erythema: No Friable: No Hemosiderin Staining: No Induration: No Mottled: No [name], [name] ([id number]) Localized Edema: No Pallor: No Rash: No Rubor: No Scarring: No Temperature / Pain Moisture Temperature: No Abnormality No Abnormalities Noted: No Dry / Scaly: No Maceration: No Moist: No Wound Preparation Ulcer Cleansing: Rinsed/Irrigated with Saline Topical Anesthetic Applied: None Electronic Signature(s) Signed: 05/25/2015 9:11:32 AM By: Regan Lemming BSN, RN Entered By: Regan Lemming on 05/25/2015 09:11:32 [name] ([id number]) -------------------------------------------------------------------------------- Wound Assessment Details Patient Name: [name] Date of Service: 05/25/2015 8:45 AM Medical Record Number: [id number] Patient Account Number: [id number] Date of Birth/Sex: 11/04/1955 (59 y.o. Female) Treating RN: Afful, RN, BSN, [place] Primary Care Physician: [name] Other Clinician: Referring Physician: [name] Treating Physician/Extender: Frann Rider in Treatment: 19 Wound Status Wound Number: 7 Primary Diabetic Wound/Ulcer of the Lower Etiology: Extremity Wound Location: Right Toe Second Wound Open Wounding Event: Gradually Appeared Status: Date Acquired: 03/10/2015 Comorbid Anemia, Arrhythmia, Congestive Heart Weeks Of Treatment: 10 History: Failure, Hypertension, Type II Diabetes Clustered Wound: No Wound Measurements Length: (cm) 0.6 Width: (cm) 0.8 Depth: (cm) 0.1 Area: (cm) 0.377 Volume: (cm) 0.038 % Reduction in Area: 53.9% % Reduction in Volume: 53.7% Epithelialization: Small (1-33%) Tunneling: No Wound  Description Classification: Grade 1 Foul Odor Aft Wound Margin: Flat and Intact Exudate Amount: Small Exudate Type: Serous Exudate Color: amber er Cleansing: No Wound Bed Granulation Amount: Medium (34-66%) Exposed Structure Granulation Quality: Red, Pink Fascia Exposed: No Necrotic Amount: Medium (34-66%) Fat Layer Exposed: No Necrotic Quality: Adherent Slough Tendon Exposed: No Muscle Exposed: No Joint Exposed: No Bone Exposed: No Limited to Skin Breakdown Periwound Skin Texture Texture Color No Abnormalities Noted: No No Abnormalities Noted: No Callus: Yes Atrophie Blanche: No Crepitus: No Cyanosis: No Excoriation: No Ecchymosis: No Fluctuance: No Erythema: No [name], [name] ([id number]) Friable: No Hemosiderin Staining: No Induration: No Mottled: No Localized Edema: No Pallor: No Rash: No Rubor: No Scarring: No Temperature / Pain Moisture Temperature: No Abnormality No Abnormalities Noted: No Dry / Scaly: No Maceration: No Moist: Yes Wound Preparation Ulcer Cleansing: Rinsed/Irrigated with Saline Topical Anesthetic Applied: Other: lidocaine 4%, Electronic Signature(s) Signed: 05/25/2015 9:12:33 AM By: Regan Lemming BSN, RN Entered By: Regan Lemming on 05/25/2015 09:12:33 [name] ([id number]) -------------------------------------------------------------------------------- Vitals Details Patient Name: [name] Date of Service: 05/25/2015 8:45 AM Medical Record Number: [id number] Patient Account Number: [id number] Date of Birth/Sex: 1956/01/07 (59 y.o. Female) Treating RN: Afful, RN, BSN, [place] Primary Care Physician: [name] Other Clinician: Referring Physician: [name] Treating Physician/Extender: Frann Rider in Treatment: 19 Vital Signs Time Taken: 09:01 Temperature (F): 97.7 Height (in): 68 Pulse (bpm): 87 Weight (lbs): 257 Respiratory Rate (breaths/min): 18 Body Mass Index (BMI): 39.1 Blood Pressure  (mmHg): 110/68 Reference Range: 80 - 120 mg / dl Electronic Signature(s) Signed: 05/25/2015 9:02:11 AM By: Regan Lemming BSN, RN Entered By: Regan Lemming on 05/25/2015 09:02:11

## 2015-05-26 ENCOUNTER — Encounter: Payer: PPO | Admitting: Surgery

## 2015-05-26 ENCOUNTER — Telehealth: Payer: Self-pay | Admitting: Neurology

## 2015-05-26 DIAGNOSIS — L97512 Non-pressure chronic ulcer of other part of right foot with fat layer exposed: Secondary | ICD-10-CM | POA: Diagnosis not present

## 2015-05-26 LAB — GLUCOSE, CAPILLARY
GLUCOSE-CAPILLARY: 190 mg/dL — AB (ref 65–99)
Glucose-Capillary: 215 mg/dL — ABNORMAL HIGH (ref 65–99)

## 2015-05-26 NOTE — Telephone Encounter (Signed)
Is patient continuing to HOLD Carvedilol?

## 2015-05-26 NOTE — Progress Notes (Signed)
[name], [name] ([id number]) Visit Report for 05/25/2015 HBO Details Patient Name: [name], [name] Date of Service: 05/25/2015 10:00 AM Medical Record Number: [id number] Patient Account Number: [id number] Date of Birth/Sex: [birth year] (59 y.o. 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 HBO Treatment Details Treatment Number: 27 Patient Type: Outpatient Chamber Type: Monoplace Chamber #: HBO [id number] Treatment Protocol: 2.0 ATA with 90 minutes oxygen, and no air breaks Treatment Details Compression Rate Down: 1.5 psi / minute De-Compression Rate Up: 1.5 psi / minute Air breaks and breathing Compress Tx Pressure Decompress Decompress periods Begins Reached Begins Ends (leave unused spaces blank) Chamber Pressure 1 ATA 2.0 ATA - - - - - - 2.0 ATA 1 ATA Clock Time (24 hr) 09:44 09:54 - - - - - - 11:24 11:34 Treatment Length: 110 (minutes) Treatment Segments: 4 Capillary Blood Glucose Pre Capillary Blood Glucose (mg/dl): Post Capillary Blood Glucose (mg/dl): Vital Signs Capillary Blood Glucose Reference Range: 80 - 120 mg / dl HBO Diabetic Blood Glucose Intervention Range: <131 mg/dl or >249 mg/dl Time Vitals Blood Respiratory Capillary Blood Glucose Pulse Action Type: Pulse: Temperature: Taken: Pressure: Rate: Glucose (mg/dl): Meter #: Oximetry (%) Taken: Pre 09:00 110/68 87 18 97.7 208 1 none Post 11:38 152/90 78 18 97.7 183 1 none Treatment Response Well [name], [name] ([id number]) Treatment Toleration: Treatment Treatment Completed without Adverse Event Completion Status: HBO  Attestation I certify that I supervised this HBO treatment in accordance with Medicare guidelines. A trained Yes emergency response team is readily available per hospital policies and procedures. Continue HBOT as ordered. Yes Electronic Signature(s) Signed: 05/25/2015 1:14:34 PM By: Christin Fudge MD, FACS Previous Signature: 05/25/2015 1:14:21 PM Version By: Christin Fudge MD, FACS Entered By: Christin Fudge on 05/25/2015 13:14:34 [name] ([id number]) -------------------------------------------------------------------------------- HBO Safety Checklist Details Patient Name: [name] Date of Service: 05/25/2015 10:00 AM Medical Record Number: [id number] Patient Account Number: [id number] Date of Birth/Sex: 09/11/1956 (59 y.o. Female) Treating RN: Primary Care Physician: [name] Other Clinician: Jacqulyn Bath Referring Physician: [name] Treating Physician/Extender: Frann Rider in Treatment: 19 HBO Safety Checklist Items Safety Checklist Consent Form Signed Patient voided / foley secured and emptied When did you last eato 10:30 pm on 05/24/15 Last dose of injectable or oral agent 08:00 am Ostomy pouch emptied and vented if applicable n/a All implantable devices assessed, documented and approved ICD Intravenous access site secured and place n/a Valuables secured Linens and cotton and cotton/polyester blend (less than 51% polyester) Personal oil-based products / skin lotions / body lotions removed Wigs or hairpieces removed Smoking or tobacco materials removed Books / newspapers / magazines / loose paper removed Cologne, aftershave, perfume and deodorant removed Jewelry removed (may wrap wedding band) Make-up removed Hair care products removed Battery operated devices (external) removed Heating patches and chemical warmers removed Titanium eyewear removed n/a Nail polish cured greater than 10 hours n/a Casting material cured greater than  10 hours n/a Hearing aids removed n/a Loose dentures or partials removed n/a Prosthetics have been removed n/a Patient demonstrates correct use of air break device (if applicable) Patient concerns have been addressed Patient grounding bracelet on and cord attached to chamber Specifics for Inpatients (complete in addition to above) Medication sheet  sent with patient Intravenous medications needed or due during therapy sent with patient [name], [name] ([id number]) Drainage tubes (e.g. nasogastric tube or chest tube secured and vented) Endotracheal or Tracheotomy tube secured Cuff deflated of air and inflated with saline Airway suctioned Electronic Signature(s) Signed: 05/25/2015 3:59:38 PM By: Lorine Bears RCP, RRT, CHT Entered By: Lorine Bears on 05/25/2015 09:33:55

## 2015-05-26 NOTE — Telephone Encounter (Signed)
Please contact patient to make f/u appointment as I would like to see how she is doing.  [name] K. Posey Pronto, DO

## 2015-05-26 NOTE — Telephone Encounter (Signed)
Spoke to patient she is not taking the carvedilol.  Please advise next step.

## 2015-05-26 NOTE — Telephone Encounter (Signed)
Called and left message on machine for the patient to call back.

## 2015-05-26 NOTE — Progress Notes (Signed)
LAINEY, MCALLISTER ([id number]) Visit Report for 05/26/2015 Arrival Information Details Patient Name: [name], [name] Date of Service: 05/26/2015 10:00 AM Medical Record Number: [id number] Patient Account Number: [id number] Date of Birth/Sex: 1955-12-29 (59 y.o. Female) Treating RN: Primary Care Physician: [name] Other Clinician: Jacqulyn Bath Referring Physician: [name] Treating Physician/Extender: Frann Rider in Treatment: 49 Visit Information History Since Last Visit Added or deleted any medications: No Patient Arrived: Wheel Chair Any new allergies or adverse reactions: No Arrival Time: 09:08 Had a fall or experienced change in No Accompanied By: husband activities of daily living that may affect Transfer Assistance: Manual risk of falls: Patient Identification Verified: Yes Signs or symptoms of abuse/neglect No Secondary Verification Process Yes since last visito Completed: Hospitalized since last visit: No Patient Requires Transmission- No Has Dressing in Place as Prescribed: Yes Based Precautions: Has Footwear/Offloading in Place as Yes Patient Has Alerts: Yes Prescribed: Patient Alerts: Patient on Blood Left: Surgical Shoe with Thinner Pressure Relief ABI L:0.8, Insole R:0.93 Right: Surgical Shoe with Pressure Relief Insole Pain Present Now: No Electronic Signature(s) Signed: 05/26/2015 3:40:34 PM By: Lorine Bears RCP, RRT, CHT Entered By: Lorine Bears on 05/26/2015 09:15:42 [name] ([id number]) -------------------------------------------------------------------------------- Encounter Discharge Information Details Patient Name: [name] Date of Service: 05/26/2015 10:00 AM Medical Record Number: [id number] Patient Account Number: [id number] Date of Birth/Sex: 19-Mar-1956 (59 y.o. Female) Treating RN: Primary Care Physician: [name] Other Clinician: Jacqulyn Bath Referring  Physician: [name] Treating Physician/Extender: Frann Rider in Treatment: 76 Encounter Discharge Information Items Discharge Pain Level: 0 Discharge Condition: Stable Ambulatory Status: Wheelchair Discharge Destination: Home Private Transportation: Auto Accompanied By: husband Schedule Follow-up Appointment: No Medication Reconciliation completed and No provided to Patient/Care Akiel Fennell: Clinical Summary of Care: Notes Patient has an HBO treatment scheduled on 05/29/15 at 10:00 am. Electronic Signature(s) Signed: 05/26/2015 3:40:34 PM By: Lorine Bears RCP, RRT, CHT Entered By: Lorine Bears on 05/26/2015 12:41:47 [name] ([id number]) -------------------------------------------------------------------------------- Vitals Details Patient Name: [name] Date of Service: 05/26/2015 10:00 AM Medical Record Number: [id number] Patient Account Number: [id number] Date of Birth/Sex: 1955-12-01 (59 y.o. Female) Treating RN: Primary Care Physician: [name] Other Clinician: Jacqulyn Bath Referring Physician: [name] Treating Physician/Extender: Frann Rider in Treatment: 81 Vital Signs Time Taken: 09:09 Temperature (F): 97.6 Height (in): 68 Pulse (bpm): 72 Weight (lbs): 257 Respiratory Rate (breaths/min): 18 Body Mass Index (BMI): 39.1 Blood Pressure (mmHg): 108/62 Capillary Blood Glucose (mg/dl): 190 Reference Range: 80 - 120 mg / dl Electronic Signature(s) Signed: 05/26/2015 3:40:34 PM By: Lorine Bears RCP, RRT, CHT Entered By: Becky Sax, Amado Nash on 05/26/2015 09:16:07

## 2015-05-26 NOTE — Progress Notes (Signed)
RILY, MYERS ([id number]) Visit Report for 05/25/2015 Arrival Information Details Patient Name: [name], [name] Date of Service: 05/25/2015 10:00 AM Medical Record Number: [id number] Patient Account Number: [id number] Date of Birth/Sex: 07-Mar-1956 (59 y.o. Female) Treating RN: Primary Care Physician: [name] Other Clinician: Jacqulyn Bath Referring Physician: [name] Treating Physician/Extender: Frann Rider in Treatment: 18 Visit Information History Since Last Visit Added or deleted any medications: No Patient Arrived: Wheel Chair Any new allergies or adverse reactions: No Arrival Time: 09:26 Had a fall or experienced change in No Accompanied By: husband activities of daily living that may affect Transfer Assistance: Manual risk of falls: Patient Identification Verified: Yes Signs or symptoms of abuse/neglect No Secondary Verification Process Yes since last visito Completed: Hospitalized since last visit: No Patient Requires Transmission- No Has Dressing in Place as Prescribed: Yes Based Precautions: Has Footwear/Offloading in Place as Yes Patient Has Alerts: Yes Prescribed: Patient Alerts: Patient on Blood Left: Surgical Shoe with Thinner Pressure Relief ABI L:0.8, Insole R:0.93 Right: Surgical Shoe with Pressure Relief Insole Pain Present Now: No Electronic Signature(s) Signed: 05/25/2015 3:59:38 PM By: Lorine Bears RCP, RRT, CHT Entered By: Lorine Bears on 05/25/2015 09:28:45 [name] ([id number]) -------------------------------------------------------------------------------- Encounter Discharge Information Details Patient Name: [name] Date of Service: 05/25/2015 10:00 AM Medical Record Number: [id number] Patient Account Number: [id number] Date of Birth/Sex: 28-May-1956 (59 y.o. Female) Treating RN: Primary Care Physician: [name] Other Clinician: Jacqulyn Bath Referring  Physician: [name] Treating Physician/Extender: Frann Rider in Treatment: 61 Encounter Discharge Information Items Discharge Pain Level: 0 Discharge Condition: Stable Ambulatory Status: Wheelchair Discharge Destination: Home Private Transportation: Auto Accompanied By: husband Schedule Follow-up Appointment: No Medication Reconciliation completed and No provided to Patient/Care Jesse Nosbisch: Clinical Summary of Care: Notes Patient has an HBO treatment scheduled on 05/26/15 at 10:00 am. Electronic Signature(s) Signed: 05/25/2015 3:59:38 PM By: Lorine Bears RCP, RRT, CHT Entered By: Lorine Bears on 05/25/2015 12:35:54 [name] ([id number]) -------------------------------------------------------------------------------- Vitals Details Patient Name: [name] Date of Service: 05/25/2015 10:00 AM Medical Record Number: [id number] Patient Account Number: [id number] Date of Birth/Sex: 12-19-1955 (59 y.o. Female) Treating RN: Primary Care Physician: [name] Other Clinician: Jacqulyn Bath Referring Physician: [name] Treating Physician/Extender: Frann Rider in Treatment: 43 Vital Signs Time Taken: 09:00 Temperature (F): 97.7 Height (in): 68 Pulse (bpm): 87 Weight (lbs): 257 Respiratory Rate (breaths/min): 18 Body Mass Index (BMI): 39.1 Blood Pressure (mmHg): 110/68 Capillary Blood Glucose (mg/dl): 208 Reference Range: 80 - 120 mg / dl Electronic Signature(s) Signed: 05/25/2015 3:59:38 PM By: Lorine Bears RCP, RRT, CHT Entered By: Becky Sax, Amado Nash on 05/25/2015 09:29:44

## 2015-05-27 NOTE — Progress Notes (Signed)
TALEN, DERICO ([id number]) Visit Report for 05/26/2015 HBO Details Patient Name: [name], [name] Date of Service: 05/26/2015 10:00 AM Medical Record Number: [id number] Patient Account Number: [id number] Date of Birth/Sex: Apr 22, 1956 (59 y.o. 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 HBO Treatment Details Treatment Number: 28 Patient Type: Outpatient Chamber Type: Monoplace Chamber #: HBO [id number] Treatment Protocol: 2.0 ATA with 90 minutes oxygen, and no air breaks Treatment Details Compression Rate Down: 1.5 psi / minute De-Compression Rate Up: 1.5 psi / minute Air breaks and breathing Compress Tx Pressure Decompress Decompress periods Begins Reached Begins Ends (leave unused spaces blank) Chamber Pressure 1 ATA 2.0 ATA - - - - - - 2.0 ATA 1 ATA Clock Time (24 hr) 09:24 09:34 - - - - - - 11:04 11:14 Treatment Length: 110 (minutes) Treatment Segments: 4 Capillary Blood Glucose Pre Capillary Blood Glucose (mg/dl): Post Capillary Blood Glucose (mg/dl): Vital Signs Capillary Blood Glucose Reference Range: 80 - 120 mg / dl HBO Diabetic Blood Glucose Intervention Range: <131 mg/dl or >249 mg/dl Time Vitals Blood Respiratory Capillary Blood Glucose Pulse Action Type: Pulse: Temperature: Taken: Pressure: Rate: Glucose (mg/dl): Meter #: Oximetry (%) Taken: Pre 09:09 108/62 72 18 97.6 190 1 none Post 11:17 144/92 84 18 97.7 144 1 none Treatment Response Well [name], [name] ([id number]) Treatment Toleration: Treatment Treatment Completed without Adverse Event Completion Status: HBO  Attestation I certify that I supervised this HBO treatment in accordance with Medicare guidelines. A trained Yes emergency response team is readily available per hospital policies and procedures. Continue HBOT as ordered. Yes Electronic Signature(s) Signed: 05/26/2015 4:25:01 PM By: Christin Fudge MD, FACS Entered By: Christin Fudge on 05/26/2015 12:08:55 [name] ([id number]) -------------------------------------------------------------------------------- HBO Safety Checklist Details Patient Name: [name] Date of Service: 05/26/2015 10:00 AM Medical Record Number: [id number] Patient Account Number: [id number] Date of Birth/Sex: 1956-05-06 (59 y.o. Female) Treating RN: Primary Care Physician: [name] Other Clinician: Jacqulyn Bath Referring Physician: [name] Treating Physician/Extender: Frann Rider in Treatment: 19 HBO Safety Checklist Items Safety Checklist Consent Form Signed Patient voided / foley secured and emptied When did you last eato 22:30 pm on 05/25/15 Last dose of injectable or oral agent 08:00 am Ostomy pouch emptied and vented if applicable n/a All implantable devices assessed, documented and approved ICD Intravenous access site secured and place n/a Valuables secured Linens and cotton and cotton/polyester blend (less than 51% polyester) Personal oil-based products / skin lotions / body lotions removed Wigs or hairpieces removed Smoking or tobacco materials removed Books / newspapers / magazines / loose paper removed Cologne, aftershave, perfume and deodorant removed Jewelry removed (may wrap wedding band) Make-up removed Hair care products removed Battery operated devices (external) removed Heating patches and chemical warmers removed Titanium eyewear removed n/a Nail polish cured greater than 10 hours n/a Casting material cured greater than 10 hours n/a Hearing aids removed n/a Loose dentures or partials removed  n/a Prosthetics have been removed n/a Patient demonstrates correct use of air break device (if applicable) Patient concerns have been addressed Patient grounding bracelet on and cord attached to chamber Specifics for Inpatients (complete in addition to above) Medication sheet sent with patient Intravenous medications needed or due during therapy sent  with patient [name], [name] ([id number]) Drainage tubes (e.g. nasogastric tube or chest tube secured and vented) Endotracheal or Tracheotomy tube secured Cuff deflated of air and inflated with saline Airway suctioned Electronic Signature(s) Signed: 05/26/2015 3:40:34 PM By: Lorine Bears RCP, RRT, CHT Entered By: Lorine Bears on 05/26/2015 09:18:03

## 2015-05-29 ENCOUNTER — Telehealth: Payer: Self-pay

## 2015-05-29 ENCOUNTER — Telehealth: Payer: Self-pay | Admitting: Neurology

## 2015-05-29 ENCOUNTER — Encounter: Payer: PPO | Admitting: Surgery

## 2015-05-29 DIAGNOSIS — L97512 Non-pressure chronic ulcer of other part of right foot with fat layer exposed: Secondary | ICD-10-CM | POA: Diagnosis not present

## 2015-05-29 LAB — GLUCOSE, CAPILLARY
GLUCOSE-CAPILLARY: 214 mg/dL — AB (ref 65–99)
Glucose-Capillary: 261 mg/dL — ABNORMAL HIGH (ref 65–99)

## 2015-05-29 NOTE — Telephone Encounter (Signed)
[name] at 05/29/2015 4:11 PM     Status: Signed       Expand All Collapse All   Patient is returning a missed phone call.

## 2015-05-29 NOTE — Telephone Encounter (Signed)
Left VM to call back 

## 2015-05-29 NOTE — Progress Notes (Signed)
[name], [name] ([id number]) Visit Report for 05/29/2015 HBO Details Patient Name: [name], [name] Date of Service: 05/29/2015 10:00 AM Medical Record Number: [id number] Patient Account Number: [id number] Date of Birth/Sex: [birth year] (59 y.o. 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 HBO Treatment Details Treatment Number: 29 Patient Type: Outpatient Chamber Type: Monoplace Chamber #: HBO [id number] Treatment Protocol: 2.0 ATA with 90 minutes oxygen, and no air breaks Treatment Details Compression Rate Down: 1.5 psi / minute De-Compression Rate Up: 1.5 psi / minute Air breaks and breathing Compress Tx Pressure Decompress Decompress periods Begins Reached Begins Ends (leave unused spaces blank) Chamber Pressure 1 ATA 2.0 ATA - - - - - - 2.0 ATA 1 ATA Clock Time (24 hr) 09:23 09:33 - - - - - - 11:03 11:13 Treatment Length: 110 (minutes) Treatment Segments: 4 Capillary Blood Glucose Pre Capillary Blood Glucose (mg/dl): Post Capillary Blood Glucose (mg/dl): Vital Signs Capillary Blood Glucose Reference Range: 80 - 120 mg / dl HBO Diabetic Blood Glucose Intervention Range: <131 mg/dl or >249 mg/dl Time Vitals Blood Respiratory Capillary Blood Glucose Pulse Action Type: Pulse: Temperature: Taken: Pressure: Rate: Glucose (mg/dl): Meter #: Oximetry (%) Taken: Pre 09:04 100/60 84 18 97.7 261 1 none Post 11:17 128/70 72 18 97.6 214 1 none Treatment Response Well [name], [name] ([id number]) Treatment Toleration: Treatment Treatment Completed without Adverse Event Completion  Status: Electronic Signature(s) Signed: 05/29/2015 4:21:35 PM By: Paulla Fore, RRT, CHT Signed: 05/29/2015 4:35:29 PM By: Christin Fudge MD, FACS Previous Signature: 05/29/2015 11:45:47 AM Version By: Christin Fudge MD, FACS Entered By: Lorine Bears on 05/29/2015 12:02:40 [name] ([id number]) -------------------------------------------------------------------------------- HBO Safety Checklist Details Patient Name: [name] Date of Service: 05/29/2015 10:00 AM Medical Record Number: [id number] Patient Account Number: [id number] Date of Birth/Sex: 01/23/1956 (59 y.o. Female) Treating RN: Primary Care Physician: [name] Other Clinician: Jacqulyn Bath Referring Physician: [name] Treating Physician/Extender: Frann Rider in Treatment: 19 HBO Safety Checklist Items Safety Checklist Consent Form Signed Patient voided / foley secured and emptied When did you last eato 19:00 pm on 05/28/15 Last dose of injectable or oral agent 08:00 am NA Ostomy pouch emptied and vented if applicable All implantable devices assessed, documented and approved ICD NA Intravenous access site secured and place Valuables secured Linens and cotton and cotton/polyester blend (less than 51% polyester) Personal oil-based products / skin lotions / body lotions removed Wigs or hairpieces removed Smoking or tobacco materials removed Books / newspapers / magazines / loose paper removed Cologne, aftershave, perfume and deodorant removed Jewelry removed (may wrap wedding band) Make-up removed Hair care products removed Battery operated devices (external) removed Heating patches and chemical warmers removed NA Titanium eyewear removed NA Nail polish cured greater than 10 hours NA Casting material cured greater than 10 hours NA Hearing aids removed NA Loose dentures or partials removed NA Prosthetics have been removed Patient demonstrates  correct use of air break device (if applicable) Patient concerns have been addressed Patient grounding bracelet on and cord attached to chamber Specifics for Inpatients (complete in addition to above) Medication sheet sent with patient Intravenous medications needed or due during therapy sent with patient [name], [name] ([id number]) Drainage tubes (e.g. nasogastric tube  or chest tube secured and vented) Endotracheal or Tracheotomy tube secured Cuff deflated of air and inflated with saline Airway suctioned Electronic Signature(s) Signed: 05/29/2015 4:21:35 PM By: Lorine Bears RCP, RRT, CHT Entered By: Lorine Bears on 05/29/2015 09:44:15

## 2015-05-29 NOTE — Telephone Encounter (Signed)
Please call patient and see how she is doing - has she seen ENT, if so - did they want to do anything?  Any updates from rheum?  I'd like to see her in clinic again for an update and to see what the next step will be.  Hemi Chacko K. Posey Pronto, DO

## 2015-05-29 NOTE — Progress Notes (Signed)
[name] ([id number]) Visit Report for 05/29/2015 Arrival Information Details Patient Name: [name] Date of Service: 05/29/2015 10:00 AM Medical Record Number: [id number] Patient Account Number: [id number] Date of Birth/Sex: May 21, 1956 (59 y.o. Female) Treating RN: Primary Care Physician: [name] Other Clinician: Jacqulyn Bath Referring Physician: [name] Treating Physician/Extender: Frann Rider in Treatment: 48 Visit Information History Since Last Visit Added or deleted any medications: No Patient Arrived: Wheel Chair Any new allergies or adverse No Arrival Time: 09:00 reactions: Accompanied By: husband Had a fall or experienced change in No Transfer Assistance: Manual activities of daily living that may affect Patient Identification Verified: Yes risk of falls: Secondary Verification Process Yes Hospitalized since last visit: No Completed: Has Dressing in Place as Prescribed: Yes Patient Requires Transmission- No Has Footwear/Offloading in Place as Yes Based Precautions: Prescribed: Patient Has Alerts: Yes Left: Surgical Shoe with Patient Alerts: Patient on Blood Pressure Relief Insole Thinner Right: Surgical Shoe with ABI L:0.8, Pressure Relief Insole R:0.93 Pain Present Now: No Electronic Signature(s) Signed: 05/29/2015 4:21:35 PM By: Lorine Bears RCP, RRT, CHT Entered By: Lorine Bears on 05/29/2015 09:13:51 [name] ([id number]) -------------------------------------------------------------------------------- Encounter Discharge Information Details Patient Name: [name] Date of Service: 05/29/2015 10:00 AM Medical Record Number: [id number] Patient Account Number: [id number] Date of Birth/Sex: 1955-10-25 (59 y.o. Female) Treating RN: Primary Care Physician: [name] Other Clinician: Jacqulyn Bath Referring Physician: [name] Treating Physician/Extender:  Frann Rider in Treatment: 82 Encounter Discharge Information Items Discharge Pain Level: 0 Discharge Condition: Stable Ambulatory Status: Wheelchair Discharge Destination: Home Private Transportation: Auto Accompanied By: husband Schedule Follow-up Appointment: No Medication Reconciliation completed and No provided to Patient/Care Sugar Vanzandt: Clinical Summary of Care: Notes Patient has an HBO treatment scheduled on 05/30/15 at 10:00 am. Electronic Signature(s) Signed: 05/29/2015 4:21:35 PM By: Lorine Bears RCP, RRT, CHT Entered By: Lorine Bears on 05/29/2015 12:03:26 [name] ([id number]) -------------------------------------------------------------------------------- Vitals Details Patient Name: [name] Date of Service: 05/29/2015 10:00 AM Medical Record Number: [id number] Patient Account Number: [id number] Date of Birth/Sex: 16-Aug-1956 (59 y.o. Female) Treating RN: Primary Care Physician: [name] Other Clinician: Jacqulyn Bath Referring Physician: [name] Treating Physician/Extender: Frann Rider in Treatment: 33 Vital Signs Time Taken: 09:04 Temperature (F): 97.7 Height (in): 68 Pulse (bpm): 84 Weight (lbs): 257 Respiratory Rate (breaths/min): 18 Body Mass Index (BMI): 39.1 Blood Pressure (mmHg): 100/60 Capillary Blood Glucose (mg/dl): 261 Reference Range: 80 - 120 mg / dl Electronic Signature(s) Signed: 05/29/2015 4:21:35 PM By: Lorine Bears RCP, RRT, CHT Entered By: Lorine Bears on 05/29/2015 09:39:48

## 2015-05-29 NOTE — Telephone Encounter (Signed)
Patient is returning a missed phone call.

## 2015-05-29 NOTE — Telephone Encounter (Signed)
Noted. No further action at this time. Pt should continue to HOLD Carvedilol for now.

## 2015-05-30 ENCOUNTER — Encounter: Payer: PPO | Admitting: Surgery

## 2015-05-30 DIAGNOSIS — L97512 Non-pressure chronic ulcer of other part of right foot with fat layer exposed: Secondary | ICD-10-CM | POA: Diagnosis not present

## 2015-05-30 LAB — GLUCOSE, CAPILLARY
Glucose-Capillary: 208 mg/dL — ABNORMAL HIGH (ref 65–99)
Glucose-Capillary: 211 mg/dL — ABNORMAL HIGH (ref 65–99)

## 2015-05-30 NOTE — Progress Notes (Signed)
HANNAHROSE, SELLNER ([id number]) Visit Report for 05/30/2015 HBO Details Patient Name: [name], [name] Date of Service: 05/30/2015 10:00 AM Medical Record Number: [id number] Patient Account Number: [id number] Date of Birth/Sex: 08-31-1956 (59 y.o. 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 HBO Treatment Details Treatment Number: 30 Patient Type: Outpatient Chamber Type: Monoplace Chamber #: HBO [id number] Treatment Protocol: 2.0 ATA with 90 minutes oxygen, and no air breaks Treatment Details Compression Rate Down: 1.5 psi / minute De-Compression Rate Up: 1.5 psi / minute Air breaks and breathing Compress Tx Pressure Decompress Decompress periods Begins Reached Begins Ends (leave unused spaces blank) Chamber Pressure 1 ATA 2.0 ATA - - - - - - 2.0 ATA 1 ATA Clock Time (24 hr) 09:16 09:27 - - - - - - 10:57 11:07 Treatment Length: 111 (minutes) Treatment Segments: 4 Capillary Blood Glucose Pre Capillary Blood Glucose (mg/dl): Post Capillary Blood Glucose (mg/dl): Vital Signs Capillary Blood Glucose Reference Range: 80 - 120 mg / dl HBO Diabetic Blood Glucose Intervention Range: <131 mg/dl or >249 mg/dl Time Vitals Blood Respiratory Capillary Blood Glucose Pulse Action Type: Pulse: Temperature: Taken: Pressure: Rate: Glucose (mg/dl): Meter #: Oximetry (%) Taken: Pre 09:02 102/60 84 18 97.6 208 1 none Post 01:10 142/84 78 18 97.6 211 1 none Treatment Response Well TYRONZA, KITTELL ([id number]) Treatment Toleration: Treatment Treatment Completed without Adverse Event Completion Status: HBO  Attestation I certify that I supervised this HBO treatment in accordance with Medicare guidelines. A trained Yes emergency response team is readily available per hospital policies and procedures. Continue HBOT as ordered. Yes Electronic Signature(s) Signed: 05/30/2015 11:25:43 AM By: Christin Fudge MD, FACS Entered By: Christin Fudge on 05/30/2015 11:25:43 [name] ([id number]) -------------------------------------------------------------------------------- HBO Safety Checklist Details Patient Name: [name] Date of Service: 05/30/2015 10:00 AM Medical Record Number: [id number] Patient Account Number: [id number] Date of Birth/Sex: 1955-12-02 (59 y.o. Female) Treating RN: Primary Care Physician: [name] Other Clinician: Jacqulyn Bath Referring Physician: [name] Treating Physician/Extender: Frann Rider in Treatment: 20 HBO Safety Checklist Items Safety Checklist Consent Form Signed Patient voided / foley secured and emptied When did you last eato 22:30 pm on 05/29/15 Last dose of injectable or oral agent 08:00 am NA Ostomy pouch emptied and vented if applicable All implantable devices assessed, documented and approved ICD NA Intravenous access site secured and place Valuables secured Linens and cotton and cotton/polyester blend (less than 51% polyester) Personal oil-based products / skin lotions / body lotions removed Wigs or hairpieces removed Smoking or tobacco materials removed Books / newspapers / magazines / loose paper removed Cologne, aftershave, perfume and deodorant removed Jewelry removed (may wrap wedding band) Make-up removed Hair care products removed Battery operated devices (external) removed Heating patches and chemical warmers removed NA Titanium eyewear removed NA Nail polish cured greater than 10 hours NA Casting material cured greater than 10 hours NA Hearing aids removed NA Loose dentures or partials removed NA  Prosthetics have been removed Patient demonstrates correct use of air break device (if applicable) Patient concerns have been addressed Patient grounding bracelet on and cord attached to chamber Specifics for Inpatients (complete in addition to above) Medication sheet sent with patient Intravenous medications needed or due during therapy sent  with patient OWYN, WILBUR ([id number]) Drainage tubes (e.g. nasogastric tube or chest tube secured and vented) Endotracheal or Tracheotomy tube secured Cuff deflated of air and inflated with saline Airway suctioned Electronic Signature(s) Signed: 05/30/2015 11:45:44 AM By: Lorine Bears RCP, RRT, CHT Entered By: Lorine Bears on 05/30/2015 09:24:13

## 2015-05-30 NOTE — Progress Notes (Signed)
[name], [name] ([id number]) Visit Report for 05/30/2015 Arrival Information Details Patient Name: [name], [name] Date of Service: 05/30/2015 10:00 AM Medical Record Number: [id number] Patient Account Number: [id number] Date of Birth/Sex: Oct 25, 1955 (59 y.o. Female) Treating RN: Primary Care Physician: [name] Other Clinician: Jacqulyn Bath Referring Physician: [name] Treating Physician/Extender: Frann Rider in Treatment: 62 Visit Information History Since Last Visit Added or deleted any medications: No Patient Arrived: Wheel Chair Any new allergies or adverse reactions: No Arrival Time: 09:00 Had a fall or experienced change in No Accompanied By: husband activities of daily living that may affect Transfer Assistance: Manual risk of falls: Patient Identification Verified: Yes Signs or symptoms of abuse/neglect No Secondary Verification Process Yes since last visito Completed: Hospitalized since last visit: No Patient Requires Transmission- No Has Dressing in Place as Prescribed: Yes Based Precautions: Has Footwear/Offloading in Place as Yes Patient Has Alerts: Yes Prescribed: Patient Alerts: Patient on Blood Left: Surgical Shoe with Thinner Pressure Relief ABI L:0.8, Insole R:0.93 Right: Surgical Shoe with Pressure Relief Insole Pain Present Now: No Electronic Signature(s) Signed: 05/30/2015 11:45:44 AM By: Lorine Bears RCP, RRT, CHT Entered By: Lorine Bears on 05/30/2015 09:21:45 [name] ([id number]) -------------------------------------------------------------------------------- Encounter Discharge Information Details Patient Name: [name] Date of Service: 05/30/2015 10:00 AM Medical Record Number: [id number] Patient Account Number: [id number] Date of Birth/Sex: 06-28-1956 (59 y.o. Female) Treating RN: Primary Care Physician: [name] Other Clinician: Jacqulyn Bath Referring  Physician: [name] Treating Physician/Extender: Frann Rider in Treatment: 20 Encounter Discharge Information Items Discharge Pain Level: 0 Discharge Condition: Stable Ambulatory Status: Wheelchair Discharge Destination: Home Private Transportation: Auto Accompanied By: husband Schedule Follow-up Appointment: No Medication Reconciliation completed and No provided to Patient/Care Esra Frankowski: Clinical Summary of Care: Notes Patient has an HBO treatment scheduled on 05/31/15 at 10:00 am. Electronic Signature(s) Signed: 05/30/2015 11:46:16 AM By: Lorine Bears RCP, RRT, CHT Entered By: Lorine Bears on 05/30/2015 11:46:00 [name] ([id number]) -------------------------------------------------------------------------------- Vitals Details Patient Name: [name] Date of Service: 05/30/2015 10:00 AM Medical Record Number: [id number] Patient Account Number: [id number] Date of Birth/Sex: [birth year] (59 y.o. Female) Treating RN: Primary Care Physician: [name] Other Clinician: Jacqulyn Bath Referring Physician: [name] Treating Physician/Extender: Frann Rider in Treatment: 20 Vital Signs Time Taken: 09:02 Temperature (F): 97.6 Height (in): 68 Pulse (bpm): 84 Weight (lbs): 257 Respiratory Rate (breaths/min): 18 Body Mass Index (BMI): 39.1 Blood Pressure (mmHg): 102/60 Capillary Blood Glucose (mg/dl): 208 Reference Range: 80 - 120 mg / dl Electronic Signature(s) Signed: 05/30/2015 11:45:44 AM By: Lorine Bears RCP, RRT, CHT Entered By: Lorine Bears on 05/30/2015 09:22:36

## 2015-05-30 NOTE — Telephone Encounter (Signed)
Called patient and left message for her to call me back.

## 2015-05-30 NOTE — Telephone Encounter (Signed)
Spoke to pt and advised to continue to hold Carvedilol.

## 2015-05-31 ENCOUNTER — Encounter: Payer: PPO | Admitting: Surgery

## 2015-05-31 ENCOUNTER — Telehealth: Payer: Self-pay | Admitting: Neurology

## 2015-05-31 DIAGNOSIS — L97512 Non-pressure chronic ulcer of other part of right foot with fat layer exposed: Secondary | ICD-10-CM | POA: Diagnosis not present

## 2015-05-31 LAB — GLUCOSE, CAPILLARY
GLUCOSE-CAPILLARY: 195 mg/dL — AB (ref 65–99)
Glucose-Capillary: 267 mg/dL — ABNORMAL HIGH (ref 65–99)

## 2015-05-31 NOTE — Progress Notes (Signed)
[name], [name] ([id number]) Visit Report for 05/31/2015 HBO Details Patient Name: [name], [name] Date of Service: 05/31/2015 10:00 AM Medical Record Number: [id number] Patient Account Number: [id number] Date of Birth/Sex: 06-28-1956 (59 y.o. 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 HBO Treatment Details Treatment Number: 31 Patient Type: Outpatient Chamber Type: Monoplace Chamber #: HBO [id number] Treatment Protocol: 2.0 ATA with 90 minutes oxygen, and no air breaks Treatment Details Compression Rate Down: 1.5 psi / minute De-Compression Rate Up: 1.5 psi / minute Air breaks and breathing Compress Tx Pressure Decompress Decompress periods Begins Reached Begins Ends (leave unused spaces blank) Chamber Pressure 1 ATA 2.0 ATA - - - - - - 2.0 ATA 1 ATA Clock Time (24 hr) 09:33 10:43 - - - - - - 11:13 11:24 Treatment Length: 111 (minutes) Treatment Segments: 4 Capillary Blood Glucose Pre Capillary Blood Glucose (mg/dl): Post Capillary Blood Glucose (mg/dl): Vital Signs Capillary Blood Glucose Reference Range: 80 - 120 mg / dl HBO Diabetic Blood Glucose Intervention Range: <131 mg/dl or >249 mg/dl Time Vitals Blood Respiratory Capillary Blood Glucose Pulse Action Type: Pulse: Temperature: Taken: Pressure: Rate: Glucose (mg/dl): Meter #: Oximetry (%) Taken: Pre 09:06 88/53 86 18 97.8 267 1 recheck BP per MD Pre 09:23 94/62 Post 11:27 122/82 78 18 97.7 195 1 none Treatment Response [name], [name] ([id number]) Treatment Well Toleration: Treatment Treatment Completed without Adverse  Event Completion Status: HBO Attestation I certify that I supervised this HBO treatment in accordance with Medicare guidelines. A trained Yes emergency response team is readily available per hospital policies and procedures. Continue HBOT as ordered. Yes Electronic Signature(s) Signed: 05/31/2015 3:32:14 PM By: Loletha Grayer MD Entered By: Loletha Grayer on 05/31/2015 13:20:38 [name] ([id number]) -------------------------------------------------------------------------------- HBO Safety Checklist Details Patient Name: [name] Date of Service: 05/31/2015 10:00 AM Medical Record Number: [id number] Patient Account Number: [id number] Date of Birth/Sex: Mar 27, 1956 (59 y.o. Female) Treating RN: Primary Care Physician: [name] Other Clinician: Jacqulyn Bath Referring Physician: [name] Treating Physician/Extender: BURNS III, Charlean Sanfilippo in Treatment: 20 HBO Safety Checklist Items Safety Checklist Consent Form Signed Patient voided / foley secured and emptied When did you last eato 22:30 pm on 05/30/15 Last dose of injectable or oral agent 08:00 am NA Ostomy pouch emptied and vented if applicable All implantable devices assessed, documented and approved ICD NA Intravenous access site secured and place Valuables secured Linens and cotton and cotton/polyester blend (less than 51% polyester) Personal oil-based products / skin lotions / body lotions removed Wigs or hairpieces removed Smoking or tobacco materials removed Books / newspapers / magazines / loose paper removed Cologne, aftershave, perfume and deodorant removed Jewelry removed (may wrap wedding band) Make-up removed Hair care products removed Battery operated devices (external) removed Heating patches and chemical warmers removed NA Titanium eyewear removed NA Nail polish cured greater than 10 hours NA Casting material cured greater than 10 hours NA Hearing aids removed NA  Loose dentures or partials removed NA Prosthetics have been removed Patient demonstrates correct use of air break device (if applicable) Patient concerns have been addressed Patient grounding bracelet on and cord attached to chamber Specifics for Inpatients (complete in addition to above) Medication sheet sent with  patient Intravenous medications needed or due during therapy sent with patient [name], [name] ([id number]) Drainage tubes (e.g. nasogastric tube or chest tube secured and vented) Endotracheal or Tracheotomy tube secured Cuff deflated of air and inflated with saline Airway suctioned Electronic Signature(s) Signed: 05/31/2015 3:14:05 PM By: Lorine Bears RCP, RRT, CHT Entered By: Lorine Bears on 05/31/2015 09:25:59

## 2015-05-31 NOTE — Telephone Encounter (Signed)
Pt returned call/ call back @ [phone]

## 2015-05-31 NOTE — Progress Notes (Signed)
[name], [name] ([id number]) Visit Report for 05/31/2015 Arrival Information Details Patient Name: [name], [name] Date of Service: 05/31/2015 10:00 AM Medical Record Number: [id number] Patient Account Number: [id number] Date of Birth/Sex: 12/09/1955 (59 y.o. Female) Treating RN: Primary Care Physician: [name] Other Clinician: Jacqulyn Bath Referring Physician: [name] Treating Physician/Extender: BURNS III, Charlean Sanfilippo in Treatment: 20 Visit Information History Since Last Visit Added or deleted any medications: No Patient Arrived: Wheel Chair Any new allergies or adverse reactions: No Arrival Time: 09:05 Had a fall or experienced change in No Accompanied By: husband activities of daily living that may affect Transfer Assistance: Manual risk of falls: Patient Requires Transmission- No Signs or symptoms of abuse/neglect No Based Precautions: since last visito Patient Has Alerts: Yes Hospitalized since last visit: No Patient Alerts: Patient on Blood Has Dressing in Place as Prescribed: Yes Thinner Has Footwear/Offloading in Place as Yes ABI L:0.8, Prescribed: R:0.93 Left: Surgical Shoe with Pressure Relief Insole Right: Surgical Shoe with Pressure Relief Insole Pain Present Now: No Electronic Signature(s) Signed: 05/31/2015 3:14:05 PM By: Lorine Bears RCP, RRT, CHT Entered By: Lorine Bears on 05/31/2015 09:18:35 [name] ([id number]) -------------------------------------------------------------------------------- Encounter Discharge Information Details Patient Name: [name] Date of Service: 05/31/2015 10:00 AM Medical Record Number: [id number] Patient Account Number: [id number] Date of Birth/Sex: [birth year] (59 y.o. Female) Treating RN: Primary Care Physician: [name] Other Clinician: Jacqulyn Bath Referring Physician: [name] Treating Physician/Extender: BURNS III, Charlean Sanfilippo in  Treatment: 20 Encounter Discharge Information Items Discharge Pain Level: 0 Discharge Condition: Stable Ambulatory Status: Wheelchair Discharge Destination: Home Private Transportation: Auto Accompanied By: husband Schedule Follow-up Appointment: No Medication Reconciliation completed and No provided to Patient/Care Kaizley Aja: Clinical Summary of Care: Notes Patient has an HBO treatment scheduled on 05/31/15 at 10:00 am. Electronic Signature(s) Signed: 05/31/2015 3:14:05 PM By: Lorine Bears RCP, RRT, CHT Entered By: Lorine Bears on 05/31/2015 11:47:48 [name] ([id number]) -------------------------------------------------------------------------------- Vitals Details Patient Name: [name] Date of Service: 05/31/2015 10:00 AM Medical Record Number: [id number] Patient Account Number: [id number] Date of Birth/Sex: June 17, 1956 (59 y.o. Female) Treating RN: Primary Care Physician: [name] Other Clinician: Jacqulyn Bath Referring Physician: [name] Treating Physician/Extender: BURNS III, Charlean Sanfilippo in Treatment: 20 Vital Signs Time Taken: 09:06 Temperature (F): 97.8 Height (in): 68 Pulse (bpm): 86 Weight (lbs): 257 Respiratory Rate (breaths/min): 18 Body Mass Index (BMI): 39.1 Blood Pressure (mmHg): 88/53 Capillary Blood Glucose (mg/dl): 267 Reference Range: 80 - 120 mg / dl Electronic Signature(s) Signed: 05/31/2015 3:14:05 PM By: Lorine Bears RCP, RRT, CHT Entered By: Lorine Bears on 05/31/2015 09:23:37

## 2015-06-01 ENCOUNTER — Encounter: Payer: PPO | Admitting: Surgery

## 2015-06-01 ENCOUNTER — Telehealth: Payer: Self-pay | Admitting: Family Medicine

## 2015-06-01 DIAGNOSIS — L97512 Non-pressure chronic ulcer of other part of right foot with fat layer exposed: Secondary | ICD-10-CM | POA: Diagnosis not present

## 2015-06-01 LAB — GLUCOSE, CAPILLARY
Glucose-Capillary: 248 mg/dL — ABNORMAL HIGH (ref 65–99)
Glucose-Capillary: 212 mg/dL — ABNORMAL HIGH (ref 65–99)

## 2015-06-01 NOTE — Telephone Encounter (Signed)
I spoke with patient and she said that ENT did not find anything and rheum did not do anything.  She has already scheduled her f/u appt.

## 2015-06-01 NOTE — Progress Notes (Signed)
[name], [name] ([id number]) Visit Report for 06/01/2015 HBO Details Patient Name: [name], [name] Date of Service: 06/01/2015 10:00 AM Medical Record Number: [id number] Patient Account Number: [id number] Date of Birth/Sex: 25-May-1956 (59 y.o. 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 HBO Treatment Details Treatment Number: 32 Patient Type: Outpatient Chamber Type: Monoplace Chamber #: HBO [id number] Treatment Protocol: 2.0 ATA with 90 minutes oxygen, and no air breaks Treatment Details Compression Rate Down: 1.5 psi / minute De-Compression Rate Up: 1.5 psi / minute Air breaks and breathing Compress Tx Pressure Decompress Decompress periods Begins Reached Begins Ends (leave unused spaces blank) Chamber Pressure 1 ATA 2.0 ATA - - - - - - 2.0 ATA 1 ATA Clock Time (24 hr) 09:28 09:38 - - - - - - 11:08 11:18 Treatment Length: 110 (minutes) Treatment Segments: 4 Capillary Blood Glucose Pre Capillary Blood Glucose (mg/dl): Post Capillary Blood Glucose (mg/dl): Vital Signs Capillary Blood Glucose Reference Range: 80 - 120 mg / dl HBO Diabetic Blood Glucose Intervention Range: <131 mg/dl or >249 mg/dl Time Vitals Blood Respiratory Capillary Blood Glucose Pulse Action Type: Pulse: Temperature: Taken: Pressure: Rate: Glucose (mg/dl): Meter #: Oximetry (%) Taken: Pre 08:54 119/69 89 18 97.5 248 1 none Post 11:23 128/70 84 18 97.6 212 Treatment Response Well [name], [name] ([id number]) Treatment Toleration: Treatment Treatment Completed without Adverse Event Completion Status: Electronic  Signature(s) Signed: 06/01/2015 3:30:24 PM By: Paulla Fore, RRT, CHT Signed: 06/01/2015 4:07:43 PM By: Christin Fudge MD, FACS Entered By: Lorine Bears on 06/01/2015 11:53:08 [name] ([id number]) -------------------------------------------------------------------------------- HBO Safety Checklist Details Patient Name: [name] Date of Service: 06/01/2015 10:00 AM Medical Record Number: [id number] Patient Account Number: [id number] Date of Birth/Sex: 01-11-1956 (59 y.o. Female) Treating RN: Primary Care Physician: [name] Other Clinician: Jacqulyn Bath Referring Physician: [name] Treating Physician/Extender: Frann Rider in Treatment: 20 HBO Safety Checklist Items Safety Checklist Consent Form Signed Patient voided / foley secured and emptied When did you last eato 10:30 pm on 05/31/15 Last dose of injectable or oral agent 08:00 am NA Ostomy pouch emptied and vented if applicable All implantable devices assessed, documented and approved ICD NA Intravenous access site secured and place Valuables secured Linens and cotton and cotton/polyester blend (less than 51% polyester) Personal oil-based products / skin lotions / body lotions removed Wigs or hairpieces removed Smoking or tobacco materials removed Books / newspapers / magazines / loose paper removed Cologne, aftershave, perfume and deodorant removed Jewelry removed (may wrap wedding band) Make-up removed Hair care products removed Battery operated devices (external) removed Heating patches and chemical warmers removed NA Titanium eyewear removed NA Nail polish cured greater than 10 hours NA Casting material cured greater than 10 hours NA Hearing aids removed NA Loose dentures or partials removed NA Prosthetics have been removed Patient demonstrates correct use of air break device (if applicable) Patient concerns have been addressed Patient  grounding bracelet on and cord attached to chamber Specifics for Inpatients (complete in addition to above) Medication sheet sent with patient Intravenous medications needed or due during therapy sent with patient [name], [name] ([id number]) Drainage tubes (e.g. nasogastric tube or chest tube secured and vented) Endotracheal or Tracheotomy tube secured Cuff deflated  of air and inflated with saline Airway suctioned Electronic Signature(s) Signed: 06/01/2015 3:30:24 PM By: Lorine Bears RCP, RRT, CHT Entered By: Lorine Bears on 06/01/2015 09:32:35

## 2015-06-01 NOTE — Telephone Encounter (Signed)
Normajean Baxter, [facility] nurse, wanted to let Dr. Tamala Julian know patient's BUN/Creat have been going up the past 2 weeks. Order to check q wk. 05/24/15 BUN 25 Creat 1.31, 05/29/15 BUN 35 Creat 1.45. Alyse Low said you can call her if needed cell# [phone]

## 2015-06-01 NOTE — Telephone Encounter (Signed)
Spoke with nurse.  She reports [name] is doing well over all and her Lasix dose has been increased to 20-40 due to recent swelling.  Since the increase in Lasix the swelling has resolved. She denies SOB, chest pain, diaphoresis.   Advised that she cut the Lasix dose in half and report the next bun and creatinine back to use.  Advised that if she has any shortness of breath, orthopnea, diaphoresis, or chest pain to come directly to the office or go to the nearest ED.

## 2015-06-01 NOTE — Telephone Encounter (Signed)
Pt made appt to see Dr patel on 06-28-15

## 2015-06-01 NOTE — Progress Notes (Signed)
osteomyelitis completed as ordered Date Initiated: 01/17/2015 Goal Status: Active Patient/caregiver will verbalize understanding of disease process and disease management Date Initiated: 01/17/2015 Goal Status: Active Signs and symptoms for osteomyelitis will be recognized and promptly  addressed LULLA, PARTSCH ([id number]) Date Initiated: 01/17/2015 Goal Status: Active Interventions: Assess for signs and symptoms of osteomyelitis resolution every visit Provide education on osteomyelitis Treatment Activities: MRI : 06/01/2015 Test ordered outside of clinic : 06/01/2015 X-ray : 06/01/2015 Notes: Soft Tissue Infection Nursing Diagnoses: Knowledge deficit related to disease process and management Potential for infection: soft tissue Goals: Patient/caregiver will verbalize understanding of or measures to prevent infection and contamination in the home setting Date Initiated: 01/17/2015 Goal Status: Active Patient's soft tissue infection will resolve Date Initiated: 01/17/2015 Goal Status: Active Signs and symptoms of infection will be recognized early to allow for prompt treatment Date Initiated: 01/17/2015 Goal Status: Active Interventions: Assess signs and symptoms of infection every visit Provide education on infection Screen for HBO Treatment Activities: Culture : 06/01/2015 Culture and sensitivity : 06/01/2015 Education provided on Infection : 04/20/2015 Systemic antibiotics : 06/01/2015 Test ordered outside of clinic : 06/01/2015 Notes: KYNDELL, PARAISO ([id number]) Wound/Skin Impairment Nursing Diagnoses: Impaired tissue integrity Knowledge deficit related to ulceration/compromised skin integrity Goals: Patient/caregiver will verbalize understanding of skin care regimen Date Initiated: 01/10/2015 Goal Status: Active Ulcer/skin breakdown will have a volume reduction of 30% by week 4 Date Initiated: 01/10/2015 Goal Status: Active Ulcer/skin breakdown will have a volume reduction of 50% by week 8 Date Initiated: 01/10/2015 Goal Status: Active Ulcer/skin breakdown will have a volume reduction of 80% by week 12 Date Initiated: 01/10/2015 Goal Status: Active Ulcer/skin breakdown will heal within 14 weeks Date Initiated: 01/10/2015 Goal Status:  Active Interventions: Assess patient/caregiver ability to obtain necessary supplies Assess patient/caregiver ability to perform ulcer/skin care regimen upon admission and as needed Assess ulceration(s) every visit Provide education on smoking Provide education on ulcer and skin care Treatment Activities: Consult for HBO : 06/01/2015 Skin care regimen initiated : 06/01/2015 Topical wound management initiated : 06/01/2015 Notes: Electronic Signature(s) Signed: 06/01/2015 9:03:45 AM By: Regan Lemming BSN, RN Entered By: Regan Lemming on 06/01/2015 09:03:45 [name] ([id number]) -------------------------------------------------------------------------------- Pain Assessment Details Patient Name: [name] Date of Service: 06/01/2015 8:45 AM Medical Record Number: [id number] Patient Account Number: [id number] Date of Birth/Sex: [birth year] (59 y.o. Female) Treating RN: Baruch Gouty, RN, BSN, Velva Harman Primary Care Physician: [name] Other Clinician: Referring Physician: [name] Treating Physician/Extender: Frann Rider in Treatment: 20 Active Problems Location of Pain Severity and Description of Pain Patient Has Paino No Site Locations Pain Management and Medication Current Pain Management: Electronic Signature(s) Signed: 06/01/2015 8:52:59 AM By: Regan Lemming BSN, RN Entered By: Regan Lemming on 06/01/2015 08:52:59 [name] ([id number]) -------------------------------------------------------------------------------- Patient/Caregiver Education Details Patient Name: [name] Date of Service: 06/01/2015 8:45 AM Medical Record Number: [id number] Patient Account Number: [id number] Date of Birth/Gender: [birth year] (59 y.o. Female) Treating RN: Afful, RN, BSN, Velva Harman Primary Care Physician: [name] Other Clinician: Referring Physician: [name] Treating Physician/Extender: Frann Rider in Treatment: 20 Education Assessment Education  Provided To: Patient Education Topics Provided Basic Hygiene: Methods: Explain/Verbal Responses: State content correctly Infection: Methods: Explain/Verbal Responses: State content correctly Safety: Methods: Explain/Verbal Responses: State content correctly Smoking and Wound Healing: Methods: Explain/Verbal Responses: State content correctly Welcome To [facility]: Methods: Explain/Verbal Responses: State content correctly Wound/Skin Impairment: Methods: Explain/Verbal Responses: State content correctly Electronic Signature(s) Signed: 06/01/2015 9:21:29 AM By: Baruch Gouty,  Reduction in Area: [7:80.80%] [N/A:N/A] % Reduction in Volume: 42.70% [N/A:N/A] Classification: [7:Grade 1] [N/A:N/A] Exudate Amount: [7:Small] [N/A:N/A] Exudate Type: [7:Serous] [N/A:N/A] Exudate Color: [7:amber] [N/A:N/A] Wound Margin: [7:Flat and Intact] [N/A:N/A] Granulation Amount: [7:Medium (34-66%)] [N/A:N/A] Granulation Quality: [7:Red, Pink] [N/A:N/A] Necrotic Amount: [7:Medium (34-66%)] [N/A:N/A] Exposed Structures: [7:Fascia: No Fat: No Tendon: No Muscle: No] [N/A:N/A] Joint: No Bone: No Limited to Skin Breakdown Epithelialization: Small (1-33%) N/A N/A Debridement: Open Wound/Selective N/A N/A [id number]) - Selective Time-Out Taken: Yes N/A N/A Pain Control: Lidocaine 4% Topical N/A N/A Solution Tissue Debrided: Callus N/A N/A Level: Non-Viable Tissue N/A N/A Debridement Area (sq 0.2 N/A N/A cm): Instrument: Forceps, Scissors N/A N/A Bleeding: None N/A N/A Procedural Pain: 0 N/A N/A Post Procedural Pain: 0 N/A N/A Debridement Treatment Procedure was tolerated N/A N/A Response:  well Post Debridement 0.4x0.5x0.3 N/A N/A Measurements L x W x D (cm) Post Debridement 0.047 N/A N/A Volume: (cm) Periwound Skin Texture: Callus: Yes N/A N/A Edema: No Excoriation: No Induration: No Crepitus: No Fluctuance: No Friable: No Rash: No Scarring: No Periwound Skin Moist: Yes N/A N/A Moisture: Maceration: No Dry/Scaly: No Periwound Skin Color: Atrophie Blanche: No N/A N/A Cyanosis: No Ecchymosis: No Erythema: No Hemosiderin Staining: No Mottled: No Pallor: No Rubor: No Temperature: No Abnormality N/A N/A Tenderness on No N/A N/A Palpation: Wound Preparation: Ulcer Cleansing: N/A N/A Rinsed/Irrigated with AMANPREET, BONURA ([id number]) Saline Topical Anesthetic Applied: Other: lidocaine 4% Procedures Performed: Debridement N/A N/A Treatment Notes Electronic Signature(s) Signed: 06/01/2015 9:07:07 AM By: Regan Lemming BSN, RN Entered By: Regan Lemming on 06/01/2015 09:07:07 [name] ([id number]) -------------------------------------------------------------------------------- [place] Details Patient Name: [name] Date of Service: 06/01/2015 8:45 AM Medical Record Number: [id number] Patient Account Number: [id number] Date of Birth/Sex: 08-10-1956 (59 y.o. Female) Treating RN: Afful, RN, BSN, Velva Harman Primary Care Physician: [name] Other Clinician: Referring Physician: [name] Treating Physician/Extender: Frann Rider in Treatment: 20 Active Inactive Abuse / Safety / Falls / Self Care Management Nursing Diagnoses: Impaired physical mobility Potential for falls Self care deficit: actual or potential Goals: Patient/caregiver will verbalize understanding of skin care regimen Date Initiated: 01/10/2015 Goal Status: Active Patient/caregiver will verbalize/demonstrate measures taken to improve the patient's personal safety Date Initiated: 01/10/2015 Goal Status: Active Patient/caregiver will  verbalize/demonstrate measures taken to prevent injury and/or falls Date Initiated: 01/10/2015 Goal Status: Active Patient/caregiver will verbalize/demonstrate understanding of what to do in case of emergency Date Initiated: 01/10/2015 Goal Status: Active Interventions: Assess fall risk on admission and as needed Assess impairment of mobility on admission and as needed per policy Provide education on basic hygiene Provide education on fall prevention Provide education on personal and home safety Provide education on safe transfers Treatment Activities: Education provided on Basic Hygiene : 01/10/2015 Notes: Nutrition ADYLAN, MOAD ([id number]) Nursing Diagnoses: Impaired glucose control: actual or potential Potential for alteratiion in Nutrition/Potential for imbalanced nutrition Goals: Patient/caregiver agrees to and verbalizes understanding of need to use nutritional supplements and/or vitamins as prescribed Date Initiated: 01/10/2015 Goal Status: Active Interventions: Assess HgA1c results as ordered upon admission and as needed Treatment Activities: Patient referred to Primary Care Physician for further nutritional evaluation : 06/01/2015 Notes: Orientation to the Wound Care Program Nursing Diagnoses: Knowledge deficit related to the wound healing center program Goals: Patient/caregiver will verbalize understanding of the [facility] Date Initiated: 01/10/2015 Goal Status: Active Interventions: Provide education on orientation to the wound center Notes: Osteomyelitis Nursing Diagnoses: Potential for infection: osteomyelitis Goals: Diagnostic evaluation for  osteomyelitis completed as ordered Date Initiated: 01/17/2015 Goal Status: Active Patient/caregiver will verbalize understanding of disease process and disease management Date Initiated: 01/17/2015 Goal Status: Active Signs and symptoms for osteomyelitis will be recognized and promptly  addressed LULLA, PARTSCH ([id number]) Date Initiated: 01/17/2015 Goal Status: Active Interventions: Assess for signs and symptoms of osteomyelitis resolution every visit Provide education on osteomyelitis Treatment Activities: MRI : 06/01/2015 Test ordered outside of clinic : 06/01/2015 X-ray : 06/01/2015 Notes: Soft Tissue Infection Nursing Diagnoses: Knowledge deficit related to disease process and management Potential for infection: soft tissue Goals: Patient/caregiver will verbalize understanding of or measures to prevent infection and contamination in the home setting Date Initiated: 01/17/2015 Goal Status: Active Patient's soft tissue infection will resolve Date Initiated: 01/17/2015 Goal Status: Active Signs and symptoms of infection will be recognized early to allow for prompt treatment Date Initiated: 01/17/2015 Goal Status: Active Interventions: Assess signs and symptoms of infection every visit Provide education on infection Screen for HBO Treatment Activities: Culture : 06/01/2015 Culture and sensitivity : 06/01/2015 Education provided on Infection : 04/20/2015 Systemic antibiotics : 06/01/2015 Test ordered outside of clinic : 06/01/2015 Notes: KYNDELL, PARAISO ([id number]) Wound/Skin Impairment Nursing Diagnoses: Impaired tissue integrity Knowledge deficit related to ulceration/compromised skin integrity Goals: Patient/caregiver will verbalize understanding of skin care regimen Date Initiated: 01/10/2015 Goal Status: Active Ulcer/skin breakdown will have a volume reduction of 30% by week 4 Date Initiated: 01/10/2015 Goal Status: Active Ulcer/skin breakdown will have a volume reduction of 50% by week 8 Date Initiated: 01/10/2015 Goal Status: Active Ulcer/skin breakdown will have a volume reduction of 80% by week 12 Date Initiated: 01/10/2015 Goal Status: Active Ulcer/skin breakdown will heal within 14 weeks Date Initiated: 01/10/2015 Goal Status:  Active Interventions: Assess patient/caregiver ability to obtain necessary supplies Assess patient/caregiver ability to perform ulcer/skin care regimen upon admission and as needed Assess ulceration(s) every visit Provide education on smoking Provide education on ulcer and skin care Treatment Activities: Consult for HBO : 06/01/2015 Skin care regimen initiated : 06/01/2015 Topical wound management initiated : 06/01/2015 Notes: Electronic Signature(s) Signed: 06/01/2015 9:03:45 AM By: Regan Lemming BSN, RN Entered By: Regan Lemming on 06/01/2015 09:03:45 [name] ([id number]) -------------------------------------------------------------------------------- Pain Assessment Details Patient Name: [name] Date of Service: 06/01/2015 8:45 AM Medical Record Number: [id number] Patient Account Number: [id number] Date of Birth/Sex: [birth year] (59 y.o. Female) Treating RN: Baruch Gouty, RN, BSN, Velva Harman Primary Care Physician: [name] Other Clinician: Referring Physician: [name] Treating Physician/Extender: Frann Rider in Treatment: 20 Active Problems Location of Pain Severity and Description of Pain Patient Has Paino No Site Locations Pain Management and Medication Current Pain Management: Electronic Signature(s) Signed: 06/01/2015 8:52:59 AM By: Regan Lemming BSN, RN Entered By: Regan Lemming on 06/01/2015 08:52:59 [name] ([id number]) -------------------------------------------------------------------------------- Patient/Caregiver Education Details Patient Name: [name] Date of Service: 06/01/2015 8:45 AM Medical Record Number: [id number] Patient Account Number: [id number] Date of Birth/Gender: [birth year] (59 y.o. Female) Treating RN: Afful, RN, BSN, Velva Harman Primary Care Physician: [name] Other Clinician: Referring Physician: [name] Treating Physician/Extender: Frann Rider in Treatment: 20 Education Assessment Education  Provided To: Patient Education Topics Provided Basic Hygiene: Methods: Explain/Verbal Responses: State content correctly Infection: Methods: Explain/Verbal Responses: State content correctly Safety: Methods: Explain/Verbal Responses: State content correctly Smoking and Wound Healing: Methods: Explain/Verbal Responses: State content correctly Welcome To [facility]: Methods: Explain/Verbal Responses: State content correctly Wound/Skin Impairment: Methods: Explain/Verbal Responses: State content correctly Electronic Signature(s) Signed: 06/01/2015 9:21:29 AM By: Baruch Gouty,  osteomyelitis completed as ordered Date Initiated: 01/17/2015 Goal Status: Active Patient/caregiver will verbalize understanding of disease process and disease management Date Initiated: 01/17/2015 Goal Status: Active Signs and symptoms for osteomyelitis will be recognized and promptly  addressed LULLA, PARTSCH ([id number]) Date Initiated: 01/17/2015 Goal Status: Active Interventions: Assess for signs and symptoms of osteomyelitis resolution every visit Provide education on osteomyelitis Treatment Activities: MRI : 06/01/2015 Test ordered outside of clinic : 06/01/2015 X-ray : 06/01/2015 Notes: Soft Tissue Infection Nursing Diagnoses: Knowledge deficit related to disease process and management Potential for infection: soft tissue Goals: Patient/caregiver will verbalize understanding of or measures to prevent infection and contamination in the home setting Date Initiated: 01/17/2015 Goal Status: Active Patient's soft tissue infection will resolve Date Initiated: 01/17/2015 Goal Status: Active Signs and symptoms of infection will be recognized early to allow for prompt treatment Date Initiated: 01/17/2015 Goal Status: Active Interventions: Assess signs and symptoms of infection every visit Provide education on infection Screen for HBO Treatment Activities: Culture : 06/01/2015 Culture and sensitivity : 06/01/2015 Education provided on Infection : 04/20/2015 Systemic antibiotics : 06/01/2015 Test ordered outside of clinic : 06/01/2015 Notes: KYNDELL, PARAISO ([id number]) Wound/Skin Impairment Nursing Diagnoses: Impaired tissue integrity Knowledge deficit related to ulceration/compromised skin integrity Goals: Patient/caregiver will verbalize understanding of skin care regimen Date Initiated: 01/10/2015 Goal Status: Active Ulcer/skin breakdown will have a volume reduction of 30% by week 4 Date Initiated: 01/10/2015 Goal Status: Active Ulcer/skin breakdown will have a volume reduction of 50% by week 8 Date Initiated: 01/10/2015 Goal Status: Active Ulcer/skin breakdown will have a volume reduction of 80% by week 12 Date Initiated: 01/10/2015 Goal Status: Active Ulcer/skin breakdown will heal within 14 weeks Date Initiated: 01/10/2015 Goal Status:  Active Interventions: Assess patient/caregiver ability to obtain necessary supplies Assess patient/caregiver ability to perform ulcer/skin care regimen upon admission and as needed Assess ulceration(s) every visit Provide education on smoking Provide education on ulcer and skin care Treatment Activities: Consult for HBO : 06/01/2015 Skin care regimen initiated : 06/01/2015 Topical wound management initiated : 06/01/2015 Notes: Electronic Signature(s) Signed: 06/01/2015 9:03:45 AM By: Regan Lemming BSN, RN Entered By: Regan Lemming on 06/01/2015 09:03:45 [name] ([id number]) -------------------------------------------------------------------------------- Pain Assessment Details Patient Name: [name] Date of Service: 06/01/2015 8:45 AM Medical Record Number: [id number] Patient Account Number: [id number] Date of Birth/Sex: [birth year] (59 y.o. Female) Treating RN: Baruch Gouty, RN, BSN, Velva Harman Primary Care Physician: [name] Other Clinician: Referring Physician: [name] Treating Physician/Extender: Frann Rider in Treatment: 20 Active Problems Location of Pain Severity and Description of Pain Patient Has Paino No Site Locations Pain Management and Medication Current Pain Management: Electronic Signature(s) Signed: 06/01/2015 8:52:59 AM By: Regan Lemming BSN, RN Entered By: Regan Lemming on 06/01/2015 08:52:59 [name] ([id number]) -------------------------------------------------------------------------------- Patient/Caregiver Education Details Patient Name: [name] Date of Service: 06/01/2015 8:45 AM Medical Record Number: [id number] Patient Account Number: [id number] Date of Birth/Gender: [birth year] (59 y.o. Female) Treating RN: Afful, RN, BSN, Velva Harman Primary Care Physician: [name] Other Clinician: Referring Physician: [name] Treating Physician/Extender: Frann Rider in Treatment: 20 Education Assessment Education  Provided To: Patient Education Topics Provided Basic Hygiene: Methods: Explain/Verbal Responses: State content correctly Infection: Methods: Explain/Verbal Responses: State content correctly Safety: Methods: Explain/Verbal Responses: State content correctly Smoking and Wound Healing: Methods: Explain/Verbal Responses: State content correctly Welcome To [facility] [facility]: Methods: Explain/Verbal Responses: State content correctly Wound/Skin Impairment: Methods: Explain/Verbal Responses: State content correctly Electronic Signature(s) Signed: 06/01/2015 9:21:29 AM By: Baruch Gouty,

## 2015-06-01 NOTE — Telephone Encounter (Signed)
Called patient back and left message for her to call back and schedule an appointment.

## 2015-06-01 NOTE — Telephone Encounter (Signed)
See next note

## 2015-06-01 NOTE — Progress Notes (Signed)
SHAMEIKA, GERMANI ([id number]) Visit Report for 06/01/2015 Arrival Information Details Patient Name: [name], [name] Date of Service: 06/01/2015 10:00 AM Medical Record Number: [id number] Patient Account Number: [id number] Date of Birth/Sex: Jun 08, 1956 (59 y.o. Female) Treating RN: Primary Care Physician: [name] Other Clinician: Jacqulyn Bath Referring Physician: [name] Treating Physician/Extender: Frann Rider in Treatment: 47 Visit Information History Since Last Visit Added or deleted any medications: No Patient Arrived: Wheel Chair Any new allergies or adverse reactions: No Arrival Time: 09:18 Had a fall or experienced change in No Accompanied By: husband activities of daily living that may affect Transfer Assistance: Manual risk of falls: Patient Identification Verified: Yes Signs or symptoms of abuse/neglect No Secondary Verification Process Yes since last visito Completed: Hospitalized since last visit: No Patient Requires Transmission- No Has Dressing in Place as Prescribed: Yes Based Precautions: Has Footwear/Offloading in Place as Yes Patient Has Alerts: Yes Prescribed: Patient Alerts: Patient on Blood Left: Surgical Shoe with Thinner Pressure Relief ABI L:0.8, Insole R:0.93 Right: Surgical Shoe with Pressure Relief Insole Pain Present Now: No Electronic Signature(s) Signed: 06/01/2015 3:30:24 PM By: Lorine Bears RCP, RRT, CHT Entered By: Lorine Bears on 06/01/2015 09:18:54 [name] ([id number]) -------------------------------------------------------------------------------- Encounter Discharge Information Details Patient Name: [name] Date of Service: 06/01/2015 10:00 AM Medical Record Number: [id number] Patient Account Number: [id number] Date of Birth/Sex: Aug 27, 1956 (59 y.o. Female) Treating RN: Primary Care Physician: [name] Other Clinician: Jacqulyn Bath Referring  Physician: [name] Treating Physician/Extender: Frann Rider in Treatment: 20 Encounter Discharge Information Items Discharge Pain Level: 0 Discharge Condition: Stable Ambulatory Status: Wheelchair Discharge Destination: Home Private Transportation: Auto Accompanied By: husband Schedule Follow-up Appointment: No Medication Reconciliation completed and No provided to Patient/Care Kyston Gonce: Clinical Summary of Care: Notes Patient has an HBO treatment scheduled on 06/02/15 at 10:00 am. Electronic Signature(s) Signed: 06/01/2015 3:30:24 PM By: Lorine Bears RCP, RRT, CHT Entered By: Lorine Bears on 06/01/2015 11:54:00 [name] ([id number]) -------------------------------------------------------------------------------- Vitals Details Patient Name: [name] Date of Service: 06/01/2015 10:00 AM Medical Record Number: [id number] Patient Account Number: [id number] Date of Birth/Sex: 25-May-1956 (59 y.o. Female) Treating RN: Primary Care Physician: [name] Other Clinician: Jacqulyn Bath Referring Physician: [name] Treating Physician/Extender: Frann Rider in Treatment: 20 Vital Signs Time Taken: 08:54 Temperature (F): 97.5 Height (in): 68 Pulse (bpm): 89 Weight (lbs): 257 Respiratory Rate (breaths/min): 18 Body Mass Index (BMI): 39.1 Blood Pressure (mmHg): 119/69 Capillary Blood Glucose (mg/dl): 248 Reference Range: 80 - 120 mg / dl Electronic Signature(s) Signed: 06/01/2015 3:30:24 PM By: Lorine Bears RCP, RRT, CHT Entered By: Lorine Bears on 06/01/2015 09:19:19

## 2015-06-02 ENCOUNTER — Encounter: Payer: Self-pay | Admitting: Family Medicine

## 2015-06-02 ENCOUNTER — Encounter: Payer: PPO | Admitting: Surgery

## 2015-06-02 DIAGNOSIS — L97512 Non-pressure chronic ulcer of other part of right foot with fat layer exposed: Secondary | ICD-10-CM | POA: Diagnosis not present

## 2015-06-02 LAB — GLUCOSE, CAPILLARY
GLUCOSE-CAPILLARY: 189 mg/dL — AB (ref 65–99)
GLUCOSE-CAPILLARY: 159 mg/dL — AB (ref 65–99)

## 2015-06-02 NOTE — Progress Notes (Signed)
LYNZEE, HOWLE ([id number]) Visit Report for 06/02/2015 Arrival Information Details Patient Name: [name], [name] Date of Service: 06/02/2015 10:00 AM Medical Record Number: [id number] Patient Account Number: [id number] Date of Birth/Sex: November 26, 1955 (59 y.o. Female) Treating RN: Primary Care Physician: [name] Other Clinician: Jacqulyn Bath Referring Physician: [name] Treating Physician/Extender: Frann Rider in Treatment: 41 Visit Information History Since Last Visit Added or deleted any medications: No Patient Arrived: Wheel Chair Any new allergies or adverse reactions: No Arrival Time: 08:40 Had a fall or experienced change in No Accompanied By: husband activities of daily living that may affect Transfer Assistance: Manual risk of falls: Patient Identification Verified: Yes Signs or symptoms of abuse/neglect No Secondary Verification Process Yes since last visito Completed: Has Dressing in Place as Prescribed: Yes Patient Requires Transmission- No Has Footwear/Offloading in Place as Yes Based Precautions: Prescribed: Patient Has Alerts: Yes Left: Surgical Shoe with Patient Alerts: Patient on Blood Pressure Relief Thinner Insole ABI L:0.8, Right: Surgical Shoe with R:0.93 Pressure Relief Insole Pain Present Now: No Electronic Signature(s) Signed: 06/02/2015 11:40:02 AM By: Lorine Bears RCP, RRT, CHT Entered By: Lorine Bears on 06/02/2015 09:00:57 [name] ([id number]) -------------------------------------------------------------------------------- Encounter Discharge Information Details Patient Name: [name] Date of Service: 06/02/2015 10:00 AM Medical Record Number: [id number] Patient Account Number: [id number] Date of Birth/Sex: 1955-11-13 (59 y.o. Female) Treating RN: Primary Care Physician: [name] Other Clinician: Jacqulyn Bath Referring Physician: [name] Treating  Physician/Extender: Frann Rider in Treatment: 20 Encounter Discharge Information Items Discharge Pain Level: 0 Discharge Condition: Stable Ambulatory Status: Wheelchair Discharge Destination: Home Private Transportation: Auto Accompanied By: husband Schedule Follow-up Appointment: No Medication Reconciliation completed and No provided to Patient/Care Corda Shutt: Clinical Summary of Care: Notes Patient has an HBO treatment scheduled on 06/05/15 at 10:00 am. Electronic Signature(s) Signed: 06/02/2015 11:40:02 AM By: Lorine Bears RCP, RRT, CHT Entered By: Lorine Bears on 06/02/2015 11:21:39 [name] ([id number]) -------------------------------------------------------------------------------- Vitals Details Patient Name: [name] Date of Service: 06/02/2015 10:00 AM Medical Record Number: [id number] Patient Account Number: [id number] Date of Birth/Sex: 1956-08-29 (59 y.o. Female) Treating RN: Primary Care Physician: [name] Other Clinician: Jacqulyn Bath Referring Physician: [name] Treating Physician/Extender: Frann Rider in Treatment: 20 Vital Signs Time Taken: 08:45 Temperature (F): 97.8 Height (in): 68 Pulse (bpm): 84 Weight (lbs): 257 Respiratory Rate (breaths/min): 18 Body Mass Index (BMI): 39.1 Blood Pressure (mmHg): 96/58 Capillary Blood Glucose (mg/dl): 189 Reference Range: 80 - 120 mg / dl Electronic Signature(s) Signed: 06/02/2015 11:40:02 AM By: Lorine Bears RCP, RRT, CHT Entered By: Lorine Bears on 06/02/2015 09:01:25

## 2015-06-02 NOTE — Progress Notes (Signed)
[facility] of regression in wound condition at [phone]. Please direct any NON-WOUND related issues/requests for orders to patient's Primary Care Physician We will continue with Prisma Hg over the right second toe and offloaded with a piece of felt and gauze so that the toe does not have constant pressure. She is also using a Peg assist shoe. She has finished 30 treatments of hyperbaric oxygen therapy and will finish the remaining prescribed course. Electronic Signature(s) Signed: 06/01/2015 9:14:57 AM By: Christin Fudge MD, FACS Entered By: Christin Fudge on 06/01/2015 09:14:57 [name] ([id number]) -------------------------------------------------------------------------------- SuperBill Details Patient Name: [name] Date of Service: 06/01/2015 Medical Record Number: [id number] Patient Account Number: [id number] Date of Birth/Sex: 12/13/1955 (59 y.o. 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 Facility Procedures CPT4 Code Description: [id number] - DEBRIDE WOUND 1ST 20 SQ CM OR < ICD-10 Description Diagnosis E11.621 Type 2 diabetes mellitus with foot ulcer L97.512 Non-pressure chronic ulcer of other part of right foo Modifier: t with fat la Quantity: 1 yer  exposed Physician Procedures CPT4 Code Description: [id number] - WC PHYS DEBR WO ANESTH 20 SQ CM ICD-10 Description Diagnosis E11.621 Type 2 diabetes mellitus with foot ulcer L97.512 Non-pressure chronic ulcer of other part of right foo Modifier: t with fat lay Quantity: 1 er exposed Electronic Signature(s) Signed: 06/01/2015 9:15:07 AM By: Christin Fudge MD, FACS Entered By: Christin Fudge on 06/01/2015 09:15:07  [facility] of regression in wound condition at [phone]. Please direct any NON-WOUND related issues/requests for orders to patient's Primary Care Physician We will continue with Prisma Hg over the right second toe and offloaded with a piece of felt and gauze so that the toe does not have constant pressure. She is also using a Peg assist shoe. She has finished 30 treatments of hyperbaric oxygen therapy and will finish the remaining prescribed course. Electronic Signature(s) Signed: 06/01/2015 9:14:57 AM By: Christin Fudge MD, FACS Entered By: Christin Fudge on 06/01/2015 09:14:57 [name] ([id number]) -------------------------------------------------------------------------------- SuperBill Details Patient Name: [name] Date of Service: 06/01/2015 Medical Record Number: [id number] Patient Account Number: [id number] Date of Birth/Sex: 12/13/1955 (59 y.o. 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 Facility Procedures CPT4 Code Description: [id number] - DEBRIDE WOUND 1ST 20 SQ CM OR < ICD-10 Description Diagnosis E11.621 Type 2 diabetes mellitus with foot ulcer L97.512 Non-pressure chronic ulcer of other part of right foo Modifier: t with fat la Quantity: 1 yer  exposed Physician Procedures CPT4 Code Description: [id number] - WC PHYS DEBR WO ANESTH 20 SQ CM ICD-10 Description Diagnosis E11.621 Type 2 diabetes mellitus with foot ulcer L97.512 Non-pressure chronic ulcer of other part of right foo Modifier: t with fat lay Quantity: 1 er exposed Electronic Signature(s) Signed: 06/01/2015 9:15:07 AM By: Christin Fudge MD, FACS Entered By: Christin Fudge on 06/01/2015 09:15:07  [facility] of regression in wound condition at [phone]. Please direct any NON-WOUND related issues/requests for orders to patient's Primary Care Physician We will continue with Prisma Hg over the right second toe and offloaded with a piece of felt and gauze so that the toe does not have constant pressure. She is also using a Peg assist shoe. She has finished 30 treatments of hyperbaric oxygen therapy and will finish the remaining prescribed course. Electronic Signature(s) Signed: 06/01/2015 9:14:57 AM By: Christin Fudge MD, FACS Entered By: Christin Fudge on 06/01/2015 09:14:57 [name] ([id number]) -------------------------------------------------------------------------------- SuperBill Details Patient Name: [name] Date of Service: 06/01/2015 Medical Record Number: [id number] Patient Account Number: [id number] Date of Birth/Sex: 12/13/1955 (59 y.o. 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 Facility Procedures CPT4 Code Description: [id number] - DEBRIDE WOUND 1ST 20 SQ CM OR < ICD-10 Description Diagnosis E11.621 Type 2 diabetes mellitus with foot ulcer L97.512 Non-pressure chronic ulcer of other part of right foo Modifier: t with fat la Quantity: 1 yer  exposed Physician Procedures CPT4 Code Description: [id number] - WC PHYS DEBR WO ANESTH 20 SQ CM ICD-10 Description Diagnosis E11.621 Type 2 diabetes mellitus with foot ulcer L97.512 Non-pressure chronic ulcer of other part of right foo Modifier: t with fat lay Quantity: 1 er exposed Electronic Signature(s) Signed: 06/01/2015 9:15:07 AM By: Christin Fudge MD, FACS Entered By: Christin Fudge on 06/01/2015 09:15:07  [facility] of regression in wound condition at [phone]. Please direct any NON-WOUND related issues/requests for orders to patient's Primary Care Physician We will continue with Prisma Hg over the right second toe and offloaded with a piece of felt and gauze so that the toe does not have constant pressure. She is also using a Peg assist shoe. She has finished 30 treatments of hyperbaric oxygen therapy and will finish the remaining prescribed course. Electronic Signature(s) Signed: 06/01/2015 9:14:57 AM By: Christin Fudge MD, FACS Entered By: Christin Fudge on 06/01/2015 09:14:57 [name] ([id number]) -------------------------------------------------------------------------------- SuperBill Details Patient Name: [name] Date of Service: 06/01/2015 Medical Record Number: [id number] Patient Account Number: [id number] Date of Birth/Sex: 12/13/1955 (59 y.o. 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 Facility Procedures CPT4 Code Description: [id number] - DEBRIDE WOUND 1ST 20 SQ CM OR < ICD-10 Description Diagnosis E11.621 Type 2 diabetes mellitus with foot ulcer L97.512 Non-pressure chronic ulcer of other part of right foo Modifier: t with fat la Quantity: 1 yer  exposed Physician Procedures CPT4 Code Description: [id number] - WC PHYS DEBR WO ANESTH 20 SQ CM ICD-10 Description Diagnosis E11.621 Type 2 diabetes mellitus with foot ulcer L97.512 Non-pressure chronic ulcer of other part of right foo Modifier: t with fat lay Quantity: 1 er exposed Electronic Signature(s) Signed: 06/01/2015 9:15:07 AM By: Christin Fudge MD, FACS Entered By: Christin Fudge on 06/01/2015 09:15:07  [facility] of [facility] in wound condition at [phone]. Please direct any NON-WOUND related issues/requests for orders to patient's Primary Care Physician We will continue with Prisma Hg over the right second toe and offloaded with a piece of felt and gauze so that the toe does not have constant pressure. She is also using a Peg assist shoe. She has finished 30 treatments of hyperbaric oxygen therapy and will finish the remaining prescribed course. Electronic Signature(s) Signed: 06/01/2015 9:14:57 AM By: Christin Fudge MD, FACS Entered By: Christin Fudge on 06/01/2015 09:14:57 [name] ([id number]) -------------------------------------------------------------------------------- SuperBill Details Patient Name: [name] Date of Service: 06/01/2015 Medical Record Number: [id number] Patient Account Number: [id number] Date of Birth/Sex: 12/13/1955 (59 y.o. 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 Facility Procedures CPT4 Code Description: [id number] - DEBRIDE WOUND 1ST 20 SQ CM OR < ICD-10 Description Diagnosis E11.621 Type 2 diabetes mellitus with foot ulcer L97.512 Non-pressure chronic ulcer of other part of right foo Modifier: t with fat la Quantity: 1 yer  exposed Physician Procedures CPT4 Code Description: [id number] - WC PHYS DEBR WO ANESTH 20 SQ CM ICD-10 Description Diagnosis E11.621 Type 2 diabetes mellitus with foot ulcer L97.512 Non-pressure chronic ulcer of other part of right foo Modifier: t with fat lay Quantity: 1 er exposed Electronic Signature(s) Signed: 06/01/2015 9:15:07 AM By: Christin Fudge MD, FACS Entered By: Christin Fudge on 06/01/2015 09:15:07  Physician: [name] Treating Physician/Extender: Frann Rider in Treatment: 20 Active Problems ICD-10 Encounter Code Description Active Date Diagnosis E11.621 Type 2 diabetes mellitus with foot ulcer 01/10/2015 Yes [id number] Chronic systolic (congestive) heart failure 01/10/2015 Yes E11.42 Type 2 diabetes mellitus with diabetic polyneuropathy 01/10/2015 Yes 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, left ankle and foot 02/13/2015 Yes L97.512 Non-pressure chronic ulcer of other part of right foot with 03/10/2015 Yes fat layer exposed Inactive Problems Resolved Problems ICD-10 Code Description Active Date Resolved Date L97.422 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 01/10/2015 01/10/2015 layer exposed L97.522 Non-pressure chronic ulcer of other part of left foot with fat 01/10/2015 01/10/2015 layer exposed L02.611 Cutaneous abscess of right foot 04/20/2015 04/20/2015 S00.33XA Contusion of nose, initial encounter 01/17/2015 01/17/2015 [name], [name] ([id number]) 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 Female) Treating RN: Afful, RN, BSN, [facility] Primary Care Physician: [name] Other Clinician: Referring Physician: [name] Treating Physician/Extender: Frann Rider in Treatment: 20 Subjective Chief Complaint Information obtained from Patient Patient presents to the wound care center for a consult due non healing wound 59 year old patient who comes with a history of an ulcer on the left second toe for at least about 4 months and a history on her left heel for about 3 weeks. I needed to get an MRI of her left foot but due to her defibrillator this could not be done and hence she is getting get a triple phase bone scan. 01/17/2015 -- she has developed a significant injury to the bridge of the nose where her CPAP machine was fitting. This is a painfull dark spot. History of Present Illness (HPI) 58 year old patient who is known to have diabetes for several years and generally has it under good control last hemoglobin A1c was 6.7 done 2 weeks ago. She has several other comorbidities including acute CHF, hypoxia, hyperlipidemia, systolic heart failure, myocardial infarction, coronary artery disease, iron deficiency anemia, renal insufficiency, essential hypertension, morbid obesity and obstructive sleep apnea. 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 In the past she's had a coronary angioplasty with stent in 2013, laparoscopic cholecystectomy in 2011, bilateral oophorectomy in April 2012 and she's had some lumbar disc surgery in 1996. She says she's had a lot of peripheral neuropathy and due to this she's had a ulcer on her left second toe for a while. She had a dry eschar on her left heel and recently this was removed and she found that she had some infection and some drainage from there. She has minimal pain around the heel but no pain on any areas of her toes. she has  not been on any antibiotics recently and has known vascular problems in the recent past. 01/17/2015 left foot x-ray done on April 5 reveals there are destructive changes noted of the distal aspect of the proximal phalanx of the left second digit with extension into the adjacent proximal interphalangeal joint space. These findings suggest osteomyelitis with possible septic arthritis. I was able to make a call to Dr. Nevada Crane the radiologist, and had a detailed discussion with him  [facility] of regression in wound condition at [phone]. Please direct any NON-WOUND related issues/requests for orders to patient's Primary Care Physician We will continue with Prisma Hg over the right second toe and offloaded with a piece of felt and gauze so that the toe does not have constant pressure. She is also using a Peg assist shoe. She has finished 30 treatments of hyperbaric oxygen therapy and will finish the remaining prescribed course. Electronic Signature(s) Signed: 06/01/2015 9:14:57 AM By: Christin Fudge MD, FACS Entered By: Christin Fudge on 06/01/2015 09:14:57 [name] ([id number]) -------------------------------------------------------------------------------- SuperBill Details Patient Name: [name] Date of Service: 06/01/2015 Medical Record Number: [id number] Patient Account Number: [id number] Date of Birth/Sex: 12/13/1955 (59 y.o. 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 Facility Procedures CPT4 Code Description: [id number] - DEBRIDE WOUND 1ST 20 SQ CM OR < ICD-10 Description Diagnosis E11.621 Type 2 diabetes mellitus with foot ulcer L97.512 Non-pressure chronic ulcer of other part of right foo Modifier: t with fat la Quantity: 1 yer  exposed Physician Procedures CPT4 Code Description: [id number] - WC PHYS DEBR WO ANESTH 20 SQ CM ICD-10 Description Diagnosis E11.621 Type 2 diabetes mellitus with foot ulcer L97.512 Non-pressure chronic ulcer of other part of right foo Modifier: t with fat lay Quantity: 1 er exposed Electronic Signature(s) Signed: 06/01/2015 9:15:07 AM By: Christin Fudge MD, FACS Entered By: Christin Fudge on 06/01/2015 09:15:07  Physician: [name] Treating Physician/Extender: Frann Rider in Treatment: 20 Active Problems ICD-10 Encounter Code Description Active Date Diagnosis E11.621 Type 2 diabetes mellitus with foot ulcer 01/10/2015 Yes [id number] Chronic systolic (congestive) heart failure 01/10/2015 Yes E11.42 Type 2 diabetes mellitus with diabetic polyneuropathy 01/10/2015 Yes 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, left ankle and foot 02/13/2015 Yes L97.512 Non-pressure chronic ulcer of other part of right foot with 03/10/2015 Yes fat layer exposed Inactive Problems Resolved Problems ICD-10 Code Description Active Date Resolved Date L97.422 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 01/10/2015 01/10/2015 layer exposed L97.522 Non-pressure chronic ulcer of other part of left foot with fat 01/10/2015 01/10/2015 layer exposed L02.611 Cutaneous abscess of right foot 04/20/2015 04/20/2015 S00.33XA Contusion of nose, initial encounter 01/17/2015 01/17/2015 [name], [name] ([id number]) 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 Female) Treating RN: Afful, RN, BSN, Velva Harman Primary Care Physician: [name] Other Clinician: Referring Physician: [name] Treating Physician/Extender: Frann Rider in Treatment: 20 Subjective Chief Complaint Information obtained from Patient Patient presents to the wound care center for a consult due non healing wound 59 year old patient who comes with a history of an ulcer on the left second toe for at least about 4 months and a history on her left heel for about 3 weeks. I needed to get an MRI of her left foot but due to her defibrillator this could not be done and hence she is getting get a triple phase bone scan. 01/17/2015 -- she has developed a significant injury to the bridge of the nose where her CPAP machine was fitting. This is a painfull dark spot. History of Present Illness (HPI) 58 year old patient who is known to have diabetes for several years and generally has it under good control last hemoglobin A1c was 6.7 done 2 weeks ago. She has several other comorbidities including acute CHF, hypoxia, hyperlipidemia, systolic heart failure, myocardial infarction, coronary artery disease, iron deficiency anemia, renal insufficiency, essential hypertension, morbid obesity and obstructive sleep apnea. 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 In the past she's had a coronary angioplasty with stent in 2013, laparoscopic cholecystectomy in 2011, bilateral oophorectomy in April 2012 and she's had some lumbar disc surgery in 1996. She says she's had a lot of peripheral neuropathy and due to this she's had a ulcer on her left second toe for a while. She had a dry eschar on her left heel and recently this was removed and she found that she had some infection and some drainage from there. She has minimal pain around the heel but no pain on any areas of her toes. she has  not been on any antibiotics recently and has known vascular problems in the recent past. 01/17/2015 left foot x-ray done on April 5 reveals there are destructive changes noted of the distal aspect of the proximal phalanx of the left second digit with extension into the adjacent proximal interphalangeal joint space. These findings suggest osteomyelitis with possible septic arthritis. I was able to make a call to Dr. [place] Crane the radiologist, and had a detailed discussion with him  [facility] of regression in wound condition at [phone]. Please direct any NON-WOUND related issues/requests for orders to patient's Primary Care Physician We will continue with Prisma Hg over the right second toe and offloaded with a piece of felt and gauze so that the toe does not have constant pressure. She is also using a Peg assist shoe. She has finished 30 treatments of hyperbaric oxygen therapy and will finish the remaining prescribed course. Electronic Signature(s) Signed: 06/01/2015 9:14:57 AM By: Christin Fudge MD, FACS Entered By: Christin Fudge on 06/01/2015 09:14:57 [name] ([id number]) -------------------------------------------------------------------------------- SuperBill Details Patient Name: [name] Date of Service: 06/01/2015 Medical Record Number: [id number] Patient Account Number: [id number] Date of Birth/Sex: 12/13/1955 (59 y.o. 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 Facility Procedures CPT4 Code Description: [id number] - DEBRIDE WOUND 1ST 20 SQ CM OR < ICD-10 Description Diagnosis E11.621 Type 2 diabetes mellitus with foot ulcer L97.512 Non-pressure chronic ulcer of other part of right foo Modifier: t with fat la Quantity: 1 yer  exposed Physician Procedures CPT4 Code Description: [id number] - WC PHYS DEBR WO ANESTH 20 SQ CM ICD-10 Description Diagnosis E11.621 Type 2 diabetes mellitus with foot ulcer L97.512 Non-pressure chronic ulcer of other part of right foo Modifier: t with fat lay Quantity: 1 er exposed Electronic Signature(s) Signed: 06/01/2015 9:15:07 AM By: Christin Fudge MD, FACS Entered By: Christin Fudge on 06/01/2015 09:15:07

## 2015-06-02 NOTE — Progress Notes (Signed)
DANIELL, HOUSEWRIGHT ([id number]) Visit Report for 06/02/2015 HBO Details Patient Name: [name], [name] Date of Service: 06/02/2015 10:00 AM Medical Record Number: [id number] Patient Account Number: [id number] Date of Birth/Sex: 06-09-1956 (59 y.o. 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 HBO Treatment Details Treatment Number: 33 Patient Type: Outpatient Chamber Type: Monoplace Chamber #: HBO [id number] Treatment Protocol: 2.0 ATA with 90 minutes oxygen, and no air breaks Treatment Details Compression Rate Down: 1.5 psi / minute De-Compression Rate Up: 1.5 psi / minute Air breaks and breathing Compress Tx Pressure Decompress Decompress periods Begins Reached Begins Ends (leave unused spaces blank) Chamber Pressure 1 ATA 2.0 ATA - - - - - - 2.0 ATA 1 ATA Clock Time (24 hr) 08:59 09:09 - - - - - - 10:39 10:49 Treatment Length: 110 (minutes) Treatment Segments: 4 Capillary Blood Glucose Pre Capillary Blood Glucose (mg/dl): Post Capillary Blood Glucose (mg/dl): Vital Signs Capillary Blood Glucose Reference Range: 80 - 120 mg / dl HBO Diabetic Blood Glucose Intervention Range: <131 mg/dl or >249 mg/dl Time Vitals Blood Respiratory Capillary Blood Glucose Pulse Action Type: Pulse: Temperature: Taken: Pressure: Rate: Glucose (mg/dl): Meter #: Oximetry (%) Taken: Pre 08:45 96/58 84 18 97.8 189 1 none Post 10:55 126/80 78 18 97.7 159 1 none Treatment Response Well MELENY, ROCHESTER ([id number]) Treatment Toleration: Treatment Treatment Completed without Adverse Event Completion Status: HBO  Attestation I certify that I supervised this HBO treatment in accordance with Medicare guidelines. A trained Yes emergency response team is readily available per hospital policies and procedures. Continue HBOT as ordered. Yes Electronic Signature(s) Signed: 06/02/2015 11:53:43 AM By: Christin Fudge MD, FACS Previous Signature: 06/02/2015 11:40:02 AM Version By: Lorine Bears RCP, RRT, CHT Entered By: Christin Fudge on 06/02/2015 11:53:43 [name] ([id number]) -------------------------------------------------------------------------------- HBO Safety Checklist Details Patient Name: [name] Date of Service: 06/02/2015 10:00 AM Medical Record Number: [id number] Patient Account Number: [id number] Date of Birth/Sex: 07-17-1956 (59 y.o. Female) Treating RN: Primary Care Physician: [name] Other Clinician: Jacqulyn Bath Referring Physician: [name] Treating Physician/Extender: Frann Rider in Treatment: 20 HBO Safety Checklist Items Safety Checklist Consent Form Signed Patient voided / foley secured and emptied When did you last eato 10:30 pm on 06/01/15 Last dose of injectable or oral agent 08:00 am NA Ostomy pouch emptied and vented if applicable All implantable devices assessed, documented and approved ICD NA Intravenous access site secured and place Valuables secured Linens and cotton and cotton/polyester blend (less than 51% polyester) Personal oil-based products / skin lotions / body lotions removed Wigs or hairpieces removed Smoking or tobacco materials removed Books / newspapers / magazines / loose paper removed Cologne, aftershave, perfume and deodorant removed Jewelry removed (may wrap wedding band) Make-up removed Hair care products removed Battery operated devices (external) removed Heating patches and chemical warmers removed NA Titanium eyewear removed NA Nail polish cured greater than 10 hours NA Casting material  cured greater than 10 hours NA Hearing aids removed NA Loose dentures or partials removed NA Prosthetics have been removed Patient demonstrates correct use of air break device (if applicable) Patient concerns have been addressed Patient grounding bracelet on and cord attached to chamber Specifics for Inpatients (complete in addition to above)  Medication sheet sent with patient Intravenous medications needed or due during therapy sent with patient KANON, THRALL ([id number]) Drainage tubes (e.g. nasogastric tube or chest tube secured and vented) Endotracheal or Tracheotomy tube secured Cuff deflated of air and inflated with saline Airway suctioned Electronic Signature(s) Signed: 06/02/2015 11:40:02 AM By: Lorine Bears RCP, RRT, CHT Entered By: Lorine Bears on 06/02/2015 09:02:22

## 2015-06-02 NOTE — Progress Notes (Signed)
AVISHI, WETZEL ([id number]) Visit Report for 06/02/2015 Problem List Details Patient Name: [name], [name] Date of Service: 06/02/2015 10:00 AM Medical Record Number: [id number] Patient Account Number: [id number] Date of Birth/Sex: [birth year] (59 y.o. 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/10/2015 Yes fat layer exposed Inactive Problems Resolved Problems ICD-10 Code Description Active Date Resolved Date L97.422 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 01/10/2015 01/10/2015 layer exposed L97.522 Non-pressure chronic ulcer of other part of left foot with fat 01/10/2015 01/10/2015 layer exposed S00.33XA Contusion of nose, initial encounter 01/17/2015 01/17/2015 [name], [name] ([id number]) 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 Facility Procedures CPT4 Code: [id number] Description: (Facility Use Only) HBOT, full body chamber, 33min Modifier: Quantity: 4 Physician Procedures CPT4 Code: [id number] Description: [id number] - WC PHYS HYPERBARIC OXYGEN THERAPY ICD-10 Description Diagnosis 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, left ankle and foo E11.621 Type 2 diabetes mellitus with foot ulcer Modifier: t Quantity: 1 Electronic Signature(s) Signed: 06/02/2015 11:53:58 AM By: Christin Fudge MD, FACS Previous Signature: 06/02/2015 11:40:02 AM Version By: Lorine Bears RCP, RRT, CHT Entered By: Christin Fudge on 06/02/2015 11:53:57

## 2015-06-02 NOTE — Telephone Encounter (Signed)
Noted; agree with plan as outlined by PA Clark.

## 2015-06-05 ENCOUNTER — Encounter (HOSPITAL_BASED_OUTPATIENT_CLINIC_OR_DEPARTMENT_OTHER): Payer: PPO | Admitting: General Surgery

## 2015-06-05 DIAGNOSIS — L97512 Non-pressure chronic ulcer of other part of right foot with fat layer exposed: Secondary | ICD-10-CM | POA: Diagnosis not present

## 2015-06-05 DIAGNOSIS — E10621 Type 1 diabetes mellitus with foot ulcer: Secondary | ICD-10-CM | POA: Diagnosis not present

## 2015-06-05 DIAGNOSIS — L97509 Non-pressure chronic ulcer of other part of unspecified foot with unspecified severity: Secondary | ICD-10-CM

## 2015-06-05 LAB — GLUCOSE, CAPILLARY
GLUCOSE-CAPILLARY: 166 mg/dL — AB (ref 65–99)
Glucose-Capillary: 192 mg/dL — ABNORMAL HIGH (ref 65–99)

## 2015-06-05 NOTE — Progress Notes (Addendum)
BRYLI, CEASAR ([id number]) Visit Report for 06/05/2015 HBO Details Patient Name: [name], [name] Date of Service: 06/05/2015 10:00 AM Medical Record Number: [id number] Patient Account Number: [id number] Date of Birth/Sex: 1955-12-23 (59 y.o. 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 HBO Treatment Details Treatment Number: 34 Patient Type: Outpatient Chamber Type: Monoplace Chamber #: HBO [id number] Treatment Protocol: 2.0 ATA with 90 minutes oxygen, and no air breaks Treatment Details Compression Rate Down: 1.5 psi / minute De-Compression Rate Up: 1.5 psi / minute Air breaks and breathing Compress Tx Pressure Decompress Decompress periods Begins Reached Begins Ends (leave unused spaces blank) Chamber Pressure 1 ATA 2.0 ATA - - - - - - 2.0 ATA 1 ATA Clock Time (24 hr) 09:49 09:59 - - - - - - 11:29 11:40 Treatment Length: 111 (minutes) Treatment Segments: 4 Capillary Blood Glucose Pre Capillary Blood Glucose (mg/dl): Post Capillary Blood Glucose (mg/dl): Vital Signs Capillary Blood Glucose Reference Range: 80 - 120 mg / dl HBO Diabetic Blood Glucose Intervention Range: <131 mg/dl or >249 mg/dl Time Vitals Blood Respiratory Capillary Blood Glucose Pulse Action Type: Pulse: Temperature: Taken: Pressure: Rate: Glucose (mg/dl): Meter #: Oximetry (%) Taken: Pre 09:25 144/63 84 18 97.8 166 1 none Post 11:45 116/62 84 18 97.7 192 1 none Treatment Response Treatment Completion Status: Treatment Completed without Adverse Event AILEA, STEAGALL. ([id number]) HBO Attestation I certify that I supervised this HBO  treatment in accordance with Medicare guidelines. A trained Yes emergency response team is readily available per hospital policies and procedures. Continue HBOT as ordered. Yes Electronic Signature(s) Signed: 06/05/2015 2:41:14 PM By: Judene Companion MD Previous Signature: 06/05/2015 10:30:41 AM Version By: Judene Companion MD Entered By: Judene Companion on 06/05/2015 14:41:13 [name] ([id number]) -------------------------------------------------------------------------------- HBO Safety Checklist Details Patient Name: [name] Date of Service: 06/05/2015 10:00 AM Medical Record Number: [id number] Patient Account Number: [id number] Date of Birth/Sex: 04-30-1956 (59 y.o. Female) Treating RN: Primary Care Physician: [name] Other Clinician: Referring Physician: [name] Treating Physician/Extender: Benjaman Pott in Treatment: 20 HBO Safety Checklist Items Safety Checklist Consent Form Signed Patient voided / foley secured and emptied When did you last eato 07:30 am Last dose of injectable or oral agent 08:30 am NA Ostomy pouch emptied and vented if applicable All implantable devices assessed, documented and approved ICD NA Intravenous access site secured and place Valuables secured Linens and cotton and cotton/polyester blend (less than 51% polyester) Personal oil-based products / skin lotions / body lotions removed NA Wigs or hairpieces removed Smoking or tobacco materials removed Books / newspapers / magazines / loose paper removed Cologne, aftershave, perfume and deodorant removed Jewelry removed (may wrap wedding band) Make-up removed Hair care products removed Battery operated devices (external) removed Heating patches and chemical warmers removed NA Titanium eyewear removed NA Nail polish cured greater than 10 hours NA Casting material cured greater than 10 hours NA Hearing aids removed NA Loose dentures or partials removed NA  Prosthetics have been removed Patient demonstrates correct use of air break device (if applicable) Patient concerns have been addressed Patient grounding bracelet on and cord attached to chamber Specifics for Inpatients (complete in addition to above) Medication sheet sent with patient Intravenous medications needed or due during therapy  sent with patient ELLAKATE, BAYLIFF ([id number]) Drainage tubes (e.g. nasogastric tube or chest tube secured and vented) Endotracheal or Tracheotomy tube secured Cuff deflated of air and inflated with saline Airway suctioned Electronic Signature(s) Signed: 06/05/2015 2:40:53 PM By: Judene Companion MD Previous Signature: 06/05/2015 10:39:03 AM Version By: Judene Companion MD Previous Signature: 06/05/2015 10:30:09 AM Version By: Judene Companion MD Entered By: Judene Companion on 06/05/2015 14:40:53

## 2015-06-05 NOTE — Progress Notes (Addendum)
SUSSAN, BOHNE ([id number]) Visit Report for 06/05/2015 Physician Orders Details Patient Name: [name], [name] Date of Service: 06/05/2015 10:00 AM Medical Record Number: [id number] Patient Account Number: [id number] Date of Birth/Sex: [birth year] (59 y.o. 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 Female) Treating RN: Primary Care Physician: [name] Other Clinician: Referring Physician: [name] Treating Physician/Extender: Benjaman Pott in Treatment: 20 Active Problems ICD-10 Encounter Code Description Active Date Diagnosis E11.621 Type 2 diabetes mellitus with foot ulcer 01/10/2015 Yes [id number] Chronic systolic (congestive) heart failure 01/10/2015 Yes E11.42 Type 2 diabetes mellitus with diabetic polyneuropathy 01/10/2015 Yes 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, left ankle and  foot 02/13/2015 Yes L97.512 Non-pressure chronic ulcer of other part of right foot with 03/10/2015 Yes fat layer exposed Inactive Problems Resolved Problems ICD-10 Code Description Active Date Resolved Date L97.422 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 01/10/2015 01/10/2015 layer exposed L97.522 Non-pressure chronic ulcer of other part of left foot with fat 01/10/2015 01/10/2015 layer exposed S00.33XA Contusion of nose, initial encounter 01/17/2015 01/17/2015 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, stage 3 03/03/2015 03/03/2015 [name], [name] ([id number]) [id number] Pressure ulcer of left buttock, stage 3 03/03/2015 03/03/2015 L02.611 Cutaneous abscess of right foot 04/20/2015 04/20/2015 Electronic Signature(s) Signed: 06/05/2015 2:42:06 PM By: Judene Companion MD Previous Signature: 06/05/2015 10:34:29 AM Version By: Judene Companion MD Previous Signature: 06/05/2015 10:33:32 AM Version By: Judene Companion MD Entered By: Judene Companion on 06/05/2015 14:42:05 [name] ([id number]) -------------------------------------------------------------------------------- SuperBill Details Patient Name: [name] Date of Service: 06/05/2015 Medical Record Number: [id number] Patient Account Number: [id number] Date of Birth/Sex: 08-31-1956 (59 y.o. 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 Facility Procedures CPT4 Code: [id number] Description: (Facility Use Only) HBOT, full body chamber,  48min Modifier: Quantity: 4 Physician Procedures CPT4 Code: [id number] Description: [id number] - WC PHYS LEVEL 1 EST PT ICD-10 Description Diagnosis E11.621 Type 2 diabetes mellitus with foot ulcer Modifier: Quantity: 1 CPT4 Code: [id number] Description: [id number] - WC PHYS HYPERBARIC OXYGEN THERAPY ICD-10 Description Diagnosis 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, left ankle and foo E11.621 Type 2 diabetes mellitus with foot ulcer Modifier: t Quantity: 1 Electronic Signature(s) Signed: 06/05/2015 2:41:39 PM By: Judene Companion MD Previous Signature: 06/05/2015 10:31:06 AM Version By: Judene Companion MD Entered By: Judene Companion on 06/05/2015 14:41:38

## 2015-06-05 NOTE — Progress Notes (Addendum)
[name], [name] ([id number]) Visit Report for 06/05/2015 Arrival Information Details Patient Name: [name], [name] Date of Service: 06/05/2015 10:00 AM Medical Record Number: [id number] Patient Account Number: [id number] Date of Birth/Sex: 1955/12/07 (59 y.o. Female) Treating RN: Primary Care Physician: [name] Other Clinician: Referring Physician: [name] Treating Physician/Extender: Benjaman Pott in Treatment: 20 Visit Information History Since Last Visit Added or deleted any medications: No Patient Arrived: Wheel Chair Any new allergies or adverse reactions: No Arrival Time: 09:20 Had a fall or experienced change in No Accompanied By: husband activities of daily living that may affect Transfer Assistance: Manual risk of falls: Patient Identification Verified: Yes Signs or symptoms of abuse/neglect No Secondary Verification Process Yes since last visito Completed: Hospitalized since last visit: No Patient Requires Transmission- No Has Dressing in Place as Prescribed: Yes Based Precautions: Has Footwear/Offloading in Place as Yes Patient Has Alerts: Yes Prescribed: Patient Alerts: Patient on Blood Left: Surgical Shoe with Thinner Pressure Relief ABI L:0.8, Insole R:0.93 Right: Surgical Shoe with Pressure Relief Insole Pain Present Now: No Electronic Signature(s) Signed: 06/05/2015 10:38:39 AM By: Judene Companion MD Entered By: Judene Companion on 06/05/2015 10:38:39 [name] ([id number]) -------------------------------------------------------------------------------- Encounter Discharge Information Details Patient Name: [name] Date of Service: 06/05/2015 10:00 AM Medical Record Number: [id number] Patient Account Number: [id number] Date of Birth/Sex: 11-28-1955 (59 y.o. Female) Treating RN: Primary Care Physician: [name] Other Clinician: Referring Physician: [name] Treating Physician/Extender: Benjaman Pott in Treatment: 20 Encounter Discharge Information Items Discharge Pain Level: 0 Discharge Condition: Stable Ambulatory Status: Wheelchair Discharge Destination: Home Private Transportation: Auto Accompanied By: husband Schedule Follow-up Appointment: No Medication Reconciliation completed and No provided to Patient/Care Adriena Manfre: Clinical Summary of Care: Notes Patient has an HBO treatment scheduled on 06/06/15 at 10:00 am. Electronic Signature(s) Signed: 06/05/2015 2:42:39 PM By: Judene Companion MD Previous Signature: 06/05/2015 10:35:16 AM Version By: Judene Companion MD Entered By: Judene Companion on 06/05/2015 14:42:38 [name] ([id number]) -------------------------------------------------------------------------------- Patient/Caregiver Education Details Patient Name: [name] Date of Service: 06/05/2015 10:00 AM Medical Record Number: [id number] Patient Account Number: [id number] Date of Birth/Gender: June 25, 1956 (59 y.o. Female) Treating RN: Primary Care Physician: [name] Other Clinician: Referring Physician: [name] Treating Physician/Extender: Benjaman Pott in Treatment: 20 Education Assessment Education Provided To: Patient Education Topics Provided Electronic Signature(s) Signed: 06/05/2015 2:42:32 PM By: Judene Companion MD Previous Signature: 06/05/2015 10:35:06 AM Version By: Judene Companion MD Entered By: Judene Companion on 06/05/2015 14:42:31 [name] ([id number]) -------------------------------------------------------------------------------- Vitals Details Patient Name: [name] Date of Service: 06/05/2015 10:00 AM Medical Record Number: [id number] Patient Account Number: [id number] Date of Birth/Sex: 24-Oct-1955 (59 y.o. Female) Treating RN: Primary Care Physician: [name] Other Clinician: Referring Physician: [name] Treating Physician/Extender: Benjaman Pott in Treatment: 20 Vital  Signs Time Taken: 09:25 Temperature (F): 97.8 Height (in): 68 Pulse (bpm): 84 Weight (lbs): 257 Respiratory Rate (breaths/min): 18 Body Mass Index (BMI): 39.1 Blood Pressure (mmHg): 144/63 Capillary Blood Glucose (mg/dl): 166 Reference Range: 80 - 120 mg / dl Electronic Signature(s) Signed: 06/05/2015 10:38:51 AM By: Judene Companion MD Entered By: Judene Companion on 06/05/2015 10:38:50

## 2015-06-06 ENCOUNTER — Encounter (HOSPITAL_BASED_OUTPATIENT_CLINIC_OR_DEPARTMENT_OTHER): Payer: PPO | Admitting: General Surgery

## 2015-06-06 DIAGNOSIS — E10621 Type 1 diabetes mellitus with foot ulcer: Secondary | ICD-10-CM

## 2015-06-06 DIAGNOSIS — L97509 Non-pressure chronic ulcer of other part of unspecified foot with unspecified severity: Secondary | ICD-10-CM

## 2015-06-06 DIAGNOSIS — L97512 Non-pressure chronic ulcer of other part of right foot with fat layer exposed: Secondary | ICD-10-CM | POA: Diagnosis not present

## 2015-06-06 LAB — GLUCOSE, CAPILLARY
Glucose-Capillary: 195 mg/dL — ABNORMAL HIGH (ref 65–99)
Glucose-Capillary: 205 mg/dL — ABNORMAL HIGH (ref 65–99)

## 2015-06-06 NOTE — Progress Notes (Signed)
TRENITI, DEDEAUX ([id number]) Visit Report for 06/06/2015 Arrival Information Details Patient Name: [name], [name] Date of Service: 06/06/2015 10:00 AM Medical Record Number: [id number] Patient Account Number: [id number] Date of Birth/Sex: Feb 07, 1956 (59 y.o. Female) Treating RN: Primary Care Physician: [name] Other Clinician: Referring Physician: [name] Treating Physician/Extender: Benjaman Pott in Treatment: 21 Visit Information History Since Last Visit Added or deleted any medications: No Patient Arrived: Wheel Chair Any new allergies or adverse reactions: No Arrival Time: 08:40 Had a fall or experienced change in No Accompanied By: [place] activities of daily living that may affect Transfer Assistance: Manual risk of falls: Patient Identification Verified: Yes Signs or symptoms of abuse/neglect No Secondary Verification Process Yes since last visito Completed: Hospitalized since last visit: No Patient Requires Transmission- No Has Dressing in Place as Prescribed: Yes Based Precautions: Has Footwear/Offloading in Place as Yes Patient Has Alerts: Yes Prescribed: Patient Alerts: Patient on Blood Left: Surgical Shoe with Thinner Pressure Relief ABI L:0.8, Insole R:0.93 Right: Surgical Shoe with Pressure Relief Insole Pain Present Now: No Electronic Signature(s) Signed: 06/06/2015 3:31:36 PM By: Lorine Bears RCP, RRT, CHT Entered By: Lorine Bears on 06/06/2015 09:03:47 [name] ([id number]) -------------------------------------------------------------------------------- Encounter Discharge Information Details Patient Name: [name] Date of Service: 06/06/2015 10:00 AM Medical Record Number: [id number] Patient Account Number: [id number] Date of Birth/Sex: 27-Jan-1956 (59 y.o. Female) Treating RN: Primary Care Physician: [name] Other Clinician: Referring Physician: [name] Treating Physician/Extender: Benjaman Pott in Treatment: 21 Encounter Discharge Information Items Discharge Pain Level: 0 Discharge Condition: Stable Ambulatory Status: Wheelchair Discharge Destination: Home Private Transportation: Auto Accompanied By: husband Schedule Follow-up Appointment: No Medication Reconciliation completed and No provided to Patient/Care Geet Hosking: Clinical Summary of Care: Notes Patient has an HBO treatment scheduled on 06/07/15 at 10:00 am. Electronic Signature(s) Signed: 06/06/2015 3:31:36 PM By: Lorine Bears RCP, RRT, CHT Entered By: Lorine Bears on 06/06/2015 11:20:21 [name] ([id number]) -------------------------------------------------------------------------------- Vitals Details Patient Name: [name] Date of Service: 06/06/2015 10:00 AM Medical Record Number: [id number] Patient Account Number: [id number] Date of Birth/Sex: Dec 09, 1955 (59 y.o. Female) Treating RN: Primary Care Physician: [name] Other Clinician: Referring Physician: [name] Treating Physician/Extender: Benjaman Pott in Treatment: 21 Vital Signs Time Taken: 08:42 Temperature (F): 97.9 Height (in): 68 Pulse (bpm): 72 Weight (lbs): 257 Respiratory Rate (breaths/min): 18 Body Mass Index (BMI): 39.1 Blood Pressure (mmHg): 104/64 Capillary Blood Glucose (mg/dl): 195 Reference Range: 80 - 120 mg / dl Electronic Signature(s) Signed: 06/06/2015 3:31:36 PM By: Lorine Bears RCP, RRT, CHT Entered By: Lorine Bears on 06/06/2015 09:04:25

## 2015-06-06 NOTE — Progress Notes (Signed)
See i heal 

## 2015-06-06 NOTE — Progress Notes (Signed)
EEVA, BRANDT ([id number]) Visit Report for 06/06/2015 HBO Details Patient Name: [name], [name] Date of Service: 06/06/2015 10:00 AM Medical Record Number: [id number] Patient Account Number: [id number] Date of Birth/Sex: [birth year] (59 y.o. 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 HBO Treatment Details Treatment Number: 35 Patient Type: Outpatient Chamber Type: Monoplace Chamber #: HBO [id number] Treatment Protocol: 2.0 ATA with 90 minutes oxygen, and no air breaks Treatment Details Compression Rate Down: 1.5 psi / minute De-Compression Rate Up: 1.5 psi / minute Air breaks and breathing Compress Tx Pressure Decompress Decompress periods Begins Reached Begins Ends (leave unused spaces blank) Chamber Pressure 1 ATA 2.0 ATA - - - - - - 2.0 ATA 1 ATA Clock Time (24 hr) 08:56 09:06 - - - - - - 10:36 10:49 Treatment Length: 113 (minutes) Treatment Segments: 4 Capillary Blood Glucose Pre Capillary Blood Glucose (mg/dl): Post Capillary Blood Glucose (mg/dl): Vital Signs Capillary Blood Glucose Reference Range: 80 - 120 mg / dl HBO Diabetic Blood Glucose Intervention Range: <131 mg/dl or >249 mg/dl Time Vitals Blood Respiratory Capillary Blood Glucose Pulse Action Type: Pulse: Temperature: Taken: Pressure: Rate: Glucose (mg/dl): Meter #: Oximetry (%) Taken: Pre 08:42 104/64 72 18 97.9 195 1 none Post 10:55 142/88 84 18 97.7 205 1 none Treatment Response Treatment Completion Status: Treatment Completed without Adverse Event NIDIA, FEATHER. ([id number]) HBO Attestation I certify that I supervised this HBO  treatment in accordance with Medicare guidelines. A trained Yes emergency response team is readily available per hospital policies and procedures. Continue HBOT as ordered. Yes Electronic Signature(s) Signed: 06/06/2015 12:08:08 PM By: Judene Companion MD Entered By: Judene Companion on 06/06/2015 12:08:07 [name] ([id number]) -------------------------------------------------------------------------------- HBO Safety Checklist Details Patient Name: [name] Date of Service: 06/06/2015 10:00 AM Medical Record Number: [id number] Patient Account Number: [id number] Date of Birth/Sex: 1955-12-01 (59 y.o. Female) Treating RN: Primary Care Physician: [name] Other Clinician: Referring Physician: [name] Treating Physician/Extender: Benjaman Pott in Treatment: 21 HBO Safety Checklist Items Safety Checklist Consent Form Signed Patient voided / foley secured and emptied When did you last eato 08:30 pm on 06/05/15 Last dose of injectable or oral agent 08:00 am NA Ostomy pouch emptied and vented if applicable All implantable devices assessed, documented and approved ICD NA Intravenous access site secured and place Valuables secured Linens and cotton and cotton/polyester blend (less than 51% polyester) Personal oil-based products / skin lotions / body lotions removed NA Wigs or hairpieces removed Smoking or tobacco materials removed Books / newspapers / magazines / loose paper removed Cologne, aftershave, perfume and deodorant removed Jewelry removed (may wrap wedding band) Make-up removed Hair care products removed Battery operated devices (external) removed Heating patches and chemical warmers removed NA Titanium eyewear removed NA Nail polish cured greater than 10 hours NA Casting material cured greater than 10 hours NA Hearing aids removed NA Loose dentures or partials removed NA Prosthetics have been removed Patient demonstrates correct use of air  break device (if applicable) Patient concerns have been addressed Patient grounding bracelet on and cord attached to chamber Specifics for Inpatients (complete in addition to above) Medication sheet sent with patient Intravenous medications needed or due during therapy sent with patient CAPUCINE, BUSENBARK ([id number]) Drainage  tubes (e.g. nasogastric tube or chest tube secured and vented) Endotracheal or Tracheotomy tube secured Cuff deflated of air and inflated with saline Airway suctioned Electronic Signature(s) Signed: 06/06/2015 12:07:48 PM By: Judene Companion MD Entered By: Judene Companion on 06/06/2015 12:07:47

## 2015-06-07 ENCOUNTER — Encounter: Payer: PPO | Admitting: Surgery

## 2015-06-07 DIAGNOSIS — L97512 Non-pressure chronic ulcer of other part of right foot with fat layer exposed: Secondary | ICD-10-CM | POA: Diagnosis not present

## 2015-06-07 LAB — GLUCOSE, CAPILLARY
GLUCOSE-CAPILLARY: 195 mg/dL — AB (ref 65–99)
Glucose-Capillary: 172 mg/dL — ABNORMAL HIGH (ref 65–99)

## 2015-06-07 NOTE — Progress Notes (Addendum)
[name], [name] ([id number]) Visit Report for 06/07/2015 HBO Details Patient Name: [name], [name] Date of Service: 06/07/2015 10:00 AM Medical Record Number: [id number] Patient Account Number: [id number] Date of Birth/Sex: [birth year] (59 y.o. 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 HBO Treatment Details Treatment Number: 36 Patient Type: Outpatient Chamber Type: Monoplace Chamber #: HBO [id number] Treatment Protocol: 2.0 ATA with 90 minutes oxygen, and no air breaks Treatment Details Compression Rate Down: 1.5 psi / minute De-Compression Rate Up: 1.5 psi / minute Air breaks and breathing Compress Tx Pressure Decompress Decompress periods Begins Reached Begins Ends (leave unused spaces blank) Chamber Pressure 1 ATA 2.0 ATA - - - - - - 2.0 ATA 1 ATA Clock Time (24 hr) 08:37 08:47 - - - - - - 10:17 10:27 Treatment Length: 110 (minutes) Treatment Segments: 4 Capillary Blood Glucose Pre Capillary Blood Glucose (mg/dl): Post Capillary Blood Glucose (mg/dl): Vital Signs Capillary Blood Glucose Reference Range: 80 - 120 mg / dl HBO Diabetic Blood Glucose Intervention Range: <131 mg/dl or >249 mg/dl Time Vitals Blood Respiratory Capillary Blood Glucose Pulse Action Type: Pulse: Temperature: Taken: Pressure: Rate: Glucose (mg/dl): Meter #: Oximetry (%) Taken: Pre 08:23 104/60 78 18 97.9 195 1 none Post 10:31 146/84 72 18 97.6 172 1 none Treatment Response Treatment Completion Status: Treatment Completed without Adverse Event AMOURA, MADSEN. ([id number]) HBO Attestation I certify that I supervised this  HBO treatment in accordance with Medicare guidelines. A trained Yes emergency response team is readily available per hospital policies and procedures. Continue HBOT as ordered. Yes Electronic Signature(s) Signed: 06/14/2015 8:07:08 AM By: Loletha Grayer MD Previous Signature: 06/07/2015 10:59:09 AM Version By: Loletha Grayer MD Previous Signature: 06/07/2015 12:05:34 PM Version By: Lorine Bears RCP, RRT, CHT Entered By: Loletha Grayer on 06/14/2015 08:04:19 [name] ([id number]) -------------------------------------------------------------------------------- HBO Safety Checklist Details Patient Name: [name] Date of Service: 06/07/2015 10:00 AM Medical Record Number: [id number] Patient Account Number: [id number] Date of Birth/Sex: 20-Jun-1956 (59 y.o. Female) Treating RN: Primary Care Physician: [name] Other Clinician: Referring Physician: [name] Treating Physician/Extender: BURNS III, Charlean Sanfilippo in Treatment: 21 HBO Safety Checklist Items Safety Checklist Consent Form Signed Patient voided / foley secured and emptied When did you last eato 07:30 pm on 07/07/15 Last dose of injectable or oral agent 08:00 am NA Ostomy pouch emptied and vented if applicable All implantable devices assessed, documented and approved ICD NA Intravenous access site secured and place Valuables secured Linens and cotton and cotton/polyester blend (less than 51% polyester) Personal oil-based products / skin lotions / body lotions removed NA Wigs or hairpieces removed Smoking or tobacco materials removed Books / newspapers / magazines / loose paper removed Cologne, aftershave, perfume and deodorant removed Jewelry removed (may wrap wedding band) Make-up removed Hair care products removed Battery operated devices (external) removed Heating patches and chemical warmers removed NA Titanium eyewear removed NA Nail polish cured greater than 10  hours NA Casting material cured greater than 10 hours NA Hearing aids removed NA Loose dentures or partials removed NA Prosthetics have been removed Patient demonstrates correct use of air break device (if applicable) Patient concerns have been addressed Patient grounding bracelet on and cord attached to chamber  Specifics for Inpatients (complete in addition to above) Medication sheet sent with patient Intravenous medications needed or due during therapy sent with patient JOLYNE, OXENDINE ([id number]) Drainage tubes (e.g. nasogastric tube or chest tube secured and vented) Endotracheal or Tracheotomy tube secured Cuff deflated of air and inflated with saline Airway suctioned Electronic Signature(s) Signed: 06/07/2015 12:05:34 PM By: Lorine Bears RCP, RRT, CHT Entered By: Lorine Bears on 06/07/2015 08:50:20

## 2015-06-07 NOTE — Progress Notes (Signed)
[name], [name] ([id number]) Visit Report for 06/07/2015 Arrival Information Details Patient Name: [name], [name] Date of Service: 06/07/2015 10:00 AM Medical Record Number: [id number] Patient Account Number: [id number] Date of Birth/Sex: 21-Jul-1956 (59 y.o. Female) Treating RN: Primary Care Physician: [name] Other Clinician: Referring Physician: [name] Treating Physician/Extender: BURNS III, Charlean Sanfilippo in Treatment: 21 Visit Information History Since Last Visit Added or deleted any medications: No Patient Arrived: Wheel Chair Any new allergies or adverse reactions: No Arrival Time: 08:18 Had a fall or experienced change in No Accompanied By: husband activities of daily living that may affect Transfer Assistance: Manual risk of falls: Patient Identification Verified: Yes Signs or symptoms of abuse/neglect No Secondary Verification Process Yes since last visito Completed: Hospitalized since last visit: No Patient Requires Transmission- No Has Dressing in Place as Prescribed: Yes Based Precautions: Has Footwear/Offloading in Place as Yes Patient Has Alerts: Yes Prescribed: Patient Alerts: Patient on Blood Left: Surgical Shoe with Thinner Pressure Relief ABI L:0.8, Insole R:0.93 Right: Surgical Shoe with Pressure Relief Insole Pain Present Now: No Electronic Signature(s) Signed: 06/07/2015 12:05:34 PM By: Lorine Bears RCP, RRT, CHT Entered By: Lorine Bears on 06/07/2015 08:48:39 [name] ([id number]) -------------------------------------------------------------------------------- Encounter Discharge Information Details Patient Name: [name] Date of Service: 06/07/2015 10:00 AM Medical Record Number: [id number] Patient Account Number: [id number] Date of Birth/Sex: March 24, 1956 (59 y.o. Female) Treating RN: Primary Care Physician: [name] Other Clinician: Referring Physician: [name] Treating Physician/Extender: BURNS III, Charlean Sanfilippo in Treatment: 21 Encounter Discharge Information Items Discharge Pain Level: 0 Discharge Condition: Stable Ambulatory Status: Wheelchair Discharge Destination: Home Private Transportation: Auto Accompanied By: husband Schedule Follow-up Appointment: No Medication Reconciliation completed and No provided to Patient/Care Eloni Darius: Clinical Summary of Care: Notes Patient has an HBO treatment scheduled on 06/08/15 at 10:00 am. Electronic Signature(s) Signed: 06/07/2015 12:05:34 PM By: Lorine Bears RCP, RRT, CHT Entered By: Lorine Bears on 06/07/2015 10:43:20 [name] ([id number]) -------------------------------------------------------------------------------- Vitals Details Patient Name: [name] Date of Service: 06/07/2015 10:00 AM Medical Record Number: [id number] Patient Account Number: [id number] Date of Birth/Sex: June 09, 1956 (59 y.o. Female) Treating RN: Primary Care Physician: [name] Other Clinician: Referring Physician: [name] Treating Physician/Extender: BURNS III, WALTER Weeks in Treatment: 21 Vital Signs Time Taken: 08:23 Temperature (F): 97.9 Height (in): 68 Pulse (bpm): 78 Weight (lbs): 257 Respiratory Rate (breaths/min): 18 Body Mass Index (BMI): 39.1 Blood Pressure (mmHg): 104/60 Capillary Blood Glucose (mg/dl): 195 Reference Range: 80 - 120 mg / dl Electronic Signature(s) Signed: 06/07/2015 12:05:34 PM By: Lorine Bears RCP, RRT, CHT Entered By: Lorine Bears on 06/07/2015 08:49:10

## 2015-06-08 ENCOUNTER — Encounter: Payer: PPO | Admitting: Surgery

## 2015-06-08 ENCOUNTER — Encounter: Payer: PPO | Attending: Surgery | Admitting: Surgery

## 2015-06-08 DIAGNOSIS — I5022 Chronic systolic (congestive) heart failure: Secondary | ICD-10-CM | POA: Diagnosis not present

## 2015-06-08 DIAGNOSIS — L97511 Non-pressure chronic ulcer of other part of right foot limited to breakdown of skin: Secondary | ICD-10-CM | POA: Diagnosis not present

## 2015-06-08 DIAGNOSIS — I10 Essential (primary) hypertension: Secondary | ICD-10-CM | POA: Diagnosis not present

## 2015-06-08 DIAGNOSIS — E1142 Type 2 diabetes mellitus with diabetic polyneuropathy: Secondary | ICD-10-CM | POA: Insufficient documentation

## 2015-06-08 DIAGNOSIS — M86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, left ankle and foot: Secondary | ICD-10-CM | POA: Diagnosis not present

## 2015-06-08 DIAGNOSIS — R0902 Hypoxemia: Secondary | ICD-10-CM | POA: Insufficient documentation

## 2015-06-08 DIAGNOSIS — N289 Disorder of kidney and ureter, unspecified: Secondary | ICD-10-CM | POA: Insufficient documentation

## 2015-06-08 DIAGNOSIS — L97512 Non-pressure chronic ulcer of other part of right foot with fat layer exposed: Secondary | ICD-10-CM | POA: Insufficient documentation

## 2015-06-08 DIAGNOSIS — E11621 Type 2 diabetes mellitus with foot ulcer: Secondary | ICD-10-CM | POA: Insufficient documentation

## 2015-06-08 DIAGNOSIS — G4733 Obstructive sleep apnea (adult) (pediatric): Secondary | ICD-10-CM | POA: Diagnosis not present

## 2015-06-08 DIAGNOSIS — E785 Hyperlipidemia, unspecified: Secondary | ICD-10-CM | POA: Diagnosis not present

## 2015-06-08 NOTE — Progress Notes (Signed)
NICHOLE, GIOIA ([id number]) Visit Report for 06/08/2015 Arrival Information Details Patient Name: [name], [name] Date of Service: 06/08/2015 10:00 AM Medical Record Number: [id number] Patient Account Number: [id number] Date of Birth/Sex: 1956-05-27 (59 y.o. Female) Treating RN: Primary Care Physician: [name] Other Clinician: Jacqulyn Bath Referring Physician: [name] Treating Physician/Extender: Frann Rider in Treatment: 21 Visit Information History Since Last Visit Added or deleted any medications: No Patient Arrived: Wheel Chair Any new allergies or adverse reactions: No Arrival Time: 09:35 Had a fall or experienced change in No Accompanied By: husband activities of daily living that may affect Transfer Assistance: Manual risk of falls: Patient Identification Verified: Yes Signs or symptoms of abuse/neglect No Secondary Verification Process Yes since last visito Completed: Hospitalized since last visit: No Patient Requires Transmission- No Has Dressing in Place as Prescribed: Yes Based Precautions: Has Footwear/Offloading in Place as Yes Patient Has Alerts: Yes Prescribed: Patient Alerts: Patient on Blood Left: Surgical Shoe with Thinner Pressure Relief ABI L:0.8, Insole R:0.93 Right: Surgical Shoe with Pressure Relief Insole Pain Present Now: No Electronic Signature(s) Signed: 06/08/2015 1:50:10 PM By: Lorine Bears RCP, RRT, CHT Entered By: Lorine Bears on 06/08/2015 09:36:26 [name] ([id number]) -------------------------------------------------------------------------------- Encounter Discharge Information Details Patient Name: [name] Date of Service: 06/08/2015 10:00 AM Medical Record Number: [id number] Patient Account Number: [id number] Date of Birth/Sex: [birth year] (59 y.o. Female) Treating RN: Primary Care Physician: [name] Other Clinician: Jacqulyn Bath Referring  Physician: [name] Treating Physician/Extender: Frann Rider in Treatment: 59 Encounter Discharge Information Items Discharge Pain Level: 0 Discharge Condition: Stable Ambulatory Status: Wheelchair Discharge Destination: Home Private Transportation: Auto Accompanied By: husband Schedule Follow-up Appointment: No Medication Reconciliation completed and No provided to Patient/Care Caylen Yardley: Clinical Summary of Care: Notes Patient has an HBO treatment scheduled on 06/09/15 at 10:00 am. Electronic Signature(s) Signed: 06/08/2015 1:50:10 PM By: Lorine Bears RCP, RRT, CHT Entered By: Lorine Bears on 06/08/2015 11:46:45 [name] ([id number]) -------------------------------------------------------------------------------- Vitals Details Patient Name: [name] Date of Service: 06/08/2015 10:00 AM Medical Record Number: [id number] Patient Account Number: [id number] Date of Birth/Sex: [birth year] (59 y.o. Female) Treating RN: Primary Care Physician: [name] Other Clinician: Jacqulyn Bath Referring Physician: [name] Treating Physician/Extender: Frann Rider in Treatment: 21 Vital Signs Time Taken: 08:58 Temperature (F): 97.6 Height (in): 68 Pulse (bpm): 76 Weight (lbs): 257 Respiratory Rate (breaths/min): 18 Body Mass Index (BMI): 39.1 Blood Pressure (mmHg): 98/60 Capillary Blood Glucose (mg/dl): 177 Reference Range: 80 - 120 mg / dl Electronic Signature(s) Signed: 06/08/2015 1:50:10 PM By: Lorine Bears RCP, RRT, CHT Entered By: Lorine Bears on 06/08/2015 09:36:50

## 2015-06-08 NOTE — Progress Notes (Signed)
to Primary Care Physician for further nutritional evaluation : 06/08/2015 Notes: Orientation to the Wound Care Program Nursing Diagnoses: Knowledge deficit related to the wound healing center program Goals: Patient/caregiver will verbalize understanding of the [place] Program Date Initiated: 01/10/2015 Goal Status: Active Interventions: Provide education on orientation to the wound center Notes: Osteomyelitis Nursing Diagnoses: Potential for  infection: osteomyelitis Goals: Diagnostic evaluation for osteomyelitis completed as ordered Date Initiated: 01/17/2015 Goal Status: Active Patient/caregiver will verbalize understanding of disease process and disease management Date Initiated: 01/17/2015 Goal Status: Active Signs and symptoms for osteomyelitis will be recognized and promptly addressed DARYL, DESLAURIERS ([id number]) Date Initiated: 01/17/2015 Goal Status: Active Interventions: Assess for signs and symptoms of osteomyelitis resolution every visit Provide education on osteomyelitis Treatment Activities: MRI : 06/08/2015 Test ordered outside of clinic : 06/08/2015 X-ray : 06/08/2015 Notes: Soft Tissue Infection Nursing Diagnoses: Knowledge deficit related to disease process and management Potential for infection: soft tissue Goals: Patient/caregiver will verbalize understanding of or measures to prevent infection and contamination in the home setting Date Initiated: 01/17/2015 Goal Status: Active Patient's soft tissue infection will resolve Date Initiated: 01/17/2015 Goal Status: Active Signs and symptoms of infection will be recognized early to allow for prompt treatment Date Initiated: 01/17/2015 Goal Status: Active Interventions: Assess signs and symptoms of infection every visit Provide education on infection Screen for HBO Treatment Activities: Culture : 06/08/2015 Culture and sensitivity : 06/08/2015 Education provided on Infection : 04/20/2015 Systemic antibiotics : 06/08/2015 Test ordered outside of clinic : 06/08/2015 Notes: ADILENNE, AHLES ([id number]) Wound/Skin Impairment Nursing Diagnoses: Impaired tissue integrity Knowledge deficit related to ulceration/compromised skin integrity Goals: Patient/caregiver will verbalize understanding of skin care regimen Date Initiated: 01/10/2015 Goal Status: Active Ulcer/skin breakdown will have a volume reduction of 30% by week 4 Date Initiated: 01/10/2015 Goal  Status: Active Ulcer/skin breakdown will have a volume reduction of 50% by week 8 Date Initiated: 01/10/2015 Goal Status: Active Ulcer/skin breakdown will have a volume reduction of 80% by week 12 Date Initiated: 01/10/2015 Goal Status: Active Ulcer/skin breakdown will heal within 14 weeks Date Initiated: 01/10/2015 Goal Status: Active Interventions: Assess patient/caregiver ability to obtain necessary supplies Assess patient/caregiver ability to perform ulcer/skin care regimen upon admission and as needed Assess ulceration(s) every visit Provide education on smoking Provide education on ulcer and skin care Treatment Activities: Consult for HBO : 06/08/2015 Skin care regimen initiated : 06/08/2015 Topical wound management initiated : 06/08/2015 Notes: Electronic Signature(s) Signed: 06/08/2015 9:06:21 AM By: Regan Lemming BSN, RN Entered By: Regan Lemming on 06/08/2015 09:06:21 [name] ([id number]) -------------------------------------------------------------------------------- Pain Assessment Details Patient Name: [name] Date of Service: 06/08/2015 8:45 AM Medical Record Number: [id number] Patient Account Number: [id number] Date of Birth/Sex: [birth year] (59 y.o. Female) Treating RN: Baruch Gouty, RN, BSN, Velva Harman Primary Care Physician: [name] Other Clinician: Referring Physician: [name] Treating Physician/Extender: Frann Rider in Treatment: 21 Active Problems Location of Pain Severity and Description of Pain Patient Has Paino No Site Locations Pain Management and Medication Current Pain Management: Electronic Signature(s) Signed: 06/08/2015 8:55:17 AM By: Regan Lemming BSN, RN Entered By: Regan Lemming on 06/08/2015 08:55:17 [name] ([id number]) -------------------------------------------------------------------------------- Patient/Caregiver Education Details Patient Name: [name] Date of Service: 06/08/2015 8:45 AM Medical Record  Number: [id number] Patient Account Number: [id number] Date of Birth/Gender: Aug 09, 1956 (59 y.o. Female) Treating RN: Afful, RN, BSN, Velva Harman Primary Care Physician: [name] Other Clinician: Referring Physician: [name] Treating Physician/Extender: Frann Rider in Treatment: 21 Education Assessment Education  1 Wound Margin: Distinct, outline attached Foul Odor After Cleansing: No Wound Bed Granulation Amount: Medium (34-66%) Exposed Structure Granulation Quality: Red Fascia Exposed: No Necrotic Amount: None Present (0%) Fat Layer Exposed: No Tendon Exposed: No Muscle Exposed: No Joint Exposed: No Bone Exposed: No MARTIN, UMPIERRE. ([id number]) Limited to Skin Breakdown Periwound Skin Texture Texture Color No Abnormalities Noted: No No Abnormalities Noted: No Callus: No Atrophie Blanche: No Crepitus: No Cyanosis: No Excoriation: No Ecchymosis: No Fluctuance: No Erythema: No Friable: No Hemosiderin Staining: No Induration: No Mottled: No Localized Edema: No Pallor: No Rash: No Rubor: No Scarring: No Temperature / Pain Moisture Temperature: No Abnormality No Abnormalities Noted: No Dry / Scaly: No Maceration:  No Moist: Yes Wound Preparation Ulcer Cleansing: Rinsed/Irrigated with Saline Topical Anesthetic Applied: Other: lidocaine 4%, Treatment Notes Wound #8 (Right, Dorsal Toe Third) 1. Cleansed with: Clean wound with Normal Saline 4. Dressing Applied: Prisma Ag 5. Secondary Dressing Applied Kerlix/Conform 6. Footwear/Offloading device applied Felt/Foam Other footwear/offloading device applied (specify in notes) 7. Secured with Tape Notes foam , darco with peg assist Electronic Signature(s) Signed: 06/08/2015 9:04:53 AM By: Regan Lemming BSN, RN Entered By: Regan Lemming on 06/08/2015 09:04:53 [name] ([id number]) -------------------------------------------------------------------------------- Vitals Details Patient Name: [name] Date of Service: 06/08/2015 8:45 AM Medical Record Number: [id number] Patient Account Number: [id number] Date of Birth/Sex: 11-03-1955 (59 y.o. Female) Treating RN: Afful, RN, BSN, [place] Primary Care Physician: [name] Other Clinician: Referring Physician: [name] Treating Physician/Extender: Frann Rider in Treatment: 21 Vital Signs Time Taken: 08:57 Temperature (F): 97.6 Height (in): 68 Pulse (bpm): 76 Weight (lbs): 257 Respiratory Rate (breaths/min): 18 Body Mass Index (BMI): 39.1 Blood Pressure (mmHg): 98/60 Reference Range: 80 - 120 mg / dl Electronic Signature(s) Signed: 06/08/2015 8:57:36 AM By: Regan Lemming BSN, RN Entered By: Regan Lemming on 06/08/2015 08:57:36  photographs - any number of wounds) 1 5 []  - Wound Tracing (instead of photographs) 0 []  - Simple Wound Measurement - one wound 0 X - Complex Wound Measurement - multiple wounds 2 5 INTERVENTIONS - Wound Dressings X - Small Wound Dressing one or multiple wounds 2 10 []  - Medium Wound Dressing one or multiple wounds 0 []  - Large Wound Dressing one or multiple wounds 0 []  - Application of Medications - topical 0 []  - Application of Medications - injection 0 INTERVENTIONS - Miscellaneous []  - External ear exam 0 SHEYENNE, LAMBERTUS ([id number]) []  - Specimen Collection (cultures, biopsies, blood, body fluids, etc.) 0 []  - Specimen(s) / Culture(s) sent or taken to Lab for analysis 0 []  - Patient Transfer (multiple staff / Harrel Lemon Lift / Similar devices) 0 []  - Simple Staple / Suture removal (25 or less) 0 []  - Complex Staple / Suture removal (26 or more) 0 []  - Hypo / Hyperglycemic Management (close monitor of Blood Glucose) 0 []  - Ankle / Brachial Index (ABI) - do not check if billed separately 0 X - Vital Signs 1 5 Has the patient been seen at the hospital within the last three years: Yes Total Score: 95 Level Of Care: New/Established - Level 3 Electronic Signature(s) Signed: 06/08/2015 9:36:51 AM By: Regan Lemming BSN, RN Entered By: Regan Lemming on 06/08/2015 09:36:49 [name] ([id number]) -------------------------------------------------------------------------------- Encounter Discharge Information Details Patient Name: [name] Date of Service: 06/08/2015 8:45 AM Medical Record Number: [id number] Patient Account Number: [id number] Date of Birth/Sex: March 19, 1956 (59 y.o. Female) Treating RN: Afful, RN, BSN, Velva Harman Primary Care Physician: [name] Other Clinician: Referring Physician: [name] Treating  Physician/Extender: Frann Rider in Treatment: 21 Encounter Discharge Information Items Discharge Pain Level: 0 Discharge Condition: Stable Ambulatory Status: Wheelchair Other (Note Discharge Destination: Required) Transportation: Other Accompanied By: spouse Schedule Follow-up Appointment: No Medication Reconciliation completed and provided to Patient/Care No Kamiyah Kindel: Patient Clinical Summary of Care: Declined Notes discharge to HBO for treatment Electronic Signature(s) Signed: 06/08/2015 11:05:09 AM By: Ruthine Dose Previous Signature: 06/08/2015 9:38:01 AM Version By: Regan Lemming BSN, RN Entered By: Ruthine Dose on 06/08/2015 11:05:09 [name] ([id number]) -------------------------------------------------------------------------------- Lower Extremity Assessment Details Patient Name: [name] Date of Service: 06/08/2015 8:45 AM Medical Record Number: [id number] Patient Account Number: [id number] Date of Birth/Sex: Jan 17, 1956 (59 y.o. Female) Treating RN: Afful, RN, BSN, Velva Harman Primary Care Physician: [name] Other Clinician: Referring Physician: [name] Treating Physician/Extender: Frann Rider in Treatment: 21 Vascular Assessment Pulses: Posterior Tibial Dorsalis Pedis Palpable: [Right:Yes] Extremity colors, hair growth, and conditions: Extremity Color: [Right:Normal] Hair Growth on Extremity: [Right:Yes] Temperature of Extremity: [Right:Warm] Capillary Refill: [Right:< 3 seconds] Toe Nail Assessment Left: Right: Thick: Yes Discolored: Yes Deformed: No Improper Length and Hygiene: No Electronic Signature(s) Signed: 06/08/2015 8:58:42 AM By: Regan Lemming BSN, RN Entered By: Regan Lemming on 06/08/2015 08:58:42 [name] ([id number]) -------------------------------------------------------------------------------- Multi Wound Chart Details Patient Name: [name] Date of Service: 06/08/2015 8:45  AM Medical Record Number: [id number] Patient Account Number: [id number] Date of Birth/Sex: 09/04/1956 (59 y.o. Female) Treating RN: Baruch Gouty, RN, BSN, Velva Harman Primary Care Physician: [name] Other Clinician: Referring Physician: [name] Treating Physician/Extender: Frann Rider in Treatment: 21 Vital Signs Height(in): 68 Pulse(bpm): 76 Weight(lbs): 257 Blood Pressure 98/60 (mmHg): Body Mass Index(BMI): 39 Temperature(F): 97.6 Respiratory Rate 18 (breaths/min): Photos: [7:No Photos] [8:No Photos] [N/A:N/A] Wound Location: [7:Right Toe Second] [8:Right Toe Third - Dorsal] [N/A:N/A] Wounding Event: [7:Gradually Appeared] [  photographs - any number of wounds) 1 5 []  - Wound Tracing (instead of photographs) 0 []  - Simple Wound Measurement - one wound 0 X - Complex Wound Measurement - multiple wounds 2 5 INTERVENTIONS - Wound Dressings X - Small Wound Dressing one or multiple wounds 2 10 []  - Medium Wound Dressing one or multiple wounds 0 []  - Large Wound Dressing one or multiple wounds 0 []  - Application of Medications - topical 0 []  - Application of Medications - injection 0 INTERVENTIONS - Miscellaneous []  - External ear exam 0 SHEYENNE, LAMBERTUS ([id number]) []  - Specimen Collection (cultures, biopsies, blood, body fluids, etc.) 0 []  - Specimen(s) / Culture(s) sent or taken to Lab for analysis 0 []  - Patient Transfer (multiple staff / Harrel Lemon Lift / Similar devices) 0 []  - Simple Staple / Suture removal (25 or less) 0 []  - Complex Staple / Suture removal (26 or more) 0 []  - Hypo / Hyperglycemic Management (close monitor of Blood Glucose) 0 []  - Ankle / Brachial Index (ABI) - do not check if billed separately 0 X - Vital Signs 1 5 Has the patient been seen at the hospital within the last three years: Yes Total Score: 95 Level Of Care: New/Established - Level 3 Electronic Signature(s) Signed: 06/08/2015 9:36:51 AM By: Regan Lemming BSN, RN Entered By: Regan Lemming on 06/08/2015 09:36:49 [name] ([id number]) -------------------------------------------------------------------------------- Encounter Discharge Information Details Patient Name: [name] Date of Service: 06/08/2015 8:45 AM Medical Record Number: [id number] Patient Account Number: [id number] Date of Birth/Sex: March 19, 1956 (59 y.o. Female) Treating RN: Afful, RN, BSN, Velva Harman Primary Care Physician: [name] Other Clinician: Referring Physician: [name] Treating  Physician/Extender: Frann Rider in Treatment: 21 Encounter Discharge Information Items Discharge Pain Level: 0 Discharge Condition: Stable Ambulatory Status: Wheelchair Other (Note Discharge Destination: Required) Transportation: Other Accompanied By: spouse Schedule Follow-up Appointment: No Medication Reconciliation completed and provided to Patient/Care No Kamiyah Kindel: Patient Clinical Summary of Care: Declined Notes discharge to HBO for treatment Electronic Signature(s) Signed: 06/08/2015 11:05:09 AM By: Ruthine Dose Previous Signature: 06/08/2015 9:38:01 AM Version By: Regan Lemming BSN, RN Entered By: Ruthine Dose on 06/08/2015 11:05:09 [name] ([id number]) -------------------------------------------------------------------------------- Lower Extremity Assessment Details Patient Name: [name] Date of Service: 06/08/2015 8:45 AM Medical Record Number: [id number] Patient Account Number: [id number] Date of Birth/Sex: Jan 17, 1956 (59 y.o. Female) Treating RN: Afful, RN, BSN, Velva Harman Primary Care Physician: [name] Other Clinician: Referring Physician: [name] Treating Physician/Extender: Frann Rider in Treatment: 21 Vascular Assessment Pulses: Posterior Tibial Dorsalis Pedis Palpable: [Right:Yes] Extremity colors, hair growth, and conditions: Extremity Color: [Right:Normal] Hair Growth on Extremity: [Right:Yes] Temperature of Extremity: [Right:Warm] Capillary Refill: [Right:< 3 seconds] Toe Nail Assessment Left: Right: Thick: Yes Discolored: Yes Deformed: No Improper Length and Hygiene: No Electronic Signature(s) Signed: 06/08/2015 8:58:42 AM By: Regan Lemming BSN, RN Entered By: Regan Lemming on 06/08/2015 08:58:42 [name] ([id number]) -------------------------------------------------------------------------------- Multi Wound Chart Details Patient Name: [name] Date of Service: 06/08/2015 8:45  AM Medical Record Number: [id number] Patient Account Number: [id number] Date of Birth/Sex: 09/04/1956 (59 y.o. 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] [8:Diabetic Wound/Ulcer of the Lower Extremity] [N/A:N/A] Comorbid History: [7:Anemia, Arrhythmia, Congestive Heart Failure, Hypertension, Type II Diabetes] [8:Anemia, Arrhythmia, Congestive Heart Failure, Hypertension, Type II Diabetes] [N/A:N/A] Date Acquired: [7:03/10/2015] [8:06/06/2015] [N/A:N/A] Weeks of Treatment: [7:12] [8:0] [N/A:N/A] Wound Status: [7:Open] [8:Open] [N/A:N/A] Measurements L x W x D 0.4x0.6x0.2 [8:0.5x0.7x0.1] [N/A:N/A] (cm) Area (cm) : [7:0.188] [8:0.275] [N/A:N/A] Volume (cm) : [7:0.038] [8:0.027] [N/A:N/A] % Reduction in Area: [7:77.00%] [8:0.00%] [N/A:N/A] % Reduction in Volume: 53.70% [8:0.00%] [N/A:N/A] Classification: [7:Grade 1] [8:Grade 1] [N/A:N/A] Exudate Amount: [7:Small] [8:N/A] [N/A:N/A] Exudate Type: [7:Serous] [8:N/A] [N/A:N/A] Exudate Color: [7:amber] [8:N/A] [N/A:N/A] Wound Margin: [7:Flat and Intact] [8:Distinct, outline attached] [N/A:N/A] Granulation Amount: [7:Medium (34-66%)] [8:Medium (34-66%)] [N/A:N/A] Granulation Quality: [7:Red, Pink] [8:Red] [N/A:N/A] Necrotic Amount: [7:None Present (0%)] [8:None Present (0%)] [N/A:N/A] Exposed Structures: [7:Fascia: No Fat: No Tendon: No Muscle: No] [8:Fascia:  No Fat: No Tendon: No Muscle: No] [N/A:N/A] Joint: No Joint: No Bone: No Bone: No Limited to Skin Limited to Skin Breakdown Breakdown Epithelialization: Small (1-33%) None N/A Periwound Skin Texture: Callus: Yes Edema: No N/A Edema: No Excoriation: No Excoriation: No Induration: No Induration: No Callus: No Crepitus: No Crepitus: No Fluctuance: No Fluctuance: No Friable: No Friable: No Rash: No Rash: No Scarring: No Scarring: No Periwound Skin Moist: Yes Moist: Yes N/A Moisture: Maceration: No Maceration: No Dry/Scaly: No Dry/Scaly: No Periwound Skin Color: Atrophie Blanche: No Atrophie Blanche: No N/A Cyanosis: No Cyanosis: No Ecchymosis: No Ecchymosis: No Erythema: No Erythema: No Hemosiderin Staining: No Hemosiderin Staining: No Mottled: No Mottled: No Pallor: No Pallor: No Rubor: No Rubor: No Temperature: No Abnormality No Abnormality N/A Tenderness on No No N/A Palpation: Wound Preparation: Ulcer Cleansing: Ulcer Cleansing: N/A Rinsed/Irrigated with Rinsed/Irrigated with Saline Saline Topical Anesthetic Topical Anesthetic Applied: Other: lidocaine Applied: Other: lidocaine 4% 4% Treatment Notes Electronic Signature(s) Signed: 06/08/2015 9:06:29 AM By: Regan Lemming BSN, RN Entered By: Regan Lemming on 06/08/2015 09:06:29 [name] ([id number]) -------------------------------------------------------------------------------- [place] Details Patient Name: [name] Date of Service: 06/08/2015 8:45 AM Medical Record Number: [id number] Patient Account Number: [id number] Date of Birth/Sex: June 29, 1956 (59 y.o. Female) Treating RN: Afful, RN, BSN, Velva Harman Primary Care Physician: [name] Other Clinician: Referring Physician: [name] Treating Physician/Extender: Frann Rider in Treatment: 21 Active Inactive Abuse / Safety / Falls / Self Care Management Nursing Diagnoses: Impaired physical  mobility Potential for falls Self care deficit: actual or potential Goals: Patient/caregiver will verbalize understanding of skin care regimen Date Initiated: 01/10/2015 Goal Status: Active Patient/caregiver will verbalize/demonstrate measures taken to improve the patient's personal safety Date Initiated: 01/10/2015 Goal Status: Active Patient/caregiver will verbalize/demonstrate measures taken to prevent injury and/or falls Date Initiated: 01/10/2015 Goal Status: Active Patient/caregiver will verbalize/demonstrate understanding of what to do in case of emergency Date Initiated: 01/10/2015 Goal Status: Active Interventions: Assess fall risk on admission and as needed Assess impairment of mobility on admission and as needed per policy Provide education on basic hygiene Provide education on fall prevention Provide education on personal and home safety Provide education on safe transfers Treatment Activities: Education provided on Basic Hygiene : 01/10/2015 Notes: Nutrition ENIA, VIVENZIO ([id number]) Nursing Diagnoses: Impaired glucose control: actual or potential Potential for alteratiion in Nutrition/Potential for imbalanced nutrition Goals: Patient/caregiver agrees to and verbalizes understanding of need to use nutritional supplements and/or vitamins as prescribed Date Initiated: 01/10/2015 Goal Status: Active Interventions: Assess HgA1c results as ordered upon admission and as needed Treatment Activities: Patient referred  Provided To: Patient Education Topics Provided Basic Hygiene: Methods: Explain/Verbal Responses: State content correctly Infection: Methods: Explain/Verbal Responses: State content correctly Safety: Methods: Explain/Verbal Responses: State content correctly Smoking and Wound Healing: Methods: Explain/Verbal Responses: State content correctly Welcome To [facility]: Methods: Explain/Verbal Responses: State content correctly Wound/Skin Impairment: Methods: Explain/Verbal Responses: State content correctly Electronic Signature(s) Signed: 06/08/2015 9:38:26 AM By: Regan Lemming BSN, RN Entered By: Regan Lemming on 06/08/2015 09:38:26 [name] ([id number]) -------------------------------------------------------------------------------- Wound Assessment Details Patient Name: [name] Date of Service: 06/08/2015 8:45 AM Medical Record Number: [id number] Patient Account Number: [id number] Date of Birth/Sex: 10/15/1955 (59 y.o. Female) Treating RN: Afful, RN, BSN, [place] Primary Care Physician: [name] Other Clinician: Referring Physician: [name] Treating Physician/Extender: Frann Rider in Treatment: 21 Wound Status Wound Number: 7 Primary Diabetic Wound/Ulcer of the Lower Etiology: Extremity Wound Location: Right Toe Second Wound Open Wounding Event: Gradually Appeared Status: Date Acquired: 03/10/2015 Comorbid Anemia, Arrhythmia, Congestive Heart Weeks Of Treatment: 12 History: Failure, Hypertension, Type II Diabetes Clustered Wound: No Photos Photo Uploaded By: Regan Lemming on 06/08/2015 17:17:15 Wound  Measurements Length: (cm) 0.4 Width: (cm) 0.6 Depth: (cm) 0.2 Area: (cm) 0.188 Volume: (cm) 0.038 % Reduction in Area: 77% % Reduction in Volume: 53.7% Epithelialization: Small (1-33%) Tunneling: No Undermining: No Wound Description Classification: Grade 1 Foul Odor Aft Wound Margin: Flat and Intact Exudate Amount: Small Exudate Type: Serous Exudate Color: amber er Cleansing: No Wound Bed Granulation Amount: Medium (34-66%) Exposed Structure Granulation Quality: Red, Pink Fascia Exposed: No Necrotic Amount: None Present (0%) Fat Layer Exposed: No Tendon Exposed: No SHAUNTAY, ADI. ([id number]) Muscle Exposed: No Joint Exposed: No Bone Exposed: No Limited to Skin Breakdown Periwound Skin Texture Texture Color No Abnormalities Noted: No No Abnormalities Noted: No Callus: Yes Atrophie Blanche: No Crepitus: No Cyanosis: No Excoriation: No Ecchymosis: No Fluctuance: No Erythema: No Friable: No Hemosiderin Staining: No Induration: No Mottled: No Localized Edema: No Pallor: No Rash: No Rubor: No Scarring: No Temperature / Pain Moisture Temperature: No Abnormality No Abnormalities Noted: No Dry / Scaly: No Maceration: No Moist: Yes Wound Preparation Ulcer Cleansing: Rinsed/Irrigated with Saline Topical Anesthetic Applied: Other: lidocaine 4%, Treatment Notes Wound #7 (Right Toe Second) 1. Cleansed with: Clean wound with Normal Saline 4. Dressing Applied: Prisma Ag 5. Secondary Dressing Applied Kerlix/Conform 6. Footwear/Offloading device applied Felt/Foam Other footwear/offloading device applied (specify in notes) 7. Secured with Tape Notes foam , darco with peg assist Electronic Signature(s) Signed: 06/08/2015 9:02:44 AM By: Regan Lemming BSN, RN Entered By: Regan Lemming on 06/08/2015 09:02:44 [name] ([name] ([id number]) -------------------------------------------------------------------------------- Wound  Assessment Details Patient Name: [name] Date of Service: 06/08/2015 8:45 AM Medical Record Number: [id number] Patient Account Number: [id number] Date of Birth/Sex: 1955/11/13 (59 y.o. Female) Treating RN: Afful, RN, BSN, [place] Primary Care Physician: [name] Other Clinician: Referring Physician: [name] Treating Physician/Extender: Frann Rider in Treatment: 21 Wound Status Wound Number: 8 Primary Diabetic Wound/Ulcer of the Lower Etiology: Extremity Wound Location: Right Toe Third - Dorsal Wound Open Wounding Event: Gradually Appeared Status: Date Acquired: 06/06/2015 Comorbid Anemia, Arrhythmia, Congestive Heart Weeks Of Treatment: 0 History: Failure, Hypertension, Type II Diabetes Clustered Wound: No Photos Photo Uploaded By: Regan Lemming on 06/08/2015 17:17:16 Wound Measurements Length: (cm) 0.5 Width: (cm) 0.7 Depth: (cm) 0.1 Area: (cm) 0.275 Volume: (cm) 0.027 % Reduction in Area: 0% % Reduction in Volume: 0% Epithelialization: None Tunneling: No Undermining: No Wound Description Classification: Grade

## 2015-06-08 NOTE — Progress Notes (Signed)
DIYORA, BRODBECK ([id number]) Visit Report for 06/08/2015 HBO Details Patient Name: [name], [name] Date of Service: 06/08/2015 10:00 AM Medical Record Number: [id number] Patient Account Number: [id number] Date of Birth/Sex: [birth year] (59 y.o. 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 HBO Treatment Details Treatment Number: 37 Patient Type: Outpatient Chamber Type: Monoplace Chamber #: HBO [id number] Treatment Protocol: 2.0 ATA with 90 minutes oxygen, and no air breaks Treatment Details Compression Rate Down: 1.5 psi / minute De-Compression Rate Up: 1.5 psi / minute Air breaks and breathing Compress Tx Pressure Decompress Decompress periods Begins Reached Begins Ends (leave unused spaces blank) Chamber Pressure 1 ATA 2.0 ATA - - - - - - 2.0 ATA 1 ATA Clock Time (24 hr) 09:46 09:56 - - - - - - 11:26 11:36 Treatment Length: 110 (minutes) Treatment Segments: 4 Capillary Blood Glucose Pre Capillary Blood Glucose (mg/dl): Post Capillary Blood Glucose (mg/dl): Vital Signs Capillary Blood Glucose Reference Range: 80 - 120 mg / dl HBO Diabetic Blood Glucose Intervention Range: <131 mg/dl or >249 mg/dl Time Vitals Blood Respiratory Capillary Blood Glucose Pulse Action Type: Pulse: Temperature: Taken: Pressure: Rate: Glucose (mg/dl): Meter #: Oximetry (%) Taken: Pre 08:58 98/60 76 18 97.6 177 1 none Post 11:40 122/68 78 18 97.6 153 1 none Treatment Response Treatment Completion Status: Treatment Completed without Adverse Event [name], [name]. ([id number]) HBO Attestation I certify that I  supervised this HBO treatment in accordance with Medicare guidelines. A trained Yes emergency response team is readily available per hospital policies and procedures. Continue HBOT as ordered. Yes Electronic Signature(s) Signed: 06/08/2015 12:22:52 PM By: Christin Fudge MD, FACS Entered By: Christin Fudge on 06/08/2015 12:22:51 [name] ([id number]) -------------------------------------------------------------------------------- HBO Safety Checklist Details Patient Name: [name] Date of Service: 06/08/2015 10:00 AM Medical Record Number: [id number] Patient Account Number: [id number] Date of Birth/Sex: October 30, 1955 (59 y.o. Female) Treating RN: Primary Care Physician: [name] Other Clinician: Jacqulyn Bath Referring Physician: [name] Treating Physician/Extender: Frann Rider in Treatment: 21 HBO Safety Checklist Items Safety Checklist Consent Form Signed Patient voided / foley secured and emptied When did you last eato 20:00 pm on 06/07/15 Last dose of injectable or oral agent 08:00 am NA Ostomy pouch emptied and vented if applicable All implantable devices assessed, documented and approved ICD NA Intravenous access site secured and place Valuables secured Linens and cotton and cotton/polyester blend (less than 51% polyester) Personal oil-based products / skin lotions / body lotions removed NA Wigs or hairpieces removed Smoking or tobacco materials removed Books / newspapers / magazines / loose paper removed Cologne, aftershave, perfume and deodorant removed Jewelry removed (may wrap wedding band) Make-up removed Hair care products removed Battery operated devices (external) removed Heating patches and chemical warmers removed NA Titanium eyewear removed NA Nail polish cured greater than 10 hours NA Casting material cured greater than 10 hours NA Hearing aids removed NA Loose dentures or partials removed NA Prosthetics have been  removed Patient demonstrates correct use of air break device (if applicable) Patient concerns have been addressed Patient grounding bracelet on and cord attached to chamber Specifics for Inpatients (complete in addition to above) Medication sheet sent with patient Intravenous medications needed or due during therapy sent with patient  KORTNE, ZODA ([id number]) Drainage tubes (e.g. nasogastric tube or chest tube secured and vented) Endotracheal or Tracheotomy tube secured Cuff deflated of air and inflated with saline Airway suctioned Electronic Signature(s) Signed: 06/08/2015 1:50:10 PM By: Lorine Bears RCP, RRT, CHT Entered By: Lorine Bears on 06/08/2015 09:37:42

## 2015-06-09 ENCOUNTER — Encounter: Payer: PPO | Admitting: Surgery

## 2015-06-09 DIAGNOSIS — L97512 Non-pressure chronic ulcer of other part of right foot with fat layer exposed: Secondary | ICD-10-CM | POA: Diagnosis not present

## 2015-06-09 LAB — GLUCOSE, CAPILLARY
GLUCOSE-CAPILLARY: 153 mg/dL — AB (ref 65–99)
GLUCOSE-CAPILLARY: 190 mg/dL — AB (ref 65–99)
GLUCOSE-CAPILLARY: 171 mg/dL — AB (ref 65–99)
Glucose-Capillary: 177 mg/dL — ABNORMAL HIGH (ref 65–99)

## 2015-06-09 NOTE — Progress Notes (Signed)
DIAMONDNIQUE, TIPPEN ([id number]) Visit Report for 06/09/2015 Arrival Information Details Patient Name: [name], [name] Date of Service: 06/09/2015 10:00 AM Medical Record Number: [id number] Patient Account Number: [id number] Date of Birth/Sex: Apr 05, 1956 (59 y.o. Female) Treating RN: Primary Care Physician: [name] Other Clinician: Referring Physician: [name] Treating Physician/Extender: Frann Rider in Treatment: 21 Visit Information History Since Last Visit Added or deleted any medications: No Patient Arrived: Wheel Chair Any new allergies or adverse reactions: No Arrival Time: 08:30 Had a fall or experienced change in No Accompanied By: husband activities of daily living that may affect Transfer Assistance: Manual risk of falls: Patient Identification Verified: Yes Signs or symptoms of abuse/neglect No Secondary Verification Process Yes since last visito Completed: Hospitalized since last visit: No Patient Requires Transmission- No Has Dressing in Place as Prescribed: Yes Based Precautions: Has Footwear/Offloading in Place as Yes Patient Has Alerts: Yes Prescribed: Patient Alerts: Patient on Blood Left: Surgical Shoe with Thinner Pressure Relief ABI L:0.8, Insole R:0.93 Right: Surgical Shoe with Pressure Relief Insole Pain Present Now: No Electronic Signature(s) Signed: 06/09/2015 11:23:30 AM By: Lorine Bears RCP, RRT, CHT Entered By: Lorine Bears on 06/09/2015 09:12:20 [name] ([id number]) -------------------------------------------------------------------------------- Encounter Discharge Information Details Patient Name: [name] Date of Service: 06/09/2015 10:00 AM Medical Record Number: [id number] Patient Account Number: [id number] Date of Birth/Sex: 1955/12/11 (59 y.o. Female) Treating RN: Primary Care Physician: [name] Other Clinician: Referring Physician: [name] Treating  Physician/Extender: Frann Rider in Treatment: 21 Encounter Discharge Information Items Discharge Pain Level: 0 Discharge Condition: Stable Ambulatory Status: Wheelchair Discharge Destination: Home Private Transportation: Auto Accompanied By: husband Schedule Follow-up Appointment: No Medication Reconciliation completed and No provided to Patient/Care Ariona Deschene: Clinical Summary of Care: Notes Patient has an HBO treatment scheduled on 06/13/15 at 10:00 am. Electronic Signature(s) Signed: 06/09/2015 11:23:30 AM By: Lorine Bears RCP, RRT, CHT Entered By: Lorine Bears on 06/09/2015 10:47:27 [name] ([id number]) -------------------------------------------------------------------------------- Vitals Details Patient Name: [name] Date of Service: 06/09/2015 10:00 AM Medical Record Number: [id number] Patient Account Number: [id number] Date of Birth/Sex: Nov 29, 1955 (59 y.o. Female) Treating RN: Primary Care Physician: [name] Other Clinician: Referring Physician: [name] Treating Physician/Extender: Frann Rider in Treatment: 21 Vital Signs Time Taken: 08:32 Temperature (F): 97.7 Height (in): 68 Pulse (bpm): 84 Weight (lbs): 257 Respiratory Rate (breaths/min): 18 Body Mass Index (BMI): 39.1 Blood Pressure (mmHg): 122/68 Capillary Blood Glucose (mg/dl): 190 Reference Range: 80 - 120 mg / dl Electronic Signature(s) Signed: 06/09/2015 11:23:30 AM By: Lorine Bears RCP, RRT, CHT Entered By: Lorine Bears on 06/09/2015 09:13:12

## 2015-06-09 NOTE — Progress Notes (Signed)
LETZY, OKON ([id number]) Visit Report for 06/08/2015 Chief Complaint Document Details Patient Name: [name], [name] Date of Service: 06/08/2015 8:45 AM Medical Record Number: [id number] Patient Account Number: [id number] Date of Birth/Sex: May 12, 1956 (59 y.o. Female) Treating RN: Afful, RN, BSN, Velva Harman Primary Care Physician: [name] Other Clinician: Referring Physician: [name] Treating Physician/Extender: Frann Rider in Treatment: 21 Information Obtained from: Patient Chief Complaint Patient presents to the wound care center for a consult due non healing wound 58 year old patient who comes with a history of an ulcer on the left second toe for at least about 4 months and a history on her left heel for about 3 weeks. I needed to get an MRI of her left foot but due to her defibrillator this could not be done and hence she is getting get a triple phase bone scan. 01/17/2015 -- she has developed a significant injury to the bridge of the nose where her CPAP machine was fitting. This is a painfull dark spot. Electronic Signature(s) Signed: 06/08/2015 9:29:47 AM By: Christin Fudge MD, FACS Entered By: Christin Fudge on 06/08/2015 09:29:47 [name] ([id number]) -------------------------------------------------------------------------------- HPI Details Patient Name: [name] Date of Service: 06/08/2015 8:45 AM Medical Record Number: [id number] Patient Account Number: [id number] Date of Birth/Sex: 1956/06/30 (59 y.o. Female) Treating RN: Afful, RN, BSN, Velva Harman Primary Care Physician: [name] Other Clinician: Referring Physician: [name] Treating Physician/Extender: Frann Rider in Treatment: 21 History of Present Illness HPI Description: 59 year old patient who is known to have diabetes for several years and generally has it under good control last hemoglobin A1c was 6.7 done 2 weeks ago. She has several other comorbidities including  acute CHF, hypoxia, hyperlipidemia, systolic heart failure, myocardial infarction, coronary artery disease, iron deficiency anemia, renal insufficiency, essential hypertension, morbid obesity and obstructive sleep apnea. 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 In the past she's had a coronary angioplasty with stent in 2013, laparoscopic cholecystectomy in 2011, bilateral oophorectomy in April 2012 and she's had some lumbar disc surgery in 1996. She says she's had a lot of peripheral neuropathy and due to this she's had a ulcer on her left second toe for a while. She had a dry eschar on her left heel and recently this was removed and she found that she had some infection and some drainage from there. She has minimal pain around the heel but no pain on any areas of her toes. she has not been on any antibiotics recently and has known vascular problems in the recent past. 01/17/2015 left foot x-ray done on April 5 reveals there are destructive changes noted of the distal aspect of the proximal phalanx of the left second digit with extension into the adjacent proximal interphalangeal joint space. These findings suggest osteomyelitis with possible septic arthritis. I was able to make a call to Dr. Nevada Crane the radiologist, and had a detailed discussion with him regarding this x-ray. He noted that there were also some changes in the fifth phalanx on the left foot and this could have been chronic osteomyelitis too. The second toe on the left leg shows findings which cannot differentiate between an acute or chronic osteomyelitis and hence he has recommended a MRI with and without contrast. We will get this organized for her and in the meanwhile treat her with oral antibiotics which is susceptible to Bactrim. She was seen yesterday by her PCP Dr. Chaya Jan who put her on 3 antibiotics which include  Levaquin and doxycycline and Bactrim. 01/24/2015 She  is scheduled for a triple PHASE bone scan on 01/26/2015. She is still on antibiotics and otherwise doing well. 01/31/2015 Bone Imaging 3 phase study -- IMPRESSION:1. There is increased uptake on all 3 phases localizing to the left hindfoot region. This may be a manifestation of osteomyelitis. Correlation with exact site of nonhealing ulcer. 2. There is increased uptake localizing to the second phalanx of the left foot on the blood pool and delayed phase images. 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 Underlying infection not excluded. I have personally discussed the x-ray studies with the radiologist Dr. Kerby Moors and after comparing all her films he agrees that the left second phalanx does not have any definite underlying infection and this is [name], [name]. ([id number]) now off a chronic arthropathy. There is a increased uptake in all 3 phases localized to the left hindfoot region and this is a manifestation of osteomyelitis. 02/07/2015 -- She was seen by the ID specialist Dr. Ola Spurr who reviewed her on my request recently. He has recommended a PICC line and IV vancomycin and oral ciprofloxacin for 4 weeks. She will be reviewed by him periodically. He also received several notes from Dr. Sonia Baller to help as documented all the requisites for hyperbaric oxygen therapy. 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 He also had a coronary angioplasty with stent placement in October 2013. Prior to that her 2-D echo showed EF of 35%. Her latest 2-D echo done on 11/01/2014 showed a LV EF of 50-55%. Systolic function and wall motion were within normal limits. Chest x-ray done in January 16, 2015 showed no acute findings. Last hemoglobin A1c done on 12/30/2014 was 6.7. CBC done on the same day was within normal limits, except for a hemoglobin being 8.7 and a hematocrit  between 29%. She had arterial duplex study done in August 2015 which showed: No evidence of segmental lower extremity arterial disease at rest, bilaterally. Normal ABI's, bilaterally. Normal great toe pressures, bilaterally. With all the above in place I believe she is a very good candidate for hyperbaric oxygen therapy and I would recommend this with the usual protocol for 6 weeks. she is going to get her PICC line this Friday and the IV antibiotics and when to start. She is also starting on oral ciprofloxacin. 03/03/2015 - recently hospitalized for anemia. No source identified. Started on amiodarone, contraindication to HBO. Reports left heel feels better. Main complaint is new buttocks ulcers. No fever or chills. Minimal drainage. 03/10/2015 -- There is a new ulceration noticed yesterday on her right second toe at the tip. She also has noticed some ulcerations on her gluteal region and we will need to take a look at this. As far as her medications go she does not know for sure what her dosage of amiodarone is exactly. She will look it up and let me know today. 03/17/2015 -- as far as her dosage of amiodarone she was taking : 02/25/15 - 03/02/15 400 mg per day; 03/03/15 - 03/10/15 200 mg per day. She stopped taking the medication on 03/10/15. She has not yet done the checks x-ray and her PFTs are still pending. Recent Lab work done on 03/13/2015 shows a glucose is 124, K+ was 3.3, her albumen was 2.9, WBC count was 3.4, hemoglobin was 9.3, hematocrit was 30.4 and platelets were 240. C-reactive protein was 33.7 which was  high and the Vancomycin trough was 18.6 which was high. 03/24/2015 -- he saw several doctors since I saw her last and they include her medical oncologist, cardiologist, neurologist and infectious disease doctor. Her PICC line is out and she now is on oral Cipro and doxycycline as per her verbal report. She has completed her pulmonary function test yesterday and a recent chest x-ray  is within normal limits. If the PFTs within normal limits she will be able to restart the hyperbaric oxygen therapy. 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 Underlying interstitial lung disease to be considered. Further recommendations were that of a CT chest and pulmonary consult if indicated. 05/11/2015 -- due to some discomfort in her ears during hyperbaric oxygen therapy, she saw ENT yesterday who basically said everything was good and continue with hyperbaric therapy. She has minimal redness of her right second toe but she is still on ciprofloxacin. [name], [name] ([id number]) 05/18/2015 -- her nose is almost completely healed and so is the sacral area. She still has an open wound on the right second toe which needs further care. He has just completed a course of ciprofloxacin. 06/08/2015 -- she developed a blister on her right third toe on the dorsum and this has opened out and has had skin breakdown. Electronic Signature(s) Signed: 06/08/2015 9:30:16 AM By: Christin Fudge MD, FACS Entered By: Christin Fudge on 06/08/2015 09:30:16 [name] ([id number]) -------------------------------------------------------------------------------- Physical Exam Details Patient Name: [name] Date of Service: 06/08/2015 8:45 AM Medical Record Number: [id number] Patient Account Number: [id number] Date of Birth/Sex: [birth year] (59 y.o. Female) Treating RN: Baruch Gouty, RN, BSN, Velva Harman Primary Care Physician: [name] Other Clinician: Referring Physician: [name] Treating Physician/Extender: Frann Rider in Treatment: 21 Constitutional . Pulse regular. Respirations normal and unlabored. Afebrile. . Eyes Nonicteric. Reactive to light. Ears, Nose, Mouth, and Throat Lips, teeth, and gums WNL.Marland Kitchen Moist mucosa without lesions . Neck supple and  nontender. No palpable supraclavicular or cervical adenopathy. Normal sized without goiter. Respiratory WNL. No retractions.. Cardiovascular Pedal Pulses WNL. No clubbing, cyanosis or edema. Lymphatic No adneopathy. No adenopathy. No adenopathy. Musculoskeletal Adexa without tenderness or enlargement.. Digits and nails w/o clubbing, cyanosis, infection, petechiae, ischemia, or inflammatory conditions.. Integumentary (Hair, Skin) No suspicious lesions. No crepitus or fluctuance. No peri-wound warmth or erythema. No masses.Marland Kitchen Psychiatric Judgement and insight Intact.. No evidence of depression, anxiety, or agitation.. Notes the ulcer on the plantar aspect of the right second toe is looking much better and there is good granulation tissue. She has a superficial blister on the right third toe dorsum which has just breakdown of skin. Electronic Signature(s) Signed: 06/08/2015 9:30:55 AM By: Christin Fudge MD, FACS Entered By: Christin Fudge on 06/08/2015 09:30:55 [name] ([id number]) -------------------------------------------------------------------------------- Physician Orders Details Patient Name: [name] Date of Service: 06/08/2015 8:45 AM Medical Record Number: [id number] Patient Account Number: [id number] Date of Birth/Sex: 1956/08/18 (59 y.o. Female) Treating RN: Afful, RN, BSN, Velva Harman Primary Care Physician: [name] Other Clinician: Referring Physician: [name] Treating Physician/Extender: Frann Rider in Treatment: 60 Verbal / Phone Orders: Yes Clinician: Afful, RN, BSN, Rita Read Back and Verified: Yes Diagnosis Coding Wound Cleansing Wound #7 Right Toe Second o Cleanse wound with mild soap and water Wound #8 Right,Dorsal Toe Third o Cleanse wound with mild soap and water Anesthetic Wound #7 Right Toe Second o Topical Lidocaine 4% cream applied  to wound bed prior to debridement Wound #8 Right,Dorsal Toe Third o Topical  Lidocaine 4% cream applied to wound bed prior to debridement Primary Wound Dressing Wound #7 Right Toe Second o Prisma Ag Wound #8 Right,Dorsal Toe Third o Prisma Ag Secondary Dressing Wound #7 Right Toe Second o Gauze and Kerlix/Conform Wound #8 Right,Dorsal Toe Third o Gauze and Kerlix/Conform Dressing Change Frequency Wound #7 Right Toe Second o Change dressing every other day. Wound #8 Right,Dorsal Toe Third o Change dressing every other day. Follow-up Appointments ALLSION, MCELVANY ([id number]) Wound #7 Right Toe Second o Return Appointment in 1 week. Wound #8 Right,Dorsal Toe Third o Return Appointment in 1 week. Off-Loading Wound #7 Right Toe Second o Open toe surgical shoe with peg assist. Wound #8 Right,Dorsal Toe Third o Open toe surgical shoe with peg assist. Additional Orders / Instructions Wound #7 Right Toe Second o Increase protein intake. o Activity as tolerated Home Health Wound #7 Right Toe Second o [place] Visits o Home Health Nurse may visit PRN to address patientos wound care needs. o FACE TO FACE ENCOUNTER: MEDICARE and MEDICAID PATIENTS: I certify that this patient is under my care and that I had a face-to-face encounter that meets the physician face-to-face encounter requirements with this patient on this date. The encounter with the patient was in whole or in part for the following MEDICAL CONDITION: (primary reason for [place]) MEDICAL NECESSITY: I certify, that based on my findings, NURSING services are a medically necessary home health service. HOME BOUND STATUS: I certify that my clinical findings support that this patient is homebound (i.e., Due to illness or injury, pt requires aid of supportive devices such as crutches, cane, wheelchairs, walkers, the use of special transportation or the assistance of another person to leave their place of residence. There is a normal inability to leave the  home and doing so requires considerable and taxing effort. Other absences are for medical reasons / religious services and are infrequent or of short duration when for other reasons). o If current dressing causes regression in wound condition, may D/C ordered dressing product/s and apply Normal Saline Moist Dressing daily until next [place] / Other MD appointment. [place] of regression in wound condition at [phone]. o Please direct any NON-WOUND related issues/requests for orders to patient's Primary Care Physician Wound #8 Right,Dorsal Toe [place] Visits o Home Health Nurse may visit PRN to address patientos wound care needs. o FACE TO FACE ENCOUNTER: MEDICARE and MEDICAID PATIENTS: I certify that this patient is under my care and that I had a face-to-face encounter that meets the physician face-to-face encounter requirements with this patient on this date. The encounter with the patient was in whole or in part for the following MEDICAL CONDITION: (primary reason for [place]) MEDICAL NECESSITY: I certify, that based on my findings, NURSING services are a medically [name], [name] ([id number]) necessary home health service. HOME BOUND STATUS: I certify that my clinical findings support that this patient is homebound (i.e., Due to illness or injury, pt requires aid of supportive devices such as crutches, cane, wheelchairs, walkers, the use of special transportation or the assistance of another person to leave their place of residence. There is a normal inability to leave the home and doing so requires considerable and taxing effort. Other absences are for medical reasons / religious services and are infrequent or of short duration when for other reasons). o If current dressing  causes regression in wound condition, may D/C ordered dressing product/s and apply Normal Saline Moist Dressing daily until next [place] / Other MD appointment. [place] of regression in wound condition at [phone]. o Please direct any NON-WOUND related issues/requests for orders to patient's Primary Care Physician Electronic Signature(s) Signed: 06/08/2015 9:27:16 AM By: Regan Lemming BSN, RN Signed: 06/08/2015 4:57:18 PM By: Christin Fudge MD, FACS Entered By: Regan Lemming on 06/08/2015 09:27:16 [name] ([id number]) -------------------------------------------------------------------------------- Problem List Details Patient Name: [name] Date of Service: 06/08/2015 8:45 AM Medical Record Number: [id number] Patient Account Number: [id number] Date of Birth/Sex: Dec 06, 1955 (59 y.o. 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, left ankle and foot 02/13/2015 Yes L97.512 Non-pressure chronic ulcer of other part of right foot with 03/10/2015 Yes fat layer exposed L97.511 Non-pressure chronic ulcer of other part of right foot 06/08/2015 Yes limited to breakdown of skin Inactive Problems Resolved Problems ICD-10 Code Description Active Date Resolved Date L97.422 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 01/10/2015 01/10/2015 layer exposed L97.522 Non-pressure chronic ulcer of other part of left foot with fat 01/10/2015 01/10/2015 layer exposed [name], [name] ([id number]) S00.33XA Contusion of nose, initial encounter 01/17/2015 01/17/2015 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, stage 3 03/03/2015  03/03/2015 L89.323 Pressure ulcer of left buttock, stage 3 03/03/2015 03/03/2015 L02.611 Cutaneous abscess of right foot 04/20/2015 04/20/2015 Electronic Signature(s) Signed: 06/08/2015 9:29:34 AM By: Christin Fudge MD, FACS Entered By: Christin Fudge on 06/08/2015 09:29:34 [name] ([id number]) -------------------------------------------------------------------------------- Progress Note Details Patient Name: [name] Date of Service: 06/08/2015 8:45 AM Medical Record Number: [id number] Patient Account Number: [id number] Date of Birth/Sex: 23-Sep-1956 (59 y.o. Female) Treating RN: Afful, RN, BSN, Velva Harman Primary Care Physician: [name] Other Clinician: Referring Physician: [name] Treating Physician/Extender: Frann Rider in Treatment: 21 Subjective Chief Complaint Information obtained from Patient Patient presents to the wound care center for a consult due non healing wound 59 year old patient who comes with a history of an ulcer on the left second toe for at least about 4 months and a history on her left heel for about 3 weeks. I needed to get an MRI of her left foot but due to her defibrillator this could not be done and hence she is getting get a triple phase bone scan. 01/17/2015 -- she has developed a significant injury to the bridge of the nose where her CPAP machine was fitting. This is a painfull dark spot. History of Present Illness (HPI) 59 year old patient who is known to have diabetes for several years and generally has it under good control last hemoglobin A1c was 6.7 done 2 weeks ago. She has several other comorbidities including acute CHF, hypoxia, hyperlipidemia, systolic heart failure, myocardial infarction, coronary artery disease, iron deficiency anemia, renal insufficiency, essential hypertension, morbid obesity and obstructive sleep apnea. 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 In the  past she's had a coronary angioplasty with stent in 2013, laparoscopic cholecystectomy in 2011, bilateral oophorectomy in April 2012 and she's had some lumbar disc surgery in 1996. She says she's had  a lot of peripheral neuropathy and due to this she's had a ulcer on her left second toe for a while. She had a dry eschar on her left heel and recently this was removed and she found that she had some infection and some drainage from there. She has minimal pain around the heel but no pain on any areas of her toes. she has not been on any antibiotics recently and has known vascular problems in the recent past. 01/17/2015 left foot x-ray done on April 5 reveals there are destructive changes noted of the distal aspect of the proximal phalanx of the left second digit with extension into the adjacent proximal interphalangeal joint space. These findings suggest osteomyelitis with possible septic arthritis. I was able to make a call to Dr. Nevada Crane the radiologist, and had a detailed discussion with him regarding this x-ray. He noted that there were also some changes in the fifth phalanx on the left foot and this could have been chronic osteomyelitis too. The second toe on the left leg shows findings which cannot differentiate between an acute or chronic osteomyelitis and hence he has recommended a MRI with and without contrast. We will get this organized for her and in the meanwhile treat her with oral antibiotics which is susceptible to Bactrim. She was seen yesterday by her PCP Dr. Chaya Jan who put her on 3 antibiotics which include Levaquin and doxycycline and Bactrim. 01/24/2015 She is scheduled for a triple PHASE bone scan on 01/26/2015. She is still on antibiotics and otherwise doing well. [name], [name] ([id number]) 01/31/2015 Bone Imaging 3 phase study -- IMPRESSION:1. There is increased uptake on all 3 phases localizing to the left hindfoot region. This may be a manifestation of  osteomyelitis. Correlation with exact site of nonhealing ulcer. 2. There is increased uptake localizing to the second phalanx of the left foot on the blood pool and delayed phase images. 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 Underlying infection not excluded. I have personally discussed the x-ray studies with the radiologist Dr. Kerby Moors and after comparing all her films he agrees that the left second phalanx does not have any definite underlying infection and this is now off a chronic arthropathy. There is a increased uptake in all 3 phases localized to the left hindfoot region and this is a manifestation of osteomyelitis. 02/07/2015 -- She was seen by the ID specialist Dr. Ola Spurr who reviewed her on my request recently. He has recommended a PICC line and IV vancomycin and oral ciprofloxacin for 4 weeks. She will be reviewed by him periodically. He also received several notes from Dr. Sonia Baller to help as documented all the requisites for hyperbaric oxygen therapy. 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 He also had a coronary angioplasty with stent placement in October 2013. Prior to that her 2-D echo showed EF of 35%. Her latest 2-D echo done on 11/01/2014 showed a LV EF of 50-55%. Systolic function and wall motion were within normal limits. Chest x-ray done in January 16, 2015 showed no acute findings. Last hemoglobin A1c done on 12/30/2014 was 6.7. CBC done on the same day was within normal limits, except for a hemoglobin being 8.7 and a hematocrit between 29%. She had arterial duplex study done in August 2015 which showed: No evidence of segmental lower extremity arterial disease at rest, bilaterally. Normal ABI's, bilaterally. Normal great toe pressures, bilaterally. With  all the above in place I believe she is a very good candidate for hyperbaric  oxygen therapy and I would recommend this with the usual protocol for 6 weeks. she is going to get her PICC line this Friday and the IV antibiotics and when to start. She is also starting on oral ciprofloxacin. 03/03/2015 - recently hospitalized for anemia. No source identified. Started on amiodarone, contraindication to HBO. Reports left heel feels better. Main complaint is new buttocks ulcers. No fever or chills. Minimal drainage. 03/10/2015 -- There is a new ulceration noticed yesterday on her right second toe at the tip. She also has noticed some ulcerations on her gluteal region and we will need to take a look at this. As far as her medications go she does not know for sure what her dosage of amiodarone is exactly. She will look it up and let me know today. 03/17/2015 -- as far as her dosage of amiodarone she was taking : 02/25/15 - 03/02/15 400 mg per day; 03/03/15 - 03/10/15 200 mg per day. She stopped taking the medication on 03/10/15. She has not yet done the checks x-ray and her PFTs are still pending. Recent Lab work done on 03/13/2015 shows a glucose is 124, K+ was 3.3, her albumen was 2.9, WBC count was 3.4, hemoglobin was 9.3, hematocrit was 30.4 and platelets were 240. C-reactive protein was 33.7 which was high and the Vancomycin trough was 18.6 which was high. 03/24/2015 -- he saw several doctors since I saw her last and they include her medical oncologist, cardiologist, neurologist and infectious disease doctor. Her PICC line is out and she now is on oral Cipro and doxycycline as per her verbal report. She has completed her pulmonary function test yesterday and a [name], [name]. ([id number]) recent chest x-ray is within normal limits. If the PFTs within normal limits she will be able to restart the hyperbaric oxygen therapy. 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 Underlying interstitial lung disease to be considered. Further recommendations were that of a CT chest and pulmonary consult if indicated. 05/11/2015 -- due to some discomfort in her ears during hyperbaric oxygen therapy, she saw ENT yesterday who basically said everything was good and continue with hyperbaric therapy. She has minimal redness of her right second toe but she is still on ciprofloxacin. 05/18/2015 -- her nose is almost completely healed and so is the sacral area. She still has an open wound on the right second toe which needs further care. He has just completed a course of ciprofloxacin. 06/08/2015 -- she developed a blister on her right third toe on the dorsum and this has opened out and has had skin breakdown. Objective Constitutional Pulse regular. Respirations normal and unlabored. Afebrile. Vitals Time Taken: 8:57 AM, Height: 68 in, Weight: 257 lbs, BMI: 39.1, Temperature: 97.6 F, Pulse: 76 bpm, Respiratory Rate: 18 breaths/min, Blood Pressure: 98/60 mmHg. Eyes Nonicteric. Reactive to light. Ears, Nose, Mouth, and Throat Lips, teeth, and gums WNL.Marland Kitchen Moist mucosa without lesions . Neck supple and nontender. No palpable supraclavicular or cervical adenopathy. Normal sized without goiter. Respiratory WNL. No retractions.. Cardiovascular Pedal Pulses WNL. No clubbing, cyanosis or edema. Lymphatic No adneopathy. No adenopathy. No adenopathy. Musculoskeletal Adexa without tenderness or enlargement.. Digits and nails w/o clubbing, cyanosis, infection, petechiae, [name], Vickie S. ([id number]) ischemia, or inflammatory conditions.Marland Kitchen Psychiatric Judgement and insight Intact.. No evidence of depression, anxiety, or  agitation.. General Notes: the ulcer on the plantar aspect of the right second toe is looking much better and there is good granulation tissue. She has a superficial blister on the right third toe dorsum which has just breakdown of  skin. Integumentary (Hair, Skin) No suspicious lesions. No crepitus or fluctuance. No peri-wound warmth or erythema. No masses.. Wound #7 status is Open. Original cause of wound was Gradually Appeared. The wound is located on the Right Toe Second. The wound measures 0.4cm length x 0.6cm width x 0.2cm depth; 0.188cm^2 area and 0.038cm^3 volume. The wound is limited to skin breakdown. There is no tunneling or undermining noted. There is a small amount of serous drainage noted. The wound margin is flat and intact. There is medium (34-66%) red, pink granulation within the wound bed. There is no necrotic tissue within the wound bed. The periwound skin appearance exhibited: Callus, Moist. The periwound skin appearance did not exhibit: Crepitus, Excoriation, Fluctuance, Friable, Induration, Localized Edema, Rash, Scarring, Dry/Scaly, Maceration, Atrophie Blanche, Cyanosis, Ecchymosis, Hemosiderin Staining, Mottled, Pallor, Rubor, Erythema. Periwound temperature was noted as No Abnormality. Wound #8 status is Open. Original cause of wound was Gradually Appeared. The wound is located on the Right,Dorsal Toe Third. The wound measures 0.5cm length x 0.7cm width x 0.1cm depth; 0.275cm^2 area and 0.027cm^3 volume. The wound is limited to skin breakdown. There is no tunneling or undermining noted. The wound margin is distinct with the outline attached to the wound base. There is medium (34-66%) red granulation within the wound bed. There is no necrotic tissue within the wound bed. The periwound skin appearance exhibited: Moist. The periwound skin appearance did not exhibit: Callus, Crepitus, Excoriation, Fluctuance, Friable, Induration, Localized Edema, Rash, Scarring, Dry/Scaly, Maceration, Atrophie Blanche, Cyanosis, Ecchymosis, Hemosiderin Staining, Mottled, Pallor, Rubor, Erythema. Periwound temperature was noted as No Abnormality. 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 ([id number]) We will keep the dressing changes the same with Prisma and off loading of the right second toe and I have also asked her to get some Prisma applied to the right third toe and make sure that this is not a rub against her Pegasys shoe. She is doing well with hyperbaric oxygen therapy and will continue the last several treatments as planned. Plan Wound Cleansing: Wound #7 Right Toe Second: Cleanse wound with mild soap and water Wound #8 Right,Dorsal Toe Third: Cleanse wound with mild soap and water Anesthetic: Wound #7 Right Toe Second: Topical Lidocaine 4% cream applied to wound bed prior to debridement Wound #8 Right,Dorsal Toe Third: Topical Lidocaine 4% cream applied to wound bed prior to debridement Primary Wound Dressing: Wound #7 Right Toe Second: Prisma Ag Wound #8 Right,Dorsal Toe Third: Prisma Ag Secondary Dressing: Wound #7 Right Toe Second: Gauze and Kerlix/Conform Wound #8 Right,Dorsal Toe Third: Gauze and Kerlix/Conform Dressing Change Frequency: Wound #7 Right Toe Second: Change dressing every other day. Wound #8 Right,Dorsal Toe Third: Change dressing every other day. Follow-up Appointments: Wound #7 Right Toe Second: Return Appointment in 1 week. Wound #8 Right,Dorsal Toe Third: Return Appointment in 1 week. Off-Loading: Wound #7 Right Toe Second: Open toe surgical  shoe with peg assist. Wound #8 Right,Dorsal Toe Third: Open toe surgical shoe with peg assist. Additional Orders / Instructions: Wound #7 Right Toe Second: Increase protein intake. Activity  as tolerated [name], [name] ([id number]) Home Health: Wound #7 Right Toe Second: [place] Nurse may visit PRN to address patient s wound care needs. FACE TO FACE ENCOUNTER: MEDICARE and MEDICAID PATIENTS: I certify that this patient is under my care and that I had a face-to-face encounter that meets the physician face-to-face encounter requirements with this patient on this date. The encounter with the patient was in whole or in part for the following MEDICAL CONDITION: (primary reason for [place]) MEDICAL NECESSITY: I certify, that based on my findings, NURSING services are a medically necessary home health service. HOME BOUND STATUS: I certify that my clinical findings support that this patient is homebound (i.e., Due to illness or injury, pt requires aid of supportive devices such as crutches, cane, wheelchairs, walkers, the use of special transportation or the assistance of another person to leave their place of residence. There is a normal inability to leave the home and doing so requires considerable and taxing effort. Other absences are for medical reasons / religious services and are infrequent or of short duration when for other reasons). If current dressing causes regression in wound condition, may D/C ordered dressing product/s and apply Normal Saline Moist Dressing daily until next [place] / Other MD appointment. [place] of regression in wound condition at [phone]. Please direct any NON-WOUND related issues/requests for orders to patient's Primary Care Physician Wound #8 Right,Dorsal Toe Third: [place] Nurse may visit PRN to address patient s wound care needs. FACE TO FACE ENCOUNTER: MEDICARE and MEDICAID PATIENTS: I certify that this patient is under my care and that I had a face-to-face encounter that meets the physician face-to-face encounter requirements with this  patient on this date. The encounter with the patient was in whole or in part for the following MEDICAL CONDITION: (primary reason for [place]) MEDICAL NECESSITY: I certify, that based on my findings, NURSING services are a medically necessary home health service. HOME BOUND STATUS: I certify that my clinical findings support that this patient is homebound (i.e., Due to illness or injury, pt requires aid of supportive devices such as crutches, cane, wheelchairs, walkers, the use of special transportation or the assistance of another person to leave their place of residence. There is a normal inability to leave the home and doing so requires considerable and taxing effort. Other absences are for medical reasons / religious services and are infrequent or of short duration when for other reasons). If current dressing causes regression in wound condition, may D/C ordered dressing product/s and apply Normal Saline Moist Dressing daily until next [place] / Other MD appointment. [place] of regression in wound condition at [phone]. Please direct any NON-WOUND related issues/requests for orders to patient's Primary Care Physician We will keep the dressing changes the same with Prisma and off loading of the right second toe and I have also asked her to get some Prisma applied to the right third toe and make sure that this is not a rub against her Pegasys shoe. She is doing well with hyperbaric oxygen therapy and will continue the last several treatments as planned. Electronic Signature(s) Signed: 06/08/2015 9:31:40 AM By: Christin Fudge MD, FACS [name] ([id number]) Entered By: Christin Fudge on 06/08/2015  09:31:40 [name], [name] ([id number]) -------------------------------------------------------------------------------- SuperBill Details Patient Name: [name] Date of Service: 06/08/2015 Medical Record Number: [id number] Patient  Account Number: [id number] Date of Birth/Sex: 08-14-1956 (59 y.o. 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, left ankle and foot L97.512 Non-pressure chronic ulcer of other part of right foot with fat layer exposed L97.511 Non-pressure chronic ulcer of other part of right foot limited to breakdown of skin Facility Procedures CPT4 Code: [id number] Description: 99213 - WOUND CARE VISIT-LEV 3 EST PT Modifier: Quantity: 1 Physician Procedures CPT4: Description Modifier Quantity Code [id number] - WC PHYS LEVEL 3 - EST PT 1 ICD-10 Description Diagnosis E11.621 Type 2 diabetes mellitus with foot ulcer L97.512 Non-pressure chronic ulcer of other part of right foot with fat layer exposed  L97.511 Non-pressure chronic ulcer of other part of right foot limited to breakdown of skin 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

## 2015-06-10 NOTE — Progress Notes (Signed)
[name], [name] ([id number]) Visit Report for 06/09/2015 HBO Details Patient Name: [name], [name] Date of Service: 06/09/2015 10:00 AM Medical Record Number: [id number] Patient Account Number: [id number] Date of Birth/Sex: August 24, 1956 (59 y.o. 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 HBO Treatment Details Treatment Number: 38 Patient Type: Outpatient Chamber Type: Monoplace Chamber #: HBO [id number] Treatment Protocol: 2.0 ATA with 90 minutes oxygen, and no air breaks Treatment Details Compression Rate Down: 1.5 psi / minute De-Compression Rate Up: 1.5 psi / minute Air breaks and breathing Compress Tx Pressure Decompress Decompress periods Begins Reached Begins Ends (leave unused spaces blank) Chamber Pressure 1 ATA 2.0 ATA - - - - - - 2.0 ATA 1 ATA Clock Time (24 hr) 08:46 08:56 - - - - - - 10:27 10:37 Treatment Length: 111 (minutes) Treatment Segments: 4 Capillary Blood Glucose Pre Capillary Blood Glucose (mg/dl): Post Capillary Blood Glucose (mg/dl): Vital Signs Capillary Blood Glucose Reference Range: 80 - 120 mg / dl HBO Diabetic Blood Glucose Intervention Range: <131 mg/dl or >249 mg/dl Time Vitals Blood Respiratory Capillary Blood Glucose Pulse Action Type: Pulse: Temperature: Taken: Pressure: Rate: Glucose (mg/dl): Meter #: Oximetry (%) Taken: Pre 08:32 122/68 84 18 97.7 190 1 none Post 10:44 144/78 78 18 171 1 none Treatment Response Treatment Completion Status: Treatment Completed without Adverse Event [name], [name] ([id number]) Electronic Signature(s) Signed: 06/09/2015 11:23:30 AM By:  Paulla Fore, RRT, CHT Signed: 06/09/2015 4:20:06 PM By: Christin Fudge MD, FACS Entered By: Lorine Bears on 06/09/2015 10:46:36 [name] ([id number]) -------------------------------------------------------------------------------- HBO Safety Checklist Details Patient Name: [name] Date of Service: 06/09/2015 10:00 AM Medical Record Number: [id number] Patient Account Number: [id number] Date of Birth/Sex: [birth year] (59 y.o. Female) Treating RN: Primary Care Physician: [name] Other Clinician: Referring Physician: [name] Treating Physician/Extender: Frann Rider in Treatment: 21 HBO Safety Checklist Items Safety Checklist Consent Form Signed Patient voided / foley secured and emptied When did you last eato 22:30 pm on 06/08/15 Last dose of injectable or oral agent 07:30 am NA Ostomy pouch emptied and vented if applicable All implantable devices assessed, documented and approved ICD NA Intravenous access site secured and place Valuables secured Linens and cotton and cotton/polyester blend (less than 51% polyester) Personal oil-based products / skin lotions / body lotions removed NA Wigs or hairpieces removed Smoking or tobacco materials removed Books / newspapers / magazines / loose paper removed Cologne, aftershave, perfume and deodorant removed Jewelry removed (may wrap wedding band) Make-up removed Hair care products removed Battery operated devices (external) removed Heating patches and chemical warmers removed NA Titanium eyewear removed NA Nail polish cured greater than 10 hours NA Casting material cured greater than 10 hours NA Hearing aids removed NA Loose dentures or partials removed NA Prosthetics have been removed Patient demonstrates correct use of air break device (if applicable) Patient concerns have been addressed Patient grounding bracelet on and cord attached to chamber Specifics for  Inpatients (complete in addition to above) Medication sheet sent with patient Intravenous medications needed or due during therapy sent with patient [name], [name] ([id number]) Drainage tubes (e.g. nasogastric tube or chest tube secured and vented) Endotracheal or Tracheotomy tube secured Cuff deflated of air and inflated with  saline Airway suctioned Electronic Signature(s) Signed: 06/09/2015 11:23:30 AM By: Lorine Bears RCP, RRT, CHT Entered By: Lorine Bears on 06/09/2015 09:17:44

## 2015-06-13 ENCOUNTER — Encounter: Payer: PPO | Admitting: Surgery

## 2015-06-13 DIAGNOSIS — L97512 Non-pressure chronic ulcer of other part of right foot with fat layer exposed: Secondary | ICD-10-CM | POA: Diagnosis not present

## 2015-06-13 LAB — GLUCOSE, CAPILLARY
GLUCOSE-CAPILLARY: 230 mg/dL — AB (ref 65–99)
Glucose-Capillary: 177 mg/dL — ABNORMAL HIGH (ref 65–99)

## 2015-06-13 NOTE — Progress Notes (Signed)
EMALYN, BOGACZ ([id number]) Visit Report for 06/13/2015 Arrival Information Details Patient Name: [name], [name] Date of Service: 06/13/2015 10:00 AM Medical Record Number: [id number] Patient Account Number: [id number] Date of Birth/Sex: [birth year] (59 y.o. Female) Treating RN: Primary Care Physician: [name] Other Clinician: Jacqulyn Bath Referring Physician: [name] Treating Physician/Extender: Frann Rider in Treatment: 39 Visit Information History Since Last Visit Added or deleted any medications: No Patient Arrived: Wheel Chair Any new allergies or adverse reactions: No Arrival Time: 09:30 Had a fall or experienced change in No Accompanied By: husband activities of daily living that may affect Transfer Assistance: Manual risk of falls: Patient Identification Verified: Yes Signs or symptoms of abuse/neglect No Secondary Verification Process Yes since last visito Completed: Hospitalized since last visit: No Patient Requires Transmission- No Has Dressing in Place as Prescribed: Yes Based Precautions: Has Footwear/Offloading in Place as Yes Patient Has Alerts: Yes Prescribed: Patient Alerts: Patient on Blood Left: Surgical Shoe with Thinner Pressure Relief ABI L:0.8, Insole R:0.93 Right: Surgical Shoe with Pressure Relief Insole Pain Present Now: No Electronic Signature(s) Signed: 06/13/2015 4:14:41 PM By: Lorine Bears RCP, RRT, CHT Entered By: Lorine Bears on 06/13/2015 09:47:31 [name] ([id number]) -------------------------------------------------------------------------------- Encounter Discharge Information Details Patient Name: [name] Date of Service: 06/13/2015 10:00 AM Medical Record Number: [id number] Patient Account Number: [id number] Date of Birth/Sex: 1956-04-04 (59 y.o. Female) Treating RN: Primary Care Physician: [name] Other Clinician: Jacqulyn Bath Referring  Physician: [name] Treating Physician/Extender: Frann Rider in Treatment: 22 Encounter Discharge Information Items Discharge Pain Level: 0 Discharge Condition: Stable Ambulatory Status: Wheelchair Discharge Destination: Home Private Transportation: Auto Accompanied By: husband Schedule Follow-up Appointment: No Medication Reconciliation completed and No provided to Patient/Care Anadelia Kintz: Clinical Summary of Care: Notes Patient has an HBO treatment scheduled on 06/14/15 at 10:00 am. Electronic Signature(s) Signed: 06/13/2015 4:14:41 PM By: Lorine Bears RCP, RRT, CHT Entered By: Lorine Bears on 06/13/2015 11:57:59 [name] ([id number]) -------------------------------------------------------------------------------- Vitals Details Patient Name: [name] Date of Service: 06/13/2015 10:00 AM Medical Record Number: [id number] Patient Account Number: [id number] Date of Birth/Sex: 11-Jul-1956 (59 y.o. Female) Treating RN: Primary Care Physician: [name] Other Clinician: Jacqulyn Bath Referring Physician: [name] Treating Physician/Extender: Frann Rider in Treatment: 22 Vital Signs Time Taken: 09:33 Temperature (F): 97.9 Height (in): 68 Pulse (bpm): 80 Weight (lbs): 257 Respiratory Rate (breaths/min): 18 Body Mass Index (BMI): 39.1 Blood Pressure (mmHg): 90/55 Capillary Blood Glucose (mg/dl): 230 Reference Range: 80 - 120 mg / dl Electronic Signature(s) Signed: 06/13/2015 4:14:41 PM By: Lorine Bears RCP, RRT, CHT Entered By: Lorine Bears on 06/13/2015 09:48:06

## 2015-06-13 NOTE — Progress Notes (Signed)
[name] ([id number]) Visit Report for 06/13/2015 HBO Details Patient Name: [name], [name] Date of Service: 06/13/2015 10:00 AM Medical Record Number: [id number] Patient Account Number: [id number] Date of Birth/Sex: 10/13/1955 (59 y.o. 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 HBO Treatment Details Treatment Number: 39 Patient Type: Outpatient Chamber Type: Monoplace Chamber #: HBO [id number] Treatment Protocol: 2.0 ATA with 90 minutes oxygen, and no air breaks Treatment Details Compression Rate Down: 1.5 psi / minute De-Compression Rate Up: 1.5 psi / minute Air breaks and breathing Compress Tx Pressure Decompress Decompress periods Begins Reached Begins Ends (leave unused spaces blank) Chamber Pressure 1 ATA 2.0 ATA - - - - - - 2.0 ATA 1 ATA Clock Time (24 hr) 09:46 09:56 - - - - - - 11:26 11:36 Treatment Length: 110 (minutes) Treatment Segments: 4 Capillary Blood Glucose Pre Capillary Blood Glucose (mg/dl): Post Capillary Blood Glucose (mg/dl): Vital Signs Capillary Blood Glucose Reference Range: 80 - 120 mg / dl HBO Diabetic Blood Glucose Intervention Range: <131 mg/dl or >249 mg/dl Time Vitals Blood Respiratory Capillary Blood Glucose Pulse Action Type: Pulse: Temperature: Taken: Pressure: Rate: Glucose (mg/dl): Meter #: Oximetry (%) Taken: Pre 09:33 90/55 80 18 97.9 230 1 none Post 11:41 118/66 78 18 97.6 177 1 none Treatment Response Treatment Completion Status: Treatment Completed without Adverse Event [name], [name] ([id number]) HBO Attestation I certify that I  supervised this HBO treatment in accordance with Medicare guidelines. A trained Yes emergency response team is readily available per hospital policies and procedures. Continue HBOT as ordered. Yes Electronic Signature(s) Signed: 06/13/2015 3:00:20 PM By: Christin Fudge MD, FACS Entered By: Christin Fudge on 06/13/2015 15:00:19 [name] ([id number]) -------------------------------------------------------------------------------- HBO Safety Checklist Details Patient Name: [name] Date of Service: 06/13/2015 10:00 AM Medical Record Number: [id number] Patient Account Number: [id number] Date of Birth/Sex: [birth year] (59 y.o. Female) Treating RN: Primary Care Physician: [name] Other Clinician: Jacqulyn Bath Referring Physician: [name] Treating Physician/Extender: Frann Rider in Treatment: 22 HBO Safety Checklist Items Safety Checklist Consent Form Signed Patient voided / foley secured and emptied When did you last eato 23:00 pm on 06/12/15 Last dose of injectable or oral agent 09:00 am NA Ostomy pouch emptied and vented if applicable All implantable devices assessed, documented and approved ICD NA Intravenous access site secured and place Valuables secured Linens and cotton and cotton/polyester blend (less than 51% polyester) Personal oil-based products / skin lotions / body lotions removed Wigs or hairpieces removed Smoking or tobacco materials removed Books / newspapers / magazines / loose paper removed Cologne, aftershave, perfume and deodorant removed Jewelry removed (may wrap wedding band) Make-up removed Hair care products removed Battery operated devices (external) removed Heating patches and chemical warmers removed NA Titanium eyewear removed NA Nail polish cured greater than 10 hours NA Casting material cured greater than 10 hours NA Hearing aids removed NA Loose dentures or partials removed NA Prosthetics have been  removed Patient demonstrates correct use of air break device (if applicable) Patient concerns have been addressed Patient grounding bracelet on and cord attached to chamber Specifics for Inpatients (complete in addition to above) Medication sheet sent with patient Intravenous medications needed or due during therapy sent with patient [name] ([id number]) Drainage tubes (e.g. nasogastric tube or chest tube secured and vented) Endotracheal or Tracheotomy tube secured Cuff deflated of air and inflated with saline Airway suctioned Electronic Signature(s) Signed: 06/13/2015 4:14:41 PM By: Lorine Bears RCP, RRT, CHT Entered By: Lorine Bears on 06/13/2015 09:49:05

## 2015-06-14 ENCOUNTER — Encounter: Payer: PPO | Admitting: Surgery

## 2015-06-14 DIAGNOSIS — L97512 Non-pressure chronic ulcer of other part of right foot with fat layer exposed: Secondary | ICD-10-CM | POA: Diagnosis not present

## 2015-06-14 LAB — GLUCOSE, CAPILLARY
GLUCOSE-CAPILLARY: 268 mg/dL — AB (ref 65–99)
Glucose-Capillary: 247 mg/dL — ABNORMAL HIGH (ref 65–99)

## 2015-06-15 ENCOUNTER — Ambulatory Visit
Admission: RE | Admit: 2015-06-15 | Discharge: 2015-06-15 | Disposition: A | Discharge status: Home / Self Care | Payer: PPO | Source: Ambulatory Visit | Attending: Surgery | Admitting: Surgery

## 2015-06-15 ENCOUNTER — Ambulatory Visit (INDEPENDENT_AMBULATORY_CARE_PROVIDER_SITE_OTHER): Payer: PPO | Admitting: Neurology

## 2015-06-15 ENCOUNTER — Encounter: Payer: Self-pay | Admitting: Neurology

## 2015-06-15 ENCOUNTER — Other Ambulatory Visit: Payer: Self-pay | Admitting: Surgery

## 2015-06-15 ENCOUNTER — Encounter: Payer: PPO | Admitting: Surgery

## 2015-06-15 VITALS — BP 130/68 | HR 90 | Ht 68.0 in | Wt 228.2 lb

## 2015-06-15 DIAGNOSIS — L539 Erythematous condition, unspecified: Secondary | ICD-10-CM

## 2015-06-15 DIAGNOSIS — S81801A Unspecified open wound, right lower leg, initial encounter: Secondary | ICD-10-CM

## 2015-06-15 DIAGNOSIS — X58XXXA Exposure to other specified factors, initial encounter: Secondary | ICD-10-CM | POA: Insufficient documentation

## 2015-06-15 DIAGNOSIS — L97512 Non-pressure chronic ulcer of other part of right foot with fat layer exposed: Secondary | ICD-10-CM | POA: Diagnosis not present

## 2015-06-15 DIAGNOSIS — L03115 Cellulitis of right lower limb: Secondary | ICD-10-CM | POA: Diagnosis not present

## 2015-06-15 DIAGNOSIS — R768 Other specified abnormal immunological findings in serum: Secondary | ICD-10-CM | POA: Diagnosis not present

## 2015-06-15 DIAGNOSIS — M24542 Contracture, left hand: Secondary | ICD-10-CM

## 2015-06-15 DIAGNOSIS — E0842 Diabetes mellitus due to underlying condition with diabetic polyneuropathy: Secondary | ICD-10-CM | POA: Diagnosis not present

## 2015-06-15 DIAGNOSIS — M869 Osteomyelitis, unspecified: Secondary | ICD-10-CM | POA: Diagnosis not present

## 2015-06-15 NOTE — Progress Notes (Signed)
KATELYNNE, FAIRBROTHER ([id number]) Visit Report for 06/14/2015 Arrival Information Details Patient Name: [name], [name] Date of Service: 06/14/2015 10:00 AM Medical Record Number: [id number] Patient Account Number: [id number] Date of Birth/Sex: [birth year] (59 y.o. Female) Treating RN: Primary Care Physician: [name] Other Clinician: Jacqulyn Bath Referring Physician: [name] Treating Physician/Extender: BURNS III, Charlean Sanfilippo in Treatment: 78 Visit Information History Since Last Visit Added or deleted any medications: No Patient Arrived: Wheel Chair Any new allergies or adverse reactions: No Arrival Time: 09:20 Had a fall or experienced change in No Accompanied By: husband activities of daily living that may affect Transfer Assistance: Manual risk of falls: Patient Identification Verified: Yes Signs or symptoms of abuse/neglect No Secondary Verification Process Yes since last visito Completed: Hospitalized since last visit: No Patient Requires Transmission- No Has Dressing in Place as Prescribed: Yes Based Precautions: Has Footwear/Offloading in Place as Yes Patient Has Alerts: Yes Prescribed: Patient Alerts: Patient on Blood Left: Surgical Shoe with Thinner Pressure Relief ABI L:0.8, Insole R:0.93 Right: Surgical Shoe with Pressure Relief Insole Pain Present Now: No Electronic Signature(s) Signed: 06/14/2015 4:06:40 PM By: Lorine Bears RCP, RRT, CHT Entered By: Lorine Bears on 06/14/2015 10:04:37 [name] ([id number]) -------------------------------------------------------------------------------- Encounter Discharge Information Details Patient Name: [name] Date of Service: 06/14/2015 10:00 AM Medical Record Number: [id number] Patient Account Number: [id number] Date of Birth/Sex: 1956-02-24 (59 y.o. Female) Treating RN: Primary Care Physician: [name] Other Clinician: Jacqulyn Bath Referring  Physician: [name] Treating Physician/Extender: BURNS III, Charlean Sanfilippo in Treatment: 22 Encounter Discharge Information Items Discharge Pain Level: 0 Discharge Condition: Stable Ambulatory Status: Wheelchair Discharge Destination: Home Private Transportation: Auto Accompanied By: husband Schedule Follow-up Appointment: No Medication Reconciliation completed and No provided to Patient/Care Chloeann Alfred: Clinical Summary of Care: Notes Patient has finished her course of 30 prescribed HBO treatments. Electronic Signature(s) Signed: 06/14/2015 4:06:40 PM By: Lorine Bears RCP, RRT, CHT Entered By: Lorine Bears on 06/14/2015 11:34:21 [name] ([id number]) -------------------------------------------------------------------------------- Vitals Details Patient Name: [name] Date of Service: 06/14/2015 10:00 AM Medical Record Number: [id number] Patient Account Number: [id number] Date of Birth/Sex: [birth year] (59 y.o. Female) Treating RN: Primary Care Physician: [name] Other Clinician: Jacqulyn Bath Referring Physician: [name] Treating Physician/Extender: BURNS III, Charlean Sanfilippo in Treatment: 22 Vital Signs Time Taken: 09:22 Temperature (F): 98.3 Height (in): 68 Pulse (bpm): 72 Weight (lbs): 257 Respiratory Rate (breaths/min): 18 Body Mass Index (BMI): 39.1 Blood Pressure (mmHg): 100/56 Capillary Blood Glucose (mg/dl): 268 Reference Range: 80 - 120 mg / dl Electronic Signature(s) Signed: 06/14/2015 4:06:40 PM By: Lorine Bears RCP, RRT, CHT Entered By: Lorine Bears on 06/14/2015 10:05:34

## 2015-06-15 NOTE — Patient Instructions (Addendum)
Please follow-up with Dr. Scharlene Gloss office for your recent test results with him regarding P-ANCA antibody.  Questions to ask include if this could cause inflammation of the blood vessels, which could contribute to foot ulcers, kidney problems and neuropathy.  Because of your diabetes, symptoms can easily be caused by this but it is important to determine if the antibody is another factor playing.  Continue your home exercises  Return to clinic in January 2017

## 2015-06-15 NOTE — Progress Notes (Signed)
Follow-up Visit   Date: 06/15/2015    ALISIA [name] MRN: [id number] DOB: 07-Nov-1955   Interim History: [name] is a 59 y.o. right-handed Caucasian female with diabetes mellitus type 2 since age of 68 (HbA1c 6.7) complicated by neuropathy, left leg diabetic amyotrophy (2011), and poor wound healing returning to the clinic for follow-up of peripheral neuropathy.  The patient was accompanied to the clinic by husband who also provides collateral information.    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 At that time, she was under the care of Dr. Sandria Manly at [facility], who has since retired. Hemoglobin A1c in 2011 was noted to be 15 and she had associated loss of 130 pounds in the preceding year. Slowly, she developed left knee buckling and leg weakness which progressed to where she had a fall and had to be hospitalized. She has had persistent numbness involving her left lower extremity. Imaging at that time showed multilevel spondylosis as well as postsurgical L4-5 and L5-S1 left hemilaminectomy and discectomy. Electrodiagnostic showed profound denervation involving the vastus medialis and iliopsoas muscles in addition to sensory abnormalities, suggesting diabetic amyotrophy. She underwent treatment with IVIG and was ultimately discharged to rehabilitation facility. Over the next 8 months, she continued to do rehabilitation and was eventually able to walk independently again, but still using a walker as needed.  Patient was reportedly doing well until February 2016 when she slowly started developing painless left foot drop and bilateral proximal leg weakness. Prior to February, she was walking independently, but now is dependent on a walk, and unable to make transfers independently. She has functionally dependent on her family members to transfer out of chairs, off the commode, as well as into  and out of a car. She is able to bathe and dress herself. He has not had any falls but is very cautious when walking. She endorses difficulty with climbing stairs and walking. She has residual numbness of the left leg.  In mid-April, she developed weakness of the left last two fingers, where she is unable to extend the 4th and 5th digits on the left. She has difficulty gripping things with her left hand. There is no associated numbness/tingling of the hand. Denies any neck or back pain.   UPDATE 06/15/2015:  Patient has underwent a battery of tests since her last visit including serum, CSF testing, and EMGs.  There was evidence of sensorimotor neuropathy as well as polyradiculoneuropathy.  CSF testing showed slightly elevated protein, otherwise was normal.  More notable was that her inflammatory markers, ANA, and p-ANCA returned elevated.  She saw rheumatology who ordered additional testing, but patient is unaware of these results and did not have a return visit.  She has been undergoing hyperbaric oxygen therapy for her wound which has helped left heel ulcer, but now she has right toe ulcer. In doing this, she was also started on insulin for close blood glucose control.   The strength of her legs has significantly improved and over the past month, she has been walking independently.  She has no new complaints today. Her left hand remains contracted which limits her ability to use it.  Medications:  Current Outpatient Prescriptions on File Prior to Visit  Medication Sig Dispense Refill  . aspirin EC 81 MG tablet Take 1 tablet (81 mg total) by mouth daily. 90 tablet 3  . carvedilol (COREG) 6.25 MG tablet Take 4 tablets (25  mg total) by mouth 2 (two) times daily with a meal. 60 tablet 0  . gabapentin (NEURONTIN) 600 MG tablet Take 1 tablet (600 mg total) by mouth 3 (three) times daily. 270 tablet 3  . Insulin Glargine (LANTUS) 100 UNIT/ML Solostar Pen Inject 10 Units into the skin daily at 10 pm. 15 mL  11  . isosorbide mononitrate (IMDUR) 60 MG 24 hr tablet Take 1 tablet (60 mg total) by mouth daily. 90 tablet 3  . nitroGLYCERIN (NITROSTAT) 0.4 MG SL tablet Place 1 tablet (0.4 mg total) under the tongue every 5 (five) minutes as needed for chest pain. 30 tablet 0  . nortriptyline (PAMELOR) 50 MG capsule TAKE 1 CAPSULE BY MOUTH EVERY NIGHT AT BEDTIME 30 capsule 2  . nystatin (MYCOSTATIN) 100000 UNIT/ML suspension Take 5 mLs (500,000 Units total) by mouth 4 (four) times daily. 200 mL 0  . pravastatin (PRAVACHOL) 40 MG tablet Take 1 tablet (40 mg total) by mouth at bedtime. 90 tablet 3  . sitaGLIPtin (JANUVIA) 100 MG tablet Take 1 tablet (100 mg total) by mouth daily. 90 tablet 0  . torsemide (DEMADEX) 20 MG tablet Take 2 tablets (40 mg total) by mouth daily. 60 tablet 3   No current facility-administered medications on file prior to visit.    Allergies: No Known Allergies  Review of Systems:  CONSTITUTIONAL: No fevers, chills, night sweats, or weight loss.  EYES: No visual changes or eye pain ENT: No hearing changes.  No history of nose bleeds.   RESPIRATORY: No cough, wheezing and shortness of breath.   CARDIOVASCULAR: Negative for chest pain, and palpitations.   GI: Negative for abdominal discomfort, blood in stools or black stools.  No recent change in bowel habits.   GU:  No history of incontinence.   MUSCLOSKELETAL: +history of joint pain or swelling.  No myalgias.   SKIN: Negative for lesions, rash, and itching.   ENDOCRINE: Negative for cold or heat intolerance, polydipsia or goiter.   PSYCH:  No depression or anxiety symptoms.   NEURO: As Above.   Vital Signs:  BP 130/68 mmHg  Pulse 90  Ht 5\' 8"  (1.727 m)  Wt 228 lb 4 oz (103.534 kg)  BMI 34.71 kg/m2  SpO2 99%  Neurological Exam: MENTAL STATUS including orientation to time, place, person, recent and remote memory, attention span and concentration, language, and fund of knowledge is normal.  Speech is not  dysarthric.  CRANIAL NERVES:  Pupils equal round and reactive to light.  Normal conjugate, extra-ocular eye movements in all directions of gaze.  No ptosis. Normal facial sensation.  Face is symmetric. Palate elevates symmetrically.  Tongue is midline.  Well healed nasal scar.   MOTOR:  No atrophy, fasciculations or abnormal movements.  No pronator drift.  Tone is normal.    Right Upper Extremity:    Left Upper Extremity:    Deltoid  5/5   Deltoid  5/5   Biceps  5/5   Biceps  5/5   Triceps  5/5   Triceps  5/5   Wrist extensors  5/5   Wrist extensors  5/5   Wrist flexors  5/5   Wrist flexors  5/5   Finger extensors  5/5   Finger extensors * Limited by flexion contracture of the 3-5th digits at the MCP and PIP 4/5   Finger flexors  5/5   Finger flexors  5/5   Dorsal interossei  5/5   Dorsal interossei  5/5   Abductor pollicis  5/5   Abductor pollicis  5/5   Tone (Ashworth scale)  0  Tone (Ashworth scale)  0   Right Lower Extremity:    Left Lower Extremity:    Hip flexors  5-/5   Hip flexors  5-/5   Hip extensors  5/5   Hip extensors  5/5   Knee flexors  5/5   Knee flexors  5/5   Knee extensors  5/5   Knee extensors  5/5   Dorsiflexors  5/5   Dorsiflexors  3/5   Plantarflexors  5/5   Plantarflexors  3/5   Toe extensors  5/5   Toe extensors  2/5   Toe flexors  5/5   Toe flexors  2/5   Tone (Ashworth scale)  0  Tone (Ashworth scale)  0    MSRs:  Reflexes are 1+/4 in the upper extremities and absent in the legs.   SENSORY: Vibration and temperature absent at ankles bilaterally, intact at knees (improved).   Sensation intact in the lower extremities.  COORDINATION/GAIT:  Normal finger-to- nose-finger.  Intact rapid alternating movements bilaterally. She is able to walk independently although there is left foot drop (old), otherwise looks stable, which is huge improvement.    Data: MRI lumbar spine 05/17/2010: 1. Unchanged MRI from 03/22/2010. No epidural abscess is identified. No  compression fracture or acute abnormality. 2. Postoperative changes on the left at L4-L5 and L5-S1. 3. Unchanged left paracentral and posterolateral shallow disc protrusion at L5-S1 potentially affecting the descending left S1 nerve root.  MRI brain wo contrast 05/17/2010: Small white matter lesions in the frontal lobes bilaterally. These are likely due to chronic microvascular ischemia. Demyelinating disease is not considered likely in this case. No acute abnormality.  EMG of the right lower extremity 03/28/2015: The electrophysiologic findings are most consistent with a severe generalized sensorimotor polyneuropathy, axon loss in type, affecting the right lower extremity. A superimposed multilevel intraspinal canal lesion (i.e. radiculopathy) cannot be excluded.  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 inflammatory neuropathy (elevated CRP and ESR, ANA and p-ANCA positivity)  - Labs have indicated well-controlled diabetes, but her inflammatory markers, ANA, and p-ANCA are markedly elevated raising the question of small vessel arteritis.  With her associated foot ulcers and kidney disease, I think it is reasonable to question whether her positive antibody titers for p-ANCA could represent autoimmune  arteritis.  It is easy to attribute neuropathy, poor wound healing, and acute kidney injury to medication and/or diabetes, but with the new findings of both p-ANCA and MPO antibody positivity, this is a question that needs to be answered.  I will ask her to follow-up with Dr. Guillermina [place] office for their recommendations.   At this juncture, she is doing very well from neurological stand point with improved strength and has better wound  healing of her nasal lesion, as well as left foot ulcer.  However, how she has right foot ulcer, CKD, and left finger flexion contracture which I cannot explain.  Steroids are not indicated from a neurological stand point in the setting of her neurological improvement.  2.  Diabetes mellitus, now on insulin; well-controlled  3.  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  Left finger flexion contracture  5.  Right foot ulceration, followed by wound clinic   The duration of this appointment visit was 40 minutes of face-to-face time with the patient.  Greater than 50% of this time was spent in counseling, explanation of diagnosis, planning of further management, and coordination of care.   Thank you for allowing me to participate in patient's care.  If I can answer any additional questions, I would be pleased to do so.    Sincerely,    Danyetta Gillham K. Allena Katz, DO

## 2015-06-15 NOTE — Progress Notes (Addendum)
high and the Vancomycin trough was 18.6 which was high. 03/24/2015 -- he saw several doctors since I saw her last and they include her medical oncologist, cardiologist, neurologist and infectious disease doctor. Her PICC line is out and she now is on oral Cipro and doxycycline as per her verbal report. She has completed her pulmonary function test yesterday and a recent chest x-ray  is within normal limits. If the PFTs within normal limits she will be able to restart the hyperbaric oxygen therapy. 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 Underlying interstitial lung disease to be considered. Further recommendations were that of a CT chest and pulmonary consult if indicated. 05/11/2015 -- due to some discomfort in her ears during hyperbaric oxygen therapy, she saw ENT yesterday who basically said everything was good and continue with hyperbaric therapy. She has minimal redness of her right second toe but she is still on ciprofloxacin. LARRISSA, REIFSCHNEIDER ([id number]) 05/18/2015 -- her nose is almost completely healed and so is the sacral area. She still has an open wound on the right second toe which needs further care. He has just completed a course of ciprofloxacin. 06/08/2015 -- she developed a blister on her right third toe on the dorsum and this has opened out and has had skin breakdown. 06/15/2015 -- When the dressing was taken down today, there was a redness noted of the second toe on the right side and mild cellulitis of the forefoot. No pus discharge or fever. Electronic Signature(s) Signed: 06/19/2015 3:43:40 PM By: Christin Fudge MD, FACS Previous Signature: 06/19/2015 3:34:35 PM Version By: Christin Fudge MD, FACS Previous Signature: 06/15/2015 9:18:38 AM Version By: Christin Fudge MD, FACS Entered By: Christin Fudge on 06/19/2015 15:43:40 [name] ([id number]) -------------------------------------------------------------------------------- Physical Exam Details Patient Name: [name] Date of Service: 06/15/2015 8:45 AM Medical Record Number: [id number] Patient Account Number: [id number] Date of Birth/Sex: Feb 07, 1956 (59 y.o. Female) Treating RN: Baruch Gouty, RN, BSN, Velva Harman Primary Care Physician: [name] Other  Clinician: Referring Physician: [name] Treating Physician/Extender: Frann Rider in Treatment: 22 Constitutional . Pulse regular. Respirations normal and unlabored. Afebrile. . Eyes Nonicteric. Reactive to light. Ears, Nose, Mouth, and Throat Lips, teeth, and gums WNL.Marland Kitchen Moist mucosa without lesions . Neck supple and nontender. No palpable supraclavicular or cervical adenopathy. Normal sized without goiter. Respiratory WNL. No retractions.. Cardiovascular Pedal Pulses WNL. No clubbing, cyanosis or edema. Lymphatic No adneopathy. No adenopathy. No adenopathy. Musculoskeletal Adexa without tenderness or enlargement.. Digits and nails w/o clubbing, cyanosis, infection, petechiae, ischemia, or inflammatory conditions.. Integumentary (Hair, Skin) No suspicious lesions. No crepitus or fluctuance. No peri-wound warmth or erythema. No masses.Marland Kitchen Psychiatric Judgement and insight Intact.. No evidence of depression, anxiety, or agitation.. Notes The wound on the right foot second toe is clean and there is no pus discharge on the plantar aspect of the wound. However there is cellulitis of the right second toe and the right forefoot and there is no evidence of any pocket of pus. Electronic Signature(s) Signed: 06/15/2015 9:19:18 AM By: Christin Fudge MD, FACS Entered By: Christin Fudge on 06/15/2015 09:19:18 [name] ([id number]) -------------------------------------------------------------------------------- Physician Orders Details Patient Name: [name] Date of Service: 06/15/2015 8:45 AM Medical Record Number: [id number] Patient Account Number: [id number] Date of Birth/Sex: 02/27/1956 (59 y.o. Female) Treating RN: Baruch Gouty, RN, BSN, Velva Harman Primary Care Physician: [name] Other Clinician: Referring Physician: [name] Treating Physician/Extender:  high and the Vancomycin trough was 18.6 which was high. 03/24/2015 -- he saw several doctors since I saw her last and they include her medical oncologist, cardiologist, neurologist and infectious disease doctor. Her PICC line is out and she now is on oral Cipro and doxycycline as per her verbal report. She has completed her pulmonary function test yesterday and a recent chest x-ray  is within normal limits. If the PFTs within normal limits she will be able to restart the hyperbaric oxygen therapy. 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 Underlying interstitial lung disease to be considered. Further recommendations were that of a CT chest and pulmonary consult if indicated. 05/11/2015 -- due to some discomfort in her ears during hyperbaric oxygen therapy, she saw ENT yesterday who basically said everything was good and continue with hyperbaric therapy. She has minimal redness of her right second toe but she is still on ciprofloxacin. LARRISSA, REIFSCHNEIDER ([id number]) 05/18/2015 -- her nose is almost completely healed and so is the sacral area. She still has an open wound on the right second toe which needs further care. He has just completed a course of ciprofloxacin. 06/08/2015 -- she developed a blister on her right third toe on the dorsum and this has opened out and has had skin breakdown. 06/15/2015 -- When the dressing was taken down today, there was a redness noted of the second toe on the right side and mild cellulitis of the forefoot. No pus discharge or fever. Electronic Signature(s) Signed: 06/19/2015 3:43:40 PM By: Christin Fudge MD, FACS Previous Signature: 06/19/2015 3:34:35 PM Version By: Christin Fudge MD, FACS Previous Signature: 06/15/2015 9:18:38 AM Version By: Christin Fudge MD, FACS Entered By: Christin Fudge on 06/19/2015 15:43:40 [name] ([id number]) -------------------------------------------------------------------------------- Physical Exam Details Patient Name: [name] Date of Service: 06/15/2015 8:45 AM Medical Record Number: [id number] Patient Account Number: [id number] Date of Birth/Sex: Feb 07, 1956 (59 y.o. Female) Treating RN: Baruch Gouty, RN, BSN, Velva Harman Primary Care Physician: [name] Other  Clinician: Referring Physician: [name] Treating Physician/Extender: Frann Rider in Treatment: 22 Constitutional . Pulse regular. Respirations normal and unlabored. Afebrile. . Eyes Nonicteric. Reactive to light. Ears, Nose, Mouth, and Throat Lips, teeth, and gums WNL.Marland Kitchen Moist mucosa without lesions . Neck supple and nontender. No palpable supraclavicular or cervical adenopathy. Normal sized without goiter. Respiratory WNL. No retractions.. Cardiovascular Pedal Pulses WNL. No clubbing, cyanosis or edema. Lymphatic No adneopathy. No adenopathy. No adenopathy. Musculoskeletal Adexa without tenderness or enlargement.. Digits and nails w/o clubbing, cyanosis, infection, petechiae, ischemia, or inflammatory conditions.. Integumentary (Hair, Skin) No suspicious lesions. No crepitus or fluctuance. No peri-wound warmth or erythema. No masses.Marland Kitchen Psychiatric Judgement and insight Intact.. No evidence of depression, anxiety, or agitation.. Notes The wound on the right foot second toe is clean and there is no pus discharge on the plantar aspect of the wound. However there is cellulitis of the right second toe and the right forefoot and there is no evidence of any pocket of pus. Electronic Signature(s) Signed: 06/15/2015 9:19:18 AM By: Christin Fudge MD, FACS Entered By: Christin Fudge on 06/15/2015 09:19:18 [name] ([id number]) -------------------------------------------------------------------------------- Physician Orders Details Patient Name: [name] Date of Service: 06/15/2015 8:45 AM Medical Record Number: [id number] Patient Account Number: [id number] Date of Birth/Sex: 02/27/1956 (59 y.o. Female) Treating RN: Baruch Gouty, RN, BSN, Velva Harman Primary Care Physician: [name] Other Clinician: Referring Physician: [name] Treating Physician/Extender:  high and the Vancomycin trough was 18.6 which was high. 03/24/2015 -- he saw several doctors since I saw her last and they include her medical oncologist, cardiologist, neurologist and infectious disease doctor. Her PICC line is out and she now is on oral Cipro and doxycycline as per her verbal report. She has completed her pulmonary function test yesterday and a recent chest x-ray  is within normal limits. If the PFTs within normal limits she will be able to restart the hyperbaric oxygen therapy. 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 Underlying interstitial lung disease to be considered. Further recommendations were that of a CT chest and pulmonary consult if indicated. 05/11/2015 -- due to some discomfort in her ears during hyperbaric oxygen therapy, she saw ENT yesterday who basically said everything was good and continue with hyperbaric therapy. She has minimal redness of her right second toe but she is still on ciprofloxacin. LARRISSA, REIFSCHNEIDER ([id number]) 05/18/2015 -- her nose is almost completely healed and so is the sacral area. She still has an open wound on the right second toe which needs further care. He has just completed a course of ciprofloxacin. 06/08/2015 -- she developed a blister on her right third toe on the dorsum and this has opened out and has had skin breakdown. 06/15/2015 -- When the dressing was taken down today, there was a redness noted of the second toe on the right side and mild cellulitis of the forefoot. No pus discharge or fever. Electronic Signature(s) Signed: 06/19/2015 3:43:40 PM By: Christin Fudge MD, FACS Previous Signature: 06/19/2015 3:34:35 PM Version By: Christin Fudge MD, FACS Previous Signature: 06/15/2015 9:18:38 AM Version By: Christin Fudge MD, FACS Entered By: Christin Fudge on 06/19/2015 15:43:40 [name] ([id number]) -------------------------------------------------------------------------------- Physical Exam Details Patient Name: [name] Date of Service: 06/15/2015 8:45 AM Medical Record Number: [id number] Patient Account Number: [id number] Date of Birth/Sex: Feb 07, 1956 (59 y.o. Female) Treating RN: Baruch Gouty, RN, BSN, Velva Harman Primary Care Physician: [name] Other  Clinician: Referring Physician: [name] Treating Physician/Extender: Frann Rider in Treatment: 22 Constitutional . Pulse regular. Respirations normal and unlabored. Afebrile. . Eyes Nonicteric. Reactive to light. Ears, Nose, Mouth, and Throat Lips, teeth, and gums WNL.Marland Kitchen Moist mucosa without lesions . Neck supple and nontender. No palpable supraclavicular or cervical adenopathy. Normal sized without goiter. Respiratory WNL. No retractions.. Cardiovascular Pedal Pulses WNL. No clubbing, cyanosis or edema. Lymphatic No adneopathy. No adenopathy. No adenopathy. Musculoskeletal Adexa without tenderness or enlargement.. Digits and nails w/o clubbing, cyanosis, infection, petechiae, ischemia, or inflammatory conditions.. Integumentary (Hair, Skin) No suspicious lesions. No crepitus or fluctuance. No peri-wound warmth or erythema. No masses.Marland Kitchen Psychiatric Judgement and insight Intact.. No evidence of depression, anxiety, or agitation.. Notes The wound on the right foot second toe is clean and there is no pus discharge on the plantar aspect of the wound. However there is cellulitis of the right second toe and the right forefoot and there is no evidence of any pocket of pus. Electronic Signature(s) Signed: 06/15/2015 9:19:18 AM By: Christin Fudge MD, FACS Entered By: Christin Fudge on 06/15/2015 09:19:18 [name] ([id number]) -------------------------------------------------------------------------------- Physician Orders Details Patient Name: [name] Date of Service: 06/15/2015 8:45 AM Medical Record Number: [id number] Patient Account Number: [id number] Date of Birth/Sex: 02/27/1956 (59 y.o. Female) Treating RN: Baruch Gouty, RN, BSN, Velva Harman Primary Care Physician: [name] Other Clinician: Referring Physician: [name] Treating Physician/Extender:  home and doing so requires considerable and taxing effort. Other absences are for medical reasons / religious services and are infrequent or of short duration when for other reasons). If current dressing causes regression in wound condition, may D/C ordered dressing product/s and apply Normal Saline Moist Dressing daily until next [place] / Other MD appointment. [place] of  regression in wound condition at [phone]. Please direct any NON-WOUND related issues/requests for orders to patient's Primary Care Physician Medications-please add to medication list.: Wound #7 Right Toe Second: P.O. Antibiotics - Doxycycline 100mg  PO BID x7 days Wound #8 Right,Dorsal Toe Third: P.O. Antibiotics - Doxycycline 100mg  PO BID x7 days Radiology ordered were: X-ray, foot - right foot The following medication(s) was prescribed: doxycycline hyclate oral 100 mg capsule capsule oral 1 bid starting 06/15/2015 TAMEAKA, CANNOY ([id number]) This 59 year old patient with a Wagner Grade 2 , DFU of the right foot has had osteomyelitis of the left ankle and foot, in the past and has just finished her treatment with hyperbaric oxygen therapy. today she was noted to have a cellulitis of the right second toe and forefoot and hence I have asked for a plain x-ray of the right foot. Will also empirically put her on doxycycline 100 mg twice a day for 14 days. She will continue with [employer] locally and off loading and we will keep a close eye on this. Electronic Signature(s) Signed: 06/19/2015 3:44:06 PM By: Christin Fudge MD, FACS Previous Signature: 06/15/2015 9:22:21 AM Version By: Christin Fudge MD, FACS Entered By: Christin Fudge on 06/19/2015 15:44:06 [name] ([id number]) -------------------------------------------------------------------------------- SuperBill Details Patient Name: [name] Date of Service: 06/15/2015 Medical Record Number: [id number] Patient Account Number: [id number] Date of Birth/Sex: April 26, 1956 (59 y.o. 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 L97.511 Non-pressure chronic ulcer of other part of right foot limited to breakdown of skin L03.115 Cellulitis of right lower limb Facility Procedures CPT4 Code: [id number] Description: 99213 - WOUND CARE VISIT-LEV 3 EST PT Modifier: Quantity: 1 Physician Procedures CPT4 Code Description: [id number] - WC PHYS LEVEL 3 - EST PT ICD-10 Description Diagnosis E11.621 Type 2 diabetes mellitus with foot ulcer L03.115 Cellulitis of right lower limb L97.512 Non-pressure chronic ulcer of other part of right fo Modifier: ot with fat lay Quantity: 1 er exposed Electronic Signature(s) Signed: 06/15/2015 9:22:39 AM By: Christin Fudge MD, FACS Entered By: Christin Fudge on 06/15/2015 09:22:39  high and the Vancomycin trough was 18.6 which was high. 03/24/2015 -- he saw several doctors since I saw her last and they include her medical oncologist, cardiologist, neurologist and infectious disease doctor. Her PICC line is out and she now is on oral Cipro and doxycycline as per her verbal report. She has completed her pulmonary function test yesterday and a recent chest x-ray  is within normal limits. If the PFTs within normal limits she will be able to restart the hyperbaric oxygen therapy. 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 Underlying interstitial lung disease to be considered. Further recommendations were that of a CT chest and pulmonary consult if indicated. 05/11/2015 -- due to some discomfort in her ears during hyperbaric oxygen therapy, she saw ENT yesterday who basically said everything was good and continue with hyperbaric therapy. She has minimal redness of her right second toe but she is still on ciprofloxacin. LARRISSA, REIFSCHNEIDER ([id number]) 05/18/2015 -- her nose is almost completely healed and so is the sacral area. She still has an open wound on the right second toe which needs further care. He has just completed a course of ciprofloxacin. 06/08/2015 -- she developed a blister on her right third toe on the dorsum and this has opened out and has had skin breakdown. 06/15/2015 -- When the dressing was taken down today, there was a redness noted of the second toe on the right side and mild cellulitis of the forefoot. No pus discharge or fever. Electronic Signature(s) Signed: 06/19/2015 3:43:40 PM By: Christin Fudge MD, FACS Previous Signature: 06/19/2015 3:34:35 PM Version By: Christin Fudge MD, FACS Previous Signature: 06/15/2015 9:18:38 AM Version By: Christin Fudge MD, FACS Entered By: Christin Fudge on 06/19/2015 15:43:40 [name] ([id number]) -------------------------------------------------------------------------------- Physical Exam Details Patient Name: [name] Date of Service: 06/15/2015 8:45 AM Medical Record Number: [id number] Patient Account Number: [id number] Date of Birth/Sex: Feb 07, 1956 (59 y.o. Female) Treating RN: Baruch Gouty, RN, BSN, Velva Harman Primary Care Physician: [name] Other  Clinician: Referring Physician: [name] Treating Physician/Extender: Frann Rider in Treatment: 22 Constitutional . Pulse regular. Respirations normal and unlabored. Afebrile. . Eyes Nonicteric. Reactive to light. Ears, Nose, Mouth, and Throat Lips, teeth, and gums WNL.Marland Kitchen Moist mucosa without lesions . Neck supple and nontender. No palpable supraclavicular or cervical adenopathy. Normal sized without goiter. Respiratory WNL. No retractions.. Cardiovascular Pedal Pulses WNL. No clubbing, cyanosis or edema. Lymphatic No adneopathy. No adenopathy. No adenopathy. Musculoskeletal Adexa without tenderness or enlargement.. Digits and nails w/o clubbing, cyanosis, infection, petechiae, ischemia, or inflammatory conditions.. Integumentary (Hair, Skin) No suspicious lesions. No crepitus or fluctuance. No peri-wound warmth or erythema. No masses.Marland Kitchen Psychiatric Judgement and insight Intact.. No evidence of depression, anxiety, or agitation.. Notes The wound on the right foot second toe is clean and there is no pus discharge on the plantar aspect of the wound. However there is cellulitis of the right second toe and the right forefoot and there is no evidence of any pocket of pus. Electronic Signature(s) Signed: 06/15/2015 9:19:18 AM By: Christin Fudge MD, FACS Entered By: Christin Fudge on 06/15/2015 09:19:18 [name] ([id number]) -------------------------------------------------------------------------------- Physician Orders Details Patient Name: [name] Date of Service: 06/15/2015 8:45 AM Medical Record Number: [id number] Patient Account Number: [id number] Date of Birth/Sex: 02/27/1956 (59 y.o. Female) Treating RN: Baruch Gouty, RN, BSN, Velva Harman Primary Care Physician: [name] Other Clinician: Referring Physician: [name] Treating Physician/Extender:  high and the Vancomycin trough was 18.6 which was high. 03/24/2015 -- he saw several doctors since I saw her last and they include her medical oncologist, cardiologist, neurologist and infectious disease doctor. Her PICC line is out and she now is on oral Cipro and doxycycline as per her verbal report. She has completed her pulmonary function test yesterday and a recent chest x-ray  is within normal limits. If the PFTs within normal limits she will be able to restart the hyperbaric oxygen therapy. 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 Underlying interstitial lung disease to be considered. Further recommendations were that of a CT chest and pulmonary consult if indicated. 05/11/2015 -- due to some discomfort in her ears during hyperbaric oxygen therapy, she saw ENT yesterday who basically said everything was good and continue with hyperbaric therapy. She has minimal redness of her right second toe but she is still on ciprofloxacin. LARRISSA, REIFSCHNEIDER ([id number]) 05/18/2015 -- her nose is almost completely healed and so is the sacral area. She still has an open wound on the right second toe which needs further care. He has just completed a course of ciprofloxacin. 06/08/2015 -- she developed a blister on her right third toe on the dorsum and this has opened out and has had skin breakdown. 06/15/2015 -- When the dressing was taken down today, there was a redness noted of the second toe on the right side and mild cellulitis of the forefoot. No pus discharge or fever. Electronic Signature(s) Signed: 06/19/2015 3:43:40 PM By: Christin Fudge MD, FACS Previous Signature: 06/19/2015 3:34:35 PM Version By: Christin Fudge MD, FACS Previous Signature: 06/15/2015 9:18:38 AM Version By: Christin Fudge MD, FACS Entered By: Christin Fudge on 06/19/2015 15:43:40 [name] ([id number]) -------------------------------------------------------------------------------- Physical Exam Details Patient Name: [name] Date of Service: 06/15/2015 8:45 AM Medical Record Number: [id number] Patient Account Number: [id number] Date of Birth/Sex: Feb 07, 1956 (59 y.o. Female) Treating RN: Baruch Gouty, RN, BSN, Velva Harman Primary Care Physician: [name] Other  Clinician: Referring Physician: [name] Treating Physician/Extender: Frann Rider in Treatment: 22 Constitutional . Pulse regular. Respirations normal and unlabored. Afebrile. . Eyes Nonicteric. Reactive to light. Ears, Nose, Mouth, and Throat Lips, teeth, and gums WNL.Marland Kitchen Moist mucosa without lesions . Neck supple and nontender. No palpable supraclavicular or cervical adenopathy. Normal sized without goiter. Respiratory WNL. No retractions.. Cardiovascular Pedal Pulses WNL. No clubbing, cyanosis or edema. Lymphatic No adneopathy. No adenopathy. No adenopathy. Musculoskeletal Adexa without tenderness or enlargement.. Digits and nails w/o clubbing, cyanosis, infection, petechiae, ischemia, or inflammatory conditions.. Integumentary (Hair, Skin) No suspicious lesions. No crepitus or fluctuance. No peri-wound warmth or erythema. No masses.Marland Kitchen Psychiatric Judgement and insight Intact.. No evidence of depression, anxiety, or agitation.. Notes The wound on the right foot second toe is clean and there is no pus discharge on the plantar aspect of the wound. However there is cellulitis of the right second toe and the right forefoot and there is no evidence of any pocket of pus. Electronic Signature(s) Signed: 06/15/2015 9:19:18 AM By: Christin Fudge MD, FACS Entered By: Christin Fudge on 06/15/2015 09:19:18 [name] ([id number]) -------------------------------------------------------------------------------- Physician Orders Details Patient Name: [name] Date of Service: 06/15/2015 8:45 AM Medical Record Number: [id number] Patient Account Number: [id number] Date of Birth/Sex: 02/27/1956 (59 y.o. Female) Treating RN: Baruch Gouty, RN, BSN, Velva Harman Primary Care Physician: [name] Other Clinician: Referring Physician: [name] Treating Physician/Extender:  home and doing so requires considerable and taxing effort. Other absences are for medical reasons / religious services and are infrequent or of short duration when for other reasons). If current dressing causes regression in wound condition, may D/C ordered dressing product/s and apply Normal Saline Moist Dressing daily until next [place] / Other MD appointment. [place] of  regression in wound condition at [phone]. Please direct any NON-WOUND related issues/requests for orders to patient's Primary Care Physician Medications-please add to medication list.: Wound #7 Right Toe Second: P.O. Antibiotics - Doxycycline 100mg  PO BID x7 days Wound #8 Right,Dorsal Toe Third: P.O. Antibiotics - Doxycycline 100mg  PO BID x7 days Radiology ordered were: X-ray, foot - right foot The following medication(s) was prescribed: doxycycline hyclate oral 100 mg capsule capsule oral 1 bid starting 06/15/2015 TAMEAKA, CANNOY ([id number]) This 59 year old patient with a Wagner Grade 2 , DFU of the right foot has had osteomyelitis of the left ankle and foot, in the past and has just finished her treatment with hyperbaric oxygen therapy. today she was noted to have a cellulitis of the right second toe and forefoot and hence I have asked for a plain x-ray of the right foot. Will also empirically put her on doxycycline 100 mg twice a day for 14 days. She will continue with [employer] locally and off loading and we will keep a close eye on this. Electronic Signature(s) Signed: 06/19/2015 3:44:06 PM By: Christin Fudge MD, FACS Previous Signature: 06/15/2015 9:22:21 AM Version By: Christin Fudge MD, FACS Entered By: Christin Fudge on 06/19/2015 15:44:06 [name] ([id number]) -------------------------------------------------------------------------------- SuperBill Details Patient Name: [name] Date of Service: 06/15/2015 Medical Record Number: [id number] Patient Account Number: [id number] Date of Birth/Sex: April 26, 1956 (59 y.o. 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 L97.511 Non-pressure chronic ulcer of other part of right foot limited to breakdown of skin L03.115 Cellulitis of right lower limb Facility Procedures CPT4 Code: [id number] Description: 99213 - WOUND CARE VISIT-LEV 3 EST PT Modifier: Quantity: 1 Physician Procedures CPT4 Code Description: [id number] - WC PHYS LEVEL 3 - EST PT ICD-10 Description Diagnosis E11.621 Type 2 diabetes mellitus with foot ulcer L03.115 Cellulitis of right lower limb L97.512 Non-pressure chronic ulcer of other part of right fo Modifier: ot with fat lay Quantity: 1 er exposed Electronic Signature(s) Signed: 06/15/2015 9:22:39 AM By: Christin Fudge MD, FACS Entered By: Christin Fudge on 06/15/2015 09:22:39  high and the Vancomycin trough was 18.6 which was high. 03/24/2015 -- he saw several doctors since I saw her last and they include her medical oncologist, cardiologist, neurologist and infectious disease doctor. Her PICC line is out and she now is on oral Cipro and doxycycline as per her verbal report. She has completed her pulmonary function test yesterday and a recent chest x-ray  is within normal limits. If the PFTs within normal limits she will be able to restart the hyperbaric oxygen therapy. 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 Underlying interstitial lung disease to be considered. Further recommendations were that of a CT chest and pulmonary consult if indicated. 05/11/2015 -- due to some discomfort in her ears during hyperbaric oxygen therapy, she saw ENT yesterday who basically said everything was good and continue with hyperbaric therapy. She has minimal redness of her right second toe but she is still on ciprofloxacin. LARRISSA, REIFSCHNEIDER ([id number]) 05/18/2015 -- her nose is almost completely healed and so is the sacral area. She still has an open wound on the right second toe which needs further care. He has just completed a course of ciprofloxacin. 06/08/2015 -- she developed a blister on her right third toe on the dorsum and this has opened out and has had skin breakdown. 06/15/2015 -- When the dressing was taken down today, there was a redness noted of the second toe on the right side and mild cellulitis of the forefoot. No pus discharge or fever. Electronic Signature(s) Signed: 06/19/2015 3:43:40 PM By: Christin Fudge MD, FACS Previous Signature: 06/19/2015 3:34:35 PM Version By: Christin Fudge MD, FACS Previous Signature: 06/15/2015 9:18:38 AM Version By: Christin Fudge MD, FACS Entered By: Christin Fudge on 06/19/2015 15:43:40 [name] ([id number]) -------------------------------------------------------------------------------- Physical Exam Details Patient Name: [name] Date of Service: 06/15/2015 8:45 AM Medical Record Number: [id number] Patient Account Number: [id number] Date of Birth/Sex: Feb 07, 1956 (59 y.o. Female) Treating RN: Baruch Gouty, RN, BSN, [place] Harman Primary Care Physician: [name] Other  Clinician: Referring Physician: [name] Treating Physician/Extender: Frann Rider in Treatment: 22 Constitutional . Pulse regular. Respirations normal and unlabored. Afebrile. . Eyes Nonicteric. Reactive to light. Ears, Nose, Mouth, and Throat Lips, teeth, and gums WNL.Marland Kitchen Moist mucosa without lesions . Neck supple and nontender. No palpable supraclavicular or cervical adenopathy. Normal sized without goiter. Respiratory WNL. No retractions.. Cardiovascular Pedal Pulses WNL. No clubbing, cyanosis or edema. Lymphatic No adneopathy. No adenopathy. No adenopathy. Musculoskeletal Adexa without tenderness or enlargement.. Digits and nails w/o clubbing, cyanosis, infection, petechiae, ischemia, or inflammatory conditions.. Integumentary (Hair, Skin) No suspicious lesions. No crepitus or fluctuance. No peri-wound warmth or erythema. No masses.Marland Kitchen Psychiatric Judgement and insight Intact.. No evidence of depression, anxiety, or agitation.. Notes The wound on the right foot second toe is clean and there is no pus discharge on the plantar aspect of the wound. However there is cellulitis of the right second toe and the right forefoot and there is no evidence of any pocket of pus. Electronic Signature(s) Signed: 06/15/2015 9:19:18 AM By: Christin Fudge MD, FACS Entered By: Christin Fudge on 06/15/2015 09:19:18 [name] ([id number]) -------------------------------------------------------------------------------- Physician Orders Details Patient Name: [name] Date of Service: 06/15/2015 8:45 AM Medical Record Number: [id number] Patient Account Number: [id number] Date of Birth/Sex: 02/27/1956 (59 y.o. Female) Treating RN: Baruch Gouty, RN, BSN, Velva Harman Primary Care Physician: [name] Other Clinician: Referring Physician: [name] Treating Physician/Extender:  high and the Vancomycin trough was 18.6 which was high. 03/24/2015 -- he saw several doctors since I saw her last and they include her medical oncologist, cardiologist, neurologist and infectious disease doctor. Her PICC line is out and she now is on oral Cipro and doxycycline as per her verbal report. She has completed her pulmonary function test yesterday and a recent chest x-ray  is within normal limits. If the PFTs within normal limits she will be able to restart the hyperbaric oxygen therapy. 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 Underlying interstitial lung disease to be considered. Further recommendations were that of a CT chest and pulmonary consult if indicated. 05/11/2015 -- due to some discomfort in her ears during hyperbaric oxygen therapy, she saw ENT yesterday who basically said everything was good and continue with hyperbaric therapy. She has minimal redness of her right second toe but she is still on ciprofloxacin. LARRISSA, REIFSCHNEIDER ([id number]) 05/18/2015 -- her nose is almost completely healed and so is the sacral area. She still has an open wound on the right second toe which needs further care. He has just completed a course of ciprofloxacin. 06/08/2015 -- she developed a blister on her right third toe on the dorsum and this has opened out and has had skin breakdown. 06/15/2015 -- When the dressing was taken down today, there was a redness noted of the second toe on the right side and mild cellulitis of the forefoot. No pus discharge or fever. Electronic Signature(s) Signed: 06/19/2015 3:43:40 PM By: Christin Fudge MD, FACS Previous Signature: 06/19/2015 3:34:35 PM Version By: Christin Fudge MD, FACS Previous Signature: 06/15/2015 9:18:38 AM Version By: Christin Fudge MD, FACS Entered By: Christin Fudge on 06/19/2015 15:43:40 [name] ([id number]) -------------------------------------------------------------------------------- Physical Exam Details Patient Name: [name] Date of Service: 06/15/2015 8:45 AM Medical Record Number: [id number] Patient Account Number: [id number] Date of Birth/Sex: Feb 07, 1956 (59 y.o. Female) Treating RN: Baruch Gouty, RN, BSN, [place] Harman Primary Care Physician: [name] Other  Clinician: Referring Physician: [name] Treating Physician/Extender: Frann Rider in Treatment: 22 Constitutional . Pulse regular. Respirations normal and unlabored. Afebrile. . Eyes Nonicteric. Reactive to light. Ears, Nose, Mouth, and Throat Lips, teeth, and gums WNL.Marland Kitchen Moist mucosa without lesions . Neck supple and nontender. No palpable supraclavicular or cervical adenopathy. Normal sized without goiter. Respiratory WNL. No retractions.. Cardiovascular Pedal Pulses WNL. No clubbing, cyanosis or edema. Lymphatic No adneopathy. No adenopathy. No adenopathy. Musculoskeletal Adexa without tenderness or enlargement.. Digits and nails w/o clubbing, cyanosis, infection, petechiae, ischemia, or inflammatory conditions.. Integumentary (Hair, Skin) No suspicious lesions. No crepitus or fluctuance. No peri-wound warmth or erythema. No masses.Marland Kitchen Psychiatric Judgement and insight Intact.. No evidence of depression, anxiety, or agitation.. Notes The wound on the right foot second toe is clean and there is no pus discharge on the plantar aspect of the wound. However there is cellulitis of the right second toe and the right forefoot and there is no evidence of any pocket of pus. Electronic Signature(s) Signed: 06/15/2015 9:19:18 AM By: Christin Fudge MD, FACS Entered By: Christin Fudge on 06/15/2015 09:19:18 [name] ([id number]) -------------------------------------------------------------------------------- Physician Orders Details Patient Name: [name] Date of Service: 06/15/2015 8:45 AM Medical Record Number: [id number] Patient Account Number: [id number] Date of Birth/Sex: 02/27/1956 (59 y.o. Female) Treating RN: Baruch Gouty, RN, BSN, Velva Harman Primary Care Physician: [name] Other Clinician: Referring Physician: [name] Treating Physician/Extender:  home and doing so requires considerable and taxing effort. Other absences are for medical reasons / religious services and are infrequent or of short duration when for other reasons). If current dressing causes regression in wound condition, may D/C ordered dressing product/s and apply Normal Saline Moist Dressing daily until next [place] / Other MD appointment. [place] of  regression in wound condition at [phone]. Please direct any NON-WOUND related issues/requests for orders to patient's Primary Care Physician Medications-please add to medication list.: Wound #7 Right Toe Second: P.O. Antibiotics - Doxycycline 100mg  PO BID x7 days Wound #8 Right,Dorsal Toe Third: P.O. Antibiotics - Doxycycline 100mg  PO BID x7 days Radiology ordered were: X-ray, foot - right foot The following medication(s) was prescribed: doxycycline hyclate oral 100 mg capsule capsule oral 1 bid starting 06/15/2015 TAMEAKA, CANNOY ([id number]) This 59 year old patient with a Wagner Grade 2 , DFU of the right foot has had osteomyelitis of the left ankle and foot, in the past and has just finished her treatment with hyperbaric oxygen therapy. today she was noted to have a cellulitis of the right second toe and forefoot and hence I have asked for a plain x-ray of the right foot. Will also empirically put her on doxycycline 100 mg twice a day for 14 days. She will continue with [employer] locally and off loading and we will keep a close eye on this. Electronic Signature(s) Signed: 06/19/2015 3:44:06 PM By: Christin Fudge MD, FACS Previous Signature: 06/15/2015 9:22:21 AM Version By: Christin Fudge MD, FACS Entered By: Christin Fudge on 06/19/2015 15:44:06 [name] ([id number]) -------------------------------------------------------------------------------- SuperBill Details Patient Name: [name] Date of Service: 06/15/2015 Medical Record Number: [id number] Patient Account Number: [id number] Date of Birth/Sex: April 26, 1956 (59 y.o. 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 L97.511 Non-pressure chronic ulcer of other part of right foot limited to breakdown of skin L03.115 Cellulitis of right lower limb Facility Procedures CPT4 Code: [id number] Description: 99213 - WOUND CARE VISIT-LEV 3 EST PT Modifier: Quantity: 1 Physician Procedures CPT4 Code Description: [id number] - WC PHYS LEVEL 3 - EST PT ICD-10 Description Diagnosis E11.621 Type 2 diabetes mellitus with foot ulcer L03.115 Cellulitis of right lower limb L97.512 Non-pressure chronic ulcer of other part of right fo Modifier: ot with fat lay Quantity: 1 er exposed Electronic Signature(s) Signed: 06/15/2015 9:22:39 AM By: Christin Fudge MD, FACS Entered By: Christin Fudge on 06/15/2015 09:22:39

## 2015-06-15 NOTE — Progress Notes (Addendum)
Goal Status: Active Patient/caregiver will verbalize understanding of disease process and disease management Date Initiated: 01/17/2015 Goal Status: Active Signs and symptoms for osteomyelitis will be recognized and promptly addressed [name], [name] ([id number]) Date Initiated: 01/17/2015 Goal Status: Active Interventions: Assess for signs and symptoms of osteomyelitis resolution every visit Provide education on  osteomyelitis Treatment Activities: MRI : 06/15/2015 Test ordered outside of clinic : 06/15/2015 X-ray : 06/15/2015 Notes: Soft Tissue Infection Nursing Diagnoses: Knowledge deficit related to disease process and management Potential for infection: soft tissue Goals: Patient/caregiver will verbalize understanding of or measures to prevent infection and contamination in the home setting Date Initiated: 01/17/2015 Goal Status: Active Patient's soft tissue infection will resolve Date Initiated: 01/17/2015 Goal Status: Active Signs and symptoms of infection will be recognized early to allow for prompt treatment Date Initiated: 01/17/2015 Goal Status: Active Interventions: Assess signs and symptoms of infection every visit Provide education on infection Screen for HBO Treatment Activities: Culture : 06/15/2015 Culture and sensitivity : 06/15/2015 Education provided on Infection : 04/20/2015 Systemic antibiotics : 06/15/2015 Test ordered outside of clinic : 06/15/2015 Notes: [name], [name] ([id number]) Wound/Skin Impairment Nursing Diagnoses: Impaired tissue integrity Knowledge deficit related to ulceration/compromised skin integrity Goals: Patient/caregiver will verbalize understanding of skin care regimen Date Initiated: 01/10/2015 Goal Status: Active Ulcer/skin breakdown will have a volume reduction of 30% by week 4 Date Initiated: 01/10/2015 Goal Status: Active Ulcer/skin breakdown will have a volume reduction of 50% by week 8 Date Initiated: 01/10/2015 Goal Status: Active Ulcer/skin breakdown will have a volume reduction of 80% by week 12 Date Initiated: 01/10/2015 Goal Status: Active Ulcer/skin breakdown will heal within 14 weeks Date Initiated: 01/10/2015 Goal Status: Active Interventions: Assess patient/caregiver ability to obtain necessary supplies Assess patient/caregiver ability to perform ulcer/skin care regimen upon admission and as needed Assess ulceration(s) every  visit Provide education on smoking Provide education on ulcer and skin care Treatment Activities: Consult for HBO : 06/15/2015 Skin care regimen initiated : 06/15/2015 Topical wound management initiated : 06/15/2015 Notes: Electronic Signature(s) Signed: 06/15/2015 9:03:48 AM By: Regan Lemming BSN, RN Entered By: Regan Lemming on 06/15/2015 09:03:48 [name] ([id number]) -------------------------------------------------------------------------------- Pain Assessment Details Patient Name: [name] Date of Service: 06/15/2015 8:45 AM Medical Record Number: [id number] Patient Account Number: [id number] Date of Birth/Sex: Jan 08, 1956 (59 y.o. Female) Treating RN: Baruch Gouty, RN, BSN, Velva Harman Primary Care Physician: [name] Other Clinician: Referring Physician: [name] Treating Physician/Extender: Frann Rider in Treatment: 22 Active Problems Location of Pain Severity and Description of Pain Patient Has Paino No Site Locations Pain Management and Medication Current Pain Management: Electronic Signature(s) Signed: 06/15/2015 8:53:15 AM By: Regan Lemming BSN, RN Entered By: Regan Lemming on 06/15/2015 08:53:15 [name] ([id number]) -------------------------------------------------------------------------------- Patient/Caregiver Education Details Patient Name: [name] Date of Service: 06/15/2015 8:45 AM Medical Record Number: [id number] Patient Account Number: [id number] Date of Birth/Gender: 05/09/1956 (59 y.o. Female) Treating RN: Afful, RN, BSN, Velva Harman Primary Care Physician: [name] Other Clinician: Referring Physician: [name] Treating Physician/Extender: Frann Rider in Treatment: 22 Education Assessment Education Provided To: Patient Education Topics Provided Basic Hygiene: Methods: Explain/Verbal Responses: State content correctly Infection: Methods: Explain/Verbal Responses: State content correctly Safety: Methods:  Explain/Verbal Responses: State content correctly Smoking and Wound Healing: Methods: Explain/Verbal Responses: State content correctly Welcome To [facility]: Methods: Explain/Verbal Responses: State content correctly Wound/Skin Impairment: Methods: Explain/Verbal Responses: State content correctly Electronic Signature(s) Signed: 06/15/2015 9:10:25 AM By: Regan Lemming BSN, RN Entered By: Regan Lemming  Goal Status: Active Patient/caregiver will verbalize understanding of disease process and disease management Date Initiated: 01/17/2015 Goal Status: Active Signs and symptoms for osteomyelitis will be recognized and promptly addressed [name], [name] ([id number]) Date Initiated: 01/17/2015 Goal Status: Active Interventions: Assess for signs and symptoms of osteomyelitis resolution every visit Provide education on  osteomyelitis Treatment Activities: MRI : 06/15/2015 Test ordered outside of clinic : 06/15/2015 X-ray : 06/15/2015 Notes: Soft Tissue Infection Nursing Diagnoses: Knowledge deficit related to disease process and management Potential for infection: soft tissue Goals: Patient/caregiver will verbalize understanding of or measures to prevent infection and contamination in the home setting Date Initiated: 01/17/2015 Goal Status: Active Patient's soft tissue infection will resolve Date Initiated: 01/17/2015 Goal Status: Active Signs and symptoms of infection will be recognized early to allow for prompt treatment Date Initiated: 01/17/2015 Goal Status: Active Interventions: Assess signs and symptoms of infection every visit Provide education on infection Screen for HBO Treatment Activities: Culture : 06/15/2015 Culture and sensitivity : 06/15/2015 Education provided on Infection : 04/20/2015 Systemic antibiotics : 06/15/2015 Test ordered outside of clinic : 06/15/2015 Notes: [name], [name] ([id number]) Wound/Skin Impairment Nursing Diagnoses: Impaired tissue integrity Knowledge deficit related to ulceration/compromised skin integrity Goals: Patient/caregiver will verbalize understanding of skin care regimen Date Initiated: 01/10/2015 Goal Status: Active Ulcer/skin breakdown will have a volume reduction of 30% by week 4 Date Initiated: 01/10/2015 Goal Status: Active Ulcer/skin breakdown will have a volume reduction of 50% by week 8 Date Initiated: 01/10/2015 Goal Status: Active Ulcer/skin breakdown will have a volume reduction of 80% by week 12 Date Initiated: 01/10/2015 Goal Status: Active Ulcer/skin breakdown will heal within 14 weeks Date Initiated: 01/10/2015 Goal Status: Active Interventions: Assess patient/caregiver ability to obtain necessary supplies Assess patient/caregiver ability to perform ulcer/skin care regimen upon admission and as needed Assess ulceration(s) every  visit Provide education on smoking Provide education on ulcer and skin care Treatment Activities: Consult for HBO : 06/15/2015 Skin care regimen initiated : 06/15/2015 Topical wound management initiated : 06/15/2015 Notes: Electronic Signature(s) Signed: 06/15/2015 9:03:48 AM By: Regan Lemming BSN, RN Entered By: Regan Lemming on 06/15/2015 09:03:48 [name] ([id number]) -------------------------------------------------------------------------------- Pain Assessment Details Patient Name: [name] Date of Service: 06/15/2015 8:45 AM Medical Record Number: [id number] Patient Account Number: [id number] Date of Birth/Sex: Jan 08, 1956 (59 y.o. Female) Treating RN: Baruch Gouty, RN, BSN, Velva Harman Primary Care Physician: [name] Other Clinician: Referring Physician: [name] Treating Physician/Extender: Frann Rider in Treatment: 22 Active Problems Location of Pain Severity and Description of Pain Patient Has Paino No Site Locations Pain Management and Medication Current Pain Management: Electronic Signature(s) Signed: 06/15/2015 8:53:15 AM By: Regan Lemming BSN, RN Entered By: Regan Lemming on 06/15/2015 08:53:15 [name] ([id number]) -------------------------------------------------------------------------------- Patient/Caregiver Education Details Patient Name: [name] Date of Service: 06/15/2015 8:45 AM Medical Record Number: [id number] Patient Account Number: [id number] Date of Birth/Gender: 05/09/1956 (59 y.o. Female) Treating RN: Afful, RN, BSN, Velva Harman Primary Care Physician: [name] Other Clinician: Referring Physician: [name] Treating Physician/Extender: Frann Rider in Treatment: 22 Education Assessment Education Provided To: Patient Education Topics Provided Basic Hygiene: Methods: Explain/Verbal Responses: State content correctly Infection: Methods: Explain/Verbal Responses: State content correctly Safety: Methods:  Explain/Verbal Responses: State content correctly Smoking and Wound Healing: Methods: Explain/Verbal Responses: State content correctly Welcome To [facility]: Methods: Explain/Verbal Responses: State content correctly Wound/Skin Impairment: Methods: Explain/Verbal Responses: State content correctly Electronic Signature(s) Signed: 06/15/2015 9:10:25 AM By: Regan Lemming BSN, RN Entered By: Regan Lemming  Goal Status: Active Patient/caregiver will verbalize understanding of disease process and disease management Date Initiated: 01/17/2015 Goal Status: Active Signs and symptoms for osteomyelitis will be recognized and promptly addressed [name], [name] ([id number]) Date Initiated: 01/17/2015 Goal Status: Active Interventions: Assess for signs and symptoms of osteomyelitis resolution every visit Provide education on  osteomyelitis Treatment Activities: MRI : 06/15/2015 Test ordered outside of clinic : 06/15/2015 X-ray : 06/15/2015 Notes: Soft Tissue Infection Nursing Diagnoses: Knowledge deficit related to disease process and management Potential for infection: soft tissue Goals: Patient/caregiver will verbalize understanding of or measures to prevent infection and contamination in the home setting Date Initiated: 01/17/2015 Goal Status: Active Patient's soft tissue infection will resolve Date Initiated: 01/17/2015 Goal Status: Active Signs and symptoms of infection will be recognized early to allow for prompt treatment Date Initiated: 01/17/2015 Goal Status: Active Interventions: Assess signs and symptoms of infection every visit Provide education on infection Screen for HBO Treatment Activities: Culture : 06/15/2015 Culture and sensitivity : 06/15/2015 Education provided on Infection : 04/20/2015 Systemic antibiotics : 06/15/2015 Test ordered outside of clinic : 06/15/2015 Notes: [name], [name] ([id number]) Wound/Skin Impairment Nursing Diagnoses: Impaired tissue integrity Knowledge deficit related to ulceration/compromised skin integrity Goals: Patient/caregiver will verbalize understanding of skin care regimen Date Initiated: 01/10/2015 Goal Status: Active Ulcer/skin breakdown will have a volume reduction of 30% by week 4 Date Initiated: 01/10/2015 Goal Status: Active Ulcer/skin breakdown will have a volume reduction of 50% by week 8 Date Initiated: 01/10/2015 Goal Status: Active Ulcer/skin breakdown will have a volume reduction of 80% by week 12 Date Initiated: 01/10/2015 Goal Status: Active Ulcer/skin breakdown will heal within 14 weeks Date Initiated: 01/10/2015 Goal Status: Active Interventions: Assess patient/caregiver ability to obtain necessary supplies Assess patient/caregiver ability to perform ulcer/skin care regimen upon admission and as needed Assess ulceration(s) every  visit Provide education on smoking Provide education on ulcer and skin care Treatment Activities: Consult for HBO : 06/15/2015 Skin care regimen initiated : 06/15/2015 Topical wound management initiated : 06/15/2015 Notes: Electronic Signature(s) Signed: 06/15/2015 9:03:48 AM By: Regan Lemming BSN, RN Entered By: Regan Lemming on 06/15/2015 09:03:48 [name] ([id number]) -------------------------------------------------------------------------------- Pain Assessment Details Patient Name: [name] Date of Service: 06/15/2015 8:45 AM Medical Record Number: [id number] Patient Account Number: [id number] Date of Birth/Sex: Jan 08, 1956 (59 y.o. Female) Treating RN: Baruch Gouty, RN, BSN, Velva Harman Primary Care Physician: [name] Other Clinician: Referring Physician: [name] Treating Physician/Extender: Frann Rider in Treatment: 22 Active Problems Location of Pain Severity and Description of Pain Patient Has Paino No Site Locations Pain Management and Medication Current Pain Management: Electronic Signature(s) Signed: 06/15/2015 8:53:15 AM By: Regan Lemming BSN, RN Entered By: Regan Lemming on 06/15/2015 08:53:15 [name] ([id number]) -------------------------------------------------------------------------------- Patient/Caregiver Education Details Patient Name: [name] Date of Service: 06/15/2015 8:45 AM Medical Record Number: [id number] Patient Account Number: [id number] Date of Birth/Gender: 05/09/1956 (59 y.o. Female) Treating RN: Afful, RN, BSN, Velva Harman Primary Care Physician: [name] Other Clinician: Referring Physician: [name] Treating Physician/Extender: Frann Rider in Treatment: 22 Education Assessment Education Provided To: Patient Education Topics Provided Basic Hygiene: Methods: Explain/Verbal Responses: State content correctly Infection: Methods: Explain/Verbal Responses: State content correctly Safety: Methods:  Explain/Verbal Responses: State content correctly Smoking and Wound Healing: Methods: Explain/Verbal Responses: State content correctly Welcome To [facility]: Methods: Explain/Verbal Responses: State content correctly Wound/Skin Impairment: Methods: Explain/Verbal Responses: State content correctly Electronic Signature(s) Signed: 06/15/2015 9:10:25 AM By: Regan Lemming BSN, RN Entered By: Regan Lemming  [name], [name] ([id number]) Visit Report for 06/15/2015 Arrival Information Details Patient Name: [name], [name] Date of Service: 06/15/2015 8:45 AM Medical Record Number: [id number] Patient Account Number: [id number] Date of Birth/Sex: Jan 07, 1956 (59 y.o. Female) Treating RN: Afful, RN, BSN, Velva Harman Primary Care Physician: [name] Other Clinician: Referring Physician: [name] Treating Physician/Extender: Frann Rider in Treatment: 51 Visit Information History Since Last Visit Any new allergies or adverse reactions: No Patient Arrived: Wheel Chair Had a fall or experienced change in No Arrival Time: 08:50 activities of daily living that may affect Accompanied By: husband risk of falls: Transfer Assistance: None Signs or symptoms of abuse/neglect since last No Patient Identification Verified: Yes visito Secondary Verification Process Yes Hospitalized since last visit: No Completed: Has Dressing in Place as Prescribed: Yes Patient Requires Transmission- No Pain Present Now: No Based Precautions: Patient Has Alerts: Yes Patient Alerts: Patient on Blood Thinner ABI L:0.8, R:0.93 Electronic Signature(s) Signed: 06/15/2015 8:51:11 AM By: Regan Lemming BSN, RN Entered By: Regan Lemming on 06/15/2015 08:51:11 [name] ([id number]) -------------------------------------------------------------------------------- Clinic Level of Care Assessment Details Patient Name: [name] Date of Service: 06/15/2015 8:45 AM Medical Record Number: [id number] Patient Account Number: [id number] Date of Birth/Sex: [birth year] (59 y.o. Female) Treating RN: Afful, RN, BSN, [place] Primary Care Physician: [name] Other Clinician: Referring Physician: [name] Treating Physician/Extender: Frann Rider in Treatment: 22 Clinic Level of Care Assessment Items TOOL 4 Quantity Score []  - Use when only an EandM is performed on FOLLOW-UP visit 0 ASSESSMENTS -  Nursing Assessment / Reassessment X - Reassessment of Co-morbidities (includes updates in patient status) 1 10 X - Reassessment of Adherence to Treatment Plan 1 5 ASSESSMENTS - Wound and Skin Assessment / Reassessment []  - Simple Wound Assessment / Reassessment - one wound 0 X - Complex Wound Assessment / Reassessment - multiple wounds 2 5 []  - Dermatologic / Skin Assessment (not related to wound area) 0 ASSESSMENTS - Focused Assessment []  - Circumferential Edema Measurements - multi extremities 0 []  - Nutritional Assessment / Counseling / Intervention 0 X - Lower Extremity Assessment (monofilament, tuning fork, pulses) 1 5 []  - Peripheral Arterial Disease Assessment (using hand held doppler) 0 ASSESSMENTS - Ostomy and/or Continence Assessment and Care []  - Incontinence Assessment and Management 0 []  - Ostomy Care Assessment and Management (repouching, etc.) 0 PROCESS - Coordination of Care X - Simple Patient / Family Education for ongoing care 1 15 []  - Complex (extensive) Patient / Family Education for ongoing care 0 []  - Staff obtains Programmer, systems, Records, Test Results / Process Orders 0 []  - Staff telephones HHA, Nursing Homes / Clarify orders / etc 0 []  - Routine Transfer to another Facility (non-emergent condition) 0 [name], [name] ([id number]) []  - Routine Hospital Admission (non-emergent condition) 0 []  - New Admissions / Biomedical engineer / Ordering NPWT, Apligraf, etc. 0 []  - Emergency Hospital Admission (emergent condition) 0 []  - Simple Discharge Coordination 0 []  - Complex (extensive) Discharge Coordination 0 PROCESS - Special Needs []  - Pediatric / Minor Patient Management 0 []  - Isolation Patient Management 0 []  - Hearing / Language / Visual special needs 0 []  - Assessment of Community assistance (transportation, D/C planning, etc.) 0 []  - Additional assistance / Altered mentation 0 []  - Support Surface(s) Assessment (bed, cushion, seat, etc.) 0 INTERVENTIONS  - Wound Cleansing / Measurement []  - Simple Wound Cleansing - one wound 0 X - Complex Wound Cleansing - multiple wounds 2 5 X - Wound Imaging (  [name], [name] ([id number]) Visit Report for 06/15/2015 Arrival Information Details Patient Name: [name], [name] Date of Service: 06/15/2015 8:45 AM Medical Record Number: [id number] Patient Account Number: [id number] Date of Birth/Sex: Jan 07, 1956 (59 y.o. Female) Treating RN: Afful, RN, BSN, Velva Harman Primary Care Physician: [name] Other Clinician: Referring Physician: [name] Treating Physician/Extender: Frann Rider in Treatment: 51 Visit Information History Since Last Visit Any new allergies or adverse reactions: No Patient Arrived: Wheel Chair Had a fall or experienced change in No Arrival Time: 08:50 activities of daily living that may affect Accompanied By: husband risk of falls: Transfer Assistance: None Signs or symptoms of abuse/neglect since last No Patient Identification Verified: Yes visito Secondary Verification Process Yes Hospitalized since last visit: No Completed: Has Dressing in Place as Prescribed: Yes Patient Requires Transmission- No Pain Present Now: No Based Precautions: Patient Has Alerts: Yes Patient Alerts: Patient on Blood Thinner ABI L:0.8, R:0.93 Electronic Signature(s) Signed: 06/15/2015 8:51:11 AM By: Regan Lemming BSN, RN Entered By: Regan Lemming on 06/15/2015 08:51:11 [name] ([id number]) -------------------------------------------------------------------------------- Clinic Level of Care Assessment Details Patient Name: [name] Date of Service: 06/15/2015 8:45 AM Medical Record Number: [id number] Patient Account Number: [id number] Date of Birth/Sex: [birth year] (59 y.o. Female) Treating RN: Afful, RN, BSN, [place] Primary Care Physician: [name] Other Clinician: Referring Physician: [name] Treating Physician/Extender: Frann Rider in Treatment: 22 Clinic Level of Care Assessment Items TOOL 4 Quantity Score []  - Use when only an EandM is performed on FOLLOW-UP visit 0 ASSESSMENTS -  Nursing Assessment / Reassessment X - Reassessment of Co-morbidities (includes updates in patient status) 1 10 X - Reassessment of Adherence to Treatment Plan 1 5 ASSESSMENTS - Wound and Skin Assessment / Reassessment []  - Simple Wound Assessment / Reassessment - one wound 0 X - Complex Wound Assessment / Reassessment - multiple wounds 2 5 []  - Dermatologic / Skin Assessment (not related to wound area) 0 ASSESSMENTS - Focused Assessment []  - Circumferential Edema Measurements - multi extremities 0 []  - Nutritional Assessment / Counseling / Intervention 0 X - Lower Extremity Assessment (monofilament, tuning fork, pulses) 1 5 []  - Peripheral Arterial Disease Assessment (using hand held doppler) 0 ASSESSMENTS - Ostomy and/or Continence Assessment and Care []  - Incontinence Assessment and Management 0 []  - Ostomy Care Assessment and Management (repouching, etc.) 0 PROCESS - Coordination of Care X - Simple Patient / Family Education for ongoing care 1 15 []  - Complex (extensive) Patient / Family Education for ongoing care 0 []  - Staff obtains Programmer, systems, Records, Test Results / Process Orders 0 []  - Staff telephones HHA, Nursing Homes / Clarify orders / etc 0 []  - Routine Transfer to another Facility (non-emergent condition) 0 [name], [name] ([id number]) []  - Routine Hospital Admission (non-emergent condition) 0 []  - New Admissions / Biomedical engineer / Ordering NPWT, Apligraf, etc. 0 []  - Emergency Hospital Admission (emergent condition) 0 []  - Simple Discharge Coordination 0 []  - Complex (extensive) Discharge Coordination 0 PROCESS - Special Needs []  - Pediatric / Minor Patient Management 0 []  - Isolation Patient Management 0 []  - Hearing / Language / Visual special needs 0 []  - Assessment of Community assistance (transportation, D/C planning, etc.) 0 []  - Additional assistance / Altered mentation 0 []  - Support Surface(s) Assessment (bed, cushion, seat, etc.) 0 INTERVENTIONS  - Wound Cleansing / Measurement []  - Simple Wound Cleansing - one wound 0 X - Complex Wound Cleansing - multiple wounds 2 5 X - Wound Imaging (  [name], [name] ([id number]) Visit Report for 06/15/2015 Arrival Information Details Patient Name: [name], [name] Date of Service: 06/15/2015 8:45 AM Medical Record Number: [id number] Patient Account Number: [id number] Date of Birth/Sex: Jan 07, 1956 (59 y.o. Female) Treating RN: Afful, RN, BSN, Velva Harman Primary Care Physician: [name] Other Clinician: Referring Physician: [name] Treating Physician/Extender: Frann Rider in Treatment: 51 Visit Information History Since Last Visit Any new allergies or adverse reactions: No Patient Arrived: Wheel Chair Had a fall or experienced change in No Arrival Time: 08:50 activities of daily living that may affect Accompanied By: husband risk of falls: Transfer Assistance: None Signs or symptoms of abuse/neglect since last No Patient Identification Verified: Yes visito Secondary Verification Process Yes Hospitalized since last visit: No Completed: Has Dressing in Place as Prescribed: Yes Patient Requires Transmission- No Pain Present Now: No Based Precautions: Patient Has Alerts: Yes Patient Alerts: Patient on Blood Thinner ABI L:0.8, R:0.93 Electronic Signature(s) Signed: 06/15/2015 8:51:11 AM By: Regan Lemming BSN, RN Entered By: Regan Lemming on 06/15/2015 08:51:11 [name] ([id number]) -------------------------------------------------------------------------------- Clinic Level of Care Assessment Details Patient Name: [name] Date of Service: 06/15/2015 8:45 AM Medical Record Number: [id number] Patient Account Number: [id number] Date of Birth/Sex: [birth year] (59 y.o. Female) Treating RN: Afful, RN, BSN, [place] Primary Care Physician: [name] Other Clinician: Referring Physician: [name] Treating Physician/Extender: Frann Rider in Treatment: 22 Clinic Level of Care Assessment Items TOOL 4 Quantity Score []  - Use when only an EandM is performed on FOLLOW-UP visit 0 ASSESSMENTS -  Nursing Assessment / Reassessment X - Reassessment of Co-morbidities (includes updates in patient status) 1 10 X - Reassessment of Adherence to Treatment Plan 1 5 ASSESSMENTS - Wound and Skin Assessment / Reassessment []  - Simple Wound Assessment / Reassessment - one wound 0 X - Complex Wound Assessment / Reassessment - multiple wounds 2 5 []  - Dermatologic / Skin Assessment (not related to wound area) 0 ASSESSMENTS - Focused Assessment []  - Circumferential Edema Measurements - multi extremities 0 []  - Nutritional Assessment / Counseling / Intervention 0 X - Lower Extremity Assessment (monofilament, tuning fork, pulses) 1 5 []  - Peripheral Arterial Disease Assessment (using hand held doppler) 0 ASSESSMENTS - Ostomy and/or Continence Assessment and Care []  - Incontinence Assessment and Management 0 []  - Ostomy Care Assessment and Management (repouching, etc.) 0 PROCESS - Coordination of Care X - Simple Patient / Family Education for ongoing care 1 15 []  - Complex (extensive) Patient / Family Education for ongoing care 0 []  - Staff obtains Programmer, systems, Records, Test Results / Process Orders 0 []  - Staff telephones HHA, Nursing Homes / Clarify orders / etc 0 []  - Routine Transfer to another Facility (non-emergent condition) 0 [name], [name] ([id number]) []  - Routine Hospital Admission (non-emergent condition) 0 []  - New Admissions / Biomedical engineer / Ordering NPWT, Apligraf, etc. 0 []  - Emergency Hospital Admission (emergent condition) 0 []  - Simple Discharge Coordination 0 []  - Complex (extensive) Discharge Coordination 0 PROCESS - Special Needs []  - Pediatric / Minor Patient Management 0 []  - Isolation Patient Management 0 []  - Hearing / Language / Visual special needs 0 []  - Assessment of Community assistance (transportation, D/C planning, etc.) 0 []  - Additional assistance / Altered mentation 0 []  - Support Surface(s) Assessment (bed, cushion, seat, etc.) 0 INTERVENTIONS  - Wound Cleansing / Measurement []  - Simple Wound Cleansing - one wound 0 X - Complex Wound Cleansing - multiple wounds 2 5 X - Wound Imaging (

## 2015-06-15 NOTE — Progress Notes (Signed)
TEREASA, BURSEY ([id number]) Visit Report for 06/14/2015 HBO Details Patient Name: [name], [name] Date of Service: 06/14/2015 10:00 AM Medical Record Number: [id number] Patient Account Number: [id number] Date of Birth/Sex: 06-20-1956 (59 y.o. 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 Second End Date: Toe HBO Discharge Treatment Series Complete; Outcome: Improved Wound Perfusion HBO Treatment Details Treatment Number: 40 Patient Type: Outpatient Chamber Type: Monoplace Chamber #: HBO [id number] Treatment Protocol: 2.0 ATA with 90 minutes oxygen, and no air breaks Treatment Details Compression Rate Down: 1.5 psi / minute De-Compression Rate Up: 1.5 psi / minute Air breaks and breathing Compress Tx Pressure Decompress Decompress periods Begins Reached Begins Ends (leave unused spaces blank) Chamber Pressure 1 ATA 2.0 ATA - - - - - - 2.0 ATA 1 ATA Clock Time (24 hr) 09:36 09:46 - - - - - - 11:17 11:27 Treatment Length: 111 (minutes) Treatment Segments: 4 Capillary Blood Glucose Pre Capillary Blood Glucose (mg/dl): Post Capillary Blood Glucose (mg/dl): Vital Signs Capillary Blood Glucose Reference Range: 80 - 120 mg / dl HBO Diabetic Blood Glucose Intervention Range: <131 mg/dl or >249 mg/dl Time Vitals Blood Respiratory Capillary Blood Glucose Pulse Action Type: Pulse: Temperature: Taken: Pressure: Rate: Glucose (mg/dl): Meter #: Oximetry (%) Taken: Pre 09:22 100/56 72 18 98.3 268 1 none Post 11:30 154/74 78 18 98.2 247 1 none Treatment Response [name], [name]  ([id number]) Treatment Well Toleration: Treatment Treatment Completed without Adverse Event Completion Status: HBO Attestation I certify that I supervised this HBO treatment in accordance with Medicare guidelines. A trained Yes emergency response team is readily available per hospital policies and procedures. Continue HBOT as ordered. Yes Electronic Signature(s) Signed: 06/14/2015 4:02:17 PM By: Loletha Grayer MD Entered By: Loletha Grayer on 06/14/2015 12:03:21 [name] ([id number]) -------------------------------------------------------------------------------- HBO Safety Checklist Details Patient Name: [name] Date of Service: 06/14/2015 10:00 AM Medical Record Number: [id number] Patient Account Number: [id number] Date of Birth/Sex: Aug 12, 1956 (59 y.o. Female) Treating RN: Primary Care Physician: [name] Other Clinician: Jacqulyn Bath Referring Physician: [name] Treating Physician/Extender: BURNS III, Charlean Sanfilippo in Treatment: 22 HBO Safety Checklist Items Safety Checklist Consent Form Signed Patient voided / foley secured and emptied When did you last eato 07:30 am Last dose of injectable or oral agent 07:30 am NA Ostomy pouch emptied and vented if applicable All implantable devices assessed, documented and approved ICD NA Intravenous access site secured and place Valuables secured Linens and cotton and cotton/polyester blend (less than 51% polyester) Personal oil-based products / skin lotions / body lotions removed Wigs or hairpieces removed Smoking or tobacco materials removed Books / newspapers / magazines / loose paper removed Cologne, aftershave, perfume and deodorant removed Jewelry removed (may wrap wedding band) Make-up removed Hair care products removed Battery operated devices (external) removed Heating patches and chemical warmers removed NA Titanium eyewear removed NA Nail polish cured greater than 10 hours NA  Casting material cured greater than 10 hours NA Hearing aids removed NA Loose dentures or partials removed NA Prosthetics have been removed Patient demonstrates correct use of air break device (if applicable) Patient concerns have been addressed Patient grounding bracelet on and cord attached to chamber Specifics for Inpatients (complete in addition  to above) Medication sheet sent with patient Intravenous medications needed or due during therapy sent with patient [name], [name] ([id number]) Drainage tubes (e.g. nasogastric tube or chest tube secured and vented) Endotracheal or Tracheotomy tube secured Cuff deflated of air and inflated with saline Airway suctioned Electronic Signature(s) Signed: 06/14/2015 4:06:40 PM By: Lorine Bears RCP, RRT, CHT Entered By: Lorine Bears on 06/14/2015 10:06:30

## 2015-06-16 ENCOUNTER — Encounter: Payer: Self-pay | Admitting: *Deleted

## 2015-06-16 ENCOUNTER — Inpatient Hospital Stay
Admission: EM | Admit: 2015-06-16 | Discharge: 2015-06-19 | DRG: 580 | Disposition: A | Discharge status: Home / Self Care | Payer: PPO | Attending: Internal Medicine | Admitting: Internal Medicine

## 2015-06-16 ENCOUNTER — Other Ambulatory Visit: Payer: Self-pay | Admitting: Surgery

## 2015-06-16 DIAGNOSIS — Z9851 Tubal ligation status: Secondary | ICD-10-CM

## 2015-06-16 DIAGNOSIS — Z9049 Acquired absence of other specified parts of digestive tract: Secondary | ICD-10-CM | POA: Diagnosis present

## 2015-06-16 DIAGNOSIS — M869 Osteomyelitis, unspecified: Secondary | ICD-10-CM | POA: Diagnosis present

## 2015-06-16 DIAGNOSIS — E119 Type 2 diabetes mellitus without complications: Secondary | ICD-10-CM | POA: Diagnosis present

## 2015-06-16 DIAGNOSIS — Z794 Long term (current) use of insulin: Secondary | ICD-10-CM

## 2015-06-16 DIAGNOSIS — S81801A Unspecified open wound, right lower leg, initial encounter: Secondary | ICD-10-CM

## 2015-06-16 DIAGNOSIS — Z95810 Presence of automatic (implantable) cardiac defibrillator: Secondary | ICD-10-CM

## 2015-06-16 DIAGNOSIS — Z9889 Other specified postprocedural states: Secondary | ICD-10-CM

## 2015-06-16 DIAGNOSIS — Z8042 Family history of malignant neoplasm of prostate: Secondary | ICD-10-CM

## 2015-06-16 DIAGNOSIS — G4733 Obstructive sleep apnea (adult) (pediatric): Secondary | ICD-10-CM | POA: Diagnosis present

## 2015-06-16 DIAGNOSIS — I10 Essential (primary) hypertension: Secondary | ICD-10-CM | POA: Diagnosis present

## 2015-06-16 DIAGNOSIS — Z833 Family history of diabetes mellitus: Secondary | ICD-10-CM

## 2015-06-16 DIAGNOSIS — Z8261 Family history of arthritis: Secondary | ICD-10-CM

## 2015-06-16 DIAGNOSIS — E785 Hyperlipidemia, unspecified: Secondary | ICD-10-CM | POA: Diagnosis present

## 2015-06-16 DIAGNOSIS — E559 Vitamin D deficiency, unspecified: Secondary | ICD-10-CM | POA: Diagnosis present

## 2015-06-16 DIAGNOSIS — E780 Pure hypercholesterolemia: Secondary | ICD-10-CM | POA: Diagnosis present

## 2015-06-16 DIAGNOSIS — E669 Obesity, unspecified: Secondary | ICD-10-CM | POA: Diagnosis present

## 2015-06-16 DIAGNOSIS — E1140 Type 2 diabetes mellitus with diabetic neuropathy, unspecified: Secondary | ICD-10-CM | POA: Diagnosis present

## 2015-06-16 DIAGNOSIS — Z8249 Family history of ischemic heart disease and other diseases of the circulatory system: Secondary | ICD-10-CM

## 2015-06-16 DIAGNOSIS — Z808 Family history of malignant neoplasm of other organs or systems: Secondary | ICD-10-CM

## 2015-06-16 DIAGNOSIS — I5022 Chronic systolic (congestive) heart failure: Secondary | ICD-10-CM | POA: Diagnosis present

## 2015-06-16 DIAGNOSIS — L539 Erythematous condition, unspecified: Secondary | ICD-10-CM

## 2015-06-16 DIAGNOSIS — I2510 Atherosclerotic heart disease of native coronary artery without angina pectoris: Secondary | ICD-10-CM | POA: Diagnosis present

## 2015-06-16 DIAGNOSIS — L97429 Non-pressure chronic ulcer of left heel and midfoot with unspecified severity: Secondary | ICD-10-CM | POA: Diagnosis present

## 2015-06-16 DIAGNOSIS — Z79899 Other long term (current) drug therapy: Secondary | ICD-10-CM

## 2015-06-16 DIAGNOSIS — Z7982 Long term (current) use of aspirin: Secondary | ICD-10-CM

## 2015-06-16 DIAGNOSIS — L03115 Cellulitis of right lower limb: Principal | ICD-10-CM | POA: Diagnosis present

## 2015-06-16 LAB — CBC
HCT: 32.2 % — ABNORMAL LOW (ref 35.0–47.0)
HEMOGLOBIN: 10.6 g/dL — AB (ref 12.0–16.0)
MCH: 26.1 pg (ref 26.0–34.0)
MCHC: 32.8 g/dL (ref 32.0–36.0)
MCV: 79.6 fL — ABNORMAL LOW (ref 80.0–100.0)
Platelets: 152 10*3/uL (ref 150–440)
RBC: 4.05 MIL/uL (ref 3.80–5.20)
RDW: 17.6 % — ABNORMAL HIGH (ref 11.5–14.5)
WBC: 5.6 10*3/uL (ref 3.6–11.0)

## 2015-06-16 LAB — GLUCOSE, CAPILLARY: GLUCOSE-CAPILLARY: 152 mg/dL — AB (ref 65–99)

## 2015-06-16 LAB — BASIC METABOLIC PANEL
ANION GAP: 7 (ref 5–15)
BUN: 30 mg/dL — ABNORMAL HIGH (ref 6–20)
CHLORIDE: 100 mmol/L — AB (ref 101–111)
CO2: 29 mmol/L (ref 22–32)
Calcium: 9.1 mg/dL (ref 8.9–10.3)
Creatinine, Ser: 1.51 mg/dL — ABNORMAL HIGH (ref 0.44–1.00)
GFR calc non Af Amer: 36 mL/min — ABNORMAL LOW (ref >60)
GFR, EST AFRICAN AMERICAN: 42 mL/min — AB (ref >60)
Glucose, Bld: 164 mg/dL — ABNORMAL HIGH (ref 65–99)
POTASSIUM: 4.9 mmol/L (ref 3.5–5.1)
SODIUM: 136 mmol/L (ref 135–145)

## 2015-06-16 MED ORDER — CIPROFLOXACIN IN D5W 400 MG/200ML IV SOLN
400.0000 mg | Freq: Once | INTRAVENOUS | Status: AC
  Administered 2015-06-17: 400 mg via INTRAVENOUS
  Filled 2015-06-16: qty 200

## 2015-06-16 NOTE — ED Provider Notes (Signed)
[facility] Emergency Department Provider Note  ____________________________________________  Time seen: Approximately [id number] PM  I have reviewed the triage vital signs and the nursing notes.   HISTORY  Chief Complaint Toe Pain    HPI [name] is a 59 y.o. female with a history of diabetes and cellulitis to her left lower extremity who is presenting with redness and increased swelling and pain to her right second toe. She says that she was seen yesterday at the wound care center and prescribed doxycycline. However, despite taking 2 doses of the antibiotic she is had worsening symptoms with erythema now encroaching onto her right foot with some mild to moderate swelling. Denies any fevers at home. Has required hyperbaric oxygen treatment for wound care as well as osteomyelitis to the left foot in the past. Also required a PICC line and one time for this.   Past Medical History  Diagnosis Date  . Obesity, unspecified   . Unspecified vitamin D deficiency   . Pure hypercholesterolemia   . Neuropathy 2011  . Chicken pox   . Chronic systolic CHF (congestive heart failure)     a. mixed ICM & NICM; b. EF 20-25% by echo 07/2012, mid-dist 2/3 of LV sev HK/AK, mild MR. echo 10/2012: EF 30-35%, sev HK ant-septal & inf walls, GR1DD, mild MR, PASP 33. c. echo 02/2013: EF 30%, GR1DD, mild MR. echo 04/2014: EF 30%, Septal-lat dyssynchrony, global HK, inf AK, GR1DD, mild MR. d. echo 10/2014: EF50-55%, WM nl, GR1DD, septal mild paradox. e. echo 02/2015: EF 50-55%, wm   . Hypertension   . Anemia   . Automatic implantable cardioverter-defibrillator in situ     a. MDT CRT-D 06/2014, [id number]    . Type II diabetes mellitus   . CAD (coronary artery disease)     a. cardiac cath 101/04/2012: PCI/DES to chronically occluded mLAD, consideration PCI to diag branch in 4 weeks.   Marland Kitchen History of blood transfusion ~ 2011    "plasma; had neuropathy; couldn't walk"  . OSA on CPAP      Moderate with AHI 23/hr and now on CPAP at 16cm H2O.  Her DME is [facility]  . LBBB (left bundle branch block)     Patient Active Problem List   Diagnosis Date Noted  . Heel ulcer 04/27/2015  . Diabetes mellitus, type 2 04/27/2015  . GIB (gastrointestinal bleeding) 02/17/2015  . Acute osteomyelitis involving ankle and foot 02/06/2015  . Polyneuropathy 01/07/2015  . OSA (obstructive sleep apnea) 08/15/2014  . Chronic left ventricular systolic dysfunction 0000000  . Morbid obesity 10/26/2013  . Routine general medical examination at a health care facility 04/29/2013  . Diarrhea 04/29/2013  . Essential hypertension, benign 04/29/2013  . Renal insufficiency 04/19/2013  . Type II or unspecified type diabetes mellitus without mention of complication, not stated as uncontrolled 12/07/2012  . Pure hypercholesterolemia 12/07/2012  . Iron deficiency anemia 12/07/2012  . Diabetic peripheral neuropathy associated with type 2 diabetes mellitus 12/07/2012  . Chronic systolic heart failure [id number]  . Left bundle branch block 07/25/2012  . CAD (coronary artery disease) 07/25/2012  . Acute CHF (congestive heart failure) 07/21/2012  . Diabetes mellitus type 2 in obese 07/21/2012  . Hypoxia 07/21/2012  . HLD (hyperlipidemia) 07/21/2012  . Acute systolic heart failure [id number]  . Acute myocardial infarction, subendocardial infarction, initial episode of care 07/21/2012    Past Surgical History  Procedure Laterality Date  . Lumbar disc surgery  1996    L4-5  .  Incision and drainage abscess Right 2007    groin; with ICU stay due to sepsis.  . Bilateral oophorectomy  01/2011    ovarian cyst benign  . Bi-ventricular implantable cardioverter defibrillator  (crt-d)  07/06/2014  . Laparoscopic cholecystectomy  2011  . Back surgery    . Coronary angioplasty with stent placement Left 07/2012    new onset systolic CHF; elevated troponins.  Cardiac catheterization with stenting to LAD; EF 15%.   2D-echo: EF 20-25%.  . Vaginal hysterectomy  01/2011    Fibroids/DUB.  Ovaries removed. Fontaine.  . Tubal ligation  1981  . Left heart catheterization with coronary angiogram N/A 07/21/2012    Procedure: LEFT HEART CATHETERIZATION WITH CORONARY ANGIOGRAM;  Surgeon: Jolaine Artist, MD;  Location: [facility] CATH LAB;  Service: Cardiovascular;  Laterality: N/A;  . Percutaneous coronary stent intervention (pci-s) N/A 07/23/2012    Procedure: PERCUTANEOUS CORONARY STENT INTERVENTION (PCI-S);  Surgeon: Sherren Mocha, MD;  Location: [facility] CATH LAB;  Service: Cardiovascular;  Laterality: N/A;  . Bi-ventricular implantable cardioverter defibrillator N/A 07/06/2014    Procedure: BI-VENTRICULAR IMPLANTABLE CARDIOVERTER DEFIBRILLATOR  (CRT-D);  Surgeon: Deboraha Sprang, MD;  Location: [facility] CATH LAB;  Service: Cardiovascular;  Laterality: N/A;  . Colonoscopy with propofol Left 02/22/2015    Procedure: COLONOSCOPY WITH PROPOFOL;  Surgeon: Hulen Luster, MD;  Location[facility] ENDOSCOPY;  Service: Endoscopy;  Laterality: Left;  . Esophagogastroduodenoscopy N/A 02/22/2015    Procedure: ESOPHAGOGASTRODUODENOSCOPY (EGD);  Surgeon: Hulen Luster, MD;  Location: [facility] ENDOSCOPY;  Service: Endoscopy;  Laterality: N/A;  . Peripheral vascular catheterization N/A 02/10/2015    Procedure: Picc Line Insertion;  Surgeon: Katha Cabal, MD;  Location: [facility] CV LAB;  Service: Cardiovascular;  Laterality: N/A;    Current Outpatient Rx  Name  Route  Sig  Dispense  Refill  . aspirin EC 81 MG tablet   Oral   Take 1 tablet (81 mg total) by mouth daily.   90 tablet   3   . carvedilol (COREG) 6.25 MG tablet   Oral   Take 4 tablets (25 mg total) by mouth 2 (two) times daily with a meal.   60 tablet   0   . DOXYCYCLINE PO   Oral   Take by mouth.         . gabapentin (NEURONTIN) 600 MG tablet   Oral   Take 1 tablet (600 mg total) by mouth 3 (three) times daily.   270 tablet   3   . Insulin Glargine (LANTUS) 100 UNIT/ML  Solostar Pen   Subcutaneous   Inject 10 Units into the skin daily at 10 pm.   15 mL   11   . isosorbide mononitrate (IMDUR) 60 MG 24 hr tablet   Oral   Take 1 tablet (60 mg total) by mouth daily.   90 tablet   3   . nitroGLYCERIN (NITROSTAT) 0.4 MG SL tablet   Sublingual   Place 1 tablet (0.4 mg total) under the tongue every 5 (five) minutes as needed for chest pain.   30 tablet   0   . nortriptyline (PAMELOR) 50 MG capsule      TAKE 1 CAPSULE BY MOUTH EVERY NIGHT AT BEDTIME   30 capsule   2   . nystatin (MYCOSTATIN) 100000 UNIT/ML suspension   Oral   Take 5 mLs (500,000 Units total) by mouth 4 (four) times daily.   200 mL   0   . pravastatin (PRAVACHOL) 40 MG tablet  Oral   Take 1 tablet (40 mg total) by mouth at bedtime.   90 tablet   3   . sitaGLIPtin (JANUVIA) 100 MG tablet   Oral   Take 1 tablet (100 mg total) by mouth daily.   90 tablet   0   . torsemide (DEMADEX) 20 MG tablet   Oral   Take 2 tablets (40 mg total) by mouth daily.   60 tablet   3     Please cancel all previous orders for current medi .Marland KitchenMarland Kitchen     Allergies Review of patient's allergies indicates no known allergies.  Family History  Problem Relation Age of Onset  . Diabetes Mother   . Hypertension Mother   . Arthritis Mother     knees, lumbar DDD, cervical DDD  . Cancer Father     prostate,skin,lymphoma.  . Obesity Brother   . Diabetes Maternal Grandmother   . Diabetes Paternal Grandmother     Social History Social History  Substance Use Topics  . Smoking status: Never Smoker   . Smokeless tobacco: Never Used  . Alcohol Use: No    Review of Systems Constitutional: No fever/chills Eyes: No visual changes. ENT: No sore throat. Cardiovascular: Denies chest pain. Respiratory: Denies shortness of breath. Gastrointestinal: No abdominal pain.  No nausea, no vomiting.  No diarrhea.  No constipation. Genitourinary: Negative for dysuria. Musculoskeletal: Negative for back  pain. Skin: Negative for rash. Neurological: Negative for headaches, focal weakness or numbness.  10-point ROS otherwise negative.  ____________________________________________   PHYSICAL EXAM:  VITAL SIGNS: ED Triage Vitals  Enc Vitals Group     BP 06/16/15 1927 118/67 mmHg     Pulse Rate 06/16/15 1927 92     Resp 06/16/15 1927 18     Temp 06/16/15 1925 98.2 F (36.8 C)     Temp Source 06/16/15 1925 Oral     SpO2 06/16/15 1927 98 %     Weight 06/16/15 1927 228 lb (103.42 kg)     Height 06/16/15 1927 5\' 8"  (1.727 m)     Head Cir --      Peak Flow --      Pain Score 06/16/15 1928 0     Pain Loc --      Pain Edu? --      Excl. in [place]? --     Constitutional: Alert and oriented. Well appearing and in no acute distress. Eyes: Conjunctivae are normal. PERRL. EOMI. Head: Atraumatic. Nose: No congestion/rhinnorhea. Mouth/Throat: Mucous membranes are moist.  Oropharynx non-erythematous. Neck: No stridor.   Cardiovascular: Normal rate, regular rhythm. Grossly normal heart sounds.  Good peripheral circulation. Respiratory: Normal respiratory effort.  No retractions. Lungs CTAB. Gastrointestinal: Soft and nontender. No distention. No abdominal bruits. No CVA tenderness. Musculoskeletal: Edema to bilateral lower extremities but increased to the right ankle and foot. There is also erythema to the dorsum of the right foot. Dorsalis pedis pulses are present and equal bilaterally. Neurologic:  Normal speech and language. No gross focal neurologic deficits are appreciated. No gait instability. Skin:  Right second toe with swelling as well as erythema. She is able to range the toe and it is sensate. This erythema extends onto the dorsum of the foot. Induration overlying the skin of the second toe however does not extend onto the foot. Psychiatric: Mood and affect are normal. Speech and behavior are normal.  ____________________________________________   LABS (all labs ordered are listed,  but only abnormal results are displayed)  Labs Reviewed  CBC - Abnormal; Notable for the following:    Hemoglobin 10.6 (*)    HCT 32.2 (*)    MCV 79.6 (*)    RDW 17.6 (*)    All other components within normal limits  BASIC METABOLIC PANEL - Abnormal; Notable for the following:    Chloride 100 (*)    Glucose, Bld 164 (*)    BUN 30 (*)    Creatinine, Ser 1.51 (*)    GFR calc non Af Amer 36 (*)    GFR calc Af Amer 42 (*)    All other components within normal limits  GLUCOSE, CAPILLARY - Abnormal; Notable for the following:    Glucose-Capillary 152 (*)    All other components within normal limits   ____________________________________________  EKG   ____________________________________________  RADIOLOGY  X-ray from 06/15/2015 showing osteomyelitis to the right second toe. ____________________________________________   PROCEDURES    ____________________________________________   INITIAL IMPRESSION / ASSESSMENT AND PLAN / ED COURSE  Pertinent labs & imaging results that were available during my care of the patient were reviewed by me and considered in my medical decision making (see chart for details).  ----------------------------------------- 11:41 PM on 06/16/2015 -----------------------------------------  We'll admit the patient to the hospital for treatment of her osteomyelitis and cellulitis. Will start on IV antibiotics. Signed out to Dr. Lavetta Nielsen. ____________________________________________   FINAL CLINICAL IMPRESSION(S) / ED DIAGNOSES  Acute osteomyelitis with overlying cellulitis of the right lower extremity. Initial visit.    Orbie Pyo, MD 06/16/15 [id number]

## 2015-06-16 NOTE — ED Notes (Signed)
Pt states she was seeing a wound care center for her toe pt states yesterday it got worse and was leaking fluid. Pt states xray showed infection into the bone and was supposed to have ultrasound this coming week

## 2015-06-16 NOTE — Progress Notes (Signed)
Note routed

## 2015-06-16 NOTE — ED Notes (Addendum)
Pt states she has been going to the wound center for evaluation of diabetic ulcer on her foot & changes dressings daily. Pt says she was there yesterday and it was red (right foot, second and third toe). Today, it is more swollen and the redness extends up through her right foot.   Pt did have xray complete yesterday "Findings compatible with osteomyelitis of the tuft of the distal phalanx of the second toe. There is overlying soft tissue swelling." and is suppose to have ultrasound on Tuesday.

## 2015-06-17 LAB — GLUCOSE, CAPILLARY
Glucose-Capillary: 122 mg/dL — ABNORMAL HIGH (ref 65–99)
Glucose-Capillary: 170 mg/dL — ABNORMAL HIGH (ref 65–99)
Glucose-Capillary: 221 mg/dL — ABNORMAL HIGH (ref 65–99)
Glucose-Capillary: 105 mg/dL — ABNORMAL HIGH (ref 65–99)
Glucose-Capillary: 198 mg/dL — ABNORMAL HIGH (ref 65–99)

## 2015-06-17 LAB — HEMOGLOBIN A1C: Hgb A1c MFr Bld: 8.2 % — ABNORMAL HIGH (ref 4.0–6.0)

## 2015-06-17 LAB — TSH: TSH: 3.475 u[IU]/mL (ref 0.350–4.500)

## 2015-06-17 MED ORDER — MORPHINE SULFATE (PF) 2 MG/ML IV SOLN: 1.0000 mg | INTRAVENOUS | Status: DC | PRN

## 2015-06-17 MED ORDER — VANCOMYCIN HCL 10 G IV SOLR
1250.0000 mg | Freq: Once | INTRAVENOUS | Status: AC
  Administered 2015-06-17: 1250 mg via INTRAVENOUS
  Filled 2015-06-17: qty 1250

## 2015-06-17 MED ORDER — ASPIRIN EC 81 MG PO TBEC
81.0000 mg | DELAYED_RELEASE_TABLET | Freq: Every day | ORAL | Status: DC
  Administered 2015-06-17 – 2015-06-19 (×3): 81 mg via ORAL
  Filled 2015-06-17 (×3): qty 1

## 2015-06-17 MED ORDER — PRAVASTATIN SODIUM 20 MG PO TABS
40.0000 mg | ORAL_TABLET | Freq: Every day | ORAL | Status: DC
  Administered 2015-06-17 – 2015-06-18 (×3): 40 mg via ORAL
  Filled 2015-06-17 (×3): qty 2

## 2015-06-17 MED ORDER — OMEGA-3-ACID ETHYL ESTERS 1 G PO CAPS
1.0000 g | ORAL_CAPSULE | Freq: Every day | ORAL | Status: DC
  Administered 2015-06-17 – 2015-06-19 (×3): 1 g via ORAL
  Filled 2015-06-17 (×3): qty 1

## 2015-06-17 MED ORDER — TORSEMIDE 20 MG PO TABS
40.0000 mg | ORAL_TABLET | Freq: Every day | ORAL | Status: DC
  Administered 2015-06-18: 40 mg via ORAL
  Filled 2015-06-17 (×3): qty 2

## 2015-06-17 MED ORDER — ISOSORBIDE MONONITRATE ER 60 MG PO TB24
60.0000 mg | ORAL_TABLET | Freq: Every day | ORAL | Status: DC
Start: 2015-06-17 — End: 2015-06-19
  Administered 2015-06-17 – 2015-06-19 (×3): 60 mg via ORAL
  Filled 2015-06-17 (×3): qty 1

## 2015-06-17 MED ORDER — GABAPENTIN 600 MG PO TABS
600.0000 mg | ORAL_TABLET | Freq: Three times a day (TID) | ORAL | Status: DC
  Administered 2015-06-17 – 2015-06-19 (×6): 600 mg via ORAL
  Filled 2015-06-17 (×6): qty 1

## 2015-06-17 MED ORDER — PNEUMOCOCCAL VAC POLYVALENT 25 MCG/0.5ML IJ INJ
0.5000 mL | INJECTION | INTRAMUSCULAR | Status: AC
  Administered 2015-06-18: 0.5 mL via INTRAMUSCULAR
  Filled 2015-06-17: qty 0.5

## 2015-06-17 MED ORDER — NYSTATIN 100000 UNIT/ML MT SUSP
5.0000 mL | Freq: Four times a day (QID) | OROMUCOSAL | Status: DC
  Filled 2015-06-17 (×4): qty 5

## 2015-06-17 MED ORDER — LINAGLIPTIN 5 MG PO TABS
5.0000 mg | ORAL_TABLET | Freq: Every day | ORAL | Status: DC
  Administered 2015-06-17 – 2015-06-19 (×3): 5 mg via ORAL
  Filled 2015-06-17 (×3): qty 1

## 2015-06-17 MED ORDER — DOCUSATE SODIUM 100 MG PO CAPS
100.0000 mg | ORAL_CAPSULE | Freq: Two times a day (BID) | ORAL | Status: DC
  Administered 2015-06-17 – 2015-06-19 (×6): 100 mg via ORAL
  Filled 2015-06-17 (×6): qty 1

## 2015-06-17 MED ORDER — INFLUENZA VAC SPLIT QUAD 0.5 ML IM SUSY
0.5000 mL | PREFILLED_SYRINGE | INTRAMUSCULAR | Status: AC
  Administered 2015-06-18: 0.5 mL via INTRAMUSCULAR
  Filled 2015-06-17: qty 0.5

## 2015-06-17 MED ORDER — TRAMADOL HCL 50 MG PO TABS
50.0000 mg | ORAL_TABLET | Freq: Four times a day (QID) | ORAL | Status: DC | PRN
  Administered 2015-06-17 – 2015-06-18 (×2): 50 mg via ORAL
  Filled 2015-06-17 (×2): qty 1

## 2015-06-17 MED ORDER — HEPARIN SODIUM (PORCINE) 5000 UNIT/ML IJ SOLN
5000.0000 [IU] | Freq: Three times a day (TID) | INTRAMUSCULAR | Status: DC
  Administered 2015-06-17 – 2015-06-19 (×5): 5000 [IU] via SUBCUTANEOUS
  Filled 2015-06-17 (×5): qty 1

## 2015-06-17 MED ORDER — NITROGLYCERIN 0.4 MG SL SUBL: 0.4000 mg | SUBLINGUAL_TABLET | SUBLINGUAL | Status: DC | PRN

## 2015-06-17 MED ORDER — ONDANSETRON HCL 4 MG PO TABS: 4.0000 mg | ORAL_TABLET | Freq: Four times a day (QID) | ORAL | Status: DC | PRN

## 2015-06-17 MED ORDER — INSULIN GLARGINE 100 UNIT/ML ~~LOC~~ SOLN
8.0000 [IU] | Freq: Every day | SUBCUTANEOUS | Status: DC
  Administered 2015-06-17 – 2015-06-18 (×2): 8 [IU] via SUBCUTANEOUS
  Filled 2015-06-17 (×6): qty 0.08

## 2015-06-17 MED ORDER — ONDANSETRON HCL 4 MG/2ML IJ SOLN
4.0000 mg | Freq: Four times a day (QID) | INTRAMUSCULAR | Status: DC | PRN
Start: 2015-06-17 — End: 2015-06-19

## 2015-06-17 MED ORDER — SODIUM CHLORIDE 0.9 % IV SOLN
INTRAVENOUS | Status: DC
  Administered 2015-06-17: 03:00:00 via INTRAVENOUS

## 2015-06-17 MED ORDER — NORTRIPTYLINE HCL 25 MG PO CAPS
50.0000 mg | ORAL_CAPSULE | Freq: Every day | ORAL | Status: DC
  Administered 2015-06-17 – 2015-06-18 (×3): 50 mg via ORAL
  Filled 2015-06-17 (×3): qty 2

## 2015-06-17 MED ORDER — INSULIN ASPART 100 UNIT/ML ~~LOC~~ SOLN
0.0000 [IU] | Freq: Three times a day (TID) | SUBCUTANEOUS | Status: DC
  Administered 2015-06-17: 4 [IU] via SUBCUTANEOUS
  Administered 2015-06-17: 7 [IU] via SUBCUTANEOUS
  Administered 2015-06-18: 4 [IU] via SUBCUTANEOUS
  Administered 2015-06-18: 3 [IU] via SUBCUTANEOUS
  Administered 2015-06-19 (×2): 4 [IU] via SUBCUTANEOUS
  Filled 2015-06-17 (×4): qty 4
  Filled 2015-06-17: qty 3
  Filled 2015-06-17: qty 7

## 2015-06-17 MED ORDER — VANCOMYCIN HCL 10 G IV SOLR
1250.0000 mg | INTRAVENOUS | Status: DC
  Administered 2015-06-17 – 2015-06-18 (×2): 1250 mg via INTRAVENOUS
  Filled 2015-06-17 (×4): qty 1250

## 2015-06-17 MED ORDER — ACETAMINOPHEN 325 MG PO TABS: 650.0000 mg | ORAL_TABLET | Freq: Four times a day (QID) | ORAL | Status: DC | PRN

## 2015-06-17 MED ORDER — ACETAMINOPHEN 650 MG RE SUPP: 650.0000 mg | Freq: Four times a day (QID) | RECTAL | Status: DC | PRN

## 2015-06-17 NOTE — Plan of Care (Signed)
Problem: Phase I Progression Outcomes Goal: Pain controlled with appropriate interventions Outcome: Progressing No complaints of pain this shift Goal: OOB as tolerated unless otherwise ordered Outcome: Completed/Met Date Met:  06/17/15 Able to ambulate with assistance Goal: Voiding-avoid urinary catheter unless indicated Outcome: Completed/Met Date Met:  06/17/15 Voiding without difficulty

## 2015-06-17 NOTE — Progress Notes (Signed)
ANTIBIOTIC CONSULT NOTE - INITIAL  Pharmacy Consult for vancomycin dosing Indication: osteomyelitis  No Known Allergies  Patient Measurements: Height: 5\' 8"  (172.7 cm) Weight: 238 lb 6.4 oz (108.138 kg) IBW/kg (Calculated) : 63.9 Adjusted Body Weight: 80kg  Vital Signs: Temp: 97.9 F (36.6 C) (09/10 0435) Temp Source: Oral (09/10 0435) BP: 164/76 mmHg (09/10 0435) Pulse Rate: 85 (09/10 0435) Intake/Output from previous day:   Intake/Output from this shift:    Labs:  Recent Labs  06/16/15 1937  WBC 5.6  HGB 10.6*  PLT 152  CREATININE 1.51*   Estimated Creatinine Clearance: 50.4 mL/min (by C-G formula based on Cr of 1.51). No results for input(s): VANCOTROUGH, VANCOPEAK, VANCORANDOM, GENTTROUGH, GENTPEAK, GENTRANDOM, TOBRATROUGH, TOBRAPEAK, TOBRARND, AMIKACINPEAK, AMIKACINTROU, AMIKACIN in the last 72 hours.   Microbiology: No results found for this or any previous visit (from the past 720 hour(s)).  Medical History: Past Medical History  Diagnosis Date  . Obesity, unspecified   . Unspecified vitamin D deficiency   . Pure hypercholesterolemia   . Neuropathy 2011  . Chicken pox   . Chronic systolic CHF (congestive heart failure)     a. mixed ICM & NICM; b. EF 20-25% by echo 07/2012, mid-dist 2/3 of LV sev HK/AK, mild MR. echo 10/2012: EF 30-35%, sev HK ant-septal & inf walls, GR1DD, mild MR, PASP 33. c. echo 02/2013: EF 30%, GR1DD, mild MR. echo 04/2014: EF 30%, Septal-lat dyssynchrony, global HK, inf AK, GR1DD, mild MR. d. echo 10/2014: EF50-55%, WM nl, GR1DD, septal mild paradox. e. echo 02/2015: EF 50-55%, wm   . Hypertension   . Anemia   . Automatic implantable cardioverter-defibrillator in situ     a. MDT CRT-D 06/2014, [id number] H    . Type II diabetes mellitus   . CAD (coronary artery disease)     a. cardiac cath 101/04/2012: PCI/DES to chronically occluded mLAD, consideration PCI to diag branch in 4 weeks.   Marland Kitchen History of blood transfusion ~ 2011    "plasma;  had neuropathy; couldn't walk"  . OSA on CPAP     Moderate with AHI 23/hr and now on CPAP at 16cm H2O.  Her DME is [facility]  . LBBB (left bundle branch block)     Medications:   Assessment: No micro Osteomyelitis of toe on imaging  Goal of Therapy:  Vancomycin trough level 15-20 mcg/ml  Plan:  TBW 103.4kg  IBW 63.9kg  DW 80kg  Vd 56L kei 0.045 hr-1  t1/2 15 hours 1250 mg q 18 hours ordered with stacked dosing. Level before 5th dose.   Sim Boast, PharmD, BCPS  06/17/2015

## 2015-06-17 NOTE — Consult Note (Signed)
ORTHOPAEDIC CONSULTATION  REQUESTING PHYSICIAN: Epifanio Lesches, MD  Chief Complaint: Right second toe ulcer with osteomyelitis  HPI: [name] is a 59 y.o. female who complains of  infection of her right second toe. In dealing with a chronic ulcer to this area. In performing wound care at home as well as seeing the wound care center weekly. X-ray recently showed infection on the distal aspect of the second toe. Admitted after worsening redness and discoloration to the second toe. Had some chronic drainage from the area as well. She had undergone 40 hyperbaric oxygen treatments for Ulceration That Responded Well. She Had Previously Been on IV Antibiotics until July and Stopped Oral Cipro Approximately 3 Weeks Ago. Was seen in the wound care center this week and given a prescription for doxycycline and only was able to take 2 doses until the toe became worse. She's had a long-standing history of diabetes with severe neuropathy.  Past Medical History  Diagnosis Date  . Obesity, unspecified   . Unspecified vitamin D deficiency   . Pure hypercholesterolemia   . Neuropathy 2011  . Chicken pox   . Chronic systolic CHF (congestive heart failure)     a. mixed ICM & NICM; b. EF 20-25% by echo 07/2012, mid-dist 2/3 of LV sev HK/AK, mild MR. echo 10/2012: EF 30-35%, sev HK ant-septal & inf walls, GR1DD, mild MR, PASP 33. c. echo 02/2013: EF 30%, GR1DD, mild MR. echo 04/2014: EF 30%, Septal-lat dyssynchrony, global HK, inf AK, GR1DD, mild MR. d. echo 10/2014: EF50-55%, WM nl, GR1DD, septal mild paradox. e. echo 02/2015: EF 50-55%, wm   . Hypertension   . Anemia   . Automatic implantable cardioverter-defibrillator in situ     a. MDT CRT-D 06/2014, [id number]    . Type II diabetes mellitus   . CAD (coronary artery disease)     a. cardiac cath 101/04/2012: PCI/DES to chronically occluded mLAD, consideration PCI to diag branch in 4 weeks.   Marland Kitchen History of blood transfusion ~ 2011    "plasma; had  neuropathy; couldn't walk"  . OSA on CPAP     Moderate with AHI 23/hr and now on CPAP at 16cm H2O.  Her DME is [facility]  . LBBB (left bundle branch block)    Past Surgical History  Procedure Laterality Date  . Lumbar disc surgery  1996    L4-5  . Incision and drainage abscess Right 2007    groin; with ICU stay due to sepsis.  . Bilateral oophorectomy  01/2011    ovarian cyst benign  . Bi-ventricular implantable cardioverter defibrillator  (crt-d)  07/06/2014  . Laparoscopic cholecystectomy  2011  . Back surgery    . Coronary angioplasty with stent placement Left 07/2012    new onset systolic CHF; elevated troponins.  Cardiac catheterization with stenting to LAD; EF 15%.  2D-echo: EF 20-25%.  . Vaginal hysterectomy  01/2011    Fibroids/DUB.  Ovaries removed. Fontaine.  . Tubal ligation  1981  . Left heart catheterization with coronary angiogram N/A 07/21/2012    Procedure: LEFT HEART CATHETERIZATION WITH CORONARY ANGIOGRAM;  Surgeon: Jolaine Artist, MD;  Location: [facility] CATH LAB;  Service: Cardiovascular;  Laterality: N/A;  . Percutaneous coronary stent intervention (pci-s) N/A 07/23/2012    Procedure: PERCUTANEOUS CORONARY STENT INTERVENTION (PCI-S);  Surgeon: Sherren Mocha, MD;  Location: [facility] CATH LAB;  Service: Cardiovascular;  Laterality: N/A;  . Bi-ventricular implantable cardioverter defibrillator N/A 07/06/2014    Procedure: BI-VENTRICULAR IMPLANTABLE CARDIOVERTER DEFIBRILLATOR  (CRT-D);  Surgeon: Deboraha Sprang, MD;  Location: [facility] CATH LAB;  Service: Cardiovascular;  Laterality: N/A;  . Colonoscopy with propofol Left 02/22/2015    Procedure: COLONOSCOPY WITH PROPOFOL;  Surgeon: Hulen Luster, MD;  Location: [facility] ENDOSCOPY;  Service: Endoscopy;  Laterality: Left;  . Esophagogastroduodenoscopy N/A 02/22/2015    Procedure: ESOPHAGOGASTRODUODENOSCOPY (EGD);  Surgeon: Hulen Luster, MD;  Location: [facility] ENDOSCOPY;  Service: Endoscopy;  Laterality: N/A;  . Peripheral vascular catheterization N/A  02/10/2015    Procedure: Picc Line Insertion;  Surgeon: Katha Cabal, MD;  Location: [facility] CV LAB;  Service: Cardiovascular;  Laterality: N/A;   Social History   Social History  . Marital Status: Married    Spouse Name: N/A  . Number of Children: 2  . Years of Education: 12   Occupational History  . disabled     03/2010 for peripheral neuropathy  . home daycare     x 20 yrs.   Social History Main Topics  . Smoking status: Never Smoker   . Smokeless tobacco: Never Used  . Alcohol Use: No  . Drug Use: No  . Sexual Activity: No   Other Topics Concern  . None   Social History Narrative   Marital status:Married x 42 yrs. Happily married, no abuse.       Children:  2 children (daughter 56, son 8). One grandson and 2 step grandchildren. Daughter and grandson live in home with pt and spouse.       Lives: with husband.      Employment: disability for peripheral neuropathy 2012.  Previously had home daycare.      Tobacco: none      Alcohol: none      Drugs: none      Exercise: none.  [employer].      Pets: dog.      Always uses seat belts, smoke detectors in home.      No guns in the home.       Caffeine use: 2 cups coffee per day.       Nutrition: Well balanced diet.   Family History  Problem Relation Age of Onset  . Diabetes Mother   . Hypertension Mother   . Arthritis Mother     knees, lumbar DDD, cervical DDD  . Cancer Father     prostate,skin,lymphoma.  . Obesity Brother   . Diabetes Maternal Grandmother   . Diabetes Paternal Grandmother    No Known Allergies Prior to Admission medications   Medication Sig Start Date End Date Taking? Authorizing Provider  aspirin EC 81 MG tablet Take 1 tablet (81 mg total) by mouth daily. 03/31/14  Yes Wardell Honour, MD  doxycycline (DORYX) 100 MG EC tablet Take 100 mg by mouth 2 (two) times daily.   Yes Historical Provider, MD  gabapentin (NEURONTIN) 600 MG tablet Take 1 tablet (600 mg total) by mouth 3  (three) times daily. 03/31/14  Yes Wardell Honour, MD  Insulin Glargine (LANTUS) 100 UNIT/ML Solostar Pen Inject 10 Units into the skin daily at 10 pm. 04/27/15  Yes Wardell Honour, MD  isosorbide mononitrate (IMDUR) 60 MG 24 hr tablet Take 1 tablet (60 mg total) by mouth daily. 12/12/14  Yes Jolaine Artist, MD  nortriptyline (PAMELOR) 50 MG capsule TAKE 1 CAPSULE BY MOUTH EVERY NIGHT AT BEDTIME 05/24/15  Yes Wardell Honour, MD  Omega-3 Fatty Acids (FISH OIL) 1000 MG CAPS Take 100 mg by mouth 2 (two) times daily.  Yes Historical Provider, MD  pravastatin (PRAVACHOL) 40 MG tablet Take 1 tablet (40 mg total) by mouth at bedtime. 03/31/14  Yes Wardell Honour, MD  sitaGLIPtin (JANUVIA) 100 MG tablet Take 1 tablet (100 mg total) by mouth daily. Patient taking differently: Take 50 mg by mouth daily.  01/10/15  Yes Leandrew Koyanagi, MD  carvedilol (COREG) 6.25 MG tablet Take 4 tablets (25 mg total) by mouth 2 (two) times daily with a meal. 05/02/15   Roselee Culver, MD  nitroGLYCERIN (NITROSTAT) 0.4 MG SL tablet Place 1 tablet (0.4 mg total) under the tongue every 5 (five) minutes as needed for chest pain. 07/26/12   Coral Spikes, DO  nystatin (MYCOSTATIN) 100000 UNIT/ML suspension Take 5 mLs (500,000 Units total) by mouth 4 (four) times daily. 04/27/15   Wardell Honour, MD  torsemide (DEMADEX) 20 MG tablet Take 2 tablets (40 mg total) by mouth daily. 12/19/14   Jolaine Artist, MD   No results found.  Positive ROS: All other systems have been reviewed and were otherwise negative with the exception of those mentioned in the HPI and as above.  12 point ROS was performed.  Physical Exam: General: Alert and oriented.  No apparent distress.  Vascular:  Left foot:Dorsalis Pedis:  present Posterior Tibial:  present  Right foot: Dorsalis Pedis:  present Posterior Tibial:  present  Neuro:absent gross or light touch sensation  Derm: The left heel and very scant amount of hyperkeratotic tissue from a  previous ulceration. 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  The right second toe is extremely erythematous. There is a noted open ulceration that probes directly to bone on the distal aspect of the right second toe. There is noted purulent drainage from this toe ulcerative site. No severe lymphangitic streaking. This extends from the tip of the toe to the metatarsophalangeal joint region. I can probe directly to the tip of the second toe but not proximal to this area.  Ortho/MS: Bilateral lower extremity edema is noted. Edema to the second toe was noted.   CBC Latest Ref Rng 06/16/2015 04/27/2015 03/19/2015  WBC 3.6 - 11.0 K/uL 5.6 5.2 4.0  Hemoglobin 12.0 - 16.0 g/dL 10.6(L) 10.2(A) 9.7(L)  Hematocrit 35.0 - 47.0 % 32.2(L) 32.4(A) 30.1(L)  Platelets 150 - 440 K/uL 152 - 247   X-ray from 06/15/2015: CLINICAL DATA: Two months of nonhealing wound of the right second toe, history of diabetes, recently finished hyperbaric treatment for left foot wounds.  EXAM: RIGHT FOOT COMPLETE - 3+ VIEW  COMPARISON: None in PACs  FINDINGS: There is ostial ice this of the tuft of the distal phalanx of the second toe. There is surrounding soft tissue swelling. As best as can be determined the joint space is preserved. The proximal and middle phalanges are intact. The other toes exhibit no ostial ice is. There are mild degenerative changes of the first MTP joint. There is a large plantar calcaneal spur.  IMPRESSION: Findings compatible with osteomyelitis of the tuft of the distal phalanx of the second toe. There is overlying soft tissue swelling.   Electronically Signed  By: David Martinique M.D.  On: 06/15/2015 10:44  Reviewed the findings and ostial lysis of the distal tuft of the second toe is noted. Agree with lysis of the distal aspect of the second toe.  Assessment: Osteomyelitis right second toe Diabetes with neuropathy History  of left heel ulceration with osteomyelitis  Plan: I had a long discussion with the patient concerning her possible treatments. Just local wound care with long-term antibiotics as well as the possibility of distal amputation of the second toe. I discussed the risks and benefits associated with it. My suggestion was placed in the second toe at the joint would be to joints proximal to the infection. Could likely discharged on by mouth antibiotics with some local wound care. She has good circulation and heal this up without incidence. As always there are risks of continued infection and nonhealing and need for further surgery down the road. She has decided to go ahead with a toe amputation. We will attempt to place this on the OR schedule for tomorrow. We'll make her nothing by mouth after midnight tonight.    Elesa Hacker, DPM Cell [phone]   06/17/2015 1:53 PM

## 2015-06-17 NOTE — Progress Notes (Signed)
[facility] [place] NAME: [name]    MR#:  [id number]  DATE OF BIRTH:  05/30/1956  SUBJECTIVE: 59 year old female patient with history of type 2 diabetes mellitus, history of osteomyelitis, obesity, chronic systolic heart failure, hypertension, diabetes type 2, comes in secondary to right second toe  swelling, redness, possible osteo myelitis. Patient was prescribed doxycycline as an outpatient but she took only 2 tablets. Came in because of  worsening cellulitis. Patient started on IV antibiotics.   CHIEF COMPLAINT:   Chief Complaint  Patient presents with  . Toe Pain    REVIEW OF SYSTEMS:   ROS CONSTITUTIONAL: No fever, fatigue or weakness.  EYES: No blurred or double vision.  EARS, NOSE, AND THROAT: No tinnitus or ear pain.  RESPIRATORY: No cough, shortness of breath, wheezing or hemoptysis.  CARDIOVASCULAR: No chest pain, orthopnea, edema.  GASTROINTESTINAL: No nausea, vomiting, diarrhea or abdominal pain.  GENITOURINARY: No dysuria, hematuria.  ENDOCRINE: No polyuria, nocturia,  HEMATOLOGY: No anemia, easy bruising or bleeding SKIN: No rash or lesion. MUSCULOSKELETAL: Swelling and redness of the right second toe. And extending into the dorsum of the right foot.  NEUROLOGIC: No tingling, numbness, weakness.  PSYCHIATRY: No anxiety or depression.   DRUG ALLERGIES:  No Known Allergies  VITALS:  Blood pressure 150/73, pulse 90, temperature 97.7 F (36.5 C), temperature source Oral, resp. rate 16, height 5\' 8"  (1.727 m), weight 108.138 kg (238 lb 6.4 oz), SpO2 99 %.  PHYSICAL EXAMINATION:  GENERAL:  59 y.o.-year-old patient lying in the bed with no acute distress.  EYES: Pupils equal, round, reactive to light and accommodation. No scleral icterus. Extraocular muscles intact.  HEENT: Head atraumatic, normocephalic. Oropharynx and nasopharynx clear.  NECK:  Supple, no jugular venous distention. No thyroid enlargement,  no tenderness.  LUNGS: Normal breath sounds bilaterally, no wheezing, rales,rhonchi or crepitation. No use of accessory muscles of respiration.  CARDIOVASCULAR: S1, S2 normal. No murmurs, rubs, or gallops.  ABDOMEN: Soft, nontender, nondistended. Bowel sounds present. No organomegaly or mass.  EXTREMITIES: Swelling of the right second toe going into the dorsum of the right foot. No tenderness.  dorsalis pedis pulse.palpable. NEUROLOGIC: Cranial nerves II through XII are intact. Muscle strength 5/5 in all extremities. Sensation intact. Gait not checked.  PSYCHIATRIC: The patient is alert and oriented x 3.  SKIN: No obvious rash, lesion, or ulcer.    LABORATORY PANEL:   CBC  Recent Labs Lab 06/16/15 1937  WBC 5.6  HGB 10.6*  HCT 32.2*  PLT 152   ------------------------------------------------------------------------------------------------------------------  Chemistries   Recent Labs Lab 06/16/15 1937  NA 136  K 4.9  CL 100*  CO2 29  GLUCOSE 164*  BUN 30*  CREATININE 1.51*  CALCIUM 9.1   ------------------------------------------------------------------------------------------------------------------  Cardiac Enzymes No results for input(s): TROPONINI in the last 168 hours. ------------------------------------------------------------------------------------------------------------------  RADIOLOGY:  Dg Foot Complete Right  06/15/2015   CLINICAL DATA:  Two months of nonhealing wound of the right second toe, history of diabetes, recently finished hyperbaric treatment for left foot wounds.  EXAM: RIGHT FOOT COMPLETE - 3+ VIEW  COMPARISON:  None in PACs  FINDINGS: There is ostial ice this of the tuft of the distal phalanx of the second toe. There is surrounding soft tissue swelling. As best as can be determined the joint space is preserved. The proximal and middle phalanges are intact. The other toes exhibit no ostial ice is. There are mild degenerative changes of the first MTP  joint. There is a large plantar calcaneal spur.  IMPRESSION: Findings compatible with osteomyelitis of the tuft of the distal phalanx of the second toe. There is overlying soft tissue swelling.   Electronically Signed   By: David  Martinique M.D.   On: 06/15/2015 10:44    EKG:   Orders placed or performed in visit on 03/17/15  . EKG 12-Lead    ASSESSMENT AND PLAN:    1.Right second toe osteomyelitis: Continue vancomycin, Cipro..  The consult, ID consult placed. #2 diabetes mellitus type 2: Continue strict glycemic control., Continue Lantus, mealtime insulin. And adjust #3 chronic systolic heart failure stable at this time continue, continue Coreg,.torsemide. Patient is status post ICD. A modest continue IV fluids because of fall history of fheart failure, there is no need for IV fluids at this time as patient is not hypotensive, no fever, no WBC. Volume status is good. #4 Hyperlipidemia: Continue statins.    All the records are reviewed and case discussed with Care Management/Social Workerr. Management plans discussed with the patient, family and they are in agreement.  CODE STATUS: Full  TOTAL TIME TAKING CARE OF THIS PATIENT: 11minutes.   POSSIBLE D/C IN 1-3 DAYS, DEPENDING ON CLINICAL CONDITION.   Epifanio Lesches M.D on 06/17/2015 at 9:02 AM  Between 7am to 6pm - Pager - [phone]  After 6pm go to [email or web address] - password [email or web address] [facility]  [facility]  Office  [phone]  CC: Primary care physician; [name], MD

## 2015-06-17 NOTE — H&P (Signed)
[name] is an 59 y.o. female.   Chief Complaint: Toe pain HPI: The patient with past medical history significant for diabetes mellitus type 2 and history of osteomyelitis of the left foot presents to the emergency department complaining of toe pain in her right second digit. She's also noticed that it is swollen and become red. She denies fever, nausea, vomiting or diarrhea. She was seen at the wound clinic yesterday for wound on the end of the toe and was recently prescribed doxycycline but is concerned the toe looks worse. In the emergency department the swollen digit and surrounding cellulitis prompted the emergency department staff to call for admission.  Past Medical History  Diagnosis Date  . Obesity, unspecified   . Unspecified vitamin D deficiency   . Pure hypercholesterolemia   . Neuropathy 2011  . Chicken pox   . Chronic systolic CHF (congestive heart failure)     a. mixed ICM & NICM; b. EF 20-25% by echo 07/2012, mid-dist 2/3 of LV sev HK/AK, mild MR. echo 10/2012: EF 30-35%, sev HK ant-septal & inf walls, GR1DD, mild MR, PASP 33. c. echo 02/2013: EF 30%, GR1DD, mild MR. echo 04/2014: EF 30%, Septal-lat dyssynchrony, global HK, inf AK, GR1DD, mild MR. d. echo 10/2014: EF50-55%, WM nl, GR1DD, septal mild paradox. e. echo 02/2015: EF 50-55%, wm   . Hypertension   . Anemia   . Automatic implantable cardioverter-defibrillator in situ     a. MDT CRT-D 06/2014, SN: [id number]    . Type II diabetes mellitus   . CAD (coronary artery disease)     a. cardiac cath 101/04/2012: PCI/DES to chronically occluded mLAD, consideration PCI to diag branch in 4 weeks.   Marland Kitchen History of blood transfusion ~ 2011    "plasma; had neuropathy; couldn't walk"  . OSA on CPAP     Moderate with AHI 23/hr and now on CPAP at 16cm H2O.  Her DME is [facility]  . LBBB (left bundle branch block)     Past Surgical History  Procedure Laterality Date  . Lumbar disc surgery  1996    L4-5  . Incision and drainage abscess  Right 2007    groin; with ICU stay due to sepsis.  . Bilateral oophorectomy  01/2011    ovarian cyst benign  . Bi-ventricular implantable cardioverter defibrillator  (crt-d)  07/06/2014  . Laparoscopic cholecystectomy  2011  . Back surgery    . Coronary angioplasty with stent placement Left 07/2012    new onset systolic CHF; elevated troponins.  Cardiac catheterization with stenting to LAD; EF 15%.  2D-echo: EF 20-25%.  . Vaginal hysterectomy  01/2011    Fibroids/DUB.  Ovaries removed. Fontaine.  . Tubal ligation  1981  . Left heart catheterization with coronary angiogram N/A 07/21/2012    Procedure: LEFT HEART CATHETERIZATION WITH CORONARY ANGIOGRAM;  Surgeon: Jolaine Artist, MD;  Location: [facility] CATH LAB;  Service: Cardiovascular;  Laterality: N/A;  . Percutaneous coronary stent intervention (pci-s) N/A 07/23/2012    Procedure: PERCUTANEOUS CORONARY STENT INTERVENTION (PCI-S);  Surgeon: Sherren Mocha, MD;  Location: [facility] CATH LAB;  Service: Cardiovascular;  Laterality: N/A;  . Bi-ventricular implantable cardioverter defibrillator N/A 07/06/2014    Procedure: BI-VENTRICULAR IMPLANTABLE CARDIOVERTER DEFIBRILLATOR  (CRT-D);  Surgeon: Deboraha Sprang, MD;  Location: [facility] CATH LAB;  Service: Cardiovascular;  Laterality: N/A;  . Colonoscopy with propofol Left 02/22/2015    Procedure: COLONOSCOPY WITH PROPOFOL;  Surgeon: Hulen Luster, MD;  Location: [facility] ENDOSCOPY;  Service: Endoscopy;  Laterality: Left;  . Esophagogastroduodenoscopy N/A 02/22/2015    Procedure: ESOPHAGOGASTRODUODENOSCOPY (EGD);  Surgeon: Hulen Luster, MD;  Location: [facility] ENDOSCOPY;  Service: Endoscopy;  Laterality: N/A;  . Peripheral vascular catheterization N/A 02/10/2015    Procedure: Picc Line Insertion;  Surgeon: Katha Cabal, MD;  Location: [place] CV LAB;  Service: Cardiovascular;  Laterality: N/A;    Family History  Problem Relation Age of Onset  . Diabetes Mother   . Hypertension Mother   . Arthritis Mother     knees,  lumbar DDD, cervical DDD  . Cancer Father     prostate,skin,lymphoma.  . Obesity Brother   . Diabetes Maternal Grandmother   . Diabetes Paternal Grandmother    Social History:  reports that she has never smoked. She has never used smokeless tobacco. She reports that she does not drink alcohol or use illicit drugs.  Allergies: No Known Allergies  Medications Prior to Admission  Medication Sig Dispense Refill  . aspirin EC 81 MG tablet Take 1 tablet (81 mg total) by mouth daily. 90 tablet 3  . doxycycline (DORYX) 100 MG EC tablet Take 100 mg by mouth 2 (two) times daily.    Marland Kitchen gabapentin (NEURONTIN) 600 MG tablet Take 1 tablet (600 mg total) by mouth 3 (three) times daily. 270 tablet 3  . Insulin Glargine (LANTUS) 100 UNIT/ML Solostar Pen Inject 10 Units into the skin daily at 10 pm. 15 mL 11  . isosorbide mononitrate (IMDUR) 60 MG 24 hr tablet Take 1 tablet (60 mg total) by mouth daily. 90 tablet 3  . nortriptyline (PAMELOR) 50 MG capsule TAKE 1 CAPSULE BY MOUTH EVERY NIGHT AT BEDTIME 30 capsule 2  . Omega-3 Fatty Acids (FISH OIL) 1000 MG CAPS Take 100 mg by mouth 2 (two) times daily.    . pravastatin (PRAVACHOL) 40 MG tablet Take 1 tablet (40 mg total) by mouth at bedtime. 90 tablet 3  . sitaGLIPtin (JANUVIA) 100 MG tablet Take 1 tablet (100 mg total) by mouth daily. (Patient taking differently: Take 50 mg by mouth daily. ) 90 tablet 0  . carvedilol (COREG) 6.25 MG tablet Take 4 tablets (25 mg total) by mouth 2 (two) times daily with a meal. 60 tablet 0  . nitroGLYCERIN (NITROSTAT) 0.4 MG SL tablet Place 1 tablet (0.4 mg total) under the tongue every 5 (five) minutes as needed for chest pain. 30 tablet 0  . nystatin (MYCOSTATIN) 100000 UNIT/ML suspension Take 5 mLs (500,000 Units total) by mouth 4 (four) times daily. 200 mL 0  . torsemide (DEMADEX) 20 MG tablet Take 2 tablets (40 mg total) by mouth daily. 60 tablet 3    Results for orders placed or performed during the hospital encounter  of 06/16/15 (from the past 48 hour(s))  Glucose, capillary     Status: Abnormal   Collection Time: 06/16/15  7:35 PM  Result Value Ref Range   Glucose-Capillary 152 (H) 65 - 99 mg/dL  CBC     Status: Abnormal   Collection Time: 06/16/15  7:37 PM  Result Value Ref Range   WBC 5.6 3.6 - 11.0 K/uL   RBC 4.05 3.80 - 5.20 MIL/uL   Hemoglobin 10.6 (L) 12.0 - 16.0 g/dL   HCT 32.2 (L) 35.0 - 47.0 %   MCV 79.6 (L) 80.0 - 100.0 fL   MCH 26.1 26.0 - 34.0 pg   MCHC 32.8 32.0 - 36.0 g/dL   RDW 17.6 (H) 11.5 - 14.5 %   Platelets 152 150 -  440 K/uL  Basic metabolic panel     Status: Abnormal   Collection Time: 06/16/15  7:37 PM  Result Value Ref Range   Sodium 136 135 - 145 mmol/L   Potassium 4.9 3.5 - 5.1 mmol/L   Chloride 100 (L) 101 - 111 mmol/L   CO2 29 22 - 32 mmol/L   Glucose, Bld 164 (H) 65 - 99 mg/dL   BUN 30 (H) 6 - 20 mg/dL   Creatinine, Ser 1.51 (H) 0.44 - 1.00 mg/dL   Calcium 9.1 8.9 - 10.3 mg/dL   GFR calc non Af Amer 36 (L) >60 mL/min   GFR calc Af Amer 42 (L) >60 mL/min    Comment: (NOTE) The eGFR has been calculated using the CKD EPI equation. This calculation has not been validated in all clinical situations. eGFR's persistently <60 mL/min signify possible Chronic Kidney Disease.    Anion gap 7 5 - 15  TSH     Status: None   Collection Time: 06/16/15  7:37 PM  Result Value Ref Range   TSH 3.475 0.350 - 4.500 uIU/mL  Glucose, capillary     Status: Abnormal   Collection Time: 06/17/15  2:25 AM  Result Value Ref Range   Glucose-Capillary 122 (H) 65 - 99 mg/dL   Comment 1 Notify RN    Dg Foot Complete Right  06/15/2015   CLINICAL DATA:  Two months of nonhealing wound of the right second toe, history of diabetes, recently finished hyperbaric treatment for left foot wounds.  EXAM: RIGHT FOOT COMPLETE - 3+ VIEW  COMPARISON:  None in PACs  FINDINGS: There is ostial ice this of the tuft of the distal phalanx of the second toe. There is surrounding soft tissue swelling. As best  as can be determined the joint space is preserved. The proximal and middle phalanges are intact. The other toes exhibit no ostial ice is. There are mild degenerative changes of the first MTP joint. There is a large plantar calcaneal spur.  IMPRESSION: Findings compatible with osteomyelitis of the tuft of the distal phalanx of the second toe. There is overlying soft tissue swelling.   Electronically Signed   By: David  Martinique M.D.   On: 06/15/2015 10:44    Review of Systems  Constitutional: Negative for fever and chills.  HENT: Negative for sore throat and tinnitus.   Eyes: Negative for blurred vision and redness.  Respiratory: Negative for cough and shortness of breath.   Cardiovascular: Negative for chest pain, palpitations, orthopnea and PND.  Gastrointestinal: Negative for nausea, vomiting, abdominal pain and diarrhea.  Genitourinary: Negative for dysuria, urgency and frequency.  Musculoskeletal: Negative for myalgias and joint pain.       Pain in right second toe  Skin: Negative for rash.       No lesions  Neurological: Negative for speech change, focal weakness and weakness.  Endo/Heme/Allergies: Does not bruise/bleed easily.       No temperature intolerance  Psychiatric/Behavioral: Negative for depression and suicidal ideas.    Blood pressure 164/76, pulse 85, temperature 97.9 F (36.6 C), temperature source Oral, resp. rate 19, height _0  (1.727 m), weight 108.138 kg (238 lb 6.4 oz), SpO2 100 %. Physical Exam  Vitals reviewed. Constitutional: She is oriented to person, place, and time. She appears well-developed and well-nourished.  HENT:  Head: Normocephalic and atraumatic.  Eyes: EOM are normal. Pupils are equal, round, and reactive to light.  Neck: Normal range of motion. No tracheal deviation present. No thyromegaly present.  Cardiovascular: Normal rate, regular rhythm and normal heart sounds.  Exam reveals no gallop and no friction rub.   No murmur heard. Respiratory:  Effort normal and breath sounds normal.  GI: Soft. Bowel sounds are normal. She exhibits no distension. There is no tenderness.  Musculoskeletal: Normal range of motion. She exhibits no edema.  Lymphadenopathy:    She has no cervical adenopathy.  Neurological: She is alert and oriented to person, place, and time. No cranial nerve deficit. She exhibits normal muscle tone.  Skin: Skin is warm and dry.  Psychiatric: She has a normal mood and affect. Judgment and thought content normal.     Assessment/Plan This is a 59 year old Caucasian female admitted for osteomyelitis and surrounding cellulitis of her second right toe. 1. Cellulitis: The right second toe is swollen and erythematous and macerated with surrounding warmth up to the calf and redness to the base of the digit with developing erythema of the distal foot. The patient has no signs or symptoms of sepsis at this time however she will likely need IV antibiotics. Cipro and vancomycin given in the emergency department. We will continue the latter. Infectious disease consult ordered per patient's request. 2. Osteomyelitis: Seen at the distal portion of the digit on x-ray. Podiatry consult ordered. 3. Diabetes mellitus type 2: Sliding scale insulin while hospitalized. I have adjusted the patient's basal insulin dose for hospital back. 4. Congestive heart failure: Systolic; stable. Continue torsemide 5. Coronary artery disease: Stable. Continue aspirin and Imdur 6. Hyperlipidemia: Continue pravastatin 7. DVT prophylaxis: Heparin 8. GI prophylaxis: None The patient is a full code. Time spent on admission orders and patient care approximately 35 minutes  Harrie Foreman 06/17/2015, 5:15 AM

## 2015-06-18 ENCOUNTER — Observation Stay: Payer: PPO | Admitting: Anesthesiology

## 2015-06-18 ENCOUNTER — Encounter
Admission: EM | Disposition: A | Discharge status: Home / Self Care | Payer: Self-pay | Source: Home / Self Care | Attending: Internal Medicine

## 2015-06-18 DIAGNOSIS — Z9049 Acquired absence of other specified parts of digestive tract: Secondary | ICD-10-CM | POA: Diagnosis present

## 2015-06-18 DIAGNOSIS — E559 Vitamin D deficiency, unspecified: Secondary | ICD-10-CM | POA: Diagnosis present

## 2015-06-18 DIAGNOSIS — E119 Type 2 diabetes mellitus without complications: Secondary | ICD-10-CM | POA: Diagnosis present

## 2015-06-18 DIAGNOSIS — M869 Osteomyelitis, unspecified: Secondary | ICD-10-CM | POA: Diagnosis present

## 2015-06-18 DIAGNOSIS — Z95810 Presence of automatic (implantable) cardiac defibrillator: Secondary | ICD-10-CM | POA: Diagnosis not present

## 2015-06-18 DIAGNOSIS — I5022 Chronic systolic (congestive) heart failure: Secondary | ICD-10-CM | POA: Diagnosis present

## 2015-06-18 DIAGNOSIS — L03115 Cellulitis of right lower limb: Secondary | ICD-10-CM | POA: Diagnosis present

## 2015-06-18 DIAGNOSIS — Z9851 Tubal ligation status: Secondary | ICD-10-CM | POA: Diagnosis not present

## 2015-06-18 DIAGNOSIS — Z833 Family history of diabetes mellitus: Secondary | ICD-10-CM | POA: Diagnosis not present

## 2015-06-18 DIAGNOSIS — E669 Obesity, unspecified: Secondary | ICD-10-CM | POA: Diagnosis present

## 2015-06-18 DIAGNOSIS — G4733 Obstructive sleep apnea (adult) (pediatric): Secondary | ICD-10-CM | POA: Diagnosis present

## 2015-06-18 DIAGNOSIS — E1140 Type 2 diabetes mellitus with diabetic neuropathy, unspecified: Secondary | ICD-10-CM | POA: Diagnosis present

## 2015-06-18 DIAGNOSIS — E780 Pure hypercholesterolemia: Secondary | ICD-10-CM | POA: Diagnosis present

## 2015-06-18 DIAGNOSIS — Z8261 Family history of arthritis: Secondary | ICD-10-CM | POA: Diagnosis not present

## 2015-06-18 DIAGNOSIS — Z8042 Family history of malignant neoplasm of prostate: Secondary | ICD-10-CM | POA: Diagnosis not present

## 2015-06-18 DIAGNOSIS — E785 Hyperlipidemia, unspecified: Secondary | ICD-10-CM | POA: Diagnosis present

## 2015-06-18 DIAGNOSIS — Z79899 Other long term (current) drug therapy: Secondary | ICD-10-CM | POA: Diagnosis not present

## 2015-06-18 DIAGNOSIS — L97429 Non-pressure chronic ulcer of left heel and midfoot with unspecified severity: Secondary | ICD-10-CM | POA: Diagnosis present

## 2015-06-18 DIAGNOSIS — I2510 Atherosclerotic heart disease of native coronary artery without angina pectoris: Secondary | ICD-10-CM | POA: Diagnosis present

## 2015-06-18 DIAGNOSIS — Z794 Long term (current) use of insulin: Secondary | ICD-10-CM | POA: Diagnosis not present

## 2015-06-18 DIAGNOSIS — Z808 Family history of malignant neoplasm of other organs or systems: Secondary | ICD-10-CM | POA: Diagnosis not present

## 2015-06-18 DIAGNOSIS — Z8249 Family history of ischemic heart disease and other diseases of the circulatory system: Secondary | ICD-10-CM | POA: Diagnosis not present

## 2015-06-18 DIAGNOSIS — I10 Essential (primary) hypertension: Secondary | ICD-10-CM | POA: Diagnosis present

## 2015-06-18 DIAGNOSIS — Z9889 Other specified postprocedural states: Secondary | ICD-10-CM | POA: Diagnosis not present

## 2015-06-18 DIAGNOSIS — Z7982 Long term (current) use of aspirin: Secondary | ICD-10-CM | POA: Diagnosis not present

## 2015-06-18 HISTORY — PX: AMPUTATION TOE: SHX6595

## 2015-06-18 LAB — GLUCOSE, CAPILLARY
Glucose-Capillary: 179 mg/dL — ABNORMAL HIGH (ref 65–99)
Glucose-Capillary: 156 mg/dL — ABNORMAL HIGH (ref 65–99)
Glucose-Capillary: 170 mg/dL — ABNORMAL HIGH (ref 65–99)
Glucose-Capillary: 148 mg/dL — ABNORMAL HIGH (ref 65–99)
Glucose-Capillary: 132 mg/dL — ABNORMAL HIGH (ref 65–99)

## 2015-06-18 SURGERY — AMPUTATION, TOE
Anesthesia: Monitor Anesthesia Care | Laterality: Right

## 2015-06-18 MED ORDER — FENTANYL CITRATE (PF) 100 MCG/2ML IJ SOLN
25.0000 ug | INTRAMUSCULAR | Status: DC | PRN
Start: 2015-06-18 — End: 2015-06-19

## 2015-06-18 MED ORDER — OXYCODONE-ACETAMINOPHEN 5-325 MG PO TABS: 1.0000 | ORAL_TABLET | ORAL | Status: DC | PRN

## 2015-06-18 MED ORDER — SODIUM CHLORIDE 0.9 % IV SOLN
INTRAVENOUS | Status: DC | PRN
  Administered 2015-06-18: 09:00:00 via INTRAVENOUS

## 2015-06-18 MED ORDER — PROPOFOL INFUSION 10 MG/ML OPTIME
INTRAVENOUS | Status: DC | PRN
  Administered 2015-06-18: 100 ug/kg/min via INTRAVENOUS

## 2015-06-18 MED ORDER — PROPOFOL 10 MG/ML IV BOLUS
INTRAVENOUS | Status: DC | PRN
Start: 2015-06-18 — End: 2015-06-18
  Administered 2015-06-18: 50 mg via INTRAVENOUS

## 2015-06-18 MED ORDER — LIDOCAINE HCL (PF) 1 % IJ SOLN
INTRAMUSCULAR | Status: AC
  Filled 2015-06-18: qty 30

## 2015-06-18 MED ORDER — BUPIVACAINE HCL (PF) 0.5 % IJ SOLN
INTRAMUSCULAR | Status: AC
  Filled 2015-06-18: qty 30

## 2015-06-18 MED ORDER — BUPIVACAINE HCL 0.5 % IJ SOLN
INTRAMUSCULAR | Status: DC | PRN
  Administered 2015-06-18: 4 mL

## 2015-06-18 MED ORDER — ONDANSETRON HCL 4 MG/2ML IJ SOLN: 4.0000 mg | Freq: Once | INTRAMUSCULAR | Status: DC | PRN

## 2015-06-18 MED ORDER — LIDOCAINE HCL (PF) 1 % IJ SOLN
INTRAMUSCULAR | Status: DC | PRN
  Administered 2015-06-18: 4 mL

## 2015-06-18 MED ORDER — MAGNESIUM HYDROXIDE 400 MG/5ML PO SUSP
30.0000 mL | Freq: Two times a day (BID) | ORAL | Status: DC | PRN
  Administered 2015-06-18: 30 mL via ORAL
  Filled 2015-06-18: qty 30

## 2015-06-18 SURGICAL SUPPLY — 44 items
BANDAGE CONFORM 2X5YD N/S (GAUZE/BANDAGES/DRESSINGS) ×1 IMPLANT
BANDAGE ELASTIC 4 CLIP NS LF (GAUZE/BANDAGES/DRESSINGS) ×2 IMPLANT
BANDAGE STRETCH 3X4.1 STRL (GAUZE/BANDAGES/DRESSINGS) ×4 IMPLANT
BLADE OSC/SAGITTAL MD 5.5X18 (BLADE) ×2 IMPLANT
BLADE SURG MINI STRL (BLADE) ×2 IMPLANT
BNDG CMPR 75X21 PLY HI ABS (MISCELLANEOUS) ×1
BNDG ESMARK 4X12 TAN STRL LF (GAUZE/BANDAGES/DRESSINGS) ×2 IMPLANT
BNDG GAUZE 4.5X4.1 6PLY STRL (MISCELLANEOUS) ×2 IMPLANT
CANISTER SUCT 1200ML W/VALVE (MISCELLANEOUS) ×2 IMPLANT
DRAPE FLUOR MINI C-ARM 54X84 (DRAPES) ×2 IMPLANT
DRAPE XRAY CASSETTE 23X24 (DRAPES) ×2 IMPLANT
DURAPREP 26ML APPLICATOR (WOUND CARE) ×2 IMPLANT
GAUZE IODOFORM PACK 1/2 7832 (GAUZE/BANDAGES/DRESSINGS) ×2 IMPLANT
GAUZE PETRO XEROFOAM 1X8 (MISCELLANEOUS) ×2 IMPLANT
GAUZE SPONGE 4X4 12PLY STRL (GAUZE/BANDAGES/DRESSINGS) ×2 IMPLANT
GAUZE STRETCH 2X75IN STRL (MISCELLANEOUS) ×2 IMPLANT
GAUZE XEROFORM 4X4 STRL (GAUZE/BANDAGES/DRESSINGS) ×1 IMPLANT
GLOVE BIO SURGEON STRL SZ7.5 (GLOVE) ×2 IMPLANT
GLOVE INDICATOR 8.0 STRL GRN (GLOVE) ×2 IMPLANT
GOWN STRL REUS W/ TWL LRG LVL3 (GOWN DISPOSABLE) ×2 IMPLANT
GOWN STRL REUS W/TWL LRG LVL3 (GOWN DISPOSABLE) ×4
HANDPIECE SUCTION TUBG SURGILV (MISCELLANEOUS) ×2 IMPLANT
KIT RM TURNOVER STRD PROC AR (KITS) ×2 IMPLANT
LABEL OR SOLS (LABEL) ×2 IMPLANT
NDL FILTER BLUNT 18X1 1/2 (NEEDLE) ×1 IMPLANT
NDL HYPO 25X1 1.5 SAFETY (NEEDLE) ×1 IMPLANT
NEEDLE FILTER BLUNT 18X 1/2SAF (NEEDLE) ×1
NEEDLE FILTER BLUNT 18X1 1/2 (NEEDLE) ×1 IMPLANT
NEEDLE HYPO 25X1 1.5 SAFETY (NEEDLE) ×2 IMPLANT
NS IRRIG 500ML POUR BTL (IV SOLUTION) ×2 IMPLANT
PACK EXTREMITY ARMC (MISCELLANEOUS) ×2 IMPLANT
PAD ABD DERMACEA PRESS 5X9 (GAUZE/BANDAGES/DRESSINGS) ×4 IMPLANT
PAD GROUND ADULT SPLIT (MISCELLANEOUS) ×2 IMPLANT
SOL .9 NS 3000ML IRR  AL (IV SOLUTION) ×1
SOL .9 NS 3000ML IRR AL (IV SOLUTION) ×1
SOL .9 NS 3000ML IRR UROMATIC (IV SOLUTION) ×1 IMPLANT
STOCKINETTE M/LG 89821 (MISCELLANEOUS) ×2 IMPLANT
STRAP SAFETY BODY (MISCELLANEOUS) ×2 IMPLANT
STRIP CLOSURE SKIN 1/2X4 (GAUZE/BANDAGES/DRESSINGS) ×1 IMPLANT
SUT ETHILON 3-0 FS-10 30 BLK (SUTURE) ×2
SUT ETHILON 5-0 FS-2 18 BLK (SUTURE) ×2 IMPLANT
SUT VIC AB 4-0 FS2 27 (SUTURE) ×2 IMPLANT
SUTURE EHLN 3-0 FS-10 30 BLK (SUTURE) ×1 IMPLANT
SYRINGE 10CC LL (SYRINGE) ×6 IMPLANT

## 2015-06-18 NOTE — OR Nursing (Signed)
Patient given a surgical shoe per Dr. Vickki Muff.

## 2015-06-18 NOTE — Progress Notes (Signed)
Patient underwent a partial amputation today. This was taken proximal to the proximal interphalangeal joint. Suspect she can be discharged tomorrow on by mouth antibiotics if medically appropriate. Will follow culture in the outpatient clinic and change as needed. Suggest doxycycline and Cipro for outpatient antibiotics. No dressing changes are needed. Dressing will be kept clean dry and intact. Plan to see patient this week.

## 2015-06-18 NOTE — Op Note (Signed)
Operative note   Surgeon:Dany Walther Lawyer: None    Preop diagnosis: Osteomyelitis distal right second toe    Postop diagnosis: Same    Procedure: Amputation right second toe at PIPJ    EBL: Minimal    Anesthesia:local and IV sedation    Hemostasis: None    Specimen: Osteomyelitis right second toe    Complications: None    Operative indications: 59 year old female with an ulcer to her right second toe with infection to bone with signs of osteomyelitis on x-ray. We discussed conservative and surgical treatment and she presents today for surgery. The risks benefits alternatives and competitions associated were discussed with the patient for an consent has been given    Procedure:  Patient was brought into the OR and placed on the operating table in thesupine position. After anesthesia was obtained theright lower extremity was prepped and draped in usual sterile fashion.  Attention was directed to the second toe at this proximal to the proximal interphalangeal joint where mouth incision was made dorsal and plantar. Full-thickness incision was then made. This was taken down to the the proximal phalanx of the second toe on the right foot. This time with a power saw was able to transect the head of the proximal phalanx and the toe was removed from the surgical field in toto. All bleeding was Bovie cauterized as needed. The wound was flushed with copious amounts or irrigation. Closure was performed with a 4-0 nylon for skin. A well compressive sterile dressing was placed.    Patient tolerated the procedure and anesthesia well.  Was transported from the OR to the PACU with all vital signs stable and vascular status intact. To be discharged per routine protocol and transferred back to the floor today.  Will follow up in approximately 1 week in the outpatient clinic.

## 2015-06-18 NOTE — Progress Notes (Signed)
[place] [facility] [place] NAME: [name]    MR#:  [id number]  DATE OF BIRTH:  1955/10/21  SUBJECTIVE: 59 year old female patient with history of type 2 diabetes mellitus, history of osteomyelitis, obesity, chronic systolic heart failure, hypertension, diabetes type 2, comes in secondary to right second toe  swelling, redness, possible osteo myelitis. Pt   have right the second toe  Amputation.  CHIEF COMPLAINT:   Chief Complaint  Patient presents with  . Toe Pain    REVIEW OF SYSTEMS:   ROS CONSTITUTIONAL: No fever, fatigue or weakness.  EYES: No blurred or double vision.  EARS, NOSE, AND THROAT: No tinnitus or ear pain.  RESPIRATORY: No cough, shortness of breath, wheezing or hemoptysis.  CARDIOVASCULAR: No chest pain, orthopnea, edema.  GASTROINTESTINAL: No nausea, vomiting, diarrhea or abdominal pain.  GENITOURINARY: No dysuria, hematuria.  ENDOCRINE: No polyuria, nocturia,  HEMATOLOGY: No anemia, easy bruising or bleeding SKIN: No rash or lesion. MUSCULOSKELETAL: Swelling and redness of the right second toe. And extending into the dorsum of the right foot.  NEUROLOGIC: No tingling, numbness, weakness.  PSYCHIATRY: No anxiety or depression.   DRUG ALLERGIES:  No Known Allergies  VITALS:  Blood pressure 114/65, pulse 85, temperature 97.8 F (36.6 C), temperature source Oral, resp. rate 16, height 5\' 8"  (1.727 m), weight 108.138 kg (238 lb 6.4 oz), SpO2 100 %.  PHYSICAL EXAMINATION:  GENERAL:  59 y.o.-year-old patient lying in the bed with no acute distress.  EYES: Pupils equal, round, reactive to light and accommodation. No scleral icterus. Extraocular muscles intact.  HEENT: Head atraumatic, normocephalic. Oropharynx and nasopharynx clear.  NECK:  Supple, no jugular venous distention. No thyroid enlargement, no tenderness.  LUNGS: Normal breath sounds bilaterally, no wheezing, rales,rhonchi or crepitation. No use of  accessory muscles of respiration.  CARDIOVASCULAR: S1, S2 normal. No murmurs, rubs, or gallops.  ABDOMEN: Soft, nontender, nondistended. Bowel sounds present. No organomegaly or mass.  EXTREMITIES: Swelling of the right second toe going into the dorsum of the right foot. No tenderness.  dorsalis pedis pulse.palpable. NEUROLOGIC: Cranial nerves II through XII are intact. Muscle strength 5/5 in all extremities. Sensation intact. Gait not checked.  PSYCHIATRIC: The patient is alert and oriented x 3.  SKIN: No obvious rash, lesion, or ulcer.    LABORATORY PANEL:   CBC  Recent Labs Lab 06/16/15 1937  WBC 5.6  HGB 10.6*  HCT 32.2*  PLT 152   ------------------------------------------------------------------------------------------------------------------  Chemistries   Recent Labs Lab 06/16/15 1937  NA 136  K 4.9  CL 100*  CO2 29  GLUCOSE 164*  BUN 30*  CREATININE 1.51*  CALCIUM 9.1   ------------------------------------------------------------------------------------------------------------------  Cardiac Enzymes No results for input(s): TROPONINI in the last 168 hours. ------------------------------------------------------------------------------------------------------------------  RADIOLOGY:  No results found.  EKG:   Orders placed or performed in visit on 03/17/15  . EKG 12-Lead    ASSESSMENT AND PLAN:    1.Right second toe osteomyelitis: Continue vancomycin, Cipro.. Patient went for the amputation of the right secondary toe by podiatry..  . #2 diabetes mellitus type 2: Continue strict glycemic control., Continue Lantus, mealtime insulin. And adjust #3 chronic systolic heart failure stable at this time continue, continue Coreg,.torsemide. Patient is status post ICD.   #4 Hyperlipidemia: Continue statins. Number for diabetic neuropathy ;continue Neurontin.   All the records are reviewed and case discussed with Care Management/Social Workerr. Management  plans discussed with the patient, family and they are in agreement.  CODE STATUS: Full  TOTAL TIME TAKING CARE OF THIS PATIENT: 32minutes.   POSSIBLE D/C IN 1-3 DAYS, DEPENDING ON CLINICAL CONDITION.   Epifanio Lesches M.D on 06/18/2015 at 10:04 AM  Between 7am to 6pm - Pager - [phone]  After 6pm go to [email or web address] - password [email or web address]  [facility]  Office  [phone]  CC: Primary care physician; [name], MD

## 2015-06-18 NOTE — Transfer of Care (Signed)
Immediate Anesthesia Transfer of Care Note  Patient: [name]  Procedure(s) Performed: Procedure(s): AMPUTATION TOE (Right)  Patient Location: PACU  Anesthesia Type:MAC  Level of Consciousness: awake  Airway & Oxygen Therapy: Patient Spontanous Breathing  Post-op Assessment: Report given to RN  Post vital signs: stable  Last Vitals:  Filed Vitals:   06/18/15 0733  BP: 114/65  Pulse: 85  Temp: 36.6 C  Resp: 16    Complications: No apparent anesthesia complications

## 2015-06-18 NOTE — Anesthesia Preprocedure Evaluation (Signed)
Anesthesia Evaluation  Patient identified by MRN, date of birth, ID band Patient awake    Reviewed: Allergy & Precautions, H&P , NPO status , Patient's Chart, lab work & pertinent test results, reviewed documented beta blocker date and time   Airway Mallampati: II  TM Distance: >3 FB Neck ROM: full    Dental no notable dental hx. (+) Teeth Intact   Pulmonary neg pulmonary ROS, sleep apnea ,    breath sounds clear to auscultation       Cardiovascular Exercise Tolerance: Poor hypertension, + Past MI and +CHF  negative cardio ROS Normal cardiovascular exam+ dysrhythmias + Cardiac Defibrillator  Rhythm:regular Rate:Normal     Neuro/Psych  Neuromuscular disease negative neurological ROS  negative psych ROS   GI/Hepatic negative GI ROS, Neg liver ROS,   Endo/Other  negative endocrine ROSdiabetesMorbid obesity  Renal/GU Renal disease     Musculoskeletal   Abdominal   Peds  Hematology negative hematology ROS (+) anemia ,   Anesthesia Other Findings   Reproductive/Obstetrics negative OB ROS                             Anesthesia Physical Anesthesia Plan  ASA: IV and emergent  Anesthesia Plan: MAC   Post-op Pain Management:    Induction:   Airway Management Planned:   Additional Equipment:   Intra-op Plan:   Post-operative Plan:   Informed Consent: I have reviewed the patients History and Physical, chart, labs and discussed the procedure including the risks, benefits and alternatives for the proposed anesthesia with the patient or authorized representative who has indicated his/her understanding and acceptance.     Plan Discussed with: CRNA  Anesthesia Plan Comments:         Anesthesia Quick Evaluation

## 2015-06-18 NOTE — Progress Notes (Signed)
MD paged for patient request for med to have bowel movement, no BM since Thursday. Orders received MOM 29ml po bid prn constipation.

## 2015-06-19 ENCOUNTER — Encounter: Payer: Self-pay | Admitting: Podiatry

## 2015-06-19 LAB — GLUCOSE, CAPILLARY
Glucose-Capillary: 161 mg/dL — ABNORMAL HIGH (ref 65–99)
Glucose-Capillary: 168 mg/dL — ABNORMAL HIGH (ref 65–99)

## 2015-06-19 LAB — CBC
HCT: 30.8 % — ABNORMAL LOW (ref 35.0–47.0)
Hemoglobin: 10.2 g/dL — ABNORMAL LOW (ref 12.0–16.0)
MCH: 26.4 pg (ref 26.0–34.0)
MCHC: 33.1 g/dL (ref 32.0–36.0)
MCV: 79.8 fL — ABNORMAL LOW (ref 80.0–100.0)
Platelets: 150 10*3/uL (ref 150–440)
RBC: 3.87 MIL/uL (ref 3.80–5.20)
RDW: 17.4 % — ABNORMAL HIGH (ref 11.5–14.5)
WBC: 4.5 10*3/uL (ref 3.6–11.0)

## 2015-06-19 LAB — BASIC METABOLIC PANEL
Anion gap: 7 (ref 5–15)
BUN: 26 mg/dL — ABNORMAL HIGH (ref 6–20)
CO2: 29 mmol/L (ref 22–32)
Calcium: 8.5 mg/dL — ABNORMAL LOW (ref 8.9–10.3)
Chloride: 103 mmol/L (ref 101–111)
Creatinine, Ser: 1.06 mg/dL — ABNORMAL HIGH (ref 0.44–1.00)
GFR calc Af Amer: >60 mL/min (ref >60)
GFR calc non Af Amer: 55 mL/min — ABNORMAL LOW (ref >60)
Glucose, Bld: 187 mg/dL — ABNORMAL HIGH (ref 65–99)
Potassium: 4.1 mmol/L (ref 3.5–5.1)
Sodium: 139 mmol/L (ref 135–145)

## 2015-06-19 MED ORDER — CIPROFLOXACIN HCL 500 MG PO TABS: 500.0000 mg | ORAL_TABLET | Freq: Two times a day (BID) | ORAL | Status: DC

## 2015-06-19 MED ORDER — VANCOMYCIN HCL 10 G IV SOLR
1250.0000 mg | Freq: Two times a day (BID) | INTRAVENOUS | Status: DC
  Filled 2015-06-19 (×3): qty 1250

## 2015-06-19 MED ORDER — DOXYCYCLINE HYCLATE 100 MG PO TBEC: 100.0000 mg | DELAYED_RELEASE_TABLET | Freq: Two times a day (BID) | ORAL | Status: DC

## 2015-06-19 NOTE — Progress Notes (Signed)
Paged Dr. Manuella Ghazi re: d/c. Need order rec completed.

## 2015-06-19 NOTE — Evaluation (Signed)
Physical Therapy Evaluation Patient Details Name: [name] MRN: [id number] DOB: August 26, 1956 Today's Date: 06/19/2015   History of Present Illness  Pt admitted for osteomyelitis on R foot. Pt is now s/p R toe amputation on 06/18/15. Pt with history of DM, osteo on L foot, CAD, obesity, and HTN.   Clinical Impression  Pt is a pleasant 59 year old female who was admitted for R toe amputation on 06/18/15. Pt performs bed mobility, transfers independently, and ambulation with rw and supervision.  Pt does not require any further PT needs at this time. Pt will be dc in house and does not require follow up. RN aware. Will dc current orders.        Follow Up Recommendations No PT follow up    Equipment Recommendations   (has all equipment at home)    Recommendations for Other Services       Precautions / Restrictions Precautions Precautions:  (Pt has R post op shoe for offloading) Required Braces or Orthoses:  (post op shoe) Restrictions Weight Bearing Restrictions: No Other Position/Activity Restrictions: wear post op shoe for mobility      Mobility  Bed Mobility Overal bed mobility: Independent             General bed mobility comments: safe technique performed  Transfers Overall transfer level: Modified independent Equipment used: Rolling walker (2 wheeled)             General transfer comment: safe technique with correct hand placement. No LOB noted  Ambulation/Gait Ambulation/Gait assistance: Supervision Ambulation Distance (Feet): 220 Feet Assistive device: Rolling walker (2 wheeled) Gait Pattern/deviations: Step-through pattern     General Gait Details: ambulated using rw and reciprocal gait pattern. Safe technique performed with no SOB symptoms noted. Post op shoe donned.  Stairs            Wheelchair Mobility    Modified Rankin (Stroke Patients Only)       Balance Overall balance assessment: Independent                                            Pertinent Vitals/Pain Pain Assessment: No/denies pain    Home Living Family/patient expects to be discharged to:: Private residence Living Arrangements: Spouse/significant other Available Help at Discharge: Family;Available 24 hours/day Type of Home: House Home Access: Level entry     Home Layout: Able to live on main level with bedroom/bathroom;Two level Home Equipment: Walker - 2 wheels;Bedside commode;Shower seat;Wheelchair - manual      Prior Function Level of Independence: Independent with assistive device(s)         Comments: Pt's husband present 24/7 and able to physically assist pt with all transfers; pt's grandson and daughter also able to physically assist.     Hand Dominance        Extremity/Trunk Assessment   Upper Extremity Assessment: Overall WFL for tasks assessed           Lower Extremity Assessment: Overall WFL for tasks assessed         Communication   Communication: No difficulties  Cognition Arousal/Alertness: Awake/alert Behavior During Therapy: WFL for tasks assessed/performed Overall Cognitive Status: Within Functional Limits for tasks assessed                      General Comments      Exercises  Assessment/Plan    PT Assessment Patent does not need any further PT services  PT Diagnosis     PT Problem List    PT Treatment Interventions     PT Goals (Current goals can be found in the Care Plan section) Acute Rehab PT Goals Patient Stated Goal: to go home PT Goal Formulation: With patient Time For Goal Achievement: 07/13/2015 Potential to Achieve Goals: Good    Frequency     Barriers to discharge        Co-evaluation               End of Session Equipment Utilized During Treatment: Gait belt Activity Tolerance: Patient tolerated treatment well Patient left: in chair;with chair alarm set Nurse Communication: Mobility status         Time: 1610-9604 PT Time  Calculation (min) (ACUTE ONLY): 19 min   Charges:   PT Evaluation $Initial PT Evaluation Tier I: 1 Procedure     PT G Codes:        Ashraf Mesta 07/13/15, 11:20 AM Elizabeth Palau, PT, DPT [phone]

## 2015-06-19 NOTE — Discharge Summary (Signed)
[facility] - [place] at [facility]   PATIENT NAME: [name]    MR#:  [id number]  DATE OF BIRTH:  November 10, 1955  DATE OF ADMISSION:  06/16/2015 ADMITTING PHYSICIAN: Arnaldo Natal, MD  DATE OF DISCHARGE: 06/19/2015  1:58 PM  PRIMARY CARE PHYSICIAN: SMITH,KRISTI, MD    ADMISSION DIAGNOSIS:  Osteomyelitis [M86.9] Cellulitis of right lower extremity [L03.115] DISCHARGE DIAGNOSIS:  Active Problems:   Osteomyelitis  SECONDARY DIAGNOSIS:   Past Medical History  Diagnosis Date  . Obesity, unspecified   . Unspecified vitamin D deficiency   . Pure hypercholesterolemia   . Neuropathy 2011  . Chicken pox   . Chronic systolic CHF (congestive heart failure)     a. mixed ICM & NICM; b. EF 20-25% by echo 07/2012, mid-dist 2/3 of LV sev HK/AK, mild MR. echo 10/2012: EF 30-35%, sev HK ant-septal & inf walls, GR1DD, mild MR, PASP 33. c. echo 02/2013: EF 30%, GR1DD, mild MR. echo 04/2014: EF 30%, Septal-lat dyssynchrony, global HK, inf AK, GR1DD, mild MR. d. echo 10/2014: EF50-55%, WM nl, GR1DD, septal mild paradox. e. echo 02/2015: EF 50-55%, wm   . Hypertension   . Anemia   . Automatic implantable cardioverter-defibrillator in situ     a. MDT CRT-D 06/2014, SN: [id number]    . Type II diabetes mellitus   . CAD (coronary artery disease)     a. cardiac cath 101/04/2012: PCI/DES to chronically occluded mLAD, consideration PCI to diag branch in 4 weeks.   Marland Kitchen History of blood transfusion ~ 2011    "plasma; had neuropathy; couldn't walk"  . OSA on CPAP     Moderate with AHI 23/hr and now on CPAP at 16cm H2O.  Her DME is [facility]  . LBBB (left bundle branch block)    HOSPITAL COURSE:  59 year old Caucasian female admitted for osteomyelitis and surrounding cellulitis of her second right toe.  Please see Dr. Elias Else dictated history and physical for further details.  She was started on broad-spectrum IV antibiotics.  Podiatry consultation was obtained with Dr. Ether Griffins who  recommended amputation of right second toe which was performed on September 11 as proximal to the proximal interphalangeal joint.   She was doing better and was discharged on oral doxycycline and Cipro for outpatient antibiotics. No dressing changes are needed. Dressing will be kept clean dry and intact. Plan to have her follow-up with Dr. Ether Griffins this week.  She is agreeable with the discharge plan and is discharged home in stable condition. DISCHARGE CONDITIONS:  stable CONSULTS OBTAINED:  Treatment Team:  Gwyneth Revels, DPM Clydie Braun, MD DRUG ALLERGIES:  No Known Allergies DISCHARGE MEDICATIONS:   Discharge Medication List as of 06/19/2015  1:44 PM    START taking these medications   Details  ciprofloxacin (CIPRO) 500 MG tablet Take 1 tablet (500 mg total) by mouth 2 (two) times daily., Starting 06/19/2015, Until Discontinued, Normal      CONTINUE these medications which have CHANGED   Details  doxycycline (DORYX) 100 MG EC tablet Take 1 tablet (100 mg total) by mouth 2 (two) times daily., Starting 06/19/2015, Until Discontinued, Normal      CONTINUE these medications which have NOT CHANGED   Details  aspirin EC 81 MG tablet Take 1 tablet (81 mg total) by mouth daily., Starting 03/31/2014, Until Discontinued, Normal    gabapentin (NEURONTIN) 600 MG tablet Take 1 tablet (600 mg total) by mouth 3 (three) times daily., Starting 03/31/2014, Until Discontinued, Normal    Insulin  Glargine (LANTUS) 100 UNIT/ML Solostar Pen Inject 10 Units into the skin daily at 10 pm., Starting 04/27/2015, Until Discontinued, Normal    isosorbide mononitrate (IMDUR) 60 MG 24 hr tablet Take 1 tablet (60 mg total) by mouth daily., Starting 12/12/2014, Until Discontinued, Normal    nortriptyline (PAMELOR) 50 MG capsule TAKE 1 CAPSULE BY MOUTH EVERY NIGHT AT BEDTIME, Normal    Omega-3 Fatty Acids (FISH OIL) 1000 MG CAPS Take 100 mg by mouth 2 (two) times daily., Until Discontinued, Historical Med     pravastatin (PRAVACHOL) 40 MG tablet Take 1 tablet (40 mg total) by mouth at bedtime., Starting 03/31/2014, Until Discontinued, Normal    sitaGLIPtin (JANUVIA) 100 MG tablet Take 1 tablet (100 mg total) by mouth daily., Starting 01/10/2015, Until Discontinued, Normal    carvedilol (COREG) 6.25 MG tablet Take 4 tablets (25 mg total) by mouth 2 (two) times daily with a meal., Starting 05/02/2015, Until Discontinued, Normal    nitroGLYCERIN (NITROSTAT) 0.4 MG SL tablet Place 1 tablet (0.4 mg total) under the tongue every 5 (five) minutes as needed for chest pain., Starting 07/26/2012, Until Discontinued, Normal    nystatin (MYCOSTATIN) 100000 UNIT/ML suspension Take 5 mLs (500,000 Units total) by mouth 4 (four) times daily., Starting 04/27/2015, Until Discontinued, Normal    torsemide (DEMADEX) 20 MG tablet Take 2 tablets (40 mg total) by mouth daily., Starting 12/19/2014, Until Discontinued, Normal       DISCHARGE INSTRUCTIONS:   DIET:  Cardiac diet DISCHARGE CONDITION:  Good ACTIVITY:  Activity as tolerated OXYGEN:  Home Oxygen: No.  Oxygen Delivery: room air DISCHARGE LOCATION:  home   If you experience worsening of your admission symptoms, develop shortness of breath, life threatening emergency, suicidal or homicidal thoughts you must seek medical attention immediately by calling 911 or calling your MD immediately  if symptoms less severe.  You Must read complete instructions/literature along with all the possible adverse reactions/side effects for all the Medicines you take and that have been prescribed to you. Take any new Medicines after you have completely understood and accpet all the possible adverse reactions/side effects.   Please note  You were cared for by a hospitalist during your hospital stay. If you have any questions about your discharge medications or the care you received while you were in the hospital after you are discharged, you can call the unit and asked to speak  with the hospitalist on call if the hospitalist that took care of you is not available. Once you are discharged, your primary care physician will handle any further medical issues. Please note that NO REFILLS for any discharge medications will be authorized once you are discharged, as it is imperative that you return to your primary care physician (or establish a relationship with a primary care physician if you do not have one) for your aftercare needs so that they can reassess your need for medications and monitor your lab values.    On the day of Discharge: VITAL SIGNS:  Blood pressure 130/61, pulse 82, temperature 97.8 F (36.6 C), temperature source Oral, resp. rate 18, height 5\' 8"  (1.727 m), weight 236 lb 3.2 oz (107.14 kg), SpO2 98 %. PHYSICAL EXAMINATION:  GENERAL:  59 y.o.-year-old patient lying in the bed with no acute distress.  EYES: Pupils equal, round, reactive to light and accommodation. No scleral icterus. Extraocular muscles intact.  HEENT: Head atraumatic, normocephalic. Oropharynx and nasopharynx clear.  NECK:  Supple, no jugular venous distention. No thyroid enlargement, no tenderness.  LUNGS: Normal breath sounds bilaterally, no wheezing, rales,rhonchi or crepitation. No use of accessory muscles of respiration.  CARDIOVASCULAR: S1, S2 normal. No murmurs, rubs, or gallops.  ABDOMEN: Soft, non-tender, non-distended. Bowel sounds present. No organomegaly or mass.  EXTREMITIES: No pedal edema, cyanosis, or clubbing.  NEUROLOGIC: Cranial nerves II through XII are intact. Muscle strength 5/5 in all extremities. Sensation intact. Gait not checked.  PSYCHIATRIC: The patient is alert and oriented x 3.  SKIN: No obvious rash, lesion, or ulcer.  DATA REVIEW:   CBC  Recent Labs Lab 06/19/15 0354  WBC 4.5  HGB 10.2*  HCT 30.8*  PLT 150    Chemistries   Recent Labs Lab 06/19/15 0354  NA 139  K 4.1  CL 103  CO2 29  GLUCOSE 187*  BUN 26*  CREATININE 1.06*  CALCIUM  8.5*   Microbiology Results  Results for orders placed or performed during the hospital encounter of 06/16/15  Wound culture     Status: None (Preliminary result)   Collection Time: 06/18/15 12:15 AM  Result Value Ref Range Status   Specimen Description TOE  Final   Special Requests Immunocompromised  Final   Gram Stain PENDING  Incomplete   Culture   Final    MODERATE GROWTH STAPHYLOCOCCUS AUREUS SUSCEPTIBILITIES TO FOLLOW    Report Status PENDING  Incomplete    Management plans discussed with the patient, family and they are in agreement.  CODE STATUS: Full code  TOTAL TIME TAKING CARE OF THIS PATIENT: 55 minutes.    [facility], Phala Schraeder M.D on 06/19/2015 at 4:24 PM  Between 7am to 6pm - Pager - [phone]  After 6pm go to [email or web address] - password [email or web address] [facility]  [place] [facility]  Office  [phone]  CC: Primary care physician; Nilda Simmer, MD Gwyneth Revels, DPM

## 2015-06-19 NOTE — Progress Notes (Signed)
Spoke with Dr. Manuella Ghazi & he will address the order rec. He never received my previous page.

## 2015-06-19 NOTE — Discharge Instructions (Signed)
Bone and Joint Infections °Joint infections are called septic or infectious arthritis. An infected joint may damage cartilage and tissue very quickly. This may destroy the joint. Bone infections (osteomyelitis) may last for years. Joints may become stiff if left untreated. Bacteria are the most common cause. Other causes include viruses and fungi, but these are more rare. Bone and joint infections usually come from injury or infection elsewhere in your body; the germs are carried to your bones or joints through the bloodstream.  °CAUSES  °· Blood-carried germs from an infection elsewhere in your body can eventually spread to a bone or joint. The germ staphylococcus is the most common cause of both osteomyelitis and septic arthritis. °· An injury can introduce germs into your bones or joints. °SYMPTOMS  °· Weight loss. °· Tiredness. °· Chills and fever. °· Bone or joint pain at rest and with activity. °· Tenderness when touching the area or bending the joint. °· Refusal to bear weight on a leg or inability to use an arm due to pain. °· Decreased range of motion in a joint. °· Skin redness, warmth, and tenderness. °· Open skin sores and drainage. °RISK FACTORS °Children, the elderly, and those with weak immune systems are at increased risk of bone and joint infections. It is more common in people with HIV infections and with people on chemotherapy. People are also at increased risk if they have surgery where metal implants are used to stabilize the bone. Plates, screws, or artificial joints provide a surface that bacteria can stick on. Such a growth of bacteria is called biofilm. The biofilm protects bacteria from antibiotics and bodily defenses. This allows germs to multiply. Other reasons for increased risks include:  °· Having previous surgery or injury of a bone or joint. °· Being on high-dose corticosteroids and immunosuppressive medications that weaken your body's resistance to germs. °· Diabetes and  long-standing diseases. °· Use of intravenous street drugs. °· Being on hemodialysis. °· Having a history of urinary tract infections. °· Removal of your spleen (splenectomy). This weakens your immunity. °· Chronic viral infections such as HIV or AIDS. °· Lack of sensation such as paraplegia, quadriplegia, or spina bifida. °DIAGNOSIS  °· Increased numbers of white blood cells in your blood may indicate infection. Some times your caregivers are able to identify the infecting germs by testing your blood. Inflammatory markers present in your bloodstream such as an erythrocyte sedimentation rate (ESR or sed rate) or c-reactive protein (CRP) can be indicators of deep infection. °· Bone scans and X-ray exams are necessary for diagnosing osteomyelitis. They may help your caregiver find the infected areas. Other studies may give more detailed information. They may help detect fluid collections around a joint, abnormal bone surfaces, or be useful in diagnosing septic arthritis. They can find soft tissue swelling and find excess fluid in an infected joint or the adjacent bone. These tests include: °¨ Ultrasound. °¨ CT (computerized tomography). °¨ MRI (magnetic resonance imaging). °· The best test for diagnosing a bone or joint infection is an aspiration or biopsy. Your caregiver will usually use a local anesthetic. He or she can then remove tissue from a bone injury or use a needle to take fluids from an infected joint. A local anesthetic medication numbs the area to be biopsied. Often biopsies are done in the operating room under general anesthesia. This means you will be asleep during the procedure. Tests performed on these samples can identify an infection. °TREATMENT  °· Treatment can help control long-standing   infections, but infections may come back.  Infections can infect any bone or joint at any age.  Bone and joint infections are rarely fatal.  Bone infection left untreated can become a never-ending infection.  It can spread to other areas of your body. It may eventually cause bone death. Reduced limb or joint function can result. In severe cases, this may require removal of a limb. Spinal osteomyelitis is very dangerous. Untreated, it may damage spinal nerves and cause death.  The most common complication of septic arthritis is osteoarthritis with pain and decreased range of motion of the joint. Some forms of treatment may include:  If the infection is caused by bacteria, it is generally treated with antibiotics. You will likely receive the drugs through a vein (intravenously) for anywhere from 2 to 6 weeks. In some cases, especially with children, oral antibiotics following an initial intravenous dose may be effective. The treatment you receive depends on the:  Type of bacteria.  Location of the infection.  Type of surgery that might be done.  Other health conditions or issues you might have.  Your caregiver may drain soft tissue abscesses or pockets of fluid around infected bones or joints. If you have septic arthritis, your caregiver may use a needle to drain pus from the joint on a daily basis. He or she may use an arthroscope to clean the joint or may need to open the joint surgically to remove damaged tissue and infection. An arthroscope is an instrument like a thin lighted telescope. It can be used to look inside the joint.  Surgery is usually needed if the infection has become long-standing. It may also be needed if there is hardware (such as metal plates, screws, or artificial joints) inside the patient. Sometimes a bone or muscle graft is needed to fill in the open space. This promotes growth of new tissues and better blood flow to the area. PREVENTION   Clean and disinfect wounds quickly to help prevent the start of a bone or joint infection. Get treatment for any infections to prevent spread to a bone or joint.  Do not smoke. Smoking decreases healing rates of bone and predisposes to  infection.  When given medications that suppress your immune system, use them according to your caregiver's instructions. Do not take more than prescribed for your condition.  Take good care of your feet and skin, especially if you have diabetes, decreased sensation or circulation problems. SEEK IMMEDIATE MEDICAL CARE IF:   You cannot bear weight on a leg or use an arm, especially following a minor injury. This can be a sign of bone or joint infection.  You think you may have signs or symptoms of a bone or joint infection. Your chance of getting rid of an infection is better if treated early. Document Released: 09/23/2005 Document Revised: 12/16/2011 Document Reviewed: 08/23/2009 [facility] Patient Information 2015 [place], [place]. This information is not intended to replace advice given to you by your health care provider. Make sure you discuss any questions you have with your health care provider.

## 2015-06-19 NOTE — Progress Notes (Addendum)
ANTIBIOTIC CONSULT NOTE - Follow Up  Pharmacy Consult for vancomycin dosing Indication: osteomyelitis  No Known Allergies  Patient Measurements: Height: 5\' 8"  (172.7 cm) Weight: 236 lb 3.2 oz (107.14 kg) IBW/kg (Calculated) : 63.9 Adjusted Body Weight: 80kg  Vital Signs: Temp: 97.8 F (36.6 C) (09/12 0735) Temp Source: Oral (09/12 0735) BP: 130/61 mmHg (09/12 0734) Pulse Rate: 82 (09/12 0734) Intake/Output from previous day: 09/11 0701 - 09/12 0700 In: 31 [P.O.:240; I.V.:250] Out: 750 [Urine:750]  Labs:  Recent Labs  06/16/15 1937 06/19/15 0354  WBC 5.6 4.5  HGB 10.6* 10.2*  PLT 152 150  CREATININE 1.51* 1.06*   Estimated Creatinine Clearance: 71.4 mL/min (by C-G formula based on Cr of 1.06). No results for input(s): VANCOTROUGH, VANCOPEAK, VANCORANDOM, GENTTROUGH, GENTPEAK, GENTRANDOM, TOBRATROUGH, TOBRAPEAK, TOBRARND, AMIKACINPEAK, AMIKACINTROU, AMIKACIN in the last 72 hours.   Microbiology: Recent Results (from the past 720 hour(s))  Wound culture     Status: None (Preliminary result)   Collection Time: 06/18/15 12:15 AM  Result Value Ref Range Status   Specimen Description TOE  Final   Special Requests Immunocompromised  Final   Gram Stain PENDING  Incomplete   Culture   Final    MODERATE GROWTH STAPHYLOCOCCUS AUREUS SUSCEPTIBILITIES TO FOLLOW    Report Status PENDING  Incomplete    Medical History: Past Medical History  Diagnosis Date  . Obesity, unspecified   . Unspecified vitamin D deficiency   . Pure hypercholesterolemia   . Neuropathy 2011  . Chicken pox   . Chronic systolic CHF (congestive heart failure)     a. mixed ICM & NICM; b. EF 20-25% by echo 07/2012, mid-dist 2/3 of LV sev HK/AK, mild MR. echo 10/2012: EF 30-35%, sev HK ant-septal & inf walls, GR1DD, mild MR, PASP 33. c. echo 02/2013: EF 30%, GR1DD, mild MR. echo 04/2014: EF 30%, Septal-lat dyssynchrony, global HK, inf AK, GR1DD, mild MR. d. echo 10/2014: EF50-55%, WM nl, GR1DD, septal mild  paradox. e. echo 02/2015: EF 50-55%, wm   . Hypertension   . Anemia   . Automatic implantable cardioverter-defibrillator in situ     a. MDT CRT-D 06/2014, [id number]    . Type II diabetes mellitus   . CAD (coronary artery disease)     a. cardiac cath 101/04/2012: PCI/DES to chronically occluded mLAD, consideration PCI to diag branch in 4 weeks.   Marland Kitchen History of blood transfusion ~ 2011    "plasma; had neuropathy; couldn't walk"  . OSA on CPAP     Moderate with AHI 23/hr and now on CPAP at 16cm H2O.  Her DME is [facility]  . LBBB (left bundle branch block)     Assessment: Pharmacy consulted to dose vancomycin for osteomyelitis of the toe in this 59 yo diabetic female.   Wound Cx Pending: MODERATE GROWTH STAPHYLOCOCCUS AUREUS  SUSCEPTIBILITIES TO FOLLOW  Osteomyelitis of toe on imaging  Goal of Therapy:  Vancomycin trough level 15-20 mcg/ml  Plan:  TBW 103.4kg  IBW 63.9kg  DW 76.6kg  Vd 56L Ke= 0.06 hr-1  T1/2= 11.5 hours  As renal function has improved since vancomycin initiation, change regimen to 1250 mg q12 hours.   Estimated Cpk= 28.8 Estimated Ctr= 16.75  Next trough scheduled for prior to 4th dose of new regimen at 2230 on 09/13.  Roe Coombs, PharmD  06/19/2015

## 2015-06-20 LAB — WOUND CULTURE

## 2015-06-20 LAB — SURGICAL PATHOLOGY

## 2015-06-22 ENCOUNTER — Encounter: Payer: PPO | Admitting: Surgery

## 2015-06-22 DIAGNOSIS — N289 Disorder of kidney and ureter, unspecified: Secondary | ICD-10-CM | POA: Diagnosis not present

## 2015-06-22 DIAGNOSIS — I10 Essential (primary) hypertension: Secondary | ICD-10-CM | POA: Diagnosis not present

## 2015-06-22 DIAGNOSIS — L97512 Non-pressure chronic ulcer of other part of right foot with fat layer exposed: Secondary | ICD-10-CM | POA: Diagnosis present

## 2015-06-22 DIAGNOSIS — E11621 Type 2 diabetes mellitus with foot ulcer: Secondary | ICD-10-CM | POA: Diagnosis not present

## 2015-06-22 DIAGNOSIS — L97511 Non-pressure chronic ulcer of other part of right foot limited to breakdown of skin: Secondary | ICD-10-CM | POA: Diagnosis not present

## 2015-06-22 DIAGNOSIS — E785 Hyperlipidemia, unspecified: Secondary | ICD-10-CM | POA: Diagnosis not present

## 2015-06-22 DIAGNOSIS — M86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, left ankle and foot: Secondary | ICD-10-CM | POA: Diagnosis not present

## 2015-06-22 DIAGNOSIS — R0902 Hypoxemia: Secondary | ICD-10-CM | POA: Diagnosis not present

## 2015-06-22 DIAGNOSIS — G4733 Obstructive sleep apnea (adult) (pediatric): Secondary | ICD-10-CM | POA: Diagnosis not present

## 2015-06-22 DIAGNOSIS — I5022 Chronic systolic (congestive) heart failure: Secondary | ICD-10-CM | POA: Diagnosis not present

## 2015-06-22 DIAGNOSIS — E1142 Type 2 diabetes mellitus with diabetic polyneuropathy: Secondary | ICD-10-CM | POA: Diagnosis not present

## 2015-06-22 NOTE — Progress Notes (Signed)
ZABDI, DEBERARDINIS ([id number]) Visit Report for 06/22/2015 Arrival Information Details Patient Name: [name], [name] Date of Service: 06/22/2015 8:45 AM Medical Record Number: [id number] Patient Account Number: [id number] Date of Birth/Sex: Jun 07, 1956 (59 y.o. Female) Treating RN: Afful, RN, BSN, Velva Harman Primary Care Physician: [name] Other Clinician: Referring Physician: [name] Treating Physician/Extender: Frann Rider in Treatment: 23 Visit Information History Since Last Visit Any new allergies or adverse reactions: No Patient Arrived: Wheel Chair Had a fall or experienced change in No Arrival Time: 08:52 activities of daily living that may affect Accompanied By: spouse risk of falls: Transfer Assistance: None Signs or symptoms of abuse/neglect since last No Patient Identification Verified: Yes visito Secondary Verification Process Yes Hospitalized since last visit: No Completed: Pain Present Now: No Patient Requires Transmission- No Based Precautions: Patient Has Alerts: Yes Patient Alerts: Patient on Blood Thinner ABI L:0.8, R:0.93 Electronic Signature(s) Signed: 06/22/2015 8:52:30 AM By: Regan Lemming BSN, RN Entered By: Regan Lemming on 06/22/2015 08:52:30 [name] ([id number]) -------------------------------------------------------------------------------- Clinic Level of Care Assessment Details Patient Name: [name] Date of Service: 06/22/2015 8:45 AM Medical Record Number: [id number] Patient Account Number: [id number] Date of Birth/Sex: 03/08/1956 (59 y.o. Female) Treating RN: Afful, RN, BSN, Sierra Primary Care Physician: [name] Other Clinician: Referring Physician: [name] Treating Physician/Extender: Frann Rider in Treatment: 23 Clinic Level of Care Assessment Items TOOL 4 Quantity Score []  - Use when only an EandM is performed on FOLLOW-UP visit 0 ASSESSMENTS - Nursing Assessment / Reassessment []  -  Reassessment of Co-morbidities (includes updates in patient status) 0 []  - Reassessment of Adherence to Treatment Plan 0 ASSESSMENTS - Wound and Skin Assessment / Reassessment []  - Simple Wound Assessment / Reassessment - one wound 0 []  - Complex Wound Assessment / Reassessment - multiple wounds 0 []  - Dermatologic / Skin Assessment (not related to wound area) 0 ASSESSMENTS - Focused Assessment []  - Circumferential Edema Measurements - multi extremities 0 []  - Nutritional Assessment / Counseling / Intervention 0 []  - Lower Extremity Assessment (monofilament, tuning fork, pulses) 0 []  - Peripheral Arterial Disease Assessment (using hand held doppler) 0 ASSESSMENTS - Ostomy and/or Continence Assessment and Care []  - Incontinence Assessment and Management 0 []  - Ostomy Care Assessment and Management (repouching, etc.) 0 PROCESS - Coordination of Care []  - Simple Patient / Family Education for ongoing care 0 []  - Complex (extensive) Patient / Family Education for ongoing care 0 []  - Staff obtains Programmer, systems, Records, Test Results / Process Orders 0 []  - Staff telephones HHA, Nursing Homes / Clarify orders / etc 0 []  - Routine Transfer to another Facility (non-emergent condition) 0 SHANYA, ARMWOOD ([id number]) []  - Routine Hospital Admission (non-emergent condition) 0 []  - New Admissions / Biomedical engineer / Ordering NPWT, Apligraf, etc. 0 []  - Emergency Hospital Admission (emergent condition) 0 []  - Simple Discharge Coordination 0 []  - Complex (extensive) Discharge Coordination 0 PROCESS - Special Needs []  - Pediatric / Minor Patient Management 0 []  - Isolation Patient Management 0 []  - Hearing / Language / Visual special needs 0 []  - Assessment of Community assistance (transportation, D/C planning, etc.) 0 []  - Additional assistance / Altered mentation 0 []  - Support Surface(s) Assessment (bed, cushion, seat, etc.) 0 INTERVENTIONS - Wound Cleansing / Measurement []  - Simple  Wound Cleansing - one wound 0 []  - Complex Wound Cleansing - multiple wounds 0 []  - Wound Imaging (photographs - any number of wounds) 0 []  - Wound Tracing (instead of  ZABDI, DEBERARDINIS ([id number]) Visit Report for 06/22/2015 Arrival Information Details Patient Name: [name], [name] Date of Service: 06/22/2015 8:45 AM Medical Record Number: [id number] Patient Account Number: [id number] Date of Birth/Sex: Jun 07, 1956 (59 y.o. Female) Treating RN: Afful, RN, BSN, Velva Harman Primary Care Physician: [name] Other Clinician: Referring Physician: [name] Treating Physician/Extender: Frann Rider in Treatment: 23 Visit Information History Since Last Visit Any new allergies or adverse reactions: No Patient Arrived: Wheel Chair Had a fall or experienced change in No Arrival Time: 08:52 activities of daily living that may affect Accompanied By: spouse risk of falls: Transfer Assistance: None Signs or symptoms of abuse/neglect since last No Patient Identification Verified: Yes visito Secondary Verification Process Yes Hospitalized since last visit: No Completed: Pain Present Now: No Patient Requires Transmission- No Based Precautions: Patient Has Alerts: Yes Patient Alerts: Patient on Blood Thinner ABI L:0.8, R:0.93 Electronic Signature(s) Signed: 06/22/2015 8:52:30 AM By: Regan Lemming BSN, RN Entered By: Regan Lemming on 06/22/2015 08:52:30 [name] ([id number]) -------------------------------------------------------------------------------- Clinic Level of Care Assessment Details Patient Name: [name] Date of Service: 06/22/2015 8:45 AM Medical Record Number: [id number] Patient Account Number: [id number] Date of Birth/Sex: 03/08/1956 (59 y.o. Female) Treating RN: Afful, RN, BSN, Sierra Primary Care Physician: [name] Other Clinician: Referring Physician: [name] Treating Physician/Extender: Frann Rider in Treatment: 23 Clinic Level of Care Assessment Items TOOL 4 Quantity Score []  - Use when only an EandM is performed on FOLLOW-UP visit 0 ASSESSMENTS - Nursing Assessment / Reassessment []  -  Reassessment of Co-morbidities (includes updates in patient status) 0 []  - Reassessment of Adherence to Treatment Plan 0 ASSESSMENTS - Wound and Skin Assessment / Reassessment []  - Simple Wound Assessment / Reassessment - one wound 0 []  - Complex Wound Assessment / Reassessment - multiple wounds 0 []  - Dermatologic / Skin Assessment (not related to wound area) 0 ASSESSMENTS - Focused Assessment []  - Circumferential Edema Measurements - multi extremities 0 []  - Nutritional Assessment / Counseling / Intervention 0 []  - Lower Extremity Assessment (monofilament, tuning fork, pulses) 0 []  - Peripheral Arterial Disease Assessment (using hand held doppler) 0 ASSESSMENTS - Ostomy and/or Continence Assessment and Care []  - Incontinence Assessment and Management 0 []  - Ostomy Care Assessment and Management (repouching, etc.) 0 PROCESS - Coordination of Care []  - Simple Patient / Family Education for ongoing care 0 []  - Complex (extensive) Patient / Family Education for ongoing care 0 []  - Staff obtains Programmer, systems, Records, Test Results / Process Orders 0 []  - Staff telephones HHA, Nursing Homes / Clarify orders / etc 0 []  - Routine Transfer to another Facility (non-emergent condition) 0 SHANYA, ARMWOOD ([id number]) []  - Routine Hospital Admission (non-emergent condition) 0 []  - New Admissions / Biomedical engineer / Ordering NPWT, Apligraf, etc. 0 []  - Emergency Hospital Admission (emergent condition) 0 []  - Simple Discharge Coordination 0 []  - Complex (extensive) Discharge Coordination 0 PROCESS - Special Needs []  - Pediatric / Minor Patient Management 0 []  - Isolation Patient Management 0 []  - Hearing / Language / Visual special needs 0 []  - Assessment of Community assistance (transportation, D/C planning, etc.) 0 []  - Additional assistance / Altered mentation 0 []  - Support Surface(s) Assessment (bed, cushion, seat, etc.) 0 INTERVENTIONS - Wound Cleansing / Measurement []  - Simple  Wound Cleansing - one wound 0 []  - Complex Wound Cleansing - multiple wounds 0 []  - Wound Imaging (photographs - any number of wounds) 0 []  - Wound Tracing (instead of  photographs) 0 []  - Simple Wound Measurement - one wound 0 []  - Complex Wound Measurement - multiple wounds 0 INTERVENTIONS - Wound Dressings []  - Small Wound Dressing one or multiple wounds 0 []  - Medium Wound Dressing one or multiple wounds 0 []  - Large Wound Dressing one or multiple wounds 0 []  - Application of Medications - topical 0 []  - Application of Medications - injection 0 INTERVENTIONS - Miscellaneous []  - External ear exam 0 DAROTHY, CHAPPELLE ([id number]) []  - Specimen Collection (cultures, biopsies, blood, body fluids, etc.) 0 []  - Specimen(s) / Culture(s) sent or taken to Lab for analysis 0 []  - Patient Transfer (multiple staff / Harrel Lemon Lift / Similar devices) 0 []  - Simple Staple / Suture removal (25 or less) 0 []  - Complex Staple / Suture removal (26 or more) 0 []  - Hypo / Hyperglycemic Management (close monitor of Blood Glucose) 0 []  - Ankle / Brachial Index (ABI) - do not check if billed separately 0 X - Vital Signs 1 5 Has the patient been seen at the hospital within the last three years: Yes Total Score: 5 Level Of Care: New/Established - Level 1 Electronic Signature(s) Signed: 06/22/2015 9:09:14 AM By: Regan Lemming BSN, RN Entered By: Regan Lemming on 06/22/2015 09:09:13 [name] ([id number]) -------------------------------------------------------------------------------- Encounter Discharge Information Details Patient Name: [name] Date of Service: 06/22/2015 8:45 AM Medical Record Number: [id number] Patient Account Number: [id number] Date of Birth/Sex: October 15, 1955 (59 y.o. Female) Treating RN: Baruch Gouty, RN, BSN, Velva Harman Primary Care Physician: [name] Other Clinician: Referring Physician: [name] Treating Physician/Extender: Frann Rider in Treatment:  67 Encounter Discharge Information Items Discharge Pain Level: 0 Discharge Condition: Stable Ambulatory Status: Wheelchair Discharge Destination: Home Transportation: Private Auto Accompanied By: husband Schedule Follow-up Appointment: No Medication Reconciliation completed and provided to Patient/Care No Rayquon Uselman: Provided on Clinical Summary of Care: 06/22/2015 Form Type Recipient Paper Patient BG Electronic Signature(s) Signed: 06/22/2015 9:10:20 AM By: Regan Lemming BSN, RN Previous Signature: 06/22/2015 9:07:54 AM Version By: Ruthine Dose Entered By: Regan Lemming on 06/22/2015 09:10:20 [name] ([id number]) -------------------------------------------------------------------------------- General Visit Notes Details Patient Name: [name] Date of Service: 06/22/2015 8:45 AM Medical Record Number: [id number] Patient Account Number: [id number] Date of Birth/Sex: Dec 27, 1955 (59 y.o. Female) Treating RN: Baruch Gouty, RN, BSN, Velva Harman Primary Care Physician: [name] Other Clinician: Referring Physician: [name] Treating Physician/Extender: Frann Rider in Treatment: 23 Notes 06/22/15 Patient seen today at wound clinic, post amputation of third toe. DPM to follow up with patient. Patient will call [facility] if any wound issues. Electronic Signature(s) Signed: 06/22/2015 9:06:09 AM By: Regan Lemming BSN, RN Entered By: Regan Lemming on 06/22/2015 09:06:08 [name] ([id number]) -------------------------------------------------------------------------------- Lower Extremity Assessment Details Patient Name: [name] Date of Service: 06/22/2015 8:45 AM Medical Record Number: [id number] Patient Account Number: [id number] Date of Birth/Sex: 1956-08-29 (59 y.o. 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 Surgical dressing in place.Has an appointment wit Dr. Vickki Muff, DPM today at 1130am. Partial lower extremity assessment performed. Electronic Signature(s) Signed: 06/22/2015 9:02:16 AM By: Regan Lemming BSN, RN Entered By: Regan Lemming on 06/22/2015 09:02:16 [name] ([id number]) -------------------------------------------------------------------------------- Multi Wound Chart Details Patient Name: [name]  ZABDI, DEBERARDINIS ([id number]) Visit Report for 06/22/2015 Arrival Information Details Patient Name: [name], [name] Date of Service: 06/22/2015 8:45 AM Medical Record Number: [id number] Patient Account Number: [id number] Date of Birth/Sex: Jun 07, 1956 (59 y.o. Female) Treating RN: Afful, RN, BSN, Velva Harman Primary Care Physician: [name] Other Clinician: Referring Physician: [name] Treating Physician/Extender: Frann Rider in Treatment: 23 Visit Information History Since Last Visit Any new allergies or adverse reactions: No Patient Arrived: Wheel Chair Had a fall or experienced change in No Arrival Time: 08:52 activities of daily living that may affect Accompanied By: spouse risk of falls: Transfer Assistance: None Signs or symptoms of abuse/neglect since last No Patient Identification Verified: Yes visito Secondary Verification Process Yes Hospitalized since last visit: No Completed: Pain Present Now: No Patient Requires Transmission- No Based Precautions: Patient Has Alerts: Yes Patient Alerts: Patient on Blood Thinner ABI L:0.8, R:0.93 Electronic Signature(s) Signed: 06/22/2015 8:52:30 AM By: Regan Lemming BSN, RN Entered By: Regan Lemming on 06/22/2015 08:52:30 [name] ([id number]) -------------------------------------------------------------------------------- Clinic Level of Care Assessment Details Patient Name: [name] Date of Service: 06/22/2015 8:45 AM Medical Record Number: [id number] Patient Account Number: [id number] Date of Birth/Sex: 03/08/1956 (59 y.o. Female) Treating RN: Afful, RN, BSN, Sierra Primary Care Physician: [name] Other Clinician: Referring Physician: [name] Treating Physician/Extender: Frann Rider in Treatment: 23 Clinic Level of Care Assessment Items TOOL 4 Quantity Score []  - Use when only an EandM is performed on FOLLOW-UP visit 0 ASSESSMENTS - Nursing Assessment / Reassessment []  -  Reassessment of Co-morbidities (includes updates in patient status) 0 []  - Reassessment of Adherence to Treatment Plan 0 ASSESSMENTS - Wound and Skin Assessment / Reassessment []  - Simple Wound Assessment / Reassessment - one wound 0 []  - Complex Wound Assessment / Reassessment - multiple wounds 0 []  - Dermatologic / Skin Assessment (not related to wound area) 0 ASSESSMENTS - Focused Assessment []  - Circumferential Edema Measurements - multi extremities 0 []  - Nutritional Assessment / Counseling / Intervention 0 []  - Lower Extremity Assessment (monofilament, tuning fork, pulses) 0 []  - Peripheral Arterial Disease Assessment (using hand held doppler) 0 ASSESSMENTS - Ostomy and/or Continence Assessment and Care []  - Incontinence Assessment and Management 0 []  - Ostomy Care Assessment and Management (repouching, etc.) 0 PROCESS - Coordination of Care []  - Simple Patient / Family Education for ongoing care 0 []  - Complex (extensive) Patient / Family Education for ongoing care 0 []  - Staff obtains Programmer, systems, Records, Test Results / Process Orders 0 []  - Staff telephones HHA, Nursing Homes / Clarify orders / etc 0 []  - Routine Transfer to another Facility (non-emergent condition) 0 SHANYA, ARMWOOD ([id number]) []  - Routine Hospital Admission (non-emergent condition) 0 []  - New Admissions / Biomedical engineer / Ordering NPWT, Apligraf, etc. 0 []  - Emergency Hospital Admission (emergent condition) 0 []  - Simple Discharge Coordination 0 []  - Complex (extensive) Discharge Coordination 0 PROCESS - Special Needs []  - Pediatric / Minor Patient Management 0 []  - Isolation Patient Management 0 []  - Hearing / Language / Visual special needs 0 []  - Assessment of Community assistance (transportation, D/C planning, etc.) 0 []  - Additional assistance / Altered mentation 0 []  - Support Surface(s) Assessment (bed, cushion, seat, etc.) 0 INTERVENTIONS - Wound Cleansing / Measurement []  - Simple  Wound Cleansing - one wound 0 []  - Complex Wound Cleansing - multiple wounds 0 []  - Wound Imaging (photographs - any number of wounds) 0 []  - Wound Tracing (instead of

## 2015-06-23 NOTE — Progress Notes (Signed)
[name], [name] ([id number]) Visit Report for 06/22/2015 Chief Complaint Document Details Patient Name: [name], [name] Date of Service: 06/22/2015 8:45 AM Medical Record Number: [id number] Patient Account Number: [id number] Date of Birth/Sex: 11-21-1955 (59 y.o. Female) Treating RN: Afful, RN, BSN, Velva Harman Primary Care Physician: [name] Other Clinician: Referring Physician: [name] Treating Physician/Extender: Frann Rider in Treatment: 28 Information Obtained from: Patient Chief Complaint Patient presents to the wound care center for a consult due non healing wound 59 year old patient who comes with a history of an ulcer on the left second toe for at least about 4 months and a history on her left heel for about 3 weeks. I needed to get an MRI of her left foot but due to her defibrillator this could not be done and hence she is getting get a triple phase bone scan. 01/17/2015 -- she has developed a significant injury to the bridge of the nose where her CPAP machine was fitting. This is a painfull dark spot. Electronic Signature(s) Signed: 06/22/2015 9:22:53 AM By: Christin Fudge MD, FACS Entered By: Christin Fudge on 06/22/2015 09:22:53 [name] ([id number]) -------------------------------------------------------------------------------- HPI Details Patient Name: [name] Date of Service: 06/22/2015 8:45 AM Medical Record Number: [id number] Patient Account Number: [id number] Date of Birth/Sex: [birth year] (59 y.o. Female) Treating RN: Afful, RN, BSN, Velva Harman Primary Care Physician: [name] Other Clinician: Referring Physician: [name] Treating Physician/Extender: Frann Rider in Treatment: 61 History of Present Illness HPI Description: 59 year old patient who is known to have diabetes for several years and generally has it under good control last hemoglobin A1c was 6.7 done 2 weeks ago. She has several other  comorbidities including acute CHF, hypoxia, hyperlipidemia, systolic heart failure, myocardial infarction, coronary artery disease, iron deficiency anemia, renal insufficiency, essential hypertension, morbid obesity and obstructive sleep apnea. 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 In the past she's had a coronary angioplasty with stent in 2013, laparoscopic cholecystectomy in 2011, bilateral oophorectomy in April 2012 and she's had some lumbar disc surgery in 1996. She says she's had a lot of peripheral neuropathy and due to this she's had a ulcer on her left second toe for a while. She had a dry eschar on her left heel and recently this was removed and she found that she had some infection and some drainage from there. She has minimal pain around the heel but no pain on any areas of her toes. she has not been on any antibiotics recently and has known vascular problems in the recent past. 01/17/2015 left foot x-ray done on April 5 reveals there are destructive changes noted of the distal aspect of the proximal phalanx of the left second digit with extension into the adjacent proximal interphalangeal joint space. These findings suggest osteomyelitis with possible septic arthritis. I was able to make a call to Dr. Nevada Crane the radiologist, and had a detailed discussion with him regarding this x-ray. He noted that there were also some changes in the fifth phalanx on the left foot and this could have been chronic osteomyelitis too. The second toe on the left leg shows findings which cannot differentiate between an acute or chronic osteomyelitis and hence he has recommended a MRI with and without contrast. We will get this organized for her and in the meanwhile treat her with oral antibiotics which is susceptible to Bactrim. She was seen yesterday by her PCP Dr. Chaya Jan who put her on 3 antibiotics which include  Levaquin and doxycycline and  Bactrim. 01/24/2015 She is scheduled for a triple PHASE bone scan on 01/26/2015. She is still on antibiotics and otherwise doing well. 01/31/2015 Bone Imaging 3 phase study -- IMPRESSION:1. There is increased uptake on all 3 phases localizing to the left hindfoot region. This may be a manifestation of osteomyelitis. Correlation with exact site of nonhealing ulcer. 2. There is increased uptake localizing to the second phalanx of the left foot on the blood pool and delayed phase images. 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 Underlying infection not excluded. I have personally discussed the x-ray studies with the radiologist Dr. Kerby Moors and after comparing all her films he agrees that the left second phalanx does not have any definite underlying infection and this is [name], [name]. ([id number]) now off a chronic arthropathy. There is a increased uptake in all 3 phases localized to the left hindfoot region and this is a manifestation of osteomyelitis. 02/07/2015 -- She was seen by the ID specialist Dr. Ola Spurr who reviewed her on my request recently. He has recommended a PICC line and IV vancomycin and oral ciprofloxacin for 4 weeks. She will be reviewed by him periodically. He also received several notes from Dr. Sonia Baller to help as documented all the requisites for hyperbaric oxygen therapy. 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 He also had a coronary angioplasty with stent placement in October 2013. Prior to that her 2-D echo showed EF of 35%. Her latest 2-D echo done on 11/01/2014 showed a LV EF of 50-55%. Systolic function and wall motion were within normal limits. Chest x-ray done in January 16, 2015 showed no acute findings. Last hemoglobin A1c done on 12/30/2014 was 6.7. CBC done on the same day was within normal limits, except for a hemoglobin  being 8.7 and a hematocrit between 29%. She had arterial duplex study done in August 2015 which showed: No evidence of segmental lower extremity arterial disease at rest, bilaterally. Normal ABI's, bilaterally. Normal great toe pressures, bilaterally. With all the above in place I believe she is a very good candidate for hyperbaric oxygen therapy and I would recommend this with the usual protocol for 6 weeks. she is going to get her PICC line this Friday and the IV antibiotics and when to start. She is also starting on oral ciprofloxacin. 03/03/2015 - recently hospitalized for anemia. No source identified. Started on amiodarone, contraindication to HBO. Reports left heel feels better. Main complaint is new buttocks ulcers. No fever or chills. Minimal drainage. 03/10/2015 -- There is a new ulceration noticed yesterday on her right second toe at the tip. She also has noticed some ulcerations on her gluteal region and we will need to take a look at this. As far as her medications go she does not know for sure what her dosage of amiodarone is exactly. She will look it up and let me know today. 03/17/2015 -- as far as her dosage of amiodarone she was taking : 02/25/15 - 03/02/15 400 mg per day; 03/03/15 - 03/10/15 200 mg per day. She stopped taking the medication on 03/10/15. She has not yet done the checks x-ray and her PFTs are still pending. Recent Lab work done on 03/13/2015 shows a glucose is 124, K+ was 3.3, her albumen was 2.9, WBC count was 3.4, hemoglobin was 9.3, hematocrit was 30.4 and platelets were 240. C-reactive protein was 33.7 which was  high and the Vancomycin trough was 18.6 which was high. 03/24/2015 -- he saw several doctors since I saw her last and they include her medical oncologist, cardiologist, neurologist and infectious disease doctor. Her PICC line is out and she now is on oral Cipro and doxycycline as per her verbal report. She has completed her pulmonary function test yesterday  and a recent chest x-ray is within normal limits. If the PFTs within normal limits she will be able to restart the hyperbaric oxygen therapy. 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 Underlying interstitial lung disease to be considered. Further recommendations were that of a CT chest and pulmonary consult if indicated. 05/11/2015 -- due to some discomfort in her ears during hyperbaric oxygen therapy, she saw ENT yesterday who basically said everything was good and continue with hyperbaric therapy. She has minimal redness of her right second toe but she is still on ciprofloxacin. [name], [name] ([id number]) 05/18/2015 -- her nose is almost completely healed and so is the sacral area. She still has an open wound on the right second toe which needs further care. He has just completed a course of ciprofloxacin. 06/08/2015 -- she developed a blister on her right third toe on the dorsum and this has opened out and has had skin breakdown. 06/15/2015 -- When the dressing was taken down today, there was a redness noted of the second toe on the right side and mild cellulitis of the forefoot. No pus discharge or fever. 06/22/2015 --X-ray of the right foot: IMPRESSION: Findings compatible with osteomyelitis of the tuft of the distal phalanx of the second toe. There is overlying soft tissue swelling. She is unable to have her MRI and hence I will get a 3-phase bone scan to confirm this. She will also need to see Dr. Ola Spurr soon. If osteomyelitis is confirmed she will benefit from additional hyperbaric oxygen therapy. She was recently admitted to the hospital and on 06/18/2015 she was taken up for surgery by Dr. Samara Deist. Preop diagnosis: Osteomyelitis distal right second toe. Postop diagnosis: Same Procedure: Amputation right second toe at PIPJ He was discharged home  on doxycycline and ciprofloxacin and will follow-up with Dr. Vickki Muff. She called our office and we have asked her to come and see as appropriately. Pathology DIAGNOSIS: A. RIGHT SECOND TOE; AMPUTATION: - SKIN AND SOFT TISSUE WITH ULCERATION AND ABSCESS. - UNDERLYING BONE WITH OSTEOMYELITIS. -THE BONY AND SOFT TISSUE MARGINS ARE VIABLE AND UNINVOLVED. The patient is here today with no problems and intact dressing on her right foot. She has concerns though about her gluteal area and the natal cleft and has come to get this reviewed. Electronic Signature(s) Signed: 06/22/2015 9:25:40 AM By: Christin Fudge MD, FACS Entered By: Christin Fudge on 06/22/2015 09:25:40 [name] ([id number]) -------------------------------------------------------------------------------- Physical Exam Details Patient Name: [name] Date of Service: 06/22/2015 8:45 AM Medical Record Number: [id number] Patient Account Number: [id number] Date of Birth/Sex: 04/13/1956 (59 y.o. Female) Treating RN: Baruch Gouty, RN, BSN, Velva Harman Primary Care Physician: [name] Other Clinician: Referring Physician: [name] Treating Physician/Extender: Frann Rider in Treatment: 23 Constitutional . Pulse regular. Respirations normal and unlabored. Afebrile. . Eyes Nonicteric. Reactive to light. Ears, Nose, Mouth, and Throat Lips, teeth, and gums WNL.Marland Kitchen Moist mucosa without lesions . Neck supple and nontender. No palpable supraclavicular or cervical adenopathy. Normal sized without goiter. Respiratory WNL. No retractions.. Cardiovascular Pedal Pulses WNL.  No clubbing, cyanosis or edema. Lymphatic No adneopathy. No adenopathy. No adenopathy. Musculoskeletal Adexa without tenderness or enlargement.. Digits and nails w/o clubbing, cyanosis, infection, petechiae, ischemia, or inflammatory conditions.. Integumentary (Hair, Skin) No suspicious lesions. No crepitus or fluctuance. No peri-wound warmth or erythema.  No masses.Marland Kitchen Psychiatric Judgement and insight Intact.. No evidence of depression, anxiety, or agitation.. Notes She has a intact dressing on her right foot and this has not been opened because it is postsurgical. The gluteal area has good skin closure and a mild dimpling of skin in the center of the natal cleft and this is not purulent drainage or any discharge. Electronic Signature(s) Signed: 06/22/2015 9:26:55 AM By: Christin Fudge MD, FACS Entered By: Christin Fudge on 06/22/2015 09:26:55 [name] ([id number]) -------------------------------------------------------------------------------- Physician Orders Details Patient Name: [name] Date of Service: 06/22/2015 8:45 AM Medical Record Number: [id number] Patient Account Number: [id number] Date of Birth/Sex: Oct 05, 1956 (59 y.o. Female) Treating RN: Afful, RN, BSN, Velva Harman Primary Care Physician: [name] Other Clinician: Referring Physician: [name] Treating Physician/Extender: Frann Rider in Treatment: 23 Verbal / Phone Orders: Yes Clinician: Afful, RN, BSN, Rita Read Back and Verified: Yes Diagnosis Coding Follow-up Appointments Wound #7 Right Toe Second o Return Appointment in 1 month Wound #8 Right,Dorsal Toe Third o Return Appointment in 1 month Additional Orders / Instructions Wound #7 Right Toe Second o Other: - TO be followed by Dr. Vickki Muff. Patient had amputation of 3rd toe. Surgical dressing still in place. Wound #8 Right,Dorsal Toe Third o Other: - TO be followed by Dr. Vickki Muff. Patient had amputation of 3rd toe. Surgical dressing still in place. Electronic Signature(s) Signed: 06/22/2015 9:08:09 AM By: Regan Lemming BSN, RN Signed: 06/22/2015 4:21:04 PM By: Christin Fudge MD, FACS Entered By: Regan Lemming on 06/22/2015 09:08:09 [name] ([id number]) -------------------------------------------------------------------------------- Problem List Details Patient Name:  [name] Date of Service: 06/22/2015 8:45 AM Medical Record Number: [id number] Patient Account Number: [id number] Date of Birth/Sex: 21-Jun-1956 (59 y.o. Female) Treating RN: Afful, RN, BSN, Gooding Primary Care Physician: [name] Other Clinician: Referring Physician: [name] Treating Physician/Extender: Frann Rider in Treatment: 23 Active Problems ICD-10 Encounter Code Description Active Date Diagnosis E11.621 Type 2 diabetes mellitus with foot ulcer 01/10/2015 Yes [id number] Chronic systolic (congestive) heart failure 01/10/2015 Yes E11.42 Type 2 diabetes mellitus with diabetic polyneuropathy 01/10/2015 Yes 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, left ankle and foot 02/13/2015 Yes L97.512 Non-pressure chronic ulcer of other part of right foot with 03/10/2015 Yes fat layer exposed L97.511 Non-pressure chronic ulcer of other part of right foot 06/08/2015 Yes limited to breakdown of skin L03.115 Cellulitis of right lower limb 06/15/2015 Yes M86.671 Other chronic osteomyelitis, right ankle and foot 06/22/2015 Yes Inactive Problems Resolved Problems ICD-10 Code Description Active Date Resolved Date L97.422 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 01/10/2015 01/10/2015 layer exposed JOURDYN, BORKENHAGEN ([id number]) [phone] Non-pressure chronic ulcer of other part of left foot with fat 01/10/2015 01/10/2015 layer exposed S00.33XA Contusion of nose, initial encounter 01/17/2015 01/17/2015 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, stage 3 03/03/2015 03/03/2015 L89.323 Pressure ulcer of left buttock, stage 3 03/03/2015 03/03/2015 L02.611 Cutaneous abscess of right foot 04/20/2015 04/20/2015 Electronic Signature(s) Signed: 06/22/2015 9:22:47 AM By: Christin Fudge MD, FACS Entered By: Christin Fudge on 06/22/2015 09:22:47 [name] ([id number]) -------------------------------------------------------------------------------- Progress Note Details Patient Name:  [name] Date of Service: 06/22/2015 8:45 AM Medical Record Number: [id number] Patient Account Number: [id number] Date of Birth/Sex: [birth year] (59 y.o.  Female) Treating RN: Afful, RN, BSN, Velva Harman Primary Care Physician: [name] Other Clinician: Referring Physician: [name] Treating Physician/Extender: Frann Rider in Treatment: 20 Subjective Chief Complaint Information obtained from Patient Patient presents to the wound care center for a consult due non healing wound 59 year old patient who comes with a history of an ulcer on the left second toe for at least about 4 months and a history on her left heel for about 3 weeks. I needed to get an MRI of her left foot but due to her defibrillator this could not be done and hence she is getting get a triple phase bone scan. 01/17/2015 -- she has developed a significant injury to the bridge of the nose where her CPAP machine was fitting. This is a painfull dark spot. History of Present Illness (HPI) 59 year old patient who is known to have diabetes for several years and generally has it under good control last hemoglobin A1c was 6.7 done 2 weeks ago. She has several other comorbidities including acute CHF, hypoxia, hyperlipidemia, systolic heart failure, myocardial infarction, coronary artery disease, iron deficiency anemia, renal insufficiency, essential hypertension, morbid obesity and obstructive sleep apnea. 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 In the past she's had a coronary angioplasty with stent in 2013, laparoscopic cholecystectomy in 2011, bilateral oophorectomy in April 2012 and she's had some lumbar disc surgery in 1996. She says she's had a lot of peripheral neuropathy and due to this she's had a ulcer on her left second toe for a while. She had a dry eschar on her left heel and recently this was removed and she found that she had some infection and some  drainage from there. She has minimal pain around the heel but no pain on any areas of her toes. she has not been on any antibiotics recently and has known vascular problems in the recent past. 01/17/2015 left foot x-ray done on April 5 reveals there are destructive changes noted of the distal aspect of the proximal phalanx of the left second digit with extension into the adjacent proximal interphalangeal joint space. These findings suggest osteomyelitis with possible septic arthritis. I was able to make a call to Dr. Nevada Crane the radiologist, and had a detailed discussion with him regarding this x-ray. He noted that there were also some changes in the fifth phalanx on the left foot and this could have been chronic osteomyelitis too. The second toe on the left leg shows findings which cannot differentiate between an acute or chronic osteomyelitis and hence he has recommended a MRI with and without contrast. We will get this organized for her and in the meanwhile treat her with oral antibiotics which is susceptible to Bactrim. She was seen yesterday by her PCP Dr. Chaya Jan who put her on 3 antibiotics which include Levaquin and doxycycline and Bactrim. 01/24/2015 She is scheduled for a triple PHASE bone scan on 01/26/2015. She is still on antibiotics and otherwise doing well. [name], [name] ([id number]) 01/31/2015 Bone Imaging 3 phase study -- IMPRESSION:1. There is increased uptake on all 3 phases localizing to the left hindfoot region. This may be a manifestation of osteomyelitis. Correlation with exact site of nonhealing ulcer. 2. There is increased uptake localizing to the second phalanx of the left foot on the blood pool and delayed phase images. 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 Underlying infection not excluded. I have personally discussed the x-ray studies with  the radiologist Dr. Kerby Moors and after comparing all her films  he agrees that the left second phalanx does not have any definite underlying infection and this is now off a chronic arthropathy. There is a increased uptake in all 3 phases localized to the left hindfoot region and this is a manifestation of osteomyelitis. 02/07/2015 -- She was seen by the ID specialist Dr. Ola Spurr who reviewed her on my request recently. He has recommended a PICC line and IV vancomycin and oral ciprofloxacin for 4 weeks. She will be reviewed by him periodically. He also received several notes from Dr. Sonia Baller to help as documented all the requisites for hyperbaric oxygen therapy. 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 He also had a coronary angioplasty with stent placement in October 2013. Prior to that her 2-D echo showed EF of 35%. Her latest 2-D echo done on 11/01/2014 showed a LV EF of 50-55%. Systolic function and wall motion were within normal limits. Chest x-ray done in January 16, 2015 showed no acute findings. Last hemoglobin A1c done on 12/30/2014 was 6.7. CBC done on the same day was within normal limits, except for a hemoglobin being 8.7 and a hematocrit between 29%. She had arterial duplex study done in August 2015 which showed: No evidence of segmental lower extremity arterial disease at rest, bilaterally. Normal ABI's, bilaterally. Normal great toe pressures, bilaterally. With all the above in place I believe she is a very good candidate for hyperbaric oxygen therapy and I would recommend this with the usual protocol for 6 weeks. she is going to get her PICC line this Friday and the IV antibiotics and when to start. She is also starting on oral ciprofloxacin. 03/03/2015 - recently hospitalized for anemia. No source identified. Started on amiodarone, contraindication to HBO. Reports left heel feels better. Main complaint is new buttocks ulcers. No fever or chills.  Minimal drainage. 03/10/2015 -- There is a new ulceration noticed yesterday on her right second toe at the tip. She also has noticed some ulcerations on her gluteal region and we will need to take a look at this. As far as her medications go she does not know for sure what her dosage of amiodarone is exactly. She will look it up and let me know today. 03/17/2015 -- as far as her dosage of amiodarone she was taking : 02/25/15 - 03/02/15 400 mg per day; 03/03/15 - 03/10/15 200 mg per day. She stopped taking the medication on 03/10/15. She has not yet done the checks x-ray and her PFTs are still pending. Recent Lab work done on 03/13/2015 shows a glucose is 124, K+ was 3.3, her albumen was 2.9, WBC count was 3.4, hemoglobin was 9.3, hematocrit was 30.4 and platelets were 240. C-reactive protein was 33.7 which was high and the Vancomycin trough was 18.6 which was high. 03/24/2015 -- he saw several doctors since I saw her last and they include her medical oncologist, cardiologist, neurologist and infectious disease doctor. Her PICC line is out and she now is on oral Cipro and doxycycline as per her verbal report. She has completed her pulmonary function test yesterday and a [name], [name]. ([id number]) recent chest x-ray is within normal limits. If the PFTs within normal limits she will be able to restart the hyperbaric oxygen therapy. 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  Underlying interstitial lung disease to be considered. Further recommendations were that of a CT chest and pulmonary consult if indicated. 05/11/2015 -- due to some discomfort in her ears during hyperbaric oxygen therapy, she saw ENT yesterday who basically said everything was good and continue with hyperbaric therapy. She has minimal redness of her right second toe but she is still on  ciprofloxacin. 05/18/2015 -- her nose is almost completely healed and so is the sacral area. She still has an open wound on the right second toe which needs further care. He has just completed a course of ciprofloxacin. 06/08/2015 -- she developed a blister on her right third toe on the dorsum and this has opened out and has had skin breakdown. 06/15/2015 -- When the dressing was taken down today, there was a redness noted of the second toe on the right side and mild cellulitis of the forefoot. No pus discharge or fever. 06/22/2015 --X-ray of the right foot: IMPRESSION: Findings compatible with osteomyelitis of the tuft of the distal phalanx of the second toe. There is overlying soft tissue swelling. She is unable to have her MRI and hence I will get a 3-phase bone scan to confirm this. She will also need to see Dr. Ola Spurr soon. If osteomyelitis is confirmed she will benefit from additional hyperbaric oxygen therapy. She was recently admitted to the hospital and on 06/18/2015 she was taken up for surgery by Dr. Samara Deist. Preop diagnosis: Osteomyelitis distal right second toe. Postop diagnosis: Same Procedure: Amputation right second toe at [facility] He was discharged home on doxycycline and ciprofloxacin and will follow-up with Dr. Vickki Muff. She called our office and we have asked her to come and see as appropriately. Pathology DIAGNOSIS: A. RIGHT SECOND TOE; AMPUTATION: - SKIN AND SOFT TISSUE WITH ULCERATION AND ABSCESS. - UNDERLYING BONE WITH OSTEOMYELITIS. -THE BONY AND SOFT TISSUE MARGINS ARE VIABLE AND UNINVOLVED. The patient is here today with no problems and intact dressing on her right foot. She has concerns though about her gluteal area and the natal cleft and has come to get this reviewed. Objective Constitutional Pulse regular. Respirations normal and unlabored. Afebrile. Vitals Time Taken: 8:55 AM, Height: 68 in, Weight: 257 lbs, BMI: 39.1, Temperature: 97.9 F, Pulse:  88 [name], Vickie S. ([id number]) bpm, Respiratory Rate: 18 breaths/min, Blood Pressure: 129/60 mmHg. Eyes Nonicteric. Reactive to light. Ears, Nose, Mouth, and Throat Lips, teeth, and gums WNL.Marland Kitchen Moist mucosa without lesions . Neck supple and nontender. No palpable supraclavicular or cervical adenopathy. Normal sized without goiter. Respiratory WNL. No retractions.. Cardiovascular Pedal Pulses WNL. No clubbing, cyanosis or edema. Lymphatic No adneopathy. No adenopathy. No adenopathy. Musculoskeletal Adexa without tenderness or enlargement.. Digits and nails w/o clubbing, cyanosis, infection, petechiae, ischemia, or inflammatory conditions.Marland Kitchen Psychiatric Judgement and insight Intact.. No evidence of depression, anxiety, or agitation.. General Notes: She has a intact dressing on her right foot and this has not been opened because it is postsurgical. The gluteal area has good skin closure and a mild dimpling of skin in the center of the natal cleft and this is not purulent drainage or any discharge. Integumentary (Hair, Skin) No suspicious lesions. No crepitus or fluctuance. No peri-wound warmth or erythema. No masses.. 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 ([id number]) L03.115 - Cellulitis of right lower limb M86.671 - Other chronic osteomyelitis, right ankle and foot Since the osteomyelitis has been treated with a partial amputation of her right second toe, at this stage I will defer management to her podiatrist who has put her on appropriate antibiotics and  is treating her wound. She has no other open wounds which need regular care at the wound center. She has been urged to follow-up with her PCP and her podiatrist and if any wound care issues arise she is more than welcome to come back to see [place] Plan Follow-up Appointments: Wound #7 Right Toe Second: Return Appointment in 1 month Wound #8 Right,Dorsal Toe Third: Return Appointment in 1 month Additional Orders / Instructions: Wound #7 Right Toe Second: Other: - TO be followed by Dr. Vickki Muff. Patient had amputation of 3rd toe. Surgical dressing still in place. Wound #8 Right,Dorsal Toe Third: Other: - TO be followed by Dr. Vickki Muff. Patient had amputation of 3rd toe. Surgical dressing still in place. Since the osteomyelitis has been treated with a partial amputation of her right second toe, at this stage I will defer management to her podiatrist who has put her on appropriate antibiotics and is treating her wound. She has no other open wounds which need regular care at the wound center. She has been urged to follow-up with her PCP and her podiatrist and if any wound care issues arise she is more than welcome to come back to see [place] Electronic Signature(s) Signed: 06/22/2015 9:28:33 AM By: Christin Fudge MD, FACS Entered By: Christin Fudge on 06/22/2015 09:28:33 [name] ([id number] [name] ([id number]) -------------------------------------------------------------------------------- [facility] Details Patient Name: [name] Date of Service: 06/22/2015 Medical Record Number: [id number] Patient Account Number: [id number] Date of Birth/Sex: 30-Jan-1956 (59 y.o. 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, left ankle and foot L97.512 Non-pressure chronic ulcer of other part of right foot with fat layer exposed L97.511 Non-pressure chronic ulcer of other part of right foot limited to breakdown of skin L03.115 Cellulitis of right lower limb Facility Procedures CPT4 Code: [id number] Description: [id number] - WOUND CARE VISIT-LEV 1 EST PT Modifier: Quantity: 1 Physician Procedures CPT4: Description Modifier Quantity Code [id number] - WC PHYS LEVEL 3 - EST PT 1 ICD-10 Description Diagnosis E11.621 Type 2 diabetes mellitus with foot ulcer E11.42 Type 2 diabetes mellitus with diabetic polyneuropathy L03.115 Cellulitis of right  lower limb L97.511 Non-pressure chronic ulcer of other part of right foot limited to breakdown of skin Electronic Signature(s) Signed: 06/22/2015 9:29:04 AM By: Christin Fudge MD, FACS Previous Signature: 06/22/2015 9:09:28 AM Version By: Regan Lemming BSN, RN Entered By: Christin Fudge on 06/22/2015 09:29:04

## 2015-06-23 NOTE — Anesthesia Postprocedure Evaluation (Signed)
  Anesthesia Post-op Note  Patient: [name]  Procedure(s) Performed: Procedure(s): AMPUTATION TOE (Right)  Anesthesia type:MAC  Patient location: PACU  Post pain: Pain level controlled  Post assessment: Post-op Vital signs reviewed, Patient's Cardiovascular Status Stable, Respiratory Function Stable, Patent Airway and No signs of Nausea or vomiting  Post vital signs: Reviewed and stable  Last Vitals:  Filed Vitals:   06/19/15 0735  BP:   Pulse:   Temp: 36.6 C  Resp:     Level of consciousness: awake, alert  and patient cooperative  Complications: No apparent anesthesia complications

## 2015-06-26 ENCOUNTER — Ambulatory Visit (INDEPENDENT_AMBULATORY_CARE_PROVIDER_SITE_OTHER): Payer: PPO | Admitting: Family Medicine

## 2015-06-26 ENCOUNTER — Encounter: Payer: Self-pay | Admitting: Family Medicine

## 2015-06-26 ENCOUNTER — Ambulatory Visit (INDEPENDENT_AMBULATORY_CARE_PROVIDER_SITE_OTHER): Payer: PPO

## 2015-06-26 ENCOUNTER — Ambulatory Visit: Payer: PPO | Admitting: Physician Assistant

## 2015-06-26 VITALS — BP 128/76 | HR 91 | Temp 98.1°F | Resp 16 | Wt 220.0 lb

## 2015-06-26 DIAGNOSIS — E1142 Type 2 diabetes mellitus with diabetic polyneuropathy: Secondary | ICD-10-CM | POA: Diagnosis not present

## 2015-06-26 DIAGNOSIS — I2510 Atherosclerotic heart disease of native coronary artery without angina pectoris: Secondary | ICD-10-CM

## 2015-06-26 DIAGNOSIS — N289 Disorder of kidney and ureter, unspecified: Secondary | ICD-10-CM

## 2015-06-26 DIAGNOSIS — E785 Hyperlipidemia, unspecified: Secondary | ICD-10-CM | POA: Diagnosis not present

## 2015-06-26 DIAGNOSIS — D509 Iron deficiency anemia, unspecified: Secondary | ICD-10-CM

## 2015-06-26 DIAGNOSIS — R29898 Other symptoms and signs involving the musculoskeletal system: Secondary | ICD-10-CM

## 2015-06-26 DIAGNOSIS — M86271 Subacute osteomyelitis, right ankle and foot: Secondary | ICD-10-CM | POA: Diagnosis not present

## 2015-06-26 DIAGNOSIS — R2231 Localized swelling, mass and lump, right upper limb: Secondary | ICD-10-CM

## 2015-06-26 DIAGNOSIS — Z22322 Carrier or suspected carrier of Methicillin resistant Staphylococcus aureus: Secondary | ICD-10-CM

## 2015-06-26 LAB — CBC WITH DIFFERENTIAL/PLATELET
BASOS PCT: 0 % (ref 0–1)
Basophils Absolute: 0 10*3/uL (ref 0.0–0.1)
EOS ABS: 0.1 10*3/uL (ref 0.0–0.7)
Eosinophils Relative: 1 % (ref 0–5)
HCT: 37.2 % (ref 36.0–46.0)
Hemoglobin: 12.3 g/dL (ref 12.0–15.0)
Lymphocytes Relative: 33 % (ref 12–46)
Lymphs Abs: 2.2 10*3/uL (ref 0.7–4.0)
MCH: 26.4 pg (ref 26.0–34.0)
MCHC: 33.1 g/dL (ref 30.0–36.0)
MCV: 79.8 fL (ref 78.0–100.0)
MONO ABS: 0.4 10*3/uL (ref 0.1–1.0)
MONOS PCT: 6 % (ref 3–12)
MPV: 9.4 fL (ref 8.6–12.4)
NEUTROS ABS: 4 10*3/uL (ref 1.7–7.7)
Neutrophils Relative %: 60 % (ref 43–77)
PLATELETS: 249 10*3/uL (ref 150–400)
RBC: 4.66 MIL/uL (ref 3.87–5.11)
RDW: 17.2 % — AB (ref 11.5–15.5)
WBC: 6.7 10*3/uL (ref 4.0–10.5)

## 2015-06-26 LAB — LIPID PANEL
Cholesterol: 166 mg/dL (ref 125–200)
HDL: 37 mg/dL — ABNORMAL LOW (ref >=46)
LDL CALC: 104 mg/dL (ref <130)
TRIGLYCERIDES: 125 mg/dL (ref <150)
Total CHOL/HDL Ratio: 4.5 Ratio (ref <=5.0)
VLDL: 25 mg/dL (ref <30)

## 2015-06-26 LAB — COMPREHENSIVE METABOLIC PANEL
ALT: 12 U/L (ref 6–29)
AST: 18 U/L (ref 10–35)
Albumin: 4 g/dL (ref 3.6–5.1)
Alkaline Phosphatase: 104 U/L (ref 33–130)
BILIRUBIN TOTAL: 0.4 mg/dL (ref 0.2–1.2)
BUN: 37 mg/dL — AB (ref 7–25)
CHLORIDE: 99 mmol/L (ref 98–110)
CO2: 30 mmol/L (ref 20–31)
CREATININE: 1.16 mg/dL — AB (ref 0.50–0.99)
Calcium: 9.9 mg/dL (ref 8.6–10.4)
GLUCOSE: 170 mg/dL — AB (ref 65–99)
Potassium: 5 mmol/L (ref 3.5–5.3)
SODIUM: 138 mmol/L (ref 135–146)
Total Protein: 7.6 g/dL (ref 6.1–8.1)

## 2015-06-26 NOTE — Progress Notes (Signed)
Subjective:    Patient ID: [name], female    DOB: [birth year], 59 y.o.   MRN: [id number]  06/26/2015  hospital follow up; Osteomyelitis ; and Diabetes   HPI This 59 y.o. female presents for [place] for hospital follow-up for R toe osteomyelitis s/p second digit amputation by Dr. Vickki Muff of podiatry. Here is the discharge summary:   DATE OF ADMISSION: 9/9/2016ADMITTING PHYSICIAN: Harrie Foreman, MD  DATE OF DISCHARGE: 06/19/2015 1:58 PM  PRIMARY CARE PHYSICIAN: Vickie,KRISTI, MD    ADMISSION DIAGNOSIS:  Osteomyelitis [M86.9] Cellulitis of right lower extremity [F79.024] DISCHARGE DIAGNOSIS:  Active Problems:  Osteomyelitis  SECONDARY DIAGNOSIS:   Past Medical History  Diagnosis Date  . Obesity, unspecified   . Unspecified vitamin D deficiency   . Pure hypercholesterolemia   . Neuropathy 2011  . Chicken pox   . Chronic systolic CHF (congestive heart failure)     a. mixed ICM & NICM; b. EF 20-25% by echo 07/2012, mid-dist 2/3 of LV sev HK/AK, mild MR. echo 10/2012: EF 30-35%, sev HK ant-septal & inf walls, GR1DD, mild MR, PASP 33. c. echo 02/2013: EF 30%, GR1DD, mild MR. echo 04/2014: EF 30%, Septal-lat dyssynchrony, global HK, inf AK, GR1DD, mild MR. d. echo 10/2014: EF50-55%, WM nl, GR1DD, septal mild paradox. e. echo 02/2015: EF 50-55%, wm   . Hypertension   . Anemia   . Automatic implantable cardioverter-defibrillator in situ     a. MDT CRT-D 06/2014, SN: [id number]   . Type II diabetes mellitus   . CAD (coronary artery disease)     a. cardiac cath 101/04/2012: PCI/DES to chronically occluded mLAD, consideration PCI to diag branch in 4 weeks.   Marland Kitchen History of blood transfusion ~ 2011    "plasma; had neuropathy; couldn't walk"  . OSA on CPAP     Moderate with AHI 23/hr and now on CPAP at 16cm H2O. Her DME is [facility]  . LBBB (left bundle branch block)    HOSPITAL COURSE:    59 year old Caucasian female admitted for osteomyelitis and surrounding cellulitis of her second right toe. Please see Dr. Tinnie Gens dictated history and physical for further details. She was started on broad-spectrum IV antibiotics. Podiatry consultation was obtained with Dr. Vickki Muff who recommended amputation of right second toe which was performed on September 11 as proximal to the proximal interphalangeal joint.   She was doing better and was discharged on oral doxycycline and Cipro for outpatient antibiotics. No dressing changes are needed. Dressing will be kept clean dry and intact. Plan to have her follow-up with Dr. Vickki Muff this week. She is agreeable with the discharge plan and is discharged home in stable condition. DISCHARGE CONDITIONS:  stable CONSULTS OBTAINED:  Treatment Team: Samara Deist, DPM Adrian Prows, MD DRUG ALLERGIES:  No Known Allergies DISCHARGE MEDICATIONS:   Discharge Medication List as of 06/19/2015 1:44 PM    START taking these medications   Details  ciprofloxacin (CIPRO) 500 MG tablet Take 1 tablet (500 mg total) by mouth 2 (two) times daily., Starting 06/19/2015, Until Discontinued, Normal      CONTINUE these medications which have CHANGED   Details  doxycycline (DORYX) 100 MG EC tablet Take 1 tablet (100 mg total) by mouth 2 (two) times daily., Starting 06/19/2015, Until Discontinued, Normal      CONTINUE these medications which have NOT CHANGED   Details  aspirin EC 81 MG tablet Take 1 tablet (81 mg total) by mouth daily., Starting 03/31/2014, Until Discontinued, Normal  gabapentin (NEURONTIN) 600 MG tablet Take 1 tablet (600 mg total) by mouth 3 (three) times daily., Starting 03/31/2014, Until Discontinued, Normal    Insulin Glargine (LANTUS) 100 UNIT/ML Solostar Pen Inject 10 Units into the skin daily at 10 pm., Starting 04/27/2015, Until Discontinued, Normal    isosorbide mononitrate (IMDUR) 60 MG 24 hr  tablet Take 1 tablet (60 mg total) by mouth daily., Starting 12/12/2014, Until Discontinued, Normal    nortriptyline (PAMELOR) 50 MG capsule TAKE 1 CAPSULE BY MOUTH EVERY NIGHT AT BEDTIME, Normal    Omega-3 Fatty Acids (FISH OIL) 1000 MG CAPS Take 100 mg by mouth 2 (two) times daily., Until Discontinued, Historical Med    pravastatin (PRAVACHOL) 40 MG tablet Take 1 tablet (40 mg total) by mouth at bedtime., Starting 03/31/2014, Until Discontinued, Normal    sitaGLIPtin (JANUVIA) 100 MG tablet Take 1 tablet (100 mg total) by mouth daily., Starting 01/10/2015, Until Discontinued, Normal    carvedilol (COREG) 6.25 MG tablet Take 4 tablets (25 mg total) by mouth 2 (two) times daily with a meal., Starting 05/02/2015, Until Discontinued, Normal    nitroGLYCERIN (NITROSTAT) 0.4 MG SL tablet Place 1 tablet (0.4 mg total) under the tongue every 5 (five) minutes as needed for chest pain., Starting 07/26/2012, Until Discontinued, Normal    nystatin (MYCOSTATIN) 100000 UNIT/ML suspension Take 5 mLs (500,000 Units total) by mouth 4 (four) times daily., Starting 04/27/2015, Until Discontinued, Normal    torsemide (DEMADEX) 20 MG tablet Take 2 tablets (40 mg total) by mouth daily., Starting 12/19/2014, Until Discontinued, Normal       DISCHARGE INSTRUCTIONS:   DIET:  Cardiac diet DISCHARGE CONDITION:  Good ACTIVITY:  Activity as tolerated OXYGEN:  Home Oxygen: No.  Oxygen Delivery: room air DISCHARGE LOCATION:  home   If you experience worsening of your admission symptoms, develop shortness of breath, life threatening emergency, suicidal or homicidal thoughts you must seek medical attention immediately by calling 911 or calling your MD immediately if symptoms less severe.  VITAL SIGNS:        Osteomyelitis:  S/p toe amputation; wound culture + MRSA.  Had been undergoing hyperbaric oxygen therapy for L heel osteomyelitis. Then, R second toe developed an ulcer  in middle July; went on vacation in July; unable to get in a pool or ocean; became infected.  Improved temporarily and then acutely worsened with cellulitis.  Went to [employer] one week prior to admission; foot had really swollen and redness had worsened.  [place] who advised pt to go to ED.  ED physician recommended admission; consultation by Dr. Vickki Muff of podiatry; recommended wound care center with hyperbaric therapy versus amputation.  Pt took amputation option.  Follow up with podiatry last week.  Antibiotic/Doxycycline and Cipro daily. Vancomycin during admission; mouth is broken out again.  Has Nystatin mouthwash remaining.  Cleared medically from [place].  Follow up with Vickki Muff in two weeks.  No consultation with Ola Spurr during admission.  No follow up with Wound Center regarding L heel osteomyelitis; finished hyperbaric treatment two weeks ago.  No follow-up xray or MRI of heel.    R elbow knot:  Developed one week ago; non-tender; no trauma.  No redness.  No painful ROM.  No n/t/w.   Mouth sores:  Recurrent.  Developed in May 2016 with vancomycin therapy for L heel osteomyelitis.  Received Vancomycin during recent admission; now has recurrent mouth sores.   DMII: sugars running elevated; recent HgbA1c of 8.2 during admission.  Fasting 171, 123, 168, 278, 237, 200, 224, 204.  Had to keep sugars elevated for hyperbaric clinic; would not give treatment if sugar was not > 170. Lantus 13 units qhs for two weeks.   Taking Januvia 172m 1/2 daily.   No Metformin.  No Glyburide.    Renal insufficiency: discharge creatinine of 1.06.  S/p nephrology consultation; follow-up 08/04/15; no changes to therapy; [facility]  Wants to add blood pressure medication.    Anemia: Hgb 10.6 on 06/16/15.  Taking ferrous sulfate 3258mbid; s/p iron infusions x 2 in the past.  S/p EGD and colonoscopy this year for acute decrease in H/H.  Polyneuropathy:  S/p follow-up with  neurology on 06/15/2015.  Has antibody in blood; ANA and P-ANCA and MPO antibodies +.  S/p evaluation by Dr. KeJefm Bryantf rheumatology in 02/2015.  Referred back to Dr. FiOla Spurrunderwent testing; then referred back to rheumatology.  No one has found anything; pt has not been advised of anything.  No follow-up with rheumatology; [facility]  S/p spinal tap.  Concerned about L finger flexion contracture.  Having balance issues since hospital discharge; s/p PT consultation after admission in 02/2015; has home exercises to perform to build up strength again; not performing exercises daily; does not feel needs formal PT; will start daily home exercises.    Review of Systems  Constitutional: Negative for fever, chills, diaphoresis and fatigue.  Eyes: Negative for visual disturbance.  Respiratory: Negative for cough and shortness of breath.   Cardiovascular: Negative for chest pain, palpitations and leg swelling.  Gastrointestinal: Negative for nausea, vomiting, abdominal pain, diarrhea and constipation.  Endocrine: Negative for cold intolerance, heat intolerance, polydipsia, polyphagia and polyuria.  Musculoskeletal: Positive for gait problem.  Skin: Positive for wound.  Neurological: Positive for weakness and numbness. Negative for dizziness, tremors, seizures, syncope, facial asymmetry, speech difficulty, light-headedness and headaches.  Psychiatric/Behavioral: Negative for dysphoric mood. The patient is not nervous/anxious.     Past Medical History  Diagnosis Date  . Obesity, unspecified   . Unspecified vitamin D deficiency   . Pure hypercholesterolemia   . [place]urop[place] ([facility]  . Chicken pox   . Chronic systolic CHF (congestive heart failure) (HCC)     a. mixed ICM & NICM; b. EF 20-25% by echo 07/2012, mid-dist 2/3 of LV sev HK/AK, mild MR. echo 10/2012: EF 30-35%, sev HK ant-septal & inf walls, GR1DD, mild MR, PASP 33. c. echo 02/2013: EF 30%, GR1DD,  mild MR. echo 04/2014: EF 30%, Septal-lat dyssynchrony, global HK, inf AK, GR1DD, mild MR. d. echo 10/2014: EF50-55%, WM nl, GR1DD, septal mild paradox. e. echo 02/2015: EF 50-55%, wm   . Hypertension   . Anemia   . Automatic implantable cardioverter-defibrillator in situ     a. MDT CRT-D 06/2014, SN: : [id number]    . [place] mellitus [place]  . CAD (coronary artery disease)     a. cardiac cath 101/04/2012: PCI/DES to chronically occluded mLAD, consideration PCI to diag branch in 4 weeks.   . Marland Kitchenistory of blood transfusion ~ 2011    "plasma; had neuropathy; couldn't walk"  . OSA on CPAP     Moderate with AHI 23/hr and now on CPAP at 16cm H2O.  Her DME is [facility]  . LBBB (left bundle branch block)    Past Surgical History  Procedure Laterality Date  . Lumbar disc surgery  1996    L4-5  . Incision  and drainage abscess Right 2007    groin; with ICU stay due to sepsis.  . Bilateral oophorectomy  01/2011    ovarian cyst benign  . Bi-ventricular implantable cardioverter defibrillator  (crt-d)  07/06/2014  . Laparoscopic cholecystectomy  2011  . Back surgery    . Coronary angioplasty with stent placement Left 07/2012    new onset systolic CHF; elevated troponins.  Cardiac catheterization with stenting to LAD; EF 15%.  2D-echo: EF 20-25%.  . Vaginal hysterectomy  01/2011    Fibroids/DUB.  Ovaries removed. [place].  . Tubal ligation  1981  . Left heart catheterization with coronary angiogram N/A 07/21/2012    Procedure: LEFT HEART CATHETERIZATION WITH CORONARY ANGIOGRAM;  Surgeon: Jolaine Artist, MD;  Location: [facility] CATH LAB;  Service: Cardiovascular;  Laterality: N/A;  . Percutaneous coronary stent intervention (pci-s) N/A 07/23/2012    Procedure: PERCUTANEOUS CORONARY STENT INTERVENTION (PCI-S);  Surgeon: Sherren Mocha, MD;  Location: [facility] CATH LAB;  Service: Cardiovascular;  Laterality: N/A;  . Bi-ventricular implantable cardioverter defibrillator N/A 07/06/2014    Procedure: BI-VENTRICULAR  IMPLANTABLE CARDIOVERTER DEFIBRILLATOR  (CRT-D);  Surgeon: Deboraha Sprang, MD;  Location: [facility] CATH LAB;  Service: Cardiovascular;  Laterality: N/A;  . Colonoscopy with propofol Left 02/22/2015    Procedure: COLONOSCOPY WITH PROPOFOL;  Surgeon: Hulen Luster, MD;  Location: [facility] ENDOSCOPY;  Service: Endoscopy;  Laterality: Left;  . Esophagogastroduodenoscopy N/A 02/22/2015    Procedure: ESOPHAGOGASTRODUODENOSCOPY (EGD);  Surgeon: Hulen Luster, MD;  Location: [name] ENDOSCOPY;  Service: Endoscopy;  Laterality: N/A;  . Peripheral vascular catheterization N/A 02/10/2015    Procedure: Picc Line Insertion;  Surgeon: Katha Cabal, MD;  Location: [facility] CV LAB;  Service: Cardiovascular;  Laterality: N/A;  . Amputation toe Right 06/18/2015    Procedure: AMPUTATION TOE;  Surgeon: Samara Deist, DPM;  Location: [facility] ORS;  Service: Podiatry;  Laterality: Right;   No Known Allergies Current Outpatient Prescriptions  Medication Sig Dispense Refill  . aspirin EC 81 MG tablet Take 1 tablet (81 mg total) by mouth daily. 90 tablet 3  . gabapentin (NEURONTIN) 600 MG tablet Take 1 tablet (600 mg total) by mouth 3 (three) times daily. 270 tablet 3  . Insulin Glargine (LANTUS) 100 UNIT/ML Solostar Pen Inject 10 Units into the skin daily at 10 pm. 15 mL 11  . nitroGLYCERIN (NITROSTAT) 0.4 MG SL tablet Place 1 tablet (0.4 mg total) under the tongue every 5 (five) minutes as needed for chest pain. 30 tablet 0  . nortriptyline (PAMELOR) 50 MG capsule TAKE 1 CAPSULE BY MOUTH EVERY NIGHT AT BEDTIME 30 capsule 2  . Omega-3 Fatty Acids (FISH OIL) 1000 MG CAPS Take 100 mg by mouth 2 (two) times daily.    . pravastatin (PRAVACHOL) 40 MG tablet Take 1 tablet (40 mg total) by mouth at bedtime. 90 tablet 3  . carvedilol (COREG) 3.125 MG tablet Take 1 tablet (3.125 mg total) by mouth 2 (two) times daily. 60 tablet 6  . isosorbide mononitrate (IMDUR) 30 MG 24 hr tablet Take 1 tablet (30 mg total) by mouth daily. 30 tablet 6  .  traMADol (ULTRAM) 50 MG tablet Take by mouth every 6 (six) hours as needed.     No current facility-administered medications for this visit.   Social History   Social History  . Marital Status: Married    Spouse Name: N/A  . Number of Children: 2  . Years of Education: 12   Occupational History  . disabled  03/2010 for peripheral neuropathy  . home daycare     x 20 yrs.   Social History Main Topics  . Smoking status: Never Smoker   . Smokeless tobacco: Never Used  . Alcohol Use: No  . Drug Use: No  . Sexual Activity: No   Other Topics Concern  . Not on file   Social History Narrative   Marital status:Married x 42 yrs. Happily married, no abuse.       Children:  2 children (daughter 85, son 13). One grandson and 2 step grandchildren. Daughter and grandson live in home with pt and spouse.       Lives: with husband.      Employment: disability for peripheral neuropathy 2012.  Previously had home daycare.      Tobacco: none      Alcohol: none      Drugs: none      Exercise: none.  [employer].      Pets: dog.      Always uses seat belts, smoke detectors in home.      No guns in the home.       Caffeine use: 2 cups coffee per day.       Nutrition: Well balanced diet.   Family History  Problem Relation Age of Onset  . Diabetes Mother   . Hypertension Mother   . Arthritis Mother     knees, lumbar DDD, cervical DDD  . Cancer Father     prostate,skin,lymphoma.  . Obesity Brother   . Diabetes Maternal Grandmother   . Diabetes Paternal Grandmother        Objective:    BP 128/76 mmHg  Pulse 91  Temp(Src) 98.1 F (36.7 C) (Oral)  Resp 16  Wt 220 lb (99.791 kg)  SpO2 97% Physical Exam  Constitutional: She is oriented to person, place, and time. She appears well-developed and well-nourished. No distress.  HENT:  Head: Normocephalic and atraumatic.  Right Ear: External ear normal.  Left Ear: External ear normal.  Nose: Nose normal.  Mouth/Throat:  Oropharynx is clear and moist.  Eyes: Conjunctivae and EOM are normal. Pupils are equal, round, and reactive to light.  Neck: Normal range of motion. Neck supple. Carotid bruit is not present. No thyromegaly present.  Cardiovascular: Normal rate, regular rhythm, normal heart sounds and intact distal pulses.  Exam reveals no gallop and no friction rub.   No murmur heard. Pulmonary/Chest: Effort normal and breath sounds normal. She has no wheezes. She has no rales.  Abdominal: Soft. Bowel sounds are normal. She exhibits no distension and no mass. There is no tenderness. There is no rebound and no guarding.  Lymphadenopathy:    She has no cervical adenopathy.  Neurological: She is alert and oriented to person, place, and time. No cranial nerve deficit.  Skin: Skin is warm and dry. No rash noted. She is not diaphoretic. No erythema. No pallor.  Well healing amputation site on R foot.    Psychiatric: She has a normal mood and affect. Her behavior is normal.   Results for orders placed or performed in visit on 06/26/15  CBC with Differential/Platelet  Result Value Ref Range   WBC 6.7 4.0 - 10.5 K/uL   RBC 4.66 3.87 - 5.11 MIL/uL   Hemoglobin 12.3 12.0 - 15.0 g/dL   HCT 37.2 36.0 - 46.0 %   MCV 79.8 78.0 - 100.0 fL   MCH 26.4 26.0 - 34.0 pg   MCHC 33.1 30.0 - 36.0  g/dL   RDW 17.2 (H) 11.5 - 15.5 %   Platelets 249 150 - 400 K/uL   MPV 9.4 8.6 - 12.4 fL   Neutrophils Relative % 60 43 - 77 %   Neutro Abs 4.0 1.7 - 7.7 K/uL   Lymphocytes Relative 33 12 - 46 %   Lymphs Abs 2.2 0.7 - 4.0 K/uL   Monocytes Relative 6 3 - 12 %   Monocytes Absolute 0.4 0.1 - 1.0 K/uL   Eosinophils Relative 1 0 - 5 %   Eosinophils Absolute 0.1 0.0 - 0.7 K/uL   Basophils Relative 0 0 - 1 %   Basophils Absolute 0.0 0.0 - 0.1 K/uL   Smear Review Criteria for review not met   Comprehensive metabolic panel  Result Value Ref Range   Sodium 138 135 - 146 mmol/L   Potassium 5.0 3.5 - 5.3 mmol/L   Chloride 99 98 - 110  mmol/L   CO2 30 20 - 31 mmol/L   Glucose, Bld 170 (H) 65 - 99 mg/dL   BUN 37 (H) 7 - 25 mg/dL   Creat 1.16 (H) 0.50 - 0.99 mg/dL   Total Bilirubin 0.4 0.2 - 1.2 mg/dL   Alkaline Phosphatase 104 33 - 130 U/L   AST 18 10 - 35 U/L   ALT 12 6 - 29 U/L   Total Protein 7.6 6.1 - 8.1 g/dL   Albumin 4.0 3.6 - 5.1 g/dL   Calcium 9.9 8.6 - 10.4 mg/dL  Lipid panel  Result Value Ref Range   Cholesterol 166 125 - 200 mg/dL   Triglycerides 125 <150 mg/dL   HDL 37 (L) >=46 mg/dL   Total CHOL/HDL Ratio 4.5 <=5.0 Ratio   VLDL 25 <30 mg/dL   LDL Cholesterol 104 <130 mg/dL   [facility] reading (PRIMARY) by  Dr. Tamala Julian.  R ELBOW:  No bony lesions; +subcutaneous mass.      Assessment & Plan:   1. Subacute osteomyelitis of right foot ([place])   2. Type 2 diabetes mellitus with diabetic polyneuropathy   3. Elbow mass, right   4. Anemia, iron deficiency   5. Hyperlipemia   6. Renal insufficiency   7. Coronary artery disease involving native coronary artery of native heart without angina pectoris   8. Muscular deconditioning     1.  Osteomyelitis R toe s/p amputation:  Stable; s/p admission with amputation by Vickki Muff; healing well.  Completing Cipro and Doxycycline therapy; s/p follow-up with Vickki Muff last week. Also recommend follow-up with wound center regarding L heel ulcer to confirm that repeat MRI not warranted. 2.  DMII with polyneuropathy: uncontrolled; increase Lantus to 17 units qhs. Finish current rx for Januvia; decrease to 1/2 tablet daily and then stop.  Restart Glimeperide 1 pill twice daily.  Advised patient to follow-up with rheumatology regarding neuropathy with +ANA and had contracture.   3.  R elbow mass: New. Stable; xray; monitor and observe for now; if rapidly enlarges, refer to dermatology or surgery for resection. 4.  Anemia iron deficiency: stable; continue ferrous sulfate bid. 5.  Hyperlipidemia: moderately controlled; labs reviewed; no changes to therapy. 6.  Renal insufficiency:  stable; followed by nephrology. 7.  CAD: stable; asymptomatic. 8.  Muscular deconditioning: New/recurrent with recent hospitalization.  Pt desires to restart home exercise program provided by PT with last admission.  Declined formal PT at this time.   Orders Placed This Encounter  Procedures  . DG Elbow Complete Right    Standing Status: Future  Number of Occurrences: 1     Standing Expiration Date: 06/25/2016    Order Specific Question:  Reason for Exam (SYMPTOM  OR DIAGNOSIS REQUIRED)    Answer:  R elbow bony growth    Order Specific Question:  Preferred imaging location?    Answer:  External  . CBC with Differential/Platelet  . Comprehensive metabolic panel  . Lipid panel   No orders of the defined types were placed in this encounter.    Return in about 3 months (around 09/25/2015) for recheck.   [name], M.D. [facility] [address] [place], [place]  [address] [phone] phone [phone] fax

## 2015-06-26 NOTE — Patient Instructions (Signed)
1.  FINISH JANUVIA 100MG  1/2 TABLET DAILY AND THEN STOP. 2.  INCREASE LANTUS TO 17 UNITS AT NIGHT. 3.  RESTART GLIMEPERIDE 1 PILL TWICE DAILY WITH FOOD. 4.  FOLLOW UP WITH DR. Jefm Bryant OF RHEUMATOLOGY. 5.  FOLLOW UP WITH THE WOUND CENTER TO DISCUSS FURTHER WORK UP OF LEFT HEEL.

## 2015-07-03 ENCOUNTER — Ambulatory Visit: Payer: PPO | Admitting: Neurology

## 2015-07-20 ENCOUNTER — Encounter: Payer: PPO | Attending: Surgery | Admitting: Surgery

## 2015-07-20 DIAGNOSIS — I10 Essential (primary) hypertension: Secondary | ICD-10-CM | POA: Diagnosis not present

## 2015-07-20 DIAGNOSIS — L02611 Cutaneous abscess of right foot: Secondary | ICD-10-CM | POA: Insufficient documentation

## 2015-07-20 DIAGNOSIS — L89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, stage 3: Secondary | ICD-10-CM | POA: Diagnosis not present

## 2015-07-20 DIAGNOSIS — L97512 Non-pressure chronic ulcer of other part of right foot with fat layer exposed: Secondary | ICD-10-CM | POA: Diagnosis not present

## 2015-07-20 DIAGNOSIS — E1142 Type 2 diabetes mellitus with diabetic polyneuropathy: Secondary | ICD-10-CM | POA: Diagnosis not present

## 2015-07-20 DIAGNOSIS — D649 Anemia, unspecified: Secondary | ICD-10-CM | POA: Insufficient documentation

## 2015-07-20 DIAGNOSIS — E785 Hyperlipidemia, unspecified: Secondary | ICD-10-CM | POA: Insufficient documentation

## 2015-07-20 DIAGNOSIS — L97422 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 layer exposed: Secondary | ICD-10-CM | POA: Diagnosis not present

## 2015-07-20 DIAGNOSIS — M869 Osteomyelitis, unspecified: Secondary | ICD-10-CM | POA: Insufficient documentation

## 2015-07-20 DIAGNOSIS — I5022 Chronic systolic (congestive) heart failure: Secondary | ICD-10-CM | POA: Insufficient documentation

## 2015-07-20 DIAGNOSIS — E11621 Type 2 diabetes mellitus with foot ulcer: Secondary | ICD-10-CM | POA: Diagnosis not present

## 2015-07-20 DIAGNOSIS — L97522 Non-pressure chronic ulcer of other part of left foot with fat layer exposed: Secondary | ICD-10-CM | POA: Diagnosis not present

## 2015-07-20 DIAGNOSIS — L89323 Pressure ulcer of left buttock, stage 3: Secondary | ICD-10-CM | POA: Diagnosis not present

## 2015-07-20 DIAGNOSIS — N289 Disorder of kidney and ureter, unspecified: Secondary | ICD-10-CM | POA: Diagnosis not present

## 2015-07-20 DIAGNOSIS — S0033XA Contusion of nose, initial encounter: Secondary | ICD-10-CM | POA: Diagnosis not present

## 2015-07-21 NOTE — Progress Notes (Signed)
LYNLEE, TROM ([id number]) Visit Report for 07/20/2015 Chief Complaint Document Details Patient Name: [name], [name] Date of Service: 07/20/2015 1:00 PM Medical Record Number: [id number] Patient Account Number: [id number] Date of Birth/Sex: 1956/07/13 (59 y.o. Female) Treating RN: Montey Hora Primary Care Physician: [name] Other Clinician: Referring Physician: [name] Treating Physician/Extender: Frann Rider in Treatment: 80 Information Obtained from: Patient Chief Complaint Patient presents to the wound care center for a consult due non healing wound 59 year old patient who comes with a history of an ulcer on the left second toe for at least about 4 months and a history on her left heel for about 3 weeks. I needed to get an MRI of her left foot but due to her defibrillator this could not be done and hence she is getting get a triple phase bone scan. 01/17/2015 -- she has developed a significant injury to the bridge of the nose where her CPAP machine was fitting. This is a painfull dark spot. Electronic Signature(s) Signed: 07/20/2015 1:36:33 PM By: Christin Fudge MD, FACS Entered By: Christin Fudge on 07/20/2015 13:36:33 [name] ([id number]) -------------------------------------------------------------------------------- HPI Details Patient Name: [name] Date of Service: 07/20/2015 1:00 PM Medical Record Number: [id number] Patient Account Number: [id number] Date of Birth/Sex: 1955/12/30 (59 y.o. Female) Treating RN: Montey Hora Primary Care Physician: [name] Other Clinician: Referring Physician: [name] Treating Physician/Extender: Frann Rider in Treatment: 28 History of Present Illness HPI Description: 59 year old patient who is known to have diabetes for several years and generally has it under good control last hemoglobin A1c was 6.7 done 2 weeks ago. She has several other comorbidities including  acute CHF, hypoxia, hyperlipidemia, systolic heart failure, myocardial infarction, coronary artery disease, iron deficiency anemia, renal insufficiency, essential hypertension, morbid obesity and obstructive sleep apnea. 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 In the past she's had a coronary angioplasty with stent in 2013, laparoscopic cholecystectomy in 2011, bilateral oophorectomy in April 2012 and she's had some lumbar disc surgery in 1996. She says she's had a lot of peripheral neuropathy and due to this she's had a ulcer on her left second toe for a while. She had a dry eschar on her left heel and recently this was removed and she found that she had some infection and some drainage from there. She has minimal pain around the heel but no pain on any areas of her toes. she has not been on any antibiotics recently and has known vascular problems in the recent past. 01/17/2015 left foot x-ray done on April 5 reveals there are destructive changes noted of the distal aspect of the proximal phalanx of the left second digit with extension into the adjacent proximal interphalangeal joint space. These findings suggest osteomyelitis with possible septic arthritis. I was able to make a call to Dr. Nevada Crane the radiologist, and had a detailed discussion with him regarding this x-ray. He noted that there were also some changes in the fifth phalanx on the left foot and this could have been chronic osteomyelitis too. The second toe on the left leg shows findings which cannot differentiate between an acute or chronic osteomyelitis and hence he has recommended a MRI with and without contrast. We will get this organized for her and in the meanwhile treat her with oral antibiotics which is susceptible to Bactrim. She was seen yesterday by her PCP Dr. Chaya Jan who put her on 3 antibiotics which include Levaquin and doxycycline and  Bactrim. 01/24/2015 She  is scheduled for a triple PHASE bone scan on 01/26/2015. She is still on antibiotics and otherwise doing well. 01/31/2015 Bone Imaging 3 phase study -- IMPRESSION:1. There is increased uptake on all 3 phases localizing to the left hindfoot region. This may be a manifestation of osteomyelitis. Correlation with exact site of nonhealing ulcer. 2. There is increased uptake localizing to the second phalanx of the left foot on the blood pool and delayed phase images. 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 Underlying infection not excluded. I have personally discussed the x-ray studies with the radiologist Dr. Kerby Moors and after comparing all her films he agrees that the left second phalanx does not have any definite underlying infection and this is CHERILEE, MENARD. ([id number]) now off a chronic arthropathy. There is a increased uptake in all 3 phases localized to the left hindfoot region and this is a manifestation of osteomyelitis. 02/07/2015 -- She was seen by the ID specialist Dr. Ola Spurr who reviewed her on my request recently. He has recommended a PICC line and IV vancomycin and oral ciprofloxacin for 4 weeks. She will be reviewed by him periodically. He also received several notes from Dr. Sonia Baller to help as documented all the requisites for hyperbaric oxygen therapy. 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 He also had a coronary angioplasty with stent placement in October 2013. Prior to that her 2-D echo showed EF of 35%. Her latest 2-D echo done on 11/01/2014 showed a LV EF of 50-55%. Systolic function and wall motion were within normal limits. Chest x-ray done in January 16, 2015 showed no acute findings. Last hemoglobin A1c done on 12/30/2014 was 6.7. CBC done on the same day was within normal limits, except for a hemoglobin being 8.7 and a hematocrit  between 29%. She had arterial duplex study done in August 2015 which showed: No evidence of segmental lower extremity arterial disease at rest, bilaterally. Normal ABI's, bilaterally. Normal great toe pressures, bilaterally. With all the above in place I believe she is a very good candidate for hyperbaric oxygen therapy and I would recommend this with the usual protocol for 6 weeks. she is going to get her PICC line this Friday and the IV antibiotics and when to start. She is also starting on oral ciprofloxacin. 03/03/2015 - recently hospitalized for anemia. No source identified. Started on amiodarone, contraindication to HBO. Reports left heel feels better. Main complaint is new buttocks ulcers. No fever or chills. Minimal drainage. 03/10/2015 -- There is a new ulceration noticed yesterday on her right second toe at the tip. She also has noticed some ulcerations on her gluteal region and we will need to take a look at this. As far as her medications go she does not know for sure what her dosage of amiodarone is exactly. She will look it up and let me know today. 03/17/2015 -- as far as her dosage of amiodarone she was taking : 02/25/15 - 03/02/15 400 mg per day; 03/03/15 - 03/10/15 200 mg per day. She stopped taking the medication on 03/10/15. She has not yet done the checks x-ray and her PFTs are still pending. Recent Lab work done on 03/13/2015 shows a glucose is 124, K+ was 3.3, her albumen was 2.9, WBC count was 3.4, hemoglobin was 9.3, hematocrit was 30.4 and platelets were 240. C-reactive protein was 33.7 which was high and the Vancomycin  trough was 18.6 which was high. 03/24/2015 -- he saw several doctors since I saw her last and they include her medical oncologist, cardiologist, neurologist and infectious disease doctor. Her PICC line is out and she now is on oral Cipro and doxycycline as per her verbal report. She has completed her pulmonary function test yesterday and a recent chest x-ray  is within normal limits. If the PFTs within normal limits she will be able to restart the hyperbaric oxygen therapy. 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 Underlying interstitial lung disease to be considered. Further recommendations were that of a CT chest and pulmonary consult if indicated. 05/11/2015 -- due to some discomfort in her ears during hyperbaric oxygen therapy, she saw ENT yesterday who basically said everything was good and continue with hyperbaric therapy. She has minimal redness of her right second toe but she is still on ciprofloxacin. MERYN, STYER ([id number]) 05/18/2015 -- her nose is almost completely healed and so is the sacral area. She still has an open wound on the right second toe which needs further care. He has just completed a course of ciprofloxacin. 06/08/2015 -- she developed a blister on her right third toe on the dorsum and this has opened out and has had skin breakdown. 06/15/2015 -- When the dressing was taken down today, there was a redness noted of the second toe on the right side and mild cellulitis of the forefoot. No pus discharge or fever. 06/22/2015 --X-ray of the right foot: IMPRESSION: Findings compatible with osteomyelitis of the tuft of the distal phalanx of the second toe. There is overlying soft tissue swelling. She is unable to have her MRI and hence I will get a 3-phase bone scan to confirm this. She will also need to see Dr. Ola Spurr soon. If osteomyelitis is confirmed she will benefit from additional hyperbaric oxygen therapy. She was recently admitted to the hospital and on 06/18/2015 she was taken up for surgery by Dr. Samara Deist. Preop diagnosis: Osteomyelitis distal right second toe. Postop diagnosis: Same Procedure: Amputation right second toe at [facility] He was discharged home on doxycycline and  ciprofloxacin and will follow-up with Dr. Vickki Muff. She called our office and we have asked her to come and see as appropriately. Pathology DIAGNOSIS: A. RIGHT SECOND TOE; AMPUTATION: - SKIN AND SOFT TISSUE WITH ULCERATION AND ABSCESS. - UNDERLYING BONE WITH OSTEOMYELITIS. -THE BONY AND SOFT TISSUE MARGINS ARE VIABLE AND UNINVOLVED. The patient is here today with no problems and intact dressing on her right foot. She has concerns though about her gluteal area and the natal cleft and has come to get this reviewed. 07/20/2015 -- she was here today to review her left heel where she's been having some pressure symptoms and wanted to confirm that there was no open ulceration. She has been doing fine otherwise and her right second toe has healed well after her partial amputation. Electronic Signature(s) Signed: 07/20/2015 1:37:22 PM By: Christin Fudge MD, FACS Entered By: Christin Fudge on 07/20/2015 13:37:22 [name] ([id number]) -------------------------------------------------------------------------------- Physical Exam Details Patient Name: [name] Date of Service: 07/20/2015 1:00 PM Medical Record Number: [id number] Patient Account Number: [id number] Date of Birth/Sex: [birth year] (59 y.o. Female) Treating RN: Montey Hora Primary Care Physician: [name] Other Clinician: Referring Physician: [name] Treating Physician/Extender: Frann Rider in Treatment: 27 Constitutional . Pulse regular. Respirations normal and unlabored. Afebrile. . Eyes Nonicteric.  Reactive to light. Ears, Nose, Mouth, and Throat Lips, teeth, and gums WNL.Marland Kitchen Moist mucosa without lesions . Neck supple and nontender. No palpable supraclavicular or cervical adenopathy. Normal sized without goiter. Respiratory WNL. No retractions.. Cardiovascular Pedal Pulses WNL. No clubbing, cyanosis or edema. Lymphatic No adneopathy. No adenopathy. No adenopathy. Musculoskeletal Adexa  without tenderness or enlargement.. Digits and nails w/o clubbing, cyanosis, infection, petechiae, ischemia, or inflammatory conditions.. Integumentary (Hair, Skin) No suspicious lesions. No crepitus or fluctuance. No peri-wound warmth or erythema. No masses.Marland Kitchen Psychiatric Judgement and insight Intact.. No evidence of depression, anxiety, or agitation.. Notes the right second toe wound is healed nicely and there is no open ulceration. The left heel shows no evidence of pressure injury and there is supple skin. Electronic Signature(s) Signed: 07/20/2015 1:38:02 PM By: Christin Fudge MD, FACS Entered By: Christin Fudge on 07/20/2015 13:38:02 [name] ([id number]) -------------------------------------------------------------------------------- Physician Orders Details Patient Name: [name] Date of Service: 07/20/2015 1:00 PM Medical Record Number: [id number] Patient Account Number: [id number] Date of Birth/Sex: 01-17-1956 (59 y.o. Female) Treating RN: Montey Hora Primary Care Physician: [name] Other Clinician: Referring Physician: [name] Treating Physician/Extender: Frann Rider in Treatment: 42 Verbal / Phone Orders: Yes Clinician: Montey Hora Read Back and Verified: Yes Diagnosis Coding Follow-up Appointments o Other: - as needed Additional Orders / Instructions o Increase protein intake. Electronic Signature(s) Signed: 07/20/2015 3:40:39 PM By: Christin Fudge MD, FACS Signed: 07/20/2015 4:23:35 PM By: Montey Hora Entered By: Montey Hora on 07/20/2015 13:32:02 [name] ([id number]) -------------------------------------------------------------------------------- Problem List Details Patient Name: [name] Date of Service: 07/20/2015 1:00 PM Medical Record Number: [id number] Patient Account Number: [id number] Date of Birth/Sex: 25-Jan-1956 (59 y.o. Female) Treating RN: Montey Hora Primary Care Physician:  [name] Other Clinician: Referring Physician: [name] Treating Physician/Extender: Frann Rider in Treatment: 43 Active Problems ICD-10 Encounter Code Description Active Date Diagnosis E11.621 Type 2 diabetes mellitus with foot ulcer 01/10/2015 Yes [id number] Chronic systolic (congestive) heart failure 01/10/2015 Yes E11.42 Type 2 diabetes mellitus with diabetic polyneuropathy 01/10/2015 Yes 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, left ankle and foot 02/13/2015 Yes L97.512 Non-pressure chronic ulcer of other part of right foot with 03/10/2015 Yes fat layer exposed L97.511 Non-pressure chronic ulcer of other part of right foot 06/08/2015 Yes limited to breakdown of skin L03.115 Cellulitis of right lower limb 06/15/2015 Yes M86.671 Other chronic osteomyelitis, right ankle and foot 06/22/2015 Yes Inactive Problems Resolved Problems ICD-10 Code Description Active Date Resolved Date L97.422 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 01/10/2015 01/10/2015 layer exposed LYNDON, FISS ([id number]) [phone] Non-pressure chronic ulcer of other part of left foot with fat 01/10/2015 01/10/2015 layer exposed S00.33XA Contusion of nose, initial encounter 01/17/2015 01/17/2015 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, stage 3 03/03/2015 03/03/2015 L89.323 Pressure ulcer of left buttock, stage 3 03/03/2015 03/03/2015 L02.611 Cutaneous abscess of right foot 04/20/2015 04/20/2015 Electronic Signature(s) Signed: 07/20/2015 1:36:27 PM By: Christin Fudge MD, FACS Entered By: Christin Fudge on 07/20/2015 13:36:26 [name] ([id number]) -------------------------------------------------------------------------------- Progress Note Details Patient Name: [name] Date of Service: 07/20/2015 1:00 PM Medical Record Number: [id number] Patient Account Number: [id number] Date of Birth/Sex: 12-10-1955 (59 y.o. Female) Treating RN: Montey Hora Primary Care Physician: [name] Other Clinician: Referring Physician: [name] Treating Physician/Extender: Frann Rider in Treatment: 43 Subjective Chief Complaint Information obtained from Patient Patient presents to the wound care center for a consult due non healing wound 59 year old patient who comes with a  history of an ulcer on the left second toe for at least about 4 months and a history on her left heel for about 3 weeks. I needed to get an MRI of her left foot but due to her defibrillator this could not be done and hence she is getting get a triple phase bone scan. 01/17/2015 -- she has developed a significant injury to the bridge of the nose where her CPAP machine was fitting. This is a painfull dark spot. History of Present Illness (HPI) 59 year old patient who is known to have diabetes for several years and generally has it under good control last hemoglobin A1c was 6.7 done 2 weeks ago. She has several other comorbidities including acute CHF, hypoxia, hyperlipidemia, systolic heart failure, myocardial infarction, coronary artery disease, iron deficiency anemia, renal insufficiency, essential hypertension, morbid obesity and obstructive sleep apnea. 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 In the past she's had a coronary angioplasty with stent in 2013, laparoscopic cholecystectomy in 2011, bilateral oophorectomy in April 2012 and she's had some lumbar disc surgery in 1996. She says she's had a lot of peripheral neuropathy and due to this she's had a ulcer on her left second toe for a while. She had a dry eschar on her left heel and recently this was removed and she found that she had some infection and some drainage from there. She has minimal pain around the heel but no pain on any areas of her toes. she has not been on any antibiotics recently and has known vascular problems in the recent past. 01/17/2015 left foot x-ray done on April 5  reveals there are destructive changes noted of the distal aspect of the proximal phalanx of the left second digit with extension into the adjacent proximal interphalangeal joint space. These findings suggest osteomyelitis with possible septic arthritis. I was able to make a call to Dr. Nevada Crane the radiologist, and had a detailed discussion with him regarding this x-ray. He noted that there were also some changes in the fifth phalanx on the left foot and this could have been chronic osteomyelitis too. The second toe on the left leg shows findings which cannot differentiate between an acute or chronic osteomyelitis and hence he has recommended a MRI with and without contrast. We will get this organized for her and in the meanwhile treat her with oral antibiotics which is susceptible to Bactrim. She was seen yesterday by her PCP Dr. Chaya Jan who put her on 3 antibiotics which include Levaquin and doxycycline and Bactrim. 01/24/2015 She is scheduled for a triple PHASE bone scan on 01/26/2015. She is still on antibiotics and otherwise doing well. DARIEL, TEIXEIRA ([id number]) 01/31/2015 Bone Imaging 3 phase study -- IMPRESSION:1. There is increased uptake on all 3 phases localizing to the left hindfoot region. This may be a manifestation of osteomyelitis. Correlation with exact site of nonhealing ulcer. 2. There is increased uptake localizing to the second phalanx of the left foot on the blood pool and delayed phase images. 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 Underlying infection not excluded. I have personally discussed the x-ray studies with the radiologist Dr. Kerby Moors and after comparing all her films he agrees that the left second phalanx does not have any definite underlying infection and this is now off a chronic arthropathy. There is a increased uptake in all 3 phases localized to the left hindfoot region and this is a  manifestation of osteomyelitis. 02/07/2015 -- She was seen by the ID specialist Dr. Ola Spurr who reviewed her on my request recently. He has recommended a PICC line and IV vancomycin and oral ciprofloxacin for 4 weeks. She will be reviewed by him periodically. He also received several notes from Dr. Sonia Baller to help as documented all the requisites for hyperbaric oxygen therapy. 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 He also had a coronary angioplasty with stent placement in October 2013. Prior to that her 2-D echo showed EF of 35%. Her latest 2-D echo done on 11/01/2014 showed a LV EF of 50-55%. Systolic function and wall motion were within normal limits. Chest x-ray done in January 16, 2015 showed no acute findings. Last hemoglobin A1c done on 12/30/2014 was 6.7. CBC done on the same day was within normal limits, except for a hemoglobin being 8.7 and a hematocrit between 29%. She had arterial duplex study done in August 2015 which showed: No evidence of segmental lower extremity arterial disease at rest, bilaterally. Normal ABI's, bilaterally. Normal great toe pressures, bilaterally. With all the above in place I believe she is a very good candidate for hyperbaric oxygen therapy and I would recommend this with the usual protocol for 6 weeks. she is going to get her PICC line this Friday and the IV antibiotics and when to start. She is also starting on oral ciprofloxacin. 03/03/2015 - recently hospitalized for anemia. No source identified. Started on amiodarone, contraindication to HBO. Reports left heel feels better. Main complaint is new buttocks ulcers. No fever or chills. Minimal drainage. 03/10/2015 -- There is a new ulceration noticed yesterday on her right second toe at the tip. She also has noticed some ulcerations on her gluteal region and we will need to take a look at this. As far as her medications  go she does not know for sure what her dosage of amiodarone is exactly. She will look it up and let me know today. 03/17/2015 -- as far as her dosage of amiodarone she was taking : 02/25/15 - 03/02/15 400 mg per day; 03/03/15 - 03/10/15 200 mg per day. She stopped taking the medication on 03/10/15. She has not yet done the checks x-ray and her PFTs are still pending. Recent Lab work done on 03/13/2015 shows a glucose is 124, K+ was 3.3, her albumen was 2.9, WBC count was 3.4, hemoglobin was 9.3, hematocrit was 30.4 and platelets were 240. C-reactive protein was 33.7 which was high and the Vancomycin trough was 18.6 which was high. 03/24/2015 -- he saw several doctors since I saw her last and they include her medical oncologist, cardiologist, neurologist and infectious disease doctor. Her PICC line is out and she now is on oral Cipro and doxycycline as per her verbal report. She has completed her pulmonary function test yesterday and a LATISHA, WELTON. ([id number]) recent chest x-ray is within normal limits. If the PFTs within normal limits she will be able to restart the hyperbaric oxygen therapy. 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 Underlying interstitial lung disease to be considered. Further recommendations were that of a CT chest and pulmonary consult if indicated. 05/11/2015 -- due to some discomfort in her ears during hyperbaric oxygen therapy, she saw ENT yesterday who basically said everything was good and continue with hyperbaric therapy. She has minimal redness of  her right second toe but she is still on ciprofloxacin. 05/18/2015 -- her nose is almost completely healed and so is the sacral area. She still has an open wound on the right second toe which needs further care. He has just completed a course of ciprofloxacin. 06/08/2015 -- she developed a blister on  her right third toe on the dorsum and this has opened out and has had skin breakdown. 06/15/2015 -- When the dressing was taken down today, there was a redness noted of the second toe on the right side and mild cellulitis of the forefoot. No pus discharge or fever. 06/22/2015 --X-ray of the right foot: IMPRESSION: Findings compatible with osteomyelitis of the tuft of the distal phalanx of the second toe. There is overlying soft tissue swelling. She is unable to have her MRI and hence I will get a 3-phase bone scan to confirm this. She will also need to see Dr. Ola Spurr soon. If osteomyelitis is confirmed she will benefit from additional hyperbaric oxygen therapy. She was recently admitted to the hospital and on 06/18/2015 she was taken up for surgery by Dr. Samara Deist. Preop diagnosis: Osteomyelitis distal right second toe. Postop diagnosis: Same Procedure: Amputation right second toe at [facility] He was discharged home on doxycycline and ciprofloxacin and will follow-up with Dr. Vickki Muff. She called our office and we have asked her to come and see as appropriately. Pathology DIAGNOSIS: A. RIGHT SECOND TOE; AMPUTATION: - SKIN AND SOFT TISSUE WITH ULCERATION AND ABSCESS. - UNDERLYING BONE WITH OSTEOMYELITIS. -THE BONY AND SOFT TISSUE MARGINS ARE VIABLE AND UNINVOLVED. The patient is here today with no problems and intact dressing on her right foot. She has concerns though about her gluteal area and the natal cleft and has come to get this reviewed. 07/20/2015 -- she was here today to review her left heel where she's been having some pressure symptoms and wanted to confirm that there was no open ulceration. She has been doing fine otherwise and her right second toe has healed well after her partial amputation. Objective AMORITA, JOHANSEN ([id number]) Constitutional Pulse regular. Respirations normal and unlabored. Afebrile. Vitals Time Taken: 1:12 PM, Height: 68 in, Weight: 257 lbs, BMI:  39.1, Temperature: 98.2 F, Pulse: 83 bpm, Respiratory Rate: 18 breaths/min, Blood Pressure: 156/71 mmHg. Eyes Nonicteric. Reactive to light. Ears, Nose, Mouth, and Throat Lips, teeth, and gums WNL.Marland Kitchen Moist mucosa without lesions . Neck supple and nontender. No palpable supraclavicular or cervical adenopathy. Normal sized without goiter. Respiratory WNL. No retractions.. Cardiovascular Pedal Pulses WNL. No clubbing, cyanosis or edema. Lymphatic No adneopathy. No adenopathy. No adenopathy. Musculoskeletal Adexa without tenderness or enlargement.. Digits and nails w/o clubbing, cyanosis, infection, petechiae, ischemia, or inflammatory conditions.Marland Kitchen Psychiatric Judgement and insight Intact.. No evidence of depression, anxiety, or agitation.. General Notes: the right second toe wound is healed nicely and there is no open ulceration. The left heel shows no evidence of pressure injury and there is supple skin. Integumentary (Hair, Skin) No suspicious lesions. No crepitus or fluctuance. No peri-wound warmth or erythema. No masses.. Wound #7 status is Healed - Epithelialized. Original cause of wound was Gradually Appeared. The wound is located on the Right Toe Second. The wound measures 0cm length x 0cm width x 0cm depth; 0cm^2 area and 0cm^3 volume. Wound #8 status is Healed - Epithelialized. Original cause of wound was Gradually Appeared. The wound is located on the Right,Dorsal Toe Third. The wound measures 0cm length x 0cm width x 0cm depth; 0cm^2 area and 0cm^3 volume.  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 I have reassured her that she does not have any open wounds or pressure injury to heal. I have recommended offloading as much as possible and discussed the various modalities to do this. We have also discussed nutrition, protein supplement and multivitamins with micronutrients. She does not need any ongoing wound care and can come back to see [place] as needed. Electronic Signature(s) Signed: 07/20/2015 1:38:59 PM By: Christin Fudge MD, FACS Entered By: Christin Fudge on 07/20/2015 13:38:59 [name] ([id number]) -------------------------------------------------------------------------------- SuperBill Details Patient Name: [name] Date of Service: 07/20/2015 Medical Record Number: [id number] Patient Account Number: [id number] Date of Birth/Sex: July 14, 1956 (59 y.o. 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42 Type 2 diabetes mellitus with diabetic polyneuropathy L97.511 Non-pressure chronic  ulcer of other part of right foot limited to breakdown of skin Electronic Signature(s) Signed: 07/20/2015 1:39:30 PM By: Christin Fudge MD, FACS Entered By: Christin Fudge on 07/20/2015 13:39:30

## 2015-07-21 NOTE — Progress Notes (Addendum)
Date of Birth/Sex: 03/17/1956 (59 y.o. Female) Treating RN: Montey Hora Primary Care Physician: [name] Other Clinician: Referring Physician: [name] Treating Physician/Extender: Frann Rider in Treatment: 54 Active Inactive Electronic Signature(s) Signed: 07/28/2015 10:29:04 AM By: Montey Hora Previous Signature: 07/20/2015 4:23:35 PM Version By: Montey Hora Entered By: Montey Hora on 07/28/2015 10:29:02 [name] ([id number]) -------------------------------------------------------------------------------- Patient/Caregiver Education Details Patient Name: [name] Date of Service: 07/20/2015 1:00 PM Medical Record Number: [id number] Patient Account Number: [id number] Date of Birth/Gender: [birth year] (59 y.o. Female) Treating RN: Montey Hora Primary Care Physician: [name] Other Clinician: Referring Physician: [name] Treating Physician/Extender: Frann Rider in Treatment: 34 Education Assessment Education Provided To: Patient Education Topics Provided Pressure: Handouts: Other: pressure reief measures  and offloading heels Methods: Demonstration, Explain/Verbal Responses: State content correctly Electronic Signature(s) Signed: 07/20/2015 4:23:35 PM By: Montey Hora Entered By: Montey Hora on 07/20/2015 13:38:29 [name] ([id number]) -------------------------------------------------------------------------------- Wound Assessment Details Patient Name: [name] Date of Service: 07/20/2015 1:00 PM Medical Record Number: [id number] Patient Account Number: [id number] Date of Birth/Sex: 1955-10-13 (59 y.o. Female) Treating RN: Montey Hora Primary Care Physician: [name] Other Clinician: Referring Physician: [name] Treating Physician/Extender: Frann Rider in Treatment: 27 Wound Status Wound Number: 7 Primary Diabetic Wound/Ulcer of the Lower Etiology: Extremity Wound Location: Right Toe Second Wound Status: Healed - Epithelialized Wounding Event: Gradually Appeared Date Acquired: 03/10/2015 Weeks Of Treatment: 18 Clustered Wound: No Photos Photo Uploaded By: Montey Hora on 07/20/2015 14:14:44 Wound Measurements Length: (cm) 0 % Reduction i Width: (cm) 0 % Reduction i Depth: (cm) 0 Area: (cm) 0 Volume: (cm) 0 n Area: 100% n Volume: 100% Wound Description Classification: Grade 1 Periwound Skin Texture Texture Color No Abnormalities Noted: No No Abnormalities Noted: No Moisture No Abnormalities Noted: No Electronic Signature(s) Signed: 07/20/2015 4:23:35 PM By: Casimer Lanius ([id number]) Entered By: Montey Hora on 07/20/2015 13:20:11 [name] ([id number]) -------------------------------------------------------------------------------- Wound Assessment Details Patient Name: [name] Date of Service: 07/20/2015 1:00 PM Medical Record Number: [id number] Patient Account Number: [id number] Date of Birth/Sex: 04-Aug-1956 (59 y.o. Female) Treating RN: Montey Hora Primary Care  Physician: [name] Other Clinician: Referring Physician: [name] Treating Physician/Extender: Frann Rider in Treatment: 27 Wound Status Wound Number: 8 Primary Diabetic Wound/Ulcer of the Lower Etiology: Extremity Wound Location: Right, Dorsal Toe Third Wound Status: Healed - Epithelialized Wounding Event: Gradually Appeared Date Acquired: 06/06/2015 Weeks Of Treatment: 6 Clustered Wound: No Photos Photo Uploaded By: Montey Hora on 07/20/2015 14:14:57 Wound Measurements Length: (cm) 0 % Reduction i Width: (cm) 0 % Reduction i Depth: (cm) 0 Area: (cm) 0 Volume: (cm) 0 n Area: 100% n Volume: 100% Wound Description Classification: Grade 1 Periwound Skin Texture Texture Color No Abnormalities Noted: No No Abnormalities Noted: No Moisture No Abnormalities Noted: No Electronic Signature(s) Signed: 07/20/2015 4:23:35 PM By: Casimer Lanius ([id number]) Entered By: Montey Hora on 07/20/2015 13:20:12 [name] ([id number]) -------------------------------------------------------------------------------- Vitals Details Patient Name: [name] Date of Service: 07/20/2015 1:00 PM Medical Record Number: [id number] Patient Account Number: [id number] Date of Birth/Sex: [birth year] (59 y.o. Female) Treating RN: Montey Hora Primary Care Physician: [name] Other Clinician: Referring Physician: [name] Treating Physician/Extender: Frann Rider in Treatment: 27 Vital Signs Time Taken: 13:12 Temperature (F): 98.2 Height (in): 68 Pulse (bpm): 83 Weight (lbs): 257 Respiratory Rate (breaths/min): 18 Body Mass Index (BMI): 39.1 Blood Pressure (mmHg): 156/71 Reference Range: 80 - 120 mg /  Date of Birth/Sex: 03/17/1956 (59 y.o. Female) Treating RN: Montey Hora Primary Care Physician: [name] Other Clinician: Referring Physician: [name] Treating Physician/Extender: Frann Rider in Treatment: 54 Active Inactive Electronic Signature(s) Signed: 07/28/2015 10:29:04 AM By: Montey Hora Previous Signature: 07/20/2015 4:23:35 PM Version By: Montey Hora Entered By: Montey Hora on 07/28/2015 10:29:02 [name] ([id number]) -------------------------------------------------------------------------------- Patient/Caregiver Education Details Patient Name: [name] Date of Service: 07/20/2015 1:00 PM Medical Record Number: [id number] Patient Account Number: [id number] Date of Birth/Gender: [birth year] (59 y.o. Female) Treating RN: Montey Hora Primary Care Physician: [name] Other Clinician: Referring Physician: [name] Treating Physician/Extender: Frann Rider in Treatment: 34 Education Assessment Education Provided To: Patient Education Topics Provided Pressure: Handouts: Other: pressure reief measures  and offloading heels Methods: Demonstration, Explain/Verbal Responses: State content correctly Electronic Signature(s) Signed: 07/20/2015 4:23:35 PM By: Montey Hora Entered By: Montey Hora on 07/20/2015 13:38:29 [name] ([id number]) -------------------------------------------------------------------------------- Wound Assessment Details Patient Name: [name] Date of Service: 07/20/2015 1:00 PM Medical Record Number: [id number] Patient Account Number: [id number] Date of Birth/Sex: 1955-10-13 (59 y.o. Female) Treating RN: Montey Hora Primary Care Physician: [name] Other Clinician: Referring Physician: [name] Treating Physician/Extender: Frann Rider in Treatment: 27 Wound Status Wound Number: 7 Primary Diabetic Wound/Ulcer of the Lower Etiology: Extremity Wound Location: Right Toe Second Wound Status: Healed - Epithelialized Wounding Event: Gradually Appeared Date Acquired: 03/10/2015 Weeks Of Treatment: 18 Clustered Wound: No Photos Photo Uploaded By: Montey Hora on 07/20/2015 14:14:44 Wound Measurements Length: (cm) 0 % Reduction i Width: (cm) 0 % Reduction i Depth: (cm) 0 Area: (cm) 0 Volume: (cm) 0 n Area: 100% n Volume: 100% Wound Description Classification: Grade 1 Periwound Skin Texture Texture Color No Abnormalities Noted: No No Abnormalities Noted: No Moisture No Abnormalities Noted: No Electronic Signature(s) Signed: 07/20/2015 4:23:35 PM By: Casimer Lanius ([id number]) Entered By: Montey Hora on 07/20/2015 13:20:11 [name] ([id number]) -------------------------------------------------------------------------------- Wound Assessment Details Patient Name: [name] Date of Service: 07/20/2015 1:00 PM Medical Record Number: [id number] Patient Account Number: [id number] Date of Birth/Sex: 04-Aug-1956 (59 y.o. Female) Treating RN: Montey Hora Primary Care  Physician: [name] Other Clinician: Referring Physician: [name] Treating Physician/Extender: Frann Rider in Treatment: 27 Wound Status Wound Number: 8 Primary Diabetic Wound/Ulcer of the Lower Etiology: Extremity Wound Location: Right, Dorsal Toe Third Wound Status: Healed - Epithelialized Wounding Event: Gradually Appeared Date Acquired: 06/06/2015 Weeks Of Treatment: 6 Clustered Wound: No Photos Photo Uploaded By: Montey Hora on 07/20/2015 14:14:57 Wound Measurements Length: (cm) 0 % Reduction i Width: (cm) 0 % Reduction i Depth: (cm) 0 Area: (cm) 0 Volume: (cm) 0 n Area: 100% n Volume: 100% Wound Description Classification: Grade 1 Periwound Skin Texture Texture Color No Abnormalities Noted: No No Abnormalities Noted: No Moisture No Abnormalities Noted: No Electronic Signature(s) Signed: 07/20/2015 4:23:35 PM By: Casimer Lanius ([id number]) Entered By: Montey Hora on 07/20/2015 13:20:12 [name] ([id number]) -------------------------------------------------------------------------------- Vitals Details Patient Name: [name] Date of Service: 07/20/2015 1:00 PM Medical Record Number: [id number] Patient Account Number: [id number] Date of Birth/Sex: [birth year] (59 y.o. Female) Treating RN: Montey Hora Primary Care Physician: [name] Other Clinician: Referring Physician: [name] Treating Physician/Extender: Frann Rider in Treatment: 27 Vital Signs Time Taken: 13:12 Temperature (F): 98.2 Height (in): 68 Pulse (bpm): 83 Weight (lbs): 257 Respiratory Rate (breaths/min): 18 Body Mass Index (BMI): 39.1 Blood Pressure (mmHg): 156/71 Reference Range: 80 - 120 mg /  [name], [name] ([id number]) Visit Report for 07/20/2015 Arrival Information Details Patient Name: [name], [name] Date of Service: 07/20/2015 1:00 PM Medical Record Number: [id number] Patient Account Number: [id number] Date of Birth/Sex: [birth year] (59 y.o. Female) Treating RN: Montey Hora Primary Care Physician: [name] Other Clinician: Referring Physician: [name] Treating Physician/Extender: Frann Rider in Treatment: 31 Visit Information History Since Last Visit Added or deleted any medications: No Patient Arrived: Ambulatory Any new allergies or adverse reactions: No Arrival Time: 13:11 Had a fall or experienced change in No Accompanied By: self activities of daily living that may affect Transfer Assistance: None risk of falls: Patient Identification Verified: Yes Signs or symptoms of abuse/neglect since last No Secondary Verification Process Yes visito Completed: Hospitalized since last visit: No Patient Requires Transmission- No Pain Present Now: No Based Precautions: Patient Has Alerts: Yes Patient Alerts: Patient on Blood Thinner ABI L:0.8, R:0.93 Electronic Signature(s) Signed: 07/20/2015 4:23:35 PM By: Montey Hora Entered By: Montey Hora on 07/20/2015 13:12:29 [name] ([id number]) -------------------------------------------------------------------------------- Clinic Level of Care Assessment Details Patient Name: [name] Date of Service: 07/20/2015 1:00 PM Medical Record Number: [id number] Patient Account Number: [id number] Date of Birth/Sex: Oct 07, 1956 (59 y.o. Female) Treating RN: Montey Hora Primary Care Physician: [name] Other Clinician: Referring Physician: [name] Treating Physician/Extender: Frann Rider in Treatment: 38 Clinic Level of Care Assessment Items TOOL 4 Quantity Score []  - Use when only an EandM is performed on FOLLOW-UP visit 0 ASSESSMENTS - Nursing  Assessment / Reassessment X - Reassessment of Co-morbidities (includes updates in patient status) 1 10 X - Reassessment of Adherence to Treatment Plan 1 5 ASSESSMENTS - Wound and Skin Assessment / Reassessment X - Simple Wound Assessment / Reassessment - one wound 1 5 []  - Complex Wound Assessment / Reassessment - multiple wounds 0 []  - Dermatologic / Skin Assessment (not related to wound area) 0 ASSESSMENTS - Focused Assessment []  - Circumferential Edema Measurements - multi extremities 0 []  - Nutritional Assessment / Counseling / Intervention 0 X - Lower Extremity Assessment (monofilament, tuning fork, pulses) 1 5 []  - Peripheral Arterial Disease Assessment (using hand held doppler) 0 ASSESSMENTS - Ostomy and/or Continence Assessment and Care []  - Incontinence Assessment and Management 0 []  - Ostomy Care Assessment and Management (repouching, etc.) 0 PROCESS - Coordination of Care X - Simple Patient / Family Education for ongoing care 1 15 []  - Complex (extensive) Patient / Family Education for ongoing care 0 []  - Staff obtains Programmer, systems, Records, Test Results / Process Orders 0 []  - Staff telephones HHA, Nursing Homes / Clarify orders / etc 0 []  - Routine Transfer to another Facility (non-emergent condition) 0 [name], [name] ([id number]) []  - Routine Hospital Admission (non-emergent condition) 0 []  - New Admissions / Biomedical engineer / Ordering NPWT, Apligraf, etc. 0 []  - Emergency Hospital Admission (emergent condition) 0 X - Simple Discharge Coordination 1 10 []  - Complex (extensive) Discharge Coordination 0 PROCESS - Special Needs []  - Pediatric / Minor Patient Management 0 []  - Isolation Patient Management 0 []  - Hearing / Language / Visual special needs 0 []  - Assessment of Community assistance (transportation, D/C planning, etc.) 0 []  - Additional assistance / Altered mentation 0 []  - Support Surface(s) Assessment (bed, cushion, seat, etc.) 0 INTERVENTIONS -  Wound Cleansing / Measurement []  - Simple Wound Cleansing - one wound 0 []  - Complex Wound Cleansing - multiple wounds 0 X - Wound Imaging (photographs - any number of wounds) 1 5 []  -  Date of Birth/Sex: 03/17/1956 (59 y.o. Female) Treating RN: Montey Hora Primary Care Physician: [name] Other Clinician: Referring Physician: [name] Treating Physician/Extender: Frann Rider in Treatment: 54 Active Inactive Electronic Signature(s) Signed: 07/28/2015 10:29:04 AM By: Montey Hora Previous Signature: 07/20/2015 4:23:35 PM Version By: Montey Hora Entered By: Montey Hora on 07/28/2015 10:29:02 [name] ([id number]) -------------------------------------------------------------------------------- Patient/Caregiver Education Details Patient Name: [name] Date of Service: 07/20/2015 1:00 PM Medical Record Number: [id number] Patient Account Number: [id number] Date of Birth/Gender: [birth year] (59 y.o. Female) Treating RN: Montey Hora Primary Care Physician: [name] Other Clinician: Referring Physician: [name] Treating Physician/Extender: Frann Rider in Treatment: 34 Education Assessment Education Provided To: Patient Education Topics Provided Pressure: Handouts: Other: pressure reief measures  and offloading heels Methods: Demonstration, Explain/Verbal Responses: State content correctly Electronic Signature(s) Signed: 07/20/2015 4:23:35 PM By: Montey Hora Entered By: Montey Hora on 07/20/2015 13:38:29 [name] ([id number]) -------------------------------------------------------------------------------- Wound Assessment Details Patient Name: [name] Date of Service: 07/20/2015 1:00 PM Medical Record Number: [id number] Patient Account Number: [id number] Date of Birth/Sex: 1955-10-13 (59 y.o. Female) Treating RN: Montey Hora Primary Care Physician: [name] Other Clinician: Referring Physician: [name] Treating Physician/Extender: Frann Rider in Treatment: 27 Wound Status Wound Number: 7 Primary Diabetic Wound/Ulcer of the Lower Etiology: Extremity Wound Location: Right Toe Second Wound Status: Healed - Epithelialized Wounding Event: Gradually Appeared Date Acquired: 03/10/2015 Weeks Of Treatment: 18 Clustered Wound: No Photos Photo Uploaded By: Montey Hora on 07/20/2015 14:14:44 Wound Measurements Length: (cm) 0 % Reduction i Width: (cm) 0 % Reduction i Depth: (cm) 0 Area: (cm) 0 Volume: (cm) 0 n Area: 100% n Volume: 100% Wound Description Classification: Grade 1 Periwound Skin Texture Texture Color No Abnormalities Noted: No No Abnormalities Noted: No Moisture No Abnormalities Noted: No Electronic Signature(s) Signed: 07/20/2015 4:23:35 PM By: Casimer Lanius ([id number]) Entered By: Montey Hora on 07/20/2015 13:20:11 [name] ([id number]) -------------------------------------------------------------------------------- Wound Assessment Details Patient Name: [name] Date of Service: 07/20/2015 1:00 PM Medical Record Number: [id number] Patient Account Number: [id number] Date of Birth/Sex: 04-Aug-1956 (59 y.o. Female) Treating RN: Montey Hora Primary Care  Physician: [name] Other Clinician: Referring Physician: [name] Treating Physician/Extender: Frann Rider in Treatment: 27 Wound Status Wound Number: 8 Primary Diabetic Wound/Ulcer of the Lower Etiology: Extremity Wound Location: Right, Dorsal Toe Third Wound Status: Healed - Epithelialized Wounding Event: Gradually Appeared Date Acquired: 06/06/2015 Weeks Of Treatment: 6 Clustered Wound: No Photos Photo Uploaded By: Montey Hora on 07/20/2015 14:14:57 Wound Measurements Length: (cm) 0 % Reduction i Width: (cm) 0 % Reduction i Depth: (cm) 0 Area: (cm) 0 Volume: (cm) 0 n Area: 100% n Volume: 100% Wound Description Classification: Grade 1 Periwound Skin Texture Texture Color No Abnormalities Noted: No No Abnormalities Noted: No Moisture No Abnormalities Noted: No Electronic Signature(s) Signed: 07/20/2015 4:23:35 PM By: Casimer Lanius ([id number]) Entered By: Montey Hora on 07/20/2015 13:20:12 [name] ([id number]) -------------------------------------------------------------------------------- Vitals Details Patient Name: [name] Date of Service: 07/20/2015 1:00 PM Medical Record Number: [id number] Patient Account Number: [id number] Date of Birth/Sex: [birth year] (59 y.o. Female) Treating RN: Montey Hora Primary Care Physician: [name] Other Clinician: Referring Physician: [name] Treating Physician/Extender: Frann Rider in Treatment: 27 Vital Signs Time Taken: 13:12 Temperature (F): 98.2 Height (in): 68 Pulse (bpm): 83 Weight (lbs): 257 Respiratory Rate (breaths/min): 18 Body Mass Index (BMI): 39.1 Blood Pressure (mmHg): 156/71 Reference Range: 80 - 120 mg /

## 2015-07-26 ENCOUNTER — Other Ambulatory Visit: Payer: Self-pay | Admitting: Family Medicine

## 2015-07-27 ENCOUNTER — Encounter: Payer: Self-pay | Admitting: Cardiovascular Disease

## 2015-07-27 ENCOUNTER — Ambulatory Visit (INDEPENDENT_AMBULATORY_CARE_PROVIDER_SITE_OTHER): Payer: PPO | Admitting: Cardiovascular Disease

## 2015-07-27 VITALS — BP 122/72 | HR 73 | Ht 68.0 in | Wt 233.0 lb

## 2015-07-27 DIAGNOSIS — I447 Left bundle-branch block, unspecified: Secondary | ICD-10-CM

## 2015-07-27 DIAGNOSIS — I2510 Atherosclerotic heart disease of native coronary artery without angina pectoris: Secondary | ICD-10-CM | POA: Diagnosis not present

## 2015-07-27 DIAGNOSIS — I509 Heart failure, unspecified: Secondary | ICD-10-CM

## 2015-07-27 MED ORDER — CARVEDILOL 3.125 MG PO TABS: 3.1250 mg | ORAL_TABLET | Freq: Two times a day (BID) | ORAL | Status: DC

## 2015-07-27 MED ORDER — ISOSORBIDE MONONITRATE ER 30 MG PO TB24: 30.0000 mg | ORAL_TABLET | Freq: Every day | ORAL | Status: DC

## 2015-07-27 NOTE — Progress Notes (Signed)
Patient ID: [name], female   DOB: 11/08/1955, 59 y.o.   MRN: [id number]    HPI:  [name] is a 59 y.o. female with past medical history of HL, morbid obesity, chronic pain and DM since she was 59 years old. She had neuropathy in 2011 causing her to be in a wheelchair, this recovered after 8 months of therapy.  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    Admitted to [facility] 10/13 with progressive dyspnea and lower extremity edema.  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  Underwent complex PTCA and stenting of LAD by Dr. Burt Knack.  She was placed on dual antiplatelets therapy.   She has been unable to tolerate spironolactone or lisinopril due to hyperkalemia.  She was hospitalized in May,2016 at [facility] for symptomatic anemia. Hemoglobin was 7.3. Dual antiplatelets therapy was stopped. No obvious source of bleeding was identified and currently she is receiving IV iron therapy with improvement in hemoglobin to 9.5. She had an echocardiogram during her hospitalization which showed an ejection fraction of 50-55% with mild mitral regurgitation. Peripheral arterial dopplers 8/15 with normal ABIs bilaterally  Sleep study 11/15 with severe OSA  Lexiscan Cardiolite in 3/16 with EF 50%, small apical infarct with mild peri-infarct ischemia, low risk study.   Medtronic CRT-D implantation 9/15.   Her anemia resolved after treatment. She had amputation of right second toe in September for infection. Most torsemide and carvedilol were stopped due to hypotension. She has been doing reasonably well with no worsening dyspnea. No chest pain.  ROS: All systems negative except as listed in HPI, PMH and Problem List.  Past Medical History  Diagnosis Date  . Obesity, unspecified   . Unspecified vitamin D deficiency   . Pure hypercholesterolemia   . Neuropathy  ([place]) 2011  . Chicken pox   . Chronic systolic CHF (congestive heart failure) (HCC)     a. mixed ICM & NICM; b. EF 20-25% by echo 07/2012, mid-dist 2/3 of LV sev HK/AK, mild MR. echo 10/2012: EF 30-35%, sev HK ant-septal & inf walls, GR1DD, mild MR, PASP 33. c. echo 02/2013: EF 30%, GR1DD, mild MR. echo 04/2014: EF 30%, Septal-lat dyssynchrony, global HK, inf AK, GR1DD, mild MR. d. echo 10/2014: EF50-55%, WM nl, GR1DD, septal mild paradox. e. echo 02/2015: EF 50-55%, wm   . Hypertension   . Anemia   . Automatic implantable cardioverter-defibrillator in situ     a. MDT CRT-D 06/2014, [id number]    . Type II diabetes mellitus ([place])   . CAD (coronary artery disease)     a. cardiac cath 101/04/2012: PCI/DES to chronically occluded mLAD, consideration PCI to diag branch in 4 weeks.   Marland Kitchen History of blood transfusion ~ 2011    "plasma; had neuropathy; couldn't walk"  . OSA on CPAP     Moderate with AHI 23/hr and now on CPAP at 16cm H2O.  Her DME is [facility]  . LBBB (left bundle branch block)     Current Outpatient Prescriptions  Medication Sig Dispense Refill  . aspirin EC 81 MG tablet Take 1 tablet (81 mg total) by mouth daily. 90 tablet 3  . gabapentin (NEURONTIN) 600 MG tablet Take 1 tablet (600 mg total) by mouth 3 (three) times daily. 270 tablet 3  . Insulin Glargine (LANTUS) 100 UNIT/ML Solostar Pen Inject 10 Units into the skin daily  at 10 pm. 15 mL 11  . isosorbide mononitrate (IMDUR) 60 MG 24 hr tablet Take 1 tablet (60 mg total) by mouth daily. 90 tablet 3  . nitroGLYCERIN (NITROSTAT) 0.4 MG SL tablet Place 1 tablet (0.4 mg total) under the tongue every 5 (five) minutes as needed for chest pain. 30 tablet 0  . nortriptyline (PAMELOR) 50 MG capsule TAKE 1 CAPSULE BY MOUTH EVERY NIGHT AT BEDTIME 30 capsule 2  . Omega-3 Fatty Acids (FISH OIL) 1000 MG CAPS Take 100 mg by mouth 2 (two) times daily.    . pravastatin (PRAVACHOL) 40 MG tablet Take 1 tablet (40 mg total) by mouth at bedtime. 90 tablet 3   . torsemide (DEMADEX) 20 MG tablet Take 2 tablets (40 mg total) by mouth daily. 60 tablet 3  . traMADol (ULTRAM) 50 MG tablet Take by mouth every 6 (six) hours as needed.     No current facility-administered medications for this visit.    Filed Vitals:   07/27/15 1528  Height: 5\' 8"  (1.727 m)  Weight: 233 lb (105.688 kg)   PHYSICAL EXAM: General: Obese, no resp difficulty. HEENT: normal  Neck: supple. JVP not elevated. Carotids 2+ bilat; no bruits. No lymphadenopathy or thryomegaly appreciated.  Cor: PMI nondisplaced. Regular rate & rhythm. No S3. Lungs: Slight crackles at bases.   Abdomen: obese, soft, nontender, nondistended. No hepatosplenomegaly. No bruits or masses. Good bowel sounds.  Extremities: no cyanosis, clubbing, rash.  Trace lower legs bilaterally.  Neuro: alert & orientedx3  EKG: Atrial sensed ventricular paced rhythm.  ASSESSMENT & PLAN:  1) Chronic systolic HF: Ischemic cardiomyopathy with EF 30% on 7/15 echo--most recent ejection fraction in May was 50-55% .   s/p Medtronic CRT-D.   NYHA class 3 symptoms . Symptoms improved after treating anemia - She has not been on ACEI or spironolactone due to severe hyperkalemia and CKD I resumed carvedilol at 3.125 mg twice daily.     2) CAD: S/p complex LAD PCI 07/2012. Lexiscan Cardiolite in 3/16 was low risk.  No recent chest pain.  - Continue statin and aspirin. I decreased Imdur 30 mg once daily.  3) Hyperlipidemia: On pravastatin.. Followed by PCP.     [name] 07/27/2015

## 2015-07-27 NOTE — Patient Instructions (Signed)
Medication Instructions:  Your physician has recommended you make the following change in your medication:  DECREASE imdur to 30mg  once per day START taking coreg 3.125mg  twice per day   Labwork: none  Testing/Procedures: none  Follow-Up: Your physician recommends that you schedule a follow-up appointment in: 4 months with Dr. Fletcher Anon.    Any Other Special Instructions Will Be Listed Below (If Applicable).

## 2015-07-31 NOTE — Telephone Encounter (Signed)
Please CALL IN REFILL OF TRAMADOL as approved since I am out of the office today.

## 2015-07-31 NOTE — Telephone Encounter (Signed)
Called in.

## 2015-08-02 ENCOUNTER — Ambulatory Visit: Payer: PPO | Admitting: Family Medicine

## 2015-08-04 ENCOUNTER — Encounter: Payer: Self-pay | Admitting: Family Medicine

## 2015-08-04 ENCOUNTER — Ambulatory Visit (INDEPENDENT_AMBULATORY_CARE_PROVIDER_SITE_OTHER): Payer: PPO | Admitting: Family Medicine

## 2015-08-04 VITALS — BP 126/69 | HR 73 | Temp 97.9°F | Resp 16 | Ht 69.0 in | Wt 233.2 lb

## 2015-08-04 DIAGNOSIS — N182 Chronic kidney disease, stage 2 (mild): Secondary | ICD-10-CM

## 2015-08-04 DIAGNOSIS — E785 Hyperlipidemia, unspecified: Secondary | ICD-10-CM | POA: Diagnosis not present

## 2015-08-04 DIAGNOSIS — N289 Disorder of kidney and ureter, unspecified: Secondary | ICD-10-CM | POA: Insufficient documentation

## 2015-08-04 DIAGNOSIS — I10 Essential (primary) hypertension: Secondary | ICD-10-CM

## 2015-08-04 DIAGNOSIS — Z1159 Encounter for screening for other viral diseases: Secondary | ICD-10-CM | POA: Diagnosis not present

## 2015-08-04 DIAGNOSIS — Z114 Encounter for screening for human immunodeficiency virus [HIV]: Secondary | ICD-10-CM

## 2015-08-04 DIAGNOSIS — E1142 Type 2 diabetes mellitus with diabetic polyneuropathy: Secondary | ICD-10-CM | POA: Diagnosis not present

## 2015-08-04 DIAGNOSIS — S50811A Abrasion of right forearm, initial encounter: Secondary | ICD-10-CM | POA: Diagnosis not present

## 2015-08-04 DIAGNOSIS — M86271 Subacute osteomyelitis, right ankle and foot: Secondary | ICD-10-CM | POA: Diagnosis not present

## 2015-08-04 DIAGNOSIS — N189 Chronic kidney disease, unspecified: Secondary | ICD-10-CM | POA: Insufficient documentation

## 2015-08-04 DIAGNOSIS — D509 Iron deficiency anemia, unspecified: Secondary | ICD-10-CM

## 2015-08-04 LAB — CBC WITH DIFFERENTIAL/PLATELET
BASOS ABS: 0 10*3/uL (ref 0.0–0.1)
BASOS PCT: 0 % (ref 0–1)
EOS ABS: 0.1 10*3/uL (ref 0.0–0.7)
Eosinophils Relative: 1 % (ref 0–5)
HCT: 34.7 % — ABNORMAL LOW (ref 36.0–46.0)
Hemoglobin: 11.7 g/dL — ABNORMAL LOW (ref 12.0–15.0)
Lymphocytes Relative: 30 % (ref 12–46)
Lymphs Abs: 1.6 10*3/uL (ref 0.7–4.0)
MCH: 28.3 pg (ref 26.0–34.0)
MCHC: 33.7 g/dL (ref 30.0–36.0)
MCV: 84 fL (ref 78.0–100.0)
MONOS PCT: 6 % (ref 3–12)
MPV: 9.3 fL (ref 8.6–12.4)
Monocytes Absolute: 0.3 10*3/uL (ref 0.1–1.0)
NEUTROS PCT: 63 % (ref 43–77)
Neutro Abs: 3.3 10*3/uL (ref 1.7–7.7)
PLATELETS: 178 10*3/uL (ref 150–400)
RBC: 4.13 MIL/uL (ref 3.87–5.11)
RDW: 16.6 % — ABNORMAL HIGH (ref 11.5–15.5)
WBC: 5.3 10*3/uL (ref 4.0–10.5)

## 2015-08-04 LAB — COMPREHENSIVE METABOLIC PANEL
ALT: 16 U/L (ref 6–29)
AST: 19 U/L (ref 10–35)
Albumin: 3.6 g/dL (ref 3.6–5.1)
Alkaline Phosphatase: 87 U/L (ref 33–130)
BUN: 26 mg/dL — AB (ref 7–25)
CHLORIDE: 104 mmol/L (ref 98–110)
CO2: 28 mmol/L (ref 20–31)
CREATININE: 1.03 mg/dL — AB (ref 0.50–0.99)
Calcium: 8.8 mg/dL (ref 8.6–10.4)
Glucose, Bld: 183 mg/dL — ABNORMAL HIGH (ref 65–99)
Potassium: 4.5 mmol/L (ref 3.5–5.3)
SODIUM: 140 mmol/L (ref 135–146)
TOTAL PROTEIN: 6.5 g/dL (ref 6.1–8.1)
Total Bilirubin: 0.4 mg/dL (ref 0.2–1.2)

## 2015-08-04 LAB — HIV ANTIBODY (ROUTINE TESTING W REFLEX): HIV: NONREACTIVE

## 2015-08-04 NOTE — Patient Instructions (Signed)
1. Decrease glimeperide to one tablet every morning and 1/2 tablet at bedtime.

## 2015-08-04 NOTE — Progress Notes (Signed)
Subjective:    Patient ID: [name], female    DOB: Mar 21, 1956, 59 y.o.   MRN: [id number]  08/04/2015  Follow-up and Medication Refill   HPI This 59 y.o. female presents for one month follow-up:   1. DMII with polyneuropathy:  Fasting sugars; adjusts insulin based on nighttime sugars running.  Lantus 12units to 17 units daily.  Finished Januvia.  Restarted Glyburide one tablet twice daily.  Fasting sugars ranging ; nighttime sugars ranging 200-255. Sometimes will forget second Glyburide.  Forgets Glyburide almost every night.    Bedtime is 12-1.  Eat supper from 6-8.  Not having many lows.    2.Hyperlipidemia: Patient reports good compliance with medication, good tolerance to medication, and good symptom control.    3.  CAD: carvedilol restarted last week by Dr. Kirke Corin.  3.125mg ; decreased Imdur to 30mg  daily.    4.  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 Ether Griffins of podiatry.  Follow up with [facility] two weeks ago; discharged from care.  Asked [facility] center physician about heel osteomyelitis; not sure but did not recommend repeat MRI.  No fever/chills/sweats.  Energy level is good.    5. R elbow mass: s/p xray at last visit.  6. Renal insufficiency; s/p nephrology consultation in 06/2015.  Scheduled to see nephrologist today.    7. Deconditioning: was to start home exercise program for strengthening.  No longer using walking.  No home exercises.  Drinking protein drinks which seems to be helping; started protein drinks two months ago.  [employer] 31mg  protein Premier.    8. OSA on CPAP: has not been wearing CPAP due to wound.  9. R forearm wound:  Fell last night; had socks on and tripped on edge of flooring; hit head and hit R forearm with skin abrasion with bleeding.  Daughter is getting married on Halloween.  Pt doing the food and the wedding cake.     Review of Systems  Constitutional: Negative for fever, chills, diaphoresis and fatigue.  Eyes:  Negative for visual disturbance.  Respiratory: Negative for cough and shortness of breath.   Cardiovascular: Negative for chest pain, palpitations and leg swelling.  Gastrointestinal: Negative for nausea, vomiting, abdominal pain, diarrhea and constipation.  Endocrine: Negative for cold intolerance, heat intolerance, polydipsia, polyphagia and polyuria.  Skin: Positive for color change and wound.  Neurological: Positive for numbness. Negative for dizziness, tremors, seizures, syncope, facial asymmetry, speech difficulty, weakness, light-headedness and headaches.    Past Medical History  Diagnosis Date  . Obesity, unspecified   . Unspecified vitamin D deficiency   . Pure hypercholesterolemia   . Neuropathy (HCC) 2011  . Chicken pox   . Chronic systolic CHF (congestive heart failure) (HCC)     a. mixed ICM & NICM; b. EF 20-25% by echo 07/2012, mid-dist 2/3 of LV sev HK/AK, mild MR. echo 10/2012: EF 30-35%, sev HK ant-septal & inf walls, GR1DD, mild MR, PASP 33. c. echo 02/2013: EF 30%, GR1DD, mild MR. echo 04/2014: EF 30%, Septal-lat dyssynchrony, global HK, inf AK, GR1DD, mild MR. d. echo 10/2014: EF50-55%, WM nl, GR1DD, septal mild paradox. e. echo 02/2015: EF 50-55%, wm   . Hypertension   . Anemia   . Automatic implantable cardioverter-defibrillator in situ     a. MDT CRT-D 06/2014, SN: [id number]    . Type II diabetes mellitus (HCC)   . CAD (coronary artery disease)     a. cardiac cath 101/04/2012: PCI/DES to chronically occluded mLAD, consideration  PCI to diag branch in 4 weeks.   Marland Kitchen History of blood transfusion ~ 2011    "plasma; had neuropathy; couldn't walk"  . OSA on CPAP     Moderate with AHI 23/hr and now on CPAP at 16cm H2O.  Her DME is [facility]  . LBBB (left bundle branch block)    Past Surgical History  Procedure Laterality Date  . Lumbar disc surgery  1996    L4-5  . Incision and drainage abscess Right 2007    groin; with ICU stay due to sepsis.  . Bilateral oophorectomy  01/2011     ovarian cyst benign  . Bi-ventricular implantable cardioverter defibrillator  (crt-d)  07/06/2014  . Laparoscopic cholecystectomy  2011  . Back surgery    . Coronary angioplasty with stent placement Left 07/2012    new onset systolic CHF; elevated troponins.  Cardiac catheterization with stenting to LAD; EF 15%.  2D-echo: EF 20-25%.  . Vaginal hysterectomy  01/2011    Fibroids/DUB.  Ovaries removed. [place].  . Tubal ligation  1981  . Left heart catheterization with coronary angiogram N/A 07/21/2012    Procedure: LEFT HEART CATHETERIZATION WITH CORONARY ANGIOGRAM;  Surgeon: Dolores Patty, MD;  Location: [facility] CATH LAB;  Service: Cardiovascular;  Laterality: N/A;  . Percutaneous coronary stent intervention (pci-s) N/A 07/23/2012    Procedure: PERCUTANEOUS CORONARY STENT INTERVENTION (PCI-S);  Surgeon: Tonny Bollman, MD;  Location: [facility] CATH LAB;  Service: Cardiovascular;  Laterality: N/A;  . Bi-ventricular implantable cardioverter defibrillator N/A 07/06/2014    Procedure: BI-VENTRICULAR IMPLANTABLE CARDIOVERTER DEFIBRILLATOR  (CRT-D);  Surgeon: Duke Salvia, MD;  Location: [facility] CATH LAB;  Service: Cardiovascular;  Laterality: N/A;  . Colonoscopy with propofol Left 02/22/2015    Procedure: COLONOSCOPY WITH PROPOFOL;  Surgeon: Wallace Cullens, MD;  Location: [facility] ENDOSCOPY;  Service: Endoscopy;  Laterality: Left;  . Esophagogastroduodenoscopy N/A 02/22/2015    Procedure: ESOPHAGOGASTRODUODENOSCOPY (EGD);  Surgeon: Wallace Cullens, MD;  Location: [facility] ENDOSCOPY;  Service: Endoscopy;  Laterality: N/A;  . Peripheral vascular catheterization N/A 02/10/2015    Procedure: Picc Line Insertion;  Surgeon: Renford Dills, MD;  Location: [facility] INVASIVE CV LAB;  Service: Cardiovascular;  Laterality: N/A;  . Amputation toe Right 06/18/2015    Procedure: AMPUTATION TOE;  Surgeon: Gwyneth Revels, DPM;  Location: [facility] ORS;  Service: Podiatry;  Laterality: Right;   No Known Allergies Current Outpatient Prescriptions    Medication Sig Dispense Refill  . aspirin EC 81 MG tablet Take 1 tablet (81 mg total) by mouth daily. 90 tablet 3  . Calcium Carbonate (CALCIUM 600 PO) Take by mouth 2 (two) times daily.    . carvedilol (COREG) 3.125 MG tablet Take 1 tablet (3.125 mg total) by mouth 2 (two) times daily. 60 tablet 6  . gabapentin (NEURONTIN) 600 MG tablet Take 1 tablet (600 mg total) by mouth 3 (three) times daily. 270 tablet 3  . Insulin Glargine (LANTUS) 100 UNIT/ML Solostar Pen Inject 10 Units into the skin daily at 10 pm. 15 mL 11  . isosorbide mononitrate (IMDUR) 30 MG 24 hr tablet Take 1 tablet (30 mg total) by mouth daily. 30 tablet 6  . nitroGLYCERIN (NITROSTAT) 0.4 MG SL tablet Place 1 tablet (0.4 mg total) under the tongue every 5 (five) minutes as needed for chest pain. 30 tablet 0  . nortriptyline (PAMELOR) 50 MG capsule TAKE 1 CAPSULE BY MOUTH EVERY NIGHT AT BEDTIME 30 capsule 2  . Omega-3 Fatty Acids (FISH OIL) 1000 MG CAPS Take  100 mg by mouth 2 (two) times daily.    . pravastatin (PRAVACHOL) 40 MG tablet Take 1 tablet (40 mg total) by mouth at bedtime. 90 tablet 3  . traMADol (ULTRAM) 50 MG tablet TAKE 1 TABLET EVERY 6 HOURS AS NEEDED FOR PAIN 120 tablet 5  . traMADol (ULTRAM) 50 MG tablet Take by mouth every 6 (six) hours as needed.     No current facility-administered medications for this visit.   Social History   Social History  . Marital Status: Married    Spouse Name: N/A  . Number of Children: 2  . Years of Education: 12   Occupational History  . disabled     03/2010 for peripheral neuropathy  . home daycare     x 20 yrs.   Social History Main Topics  . Smoking status: Never Smoker   . Smokeless tobacco: Never Used  . Alcohol Use: No  . Drug Use: No  . Sexual Activity: No   Other Topics Concern  . Not on file   Social History Narrative   Marital status:Married x 42 yrs. Happily married, no abuse.       Children:  2 children (daughter 76, son 57). One grandson and 2 step  grandchildren. Daughter and grandson live in home with pt and spouse.       Lives: with husband.      Employment: disability for peripheral neuropathy 2012.  Previously had home daycare.      Tobacco: none      Alcohol: none      Drugs: none      Exercise: none.  [employer].      Pets: dog.      Always uses seat belts, smoke detectors in home.      No guns in the home.       Caffeine use: 2 cups coffee per day.       Nutrition: Well balanced diet.   Family History  Problem Relation Age of Onset  . Diabetes Mother   . Hypertension Mother   . Arthritis Mother     knees, lumbar DDD, cervical DDD  . Cancer Father     prostate,skin,lymphoma.  . Obesity Brother   . Diabetes Maternal Grandmother   . Diabetes Paternal Grandmother        Objective:    BP 126/69 mmHg  Pulse 73  Temp(Src) 97.9 F (36.6 C) (Oral)  Resp 16  Ht 5\' 9"  (1.753 m)  Wt 233 lb 3.2 oz (105.779 kg)  BMI 34.42 kg/m2 Physical Exam  Constitutional: She is oriented to person, place, and time. She appears well-developed and well-nourished. No distress.  obese  HENT:  Head: Normocephalic and atraumatic.  Right Ear: External ear normal.  Left Ear: External ear normal.  Nose: Nose normal.  Mouth/Throat: Oropharynx is clear and moist.  Eyes: Conjunctivae and EOM are normal. Pupils are equal, round, and reactive to light.  Neck: Normal range of motion. Neck supple. Carotid bruit is not present. No thyromegaly present.  Cardiovascular: Normal rate, regular rhythm, normal heart sounds and intact distal pulses.  Exam reveals no gallop and no friction rub.   No murmur heard. 1+ pitting edema BLE.  Pulmonary/Chest: Effort normal and breath sounds normal. She has no wheezes. She has no rales.  Abdominal: Soft. Bowel sounds are normal. She exhibits no distension and no mass. There is no tenderness. There is no rebound and no guarding.  Lymphadenopathy:    She has no  cervical adenopathy.  Neurological: She is  alert and oriented to person, place, and time. No cranial nerve deficit.  Skin: Skin is warm and dry. No rash noted. She is not diaphoretic. No erythema. No pallor.  R forearm with superficial skin flap present; no active bleeding.   L HEEL: mild induration of lateral heel with hypertrophied skin.  No erythema, fluctuants. Mild TTP.  No swelling. FACIAL: scattered excoriations along face.  Psychiatric: She has a normal mood and affect. Her behavior is normal.        Assessment & Plan:   1. Iron deficiency anemia   2. HLD (hyperlipidemia)   3. Essential hypertension, benign   4. Diabetic peripheral neuropathy associated with type 2 diabetes mellitus (HCC)   5. Screening for HIV (human immunodeficiency virus)   6. Need for hepatitis C screening test    1. Iron deficiency anemia: stable; obtain labs. 2. Hyperlipidemia: controlled; no change in management. 3.  HTN: controlled; no change in management. 4.  DMII with peripheral neuropathy: improving control; continue Lantus at current dose; forgetting evening Glimperide; recommend 1/2 Glimeperide qhs.   5. Renal insufficiency: much improved; obtain labs; follow-up with nephrology today; has suffered with hyperkalemia with ACE-I in past. 6.  R foot osteomyelitis: s/p amputation of toe on R.  Healing well. 7. Osteomyelitis L calcaneus: resolved; cleared from wound center; small area of induration present; recheck in two weeks. 8.  Abrasion R forearm: New.  Steristrips placed along wound in office; bandage applied.  Keep clean and covered during the day. 9.  Screening Hepatitis C: obtain 10. Screening HIV; obtain   Orders Placed This Encounter  Procedures  . HIV antibody  . Hepatitis C antibody  . CBC with Differential/Platelet  . Comprehensive metabolic panel   Meds ordered this encounter  Medications  . Calcium Carbonate (CALCIUM 600 PO)    Sig: Take by mouth 2 (two) times daily.    Return in about 2 weeks (around 08/18/2015) for  recheck.     An Lannan Paulita Fujita, M.D. [facility] [address] [place], [place]  [address] [phone] phone [phone] fax

## 2015-08-05 LAB — HEPATITIS C ANTIBODY: HCV AB: NEGATIVE

## 2015-08-08 ENCOUNTER — Encounter: Payer: Self-pay | Admitting: *Deleted

## 2015-08-15 ENCOUNTER — Other Ambulatory Visit: Payer: Self-pay | Admitting: Family Medicine

## 2015-08-16 ENCOUNTER — Other Ambulatory Visit: Payer: Self-pay | Admitting: Family Medicine

## 2015-08-18 NOTE — Patient Outreach (Signed)
[facility]) Care Management  08/18/2015  Lucyana Nimer Acre April 18, 1956 [id number]   Referral from HTA tier 3 list, assigned Johny Shock, RN for patient outreach.  Shaunae Sieloff L. Gehrig Patras, [place] Care Management Assistant

## 2015-08-21 ENCOUNTER — Encounter: Payer: Self-pay | Admitting: Family Medicine

## 2015-08-21 ENCOUNTER — Ambulatory Visit (INDEPENDENT_AMBULATORY_CARE_PROVIDER_SITE_OTHER): Payer: PPO | Admitting: Family Medicine

## 2015-08-21 VITALS — BP 136/73 | HR 78 | Temp 97.8°F | Resp 16 | Ht 68.0 in | Wt 246.4 lb

## 2015-08-21 DIAGNOSIS — S50811D Abrasion of right forearm, subsequent encounter: Secondary | ICD-10-CM | POA: Diagnosis not present

## 2015-08-21 DIAGNOSIS — S91302D Unspecified open wound, left foot, subsequent encounter: Secondary | ICD-10-CM | POA: Diagnosis not present

## 2015-08-21 DIAGNOSIS — M86172 Other acute osteomyelitis, left ankle and foot: Secondary | ICD-10-CM

## 2015-08-21 DIAGNOSIS — N182 Chronic kidney disease, stage 2 (mild): Secondary | ICD-10-CM | POA: Diagnosis not present

## 2015-08-21 DIAGNOSIS — M24542 Contracture, left hand: Secondary | ICD-10-CM

## 2015-08-21 DIAGNOSIS — E1142 Type 2 diabetes mellitus with diabetic polyneuropathy: Secondary | ICD-10-CM

## 2015-08-21 NOTE — Patient Instructions (Signed)
1. Increase Glimiperide to one pill twice daily; take with meals. 2. Start re-padding heel every day. Or try heel pad.

## 2015-08-21 NOTE — Progress Notes (Signed)
Subjective:    Patient ID:  [name], female    DOB: Oct 06, 1956, 59 y.o.   MRN: [id number]  08/21/2015  Follow-up   HPI This 59 y.o. female presents for two week follow-up:   1.  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 S/p neurology consultation; evaluated by neurology. S/p rheumatology consultation; did not feel due to rheumatological process.  Would like to regain some function of hand if possible; requesting hand specialist consultation.   2.  L heel wound: presenting for two week recheck.  Removed bandage that had been applying for the past two months; superficial skin fell off in past week. Heel has been very sore for the past week yet no redness, drainage, swelling. Husband has been looking at heel regularly.    R forearm abrasion: heeling nicely.   3.  DMII: sugar this morning 70; lowest 65.  Lantus 12-17 units depending on sugars; sugar last night 221; so only administered 10 units last night.  Highest sugar 334.   Restarted Glimeperide 1 every morning and 1/2 at bedtime.  Lowest at night is 221.  Average on meter for 26 days was 194.    4. Renal insufficiency:s/p follow-up with nephrology; recent Creatinine normal. Nephrology advised to return in three months; no additional medication.   Review of Systems  Constitutional: Negative for fever, chills, diaphoresis and fatigue.  Eyes: Negative for visual disturbance.  Respiratory: Negative for cough and shortness of breath.   Cardiovascular: Negative for chest pain, palpitations and leg swelling.  Gastrointestinal: Negative for nausea, vomiting, abdominal pain, diarrhea and constipation.  Endocrine: Negative for cold intolerance, heat intolerance, polydipsia, polyphagia and polyuria.  Musculoskeletal: Positive for arthralgias.  Skin: Positive for wound. Negative for color change, pallor and rash.  Neurological: Positive for weakness and numbness. Negative for dizziness, tremors,  seizures, syncope, facial asymmetry, speech difficulty, light-headedness and headaches.    Past Medical History  Diagnosis Date  . Obesity, unspecified   . Unspecified vitamin D deficiency   . Pure hypercholesterolemia   . Neuropathy ([place]) 2011  . Chicken pox   . Chronic systolic CHF (congestive heart failure) (HCC)     a. mixed ICM & NICM; b. EF 20-25% by echo 07/2012, mid-dist 2/3 of LV sev HK/AK, mild MR. echo 10/2012: EF 30-35%, sev HK ant-septal & inf walls, GR1DD, mild MR, PASP 33. c. echo 02/2013: EF 30%, GR1DD, mild MR. echo 04/2014: EF 30%, Septal-lat dyssynchrony, global HK, inf AK, GR1DD, mild MR. d. echo 10/2014: EF50-55%, WM nl, GR1DD, septal mild paradox. e. echo 02/2015: EF 50-55%, wm   . Hypertension   . Anemia   . Automatic implantable cardioverter-defibrillator in situ     a. MDT CRT-D 06/2014, SN: [id number]    . Type II diabetes mellitus ([place])   . CAD (coronary artery disease)     a. cardiac cath 101/04/2012: PCI/DES to chronically occluded mLAD, consideration PCI to diag branch in 4 weeks.   Marland Kitchen History of blood transfusion ~ 2011    "plasma; had neuropathy; couldn't walk"  . OSA on CPAP     Moderate with AHI 23/hr and now on CPAP at 16cm H2O.  Her DME is [facility]  . LBBB (left bundle branch block)    Past Surgical History  Procedure Laterality Date  . Lumbar disc surgery  1996    L4-5  . Incision and drainage abscess Right 2007    groin; with ICU stay due to sepsis.  Marland Kitchen  Subjective:    Patient ID:  [name], female    DOB: Oct 06, 1956, 59 y.o.   MRN: [id number]  08/21/2015  Follow-up   HPI This 59 y.o. female presents for two week follow-up:   1.  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 S/p neurology consultation; evaluated by neurology. S/p rheumatology consultation; did not feel due to rheumatological process.  Would like to regain some function of hand if possible; requesting hand specialist consultation.   2.  L heel wound: presenting for two week recheck.  Removed bandage that had been applying for the past two months; superficial skin fell off in past week. Heel has been very sore for the past week yet no redness, drainage, swelling. Husband has been looking at heel regularly.    R forearm abrasion: heeling nicely.   3.  DMII: sugar this morning 70; lowest 65.  Lantus 12-17 units depending on sugars; sugar last night 221; so only administered 10 units last night.  Highest sugar 334.   Restarted Glimeperide 1 every morning and 1/2 at bedtime.  Lowest at night is 221.  Average on meter for 26 days was 194.    4. Renal insufficiency:s/p follow-up with nephrology; recent Creatinine normal. Nephrology advised to return in three months; no additional medication.   Review of Systems  Constitutional: Negative for fever, chills, diaphoresis and fatigue.  Eyes: Negative for visual disturbance.  Respiratory: Negative for cough and shortness of breath.   Cardiovascular: Negative for chest pain, palpitations and leg swelling.  Gastrointestinal: Negative for nausea, vomiting, abdominal pain, diarrhea and constipation.  Endocrine: Negative for cold intolerance, heat intolerance, polydipsia, polyphagia and polyuria.  Musculoskeletal: Positive for arthralgias.  Skin: Positive for wound. Negative for color change, pallor and rash.  Neurological: Positive for weakness and numbness. Negative for dizziness, tremors,  seizures, syncope, facial asymmetry, speech difficulty, light-headedness and headaches.    Past Medical History  Diagnosis Date  . Obesity, unspecified   . Unspecified vitamin D deficiency   . Pure hypercholesterolemia   . Neuropathy ([place]) 2011  . Chicken pox   . Chronic systolic CHF (congestive heart failure) (HCC)     a. mixed ICM & NICM; b. EF 20-25% by echo 07/2012, mid-dist 2/3 of LV sev HK/AK, mild MR. echo 10/2012: EF 30-35%, sev HK ant-septal & inf walls, GR1DD, mild MR, PASP 33. c. echo 02/2013: EF 30%, GR1DD, mild MR. echo 04/2014: EF 30%, Septal-lat dyssynchrony, global HK, inf AK, GR1DD, mild MR. d. echo 10/2014: EF50-55%, WM nl, GR1DD, septal mild paradox. e. echo 02/2015: EF 50-55%, wm   . Hypertension   . Anemia   . Automatic implantable cardioverter-defibrillator in situ     a. MDT CRT-D 06/2014, SN: [id number]    . Type II diabetes mellitus ([place])   . CAD (coronary artery disease)     a. cardiac cath 101/04/2012: PCI/DES to chronically occluded mLAD, consideration PCI to diag branch in 4 weeks.   Marland Kitchen History of blood transfusion ~ 2011    "plasma; had neuropathy; couldn't walk"  . OSA on CPAP     Moderate with AHI 23/hr and now on CPAP at 16cm H2O.  Her DME is [facility]  . LBBB (left bundle branch block)    Past Surgical History  Procedure Laterality Date  . Lumbar disc surgery  1996    L4-5  . Incision and drainage abscess Right 2007    groin; with ICU stay due to sepsis.  Marland Kitchen  Subjective:    Patient ID:  [name], female    DOB: Oct 06, 1956, 59 y.o.   MRN: [id number]  08/21/2015  Follow-up   HPI This 59 y.o. female presents for two week follow-up:   1.  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 S/p neurology consultation; evaluated by neurology. S/p rheumatology consultation; did not feel due to rheumatological process.  Would like to regain some function of hand if possible; requesting hand specialist consultation.   2.  L heel wound: presenting for two week recheck.  Removed bandage that had been applying for the past two months; superficial skin fell off in past week. Heel has been very sore for the past week yet no redness, drainage, swelling. Husband has been looking at heel regularly.    R forearm abrasion: heeling nicely.   3.  DMII: sugar this morning 70; lowest 65.  Lantus 12-17 units depending on sugars; sugar last night 221; so only administered 10 units last night.  Highest sugar 334.   Restarted Glimeperide 1 every morning and 1/2 at bedtime.  Lowest at night is 221.  Average on meter for 26 days was 194.    4. Renal insufficiency:s/p follow-up with nephrology; recent Creatinine normal. Nephrology advised to return in three months; no additional medication.   Review of Systems  Constitutional: Negative for fever, chills, diaphoresis and fatigue.  Eyes: Negative for visual disturbance.  Respiratory: Negative for cough and shortness of breath.   Cardiovascular: Negative for chest pain, palpitations and leg swelling.  Gastrointestinal: Negative for nausea, vomiting, abdominal pain, diarrhea and constipation.  Endocrine: Negative for cold intolerance, heat intolerance, polydipsia, polyphagia and polyuria.  Musculoskeletal: Positive for arthralgias.  Skin: Positive for wound. Negative for color change, pallor and rash.  Neurological: Positive for weakness and numbness. Negative for dizziness, tremors,  seizures, syncope, facial asymmetry, speech difficulty, light-headedness and headaches.    Past Medical History  Diagnosis Date  . Obesity, unspecified   . Unspecified vitamin D deficiency   . Pure hypercholesterolemia   . Neuropathy ([place]) 2011  . Chicken pox   . Chronic systolic CHF (congestive heart failure) (HCC)     a. mixed ICM & NICM; b. EF 20-25% by echo 07/2012, mid-dist 2/3 of LV sev HK/AK, mild MR. echo 10/2012: EF 30-35%, sev HK ant-septal & inf walls, GR1DD, mild MR, PASP 33. c. echo 02/2013: EF 30%, GR1DD, mild MR. echo 04/2014: EF 30%, Septal-lat dyssynchrony, global HK, inf AK, GR1DD, mild MR. d. echo 10/2014: EF50-55%, WM nl, GR1DD, septal mild paradox. e. echo 02/2015: EF 50-55%, wm   . Hypertension   . Anemia   . Automatic implantable cardioverter-defibrillator in situ     a. MDT CRT-D 06/2014, SN: [id number]    . Type II diabetes mellitus ([place])   . CAD (coronary artery disease)     a. cardiac cath 101/04/2012: PCI/DES to chronically occluded mLAD, consideration PCI to diag branch in 4 weeks.   Marland Kitchen History of blood transfusion ~ 2011    "plasma; had neuropathy; couldn't walk"  . OSA on CPAP     Moderate with AHI 23/hr and now on CPAP at 16cm H2O.  Her DME is [facility]  . LBBB (left bundle branch block)    Past Surgical History  Procedure Laterality Date  . Lumbar disc surgery  1996    L4-5  . Incision and drainage abscess Right 2007    groin; with ICU stay due to sepsis.  Marland Kitchen  Subjective:    Patient ID:  [name], female    DOB: Oct 06, 1956, 59 y.o.   MRN: [id number]  08/21/2015  Follow-up   HPI This 59 y.o. female presents for two week follow-up:   1.  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 S/p neurology consultation; evaluated by neurology. S/p rheumatology consultation; did not feel due to rheumatological process.  Would like to regain some function of hand if possible; requesting hand specialist consultation.   2.  L heel wound: presenting for two week recheck.  Removed bandage that had been applying for the past two months; superficial skin fell off in past week. Heel has been very sore for the past week yet no redness, drainage, swelling. Husband has been looking at heel regularly.    R forearm abrasion: heeling nicely.   3.  DMII: sugar this morning 70; lowest 65.  Lantus 12-17 units depending on sugars; sugar last night 221; so only administered 10 units last night.  Highest sugar 334.   Restarted Glimeperide 1 every morning and 1/2 at bedtime.  Lowest at night is 221.  Average on meter for 26 days was 194.    4. Renal insufficiency:s/p follow-up with nephrology; recent Creatinine normal. Nephrology advised to return in three months; no additional medication.   Review of Systems  Constitutional: Negative for fever, chills, diaphoresis and fatigue.  Eyes: Negative for visual disturbance.  Respiratory: Negative for cough and shortness of breath.   Cardiovascular: Negative for chest pain, palpitations and leg swelling.  Gastrointestinal: Negative for nausea, vomiting, abdominal pain, diarrhea and constipation.  Endocrine: Negative for cold intolerance, heat intolerance, polydipsia, polyphagia and polyuria.  Musculoskeletal: Positive for arthralgias.  Skin: Positive for wound. Negative for color change, pallor and rash.  Neurological: Positive for weakness and numbness. Negative for dizziness, tremors,  seizures, syncope, facial asymmetry, speech difficulty, light-headedness and headaches.    Past Medical History  Diagnosis Date  . Obesity, unspecified   . Unspecified vitamin D deficiency   . Pure hypercholesterolemia   . Neuropathy ([place]) 2011  . Chicken pox   . Chronic systolic CHF (congestive heart failure) (HCC)     a. mixed ICM & NICM; b. EF 20-25% by echo 07/2012, mid-dist 2/3 of LV sev HK/AK, mild MR. echo 10/2012: EF 30-35%, sev HK ant-septal & inf walls, GR1DD, mild MR, PASP 33. c. echo 02/2013: EF 30%, GR1DD, mild MR. echo 04/2014: EF 30%, Septal-lat dyssynchrony, global HK, inf AK, GR1DD, mild MR. d. echo 10/2014: EF50-55%, WM nl, GR1DD, septal mild paradox. e. echo 02/2015: EF 50-55%, wm   . Hypertension   . Anemia   . Automatic implantable cardioverter-defibrillator in situ     a. MDT CRT-D 06/2014, SN: [id number]    . Type II diabetes mellitus ([place])   . CAD (coronary artery disease)     a. cardiac cath 101/04/2012: PCI/DES to chronically occluded mLAD, consideration PCI to diag branch in 4 weeks.   Marland Kitchen History of blood transfusion ~ 2011    "plasma; had neuropathy; couldn't walk"  . OSA on CPAP     Moderate with AHI 23/hr and now on CPAP at 16cm H2O.  Her DME is [facility]  . LBBB (left bundle branch block)    Past Surgical History  Procedure Laterality Date  . Lumbar disc surgery  1996    L4-5  . Incision and drainage abscess Right 2007    groin; with ICU stay due to sepsis.  Marland Kitchen  Subjective:    Patient ID:  [name], female    DOB: Oct 06, 1956, 59 y.o.   MRN: [id number]  08/21/2015  Follow-up   HPI This 59 y.o. female presents for two week follow-up:   1.  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 S/p neurology consultation; evaluated by neurology. S/p rheumatology consultation; did not feel due to rheumatological process.  Would like to regain some function of hand if possible; requesting hand specialist consultation.   2.  L heel wound: presenting for two week recheck.  Removed bandage that had been applying for the past two months; superficial skin fell off in past week. Heel has been very sore for the past week yet no redness, drainage, swelling. Husband has been looking at heel regularly.    R forearm abrasion: heeling nicely.   3.  DMII: sugar this morning 70; lowest 65.  Lantus 12-17 units depending on sugars; sugar last night 221; so only administered 10 units last night.  Highest sugar 334.   Restarted Glimeperide 1 every morning and 1/2 at bedtime.  Lowest at night is 221.  Average on meter for 26 days was 194.    4. Renal insufficiency:s/p follow-up with nephrology; recent Creatinine normal. Nephrology advised to return in three months; no additional medication.   Review of Systems  Constitutional: Negative for fever, chills, diaphoresis and fatigue.  Eyes: Negative for visual disturbance.  Respiratory: Negative for cough and shortness of breath.   Cardiovascular: Negative for chest pain, palpitations and leg swelling.  Gastrointestinal: Negative for nausea, vomiting, abdominal pain, diarrhea and constipation.  Endocrine: Negative for cold intolerance, heat intolerance, polydipsia, polyphagia and polyuria.  Musculoskeletal: Positive for arthralgias.  Skin: Positive for wound. Negative for color change, pallor and rash.  Neurological: Positive for weakness and numbness. Negative for dizziness, tremors,  seizures, syncope, facial asymmetry, speech difficulty, light-headedness and headaches.    Past Medical History  Diagnosis Date  . Obesity, unspecified   . Unspecified vitamin D deficiency   . Pure hypercholesterolemia   . Neuropathy ([place]) 2011  . Chicken pox   . Chronic systolic CHF (congestive heart failure) (HCC)     a. mixed ICM & NICM; b. EF 20-25% by echo 07/2012, mid-dist 2/3 of LV sev HK/AK, mild MR. echo 10/2012: EF 30-35%, sev HK ant-septal & inf walls, GR1DD, mild MR, PASP 33. c. echo 02/2013: EF 30%, GR1DD, mild MR. echo 04/2014: EF 30%, Septal-lat dyssynchrony, global HK, inf AK, GR1DD, mild MR. d. echo 10/2014: EF50-55%, WM nl, GR1DD, septal mild paradox. e. echo 02/2015: EF 50-55%, wm   . Hypertension   . Anemia   . Automatic implantable cardioverter-defibrillator in situ     a. MDT CRT-D 06/2014, SN: [id number]    . Type II diabetes mellitus ([place])   . CAD (coronary artery disease)     a. cardiac cath 101/04/2012: PCI/DES to chronically occluded mLAD, consideration PCI to diag branch in 4 weeks.   Marland Kitchen History of blood transfusion ~ 2011    "plasma; had neuropathy; couldn't walk"  . OSA on CPAP     Moderate with AHI 23/hr and now on CPAP at 16cm H2O.  Her DME is [facility]  . LBBB (left bundle branch block)    Past Surgical History  Procedure Laterality Date  . Lumbar disc surgery  1996    L4-5  . Incision and drainage abscess Right 2007    groin; with ICU stay due to sepsis.  Marland Kitchen

## 2015-08-22 MED ORDER — GLIMEPIRIDE 4 MG PO TABS: 4.0000 mg | ORAL_TABLET | Freq: Two times a day (BID) | ORAL | Status: DC

## 2015-08-23 ENCOUNTER — Encounter: Payer: Self-pay | Admitting: *Deleted

## 2015-08-23 ENCOUNTER — Other Ambulatory Visit: Payer: Self-pay | Admitting: *Deleted

## 2015-08-23 NOTE — Patient Outreach (Signed)
[facility]) Care Management  08/23/2015  [name] 1956/04/19 [id number]   RN Health Coach telephone to tier 3  patient. [employer] compliance verified. RN Health Coach spoke with patient and described the services available through [employer] Patient declined service. RN Health Coach will send an outreach packet to patient.  [facility] [phone]

## 2015-08-28 ENCOUNTER — Encounter: Payer: Self-pay | Admitting: Family Medicine

## 2015-09-04 ENCOUNTER — Telehealth: Payer: Self-pay

## 2015-09-04 NOTE — Telephone Encounter (Signed)
Received form from [name] for pt's DM shoes. They are asking for info and also copy of OV notes. Completed as much as I could on form and printed OV notes. Placed in Dr Thompson Caul box for completion.

## 2015-09-19 ENCOUNTER — Ambulatory Visit (INDEPENDENT_AMBULATORY_CARE_PROVIDER_SITE_OTHER): Payer: PPO | Admitting: *Deleted

## 2015-09-19 ENCOUNTER — Encounter: Payer: Self-pay | Admitting: Internal Medicine

## 2015-09-19 DIAGNOSIS — I447 Left bundle-branch block, unspecified: Secondary | ICD-10-CM

## 2015-09-19 DIAGNOSIS — I5022 Chronic systolic (congestive) heart failure: Secondary | ICD-10-CM | POA: Diagnosis not present

## 2015-09-19 LAB — CUP PACEART INCLINIC DEVICE CHECK
Battery Remaining Longevity: 96 mo
Brady Statistic AP VP Percent: 0.84 %
Brady Statistic AP VS Percent: 0.04 %
Brady Statistic AS VS Percent: 1.73 %
Brady Statistic RA Percent Paced: 0.88 %
Brady Statistic RV Percent Paced: 2.16 %
Date Time Interrogation Session: 20161213140646
HIGH POWER IMPEDANCE MEASURED VALUE: 54 Ohm
Implantable Lead Implant Date: 20150930
Implantable Lead Location: 753859
Implantable Lead Model: 4398
Implantable Lead Model: 5076
Lead Channel Impedance Value: 266 Ohm
Lead Channel Impedance Value: 304 Ohm
Lead Channel Impedance Value: 323 Ohm
Lead Channel Impedance Value: 342 Ohm
Lead Channel Impedance Value: 437 Ohm
Lead Channel Impedance Value: 456 Ohm
Lead Channel Impedance Value: 513 Ohm
Lead Channel Impedance Value: 532 Ohm
Lead Channel Pacing Threshold Amplitude: 1 V
Lead Channel Pacing Threshold Amplitude: 1 V
Lead Channel Pacing Threshold Pulse Width: 0.4 ms
Lead Channel Sensing Intrinsic Amplitude: 25.25 mV
Lead Channel Setting Pacing Amplitude: 1.5 V
Lead Channel Setting Pacing Pulse Width: 0.4 ms
Lead Channel Setting Sensing Sensitivity: 0.3 mV
MDC IDC LEAD IMPLANT DT: 20150930
MDC IDC LEAD IMPLANT DT: 20150930
MDC IDC LEAD LOCATION: 753857
MDC IDC LEAD LOCATION: 753860
MDC IDC MSMT BATTERY VOLTAGE: 3 V
MDC IDC MSMT LEADCHNL LV IMPEDANCE VALUE: 342 Ohm
MDC IDC MSMT LEADCHNL LV IMPEDANCE VALUE: 494 Ohm
MDC IDC MSMT LEADCHNL LV IMPEDANCE VALUE: 532 Ohm
MDC IDC MSMT LEADCHNL LV IMPEDANCE VALUE: 570 Ohm
MDC IDC MSMT LEADCHNL LV PACING THRESHOLD PULSEWIDTH: 0.4 ms
MDC IDC MSMT LEADCHNL RA PACING THRESHOLD AMPLITUDE: 0.5 V
MDC IDC MSMT LEADCHNL RA SENSING INTR AMPL: 1.875 mV
MDC IDC MSMT LEADCHNL RV IMPEDANCE VALUE: 399 Ohm
MDC IDC MSMT LEADCHNL RV PACING THRESHOLD PULSEWIDTH: 0.4 ms
MDC IDC SET LEADCHNL LV PACING AMPLITUDE: 2 V
MDC IDC STAT BRADY AS VP PERCENT: 97.39 %

## 2015-09-19 NOTE — Progress Notes (Signed)
CRT-D device check in office. RA and RV thresholds and sensing consistent with previous device measurements; LV threshold slightly increased at 1.0V@0 .26ms. Lead impedance trends stable over time. 4 AT/AF episodes recorded (<0.1%), EGMs suggest AFl, +ASA 81mg , hx GI bleed in 02/2015, longest 41min, peak A 245, peak V 153. No ventricular arrhythmia episodes recorded. 1,641 Vs episodes, longest ~46min, markers suggest 1:1 SVT. Patient bi-ventricularly pacing 97.8% of the time. Device programmed with appropriate safety margins; LV output reprogrammed to 2.0V. Heart failure diagnostics reviewed, trends unstable for patient, patient experiencing increased SOB and possible weight gain, made Dr. Fletcher Anon and SK aware via staff message. Patient is interested in [facility] clinic. Estimated longevity 7.9 years. Patient enrolled in remote follow up. Patient education completed including shock plan. ROV with SK on 10/24/15 at 3:15pm.

## 2015-09-22 ENCOUNTER — Telehealth: Payer: Self-pay | Admitting: *Deleted

## 2015-09-22 DIAGNOSIS — I5021 Acute systolic (congestive) heart failure: Secondary | ICD-10-CM

## 2015-09-22 MED ORDER — TORSEMIDE 20 MG PO TABS: 20.0000 mg | ORAL_TABLET | Freq: Every day | ORAL | Status: DC

## 2015-09-22 NOTE — Telephone Encounter (Signed)
-----   Message from Wellington Hampshire, MD sent at 09/22/2015  7:58 AM EST ----- Regarding: RE: abnormal thoracic impedance She is supposed to be on Torsemide 20 mg once daily. It was not stopped. This should be resumed and ok to refer to ICM clinic.     ----- Message -----    From: Mechele Dawley, RN    Sent: 09/19/2015   8:47 AM      To: Deboraha Sprang, MD, Wellington Hampshire, MD Subject: abnormal thoracic impedance                    At device clinic appointment this morning, noted that Ms. Mcmorris's OptiVol/thoracic impedance has been abnormal since 11/5, seems to be trending back to baseline slowly.  She is unsure if she is still supposed to take torsemide, appears that it was stopped at her most recent office visit on 10/20.  She has experienced increased SOB and possibly weight gain (but does not weigh daily).  Please advise if patient should take torsemide.  Also, is it okay to add patient the ICM clinic for monthly OptiVol monitoring?  Thanks! Raquel Sarna

## 2015-09-22 NOTE — Telephone Encounter (Signed)
Made patient aware that she should resume taking her Torsemide 20mg  once daily.  She is aware and agreeable to this plan.  Medication re-ordered electronically.  Patient denies any additional questions or concerns at this time and is appreciative of call.  She is aware to call with worsening symptoms, questions, or concerns.  Patient to be followed in [facility] by Sharman Cheek, RN.

## 2015-10-05 ENCOUNTER — Telehealth: Payer: Self-pay

## 2015-10-05 NOTE — Telephone Encounter (Signed)
Attempted ICM introduction call and left message for return call.  Patient referred by Levander Campion, device clinic and Dr Fletcher Anon for ICM enrollment.  Patient had abnormal Optivol thoracic impedance at last pacer interrogation on 09/19/2015.  Torsemide had been stopped at office visit on 07/27/2015 and she was having symptoms of SOB and possible weight gain.  She was restarted on Torsemide 20 mg daily.

## 2015-10-05 NOTE — Telephone Encounter (Signed)
Attempted call back to patient for ICM intro.  No answer.   Received voice mail message from patient stating she was returning my call.

## 2015-10-06 NOTE — Telephone Encounter (Signed)
Attempted ICM intro call and left message.

## 2015-10-16 ENCOUNTER — Ambulatory Visit (INDEPENDENT_AMBULATORY_CARE_PROVIDER_SITE_OTHER): Payer: PPO | Admitting: Neurology

## 2015-10-16 ENCOUNTER — Encounter: Payer: Self-pay | Admitting: Neurology

## 2015-10-16 VITALS — BP 138/84 | HR 70 | Ht 68.0 in | Wt 250.0 lb

## 2015-10-16 DIAGNOSIS — E0842 Diabetes mellitus due to underlying condition with diabetic polyneuropathy: Secondary | ICD-10-CM

## 2015-10-16 DIAGNOSIS — G8332 Monoplegia, unspecified affecting left dominant side: Secondary | ICD-10-CM

## 2015-10-16 NOTE — Patient Instructions (Signed)
Return to clinic in 6 months or sooner as needed

## 2015-10-16 NOTE — Progress Notes (Signed)
Follow-up Visit   Date: 10/16/2015    [name] MRN: [id number] DOB: [birth year]   Interim History: [name] is a 60 y.o. right-handed Caucasian female with diabetes mellitus type 2 since age of 51 complicated by neuropathy, left leg diabetic amyotrophy (2011), and poor wound healing returning to the clinic for follow-up of peripheral neuropathy.  The patient was accompanied to the clinic by husband who also provides collateral information.    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 At that time, she was under the care of Dr. Sandria [name] at [facility], who has since retired. Hemoglobin A1c in 2011 was noted to be 15 and she had associated loss of 130 pounds in the preceding year. Slowly, she developed left knee buckling and leg weakness which progressed to where she had a fall and had to be hospitalized. She has had persistent numbness involving her left lower extremity. Imaging at that time showed multilevel spondylosis as well as postsurgical L4-5 and L5-S1 left hemilaminectomy and discectomy. Electrodiagnostic showed profound denervation involving the vastus medialis and iliopsoas muscles in addition to sensory abnormalities, suggesting diabetic amyotrophy. She underwent treatment with IVIG and was ultimately discharged to rehabilitation facility. Over the next 8 months, she continued to do rehabilitation and was eventually able to walk independently again, but still using a walker as needed.  Patient was reportedly doing well until February 2016 when she slowly started developing painless left foot drop and bilateral proximal leg weakness. Prior to February, she was walking independently, but now is dependent on a walk, and unable to make transfers independently. She has functionally dependent on her family members to transfer out of chairs, off the commode, as well as into and out of a  car. She is able to bathe and dress herself. He has not had any falls but is very cautious when walking. She endorses difficulty with climbing stairs and walking. She has residual numbness of the left leg.  In mid-April, she developed weakness of the left last two fingers, where she is unable to extend the 4th and 5th digits on the left. She has difficulty gripping things with her left hand. There is no associated numbness/tingling of the hand.   UPDATE 06/15/2015:  Patient has underwent a battery of tests since her last visit including serum, CSF testing, and EMGs.  There was evidence of sensorimotor neuropathy as well as polyradiculoneuropathy.  CSF testing showed slightly elevated protein, otherwise was normal.  More notable was that her inflammatory markers, ANA, and p-ANCA returned elevated.  She saw rheumatology who ordered additional testing, but patient is unaware of these results and did not have a return visit.  She has been undergoing hyperbaric oxygen therapy for her wound which has helped left heel ulcer, but now she has right toe ulcer. In doing this, she was also started on insulin for close blood glucose control.   The strength of her legs has significantly improved and over the past month, she has been walking independently.  Her left hand remains contracted which limits her ability to use it.  UPDATE 10/15/2014:  She has continued to see improvement in her motor strength.  She has been driving since October and able to control the pedal without difficulty.  She had residual left foot drop from 2011, but overall feels back to herself.  She can still tire with prolonged activity. No interval falls and no new complaints. She  is requesting handicap placard for parking.  Medications:  Current Outpatient Prescriptions on File Prior to Visit  Medication Sig Dispense Refill  . aspirin EC 81 MG tablet Take 1 tablet (81 mg total) by mouth daily. 90 tablet 3  . Calcium Carbonate (CALCIUM 600 PO)  Take by mouth 2 (two) times daily.    . carvedilol (COREG) 3.125 MG tablet Take 1 tablet (3.125 mg total) by mouth 2 (two) times daily. 60 tablet 6  . gabapentin (NEURONTIN) 600 MG tablet TAKE 1 TABLET BY MOUTH 3 TIMES DAILY 270 tablet 0  . glimepiride (AMARYL) 4 MG tablet Take 1 tablet (4 mg total) by mouth 2 (two) times daily with a meal. 30 tablet 3  . Insulin Glargine (LANTUS) 100 UNIT/ML Solostar Pen Inject 10 Units into the skin daily at 10 pm. (Patient taking differently: Inject 10 Units into the skin daily at 10 pm. Between 12-17 units every night) 15 mL 11  . isosorbide mononitrate (IMDUR) 30 MG 24 hr tablet Take 1 tablet (30 mg total) by mouth daily. 30 tablet 6  . Omega-3 Fatty Acids (FISH OIL) 1000 MG CAPS Take 100 mg by mouth 2 (two) times daily.    . pravastatin (PRAVACHOL) 40 MG tablet Take 1 tablet (40 mg total) by mouth at bedtime. 90 tablet 3  . torsemide (DEMADEX) 20 MG tablet Take 1 tablet (20 mg total) by mouth daily. 30 tablet 11  . traMADol (ULTRAM) 50 MG tablet TAKE 1 TABLET EVERY 6 HOURS AS NEEDED FOR PAIN 120 tablet 5  . nitroGLYCERIN (NITROSTAT) 0.4 MG SL tablet Place 1 tablet (0.4 mg total) under the tongue every 5 (five) minutes as needed for chest pain. (Patient not taking: Reported on 10/16/2015) 30 tablet 0   No current facility-administered medications on file prior to visit.    Allergies: No Known Allergies  Review of Systems:  CONSTITUTIONAL: No fevers, chills, night sweats, or weight loss.  EYES: No visual changes or eye pain ENT: No hearing changes.  No history of nose bleeds.   RESPIRATORY: No cough, wheezing and shortness of breath.   CARDIOVASCULAR: Negative for chest pain, and palpitations.   GI: Negative for abdominal discomfort, blood in stools or black stools.  No recent change in bowel habits.   GU:  No history of incontinence.   MUSCLOSKELETAL: +history of joint pain or swelling.  No myalgias.   SKIN: Negative for lesions, rash, and itching.     ENDOCRINE: Negative for cold or heat intolerance, polydipsia or goiter.   PSYCH:  No depression or anxiety symptoms.   NEURO: As Above.   Vital Signs:  BP 138/84 mmHg  Pulse 70  Ht 5\' 8"  (1.727 m)  Wt 250 lb (113.399 kg)  BMI 38.02 kg/m2  SpO2 99%  Neurological Exam: MENTAL STATUS including orientation to time, place, person, recent and remote memory, attention span and concentration, language, and fund of knowledge is normal.  Speech is not dysarthric.  CRANIAL NERVES:  Pupils equal round and reactive to light.  Normal conjugate, extra-ocular eye movements in all directions of gaze.  No ptosis.  Face is symmetric. Palate elevates symmetrically.  Tongue is midline.    MOTOR:  Left TA atrophy, fasciculations or abnormal movements.  No pronator drift.  Tone is normal.    Right Upper Extremity:    Left Upper Extremity:    Deltoid  5/5   Deltoid  5/5   Biceps  5/5   Biceps  5/5   Triceps  5/5   Triceps  5/5   Wrist extensors  5/5   Wrist extensors  5/5   Wrist flexors  5/5   Wrist flexors  5/5   Finger extensors  5/5   Finger extensors * Limited by flexion contracture of the 3-5th digits at the MCP and PIP 4+/5   Finger flexors  5/5   Finger flexors  5/5   Dorsal interossei  5/5   Dorsal interossei  5/5   Abductor pollicis  5/5   Abductor pollicis  5/5   Tone (Ashworth scale)  0  Tone (Ashworth scale)  0   Right Lower Extremity:    Left Lower Extremity:    Hip flexors  5/5   Hip flexors  5/5   Hip extensors  5/5   Hip extensors  5/5   Knee flexors  5/5   Knee flexors  5/5   Knee extensors  5/5   Knee extensors  5/5   Dorsiflexors  5/5   Dorsiflexors  3-/5   Plantarflexors  5/5   Plantarflexors  3/5   Toe extensors  5/5   Toe extensors  2/5   Toe flexors  5/5   Toe flexors  2/5   Tone (Ashworth scale)  0  Tone (Ashworth scale)  0    MSRs:  Right                                                                 Left brachioradialis 2+  brachioradialis 2+  biceps 2+  biceps  2+  triceps 1+  triceps 1+  patellar trace  patellar 0  ankle jerk 0  ankle jerk 0  Hoffman no  Hoffman no  plantar response down  plantar response down   SENSORY: Vibration and temperature absent at ankles bilaterally, intact at knees.  Pin prick intact in the R foot (improved).   Sensation intact in the upper extremities.  COORDINATION/GAIT:  Left foot drop (old) with mild high steppage on the left, otherwise walking independently and appears stable  Data: MRI lumbar spine 05/17/2010: 1. Unchanged MRI from 03/22/2010. No epidural abscess is identified. No compression fracture or acute abnormality. 2. Postoperative changes on the left at L4-L5 and L5-S1. 3. Unchanged left paracentral and posterolateral shallow disc protrusion at L5-S1 potentially affecting the descending left S1 nerve root.  MRI brain wo contrast 05/17/2010: Small white matter lesions in the frontal lobes bilaterally. These are likely due to chronic microvascular ischemia. Demyelinating disease is not considered likely in this case. No acute abnormality.  EMG of the right lower extremity 03/28/2015: The electrophysiologic findings are most consistent with a severe generalized sensorimotor polyneuropathy, axon loss in type, affecting the right lower extremity. A superimposed multilevel intraspinal canal lesion (i.e. radiculopathy) cannot be excluded.  EMG of the upper extremities 03/21/2015:  The electrophysiologic findings are most consistent with a severe subacute sensorimotor polyradiculoneuropathy affecting the upper extremities  Labs 01/22/2015:  SPEP/UPEP with IFE no M protein, ANA 1:640* homogenous pattern, P-ANCA 1:640*, RF < 10, ACE 46, copper 131, ceruloplasmin 31, ESR 65, vitamin B1 17, CK 24, ENA negative, CRP 15.2*, cryoglobulins none detected, aldolase 8.0  CSF 04/28/2015:  W1  R0  G73  P63*   Cryptococcus neg, ACE 11, one OCB in CSF  and serum, MBP <2.0, Lyme neg, IgG index 0.45  Lab Results  Component  Value Date   HGBA1C 8.2* 06/16/2015     IMPRESSION/PLAN: 1. 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 inflammatory neuropathy (elevated CRP and ESR, ANA and p-ANCA positivity), improved without intervention and clinically back to baseline left foot motor deficits. I will continue to follow her clinically.  - Form completed for handicap placard due to her inability to walk prolonged distance without rest  2.  Diabetes mellitus, now on insulin; well-controlled  3.  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  Obesity, encouraged staying active and starting water exercises as well as healthy eating habits  5.  Left finger flexion contracture, follow-up with ortho   Return to clinic in 6 months  The duration of this appointment visit was 20 minutes of face-to-face time with the patient.  Greater than 50% of this time was spent in counseling, explanation of diagnosis, planning of further management, and coordination of care.   Thank you for allowing me to participate in patient's care.  If I can answer any additional questions, I would be pleased to do so.    Sincerely,    Ahman Dugdale K. Allena Katz, DO

## 2015-10-20 DIAGNOSIS — H43811 Vitreous degeneration, right eye: Secondary | ICD-10-CM | POA: Diagnosis not present

## 2015-10-20 DIAGNOSIS — E113292 Type 2 diabetes mellitus with mild nonproliferative diabetic retinopathy without macular edema, left eye: Secondary | ICD-10-CM | POA: Diagnosis not present

## 2015-10-20 DIAGNOSIS — H25813 Combined forms of age-related cataract, bilateral: Secondary | ICD-10-CM | POA: Diagnosis not present

## 2015-10-20 DIAGNOSIS — E113211 Type 2 diabetes mellitus with mild nonproliferative diabetic retinopathy with macular edema, right eye: Secondary | ICD-10-CM | POA: Diagnosis not present

## 2015-10-23 ENCOUNTER — Telehealth: Payer: Self-pay

## 2015-10-23 NOTE — Telephone Encounter (Signed)
Left voicemail informing patient of appointment change,mailed letter [name]

## 2015-10-24 ENCOUNTER — Ambulatory Visit (INDEPENDENT_AMBULATORY_CARE_PROVIDER_SITE_OTHER): Payer: PPO | Admitting: Internal Medicine

## 2015-10-24 ENCOUNTER — Encounter: Payer: Self-pay | Admitting: Internal Medicine

## 2015-10-24 VITALS — BP 156/74 | HR 74 | Ht 68.0 in | Wt 254.0 lb

## 2015-10-24 DIAGNOSIS — I447 Left bundle-branch block, unspecified: Secondary | ICD-10-CM

## 2015-10-24 DIAGNOSIS — I5022 Chronic systolic (congestive) heart failure: Secondary | ICD-10-CM | POA: Diagnosis not present

## 2015-10-24 DIAGNOSIS — Z95810 Presence of automatic (implantable) cardiac defibrillator: Secondary | ICD-10-CM | POA: Diagnosis not present

## 2015-10-24 DIAGNOSIS — I255 Ischemic cardiomyopathy: Secondary | ICD-10-CM | POA: Diagnosis not present

## 2015-10-24 LAB — BASIC METABOLIC PANEL
BUN: 40 mg/dL — ABNORMAL HIGH (ref 7–25)
CALCIUM: 9.1 mg/dL (ref 8.6–10.4)
CO2: 29 mmol/L (ref 20–31)
Chloride: 101 mmol/L (ref 98–110)
Creat: 1.66 mg/dL — ABNORMAL HIGH (ref 0.50–0.99)
Glucose, Bld: 118 mg/dL — ABNORMAL HIGH (ref 65–99)
Potassium: 4.9 mmol/L (ref 3.5–5.3)
SODIUM: 140 mmol/L (ref 135–146)

## 2015-10-24 NOTE — Patient Instructions (Signed)
Medication Instructions: - no changes  Labwork: - Your physician recommends that you have lab work today: BMP  Procedures/Testing: - Your physician has recommended that you have an AV optimization echo- in [place] (Medtronic ICD). During this procedure, an echocardiogram is performed to optimize the timing of your device using ultrasound and a device programmer. Changes will be made to the device settings to help the heart chambers pump more efficiently. This procedure takes approximately one hour.  - A chest x-ray takes a picture of the organs and structures inside the chest, including the heart, lungs, and blood vessels. This test can show several things, including, whether the heart is enlarges; whether fluid is building up in the lungs; and whether pacemaker / defibrillator leads are still in place.- you may go to [facility]- enter through [place] - 1st desk on the right to register   Follow-Up: - Remote monitoring is used to monitor your Pacemaker of ICD from home. This monitoring reduces the number of office visits required to check your device to one time per year. It allows [place] to keep an eye on the functioning of your device to ensure it is working properly. You are scheduled for a device check from home on 01/23/16. You may send your transmission at any time that day. If you have a wireless device, the transmission will be sent automatically. After your physician reviews your transmission, you will receive a postcard with your next transmission date.  - Your physician wants you to follow-up in: October 2017 with Dr. Caryl Comes. You will receive a reminder letter in the mail two months in advance. If you don't receive a letter, please call our office to schedule the follow-up appointment.  Any Additional Special Instructions Will Be Listed Below (If Applicable). - none    If you need a refill on your cardiac medications before your next appointment, please call  your pharmacy.

## 2015-10-24 NOTE — Progress Notes (Signed)
Review    Electrophysiology Office Note   Date:  10/24/2015   ID:  [name], DOB [birth year], MRN [id number]  PCP:  [name], MD  Cardiologist:  CH[facility] Primary Electrophysiologist:    Virl Axe, MD    Chief Complaint  Patient presents with  . Pacemaker Check     History of Present Illness: [name] is a 60 y.o. female  seen today for follow-up of CRT-D implantation undertaken 9/15. 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   She was seen a couple of weeks ago in [place]. Her optivol is markedly increased and her volume status was overloaded with worsening dyspnea edema and orthopnea. She was resumed on her torsemide with a marked improvement in symptoms and as noted below a improvement in her optivol  She has morbid obesity. She doesn't smoke.  She has OSA on CPAP     Past Medical History  Diagnosis Date  . Obesity, unspecified   . Unspecified vitamin D deficiency   . Pure hypercholesterolemia   . Neuropathy ([place]) 2011  . Chicken pox   . Chronic systolic CHF (congestive heart failure) (HCC)     a. mixed ICM & NICM; b. EF 20-25% by echo 07/2012, mid-dist 2/3 of LV sev HK/AK, mild MR. echo 10/2012: EF 30-35%, sev HK ant-septal & inf walls, GR1DD, mild MR, PASP 33. c. echo 02/2013: EF 30%, GR1DD, mild MR. echo 04/2014: EF 30%, Septal-lat dyssynchrony, global HK, inf AK, GR1DD, mild MR. d. echo 10/2014: EF50-55%, WM nl, GR1DD, septal mild paradox. e. echo 02/2015: EF 50-55%, wm   . Hypertension   . Anemia   . Automatic implantable cardioverter-defibrillator in situ     a. MDT CRT-D 06/2014, [id number]    . Type II diabetes mellitus ([place])   . CAD (coronary artery disease)     a. cardiac cath 101/04/2012: PCI/DES to chronically occluded mLAD, consideration PCI to diag branch in 4 weeks.   Marland Kitchen History of blood transfusion ~ 2011    "plasma; had neuropathy; couldn't walk"  . OSA on CPAP     Moderate with  AHI 23/hr and now on CPAP at 16cm H2O.  Her DME is [facility]  . LBBB (left bundle branch block)    Past Surgical History  Procedure Laterality Date  . Lumbar disc surgery  1996    L4-5  . Incision and drainage abscess Right 2007    groin; with ICU stay due to sepsis.  . Bilateral oophorectomy  01/2011    ovarian cyst benign  . Bi-ventricular implantable cardioverter defibrillator  (crt-d)  07/06/2014  . Laparoscopic cholecystectomy  2011  . Back surgery    . Coronary angioplasty with stent placement Left 07/2012    new onset systolic CHF; elevated troponins.  Cardiac catheterization with stenting to LAD; EF 15%.  2D-echo: EF 20-25%.  . Vaginal hysterectomy  01/2011    Fibroids/DUB.  Ovaries removed. [place].  . Tubal ligation  1981  . Left heart catheterization with coronary angiogram N/A 07/21/2012    Procedure: LEFT HEART CATHETERIZATION WITH CORONARY ANGIOGRAM;  Surgeon: Jolaine Artist, MD;  Location[facility] C[facility] LAB;  Service: Cardiovascular;  Laterality: N/A;  . Percutaneous coronary stent intervention (pci-s) N/A 07/23/2012    Procedure: PERCUTANEOUS CORONARY STENT INTERVENTION (PCI-S);  Surgeon: Sherren Mocha, MD;  Location: [facility] CATH LAB;  Service: Cardiovascular;  Laterality: N/A;  . Bi-ventricular implantable cardioverter defibrillator N/A 07/06/2014    Procedure: BI-VENTRICULAR  IMPLANTABLE CARDIOVERTER DEFIBRILLATOR  (CRT-D);  Surgeon: Deboraha Sprang, MD;  Location: [facility] CATH LAB;  Service: Cardiovascular;  Laterality: N/A;  . Colonoscopy with propofol Left 02/22/2015    Procedure: COLONOSCOPY WITH PROPOFOL;  Surgeon: Hulen Luster, MD;  Location: [facility] ENDOSCOPY;  Service: Endoscopy;  Laterality: Left;  . Esophagogastroduodenoscopy N/A 02/22/2015    Procedure: ESOPHAGOGASTRODUODENOSCOPY (EGD);  Surgeon: Hulen Luster, MD;  Location: [facility] ENDOSCOPY;  Service: Endoscopy;  Laterality: N/A;  . Peripheral vascular catheterization N/A 02/10/2015    Procedure: Picc Line Insertion;  Surgeon: Katha Cabal, MD;  Location: [place] CV LAB;  Service: Cardiovascular;  Laterality: N/A;  . Amputation toe Right 06/18/2015    Procedure: AMPUTATION TOE;  Surgeon: Samara Deist, DPM;  Location: [facility] ORS;  Service: Podiatry;  Laterality: Right;     Current Outpatient Prescriptions  Medication Sig Dispense Refill  . aspirin EC 81 MG tablet Take 1 tablet (81 mg total) by mouth daily. 90 tablet 3  . Calcium Carbonate (CALCIUM 600 PO) Take by mouth 2 (two) times daily.    . carvedilol (COREG) 3.125 MG tablet Take 1 tablet (3.125 mg total) by mouth 2 (two) times daily. 60 tablet 6  . gabapentin (NEURONTIN) 600 MG tablet TAKE 1 TABLET BY MOUTH 3 TIMES DAILY 270 tablet 0  . glimepiride (AMARYL) 4 MG tablet Take 1 tablet (4 mg total) by mouth 2 (two) times daily with a meal. 30 tablet 3  . Insulin Glargine (LANTUS) 100 UNIT/ML Solostar Pen Inject 10 Units into the skin daily at 10 pm. 15 mL 11  . isosorbide mononitrate (IMDUR) 30 MG 24 hr tablet Take 1 tablet (30 mg total) by mouth daily. 30 tablet 6  . MULTIPLE VITAMIN PO Take by mouth daily.    . nitroGLYCERIN (NITROSTAT) 0.4 MG SL tablet Place 0.4 mg under the tongue every 5 (five) minutes as needed for chest pain.    . Omega-3 Fatty Acids (FISH OIL) 1000 MG CAPS Take 100 mg by mouth 2 (two) times daily.    . pravastatin (PRAVACHOL) 40 MG tablet Take 1 tablet (40 mg total) by mouth at bedtime. 90 tablet 3  . torsemide (DEMADEX) 20 MG tablet Take 1 tablet (20 mg total) by mouth daily. 30 tablet 11  . traMADol (ULTRAM) 50 MG tablet TAKE 1 TABLET EVERY 6 HOURS AS NEEDED FOR PAIN 120 tablet 5   No current facility-administered medications for this visit.    Allergies:   Review of patient's allergies indicates no known allergies.   Social History:  The patient  reports that she has never smoked. She has never used smokeless tobacco. She reports that she does not drink alcohol or use illicit drugs.   Family History:  The patient's family history  includes Arthritis in her mother; Cancer in her father; Diabetes in her maternal grandmother, mother, and paternal grandmother; Hypertension in her mother; Obesity in her brother.    ROS:  Please see the history of present illness.   .   All other systems are reviewed and negative.    PHYSICAL EXAM: VS:  BP 156/74 mmHg  Pulse 74  Ht 5\' 8"  (1.727 m)  Wt 254 lb (115.214 kg)  BMI 38.63 kg/m2 , BMI Body mass index is 38.63 kg/(m^2). GEN: Well nourished, well developed, in no acute distress HEENT: normal Neck: JVP 8 carotid bruits, or masses Cardiac: REGULAR RATE and RHYTHM ; 2/6  murmurs, rubs, + S4  Back without kyphosis or CVAT Respiratory:  clear to auscultation bilaterally, normal work of breathing GI: soft, nontender, nondistended, + BS MS: no deformity or atrophy Extremities no clubbing cyanosis 1+ edema Skin: warm and dry,  device pocket is well healed Neuro:  Strength and sensation are intact Psych: euthymic mood, full affect  EKG:  EKG is ordered today. The ekg ordered today shows P-synchronous/ AV  pacing QRS morphology is down in lead V1   Device interrogation is reviewed today in detail.  See PaceArt for details.   Recent Labs: 12/15/2014: B Natriuretic Peptide 78.4 02/22/2015: Magnesium 1.3* 06/16/2015: TSH 3.475 08/04/2015: ALT 16; BUN 26*; Creat 1.03*; Hemoglobin 11.7*; Platelets 178; Potassium 4.5; Sodium 140    Lipid Panel     Component Value Date/Time   CHOL 166 06/26/2015 1655   TRIG 125 06/26/2015 1655   HDL 37* 06/26/2015 1655   CHOLHDL 4.5 06/26/2015 1655   VLDL 25 06/26/2015 1655   LDLCALC 104 06/26/2015 1655     Wt Readings from Last 3 Encounters:  10/24/15 254 lb (115.214 kg)  10/16/15 250 lb (113.399 kg)  08/21/15 246 lb 6.4 oz (111.766 kg)      Other studies Reviewed:    ASSESSMENT AND PLAN: Ischemic cardiomyopathy  CRT-D  CHF chronic systolic  Abnormal QRS vector  Renal Insufficiency      Computer does not allow access to  the x-ray   Her volume is much improved. We will continue her on her current dose of torsemide; we'll check her laboratories.  We will undertake a chest x-ray to look for lead location. She has diaphragmatic stimulation with poles 1-2 confirming coronary sinus location. 3-2 is most distal that we can use.  We will also look at AV timing with an AV optimization echo  Current medicines are reviewed at length with the patient today.   The patient does not have concerns regarding her medicines.  The following changes were made today:  none  Labs/ tests ordered today include: eco cxr  No orders of the defined types were placed in this encounter.     Disposition:   FU with me 9 month(s)  Signed, Virl Axe, MD  10/24/2015 4:17 PM     [place] [address] [phone] (office) [phone] (fax)

## 2015-10-25 ENCOUNTER — Telehealth: Payer: Self-pay | Admitting: *Deleted

## 2015-10-25 ENCOUNTER — Ambulatory Visit
Admission: RE | Admit: 2015-10-25 | Discharge: 2015-10-25 | Disposition: A | Discharge status: Home / Self Care | Payer: PPO | Source: Ambulatory Visit | Attending: Internal Medicine | Admitting: Internal Medicine

## 2015-10-25 DIAGNOSIS — J90 Pleural effusion, not elsewhere classified: Secondary | ICD-10-CM | POA: Insufficient documentation

## 2015-10-25 DIAGNOSIS — Z95810 Presence of automatic (implantable) cardiac defibrillator: Secondary | ICD-10-CM | POA: Diagnosis not present

## 2015-10-25 DIAGNOSIS — I5022 Chronic systolic (congestive) heart failure: Secondary | ICD-10-CM

## 2015-10-25 NOTE — Telephone Encounter (Signed)
Labs reviewed from 10/24/15, spoke with Cheral Bay PA-C. Wait for Dr Caryl Comes to review since she was seen yesterday.  Telephone note sent to Dr Caryl Comes and Nira Conn RN to address.

## 2015-10-25 NOTE — Telephone Encounter (Signed)
Attempted ICM intro call to patient and no answer. 

## 2015-10-26 NOTE — Telephone Encounter (Signed)
See result note on lab 1/17.

## 2015-10-26 NOTE — Addendum Note (Signed)
Addended by: Alvis Lemmings C on: 10/26/2015 06:42 PM   Modules accepted: Orders

## 2015-10-27 LAB — CUP PACEART INCLINIC DEVICE CHECK
Battery Remaining Longevity: 95 mo
Battery Voltage: 3 V
Brady Statistic AP VP Percent: 1.96 %
Brady Statistic AS VP Percent: 95.57 %
Brady Statistic RA Percent Paced: 2.05 %
Brady Statistic RV Percent Paced: 4.9 %
HIGH POWER IMPEDANCE MEASURED VALUE: 63 Ohm
Implantable Lead Implant Date: 20150930
Implantable Lead Implant Date: 20150930
Implantable Lead Location: 753857
Implantable Lead Location: 753860
Implantable Lead Model: 4398
Implantable Lead Model: 5076
Lead Channel Impedance Value: 342 Ohm
Lead Channel Impedance Value: 342 Ohm
Lead Channel Impedance Value: 342 Ohm
Lead Channel Impedance Value: 380 Ohm
Lead Channel Impedance Value: 437 Ohm
Lead Channel Impedance Value: 570 Ohm
Lead Channel Impedance Value: 589 Ohm
Lead Channel Pacing Threshold Amplitude: 0.5 V
Lead Channel Sensing Intrinsic Amplitude: 1.875 mV
Lead Channel Setting Pacing Amplitude: 1.5 V
Lead Channel Setting Pacing Pulse Width: 0.4 ms
Lead Channel Setting Sensing Sensitivity: 0.3 mV
MDC IDC LEAD IMPLANT DT: 20150930
MDC IDC LEAD LOCATION: 753859
MDC IDC MSMT LEADCHNL LV IMPEDANCE VALUE: 323 Ohm
MDC IDC MSMT LEADCHNL LV IMPEDANCE VALUE: 456 Ohm
MDC IDC MSMT LEADCHNL LV IMPEDANCE VALUE: 589 Ohm
MDC IDC MSMT LEADCHNL LV IMPEDANCE VALUE: 627 Ohm
MDC IDC MSMT LEADCHNL LV IMPEDANCE VALUE: 627 Ohm
MDC IDC MSMT LEADCHNL LV PACING THRESHOLD AMPLITUDE: 0.5 V
MDC IDC MSMT LEADCHNL LV PACING THRESHOLD PULSEWIDTH: 0.4 ms
MDC IDC MSMT LEADCHNL RA IMPEDANCE VALUE: 399 Ohm
MDC IDC MSMT LEADCHNL RA PACING THRESHOLD PULSEWIDTH: 0.4 ms
MDC IDC MSMT LEADCHNL RV PACING THRESHOLD AMPLITUDE: 1 V
MDC IDC MSMT LEADCHNL RV PACING THRESHOLD PULSEWIDTH: 0.4 ms
MDC IDC MSMT LEADCHNL RV SENSING INTR AMPL: 26.25 mV
MDC IDC SESS DTM: 20170117215153
MDC IDC SET LEADCHNL LV PACING AMPLITUDE: 2 V
MDC IDC SET LEADCHNL LV PACING PULSEWIDTH: 0.4 ms
MDC IDC SET LEADCHNL RV PACING AMPLITUDE: 2 V
MDC IDC STAT BRADY AP VS PERCENT: 0.09 %
MDC IDC STAT BRADY AS VS PERCENT: 2.38 %

## 2015-11-05 DIAGNOSIS — G4733 Obstructive sleep apnea (adult) (pediatric): Secondary | ICD-10-CM | POA: Diagnosis not present

## 2015-11-07 ENCOUNTER — Other Ambulatory Visit: Payer: Self-pay

## 2015-11-07 ENCOUNTER — Other Ambulatory Visit (INDEPENDENT_AMBULATORY_CARE_PROVIDER_SITE_OTHER): Payer: PPO

## 2015-11-07 ENCOUNTER — Other Ambulatory Visit: Payer: Self-pay | Admitting: Internal Medicine

## 2015-11-07 ENCOUNTER — Ambulatory Visit (HOSPITAL_COMMUNITY): Payer: PPO | Attending: Cardiology

## 2015-11-07 DIAGNOSIS — I517 Cardiomegaly: Secondary | ICD-10-CM | POA: Diagnosis not present

## 2015-11-07 DIAGNOSIS — I255 Ischemic cardiomyopathy: Secondary | ICD-10-CM | POA: Diagnosis not present

## 2015-11-07 DIAGNOSIS — I5022 Chronic systolic (congestive) heart failure: Secondary | ICD-10-CM | POA: Insufficient documentation

## 2015-11-07 DIAGNOSIS — I340 Nonrheumatic mitral (valve) insufficiency: Secondary | ICD-10-CM | POA: Insufficient documentation

## 2015-11-07 LAB — BASIC METABOLIC PANEL
BUN: 47 mg/dL — ABNORMAL HIGH (ref 7–25)
CALCIUM: 9 mg/dL (ref 8.6–10.4)
CO2: 30 mmol/L (ref 20–31)
CREATININE: 1.44 mg/dL — AB (ref 0.50–0.99)
Chloride: 103 mmol/L (ref 98–110)
GLUCOSE: 106 mg/dL — AB (ref 65–99)
Potassium: 4.7 mmol/L (ref 3.5–5.3)
SODIUM: 141 mmol/L (ref 135–146)

## 2015-11-10 NOTE — Telephone Encounter (Signed)
Attempted ICM intro call and no answer.  Unable to reach patient for ICM enrollment at this time.   Message to Dr Caryl Comes and Dr Fletcher Anon to refer back to [facility] clinic to check device fluid levels at next physician visit if needed.      FYI:  Dr Fletcher Anon and Dr Caryl Comes

## 2015-11-11 ENCOUNTER — Emergency Department: Payer: PPO

## 2015-11-11 ENCOUNTER — Inpatient Hospital Stay: Payer: PPO

## 2015-11-11 ENCOUNTER — Inpatient Hospital Stay
Admission: EM | Admit: 2015-11-11 | Discharge: 2015-11-12 | DRG: 065 | Disposition: A | Discharge status: Home / Self Care | Payer: PPO | Attending: Internal Medicine | Admitting: Internal Medicine

## 2015-11-11 ENCOUNTER — Encounter: Payer: Self-pay | Admitting: Emergency Medicine

## 2015-11-11 DIAGNOSIS — Z9851 Tubal ligation status: Secondary | ICD-10-CM

## 2015-11-11 DIAGNOSIS — I6523 Occlusion and stenosis of bilateral carotid arteries: Secondary | ICD-10-CM | POA: Diagnosis not present

## 2015-11-11 DIAGNOSIS — Z794 Long term (current) use of insulin: Secondary | ICD-10-CM | POA: Diagnosis not present

## 2015-11-11 DIAGNOSIS — Z89421 Acquired absence of other right toe(s): Secondary | ICD-10-CM | POA: Diagnosis not present

## 2015-11-11 DIAGNOSIS — Z950 Presence of cardiac pacemaker: Secondary | ICD-10-CM

## 2015-11-11 DIAGNOSIS — I5022 Chronic systolic (congestive) heart failure: Secondary | ICD-10-CM | POA: Diagnosis present

## 2015-11-11 DIAGNOSIS — Z66 Do not resuscitate: Secondary | ICD-10-CM | POA: Diagnosis not present

## 2015-11-11 DIAGNOSIS — Z8042 Family history of malignant neoplasm of prostate: Secondary | ICD-10-CM | POA: Diagnosis not present

## 2015-11-11 DIAGNOSIS — G4733 Obstructive sleep apnea (adult) (pediatric): Secondary | ICD-10-CM | POA: Diagnosis not present

## 2015-11-11 DIAGNOSIS — R202 Paresthesia of skin: Secondary | ICD-10-CM | POA: Diagnosis not present

## 2015-11-11 DIAGNOSIS — Z8249 Family history of ischemic heart disease and other diseases of the circulatory system: Secondary | ICD-10-CM

## 2015-11-11 DIAGNOSIS — I110 Hypertensive heart disease with heart failure: Secondary | ICD-10-CM | POA: Diagnosis not present

## 2015-11-11 DIAGNOSIS — Z8673 Personal history of transient ischemic attack (TIA), and cerebral infarction without residual deficits: Secondary | ICD-10-CM | POA: Diagnosis present

## 2015-11-11 DIAGNOSIS — E0842 Diabetes mellitus due to underlying condition with diabetic polyneuropathy: Secondary | ICD-10-CM | POA: Diagnosis not present

## 2015-11-11 DIAGNOSIS — Z7982 Long term (current) use of aspirin: Secondary | ICD-10-CM

## 2015-11-11 DIAGNOSIS — Z833 Family history of diabetes mellitus: Secondary | ICD-10-CM | POA: Diagnosis not present

## 2015-11-11 DIAGNOSIS — G629 Polyneuropathy, unspecified: Secondary | ICD-10-CM | POA: Diagnosis not present

## 2015-11-11 DIAGNOSIS — G459 Transient cerebral ischemic attack, unspecified: Secondary | ICD-10-CM | POA: Diagnosis present

## 2015-11-11 DIAGNOSIS — Z8261 Family history of arthritis: Secondary | ICD-10-CM

## 2015-11-11 DIAGNOSIS — Z9049 Acquired absence of other specified parts of digestive tract: Secondary | ICD-10-CM | POA: Diagnosis not present

## 2015-11-11 DIAGNOSIS — E669 Obesity, unspecified: Secondary | ICD-10-CM | POA: Diagnosis not present

## 2015-11-11 DIAGNOSIS — E119 Type 2 diabetes mellitus without complications: Secondary | ICD-10-CM | POA: Diagnosis not present

## 2015-11-11 DIAGNOSIS — I2510 Atherosclerotic heart disease of native coronary artery without angina pectoris: Secondary | ICD-10-CM | POA: Diagnosis not present

## 2015-11-11 DIAGNOSIS — Z90710 Acquired absence of both cervix and uterus: Secondary | ICD-10-CM | POA: Diagnosis not present

## 2015-11-11 DIAGNOSIS — E042 Nontoxic multinodular goiter: Secondary | ICD-10-CM | POA: Diagnosis not present

## 2015-11-11 DIAGNOSIS — Z79899 Other long term (current) drug therapy: Secondary | ICD-10-CM

## 2015-11-11 DIAGNOSIS — I639 Cerebral infarction, unspecified: Secondary | ICD-10-CM | POA: Diagnosis not present

## 2015-11-11 DIAGNOSIS — I252 Old myocardial infarction: Secondary | ICD-10-CM | POA: Diagnosis not present

## 2015-11-11 DIAGNOSIS — R29702 NIHSS score 2: Secondary | ICD-10-CM | POA: Diagnosis not present

## 2015-11-11 DIAGNOSIS — Z807 Family history of other malignant neoplasms of lymphoid, hematopoietic and related tissues: Secondary | ICD-10-CM

## 2015-11-11 DIAGNOSIS — Z9889 Other specified postprocedural states: Secondary | ICD-10-CM | POA: Diagnosis not present

## 2015-11-11 DIAGNOSIS — Z955 Presence of coronary angioplasty implant and graft: Secondary | ICD-10-CM

## 2015-11-11 HISTORY — DX: Acute myocardial infarction, unspecified: I21.9

## 2015-11-11 LAB — COMPREHENSIVE METABOLIC PANEL
ALBUMIN: 4.1 g/dL (ref 3.5–5.0)
ALT: 26 U/L (ref 14–54)
ANION GAP: 8 (ref 5–15)
AST: 27 U/L (ref 15–41)
Alkaline Phosphatase: 90 U/L (ref 38–126)
BUN: 58 mg/dL — ABNORMAL HIGH (ref 6–20)
CHLORIDE: 105 mmol/L (ref 101–111)
CO2: 30 mmol/L (ref 22–32)
Calcium: 9.4 mg/dL (ref 8.9–10.3)
Creatinine, Ser: 1.36 mg/dL — ABNORMAL HIGH (ref 0.44–1.00)
GFR calc Af Amer: 48 mL/min — ABNORMAL LOW (ref >60)
GFR calc non Af Amer: 41 mL/min — ABNORMAL LOW (ref >60)
GLUCOSE: 139 mg/dL — AB (ref 65–99)
POTASSIUM: 4.5 mmol/L (ref 3.5–5.1)
SODIUM: 143 mmol/L (ref 135–145)
TOTAL PROTEIN: 8.3 g/dL — AB (ref 6.5–8.1)
Total Bilirubin: 0.5 mg/dL (ref 0.3–1.2)

## 2015-11-11 LAB — CBC
HCT: 40.4 % (ref 35.0–47.0)
Hemoglobin: 13.7 g/dL (ref 12.0–16.0)
MCH: 29.4 pg (ref 26.0–34.0)
MCHC: 34 g/dL (ref 32.0–36.0)
MCV: 86.6 fL (ref 80.0–100.0)
PLATELETS: 167 10*3/uL (ref 150–440)
RBC: 4.66 MIL/uL (ref 3.80–5.20)
RDW: 13.1 % (ref 11.5–14.5)
WBC: 6.4 10*3/uL (ref 3.6–11.0)

## 2015-11-11 LAB — GLUCOSE, CAPILLARY
GLUCOSE-CAPILLARY: 127 mg/dL — AB (ref 65–99)
GLUCOSE-CAPILLARY: 266 mg/dL — AB (ref 65–99)
Glucose-Capillary: 108 mg/dL — ABNORMAL HIGH (ref 65–99)
Glucose-Capillary: 98 mg/dL (ref 65–99)

## 2015-11-11 LAB — DIFFERENTIAL
BASOS PCT: 1 %
Basophils Absolute: 0 10*3/uL (ref 0–0.1)
EOS ABS: 0.1 10*3/uL (ref 0–0.7)
EOS PCT: 2 %
Lymphocytes Relative: 29 %
Lymphs Abs: 1.9 10*3/uL (ref 1.0–3.6)
Monocytes Absolute: 0.5 10*3/uL (ref 0.2–0.9)
Monocytes Relative: 8 %
NEUTROS PCT: 60 %
Neutro Abs: 3.9 10*3/uL (ref 1.4–6.5)

## 2015-11-11 LAB — TROPONIN I: Troponin I: <0.03 ng/mL (ref <0.031)

## 2015-11-11 LAB — LIPID PANEL
CHOLESTEROL: 187 mg/dL (ref 0–200)
HDL: 48 mg/dL (ref >40)
LDL Cholesterol: 120 mg/dL — ABNORMAL HIGH (ref 0–99)
TRIGLYCERIDES: 94 mg/dL (ref <150)
Total CHOL/HDL Ratio: 3.9 RATIO
VLDL: 19 mg/dL (ref 0–40)

## 2015-11-11 LAB — HEMOGLOBIN A1C: Hgb A1c MFr Bld: 7.5 % — ABNORMAL HIGH (ref 4.0–6.0)

## 2015-11-11 LAB — PROTIME-INR
INR: 0.96
PROTHROMBIN TIME: 13 s (ref 11.4–15.0)

## 2015-11-11 LAB — APTT: APTT: 37 s — AB (ref 24–36)

## 2015-11-11 MED ORDER — TORSEMIDE 20 MG PO TABS
20.0000 mg | ORAL_TABLET | Freq: Every day | ORAL | Status: DC
  Filled 2015-11-11 (×3): qty 1

## 2015-11-11 MED ORDER — NITROGLYCERIN 0.4 MG SL SUBL: 0.4000 mg | SUBLINGUAL_TABLET | SUBLINGUAL | Status: DC | PRN

## 2015-11-11 MED ORDER — GLIMEPIRIDE 2 MG PO TABS
4.0000 mg | ORAL_TABLET | Freq: Two times a day (BID) | ORAL | Status: DC
  Administered 2015-11-11 – 2015-11-12 (×2): 4 mg via ORAL
  Filled 2015-11-11 (×2): qty 2

## 2015-11-11 MED ORDER — CARVEDILOL 3.125 MG PO TABS
3.1250 mg | ORAL_TABLET | Freq: Two times a day (BID) | ORAL | Status: DC
  Administered 2015-11-11 – 2015-11-12 (×2): 3.125 mg via ORAL
  Filled 2015-11-11 (×2): qty 1

## 2015-11-11 MED ORDER — GABAPENTIN 600 MG PO TABS
300.0000 mg | ORAL_TABLET | Freq: Three times a day (TID) | ORAL | Status: DC
  Administered 2015-11-11 – 2015-11-12 (×3): 300 mg via ORAL
  Filled 2015-11-11: qty 2
  Filled 2015-11-11 (×2): qty 1

## 2015-11-11 MED ORDER — SODIUM CHLORIDE 0.9% FLUSH
3.0000 mL | Freq: Two times a day (BID) | INTRAVENOUS | Status: DC
  Administered 2015-11-11 – 2015-11-12 (×3): 3 mL via INTRAVENOUS

## 2015-11-11 MED ORDER — INSULIN ASPART 100 UNIT/ML ~~LOC~~ SOLN
0.0000 [IU] | Freq: Three times a day (TID) | SUBCUTANEOUS | Status: DC
  Administered 2015-11-11: 5 [IU] via SUBCUTANEOUS
  Filled 2015-11-11 (×2): qty 5

## 2015-11-11 MED ORDER — TRAMADOL HCL 50 MG PO TABS
50.0000 mg | ORAL_TABLET | Freq: Four times a day (QID) | ORAL | Status: DC | PRN
  Administered 2015-11-12: 50 mg via ORAL
  Filled 2015-11-11: qty 1

## 2015-11-11 MED ORDER — ASPIRIN 81 MG PO CHEW
324.0000 mg | CHEWABLE_TABLET | Freq: Once | ORAL | Status: AC
  Administered 2015-11-11: 324 mg via ORAL
  Filled 2015-11-11: qty 4

## 2015-11-11 MED ORDER — INSULIN ASPART 100 UNIT/ML ~~LOC~~ SOLN: 0.0000 [IU] | Freq: Every day | SUBCUTANEOUS | Status: DC

## 2015-11-11 MED ORDER — ADULT MULTIVITAMIN W/MINERALS CH
ORAL_TABLET | ORAL | Status: DC
  Administered 2015-11-12: 06:00:00 1 via ORAL
  Filled 2015-11-11: qty 1

## 2015-11-11 MED ORDER — INSULIN GLARGINE 100 UNIT/ML ~~LOC~~ SOLN
10.0000 [IU] | Freq: Every day | SUBCUTANEOUS | Status: DC
  Administered 2015-11-11: 10 [IU] via SUBCUTANEOUS
  Filled 2015-11-11 (×2): qty 0.1

## 2015-11-11 MED ORDER — CLOPIDOGREL BISULFATE 75 MG PO TABS
75.0000 mg | ORAL_TABLET | Freq: Every day | ORAL | Status: DC
  Administered 2015-11-12: 75 mg via ORAL
  Filled 2015-11-11: qty 1

## 2015-11-11 MED ORDER — OMEGA-3-ACID ETHYL ESTERS 1 G PO CAPS
1.0000 g | ORAL_CAPSULE | Freq: Every day | ORAL | Status: DC
  Administered 2015-11-11 – 2015-11-12 (×2): 1 g via ORAL
  Filled 2015-11-11 (×2): qty 1

## 2015-11-11 MED ORDER — HEPARIN SODIUM (PORCINE) 5000 UNIT/ML IJ SOLN
5000.0000 [IU] | Freq: Three times a day (TID) | INTRAMUSCULAR | Status: DC
  Administered 2015-11-11 – 2015-11-12 (×3): 5000 [IU] via SUBCUTANEOUS
  Filled 2015-11-11 (×4): qty 1

## 2015-11-11 MED ORDER — ASPIRIN EC 81 MG PO TBEC
81.0000 mg | DELAYED_RELEASE_TABLET | Freq: Every day | ORAL | Status: DC
  Administered 2015-11-12: 09:00:00 81 mg via ORAL
  Filled 2015-11-11: qty 1

## 2015-11-11 MED ORDER — PRAVASTATIN SODIUM 40 MG PO TABS
40.0000 mg | ORAL_TABLET | Freq: Every day | ORAL | Status: DC
  Administered 2015-11-11: 40 mg via ORAL
  Filled 2015-11-11: qty 1

## 2015-11-11 MED ORDER — ISOSORBIDE MONONITRATE ER 30 MG PO TB24
30.0000 mg | ORAL_TABLET | Freq: Every day | ORAL | Status: DC
  Administered 2015-11-12: 09:00:00 30 mg via ORAL
  Filled 2015-11-11: qty 1

## 2015-11-11 NOTE — Progress Notes (Signed)
Chaplain received a code stroke and met with family to provide a compassionate presence.[name] [phone]

## 2015-11-11 NOTE — Consult Note (Signed)
Referring Physician: Anselm Jungling    Chief Complaint: Left sided weakness  HPI: [name] is an 60 y.o. female who reports going to bed this morning and being at baseline.  Woke up later and noted that she was having trouble using her left leg but went back to bed.  Woke up again later and noticed the same problem.  She finally got out of bed for the day later in the morning and noted that her left leg was weak.  She then noted that her left arm was numb and with breakfast noted that the left side of her face was numb.  Patient was brought in for evaluation at that time. Code stroke was initiated.  Patient was not deemed a tPA candidate.  Initial NIHSS of 2.  Date last known well: 11/11/2015 Time last known well: Time: 01:00 tPA Given: No: Outside time window  Past Medical History  Diagnosis Date  . Obesity, unspecified   . Unspecified vitamin D deficiency   . Pure hypercholesterolemia   . Neuropathy ([place]) 2011  . Chicken pox   . Chronic systolic CHF (congestive heart failure) (HCC)     a. mixed ICM & NICM; b. EF 20-25% by echo 07/2012, mid-dist 2/3 of LV sev HK/AK, mild MR. echo 10/2012: EF 30-35%, sev HK ant-septal & inf walls, GR1DD, mild MR, PASP 33. c. echo 02/2013: EF 30%, GR1DD, mild MR. echo 04/2014: EF 30%, Septal-lat dyssynchrony, global HK, inf AK, GR1DD, mild MR. d. echo 10/2014: EF50-55%, WM nl, GR1DD, septal mild paradox. e. echo 02/2015: EF 50-55%, wm   . Hypertension   . Anemia   . Automatic implantable cardioverter-defibrillator in situ     a. MDT CRT-D 06/2014, [id number]    . Type II diabetes mellitus ([place])   . CAD (coronary artery disease)     a. cardiac cath 101/04/2012: PCI/DES to chronically occluded mLAD, consideration PCI to diag branch in 4 weeks.   Marland Kitchen History of blood transfusion ~ 2011    "plasma; had neuropathy; couldn't walk"  . OSA on CPAP     Moderate with AHI 23/hr and now on CPAP at 16cm H2O.  Her DME is [facility]  . LBBB (left bundle branch block)   . Heart  attack [facility])     Past Surgical History  Procedure Laterality Date  . Lumbar disc surgery  1996    L4-5  . Incision and drainage abscess Right 2007    groin; with ICU stay due to sepsis.  . Bilateral oophorectomy  01/2011    ovarian cyst benign  . Bi-ventricular implantable cardioverter defibrillator  (crt-d)  07/06/2014  . Laparoscopic cholecystectomy  2011  . Back surgery    . Coronary angioplasty with stent placement Left 07/2012    new onset systolic CHF; elevated troponins.  Cardiac catheterization with stenting to LAD; EF 15%.  2D-echo: EF 20-25%.  . Vaginal hysterectomy  01/2011    Fibroids/DUB.  Ovaries removed. [place].  . Tubal ligation  1981  . Left heart catheterization with coronary angiogram N/A 07/21/2012    Procedure: LEFT HEART CATHETERIZATION WITH CORONARY ANGIOGRAM;  Surgeon: Jolaine Artist, MD;  Location[facility] CATH LAB;  Service: Cardiovascular;  Laterality: N/A;  . Percutaneous coronary stent intervention (pci-s) N/A 07/23/2012    Procedure: PERCUTANEOUS CORONARY STENT INTERVENTION (PCI-S);  Surgeon: Sherren Mocha, MD;  Location: [facility] CATH LAB;  Service: Cardiovascular;  Laterality: N/A;  . Bi-ventricular implantable cardioverter defibrillator N/A 07/06/2014    Procedure: BI-VENTRICULAR IMPLANTABLE CARDIOVERTER  DEFIBRILLATOR  (CRT-D);  Surgeon: Deboraha Sprang, MD;  Location: [facility] CATH LAB;  Service: Cardiovascular;  Laterality: N/A;  . Colonoscopy with propofol Left 02/22/2015    Procedure: COLONOSCOPY WITH PROPOFOL;  Surgeon: Hulen Luster, MD;  Location: [facility] ENDOSCOPY;  Service: Endoscopy;  Laterality: Left;  . Esophagogastroduodenoscopy N/A 02/22/2015    Procedure: ESOPHAGOGASTRODUODENOSCOPY (EGD);  Surgeon: Hulen Luster, MD;  Location: [facility] ENDOSCOPY;  Service: Endoscopy;  Laterality: N/A;  . Peripheral vascular catheterization N/A 02/10/2015    Procedure: Picc Line Insertion;  Surgeon: Katha Cabal, MD;  Location: [place] CV LAB;  Service: Cardiovascular;   Laterality: N/A;  . Amputation toe Right 06/18/2015    Procedure: AMPUTATION TOE;  Surgeon: Samara Deist, DPM;  Location: [facility] ORS;  Service: Podiatry;  Laterality: Right;    Family History  Problem Relation Age of Onset  . Diabetes Mother   . Hypertension Mother   . Arthritis Mother     knees, lumbar DDD, cervical DDD  . Cancer Father     prostate,skin,lymphoma.  . Obesity Brother   . Diabetes Maternal Grandmother   . Diabetes Paternal Grandmother    Social History:  reports that she has never smoked. She has never used smokeless tobacco. She reports that she does not drink alcohol or use illicit drugs.  Allergies: No Known Allergies  Medications: I have reviewed the patient's current medications. Prior to Admission:  Prior to Admission medications   Medication Sig Start Date End Date Taking? Authorizing Provider  aspirin EC 81 MG tablet Take 1 tablet (81 mg total) by mouth daily. 03/31/14  Yes Wardell Honour, MD  Calcium Carb-Cholecalciferol (CALCIUM 600 + D PO) Take 1 tablet by mouth 2 (two) times daily.   Yes Historical Provider, MD  carvedilol (COREG) 3.125 MG tablet Take 1 tablet (3.125 mg total) by mouth 2 (two) times daily. 07/27/15  Yes Wellington Hampshire, MD  gabapentin (NEURONTIN) 600 MG tablet TAKE 1 TABLET BY MOUTH 3 TIMES DAILY 08/20/15  Yes Wardell Honour, MD  glimepiride (AMARYL) 4 MG tablet Take 1 tablet (4 mg total) by mouth 2 (two) times daily with a meal. 08/22/15  Yes Wardell Honour, MD  Insulin Glargine (LANTUS) 100 UNIT/ML Solostar Pen Inject 10 Units into the skin daily at 10 pm. Patient taking differently: Inject 12-17 Units into the skin at bedtime.  04/27/15  Yes Wardell Honour, MD  isosorbide mononitrate (IMDUR) 30 MG 24 hr tablet Take 1 tablet (30 mg total) by mouth daily. 07/27/15  Yes Wellington Hampshire, MD  MULTIPLE VITAMIN PO Take 1 tablet by mouth every morning.    Yes Historical Provider, MD  nitroGLYCERIN (NITROSTAT) 0.4 MG SL tablet Place 0.4 mg under  the tongue every 5 (five) minutes as needed for chest pain.   Yes Historical Provider, MD  Omega-3 Fatty Acids (FISH OIL) 1000 MG CAPS Take 1,000 mg by mouth 2 (two) times daily.    Yes Historical Provider, MD  torsemide (DEMADEX) 20 MG tablet Take 1 tablet (20 mg total) by mouth daily. 09/22/15  Yes Wellington Hampshire, MD  traMADol (ULTRAM) 50 MG tablet TAKE 1 TABLET EVERY 6 HOURS AS NEEDED FOR PAIN 07/31/15  Yes Wardell Honour, MD  pravastatin (PRAVACHOL) 40 MG tablet Take 1 tablet (40 mg total) by mouth at bedtime. 03/31/14   Wardell Honour, MD    ROS: History obtained from the patient  General ROS: negative for - chills, fatigue, fever, night sweats,  weight gain or weight loss Psychological ROS: negative for - behavioral disorder, hallucinations, memory difficulties, mood swings or suicidal ideation Ophthalmic ROS: negative for - blurry vision, double vision, eye pain or loss of vision ENT ROS: negative for - epistaxis, nasal discharge, oral lesions, sore throat, tinnitus or vertigo Allergy and Immunology ROS: negative for - hives or itchy/watery eyes Hematological and Lymphatic ROS: negative for - bleeding problems, bruising or swollen lymph nodes Endocrine ROS: negative for - galactorrhea, hair pattern changes, polydipsia/polyuria or temperature intolerance Respiratory ROS: negative for - cough, hemoptysis, shortness of breath or wheezing Cardiovascular ROS: negative for - chest pain, dyspnea on exertion, edema or irregular heartbeat Gastrointestinal ROS: negative for - abdominal pain, diarrhea, hematemesis, nausea/vomiting or stool incontinence Genito-Urinary ROS: negative for - dysuria, hematuria, incontinence or urinary frequency/urgency Musculoskeletal ROS: left hand clawing Neurological ROS: as noted in HPI Dermatological ROS: negative for rash and skin lesion changes  Physical Examination: Blood pressure 122/64, pulse 72, temperature 98 F (36.7 C), resp. rate 13, height 5\' 8"   (1.727 m), weight 113.399 kg (250 lb), SpO2 98 %.  HEENT-  Normocephalic, no lesions, without obvious abnormality.  Normal external eye and conjunctiva.  Normal TM's bilaterally.  Normal auditory canals and external ears. Normal external nose, mucus membranes and septum.  Normal pharynx. Cardiovascular- S1, S2 normal, pulses palpable throughout   Lungs- chest clear, no wheezing, rales, normal symmetric air entry Abdomen- soft, non-tender; bowel sounds normal; no masses,  no organomegaly Extremities- BLE  edema Lymph-no adenopathy palpable Musculoskeletal-no joint tenderness, deformity or swelling Skin-warm and dry, no hyperpigmentation, vitiligo, or suspicious lesions  Neurological Examination Mental Status: Alert, oriented, thought content appropriate.  Speech fluent without evidence of aphasia.  Able to follow 3 step commands without difficulty. Cranial Nerves: II: Discs flat bilaterally; Visual fields grossly normal, pupils equal, round, reactive to light and accommodation III,IV, VI: ptosis not present, extra-ocular motions intact bilaterally V,VII: smile symmetric, facial light touch sensation decreased on the left VIII: hearing normal bilaterally IX,X: gag reflex present XI: bilateral shoulder shrug XII: midline tongue extension Motor: Right : Upper extremity   5/5    Left:     Upper extremity   5/5. Clawing of the last three fingers on the left hand  Lower extremity   5/5     Lower extremity   5-/5 Tone and bulk:normal tone throughout; no atrophy noted Sensory: Pinprick and light touch decreased in the left upper and lower extremity Deep Tendon Reflexes: 2+ in the upper extremities, trace at the knees and absent at the ankles Plantars: Right: mute   Left: mute Cerebellar: Normal finger-to-nose and normal heel-to-shin testing bilaterally Gait: not tested due to safety concerns     Laboratory Studies:  Basic Metabolic Panel:  Recent Labs Lab 11/07/15 1454 11/11/15 0951   NA 141 143  K 4.7 4.5  CL 103 105  CO2 30 30  GLUCOSE 106* 139*  BUN 47* 58*  CREATININE 1.44* 1.36*  CALCIUM 9.0 9.4    Liver Function Tests:  Recent Labs Lab 11/11/15 0951  AST 27  ALT 26  ALKPHOS 90  BILITOT 0.5  PROT 8.3*  ALBUMIN 4.1   No results for input(s): LIPASE, AMYLASE in the last 168 hours. No results for input(s): AMMONIA in the last 168 hours.  CBC:  Recent Labs Lab 11/11/15 0951  WBC 6.4  NEUTROABS 3.9  HGB 13.7  HCT 40.4  MCV 86.6  PLT 167    Cardiac Enzymes:  Recent Labs  Lab 11/11/15 0951  TROPONINI <0.03    BNP: Invalid input(s): POCBNP  CBG:  Recent Labs Lab 11/11/15 0948  GLUCAP 108*    Microbiology: Results for orders placed or performed during the hospital encounter of 06/16/15  Wound culture     Status: None   Collection Time: 06/18/15 12:15 AM  Result Value Ref Range Status   Specimen Description TOE  Final   Special Requests Immunocompromised  Final   Gram Stain   Final    RARE WBC SEEN FEW GRAM POSITIVE COCCI IN TETRADS IN PAIRS FEW GRAM NEGATIVE RODS    Culture   Final    MODERATE GROWTH METHICILLIN RESISTANT STAPHYLOCOCCUS AUREUS CRITICAL RESULT CALLED TO, READ BACK BY AND VERIFIED WITH: LAURIE WOODS 06/20/15 1157 JGF    Report Status 06/20/2015 FINAL  Final   Organism ID, Bacteria METHICILLIN RESISTANT STAPHYLOCOCCUS AUREUS  Final      Susceptibility   Methicillin resistant staphylococcus aureus - MIC*    CIPROFLOXACIN >=8 RESISTANT Resistant     ERYTHROMYCIN >=8 RESISTANT Resistant     GENTAMICIN <=0.5 SENSITIVE Sensitive     OXACILLIN >=4 RESISTANT Resistant     VANCOMYCIN 1 SENSITIVE Sensitive     TRIMETH/SULFA <=10 SENSITIVE Sensitive     CLINDAMYCIN >=8 RESISTANT Resistant     CEFOXITIN SCREEN Value in next row Resistant      POSITIVECEFOXITIN SCREEN - This test may be used to predict mecA-mediated oxacillin resistance, and it is based on the cefoxitin disk screen test.  The cefoxitin screen and  oxacillin work in combination to determine the final interpretation reported for oxacillin.     Inducible Clindamycin Value in next row Sensitive      POSITIVECEFOXITIN SCREEN - This test may be used to predict mecA-mediated oxacillin resistance, and it is based on the cefoxitin disk screen test.  The cefoxitin screen and oxacillin work in combination to determine the final interpretation reported for oxacillin.     * MODERATE GROWTH METHICILLIN RESISTANT STAPHYLOCOCCUS AUREUS    Coagulation Studies:  Recent Labs  11/11/15 0951  LABPROT 13.0  INR 0.96    Urinalysis: No results for input(s): COLORURINE, LABSPEC, PHURINE, GLUCOSEU, HGBUR, BILIRUBINUR, KETONESUR, PROTEINUR, UROBILINOGEN, NITRITE, LEUKOCYTESUR in the last 168 hours.  Invalid input(s): APPERANCEUR  Lipid Panel:    Component Value Date/Time   CHOL 166 06/26/2015 1655   TRIG 125 06/26/2015 1655   HDL 37* 06/26/2015 1655   CHOLHDL 4.5 06/26/2015 1655   VLDL 25 06/26/2015 1655   LDLCALC 104 06/26/2015 1655    HgbA1C:  Lab Results  Component Value Date   HGBA1C 8.2* 06/16/2015    Urine Drug Screen:  No results found for: LABOPIA, COCAINSCRNUR, LABBENZ, AMPHETMU, THCU, LABBARB  Alcohol Level: No results for input(s): ETH in the last 168 hours.  Other results: EKG: sinus rhythm at 75 bpm.  Imaging: Ct Head Wo Contrast  11/11/2015  CLINICAL DATA:  Acute left-sided weakness and numbness. EXAM: CT HEAD WITHOUT CONTRAST TECHNIQUE: Contiguous axial images were obtained from the base of the skull through the vertex without intravenous contrast. COMPARISON:  MRI and CT scan of May 17, 2010. FINDINGS: Bony calvarium appears intact. Small rounded focal low density is noted in right centrum semiovale concerning for focal infarction of indeterminate age. No hemorrhage or mass lesion is noted. No mass effect or midline shift is noted. Ventricular size is within normal limits. IMPRESSION: Small rounded focal low density seen in  right centrum semiovale concerning for focal infarction  of indeterminate age. These results were called by telephone at the time of interpretation on 11/11/2015 at 9:49 am to Dr. Lavonia Drafts , who verbally acknowledged these results. Electronically Signed   By: Marijo Conception, M.D.   On: 11/11/2015 09:49    Assessment: 60 y.o. female presenting with left sided numbness and mild LLE weakness.  Although patient with some premorbid left sided symptoms, current symptoms are not her baseline.  Head CT personally reviewed and shows no acute changes but an area of hypodensity in the right centrum semiovale.  MRI of the brain unable to be performed due to pacemaker.  Do suspect acute infarct due to small vessel disease.  Patient on ASA at home.  Had a echocardiogram on 1/31 that showed a EF of 50% and some diffuse hypokinesis but no cardiac sources for emboli.    Stroke Risk Factors - diabetes mellitus, hyperlipidemia, hypertension and sleep apnea  Plan: 1. HgbA1c, fasting lipid panel 2. Frequent neuro checks 3. PT consult, OT consult, Speech consult 4. Telemetry monitoring 5. Carotid dopplers 6. Prophylactic therapy-Antiplatelet med: Plavix - dose 75mg  daily 7. NPO until RN stroke swallow screen   Alexis Goodell, MD Neurology [phone] 11/11/2015, 1:48 PM

## 2015-11-11 NOTE — ED Provider Notes (Signed)
[facility] Emergency Department Provider Note   ____________________________________________  Time seen: Upon ED arrival I have reviewed the triage vital signs and the triage nursing note.  HISTORY  Chief Complaint Extremity Weakness and Code Stroke   Historian Patient  HPI [name] is a 60 y.o. female with a history of CHF and coronary artery disease with a implanted pacer/defibrillator, is here for complaint of left leg weakness and left face, left arm, and left leg numbness.Patient states she went to bed around 1 AM and this is when her last known normal time was. She woke up this morning and noticed that upon waking she had left leg weakness and a little trouble walking due to that. As she was up and moving around she also noticed that her left arm felt funny, numb and tingly, and then noticed that her face also felt numb and tingly on the left side. No prior history of stroke. She takes baby aspirin daily. No recent Trauma. Symptoms are moderate.    Past Medical History  Diagnosis Date  . Obesity, unspecified   . Unspecified vitamin D deficiency   . Pure hypercholesterolemia   . Neuropathy ([place]) 2011  . Chicken pox   . Chronic systolic CHF (congestive heart failure) (HCC)     a. mixed ICM & NICM; b. EF 20-25% by echo 07/2012, mid-dist 2/3 of LV sev HK/AK, mild MR. echo 10/2012: EF 30-35%, sev HK ant-septal & inf walls, GR1DD, mild MR, PASP 33. c. echo 02/2013: EF 30%, GR1DD, mild MR. echo 04/2014: EF 30%, Septal-lat dyssynchrony, global HK, inf AK, GR1DD, mild MR. d. echo 10/2014: EF50-55%, WM nl, GR1DD, septal mild paradox. e. echo 02/2015: EF 50-55%, wm   . Hypertension   . Anemia   . Automatic implantable cardioverter-defibrillator in situ     a. MDT CRT-D 06/2014, [id number] H    . Type II diabetes mellitus ([place])   . CAD (coronary artery disease)     a. cardiac cath 101/04/2012: PCI/DES to chronically occluded mLAD, consideration PCI to diag  branch in 4 weeks.   Marland Kitchen History of blood transfusion ~ 2011    "plasma; had neuropathy; couldn't walk"  . OSA on CPAP     Moderate with AHI 23/hr and now on CPAP at 16cm H2O.  Her DME is [facility]  . LBBB (left bundle branch block)     Patient Active Problem List   Diagnosis Date Noted  . Chronic renal insufficiency 08/04/2015  . Heel ulcer ([place]) 04/27/2015  . Diabetes mellitus, type 2 ([place]) 04/27/2015  . GIB (gastrointestinal bleeding) 02/17/2015  . Acute osteomyelitis involving ankle and foot ([place]) 02/06/2015  . OSA (obstructive sleep apnea) 08/15/2014  . Chronic left ventricular systolic dysfunction 0000000  . Morbid obesity ([place]) 10/26/2013  . Diarrhea 04/29/2013  . Essential hypertension, benign 04/29/2013  . Renal insufficiency 04/19/2013  . Pure hypercholesterolemia 12/07/2012  . Iron deficiency anemia 12/07/2012  . Diabetic peripheral neuropathy associated with type 2 diabetes mellitus ([place]) 12/07/2012  . Chronic systolic heart failure ([place]) 07/29/2012  . Left bundle branch block 07/25/2012  . CAD (coronary artery disease) 07/25/2012  . Acute CHF (congestive heart failure) ([place]) 07/21/2012  . Hypoxia 07/21/2012  . HLD (hyperlipidemia) 07/21/2012  . Acute systolic heart failure ([place]) 07/21/2012  . Acute myocardial infarction, subendocardial infarction, initial episode of care [facility]) 07/21/2012    Past Surgical History  Procedure Laterality Date  . Lumbar disc surgery  1996    L4-5  .  Incision and drainage abscess Right 2007    groin; with ICU stay due to sepsis.  . Bilateral oophorectomy  01/2011    ovarian cyst benign  . Bi-ventricular implantable cardioverter defibrillator  (crt-d)  07/06/2014  . Laparoscopic cholecystectomy  2011  . Back surgery    . Coronary angioplasty with stent placement Left 07/2012    new onset systolic CHF; elevated troponins.  Cardiac catheterization with stenting to LAD; EF 15%.  2D-echo: EF 20-25%.  . Vaginal hysterectomy  01/2011     Fibroids/DUB.  Ovaries removed. [place].  . Tubal ligation  1981  . Left heart catheterization with coronary angiogram N/A 07/21/2012    Procedure: LEFT HEART CATHETERIZATION WITH CORONARY ANGIOGRAM;  Surgeon: Jolaine Artist, MD;  Location: [facility] CATH LAB;  Service: Cardiovascular;  Laterality: N/A;  . Percutaneous coronary stent intervention (pci-s) N/A 07/23/2012    Procedure: PERCUTANEOUS CORONARY STENT INTERVENTION (PCI-S);  Surgeon: Sherren Mocha, MD;  Location[facility] CATH LAB;  Service: Cardiovascular;  Laterality: N/A;  . Bi-ventricular implantable cardioverter defibrillator N/A 07/06/2014    Procedure: BI-VENTRICULAR IMPLANTABLE CARDIOVERTER DEFIBRILLATOR  (CRT-D);  Surgeon: Deboraha Sprang, MD;  [facility] CATH LAB;  Service: Cardiovascular;  Laterality: N/A;  . Colonoscopy with propofol Left 02/22/2015    Procedure: COLONOSCOPY WITH PROPOFOL;  Surgeon: Hulen Luster, MD;  Location: [facility] ENDOSCOPY;  Service: Endoscopy;  Laterality: Left;  . Esophagogastroduodenoscopy N/A 02/22/2015    Procedure: ESOPHAGOGASTRODUODENOSCOPY (EGD);  Surgeon: Hulen Luster, MD;  Location: [facility] ENDOSCOPY;  Service: Endoscopy;  Laterality: N/A;  . Peripheral vascular catheterization N/A 02/10/2015    Procedure: Picc Line Insertion;  Surgeon: Katha Cabal, MD;  Location: [place] CV LAB;  Service: Cardiovascular;  Laterality: N/A;  . Amputation toe Right 06/18/2015    Procedure: AMPUTATION TOE;  Surgeon: Samara Deist, DPM;  Location: [facility] ORS;  Service: Podiatry;  Laterality: Right;    Current Outpatient Rx  Name  Route  Sig  Dispense  Refill  . aspirin EC 81 MG tablet   Oral   Take 1 tablet (81 mg total) by mouth daily.   90 tablet   3   . Calcium Carbonate (CALCIUM 600 PO)   Oral   Take by mouth 2 (two) times daily.         . carvedilol (COREG) 3.125 MG tablet   Oral   Take 1 tablet (3.125 mg total) by mouth 2 (two) times daily.   60 tablet   6   . gabapentin (NEURONTIN) 600 MG tablet       TAKE 1 TABLET BY MOUTH 3 TIMES DAILY   270 tablet   0   . glimepiride (AMARYL) 4 MG tablet   Oral   Take 1 tablet (4 mg total) by mouth 2 (two) times daily with a meal.   30 tablet   3   . Insulin Glargine (LANTUS) 100 UNIT/ML Solostar Pen   Subcutaneous   Inject 10 Units into the skin daily at 10 pm.   15 mL   11   . isosorbide mononitrate (IMDUR) 30 MG 24 hr tablet   Oral   Take 1 tablet (30 mg total) by mouth daily.   30 tablet   6   . MULTIPLE VITAMIN PO   Oral   Take by mouth daily.         . nitroGLYCERIN (NITROSTAT) 0.4 MG SL tablet   Sublingual   Place 0.4 mg under the tongue every 5 (five) minutes as  needed for chest pain.         . Omega-3 Fatty Acids (FISH OIL) 1000 MG CAPS   Oral   Take 100 mg by mouth 2 (two) times daily.         . pravastatin (PRAVACHOL) 40 MG tablet   Oral   Take 1 tablet (40 mg total) by mouth at bedtime.   90 tablet   3   . torsemide (DEMADEX) 20 MG tablet   Oral   Take 1 tablet (20 mg total) by mouth daily.   30 tablet   11   . traMADol (ULTRAM) 50 MG tablet      TAKE 1 TABLET EVERY 6 HOURS AS NEEDED FOR PAIN   120 tablet   5     Allergies Review of patient's allergies indicates no known allergies.  Family History  Problem Relation Age of Onset  . Diabetes Mother   . Hypertension Mother   . Arthritis Mother     knees, lumbar DDD, cervical DDD  . Cancer Father     prostate,skin,lymphoma.  . Obesity Brother   . Diabetes Maternal Grandmother   . Diabetes Paternal Grandmother     Social History Social History  Substance Use Topics  . Smoking status: Never Smoker   . Smokeless tobacco: Never Used  . Alcohol Use: No    Review of Systems  Constitutional: Negative for fever. Eyes: Negative for visual changes. ENT: Negative for sore throat. Cardiovascular: Negative for chest pain. Respiratory: Negative for shortness of breath. Gastrointestinal: Negative for abdominal pain, vomiting and  diarrhea. Genitourinary: Negative for dysuria. Musculoskeletal: Negative for back pain. Skin: Negative for rash. Neurological: Negative for headache. 10 point Review of Systems otherwise negative ____________________________________________   PHYSICAL EXAM:  VITAL SIGNS: ED Triage Vitals  Enc Vitals Group     BP 11/11/15 0952 177/76 mmHg     Pulse Rate 11/11/15 0952 71     Resp 11/11/15 0952 16     Temp --      Temp src --      SpO2 11/11/15 0952 100 %     Weight 11/11/15 0952 250 lb (113.399 kg)     Height 11/11/15 0952 5\' 8"  (1.727 m)     Head Cir --      Peak Flow --      Pain Score --      Pain Loc --      Pain Edu? --      Excl. in [place]? --      Constitutional: Alert and oriented. Well appearing and in no distress. HEENT   Head: Normocephalic and atraumatic.      Eyes: Conjunctivae are normal. PERRL. Normal extraocular movements.      Ears:         Nose: No congestion/rhinnorhea.   Mouth/Throat: Mucous membranes are moist.   Neck: No stridor. Cardiovascular/Chest: Normal rate, regular rhythm.  No murmurs, rubs, or gallops. Respiratory: Normal respiratory effort without tachypnea nor retractions. Breath sounds are clear and equal bilaterally. No wheezes/rales/rhonchi. Gastrointestinal: Soft. No distention, no guarding, no rebound. Nontender.   Genitourinary/rectal:Deferred Musculoskeletal: Nontender with normal range of motion in all extremities. No joint effusions.  No lower extremity tenderness.  No edema. Neurologic:  Normal speech and language. No dysarthria or aphasia. Left leg drift, without touching the bed. Bilateral upper extremities and right lower extremity strength intact. Paresthesia left face, left arm, and left leg. No facial droop.  Finger to nose intact. No pronator drift.  No visual deficits. Skin:  Skin is warm, dry and intact. No rash noted. Psychiatric: Mood and affect are normal. Speech and behavior are normal. Patient exhibits appropriate  insight and judgment.  ____________________________________________   EKG I, Lisa Roca, MD, the attending physician have personally viewed and interpreted all ECGs.  75 bpm. Normal sinus rhythm. Nonspecific intraventricular conduction delay. Normal axis. Normal ST and T-wave ____________________________________________  LABS (pertinent positives/negatives)  INR 0.96 CBC within normal limits Comprehensive metabolic panel significant for BUN 58 and creatinine 1.36 Troponin less than 0.03  ____________________________________________  RADIOLOGY All Xrays were viewed by me. Imaging interpreted by Radiologist.  CT head:  IMPRESSION: Small rounded focal low density seen in right centrum semiovale concerning for focal infarction of indeterminate age. These results were called by telephone at the time of interpretation on 11/11/2015 at 9:49 am to Dr. Lavonia Drafts , who verbally acknowledged these results. __________________________________________  PROCEDURES  Procedure(s) performed: None  Critical Care performed: CRITICAL CARE Performed by: Lisa Roca   Total critical care time: 30 minutes  Critical care time was exclusive of separately billable procedures and treating other patients.  Critical care was necessary to treat or prevent imminent or life-threatening deterioration.  Critical care was time spent personally by me on the following activities: development of treatment plan with patient and/or surrogate as well as nursing, discussions with consultants, evaluation of patient's response to treatment, examination of patient, obtaining history from patient or surrogate, ordering and performing treatments and interventions, ordering and review of laboratory studies, ordering and review of radiographic studies, pulse oximetry and re-evaluation of patient's condition.     ____________________________________________   ED COURSE / ASSESSMENT AND PLAN  Pertinent  labs & imaging results that were available during my care of the patient were reviewed by me and considered in my medical decision making (see chart for details).  Symptoms of stroke clinically, and on exam NIH stroke score 2 for sensory changes and left leg drift.  However patient is not a TPA candidate due to time of onset unknown, last known well approximately 9-10 hours ago.  CT head read as infarct age indeterminate. I discussed this case with the specialist on-call for neurology  who does not recommend TPA.  He recommends hospital admission and workup. Additional recommendations to come by fax report.  Patient was given 4 baby aspirin. She did not have her baby aspirin today.    CONSULTATIONS:  Phone discussion with Tel[facility]ology consulation.  Hospitalist for admission.  Patient / Family / Caregiver informed of clinical course, medical decision-making process, and agree with plan.     ___________________________________________   FINAL CLINICAL IMPRESSION(S) / ED DIAGNOSES   Final diagnoses:  Cerebrovascular accident (CVA), unspecified mechanism ([place])              Note: This dictation was prepared with Sales executive. Any transcriptional errors that result from this process are unintentional   Lisa Roca, MD 11/11/15 1034

## 2015-11-11 NOTE — Plan of Care (Signed)
Problem: Physical Regulation: Goal: Ability to maintain clinical measurements within normal limits will improve Outcome: Progressing Pt admitted today from the ED. VSS. NIH score 1. No neuro changes since admission. Denies pain.

## 2015-11-11 NOTE — H&P (Addendum)
[facility] [place] NAME: [name]    MR#:  [id number]  DATE OF BIRTH:  [birth year]  DATE OF ADMISSION:  11/11/2015  PRIMARY CARE PHYSICIAN: [name], MD   REQUESTING/REFERRING PHYSICIAN: Lord  CHIEF COMPLAINT:   Chief Complaint  Patient presents with  . Extremity Weakness  . Code Stroke    HISTORY OF PRESENT ILLNESS: [name]  is a 60 y.o. 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 Today morning she woke up and tried to get out of the bed and she noticed her left lower extremity is weaker than her baseline, she could still walk. She also felt some numbness on her leg on the left side. Within a few hours she started noticing her left arm also numb and left side of her face was numb also. She spoke to her husband and they decided to bring her to emergency room. In ER CT scan of the head was having a small stroke, tele neurology consult was done, and he suggested to admit for further stroke workup and monitoring.  PAST MEDICAL HISTORY:   Past Medical History  Diagnosis Date  . Obesity, unspecified   . Unspecified vitamin D deficiency   . Pure hypercholesterolemia   . Neuropathy ([place]) 2011  . Chicken pox   . Chronic systolic CHF (congestive heart failure) (HCC)     a. mixed ICM & NICM; b. EF 20-25% by echo 07/2012, mid-dist 2/3 of LV sev HK/AK, mild MR. echo 10/2012: EF 30-35%, sev HK ant-septal & inf walls, GR1DD, mild MR, PASP 33. c. echo 02/2013: EF 30%, GR1DD, mild MR. echo 04/2014: EF 30%, Septal-lat dyssynchrony, global HK, inf AK, GR1DD, mild MR. d. echo 10/2014: EF50-55%, WM nl, GR1DD, septal mild paradox. e. echo 02/2015: EF 50-55%, wm   . Hypertension   . Anemia   . Automatic implantable  cardioverter-defibrillator in situ     a. MDT CRT-D 06/2014, [id number] H    . Type II diabetes mellitus ([place])   . CAD (coronary artery disease)     a. cardiac cath 101/04/2012: PCI/DES to chronically occluded mLAD, consideration PCI to diag branch in 4 weeks.   Marland Kitchen History of blood transfusion ~ 2011    "plasma; had neuropathy; couldn't walk"  . OSA on CPAP     Moderate with AHI 23/hr and now on CPAP at 16cm H2O.  Her DME is [facility]  . LBBB (left bundle branch block)   . Heart attack ([place])     PAST SURGICAL HISTORY: Past Surgical History  Procedure Laterality Date  . Lumbar disc surgery  1996    L4-5  . Incision and drainage abscess Right 2007    groin; with ICU stay due to sepsis.  . Bilateral oophorectomy  01/2011    ovarian cyst benign  . Bi-ventricular implantable cardioverter defibrillator  (crt-d)  07/06/2014  . Laparoscopic cholecystectomy  2011  . Back surgery    . Coronary angioplasty with stent placement Left 07/2012    new onset systolic CHF; elevated troponins.  Cardiac catheterization with stenting to LAD; EF 15%.  2D-echo: EF 20-25%.  . Vaginal hysterectomy  01/2011    Fibroids/DUB.  Ovaries removed. [place].  . Tubal ligation  1981  . Left heart catheterization with coronary angiogram N/A 07/21/2012  Procedure: LEFT HEART CATHETERIZATION WITH CORONARY ANGIOGRAM;  Surgeon: Jolaine Artist, MD;  Location: [facility] CATH LAB;  Service: Cardiovascular;  Laterality: N/A;  . Percutaneous coronary stent intervention (pci-s) N/A 07/23/2012    Procedure: PERCUTANEOUS CORONARY STENT INTERVENTION (PCI-S);  Surgeon: Sherren Mocha, MD;  Location: [facility] CATH LAB;  Service: Cardiovascular;  Laterality: N/A;  . Bi-ventricular implantable cardioverter defibrillator N/A 07/06/2014    Procedure: BI-VENTRICULAR IMPLANTABLE CARDIOVERTER DEFIBRILLATOR  (CRT-D);  Surgeon: Deboraha Sprang, MD;  Location: [facility] CATH LAB;  Service: Cardiovascular;  Laterality: N/A;  . Colonoscopy with propofol Left  02/22/2015    Procedure: COLONOSCOPY WITH PROPOFOL;  Surgeon: Hulen Luster, MD;  Location: [facility] ENDOSCOPY;  Service: Endoscopy;  Laterality: Left;  . Esophagogastroduodenoscopy N/A 02/22/2015    Procedure: ESOPHAGOGASTRODUODENOSCOPY (EGD);  Surgeon: Hulen Luster, MD;  Location: [facility] ENDOSCOPY;  Service: Endoscopy;  Laterality: N/A;  . Peripheral vascular catheterization N/A 02/10/2015    Procedure: Picc Line Insertion;  Surgeon: Katha Cabal, MD;  Location: [facility] CV LAB;  Service: Cardiovascular;  Laterality: N/A;  . Amputation toe Right 06/18/2015    Procedure: AMPUTATION TOE;  Surgeon: Samara Deist, DPM;  Location: [facility] ORS;  Service: Podiatry;  Laterality: Right;    SOCIAL HISTORY:  Social History  Substance Use Topics  . Smoking status: Never Smoker   . Smokeless tobacco: Never Used  . Alcohol Use: No    FAMILY HISTORY:  Family History  Problem Relation Age of Onset  . Diabetes Mother   . Hypertension Mother   . Arthritis Mother     knees, lumbar DDD, cervical DDD  . Cancer Father     prostate,skin,lymphoma.  . Obesity Brother   . Diabetes Maternal Grandmother   . Diabetes Paternal Grandmother     DRUG ALLERGIES: No Known Allergies  REVIEW OF SYSTEMS:   CONSTITUTIONAL: No fever, fatigue or weakness.  EYES: No blurred or double vision.  EARS, NOSE, AND THROAT: No tinnitus or ear pain.  RESPIRATORY: No cough, shortness of breath, wheezing or hemoptysis.  CARDIOVASCULAR: No chest pain, orthopnea, edema.  GASTROINTESTINAL: No nausea, vomiting, diarrhea or abdominal pain.  GENITOURINARY: No dysuria, hematuria.  ENDOCRINE: No polyuria, nocturia,  HEMATOLOGY: No anemia, easy bruising or bleeding SKIN: No rash or lesion. MUSCULOSKELETAL: No joint pain or arthritis.   NEUROLOGIC: No tingling, left side upper and lower extremity numbness, weakness.  PSYCHIATRY: No anxiety or depression.   MEDICATIONS AT HOME:  Prior to Admission medications   Medication Sig Start Date  End Date Taking? Authorizing Provider  aspirin EC 81 MG tablet Take 1 tablet (81 mg total) by mouth daily. 03/31/14  Yes Wardell Honour, MD  Calcium Carb-Cholecalciferol (CALCIUM 600 + D PO) Take 1 tablet by mouth 2 (two) times daily.   Yes Historical Provider, MD  carvedilol (COREG) 3.125 MG tablet Take 1 tablet (3.125 mg total) by mouth 2 (two) times daily. 07/27/15  Yes Wellington Hampshire, MD  gabapentin (NEURONTIN) 600 MG tablet TAKE 1 TABLET BY MOUTH 3 TIMES DAILY 08/20/15  Yes Wardell Honour, MD  glimepiride (AMARYL) 4 MG tablet Take 1 tablet (4 mg total) by mouth 2 (two) times daily with a meal. 08/22/15  Yes Wardell Honour, MD  Insulin Glargine (LANTUS) 100 UNIT/ML Solostar Pen Inject 10 Units into the skin daily at 10 pm. Patient taking differently: Inject 12-17 Units into the skin at bedtime.  04/27/15  Yes Wardell Honour, MD  isosorbide mononitrate (IMDUR) 30 MG 24 hr  tablet Take 1 tablet (30 mg total) by mouth daily. 07/27/15  Yes Wellington Hampshire, MD  MULTIPLE VITAMIN PO Take 1 tablet by mouth every morning.    Yes Historical Provider, MD  nitroGLYCERIN (NITROSTAT) 0.4 MG SL tablet Place 0.4 mg under the tongue every 5 (five) minutes as needed for chest pain.   Yes Historical Provider, MD  Omega-3 Fatty Acids (FISH OIL) 1000 MG CAPS Take 1,000 mg by mouth 2 (two) times daily.    Yes Historical Provider, MD  torsemide (DEMADEX) 20 MG tablet Take 1 tablet (20 mg total) by mouth daily. 09/22/15  Yes Wellington Hampshire, MD  traMADol (ULTRAM) 50 MG tablet TAKE 1 TABLET EVERY 6 HOURS AS NEEDED FOR PAIN 07/31/15  Yes Wardell Honour, MD  pravastatin (PRAVACHOL) 40 MG tablet Take 1 tablet (40 mg total) by mouth at bedtime. 03/31/14   Wardell Honour, MD      PHYSICAL EXAMINATION:   VITAL SIGNS: Blood pressure 170/79, pulse 69, temperature 98 F (36.7 C), resp. rate 12, height 5\' 8"  (1.727 m), weight 113.399 kg (250 lb), SpO2 100 %.  GENERAL:  60 y.o.-year-old patient lying in the bed with no acute  distress.  EYES: Pupils equal, round, reactive to light and accommodation. No scleral icterus. Extraocular muscles intact.  HEENT: Head atraumatic, normocephalic. Oropharynx and nasopharynx clear.  NECK:  Supple, no jugular venous distention. No thyroid enlargement, no tenderness.  LUNGS: Normal breath sounds bilaterally, no wheezing, rales,rhonchi or crepitation. No use of accessory muscles of respiration.  CARDIOVASCULAR: S1, S2 normal. No murmurs, rubs, or gallops.  ABDOMEN: Soft, nontender, nondistended. Bowel sounds present. No organomegaly or mass.  EXTREMITIES: No pedal edema, cyanosis, or clubbing.  NEUROLOGIC: Cranial nerves II through XII are intact. Muscle strength 5/5 in all extremities except LL 4/5- Could not move left foot- but it is chronic due to her neuropathy. Sensation intact. Gait not checked.  PSYCHIATRIC: The patient is alert and oriented x 3.  SKIN: No obvious rash, lesion, or ulcer.   LABORATORY PANEL:   CBC  Recent Labs Lab 11/11/15 0951  WBC 6.4  HGB 13.7  HCT 40.4  PLT 167  MCV 86.6  MCH 29.4  MCHC 34.0  RDW 13.1  LYMPHSABS 1.9  MONOABS 0.5  EOSABS 0.1  BASOSABS 0.0   ------------------------------------------------------------------------------------------------------------------  Chemistries   Recent Labs Lab 11/07/15 1454 11/11/15 0951  NA 141 143  K 4.7 4.5  CL 103 105  CO2 30 30  GLUCOSE 106* 139*  BUN 47* 58*  CREATININE 1.44* 1.36*  CALCIUM 9.0 9.4  AST  --  27  ALT  --  26  ALKPHOS  --  90  BILITOT  --  0.5   ------------------------------------------------------------------------------------------------------------------ estimated creatinine clearance is 57.4 mL/min (by C-G formula based on Cr of 1.36). ------------------------------------------------------------------------------------------------------------------ No results for input(s): TSH, T4TOTAL, T3FREE, THYROIDAB in the last 72 hours.  Invalid input(s):  FREET3   Coagulation profile  Recent Labs Lab 11/11/15 0951  INR 0.96   ------------------------------------------------------------------------------------------------------------------- No results for input(s): DDIMER in the last 72 hours. -------------------------------------------------------------------------------------------------------------------  Cardiac Enzymes  Recent Labs Lab 11/11/15 0951  TROPONINI <0.03   ------------------------------------------------------------------------------------------------------------------ Invalid input(s): POCBNP  ---------------------------------------------------------------------------------------------------------------  Urinalysis    Component Value Date/Time   COLORURINE YELLOW* 02/21/2015 0550   APPEARANCEUR CLOUDY* 02/21/2015 0550   LABSPEC 1.013 02/21/2015 0550   PHURINE 5.0 02/21/2015 0550   GLUCOSEU NEGATIVE 02/21/2015 0550   HGBUR 2+* 02/21/2015 [place] NEGATIVE 02/21/2015 0550  BILIRUBINUR neg 01/16/2015 1533   Terminous 02/21/2015 0550   PROTEINUR NEGATIVE 02/21/2015 0550   PROTEINUR trace 01/16/2015 1533   UROBILINOGEN 0.2 01/16/2015 1533   UROBILINOGEN 1.0 05/17/2010 0214   NITRITE NEGATIVE 02/21/2015 0550   NITRITE neg 01/16/2015 1533   LEUKOCYTESUR 2+* 02/21/2015 0550     RADIOLOGY: Ct Head Wo Contrast  11/11/2015  CLINICAL DATA:  Acute left-sided weakness and numbness. EXAM: CT HEAD WITHOUT CONTRAST TECHNIQUE: Contiguous axial images were obtained from the base of the skull through the vertex without intravenous contrast. COMPARISON:  MRI and CT scan of May 17, 2010. FINDINGS: Bony calvarium appears intact. Small rounded focal low density is noted in right centrum semiovale concerning for focal infarction of indeterminate age. No hemorrhage or mass lesion is noted. No mass effect or midline shift is noted. Ventricular size is within normal limits. IMPRESSION: Small rounded focal  low density seen in right centrum semiovale concerning for focal infarction of indeterminate age. These results were called by telephone at the time of interpretation on 11/11/2015 at 9:49 am to Dr. Lavonia Drafts , who verbally acknowledged these results. Electronically Signed   By: Marijo Conception, M.D.   On: 11/11/2015 09:49    EKG: Orders placed or performed during the hospital encounter of 11/11/15  . ED EKG  . ED EKG  . EKG 12-Lead  . EKG 12-Lead  . EKG 12-Lead  . EKG 12-Lead    IMPRESSION AND PLAN:  * Acute stroke  Admit to medicine, monitor telemetry.  She cannot have MRI because of her pacemaker placement.  We will get carotid Doppler study, and neurology consult.  As per her echocardiogram was done 4 days ago in her cardiologist's office at [facility].  We will try to get the results if available.   CT head positive for a small stroke  * Diabetes  Continue Lantus and glimepiride.  * Coronary artery disease status post stent  She takes aspirin, carvedilol, Imdur, nitroglycerin, pravastatin.  Continue the same.  * Chronic neuropathy  Continue gabapentin.  All the records are reviewed and case discussed with ED provider. Management plans discussed with the patient, family and they are in agreement.  CODE STATUS: Code Status History    Date Active Date Inactive Code Status Order ID Comments User Context   06/17/2015  1:48 AM 06/19/2015  4:59 PM Full Code [id number]  Harrie Foreman, MD Inpatient   02/17/2015  1:30 PM 02/24/2015  7:25 PM DNR [id number]  Bettey Costa, MD ED   07/06/2014 11:00 AM 07/07/2014  3:24 PM Full Code [id number]  Deboraha Sprang, MD Inpatient   07/22/2012  2:27 PM 07/26/2012  3:07 PM DNR [id number]  Nolon Rod, DO Inpatient       TOTAL TIME TAKING CARE OF THIS PATIENT: 50 minutes.    Vaughan Basta M.D on 11/11/2015   Between 7am to 6pm - Pager - [phone]  After 6pm go to [email or web address] - password [email or web address]   Office  [phone]  CC: Primary care physician; [name], MD   Note: This dictation was prepared with Dragon dictation along with smaller phrase technology. Any transcriptional errors that result from this process are unintentional.

## 2015-11-11 NOTE — ED Notes (Signed)
Pt went to bed around 1:00am and work at 8:30 with left sided weakness. Pt states she has been "feeling tired". Pt has pacemaker, Dfib and stent.

## 2015-11-12 DIAGNOSIS — I10 Essential (primary) hypertension: Secondary | ICD-10-CM | POA: Diagnosis not present

## 2015-11-12 DIAGNOSIS — I2510 Atherosclerotic heart disease of native coronary artery without angina pectoris: Secondary | ICD-10-CM | POA: Diagnosis not present

## 2015-11-12 DIAGNOSIS — I5022 Chronic systolic (congestive) heart failure: Secondary | ICD-10-CM | POA: Diagnosis not present

## 2015-11-12 DIAGNOSIS — I69352 Hemiplegia and hemiparesis following cerebral infarction affecting left dominant side: Secondary | ICD-10-CM | POA: Diagnosis not present

## 2015-11-12 LAB — CBC
HEMATOCRIT: 37.5 % (ref 35.0–47.0)
Hemoglobin: 12.6 g/dL (ref 12.0–16.0)
MCH: 29.2 pg (ref 26.0–34.0)
MCHC: 33.6 g/dL (ref 32.0–36.0)
MCV: 86.9 fL (ref 80.0–100.0)
PLATELETS: 146 10*3/uL — AB (ref 150–440)
RBC: 4.32 MIL/uL (ref 3.80–5.20)
RDW: 13.4 % (ref 11.5–14.5)
WBC: 6.1 10*3/uL (ref 3.6–11.0)

## 2015-11-12 LAB — BASIC METABOLIC PANEL
Anion gap: 8 (ref 5–15)
BUN: 47 mg/dL — AB (ref 6–20)
CHLORIDE: 107 mmol/L (ref 101–111)
CO2: 27 mmol/L (ref 22–32)
Calcium: 9 mg/dL (ref 8.9–10.3)
Creatinine, Ser: 1.13 mg/dL — ABNORMAL HIGH (ref 0.44–1.00)
GFR calc Af Amer: 60 mL/min — ABNORMAL LOW (ref >60)
GFR calc non Af Amer: 51 mL/min — ABNORMAL LOW (ref >60)
Glucose, Bld: 99 mg/dL (ref 65–99)
POTASSIUM: 4 mmol/L (ref 3.5–5.1)
SODIUM: 142 mmol/L (ref 135–145)

## 2015-11-12 LAB — GLUCOSE, CAPILLARY
GLUCOSE-CAPILLARY: 88 mg/dL (ref 65–99)
GLUCOSE-CAPILLARY: 196 mg/dL — AB (ref 65–99)

## 2015-11-12 MED ORDER — CLOPIDOGREL BISULFATE 75 MG PO TABS: 75.0000 mg | ORAL_TABLET | Freq: Every day | ORAL | Status: DC

## 2015-11-12 NOTE — Progress Notes (Signed)
Pt discharged home with home health. Discharge instructions given and explained to pt. Meds and follow up appointments reviewed with pt. Pt verbalized understanding.

## 2015-11-12 NOTE — Discharge Summary (Signed)
[name] NAME: [name]    MR#:  [id number]  DATE OF BIRTH:  [birth year]  DATE OF ADMISSION:  2/62/2017 ADMITTING PHYSICIAN: Vaughan Basta, MD  DATE OF DISCHARGE:11/12/2015  PRIMARY CARE PHYSICIAN: SMITH,KRISTI, MD    ADMISSION DIAGNOSIS:  CVA (cerebral infarction) [I63.9] Cerebrovascular accident (CVA), unspecified mechanism ([place]) [I63.9]  DISCHARGE DIAGNOSIS:  Active Problems:   TIA (transient ischemic attack)   CVA (cerebral infarction)   SECONDARY DIAGNOSIS:   Past Medical History  Diagnosis Date  . Obesity, unspecified   . Unspecified vitamin D deficiency   . Pure hypercholesterolemia   . Neuropathy ([place]) 2011  . Chicken pox   . Chronic systolic CHF (congestive heart failure) (HCC)     a. mixed ICM & NICM; b. EF 20-25% by echo 07/2012, mid-dist 2/3 of LV sev HK/AK, mild MR. echo 10/2012: EF 30-35%, sev HK ant-septal & inf walls, GR1DD, mild MR, PASP 33. c. echo 02/2013: EF 30%, GR1DD, mild MR. echo 04/2014: EF 30%, Septal-lat dyssynchrony, global HK, inf AK, GR1DD, mild MR. d. echo 10/2014: EF50-55%, WM nl, GR1DD, septal mild paradox. e. echo 02/2015: EF 50-55%, wm   . Hypertension   . Anemia   . Automatic implantable cardioverter-defibrillator in situ     a. MDT CRT-D 06/2014, [id number]    . Type II diabetes mellitus ([place])   . CAD (coronary artery disease)     a. cardiac cath 101/04/2012: PCI/DES to chronically occluded mLAD, consideration PCI to diag branch in 4 weeks.   Marland Kitchen History of blood transfusion ~ 2011    "plasma; had neuropathy; couldn't walk"  . OSA on CPAP     Moderate with AHI 23/hr and now on CPAP at 16cm H2O.  Her DME is [facility]  . LBBB (left bundle branch block)   . Heart attack [facility])     HOSPITAL COURSE:   * Acute stroke Admit to medicine, monitor telemetry. She cannot have MRI because of her pacemaker placement. reviewed carotid Doppler study, and neurology consult. As  per her echocardiogram was done 4 days ago in her cardiologist's office at [facility]. Neurologist reviewed result- it does not have any significant findings.  CT head positive for a small stroke, but per neurologist- it does not seem like acute stroke/   She suggested to add plavix.   PT eval is done- as pt have some more weakness on left LL than her baseline.  * Diabetes Continue Lantus and glimepiride.  * Coronary artery disease status post stent She takes aspirin, carvedilol, Imdur, nitroglycerin, pravastatin. Continue the same.  * Chronic neuropathy Continue gabapentin.  DISCHARGE CONDITIONS:   Stable.  CONSULTS OBTAINED:  Treatment Team:  Catarina Hartshorn, MD  DRUG ALLERGIES:  No Known Allergies  DISCHARGE MEDICATIONS:   Current Discharge Medication List    START taking these medications   Details  clopidogrel (PLAVIX) 75 MG tablet Take 1 tablet (75 mg total) by mouth daily. Qty: 30 tablet, Refills: 0      CONTINUE these medications which have NOT CHANGED   Details  aspirin EC 81 MG tablet Take 1 tablet (81 mg total) by mouth daily. Qty: 90 tablet, Refills: 3   Associated Diagnoses: Acute systolic heart failure (HCC)    Calcium Carb-Cholecalciferol (CALCIUM 600 + D PO) Take 1 tablet by mouth 2 (two) times daily.    carvedilol (COREG) 3.125 MG tablet Take 1 tablet (3.125 mg total) by mouth 2 (two) times daily.  Qty: 60 tablet, Refills: 6    gabapentin (NEURONTIN) 600 MG tablet TAKE 1 TABLET BY MOUTH 3 TIMES DAILY Qty: 270 tablet, Refills: 0    glimepiride (AMARYL) 4 MG tablet Take 1 tablet (4 mg total) by mouth 2 (two) times daily with a meal. Qty: 30 tablet, Refills: 3    Insulin Glargine (LANTUS) 100 UNIT/ML Solostar Pen Inject 10 Units into the skin daily at 10 pm. Qty: 15 mL, Refills: 11    isosorbide mononitrate (IMDUR) 30 MG 24 hr tablet Take 1 tablet (30 mg total) by mouth daily. Qty: 30 tablet, Refills: 6    MULTIPLE VITAMIN PO Take 1  tablet by mouth every morning.     nitroGLYCERIN (NITROSTAT) 0.4 MG SL tablet Place 0.4 mg under the tongue every 5 (five) minutes as needed for chest pain.    Omega-3 Fatty Acids (FISH OIL) 1000 MG CAPS Take 1,000 mg by mouth 2 (two) times daily.     torsemide (DEMADEX) 20 MG tablet Take 1 tablet (20 mg total) by mouth daily. Qty: 30 tablet, Refills: 11   Associated Diagnoses: Acute systolic heart failure (HCC)    traMADol (ULTRAM) 50 MG tablet TAKE 1 TABLET EVERY 6 HOURS AS NEEDED FOR PAIN Qty: 120 tablet, Refills: 5    pravastatin (PRAVACHOL) 40 MG tablet Take 1 tablet (40 mg total) by mouth at bedtime. Qty: 90 tablet, Refills: 3   Associated Diagnoses: Pure hypercholesterolemia         DISCHARGE INSTRUCTIONS:   Follow with PMD in 2 weeks,.  If you experience worsening of your admission symptoms, develop shortness of breath, life threatening emergency, suicidal or homicidal thoughts you must seek medical attention immediately by calling 911 or calling your MD immediately  if symptoms less severe.  You Must read complete instructions/literature along with all the possible adverse reactions/side effects for all the Medicines you take and that have been prescribed to you. Take any new Medicines after you have completely understood and accept all the possible adverse reactions/side effects.   Please note  You were cared for by a hospitalist during your hospital stay. If you have any questions about your discharge medications or the care you received while you were in the hospital after you are discharged, you can call the unit and asked to speak with the hospitalist on call if the hospitalist that took care of you is not available. Once you are discharged, your primary care physician will handle any further medical issues. Please note that NO REFILLS for any discharge medications will be authorized once you are discharged, as it is imperative that you return to your primary care  physician (or establish a relationship with a primary care physician if you do not have one) for your aftercare needs so that they can reassess your need for medications and monitor your lab values.    Today   CHIEF COMPLAINT:   Chief Complaint  Patient presents with  . Extremity Weakness  . Code Stroke    HISTORY OF PRESENT ILLNESS:  Keeli Achter  is a 60 y.o. 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 Today morning she woke up and tried to get out of the bed and she noticed her left lower extremity is weaker than her baseline, she could still walk. She also felt some numbness  on her leg on the left side. Within a few hours she started noticing her left arm also numb and left side of her face was numb also. She spoke to her husband and they decided to bring her to emergency room. In ER CT scan of the head was having a small stroke, tele neurology consult was done, and he suggested to admit for further stroke workup and monitoring.   VITAL SIGNS:  Blood pressure 135/58, pulse 73, temperature 97.4 F (36.3 C), temperature source Oral, resp. rate 18, height 5\' 8"  (1.727 m), weight 114.624 kg (252 lb 11.2 oz), SpO2 98 %.  I/O:   Intake/Output Summary (Last 24 hours) at 11/12/15 1012 Last data filed at 11/12/15 0144  Gross per 24 hour  Intake    240 ml  Output    700 ml  Net   -460 ml    PHYSICAL EXAMINATION:   GENERAL: 60 y.o.-year-old patient lying in the bed with no acute distress.  EYES: Pupils equal, round, reactive to light and accommodation. No scleral icterus. Extraocular muscles intact.  HEENT: Head atraumatic, normocephalic. Oropharynx and nasopharynx clear.  NECK: Supple, no jugular venous distention. No thyroid enlargement, no tenderness.  LUNGS: Normal  breath sounds bilaterally, no wheezing, rales,rhonchi or crepitation. No use of accessory muscles of respiration.  CARDIOVASCULAR: S1, S2 normal. No murmurs, rubs, or gallops.  ABDOMEN: Soft, nontender, nondistended. Bowel sounds present. No organomegaly or mass.  EXTREMITIES: No pedal edema, cyanosis, or clubbing.  NEUROLOGIC: Cranial nerves II through XII are intact. Muscle strength 5/5 in all extremities except LL 4/5- Could not move left foot- but it is chronic due to her neuropathy. Sensation intact. Gait not checked.  PSYCHIATRIC: The patient is alert and oriented x 3.  SKIN: No obvious rash, lesion, or ulcer.    DATA REVIEW:   CBC  Recent Labs Lab 11/12/15 0613  WBC 6.1  HGB 12.6  HCT 37.5  PLT 146*    Chemistries   Recent Labs Lab 11/11/15 0951 11/12/15 0613  NA 143 142  K 4.5 4.0  CL 105 107  CO2 30 27  GLUCOSE 139* 99  BUN 58* 47*  CREATININE 1.36* 1.13*  CALCIUM 9.4 9.0  AST 27  --   ALT 26  --   ALKPHOS 90  --   BILITOT 0.5  --     Cardiac Enzymes  Recent Labs Lab 11/11/15 0951  TROPONINI <0.03    Microbiology Results  Results for orders placed or performed during the hospital encounter of 06/16/15  Wound culture     Status: None   Collection Time: 06/18/15 12:15 AM  Result Value Ref Range Status   Specimen Description TOE  Final   Special Requests Immunocompromised  Final   Gram Stain   Final    RARE WBC SEEN FEW GRAM POSITIVE COCCI IN TETRADS IN PAIRS FEW GRAM NEGATIVE RODS    Culture   Final    MODERATE GROWTH METHICILLIN RESISTANT STAPHYLOCOCCUS AUREUS CRITICAL RESULT CALLED TO, READ BACK BY AND VERIFIED WITH: LAURIE WOODS 06/20/15 1157 JGF    Report Status 06/20/2015 FINAL  Final   Organism ID, Bacteria METHICILLIN RESISTANT STAPHYLOCOCCUS AUREUS  Final      Susceptibility   Methicillin resistant staphylococcus aureus - MIC*    CIPROFLOXACIN >=8 RESISTANT Resistant     ERYTHROMYCIN >=8 RESISTANT Resistant     GENTAMICIN  <=0.5 SENSITIVE Sensitive     OXACILLIN >=4 RESISTANT Resistant     VANCOMYCIN 1 SENSITIVE  Sensitive     TRIMETH/SULFA <=10 SENSITIVE Sensitive     CLINDAMYCIN >=8 RESISTANT Resistant     CEFOXITIN SCREEN Value in next row Resistant      POSITIVECEFOXITIN SCREEN - This test may be used to predict mecA-mediated oxacillin resistance, and it is based on the cefoxitin disk screen test.  The cefoxitin screen and oxacillin work in combination to determine the final interpretation reported for oxacillin.     Inducible Clindamycin Value in next row Sensitive      POSITIVECEFOXITIN SCREEN - This test may be used to predict mecA-mediated oxacillin resistance, and it is based on the cefoxitin disk screen test.  The cefoxitin screen and oxacillin work in combination to determine the final interpretation reported for oxacillin.     * MODERATE GROWTH METHICILLIN RESISTANT STAPHYLOCOCCUS AUREUS    RADIOLOGY:  Ct Head Wo Contrast  11/11/2015  CLINICAL DATA:  Acute left-sided weakness and numbness. EXAM: CT HEAD WITHOUT CONTRAST TECHNIQUE: Contiguous axial images were obtained from the base of the skull through the vertex without intravenous contrast. COMPARISON:  MRI and CT scan of May 17, 2010. FINDINGS: Bony calvarium appears intact. Small rounded focal low density is noted in right centrum semiovale concerning for focal infarction of indeterminate age. No hemorrhage or mass lesion is noted. No mass effect or midline shift is noted. Ventricular size is within normal limits. IMPRESSION: Small rounded focal low density seen in right centrum semiovale concerning for focal infarction of indeterminate age. These results were called by telephone at the time of interpretation on 11/11/2015 at 9:49 am to Dr. Lavonia Drafts , who verbally acknowledged these results. Electronically Signed   By: Marijo Conception, M.D.   On: 11/11/2015 09:49   US Carotid Bilateral  11/11/2015  CLINICAL DATA:  CVA. EXAM: BILATERAL CAROTID DUPLEX  ULTRASOUND TECHNIQUE: Pearline Cables scale imaging, color Doppler and duplex ultrasound were performed of bilateral carotid and vertebral arteries in the neck. COMPARISON:  None. FINDINGS: Criteria: Quantification of carotid stenosis is based on velocity parameters that correlate the residual internal carotid diameter with NASCET-based stenosis levels, using the diameter of the distal internal carotid lumen as the denominator for stenosis measurement. The following velocity measurements were obtained: RIGHT ICA:  89 cm/sec CCA:  0000000 cm/sec SYSTOLIC ICA/CCA RATIO:  0.7 DIASTOLIC ICA/CCA RATIO:  1.2 ECA:  77 cm/sec LEFT ICA:  118 cm/sec CCA:  98 cm/sec SYSTOLIC ICA/CCA RATIO:  1.2 DIASTOLIC ICA/CCA RATIO:  1.5 ECA:  120 cm/sec RIGHT CAROTID ARTERY: Small amount of plaque at the right carotid bulb without significant stenosis. Right external carotid artery is patent without significant plaque or stenosis. Small amount of plaque in the proximal internal carotid artery with normal waveforms and velocities. RIGHT VERTEBRAL ARTERY: Antegrade flow and normal waveform in the right vertebral artery. LEFT CAROTID ARTERY: Small amount of plaque and intimal thickening in the distal common carotid artery without stenosis. Small amount echogenic plaque at the left carotid bulb and proximal internal carotid artery. Normal waveforms and velocities within the left internal carotid artery. Mixed plaque in the internal carotid artery. LEFT VERTEBRAL ARTERY: Antegrade flow and normal waveform in the left vertebral artery. Other: There is a left thyroid nodule along the lower pole, measuring 2.2 x 1.5 x 1.8 cm. IMPRESSION: Mild atherosclerotic disease in the carotid arteries bilaterally. Estimated degree of stenosis in the internal carotid arteries is less than 50% bilaterally. Patent vertebral arteries. Left thyroid nodule measuring up to 2.2 cm. This nodule meets criteria for biopsy.  Recommend dedicated ultrasound of the thyroid to look for  additional nodules and consider biopsy of the dominant left thyroid nodule. This recommendation follows the consensus statement: Management of Thyroid Nodules Detected at [place]: Society of Radiologists in [place]. Radiology 2005; [id number]. Electronically Signed   By: Markus Daft M.D.   On: 11/11/2015 13:46     Management plans discussed with the patient, family and they are in agreement.  CODE STATUS:     Code Status Orders        Start     Ordered   11/11/15 1412  Full code   Continuous     11/11/15 1411    Code Status History    Date Active Date Inactive Code Status Order ID Comments User Context   06/17/2015  1:48 AM 06/19/2015  4:59 PM Full Code [id number]  Harrie Foreman, MD Inpatient   02/17/2015  1:30 PM 02/24/2015  7:25 PM DNR [id number]  Bettey Costa, MD ED   07/06/2014 11:00 AM 07/07/2014  3:24 PM Full Code [id number]  Deboraha Sprang, MD Inpatient   07/22/2012  2:27 PM 07/26/2012  3:07 PM DNR [id number]  Nolon Rod, DO Inpatient    Advance Directive Documentation        Most Recent Value   Type of Advance Directive  Living will   Pre-existing out of facility DNR order (yellow form or pink MOST form)     "MOST" Form in Place?        TOTAL TIME TAKING CARE OF THIS PATIENT: 35 minutes.    Vaughan Basta M.D on 11/12/2015 at 10:12 AM  Between 7am to 6pm - Pager - [phone]  After 6pm go to [email or web address] - password [email or web address] [facility]  Office  [phone]  CC: Primary care physician; [name], MD   Note: This dictation was prepared with Dragon dictation along with smaller phrase technology. Any transcriptional errors that result from this process are unintentional.

## 2015-11-12 NOTE — Progress Notes (Signed)
I called pt's home to inform her about thyroid nodule found on Carotid doppler.  I told her to discuss this with her PMD and she may be referred for biopsy by PMD.  She understood and will make an appointment with PMD in next 1-2 weeks.

## 2015-11-12 NOTE — Care Management Note (Signed)
Case Management Note  Patient Details  Name: LAKEVIA WAWRZYNIAK MRN: [id number] Date of Birth: [birth year]  Subjective/Objective:      Discussed discharge planning with Mrs Kennerson. She chose [facility] as her home health provider for PT and Nurse aid. A referral was faxed to [place]. Ms Clewis reports that she already has a rolling walker.               Action/Plan:   Expected Discharge Date:                  Expected Discharge Plan:     In-House Referral:     Discharge planning Services     Post Acute Care Choice:    Choice offered to:     DME Arranged:    DME Agency:     HH Arranged:    [place] Agency:     Status of Service:     Medicare Important Message Given:    Date Medicare IM Given:    Medicare IM give by:    Date Additional Medicare IM Given:    Additional Medicare Important Message give by:     If discussed at [place] of Stay Meetings, dates discussed:    Additional Comments:  Floyed Masoud A, RN 11/12/2015, 11:04 AM

## 2015-11-12 NOTE — Evaluation (Signed)
Physical Therapy Evaluation Patient Details Name: [name] MRN: [id number] DOB: 05/27/1956 Today's Date: 11/12/2015   History of Present Illness  Pt has history of some L sided weakness, L foot drop and b/l LE neuropathy.    Clinical Impression  Pt is able to ambulate with walker and with single UE use of rail.  She has new onset numbness on L side (face, UE, LE) but reports to feel close to her baseline regarding strength - she has baseline foot drop and L hand contracture.  She shows good confidence and safety with ambulation and though she is not at her baseline she should be able to go home with HHPT w/o issue.    Follow Up Recommendations Home health PT    Equipment Recommendations       Recommendations for Other Services       Precautions / Restrictions Precautions Precautions: Fall Restrictions Weight Bearing Restrictions: No      Mobility  Bed Mobility Overal bed mobility: Independent                Transfers Overall transfer level: Independent Equipment used: Rolling walker (2 wheeled)             General transfer comment: Pt able to rise w/o assist and shows good confidence  Ambulation/Gait Ambulation/Gait assistance: Supervision Ambulation Distance (Feet): 75 Feet Assistive device: Rolling walker (2 wheeled) (hallway rail)       General Gait Details: Pt has significant foot drop on the L that force her to exaggerate L hip drive to clear (she reports this at baseline, but when she wears shows it is much better). She is reliant on UEs but does well with single UE use on hallway rails for ~20 ft.  Pt with no LOBs and no fatigue.   Stairs            Wheelchair Mobility    Modified Rankin (Stroke Patients Only)       Balance                                             Pertinent Vitals/Pain Pain Assessment: No/denies pain    Home Living Family/patient expects to be discharged to:: Private residence Living  Arrangements: Spouse/significant other;Children Available Help at Discharge: Family;Available 24 hours/day Type of Home: House Home Access: Stairs to enter   [employer] of Steps: 1   Home Equipment: Walker - 2 wheels;Bedside commode;Shower seat;Wheelchair - manual      Prior Function Level of Independence: Independent with assistive device(s)               Hand Dominance   Dominant Hand: Right    Extremity/Trunk Assessment   Upper Extremity Assessment: LUE deficits/detail       LUE Deficits / Details: Pt has long term L hand grip limitations, shoulder/elbow grossly 4-/5, L UE with decreased sensation   Lower Extremity Assessment: LLE deficits/detail   LLE Deficits / Details: Pt has long standing foot drop, otherwise shows functional L LE strength in knee and hip.  Pt does have significant numbness in L LE.     Communication   Communication: No difficulties  Cognition Arousal/Alertness: Awake/alert Behavior During Therapy: WFL for tasks assessed/performed Overall Cognitive Status: Within Functional Limits for tasks assessed  General Comments      Exercises        Assessment/Plan    PT Assessment Patient needs continued PT services  PT Diagnosis Difficulty walking;Generalized weakness   PT Problem List Decreased strength;Decreased activity tolerance;Decreased balance;Decreased range of motion;Decreased mobility;Decreased coordination;Decreased safety awareness  PT Treatment Interventions DME instruction;Gait training;Stair training;Therapeutic exercise;Functional mobility training;Therapeutic activities;Balance training;Neuromuscular re-education   PT Goals (Current goals can be found in the Care Plan section) Acute Rehab PT Goals Patient Stated Goal: Go home PT Goal Formulation: With patient Time For Goal Achievement: 11/26/15 Potential to Achieve Goals: Good    Frequency 7X/week   Barriers to discharge         Co-evaluation               End of Session Equipment Utilized During Treatment: Gait belt Activity Tolerance: Patient tolerated treatment well Patient left: in bed;with call bell/phone within reach;with nursing/sitter in room Nurse Communication: Mobility status         Time: [id number] PT Time Calculation (min) (ACUTE ONLY): 20 min   Charges:   PT Evaluation $PT Eval Low Complexity: 1 Procedure     PT G Codes:       Wayne Both, PT, DPT [phone]  Kreg Shropshire 11/12/2015, 12:31 PM

## 2015-11-13 DIAGNOSIS — D649 Anemia, unspecified: Secondary | ICD-10-CM | POA: Diagnosis not present

## 2015-11-13 DIAGNOSIS — E559 Vitamin D deficiency, unspecified: Secondary | ICD-10-CM | POA: Diagnosis not present

## 2015-11-13 DIAGNOSIS — E669 Obesity, unspecified: Secondary | ICD-10-CM | POA: Diagnosis not present

## 2015-11-13 DIAGNOSIS — I69354 Hemiplegia and hemiparesis following cerebral infarction affecting left non-dominant side: Secondary | ICD-10-CM | POA: Diagnosis not present

## 2015-11-13 DIAGNOSIS — Z7982 Long term (current) use of aspirin: Secondary | ICD-10-CM | POA: Diagnosis not present

## 2015-11-13 DIAGNOSIS — Z794 Long term (current) use of insulin: Secondary | ICD-10-CM | POA: Diagnosis not present

## 2015-11-13 DIAGNOSIS — Z7984 Long term (current) use of oral hypoglycemic drugs: Secondary | ICD-10-CM | POA: Diagnosis not present

## 2015-11-13 DIAGNOSIS — I110 Hypertensive heart disease with heart failure: Secondary | ICD-10-CM | POA: Diagnosis not present

## 2015-11-13 DIAGNOSIS — E1140 Type 2 diabetes mellitus with diabetic neuropathy, unspecified: Secondary | ICD-10-CM | POA: Diagnosis not present

## 2015-11-13 DIAGNOSIS — Z950 Presence of cardiac pacemaker: Secondary | ICD-10-CM | POA: Diagnosis not present

## 2015-11-13 DIAGNOSIS — I5022 Chronic systolic (congestive) heart failure: Secondary | ICD-10-CM | POA: Diagnosis not present

## 2015-11-13 DIAGNOSIS — G4733 Obstructive sleep apnea (adult) (pediatric): Secondary | ICD-10-CM | POA: Diagnosis not present

## 2015-11-13 DIAGNOSIS — I2510 Atherosclerotic heart disease of native coronary artery without angina pectoris: Secondary | ICD-10-CM | POA: Diagnosis not present

## 2015-11-15 ENCOUNTER — Other Ambulatory Visit: Payer: Self-pay | Admitting: *Deleted

## 2015-11-15 ENCOUNTER — Telehealth: Payer: Self-pay | Admitting: *Deleted

## 2015-11-15 DIAGNOSIS — Z794 Long term (current) use of insulin: Secondary | ICD-10-CM | POA: Diagnosis not present

## 2015-11-15 DIAGNOSIS — D649 Anemia, unspecified: Secondary | ICD-10-CM | POA: Diagnosis not present

## 2015-11-15 DIAGNOSIS — E669 Obesity, unspecified: Secondary | ICD-10-CM | POA: Diagnosis not present

## 2015-11-15 DIAGNOSIS — Z7982 Long term (current) use of aspirin: Secondary | ICD-10-CM | POA: Diagnosis not present

## 2015-11-15 DIAGNOSIS — M21372 Foot drop, left foot: Secondary | ICD-10-CM

## 2015-11-15 DIAGNOSIS — Z950 Presence of cardiac pacemaker: Secondary | ICD-10-CM | POA: Diagnosis not present

## 2015-11-15 DIAGNOSIS — I5022 Chronic systolic (congestive) heart failure: Secondary | ICD-10-CM | POA: Diagnosis not present

## 2015-11-15 DIAGNOSIS — Z7984 Long term (current) use of oral hypoglycemic drugs: Secondary | ICD-10-CM | POA: Diagnosis not present

## 2015-11-15 DIAGNOSIS — I2510 Atherosclerotic heart disease of native coronary artery without angina pectoris: Secondary | ICD-10-CM | POA: Diagnosis not present

## 2015-11-15 DIAGNOSIS — E559 Vitamin D deficiency, unspecified: Secondary | ICD-10-CM | POA: Diagnosis not present

## 2015-11-15 DIAGNOSIS — G4733 Obstructive sleep apnea (adult) (pediatric): Secondary | ICD-10-CM | POA: Diagnosis not present

## 2015-11-15 DIAGNOSIS — E1140 Type 2 diabetes mellitus with diabetic neuropathy, unspecified: Secondary | ICD-10-CM | POA: Diagnosis not present

## 2015-11-15 DIAGNOSIS — I110 Hypertensive heart disease with heart failure: Secondary | ICD-10-CM | POA: Diagnosis not present

## 2015-11-15 DIAGNOSIS — I69354 Hemiplegia and hemiparesis following cerebral infarction affecting left non-dominant side: Secondary | ICD-10-CM | POA: Diagnosis not present

## 2015-11-15 MED ORDER — AMBULATORY NON FORMULARY MEDICATION: 1.0000 [IU] | Freq: Once | Status: DC

## 2015-11-15 NOTE — Telephone Encounter (Signed)
Patient called to let us know that she had a stroke on Saturday which has made her foot drop worse.  She is requesting a foot brace for her left foot.  I will get Rx for you to sign and mail it to patient.

## 2015-11-16 NOTE — Telephone Encounter (Signed)
I'm sorry to hear this.  Rx signed.  Let's also schedule her for a sooner f/u appointment.   Donika K. Posey Pronto, DO

## 2015-11-17 ENCOUNTER — Ambulatory Visit (INDEPENDENT_AMBULATORY_CARE_PROVIDER_SITE_OTHER): Payer: PPO | Admitting: Family Medicine

## 2015-11-17 DIAGNOSIS — I2510 Atherosclerotic heart disease of native coronary artery without angina pectoris: Secondary | ICD-10-CM | POA: Diagnosis not present

## 2015-11-17 DIAGNOSIS — I63419 Cerebral infarction due to embolism of unspecified middle cerebral artery: Secondary | ICD-10-CM

## 2015-11-17 DIAGNOSIS — E7800 Pure hypercholesterolemia, unspecified: Secondary | ICD-10-CM

## 2015-11-17 DIAGNOSIS — G458 Other transient cerebral ischemic attacks and related syndromes: Secondary | ICD-10-CM | POA: Diagnosis not present

## 2015-11-17 DIAGNOSIS — E041 Nontoxic single thyroid nodule: Secondary | ICD-10-CM | POA: Diagnosis not present

## 2015-11-17 DIAGNOSIS — E1142 Type 2 diabetes mellitus with diabetic polyneuropathy: Secondary | ICD-10-CM | POA: Diagnosis not present

## 2015-11-17 LAB — COMPREHENSIVE METABOLIC PANEL
ALT: 21 U/L (ref 6–29)
AST: 21 U/L (ref 10–35)
Albumin: 4 g/dL (ref 3.6–5.1)
Alkaline Phosphatase: 105 U/L (ref 33–130)
BUN: 51 mg/dL — AB (ref 7–25)
CHLORIDE: 98 mmol/L (ref 98–110)
CO2: 30 mmol/L (ref 20–31)
CREATININE: 1.58 mg/dL — AB (ref 0.50–0.99)
Calcium: 9.4 mg/dL (ref 8.6–10.4)
GLUCOSE: 305 mg/dL — AB (ref 65–99)
POTASSIUM: 4.7 mmol/L (ref 3.5–5.3)
SODIUM: 138 mmol/L (ref 135–146)
TOTAL PROTEIN: 7.1 g/dL (ref 6.1–8.1)
Total Bilirubin: 0.4 mg/dL (ref 0.2–1.2)

## 2015-11-17 LAB — CBC WITH DIFFERENTIAL/PLATELET
BASOS ABS: 0 10*3/uL (ref 0.0–0.1)
BASOS PCT: 0 % (ref 0–1)
EOS ABS: 0.1 10*3/uL (ref 0.0–0.7)
EOS PCT: 1 % (ref 0–5)
HCT: 38.8 % (ref 36.0–46.0)
Hemoglobin: 12.7 g/dL (ref 12.0–15.0)
LYMPHS ABS: 1.8 10*3/uL (ref 0.7–4.0)
Lymphocytes Relative: 30 % (ref 12–46)
MCH: 29.7 pg (ref 26.0–34.0)
MCHC: 32.7 g/dL (ref 30.0–36.0)
MCV: 90.7 fL (ref 78.0–100.0)
MONOS PCT: 7 % (ref 3–12)
MPV: 9.9 fL (ref 8.6–12.4)
Monocytes Absolute: 0.4 10*3/uL (ref 0.1–1.0)
NEUTROS PCT: 62 % (ref 43–77)
Neutro Abs: 3.8 10*3/uL (ref 1.7–7.7)
PLATELETS: 170 10*3/uL (ref 150–400)
RBC: 4.28 MIL/uL (ref 3.87–5.11)
RDW: 13.3 % (ref 11.5–15.5)
WBC: 6.1 10*3/uL (ref 4.0–10.5)

## 2015-11-17 MED ORDER — PRAVASTATIN SODIUM 40 MG PO TABS: 40.0000 mg | ORAL_TABLET | Freq: Every day | ORAL | Status: DC

## 2015-11-17 NOTE — Progress Notes (Signed)
Subjective:    Patient ID: [name], female    DOB: 10/15/1955, 60 y.o.   MRN: [id number]  11/17/2015  Follow-up   HPI This 60 y.o. female presents for TRANSITION INTO CARE from recent admission for possible CVA/TIA.  Presented     DATE OF ADMISSION: 2/4/2017ADMITTING PHYSICIAN: Altamese Dilling, MD  DATE OF DISCHARGE:11/12/2015  PRIMARY CARE PHYSICIAN: Ysenia Filice, MD    ADMISSION DIAGNOSIS:  CVA (cerebral infarction) [I63.9] Cerebrovascular accident (CVA), unspecified mechanism (HCC) [I63.9]  DISCHARGE DIAGNOSIS:  Active Problems:  TIA (transient ischemic attack)  CVA (cerebral infarction)   SECONDARY DIAGNOSIS:   Past Medical History  Diagnosis Date  . Obesity, unspecified   . Unspecified vitamin D deficiency   . Pure hypercholesterolemia   . Neuropathy (HCC) 2011  . Chicken pox   . Chronic systolic CHF (congestive heart failure) (HCC)     a. mixed ICM & NICM; b. EF 20-25% by echo 07/2012, mid-dist 2/3 of LV sev HK/AK, mild MR. echo 10/2012: EF 30-35%, sev HK ant-septal & inf walls, GR1DD, mild MR, PASP 33. c. echo 02/2013: EF 30%, GR1DD, mild MR. echo 04/2014: EF 30%, Septal-lat dyssynchrony, global HK, inf AK, GR1DD, mild MR. d. echo 10/2014: EF50-55%, WM nl, GR1DD, septal mild paradox. e. echo 02/2015: EF 50-55%, wm   . Hypertension   . Anemia   . Automatic implantable cardioverter-defibrillator in situ     a. MDT CRT-D 06/2014, SN: [id number]   . Type II diabetes mellitus (HCC)   . CAD (coronary artery disease)     a. cardiac cath 101/04/2012: PCI/DES to chronically occluded mLAD, consideration PCI to diag branch in 4 weeks.   Marland Kitchen History of blood transfusion ~ 2011    "plasma; had neuropathy; couldn't walk"  . OSA on CPAP     Moderate with AHI 23/hr and now on CPAP at 16cm H2O. Her DME is [facility]  . LBBB (left bundle branch block)   . Heart attack [facility])     HOSPITAL  COURSE:   * Acute stroke Admit to medicine, monitor telemetry. She cannot have MRI because of her pacemaker placement. reviewed carotid Doppler study, and neurology consult. As per her echocardiogram was done 4 days ago in her cardiologist's office at [facility]. Neurologist reviewed result- it does not have any significant findings.  CT head positive for a small stroke, but per neurologist- it does not seem like acute stroke/  She suggested to add plavix.  PT eval is done- as pt have some more weakness on left LL than her baseline.  * Diabetes Continue Lantus and glimepiride.  * Coronary artery disease status post stent She takes aspirin, carvedilol, Imdur, nitroglycerin, pravastatin. Continue the same.  * Chronic neuropathy Continue gabapentin.  DISCHARGE CONDITIONS:   Stable.  CONSULTS OBTAINED:  Treatment Team: Kym Groom, MD  DRUG ALLERGIES:  No Known Allergies  DISCHARGE MEDICATIONS:   Current Discharge Medication List    START taking these medications   Details  clopidogrel (PLAVIX) 75 MG tablet Take 1 tablet (75 mg total) by mouth daily. Qty: 30 tablet, Refills: 0      CONTINUE these medications which have NOT CHANGED   Details  aspirin EC 81 MG tablet Take 1 tablet (81 mg total) by mouth daily. Qty: 90 tablet, Refills: 3   Associated Diagnoses: Acute systolic heart failure (HCC)    Calcium Carb-Cholecalciferol (CALCIUM 600 + D PO) Take 1 tablet by mouth 2 (two) times daily.    carvedilol (COREG) 3.125 MG  tablet Take 1 tablet (3.125 mg total) by mouth 2 (two) times daily. Qty: 60 tablet, Refills: 6    gabapentin (NEURONTIN) 600 MG tablet TAKE 1 TABLET BY MOUTH 3 TIMES DAILY Qty: 270 tablet, Refills: 0    glimepiride (AMARYL) 4 MG tablet Take 1 tablet (4 mg total) by mouth 2 (two) times daily with a meal. Qty: 30 tablet, Refills: 3    Insulin Glargine (LANTUS) 100 UNIT/ML Solostar Pen  Inject 10 Units into the skin daily at 10 pm. Qty: 15 mL, Refills: 11    isosorbide mononitrate (IMDUR) 30 MG 24 hr tablet Take 1 tablet (30 mg total) by mouth daily. Qty: 30 tablet, Refills: 6    MULTIPLE VITAMIN PO Take 1 tablet by mouth every morning.     nitroGLYCERIN (NITROSTAT) 0.4 MG SL tablet Place 0.4 mg under the tongue every 5 (five) minutes as needed for chest pain.    Omega-3 Fatty Acids (FISH OIL) 1000 MG CAPS Take 1,000 mg by mouth 2 (two) times daily.     torsemide (DEMADEX) 20 MG tablet Take 1 tablet (20 mg total) by mouth daily. Qty: 30 tablet, Refills: 11   Associated Diagnoses: Acute systolic heart failure (HCC)    traMADol (ULTRAM) 50 MG tablet TAKE 1 TABLET EVERY 6 HOURS AS NEEDED FOR PAIN Qty: 120 tablet, Refills: 5    pravastatin (PRAVACHOL) 40 MG tablet Take 1 tablet (40 mg total) by mouth at bedtime. Qty: 90 tablet, Refills: 3   Associated Diagnoses: Pure hypercholesterolemia         DISCHARGE INSTRUCTIONS:   Follow with PMD in 2 weeks,.  If you experience worsening of your admission symptoms, develop shortness of breath, life threatening emergency, suicidal or homicidal thoughts you must seek medical attention immediately by calling 911 or calling your MD immediately if symptoms less severe.  You Must read complete instructions/literature along with all the possible adverse reactions/side effects for all the Medicines you take and that have been prescribed to you. Take any new Medicines after you have completely understood and accept all the possible adverse reactions/side effects.   Please note  You were cared for by a hospitalist during your hospital stay. If you have any questions about your discharge medications or the care you received while you were in the hospital after you are discharged, you can call the unit and asked to speak with the hospitalist on call if the hospitalist that took care of you is not  available. Once you are discharged, your primary care physician will handle any further medical issues. Please note that NO REFILLS for any discharge medications will be authorized once you are discharged, as it is imperative that you return to your primary care physician (or establish a relationship with a primary care physician if you do not have one) for your aftercare needs so that they can reassess your need for medications and monitor your lab values.          TIA/CVA: fine when went to bed the night prior.  Around 6::30am that morning, L foot felt funny; thought went to sleep.  When wen to sit on toilet, got disoriented and almost missed the toilet.  Crawled back into bed. Awoke at 8:30am, noticed foot was more numb.  Was feeling much different from baseline.  Noticed at breakfast, L hand got numb.  Could notice that face and back of head was becoming numb progressively; presented immediately.  S/p CT R centrum semiovale.  Symptoms started gradually resolving  within 24 hours. Still has numbnes sin L hand and L leg proximally.  Now L foot has more of a drop.  HH referral doing PT for twice weekly; getting brace made L foot.  Old brace got too little; cannot wear it any longer.  S/p echo four days prior to onset.  S/p carotid dopplers mild stenosis.  Neurology consultation during admission; added Plavix.    Thyroid nodule L: detected on carotid doppler 2.2 cm.  Needs further evaluation.  Hyperlipidemia: Prvastatin stopped a while ago; not sure who stopped it.    Review of Systems  Constitutional: Negative for fever, chills, diaphoresis and fatigue.  Eyes: Negative for visual disturbance.  Respiratory: Negative for cough and shortness of breath.   Cardiovascular: Negative for chest pain, palpitations and leg swelling.  Gastrointestinal: Negative for nausea, vomiting, abdominal pain, diarrhea and constipation.  Endocrine: Negative for cold intolerance, heat intolerance, polydipsia, polyphagia  and polyuria.  Neurological: Negative for dizziness, tremors, seizures, syncope, facial asymmetry, speech difficulty, weakness, light-headedness, numbness and headaches.    Past Medical History  Diagnosis Date  . Obesity, unspecified   . Unspecified vitamin D deficiency   . Pure hypercholesterolemia   . Neuropathy (HCC) 2011  . Chicken pox   . Chronic systolic CHF (congestive heart failure) (HCC)     a. mixed ICM & NICM; b. EF 20-25% by echo 07/2012, mid-dist 2/3 of LV sev HK/AK, mild MR. echo 10/2012: EF 30-35%, sev HK ant-septal & inf walls, GR1DD, mild MR, PASP 33. c. echo 02/2013: EF 30%, GR1DD, mild MR. echo 04/2014: EF 30%, Septal-lat dyssynchrony, global HK, inf AK, GR1DD, mild MR. d. echo 10/2014: EF50-55%, WM nl, GR1DD, septal mild paradox. e. echo 02/2015: EF 50-55%, wm   . Hypertension   . Anemia   . Automatic implantable cardioverter-defibrillator in situ     a. MDT CRT-D 06/2014, SN: [id number]    . Type II diabetes mellitus (HCC)   . CAD (coronary artery disease)     a. cardiac cath 101/04/2012: PCI/DES to chronically occluded mLAD, consideration PCI to diag branch in 4 weeks.   Marland Kitchen History of blood transfusion ~ 2011    "plasma; had neuropathy; couldn't walk"  . OSA on CPAP     Moderate with AHI 23/hr and now on CPAP at 16cm H2O.  Her DME is [facility]  . LBBB (left bundle branch block)   . Heart attack [facility])    Past Surgical History  Procedure Laterality Date  . Lumbar disc surgery  1996    L4-5  . Incision and drainage abscess Right 2007    groin; with ICU stay due to sepsis.  . Bilateral oophorectomy  01/2011    ovarian cyst benign  . Bi-ventricular implantable cardioverter defibrillator  (crt-d)  07/06/2014  . Laparoscopic cholecystectomy  2011  . Back surgery    . Coronary angioplasty with stent placement Left 07/2012    new onset systolic CHF; elevated troponins.  Cardiac catheterization with stenting to LAD; EF 15%.  2D-echo: EF 20-25%.  . Tubal ligation  1981  . Left  heart catheterization with coronary angiogram N/A 07/21/2012    Procedure: LEFT HEART CATHETERIZATION WITH CORONARY ANGIOGRAM;  Surgeon: Dolores Patty, MD;  Location: [facility] CATH LAB;  Service: Cardiovascular;  Laterality: N/A;  . Percutaneous coronary stent intervention (pci-s) N/A 07/23/2012    Procedure: PERCUTANEOUS CORONARY STENT INTERVENTION (PCI-S);  Surgeon: Tonny Bollman, MD;  Location: [facility] CATH LAB;  Service: Cardiovascular;  Laterality: N/A;  . Bi-ventricular  implantable cardioverter defibrillator N/A 07/06/2014    Procedure: BI-VENTRICULAR IMPLANTABLE CARDIOVERTER DEFIBRILLATOR  (CRT-D);  Surgeon: Duke Salvia, MD;  Location: [facility] CATH LAB;  Service: Cardiovascular;  Laterality: N/A;  . Colonoscopy with propofol Left 02/22/2015    Procedure: COLONOSCOPY WITH PROPOFOL;  Surgeon: Wallace Cullens, MD;  Location: [facility] ENDOSCOPY;  Service: Endoscopy;  Laterality: Left;  . Esophagogastroduodenoscopy N/A 02/22/2015    Procedure: ESOPHAGOGASTRODUODENOSCOPY (EGD);  Surgeon: Wallace Cullens, MD;  Location: [facility] ENDOSCOPY;  Service: Endoscopy;  Laterality: N/A;  . Peripheral vascular catheterization N/A 02/10/2015    Procedure: Picc Line Insertion;  Surgeon: Renford Dills, MD;  Location: [facility] INVASIVE CV LAB;  Service: Cardiovascular;  Laterality: N/A;  . Amputation toe Right 06/18/2015    Procedure: AMPUTATION TOE;  Surgeon: Gwyneth Revels, DPM;  Location: [facility] ORS;  Service: Podiatry;  Laterality: Right;  . Vaginal hysterectomy  01/2011    Fibroids/DUB.  Ovaries removed. [place].   No Known Allergies  Social History   Social History  . Marital Status: Married    Spouse Name: N/A  . Number of Children: 2  . Years of Education: 12   Occupational History  . disabled     03/2010 for peripheral neuropathy  . home daycare     x 20 yrs.   Social History Main Topics  . Smoking status: Never Smoker   . Smokeless tobacco: Never Used  . Alcohol Use: No  . Drug Use: No  . Sexual Activity: No   Other  Topics Concern  . Not on file   Social History Narrative   Marital status:Married x 42 yrs. Happily married, no abuse.       Children:  2 children (daughter 72, son 1). One grandson and 2 step grandchildren. Daughter and grandson live in home with pt and spouse.       Lives: with husband.      Employment: disability for peripheral neuropathy 2012.  Previously had home daycare.      Tobacco: none      Alcohol: none      Drugs: none      Exercise: none.  [employer].      Pets: dog.      Always uses seat belts, smoke detectors in home.      No guns in the home.       Caffeine use: 2 cups coffee per day.       Nutrition: Well balanced diet.   Family History  Problem Relation Age of Onset  . Diabetes Mother   . Hypertension Mother   . Arthritis Mother     knees, lumbar DDD, cervical DDD  . Cancer Father     prostate,skin,lymphoma.  . Obesity Brother   . Diabetes Maternal Grandmother   . Diabetes Paternal Grandmother        Objective:    BP 133/75 mmHg  Pulse 69  Temp(Src) 98 F (36.7 C) (Oral)  Resp 18  Ht 5' 7.5" (1.715 m)  Wt 255 lb 6.4 oz (115.849 kg)  BMI 39.39 kg/m2  SpO2 97% Physical Exam  Constitutional: She is oriented to person, place, and time. She appears well-developed and well-nourished. No distress.  HENT:  Head: Normocephalic and atraumatic.  Right Ear: External ear normal.  Left Ear: External ear normal.  Nose: Nose normal.  Mouth/Throat: Oropharynx is clear and moist.  Eyes: Conjunctivae and EOM are normal. Pupils are equal, round, and reactive to light.  Neck: Normal range of motion.  Neck supple. Carotid bruit is not present. No thyromegaly present.  Cardiovascular: Normal rate, regular rhythm, normal heart sounds and intact distal pulses.  Exam reveals no gallop and no friction rub.   No murmur heard. Pulmonary/Chest: Effort normal and breath sounds normal. She has no wheezes. She has no rales.  Abdominal: Soft. Bowel sounds are normal.  She exhibits no distension and no mass. There is no tenderness. There is no rebound and no guarding.  Lymphadenopathy:    She has no cervical adenopathy.  Neurological: She is alert and oriented to person, place, and time. No cranial nerve deficit. She exhibits abnormal muscle tone. Coordination normal.  Skin: Skin is warm and dry. No rash noted. She is not diaphoretic. No erythema. No pallor.  Psychiatric: She has a normal mood and affect. Her behavior is normal. Judgment and thought content normal.        Assessment & Plan:   1. 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 Coronary artery disease involving native coronary artery of native heart without angina pectoris   3. Pure hypercholesterolemia   4. Other specified transient cerebral ischemias   5. Diabetic peripheral neuropathy associated with type 2 diabetes mellitus (HCC)   6. Thyroid nodule     Orders Placed This Encounter  Procedures  . US Thyroid Biopsy    Standing Status: Future     Number of Occurrences:      Standing Expiration Date: 01/14/2017    Order Specific Question:  Reason for Exam (SYMPTOM  OR DIAGNOSIS REQUIRED)    Answer:  L thyroid nodule 2.2cm diameter detected on carotid doppler    Order Specific Question:  Preferred imaging location?    Answer:  [facility]  . US Soft Tissue Head/Neck    260 lbs / no needs / health team  [employer] / pt / mdc/ faxed order     Standing Status: Future     Number of Occurrences: 1     Standing Expiration Date: 01/24/2017    Order Specific Question:  Reason for Exam (SYMPTOM  OR DIAGNOSIS REQUIRED)    Answer:  L thyroid nodule 2.2 cm detected on carotid dopplers    Order Specific Question:  Preferred imaging location?    Answer:  [facility]  . CBC with Differential/Platelet  . Comprehensive metabolic panel  . TSH  . T4, free   Meds ordered this encounter  Medications  . pravastatin (PRAVACHOL) 40 MG tablet    Sig: Take 1  tablet (40 mg total) by mouth at bedtime.    Dispense:  90 tablet    Refill:  3    Return in about 4 weeks (around 12/15/2015) for recheck.    Akyra Bouchie Paulita Fujita, M.D. [facility] [address] [place], [place]  [address] [phone] phone [phone] fax

## 2015-11-21 DIAGNOSIS — I110 Hypertensive heart disease with heart failure: Secondary | ICD-10-CM | POA: Diagnosis not present

## 2015-11-21 DIAGNOSIS — D649 Anemia, unspecified: Secondary | ICD-10-CM | POA: Diagnosis not present

## 2015-11-21 DIAGNOSIS — Z950 Presence of cardiac pacemaker: Secondary | ICD-10-CM | POA: Diagnosis not present

## 2015-11-21 DIAGNOSIS — E1140 Type 2 diabetes mellitus with diabetic neuropathy, unspecified: Secondary | ICD-10-CM | POA: Diagnosis not present

## 2015-11-21 DIAGNOSIS — I5022 Chronic systolic (congestive) heart failure: Secondary | ICD-10-CM | POA: Diagnosis not present

## 2015-11-21 DIAGNOSIS — Z794 Long term (current) use of insulin: Secondary | ICD-10-CM | POA: Diagnosis not present

## 2015-11-21 DIAGNOSIS — G4733 Obstructive sleep apnea (adult) (pediatric): Secondary | ICD-10-CM | POA: Diagnosis not present

## 2015-11-21 DIAGNOSIS — E559 Vitamin D deficiency, unspecified: Secondary | ICD-10-CM | POA: Diagnosis not present

## 2015-11-21 DIAGNOSIS — I2510 Atherosclerotic heart disease of native coronary artery without angina pectoris: Secondary | ICD-10-CM | POA: Diagnosis not present

## 2015-11-21 DIAGNOSIS — Z7984 Long term (current) use of oral hypoglycemic drugs: Secondary | ICD-10-CM | POA: Diagnosis not present

## 2015-11-21 DIAGNOSIS — Z7982 Long term (current) use of aspirin: Secondary | ICD-10-CM | POA: Diagnosis not present

## 2015-11-21 DIAGNOSIS — I69354 Hemiplegia and hemiparesis following cerebral infarction affecting left non-dominant side: Secondary | ICD-10-CM | POA: Diagnosis not present

## 2015-11-21 DIAGNOSIS — E669 Obesity, unspecified: Secondary | ICD-10-CM | POA: Diagnosis not present

## 2015-11-24 ENCOUNTER — Encounter: Payer: Self-pay | Admitting: Neurology

## 2015-11-24 ENCOUNTER — Ambulatory Visit (INDEPENDENT_AMBULATORY_CARE_PROVIDER_SITE_OTHER): Payer: PPO | Admitting: Neurology

## 2015-11-24 VITALS — BP 140/70 | HR 70 | Ht 67.5 in | Wt 260.2 lb

## 2015-11-24 DIAGNOSIS — G8332 Monoplegia, unspecified affecting left dominant side: Secondary | ICD-10-CM | POA: Diagnosis not present

## 2015-11-24 DIAGNOSIS — I639 Cerebral infarction, unspecified: Secondary | ICD-10-CM

## 2015-11-24 DIAGNOSIS — E0842 Diabetes mellitus due to underlying condition with diabetic polyneuropathy: Secondary | ICD-10-CM | POA: Diagnosis not present

## 2015-11-24 NOTE — Patient Instructions (Addendum)
1.  CT angiogram head 2.  Continue plavix and aspirin 3.  Continue pravastatin 4.  Continue physical therapy 5.  I'll see you back at your schedule appointment in July

## 2015-11-24 NOTE — Progress Notes (Signed)
Follow-up Visit   Date: 11/24/2015    [name] MRN: [id number] DOB: 25-Mar-1956   Interim History: [name] is a 60 y.o. right-handed Caucasian female with diabetes mellitus type 2 since age of 55 complicated by neuropathy, left leg diabetic amyotrophy (2011), and CHF s/p ICD returning to the clinic for follow-up of subacute ischemic infarct.  The patient was accompanied to the clinic by husband who also provides collateral information.    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 At that time, she was under the care of Dr. Sandria Manly at [facility], who has since retired. Hemoglobin A1c in 2011 was noted to be 15 and she had associated loss of 130 pounds in the preceding year. Slowly, she developed left knee buckling and leg weakness which progressed to where she had a fall and had to be hospitalized. She has had persistent numbness involving her left lower extremity. Imaging at that time showed multilevel spondylosis as well as postsurgical L4-5 and L5-S1 left hemilaminectomy and discectomy. Electrodiagnostic showed profound denervation involving the vastus medialis and iliopsoas muscles in addition to sensory abnormalities, suggesting diabetic amyotrophy. She underwent treatment with IVIG and was ultimately discharged to rehabilitation facility. Over the next 8 months, she continued to do rehabilitation and was eventually able to walk independently again, but still using a walker as needed.  Patient was reportedly doing well until February 2016 when she slowly started developing painless left foot drop and bilateral proximal leg weakness. Prior to February, she was walking independently, but now is dependent on a walk, and unable to make transfers independently. She has functionally dependent on her family members to transfer out of chairs, off the commode, as well as into and out of a  car. She is able to bathe and dress herself. He has not had any falls but is very cautious when walking. She endorses difficulty with climbing stairs and walking. She has residual numbness of the left leg.  In mid-April, she developed weakness of the left last two fingers, where she is unable to extend the 4th and 5th digits on the left. She has difficulty gripping things with her left hand. There is no associated numbness/tingling of the hand.   UPDATE 06/15/2015:  Patient has underwent a battery of tests since her last visit including serum, CSF testing, and EMGs.  There was evidence of sensorimotor neuropathy as well as polyradiculoneuropathy.  CSF testing showed slightly elevated protein, otherwise was normal.  More notable was that her inflammatory markers, ANA, and p-ANCA returned elevated.  She saw rheumatology who ordered additional testing, but patient is unaware of these results and did not have a return visit.  She has been undergoing hyperbaric oxygen therapy for her wound which has helped left heel ulcer, but now she has right toe ulcer. In doing this, she was also started on insulin for close blood glucose control.   The strength of her legs has significantly improved and over the past month, she has been walking independently.  Her left hand remains contracted which limits her ability to use it.  UPDATE 10/15/2014:  She has continued to see improvement in her motor strength.  She has been driving since October and able to control the pedal without difficulty.  She had residual left foot drop from 2011, but overall feels back to herself.  She can still tire with prolonged activity. No interval falls and no new complaints.  She is requesting handicap placard for parking.  UPDATE 11/24/2015:  Patient had in interval hospitalization for stroke manifesting with left leg>arm/face numbness and weakness (NIHSS 2) on 2/4.  CT showed a small right centrum semiovale infarct.  Intracranial imaging was not  performed and her echo was suboptimal.  US carotids showed <50% stenosis bilaterally.  Medical management was optimized by adding plavix 75mg  to aspirin 81mg  which she was already taking.  Statin therapy was added by her PCP.  She no longer has numbness of the face and arm, but has mild residual numbness of the left leg and foot.  She is also dragging the left foot more than before.  She is doing PT and has an appointment today to be fit for AFO.  Medications:  Current Outpatient Prescriptions on File Prior to Visit  Medication Sig Dispense Refill  . AMBULATORY NON FORMULARY MEDICATION 1 Units by Other route once. Medication Name: foot brace 1 Units 0  . aspirin EC 81 MG tablet Take 1 tablet (81 mg total) by mouth daily. 90 tablet 3  . Calcium Carb-Cholecalciferol (CALCIUM 600 + D PO) Take 1 tablet by mouth 2 (two) times daily.    . carvedilol (COREG) 3.125 MG tablet Take 1 tablet (3.125 mg total) by mouth 2 (two) times daily. 60 tablet 6  . clopidogrel (PLAVIX) 75 MG tablet Take 1 tablet (75 mg total) by mouth daily. 30 tablet 0  . gabapentin (NEURONTIN) 600 MG tablet TAKE 1 TABLET BY MOUTH 3 TIMES DAILY 270 tablet 0  . glimepiride (AMARYL) 4 MG tablet Take 1 tablet (4 mg total) by mouth 2 (two) times daily with a meal. 30 tablet 3  . Insulin Glargine (LANTUS) 100 UNIT/ML Solostar Pen Inject 10 Units into the skin daily at 10 pm. (Patient taking differently: Inject 12-17 Units into the skin at bedtime. ) 15 mL 11  . isosorbide mononitrate (IMDUR) 30 MG 24 hr tablet Take 1 tablet (30 mg total) by mouth daily. 30 tablet 6  . MULTIPLE VITAMIN PO Take 1 tablet by mouth every morning.     . nitroGLYCERIN (NITROSTAT) 0.4 MG SL tablet Place 0.4 mg under the tongue every 5 (five) minutes as needed for chest pain.    . Omega-3 Fatty Acids (FISH OIL) 1000 MG CAPS Take 1,000 mg by mouth 2 (two) times daily.     . pravastatin (PRAVACHOL) 40 MG tablet Take 1 tablet (40 mg total) by mouth at bedtime. 90 tablet 3   . torsemide (DEMADEX) 20 MG tablet Take 1 tablet (20 mg total) by mouth daily. 30 tablet 11  . traMADol (ULTRAM) 50 MG tablet TAKE 1 TABLET EVERY 6 HOURS AS NEEDED FOR PAIN 120 tablet 5   No current facility-administered medications on file prior to visit.    Allergies: No Known Allergies  Review of Systems:  CONSTITUTIONAL: No fevers, chills, night sweats, or weight loss.  EYES: No visual changes or eye pain ENT: No hearing changes.  No history of nose bleeds.   RESPIRATORY: No cough, wheezing and shortness of breath.   CARDIOVASCULAR: Negative for chest pain, and palpitations.   GI: Negative for abdominal discomfort, blood in stools or black stools.  No recent change in bowel habits.   GU:  No history of incontinence.   MUSCLOSKELETAL: +history of joint pain or swelling.  No myalgias.   SKIN: Negative for lesions, rash, and itching.   ENDOCRINE: Negative for cold or heat intolerance, polydipsia or goiter.   PSYCH:  No depression or anxiety symptoms.   NEURO: As Above.   Vital Signs:  BP 140/70 mmHg  Pulse 70  Ht 5' 7.5" (1.715 m)  Wt 260 lb 3 oz (118.02 kg)  BMI 40.13 kg/m2  SpO2 97%  Gen:  Comfortable  CV:  Regular rate and ryhythm Pulm:  Clear to auscultation Ext:  No edema  Neurological Exam: MENTAL STATUS including orientation to time, place, person, recent and remote memory, attention span and concentration, language, and fund of knowledge is normal.  Speech is not dysarthric.  CRANIAL NERVES:  Pupils equal round and reactive to light.  Normal conjugate, extra-ocular eye movements in all directions of gaze.  No ptosis.  Face is symmetric. Palate elevates symmetrically.  Tongue is midline.    MOTOR:  Left TA atrophy, fasciculations or abnormal movements.  No pronator drift.  Tone is normal.    Right Upper Extremity:    Left Upper Extremity:    Deltoid  5/5   Deltoid  5/5   Biceps  5/5   Biceps  5/5   Triceps  5/5   Triceps  5/5   Wrist extensors  5/5   Wrist  extensors  5/5   Wrist flexors  5/5   Wrist flexors  5/5   Finger extensors  5/5   Finger extensors * Limited by flexion contracture of the 3-5th digits at the MCP and PIP 4+/5   Finger flexors  5/5   Finger flexors  5/5   Dorsal interossei  5/5   Dorsal interossei  5/5   Abductor pollicis  5/5   Abductor pollicis  5/5   Tone (Ashworth scale)  0  Tone (Ashworth scale)  0   Right Lower Extremity:    Left Lower Extremity:    Hip flexors  5/5   Hip flexors  5/5   Hip extensors  5/5   Hip extensors  5/5   Knee flexors  5/5   Knee flexors  5/5   Knee extensors  5/5   Knee extensors  5/5   Dorsiflexors  5/5   Dorsiflexors  2/5   Plantarflexors  5/5   Plantarflexors  3/5   Toe extensors  5/5   Toe extensors  2/5   Toe flexors  5/5   Toe flexors  2/5   Tone (Ashworth scale)  0  Tone (Ashworth scale)  0    MSRs:  Right                                                                 Left brachioradialis 2+  brachioradialis 2+  biceps 2+  biceps 2+  triceps 1+  triceps 1+  patellar trace  patellar 0  ankle jerk 0  ankle jerk 0  Hoffman no  Hoffman no  plantar response down  plantar response down   SENSORY: Vibration and temperature absent at ankles bilaterally, intact at knees.  Light touch is reduced on the left lower leg.   Sensation intact in the upper extremities.  No neglect or extinction.  COORDINATION/GAIT:  Left foot drop with high steppage on the left, otherwise walking independently and appears stable  Data: MRI lumbar spine 05/17/2010: 1. Unchanged MRI from 03/22/2010. No epidural abscess is identified. No compression fracture or acute abnormality.  2. Postoperative changes on the left at L4-L5 and L5-S1. 3. Unchanged left paracentral and posterolateral shallow disc protrusion at L5-S1 potentially affecting the descending left S1 nerve root.  MRI brain wo contrast 05/17/2010: Small white matter lesions in the frontal lobes bilaterally. These are likely due to chronic  microvascular ischemia. Demyelinating disease is not considered likely in this case. No acute abnormality.  EMG of the right lower extremity 03/28/2015: The electrophysiologic findings are most consistent with a severe generalized sensorimotor polyneuropathy, axon loss in type, affecting the right lower extremity. A superimposed multilevel intraspinal canal lesion (i.e. radiculopathy) cannot be excluded.  EMG of the upper extremities 03/21/2015:  The electrophysiologic findings are most consistent with a severe subacute sensorimotor polyradiculoneuropathy affecting the upper extremities  Labs 01/22/2015:  SPEP/UPEP with IFE no M protein, ANA 1:640* homogenous pattern, P-ANCA 1:640*, RF < 10, ACE 46, copper 131, ceruloplasmin 31, ESR 65, vitamin B1 17, CK 24, ENA negative, CRP 15.2*, cryoglobulins none detected, aldolase 8.0  CSF 04/28/2015:  W1  R0  G73  P63*   Cryptococcus neg, ACE 11, one OCB in CSF and serum, MBP <2.0, Lyme neg, IgG index 0.45  Lab Results  Component Value Date   HGBA1C 7.5* 11/11/2015   US carotids 11/24/2015: Mild atherosclerotic disease in the carotid arteries bilaterally. Estimated degree of stenosis in the internal carotid arteries is less than 50% bilaterally.  Patent vertebral arteries.  Left thyroid nodule measuring up to 2.2 cm. This nodule meets criteria for biopsy. Recommend dedicated ultrasound of the thyroid to look for additional nodules and consider biopsy of the dominant left thyroid nodule.   CT head wo contrast 11/11/2015: Small rounded focal low density seen in right centrum semiovale concerning for focal infarction of indeterminate age. These results were called by telephone at the time of interpretation on 11/11/2015 at 9:49 am to Dr. Jene Every , who verbally acknowledged these results.  Lab Results  Component Value Date   CHOL 187 11/11/2015   HDL 48 11/11/2015   LDLCALC 120* 11/11/2015   TRIG 94 11/11/2015   CHOLHDL 3.9 11/11/2015     IMPRESSION/PLAN: 1.  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    - I will also reach out to Dr. Graciela Husbands, her cardiologist to interrogate the device to see if there is any atrial fibrillation which could have caused cardioembolic stroke.  Her last device check was prior to symptom onset  - In the meantime, continue secondary stroke prevention with aspirin 81mg , plavix 75mg  daily, statin therapy, BP is well controlled  2.  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  Greater than 50% of this time was spent in counseling, explanation of diagnosis, planning of further management, and coordination of care.   Thank you for allowing me to participate in patient's care.  If I can  answer any additional questions, I would be pleased to do so.    Sincerely,    Samuel Rittenhouse K. Allena Katz, DO

## 2015-11-27 ENCOUNTER — Encounter: Payer: Self-pay | Admitting: Family Medicine

## 2015-11-27 ENCOUNTER — Encounter: Payer: Self-pay | Admitting: Cardiovascular Disease

## 2015-11-27 ENCOUNTER — Ambulatory Visit: Payer: PPO | Admitting: Family Medicine

## 2015-11-27 ENCOUNTER — Ambulatory Visit (INDEPENDENT_AMBULATORY_CARE_PROVIDER_SITE_OTHER): Payer: PPO | Admitting: Cardiovascular Disease

## 2015-11-27 VITALS — BP 118/64 | HR 75 | Ht 68.0 in | Wt 261.2 lb

## 2015-11-27 DIAGNOSIS — Z7984 Long term (current) use of oral hypoglycemic drugs: Secondary | ICD-10-CM | POA: Diagnosis not present

## 2015-11-27 DIAGNOSIS — I69354 Hemiplegia and hemiparesis following cerebral infarction affecting left non-dominant side: Secondary | ICD-10-CM | POA: Diagnosis not present

## 2015-11-27 DIAGNOSIS — E669 Obesity, unspecified: Secondary | ICD-10-CM | POA: Diagnosis not present

## 2015-11-27 DIAGNOSIS — Z794 Long term (current) use of insulin: Secondary | ICD-10-CM | POA: Diagnosis not present

## 2015-11-27 DIAGNOSIS — I519 Heart disease, unspecified: Secondary | ICD-10-CM | POA: Diagnosis not present

## 2015-11-27 DIAGNOSIS — I10 Essential (primary) hypertension: Secondary | ICD-10-CM | POA: Diagnosis not present

## 2015-11-27 DIAGNOSIS — E1140 Type 2 diabetes mellitus with diabetic neuropathy, unspecified: Secondary | ICD-10-CM | POA: Diagnosis not present

## 2015-11-27 DIAGNOSIS — I2510 Atherosclerotic heart disease of native coronary artery without angina pectoris: Secondary | ICD-10-CM | POA: Diagnosis not present

## 2015-11-27 DIAGNOSIS — I447 Left bundle-branch block, unspecified: Secondary | ICD-10-CM

## 2015-11-27 DIAGNOSIS — E559 Vitamin D deficiency, unspecified: Secondary | ICD-10-CM | POA: Diagnosis not present

## 2015-11-27 DIAGNOSIS — I5022 Chronic systolic (congestive) heart failure: Secondary | ICD-10-CM | POA: Diagnosis not present

## 2015-11-27 DIAGNOSIS — D649 Anemia, unspecified: Secondary | ICD-10-CM | POA: Diagnosis not present

## 2015-11-27 DIAGNOSIS — G4733 Obstructive sleep apnea (adult) (pediatric): Secondary | ICD-10-CM | POA: Diagnosis not present

## 2015-11-27 DIAGNOSIS — Z950 Presence of cardiac pacemaker: Secondary | ICD-10-CM | POA: Diagnosis not present

## 2015-11-27 DIAGNOSIS — I110 Hypertensive heart disease with heart failure: Secondary | ICD-10-CM | POA: Diagnosis not present

## 2015-11-27 DIAGNOSIS — Z7982 Long term (current) use of aspirin: Secondary | ICD-10-CM | POA: Diagnosis not present

## 2015-11-27 NOTE — Progress Notes (Signed)
Patient ID: [name], female   DOB: 12/21/1955, 60 y.o.   MRN: [id number]    HPI:  [name] is a 60 y.o. female with past medical history of chronic systolic heart failure, coronary artery disease with previous LAD stenting in 2013, HL, morbid obesity, chronic pain and DM since she was 60 years old with chronic kidney disease. She had neuropathy in 2011 causing her to be in a wheelchair, this recovered after 8 months of therapy.  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    Previous ejection fraction in 2011 was 20%. Status post Medtronic CRT-D implantation 9/15.  She has been unable to tolerate spironolactone or lisinopril due to hyperkalemia.  She was hospitalized in May,2016 at [facility] for symptomatic anemia. Hemoglobin was 7.3.  This improved with IV iron. Peripheral arterial dopplers 8/15 with normal ABIs bilaterally  Sleep study 11/15 with severe OSA  Lexiscan Cardiolite in 3/16 with EF 50%, small apical infarct with mild peri-infarct ischemia, low risk study.   Most recent echocardiogram in January 2017 showed an ejection fraction of 50%. She was hospitalized early this month with a small stroke thought to be due to small vessel disease. She has been doing well otherwise and denies chest pain or significant dyspnea.   ROS: All systems negative except as listed in HPI, PMH and Problem List.  Past Medical History  Diagnosis Date  . Obesity, unspecified   . Unspecified vitamin D deficiency   . Pure hypercholesterolemia   . Neuropathy ([place]) 2011  . Chicken pox   . Chronic systolic CHF (congestive heart failure) (HCC)     a. mixed ICM & NICM; b. EF 20-25% by echo 07/2012, mid-dist 2/3 of LV sev HK/AK, mild MR. echo 10/2012: EF 30-35%, sev HK ant-septal & inf walls, GR1DD, mild MR, PASP 33. c. echo 02/2013: EF 30%, GR1DD, mild MR. echo 04/2014: EF 30%, Septal-lat dyssynchrony, global HK, inf AK, GR1DD,  mild MR. d. echo 10/2014: EF50-55%, WM nl, GR1DD, septal mild paradox. e. echo 02/2015: EF 50-55%, wm   . Hypertension   . Anemia   . Automatic implantable cardioverter-defibrillator in situ     a. MDT CRT-D 06/2014, [id number]    . Type II diabetes mellitus ([place])   . CAD (coronary artery disease)     a. cardiac cath 101/04/2012: PCI/DES to chronically occluded mLAD, consideration PCI to diag branch in 4 weeks.   Marland Kitchen History of blood transfusion ~ 2011    "plasma; had neuropathy; couldn't walk"  . OSA on CPAP     Moderate with AHI 23/hr and now on CPAP at 16cm H2O.  Her DME is [facility]  . LBBB (left bundle branch block)   . Heart attack [facility])     Current Outpatient Prescriptions  Medication Sig Dispense Refill  . AMBULATORY NON FORMULARY MEDICATION 1 Units by Other route once. Medication Name: foot brace 1 Units 0  . aspirin EC 81 MG tablet Take 1 tablet (81 mg total) by mouth daily. 90 tablet 3  . Calcium Carb-Cholecalciferol (CALCIUM 600 + D PO) Take 1 tablet by mouth 2 (two) times daily.    . carvedilol (COREG) 3.125 MG tablet Take 1 tablet (3.125 mg total) by mouth 2 (two) times daily. 60 tablet 6  . clopidogrel (PLAVIX) 75 MG tablet Take 1 tablet (75 mg total) by mouth daily. 30 tablet 0  . gabapentin (NEURONTIN) 600 MG tablet TAKE 1 TABLET  BY MOUTH 3 TIMES DAILY 270 tablet 0  . glimepiride (AMARYL) 4 MG tablet Take 1 tablet (4 mg total) by mouth 2 (two) times daily with a meal. 30 tablet 3  . Insulin Glargine (LANTUS) 100 UNIT/ML Solostar Pen Inject 10 Units into the skin daily at 10 pm. (Patient taking differently: Inject 12-17 Units into the skin at bedtime. ) 15 mL 11  . isosorbide mononitrate (IMDUR) 30 MG 24 hr tablet Take 1 tablet (30 mg total) by mouth daily. 30 tablet 6  . MULTIPLE VITAMIN PO Take 1 tablet by mouth every morning.     . nitroGLYCERIN (NITROSTAT) 0.4 MG SL tablet Place 0.4 mg under the tongue every 5 (five) minutes as needed for chest pain.    . Omega-3 Fatty Acids  (FISH OIL) 1000 MG CAPS Take 1,000 mg by mouth 2 (two) times daily.     . pravastatin (PRAVACHOL) 40 MG tablet Take 1 tablet (40 mg total) by mouth at bedtime. 90 tablet 3  . torsemide (DEMADEX) 20 MG tablet Take 1 tablet (20 mg total) by mouth daily. 30 tablet 11  . traMADol (ULTRAM) 50 MG tablet TAKE 1 TABLET EVERY 6 HOURS AS NEEDED FOR PAIN 120 tablet 5   No current facility-administered medications for this visit.    Filed Vitals:   11/27/15 1133  BP: 118/64  Pulse: 75  Height: 5\' 8"  (1.727 m)  Weight: 261 lb 4 oz (118.502 kg)   PHYSICAL EXAM: General: Obese, no resp difficulty. HEENT: normal  Neck: supple. JVP not elevated. Carotids 2+ bilat; no bruits. No lymphadenopathy or thryomegaly appreciated.  Cor: PMI nondisplaced. Regular rate & rhythm. No S3. Lungs: Slight crackles at bases.   Abdomen: obese, soft, nontender, nondistended. No hepatosplenomegaly. No bruits or masses. Good bowel sounds.  Extremities: no cyanosis, clubbing, rash.  Trace lower legs bilaterally.  Neuro: alert & orientedx3  EKG: Atrial sensed ventricular paced rhythm.  ASSESSMENT & PLAN:  1) Chronic systolic HF: Ischemic cardiomyopathy . Most recent ejection fraction was 50%.  s/p Medtronic CRT-D.   NYHA class 2-3 symptoms . Symptoms improved after treating anemia - She has not been on ACEI or spironolactone due to severe hyperkalemia and CKD She is tolerating small dose carvedilol.     2) CAD: S/p complex LAD PCI 07/2012. Lexiscan Cardiolite in 3/16 was low risk.  No recent chest pain.  - Continue dual antiplatelet therapy.  3) Hyperlipidemia: On pravastatin.. Followed by PCP. Recommend a target LDL of less than 70.    [name] 11/27/2015

## 2015-11-27 NOTE — Patient Instructions (Signed)
Medication Instructions: Continue same medications.   Labwork: None.   Procedures/Testing: None.   Follow-Up: 6 months with Dr. Devery Murgia.   Any Additional Special Instructions Will Be Listed Below (If Applicable).     If you need a refill on your cardiac medications before your next appointment, please call your pharmacy.   

## 2015-11-28 DIAGNOSIS — E113293 Type 2 diabetes mellitus with mild nonproliferative diabetic retinopathy without macular edema, bilateral: Secondary | ICD-10-CM | POA: Diagnosis not present

## 2015-11-28 DIAGNOSIS — H43813 Vitreous degeneration, bilateral: Secondary | ICD-10-CM | POA: Diagnosis not present

## 2015-11-30 DIAGNOSIS — E1140 Type 2 diabetes mellitus with diabetic neuropathy, unspecified: Secondary | ICD-10-CM | POA: Diagnosis not present

## 2015-11-30 DIAGNOSIS — Z7982 Long term (current) use of aspirin: Secondary | ICD-10-CM | POA: Diagnosis not present

## 2015-11-30 DIAGNOSIS — I69354 Hemiplegia and hemiparesis following cerebral infarction affecting left non-dominant side: Secondary | ICD-10-CM | POA: Diagnosis not present

## 2015-11-30 DIAGNOSIS — I2510 Atherosclerotic heart disease of native coronary artery without angina pectoris: Secondary | ICD-10-CM | POA: Diagnosis not present

## 2015-11-30 DIAGNOSIS — Z950 Presence of cardiac pacemaker: Secondary | ICD-10-CM | POA: Diagnosis not present

## 2015-11-30 DIAGNOSIS — E559 Vitamin D deficiency, unspecified: Secondary | ICD-10-CM | POA: Diagnosis not present

## 2015-11-30 DIAGNOSIS — D649 Anemia, unspecified: Secondary | ICD-10-CM | POA: Diagnosis not present

## 2015-11-30 DIAGNOSIS — Z7984 Long term (current) use of oral hypoglycemic drugs: Secondary | ICD-10-CM | POA: Diagnosis not present

## 2015-11-30 DIAGNOSIS — I110 Hypertensive heart disease with heart failure: Secondary | ICD-10-CM | POA: Diagnosis not present

## 2015-11-30 DIAGNOSIS — I5022 Chronic systolic (congestive) heart failure: Secondary | ICD-10-CM | POA: Diagnosis not present

## 2015-11-30 DIAGNOSIS — Z794 Long term (current) use of insulin: Secondary | ICD-10-CM | POA: Diagnosis not present

## 2015-11-30 DIAGNOSIS — G4733 Obstructive sleep apnea (adult) (pediatric): Secondary | ICD-10-CM | POA: Diagnosis not present

## 2015-11-30 DIAGNOSIS — E669 Obesity, unspecified: Secondary | ICD-10-CM | POA: Diagnosis not present

## 2015-12-05 ENCOUNTER — Ambulatory Visit
Admission: RE | Admit: 2015-12-05 | Discharge: 2015-12-05 | Disposition: A | Discharge status: Home / Self Care | Payer: PPO | Source: Ambulatory Visit | Attending: Family Medicine | Admitting: Family Medicine

## 2015-12-05 DIAGNOSIS — E041 Nontoxic single thyroid nodule: Secondary | ICD-10-CM

## 2015-12-05 DIAGNOSIS — G4733 Obstructive sleep apnea (adult) (pediatric): Secondary | ICD-10-CM | POA: Diagnosis not present

## 2015-12-05 DIAGNOSIS — E042 Nontoxic multinodular goiter: Secondary | ICD-10-CM | POA: Diagnosis not present

## 2015-12-06 ENCOUNTER — Telehealth: Payer: Self-pay | Admitting: *Deleted

## 2015-12-06 ENCOUNTER — Other Ambulatory Visit: Payer: Self-pay | Admitting: *Deleted

## 2015-12-06 DIAGNOSIS — G8332 Monoplegia, unspecified affecting left dominant side: Secondary | ICD-10-CM

## 2015-12-06 DIAGNOSIS — I639 Cerebral infarction, unspecified: Secondary | ICD-10-CM

## 2015-12-06 DIAGNOSIS — G8220 Paraplegia, unspecified: Secondary | ICD-10-CM

## 2015-12-06 NOTE — Telephone Encounter (Signed)
Left message for patient to call me back.  CTA cancelled due to elevated labs.  MRA scheduled instead for March 10 at 5:50. (arrival time is 5:30)

## 2015-12-06 NOTE — Telephone Encounter (Signed)
Patient informed of new appointment time, date and location.

## 2015-12-07 ENCOUNTER — Inpatient Hospital Stay: Admission: RE | Admit: 2015-12-07 | Payer: PPO | Source: Ambulatory Visit

## 2015-12-12 ENCOUNTER — Ambulatory Visit: Payer: PPO | Admitting: Family Medicine

## 2015-12-12 ENCOUNTER — Ambulatory Visit (INDEPENDENT_AMBULATORY_CARE_PROVIDER_SITE_OTHER): Payer: PPO | Admitting: Family Medicine

## 2015-12-12 ENCOUNTER — Encounter: Payer: Self-pay | Admitting: Family Medicine

## 2015-12-12 VITALS — BP 161/76 | HR 78 | Temp 98.3°F | Resp 16 | Ht 68.0 in | Wt 260.0 lb

## 2015-12-12 DIAGNOSIS — N289 Disorder of kidney and ureter, unspecified: Secondary | ICD-10-CM | POA: Diagnosis not present

## 2015-12-12 DIAGNOSIS — E7800 Pure hypercholesterolemia, unspecified: Secondary | ICD-10-CM

## 2015-12-12 DIAGNOSIS — I63419 Cerebral infarction due to embolism of unspecified middle cerebral artery: Secondary | ICD-10-CM | POA: Diagnosis not present

## 2015-12-12 DIAGNOSIS — D509 Iron deficiency anemia, unspecified: Secondary | ICD-10-CM | POA: Diagnosis not present

## 2015-12-12 DIAGNOSIS — E1142 Type 2 diabetes mellitus with diabetic polyneuropathy: Secondary | ICD-10-CM

## 2015-12-12 LAB — POCT URINALYSIS DIP (MANUAL ENTRY)
BILIRUBIN UA: NEGATIVE
GLUCOSE UA: NEGATIVE
Leukocytes, UA: NEGATIVE
NITRITE UA: NEGATIVE
Protein Ur, POC: 30 — AB
Spec Grav, UA: 1.02
Urobilinogen, UA: 0.2
pH, UA: 6

## 2015-12-12 LAB — CBC WITH DIFFERENTIAL/PLATELET
BASOS PCT: 0 % (ref 0–1)
Basophils Absolute: 0 10*3/uL (ref 0.0–0.1)
EOS PCT: 2 % (ref 0–5)
Eosinophils Absolute: 0.1 10*3/uL (ref 0.0–0.7)
HEMATOCRIT: 35.6 % — AB (ref 36.0–46.0)
HEMOGLOBIN: 12.4 g/dL (ref 12.0–15.0)
LYMPHS PCT: 34 % (ref 12–46)
Lymphs Abs: 2.2 10*3/uL (ref 0.7–4.0)
MCH: 30.8 pg (ref 26.0–34.0)
MCHC: 34.8 g/dL (ref 30.0–36.0)
MCV: 88.3 fL (ref 78.0–100.0)
MONO ABS: 0.4 10*3/uL (ref 0.1–1.0)
MONOS PCT: 6 % (ref 3–12)
MPV: 9.6 fL (ref 8.6–12.4)
NEUTROS ABS: 3.8 10*3/uL (ref 1.7–7.7)
Neutrophils Relative %: 58 % (ref 43–77)
Platelets: 178 10*3/uL (ref 150–400)
RBC: 4.03 MIL/uL (ref 3.87–5.11)
RDW: 13.5 % (ref 11.5–15.5)
WBC: 6.5 10*3/uL (ref 4.0–10.5)

## 2015-12-12 MED ORDER — CLOPIDOGREL BISULFATE 75 MG PO TABS: 75.0000 mg | ORAL_TABLET | Freq: Every day | ORAL | Status: DC

## 2015-12-12 MED ORDER — INSULIN ASPART 100 UNIT/ML FLEXPEN: 5.0000 [IU] | PEN_INJECTOR | Freq: Three times a day (TID) | SUBCUTANEOUS | Status: DC

## 2015-12-12 MED ORDER — INSULIN GLARGINE 100 UNIT/ML SOLOSTAR PEN: 15.0000 [IU] | PEN_INJECTOR | Freq: Every day | SUBCUTANEOUS | Status: DC

## 2015-12-12 NOTE — Patient Instructions (Signed)
1.  Check blood pressure daily; call if blood pressure remains > 140/90. 2.  Check sugar three days in a row at 1:00pm.

## 2015-12-12 NOTE — Progress Notes (Signed)
Subjective:    Patient ID: [name], female    DOB: [birth year], 60 y.o.   MRN: [id number]  12/12/2015  Diabetes   HPI This 60 y.o. female presents for four month follow-up:   1.  TIA/CVA: no further n/t in L side.  No weakness from baseline on L.  Awaiting L foot brace. ASA and Plavix   2.  Hypercholesterolemia:  Restarted Pravastatin; doing fine on medication.  3.  DMII: Lantus 15 units for past two weeks.   Fasting sugars running 140-80.  Post-prandial sugars 180-300.  Eats 1-2 times per day. Lowest sugar 59 one morning.    4. CAD:  Patient reports good compliance with medication, good tolerance to medication, and good symptom control.    5. Peripheral neuropathy: Patient reports good compliance with medication, good tolerance to medication, and good symptom control.     Review of Systems  Constitutional: Negative for fever, chills, diaphoresis and fatigue.  Eyes: Negative for visual disturbance.  Respiratory: Negative for cough and shortness of breath.   Cardiovascular: Negative for chest pain, palpitations and leg swelling.  Gastrointestinal: Negative for nausea, vomiting, abdominal pain, diarrhea and constipation.  Endocrine: Negative for cold intolerance, heat intolerance, polydipsia, polyphagia and polyuria.  Neurological: Negative for dizziness, tremors, seizures, syncope, facial asymmetry, speech difficulty, weakness, light-headedness, numbness and headaches.    Past Medical History  Diagnosis Date  . Obesity, unspecified   . Unspecified vitamin D deficiency   . Pure hypercholesterolemia   . Neuropathy (HCC) 2011  . Chicken pox   . Chronic systolic CHF (congestive heart failure) (HCC)     a. mixed ICM & NICM; b. EF 20-25% by echo 07/2012, mid-dist 2/3 of LV sev HK/AK, mild MR. echo 10/2012: EF 30-35%, sev HK ant-septal & inf walls, GR1DD, mild MR, PASP 33. c. echo 02/2013: EF 30%, GR1DD, mild MR. echo 04/2014: EF 30%, Septal-lat dyssynchrony, global HK, inf AK,  GR1DD, mild MR. d. echo 10/2014: EF50-55%, WM nl, GR1DD, septal mild paradox. e. echo 02/2015: EF 50-55%, wm   . Hypertension   . Anemia   . Automatic implantable cardioverter-defibrillator in situ     a. MDT CRT-D 06/2014, SN: [id number]    . Type II diabetes mellitus (HCC)   . CAD (coronary artery disease)     a. cardiac cath 101/04/2012: PCI/DES to chronically occluded mLAD, consideration PCI to diag branch in 4 weeks.   Marland Kitchen History of blood transfusion ~ 2011    "plasma; had neuropathy; couldn't walk"  . OSA on CPAP     Moderate with AHI 23/hr and now on CPAP at 16cm H2O.  Her DME is [facility]  . LBBB (left bundle branch block)   . Heart attack [facility])    Past Surgical History  Procedure Laterality Date  . Lumbar disc surgery  1996    L4-5  . Incision and drainage abscess Right 2007    groin; with ICU stay due to sepsis.  . Bilateral oophorectomy  01/2011    ovarian cyst benign  . Bi-ventricular implantable cardioverter defibrillator  (crt-d)  07/06/2014  . Laparoscopic cholecystectomy  2011  . Back surgery    . Coronary angioplasty with stent placement Left 07/2012    new onset systolic CHF; elevated troponins.  Cardiac catheterization with stenting to LAD; EF 15%.  2D-echo: EF 20-25%.  . Tubal ligation  1981  . Left heart catheterization with coronary angiogram N/A 07/21/2012    Procedure: LEFT HEART CATHETERIZATION WITH CORONARY ANGIOGRAM;  Surgeon: Dolores Patty, MD;  Location[facility] CATH LAB;  Service: Cardiovascular;  Laterality: N/A;  . Percutaneous coronary stent intervention (pci-s) N/A 07/23/2012    Procedure: PERCUTANEOUS CORONARY STENT INTERVENTION (PCI-S);  Surgeon: Tonny Bollman, MD;  Location: [facility] CATH LAB;  Service: Cardiovascular;  Laterality: N/A;  . Bi-ventricular implantable cardioverter defibrillator N/A 07/06/2014    Procedure: BI-VENTRICULAR IMPLANTABLE CARDIOVERTER DEFIBRILLATOR  (CRT-D);  Surgeon: Duke Salvia, MD;  Location: [facility] CATH LAB;  Service: Cardiovascular;   Laterality: N/A;  . Colonoscopy with propofol Left 02/22/2015    Procedure: COLONOSCOPY WITH PROPOFOL;  Surgeon: Wallace Cullens, MD;  Location: [facility] ENDOSCOPY;  Service: Endoscopy;  Laterality: Left;  . Esophagogastroduodenoscopy N/A 02/22/2015    Procedure: ESOPHAGOGASTRODUODENOSCOPY (EGD);  Surgeon: Wallace Cullens, MD;  Location: [facility] ENDOSCOPY;  Service: Endoscopy;  Laterality: N/A;  . Peripheral vascular catheterization N/A 02/10/2015    Procedure: Picc Line Insertion;  Surgeon: Renford Dills, MD;  Location: [facility] INVASIVE CV LAB;  Service: Cardiovascular;  Laterality: N/A;  . Amputation toe Right 06/18/2015    Procedure: AMPUTATION TOE;  Surgeon: Gwyneth Revels, DPM;  Location: [facility] ORS;  Service: Podiatry;  Laterality: Right;  . Vaginal hysterectomy  01/2011    Fibroids/DUB.  Ovaries removed. Fontaine.   No Known Allergies  Social History   Social History  . Marital Status: Married    Spouse Name: N/A  . Number of Children: 2  . Years of Education: 12   Occupational History  . disabled     03/2010 for peripheral neuropathy  . home daycare     x 20 yrs.   Social History Main Topics  . Smoking status: Never Smoker   . Smokeless tobacco: Never Used  . Alcohol Use: No  . Drug Use: No  . Sexual Activity: No   Other Topics Concern  . Not on file   Social History Narrative   Marital status:Married x 42 yrs. Happily married, no abuse.       Children:  2 children (daughter 33, son 37). One grandson and 2 step grandchildren. Daughter and grandson live in home with pt and spouse.       Lives: with husband.      Employment: disability for peripheral neuropathy 2012.  Previously had home daycare.      Tobacco: none      Alcohol: none      Drugs: none      Exercise: none.  [employer].      Pets: dog.      Always uses seat belts, smoke detectors in home.      No guns in the home.       Caffeine use: 2 cups coffee per day.       Nutrition: Well balanced diet.   Family History    Problem Relation Age of Onset  . Diabetes Mother   . Hypertension Mother   . Arthritis Mother     knees, lumbar DDD, cervical DDD  . Cancer Father     prostate,skin,lymphoma.  . Obesity Brother   . Diabetes Maternal Grandmother   . Diabetes Paternal Grandmother        Objective:    BP 161/76 mmHg  Pulse 78  Temp(Src) 98.3 F (36.8 C)  Resp 16  Ht 5\' 8"  (1.727 m)  Wt 260 lb (117.935 kg)  BMI 39.54 kg/m2 Physical Exam  Constitutional: She is oriented to person, place, and time. She appears well-developed and well-nourished. No distress.  HENT:  Head: Normocephalic and atraumatic.  Right Ear: External ear normal.  Left Ear: External ear normal.  Nose: Nose normal.  Mouth/Throat: Oropharynx is clear and moist.  Eyes: Conjunctivae and EOM are normal. Pupils are equal, round, and reactive to light.  Neck: Normal range of motion. Neck supple. Carotid bruit is not present. No thyromegaly present.  Cardiovascular: Normal rate, regular rhythm, normal heart sounds and intact distal pulses.  Exam reveals no gallop and no friction rub.   No murmur heard. Pulmonary/Chest: Effort normal and breath sounds normal. She has no wheezes. She has no rales.  Abdominal: Soft. Bowel sounds are normal. She exhibits no distension and no mass. There is no tenderness. There is no rebound and no guarding.  Lymphadenopathy:    She has no cervical adenopathy.  Neurological: She is alert and oriented to person, place, and time. No cranial nerve deficit.  Skin: Skin is warm and dry. No rash noted. She is not diaphoretic. No erythema. No pallor.  Psychiatric: She has a normal mood and affect. Her behavior is normal.        Assessment & Plan:   1. Diabetic peripheral neuropathy associated with type 2 diabetes mellitus (HCC)   2. Pure hypercholesterolemia   3. Renal insufficiency   4. Iron deficiency anemia   5. 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 CBC with Differential/Platelet  . Comprehensive metabolic panel  . Microalbumin, urine  . POCT urinalysis dipstick   Meds ordered this encounter  Medications  . clopidogrel (PLAVIX) 75 MG tablet    Sig: Take 1 tablet (75 mg total) by mouth daily.    Dispense:  90 tablet    Refill:  3  . insulin aspart (NOVOLOG) 100 UNIT/ML FlexPen    Sig: Inject 5 Units into the skin 3 (three) times daily with meals.    Dispense:  15 mL    Refill:  11  . Insulin Glargine (LANTUS) 100 UNIT/ML Solostar Pen    Sig: Inject 15 Units into the skin at bedtime.    Dispense:  15 mL    Refill:  11    Return in about 7 months (around 07/13/2016) for complete physical examiniation; recheck in three months diabetes.    Hassen Bruun Paulita Fujita, M.D. [facility] [address] [place], [place]  [address] [phone] phone [phone] fax

## 2015-12-13 ENCOUNTER — Telehealth: Payer: Self-pay

## 2015-12-13 LAB — COMPREHENSIVE METABOLIC PANEL
ALT: 19 U/L (ref 6–29)
AST: 22 U/L (ref 10–35)
Albumin: 4 g/dL (ref 3.6–5.1)
Alkaline Phosphatase: 87 U/L (ref 33–130)
BILIRUBIN TOTAL: 0.4 mg/dL (ref 0.2–1.2)
BUN: 46 mg/dL — ABNORMAL HIGH (ref 7–25)
CALCIUM: 9.5 mg/dL (ref 8.6–10.4)
CO2: 32 mmol/L — AB (ref 20–31)
Chloride: 99 mmol/L (ref 98–110)
Creat: 1.41 mg/dL — ABNORMAL HIGH (ref 0.50–0.99)
GLUCOSE: 95 mg/dL (ref 65–99)
Potassium: 5.3 mmol/L (ref 3.5–5.3)
SODIUM: 139 mmol/L (ref 135–146)
Total Protein: 7.1 g/dL (ref 6.1–8.1)

## 2015-12-13 LAB — MICROALBUMIN, URINE: MICROALB UR: 10 mg/dL

## 2015-12-13 MED ORDER — GLUCOSE BLOOD VI STRP: ORAL_STRIP | Status: DC

## 2015-12-13 NOTE — Telephone Encounter (Signed)
Rx sent 

## 2015-12-13 NOTE — Telephone Encounter (Signed)
Pt states her insurance will cover her scripts 100% so would like the Dr to call some in for her, she is using the [employer] [facility] Please call [place] AT [phone]

## 2015-12-13 NOTE — Telephone Encounter (Signed)
Pt notified. Pharmacy notified to check tid.

## 2015-12-15 ENCOUNTER — Ambulatory Visit
Admission: RE | Admit: 2015-12-15 | Discharge: 2015-12-15 | Disposition: A | Discharge status: Home / Self Care | Payer: PPO | Source: Ambulatory Visit | Attending: Neurology | Admitting: Neurology

## 2015-12-15 DIAGNOSIS — G8220 Paraplegia, unspecified: Secondary | ICD-10-CM

## 2015-12-15 DIAGNOSIS — G8332 Monoplegia, unspecified affecting left dominant side: Secondary | ICD-10-CM

## 2015-12-15 DIAGNOSIS — I639 Cerebral infarction, unspecified: Secondary | ICD-10-CM

## 2015-12-19 ENCOUNTER — Other Ambulatory Visit: Payer: Self-pay | Admitting: Family Medicine

## 2015-12-20 DIAGNOSIS — M21372 Foot drop, left foot: Secondary | ICD-10-CM | POA: Diagnosis not present

## 2015-12-21 ENCOUNTER — Other Ambulatory Visit: Payer: Self-pay | Admitting: Family Medicine

## 2015-12-21 DIAGNOSIS — R221 Localized swelling, mass and lump, neck: Secondary | ICD-10-CM

## 2015-12-21 DIAGNOSIS — E041 Nontoxic single thyroid nodule: Secondary | ICD-10-CM

## 2015-12-26 ENCOUNTER — Other Ambulatory Visit: Payer: Self-pay | Admitting: *Deleted

## 2015-12-26 ENCOUNTER — Encounter: Payer: Self-pay | Admitting: *Deleted

## 2015-12-26 NOTE — Patient Outreach (Signed)
[facility]) Care Management  12/26/2015  [name] [birth year] [id number]   Subjective: Telephone call to patient's home number, spoke with patient, and HIPAA verified.   Patient gave verbal authorization for RNCM to speak with husband [name] Designer, industrial/product) and daughter [name] Designer, industrial/product) regarding her healthcare needs as needed.   Discussed [facility] Care Management services and patient in agreement to complete telephone screen.  Patient states she is doing very well.  States she does not have any care coordination, disease education, disease monitoring, community resource, pharmacy, or transportation needs at this time.  Patient declined [place] Management services and is in agreement to receive [place] Management program information.    Objective: Per Epic case review: Patient hospitalized  11/11/15 -11/12/15 for CVA and TIA.    Patient's primary MD is [name].    Assessment:  Received [place] referral on 12/20/15.    Referral source: Jettie Booze.    Referral reason: Disease and symptom management.     Plan:  RNCM will send case closure letter due to refusal to patient's primary MD. [facility] will send successful outreach letter, [facility] magnet, and pamphlet to patient, per patient's request. RNCM will send case closure request due to refusal / no care management needs to [place] at [place] [facility].    Mataio Mele H. Annia Friendly, BSN, [place] [facility] Telephonic CM Phone: [phone] Fax: [phone]

## 2016-01-01 ENCOUNTER — Telehealth: Payer: Self-pay | Admitting: Family Medicine

## 2016-01-01 NOTE — Telephone Encounter (Signed)
Pt called to check the status of scheduling  CT Soft Tissue Neck Wo Contrast (Order [id number])  Imaging  Order: [id number]   Date: 12/21/2015  Department: [facility]  Ordering/Authorizing: Wardell Honour, MD     Please call to consult [phone]

## 2016-01-02 ENCOUNTER — Ambulatory Visit (INDEPENDENT_AMBULATORY_CARE_PROVIDER_SITE_OTHER): Payer: PPO | Admitting: Family Medicine

## 2016-01-02 ENCOUNTER — Encounter: Payer: Self-pay | Admitting: Family Medicine

## 2016-01-02 VITALS — BP 134/74 | HR 69 | Temp 98.0°F | Resp 16 | Ht 69.0 in | Wt 258.8 lb

## 2016-01-02 DIAGNOSIS — F418 Other specified anxiety disorders: Secondary | ICD-10-CM

## 2016-01-02 DIAGNOSIS — E1142 Type 2 diabetes mellitus with diabetic polyneuropathy: Secondary | ICD-10-CM | POA: Diagnosis not present

## 2016-01-02 DIAGNOSIS — F32A Depression, unspecified: Secondary | ICD-10-CM

## 2016-01-02 DIAGNOSIS — N182 Chronic kidney disease, stage 2 (mild): Secondary | ICD-10-CM

## 2016-01-02 DIAGNOSIS — S91101A Unspecified open wound of right great toe without damage to nail, initial encounter: Secondary | ICD-10-CM

## 2016-01-02 DIAGNOSIS — F419 Anxiety disorder, unspecified: Secondary | ICD-10-CM

## 2016-01-02 DIAGNOSIS — F329 Major depressive disorder, single episode, unspecified: Secondary | ICD-10-CM

## 2016-01-02 MED ORDER — CITALOPRAM HYDROBROMIDE 10 MG PO TABS: 10.0000 mg | ORAL_TABLET | Freq: Every day | ORAL | Status: DC

## 2016-01-02 MED ORDER — DOXYCYCLINE HYCLATE 100 MG PO CAPS: 100.0000 mg | ORAL_CAPSULE | Freq: Two times a day (BID) | ORAL | Status: DC

## 2016-01-02 NOTE — Progress Notes (Signed)
Subjective:    Patient ID: [name], female    DOB: Feb 08, 1956, 60 y.o.   MRN: [id number]  01/02/2016  infection   HPI This 60 y.o. female presents for evaluation of R first toe redness.  Small little sore present; has been watching it; was drying up; removed scab with small hole under scab.  Bandaid fell off two days ago; mashed on wound and drainage expressed.  No odor.  Clear fluid.  Really scared pt.  S/p amputation.  Lost second toe on R in past year.     DMII: sugars before eating; sugar up 224 at night.  Eating a snack at bedtime. Must really watch what eats. Fastings:  95-150; [place] in am; still has a lot left. Lunch: 150-200 before lunch; Novolog 5units; sometimes skips lunch. Supper: 156-200-224; Novolog 5 units. Lantus 15units q am.  Major stressors at home for the past year; has stayed stressed about lots of issues at home.     Review of Systems  Constitutional: Negative for fever, chills, diaphoresis and fatigue.  Eyes: Negative for visual disturbance.  Respiratory: Negative for cough and shortness of breath.   Cardiovascular: Negative for chest pain, palpitations and leg swelling.  Gastrointestinal: Negative for nausea, vomiting, abdominal pain, diarrhea and constipation.  Endocrine: Negative for cold intolerance, heat intolerance, polydipsia, polyphagia and polyuria.  Neurological: Negative for dizziness, tremors, seizures, syncope, facial asymmetry, speech difficulty, weakness, light-headedness, numbness and headaches.    Past Medical History  Diagnosis Date  . Obesity, unspecified   . Unspecified vitamin D deficiency   . Pure hypercholesterolemia   . Neuropathy ([place]) 2011  . Chicken pox   . Chronic systolic CHF (congestive heart failure) (HCC)     a. mixed ICM & NICM; b. EF 20-25% by echo 07/2012, mid-dist 2/3 of LV sev HK/AK, mild MR. echo 10/2012: EF 30-35%, sev HK ant-septal & inf walls, GR1DD, mild MR, PASP 33. c. echo 02/2013: EF 30%, GR1DD,  mild MR. echo 04/2014: EF 30%, Septal-lat dyssynchrony, global HK, inf AK, GR1DD, mild MR. d. echo 10/2014: EF50-55%, WM nl, GR1DD, septal mild paradox. e. echo 02/2015: EF 50-55%, wm   . Hypertension   . Anemia   . Automatic implantable cardioverter-defibrillator in situ     a. MDT CRT-D 06/2014, [id number]    . Type II diabetes mellitus ([place])   . CAD (coronary artery disease)     a. cardiac cath 101/04/2012: PCI/DES to chronically occluded mLAD, consideration PCI to diag branch in 4 weeks.   Marland Kitchen History of blood transfusion ~ 2011    "plasma; had neuropathy; couldn't walk"  . OSA on CPAP     Moderate with AHI 23/hr and now on CPAP at 16cm H2O.  Her DME is [facility]  . LBBB (left bundle branch block)   . Heart attack [facility])    Past Surgical History  Procedure Laterality Date  . Lumbar disc surgery  1996    L4-5  . Incision and drainage abscess Right 2007    groin; with ICU stay due to sepsis.  . Bilateral oophorectomy  01/2011    ovarian cyst benign  . Bi-ventricular implantable cardioverter defibrillator  (crt-d)  07/06/2014  . Laparoscopic cholecystectomy  2011  . Back surgery    . Coronary angioplasty with stent placement Left 07/2012    new onset systolic CHF; elevated troponins.  Cardiac catheterization with stenting to LAD; EF 15%.  2D-echo: EF 20-25%.  . Tubal ligation  1981  . Left  heart catheterization with coronary angiogram N/A 07/21/2012    Procedure: LEFT HEART CATHETERIZATION WITH CORONARY ANGIOGRAM;  Surgeon: Jolaine Artist, MD;  Location: [name] CATH LAB;  Service: Cardiovascular;  Laterality: N/A;  . Percutaneous coronary stent intervention (pci-s) N/A 07/23/2012    Procedure: PERCUTANEOUS CORONARY STENT INTERVENTION (PCI-S);  Surgeon: Sherren Mocha, MD;  Location: [facility] CATH LAB;  Service: Cardiovascular;  Laterality: N/A;  . Bi-ventricular implantable cardioverter defibrillator N/A 07/06/2014    Procedure: BI-VENTRICULAR IMPLANTABLE CARDIOVERTER DEFIBRILLATOR  (CRT-D);   Surgeon: Deboraha Sprang, MD;  Location: [facility] CATH LAB;  Service: Cardiovascular;  Laterality: N/A;  . Colonoscopy with propofol Left 02/22/2015    Procedure: COLONOSCOPY WITH PROPOFOL;  Surgeon: Hulen Luster, MD;  Location: [facility] ENDOSCOPY;  Service: Endoscopy;  Laterality: Left;  . Esophagogastroduodenoscopy N/A 02/22/2015    Procedure: ESOPHAGOGASTRODUODENOSCOPY (EGD);  Surgeon: Hulen Luster, MD;  Location: [facility] ENDOSCOPY;  Service: Endoscopy;  Laterality: N/A;  . Peripheral vascular catheterization N/A 02/10/2015    Procedure: Picc Line Insertion;  Surgeon: Katha Cabal, MD;  Location: [facility] CV LAB;  Service: Cardiovascular;  Laterality: N/A;  . Amputation toe Right 06/18/2015    Procedure: AMPUTATION TOE;  Surgeon: Samara Deist, DPM;  Location: [facility] ORS;  Service: Podiatry;  Laterality: Right;  . Vaginal hysterectomy  01/2011    Fibroids/DUB.  Ovaries removed. Fontaine.   No Known Allergies  Social History   Social History  . Marital Status: Married    Spouse Name: N/A  . Number of Children: 2  . Years of Education: 12   Occupational History  . disabled     03/2010 for peripheral neuropathy  . home daycare     x 20 yrs.   Social History Main Topics  . Smoking status: Never Smoker   . Smokeless tobacco: Never Used  . Alcohol Use: No  . Drug Use: No  . Sexual Activity: No   Other Topics Concern  . Not on file   Social History Narrative   Marital status:Married x 42 yrs. Happily married, no abuse.       Children:  2 children (daughter 82, son 51). One grandson and 2 step grandchildren. Daughter and grandson live in home with pt and spouse.       Lives: with husband.      Employment: disability for peripheral neuropathy 2012.  Previously had home daycare.      Tobacco: none      Alcohol: none      Drugs: none      Exercise: none.  [employer].      Pets: dog.      Always uses seat belts, smoke detectors in home.      No guns in the home.       Caffeine use: 2  cups coffee per day.       Nutrition: Well balanced diet.   Family History  Problem Relation Age of Onset  . Diabetes Mother   . Hypertension Mother   . Arthritis Mother     knees, lumbar DDD, cervical DDD  . Cancer Father     prostate,skin,lymphoma.  . Obesity Brother   . Diabetes Maternal Grandmother   . Diabetes Paternal Grandmother        Objective:    BP 134/74 mmHg  Pulse 69  Temp(Src) 98 F (36.7 C) (Oral)  Resp 16  Ht 5\' 9"  (1.753 m)  Wt 258 lb 12.8 oz (117.391 kg)  BMI 38.20 kg/m2 Physical  Exam  Constitutional: She is oriented to person, place, and time. She appears well-developed and well-nourished. No distress.  HENT:  Head: Normocephalic and atraumatic.  Right Ear: External ear normal.  Left Ear: External ear normal.  Nose: Nose normal.  Mouth/Throat: Oropharynx is clear and moist.  Eyes: Conjunctivae and EOM are normal. Pupils are equal, round, and reactive to light.  Neck: Normal range of motion. Neck supple. Carotid bruit is not present. No thyromegaly present.  Cardiovascular: Normal rate, regular rhythm, normal heart sounds and intact distal pulses.  Exam reveals no gallop and no friction rub.   No murmur heard. Pulmonary/Chest: Effort normal and breath sounds normal. She has no wheezes. She has no rales.  Abdominal: Soft. Bowel sounds are normal. She exhibits no distension and no mass. There is no tenderness. There is no rebound and no guarding.  Musculoskeletal:       Feet:  Pustular area at R first toe with surrounding erythema by 1 cm; no fluctuance.  No streaking.  Lymphadenopathy:    She has no cervical adenopathy.  Neurological: She is alert and oriented to person, place, and time. No cranial nerve deficit.  Skin: Skin is warm and dry. No rash noted. She is not diaphoretic. No erythema. No pallor.  Psychiatric: She has a normal mood and affect. Her behavior is normal.        Assessment & Plan:   1. Open wound of right great toe, initial  encounter   2. Diabetic peripheral neuropathy associated with type 2 diabetes mellitus ([place])   3. Chronic renal insufficiency, stage 2 (mild)   4. Anxiety and depression     Orders Placed This Encounter  Procedures  . Wound culture    Order Specific Question:  Source    Answer:  R first toe wound   Meds ordered this encounter  Medications  . OVER THE COUNTER MEDICATION    Sig:   . doxycycline (VIBRAMYCIN) 100 MG capsule    Sig: Take 1 capsule (100 mg total) by mouth 2 (two) times daily.    Dispense:  30 capsule    Refill:  0  . citalopram (CELEXA) 10 MG tablet    Sig: Take 1 tablet (10 mg total) by mouth daily.    Dispense:  90 tablet    Refill:  1    No Follow-up on file.    Kristi Elayne Guerin, M.D. [facility] [address] [place], [place]  [address] [phone] phone [phone] fax

## 2016-01-02 NOTE — Patient Instructions (Addendum)
      1. Increase Novolog to 8 units before each meal.   2.  Continue Lantus 15 units daily.        IF you received an x-ray today, you will receive an invoice from [facility] Radiology. Please contact [facility] at [phone] with questions or concerns regarding your invoice.   IF you received labwork today, you will receive an invoice from [employer]. Please contact [facility] at [phone] with questions or concerns regarding your invoice.   Our billing staff will not be able to assist you with questions regarding bills from these companies.  You will be contacted with the lab results as soon as they are available. The fastest way to get your results is to activate your My Chart account. Instructions are located on the last page of this paperwork. If you have not heard from [place] regarding the results in 2 weeks, please contact this office.

## 2016-01-02 NOTE — Telephone Encounter (Signed)
The patient is scheduled for the CT scan on 01/05/16 at [facility] on [address] at 4:00pm, arrival time 3:45pm.  There are no restrictions for food or drink.  Patient notified - left voicemail and spoke to the patient.

## 2016-01-04 DIAGNOSIS — E11621 Type 2 diabetes mellitus with foot ulcer: Secondary | ICD-10-CM | POA: Diagnosis not present

## 2016-01-04 DIAGNOSIS — Z794 Long term (current) use of insulin: Secondary | ICD-10-CM | POA: Diagnosis not present

## 2016-01-04 DIAGNOSIS — L97509 Non-pressure chronic ulcer of other part of unspecified foot with unspecified severity: Secondary | ICD-10-CM | POA: Diagnosis not present

## 2016-01-04 DIAGNOSIS — L97511 Non-pressure chronic ulcer of other part of right foot limited to breakdown of skin: Secondary | ICD-10-CM | POA: Diagnosis not present

## 2016-01-04 DIAGNOSIS — Z89421 Acquired absence of other right toe(s): Secondary | ICD-10-CM | POA: Diagnosis not present

## 2016-01-05 ENCOUNTER — Ambulatory Visit
Admission: RE | Admit: 2016-01-05 | Discharge: 2016-01-05 | Disposition: A | Discharge status: Home / Self Care | Payer: PPO | Source: Ambulatory Visit | Attending: Family Medicine | Admitting: Family Medicine

## 2016-01-05 DIAGNOSIS — E041 Nontoxic single thyroid nodule: Secondary | ICD-10-CM | POA: Diagnosis not present

## 2016-01-05 DIAGNOSIS — R221 Localized swelling, mass and lump, neck: Secondary | ICD-10-CM | POA: Diagnosis not present

## 2016-01-05 LAB — WOUND CULTURE
GRAM STAIN: NONE SEEN
Gram Stain: NONE SEEN

## 2016-01-07 ENCOUNTER — Encounter: Payer: Self-pay | Admitting: Family Medicine

## 2016-01-23 ENCOUNTER — Ambulatory Visit (INDEPENDENT_AMBULATORY_CARE_PROVIDER_SITE_OTHER): Payer: PPO | Admitting: *Deleted

## 2016-01-23 DIAGNOSIS — Z95810 Presence of automatic (implantable) cardiac defibrillator: Secondary | ICD-10-CM | POA: Diagnosis not present

## 2016-01-23 DIAGNOSIS — I5022 Chronic systolic (congestive) heart failure: Secondary | ICD-10-CM | POA: Diagnosis not present

## 2016-01-23 DIAGNOSIS — I255 Ischemic cardiomyopathy: Secondary | ICD-10-CM | POA: Diagnosis not present

## 2016-01-23 NOTE — Progress Notes (Signed)
Remote ICD transmission.   

## 2016-01-25 DIAGNOSIS — L97511 Non-pressure chronic ulcer of other part of right foot limited to breakdown of skin: Secondary | ICD-10-CM | POA: Diagnosis not present

## 2016-02-02 DIAGNOSIS — I129 Hypertensive chronic kidney disease with stage 1 through stage 4 chronic kidney disease, or unspecified chronic kidney disease: Secondary | ICD-10-CM | POA: Diagnosis not present

## 2016-02-02 DIAGNOSIS — D631 Anemia in chronic kidney disease: Secondary | ICD-10-CM | POA: Diagnosis not present

## 2016-02-02 DIAGNOSIS — R809 Proteinuria, unspecified: Secondary | ICD-10-CM | POA: Diagnosis not present

## 2016-02-02 DIAGNOSIS — N183 Chronic kidney disease, stage 3 (moderate): Secondary | ICD-10-CM | POA: Diagnosis not present

## 2016-02-02 DIAGNOSIS — E1122 Type 2 diabetes mellitus with diabetic chronic kidney disease: Secondary | ICD-10-CM | POA: Diagnosis not present

## 2016-02-11 ENCOUNTER — Encounter: Payer: Self-pay | Admitting: Internal Medicine

## 2016-02-19 ENCOUNTER — Ambulatory Visit (INDEPENDENT_AMBULATORY_CARE_PROVIDER_SITE_OTHER): Payer: PPO

## 2016-02-19 ENCOUNTER — Telehealth: Payer: Self-pay | Admitting: Physician Assistant

## 2016-02-19 ENCOUNTER — Ambulatory Visit (INDEPENDENT_AMBULATORY_CARE_PROVIDER_SITE_OTHER): Payer: PPO | Admitting: Physician Assistant

## 2016-02-19 VITALS — BP 158/70 | HR 70 | Temp 97.6°F | Resp 16 | Ht 69.0 in | Wt 273.0 lb

## 2016-02-19 DIAGNOSIS — M25572 Pain in left ankle and joints of left foot: Secondary | ICD-10-CM

## 2016-02-19 DIAGNOSIS — E041 Nontoxic single thyroid nodule: Secondary | ICD-10-CM | POA: Diagnosis not present

## 2016-02-19 DIAGNOSIS — M7989 Other specified soft tissue disorders: Secondary | ICD-10-CM | POA: Diagnosis not present

## 2016-02-19 DIAGNOSIS — L97409 Non-pressure chronic ulcer of unspecified heel and midfoot with unspecified severity: Secondary | ICD-10-CM | POA: Insufficient documentation

## 2016-02-19 NOTE — Telephone Encounter (Signed)
Spoke with Anderson Malta at [facility]. They do not perform US guided biopsies there, and do not do the scheduling for them.  She was kind enough to transfer me to the department that does the scheduling. Left a message there for [name].

## 2016-02-19 NOTE — Telephone Encounter (Signed)
Apparently there was a faxed form to complete and return the the scheduling team, and they did not receive it back from [place]. [name] will resend it and I will complete and return it so that the biopsy can be scheduled.

## 2016-02-19 NOTE — Patient Instructions (Addendum)
Please call [place] [name]) [phone] if you haven't heard from them by tomorrow afternoon. Dr. Tamala Julian has referred you twice to have the thyroid biopsy. I will complete the form and fax it back to them today. If you are unsuccessful, please contact our office and ask to speak with the referrals staff.    IF you received an x-ray today, you will receive an invoice from [facility]. Please contact [facility] at [phone] with questions or concerns regarding your invoice.   IF you received labwork today, you will receive an invoice from [employer]. Please contact [facility] at [phone] with questions or concerns regarding your invoice.   Our billing staff will not be able to assist you with questions regarding bills from these companies.  You will be contacted with the lab results as soon as they are available. The fastest way to get your results is to activate your My Chart account. Instructions are located on the last page of this paperwork. If you have not heard from [place] regarding the results in 2 weeks, please contact this office.

## 2016-02-19 NOTE — Progress Notes (Signed)
Patient ID: [name], female    DOB: 01/19/1956, 60 y.o.   MRN: [id number]  PCP: [name], MD  Subjective:   Chief Complaint  Patient presents with  . Foot Pain    left foot/ x 4 wks    HPI Presents for evaluation of LEFT foot pain.  [place] Put her knee in the seat of a rocking chair to lean on while opening the window. Knee slipped out of the chair, and "sat down" on foot and leg underneath her. Heard something pop. The next day she noticed bruising, a blue color of the foot between the second and third toes. Ankle has also been  bothering her intermittently, and becoming more frequent. Getting increased swelling of the entire lower leg. Some improvement in swelling over night.  Has peripheral neuropathy, and does not have sensation to touch on the feet. Typically wears a brace for the LEFT foot due to inability to dorsiflex the ankle, but hasn't been able to wear it recently due to discomfort since her injury.  Of note, she has a thyroid nodule. Orders have been placed for biopsy x 2, but the patient has not been contacted.   Review of Systems As above.    Patient Active Problem List   Diagnosis Date Noted  . TIA (transient ischemic attack) 11/11/2015  . History of CVA (cerebrovascular accident) 11/11/2015  . Chronic renal insufficiency 08/04/2015  . Diabetes mellitus, type 2 ([place]) 04/27/2015  . GIB (gastrointestinal bleeding) 02/17/2015  . OSA (obstructive sleep apnea) 08/15/2014  . Chronic left ventricular systolic dysfunction 0000000  . Morbid obesity ([place]) 10/26/2013  . Essential hypertension, benign 04/29/2013  . Pure hypercholesterolemia 12/07/2012  . Iron deficiency anemia 12/07/2012  . Diabetic peripheral neuropathy associated with type 2 diabetes mellitus ([place]) 12/07/2012  . Chronic systolic heart failure ([place]) 07/29/2012  . Left bundle branch block 07/25/2012  . CAD in native artery 07/25/2012     Prior to  Admission medications   Medication Sig Start Date End Date Taking? Authorizing Provider  aspirin EC 81 MG tablet Take 1 tablet (81 mg total) by mouth daily. 03/31/14  Yes Wardell Honour, MD  Calcium Carb-Cholecalciferol (CALCIUM 600 + D PO) Take 1 tablet by mouth 2 (two) times daily.   Yes Historical Provider, MD  carvedilol (COREG) 3.125 MG tablet Take 1 tablet (3.125 mg total) by mouth 2 (two) times daily. 07/27/15  Yes Wellington Hampshire, MD  citalopram (CELEXA) 10 MG tablet Take 1 tablet (10 mg total) by mouth daily. 01/02/16  Yes Wardell Honour, MD  clopidogrel (PLAVIX) 75 MG tablet Take 1 tablet (75 mg total) by mouth daily. 12/12/15  Yes Wardell Honour, MD  gabapentin (NEURONTIN) 600 MG tablet TAKE 1 TABLET BY MOUTH 3 TIMES DAILY 12/20/15  Yes Wardell Honour, MD  glimepiride (AMARYL) 4 MG tablet Take 1 tablet (4 mg total) by mouth 2 (two) times daily with a meal. 08/22/15  Yes Wardell Honour, MD  glucose blood test strip Use as instructed DX E11.42 12/13/15  Yes Wardell Honour, MD  insulin aspart (NOVOLOG) 100 UNIT/ML FlexPen Inject 5 Units into the skin 3 (three) times daily with meals. 12/12/15  Yes Wardell Honour, MD  Insulin Glargine (LANTUS) 100 UNIT/ML Solostar Pen Inject 15 Units into the skin at bedtime. 12/12/15  Yes Wardell Honour, MD  isosorbide mononitrate (IMDUR) 30 MG 24 hr tablet Take 1 tablet (30 mg total) by  mouth daily. 07/27/15  Yes Wellington Hampshire, MD  MULTIPLE VITAMIN PO Take 1 tablet by mouth every morning.    Yes Historical Provider, MD  Omega-3 Fatty Acids (FISH OIL) 1000 MG CAPS Take 1,000 mg by mouth 2 (two) times daily.    Yes Historical Provider, MD  OVER THE COUNTER MEDICATION    Yes Historical Provider, MD  pravastatin (PRAVACHOL) 40 MG tablet Take 1 tablet (40 mg total) by mouth at bedtime. 11/17/15  Yes Wardell Honour, MD  torsemide (DEMADEX) 20 MG tablet Take 1 tablet (20 mg total) by mouth daily. 09/22/15  Yes Wellington Hampshire, MD  traMADol (ULTRAM) 50 MG tablet TAKE 1  TABLET EVERY 6 HOURS AS NEEDED FOR PAIN 07/31/15  Yes Wardell Honour, MD  AMBULATORY NON FORMULARY MEDICATION 1 Units by Other route once. Medication Name: foot brace Patient not taking: Reported on 02/19/2016 11/15/15   Alda Berthold, DO     No Known Allergies     Objective:  Physical Exam  Constitutional: She is oriented to person, place, and time. She appears well-developed and well-nourished. She is active and cooperative. No distress.  BP 158/70 mmHg  Pulse 70  Temp(Src) 97.6 F (36.4 C) (Oral)  Resp 16  Ht 5\' 9"  (1.753 m)  Wt 273 lb (123.832 kg)  BMI 40.30 kg/m2  SpO2 96%   Eyes: Conjunctivae are normal.  Pulmonary/Chest: Effort normal.  Musculoskeletal:       Left knee: Normal.       Left ankle: Normal. Achilles tendon normal.       Left lower leg: She exhibits edema (non-pitting to the knee).       Legs:      Feet:  Neurological: She is alert and oriented to person, place, and time.  Psychiatric: She has a normal mood and affect. Her speech is normal and behavior is normal.    Dg Ankle Complete Left  02/19/2016  CLINICAL DATA:  Pain.  Injury. EXAM: LEFT ANKLE COMPLETE - 3+ VIEW COMPARISON:  01/10/2015.  05/17/2010. FINDINGS: Diffuse soft tissue swelling is present. Subtle nondisplaced fracture of the medial malleolus cannot be entirely excluded. Diffuse severe degenerative change present. Peripheral vascular calcification. IMPRESSION: 1. Diffuse soft tissue swelling. Subtle nondisplaced fracture of the medial malleolus cannot be entirely excluded. 2. Diffuse severe degenerative change. 3. Peripheral vascular disease. Electronically Signed   By: Marcello Moores  Register   On: 02/19/2016 13:06   Dg Foot 2 Views Left  02/19/2016  CLINICAL DATA:  Left ankle/ foot pain EXAM: LEFT FOOT - 2 VIEW COMPARISON:  01/10/2015 FINDINGS: No fracture or dislocation is seen. Degenerative changes of the 2nd PIP joint. Mild degenerative changes of the dorsal midfoot. Mild soft tissue swelling  overlying the dorsal forefoot. Small plantar calcaneal enthesophyte. IMPRESSION: No fracture or dislocation is seen. Mild degenerative changes. Mild soft tissue swelling. Electronically Signed   By: Julian Hy M.D.   On: 02/19/2016 13:04          Assessment & Plan:   1. Pain in joint, ankle and foot, left Probable medial malleolus fracture. CAM walker. Elevate. RTC 2 weeks. - DG Ankle Complete Left; Future - DG Foot 2 Views Left; Future  2. Thyroid nodule I've spoken with scheduling regarding the biopsy. They will re-fax the form required. I will complete it and return it to them.   Fara Chute, PA-C Physician Assistant-Certified [facility]

## 2016-02-22 DIAGNOSIS — Z794 Long term (current) use of insulin: Secondary | ICD-10-CM | POA: Diagnosis not present

## 2016-02-22 DIAGNOSIS — E11621 Type 2 diabetes mellitus with foot ulcer: Secondary | ICD-10-CM | POA: Diagnosis not present

## 2016-02-22 DIAGNOSIS — L97511 Non-pressure chronic ulcer of other part of right foot limited to breakdown of skin: Secondary | ICD-10-CM | POA: Diagnosis not present

## 2016-02-22 DIAGNOSIS — B351 Tinea unguium: Secondary | ICD-10-CM | POA: Diagnosis not present

## 2016-02-22 DIAGNOSIS — L97509 Non-pressure chronic ulcer of other part of unspecified foot with unspecified severity: Secondary | ICD-10-CM | POA: Diagnosis not present

## 2016-02-26 ENCOUNTER — Ambulatory Visit: Payer: PPO

## 2016-02-29 ENCOUNTER — Encounter: Payer: Self-pay | Admitting: Cardiology

## 2016-02-29 LAB — CUP PACEART REMOTE DEVICE CHECK
HIGH POWER IMPEDANCE MEASURED VALUE: 74 Ohm
Implantable Lead Implant Date: 20150930
Implantable Lead Location: 753857
Implantable Lead Location: 753860
Lead Channel Impedance Value: 399 Ohm
Lead Channel Pacing Threshold Amplitude: 0.5 V
Lead Channel Pacing Threshold Amplitude: 0.875 V
Lead Channel Pacing Threshold Pulse Width: 0.4 ms
Lead Channel Setting Pacing Amplitude: 2 V
Lead Channel Setting Sensing Sensitivity: 0.3 mV
MDC IDC LEAD IMPLANT DT: 20150930
MDC IDC LEAD IMPLANT DT: 20150930
MDC IDC LEAD LOCATION: 753859
MDC IDC LEAD MODEL: 4398
MDC IDC MSMT LEADCHNL LV IMPEDANCE VALUE: 456 Ohm
MDC IDC MSMT LEADCHNL RA SENSING INTR AMPL: 1.3 mV
MDC IDC MSMT LEADCHNL RV IMPEDANCE VALUE: 456 Ohm
MDC IDC MSMT LEADCHNL RV PACING THRESHOLD PULSEWIDTH: 0.4 ms
MDC IDC MSMT LEADCHNL RV SENSING INTR AMPL: 20 mV
MDC IDC SESS DTM: 20170525141353
MDC IDC SET LEADCHNL LV PACING AMPLITUDE: 2 V
MDC IDC SET LEADCHNL LV PACING PULSEWIDTH: 0.4 ms
MDC IDC SET LEADCHNL RA PACING AMPLITUDE: 1.5 V
MDC IDC SET LEADCHNL RV PACING PULSEWIDTH: 0.4 ms

## 2016-03-06 ENCOUNTER — Ambulatory Visit
Admission: RE | Admit: 2016-03-06 | Discharge: 2016-03-06 | Disposition: A | Discharge status: Home / Self Care | Payer: PPO | Source: Ambulatory Visit

## 2016-03-06 DIAGNOSIS — Z1231 Encounter for screening mammogram for malignant neoplasm of breast: Secondary | ICD-10-CM | POA: Diagnosis not present

## 2016-03-15 NOTE — Telephone Encounter (Signed)
Left message for [name], asking that she schedule the US guided biopsy, without the extra testing (reflex if equivocal results).

## 2016-03-19 ENCOUNTER — Other Ambulatory Visit: Payer: Self-pay | Admitting: Family Medicine

## 2016-03-19 NOTE — Telephone Encounter (Signed)
Please call in refill of Tramadol to pharmacy as approved.

## 2016-03-20 NOTE — Telephone Encounter (Signed)
Called in.

## 2016-04-15 ENCOUNTER — Encounter: Payer: Self-pay | Admitting: Neurology

## 2016-04-15 ENCOUNTER — Ambulatory Visit (INDEPENDENT_AMBULATORY_CARE_PROVIDER_SITE_OTHER): Payer: PPO | Admitting: Neurology

## 2016-04-15 VITALS — BP 118/64 | HR 59 | Ht 69.0 in | Wt 279.1 lb

## 2016-04-15 DIAGNOSIS — I639 Cerebral infarction, unspecified: Secondary | ICD-10-CM | POA: Diagnosis not present

## 2016-04-15 DIAGNOSIS — E0842 Diabetes mellitus due to underlying condition with diabetic polyneuropathy: Secondary | ICD-10-CM

## 2016-04-15 DIAGNOSIS — G8332 Monoplegia, unspecified affecting left dominant side: Secondary | ICD-10-CM

## 2016-04-15 NOTE — Patient Instructions (Signed)
We will call you about any medication changes. Return to clinic in 4 months

## 2016-04-15 NOTE — Progress Notes (Signed)
Follow-up Visit   Date: 04/15/2016    [name] MRN: [id number] DOB: 03-Feb-1956   Interim History: [name] is a 60 y.o. right-handed Caucasian female with diabetes mellitus type 2 since age of 89 complicated by neuropathy, left leg diabetic amyotrophy (2011), and CHF s/p ICD returning to the clinic for follow-up of left leg weakness.  The patient was accompanied to the clinic by husband who also provides collateral information.    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 At that time, she was under the care of Dr. Erling Cruz at [facility]. Hemoglobin A1c in 2011 was 15 and she had associated loss of 130 pounds in the preceding year. Slowly, she developed left knee buckling and leg weakness which progressed to where she had a fall and had to be hospitalized. She has had persistent numbness involving her left lower extremity. Imaging showed multilevel spondylosis as well as postsurgical L4-5 and L5-S1 left hemilaminectomy and discectomy. Electrodiagnostic showed profound denervation involving the vastus medialis and iliopsoas muscles in addition to sensory abnormalities, suggesting diabetic amyotrophy. She underwent treatment with IVIG and was ultimately discharged to rehabilitation facility. Over the next 8 months, she continued to do rehabilitation and was eventually able to walk independently again, but still using a walker as needed.  Patient was reportedly doing well until February 2016 when she slowly started developing painless left foot drop and bilateral proximal leg weakness. Prior to February, she was walking independently, but now is dependent on a walk, and unable to make transfers independently. She has functionally dependent on her family members to transfer out of chairs, off the commode, as well as into and out of a car. She is able to bathe and dress herself. He has not had  any falls but is very cautious when walking. She endorses difficulty with climbing stairs and walking. She has residual numbness of the left leg.  In mid-April, she developed weakness of the left last two fingers, where she is unable to extend the 4th and 5th digits on the left. She has difficulty gripping things with her left hand. There is no associated numbness/tingling of the hand.   UPDATE 06/15/2015:  She has underwent a battery of tests since her last visit including serum, CSF testing, and EMGs.  There was evidence of sensorimotor neuropathy as well as polyradiculoneuropathy.  CSF testing showed slightly elevated protein, otherwise was normal.  More notable was that her inflammatory markers, ANA, and p-ANCA returned elevated.  She saw rheumatology who ordered additional testing, but patient is unaware of these results and did not have a return visit.  She has been undergoing hyperbaric oxygen therapy for her wound which has helped left heel ulcer, but now she has right toe ulcer.  The strength of her legs has significantly improved and over the past month, she has been walking independently.  Her left hand remains contracted which limits her ability to use it.  UPDATE 10/15/2014:  She has continued to see improvement in her motor strength.  She has been driving since October and able to control the pedal without difficulty.  She had residual left foot drop from 2011, but overall feels back to herself.  She can still tire with prolonged activity. No interval falls and no new complaints. She is requesting handicap placard for parking.  UPDATE 11/24/2015:  Patient had in interval hospitalization for stroke manifesting with left leg>arm/face numbness and weakness (  NIHSS 2) on 2/4.  CT showed a small right centrum semiovale infarct.  Intracranial imaging was not performed and her echo was suboptimal.  US carotids showed <50% stenosis bilaterally.  Medical management was optimized by adding plavix '75mg'$  to  aspirin '81mg'$  which she was already taking.  Statin therapy was added by her PCP.  She no longer has numbness of the face and arm, but has mild residual numbness of the left leg and foot.  She is also dragging the left foot more than before.  She is doing PT and has an appointment today to be fit for AFO.  UPDATE 04/15/2016:  No new complaints today and has residual left leg weakness which has been stable.  She has been compliant about using her AFO and has not sustained any falls.  She is much more easier to bruise being on both aspirin and plavix.  No interval falls, neurological events, or hospitalizations.  Medications:  Current Outpatient Prescriptions on File Prior to Visit  Medication Sig Dispense Refill  . AMBULATORY NON FORMULARY MEDICATION 1 Units by Other route once. Medication Name: foot brace (Patient not taking: Reported on 02/19/2016) 1 Units 0  . aspirin EC 81 MG tablet Take 1 tablet (81 mg total) by mouth daily. 90 tablet 3  . Calcium Carb-Cholecalciferol (CALCIUM 600 + D PO) Take 1 tablet by mouth 2 (two) times daily.    . carvedilol (COREG) 3.125 MG tablet Take 1 tablet (3.125 mg total) by mouth 2 (two) times daily. 60 tablet 6  . citalopram (CELEXA) 10 MG tablet Take 1 tablet (10 mg total) by mouth daily. 90 tablet 1  . clopidogrel (PLAVIX) 75 MG tablet Take 1 tablet (75 mg total) by mouth daily. 90 tablet 3  . gabapentin (NEURONTIN) 600 MG tablet TAKE 1 TABLET BY MOUTH 3 TIMES DAILY 270 tablet 0  . glimepiride (AMARYL) 4 MG tablet Take 1 tablet (4 mg total) by mouth 2 (two) times daily with a meal. 30 tablet 3  . glucose blood test strip Use as instructed DX E11.42 100 each 4  . insulin aspart (NOVOLOG) 100 UNIT/ML FlexPen Inject 5 Units into the skin 3 (three) times daily with meals. 15 mL 11  . Insulin Glargine (LANTUS) 100 UNIT/ML Solostar Pen Inject 15 Units into the skin at bedtime. 15 mL 11  . isosorbide mononitrate (IMDUR) 30 MG 24 hr tablet Take 1 tablet (30 mg total) by  mouth daily. 30 tablet 6  . MULTIPLE VITAMIN PO Take 1 tablet by mouth every morning.     . Omega-3 Fatty Acids (FISH OIL) 1000 MG CAPS Take 1,000 mg by mouth 2 (two) times daily.     Marland Kitchen OVER THE COUNTER MEDICATION     . pravastatin (PRAVACHOL) 40 MG tablet Take 1 tablet (40 mg total) by mouth at bedtime. 90 tablet 3  . torsemide (DEMADEX) 20 MG tablet Take 1 tablet (20 mg total) by mouth daily. 30 tablet 11  . traMADol (ULTRAM) 50 MG tablet TAKE 1 TABLET EVERY 6 HOURS AS NEEDED FOR PAIN 120 tablet 5   No current facility-administered medications on file prior to visit.    Allergies: No Known Allergies  Review of Systems:  CONSTITUTIONAL: No fevers, chills, night sweats, or weight loss.  EYES: No visual changes or eye pain ENT: No hearing changes.  No history of nose bleeds.   RESPIRATORY: No cough, wheezing and shortness of breath.   CARDIOVASCULAR: Negative for chest pain, and palpitations.   GI:  Negative for abdominal discomfort, blood in stools or black stools.  No recent change in bowel habits.   GU:  No history of incontinence.   MUSCLOSKELETAL: +history of joint pain or swelling.  No myalgias.   SKIN: Negative for lesions, rash, and itching.   ENDOCRINE: Negative for cold or heat intolerance, polydipsia or goiter.   PSYCH:  No depression or anxiety symptoms.   NEURO: As Above.   Vital Signs:  BP 118/64 mmHg  Pulse 59  Ht '5\' 9"'$  (1.753 m)  Wt 279 lb 2 oz (126.61 kg)  BMI 41.20 kg/m2  SpO2 99%  Neurological Exam: MENTAL STATUS including orientation to time, place, person, recent and remote memory, attention span and concentration, language, and fund of knowledge is normal.  Speech is not dysarthric.  CRANIAL NERVES:  Pupils equal round and reactive to light.  Normal conjugate, extra-ocular eye movements in all directions of gaze.  No ptosis.  Face is symmetric. Palate elevates symmetrically.  Tongue is midline.    MOTOR:  Left TA atrophy (stable)  No fasciculations or  abnormal movements.  Tone is normal.    Right Upper Extremity:    Left Upper Extremity:    Deltoid  5/5   Deltoid  5/5   Biceps  5/5   Biceps  5/5   Triceps  5/5   Triceps  5/5   Wrist extensors  5/5   Wrist extensors  5/5   Wrist flexors  5/5   Wrist flexors  5/5   Finger extensors  5/5   Finger extensors * Limited by flexion contracture of the 3-5th digits at the MCP and PIP 4+/5   Finger flexors  5/5   Finger flexors  5/5   Dorsal interossei  5/5   Dorsal interossei  5/5   Abductor pollicis  5/5   Abductor pollicis  5/5   Tone (Ashworth scale)  0  Tone (Ashworth scale)  0   Right Lower Extremity:    Left Lower Extremity:    Hip flexors  5/5   Hip flexors  5/5   Hip extensors  5/5   Hip extensors  5/5   Knee flexors  5/5   Knee flexors  5/5   Knee extensors  5/5   Knee extensors  5/5   Dorsiflexors  5/5   Dorsiflexors  2/5   Plantarflexors  5/5   Plantarflexors  3/5   Toe extensors  5/5   Toe extensors  2/5   Toe flexors  5/5   Toe flexors  2/5   Tone (Ashworth scale)  0  Tone (Ashworth scale)  0    MSRs:  Right                                                                 Left brachioradialis 2+  brachioradialis 2+  biceps 2+  biceps 2+  triceps 1+  triceps 1+  patellar trace  patellar 0  ankle jerk 0  ankle jerk 0   SENSORY: Vibration and temperature absent at ankles bilaterally, intact at knees.   Sensation intact in the upper extremities.    COORDINATION/GAIT:  Gait assisted with left AFO and appears stable.   Data: MRI lumbar spine 05/17/2010: 1. Unchanged MRI from 03/22/2010. No epidural abscess  is identified. No compression fracture or acute abnormality. 2. Postoperative changes on the left at L4-L5 and L5-S1. 3. Unchanged left paracentral and posterolateral shallow disc protrusion at L5-S1 potentially affecting the descending left S1 nerve root.  MRI brain wo contrast 05/17/2010: Small white matter lesions in the frontal lobes bilaterally. These are likely  due to chronic microvascular ischemia. Demyelinating disease is not considered likely in this case. No acute abnormality.  EMG of the right lower extremity 03/28/2015: The electrophysiologic findings are most consistent with a severe generalized sensorimotor polyneuropathy, axon loss in type, affecting the right lower extremity. A superimposed multilevel intraspinal canal lesion (i.e. radiculopathy) cannot be excluded.  EMG of the upper extremities 03/21/2015:  The electrophysiologic findings are most consistent with a severe subacute sensorimotor polyradiculoneuropathy affecting the upper extremities  Labs 01/22/2015:  SPEP/UPEP with IFE no M protein, ANA 1:640* homogenous pattern, P-ANCA 1:640*, RF < 10, ACE 46, copper 131, ceruloplasmin 31, ESR 65, vitamin B1 17, CK 24, ENA negative, CRP 15.2*, cryoglobulins none detected, aldolase 8.0  CSF 04/28/2015:  W1  R0  G73  P63*   Cryptococcus neg, ACE 11, one OCB in CSF and serum, MBP <2.0, Lyme neg, IgG index 0.45  Lab Results  Component Value Date   HGBA1C 7.5* 11/11/2015   US carotids 11/24/2015: Mild atherosclerotic disease in the carotid arteries bilaterally. Estimated degree of stenosis in the internal carotid arteries is less than 50% bilaterally. Patent vertebral arteries.  Left thyroid nodule measuring up to 2.2 cm. This nodule meets criteria for biopsy. Recommend dedicated ultrasound of the thyroid to look for additional nodules and consider biopsy of the dominant left thyroid nodule.   CT head wo contrast 11/11/2015: Small rounded focal low density seen in right centrum semiovale concerning for focal infarction of indeterminate age. These results were called by telephone at the time of interpretation on 11/11/2015 at 9:49 am to Dr. Lavonia Drafts , who verbally acknowledged these results.  Lab Results  Component Value Date   CHOL 187 11/11/2015   HDL 48 11/11/2015   LDLCALC 120* 11/11/2015   TRIG 94 11/11/2015   CHOLHDL 3.9 11/11/2015     IMPRESSION/PLAN: 1.  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  Left foot drop due to known history of stroke and diabetic amyotrophy, clinically stable  - Continue AFO and home PT exercises   Return to clinic in 4 months  The duration of this appointment visit was 30 minutes of face-to-face time with the patient.  Greater than 50% of this time was spent in counseling, explanation of diagnosis, planning of further management, and coordination of care.   Thank you for allowing me to participate in patient's care.  If I can answer any additional questions, I would be pleased to do so.    Sincerely,  Donika K. Patel, DO    

## 2016-04-16 ENCOUNTER — Other Ambulatory Visit: Payer: Self-pay | Admitting: Family Medicine

## 2016-04-16 ENCOUNTER — Ambulatory Visit (INDEPENDENT_AMBULATORY_CARE_PROVIDER_SITE_OTHER): Payer: PPO

## 2016-04-16 ENCOUNTER — Ambulatory Visit: Payer: PPO

## 2016-04-16 ENCOUNTER — Other Ambulatory Visit: Payer: Self-pay

## 2016-04-16 ENCOUNTER — Encounter: Payer: Self-pay | Admitting: Family Medicine

## 2016-04-16 ENCOUNTER — Ambulatory Visit (INDEPENDENT_AMBULATORY_CARE_PROVIDER_SITE_OTHER): Payer: PPO | Admitting: Family Medicine

## 2016-04-16 VITALS — BP 112/60 | HR 68 | Temp 97.7°F | Resp 16 | Ht 69.0 in | Wt 277.4 lb

## 2016-04-16 DIAGNOSIS — E041 Nontoxic single thyroid nodule: Secondary | ICD-10-CM

## 2016-04-16 DIAGNOSIS — D509 Iron deficiency anemia, unspecified: Secondary | ICD-10-CM | POA: Diagnosis not present

## 2016-04-16 DIAGNOSIS — Z8673 Personal history of transient ischemic attack (TIA), and cerebral infarction without residual deficits: Secondary | ICD-10-CM

## 2016-04-16 DIAGNOSIS — N182 Chronic kidney disease, stage 2 (mild): Secondary | ICD-10-CM

## 2016-04-16 DIAGNOSIS — E7800 Pure hypercholesterolemia, unspecified: Secondary | ICD-10-CM | POA: Diagnosis not present

## 2016-04-16 DIAGNOSIS — S8252XD Displaced fracture of medial malleolus of left tibia, subsequent encounter for closed fracture with routine healing: Secondary | ICD-10-CM

## 2016-04-16 DIAGNOSIS — G4733 Obstructive sleep apnea (adult) (pediatric): Secondary | ICD-10-CM | POA: Diagnosis not present

## 2016-04-16 DIAGNOSIS — E1142 Type 2 diabetes mellitus with diabetic polyneuropathy: Secondary | ICD-10-CM | POA: Diagnosis not present

## 2016-04-16 DIAGNOSIS — S8252XA Displaced fracture of medial malleolus of left tibia, initial encounter for closed fracture: Secondary | ICD-10-CM | POA: Diagnosis not present

## 2016-04-16 LAB — CBC WITH DIFFERENTIAL/PLATELET
BASOS ABS: 0 {cells}/uL (ref 0–200)
Basophils Relative: 0 %
EOS PCT: 1 %
Eosinophils Absolute: 74 cells/uL (ref 15–500)
HCT: 37.6 % (ref 35.0–45.0)
HEMOGLOBIN: 12.4 g/dL (ref 11.7–15.5)
LYMPHS ABS: 2220 {cells}/uL (ref 850–3900)
LYMPHS PCT: 30 %
MCH: 29.4 pg (ref 27.0–33.0)
MCHC: 33 g/dL (ref 32.0–36.0)
MCV: 89.1 fL (ref 80.0–100.0)
MONOS PCT: 7 %
MPV: 9.7 fL (ref 7.5–12.5)
Monocytes Absolute: 518 cells/uL (ref 200–950)
NEUTROS PCT: 62 %
Neutro Abs: 4588 cells/uL (ref 1500–7800)
Platelets: 203 10*3/uL (ref 140–400)
RBC: 4.22 MIL/uL (ref 3.80–5.10)
RDW: 13.5 % (ref 11.0–15.0)
WBC: 7.4 10*3/uL (ref 3.8–10.8)

## 2016-04-16 LAB — T4, FREE: FREE T4: 1.1 ng/dL (ref 0.8–1.8)

## 2016-04-16 LAB — POCT GLYCOSYLATED HEMOGLOBIN (HGB A1C): Hemoglobin A1C: 7.1

## 2016-04-16 LAB — TSH: TSH: 2.81 m[IU]/L

## 2016-04-16 MED ORDER — ISOSORBIDE MONONITRATE ER 30 MG PO TB24: 30.0000 mg | ORAL_TABLET | Freq: Every day | ORAL | Status: DC

## 2016-04-16 NOTE — Telephone Encounter (Signed)
Refill sent for Isosorbide mono 30 mg take one tablet daily.

## 2016-04-16 NOTE — Patient Instructions (Signed)
     IF you received an x-ray today, you will receive an invoice from [facility]. Please contact  Radiology at [phone] with questions or concerns regarding your invoice.   IF you received labwork today, you will receive an invoice from [facility]. Please contact [facility] at [phone] with questions or concerns regarding your invoice.   Our billing staff will not be able to assist you with questions regarding bills from these companies.  You will be contacted with the lab results as soon as they are available. The fastest way to get your results is to activate your My Chart account. Instructions are located on the last page of this paperwork. If you have not heard from us regarding the results in 2 weeks, please contact this office.      

## 2016-04-16 NOTE — Progress Notes (Signed)
Subjective:    Patient ID: [name], female    DOB: August 14, 1956, 60 y.o.   MRN: [id number]  04/16/2016  Follow-up Anastasia Fiedler)   HPI This 60 y.o. female presents for four month follow-up:   1. DMII: Patient reports good compliance with medication, good tolerance to medication, and good symptom control.  Sugars running labile; will have low sugars in morning; if eats at mother's house, sugars are good.  If eats at home, sugars run 250-280.  Novolog 10 units bid; also doing glyburide before breakfast.  Lantus 15 units at bedtime.  Rarely gets a low.      2.  Hypercholesterolemia: Patient reports good compliance with medication, good tolerance to medication, and good symptom control.     3.  Diabetic neuropathy: Patient reports good compliance with medication, good tolerance to medication, and good symptom control.     4.  Anxiety and depression: Patient reports good compliance with medication, good tolerance to medication, and good symptom control.     5.  Thyroid nodule: never contacted about thyroid US biopsy.   6.  L ankle fracture/nondisplaced fracture of medial malleolus:  Airboot; wore for two weeks and return.  Never returned; wore for 4-5 weeks.   Review of Systems  Constitutional: Negative for fever, chills, diaphoresis and fatigue.  Eyes: Negative for visual disturbance.  Respiratory: Negative for cough and shortness of breath.   Cardiovascular: Positive for leg swelling. Negative for chest pain and palpitations.  Gastrointestinal: Negative for nausea, vomiting, abdominal pain, diarrhea and constipation.  Endocrine: Negative for cold intolerance, heat intolerance, polydipsia, polyphagia and polyuria.  Musculoskeletal: Positive for joint swelling. Negative for arthralgias.  Neurological: Positive for numbness. Negative for dizziness, tremors, seizures, syncope, facial asymmetry, speech difficulty, weakness, light-headedness and headaches.    Past Medical History:    Diagnosis Date  . Acute myocardial infarction, subendocardial infarction, initial episode of care (HCC) 07/21/2012  . Acute osteomyelitis involving ankle and foot (HCC) 02/06/2015  . Acute systolic heart failure (HCC) 07/21/2012   New onset 07/19/12; admission to [facility] ED. Elevated Troponins.  S/p 2D-echo with EF 20-25%.  S/p cardiac catheterization with stenting LAD.  Repeat 2D-echo 10/2011 with improved EF of 35%.   . Anemia   . Automatic implantable cardioverter-defibrillator in situ    a. MDT CRT-D 06/2014, SN: [id number]    . CAD (coronary artery disease)    a. cardiac cath 101/04/2012: PCI/DES to chronically occluded mLAD, consideration PCI to diag branch in 4 weeks.   . Chicken pox   . Chronic systolic CHF (congestive heart failure) (HCC)    a. mixed ICM & NICM; b. EF 20-25% by echo 07/2012, mid-dist 2/3 of LV sev HK/AK, mild MR. echo 10/2012: EF 30-35%, sev HK ant-septal & inf walls, GR1DD, mild MR, PASP 33. c. echo 02/2013: EF 30%, GR1DD, mild MR. echo 04/2014: EF 30%, Septal-lat dyssynchrony, global HK, inf AK, GR1DD, mild MR. d. echo 10/2014: EF50-55%, WM nl, GR1DD, septal mild paradox. e. echo 02/2015: EF 50-55%, wm   . Heart attack (HCC)   . Heel ulcer (HCC) 04/27/2015  . History of blood transfusion ~ 2011   "plasma; had neuropathy; couldn't walk"  . Hypertension   . LBBB (left bundle branch block)   . Neuropathy (HCC) 2011  . Obesity, unspecified   . OSA on CPAP    Moderate with AHI 23/hr and now on CPAP at 16cm H2O.  Her DME is [facility]  . Pure hypercholesterolemia   . Type  II diabetes mellitus (HCC)   . Unspecified vitamin D deficiency    Past Surgical History:  Procedure Laterality Date  . AMPUTATION TOE Right 06/18/2015   Procedure: AMPUTATION TOE;  Surgeon: Gwyneth Revels, DPM;  Location: [facility] ORS;  Service: Podiatry;  Laterality: Right;  . BACK SURGERY    . BI-VENTRICULAR IMPLANTABLE CARDIOVERTER DEFIBRILLATOR N/A 07/06/2014   Procedure: BI-VENTRICULAR IMPLANTABLE  CARDIOVERTER DEFIBRILLATOR  (CRT-D);  Surgeon: Duke Salvia, MD;  Location: [facility] CATH LAB;  Service: Cardiovascular;  Laterality: N/A;  . BI-VENTRICULAR IMPLANTABLE CARDIOVERTER DEFIBRILLATOR  (CRT-D)  07/06/2014  . BILATERAL OOPHORECTOMY  01/2011   ovarian cyst benign  . COLONOSCOPY WITH PROPOFOL Left 02/22/2015   Procedure: COLONOSCOPY WITH PROPOFOL;  Surgeon: Wallace Cullens, MD;  Location: [facility] ENDOSCOPY;  Service: Endoscopy;  Laterality: Left;  . CORONARY ANGIOPLASTY WITH STENT PLACEMENT Left 07/2012   new onset systolic CHF; elevated troponins.  Cardiac catheterization with stenting to LAD; EF 15%.  2D-echo: EF 20-25%.  . ESOPHAGOGASTRODUODENOSCOPY N/A 02/22/2015   Procedure: ESOPHAGOGASTRODUODENOSCOPY (EGD);  Surgeon: Wallace Cullens, MD;  Location: [facility] ENDOSCOPY;  Service: Endoscopy;  Laterality: N/A;  . INCISION AND DRAINAGE ABSCESS Right 2007   groin; with ICU stay due to sepsis.  Marland Kitchen LAPAROSCOPIC CHOLECYSTECTOMY  2011  . LEFT HEART CATHETERIZATION WITH CORONARY ANGIOGRAM N/A 07/21/2012   Procedure: LEFT HEART CATHETERIZATION WITH CORONARY ANGIOGRAM;  Surgeon: Dolores Patty, MD;  Location: [facility] CATH LAB;  Service: Cardiovascular;  Laterality: N/A;  . LUMBAR DISC SURGERY  1996   L4-5  . PERCUTANEOUS CORONARY STENT INTERVENTION (PCI-S) N/A 07/23/2012   Procedure: PERCUTANEOUS CORONARY STENT INTERVENTION (PCI-S);  Surgeon: Tonny Bollman, MD;  Location[facility] CATH LAB;  Service: Cardiovascular;  Laterality: N/A;  . PERIPHERAL VASCULAR CATHETERIZATION N/A 02/10/2015   Procedure: Picc Line Insertion;  Surgeon: Renford Dills, MD;  Location: [facility] INVASIVE CV LAB;  Service: Cardiovascular;  Laterality: N/A;  . TUBAL LIGATION  1981  . VAGINAL HYSTERECTOMY  01/2011   Fibroids/DUB.  Ovaries removed. Fontaine.   No Known Allergies Current Outpatient Prescriptions  Medication Sig Dispense Refill  . Calcium Carb-Cholecalciferol (CALCIUM 600 + D PO) Take 1 tablet by mouth 2 (two) times daily.    .  citalopram (CELEXA) 10 MG tablet Take 1 tablet (10 mg total) by mouth daily. 90 tablet 1  . clopidogrel (PLAVIX) 75 MG tablet Take 1 tablet (75 mg total) by mouth daily. 90 tablet 3  . glimepiride (AMARYL) 4 MG tablet Take 1 tablet (4 mg total) by mouth 2 (two) times daily with a meal. 30 tablet 3  . insulin aspart (NOVOLOG) 100 UNIT/ML FlexPen Inject 5 Units into the skin 3 (three) times daily with meals. 15 mL 11  . Insulin Glargine (LANTUS) 100 UNIT/ML Solostar Pen Inject 15 Units into the skin at bedtime. 15 mL 11  . isosorbide mononitrate (IMDUR) 30 MG 24 hr tablet Take 1 tablet (30 mg total) by mouth daily. 30 tablet 6  . MULTIPLE VITAMIN PO Take 1 tablet by mouth every morning.     . Omega-3 Fatty Acids (FISH OIL) 1000 MG CAPS Take 1,000 mg by mouth 2 (two) times daily.     Marland Kitchen OVER THE COUNTER MEDICATION     . pravastatin (PRAVACHOL) 40 MG tablet Take 1 tablet (40 mg total) by mouth at bedtime. 90 tablet 3  . torsemide (DEMADEX) 20 MG tablet Take 1 tablet (20 mg total) by mouth daily. 30 tablet 11  . traMADol (ULTRAM) 50 MG tablet  TAKE 1 TABLET EVERY 6 HOURS AS NEEDED FOR PAIN 120 tablet 5  . AMBULATORY NON FORMULARY MEDICATION 1 Units by Other route once. Medication Name: foot brace (Patient not taking: Reported on 02/19/2016) 1 Units 0  . carvedilol (COREG) 3.125 MG tablet Take 1 tablet (3.125 mg total) by mouth 2 (two) times daily. 60 tablet 3  . cephALEXin (KEFLEX) 500 MG capsule Take 1 capsule (500 mg total) by mouth 4 (four) times daily. 40 capsule 0  . gabapentin (NEURONTIN) 600 MG tablet TAKE 1 TABLET BY MOUTH 3 TIMES DAILY 270 tablet 0  . glucose blood test strip Use as instructed DX E11.42 100 each 4  . mupirocin ointment (BACTROBAN) 2 % Apply to affected area 3 times daily 22 g 0  . nitroGLYCERIN (NITROSTAT) 0.4 MG SL tablet Place under the tongue. Reported on 04/16/2016     No current facility-administered medications for this visit.    Social History   Social History  .  Marital status: Married    Spouse name: N/A  . Number of children: 2  . Years of education: 12   Occupational History  . disabled Unemployed    03/2010 for peripheral neuropathy  . home daycare     x 20 yrs.   Social History Main Topics  . Smoking status: Never Smoker  . Smokeless tobacco: Never Used  . Alcohol use No  . Drug use: No  . Sexual activity: No   Other Topics Concern  . Not on file   Social History Narrative   Marital status:Married x 42 yrs. Happily married, no abuse.       Children:  2 children (daughter 9, son 67). One grandson and 2 step grandchildren. Daughter and grandson live in home with pt and spouse.       Lives: with husband.      Employment: disability for peripheral neuropathy 2012.  Previously had home daycare.      Tobacco: none      Alcohol: none      Drugs: none      Exercise: none.  [employer].      Pets: dog.      Always uses seat belts, smoke detectors in home.      No guns in the home.       Caffeine use: 2 cups coffee per day.       Nutrition: Well balanced diet.   Family History  Problem Relation Age of Onset  . Diabetes Mother   . Hypertension Mother   . Arthritis Mother     knees, lumbar DDD, cervical DDD  . Cancer Father     prostate,skin,lymphoma.  . Obesity Brother   . Diabetes Maternal Grandmother   . Diabetes Paternal Grandmother   . Diabetes Son   . Hypertension Son        Objective:    BP 112/60   Pulse 68   Temp 97.7 F (36.5 C) (Oral)   Resp 16   Ht 5\' 9"  (1.753 m)   Wt 277 lb 6.4 oz (125.8 kg)   SpO2 98%   BMI 40.96 kg/m  Physical Exam  Constitutional: She is oriented to person, place, and time. She appears well-developed and well-nourished. No distress.  HENT:  Head: Normocephalic and atraumatic.  Eyes: Conjunctivae are normal. Pupils are equal, round, and reactive to light.  Neck: Normal range of motion. Neck supple.  Cardiovascular: Normal rate, regular rhythm and normal heart sounds.   Exam reveals no  gallop and no friction rub.   No murmur heard. Pulmonary/Chest: Effort normal and breath sounds normal. She has no wheezes. She has no rales.  Neurological: She is alert and oriented to person, place, and time.  Skin: She is not diaphoretic.  Psychiatric: She has a normal mood and affect. Her behavior is normal.  Nursing note and vitals reviewed.        Assessment & Plan:   1. Diabetic peripheral neuropathy associated with type 2 diabetes mellitus (HCC)   2. Pure hypercholesterolemia   3. Iron deficiency anemia   4. OSA (obstructive sleep apnea)   5. Chronic renal insufficiency, stage 2 (mild)   6. History of CVA (cerebrovascular accident)   7. Closed disp fracture of left medial malleolus with routine healing   8. Thyroid nodule    -stable. -repeat imaging of Left medial malleolus fracture stable and healing slowly. -obtain labs. -coordinate thyroid biopsy which has been delayed in scheduling.  Orders Placed This Encounter  Procedures  . DG Ankle Complete Left    Standing Status:   Future    Number of Occurrences:   1    Standing Expiration Date:   04/16/2017    Order Specific Question:   Reason for Exam (SYMPTOM  OR DIAGNOSIS REQUIRED)    Answer:   L medial malleolus fracture; persistent swelling    Order Specific Question:   Preferred imaging location?    Answer:   External  . CBC with Differential/Platelet  . Comprehensive metabolic panel    Order Specific Question:   Has the patient fasted?    Answer:   Yes  . Lipid panel    Order Specific Question:   Has the patient fasted?    Answer:   Yes  . TSH  . T4, free  . POCT glycosylated hemoglobin (Hb A1C)   No orders of the defined types were placed in this encounter.   Return in about 3 months (around 07/17/2016) for recheck.    Adilene Areola Paulita Fujita, M.D. [facility] [address] [place], [place]  [address] [phone] phone [phone] fax

## 2016-04-17 LAB — LIPID PANEL
Cholesterol: 155 mg/dL (ref 125–200)
HDL: 40 mg/dL — ABNORMAL LOW (ref >=46)
LDL CALC: 72 mg/dL (ref <130)
TRIGLYCERIDES: 213 mg/dL — AB (ref <150)
Total CHOL/HDL Ratio: 3.9 Ratio (ref <=5.0)
VLDL: 43 mg/dL — ABNORMAL HIGH (ref <30)

## 2016-04-17 LAB — COMPREHENSIVE METABOLIC PANEL
ALBUMIN: 4.1 g/dL (ref 3.6–5.1)
ALT: 13 U/L (ref 6–29)
AST: 20 U/L (ref 10–35)
Alkaline Phosphatase: 113 U/L (ref 33–130)
BUN: 32 mg/dL — ABNORMAL HIGH (ref 7–25)
CALCIUM: 9.6 mg/dL (ref 8.6–10.4)
CHLORIDE: 101 mmol/L (ref 98–110)
CO2: 27 mmol/L (ref 20–31)
CREATININE: 1.53 mg/dL — AB (ref 0.50–0.99)
GLUCOSE: 63 mg/dL — AB (ref 65–99)
Potassium: 5 mmol/L (ref 3.5–5.3)
SODIUM: 142 mmol/L (ref 135–146)
Total Bilirubin: 0.4 mg/dL (ref 0.2–1.2)
Total Protein: 7.4 g/dL (ref 6.1–8.1)

## 2016-04-19 NOTE — Progress Notes (Signed)
Addendum  Discussed with Dr. Fletcher Anon and she does not need DAPT from a cardiac standpoint, so will continue monotherapy with plavix 75mg  daily for secondary stroke prevention.  Vastie Douty K. Posey Pronto, DO

## 2016-04-19 NOTE — Progress Notes (Signed)
Patient notified

## 2016-04-19 NOTE — Addendum Note (Signed)
Addended by: Alda Berthold on: 04/19/2016 08:53 AM   Modules accepted: Orders, Medications

## 2016-04-23 ENCOUNTER — Ambulatory Visit (INDEPENDENT_AMBULATORY_CARE_PROVIDER_SITE_OTHER): Payer: PPO | Admitting: *Deleted

## 2016-04-23 DIAGNOSIS — I5022 Chronic systolic (congestive) heart failure: Secondary | ICD-10-CM | POA: Diagnosis not present

## 2016-04-23 DIAGNOSIS — I255 Ischemic cardiomyopathy: Secondary | ICD-10-CM

## 2016-04-23 DIAGNOSIS — Z95810 Presence of automatic (implantable) cardiac defibrillator: Secondary | ICD-10-CM | POA: Diagnosis not present

## 2016-04-23 NOTE — Progress Notes (Signed)
Remote ICD transmission.   

## 2016-04-25 ENCOUNTER — Encounter: Payer: Self-pay | Admitting: Cardiology

## 2016-04-25 LAB — CUP PACEART REMOTE DEVICE CHECK
Brady Statistic AP VS Percent: 0.1 %
Brady Statistic AS VS Percent: 0.2 %
Date Time Interrogation Session: 20170720155520
Implantable Lead Location: 753859
Implantable Lead Model: 5076
Lead Channel Impedance Value: 513 Ohm
Lead Channel Sensing Intrinsic Amplitude: 1.8 mV
Lead Channel Sensing Intrinsic Amplitude: 20 mV
MDC IDC LEAD IMPLANT DT: 20150930
MDC IDC LEAD IMPLANT DT: 20150930
MDC IDC LEAD IMPLANT DT: 20150930
MDC IDC LEAD LOCATION: 753857
MDC IDC LEAD LOCATION: 753860
MDC IDC LEAD MODEL: 4398
MDC IDC MSMT LEADCHNL LV IMPEDANCE VALUE: 456 Ohm
MDC IDC MSMT LEADCHNL RA IMPEDANCE VALUE: 399 Ohm
MDC IDC MSMT LEADCHNL RA PACING THRESHOLD PULSEWIDTH: 0.4 ms
MDC IDC MSMT LEADCHNL RV PACING THRESHOLD AMPLITUDE: 0.75 V
MDC IDC MSMT LEADCHNL RV PACING THRESHOLD PULSEWIDTH: 0.4 ms
MDC IDC STAT BRADY AP VP PERCENT: 11 %
MDC IDC STAT BRADY AS VP PERCENT: 88.8 %

## 2016-04-30 ENCOUNTER — Other Ambulatory Visit: Payer: Self-pay | Admitting: *Deleted

## 2016-04-30 MED ORDER — CARVEDILOL 3.125 MG PO TABS: 3.1250 mg | ORAL_TABLET | Freq: Two times a day (BID) | ORAL | 3 refills | Status: DC

## 2016-05-03 ENCOUNTER — Emergency Department
Admission: EM | Admit: 2016-05-03 | Discharge: 2016-05-03 | Disposition: A | Discharge status: Home / Self Care | Payer: PPO | Attending: Emergency Medicine | Admitting: Emergency Medicine

## 2016-05-03 ENCOUNTER — Emergency Department: Payer: PPO

## 2016-05-03 DIAGNOSIS — N189 Chronic kidney disease, unspecified: Secondary | ICD-10-CM | POA: Diagnosis not present

## 2016-05-03 DIAGNOSIS — Z23 Encounter for immunization: Secondary | ICD-10-CM | POA: Insufficient documentation

## 2016-05-03 DIAGNOSIS — I130 Hypertensive heart and chronic kidney disease with heart failure and stage 1 through stage 4 chronic kidney disease, or unspecified chronic kidney disease: Secondary | ICD-10-CM | POA: Insufficient documentation

## 2016-05-03 DIAGNOSIS — S99921A Unspecified injury of right foot, initial encounter: Secondary | ICD-10-CM | POA: Diagnosis not present

## 2016-05-03 DIAGNOSIS — Z7984 Long term (current) use of oral hypoglycemic drugs: Secondary | ICD-10-CM | POA: Insufficient documentation

## 2016-05-03 DIAGNOSIS — Z955 Presence of coronary angioplasty implant and graft: Secondary | ICD-10-CM | POA: Insufficient documentation

## 2016-05-03 DIAGNOSIS — Y999 Unspecified external cause status: Secondary | ICD-10-CM | POA: Insufficient documentation

## 2016-05-03 DIAGNOSIS — S91312A Laceration without foreign body, left foot, initial encounter: Secondary | ICD-10-CM | POA: Diagnosis not present

## 2016-05-03 DIAGNOSIS — E1122 Type 2 diabetes mellitus with diabetic chronic kidney disease: Secondary | ICD-10-CM | POA: Insufficient documentation

## 2016-05-03 DIAGNOSIS — Z95810 Presence of automatic (implantable) cardiac defibrillator: Secondary | ICD-10-CM | POA: Insufficient documentation

## 2016-05-03 DIAGNOSIS — S96122A Laceration of muscle and tendon of long extensor muscle of toe at ankle and foot level, left foot, initial encounter: Secondary | ICD-10-CM | POA: Diagnosis not present

## 2016-05-03 DIAGNOSIS — Y929 Unspecified place or not applicable: Secondary | ICD-10-CM | POA: Diagnosis not present

## 2016-05-03 DIAGNOSIS — I2510 Atherosclerotic heart disease of native coronary artery without angina pectoris: Secondary | ICD-10-CM | POA: Insufficient documentation

## 2016-05-03 DIAGNOSIS — I5022 Chronic systolic (congestive) heart failure: Secondary | ICD-10-CM | POA: Insufficient documentation

## 2016-05-03 DIAGNOSIS — IMO0002 Reserved for concepts with insufficient information to code with codable children: Secondary | ICD-10-CM

## 2016-05-03 DIAGNOSIS — Z8673 Personal history of transient ischemic attack (TIA), and cerebral infarction without residual deficits: Secondary | ICD-10-CM | POA: Insufficient documentation

## 2016-05-03 DIAGNOSIS — S91119A Laceration without foreign body of unspecified toe without damage to nail, initial encounter: Secondary | ICD-10-CM

## 2016-05-03 DIAGNOSIS — Z79899 Other long term (current) drug therapy: Secondary | ICD-10-CM | POA: Insufficient documentation

## 2016-05-03 DIAGNOSIS — Z794 Long term (current) use of insulin: Secondary | ICD-10-CM | POA: Insufficient documentation

## 2016-05-03 DIAGNOSIS — I252 Old myocardial infarction: Secondary | ICD-10-CM | POA: Diagnosis not present

## 2016-05-03 DIAGNOSIS — Y9301 Activity, walking, marching and hiking: Secondary | ICD-10-CM | POA: Insufficient documentation

## 2016-05-03 DIAGNOSIS — X509XXA Other and unspecified overexertion or strenuous movements or postures, initial encounter: Secondary | ICD-10-CM | POA: Insufficient documentation

## 2016-05-03 DIAGNOSIS — M7989 Other specified soft tissue disorders: Secondary | ICD-10-CM | POA: Diagnosis not present

## 2016-05-03 DIAGNOSIS — M79672 Pain in left foot: Secondary | ICD-10-CM | POA: Diagnosis not present

## 2016-05-03 DIAGNOSIS — S99922A Unspecified injury of left foot, initial encounter: Secondary | ICD-10-CM | POA: Diagnosis not present

## 2016-05-03 MED ORDER — LIDOCAINE HCL (PF) 1 % IJ SOLN
INTRAMUSCULAR | Status: AC
  Filled 2016-05-03: qty 5

## 2016-05-03 MED ORDER — TETANUS-DIPHTH-ACELL PERTUSSIS 5-2.5-18.5 LF-MCG/0.5 IM SUSP
INTRAMUSCULAR | Status: AC
  Filled 2016-05-03: qty 0.5

## 2016-05-03 MED ORDER — MUPIROCIN 2 % EX OINT: TOPICAL_OINTMENT | CUTANEOUS | 0 refills | Status: AC

## 2016-05-03 MED ORDER — CEPHALEXIN 500 MG PO CAPS: 500.0000 mg | ORAL_CAPSULE | Freq: Four times a day (QID) | ORAL | 0 refills | Status: AC

## 2016-05-03 MED ORDER — CEPHALEXIN 500 MG PO CAPS
500.0000 mg | ORAL_CAPSULE | Freq: Once | ORAL | Status: AC
  Administered 2016-05-03: 500 mg via ORAL
  Filled 2016-05-03: qty 1

## 2016-05-03 MED ORDER — TETANUS-DIPHTH-ACELL PERTUSSIS 5-2.5-18.5 LF-MCG/0.5 IM SUSP
0.5000 mL | Freq: Once | INTRAMUSCULAR | Status: AC
  Administered 2016-05-03: 0.5 mL via INTRAMUSCULAR

## 2016-05-03 NOTE — ED Triage Notes (Signed)
Pt to triage via wheelchair. Pt reports she has foot drop to her left foot. Pt reports she was walking on the carpet when she caught her 2nd toe on the carpent and pulled her toe under her foot. Pt toe has laceration across the top with bleeding controlled by gauze bandage placed [facility]. Pt reports she is diabetic and has neuropathy in her feet also. Pt reports she has lost a the second toe on her right foot due to infection that developed in an ulcer on it.

## 2016-05-03 NOTE — ED Notes (Signed)
Left second toe and third toe buddy taped together with silk tape.

## 2016-05-03 NOTE — ED Provider Notes (Signed)
[facility] Emergency Department Provider Note        Time seen: ----------------------------------------- 9:24 PM on 05/03/2016 -----------------------------------------    I have reviewed the triage vital signs and the nursing notes.   HISTORY  Chief Complaint Extremity Laceration    HPI [name] is a 60 y.o. female who presents to the ER after a left foot injury. Patient reportedly has a history of foot drop from diabetic neuropathy. She reports she was walking on the carpet when she caught her second toe on the carpet and pulled her toe under the foot, hyperflexing it. Patient's tetanus laceration across the top of that with bleeding controlled. She is brought in by EMS. Reports she is diabetic and has neuropathy in her feet. She had lost second toe on the right foot due to infection. She denies fevers or other complaints.   Past Medical History:  Diagnosis Date  . Acute myocardial infarction, subendocardial infarction, initial episode of care ([place]) 07/21/2012  . Acute osteomyelitis involving ankle and foot ([place]) 02/06/2015  . Acute systolic heart failure ([place]) 07/21/2012   New onset 07/19/12; admission to [facility] ED. Elevated Troponins.  S/p 2D-echo with EF 20-25%.  S/p cardiac catheterization with stenting LAD.  Repeat 2D-echo 10/2011 with improved EF of 35%.   . Anemia   . Automatic implantable cardioverter-defibrillator in situ    a. MDT CRT-D 06/2014, [id number]    . CAD (coronary artery disease)    a. cardiac cath 101/04/2012: PCI/DES to chronically occluded mLAD, consideration PCI to diag branch in 4 weeks.   . Chicken pox   . Chronic systolic CHF (congestive heart failure) (HCC)    a. mixed ICM & NICM; b. EF 20-25% by echo 07/2012, mid-dist 2/3 of LV sev HK/AK, mild MR. echo 10/2012: EF 30-35%, sev HK ant-septal & inf walls, GR1DD, mild MR, PASP 33. c. echo 02/2013: EF 30%, GR1DD, mild MR. echo 04/2014: EF 30%, Septal-lat dyssynchrony,  global HK, inf AK, GR1DD, mild MR. d. echo 10/2014: EF50-55%, WM nl, GR1DD, septal mild paradox. e. echo 02/2015: EF 50-55%, wm   . Heart attack ([place])   . Heel ulcer ([place]) 04/27/2015  . History of blood transfusion ~ 2011   "plasma; had neuropathy; couldn't walk"  . Hypertension   . LBBB (left bundle branch block)   . Neuropathy ([place]) 2011  . Obesity, unspecified   . OSA on CPAP    Moderate with AHI 23/hr and now on CPAP at 16cm H2O.  Her DME is [facility]  . Pure hypercholesterolemia   . Type II diabetes mellitus ([place])   . Unspecified vitamin D deficiency     Patient Active Problem List   Diagnosis Date Noted  . TIA (transient ischemic attack) 11/11/2015  . History of CVA (cerebrovascular accident) 11/11/2015  . Chronic renal insufficiency 08/04/2015  . Diabetes mellitus, type 2 ([place]) 04/27/2015  . GIB (gastrointestinal bleeding) 02/17/2015  . OSA (obstructive sleep apnea) 08/15/2014  . Chronic left ventricular systolic dysfunction 0000000  . Morbid obesity ([place]) 10/26/2013  . Essential hypertension, benign 04/29/2013  . Pure hypercholesterolemia 12/07/2012  . Iron deficiency anemia 12/07/2012  . Diabetic peripheral neuropathy associated with type 2 diabetes mellitus ([place]) 12/07/2012  . Chronic systolic heart failure ([place]) 07/29/2012  . Left bundle branch block 07/25/2012  . CAD in native artery 07/25/2012    Past Surgical History:  Procedure Laterality Date  . AMPUTATION TOE Right 06/18/2015   Procedure: AMPUTATION TOE;  Surgeon: Samara Deist,  DPM;  Location: [facility] ORS;  Service: Podiatry;  Laterality: Right;  . BACK SURGERY    . BI-VENTRICULAR IMPLANTABLE CARDIOVERTER DEFIBRILLATOR N/A 07/06/2014   Procedure: BI-VENTRICULAR IMPLANTABLE CARDIOVERTER DEFIBRILLATOR  (CRT-D);  Surgeon: Deboraha Sprang, MD;  Location: [facility] CATH LAB;  Service: Cardiovascular;  Laterality: N/A;  . BI-VENTRICULAR IMPLANTABLE CARDIOVERTER DEFIBRILLATOR  (CRT-D)  07/06/2014  . BILATERAL OOPHORECTOMY  01/2011    ovarian cyst benign  . COLONOSCOPY WITH PROPOFOL Left 02/22/2015   Procedure: COLONOSCOPY WITH PROPOFOL;  Surgeon: Hulen Luster, MD;  Location: [facility] ENDOSCOPY;  Service: Endoscopy;  Laterality: Left;  . CORONARY ANGIOPLASTY WITH STENT PLACEMENT Left 07/2012   new onset systolic CHF; elevated troponins.  Cardiac catheterization with stenting to LAD; EF 15%.  2D-echo: EF 20-25%.  . ESOPHAGOGASTRODUODENOSCOPY N/A 02/22/2015   Procedure: ESOPHAGOGASTRODUODENOSCOPY (EGD);  Surgeon: Hulen Luster, MD;  Location: [facility] ENDOSCOPY;  Service: Endoscopy;  Laterality: N/A;  . INCISION AND DRAINAGE ABSCESS Right 2007   groin; with ICU stay due to sepsis.  Marland Kitchen LAPAROSCOPIC CHOLECYSTECTOMY  2011  . LEFT HEART CATHETERIZATION WITH CORONARY ANGIOGRAM N/A 07/21/2012   Procedure: LEFT HEART CATHETERIZATION WITH CORONARY ANGIOGRAM;  Surgeon: Jolaine Artist, MD;  Location: [facility] CATH LAB;  Service: Cardiovascular;  Laterality: N/A;  . [place]   L4-5  . PERCUTANEOUS CORONARY STENT INTERVENTION (PCI-S) N/A 07/23/2012   Procedure: PERCUTANEOUS CORONARY STENT INTERVENTION (PCI-S);  Surgeon: Sherren Mocha, MD;  Location: [facility] CATH LAB;  Service: Cardiovascular;  Laterality: N/A;  . PERIPHERAL VASCULAR CATHETERIZATION N/A 02/10/2015   Procedure: Picc Line Insertion;  Surgeon: Katha Cabal, MD;  Location: [place] CV LAB;  Service: Cardiovascular;  Laterality: N/A;  . TUBAL LIGATION  1981  . VAGINAL HYSTERECTOMY  01/2011   Fibroids/DUB.  Ovaries removed. Fontaine.    Allergies Review of patient's allergies indicates no known allergies.  Social History Social History  Substance Use Topics  . Smoking status: Never Smoker  . Smokeless tobacco: Never Used  . Alcohol use No    Review of Systems Constitutional: Negative for fever. Musculoskeletal: Negative for back pain, negative for foot pain Skin: Positive for laceration Neurological: Negative for headaches, positive for chronic  numbness  10-point ROS otherwise negative.  ____________________________________________   PHYSICAL EXAM:  VITAL SIGNS: ED Triage Vitals  Enc Vitals Group     BP 05/03/16 2021 (!) 145/59     Pulse Rate 05/03/16 2021 66     Resp 05/03/16 2021 20     Temp 05/03/16 2021 98.3 F (36.8 C)     Temp Source 05/03/16 2021 Oral     SpO2 05/03/16 2021 96 %     Weight 05/03/16 2021 274 lb (124.3 kg)     Height 05/03/16 2021 5\' 8"  (1.727 m)     Head Circumference --      Peak Flow --      Pain Score 05/03/16 2030 0     Pain Loc --      Pain Edu? --      Excl. in [place]? --     Constitutional: Alert and oriented. Well appearing and in no distress. Musculoskeletal: Patient sustained a laceration across the dorsum of the left second digit on her foot. Second digit appears to be in some degree of unopposed flexion. There appears to be extensor tendon involvement. Patient chronically cannot flex or extend the toes of the left foot Neurologic:  Normal speech and language. No gross focal neurologic deficits are appreciated.  Skin:  3 cm laceration is noted over the dorsum of the mid left second digit on the left foot. Psychiatric: Mood and affect are normal. Speech and behavior are normal.   ____________________________________________  ED COURSE:  Pertinent labs & imaging results that were available during my care of the patient were reviewed by me and considered in my medical decision making (see chart for details). Patients in no acute distress, has evidence of laceration with tendon involvement and left foot.  LACERATION REPAIR Performed by: Earleen Newport Authorized by: Lenise Arena E Consent: Verbal consent obtained. Risks and benefits: risks, benefits and alternatives were discussed Consent given by: patient Patient identity confirmed: provided demographic data Prepped and Draped in normal sterile fashion Wound explored  Laceration Location: Left foot, second  digit  Laceration Length: 3 cm  No Foreign Bodies seen or palpated  Anesthesia: local infiltration  Local anesthetic: lidocaine 1 % without epinephrine  Anesthetic total: 5 ml  Irrigation method: syringe Amount of cleaning: standard  Skin closure: 4-0 Ethilon   Number of sutures: 7   Technique: Standard interrupted   Patient tolerance: Patient tolerated the procedure well with no immediate complications. I was able to suture the wound closed with good cosmetic result. Sutures were placed deeply to involve the underlying tendon. ____________________________________________   RADIOLOGY Images were viewed by me  IMPRESSION: Soft tissue swelling without evidence of acute bony abnormality. Degenerative changes.  ____________________________________________  FINAL ASSESSMENT AND PLAN  Laceration with tendon involvement  Plan: Patient with imaging as dictated above. Patient is in no acute distress, she will follow up with podiatry as an outpatient. We will buddy tape the digit to the adjacent toe. She'll be given Keflex and Bactroban.   Earleen Newport, MD   Note: This dictation was prepared with Dragon dictation. Any transcriptional errors that result from this process are unintentional    Earleen Newport, MD 05/03/16 2129

## 2016-05-06 DIAGNOSIS — S91215D Laceration without foreign body of left lesser toe(s) with damage to nail, subsequent encounter: Secondary | ICD-10-CM | POA: Diagnosis not present

## 2016-05-09 DIAGNOSIS — E113211 Type 2 diabetes mellitus with mild nonproliferative diabetic retinopathy with macular edema, right eye: Secondary | ICD-10-CM | POA: Diagnosis not present

## 2016-05-09 DIAGNOSIS — H43811 Vitreous degeneration, right eye: Secondary | ICD-10-CM | POA: Diagnosis not present

## 2016-05-09 DIAGNOSIS — E113292 Type 2 diabetes mellitus with mild nonproliferative diabetic retinopathy without macular edema, left eye: Secondary | ICD-10-CM | POA: Diagnosis not present

## 2016-05-09 DIAGNOSIS — H25813 Combined forms of age-related cataract, bilateral: Secondary | ICD-10-CM | POA: Diagnosis not present

## 2016-05-09 DIAGNOSIS — H524 Presbyopia: Secondary | ICD-10-CM | POA: Diagnosis not present

## 2016-05-22 DIAGNOSIS — S91215D Laceration without foreign body of left lesser toe(s) with damage to nail, subsequent encounter: Secondary | ICD-10-CM | POA: Diagnosis not present

## 2016-05-27 ENCOUNTER — Ambulatory Visit (INDEPENDENT_AMBULATORY_CARE_PROVIDER_SITE_OTHER): Payer: PPO | Admitting: Cardiovascular Disease

## 2016-05-27 ENCOUNTER — Encounter: Payer: Self-pay | Admitting: Cardiovascular Disease

## 2016-05-27 VITALS — BP 100/60 | Ht 68.0 in | Wt 275.2 lb

## 2016-05-27 DIAGNOSIS — I5022 Chronic systolic (congestive) heart failure: Secondary | ICD-10-CM

## 2016-05-27 DIAGNOSIS — I2510 Atherosclerotic heart disease of native coronary artery without angina pectoris: Secondary | ICD-10-CM | POA: Diagnosis not present

## 2016-05-27 DIAGNOSIS — E785 Hyperlipidemia, unspecified: Secondary | ICD-10-CM | POA: Diagnosis not present

## 2016-05-27 NOTE — Progress Notes (Signed)
Cardiology Office Note   Date:  05/27/2016   ID:  [name], DOB 09-Aug-1956, MRN [id number]  PCP:  [name], MD  Cardiologist:   [name], MD   Chief Complaint  Patient presents with  . Other    6 month follow up. Meds reviewed by the patient verbally. "doing well."       History of Present Illness: [name] is a 60 y.o. female who presents for a follow-up visit regarding  chronic systolic heart failure, coronary artery disease with previous LAD stenting in 2013, HL, morbid obesity, chronic pain and DM since she was 60 years old with chronic kidney disease. She had neuropathy in 2011 causing her to be in a wheelchair, this recovered after 8 months of therapy.  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  She is able to walk normally now.  Previous ejection fraction in 2011 was 20%. Status post Medtronic CRT-D implantation 9/15.  She has been unable to tolerate spironolactone or lisinopril due to hyperkalemia.  She was hospitalized in May,2016 at [facility] for symptomatic anemia. Hemoglobin was 7.3.  This improved with IV iron. Peripheral arterial dopplers 8/15 with normal ABIs bilaterally  Sleep study 11/15 with severe OSA  Lexiscan Cardiolite in 3/16 with EF 50%, small apical infarct with mild peri-infarct ischemia, low risk study.   Most recent echocardiogram in January 2017 showed an ejection fraction of 50%. She had a small stroke in early 2007 due to suspected small vessel disease. She has been doing well since her last visit with no chest pain. Dyspnea is stable. No leg edema or orthopnea.   Past Medical History:  Diagnosis Date  . Acute myocardial infarction, subendocardial infarction, initial episode of care ([place]) 07/21/2012  . Acute osteomyelitis involving ankle and foot ([place]) 02/06/2015  . Acute systolic heart failure ([place]) 07/21/2012   New onset 07/19/12; admission to [facility] ED. Elevated Troponins.  S/p 2D-echo with EF 20-25%.  S/p cardiac catheterization with stenting LAD.  Repeat 2D-echo 10/2011 with improved EF of 35%.   . Anemia   . Automatic implantable cardioverter-defibrillator in situ    a. MDT CRT-D 06/2014, [id number]    . CAD (coronary artery disease)    a. cardiac cath 101/04/2012: PCI/DES to chronically occluded mLAD, consideration PCI to diag branch in 4 weeks.   . Chicken pox   . Chronic systolic CHF (congestive heart failure) (HCC)    a. mixed ICM & NICM; b. EF 20-25% by echo 07/2012, mid-dist 2/3 of LV sev HK/AK, mild MR. echo 10/2012: EF 30-35%, sev HK ant-septal & inf walls, GR1DD, mild MR, PASP 33. c. echo 02/2013: EF 30%, GR1DD, mild MR. echo 04/2014: EF 30%, Septal-lat dyssynchrony, global HK, inf AK, GR1DD, mild MR. d. echo 10/2014: EF50-55%, WM nl, GR1DD, septal mild paradox. e. echo 02/2015: EF 50-55%, wm   . Heart attack ([place])   . Heel ulcer ([place]) 04/27/2015  . History of blood transfusion ~ 2011   "plasma; had neuropathy; couldn't walk"  . Hypertension   . LBBB (left bundle branch block)   . Neuropathy ([place]) 2011  . Obesity, unspecified   . OSA on CPAP    Moderate with AHI 23/hr and now on CPAP at 16cm H2O.  Her DME is [facility]  . Pure hypercholesterolemia   . Type II diabetes mellitus ([place])   . Unspecified vitamin D deficiency     Past Surgical History:  Procedure Laterality Date  . AMPUTATION TOE Right 06/18/2015   Procedure: AMPUTATION TOE;  Surgeon: Samara Deist, DPM;  Location: [facility] ORS;  Service: Podiatry;  Laterality: Right;  . BACK SURGERY    . BI-VENTRICULAR IMPLANTABLE CARDIOVERTER DEFIBRILLATOR N/A 07/06/2014   Procedure: BI-VENTRICULAR IMPLANTABLE CARDIOVERTER DEFIBRILLATOR  (CRT-D);  Surgeon: Deboraha Sprang, MD;  Location: [facility] CATH LAB;  Service: Cardiovascular;  Laterality: N/A;  . BI-VENTRICULAR IMPLANTABLE CARDIOVERTER DEFIBRILLATOR  (CRT-D)  07/06/2014  . BILATERAL OOPHORECTOMY  01/2011   ovarian cyst benign  .  COLONOSCOPY WITH PROPOFOL Left 02/22/2015   Procedure: COLONOSCOPY WITH PROPOFOL;  Surgeon: Hulen Luster, MD;  Location: [facility] ENDOSCOPY;  Service: Endoscopy;  Laterality: Left;  . CORONARY ANGIOPLASTY WITH STENT PLACEMENT Left 07/2012   new onset systolic CHF; elevated troponins.  Cardiac catheterization with stenting to LAD; EF 15%.  2D-echo: EF 20-25%.  . ESOPHAGOGASTRODUODENOSCOPY N/A 02/22/2015   Procedure: ESOPHAGOGASTRODUODENOSCOPY (EGD);  Surgeon: Hulen Luster, MD;  [facility] END[facility]COPY;  Service: Endoscopy;  Laterality: N/A;  . INCISION AND DRAINAGE ABSCESS Right 2007   groin; with ICU stay due to sepsis.  Marland Kitchen LAPAROSCOPIC CHOLECYSTECTOMY  2011  . LEFT HEART CATHETERIZATION WITH CORONARY ANGIOGRAM N/A 07/21/2012   Procedure: LEFT HEART CATHETERIZATION WITH CORONARY ANGIOGRAM;  Surgeon: Jolaine Artist, MD;  Location[facility] CATH LAB;  Service: Cardiovascular;  Laterality: N/A;  . [place]   L4-5  . PERCUTANEOUS CORONARY STENT INTERVENTION (PCI-S) N/A 07/23/2012   Procedure: PERCUTANEOUS CORONARY STENT INTERVENTION (PCI-S);  Surgeon: Sherren Mocha, MD;  Location: [facility] CATH LAB;  Service: Cardiovascular;  Laterality: N/A;  . PERIPHERAL VASCULAR CATHETERIZATION N/A 02/10/2015   Procedure: Picc Line Insertion;  Surgeon: Katha Cabal, MD;  Location: [place] CV LAB;  Service: Cardiovascular;  Laterality: N/A;  . TUBAL LIGATION  1981  . VAGINAL HYSTERECTOMY  01/2011   Fibroids/DUB.  Ovaries removed. [place].     Current Outpatient Prescriptions  Medication Sig Dispense Refill  . AMBULATORY NON FORMULARY MEDICATION 1 Units by Other route once. Medication Name: foot brace 1 Units 0  . Calcium Carb-Cholecalciferol (CALCIUM 600 + D PO) Take 1 tablet by mouth 2 (two) times daily.    . carvedilol (COREG) 3.125 MG tablet Take 1 tablet (3.125 mg total) by mouth 2 (two) times daily. 60 tablet 3  . citalopram (CELEXA) 10 MG tablet Take 1 tablet (10 mg total) by mouth daily. 90  tablet 1  . clopidogrel (PLAVIX) 75 MG tablet Take 1 tablet (75 mg total) by mouth daily. 90 tablet 3  . gabapentin (NEURONTIN) 600 MG tablet TAKE 1 TABLET BY MOUTH 3 TIMES DAILY 270 tablet 0  . glimepiride (AMARYL) 4 MG tablet Take 1 tablet (4 mg total) by mouth 2 (two) times daily with a meal. 30 tablet 3  . glucose blood test strip Use as instructed DX E11.42 100 each 4  . insulin aspart (NOVOLOG) 100 UNIT/ML FlexPen Inject 5 Units into the skin 3 (three) times daily with meals. 15 mL 11  . Insulin Glargine (LANTUS) 100 UNIT/ML Solostar Pen Inject 15 Units into the skin at bedtime. 15 mL 11  . isosorbide mononitrate (IMDUR) 30 MG 24 hr tablet Take 1 tablet (30 mg total) by mouth daily. 30 tablet 6  . MULTIPLE VITAMIN PO Take 1 tablet by mouth every morning.     . mupirocin ointment (BACTROBAN) 2 % Apply to affected area 3 times daily 22 g 0  . nitroGLYCERIN (NITROSTAT) 0.4 MG  SL tablet Place under the tongue. Reported on 04/16/2016    . Omega-3 Fatty Acids (FISH OIL) 1000 MG CAPS Take 1,000 mg by mouth 2 (two) times daily.     Marland Kitchen OVER THE COUNTER MEDICATION     . pravastatin (PRAVACHOL) 40 MG tablet Take 1 tablet (40 mg total) by mouth at bedtime. 90 tablet 3  . torsemide (DEMADEX) 20 MG tablet Take 1 tablet (20 mg total) by mouth daily. 30 tablet 11  . traMADol (ULTRAM) 50 MG tablet TAKE 1 TABLET EVERY 6 HOURS AS NEEDED FOR PAIN 120 tablet 5   No current facility-administered medications for this visit.     Allergies:   Review of patient's allergies indicates no known allergies.    Social History:  The patient  reports that she has never smoked. She has never used smokeless tobacco. She reports that she does not drink alcohol or use drugs.   Family History:  The patient's family history includes Arthritis in her mother; Cancer in her father; Diabetes in her maternal grandmother, mother, paternal grandmother, and son; Hypertension in her mother and son; Obesity in her brother.    ROS:   Please see the history of present illness.   Otherwise, review of systems are positive for none.   All other systems are reviewed and negative.    PHYSICAL EXAM: VS:  BP 100/60 (BP Location: Left Arm, Patient Position: Sitting, Cuff Size: Large)   Ht 5\' 8"  (1.727 m)   Wt 275 lb 4 oz (124.9 kg)   BMI 41.85 kg/m  , BMI Body mass index is 41.85 kg/m. GEN: Well nourished, well developed, in no acute distress  HEENT: normal  Neck: no JVD, carotid bruits, or masses Cardiac: RRR; no murmurs, rubs, or gallops,no edema  Respiratory:  clear to auscultation bilaterally, normal work of breathing GI: soft, nontender, nondistended, + BS MS: no deformity or atrophy  Skin: warm and dry, no rash Neuro:  Strength and sensation are intact Psych: euthymic mood, full affect   EKG:  EKG is ordered today. The ekg ordered today demonstrates atrial sensed ventricular paced rhythm   Recent Labs: 04/16/2016: ALT 13; BUN 32; Creat 1.53; Hemoglobin 12.4; Platelets 203; Potassium 5.0; Sodium 142; TSH 2.81    Lipid Panel    Component Value Date/Time   CHOL 155 04/16/2016 1650   TRIG 213 (H) 04/16/2016 1650   HDL 40 (L) 04/16/2016 1650   CHOLHDL 3.9 04/16/2016 1650   VLDL 43 (H) 04/16/2016 1650   LDLCALC 72 04/16/2016 1650      Wt Readings from Last 3 Encounters:  05/27/16 275 lb 4 oz (124.9 kg)  05/03/16 274 lb (124.3 kg)  04/16/16 277 lb 6.4 oz (125.8 kg)        ASSESSMENT AND PLAN:  1.  Chronic systolic heart failure due to ischemic cardiomyopathy with most recent ejection fraction of 50%. She is currently New York Heart Association class II. Her symptoms improved significantly after her anemia was treated. She is not on ACE inhibitor or spironolactone due to previous severe hyperkalemia in the setting of mild chronic kidney disease. Also her blood pressure is chronically on the low side. She is only on small dose carvedilol.  2. Coronary artery disease: Status post LAD PCI in October 2013.  No anginal symptoms. Continue medical therapy.  3. Hyperlipidemia: Currently on pravastatin. Most recent LDL was 72.    Disposition:   FU with me in 6 months  Signed,  [name], MD  05/27/2016 1:53 PM    [facility]

## 2016-05-27 NOTE — Patient Instructions (Signed)
Medication Instructions: Continue same medications.   Labwork: None.   Procedures/Testing: None.   Follow-Up: 6 months with Dr. Jerryl Holzhauer.   Any Additional Special Instructions Will Be Listed Below (If Applicable).     If you need a refill on your cardiac medications before your next appointment, please call your pharmacy.   

## 2016-05-28 ENCOUNTER — Other Ambulatory Visit: Payer: Self-pay | Admitting: Family Medicine

## 2016-05-28 DIAGNOSIS — E119 Type 2 diabetes mellitus without complications: Secondary | ICD-10-CM

## 2016-06-13 ENCOUNTER — Encounter: Payer: Self-pay | Admitting: Family Medicine

## 2016-06-19 ENCOUNTER — Telehealth: Payer: Self-pay

## 2016-06-19 DIAGNOSIS — E041 Nontoxic single thyroid nodule: Secondary | ICD-10-CM

## 2016-06-19 NOTE — Telephone Encounter (Signed)
Call --- I have ordered thyroid US three times in EPIC.  I signed another order in EPIC today.  Please confirm that Pamala Hurry at [facility] has received my signed order in EPIC.

## 2016-06-19 NOTE — Telephone Encounter (Signed)
Order pended

## 2016-06-19 NOTE — Telephone Encounter (Signed)
Barbara with [facility] called to request an order for a thyroid biopsy for the patient.  The order must state the reason for the biopsy and must be e-signed by the provider or an order can be faxed with the provider signature to [phone].

## 2016-06-21 NOTE — Telephone Encounter (Signed)
[facility] has order.

## 2016-06-24 ENCOUNTER — Telehealth: Payer: Self-pay | Admitting: Emergency Medicine

## 2016-06-24 ENCOUNTER — Telehealth: Payer: Self-pay

## 2016-06-24 NOTE — Telephone Encounter (Signed)
Left a message for Pamala Hurry to instruct patient to hold Plavix for seven days prior to procedure and restart 3 days after procedure per Dr. Tamala Julian.

## 2016-06-24 NOTE — Telephone Encounter (Signed)
Barbara from [facility] is calling because the patient is scheduled for a thyroid biopsy and needs to know if the dosage for plavix needs adjusting. Pamala Hurry states that this needs to be addressed soon as possible.

## 2016-06-24 NOTE — Telephone Encounter (Signed)
[phone] °

## 2016-06-24 NOTE — Telephone Encounter (Signed)
Clarification: Pt to hold Plavix for five days per Dr. Tamala Julian She is scheduled for biopsy 9/25

## 2016-06-25 DIAGNOSIS — Z89421 Acquired absence of other right toe(s): Secondary | ICD-10-CM | POA: Diagnosis not present

## 2016-06-25 DIAGNOSIS — Z794 Long term (current) use of insulin: Secondary | ICD-10-CM | POA: Diagnosis not present

## 2016-06-25 DIAGNOSIS — E11621 Type 2 diabetes mellitus with foot ulcer: Secondary | ICD-10-CM | POA: Diagnosis not present

## 2016-06-25 DIAGNOSIS — L03031 Cellulitis of right toe: Secondary | ICD-10-CM | POA: Diagnosis not present

## 2016-06-25 DIAGNOSIS — L97509 Non-pressure chronic ulcer of other part of unspecified foot with unspecified severity: Secondary | ICD-10-CM | POA: Diagnosis not present

## 2016-06-25 DIAGNOSIS — B351 Tinea unguium: Secondary | ICD-10-CM | POA: Diagnosis not present

## 2016-06-26 NOTE — Telephone Encounter (Signed)
Corrected instructions; fine to HOLD Plavix for FIVE days prior to procedure.  Please confirm that patient received these instructions.

## 2016-06-27 ENCOUNTER — Telehealth: Payer: Self-pay | Admitting: Cardiovascular Disease

## 2016-06-27 NOTE — Telephone Encounter (Signed)
Nurse with [facility] is asking if pt can stop her Plavix for 6  Days total.for pt thyroid biopsy on 9/27. Please call and advise.

## 2016-06-27 NOTE — OR Nursing (Signed)
Pt called with pre procedure instructions inc,uding hold Plavix after today's dose, Dr Tamala Julian office didn't tell her to stop Plavix until this am (last dose was supposed to be 9/19 for sept 25 th procedure, so rescheduled to Sept 27 th) See note regarding Plavix hold approval from cardiologist Dr Fletcher Anon

## 2016-06-27 NOTE — OR Nursing (Signed)
Dr Fletcher Anon office to verify if ok to stop plavix prior to procedure(sept 27)  with last dose sept 21st. Will call back with instructions.

## 2016-06-27 NOTE — Telephone Encounter (Signed)
Plavix prescribed and monitored by [name], MD. Will route to her for review and advice.

## 2016-06-27 NOTE — Telephone Encounter (Signed)
Spoke with pt about plavix. Verbalized understanding. Had not received phone call about scheduling thyroid biopsy. According to chart this is scheduled 07/01/2016.

## 2016-06-28 ENCOUNTER — Telehealth: Payer: Self-pay

## 2016-06-28 NOTE — Telephone Encounter (Signed)
Patient is having a biopsy and Juanita from [facility] is calling for clearance for the patient to stay off of plavix off for 5  Days. Please advise!  [phone]

## 2016-06-29 NOTE — Telephone Encounter (Signed)
Patient is on Plavix, will you approve for her to d/c x5days for a biopsy ?

## 2016-07-01 ENCOUNTER — Ambulatory Visit: Admission: RE | Admit: 2016-07-01 | Payer: PPO | Source: Ambulatory Visit

## 2016-07-01 NOTE — Telephone Encounter (Signed)
Yes.  Please have patient hold Plavix for 5-6 days due to thyroid biopsy.

## 2016-07-01 NOTE — Telephone Encounter (Signed)
Left a VM on number provided for [facility]

## 2016-07-01 NOTE — Telephone Encounter (Signed)
Yes.  Fine for patient to hold Plavix six days total.

## 2016-07-02 NOTE — Telephone Encounter (Signed)
Notes have been sent

## 2016-07-02 NOTE — Telephone Encounter (Signed)
Juanita from [place] is calling because because they needs a copy of the most recent office notes faxed. Fax: [phone] Attn: [place]

## 2016-07-03 ENCOUNTER — Ambulatory Visit
Admission: RE | Admit: 2016-07-03 | Discharge: 2016-07-03 | Disposition: A | Discharge status: Home / Self Care | Payer: PPO | Source: Ambulatory Visit | Attending: Family Medicine | Admitting: Family Medicine

## 2016-07-03 DIAGNOSIS — E041 Nontoxic single thyroid nodule: Secondary | ICD-10-CM

## 2016-07-03 NOTE — Procedures (Signed)
Under US guidance, FNA of isthmus nodule was performed. No immediate complication.

## 2016-07-03 NOTE — Procedures (Signed)
Discussed procedure and risks with patient. Informed consent was obtained. Reviewed H+P of 05/27/16 by Dr. Fletcher Anon and noted no changes. Will perform US-guided thyroid biopsy.

## 2016-07-09 ENCOUNTER — Other Ambulatory Visit: Payer: Self-pay | Admitting: Family Medicine

## 2016-07-09 DIAGNOSIS — L03031 Cellulitis of right toe: Secondary | ICD-10-CM | POA: Diagnosis not present

## 2016-07-09 DIAGNOSIS — L97509 Non-pressure chronic ulcer of other part of unspecified foot with unspecified severity: Secondary | ICD-10-CM | POA: Diagnosis not present

## 2016-07-09 DIAGNOSIS — Z794 Long term (current) use of insulin: Secondary | ICD-10-CM | POA: Diagnosis not present

## 2016-07-09 DIAGNOSIS — E11621 Type 2 diabetes mellitus with foot ulcer: Secondary | ICD-10-CM | POA: Diagnosis not present

## 2016-07-09 DIAGNOSIS — L97511 Non-pressure chronic ulcer of other part of right foot limited to breakdown of skin: Secondary | ICD-10-CM | POA: Diagnosis not present

## 2016-07-09 NOTE — Telephone Encounter (Signed)
04/16/2016 last exam and labs

## 2016-07-16 ENCOUNTER — Other Ambulatory Visit: Payer: Self-pay | Admitting: Family Medicine

## 2016-07-23 ENCOUNTER — Encounter: Payer: Self-pay | Admitting: Internal Medicine

## 2016-07-23 ENCOUNTER — Ambulatory Visit (INDEPENDENT_AMBULATORY_CARE_PROVIDER_SITE_OTHER): Payer: PPO | Admitting: Internal Medicine

## 2016-07-23 VITALS — BP 120/60 | HR 89 | Ht 68.0 in | Wt 281.0 lb

## 2016-07-23 DIAGNOSIS — I5022 Chronic systolic (congestive) heart failure: Secondary | ICD-10-CM | POA: Diagnosis not present

## 2016-07-23 DIAGNOSIS — I255 Ischemic cardiomyopathy: Secondary | ICD-10-CM

## 2016-07-23 DIAGNOSIS — Z95810 Presence of automatic (implantable) cardiac defibrillator: Secondary | ICD-10-CM

## 2016-07-23 LAB — CUP PACEART INCLINIC DEVICE CHECK
Brady Statistic AP VS Percent: 0 %
Brady Statistic AS VS Percent: 0.27 %
Brady Statistic RA Percent Paced: 8.86 %
HighPow Impedance: 67 Ohm
Implantable Lead Implant Date: 20150930
Implantable Lead Implant Date: 20150930
Implantable Lead Location: 753857
Implantable Lead Location: 753860
Implantable Lead Model: 5076
Lead Channel Impedance Value: 323 Ohm
Lead Channel Impedance Value: 380 Ohm
Lead Channel Impedance Value: 456 Ohm
Lead Channel Impedance Value: 532 Ohm
Lead Channel Impedance Value: 589 Ohm
Lead Channel Impedance Value: 589 Ohm
Lead Channel Pacing Threshold Amplitude: 0.5 V
Lead Channel Pacing Threshold Amplitude: 1 V
Lead Channel Pacing Threshold Pulse Width: 0.4 ms
Lead Channel Pacing Threshold Pulse Width: 0.4 ms
Lead Channel Sensing Intrinsic Amplitude: 1.375 mV
Lead Channel Setting Pacing Amplitude: 1.5 V
Lead Channel Setting Pacing Pulse Width: 0.4 ms
Lead Channel Setting Sensing Sensitivity: 0.3 mV
MDC IDC LEAD IMPLANT DT: 20150930
MDC IDC LEAD LOCATION: 753859
MDC IDC LEAD MODEL: 4398
MDC IDC MSMT BATTERY REMAINING LONGEVITY: 79 mo
MDC IDC MSMT BATTERY VOLTAGE: 2.98 V
MDC IDC MSMT LEADCHNL LV IMPEDANCE VALUE: 342 Ohm
MDC IDC MSMT LEADCHNL LV IMPEDANCE VALUE: 342 Ohm
MDC IDC MSMT LEADCHNL LV IMPEDANCE VALUE: 342 Ohm
MDC IDC MSMT LEADCHNL LV IMPEDANCE VALUE: 437 Ohm
MDC IDC MSMT LEADCHNL LV IMPEDANCE VALUE: 532 Ohm
MDC IDC MSMT LEADCHNL LV IMPEDANCE VALUE: 589 Ohm
MDC IDC MSMT LEADCHNL RA IMPEDANCE VALUE: 399 Ohm
MDC IDC MSMT LEADCHNL RV PACING THRESHOLD AMPLITUDE: 0.75 V
MDC IDC MSMT LEADCHNL RV PACING THRESHOLD PULSEWIDTH: 0.4 ms
MDC IDC MSMT LEADCHNL RV SENSING INTR AMPL: 29 mV
MDC IDC SESS DTM: 20171017154759
MDC IDC SET LEADCHNL LV PACING AMPLITUDE: 2 V
MDC IDC SET LEADCHNL LV PACING PULSEWIDTH: 0.4 ms
MDC IDC SET LEADCHNL RV PACING AMPLITUDE: 2 V
MDC IDC STAT BRADY AP VP PERCENT: 8.86 %
MDC IDC STAT BRADY AS VP PERCENT: 90.86 %
MDC IDC STAT BRADY RV PERCENT PACED: 99.41 %

## 2016-07-23 NOTE — Progress Notes (Signed)
Review    Electrophysiology Office Note   Date:  07/23/2016   ID:  [name], DOB 1956-08-14, MRN [id number]  PCP:  [name], MD  Cardiologist:  CHF Primary Electrophysiologist:    Virl Axe, MD    Chief Complaint  Patient presents with  . Cardiomyopathy  . Chronic systolic heart failure ([place])  . ICD (implantable cardioverter-defibrillator), biventricular,     History of Present Illness: [name] is a 60 y.o. female  seen today for follow-up of CRT-D implantation undertaken 9/15. 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 There is marked interval improvement in LV function   She was seen a couple of weeks ago in [place]. Her optivol is markedly increased and her volume status was overloaded with worsening dyspnea edema and orthopnea.   She has been doing very well with little volume overload dyspnea is at baseline.   She has morbid obesity. She doesn't smoke.  She has OSA But has not been using her CPAP   7/17 CR 1.53 K5.0 TSH 2.81   DATE TEST    9/15    echo   EF 30 %   1 /17 echo   EF 50 %    Above records were reviewed personally and with the patient  Past Medical History:  Diagnosis Date  . Acute myocardial infarction, subendocardial infarction, initial episode of care ([place]) 07/21/2012  . Acute osteomyelitis involving ankle and foot ([place]) 02/06/2015  . Acute systolic heart failure ([place]) 07/21/2012   New onset 07/19/12; admission to [facility] ED. Elevated Troponins.  S/p 2D-echo with EF 20-25%.  S/p cardiac catheterization with stenting LAD.  Repeat 2D-echo 10/2011 with improved EF of 35%.   . Anemia   . Automatic implantable cardioverter-defibrillator in situ    a. MDT CRT-D 06/2014, SN: [id number]    . CAD (coronary artery disease)    a. cardiac cath 101/04/2012: PCI/DES to chronically occluded mLAD, consideration PCI to diag branch in 4 weeks.   . Chicken pox   . Chronic systolic  CHF (congestive heart failure) (HCC)    a. mixed ICM & NICM; b. EF 20-25% by echo 07/2012, mid-dist 2/3 of LV sev HK/AK, mild MR. echo 10/2012: EF 30-35%, sev HK ant-septal & inf walls, GR1DD, mild MR, PASP 33. c. echo 02/2013: EF 30%, GR1DD, mild MR. echo 04/2014: EF 30%, Septal-lat dyssynchrony, global HK, inf AK, GR1DD, mild MR. d. echo 10/2014: EF50-55%, WM nl, GR1DD, septal mild paradox. e. echo 02/2015: EF 50-55%, wm   . Heart attack   . Heel ulcer ([place]) 04/27/2015  . History of blood transfusion ~ 2011   "plasma; had neuropathy; couldn't walk"  . Hypertension   . LBBB (left bundle branch block)   . Neuropathy ([place]) 2011  . Obesity, unspecified   . OSA on CPAP    Moderate with AHI 23/hr and now on CPAP at 16cm H2O.  Her DME is [facility]  . Pure hypercholesterolemia   . Type II diabetes mellitus ([place])   . Unspecified vitamin D deficiency    Past Surgical History:  Procedure Laterality Date  . AMPUTATION TOE Right 06/18/2015   Procedure: AMPUTATION TOE;  Surgeon: Samara Deist, DPM;  Location: [facility] ORS;  Service: Podiatry;  Laterality: Right;  . BACK SURGERY    . BI-VENTRICULAR IMPLANTABLE CARDIOVERTER DEFIBRILLATOR N/A 07/06/2014   Procedure: BI-VENTRICULAR IMPLANTABLE CARDIOVERTER DEFIBRILLATOR  (CRT-D);  Surgeon: Deboraha Sprang, MD;  Location: [facility] CATH LAB;  Service: Cardiovascular;  Laterality: N/A;  . BI-VENTRICULAR IMPLANTABLE CARDIOVERTER DEFIBRILLATOR  (CRT-D)  07/06/2014  . BILATERAL OOPHORECTOMY  01/2011   ovarian cyst benign  . COLONOSCOPY WITH PROPOFOL Left 02/22/2015   Procedure: COLONOSCOPY WITH PROPOFOL;  Surgeon: Hulen Luster, MD;  Location: [facility] ENDOSCOPY;  Service: Endoscopy;  Laterality: Left;  . CORONARY ANGIOPLASTY WITH STENT PLACEMENT Left 07/2012   new onset systolic CHF; elevated troponins.  Cardiac catheterization with stenting to LAD; EF 15%.  2D-echo: EF 20-25%.  . ESOPHAGOGASTRODUODENOSCOPY N/A 02/22/2015   Procedure: ESOPHAGOGASTRODUODENOSCOPY (EGD);  Surgeon: Hulen Luster,  MD;  Location: [facility] ENDOSCOPY;  Service: Endoscopy;  Laterality: N/A;  . INCISION AND DRAINAGE ABSCESS Right 2007   groin; with ICU stay due to sepsis.  Marland Kitchen LAPAROSCOPIC CHOLECYSTECTOMY  2011  . LEFT HEART CATHETERIZATION WITH CORONARY ANGIOGRAM N/A 07/21/2012   Procedure: LEFT HEART CATHETERIZATION WITH CORONARY ANGIOGRAM;  Surgeon: Jolaine Artist, MD;  Location: [facility] CATH LAB;  Service: Cardiovascular;  Laterality: N/A;  . [place]   L4-5  . PERCUTANEOUS CORONARY STENT INTERVENTION (PCI-S) N/A 07/23/2012   Procedure: PERCUTANEOUS CORONARY STENT INTERVENTION (PCI-S);  Surgeon: Sherren Mocha, MD;  Location: [facility] CATH LAB;  Service: Cardiovascular;  Laterality: N/A;  . PERIPHERAL VASCULAR CATHETERIZATION N/A 02/10/2015   Procedure: Picc Line Insertion;  Surgeon: Katha Cabal, MD;  Location: [facility] CV LAB;  Service: Cardiovascular;  Laterality: N/A;  . TUBAL LIGATION  1981  . VAGINAL HYSTERECTOMY  01/2011   Fibroids/DUB.  Ovaries removed. [place].     Current Outpatient Prescriptions  Medication Sig Dispense Refill  . AMBULATORY NON FORMULARY MEDICATION 1 Units by Other route once. Medication Name: foot brace 1 Units 0  . Calcium Carb-Cholecalciferol (CALCIUM 600 + D PO) Take 1 tablet by mouth 2 (two) times daily.    . carvedilol (COREG) 3.125 MG tablet Take 1 tablet (3.125 mg total) by mouth 2 (two) times daily. 60 tablet 3  . citalopram (CELEXA) 10 MG tablet TAKE 1 TABLET BY MOUTH ONCE A DAY 90 tablet 1  . clopidogrel (PLAVIX) 75 MG tablet Take 1 tablet (75 mg total) by mouth daily. 90 tablet 3  . gabapentin (NEURONTIN) 600 MG tablet TAKE 1 TABLET BY MOUTH 3 TIMES DAILY 270 tablet 0  . glimepiride (AMARYL) 4 MG tablet TAKE 1 TABLET BY MOUTH 2 TIMES DAILY BEFORE A MEAL 180 tablet 0  . insulin aspart (NOVOLOG) 100 UNIT/ML FlexPen Inject 5 Units into the skin 3 (three) times daily with meals. 15 mL 11  . Insulin Glargine (LANTUS) 100 UNIT/ML Solostar Pen Inject 15  Units into the skin at bedtime. 15 mL 11  . isosorbide mononitrate (IMDUR) 30 MG 24 hr tablet Take 1 tablet (30 mg total) by mouth daily. 30 tablet 6  . MULTIPLE VITAMIN PO Take 1 tablet by mouth every morning.     . mupirocin ointment (BACTROBAN) 2 % Apply to affected area 3 times daily 22 g 0  . nitroGLYCERIN (NITROSTAT) 0.4 MG SL tablet Place under the tongue. Reported on 04/16/2016    . Omega-3 Fatty Acids (FISH OIL) 1000 MG CAPS Take 1,000 mg by mouth 2 (two) times daily.     . ONE TOUCH ULTRA TEST test strip USE TO CHECK BLOOD SUGAR 3 TIMES A DAY 100 each 0  . OVER THE COUNTER MEDICATION     . pravastatin (PRAVACHOL) 40 MG tablet Take 1 tablet (40 mg total) by mouth at bedtime. 90 tablet 3  .  torsemide (DEMADEX) 20 MG tablet Take 1 tablet (20 mg total) by mouth daily. 30 tablet 11  . traMADol (ULTRAM) 50 MG tablet TAKE 1 TABLET EVERY 6 HOURS AS NEEDED FOR PAIN 120 tablet 5   No current facility-administered medications for this visit.     Allergies:   Review of patient's allergies indicates no known allergies.   Social History:  The patient  reports that she has never smoked. She has never used smokeless tobacco. She reports that she does not drink alcohol or use drugs.   Family History:  The patient's family history includes Arthritis in her mother; Cancer in her father; Diabetes in her maternal grandmother, mother, paternal grandmother, and son; Hypertension in her mother and son; Obesity in her brother.    ROS:  Please see the history of present illness.   .   All other systems are reviewed and negative.    PHYSICAL EXAM: VS:  BP 120/60   Pulse 89   Ht 5\' 8"  (1.727 m)   Wt 281 lb (127.5 kg)   SpO2 93%   BMI 42.73 kg/m  , BMI Body mass index is 42.73 kg/m. GEN: Well nourished, well developed, in no acute distress  HEENT: normal  Neck: JVP 8 carotid bruits, or masses Cardiac: REGULAR RATE and RHYTHM ; 2/6  murmurs, rubs, + S4  Back without kyphosis or CVAT Respiratory:   clear to auscultation bilaterally, normal work of breathing GI: soft, nontender, nondistended, + BS MS: no deformity or atrophy  Extremities no clubbing cyanosis 1+ edema Skin: warm and dry,  device pocket is well healed Neuro:  Strength and sensation are intact Psych: euthymic mood, full affect  EKG:  EKG is ordered today. The ekg ordered today shows P-synchronous/ AV  pacing QRS morphology RS in V1 and QR that B whether he could to he should be able to disease or having this is this is an a sensed as we should really go down back up Shouldn't in lead 1   Device interrogation is reviewed today in detail.  See PaceArt for details.   Recent Labs: 04/16/2016: ALT 13; BUN 32; Creat 1.53; Hemoglobin 12.4; Platelets 203; Potassium 5.0; Sodium 142; TSH 2.81    Lipid Panel     Component Value Date/Time   CHOL 155 04/16/2016 1650   TRIG 213 (H) 04/16/2016 1650   HDL 40 (L) 04/16/2016 1650   CHOLHDL 3.9 04/16/2016 1650   VLDL 43 (H) 04/16/2016 1650   LDLCALC 72 04/16/2016 1650     Wt Readings from Last 3 Encounters:  07/23/16 281 lb (127.5 kg)  07/03/16 274 lb (124.3 kg)  05/27/16 275 lb 4 oz (124.9 kg)      Other studies Reviewed:    ASSESSMENT AND PLAN: Ischemic cardiomyopathy  CRT-D  CHF chronic systolic  Abnormal QRS vector  Renal Insufficiency    Hyperkalemia  Potassium precludes the use of Ace inhibitors or Aldactone. Thankfully her LV function is improved and the need for these are less. Blood pressure is reasonable at this juncture; carvedilol doses could be up titrated.  I've encouraged her to follow-up with her device company to see if something can be done to allow the use of CPAP.   Current medicines are reviewed at length with the patient today.   The patient does not have concerns regarding her medicines.  The following changes were made today:  none  Labs/ tests ordered today include: eco cxr  Orders Placed This Encounter  Procedures  .  EKG 12-Lead      Disposition:   FU with 12 months  Signed, Virl Axe, MD  07/23/2016 12:01 PM     [address] [place] [address] [phone] (office) [phone] (fax)

## 2016-07-23 NOTE — Patient Instructions (Addendum)
Medication Instructions: - Your physician recommends that you continue on your current medications as directed. Please refer to the Current Medication list given to you today.  Labwork: - none ordered  Procedures/Testing: - none ordered  Follow-Up: - Remote monitoring is used to monitor your Pacemaker of ICD from home. This monitoring reduces the number of office visits required to check your device to one time per year. It allows us to keep an eye on the functioning of your device to ensure it is working properly. You are scheduled for a device check from home on 08/12/17. You may send your transmission at any time that day. If you have a wireless device, the transmission will be sent automatically. After your physician reviews your transmission, you will receive a postcard with your next transmission date.  - Your physician wants you to follow-up in: 1 year with Dr. Klein. You will receive a reminder letter in the mail two months in advance. If you don't receive a letter, please call our office to schedule the follow-up appointment.   Any Additional Special Instructions Will Be Listed Below (If Applicable).     If you need a refill on your cardiac medications before your next appointment, please call your pharmacy.   

## 2016-07-24 DIAGNOSIS — D440 Neoplasm of uncertain behavior of thyroid gland: Secondary | ICD-10-CM | POA: Diagnosis not present

## 2016-08-01 LAB — CYTOLOGY - NON PAP

## 2016-08-05 DIAGNOSIS — N183 Chronic kidney disease, stage 3 (moderate): Secondary | ICD-10-CM | POA: Diagnosis not present

## 2016-08-05 DIAGNOSIS — E875 Hyperkalemia: Secondary | ICD-10-CM | POA: Diagnosis not present

## 2016-08-05 DIAGNOSIS — R809 Proteinuria, unspecified: Secondary | ICD-10-CM | POA: Diagnosis not present

## 2016-08-05 DIAGNOSIS — E1122 Type 2 diabetes mellitus with diabetic chronic kidney disease: Secondary | ICD-10-CM | POA: Diagnosis not present

## 2016-08-05 DIAGNOSIS — I129 Hypertensive chronic kidney disease with stage 1 through stage 4 chronic kidney disease, or unspecified chronic kidney disease: Secondary | ICD-10-CM | POA: Diagnosis not present

## 2016-08-06 ENCOUNTER — Ambulatory Visit (INDEPENDENT_AMBULATORY_CARE_PROVIDER_SITE_OTHER): Payer: PPO | Admitting: Family Medicine

## 2016-08-06 ENCOUNTER — Encounter: Payer: Self-pay | Admitting: Family Medicine

## 2016-08-06 VITALS — BP 120/62 | HR 63 | Temp 98.1°F | Resp 18 | Ht 68.0 in | Wt 278.4 lb

## 2016-08-06 DIAGNOSIS — N183 Chronic kidney disease, stage 3 unspecified: Secondary | ICD-10-CM

## 2016-08-06 DIAGNOSIS — I2510 Atherosclerotic heart disease of native coronary artery without angina pectoris: Secondary | ICD-10-CM

## 2016-08-06 DIAGNOSIS — Z23 Encounter for immunization: Secondary | ICD-10-CM

## 2016-08-06 DIAGNOSIS — D631 Anemia in chronic kidney disease: Secondary | ICD-10-CM

## 2016-08-06 DIAGNOSIS — Z8673 Personal history of transient ischemic attack (TIA), and cerebral infarction without residual deficits: Secondary | ICD-10-CM | POA: Diagnosis not present

## 2016-08-06 DIAGNOSIS — E7800 Pure hypercholesterolemia, unspecified: Secondary | ICD-10-CM

## 2016-08-06 DIAGNOSIS — I10 Essential (primary) hypertension: Secondary | ICD-10-CM

## 2016-08-06 DIAGNOSIS — E1142 Type 2 diabetes mellitus with diabetic polyneuropathy: Secondary | ICD-10-CM | POA: Diagnosis not present

## 2016-08-06 DIAGNOSIS — G4733 Obstructive sleep apnea (adult) (pediatric): Secondary | ICD-10-CM

## 2016-08-06 LAB — POCT URINALYSIS DIP (MANUAL ENTRY)
Glucose, UA: NEGATIVE
LEUKOCYTES UA: NEGATIVE
Nitrite, UA: NEGATIVE
PH UA: 5
SPEC GRAV UA: 1.015
Urobilinogen, UA: 0.2

## 2016-08-06 LAB — CBC WITH DIFFERENTIAL/PLATELET
BASOS ABS: 0 {cells}/uL (ref 0–200)
Basophils Relative: 0 %
EOS PCT: 2 %
Eosinophils Absolute: 160 cells/uL (ref 15–500)
HCT: 38.1 % (ref 35.0–45.0)
Hemoglobin: 12.7 g/dL (ref 11.7–15.5)
Lymphocytes Relative: 30 %
Lymphs Abs: 2400 cells/uL (ref 850–3900)
MCH: 30 pg (ref 27.0–33.0)
MCHC: 33.3 g/dL (ref 32.0–36.0)
MCV: 89.9 fL (ref 80.0–100.0)
MONOS PCT: 7 %
MPV: 10.1 fL (ref 7.5–12.5)
Monocytes Absolute: 560 cells/uL (ref 200–950)
NEUTROS PCT: 61 %
Neutro Abs: 4880 cells/uL (ref 1500–7800)
PLATELETS: 205 10*3/uL (ref 140–400)
RBC: 4.24 MIL/uL (ref 3.80–5.10)
RDW: 13.5 % (ref 11.0–15.0)
WBC: 8 10*3/uL (ref 3.8–10.8)

## 2016-08-06 LAB — COMPREHENSIVE METABOLIC PANEL
ALBUMIN: 4 g/dL (ref 3.6–5.1)
ALT: 10 U/L (ref 6–29)
AST: 15 U/L (ref 10–35)
Alkaline Phosphatase: 107 U/L (ref 33–130)
BUN: 33 mg/dL — AB (ref 7–25)
CHLORIDE: 100 mmol/L (ref 98–110)
CO2: 29 mmol/L (ref 20–31)
CREATININE: 1.51 mg/dL — AB (ref 0.50–0.99)
Calcium: 9.1 mg/dL (ref 8.6–10.4)
Glucose, Bld: 80 mg/dL (ref 65–99)
POTASSIUM: 4.8 mmol/L (ref 3.5–5.3)
SODIUM: 138 mmol/L (ref 135–146)
Total Bilirubin: 0.5 mg/dL (ref 0.2–1.2)
Total Protein: 7.1 g/dL (ref 6.1–8.1)

## 2016-08-06 LAB — LIPID PANEL
CHOL/HDL RATIO: 5.6 ratio — AB (ref <=5.0)
Cholesterol: 186 mg/dL (ref 125–200)
HDL: 33 mg/dL — ABNORMAL LOW (ref >=46)
LDL CALC: 119 mg/dL (ref <130)
TRIGLYCERIDES: 170 mg/dL — AB (ref <150)
VLDL: 34 mg/dL — AB (ref <30)

## 2016-08-06 LAB — HEMOGLOBIN A1C
Hgb A1c MFr Bld: 7.1 % — ABNORMAL HIGH (ref <5.7)
Mean Plasma Glucose: 157 mg/dL

## 2016-08-06 MED ORDER — CITALOPRAM HYDROBROMIDE 20 MG PO TABS: 20.0000 mg | ORAL_TABLET | Freq: Every day | ORAL | 1 refills | Status: DC

## 2016-08-06 MED ORDER — INSULIN GLARGINE 100 UNIT/ML SOLOSTAR PEN: 18.0000 [IU] | PEN_INJECTOR | Freq: Every day | SUBCUTANEOUS | 11 refills | Status: DC

## 2016-08-06 NOTE — Patient Instructions (Addendum)
1. Increase Amaryl to twice daily with a meal.    IF you received an x-ray today, you will receive an invoice from [facility]. Please contact [facility] at [phone] with questions or concerns regarding your invoice.   IF you received labwork today, you will receive an invoice from [employer]. Please contact [facility] at [phone] with questions or concerns regarding your invoice.   Our billing staff will not be able to assist you with questions regarding bills from these companies.  You will be contacted with the lab results as soon as they are available. The fastest way to get your results is to activate your My Chart account. Instructions are located on the last page of this paperwork. If you have not heard from [place] regarding the results in 2 weeks, please contact this office.

## 2016-08-06 NOTE — Progress Notes (Signed)
Subjective:    Patient ID: [name], female    DOB: 1955/11/25, 60 y.o.   MRN: [id number]  08/06/2016  Follow-up (for blood sugar)   HPI This 60 y.o. female presents for three month follow-up of DMII with peripheral neuropathy, hyperlipidemia, Class III CHF with CAD s/p stenting in 2013, chronic kidney disease.  Has a 58 old grandson; other grandson is 63 yo.  Sugars are terrible.  Has blood sugar diary.  Eating oatblenders; sugar was doing great on it.  Now all of sudden, sugar is going up.  Not changing any other aspect of diet.  Eating a lot of honey bunches of notes with strawberries. Dr. Caryl Comes released for one year.  Fasting sugars 98-216.  Evening sugar ranging 125-436.   Weight stable at 274.  Usually checks before lunch; always checks at bedtime. Checks sugar three days per week at lunch.  higher at bedtime.    Biggest challenge is sweets. Called to have Amaryl restarted because sugars started going high; taking Amaryl 31m every morning. Using Novolog 10 units at lunch only; then takes Lantus 15 at night.  Got a low yesterday morning; did not eat; took Amaryl yesterday morning.  Eye exam in December 2017.   S/p thyroid biopsy on 07/03/16; negative bx; needs repeat imaging in 3-4 months.   Wt Readings from Last 3 Encounters:  08/23/16 286 lb 8 oz (130 kg)  08/13/16 288 lb (130.6 kg)  08/06/16 278 lb 6.4 oz (126.3 kg)   BP Readings from Last 3 Encounters:  08/23/16 110/68  08/13/16 130/70  08/06/16 120/62    Review of Systems  Constitutional: Negative for chills, diaphoresis, fatigue and fever.  Eyes: Negative for visual disturbance.  Respiratory: Negative for cough and shortness of breath.   Cardiovascular: Negative for chest pain, palpitations and leg swelling.  Gastrointestinal: Negative for abdominal pain, constipation, diarrhea, nausea and vomiting.  Endocrine: Negative for cold intolerance, heat intolerance, polydipsia, polyphagia and polyuria.    Neurological: Positive for weakness and numbness. Negative for dizziness, tremors, seizures, syncope, facial asymmetry, speech difficulty, light-headedness and headaches.  Psychiatric/Behavioral: Positive for dysphoric mood. The patient is nervous/anxious.     Past Medical History:  Diagnosis Date  . Acute myocardial infarction, subendocardial infarction, initial episode of care ([facility] 07/21/2012  . Acute osteomyelitis involving ankle and foot ([facility] 02/06/2015  . Acute systolic heart failure ([facility] 07/21/2012   [facility] Elevated Troponins.  S/p 2D-echo with EF 20-25%.  S/p cardiac catheterization with stenting LAD.  Repeat 2D-echo 10/2011 with improved EF of 35%.   . Anemia   . Automatic implantable cardioverter-defibrillator in situ    a. MDT CRT-D 06/2014, SN:: [id number]    . CAD (coronary artery disease)    a. cardiac cath 101/04/2012: PCI/DES to chronically occluded mLAD, consideration PCI to diag branch in 4 weeks.   . Cataract   . Chicken pox   . Chronic systolic CHF (congestive heart failure) (HCC)    a. mixed ICM & NICM; b. EF 20-25% by echo 07/2012, mid-dist 2/3 of LV sev HK/AK, mild MR. echo 10/2012: EF 30-35%, sev HK ant-septal & inf walls, GR1DD, mild MR, PASP 33. c. echo 02/2013: EF 30%, GR1DD, mild MR. echo 04/2014: EF 30%, Septal-lat dyssynchrony, global HK, inf AK, GR1DD, mild MR. d. echo 10/2014: EF50-55%, WM nl, GR1DD, septal mild paradox. e. echo 02/2015: EF 50-55%, wm   . Depression   . Heart attack   .  Heel ulcer ([place]) 04/27/2015  . History of blood transfusion ~ 2011   "plasma; had neuropathy; couldn't walk"  . Hypertension   . LBBB (left bundle branch block)   . Neuropathy ([place]) 2011  . Obesity, unspecified   . OSA on CPAP    Moderate with AHI 23/hr and now on CPAP at 16cm H2O.  Her DME is [facility]  . Pure hypercholesterolemia   . Type II diabetes mellitus ([place])   . Unspecified vitamin D deficiency    Past Surgical History:  Procedure  Laterality Date  . AMPUTATION TOE Right 06/18/2015   Procedure: AMPUTATION TOE;  Surgeon: Samara Deist, DPM;  Location: [facility] ORS;  Service: Podiatry;  Laterality: Right;  . BACK SURGERY    . BI-VENTRICULAR IMPLANTABLE CARDIOVERTER DEFIBRILLATOR N/A 07/06/2014   Procedure: BI-VENTRICULAR IMPLANTABLE CARDIOVERTER DEFIBRILLATOR  (CRT-D);  Surgeon: Deboraha Sprang, MD;  Location: [facility] CATH LAB;  Service: Cardiovascular;  Laterality: N/A;  . BI-VENTRICULAR IMPLANTABLE CARDIOVERTER DEFIBRILLATOR  (CRT-D)  07/06/2014  . BILATERAL OOPHORECTOMY  01/2011   ovarian cyst benign  . COLONOSCOPY WITH PROPOFOL Left 02/22/2015   Procedure: COLONOSCOPY WITH PROPOFOL;  Surgeon: Hulen Luster, MD;  Location: [facility] ENDOSCOPY;  Service: Endoscopy;  Laterality: Left;  . CORONARY ANGIOPLASTY WITH STENT PLACEMENT Left 07/2012   new onset systolic CHF; elevated troponins.  Cardiac catheterization with stenting to LAD; EF 15%.  2D-echo: EF 20-25%.  . ESOPHAGOGASTRODUODENOSCOPY N/A 02/22/2015   Procedure: ESOPHAGOGASTRODUODENOSCOPY (EGD);  Surgeon: Hulen Luster, MD;  Location: [facility] ENDOSCOPY;  Service: Endoscopy;  Laterality: N/A;  . INCISION AND DRAINAGE ABSCESS Right 2007   groin; with ICU stay due to sepsis.  Marland Kitchen LAPAROSCOPIC CHOLECYSTECTOMY  2011  . LEFT HEART CATHETERIZATION WITH CORONARY ANGIOGRAM N/A 07/21/2012   Procedure: LEFT HEART CATHETERIZATION WITH CORONARY ANGIOGRAM;  Surgeon: Jolaine Artist, MD;  Location: [facility] CATH LAB;  Service: Cardiovascular;  Laterality: N/A;  . [place]   L4-5  . PERCUTANEOUS CORONARY STENT INTERVENTION (PCI-S) N/A 07/23/2012   Procedure: PERCUTANEOUS CORONARY STENT INTERVENTION (PCI-S);  Surgeon: Sherren Mocha, MD;  Location: [facility] CATH LAB;  Service: Cardiovascular;  Laterality: N/A;  . PERIPHERAL VASCULAR CATHETERIZATION N/A 02/10/2015   Procedure: Picc Line Insertion;  Surgeon: Katha Cabal, MD;  Location: [facility] CV LAB;  Service: Cardiovascular;  Laterality: N/A;    . TUBAL LIGATION  1981  . VAGINAL HYSTERECTOMY  01/2011   Fibroids/DUB.  Ovaries removed. [place].   No Known Allergies Current Outpatient Prescriptions  Medication Sig Dispense Refill  . AMBULATORY NON FORMULARY MEDICATION 1 Units by Other route once. Medication Name: foot brace 1 Units 0  . Calcium Carb-Cholecalciferol (CALCIUM 600 + D PO) Take 1 tablet by mouth 2 (two) times daily.    . citalopram (CELEXA) 20 MG tablet Take 1 tablet (20 mg total) by mouth daily. 90 tablet 1  . clopidogrel (PLAVIX) 75 MG tablet Take 1 tablet (75 mg total) by mouth daily. 90 tablet 3  . glimepiride (AMARYL) 4 MG tablet TAKE 1 TABLET BY MOUTH 2 TIMES DAILY BEFORE A MEAL 180 tablet 0  . insulin aspart (NOVOLOG) 100 UNIT/ML FlexPen Inject 5 Units into the skin 3 (three) times daily with meals. 15 mL 11  . Insulin Glargine (LANTUS) 100 UNIT/ML Solostar Pen Inject 18 Units into the skin at bedtime. 15 mL 11  . isosorbide mononitrate (IMDUR) 30 MG 24 hr tablet Take 1 tablet (30 mg total) by mouth daily. 30 tablet 6  . Omega-3  Fatty Acids (FISH OIL) 1000 MG CAPS Take 1,000 mg by mouth 2 (two) times daily.     Marland Kitchen OVER THE COUNTER MEDICATION     . pravastatin (PRAVACHOL) 40 MG tablet Take 1 tablet (40 mg total) by mouth at bedtime. 90 tablet 3  . torsemide (DEMADEX) 20 MG tablet Take 1 tablet (20 mg total) by mouth daily. 30 tablet 11  . traMADol (ULTRAM) 50 MG tablet TAKE 1 TABLET EVERY 6 HOURS AS NEEDED FOR PAIN 120 tablet 5  . carvedilol (COREG) 3.125 MG tablet Take 1 tablet (3.125 mg total) by mouth 2 (two) times daily. 60 tablet 3  . gabapentin (NEURONTIN) 600 MG tablet Take 1 tablet (600 mg total) by mouth 3 (three) times daily. 270 tablet 3  . MULTIPLE VITAMIN PO Take 1 tablet by mouth every morning.     . mupirocin ointment (BACTROBAN) 2 % Apply to affected area 3 times daily 22 g 0  . nitroGLYCERIN (NITROSTAT) 0.4 MG SL tablet Place under the tongue. Reported on 04/16/2016    . ONE TOUCH ULTRA TEST test  strip USE TO CHECK BLOOD SUGAR 3 TIMES A DAY 100 each 2   No current facility-administered medications for this visit.    Social History   Social History  . Marital status: Married    Spouse name: N/A  . Number of children: 2  . Years of education: 12   Occupational History  . disabled Unemployed    03/2010 for peripheral neuropathy  . home daycare     x 20 yrs.   Social History Main Topics  . Smoking status: Never Smoker  . Smokeless tobacco: Never Used  . Alcohol use No  . Drug use: No  . Sexual activity: No   Other Topics Concern  . Not on file   Social History Narrative   Marital status:Married x 44 yrs. Happily married, no abuse.       Children:  2 children (daughter 64, son 23). Two grandsons and 2 step grandchildren.       Lives: with husband, daughter, son-in-law, 2 grandsons.      Employment: disability for peripheral neuropathy 2012.  Previously had home daycare.      Tobacco: none      Alcohol: none      Drugs: none      Exercise: none. [employer].      Pets: dog.      Always uses seat belts, smoke detectors in home.      No guns in the home.       Caffeine use: 2 cups coffee per day.       Nutrition: Well balanced diet.   Family History  Problem Relation Age of Onset  . Diabetes Mother   . Hypertension Mother   . Arthritis Mother     knees, lumbar DDD, cervical DDD  . Cancer Father     prostate,skin,lymphoma.  . Obesity Brother   . Diabetes Maternal Grandmother   . Heart disease Maternal Grandmother   . Diabetes Paternal Grandmother   . Diabetes Son   . Hypertension Son   . Cancer Maternal Grandfather        Objective:    BP 120/62   Pulse 63   Temp 98.1 F (36.7 C) (Oral)   Resp 18   Ht 5' 8"  (1.727 m)   Wt 278 lb 6.4 oz (126.3 kg)   SpO2 98%   BMI 42.33 kg/m  Physical Exam  Constitutional:  She is oriented to person, place, and time. She appears well-developed and well-nourished. No distress.  HENT:  Head: Normocephalic  and atraumatic.  Right Ear: External ear normal.  Left Ear: External ear normal.  Nose: Nose normal.  Mouth/Throat: Oropharynx is clear and moist.  Eyes: Conjunctivae and EOM are normal. Pupils are equal, round, and reactive to light.  Neck: Normal range of motion. Neck supple. Carotid bruit is not present. No thyromegaly present.  Cardiovascular: Normal rate, regular rhythm, normal heart sounds and intact distal pulses.  Exam reveals no gallop and no friction rub.   No murmur heard. Pulmonary/Chest: Effort normal and breath sounds normal. She has no wheezes. She has no rales.  Abdominal: Soft. Bowel sounds are normal. She exhibits no distension and no mass. There is no tenderness. There is no rebound and no guarding.  Lymphadenopathy:    She has no cervical adenopathy.  Neurological: She is alert and oriented to person, place, and time. No cranial nerve deficit.  Skin: Skin is warm and dry. No rash noted. She is not diaphoretic. No erythema. No pallor.  Psychiatric: She has a normal mood and affect. Her behavior is normal.   Results for orders placed or performed in visit on 08/06/16  CBC with Differential/Platelet  Result Value Ref Range   WBC 8.0 3.8 - 10.8 K/uL   RBC 4.24 3.80 - 5.10 MIL/uL   Hemoglobin 12.7 11.7 - 15.5 g/dL   HCT 38.1 35.0 - 45.0 %   MCV 89.9 80.0 - 100.0 fL   MCH 30.0 27.0 - 33.0 pg   MCHC 33.3 32.0 - 36.0 g/dL   RDW 13.5 11.0 - 15.0 %   Platelets 205 140 - 400 K/uL   MPV 10.1 7.5 - 12.5 fL   Neutro Abs 4,880 1,500 - 7,800 cells/uL   Lymphs Abs 2,400 850 - 3,900 cells/uL   Monocytes Absolute 560 200 - 950 cells/uL   Eosinophils Absolute 160 15 - 500 cells/uL   Basophils Absolute 0 0 - 200 cells/uL   Neutrophils Relative % 61 %   Lymphocytes Relative 30 %   Monocytes Relative 7 %   Eosinophils Relative 2 %   Basophils Relative 0 %   Smear Review Criteria for review not met   Comprehensive metabolic panel  Result Value Ref Range   Sodium 138 135 - 146  mmol/L   Potassium 4.8 3.5 - 5.3 mmol/L   Chloride 100 98 - 110 mmol/L   CO2 29 20 - 31 mmol/L   Glucose, Bld 80 65 - 99 mg/dL   BUN 33 (H) 7 - 25 mg/dL   Creat 1.51 (H) 0.50 - 0.99 mg/dL   Total Bilirubin 0.5 0.2 - 1.2 mg/dL   Alkaline Phosphatase 107 33 - 130 U/L   AST 15 10 - 35 U/L   ALT 10 6 - 29 U/L   Total Protein 7.1 6.1 - 8.1 g/dL   Albumin 4.0 3.6 - 5.1 g/dL   Calcium 9.1 8.6 - 10.4 mg/dL  Lipid panel  Result Value Ref Range   Cholesterol 186 125 - 200 mg/dL   Triglycerides 170 (H) <150 mg/dL   HDL 33 (L) >=46 mg/dL   Total CHOL/HDL Ratio 5.6 (H) <=5.0 Ratio   VLDL 34 (H) <30 mg/dL   LDL Cholesterol 119 <130 mg/dL  Hemoglobin A1c  Result Value Ref Range   Hgb A1c MFr Bld 7.1 (H) <5.7 %   Mean Plasma Glucose 157 mg/dL  Microalbumin, urine  Result Value  Ref Range   Microalb, Ur 6.9 Not estab mg/dL  POCT urinalysis dipstick  Result Value Ref Range   Color, UA yellow yellow   Clarity, UA clear clear   Glucose, UA negative negative   Bilirubin, UA small (A) negative   Ketones, POC UA trace (5) (A) negative   Spec Grav, UA 1.015    Blood, UA trace-intact (A) negative   pH, UA 5.0    Protein Ur, POC trace (A) negative   Urobilinogen, UA 0.2    Nitrite, UA Negative Negative   Leukocytes, UA Negative Negative       Assessment & Plan:   1. Diabetic peripheral neuropathy associated with type 2 diabetes mellitus ([place])   2. Chronic kidney disease, stage III (moderate)   3. Anemia of chronic renal failure, stage 3 (moderate)   4. Pure hypercholesterolemia   5. CAD in native artery   6. Essential hypertension, benign   7. OSA (obstructive sleep apnea)   8. History of CVA (cerebrovascular accident)   9. Flu vaccine need    -moderately controlled; obtain labs; continue current medications except increase Lantus to 18 units daily. -increase Lantus to 18 units daily. -increase Citalopram to 5m daily. -will warrant repeat thyroid uKoreaat next visit.   Orders  Placed This Encounter  Procedures  . Flu Vaccine QUAD 36+ mos IM  . CBC with Differential/Platelet  . Comprehensive metabolic panel    Order Specific Question:   Has the patient fasted?    Answer:   Yes  . Lipid panel    Order Specific Question:   Has the patient fasted?    Answer:   Yes  . Hemoglobin A1c  . Microalbumin, urine  . POCT urinalysis dipstick   Meds ordered this encounter  Medications  . citalopram (CELEXA) 20 MG tablet    Sig: Take 1 tablet (20 mg total) by mouth daily.    Dispense:  90 tablet    Refill:  1  . Insulin Glargine (LANTUS) 100 UNIT/ML Solostar Pen    Sig: Inject 18 Units into the skin at bedtime.    Dispense:  15 mL    Refill:  11    Return in about 3 months (around 11/06/2016) for complete physical examiniation.   Kristi MElayne Guerin M.D. [name]  [phone] [phone]

## 2016-08-07 LAB — MICROALBUMIN, URINE: MICROALB UR: 6.9 mg/dL

## 2016-08-08 ENCOUNTER — Other Ambulatory Visit: Payer: Self-pay | Admitting: Family Medicine

## 2016-08-13 ENCOUNTER — Encounter: Payer: Self-pay | Admitting: Family Medicine

## 2016-08-13 ENCOUNTER — Ambulatory Visit (INDEPENDENT_AMBULATORY_CARE_PROVIDER_SITE_OTHER): Payer: PPO | Admitting: Family Medicine

## 2016-08-13 ENCOUNTER — Encounter: Payer: Self-pay | Admitting: Internal Medicine

## 2016-08-13 VITALS — BP 130/70 | HR 71 | Temp 98.0°F | Resp 17 | Ht 68.0 in | Wt 288.0 lb

## 2016-08-13 DIAGNOSIS — E1142 Type 2 diabetes mellitus with diabetic polyneuropathy: Secondary | ICD-10-CM

## 2016-08-13 DIAGNOSIS — IMO0001 Reserved for inherently not codable concepts without codable children: Secondary | ICD-10-CM

## 2016-08-13 DIAGNOSIS — Z23 Encounter for immunization: Secondary | ICD-10-CM | POA: Diagnosis not present

## 2016-08-13 DIAGNOSIS — Z6841 Body Mass Index (BMI) 40.0 and over, adult: Secondary | ICD-10-CM | POA: Diagnosis not present

## 2016-08-13 DIAGNOSIS — Z0000 Encounter for general adult medical examination without abnormal findings: Secondary | ICD-10-CM

## 2016-08-13 DIAGNOSIS — E6609 Other obesity due to excess calories: Secondary | ICD-10-CM

## 2016-08-13 MED ORDER — GABAPENTIN 600 MG PO TABS: 600.0000 mg | ORAL_TABLET | Freq: Three times a day (TID) | ORAL | 3 refills | Status: DC

## 2016-08-13 NOTE — Patient Instructions (Addendum)
   IF you received an x-ray today, you will receive an invoice from [facility]. Please contact [facility] at [phone] with questions or concerns regarding your invoice.   IF you received labwork today, you will receive an invoice from [facility]. Please contact [facility] at [phone] with questions or concerns regarding your invoice.   Our billing staff will not be able to assist you with questions regarding bills from these companies.  You will be contacted with the lab results as soon as they are available. The fastest way to get your results is to activate your My Chart account. Instructions are located on the last page of this paperwork. If you have not heard from us regarding the results in 2 weeks, please contact this office.    Keeping You Healthy  Get These Tests  Blood Pressure- Have your blood pressure checked by your healthcare provider at least once a year.  Normal blood pressure is 120/80.  Weight- Have your body mass index (BMI) calculated to screen for obesity.  BMI is a measure of body fat based on height and weight.  You can calculate your own BMI at www.nhlbisupport.com/bmi/  Cholesterol- Have your cholesterol checked every year.  Diabetes- Have your blood sugar checked every year if you have high blood pressure, high cholesterol, a family history of diabetes or if you are overweight.  Pap Test - Have a pap test every 1 to 5 years if you have been sexually active.  If you are older than 65 and recent pap tests have been normal you may not need additional pap tests.  In addition, if you have had a hysterectomy  for benign disease additional pap tests are not necessary.  Mammogram-Yearly mammograms are essential for early detection of breast cancer  Screening for Colon Cancer- Colonoscopy starting at age 50. Screening may begin sooner depending on your family history and other health conditions.  Follow up colonoscopy  as directed by your Gastroenterologist.  Screening for Osteoporosis- Screening begins at age 65 with bone density scanning, sooner if you are at higher risk for developing Osteoporosis.  Get these medicines  Calcium with Vitamin D- Your body requires 1200-1500 mg of Calcium a day and 800-1000 IU of Vitamin D a day.  You can only absorb 500 mg of Calcium at a time therefore Calcium must be taken in 2 or 3 separate doses throughout the day.  Hormones- Hormone therapy has been associated with increased risk for certain cancers and heart disease.  Talk to your healthcare provider about if you need relief from menopausal symptoms.  Aspirin- Ask your healthcare provider about taking Aspirin to prevent Heart Disease and Stroke.  Get these Immuniztions  Flu shot- Every fall  Pneumonia shot- Once after the age of 65; if you are younger ask your healthcare provider if you need a pneumonia shot.  Tetanus- Every ten years.  Zostavax- Once after the age of 60 to prevent shingles.  Take these steps  Don't smoke- Your healthcare provider can help you quit. For tips on how to quit, ask your healthcare provider or go to www.smokefree.gov or call 1-800 QUIT-NOW.  Be physically active- Exercise 5 days a week for a minimum of 30 minutes.  If you are not already physically active, start slow and gradually work up to 30 minutes of moderate physical activity.  Try walking, dancing, bike riding, swimming, etc.  Eat a healthy diet- Eat a variety of healthy foods such as fruits, vegetables, whole   grains, low fat milk, low fat cheeses, yogurt, lean meats, chicken, fish, eggs, dried beans, tofu, etc.  For more information go to www.thenutritionsource.org  Dental visit- Brush and floss teeth twice daily; visit your dentist twice a year.  Eye exam- Visit your Optometrist or Ophthalmologist yearly.  Drink alcohol in moderation- Limit alcohol intake to one drink or less a day.  Never drink and  drive.  Depression- Your emotional health is as important as your physical health.  If you're feeling down or losing interest in things you normally enjoy, please talk to your healthcare provider.  Seat Belts- can save your life; always wear one  Smoke/Carbon Monoxide detectors- These detectors need to be installed on the appropriate level of your home.  Replace batteries at least once a year.  Violence- If anyone is threatening or hurting you, please tell your healthcare provider.  Living Will/ Health care power of attorney- Discuss with your healthcare provider and family. 

## 2016-08-13 NOTE — Progress Notes (Signed)
Subjective:    Patient ID: [name], female    DOB: Apr 13, 1956, 60 y.o.   MRN: [id number]  08/13/2016  Annual Exam   HPI This 60 y.o. female presents for Annual Wellness Examination.  Last physical: 03-30-2014 Pap smear:  Hysterectomy; [facility]; fibroids.  Ovaries intact.  Mammogram: 03-08-2016 Colonoscopy:  02-2015 Eye exam: scheduled next month; has cataracts; +glasses. Dental exam:  Refuses/never/too expensive.   Immunization History  Administered Date(s) Administered  . Hepatitis B 03/16/2013  . Hepatitis B, adult 03/30/2014, 07/18/2014  . Influenza Split 07/21/2012  . Influenza,inj,Quad PF,36+ Mos 06/16/2013, 07/07/2014, 06/18/2015, 08/06/2016  . Pneumococcal Polysaccharide-23 08/08/2011, 06/18/2015, 08/13/2016  . Tdap 03/07/2010, 05/03/2016  . Zoster 04/06/2014   Wt Readings from Last 3 Encounters:  08/23/16 286 lb 8 oz (130 kg)  08/13/16 288 lb (130.6 kg)  08/06/16 278 lb 6.4 oz (126.3 kg)   BP Readings from Last 3 Encounters:  08/23/16 110/68  08/13/16 130/70  08/06/16 120/62   Not wearing CPAP machine due to wound on nose.     Review of Systems  Constitutional: Negative for activity change, appetite change, chills, diaphoresis, fatigue, fever and unexpected weight change.  HENT: Negative for congestion, dental problem, drooling, ear discharge, ear pain, facial swelling, hearing loss, mouth sores, nosebleeds, postnasal drip, rhinorrhea, sinus pressure, sneezing, sore throat, tinnitus, trouble swallowing and voice change.   Eyes: Negative for photophobia, pain, discharge, redness, itching and visual disturbance.  Respiratory: Negative for apnea, cough, choking, chest tightness, shortness of breath, wheezing and stridor.   Cardiovascular: Negative for chest pain, palpitations and leg swelling.  Gastrointestinal: Negative for abdominal distention, abdominal pain, anal bleeding, blood in stool, constipation, diarrhea, nausea, rectal pain and vomiting.    Endocrine: Negative for cold intolerance, heat intolerance, polydipsia, polyphagia and polyuria.  Genitourinary: Negative for decreased urine volume, difficulty urinating, dyspareunia, dysuria, enuresis, flank pain, frequency, genital sores, hematuria, menstrual problem, pelvic pain, urgency, vaginal bleeding, vaginal discharge and vaginal pain.       Bedtime 12:00; wakes up 8:00am.   Musculoskeletal: Negative for arthralgias, back pain, gait problem, joint swelling, myalgias, neck pain and neck stiffness.  Skin: Negative for color change, pallor, rash and wound.  Allergic/Immunologic: Negative for environmental allergies, food allergies and immunocompromised state.  Neurological: Positive for weakness and numbness. Negative for dizziness, tremors, seizures, syncope, facial asymmetry, speech difficulty, light-headedness and headaches.  Hematological: Negative for adenopathy. Does not bruise/bleed easily.  Psychiatric/Behavioral: Negative for agitation, behavioral problems, confusion, decreased concentration, dysphoric mood, hallucinations, self-injury, sleep disturbance and suicidal ideas. The patient is not nervous/anxious and is not hyperactive.     Past Medical History:  Diagnosis Date  . Acute myocardial infarction, subendocardial infarction, initial episode of care (HCC) 07/21/2012  . Acute osteomyelitis involving ankle and foot (HCC) 02/06/2015  . Acute systolic heart failure (HCC) 07/21/2012   New onset 07/19/12; admission to [facility] ED. Elevated Troponins.  S/p 2D-echo with EF 20-25%.  S/p cardiac catheterization with stenting LAD.  Repeat 2D-echo 10/2011 with improved EF of 35%.   . Anemia   . Automatic implantable cardioverter-defibrillator in situ    a. MDT CRT-D 06/2014, SN: [id number]    . CAD (coronary artery disease)    a. cardiac cath 101/04/2012: PCI/DES to chronically occluded mLAD, consideration PCI to diag branch in 4 weeks.   . Cataract   . Chicken pox   . Chronic  systolic CHF (congestive heart failure) (HCC)    a. mixed ICM & NICM; b. EF  20-25% by echo 07/2012, mid-dist 2/3 of LV sev HK/AK, mild MR. echo 10/2012: EF 30-35%, sev HK ant-septal & inf walls, GR1DD, mild MR, PASP 33. c. echo 02/2013: EF 30%, GR1DD, mild MR. echo 04/2014: EF 30%, Septal-lat dyssynchrony, global HK, inf AK, GR1DD, mild MR. d. echo 10/2014: EF50-55%, WM nl, GR1DD, septal mild paradox. e. echo 02/2015: EF 50-55%, wm   . Depression   . Heart attack   . Heel ulcer (HCC) 04/27/2015  . History of blood transfusion ~ 2011   "plasma; had neuropathy; couldn't walk"  . Hypertension   . LBBB (left bundle branch block)   . Neuropathy (HCC) 2011  . Obesity, unspecified   . OSA on CPAP    Moderate with AHI 23/hr and now on CPAP at 16cm H2O.  Her DME is [facility]  . Pure hypercholesterolemia   . Type II diabetes mellitus (HCC)   . Unspecified vitamin D deficiency    Past Surgical History:  Procedure Laterality Date  . AMPUTATION TOE Right 06/18/2015   Procedure: AMPUTATION TOE;  Surgeon: Gwyneth Revels, DPM;  Location: [facility] ORS;  Service: Podiatry;  Laterality: Right;  . BACK SURGERY    . BI-VENTRICULAR IMPLANTABLE CARDIOVERTER DEFIBRILLATOR N/A 07/06/2014   Procedure: BI-VENTRICULAR IMPLANTABLE CARDIOVERTER DEFIBRILLATOR  (CRT-D);  Surgeon: Duke Salvia, MD;  Location: [facility] CATH LAB;  Service: Cardiovascular;  Laterality: N/A;  . BI-VENTRICULAR IMPLANTABLE CARDIOVERTER DEFIBRILLATOR  (CRT-D)  07/06/2014  . BILATERAL OOPHORECTOMY  01/2011   ovarian cyst benign  . COLONOSCOPY WITH PROPOFOL Left 02/22/2015   Procedure: COLONOSCOPY WITH PROPOFOL;  Surgeon: Wallace Cullens, MD;  Location: [facility] ENDOSCOPY;  Service: Endoscopy;  Laterality: Left;  . CORONARY ANGIOPLASTY WITH STENT PLACEMENT Left 07/2012   new onset systolic CHF; elevated troponins.  Cardiac catheterization with stenting to LAD; EF 15%.  2D-echo: EF 20-25%.  . ESOPHAGOGASTRODUODENOSCOPY N/A 02/22/2015   Procedure: ESOPHAGOGASTRODUODENOSCOPY  (EGD);  Surgeon: Wallace Cullens, MD;  Location: [facility] ENDOSCOPY;  Service: Endoscopy;  Laterality: N/A;  . INCISION AND DRAINAGE ABSCESS Right 2007   groin; with ICU stay due to sepsis.  Marland Kitchen LAPAROSCOPIC CHOLECYSTECTOMY  2011  . LEFT HEART CATHETERIZATION WITH CORONARY ANGIOGRAM N/A 07/21/2012   Procedure: LEFT HEART CATHETERIZATION WITH CORONARY ANGIOGRAM;  Surgeon: Dolores Patty, MD;  Location: [facility] CATH LAB;  Service: Cardiovascular;  Laterality: N/A;  . LUMBAR DISC SURGERY  1996   L4-5  . PERCUTANEOUS CORONARY STENT INTERVENTION (PCI-S) N/A 07/23/2012   Procedure: PERCUTANEOUS CORONARY STENT INTERVENTION (PCI-S);  Surgeon: Tonny Bollman, MD;  Location: [facility] CATH LAB;  Service: Cardiovascular;  Laterality: N/A;  . PERIPHERAL VASCULAR CATHETERIZATION N/A 02/10/2015   Procedure: Picc Line Insertion;  Surgeon: Renford Dills, MD;  Location: [facility] INVASIVE CV LAB;  Service: Cardiovascular;  Laterality: N/A;  . TUBAL LIGATION  1981  . VAGINAL HYSTERECTOMY  01/2011   Fibroids/DUB.  Ovaries removed. [place].   No Known Allergies Current Outpatient Prescriptions  Medication Sig Dispense Refill  . AMBULATORY NON FORMULARY MEDICATION 1 Units by Other route once. Medication Name: foot brace 1 Units 0  . Calcium Carb-Cholecalciferol (CALCIUM 600 + D PO) Take 1 tablet by mouth 2 (two) times daily.    . citalopram (CELEXA) 20 MG tablet Take 1 tablet (20 mg total) by mouth daily. 90 tablet 1  . clopidogrel (PLAVIX) 75 MG tablet Take 1 tablet (75 mg total) by mouth daily. 90 tablet 3  . gabapentin (NEURONTIN) 600 MG tablet Take 1 tablet (600 mg total) by mouth  3 (three) times daily. 270 tablet 3  . glimepiride (AMARYL) 4 MG tablet TAKE 1 TABLET BY MOUTH 2 TIMES DAILY BEFORE A MEAL 180 tablet 0  . insulin aspart (NOVOLOG) 100 UNIT/ML FlexPen Inject 5 Units into the skin 3 (three) times daily with meals. 15 mL 11  . Insulin Glargine (LANTUS) 100 UNIT/ML Solostar Pen Inject 18 Units into the skin at bedtime. 15  mL 11  . isosorbide mononitrate (IMDUR) 30 MG 24 hr tablet Take 1 tablet (30 mg total) by mouth daily. 30 tablet 6  . MULTIPLE VITAMIN PO Take 1 tablet by mouth every morning.     . mupirocin ointment (BACTROBAN) 2 % Apply to affected area 3 times daily 22 g 0  . nitroGLYCERIN (NITROSTAT) 0.4 MG SL tablet Place under the tongue. Reported on 04/16/2016    . Omega-3 Fatty Acids (FISH OIL) 1000 MG CAPS Take 1,000 mg by mouth 2 (two) times daily.     Marland Kitchen OVER THE COUNTER MEDICATION     . pravastatin (PRAVACHOL) 40 MG tablet Take 1 tablet (40 mg total) by mouth at bedtime. 90 tablet 3  . torsemide (DEMADEX) 20 MG tablet Take 1 tablet (20 mg total) by mouth daily. 30 tablet 11  . traMADol (ULTRAM) 50 MG tablet TAKE 1 TABLET EVERY 6 HOURS AS NEEDED FOR PAIN 120 tablet 5  . carvedilol (COREG) 3.125 MG tablet Take 1 tablet (3.125 mg total) by mouth 2 (two) times daily. 60 tablet 3  . ONE TOUCH ULTRA TEST test strip USE TO CHECK BLOOD SUGAR 3 TIMES A DAY 100 each 2   No current facility-administered medications for this visit.    Social History   Social History  . Marital status: Married    Spouse name: N/A  . Number of children: 2  . Years of education: 12   Occupational History  . disabled Unemployed    03/2010 for peripheral neuropathy  . home daycare     x 20 yrs.   Social History Main Topics  . Smoking status: Never Smoker  . Smokeless tobacco: Never Used  . Alcohol use No  . Drug use: No  . Sexual activity: No   Other Topics Concern  . Not on file   Social History Narrative   Marital status:Married x 44 yrs. Happily married, no abuse.       Children:  2 children (daughter 51, son 36). Two grandsons and 2 step grandchildren.       Lives: with husband, daughter, son-in-law, 2 grandsons.      Employment: disability for peripheral neuropathy 2012.  Previously had home daycare.      Tobacco: none      Alcohol: none      Drugs: none      Exercise: none. [employer].       Pets: dog.      Always uses seat belts, smoke detectors in home.      No guns in the home.       Caffeine use: 2 cups coffee per day.       Nutrition: Well balanced diet.   Family History  Problem Relation Age of Onset  . Diabetes Mother   . Hypertension Mother   . Arthritis Mother     knees, lumbar DDD, cervical DDD  . Cancer Father     prostate,skin,lymphoma.  . Obesity Brother   . Diabetes Maternal Grandmother   . Heart disease Maternal Grandmother   . Diabetes Paternal Grandmother   .  Diabetes Son   . Hypertension Son   . Cancer Maternal Grandfather        Objective:    BP 130/70 (BP Location: Left Arm, Patient Position: Sitting, Cuff Size: Large)   Pulse 71   Temp 98 F (36.7 C) (Oral)   Resp 17   Ht 5\' 8"  (1.727 m)   Wt 288 lb (130.6 kg)   SpO2 96%   BMI 43.79 kg/m  Physical Exam  Constitutional: She is oriented to person, place, and time. She appears well-developed and well-nourished. No distress.  HENT:  Head: Normocephalic and atraumatic.  Right Ear: External ear normal.  Left Ear: External ear normal.  Nose: Nose normal.  Mouth/Throat: Oropharynx is clear and moist.  Eyes: Conjunctivae and EOM are normal. Pupils are equal, round, and reactive to light.  Neck: Normal range of motion and full passive range of motion without pain. Neck supple. No JVD present. Carotid bruit is not present. No thyromegaly present.  Cardiovascular: Normal rate, regular rhythm and normal heart sounds.  Exam reveals no gallop and no friction rub.   No murmur heard. Pulmonary/Chest: Effort normal and breath sounds normal. She has no wheezes. She has no rales. Right breast exhibits no inverted nipple, no mass, no nipple discharge, no skin change and no tenderness. Left breast exhibits no inverted nipple, no mass, no nipple discharge, no skin change and no tenderness. Breasts are symmetrical.  Abdominal: Soft. Bowel sounds are normal. She exhibits no distension and no mass. There is no  tenderness. There is no rebound and no guarding.  Genitourinary: Vagina normal. There is no rash, tenderness, lesion or injury on the right labia. There is no rash, tenderness, lesion or injury on the left labia. Right adnexum displays no mass, no tenderness and no fullness. Left adnexum displays no mass, no tenderness and no fullness.  Musculoskeletal:       Right shoulder: Normal.       Left shoulder: Normal.       Cervical back: Normal.  Lymphadenopathy:    She has no cervical adenopathy.  Neurological: She is alert and oriented to person, place, and time. She has normal reflexes. No cranial nerve deficit. She exhibits normal muscle tone. Coordination normal.  Skin: Skin is warm and dry. No rash noted. She is not diaphoretic. No erythema. No pallor.  Psychiatric: She has a normal mood and affect. Her behavior is normal. Judgment and thought content normal.  Nursing note and vitals reviewed.  Results for orders placed or performed in visit on 08/06/16  CBC with Differential/Platelet  Result Value Ref Range   WBC 8.0 3.8 - 10.8 K/uL   RBC 4.24 3.80 - 5.10 MIL/uL   Hemoglobin 12.7 11.7 - 15.5 g/dL   HCT 21.3 08.6 - 57.8 %   MCV 89.9 80.0 - 100.0 fL   MCH 30.0 27.0 - 33.0 pg   MCHC 33.3 32.0 - 36.0 g/dL   RDW 46.9 62.9 - 52.8 %   Platelets 205 140 - 400 K/uL   MPV 10.1 7.5 - 12.5 fL   Neutro Abs 4,880 1,500 - 7,800 cells/uL   Lymphs Abs 2,400 850 - 3,900 cells/uL   Monocytes Absolute 560 200 - 950 cells/uL   Eosinophils Absolute 160 15 - 500 cells/uL   Basophils Absolute 0 0 - 200 cells/uL   Neutrophils Relative % 61 %   Lymphocytes Relative 30 %   Monocytes Relative 7 %   Eosinophils Relative 2 %   Basophils Relative 0 %  Smear Review Criteria for review not met   Comprehensive metabolic panel  Result Value Ref Range   Sodium 138 135 - 146 mmol/L   Potassium 4.8 3.5 - 5.3 mmol/L   Chloride 100 98 - 110 mmol/L   CO2 29 20 - 31 mmol/L   Glucose, Bld 80 65 - 99 mg/dL   BUN  33 (H) 7 - 25 mg/dL   Creat 0.86 (H) 5.78 - 0.99 mg/dL   Total Bilirubin 0.5 0.2 - 1.2 mg/dL   Alkaline Phosphatase 107 33 - 130 U/L   AST 15 10 - 35 U/L   ALT 10 6 - 29 U/L   Total Protein 7.1 6.1 - 8.1 g/dL   Albumin 4.0 3.6 - 5.1 g/dL   Calcium 9.1 8.6 - 46.9 mg/dL  Lipid panel  Result Value Ref Range   Cholesterol 186 125 - 200 mg/dL   Triglycerides 629 (H) <150 mg/dL   HDL 33 (L) >=52 mg/dL   Total CHOL/HDL Ratio 5.6 (H) <=5.0 Ratio   VLDL 34 (H) <30 mg/dL   LDL Cholesterol 841 <324 mg/dL  Hemoglobin M0N  Result Value Ref Range   Hgb A1c MFr Bld 7.1 (H) <5.7 %   Mean Plasma Glucose 157 mg/dL  Microalbumin, urine  Result Value Ref Range   Microalb, Ur 6.9 Not estab mg/dL  POCT urinalysis dipstick  Result Value Ref Range   Color, UA yellow yellow   Clarity, UA clear clear   Glucose, UA negative negative   Bilirubin, UA small (A) negative   Ketones, POC UA trace (5) (A) negative   Spec Grav, UA 1.015    Blood, UA trace-intact (A) negative   pH, UA 5.0    Protein Ur, POC trace (A) negative   Urobilinogen, UA 0.2    Nitrite, UA Negative Negative   Leukocytes, UA Negative Negative   Fall Risk  08/23/2016 08/13/2016 08/06/2016 04/16/2016 04/15/2016  Falls in the past year? Yes Yes No No No  Number falls in past yr: 2 or more 2 or more - - -  Injury with Fall? No No - - -  Risk for fall due to : Other (Comment) - - - -  Follow up Falls evaluation completed;Education provided;Falls prevention discussed - - - -   Depression screen [facility] 2/9 08/13/2016 08/06/2016 04/16/2016 02/19/2016 01/02/2016  Decreased Interest 0 0 0 0 0  Down, Depressed, Hopeless 1 0 0 0 0  PHQ - 2 Score 1 0 0 0 0  Altered sleeping - - - - -  Tired, decreased energy - - - - -  Change in appetite - - - - -  Feeling bad or failure about yourself  - - - - -  Trouble concentrating - - - - -  Moving slowly or fidgety/restless - - - - -  Suicidal thoughts - - - - -  PHQ-9 Score - - - - -  Some recent data  might be hidden   Functional Status Survey: Is the patient deaf or have difficulty hearing?: No Does the patient have difficulty seeing, even when wearing glasses/contacts?: No Does the patient have difficulty concentrating, remembering, or making decisions?: No Does the patient have difficulty walking or climbing stairs?: No Does the patient have difficulty dressing or bathing?: No Does the patient have difficulty doing errands alone such as visiting a doctor's office or shopping?: No     Assessment & Plan:   1. Encounter for Medicare annual wellness exam   2.  Diabetic peripheral neuropathy associated with type 2 diabetes mellitus (HCC)   3. Need for prophylactic vaccination against Streptococcus pneumoniae (pneumococcus)   4. Class 3 obesity due to excess calories with serious comorbidity and body mass index (BMI) of 40.0 to 44.9 in adult [facility])    -anticipatory guidance provided--- weight loss, exercise, low-calorie food choices. -s/p Pneumovax in office. -independent with ADLs; moderate fall risk due to severe peripheral neuropathy.  Undergoing treatment for depression/anxiety.   -discussed advanced directives during visit. -refills provided.  Orders Placed This Encounter  Procedures  . Pneumococcal polysaccharide vaccine 23-valent greater than or equal to 2yo subcutaneous/IM   Meds ordered this encounter  Medications  . gabapentin (NEURONTIN) 600 MG tablet    Sig: Take 1 tablet (600 mg total) by mouth 3 (three) times daily.    Dispense:  270 tablet    Refill:  3    Return in about 3 months (around 11/13/2016) for recheck diabetes.   Eliot Bencivenga Paulita Fujita, M.D. [facility] [address] [place], [place]  [address] [phone] phone [phone] fax

## 2016-08-20 DIAGNOSIS — E11621 Type 2 diabetes mellitus with foot ulcer: Secondary | ICD-10-CM | POA: Diagnosis not present

## 2016-08-20 DIAGNOSIS — Z794 Long term (current) use of insulin: Secondary | ICD-10-CM | POA: Diagnosis not present

## 2016-08-20 DIAGNOSIS — Z89421 Acquired absence of other right toe(s): Secondary | ICD-10-CM | POA: Diagnosis not present

## 2016-08-20 DIAGNOSIS — L97509 Non-pressure chronic ulcer of other part of unspecified foot with unspecified severity: Secondary | ICD-10-CM | POA: Diagnosis not present

## 2016-08-20 DIAGNOSIS — M86171 Other acute osteomyelitis, right ankle and foot: Secondary | ICD-10-CM | POA: Diagnosis not present

## 2016-08-23 ENCOUNTER — Encounter: Payer: Self-pay | Admitting: Neurology

## 2016-08-23 ENCOUNTER — Ambulatory Visit (INDEPENDENT_AMBULATORY_CARE_PROVIDER_SITE_OTHER): Payer: PPO | Admitting: Neurology

## 2016-08-23 VITALS — BP 110/68 | HR 88 | Ht 68.0 in | Wt 286.5 lb

## 2016-08-23 DIAGNOSIS — I639 Cerebral infarction, unspecified: Secondary | ICD-10-CM | POA: Diagnosis not present

## 2016-08-23 DIAGNOSIS — E0842 Diabetes mellitus due to underlying condition with diabetic polyneuropathy: Secondary | ICD-10-CM | POA: Diagnosis not present

## 2016-08-23 NOTE — Progress Notes (Signed)
follow

## 2016-08-23 NOTE — Patient Instructions (Addendum)
You look great!    Continue plavix 75mg  daily  Return to clinic in 6 months

## 2016-08-23 NOTE — Progress Notes (Signed)
Follow-up Visit   Date: 08/23/16    [name] MRN: [id number] DOB: 09/09/1956   Interim History: [name] is a 60 y.o. right-handed Caucasian female with diabetes mellitus type 2 since age of 46 complicated by neuropathy, left leg diabetic amyotrophy (2011), and CHF s/p ICD returning to the clinic for follow-up of stroke and neuropathy. The patient was accompanied to the clinic by husband who also provides collateral information.    History of present illness: Patient has a history of diabetic amyotrophy involving the left lower extremity 2011 (HbA1c 15, + 130lb weight loss in 1 year). She was under the care of Dr. Erling Cruz at [place]. Slowly, she developed left leg weakness, falls, and numbness. Imaging showed multilevel spondylosis as well as postsurgical L4-5 and L5-S1 left hemilaminectomy and discectomy. Electrodiagnostic showed profound denervation involving the vastus medialis and iliopsoas muscles in addition to sensory abnormalities, suggesting diabetic amyotrophy. She underwent treatment with IVIG and was ultimately discharged to rehabilitation facility. Over the next 8 months, she continued to do rehabilitation and was eventually able to walk independently again, but using a walker as needed.  Patient was reportedly doing well until February 2016 when she slowly started developing painless left foot drop and bilateral proximal leg weakness. Prior to February, she was walking independently, but now is dependent on a walk.  She cannot make transfers independently. She endorses difficulty with climbing stairs and walking.  In mid-April, she developed weakness of the left last two fingers, where she is unable to extend the 4th and 5th digits on the left. She has difficulty gripping things with her left hand.   UPDATE 06/15/2015:  She has underwent a battery of tests since her last visit including serum, CSF testing, and EMGs.  There was evidence of  sensorimotor neuropathy as well as polyradiculoneuropathy.  CSF testing showed slightly elevated protein, otherwise was normal.  More notable was that her inflammatory markers, ANA, and p-ANCA returned elevated.  She saw rheumatology who ordered additional testing, but patient is unaware of these results and did not have a return visit.  She has been undergoing hyperbaric oxygen therapy for her wound which has helped left heel ulcer, but now she has right toe ulcer.  The strength of her legs has significantly improved and over the past month, she has been walking independently.  Her left hand remains contracted which limits her ability to use it. She has been driving since October and able to control the pedal without difficulty.  She had residual left foot drop from 2011, but overall feels back to herself.    UPDATE 11/24/2015:  Patient had in interval hospitalization for stroke manifesting with left leg>arm/face numbness and weakness (NIHSS 2) on 2/4.  CT showed a small right centrum semiovale infarct.  Intracranial imaging was not performed and her echo was suboptimal.  US carotids showed <50% stenosis bilaterally.  Medical management was optimized by adding plavix 74m to aspirin 891mwhich she was already taking.  Statin therapy was added by her PCP.  She no longer has numbness of the face and arm, but has mild residual numbness of the left leg and foot.  She is also dragging the left foot more than before.  She is doing PT and has an appointment today to be fit for AFO.  UPDATE 08/23/2016:  No new complaints today and has residual left leg weakness which has been stable.  Since discontinuing aspirin, her bruises have significantly improved.  She  is now only on plavix 56m for stroke secondary prevention.  She has been compliant about using her AFO and sustained one fall.  No interval falls, neurological events, or hospitalizations.   Medications:  Current Outpatient Prescriptions on File Prior to Visit    Medication Sig Dispense Refill  . AMBULATORY NON FORMULARY MEDICATION 1 Units by Other route once. Medication Name: foot brace 1 Units 0  . Calcium Carb-Cholecalciferol (CALCIUM 600 + D PO) Take 1 tablet by mouth 2 (two) times daily.    . carvedilol (COREG) 3.125 MG tablet Take 1 tablet (3.125 mg total) by mouth 2 (two) times daily. 60 tablet 3  . citalopram (CELEXA) 20 MG tablet Take 1 tablet (20 mg total) by mouth daily. 90 tablet 1  . clopidogrel (PLAVIX) 75 MG tablet Take 1 tablet (75 mg total) by mouth daily. 90 tablet 3  . gabapentin (NEURONTIN) 600 MG tablet Take 1 tablet (600 mg total) by mouth 3 (three) times daily. 270 tablet 3  . glimepiride (AMARYL) 4 MG tablet TAKE 1 TABLET BY MOUTH 2 TIMES DAILY BEFORE A MEAL 180 tablet 0  . insulin aspart (NOVOLOG) 100 UNIT/ML FlexPen Inject 5 Units into the skin 3 (three) times daily with meals. 15 mL 11  . Insulin Glargine (LANTUS) 100 UNIT/ML Solostar Pen Inject 18 Units into the skin at bedtime. 15 mL 11  . isosorbide mononitrate (IMDUR) 30 MG 24 hr tablet Take 1 tablet (30 mg total) by mouth daily. 30 tablet 6  . MULTIPLE VITAMIN PO Take 1 tablet by mouth every morning.     . mupirocin ointment (BACTROBAN) 2 % Apply to affected area 3 times daily 22 g 0  . nitroGLYCERIN (NITROSTAT) 0.4 MG SL tablet Place under the tongue. Reported on 04/16/2016    . Omega-3 Fatty Acids (FISH OIL) 1000 MG CAPS Take 1,000 mg by mouth 2 (two) times daily.     . ONE TOUCH ULTRA TEST test strip USE TO CHECK BLOOD SUGAR 3 TIMES A DAY 100 each 0  . OVER THE COUNTER MEDICATION     . pravastatin (PRAVACHOL) 40 MG tablet Take 1 tablet (40 mg total) by mouth at bedtime. 90 tablet 3  . torsemide (DEMADEX) 20 MG tablet Take 1 tablet (20 mg total) by mouth daily. 30 tablet 11  . traMADol (ULTRAM) 50 MG tablet TAKE 1 TABLET EVERY 6 HOURS AS NEEDED FOR PAIN 120 tablet 5   No current facility-administered medications on file prior to visit.     Allergies: No Known  Allergies  Review of Systems:  CONSTITUTIONAL: No fevers, chills, night sweats, or weight loss.  EYES: No visual changes or eye pain ENT: No hearing changes.  No history of nose bleeds.   RESPIRATORY: No cough, wheezing and shortness of breath.   CARDIOVASCULAR: Negative for chest pain, and palpitations.   GI: Negative for abdominal discomfort, blood in stools or black stools.  No recent change in bowel habits.   GU:  No history of incontinence.   MUSCLOSKELETAL: +history of joint pain or swelling.  No myalgias.   SKIN: Negative for lesions, rash, and itching.   ENDOCRINE: Negative for cold or heat intolerance, polydipsia or goiter.   PSYCH:  No depression or anxiety symptoms.   NEURO: As Above.   Vital Signs:  BP 110/68   Pulse 88   Ht 5' 8"  (1.727 m)   Wt 286 lb 8 oz (130 kg)   SpO2 96%   BMI 43.56 kg/m  Neurological Exam: MENTAL STATUS including orientation to time, place, person, recent and remote memory, attention span and concentration, language, and fund of knowledge is normal.  .  CRANIAL NERVES:   Face is symmetric.   MOTOR:  Left TA atrophy (stable)    Right Upper Extremity:    Left Upper Extremity:    Deltoid  5/5   Deltoid  5/5   Biceps  5/5   Biceps  5/5   Triceps  5/5   Triceps  5/5   Wrist extensors  5/5   Wrist extensors  5/5   Wrist flexors  5/5   Wrist flexors  5/5   Finger extensors  5/5   Finger extensors * Limited by flexion contracture of the 3-5th digits at the MCP and PIP 4+/5   Finger flexors  5/5   Finger flexors  5/5   Dorsal interossei  5/5   Dorsal interossei  5/5   Abductor pollicis  5/5   Abductor pollicis  5/5   Tone (Ashworth scale)  0  Tone (Ashworth scale)  0   Right Lower Extremity:    Left Lower Extremity:    Hip flexors  5/5   Hip flexors  5/5   Hip extensors  5/5   Hip extensors  5/5   Knee flexors  5/5   Knee flexors  5/5   Knee extensors  5/5   Knee extensors  5/5   Dorsiflexors  5/5   Dorsiflexors  2/5   Plantarflexors   5/5   Plantarflexors  3/5   Toe extensors  5/5   Toe extensors  2/5   Toe flexors  5/5   Toe flexors  2/5   Tone (Ashworth scale)  0  Tone (Ashworth scale)  0    MSRs:  Right                                                                 Left brachioradialis 2+  brachioradialis 2+  biceps 2+  biceps 2+  triceps 1+  triceps 1+  patellar trace  patellar 0  ankle jerk 0  ankle jerk 0   SENSORY: Vibration and temperature absent at ankles bilaterally, intact at knees.     COORDINATION/GAIT:  Gait assisted with left AFO and stable.   Data: MRI lumbar spine 05/17/2010: 1. Unchanged MRI from 03/22/2010. No epidural abscess is identified. No compression fracture or acute abnormality. 2. Postoperative changes on the left at L4-L5 and L5-S1. 3. Unchanged left paracentral and posterolateral shallow disc protrusion at L5-S1 potentially affecting the descending left S1 nerve root.  MRI brain wo contrast 05/17/2010: Small white matter lesions in the frontal lobes bilaterally. These are likely due to chronic microvascular ischemia. Demyelinating disease is not considered likely in this case. No acute abnormality.  EMG of the right lower extremity 03/28/2015: The electrophysiologic findings are most consistent with a severe generalized sensorimotor polyneuropathy, axon loss in type, affecting the right lower extremity. A superimposed multilevel intraspinal canal lesion (i.e. radiculopathy) cannot be excluded.  EMG of the upper extremities 03/21/2015:  The electrophysiologic findings are most consistent with a severe subacute sensorimotor polyradiculoneuropathy affecting the upper extremities  Labs 01/22/2015:  SPEP/UPEP with IFE no M protein, ANA 1:640* homogenous pattern, P-ANCA 1:640*, RF < 10,  ACE 46, copper 131, ceruloplasmin 31, ESR 65, vitamin B1 17, CK 24, ENA negative, CRP 15.2*, cryoglobulins none detected, aldolase 8.0  CSF 04/28/2015:  W1  R0  G73  P63*   Cryptococcus neg, ACE 11, one  OCB in CSF and serum, MBP <2.0, Lyme neg, IgG index 0.45  Lab Results  Component Value Date   HGBA1C 7.1 (H) 08/06/2016   US carotids 11/24/2015: Mild atherosclerotic disease in the carotid arteries bilaterally. Estimated degree of stenosis in the internal carotid arteries is less than 50% bilaterally. Patent vertebral arteries.  Left thyroid nodule measuring up to 2.2 cm. This nodule meets criteria for biopsy. Recommend dedicated ultrasound of the thyroid to look for additional nodules and consider biopsy of the dominant left thyroid nodule.   CT head wo contrast 11/11/2015: Small rounded focal low density seen in right centrum semiovale concerning for focal infarction of indeterminate age. These results were called by telephone at the time of interpretation on 11/11/2015 at 9:49 am to Dr. Lavonia Drafts , who verbally acknowledged these results.  Lab Results  Component Value Date   CHOL 186 08/06/2016   HDL 33 (L) 08/06/2016   LDLCALC 119 08/06/2016   TRIG 170 (H) 08/06/2016   CHOLHDL 5.6 (H) 08/06/2016    IMPRESSION/PLAN: 1.  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  Left foot drop due to known history of stroke and diabetic amyotrophy, clinically stable  - Continue AFO and home PT exercises  Return to clinic in 6 months  The duration of this appointment visit was 20 minutes of face-to-face time  with the patient.  Greater than 50% of this time was spent in counseling, explanation of diagnosis, planning of further management, and coordination of care.   Thank you for allowing me to participate in patient's care.  If I can answer any additional questions, I would be pleased to do so.    Sincerely,    Donika K. PPosey Pronto DO

## 2016-08-27 ENCOUNTER — Other Ambulatory Visit: Payer: Self-pay | Admitting: Family Medicine

## 2016-08-27 ENCOUNTER — Other Ambulatory Visit: Payer: Self-pay | Admitting: *Deleted

## 2016-08-27 MED ORDER — CARVEDILOL 3.125 MG PO TABS: 3.1250 mg | ORAL_TABLET | Freq: Two times a day (BID) | ORAL | 3 refills | Status: DC

## 2016-08-31 NOTE — Telephone Encounter (Signed)
08/2016 last ov

## 2016-09-02 DIAGNOSIS — IMO0001 Reserved for inherently not codable concepts without codable children: Secondary | ICD-10-CM | POA: Insufficient documentation

## 2016-09-25 IMAGING — CT NUCLEAR MEDICINE THREE PHASE BONE SCAN
1 of 3 series · 1 of 14 positions shown · non-contrast
Comparison: None.

CLINICAL DATA: Chronic open wound of left foot for 6 weeks.
Infection, swelling and diabetes.

EXAM:
NUCLEAR MEDICINE 3-PHASE BONE SCAN
TECHNIQUE: Radionuclide angiographic images, immediate static blood pool
images, and 3-hour delayed static images were obtained of the feet
after intravenous injection of radiopharmaceutical.
RADIOPHARMACEUTICALS:  23.49 Technetium 99 MDP

[Series 1001: fused axial · 1 of 69 slices shown]
[im 35/69]
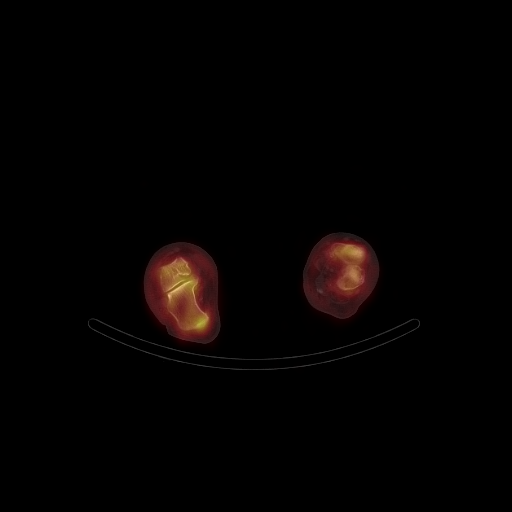

[1 of 14 positions shown; findings below may reference images not displayed]

FINDINGS: Vascular phase: There is slight asymmetric increased perfusion to
the left hindfoot on the early vascular phase.

Blood pool phase: Asymmetric increased uptake localizing to the left
hindfoot. Increased blood pool activity localizing to the second or
third left phalanx also noted.

Delayed phase: Increased radiotracer uptake localizing to the
midfoot and hindfoot regions of both feet noted. Increased delayed
phase uptake localizes to the left second phalanx.
IMPRESSION: 1. There is increased uptake on all 3 phases localizing to the left
hindfoot region. This may be a manifestation of osteomyelitis.
Correlation with exact site of nonhealing ulcer.
2. There is increased uptake localizing to the second phalanx of the
left foot on the blood pool and delayed phase images. This is a
nonspecific finding and may be related to chronic neuropathic
arthropathy. Underlying infection not excluded.

## 2016-10-17 ENCOUNTER — Telehealth: Payer: Self-pay

## 2016-10-17 NOTE — Telephone Encounter (Signed)
Pt calling saying pharmacy sent over a refill request for her torsemide she is out and needs a refill   Please advise [phone]

## 2016-10-18 ENCOUNTER — Other Ambulatory Visit: Payer: Self-pay | Admitting: *Deleted

## 2016-10-18 DIAGNOSIS — I5021 Acute systolic (congestive) heart failure: Secondary | ICD-10-CM

## 2016-10-18 MED ORDER — TORSEMIDE 20 MG PO TABS: 20.0000 mg | ORAL_TABLET | Freq: Every day | ORAL | 3 refills | Status: DC

## 2016-10-21 NOTE — Telephone Encounter (Signed)
Refill approved.

## 2016-10-22 ENCOUNTER — Ambulatory Visit (INDEPENDENT_AMBULATORY_CARE_PROVIDER_SITE_OTHER): Payer: PPO | Admitting: *Deleted

## 2016-10-22 DIAGNOSIS — I5022 Chronic systolic (congestive) heart failure: Secondary | ICD-10-CM

## 2016-10-22 DIAGNOSIS — I255 Ischemic cardiomyopathy: Secondary | ICD-10-CM | POA: Diagnosis not present

## 2016-10-22 NOTE — Progress Notes (Signed)
Remote ICD transmission.   

## 2016-10-26 LAB — CUP PACEART REMOTE DEVICE CHECK
Brady Statistic AP VP Percent: 22.66 %
Brady Statistic AS VP Percent: 77.23 %
Brady Statistic AS VS Percent: 0.1 %
Brady Statistic RV Percent Paced: 99.63 %
HIGH POWER IMPEDANCE MEASURED VALUE: 66 Ohm
Implantable Lead Implant Date: 20150930
Implantable Lead Implant Date: 20150930
Implantable Lead Location: 753857
Implantable Lead Location: 753860
Implantable Lead Model: 5076
Lead Channel Impedance Value: 323 Ohm
Lead Channel Impedance Value: 342 Ohm
Lead Channel Impedance Value: 380 Ohm
Lead Channel Impedance Value: 570 Ohm
Lead Channel Impedance Value: 589 Ohm
Lead Channel Impedance Value: 627 Ohm
Lead Channel Pacing Threshold Amplitude: 0.5 V
Lead Channel Pacing Threshold Amplitude: 0.75 V
Lead Channel Pacing Threshold Amplitude: 0.75 V
Lead Channel Pacing Threshold Pulse Width: 0.4 ms
Lead Channel Pacing Threshold Pulse Width: 0.4 ms
Lead Channel Sensing Intrinsic Amplitude: 1.375 mV
Lead Channel Sensing Intrinsic Amplitude: 31.625 mV
Lead Channel Setting Sensing Sensitivity: 0.3 mV
MDC IDC LEAD IMPLANT DT: 20150930
MDC IDC LEAD LOCATION: 753859
MDC IDC MSMT BATTERY REMAINING LONGEVITY: 74 mo
MDC IDC MSMT BATTERY VOLTAGE: 2.98 V
MDC IDC MSMT LEADCHNL LV IMPEDANCE VALUE: 323 Ohm
MDC IDC MSMT LEADCHNL LV IMPEDANCE VALUE: 380 Ohm
MDC IDC MSMT LEADCHNL LV IMPEDANCE VALUE: 437 Ohm
MDC IDC MSMT LEADCHNL LV IMPEDANCE VALUE: 570 Ohm
MDC IDC MSMT LEADCHNL LV IMPEDANCE VALUE: 589 Ohm
MDC IDC MSMT LEADCHNL RA PACING THRESHOLD PULSEWIDTH: 0.4 ms
MDC IDC MSMT LEADCHNL RA SENSING INTR AMPL: 1.375 mV
MDC IDC MSMT LEADCHNL RV IMPEDANCE VALUE: 380 Ohm
MDC IDC MSMT LEADCHNL RV IMPEDANCE VALUE: 456 Ohm
MDC IDC MSMT LEADCHNL RV SENSING INTR AMPL: 31.625 mV
MDC IDC PG IMPLANT DT: 20150930
MDC IDC SESS DTM: 20180116093824
MDC IDC SET LEADCHNL LV PACING AMPLITUDE: 2 V
MDC IDC SET LEADCHNL LV PACING PULSEWIDTH: 0.4 ms
MDC IDC SET LEADCHNL RA PACING AMPLITUDE: 1.5 V
MDC IDC SET LEADCHNL RV PACING AMPLITUDE: 2 V
MDC IDC SET LEADCHNL RV PACING PULSEWIDTH: 0.4 ms
MDC IDC STAT BRADY AP VS PERCENT: 0.01 %
MDC IDC STAT BRADY RA PERCENT PACED: 22.59 %

## 2016-10-28 ENCOUNTER — Other Ambulatory Visit: Payer: Self-pay | Admitting: Family Medicine

## 2016-10-28 DIAGNOSIS — E119 Type 2 diabetes mellitus without complications: Secondary | ICD-10-CM

## 2016-10-30 ENCOUNTER — Encounter: Payer: Self-pay | Admitting: Cardiology

## 2016-10-31 DIAGNOSIS — H524 Presbyopia: Secondary | ICD-10-CM | POA: Diagnosis not present

## 2016-10-31 DIAGNOSIS — E113293 Type 2 diabetes mellitus with mild nonproliferative diabetic retinopathy without macular edema, bilateral: Secondary | ICD-10-CM | POA: Diagnosis not present

## 2016-10-31 DIAGNOSIS — H25813 Combined forms of age-related cataract, bilateral: Secondary | ICD-10-CM | POA: Diagnosis not present

## 2016-10-31 DIAGNOSIS — H2512 Age-related nuclear cataract, left eye: Secondary | ICD-10-CM | POA: Diagnosis not present

## 2016-10-31 DIAGNOSIS — H2511 Age-related nuclear cataract, right eye: Secondary | ICD-10-CM | POA: Diagnosis not present

## 2016-11-06 ENCOUNTER — Other Ambulatory Visit: Payer: Self-pay | Admitting: Family Medicine

## 2016-11-06 ENCOUNTER — Encounter: Payer: Self-pay | Admitting: Family Medicine

## 2016-11-06 ENCOUNTER — Ambulatory Visit (INDEPENDENT_AMBULATORY_CARE_PROVIDER_SITE_OTHER): Payer: PPO | Admitting: Family Medicine

## 2016-11-06 VITALS — BP 121/70 | HR 64 | Temp 98.2°F | Resp 16 | Ht 68.0 in | Wt 284.0 lb

## 2016-11-06 DIAGNOSIS — N182 Chronic kidney disease, stage 2 (mild): Secondary | ICD-10-CM

## 2016-11-06 DIAGNOSIS — I5022 Chronic systolic (congestive) heart failure: Secondary | ICD-10-CM

## 2016-11-06 DIAGNOSIS — E1142 Type 2 diabetes mellitus with diabetic polyneuropathy: Secondary | ICD-10-CM | POA: Diagnosis not present

## 2016-11-06 DIAGNOSIS — E7800 Pure hypercholesterolemia, unspecified: Secondary | ICD-10-CM

## 2016-11-06 DIAGNOSIS — I2510 Atherosclerotic heart disease of native coronary artery without angina pectoris: Secondary | ICD-10-CM | POA: Diagnosis not present

## 2016-11-06 DIAGNOSIS — G4733 Obstructive sleep apnea (adult) (pediatric): Secondary | ICD-10-CM | POA: Diagnosis not present

## 2016-11-06 NOTE — Progress Notes (Signed)
Subjective:    Patient ID: [name], female    DOB: Jun 26, 1956, 61 y.o.   MRN: [id number]  11/06/2016  Follow-up (3 mon follow up/ dm check)   HPI This 61 y.o. female presents for three month follow-up of DMII with neuropathy, hypercholesterolemia, CAD/CHF, anxiety/depression.  Fasting sugars running 79-155.  Post-prandial sugars 138-400.  Forgetting Amaryl 50% of time; husband eats a lot of sweets at night. Sugar is so sporadic in evenings.  Days are crazy; taking care of baby. Has decreased appetite.   Suffered with acute illness.  Mother sick also.     Immunization History  Administered Date(s) Administered  . Hepatitis B 03/16/2013  . Hepatitis B, adult 03/30/2014, 07/18/2014  . Influenza Split 07/21/2012  . Influenza,inj,Quad PF,36+ Mos 06/16/2013, 07/07/2014, 06/18/2015, 08/06/2016  . Pneumococcal Polysaccharide-23 08/08/2011, 06/18/2015, 08/13/2016  . Tdap 03/07/2010, 05/03/2016  . Zoster 04/06/2014   BP Readings from Last 3 Encounters:  11/06/16 121/70  08/23/16 110/68  08/13/16 130/70   Wt Readings from Last 3 Encounters:  11/06/16 284 lb (128.8 kg)  08/23/16 286 lb 8 oz (130 kg)  08/13/16 288 lb (130.6 kg)    Review of Systems  Constitutional: Negative for chills, diaphoresis, fatigue and fever.  Eyes: Negative for visual disturbance.  Respiratory: Negative for cough and shortness of breath.   Cardiovascular: Negative for chest pain, palpitations and leg swelling.  Gastrointestinal: Negative for abdominal pain, constipation, diarrhea, nausea and vomiting.  Endocrine: Negative for cold intolerance, heat intolerance, polydipsia, polyphagia and polyuria.  Neurological: Positive for weakness and numbness. Negative for dizziness, tremors, seizures, syncope, facial asymmetry, speech difficulty, light-headedness and headaches.    Past Medical History:  Diagnosis Date  . Acute myocardial infarction, subendocardial infarction, initial episode of care (HCC)  07/21/2012  . Acute osteomyelitis involving ankle and foot (HCC) 02/06/2015  . Acute systolic heart failure (HCC) 07/21/2012   New onset 07/19/12; admission to [facility] ED. Elevated Troponins.  S/p 2D-echo with EF 20-25%.  S/p cardiac catheterization with stenting LAD.  Repeat 2D-echo 10/2011 with improved EF of 35%.   . Anemia   . Automatic implantable cardioverter-defibrillator in situ    a. MDT CRT-D 06/2014, SN: [id number]    . CAD (coronary artery disease)    a. cardiac cath 101/04/2012: PCI/DES to chronically occluded mLAD, consideration PCI to diag branch in 4 weeks.   . Cataract   . Chicken pox   . Chronic systolic CHF (congestive heart failure) (HCC)    a. mixed ICM & NICM; b. EF 20-25% by echo 07/2012, mid-dist 2/3 of LV sev HK/AK, mild MR. echo 10/2012: EF 30-35%, sev HK ant-septal & inf walls, GR1DD, mild MR, PASP 33. c. echo 02/2013: EF 30%, GR1DD, mild MR. echo 04/2014: EF 30%, Septal-lat dyssynchrony, global HK, inf AK, GR1DD, mild MR. d. echo 10/2014: EF50-55%, WM nl, GR1DD, septal mild paradox. e. echo 02/2015: EF 50-55%, wm   . Depression   . Heart attack   . Heel ulcer (HCC) 04/27/2015  . History of blood transfusion ~ 2011   "plasma; had neuropathy; couldn't walk"  . Hypertension   . LBBB (left bundle branch block)   . Neuropathy (HCC) 2011  . Obesity, unspecified   . OSA on CPAP    Moderate with AHI 23/hr and now on CPAP at 16cm H2O.  Her DME is [facility]  . Pure hypercholesterolemia   . Type II diabetes mellitus (HCC)   . Unspecified vitamin D deficiency    Past Surgical  History:  Procedure Laterality Date  . AMPUTATION TOE Right 06/18/2015   Procedure: AMPUTATION TOE;  Surgeon: Gwyneth Revels, DPM;  Location: [facility] ORS;  Service: Podiatry;  Laterality: Right;  . BACK SURGERY    . BI-VENTRICULAR IMPLANTABLE CARDIOVERTER DEFIBRILLATOR N/A 07/06/2014   Procedure: BI-VENTRICULAR IMPLANTABLE CARDIOVERTER DEFIBRILLATOR  (CRT-D);  Surgeon: Duke Salvia, MD;  Location: [facility] CATH LAB;   Service: Cardiovascular;  Laterality: N/A;  . BI-VENTRICULAR IMPLANTABLE CARDIOVERTER DEFIBRILLATOR  (CRT-D)  07/06/2014  . BILATERAL OOPHORECTOMY  01/2011   ovarian cyst benign  . COLONOSCOPY WITH PROPOFOL Left 02/22/2015   Procedure: COLONOSCOPY WITH PROPOFOL;  Surgeon: Wallace Cullens, MD;  Location: [facility] ENDOSCOPY;  Service: Endoscopy;  Laterality: Left;  . CORONARY ANGIOPLASTY WITH STENT PLACEMENT Left 07/2012   new onset systolic CHF; elevated troponins.  Cardiac catheterization with stenting to LAD; EF 15%.  2D-echo: EF 20-25%.  . ESOPHAGOGASTRODUODENOSCOPY N/A 02/22/2015   Procedure: ESOPHAGOGASTRODUODENOSCOPY (EGD);  Surgeon: Wallace Cullens, MD;  Location: [facility] ENDOSCOPY;  Service: Endoscopy;  Laterality: N/A;  . INCISION AND DRAINAGE ABSCESS Right 2007   groin; with ICU stay due to sepsis.  Marland Kitchen LAPAROSCOPIC CHOLECYSTECTOMY  2011  . LEFT HEART CATHETERIZATION WITH CORONARY ANGIOGRAM N/A 07/21/2012   Procedure: LEFT HEART CATHETERIZATION WITH CORONARY ANGIOGRAM;  Surgeon: Dolores Patty, MD;  Location: [facility] CATH LAB;  Service: Cardiovascular;  Laterality: N/A;  . LUMBAR DISC SURGERY  1996   L4-5  . PERCUTANEOUS CORONARY STENT INTERVENTION (PCI-S) N/A 07/23/2012   Procedure: PERCUTANEOUS CORONARY STENT INTERVENTION (PCI-S);  Surgeon: Tonny Bollman, MD;  Location: [facility] CATH LAB;  Service: Cardiovascular;  Laterality: N/A;  . PERIPHERAL VASCULAR CATHETERIZATION N/A 02/10/2015   Procedure: Picc Line Insertion;  Surgeon: Renford Dills, MD;  Location: [facility] INVASIVE CV LAB;  Service: Cardiovascular;  Laterality: N/A;  . TUBAL LIGATION  1981  . VAGINAL HYSTERECTOMY  01/2011   Fibroids/DUB.  Ovaries removed. Fontaine.   No Known Allergies  Social History   Social History  . Marital status: Married    Spouse name: N/A  . Number of children: 2  . Years of education: 12   Occupational History  . disabled Unemployed    03/2010 for peripheral neuropathy  . home daycare     x 20 yrs.   Social  History Main Topics  . Smoking status: Never Smoker  . Smokeless tobacco: Never Used  . Alcohol use No  . Drug use: No  . Sexual activity: No   Other Topics Concern  . Not on file   Social History Narrative   Marital status:Married x 44 yrs. Happily married, no abuse.       Children:  2 children (daughter 101, son 62). Two grandsons and 2 step grandchildren.       Lives: with husband, daughter, son-in-law, 2 grandsons.      Employment: disability for peripheral neuropathy 2012.  Previously had home daycare.      Tobacco: none      Alcohol: none      Drugs: none      Exercise: none. [employer].      Pets: dog.      Always uses seat belts, smoke detectors in home.      No guns in the home.       Caffeine use: 2 cups coffee per day.       Nutrition: Well balanced diet.   Family History  Problem Relation Age of Onset  . Diabetes Mother   .  Hypertension Mother   . Arthritis Mother     knees, lumbar DDD, cervical DDD  . Cancer Father     prostate,skin,lymphoma.  . Obesity Brother   . Diabetes Maternal Grandmother   . Heart disease Maternal Grandmother   . COPD Maternal Grandmother   . Diabetes Paternal Grandmother   . Diabetes Son   . Hypertension Son   . Cancer Maternal Grandfather   . Diabetes Paternal Grandfather        Objective:    BP 121/70   Pulse 64   Temp 98.2 F (36.8 C) (Oral)   Resp 16   Ht 5\' 8"  (1.727 m)   Wt 284 lb (128.8 kg)   SpO2 96%   BMI 43.18 kg/m  Physical Exam  Constitutional: She is oriented to person, place, and time. She appears well-developed and well-nourished. No distress.  HENT:  Head: Normocephalic and atraumatic.  Right Ear: External ear normal.  Left Ear: External ear normal.  Nose: Nose normal.  Mouth/Throat: Oropharynx is clear and moist.  Eyes: Conjunctivae and EOM are normal. Pupils are equal, round, and reactive to light.  Neck: Normal range of motion. Neck supple. Carotid bruit is not present. No thyromegaly  present.  Cardiovascular: Normal rate, regular rhythm, normal heart sounds and intact distal pulses.  Exam reveals no gallop and no friction rub.   No murmur heard. Pulmonary/Chest: Effort normal and breath sounds normal. She has no wheezes. She has no rales.  Abdominal: Soft. Bowel sounds are normal. She exhibits no distension and no mass. There is no tenderness. There is no rebound and no guarding.  Lymphadenopathy:    She has no cervical adenopathy.  Neurological: She is alert and oriented to person, place, and time. No cranial nerve deficit.  Skin: Skin is warm and dry. No rash noted. She is not diaphoretic. No erythema. No pallor.  Psychiatric: She has a normal mood and affect. Her behavior is normal.        Assessment & Plan:   1. Diabetic peripheral neuropathy associated with type 2 diabetes mellitus (HCC)   2. Chronic systolic heart failure (HCC)   3. CAD in native artery   4. OSA (obstructive sleep apnea)   5. Chronic renal impairment, stage 2 (mild)   6. Pure hypercholesterolemia    -evening sugars uncontrolled due to non-compliance with evening dose of Glimepiride; change Glimepiride to 4mg  two every morning.  Encourage improved compliance with diabetic food choices. -obtain labs.  -continue current medications.   Orders Placed This Encounter  Procedures  . CBC with Differential/Platelet  . Comprehensive metabolic panel    Order Specific Question:   Has the patient fasted?    Answer:   Yes  . Hemoglobin A1c  . Lipid panel    Order Specific Question:   Has the patient fasted?    Answer:   Yes   No orders of the defined types were placed in this encounter.   Return in about 3 months (around 02/03/2017) for recheck diabetes, high cholesterol.   Jamilet Ambroise Paulita Fujita, M.D. Primary Care at [facility] previously [facility] [address] [place], [place]  [address] [phone] phone [phone] fax

## 2016-11-06 NOTE — Patient Instructions (Addendum)
CHANGE GLIMEPIRIDE/AMARYL 4MG  TO TWO TABLETS EVERY MORNING WITH BREAKFAST. DECREASE DESSERTS AT NIGHT.    IF you received an x-ray today, you will receive an invoice from [facility]. Please contact [facility] at [phone] with questions or concerns regarding your invoice.   IF you received labwork today, you will receive an invoice from [facility]. Please contact [facility] at [phone] with questions or concerns regarding your invoice.   Our billing staff will not be able to assist you with questions regarding bills from these companies.  You will be contacted with the lab results as soon as they are available. The fastest way to get your results is to activate your My Chart account. Instructions are located on the last page of this paperwork. If you have not heard from [place] regarding the results in 2 weeks, please contact this office.

## 2016-11-07 LAB — CBC WITH DIFFERENTIAL/PLATELET
Basophils Absolute: 0 10*3/uL (ref 0.0–0.2)
Basos: 0 %
EOS (ABSOLUTE): 0.1 10*3/uL (ref 0.0–0.4)
EOS: 2 %
HEMATOCRIT: 38.2 % (ref 34.0–46.6)
Hemoglobin: 12.7 g/dL (ref 11.1–15.9)
IMMATURE GRANS (ABS): 0 10*3/uL (ref 0.0–0.1)
IMMATURE GRANULOCYTES: 0 %
LYMPHS: 26 %
Lymphocytes Absolute: 2.2 10*3/uL (ref 0.7–3.1)
MCH: 29.7 pg (ref 26.6–33.0)
MCHC: 33.2 g/dL (ref 31.5–35.7)
MCV: 90 fL (ref 79–97)
MONOS ABS: 0.5 10*3/uL (ref 0.1–0.9)
Monocytes: 6 %
NEUTROS PCT: 66 %
Neutrophils Absolute: 5.3 10*3/uL (ref 1.4–7.0)
Platelets: 210 10*3/uL (ref 150–379)
RBC: 4.27 x10E6/uL (ref 3.77–5.28)
RDW: 13.5 % (ref 12.3–15.4)
WBC: 8.1 10*3/uL (ref 3.4–10.8)

## 2016-11-07 LAB — COMPREHENSIVE METABOLIC PANEL
A/G RATIO: 1.3 (ref 1.2–2.2)
ALBUMIN: 4.3 g/dL (ref 3.6–4.8)
ALT: 9 IU/L (ref 0–32)
AST: 13 IU/L (ref 0–40)
Alkaline Phosphatase: 109 IU/L (ref 39–117)
BUN/Creatinine Ratio: 30 — ABNORMAL HIGH (ref 12–28)
BUN: 48 mg/dL — ABNORMAL HIGH (ref 8–27)
Bilirubin Total: 0.3 mg/dL (ref 0.0–1.2)
CALCIUM: 9 mg/dL (ref 8.7–10.3)
CO2: 25 mmol/L (ref 18–29)
Chloride: 95 mmol/L — ABNORMAL LOW (ref 96–106)
Creatinine, Ser: 1.62 mg/dL — ABNORMAL HIGH (ref 0.57–1.00)
GFR, EST AFRICAN AMERICAN: 39 mL/min/{1.73_m2} — AB (ref >59)
GFR, EST NON AFRICAN AMERICAN: 34 mL/min/{1.73_m2} — AB (ref >59)
GLOBULIN, TOTAL: 3.2 g/dL (ref 1.5–4.5)
Glucose: 150 mg/dL — ABNORMAL HIGH (ref 65–99)
POTASSIUM: 4.8 mmol/L (ref 3.5–5.2)
SODIUM: 140 mmol/L (ref 134–144)
TOTAL PROTEIN: 7.5 g/dL (ref 6.0–8.5)

## 2016-11-07 LAB — HEMOGLOBIN A1C
Est. average glucose Bld gHb Est-mCnc: 160 mg/dL
Hgb A1c MFr Bld: 7.2 % — ABNORMAL HIGH (ref 4.8–5.6)

## 2016-11-07 LAB — LIPID PANEL
CHOLESTEROL TOTAL: 227 mg/dL — AB (ref 100–199)
Chol/HDL Ratio: 6.5 ratio units — ABNORMAL HIGH (ref 0.0–4.4)
HDL: 35 mg/dL — ABNORMAL LOW (ref >39)
LDL CALC: 145 mg/dL — AB (ref 0–99)
Triglycerides: 237 mg/dL — ABNORMAL HIGH (ref 0–149)
VLDL CHOLESTEROL CAL: 47 mg/dL — AB (ref 5–40)

## 2016-11-08 NOTE — Telephone Encounter (Signed)
Rx called into [facility].

## 2016-11-11 NOTE — Progress Notes (Signed)
Called to [place] tramadol

## 2016-11-13 ENCOUNTER — Encounter: Payer: Self-pay | Admitting: Cardiology

## 2016-11-26 ENCOUNTER — Other Ambulatory Visit: Payer: Self-pay | Admitting: Family Medicine

## 2016-12-02 DIAGNOSIS — H2513 Age-related nuclear cataract, bilateral: Secondary | ICD-10-CM | POA: Diagnosis not present

## 2016-12-03 ENCOUNTER — Telehealth: Payer: Self-pay | Admitting: Internal Medicine

## 2016-12-03 NOTE — Telephone Encounter (Signed)
Walk In pt Form- [facility]-Clearance Form Dropped off placed in [place]

## 2016-12-10 ENCOUNTER — Other Ambulatory Visit: Payer: Self-pay | Admitting: Family Medicine

## 2016-12-11 DIAGNOSIS — H2512 Age-related nuclear cataract, left eye: Secondary | ICD-10-CM | POA: Diagnosis not present

## 2016-12-11 DIAGNOSIS — H25812 Combined forms of age-related cataract, left eye: Secondary | ICD-10-CM | POA: Diagnosis not present

## 2016-12-16 NOTE — Addendum Note (Signed)
Encounter addended by: Vernie Murders, RT on: 12/16/2016 11:26 AM<BR>    Actions taken: Imaging Exam ended, Charge Capture section accepted

## 2016-12-18 ENCOUNTER — Other Ambulatory Visit: Payer: Self-pay | Admitting: Family Medicine

## 2016-12-30 DIAGNOSIS — H2511 Age-related nuclear cataract, right eye: Secondary | ICD-10-CM | POA: Diagnosis not present

## 2016-12-30 DIAGNOSIS — H25811 Combined forms of age-related cataract, right eye: Secondary | ICD-10-CM | POA: Diagnosis not present

## 2016-12-31 DIAGNOSIS — Z961 Presence of intraocular lens: Secondary | ICD-10-CM | POA: Diagnosis not present

## 2017-01-02 ENCOUNTER — Other Ambulatory Visit: Payer: Self-pay | Admitting: Family Medicine

## 2017-01-02 ENCOUNTER — Other Ambulatory Visit: Payer: Self-pay | Admitting: Physician Assistant

## 2017-01-07 ENCOUNTER — Other Ambulatory Visit: Payer: Self-pay | Admitting: Internal Medicine

## 2017-01-21 ENCOUNTER — Ambulatory Visit (INDEPENDENT_AMBULATORY_CARE_PROVIDER_SITE_OTHER): Payer: PPO | Admitting: *Deleted

## 2017-01-21 DIAGNOSIS — I255 Ischemic cardiomyopathy: Secondary | ICD-10-CM

## 2017-01-21 DIAGNOSIS — I5022 Chronic systolic (congestive) heart failure: Secondary | ICD-10-CM

## 2017-01-21 NOTE — Progress Notes (Signed)
Remote ICD transmission.   

## 2017-01-23 LAB — CUP PACEART REMOTE DEVICE CHECK
Brady Statistic AP VP Percent: 26.65 %
Brady Statistic AS VS Percent: 0.04 %
Brady Statistic RV Percent Paced: 99.72 %
HighPow Impedance: 73 Ohm
Implantable Lead Implant Date: 20150930
Implantable Lead Implant Date: 20150930
Implantable Lead Location: 753857
Implantable Lead Location: 753859
Implantable Lead Model: 4398
Lead Channel Impedance Value: 342 Ohm
Lead Channel Impedance Value: 342 Ohm
Lead Channel Impedance Value: 342 Ohm
Lead Channel Impedance Value: 399 Ohm
Lead Channel Impedance Value: 399 Ohm
Lead Channel Pacing Threshold Amplitude: 0.75 V
Lead Channel Pacing Threshold Amplitude: 0.75 V
Lead Channel Pacing Threshold Pulse Width: 0.4 ms
Lead Channel Pacing Threshold Pulse Width: 0.4 ms
Lead Channel Sensing Intrinsic Amplitude: 1.75 mV
Lead Channel Sensing Intrinsic Amplitude: 31.375 mV
Lead Channel Sensing Intrinsic Amplitude: 31.375 mV
Lead Channel Setting Pacing Amplitude: 1.5 V
Lead Channel Setting Pacing Amplitude: 2 V
Lead Channel Setting Pacing Pulse Width: 0.4 ms
MDC IDC LEAD IMPLANT DT: 20150930
MDC IDC LEAD LOCATION: 753860
MDC IDC MSMT BATTERY REMAINING LONGEVITY: 69 mo
MDC IDC MSMT BATTERY VOLTAGE: 2.98 V
MDC IDC MSMT LEADCHNL LV IMPEDANCE VALUE: 437 Ohm
MDC IDC MSMT LEADCHNL LV IMPEDANCE VALUE: 589 Ohm
MDC IDC MSMT LEADCHNL LV IMPEDANCE VALUE: 627 Ohm
MDC IDC MSMT LEADCHNL LV IMPEDANCE VALUE: 627 Ohm
MDC IDC MSMT LEADCHNL LV IMPEDANCE VALUE: 627 Ohm
MDC IDC MSMT LEADCHNL LV IMPEDANCE VALUE: 646 Ohm
MDC IDC MSMT LEADCHNL RA PACING THRESHOLD AMPLITUDE: 0.5 V
MDC IDC MSMT LEADCHNL RA PACING THRESHOLD PULSEWIDTH: 0.4 ms
MDC IDC MSMT LEADCHNL RA SENSING INTR AMPL: 1.75 mV
MDC IDC MSMT LEADCHNL RV IMPEDANCE VALUE: 437 Ohm
MDC IDC MSMT LEADCHNL RV IMPEDANCE VALUE: 494 Ohm
MDC IDC PG IMPLANT DT: 20150930
MDC IDC SESS DTM: 20180417092506
MDC IDC SET LEADCHNL RV PACING AMPLITUDE: 2 V
MDC IDC SET LEADCHNL RV PACING PULSEWIDTH: 0.4 ms
MDC IDC SET LEADCHNL RV SENSING SENSITIVITY: 0.3 mV
MDC IDC STAT BRADY AP VS PERCENT: 0.01 %
MDC IDC STAT BRADY AS VP PERCENT: 73.3 %
MDC IDC STAT BRADY RA PERCENT PACED: 26.58 %

## 2017-01-24 ENCOUNTER — Encounter: Payer: Self-pay | Admitting: Cardiology

## 2017-01-27 DIAGNOSIS — I129 Hypertensive chronic kidney disease with stage 1 through stage 4 chronic kidney disease, or unspecified chronic kidney disease: Secondary | ICD-10-CM | POA: Diagnosis not present

## 2017-01-27 DIAGNOSIS — E1122 Type 2 diabetes mellitus with diabetic chronic kidney disease: Secondary | ICD-10-CM | POA: Diagnosis not present

## 2017-01-27 DIAGNOSIS — N183 Chronic kidney disease, stage 3 (moderate): Secondary | ICD-10-CM | POA: Diagnosis not present

## 2017-02-04 ENCOUNTER — Other Ambulatory Visit: Payer: Self-pay | Admitting: Physician Assistant

## 2017-02-04 DIAGNOSIS — E119 Type 2 diabetes mellitus without complications: Secondary | ICD-10-CM

## 2017-02-06 ENCOUNTER — Other Ambulatory Visit: Payer: Self-pay | Admitting: Family Medicine

## 2017-02-10 DIAGNOSIS — N183 Chronic kidney disease, stage 3 (moderate): Secondary | ICD-10-CM | POA: Diagnosis not present

## 2017-02-10 DIAGNOSIS — D631 Anemia in chronic kidney disease: Secondary | ICD-10-CM | POA: Diagnosis not present

## 2017-02-10 DIAGNOSIS — R809 Proteinuria, unspecified: Secondary | ICD-10-CM | POA: Diagnosis not present

## 2017-02-10 DIAGNOSIS — N179 Acute kidney failure, unspecified: Secondary | ICD-10-CM | POA: Diagnosis not present

## 2017-02-10 DIAGNOSIS — E1122 Type 2 diabetes mellitus with diabetic chronic kidney disease: Secondary | ICD-10-CM | POA: Diagnosis not present

## 2017-02-10 DIAGNOSIS — I10 Essential (primary) hypertension: Secondary | ICD-10-CM | POA: Diagnosis not present

## 2017-02-11 ENCOUNTER — Other Ambulatory Visit: Payer: Self-pay | Admitting: Family Medicine

## 2017-02-11 DIAGNOSIS — Z1231 Encounter for screening mammogram for malignant neoplasm of breast: Secondary | ICD-10-CM

## 2017-02-13 ENCOUNTER — Encounter: Payer: Self-pay | Admitting: Cardiovascular Disease

## 2017-02-13 ENCOUNTER — Ambulatory Visit (INDEPENDENT_AMBULATORY_CARE_PROVIDER_SITE_OTHER): Payer: PPO | Admitting: Cardiovascular Disease

## 2017-02-13 VITALS — BP 108/62 | HR 60 | Ht 68.0 in | Wt 289.5 lb

## 2017-02-13 DIAGNOSIS — I10 Essential (primary) hypertension: Secondary | ICD-10-CM

## 2017-02-13 DIAGNOSIS — I420 Dilated cardiomyopathy: Secondary | ICD-10-CM

## 2017-02-13 DIAGNOSIS — I5022 Chronic systolic (congestive) heart failure: Secondary | ICD-10-CM

## 2017-02-13 DIAGNOSIS — E785 Hyperlipidemia, unspecified: Secondary | ICD-10-CM

## 2017-02-13 DIAGNOSIS — E7800 Pure hypercholesterolemia, unspecified: Secondary | ICD-10-CM

## 2017-02-13 DIAGNOSIS — E1142 Type 2 diabetes mellitus with diabetic polyneuropathy: Secondary | ICD-10-CM | POA: Diagnosis not present

## 2017-02-13 DIAGNOSIS — I2510 Atherosclerotic heart disease of native coronary artery without angina pectoris: Secondary | ICD-10-CM

## 2017-02-13 DIAGNOSIS — I25118 Atherosclerotic heart disease of native coronary artery with other forms of angina pectoris: Secondary | ICD-10-CM | POA: Diagnosis not present

## 2017-02-13 DIAGNOSIS — Z8673 Personal history of transient ischemic attack (TIA), and cerebral infarction without residual deficits: Secondary | ICD-10-CM | POA: Diagnosis not present

## 2017-02-13 MED ORDER — ROSUVASTATIN CALCIUM 40 MG PO TABS: 40.0000 mg | ORAL_TABLET | Freq: Every day | ORAL | 3 refills | Status: DC

## 2017-02-13 NOTE — Progress Notes (Signed)
Cardiology Office Note  Date:  02/13/2017   ID:  [name], DOB 03/03/1956, MRN [id number]  PCP:  Vickie Honour, MD   Chief Complaint  Patient presents with  . other    6 month follow up. Meds reviewed by the pt. verbally. "doing well."     HPI:  [name] is a 61 y.o. female who presents for a follow-up visit regarding   chronic systolic heart failure, previous ejection fraction 20% in 2011 now up to 50% in 2017 Status post Medtronic CRT-D implantation 9/15.  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  Chronic lower extremity edema, does not like to wear compression hose, venous insufficiency  no chest pain. Dyspnea is stable. No leg edema or orthopnea.  Previous labs reviewed personally by myself and with the patient on todays visit Total cholesterol 220s, LDL up to 140s, numbers have doubled in the past year She reports compliance with her pravastatin CR 1.6, BUN 42  EKG personally reviewed by myself on todays visit Shows paced rhythm rate 60 bpm  Other past medical history reviewed hospitalized in May,2016 at [facility] for symptomatic anemia. Hemoglobin was 7.3.    improved with IV iron. Peripheral arterial dopplers 8/15 with normal ABIs bilaterally  Sleep study 11/15 with severe OSA  Lexiscan Cardiolite in 3/16 with EF 50%, small apical infarct with  mild peri-infarct ischemia, low risk study.    She had a small stroke in early 2007 due to suspected small vessel disease.   PMH:   has a past medical history of Acute myocardial infarction, subendocardial infarction, initial episode of care [facility]) (07/21/2012); Acute osteomyelitis involving ankle and foot ([place]) (02/06/2015); Acute systolic heart failure ([place]) (07/21/2012); Anemia; Automatic implantable cardioverter-defibrillator in situ; CAD (coronary artery disease); Cataract; Chicken pox; Chronic systolic CHF (congestive heart failure) ([place]); Depression; Heart attack ([place]); Heel ulcer ([place]) (04/27/2015); History of blood transfusion (~ 2011); Hypertension; LBBB (left bundle branch block); Neuropathy (2011); Obesity, unspecified; OSA on CPAP; Pure hypercholesterolemia; Type II diabetes mellitus ([place]); and Unspecified vitamin D deficiency.  PSH:    Past Surgical History:  Procedure Laterality Date  . AMPUTATION TOE Right 06/18/2015   Procedure: AMPUTATION TOE;  Surgeon: Samara Deist, DPM;  Location: [facility] ORS;  Service: Podiatry;  Laterality: Right;  . BACK SURGERY    . BI-VENTRICULAR IMPLANTABLE CARDIOVERTER DEFIBRILLATOR N/A 07/06/2014   Procedure: BI-VENTRICULAR IMPLANTABLE CARDIOVERTER DEFIBRILLATOR  (CRT-D);  Surgeon: Deboraha Sprang, MD;  Location: [facility] CATH LAB;  Service: Cardiovascular;  Laterality: N/A;  . BI-VENTRICULAR IMPLANTABLE CARDIOVERTER DEFIBRILLATOR  (CRT-D)  07/06/2014  . BILATERAL OOPHORECTOMY  01/2011   ovarian cyst benign  . COLONOSCOPY WITH PROPOFOL Left 02/22/2015   Procedure: COLONOSCOPY WITH PROPOFOL;  Surgeon: Hulen Luster, MD;  Location[facility] ENDOSCOPY;  Service: Endoscopy;  Laterality: Left;  . CORONARY ANGIOPLASTY WITH STENT PLACEMENT Left 07/2012   new onset systolic CHF; elevated troponins.  Cardiac catheterization with stenting to LAD;  EF 15%.  2D-echo: EF 20-25%.  . ESOPHAGOGASTRODUODENOSCOPY N/A 02/22/2015   Procedure: ESOPHAGOGASTRODUODENOSCOPY (EGD);  Surgeon: Hulen Luster, MD;  Location: [facility] ENDOSCOPY;  Service: Endoscopy;  Laterality: N/A;  . INCISION AND DRAINAGE ABSCESS Right 2007   groin; with ICU stay due to sepsis.  Marland Kitchen LAPAROSCOPIC CHOLECYSTECTOMY  2011  . LEFT HEART CATHETERIZATION WITH CORONARY ANGIOGRAM N/A 07/21/2012   Procedure: LEFT HEART CATHETERIZATION WITH CORONARY ANGIOGRAM;  Surgeon: Jolaine Artist, MD;  Location: [facility] CATH LAB;  Service: Cardiovascular;  Laterality: N/A;  . [place]   L4-5  . PERCUTANEOUS CORONARY STENT INTERVENTION (PCI-S) N/A 07/23/2012   Procedure: PERCUTANEOUS CORONARY STENT INTERVENTION (PCI-S);  Surgeon: Sherren Mocha, MD;  Location: [facility] CATH LAB;  Service: Cardiovascular;  Laterality: N/A;  . PERIPHERAL VASCULAR CATHETERIZATION N/A 02/10/2015   Procedure: Picc Line Insertion;  Surgeon: Katha Cabal, MD;  Location: [place] CV LAB;  Service: Cardiovascular;  Laterality: N/A;  . TUBAL LIGATION  1981  . VAGINAL HYSTERECTOMY  01/2011   Fibroids/DUB.  Ovaries removed. [place].    Current Outpatient Prescriptions  Medication Sig Dispense Refill  . AMBULATORY NON FORMULARY MEDICATION 1 Units by Other route once. Medication Name: foot brace 1 Units 0  . B-D ULTRAFINE III SHORT PEN 31G X 8 MM MISC USE AS DIRECTED WITH LANTUS 100 each 3  . Calcium Carb-Cholecalciferol (CALCIUM 600 + D PO) Take 1 tablet by mouth 2 (two) times daily.    . carvedilol (COREG) 3.125 MG tablet TAKE 1 TABLET BY MOUTH TWICE A DAY 60 tablet 3  . citalopram (CELEXA) 20 MG tablet TAKE 1 TABLET BY MOUTH DAILY 90 tablet 0  . clopidogrel (PLAVIX) 75 MG tablet TAKE 1 TABLET BY MOUTH ONCE A DAY 90 tablet 1  . gabapentin (NEURONTIN) 600 MG tablet Take 1 tablet (600 mg total) by mouth 3 (three) times daily. 270 tablet 3  . glimepiride (AMARYL) 4 MG tablet TAKE 1 TABLET BY MOUTH TWICE A DAY BEFORE A MEAL. 180 tablet 3  . glucose blood (ONE TOUCH ULTRA TEST) test strip Use to check blood sugar three times a day 100 each 0  .  Insulin Glargine (LANTUS) 100 UNIT/ML Solostar Pen Inject 18 Units into the skin at bedtime. 15 mL 11  . isosorbide mononitrate (IMDUR) 30 MG 24 hr tablet TAKE 1 TABLET BY MOUTH ONCE DAILY 30 tablet 5  . MULTIPLE VITAMIN PO Take 1 tablet by mouth every morning.     . mupirocin ointment (BACTROBAN) 2 % Apply to affected area 3 times daily 22 g 0  . nitroGLYCERIN (NITROSTAT) 0.4 MG SL tablet Place under the tongue. Reported on 04/16/2016    . Omega-3 Fatty Acids (FISH OIL) 1000 MG CAPS Take 1,000 mg by mouth 2 (two) times daily.     Marland Kitchen OVER THE COUNTER MEDICATION     . torsemide (DEMADEX) 20 MG tablet Take 1 tablet (20 mg total) by mouth daily. 30 tablet 3  . traMADol (ULTRAM) 50 MG tablet TAKE 1 TABLET EVERY 6 HOURS AS NEEDED FOR PAIN 120 tablet 5  . rosuvastatin (CRESTOR) 40 MG tablet Take 1 tablet (40 mg total) by mouth daily. 90 tablet 3   No current facility-administered medications for this visit.      Allergies:   Patient has no known allergies.   Social History:  The patient  reports that she has never smoked. She has never used smokeless tobacco. She reports that she does not drink  alcohol or use drugs.   Family History:   family history includes Arthritis in her mother; COPD in her maternal grandmother; Cancer in her father and maternal grandfather; Diabetes in her maternal grandmother, mother, paternal grandfather, paternal grandmother, and son; Heart disease in her maternal grandmother; Hypertension in her mother and son; Obesity in her brother.    Review of Systems: Review of Systems  Constitutional: Negative.   Respiratory: Negative.   Cardiovascular: Negative.   Gastrointestinal: Negative.   Musculoskeletal: Negative.   Neurological: Negative.   Psychiatric/Behavioral: Negative.   All other systems reviewed and are negative.    PHYSICAL EXAM: VS:  BP 108/62 (BP Location: Left Arm, Patient Position: Sitting, Cuff Size: Large)   Pulse 60   Ht 5\' 8"  (1.727 m)   Wt 289  lb 8 oz (131.3 kg)   BMI 44.02 kg/m  , BMI Body mass index is 44.02 kg/m. GEN: Well nourished, well developed, in no acute distress, obese  HEENT: normal  Neck: no JVD, carotid bruits, or masses Cardiac: RRR; no murmurs, rubs, or gallops,no edema  Respiratory:  clear to auscultation bilaterally, normal work of breathing GI: soft, nontender, nondistended, + BS MS: no deformity or atrophy  Skin: warm and dry, no rash Neuro:  Strength and sensation are intact Psych: euthymic mood, full affect    Recent Labs: 04/16/2016: TSH 2.81 08/06/2016: Hemoglobin 12.7 11/06/2016: ALT 9; BUN 48; Creatinine, Ser 1.62; Platelets 210; Potassium 4.8; Sodium 140    Lipid Panel Lab Results  Component Value Date   CHOL 227 (H) 11/06/2016   HDL 35 (L) 11/06/2016   LDLCALC 145 (H) 11/06/2016   TRIG 237 (H) 11/06/2016      Wt Readings from Last 3 Encounters:  02/13/17 289 lb 8 oz (131.3 kg)  11/06/16 284 lb (128.8 kg)  08/23/16 286 lb 8 oz (130 kg)       ASSESSMENT AND PLAN:  Dilated cardiomyopathy ([place]) - Plan: EKG 12-Lead Recent ejection fraction 50% in 2017 Unable to tolerate ACE inhibitor For Aldactone secondary to hyperkalemia Blood pressure low, recently unable to tolerate losartan We'll continue current medications, carvedilol, isosorbide Appears relatively euvolemic Continue torsemide daily  Coronary artery disease of native artery of native heart with stable angina pectoris ([place]) - Plan: EKG 12-Lead Denies any anginal symptoms, will change her cholesterol management as below  Hyperlipidemia, unspecified hyperlipidemia type - Plan: Lipid panel, Hepatic function panel We have recommended she stop the pravastatin, start Crestor 20 minute grams daily for 1-2 months then up to 40 mg daily. She reports compliance with her pravastatin but LDL up to 140 Recheck lipids at the end of the summer  Chronic systolic heart failure (HCC) Appears euvolemic, even dry given elevated BUN and  creatinine Recommended she consider holding torsemide one day per week  Essential hypertension, benign Blood pressure low, unable to add additional medications given recent orthostasis on low-dose losartan  Diabetic peripheral neuropathy associated with type 2 diabetes mellitus ([place]) We have encouraged continued exercise, careful diet management in an effort to lose weight. HBA1C 7.2  History of CVA (cerebrovascular accident)  currently on Plavix  Disposition:   F/U  6 months with Dr. Fletcher Anon   Orders Placed This Encounter  Procedures  . Lipid panel  . Hepatic function panel  . EKG 12-Lead     Signed, Esmond Plants, M.D., Ph.D. 02/13/2017  [place], [place]

## 2017-02-13 NOTE — Patient Instructions (Signed)
Medication Instructions:   Please stop the pravastatin  Start crestor 1/2 pill daily Increase up to a full pill after 1 to 2 months if no muscle ache  Labwork:  Liver and lipids at the end of the summer  Testing/Procedures:  No further testing at this time   I recommend watching educational videos on topics of interest to you at:       www.goemmi.com  Enter code: HEARTCARE    Follow-Up: It was a pleasure seeing you in the office today. Please call us if you have new issues that need to be addressed before your next appt.  [phone]  Your physician wants you to follow-up in:  6 months with Dr. Fletcher Anon    If you need a refill on your cardiac medications before your next appointment, please call your pharmacy.

## 2017-02-25 ENCOUNTER — Ambulatory Visit (INDEPENDENT_AMBULATORY_CARE_PROVIDER_SITE_OTHER): Payer: PPO | Admitting: Family Medicine

## 2017-02-25 ENCOUNTER — Encounter: Payer: Self-pay | Admitting: Family Medicine

## 2017-02-25 VITALS — BP 107/64 | HR 60 | Temp 97.4°F | Resp 18 | Ht 68.5 in | Wt 283.2 lb

## 2017-02-25 DIAGNOSIS — I10 Essential (primary) hypertension: Secondary | ICD-10-CM

## 2017-02-25 DIAGNOSIS — G4733 Obstructive sleep apnea (adult) (pediatric): Secondary | ICD-10-CM | POA: Diagnosis not present

## 2017-02-25 DIAGNOSIS — D508 Other iron deficiency anemias: Secondary | ICD-10-CM

## 2017-02-25 DIAGNOSIS — N182 Chronic kidney disease, stage 2 (mild): Secondary | ICD-10-CM | POA: Diagnosis not present

## 2017-02-25 DIAGNOSIS — Z8673 Personal history of transient ischemic attack (TIA), and cerebral infarction without residual deficits: Secondary | ICD-10-CM | POA: Diagnosis not present

## 2017-02-25 DIAGNOSIS — I2510 Atherosclerotic heart disease of native coronary artery without angina pectoris: Secondary | ICD-10-CM | POA: Diagnosis not present

## 2017-02-25 DIAGNOSIS — E1142 Type 2 diabetes mellitus with diabetic polyneuropathy: Secondary | ICD-10-CM

## 2017-02-25 DIAGNOSIS — E7800 Pure hypercholesterolemia, unspecified: Secondary | ICD-10-CM | POA: Diagnosis not present

## 2017-02-25 DIAGNOSIS — I519 Heart disease, unspecified: Secondary | ICD-10-CM | POA: Diagnosis not present

## 2017-02-25 NOTE — Progress Notes (Signed)
Subjective:    Patient ID: [name], female    DOB: 10-30-1955, 61 y.o.   MRN: [id number]  02/25/2017  Follow-up (DM)   HPI This 61 y.o. female presents for evaluation of DMII, hypercholesterolemia, hypertension, CAD, CHF, obesity, anxiety.  Patient reports good compliance with medication, good tolerance to medication, and good symptom control.  Checks sugars twice daily.  Fasting sugars normally stable.  Fasting sugars 70-203.  Bedtime sugars ranging 115-400. Lantus varies 10 units -- 20 units.   Amaryl 2 every morning.   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   Resistant against Novolog.  Hates shots during the day.  Must travel with it.  Must remember it.   Sugar varies so much. Must manage mother's finances now.  Mother becoming very dependent on patient.   Follow-up with cardiology on 02/13/17; changed statin therapy to Crestor.   Nephrology did not make any changes. Tried to place on Losartan yet blood pressure droppped.    Immunization History  Administered Date(s) Administered  . Hepatitis B 03/16/2013  . Hepatitis B, adult 03/30/2014, 07/18/2014  . Influenza Split 07/21/2012  . Influenza,inj,Quad PF,36+ Mos 06/16/2013, 07/07/2014, 06/18/2015, 08/06/2016  . Pneumococcal Polysaccharide-23 08/08/2011, 06/18/2015, 08/13/2016  . Tdap 03/07/2010, 05/03/2016  . Zoster 04/06/2014   BP Readings from Last 3 Encounters:  02/25/17 107/64  02/13/17 108/62  11/06/16 121/70   Wt Readings from Last 3 Encounters:  02/25/17 283 lb 3.2 oz (128.5 kg)  02/13/17 289 lb 8 oz (131.3 kg)  11/06/16 284 lb (128.8 kg)    Review of Systems  Constitutional: Negative for chills, diaphoresis, fatigue and fever.  Eyes: Negative for visual disturbance.  Respiratory: Negative for cough and shortness of breath.   Cardiovascular: Negative for chest pain, palpitations and leg swelling.  Gastrointestinal: Negative for abdominal pain, constipation, diarrhea, nausea and vomiting.  Endocrine: Negative for  cold intolerance, heat intolerance, polydipsia, polyphagia and polyuria.  Neurological: Positive for numbness. Negative for dizziness, tremors, seizures, syncope, facial asymmetry, speech difficulty, weakness, light-headedness and headaches.  Psychiatric/Behavioral: Positive for dysphoric mood. The patient is nervous/anxious.     Past Medical History:  Diagnosis Date  . Acute myocardial infarction, subendocardial infarction, initial episode of care (HCC) 07/21/2012  . Acute osteomyelitis involving ankle and foot (HCC) 02/06/2015  . Acute systolic heart failure (HCC) 07/21/2012   New onset 07/19/12; admission to [facility] ED. Elevated Troponins.  S/p 2D-echo with EF 20-25%.  S/p cardiac catheterization with stenting LAD.  Repeat 2D-echo 10/2011 with improved EF of 35%.   . Anemia   . Automatic implantable cardioverter-defibrillator in situ    a. MDT CRT-D 06/2014, SN: [id number]    . CAD (coronary artery disease)    a. cardiac cath 101/04/2012: PCI/DES to chronically occluded mLAD, consideration PCI to diag branch in 4 weeks.   . Cataract   . Chicken pox   . Chronic systolic CHF (congestive heart failure) (HCC)    a. mixed ICM & NICM; b. EF 20-25% by echo 07/2012, mid-dist 2/3 of LV sev HK/AK, mild MR. echo 10/2012: EF 30-35%, sev HK ant-septal & inf walls, GR1DD, mild MR, PASP 33. c. echo 02/2013: EF 30%, GR1DD, mild MR. echo 04/2014: EF 30%, Septal-lat dyssynchrony, global HK, inf AK, GR1DD, mild MR. d. echo 10/2014: EF50-55%, WM nl, GR1DD, septal mild paradox. e. echo 02/2015: EF 50-55%, wm   . Depression   . Heart attack (HCC)   . Heel ulcer (HCC) 04/27/2015  . History of blood transfusion ~ 2011   "  plasma; had neuropathy; couldn't walk"  . Hypertension   . LBBB (left bundle branch block)   . Neuropathy 2011  . Obesity, unspecified   . OSA on CPAP    Moderate with AHI 23/hr and now on CPAP at 16cm H2O.  Her DME is [facility]  . Pure hypercholesterolemia   . Type II diabetes mellitus (HCC)   .  Unspecified vitamin D deficiency    Past Surgical History:  Procedure Laterality Date  . AMPUTATION TOE Right 06/18/2015   Procedure: AMPUTATION TOE;  Surgeon: Gwyneth Revels, DPM;  Location: [facility] ORS;  Service: Podiatry;  Laterality: Right;  . BACK SURGERY    . BI-VENTRICULAR IMPLANTABLE CARDIOVERTER DEFIBRILLATOR N/A 07/06/2014   Procedure: BI-VENTRICULAR IMPLANTABLE CARDIOVERTER DEFIBRILLATOR  (CRT-D);  Surgeon: Duke Salvia, MD;  Location: [facility] CATH LAB;  Service: Cardiovascular;  Laterality: N/A;  . BI-VENTRICULAR IMPLANTABLE CARDIOVERTER DEFIBRILLATOR  (CRT-D)  07/06/2014  . BILATERAL OOPHORECTOMY  01/2011   ovarian cyst benign  . COLONOSCOPY WITH PROPOFOL Left 02/22/2015   Procedure: COLONOSCOPY WITH PROPOFOL;  Surgeon: Wallace Cullens, MD;  Location: [facility] ENDOSCOPY;  Service: Endoscopy;  Laterality: Left;  . CORONARY ANGIOPLASTY WITH STENT PLACEMENT Left 07/2012   new onset systolic CHF; elevated troponins.  Cardiac catheterization with stenting to LAD; EF 15%.  2D-echo: EF 20-25%.  . ESOPHAGOGASTRODUODENOSCOPY N/A 02/22/2015   Procedure: ESOPHAGOGASTRODUODENOSCOPY (EGD);  Surgeon: Wallace Cullens, MD;  Location: [facility] ENDOSCOPY;  Service: Endoscopy;  Laterality: N/A;  . INCISION AND DRAINAGE ABSCESS Right 2007   groin; with ICU stay due to sepsis.  Marland Kitchen LAPAROSCOPIC CHOLECYSTECTOMY  2011  . LEFT HEART CATHETERIZATION WITH CORONARY ANGIOGRAM N/A 07/21/2012   Procedure: LEFT HEART CATHETERIZATION WITH CORONARY ANGIOGRAM;  Surgeon: Dolores Patty, MD;  Location: [facility] CATH LAB;  Service: Cardiovascular;  Laterality: N/A;  . LUMBAR DISC SURGERY  1996   L4-5  . PERCUTANEOUS CORONARY STENT INTERVENTION (PCI-S) N/A 07/23/2012   Procedure: PERCUTANEOUS CORONARY STENT INTERVENTION (PCI-S);  Surgeon: Tonny Bollman, MD;  Location: [facility] CATH LAB;  Service: Cardiovascular;  Laterality: N/A;  . PERIPHERAL VASCULAR CATHETERIZATION N/A 02/10/2015   Procedure: Picc Line Insertion;  Surgeon: Renford Dills, MD;   Location: [facility] INVASIVE CV LAB;  Service: Cardiovascular;  Laterality: N/A;  . TUBAL LIGATION  1981  . VAGINAL HYSTERECTOMY  01/2011   Fibroids/DUB.  Ovaries removed. [place].   No Known Allergies  Social History   Social History  . Marital status: Married    Spouse name: N/A  . Number of children: 2  . Years of education: 12   Occupational History  . disabled Unemployed    03/2010 for peripheral neuropathy  . home daycare     x 20 yrs.   Social History Main Topics  . Smoking status: Never Smoker  . Smokeless tobacco: Never Used  . Alcohol use No  . Drug use: No  . Sexual activity: No   Other Topics Concern  . Not on file   Social History Narrative   Marital status:Married x 44 yrs. Happily married, no abuse.       Children:  2 children (daughter 9, son 89). Two grandsons and 2 step grandchildren.       Lives: with husband, daughter, son-in-law, 2 grandsons.      Employment: disability for peripheral neuropathy 2012.  Previously had home daycare.      Tobacco: none      Alcohol: none      Drugs: none  Exercise: none. [employer].      Pets: dog.      Always uses seat belts, smoke detectors in home.      No guns in the home.       Caffeine use: 2 cups coffee per day.       Nutrition: Well balanced diet.   Family History  Problem Relation Age of Onset  . Diabetes Mother   . Hypertension Mother   . Arthritis Mother        knees, lumbar DDD, cervical DDD  . Cancer Father        prostate,skin,lymphoma.  . Obesity Brother   . Diabetes Maternal Grandmother   . Heart disease Maternal Grandmother   . COPD Maternal Grandmother   . Diabetes Paternal Grandmother   . Diabetes Son   . Hypertension Son   . Cancer Maternal Grandfather   . Diabetes Paternal Grandfather        Objective:    BP 107/64   Pulse 60   Temp 97.4 F (36.3 C) (Oral)   Resp 18   Ht 5' 8.5" (1.74 m) Comment: with shoes  Wt 283 lb 3.2 oz (128.5 kg) Comment: with shoes  SpO2  98%   BMI 42.43 kg/m  Physical Exam  Constitutional: She is oriented to person, place, and time. She appears well-developed and well-nourished. No distress.  HENT:  Head: Normocephalic and atraumatic.  Right Ear: External ear normal.  Left Ear: External ear normal.  Nose: Nose normal.  Mouth/Throat: Oropharynx is clear and moist.  Eyes: Conjunctivae and EOM are normal. Pupils are equal, round, and reactive to light.  Neck: Normal range of motion. Neck supple. Carotid bruit is not present. No thyromegaly present.  Cardiovascular: Normal rate, regular rhythm, normal heart sounds and intact distal pulses.  Exam reveals no gallop and no friction rub.   No murmur heard. Pulmonary/Chest: Effort normal and breath sounds normal. She has no wheezes. She has no rales.  Abdominal: Soft. Bowel sounds are normal. She exhibits no distension and no mass. There is no tenderness. There is no rebound and no guarding.  Lymphadenopathy:    She has no cervical adenopathy.  Neurological: She is alert and oriented to person, place, and time. No cranial nerve deficit.  Skin: Skin is warm and dry. No rash noted. She is not diaphoretic. No erythema. No pallor.  Psychiatric: She has a normal mood and affect. Her behavior is normal.        Assessment & Plan:   1. Diabetic peripheral neuropathy associated with type 2 diabetes mellitus (HCC)   2. CAD in native artery   3. Chronic left ventricular systolic dysfunction   4. Essential hypertension, benign   5. OSA (obstructive sleep apnea)   6. Chronic renal impairment, stage 2 (mild)   7. History of CVA (cerebrovascular accident)   8. Other iron deficiency anemia   9. Pure hypercholesterolemia    -moderately controlled chronic medical conditions; obtain labs; no changes to management at this time. -non-compliant with Novolog and resistant to multiple injections during the day.  Orders Placed This Encounter  Procedures  . CBC with Differential/Platelet  .  Comprehensive metabolic panel    Order Specific Question:   Has the patient fasted?    Answer:   Yes  . Hemoglobin A1c  . Lipid panel    Order Specific Question:   Has the patient fasted?    Answer:   Yes   No orders of the defined  types were placed in this encounter.   Return in about 3 months (around 05/28/2017) for recheck diabetes.   Glenora Morocho Paulita Fujita, M.D. Primary Care at [facility] previously [facility] [address] [place], [place]  [address] [phone] phone [phone] fax

## 2017-02-25 NOTE — Patient Instructions (Signed)
     IF you received an x-ray today, you will receive an invoice from [facility]. Please contact [facility] at [phone] with questions or concerns regarding your invoice.   IF you received labwork today, you will receive an invoice from [facility]. Please contact [facility] at [phone] with questions or concerns regarding your invoice.   Our billing staff will not be able to assist you with questions regarding bills from these companies.  You will be contacted with the lab results as soon as they are available. The fastest way to get your results is to activate your My Chart account. Instructions are located on the last page of this paperwork. If you have not heard from us regarding the results in 2 weeks, please contact this office.     

## 2017-02-26 ENCOUNTER — Ambulatory Visit (INDEPENDENT_AMBULATORY_CARE_PROVIDER_SITE_OTHER): Payer: PPO | Admitting: Neurology

## 2017-02-26 ENCOUNTER — Encounter: Payer: Self-pay | Admitting: Neurology

## 2017-02-26 VITALS — BP 104/70 | HR 62 | Ht 68.0 in | Wt 285.6 lb

## 2017-02-26 DIAGNOSIS — I639 Cerebral infarction, unspecified: Secondary | ICD-10-CM

## 2017-02-26 DIAGNOSIS — E0842 Diabetes mellitus due to underlying condition with diabetic polyneuropathy: Secondary | ICD-10-CM

## 2017-02-26 LAB — CBC WITH DIFFERENTIAL/PLATELET
BASOS: 0 %
Basophils Absolute: 0 10*3/uL (ref 0.0–0.2)
EOS (ABSOLUTE): 0.2 10*3/uL (ref 0.0–0.4)
Eos: 2 %
HEMATOCRIT: 38 % (ref 34.0–46.6)
Hemoglobin: 12.7 g/dL (ref 11.1–15.9)
IMMATURE GRANULOCYTES: 0 %
Immature Grans (Abs): 0 10*3/uL (ref 0.0–0.1)
LYMPHS ABS: 1.8 10*3/uL (ref 0.7–3.1)
LYMPHS: 22 %
MCH: 29.9 pg (ref 26.6–33.0)
MCHC: 33.4 g/dL (ref 31.5–35.7)
MCV: 89 fL (ref 79–97)
Monocytes Absolute: 0.5 10*3/uL (ref 0.1–0.9)
Monocytes: 6 %
Neutrophils Absolute: 6 10*3/uL (ref 1.4–7.0)
Neutrophils: 70 %
PLATELETS: 197 10*3/uL (ref 150–379)
RBC: 4.25 x10E6/uL (ref 3.77–5.28)
RDW: 13.6 % (ref 12.3–15.4)
WBC: 8.5 10*3/uL (ref 3.4–10.8)

## 2017-02-26 LAB — COMPREHENSIVE METABOLIC PANEL
A/G RATIO: 1.5 (ref 1.2–2.2)
ALK PHOS: 107 IU/L (ref 39–117)
ALT: 10 IU/L (ref 0–32)
AST: 13 IU/L (ref 0–40)
Albumin: 4.3 g/dL (ref 3.6–4.8)
BILIRUBIN TOTAL: 0.4 mg/dL (ref 0.0–1.2)
BUN/Creatinine Ratio: 20 (ref 12–28)
BUN: 34 mg/dL — ABNORMAL HIGH (ref 8–27)
CALCIUM: 9.7 mg/dL (ref 8.7–10.3)
CO2: 29 mmol/L (ref 18–29)
Chloride: 98 mmol/L (ref 96–106)
Creatinine, Ser: 1.71 mg/dL — ABNORMAL HIGH (ref 0.57–1.00)
GFR calc Af Amer: 36 mL/min/{1.73_m2} — ABNORMAL LOW (ref >59)
GFR, EST NON AFRICAN AMERICAN: 31 mL/min/{1.73_m2} — AB (ref >59)
GLOBULIN, TOTAL: 2.9 g/dL (ref 1.5–4.5)
Glucose: 202 mg/dL — ABNORMAL HIGH (ref 65–99)
POTASSIUM: 5.3 mmol/L — AB (ref 3.5–5.2)
SODIUM: 144 mmol/L (ref 134–144)
Total Protein: 7.2 g/dL (ref 6.0–8.5)

## 2017-02-26 LAB — LIPID PANEL
CHOLESTEROL TOTAL: 116 mg/dL (ref 100–199)
Chol/HDL Ratio: 3.6 ratio (ref 0.0–4.4)
HDL: 32 mg/dL — ABNORMAL LOW (ref >39)
LDL Calculated: 49 mg/dL (ref 0–99)
Triglycerides: 175 mg/dL — ABNORMAL HIGH (ref 0–149)
VLDL CHOLESTEROL CAL: 35 mg/dL (ref 5–40)

## 2017-02-26 LAB — HEMOGLOBIN A1C
ESTIMATED AVERAGE GLUCOSE: 171 mg/dL
HEMOGLOBIN A1C: 7.6 % — AB (ref 4.8–5.6)

## 2017-02-26 NOTE — Patient Instructions (Addendum)
Encouraged to follow diabetes diet  Return to clinic in 9 months

## 2017-02-26 NOTE — Progress Notes (Signed)
Follow-up Visit   Date: 02/26/17    [name] MRN: [id number] DOB: [birth year]   Interim History: [name] is a 61 y.o. right-handed Caucasian female with diabetes mellitus type 2 since age of 74 complicated by neuropathy, left leg diabetic amyotrophy (2011), and CHF s/p ICD returning to the clinic for follow-up of stroke and neuropathy. The patient was accompanied to the clinic by husband who also provides collateral information.    History of present illness: Patient has a history of diabetic amyotrophy involving the left lower extremity 2011 (HbA1c 15, + 130lb weight loss in 1 year). She was under the care of Dr. Sandria Manly at [facility]. Slowly, she developed left leg weakness, falls, and numbness. Imaging showed multilevel spondylosis as well as postsurgical L4-5 and L5-S1 left hemilaminectomy and discectomy. Electrodiagnostic showed profound denervation involving the vastus medialis and iliopsoas muscles in addition to sensory abnormalities, suggesting diabetic amyotrophy. She underwent treatment with IVIG and was ultimately discharged to rehabilitation facility. Over the next 8 months, she continued to do rehabilitation and was eventually able to walk independently again, but using a walker as needed.  Patient was reportedly doing well until February 2016 when she slowly started developing painless left foot drop and bilateral proximal leg weakness. Prior to February, she was walking independently, but now is dependent on a walk.  She cannot make transfers independently. She endorses difficulty with climbing stairs and walking.  In mid-April, she developed weakness of the left last two fingers, where she is unable to extend the 4th and 5th digits on the left. She has difficulty gripping things with her left hand.   UPDATE 06/15/2015:  She has underwent a battery of tests since her last visit including serum, CSF testing, and EMGs.  There was evidence of  sensorimotor neuropathy as well as polyradiculoneuropathy.  CSF testing showed slightly elevated protein, otherwise was normal.  More notable was that her inflammatory markers, ANA, and p-ANCA returned elevated.  She saw rheumatology who ordered additional testing, but patient is unaware of these results and did not have a return visit.  She has been undergoing hyperbaric oxygen therapy for her wound which has helped left heel ulcer, but now she has right toe ulcer.  The strength of her legs has significantly improved and over the past month, she has been walking independently.  Her left hand remains contracted which limits her ability to use it. She has been driving since October and able to control the pedal without difficulty.  She had residual left foot drop from 2011, but overall feels back to herself.    UPDATE 11/24/2015:  Patient had in interval hospitalization for stroke manifesting with left leg>arm/face numbness and weakness (NIHSS 2) on 2/4.  CT showed a small right centrum semiovale infarct.  Intracranial imaging was not performed and her echo was suboptimal.  US carotids showed <50% stenosis bilaterally.  Medical management was optimized by adding plavix 75mg  to aspirin 81mg  which she was already taking.  Statin therapy was added by her PCP.  She no longer has numbness of the face and arm, but has mild residual numbness of the left leg and foot.  She is also dragging the left foot more than before.  She is doing PT and has an appointment today to be fit for AFO.  UPDATE 08/23/2016:  No new complaints today and has residual left leg weakness which has been stable.  Since discontinuing aspirin, her bruises have significantly improved.  She  is now only on plavix 75mg  for stroke secondary prevention.  She has been compliant about using her AFO and sustained one fall.  No interval falls, neurological events, or hospitalizations.  UPDATE 02/26/2017:  She is here for 6 month follow-up visit.  She has no  new complaints today and is doing well.  She is complaint with her medications.  Her labs show improved LDL at 49 and elevated HbA1c 7.6 and is working with her PCP to make changes to her insulin. She has increased about 25lb over the past year.  She admits to having a sweet tooth as well as drinking orange juice and sweet tea. No interval hospitalization, falls, or illnesses.  Her husband is having abdominal aneurysm repair in August.    Medications:  Current Outpatient Prescriptions on File Prior to Visit  Medication Sig Dispense Refill  . AMBULATORY NON FORMULARY MEDICATION 1 Units by Other route once. Medication Name: foot brace 1 Units 0  . B-D ULTRAFINE III SHORT PEN 31G X 8 MM MISC USE AS DIRECTED WITH LANTUS 100 each 3  . Calcium Carb-Cholecalciferol (CALCIUM 600 + D PO) Take 1 tablet by mouth 2 (two) times daily.    . carvedilol (COREG) 3.125 MG tablet TAKE 1 TABLET BY MOUTH TWICE A DAY 60 tablet 3  . citalopram (CELEXA) 20 MG tablet TAKE 1 TABLET BY MOUTH DAILY 90 tablet 0  . clopidogrel (PLAVIX) 75 MG tablet TAKE 1 TABLET BY MOUTH ONCE A DAY 90 tablet 1  . gabapentin (NEURONTIN) 600 MG tablet Take 1 tablet (600 mg total) by mouth 3 (three) times daily. 270 tablet 3  . glimepiride (AMARYL) 4 MG tablet TAKE 1 TABLET BY MOUTH TWICE A DAY BEFORE A MEAL. 180 tablet 3  . glucose blood (ONE TOUCH ULTRA TEST) test strip Use to check blood sugar three times a day 100 each 0  . Insulin Glargine (LANTUS) 100 UNIT/ML Solostar Pen Inject 18 Units into the skin at bedtime. 15 mL 11  . isosorbide mononitrate (IMDUR) 30 MG 24 hr tablet TAKE 1 TABLET BY MOUTH ONCE DAILY 30 tablet 5  . MULTIPLE VITAMIN PO Take 1 tablet by mouth every morning.     . mupirocin ointment (BACTROBAN) 2 % Apply to affected area 3 times daily 22 g 0  . nitroGLYCERIN (NITROSTAT) 0.4 MG SL tablet Place under the tongue. Reported on 04/16/2016    . Omega-3 Fatty Acids (FISH OIL) 1000 MG CAPS Take 1,000 mg by mouth 2 (two) times  daily.     Marland Kitchen OVER THE COUNTER MEDICATION     . rosuvastatin (CRESTOR) 40 MG tablet Take 1 tablet (40 mg total) by mouth daily. 90 tablet 3  . torsemide (DEMADEX) 20 MG tablet Take 1 tablet (20 mg total) by mouth daily. 30 tablet 3  . traMADol (ULTRAM) 50 MG tablet TAKE 1 TABLET EVERY 6 HOURS AS NEEDED FOR PAIN 120 tablet 5   No current facility-administered medications on file prior to visit.     Allergies: No Known Allergies  Review of Systems:  CONSTITUTIONAL: No fevers, chills, night sweats, or weight loss.  EYES: No visual changes or eye pain ENT: No hearing changes.  No history of nose bleeds.   RESPIRATORY: No cough, wheezing and shortness of breath.   CARDIOVASCULAR: Negative for chest pain, and palpitations.   GI: Negative for abdominal discomfort, blood in stools or black stools.  No recent change in bowel habits.   GU:  No history of incontinence.  MUSCLOSKELETAL: +history of joint pain or swelling.  No myalgias.   SKIN: Negative for lesions, rash, and itching.   ENDOCRINE: Negative for cold or heat intolerance, polydipsia or goiter.   PSYCH:  No depression or anxiety symptoms.   NEURO: As Above.   Vital Signs:  BP 104/70   Pulse 62   Ht 5\' 8"  (1.727 m)   Wt 285 lb 9 oz (129.5 kg)   SpO2 96%   BMI 43.42 kg/m   Neurological Exam: MENTAL STATUS including orientation to time, place, person, recent and remote memory, attention span and concentration, language, and fund of knowledge is normal.  .  CRANIAL NERVES:   Pupils are round and reactive. Face is symmetric.  Palate elevates symmetrically.   MOTOR:  Left TA atrophy (stable)    Right Upper Extremity:    Left Upper Extremity:    Deltoid  5/5   Deltoid  5/5   Biceps  5/5   Biceps  5/5   Triceps  5/5   Triceps  5/5   Wrist extensors  5/5   Wrist extensors  5/5   Wrist flexors  5/5   Wrist flexors  5/5   Finger extensors  5/5   Finger extensors * Limited by flexion contracture of the 3-5th digits at the MCP and  PIP 4+/5   Finger flexors  5/5   Finger flexors  5/5   Dorsal interossei  5/5   Dorsal interossei  5/5   Abductor pollicis  5/5   Abductor pollicis  5/5   Tone (Ashworth scale)  0  Tone (Ashworth scale)  0   Right Lower Extremity:    Left Lower Extremity:    Hip flexors  5/5   Hip flexors  5/5   Hip extensors  5/5   Hip extensors  5/5   Knee flexors  5/5   Knee flexors  5/5   Knee extensors  5/5   Knee extensors  5/5   Dorsiflexors  5/5   Dorsiflexors  2/5   Plantarflexors  5/5   Plantarflexors  3/5   Toe extensors  5/5   Toe extensors  2/5   Toe flexors  5/5   Toe flexors  2/5   Tone (Ashworth scale)  0  Tone (Ashworth scale)  0   SENSORY: Vibration and temperature absent at ankles bilaterally, intact at knees.     COORDINATION/GAIT:  Gait assisted with left AFO and stable.   Data: MRI lumbar spine 05/17/2010: 1. Unchanged MRI from 03/22/2010. No epidural abscess is identified. No compression fracture or acute abnormality. 2. Postoperative changes on the left at L4-L5 and L5-S1. 3. Unchanged left paracentral and posterolateral shallow disc protrusion at L5-S1 potentially affecting the descending left S1 nerve root.  MRI brain wo contrast 05/17/2010: Small white matter lesions in the frontal lobes bilaterally. These are likely due to chronic microvascular ischemia. Demyelinating disease is not considered likely in this case. No acute abnormality.  EMG of the right lower extremity 03/28/2015: The electrophysiologic findings are most consistent with a severe generalized sensorimotor polyneuropathy, axon loss in type, affecting the right lower extremity. A superimposed multilevel intraspinal canal lesion (i.e. radiculopathy) cannot be excluded.  EMG of the upper extremities 03/21/2015:  The electrophysiologic findings are most consistent with a severe subacute sensorimotor polyradiculoneuropathy affecting the upper extremities  Labs 01/22/2015:  SPEP/UPEP with IFE no M protein, ANA  1:640* homogenous pattern, P-ANCA 1:640*, RF < 10, ACE 46, copper 131, ceruloplasmin 31, ESR 65, vitamin  B1 17, CK 24, ENA negative, CRP 15.2*, cryoglobulins none detected, aldolase 8.0  CSF 04/28/2015:  W1  R0  G73  P63*   Cryptococcus neg, ACE 11, one OCB in CSF and serum, MBP <2.0, Lyme neg, IgG index 0.45  Lab Results  Component Value Date   HGBA1C 7.6 (H) 02/25/2017   US carotids 11/24/2015: Mild atherosclerotic disease in the carotid arteries bilaterally. Estimated degree of stenosis in the internal carotid arteries is less than 50% bilaterally. Patent vertebral arteries.  Left thyroid nodule measuring up to 2.2 cm. This nodule meets criteria for biopsy. Recommend dedicated ultrasound of the thyroid to look for additional nodules and consider biopsy of the dominant left thyroid nodule.   CT head wo contrast 11/11/2015: Small rounded focal low density seen in right centrum semiovale concerning for focal infarction of indeterminate age. These results were called by telephone at the time of interpretation on 11/11/2015 at 9:49 am to Dr. Jene Every , who verbally acknowledged these results.  Lab Results  Component Value Date   CHOL 116 02/25/2017   HDL 32 (L) 02/25/2017   LDLCALC 49 02/25/2017   TRIG 175 (H) 02/25/2017   CHOLHDL 3.6 02/25/2017    IMPRESSION/PLAN: 1.  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  Left foot drop due to known history of stroke and diabetic amyotrophy, clinically stable  - Continue AFO   Return to clinic in 9 months  The duration of this appointment visit was 20 minutes of face-to-face time with the patient.  Greater than 50% of this time was spent in counseling, explanation of diagnosis, planning of further management, and coordination of care.   Thank you for allowing me to participate in patient's care.  If I can answer any additional questions, I would be pleased to do so.    Sincerely,    Muranda Coye K. Allena Katz, DO

## 2017-03-06 DIAGNOSIS — H2512 Age-related nuclear cataract, left eye: Secondary | ICD-10-CM | POA: Diagnosis not present

## 2017-03-06 DIAGNOSIS — Z961 Presence of intraocular lens: Secondary | ICD-10-CM | POA: Diagnosis not present

## 2017-03-06 DIAGNOSIS — H2511 Age-related nuclear cataract, right eye: Secondary | ICD-10-CM | POA: Diagnosis not present

## 2017-03-11 ENCOUNTER — Ambulatory Visit
Admission: RE | Admit: 2017-03-11 | Discharge: 2017-03-11 | Disposition: A | Discharge status: Home / Self Care | Payer: PPO | Source: Ambulatory Visit | Attending: Family Medicine | Admitting: Family Medicine

## 2017-03-11 DIAGNOSIS — Z1231 Encounter for screening mammogram for malignant neoplasm of breast: Secondary | ICD-10-CM

## 2017-03-26 ENCOUNTER — Other Ambulatory Visit: Payer: Self-pay | Admitting: Family Medicine

## 2017-04-01 ENCOUNTER — Other Ambulatory Visit: Payer: Self-pay | Admitting: Cardiovascular Disease

## 2017-04-01 DIAGNOSIS — I5021 Acute systolic (congestive) heart failure: Secondary | ICD-10-CM

## 2017-04-22 ENCOUNTER — Ambulatory Visit (INDEPENDENT_AMBULATORY_CARE_PROVIDER_SITE_OTHER): Payer: PPO | Admitting: *Deleted

## 2017-04-22 DIAGNOSIS — I420 Dilated cardiomyopathy: Secondary | ICD-10-CM | POA: Diagnosis not present

## 2017-04-22 DIAGNOSIS — I5022 Chronic systolic (congestive) heart failure: Secondary | ICD-10-CM

## 2017-04-22 NOTE — Progress Notes (Signed)
Remote ICD transmission.   

## 2017-04-23 ENCOUNTER — Encounter: Payer: Self-pay | Admitting: Cardiology

## 2017-04-24 LAB — CUP PACEART REMOTE DEVICE CHECK
Brady Statistic AP VP Percent: 29.14 %
Brady Statistic AP VS Percent: 0.02 %
Brady Statistic AS VP Percent: 70.82 %
Date Time Interrogation Session: 20180717062825
HighPow Impedance: 67 Ohm
Implantable Lead Implant Date: 20150930
Implantable Lead Implant Date: 20150930
Implantable Lead Implant Date: 20150930
Implantable Lead Location: 753857
Implantable Lead Location: 753860
Implantable Lead Model: 5076
Implantable Pulse Generator Implant Date: 20150930
Lead Channel Impedance Value: 342 Ohm
Lead Channel Impedance Value: 342 Ohm
Lead Channel Impedance Value: 342 Ohm
Lead Channel Impedance Value: 399 Ohm
Lead Channel Impedance Value: 627 Ohm
Lead Channel Impedance Value: 627 Ohm
Lead Channel Impedance Value: 646 Ohm
Lead Channel Pacing Threshold Amplitude: 0.5 V
Lead Channel Pacing Threshold Amplitude: 0.75 V
Lead Channel Pacing Threshold Pulse Width: 0.4 ms
Lead Channel Pacing Threshold Pulse Width: 0.4 ms
Lead Channel Pacing Threshold Pulse Width: 0.4 ms
Lead Channel Sensing Intrinsic Amplitude: 2.125 mV
Lead Channel Sensing Intrinsic Amplitude: 2.125 mV
Lead Channel Setting Pacing Amplitude: 1.5 V
Lead Channel Setting Pacing Pulse Width: 0.4 ms
Lead Channel Setting Sensing Sensitivity: 0.3 mV
MDC IDC LEAD LOCATION: 753859
MDC IDC MSMT BATTERY REMAINING LONGEVITY: 63 mo
MDC IDC MSMT BATTERY VOLTAGE: 2.98 V
MDC IDC MSMT LEADCHNL LV IMPEDANCE VALUE: 380 Ohm
MDC IDC MSMT LEADCHNL LV IMPEDANCE VALUE: 437 Ohm
MDC IDC MSMT LEADCHNL LV IMPEDANCE VALUE: 570 Ohm
MDC IDC MSMT LEADCHNL LV IMPEDANCE VALUE: 589 Ohm
MDC IDC MSMT LEADCHNL LV PACING THRESHOLD AMPLITUDE: 0.75 V
MDC IDC MSMT LEADCHNL RV IMPEDANCE VALUE: 380 Ohm
MDC IDC MSMT LEADCHNL RV IMPEDANCE VALUE: 456 Ohm
MDC IDC MSMT LEADCHNL RV SENSING INTR AMPL: 31 mV
MDC IDC MSMT LEADCHNL RV SENSING INTR AMPL: 31 mV
MDC IDC SET LEADCHNL LV PACING AMPLITUDE: 2 V
MDC IDC SET LEADCHNL RV PACING AMPLITUDE: 2 V
MDC IDC SET LEADCHNL RV PACING PULSEWIDTH: 0.4 ms
MDC IDC STAT BRADY AS VS PERCENT: 0.03 %
MDC IDC STAT BRADY RA PERCENT PACED: 29.07 %
MDC IDC STAT BRADY RV PERCENT PACED: 99.66 %

## 2017-05-07 ENCOUNTER — Encounter: Payer: Self-pay | Admitting: Cardiology

## 2017-05-20 ENCOUNTER — Other Ambulatory Visit: Payer: Self-pay | Admitting: Internal Medicine

## 2017-05-20 NOTE — Telephone Encounter (Signed)
This is a [place] pt 

## 2017-05-26 ENCOUNTER — Encounter: Payer: Self-pay | Admitting: *Deleted

## 2017-05-28 ENCOUNTER — Encounter: Payer: Self-pay | Admitting: Family Medicine

## 2017-05-28 ENCOUNTER — Ambulatory Visit (INDEPENDENT_AMBULATORY_CARE_PROVIDER_SITE_OTHER): Payer: PPO | Admitting: Family Medicine

## 2017-05-28 VITALS — BP 119/72 | HR 71 | Temp 97.6°F | Resp 16 | Ht 67.72 in | Wt 287.0 lb

## 2017-05-28 DIAGNOSIS — E7800 Pure hypercholesterolemia, unspecified: Secondary | ICD-10-CM

## 2017-05-28 DIAGNOSIS — N182 Chronic kidney disease, stage 2 (mild): Secondary | ICD-10-CM | POA: Diagnosis not present

## 2017-05-28 DIAGNOSIS — I2510 Atherosclerotic heart disease of native coronary artery without angina pectoris: Secondary | ICD-10-CM

## 2017-05-28 DIAGNOSIS — Z8673 Personal history of transient ischemic attack (TIA), and cerebral infarction without residual deficits: Secondary | ICD-10-CM

## 2017-05-28 DIAGNOSIS — G4733 Obstructive sleep apnea (adult) (pediatric): Secondary | ICD-10-CM | POA: Diagnosis not present

## 2017-05-28 DIAGNOSIS — I10 Essential (primary) hypertension: Secondary | ICD-10-CM

## 2017-05-28 DIAGNOSIS — R159 Full incontinence of feces: Secondary | ICD-10-CM

## 2017-05-28 DIAGNOSIS — E1142 Type 2 diabetes mellitus with diabetic polyneuropathy: Secondary | ICD-10-CM | POA: Diagnosis not present

## 2017-05-28 LAB — GLUCOSE, POCT (MANUAL RESULT ENTRY): POC GLUCOSE: 372 mg/dL — AB (ref 70–99)

## 2017-05-28 LAB — POCT GLYCOSYLATED HEMOGLOBIN (HGB A1C): HEMOGLOBIN A1C: 7.9

## 2017-05-28 MED ORDER — INSULIN GLARGINE 100 UNIT/ML SOLOSTAR PEN: 22.0000 [IU] | PEN_INJECTOR | Freq: Every day | SUBCUTANEOUS | 11 refills | Status: DC

## 2017-05-28 NOTE — Patient Instructions (Addendum)
INCREASE LANTUS TO 22 UNITS EVERY  NIGHT.   ---I recommend weight loss, exercise, and low-carbohydrate low-sugar food choices. You should AVOID: regular sodas, sweetened tea, fruit juices.    --You should LIMIT: breads, pastas, rice, potatoes, and desserts/sweets.  I would recommend limiting your total carbohydrate intake per meal to 45 grams; I would limit your total carbohydrate intake per snack to 30 grams.   -- I would also have a goal of 60 grams of protein intake per day; this would equal 10-15 grams of protein per meal and 5-10 grams of protein per snack.   IF you received an x-ray today, you will receive an invoice from [facility]. Please contact [facility] at [phone] with questions or concerns regarding your invoice.   IF you received labwork today, you will receive an invoice from [place]. Please contact [facility] at [phone] with questions or concerns regarding your invoice.   Our billing staff will not be able to assist you with questions regarding bills from these companies.  You will be contacted with the lab results as soon as they are available. The fastest way to get your results is to activate your My Chart account. Instructions are located on the last page of this paperwork. If you have not heard from [place] regarding the results in 2 weeks, please contact this office.

## 2017-05-28 NOTE — Progress Notes (Signed)
Subjective:    Patient ID: [name], female    DOB: September 20, 1956, 61 y.o.   MRN: [id number]  05/28/2017  Diabetes (3 month follow-up) and Hypertension   HPI This 61 y.o. female presents for three month follow-up of DMII with severe PN, CAD, hypercholesterolemia, anxiety/depression.  Management changes made at last visit included changing Glimeperide to two every morning due to noncompliance with evening dose.  Glimeperide 4mg  two every morning. Lantus 18-20 units every night.  Will decrease Lantus dose of sugar in evening is low. Very fearful of hypoglycemia.  Lowest sugar 68; usually 110-140 every morning. Evening sugars running 100-400.    Breakfast:  cereal, strawberries, milk.   Lunch:L two bacon lettuce, tomato sandwiches. Eats excessive sweets; mother loves to bake; loves sweet tea and orange juice as well.   Husband underwent AAA repair in past three months at [facility]; doing well.   Evaluated by [name] of neurology for R centrum semiovale manisfesting with LEFT hemisensory changes in February 2017 and to continue Plavix therapy.  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  LEFT foot drop due to history of CVA and diabetic amyotrophy.    Patient reported at very end of visit of suffering with fecal incontinence intermittently for the past six months; did not discus with neurology at recent visit in 02/2017.  Occurs once per week on average.  Has suffered with nighttime incontinence once.  Will also leak feces intermittently.  Tends to occur for three days and then resolve and then recur.  Denies any constipation or bloody stools. Denies saddle paresthesias or worsening paresthesias and/or weakness of extremities.  No previous colonoscopy and refuses referral to GI.    BP Readings from Last 3 Encounters:  05/28/17 119/72  02/26/17 104/70  02/25/17 107/64   Wt Readings from Last 3 Encounters:  05/28/17 287 lb (130.2  kg)  02/26/17 285 lb 9 oz (129.5 kg)  02/25/17 283 lb 3.2 oz (128.5 kg)   Immunization History  Administered Date(s) Administered  . Hepatitis B 03/16/2013  . Hepatitis B, adult 03/30/2014, 07/18/2014  . Influenza Split 07/21/2012  . Influenza,inj,Quad PF,6+ Mos 06/16/2013, 07/07/2014, 06/18/2015, 08/06/2016  . Pneumococcal Polysaccharide-23 08/08/2011, 06/18/2015, 08/13/2016  . Tdap 03/07/2010, 05/03/2016  . Zoster 04/06/2014    Review of Systems  Constitutional: Negative for chills, diaphoresis, fatigue and fever.  Eyes: Negative for visual disturbance.  Respiratory: Negative for cough and shortness of breath.   Cardiovascular: Negative for chest pain, palpitations and leg swelling.  Gastrointestinal: Negative for abdominal distention, abdominal pain, anal bleeding, blood in stool, constipation, diarrhea, nausea, rectal pain and vomiting.  Endocrine: Negative for cold intolerance, heat intolerance, polydipsia, polyphagia and polyuria.  Musculoskeletal: Positive for gait problem. Negative for arthralgias, back pain and joint swelling.  Neurological: Negative for dizziness, tremors, seizures, syncope, facial asymmetry, speech difficulty, weakness, light-headedness, numbness and headaches.  Psychiatric/Behavioral: Positive for dysphoric mood. The patient is nervous/anxious.     Past Medical History:  Diagnosis Date  . Acute myocardial infarction, subendocardial infarction, initial episode of care (HCC) 07/21/2012  . Acute osteomyelitis involving ankle and foot (HCC) 02/06/2015  . Acute systolic heart failure (HCC) 07/21/2012   New onset 07/19/12; admission to [facility] ED. Elevated Troponins.  S/p 2D-echo with EF 20-25%.  S/p cardiac catheterization with stenting LAD.  Repeat 2D-echo 10/2011 with improved EF of 35%.   . Anemia   . Automatic implantable cardioverter-defibrillator in situ    a. MDT CRT-D  06/2014, SN: [id number]    . CAD (coronary artery disease)    a. cardiac cath  101/04/2012: PCI/DES to chronically occluded mLAD, consideration PCI to diag branch in 4 weeks.   . Cataract   . Chicken pox   . Chronic systolic CHF (congestive heart failure) (HCC)    a. mixed ICM & NICM; b. EF 20-25% by echo 07/2012, mid-dist 2/3 of LV sev HK/AK, mild MR. echo 10/2012: EF 30-35%, sev HK ant-septal & inf walls, GR1DD, mild MR, PASP 33. c. echo 02/2013: EF 30%, GR1DD, mild MR. echo 04/2014: EF 30%, Septal-lat dyssynchrony, global HK, inf AK, GR1DD, mild MR. d. echo 10/2014: EF50-55%, WM nl, GR1DD, septal mild paradox. e. echo 02/2015: EF 50-55%, wm   . Depression   . Heart attack (HCC)   . Heel ulcer (HCC) 04/27/2015  . History of blood transfusion ~ 2011   "plasma; had neuropathy; couldn't walk"  . Hypertension   . LBBB (left bundle branch block)   . Neuropathy 2011  . Obesity, unspecified   . OSA on CPAP    Moderate with AHI 23/hr and now on CPAP at 16cm H2O.  Her DME is [facility]  . Pure hypercholesterolemia   . Type II diabetes mellitus (HCC)   . Unspecified vitamin D deficiency    Past Surgical History:  Procedure Laterality Date  . AMPUTATION TOE Right 06/18/2015   Procedure: AMPUTATION TOE;  Surgeon: Gwyneth Revels, DPM;  Location: [facility] ORS;  Service: Podiatry;  Laterality: Right;  . BACK SURGERY    . BI-VENTRICULAR IMPLANTABLE CARDIOVERTER DEFIBRILLATOR N/A 07/06/2014   Procedure: BI-VENTRICULAR IMPLANTABLE CARDIOVERTER DEFIBRILLATOR  (CRT-D);  Surgeon: Duke Salvia, MD;  Location: [facility] CATH LAB;  Service: Cardiovascular;  Laterality: N/A;  . BI-VENTRICULAR IMPLANTABLE CARDIOVERTER DEFIBRILLATOR  (CRT-D)  07/06/2014  . BILATERAL OOPHORECTOMY  01/2011   ovarian cyst benign  . COLONOSCOPY WITH PROPOFOL Left 02/22/2015   Procedure: COLONOSCOPY WITH PROPOFOL;  Surgeon: Wallace Cullens, MD;  Location: [facility] ENDOSCOPY;  Service: Endoscopy;  Laterality: Left;  . CORONARY ANGIOPLASTY WITH STENT PLACEMENT Left 07/2012   new onset systolic CHF; elevated troponins.  Cardiac catheterization  with stenting to LAD; EF 15%.  2D-echo: EF 20-25%.  . ESOPHAGOGASTRODUODENOSCOPY N/A 02/22/2015   Procedure: ESOPHAGOGASTRODUODENOSCOPY (EGD);  Surgeon: Wallace Cullens, MD;  Location: [facility] ENDOSCOPY;  Service: Endoscopy;  Laterality: N/A;  . INCISION AND DRAINAGE ABSCESS Right 2007   groin; with ICU stay due to sepsis.  Marland Kitchen LAPAROSCOPIC CHOLECYSTECTOMY  2011  . LEFT HEART CATHETERIZATION WITH CORONARY ANGIOGRAM N/A 07/21/2012   Procedure: LEFT HEART CATHETERIZATION WITH CORONARY ANGIOGRAM;  Surgeon: Dolores Patty, MD;  Location: [facility] CATH LAB;  Service: Cardiovascular;  Laterality: N/A;  . LUMBAR DISC SURGERY  1996   L4-5  . PERCUTANEOUS CORONARY STENT INTERVENTION (PCI-S) N/A 07/23/2012   Procedure: PERCUTANEOUS CORONARY STENT INTERVENTION (PCI-S);  Surgeon: Tonny Bollman, MD;  Location: [facility] CATH LAB;  Service: Cardiovascular;  Laterality: N/A;  . PERIPHERAL VASCULAR CATHETERIZATION N/A 02/10/2015   Procedure: Picc Line Insertion;  Surgeon: Renford Dills, MD;  Location: [facility] INVASIVE CV LAB;  Service: Cardiovascular;  Laterality: N/A;  . TUBAL LIGATION  1981  . VAGINAL HYSTERECTOMY  01/2011   Fibroids/DUB.  Ovaries removed. [place].   No Known Allergies Current Outpatient Prescriptions  Medication Sig Dispense Refill  . AMBULATORY NON FORMULARY MEDICATION 1 Units by Other route once. Medication Name: foot brace 1 Units 0  . B-D ULTRAFINE III SHORT PEN 31G X 8 MM MISC  USE AS DIRECTED WITH LANTUS 100 each 3  . Calcium Carb-Cholecalciferol (CALCIUM 600 + D PO) Take 1 tablet by mouth 2 (two) times daily.    . carvedilol (COREG) 3.125 MG tablet TAKE 1 TABLET BY MOUTH TWICE A DAY 60 tablet 3  . citalopram (CELEXA) 20 MG tablet TAKE 1 TABLET BY MOUTH DAILY 90 tablet 0  . clopidogrel (PLAVIX) 75 MG tablet TAKE 1 TABLET BY MOUTH ONCE A DAY 90 tablet 1  . gabapentin (NEURONTIN) 600 MG tablet Take 1 tablet (600 mg total) by mouth 3 (three) times daily. 270 tablet 3  . glimepiride (AMARYL) 4 MG tablet  TAKE 1 TABLET BY MOUTH TWICE A DAY BEFORE A MEAL. 180 tablet 3  . Insulin Glargine (LANTUS) 100 UNIT/ML Solostar Pen Inject 22 Units into the skin at bedtime. 15 mL 11  . isosorbide mononitrate (IMDUR) 30 MG 24 hr tablet TAKE 1 TABLET BY MOUTH ONCE DAILY 30 tablet 5  . MULTIPLE VITAMIN PO Take 1 tablet by mouth every morning.     . nitroGLYCERIN (NITROSTAT) 0.4 MG SL tablet Place under the tongue. Reported on 04/16/2016    . Omega-3 Fatty Acids (FISH OIL) 1000 MG CAPS Take 1,000 mg by mouth 2 (two) times daily.     . ONE TOUCH ULTRA TEST test strip USE TO CHECK BLOOD SUGAR 3 TIMES A DAY 300 each 6  . OVER THE COUNTER MEDICATION     . rosuvastatin (CRESTOR) 40 MG tablet Take 1 tablet (40 mg total) by mouth daily. 90 tablet 3  . torsemide (DEMADEX) 20 MG tablet TAKE 1 TABLET BY MOUTH DAILY 30 tablet 3  . traMADol (ULTRAM) 50 MG tablet TAKE 1 TABLET EVERY 6 HOURS AS NEEDED FOR PAIN 120 tablet 5   No current facility-administered medications for this visit.    Social History   Social History  . Marital status: Married    Spouse name: N/A  . Number of children: 2  . Years of education: 12   Occupational History  . disabled Unemployed    03/2010 for peripheral neuropathy  . home daycare     x 20 yrs.   Social History Main Topics  . Smoking status: Never Smoker  . Smokeless tobacco: Never Used  . Alcohol use No  . Drug use: No  . Sexual activity: No   Other Topics Concern  . Not on file   Social History Narrative   Marital status:Married x 44 yrs. Happily married, no abuse.       Children:  2 children (daughter 56, son 80). Two grandsons and 2 step grandchildren.       Lives: with husband, daughter, son-in-law, 2 grandsons.      Employment: disability for peripheral neuropathy 2012.  Previously had home daycare.      Tobacco: none      Alcohol: none      Drugs: none      Exercise: none. [employer].      Pets: dog.      Always uses seat belts, smoke detectors in home.        No guns in the home.       Caffeine use: 2 cups coffee per day.       Nutrition: Well balanced diet.   Family History  Problem Relation Age of Onset  . Diabetes Mother   . Hypertension Mother   . Arthritis Mother        knees, lumbar DDD, cervical DDD  .  Cancer Father        prostate,skin,lymphoma.  . Obesity Brother   . Diabetes Maternal Grandmother   . Heart disease Maternal Grandmother   . COPD Maternal Grandmother   . Diabetes Paternal Grandmother   . Diabetes Son   . Hypertension Son   . Cancer Maternal Grandfather   . Diabetes Paternal Grandfather        Objective:    BP 119/72   Pulse 71   Temp 97.6 F (36.4 C) (Oral)   Resp 16   Ht 5' 7.72" (1.72 m)   Wt 287 lb (130.2 kg)   SpO2 95%   BMI 44.00 kg/m  Physical Exam  Constitutional: She is oriented to person, place, and time. She appears well-developed and well-nourished. No distress.  HENT:  Head: Normocephalic and atraumatic.  Right Ear: External ear normal.  Left Ear: External ear normal.  Nose: Nose normal.  Mouth/Throat: Oropharynx is clear and moist.  Eyes: Pupils are equal, round, and reactive to light. Conjunctivae and EOM are normal.  Neck: Normal range of motion. Neck supple. Carotid bruit is not present. No thyromegaly present.  Cardiovascular: Normal rate, regular rhythm, normal heart sounds and intact distal pulses.  Exam reveals no gallop and no friction rub.   No murmur heard. Pulmonary/Chest: Effort normal and breath sounds normal. She has no wheezes. She has no rales.  Abdominal: Soft. Bowel sounds are normal. She exhibits no distension and no mass. There is no tenderness. There is no rebound and no guarding.  Musculoskeletal:  Well healed toe amputation.   Lymphadenopathy:    She has no cervical adenopathy.  Neurological: She is alert and oriented to person, place, and time. No cranial nerve deficit.  Skin: Skin is warm and dry. No rash noted. She is not diaphoretic. No erythema. No  pallor.  Psychiatric: She has a normal mood and affect. Her behavior is normal.   Results for orders placed or performed in visit on 05/28/17  CBC with Differential/Platelet  Result Value Ref Range   WBC 7.8 3.4 - 10.8 x10E3/uL   RBC 4.12 3.77 - 5.28 x10E6/uL   Hemoglobin 12.3 11.1 - 15.9 g/dL   Hematocrit 95.2 84.1 - 46.6 %   MCV 92 79 - 97 fL   MCH 29.9 26.6 - 33.0 pg   MCHC 32.5 31.5 - 35.7 g/dL   RDW 32.4 40.1 - 02.7 %   Platelets 188 150 - 379 x10E3/uL   Neutrophils 74 Not Estab. %   Lymphs 20 Not Estab. %   Monocytes 4 Not Estab. %   Eos 2 Not Estab. %   Basos 0 Not Estab. %   Neutrophils Absolute 5.8 1.4 - 7.0 x10E3/uL   Lymphocytes Absolute 1.6 0.7 - 3.1 x10E3/uL   Monocytes Absolute 0.3 0.1 - 0.9 x10E3/uL   EOS (ABSOLUTE) 0.1 0.0 - 0.4 x10E3/uL   Basophils Absolute 0.0 0.0 - 0.2 x10E3/uL   Immature Granulocytes 0 Not Estab. %   Immature Grans (Abs) 0.0 0.0 - 0.1 x10E3/uL  Comprehensive metabolic panel  Result Value Ref Range   Glucose 301 (H) 65 - 99 mg/dL   BUN 35 (H) 8 - 27 mg/dL   Creatinine, Ser 2.53 (H) 0.57 - 1.00 mg/dL   GFR calc non Af Amer 27 (L) >59 mL/min/1.73   GFR calc Af Amer 31 (L) >59 mL/min/1.73   BUN/Creatinine Ratio 18 12 - 28   Sodium 141 134 - 144 mmol/L   Potassium 5.4 (H) 3.5 - 5.2  mmol/L   Chloride 99 96 - 106 mmol/L   CO2 23 20 - 29 mmol/L   Calcium 9.2 8.7 - 10.3 mg/dL   Total Protein 6.8 6.0 - 8.5 g/dL   Albumin 4.1 3.6 - 4.8 g/dL   Globulin, Total 2.7 1.5 - 4.5 g/dL   Albumin/Globulin Ratio 1.5 1.2 - 2.2   Bilirubin Total 0.2 0.0 - 1.2 mg/dL   Alkaline Phosphatase 96 39 - 117 IU/L   AST 22 0 - 40 IU/L   ALT 13 0 - 32 IU/L  POCT glucose (manual entry)  Result Value Ref Range   POC Glucose 372 (A) 70 - 99 mg/dl  POCT glycosylated hemoglobin (Hb A1C)  Result Value Ref Range   Hemoglobin A1C 7.9    No results found. Depression screen [facility] 2/9 05/28/2017 02/25/2017 08/13/2016 08/06/2016 04/16/2016  Decreased Interest 0 1 0 0 0    Down, Depressed, Hopeless 0 1 1 0 0  PHQ - 2 Score 0 2 1 0 0  Altered sleeping - 0 - - -  Tired, decreased energy - 0 - - -  Change in appetite - 0 - - -  Feeling bad or failure about yourself  - 0 - - -  Trouble concentrating - 0 - - -  Moving slowly or fidgety/restless - 0 - - -  Suicidal thoughts - 0 - - -  PHQ-9 Score - 2 - - -  Some recent data might be hidden   Fall Risk  05/28/2017 02/26/2017 02/25/2017 08/23/2016 08/13/2016  Falls in the past year? No No No Yes Yes  Number falls in past yr: - - - 2 or more 2 or more  Injury with Fall? - - - No No  Comment - - - - -  Risk for fall due to : - - - Other (Comment) -  Follow up - - - Falls evaluation completed;Education provided;Falls prevention discussed -        Assessment & Plan:   1. Diabetic peripheral neuropathy associated with type 2 diabetes mellitus (HCC)   2. CAD in native artery   3. Essential hypertension, benign   4. OSA (obstructive sleep apnea)   5. Chronic renal impairment, stage 2 (mild)   6. Pure hypercholesterolemia   7. Full incontinence of feces    -uncontrolled DMII due to non-compliance with dietary restrictions.   -increase Lantus to 22 units qhs and advised to administer this dose every night regardless of glucose reading in evenings.  Has only suffered with one hypoglycemic episode in the past yet extremely fearful of recurrence. -continue Amaryl 4mg  two every morning.   I recommend weight loss, exercise, and low-carbohydrate low-sugar food choices. You should AVOID: regular sodas, sweetened tea, fruit juices.  You should LIMIT: breads, pastas, rice, potatoes, and desserts/sweets.  I would recommend limiting your total carbohydrate intake per meal to 45 grams; I would limit your total carbohydrate intake per snack to 30 grams.  I would also have a goal of 60 grams of protein intake per day; this would equal 10-15 grams of protein per meal and 5-10 grams of protein per snack. -reviewed dietary  modification in detail during visit. -recent onset of fecal incontinence.  Due to peripheral neuropathy and other neurological issues, very concerned of a neurological etiology; will forward note to neurology; I have also asked patient to email neurologist.  I also recommended referral to gastroenterology yet patient is refusing.  Pt denies constipation; consider treating with stool softener daily  to see if incontinence will improve.   -stable from a cardiac standpoint and asymptomatic.  Crestor started by Dr. Mariah Milling in 02/2017.  -continue Plavix due to history of CVA.   Orders Placed This Encounter  Procedures  . CBC with Differential/Platelet  . Comprehensive metabolic panel  . POCT glucose (manual entry)  . POCT glycosylated hemoglobin (Hb A1C)   Meds ordered this encounter  Medications  . Insulin Glargine (LANTUS) 100 UNIT/ML Solostar Pen    Sig: Inject 22 Units into the skin at bedtime.    Dispense:  15 mL    Refill:  11    Return in about 3 months (around 08/28/2017) for complete physical examiniation.   Etheridge Geil Paulita Fujita, M.D. Primary Care at [facility] [address] [place], [place]  [address] [phone] phone [phone] fax

## 2017-05-29 ENCOUNTER — Other Ambulatory Visit: Payer: Self-pay | Admitting: Family Medicine

## 2017-05-29 LAB — COMPREHENSIVE METABOLIC PANEL
A/G RATIO: 1.5 (ref 1.2–2.2)
ALBUMIN: 4.1 g/dL (ref 3.6–4.8)
ALK PHOS: 96 IU/L (ref 39–117)
ALT: 13 IU/L (ref 0–32)
AST: 22 IU/L (ref 0–40)
BILIRUBIN TOTAL: 0.2 mg/dL (ref 0.0–1.2)
BUN/Creatinine Ratio: 18 (ref 12–28)
BUN: 35 mg/dL — AB (ref 8–27)
CHLORIDE: 99 mmol/L (ref 96–106)
CO2: 23 mmol/L (ref 20–29)
Calcium: 9.2 mg/dL (ref 8.7–10.3)
Creatinine, Ser: 1.96 mg/dL — ABNORMAL HIGH (ref 0.57–1.00)
GFR calc Af Amer: 31 mL/min/{1.73_m2} — ABNORMAL LOW (ref >59)
GFR calc non Af Amer: 27 mL/min/{1.73_m2} — ABNORMAL LOW (ref >59)
Globulin, Total: 2.7 g/dL (ref 1.5–4.5)
Glucose: 301 mg/dL — ABNORMAL HIGH (ref 65–99)
POTASSIUM: 5.4 mmol/L — AB (ref 3.5–5.2)
Sodium: 141 mmol/L (ref 134–144)
TOTAL PROTEIN: 6.8 g/dL (ref 6.0–8.5)

## 2017-05-29 LAB — CBC WITH DIFFERENTIAL/PLATELET
Basophils Absolute: 0 10*3/uL (ref 0.0–0.2)
Basos: 0 %
EOS (ABSOLUTE): 0.1 10*3/uL (ref 0.0–0.4)
Eos: 2 %
Hematocrit: 37.8 % (ref 34.0–46.6)
Hemoglobin: 12.3 g/dL (ref 11.1–15.9)
IMMATURE GRANS (ABS): 0 10*3/uL (ref 0.0–0.1)
Immature Granulocytes: 0 %
LYMPHS ABS: 1.6 10*3/uL (ref 0.7–3.1)
LYMPHS: 20 %
MCH: 29.9 pg (ref 26.6–33.0)
MCHC: 32.5 g/dL (ref 31.5–35.7)
MCV: 92 fL (ref 79–97)
Monocytes Absolute: 0.3 10*3/uL (ref 0.1–0.9)
Monocytes: 4 %
NEUTROS ABS: 5.8 10*3/uL (ref 1.4–7.0)
Neutrophils: 74 %
PLATELETS: 188 10*3/uL (ref 150–379)
RBC: 4.12 x10E6/uL (ref 3.77–5.28)
RDW: 13.5 % (ref 12.3–15.4)
WBC: 7.8 10*3/uL (ref 3.4–10.8)

## 2017-06-17 ENCOUNTER — Other Ambulatory Visit: Payer: Self-pay | Admitting: Family Medicine

## 2017-06-18 ENCOUNTER — Other Ambulatory Visit: Payer: Self-pay | Admitting: Family Medicine

## 2017-06-18 ENCOUNTER — Telehealth: Payer: Self-pay | Admitting: Family Medicine

## 2017-06-18 NOTE — Telephone Encounter (Signed)
Pt is calling back to let us know to use the [facility] that is on file not the mail order  Best number [phone]

## 2017-06-18 NOTE — Telephone Encounter (Signed)
Pharmacy updated and sent to [facility]

## 2017-06-19 ENCOUNTER — Telehealth: Payer: Self-pay

## 2017-06-19 NOTE — Telephone Encounter (Signed)
Advised pt that rx for tramadol was called in to her pharmacy.

## 2017-06-19 NOTE — Telephone Encounter (Signed)
Called Tramadol into [facility].

## 2017-07-22 ENCOUNTER — Ambulatory Visit (INDEPENDENT_AMBULATORY_CARE_PROVIDER_SITE_OTHER): Payer: PPO | Admitting: *Deleted

## 2017-07-22 ENCOUNTER — Telehealth: Payer: Self-pay | Admitting: Cardiology

## 2017-07-22 DIAGNOSIS — I420 Dilated cardiomyopathy: Secondary | ICD-10-CM | POA: Diagnosis not present

## 2017-07-22 DIAGNOSIS — I5022 Chronic systolic (congestive) heart failure: Secondary | ICD-10-CM

## 2017-07-22 NOTE — Telephone Encounter (Signed)
Spoke with pt and reminded pt of remote transmission that is due today. Pt verbalized understanding.   

## 2017-07-23 ENCOUNTER — Other Ambulatory Visit: Payer: Self-pay | Admitting: Family Medicine

## 2017-07-23 NOTE — Progress Notes (Signed)
Remote ICD transmission.   

## 2017-07-24 ENCOUNTER — Other Ambulatory Visit: Payer: Self-pay | Admitting: Family Medicine

## 2017-07-24 LAB — CUP PACEART REMOTE DEVICE CHECK
Brady Statistic AP VP Percent: 35.7 %
Brady Statistic AS VP Percent: 64.25 %
Brady Statistic RA Percent Paced: 35.62 %
Brady Statistic RV Percent Paced: 99.7 %
HighPow Impedance: 70 Ohm
Implantable Lead Implant Date: 20150930
Implantable Lead Implant Date: 20150930
Implantable Lead Location: 753857
Implantable Lead Location: 753859
Implantable Lead Location: 753860
Implantable Lead Model: 5076
Implantable Pulse Generator Implant Date: 20150930
Lead Channel Impedance Value: 323 Ohm
Lead Channel Impedance Value: 342 Ohm
Lead Channel Impedance Value: 342 Ohm
Lead Channel Impedance Value: 380 Ohm
Lead Channel Impedance Value: 399 Ohm
Lead Channel Impedance Value: 456 Ohm
Lead Channel Impedance Value: 570 Ohm
Lead Channel Impedance Value: 646 Ohm
Lead Channel Pacing Threshold Amplitude: 0.5 V
Lead Channel Pacing Threshold Amplitude: 0.75 V
Lead Channel Pacing Threshold Pulse Width: 0.4 ms
Lead Channel Pacing Threshold Pulse Width: 0.4 ms
Lead Channel Pacing Threshold Pulse Width: 0.4 ms
Lead Channel Sensing Intrinsic Amplitude: 1.25 mV
Lead Channel Sensing Intrinsic Amplitude: 1.25 mV
Lead Channel Sensing Intrinsic Amplitude: 29.875 mV
Lead Channel Sensing Intrinsic Amplitude: 29.875 mV
Lead Channel Setting Pacing Amplitude: 1.5 V
Lead Channel Setting Pacing Amplitude: 2 V
Lead Channel Setting Pacing Amplitude: 2 V
Lead Channel Setting Pacing Pulse Width: 0.4 ms
Lead Channel Setting Sensing Sensitivity: 0.3 mV
MDC IDC LEAD IMPLANT DT: 20150930
MDC IDC MSMT BATTERY REMAINING LONGEVITY: 58 mo
MDC IDC MSMT BATTERY VOLTAGE: 2.98 V
MDC IDC MSMT LEADCHNL LV IMPEDANCE VALUE: 437 Ohm
MDC IDC MSMT LEADCHNL LV IMPEDANCE VALUE: 570 Ohm
MDC IDC MSMT LEADCHNL LV IMPEDANCE VALUE: 627 Ohm
MDC IDC MSMT LEADCHNL LV IMPEDANCE VALUE: 627 Ohm
MDC IDC MSMT LEADCHNL LV PACING THRESHOLD AMPLITUDE: 0.75 V
MDC IDC MSMT LEADCHNL RA IMPEDANCE VALUE: 380 Ohm
MDC IDC SESS DTM: 20181017011703
MDC IDC SET LEADCHNL LV PACING PULSEWIDTH: 0.4 ms
MDC IDC STAT BRADY AP VS PERCENT: 0.02 %
MDC IDC STAT BRADY AS VS PERCENT: 0.02 %

## 2017-07-25 ENCOUNTER — Encounter: Payer: Self-pay | Admitting: Cardiology

## 2017-07-28 DIAGNOSIS — N183 Chronic kidney disease, stage 3 (moderate): Secondary | ICD-10-CM | POA: Diagnosis not present

## 2017-07-28 DIAGNOSIS — E1122 Type 2 diabetes mellitus with diabetic chronic kidney disease: Secondary | ICD-10-CM | POA: Diagnosis not present

## 2017-07-28 DIAGNOSIS — R809 Proteinuria, unspecified: Secondary | ICD-10-CM | POA: Diagnosis not present

## 2017-07-28 DIAGNOSIS — I129 Hypertensive chronic kidney disease with stage 1 through stage 4 chronic kidney disease, or unspecified chronic kidney disease: Secondary | ICD-10-CM | POA: Diagnosis not present

## 2017-07-29 ENCOUNTER — Encounter: Payer: Self-pay | Admitting: Internal Medicine

## 2017-07-29 ENCOUNTER — Ambulatory Visit (INDEPENDENT_AMBULATORY_CARE_PROVIDER_SITE_OTHER): Payer: PPO | Admitting: Internal Medicine

## 2017-07-29 VITALS — BP 124/56 | HR 62 | Ht 68.0 in | Wt 287.0 lb

## 2017-07-29 DIAGNOSIS — I5022 Chronic systolic (congestive) heart failure: Secondary | ICD-10-CM | POA: Diagnosis not present

## 2017-07-29 DIAGNOSIS — Z95810 Presence of automatic (implantable) cardiac defibrillator: Secondary | ICD-10-CM | POA: Diagnosis not present

## 2017-07-29 DIAGNOSIS — I255 Ischemic cardiomyopathy: Secondary | ICD-10-CM

## 2017-07-29 LAB — CUP PACEART INCLINIC DEVICE CHECK
Battery Remaining Longevity: 58 mo
Brady Statistic AP VP Percent: 28.84 %
Brady Statistic AP VS Percent: 0.01 %
Brady Statistic AS VP Percent: 71.1 %
Brady Statistic AS VS Percent: 0.05 %
Brady Statistic RV Percent Paced: 99.68 %
HighPow Impedance: 62 Ohm
Implantable Lead Implant Date: 20150930
Implantable Lead Location: 753857
Implantable Lead Location: 753860
Implantable Lead Model: 4398
Implantable Lead Model: 5076
Lead Channel Impedance Value: 323 Ohm
Lead Channel Impedance Value: 342 Ohm
Lead Channel Impedance Value: 399 Ohm
Lead Channel Impedance Value: 399 Ohm
Lead Channel Impedance Value: 589 Ohm
Lead Channel Impedance Value: 589 Ohm
Lead Channel Pacing Threshold Amplitude: 0.5 V
Lead Channel Pacing Threshold Amplitude: 1 V
Lead Channel Pacing Threshold Pulse Width: 0.4 ms
Lead Channel Pacing Threshold Pulse Width: 0.4 ms
Lead Channel Setting Pacing Amplitude: 2 V
Lead Channel Setting Sensing Sensitivity: 0.3 mV
MDC IDC LEAD IMPLANT DT: 20150930
MDC IDC LEAD IMPLANT DT: 20150930
MDC IDC LEAD LOCATION: 753859
MDC IDC MSMT BATTERY VOLTAGE: 2.98 V
MDC IDC MSMT LEADCHNL LV IMPEDANCE VALUE: 323 Ohm
MDC IDC MSMT LEADCHNL LV IMPEDANCE VALUE: 380 Ohm
MDC IDC MSMT LEADCHNL LV IMPEDANCE VALUE: 532 Ohm
MDC IDC MSMT LEADCHNL LV IMPEDANCE VALUE: 532 Ohm
MDC IDC MSMT LEADCHNL LV IMPEDANCE VALUE: 589 Ohm
MDC IDC MSMT LEADCHNL LV PACING THRESHOLD PULSEWIDTH: 0.4 ms
MDC IDC MSMT LEADCHNL RA SENSING INTR AMPL: 1.5 mV
MDC IDC MSMT LEADCHNL RV IMPEDANCE VALUE: 399 Ohm
MDC IDC MSMT LEADCHNL RV IMPEDANCE VALUE: 494 Ohm
MDC IDC MSMT LEADCHNL RV PACING THRESHOLD AMPLITUDE: 0.75 V
MDC IDC MSMT LEADCHNL RV SENSING INTR AMPL: 28.625 mV
MDC IDC PG IMPLANT DT: 20150930
MDC IDC SESS DTM: 20181023153758
MDC IDC SET LEADCHNL LV PACING PULSEWIDTH: 0.4 ms
MDC IDC SET LEADCHNL RA PACING AMPLITUDE: 1.5 V
MDC IDC SET LEADCHNL RV PACING AMPLITUDE: 2 V
MDC IDC SET LEADCHNL RV PACING PULSEWIDTH: 0.4 ms
MDC IDC STAT BRADY RA PERCENT PACED: 28.77 %

## 2017-07-29 NOTE — Patient Instructions (Signed)
Medication Instructions:  Your physician recommends that you continue on your current medications as directed. Please refer to the Current Medication list given to you today.   Labwork: NONE  Testing/Procedures: Remote monitoring is used to monitor your Pacemaker of ICD from home. This monitoring reduces the number of office visits required to check your device to one time per year. It allows [place] to keep an eye on the functioning of your device to ensure it is working properly. You are scheduled for a device check from home on 10/21/2017. You may send your transmission at any time that day. If you have a wireless device, the transmission will be sent automatically. After your physician reviews your transmission, you will receive a postcard with your next transmission date.    Follow-Up: Your physician wants you to follow-up in: 5-6 [place]. You will receive a reminder letter in the mail two months in advance. If you don't receive a letter, please call our office to schedule the follow-up appointment.  Your physician wants you to follow-up in: [place]. You will receive a reminder letter in the mail two months in advance. If you don't receive a letter, please call our office to schedule the follow-up appointment.   If you need a refill on your cardiac medications before your next appointment, please call your pharmacy.

## 2017-07-29 NOTE — Progress Notes (Signed)
Review    Electrophysiology Office Note   Date:  07/29/2017   ID:  [name], DOB [birth year], MRN [id number]  PCP:  Wardell Honour, MD  Cardiologist:  CHF Primary Electrophysiologist:    Virl Axe, MD    Chief Complaint  Patient presents with  . other    12 month follow up. Patient denies chest pain and SOB. Meds reviewed verbally with patient.      History of Present Illness: [name] is a 61 y.o. female  seen today for follow-up of CRT-D implantation undertaken 9/15. 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 There is marked interval improvement in LV function   She was seen a couple of weeks ago in [place]. Her optivol is markedly increased and her volume status was overloaded with worsening dyspnea edema and orthopnea.   Denies sob or chest pain    Mild edema  She has morbid obesity.   She has OSA But has not been using her CPAP   Date Cr K  7/17 1.53 5.0  8/18 1.96 5.4     DATE TEST    9/15  echo   EF 30 %   1 /17 echo   EF 50 %    Above records were reviewed personally and with the patient  Past Medical History:  Diagnosis Date  . Acute myocardial infarction, subendocardial infarction, initial episode of care ([place]) 07/21/2012  . Acute osteomyelitis involving ankle and foot ([place]) 02/06/2015  . Acute systolic heart failure ([place]) 07/21/2012   New onset 07/19/12; admission to [facility] ED. Elevated Troponins.  S/p 2D-echo with EF 20-25%.  S/p cardiac catheterization with stenting LAD.  Repeat 2D-echo 10/2011 with improved EF of 35%.   . Anemia   . Automatic implantable cardioverter-defibrillator in situ    a. MDT CRT-D 06/2014, SN: [id number]    . CAD (coronary artery disease)    a. cardiac cath 101/04/2012: PCI/DES to chronically occluded mLAD, consideration PCI to diag branch in 4 weeks.   . Cataract   . Chicken pox   . Chronic systolic CHF (congestive heart failure) (HCC)    a. mixed  ICM & NICM; b. EF 20-25% by echo 07/2012, mid-dist 2/3 of LV sev HK/AK, mild MR. echo 10/2012: EF 30-35%, sev HK ant-septal & inf walls, GR1DD, mild MR, PASP 33. c. echo 02/2013: EF 30%, GR1DD, mild MR. echo 04/2014: EF 30%, Septal-lat dyssynchrony, global HK, inf AK, GR1DD, mild MR. d. echo 10/2014: EF50-55%, WM nl, GR1DD, septal mild paradox. e. echo 02/2015: EF 50-55%, wm   . Depression   . Heart attack ([place])   . Heel ulcer ([place]) 04/27/2015  . History of blood transfusion ~ 2011   "plasma; had neuropathy; couldn't walk"  . Hypertension   . LBBB (left bundle branch block)   . Neuropathy 2011  . Obesity, unspecified   . OSA on CPAP    Moderate with AHI 23/hr and now on CPAP at 16cm H2O.  Her DME is [facility]  . Pure hypercholesterolemia   . Type II diabetes mellitus ([place])   . Unspecified vitamin D deficiency    Past Surgical History:  Procedure Laterality Date  . AMPUTATION TOE Right 06/18/2015   Procedure: AMPUTATION TOE;  Surgeon: Samara Deist, DPM;  Location: [facility] ORS;  Service: Podiatry;  Laterality: Right;  . BACK SURGERY    . BI-VENTRICULAR IMPLANTABLE CARDIOVERTER DEFIBRILLATOR N/A 07/06/2014   Procedure: BI-VENTRICULAR IMPLANTABLE CARDIOVERTER DEFIBRILLATOR  (  CRT-D);  Surgeon: Deboraha Sprang, MD;  Location: [facility] CATH LAB;  Service: Cardiovascular;  Laterality: N/A;  . BI-VENTRICULAR IMPLANTABLE CARDIOVERTER DEFIBRILLATOR  (CRT-D)  07/06/2014  . BILATERAL OOPHORECTOMY  01/2011   ovarian cyst benign  . COLONOSCOPY WITH PROPOFOL Left 02/22/2015   Procedure: COLONOSCOPY WITH PROPOFOL;  Surgeon: Hulen Luster, MD;  Location: [facility] ENDOSCOPY;  Service: Endoscopy;  Laterality: Left;  . CORONARY ANGIOPLASTY WITH STENT PLACEMENT Left 07/2012   new onset systolic CHF; elevated troponins.  Cardiac catheterization with stenting to LAD; EF 15%.  2D-echo: EF 20-25%.  . ESOPHAGOGASTRODUODENOSCOPY N/A 02/22/2015   Procedure: ESOPHAGOGASTRODUODENOSCOPY (EGD);  Surgeon: Hulen Luster, MD;  Location: [facility] ENDOSCOPY;   Service: Endoscopy;  Laterality: N/A;  . INCISION AND DRAINAGE ABSCESS Right 2007   groin; with ICU stay due to sepsis.  Marland Kitchen LAPAROSCOPIC CHOLECYSTECTOMY  2011  . LEFT HEART CATHETERIZATION WITH CORONARY ANGIOGRAM N/A 07/21/2012   Procedure: LEFT HEART CATHETERIZATION WITH CORONARY ANGIOGRAM;  Surgeon: Jolaine Artist, MD;  Location: [facility] CATH LAB;  Service: Cardiovascular;  Laterality: N/A;  . [place]   L4-5  . PERCUTANEOUS CORONARY STENT INTERVENTION (PCI-S) N/A 07/23/2012   Procedure: PERCUTANEOUS CORONARY STENT INTERVENTION (PCI-S);  Surgeon: Sherren Mocha, MD;  Location: [facility] CATH LAB;  Service: Cardiovascular;  Laterality: N/A;  . PERIPHERAL VASCULAR CATHETERIZATION N/A 02/10/2015   Procedure: Picc Line Insertion;  Surgeon: Katha Cabal, MD;  Location: [place] CV LAB;  Service: Cardiovascular;  Laterality: N/A;  . TUBAL LIGATION  1981  . VAGINAL HYSTERECTOMY  01/2011   Fibroids/DUB.  Ovaries removed. Fontaine.     Current Outpatient Prescriptions  Medication Sig Dispense Refill  . AMBULATORY NON FORMULARY MEDICATION 1 Units by Other route once. Medication Name: foot brace 1 Units 0  . B-D ULTRAFINE III SHORT PEN 31G X 8 MM MISC USE AS DIRECTED WITH LANTUS 100 each 3  . Calcium Carb-Cholecalciferol (CALCIUM 600 + D PO) Take 1 tablet by mouth 2 (two) times daily.    . carvedilol (COREG) 3.125 MG tablet TAKE 1 TABLET BY MOUTH TWICE A DAY 60 tablet 3  . citalopram (CELEXA) 20 MG tablet TAKE 1 TABLET BY MOUTH DAILY 90 tablet 1  . clopidogrel (PLAVIX) 75 MG tablet TAKE 1 TABLET BY MOUTH ONCE DAILY 90 tablet 3  . gabapentin (NEURONTIN) 600 MG tablet TAKE 1 TABLET BY MOUTH 3 TIMES DAILY 270 tablet 1  . glimepiride (AMARYL) 4 MG tablet TAKE 1 TABLET BY MOUTH TWICE A DAY BEFORE A MEAL. 180 tablet 3  . Insulin Glargine (LANTUS) 100 UNIT/ML Solostar Pen Inject 22 Units into the skin at bedtime. 15 mL 11  . isosorbide mononitrate (IMDUR) 30 MG 24 hr tablet TAKE 1 TABLET  BY MOUTH ONCE DAILY 30 tablet 1  . MULTIPLE VITAMIN PO Take 1 tablet by mouth every morning.     . nitroGLYCERIN (NITROSTAT) 0.4 MG SL tablet Place under the tongue. Reported on 04/16/2016    . Omega-3 Fatty Acids (FISH OIL) 1000 MG CAPS Take 1,000 mg by mouth 2 (two) times daily.     . ONE TOUCH ULTRA TEST test strip USE TO CHECK BLOOD SUGAR 3 TIMES A DAY 300 each 6  . OVER THE COUNTER MEDICATION     . rosuvastatin (CRESTOR) 40 MG tablet Take 1 tablet (40 mg total) by mouth daily. 90 tablet 3  . torsemide (DEMADEX) 20 MG tablet TAKE 1 TABLET BY MOUTH DAILY 30 tablet 3  . traMADol (  ULTRAM) 50 MG tablet TAKE 1 TABLET EVERY 6 HOURS AS NEEDED FOR PAIN 120 tablet 5   No current facility-administered medications for this visit.     Allergies:   Patient has no known allergies.   Social History:  The patient  reports that she has never smoked. She has never used smokeless tobacco. She reports that she does not drink alcohol or use drugs.   Family History:  The patient's family history includes Arthritis in her mother; COPD in her maternal grandmother; Cancer in her father and maternal grandfather; Diabetes in her maternal grandmother, mother, paternal grandfather, paternal grandmother, and son; Heart disease in her maternal grandmother; Hypertension in her mother and son; Obesity in her brother.    ROS:  Please see the history of present illness.   .   All other systems are reviewed and negative.    PHYSICAL EXAM: VS:  BP (!) 124/56 (BP Location: Left Arm, Patient Position: Sitting, Cuff Size: Large)   Pulse 62   Ht 5\' 8"  (1.727 m)   Wt 287 lb (130.2 kg)   BMI 43.64 kg/m  , BMI Body mass index is 43.64 kg/m. Well developed and nourished in no acute distress HENT normal Neck supple with JVP-flat Carotids brisk and full without bruits Clear Regular rate and rhythm, no murmurs or gallops Abd-soft with active BS without hepatomegaly No Clubbing cyanosis tredema Skin-warm and dry A &  Oriented  Grossly normal sensory and motor function   EKG:   Sinus rhythm with PVCs synchronous biventricular pacing  QRS negative lead 1 upright lead V1   Device interrogation is reviewed today in detail.  See PaceArt for details.   Recent Labs: 05/28/2017: ALT 13; BUN 35; Creatinine, Ser 1.96; Hemoglobin 12.3; Platelets 188; Potassium 5.4; Sodium 141    Lipid Panel     Component Value Date/Time   CHOL 116 02/25/2017 1100   TRIG 175 (H) 02/25/2017 1100   HDL 32 (L) 02/25/2017 1100   CHOLHDL 3.6 02/25/2017 1100   CHOLHDL 5.6 (H) 08/06/2016 1431   VLDL 34 (H) 08/06/2016 1431   LDLCALC 49 02/25/2017 1100     Wt Readings from Last 3 Encounters:  07/29/17 287 lb (130.2 kg)  05/28/17 287 lb (130.2 kg)  02/26/17 285 lb 9 oz (129.5 kg)      Other studies Reviewed:    ASSESSMENT AND PLAN: Ischemic cardiomyopathy-interval normalization   CRT-D  The patient's device was interrogated.  The information was reviewed. No changes were made in the programming.     CHF chronic systolic  Abnormal QRS vector  Renal Insufficiency  Followed closely by renal  Without symptoms of ischemia  Euvolemic continue current meds  Will seek to clarify meds  Thinks she is on losartan but have no records  Following up with renal yday and with low BP decreased imdur--agree    No orders of the defined types were placed in this encounter.    Disposition:   FU with 12 months  Signed, Virl Axe, MD  07/29/2017 11:29 AM     [address] [place] [address] [phone] (office) [phone] (fax)

## 2017-08-01 DIAGNOSIS — N183 Chronic kidney disease, stage 3 (moderate): Secondary | ICD-10-CM | POA: Diagnosis not present

## 2017-08-01 DIAGNOSIS — E1122 Type 2 diabetes mellitus with diabetic chronic kidney disease: Secondary | ICD-10-CM | POA: Diagnosis not present

## 2017-08-01 DIAGNOSIS — R809 Proteinuria, unspecified: Secondary | ICD-10-CM | POA: Diagnosis not present

## 2017-08-01 DIAGNOSIS — I129 Hypertensive chronic kidney disease with stage 1 through stage 4 chronic kidney disease, or unspecified chronic kidney disease: Secondary | ICD-10-CM | POA: Diagnosis not present

## 2017-08-11 ENCOUNTER — Telehealth: Payer: Self-pay

## 2017-08-11 NOTE — Telephone Encounter (Signed)
Called pt to schedule Medicare Annual Wellness Visit. Please schedule pt for AWV with nurse health advisor 1-2 weeks in advance of physical with PCP or give pt my direct number below to schedule. -nr   [name], B.A.  Care Guide - [facility] [place]

## 2017-09-02 ENCOUNTER — Ambulatory Visit: Payer: PPO | Admitting: Family Medicine

## 2017-09-04 DIAGNOSIS — E113292 Type 2 diabetes mellitus with mild nonproliferative diabetic retinopathy without macular edema, left eye: Secondary | ICD-10-CM | POA: Diagnosis not present

## 2017-09-04 DIAGNOSIS — H25813 Combined forms of age-related cataract, bilateral: Secondary | ICD-10-CM | POA: Diagnosis not present

## 2017-09-04 DIAGNOSIS — H524 Presbyopia: Secondary | ICD-10-CM | POA: Diagnosis not present

## 2017-09-04 DIAGNOSIS — E113211 Type 2 diabetes mellitus with mild nonproliferative diabetic retinopathy with macular edema, right eye: Secondary | ICD-10-CM | POA: Diagnosis not present

## 2017-09-08 ENCOUNTER — Ambulatory Visit (INDEPENDENT_AMBULATORY_CARE_PROVIDER_SITE_OTHER): Payer: PPO

## 2017-09-08 VITALS — BP 106/60 | HR 62 | Temp 98.0°F | Ht 68.0 in | Wt 287.2 lb

## 2017-09-08 DIAGNOSIS — Z0000 Encounter for general adult medical examination without abnormal findings: Secondary | ICD-10-CM | POA: Diagnosis not present

## 2017-09-08 NOTE — Progress Notes (Signed)
Subjective:   [name] is a 61 y.o. female who presents for Medicare Annual (Subsequent) preventive examination.  Review of Systems:  N/A Cardiac Risk Factors include: diabetes mellitus;hypertension;dyslipidemia;obesity (BMI >30kg/m2)     Objective:     Vitals: BP 106/60   Pulse 62   Temp 98 F (36.7 C) (Oral)   Ht 5\' 8"  (1.727 m)   Wt 287 lb 4 oz (130.3 kg)   SpO2 98%   BMI 43.68 kg/m   Body mass index is 43.68 kg/m.  Advanced Directives 09/08/2017 07/03/2016 05/03/2016 11/11/2015 11/11/2015 06/16/2015 03/13/2015  Does Patient Have a Medical Advance Directive? Yes Yes Yes Yes Yes Yes No  Type of Paramedic of [place];Living will Living will - Living will - Healthcare Power of [place];Living will  Does patient want to make changes to medical advance directive? - No - Patient declined - No - Patient declined - Yes - information given -  Copy of [place] in Chart? Yes No - copy requested No - copy requested No - copy requested - No - copy requested No - copy requested  Would patient like information on creating a medical advance directive? - - - - - - -  Pre-existing out of facility DNR order (yellow form or pink MOST form) - - - - - - -    Tobacco Social History   Tobacco Use  Smoking Status Never Smoker  Smokeless Tobacco Never Used     Counseling given: Not Answered   Clinical Intake:  Pre-visit preparation completed: Yes  Pain : No/denies pain     Nutritional Status: BMI > 30  Obese Nutritional Risks: None Diabetes: Yes CBG done?: No Did pt. bring in CBG monitor from home?: No  Activities of Daily Living: Independent Ambulation: Independent with device- listed below Home Assistive Devices/Equipment: Shower/tub chair, Elevated toliet seat Medication Administration: Independent Home Management: Independent  Barriers to Care Management & Learning: None  Do you feel unsafe in your  current relationship?: No Do you feel physically threatened by others?: No Anyone hurting you at home, work, or school?: No Unable to ask?: No  How often do you need to have someone help you when you read instructions, pamphlets, or other written materials from your doctor or pharmacy?: 1 - Never What is the last grade level you completed in school?: 12th grade  Interpreter Needed?: No  Information entered by :: Andrez Grime, LPN  Past Medical History:  Diagnosis Date  . Acute myocardial infarction, subendocardial infarction, initial episode of care ([place]) 07/21/2012  . Acute osteomyelitis involving ankle and foot ([place]) 02/06/2015  . Acute systolic heart failure ([place]) 07/21/2012   New onset 07/19/12; admission to [facility] ED. Elevated Troponins.  S/p 2D-echo with EF 20-25%.  S/p cardiac catheterization with stenting LAD.  Repeat 2D-echo 10/2011 with improved EF of 35%.   . Anemia   . Automatic implantable cardioverter-defibrillator in situ    a. MDT CRT-D 06/2014, SN: [id number]    . CAD (coronary artery disease)    a. cardiac cath 101/04/2012: PCI/DES to chronically occluded mLAD, consideration PCI to diag branch in 4 weeks.   . Cataract   . Chicken pox   . Chronic systolic CHF (congestive heart failure) (HCC)    a. mixed ICM & NICM; b. EF 20-25% by echo 07/2012, mid-dist 2/3 of LV sev HK/AK, mild MR. echo 10/2012: EF 30-35%, sev HK ant-septal & inf walls, GR1DD, mild MR,  PASP 33. c. echo 02/2013: EF 30%, GR1DD, mild MR. echo 04/2014: EF 30%, Septal-lat dyssynchrony, global HK, inf AK, GR1DD, mild MR. d. echo 10/2014: EF50-55%, WM nl, GR1DD, septal mild paradox. e. echo 02/2015: EF 50-55%, wm   . Depression   . Heart attack ([place])   . Heel ulcer ([place]) 04/27/2015  . History of blood transfusion ~ 2011   "plasma; had neuropathy; couldn't walk"  . Hypertension   . LBBB (left bundle branch block)   . Neuropathy 2011  . Obesity, unspecified   . OSA on CPAP    Moderate with AHI 23/hr and now  on CPAP at 16cm H2O.  Her DME is [facility]  . Pure hypercholesterolemia   . Type II diabetes mellitus ([place])   . Unspecified vitamin D deficiency    Past Surgical History:  Procedure Laterality Date  . AMPUTATION TOE Right 06/18/2015   Procedure: AMPUTATION TOE;  Surgeon: Samara Deist, DPM;  Location: [facility] ORS;  Service: Podiatry;  Laterality: Right;  . BACK SURGERY    . BI-VENTRICULAR IMPLANTABLE CARDIOVERTER DEFIBRILLATOR N/A 07/06/2014   Procedure: BI-VENTRICULAR IMPLANTABLE CARDIOVERTER DEFIBRILLATOR  (CRT-D);  Surgeon: Deboraha Sprang, MD;  Location: [facility] CATH LAB;  Service: Cardiovascular;  Laterality: N/A;  . BI-VENTRICULAR IMPLANTABLE CARDIOVERTER DEFIBRILLATOR  (CRT-D)  07/06/2014  . BILATERAL OOPHORECTOMY  01/2011   ovarian cyst benign  . COLONOSCOPY WITH PROPOFOL Left 02/22/2015   Procedure: COLONOSCOPY WITH PROPOFOL;  Surgeon: Hulen Luster, MD;  Location: [facility] ENDOSCOPY;  Service: Endoscopy;  Laterality: Left;  . CORONARY ANGIOPLASTY WITH STENT PLACEMENT Left 07/2012   new onset systolic CHF; elevated troponins.  Cardiac catheterization with stenting to LAD; EF 15%.  2D-echo: EF 20-25%.  . ESOPHAGOGASTRODUODENOSCOPY N/A 02/22/2015   Procedure: ESOPHAGOGASTRODUODENOSCOPY (EGD);  Surgeon: Hulen Luster, MD;  Location: [facility] ENDOSCOPY;  Service: Endoscopy;  Laterality: N/A;  . INCISION AND DRAINAGE ABSCESS Right 2007   groin; with ICU stay due to sepsis.  Marland Kitchen LAPAROSCOPIC CHOLECYSTECTOMY  2011  . LEFT HEART CATHETERIZATION WITH CORONARY ANGIOGRAM N/A 07/21/2012   Procedure: LEFT HEART CATHETERIZATION WITH CORONARY ANGIOGRAM;  Surgeon: Jolaine Artist, MD;  Location: [facility] CATH LAB;  Service: Cardiovascular;  Laterality: N/A;  . [place]   L4-5  . PERCUTANEOUS CORONARY STENT INTERVENTION (PCI-S) N/A 07/23/2012   Procedure: PERCUTANEOUS CORONARY STENT INTERVENTION (PCI-S);  Surgeon: Sherren Mocha, MD;  Location: [facility] CATH LAB;  Service: Cardiovascular;  Laterality: N/A;  . PERIPHERAL  VASCULAR CATHETERIZATION N/A 02/10/2015   Procedure: Picc Line Insertion;  Surgeon: Katha Cabal, MD;  Location: [place] CV LAB;  Service: Cardiovascular;  Laterality: N/A;  . TUBAL LIGATION  1981  . VAGINAL HYSTERECTOMY  01/2011   Fibroids/DUB.  Ovaries removed. Fontaine.   Family History  Problem Relation Age of Onset  . Diabetes Mother   . Hypertension Mother   . Arthritis Mother        knees, lumbar DDD, cervical DDD  . Cancer Father        prostate,skin,lymphoma.  . Obesity Brother   . Diabetes Maternal Grandmother   . Heart disease Maternal Grandmother   . COPD Maternal Grandmother   . Diabetes Paternal Grandmother   . Diabetes Son   . Hypertension Son   . Cancer Maternal Grandfather   . Diabetes Paternal Grandfather    Social History   Socioeconomic History  . Marital status: Married    Spouse name: None  . Number of children: 2  . Years of  education: 43  . Highest education level: High school graduate  Social Needs  . Financial resource strain: Not hard at all  . Food insecurity - worry: Never true  . Food insecurity - inability: Never true  . Transportation needs - medical: No  . Transportation needs - non-medical: No  Occupational History  . Occupation: disabled    Fish farm manager: UNEMPLOYED    Comment: 03/2010 for peripheral neuropathy  . 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  Tobacco Use  . Smoking status: Never Smoker  . Smokeless tobacco: Never Used  Substance and Sexual Activity  . Alcohol use: No    Alcohol/week: 0.0 oz  . Drug use: No  . Sexual activity: No    Birth control/protection: Post-menopausal, Surgical  Other Topics Concern  . None  Social History Narrative   Marital status:Married x 44 yrs. Happily married, no abuse.       Children:  2 children (daughter 74, son 77). Two grandsons and 2 step grandchildren.       Lives: with husband, daughter, son-in-law, 2 grandsons.      Employment: disability for peripheral neuropathy  2012.  Previously had home daycare.      Tobacco: none      Alcohol: none      Drugs: none      Exercise: none. [employer].      Pets: dog.      Always uses seat belts, smoke detectors in home.      No guns in the home.       Caffeine use: 2 cups coffee per day.       Nutrition: Well balanced diet.    Outpatient Encounter Medications as of 09/08/2017  Medication Sig  . AMBULATORY NON FORMULARY MEDICATION 1 Units by Other route once. Medication Name: foot brace  . B-D ULTRAFINE III SHORT PEN 31G X 8 MM MISC USE AS DIRECTED WITH LANTUS  . Calcium Carb-Cholecalciferol (CALCIUM 600 + D PO) Take 1 tablet by mouth 2 (two) times daily.  . carvedilol (COREG) 3.125 MG tablet TAKE 1 TABLET BY MOUTH TWICE A DAY  . citalopram (CELEXA) 20 MG tablet TAKE 1 TABLET BY MOUTH DAILY  . clopidogrel (PLAVIX) 75 MG tablet TAKE 1 TABLET BY MOUTH ONCE DAILY  . gabapentin (NEURONTIN) 600 MG tablet TAKE 1 TABLET BY MOUTH 3 TIMES DAILY  . glimepiride (AMARYL) 4 MG tablet TAKE 1 TABLET BY MOUTH TWICE A DAY BEFORE A MEAL.  Marland Kitchen Insulin Glargine (LANTUS) 100 UNIT/ML Solostar Pen Inject 22 Units into the skin at bedtime.  . isosorbide mononitrate (IMDUR) 30 MG 24 hr tablet TAKE 1 TABLET BY MOUTH ONCE DAILY  . MULTIPLE VITAMIN PO Take 1 tablet by mouth every morning.   . nitroGLYCERIN (NITROSTAT) 0.4 MG SL tablet Place under the tongue. Reported on 04/16/2016  . Omega-3 Fatty Acids (FISH OIL) 1000 MG CAPS Take 1,000 mg by mouth 2 (two) times daily.   . ONE TOUCH ULTRA TEST test strip USE TO CHECK BLOOD SUGAR 3 TIMES A DAY  . OVER THE COUNTER MEDICATION   . rosuvastatin (CRESTOR) 40 MG tablet Take 1 tablet (40 mg total) by mouth daily.  Marland Kitchen torsemide (DEMADEX) 20 MG tablet TAKE 1 TABLET BY MOUTH DAILY  . traMADol (ULTRAM) 50 MG tablet TAKE 1 TABLET EVERY 6 HOURS AS NEEDED FOR PAIN   No facility-administered encounter medications on file as of 09/08/2017.     Activities of Daily Living In your present  state of  health, do you have any difficulty performing the following activities: 09/08/2017  Hearing? N  Vision? N  Difficulty concentrating or making decisions? N  Walking or climbing stairs? Y  Comment Patient has issues climbing stairs  Dressing or bathing? N  Doing errands, shopping? N  Preparing Food and eating ? N  Using the Toilet? N  In the past six months, have you accidently leaked urine? N  Do you have problems with loss of bowel control? N  Managing your Medications? N  Managing your Finances? N  Housekeeping or managing your Housekeeping? N  Some recent data might be hidden    Timed Get Up and Go performed: yes, passed, completed within 30 seconds  Patient Care Team: Wardell Honour, MD as PCP - General (Family Medicine) Calvert Cantor, MD as Consulting Physician (Ophthalmology) Lavonia Dana, MD as Consulting Physician (Internal Medicine)    Assessment:    Exercise Activities and Dietary recommendations Current Exercise Habits: The patient does not participate in regular exercise at present, Exercise limited by: None identified  Goals    . DIET - REDUCE SUGAR INTAKE     Patient states that she wants to try to cut back on her sugar intake daily.       Fall Risk Fall Risk  09/08/2017 05/28/2017 02/26/2017 02/25/2017 08/23/2016  Falls in the past year? Yes No No No Yes  Number falls in past yr: 1 - - - 2 or more  Injury with Fall? Yes - - - No  Comment Scrapped her elbow and knee - - - -  Risk for fall due to : (No Data) - - - Other (Comment)  Risk for fall due to: Comment Patient loss balance reaching for something and fell. - - - -  Follow up Falls prevention discussed - - - Falls evaluation completed;Education provided;Falls prevention discussed   Is the patient's home free of loose throw rugs in walkways, pet beds, electrical cords, etc?   yes      Grab bars in the bathroom? no      Handrails on the stairs?   yes      Adequate lighting?   yes  Depression  Screen PHQ 2/9 Scores 09/08/2017 05/28/2017 02/25/2017 08/13/2016  PHQ - 2 Score 0 0 2 1  PHQ- 9 Score - - 2 -     Cognitive Function     6CIT Screen 09/08/2017  What Year? 0 points  What month? 0 points  What time? 0 points  Count back from 20 0 points  Months in reverse 0 points  Repeat phrase 2 points  Total Score 2    Immunization History  Administered Date(s) Administered  . Hepatitis B 03/16/2013  . Hepatitis B, adult 03/30/2014, 07/18/2014  . Influenza Split 07/21/2012  . Influenza,inj,Quad PF,6+ Mos 06/16/2013, 07/07/2014, 06/18/2015, 08/06/2016  . Influenza-Unspecified 07/23/2017  . Pneumococcal Polysaccharide-23 08/08/2011, 06/18/2015, 08/13/2016  . Tdap 03/07/2010, 05/03/2016  . Zoster 04/06/2014   Screening Tests Health Maintenance  Topic Date Due  . URINE MICROALBUMIN  09/15/2017 (Originally 08/06/2017)  . HEMOGLOBIN A1C  11/28/2017  . OPHTHALMOLOGY EXAM  02/11/2018  . FOOT EXAM  05/28/2018  . MAMMOGRAM  03/12/2019  . COLONOSCOPY  02/21/2025  . TETANUS/TDAP  05/03/2026  . PNEUMOCOCCAL POLYSACCHARIDE VACCINE  Completed  . INFLUENZA VACCINE  Completed  . Hepatitis C Screening  Completed  . HIV Screening  Completed   Cancer Screenings: Lung:  Low Dose CT Chest recommended if Age 45-80 years,  30 pack-year currently smoking OR have quit w/in 15years. Patient does not qualify. Breast:  Up to date on Mammogram? Yes, completed 03/11/17 Up to date of Bone Density/Dexa? Not indicated Colorectal: Colonoscopy completed 02/22/2015  Additional Screenings:  Hepatitis B/HIV/Syphillis:HIV completed 08/04/15, Hep B and Syphillis not indicated Hepatitis C Screening: completed 08/04/15     Plan:   I have personally reviewed and noted the following in the patient's chart:   . Medical and social history . Use of alcohol, tobacco or illicit drugs  . Current medications and supplements . Functional ability and status . Nutritional status . Physical activity . Advanced  directives . List of other physicians . Hospitalizations, surgeries, and ER visits in previous 12 months . Vitals . Screenings to include cognitive, depression, and falls . Referrals and appointments  In addition, I have reviewed and discussed with patient certain preventive protocols, quality metrics, and best practice recommendations. A written personalized care plan for preventive services as well as general preventive health recommendations were provided to patient.  Patient will have blood work completed at her upcoming visit with her PCP.  1. Encounter for Medicare annual wellness exam   Andrez Grime, LPN  [id number]

## 2017-09-08 NOTE — Patient Instructions (Addendum)
[name] , Thank you for taking time to come for your Medicare Wellness Visit. I appreciate your ongoing commitment to your health goals. Please review the following plan we discussed and let me know if I can assist you in the future.   Screening recommendations/referrals: Colonoscopy: up to date, next due 02/21/2025 Mammogram: up to date, next due 03/12/2019 Bone Density: not indicated, starts at age 63 Recommended yearly ophthalmology/optometry visit for glaucoma screening and checkup Recommended yearly dental visit for hygiene and checkup  Vaccinations: Influenza vaccine: up to date  Pneumococcal vaccine: up to date, Prevnar 31 at age 78 Tdap vaccine: up to date, next due 05/03/2026 Shingles vaccine: up to date, Check with your pharmacy about receiving the Shingrix vaccine    Advanced directives: copy in chart  Conditions/risks identified: Try to cut back on your sugar intake daily.   Next appointment: 09/15/17 @ 10:20 am with Dr. Tamala Julian    Preventive Care 40-64 Years, Female Preventive care refers to lifestyle choices and visits with your health care provider that can promote health and wellness. What does preventive care include?  A yearly physical exam. This is also called an annual well check.  Dental exams once or twice a year.  Routine eye exams. Ask your health care provider how often you should have your eyes checked.  Personal lifestyle choices, including:  Daily care of your teeth and gums.  Regular physical activity.  Eating a healthy diet.  Avoiding tobacco and drug use.  Limiting alcohol use.  Practicing safe sex.  Taking low-dose aspirin daily starting at age 8.  Taking vitamin and mineral supplements as recommended by your health care provider. What happens during an annual well check? The services and screenings done by your health care provider during your annual well check will depend on your age, overall health, lifestyle risk factors, and  family history of disease. Counseling  Your health care provider may ask you questions about your:  Alcohol use.  Tobacco use.  Drug use.  Emotional well-being.  Home and relationship well-being.  Sexual activity.  Eating habits.  Work and work Statistician.  Method of birth control.  Menstrual cycle.  Pregnancy history. Screening  You may have the following tests or measurements:  Height, weight, and BMI.  Blood pressure.  Lipid and cholesterol levels. These may be checked every 5 years, or more frequently if you are over 92 years old.  Skin check.  Lung cancer screening. You may have this screening every year starting at age 52 if you have a 30-pack-year history of smoking and currently smoke or have quit within the past 15 years.  Fecal occult blood test (FOBT) of the stool. You may have this test every year starting at age 24.  Flexible sigmoidoscopy or colonoscopy. You may have a sigmoidoscopy every 5 years or a colonoscopy every 10 years starting at age 70.  Hepatitis C blood test.  Hepatitis B blood test.  Sexually transmitted disease (STD) testing.  Diabetes screening. This is done by checking your blood sugar (glucose) after you have not eaten for a while (fasting). You may have this done every 1-3 years.  Mammogram. This may be done every 1-2 years. Talk to your health care provider about when you should start having regular mammograms. This may depend on whether you have a family history of breast cancer.  BRCA-related cancer screening. This may be done if you have a family history of breast, ovarian, tubal, or peritoneal cancers.  Pelvic exam and  Pap test. This may be done every 3 years starting at age 32. Starting at age 66, this may be done every 5 years if you have a Pap test in combination with an HPV test.  Bone density scan. This is done to screen for osteoporosis. You may have this scan if you are at high risk for osteoporosis. Discuss your  test results, treatment options, and if necessary, the need for more tests with your health care provider. Vaccines  Your health care provider may recommend certain vaccines, such as:  Influenza vaccine. This is recommended every year.  Tetanus, diphtheria, and acellular pertussis (Tdap, Td) vaccine. You may need a Td booster every 10 years.  Zoster vaccine. You may need this after age 76.  Pneumococcal 13-valent conjugate (PCV13) vaccine. You may need this if you have certain conditions and were not previously vaccinated.  Pneumococcal polysaccharide (PPSV23) vaccine. You may need one or two doses if you smoke cigarettes or if you have certain conditions. Talk to your health care provider about which screenings and vaccines you need and how often you need them. This information is not intended to replace advice given to you by your health care provider. Make sure you discuss any questions you have with your health care provider. Document Released: 10/20/2015 Document Revised: 06/12/2016 Document Reviewed: 07/25/2015 Elsevier Interactive Patient Education  2017 [place] Prevention in the Home Falls can cause injuries. They can happen to people of all ages. There are many things you can do to make your home safe and to help prevent falls. What can I do on the outside of my home?  Regularly fix the edges of walkways and driveways and fix any cracks.  Remove anything that might make you trip as you walk through a door, such as a raised step or threshold.  Trim any bushes or trees on the path to your home.  Use bright outdoor lighting.  Clear any walking paths of anything that might make someone trip, such as rocks or tools.  Regularly check to see if handrails are loose or broken. Make sure that both sides of any steps have handrails.  Any raised decks and porches should have guardrails on the edges.  Have any leaves, snow, or ice cleared regularly.  Use sand or  salt on walking paths during winter.  Clean up any spills in your garage right away. This includes oil or grease spills. What can I do in the bathroom?  Use night lights.  Install grab bars by the toilet and in the tub and shower. Do not use towel bars as grab bars.  Use non-skid mats or decals in the tub or shower.  If you need to sit down in the shower, use a plastic, non-slip stool.  Keep the floor dry. Clean up any water that spills on the floor as soon as it happens.  Remove soap buildup in the tub or shower regularly.  Attach bath mats securely with double-sided non-slip rug tape.  Do not have throw rugs and other things on the floor that can make you trip. What can I do in the bedroom?  Use night lights.  Make sure that you have a light by your bed that is easy to reach.  Do not use any sheets or blankets that are too big for your bed. They should not hang down onto the floor.  Have a firm chair that has side arms. You can use this for support while you  get dressed.  Do not have throw rugs and other things on the floor that can make you trip. What can I do in the kitchen?  Clean up any spills right away.  Avoid walking on wet floors.  Keep items that you use a lot in easy-to-reach places.  If you need to reach something above you, use a strong step stool that has a grab bar.  Keep electrical cords out of the way.  Do not use floor polish or wax that makes floors slippery. If you must use wax, use non-skid floor wax.  Do not have throw rugs and other things on the floor that can make you trip. What can I do with my stairs?  Do not leave any items on the stairs.  Make sure that there are handrails on both sides of the stairs and use them. Fix handrails that are broken or loose. Make sure that handrails are as long as the stairways.  Check any carpeting to make sure that it is firmly attached to the stairs. Fix any carpet that is loose or worn.  Avoid having  throw rugs at the top or bottom of the stairs. If you do have throw rugs, attach them to the floor with carpet tape.  Make sure that you have a light switch at the top of the stairs and the bottom of the stairs. If you do not have them, ask someone to add them for you. What else can I do to help prevent falls?  Wear shoes that:  Do not have high heels.  Have rubber bottoms.  Are comfortable and fit you well.  Are closed at the toe. Do not wear sandals.  If you use a stepladder:  Make sure that it is fully opened. Do not climb a closed stepladder.  Make sure that both sides of the stepladder are locked into place.  Ask someone to hold it for you, if possible.  Clearly mark and make sure that you can see:  Any grab bars or handrails.  First and last steps.  Where the edge of each step is.  Use tools that help you move around (mobility aids) if they are needed. These include:  Canes.  Walkers.  Scooters.  Crutches.  Turn on the lights when you go into a dark area. Replace any light bulbs as soon as they burn out.  Set up your furniture so you have a clear path. Avoid moving your furniture around.  If any of your floors are uneven, fix them.  If there are any pets around you, be aware of where they are.  Review your medicines with your doctor. Some medicines can make you feel dizzy. This can increase your chance of falling. Ask your doctor what other things that you can do to help prevent falls. This information is not intended to replace advice given to you by your health care provider. Make sure you discuss any questions you have with your health care provider. Document Released: 07/20/2009 Document Revised: 02/29/2016 Document Reviewed: 10/28/2014 Elsevier Interactive Patient Education  2017 [employer].

## 2017-09-15 ENCOUNTER — Encounter: Payer: PPO | Admitting: Family Medicine

## 2017-09-22 ENCOUNTER — Other Ambulatory Visit: Payer: Self-pay | Admitting: Family Medicine

## 2017-09-27 ENCOUNTER — Ambulatory Visit (INDEPENDENT_AMBULATORY_CARE_PROVIDER_SITE_OTHER): Payer: PPO | Admitting: Family Medicine

## 2017-09-27 ENCOUNTER — Encounter: Payer: Self-pay | Admitting: Family Medicine

## 2017-09-27 ENCOUNTER — Other Ambulatory Visit: Payer: Self-pay

## 2017-09-27 VITALS — BP 128/60 | HR 60 | Temp 98.0°F | Resp 18 | Ht 68.19 in | Wt 292.8 lb

## 2017-09-27 DIAGNOSIS — E7800 Pure hypercholesterolemia, unspecified: Secondary | ICD-10-CM | POA: Diagnosis not present

## 2017-09-27 DIAGNOSIS — I5022 Chronic systolic (congestive) heart failure: Secondary | ICD-10-CM | POA: Diagnosis not present

## 2017-09-27 DIAGNOSIS — I10 Essential (primary) hypertension: Secondary | ICD-10-CM | POA: Diagnosis not present

## 2017-09-27 DIAGNOSIS — I447 Left bundle-branch block, unspecified: Secondary | ICD-10-CM | POA: Diagnosis not present

## 2017-09-27 DIAGNOSIS — Z0000 Encounter for general adult medical examination without abnormal findings: Secondary | ICD-10-CM | POA: Diagnosis not present

## 2017-09-27 DIAGNOSIS — Z23 Encounter for immunization: Secondary | ICD-10-CM

## 2017-09-27 DIAGNOSIS — I2510 Atherosclerotic heart disease of native coronary artery without angina pectoris: Secondary | ICD-10-CM | POA: Diagnosis not present

## 2017-09-27 DIAGNOSIS — E1142 Type 2 diabetes mellitus with diabetic polyneuropathy: Secondary | ICD-10-CM

## 2017-09-27 DIAGNOSIS — G4733 Obstructive sleep apnea (adult) (pediatric): Secondary | ICD-10-CM | POA: Diagnosis not present

## 2017-09-27 LAB — POCT URINALYSIS DIP (MANUAL ENTRY)
BILIRUBIN UA: NEGATIVE
BILIRUBIN UA: NEGATIVE mg/dL
GLUCOSE UA: NEGATIVE mg/dL
Nitrite, UA: NEGATIVE
SPEC GRAV UA: 1.015 (ref 1.010–1.025)
Urobilinogen, UA: 0.2 E.U./dL
pH, UA: 7 (ref 5.0–8.0)

## 2017-09-27 LAB — GLUCOSE, POCT (MANUAL RESULT ENTRY): POC Glucose: 108 mg/dl — AB (ref 70–99)

## 2017-09-27 LAB — POCT GLYCOSYLATED HEMOGLOBIN (HGB A1C): Hemoglobin A1C: 7.7

## 2017-09-27 MED ORDER — ZOSTER VAC RECOMB ADJUVANTED 50 MCG/0.5ML IM SUSR: 0.5000 mL | Freq: Once | INTRAMUSCULAR | 1 refills | Status: AC

## 2017-09-27 NOTE — Progress Notes (Signed)
Subjective:    Patient ID: [name], female    DOB: Sep 06, 1956, 61 y.o.   MRN: [id number]  09/27/2017  Annual Exam    HPI This 61 y.o. female presents for Complete Physical Examination and follow-up of chronic medical conditions.  Last physical:  AWV 09-08-17 Pap smear: hysterectomy Mammogram: 03-2017 Colonoscopy:  2016    Visual Acuity Screening   Right eye Left eye Both eyes  Without correction: 20/20 20/40 20/20   With correction:       BP Readings from Last 3 Encounters:  09/27/17 128/60  09/08/17 106/60  07/29/17 (!) 124/56   Wt Readings from Last 3 Encounters:  09/27/17 292 lb 12.8 oz (132.8 kg)  09/08/17 287 lb 4 oz (130.3 kg)  07/29/17 287 lb (130.2 kg)   Immunization History  Administered Date(s) Administered  . Hepatitis B 03/16/2013  . Hepatitis B, adult 03/30/2014, 07/18/2014  . Influenza Split 07/21/2012  . Influenza,inj,Quad PF,6+ Mos 06/16/2013, 07/07/2014, 06/18/2015, 08/06/2016  . Influenza-Unspecified 07/23/2017  . Pneumococcal Polysaccharide-23 08/08/2011, 06/18/2015, 08/13/2016  . Tdap 03/07/2010, 05/03/2016  . Zoster 04/06/2014   Health Maintenance  Topic Date Due  . OPHTHALMOLOGY EXAM  02/11/2018  . HEMOGLOBIN A1C  03/28/2018  . FOOT EXAM  05/28/2018  . URINE MICROALBUMIN  09/27/2018  . MAMMOGRAM  03/12/2019  . COLONOSCOPY  02/21/2025  . TETANUS/TDAP  05/03/2026  . PNEUMOCOCCAL POLYSACCHARIDE VACCINE  Completed  . INFLUENZA VACCINE  Completed  . Hepatitis C Screening  Completed  . HIV Screening  Completed   DMII with peripheral neuropathy: Patient reports good compliance with medication, good tolerance to medication, and good symptom control.  Fasting sugars well controlled.  Postprandial sugars continue to remain elevated.  Very concerned and afraid of hypoglycemia at nighttime.  Continues to suffer from severe peripheral neuropathy.  Compliance with medications.  Recent follow-up with [name] of neurology in May  2018.  Hypercholesterolemia: Patient reports good compliance with medication, good tolerance to medication, and good symptom control.    Anxiety depression: Continues to suffer with family stressors.  Compliance with citalopram 20 mg daily which benefits mood.  No side effects to medication.  Coronary artery disease: Recent follow-up with Dr. Graciela Husbands in October 2018.  Review of Systems  Constitutional: Negative for activity change, appetite change, chills, diaphoresis, fatigue, fever and unexpected weight change.  HENT: Negative for congestion, dental problem, drooling, ear discharge, ear pain, facial swelling, hearing loss, mouth sores, nosebleeds, postnasal drip, rhinorrhea, sinus pressure, sneezing, sore throat, tinnitus, trouble swallowing and voice change.   Eyes: Negative for photophobia, pain, discharge, redness, itching and visual disturbance.  Respiratory: Negative for apnea, cough, choking, chest tightness, shortness of breath, wheezing and stridor.   Cardiovascular: Negative for chest pain, palpitations and leg swelling.  Gastrointestinal: Negative for abdominal distention, abdominal pain, anal bleeding, blood in stool, constipation, diarrhea, nausea, rectal pain and vomiting.  Endocrine: Negative for cold intolerance, heat intolerance, polydipsia, polyphagia and polyuria.  Genitourinary: Negative for decreased urine volume, difficulty urinating, dyspareunia, dysuria, enuresis, flank pain, frequency, genital sores, hematuria, menstrual problem, pelvic pain, urgency, vaginal bleeding, vaginal discharge and vaginal pain.       Nocturia x 2-3.  Urinary leakage YES.  Depends daily for one year.  Musculoskeletal: Negative for arthralgias, back pain, gait problem, joint swelling, myalgias, neck pain and neck stiffness.  Skin: Negative for color change, pallor, rash and wound.  Allergic/Immunologic: Negative for environmental allergies, food allergies and immunocompromised state.   Neurological: Negative for dizziness, tremors,  seizures, syncope, facial asymmetry, speech difficulty, weakness, light-headedness, numbness and headaches.  Hematological: Negative for adenopathy. Does not bruise/bleed easily.  Psychiatric/Behavioral: Negative for agitation, behavioral problems, confusion, decreased concentration, dysphoric mood, hallucinations, self-injury, sleep disturbance and suicidal ideas. The patient is not nervous/anxious and is not hyperactive.        Bedtime 1200; wakes up 800.    Past Medical History:  Diagnosis Date  . Acute myocardial infarction, subendocardial infarction, initial episode of care (HCC) 07/21/2012  . Acute osteomyelitis involving ankle and foot (HCC) 02/06/2015  . Acute systolic heart failure (HCC) 07/21/2012   New onset 07/19/12; admission to [facility] ED. Elevated Troponins.  S/p 2D-echo with EF 20-25%.  S/p cardiac catheterization with stenting LAD.  Repeat 2D-echo 10/2011 with improved EF of 35%.   . Anemia   . Automatic implantable cardioverter-defibrillator in situ    a. MDT CRT-D 06/2014, SN: [id number]    . CAD (coronary artery disease)    a. cardiac cath 101/04/2012: PCI/DES to chronically occluded mLAD, consideration PCI to diag branch in 4 weeks.   . Cataract   . Chicken pox   . Chronic systolic CHF (congestive heart failure) (HCC)    a. mixed ICM & NICM; b. EF 20-25% by echo 07/2012, mid-dist 2/3 of LV sev HK/AK, mild MR. echo 10/2012: EF 30-35%, sev HK ant-septal & inf walls, GR1DD, mild MR, PASP 33. c. echo 02/2013: EF 30%, GR1DD, mild MR. echo 04/2014: EF 30%, Septal-lat dyssynchrony, global HK, inf AK, GR1DD, mild MR. d. echo 10/2014: EF50-55%, WM nl, GR1DD, septal mild paradox. e. echo 02/2015: EF 50-55%, wm   . Depression   . Heart attack (HCC)   . Heel ulcer (HCC) 04/27/2015  . History of blood transfusion ~ 2011   "plasma; had neuropathy; couldn't walk"  . Hypertension   . LBBB (left bundle branch block)   . Neuromuscular disorder  (HCC)   . Neuropathy 2011  . Obesity, unspecified   . OSA on CPAP    Moderate with AHI 23/hr and now on CPAP at 16cm H2O.  Her DME is [facility]  . Pure hypercholesterolemia   . Type II diabetes mellitus (HCC)   . Unspecified vitamin D deficiency    Past Surgical History:  Procedure Laterality Date  . AMPUTATION TOE Right 06/18/2015   Procedure: AMPUTATION TOE;  Surgeon: Gwyneth Revels, DPM;  Location: [facility] ORS;  Service: Podiatry;  Laterality: Right;  . BACK SURGERY    . BI-VENTRICULAR IMPLANTABLE CARDIOVERTER DEFIBRILLATOR N/A 07/06/2014   Procedure: BI-VENTRICULAR IMPLANTABLE CARDIOVERTER DEFIBRILLATOR  (CRT-D);  Surgeon: Duke Salvia, MD;  Location[facility] CATH LAB;  Service: Cardiovascular;  Laterality: N/A;  . BI-VENTRICULAR IMPLANTABLE CARDIOVERTER DEFIBRILLATOR  (CRT-D)  07/06/2014  . BILATERAL OOPHORECTOMY  01/2011   ovarian cyst benign  . COLONOSCOPY WITH PROPOFOL Left 02/22/2015   Procedure: COLONOSCOPY WITH PROPOFOL;  Surgeon: Wallace Cullens, MD;  Location: [facility] ENDOSCOPY;  Service: Endoscopy;  Laterality: Left;  . CORONARY ANGIOPLASTY WITH STENT PLACEMENT Left 07/2012   new onset systolic CHF; elevated troponins.  Cardiac catheterization with stenting to LAD; EF 15%.  2D-echo: EF 20-25%.  . ESOPHAGOGASTRODUODENOSCOPY N/A 02/22/2015   Procedure: ESOPHAGOGASTRODUODENOSCOPY (EGD);  Surgeon: Wallace Cullens, MD;  Location: [facility] ENDOSCOPY;  Service: Endoscopy;  Laterality: N/A;  . INCISION AND DRAINAGE ABSCESS Right 2007   groin; with ICU stay due to sepsis.  Marland Kitchen LAPAROSCOPIC CHOLECYSTECTOMY  2011  . LEFT HEART CATHETERIZATION WITH CORONARY ANGIOGRAM N/A 07/21/2012   Procedure: LEFT HEART CATHETERIZATION  WITH CORONARY ANGIOGRAM;  Surgeon: Dolores Patty, MD;  Location: [facility] CATH LAB;  Service: Cardiovascular;  Laterality: N/A;  . LUMBAR DISC SURGERY  1996   L4-5  . PERCUTANEOUS CORONARY STENT INTERVENTION (PCI-S) N/A 07/23/2012   Procedure: PERCUTANEOUS CORONARY STENT INTERVENTION (PCI-S);  Surgeon:  Tonny Bollman, MD;  Location: [facility] CATH LAB;  Service: Cardiovascular;  Laterality: N/A;  . PERIPHERAL VASCULAR CATHETERIZATION N/A 02/10/2015   Procedure: Picc Line Insertion;  Surgeon: Renford Dills, MD;  Location: [facility] INVASIVE CV LAB;  Service: Cardiovascular;  Laterality: N/A;  . TUBAL LIGATION  1981  . VAGINAL HYSTERECTOMY  01/2011   Fibroids/DUB.  Ovaries removed. [place].   No Known Allergies Current Outpatient Medications on File Prior to Visit  Medication Sig Dispense Refill  . AMBULATORY NON FORMULARY MEDICATION 1 Units by Other route once. Medication Name: foot brace 1 Units 0  . Calcium Carb-Cholecalciferol (CALCIUM 600 + D PO) Take 1 tablet by mouth 2 (two) times daily.    . clopidogrel (PLAVIX) 75 MG tablet TAKE 1 TABLET BY MOUTH ONCE DAILY 90 tablet 3  . glimepiride (AMARYL) 4 MG tablet TAKE 1 TABLET BY MOUTH TWICE A DAY BEFORE A MEAL. 180 tablet 3  . Insulin Glargine (LANTUS) 100 UNIT/ML Solostar Pen Inject 22 Units into the skin at bedtime. 15 mL 11  . MULTIPLE VITAMIN PO Take 1 tablet by mouth every morning.     . Omega-3 Fatty Acids (FISH OIL) 1000 MG CAPS Take 1,000 mg by mouth 2 (two) times daily.     Marland Kitchen OVER THE COUNTER MEDICATION     . rosuvastatin (CRESTOR) 40 MG tablet Take 1 tablet (40 mg total) by mouth daily. 90 tablet 3  . traMADol (ULTRAM) 50 MG tablet TAKE 1 TABLET EVERY 6 HOURS AS NEEDED FOR PAIN 120 tablet 5  . isosorbide mononitrate (IMDUR) 30 MG 24 hr tablet TAKE 1 TABLET BY MOUTH ONCE DAILY (Patient not taking: Reported on 09/27/2017) 30 tablet 1  . nitroGLYCERIN (NITROSTAT) 0.4 MG SL tablet Place under the tongue. Reported on 04/16/2016     No current facility-administered medications on file prior to visit.    Social History   Socioeconomic History  . Marital status: Married    Spouse name: Molly Maduro  . Number of children: 2  . Years of education: 98  . Highest education level: High school graduate  Social Needs  . Financial resource strain: Not  hard at all  . Food insecurity - worry: Never true  . Food insecurity - inability: Never true  . Transportation needs - medical: No  . Transportation needs - non-medical: No  Occupational History  . Occupation: disabled    Associate Professor: UNEMPLOYED    Comment: 03/2010 for peripheral neuropathy  . 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  Tobacco Use  . Smoking status: Never Smoker  . Smokeless tobacco: Never Used  Substance and Sexual Activity  . Alcohol use: No    Alcohol/week: 0.0 oz  . Drug use: No  . Sexual activity: No    Birth control/protection: Post-menopausal, Surgical  Other Topics Concern  . Not on file  Social History Narrative   Marital status:Married x 45 yrs. Happily married, no abuse.       Children:  2 children (daughter 47, son 51). Two grandsons and 2 step grandchildren.  1 gg.      Lives: with husband, daughter, son-in-law, 2 grandsons, and 1 on the way.  Employment: disability for peripheral neuropathy 2012.  Previously had home daycare.      Tobacco: none      Alcohol: none      Drugs: none      Exercise: none. [employer].      Pets: dog.      Always uses seat belts, smoke detectors in home.      No guns in the home.       Caffeine use: 2 cups coffee per day.       Nutrition: Well balanced diet.   Family History  Problem Relation Age of Onset  . Diabetes Mother   . Hypertension Mother   . Arthritis Mother        knees, lumbar DDD, cervical DDD  . Cancer Father        prostate,skin,lymphoma.  . Cancer Brother 14       bladder cancer; non-smoker  . Diabetes Maternal Grandmother   . Heart disease Maternal Grandmother   . COPD Maternal Grandmother   . Diabetes Paternal Grandmother   . Obesity Brother   . Diabetes Son   . Hypertension Son   . Cancer Maternal Grandfather   . Diabetes Paternal Grandfather        Objective:    BP 128/60 (BP Location: Left Arm, Patient Position: Sitting, Cuff Size: Large)   Pulse 60   Temp 98 F  (36.7 C) (Oral)   Resp 18   Ht 5' 8.19" (1.732 m)   Wt 292 lb 12.8 oz (132.8 kg)   SpO2 98%   BMI 44.27 kg/m  Physical Exam  Constitutional: She is oriented to person, place, and time. She appears well-developed and well-nourished. No distress.  HENT:  Head: Normocephalic and atraumatic.  Right Ear: External ear normal.  Left Ear: External ear normal.  Nose: Nose normal.  Mouth/Throat: Oropharynx is clear and moist.  Eyes: Conjunctivae and EOM are normal. Pupils are equal, round, and reactive to light.  Neck: Normal range of motion. Neck supple. Carotid bruit is not present. No thyromegaly present.  Cardiovascular: Normal rate, regular rhythm, normal heart sounds and intact distal pulses. Exam reveals no gallop and no friction rub.  No murmur heard. Pulmonary/Chest: Effort normal and breath sounds normal. She has no wheezes. She has no rales. Right breast exhibits no inverted nipple, no mass, no nipple discharge, no skin change and no tenderness. Left breast exhibits no inverted nipple, no mass, no nipple discharge, no skin change and no tenderness. Breasts are symmetrical.  Abdominal: Soft. Bowel sounds are normal. She exhibits no distension and no mass. There is no tenderness. There is no rebound and no guarding.  Lymphadenopathy:    She has no cervical adenopathy.  Neurological: She is alert and oriented to person, place, and time. No cranial nerve deficit. She exhibits abnormal muscle tone.  Skin: Skin is warm and dry. No rash noted. She is not diaphoretic. No erythema. No pallor.  Psychiatric: She has a normal mood and affect. Her behavior is normal. Judgment and thought content normal.   No results found. Depression screen [facility] 2/9 09/27/2017 09/08/2017 05/28/2017 02/25/2017 08/13/2016  Decreased Interest 0 0 0 1 0  Down, Depressed, Hopeless 1 0 0 1 1  PHQ - 2 Score 1 0 0 2 1  Altered sleeping 0 - - 0 -  Tired, decreased energy 1 - - 0 -  Change in appetite 3 - - 0 -  Feeling bad  or failure about yourself  1 - -  0 -  Trouble concentrating 0 - - 0 -  Moving slowly or fidgety/restless 0 - - 0 -  Suicidal thoughts 0 - - 0 -  PHQ-9 Score 6 - - 2 -  Difficult doing work/chores Not difficult at all - - - -  Some recent data might be hidden   Fall Risk  09/27/2017 09/08/2017 05/28/2017 02/26/2017 02/25/2017  Falls in the past year? Yes Yes No No No  Number falls in past yr: 1 1 - - -  Injury with Fall? Yes Yes - - -  Comment left elbow Scrapped her elbow and knee - - -  Risk for fall due to : - (No Data) - - -  Risk for fall due to: Comment - Patient loss balance reaching for something and fell. - - -  Follow up - Falls prevention discussed - - -    Results for orders placed or performed in visit on 09/27/17  CBC with Differential/Platelet  Result Value Ref Range   WBC 6.9 3.4 - 10.8 x10E3/uL   RBC 4.07 3.77 - 5.28 x10E6/uL   Hemoglobin 11.9 11.1 - 15.9 g/dL   Hematocrit 16.1 09.6 - 46.6 %   MCV 91 79 - 97 fL   MCH 29.2 26.6 - 33.0 pg   MCHC 32.2 31.5 - 35.7 g/dL   RDW 04.5 40.9 - 81.1 %   Platelets 187 150 - 379 x10E3/uL   Neutrophils 69 Not Estab. %   Lymphs 24 Not Estab. %   Monocytes 5 Not Estab. %   Eos 2 Not Estab. %   Basos 0 Not Estab. %   Neutrophils Absolute 4.8 1.4 - 7.0 x10E3/uL   Lymphocytes Absolute 1.6 0.7 - 3.1 x10E3/uL   Monocytes Absolute 0.4 0.1 - 0.9 x10E3/uL   EOS (ABSOLUTE) 0.1 0.0 - 0.4 x10E3/uL   Basophils Absolute 0.0 0.0 - 0.2 x10E3/uL   Immature Granulocytes 0 Not Estab. %   Immature Grans (Abs) 0.0 0.0 - 0.1 x10E3/uL  Comprehensive metabolic panel  Result Value Ref Range   Glucose 94 65 - 99 mg/dL   BUN 30 (H) 8 - 27 mg/dL   Creatinine, Ser 9.14 (H) 0.57 - 1.00 mg/dL   GFR calc non Af Amer 42 (L) >59 mL/min/1.73   GFR calc Af Amer 48 (L) >59 mL/min/1.73   BUN/Creatinine Ratio 22 12 - 28   Sodium 143 134 - 144 mmol/L   Potassium 5.4 (H) 3.5 - 5.2 mmol/L   Chloride 104 96 - 106 mmol/L   CO2 25 20 - 29 mmol/L   Calcium 8.8  8.7 - 10.3 mg/dL   Total Protein 6.7 6.0 - 8.5 g/dL   Albumin 4.0 3.6 - 4.8 g/dL   Globulin, Total 2.7 1.5 - 4.5 g/dL   Albumin/Globulin Ratio 1.5 1.2 - 2.2   Bilirubin Total 0.3 0.0 - 1.2 mg/dL   Alkaline Phosphatase 92 39 - 117 IU/L   AST 17 0 - 40 IU/L   ALT 13 0 - 32 IU/L  Lipid panel  Result Value Ref Range   Cholesterol, Total 93 (L) 100 - 199 mg/dL   Triglycerides 68 0 - 149 mg/dL   HDL 37 (L) >78 mg/dL   VLDL Cholesterol Cal 14 5 - 40 mg/dL   LDL Calculated 42 0 - 99 mg/dL   Chol/HDL Ratio 2.5 0.0 - 4.4 ratio  TSH  Result Value Ref Range   TSH 1.130 0.450 - 4.500 uIU/mL  Microalbumin / creatinine urine ratio  Result Value Ref Range   Creatinine, Urine 99.4 Not Estab. mg/dL   Albumin, Urine 16.1 Not Estab. ug/mL   Microalb/Creat Ratio 67.4 (H) 0.0 - 30.0 mg/g creat  POCT glucose (manual entry)  Result Value Ref Range   POC Glucose 108 (A) 70 - 99 mg/dl  POCT glycosylated hemoglobin (Hb A1C)  Result Value Ref Range   Hemoglobin A1C 7.7   POCT urinalysis dipstick  Result Value Ref Range   Color, UA yellow yellow   Clarity, UA clear clear   Glucose, UA negative negative mg/dL   Bilirubin, UA negative negative   Ketones, POC UA negative negative mg/dL   Spec Grav, UA 0.960 4.540 - 1.025   Blood, UA trace-intact (A) negative   pH, UA 7.0 5.0 - 8.0   Protein Ur, POC trace (A) negative mg/dL   Urobilinogen, UA 0.2 0.2 or 1.0 E.U./dL   Nitrite, UA Negative Negative   Leukocytes, UA Trace (A) Negative       Assessment & Plan:   1. Routine physical examination   2. CAD in native artery   3. Chronic systolic heart failure (HCC)   4. Essential hypertension, benign   5. Left bundle branch block   6. OSA (obstructive sleep apnea)   7. Diabetic peripheral neuropathy associated with type 2 diabetes mellitus (HCC)   8. Hypercholesterolemia   9. Morbid obesity (HCC)    -anticipatory guidance provided --- exercise, weight loss, safe driving practices, aspirin 81mg   daily. -obtain age appropriate screening labs and labs for chronic disease management. -moderate fall risk; no evidence of depression; no evidence of hearing loss.  Discussed advanced directives and living will; also discussed end of life issues including code status.  -recommend weight loss, exercise for 30-60 minutes five days per week; recommend 1200 kcal restriction per day with a minimum of 60 grams of protein per day. - I recommend weight loss, exercise, and low-carbohydrate low-sugar food choices. You should AVOID: regular sodas, sweetened tea, fruit juices.  You should LIMIT: breads, pastas, rice, potatoes, and desserts/sweets.  I would recommend limiting your total carbohydrate intake per meal to 45 grams; I would limit your total carbohydrate intake per snack to 30 grams.  I would also have a goal of 60 grams of protein intake per day; this would equal 10-15 grams of protein per meal and 5-10 grams of protein per snack.   Orders Placed This Encounter  Procedures  . CBC with Differential/Platelet  . Comprehensive metabolic panel    Order Specific Question:   Has the patient fasted?    Answer:   No  . Lipid panel    Order Specific Question:   Has the patient fasted?    Answer:   No  . TSH  . Microalbumin / creatinine urine ratio  . Microalbumin / creatinine urine ratio  . POCT glucose (manual entry)  . POCT glycosylated hemoglobin (Hb A1C)  . POCT urinalysis dipstick   Meds ordered this encounter  Medications  . Zoster Vaccine Adjuvanted [facility]) injection    Sig: Inject 0.5 mLs into the muscle once for 1 dose.    Dispense:  0.5 mL    Refill:  1  . Insulin Pen Needle (B-D ULTRAFINE III SHORT PEN) 31G X 8 MM MISC    Sig: USE AS DIRECTED WITH LANTUS; dx: DMII uncontrolled, insulin dependent, with complications    Dispense:  100 each    Refill:  11  . citalopram (CELEXA) 20 MG tablet  Sig: Take 1 tablet (20 mg total) by mouth daily.    Dispense:  90 tablet    Refill:  1   . gabapentin (NEURONTIN) 600 MG tablet    Sig: Take 1 tablet (600 mg total) by mouth 3 (three) times daily.    Dispense:  270 tablet    Refill:  1  . glucose blood (ONE TOUCH ULTRA TEST) test strip    Sig: Check sugar three times daily dx: DMII uncontrolled insulin dependent with complications    Dispense:  300 each    Refill:  11    Return in about 3 months (around 12/26/2017) for follow-up chronic medical conditions.   Mak Bonny Paulita Fujita, M.D. Primary Care at [facility] previously [facility] [address] [place], [place]  [address] [phone] phone [phone] fax

## 2017-09-27 NOTE — Patient Instructions (Addendum)
   IF you received an x-ray today, you will receive an invoice from [facility]. Please contact  Radiology at [phone] with questions or concerns regarding your invoice.   IF you received labwork today, you will receive an invoice from [facility]. Please contact [facility] at [phone] with questions or concerns regarding your invoice.   Our billing staff will not be able to assist you with questions regarding bills from these companies.  You will be contacted with the lab results as soon as they are available. The fastest way to get your results is to activate your My Chart account. Instructions are located on the last page of this paperwork. If you have not heard from us regarding the results in 2 weeks, please contact this office.     Preventive Care 40-64 Years, Female Preventive care refers to lifestyle choices and visits with your health care provider that can promote health and wellness. What does preventive care include?  A yearly physical exam. This is also called an annual well check.  Dental exams once or twice a year.  Routine eye exams. Ask your health care provider how often you should have your eyes checked.  Personal lifestyle choices, including: ? Daily care of your teeth and gums. ? Regular physical activity. ? Eating a healthy diet. ? Avoiding tobacco and drug use. ? Limiting alcohol use. ? Practicing safe sex. ? Taking low-dose aspirin daily starting at age 50. ? Taking vitamin and mineral supplements as recommended by your health care provider. What happens during an annual well check? The services and screenings done by your health care provider during your annual well check will depend on your age, overall health, lifestyle risk factors, and family history of disease. Counseling Your health care provider may ask you questions about your:  Alcohol use.  Tobacco use.  Drug use.  Emotional well-being.  Home and relationship  well-being.  Sexual activity.  Eating habits.  Work and work environment.  Method of birth control.  Menstrual cycle.  Pregnancy history.  Screening You may have the following tests or measurements:  Height, weight, and BMI.  Blood pressure.  Lipid and cholesterol levels. These may be checked every 5 years, or more frequently if you are over 50 years old.  Skin check.  Lung cancer screening. You may have this screening every year starting at age 55 if you have a 30-pack-year history of smoking and currently smoke or have quit within the past 15 years.  Fecal occult blood test (FOBT) of the stool. You may have this test every year starting at age 50.  Flexible sigmoidoscopy or colonoscopy. You may have a sigmoidoscopy every 5 years or a colonoscopy every 10 years starting at age 50.  Hepatitis C blood test.  Hepatitis B blood test.  Sexually transmitted disease (STD) testing.  Diabetes screening. This is done by checking your blood sugar (glucose) after you have not eaten for a while (fasting). You may have this done every 1-3 years.  Mammogram. This may be done every 1-2 years. Talk to your health care provider about when you should start having regular mammograms. This may depend on whether you have a family history of breast cancer.  BRCA-related cancer screening. This may be done if you have a family history of breast, ovarian, tubal, or peritoneal cancers.  Pelvic exam and Pap test. This may be done every 3 years starting at age 21. Starting at age 30, this may be done every 5 years if you   have a Pap test in combination with an HPV test.  Bone density scan. This is done to screen for osteoporosis. You may have this scan if you are at high risk for osteoporosis.  Discuss your test results, treatment options, and if necessary, the need for more tests with your health care provider. Vaccines Your health care provider may recommend certain vaccines, such  as:  Influenza vaccine. This is recommended every year.  Tetanus, diphtheria, and acellular pertussis (Tdap, Td) vaccine. You may need a Td booster every 10 years.  Varicella vaccine. You may need this if you have not been vaccinated.  Zoster vaccine. You may need this after age 60.  Measles, mumps, and rubella (MMR) vaccine. You may need at least one dose of MMR if you were born in 1957 or later. You may also need a second dose.  Pneumococcal 13-valent conjugate (PCV13) vaccine. You may need this if you have certain conditions and were not previously vaccinated.  Pneumococcal polysaccharide (PPSV23) vaccine. You may need one or two doses if you smoke cigarettes or if you have certain conditions.  Meningococcal vaccine. You may need this if you have certain conditions.  Hepatitis A vaccine. You may need this if you have certain conditions or if you travel or work in places where you may be exposed to hepatitis A.  Hepatitis B vaccine. You may need this if you have certain conditions or if you travel or work in places where you may be exposed to hepatitis B.  Haemophilus influenzae type b (Hib) vaccine. You may need this if you have certain conditions.  Talk to your health care provider about which screenings and vaccines you need and how often you need them. This information is not intended to replace advice given to you by your health care provider. Make sure you discuss any questions you have with your health care provider. Document Released: 10/20/2015 Document Revised: 06/12/2016 Document Reviewed: 07/25/2015 Elsevier Interactive Patient Education  2018 [employer]  

## 2017-09-28 LAB — MICROALBUMIN / CREATININE URINE RATIO
Creatinine, Urine: 99.4 mg/dL
Microalb/Creat Ratio: 67.4 mg/g creat — ABNORMAL HIGH (ref 0.0–30.0)
Microalbumin, Urine: 67 ug/mL

## 2017-09-28 LAB — COMPREHENSIVE METABOLIC PANEL
ALBUMIN: 4 g/dL (ref 3.6–4.8)
ALK PHOS: 92 IU/L (ref 39–117)
ALT: 13 IU/L (ref 0–32)
AST: 17 IU/L (ref 0–40)
Albumin/Globulin Ratio: 1.5 (ref 1.2–2.2)
BILIRUBIN TOTAL: 0.3 mg/dL (ref 0.0–1.2)
BUN / CREAT RATIO: 22 (ref 12–28)
BUN: 30 mg/dL — AB (ref 8–27)
CHLORIDE: 104 mmol/L (ref 96–106)
CO2: 25 mmol/L (ref 20–29)
Calcium: 8.8 mg/dL (ref 8.7–10.3)
Creatinine, Ser: 1.35 mg/dL — ABNORMAL HIGH (ref 0.57–1.00)
GFR calc Af Amer: 48 mL/min/{1.73_m2} — ABNORMAL LOW (ref >59)
GFR calc non Af Amer: 42 mL/min/{1.73_m2} — ABNORMAL LOW (ref >59)
GLUCOSE: 94 mg/dL (ref 65–99)
Globulin, Total: 2.7 g/dL (ref 1.5–4.5)
Potassium: 5.4 mmol/L — ABNORMAL HIGH (ref 3.5–5.2)
Sodium: 143 mmol/L (ref 134–144)
Total Protein: 6.7 g/dL (ref 6.0–8.5)

## 2017-09-28 LAB — CBC WITH DIFFERENTIAL/PLATELET
BASOS ABS: 0 10*3/uL (ref 0.0–0.2)
Basos: 0 %
EOS (ABSOLUTE): 0.1 10*3/uL (ref 0.0–0.4)
Eos: 2 %
HEMOGLOBIN: 11.9 g/dL (ref 11.1–15.9)
Hematocrit: 36.9 % (ref 34.0–46.6)
Immature Grans (Abs): 0 10*3/uL (ref 0.0–0.1)
Immature Granulocytes: 0 %
LYMPHS ABS: 1.6 10*3/uL (ref 0.7–3.1)
Lymphs: 24 %
MCH: 29.2 pg (ref 26.6–33.0)
MCHC: 32.2 g/dL (ref 31.5–35.7)
MCV: 91 fL (ref 79–97)
MONOS ABS: 0.4 10*3/uL (ref 0.1–0.9)
Monocytes: 5 %
NEUTROS ABS: 4.8 10*3/uL (ref 1.4–7.0)
Neutrophils: 69 %
PLATELETS: 187 10*3/uL (ref 150–379)
RBC: 4.07 x10E6/uL (ref 3.77–5.28)
RDW: 13.2 % (ref 12.3–15.4)
WBC: 6.9 10*3/uL (ref 3.4–10.8)

## 2017-09-28 LAB — LIPID PANEL
CHOLESTEROL TOTAL: 93 mg/dL — AB (ref 100–199)
Chol/HDL Ratio: 2.5 ratio (ref 0.0–4.4)
HDL: 37 mg/dL — AB (ref >39)
LDL CALC: 42 mg/dL (ref 0–99)
TRIGLYCERIDES: 68 mg/dL (ref 0–149)
VLDL CHOLESTEROL CAL: 14 mg/dL (ref 5–40)

## 2017-09-28 LAB — TSH: TSH: 1.13 u[IU]/mL (ref 0.450–4.500)

## 2017-10-03 ENCOUNTER — Other Ambulatory Visit: Payer: Self-pay | Admitting: Cardiovascular Disease

## 2017-10-03 DIAGNOSIS — I5021 Acute systolic (congestive) heart failure: Secondary | ICD-10-CM

## 2017-10-20 MED ORDER — CITALOPRAM HYDROBROMIDE 20 MG PO TABS: 20.0000 mg | ORAL_TABLET | Freq: Every day | ORAL | 1 refills | Status: DC

## 2017-10-20 MED ORDER — GLUCOSE BLOOD VI STRP: ORAL_STRIP | 11 refills | Status: DC

## 2017-10-20 MED ORDER — INSULIN PEN NEEDLE 31G X 8 MM MISC: 11 refills | Status: DC

## 2017-10-20 MED ORDER — GABAPENTIN 600 MG PO TABS: 600.0000 mg | ORAL_TABLET | Freq: Three times a day (TID) | ORAL | 1 refills | Status: DC

## 2017-10-21 ENCOUNTER — Ambulatory Visit (INDEPENDENT_AMBULATORY_CARE_PROVIDER_SITE_OTHER): Payer: PPO | Admitting: *Deleted

## 2017-10-21 ENCOUNTER — Telehealth: Payer: Self-pay | Admitting: Cardiology

## 2017-10-21 DIAGNOSIS — I255 Ischemic cardiomyopathy: Secondary | ICD-10-CM

## 2017-10-21 DIAGNOSIS — I5022 Chronic systolic (congestive) heart failure: Secondary | ICD-10-CM

## 2017-10-21 NOTE — Telephone Encounter (Signed)
Spoke with pt and reminded pt of remote transmission that is due today. Pt verbalized understanding.   

## 2017-10-22 NOTE — Progress Notes (Signed)
Remote ICD transmission.   

## 2017-10-23 LAB — CUP PACEART REMOTE DEVICE CHECK
Battery Remaining Longevity: 51 mo
Battery Voltage: 2.96 V
Brady Statistic RA Percent Paced: 37.77 %
Brady Statistic RV Percent Paced: 99.71 %
HighPow Impedance: 71 Ohm
Implantable Lead Implant Date: 20150930
Implantable Lead Implant Date: 20150930
Implantable Lead Location: 753857
Implantable Lead Model: 4398
Lead Channel Impedance Value: 342 Ohm
Lead Channel Impedance Value: 380 Ohm
Lead Channel Impedance Value: 399 Ohm
Lead Channel Impedance Value: 532 Ohm
Lead Channel Impedance Value: 570 Ohm
Lead Channel Impedance Value: 570 Ohm
Lead Channel Pacing Threshold Pulse Width: 0.4 ms
Lead Channel Sensing Intrinsic Amplitude: 2 mV
Lead Channel Sensing Intrinsic Amplitude: 31.375 mV
Lead Channel Sensing Intrinsic Amplitude: 31.375 mV
Lead Channel Setting Pacing Amplitude: 2 V
Lead Channel Setting Pacing Pulse Width: 0.4 ms
Lead Channel Setting Pacing Pulse Width: 0.4 ms
MDC IDC LEAD IMPLANT DT: 20150930
MDC IDC LEAD LOCATION: 753859
MDC IDC LEAD LOCATION: 753860
MDC IDC MSMT LEADCHNL LV IMPEDANCE VALUE: 323 Ohm
MDC IDC MSMT LEADCHNL LV IMPEDANCE VALUE: 342 Ohm
MDC IDC MSMT LEADCHNL LV IMPEDANCE VALUE: 589 Ohm
MDC IDC MSMT LEADCHNL LV IMPEDANCE VALUE: 627 Ohm
MDC IDC MSMT LEADCHNL LV IMPEDANCE VALUE: 646 Ohm
MDC IDC MSMT LEADCHNL LV PACING THRESHOLD AMPLITUDE: 0.75 V
MDC IDC MSMT LEADCHNL RA IMPEDANCE VALUE: 380 Ohm
MDC IDC MSMT LEADCHNL RA PACING THRESHOLD AMPLITUDE: 0.5 V
MDC IDC MSMT LEADCHNL RA PACING THRESHOLD PULSEWIDTH: 0.4 ms
MDC IDC MSMT LEADCHNL RA SENSING INTR AMPL: 2 mV
MDC IDC MSMT LEADCHNL RV IMPEDANCE VALUE: 399 Ohm
MDC IDC MSMT LEADCHNL RV PACING THRESHOLD AMPLITUDE: 0.625 V
MDC IDC MSMT LEADCHNL RV PACING THRESHOLD PULSEWIDTH: 0.4 ms
MDC IDC PG IMPLANT DT: 20150930
MDC IDC SESS DTM: 20190116063826
MDC IDC SET LEADCHNL RA PACING AMPLITUDE: 1.5 V
MDC IDC SET LEADCHNL RV PACING AMPLITUDE: 2 V
MDC IDC SET LEADCHNL RV SENSING SENSITIVITY: 0.3 mV
MDC IDC STAT BRADY AP VP PERCENT: 37.85 %
MDC IDC STAT BRADY AP VS PERCENT: 0.02 %
MDC IDC STAT BRADY AS VP PERCENT: 62.1 %
MDC IDC STAT BRADY AS VS PERCENT: 0.03 %

## 2017-10-24 ENCOUNTER — Encounter: Payer: Self-pay | Admitting: Cardiology

## 2017-10-30 DIAGNOSIS — N183 Chronic kidney disease, stage 3 (moderate): Secondary | ICD-10-CM | POA: Diagnosis not present

## 2017-10-30 DIAGNOSIS — R809 Proteinuria, unspecified: Secondary | ICD-10-CM | POA: Diagnosis not present

## 2017-10-30 DIAGNOSIS — E1122 Type 2 diabetes mellitus with diabetic chronic kidney disease: Secondary | ICD-10-CM | POA: Diagnosis not present

## 2017-10-30 DIAGNOSIS — I129 Hypertensive chronic kidney disease with stage 1 through stage 4 chronic kidney disease, or unspecified chronic kidney disease: Secondary | ICD-10-CM | POA: Diagnosis not present

## 2017-10-30 DIAGNOSIS — E875 Hyperkalemia: Secondary | ICD-10-CM | POA: Diagnosis not present

## 2017-11-02 NOTE — Telephone Encounter (Signed)
This encounter was created in error - please disregard.

## 2017-12-01 ENCOUNTER — Encounter: Payer: Self-pay | Admitting: Neurology

## 2017-12-01 ENCOUNTER — Ambulatory Visit (INDEPENDENT_AMBULATORY_CARE_PROVIDER_SITE_OTHER): Payer: PPO | Admitting: Neurology

## 2017-12-01 VITALS — BP 90/60 | HR 64 | Ht 68.0 in

## 2017-12-01 DIAGNOSIS — G8332 Monoplegia, unspecified affecting left dominant side: Secondary | ICD-10-CM

## 2017-12-01 DIAGNOSIS — I639 Cerebral infarction, unspecified: Secondary | ICD-10-CM | POA: Diagnosis not present

## 2017-12-01 DIAGNOSIS — E0842 Diabetes mellitus due to underlying condition with diabetic polyneuropathy: Secondary | ICD-10-CM

## 2017-12-01 NOTE — Progress Notes (Signed)
Follow-up Visit   Date: 12/01/17    [name] MRN: [id number] DOB: 03/21/1956   Interim History: [name] is a 62 y.o. right-handed Caucasian female with diabetes mellitus type 2 since age of 28 complicated by neuropathy, left leg diabetic amyotrophy (2011), CKD, and CHF s/p ICD returning to the clinic for follow-up of stroke and neuropathy. The patient was accompanied to the clinic by self.  History of present illness: Patient has a history of diabetic amyotrophy involving the left lower extremity 2011 (HbA1c 15, + 130lb weight loss in 1 year). She was under the care of Dr. Erling Cruz at [facility]. Slowly, she developed left leg weakness, falls, and numbness. Imaging showed multilevel spondylosis as well as postsurgical L4-5 and L5-S1 left hemilaminectomy and discectomy. Electrodiagnostic showed profound denervation involving the vastus medialis and iliopsoas muscles in addition to sensory abnormalities, suggesting diabetic amyotrophy. She underwent treatment with IVIG and was ultimately discharged to rehabilitation facility. Over the next 8 months, she continued to do rehabilitation and was eventually able to walk independently again, but using a walker as needed.  Patient was reportedly doing well until February 2016 when she slowly started developing painless left foot drop and bilateral proximal leg weakness. Prior to February, she was walking independently, but now is dependent for gait and transfers.  In mid-April, she developed weakness of the left last two fingers, where she is unable to extend the 4th and 5th digits on the left. She has underwent a battery of tests including serum, CSF testing, and EMGs.  There was evidence of sensorimotor neuropathy as well as polyradiculoneuropathy.  CSF testing showed slightly elevated protein, otherwise was normal.  More notable was that her inflammatory markers, ANA, and p-ANCA returned elevated.  Without any  treatment, the strength of her legs has significantly improved and over the past month, she has been walking independently.  Her left hand remains contracted which limits her ability to use it. She has been driving since October and able to control the pedal without difficulty.  She had residual left foot drop from 2011, but overall feels back to herself.    In February 2017, she had a stroke manifesting with left leg>arm/face numbness and weakness (NIHSS 2).  CT showed a small right centrum semiovale infarct.  Intracranial imaging was not performed and her echo was suboptimal.  US carotids showed <50% stenosis bilaterally.  Medical management was optimized by adding plavix 34m to aspirin 871mwhich she was already taking x 3 months, then continued on monotherapy with plavix 7513m Statin therapy was added by her PCP.  She no longer has numbness of the face and arm, but has mild residual numbness of the left leg and foot.  She has been compliant about using her AFO and sustained one fall.    UPDATE 12/01/2017:  She is here for 9 month follow-up visit. She has been doing well and does not have any new complaints.  She continues to have left foot drop and has been noncompliant with using her AFO. She has not suffered any falls.  Her diabetes is stable with last HbAc ~7.  Her weight has been increasing, which she is struggling with.   Medications:  Current Outpatient Medications on File Prior to Visit  Medication Sig Dispense Refill  . AMBULATORY NON FORMULARY MEDICATION 1 Units by Other route once. Medication Name: foot brace 1 Units 0  . Calcium Carb-Cholecalciferol (CALCIUM 600 + D PO) Take 1 tablet by  mouth 2 (two) times daily.    . carvedilol (COREG) 3.125 MG tablet TAKE 1 TABLET BY MOUTH TWICE (2) DAILY 60 tablet 2  . citalopram (CELEXA) 20 MG tablet Take 1 tablet (20 mg total) by mouth daily. 90 tablet 1  . clopidogrel (PLAVIX) 75 MG tablet TAKE 1 TABLET BY MOUTH ONCE DAILY 90 tablet 3  .  gabapentin (NEURONTIN) 600 MG tablet Take 1 tablet (600 mg total) by mouth 3 (three) times daily. 270 tablet 1  . glimepiride (AMARYL) 4 MG tablet TAKE 1 TABLET BY MOUTH TWICE A DAY BEFORE A MEAL. 180 tablet 3  . glucose blood (ONE TOUCH ULTRA TEST) test strip Check sugar three times daily dx: DMII uncontrolled insulin dependent with complications 782 each 11  . Insulin Glargine (LANTUS) 100 UNIT/ML Solostar Pen Inject 22 Units into the skin at bedtime. 15 mL 11  . Insulin Pen Needle (B-D ULTRAFINE III SHORT PEN) 31G X 8 MM MISC USE AS DIRECTED WITH LANTUS; dx: DMII uncontrolled, insulin dependent, with complications 956 each 11  . isosorbide mononitrate (IMDUR) 30 MG 24 hr tablet TAKE 1 TABLET BY MOUTH ONCE DAILY 30 tablet 1  . MULTIPLE VITAMIN PO Take 1 tablet by mouth every morning.     . nitroGLYCERIN (NITROSTAT) 0.4 MG SL tablet Place under the tongue. Reported on 04/16/2016    . Omega-3 Fatty Acids (FISH OIL) 1000 MG CAPS Take 1,000 mg by mouth 2 (two) times daily.     Marland Kitchen OVER THE COUNTER MEDICATION     . rosuvastatin (CRESTOR) 40 MG tablet Take 1 tablet (40 mg total) by mouth daily. 90 tablet 3  . torsemide (DEMADEX) 20 MG tablet TAKE 1 TABLET BY MOUTH ONCE A DAY 30 tablet 1  . traMADol (ULTRAM) 50 MG tablet TAKE 1 TABLET EVERY 6 HOURS AS NEEDED FOR PAIN 120 tablet 5   No current facility-administered medications on file prior to visit.     Allergies: No Known Allergies  Review of Systems:  CONSTITUTIONAL: No fevers, chills, night sweats, or weight loss.  EYES: No visual changes or eye pain ENT: No hearing changes.  No history of nose bleeds.   RESPIRATORY: No cough, wheezing and shortness of breath.   CARDIOVASCULAR: Negative for chest pain, and palpitations.   GI: Negative for abdominal discomfort, blood in stools or black stools.  No recent change in bowel habits.   GU:  No history of incontinence.   MUSCLOSKELETAL: +history of joint pain or swelling.  No myalgias.   SKIN:  Negative for lesions, rash, and itching.   ENDOCRINE: Negative for cold or heat intolerance, polydipsia or goiter.   PSYCH:  No depression or anxiety symptoms.   NEURO: As Above.   Vital Signs:  BP 90/60   Pulse 64   Ht _0  (1.727 m)   SpO2 97%   BMI 44.52 kg/m   Neurological Exam: MENTAL STATUS including orientation to time, place, person, recent and remote memory, attention span and concentration, language, and fund of knowledge is normal.  .  CRANIAL NERVES:   Pupils are round and reactive. Extraocular muscles intact.  Face is symmetric.  Palate elevates symmetrically.   MOTOR:  Left TA atrophy (stable).  No drift, normal tone.   Right Upper Extremity:    Left Upper Extremity:    Deltoid  5/5   Deltoid  5/5   Biceps  5/5   Biceps  5/5   Triceps  5/5   Triceps  5/5  Wrist extensors  5/5   Wrist extensors  5/5   Wrist flexors  5/5   Wrist flexors  5/5   Finger extensors  5/5   Finger extensors * Limited by flexion contracture of the 3-5th digits at the MCP and PIP 4+/5   Finger flexors  5/5   Finger flexors  5/5   Dorsal interossei  5/5   Dorsal interossei  5/5   Abductor pollicis  5/5   Abductor pollicis  5/5   Tone (Ashworth scale)  0  Tone (Ashworth scale)  0   Right Lower Extremity:    Left Lower Extremity:    Hip flexors  5/5   Hip flexors  5/5   Hip extensors  5/5   Hip extensors  5/5   Knee flexors  5/5   Knee flexors  5/5   Knee extensors  5/5   Knee extensors  5/5   Dorsiflexors  5/5   Dorsiflexors  2/5   Plantarflexors  5/5   Plantarflexors  3/5   Toe extensors  5/5   Toe extensors  2/5   Toe flexors  5/5   Toe flexors  2/5   Tone (Ashworth scale)  0  Tone (Ashworth scale)  0   SENSORY: Vibration and temperature absent at ankles bilaterally, intact at knees.     COORDINATION/GAIT:  Gait is unassisted with high steppage on the left, stable.   Data: MRI lumbar spine 05/17/2010: 1. Unchanged MRI from 03/22/2010. No epidural abscess is identified. No  compression fracture or acute abnormality. 2. Postoperative changes on the left at L4-L5 and L5-S1. 3. Unchanged left paracentral and posterolateral shallow disc protrusion at L5-S1 potentially affecting the descending left S1 nerve root.  EMG of the right lower extremity 03/28/2015: The electrophysiologic findings are most consistent with a severe generalized sensorimotor polyneuropathy, axon loss in type, affecting the right lower extremity. A superimposed multilevel intraspinal canal lesion (i.e. radiculopathy) cannot be excluded.  EMG of the upper extremities 03/21/2015:  The electrophysiologic findings are most consistent with a severe subacute sensorimotor polyradiculoneuropathy affecting the upper extremities  Labs 01/22/2015:  SPEP/UPEP with IFE no M protein, ANA 1:640* homogenous pattern, P-ANCA 1:640*, RF < 10, ACE 46, copper 131, ceruloplasmin 31, ESR 65, vitamin B1 17, CK 24, ENA negative, CRP 15.2*, cryoglobulins none detected, aldolase 8.0  CSF 04/28/2015:  W1  R0  G73  P63*   Cryptococcus neg, ACE 11, one OCB in CSF and serum, MBP <2.0, Lyme neg, IgG index 0.45  Lab Results  Component Value Date   HGBA1C 7.7 09/27/2017   US carotids 11/24/2015:  Mild atherosclerotic disease in the carotid arteries bilaterally. Estimated degree of stenosis in the internal carotid arteries is less than 50% bilaterally. Patent vertebral arteries.  Left thyroid nodule measuring up to 2.2 cm. This nodule meets criteria for biopsy. Recommend dedicated ultrasound of the thyroid to look for additional nodules and consider biopsy of the dominant left thyroid nodule.   CT head wo contrast 11/11/2015:  Small rounded focal low density seen in right centrum semiovale concerning for focal infarction of indeterminate age. These results were called by telephone at the time of interpretation on 11/11/2015 at 9:49 am to Dr. Lavonia Drafts , who verbally acknowledged these results.  Lab Results  Component Value Date    CHOL 93 (L) 09/27/2017   HDL 37 (L) 09/27/2017   LDLCALC 42 09/27/2017   TRIG 68 09/27/2017   CHOLHDL 2.5 09/27/2017    IMPRESSION/PLAN: 1.  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  - continue secondary risk prevention with plavix 22m daily and statin therapy. BP is low today, encouraged her to stay well hydrated  - encouraged lifestyle changes with diet and exercise to better control diabetes  - If any new events occur, she will need extended cardiac monitoring  2.  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  Left foot drop due to known history of stroke and diabetic amyotrophy.  Encouraged compliance with AFO to minimize risk of falls.  Return to clinic in 1 year  Greater than 50% of this 20 minute visit was spent in counseling, explanation of diagnosis, planning of further management, and coordination of care.    Thank you for allowing me to participate in patient's care.  If I can answer any additional questions, I would be pleased to do so.    Sincerely,    Donika K. PaPosey ProntoDO

## 2017-12-01 NOTE — Patient Instructions (Signed)
Return to clinic in 1 year.

## 2017-12-16 ENCOUNTER — Other Ambulatory Visit: Payer: Self-pay | Admitting: Cardiovascular Disease

## 2017-12-29 ENCOUNTER — Ambulatory Visit: Payer: PPO | Admitting: Family Medicine

## 2018-01-02 ENCOUNTER — Other Ambulatory Visit: Payer: Self-pay | Admitting: Internal Medicine

## 2018-01-02 ENCOUNTER — Other Ambulatory Visit: Payer: Self-pay | Admitting: Family Medicine

## 2018-01-02 NOTE — Telephone Encounter (Signed)
Tramadol refill request - controlled substance  LOV 09/27/17 with Dr. Melony [facility] - [place], [place] [address].

## 2018-01-02 NOTE — Telephone Encounter (Signed)
Tramadol refill req sent to Dr. Tamala Julian

## 2018-01-10 ENCOUNTER — Ambulatory Visit: Payer: PPO | Admitting: Family Medicine

## 2018-01-12 ENCOUNTER — Encounter: Payer: Self-pay | Admitting: Family Medicine

## 2018-01-12 ENCOUNTER — Ambulatory Visit (INDEPENDENT_AMBULATORY_CARE_PROVIDER_SITE_OTHER): Payer: PPO | Admitting: Family Medicine

## 2018-01-12 ENCOUNTER — Other Ambulatory Visit: Payer: Self-pay

## 2018-01-12 VITALS — BP 126/78 | HR 97 | Temp 98.6°F | Ht 68.31 in | Wt 283.0 lb

## 2018-01-12 DIAGNOSIS — Z8673 Personal history of transient ischemic attack (TIA), and cerebral infarction without residual deficits: Secondary | ICD-10-CM

## 2018-01-12 DIAGNOSIS — I480 Paroxysmal atrial fibrillation: Secondary | ICD-10-CM | POA: Diagnosis not present

## 2018-01-12 DIAGNOSIS — E7800 Pure hypercholesterolemia, unspecified: Secondary | ICD-10-CM

## 2018-01-12 DIAGNOSIS — I2510 Atherosclerotic heart disease of native coronary artery without angina pectoris: Secondary | ICD-10-CM

## 2018-01-12 DIAGNOSIS — G610 Guillain-Barre syndrome: Secondary | ICD-10-CM

## 2018-01-12 DIAGNOSIS — I10 Essential (primary) hypertension: Secondary | ICD-10-CM

## 2018-01-12 DIAGNOSIS — N183 Chronic kidney disease, stage 3 unspecified: Secondary | ICD-10-CM

## 2018-01-12 DIAGNOSIS — E1142 Type 2 diabetes mellitus with diabetic polyneuropathy: Secondary | ICD-10-CM | POA: Diagnosis not present

## 2018-01-12 DIAGNOSIS — G8220 Paraplegia, unspecified: Secondary | ICD-10-CM

## 2018-01-12 DIAGNOSIS — I5022 Chronic systolic (congestive) heart failure: Secondary | ICD-10-CM

## 2018-01-12 LAB — GLUCOSE, POCT (MANUAL RESULT ENTRY): POC GLUCOSE: 225 mg/dL — AB (ref 70–99)

## 2018-01-12 MED ORDER — GLIMEPIRIDE 4 MG PO TABS: 4.0000 mg | ORAL_TABLET | Freq: Two times a day (BID) | ORAL | 3 refills | Status: DC

## 2018-01-12 MED ORDER — INSULIN GLARGINE 100 UNIT/ML SOLOSTAR PEN: 22.0000 [IU] | PEN_INJECTOR | Freq: Every day | SUBCUTANEOUS | 11 refills | Status: DC

## 2018-01-12 MED ORDER — TORSEMIDE 20 MG PO TABS: 20.0000 mg | ORAL_TABLET | Freq: Every day | ORAL | 1 refills | Status: DC

## 2018-01-12 NOTE — Patient Instructions (Addendum)
  CHANGE GLIMEPERIDE 4MG  TO ONE TABLET TWICE DAILY WITH FOODS.   IF you received an x-ray today, you will receive an invoice from [facility]. Please contact [facility] at [phone] with questions or concerns regarding your invoice.   IF you received labwork today, you will receive an invoice from [facility]. Please contact [facility] at [phone] with questions or concerns regarding your invoice.   Our billing staff will not be able to assist you with questions regarding bills from these companies.  You will be contacted with the lab results as soon as they are available. The fastest way to get your results is to activate your My Chart account. Instructions are located on the last page of this paperwork. If you have not heard from [place] regarding the results in 2 weeks, please contact this office.

## 2018-01-12 NOTE — Progress Notes (Signed)
Subjective:    Patient ID: [name], female    DOB: [birth year], 62 y.o.   MRN: [id number]  01/12/2018  Follow-up (Diabetes follow up) and Medication Refill (needing more Lantus)    HPI This 62 y.o. female presents for four-month follow-up of diabetes mellitus type 2, hypercholesterolemia, coronary artery disease, chronic renal insufficiency.   Lantus 16-28 units.   Taking Amaryl.  Rare lows at this time.   Due for follow-up with Ar[facility]. Saw nephrologist one month ago.    BP Readings from Last 3 Encounters:  02/24/18 111/80  02/24/18 (!) 68/40  01/12/18 126/78   Wt Readings from Last 3 Encounters:  02/24/18 284 lb 12.8 oz (129.2 kg)  01/12/18 283 lb (128.4 kg)  09/27/17 292 lb 12.8 oz (132.8 kg)   Immunization History  Administered Date(s) Administered  . Hepatitis B 03/16/2013  . Hepatitis B, adult 03/30/2014, 07/18/2014  . Influenza Split 07/21/2012  . Influenza,inj,Quad PF,6+ Mos 06/16/2013, 07/07/2014, 06/18/2015, 08/06/2016  . Influenza-Unspecified 07/23/2017  . Pneumococcal Polysaccharide-23 08/08/2011, 06/18/2015, 08/13/2016  . Tdap 03/07/2010, 05/03/2016  . Zoster 04/06/2014    Review of Systems  Constitutional: Negative for chills, diaphoresis, fatigue and fever.  Eyes: Negative for visual disturbance.  Respiratory: Negative for cough and shortness of breath.   Cardiovascular: Negative for chest pain, palpitations and leg swelling.  Gastrointestinal: Negative for abdominal pain, constipation, diarrhea, nausea and vomiting.  Endocrine: Negative for cold intolerance, heat intolerance, polydipsia, polyphagia and polyuria.  Skin: Negative for color change, rash and wound.  Neurological: Positive for numbness. Negative for dizziness, tremors, seizures, syncope, facial asymmetry, speech difficulty, weakness, light-headedness and headaches.  Psychiatric/Behavioral: Negative for dysphoric mood, self-injury, sleep disturbance and suicidal ideas. The patient is  not nervous/anxious.     Past Medical History:  Diagnosis Date  . Acute myocardial infarction, subendocardial infarction, initial episode of care (HCC) 07/21/2012  . Acute osteomyelitis involving ankle and foot (HCC) 02/06/2015  . Acute systolic heart failure (HCC) 07/21/2012   New onset 07/19/12; admission to [facility] ED. Elevated Troponins.  S/p 2D-echo with EF 20-25%.  S/p cardiac catheterization with stenting LAD.  Repeat 2D-echo 10/2011 with improved EF of 35%.   . Anemia   . Automatic implantable cardioverter-defibrillator in situ    a. MDT CRT-D 06/2014, SN: [id number]    . CAD (coronary artery disease)    a. cardiac cath 101/04/2012: PCI/DES to chronically occluded mLAD, consideration PCI to diag branch in 4 weeks.   . Cataract   . Chicken pox   . Chronic systolic CHF (congestive heart failure) (HCC)    a. mixed ICM & NICM; b. EF 20-25% by echo 07/2012, mid-dist 2/3 of LV sev HK/AK, mild MR. echo 10/2012: EF 30-35%, sev HK ant-septal & inf walls, GR1DD, mild MR, PASP 33. c. echo 02/2013: EF 30%, GR1DD, mild MR. echo 04/2014: EF 30%, Septal-lat dyssynchrony, global HK, inf AK, GR1DD, mild MR. d. echo 10/2014: EF50-55%, WM nl, GR1DD, septal mild paradox. e. echo 02/2015: EF 50-55%, wm   . Depression   . Heart attack (HCC)   . Heel ulcer (HCC) 04/27/2015  . History of blood transfusion ~ 2011   "plasma; had neuropathy; couldn't walk"  . Hypertension   . LBBB (left bundle branch block)   . Neuromuscular disorder (HCC)   . Neuropathy 2011  . Obesity, unspecified   . OSA on CPAP    Moderate with AHI 23/hr and now on CPAP at 16cm H2O.  Her DME is [facility]  .  Pure hypercholesterolemia   . Type II diabetes mellitus (HCC)   . Unspecified vitamin D deficiency    Past Surgical History:  Procedure Laterality Date  . AMPUTATION TOE Right 06/18/2015   Procedure: AMPUTATION TOE;  Surgeon: Gwyneth Revels, DPM;  Location: [facility] ORS;  Service: Podiatry;  Laterality: Right;  . BACK SURGERY    .  BI-VENTRICULAR IMPLANTABLE CARDIOVERTER DEFIBRILLATOR N/A 07/06/2014   Procedure: BI-VENTRICULAR IMPLANTABLE CARDIOVERTER DEFIBRILLATOR  (CRT-D);  Surgeon: Duke Salvia, MD;  Location: [facility] CATH LAB;  Service: Cardiovascular;  Laterality: N/A;  . BI-VENTRICULAR IMPLANTABLE CARDIOVERTER DEFIBRILLATOR  (CRT-D)  07/06/2014  . BILATERAL OOPHORECTOMY  01/2011   ovarian cyst benign  . COLONOSCOPY WITH PROPOFOL Left 02/22/2015   Procedure: COLONOSCOPY WITH PROPOFOL;  Surgeon: Wallace Cullens, MD;  Location: [facility] ENDOSCOPY;  Service: Endoscopy;  Laterality: Left;  . CORONARY ANGIOPLASTY WITH STENT PLACEMENT Left 07/2012   new onset systolic CHF; elevated troponins.  Cardiac catheterization with stenting to LAD; EF 15%.  2D-echo: EF 20-25%.  . ESOPHAGOGASTRODUODENOSCOPY N/A 02/22/2015   Procedure: ESOPHAGOGASTRODUODENOSCOPY (EGD);  Surgeon: Wallace Cullens, MD;  Location: [facility] ENDOSCOPY;  Service: Endoscopy;  Laterality: N/A;  . INCISION AND DRAINAGE ABSCESS Right 2007   groin; with ICU stay due to sepsis.  Marland Kitchen LAPAROSCOPIC CHOLECYSTECTOMY  2011  . LEFT HEART CATHETERIZATION WITH CORONARY ANGIOGRAM N/A 07/21/2012   Procedure: LEFT HEART CATHETERIZATION WITH CORONARY ANGIOGRAM;  Surgeon: Dolores Patty, MD;  Location: [facility] CATH LAB;  Service: Cardiovascular;  Laterality: N/A;  . LUMBAR DISC SURGERY  1996   L4-5  . PERCUTANEOUS CORONARY STENT INTERVENTION (PCI-S) N/A 07/23/2012   Procedure: PERCUTANEOUS CORONARY STENT INTERVENTION (PCI-S);  Surgeon: Tonny Bollman, MD;  Location: [facility] CATH LAB;  Service: Cardiovascular;  Laterality: N/A;  . PERIPHERAL VASCULAR CATHETERIZATION N/A 02/10/2015   Procedure: Picc Line Insertion;  Surgeon: Renford Dills, MD;  Location: [facility] INVASIVE CV LAB;  Service: Cardiovascular;  Laterality: N/A;  . TUBAL LIGATION  1981  . VAGINAL HYSTERECTOMY  01/2011   Fibroids/DUB.  Ovaries removed. [place].   No Known Allergies Current Outpatient Medications on File Prior to Visit  Medication  Sig Dispense Refill  . AMBULATORY NON FORMULARY MEDICATION 1 Units by Other route once. Medication Name: foot brace (Patient not taking: Reported on 02/24/2018) 1 Units 0  . Calcium Carb-Cholecalciferol (CALCIUM 600 + D PO) Take 1 tablet by mouth 2 (two) times daily.    . carvedilol (COREG) 3.125 MG tablet TAKE 1 TABLET BY MOUTH TWICE (2) DAILY 60 tablet 2  . citalopram (CELEXA) 20 MG tablet Take 1 tablet (20 mg total) by mouth daily. 90 tablet 1  . clopidogrel (PLAVIX) 75 MG tablet TAKE 1 TABLET BY MOUTH ONCE DAILY 90 tablet 3  . gabapentin (NEURONTIN) 600 MG tablet Take 1 tablet (600 mg total) by mouth 3 (three) times daily. 270 tablet 1  . glucose blood (ONE TOUCH ULTRA TEST) test strip Check sugar three times daily dx: DMII uncontrolled insulin dependent with complications 300 each 11  . Insulin Pen Needle (B-D ULTRAFINE III SHORT PEN) 31G X 8 MM MISC USE AS DIRECTED WITH LANTUS; dx: DMII uncontrolled, insulin dependent, with complications 100 each 11  . isosorbide mononitrate (IMDUR) 30 MG 24 hr tablet TAKE 1 TABLET BY MOUTH ONCE DAILY (Patient not taking: Reported on 02/24/2018) 30 tablet 1  . Omega-3 Fatty Acids (FISH OIL) 1000 MG CAPS Take 1,000 mg by mouth 2 (two) times daily.     . rosuvastatin (CRESTOR) 40  MG tablet TAKE 1 TABLET BY MOUTH ONCE DAILY 90 tablet 3  . traMADol (ULTRAM) 50 MG tablet TAKE 1 TABLET BY MOUTH EVERY 6 HOURS AS NEEDED FOR PAIN. 120 tablet 5   No current facility-administered medications on file prior to visit.    Social History   Socioeconomic History  . Marital status: Married    Spouse name: Molly Maduro  . Number of children: 2  . Years of education: 34  . Highest education level: High school graduate  Occupational History  . Occupation: disabled    Associate Professor: UNEMPLOYED    Comment: 03/2010 for peripheral neuropathy  . 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  Social Needs  . Financial resource strain: Not hard at all  . Food insecurity:    Worry:  Never true    Inability: Never true  . Transportation needs:    Medical: No    Non-medical: No  Tobacco Use  . Smoking status: Never Smoker  . Smokeless tobacco: Never Used  Substance and Sexual Activity  . Alcohol use: No    Alcohol/week: 0.0 oz  . Drug use: No  . Sexual activity: Never    Birth control/protection: Post-menopausal, Surgical  Lifestyle  . Physical activity:    Days per week: 0 days    Minutes per session: 0 min  . Stress: Rather much  Relationships  . Social connections:    Talks on phone: More than three times a week    Gets together: More than three times a week    Attends religious service: Never    Active member of club or organization: No    Attends meetings of clubs or organizations: Never    Relationship status: Married  . Intimate partner violence:    Fear of current or ex partner: No    Emotionally abused: No    Physically abused: No    Forced sexual activity: No  Other Topics Concern  . Not on file  Social History Narrative   Marital status:Married x 45 yrs. Happily married, no abuse.       Children:  2 children (daughter 8, son 81). Two grandsons and 2 step grandchildren.  1 gg.      Lives: with husband, daughter, son-in-law, 2 grandsons, and 1 on the way.      Employment: disability for peripheral neuropathy 2012.  Previously had home daycare.      Tobacco: none      Alcohol: none      Drugs: none      Exercise: none. [employer].      Pets: dog.      Always uses seat belts, smoke detectors in home.      No guns in the home.       Caffeine use: 2 cups coffee per day.       Nutrition: Well balanced diet.   Family History  Problem Relation Age of Onset  . Diabetes Mother   . Hypertension Mother   . Arthritis Mother        knees, lumbar DDD, cervical DDD  . Cancer Father        prostate,skin,lymphoma.  . Cancer Brother 13       bladder cancer; non-smoker  . Diabetes Maternal Grandmother   . Heart disease Maternal  Grandmother   . COPD Maternal Grandmother   . Diabetes Paternal Grandmother   . Obesity Brother   . Diabetes Son   . Hypertension Son   .  Cancer Maternal Grandfather   . Diabetes Paternal Grandfather        Objective:    BP 126/78 (BP Location: Left Arm, Patient Position: Sitting, Cuff Size: Large)   Pulse 97   Temp 98.6 F (37 C) (Oral)   Ht 5' 8.31" (1.735 m)   Wt 283 lb (128.4 kg)   SpO2 97%   BMI 42.64 kg/m  Physical Exam  Constitutional: She is oriented to person, place, and time. She appears well-developed and well-nourished. No distress.  HENT:  Head: Normocephalic and atraumatic.  Right Ear: External ear normal.  Left Ear: External ear normal.  Nose: Nose normal.  Mouth/Throat: Oropharynx is clear and moist.  Eyes: Pupils are equal, round, and reactive to light. Conjunctivae and EOM are normal.  Neck: Normal range of motion. Neck supple. Carotid bruit is not present. No thyromegaly present.  Cardiovascular: Normal rate, regular rhythm, normal heart sounds and intact distal pulses. Exam reveals no gallop and no friction rub.  No murmur heard. Pulmonary/Chest: Effort normal and breath sounds normal. She has no wheezes. She has no rales.  Abdominal: Soft. Bowel sounds are normal. She exhibits no distension and no mass. There is no tenderness. There is no rebound and no guarding.  Lymphadenopathy:    She has no cervical adenopathy.  Neurological: She is alert and oriented to person, place, and time. No cranial nerve deficit. She exhibits normal muscle tone. Coordination normal.  Skin: Skin is warm and dry. No rash noted. She is not diaphoretic. No erythema. No pallor.  Psychiatric: She has a normal mood and affect. Her behavior is normal. Judgment and thought content normal.   No results found.        Assessment & Plan:   1. Diabetic peripheral neuropathy associated with type 2 diabetes mellitus (HCC)   2. Essential hypertension, benign   3. Pure  hypercholesterolemia   4. Chronic systolic heart failure (HCC)   5. History of CVA (cerebrovascular accident)   6. Chronic renal impairment, stage 3 (moderate) (HCC)   7. Morbid obesity (HCC)   8. Atherosclerosis of native coronary artery of native heart without angina pectoris    Uncontrolled diabetes mellitus with peripheral neuropathy and renal insufficiency: Obtain labs for chronic disease management.  Due to hypoglycemia, recommend splitting Amaryl to 4 mg twice daily.  Chronic renal insufficiency: Stable.  Recent follow-up with nephrology.  No adjustments to management.  Encourage aggressive control of diabetes mellitus to prevent further progression of renal insufficiency.  Coronary artery disease with history of systolic heart failure: Stable at this time.  Followed closely by cardiology.  Morbid obesity:Recommend weight loss, exercise for 30-60 minutes five days per week; recommend 1200 kcal restriction per day with a minimum of 60 grams of protein per day.  Eat 3 meals per day. Do not skip meals. Consider having a protein shake as a meal replacement to aid with eliminating meal skipping. Look for products with <220 calories, <7 gm sugar, and 20-30 gm protein.  Eat breakfast within 2 hours of getting up.   Make  your plate non-starchy vegetables,  protein, and  carbohydrates at lunch and dinner.   Aim for at least 64 oz. of calorie-free beverages daily (water, Crystal Light, diet green tea, etc.). Eliminate any sugary beverages such as regular soda, sweet tea, or fruit juice.   Pay attention to hunger and fullness cues.  Stop eating once you feel satisfied; don't wait until you feel full, stuffed, or sick from eating.  Choose  lean meats and low fat/fat free dairy products.  Choose foods high in fiber such as fruits, vegetables, and whole grains (brown rice, whole wheat pasta, whole wheat bread, etc.).  Limit foods with added sugar to <7 gm per serving.  Always eat in the  kitchen/dining room.  Never eat in the bedroom or in front of the TV.     Orders Placed This Encounter  Procedures  . Lipid panel    Order Specific Question:   Has the patient fasted?    Answer:   No  . CMP14+EGFR    Order Specific Question:   Has the patient fasted?    Answer:   No  . TSH  . Microalbumin, urine  . Microalbumin, urine  . POCT urinalysis dipstick  . POCT glucose (manual entry)  . POCT glycosylated hemoglobin (Hb A1C)   Meds ordered this encounter  Medications  . Insulin Glargine (LANTUS) 100 UNIT/ML Solostar Pen    Sig: Inject 22 Units into the skin at bedtime.    Dispense:  15 mL    Refill:  11  . glimepiride (AMARYL) 4 MG tablet    Sig: Take 1 tablet (4 mg total) by mouth 2 (two) times daily with a meal.    Dispense:  180 tablet    Refill:  3  . torsemide (DEMADEX) 20 MG tablet    Sig: Take 1 tablet (20 mg total) by mouth daily.    Dispense:  30 tablet    Refill:  1    Pt needs to contact office to schedule future appointment with Dr. Kirke Corin. [phone]    Return in about 3 months (around 04/13/2018) for follow-up chronic medical conditions.   Plato Alspaugh Paulita Fujita, M.D. Primary Care at [facility] previously [facility] [address] [place], [place]  [address] [phone] phone [phone] fax

## 2018-01-13 LAB — CMP14+EGFR
A/G RATIO: 1.6 (ref 1.2–2.2)
ALBUMIN: 4.2 g/dL (ref 3.6–4.8)
ALT: 14 IU/L (ref 0–32)
AST: 18 IU/L (ref 0–40)
Alkaline Phosphatase: 82 IU/L (ref 39–117)
BUN/Creatinine Ratio: 25 (ref 12–28)
BUN: 43 mg/dL — ABNORMAL HIGH (ref 8–27)
Bilirubin Total: 0.3 mg/dL (ref 0.0–1.2)
CALCIUM: 8.9 mg/dL (ref 8.7–10.3)
CO2: 26 mmol/L (ref 20–29)
Chloride: 98 mmol/L (ref 96–106)
Creatinine, Ser: 1.71 mg/dL — ABNORMAL HIGH (ref 0.57–1.00)
GFR, EST AFRICAN AMERICAN: 36 mL/min/{1.73_m2} — AB (ref >59)
GFR, EST NON AFRICAN AMERICAN: 31 mL/min/{1.73_m2} — AB (ref >59)
GLOBULIN, TOTAL: 2.6 g/dL (ref 1.5–4.5)
Glucose: 201 mg/dL — ABNORMAL HIGH (ref 65–99)
POTASSIUM: 4.9 mmol/L (ref 3.5–5.2)
SODIUM: 139 mmol/L (ref 134–144)
TOTAL PROTEIN: 6.8 g/dL (ref 6.0–8.5)

## 2018-01-13 LAB — TSH: TSH: 1.46 u[IU]/mL (ref 0.450–4.500)

## 2018-01-13 LAB — LIPID PANEL
Chol/HDL Ratio: 2.9 ratio (ref 0.0–4.4)
Cholesterol, Total: 94 mg/dL — ABNORMAL LOW (ref 100–199)
HDL: 32 mg/dL — ABNORMAL LOW (ref >39)
LDL CALC: 29 mg/dL (ref 0–99)
TRIGLYCERIDES: 165 mg/dL — AB (ref 0–149)
VLDL Cholesterol Cal: 33 mg/dL (ref 5–40)

## 2018-01-13 LAB — MICROALBUMIN, URINE: Microalbumin, Urine: 41 ug/mL

## 2018-01-14 ENCOUNTER — Encounter: Payer: Self-pay | Admitting: Physician Assistant

## 2018-01-20 ENCOUNTER — Ambulatory Visit (INDEPENDENT_AMBULATORY_CARE_PROVIDER_SITE_OTHER): Payer: PPO | Admitting: *Deleted

## 2018-01-20 DIAGNOSIS — I255 Ischemic cardiomyopathy: Secondary | ICD-10-CM | POA: Diagnosis not present

## 2018-01-20 NOTE — Progress Notes (Signed)
Remote ICD transmission.   

## 2018-01-21 LAB — CUP PACEART REMOTE DEVICE CHECK
Battery Remaining Longevity: 46 mo
Battery Voltage: 2.97 V
Brady Statistic AP VS Percent: 0.02 %
Brady Statistic AS VS Percent: 0.05 %
Brady Statistic RA Percent Paced: 44.14 %
Brady Statistic RV Percent Paced: 99.55 %
HighPow Impedance: 76 Ohm
Implantable Lead Implant Date: 20150930
Implantable Lead Implant Date: 20150930
Implantable Lead Location: 753859
Implantable Lead Location: 753860
Implantable Lead Model: 4398
Implantable Lead Model: 5076
Lead Channel Impedance Value: 323 Ohm
Lead Channel Impedance Value: 380 Ohm
Lead Channel Impedance Value: 399 Ohm
Lead Channel Impedance Value: 456 Ohm
Lead Channel Impedance Value: 532 Ohm
Lead Channel Impedance Value: 532 Ohm
Lead Channel Impedance Value: 570 Ohm
Lead Channel Impedance Value: 570 Ohm
Lead Channel Impedance Value: 570 Ohm
Lead Channel Pacing Threshold Amplitude: 0.5 V
Lead Channel Pacing Threshold Amplitude: 0.75 V
Lead Channel Pacing Threshold Pulse Width: 0.4 ms
Lead Channel Pacing Threshold Pulse Width: 0.4 ms
Lead Channel Sensing Intrinsic Amplitude: 1.875 mV
Lead Channel Setting Pacing Amplitude: 1.5 V
Lead Channel Setting Pacing Amplitude: 2 V
Lead Channel Setting Pacing Amplitude: 2 V
Lead Channel Setting Pacing Pulse Width: 0.4 ms
Lead Channel Setting Sensing Sensitivity: 0.3 mV
MDC IDC LEAD IMPLANT DT: 20150930
MDC IDC LEAD LOCATION: 753857
MDC IDC MSMT LEADCHNL LV IMPEDANCE VALUE: 323 Ohm
MDC IDC MSMT LEADCHNL LV IMPEDANCE VALUE: 342 Ohm
MDC IDC MSMT LEADCHNL LV IMPEDANCE VALUE: 342 Ohm
MDC IDC MSMT LEADCHNL RA IMPEDANCE VALUE: 380 Ohm
MDC IDC MSMT LEADCHNL RA SENSING INTR AMPL: 1.875 mV
MDC IDC MSMT LEADCHNL RV PACING THRESHOLD AMPLITUDE: 0.625 V
MDC IDC MSMT LEADCHNL RV PACING THRESHOLD PULSEWIDTH: 0.4 ms
MDC IDC MSMT LEADCHNL RV SENSING INTR AMPL: 29 mV
MDC IDC MSMT LEADCHNL RV SENSING INTR AMPL: 29 mV
MDC IDC PG IMPLANT DT: 20150930
MDC IDC SESS DTM: 20190416084224
MDC IDC SET LEADCHNL LV PACING PULSEWIDTH: 0.4 ms
MDC IDC STAT BRADY AP VP PERCENT: 44.3 %
MDC IDC STAT BRADY AS VP PERCENT: 55.63 %

## 2018-01-22 ENCOUNTER — Encounter: Payer: Self-pay | Admitting: Cardiology

## 2018-02-24 ENCOUNTER — Ambulatory Visit: Payer: PPO | Admitting: Physician Assistant

## 2018-02-24 ENCOUNTER — Other Ambulatory Visit: Payer: Self-pay

## 2018-02-24 ENCOUNTER — Emergency Department (HOSPITAL_COMMUNITY)
Admission: EM | Admit: 2018-02-24 | Discharge: 2018-02-25 | Disposition: A | Discharge status: Home / Self Care | Payer: PPO | Attending: Emergency Medicine | Admitting: Emergency Medicine

## 2018-02-24 ENCOUNTER — Encounter (HOSPITAL_COMMUNITY): Payer: Self-pay

## 2018-02-24 ENCOUNTER — Encounter: Payer: Self-pay | Admitting: Physician Assistant

## 2018-02-24 ENCOUNTER — Emergency Department (HOSPITAL_COMMUNITY): Payer: PPO

## 2018-02-24 ENCOUNTER — Ambulatory Visit (INDEPENDENT_AMBULATORY_CARE_PROVIDER_SITE_OTHER): Payer: PPO

## 2018-02-24 VITALS — BP 68/40 | HR 80 | Temp 98.6°F | Resp 16 | Ht 68.31 in | Wt 284.8 lb

## 2018-02-24 DIAGNOSIS — R531 Weakness: Secondary | ICD-10-CM | POA: Diagnosis not present

## 2018-02-24 DIAGNOSIS — I110 Hypertensive heart disease with heart failure: Secondary | ICD-10-CM | POA: Diagnosis not present

## 2018-02-24 DIAGNOSIS — Z794 Long term (current) use of insulin: Secondary | ICD-10-CM | POA: Diagnosis not present

## 2018-02-24 DIAGNOSIS — R404 Transient alteration of awareness: Secondary | ICD-10-CM | POA: Diagnosis not present

## 2018-02-24 DIAGNOSIS — J40 Bronchitis, not specified as acute or chronic: Secondary | ICD-10-CM | POA: Diagnosis not present

## 2018-02-24 DIAGNOSIS — E119 Type 2 diabetes mellitus without complications: Secondary | ICD-10-CM | POA: Diagnosis not present

## 2018-02-24 DIAGNOSIS — F329 Major depressive disorder, single episode, unspecified: Secondary | ICD-10-CM | POA: Diagnosis not present

## 2018-02-24 DIAGNOSIS — Z95810 Presence of automatic (implantable) cardiac defibrillator: Secondary | ICD-10-CM | POA: Diagnosis not present

## 2018-02-24 DIAGNOSIS — I959 Hypotension, unspecified: Secondary | ICD-10-CM

## 2018-02-24 DIAGNOSIS — R059 Cough, unspecified: Secondary | ICD-10-CM

## 2018-02-24 DIAGNOSIS — F17210 Nicotine dependence, cigarettes, uncomplicated: Secondary | ICD-10-CM | POA: Diagnosis not present

## 2018-02-24 DIAGNOSIS — R0602 Shortness of breath: Secondary | ICD-10-CM

## 2018-02-24 DIAGNOSIS — J209 Acute bronchitis, unspecified: Secondary | ICD-10-CM | POA: Diagnosis not present

## 2018-02-24 DIAGNOSIS — Z9049 Acquired absence of other specified parts of digestive tract: Secondary | ICD-10-CM | POA: Diagnosis not present

## 2018-02-24 DIAGNOSIS — I2510 Atherosclerotic heart disease of native coronary artery without angina pectoris: Secondary | ICD-10-CM | POA: Insufficient documentation

## 2018-02-24 DIAGNOSIS — I5022 Chronic systolic (congestive) heart failure: Secondary | ICD-10-CM | POA: Insufficient documentation

## 2018-02-24 DIAGNOSIS — Z79899 Other long term (current) drug therapy: Secondary | ICD-10-CM | POA: Diagnosis not present

## 2018-02-24 DIAGNOSIS — Z7902 Long term (current) use of antithrombotics/antiplatelets: Secondary | ICD-10-CM | POA: Diagnosis not present

## 2018-02-24 DIAGNOSIS — R05 Cough: Secondary | ICD-10-CM

## 2018-02-24 DIAGNOSIS — R42 Dizziness and giddiness: Secondary | ICD-10-CM | POA: Diagnosis not present

## 2018-02-24 DIAGNOSIS — E860 Dehydration: Secondary | ICD-10-CM

## 2018-02-24 DIAGNOSIS — N179 Acute kidney failure, unspecified: Secondary | ICD-10-CM

## 2018-02-24 DIAGNOSIS — Z955 Presence of coronary angioplasty implant and graft: Secondary | ICD-10-CM | POA: Insufficient documentation

## 2018-02-24 LAB — POCT CBC
GRANULOCYTE PERCENT: 71.1 % (ref 37–80)
HEMATOCRIT: 36.6 % — AB (ref 37.7–47.9)
HEMOGLOBIN: 12 g/dL — AB (ref 12.2–16.2)
Lymph, poc: 2.4 (ref 0.6–3.4)
MCH, POC: 29.3 pg (ref 27–31.2)
MCHC: 32.8 g/dL (ref 31.8–35.4)
MCV: 89.3 fL (ref 80–97)
MID (CBC): 0.5 (ref 0–0.9)
MPV: 7.6 fL (ref 0–99.8)
POC GRANULOCYTE: 7.1 — AB (ref 2–6.9)
POC LYMPH PERCENT: 23.7 %L (ref 10–50)
POC MID %: 5.2 % (ref 0–12)
Platelet Count, POC: 191 10*3/uL (ref 142–424)
RBC: 4.1 M/uL (ref 4.04–5.48)
RDW, POC: 13.2 %
WBC: 10 10*3/uL (ref 4.6–10.2)

## 2018-02-24 LAB — CBC WITH DIFFERENTIAL/PLATELET
Abs Immature Granulocytes: 0 10*3/uL (ref 0.0–0.1)
BASOS ABS: 0 10*3/uL (ref 0.0–0.1)
BASOS PCT: 0 %
EOS ABS: 0 10*3/uL (ref 0.0–0.7)
EOS PCT: 0 %
HCT: 33.6 % — ABNORMAL LOW (ref 36.0–46.0)
Hemoglobin: 10.9 g/dL — ABNORMAL LOW (ref 12.0–15.0)
Immature Granulocytes: 0 %
Lymphocytes Relative: 25 %
Lymphs Abs: 2.3 10*3/uL (ref 0.7–4.0)
MCH: 29.5 pg (ref 26.0–34.0)
MCHC: 32.4 g/dL (ref 30.0–36.0)
MCV: 90.8 fL (ref 78.0–100.0)
Monocytes Absolute: 0.6 10*3/uL (ref 0.1–1.0)
Monocytes Relative: 6 %
NEUTROS PCT: 69 %
Neutro Abs: 6.1 10*3/uL (ref 1.7–7.7)
PLATELETS: 147 10*3/uL — AB (ref 150–400)
RBC: 3.7 MIL/uL — AB (ref 3.87–5.11)
RDW: 13.1 % (ref 11.5–15.5)
WBC: 9.1 10*3/uL (ref 4.0–10.5)

## 2018-02-24 LAB — COMPREHENSIVE METABOLIC PANEL
ALBUMIN: 3.4 g/dL — AB (ref 3.5–5.0)
ALT: 16 U/L (ref 14–54)
AST: 18 U/L (ref 15–41)
Alkaline Phosphatase: 63 U/L (ref 38–126)
Anion gap: 12 (ref 5–15)
BUN: 42 mg/dL — AB (ref 6–20)
CHLORIDE: 97 mmol/L — AB (ref 101–111)
CO2: 28 mmol/L (ref 22–32)
Calcium: 8.1 mg/dL — ABNORMAL LOW (ref 8.9–10.3)
Creatinine, Ser: 2.36 mg/dL — ABNORMAL HIGH (ref 0.44–1.00)
GFR calc Af Amer: 24 mL/min — ABNORMAL LOW (ref >60)
GFR calc non Af Amer: 21 mL/min — ABNORMAL LOW (ref >60)
Glucose, Bld: 134 mg/dL — ABNORMAL HIGH (ref 65–99)
Potassium: 3.4 mmol/L — ABNORMAL LOW (ref 3.5–5.1)
SODIUM: 137 mmol/L (ref 135–145)
Total Bilirubin: 0.6 mg/dL (ref 0.3–1.2)
Total Protein: 6.5 g/dL (ref 6.5–8.1)

## 2018-02-24 LAB — I-STAT TROPONIN, ED: TROPONIN I, POC: 0 ng/mL (ref 0.00–0.08)

## 2018-02-24 LAB — I-STAT CG4 LACTIC ACID, ED: Lactic Acid, Venous: 1.35 mmol/L (ref 0.5–1.9)

## 2018-02-24 LAB — BRAIN NATRIURETIC PEPTIDE: B Natriuretic Peptide: 170.1 pg/mL — ABNORMAL HIGH (ref 0.0–100.0)

## 2018-02-24 MED ORDER — METHYLPREDNISOLONE SODIUM SUCC 125 MG IJ SOLR
125.0000 mg | Freq: Once | INTRAMUSCULAR | Status: AC
  Administered 2018-02-24: 125 mg via INTRAVENOUS
  Filled 2018-02-24: qty 2

## 2018-02-24 MED ORDER — SODIUM CHLORIDE 0.9 % IV BOLUS
1000.0000 mL | Freq: Once | INTRAVENOUS | Status: AC
  Administered 2018-02-24: 1000 mL via INTRAVENOUS

## 2018-02-24 MED ORDER — IPRATROPIUM-ALBUTEROL 0.5-2.5 (3) MG/3ML IN SOLN
3.0000 mL | Freq: Once | RESPIRATORY_TRACT | Status: AC
  Administered 2018-02-24: 3 mL via RESPIRATORY_TRACT
  Filled 2018-02-24: qty 3

## 2018-02-24 MED ORDER — IPRATROPIUM BROMIDE 0.02 % IN SOLN
0.5000 mg | Freq: Once | RESPIRATORY_TRACT | Status: AC
  Administered 2018-02-24: 0.5 mg via RESPIRATORY_TRACT

## 2018-02-24 MED ORDER — SODIUM CHLORIDE 0.9 % IV BOLUS: 1000.0000 mL | Freq: Once | INTRAVENOUS | Status: DC

## 2018-02-24 MED ORDER — AZITHROMYCIN 500 MG PO TABS: 500.0000 mg | ORAL_TABLET | Freq: Every day | ORAL | 0 refills | Status: DC

## 2018-02-24 MED ORDER — ALBUTEROL SULFATE (2.5 MG/3ML) 0.083% IN NEBU
2.5000 mg | INHALATION_SOLUTION | Freq: Once | RESPIRATORY_TRACT | Status: AC
  Administered 2018-02-24: 2.5 mg via RESPIRATORY_TRACT

## 2018-02-24 MED ORDER — DOXYCYCLINE HYCLATE 100 MG PO TABS
100.0000 mg | ORAL_TABLET | Freq: Once | ORAL | Status: AC
  Administered 2018-02-24: 100 mg via ORAL
  Filled 2018-02-24: qty 1

## 2018-02-24 MED ORDER — SODIUM CHLORIDE 0.9 % IV BOLUS
500.0000 mL | Freq: Once | INTRAVENOUS | Status: AC
  Administered 2018-02-24: 500 mL via INTRAVENOUS

## 2018-02-24 MED ORDER — BENZONATATE 100 MG PO CAPS: 100.0000 mg | ORAL_CAPSULE | Freq: Three times a day (TID) | ORAL | 0 refills | Status: DC | PRN

## 2018-02-24 NOTE — Progress Notes (Signed)
Patient ID: [name], female    DOB: [birth year], 62 y.o.   MRN: [id number]  PCP: Wardell Honour, MD  Chief Complaint  Patient presents with  . URI    cough x 1 week, body aches, no appetite, chest tightness, wheezing     Subjective:   Presents for evaluation of respiratory symptoms. She has multiple medical problems, including diabetes, chronic CHF, chronic renal insufficiency, history of CVA.Marland Kitchen  She is accompanied by her mother.  Reports 1 week of cough, non-productive, SOB, achiness and fatigue. No runny nose, nasal congestion or sore throat. Mild ear pain. Feels tight in the chest. Loss of appetite. Temperature 100.3 this morning. No chills. No nausea, vomiting, diarrhea. Chest wall tenderness with coughing. Feels dizzy.    Review of Systems As above.    Patient Active Problem List   Diagnosis Date Noted  . Class 3 obesity due to excess calories with serious comorbidity and body mass index (BMI) of 40.0 to 44.9 in adult 09/02/2016  . History of CVA (cerebrovascular accident) 11/11/2015  . Chronic renal insufficiency 08/04/2015  . OSA (obstructive sleep apnea) 08/15/2014  . Chronic left ventricular systolic dysfunction 95/62/1308  . Morbid obesity ([place]) 10/26/2013  . Essential hypertension, benign 04/29/2013  . Pure hypercholesterolemia 12/07/2012  . Iron deficiency anemia 12/07/2012  . Diabetic peripheral neuropathy associated with type 2 diabetes mellitus ([place]) 12/07/2012  . Chronic systolic heart failure ([place]) 07/29/2012  . Left bundle branch block 07/25/2012  . CAD in native artery 07/25/2012     Prior to Admission medications   Medication Sig Start Date End Date Taking? Authorizing Provider  AMBULATORY NON FORMULARY MEDICATION 1 Units by Other route once. Medication Name: foot brace 11/15/15  Yes Patel, Donika K, DO  Calcium Carb-Cholecalciferol (CALCIUM 600 + D PO) Take 1 tablet by mouth 2 (two) times daily.   Yes [provider]    carvedilol (COREG) 3.125 MG tablet TAKE 1 TABLET BY MOUTH TWICE (2) DAILY 01/02/18  Yes Deboraha Sprang, MD  citalopram (CELEXA) 20 MG tablet Take 1 tablet (20 mg total) by mouth daily. 10/20/17  Yes Wardell Honour, MD  clopidogrel (PLAVIX) 75 MG tablet TAKE 1 TABLET BY MOUTH ONCE DAILY 07/24/17  Yes Wardell Honour, MD  gabapentin (NEURONTIN) 600 MG tablet Take 1 tablet (600 mg total) by mouth 3 (three) times daily. 10/20/17  Yes Wardell Honour, MD  glimepiride (AMARYL) 4 MG tablet Take 1 tablet (4 mg total) by mouth 2 (two) times daily with a meal. 01/12/18  Yes Wardell Honour, MD  glucose blood (ONE TOUCH ULTRA TEST) test strip Check sugar three times daily dx: DMII uncontrolled insulin dependent with complications 6/57/84  Yes Wardell Honour, MD  Insulin Glargine (LANTUS) 100 UNIT/ML Solostar Pen Inject 22 Units into the skin at bedtime. 01/12/18  Yes Wardell Honour, MD  Insulin Pen Needle (B-D ULTRAFINE III SHORT PEN) 31G X 8 MM MISC USE AS DIRECTED WITH LANTUS; dx: DMII uncontrolled, insulin dependent, with complications 6/96/29  Yes Wardell Honour, MD  isosorbide mononitrate (IMDUR) 30 MG 24 hr tablet TAKE 1 TABLET BY MOUTH ONCE DAILY 07/24/17  Yes Wardell Honour, MD  MULTIPLE VITAMIN PO Take 1 tablet by mouth every morning.    Yes [provider]  nitroGLYCERIN (NITROSTAT) 0.4 MG SL tablet Place under the tongue. Reported on 04/16/2016 07/26/12  Yes [provider]  Omega-3 Fatty Acids (FISH OIL) 1000 MG CAPS Take  1,000 mg by mouth 2 (two) times daily.    Yes [provider]  OVER THE COUNTER MEDICATION    Yes [provider]  rosuvastatin (CRESTOR) 40 MG tablet TAKE 1 TABLET BY MOUTH ONCE DAILY 12/16/17  Yes Gollan, Kathlene November, MD  torsemide (DEMADEX) 20 MG tablet Take 1 tablet (20 mg total) by mouth daily. 01/12/18  Yes Wardell Honour, MD  traMADol (ULTRAM) 50 MG tablet TAKE 1 TABLET BY MOUTH EVERY 6 HOURS AS NEEDED FOR PAIN. 01/02/18  Yes Wardell Honour, MD      No Known Allergies     Objective:  Physical Exam  Constitutional: She is oriented to person, place, and time. She appears well-developed and well-nourished. She is active and cooperative. No distress.  BP (!) 98/50 (BP Location: Left Arm)   Pulse 81   Temp 98.6 F (37 C)   Resp 16   Ht 5' 8.31" (1.735 m)   Wt 284 lb 12.8 oz (129.2 kg)   SpO2 97%   BMI 42.91 kg/m   HENT:  Head: Normocephalic and atraumatic.  Right Ear: Hearing normal.  Left Ear: Hearing normal.  Eyes: Conjunctivae are normal. No scleral icterus.  Neck: Normal range of motion. Neck supple. No thyromegaly present.  Cardiovascular: Normal rate, regular rhythm and normal heart sounds.  Pulses:      Radial pulses are 2+ on the right side, and 2+ on the left side.  Pulmonary/Chest: Effort normal. No stridor. No respiratory distress. She has no wheezes. She has rhonchi in the right lower field. She has no rales. She exhibits no tenderness.  Lymphadenopathy:       Head (right side): No tonsillar, no preauricular, no posterior auricular and no occipital adenopathy present.       Head (left side): No tonsillar, no preauricular, no posterior auricular and no occipital adenopathy present.    She has no cervical adenopathy.       Right: No supraclavicular adenopathy present.       Left: No supraclavicular adenopathy present.  Neurological: She is alert and oriented to person, place, and time. No sensory deficit.  Skin: Skin is warm, dry and intact. No rash noted. No cyanosis or erythema. Nails show no clubbing.  Psychiatric: She has a normal mood and affect. Her speech is normal and behavior is normal.    Following albuterol + Atrovent neb treatment, she felt mildly better. No change in exam findings. During the treatment, she became diaphoretic.   Results for orders placed or performed in visit on 02/24/18  POCT CBC  Result Value Ref Range   WBC 10.0 4.6 - 10.2 K/uL   Lymph, poc 2.4 0.6 - 3.4   POC LYMPH  PERCENT 23.7 10 - 50 %L   MID (cbc) 0.5 0 - 0.9   POC MID % 5.2 0 - 12 %M   POC Granulocyte 7.1 (A) 2 - 6.9   Granulocyte percent 71.1 37 - 80 %G   RBC 4.10 4.04 - 5.48 M/uL   Hemoglobin 12.0 (A) 12.2 - 16.2 g/dL   HCT, POC 36.6 (A) 37.7 - 47.9 %   MCV 89.3 80 - 97 fL   MCH, POC 29.3 27 - 31.2 pg   MCHC 32.8 31.8 - 35.4 g/dL   RDW, POC 13.2 %   Platelet Count, POC 191 142 - 424 K/uL   MPV 7.6 0 - 99.8 fL    Dg Chest 2 View  Result Date: 02/24/2018 CLINICAL DATA:  62  y/o  F; cough with right lower lung rhonchi. EXAM: CHEST - 2 VIEW COMPARISON:  10/25/2015 chest radiograph FINDINGS: Stable normal cardiac silhouette given projection and technique. Three lead AICD. Aortic atherosclerosis with calcification. Bronchitic changes in the right lung base. No focal consolidation. No pleural effusion or pneumothorax. No acute osseous abnormality is evident. IMPRESSION: Bronchitic changes in the right lung base.  No focal consolidation. Electronically Signed   By: Kristine Garbe M.D.   On: 02/24/2018 18:30   Initial BP 88/60. Repeat 98/50. Following neb, dropped to 78/48. Repeat 68/40. No associated increase in dizziness, SOB. No CP. IV started.        Assessment & Plan:   1. Cough 2. SOB (shortness of breath) - DG Chest 2 View - POCT CBC - albuterol (PROVENTIL) (2.5 MG/3ML) 0.083% nebulizer solution 2.5 mg - ipratropium (ATROVENT) nebulizer solution 0.5 mg  3. Acute bronchitis, unspecified organism Planned to cover for atypicals with azithromycin, but SBP continued to drop. Suspect CAP, not visible due to reduce volume.  4. Hypotension, unspecified hypotension type Concern for evolving sepsis, worse than suspected based on her general appearance today. No anticipated compensatory tachycardia. IV started. Transport to ED for additional evaluation and treatment.   Discussed with Dr. Janeann Forehand.   No follow-ups on file.   Fara Chute, PA-C Primary Care at  [place]

## 2018-02-24 NOTE — Patient Instructions (Signed)
     IF you received an x-ray today, you will receive an invoice from [facility]. Please contact  Radiology at [phone] with questions or concerns regarding your invoice.   IF you received labwork today, you will receive an invoice from [facility]. Please contact [facility] at [phone] with questions or concerns regarding your invoice.   Our billing staff will not be able to assist you with questions regarding bills from these companies.  You will be contacted with the lab results as soon as they are available. The fastest way to get your results is to activate your My Chart account. Instructions are located on the last page of this paperwork. If you have not heard from us regarding the results in 2 weeks, please contact this office.     

## 2018-02-24 NOTE — ED Notes (Signed)
Ambulated pt while observing Oxygen, stayed between 95 and 97. Pt stated she felt fine, no dizziness or light headed feeling

## 2018-02-24 NOTE — ED Provider Notes (Signed)
[facility] EMERGENCY DEPARTMENT Provider Note   CSN: [id number] Arrival date & time: 02/24/18  1952     History   Chief Complaint Chief Complaint  Patient presents with  . Weakness  . Hypotension    HPI [name] is a 62 y.o. female.  HPI [name] is a 62 y.o. female with history of MI, CHF, anemia, AICD, presents to emergency department complaint of cough.  Patient states she has had upper respiratory symptoms for a week, which include nasal congestion that is clear, cough that is dry, generalized malaise.  She states fever up to 100.2 at home.  She has been taking DayQuil with no relief of her symptoms.  She has history of pneumonia and feels the same.  She went to urgent care today, where she was found to be hypotensive.  She was given a breathing treatment and a bolus of IV fluids and transported here.  Blood pressures at urgent care as low as 60 systolic.  She states she has had not had any appetite in the last several days and has not been eating or drinking.  She denies any nausea or vomiting.  She had one episode of diarrhea.  Denies any chest pain or abdominal pain.  No peripheral edema.  Past Medical History:  Diagnosis Date  . Acute myocardial infarction, subendocardial infarction, initial episode of care ([place]) 07/21/2012  . Acute osteomyelitis involving ankle and foot ([place]) 02/06/2015  . Acute systolic heart failure ([place]) 07/21/2012   New onset 07/19/12; admission to [facility] ED. Elevated Troponins.  S/p 2D-echo with EF 20-25%.  S/p cardiac catheterization with stenting LAD.  Repeat 2D-echo 10/2011 with improved EF of 35%.   . Anemia   . Automatic implantable cardioverter-defibrillator in situ    a. MDT CRT-D 06/2014, SN: [id number]    . CAD (coronary artery disease)    a. cardiac cath 101/04/2012: PCI/DES to chronically occluded mLAD, consideration PCI to diag branch in 4 weeks.   . Cataract   . Chicken pox   . Chronic systolic CHF  (congestive heart failure) (HCC)    a. mixed ICM & NICM; b. EF 20-25% by echo 07/2012, mid-dist 2/3 of LV sev HK/AK, mild MR. echo 10/2012: EF 30-35%, sev HK ant-septal & inf walls, GR1DD, mild MR, PASP 33. c. echo 02/2013: EF 30%, GR1DD, mild MR. echo 04/2014: EF 30%, Septal-lat dyssynchrony, global HK, inf AK, GR1DD, mild MR. d. echo 10/2014: EF50-55%, WM nl, GR1DD, septal mild paradox. e. echo 02/2015: EF 50-55%, wm   . Depression   . Heart attack ([place])   . Heel ulcer ([place]) 04/27/2015  . History of blood transfusion ~ 2011   "plasma; had neuropathy; couldn't walk"  . Hypertension   . LBBB (left bundle branch block)   . Neuromuscular disorder ([place])   . Neuropathy 2011  . Obesity, unspecified   . OSA on CPAP    Moderate with AHI 23/hr and now on CPAP at 16cm H2O.  Her DME is [facility]  . Pure hypercholesterolemia   . Type II diabetes mellitus ([place])   . Unspecified vitamin D deficiency     Patient Active Problem List   Diagnosis Date Noted  . Class 3 obesity due to excess calories with serious comorbidity and body mass index (BMI) of 40.0 to 44.9 in adult 09/02/2016  . History of CVA (cerebrovascular accident) 11/11/2015  . Chronic renal insufficiency 08/04/2015  . Acute osteomyelitis of ankle and foot ([place]) 02/06/2015  .  OSA (obstructive sleep apnea) 08/15/2014  . Chronic left ventricular systolic dysfunction 16/07/9603  . Morbid obesity ([place]) 10/26/2013  . Diarrhea, unspecified 04/29/2013  . Essential hypertension, benign 04/29/2013  . Pure hypercholesterolemia 12/07/2012  . Iron deficiency anemia 12/07/2012  . Diabetic peripheral neuropathy associated with type 2 diabetes mellitus ([place]) 12/07/2012  . Type 2 diabetes mellitus without complications ([place]) 54/06/8118  . Chronic systolic congestive heart failure ([place]) 07/29/2012  . Left bundle-branch block 07/25/2012  . Atherosclerotic heart disease of native coronary artery without angina pectoris 07/25/2012    Past Surgical History:    Procedure Laterality Date  . AMPUTATION TOE Right 06/18/2015   Procedure: AMPUTATION TOE;  Surgeon: Samara Deist, DPM;  Location: [facility] ORS;  Service: Podiatry;  Laterality: Right;  . BACK SURGERY    . BI-VENTRICULAR IMPLANTABLE CARDIOVERTER DEFIBRILLATOR N/A 07/06/2014   Procedure: BI-VENTRICULAR IMPLANTABLE CARDIOVERTER DEFIBRILLATOR  (CRT-D);  Surgeon: Deboraha Sprang, MD;  Location: [facility] CATH LAB;  Service: Cardiovascular;  Laterality: N/A;  . BI-VENTRICULAR IMPLANTABLE CARDIOVERTER DEFIBRILLATOR  (CRT-D)  07/06/2014  . BILATERAL OOPHORECTOMY  01/2011   ovarian cyst benign  . COLONOSCOPY WITH PROPOFOL Left 02/22/2015   Procedure: COLONOSCOPY WITH PROPOFOL;  Surgeon: Hulen Luster, MD;  Location: [facility] ENDOSCOPY;  Service: Endoscopy;  Laterality: Left;  . CORONARY ANGIOPLASTY WITH STENT PLACEMENT Left 07/2012   new onset systolic CHF; elevated troponins.  Cardiac catheterization with stenting to LAD; EF 15%.  2D-echo: EF 20-25%.  . ESOPHAGOGASTRODUODENOSCOPY N/A 02/22/2015   Procedure: ESOPHAGOGASTRODUODENOSCOPY (EGD);  Surgeon: Hulen Luster, MD;  Location: [facility] ENDOSCOPY;  Service: Endoscopy;  Laterality: N/A;  . INCISION AND DRAINAGE ABSCESS Right 2007   groin; with ICU stay due to sepsis.  Marland Kitchen LAPAROSCOPIC CHOLECYSTECTOMY  2011  . LEFT HEART CATHETERIZATION WITH CORONARY ANGIOGRAM N/A 07/21/2012   Procedure: LEFT HEART CATHETERIZATION WITH CORONARY ANGIOGRAM;  Surgeon: Jolaine Artist, MD;  Location: [facility] CATH LAB;  Service: Cardiovascular;  Laterality: N/A;  . [place]   L4-5  . PERCUTANEOUS CORONARY STENT INTERVENTION (PCI-S) N/A 07/23/2012   Procedure: PERCUTANEOUS CORONARY STENT INTERVENTION (PCI-S);  Surgeon: Sherren Mocha, MD;  Location[facility] CATH LAB;  Service: Cardiovascular;  Laterality: N/A;  . PERIPHERAL VASCULAR CATHETERIZATION N/A 02/10/2015   Procedure: Picc Line Insertion;  Surgeon: Katha Cabal, MD;  Location: [facility] CV LAB;  Service: Cardiovascular;   Laterality: N/A;  . TUBAL LIGATION  1981  . VAGINAL HYSTERECTOMY  01/2011   Fibroids/DUB.  Ovaries removed. [place].     OB History   None      Home Medications    Prior to Admission medications   Medication Sig Start Date End Date Taking? Authorizing Provider  AMBULATORY NON FORMULARY MEDICATION 1 Units by Other route once. Medication Name: foot brace 11/15/15   Narda Amber K, DO  Calcium Carb-Cholecalciferol (CALCIUM 600 + D PO) Take 1 tablet by mouth 2 (two) times daily.    [provider]  carvedilol (COREG) 3.125 MG tablet TAKE 1 TABLET BY MOUTH TWICE (2) DAILY 01/02/18   Deboraha Sprang, MD  citalopram (CELEXA) 20 MG tablet Take 1 tablet (20 mg total) by mouth daily. 10/20/17   Wardell Honour, MD  clopidogrel (PLAVIX) 75 MG tablet TAKE 1 TABLET BY MOUTH ONCE DAILY 07/24/17   Wardell Honour, MD  gabapentin (NEURONTIN) 600 MG tablet Take 1 tablet (600 mg total) by mouth 3 (three) times daily. 10/20/17   Wardell Honour, MD  glimepiride (AMARYL) 4  MG tablet Take 1 tablet (4 mg total) by mouth 2 (two) times daily with a meal. 01/12/18   Wardell Honour, MD  glucose blood (ONE TOUCH ULTRA TEST) test strip Check sugar three times daily dx: DMII uncontrolled insulin dependent with complications 4/58/09   Wardell Honour, MD  Insulin Glargine (LANTUS) 100 UNIT/ML Solostar Pen Inject 22 Units into the skin at bedtime. 01/12/18   Wardell Honour, MD  Insulin Pen Needle (B-D ULTRAFINE III SHORT PEN) 31G X 8 MM MISC USE AS DIRECTED WITH LANTUS; dx: DMII uncontrolled, insulin dependent, with complications 9/83/38   Wardell Honour, MD  isosorbide mononitrate (IMDUR) 30 MG 24 hr tablet TAKE 1 TABLET BY MOUTH ONCE DAILY 07/24/17   Wardell Honour, MD  MULTIPLE VITAMIN PO Take 1 tablet by mouth every morning.     [provider]  nitroGLYCERIN (NITROSTAT) 0.4 MG SL tablet Place under the tongue. Reported on 04/16/2016 07/26/12   [provider]  Omega-3 Fatty Acids (FISH OIL)  1000 MG CAPS Take 1,000 mg by mouth 2 (two) times daily.     [provider]  OVER THE COUNTER MEDICATION     [provider]  rosuvastatin (CRESTOR) 40 MG tablet TAKE 1 TABLET BY MOUTH ONCE DAILY 12/16/17   Minna Merritts, MD  torsemide (DEMADEX) 20 MG tablet Take 1 tablet (20 mg total) by mouth daily. 01/12/18   Wardell Honour, MD  traMADol Veatrice Bourbon) 50 MG tablet TAKE 1 TABLET BY MOUTH EVERY 6 HOURS AS NEEDED FOR PAIN. 01/02/18   Wardell Honour, MD    Family History Family History  Problem Relation Age of Onset  . Diabetes Mother   . Hypertension Mother   . Arthritis Mother        knees, lumbar DDD, cervical DDD  . Cancer Father        prostate,skin,lymphoma.  . Cancer Brother 73       bladder cancer; non-smoker  . Diabetes Maternal Grandmother   . Heart disease Maternal Grandmother   . COPD Maternal Grandmother   . Diabetes Paternal Grandmother   . Obesity Brother   . Diabetes Son   . Hypertension Son   . Cancer Maternal Grandfather   . Diabetes Paternal Grandfather     Social History Social History   Tobacco Use  . Smoking status: Never Smoker  . Smokeless tobacco: Never Used  Substance Use Topics  . Alcohol use: No    Alcohol/week: 0.0 oz  . Drug use: No     Allergies   Patient has no known allergies.   Review of Systems Review of Systems  Constitutional: Positive for chills, fatigue and fever.  HENT: Positive for congestion. Negative for sore throat.   Respiratory: Positive for cough. Negative for chest tightness and shortness of breath.   Cardiovascular: Positive for leg swelling. Negative for chest pain and palpitations.  Gastrointestinal: Negative for abdominal pain, diarrhea, nausea and vomiting.  Genitourinary: Negative for dysuria, flank pain, pelvic pain, vaginal bleeding, vaginal discharge and vaginal pain.  Musculoskeletal: Negative for arthralgias, myalgias, neck pain and neck stiffness.  Skin: Negative for rash.  Neurological:  Positive for weakness. Negative for dizziness and headaches.  All other systems reviewed and are negative.    Physical Exam Updated Vital Signs BP (!) 112/54   Pulse 63   Temp 98.9 F (37.2 C)   Resp 16   SpO2 98%   Physical Exam  Constitutional: She is oriented to person,  place, and time. She appears well-developed and well-nourished. No distress.  HENT:  Head: Normocephalic.  Right Ear: External ear normal.  Left Ear: External ear normal.  Nose: Nose normal.  Mouth/Throat: Oropharynx is clear and moist.  Eyes: Conjunctivae are normal.  Neck: Neck supple.  Cardiovascular: Normal rate, regular rhythm and normal heart sounds.  Pulmonary/Chest: Effort normal. No respiratory distress. She has wheezes. She has rales.  Abdominal: Soft. Bowel sounds are normal. She exhibits no distension. There is no tenderness. There is no rebound.  Musculoskeletal: She exhibits no edema.  Neurological: She is alert and oriented to person, place, and time.  Skin: Skin is warm and dry.  Psychiatric: She has a normal mood and affect. Her behavior is normal.  Nursing note and vitals reviewed.    ED Treatments / Results  Labs (all labs ordered are listed, but only abnormal results are displayed) Labs Reviewed  CBC WITH DIFFERENTIAL/PLATELET - Abnormal; Notable for the following components:      Result Value   RBC 3.70 (*)    Hemoglobin 10.9 (*)    HCT 33.6 (*)    Platelets 147 (*)    All other components within normal limits  COMPREHENSIVE METABOLIC PANEL - Abnormal; Notable for the following components:   Potassium 3.4 (*)    Chloride 97 (*)    Glucose, Bld 134 (*)    BUN 42 (*)    Creatinine, Ser 2.36 (*)    Calcium 8.1 (*)    Albumin 3.4 (*)    GFR calc non Af Amer 21 (*)    GFR calc Af Amer 24 (*)    All other components within normal limits  BRAIN NATRIURETIC PEPTIDE - Abnormal; Notable for the following components:   B Natriuretic Peptide 170.1 (*)    All other components  within normal limits  URINALYSIS, ROUTINE W REFLEX MICROSCOPIC  I-STAT CG4 LACTIC ACID, ED  I-STAT TROPONIN, ED  I-STAT CG4 LACTIC ACID, ED    EKG EKG Interpretation  Date/Time:  Tuesday Feb 24 2018 19:53:05 EDT Ventricular Rate:  63 PR Interval:    QRS Duration: 139 QT Interval:  514 QTC Calculation: 527 R Axis:   -81 Text Interpretation:  Sinus rhythm RBBB and LAFB Lateral infarct, old unchanged EKG Confirmed by Noemi Chapel [phone]) on 02/24/2018 8:09:41 PM   Radiology Dg Chest 2 View  Result Date: 02/24/2018 CLINICAL DATA:  62 y/o  F; cough with right lower lung rhonchi. EXAM: CHEST - 2 VIEW COMPARISON:  10/25/2015 chest radiograph FINDINGS: Stable normal cardiac silhouette given projection and technique. Three lead AICD. Aortic atherosclerosis with calcification. Bronchitic changes in the right lung base. No focal consolidation. No pleural effusion or pneumothorax. No acute osseous abnormality is evident. IMPRESSION: Bronchitic changes in the right lung base.  No focal consolidation. Electronically Signed   By: Kristine Garbe M.D.   On: 02/24/2018 18:30    Procedures Procedures (including critical care time)  Medications Ordered in ED Medications  ipratropium-albuterol (DUONEB) 0.5-2.5 (3) MG/3ML nebulizer solution 3 mL (has no administration in time range)  sodium chloride 0.9 % bolus 1,000 mL (has no administration in time range)     Initial Impression / Assessment and Plan / ED Course  I have reviewed the triage vital signs and the nursing notes.  Pertinent labs & imaging results that were available during my care of the patient were reviewed by me and considered in my medical decision making (see chart for details).     Pt  seen and examined. Pt with extensive cardiac hx, here with URI symptoms. Initially hypotensive at UC, BP improved with IV fluids. She is orthostatic. Will give another bolus of fluids, get CXR, get labs, give another breathing  tx.  9:37 PM Chest x-ray is negative.  Patient does drop her oxygen saturation on the monitor into the high 80s, but it comes back up into the 90s each time.  Her blood pressure has been 887J systolic.  Her renal function is slightly worse than baseline, could be related to the dehydration.  That would explain her orthostatic hypotension as well.  I will give her 1 more bolus of 500 cc of saline, I will give her 1 more breathing treatment.  After that we will have patient ambulate with pulse ox and repeat orthostatics.  Pt signed out to [facility] pending duoneb, fluid bolus, orthostatics, ambulation. If all come back OK, plan to dc home with inhaler, prednisone, doxycycline.   Final Clinical Impressions(s) / ED Diagnoses   Final diagnoses:  None    ED Discharge Orders    None       Janee Morn 02/24/18 2212    Noemi Chapel, MD 02/28/18 [phone]

## 2018-02-25 MED ORDER — DOXYCYCLINE HYCLATE 100 MG PO CAPS: 100.0000 mg | ORAL_CAPSULE | Freq: Two times a day (BID) | ORAL | 0 refills | Status: DC

## 2018-02-25 MED ORDER — ALBUTEROL SULFATE HFA 108 (90 BASE) MCG/ACT IN AERS
2.0000 | INHALATION_SPRAY | RESPIRATORY_TRACT | Status: DC | PRN
  Administered 2018-02-25: 2 via RESPIRATORY_TRACT
  Filled 2018-02-25: qty 6.7

## 2018-02-25 MED ORDER — PREDNISONE 20 MG PO TABS: 40.0000 mg | ORAL_TABLET | Freq: Every day | ORAL | 0 refills | Status: DC

## 2018-02-25 NOTE — Discharge Instructions (Signed)
Your creatinine was slightly higher than normal today.  This is thought to be due to dehydration.  Please have this rechecked by your primary care doctor in 1 week.  Please use the inhaler as needed and take the antibiotics as directed.  Please return to the emergency department for any new or worsening symptoms.

## 2018-02-25 NOTE — ED Notes (Signed)
Patient Alert and oriented to baseline. Stable and ambulatory to baseline. Patient verbalized understanding of the discharge instructions.  Patient belongings were taken by the patient.   

## 2018-02-25 NOTE — ED Provider Notes (Signed)
Patient ambulates maintaining greater than 95% pulse oxygenation.  She states that she felt fine while she is walking.  She did not get dizzy or lightheaded.  She would like to be discharged. Still has some mild wheezes in left lobes. Will give inhaler for home use as well as prescribed doxycycline per plan at signout.  Return precautions discussed.  Patient understands and agrees the plan.  She is stable and ready for discharge.   Montine Circle, PA-C 02/25/18 Jen Mow    Noemi Chapel, MD 02/28/18 [phone]

## 2018-03-03 ENCOUNTER — Encounter: Payer: Self-pay | Admitting: Family Medicine

## 2018-03-10 ENCOUNTER — Other Ambulatory Visit: Payer: Self-pay | Admitting: Family Medicine

## 2018-03-10 DIAGNOSIS — Z1231 Encounter for screening mammogram for malignant neoplasm of breast: Secondary | ICD-10-CM

## 2018-03-12 DIAGNOSIS — E113211 Type 2 diabetes mellitus with mild nonproliferative diabetic retinopathy with macular edema, right eye: Secondary | ICD-10-CM | POA: Diagnosis not present

## 2018-03-12 DIAGNOSIS — Z961 Presence of intraocular lens: Secondary | ICD-10-CM | POA: Diagnosis not present

## 2018-03-12 DIAGNOSIS — H26492 Other secondary cataract, left eye: Secondary | ICD-10-CM | POA: Diagnosis not present

## 2018-03-12 DIAGNOSIS — H524 Presbyopia: Secondary | ICD-10-CM | POA: Diagnosis not present

## 2018-03-12 DIAGNOSIS — E113292 Type 2 diabetes mellitus with mild nonproliferative diabetic retinopathy without macular edema, left eye: Secondary | ICD-10-CM | POA: Diagnosis not present

## 2018-03-29 DIAGNOSIS — G610 Guillain-Barre syndrome: Secondary | ICD-10-CM | POA: Insufficient documentation

## 2018-03-29 DIAGNOSIS — G8220 Paraplegia, unspecified: Secondary | ICD-10-CM | POA: Insufficient documentation

## 2018-03-29 DIAGNOSIS — I480 Paroxysmal atrial fibrillation: Secondary | ICD-10-CM | POA: Insufficient documentation

## 2018-03-31 ENCOUNTER — Other Ambulatory Visit: Payer: Self-pay | Admitting: Internal Medicine

## 2018-04-01 ENCOUNTER — Ambulatory Visit
Admission: RE | Admit: 2018-04-01 | Discharge: 2018-04-01 | Disposition: A | Discharge status: Home / Self Care | Payer: PPO | Source: Ambulatory Visit | Attending: Family Medicine | Admitting: Family Medicine

## 2018-04-01 DIAGNOSIS — Z1231 Encounter for screening mammogram for malignant neoplasm of breast: Secondary | ICD-10-CM

## 2018-04-01 DIAGNOSIS — H26491 Other secondary cataract, right eye: Secondary | ICD-10-CM | POA: Diagnosis not present

## 2018-04-13 DIAGNOSIS — E1122 Type 2 diabetes mellitus with diabetic chronic kidney disease: Secondary | ICD-10-CM | POA: Diagnosis not present

## 2018-04-13 DIAGNOSIS — E875 Hyperkalemia: Secondary | ICD-10-CM | POA: Diagnosis not present

## 2018-04-13 DIAGNOSIS — N183 Chronic kidney disease, stage 3 (moderate): Secondary | ICD-10-CM | POA: Diagnosis not present

## 2018-04-13 DIAGNOSIS — R809 Proteinuria, unspecified: Secondary | ICD-10-CM | POA: Diagnosis not present

## 2018-04-13 DIAGNOSIS — I129 Hypertensive chronic kidney disease with stage 1 through stage 4 chronic kidney disease, or unspecified chronic kidney disease: Secondary | ICD-10-CM | POA: Diagnosis not present

## 2018-04-14 NOTE — Progress Notes (Signed)
Subjective:    Patient ID: [name], female    DOB: 07-21-1956, 62 y.o.   MRN: [id number]  04/15/2018  Chronic Conditions (3 month follow-up)    HPI This 62 y.o. female presents for evaluation of DMII, hypercholesterolemia, history of CVA, renal insufficiency, CAD/CHF, anxiety/depression.  Management changes made at last visit include the following:  Uncontrolled diabetes mellitus with peripheral neuropathy and renal insufficiency: Obtain labs for chronic disease management.  Due to hypoglycemia, recommend splitting Amaryl to 4 mg twice daily. Chronic renal insufficiency: Stable.  Recent follow-up with nephrology.  No adjustments to management.  Encourage aggressive control of diabetes mellitus to prevent further progression of renal insufficiency. Coronary artery disease with history of systolic heart failure: Stable at this time.  Followed closely by cardiology.  UPDATE: Fasting sugars 91-200. Postprandials: 125-307. Lantus 18-32 units. Taking two of Tramadol at bedtime. Admits to eating late at night which negatively impact sugars.  Family also likes to fry food and eat large meals in the evenings.  Very fearful of hypoglycemia.  No low sugars in the past 3 to 6 months. Patient reports good compliance with medication, good tolerance to medication, and good symptom control.   Has recovered from recent bronchitis.  Patient presented to see [name] and was sent to the emergency department due to hypoglycemia due to dehydration. Follow-up with [name] of neurology for peripheral neuropathy.  Last follow-up with cardiology in October 2018 for cardiomyopathy.  Underwent never nephrology follow-up yesterday.  Needs labs obtained for nephrologist as well today.  BP Readings from Last 3 Encounters:  04/15/18 118/72  02/24/18 111/80  02/24/18 (!) 68/40   Wt Readings from Last 3 Encounters:  04/15/18 292 lb (132.5 kg)  02/24/18 284 lb 12.8 oz (129.2 kg)  01/12/18 283 lb  (128.4 kg)   Immunization History  Administered Date(s) Administered  . Hepatitis B 03/16/2013  . Hepatitis B, adult 03/30/2014, 07/18/2014  . Influenza Split 07/21/2012  . Influenza,inj,Quad PF,6+ Mos 06/16/2013, 07/07/2014, 06/18/2015, 08/06/2016  . Influenza-Unspecified 07/23/2017  . Pneumococcal Polysaccharide-23 08/08/2011, 06/18/2015, 08/13/2016  . Tdap 03/07/2010, 05/03/2016  . Zoster 04/06/2014    Review of Systems  Constitutional: Negative for activity change, appetite change, chills, diaphoresis, fatigue, fever and unexpected weight change.  HENT: Negative for congestion, dental problem, drooling, ear discharge, ear pain, facial swelling, hearing loss, mouth sores, nosebleeds, postnasal drip, rhinorrhea, sinus pressure, sneezing, sore throat, tinnitus, trouble swallowing and voice change.   Eyes: Negative for photophobia, pain, discharge, redness, itching and visual disturbance.  Respiratory: Negative for apnea, cough, choking, chest tightness, shortness of breath, wheezing and stridor.   Cardiovascular: Positive for leg swelling. Negative for chest pain and palpitations.  Gastrointestinal: Negative for abdominal distention, abdominal pain, anal bleeding, blood in stool, constipation, diarrhea, nausea, rectal pain and vomiting.  Endocrine: Negative for cold intolerance, heat intolerance, polydipsia, polyphagia and polyuria.  Genitourinary: Negative for decreased urine volume, difficulty urinating, dyspareunia, dysuria, enuresis, flank pain, frequency, genital sores, hematuria, menstrual problem, pelvic pain, urgency, vaginal bleeding, vaginal discharge and vaginal pain.  Musculoskeletal: Negative for arthralgias, back pain, gait problem, joint swelling, myalgias, neck pain and neck stiffness.  Skin: Negative for color change, pallor, rash and wound.  Allergic/Immunologic: Negative for environmental allergies, food allergies and immunocompromised state.  Neurological: Positive for  weakness and numbness. Negative for dizziness, tremors, seizures, syncope, facial asymmetry, speech difficulty, light-headedness and headaches.  Hematological: Negative for adenopathy. Does not bruise/bleed easily.  Psychiatric/Behavioral: Negative for agitation, behavioral problems, confusion,  decreased concentration, dysphoric mood, hallucinations, self-injury, sleep disturbance and suicidal ideas. The patient is not nervous/anxious and is not hyperactive.     Past Medical History:  Diagnosis Date  . Acute myocardial infarction, subendocardial infarction, initial episode of care (HCC) 07/21/2012  . Acute osteomyelitis involving ankle and foot (HCC) 02/06/2015  . Acute systolic heart failure (HCC) 07/21/2012   New onset 07/19/12; admission to [facility] ED. Elevated Troponins.  S/p 2D-echo with EF 20-25%.  S/p cardiac catheterization with stenting LAD.  Repeat 2D-echo 10/2011 with improved EF of 35%.   . Anemia   . Automatic implantable cardioverter-defibrillator in situ    a. MDT CRT-D 06/2014, SN: [id number]    . CAD (coronary artery disease)    a. cardiac cath 101/04/2012: PCI/DES to chronically occluded mLAD, consideration PCI to diag branch in 4 weeks.   . Cataract   . Chicken pox   . Chronic systolic CHF (congestive heart failure) (HCC)    a. mixed ICM & NICM; b. EF 20-25% by echo 07/2012, mid-dist 2/3 of LV sev HK/AK, mild MR. echo 10/2012: EF 30-35%, sev HK ant-septal & inf walls, GR1DD, mild MR, PASP 33. c. echo 02/2013: EF 30%, GR1DD, mild MR. echo 04/2014: EF 30%, Septal-lat dyssynchrony, global HK, inf AK, GR1DD, mild MR. d. echo 10/2014: EF50-55%, WM nl, GR1DD, septal mild paradox. e. echo 02/2015: EF 50-55%, wm   . Depression   . Heart attack (HCC)   . Heel ulcer (HCC) 04/27/2015  . History of blood transfusion ~ 2011   "plasma; had neuropathy; couldn't walk"  . Hypertension   . LBBB (left bundle branch block)   . Neuromuscular disorder (HCC)   . Neuropathy 2011  . Obesity,  unspecified   . OSA on CPAP    Moderate with AHI 23/hr and now on CPAP at 16cm H2O.  Her DME is [facility]  . Pure hypercholesterolemia   . Type II diabetes mellitus (HCC)   . Unspecified vitamin D deficiency    Past Surgical History:  Procedure Laterality Date  . AMPUTATION TOE Right 06/18/2015   Procedure: AMPUTATION TOE;  Surgeon: Gwyneth Revels, DPM;  Location: [facility] ORS;  Service: Podiatry;  Laterality: Right;  . BACK SURGERY    . BI-VENTRICULAR IMPLANTABLE CARDIOVERTER DEFIBRILLATOR N/A 07/06/2014   Procedure: BI-VENTRICULAR IMPLANTABLE CARDIOVERTER DEFIBRILLATOR  (CRT-D);  Surgeon: Duke Salvia, MD;  Location: [facility] CATH LAB;  Service: Cardiovascular;  Laterality: N/A;  . BI-VENTRICULAR IMPLANTABLE CARDIOVERTER DEFIBRILLATOR  (CRT-D)  07/06/2014  . BILATERAL OOPHORECTOMY  01/2011   ovarian cyst benign  . COLONOSCOPY WITH PROPOFOL Left 02/22/2015   Procedure: COLONOSCOPY WITH PROPOFOL;  Surgeon: Wallace Cullens, MD;  Location: [facility] ENDOSCOPY;  Service: Endoscopy;  Laterality: Left;  . CORONARY ANGIOPLASTY WITH STENT PLACEMENT Left 07/2012   new onset systolic CHF; elevated troponins.  Cardiac catheterization with stenting to LAD; EF 15%.  2D-echo: EF 20-25%.  . ESOPHAGOGASTRODUODENOSCOPY N/A 02/22/2015   Procedure: ESOPHAGOGASTRODUODENOSCOPY (EGD);  Surgeon: Wallace Cullens, MD;  Location: [facility] ENDOSCOPY;  Service: Endoscopy;  Laterality: N/A;  . INCISION AND DRAINAGE ABSCESS Right 2007   groin; with ICU stay due to sepsis.  Marland Kitchen LAPAROSCOPIC CHOLECYSTECTOMY  2011  . LEFT HEART CATHETERIZATION WITH CORONARY ANGIOGRAM N/A 07/21/2012   Procedure: LEFT HEART CATHETERIZATION WITH CORONARY ANGIOGRAM;  Surgeon: Dolores Patty, MD;  Location: [facility] CATH LAB;  Service: Cardiovascular;  Laterality: N/A;  . LUMBAR DISC SURGERY  1996   L4-5  . PERCUTANEOUS CORONARY STENT INTERVENTION (PCI-S) N/A 07/23/2012  Procedure: PERCUTANEOUS CORONARY STENT INTERVENTION (PCI-S);  Surgeon: Tonny Bollman, MD;  Location: [facility] CATH  LAB;  Service: Cardiovascular;  Laterality: N/A;  . PERIPHERAL VASCULAR CATHETERIZATION N/A 02/10/2015   Procedure: Picc Line Insertion;  Surgeon: Renford Dills, MD;  Location: [facility] INVASIVE CV LAB;  Service: Cardiovascular;  Laterality: N/A;  . TUBAL LIGATION  1981  . VAGINAL HYSTERECTOMY  01/2011   Fibroids/DUB.  Ovaries removed. Fontaine.   No Known Allergies Current Outpatient Medications on File Prior to Visit  Medication Sig Dispense Refill  . AMBULATORY NON FORMULARY MEDICATION 1 Units by Other route once. Medication Name: foot brace 1 Units 0  . Calcium Carb-Cholecalciferol (CALCIUM 600 + D PO) Take 1 tablet by mouth 2 (two) times daily.    . carvedilol (COREG) 3.125 MG tablet TAKE 1 TABLET BY MOUTH TWICE A DAY 60 tablet 3  . glucose blood (ONE TOUCH ULTRA TEST) test strip Check sugar three times daily dx: DMII uncontrolled insulin dependent with complications 300 each 11  . Insulin Pen Needle (B-D ULTRAFINE III SHORT PEN) 31G X 8 MM MISC USE AS DIRECTED WITH LANTUS; dx: DMII uncontrolled, insulin dependent, with complications 100 each 11  . LOSARTAN POTASSIUM PO Take 1 tablet by mouth daily.    . Omega-3 Fatty Acids (FISH OIL) 1000 MG CAPS Take 1,000 mg by mouth 2 (two) times daily.     . rosuvastatin (CRESTOR) 40 MG tablet TAKE 1 TABLET BY MOUTH ONCE DAILY 90 tablet 3   No current facility-administered medications on file prior to visit.    Social History   Socioeconomic History  . Marital status: Married    Spouse name: Molly Maduro  . Number of children: 2  . Years of education: 19  . Highest education level: High school graduate  Occupational History  . Occupation: disabled    Associate Professor: UNEMPLOYED    Comment: 03/2010 for peripheral neuropathy  . 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  Social Needs  . Financial resource strain: Not hard at all  . Food insecurity:    Worry: Never true    Inability: Never true  . Transportation needs:    Medical: No     Non-medical: No  Tobacco Use  . Smoking status: Never Smoker  . Smokeless tobacco: Never Used  Substance and Sexual Activity  . Alcohol use: No    Alcohol/week: 0.0 oz  . Drug use: No  . Sexual activity: Never    Birth control/protection: Post-menopausal, Surgical  Lifestyle  . Physical activity:    Days per week: 0 days    Minutes per session: 0 min  . Stress: Rather much  Relationships  . Social connections:    Talks on phone: More than three times a week    Gets together: More than three times a week    Attends religious service: Never    Active member of club or organization: No    Attends meetings of clubs or organizations: Never    Relationship status: Married  . Intimate partner violence:    Fear of current or ex partner: No    Emotionally abused: No    Physically abused: No    Forced sexual activity: No  Other Topics Concern  . Not on file  Social History Narrative   Marital status:Married x 45 yrs. Happily married, no abuse.       Children:  2 children (daughter 10, son 24). Two grandsons and 2 step grandchildren.  1 gg.  Lives: with husband, daughter, son-in-law, 2 grandsons, and 1 on the way.      Employment: disability for peripheral neuropathy 2012.  Previously had home daycare.      Tobacco: none      Alcohol: none      Drugs: none      Exercise: none. [employer].      Pets: dog.      Always uses seat belts, smoke detectors in home.      No guns in the home.       Caffeine use: 2 cups coffee per day.       Nutrition: Well balanced diet.   Family History  Problem Relation Age of Onset  . Diabetes Mother   . Hypertension Mother   . Arthritis Mother        knees, lumbar DDD, cervical DDD  . Cancer Father        prostate,skin,lymphoma.  . Cancer Brother 45       bladder cancer; non-smoker  . Diabetes Maternal Grandmother   . Heart disease Maternal Grandmother   . COPD Maternal Grandmother   . Diabetes Paternal Grandmother   . Obesity  Brother   . Diabetes Son   . Hypertension Son   . Cancer Maternal Grandfather   . Diabetes Paternal Grandfather        Objective:    BP 118/72   Pulse 70   Temp 97.8 F (36.6 C) (Oral)   Resp 16   Ht 5' 8.5" (1.74 m)   Wt 292 lb (132.5 kg)   SpO2 98%   BMI 43.75 kg/m  Physical Exam  Constitutional: She is oriented to person, place, and time. She appears well-developed and well-nourished. No distress.  HENT:  Head: Normocephalic and atraumatic.  Right Ear: External ear normal.  Left Ear: External ear normal.  Nose: Nose normal.  Mouth/Throat: Oropharynx is clear and moist.  Eyes: Pupils are equal, round, and reactive to light. Conjunctivae and EOM are normal.  Neck: Normal range of motion and full passive range of motion without pain. Neck supple. No JVD present. Carotid bruit is not present. No thyromegaly present.  Cardiovascular: Normal rate, regular rhythm and normal heart sounds. Exam reveals no gallop and no friction rub.  No murmur heard. Trace pitting edema to lower ankles bilaterally  Pulmonary/Chest: Effort normal and breath sounds normal. She has no wheezes. She has no rales.  Abdominal: Soft. Bowel sounds are normal. She exhibits no distension and no mass. There is no tenderness. There is no rebound and no guarding.  Musculoskeletal: She exhibits edema.       Right shoulder: Normal.       Left shoulder: Normal.       Cervical back: Normal.  Lymphadenopathy:    She has no cervical adenopathy.  Neurological: She is alert and oriented to person, place, and time. She has normal reflexes. No cranial nerve deficit. She exhibits normal muscle tone. Coordination normal.  Skin: Skin is warm and dry. No rash noted. She is not diaphoretic. No erythema. No pallor.  Psychiatric: She has a normal mood and affect. Her behavior is normal. Judgment and thought content normal.  Nursing note and vitals reviewed.  No results found. Depression screen [facility] 2/9 04/15/2018 02/24/2018  01/12/2018 09/27/2017 09/08/2017  Decreased Interest 0 0 0 0 0  Down, Depressed, Hopeless 0 0 0 1 0  PHQ - 2 Score 0 0 0 1 0  Altered sleeping - - - 0 -  Tired, decreased energy - - - 1 -  Change in appetite - - - 3 -  Feeling bad or failure about yourself  - - - 1 -  Trouble concentrating - - - 0 -  Moving slowly or fidgety/restless - - - 0 -  Suicidal thoughts - - - 0 -  PHQ-9 Score - - - 6 -  Difficult doing work/chores - - - Not difficult at all -  Some recent data might be hidden   Fall Risk  04/15/2018 02/24/2018 01/12/2018 09/27/2017 09/08/2017  Falls in the past year? No No No Yes Yes  Number falls in past yr: - - - 1 1  Injury with Fall? - - - Yes Yes  Comment - - - left elbow Scrapped her elbow and knee  Risk for fall due to : - - - - (No Data)  Risk for fall due to: Comment - - - - Patient loss balance reaching for something and fell.  Follow up - - - - Falls prevention discussed        Assessment & Plan:   1. Diabetic peripheral neuropathy associated with type 2 diabetes mellitus (HCC)   2. Essential hypertension, benign   3. OSA (obstructive sleep apnea)   4. Pure hypercholesterolemia   5. Other iron deficiency anemia   6. Chronic systolic heart failure (HCC)   7. Chronic renal impairment, stage 3 (moderate) (HCC)   8. History of CVA (cerebrovascular accident)   9. History of amputation of lesser toe of right foot (HCC)   10. Paroxysmal atrial fibrillation (HCC)   11. Morbid obesity (HCC)   12. Atherosclerosis of native coronary artery of native heart without angina pectoris   13. ICD (implantable cardioverter-defibrillator) in place     Diabetes mellitus type 2 insulin-dependent with peripheral neuropathy and history of right toe amputation: Uncontrolled.  Initiate Trulicity therapy with hopes of weight loss.  Will likely need to discontinue Amaryl therapy and initiate NovoLog therapy.  Obtain labs for chronic disease management.  Followed by neurology for neuropathy  as well as nephropathy for renal insufficiency secondary to uncontrolled diabetes.  Highly encouraged compliance with diet and weight loss.  Chronic systolic congestive heart failure with coronary artery disease and paroxysmal atrial fibrillation: Followed closely by cardiology and has defibrillator placement.  Mild volume overload today.  Refill of diuretic provided.  Cannot tolerate ACE inhibitor or ARB due to hyperkalemia.  Continue Plavix therapy per cardiology.  Continue carvedilol.  History of CVA: Without focal neurological deficits.  Warrants aggressive control of hypertension, diabetes, hypercholesterolemia.  Diabetic peripheral neuropathy: Stable at this time on gabapentin and tramadol therapies.  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  Orders Placed This Encounter  Procedures  . CBC with Differential/Platelet  . Comprehensive metabolic panel    Order Specific Question:   Has the patient fasted?    Answer:   No  . Lipid panel    Order Specific Question:   Has the patient fasted?    Answer:   No  . Hemoglobin A1c  . POCT glucose (manual entry)   Meds ordered this encounter  Medications  . citalopram (CELEXA) 20 MG tablet    Sig: Take 1 tablet (20 mg total) by mouth daily.    Dispense:  90 tablet    Refill:  1  . clopidogrel (PLAVIX) 75 MG tablet    Sig: Take 1 tablet (75 mg total) by mouth daily.  Dispense:  90 tablet    Refill:  3  . gabapentin (NEURONTIN) 600 MG tablet    Sig: Take 1 tablet (600 mg total) by mouth 3 (three) times daily.    Dispense:  270 tablet    Refill:  1  . Insulin Glargine (LANTUS) 100 UNIT/ML Solostar Pen    Sig: Inject 30 Units into the skin at bedtime.    Dispense:  15 mL    Refill:  11  . torsemide (DEMADEX) 20 MG tablet    Sig: Take 1 tablet (20 mg total) by mouth daily.    Dispense:  90 tablet    Refill:  1    Pt needs to contact office to schedule future appointment with Dr.  Kirke Corin. [phone]  . traMADol (ULTRAM) 50 MG tablet    Sig: Take 1 tablet (50 mg total) by mouth every 6 (six) hours as needed. for pain    Dispense:  120 tablet    Refill:  5  . Dulaglutide (TRULICITY) 0.75 MG/0.5ML SOPN    Sig: Inject 0.75 mg into the skin once a week.    Dispense:  2 mL    Refill:  5  . glimepiride (AMARYL) 4 MG tablet    Sig: Take 1 tablet (4 mg total) by mouth 2 (two) times daily with a meal.    Dispense:  180 tablet    Refill:  3    Return in about 3 months (around 07/16/2018) for follow-up chronic medical conditions SANT[place]AGO.   Breuna Loveall Paulita Fujita, M.D. Primary Care at [facility] previously [facility] [address] [place], [place]  [address] [phone] phone [phone] fax

## 2018-04-15 ENCOUNTER — Other Ambulatory Visit: Payer: Self-pay

## 2018-04-15 ENCOUNTER — Encounter: Payer: Self-pay | Admitting: Family Medicine

## 2018-04-15 ENCOUNTER — Ambulatory Visit: Payer: PPO | Admitting: Family Medicine

## 2018-04-15 VITALS — BP 118/72 | HR 70 | Temp 97.8°F | Resp 16 | Ht 68.5 in | Wt 292.0 lb

## 2018-04-15 DIAGNOSIS — N183 Chronic kidney disease, stage 3 unspecified: Secondary | ICD-10-CM

## 2018-04-15 DIAGNOSIS — D631 Anemia in chronic kidney disease: Secondary | ICD-10-CM | POA: Diagnosis not present

## 2018-04-15 DIAGNOSIS — Z89421 Acquired absence of other right toe(s): Secondary | ICD-10-CM | POA: Diagnosis not present

## 2018-04-15 DIAGNOSIS — Z8673 Personal history of transient ischemic attack (TIA), and cerebral infarction without residual deficits: Secondary | ICD-10-CM | POA: Diagnosis not present

## 2018-04-15 DIAGNOSIS — I2510 Atherosclerotic heart disease of native coronary artery without angina pectoris: Secondary | ICD-10-CM | POA: Diagnosis not present

## 2018-04-15 DIAGNOSIS — D508 Other iron deficiency anemias: Secondary | ICD-10-CM | POA: Diagnosis not present

## 2018-04-15 DIAGNOSIS — E1122 Type 2 diabetes mellitus with diabetic chronic kidney disease: Secondary | ICD-10-CM | POA: Diagnosis not present

## 2018-04-15 DIAGNOSIS — I480 Paroxysmal atrial fibrillation: Secondary | ICD-10-CM | POA: Diagnosis not present

## 2018-04-15 DIAGNOSIS — R809 Proteinuria, unspecified: Secondary | ICD-10-CM | POA: Diagnosis not present

## 2018-04-15 DIAGNOSIS — I130 Hypertensive heart and chronic kidney disease with heart failure and stage 1 through stage 4 chronic kidney disease, or unspecified chronic kidney disease: Secondary | ICD-10-CM | POA: Diagnosis not present

## 2018-04-15 DIAGNOSIS — Z95810 Presence of automatic (implantable) cardiac defibrillator: Secondary | ICD-10-CM

## 2018-04-15 DIAGNOSIS — I5022 Chronic systolic (congestive) heart failure: Secondary | ICD-10-CM | POA: Diagnosis not present

## 2018-04-15 DIAGNOSIS — E1142 Type 2 diabetes mellitus with diabetic polyneuropathy: Secondary | ICD-10-CM

## 2018-04-15 DIAGNOSIS — N179 Acute kidney failure, unspecified: Secondary | ICD-10-CM | POA: Diagnosis not present

## 2018-04-15 DIAGNOSIS — I10 Essential (primary) hypertension: Secondary | ICD-10-CM

## 2018-04-15 DIAGNOSIS — G4733 Obstructive sleep apnea (adult) (pediatric): Secondary | ICD-10-CM

## 2018-04-15 DIAGNOSIS — E7800 Pure hypercholesterolemia, unspecified: Secondary | ICD-10-CM | POA: Diagnosis not present

## 2018-04-15 LAB — GLUCOSE, POCT (MANUAL RESULT ENTRY): POC Glucose: 158 mg/dl — AB (ref 70–99)

## 2018-04-15 MED ORDER — GLIMEPIRIDE 4 MG PO TABS: 4.0000 mg | ORAL_TABLET | Freq: Two times a day (BID) | ORAL | 3 refills | Status: DC

## 2018-04-15 MED ORDER — INSULIN GLARGINE 100 UNIT/ML SOLOSTAR PEN: 30.0000 [IU] | PEN_INJECTOR | Freq: Every day | SUBCUTANEOUS | 11 refills | Status: DC

## 2018-04-15 MED ORDER — DULAGLUTIDE 0.75 MG/0.5ML ~~LOC~~ SOAJ: 0.7500 mg | SUBCUTANEOUS | 5 refills | Status: DC

## 2018-04-15 MED ORDER — TRAMADOL HCL 50 MG PO TABS: 50.0000 mg | ORAL_TABLET | Freq: Four times a day (QID) | ORAL | 5 refills | Status: DC | PRN

## 2018-04-15 MED ORDER — GABAPENTIN 600 MG PO TABS: 600.0000 mg | ORAL_TABLET | Freq: Three times a day (TID) | ORAL | 1 refills | Status: DC

## 2018-04-15 MED ORDER — TORSEMIDE 20 MG PO TABS: 20.0000 mg | ORAL_TABLET | Freq: Every day | ORAL | 1 refills | Status: DC

## 2018-04-15 MED ORDER — CLOPIDOGREL BISULFATE 75 MG PO TABS: 75.0000 mg | ORAL_TABLET | Freq: Every day | ORAL | 3 refills | Status: DC

## 2018-04-15 MED ORDER — CITALOPRAM HYDROBROMIDE 20 MG PO TABS: 20.0000 mg | ORAL_TABLET | Freq: Every day | ORAL | 1 refills | Status: DC

## 2018-04-15 NOTE — Patient Instructions (Addendum)
   [facility]   IF you received an x-ray today, you will receive an invoice from [facility]. Please contact [facility] at [phone] with questions or concerns regarding your invoice.   IF you received labwork today, you will receive an invoice from [place]. Please contact [facility] at [phone] with questions or concerns regarding your invoice.   Our billing staff will not be able to assist you with questions regarding bills from these companies.  You will be contacted with the lab results as soon as they are available. The fastest way to get your results is to activate your My Chart account. Instructions are located on the last page of this paperwork. If you have not heard from [place] regarding the results in 2 weeks, please contact this office.

## 2018-04-16 LAB — COMPREHENSIVE METABOLIC PANEL
A/G RATIO: 1.6 (ref 1.2–2.2)
ALBUMIN: 4.1 g/dL (ref 3.6–4.8)
ALK PHOS: 81 IU/L (ref 39–117)
ALT: 12 IU/L (ref 0–32)
AST: 18 IU/L (ref 0–40)
BUN / CREAT RATIO: 19 (ref 12–28)
BUN: 25 mg/dL (ref 8–27)
Bilirubin Total: 0.2 mg/dL (ref 0.0–1.2)
CALCIUM: 9 mg/dL (ref 8.7–10.3)
CO2: 25 mmol/L (ref 20–29)
CREATININE: 1.33 mg/dL — AB (ref 0.57–1.00)
Chloride: 103 mmol/L (ref 96–106)
GFR calc Af Amer: 49 mL/min/{1.73_m2} — ABNORMAL LOW (ref >59)
GFR, EST NON AFRICAN AMERICAN: 42 mL/min/{1.73_m2} — AB (ref >59)
GLOBULIN, TOTAL: 2.5 g/dL (ref 1.5–4.5)
Glucose: 150 mg/dL — ABNORMAL HIGH (ref 65–99)
Potassium: 5.7 mmol/L — ABNORMAL HIGH (ref 3.5–5.2)
SODIUM: 141 mmol/L (ref 134–144)
Total Protein: 6.6 g/dL (ref 6.0–8.5)

## 2018-04-16 LAB — CBC WITH DIFFERENTIAL/PLATELET
BASOS: 0 %
Basophils Absolute: 0 10*3/uL (ref 0.0–0.2)
EOS (ABSOLUTE): 0.1 10*3/uL (ref 0.0–0.4)
EOS: 1 %
HEMATOCRIT: 38 % (ref 34.0–46.6)
HEMOGLOBIN: 12.3 g/dL (ref 11.1–15.9)
Immature Grans (Abs): 0 10*3/uL (ref 0.0–0.1)
Immature Granulocytes: 0 %
LYMPHS ABS: 1.7 10*3/uL (ref 0.7–3.1)
Lymphs: 20 %
MCH: 29.6 pg (ref 26.6–33.0)
MCHC: 32.4 g/dL (ref 31.5–35.7)
MCV: 91 fL (ref 79–97)
MONOCYTES: 6 %
Monocytes Absolute: 0.5 10*3/uL (ref 0.1–0.9)
Neutrophils Absolute: 6 10*3/uL (ref 1.4–7.0)
Neutrophils: 73 %
Platelets: 178 10*3/uL (ref 150–450)
RBC: 4.16 x10E6/uL (ref 3.77–5.28)
RDW: 12.4 % (ref 12.3–15.4)
WBC: 8.2 10*3/uL (ref 3.4–10.8)

## 2018-04-16 LAB — HEMOGLOBIN A1C
ESTIMATED AVERAGE GLUCOSE: 166 mg/dL
Hgb A1c MFr Bld: 7.4 % — ABNORMAL HIGH (ref 4.8–5.6)

## 2018-04-16 LAB — LIPID PANEL
CHOL/HDL RATIO: 2.8 ratio (ref 0.0–4.4)
Cholesterol, Total: 91 mg/dL — ABNORMAL LOW (ref 100–199)
HDL: 32 mg/dL — ABNORMAL LOW (ref >39)
LDL CALC: 32 mg/dL (ref 0–99)
Triglycerides: 137 mg/dL (ref 0–149)
VLDL Cholesterol Cal: 27 mg/dL (ref 5–40)

## 2018-04-19 DIAGNOSIS — Z95810 Presence of automatic (implantable) cardiac defibrillator: Secondary | ICD-10-CM | POA: Insufficient documentation

## 2018-04-19 DIAGNOSIS — Z89421 Acquired absence of other right toe(s): Secondary | ICD-10-CM | POA: Insufficient documentation

## 2018-04-21 ENCOUNTER — Ambulatory Visit (INDEPENDENT_AMBULATORY_CARE_PROVIDER_SITE_OTHER): Payer: PPO | Admitting: *Deleted

## 2018-04-21 DIAGNOSIS — I255 Ischemic cardiomyopathy: Secondary | ICD-10-CM | POA: Diagnosis not present

## 2018-04-21 DIAGNOSIS — I5022 Chronic systolic (congestive) heart failure: Secondary | ICD-10-CM

## 2018-04-21 NOTE — Progress Notes (Signed)
Remote ICD transmission.   

## 2018-04-22 ENCOUNTER — Encounter: Payer: Self-pay | Admitting: Cardiology

## 2018-04-22 LAB — CUP PACEART REMOTE DEVICE CHECK
Brady Statistic AP VP Percent: 37.48 %
Brady Statistic AP VS Percent: 0.02 %
Brady Statistic AS VP Percent: 62.41 %
Brady Statistic RA Percent Paced: 37.3 %
Brady Statistic RV Percent Paced: 99.39 %
HighPow Impedance: 72 Ohm
Implantable Lead Implant Date: 20150930
Implantable Lead Implant Date: 20150930
Implantable Lead Location: 753857
Lead Channel Impedance Value: 304 Ohm
Lead Channel Impedance Value: 304 Ohm
Lead Channel Impedance Value: 304 Ohm
Lead Channel Impedance Value: 380 Ohm
Lead Channel Impedance Value: 494 Ohm
Lead Channel Impedance Value: 589 Ohm
Lead Channel Pacing Threshold Amplitude: 0.5 V
Lead Channel Pacing Threshold Amplitude: 0.75 V
Lead Channel Pacing Threshold Pulse Width: 0.4 ms
Lead Channel Pacing Threshold Pulse Width: 0.4 ms
Lead Channel Sensing Intrinsic Amplitude: 1.25 mV
Lead Channel Sensing Intrinsic Amplitude: 31.625 mV
Lead Channel Setting Pacing Amplitude: 1.5 V
Lead Channel Setting Pacing Amplitude: 2 V
Lead Channel Setting Pacing Pulse Width: 0.4 ms
Lead Channel Setting Sensing Sensitivity: 0.3 mV
MDC IDC LEAD IMPLANT DT: 20150930
MDC IDC LEAD LOCATION: 753859
MDC IDC LEAD LOCATION: 753860
MDC IDC MSMT BATTERY REMAINING LONGEVITY: 41 mo
MDC IDC MSMT BATTERY VOLTAGE: 2.96 V
MDC IDC MSMT LEADCHNL LV IMPEDANCE VALUE: 342 Ohm
MDC IDC MSMT LEADCHNL LV IMPEDANCE VALUE: 399 Ohm
MDC IDC MSMT LEADCHNL LV IMPEDANCE VALUE: 532 Ohm
MDC IDC MSMT LEADCHNL LV IMPEDANCE VALUE: 570 Ohm
MDC IDC MSMT LEADCHNL LV IMPEDANCE VALUE: 589 Ohm
MDC IDC MSMT LEADCHNL LV PACING THRESHOLD AMPLITUDE: 0.75 V
MDC IDC MSMT LEADCHNL LV PACING THRESHOLD PULSEWIDTH: 0.4 ms
MDC IDC MSMT LEADCHNL RA SENSING INTR AMPL: 1.25 mV
MDC IDC MSMT LEADCHNL RV IMPEDANCE VALUE: 399 Ohm
MDC IDC MSMT LEADCHNL RV IMPEDANCE VALUE: 456 Ohm
MDC IDC MSMT LEADCHNL RV SENSING INTR AMPL: 31.625 mV
MDC IDC PG IMPLANT DT: 20150930
MDC IDC SESS DTM: 20190716083724
MDC IDC SET LEADCHNL RV PACING AMPLITUDE: 2 V
MDC IDC SET LEADCHNL RV PACING PULSEWIDTH: 0.4 ms
MDC IDC STAT BRADY AS VS PERCENT: 0.09 %

## 2018-05-09 ENCOUNTER — Encounter: Payer: Self-pay | Admitting: Family Medicine

## 2018-05-09 ENCOUNTER — Other Ambulatory Visit: Payer: Self-pay | Admitting: Family Medicine

## 2018-05-09 DIAGNOSIS — E875 Hyperkalemia: Secondary | ICD-10-CM

## 2018-05-11 ENCOUNTER — Encounter: Payer: Self-pay | Admitting: *Deleted

## 2018-05-15 ENCOUNTER — Telehealth: Payer: Self-pay | Admitting: Family Medicine

## 2018-05-15 NOTE — Telephone Encounter (Signed)
Left a voicemail for [name] in regards to an appt she has with Dr. Pamella Pert on 07/16/2018. The provider is not going to be in the office on that day and would need to be rescheduled.

## 2018-06-11 ENCOUNTER — Encounter: Payer: Self-pay | Admitting: Neurology

## 2018-07-16 ENCOUNTER — Ambulatory Visit: Payer: PPO | Admitting: Family Medicine

## 2018-07-20 ENCOUNTER — Other Ambulatory Visit: Payer: Self-pay | Admitting: *Deleted

## 2018-07-20 ENCOUNTER — Other Ambulatory Visit: Payer: Self-pay

## 2018-07-20 ENCOUNTER — Encounter: Payer: Self-pay | Admitting: Family Medicine

## 2018-07-20 ENCOUNTER — Ambulatory Visit (INDEPENDENT_AMBULATORY_CARE_PROVIDER_SITE_OTHER): Payer: PPO | Admitting: Family Medicine

## 2018-07-20 VITALS — BP 113/70 | HR 61 | Temp 97.5°F | Ht 68.5 in | Wt 290.0 lb

## 2018-07-20 DIAGNOSIS — I5022 Chronic systolic (congestive) heart failure: Secondary | ICD-10-CM | POA: Diagnosis not present

## 2018-07-20 DIAGNOSIS — Z8673 Personal history of transient ischemic attack (TIA), and cerebral infarction without residual deficits: Secondary | ICD-10-CM

## 2018-07-20 DIAGNOSIS — E1142 Type 2 diabetes mellitus with diabetic polyneuropathy: Secondary | ICD-10-CM | POA: Diagnosis not present

## 2018-07-20 DIAGNOSIS — E7800 Pure hypercholesterolemia, unspecified: Secondary | ICD-10-CM | POA: Diagnosis not present

## 2018-07-20 DIAGNOSIS — N183 Chronic kidney disease, stage 3 unspecified: Secondary | ICD-10-CM

## 2018-07-20 DIAGNOSIS — I10 Essential (primary) hypertension: Secondary | ICD-10-CM

## 2018-07-20 DIAGNOSIS — Z23 Encounter for immunization: Secondary | ICD-10-CM

## 2018-07-20 LAB — POCT GLYCOSYLATED HEMOGLOBIN (HGB A1C): Hemoglobin A1C: 7.3 % — AB (ref 4.0–5.6)

## 2018-07-20 NOTE — Progress Notes (Signed)
10/14/20195:24 PM  Ritu Gagliardo Higginson Oct 05, 1956, 62 y.o. female [id number]  Chief Complaint  Patient presents with  . Depression    HPI:   Patient is a 62 y.o. female with past medical history significant for DM2 with neuropathy, HTN, CHF,  HLP, h/o CVA who presents today to establish care  Previous pcp Dr Tamala Julian Last appt July 5188  Started trulicity last visit, 4.16SA a week Was doing well until she went into the doughnut hole, last injection about middle of sept lantus 25 units, glimperide 68m once a day Fasting this morning 123 Last night 163 Checks fasting and bedtime Fasting: 67-174 Bedtime: 123-432 Time of dinner really affects her bedtime  Monitors salt intake Does not do daily weight  Has left foot drop, uses ASO braces Has had several falls when not wearing shoes   Depression doing well   Patient Care Team: Patient, No Pcp Per as PCP - General (General Practice) DCalvert Cantor MD as Consulting Physician (Ophthalmology) KLavonia Dana MD as Consulting Physician (Internal Medicine) GBarbaraann Faster RN as TSalemManagement  Lab Results  Component Value Date   HGBA1C 7.4 (H) 04/15/2018   HGBA1C 7.7 09/27/2017   HGBA1C 7.9 05/28/2017   Lab Results  Component Value Date   MICROALBUR 6.9 08/06/2016   LDLCALC 32 04/15/2018   CREATININE 1.33 (H) 04/15/2018    Fall Risk  07/20/2018 04/15/2018 02/24/2018 01/12/2018 09/27/2017  Falls in the past year? No No No No Yes  Number falls in past yr: - - - - 1  Injury with Fall? - - - - Yes  Comment - - - - left elbow  Risk for fall due to : - - - - -  Risk for fall due to: Comment - - - - -  Follow up - - - - -     [facility] 07/20/2018 04/15/2018 02/24/2018  Decreased Interest 0 0 0  Down, Depressed, Hopeless 0 0 0  PHQ - 2 Score 0 0 0  Altered sleeping - - -  Tired, decreased energy - - -  Change in appetite - - -  Feeling bad or failure about yourself  - - -    Trouble concentrating - - -  Moving slowly or fidgety/restless - - -  Suicidal thoughts - - -  PHQ-9 Score - - -  Difficult doing work/chores - - -  Some recent data might be hidden    No Known Allergies  Prior to Admission medications   Medication Sig Start Date End Date Taking? Authorizing Provider  AMBULATORY NON FORMULARY MEDICATION 1 Units by Other route once. Medication Name: foot brace 11/15/15  Yes Patel, Donika K, DO  Calcium Carb-Cholecalciferol (CALCIUM 600 + D PO) Take 1 tablet by mouth 2 (two) times daily.   Yes [provider]  carvedilol (COREG) 3.125 MG tablet TAKE 1 TABLET BY MOUTH TWICE A DAY 03/31/18  Yes KDeboraha Sprang MD  citalopram (CELEXA) 20 MG tablet Take 1 tablet (20 mg total) by mouth daily. 04/15/18  Yes SWardell Honour MD  clopidogrel (PLAVIX) 75 MG tablet Take 1 tablet (75 mg total) by mouth daily. 04/15/18  Yes SWardell Honour MD  Dulaglutide (TRULICITY) 06.30MZS/0.1UXSOPN Inject 0.75 mg into the skin once a week. 04/15/18  Yes SWardell Honour MD  gabapentin (NEURONTIN) 600 MG tablet Take 1 tablet (600 mg total) by mouth 3 (three) times daily. 04/15/18  Yes SWardell Honour  MD  glimepiride (AMARYL) 4 MG tablet Take 1 tablet (4 mg total) by mouth 2 (two) times daily with a meal. 04/15/18  Yes Wardell Honour, MD  glucose blood (ONE TOUCH ULTRA TEST) test strip Check sugar three times daily dx: DMII uncontrolled insulin dependent with complications 9/48/54  Yes Wardell Honour, MD  Insulin Glargine (LANTUS) 100 UNIT/ML Solostar Pen Inject 30 Units into the skin at bedtime. 04/15/18  Yes Wardell Honour, MD  Insulin Pen Needle (B-D ULTRAFINE III SHORT PEN) 31G X 8 MM MISC USE AS DIRECTED WITH LANTUS; dx: DMII uncontrolled, insulin dependent, with complications 04/02/02  Yes Wardell Honour, MD  LOSARTAN POTASSIUM PO Take 1 tablet by mouth daily.   Yes [provider]  Omega-3 Fatty Acids (FISH OIL) 1000 MG CAPS Take 1,000 mg by mouth 2 (two) times  daily.    Yes [provider]  rosuvastatin (CRESTOR) 40 MG tablet TAKE 1 TABLET BY MOUTH ONCE DAILY 12/16/17  Yes Gollan, Kathlene November, MD  torsemide (DEMADEX) 20 MG tablet Take 1 tablet (20 mg total) by mouth daily. 04/15/18  Yes Wardell Honour, MD  traMADol (ULTRAM) 50 MG tablet Take 1 tablet (50 mg total) by mouth every 6 (six) hours as needed. for pain 04/15/18  Yes Wardell Honour, MD    Past Medical History:  Diagnosis Date  . Acute myocardial infarction, subendocardial infarction, initial episode of care ([place]) 07/21/2012  . Acute osteomyelitis involving ankle and foot ([place]) 02/06/2015  . Acute systolic heart failure ([place]) 07/21/2012   New onset 07/19/12; admission to [facility] ED. Elevated Troponins.  S/p 2D-echo with EF 20-25%.  S/p cardiac catheterization with stenting LAD.  Repeat 2D-echo 10/2011 with improved EF of 35%.   . Anemia   . Automatic implantable cardioverter-defibrillator in situ    a. MDT CRT-D 06/2014, SN: [id number]    . CAD (coronary artery disease)    a. cardiac cath 101/04/2012: PCI/DES to chronically occluded mLAD, consideration PCI to diag branch in 4 weeks.   . Cataract   . Chicken pox   . Chronic systolic CHF (congestive heart failure) (HCC)    a. mixed ICM & NICM; b. EF 20-25% by echo 07/2012, mid-dist 2/3 of LV sev HK/AK, mild MR. echo 10/2012: EF 30-35%, sev HK ant-septal & inf walls, GR1DD, mild MR, PASP 33. c. echo 02/2013: EF 30%, GR1DD, mild MR. echo 04/2014: EF 30%, Septal-lat dyssynchrony, global HK, inf AK, GR1DD, mild MR. d. echo 10/2014: EF50-55%, WM nl, GR1DD, septal mild paradox. e. echo 02/2015: EF 50-55%, wm   . Depression   . Heart attack ([place])   . Heel ulcer ([place]) 04/27/2015  . History of blood transfusion ~ 2011   "plasma; had neuropathy; couldn't walk"  . Hypertension   . LBBB (left bundle branch block)   . Neuromuscular disorder ([place])   . Neuropathy 2011  . Obesity, unspecified   . OSA on CPAP    Moderate with AHI 23/hr and now on CPAP  at 16cm H2O.  Her DME is [facility]  . Pure hypercholesterolemia   . Type II diabetes mellitus ([place])   . Unspecified vitamin D deficiency     Past Surgical History:  Procedure Laterality Date  . AMPUTATION TOE Right 06/18/2015   Procedure: AMPUTATION TOE;  Surgeon: Samara Deist, DPM;  Location: [facility] ORS;  Service: Podiatry;  Laterality: Right;  . BACK SURGERY    . BI-VENTRICULAR IMPLANTABLE CARDIOVERTER DEFIBRILLATOR N/A 07/06/2014   Procedure: BI-VENTRICULAR  IMPLANTABLE CARDIOVERTER DEFIBRILLATOR  (CRT-D);  Surgeon: Deboraha Sprang, MD;  Location: [facility] CATH LAB;  Service: Cardiovascular;  Laterality: N/A;  . BI-VENTRICULAR IMPLANTABLE CARDIOVERTER DEFIBRILLATOR  (CRT-D)  07/06/2014  . BILATERAL OOPHORECTOMY  01/2011   ovarian cyst benign  . COLONOSCOPY WITH PROPOFOL Left 02/22/2015   Procedure: COLONOSCOPY WITH PROPOFOL;  Surgeon: Hulen Luster, MD;  Location: [facility] ENDOSCOPY;  Service: Endoscopy;  Laterality: Left;  . CORONARY ANGIOPLASTY WITH STENT PLACEMENT Left 07/2012   new onset systolic CHF; elevated troponins.  Cardiac catheterization with stenting to LAD; EF 15%.  2D-echo: EF 20-25%.  . ESOPHAGOGASTRODUODENOSCOPY N/A 02/22/2015   Procedure: ESOPHAGOGASTRODUODENOSCOPY (EGD);  Surgeon: Hulen Luster, MD;  Location: [facility] ENDOSCOPY;  Service: Endoscopy;  Laterality: N/A;  . INCISION AND DRAINAGE ABSCESS Right 2007   groin; with ICU stay due to sepsis.  Marland Kitchen LAPAROSCOPIC CHOLECYSTECTOMY  2011  . LEFT HEART CATHETERIZATION WITH CORONARY ANGIOGRAM N/A 07/21/2012   Procedure: LEFT HEART CATHETERIZATION WITH CORONARY ANGIOGRAM;  Surgeon: Jolaine Artist, MD;  [facility] LAB;  Service: Cardiovascular;  Laterality: N/A;  . [place]   L4-5  . PERCUTANEOUS CORONARY STENT INTERVENTION (PCI-S) N/A 07/23/2012   Procedure: PERCUTANEOUS CORONARY STENT INTERVENTION (PCI-S);  Surgeon: Sherren Mocha, MD;  Location[facility] C[facility] LAB;  Service: Cardiovascular;  Laterality: N/A;  . PERIPHERAL VASCULAR  CATHETERIZATION N/A 02/10/2015   Procedure: Picc Line Insertion;  Surgeon: Katha Cabal, MD;  Location: [facility] CV LAB;  Service: Cardiovascular;  Laterality: N/A;  . TUBAL LIGATION  1981  . VAGINAL HYSTERECTOMY  01/2011   Fibroids/DUB.  Ovaries removed. [place].    Social History   Tobacco Use  . Smoking status: Never Smoker  . Smokeless tobacco: Never Used  Substance Use Topics  . Alcohol use: No    Alcohol/week: 0.0 standard drinks    Family History  Problem Relation Age of Onset  . Diabetes Mother   . Hypertension Mother   . Arthritis Mother        knees, lumbar DDD, cervical DDD  . Cancer Father        prostate,skin,lymphoma.  . Cancer Brother 57       bladder cancer; non-smoker  . Diabetes Maternal Grandmother   . Heart disease Maternal Grandmother   . COPD Maternal Grandmother   . Diabetes Paternal Grandmother   . Obesity Brother   . Diabetes Son   . Hypertension Son   . Cancer Maternal Grandfather   . Diabetes Paternal Grandfather     Review of Systems  Constitutional: Negative for chills and fever.  Respiratory: Negative for cough and shortness of breath.   Cardiovascular: Negative for chest pain, palpitations and leg swelling.  Gastrointestinal: Negative for abdominal pain, nausea and vomiting.   OBJECTIVE:  Blood pressure 113/70, pulse 61, temperature (!) 97.5 F (36.4 C), temperature source Oral, height 5' 8.5" (1.74 m), weight 290 lb (131.5 kg), SpO2 98 %. Body mass index is 43.45 kg/m.   Wt Readings from Last 3 Encounters:  07/20/18 290 lb (131.5 kg)  04/15/18 292 lb (132.5 kg)  02/24/18 284 lb 12.8 oz (129.2 kg)    Physical Exam  Constitutional: She is oriented to person, place, and time. She appears well-developed and well-nourished.  HENT:  Head: Normocephalic and atraumatic.  Mouth/Throat: Oropharynx is clear and moist. No oropharyngeal exudate.  Eyes: Pupils are equal, round, and reactive to light. Conjunctivae and EOM are  normal. No scleral icterus.  Neck: Neck supple.  Cardiovascular: Normal rate, regular rhythm and normal heart sounds. Exam reveals no gallop and no friction rub.  No murmur heard. Pulmonary/Chest: Effort normal and breath sounds normal. She has no wheezes. She has no rales.  Musculoskeletal: She exhibits no edema.  Neurological: She is alert and oriented to person, place, and time.  Skin: Skin is warm and dry.  Psychiatric: She has a normal mood and affect.  Nursing note and vitals reviewed.   Results for orders placed or performed in visit on 07/20/18 (from the past 24 hour(s))  POCT glycosylated hemoglobin (Hb A1C)     Status: Abnormal   Collection Time: 07/20/18  5:41 PM  Result Value Ref Range   Hemoglobin A1C 7.3 (A) 4.0 - 5.6 %   HbA1c POC (<> result, manual entry)     HbA1c, POC (prediabetic range)     HbA1c, POC (controlled diabetic range)      ASSESSMENT and PLAN  1. Diabetic peripheral neuropathy associated with type 2 diabetes mellitus ([place]) Controlled. Having issues with medication coverage, she has reached out to her insurance. I provided application for [facility] - they make basaglar and trulicity. Consider OTC insulin.  - CMP14+EGFR - POCT glycosylated hemoglobin (Hb A1C)  2. Chronic kidney disease, stage III (moderate) (HCC) Checking labs today, medications will be adjusted as needed.   3. Essential hypertension, benign Controlled. Continue current regime.   4. Pure hypercholesterolemia Checking labs today, medications will be adjusted as needed.   5. Chronic systolic heart failure (HCC) Stable. No signs of fluid overload  6. History of CVA (cerebrovascular accident)  Other orders - Flu Vaccine QUAD 36+ mos IM  Return if symptoms worsen or fail to improve.    Rutherford Guys, MD Primary Care at [place], [name] [address] Ph.  [phone] Fax [phone]

## 2018-07-20 NOTE — Patient Instructions (Signed)
° ° ° °  If you have lab work done today you will be contacted with your lab results within the next 2 weeks.  If you have not heard from us then please contact us. The fastest way to get your results is to register for My Chart. ° ° °IF you received an x-ray today, you will receive an invoice from [facility]. Please contact [facility] at [phone] with questions or concerns regarding your invoice.  ° °IF you received labwork today, you will receive an invoice from [facility]. Please contact [facility] at [phone] with questions or concerns regarding your invoice.  ° °Our billing staff will not be able to assist you with questions regarding bills from these companies. ° °You will be contacted with the lab results as soon as they are available. The fastest way to get your results is to activate your My Chart account. Instructions are located on the last page of this paperwork. If you have not heard from us regarding the results in 2 weeks, please contact this office. °  ° ° ° °

## 2018-07-20 NOTE — Patient Outreach (Signed)
[facility]) Care Management  07/20/2018  [name] [birth year] [id number]   Telephone Screen  Referral Date: 07/20/18 Referral Source: HTA referral   Referral Reason: member is in the [place] and cannot afford her medication. She states she could not pick up her trulicity due to the cost and is almost out of her Lantus. Member is requesting  assistance with the cost of medication Insurance: HTA   Outreach attempt # 1 successful to home/mobile number Patient is able to verify HIPAA Reviewed and addressed referral to [facility] with patient She confirms she is having cost and management concerns related to Lantus and trulicity  She is completing her first appointment with Dr Grant Fontana who will be her primary MD now that she is no longer being seen by [name]  Social:  [name] is married She has the support of her family to include a husband and daughter. She drives to her medical appointments. She is independent with ADLs and iADLs    Conditions: HTN, paroxysmal atrial fibrillation , DM peripheral neuropathy associated with type 2 DM (07/20/18 A1c =7.3, was 7.4 on 04/21/18), OSA, left bundle branch block, chronic systolic CHF, ICD, hx of right foot toe amputation, class 3 obesity, hx of CVA, iron deficiency anemia, hyperlipidemia, chronic renal insufficiency, paraparesis of both lower limbs, polyradiculoneuropathy, atherosclerotic heart disease of native coronary artery without angina pectoris, depression, hx of MI, morbid obesity, Left foot drop with ASO braces    Medications: [name] has 13 pills  She has cost concerns with Lantus. She reports she has 1 Lantus Pen left that will last her only a few weeks  She reports before coverage gap concerns she was paying $40/month for Lantus. She is not sure what the new cost will be as she has not been to her pharmacy this month to get the new Lantus pen  She has cost concerns with Trulicity. She uses one pen a  week on Mondays, 0.75 mg a week. She reports she has not taken Trulicity since dose since September 2019  She reports her cbg readings during the day are good but for her evening/night readings she has had values from the 200s, 383, 490  Before the coverage gap concerns Trulicity cost was $26/STMHD and when she went to purchase it from her local pharmacy recently she was informed it would be $210/month  She can not afford $622/WLNLG for Trulicity. She reports speaking to Dr Pamella Pert 07/20/18 about these concerns and possible samples. No samples available  Dr Pamella Pert office has offered [name] patient assistance drug applications to complete She reports she has never received assistance from [facility]  She has not filled any medicines including these for the month of October 2019  She denies side effects of any medications The flu shot was given today 07/20/18  Falls- She confirms falls related to neuropathy and foot drop issues She is aware she needs to keep shoes on her feet at all times Reports 3 falls recently     Appointments: seen by Dr Pamella Pert on 07/20/18   Advance Directives: Denies need for assist with advance directives    Consent: [facility] RN CM reviewed [facility] services with patient. Patient gave verbal consent for services.  Plan: [facility] RN CM will be referred to [place] for medication assistance with Lantus and trulicity, medication management and polypharmacy. She denies need of services from [facility] Community/Telephonic RN CM, pharmacy, health coach, NP or SW at this time   [place].  Lavina Hamman, RN, BSN, [place] Coordinator Office number [phone] Mobile number [phone]  Main [facility] number [phone] Fax number [phone]

## 2018-07-21 ENCOUNTER — Telehealth: Payer: Self-pay | Admitting: Cardiology

## 2018-07-21 ENCOUNTER — Encounter: Payer: PPO | Admitting: *Deleted

## 2018-07-21 LAB — CMP14+EGFR
ALT: 15 IU/L (ref 0–32)
AST: 18 IU/L (ref 0–40)
Albumin/Globulin Ratio: 1.7 (ref 1.2–2.2)
Albumin: 4.5 g/dL (ref 3.6–4.8)
Alkaline Phosphatase: 79 IU/L (ref 39–117)
BUN/Creatinine Ratio: 22 (ref 12–28)
BUN: 43 mg/dL — ABNORMAL HIGH (ref 8–27)
Bilirubin Total: 0.4 mg/dL (ref 0.0–1.2)
CO2: 24 mmol/L (ref 20–29)
Calcium: 9.1 mg/dL (ref 8.7–10.3)
Chloride: 96 mmol/L (ref 96–106)
Creatinine, Ser: 2 mg/dL — ABNORMAL HIGH (ref 0.57–1.00)
GFR calc Af Amer: 30 mL/min/{1.73_m2} — ABNORMAL LOW (ref >59)
GFR calc non Af Amer: 26 mL/min/{1.73_m2} — ABNORMAL LOW (ref >59)
Globulin, Total: 2.6 g/dL (ref 1.5–4.5)
Glucose: 64 mg/dL — ABNORMAL LOW (ref 65–99)
Potassium: 4.5 mmol/L (ref 3.5–5.2)
Sodium: 140 mmol/L (ref 134–144)
Total Protein: 7.1 g/dL (ref 6.0–8.5)

## 2018-07-21 NOTE — Telephone Encounter (Signed)
Spoke with pt and reminded pt of remote transmission that is due today. Pt verbalized understanding.   

## 2018-07-23 ENCOUNTER — Other Ambulatory Visit: Payer: Self-pay | Admitting: Pharmacy Technician

## 2018-07-23 ENCOUNTER — Other Ambulatory Visit: Payer: Self-pay

## 2018-07-23 ENCOUNTER — Encounter: Payer: Self-pay | Admitting: Cardiology

## 2018-07-23 NOTE — Patient Outreach (Signed)
[facility] [address] [facility]) Care Management  [facility] CM Pharmacy   07/23/2018  [name] May 01, 1956 [id number]  Successful outreach to [name].  HIPAA identifiers verified.   Reason for referral: medication assistance Referral source: Telephonic [facility] RN Referral medication(s): Trulicity and Lantus Current insurance:HTA  PMHx: systolic heart failure, hypertension, atrial fibrillation, type 2 diabetes mellitus, paraparesis of both lower limbs, chronic renal insufficiency, h/o CVA, obesity, and hypercholesterolemia.   HPI:  [name] report that she just went into the coverage gap and cannot afford her Trulicity and Lantus.  She states that Trulicity cost ~$240/mo.  She has enough Lantus to last 2-3 more weeks and has not filled it yet.  She states that her PCP that manages her insulin does not have samples.  Patient reports that she no longer has atrial fibrillation and she is not on anticoagulation.   Objective: No Known Allergies   HgA1c was 7.3% on 07/20/18 SCr was 2 mg/dL on 56/38/75, calculated CrCl 32 ml/min, estimated GFR 36 ml/min  Medications Reviewed Today    Reviewed by Hurley Cisco, [facility] (Pharmacist) on 07/23/18 at 1048  Med List Status: <None>  Medication Order Taking? Sig Documenting Provider Last Dose Status Informant  AMBULATORY NON FORMULARY MEDICATION [id number]  1 Units by Other route once. Medication Name: foot brace Glendale Chard, DO  Active Self           Med Note Rolly Pancake   Tue Feb 24, 2018  8:42 PM)    Calcium Carb-Cholecalciferol (CALCIUM 600 + D PO) [id number] Yes Take 1 tablet by mouth 2 (two) times daily. [provider] Taking Active Self  carvedilol (COREG) 3.125 MG tablet [id number] Yes TAKE 1 TABLET BY MOUTH TWICE A DAY Duke Salvia, MD Taking Active   citalopram (CELEXA) 20 MG tablet [id number] Yes Take 1 tablet (20 mg total) by mouth daily. Ethelda Chick, MD Taking Active   clopidogrel (PLAVIX) 75 MG  tablet [id number] Yes Take 1 tablet (75 mg total) by mouth daily. Ethelda Chick, MD Taking Active   Dulaglutide (TRULICITY) 0.75 MG/0.5ML Namon Cirri [id number] Yes Inject 0.75 mg into the skin once a week.  Patient taking differently:  Inject 0.75 mg into the skin once a week. Mondays   Ethelda Chick, MD Taking Active   gabapentin (NEURONTIN) 600 MG tablet [id number] Yes Take 1 tablet (600 mg total) by mouth 3 (three) times daily. Ethelda Chick, MD Taking Active   glimepiride (AMARYL) 4 MG tablet [id number] Yes Take 1 tablet (4 mg total) by mouth 2 (two) times daily with a meal. Ethelda Chick, MD Taking Active   glucose blood (ONE TOUCH ULTRA TEST) test strip [id number] Yes Check sugar three times daily dx: DMII uncontrolled insulin dependent with complications Ethelda Chick, MD Taking Active Self  Insulin Glargine (LANTUS) 100 UNIT/ML Solostar Pen [id number] Yes Inject 30 Units into the skin at bedtime.  Patient taking differently:  Inject 30 Units into the skin at bedtime. Pt uses 20-30 units at bedtime based on her glucose.   Ethelda Chick, MD Taking Active   Insulin Pen Needle (B-D ULTRAFINE III SHORT PEN) 31G X 8 MM MISC [id number] Yes USE AS DIRECTED WITH LANTUS; dx: DMII uncontrolled, insulin dependent, with complications Ethelda Chick, MD Taking Active Self  LOSARTAN POTASSIUM PO [id number] Yes Take 25 mg by mouth daily.  [provider] Taking Active Pharmacy Records           Med  Note (Krisanne Lich, Lynita Lombard   Thu Jul 23, 2018 10:48 AM)    Omega-3 Fatty Acids (FISH OIL) 1000 MG CAPS [id number] Yes Take 1,000 mg by mouth 2 (two) times daily.  [provider] Taking Active Self  rosuvastatin (CRESTOR) 40 MG tablet [id number] Yes TAKE 1 TABLET BY MOUTH ONCE DAILY Gollan, Tollie Pizza, MD Taking Active Self  torsemide (DEMADEX) 20 MG tablet [id number] Yes Take 1 tablet (20 mg total) by mouth daily. Ethelda Chick, MD Taking Active   traMADol Janean Sark) 50 MG tablet [id number] Yes  Take 1 tablet (50 mg total) by mouth every 6 (six) hours as needed. for pain Ethelda Chick, MD Taking Active           Assessment:  Drugs sorted by system:  Neurologic/Psychologic: citalopram, gabapentin,   Cardiovascular: carvedilol, clopidogrel, losartan, rosuvastatin, omega 3 fish oil torsemide  Endocrine: dulaglutide, glimepiride, insulin glagine  Pain: tramadol  Vitamins/Minerals/Supplements: calcium w/vitamin D  Medication Review Findings:  Gabapentin- when CrCl >30-59 ml/min: oral: 200 to 700 mg twice daily.  When CrCl >15-29 ml/min: oral 200 to 700 mg once daily.  Recommend to change frequency. Medication Assistance Findings:  Extra Help:   []  Already receiving Full Extra Help  []  Already receiving Partial Extra Help  []  Eligible based on reported income and assets  [x]  Not Eligible based on reported income and assets  Patient Assistance Programs: 1) Trulicity  made by [employer] o Income requirement met: [x]  Yes []  No []  Unknown o Out-of-pocket prescription expenditure met:    []  Yes [x]  No  []  Unknown  []  Not applicable - [place] $517.94 - [name] reports being on disability.  She states that she is married and they have a lot of medical bills and a mortgage payment.  Plan to send financial hardship to [place] on patient's behalf.         2)  Lantus made by [employer] o Income requirement met: [x]  Yes []  No  []  Unknown o Out-of-pocket prescription expenditure met:   []  Yes [x]  No   []  Unknown []  Not applicable - Patient not eligible for assistance based on [employer] guidelines.    Additional medication assistance options reviewed with patient as warranted:  No other options identified   Plan: I will route patient assistance letter to [facility] pharmacy technician who will coordinate patient hardship letter for medication listed above.  [facility] pharmacy technician will assist with obtaining all required documents from both patient and provider(s) and submit application(s) once  completed.    Berlin Hun, PharmD Clinical Pharmacist [facility] [phone]

## 2018-07-23 NOTE — Patient Outreach (Signed)
[facility]) Care Management  07/23/2018  [name] 08-25-1956 [id number]   Received [employer] patient assistance referral from [place] [facility] with request for hardship letter. Prepared patient portion of application to be mailed along with hardship letter. Faxed provider portion to Dr. Pamella Pert.  Will follow up with patient in 5-7 business days to confirm application has been received.  Maud Deed Hardin, [place] Management [phone]

## 2018-07-27 ENCOUNTER — Ambulatory Visit (INDEPENDENT_AMBULATORY_CARE_PROVIDER_SITE_OTHER): Payer: PPO | Admitting: *Deleted

## 2018-07-27 DIAGNOSIS — I255 Ischemic cardiomyopathy: Secondary | ICD-10-CM

## 2018-07-27 DIAGNOSIS — I5022 Chronic systolic (congestive) heart failure: Secondary | ICD-10-CM

## 2018-07-28 ENCOUNTER — Encounter: Payer: Self-pay | Admitting: Internal Medicine

## 2018-07-28 ENCOUNTER — Ambulatory Visit: Payer: PPO | Admitting: Internal Medicine

## 2018-07-28 ENCOUNTER — Encounter: Payer: Self-pay | Admitting: Cardiology

## 2018-07-28 VITALS — BP 100/52 | HR 60 | Ht 68.0 in | Wt 286.5 lb

## 2018-07-28 DIAGNOSIS — Z95810 Presence of automatic (implantable) cardiac defibrillator: Secondary | ICD-10-CM

## 2018-07-28 DIAGNOSIS — I255 Ischemic cardiomyopathy: Secondary | ICD-10-CM | POA: Diagnosis not present

## 2018-07-28 DIAGNOSIS — I5022 Chronic systolic (congestive) heart failure: Secondary | ICD-10-CM

## 2018-07-28 NOTE — Progress Notes (Signed)
Remote ICD transmission.   

## 2018-07-28 NOTE — Patient Instructions (Signed)
Medication Instructions:  - Your physician has recommended you make the following change in your medication:   1) STOP coreg (carvedilol)  If you need a refill on your cardiac medications before your next appointment, please call your pharmacy.   Lab work: - none ordered  If you have labs (blood work) drawn today and your tests are completely normal, you will receive your results only by: Marland Kitchen MyChart Message (if you have MyChart) OR . A paper copy in the mail If you have any lab test that is abnormal or we need to change your treatment, we will call you to review the results.  Testing/Procedures: - Your physician has requested that you have an echocardiogram. Echocardiography is a painless test that uses sound waves to create images of your heart. It provides your doctor with information about the size and shape of your heart and how well your heart's chambers and valves are working. This procedure takes approximately one hour. There are no restrictions for this procedure. We do ask that you come well hydrated to the test as we occasionally have to start an IV to inject an image enhancer for better quality pictures.  Follow-Up: At [facility], you and your health needs are our priority.  As part of our continuing mission to provide you with exceptional heart care, we have created designated Provider Care Teams.  These Care Teams include your primary Cardiologist (physician) and Advanced Practice Providers (APPs -  Physician Assistants and Nurse Practitioners) who all work together to provide you with the care you need, when you need it. . You will need a follow up appointment in 6 months with Dr. Fletcher Anon & 1 year with Dr. Caryl Comes.  Please call our office 2 months in advance to schedule this appointment.   Remote monitoring is used to monitor your Pacemaker of ICD from home. This monitoring reduces the number of office visits required to check your device to one time per year. It allows [place] to keep  an eye on the functioning of your device to ensure it is working properly. You are scheduled for a device check from home on 10/26/18. You may send your transmission at any time that day. If you have a wireless device, the transmission will be sent automatically. After your physician reviews your transmission, you will receive a postcard with your next transmission date.   Any Other Special Instructions Will Be Listed Below (If Applicable). - N/A

## 2018-07-28 NOTE — Progress Notes (Signed)
Review    Electrophysiology Office Note   Date:  07/28/2018   ID:  [name], DOB 08/04/1956, MRN [id number]  PCP:  Rutherford Guys, MD  Cardiologist:  CHF Primary Electrophysiologist:    Virl Axe, MD    Chief Complaint  Patient presents with  . other    12 month follow up & defib check. Meds reviewed by the pt. verbally. "doing well." Pt. c/o feeling tired more than usual.      History of Present Illness: [name] is a 62 y.o. female  seen today for follow-up of CRT-D implantation undertaken 9/15. 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 There is marked interval improvement in LV function   She has morbid obesity.    She has OSA use of her CPAP was associated with erosion over her nose.  Denies chest pain or edema.  Major complaint is shortness of breath and worsening fatigue over the last couple of months.  She has long-standing low blood pressure.  She has been maintained on losartan presumably for diabetic protection by her nephrologist.  Follow closely.   Date Cr K TSH Hgb  7/17 1.53 5.0    8/18 1.96 5.4    7/19   1.46(4/19) 12.3  10/19 2.00 4.5       DATE TEST EF   9/15  echo  30 %   1 /17 echo  50 %       Past Medical History:  Diagnosis Date  . Acute myocardial infarction, subendocardial infarction, initial episode of care ([place]) 07/21/2012  . Acute osteomyelitis involving ankle and foot ([place]) 02/06/2015  . Acute systolic heart failure ([place]) 07/21/2012   New onset 07/19/12; admission to [facility] ED. Elevated Troponins.  S/p 2D-echo with EF 20-25%.  S/p cardiac catheterization with stenting LAD.  Repeat 2D-echo 10/2011 with improved EF of 35%.   . Anemia   . Automatic implantable cardioverter-defibrillator in situ    a. MDT CRT-D 06/2014, SN: [id number]    . CAD (coronary artery disease)    a. cardiac cath 101/04/2012: PCI/DES to chronically occluded mLAD, consideration PCI to diag  branch in 4 weeks.   . Cataract   . Chicken pox   . Chronic systolic CHF (congestive heart failure) (HCC)    a. mixed ICM & NICM; b. EF 20-25% by echo 07/2012, mid-dist 2/3 of LV sev HK/AK, mild MR. echo 10/2012: EF 30-35%, sev HK ant-septal & inf walls, GR1DD, mild MR, PASP 33. c. echo 02/2013: EF 30%, GR1DD, mild MR. echo 04/2014: EF 30%, Septal-lat dyssynchrony, global HK, inf AK, GR1DD, mild MR. d. echo 10/2014: EF50-55%, WM nl, GR1DD, septal mild paradox. e. echo 02/2015: EF 50-55%, wm   . Depression   . Heart attack ([place])   . Heel ulcer ([place]) 04/27/2015  . History of blood transfusion ~ 2011   "plasma; had neuropathy; couldn't walk"  . Hypertension   . LBBB (left bundle branch block)   . Neuromuscular disorder ([place])   . Neuropathy 2011  . Obesity, unspecified   . OSA on CPAP    Moderate with AHI 23/hr and now on CPAP at 16cm H2O.  Her DME is [facility]  . Pure hypercholesterolemia   . Type II diabetes mellitus ([place])   . Unspecified vitamin D deficiency    Past Surgical History:  Procedure Laterality Date  . AMPUTATION TOE Right 06/18/2015   Procedure: AMPUTATION TOE;  Surgeon: Samara Deist, DPM;  Location: [facility] ORS;  Service: Podiatry;  Laterality: Right;  . BACK SURGERY    . BI-VENTRICULAR IMPLANTABLE CARDIOVERTER DEFIBRILLATOR N/A 07/06/2014   Procedure: BI-VENTRICULAR IMPLANTABLE CARDIOVERTER DEFIBRILLATOR  (CRT-D);  Surgeon: Deboraha Sprang, MD;  Location: [facility] CATH LAB;  Service: Cardiovascular;  Laterality: N/A;  . BI-VENTRICULAR IMPLANTABLE CARDIOVERTER DEFIBRILLATOR  (CRT-D)  07/06/2014  . BILATERAL OOPHORECTOMY  01/2011   ovarian cyst benign  . COLONOSCOPY WITH PROPOFOL Left 02/22/2015   Procedure: COLONOSCOPY WITH PROPOFOL;  Surgeon: Hulen Luster, MD;  Location: [facility] ENDOSCOPY;  Service: Endoscopy;  Laterality: Left;  . CORONARY ANGIOPLASTY WITH STENT PLACEMENT Left 07/2012   new onset systolic CHF; elevated troponins.  Cardiac catheterization with stenting to LAD; EF 15%.  2D-echo: EF  20-25%.  . ESOPHAGOGASTRODUODENOSCOPY N/A 02/22/2015   Procedure: ESOPHAGOGASTRODUODENOSCOPY (EGD);  Surgeon: Hulen Luster, MD;  Location: [facility] ENDOSCOPY;  Service: Endoscopy;  Laterality: N/A;  . INCISION AND DRAINAGE ABSCESS Right 2007   groin; with ICU stay due to sepsis.  Marland Kitchen LAPAROSCOPIC CHOLECYSTECTOMY  2011  . LEFT HEART CATHETERIZATION WITH CORONARY ANGIOGRAM N/A 07/21/2012   Procedure: LEFT HEART CATHETERIZATION WITH CORONARY ANGIOGRAM;  Surgeon: Jolaine Artist, MD;  Location: [facility] CATH LAB;  Service: Cardiovascular;  Laterality: N/A;  . [place]   L4-5  . PERCUTANEOUS CORONARY STENT INTERVENTION (PCI-S) N/A 07/23/2012   Procedure: PERCUTANEOUS CORONARY STENT INTERVENTION (PCI-S);  Surgeon: Sherren Mocha, MD;  Location: [facility] CATH LAB;  Service: Cardiovascular;  Laterality: N/A;  . PERIPHERAL VASCULAR CATHETERIZATION N/A 02/10/2015   Procedure: Picc Line Insertion;  Surgeon: Katha Cabal, MD;  Location: [place] CV LAB;  Service: Cardiovascular;  Laterality: N/A;  . TUBAL LIGATION  1981  . VAGINAL HYSTERECTOMY  01/2011   Fibroids/DUB.  Ovaries removed. [place].     Current Outpatient Medications  Medication Sig Dispense Refill  . AMBULATORY NON FORMULARY MEDICATION 1 Units by Other route once. Medication Name: foot brace 1 Units 0  . Calcium Carb-Cholecalciferol (CALCIUM 600 + D PO) Take 1 tablet by mouth 2 (two) times daily.    . carvedilol (COREG) 3.125 MG tablet TAKE 1 TABLET BY MOUTH TWICE A DAY 60 tablet 3  . citalopram (CELEXA) 20 MG tablet Take 1 tablet (20 mg total) by mouth daily. 90 tablet 1  . clopidogrel (PLAVIX) 75 MG tablet Take 1 tablet (75 mg total) by mouth daily. 90 tablet 3  . Dulaglutide (TRULICITY) 4.19 QQ/2.2LN SOPN Inject 0.75 mg into the skin once a week. (Patient taking differently: Inject 0.75 mg into the skin once a week. Mondays) 2 mL 5  . gabapentin (NEURONTIN) 600 MG tablet Take 1 tablet (600 mg total) by mouth 3 (three) times  daily. 270 tablet 1  . glimepiride (AMARYL) 4 MG tablet Take 1 tablet (4 mg total) by mouth 2 (two) times daily with a meal. 180 tablet 3  . glucose blood (ONE TOUCH ULTRA TEST) test strip Check sugar three times daily dx: DMII uncontrolled insulin dependent with complications 989 each 11  . Insulin Glargine (LANTUS) 100 UNIT/ML Solostar Pen Inject 30 Units into the skin at bedtime. (Patient taking differently: Inject 30 Units into the skin at bedtime. Pt uses 20-30 units at bedtime based on her glucose.) 15 mL 11  . Insulin Pen Needle (B-D ULTRAFINE III SHORT PEN) 31G X 8 MM MISC USE AS DIRECTED WITH LANTUS; dx: DMII uncontrolled, insulin dependent, with complications 211 each 11  . LOSARTAN POTASSIUM PO Take 25 mg by  mouth daily.     . Omega-3 Fatty Acids (FISH OIL) 1000 MG CAPS Take 1,000 mg by mouth 2 (two) times daily.     . rosuvastatin (CRESTOR) 40 MG tablet TAKE 1 TABLET BY MOUTH ONCE DAILY 90 tablet 3  . torsemide (DEMADEX) 20 MG tablet Take 1 tablet (20 mg total) by mouth daily. 90 tablet 1  . traMADol (ULTRAM) 50 MG tablet Take 1 tablet (50 mg total) by mouth every 6 (six) hours as needed. for pain 120 tablet 5   No current facility-administered medications for this visit.     Allergies:   Patient has no known allergies.   Social History:  The patient  reports that she has never smoked. She has never used smokeless tobacco. She reports that she does not drink alcohol or use drugs.   Family History:  The patient's family history includes Arthritis in her mother; COPD in her maternal grandmother; Cancer in her father and maternal grandfather; Cancer (age of onset: 56) in her brother; Diabetes in her maternal grandmother, mother, paternal grandfather, paternal grandmother, and son; Heart disease in her maternal grandmother; Hypertension in her mother and son; Obesity in her brother.    ROS:  Please see the history of present illness.   .   All other systems are reviewed and negative.     PHYSICAL EXAM: VS:  BP (!) 100/52 (BP Location: Left Arm, Patient Position: Sitting, Cuff Size: Large)   Pulse 60   Ht 5\' 8"  (1.727 m)   Wt 286 lb 8 oz (130 kg)   BMI 43.56 kg/m  , BMI Body mass index is 43.56 kg/m. Well developed and nourished in no acute distress HENT normal Neck supple with JVP-flat Clear Regular rate and rhythm, no murmurs or gallops Abd-soft with active BS No Clubbing cyanosis edema Skin-warm and dry A & Oriented  Grossly normal sensory and motor function    EKG: sinus with P-synchronous/ AV  pacing BiV config   Device interrogation is reviewed today in detail.  See PaceArt for details.   Recent Labs: 01/12/2018: TSH 1.460 02/24/2018: B Natriuretic Peptide 170.1 04/15/2018: Hemoglobin 12.3; Platelets 178 07/20/2018: ALT 15; BUN 43; Creatinine, Ser 2.00; Potassium 4.5; Sodium 140    Lipid Panel     Component Value Date/Time   CHOL 91 (L) 04/15/2018 1532   TRIG 137 04/15/2018 1532   HDL 32 (L) 04/15/2018 1532   CHOLHDL 2.8 04/15/2018 1532   CHOLHDL 5.6 (H) 08/06/2016 1431   VLDL 34 (H) 08/06/2016 1431   LDLCALC 32 04/15/2018 1532     Wt Readings from Last 3 Encounters:  07/28/18 286 lb 8 oz (130 kg)  07/20/18 290 lb (131.5 kg)  04/15/18 292 lb (132.5 kg)      Other studies Reviewed:    ASSESSMENT AND PLAN: Ischemic cardiomyopathy-interval normalization   CRT-D  The patient's device was interrogated.  The information was reviewed. No changes were made in the programming.      CHF chronic diastolic  Abnormal QRS vector  Fatigue  Low blood pressure  Sleep apnea-untreated  Renal Insufficiency  Followed closely by renal   Fatigue may be related to her low blood pressure.  With interval normalization of LV function, will confirm it with an echo and discontinue her carvedilol.  There are data raising the concern about changes in LV function regarding discontinuation of carvedilol; however, in the context of CRT may be  different.  Euvolemic.  Fatigue may also be related to untreated  sleep apnea.  No orders of the defined types were placed in this encounter.    Disposition:   FU with 12 months  Signed, Virl Axe, MD  07/28/2018 11:16 AM     [facility] [address] [place] [address] [phone] (office) [phone] (fax)

## 2018-07-31 ENCOUNTER — Other Ambulatory Visit: Payer: Self-pay

## 2018-07-31 ENCOUNTER — Ambulatory Visit (INDEPENDENT_AMBULATORY_CARE_PROVIDER_SITE_OTHER): Payer: PPO

## 2018-07-31 DIAGNOSIS — I5022 Chronic systolic (congestive) heart failure: Secondary | ICD-10-CM | POA: Diagnosis not present

## 2018-07-31 DIAGNOSIS — Z95810 Presence of automatic (implantable) cardiac defibrillator: Secondary | ICD-10-CM

## 2018-07-31 DIAGNOSIS — I255 Ischemic cardiomyopathy: Secondary | ICD-10-CM | POA: Diagnosis not present

## 2018-08-10 ENCOUNTER — Ambulatory Visit: Payer: Self-pay | Admitting: Pharmacy Technician

## 2018-08-10 ENCOUNTER — Other Ambulatory Visit: Payer: Self-pay | Admitting: Pharmacy Technician

## 2018-08-10 NOTE — Patient Outreach (Signed)
[place] [facility]) Care Management  08/10/2018  [name] Feb 03, 1956 [id number]   Unsuccessful call to patient to confirm if patient assistance application has been received. HIPAA compliant voicemail left.  Will make 2nd call attempt in 3-4 business days.  Maud Deed Chana Bode [place] Certified Pharmacy Technician [place] Management Direct Dial:[phone]

## 2018-08-13 LAB — CUP PACEART INCLINIC DEVICE CHECK
Battery Voltage: 2.95 V
Brady Statistic AP VP Percent: 36.18 %
Brady Statistic AS VP Percent: 63.74 %
Brady Statistic AS VS Percent: 0.06 %
Brady Statistic RA Percent Paced: 36.06 %
Date Time Interrogation Session: 20191022145203
HighPow Impedance: 82 Ohm
Implantable Lead Implant Date: 20150930
Implantable Lead Implant Date: 20150930
Implantable Lead Location: 753857
Implantable Pulse Generator Implant Date: 20150930
Lead Channel Impedance Value: 304 Ohm
Lead Channel Impedance Value: 323 Ohm
Lead Channel Impedance Value: 380 Ohm
Lead Channel Impedance Value: 380 Ohm
Lead Channel Impedance Value: 456 Ohm
Lead Channel Impedance Value: 513 Ohm
Lead Channel Impedance Value: 532 Ohm
Lead Channel Impedance Value: 532 Ohm
Lead Channel Impedance Value: 570 Ohm
Lead Channel Impedance Value: 570 Ohm
Lead Channel Impedance Value: 589 Ohm
Lead Channel Pacing Threshold Amplitude: 0.5 V
Lead Channel Pacing Threshold Amplitude: 0.75 V
Lead Channel Pacing Threshold Pulse Width: 0.4 ms
Lead Channel Sensing Intrinsic Amplitude: 1.625 mV
Lead Channel Setting Pacing Pulse Width: 0.4 ms
MDC IDC LEAD IMPLANT DT: 20150930
MDC IDC LEAD LOCATION: 753859
MDC IDC LEAD LOCATION: 753860
MDC IDC MSMT BATTERY REMAINING LONGEVITY: 39 mo
MDC IDC MSMT LEADCHNL LV IMPEDANCE VALUE: 323 Ohm
MDC IDC MSMT LEADCHNL LV IMPEDANCE VALUE: 380 Ohm
MDC IDC MSMT LEADCHNL RA PACING THRESHOLD PULSEWIDTH: 0.4 ms
MDC IDC MSMT LEADCHNL RV PACING THRESHOLD AMPLITUDE: 0.75 V
MDC IDC MSMT LEADCHNL RV PACING THRESHOLD PULSEWIDTH: 0.4 ms
MDC IDC MSMT LEADCHNL RV SENSING INTR AMPL: 28.75 mV
MDC IDC SET LEADCHNL LV PACING AMPLITUDE: 2 V
MDC IDC SET LEADCHNL LV PACING PULSEWIDTH: 0.4 ms
MDC IDC SET LEADCHNL RA PACING AMPLITUDE: 1.5 V
MDC IDC SET LEADCHNL RV PACING AMPLITUDE: 2 V
MDC IDC SET LEADCHNL RV SENSING SENSITIVITY: 0.3 mV
MDC IDC STAT BRADY AP VS PERCENT: 0.02 %
MDC IDC STAT BRADY RV PERCENT PACED: 99.55 %

## 2018-08-14 ENCOUNTER — Ambulatory Visit: Payer: Self-pay | Admitting: Pharmacy Technician

## 2018-08-17 ENCOUNTER — Ambulatory Visit: Payer: Self-pay | Admitting: Pharmacy Technician

## 2018-08-17 ENCOUNTER — Other Ambulatory Visit: Payer: Self-pay | Admitting: Pharmacy Technician

## 2018-08-17 NOTE — Patient Outreach (Signed)
[place] [facility]) Care Management  08/17/2018  Palma Buster [place] [birth year] [id number]   Unsuccessful call #2 in regards to whether patient has received patient assistance application that was mailed out to her. Left HIPAA compliant voicemail.  Will make 3rd attempt in 2-3 business days.  Maud Deed Chana Bode [employer] Certified Pharmacy Technician [place] Management Direct Dial:[phone]

## 2018-08-18 LAB — CUP PACEART REMOTE DEVICE CHECK
Battery Voltage: 2.95 V
Brady Statistic AP VS Percent: 0.04 %
Brady Statistic AS VS Percent: 0.46 %
Brady Statistic RV Percent Paced: 98.94 %
HighPow Impedance: 72 Ohm
Implantable Lead Implant Date: 20150930
Implantable Lead Implant Date: 20150930
Implantable Lead Implant Date: 20150930
Implantable Lead Location: 753857
Implantable Lead Location: 753859
Implantable Lead Model: 4398
Implantable Lead Model: 5076
Lead Channel Impedance Value: 304 Ohm
Lead Channel Impedance Value: 323 Ohm
Lead Channel Impedance Value: 323 Ohm
Lead Channel Impedance Value: 380 Ohm
Lead Channel Impedance Value: 380 Ohm
Lead Channel Impedance Value: 532 Ohm
Lead Channel Impedance Value: 570 Ohm
Lead Channel Impedance Value: 589 Ohm
Lead Channel Pacing Threshold Amplitude: 0.5 V
Lead Channel Pacing Threshold Amplitude: 0.625 V
Lead Channel Pacing Threshold Pulse Width: 0.4 ms
Lead Channel Pacing Threshold Pulse Width: 0.4 ms
Lead Channel Sensing Intrinsic Amplitude: 1.5 mV
Lead Channel Sensing Intrinsic Amplitude: 29.5 mV
Lead Channel Setting Pacing Amplitude: 1.5 V
Lead Channel Setting Sensing Sensitivity: 0.3 mV
MDC IDC LEAD LOCATION: 753860
MDC IDC MSMT BATTERY REMAINING LONGEVITY: 39 mo
MDC IDC MSMT LEADCHNL LV IMPEDANCE VALUE: 399 Ohm
MDC IDC MSMT LEADCHNL LV IMPEDANCE VALUE: 532 Ohm
MDC IDC MSMT LEADCHNL LV IMPEDANCE VALUE: 589 Ohm
MDC IDC MSMT LEADCHNL LV PACING THRESHOLD AMPLITUDE: 0.75 V
MDC IDC MSMT LEADCHNL LV PACING THRESHOLD PULSEWIDTH: 0.4 ms
MDC IDC MSMT LEADCHNL RA SENSING INTR AMPL: 1.5 mV
MDC IDC MSMT LEADCHNL RV IMPEDANCE VALUE: 399 Ohm
MDC IDC MSMT LEADCHNL RV IMPEDANCE VALUE: 513 Ohm
MDC IDC MSMT LEADCHNL RV SENSING INTR AMPL: 29.5 mV
MDC IDC PG IMPLANT DT: 20150930
MDC IDC SESS DTM: 20191021183614
MDC IDC SET LEADCHNL LV PACING AMPLITUDE: 2 V
MDC IDC SET LEADCHNL LV PACING PULSEWIDTH: 0.4 ms
MDC IDC SET LEADCHNL RV PACING AMPLITUDE: 2 V
MDC IDC SET LEADCHNL RV PACING PULSEWIDTH: 0.4 ms
MDC IDC STAT BRADY AP VP PERCENT: 54.36 %
MDC IDC STAT BRADY AS VP PERCENT: 45.14 %
MDC IDC STAT BRADY RA PERCENT PACED: 54.13 %

## 2018-08-20 ENCOUNTER — Ambulatory Visit: Payer: Self-pay | Admitting: Pharmacy Technician

## 2018-08-20 ENCOUNTER — Telehealth: Payer: Self-pay | Admitting: Family Medicine

## 2018-08-20 ENCOUNTER — Other Ambulatory Visit: Payer: Self-pay | Admitting: Pharmacy Technician

## 2018-08-20 ENCOUNTER — Other Ambulatory Visit: Payer: Self-pay

## 2018-08-20 NOTE — Patient Outreach (Signed)
[place] [facility]) Care Management [place]  08/20/2018  Niana Martorana Carusone 11-02-1955 [id number]  Reason for referral: medication assistance for Trulicity and Lantus.  [facility] pharmacy case is being closed due to the following reasons:  Unable to establish contact with Ms. Klingensmith to determine if patient assistance applications received.    Thank you for allowing [facility] pharmacy to be involved in this patient's care.    Plan: Route discipline closure letter to PCP, Dr. Pamella Pert.  Joetta Manners, PharmD Clinical Pharmacist [place] [phone]

## 2018-08-20 NOTE — Telephone Encounter (Signed)
Copied from [place] [phone]. Topic: General - Other >> Aug 20, 2018  9:01 AM Lennox Solders wrote: Reason for CRM: [facility] pharm medicare part d is call and the pt needs  Medication therapy review for all medication. Please call [phone] ext [phone]

## 2018-08-20 NOTE — Patient Outreach (Signed)
[facility]) Care Management  08/20/2018  [name] September 19, 1956 [id number]   Unsuccessful call #3 to patient in reference to receiving patient assistance application. Left HIPAA compliant voicemail.  Will route note to [place] for case closure.  Maud Deed Chana Bode [place] Certified Pharmacy Technician [place] Management Direct Dial:[phone]

## 2018-09-01 NOTE — Telephone Encounter (Signed)
Please advise 

## 2018-09-01 NOTE — Telephone Encounter (Signed)
Will reconcile meds at next visit

## 2018-09-02 ENCOUNTER — Telehealth: Payer: Self-pay | Admitting: Family Medicine

## 2018-09-02 NOTE — Telephone Encounter (Signed)
Called and spoke with pt regarding their appt on 10/15/18. Due to provider schedule changes, we needed to get pt rescheduled. I was able to reschedule with Dr. Pamella Pert on 10/15/18 at 11:00. I advised of time, building and late policy. Pt acknowledged.

## 2018-09-06 DIAGNOSIS — A419 Sepsis, unspecified organism: Secondary | ICD-10-CM

## 2018-09-06 HISTORY — DX: Sepsis, unspecified organism: A41.9

## 2018-09-28 ENCOUNTER — Other Ambulatory Visit: Payer: Self-pay

## 2018-09-28 ENCOUNTER — Emergency Department: Payer: PPO

## 2018-09-28 ENCOUNTER — Inpatient Hospital Stay
Admission: EM | Admit: 2018-09-28 | Discharge: 2018-10-09 | DRG: 853 | Disposition: A | Discharge status: Home Health | Payer: PPO | Attending: Family Medicine | Admitting: Family Medicine

## 2018-09-28 DIAGNOSIS — E861 Hypovolemia: Secondary | ICD-10-CM | POA: Diagnosis present

## 2018-09-28 DIAGNOSIS — G9341 Metabolic encephalopathy: Secondary | ICD-10-CM | POA: Diagnosis present

## 2018-09-28 DIAGNOSIS — Z95810 Presence of automatic (implantable) cardiac defibrillator: Secondary | ICD-10-CM

## 2018-09-28 DIAGNOSIS — E669 Obesity, unspecified: Secondary | ICD-10-CM | POA: Diagnosis present

## 2018-09-28 DIAGNOSIS — I5043 Acute on chronic combined systolic (congestive) and diastolic (congestive) heart failure: Secondary | ICD-10-CM | POA: Diagnosis not present

## 2018-09-28 DIAGNOSIS — J9690 Respiratory failure, unspecified, unspecified whether with hypoxia or hypercapnia: Secondary | ICD-10-CM | POA: Diagnosis not present

## 2018-09-28 DIAGNOSIS — E118 Type 2 diabetes mellitus with unspecified complications: Secondary | ICD-10-CM | POA: Diagnosis not present

## 2018-09-28 DIAGNOSIS — R7989 Other specified abnormal findings of blood chemistry: Secondary | ICD-10-CM | POA: Diagnosis not present

## 2018-09-28 DIAGNOSIS — I248 Other forms of acute ischemic heart disease: Secondary | ICD-10-CM | POA: Diagnosis not present

## 2018-09-28 DIAGNOSIS — Z7902 Long term (current) use of antithrombotics/antiplatelets: Secondary | ICD-10-CM

## 2018-09-28 DIAGNOSIS — E559 Vitamin D deficiency, unspecified: Secondary | ICD-10-CM | POA: Diagnosis present

## 2018-09-28 DIAGNOSIS — M86172 Other acute osteomyelitis, left ankle and foot: Secondary | ICD-10-CM | POA: Diagnosis not present

## 2018-09-28 DIAGNOSIS — N179 Acute kidney failure, unspecified: Secondary | ICD-10-CM | POA: Diagnosis not present

## 2018-09-28 DIAGNOSIS — Z79899 Other long term (current) drug therapy: Secondary | ICD-10-CM

## 2018-09-28 DIAGNOSIS — L02612 Cutaneous abscess of left foot: Secondary | ICD-10-CM

## 2018-09-28 DIAGNOSIS — J9602 Acute respiratory failure with hypercapnia: Secondary | ICD-10-CM | POA: Diagnosis not present

## 2018-09-28 DIAGNOSIS — I10 Essential (primary) hypertension: Secondary | ICD-10-CM | POA: Diagnosis not present

## 2018-09-28 DIAGNOSIS — Z8249 Family history of ischemic heart disease and other diseases of the circulatory system: Secondary | ICD-10-CM

## 2018-09-28 DIAGNOSIS — E1122 Type 2 diabetes mellitus with diabetic chronic kidney disease: Secondary | ICD-10-CM | POA: Diagnosis not present

## 2018-09-28 DIAGNOSIS — I110 Hypertensive heart disease with heart failure: Secondary | ICD-10-CM | POA: Diagnosis not present

## 2018-09-28 DIAGNOSIS — Z8042 Family history of malignant neoplasm of prostate: Secondary | ICD-10-CM

## 2018-09-28 DIAGNOSIS — L97529 Non-pressure chronic ulcer of other part of left foot with unspecified severity: Secondary | ICD-10-CM | POA: Diagnosis present

## 2018-09-28 DIAGNOSIS — M545 Low back pain: Secondary | ICD-10-CM | POA: Diagnosis present

## 2018-09-28 DIAGNOSIS — I70245 Atherosclerosis of native arteries of left leg with ulceration of other part of foot: Secondary | ICD-10-CM | POA: Diagnosis not present

## 2018-09-28 DIAGNOSIS — Z825 Family history of asthma and other chronic lower respiratory diseases: Secondary | ICD-10-CM

## 2018-09-28 DIAGNOSIS — E1142 Type 2 diabetes mellitus with diabetic polyneuropathy: Secondary | ICD-10-CM | POA: Diagnosis not present

## 2018-09-28 DIAGNOSIS — Z89421 Acquired absence of other right toe(s): Secondary | ICD-10-CM

## 2018-09-28 DIAGNOSIS — I2510 Atherosclerotic heart disease of native coronary artery without angina pectoris: Secondary | ICD-10-CM | POA: Diagnosis present

## 2018-09-28 DIAGNOSIS — E1152 Type 2 diabetes mellitus with diabetic peripheral angiopathy with gangrene: Secondary | ICD-10-CM | POA: Diagnosis present

## 2018-09-28 DIAGNOSIS — E7800 Pure hypercholesterolemia, unspecified: Secondary | ICD-10-CM | POA: Diagnosis not present

## 2018-09-28 DIAGNOSIS — B9561 Methicillin susceptible Staphylococcus aureus infection as the cause of diseases classified elsewhere: Secondary | ICD-10-CM | POA: Diagnosis not present

## 2018-09-28 DIAGNOSIS — Z6841 Body Mass Index (BMI) 40.0 and over, adult: Secondary | ICD-10-CM

## 2018-09-28 DIAGNOSIS — N170 Acute kidney failure with tubular necrosis: Secondary | ICD-10-CM | POA: Diagnosis present

## 2018-09-28 DIAGNOSIS — I70262 Atherosclerosis of native arteries of extremities with gangrene, left leg: Secondary | ICD-10-CM | POA: Diagnosis not present

## 2018-09-28 DIAGNOSIS — I429 Cardiomyopathy, unspecified: Secondary | ICD-10-CM | POA: Diagnosis not present

## 2018-09-28 DIAGNOSIS — E11628 Type 2 diabetes mellitus with other skin complications: Secondary | ICD-10-CM | POA: Diagnosis not present

## 2018-09-28 DIAGNOSIS — A4101 Sepsis due to Methicillin susceptible Staphylococcus aureus: Secondary | ICD-10-CM | POA: Diagnosis not present

## 2018-09-28 DIAGNOSIS — M869 Osteomyelitis, unspecified: Secondary | ICD-10-CM | POA: Diagnosis present

## 2018-09-28 DIAGNOSIS — J9601 Acute respiratory failure with hypoxia: Secondary | ICD-10-CM

## 2018-09-28 DIAGNOSIS — N390 Urinary tract infection, site not specified: Secondary | ICD-10-CM | POA: Diagnosis not present

## 2018-09-28 DIAGNOSIS — M009 Pyogenic arthritis, unspecified: Secondary | ICD-10-CM | POA: Diagnosis present

## 2018-09-28 DIAGNOSIS — I96 Gangrene, not elsewhere classified: Secondary | ICD-10-CM | POA: Diagnosis not present

## 2018-09-28 DIAGNOSIS — R002 Palpitations: Secondary | ICD-10-CM | POA: Diagnosis not present

## 2018-09-28 DIAGNOSIS — I998 Other disorder of circulatory system: Secondary | ICD-10-CM | POA: Diagnosis not present

## 2018-09-28 DIAGNOSIS — R410 Disorientation, unspecified: Secondary | ICD-10-CM | POA: Diagnosis not present

## 2018-09-28 DIAGNOSIS — N189 Chronic kidney disease, unspecified: Secondary | ICD-10-CM | POA: Diagnosis not present

## 2018-09-28 DIAGNOSIS — I447 Left bundle-branch block, unspecified: Secondary | ICD-10-CM | POA: Diagnosis present

## 2018-09-28 DIAGNOSIS — I214 Non-ST elevation (NSTEMI) myocardial infarction: Secondary | ICD-10-CM | POA: Diagnosis not present

## 2018-09-28 DIAGNOSIS — R6520 Severe sepsis without septic shock: Secondary | ICD-10-CM

## 2018-09-28 DIAGNOSIS — R34 Anuria and oliguria: Secondary | ICD-10-CM | POA: Diagnosis not present

## 2018-09-28 DIAGNOSIS — I739 Peripheral vascular disease, unspecified: Secondary | ICD-10-CM

## 2018-09-28 DIAGNOSIS — A419 Sepsis, unspecified organism: Secondary | ICD-10-CM | POA: Diagnosis not present

## 2018-09-28 DIAGNOSIS — N184 Chronic kidney disease, stage 4 (severe): Secondary | ICD-10-CM | POA: Diagnosis not present

## 2018-09-28 DIAGNOSIS — I5022 Chronic systolic (congestive) heart failure: Secondary | ICD-10-CM | POA: Diagnosis not present

## 2018-09-28 DIAGNOSIS — I5033 Acute on chronic diastolic (congestive) heart failure: Secondary | ICD-10-CM | POA: Diagnosis not present

## 2018-09-28 DIAGNOSIS — Z8261 Family history of arthritis: Secondary | ICD-10-CM

## 2018-09-28 DIAGNOSIS — B9620 Unspecified Escherichia coli [E. coli] as the cause of diseases classified elsewhere: Secondary | ICD-10-CM | POA: Diagnosis not present

## 2018-09-28 DIAGNOSIS — I471 Supraventricular tachycardia: Secondary | ICD-10-CM | POA: Diagnosis not present

## 2018-09-28 DIAGNOSIS — Z8052 Family history of malignant neoplasm of bladder: Secondary | ICD-10-CM

## 2018-09-28 DIAGNOSIS — I21A1 Myocardial infarction type 2: Secondary | ICD-10-CM | POA: Diagnosis not present

## 2018-09-28 DIAGNOSIS — E11621 Type 2 diabetes mellitus with foot ulcer: Secondary | ICD-10-CM | POA: Diagnosis present

## 2018-09-28 DIAGNOSIS — E1140 Type 2 diabetes mellitus with diabetic neuropathy, unspecified: Secondary | ICD-10-CM | POA: Diagnosis not present

## 2018-09-28 DIAGNOSIS — I480 Paroxysmal atrial fibrillation: Secondary | ICD-10-CM | POA: Diagnosis present

## 2018-09-28 DIAGNOSIS — L97919 Non-pressure chronic ulcer of unspecified part of right lower leg with unspecified severity: Secondary | ICD-10-CM | POA: Diagnosis not present

## 2018-09-28 DIAGNOSIS — F329 Major depressive disorder, single episode, unspecified: Secondary | ICD-10-CM | POA: Diagnosis present

## 2018-09-28 DIAGNOSIS — R6521 Severe sepsis with septic shock: Secondary | ICD-10-CM | POA: Diagnosis not present

## 2018-09-28 DIAGNOSIS — M19172 Post-traumatic osteoarthritis, left ankle and foot: Secondary | ICD-10-CM | POA: Diagnosis not present

## 2018-09-28 DIAGNOSIS — M7989 Other specified soft tissue disorders: Secondary | ICD-10-CM | POA: Diagnosis not present

## 2018-09-28 DIAGNOSIS — Z807 Family history of other malignant neoplasms of lymphoid, hematopoietic and related tissues: Secondary | ICD-10-CM

## 2018-09-28 DIAGNOSIS — I5023 Acute on chronic systolic (congestive) heart failure: Secondary | ICD-10-CM | POA: Diagnosis not present

## 2018-09-28 DIAGNOSIS — I252 Old myocardial infarction: Secondary | ICD-10-CM | POA: Diagnosis not present

## 2018-09-28 DIAGNOSIS — L03032 Cellulitis of left toe: Secondary | ICD-10-CM

## 2018-09-28 DIAGNOSIS — Z8673 Personal history of transient ischemic attack (TIA), and cerebral infarction without residual deficits: Secondary | ICD-10-CM

## 2018-09-28 DIAGNOSIS — G4733 Obstructive sleep apnea (adult) (pediatric): Secondary | ICD-10-CM | POA: Diagnosis present

## 2018-09-28 DIAGNOSIS — I2582 Chronic total occlusion of coronary artery: Secondary | ICD-10-CM | POA: Diagnosis present

## 2018-09-28 DIAGNOSIS — I25118 Atherosclerotic heart disease of native coronary artery with other forms of angina pectoris: Secondary | ICD-10-CM | POA: Diagnosis not present

## 2018-09-28 DIAGNOSIS — R9431 Abnormal electrocardiogram [ECG] [EKG]: Secondary | ICD-10-CM | POA: Diagnosis not present

## 2018-09-28 DIAGNOSIS — D6959 Other secondary thrombocytopenia: Secondary | ICD-10-CM | POA: Diagnosis not present

## 2018-09-28 DIAGNOSIS — I255 Ischemic cardiomyopathy: Secondary | ICD-10-CM | POA: Diagnosis present

## 2018-09-28 DIAGNOSIS — Z794 Long term (current) use of insulin: Secondary | ICD-10-CM

## 2018-09-28 DIAGNOSIS — Z833 Family history of diabetes mellitus: Secondary | ICD-10-CM

## 2018-09-28 DIAGNOSIS — J9600 Acute respiratory failure, unspecified whether with hypoxia or hypercapnia: Secondary | ICD-10-CM

## 2018-09-28 DIAGNOSIS — G9340 Encephalopathy, unspecified: Secondary | ICD-10-CM | POA: Diagnosis not present

## 2018-09-28 DIAGNOSIS — I130 Hypertensive heart and chronic kidney disease with heart failure and stage 1 through stage 4 chronic kidney disease, or unspecified chronic kidney disease: Secondary | ICD-10-CM | POA: Diagnosis present

## 2018-09-28 DIAGNOSIS — R0603 Acute respiratory distress: Secondary | ICD-10-CM | POA: Diagnosis present

## 2018-09-28 DIAGNOSIS — R4182 Altered mental status, unspecified: Secondary | ICD-10-CM | POA: Diagnosis not present

## 2018-09-28 DIAGNOSIS — I70201 Unspecified atherosclerosis of native arteries of extremities, right leg: Secondary | ICD-10-CM | POA: Diagnosis not present

## 2018-09-28 DIAGNOSIS — M2042 Other hammer toe(s) (acquired), left foot: Secondary | ICD-10-CM | POA: Diagnosis not present

## 2018-09-28 DIAGNOSIS — R749 Abnormal serum enzyme level, unspecified: Secondary | ICD-10-CM | POA: Diagnosis not present

## 2018-09-28 DIAGNOSIS — R197 Diarrhea, unspecified: Secondary | ICD-10-CM | POA: Diagnosis not present

## 2018-09-28 DIAGNOSIS — G8929 Other chronic pain: Secondary | ICD-10-CM | POA: Diagnosis present

## 2018-09-28 DIAGNOSIS — R0689 Other abnormalities of breathing: Secondary | ICD-10-CM | POA: Diagnosis not present

## 2018-09-28 DIAGNOSIS — D696 Thrombocytopenia, unspecified: Secondary | ICD-10-CM | POA: Diagnosis not present

## 2018-09-28 DIAGNOSIS — R404 Transient alteration of awareness: Secondary | ICD-10-CM | POA: Diagnosis not present

## 2018-09-28 DIAGNOSIS — L089 Local infection of the skin and subcutaneous tissue, unspecified: Secondary | ICD-10-CM | POA: Diagnosis not present

## 2018-09-28 DIAGNOSIS — Z955 Presence of coronary angioplasty implant and graft: Secondary | ICD-10-CM | POA: Diagnosis not present

## 2018-09-28 DIAGNOSIS — E785 Hyperlipidemia, unspecified: Secondary | ICD-10-CM | POA: Diagnosis present

## 2018-09-28 DIAGNOSIS — E1165 Type 2 diabetes mellitus with hyperglycemia: Secondary | ICD-10-CM | POA: Diagnosis present

## 2018-09-28 DIAGNOSIS — E1151 Type 2 diabetes mellitus with diabetic peripheral angiopathy without gangrene: Secondary | ICD-10-CM | POA: Diagnosis not present

## 2018-09-28 DIAGNOSIS — R7881 Bacteremia: Secondary | ICD-10-CM | POA: Diagnosis not present

## 2018-09-28 DIAGNOSIS — I4891 Unspecified atrial fibrillation: Secondary | ICD-10-CM | POA: Diagnosis not present

## 2018-09-28 LAB — URINALYSIS, ROUTINE W REFLEX MICROSCOPIC
Bilirubin Urine: NEGATIVE
Glucose, UA: 150 mg/dL — AB
Ketones, ur: 5 mg/dL — AB
Nitrite: NEGATIVE
Protein, ur: 100 mg/dL — AB
Specific Gravity, Urine: 1.019 (ref 1.005–1.030)
pH: 5 (ref 5.0–8.0)

## 2018-09-28 LAB — BLOOD GAS, ARTERIAL
ALLENS TEST (PASS/FAIL): POSITIVE — AB
Acid-base deficit: 5 mmol/L — ABNORMAL HIGH (ref 0.0–2.0)
Bicarbonate: 18.6 mmol/L — ABNORMAL LOW (ref 20.0–28.0)
DELIVERY SYSTEMS: POSITIVE
Expiratory PAP: 8
FIO2: 1
Inspiratory PAP: 14
O2 Saturation: 99.8 %
Patient temperature: 37
RATE: 14 resp/min
pCO2 arterial: 30 mmHg — ABNORMAL LOW (ref 32.0–48.0)
pH, Arterial: 7.4 (ref 7.350–7.450)
pO2, Arterial: 221 mmHg — ABNORMAL HIGH (ref 83.0–108.0)

## 2018-09-28 LAB — BLOOD CULTURE ID PANEL (REFLEXED)

## 2018-09-28 LAB — COMPREHENSIVE METABOLIC PANEL
ALBUMIN: 3.4 g/dL — AB (ref 3.5–5.0)
ALT: 22 U/L (ref 0–44)
AST: 40 U/L (ref 15–41)
Alkaline Phosphatase: 51 U/L (ref 38–126)
Anion gap: 13 (ref 5–15)
BUN: 49 mg/dL — ABNORMAL HIGH (ref 8–23)
CO2: 22 mmol/L (ref 22–32)
Calcium: 8.2 mg/dL — ABNORMAL LOW (ref 8.9–10.3)
Chloride: 96 mmol/L — ABNORMAL LOW (ref 98–111)
Creatinine, Ser: 1.79 mg/dL — ABNORMAL HIGH (ref 0.44–1.00)
GFR calc Af Amer: 34 mL/min — ABNORMAL LOW (ref >60)
GFR calc non Af Amer: 29 mL/min — ABNORMAL LOW (ref >60)
Glucose, Bld: 390 mg/dL — ABNORMAL HIGH (ref 70–99)
Potassium: 3.4 mmol/L — ABNORMAL LOW (ref 3.5–5.1)
Sodium: 131 mmol/L — ABNORMAL LOW (ref 135–145)
Total Bilirubin: 1.3 mg/dL — ABNORMAL HIGH (ref 0.3–1.2)
Total Protein: 6.6 g/dL (ref 6.5–8.1)

## 2018-09-28 LAB — CBC WITH DIFFERENTIAL/PLATELET
ABS IMMATURE GRANULOCYTES: 0 10*3/uL (ref 0.00–0.07)
Basophils Absolute: 0 10*3/uL (ref 0.0–0.1)
Basophils Relative: 0 %
Eosinophils Absolute: 0.2 10*3/uL (ref 0.0–0.5)
Eosinophils Relative: 2 %
HCT: 35.2 % — ABNORMAL LOW (ref 36.0–46.0)
Hemoglobin: 11.8 g/dL — ABNORMAL LOW (ref 12.0–15.0)
Lymphocytes Relative: 12 %
Lymphs Abs: 1.3 10*3/uL (ref 0.7–4.0)
MCH: 30.1 pg (ref 26.0–34.0)
MCHC: 33.5 g/dL (ref 30.0–36.0)
MCV: 89.8 fL (ref 80.0–100.0)
Monocytes Absolute: 0.9 10*3/uL (ref 0.1–1.0)
Monocytes Relative: 8 %
NEUTROS ABS: 8.5 10*3/uL — AB (ref 1.7–7.7)
Neutrophils Relative %: 78 %
Platelets: 81 10*3/uL — ABNORMAL LOW (ref 150–400)
RBC: 3.92 MIL/uL (ref 3.87–5.11)
RDW: 12.8 % (ref 11.5–15.5)
WBC: 10.9 10*3/uL — ABNORMAL HIGH (ref 4.0–10.5)
nRBC: 0 % (ref 0.0–0.2)

## 2018-09-28 LAB — CG4 I-STAT (LACTIC ACID)
Lactic Acid, Venous: 2.93 mmol/L (ref 0.5–1.9)
Lactic Acid, Venous: 2.56 mmol/L (ref 0.5–1.9)

## 2018-09-28 LAB — MRSA PCR SCREENING: MRSA by PCR: NEGATIVE

## 2018-09-28 LAB — GLUCOSE, CAPILLARY
Glucose-Capillary: 381 mg/dL — ABNORMAL HIGH (ref 70–99)
Glucose-Capillary: 345 mg/dL — ABNORMAL HIGH (ref 70–99)

## 2018-09-28 LAB — TROPONIN I
TROPONIN I: 2.89 ng/mL — AB (ref <0.03)
Troponin I: 0.35 ng/mL (ref <0.03)
Troponin I: 5.2 ng/mL (ref <0.03)

## 2018-09-28 LAB — PROCALCITONIN: Procalcitonin: 24.21 ng/mL

## 2018-09-28 LAB — INFLUENZA PANEL BY PCR (TYPE A & B)
Influenza A By PCR: NEGATIVE
Influenza B By PCR: NEGATIVE

## 2018-09-28 MED ORDER — ACETAMINOPHEN 650 MG RE SUPP: 650.0000 mg | Freq: Four times a day (QID) | RECTAL | Status: DC | PRN

## 2018-09-28 MED ORDER — VANCOMYCIN HCL IN DEXTROSE 1-5 GM/200ML-% IV SOLN
1000.0000 mg | Freq: Once | INTRAVENOUS | Status: AC
  Administered 2018-09-28: 1000 mg via INTRAVENOUS
  Filled 2018-09-28: qty 200

## 2018-09-28 MED ORDER — SODIUM CHLORIDE 0.9 % IV SOLN
2.0000 g | Freq: Once | INTRAVENOUS | Status: AC
  Administered 2018-09-28: 2 g via INTRAVENOUS
  Filled 2018-09-28: qty 2

## 2018-09-28 MED ORDER — SODIUM CHLORIDE 0.9 % IV BOLUS
500.0000 mL | Freq: Once | INTRAVENOUS | Status: AC
  Administered 2018-09-28: 500 mL via INTRAVENOUS

## 2018-09-28 MED ORDER — ACETAMINOPHEN 650 MG RE SUPP
650.0000 mg | Freq: Once | RECTAL | Status: AC
  Administered 2018-09-28: 650 mg via RECTAL
  Filled 2018-09-28: qty 1

## 2018-09-28 MED ORDER — ONDANSETRON HCL 4 MG PO TABS: 4.0000 mg | ORAL_TABLET | Freq: Four times a day (QID) | ORAL | Status: DC | PRN

## 2018-09-28 MED ORDER — INSULIN ASPART 100 UNIT/ML ~~LOC~~ SOLN
0.0000 [IU] | SUBCUTANEOUS | Status: DC
  Administered 2018-09-28 – 2018-09-29 (×3): 11 [IU] via SUBCUTANEOUS
  Filled 2018-09-28 (×3): qty 1

## 2018-09-28 MED ORDER — ORAL CARE MOUTH RINSE
15.0000 mL | Freq: Two times a day (BID) | OROMUCOSAL | Status: DC
  Administered 2018-09-29 – 2018-10-03 (×4): 15 mL via OROMUCOSAL

## 2018-09-28 MED ORDER — LACTATED RINGERS IV SOLN
INTRAVENOUS | Status: DC
  Administered 2018-09-28 – 2018-09-29 (×2): via INTRAVENOUS

## 2018-09-28 MED ORDER — SODIUM CHLORIDE 0.9 % IV SOLN: INTRAVENOUS | Status: DC

## 2018-09-28 MED ORDER — ACETAMINOPHEN 325 MG PO TABS
650.0000 mg | ORAL_TABLET | Freq: Four times a day (QID) | ORAL | Status: DC | PRN
  Administered 2018-09-29: 650 mg via ORAL
  Filled 2018-09-28 (×2): qty 2

## 2018-09-28 MED ORDER — LORAZEPAM 2 MG/ML IJ SOLN
1.0000 mg | Freq: Once | INTRAMUSCULAR | Status: AC
  Administered 2018-09-28: 1 mg via INTRAVENOUS
  Filled 2018-09-28: qty 1

## 2018-09-28 MED ORDER — ONDANSETRON HCL 4 MG/2ML IJ SOLN
4.0000 mg | Freq: Four times a day (QID) | INTRAMUSCULAR | Status: DC | PRN
  Administered 2018-09-29 – 2018-09-30 (×3): 4 mg via INTRAVENOUS
  Filled 2018-09-28 (×3): qty 2

## 2018-09-28 MED ORDER — SODIUM CHLORIDE 0.9 % IV BOLUS
1000.0000 mL | Freq: Once | INTRAVENOUS | Status: AC
  Administered 2018-09-28: 1000 mL via INTRAVENOUS

## 2018-09-28 MED ORDER — CHLORHEXIDINE GLUCONATE 0.12 % MT SOLN
15.0000 mL | Freq: Two times a day (BID) | OROMUCOSAL | Status: DC
  Administered 2018-09-29 – 2018-10-06 (×7): 15 mL via OROMUCOSAL
  Filled 2018-09-28 (×13): qty 15

## 2018-09-28 MED ORDER — HEPARIN (PORCINE) 25000 UT/250ML-% IV SOLN
1400.0000 [IU]/h | INTRAVENOUS | Status: DC
  Administered 2018-09-29 – 2018-09-30 (×2): 1400 [IU]/h via INTRAVENOUS
  Filled 2018-09-28 (×2): qty 250

## 2018-09-28 MED ORDER — ENOXAPARIN SODIUM 40 MG/0.4ML ~~LOC~~ SOLN
40.0000 mg | SUBCUTANEOUS | Status: DC
  Administered 2018-09-28: 40 mg via SUBCUTANEOUS
  Filled 2018-09-28: qty 0.4

## 2018-09-28 MED ORDER — METRONIDAZOLE IN NACL 5-0.79 MG/ML-% IV SOLN
500.0000 mg | Freq: Three times a day (TID) | INTRAVENOUS | Status: DC
  Administered 2018-09-28 – 2018-09-29 (×3): 500 mg via INTRAVENOUS
  Filled 2018-09-28 (×5): qty 100

## 2018-09-28 MED ORDER — SODIUM CHLORIDE 0.9 % IV SOLN
2.0000 g | Freq: Two times a day (BID) | INTRAVENOUS | Status: DC
  Administered 2018-09-29: 2 g via INTRAVENOUS
  Filled 2018-09-28 (×3): qty 2

## 2018-09-28 MED ORDER — ASPIRIN EC 81 MG PO TBEC
81.0000 mg | DELAYED_RELEASE_TABLET | Freq: Every day | ORAL | Status: DC
  Administered 2018-09-29 – 2018-10-09 (×11): 81 mg via ORAL
  Filled 2018-09-28 (×11): qty 1

## 2018-09-28 MED ORDER — ENOXAPARIN SODIUM 100 MG/ML ~~LOC~~ SOLN
85.0000 mg | Freq: Once | SUBCUTANEOUS | Status: AC
  Administered 2018-09-28: 85 mg via SUBCUTANEOUS
  Filled 2018-09-28: qty 1

## 2018-09-28 MED ORDER — VANCOMYCIN HCL 10 G IV SOLR
1250.0000 mg | INTRAVENOUS | Status: DC
  Administered 2018-09-28: 1250 mg via INTRAVENOUS
  Filled 2018-09-28 (×2): qty 1250

## 2018-09-28 NOTE — ED Notes (Signed)
Pt being transported to rm 222 at this time by this tech.

## 2018-09-28 NOTE — Consult Note (Signed)
Reason for Consult: Possible osteomyelitis left second toe. Referring Physician: Adlynn Lowenstein [name] is an 62 y.o. female.  HPI: Patient admitted today with difficulty breathing and sepsis.  On discussion with her family present in the room today she may have had previous injury to the left second toe as well as possibly recently stubbing the toe.  Unsure of any specifics.  Did not relate any open sores or drainage recently.  Past Medical History:  Diagnosis Date  . Acute myocardial infarction, subendocardial infarction, initial episode of care ([place]) 07/21/2012  . Acute osteomyelitis involving ankle and foot ([place]) 02/06/2015  . Acute systolic heart failure ([place]) 07/21/2012   New onset 07/19/12; admission to [facility] ED. Elevated Troponins.  S/p 2D-echo with EF 20-25%.  S/p cardiac catheterization with stenting LAD.  Repeat 2D-echo 10/2011 with improved EF of 35%.   . Anemia   . Automatic implantable cardioverter-defibrillator in situ    a. MDT CRT-D 06/2014, SN: [id number] H    . CAD (coronary artery disease)    a. cardiac cath 101/04/2012: PCI/DES to chronically occluded mLAD, consideration PCI to diag branch in 4 weeks.   . Cataract   . Chicken pox   . Chronic systolic CHF (congestive heart failure) (HCC)    a. mixed ICM & NICM; b. EF 20-25% by echo 07/2012, mid-dist 2/3 of LV sev HK/AK, mild MR. echo 10/2012: EF 30-35%, sev HK ant-septal & inf walls, GR1DD, mild MR, PASP 33. c. echo 02/2013: EF 30%, GR1DD, mild MR. echo 04/2014: EF 30%, Septal-lat dyssynchrony, global HK, inf AK, GR1DD, mild MR. d. echo 10/2014: EF50-55%, WM nl, GR1DD, septal mild paradox. e. echo 02/2015: EF 50-55%, wm   . Depression   . Heart attack ([place])   . Heel ulcer ([place]) 04/27/2015  . History of blood transfusion ~ 2011   "plasma; had neuropathy; couldn't walk"  . Hypertension   . LBBB (left bundle branch block)   . Neuromuscular disorder ([place])   . Neuropathy 2011  . Obesity, unspecified   . OSA on CPAP     Moderate with AHI 23/hr and now on CPAP at 16cm H2O.  Her DME is [facility]  . Pure hypercholesterolemia   . Type II diabetes mellitus ([place])   . Unspecified vitamin D deficiency     Past Surgical History:  Procedure Laterality Date  . AMPUTATION TOE Right 06/18/2015   Procedure: AMPUTATION TOE;  Surgeon: Samara Deist, DPM;  Location: [facility] ORS;  Service: Podiatry;  Laterality: Right;  . BACK SURGERY    . BI-VENTRICULAR IMPLANTABLE CARDIOVERTER DEFIBRILLATOR N/A 07/06/2014   Procedure: BI-VENTRICULAR IMPLANTABLE CARDIOVERTER DEFIBRILLATOR  (CRT-D);  Surgeon: Deboraha Sprang, MD;  Location: [facility] CATH LAB;  Service: Cardiovascular;  Laterality: N/A;  . BI-VENTRICULAR IMPLANTABLE CARDIOVERTER DEFIBRILLATOR  (CRT-D)  07/06/2014  . BILATERAL OOPHORECTOMY  01/2011   ovarian cyst benign  . COLONOSCOPY WITH PROPOFOL Left 02/22/2015   Procedure: COLONOSCOPY WITH PROPOFOL;  Surgeon: Hulen Luster, MD;  Location: [facility] ENDOSCOPY;  Service: Endoscopy;  Laterality: Left;  . CORONARY ANGIOPLASTY WITH STENT PLACEMENT Left 07/2012   new onset systolic CHF; elevated troponins.  Cardiac catheterization with stenting to LAD; EF 15%.  2D-echo: EF 20-25%.  . ESOPHAGOGASTRODUODENOSCOPY N/A 02/22/2015   Procedure: ESOPHAGOGASTRODUODENOSCOPY (EGD);  Surgeon: Hulen Luster, MD;  Location: [facility] ENDOSCOPY;  Service: Endoscopy;  Laterality: N/A;  . INCISION AND DRAINAGE ABSCESS Right 2007   groin; with ICU stay due to sepsis.  Marland Kitchen LAPAROSCOPIC CHOLECYSTECTOMY  2011  .  LEFT HEART CATHETERIZATION WITH CORONARY ANGIOGRAM N/A 07/21/2012   Procedure: LEFT HEART CATHETERIZATION WITH CORONARY ANGIOGRAM;  Surgeon: Jolaine Artist, MD;  Location: [facility] CATH LAB;  Service: Cardiovascular;  Laterality: N/A;  . [place]   L4-5  . PERCUTANEOUS CORONARY STENT INTERVENTION (PCI-S) N/A 07/23/2012   Procedure: PERCUTANEOUS CORONARY STENT INTERVENTION (PCI-S);  Surgeon: Sherren Mocha, MD;  Location: [facility] CATH LAB;  Service: Cardiovascular;   Laterality: N/A;  . PERIPHERAL VASCULAR CATHETERIZATION N/A 02/10/2015   Procedure: Picc Line Insertion;  Surgeon: Katha Cabal, MD;  Location: [facility] CV LAB;  Service: Cardiovascular;  Laterality: N/A;  . TUBAL LIGATION  1981  . VAGINAL HYSTERECTOMY  01/2011   Fibroids/DUB.  Ovaries removed. [place].    Family History  Problem Relation Age of Onset  . Diabetes Mother   . Hypertension Mother   . Arthritis Mother        knees, lumbar DDD, cervical DDD  . Cancer Father        prostate,skin,lymphoma.  . Cancer Brother 61       bladder cancer; non-smoker  . Diabetes Maternal Grandmother   . Heart disease Maternal Grandmother   . COPD Maternal Grandmother   . Diabetes Paternal Grandmother   . Obesity Brother   . Diabetes Son   . Hypertension Son   . Cancer Maternal Grandfather   . Diabetes Paternal Grandfather     Social History:  reports that she has never smoked. She has never used smokeless tobacco. She reports that she does not drink alcohol or use drugs.  Allergies: No Known Allergies  Medications:  Scheduled: . enoxaparin (LOVENOX) injection  40 mg Subcutaneous Q24H  . insulin aspart  0-15 Units Subcutaneous Q4H    Results for orders placed or performed during the hospital encounter of 09/28/18 (from the past 48 hour(s))  Urinalysis, Routine w reflex microscopic     Status: Abnormal   Collection Time: 09/28/18 12:02 PM  Result Value Ref Range   Color, Urine YELLOW (A) YELLOW   APPearance CLOUDY (A) CLEAR   Specific Gravity, Urine 1.019 1.005 - 1.030   pH 5.0 5.0 - 8.0   Glucose, UA 150 (A) NEGATIVE mg/dL   Hgb urine dipstick LARGE (A) NEGATIVE   Bilirubin Urine NEGATIVE NEGATIVE   Ketones, ur 5 (A) NEGATIVE mg/dL   Protein, ur 100 (A) NEGATIVE mg/dL   Nitrite NEGATIVE NEGATIVE   Leukocytes, UA SMALL (A) NEGATIVE   RBC / HPF 6-10 0 - 5 RBC/hpf   WBC, UA 21-50 0 - 5 WBC/hpf   Bacteria, UA RARE (A) NONE SEEN   Squamous Epithelial / LPF 0-5 0 - 5    Mucus PRESENT    Amorphous Crystal PRESENT     Comment: Performed at [facility], [address], [place], [place] [address]  Influenza panel by PCR (type A & B)     Status: None   Collection Time: 09/28/18 12:02 PM  Result Value Ref Range   Influenza A By PCR NEGATIVE NEGATIVE   Influenza B By PCR NEGATIVE NEGATIVE    Comment: (NOTE) The Xpert Xpress Flu assay is intended as an aid in the diagnosis of  influenza and should not be used as a sole basis for treatment.  This  assay is FDA approved for nasopharyngeal swab specimens only. Nasal  washings and aspirates are unacceptable for Xpert Xpress Flu testing. Performed at [facility], [address], [place], [place] [address]  CG4 I-STAT (Lactic acid)     Status: Abnormal   Collection Time: 09/28/18 12:02 PM  Result Value Ref Range   Lactic Acid, Venous 2.93 (HH) 0.5 - 1.9 mmol/L   Comment NOTIFIED PHYSICIAN   Comprehensive metabolic panel     Status: Abnormal   Collection Time: 09/28/18 12:13 PM  Result Value Ref Range   Sodium 131 (L) 135 - 145 mmol/L   Potassium 3.4 (L) 3.5 - 5.1 mmol/L   Chloride 96 (L) 98 - 111 mmol/L   CO2 22 22 - 32 mmol/L   Glucose, Bld 390 (H) 70 - 99 mg/dL   BUN 49 (H) 8 - 23 mg/dL   Creatinine, Ser 1.79 (H) 0.44 - 1.00 mg/dL   Calcium 8.2 (L) 8.9 - 10.3 mg/dL   Total Protein 6.6 6.5 - 8.1 g/dL   Albumin 3.4 (L) 3.5 - 5.0 g/dL   AST 40 15 - 41 U/L   ALT 22 0 - 44 U/L   Alkaline Phosphatase 51 38 - 126 U/L   Total Bilirubin 1.3 (H) 0.3 - 1.2 mg/dL   GFR calc non Af Amer 29 (L) >60 mL/min   GFR calc Af Amer 34 (L) >60 mL/min   Anion gap 13 5 - 15    Comment: Performed at [facility], [address] [place], [place] [address]  CBC WITH DIFFERENTIAL     Status: Abnormal   Collection Time: 09/28/18 12:13 PM  Result Value Ref Range   WBC 10.9 (H) 4.0 - 10.5 K/uL   RBC 3.92 3.87 - 5.11 MIL/uL   Hemoglobin 11.8 (L) 12.0 - 15.0 g/dL   HCT 35.2 (L) 36.0 - 46.0 %    MCV 89.8 80.0 - 100.0 fL   MCH 30.1 26.0 - 34.0 pg   MCHC 33.5 30.0 - 36.0 g/dL   RDW 12.8 11.5 - 15.5 %   Platelets 81 (L) 150 - 400 K/uL    Comment: Immature Platelet Fraction may be clinically indicated, consider ordering this additional test [id number]    nRBC 0.0 0.0 - 0.2 %   Neutrophils Relative % 78 %   Neutro Abs 8.5 (H) 1.7 - 7.7 K/uL   Lymphocytes Relative 12 %   Lymphs Abs 1.3 0.7 - 4.0 K/uL   Monocytes Relative 8 %   Monocytes Absolute 0.9 0.1 - 1.0 K/uL   Eosinophils Relative 2 %   Eosinophils Absolute 0.2 0.0 - 0.5 K/uL   Basophils Relative 0 %   Basophils Absolute 0.0 0.0 - 0.1 K/uL   RBC Morphology MORPHOLOGY UNREMARKABLE    Abs Immature Granulocytes 0.00 0.00 - 0.07 K/uL   Giant PLTs PRESENT     Comment: Performed at [facility], [address] [place], [place] [address]  Troponin I - ONCE - STAT     Status: Abnormal   Collection Time: 09/28/18 12:13 PM  Result Value Ref Range   Troponin I 0.35 (HH) <0.03 ng/mL    Comment: CRITICAL RESULT CALLED TO, READ BACK BY AND VERIFIED WITH SHANNON MARTIN ON 09/28/18 AT 1304 JJB Performed at [facility], [address] [place], [place] [address]   CG4 I-STAT (Lactic acid)     Status: Abnormal   Collection Time: 09/28/18  2:21 PM  Result Value Ref Range   Lactic Acid, Venous 2.56 (HH) 0.5 - 1.9 mmol/L   Comment NOTIFIED PHYSICIAN   Blood gas, arterial     Status: Abnormal   Collection Time: 09/28/18  3:36 PM  Result Value Ref Range   FIO2 1.00    Delivery systems BILEVEL POSITIVE AIRWAY PRESSURE    LHR 14 resp/min   Inspiratory PAP 14    Expiratory PAP 8.0    pH, Arterial 7.40 7.350 - 7.450   pCO2 arterial 30 (L) 32.0 - 48.0 mmHg   pO2, Arterial 221 (H) 83.0 - 108.0 mmHg   Bicarbonate 18.6 (L) 20.0 - 28.0 mmol/L   Acid-base deficit 5.0 (H) 0.0 - 2.0 mmol/L   O2 Saturation 99.8 %   Patient temperature 37.0    Collection site RIGHT RADIAL    Sample type ARTERIAL DRAW    Allens test  (pass/fail) POSITIVE (A) PASS    Comment: Performed at [facility], [address] [place], [place] [address]  Glucose, capillary     Status: Abnormal   Collection Time: 09/28/18  5:09 PM  Result Value Ref Range   Glucose-Capillary 381 (H) 70 - 99 mg/dL  Troponin I - Now Then Q6H     Status: Abnormal   Collection Time: 09/28/18  5:25 PM  Result Value Ref Range   Troponin I 2.89 (HH) <0.03 ng/mL    Comment: CRITICAL RESULT CALLED TO, READ BACK BY AND VERIFIED WITH SABRINA ELLIOTT AT 1807 09/28/2018.  TFK Performed at [facility], [address] [place], [place] [address]   Procalcitonin - Baseline     Status: None   Collection Time: 09/28/18  5:25 PM  Result Value Ref Range   Procalcitonin 24.21 ng/mL    Comment:        Interpretation: PCT >= 10 ng/mL: Important systemic inflammatory response, almost exclusively due to severe bacterial sepsis or septic shock. (NOTE)       Sepsis PCT Algorithm           Lower Respiratory Tract                                      Infection PCT Algorithm    ----------------------------     ----------------------------         PCT < 0.25 ng/mL                PCT < 0.10 ng/mL         Strongly encourage             Strongly discourage   discontinuation of antibiotics    initiation of antibiotics    ----------------------------     -----------------------------       PCT 0.25 - 0.50 ng/mL            PCT 0.10 - 0.25 ng/mL               OR       >80% decrease in PCT            Discourage initiation of                                            antibiotics      Encourage discontinuation           of antibiotics    ----------------------------     -----------------------------         PCT >= 0.50 ng/mL  PCT 0.26 - 0.50 ng/mL                AND       <80% decrease in PCT             Encourage initiation of                                             antibiotics       Encourage continuation           of  antibiotics    ----------------------------     -----------------------------        PCT >= 0.50 ng/mL                  PCT > 0.50 ng/mL               AND         increase in PCT                  Strongly encourage                                      initiation of antibiotics    Strongly encourage escalation           of antibiotics                                     -----------------------------                                           PCT <= 0.25 ng/mL                                                 OR                                        > 80% decrease in PCT                                     Discontinue / Do not initiate                                             antibiotics Performed at [facility], [address] [place], [place] [address]     Dg Chest Port 1 View  Result Date: 09/28/2018 CLINICAL DATA:  Pt with altered mental status and elevated temp emesis x 1 day Pt not able to get OOB the last few days Infection to left second toe noted. Hx of diabetes, hypertension, heart attack, CHF, CAD, defibrillator. Non smokerams. 2nd toe swollen EXAM: PORTABLE CHEST 1 VIEW COMPARISON:  02/24/2018 FINDINGS: LEFT-sided pacemaker overlies  normal cardiac silhouette. Low lung volumes. No effusion, infiltrate pneumothorax. No acute osseous abnormality. IMPRESSION: Low lung volumes.  No acute findings Electronically Signed   By: Suzy Bouchard M.D.   On: 09/28/2018 12:57   Dg Foot Complete Left  Result Date: 09/28/2018 CLINICAL DATA:  Second toe swelling.  Altered mental status. EXAM: LEFT FOOT - COMPLETE 3+ VIEW COMPARISON:  05/03/2016. FINDINGS: There is diffuse soft tissue swelling involving the forefoot. Periarticular bony resorption at the second PIP joint is identified and may be slightly progressive when compared with 05/03/2016. No acute fractures or dislocations identified. Moderate degenerative changes within the midfoot identified. Plantar heel spur noted.  IMPRESSION: 1. Soft tissue swelling and periarticular bony resorption identified at the second PIP joint. Findings are compatible with either inflammatory or infectious arthropathy. Electronically Signed   By: Kerby Moors M.D.   On: 09/28/2018 13:03    Review of Systems  Unable to perform ROS: Medical condition   Blood pressure (!) 79/48, pulse (!) 101, temperature 100.2 F (37.9 C), temperature source Axillary, resp. rate (!) 28, height 5\' 7"  (1.702 m), weight 124.9 kg, SpO2 95 %. Physical Exam  Cardiovascular:  DP and PT pulses are diminished but palpable.  Musculoskeletal:     Comments: Adequate range of motion of the pedal joints.  Digital contracture fairly rigid in the left second toe.  No gross instability on motion.  Muscle testing is deferred.  Previous amputation of the right second toe.  Neurological:  History of some neuropathy but difficult to assess today due to her condition.  Skin:  The skin is warm dry and supple.  Some edema and ecchymosis is noted in the left second toe and more mildly around the base of the third toe.  No palpable fluid collections or open lesions are noted.  No significant erythema or cellulitis.    Assessment/Plan: Assessment: [place] with evidence for previous injury.  No clear evidence of osteomyelitis Diabetes with neuropathy.  Plan: At this point I do not think that the second toe is infected or the source for her sepsis.  X-rays today as well as previously taken in 2017 show evidence for previous injury with degenerative changes.  Appears to be more of a contusion than osteomyelitis at this point.  For now would recommend following this over the next few days but again I do not think that this is the likely source of her sepsis.  Durward Fortes 09/28/2018, 6:30 PM

## 2018-09-28 NOTE — ED Notes (Signed)
Dr Rosita Fire in with pt and family  now

## 2018-09-28 NOTE — Progress Notes (Signed)
Pharmacy Antibiotic Note  [name] is a 62 y.o. female admitted on 09/28/2018 with sepsis.  Pharmacy has been consulted for vancomycin and cefepime dosing. Patient is also on metronidazole.  Plan: Vancomycin 1250 mg IV q18h with first dose today at 1830. Goal trough 15-20 mcg/ml. Trough ordered 12/25 at 2330.  ke 0.044, t1/2 15.75 hr, VD 66.15 L  Cefepime 2 g IV q12h  Height: 5\' 11"  (180.3 cm) Weight: 286 lb 9.6 oz (130 kg) IBW/kg (Calculated) : 70.8  Temp (24hrs), Avg:101.4 F (38.6 C), Min:101.4 F (38.6 C), Max:101.4 F (38.6 C)  Recent Labs  Lab 09/28/18 1202 09/28/18 1213  WBC  --  10.9*  CREATININE  --  1.79*  LATICACIDVEN 2.93*  --     Estimated Creatinine Clearance: 47.4 mL/min (A) (by C-G formula based on SCr of 1.79 mg/dL (H)).    No Known Allergies  Antimicrobials this admission: Vancomycin 12/23 >>  Cefepime 12/23 >>  Metronidazole 12/23 >>  Dose adjustments this admission: NA  Microbiology results: 12/23 BCx: pending 12/23 UCx: pending    Thank you for allowing pharmacy to be a part of this patient's care.  Tawnya Crook, PharmD Pharmacy Resident  09/28/2018 1:20 PM

## 2018-09-28 NOTE — ED Provider Notes (Addendum)
[facility] Emergency Department Provider Note  ____________________________________________   First MD Initiated Contact with Patient 09/28/18 1152     (approximate)  I have reviewed the triage vital signs and the nursing notes.   HISTORY  Chief Complaint Code Sepsis   HPI [name] is a 62 y.o. female with history of MI, diabetes, pacemaker defibrillator as well as CAD who is presenting with 24 hours of fever, nausea vomiting and diarrhea as well as altered mental status.  Patient coming from home.  EMS with a axillary temperature of 102 in route.  Patient does not report any pain at this time.   Past Medical History:  Diagnosis Date  . Acute myocardial infarction, subendocardial infarction, initial episode of care ([place]) 07/21/2012  . Acute osteomyelitis involving ankle and foot ([place]) 02/06/2015  . Acute systolic heart failure ([place]) 07/21/2012   New onset 07/19/12; admission to [facility] ED. Elevated Troponins.  S/p 2D-echo with EF 20-25%.  S/p cardiac catheterization with stenting LAD.  Repeat 2D-echo 10/2011 with improved EF of 35%.   . Anemia   . Automatic implantable cardioverter-defibrillator in situ    a. MDT CRT-D 06/2014, SN: [id number]    . CAD (coronary artery disease)    a. cardiac cath 101/04/2012: PCI/DES to chronically occluded mLAD, consideration PCI to diag branch in 4 weeks.   . Cataract   . Chicken pox   . Chronic systolic CHF (congestive heart failure) (HCC)    a. mixed ICM & NICM; b. EF 20-25% by echo 07/2012, mid-dist 2/3 of LV sev HK/AK, mild MR. echo 10/2012: EF 30-35%, sev HK ant-septal & inf walls, GR1DD, mild MR, PASP 33. c. echo 02/2013: EF 30%, GR1DD, mild MR. echo 04/2014: EF 30%, Septal-lat dyssynchrony, global HK, inf AK, GR1DD, mild MR. d. echo 10/2014: EF50-55%, WM nl, GR1DD, septal mild paradox. e. echo 02/2015: EF 50-55%, wm   . Depression   . Heart attack ([place])   . Heel ulcer ([place]) 04/27/2015  . History of blood  transfusion ~ 2011   "plasma; had neuropathy; couldn't walk"  . Hypertension   . LBBB (left bundle branch block)   . Neuromuscular disorder ([place])   . Neuropathy 2011  . Obesity, unspecified   . OSA on CPAP    Moderate with AHI 23/hr and now on CPAP at 16cm H2O.  Her DME is [facility]  . Pure hypercholesterolemia   . Type II diabetes mellitus ([place])   . Unspecified vitamin D deficiency     Patient Active Problem List   Diagnosis Date Noted  . Sepsis ([place]) 09/28/2018  . History of amputation of lesser toe of right foot ([place]) 04/19/2018  . 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 Paraparesis of both lower limbs ([place]) 03/29/2018  . 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 Paroxysmal atrial fibrillation ([place]) 03/29/2018  . Class 3 obesity due to excess calories with serious comorbidity and body mass index (BMI) of 40.0 to 44.9 in adult 09/02/2016  . History of CVA (cerebrovascular accident) 11/11/2015  . Chronic renal insufficiency 08/04/2015  . OSA (obstructive sleep apnea) 08/15/2014  . Morbid obesity ([place]) 10/26/2013  . Diarrhea, unspecified 04/29/2013  . Essential hypertension, benign 04/29/2013  . Pure hypercholesterolemia 12/07/2012  . Iron deficiency anemia 12/07/2012  . Diabetic peripheral neuropathy associated with type 2 diabetes mellitus ([place]) 12/07/2012  . Chronic systolic congestive heart failure ([place]) 07/29/2012  . Left bundle-branch block 07/25/2012  . Atherosclerotic heart disease of native coronary artery without  angina pectoris 07/25/2012    Past Surgical History:  Procedure Laterality Date  . AMPUTATION TOE Right 06/18/2015   Procedure: AMPUTATION TOE;  Surgeon: Samara Deist, DPM;  Location: [facility] ORS;  Service: Podiatry;  Laterality: Right;  . BACK SURGERY    . BI-VENTRICULAR IMPLANTABLE CARDIOVERTER DEFIBRILLATOR N/A 07/06/2014   Procedure: BI-VENTRICULAR IMPLANTABLE CARDIOVERTER DEFIBRILLATOR  (CRT-D);  Surgeon: Deboraha Sprang, MD;   Location: [facility] CATH LAB;  Service: Cardiovascular;  Laterality: N/A;  . BI-VENTRICULAR IMPLANTABLE CARDIOVERTER DEFIBRILLATOR  (CRT-D)  07/06/2014  . BILATERAL OOPHORECTOMY  01/2011   ovarian cyst benign  . COLONOSCOPY WITH PROPOFOL Left 02/22/2015   Procedure: COLONOSCOPY WITH PROPOFOL;  Surgeon: Hulen Luster, MD;  Location: [facility] ENDOSCOPY;  Service: Endoscopy;  Laterality: Left;  . CORONARY ANGIOPLASTY WITH STENT PLACEMENT Left 07/2012   new onset systolic CHF; elevated troponins.  Cardiac catheterization with stenting to LAD; EF 15%.  2D-echo: EF 20-25%.  . ESOPHAGOGASTRODUODENOSCOPY N/A 02/22/2015   Procedure: ESOPHAGOGASTRODUODENOSCOPY (EGD);  Surgeon: Hulen Luster, MD;  Location: [facility] ENDOSCOPY;  Service: Endoscopy;  Laterality: N/A;  . INCISION AND DRAINAGE ABSCESS Right 2007   groin; with ICU stay due to sepsis.  Marland Kitchen LAPAROSCOPIC CHOLECYSTECTOMY  2011  . LEFT HEART CATHETERIZATION WITH CORONARY ANGIOGRAM N/A 07/21/2012   Procedure: LEFT HEART CATHETERIZATION WITH CORONARY ANGIOGRAM;  Surgeon: Jolaine Artist, MD;  Location: [facility] CATH LAB;  Service: Cardiovascular;  Laterality: N/A;  . [place]   L4-5  . PERCUTANEOUS CORONARY STENT INTERVENTION (PCI-S) N/A 07/23/2012   Procedure: PERCUTANEOUS CORONARY STENT INTERVENTION (PCI-S);  Surgeon: Sherren Mocha, MD;  Location: [facility] CATH LAB;  Service: Cardiovascular;  Laterality: N/A;  . PERIPHERAL VASCULAR CATHETERIZATION N/A 02/10/2015   Procedure: Picc Line Insertion;  Surgeon: Katha Cabal, MD;  Location: [place] CV LAB;  Service: Cardiovascular;  Laterality: N/A;  . TUBAL LIGATION  1981  . VAGINAL HYSTERECTOMY  01/2011   Fibroids/DUB.  Ovaries removed. [place].    Prior to Admission medications   Medication Sig Start Date End Date Taking? Authorizing Provider  AMBULATORY NON FORMULARY MEDICATION 1 Units by Other route once. Medication Name: foot brace 11/15/15  Yes Patel, Donika K, DO  Calcium Carb-Cholecalciferol (CALCIUM  600 + D PO) Take 1 tablet by mouth 2 (two) times daily.   Yes [provider]  citalopram (CELEXA) 20 MG tablet Take 1 tablet (20 mg total) by mouth daily. 04/15/18  Yes Wardell Honour, MD  clopidogrel (PLAVIX) 75 MG tablet Take 1 tablet (75 mg total) by mouth daily. Patient taking differently: Take 75 mg by mouth at bedtime.  04/15/18  Yes Wardell Honour, MD  ferrous sulfate 325 (65 FE) MG tablet Take 325 mg by mouth daily.   Yes [provider]  glimepiride (AMARYL) 4 MG tablet Take 1 tablet (4 mg total) by mouth 2 (two) times daily with a meal. 04/15/18  Yes Wardell Honour, MD  glucose blood (ONE TOUCH ULTRA TEST) test strip Check sugar three times daily dx: DMII uncontrolled insulin dependent with complications 9/38/10  Yes Wardell Honour, MD  Insulin Glargine (LANTUS) 100 UNIT/ML Solostar Pen Inject 30 Units into the skin at bedtime. 04/15/18  Yes Wardell Honour, MD  Insulin Pen Needle (B-D ULTRAFINE III SHORT PEN) 31G X 8 MM MISC USE AS DIRECTED WITH LANTUS; dx: DMII uncontrolled, insulin dependent, with complications 1/75/10  Yes Wardell Honour, MD  losartan (COZAAR) 25 MG tablet Take 25 mg by mouth  at bedtime.   Yes [provider]  omega-3 acid ethyl esters (LOVAZA) 1 g capsule Take 1 g by mouth 2 (two) times daily.   Yes [provider]  torsemide (DEMADEX) 20 MG tablet Take 1 tablet (20 mg total) by mouth daily. Patient taking differently: Take 20 mg by mouth at bedtime.  04/15/18  Yes Wardell Honour, MD  traMADol (ULTRAM) 50 MG tablet Take 1 tablet (50 mg total) by mouth every 6 (six) hours as needed. for pain Patient taking differently: Take 50 mg by mouth every 6 (six) hours as needed for moderate pain.  04/15/18  Yes Wardell Honour, MD  Dulaglutide (TRULICITY) 9.76 BH/4.1PF SOPN Inject 0.75 mg into the skin once a week. Patient not taking: Reported on 09/28/2018 04/15/18   Wardell Honour, MD  gabapentin (NEURONTIN) 600 MG tablet Take 1 tablet (600  mg total) by mouth 3 (three) times daily. Patient not taking: Reported on 09/28/2018 04/15/18   Wardell Honour, MD  rosuvastatin (CRESTOR) 40 MG tablet TAKE 1 TABLET BY MOUTH ONCE DAILY Patient not taking: Reported on 09/28/2018 12/16/17   Minna Merritts, MD    Allergies Patient has no known allergies.  Family History  Problem Relation Age of Onset  . Diabetes Mother   . Hypertension Mother   . Arthritis Mother        knees, lumbar DDD, cervical DDD  . Cancer Father        prostate,skin,lymphoma.  . Cancer Brother 59       bladder cancer; non-smoker  . Diabetes Maternal Grandmother   . Heart disease Maternal Grandmother   . COPD Maternal Grandmother   . Diabetes Paternal Grandmother   . Obesity Brother   . Diabetes Son   . Hypertension Son   . Cancer Maternal Grandfather   . Diabetes Paternal Grandfather     Social History Social History   Tobacco Use  . Smoking status: Never Smoker  . Smokeless tobacco: Never Used  Substance Use Topics  . Alcohol use: No    Alcohol/week: 0.0 standard drinks  . Drug use: No    Review of Systems  Level 5 caveat secondary to altered mental status.   ____________________________________________   PHYSICAL EXAM:  VITAL SIGNS: ED Triage Vitals  Enc Vitals Group     BP 09/28/18 1200 (!) 172/57     Pulse Rate 09/28/18 1157 (!) 109     Resp 09/28/18 1157 19     Temp 09/28/18 1217 (!) 101.4 F (38.6 C)     Temp Source 09/28/18 1217 Rectal     SpO2 09/28/18 1157 97 %     Weight 09/28/18 1158 286 lb 9.6 oz (130 kg)     Height 09/28/18 1158 5\' 11"  (1.803 m)     Head Circumference --      Peak Flow --      Pain Score 09/28/18 1158 0     Pain Loc --      Pain Edu? --      Excl. in [place]? --     Constitutional: Alert and and awake.  However, ANO x0.  Patient is very confused and repeats "yes," when asked any questions.  However, she did deny pain. Eyes: Conjunctivae are normal.  Head: Atraumatic. Nose: No  congestion/rhinnorhea. Mouth/Throat: Mucous membranes are moist.  Neck: No stridor.   Cardiovascular: Tachycardic, regular rhythm. Grossly normal heart sounds.   Respiratory: Normal respiratory effort.  No retractions. Lungs CTAB. Gastrointestinal: Soft  and nontender. No distention.  Musculoskeletal:   Left second toe with the nail missing and dried blood over the nailbed.  Toe is swollen but without fluctuance.  Ecchymotic.  No crepitus.  Neurologic:  Normal speech and language. No gross focal neurologic deficits are appreciated. Skin:  Skin is warm, dry and intact. No rash noted.   ____________________________________________   LABS (all labs ordered are listed, but only abnormal results are displayed)  Labs Reviewed  COMPREHENSIVE METABOLIC PANEL - Abnormal; Notable for the following components:      Result Value   Sodium 131 (*)    Potassium 3.4 (*)    Chloride 96 (*)    Glucose, Bld 390 (*)    BUN 49 (*)    Creatinine, Ser 1.79 (*)    Calcium 8.2 (*)    Albumin 3.4 (*)    Total Bilirubin 1.3 (*)    GFR calc non Af Amer 29 (*)    GFR calc Af Amer 34 (*)    All other components within normal limits  CBC WITH DIFFERENTIAL/PLATELET - Abnormal; Notable for the following components:   WBC 10.9 (*)    Hemoglobin 11.8 (*)    HCT 35.2 (*)    Platelets 81 (*)    Neutro Abs 8.5 (*)    All other components within normal limits  URINALYSIS, ROUTINE W REFLEX MICROSCOPIC - Abnormal; Notable for the following components:   Color, Urine YELLOW (*)    APPearance CLOUDY (*)    Glucose, UA 150 (*)    Hgb urine dipstick LARGE (*)    Ketones, ur 5 (*)    Protein, ur 100 (*)    Leukocytes, UA SMALL (*)    Bacteria, UA RARE (*)    All other components within normal limits  TROPONIN I - Abnormal; Notable for the following components:   Troponin I 0.35 (*)    All other components within normal limits  CG4 I-STAT (LACTIC ACID) - Abnormal; Notable for the following components:    Lactic Acid, Venous 2.93 (*)    All other components within normal limits  CULTURE, BLOOD (ROUTINE X 2)  CULTURE, BLOOD (ROUTINE X 2)  URINE CULTURE  INFLUENZA PANEL BY PCR (TYPE A & B)  I-STAT CG4 LACTIC ACID, ED  I-STAT CG4 LACTIC ACID, ED   ____________________________________________  EKG  ED ECG REPORT I, Doran Stabler, the attending physician, personally viewed and interpreted this ECG.   Date: 09/28/2018  EKG Time: 1156  Rate: 108  Rhythm: Ventricular paced rhythm  Axis: Normal  Intervals:Wide-complex secondary to ventricular pacing.  ST&T Change: No ST segment elevation.  Mild ST depression in 1 to 2 mm in 1 and aVL.  T wave inversions in 1 as well as aVL.   ____________________________________________  RADIOLOGY  Chest x-ray with low lung volumes without acute finding.  Left foot soft tissue swelling and periarticular bony resorption identified at the second PIP joint.  Findings compatible with either inflammatory or infectious arthropathy. ____________________________________________   PROCEDURES  Procedure(s) performed:   .Critical Care Performed by: Orbie Pyo, MD Authorized by: Orbie Pyo, MD   Critical care provider statement:    Critical care time (minutes):  35   Critical care time was exclusive of:  Separately billable procedures and treating other patients   Critical care was necessary to treat or prevent imminent or life-threatening deterioration of the following conditions:  Sepsis   Critical care was time spent personally by me on the  following activities:  Development of treatment plan with patient or surrogate, discussions with consultants, evaluation of patient's response to treatment, examination of patient, obtaining history from patient or surrogate, ordering and performing treatments and interventions, ordering and review of laboratory studies, ordering and review of radiographic studies, pulse oximetry,  re-evaluation of patient's condition and review of old charts    Critical Care performed:    ____________________________________________   INITIAL IMPRESSION / [place] / ED COURSE  Pertinent labs & imaging results that were available during my care of the patient were reviewed by me and considered in my medical decision making (see chart for details).  DDX: UTI, influenza, delirium, electrolyte abnormality, sepsis, pneumonia, nausea and vomiting As part of my medical decision making, I reviewed the following data within the [place] Notes from prior ED visits  ----------------------------------------- 1:53 PM on 09/28/2018 -----------------------------------------  Patient's daughter now at the bedside.  Says the patient has had nausea vomiting over the past 24 hours.  Also with altered mental status.  Daughter says that the patient had a similar episode of altered mentation when she needed to have a right second toe amputated secondary to infection from a "rare neuropathy."  This is the patient's only obvious source.  She continues to deny any pain.  Ranging her head from side to side without issue.  No meningismus.  Patient received fluids.  Will give Zofran as well.  Ordered Ativan because patient slightly agitated and picking at the monitor leads.  Likely elevated troponin secondary to stress from sepsis.  Will be admitted to the hospital.  Signed out to Dr. Margaretmary Eddy.   ____________________________________________   FINAL CLINICAL IMPRESSION(S) / ED DIAGNOSES  Sepsis.  Cellulitis.  Septic arthritis of the left second toe.  NEW MEDICATIONS STARTED DURING THIS VISIT:  New Prescriptions   No medications on file     Note:  This document was prepared using Dragon voice recognition software and may include unintentional dictation errors.     Orbie Pyo, MD 09/28/18 1355    Leopold Smyers, Randall An, MD 09/28/18 [phone]

## 2018-09-28 NOTE — Progress Notes (Signed)
PHARMACY - PHYSICIAN COMMUNICATION CRITICAL VALUE ALERT - BLOOD CULTURE IDENTIFICATION (BCID)  Results for orders placed or performed during the hospital encounter of 09/28/18  Blood Culture ID Panel (Reflexed) (Collected: 09/28/2018 12:02 PM)  Result Value Ref Range   Enterococcus species NOT DETECTED NOT DETECTED   Listeria monocytogenes NOT DETECTED NOT DETECTED   Staphylococcus species DETECTED (A) NOT DETECTED   Staphylococcus aureus (BCID) DETECTED (A) NOT DETECTED   Methicillin resistance NOT DETECTED NOT DETECTED   Streptococcus species NOT DETECTED NOT DETECTED   Streptococcus agalactiae NOT DETECTED NOT DETECTED   Streptococcus pneumoniae NOT DETECTED NOT DETECTED   Streptococcus pyogenes NOT DETECTED NOT DETECTED   Acinetobacter baumannii NOT DETECTED NOT DETECTED   Enterobacteriaceae species NOT DETECTED NOT DETECTED   Enterobacter cloacae complex NOT DETECTED NOT DETECTED   Escherichia coli NOT DETECTED NOT DETECTED   Klebsiella oxytoca NOT DETECTED NOT DETECTED   Klebsiella pneumoniae NOT DETECTED NOT DETECTED   Proteus species NOT DETECTED NOT DETECTED   Serratia marcescens NOT DETECTED NOT DETECTED   Haemophilus influenzae NOT DETECTED NOT DETECTED   Neisseria meningitidis NOT DETECTED NOT DETECTED   Pseudomonas aeruginosa NOT DETECTED NOT DETECTED   Candida albicans NOT DETECTED NOT DETECTED   Candida glabrata NOT DETECTED NOT DETECTED   Candida krusei NOT DETECTED NOT DETECTED   Candida parapsilosis NOT DETECTED NOT DETECTED   Candida tropicalis NOT DETECTED NOT DETECTED    Name of physician (or Provider) Contacted:  Darel Hong, NP   Changes to prescribed antibiotics required: No, will continue current regimen.   Ante Arredondo D 09/28/2018  10:45 PM

## 2018-09-28 NOTE — ED Notes (Signed)
Pt O2 sats noted to dropped to 88% on RA after dose of ativan, pt placed on 2L [place]. Now 95%.Marland Kitchen

## 2018-09-28 NOTE — ED Notes (Signed)
Report called to sabrina rn ccu nurse

## 2018-09-28 NOTE — ED Notes (Signed)
Ice packs applied to pt to assist with cooling the pt.

## 2018-09-28 NOTE — Progress Notes (Signed)
CODE SEPSIS - PHARMACY COMMUNICATION  **Broad Spectrum Antibiotics should be administered within 1 hour of Sepsis diagnosis**  Time Code Sepsis Called/Page Received: 1200  Antibiotics Ordered: vancomycin/cefepime/metronidazole  Time of 1st antibiotic administration: 1231  Additional action taken by pharmacy: NA  If necessary, Name of Provider/Nurse Contacted: NA  Tawnya Crook, PharmD Pharmacy Resident  09/28/2018 12:34 PM

## 2018-09-28 NOTE — Progress Notes (Signed)
Family Meeting Note  Advance Directive:yes  Today a meeting took place with the Patient, patient's daughter at bedside  Patient is unable to participate due DG:LOVFIE capacity Altered   The following clinical team members were present during this meeting:MD  The following were discussed:Patient's diagnosis: Altered mental status from metabolic encephalopathy, sepsis, obesity, elevated troponin, diabetes mellitus chronic systolic congestive heart failure, hypertension and other medical problems, treatment plan of care discussed in detail with the patient's daughter at bedside.  She verbalized understanding of the plan.     Patient's progosis: Unable to determine and Goals for treatment: Full Code Daughter Altha Harm is a healthcare POA  Additional follow-up to be provided: Hospitalist, podiatry  Time spent during discussion:17 MIN  Nicholes Mango, MD

## 2018-09-28 NOTE — Progress Notes (Signed)
ANTICOAGULATION CONSULT NOTE - Initial Consult  Pharmacy Consult for Heparin  Indication: chest pain/ACS  No Known Allergies  Patient Measurements: Height: 5\' 7"  (170.2 cm) Weight: 275 lb 5.7 oz (124.9 kg) IBW/kg (Calculated) : 61.6 Heparin Dosing Weight:  101 kg   Vital Signs: Temp: 98 F (36.7 C) (12/23 1930) Temp Source: Axillary (12/23 1930) BP: 94/53 (12/23 1930) Pulse Rate: 108 (12/23 1930)  Labs: Recent Labs    09/28/18 1213 09/28/18 1725  HGB 11.8*  --   HCT 35.2*  --   PLT 81*  --   CREATININE 1.79*  --   TROPONINI 0.35* 2.89*    Estimated Creatinine Clearance: 43.6 mL/min (A) (by C-G formula based on SCr of 1.79 mg/dL (H)).   Medical History: Past Medical History:  Diagnosis Date  . Acute myocardial infarction, subendocardial infarction, initial episode of care ([place]) 07/21/2012  . Acute osteomyelitis involving ankle and foot ([place]) 02/06/2015  . Acute systolic heart failure ([place]) 07/21/2012   New onset 07/19/12; admission to [facility] ED. Elevated Troponins.  S/p 2D-echo with EF 20-25%.  S/p cardiac catheterization with stenting LAD.  Repeat 2D-echo 10/2011 with improved EF of 35%.   . Anemia   . Automatic implantable cardioverter-defibrillator in situ    a. MDT CRT-D 06/2014, SN: [id number]    . CAD (coronary artery disease)    a. cardiac cath 101/04/2012: PCI/DES to chronically occluded mLAD, consideration PCI to diag branch in 4 weeks.   . Cataract   . Chicken pox   . Chronic systolic CHF (congestive heart failure) (HCC)    a. mixed ICM & NICM; b. EF 20-25% by echo 07/2012, mid-dist 2/3 of LV sev HK/AK, mild MR. echo 10/2012: EF 30-35%, sev HK ant-septal & inf walls, GR1DD, mild MR, PASP 33. c. echo 02/2013: EF 30%, GR1DD, mild MR. echo 04/2014: EF 30%, Septal-lat dyssynchrony, global HK, inf AK, GR1DD, mild MR. d. echo 10/2014: EF50-55%, WM nl, GR1DD, septal mild paradox. e. echo 02/2015: EF 50-55%, wm   . Depression   . Heart attack ([place])   . Heel ulcer ([place])  04/27/2015  . History of blood transfusion ~ 2011   "plasma; had neuropathy; couldn't walk"  . Hypertension   . LBBB (left bundle branch block)   . Neuromuscular disorder ([place])   . Neuropathy 2011  . Obesity, unspecified   . OSA on CPAP    Moderate with AHI 23/hr and now on CPAP at 16cm H2O.  Her DME is [facility]  . Pure hypercholesterolemia   . Type II diabetes mellitus ([place])   . Unspecified vitamin D deficiency     Medications:  Medications Prior to Admission  Medication Sig Dispense Refill Last Dose  . AMBULATORY NON FORMULARY MEDICATION 1 Units by Other route once. Medication Name: foot brace 1 Units 0 Taking  . Calcium Carb-Cholecalciferol (CALCIUM 600 + D PO) Take 1 tablet by mouth 2 (two) times daily.   09/28/2018 at am  . citalopram (CELEXA) 20 MG tablet Take 1 tablet (20 mg total) by mouth daily. 90 tablet 1 09/28/2018 at am  . clopidogrel (PLAVIX) 75 MG tablet Take 1 tablet (75 mg total) by mouth daily. (Patient taking differently: Take 75 mg by mouth at bedtime. ) 90 tablet 3 09/26/2018 at pm  . ferrous sulfate 325 (65 FE) MG tablet Take 325 mg by mouth daily.   09/28/2018 at am  . glimepiride (AMARYL) 4 MG tablet Take 1 tablet (4 mg total) by mouth 2 (two) times  daily with a meal. 180 tablet 3 09/28/2018 at am  . glucose blood (ONE TOUCH ULTRA TEST) test strip Check sugar three times daily dx: DMII uncontrolled insulin dependent with complications 511 each 11 Taking  . Insulin Glargine (LANTUS) 100 UNIT/ML Solostar Pen Inject 30 Units into the skin at bedtime. 15 mL 11 09/26/2018 at pm  . Insulin Pen Needle (B-D ULTRAFINE III SHORT PEN) 31G X 8 MM MISC USE AS DIRECTED WITH LANTUS; dx: DMII uncontrolled, insulin dependent, with complications 021 each 11 Taking  . losartan (COZAAR) 25 MG tablet Take 25 mg by mouth at bedtime.   09/26/2018 at pm  . omega-3 acid ethyl esters (LOVAZA) 1 g capsule Take 1 g by mouth 2 (two) times daily.   09/28/2018 at am  . torsemide (DEMADEX) 20 MG  tablet Take 1 tablet (20 mg total) by mouth daily. (Patient taking differently: Take 20 mg by mouth at bedtime. ) 90 tablet 1 09/26/2018 at pm  . traMADol (ULTRAM) 50 MG tablet Take 1 tablet (50 mg total) by mouth every 6 (six) hours as needed. for pain (Patient taking differently: Take 50 mg by mouth every 6 (six) hours as needed for moderate pain. ) 120 tablet 5 unknown at unknown  . Dulaglutide (TRULICITY) 1.17 BV/6.7OL SOPN Inject 0.75 mg into the skin once a week. (Patient not taking: Reported on 09/28/2018) 2 mL 5 Not Taking at Unknown time  . gabapentin (NEURONTIN) 600 MG tablet Take 1 tablet (600 mg total) by mouth 3 (three) times daily. (Patient not taking: Reported on 09/28/2018) 270 tablet 1 Not Taking at Unknown time  . rosuvastatin (CRESTOR) 40 MG tablet TAKE 1 TABLET BY MOUTH ONCE DAILY (Patient not taking: Reported on 09/28/2018) 90 tablet 3 Not Taking at Unknown time    Assessment: Pharmacy consulted to dose heparin in this 62 year old female with ACS/NSTEMI.  Pt received lovenox 40 mg SQ on 12/23 @ 20:00. CrCl = 43.6 m/min  Goal of Therapy:  Heparin level 0.3-0.7 units/ml Monitor platelets by anticoagulation protocol: Yes   Plan:  Will order lovenox 85 mg SQ X 1 to make total lovenox dose of 125 mg (1mg /kg) for 12/23 @ ~ 20:00. Will start heparin gtt @ 1400 units/hr on 12/24 @ 0800.  Will draw 1st HL 6 hrs after start of drip on 12/24 @ 1400.  Will check HL and CBC daily.   [name] D 09/28/2018,8:31 PM

## 2018-09-28 NOTE — H&P (Signed)
[place] [facility] [place] NAME: [name]    MR#:  [id number]  DATE OF BIRTH:  01/22/1956  DATE OF ADMISSION:  09/28/2018  PRIMARY CARE PHYSICIAN: Rutherford Guys, MD   REQUESTING/REFERRING PHYSICIAN: Orbie Pyo, MD  CHIEF COMPLAINT:   Sepsis HISTORY OF PRESENT ILLNESS:  [name]  is a 62 y.o. female with a known history of coronary artery disease, diabetes mellitus, status post pacemaker defibrillation and multiple other medical problems is brought into the ED with altered mental status.  Patient has fever associated with nausea vomiting and diarrhea for the past 24 hours according to the daughter at bedside.  Temperature was 102 F and white blood cell count is elevated and the troponins are elevated History is obtained from the ER doctor, staff records and patient's daughter at bedside.  PAST MEDICAL HISTORY:   Past Medical History:  Diagnosis Date  . Acute myocardial infarction, subendocardial infarction, initial episode of care ([place]) 07/21/2012  . Acute osteomyelitis involving ankle and foot ([place]) 02/06/2015  . Acute systolic heart failure ([place]) 07/21/2012   New onset 07/19/12; admission to [facility] ED. Elevated Troponins.  S/p 2D-echo with EF 20-25%.  S/p cardiac catheterization with stenting LAD.  Repeat 2D-echo 10/2011 with improved EF of 35%.   . Anemia   . Automatic implantable cardioverter-defibrillator in situ    a. MDT CRT-D 06/2014, SN: [id number]    . CAD (coronary artery disease)    a. cardiac cath 101/04/2012: PCI/DES to chronically occluded mLAD, consideration PCI to diag branch in 4 weeks.   . Cataract   . Chicken pox   . Chronic systolic CHF (congestive heart failure) (HCC)    a. mixed ICM & NICM; b. EF 20-25% by echo 07/2012, mid-dist 2/3 of LV sev HK/AK, mild MR. echo 10/2012: EF 30-35%, sev HK ant-septal & inf walls, GR1DD, mild MR, PASP 33. c. echo 02/2013: EF 30%, GR1DD, mild MR. echo  04/2014: EF 30%, Septal-lat dyssynchrony, global HK, inf AK, GR1DD, mild MR. d. echo 10/2014: EF50-55%, WM nl, GR1DD, septal mild paradox. e. echo 02/2015: EF 50-55%, wm   . Depression   . Heart attack ([place])   . Heel ulcer ([place]) 04/27/2015  . History of blood transfusion ~ 2011   "plasma; had neuropathy; couldn't walk"  . Hypertension   . LBBB (left bundle branch block)   . Neuromuscular disorder ([place])   . Neuropathy 2011  . Obesity, unspecified   . OSA on CPAP    Moderate with AHI 23/hr and now on CPAP at 16cm H2O.  Her DME is [facility]  . Pure hypercholesterolemia   . Type II diabetes mellitus ([place])   . Unspecified vitamin D deficiency     PAST SURGICAL HISTOIRY:   Past Surgical History:  Procedure Laterality Date  . AMPUTATION TOE Right 06/18/2015   Procedure: AMPUTATION TOE;  Surgeon: Samara Deist, DPM;  Location: [facility] ORS;  Service: Podiatry;  Laterality: Right;  . BACK SURGERY    . BI-VENTRICULAR IMPLANTABLE CARDIOVERTER DEFIBRILLATOR N/A 07/06/2014   Procedure: BI-VENTRICULAR IMPLANTABLE CARDIOVERTER DEFIBRILLATOR  (CRT-D);  Surgeon: Deboraha Sprang, MD;  Location[facility] CATH LAB;  Service: Cardiovascular;  Laterality: N/A;  . BI-VENTRICULAR IMPLANTABLE CARDIOVERTER DEFIBRILLATOR  (CRT-D)  07/06/2014  . BILATERAL OOPHORECTOMY  01/2011   ovarian cyst benign  . COLONOSCOPY WITH PROPOFOL Left 02/22/2015   Procedure: COLONOSCOPY WITH PROPOFOL;  Surgeon: Hulen Luster, MD;  Location: [facility] ENDOSCOPY;  Service: Endoscopy;  Laterality: Left;  . CORONARY ANGIOPLASTY WITH STENT PLACEMENT Left 07/2012   new onset systolic CHF; elevated troponins.  Cardiac catheterization with stenting to LAD; EF 15%.  2D-echo: EF 20-25%.  . ESOPHAGOGASTRODUODENOSCOPY N/A 02/22/2015   Procedure: ESOPHAGOGASTRODUODENOSCOPY (EGD);  Surgeon: Hulen Luster, MD;  Location: [facility] ENDOSCOPY;  Service: Endoscopy;  Laterality: N/A;  . INCISION AND DRAINAGE ABSCESS Right 2007   groin; with ICU stay due to sepsis.  Marland Kitchen LAPAROSCOPIC  CHOLECYSTECTOMY  2011  . LEFT HEART CATHETERIZATION WITH CORONARY ANGIOGRAM N/A 07/21/2012   Procedure: LEFT HEART CATHETERIZATION WITH CORONARY ANGIOGRAM;  Surgeon: Jolaine Artist, MD;  Location: [facility] CATH LAB;  Service: Cardiovascular;  Laterality: N/A;  . [place]   L4-5  . PERCUTANEOUS CORONARY STENT INTERVENTION (PCI-S) N/A 07/23/2012   Procedure: PERCUTANEOUS CORONARY STENT INTERVENTION (PCI-S);  Surgeon: Sherren Mocha, MD;  Location: [facility] CATH LAB;  Service: Cardiovascular;  Laterality: N/A;  . PERIPHERAL VASCULAR CATHETERIZATION N/A 02/10/2015   Procedure: Picc Line Insertion;  Surgeon: Katha Cabal, MD;  Location: [facility] CV LAB;  Service: Cardiovascular;  Laterality: N/A;  . TUBAL LIGATION  1981  . VAGINAL HYSTERECTOMY  01/2011   Fibroids/DUB.  Ovaries removed. [place].    SOCIAL HISTORY:   Social History   Tobacco Use  . Smoking status: Never Smoker  . Smokeless tobacco: Never Used  Substance Use Topics  . Alcohol use: No    Alcohol/week: 0.0 standard drinks    FAMILY HISTORY:   Family History  Problem Relation Age of Onset  . Diabetes Mother   . Hypertension Mother   . Arthritis Mother        knees, lumbar DDD, cervical DDD  . Cancer Father        prostate,skin,lymphoma.  . Cancer Brother 78       bladder cancer; non-smoker  . Diabetes Maternal Grandmother   . Heart disease Maternal Grandmother   . COPD Maternal Grandmother   . Diabetes Paternal Grandmother   . Obesity Brother   . Diabetes Son   . Hypertension Son   . Cancer Maternal Grandfather   . Diabetes Paternal Grandfather     DRUG ALLERGIES:  No Known Allergies  REVIEW OF SYSTEMS:  Review of system unobtainable as the patient is encephalopathic  MEDICATIONS AT HOME:   Prior to Admission medications   Medication Sig Start Date End Date Taking? Authorizing Provider  AMBULATORY NON FORMULARY MEDICATION 1 Units by Other route once. Medication Name: foot brace 11/15/15   Yes Patel, Donika K, DO  Calcium Carb-Cholecalciferol (CALCIUM 600 + D PO) Take 1 tablet by mouth 2 (two) times daily.   Yes [provider]  citalopram (CELEXA) 20 MG tablet Take 1 tablet (20 mg total) by mouth daily. 04/15/18  Yes Wardell Honour, MD  clopidogrel (PLAVIX) 75 MG tablet Take 1 tablet (75 mg total) by mouth daily. Patient taking differently: Take 75 mg by mouth at bedtime.  04/15/18  Yes Wardell Honour, MD  ferrous sulfate 325 (65 FE) MG tablet Take 325 mg by mouth daily.   Yes [provider]  glimepiride (AMARYL) 4 MG tablet Take 1 tablet (4 mg total) by mouth 2 (two) times daily with a meal. 04/15/18  Yes Wardell Honour, MD  glucose blood (ONE TOUCH ULTRA TEST) test strip Check sugar three times daily dx: DMII uncontrolled insulin dependent with complications 6/59/93  Yes Wardell Honour, MD  Insulin Glargine (LANTUS) 100 UNIT/ML Solostar Pen  Inject 30 Units into the skin at bedtime. 04/15/18  Yes Wardell Honour, MD  Insulin Pen Needle (B-D ULTRAFINE III SHORT PEN) 31G X 8 MM MISC USE AS DIRECTED WITH LANTUS; dx: DMII uncontrolled, insulin dependent, with complications 6/38/75  Yes Wardell Honour, MD  losartan (COZAAR) 25 MG tablet Take 25 mg by mouth at bedtime.   Yes [provider]  omega-3 acid ethyl esters (LOVAZA) 1 g capsule Take 1 g by mouth 2 (two) times daily.   Yes [provider]  torsemide (DEMADEX) 20 MG tablet Take 1 tablet (20 mg total) by mouth daily. Patient taking differently: Take 20 mg by mouth at bedtime.  04/15/18  Yes Wardell Honour, MD  traMADol (ULTRAM) 50 MG tablet Take 1 tablet (50 mg total) by mouth every 6 (six) hours as needed. for pain Patient taking differently: Take 50 mg by mouth every 6 (six) hours as needed for moderate pain.  04/15/18  Yes Wardell Honour, MD  Dulaglutide (TRULICITY) 6.43 PI/9.5JO SOPN Inject 0.75 mg into the skin once a week. Patient not taking: Reported on 09/28/2018 04/15/18   Wardell Honour, MD  gabapentin (NEURONTIN) 600 MG tablet Take 1 tablet (600 mg total) by mouth 3 (three) times daily. Patient not taking: Reported on 09/28/2018 04/15/18   Wardell Honour, MD  rosuvastatin (CRESTOR) 40 MG tablet TAKE 1 TABLET BY MOUTH ONCE DAILY Patient not taking: Reported on 09/28/2018 12/16/17   Minna Merritts, MD      VITAL SIGNS:  Blood pressure (!) 126/95, pulse (!) 114, temperature (!) 103.6 F (39.8 C), temperature source Rectal, resp. rate (!) 28, height '5\' 11"'$  (1.803 m), weight 130 kg, SpO2 95 %.  PHYSICAL EXAMINATION:  GENERAL:  62 y.o.-year-old patient lying in the bed with no acute distress.  EYES: Pupils equal, round, reactive to light and accommodation. No scleral icterus. Extraocular muscles intact.  HEENT: Head atraumatic, normocephalic. Oropharynx and nasopharynx clear.  NECK:  Supple, no jugular venous distention. No thyroid enlargement, no tenderness.  LUNGS: Normal breath sounds bilaterally, no wheezing, rales,rhonchi or crepitation. No use of accessory muscles of respiration.  CARDIOVASCULAR: S1, S2 normal. No murmurs, rubs, or gallops.  ABDOMEN: Soft, nontender, nondistended. Bowel sounds present.  EXTREMITIES: Left second toe erythematous edematous tender.  No purulent discharge NEUROLOGIC: Arousable but disoriented and encephalopathic PSYCHIATRIC: The patient is encephalopathic SKIN: No obvious rash, lesion, or ulcer.   LABORATORY PANEL:   CBC Recent Labs  Lab 09/28/18 1213  WBC 10.9*  HGB 11.8*  HCT 35.2*  PLT 81*   ------------------------------------------------------------------------------------------------------------------  Chemistries  Recent Labs  Lab 09/28/18 1213  NA 131*  K 3.4*  CL 96*  CO2 22  GLUCOSE 390*  BUN 49*  CREATININE 1.79*  CALCIUM 8.2*  AST 40  ALT 22  ALKPHOS 51  BILITOT 1.3*    ------------------------------------------------------------------------------------------------------------------  Cardiac Enzymes Recent Labs  Lab 09/28/18 1213  TROPONINI 0.35*   ------------------------------------------------------------------------------------------------------------------  RADIOLOGY:  Dg Chest Port 1 View  Result Date: 09/28/2018 CLINICAL DATA:  Pt with altered mental status and elevated temp emesis x 1 day Pt not able to get OOB the last few days Infection to left second toe noted. Hx of diabetes, hypertension, heart attack, CHF, CAD, defibrillator. Non smokerams. 2nd toe swollen EXAM: PORTABLE CHEST 1 VIEW COMPARISON:  02/24/2018 FINDINGS: LEFT-sided pacemaker overlies normal cardiac silhouette. Low lung volumes. No effusion, infiltrate pneumothorax. No acute osseous abnormality. IMPRESSION: Low lung volumes.  No acute findings  Electronically Signed   By: Suzy Bouchard M.D.   On: 09/28/2018 12:57   Dg Foot Complete Left  Result Date: 09/28/2018 CLINICAL DATA:  Second toe swelling.  Altered mental status. EXAM: LEFT FOOT - COMPLETE 3+ VIEW COMPARISON:  05/03/2016. FINDINGS: There is diffuse soft tissue swelling involving the forefoot. Periarticular bony resorption at the second PIP joint is identified and may be slightly progressive when compared with 05/03/2016. No acute fractures or dislocations identified. Moderate degenerative changes within the midfoot identified. Plantar heel spur noted. IMPRESSION: 1. Soft tissue swelling and periarticular bony resorption identified at the second PIP joint. Findings are compatible with either inflammatory or infectious arthropathy. Electronically Signed   By: Kerby Moors M.D.   On: 09/28/2018 13:03    EKG:   Orders placed or performed during the hospital encounter of 09/28/18  . ED EKG 12-Lead  . ED EKG 12-Lead  . EKG 12-Lead  . EKG 12-Lead  . EKG 12-Lead  . EKG 12-Lead    IMPRESSION AND PLAN:   #Metabolic  encephalopathy secondary to sepsis Pancultures, IV fluids, IV antibiotics broad-spectrum Neurochecks  #Sepsis source could be left second toe infection with complaint of other etiology Admit to MedSurg unit Patient met septic criteria at the time of admission with fever, leukocytosis, elevated lactic acid Pancultures Broad-spectrum IV antibiotics with cefepime, Vanco and Flagyl Aggressive hydration with IV fluids Monitor lactic acid and procalcitonin levels   #Left toe ulcer IV antibiotics, IV fluids and podiatry consult placed  #Diabetes mental sliding scale insulin  #Chronic history of systolic congestive heart failure Patient is n.p.o. and septic provide IV fluids  Monitor for symptoms and signs of fluid overload    All the records are reviewed and case discussed with ED provider. Management plans discussed with the patient, family and they are in agreement.  CODE STATUS: FC   TOTAL TIME TAKING CARE OF THIS PATIENT: 50 minutes.   Note: This dictation was prepared with Dragon dictation along with smaller phrase technology. Any transcriptional errors that result from this process are unintentional.  Nicholes Mango M.D on 09/28/2018 at 3:00 PM  Between 7am to 6pm - Pager - [phone]  After 6pm go to [email or web address] - password [email or web address] [facility]  Office  [phone]  CC: Primary care physician; Rutherford Guys, MD

## 2018-09-28 NOTE — ED Triage Notes (Signed)
She arrives today via [facility] from home  Pt with altered mental status and elevated temp    emesis x 1 day  Pt not able to get OOB the last few days   Infection to left second toe noted

## 2018-09-28 NOTE — Consult Note (Signed)
PULMONARY/CCM CONSULT NOTE  Requesting MD/Service: Inpatient hospitalist service Date of initial consultation: 09/29/2015 Reason for consultation: Severe sepsis, respiratory distress  PT PROFILE: 62 y.o. female never smoker admitted via [facility] ED with 24 hours of N/V/D, AMS, high fever.  Diagnosis of severe sepsis with unclear source.  Possible sources include urine, pneumonia, left second toe cellulitis/osteomyelitis  DATA: 12/23 L foot plain Xray: Soft tissue swelling and periarticular bony resorption identified at the second PIP joint. Findings are compatible with either inflammatory or infectious arthropathy  INTERVAL:  HPI:  Level 5 caveat.  Patient presently on BiPAP.  She is able to answer questions and follow commands.  History provided by her husband.  She developed nausea and vomiting on the morning of 12/22.  She stayed in bed most of the day and did not eat or drink much.  Her husband also reports diarrhea.  On the morning of admission he noted ongoing nausea and vomiting as well as altered mental status.  She was brought to the emergency department where she was found to have very high fever.  Code sepsis initiated.  While in the emergency department, she developed worsening respiratory distress requiring initiation of BiPAP.  Past Medical History:  Diagnosis Date  . Acute myocardial infarction, subendocardial infarction, initial episode of care ([place]) 07/21/2012  . Acute osteomyelitis involving ankle and foot ([place]) 02/06/2015  . Acute systolic heart failure ([place]) 07/21/2012   New onset 07/19/12; admission to [facility] ED. Elevated Troponins.  S/p 2D-echo with EF 20-25%.  S/p cardiac catheterization with stenting LAD.  Repeat 2D-echo 10/2011 with improved EF of 35%.   . Anemia   . Automatic implantable cardioverter-defibrillator in situ    a. MDT CRT-D 06/2014, SN: [id number]    . CAD (coronary artery disease)    a. cardiac cath 101/04/2012: PCI/DES to chronically occluded mLAD,  consideration PCI to diag branch in 4 weeks.   . Cataract   . Chicken pox   . Chronic systolic CHF (congestive heart failure) (HCC)    a. mixed ICM & NICM; b. EF 20-25% by echo 07/2012, mid-dist 2/3 of LV sev HK/AK, mild MR. echo 10/2012: EF 30-35%, sev HK ant-septal & inf walls, GR1DD, mild MR, PASP 33. c. echo 02/2013: EF 30%, GR1DD, mild MR. echo 04/2014: EF 30%, Septal-lat dyssynchrony, global HK, inf AK, GR1DD, mild MR. d. echo 10/2014: EF50-55%, WM nl, GR1DD, septal mild paradox. e. echo 02/2015: EF 50-55%, wm   . Depression   . Heart attack ([place])   . Heel ulcer ([place]) 04/27/2015  . History of blood transfusion ~ 2011   "plasma; had neuropathy; couldn't walk"  . Hypertension   . LBBB (left bundle branch block)   . Neuromuscular disorder ([place])   . Neuropathy 2011  . Obesity, unspecified   . OSA on CPAP    Moderate with AHI 23/hr and now on CPAP at 16cm H2O.  Her DME is [facility]  . Pure hypercholesterolemia   . Type II diabetes mellitus ([place])   . Unspecified vitamin D deficiency     Past Surgical History:  Procedure Laterality Date  . AMPUTATION TOE Right 06/18/2015   Procedure: AMPUTATION TOE;  Surgeon: Samara Deist, DPM;  Location: [facility] ORS;  Service: Podiatry;  Laterality: Right;  . BACK SURGERY    . BI-VENTRICULAR IMPLANTABLE CARDIOVERTER DEFIBRILLATOR N/A 07/06/2014   Procedure: BI-VENTRICULAR IMPLANTABLE CARDIOVERTER DEFIBRILLATOR  (CRT-D);  Surgeon: Deboraha Sprang, MD;  Location: [facility] CATH LAB;  Service: Cardiovascular;  Laterality: N/A;  .  BI-VENTRICULAR IMPLANTABLE CARDIOVERTER DEFIBRILLATOR  (CRT-D)  07/06/2014  . BILATERAL OOPHORECTOMY  01/2011   ovarian cyst benign  . COLONOSCOPY WITH PROPOFOL Left 02/22/2015   Procedure: COLONOSCOPY WITH PROPOFOL;  Surgeon: Hulen Luster, MD;  Location: [facility] ENDOSCOPY;  Service: Endoscopy;  Laterality: Left;  . CORONARY ANGIOPLASTY WITH STENT PLACEMENT Left 07/2012   new onset systolic CHF; elevated troponins.  Cardiac catheterization with stenting to  LAD; EF 15%.  2D-echo: EF 20-25%.  . ESOPHAGOGASTRODUODENOSCOPY N/A 02/22/2015   Procedure: ESOPHAGOGASTRODUODENOSCOPY (EGD);  Surgeon: Hulen Luster, MD;  Location: [facility] ENDOSCOPY;  Service: Endoscopy;  Laterality: N/A;  . INCISION AND DRAINAGE ABSCESS Right 2007   groin; with ICU stay due to sepsis.  Marland Kitchen LAPAROSCOPIC CHOLECYSTECTOMY  2011  . LEFT HEART CATHETERIZATION WITH CORONARY ANGIOGRAM N/A 07/21/2012   Procedure: LEFT HEART CATHETERIZATION WITH CORONARY ANGIOGRAM;  Surgeon: Jolaine Artist, MD;  Location: [facility] CATH LAB;  Service: Cardiovascular;  Laterality: N/A;  . [place]   L4-5  . PERCUTANEOUS CORONARY STENT INTERVENTION (PCI-S) N/A 07/23/2012   Procedure: PERCUTANEOUS CORONARY STENT INTERVENTION (PCI-S);  Surgeon: Sherren Mocha, MD;  Location: [facility] CATH LAB;  Service: Cardiovascular;  Laterality: N/A;  . PERIPHERAL VASCULAR CATHETERIZATION N/A 02/10/2015   Procedure: Picc Line Insertion;  Surgeon: Katha Cabal, MD;  Location: [place] CV LAB;  Service: Cardiovascular;  Laterality: N/A;  . TUBAL LIGATION  1981  . VAGINAL HYSTERECTOMY  01/2011   Fibroids/DUB.  Ovaries removed. [place].    MEDICATIONS: I have reviewed all medications and confirmed regimen as documented  Social History   Socioeconomic History  . Marital status: Married    Spouse name: [name]  . Number of children: 2  . Years of education: 31  . Highest education level: High school graduate  Occupational History  . Occupation: disabled    Fish farm manager: UNEMPLOYED    Comment: 03/2010 for peripheral neuropathy  . 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  Social Needs  . Financial resource strain: Not hard at all  . Food insecurity:    Worry: Never true    Inability: Never true  . Transportation needs:    Medical: No    Non-medical: No  Tobacco Use  . Smoking status: Never Smoker  . Smokeless tobacco: Never Used  Substance and Sexual Activity  . Alcohol use: No    Alcohol/week:  0.0 standard drinks  . Drug use: No  . Sexual activity: Never    Birth control/protection: Post-menopausal, Surgical  Lifestyle  . Physical activity:    Days per week: 0 days    Minutes per session: 0 min  . Stress: Rather much  Relationships  . Social connections:    Talks on phone: More than three times a week    Gets together: More than three times a week    Attends religious service: Never    Active member of club or organization: No    Attends meetings of clubs or organizations: Never    Relationship status: Married  . Intimate partner violence:    Fear of current or ex partner: No    Emotionally abused: No    Physically abused: No    Forced sexual activity: No  Other Topics Concern  . Not on file  Social History Narrative   Marital status:Married x 45 yrs. Happily married, no abuse.       Children:  2 children (daughter 55, son 88). Two grandsons and 2 step grandchildren.  1 gg.      Lives: with husband, daughter, son-in-law, 2 grandsons, and 1 on the way.      Employment: disability for peripheral neuropathy 2012.  Previously had home daycare.      Tobacco: none      Alcohol: none      Drugs: none      Exercise: none. [employer].      Pets: dog.      Always uses seat belts, smoke detectors in home.      No guns in the home.       Caffeine use: 2 cups coffee per day.       Nutrition: Well balanced diet.    Family History  Problem Relation Age of Onset  . Diabetes Mother   . Hypertension Mother   . Arthritis Mother        knees, lumbar DDD, cervical DDD  . Cancer Father        prostate,skin,lymphoma.  . Cancer Brother 54       bladder cancer; non-smoker  . Diabetes Maternal Grandmother   . Heart disease Maternal Grandmother   . COPD Maternal Grandmother   . Diabetes Paternal Grandmother   . Obesity Brother   . Diabetes Son   . Hypertension Son   . Cancer Maternal Grandfather   . Diabetes Paternal Grandfather     ROS: Level 5  caveat   Vitals:   09/28/18 1445 09/28/18 1540 09/28/18 1549 09/28/18 1600  BP:   (!) 133/106 (!) 71/57  Pulse:  (!) 142 (!) 131 (!) 131  Resp:   (!) 28 (!) 37  Temp: (!) 103.6 F (39.8 C)     TempSrc: Rectal     SpO2:  99% 98% 100%  Weight:      Height:         EXAM:  Gen: WDWN, adequately supported on BiPAP HEENT: NCAT, sclera white, oropharynx normal Neck: Supple without LAN, thyromegaly.  JVP not visualized Lungs: R > L bibasilar crackles, no wheezes Cardiovascular: Tachycardia, regular, no murmurs noted.  Paced rhythm on monitor Abdomen: Obese, soft, nontender, normal BS Ext: without clubbing, cyanosis, edema.  Second toe on right foot amputated.  Second toe on left foot edematous, erythematous but not tender Neuro: CNs grossly intact, moves all extremities.  DTR symmetric. Skin: Limited exam, no lesions noted  DATA:   BMP Latest Ref Rng & Units 09/28/2018 07/20/2018 04/15/2018  Glucose 70 - 99 mg/dL 390(H) 64(L) 150(H)  BUN 8 - 23 mg/dL 49(H) 43(H) 25  Creatinine 0.44 - 1.00 mg/dL 1.79(H) 2.00(H) 1.33(H)  BUN/Creat Ratio 12 - 28 - 22 19  Sodium 135 - 145 mmol/L 131(L) 140 141  Potassium 3.5 - 5.1 mmol/L 3.4(L) 4.5 5.7(H)  Chloride 98 - 111 mmol/L 96(L) 96 103  CO2 22 - 32 mmol/L 22 24 25   Calcium 8.9 - 10.3 mg/dL 8.2(L) 9.1 9.0    CBC Latest Ref Rng & Units 09/28/2018 04/15/2018 02/24/2018  WBC 4.0 - 10.5 K/uL 10.9(H) 8.2 9.1  Hemoglobin 12.0 - 15.0 g/dL 11.8(L) 12.3 10.9(L)  Hematocrit 36.0 - 46.0 % 35.2(L) 38.0 33.6(L)  Platelets 150 - 400 K/uL 81(L) 178 147(L)    CXR: No acute cardiac or pulmonary findings  I have personally reviewed all chest radiographs reported above including CXRs and CT chest unless otherwise indicated  MAJOR EVENTS/TEST RESULTS: 12/23 admission as documented above.  Admission diagnosis of severe sepsis, unclear source with acute respiratory distress. Admitted to ICU/SDU on  BiPAP  INDWELLING DEVICES::   MICRO DATA: MRSA PCR 12/23  >>  Urine 12/23 >>  Resp 12/23 >>  Blood 12/23 >>  GI panel 12/23 >>  C diff PCR 12/23 >>   ANTIMICROBIALS:  Vancomycin 12/23 >>  Metronidazole 12/23 >>  Cefepime 12/23 >>     IMPRESSION:   1) Severe sepsis, unclear source. Possibilities include pneumonia, UTI, cellulitis/osteomyelitis of left foot 2) respiratory distress/acute respiratory failure requiring BiPAP 3) history of CAD/MI, HFpEF (last LVEF 55-60%) 4) minimally elevated troponin I-likely demand ischemia 5) AKI (baseline creatinine 1.3) 6) nausea/vomiting/diarrhea 7) type 2 diabetes with hyperglycemia 8) Pyuria 9) Suspected infectious arthritis of L 2nd toe 10) acute thrombocytopenia 11) acute encephalopathy due to sepsis   PLAN:  Admit to ICU BiPAP as needed for ventilatory support Supplemental oxygen as needed to maintain SPO2 >90% Recheck CXR in a.m. 12/24 ICU hemodynamic monitoring Monitor BMET intermittently Monitor I/Os Correct electrolytes as indicated Insert Foley catheter to track urine output Monitor temp, WBC count Micro and abx as above PCT protocol ordered DVT px: Enoxaparin Monitor CBC intermittently Transfuse per usual guidelines Monitor platelet count closely while on enoxaparin Minimize psychoactive medications    CCM time: 35 mins The above time includes time spent in consultation with patient and/or family members and reviewing care plan with admitting service  Merton Border, MD PCCM service Mobile [phone] Pager [phone] 09/28/2018 4:24 PM

## 2018-09-28 NOTE — ED Notes (Addendum)
Per pt daughter, pt got up to cook yesterday morning and was vomiting in the trash can and went and laid back down , states she laid in the bed all day with N/V and when her husband checked on her this morning the pt altered not acting herself covered in urine and feces. Daughter states they attempted to clean her up and get her to drink something but she vomited it back up. Pt is not oriented on arrival to self, place or event. Pt is not able to follow simple commands. Pt mucous membranes are dry. Pt has been clean of feces and urine on arrival and linens changed. Pt also has redness and swelling to the left 2nd toe unknown how long it has been this way, per daughter.

## 2018-09-28 NOTE — Progress Notes (Signed)
Lab called with a critical troponin of 2.89. Dr. Alva Garnet notified of same. Pt also noted to have a MAP of 60, Dr. Alva Garnet notified. Verbal orders were given for a BNP at 0500 and a 500cc fluid bolus to be given now. Will continue to monitor.

## 2018-09-28 NOTE — ED Notes (Signed)
ED TO INPATIENT HANDOFF REPORT  Name/Age/Gender [name] 62 y.o. female  Code Status Code Status History    Date Active Date Inactive Code Status Order ID Comments User Context   11/11/2015 1411 11/12/2015 1635 Full Code [id number]  Vaughan Basta, MD Inpatient   06/17/2015 0148 06/19/2015 1659 Full Code [id number]  Harrie Foreman, MD Inpatient   02/17/2015 1330 02/24/2015 1925 DNR [id number]  Bettey Costa, MD ED   07/06/2014 1100 07/07/2014 1524 Full Code [id number]  Deboraha Sprang, MD Inpatient   07/22/2012 1427 07/26/2012 1507 DNR [id number]  Nolon Rod, DO Inpatient      Home/SNF/Other Home  Chief Complaint Code Sepsis  Level of Care/Admitting Diagnosis ED Disposition    ED Disposition Condition [place]: [place] [100120]  Level of Care: Med-Surg [16]  Diagnosis: Sepsis [facility]) [[id number]]  Admitting Physician: Nicholes Mango [[id number]]  Attending Physician: Nicholes Mango [[id number]]  Estimated length of stay: 3 - 4 days  Certification:: I certify this patient will need inpatient services for at least 2 midnights  PT Class (Do Not Modify): Inpatient [101]  PT Acc Code (Do Not Modify): Private [1]       Medical History Past Medical History:  Diagnosis Date  . Acute myocardial infarction, subendocardial infarction, initial episode of care ([place]) 07/21/2012  . Acute osteomyelitis involving ankle and foot ([place]) 02/06/2015  . Acute systolic heart failure ([place]) 07/21/2012   New onset 07/19/12; admission to [facility] ED. Elevated Troponins.  S/p 2D-echo with EF 20-25%.  S/p cardiac catheterization with stenting LAD.  Repeat 2D-echo 10/2011 with improved EF of 35%.   . Anemia   . Automatic implantable cardioverter-defibrillator in situ    a. MDT CRT-D 06/2014, SN: [id number]    . CAD (coronary artery disease)    a. cardiac cath 101/04/2012: PCI/DES to chronically occluded mLAD, consideration PCI to diag branch in 4 weeks.   . Cataract    . Chicken pox   . Chronic systolic CHF (congestive heart failure) (HCC)    a. mixed ICM & NICM; b. EF 20-25% by echo 07/2012, mid-dist 2/3 of LV sev HK/AK, mild MR. echo 10/2012: EF 30-35%, sev HK ant-septal & inf walls, GR1DD, mild MR, PASP 33. c. echo 02/2013: EF 30%, GR1DD, mild MR. echo 04/2014: EF 30%, Septal-lat dyssynchrony, global HK, inf AK, GR1DD, mild MR. d. echo 10/2014: EF50-55%, WM nl, GR1DD, septal mild paradox. e. echo 02/2015: EF 50-55%, wm   . Depression   . Heart attack ([place])   . Heel ulcer ([place]) 04/27/2015  . History of blood transfusion ~ 2011   "plasma; had neuropathy; couldn't walk"  . Hypertension   . LBBB (left bundle branch block)   . Neuromuscular disorder ([place])   . Neuropathy 2011  . Obesity, unspecified   . OSA on CPAP    Moderate with AHI 23/hr and now on CPAP at 16cm H2O.  Her DME is [facility]  . Pure hypercholesterolemia   . Type II diabetes mellitus ([place])   . Unspecified vitamin D deficiency     Allergies No Known Allergies  IV Location/Drains/Wounds Patient Lines/Drains/Airways Status   Active Line/Drains/Airways    Name:   Placement date:   Placement time:   Site:   Days:   Peripheral IV 09/28/18 Right Hand   09/28/18    no documentation    Hand   less than 1   Peripheral IV 09/28/18 Left Wrist   09/28/18  1205    Wrist   less than 1   Peripheral IV 09/28/18 Right Antecubital   09/28/18    1215    Antecubital   less than 1          Labs/Imaging Results for orders placed or performed during the hospital encounter of 09/28/18 (from the past 48 hour(s))  Urinalysis, Routine w reflex microscopic     Status: Abnormal   Collection Time: 09/28/18 12:02 PM  Result Value Ref Range   Color, Urine YELLOW (A) YELLOW   APPearance CLOUDY (A) CLEAR   Specific Gravity, Urine 1.019 1.005 - 1.030   pH 5.0 5.0 - 8.0   Glucose, UA 150 (A) NEGATIVE mg/dL   Hgb urine dipstick LARGE (A) NEGATIVE   Bilirubin Urine NEGATIVE NEGATIVE   Ketones, ur 5 (A) NEGATIVE mg/dL    Protein, ur 100 (A) NEGATIVE mg/dL   Nitrite NEGATIVE NEGATIVE   Leukocytes, UA SMALL (A) NEGATIVE   RBC / HPF 6-10 0 - 5 RBC/hpf   WBC, UA 21-50 0 - 5 WBC/hpf   Bacteria, UA RARE (A) NONE SEEN   Squamous Epithelial / LPF 0-5 0 - 5   Mucus PRESENT    Amorphous Crystal PRESENT     Comment: Performed at [facility], [address], [place], [place] [address]  Influenza panel by PCR (type A & B)     Status: None   Collection Time: 09/28/18 12:02 PM  Result Value Ref Range   Influenza A By PCR NEGATIVE NEGATIVE   Influenza B By PCR NEGATIVE NEGATIVE    Comment: (NOTE) The Xpert Xpress Flu assay is intended as an aid in the diagnosis of  influenza and should not be used as a sole basis for treatment.  This  assay is FDA approved for nasopharyngeal swab specimens only. Nasal  washings and aspirates are unacceptable for Xpert Xpress Flu testing. Performed at [facility], [address] [place], [place] [address]   CG4 I-STAT (Lactic acid)     Status: Abnormal   Collection Time: 09/28/18 12:02 PM  Result Value Ref Range   Lactic Acid, Venous 2.93 (HH) 0.5 - 1.9 mmol/L   Comment NOTIFIED PHYSICIAN   Comprehensive metabolic panel     Status: Abnormal   Collection Time: 09/28/18 12:13 PM  Result Value Ref Range   Sodium 131 (L) 135 - 145 mmol/L   Potassium 3.4 (L) 3.5 - 5.1 mmol/L   Chloride 96 (L) 98 - 111 mmol/L   CO2 22 22 - 32 mmol/L   Glucose, Bld 390 (H) 70 - 99 mg/dL   BUN 49 (H) 8 - 23 mg/dL   Creatinine, Ser 1.79 (H) 0.44 - 1.00 mg/dL   Calcium 8.2 (L) 8.9 - 10.3 mg/dL   Total Protein 6.6 6.5 - 8.1 g/dL   Albumin 3.4 (L) 3.5 - 5.0 g/dL   AST 40 15 - 41 U/L   ALT 22 0 - 44 U/L   Alkaline Phosphatase 51 38 - 126 U/L   Total Bilirubin 1.3 (H) 0.3 - 1.2 mg/dL   GFR calc non Af Amer 29 (L) >60 mL/min   GFR calc Af Amer 34 (L) >60 mL/min   Anion gap 13 5 - 15    Comment: Performed at [facility], [address] [place], [place] [address]   CBC WITH DIFFERENTIAL     Status: Abnormal   Collection Time: 09/28/18 12:13 PM  Result Value Ref Range   WBC 10.9 (  H) 4.0 - 10.5 K/uL   RBC 3.92 3.87 - 5.11 MIL/uL   Hemoglobin 11.8 (L) 12.0 - 15.0 g/dL   HCT 35.2 (L) 36.0 - 46.0 %   MCV 89.8 80.0 - 100.0 fL   MCH 30.1 26.0 - 34.0 pg   MCHC 33.5 30.0 - 36.0 g/dL   RDW 12.8 11.5 - 15.5 %   Platelets 81 (L) 150 - 400 K/uL    Comment: Immature Platelet Fraction may be clinically indicated, consider ordering this additional test [id number]    nRBC 0.0 0.0 - 0.2 %   Neutrophils Relative % 78 %   Neutro Abs 8.5 (H) 1.7 - 7.7 K/uL   Lymphocytes Relative 12 %   Lymphs Abs 1.3 0.7 - 4.0 K/uL   Monocytes Relative 8 %   Monocytes Absolute 0.9 0.1 - 1.0 K/uL   Eosinophils Relative 2 %   Eosinophils Absolute 0.2 0.0 - 0.5 K/uL   Basophils Relative 0 %   Basophils Absolute 0.0 0.0 - 0.1 K/uL   RBC Morphology MORPHOLOGY UNREMARKABLE    Abs Immature Granulocytes 0.00 0.00 - 0.07 K/uL   Giant PLTs PRESENT     Comment: Performed at [facility], [address] [place], [place] [address]  Troponin I - ONCE - STAT     Status: Abnormal   Collection Time: 09/28/18 12:13 PM  Result Value Ref Range   Troponin I 0.35 (HH) <0.03 ng/mL    Comment: CRITICAL RESULT CALLED TO, READ BACK BY AND VERIFIED WITH SHANNON MARTIN ON 09/28/18 AT 1304 JJB Performed at [facility], [address] [place], [place] [address]    Dg Chest Port 1 View  Result Date: 09/28/2018 CLINICAL DATA:  Pt with altered mental status and elevated temp emesis x 1 day Pt not able to get OOB the last few days Infection to left second toe noted. Hx of diabetes, hypertension, heart attack, CHF, CAD, defibrillator. Non smokerams. 2nd toe swollen EXAM: PORTABLE CHEST 1 VIEW COMPARISON:  02/24/2018 FINDINGS: LEFT-sided pacemaker overlies normal cardiac silhouette. Low lung volumes. No effusion, infiltrate pneumothorax. No acute osseous abnormality. IMPRESSION: Low  lung volumes.  No acute findings Electronically Signed   By: Suzy Bouchard M.D.   On: 09/28/2018 12:57   Dg Foot Complete Left  Result Date: 09/28/2018 CLINICAL DATA:  Second toe swelling.  Altered mental status. EXAM: LEFT FOOT - COMPLETE 3+ VIEW COMPARISON:  05/03/2016. FINDINGS: There is diffuse soft tissue swelling involving the forefoot. Periarticular bony resorption at the second PIP joint is identified and may be slightly progressive when compared with 05/03/2016. No acute fractures or dislocations identified. Moderate degenerative changes within the midfoot identified. Plantar heel spur noted. IMPRESSION: 1. Soft tissue swelling and periarticular bony resorption identified at the second PIP joint. Findings are compatible with either inflammatory or infectious arthropathy. Electronically Signed   By: Kerby Moors M.D.   On: 09/28/2018 13:03    Pending Labs Unresulted Labs (From admission, onward)    Start     Ordered   09/30/18 2330  Vancomycin, trough  Once-Timed,   STAT     09/28/18 1318   09/28/18 1154  Blood Culture (routine x 2)  BLOOD CULTURE X 2,   STAT     09/28/18 1155   09/28/18 1154  Urine culture  ONCE - STAT,   STAT     09/28/18 1155          Vitals/Pain Today's Vitals   09/28/18 1217 09/28/18 1230 09/28/18  1300 09/28/18 1330  BP:  (Abnormal) 151/44 126/71 (Abnormal) 133/57  Pulse:  97 (Abnormal) 104 (Abnormal) 103  Resp:  19 (Abnormal) 22   Temp: (Abnormal) 101.4 F (38.6 C)     TempSrc: Rectal     SpO2:  98% 99% 95%  Weight:      Height:      PainSc:        Isolation Precautions Droplet precaution  Medications Medications  metroNIDAZOLE (FLAGYL) IVPB 500 mg (500 mg Intravenous New Bag/Given 09/28/18 1340)  vancomycin (VANCOCIN) 1,250 mg in sodium chloride 0.9 % 250 mL IVPB (has no administration in time range)  ceFEPIme (MAXIPIME) 2 g in sodium chloride 0.9 % 100 mL IVPB (has no administration in time range)  ceFEPIme (MAXIPIME) 2 g in sodium  chloride 0.9 % 100 mL IVPB (0 g Intravenous Stopped 09/28/18 1301)  vancomycin (VANCOCIN) IVPB 1000 mg/200 mL premix (0 mg Intravenous Stopped 09/28/18 1339)  sodium chloride 0.9 % bolus 1,000 mL (1,000 mLs Intravenous New Bag/Given 09/28/18 1246)  LORazepam (ATIVAN) injection 1 mg (1 mg Intravenous Given 09/28/18 1344)

## 2018-09-29 ENCOUNTER — Inpatient Hospital Stay (HOSPITAL_COMMUNITY)
Admit: 2018-09-29 | Discharge: 2018-09-29 | Disposition: A | Discharge status: Home / Self Care | Payer: PPO | Attending: Pulmonary Disease | Admitting: Pulmonary Disease

## 2018-09-29 ENCOUNTER — Inpatient Hospital Stay: Payer: PPO

## 2018-09-29 ENCOUNTER — Other Ambulatory Visit: Payer: Self-pay

## 2018-09-29 DIAGNOSIS — Z794 Long term (current) use of insulin: Secondary | ICD-10-CM

## 2018-09-29 DIAGNOSIS — Z89421 Acquired absence of other right toe(s): Secondary | ICD-10-CM

## 2018-09-29 DIAGNOSIS — G9340 Encephalopathy, unspecified: Secondary | ICD-10-CM

## 2018-09-29 DIAGNOSIS — R7989 Other specified abnormal findings of blood chemistry: Secondary | ICD-10-CM

## 2018-09-29 DIAGNOSIS — R34 Anuria and oliguria: Secondary | ICD-10-CM

## 2018-09-29 DIAGNOSIS — I2510 Atherosclerotic heart disease of native coronary artery without angina pectoris: Secondary | ICD-10-CM

## 2018-09-29 DIAGNOSIS — B9561 Methicillin susceptible Staphylococcus aureus infection as the cause of diseases classified elsewhere: Secondary | ICD-10-CM

## 2018-09-29 DIAGNOSIS — N179 Acute kidney failure, unspecified: Secondary | ICD-10-CM

## 2018-09-29 DIAGNOSIS — D696 Thrombocytopenia, unspecified: Secondary | ICD-10-CM

## 2018-09-29 DIAGNOSIS — E118 Type 2 diabetes mellitus with unspecified complications: Secondary | ICD-10-CM

## 2018-09-29 DIAGNOSIS — I429 Cardiomyopathy, unspecified: Secondary | ICD-10-CM

## 2018-09-29 DIAGNOSIS — R197 Diarrhea, unspecified: Secondary | ICD-10-CM

## 2018-09-29 DIAGNOSIS — I998 Other disorder of circulatory system: Secondary | ICD-10-CM

## 2018-09-29 DIAGNOSIS — L03032 Cellulitis of left toe: Secondary | ICD-10-CM

## 2018-09-29 DIAGNOSIS — Z79899 Other long term (current) drug therapy: Secondary | ICD-10-CM

## 2018-09-29 DIAGNOSIS — R7881 Bacteremia: Secondary | ICD-10-CM

## 2018-09-29 DIAGNOSIS — Z95810 Presence of automatic (implantable) cardiac defibrillator: Secondary | ICD-10-CM

## 2018-09-29 LAB — GLUCOSE, CAPILLARY
GLUCOSE-CAPILLARY: 141 mg/dL — AB (ref 70–99)
Glucose-Capillary: 149 mg/dL — ABNORMAL HIGH (ref 70–99)
Glucose-Capillary: 154 mg/dL — ABNORMAL HIGH (ref 70–99)
Glucose-Capillary: 157 mg/dL — ABNORMAL HIGH (ref 70–99)
Glucose-Capillary: 177 mg/dL — ABNORMAL HIGH (ref 70–99)
Glucose-Capillary: 259 mg/dL — ABNORMAL HIGH (ref 70–99)
Glucose-Capillary: 291 mg/dL — ABNORMAL HIGH (ref 70–99)
Glucose-Capillary: 317 mg/dL — ABNORMAL HIGH (ref 70–99)

## 2018-09-29 LAB — TROPONIN I: Troponin I: 24.26 ng/mL (ref <0.03)

## 2018-09-29 LAB — BLOOD GAS, ARTERIAL
ACID-BASE DEFICIT: 1.7 mmol/L (ref 0.0–2.0)
Bicarbonate: 21.9 mmol/L (ref 20.0–28.0)
DELIVERY SYSTEMS: POSITIVE
Expiratory PAP: 8
FIO2: 0.4
Inspiratory PAP: 14
O2 SAT: 99.1 %
Patient temperature: 37
pCO2 arterial: 33 mmHg (ref 32.0–48.0)
pH, Arterial: 7.43 (ref 7.350–7.450)
pO2, Arterial: 132 mmHg — ABNORMAL HIGH (ref 83.0–108.0)

## 2018-09-29 LAB — C DIFFICILE QUICK SCREEN W PCR REFLEX
C Diff antigen: NEGATIVE
C Diff interpretation: NOT DETECTED
C Diff toxin: NEGATIVE

## 2018-09-29 LAB — BASIC METABOLIC PANEL
ANION GAP: 11 (ref 5–15)
BUN: 60 mg/dL — ABNORMAL HIGH (ref 8–23)
CO2: 21 mmol/L — ABNORMAL LOW (ref 22–32)
Calcium: 7.9 mg/dL — ABNORMAL LOW (ref 8.9–10.3)
Chloride: 104 mmol/L (ref 98–111)
Creatinine, Ser: 2.24 mg/dL — ABNORMAL HIGH (ref 0.44–1.00)
GFR calc non Af Amer: 22 mL/min — ABNORMAL LOW (ref >60)
GFR, EST AFRICAN AMERICAN: 26 mL/min — AB (ref >60)
Glucose, Bld: 182 mg/dL — ABNORMAL HIGH (ref 70–99)
Potassium: 3.3 mmol/L — ABNORMAL LOW (ref 3.5–5.1)
Sodium: 136 mmol/L (ref 135–145)

## 2018-09-29 LAB — CBC
HCT: 33.8 % — ABNORMAL LOW (ref 36.0–46.0)
Hemoglobin: 11.3 g/dL — ABNORMAL LOW (ref 12.0–15.0)
MCH: 30.1 pg (ref 26.0–34.0)
MCHC: 33.4 g/dL (ref 30.0–36.0)
MCV: 90.1 fL (ref 80.0–100.0)
Platelets: 62 10*3/uL — ABNORMAL LOW (ref 150–400)
RBC: 3.75 MIL/uL — ABNORMAL LOW (ref 3.87–5.11)
RDW: 13.1 % (ref 11.5–15.5)
WBC: 8.8 10*3/uL (ref 4.0–10.5)
nRBC: 0 % (ref 0.0–0.2)

## 2018-09-29 LAB — TSH: TSH: 1.115 u[IU]/mL (ref 0.350–4.500)

## 2018-09-29 LAB — COMPREHENSIVE METABOLIC PANEL
ALK PHOS: 40 U/L (ref 38–126)
ALT: 38 U/L (ref 0–44)
AST: 103 U/L — ABNORMAL HIGH (ref 15–41)
Albumin: 2.9 g/dL — ABNORMAL LOW (ref 3.5–5.0)
Anion gap: 8 (ref 5–15)
BUN: 58 mg/dL — ABNORMAL HIGH (ref 8–23)
CALCIUM: 7.9 mg/dL — AB (ref 8.9–10.3)
CO2: 23 mmol/L (ref 22–32)
Chloride: 105 mmol/L (ref 98–111)
Creatinine, Ser: 2.4 mg/dL — ABNORMAL HIGH (ref 0.44–1.00)
GFR calc Af Amer: 24 mL/min — ABNORMAL LOW (ref >60)
GFR calc non Af Amer: 21 mL/min — ABNORMAL LOW (ref >60)
GLUCOSE: 325 mg/dL — AB (ref 70–99)
Potassium: 3.5 mmol/L (ref 3.5–5.1)
Sodium: 136 mmol/L (ref 135–145)
Total Bilirubin: 0.8 mg/dL (ref 0.3–1.2)
Total Protein: 6 g/dL — ABNORMAL LOW (ref 6.5–8.1)

## 2018-09-29 LAB — PROTIME-INR
INR: 1.52
INR: 1.45
Prothrombin Time: 18.1 seconds — ABNORMAL HIGH (ref 11.4–15.2)
Prothrombin Time: 17.5 seconds — ABNORMAL HIGH (ref 11.4–15.2)

## 2018-09-29 LAB — CORTISOL-AM, BLOOD: Cortisol - AM: 38 ug/dL — ABNORMAL HIGH (ref 6.7–22.6)

## 2018-09-29 LAB — GASTROINTESTINAL PANEL BY PCR, STOOL (REPLACES STOOL CULTURE)
Adenovirus F40/41: NOT DETECTED
Astrovirus: NOT DETECTED
Campylobacter species: NOT DETECTED
Cryptosporidium: NOT DETECTED
Cyclospora cayetanensis: NOT DETECTED
ENTEROAGGREGATIVE E COLI (EAEC): NOT DETECTED
ENTEROPATHOGENIC E COLI (EPEC): NOT DETECTED
Entamoeba histolytica: NOT DETECTED
Enterotoxigenic E coli (ETEC): NOT DETECTED
GIARDIA LAMBLIA: NOT DETECTED
Norovirus GI/GII: NOT DETECTED
Plesimonas shigelloides: NOT DETECTED
ROTAVIRUS A: NOT DETECTED
Salmonella species: NOT DETECTED
Sapovirus (I, II, IV, and V): NOT DETECTED
Shiga like toxin producing E coli (STEC): NOT DETECTED
Shigella/Enteroinvasive E coli (EIEC): NOT DETECTED
Vibrio cholerae: NOT DETECTED
Vibrio species: NOT DETECTED
YERSINIA ENTEROCOLITICA: NOT DETECTED

## 2018-09-29 LAB — MAGNESIUM: Magnesium: 1.6 mg/dL — ABNORMAL LOW (ref 1.7–2.4)

## 2018-09-29 LAB — ECHOCARDIOGRAM COMPLETE
Height: 67 in
Weight: 4405.67 oz

## 2018-09-29 LAB — HEPARIN LEVEL (UNFRACTIONATED)
HEPARIN UNFRACTIONATED: 0.6 [IU]/mL (ref 0.30–0.70)
Heparin Unfractionated: 0.63 IU/mL (ref 0.30–0.70)

## 2018-09-29 LAB — HIV ANTIBODY (ROUTINE TESTING W REFLEX): HIV SCREEN 4TH GENERATION: NONREACTIVE

## 2018-09-29 LAB — BRAIN NATRIURETIC PEPTIDE: B Natriuretic Peptide: 1758 pg/mL — ABNORMAL HIGH (ref 0.0–100.0)

## 2018-09-29 LAB — PROCALCITONIN: Procalcitonin: 37.9 ng/mL

## 2018-09-29 LAB — APTT: aPTT: 43 seconds — ABNORMAL HIGH (ref 24–36)

## 2018-09-29 MED ORDER — MAGNESIUM SULFATE 2 GM/50ML IV SOLN
2.0000 g | Freq: Once | INTRAVENOUS | Status: AC
  Administered 2018-09-29: 2 g via INTRAVENOUS
  Filled 2018-09-29: qty 50

## 2018-09-29 MED ORDER — INSULIN ASPART 100 UNIT/ML ~~LOC~~ SOLN
0.0000 [IU] | Freq: Every day | SUBCUTANEOUS | Status: DC
  Administered 2018-10-01: 3 [IU] via SUBCUTANEOUS
  Administered 2018-10-02 – 2018-10-05 (×3): 2 [IU] via SUBCUTANEOUS
  Administered 2018-10-06: 3 [IU] via SUBCUTANEOUS
  Filled 2018-09-29 (×5): qty 1

## 2018-09-29 MED ORDER — POTASSIUM CHLORIDE CRYS ER 20 MEQ PO TBCR
40.0000 meq | EXTENDED_RELEASE_TABLET | Freq: Once | ORAL | Status: AC
  Administered 2018-09-29: 40 meq via ORAL
  Filled 2018-09-29: qty 2

## 2018-09-29 MED ORDER — INSULIN GLARGINE 100 UNIT/ML ~~LOC~~ SOLN
15.0000 [IU] | Freq: Every day | SUBCUTANEOUS | Status: DC
  Administered 2018-09-29 – 2018-10-03 (×5): 15 [IU] via SUBCUTANEOUS
  Filled 2018-09-29 (×5): qty 0.15

## 2018-09-29 MED ORDER — INSULIN ASPART 100 UNIT/ML ~~LOC~~ SOLN
0.0000 [IU] | SUBCUTANEOUS | Status: DC
  Administered 2018-09-29: 4 [IU] via SUBCUTANEOUS
  Administered 2018-09-29 (×2): 11 [IU] via SUBCUTANEOUS
  Filled 2018-09-29 (×3): qty 1

## 2018-09-29 MED ORDER — TRAMADOL HCL 50 MG PO TABS
50.0000 mg | ORAL_TABLET | Freq: Four times a day (QID) | ORAL | Status: DC | PRN
  Administered 2018-09-29 – 2018-10-06 (×10): 50 mg via ORAL
  Filled 2018-09-29 (×10): qty 1

## 2018-09-29 MED ORDER — INSULIN ASPART 100 UNIT/ML ~~LOC~~ SOLN
0.0000 [IU] | Freq: Three times a day (TID) | SUBCUTANEOUS | Status: DC
  Administered 2018-09-29: 4 [IU] via SUBCUTANEOUS
  Administered 2018-09-29: 3 [IU] via SUBCUTANEOUS
  Administered 2018-09-30 (×2): 4 [IU] via SUBCUTANEOUS
  Administered 2018-09-30: 7 [IU] via SUBCUTANEOUS
  Administered 2018-10-01: 4 [IU] via SUBCUTANEOUS
  Administered 2018-10-01: 11 [IU] via SUBCUTANEOUS
  Administered 2018-10-01: 5 [IU] via SUBCUTANEOUS
  Administered 2018-10-02 (×2): 4 [IU] via SUBCUTANEOUS
  Administered 2018-10-02: 3 [IU] via SUBCUTANEOUS
  Administered 2018-10-03 (×3): 7 [IU] via SUBCUTANEOUS
  Administered 2018-10-04: 11 [IU] via SUBCUTANEOUS
  Administered 2018-10-04: 4 [IU] via SUBCUTANEOUS
  Administered 2018-10-04 – 2018-10-05 (×2): 7 [IU] via SUBCUTANEOUS
  Administered 2018-10-05: 11 [IU] via SUBCUTANEOUS
  Administered 2018-10-06: 3 [IU] via SUBCUTANEOUS
  Administered 2018-10-06: 4 [IU] via SUBCUTANEOUS
  Administered 2018-10-07 (×3): 7 [IU] via SUBCUTANEOUS
  Administered 2018-10-08: 10 [IU] via SUBCUTANEOUS
  Administered 2018-10-08: 11 [IU] via SUBCUTANEOUS
  Administered 2018-10-09: 3 [IU] via SUBCUTANEOUS
  Filled 2018-09-29 (×28): qty 1

## 2018-09-29 MED ORDER — CEFAZOLIN SODIUM-DEXTROSE 1-4 GM/50ML-% IV SOLN
1.0000 g | Freq: Two times a day (BID) | INTRAVENOUS | Status: DC
  Administered 2018-09-29 – 2018-09-30 (×2): 1 g via INTRAVENOUS
  Filled 2018-09-29 (×3): qty 50

## 2018-09-29 MED ORDER — CEFAZOLIN SODIUM-DEXTROSE 2-4 GM/100ML-% IV SOLN
2.0000 g | Freq: Three times a day (TID) | INTRAVENOUS | Status: DC
  Administered 2018-09-29: 2 g via INTRAVENOUS
  Filled 2018-09-29 (×4): qty 100

## 2018-09-29 NOTE — Care Management (Signed)
Found down at home. Altered mental status. Troponin 24.26. Positive blood cultures and positive C diff.  Patient has inflamed 2nd toe on left foot. Possible source of sepsis symptoms.  She has had the same toe on the opposite foot amputated for same sx.   Cardiology consult pending.  Possible need for TEE to determine if any vegetation on valve or pacemaker. ID consult pending.  There is discussion of need for long term IV antibiotics

## 2018-09-29 NOTE — Progress Notes (Signed)
Patient taken off BIPAP and placed on 4lpm , tolerating well HR 90 RR 15 oxygen saturations 95%. RN aware.

## 2018-09-29 NOTE — Progress Notes (Signed)
ANTICOAGULATION CONSULT NOTE   Pharmacy Consult for Heparin  Indication: chest pain/ACS  Patient Measurements: Height: 5\' 7"  (170.2 cm) Weight: 275 lb 5.7 oz (124.9 kg) IBW/kg (Calculated) : 61.6 Heparin Dosing Weight:  101 kg   Vital Signs: Temp: 99.4 F (37.4 C) (12/24 1200) Temp Source: Oral (12/24 1200) BP: 128/59 (12/24 1400) Pulse Rate: 77 (12/24 1400)  Labs: Recent Labs    09/28/18 1213 09/28/18 1725 09/28/18 1902 09/28/18 2030 09/29/18 0217 09/29/18 1451  HGB 11.8*  --   --   --  11.3*  --   HCT 35.2*  --   --   --  33.8*  --   PLT 81*  --   --   --  62*  --   APTT  --   --   --  43*  --   --   LABPROT  --   --   --  18.1* 17.5*  --   INR  --   --   --  1.52 1.45  --   HEPARINUNFRC  --   --   --   --   --  0.60  CREATININE 1.79*  --   --   --  2.40* 2.24*  TROPONINI 0.35* 2.89* 5.20*  --  24.26*  --     Estimated Creatinine Clearance: 34.8 mL/min (A) (by C-G formula based on SCr of 2.24 mg/dL (H)).   Medical History: Past Medical History:  Diagnosis Date  . Acute myocardial infarction, subendocardial infarction, initial episode of care ([place]) 07/21/2012  . Acute osteomyelitis involving ankle and foot ([place]) 02/06/2015  . Acute systolic heart failure ([place]) 07/21/2012   New onset 07/19/12; admission to [facility] ED. Elevated Troponins.  S/p 2D-echo with EF 20-25%.  S/p cardiac catheterization with stenting LAD.  Repeat 2D-echo 10/2011 with improved EF of 35%.   . Anemia   . Automatic implantable cardioverter-defibrillator in situ    a. MDT CRT-D 06/2014, SN: [id number]    . CAD (coronary artery disease)    a. cardiac cath 101/04/2012: PCI/DES to chronically occluded mLAD, consideration PCI to diag branch in 4 weeks.   . Cataract   . Chicken pox   . Chronic systolic CHF (congestive heart failure) (HCC)    a. mixed ICM & NICM; b. EF 20-25% by echo 07/2012, mid-dist 2/3 of LV sev HK/AK, mild MR. echo 10/2012: EF 30-35%, sev HK ant-septal & inf walls, GR1DD, mild  MR, PASP 33. c. echo 02/2013: EF 30%, GR1DD, mild MR. echo 04/2014: EF 30%, Septal-lat dyssynchrony, global HK, inf AK, GR1DD, mild MR. d. echo 10/2014: EF50-55%, WM nl, GR1DD, septal mild paradox. e. echo 02/2015: EF 50-55%, wm   . Depression   . Heart attack ([place])   . Heel ulcer ([place]) 04/27/2015  . History of blood transfusion ~ 2011   "plasma; had neuropathy; couldn't walk"  . Hypertension   . LBBB (left bundle branch block)   . Neuromuscular disorder ([place])   . Neuropathy 2011  . Obesity, unspecified   . OSA on CPAP    Moderate with AHI 23/hr and now on CPAP at 16cm H2O.  Her DME is [facility]  . Pure hypercholesterolemia   . Type II diabetes mellitus ([place])   . Unspecified vitamin D deficiency     Medications:  Medications Prior to Admission  Medication Sig Dispense Refill Last Dose  . AMBULATORY NON FORMULARY MEDICATION 1 Units by Other route once. Medication Name: foot brace 1 Units 0 Taking  .  Calcium Carb-Cholecalciferol (CALCIUM 600 + D PO) Take 1 tablet by mouth 2 (two) times daily.   09/28/2018 at am  . citalopram (CELEXA) 20 MG tablet Take 1 tablet (20 mg total) by mouth daily. 90 tablet 1 09/28/2018 at am  . clopidogrel (PLAVIX) 75 MG tablet Take 1 tablet (75 mg total) by mouth daily. (Patient taking differently: Take 75 mg by mouth at bedtime. ) 90 tablet 3 09/26/2018 at pm  . ferrous sulfate 325 (65 FE) MG tablet Take 325 mg by mouth daily.   09/28/2018 at am  . glimepiride (AMARYL) 4 MG tablet Take 1 tablet (4 mg total) by mouth 2 (two) times daily with a meal. 180 tablet 3 09/28/2018 at am  . glucose blood (ONE TOUCH ULTRA TEST) test strip Check sugar three times daily dx: DMII uncontrolled insulin dependent with complications 564 each 11 Taking  . Insulin Glargine (LANTUS) 100 UNIT/ML Solostar Pen Inject 30 Units into the skin at bedtime. 15 mL 11 09/26/2018 at pm  . Insulin Pen Needle (B-D ULTRAFINE III SHORT PEN) 31G X 8 MM MISC USE AS DIRECTED WITH LANTUS; dx: DMII uncontrolled,  insulin dependent, with complications 332 each 11 Taking  . losartan (COZAAR) 25 MG tablet Take 25 mg by mouth at bedtime.   09/26/2018 at pm  . omega-3 acid ethyl esters (LOVAZA) 1 g capsule Take 1 g by mouth 2 (two) times daily.   09/28/2018 at am  . torsemide (DEMADEX) 20 MG tablet Take 1 tablet (20 mg total) by mouth daily. (Patient taking differently: Take 20 mg by mouth at bedtime. ) 90 tablet 1 09/26/2018 at pm  . traMADol (ULTRAM) 50 MG tablet Take 1 tablet (50 mg total) by mouth every 6 (six) hours as needed. for pain (Patient taking differently: Take 50 mg by mouth every 6 (six) hours as needed for moderate pain. ) 120 tablet 5 unknown at unknown  . Dulaglutide (TRULICITY) 9.51 OA/4.1YS SOPN Inject 0.75 mg into the skin once a week. (Patient not taking: Reported on 09/28/2018) 2 mL 5 Not Taking at Unknown time  . gabapentin (NEURONTIN) 600 MG tablet Take 1 tablet (600 mg total) by mouth 3 (three) times daily. (Patient not taking: Reported on 09/28/2018) 270 tablet 1 Not Taking at Unknown time  . rosuvastatin (CRESTOR) 40 MG tablet TAKE 1 TABLET BY MOUTH ONCE DAILY (Patient not taking: Reported on 09/28/2018) 90 tablet 3 Not Taking at Unknown time    Assessment: Pharmacy consulted to dose heparin in this 62 year old female with ACS/NSTEMI.  Pt received lovenox 40 mg SQ on 12/23 @ 20:00 and was given an additional 85mg  Lovenox for a total of 125mg . CrCl = 43.6 m/min  Heparin Course: 12/24 AM initiation: 1400 units/hr 12/24 1451 HL 0.60:   Goal of Therapy:  Heparin level 0.3-0.7 units/ml Monitor platelets by anticoagulation protocol: Yes   Plan:  Heparin level is therapeutic Will draw next heparin level in another 6 hrs Will check HL and CBC daily.   Dallie Piles, PharmD 09/29/2018,3:41 PM

## 2018-09-29 NOTE — Progress Notes (Signed)
Pharmacy Antibiotic Note  [name] is a 62 y.o. female admitted on 09/28/2018 with MSSA bacteremia. Patient with past medical history significant for diabetes mellitus, coronary artery disease, cardiomyopathy, and AICD. Source of infection is likely second toe on left foot. Patient also has urine culture positive for Ecoli. Pharmacy has been consulted for cefazolin dosing.  Plan: Due to worsening renal function, will continue cefazolin 1g IV Q12hr. Will watch renal function daily and adjust as warranted.   Height: 5\' 7"  (170.2 cm) Weight: 275 lb 5.7 oz (124.9 kg) IBW/kg (Calculated) : 61.6  Temp (24hrs), Avg:98.5 F (36.9 C), Min:97.7 F (36.5 C), Max:100.2 F (37.9 C)  Recent Labs  Lab 09/28/18 1202 09/28/18 1213 09/28/18 1421 09/29/18 0217  WBC  --  10.9*  --  8.8  CREATININE  --  1.79*  --  2.40*  LATICACIDVEN 2.93*  --  2.56*  --     Estimated Creatinine Clearance: 32.5 mL/min (A) (by C-G formula based on SCr of 2.4 mg/dL (H)).    No Known Allergies  Antimicrobials this admission: Cefepime 12/23 >> 12/24 Metronidazole 12/23 >> 12/24  Vancomycin 12/23 >> 12/24  Cefazolin 12/24 >>   Dose adjustments this admission: N/A  Microbiology results: 12/23 BCx: MSSA 12/24 BCx:  12/23 UCx: Lucianne Muss  12/23 MRSA PCR: negative   Thank you for allowing pharmacy to be a part of this patient's care.  Simpson,Michael L 09/29/2018 3:06 PM

## 2018-09-29 NOTE — Progress Notes (Signed)
Consult received. I reviewed the patient's chart. She presented with MSAA bactremia and NSTEMI.  I reviewed echo. EF is mildly reduced with no clear vegetations. However, very suboptimal study.   Recommend: Continue Aspirin, Heparin and antibiotics per ID.  Will plan on doing TEE once improved.  The patient might require cardiac cath once improved.   Full consult to follow.

## 2018-09-29 NOTE — Progress Notes (Signed)
Lab called to notify of critical troponin of 5.20.  Notified NP.  NP ordered EKG

## 2018-09-29 NOTE — Consult Note (Signed)
NAME: [name]  DOB: October 24, 1955  MRN: [id number]  Date/Time: 09/29/2018 12:03 PM Dr. Alva Garnet Subjective:  REASON FOR CONSULT: Staph aureus bacteremia ? [name] is a 62 y.o. female with a history of diabetes mellitus, coronary artery disease, cardiomyopathy, AICD, is admitted with altered mental status , vomiting and diarrhea of 1 day duration. No history available from patient. Spoke to the husband.  Chart reviewed. As per the husband patient was okay last week.  She attended a funeral on Thursday and was staying with her mom from Tuesday to Thursday evening.  On Friday she was feeling a little unwell but managed to cooking and baking.  On Sunday she vomited once.  She did not have any fever or chills.  As per husband her sugar was a little high.  On Monday morning she was found to be confused.  She was also having diarrhea and vomiting.  The family managed to give her morning medications.  As she was not getting better and was not making any sense EMS was called and and was brought to the hospital. As per her husband she did not complaining of any pain in her legs, shortness of breath or cough or fever or headache.  As per the husband the second toe  nail had separated a few days ago. No travel history. No recent antibiotic use. In the ED her temperature was 103.6, blood pressure 126.95, heart rate 114, respiratory rate 28.  WBC was 10.9 Creatinine was 1.79 Blood cultures were sent and she was started on vancomycin and and cefepime and Flagyl. Left second toe infection was noted. She was admitted to the ICU for observation. Her urine output was 80 cc overnight.  Medical history Cardiomyopathy Coronary artery disease status post stenting of LAD in 2013 AICD placement in September 2015 Infection of the right second toe leading to partial amputation of the toe History of depression Peripheral neuropathy History of blood transfusion Hypertension OSA on  CPAP Hypercholesterolemia Peripheral arterial disease  Past surgical history Partial amputation of the right second toe September 2016 Laminectomy AICD biventricular implantable cardioverter defibrillator placed September 2015 Bilateral oophorectomy Coronary angioplasty with stent placement for the LAD I&D of abscess in the groin Peripheral vascular catheterization Vaginal hysterectomy Tubal ligation   Social history Lives with her husband Non-smoker No alcohol No illegal drug use Has 2 dogs at home   Family History  Problem Relation Age of Onset  . Diabetes Mother   . Hypertension Mother   . Arthritis Mother        knees, lumbar DDD, cervical DDD  . Cancer Father        prostate,skin,lymphoma.  . Cancer Brother 57       bladder cancer; non-smoker  . Diabetes Maternal Grandmother   . Heart disease Maternal Grandmother   . COPD Maternal Grandmother   . Diabetes Paternal Grandmother   . Obesity Brother   . Diabetes Son   . Hypertension Son   . Cancer Maternal Grandfather   . Diabetes Paternal Grandfather    No Known Allergies  ?Home meds Calcium carbonate with vitamin D Carvedilol Citalopram Clopidogrel Dulaglutide Gabapentin Glimepiride Lantus Losartan Omega-3 Rosuvastatin Torsemide Tramadol   Abtx:  Anti-infectives (From admission, onward)   Start     Dose/Rate Route Frequency Ordered Stop   09/29/18 1000  ceFAZolin (ANCEF) IVPB 2g/100 mL premix     2 g 200 mL/hr over 30 Minutes Intravenous Every 8 hours 09/29/18 0921     12 /24/19  0000  ceFEPIme (MAXIPIME) 2 g in sodium chloride 0.9 % 100 mL IVPB  Status:  Discontinued     2 g 200 mL/hr over 30 Minutes Intravenous Every 12 hours 09/28/18 1318 09/29/18 0919   09/28/18 1830  vancomycin (VANCOCIN) 1,250 mg in sodium chloride 0.9 % 250 mL IVPB     1,250 mg 166.7 mL/hr over 90 Minutes Intravenous Every 18 hours 09/28/18 1318     09/28/18 1200  ceFEPIme (MAXIPIME) 2 g in sodium chloride 0.9 % 100 mL  IVPB     2 g 200 mL/hr over 30 Minutes Intravenous  Once 09/28/18 1156 09/28/18 1301   09/28/18 1200  metroNIDAZOLE (FLAGYL) IVPB 500 mg  Status:  Discontinued     500 mg 100 mL/hr over 60 Minutes Intravenous Every 8 hours 09/28/18 1156 09/29/18 0919   09/28/18 1200  vancomycin (VANCOCIN) IVPB 1000 mg/200 mL premix     1,000 mg 200 mL/hr over 60 Minutes Intravenous  Once 09/28/18 1156 09/28/18 1339      REVIEW OF SYSTEMS:  Not available ? Objective:  VITALS:  BP (!) 113/53   Pulse 86   Temp 99.3 F (37.4 C) (Oral)   Resp (!) 21   Ht 5\' 7"  (1.702 m)   Wt 124.9 kg   SpO2 98%   BMI 43.13 kg/m  PHYSICAL EXAM:  General:, Awake but confused, to person, knows her husband's name Head: Normocephalic, without obvious abnormality, atraumatic. Eyes: Conjunctivae clear, anicteric sclerae. Pupils are equal ENT Nares normal. No drainage or sinus tenderness. Cavity tongue some erythema with whitish patches Neck: Supple, symmetrical, no adenopathy, thyroid: non tender no carotid bruit and no JVD. Back: No CVA tenderness. Lungs: B/L air entry  heart: S1-S2, tachycardia Site of AICD clean with no evidence of erythema or tenderness Abdomen: Soft, non-tender,not distended. Bowel sounds normal. No masses lapscar Extremities: Left foot cold.  Unable to feel the dorsalis pulse on palpation.  But with Doppler feeble pulses noted. Second toe of the left foot is ischemic Swollen.  Absent nail.  No tenderness.  skin: No rashes or lesions. Or bruising Lymph: Cervical, supraclavicular normal. Neurologic: Grossly non-focal Pertinent Labs Lab Results CBC CBC Latest Ref Rng & Units 09/29/2018 09/28/2018 04/15/2018  WBC 4.0 - 10.5 K/uL 8.8 10.9(H) 8.2  Hemoglobin 12.0 - 15.0 g/dL 11.3(L) 11.8(L) 12.3  Hematocrit 36.0 - 46.0 % 33.8(L) 35.2(L) 38.0  Platelets 150 - 400 K/uL 62(L) 81(L) 178    CMP Latest Ref Rng & Units 09/29/2018 09/28/2018 07/20/2018  Glucose 70 - 99 mg/dL 325(H) 390(H) 64(L)   BUN 8 - 23 mg/dL 58(H) 49(H) 43(H)  Creatinine 0.44 - 1.00 mg/dL 2.40(H) 1.79(H) 2.00(H)  Sodium 135 - 145 mmol/L 136 131(L) 140  Potassium 3.5 - 5.1 mmol/L 3.5 3.4(L) 4.5  Chloride 98 - 111 mmol/L 105 96(L) 96  CO2 22 - 32 mmol/L 23 22 24   Calcium 8.9 - 10.3 mg/dL 7.9(L) 8.2(L) 9.1  Total Protein 6.5 - 8.1 g/dL 6.0(L) 6.6 7.1  Total Bilirubin 0.3 - 1.2 mg/dL 0.8 1.3(H) 0.4  Alkaline Phos 38 - 126 U/L 40 51 79  AST 15 - 41 U/L 103(H) 40 18  ALT 0 - 44 U/L 38 22 15      Microbiology: Recent Results (from the past 240 hour(s))  Blood Culture (routine x 2)     Status: None (Preliminary result)   Collection Time: 09/28/18 12:02 PM  Result Value Ref Range Status   Specimen Description   Final  BLOOD RIGHT ANTECUBITAL Performed at [facility], [address] [place], [place] [address]    Special Requests   Final    BOTTLES DRAWN AEROBIC AND ANAEROBIC Blood Culture results may not be optimal due to an excessive volume of blood received in culture bottles Performed at [facility], [address], [place], [place] [address]    Culture  Setup Time   Final    GRAM POSITIVE COCCI IN BOTH AEROBIC AND ANAEROBIC BOTTLES CRITICAL RESULT CALLED TO, READ BACK BY AND VERIFIED WITH: JASON ROBBINS @2134  09/28/18 AKT Performed at [facility], [place] [address], [place], [place] [address]    Culture GRAM POSITIVE COCCI  Final   Report Status PENDING  Incomplete  Urine culture     Status: Abnormal (Preliminary result)   Collection Time: 09/28/18 12:02 PM  Result Value Ref Range Status   Specimen Description   Final    URINE, RANDOM Performed at [facility], [address], [place], [place] [address]    Special Requests   Final    NONE Performed at [facility], [address] [place], [place] [address]    Culture >=100,000 COLONIES/mL GRAM NEGATIVE RODS (A)  Final   Report Status PENDING  Incomplete  Blood Culture ID Panel (Reflexed)      Status: Abnormal   Collection Time: 09/28/18 12:02 PM  Result Value Ref Range Status   Enterococcus species NOT DETECTED NOT DETECTED Final   Listeria monocytogenes NOT DETECTED NOT DETECTED Final   Staphylococcus species DETECTED (A) NOT DETECTED Final    Comment: CRITICAL RESULT CALLED TO, READ BACK BY AND VERIFIED WITH: JASON ROBBINS @2134  09/28/18 AKT    Staphylococcus aureus (BCID) DETECTED (A) NOT DETECTED Final    Comment: Methicillin (oxacillin) susceptible Staphylococcus aureus (MSSA). Preferred therapy is anti staphylococcal beta lactam antibiotic (Cefazolin or Nafcillin), unless clinically contraindicated. CRITICAL RESULT CALLED TO, READ BACK BY AND VERIFIED WITH: JASON ROBBINS @2134  09/28/18 AKT    Methicillin resistance NOT DETECTED NOT DETECTED Final   Streptococcus species NOT DETECTED NOT DETECTED Final   Streptococcus agalactiae NOT DETECTED NOT DETECTED Final   Streptococcus pneumoniae NOT DETECTED NOT DETECTED Final   Streptococcus pyogenes NOT DETECTED NOT DETECTED Final   Acinetobacter baumannii NOT DETECTED NOT DETECTED Final   Enterobacteriaceae species NOT DETECTED NOT DETECTED Final   Enterobacter cloacae complex NOT DETECTED NOT DETECTED Final   Escherichia coli NOT DETECTED NOT DETECTED Final   Klebsiella oxytoca NOT DETECTED NOT DETECTED Final   Klebsiella pneumoniae NOT DETECTED NOT DETECTED Final   Proteus species NOT DETECTED NOT DETECTED Final   Serratia marcescens NOT DETECTED NOT DETECTED Final   Haemophilus influenzae NOT DETECTED NOT DETECTED Final   Neisseria meningitidis NOT DETECTED NOT DETECTED Final   Pseudomonas aeruginosa NOT DETECTED NOT DETECTED Final   Candida albicans NOT DETECTED NOT DETECTED Final   Candida glabrata NOT DETECTED NOT DETECTED Final   Candida krusei NOT DETECTED NOT DETECTED Final   Candida parapsilosis NOT DETECTED NOT DETECTED Final   Candida tropicalis NOT DETECTED NOT DETECTED Final    Comment: Performed at  [facility], [address] [place], [place] [address]  Blood Culture (routine x 2)     Status: None (Preliminary result)   Collection Time: 09/28/18 12:14 PM  Result Value Ref Range Status   Specimen Description   Final    BLOOD RIGHT ANTECUBITAL Performed at [facility], [address] [place], [place] [address]  Special Requests   Final    BOTTLES DRAWN AEROBIC AND ANAEROBIC Blood Culture results may not be optimal due to an excessive volume of blood received in culture bottles Performed at [facility], [address] [place], [place] [address]    Culture  Setup Time   Final    IN BOTH AEROBIC AND ANAEROBIC BOTTLES GRAM POSITIVE COCCI CRITICAL VALUE NOTED.  VALUE IS CONSISTENT WITH PREVIOUSLY REPORTED AND CALLED VALUE. Performed at [facility], [place] [address] [place], [place] [address]    Culture GRAM POSITIVE COCCI  Final   Report Status PENDING  Incomplete  MRSA PCR Screening     Status: None   Collection Time: 09/28/18  5:35 PM  Result Value Ref Range Status   MRSA by PCR NEGATIVE NEGATIVE Final    Comment:        The GeneXpert MRSA Assay (FDA approved for NASAL specimens only), is one component of a comprehensive MRSA colonization surveillance program. It is not intended to diagnose MRSA infection nor to guide or monitor treatment for MRSA infections. Performed at [facility], [address] [place], [place] [address]    IMAGING RESULTS: ?Chest x-ray from 09/28/2018 did not show any congestive heart failure 09/29/2018 shows bilateral congestion   Impression/Recommendation  a 62 y.o. female with a history of diabetes mellitus, coronary artery disease, cardiomyopathy, AICD, is admitted with altered mental status , vomiting and diarrhea of 1 day duration. ? ?Staph aureus bacteremia.  Likely source second toe left foot. With AICD in place concern for vegetation of the wire or valvular vegetation.  Got 2D echo  may need TEE. Repeat blood cultures until clear of bacteremia. As it is MSSA we can continue with cefazolin and stop the vancomycin.  Encephalopathy.  due to infection,and metabolic Left foot second toe ischemia with likely infection.  Observe the toe closely.  May need surgical intervention.  Seen by podiatrist who did not think that she had any infection yesterday.  Diabetes mellitus uncontrolled  AKI on CKD.  Combination of factors.  Infection, hypovolemia. She is oliguric.    Increase in troponins and chest x-ray showing features of congestive heart failure.  Awaiting cardiology.    Thrombocytopenia likely related to infection  Coronary artery disease with history of DES to LAD in 2013.  Is on Plavix.  Now on aspirin.   Gram-negative rods in the urine.  Could be contamination as she has diarrhea.  Will not expand antibiotic coverage currently.  ? ___________________________________________________ Discussed with husband, her nurse and Dr. Alva Garnet

## 2018-09-29 NOTE — Progress Notes (Signed)
ANTICOAGULATION CONSULT NOTE   Pharmacy Consult for Heparin  Indication: chest pain/ACS  Patient Measurements: Height: 5\' 7"  (170.2 cm) Weight: 275 lb 5.7 oz (124.9 kg) IBW/kg (Calculated) : 61.6 Heparin Dosing Weight:  101 kg   Vital Signs: Temp: 98.2 F (36.8 C) (12/24 2000) Temp Source: Oral (12/24 2000) BP: 135/60 (12/24 2000) Pulse Rate: 73 (12/24 2000)  Labs: Recent Labs    09/28/18 1213 09/28/18 1725 09/28/18 1902 09/28/18 2030 09/29/18 0217 09/29/18 1451 09/29/18 2123  HGB 11.8*  --   --   --  11.3*  --   --   HCT 35.2*  --   --   --  33.8*  --   --   PLT 81*  --   --   --  62*  --   --   APTT  --   --   --  43*  --   --   --   LABPROT  --   --   --  18.1* 17.5*  --   --   INR  --   --   --  1.52 1.45  --   --   HEPARINUNFRC  --   --   --   --   --  0.60 0.63  CREATININE 1.79*  --   --   --  2.40* 2.24*  --   TROPONINI 0.35* 2.89* 5.20*  --  24.26*  --   --     Estimated Creatinine Clearance: 34.8 mL/min (A) (by C-G formula based on SCr of 2.24 mg/dL (H)).   Medical History: Past Medical History:  Diagnosis Date  . Acute myocardial infarction, subendocardial infarction, initial episode of care ([place]) 07/21/2012  . Acute osteomyelitis involving ankle and foot ([place]) 02/06/2015  . Acute systolic heart failure ([place]) 07/21/2012   New onset 07/19/12; admission to [facility] ED. Elevated Troponins.  S/p 2D-echo with EF 20-25%.  S/p cardiac catheterization with stenting LAD.  Repeat 2D-echo 10/2011 with improved EF of 35%.   . Anemia   . Automatic implantable cardioverter-defibrillator in situ    a. MDT CRT-D 06/2014, SN: [id number]    . CAD (coronary artery disease)    a. cardiac cath 101/04/2012: PCI/DES to chronically occluded mLAD, consideration PCI to diag branch in 4 weeks.   . Cataract   . Chicken pox   . Chronic systolic CHF (congestive heart failure) (HCC)    a. mixed ICM & NICM; b. EF 20-25% by echo 07/2012, mid-dist 2/3 of LV sev HK/AK, mild MR. echo  10/2012: EF 30-35%, sev HK ant-septal & inf walls, GR1DD, mild MR, PASP 33. c. echo 02/2013: EF 30%, GR1DD, mild MR. echo 04/2014: EF 30%, Septal-lat dyssynchrony, global HK, inf AK, GR1DD, mild MR. d. echo 10/2014: EF50-55%, WM nl, GR1DD, septal mild paradox. e. echo 02/2015: EF 50-55%, wm   . Depression   . Heart attack ([place])   . Heel ulcer ([place]) 04/27/2015  . History of blood transfusion ~ 2011   "plasma; had neuropathy; couldn't walk"  . Hypertension   . LBBB (left bundle branch block)   . Neuromuscular disorder ([place])   . Neuropathy 2011  . Obesity, unspecified   . OSA on CPAP    Moderate with AHI 23/hr and now on CPAP at 16cm H2O.  Her DME is [facility]  . Pure hypercholesterolemia   . Type II diabetes mellitus ([place])   . Unspecified vitamin D deficiency     Medications:  Medications Prior to Admission  Medication Sig Dispense Refill Last Dose  . AMBULATORY NON FORMULARY MEDICATION 1 Units by Other route once. Medication Name: foot brace 1 Units 0 Taking  . Calcium Carb-Cholecalciferol (CALCIUM 600 + D PO) Take 1 tablet by mouth 2 (two) times daily.   09/28/2018 at am  . citalopram (CELEXA) 20 MG tablet Take 1 tablet (20 mg total) by mouth daily. 90 tablet 1 09/28/2018 at am  . clopidogrel (PLAVIX) 75 MG tablet Take 1 tablet (75 mg total) by mouth daily. (Patient taking differently: Take 75 mg by mouth at bedtime. ) 90 tablet 3 09/26/2018 at pm  . ferrous sulfate 325 (65 FE) MG tablet Take 325 mg by mouth daily.   09/28/2018 at am  . glimepiride (AMARYL) 4 MG tablet Take 1 tablet (4 mg total) by mouth 2 (two) times daily with a meal. 180 tablet 3 09/28/2018 at am  . glucose blood (ONE TOUCH ULTRA TEST) test strip Check sugar three times daily dx: DMII uncontrolled insulin dependent with complications 161 each 11 Taking  . Insulin Glargine (LANTUS) 100 UNIT/ML Solostar Pen Inject 30 Units into the skin at bedtime. 15 mL 11 09/26/2018 at pm  . Insulin Pen Needle (B-D ULTRAFINE III SHORT PEN) 31G X  8 MM MISC USE AS DIRECTED WITH LANTUS; dx: DMII uncontrolled, insulin dependent, with complications 096 each 11 Taking  . losartan (COZAAR) 25 MG tablet Take 25 mg by mouth at bedtime.   09/26/2018 at pm  . omega-3 acid ethyl esters (LOVAZA) 1 g capsule Take 1 g by mouth 2 (two) times daily.   09/28/2018 at am  . torsemide (DEMADEX) 20 MG tablet Take 1 tablet (20 mg total) by mouth daily. (Patient taking differently: Take 20 mg by mouth at bedtime. ) 90 tablet 1 09/26/2018 at pm  . traMADol (ULTRAM) 50 MG tablet Take 1 tablet (50 mg total) by mouth every 6 (six) hours as needed. for pain (Patient taking differently: Take 50 mg by mouth every 6 (six) hours as needed for moderate pain. ) 120 tablet 5 unknown at unknown  . Dulaglutide (TRULICITY) 0.45 WU/9.8JX SOPN Inject 0.75 mg into the skin once a week. (Patient not taking: Reported on 09/28/2018) 2 mL 5 Not Taking at Unknown time  . gabapentin (NEURONTIN) 600 MG tablet Take 1 tablet (600 mg total) by mouth 3 (three) times daily. (Patient not taking: Reported on 09/28/2018) 270 tablet 1 Not Taking at Unknown time  . rosuvastatin (CRESTOR) 40 MG tablet TAKE 1 TABLET BY MOUTH ONCE DAILY (Patient not taking: Reported on 09/28/2018) 90 tablet 3 Not Taking at Unknown time    Assessment: Pharmacy consulted to dose heparin in this 62 year old female with ACS/NSTEMI.  Pt received lovenox 40 mg SQ on 12/23 @ 20:00 and was given an additional 85mg  Lovenox for a total of 125mg . CrCl = 43.6 m/min  Heparin Course: 12/24 AM initiation: 1400 units/hr 12/24 1451 HL 0.60:   Goal of Therapy:  Heparin level 0.3-0.7 units/ml Monitor platelets by anticoagulation protocol: Yes   Plan:  Heparin level is therapeutic Will draw next heparin level in another 6 hrs Will check HL and CBC daily.   12/24:  HL @ 2123 = 0.63 Will continue pt on current rate and recheck HL on 12/25 with AM labs.   Rylee Nuzum D, PharmD 09/29/2018,10:08 PM

## 2018-09-29 NOTE — Progress Notes (Signed)
[place] [facility] [place] NAME: [name]    MR#:  [id number]  DATE OF BIRTH:  March 25, 1956  SUBJECTIVE:  Came in with AMS Now better No family  REVIEW OF SYSTEMS:   Review of Systems  Constitutional: Negative for chills, fever and weight loss.  HENT: Negative for ear discharge, ear pain and nosebleeds.   Eyes: Negative for blurred vision, pain and discharge.  Respiratory: Negative for sputum production, shortness of breath, wheezing and stridor.   Cardiovascular: Negative for chest pain, palpitations, orthopnea and PND.  Gastrointestinal: Negative for abdominal pain, diarrhea, nausea and vomiting.  Genitourinary: Negative for frequency and urgency.  Musculoskeletal: Negative for back pain and joint pain.  Neurological: Positive for weakness. Negative for sensory change, speech change and focal weakness.  Psychiatric/Behavioral: Negative for depression and hallucinations. The patient is not nervous/anxious.    Tolerating Diet: Tolerating PT:   DRUG ALLERGIES:  No Known Allergies  VITALS:  Blood pressure 135/60, pulse 73, temperature 98.2 F (36.8 C), temperature source Oral, resp. rate (!) 22, height 5' 7"  (1.702 m), weight 124.9 kg, SpO2 98 %.  PHYSICAL EXAMINATION:   Physical Exam  GENERAL:  62 y.o.-year-old patient lying in the bed with no acute distress. obese EYES: Pupils equal, round, reactive to light and accommodation. No scleral icterus. Extraocular muscles intact.  HEENT: Head atraumatic, normocephalic. Oropharynx and nasopharynx clear.  NECK:  Supple, no jugular venous distention. No thyroid enlargement, no tenderness.  LUNGS:decreased breath sounds bilaterally, no wheezing, rales, rhonchi. No use of accessory muscles of respiration.  CARDIOVASCULAR: S1, S2 normal. No murmurs, rubs, or gallops.  ABDOMEN: Soft, nontender, nondistended. Bowel sounds present. No organomegaly or mass.  EXTREMITIES: No cyanosis,  clubbing or edema b/l.  Left 2nd toe ulceration/ NEUROLOGIC: Cranial nerves II through XII are intact. No focal Motor or sensory deficits b/l.   PSYCHIATRIC:  patient is alert and oriented x 3.  SKIN: No obvious rash, lesion, or ulcer.   LABORATORY PANEL:  CBC Recent Labs  Lab 09/29/18 0217  WBC 8.8  HGB 11.3*  HCT 33.8*  PLT 62*    Chemistries  Recent Labs  Lab 09/29/18 0217 09/29/18 1451  NA 136 136  K 3.5 3.3*  CL 105 104  CO2 23 21*  GLUCOSE 325* 182*  BUN 58* 60*  CREATININE 2.40* 2.24*  CALCIUM 7.9* 7.9*  MG 1.6*  --   AST 103*  --   ALT 38  --   ALKPHOS 40  --   BILITOT 0.8  --    Cardiac Enzymes Recent Labs  Lab 09/29/18 0217  TROPONINI 24.26*   RADIOLOGY:  Dg Chest Port 1 View  Result Date: 09/29/2018 CLINICAL DATA:  Acute onset of respiratory failure. EXAM: PORTABLE CHEST 1 VIEW COMPARISON:  Chest radiograph performed 09/28/2018 FINDINGS: There is mild elevation of the left hemidiaphragm. Vascular congestion is noted. Right midlung and right basilar airspace opacity may reflect pneumonia or asymmetric pulmonary edema. No definite pleural effusion or pneumothorax is seen. The cardiomediastinal silhouette is normal in size. A pacemaker/AICD is noted overlying the left chest wall, with leads ending overlying the right atrium, right ventricle and coronary sinus. No acute osseous abnormalities are identified. IMPRESSION: Mild elevation of the left hemidiaphragm. Vascular congestion noted. Right midlung and right basilar airspace opacity may reflect pneumonia or asymmetric pulmonary edema. Electronically Signed   By: Garald Balding M.D.   On: 09/29/2018 05:26   Dg Chest [facility] 1 [address]  Result Date: 09/28/2018 CLINICAL DATA:  Pt with altered mental status and elevated temp emesis x 1 day Pt not able to get OOB the last few days Infection to left second toe noted. Hx of diabetes, hypertension, heart attack, CHF, CAD, defibrillator. Non smokerams. 2nd toe swollen EXAM:  PORTABLE CHEST 1 VIEW COMPARISON:  02/24/2018 FINDINGS: LEFT-sided pacemaker overlies normal cardiac silhouette. Low lung volumes. No effusion, infiltrate pneumothorax. No acute osseous abnormality. IMPRESSION: Low lung volumes.  No acute findings Electronically Signed   By: Suzy Bouchard M.D.   On: 09/28/2018 12:57   Dg Foot Complete Left  Result Date: 09/28/2018 CLINICAL DATA:  Second toe swelling.  Altered mental status. EXAM: LEFT FOOT - COMPLETE 3+ VIEW COMPARISON:  05/03/2016. FINDINGS: There is diffuse soft tissue swelling involving the forefoot. Periarticular bony resorption at the second PIP joint is identified and may be slightly progressive when compared with 05/03/2016. No acute fractures or dislocations identified. Moderate degenerative changes within the midfoot identified. Plantar heel spur noted. IMPRESSION: 1. Soft tissue swelling and periarticular bony resorption identified at the second PIP joint. Findings are compatible with either inflammatory or infectious arthropathy. Electronically Signed   By: Kerby Moors M.D.   On: 09/28/2018 13:03   ASSESSMENT AND PLAN:  [name]  is a 62 y.o. female with a known history of coronary artery disease, diabetes mellitus, status post pacemaker defibrillation and multiple other medical problems is brought into the ED with altered mental status.  Patient has fever associated with nausea vomiting and diarrhea for the past 24 hours according to the daughter at bedside.  Temperature was 102 F and white blood cell count is elevated and the troponins are elevated.  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 and septic provide IV fluids  Monitor for symptoms and signs of fluid overload   CODE STATUS: full DVT Prophylaxis: *heparin gtt*  TOTAL TIME TAKING CARE OF THIS PATIENT: *30* minutes.  >50% time spent on counselling and coordination of care  POSSIBLE D/C IN *few* DAYS, DEPENDING ON CLINICAL CONDITION.  Note: This dictation was prepared with Dragon dictation along with smaller phrase technology. Any transcriptional errors that result from this process are unintentional.  Fritzi Mandes M.D on 09/29/2018 at 9:43 PM  Between 7am to 6pm - Pager - [phone]  After 6pm go to [email or web address] - password [email or web address] [facility]  Office  [phone]  CC: Primary care physician; [place] Guys, MDPatient ID: [name], female   DOB: January 06, 1956, 62 y.o.   MRN: [id number]

## 2018-09-29 NOTE — Progress Notes (Signed)
*  PRELIMINARY RESULTS* Echocardiogram 2D Echocardiogram has been performed.  [name] 09/29/2018, 12:01 PM

## 2018-09-29 NOTE — Consult Note (Signed)
PULMONARY/CCM CONSULT NOTE  Requesting MD/Service: Inpatient hospitalist service Date of initial consultation: 09/29/2015 Reason for consultation: Severe sepsis, respiratory distress  PT PROFILE: 62 y.o. female never smoker admitted via [facility] ED with 24 hours of N/V/D, AMS, high fever.  Diagnosis of severe sepsis with unclear source.  Possible sources include urine, pneumonia, left second toe cellulitis/osteomyelitis   MAJOR EVENTS/TEST RESULTS: 12/23 admission as documented above.  Admission diagnosis of severe sepsis, unclear source with acute respiratory distress. Admitted to ICU/SDU on BiPAP 12/23 L foot plain Xray: Soft tissue swelling and periarticular bony resorption identified at the second PIP joint. Findings are compatible with either inflammatory or infectious arthropathy 12/23 Podiatry consultation: No further evaluation or intervention recommended 12/24 Trop I 24.26. BNP 1758. Heparin infusion initiated.  12/24 BC positive for staph aureus 12/24 Worsening renal function with oliguria 12/24 Echocardiogram:  12/24 Cardiology consultation:  12/24 ID consultation:   INDWELLING DEVICES::   MICRO DATA: MRSA PCR 12/23 >> negative  Urine 12/23 >> greater than 100k GNRs >>  Blood 12/23 >> 2/2 S aureus GI panel 12/24 >>  C diff PCR 12/24 >>   ANTIMICROBIALS:  Metronidazole 12/23 >> 12/24 Cefepime 12/23 >> 12/24 Vancomycin 12/23 >>  Cefazolin 12/24 >>    SUBJ:  Now off BiPAP and appears comfortable on nasal cannula oxygen.  Remains mildly confused but cognition improved.  No overt distress and no new complaints   Vitals:   09/29/18 0500 09/29/18 0600 09/29/18 0700 09/29/18 0800  BP: (!) 110/58 (!) 113/59 (!) 113/53   Pulse: 86 89 86   Resp:   (!) 21   Temp:    99.3 F (37.4 C)  TempSrc:    Oral  SpO2: 100% 98% 98%   Weight: 124.9 kg     Height: 5\' 7"  (1.702 m)        EXAM:  Gen: No overt distress HEENT: NCAT, sclera white, oropharynx normal Neck: Supple,  JVP not visualized Lungs: Bibasilar crackles Cardiovascular: Regular (paced), no murmur noted Abdomen: Obese, soft, NT, NABS Ext: No clubbing or cyanosis.  Trace symmetric pretibial edema.  Left second toe unchanged Neuro: Cognition improved, CNs intact, no focal deficits. Skin: Limited exam, no lesions noted  DATA:   BMP Latest Ref Rng & Units 09/29/2018 09/28/2018 07/20/2018  Glucose 70 - 99 mg/dL 325(H) 390(H) 64(L)  BUN 8 - 23 mg/dL 58(H) 49(H) 43(H)  Creatinine 0.44 - 1.00 mg/dL 2.40(H) 1.79(H) 2.00(H)  BUN/Creat Ratio 12 - 28 - - 22  Sodium 135 - 145 mmol/L 136 131(L) 140  Potassium 3.5 - 5.1 mmol/L 3.5 3.4(L) 4.5  Chloride 98 - 111 mmol/L 105 96(L) 96  CO2 22 - 32 mmol/L 23 22 24   Calcium 8.9 - 10.3 mg/dL 7.9(L) 8.2(L) 9.1    CBC Latest Ref Rng & Units 09/29/2018 09/28/2018 04/15/2018  WBC 4.0 - 10.5 K/uL 8.8 10.9(H) 8.2  Hemoglobin 12.0 - 15.0 g/dL 11.3(L) 11.8(L) 12.3  Hematocrit 36.0 - 46.0 % 33.8(L) 35.2(L) 38.0  Platelets 150 - 400 K/uL 62(L) 81(L) 178    CXR: Vascular congestion, groundglass opacities in RLL and R midlung consistent with pneumonia versus asymmetric edema  I have personally reviewed all chest radiographs reported above including CXRs and CT chest unless otherwise indicated     IMPRESSION:   1) staph aureus bacteremia with severe sepsis 2) pulmonary infiltrates-asymmetric edema versus pneumonia 3) non-STEMI, history of schema cardiomyopathy with CHF 4) oliguric AKI (baseline creatinine 1.3) 5) severe nausea/vomiting/diarrhea, much improved 6) type 2  diabetes with hyperglycemia, controlled 7) Pyuria with GNR bacteriuria 8) acute moderate thrombocytopenia 9) acute encephalopathy due to sepsis.  Improving   PLAN:  Continue to monitor in ICU Continue BiPAP as needed for ventilatory support Continue supplemental oxygen as needed to maintain SPO2 >90% Follow CXR Continue ICU hemodynamic monitoring Echocardiogram ordered Cardiology  consultation requested Continue heparin infusion Continue to monitor BMET intermittently  Repeat to BMET this afternoon  Consider nephrology consultation Continue to monitor I/Os Correct electrolytes as indicated Monitor temp, WBC count Micro and abx as documented above Track PCT  ID consultation requested DVT px: Full dose heparin Monitor CBC intermittently Transfuse per usual guidelines Monitor platelet count closely while on unfractionated heparin   Will meet with family later today to provide update    CCM time: 40 mins The above time includes time spent in consultation with patient and/or family members and reviewing care plan with admitting service  Merton Border, MD PCCM service Mobile [phone] Pager [phone] 09/29/2018 11:51 AM

## 2018-09-29 NOTE — Progress Notes (Signed)
Lab called with critical troponin value of 24.26.  Hinton Dyer, NP notified

## 2018-09-30 ENCOUNTER — Inpatient Hospital Stay: Payer: PPO

## 2018-09-30 ENCOUNTER — Encounter: Payer: Self-pay | Admitting: General Practice

## 2018-09-30 DIAGNOSIS — Z7982 Long term (current) use of aspirin: Secondary | ICD-10-CM

## 2018-09-30 DIAGNOSIS — I214 Non-ST elevation (NSTEMI) myocardial infarction: Secondary | ICD-10-CM

## 2018-09-30 DIAGNOSIS — Z960 Presence of urogenital implants: Secondary | ICD-10-CM

## 2018-09-30 DIAGNOSIS — Z955 Presence of coronary angioplasty implant and graft: Secondary | ICD-10-CM

## 2018-09-30 LAB — GLUCOSE, CAPILLARY
Glucose-Capillary: 169 mg/dL — ABNORMAL HIGH (ref 70–99)
Glucose-Capillary: 209 mg/dL — ABNORMAL HIGH (ref 70–99)
Glucose-Capillary: 181 mg/dL — ABNORMAL HIGH (ref 70–99)
Glucose-Capillary: 145 mg/dL — ABNORMAL HIGH (ref 70–99)

## 2018-09-30 LAB — COMPREHENSIVE METABOLIC PANEL
ALT: 45 U/L — ABNORMAL HIGH (ref 0–44)
AST: 100 U/L — ABNORMAL HIGH (ref 15–41)
Albumin: 2.6 g/dL — ABNORMAL LOW (ref 3.5–5.0)
Alkaline Phosphatase: 42 U/L (ref 38–126)
Anion gap: 10 (ref 5–15)
BUN: 59 mg/dL — ABNORMAL HIGH (ref 8–23)
CO2: 22 mmol/L (ref 22–32)
Calcium: 8 mg/dL — ABNORMAL LOW (ref 8.9–10.3)
Chloride: 105 mmol/L (ref 98–111)
Creatinine, Ser: 1.75 mg/dL — ABNORMAL HIGH (ref 0.44–1.00)
GFR calc Af Amer: 35 mL/min — ABNORMAL LOW (ref >60)
GFR calc non Af Amer: 30 mL/min — ABNORMAL LOW (ref >60)
Glucose, Bld: 167 mg/dL — ABNORMAL HIGH (ref 70–99)
Potassium: 3.4 mmol/L — ABNORMAL LOW (ref 3.5–5.1)
Sodium: 137 mmol/L (ref 135–145)
Total Bilirubin: 0.6 mg/dL (ref 0.3–1.2)
Total Protein: 5.6 g/dL — ABNORMAL LOW (ref 6.5–8.1)

## 2018-09-30 LAB — URINE CULTURE

## 2018-09-30 LAB — CBC
HCT: 34.3 % — ABNORMAL LOW (ref 36.0–46.0)
Hemoglobin: 11.5 g/dL — ABNORMAL LOW (ref 12.0–15.0)
MCH: 29.8 pg (ref 26.0–34.0)
MCHC: 33.5 g/dL (ref 30.0–36.0)
MCV: 88.9 fL (ref 80.0–100.0)
NRBC: 0 % (ref 0.0–0.2)
Platelets: 53 10*3/uL — ABNORMAL LOW (ref 150–400)
RBC: 3.86 MIL/uL — ABNORMAL LOW (ref 3.87–5.11)
RDW: 13.3 % (ref 11.5–15.5)
WBC: 7.4 10*3/uL (ref 4.0–10.5)

## 2018-09-30 LAB — PROCALCITONIN: Procalcitonin: 28.36 ng/mL

## 2018-09-30 LAB — HEPARIN LEVEL (UNFRACTIONATED): Heparin Unfractionated: 0.41 IU/mL (ref 0.30–0.70)

## 2018-09-30 MED ORDER — POTASSIUM CHLORIDE CRYS ER 20 MEQ PO TBCR
40.0000 meq | EXTENDED_RELEASE_TABLET | Freq: Once | ORAL | Status: AC
  Administered 2018-09-30: 40 meq via ORAL
  Filled 2018-09-30: qty 2

## 2018-09-30 MED ORDER — ROSUVASTATIN CALCIUM 10 MG PO TABS
40.0000 mg | ORAL_TABLET | Freq: Every day | ORAL | Status: DC
  Administered 2018-09-30 – 2018-10-08 (×9): 40 mg via ORAL
  Filled 2018-09-30 (×8): qty 4

## 2018-09-30 MED ORDER — SODIUM CHLORIDE 0.9 % IV SOLN
250.0000 mL | INTRAVENOUS | Status: DC | PRN
  Administered 2018-09-30 – 2018-10-02 (×2): 250 mL via INTRAVENOUS

## 2018-09-30 MED ORDER — PROMETHAZINE HCL 25 MG/ML IJ SOLN
12.5000 mg | Freq: Four times a day (QID) | INTRAMUSCULAR | Status: DC | PRN
  Administered 2018-09-30: 12.5 mg via INTRAVENOUS
  Filled 2018-09-30: qty 1

## 2018-09-30 MED ORDER — CEFAZOLIN SODIUM-DEXTROSE 2-4 GM/100ML-% IV SOLN
2.0000 g | Freq: Two times a day (BID) | INTRAVENOUS | Status: DC
  Administered 2018-09-30: 2 g via INTRAVENOUS
  Filled 2018-09-30 (×3): qty 100

## 2018-09-30 MED ORDER — SODIUM CHLORIDE 0.9% FLUSH: 3.0000 mL | INTRAVENOUS | Status: DC | PRN

## 2018-09-30 MED ORDER — SODIUM CHLORIDE 0.9% FLUSH
3.0000 mL | Freq: Two times a day (BID) | INTRAVENOUS | Status: DC
  Administered 2018-09-30 – 2018-10-07 (×13): 3 mL via INTRAVENOUS

## 2018-09-30 MED ORDER — CARVEDILOL 3.125 MG PO TABS
3.1250 mg | ORAL_TABLET | Freq: Two times a day (BID) | ORAL | Status: DC
  Administered 2018-09-30 – 2018-10-02 (×4): 3.125 mg via ORAL
  Filled 2018-09-30 (×4): qty 1

## 2018-09-30 NOTE — Progress Notes (Signed)
Subjective/Chief Complaint: Patient seen.  Doing some better.  No pain with her toe.   Objective: Vital signs in last 24 hours: Temp:  [97.9 F (36.6 C)-99.4 F (37.4 C)] 98.2 F (36.8 C) (12/25 0800) Pulse Rate:  [63-114] 67 (12/25 0800) Resp:  [14-24] 18 (12/25 0800) BP: (109-156)/(54-90) 124/63 (12/25 0800) SpO2:  [88 %-100 %] 97 % (12/25 0800) Last BM Date: 09/29/18  Intake/Output from previous day: 12/24 0701 - 12/25 0700 In: 924 [P.O.:240; I.V.:284; IV Piggyback:400] Out: 930 [Urine:930] Intake/Output this shift: Total I/O In: 240 [P.O.:240] Out: 200 [Urine:200]  There is some increased erythema along with the ecchymosis and edema in the left second toe today.  Dorsal eschar is noted.  Upon debridement there is a full-thickness ulceration with a heavy amount of purulent and bloody discharge from the joint area.  Some instability is noted on motion of the joint.  Ulcerative area is approximately 6 mm x 3 mm post debridement extending down to the level of bone.  Lab Results:  Recent Labs    09/29/18 0217 09/30/18 0540  WBC 8.8 7.4  HGB 11.3* 11.5*  HCT 33.8* 34.3*  PLT 62* 53*   BMET Recent Labs    09/29/18 1451 09/30/18 0540  NA 136 137  K 3.3* 3.4*  CL 104 105  CO2 21* 22  GLUCOSE 182* 167*  BUN 60* 59*  CREATININE 2.24* 1.75*  CALCIUM 7.9* 8.0*   PT/INR Recent Labs    09/28/18 2030 09/29/18 0217  LABPROT 18.1* 17.5*  INR 1.52 1.45   ABG Recent Labs    09/28/18 1536 09/29/18 0500  PHART 7.40 7.43  HCO3 18.6* 21.9    Studies/Results: Dg Chest Port 1 View  Result Date: 09/30/2018 CLINICAL DATA:  Acute respiratory failure. EXAM: PORTABLE CHEST 1 VIEW COMPARISON:  Multiple recent previous exams. FINDINGS: 0515 hours. Low volume film. No edema. Left upper lobe nodular density is new since 09/28/2018. Interstitial markings are diffusely coarsened with chronic features. Cardiopericardial silhouette is at upper limits of normal for size.  Left pacer/AICD again noted. Telemetry leads overlie the chest. IMPRESSION: New left upper lobe nodular density. Given the relatively rapid interval appearance, this may be external to the patient or confluence of shadows. Close attention this region on follow-up recommended. Otherwise stable. Electronically Signed   By: Misty Stanley M.D.   On: 09/30/2018 07:13   Dg Chest Port 1 View  Result Date: 09/29/2018 CLINICAL DATA:  Acute onset of respiratory failure. EXAM: PORTABLE CHEST 1 VIEW COMPARISON:  Chest radiograph performed 09/28/2018 FINDINGS: There is mild elevation of the left hemidiaphragm. Vascular congestion is noted. Right midlung and right basilar airspace opacity may reflect pneumonia or asymmetric pulmonary edema. No definite pleural effusion or pneumothorax is seen. The cardiomediastinal silhouette is normal in size. A pacemaker/AICD is noted overlying the left chest wall, with leads ending overlying the right atrium, right ventricle and coronary sinus. No acute osseous abnormalities are identified. IMPRESSION: Mild elevation of the left hemidiaphragm. Vascular congestion noted. Right midlung and right basilar airspace opacity may reflect pneumonia or asymmetric pulmonary edema. Electronically Signed   By: Garald Balding M.D.   On: 09/29/2018 05:26   Dg Chest Port 1 View  Result Date: 09/28/2018 CLINICAL DATA:  Pt with altered mental status and elevated temp emesis x 1 day Pt not able to get OOB the last few days Infection to left second toe noted. Hx of diabetes, hypertension, heart attack, CHF, CAD, defibrillator. Non smokerams. 2nd toe  swollen EXAM: PORTABLE CHEST 1 VIEW COMPARISON:  02/24/2018 FINDINGS: LEFT-sided pacemaker overlies normal cardiac silhouette. Low lung volumes. No effusion, infiltrate pneumothorax. No acute osseous abnormality. IMPRESSION: Low lung volumes.  No acute findings Electronically Signed   By: Suzy Bouchard M.D.   On: 09/28/2018 12:57   Dg Foot Complete  Left  Result Date: 09/28/2018 CLINICAL DATA:  Second toe swelling.  Altered mental status. EXAM: LEFT FOOT - COMPLETE 3+ VIEW COMPARISON:  05/03/2016. FINDINGS: There is diffuse soft tissue swelling involving the forefoot. Periarticular bony resorption at the second PIP joint is identified and may be slightly progressive when compared with 05/03/2016. No acute fractures or dislocations identified. Moderate degenerative changes within the midfoot identified. Plantar heel spur noted. IMPRESSION: 1. Soft tissue swelling and periarticular bony resorption identified at the second PIP joint. Findings are compatible with either inflammatory or infectious arthropathy. Electronically Signed   By: Kerby Moors M.D.   On: 09/28/2018 13:03    Anti-infectives: Anti-infectives (From admission, onward)   Start     Dose/Rate Route Frequency Ordered Stop   09/30/18 2200  ceFAZolin (ANCEF) IVPB 2g/100 mL premix     2 g 200 mL/hr over 30 Minutes Intravenous Every 12 hours 09/30/18 1036     09/29/18 2200  ceFAZolin (ANCEF) IVPB 1 g/50 mL premix  Status:  Discontinued     1 g 100 mL/hr over 30 Minutes Intravenous Every 12 hours 09/29/18 1505 09/30/18 1036   09/29/18 1000  ceFAZolin (ANCEF) IVPB 2g/100 mL premix  Status:  Discontinued     2 g 200 mL/hr over 30 Minutes Intravenous Every 8 hours 09/29/18 0921 09/29/18 1505   09/29/18 0000  ceFEPIme (MAXIPIME) 2 g in sodium chloride 0.9 % 100 mL IVPB  Status:  Discontinued     2 g 200 mL/hr over 30 Minutes Intravenous Every 12 hours 09/28/18 1318 09/29/18 0919   09/28/18 1830  vancomycin (VANCOCIN) 1,250 mg in sodium chloride 0.9 % 250 mL IVPB  Status:  Discontinued     1,250 mg 166.7 mL/hr over 90 Minutes Intravenous Every 18 hours 09/28/18 1318 09/29/18 1235   09/28/18 1200  ceFEPIme (MAXIPIME) 2 g in sodium chloride 0.9 % 100 mL IVPB     2 g 200 mL/hr over 30 Minutes Intravenous  Once 09/28/18 1156 09/28/18 1301   09/28/18 1200  metroNIDAZOLE (FLAGYL) IVPB  500 mg  Status:  Discontinued     500 mg 100 mL/hr over 60 Minutes Intravenous Every 8 hours 09/28/18 1156 09/29/18 0919   09/28/18 1200  vancomycin (VANCOCIN) IVPB 1000 mg/200 mL premix     1,000 mg 200 mL/hr over 60 Minutes Intravenous  Once 09/28/18 1156 09/28/18 1339      Assessment/Plan: s/p * No surgery found * Assessment: Osteomyelitis left second toe.   Plan: Excisional debridement of the ulcerative area on the dorsal aspect of the left second toe full-thickness including the deeper subcutaneous tissue sharply using a pair of tissue nippers.  Culture was taken for sensitivities.  Sterile gauze bandage applied.  Discussed with the patient the need for amputation of the toe.  Discussed with critical care and at this point she is not stable enough for surgery but hopefully we will plan for amputation of the left second toe at some point when she is more stable and prior to discharge.  Daily wound care with saline wet-to-dry gauze dressing over the ulcerative area on the second toe.  At this point we will wait to hear from  medicine about when she is stable for amputation.  LOS: 2 days    Durward Fortes 09/30/2018

## 2018-09-30 NOTE — Progress Notes (Signed)
[facility] [place] NAME: [name]    MR#:  [id number]  DATE OF BIRTH:  November 01, 1955  SUBJECTIVE:  Came in with AMS Now better No family  REVIEW OF SYSTEMS:   Review of Systems  Constitutional: Negative for chills, fever and weight loss.  HENT: Negative for ear discharge, ear pain and nosebleeds.   Eyes: Negative for blurred vision, pain and discharge.  Respiratory: Negative for sputum production, shortness of breath, wheezing and stridor.   Cardiovascular: Negative for chest pain, palpitations, orthopnea and PND.  Gastrointestinal: Negative for abdominal pain, diarrhea, nausea and vomiting.  Genitourinary: Negative for frequency and urgency.  Musculoskeletal: Negative for back pain and joint pain.  Neurological: Positive for weakness. Negative for sensory change, speech change and focal weakness.  Psychiatric/Behavioral: Negative for depression and hallucinations. The patient is not nervous/anxious.    Tolerating Diet:yesTolerating PT: pending  DRUG ALLERGIES:  No Known Allergies  VITALS:  Blood pressure (!) 144/66, pulse 77, temperature 97.8 F (36.6 C), temperature source Oral, resp. rate 16, height 5' 7"  (1.702 m), weight 124.9 kg, SpO2 98 %.  PHYSICAL EXAMINATION:   Physical Exam  GENERAL:  62 y.o.-year-old patient lying in the bed with no acute distress. obese EYES: Pupils equal, round, reactive to light and accommodation. No scleral icterus. Extraocular muscles intact.  HEENT: Head atraumatic, normocephalic. Oropharynx and nasopharynx clear.  NECK:  Supple, no jugular venous distention. No thyroid enlargement, no tenderness.  LUNGS:decreased breath sounds bilaterally, no wheezing, rales, rhonchi. No use of accessory muscles of respiration.  CARDIOVASCULAR: S1, S2 normal. No murmurs, rubs, or gallops.  ABDOMEN: Soft, nontender, nondistended. Bowel sounds present. No organomegaly or mass.  EXTREMITIES: No  cyanosis, clubbing or edema b/l.  Left 2nd toe ulceration/ NEUROLOGIC: Cranial nerves II through XII are intact. No focal Motor or sensory deficits b/l.   PSYCHIATRIC:  patient is alert and oriented x 3.  SKIN: No obvious rash, lesion, or ulcer.   LABORATORY PANEL:  CBC Recent Labs  Lab 09/30/18 0540  WBC 7.4  HGB 11.5*  HCT 34.3*  PLT 53*    Chemistries  Recent Labs  Lab 09/29/18 0217  09/30/18 0540  NA 136   < > 137  K 3.5   < > 3.4*  CL 105   < > 105  CO2 23   < > 22  GLUCOSE 325*   < > 167*  BUN 58*   < > 59*  CREATININE 2.40*   < > 1.75*  CALCIUM 7.9*   < > 8.0*  MG 1.6*  --   --   AST 103*  --  100*  ALT 38  --  45*  ALKPHOS 40  --  42  BILITOT 0.8  --  0.6   < > = values in this interval not displayed.   Cardiac Enzymes Recent Labs  Lab 09/29/18 0217  TROPONINI 24.26*   RADIOLOGY:  Dg Chest Port 1 View  Result Date: 09/30/2018 CLINICAL DATA:  Acute respiratory failure. EXAM: PORTABLE CHEST 1 VIEW COMPARISON:  Multiple recent previous exams. FINDINGS: 0515 hours. Low volume film. No edema. Left upper lobe nodular density is new since 09/28/2018. Interstitial markings are diffusely coarsened with chronic features. Cardiopericardial silhouette is at upper limits of normal for size. Left pacer/AICD again noted. Telemetry leads overlie the chest. IMPRESSION: New left upper lobe nodular density. Given the relatively rapid interval appearance, this may be external to the patient or confluence  of shadows. Close attention this region on follow-up recommended. Otherwise stable. Electronically Signed   By: Misty Stanley M.D.   On: 09/30/2018 07:13   Dg Chest Port 1 View  Result Date: 09/29/2018 CLINICAL DATA:  Acute onset of respiratory failure. EXAM: PORTABLE CHEST 1 VIEW COMPARISON:  Chest radiograph performed 09/28/2018 FINDINGS: There is mild elevation of the left hemidiaphragm. Vascular congestion is noted. Right midlung and right basilar airspace opacity may  reflect pneumonia or asymmetric pulmonary edema. No definite pleural effusion or pneumothorax is seen. The cardiomediastinal silhouette is normal in size. A pacemaker/AICD is noted overlying the left chest wall, with leads ending overlying the right atrium, right ventricle and coronary sinus. No acute osseous abnormalities are identified. IMPRESSION: Mild elevation of the left hemidiaphragm. Vascular congestion noted. Right midlung and right basilar airspace opacity may reflect pneumonia or asymmetric pulmonary edema. Electronically Signed   By: Garald Balding M.D.   On: 09/29/2018 05:26   ASSESSMENT AND PLAN:  [name]  is a 62 y.o. female with a known history of coronary artery disease, diabetes mellitus, status post pacemaker defibrillation and multiple other medical problems is brought into the ED with altered mental status.  Patient has fever associated with nausea vomiting and diarrhea for the past 24 hours according to the daughter at bedside.  Temperature was 102 F and white blood cell count is elevated and the troponins are elevated.  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 and septic provide IV fluids  Monitor for symptoms  and signs of fluid overload  being transferred out of ICU CODE STATUS: full DVT Prophylaxis: *heparin gtt*  TOTAL TIME TAKING CARE OF THIS PATIENT: *30* minutes.  >50% time spent on counselling and coordination of care  POSSIBLE D/C IN *few* DAYS, DEPENDING ON CLINICAL CONDITION.  Note: This dictation was prepared with Dragon dictation along with smaller phrase technology. Any transcriptional errors that result from this process are unintentional.  Fritzi Mandes M.D on 09/30/2018 at 12:55 PM  Between 7am to 6pm - Pager - [phone]  After 6pm go to [email or web address] - password [email or web address] [facility]  Office  [phone]  CC: Primary care physician; Rutherford Guys, MDPatient ID: [name], female   DOB: [birth year], 62 y.o.   MRN: [id number]

## 2018-09-30 NOTE — Plan of Care (Signed)
Pt saturation dropped  to 88 and sustained, pt was placed back on 2L/[place], sat  with improvement. Husband at bedside all night.

## 2018-09-30 NOTE — Progress Notes (Signed)
Pt transferring to 2A-Rm 247. Report given to [employer]. Pt diagnosed with sepsis possibly from MSSA of left second toe. A & O X3, flat. Metabolic encephalopathy secondary to sepsis. V-paced, 40-50% EF. TEE scheduled for concern of valvular and AICD vegetation. On 2L [place]. Heart healthy diet. BM today. Negative C. Diff. Foley output 450cc. Necrotic 2nd toe on left. 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 Two PIVs on left forearm. Potassium 3.4 replaced by 40 mEq oral K+ tablet, Cre 1.75. CBC due tomorrow 5am. Blood culture pending. Need follow up with CXR of new left upper lobe nodular density.

## 2018-09-30 NOTE — Progress Notes (Addendum)
Pharmacy Antibiotic Note  [name] is a 62 y.o. female admitted on 09/28/2018 with MSSA bacteremia. Patient with past medical history significant for diabetes mellitus, coronary artery disease, cardiomyopathy, and AICD. Source of infection is likely second toe on left foot. Patient also has urine culture positive for Ecoli. Pharmacy has been consulted for cefazolin dosing.  Plan: Renal function is improving. Increased cefazolin to 2 g IV q8h. Cefazolin also covers E. Coli.  Will watch renal function daily and adjust as warranted.   Height: 5\' 7"  (170.2 cm) Weight: 275 lb 5.7 oz (124.9 kg) IBW/kg (Calculated) : 61.6  Temp (24hrs), Avg:98.5 F (36.9 C), Min:97.9 F (36.6 C), Max:99.4 F (37.4 C)  Recent Labs  Lab 09/28/18 1202 09/28/18 1213 09/28/18 1421 09/29/18 0217 09/29/18 1451 09/30/18 0540  WBC  --  10.9*  --  8.8  --  7.4  CREATININE  --  1.79*  --  2.40* 2.24* 1.75*  LATICACIDVEN 2.93*  --  2.56*  --   --   --     Estimated Creatinine Clearance: 44.6 mL/min (A) (by C-G formula based on SCr of 1.75 mg/dL (H)).    No Known Allergies  Antimicrobials this admission: Cefepime 12/23 >> 12/24 Metronidazole 12/23 >> 12/24  Vancomycin 12/23 >> 12/24  Cefazolin 12/24 >>   Dose adjustments this admission: Increased cefazolin to 2 g IV q12h from 1 g IV q12h Increased cefazolin to 2g IV q8h due to renal improvement  Microbiology results: 12/23 BCx: MSSA 12/24 BCx: NG x 2 days 12/24 Cdiff (-) 12/23 UCx: Ecoli - pansensitive 12/23 MRSA PCR: negative   Thank you for allowing pharmacy to be a part of this patient's care.   Paticia Stack, PharmD Pharmacy Resident  09/30/2018 10:35 AM

## 2018-09-30 NOTE — Consult Note (Signed)
PULMONARY/CCM PROGRESS NOTE  Requesting MD/Service: Inpatient hospitalist service Date of initial consultation: 09/29/2015 Reason for consultation: Severe sepsis, respiratory distress  PT PROFILE: 62 y.o. female never smoker admitted via [facility] ED with 24 hours of N/V/D, AMS, high fever.  Diagnosis of severe sepsis with unclear source.  Possible sources include urine, pneumonia, left second toe cellulitis/osteomyelitis   MAJOR EVENTS/TEST RESULTS: 12/23 admission as documented above.  Admission diagnosis of severe sepsis, unclear source with acute respiratory distress. Admitted to ICU/SDU on BiPAP 12/23 L foot plain Xray: Soft tissue swelling and periarticular bony resorption identified at the second PIP joint. Findings are compatible with either inflammatory or infectious arthropathy 12/23 Podiatry consultation: No further evaluation or intervention recommended 12/24 Trop I 24.26. BNP 1758. Heparin infusion initiated.  12/24 BC positive for staph aureus 12/24 Worsening renal function with oliguria 12/24 ID consultation: Antibiotics narrowed to cefazolin only 12/24 Echocardiogram: LVEF 45-50%.  Study not sufficient to rule out valvular vegetations. 12/25 Cardiology consultation: NSTEMI. Heparin DC'd due to thrombocytopenia.  Recommend TEE when patient sufficiently stable to undergo.  Carvedilol and statin initiated.  Recommend ischemia evaluation when patient sufficiently stabilized to undergo such 12/25 respiratory status much improved.  Renal function much improved.  Cognition nearly back to normal.  No new problems.  Transfer to telemetry floor with cardiac monitoring ordered.   INDWELLING DEVICES::   MICRO DATA: MRSA PCR 12/23 >> negative  Urine 12/23 >> greater than 100k E. coli (sensitive to cefazolin) Blood 12/23 >> 2/2 S aureus GI panel 12/24 >>  C diff PCR 12/24 >> NEG  ANTIMICROBIALS:  Metronidazole 12/23 >> 12/24 Cefepime 12/23 >> 12/24 Vancomycin 12/23 >>  12/24 Cefazolin 12/24 >>    SUBJ:  Cognition back to normal or very nearly so.  No distress.  Comfortable on nasal cannula oxygen.  No new complaints.  Renal function improving.  Dr. Cleda Mccreedy reevaluated left second toe and unroofed an abrasion with expression of pus.  He feels that the toe will require amputation at some point.   Vitals:   09/30/18 0900 09/30/18 1000 09/30/18 1100 09/30/18 1200  BP: (!) 131/57 139/68 (!) 133/59 (!) 144/66  Pulse: 70 69 74 77  Resp: 16 13 (!) 21 16  Temp:    97.8 F (36.6 C)  TempSrc:    Oral  SpO2: 98% 98% 100% 98%  Weight:      Height:         EXAM:  Gen: NAD HEENT: WNL Neck: No JVD noted Lungs: Minimal right basilar crackles Cardiovascular: Regular, no M Abdomen: Obese, soft, NT, NABS Ext: Trace symmetric pretibial edema.  Left second toe looks more erythematous Neuro: Cognition improved, CNs intact, no focal deficits. Skin: Limited exam, no lesions noted  DATA:   BMP Latest Ref Rng & Units 09/30/2018 09/29/2018 09/29/2018  Glucose 70 - 99 mg/dL 167(H) 182(H) 325(H)  BUN 8 - 23 mg/dL 59(H) 60(H) 58(H)  Creatinine 0.44 - 1.00 mg/dL 1.75(H) 2.24(H) 2.40(H)  BUN/Creat Ratio 12 - 28 - - -  Sodium 135 - 145 mmol/L 137 136 136  Potassium 3.5 - 5.1 mmol/L 3.4(L) 3.3(L) 3.5  Chloride 98 - 111 mmol/L 105 104 105  CO2 22 - 32 mmol/L 22 21(L) 23  Calcium 8.9 - 10.3 mg/dL 8.0(L) 7.9(L) 7.9(L)    CBC Latest Ref Rng & Units 09/30/2018 09/29/2018 09/28/2018  WBC 4.0 - 10.5 K/uL 7.4 8.8 10.9(H)  Hemoglobin 12.0 - 15.0 g/dL 11.5(L) 11.3(L) 11.8(L)  Hematocrit 36.0 - 46.0 % 34.3(L) 33.8(L)  35.2(L)  Platelets 150 - 400 K/uL 53(L) 62(L) 81(L)    CXR: Improved aeration with minimal RLL atelectasis or infiltrate  I have personally reviewed all chest radiographs reported above including CXRs and CT chest unless otherwise indicated     IMPRESSION:   1) MSSA bacteremia - concern for valvular or AICD wire endocarditis 2) pulmonary infiltrates,  resolved.  Doubt pneumonia 3) non-STEMI, history of ischemic cardiomyopathy with CHF 4) oliguric AKI, resolving.  (Baseline creatinine 1.3) 5) severe nausea/vomiting/diarrhea, resolved 6) type 2 diabetes with hyperglycemia, controlled 7) E. coli cystitis.  Covered with cefazolin 8) acute thrombocytopenia 9) acute encephalopathy, resolved 10) osteomyelitis L second toe   PLAN:  Transfer to cardiology floor Continue supplemental oxygen as needed to maintain SPO2 >90% Cardiology following.  Will need TEE and ischemia evaluation when stabilized Heparin discontinued 12/25 Monitor BMET intermittently Monitor I/Os Correct electrolytes as indicated Continue cefazolin.  ID service following Monitor CBC intermittently Transfuse per usual guidelines Podiatry following.  Will eventually need amputation of L second toe   Patient and her husband updated at bedside  CCM time:  The above time includes time spent in consultation with patient and/or family members and reviewing care plan with admitting service  Merton Border, MD PCCM service Mobile [phone] Pager [phone] 09/30/2018 12:27 PM

## 2018-09-30 NOTE — Progress Notes (Signed)
Date of Admission:  09/28/2018     ID: [name] is a 62 y.o. female  Active Problems:   Sepsis ([place])  MSSA bacteremia Infected left 2nd toe  Subjective: Feeling better No fever No headache No nausea or vomiting  Medications:  . aspirin EC  81 mg Oral Daily  . carvedilol  3.125 mg Oral BID WC  . chlorhexidine  15 mL Mouth Rinse BID  . insulin aspart  0-20 Units Subcutaneous TID WC  . insulin aspart  0-5 Units Subcutaneous QHS  . insulin glargine  15 Units Subcutaneous Daily  . mouth rinse  15 mL Mouth Rinse q12n4p  . rosuvastatin  40 mg Oral q1800  . sodium chloride flush  3 mL Intravenous Q12H    Device Foley  Objective: Vital signs in last 24 hours: Temp:  [97.8 F (36.6 C)-99 F (37.2 C)] 97.8 F (36.6 C) (12/25 1200) Pulse Rate:  [63-83] 83 (12/25 1300) Resp:  [13-22] 15 (12/25 1300) BP: (109-156)/(54-96) 114/96 (12/25 1300) SpO2:  [88 %-100 %] 98 % (12/25 1300)  PHYSICAL EXAM:  General: Alert, cooperative, no distress, appears stated age.  Head: Normocephalic, without obvious abnormality, atraumatic. Eyes: Conjunctivae clear, anicteric sclerae. Pupils are equal ENT Nares normal. No drainage or sinus tenderness. Lips, mucosa, and tongue normal. No Thrush Neck: Supple, symmetrical, no adenopathy, thyroid: non tender no carotid bruit and no JVD. Back: No CVA tenderness. Lungs: b/l air entry Heart: Regular rate and rhythm, no murmur, rub or gallop. Abdomen: Soft, non-tender,not distended. Bowel sounds normal. No masses Extremities: left 2nd toe-violaceous erythema, swollen, absent nail with eschar Skin: No rashes or lesions. Or bruising Lymph: Cervical, supraclavicular normal. Neurologic: Grossly non-focal foley  Lab Results Recent Labs    09/29/18 0217 09/29/18 1451 09/30/18 0540  WBC 8.8  --  7.4  HGB 11.3*  --  11.5*  HCT 33.8*  --  34.3*  NA 136 136 137  K 3.5 3.3* 3.4*  CL 105 104 105  CO2 23 21* 22  BUN 58* 60* 59*   CREATININE 2.40* 2.24* 1.75*   Liver Panel Recent Labs    09/29/18 0217 09/30/18 0540  PROT 6.0* 5.6*  ALBUMIN 2.9* 2.6*  AST 103* 100*  ALT 38 45*  ALKPHOS 40 42  BILITOT 0.8 0.6    Microbiology:  [facility]- 12/23 MSSA 12/24 Pending UC- 12/23- e.coli Studies/Results: Dg Chest Port 1 View  Result Date: 09/30/2018 CLINICAL DATA:  Acute respiratory failure. EXAM: PORTABLE CHEST 1 VIEW COMPARISON:  Multiple recent previous exams. FINDINGS: 0515 hours. Low volume film. No edema. Left upper lobe nodular density is new since 09/28/2018. Interstitial markings are diffusely coarsened with chronic features. Cardiopericardial silhouette is at upper limits of normal for size. Left pacer/AICD again noted. Telemetry leads overlie the chest. IMPRESSION: New left upper lobe nodular density. Given the relatively rapid interval appearance, this may be external to the patient or confluence of shadows. Close attention this region on follow-up recommended. Otherwise stable. Electronically Signed   By: Misty Stanley M.D.   On: 09/30/2018 07:13   Dg Chest Port 1 View  Result Date: 09/29/2018 CLINICAL DATA:  Acute onset of respiratory failure. EXAM: PORTABLE CHEST 1 VIEW COMPARISON:  Chest radiograph performed 09/28/2018 FINDINGS: There is mild elevation of the left hemidiaphragm. Vascular congestion is noted. Right midlung and right basilar airspace opacity may reflect pneumonia or asymmetric pulmonary edema. No definite pleural effusion or pneumothorax is seen. The cardiomediastinal silhouette is normal in size. A  pacemaker/AICD is noted overlying the left chest wall, with leads ending overlying the right atrium, right ventricle and coronary sinus. No acute osseous abnormalities are identified. IMPRESSION: Mild elevation of the left hemidiaphragm. Vascular congestion noted. Right midlung and right basilar airspace opacity may reflect pneumonia or asymmetric pulmonary edema. Electronically Signed   By: Garald Balding M.D.   On: 09/29/2018 05:26     Assessment/Plan: 62 y.o. female with a history of diabetes mellitus, coronary artery disease, cardiomyopathy, AICD, is admitted with altered mental status , vomiting and diarrhea of 1 day duration. ? ?Staph aureus bacteremia.   source second toe left foot. With AICD in place concern for vegetation of the wire or valvular vegetation.   TEE as 2 d echo no valvular lesion Repeat blood cultures until clear of bacteremia. Continue cefazolin  Encephalopathy.  due to infection- improving  Left foot second toe infection.  pus expressed by podiatrist and culture sent  Diabetes mellitus uncontrolled  AKI on CKD.  Combination of factors.  Infection, hypovolemia. Improving  NSTEMI-seen by cardiology- workup once she is stable    Coronary artery disease with history of DES to LAD in 2013.  on aspirin.   Thrombocytopenia -?HIT work up -   E.coli  in the urine. Likely contamination as she had diarrhea. S to cefazolin  ___________________________________________________  Discussed the management with the husband  Tsosie Billing MD Infectious Diseases [phone]

## 2018-09-30 NOTE — Progress Notes (Signed)
ANTICOAGULATION CONSULT NOTE   Pharmacy Consult for Heparin  Indication: chest pain/ACS  Patient Measurements: Height: 5\' 7"  (170.2 cm) Weight: 275 lb 5.7 oz (124.9 kg) IBW/kg (Calculated) : 61.6 Heparin Dosing Weight:  101 kg   Vital Signs: Temp: 97.9 F (36.6 C) (12/25 0400) Temp Source: Oral (12/25 0400) BP: 125/64 (12/25 0600) Pulse Rate: 64 (12/25 0600)  Labs: Recent Labs    09/28/18 1213 09/28/18 1725 09/28/18 1902 09/28/18 2030 09/29/18 0217 09/29/18 1451 09/29/18 2123 09/30/18 0540  HGB 11.8*  --   --   --  11.3*  --   --  11.5*  HCT 35.2*  --   --   --  33.8*  --   --  34.3*  PLT 81*  --   --   --  62*  --   --  53*  APTT  --   --   --  43*  --   --   --   --   LABPROT  --   --   --  18.1* 17.5*  --   --   --   INR  --   --   --  1.52 1.45  --   --   --   HEPARINUNFRC  --   --   --   --   --  0.60 0.63 0.41  CREATININE 1.79*  --   --   --  2.40* 2.24*  --   --   TROPONINI 0.35* 2.89* 5.20*  --  24.26*  --   --   --     Estimated Creatinine Clearance: 34.8 mL/min (A) (by C-G formula based on SCr of 2.24 mg/dL (H)).   Medical History: Past Medical History:  Diagnosis Date  . Acute myocardial infarction, subendocardial infarction, initial episode of care ([place]) 07/21/2012  . Acute osteomyelitis involving ankle and foot ([place]) 02/06/2015  . Acute systolic heart failure ([place]) 07/21/2012   New onset 07/19/12; admission to [facility] ED. Elevated Troponins.  S/p 2D-echo with EF 20-25%.  S/p cardiac catheterization with stenting LAD.  Repeat 2D-echo 10/2011 with improved EF of 35%.   . Anemia   . Automatic implantable cardioverter-defibrillator in situ    a. MDT CRT-D 06/2014, SN: [id number]    . CAD (coronary artery disease)    a. cardiac cath 101/04/2012: PCI/DES to chronically occluded mLAD, consideration PCI to diag branch in 4 weeks.   . Cataract   . Chicken pox   . Chronic systolic CHF (congestive heart failure) ([facility])    a. mixed ICM & NICM; b. EF 20-25% by  echo 07/2012, mid-dist 2/3 of LV sev HK/AK, mild MR. echo 10/2012: EF 30-35%, sev HK ant-septal & inf walls, GR1DD, mild MR, PASP 33. c. echo 02/2013: EF 30%, GR1DD, mild MR. echo 04/2014: EF 30%, Septal-lat dyssynchrony, global HK, inf AK, GR1DD, mild MR. d. echo 10/2014: EF50-55%, WM nl, GR1DD, septal mild paradox. e. echo 02/2015: EF 50-55%, wm   . Depression   . Heart attack ([place])   . Heel ulcer ([place]) 04/27/2015  . History of blood transfusion ~ 2011   "plasma; had neuropathy; couldn't walk"  . Hypertension   . LBBB (left bundle branch block)   . Neuromuscular disorder ([place])   . Neuropathy 2011  . Obesity, unspecified   . OSA on CPAP    Moderate with AHI 23/hr and now on CPAP at 16cm H2O.  Her DME is [facility]  . Pure hypercholesterolemia   . Type  II diabetes mellitus ([place])   . Unspecified vitamin D deficiency     Medications:  Medications Prior to Admission  Medication Sig Dispense Refill Last Dose  . AMBULATORY NON FORMULARY MEDICATION 1 Units by Other route once. Medication Name: foot brace 1 Units 0 Taking  . Calcium Carb-Cholecalciferol (CALCIUM 600 + D PO) Take 1 tablet by mouth 2 (two) times daily.   09/28/2018 at am  . citalopram (CELEXA) 20 MG tablet Take 1 tablet (20 mg total) by mouth daily. 90 tablet 1 09/28/2018 at am  . clopidogrel (PLAVIX) 75 MG tablet Take 1 tablet (75 mg total) by mouth daily. (Patient taking differently: Take 75 mg by mouth at bedtime. ) 90 tablet 3 09/26/2018 at pm  . ferrous sulfate 325 (65 FE) MG tablet Take 325 mg by mouth daily.   09/28/2018 at am  . glimepiride (AMARYL) 4 MG tablet Take 1 tablet (4 mg total) by mouth 2 (two) times daily with a meal. 180 tablet 3 09/28/2018 at am  . glucose blood (ONE TOUCH ULTRA TEST) test strip Check sugar three times daily dx: DMII uncontrolled insulin dependent with complications 384 each 11 Taking  . Insulin Glargine (LANTUS) 100 UNIT/ML Solostar Pen Inject 30 Units into the skin at bedtime. 15 mL 11 09/26/2018 at pm   . Insulin Pen Needle (B-D ULTRAFINE III SHORT PEN) 31G X 8 MM MISC USE AS DIRECTED WITH LANTUS; dx: DMII uncontrolled, insulin dependent, with complications 665 each 11 Taking  . losartan (COZAAR) 25 MG tablet Take 25 mg by mouth at bedtime.   09/26/2018 at pm  . omega-3 acid ethyl esters (LOVAZA) 1 g capsule Take 1 g by mouth 2 (two) times daily.   09/28/2018 at am  . torsemide (DEMADEX) 20 MG tablet Take 1 tablet (20 mg total) by mouth daily. (Patient taking differently: Take 20 mg by mouth at bedtime. ) 90 tablet 1 09/26/2018 at pm  . traMADol (ULTRAM) 50 MG tablet Take 1 tablet (50 mg total) by mouth every 6 (six) hours as needed. for pain (Patient taking differently: Take 50 mg by mouth every 6 (six) hours as needed for moderate pain. ) 120 tablet 5 unknown at unknown  . Dulaglutide (TRULICITY) 9.93 TT/0.1XB SOPN Inject 0.75 mg into the skin once a week. (Patient not taking: Reported on 09/28/2018) 2 mL 5 Not Taking at Unknown time  . gabapentin (NEURONTIN) 600 MG tablet Take 1 tablet (600 mg total) by mouth 3 (three) times daily. (Patient not taking: Reported on 09/28/2018) 270 tablet 1 Not Taking at Unknown time  . rosuvastatin (CRESTOR) 40 MG tablet TAKE 1 TABLET BY MOUTH ONCE DAILY (Patient not taking: Reported on 09/28/2018) 90 tablet 3 Not Taking at Unknown time    Assessment: Pharmacy consulted to dose heparin in this 62 year old female with ACS/NSTEMI.  Pt received lovenox 40 mg SQ on 12/23 @ 20:00 and was given an additional 85mg  Lovenox for a total of 125mg . CrCl = 43.6 m/min  Heparin Course: 12/24 AM initiation: 1400 units/hr 12/24 1451 HL 0.60:   Goal of Therapy:  Heparin level 0.3-0.7 units/ml Monitor platelets by anticoagulation protocol: Yes   Plan:  Heparin level is therapeutic Will draw next heparin level in another 6 hrs Will check HL and CBC daily.   12/24:  HL @ 2123 = 0.63 Will continue pt on current rate and recheck HL on 12/25 with AM labs.   12/25 AM  heparin level 0.41. Continue current regimen. Recheck heparin  level and CBC with tomorrow AM labs.  Tiondra Fang S, PharmD 09/30/2018,6:29 AM

## 2018-09-30 NOTE — Consult Note (Signed)
Cardiology Consultation:   Patient ID: [name] MRN: [id number]; DOB: 09/01/1956  Admit date: 09/28/2018 Date of Consult: 09/30/2018  Primary Care Provider: Rutherford Guys, MD Primary Cardiologist: Dr. Rockey Situ Primary Electrophysiologist:  Dr. Caryl Comes   Patient Profile:   [name] is a 62 y.o. female with a hx of coronary artery disease and chronic systolic heart failure status post ICD placement who is being seen today for the evaluation of bacteremia and non-ST elevation myocardial infarction at the request of Dr. Shawna Orleans.  History of Present Illness:   [name] is a 62 year old female who is well-known to [place].  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  Her initial cardiac presentation was in 2013 with heart failure and EF of 20 to 25%.  Cardiac catheterization showed significant three-vessel coronary artery disease with chronic occlusion of the LAD.  This was treated with PCI at that time but the distal vessel was small in size and there was diffuse dissection that was treated with balloon angioplasty.  She ultimately underwent ICD CRT placement in 2015 with normalization of ejection fraction.  She has multiple other chronic conditions that include obesity, type 2 diabetes since she was 62 years old complicated by nephropathy and neuropathy.  She presented with mental status change, nausea and vomiting.  She was found to have MSSA bacteremia with osteomyelitis of the left second toe as the source.  She was noted to have elevated troponin which subsequently increased to 24.  The patient denies any chest pain or shortness of breath.  On presentation, her creatinine was above baseline but that has gradually improved.  She was noted to have thrombocytopenia on presentation that worsened down to 50,000 this morning.  Past Medical History:  Diagnosis Date  . Acute  myocardial infarction, subendocardial infarction, initial episode of care ([place]) 07/21/2012  . Acute osteomyelitis involving ankle and foot ([place]) 02/06/2015  . Acute systolic heart failure ([place]) 07/21/2012   New onset 07/19/12; admission to [facility] ED. Elevated Troponins.  S/p 2D-echo with EF 20-25%.  S/p cardiac catheterization with stenting LAD.  Repeat 2D-echo 10/2011 with improved EF of 35%.   . Anemia   . Automatic implantable cardioverter-defibrillator in situ    a. MDT CRT-D 06/2014, SN: [id number]    . CAD (coronary artery disease)    a. cardiac cath 101/04/2012: PCI/DES to chronically occluded mLAD, consideration PCI to diag branch in 4 weeks.   . Cataract   . Chicken pox   . Chronic systolic CHF (congestive heart failure) (HCC)    a. mixed ICM & NICM; b. EF 20-25% by echo 07/2012, mid-dist 2/3 of LV sev HK/AK, mild MR. echo 10/2012: EF 30-35%, sev HK ant-septal & inf walls, GR1DD, mild MR, PASP 33. c. echo 02/2013: EF 30%, GR1DD, mild MR. echo 04/2014: EF 30%, Septal-lat dyssynchrony, global HK, inf AK, GR1DD, mild MR. d. echo 10/2014: EF50-55%, WM nl, GR1DD, septal mild paradox. e. echo 02/2015: EF 50-55%, wm   . Depression   . Heart attack ([place])   . Heel ulcer ([place]) 04/27/2015  . History of blood transfusion ~ 2011   "plasma; had neuropathy; couldn't walk"  . Hypertension   . LBBB (left bundle branch block)   . Neuromuscular disorder ([place])   . Neuropathy 2011  . Obesity, unspecified   . OSA on CPAP    Moderate with AHI 23/hr and now on CPAP at 16cm H2O.  Her DME is [facility]  . Pure hypercholesterolemia   . Type II diabetes mellitus ([place])   . Unspecified vitamin D deficiency     Past Surgical History:  Procedure Laterality Date  . AMPUTATION TOE Right 06/18/2015   Procedure: AMPUTATION TOE;  Surgeon: Samara Deist, DPM;  Location: [facility] ORS;  Service: Podiatry;  Laterality: Right;  . BACK SURGERY    . BI-VENTRICULAR IMPLANTABLE CARDIOVERTER DEFIBRILLATOR N/A 07/06/2014   Procedure:  BI-VENTRICULAR IMPLANTABLE CARDIOVERTER DEFIBRILLATOR  (CRT-D);  Surgeon: Deboraha Sprang, MD;  Location: [facility] CATH LAB;  Service: Cardiovascular;  Laterality: N/A;  . BI-VENTRICULAR IMPLANTABLE CARDIOVERTER DEFIBRILLATOR  (CRT-D)  07/06/2014  . BILATERAL OOPHORECTOMY  01/2011   ovarian cyst benign  . COLONOSCOPY WITH PROPOFOL Left 02/22/2015   Procedure: COLONOSCOPY WITH PROPOFOL;  Surgeon: Hulen Luster, MD;  Location: [facility] ENDOSCOPY;  Service: Endoscopy;  Laterality: Left;  . CORONARY ANGIOPLASTY WITH STENT PLACEMENT Left 07/2012   new onset systolic CHF; elevated troponins.  Cardiac catheterization with stenting to LAD; EF 15%.  2D-echo: EF 20-25%.  . ESOPHAGOGASTRODUODENOSCOPY N/A 02/22/2015   Procedure: ESOPHAGOGASTRODUODENOSCOPY (EGD);  Surgeon: Hulen Luster, MD;  Location: [facility] ENDOSCOPY;  Service: Endoscopy;  Laterality: N/A;  . INCISION AND DRAINAGE ABSCESS Right 2007   groin; with ICU stay due to sepsis.  Marland Kitchen LAPAROSCOPIC CHOLECYSTECTOMY  2011  . LEFT HEART CATHETERIZATION WITH CORONARY ANGIOGRAM N/A 07/21/2012   Procedure: LEFT HEART CATHETERIZATION WITH CORONARY ANGIOGRAM;  Surgeon: Jolaine Artist, MD;  Location: [facility] CATH LAB;  Service: Cardiovascular;  Laterality: N/A;  . [place]   L4-5  . PERCUTANEOUS CORONARY STENT INTERVENTION (PCI-S) N/A 07/23/2012   Procedure: PERCUTANEOUS CORONARY STENT INTERVENTION (PCI-S);  Surgeon: Sherren Mocha, MD;  Location: [facility] CATH LAB;  Service: Cardiovascular;  Laterality: N/A;  . PERIPHERAL VASCULAR CATHETERIZATION N/A 02/10/2015   Procedure: Picc Line Insertion;  Surgeon: Katha Cabal, MD;  Location: [place] CV LAB;  Service: Cardiovascular;  Laterality: N/A;  . TUBAL LIGATION  1981  . VAGINAL HYSTERECTOMY  01/2011   Fibroids/DUB.  Ovaries removed. [place].     Home Medications:  Prior to Admission medications   Medication Sig Start Date End Date Taking? Authorizing Provider  AMBULATORY NON FORMULARY MEDICATION 1 Units by  Other route once. Medication Name: foot brace 11/15/15  Yes Patel, Donika K, DO  Calcium Carb-Cholecalciferol (CALCIUM 600 + D PO) Take 1 tablet by mouth 2 (two) times daily.   Yes [provider]  citalopram (CELEXA) 20 MG tablet Take 1 tablet (20 mg total) by mouth daily. 04/15/18  Yes Wardell Honour, MD  clopidogrel (PLAVIX) 75 MG tablet Take 1 tablet (75 mg total) by mouth daily. Patient taking differently: Take 75 mg by mouth at bedtime.  04/15/18  Yes Wardell Honour, MD  ferrous sulfate 325 (65 FE) MG tablet Take 325 mg by mouth daily.   Yes [provider]  glimepiride (AMARYL) 4 MG tablet Take 1 tablet (4 mg total) by mouth 2 (two) times daily with a meal. 04/15/18  Yes Wardell Honour, MD  glucose blood (ONE TOUCH ULTRA TEST) test strip Check sugar three times daily dx: DMII uncontrolled insulin dependent with complications 1/60/73  Yes Wardell Honour, MD  Insulin Glargine (LANTUS) 100 UNIT/ML Solostar Pen Inject 30 Units into the skin at bedtime. 04/15/18  Yes Wardell Honour, MD  Insulin Pen Needle (B-D ULTRAFINE III SHORT PEN) 31G X 8 MM MISC USE AS DIRECTED WITH LANTUS;  dx: DMII uncontrolled, insulin dependent, with complications 6/59/93  Yes Wardell Honour, MD  losartan (COZAAR) 25 MG tablet Take 25 mg by mouth at bedtime.   Yes [provider]  omega-3 acid ethyl esters (LOVAZA) 1 g capsule Take 1 g by mouth 2 (two) times daily.   Yes [provider]  torsemide (DEMADEX) 20 MG tablet Take 1 tablet (20 mg total) by mouth daily. Patient taking differently: Take 20 mg by mouth at bedtime.  04/15/18  Yes Wardell Honour, MD  traMADol (ULTRAM) 50 MG tablet Take 1 tablet (50 mg total) by mouth every 6 (six) hours as needed. for pain Patient taking differently: Take 50 mg by mouth every 6 (six) hours as needed for moderate pain.  04/15/18  Yes Wardell Honour, MD  Dulaglutide (TRULICITY) 5.70 VX/7.9TJ SOPN Inject 0.75 mg into the skin once a week. Patient not  taking: Reported on 09/28/2018 04/15/18   Wardell Honour, MD  gabapentin (NEURONTIN) 600 MG tablet Take 1 tablet (600 mg total) by mouth 3 (three) times daily. Patient not taking: Reported on 09/28/2018 04/15/18   Wardell Honour, MD  rosuvastatin (CRESTOR) 40 MG tablet TAKE 1 TABLET BY MOUTH ONCE DAILY Patient not taking: Reported on 09/28/2018 12/16/17   Minna Merritts, MD    Inpatient Medications: Scheduled Meds: . aspirin EC  81 mg Oral Daily  . carvedilol  3.125 mg Oral BID WC  . chlorhexidine  15 mL Mouth Rinse BID  . insulin aspart  0-20 Units Subcutaneous TID WC  . insulin aspart  0-5 Units Subcutaneous QHS  . insulin glargine  15 Units Subcutaneous Daily  . mouth rinse  15 mL Mouth Rinse q12n4p  . rosuvastatin  40 mg Oral q1800  . sodium chloride flush  3 mL Intravenous Q12H   Continuous Infusions: . sodium chloride    .  ceFAZolin (ANCEF) IV     PRN Meds: sodium chloride, acetaminophen **OR** acetaminophen, [DISCONTINUED] ondansetron **OR** ondansetron (ZOFRAN) IV, promethazine, sodium chloride flush, traMADol  Allergies:   No Known Allergies  Social History:   Social History   Socioeconomic History  . Marital status: Married    Spouse name: [name]  . Number of children: 2  . Years of education: 18  . Highest education level: High school graduate  Occupational History  . Occupation: disabled    Fish farm manager: UNEMPLOYED    Comment: 03/2010 for peripheral neuropathy  . 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  Social Needs  . Financial resource strain: Not hard at all  . Food insecurity:    Worry: Never true    Inability: Never true  . Transportation needs:    Medical: No    Non-medical: No  Tobacco Use  . Smoking status: Never Smoker  . Smokeless tobacco: Never Used  Substance and Sexual Activity  . Alcohol use: No    Alcohol/week: 0.0 standard drinks  . Drug use: No  . Sexual activity: Never    Birth control/protection: Post-menopausal, Surgical    Lifestyle  . Physical activity:    Days per week: 0 days    Minutes per session: 0 min  . Stress: Rather much  Relationships  . Social connections:    Talks on phone: More than three times a week    Gets together: More than three times a week    Attends religious service: Never    Active member of club or organization: No    Attends  meetings of clubs or organizations: Never    Relationship status: Married  . Intimate partner violence:    Fear of current or ex partner: No    Emotionally abused: No    Physically abused: No    Forced sexual activity: No  Other Topics Concern  . Not on file  Social History Narrative   Marital status:Married x 45 yrs. Happily married, no abuse.       Children:  2 children (daughter 68, son 75). Two grandsons and 2 step grandchildren.  1 gg.      Lives: with husband, daughter, son-in-law, 2 grandsons, and 1 on the way.      Employment: disability for peripheral neuropathy 2012.  Previously had home daycare.      Tobacco: none      Alcohol: none      Drugs: none      Exercise: none. [employer].      Pets: dog.      Always uses seat belts, smoke detectors in home.      No guns in the home.       Caffeine use: 2 cups coffee per day.       Nutrition: Well balanced diet.    Family History:    Family History  Problem Relation Age of Onset  . Diabetes Mother   . Hypertension Mother   . Arthritis Mother        knees, lumbar DDD, cervical DDD  . Cancer Father        prostate,skin,lymphoma.  . Cancer Brother 63       bladder cancer; non-smoker  . Diabetes Maternal Grandmother   . Heart disease Maternal Grandmother   . COPD Maternal Grandmother   . Diabetes Paternal Grandmother   . Obesity Brother   . Diabetes Son   . Hypertension Son   . Cancer Maternal Grandfather   . Diabetes Paternal Grandfather      ROS:  Please see the history of present illness.   All other ROS reviewed and negative.     Physical Exam/Data:   Vitals:    09/30/18 0700 09/30/18 0800 09/30/18 0900 09/30/18 1000  BP: 137/62 124/63 (!) 131/57 139/68  Pulse: 65 67 70 69  Resp: 17 18 16 13   Temp:  98.2 F (36.8 C)    TempSrc:  Oral    SpO2: 98% 97% 98% 98%  Weight:      Height:        Intake/Output Summary (Last 24 hours) at 09/30/2018 1118 Last data filed at 09/30/2018 1023 Gross per 24 hour  Intake 1220.27 ml  Output 1080 ml  Net 140.27 ml   Filed Weights   09/28/18 1158 09/28/18 1711 09/29/18 0500  Weight: 130 kg 124.9 kg 124.9 kg   Body mass index is 43.13 kg/m.  General: Critically ill-appearing but in no acute distress. HEENT: normal Lymph: no adenopathy Neck: no JVD Endocrine:  No thryomegaly Cardiac:  normal S1, S2; RRR; no murmur  Lungs:  clear to auscultation bilaterally, no wheezing, rhonchi or rales  Abd: soft, nontender, no hepatomegaly  Ext: no edema Musculoskeletal:  No deformities, BUE and BLE strength normal and equal Skin: warm and dry  Neuro:  CNs 2-12 intact, no focal abnormalities noted Psych:  Normal affect   EKG:  The EKG was personally reviewed and demonstrates: Sinus with ventricular paced rhythm Telemetry:  Telemetry was personally reviewed and demonstrates: Paced rhythm  Relevant CV Studies: Echocardiogram done yesterday:  - Left  ventricle: The cavity size was normal. Systolic function was   mildly reduced. The estimated ejection fraction was in the range   of 45% to 50%. The study is not technically sufficient to allow   evaluation of LV diastolic function. - Left atrium: The atrium was mildly dilated.  Impressions:  - Very suboptimal study. No evidence of vegetations. Consider TEE.  Laboratory Data:  Chemistry Recent Labs  Lab 09/29/18 0217 09/29/18 1451 09/30/18 0540  NA 136 136 137  K 3.5 3.3* 3.4*  CL 105 104 105  CO2 23 21* 22  GLUCOSE 325* 182* 167*  BUN 58* 60* 59*  CREATININE 2.40* 2.24* 1.75*  CALCIUM 7.9* 7.9* 8.0*  GFRNONAA 21* 22* 30*  GFRAA 24* 26* 35*   ANIONGAP 8 11 10     Recent Labs  Lab 09/28/18 1213 09/29/18 0217 09/30/18 0540  PROT 6.6 6.0* 5.6*  ALBUMIN 3.4* 2.9* 2.6*  AST 40 103* 100*  ALT 22 38 45*  ALKPHOS 51 40 42  BILITOT 1.3* 0.8 0.6   Hematology Recent Labs  Lab 09/28/18 1213 09/29/18 0217 09/30/18 0540  WBC 10.9* 8.8 7.4  RBC 3.92 3.75* 3.86*  HGB 11.8* 11.3* 11.5*  HCT 35.2* 33.8* 34.3*  MCV 89.8 90.1 88.9  MCH 30.1 30.1 29.8  MCHC 33.5 33.4 33.5  RDW 12.8 13.1 13.3  PLT 81* 62* 53*   Cardiac Enzymes Recent Labs  Lab 09/28/18 1213 09/28/18 1725 09/28/18 1902 09/29/18 0217  TROPONINI 0.35* 2.89* 5.20* 24.26*   No results for input(s): TROPIPOC in the last 168 hours.  BNP Recent Labs  Lab 09/29/18 0217  BNP 1,758.0*    DDimer No results for input(s): DDIMER in the last 168 hours.  Radiology/Studies:  Dg Chest Port 1 View  Result Date: 09/30/2018 CLINICAL DATA:  Acute respiratory failure. EXAM: PORTABLE CHEST 1 VIEW COMPARISON:  Multiple recent previous exams. FINDINGS: 0515 hours. Low volume film. No edema. Left upper lobe nodular density is new since 09/28/2018. Interstitial markings are diffusely coarsened with chronic features. Cardiopericardial silhouette is at upper limits of normal for size. Left pacer/AICD again noted. Telemetry leads overlie the chest. IMPRESSION: New left upper lobe nodular density. Given the relatively rapid interval appearance, this may be external to the patient or confluence of shadows. Close attention this region on follow-up recommended. Otherwise stable. Electronically Signed   By: Misty Stanley M.D.   On: 09/30/2018 07:13   Dg Chest Port 1 View  Result Date: 09/29/2018 CLINICAL DATA:  Acute onset of respiratory failure. EXAM: PORTABLE CHEST 1 VIEW COMPARISON:  Chest radiograph performed 09/28/2018 FINDINGS: There is mild elevation of the left hemidiaphragm. Vascular congestion is noted. Right midlung and right basilar airspace opacity may reflect pneumonia or  asymmetric pulmonary edema. No definite pleural effusion or pneumothorax is seen. The cardiomediastinal silhouette is normal in size. A pacemaker/AICD is noted overlying the left chest wall, with leads ending overlying the right atrium, right ventricle and coronary sinus. No acute osseous abnormalities are identified. IMPRESSION: Mild elevation of the left hemidiaphragm. Vascular congestion noted. Right midlung and right basilar airspace opacity may reflect pneumonia or asymmetric pulmonary edema. Electronically Signed   By: Garald Balding M.D.   On: 09/29/2018 05:26   Dg Chest Port 1 View  Result Date: 09/28/2018 CLINICAL DATA:  Pt with altered mental status and elevated temp emesis x 1 day Pt not able to get OOB the last few days Infection to left second toe noted. Hx of diabetes, hypertension, heart attack,  CHF, CAD, defibrillator. Non smokerams. 2nd toe swollen EXAM: PORTABLE CHEST 1 VIEW COMPARISON:  02/24/2018 FINDINGS: LEFT-sided pacemaker overlies normal cardiac silhouette. Low lung volumes. No effusion, infiltrate pneumothorax. No acute osseous abnormality. IMPRESSION: Low lung volumes.  No acute findings Electronically Signed   By: Suzy Bouchard M.D.   On: 09/28/2018 12:57   Dg Foot Complete Left  Result Date: 09/28/2018 CLINICAL DATA:  Second toe swelling.  Altered mental status. EXAM: LEFT FOOT - COMPLETE 3+ VIEW COMPARISON:  05/03/2016. FINDINGS: There is diffuse soft tissue swelling involving the forefoot. Periarticular bony resorption at the second PIP joint is identified and may be slightly progressive when compared with 05/03/2016. No acute fractures or dislocations identified. Moderate degenerative changes within the midfoot identified. Plantar heel spur noted. IMPRESSION: 1. Soft tissue swelling and periarticular bony resorption identified at the second PIP joint. Findings are compatible with either inflammatory or infectious arthropathy. Electronically Signed   By: Kerby Moors  M.D.   On: 09/28/2018 13:03    Assessment and Plan:   1. Non-ST elevation myocardial infarction: This is in the setting of MSSA bacteremia with early sepsis.  The patient denies any chest pain or shortness of breath.  Difficult to know if this is supply demand ischemia but nonetheless that troponin elevation is significant enough to suggest a primary cardiac event.  She is known to have underlying three-vessel coronary artery disease with previous suboptimal stenting of the LAD.  The patient was treated with Lovenox and heparin but I elected to discontinue these today due to drop platelet count of 50,000.  Continue aspirin.  I added small dose carvedilol and resumed rosuvastatin.  The patient will require further ischemic cardiac evaluation likely with cardiac catheterization or at least [facility] for risk stratification depending on her kidney function. 2. MSSA bacteremia: The patient has an ICD CRT device in place.  She is currently getting antibiotics.  Echocardiogram was suboptimal and thus I recommend a transesophageal echocardiogram which can likely be done on Friday.  Regardless, I think we have to discuss with electrophysiology about the need for device explantation . 3. Acute on chronic systolic heart failure: Ejection fraction dropped from normal.  Chest x-ray does not show significant volume overload.  We will diurese as needed for now especially with worsening renal function. 4. Thrombocytopenia: Platelet count dropped to 50,000.  I discontinued heparin given concerns for HIT.  I ordered HIT antibodies.  Given that the patient has been anticoagulated for about 24 to 36 hours, there is no indication for continued anticoagulation at the present time.  I discussed with Dr. Shawna Orleans     For questions or updates, please contact [place] Please consult [email or web address] for contact info under     Signed, [name], MD  09/30/2018 11:18 AM

## 2018-10-01 DIAGNOSIS — E1122 Type 2 diabetes mellitus with diabetic chronic kidney disease: Secondary | ICD-10-CM

## 2018-10-01 DIAGNOSIS — N390 Urinary tract infection, site not specified: Secondary | ICD-10-CM

## 2018-10-01 DIAGNOSIS — L089 Local infection of the skin and subcutaneous tissue, unspecified: Secondary | ICD-10-CM

## 2018-10-01 DIAGNOSIS — B9620 Unspecified Escherichia coli [E. coli] as the cause of diseases classified elsewhere: Secondary | ICD-10-CM

## 2018-10-01 DIAGNOSIS — N189 Chronic kidney disease, unspecified: Secondary | ICD-10-CM

## 2018-10-01 DIAGNOSIS — E11628 Type 2 diabetes mellitus with other skin complications: Secondary | ICD-10-CM

## 2018-10-01 LAB — COMPREHENSIVE METABOLIC PANEL
ALT: 26 U/L (ref 0–44)
AST: 41 U/L (ref 15–41)
Albumin: 2.6 g/dL — ABNORMAL LOW (ref 3.5–5.0)
Alkaline Phosphatase: 41 U/L (ref 38–126)
Anion gap: 7 (ref 5–15)
BUN: 47 mg/dL — AB (ref 8–23)
CO2: 24 mmol/L (ref 22–32)
Calcium: 8 mg/dL — ABNORMAL LOW (ref 8.9–10.3)
Chloride: 105 mmol/L (ref 98–111)
Creatinine, Ser: 1.25 mg/dL — ABNORMAL HIGH (ref 0.44–1.00)
GFR calc Af Amer: 53 mL/min — ABNORMAL LOW (ref >60)
GFR calc non Af Amer: 45 mL/min — ABNORMAL LOW (ref >60)
Glucose, Bld: 162 mg/dL — ABNORMAL HIGH (ref 70–99)
Potassium: 3.6 mmol/L (ref 3.5–5.1)
Sodium: 136 mmol/L (ref 135–145)
Total Bilirubin: 0.6 mg/dL (ref 0.3–1.2)
Total Protein: 5.5 g/dL — ABNORMAL LOW (ref 6.5–8.1)

## 2018-10-01 LAB — CULTURE, BLOOD (ROUTINE X 2)

## 2018-10-01 LAB — CBC
HCT: 33 % — ABNORMAL LOW (ref 36.0–46.0)
HEMOGLOBIN: 11 g/dL — AB (ref 12.0–15.0)
MCH: 29.7 pg (ref 26.0–34.0)
MCHC: 33.3 g/dL (ref 30.0–36.0)
MCV: 89.2 fL (ref 80.0–100.0)
Platelets: 61 10*3/uL — ABNORMAL LOW (ref 150–400)
RBC: 3.7 MIL/uL — AB (ref 3.87–5.11)
RDW: 13.2 % (ref 11.5–15.5)
WBC: 7.4 10*3/uL (ref 4.0–10.5)
nRBC: 0 % (ref 0.0–0.2)

## 2018-10-01 LAB — GLUCOSE, CAPILLARY
GLUCOSE-CAPILLARY: 246 mg/dL — AB (ref 70–99)
Glucose-Capillary: 151 mg/dL — ABNORMAL HIGH (ref 70–99)
Glucose-Capillary: 266 mg/dL — ABNORMAL HIGH (ref 70–99)
Glucose-Capillary: 269 mg/dL — ABNORMAL HIGH (ref 70–99)

## 2018-10-01 MED ORDER — CEFAZOLIN SODIUM-DEXTROSE 2-4 GM/100ML-% IV SOLN
2.0000 g | Freq: Three times a day (TID) | INTRAVENOUS | Status: DC
  Administered 2018-10-01 – 2018-10-09 (×25): 2 g via INTRAVENOUS
  Filled 2018-10-01 (×31): qty 100

## 2018-10-01 NOTE — Progress Notes (Signed)
[name] at [facility] NAME: [name]    MR#:  [id number]  DATE OF BIRTH:  [birth year]  SUBJECTIVE:  complains of back pain. She is sitting out in the chair. No family in the room  REVIEW OF SYSTEMS:   Review of Systems  Constitutional: Negative for chills, fever and weight loss.  HENT: Negative for ear discharge, ear pain and nosebleeds.   Eyes: Negative for blurred vision, pain and discharge.  Respiratory: Negative for sputum production, shortness of breath, wheezing and stridor.   Cardiovascular: Negative for chest pain, palpitations, orthopnea and PND.  Gastrointestinal: Negative for abdominal pain, diarrhea, nausea and vomiting.  Genitourinary: Negative for frequency and urgency.  Musculoskeletal: Negative for back pain and joint pain.  Neurological: Positive for weakness. Negative for sensory change, speech change and focal weakness.  Psychiatric/Behavioral: Negative for depression and hallucinations. The patient is not nervous/anxious.    Tolerating Diet:yesTolerating PT: pending  DRUG ALLERGIES:  No Known Allergies  VITALS:  Blood pressure (!) 145/60, pulse 68, temperature 98.5 F (36.9 C), temperature source Oral, resp. rate 20, height 5' 7"  (1.702 m), weight 124.9 kg, SpO2 95 %.  PHYSICAL EXAMINATION:   Physical Exam  GENERAL:  62 y.o.-year-old patient lying in the bed with no acute distress. obese EYES: Pupils equal, round, reactive to light and accommodation. No scleral icterus. Extraocular muscles intact.  HEENT: Head atraumatic, normocephalic. Oropharynx and nasopharynx clear.  NECK:  Supple, no jugular venous distention. No thyroid enlargement, no tenderness.  LUNGS:decreased breath sounds bilaterally, no wheezing, rales, rhonchi. No use of accessory muscles of respiration.  CARDIOVASCULAR: S1, S2 normal. No murmurs, rubs, or gallops.  ABDOMEN: Soft, nontender, nondistended. Bowel sounds present. No  organomegaly or mass.  EXTREMITIES: No cyanosis, clubbing or edema b/l.  Left 2nd toe ulceration/ NEUROLOGIC: Cranial nerves II through XII are intact. No focal Motor or sensory deficits b/l.   PSYCHIATRIC:  patient is alert and oriented x 3.  SKIN: No obvious rash, lesion, or ulcer.   LABORATORY PANEL:  CBC Recent Labs  Lab 10/01/18 0647  WBC 7.4  HGB 11.0*  HCT 33.0*  PLT 61*    Chemistries  Recent Labs  Lab 09/29/18 0217  10/01/18 0647  NA 136   < > 136  K 3.5   < > 3.6  CL 105   < > 105  CO2 23   < > 24  GLUCOSE 325*   < > 162*  BUN 58*   < > 47*  CREATININE 2.40*   < > 1.25*  CALCIUM 7.9*   < > 8.0*  MG 1.6*  --   --   AST 103*   < > 41  ALT 38   < > 26  ALKPHOS 40   < > 41  BILITOT 0.8   < > 0.6   < > = values in this interval not displayed.   Cardiac Enzymes Recent Labs  Lab 09/29/18 0217  TROPONINI 24.26*   RADIOLOGY:  Dg Chest Port 1 View  Result Date: 09/30/2018 CLINICAL DATA:  Acute respiratory failure. EXAM: PORTABLE CHEST 1 VIEW COMPARISON:  Multiple recent previous exams. FINDINGS: 0515 hours. Low volume film. No edema. Left upper lobe nodular density is new since 09/28/2018. Interstitial markings are diffusely coarsened with chronic features. Cardiopericardial silhouette is at upper limits of normal for size. Left pacer/AICD again noted. Telemetry leads overlie the chest. IMPRESSION: New left upper lobe nodular density. Given the relatively  rapid interval appearance, this may be external to the patient or confluence of shadows. Close attention this region on follow-up recommended. Otherwise stable. Electronically Signed   By: Misty Stanley M.D.   On: 09/30/2018 07:13   ASSESSMENT AND PLAN:  [name]  is a 62 y.o. female with a known history of coronary artery disease, diabetes mellitus, status post pacemaker defibrillation and multiple other medical problems is brought into the ED with altered mental status.  Patient has fever associated with  nausea vomiting and diarrhea for the past 24 hours according to the daughter at bedside.  Temperature was 102 F and white blood cell count is elevated and the troponins are elevated.  #Metabolic encephalopathy secondary to sepsis--MSSA likely source left toe infection -ID consult noted--IV cefazolin -mentation improved  #Sepsis source could be left second toe infection  -Patient met sepsis criteria at the time of admission with fever, leukocytosis, elevated lactic acid -IV cefazolin -Aggressive hydration with IV fluids -Seen by Podiatry-feels patient may end up getting left second toe amputation -patient has ICD placed and worrisome for possible seeding of wire. Patient to get TEE by Dr. 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 H IT panel pending -continue Asa, statins -cardiac cath planned for Monday  #Diabetes mental sliding scale insulin  #Chronic history of systolic congestive heart failure  Monitor for symptoms and signs of fluid overload  being transferred out of ICU CODE STATUS: full DVT Prophylaxis: SCD  TOTAL TIME TAKING CARE OF THIS PATIENT: *30* minutes.  >50% time spent on counselling and coordination of care  POSSIBLE D/C IN *few* DAYS, DEPENDING ON CLINICAL CONDITION.  Note: This dictation was prepared with Dragon dictation along with smaller phrase technology. Any transcriptional errors that result from this process are unintentional.  Fritzi Mandes M.D on 10/01/2018 at 2:25 PM  Between 7am to 6pm - Pager - [phone]  After 6pm go to [email or web address] - password [email or web address] [facility]  Office  [phone]  CC: Primary care physician; Rutherford Guys, MDPatient ID: [name], female   DOB: Nov 30, 1955, 62 y.o.   MRN: [id number]

## 2018-10-01 NOTE — Progress Notes (Signed)
Progress Note  Patient Name: [name] Date of Encounter: 10/01/2018  Primary Cardiologist: Rockey Situ Primary Electrophysiologist: Caryl Comes  Subjective   No chest pain or SOB. Able to sleep relatively supine with the bed at ~ 20 degrees of elevation. Platelet count is improving to 61 (up from 53 the day prior) off all heparin products. HIT antibody is pending. Renal function has improved from 2.24-->1.75-->1.25. Potassium improved to 3.6 this morning. BP stable in the 242P systolic. Oxygen saturations in the upper 90s% on 2 L supplemental oxygen via nasal cannula.   Inpatient Medications    Scheduled Meds: . aspirin EC  81 mg Oral Daily  . carvedilol  3.125 mg Oral BID WC  . chlorhexidine  15 mL Mouth Rinse BID  . insulin aspart  0-20 Units Subcutaneous TID WC  . insulin aspart  0-5 Units Subcutaneous QHS  . insulin glargine  15 Units Subcutaneous Daily  . mouth rinse  15 mL Mouth Rinse q12n4p  . rosuvastatin  40 mg Oral q1800  . sodium chloride flush  3 mL Intravenous Q12H   Continuous Infusions: . sodium chloride Stopped (09/30/18 2315)  .  ceFAZolin (ANCEF) IV     PRN Meds: sodium chloride, acetaminophen **OR** acetaminophen, [DISCONTINUED] ondansetron **OR** ondansetron (ZOFRAN) IV, promethazine, sodium chloride flush, traMADol   Vital Signs    Vitals:   09/30/18 2024 10/01/18 0431 10/01/18 0746 10/01/18 0746  BP: (!) 123/55 (!) 130/47 (!) 145/60 (!) 145/60  Pulse: 60 66 67 68  Resp:      Temp: 97.9 F (36.6 C) 98.8 F (37.1 C) 98.5 F (36.9 C) 98.5 F (36.9 C)  TempSrc: Oral Oral Oral Oral  SpO2: 99% 99% 98% 98%  Weight:      Height:        Intake/Output Summary (Last 24 hours) at 10/01/2018 1000 Last data filed at 10/01/2018 0453 Gross per 24 hour  Intake 665.35 ml  Output 1400 ml  Net -734.65 ml   Filed Weights   09/28/18 1158 09/28/18 1711 09/29/18 0500  Weight: 130 kg 124.9 kg 124.9 kg    Telemetry    paced - Personally Reviewed  ECG      n/a - Personally Reviewed  Physical Exam   GEN: No acute distress.   Neck: No JVD. Cardiac: RRR, no murmurs, rubs, or gallops.  Respiratory: Clear to auscultation bilaterally.  GI: Soft, nontender, non-distended.   MS: No edema; No deformity. Neuro:  Alert and oriented x 3; Nonfocal.  Psych: Normal affect.  Labs    Chemistry Recent Labs  Lab 09/29/18 0217 09/29/18 1451 09/30/18 0540 10/01/18 0647  NA 136 136 137 136  K 3.5 3.3* 3.4* 3.6  CL 105 104 105 105  CO2 23 21* 22 24  GLUCOSE 325* 182* 167* 162*  BUN 58* 60* 59* 47*  CREATININE 2.40* 2.24* 1.75* 1.25*  CALCIUM 7.9* 7.9* 8.0* 8.0*  PROT 6.0*  --  5.6* 5.5*  ALBUMIN 2.9*  --  2.6* 2.6*  AST 103*  --  100* 41  ALT 38  --  45* 26  ALKPHOS 40  --  42 41  BILITOT 0.8  --  0.6 0.6  GFRNONAA 21* 22* 30* 45*  GFRAA 24* 26* 35* 53*  ANIONGAP 8 11 10 7      Hematology Recent Labs  Lab 09/29/18 0217 09/30/18 0540 10/01/18 0647  WBC 8.8 7.4 7.4  RBC 3.75* 3.86* 3.70*  HGB 11.3* 11.5* 11.0*  HCT 33.8* 34.3* 33.0*  MCV 90.1 88.9 89.2  MCH 30.1 29.8 29.7  MCHC 33.4 33.5 33.3  RDW 13.1 13.3 13.2  PLT 62* 53* 61*    Cardiac Enzymes Recent Labs  Lab 09/28/18 1213 09/28/18 1725 09/28/18 1902 09/29/18 0217  TROPONINI 0.35* 2.89* 5.20* 24.26*   No results for input(s): TROPIPOC in the last 168 hours.   BNP Recent Labs  Lab 09/29/18 0217  BNP 1,758.0*     DDimer No results for input(s): DDIMER in the last 168 hours.   Radiology    Dg Chest Port 1 View  Result Date: 09/30/2018 IMPRESSION: New left upper lobe nodular density. Given the relatively rapid interval appearance, this may be external to the patient or confluence of shadows. Close attention this region on follow-up recommended. Otherwise stable. Electronically Signed   By: Misty Stanley M.D.   On: 09/30/2018 07:13    Cardiac Studies   Echo 09/29/2018: Study Conclusions  - Left ventricle: The cavity size was normal. Systolic  function was   mildly reduced. The estimated ejection fraction was in the range   of 45% to 50%. The study is not technically sufficient to allow   evaluation of LV diastolic function. - Left atrium: The atrium was mildly dilated.  Impressions:  - Very suboptimal study. No evidence of vegetations. Consider TEE.  Patient Profile     62 y.o. female with history of multivessel CAD with CTO of the LAD which was treated with PCI at that time but the distal vessel was small in size and there was diffuse dissection that was treated with PTCA. She also has history of chronic systolic CHF s/p ICD CRT in 2015 with subsequent normalization of EF, osteomyelitis, DM2, HTN, obesity and OSA on CPAP who we are seeing for NSTEMI and MSSA bacteremia.   Assessment & Plan    1. NSTEMI: -Currently, chest pain free -Troponin 24.26 -Planning for LHC on 12/29 as long as her platelet count and renal funciton continue to improve -ASA -Heparin stopped given thrombocytopenia and concern for HIT as below -Coreg and Crestor  -Risks and benefits of cardiac catheterization have been discussed with the patient including risks of bleeding, bruising, infection, kidney damage, injury to a limb, stroke, heart attack, urgent need for bypass, and death. The patient understands these risks and is willing to proceed with the procedure. All questions have been answered and concerns listened to  2. MSSA bacteremia: -As below, the patient is status post ICD CRT and is currently getting antibiotics  -TTE was suboptimal  -Plan for TEE on 12/27 with Dr. Fletcher Anon -We will need to have EP weigh in with regards to possible explanation of her device  -The risks and benefits of transesophageal echocardiogram have been explained including risks of esophageal damage, perforation (1:10,000 risk), bleeding, pharyngeal hematoma as well as other potential complications associated with conscious sedation including aspiration, arrhythmia,  respiratory failure and death. Alternatives to treatment were discussed, questions were answered. Patient is willing to proceed   3. Acute on chronic systolic CHF: -Status post ICD CRT device -She does not appear grossly volume up at this time -Diurese as needed  4. Thrombocytopenia: -Improving  -Continue to avoid all heparin products -Patient has been anticoagulated for 24-36 hours, no indication for further anticoagulation at this time -HIT antibody pending  5. Acute on CKD stage III: -Suspected ATN from spesis -Renal function improving -Avoid nephrotoxins  -Possible cardiac cath on 12/30  6. HLD: -Recent LDL of 32 from 04/2018 -Crestor as above  For questions or updates, please contact [place] Please consult [email or web address] for contact info under Cardiology/STEMI.    Signed, Christell Faith, PA-C [place] Pager: [phone] 10/01/2018, 10:00 AM

## 2018-10-01 NOTE — Progress Notes (Signed)
Patient foley d/c'd at this time.  Patient tolerated well.  898ml clear yellow urine emptied form foley bag.  Patient verbalizes understanding to call staff when she needs to urinate.

## 2018-10-01 NOTE — Progress Notes (Signed)
Patient OOB with 1 assist, Fidelis and gait belt. Ambulated to chair. Tolerated well.  No complaints a this time. Chair alarm on for safety. Call bell in reach

## 2018-10-01 NOTE — Plan of Care (Signed)
  Problem: Clinical Measurements: Goal: Will remain free from infection Outcome: Progressing Note:  Receiving antibiotics Goal: Diagnostic test results will improve Outcome: Progressing   Problem: Nutrition: Goal: Adequate nutrition will be maintained Outcome: Not Progressing Note:  Patient still not wanting to eat.

## 2018-10-01 NOTE — Care Management Important Message (Signed)
Important Message  Patient Details  Name: [name] MRN: [id number] Date of Birth: [birth year]   Medicare Important Message Given:  Yes    [name] 10/01/2018, 1:59 PM

## 2018-10-01 NOTE — Progress Notes (Signed)
Date of Admission:  09/28/2018      ID: [name] is a 62 y.o. female  Active Problems:   Sepsis ([place]) MSSA bacteremia   Subjective: Doing much better No fever  More alert Had lunch Sitting in chair  Medications:  . aspirin EC  81 mg Oral Daily  . carvedilol  3.125 mg Oral BID WC  . chlorhexidine  15 mL Mouth Rinse BID  . insulin aspart  0-20 Units Subcutaneous TID WC  . insulin aspart  0-5 Units Subcutaneous QHS  . insulin glargine  15 Units Subcutaneous Daily  . mouth rinse  15 mL Mouth Rinse q12n4p  . rosuvastatin  40 mg Oral q1800  . sodium chloride flush  3 mL Intravenous Q12H    Objective: Vital signs in last 24 hours: Temp:  [97.8 F (36.6 C)-98.8 F (37.1 C)] 98.5 F (36.9 C) (12/26 0746) Pulse Rate:  [60-83] 68 (12/26 0746) Resp:  [15-20] 20 (12/25 1509) BP: (114-145)/(47-96) 145/60 (12/26 0746) SpO2:  [95 %-100 %] 95 % (12/26 0915)  PHYSICAL EXAM:  General: Alert, cooperative, no distress, appears stated age. Sitting in chair, pale Head: Normocephalic, without obvious abnormality, atraumatic. Eyes: Conjunctivae clear, anicteric sclerae. Pupils are equal ENT Nares normal. No drainage or sinus tenderness. Lips, mucosa, and tongue normal. No Thrush Neck: Supple, symmetrical, no adenopathy, thyroid: non tender no carotid bruit and no JVD. Back: No CVA tenderness. Lungs: Clear to auscultation bilaterally. No Wheezing or Rhonchi. No rales. Heart: Regular rate and rhythm, no murmur, rub or gallop. Abdomen: Soft, non-tender,not distended. Bowel sounds normal. No masses Extremities: left 2 nd toe= infection Skin: No rashes or lesions. Or bruising Lymph: Cervical, supraclavicular normal. Neurologic: Grossly non-focal  Lab Results Recent Labs    09/30/18 0540 10/01/18 0647  WBC 7.4 7.4  HGB 11.5* 11.0*  HCT 34.3* 33.0*  NA 137 136  K 3.4* 3.6  CL 105 105  CO2 22 24  BUN 59* 47*  CREATININE 1.75* 1.25*   Liver Panel Recent Labs     09/30/18 0540 10/01/18 0647  PROT 5.6* 5.5*  ALBUMIN 2.6* 2.6*  AST 100* 41  ALT 45* 26  ALKPHOS 42 41  BILITOT 0.6 0.6   Sedimentation Rate No results for input(s): ESRSEDRATE in the last 72 hours. C-Reactive Protein No results for input(s): CRP in the last 72 hours.  Microbiology:  Studies/Results: Dg Chest Port 1 View  Result Date: 09/30/2018 CLINICAL DATA:  Acute respiratory failure. EXAM: PORTABLE CHEST 1 VIEW COMPARISON:  Multiple recent previous exams. FINDINGS: 0515 hours. Low volume film. No edema. Left upper lobe nodular density is new since 09/28/2018. Interstitial markings are diffusely coarsened with chronic features. Cardiopericardial silhouette is at upper limits of normal for size. Left pacer/AICD again noted. Telemetry leads overlie the chest. IMPRESSION: New left upper lobe nodular density. Given the relatively rapid interval appearance, this may be external to the patient or confluence of shadows. Close attention this region on follow-up recommended. Otherwise stable. Electronically Signed   By: Misty Stanley M.D.   On: 09/30/2018 07:13     Assessment/Plan: 62 y.o.femalewith a history ofdiabetes mellitus, coronary artery disease, cardiomyopathy, AICD, is admitted with altered mental status ,vomiting and diarrhea of 1 day duration. ? ?Staph aureus bacteremia. source second toe left foot. With AICD in place concern for vegetation of the wire or valvular vegetation.  awaiting TEE as 2 d echo a poor study Repeat blood cultures from 12/24- NG Continue cefazolin  Encephalopathy. due  to infection- resolved Left foot second toe infection. pus expressed by podiatrist and culture sent- awaiting surgery  Diabetes mellitus uncontrolled  AKI on CKD.much improved--Combination of factors. Infection, hypovolemia.  NSTEMI-seen by cardiology- workup once she is stable   Coronary artery disease with history of DES to LAD in 2013.  on  aspirin.   Thrombocytopenia -improving -   E.coli  in the urine. Likely contamination as she haddiarrhea. S to cefazolin ___________________________________________________  Discussed the management with the patient

## 2018-10-02 ENCOUNTER — Encounter
Admission: EM | Disposition: A | Discharge status: Home Health | Payer: Self-pay | Source: Home / Self Care | Attending: Internal Medicine

## 2018-10-02 ENCOUNTER — Inpatient Hospital Stay (HOSPITAL_COMMUNITY)
Admit: 2018-10-02 | Discharge: 2018-10-02 | Disposition: A | Discharge status: Home / Self Care | Payer: PPO | Attending: Physician Assistant | Admitting: Physician Assistant

## 2018-10-02 DIAGNOSIS — R7881 Bacteremia: Secondary | ICD-10-CM

## 2018-10-02 HISTORY — PX: TEE WITHOUT CARDIOVERSION: SHX5443

## 2018-10-02 LAB — GLUCOSE, CAPILLARY
GLUCOSE-CAPILLARY: 244 mg/dL — AB (ref 70–99)
Glucose-Capillary: 157 mg/dL — ABNORMAL HIGH (ref 70–99)
Glucose-Capillary: 142 mg/dL — ABNORMAL HIGH (ref 70–99)
Glucose-Capillary: 186 mg/dL — ABNORMAL HIGH (ref 70–99)

## 2018-10-02 LAB — CBC
HCT: 32.8 % — ABNORMAL LOW (ref 36.0–46.0)
Hemoglobin: 10.7 g/dL — ABNORMAL LOW (ref 12.0–15.0)
MCH: 29.3 pg (ref 26.0–34.0)
MCHC: 32.6 g/dL (ref 30.0–36.0)
MCV: 89.9 fL (ref 80.0–100.0)
Platelets: 63 10*3/uL — ABNORMAL LOW (ref 150–400)
RBC: 3.65 MIL/uL — ABNORMAL LOW (ref 3.87–5.11)
RDW: 13.2 % (ref 11.5–15.5)
WBC: 7.6 10*3/uL (ref 4.0–10.5)
nRBC: 0 % (ref 0.0–0.2)

## 2018-10-02 LAB — BASIC METABOLIC PANEL
Anion gap: 7 (ref 5–15)
BUN: 39 mg/dL — ABNORMAL HIGH (ref 8–23)
CO2: 25 mmol/L (ref 22–32)
Calcium: 8 mg/dL — ABNORMAL LOW (ref 8.9–10.3)
Chloride: 104 mmol/L (ref 98–111)
Creatinine, Ser: 1.24 mg/dL — ABNORMAL HIGH (ref 0.44–1.00)
GFR calc non Af Amer: 46 mL/min — ABNORMAL LOW (ref >60)
GFR, EST AFRICAN AMERICAN: 53 mL/min — AB (ref >60)
Glucose, Bld: 157 mg/dL — ABNORMAL HIGH (ref 70–99)
Potassium: 3.4 mmol/L — ABNORMAL LOW (ref 3.5–5.1)
SODIUM: 136 mmol/L (ref 135–145)

## 2018-10-02 LAB — TSH: TSH: 2.288 u[IU]/mL (ref 0.350–4.500)

## 2018-10-02 LAB — HEPARIN INDUCED PLATELET AB (HIT ANTIBODY): Heparin Induced Plt Ab: 0.22 OD (ref 0.000–0.400)

## 2018-10-02 LAB — MAGNESIUM: Magnesium: 1.8 mg/dL (ref 1.7–2.4)

## 2018-10-02 SURGERY — ECHOCARDIOGRAM, TRANSESOPHAGEAL
Anesthesia: Moderate Sedation

## 2018-10-02 MED ORDER — CARVEDILOL 6.25 MG PO TABS: 6.2500 mg | ORAL_TABLET | Freq: Two times a day (BID) | ORAL | Status: DC

## 2018-10-02 MED ORDER — MIDAZOLAM HCL 5 MG/5ML IJ SOLN
INTRAMUSCULAR | Status: AC | PRN
  Administered 2018-10-02: 2 mg via INTRAVENOUS

## 2018-10-02 MED ORDER — FENTANYL CITRATE (PF) 100 MCG/2ML IJ SOLN
INTRAMUSCULAR | Status: AC
  Filled 2018-10-02: qty 2

## 2018-10-02 MED ORDER — FENTANYL CITRATE (PF) 100 MCG/2ML IJ SOLN
INTRAMUSCULAR | Status: AC | PRN
  Administered 2018-10-02: 50 ug via INTRAVENOUS

## 2018-10-02 MED ORDER — MIDAZOLAM HCL 5 MG/5ML IJ SOLN
INTRAMUSCULAR | Status: AC
  Filled 2018-10-02: qty 5

## 2018-10-02 MED ORDER — METOPROLOL TARTRATE 5 MG/5ML IV SOLN
5.0000 mg | INTRAVENOUS | Status: AC
  Administered 2018-10-02: 5 mg via INTRAVENOUS
  Filled 2018-10-02: qty 5

## 2018-10-02 MED ORDER — SODIUM CHLORIDE 0.9 % IV SOLN
INTRAVENOUS | Status: DC
  Administered 2018-10-02: 05:00:00 via INTRAVENOUS

## 2018-10-02 MED ORDER — METOPROLOL TARTRATE 5 MG/5ML IV SOLN
5.0000 mg | Freq: Once | INTRAVENOUS | Status: AC
  Administered 2018-10-02: 5 mg via INTRAVENOUS
  Filled 2018-10-02: qty 5

## 2018-10-02 MED ORDER — METOPROLOL TARTRATE 25 MG PO TABS: 25.0000 mg | ORAL_TABLET | Freq: Four times a day (QID) | ORAL | Status: DC

## 2018-10-02 MED ORDER — BUTAMBEN-TETRACAINE-BENZOCAINE 2-2-14 % EX AERO
INHALATION_SPRAY | CUTANEOUS | Status: AC
  Administered 2018-10-02: 12:00:00
  Filled 2018-10-02: qty 5

## 2018-10-02 MED ORDER — METOPROLOL TARTRATE 5 MG/5ML IV SOLN
INTRAVENOUS | Status: AC
  Administered 2018-10-02: 2.5 mg
  Filled 2018-10-02: qty 5

## 2018-10-02 MED ORDER — POTASSIUM CHLORIDE CRYS ER 20 MEQ PO TBCR
40.0000 meq | EXTENDED_RELEASE_TABLET | Freq: Once | ORAL | Status: AC
  Administered 2018-10-02: 40 meq via ORAL
  Filled 2018-10-02: qty 2

## 2018-10-02 MED ORDER — METOPROLOL TARTRATE 25 MG PO TABS
25.0000 mg | ORAL_TABLET | Freq: Four times a day (QID) | ORAL | Status: DC
  Administered 2018-10-02 – 2018-10-09 (×21): 25 mg via ORAL
  Filled 2018-10-02 (×24): qty 1

## 2018-10-02 MED ORDER — SODIUM CHLORIDE FLUSH 0.9 % IV SOLN
INTRAVENOUS | Status: AC
  Filled 2018-10-02: qty 10

## 2018-10-02 MED ORDER — LIDOCAINE 5 % EX PTCH
1.0000 | MEDICATED_PATCH | CUTANEOUS | Status: DC
  Administered 2018-10-03 – 2018-10-08 (×5): 1 via TRANSDERMAL
  Filled 2018-10-02 (×8): qty 1

## 2018-10-02 MED ORDER — METOPROLOL TARTRATE 25 MG PO TABS
12.5000 mg | ORAL_TABLET | Freq: Four times a day (QID) | ORAL | Status: DC
  Administered 2018-10-02: 12.5 mg via ORAL
  Filled 2018-10-02: qty 1

## 2018-10-02 MED ORDER — LOSARTAN POTASSIUM 25 MG PO TABS
25.0000 mg | ORAL_TABLET | Freq: Every day | ORAL | Status: DC
  Administered 2018-10-02: 25 mg via ORAL
  Filled 2018-10-02: qty 1

## 2018-10-02 MED ORDER — MIDAZOLAM HCL 5 MG/5ML IJ SOLN
INTRAMUSCULAR | Status: AC | PRN
  Administered 2018-10-02: 1 mg via INTRAVENOUS

## 2018-10-02 MED ORDER — LIDOCAINE VISCOUS HCL 2 % MT SOLN
OROMUCOSAL | Status: AC
  Administered 2018-10-02: 15 mL
  Filled 2018-10-02: qty 15

## 2018-10-02 NOTE — Progress Notes (Signed)
Inpatient Diabetes Program Recommendations  AACE/ADA: New Consensus Statement on Inpatient Glycemic Control (2015)  Target Ranges:  Prepandial:   less than 140 mg/dL      Peak postprandial:   less than 180 mg/dL (1-2 hours)      Critically ill patients:  140 - 180 mg/dL   Results for ADREAM, PARZYCH (MRN [id number]) as of 10/02/2018 10:29  Ref. Range 10/01/2018 07:47 10/01/2018 11:34 10/01/2018 17:36 10/01/2018 21:00  Glucose-Capillary Latest Ref Range: 70 - 99 mg/dL 151 (H)  4 units NOVOLOG +  15 units LANTUS  266 (H)  11 units NOVOLOG  246 (H)  5 units NOVOLOG  269 (H)  3 units NOVOLOG    Results for BRIGHID, KOCH (MRN [id number]) as of 10/02/2018 10:29  Ref. Range 10/02/2018 07:23  Glucose-Capillary Latest Ref Range: 70 - 99 mg/dL 157 (H)  4 units NOVOLOG +  15 units LANTUS    Admit with: Metabolic encephalopathy secondary to sepsis  History: DM  Home DM Meds: Trulicity 1.10 mg Qweek (NOT taking)       Amaryl 4 mg BID       Lantus 30 units QHS  Current Orders: Lantus 15 units Daily      Novolog Resistant Correction Scale/ SSI (0-20 units) TID AC + HS     NPO for TEE today.  Having post-meal CBG elevations.        MD- When patient resumes PO diet today, may consider starting Novolog Meal Coverage:  Novolog 4 units TID with meals  (Please add the following Hold Parameters: Hold if pt eats <50% of meal, Hold if pt NPO)     --Will follow patient during hospitalization--  Wyn Quaker RN, MSN, CDE Diabetes Coordinator Inpatient Glycemic Control Team Team Pager: [phone] (8a-5p)

## 2018-10-02 NOTE — Progress Notes (Signed)
[employer] [facility] NAME: [name]    MR#:  [id number]  DATE OF BIRTH:  [birth year]  SUBJECTIVE:  CHIEF COMPLAINT:   Chief Complaint  Patient presents with  . Code Sepsis   -Complains of low back pain today. -Platelets are stable. -For TEE today  REVIEW OF SYSTEMS:  Review of Systems  Constitutional: Positive for malaise/fatigue. Negative for chills and fever.  HENT: Negative for congestion, ear discharge, hearing loss and nosebleeds.   Eyes: Negative for blurred vision and double vision.  Respiratory: Negative for cough and shortness of breath.   Cardiovascular: Negative for chest pain, palpitations and leg swelling.  Gastrointestinal: Negative for abdominal pain, heartburn, nausea and vomiting.  Genitourinary: Negative for dysuria and urgency.  Musculoskeletal: Positive for back pain and myalgias.  Neurological: Negative for dizziness, focal weakness, weakness and headaches.  Psychiatric/Behavioral: Negative for depression.    DRUG ALLERGIES:  No Known Allergies  VITALS:  Blood pressure (!) 137/47, pulse 67, temperature 98.6 F (37 C), temperature source Oral, resp. rate 18, height 5\' 7"  (1.702 m), weight 124.9 kg, SpO2 94 %.  PHYSICAL EXAMINATION:  Physical Exam   GENERAL:  62 y.o.-year-old obese patient lying in the bed with no acute distress.  EYES: Pupils equal, round, reactive to light and accommodation. No scleral icterus. Extraocular muscles intact.  HEENT: Head atraumatic, normocephalic. Oropharynx and nasopharynx clear.  NECK:  Supple, no jugular venous distention. No thyroid enlargement, no tenderness.  LUNGS: Normal breath sounds bilaterally, no wheezing, rales,rhonchi or crepitation. No use of accessory muscles of respiration. Decreased bibasilar breath sounds CARDIOVASCULAR: S1, S2 normal. No  rubs, or gallops. 2/6 systolic murmur present ICD on left chest ABDOMEN: Soft, nontender, nondistended. Bowel  sounds present. No organomegaly or mass.  EXTREMITIES: No pedal edema, cyanosis, or clubbing.  Left foot second toe discoloration NEUROLOGIC: Cranial nerves II through XII are intact. Muscle strength 5/5 in all extremities. Sensation intact. Gait not checked. Global weakness noted. PSYCHIATRIC: The patient is alert and oriented x 3.  SKIN: No obvious rash, lesion, or ulcer.    LABORATORY PANEL:   CBC Recent Labs  Lab 10/02/18 0540  WBC 7.6  HGB 10.7*  HCT 32.8*  PLT 63*   ------------------------------------------------------------------------------------------------------------------  Chemistries  Recent Labs  Lab 09/29/18 0217  10/01/18 0647 10/02/18 0540  NA 136   < > 136 136  K 3.5   < > 3.6 3.4*  CL 105   < > 105 104  CO2 23   < > 24 25  GLUCOSE 325*   < > 162* 157*  BUN 58*   < > 47* 39*  CREATININE 2.40*   < > 1.25* 1.24*  CALCIUM 7.9*   < > 8.0* 8.0*  MG 1.6*  --   --   --   AST 103*   < > 41  --   ALT 38   < > 26  --   ALKPHOS 40   < > 41  --   BILITOT 0.8   < > 0.6  --    < > = values in this interval not displayed.   ------------------------------------------------------------------------------------------------------------------  Cardiac Enzymes Recent Labs  Lab 09/29/18 0217  TROPONINI 24.26*   ------------------------------------------------------------------------------------------------------------------  RADIOLOGY:  No results found.  EKG:   Orders placed or performed during the hospital encounter of 09/28/18  . ED EKG 12-Lead  . ED EKG 12-Lead  . EKG 12-Lead  . EKG 12-Lead  .  EKG 12-Lead  . EKG 12-Lead  . EKG 12-Lead  . EKG 12-Lead    ASSESSMENT AND PLAN:   62 year old female with past medical history significant for CAD status post PCI, chronic systolic CHF status post ICD, ischemic cardiomyopathy, diabetes, hypertension, obesity, sleep apnea on CPAP admitted for MSSA bacteremia and left foot second toe ulceration.  1.  Staph  aureus bacteremia-blood culture showing MSSA, currently on IV Ancef -Repeat blood cultures from 09/29/2018 are negative so far.  Appreciate ID consult. -2D echo without any vegetations.  However awaiting TEE due to presence of ICD and also looking for vegetations. -Afebrile at this time, WBC is within normal limits. -Procalcitonin is still elevated.  2.  NSTEMI-troponins are significantly elevated at 24. -Was started on heparin drip, however drop in platelets noted.  So heparin is discontinued. -Denies any chest pain. - known CAD history.  On aspirin, statin, Coreg and losartan -Appreciate cardiology consult.  If platelets improve, plan for cardiac catheterization on 10/05/2018  3.  Left foot second toe infection-cultures are pending from this. -Appreciate podiatry consult.  Once cardiac issues are resolved, management per podiatry on this wound about debridement  4.  Acute renal failure on CKD-avoid nephrotoxins.  Creatinine is much improved and stable at 1.2  5.  Chronic systolic CHF-euvolemic at this time.  Diuretics on hold given renal failure -Continue cardiac medications including losartan, aspirin, Coreg and statin.  Entresto as outpatient if blood pressure permits  6.  Diabetes mellitus-on Lantus and sliding scale insulin.  7.  Thrombocytopenia-acute after heparin was started.  HIT antibody testing is pending -On aspirin only.  Avoid heparin products for now. -Stable at this time.  Monitor daily  8.  DVT prophylaxis-teds and SCDs  All the records are reviewed and case discussed with Care Management/Social Workerr. Management plans discussed with the patient, family and they are in agreement.  CODE STATUS: Full Code  TOTAL TIME TAKING CARE OF THIS PATIENT: 38 minutes.   POSSIBLE D/C IN 5-6 DAYS, DEPENDING ON CLINICAL CONDITION.   Gladstone Lighter M.D on 10/02/2018 at 10:07 AM  Between 7am to 6pm - Pager - [phone]  After 6pm go to [email or web address] - password [email or web address]  [facility]  Office  [phone]  CC: Primary care physician; Rutherford Guys, MD

## 2018-10-02 NOTE — Progress Notes (Signed)
Progress Note  Patient Name: [name] Date of Encounter: 10/02/2018  Primary Cardiologist: Rockey Situ Primary Electrophysiologist: Caryl Comes  Subjective   No chest pain or SOB. Able to sleep nearly fully supine. Platelet count up to 63. HIT antibody pending. Renal function stable at 1.24. Potassium 3.4. She is for TEE today.   Inpatient Medications    Scheduled Meds: . aspirin EC  81 mg Oral Daily  . carvedilol  3.125 mg Oral BID WC  . chlorhexidine  15 mL Mouth Rinse BID  . insulin aspart  0-20 Units Subcutaneous TID WC  . insulin aspart  0-5 Units Subcutaneous QHS  . insulin glargine  15 Units Subcutaneous Daily  . mouth rinse  15 mL Mouth Rinse q12n4p  . rosuvastatin  40 mg Oral q1800  . sodium chloride flush  3 mL Intravenous Q12H   Continuous Infusions: . sodium chloride Stopped (10/02/18 0322)  . sodium chloride 20 mL/hr at 10/02/18 0647  .  ceFAZolin (ANCEF) IV Stopped (10/02/18 0529)   PRN Meds: sodium chloride, acetaminophen **OR** acetaminophen, [DISCONTINUED] ondansetron **OR** ondansetron (ZOFRAN) IV, promethazine, sodium chloride flush, traMADol   Vital Signs    Vitals:   10/01/18 0915 10/01/18 1553 10/01/18 1953 10/02/18 0504  BP:  (!) 130/57 127/64 (!) 140/49  Pulse:  66 70 65  Resp:      Temp:  98.5 F (36.9 C) 98.5 F (36.9 C) 98.5 F (36.9 C)  TempSrc:  Oral Oral Oral  SpO2: 95% 94% 97% 96%  Weight:      Height:        Intake/Output Summary (Last 24 hours) at 10/02/2018 0737 Last data filed at 10/02/2018 0647 Gross per 24 hour  Intake 1185.37 ml  Output 1100 ml  Net 85.37 ml   Filed Weights   09/28/18 1158 09/28/18 1711 09/29/18 0500  Weight: 130 kg 124.9 kg 124.9 kg    Telemetry    Paced, rare PVC - Personally Reviewed  ECG    n/a - Personally Reviewed  Physical Exam   GEN: No acute distress.   Neck: No JVD. Cardiac: RRR, II/VI systolic murmur at the base, no rubs, or gallops.  Respiratory: Clear to auscultation  bilaterally.  GI: Soft, nontender, non-distended.   MS: No edema; No deformity. Neuro:  Alert and oriented x 3; Nonfocal.  Psych: Normal affect.  Labs    Chemistry Recent Labs  Lab 09/29/18 0217  09/30/18 0540 10/01/18 0647 10/02/18 0540  NA 136   < > 137 136 136  K 3.5   < > 3.4* 3.6 3.4*  CL 105   < > 105 105 104  CO2 23   < > 22 24 25   GLUCOSE 325*   < > 167* 162* 157*  BUN 58*   < > 59* 47* 39*  CREATININE 2.40*   < > 1.75* 1.25* 1.24*  CALCIUM 7.9*   < > 8.0* 8.0* 8.0*  PROT 6.0*  --  5.6* 5.5*  --   ALBUMIN 2.9*  --  2.6* 2.6*  --   AST 103*  --  100* 41  --   ALT 38  --  45* 26  --   ALKPHOS 40  --  42 41  --   BILITOT 0.8  --  0.6 0.6  --   GFRNONAA 21*   < > 30* 45* 46*  GFRAA 24*   < > 35* 53* 53*  ANIONGAP 8   < > 10 7 7    < > =  values in this interval not displayed.     Hematology Recent Labs  Lab 09/30/18 0540 10/01/18 0647 10/02/18 0540  WBC 7.4 7.4 7.6  RBC 3.86* 3.70* 3.65*  HGB 11.5* 11.0* 10.7*  HCT 34.3* 33.0* 32.8*  MCV 88.9 89.2 89.9  MCH 29.8 29.7 29.3  MCHC 33.5 33.3 32.6  RDW 13.3 13.2 13.2  PLT 53* 61* 63*    Cardiac Enzymes Recent Labs  Lab 09/28/18 1213 09/28/18 1725 09/28/18 1902 09/29/18 0217  TROPONINI 0.35* 2.89* 5.20* 24.26*   No results for input(s): TROPIPOC in the last 168 hours.   BNP Recent Labs  Lab 09/29/18 0217  BNP 1,758.0*     DDimer No results for input(s): DDIMER in the last 168 hours.   Radiology    No results found.  Cardiac Studies   Echo 09/29/2018: Study Conclusions  - Left ventricle: The cavity size was normal. Systolic function was mildly reduced. The estimated ejection fraction was in the range of 45% to 50%. The study is not technically sufficient to allow evaluation of LV diastolic function. - Left atrium: The atrium was mildly dilated.  Impressions:  - Very suboptimal study. No evidence of vegetations. Consider TEE.  Patient Profile     62 y.o. female with  history of multivessel CAD with CTO of the LAD which was treated with PCI at that time but the distal vessel was small in size and there was diffuse dissection that was treated with PTCA. She also has history of chronic systolic CHF s/p ICD CRT in 2015 with subsequent normalization of EF, osteomyelitis, DM2, HTN, obesity and OSA on CPAP who we are seeing for NSTEMI and MSSA bacteremia.   Assessment & Plan    1. NSTEMI: -Currently, chest pain free -Troponin 24.26 -Planning for LHC on 12/29 as long as her platelet count and renal funciton continue to improve -ASA -Heparin stopped given thrombocytopenia and concern for HIT as below -Coreg and Crestor  -Risks and benefits of cardiac catheterization have been discussed with the patient including risks of bleeding, bruising, infection, kidney damage, injury to a limb, stroke, heart attack, urgent need for bypass, and death. The patient understands these risks and is willing to proceed with the procedure. All questions have been answered and concerns listened to  2. MSSA bacteremia: -As below, the patient is status post ICD CRT and is currently getting antibiotics  -TTE was suboptimal  -TEE today -We will need to have EP weigh in with regards to possible explanation of her device  -The risks and benefits of transesophageal echocardiogram have been explained including risks of esophageal damage, perforation (1:10,000 risk), bleeding, pharyngeal hematoma as well as other potential complications associated with conscious sedation including aspiration, arrhythmia, respiratory failure and death. Alternatives to treatment were discussed, questions were answered. Patient is willing to proceed   3. Acute on chronic systolic CHF: -Status post ICD CRT device -She does not appear grossly volume up at this time -Coreg -Not currently on ACEi/ARB/spironolactone given acute on CKD -As renal function continues to improve, could add low-dose ACEi/ARB -Diurese as  needed  4. Thrombocytopenia: -Improving  -Continue to avoid all heparin products -Patient has been anticoagulated for 24-36 hours, no indication for further anticoagulation at this time -HIT antibody pending  5. Acute on CKD stage III: -Suspected ATN from spesis -Renal function improving and stable  -Avoid nephrotoxins  -Possible cardiac cath on 12/30  6. HLD: -Recent LDL of 32 from 04/2018 -Crestor as above   For questions  or updates, please contact [place] Please consult [email or web address] for contact info under Cardiology/STEMI.    Signed, Christell Faith, PA-C [place] Pager: [phone] 10/02/2018, 7:37 AM

## 2018-10-02 NOTE — Progress Notes (Signed)
Patient's heart rate has been sustaining in 130's after using bathroom a little while ago, Dr. Tressia Miners was notified and we continued to monitor, but heart rate did not return to normal. MD ordered IV metoprolol once and an EKG. Patient is asymptomatic, resting in bed with visitors at bedside.

## 2018-10-02 NOTE — Progress Notes (Signed)
Pharmacy Antibiotic Note  [name] is a 62 y.o. female admitted on 09/28/2018 with MSSA bacteremia. Patient with past medical history significant for diabetes mellitus, coronary artery disease, cardiomyopathy, and AICD. Source of infection is likely second toe on left foot. Patient also has urine culture positive for Ecoli. Pharmacy has been consulted for cefazolin dosing. Her renal function has improved further and is now back to her baseline. She is undergoing a TEE today to evaluate for any valvular vegtation.  Plan: Continue cefazolin to 2 g IV q8h  Height: 5\' 7"  (170.2 cm) Weight: 275 lb 5.7 oz (124.9 kg) IBW/kg (Calculated) : 61.6  Temp (24hrs), Avg:98.5 F (36.9 C), Min:98.5 F (36.9 C), Max:98.6 F (37 C)  Recent Labs  Lab 09/28/18 1202  09/28/18 1213 09/28/18 1421 09/29/18 0217 09/29/18 1451 09/30/18 0540 10/01/18 0647 10/02/18 0540  WBC  --   --  10.9*  --  8.8  --  7.4 7.4 7.6  CREATININE  --    < > 1.79*  --  2.40* 2.24* 1.75* 1.25* 1.24*  LATICACIDVEN 2.93*  --   --  2.56*  --   --   --   --   --    < > = values in this interval not displayed.    Estimated Creatinine Clearance: 62.9 mL/min (A) (by C-G formula based on SCr of 1.24 mg/dL (H)).    No Known Allergies  Antimicrobials this admission: Cefepime 12/23 >> 12/24 Metronidazole 12/23 >> 12/24  Vancomycin 12/23 >> 12/24  Cefazolin 12/24 >>   Dose adjustments this admission: Increased cefazolin to 2 g IV q12h from 1 g IV q12h Increased cefazolin to 2g IV q8h due to renal improvement  Microbiology results: 12/23 BCx: MSSA 12/24 BCx: NGTD 12/24 Cdiff (-) 12/23 UCx: Ecoli - pansensitive 12/23 MRSA PCR: negative   Thank you for allowing pharmacy to be a part of this patient's care.   Dallie Piles, PharmD Clinical Pharmacist 10/02/2018 10:24 AM

## 2018-10-02 NOTE — Progress Notes (Signed)
Dr. Fletcher Anon notified of hypotension and fluid bolus.

## 2018-10-02 NOTE — Progress Notes (Addendum)
    Reviewed telemetry which shows the patient is tachycardic with heart rates in the 120s bpm. There appears to be P waves present, though these are hard to discern. I have ordered a repeat EKG to further evaluate. I will stop her Coreg, give a one time dose of IV metoprolol 5 mg, followed by placing the patient on metoprolol 12.5 mg q 6 hours. Replete potassium. Check magnesium and TSH. Will discuss with MD and have MD review telemetry as well.

## 2018-10-02 NOTE — Plan of Care (Signed)
  Problem: Health Behavior/Discharge Planning: Goal: Ability to manage health-related needs will improve Outcome: Progressing   Problem: Clinical Measurements: Goal: Respiratory complications will improve Outcome: Progressing   Problem: Activity: Goal: Risk for activity intolerance will decrease Outcome: Progressing   Problem: Nutrition: Goal: Adequate nutrition will be maintained Outcome: Progressing

## 2018-10-02 NOTE — Progress Notes (Signed)
    Case request and orders for [facility] on Monday, 10/05/2018, have been placed. We will need to continue to trend her renal function and platelet count over the next 2 days leading up to this to ensure stability of renal function and improvement in her platelets prior to proceeding with LHC. Order for CBC and BMET have been placed.

## 2018-10-02 NOTE — Progress Notes (Signed)
12-lead EKG shows sinus tachycardia. Continue to monitor. No [place] for now. Will increase her coreg to 6.25 mg bid. Reviewed with MD.

## 2018-10-02 NOTE — Progress Notes (Signed)
*  PRELIMINARY RESULTS* Echocardiogram Echocardiogram Transesophageal has been performed.  [name] 10/02/2018, 12:44 PM

## 2018-10-02 NOTE — Progress Notes (Signed)
Pharmacy Antibiotic Note  [name] is a 62 y.o. female admitted on 09/28/2018 with MSSA bacteremia. Patient with past medical history significant for diabetes mellitus, coronary artery disease, cardiomyopathy, and AICD. Source of infection is likely second toe on left foot. Patient also has urine culture positive for Ecoli. Pharmacy has been consulted for cefazolin dosing.  Plan: Renal function is improving. Continue cefazolin to 2 g IV q8h. Cefazolin also covers E. Coli UTI.  ID has mentioned adding Rifampin prophylaxis of staph biofilms. Will follow-up.  Will watch renal function daily and adjust as warranted.   Height: 5\' 7"  (170.2 cm) Weight: 275 lb 5.7 oz (124.9 kg) IBW/kg (Calculated) : 61.6  Temp (24hrs), Avg:98.5 F (36.9 C), Min:98.5 F (36.9 C), Max:98.5 F (36.9 C)  Recent Labs  Lab 09/28/18 1202  09/28/18 1213 09/28/18 1421 09/29/18 0217 09/29/18 1451 09/30/18 0540 10/01/18 0647 10/02/18 0540  WBC  --   --  10.9*  --  8.8  --  7.4 7.4 7.6  CREATININE  --    < > 1.79*  --  2.40* 2.24* 1.75* 1.25* 1.24*  LATICACIDVEN 2.93*  --   --  2.56*  --   --   --   --   --    < > = values in this interval not displayed.    Estimated Creatinine Clearance: 62.9 mL/min (A) (by C-G formula based on SCr of 1.24 mg/dL (H)).    No Known Allergies  Antimicrobials this admission: Cefepime 12/23 >> 12/24 Metronidazole 12/23 >> 12/24  Vancomycin 12/23 >> 12/24  Cefazolin 12/24 >>   Dose adjustments this admission: Increased cefazolin to 2 g IV q12h from 1 g IV q12h Increased cefazolin to 2g IV q8h due to renal improvement  Microbiology results: 12/23 BCx: MSSA pansensitive 12/24 GI panel (-) 12/24 BCx: NG x 3 days 12/24 Cdiff (-) 12/23 UCx: Ecoli - pansensitive 12/23 MRSA PCR: negative   Thank you for allowing pharmacy to be a part of this patient's care.   Paticia Stack, PharmD Pharmacy Resident  10/02/2018 8:14 AM

## 2018-10-02 NOTE — Progress Notes (Signed)
Date of Admission:  09/28/2018      ID: [name] is a 62 y.o. female  Active Problems:   Sepsis ([place]) MSSA bacteremia   Subjective: Doing much better No fever  More alert Had lunch Sitting in chair  Medications:  . aspirin EC  81 mg Oral Daily  . chlorhexidine  15 mL Mouth Rinse BID  . fentaNYL      . insulin aspart  0-20 Units Subcutaneous TID WC  . insulin aspart  0-5 Units Subcutaneous QHS  . insulin glargine  15 Units Subcutaneous Daily  . lidocaine  1 patch Transdermal Q24H  . losartan  25 mg Oral Daily  . mouth rinse  15 mL Mouth Rinse q12n4p  . metoprolol tartrate  5 mg Intravenous Once  . metoprolol tartrate  12.5 mg Oral Q6H  . midazolam      . potassium chloride  40 mEq Oral Once  . rosuvastatin  40 mg Oral q1800  . sodium chloride flush  3 mL Intravenous Q12H  . sodium chloride flush        Objective: Vital signs in last 24 hours: Temp:  [98.5 F (36.9 C)-98.6 F (37 C)] 98.6 F (37 C) (12/27 0837) Pulse Rate:  [65-131] 127 (12/27 1442) Resp:  [13-28] 22 (12/27 1226) BP: (76-142)/(47-90) 120/66 (12/27 1353) SpO2:  [90 %-98 %] 98 % (12/27 1226)  PHYSICAL EXAM:  General: Alert, cooperative, no distress, appears stated age. Sitting in chair, pale Head: Normocephalic, without obvious abnormality, atraumatic. Eyes: Conjunctivae clear, anicteric sclerae. Pupils are equal ENT Nares normal. No drainage or sinus tenderness. Lips, mucosa, and tongue normal. No Thrush Neck: Supple, symmetrical, no adenopathy, thyroid: non tender no carotid bruit and no JVD. Back: No CVA tenderness. Lungs: Clear to auscultation bilaterally. No Wheezing or Rhonchi. No rales. Heart: Regular rate and rhythm, no murmur, rub or gallop. Abdomen: Soft, non-tender,not distended. Bowel sounds normal. No masses Extremities: left 2 nd toe- nail bed has scab Toe inflamed, small opening- less ischemic Skin: No rashes or lesions. Or bruising Lymph: Cervical, supraclavicular  normal. Neurologic: Grossly non-focal  Lab Results Recent Labs    10/01/18 0647 10/02/18 0540  WBC 7.4 7.6  HGB 11.0* 10.7*  HCT 33.0* 32.8*  NA 136 136  K 3.6 3.4*  CL 105 104  CO2 24 25  BUN 47* 39*  CREATININE 1.25* 1.24*   Liver Panel Recent Labs    09/30/18 0540 10/01/18 0647  PROT 5.6* 5.5*  ALBUMIN 2.6* 2.6*  AST 100* 41  ALT 45* 26  ALKPHOS 42 41  BILITOT 0.6 0.6   Sedimentation Rate No results for input(s): ESRSEDRATE in the last 72 hours. C-Reactive Protein No results for input(s): CRP in the last 72 hours.  Microbiology:  Studies/Results: No results found.   Assessment/Plan: 62 y.o.femalewith a history ofdiabetes mellitus, coronary artery disease, cardiomyopathy, AICD, is admitted with altered mental status ,vomiting and diarrhea of 1 day duration. ? ?Staph aureus bacteremia. source second toe left foot. With AICD in place concern for vegetation of the wire or valvular vegetation, no evidence of pocket infection . TEE done today- result pending Repeat blood cultures from 12/24- NG Continue cefazolin  Encephalopathy. due to infection- resolved  Left foot second toe infection. MSSA in culture- Will need aggressive surgical management to eliminate the infection completely because of risk of infecting  the device  Diabetes mellitus uncontrolled  AKI on CKD.much improved--Combination of factors. Infection, hypovolemia.  NSTEMI-seen by cardiology- cardiac cath  on monday  Coronary artery disease with history of DES to LAD in 2013.  on aspirin.   Thrombocytopenia -? HIT -   E.coli  in the urine. Likely contamination as she haddiarrhea. S to cefazolin   ___________________________________________________  Discussed the management with the patient and her husband and Dr.Kalisetti

## 2018-10-02 NOTE — Progress Notes (Signed)
Patient's heart rate sustaining in the upper 120's again. States she can feel her heart racing. Notified Christell Faith, [place]. He will come to floor soon to check on patient.

## 2018-10-02 NOTE — Progress Notes (Signed)
Hypotensive post TEE-see flowsheet.  Patient asleep but awakens easily.  Skin pink, warm and dry.  300 ml bolus started.

## 2018-10-03 LAB — GLUCOSE, CAPILLARY
Glucose-Capillary: 221 mg/dL — ABNORMAL HIGH (ref 70–99)
Glucose-Capillary: 242 mg/dL — ABNORMAL HIGH (ref 70–99)
Glucose-Capillary: 241 mg/dL — ABNORMAL HIGH (ref 70–99)
Glucose-Capillary: 249 mg/dL — ABNORMAL HIGH (ref 70–99)

## 2018-10-03 LAB — BASIC METABOLIC PANEL
Anion gap: 9 (ref 5–15)
BUN: 31 mg/dL — ABNORMAL HIGH (ref 8–23)
CO2: 22 mmol/L (ref 22–32)
Calcium: 7.5 mg/dL — ABNORMAL LOW (ref 8.9–10.3)
Chloride: 101 mmol/L (ref 98–111)
Creatinine, Ser: 1.17 mg/dL — ABNORMAL HIGH (ref 0.44–1.00)
GFR calc Af Amer: 57 mL/min — ABNORMAL LOW (ref >60)
GFR, EST NON AFRICAN AMERICAN: 49 mL/min — AB (ref >60)
GLUCOSE: 208 mg/dL — AB (ref 70–99)
POTASSIUM: 3.6 mmol/L (ref 3.5–5.1)
Sodium: 132 mmol/L — ABNORMAL LOW (ref 135–145)

## 2018-10-03 LAB — CBC
HCT: 32.8 % — ABNORMAL LOW (ref 36.0–46.0)
Hemoglobin: 10.7 g/dL — ABNORMAL LOW (ref 12.0–15.0)
MCH: 29.6 pg (ref 26.0–34.0)
MCHC: 32.6 g/dL (ref 30.0–36.0)
MCV: 90.9 fL (ref 80.0–100.0)
Platelets: 80 10*3/uL — ABNORMAL LOW (ref 150–400)
RBC: 3.61 MIL/uL — ABNORMAL LOW (ref 3.87–5.11)
RDW: 13.1 % (ref 11.5–15.5)
WBC: 10.2 10*3/uL (ref 4.0–10.5)
nRBC: 0 % (ref 0.0–0.2)

## 2018-10-03 MED ORDER — INSULIN GLARGINE 100 UNIT/ML ~~LOC~~ SOLN
20.0000 [IU] | Freq: Every day | SUBCUTANEOUS | Status: DC
  Administered 2018-10-04 – 2018-10-09 (×5): 20 [IU] via SUBCUTANEOUS
  Filled 2018-10-03 (×6): qty 0.2

## 2018-10-03 MED ORDER — INSULIN ASPART 100 UNIT/ML ~~LOC~~ SOLN
3.0000 [IU] | Freq: Three times a day (TID) | SUBCUTANEOUS | Status: DC
  Administered 2018-10-03 – 2018-10-09 (×13): 3 [IU] via SUBCUTANEOUS
  Filled 2018-10-03 (×14): qty 1

## 2018-10-03 NOTE — Progress Notes (Signed)
[facility] NAME: [name]    MR#:  [id number]  DATE OF BIRTH:  [birth year]  SUBJECTIVE:  CHIEF COMPLAINT:   Chief Complaint  Patient presents with  . Code Sepsis   -Still has back pain.  Feels much better though -TEE negative.  Platelets are improving -Plan for cardiac cath on Monday  REVIEW OF SYSTEMS:  Review of Systems  Constitutional: Negative for chills, fever and malaise/fatigue.  HENT: Negative for congestion, ear discharge, hearing loss and nosebleeds.   Eyes: Negative for blurred vision and double vision.  Respiratory: Negative for cough and shortness of breath.   Cardiovascular: Negative for chest pain, palpitations and leg swelling.  Gastrointestinal: Negative for abdominal pain, heartburn, nausea and vomiting.  Genitourinary: Negative for dysuria and urgency.  Musculoskeletal: Positive for back pain and myalgias.  Neurological: Negative for dizziness, focal weakness, weakness and headaches.  Psychiatric/Behavioral: Negative for depression.    DRUG ALLERGIES:  No Known Allergies  VITALS:  Blood pressure (!) 148/62, pulse 75, temperature 98.6 F (37 C), temperature source Oral, resp. rate 20, height 5\' 7"  (1.702 m), weight 124.9 kg, SpO2 91 %.  PHYSICAL EXAMINATION:  Physical Exam   GENERAL:  62 y.o.-year-old obese patient lying in the bed with no acute distress.  EYES: Pupils equal, round, reactive to light and accommodation. No scleral icterus. Extraocular muscles intact.  HEENT: Head atraumatic, normocephalic. Oropharynx and nasopharynx clear.  NECK:  Supple, no jugular venous distention. No thyroid enlargement, no tenderness.  LUNGS: Normal breath sounds bilaterally, no wheezing, rales,rhonchi or crepitation. No use of accessory muscles of respiration. Decreased bibasilar breath sounds CARDIOVASCULAR: S1, S2 normal. No  rubs, or gallops. 2/6 systolic murmur present ICD on left chest ABDOMEN:  Soft, nontender, nondistended. Bowel sounds present. No organomegaly or mass.  EXTREMITIES: No pedal edema, cyanosis, or clubbing.  Left foot second toe swelling, decreased sensation but improved color NEUROLOGIC: Cranial nerves II through XII are intact. Muscle strength 5/5 in all extremities. Sensation intact. Gait not checked. Global weakness noted. PSYCHIATRIC: The patient is alert and oriented x 3.  SKIN: No obvious rash, lesion, or ulcer.    LABORATORY PANEL:   CBC Recent Labs  Lab 10/03/18 0418  WBC 10.2  HGB 10.7*  HCT 32.8*  PLT 80*   ------------------------------------------------------------------------------------------------------------------  Chemistries  Recent Labs  Lab 10/01/18 0647  10/02/18 1513 10/03/18 0418  NA 136   < >  --  132*  K 3.6   < >  --  3.6  CL 105   < >  --  101  CO2 24   < >  --  22  GLUCOSE 162*   < >  --  208*  BUN 47*   < >  --  31*  CREATININE 1.25*   < >  --  1.17*  CALCIUM 8.0*   < >  --  7.5*  MG  --   --  1.8  --   AST 41  --   --   --   ALT 26  --   --   --   ALKPHOS 41  --   --   --   BILITOT 0.6  --   --   --    < > = values in this interval not displayed.   ------------------------------------------------------------------------------------------------------------------  Cardiac Enzymes Recent Labs  Lab 09/29/18 0217  TROPONINI 24.26*   ------------------------------------------------------------------------------------------------------------------  RADIOLOGY:  No results found.  EKG:   Orders placed or performed during the hospital encounter of 09/28/18  . ED EKG 12-Lead  . ED EKG 12-Lead  . EKG 12-Lead  . EKG 12-Lead  . EKG 12-Lead  . EKG 12-Lead  . EKG 12-Lead  . EKG 12-Lead  . EKG 12-Lead  . EKG 12-Lead  . EKG 12-Lead  . EKG 12-Lead  . EKG 12-Lead  . EKG 12-Lead  . EKG 12-Lead  . EKG 12-Lead  . EKG 12-Lead  . EKG 12-Lead  . EKG 12-Lead  . EKG 12-Lead    ASSESSMENT AND PLAN:    62 year old female with past medical history significant for CAD status post PCI, chronic systolic CHF status post ICD, ischemic cardiomyopathy, diabetes, hypertension, obesity, sleep apnea on CPAP admitted for MSSA bacteremia and left foot second toe ulceration.  1.  Staph aureus bacteremia-blood culture showing MSSA, currently on IV Ancef -His left foot second toe infection. -Repeat blood cultures from 09/29/2018 are negative so far.  Appreciate ID consult. -2D echo without any vegetations.  TEE done on 10/02/2018 showing no vegetations, and the ICD wires do not look infected.. -Afebrile at this time, WBC is within normal limits.  2.  NSTEMI-troponins are significantly elevated at 24. -Initially started on heparin drip, however drop in platelets noted.  So heparin is discontinued. -Denies any chest pain. - known CAD history.  On aspirin, statin, Coreg and losartan -Appreciate cardiology consult.  Since platelets are improving, plan for cardiac catheterization on 10/05/2018  3.  Left foot second toe infection-cultures are growing MSSA -Appreciate podiatry consult.  Once cardiac issues are resolved, management per podiatry on this wound about debridement  4.  Acute renal failure on CKD-avoid nephrotoxins.  Creatinine is much improved and stable at 1.2  5.  Chronic systolic CHF-euvolemic at this time.  Diuretics on hold given renal failure -Continue cardiac medications including losartan, aspirin, Coreg and statin.  Entresto as outpatient if blood pressure permits  6.  Diabetes mellitus-on Lantus and sliding scale insulin.  7.  Thrombocytopenia-acute after heparin was started.  HIT antibody testing is pending -On aspirin only.  Avoid heparin products for now. -Stable at this time.  Monitor daily  8.  DVT prophylaxis-teds and SCDs  Physical therapy consulted today  All the records are reviewed and case discussed with Care Management/Social Workerr. Management plans discussed with  the patient, family and they are in agreement.  CODE STATUS: Full Code  TOTAL TIME TAKING CARE OF THIS PATIENT: 36 minutes.   POSSIBLE D/C IN 5-6 DAYS, DEPENDING ON CLINICAL CONDITION.   Gladstone Lighter M.D on 10/03/2018 at 9:59 AM  Between 7am to 6pm - Pager - [phone]  After 6pm go to [email or web address] - password [email or web address] [facility]  Office  [phone]  CC: Primary care physician; Rutherford Guys, MD

## 2018-10-03 NOTE — Progress Notes (Signed)
1 Day Post-Op   Subjective/Chief Complaint: Patient seen.  No complaints with her toe.   Objective: Vital signs in last 24 hours: Temp:  [98.4 F (36.9 C)-98.7 F (37.1 C)] 98.6 F (37 C) (12/28 0311) Pulse Rate:  [70-128] 75 (12/28 0842) Resp:  [20] 20 (12/28 0311) BP: (76-151)/(51-90) 148/62 (12/28 0842) SpO2:  [91 %-96 %] 91 % (12/28 0311) Last BM Date: 10/02/18  Intake/Output from previous day: 12/27 0701 - 12/28 0700 In: 102 [I.V.:102] Out: 425 [Urine:425] Intake/Output this shift: No intake/output data recorded.  Still some significant edema and inflammation in the left second toe.  No active drainage from the ulcerative area.  Lab Results:  Recent Labs    10/02/18 0540 10/03/18 0418  WBC 7.6 10.2  HGB 10.7* 10.7*  HCT 32.8* 32.8*  PLT 63* 80*   BMET Recent Labs    10/02/18 0540 10/03/18 0418  NA 136 132*  K 3.4* 3.6  CL 104 101  CO2 25 22  GLUCOSE 157* 208*  BUN 39* 31*  CREATININE 1.24* 1.17*  CALCIUM 8.0* 7.5*   PT/INR No results for input(s): LABPROT, INR in the last 72 hours. ABG No results for input(s): PHART, HCO3 in the last 72 hours.  Invalid input(s): PCO2, PO2  Studies/Results: No results found.  Anti-infectives: Anti-infectives (From admission, onward)   Start     Dose/Rate Route Frequency Ordered Stop   10/01/18 0900  ceFAZolin (ANCEF) IVPB 2g/100 mL premix     2 g 200 mL/hr over 30 Minutes Intravenous Every 8 hours 10/01/18 0813     09/30/18 2200  ceFAZolin (ANCEF) IVPB 2g/100 mL premix  Status:  Discontinued     2 g 200 mL/hr over 30 Minutes Intravenous Every 12 hours 09/30/18 1036 10/01/18 0813   09/29/18 2200  ceFAZolin (ANCEF) IVPB 1 g/50 mL premix  Status:  Discontinued     1 g 100 mL/hr over 30 Minutes Intravenous Every 12 hours 09/29/18 1505 09/30/18 1036   09/29/18 1000  ceFAZolin (ANCEF) IVPB 2g/100 mL premix  Status:  Discontinued     2 g 200 mL/hr over 30 Minutes Intravenous Every 8 hours 09/29/18 0921 09/29/18  1505   09/29/18 0000  ceFEPIme (MAXIPIME) 2 g in sodium chloride 0.9 % 100 mL IVPB  Status:  Discontinued     2 g 200 mL/hr over 30 Minutes Intravenous Every 12 hours 09/28/18 1318 09/29/18 0919   09/28/18 1830  vancomycin (VANCOCIN) 1,250 mg in sodium chloride 0.9 % 250 mL IVPB  Status:  Discontinued     1,250 mg 166.7 mL/hr over 90 Minutes Intravenous Every 18 hours 09/28/18 1318 09/29/18 1235   09/28/18 1200  ceFEPIme (MAXIPIME) 2 g in sodium chloride 0.9 % 100 mL IVPB     2 g 200 mL/hr over 30 Minutes Intravenous  Once 09/28/18 1156 09/28/18 1301   09/28/18 1200  metroNIDAZOLE (FLAGYL) IVPB 500 mg  Status:  Discontinued     500 mg 100 mL/hr over 60 Minutes Intravenous Every 8 hours 09/28/18 1156 09/29/18 0919   09/28/18 1200  vancomycin (VANCOCIN) IVPB 1000 mg/200 mL premix     1,000 mg 200 mL/hr over 60 Minutes Intravenous  Once 09/28/18 1156 09/28/18 1339      Assessment/Plan: s/p Procedure(s): TRANSESOPHAGEAL ECHOCARDIOGRAM (TEE) (N/A) Assessment: Osteomyelitis left second toe.   Plan: Wet to dry dressing reapplied to the second toe.  Discussed with the patient that at this point I do think she will still require an amputation of  the second toe once she is medically stable.  Discussed with the patient that most likely it will be later next week with Dr. Elvina Mattes who is on-call at that time.  LOS: 5 days    Durward Fortes 10/03/2018

## 2018-10-03 NOTE — Progress Notes (Signed)
Progress Note  Patient Name: [name] Date of Encounter: 10/03/2018  Primary Cardiologist: Rockey Situ Primary Electrophysiologist: Caryl Comes  Subjective   Pain or shortness of breath.  Feeling well without major complaints today.  Inpatient Medications    Scheduled Meds: . aspirin EC  81 mg Oral Daily  . chlorhexidine  15 mL Mouth Rinse BID  . insulin aspart  0-20 Units Subcutaneous TID WC  . insulin aspart  0-5 Units Subcutaneous QHS  . insulin glargine  15 Units Subcutaneous Daily  . lidocaine  1 patch Transdermal Q24H  . mouth rinse  15 mL Mouth Rinse q12n4p  . metoprolol tartrate  25 mg Oral Q6H  . rosuvastatin  40 mg Oral q1800  . sodium chloride flush  3 mL Intravenous Q12H   Continuous Infusions: . sodium chloride Stopped (10/02/18 0322)  .  ceFAZolin (ANCEF) IV 2 g (10/03/18 0312)   PRN Meds: sodium chloride, acetaminophen **OR** acetaminophen, [DISCONTINUED] ondansetron **OR** ondansetron (ZOFRAN) IV, promethazine, sodium chloride flush, traMADol   Vital Signs    Vitals:   10/02/18 1649 10/02/18 1928 10/03/18 0311 10/03/18 0842  BP: 126/72 (!) 115/51 (!) 151/54 (!) 148/62  Pulse: (!) 128 75 70 75  Resp: 20 20 20    Temp: 98.4 F (36.9 C) 98.7 F (37.1 C) 98.6 F (37 C)   TempSrc: Oral Oral Oral   SpO2: 94% 96% 91%   Weight:      Height:        Intake/Output Summary (Last 24 hours) at 10/03/2018 1209 Last data filed at 10/03/2018 0400 Gross per 24 hour  Intake 102 ml  Output 425 ml  Net -323 ml   Filed Weights   09/28/18 1158 09/28/18 1711 09/29/18 0500  Weight: 130 kg 124.9 kg 124.9 kg    Telemetry    AV paced- Personally Reviewed  ECG    On his tachycardia, intermittent ventricular pacing- Personally Reviewed  Physical Exam   GEN: Well nourished, well developed, in no acute distress  HEENT: normal  Neck: no JVD, carotid bruits, or masses Cardiac: RRR; 2 out of 6 stock murmur at the base, no rubs, or gallops,no edema    Respiratory:  clear to auscultation bilaterally, normal work of breathing GI: soft, nontender, nondistended, + BS MS: no deformity or atrophy  Skin: warm and dry, device site well healed, second toe on the right foot, purple and black Neuro:  Strength and sensation are intact Psych: euthymic mood, full affect   Labs    Chemistry Recent Labs  Lab 09/29/18 0217  09/30/18 0540 10/01/18 0647 10/02/18 0540 10/03/18 0418  NA 136   < > 137 136 136 132*  K 3.5   < > 3.4* 3.6 3.4* 3.6  CL 105   < > 105 105 104 101  CO2 23   < > 22 24 25 22   GLUCOSE 325*   < > 167* 162* 157* 208*  BUN 58*   < > 59* 47* 39* 31*  CREATININE 2.40*   < > 1.75* 1.25* 1.24* 1.17*  CALCIUM 7.9*   < > 8.0* 8.0* 8.0* 7.5*  PROT 6.0*  --  5.6* 5.5*  --   --   ALBUMIN 2.9*  --  2.6* 2.6*  --   --   AST 103*  --  100* 41  --   --   ALT 38  --  45* 26  --   --   ALKPHOS 40  --  42 41  --   --  BILITOT 0.8  --  0.6 0.6  --   --   GFRNONAA 21*   < > 30* 45* 46* 49*  GFRAA 24*   < > 35* 53* 53* 57*  ANIONGAP 8   < > 10 7 7 9    < > = values in this interval not displayed.     Hematology Recent Labs  Lab 10/01/18 0647 10/02/18 0540 10/03/18 0418  WBC 7.4 7.6 10.2  RBC 3.70* 3.65* 3.61*  HGB 11.0* 10.7* 10.7*  HCT 33.0* 32.8* 32.8*  MCV 89.2 89.9 90.9  MCH 29.7 29.3 29.6  MCHC 33.3 32.6 32.6  RDW 13.2 13.2 13.1  PLT 61* 63* 80*    Cardiac Enzymes Recent Labs  Lab 09/28/18 1213 09/28/18 1725 09/28/18 1902 09/29/18 0217  TROPONINI 0.35* 2.89* 5.20* 24.26*   No results for input(s): TROPIPOC in the last 168 hours.   BNP Recent Labs  Lab 09/29/18 0217  BNP 1,758.0*     DDimer No results for input(s): DDIMER in the last 168 hours.   Radiology    No results found.  Cardiac Studies   Echo 09/29/2018: Study Conclusions  - Left ventricle: The cavity size was normal. Systolic function was mildly reduced. The estimated ejection fraction was in the range of 45% to 50%. The study is  not technically sufficient to allow evaluation of LV diastolic function. - Left atrium: The atrium was mildly dilated.  Impressions:  - Very suboptimal study. No evidence of vegetations. Consider TEE.  Patient Profile     62 y.o. female with history of multivessel CAD with CTO of the LAD which was treated with PCI at that time but the distal vessel was small in size and there was diffuse dissection that was treated with PTCA. She also has history of chronic systolic CHF s/p ICD CRT in 2015 with subsequent normalization of EF, osteomyelitis, DM2, HTN, obesity and OSA on CPAP who we are seeing for NSTEMI and MSSA bacteremia.   Assessment & Plan    1. NSTEMI: [name] chest pain-free.  Troponin up to 24.  Planning left heart catheterization 10/05/2018 pending platelet count and renal function.  2. MSSA bacteremia: Patient is status post CRT-D currently getting antibiotics per ID.  TEE shows no evidence of vegetation.  Am concerned that with her MSSA, she Eleazar Kimmey require device extraction.  Would be interested to see what infectious disease has to say about this given TEE without vegetation.  He may require long-term suppressive antibiotics pending kidney function and platelet count.  3. Acute on chronic systolic CHF: Currently holding ACE inhibitor/ARB due to kidney function.  Is status post CRT-D.  4. Thrombocytopenia: HIT antibody pending.  Currently holding heparin.  Platelets improving.  5. Acute on CKD stage III: Suspect ATN from sepsis.  Possible cath 12/30  6. HLD: Continue Crestor   For questions or updates, please contact [facility] Please consult [email or web address] for contact info under Cardiology/STEMI.    Signed, Christell Faith, PA-C [place] Pager: [phone] 10/03/2018, 12:09 PM

## 2018-10-04 LAB — BASIC METABOLIC PANEL
Anion gap: 9 (ref 5–15)
BUN: 29 mg/dL — ABNORMAL HIGH (ref 8–23)
CHLORIDE: 103 mmol/L (ref 98–111)
CO2: 23 mmol/L (ref 22–32)
Calcium: 7.7 mg/dL — ABNORMAL LOW (ref 8.9–10.3)
Creatinine, Ser: 1.2 mg/dL — ABNORMAL HIGH (ref 0.44–1.00)
GFR calc Af Amer: 55 mL/min — ABNORMAL LOW (ref >60)
GFR calc non Af Amer: 48 mL/min — ABNORMAL LOW (ref >60)
Glucose, Bld: 190 mg/dL — ABNORMAL HIGH (ref 70–99)
Potassium: 3.6 mmol/L (ref 3.5–5.1)
Sodium: 135 mmol/L (ref 135–145)

## 2018-10-04 LAB — CULTURE, BLOOD (ROUTINE X 2)
Culture: NO GROWTH
Culture: NO GROWTH
Special Requests: ADEQUATE

## 2018-10-04 LAB — GLUCOSE, CAPILLARY
Glucose-Capillary: 215 mg/dL — ABNORMAL HIGH (ref 70–99)
Glucose-Capillary: 260 mg/dL — ABNORMAL HIGH (ref 70–99)
Glucose-Capillary: 166 mg/dL — ABNORMAL HIGH (ref 70–99)
Glucose-Capillary: 183 mg/dL — ABNORMAL HIGH (ref 70–99)

## 2018-10-04 LAB — CBC
HCT: 31.6 % — ABNORMAL LOW (ref 36.0–46.0)
Hemoglobin: 10.4 g/dL — ABNORMAL LOW (ref 12.0–15.0)
MCH: 29.4 pg (ref 26.0–34.0)
MCHC: 32.9 g/dL (ref 30.0–36.0)
MCV: 89.3 fL (ref 80.0–100.0)
Platelets: 123 10*3/uL — ABNORMAL LOW (ref 150–400)
RBC: 3.54 MIL/uL — ABNORMAL LOW (ref 3.87–5.11)
RDW: 13 % (ref 11.5–15.5)
WBC: 8.2 10*3/uL (ref 4.0–10.5)
nRBC: 0 % (ref 0.0–0.2)

## 2018-10-04 MED ORDER — SODIUM CHLORIDE 0.9% FLUSH: 3.0000 mL | INTRAVENOUS | Status: DC | PRN

## 2018-10-04 MED ORDER — SODIUM CHLORIDE 0.9 % IV SOLN
INTRAVENOUS | Status: DC
  Administered 2018-10-04: 17:00:00 via INTRAVENOUS

## 2018-10-04 MED ORDER — METHOCARBAMOL 500 MG PO TABS
500.0000 mg | ORAL_TABLET | Freq: Three times a day (TID) | ORAL | Status: AC
  Administered 2018-10-04 – 2018-10-06 (×7): 500 mg via ORAL
  Filled 2018-10-04 (×9): qty 1

## 2018-10-04 MED ORDER — SODIUM CHLORIDE 0.9% FLUSH
3.0000 mL | Freq: Two times a day (BID) | INTRAVENOUS | Status: DC
  Administered 2018-10-04: 3 mL via INTRAVENOUS

## 2018-10-04 MED ORDER — SODIUM CHLORIDE 0.9 % IV SOLN: 250.0000 mL | INTRAVENOUS | Status: DC | PRN

## 2018-10-04 NOTE — Progress Notes (Signed)
[facility] NAME: [name]    MR#:  [id number]  DATE OF BIRTH:  [birth year]  SUBJECTIVE:  CHIEF COMPLAINT:   Chief Complaint  Patient presents with  . Code Sepsis   -Sitting in the chair today.  Feels much better -For cardiac cath tomorrow -Still has back pain  REVIEW OF SYSTEMS:  Review of Systems  Constitutional: Negative for chills, fever and malaise/fatigue.  HENT: Negative for congestion, ear discharge, hearing loss and nosebleeds.   Eyes: Negative for blurred vision and double vision.  Respiratory: Negative for cough and shortness of breath.   Cardiovascular: Negative for chest pain, palpitations and leg swelling.  Gastrointestinal: Negative for abdominal pain, heartburn, nausea and vomiting.  Genitourinary: Negative for dysuria and urgency.  Musculoskeletal: Positive for back pain and myalgias.  Neurological: Negative for dizziness, focal weakness, weakness and headaches.  Psychiatric/Behavioral: Negative for depression.    DRUG ALLERGIES:  No Known Allergies  VITALS:  Blood pressure (!) 147/64, pulse (!) 59, temperature 98.2 F (36.8 C), temperature source Oral, resp. rate 18, height 5\' 7"  (1.702 m), weight 128.4 kg, SpO2 92 %.  PHYSICAL EXAMINATION:  Physical Exam   GENERAL:  62 y.o.-year-old obese patient sitting in the chair with no acute distress.  EYES: Pupils equal, round, reactive to light and accommodation. No scleral icterus. Extraocular muscles intact.  HEENT: Head atraumatic, normocephalic. Oropharynx and nasopharynx clear.  NECK:  Supple, no jugular venous distention. No thyroid enlargement, no tenderness.  LUNGS: Normal breath sounds bilaterally, no wheezing, rales,rhonchi or crepitation. No use of accessory muscles of respiration. Decreased bibasilar breath sounds CARDIOVASCULAR: S1, S2 normal. No  rubs, or gallops. 2/6 systolic murmur present ICD on left chest ABDOMEN: Soft, nontender,  nondistended. Bowel sounds present. No organomegaly or mass.  EXTREMITIES: No pedal edema, cyanosis, or clubbing.  Left foot second toe swelling, decreased sensation but improved color NEUROLOGIC: Cranial nerves II through XII are intact. Muscle strength 5/5 in all extremities. Sensation intact. Gait not checked. Global weakness noted. PSYCHIATRIC: The patient is alert and oriented x 3.  SKIN: No obvious rash, lesion, or ulcer.    LABORATORY PANEL:   CBC Recent Labs  Lab 10/04/18 0434  WBC 8.2  HGB 10.4*  HCT 31.6*  PLT 123*   ------------------------------------------------------------------------------------------------------------------  Chemistries  Recent Labs  Lab 10/01/18 0647  10/02/18 1513  10/04/18 0434  NA 136   < >  --    < > 135  K 3.6   < >  --    < > 3.6  CL 105   < >  --    < > 103  CO2 24   < >  --    < > 23  GLUCOSE 162*   < >  --    < > 190*  BUN 47*   < >  --    < > 29*  CREATININE 1.25*   < >  --    < > 1.20*  CALCIUM 8.0*   < >  --    < > 7.7*  MG  --   --  1.8  --   --   AST 41  --   --   --   --   ALT 26  --   --   --   --   ALKPHOS 41  --   --   --   --   BILITOT 0.6  --   --   --   --    < > =  values in this interval not displayed.   ------------------------------------------------------------------------------------------------------------------  Cardiac Enzymes Recent Labs  Lab 09/29/18 0217  TROPONINI 24.26*   ------------------------------------------------------------------------------------------------------------------  RADIOLOGY:  No results found.  EKG:   Orders placed or performed during the hospital encounter of 09/28/18  . ED EKG 12-Lead  . ED EKG 12-Lead  . EKG 12-Lead  . EKG 12-Lead  . EKG 12-Lead  . EKG 12-Lead  . EKG 12-Lead  . EKG 12-Lead  . EKG 12-Lead  . EKG 12-Lead  . EKG 12-Lead  . EKG 12-Lead  . EKG 12-Lead  . EKG 12-Lead  . EKG 12-Lead  . EKG 12-Lead  . EKG 12-Lead  . EKG 12-Lead  . EKG 12-Lead   . EKG 12-Lead    ASSESSMENT AND PLAN:   62 year old female with past medical history significant for CAD status post PCI, chronic systolic CHF status post ICD, ischemic cardiomyopathy, diabetes, hypertension, obesity, sleep apnea on CPAP admitted for MSSA bacteremia and left foot second toe ulceration.  1.  Staph aureus bacteremia-blood culture showing MSSA, currently on IV Ancef -Has left foot second toe infection. -Repeat blood cultures from 09/29/2018 are negative so far.  Appreciate ID consult. -2D echo without any vegetations.  TEE done on 10/02/2018 showing no vegetations, and the ICD wires do not look infected.. -Afebrile at this time, WBC is within normal limits. -We will need her left foot second toe amputation done and the bone cultures to see if she had osteomyelitis or not.  That will determine her duration of antibiotics.  2.  NSTEMI-troponins are significantly elevated at 24. -Initially started on heparin drip, however drop in platelets noted.  So heparin is discontinued. -Denies any chest pain. - known CAD history.  On aspirin, statin, Coreg and losartan -Appreciate cardiology consult.  Since platelets are improving, plan for cardiac catheterization on 10/05/2018  3.  Left foot second toe infection-cultures are growing MSSA -Appreciate podiatry consult.  Once cardiac issues are resolved, patient will need amputation of third toe next week.  4.  Acute renal failure on CKD-avoid nephrotoxins.  Creatinine is much improved and stable   5.  Chronic systolic CHF-euvolemic at this time.  Diuretics on hold given renal failure -Continue cardiac medications including losartan, aspirin, Coreg and statin.  Entresto as outpatient if blood pressure permits  6.  Diabetes mellitus-on Lantus and sliding scale insulin.  7.  Thrombocytopenia-acute after heparin was started.  HIT antibody testing is pending -On aspirin only.  Avoid heparin products for now. -Stable at this time.  Monitor  daily  8.  DVT prophylaxis-teds and SCDs  Physical therapy consulted    All the records are reviewed and case discussed with Care Management/Social Workerr. Management plans discussed with the patient, family and they are in agreement.  CODE STATUS: Full Code  TOTAL TIME TAKING CARE OF THIS PATIENT: 29 minutes.   POSSIBLE D/C IN 5-6 DAYS, DEPENDING ON CLINICAL CONDITION.   Gladstone Lighter M.D on 10/04/2018 at 10:39 AM  Between 7am to 6pm - Pager - [phone]  After 6pm go to [email or web address] - password [email or web address] [facility]  Office  [phone]  CC: Primary care physician; Rutherford Guys, MD

## 2018-10-05 ENCOUNTER — Encounter: Payer: Self-pay | Admitting: Cardiovascular Disease

## 2018-10-05 ENCOUNTER — Encounter
Admission: EM | Disposition: A | Discharge status: Home Health | Payer: Self-pay | Source: Home / Self Care | Attending: Internal Medicine

## 2018-10-05 DIAGNOSIS — J9602 Acute respiratory failure with hypercapnia: Secondary | ICD-10-CM

## 2018-10-05 DIAGNOSIS — I25118 Atherosclerotic heart disease of native coronary artery with other forms of angina pectoris: Secondary | ICD-10-CM

## 2018-10-05 HISTORY — PX: LEFT HEART CATH AND CORONARY ANGIOGRAPHY: CATH118249

## 2018-10-05 LAB — GLUCOSE, CAPILLARY
GLUCOSE-CAPILLARY: 239 mg/dL — AB (ref 70–99)
GLUCOSE-CAPILLARY: 213 mg/dL — AB (ref 70–99)
Glucose-Capillary: 156 mg/dL — ABNORMAL HIGH (ref 70–99)
Glucose-Capillary: 153 mg/dL — ABNORMAL HIGH (ref 70–99)
Glucose-Capillary: 209 mg/dL — ABNORMAL HIGH (ref 70–99)
Glucose-Capillary: 286 mg/dL — ABNORMAL HIGH (ref 70–99)

## 2018-10-05 LAB — AEROBIC/ANAEROBIC CULTURE W GRAM STAIN (SURGICAL/DEEP WOUND)

## 2018-10-05 LAB — BASIC METABOLIC PANEL
Anion gap: 9 (ref 5–15)
BUN: 27 mg/dL — ABNORMAL HIGH (ref 8–23)
CO2: 24 mmol/L (ref 22–32)
Calcium: 7.9 mg/dL — ABNORMAL LOW (ref 8.9–10.3)
Chloride: 101 mmol/L (ref 98–111)
Creatinine, Ser: 1.34 mg/dL — ABNORMAL HIGH (ref 0.44–1.00)
GFR calc Af Amer: 48 mL/min — ABNORMAL LOW (ref >60)
GFR calc non Af Amer: 42 mL/min — ABNORMAL LOW (ref >60)
Glucose, Bld: 168 mg/dL — ABNORMAL HIGH (ref 70–99)
Potassium: 4.1 mmol/L (ref 3.5–5.1)
Sodium: 134 mmol/L — ABNORMAL LOW (ref 135–145)

## 2018-10-05 LAB — CBC
HEMATOCRIT: 33.1 % — AB (ref 36.0–46.0)
Hemoglobin: 10.9 g/dL — ABNORMAL LOW (ref 12.0–15.0)
MCH: 29.9 pg (ref 26.0–34.0)
MCHC: 32.9 g/dL (ref 30.0–36.0)
MCV: 90.7 fL (ref 80.0–100.0)
Platelets: 165 10*3/uL (ref 150–400)
RBC: 3.65 MIL/uL — ABNORMAL LOW (ref 3.87–5.11)
RDW: 12.9 % (ref 11.5–15.5)
WBC: 7.3 10*3/uL (ref 4.0–10.5)
nRBC: 0 % (ref 0.0–0.2)

## 2018-10-05 SURGERY — LEFT HEART CATH AND CORONARY ANGIOGRAPHY
Anesthesia: Moderate Sedation

## 2018-10-05 MED ORDER — FENTANYL CITRATE (PF) 100 MCG/2ML IJ SOLN
INTRAMUSCULAR | Status: DC | PRN
  Administered 2018-10-05 (×2): 25 ug via INTRAVENOUS

## 2018-10-05 MED ORDER — LOSARTAN POTASSIUM 25 MG PO TABS
25.0000 mg | ORAL_TABLET | Freq: Every day | ORAL | Status: DC
  Administered 2018-10-05 – 2018-10-09 (×5): 25 mg via ORAL
  Filled 2018-10-05 (×5): qty 1

## 2018-10-05 MED ORDER — MIDAZOLAM HCL 2 MG/2ML IJ SOLN
INTRAMUSCULAR | Status: DC | PRN
  Administered 2018-10-05 (×2): 1 mg via INTRAVENOUS

## 2018-10-05 MED ORDER — METOPROLOL TARTRATE 5 MG/5ML IV SOLN
INTRAVENOUS | Status: AC
  Filled 2018-10-05: qty 5

## 2018-10-05 MED ORDER — HEPARIN (PORCINE) IN NACL 1000-0.9 UT/500ML-% IV SOLN
INTRAVENOUS | Status: AC
  Filled 2018-10-05: qty 1000

## 2018-10-05 MED ORDER — ADENOSINE (DIAGNOSTIC) 3 MG/ML IV SOLN
INTRAVENOUS | Status: AC
  Filled 2018-10-05: qty 30

## 2018-10-05 MED ORDER — AMIODARONE HCL IN DEXTROSE 360-4.14 MG/200ML-% IV SOLN
30.0000 mg/h | INTRAVENOUS | Status: DC
  Administered 2018-10-05 – 2018-10-07 (×5): 30 mg/h via INTRAVENOUS
  Filled 2018-10-05 (×7): qty 200

## 2018-10-05 MED ORDER — IOPAMIDOL (ISOVUE-300) INJECTION 61%
INTRAVENOUS | Status: DC | PRN
  Administered 2018-10-05: 95 mL via INTRAVENOUS

## 2018-10-05 MED ORDER — ADENOSINE 6 MG/2ML IV SOLN
INTRAVENOUS | Status: DC | PRN
  Administered 2018-10-05: 6 mg via INTRAVENOUS

## 2018-10-05 MED ORDER — FENTANYL CITRATE (PF) 100 MCG/2ML IJ SOLN
INTRAMUSCULAR | Status: AC
  Filled 2018-10-05: qty 2

## 2018-10-05 MED ORDER — METOPROLOL TARTRATE 5 MG/5ML IV SOLN
INTRAVENOUS | Status: DC | PRN
  Administered 2018-10-05 (×2): 5 mg via INTRAVENOUS

## 2018-10-05 MED ORDER — MIDAZOLAM HCL 2 MG/2ML IJ SOLN
INTRAMUSCULAR | Status: AC
  Filled 2018-10-05: qty 2

## 2018-10-05 MED ORDER — AMIODARONE LOAD VIA INFUSION
150.0000 mg | Freq: Once | INTRAVENOUS | Status: AC
  Administered 2018-10-05: 150 mg via INTRAVENOUS
  Filled 2018-10-05: qty 83.34

## 2018-10-05 MED ORDER — AMIODARONE HCL IN DEXTROSE 360-4.14 MG/200ML-% IV SOLN
60.0000 mg/h | INTRAVENOUS | Status: AC
  Administered 2018-10-05 (×2): 60 mg/h via INTRAVENOUS
  Filled 2018-10-05: qty 200

## 2018-10-05 SURGICAL SUPPLY — 15 items
CATH INFINITI 5FR ANG PIGTAIL (CATHETERS) ×2 IMPLANT
CATH INFINITI 5FR JL4 (CATHETERS) ×2 IMPLANT
CATH INFINITI JR4 5F (CATHETERS) ×2 IMPLANT
DEVICE CLOSURE MYNXGRIP 5F (Vascular Products) ×2 IMPLANT
DILATOR VESSEL 38 20CM 6.0FR (INTRODUCER) ×2 IMPLANT
KIT MANI 3VAL PERCEP (MISCELLANEOUS) ×3 IMPLANT
NDL PERC 18GX7CM (NEEDLE) IMPLANT
NDL SMART REG 18GX2-3/4 (NEEDLE) IMPLANT
NEEDLE PERC 18GX7CM (NEEDLE) ×3 IMPLANT
NEEDLE SMART REG 18GX2-3/4 (NEEDLE) ×3 IMPLANT
PACK CARDIAC CATH (CUSTOM PROCEDURE TRAY) ×3 IMPLANT
SET INTRO CAPELLA COAXIAL (SET/KITS/TRAYS/PACK) ×2 IMPLANT
SHEATH AVANTI 5FR X 11CM (SHEATH) ×6 IMPLANT
SHEATH AVANTI 6FR X 11CM (SHEATH) ×2 IMPLANT
WIRE GUIDERIGHT .035X150 (WIRE) ×2 IMPLANT

## 2018-10-05 NOTE — Progress Notes (Signed)
[facility] Podiatry                                                      Patient Demographics  [name], is a 62 y.o. female   MRN: [id number]   DOB - 1956-09-20  Admit Date - 09/28/2018    Outpatient Primary MD for the patient is Rutherford Guys, MD  Consult requested in the Hospital by Gladstone Lighter, MD, On 10/05/2018   With History of -  Past Medical History:  Diagnosis Date  . Acute myocardial infarction, subendocardial infarction, initial episode of care ([place]) 07/21/2012  . Acute osteomyelitis involving ankle and foot ([place]) 02/06/2015  . Acute systolic heart failure ([place]) 07/21/2012   New onset 07/19/12; admission to [facility] ED. Elevated Troponins.  S/p 2D-echo with EF 20-25%.  S/p cardiac catheterization with stenting LAD.  Repeat 2D-echo 10/2011 with improved EF of 35%.   . Anemia   . Automatic implantable cardioverter-defibrillator in situ    a. MDT CRT-D 06/2014, SN: [id number]    . CAD (coronary artery disease)    a. cardiac cath 101/04/2012: PCI/DES to chronically occluded mLAD, consideration PCI to diag branch in 4 weeks.   . Cataract   . Chicken pox   . Chronic systolic CHF (congestive heart failure) (HCC)    a. mixed ICM & NICM; b. EF 20-25% by echo 07/2012, mid-dist 2/3 of LV sev HK/AK, mild MR. echo 10/2012: EF 30-35%, sev HK ant-septal & inf walls, GR1DD, mild MR, PASP 33. c. echo 02/2013: EF 30%, GR1DD, mild MR. echo 04/2014: EF 30%, Septal-lat dyssynchrony, global HK, inf AK, GR1DD, mild MR. d. echo 10/2014: EF50-55%, WM nl, GR1DD, septal mild paradox. e. echo 02/2015: EF 50-55%, wm   . Depression   . Heart attack ([place])   . Heel ulcer ([place]) 04/27/2015  . History of blood transfusion ~ 2011   "plasma; had neuropathy; couldn't walk"  . Hypertension   . LBBB (left bundle branch block)   . Neuromuscular disorder ([place])   . Neuropathy  2011  . Obesity, unspecified   . OSA on CPAP    Moderate with AHI 23/hr and now on CPAP at 16cm H2O.  Her DME is [facility]  . Pure hypercholesterolemia   . Type II diabetes mellitus ([place])   . Unspecified vitamin D deficiency       Past Surgical History:  Procedure Laterality Date  . AMPUTATION TOE Right 06/18/2015   Procedure: AMPUTATION TOE;  Surgeon: Samara Deist, DPM;  Location: [facility] ORS;  Service: Podiatry;  Laterality: Right;  . BACK SURGERY    . BI-VENTRICULAR IMPLANTABLE CARDIOVERTER DEFIBRILLATOR N/A 07/06/2014   Procedure: BI-VENTRICULAR IMPLANTABLE CARDIOVERTER DEFIBRILLATOR  (CRT-D);  Surgeon: Deboraha Sprang, MD;  Location: [facility] CATH LAB;  Service: Cardiovascular;  Laterality: N/A;  . BI-VENTRICULAR IMPLANTABLE CARDIOVERTER DEFIBRILLATOR  (CRT-D)  07/06/2014  . BILATERAL OOPHORECTOMY  01/2011   ovarian cyst benign  . COLONOSCOPY WITH PROPOFOL Left 02/22/2015   Procedure: COLONOSCOPY WITH PROPOFOL;  Surgeon: Hulen Luster, MD;  Location: [facility] ENDOSCOPY;  Service: Endoscopy;  Laterality: Left;  . CORONARY ANGIOPLASTY WITH STENT PLACEMENT Left 07/2012   new onset systolic CHF; elevated troponins.  Cardiac catheterization with stenting to LAD; EF 15%.  2D-echo: EF 20-25%.  . ESOPHAGOGASTRODUODENOSCOPY N/A 02/22/2015   Procedure: ESOPHAGOGASTRODUODENOSCOPY (EGD);  Surgeon:  Hulen Luster, MD;  Location: [facility] ENDOSCOPY;  Service: Endoscopy;  Laterality: N/A;  . INCISION AND DRAINAGE ABSCESS Right 2007   groin; with ICU stay due to sepsis.  Marland Kitchen LAPAROSCOPIC CHOLECYSTECTOMY  2011  . LEFT HEART CATH AND CORONARY ANGIOGRAPHY N/A 10/05/2018   Procedure: LEFT HEART CATH AND CORONARY ANGIOGRAPHY;  Surgeon: Minna Merritts, MD;  Location: [facility] CV LAB;  Service: Cardiovascular;  Laterality: N/A;  . LEFT HEART CATHETERIZATION WITH CORONARY ANGIOGRAM N/A 07/21/2012   Procedure: LEFT HEART CATHETERIZATION WITH CORONARY ANGIOGRAM;  Surgeon: Jolaine Artist, MD;  Location: [facility] CATH LAB;  Service:  Cardiovascular;  Laterality: N/A;  . [place]   L4-5  . PERCUTANEOUS CORONARY STENT INTERVENTION (PCI-S) N/A 07/23/2012   Procedure: PERCUTANEOUS CORONARY STENT INTERVENTION (PCI-S);  Surgeon: Sherren Mocha, MD;  Location: [facility] CATH LAB;  Service: Cardiovascular;  Laterality: N/A;  . PERIPHERAL VASCULAR CATHETERIZATION N/A 02/10/2015   Procedure: Picc Line Insertion;  Surgeon: Katha Cabal, MD;  Location: [facility] CV LAB;  Service: Cardiovascular;  Laterality: N/A;  . TEE WITHOUT CARDIOVERSION N/A 10/02/2018   Procedure: TRANSESOPHAGEAL ECHOCARDIOGRAM (TEE);  Surgeon: Wellington Hampshire, MD;  Location: [facility] ORS;  Service: Cardiovascular;  Laterality: N/A;  . [place]  . VAGINAL HYSTERECTOMY  01/2011   Fibroids/DUB.  Ovaries removed. [place].    in for   Chief Complaint  Patient presents with  . Code Sepsis     HPI  [name]  is a 62 y.o. female, patient admitted with sepsis.  Has significant cardiovascular issues.  Seen by Dr. Rockey Situ today.  He decided not to pursue a heart catheterization at this time and feels like we should go ahead and proceed with a digital amputation sometime this week.    Review of Systems    In addition to the HPI above,  No Fever-chills, No Headache, No changes with Vision or hearing, No problems swallowing food or Liquids, No Chest pain, Cough or Shortness of Breath, No Abdominal pain, No Nausea or Vommitting, Bowel movements are regular, No Blood in stool or Urine, No dysuria, No new skin rashes or bruises, No new joints pains-aches,  No new weakness, tingling, numbness in any extremity, No recent weight gain or loss, No polyuria, polydypsia or polyphagia, No significant Mental Stressors.  A full 10 point Review of Systems was done, except as stated above, all other Review of Systems were negative.   Social History Social History   Tobacco Use  . Smoking status: Never Smoker  . Smokeless  tobacco: Never Used  Substance Use Topics  . Alcohol use: No    Alcohol/week: 0.0 standard drinks    Family History Family History  Problem Relation Age of Onset  . Diabetes Mother   . Hypertension Mother   . Arthritis Mother        knees, lumbar DDD, cervical DDD  . Cancer Father        prostate,skin,lymphoma.  . Cancer Brother 42       bladder cancer; non-smoker  . Diabetes Maternal Grandmother   . Heart disease Maternal Grandmother   . COPD Maternal Grandmother   . Diabetes Paternal Grandmother   . Obesity Brother   . Diabetes Son   . Hypertension Son   . Cancer Maternal Grandfather   . Diabetes Paternal Grandfather     Prior to Admission medications   Medication Sig Start Date End Date Taking? Authorizing Provider  AMBULATORY NON FORMULARY MEDICATION  1 Units by Other route once. Medication Name: foot brace 11/15/15  Yes Patel, Donika K, DO  Calcium Carb-Cholecalciferol (CALCIUM 600 + D PO) Take 1 tablet by mouth 2 (two) times daily.   Yes [provider]  citalopram (CELEXA) 20 MG tablet Take 1 tablet (20 mg total) by mouth daily. 04/15/18  Yes Wardell Honour, MD  clopidogrel (PLAVIX) 75 MG tablet Take 1 tablet (75 mg total) by mouth daily. Patient taking differently: Take 75 mg by mouth at bedtime.  04/15/18  Yes Wardell Honour, MD  ferrous sulfate 325 (65 FE) MG tablet Take 325 mg by mouth daily.   Yes [provider]  glimepiride (AMARYL) 4 MG tablet Take 1 tablet (4 mg total) by mouth 2 (two) times daily with a meal. 04/15/18  Yes Wardell Honour, MD  glucose blood (ONE TOUCH ULTRA TEST) test strip Check sugar three times daily dx: DMII uncontrolled insulin dependent with complications 6/81/15  Yes Wardell Honour, MD  Insulin Glargine (LANTUS) 100 UNIT/ML Solostar Pen Inject 30 Units into the skin at bedtime. 04/15/18  Yes Wardell Honour, MD  Insulin Pen Needle (B-D ULTRAFINE III SHORT PEN) 31G X 8 MM MISC USE AS DIRECTED WITH LANTUS; dx: DMII  uncontrolled, insulin dependent, with complications 05/02/19  Yes Wardell Honour, MD  losartan (COZAAR) 25 MG tablet Take 25 mg by mouth at bedtime.   Yes [provider]  omega-3 acid ethyl esters (LOVAZA) 1 g capsule Take 1 g by mouth 2 (two) times daily.   Yes [provider]  torsemide (DEMADEX) 20 MG tablet Take 1 tablet (20 mg total) by mouth daily. Patient taking differently: Take 20 mg by mouth at bedtime.  04/15/18  Yes Wardell Honour, MD  traMADol (ULTRAM) 50 MG tablet Take 1 tablet (50 mg total) by mouth every 6 (six) hours as needed. for pain Patient taking differently: Take 50 mg by mouth every 6 (six) hours as needed for moderate pain.  04/15/18  Yes Wardell Honour, MD  Dulaglutide (TRULICITY) 3.55 HR/4.1UL SOPN Inject 0.75 mg into the skin once a week. Patient not taking: Reported on 09/28/2018 04/15/18   Wardell Honour, MD  gabapentin (NEURONTIN) 600 MG tablet Take 1 tablet (600 mg total) by mouth 3 (three) times daily. Patient not taking: Reported on 09/28/2018 04/15/18   Wardell Honour, MD  rosuvastatin (CRESTOR) 40 MG tablet TAKE 1 TABLET BY MOUTH ONCE DAILY Patient not taking: Reported on 09/28/2018 12/16/17   Minna Merritts, MD    Anti-infectives (From admission, onward)   Start     Dose/Rate Route Frequency Ordered Stop   10/01/18 0900  ceFAZolin (ANCEF) IVPB 2g/100 mL premix     2 g 200 mL/hr over 30 Minutes Intravenous Every 8 hours 10/01/18 0813     09/30/18 2200  ceFAZolin (ANCEF) IVPB 2g/100 mL premix  Status:  Discontinued     2 g 200 mL/hr over 30 Minutes Intravenous Every 12 hours 09/30/18 1036 10/01/18 0813   09/29/18 2200  ceFAZolin (ANCEF) IVPB 1 g/50 mL premix  Status:  Discontinued     1 g 100 mL/hr over 30 Minutes Intravenous Every 12 hours 09/29/18 1505 09/30/18 1036   09/29/18 1000  ceFAZolin (ANCEF) IVPB 2g/100 mL premix  Status:  Discontinued     2 g 200 mL/hr over 30 Minutes Intravenous Every 8 hours 09/29/18 0921 09/29/18 1505    09/29/18 0000  ceFEPIme (MAXIPIME) 2 g in sodium  chloride 0.9 % 100 mL IVPB  Status:  Discontinued     2 g 200 mL/hr over 30 Minutes Intravenous Every 12 hours 09/28/18 1318 09/29/18 0919   09/28/18 1830  vancomycin (VANCOCIN) 1,250 mg in sodium chloride 0.9 % 250 mL IVPB  Status:  Discontinued     1,250 mg 166.7 mL/hr over 90 Minutes Intravenous Every 18 hours 09/28/18 1318 09/29/18 1235   09/28/18 1200  ceFEPIme (MAXIPIME) 2 g in sodium chloride 0.9 % 100 mL IVPB     2 g 200 mL/hr over 30 Minutes Intravenous  Once 09/28/18 1156 09/28/18 1301   09/28/18 1200  metroNIDAZOLE (FLAGYL) IVPB 500 mg  Status:  Discontinued     500 mg 100 mL/hr over 60 Minutes Intravenous Every 8 hours 09/28/18 1156 09/29/18 0919   09/28/18 1200  vancomycin (VANCOCIN) IVPB 1000 mg/200 mL premix     1,000 mg 200 mL/hr over 60 Minutes Intravenous  Once 09/28/18 1156 09/28/18 1339      Scheduled Meds: . aspirin EC  81 mg Oral Daily  . chlorhexidine  15 mL Mouth Rinse BID  . insulin aspart  0-20 Units Subcutaneous TID WC  . insulin aspart  0-5 Units Subcutaneous QHS  . insulin aspart  3 Units Subcutaneous TID WC  . insulin glargine  20 Units Subcutaneous Daily  . lidocaine  1 patch Transdermal Q24H  . losartan  25 mg Oral Daily  . mouth rinse  15 mL Mouth Rinse q12n4p  . methocarbamol  500 mg Oral TID  . metoprolol tartrate  25 mg Oral Q6H  . rosuvastatin  40 mg Oral q1800  . sodium chloride flush  3 mL Intravenous Q12H   Continuous Infusions: . sodium chloride Stopped (10/02/18 0322)  . amiodarone 30 mg/hr (10/05/18 1527)  .  ceFAZolin (ANCEF) IV 2 g (10/05/18 1714)   PRN Meds:.sodium chloride, acetaminophen **OR** acetaminophen, [DISCONTINUED] ondansetron **OR** ondansetron (ZOFRAN) IV, promethazine, sodium chloride flush, traMADol  No Known Allergies  Physical Exam  Vitals  Blood pressure (!) 131/49, pulse 64, temperature 98.2 F (36.8 C), temperature source Oral, resp. rate 18, height 5\' 7"   (1.702 m), weight 127.9 kg, SpO2 99 %.  Lower Extremity exam:  Vascular: Difficult to palpate DP and PT pulses to the left foot.  She is never had any kind of vascular intervention and I am concerned that she may have some peripheral arterial disease.  I will order a vascular consult for that evaluation.  May bring in a hand-held Doppler just to listen to the arterial inflow as well.  Dermatological: Patient has a wound on the dorsum of the second toe of the left foot at the PIP joint.  The wound appears to penetrate all the way down to the bone level.  Appears to be erosive changes of the extensor tendon to this region.  Some mild erythema around the toe itself and just at the very base have been no extension proximally at this point.  Neurological: Significant peripheral neuropathy secondary to diabetes  Ortho: Contracted second toe with some evidence of erosive changes on x-ray.  The extensor tendon is likely eroded and the toe is in a flexion contracture that she cannot straighten out.  There is bone exposure in the wound.  Data Review  CBC Recent Labs  Lab 10/01/18 0647 10/02/18 0540 10/03/18 0418 10/04/18 0434 10/05/18 0350  WBC 7.4 7.6 10.2 8.2 7.3  HGB 11.0* 10.7* 10.7* 10.4* 10.9*  HCT 33.0* 32.8* 32.8* 31.6* 33.1*  PLT 61* 63* 80* 123* 165  MCV 89.2 89.9 90.9 89.3 90.7  MCH 29.7 29.3 29.6 29.4 29.9  MCHC 33.3 32.6 32.6 32.9 32.9  RDW 13.2 13.2 13.1 13.0 12.9   ------------------------------------------------------------------------------------------------------------------  Chemistries  Recent Labs  Lab 09/29/18 0217  09/30/18 0540 10/01/18 0647 10/02/18 0540 10/02/18 1513 10/03/18 0418 10/04/18 0434 10/05/18 0350  NA 136   < > 137 136 136  --  132* 135 134*  K 3.5   < > 3.4* 3.6 3.4*  --  3.6 3.6 4.1  CL 105   < > 105 105 104  --  101 103 101  CO2 23   < > 22 24 25   --  22 23 24   GLUCOSE 325*   < > 167* 162* 157*  --  208* 190* 168*  BUN 58*   < > 59* 47*  39*  --  31* 29* 27*  CREATININE 2.40*   < > 1.75* 1.25* 1.24*  --  1.17* 1.20* 1.34*  CALCIUM 7.9*   < > 8.0* 8.0* 8.0*  --  7.5* 7.7* 7.9*  MG 1.6*  --   --   --   --  1.8  --   --   --   AST 103*  --  100* 41  --   --   --   --   --   ALT 38  --  45* 26  --   --   --   --   --   ALKPHOS 40  --  42 41  --   --   --   --   --   BILITOT 0.8  --  0.6 0.6  --   --   --   --   --    < > = values in this interval not displayed.   -------------------------------------------------------------------------------------------  Assessment & Plan: The patient will be better served by amputation to the second toe.  She had a similar experience with her right foot Dr. Vickki Muff take it off couple years ago 1 of the toes.  She does have significant problems from cardiac standpoint she been followed closely by Dr. Rockey Situ.  He felt like a catheterization would likely be dangerous for him and recommends that we consider proceeding with surgery sometime in the next few days.  Should be able to do the surgery just under very very light sedation with a local anesthetic and I can get the toe amputated MTP joint level give her better chance to heal but I do want to get a vascular evaluation for since I do not easily palpate her pulses.  Active Problems:   Sepsis [facility])   Family Communication: Plan discussed with patient   Albertine Patricia M.D on 10/05/2018 at 5:16 PM  Thank you for the consult, we will follow the patient with you in the Hospital.

## 2018-10-05 NOTE — Progress Notes (Signed)
Progress Note  Patient Name: [name] Date of Encounter: 10/05/2018  Primary Cardiologist: Rockey Situ Primary Electrophysiologist: Caryl Comes  Subjective   Converted to regular tachycardia rate 130 bpm early this morning Rhythm unclear, appears regular, wide-complex consistent with left bundle branch block P waves noted intermittently Prior to that heart rate has been in the 70s normal sinus rhythm In the catheterization lab was given metoprolol 5 IV x2 with no improvement in her heart rate. Adenosine 6 mg given and rhythm broke.  Unable to determine underlying atrial arrhythmia at the time.  After 30 seconds or so of normal sinus rhythm went back into rapid rate 130 bpm.  She was relatively asymptomatic Rhythm appears consistent with prior episodes this hospitalization first noticed on arrival, paroxysmal December 2017 through the weekend  Inpatient Medications    Scheduled Meds: . aspirin EC  81 mg Oral Daily  . chlorhexidine  15 mL Mouth Rinse BID  . insulin aspart  0-20 Units Subcutaneous TID WC  . insulin aspart  0-5 Units Subcutaneous QHS  . insulin aspart  3 Units Subcutaneous TID WC  . insulin glargine  20 Units Subcutaneous Daily  . lidocaine  1 patch Transdermal Q24H  . mouth rinse  15 mL Mouth Rinse q12n4p  . methocarbamol  500 mg Oral TID  . metoprolol tartrate  25 mg Oral Q6H  . rosuvastatin  40 mg Oral q1800  . sodium chloride flush  3 mL Intravenous Q12H   Continuous Infusions: . sodium chloride Stopped (10/02/18 0322)  . amiodarone 60 mg/hr (10/05/18 1209)   Followed by  . amiodarone    .  ceFAZolin (ANCEF) IV Stopped (10/05/18 0215)   PRN Meds: sodium chloride, acetaminophen **OR** acetaminophen, [DISCONTINUED] ondansetron **OR** ondansetron (ZOFRAN) IV, promethazine, sodium chloride flush, traMADol   Vital Signs    Vitals:   10/05/18 1215 10/05/18 1230 10/05/18 1245 10/05/18 1310  BP: (!) 141/55 (!) 149/60 (!) 150/56 (!) 147/57  Pulse: (!)  59 60 60 (!) 59  Resp: 19 16 20 19   Temp:    98.5 F (36.9 C)  TempSrc:    Oral  SpO2: 93% 94% 95% 98%  Weight:      Height:        Intake/Output Summary (Last 24 hours) at 10/05/2018 1356 Last data filed at 10/05/2018 0900 Gross per 24 hour  Intake 290.83 ml  Output 200 ml  Net 90.83 ml   Filed Weights   09/29/18 0500 10/04/18 0403 10/05/18 0300  Weight: 124.9 kg 128.4 kg 127.9 kg    Telemetry    Paced rhythm, 70s- Personally Reviewed Converting to wide-complex tachycardia heart rate 130 concerning for either atrial tachycardia or atrial fibrillation/flutter  ECG    n/a - Personally Reviewed  Physical Exam   Constitutional:  oriented to person, place, and time. No distress.  Obese HENT:  Head: Grossly normal Eyes:  no discharge. No scleral icterus.  Neck: No JVD, no carotid bruits  Cardiovascular: Regular , tachycardic,  no murmurs appreciated Pulmonary/Chest: Clear to auscultation bilaterally, no wheezes or rails Abdominal: Soft.  no distension.  no tenderness.  Musculoskeletal: Normal range of motion Neurological:  normal muscle tone. Coordination normal. No atrophy Skin: Skin warm and dry Psychiatric: normal affect, pleasant\    Labs    Chemistry Recent Labs  Lab 09/29/18 0217  09/30/18 0540 10/01/18 0647  10/03/18 0418 10/04/18 0434 10/05/18 0350  NA 136   < > 137 136   < >  132* 135 134*  K 3.5   < > 3.4* 3.6   < > 3.6 3.6 4.1  CL 105   < > 105 105   < > 101 103 101  CO2 23   < > 22 24   < > 22 23 24   GLUCOSE 325*   < > 167* 162*   < > 208* 190* 168*  BUN 58*   < > 59* 47*   < > 31* 29* 27*  CREATININE 2.40*   < > 1.75* 1.25*   < > 1.17* 1.20* 1.34*  CALCIUM 7.9*   < > 8.0* 8.0*   < > 7.5* 7.7* 7.9*  PROT 6.0*  --  5.6* 5.5*  --   --   --   --   ALBUMIN 2.9*  --  2.6* 2.6*  --   --   --   --   AST 103*  --  100* 41  --   --   --   --   ALT 38  --  45* 26  --   --   --   --   ALKPHOS 40  --  42 41  --   --   --   --   BILITOT 0.8  --  0.6  0.6  --   --   --   --   GFRNONAA 21*   < > 30* 45*   < > 49* 48* 42*  GFRAA 24*   < > 35* 53*   < > 57* 55* 48*  ANIONGAP 8   < > 10 7   < > 9 9 9    < > = values in this interval not displayed.     Hematology Recent Labs  Lab 10/03/18 0418 10/04/18 0434 10/05/18 0350  WBC 10.2 8.2 7.3  RBC 3.61* 3.54* 3.65*  HGB 10.7* 10.4* 10.9*  HCT 32.8* 31.6* 33.1*  MCV 90.9 89.3 90.7  MCH 29.6 29.4 29.9  MCHC 32.6 32.9 32.9  RDW 13.1 13.0 12.9  PLT 80* 123* 165    Cardiac Enzymes Recent Labs  Lab 09/28/18 1725 09/28/18 1902 09/29/18 0217  TROPONINI 2.89* 5.20* 24.26*   No results for input(s): TROPIPOC in the last 168 hours.   BNP Recent Labs  Lab 09/29/18 0217  BNP 1,758.0*     DDimer No results for input(s): DDIMER in the last 168 hours.   Radiology    No results found.  Cardiac Studies   Echo 09/29/2018: Study Conclusions  - Left ventricle: The cavity size was normal. Systolic function was mildly reduced. The estimated ejection fraction was in the range of 45% to 50%. The study is not technically sufficient to allow evaluation of LV diastolic function. - Left atrium: The atrium was mildly dilated.  Impressions:  - Very suboptimal study. No evidence of vegetations. Consider TEE.  Patient Profile     62 y.o. female with history of multivessel CAD with CTO of the LAD which was treated with PCI at that time but the distal vessel was small in size and there was diffuse dissection that was treated with PTCA. She also has history of chronic systolic CHF s/p ICD CRT in 2015 with subsequent normalization of EF, osteomyelitis, DM2, HTN, obesity and OSA on CPAP who we are seeing for NSTEMI and MSSA bacteremia.   Assessment & Plan    1. NSTEMI: -Currently, chest pain free -Troponin 24.26 Elevated troponin likely exacerbated by paroxysmal tachycardia rate 130 on arrival,  in the setting of febrile state, hypertension known coronary artery  disease --Catheterization delayed secondary to arrhythmia  2) paroxysmal tachycardia Etiology unclear, appears regular, unable to exclude atrial tachycardia versus fib flutter Metoprolol IV push x2 did not seem to improve her symptoms, rate did not change Adenosine converted her to normal sinus rhythm briefly 30 seconds or so but converted back to rapid rate in the 130 range --We have ordered amiodarone bolus with infusion -Shortly after the bolus she converted to normal sinus rhythm, Would recommend we continue infusion at this time over the next several days through the pending surgery on her toe At a later date would change to oral dosing  2. MSSA bacteremia: No clear vegetation noted on the wire per the report, Though typically hard to definitively exclude endocarditis given regions of the wire that cannot be typically visualized  3. Acute on chronic systolic CHF: -Status post ICD CRT device -Not currently on ACEi/ARB/spironolactone given acute on CKD Will not start on aggressive diuresis given she has been n.p.o. today, will likely need to be n.p.o. in the next day or 2 for surgery on toe  4. Thrombocytopenia: avoid all heparin products Slow improvement, possibly secondary to sepsis  5. Acute on CKD stage III: -Suspected ATN from sepsis -Renal function stable -Avoid nephrotoxins   6. HLD: -Crestor, lipids at goal  Time spent discussing paroxysmal arrhythmia with nursing and cardiac catheterization staff, orders for metoprolol placed twice, adenosine given, then amiodarone orders placed Normal sinus rhythm eventually restored.  Details of these medications discussed with the patient  Total encounter time more than 45 minutes  Greater than 50% was spent in counseling and coordination of care with the patient    For questions or updates, please contact [place] Please consult [email or web address] for contact info under Cardiology/STEMI.    Signed, Christell Faith,  PA-C [place] Pager: [phone] 10/05/2018, 1:56 PM

## 2018-10-05 NOTE — Care Management Note (Signed)
Case Management Note  Patient Details  Name: KALEE BROXTON MRN: [id number] Date of Birth: 07-19-1956  Subjective/Objective:       Patient is from home; lives with several family members.  She is status post cardiac catheterization today.  Plan to amputate left second toe on 12-31 per patient.  She is current with her PCP, Medications obtained at [facility] drug.  Denies difficulties obtaining medications or with medical care.  She is curretly on 2L acute O2.  Has wheelchair, walker, shower chair, BSC and rolling walker at home.  States she does not want or need home health services at this time.  Will continue to follow through discharge.               Action/Plan:   Expected Discharge Date:                  Expected Discharge Plan:  Home/Self Care  In-House Referral:     Discharge planning Services  CM Consult  Post Acute Care Choice:    Choice offered to:     DME Arranged:    DME Agency:     HH Arranged:    HH Agency:     Status of Service:  In process, will continue to follow  If discussed at Long Length of Stay Meetings, dates discussed:    Additional Comments:  Elza Rafter, RN 10/05/2018, 4:26 PM

## 2018-10-05 NOTE — Progress Notes (Signed)
[facility] [place] NAME: [name]    MR#:  [id number]  DATE OF BIRTH:  02/22/1956  SUBJECTIVE:  CHIEF COMPLAINT:   Chief Complaint  Patient presents with  . Code Sepsis   -Tachycardic this morning heart rate into the 130s, improved with amiodarone drip -Status post cardiac catheterization showing chronically occluded LAD and patent previous stent  REVIEW OF SYSTEMS:  Review of Systems  Constitutional: Negative for chills, fever and malaise/fatigue.  HENT: Negative for congestion, ear discharge, hearing loss and nosebleeds.   Eyes: Negative for blurred vision and double vision.  Respiratory: Negative for cough and shortness of breath.   Cardiovascular: Negative for chest pain, palpitations and leg swelling.  Gastrointestinal: Negative for abdominal pain, heartburn, nausea and vomiting.  Genitourinary: Negative for dysuria and urgency.  Musculoskeletal: Positive for back pain and myalgias.  Neurological: Negative for dizziness, focal weakness, weakness and headaches.  Psychiatric/Behavioral: Negative for depression.    DRUG ALLERGIES:  No Known Allergies  VITALS:  Blood pressure (!) 141/55, pulse (!) 59, temperature 98.5 F (36.9 C), temperature source Oral, resp. rate 19, height 5\' 7"  (1.702 m), weight 127.9 kg, SpO2 93 %.  PHYSICAL EXAMINATION:  Physical Exam   GENERAL:  62 y.o.-year-old obese patient lying in the bed with no acute distress.  EYES: Pupils equal, round, reactive to light and accommodation. No scleral icterus. Extraocular muscles intact.  HEENT: Head atraumatic, normocephalic. Oropharynx and nasopharynx clear.  NECK:  Supple, no jugular venous distention. No thyroid enlargement, no tenderness.  LUNGS: Normal breath sounds bilaterally, no wheezing, rales,rhonchi or crepitation. No use of accessory muscles of respiration. Decreased bibasilar breath sounds CARDIOVASCULAR: S1, S2 normal. No  rubs, or gallops.  2/6 systolic murmur present ICD on left chest ABDOMEN: Soft, nontender, nondistended. Bowel sounds present. No organomegaly or mass.  EXTREMITIES: No pedal edema, cyanosis, or clubbing.  Left foot second toe swelling, decreased sensation but improved color -Right groin cardiac cath site with no active bleeding NEUROLOGIC: Cranial nerves II through XII are intact. Muscle strength 5/5 in all extremities. Sensation intact. Gait not checked. Global weakness noted. PSYCHIATRIC: The patient is alert and oriented x 3.  SKIN: No obvious rash, lesion, or ulcer.    LABORATORY PANEL:   CBC Recent Labs  Lab 10/05/18 0350  WBC 7.3  HGB 10.9*  HCT 33.1*  PLT 165   ------------------------------------------------------------------------------------------------------------------  Chemistries  Recent Labs  Lab 10/01/18 0647  10/02/18 1513  10/05/18 0350  NA 136   < >  --    < > 134*  K 3.6   < >  --    < > 4.1  CL 105   < >  --    < > 101  CO2 24   < >  --    < > 24  GLUCOSE 162*   < >  --    < > 168*  BUN 47*   < >  --    < > 27*  CREATININE 1.25*   < >  --    < > 1.34*  CALCIUM 8.0*   < >  --    < > 7.9*  MG  --   --  1.8  --   --   AST 41  --   --   --   --   ALT 26  --   --   --   --   ALKPHOS 41  --   --   --   --  BILITOT 0.6  --   --   --   --    < > = values in this interval not displayed.   ------------------------------------------------------------------------------------------------------------------  Cardiac Enzymes Recent Labs  Lab 09/29/18 0217  TROPONINI 24.26*   ------------------------------------------------------------------------------------------------------------------  RADIOLOGY:  No results found.  EKG:   Orders placed or performed during the hospital encounter of 09/28/18  . ED EKG 12-Lead  . ED EKG 12-Lead  . EKG 12-Lead  . EKG 12-Lead  . EKG 12-Lead  . EKG 12-Lead  . EKG 12-Lead  . EKG 12-Lead  . EKG 12-Lead  . EKG 12-Lead  . EKG  12-Lead  . EKG 12-Lead  . EKG 12-Lead  . EKG 12-Lead  . EKG 12-Lead  . EKG 12-Lead  . EKG 12-Lead  . EKG 12-Lead  . EKG 12-Lead  . EKG 12-Lead  . EKG 12-Lead  . EKG 12-Lead  . EKG 12-Lead  . EKG 12-Lead    ASSESSMENT AND PLAN:   62 year old female with past medical history significant for CAD status post PCI, chronic systolic CHF status post ICD, ischemic cardiomyopathy, diabetes, hypertension, obesity, sleep apnea on CPAP admitted for MSSA bacteremia and left foot second toe ulceration.  1.  Staph aureus bacteremia-blood culture showing MSSA, currently on IV Ancef -Has left foot second toe infection. -Repeat blood cultures from 09/29/2018 are negative so far.  Appreciate ID consult. -2D echo without any vegetations.  TEE done on 10/02/2018 showing no vegetations, and the ICD wires do not look infected.. -Afebrile at this time, WBC is within normal limits. -We will need her left foot second toe amputation done and the bone cultures to see if she had osteomyelitis or not.  That will determine her duration of antibiotics.  2.  NSTEMI-Heparin drip was discontinued as it was causing significant thrombocytopenia. -No active complaints at this time.  Status post cardiac catheterization this morning revealing chronically occluded LAD.  Cardiologist reviewing films to see if she patient would need any intervention or medical management at this time.. - known CAD history.  On aspirin, statin, Coreg and losartan -Appreciate cardiology consult.    3.  Left foot second toe infection-cultures are growing MSSA -Appreciate podiatry consult.   -If no further cardiac intervention planned, cardiologist to clear for left foot toe amputation later this week.  4.  Acute renal failure on CKD-avoid nephrotoxins.  Creatinine is much improved and stable   5.  Chronic systolic CHF-euvolemic at this time.  Diuretics on hold given renal failure -Continue cardiac medications including losartan, aspirin,  Coreg and statin.  Entresto as outpatient if blood pressure permits  6.  Diabetes mellitus-on Lantus and sliding scale insulin.  7.  Tachycardia-patient is status post pacemaker placement -Started on amiodarone this morning.  Converted to sinus rhythm.  Continue management per cardiology  8.  DVT prophylaxis-teds and SCDs  Physical therapy consulted  Husband at bedside   All the records are reviewed and case discussed with Care Management/Social Workerr. Management plans discussed with the patient, family and they are in agreement.  CODE STATUS: Full Code  TOTAL TIME TAKING CARE OF THIS PATIENT: 31 minutes.   POSSIBLE D/C IN 5-6 DAYS, DEPENDING ON CLINICAL CONDITION.   Gladstone Lighter M.D on 10/05/2018 at 12:24 PM  Between 7am to 6pm - Pager - [phone]  After 6pm go to [email or web address] - password [email or web address] [facility]  Office  [phone]  CC: Primary care physician; Rutherford Guys, MD

## 2018-10-05 NOTE — Progress Notes (Signed)
Cardiac catheterization   showing severe disease of the LAD and diagonal branches Both vessels are relatively small in size, diffuse stenoses throughout Vessel has tortuous regions, Mid to distal LAD not amenable to stenting given small size Review of prior history of catheterization October 2013 LAD was occluded Dr. Burt Knack was able to open the vessel with complications/dissection  Case discussed with interventional cardiology It is felt the benefit that she would get out of high risk stenting of the proximal LAD is small RCA and circumflex are helping with collaterals to the LAD territory Currently not having unstable angina symptoms She presented with predominantly demand ischemia in the setting of febrile illness, bacteremia, hypertensive, tachycardic going 130 bpm  Safest thing for [name] would be to get her through her toe amputation, With the hope of using nerve block, moderate sedation She did handle moderate sedation well in the catheterization lab  There was arrhythmia noted prior to the procedure which was terminated by starting amiodarone infusion Plan will be to continue amiodarone infusion until perhaps the day following the procedure and once tolerating a diet would change to oral amiodarone  The above was discussed with the patient, she is in agreement She denies having any recent shortness of breath or chest pain symptoms leading to her hospitalization concerning for unstable angina  Given chronic total occlusion of the LAD dating back 6 years or more, There is concern of lack of viability of the anterior wall anyway making a high risk stenting procedure of little to no benefit  Signed, Vickie Plants, MD, Ph.D [facility]

## 2018-10-06 ENCOUNTER — Encounter
Admission: EM | Disposition: A | Discharge status: Home Health | Payer: Self-pay | Source: Home / Self Care | Attending: Internal Medicine

## 2018-10-06 DIAGNOSIS — L97529 Non-pressure chronic ulcer of other part of left foot with unspecified severity: Secondary | ICD-10-CM

## 2018-10-06 DIAGNOSIS — I5033 Acute on chronic diastolic (congestive) heart failure: Secondary | ICD-10-CM

## 2018-10-06 DIAGNOSIS — E7800 Pure hypercholesterolemia, unspecified: Secondary | ICD-10-CM

## 2018-10-06 DIAGNOSIS — I248 Other forms of acute ischemic heart disease: Secondary | ICD-10-CM

## 2018-10-06 DIAGNOSIS — I2510 Atherosclerotic heart disease of native coronary artery without angina pectoris: Secondary | ICD-10-CM

## 2018-10-06 DIAGNOSIS — E1151 Type 2 diabetes mellitus with diabetic peripheral angiopathy without gangrene: Secondary | ICD-10-CM

## 2018-10-06 DIAGNOSIS — I70201 Unspecified atherosclerosis of native arteries of extremities, right leg: Secondary | ICD-10-CM

## 2018-10-06 DIAGNOSIS — I70245 Atherosclerosis of native arteries of left leg with ulceration of other part of foot: Secondary | ICD-10-CM

## 2018-10-06 HISTORY — PX: PERIPHERAL VASCULAR BALLOON ANGIOPLASTY: CATH118281

## 2018-10-06 LAB — GLUCOSE, CAPILLARY
Glucose-Capillary: 186 mg/dL — ABNORMAL HIGH (ref 70–99)
Glucose-Capillary: 182 mg/dL — ABNORMAL HIGH (ref 70–99)
Glucose-Capillary: 139 mg/dL — ABNORMAL HIGH (ref 70–99)
Glucose-Capillary: 266 mg/dL — ABNORMAL HIGH (ref 70–99)

## 2018-10-06 LAB — BASIC METABOLIC PANEL
Anion gap: 9 (ref 5–15)
BUN: 25 mg/dL — ABNORMAL HIGH (ref 8–23)
CHLORIDE: 101 mmol/L (ref 98–111)
CO2: 23 mmol/L (ref 22–32)
Calcium: 7.7 mg/dL — ABNORMAL LOW (ref 8.9–10.3)
Creatinine, Ser: 1.35 mg/dL — ABNORMAL HIGH (ref 0.44–1.00)
GFR calc Af Amer: 48 mL/min — ABNORMAL LOW (ref >60)
GFR calc non Af Amer: 41 mL/min — ABNORMAL LOW (ref >60)
Glucose, Bld: 197 mg/dL — ABNORMAL HIGH (ref 70–99)
Potassium: 3.6 mmol/L (ref 3.5–5.1)
SODIUM: 133 mmol/L — AB (ref 135–145)

## 2018-10-06 SURGERY — PERIPHERAL VASCULAR BALLOON ANGIOPLASTY
Anesthesia: Moderate Sedation | Laterality: Left

## 2018-10-06 MED ORDER — SODIUM CHLORIDE 0.9% FLUSH
3.0000 mL | Freq: Two times a day (BID) | INTRAVENOUS | Status: DC
  Administered 2018-10-07 – 2018-10-08 (×2): 3 mL via INTRAVENOUS

## 2018-10-06 MED ORDER — CEFAZOLIN SODIUM-DEXTROSE 2-4 GM/100ML-% IV SOLN
INTRAVENOUS | Status: AC
  Filled 2018-10-06: qty 100

## 2018-10-06 MED ORDER — MIDAZOLAM HCL 5 MG/5ML IJ SOLN
INTRAMUSCULAR | Status: AC
  Filled 2018-10-06: qty 5

## 2018-10-06 MED ORDER — CLOPIDOGREL BISULFATE 75 MG PO TABS
75.0000 mg | ORAL_TABLET | Freq: Every day | ORAL | Status: DC
  Administered 2018-10-07: 75 mg via ORAL
  Filled 2018-10-06: qty 1

## 2018-10-06 MED ORDER — HEPARIN SODIUM (PORCINE) 1000 UNIT/ML IJ SOLN
INTRAMUSCULAR | Status: DC | PRN
  Administered 2018-10-06: 4000 [IU] via INTRAVENOUS

## 2018-10-06 MED ORDER — MORPHINE SULFATE (PF) 2 MG/ML IV SOLN: 2.0000 mg | INTRAVENOUS | Status: DC | PRN

## 2018-10-06 MED ORDER — FENTANYL CITRATE (PF) 100 MCG/2ML IJ SOLN
INTRAMUSCULAR | Status: AC
  Filled 2018-10-06: qty 2

## 2018-10-06 MED ORDER — SODIUM CHLORIDE 0.9 % IV SOLN
250.0000 mL | INTRAVENOUS | Status: DC | PRN
  Administered 2018-10-07: 250 mL via INTRAVENOUS
  Administered 2018-10-08: 08:00:00 via INTRAVENOUS

## 2018-10-06 MED ORDER — SODIUM CHLORIDE 0.9% FLUSH: 3.0000 mL | INTRAVENOUS | Status: DC | PRN

## 2018-10-06 MED ORDER — HEPARIN (PORCINE) IN NACL 1000-0.9 UT/500ML-% IV SOLN
INTRAVENOUS | Status: AC
  Filled 2018-10-06: qty 2000

## 2018-10-06 MED ORDER — MIDAZOLAM HCL 2 MG/2ML IJ SOLN
INTRAMUSCULAR | Status: DC | PRN
  Administered 2018-10-06: 1 mg via INTRAVENOUS
  Administered 2018-10-06: 0.5 mg via INTRAVENOUS
  Administered 2018-10-06: 2 mg via INTRAVENOUS
  Administered 2018-10-06: 1 mg via INTRAVENOUS

## 2018-10-06 MED ORDER — HEPARIN SODIUM (PORCINE) 1000 UNIT/ML IJ SOLN
INTRAMUSCULAR | Status: AC
  Filled 2018-10-06: qty 1

## 2018-10-06 MED ORDER — FENTANYL CITRATE (PF) 100 MCG/2ML IJ SOLN
INTRAMUSCULAR | Status: DC | PRN
  Administered 2018-10-06 (×3): 25 ug via INTRAVENOUS
  Administered 2018-10-06: 50 ug via INTRAVENOUS

## 2018-10-06 MED ORDER — SODIUM CHLORIDE 0.9 % IV SOLN
INTRAVENOUS | Status: DC
  Administered 2018-10-06 – 2018-10-07 (×2): via INTRAVENOUS

## 2018-10-06 MED ORDER — OXYCODONE HCL 5 MG PO TABS
5.0000 mg | ORAL_TABLET | ORAL | Status: DC | PRN
  Administered 2018-10-08 – 2018-10-09 (×6): 10 mg via ORAL
  Filled 2018-10-06 (×6): qty 2

## 2018-10-06 MED ORDER — CLOPIDOGREL BISULFATE 75 MG PO TABS
300.0000 mg | ORAL_TABLET | ORAL | Status: AC
  Administered 2018-10-06: 300 mg via ORAL
  Filled 2018-10-06: qty 4

## 2018-10-06 MED ORDER — SODIUM CHLORIDE 0.9 % IV SOLN: INTRAVENOUS | Status: AC

## 2018-10-06 MED ORDER — HYDROCODONE-ACETAMINOPHEN 5-325 MG PO TABS
1.0000 | ORAL_TABLET | Freq: Four times a day (QID) | ORAL | Status: DC | PRN
  Administered 2018-10-06 – 2018-10-08 (×6): 2 via ORAL
  Filled 2018-10-06 (×6): qty 2

## 2018-10-06 SURGICAL SUPPLY — 31 items
BALLN LUTONIX 4X120X130 (BALLOONS) ×3
BALLN LUTONIX 5X150X130 (BALLOONS) ×6
BALLN LUTONIX DCB 4X60X130 (BALLOONS) ×3
BALLN LUTONIX DCB 6X40X130 (BALLOONS) ×3
BALLN ULTRASCORE 4X100X130 (BALLOONS) ×3
BALLN ULTRVRSE 3X40X130C (BALLOONS) ×3
BALLOON LUTONIX 4X120X130 (BALLOONS) IMPLANT
BALLOON LUTONIX 5X150X130 (BALLOONS) IMPLANT
BALLOON LUTONIX DCB 4X60X130 (BALLOONS) IMPLANT
BALLOON LUTONIX DCB 6X40X130 (BALLOONS) IMPLANT
BALLOON ULTRASCORE 4X100X130 (BALLOONS) IMPLANT
BALLOON ULTRVRSE 3X40X130C (BALLOONS) IMPLANT
CATH PIG 70CM (CATHETERS) ×2 IMPLANT
CATH VERT 5FR 125CM (CATHETERS) ×2 IMPLANT
DEVICE PRESTO INFLATION (MISCELLANEOUS) ×4 IMPLANT
DEVICE STARCLOSE SE CLOSURE (Vascular Products) ×2 IMPLANT
DEVICE TORQUE .025-.038 (MISCELLANEOUS) ×2 IMPLANT
NDL ENTRY 21GA 7CM ECHOTIP (NEEDLE) IMPLANT
NEEDLE ENTRY 21GA 7CM ECHOTIP (NEEDLE) ×3 IMPLANT
PACK ANGIOGRAPHY (CUSTOM PROCEDURE TRAY) ×2 IMPLANT
SET INTRO CAPELLA COAXIAL (SET/KITS/TRAYS/PACK) ×2 IMPLANT
SHEATH ANL2 6FRX45 HC (SHEATH) ×2 IMPLANT
SHEATH BRITE TIP 5FRX11 (SHEATH) ×2 IMPLANT
SHIELD RADPAD SCOOP 12X17 (MISCELLANEOUS) ×2 IMPLANT
STENT LIFESTENT 5F 5X60X135 (Permanent Stent) ×2 IMPLANT
STENT LIFESTENT 5F 6X150X135 (Permanent Stent) ×2 IMPLANT
STENT LIFESTENT 5F 7X40X135 (Permanent Stent) ×2 IMPLANT
TUBING CONTRAST HIGH PRESS 72 (TUBING) ×2 IMPLANT
WIRE AQUATRACK .035X260CM (WIRE) ×2 IMPLANT
WIRE J 3MM .035X145CM (WIRE) ×2 IMPLANT
WIRE MAGIC TORQUE 315CM (WIRE) ×2 IMPLANT

## 2018-10-06 NOTE — Op Note (Signed)
[name] Percutaneous Study/Intervention Procedural Note   Date of Surgery: 10/06/2018  Surgeon:  Katha Cabal, MD.  Pre-operative Diagnosis: Atherosclerotic occlusive disease bilateral lower extremities with ulceration of the left foot  Post-operative diagnosis: Same  Procedure(s) Performed: 1. Introduction catheter into left lower extremity 3rd order catheter placement  2. Contrast injection left lower extremity for distal runoff   3. Percutaneous transluminal angioplasty and stent placement left superficial femoral artery and popliteal 4. Percutaneous transluminal angioplasty left anterior tibial artery to 3 mm              5.  Star close closure right common femoral arteriotomy  Anesthesia: Conscious sedation was administered under my direct supervision by the interventional radiology RN. IV Versed plus fentanyl were utilized. Continuous ECG, pulse oximetry and blood pressure was monitored throughout the entire procedure.  Conscious sedation was for a total of 107 minutes.  Sheath: 6 [place] Ansell right common femoral retrograde  Contrast: 100 cc  Fluoroscopy Time: 10.8 minutes  Indications: [name] presents with ulceration of the left second toe and signs of sepsis.  This represents limb threatening ischemia.  The risks and benefits are reviewed all questions answered patient agrees to proceed.  Procedure: [name] is a 62 y.o. y.o. female who was identified and appropriate procedural time out was performed. The patient was then placed supine on the table and prepped and draped in the usual sterile fashion.   Ultrasound was placed in the sterile sleeve and the right groin was evaluated the right common femoral artery was echolucent and pulsatile indicating patency.  Image was recorded for the permanent record and under real-time visualization a microneedle was inserted  into the common femoral artery microwire followed by a micro-sheath.  A J-wire was then advanced through the micro-sheath and a  5 [place] sheath was then inserted over a J-wire. J-wire was then advanced and a 5 French pigtail catheter was positioned at the level of T12. AP projection of the aorta was then obtained. Pigtail catheter was repositioned to above the bifurcation and a RAO view of the pelvis was obtained.  Subsequently a pigtail catheter with the stiff angle Glidewire was used to cross the aortic bifurcation the catheter wire were advanced down into the left distal external iliac artery. Oblique view of the femoral bifurcation was then obtained and subsequently the wire was reintroduced and the pigtail catheter negotiated into the SFA representing third order catheter placement. Distal runoff was then performed.  5000 units of heparin was then given and allowed to circulate and a 6 [place] Ansell sheath was advanced up and over the bifurcation and positioned in the femoral artery  KMP  catheter and stiff angle Glidewire were then negotiated down into the distal popliteal.  Distal runoff was then completed by hand injection through the catheter.   Magic torque wires then reintroduced and negotiated down into the peroneal.  The 3 lesions noted in the SFA and popliteal are now addressed in sequence.  The more proximal lesion is approximately 70% the mid lesion just above Hunter's canal is approximately 80% in the area within the mid to distal popliteal is a stenosis of greater than 90%.  The proximal lesion was treated first with a 6 mm x 40 mm Lutonix drug-eluting balloon.  Following angioplasty to 10 atm for 2 minutes there is greater than 60% residual stenosis and therefore a 7 mm x 40 mm life stent is deployed and postdilated with a  6 mm balloon.  Follow-up imaging demonstrates that there is less than 5% residual stenosis.  The mid lesion is treated initially with a 4 mm ultra score balloon inflated  to 12 atm for 1 minute.  Subsequently a 5 mm Lutonix balloon is inflated for 2 minutes to 12 atm.  Follow-up imaging demonstrates greater than 70% residual stenosis and therefore a 6 mm life stent is deployed and postdilated with a 5 mm Lutonix drug-eluting balloon.  Follow-up imaging demonstrates less than 10% residual stenosis.  The popliteal lesion is then addressed and in similar fashion a 4 mm ultra score balloon was used to pretreat subsequently a 4 mm Lutonix drug-eluting balloon is inflated for 2 minutes to 12 atm.  Follow-up imaging in the midportion demonstrates greater than 50% residual stenosis and a 5 mm life stent is deployed and postdilated with a 4 mm Lutonix drug-eluting balloon.  Follow-up imaging demonstrates less than 10% residual stenosis.  Vertebral catheter is then reintroduced and the Glidewire is negotiated into the anterior tibial.  Wires then exchanged through the cath for a Magic torque wire.  A 3 mm x 40 mm ultra versed balloon is then advanced across the origin of the anterior tibial extending approximately 20 mm into the anterior tibial.  Inflation is to 14 atm for 1 minute.  Follow-up imaging demonstrates less than 10% residual stenosis and maintenance of distal flow.  There is now two-vessel posterior tibial and anterior tibial flow to the foot.  After review of these images the sheath is pulled into the right external iliac oblique of the common femoral is obtained and a Star close device deployed. There no immediate complications.   Findings: The abdominal aorta is opacified with a bolus injection contrast. Renal arteries are single and widely patent. The aorta itself has diffuse disease but no hemodynamically significant lesions. The common and external iliac arteries are widely patent bilaterally.  The left common femoral is widely patent as is the profunda femoris.  The SFA does indeed have a significant stenosis at multiple locations as described above.  The distal  popliteal demonstrates increasing disease and the trifurcation is heavily diseased with greater than 90% stenosis of the anterior tibial, the peroneal demonstrates diffuse disease with a small caliber that does not contribute much to the flow to the foot and the posterior tibial is patent without hemodynamically significant stenosis and fills the lateral plantar.  On initial imaging the posterior tibial is the dominant tibial vessel to the foot.    Following angioplasty anterior tibial now is in-line flow and looks quite nice with less than 5% residual stenosis and is now equally contributing to the flow to the foot with the posterior tibial. Angioplasty of the SFA at Hunter's canal yields an excellent result with less than 10% residual stenosis.    Summary: Successful recanalization left lower extremity for limb salvage    Disposition: Patient was taken to the recovery room in stable condition having tolerated the procedure well.  [name], [name] 10/06/2018,7:49 PM

## 2018-10-06 NOTE — Progress Notes (Signed)
Inpatient Diabetes Program Recommendations  AACE/ADA: New Consensus Statement on Inpatient Glycemic Control (2019)  Target Ranges:  Prepandial:   less than 140 mg/dL      Peak postprandial:   less than 180 mg/dL (1-2 hours)      Critically ill patients:  140 - 180 mg/dL   Results for SHAVONNE, AMBROISE (MRN [id number]) as of 10/06/2018 08:31  Ref. Range 10/05/2018 07:22 10/05/2018 12:13 10/05/2018 13:12 10/05/2018 16:48 10/05/2018 21:36 10/06/2018 08:19  Glucose-Capillary Latest Ref Range: 70 - 99 mg/dL 153 (H) 239 (H) 209 (H)  Novolog 7 units 286 (H)  Novolog 14 units 213 (H)  Novolog 2 units 186 (H)   Review of Glycemic Control  Diabetes history: DM2 Outpatient Diabetes medications: Trulicity 7.58 mg Qweek (NOT taking), Amaryl 4 mg BID, Lantus 30 units QHS Current orders for Inpatient glycemic control: Lantus 20 units daily, Novolog 3 units TID with meals, Novolog 0-20 units TID with meals, Novolog 0-5 units QHS  Inpatient Diabetes Program Recommendations:  Insulin - Basal: Noted Lantus was NOT GIVEN on 10/05/18 which is likely cause of noted hyperglycemia. NURSING: Please administer Lantus as ordered unless MD orders otherwise.  Thanks, Barnie Alderman, RN, MSN, CDE Diabetes Coordinator Inpatient Diabetes Program [phone] (Team Pager from 8am to 5pm)

## 2018-10-06 NOTE — Plan of Care (Signed)
  Problem: Activity: Goal: Risk for activity intolerance will decrease Outcome: Not Progressing   Problem: Pain Managment: Goal: General experience of comfort will improve Outcome: Not Progressing   

## 2018-10-06 NOTE — Care Management Important Message (Signed)
Copy of signed Medicare IM left with patient in room. 

## 2018-10-06 NOTE — Consult Note (Signed)
[facility] Vascular Consult Note  MRN : [id number]  [name] is a 62 y.o. (1956-06-15) female who presents with chief complaint of  Chief Complaint  Patient presents with  . Code Sepsis  .  History of Present Illness:   I was contacted by phone and personally asked to see the patient by Dr. Elvina Mattes this morning.  The patient is a 62 year old woman who was admitted to the hospital for evaluation of a necrotic left second toe.  She was admitted approximately 1 week ago with fever of 102 Fahrenheit and elevation in her white blood cell count.  Her troponins were also mildly elevated.  Initially cardiology evaluated her she underwent cardiac cath no intervention is indicated at this time and she is now undergoing further work-up for her left lower extremity infection and ulceration.     Current Meds  Medication Sig  . AMBULATORY NON FORMULARY MEDICATION 1 Units by Other route once. Medication Name: foot brace  . Calcium Carb-Cholecalciferol (CALCIUM 600 + D PO) Take 1 tablet by mouth 2 (two) times daily.  . citalopram (CELEXA) 20 MG tablet Take 1 tablet (20 mg total) by mouth daily.  . clopidogrel (PLAVIX) 75 MG tablet Take 1 tablet (75 mg total) by mouth daily. (Patient taking differently: Take 75 mg by mouth at bedtime. )  . ferrous sulfate 325 (65 FE) MG tablet Take 325 mg by mouth daily.  Marland Kitchen glimepiride (AMARYL) 4 MG tablet Take 1 tablet (4 mg total) by mouth 2 (two) times daily with a meal.  . glucose blood (ONE TOUCH ULTRA TEST) test strip Check sugar three times daily dx: DMII uncontrolled insulin dependent with complications  . Insulin Glargine (LANTUS) 100 UNIT/ML Solostar Pen Inject 30 Units into the skin at bedtime.  . Insulin Pen Needle (B-D ULTRAFINE III SHORT PEN) 31G X 8 MM MISC USE AS DIRECTED WITH LANTUS; dx: DMII uncontrolled, insulin dependent, with complications  . losartan (COZAAR) 25 MG tablet Take 25 mg by mouth at bedtime.  Marland Kitchen  omega-3 acid ethyl esters (LOVAZA) 1 g capsule Take 1 g by mouth 2 (two) times daily.  Marland Kitchen torsemide (DEMADEX) 20 MG tablet Take 1 tablet (20 mg total) by mouth daily. (Patient taking differently: Take 20 mg by mouth at bedtime. )  . traMADol (ULTRAM) 50 MG tablet Take 1 tablet (50 mg total) by mouth every 6 (six) hours as needed. for pain (Patient taking differently: Take 50 mg by mouth every 6 (six) hours as needed for moderate pain. )    Past Medical History:  Diagnosis Date  . Acute myocardial infarction, subendocardial infarction, initial episode of care ([place]) 07/21/2012  . Acute osteomyelitis involving ankle and foot ([place]) 02/06/2015  . Acute systolic heart failure ([place]) 07/21/2012   New onset 07/19/12; admission to [facility] ED. Elevated Troponins.  S/p 2D-echo with EF 20-25%.  S/p cardiac catheterization with stenting LAD.  Repeat 2D-echo 10/2011 with improved EF of 35%.   . Anemia   . Automatic implantable cardioverter-defibrillator in situ    a. MDT CRT-D 06/2014, SN: [id number]    . CAD (coronary artery disease)    a. cardiac cath 101/04/2012: PCI/DES to chronically occluded mLAD, consideration PCI to diag branch in 4 weeks.   . Cataract   . Chicken pox   . Chronic systolic CHF (congestive heart failure) (HCC)    a. mixed ICM & NICM; b. EF 20-25% by echo 07/2012, mid-dist 2/3 of LV sev HK/AK, mild MR.  echo 10/2012: EF 30-35%, sev HK ant-septal & inf walls, GR1DD, mild MR, PASP 33. c. echo 02/2013: EF 30%, GR1DD, mild MR. echo 04/2014: EF 30%, Septal-lat dyssynchrony, global HK, inf AK, GR1DD, mild MR. d. echo 10/2014: EF50-55%, WM nl, GR1DD, septal mild paradox. e. echo 02/2015: EF 50-55%, wm   . Depression   . Heart attack ([place])   . Heel ulcer ([place]) 04/27/2015  . History of blood transfusion ~ 2011   "plasma; had neuropathy; couldn't walk"  . Hypertension   . LBBB (left bundle branch block)   . Neuromuscular disorder ([place])   . Neuropathy 2011  . Obesity, unspecified   . OSA on CPAP     Moderate with AHI 23/hr and now on CPAP at 16cm H2O.  Her DME is [facility]  . Pure hypercholesterolemia   . Type II diabetes mellitus ([place])   . Unspecified vitamin D deficiency     Past Surgical History:  Procedure Laterality Date  . AMPUTATION TOE Right 06/18/2015   Procedure: AMPUTATION TOE;  Surgeon: Samara Deist, DPM;  Location: [facility] ORS;  Service: Podiatry;  Laterality: Right;  . BACK SURGERY    . BI-VENTRICULAR IMPLANTABLE CARDIOVERTER DEFIBRILLATOR N/A 07/06/2014   Procedure: BI-VENTRICULAR IMPLANTABLE CARDIOVERTER DEFIBRILLATOR  (CRT-D);  Surgeon: Deboraha Sprang, MD;  Location: [facility] CATH LAB;  Service: Cardiovascular;  Laterality: N/A;  . BI-VENTRICULAR IMPLANTABLE CARDIOVERTER DEFIBRILLATOR  (CRT-D)  07/06/2014  . BILATERAL OOPHORECTOMY  01/2011   ovarian cyst benign  . COLONOSCOPY WITH PROPOFOL Left 02/22/2015   Procedure: COLONOSCOPY WITH PROPOFOL;  Surgeon: Hulen Luster, MD;  Location: [facility] ENDOSCOPY;  Service: Endoscopy;  Laterality: Left;  . CORONARY ANGIOPLASTY WITH STENT PLACEMENT Left 07/2012   new onset systolic CHF; elevated troponins.  Cardiac catheterization with stenting to LAD; EF 15%.  2D-echo: EF 20-25%.  . ESOPHAGOGASTRODUODENOSCOPY N/A 02/22/2015   Procedure: ESOPHAGOGASTRODUODENOSCOPY (EGD);  Surgeon: Hulen Luster, MD;  Location[facility] ENDOSCOPY;  Service: Endoscopy;  Laterality: N/A;  . INCISION AND DRAINAGE ABSCESS Right 2007   groin; with ICU stay due to sepsis.  Marland Kitchen LAPAROSCOPIC CHOLECYSTECTOMY  2011  . LEFT HEART CATH AND CORONARY ANGIOGRAPHY N/A 10/05/2018   Procedure: LEFT HEART CATH AND CORONARY ANGIOGRAPHY;  Surgeon: Minna Merritts, MD;  Location: [facility] CV LAB;  Service: Cardiovascular;  Laterality: N/A;  . LEFT HEART CATHETERIZATION WITH CORONARY ANGIOGRAM N/A 07/21/2012   Procedure: LEFT HEART CATHETERIZATION WITH CORONARY ANGIOGRAM;  Surgeon: Jolaine Artist, MD;  Location: [facility] CATH LAB;  Service: Cardiovascular;  Laterality: N/A;  . [place]   L4-5  . PERCUTANEOUS CORONARY STENT INTERVENTION (PCI-S) N/A 07/23/2012   Procedure: PERCUTANEOUS CORONARY STENT INTERVENTION (PCI-S);  Surgeon: Sherren Mocha, MD;  Location: [facility] CATH LAB;  Service: Cardiovascular;  Laterality: N/A;  . PERIPHERAL VASCULAR CATHETERIZATION N/A 02/10/2015   Procedure: Picc Line Insertion;  Surgeon: Katha Cabal, MD;  Location: [facility] CV LAB;  Service: Cardiovascular;  Laterality: N/A;  . TEE WITHOUT CARDIOVERSION N/A 10/02/2018   Procedure: TRANSESOPHAGEAL ECHOCARDIOGRAM (TEE);  Surgeon: Wellington Hampshire, MD;  Location: [facility] ORS;  Service: Cardiovascular;  Laterality: N/A;  . [place]  . VAGINAL HYSTERECTOMY  01/2011   Fibroids/DUB.  Ovaries removed. [place].    Social History Social History   Tobacco Use  . Smoking status: Never Smoker  . Smokeless tobacco: Never Used  Substance Use Topics  . Alcohol use: No    Alcohol/week: 0.0 standard drinks  . Drug use:  No    Family History Family History  Problem Relation Age of Onset  . Diabetes Mother   . Hypertension Mother   . Arthritis Mother        knees, lumbar DDD, cervical DDD  . Cancer Father        prostate,skin,lymphoma.  . Cancer Brother 52       bladder cancer; non-smoker  . Diabetes Maternal Grandmother   . Heart disease Maternal Grandmother   . COPD Maternal Grandmother   . Diabetes Paternal Grandmother   . Obesity Brother   . Diabetes Son   . Hypertension Son   . Cancer Maternal Grandfather   . Diabetes Paternal Grandfather   No family history of bleeding/clotting disorders, porphyria or autoimmune disease   No Known Allergies   REVIEW OF SYSTEMS (Negative unless checked)  Constitutional: [] Weight loss  [] Fever  [] Chills Cardiac: [] Chest pain   [] Chest pressure   [] Palpitations   [] Shortness of breath when laying flat   [] Shortness of breath at rest   [] Shortness of breath with exertion. Vascular:  [x] Pain in legs with walking   [x] Pain in legs  at rest   [] Pain in legs when laying flat   [] Claudication   [] Pain in feet when walking  [] Pain in feet at rest  [] Pain in feet when laying flat   [] History of DVT   [] Phlebitis   [] Swelling in legs   [] Varicose veins   [x] Non-healing ulcers Pulmonary:   [] Uses home oxygen   [] Productive cough   [] Hemoptysis   [] Wheeze  [] COPD   [] Asthma Neurologic:  [] Dizziness  [] Blackouts   [] Seizures   [] History of stroke   [] History of TIA  [] Aphasia   [] Temporary blindness   [] Dysphagia   [] Weakness or numbness in arms   [] Weakness or numbness in legs Musculoskeletal:  [] Arthritis   [] Joint swelling   [] Joint pain   [] Low back pain Hematologic:  [] Easy bruising  [] Easy bleeding   [] Hypercoagulable state   [] Anemic  [] Hepatitis Gastrointestinal:  [] Blood in stool   [] Vomiting blood  [] Gastroesophageal reflux/heartburn   [] Difficulty swallowing. Genitourinary:  [] Chronic kidney disease   [] Difficult urination  [] Frequent urination  [] Burning with urination   [] Blood in urine Skin:  [x] Rashes   [x] Ulcers   [x] Wounds Psychological:  [] History of anxiety   []  History of major depression.  Physical Examination  Vitals:   10/06/18 1642 10/06/18 1659 10/06/18 1711 10/06/18 1736  BP: (!) 129/57 136/63 (!) 121/56 (!) 136/55  Pulse: 60 60 62 (!) 59  Resp: 18 17 18 18   Temp:    97.8 F (36.6 C)  TempSrc:    Oral  SpO2: 94% 96% (!) 80% 94%  Weight:      Height:       Body mass index is 44.15 kg/m. Gen:  WD/WN, NAD Head: [place]/AT, No temporalis wasting. Prominent temp pulse not noted. Ear/Nose/Throat: Hearing grossly intact, nares w/o erythema or drainage, oropharynx w/o Erythema/Exudate Eyes: PERRLA, EOMI.  Neck: Supple, no nuchal rigidity.  No bruit or JVD.  Pulmonary:  Good air movement, clear to auscultation bilaterally.  Cardiac: RRR, normal S1, S2, no Murmurs, rubs or gallops. Vascular: Ulceration left second toe Vessel Right Left  Radial Palpable Palpable  PT Not Palpable Not Palpable  DP Not  Palpable Not Palpable   Gastrointestinal: soft, non-tender/non-distended. No guarding/reflex. No masses, surgical incisions, or scars. Musculoskeletal: M/S 5/5 throughout.  Extremities without ischemic changes.  No deformity or atrophy. No edema. Neurologic: CN 2-12 intact. Pain  and light touch intact in extremities.  Symmetrical.  Speech is fluent. Motor exam as listed above. Psychiatric: Judgment intact, Mood & affect appropriate for pt's clinical situation. Dermatologic: + rashes left foot ulcers noted.  Positive cellulitis with open wounds. Lymph : No Cervical, Axillary, or Inguinal lymphadenopathy.   CBC Lab Results  Component Value Date   WBC 7.3 10/05/2018   HGB 10.9 (L) 10/05/2018   HCT 33.1 (L) 10/05/2018   MCV 90.7 10/05/2018   PLT 165 10/05/2018    BMET    Component Value Date/Time   NA 133 (L) 10/06/2018 0433   NA 140 07/20/2018 1728   K 3.6 10/06/2018 0433   CL 101 10/06/2018 0433   CO2 23 10/06/2018 0433   GLUCOSE 197 (H) 10/06/2018 0433   BUN 25 (H) 10/06/2018 0433   BUN 43 (H) 07/20/2018 1728   CREATININE 1.35 (H) 10/06/2018 0433   CREATININE 1.51 (H) 08/06/2016 1431   CALCIUM 7.7 (L) 10/06/2018 0433   GFRNONAA 41 (L) 10/06/2018 0433   GFRNONAA 46 (L) 12/21/2013 1132   GFRAA 48 (L) 10/06/2018 0433   GFRAA 53 (L) 12/21/2013 1132   Estimated Creatinine Clearance: 58.6 mL/min (A) (by C-G formula based on SCr of 1.35 mg/dL (H)).  COAG Lab Results  Component Value Date   INR 1.45 09/29/2018   INR 1.52 09/28/2018   INR 0.96 11/11/2015    Radiology Dg Chest Port 1 View  Result Date: 09/30/2018 CLINICAL DATA:  Acute respiratory failure. EXAM: PORTABLE CHEST 1 VIEW COMPARISON:  Multiple recent previous exams. FINDINGS: 0515 hours. Low volume film. No edema. Left upper lobe nodular density is new since 09/28/2018. Interstitial markings are diffusely coarsened with chronic features. Cardiopericardial silhouette is at upper limits of normal for size. Left  pacer/AICD again noted. Telemetry leads overlie the chest. IMPRESSION: New left upper lobe nodular density. Given the relatively rapid interval appearance, this may be external to the patient or confluence of shadows. Close attention this region on follow-up recommended. Otherwise stable. Electronically Signed   By: Misty Stanley M.D.   On: 09/30/2018 07:13   Dg Chest Port 1 View  Result Date: 09/29/2018 CLINICAL DATA:  Acute onset of respiratory failure. EXAM: PORTABLE CHEST 1 VIEW COMPARISON:  Chest radiograph performed 09/28/2018 FINDINGS: There is mild elevation of the left hemidiaphragm. Vascular congestion is noted. Right midlung and right basilar airspace opacity may reflect pneumonia or asymmetric pulmonary edema. No definite pleural effusion or pneumothorax is seen. The cardiomediastinal silhouette is normal in size. A pacemaker/AICD is noted overlying the left chest wall, with leads ending overlying the right atrium, right ventricle and coronary sinus. No acute osseous abnormalities are identified. IMPRESSION: Mild elevation of the left hemidiaphragm. Vascular congestion noted. Right midlung and right basilar airspace opacity may reflect pneumonia or asymmetric pulmonary edema. Electronically Signed   By: Garald Balding M.D.   On: 09/29/2018 05:26   Dg Chest Port 1 View  Result Date: 09/28/2018 CLINICAL DATA:  Pt with altered mental status and elevated temp emesis x 1 day Pt not able to get OOB the last few days Infection to left second toe noted. Hx of diabetes, hypertension, heart attack, CHF, CAD, defibrillator. Non smokerams. 2nd toe swollen EXAM: PORTABLE CHEST 1 VIEW COMPARISON:  02/24/2018 FINDINGS: LEFT-sided pacemaker overlies normal cardiac silhouette. Low lung volumes. No effusion, infiltrate pneumothorax. No acute osseous abnormality. IMPRESSION: Low lung volumes.  No acute findings Electronically Signed   By: Suzy Bouchard M.D.   On: 09/28/2018 12:57  Dg Foot Complete  Left  Result Date: 09/28/2018 CLINICAL DATA:  Second toe swelling.  Altered mental status. EXAM: LEFT FOOT - COMPLETE 3+ VIEW COMPARISON:  05/03/2016. FINDINGS: There is diffuse soft tissue swelling involving the forefoot. Periarticular bony resorption at the second PIP joint is identified and may be slightly progressive when compared with 05/03/2016. No acute fractures or dislocations identified. Moderate degenerative changes within the midfoot identified. Plantar heel spur noted. IMPRESSION: 1. Soft tissue swelling and periarticular bony resorption identified at the second PIP joint. Findings are compatible with either inflammatory or infectious arthropathy. Electronically Signed   By: Kerby Moors M.D.   On: 09/28/2018 13:03     Assessment/Plan 1.  Atherosclerotic occlusive disease bilateral lower extremities with ulceration left:  Recommend:  The patient has evidence of severe atherosclerotic changes of both lower extremities associated with ulceration and tissue loss of the foot.  This represents a limb threatening ischemia and places the patient at the risk for limb loss.  Patient should undergo angiography of the lower extremities with the hope for intervention for limb salvage.  The risks and benefits as well as the alternative therapies was discussed in detail with the patient.  All questions were answered.  Patient agrees to proceed with angiography.  The patient will follow up with me in the office after the procedure.   2.  Coronary artery disease: The patient is status post cardiac catheterization.  No intervention or surgery is indicated at this time.  Continue cardiac and antihypertensive medications as already ordered and reviewed, no changes at this time.  Continue statin as ordered and reviewed, no changes at this time  Nitrates PRN for chest pain  3.  Diabetes mellitus: Continue hypoglycemic medications as already ordered, these medications have been reviewed and there  are no changes at this time.  Hgb A1C to be monitored as already arranged by primary service  4.  Hypercholesterolemia: Continue statin as ordered and reviewed, no changes at this time    Hortencia Pilar, MD  10/06/2018 7:42 PM

## 2018-10-06 NOTE — Progress Notes (Signed)
Progress Note  Patient Name: [name] Date of Encounter: 10/06/2018  Primary Cardiologist: Esmond Plants, MD PhD  Subjective   Patient notes chronic back pain.  No chest pain or shortness of breath.  No pain or bleeding at right groin cath site.  Inpatient Medications    Scheduled Meds: . aspirin EC  81 mg Oral Daily  . chlorhexidine  15 mL Mouth Rinse BID  . insulin aspart  0-20 Units Subcutaneous TID WC  . insulin aspart  0-5 Units Subcutaneous QHS  . insulin aspart  3 Units Subcutaneous TID WC  . insulin glargine  20 Units Subcutaneous Daily  . lidocaine  1 patch Transdermal Q24H  . losartan  25 mg Oral Daily  . mouth rinse  15 mL Mouth Rinse q12n4p  . methocarbamol  500 mg Oral TID  . metoprolol tartrate  25 mg Oral Q6H  . rosuvastatin  40 mg Oral q1800  . sodium chloride flush  3 mL Intravenous Q12H   Continuous Infusions: . sodium chloride Stopped (10/02/18 0322)  . amiodarone 30 mg/hr (10/05/18 2250)  .  ceFAZolin (ANCEF) IV 2 g (10/06/18 0211)   PRN Meds: sodium chloride, acetaminophen **OR** acetaminophen, [DISCONTINUED] ondansetron **OR** ondansetron (ZOFRAN) IV, promethazine, sodium chloride flush, traMADol   Vital Signs    Vitals:   10/05/18 1310 10/05/18 1523 10/05/18 2018 10/06/18 0456  BP: (!) 147/57 (!) 131/49 (!) 121/57 (!) 122/53  Pulse: (!) 59 64 72 63  Resp: 19 18 18 18   Temp: 98.5 F (36.9 C) 98.2 F (36.8 C) 98.8 F (37.1 C) 98.1 F (36.7 C)  TempSrc: Oral Oral Oral Oral  SpO2: 98% 99% 90% 99%  Weight:      Height:        Intake/Output Summary (Last 24 hours) at 10/06/2018 0755 Last data filed at 10/06/2018 0500 Gross per 24 hour  Intake 754.73 ml  Output 500 ml  Net 254.73 ml   Filed Weights   09/29/18 0500 10/04/18 0403 10/05/18 0300  Weight: 124.9 kg 128.4 kg 127.9 kg    Telemetry    NSR with V-pacing and AV pacing.  Isolated PVC's.  - Personally Reviewed  ECG    AV-paced - Personally Reviewed  Physical Exam     GEN: No acute distress.   Neck: No JVD Cardiac: RRR, no murmurs, rubs, or gallops.  Respiratory: Clear to auscultation bilaterally. GI: Soft, nontender, non-distended  MS: Trace BLE edema.  Left foot wrapped with clean gauze. Neuro:  Nonfocal  Psych: Normal affect   Labs    Chemistry Recent Labs  Lab 09/30/18 0540 10/01/18 0647  10/04/18 0434 10/05/18 0350 10/06/18 0433  NA 137 136   < > 135 134* 133*  K 3.4* 3.6   < > 3.6 4.1 3.6  CL 105 105   < > 103 101 101  CO2 22 24   < > 23 24 23   GLUCOSE 167* 162*   < > 190* 168* 197*  BUN 59* 47*   < > 29* 27* 25*  CREATININE 1.75* 1.25*   < > 1.20* 1.34* 1.35*  CALCIUM 8.0* 8.0*   < > 7.7* 7.9* 7.7*  PROT 5.6* 5.5*  --   --   --   --   ALBUMIN 2.6* 2.6*  --   --   --   --   AST 100* 41  --   --   --   --   ALT 45* 26  --   --   --   --  ALKPHOS 42 41  --   --   --   --   BILITOT 0.6 0.6  --   --   --   --   GFRNONAA 30* 45*   < > 48* 42* 41*  GFRAA 35* 53*   < > 55* 48* 48*  ANIONGAP 10 7   < > 9 9 9    < > = values in this interval not displayed.     Hematology Recent Labs  Lab 10/03/18 0418 10/04/18 0434 10/05/18 0350  WBC 10.2 8.2 7.3  RBC 3.61* 3.54* 3.65*  HGB 10.7* 10.4* 10.9*  HCT 32.8* 31.6* 33.1*  MCV 90.9 89.3 90.7  MCH 29.6 29.4 29.9  MCHC 32.6 32.9 32.9  RDW 13.1 13.0 12.9  PLT 80* 123* 165    Cardiac EnzymesNo results for input(s): TROPONINI in the last 168 hours. No results for input(s): TROPIPOC in the last 168 hours.   BNPNo results for input(s): BNP, PROBNP in the last 168 hours.   DDimer No results for input(s): DDIMER in the last 168 hours.   Radiology    No results found.  Cardiac Studies   LHC (10/06/18): Severe disease of LAD and diagonal branch Patent proximal LAD stent with severe stenosis at the distal edge of the stent Remainder of the LAD is also small, diffusely diseased, diagonal branch diffusely diseased -RCA large vessel dominant with mild disease, left circumflex/OM  with mild disease proximal region  Diagnostic  Dominance: Right     TEE (10/02/18): - Left ventricle: Systolic function was mildly reduced. The   estimated ejection fraction was in the range of 45% to 50%. - Aortic valve: No evidence of vegetation. There was trivial   regurgitation. - Mitral valve: There was mild regurgitation. - Left atrium: No evidence of thrombus in the atrial cavity or   appendage. - Right atrium: No evidence of thrombus in the atrial cavity or   appendage. - Atrial septum: Echo contrast study showed no right-to-left atrial   level shunt, at baseline or with provocation. - Pulmonic valve: No evidence of vegetation.  TEE (09/29/18): - Left ventricle: The cavity size was normal. Systolic function was   mildly reduced. The estimated ejection fraction was in the range   of 45% to 50%. The study is not technically sufficient to allow   evaluation of LV diastolic function. - Left atrium: The atrium was mildly dilated.  Patient Profile     62 y.o. female with history of multivessel CAD with CTO of the LAD which was treated with PCI at that time but the distal vessel was small in size and there was diffuse dissection that was treated with PTCA. She also has history of chronic systolic CHF s/p ICD CRT in 2015 with subsequent normalization of EF, osteomyelitis, DM2, HTN, obesity and OSA on CPAP who we are seeing for NSTEMI and MSSA bacteremia.  Assessment & Plan    NSTEMI Most likely due to supply-demand mismatch.  Catheterization yesterday showed severe lesion in the mid LAD at distal margin of previously placed stent.  Mid/distal LAD is relatively small and diffusely diseased.  Patient without angina during this hospitalization.  Continue medical therapy.  Would advocate for DAPT with ASA and clopidogrel, if okay from a perioperative standpoint with upcoming toe amputation.  If not started prior to surgery, clopidogrel should be started when safe to do so following  surgery.  I am hesitant to perform PCI to LAD given recent MSSA bacteremia without source control,  as well as relatively small size of the LAD.  Once infection has cleared, this will need to be readdressed, particularly if symptoms of coronary insufficiency develop.  Continue rosuvastatin 40 mg daily.  Tachycardia No further events on telemetry over the last 24 hours.  Continue metoprolol tartrate 25 mg Q6 hours.  If no further arrhythmia today, would favor discontinuation of amiodarone infusion.  MSSA bacteremia Most likely due to left toe infection.  TEE did not show vegetation, but patient is certainly at high risk for seeding of pacemaker/ICD leads.  Proceed with toe amputation per podiatry.  While patient is at high risk from a cardiac standpoint in the setting of recent type 2 MI, I believe that the risks of PCI to the LAD outweigh the benefits.  Continue antibiotics per ID.  Would appreciate ID's thoughts regarding need for device/lead extraction.  I agree with vascular surgery evaluation to exclude significant PAD contributing to toe infections.  Acute on chronic HFpEF LVEF mildly reduced by TTE and TEE.  Patient with trace LE edema but otherwise appears euvolemic.  LVEDP mildly elevated at ~20 mmHg.  Maintain net even to slightly negative fluid balance.  Continue metoprolol and losartan.  Thrombocytopenia Improving as of yesterday's CBC.  Continue to monitor.  Heparin being avoided, though heparin induced platelet antibody within reference range.  For questions or updates, please contact [place] Please consult [email or web address] for contact info under [facility]  Signed, Nelva Bush, MD  10/06/2018, 7:55 AM

## 2018-10-06 NOTE — Progress Notes (Signed)
ID MSSA bacteremia MSSA left 2 nd toe infection Awaiting amputation Underwent angio of the legs today On cefazolin 2 grams IV q 8 Likely will need for total of 6 weeks CBC Latest Ref Rng & Units 10/05/2018 10/04/2018 10/03/2018  WBC 4.0 - 10.5 K/uL 7.3 8.2 10.2  Hemoglobin 12.0 - 15.0 g/dL 10.9(L) 10.4(L) 10.7(L)  Hematocrit 36.0 - 46.0 % 33.1(L) 31.6(L) 32.8(L)  Platelets 150 - 400 K/uL 165 123(L) 80(L)   CMP Latest Ref Rng & Units 10/06/2018 10/05/2018 10/04/2018  Glucose 70 - 99 mg/dL 197(H) 168(H) 190(H)  BUN 8 - 23 mg/dL 25(H) 27(H) 29(H)  Creatinine 0.44 - 1.00 mg/dL 1.35(H) 1.34(H) 1.20(H)  Sodium 135 - 145 mmol/L 133(L) 134(L) 135  Potassium 3.5 - 5.1 mmol/L 3.6 4.1 3.6  Chloride 98 - 111 mmol/L 101 101 103  CO2 22 - 32 mmol/L 23 24 23   Calcium 8.9 - 10.3 mg/dL 7.7(L) 7.9(L) 7.7(L)  Total Protein 6.5 - 8.1 g/dL - - -  Total Bilirubin 0.3 - 1.2 mg/dL - - -  Alkaline Phos 38 - 126 U/L - - -  AST 15 - 41 U/L - - -  ALT 0 - 44 U/L - - -

## 2018-10-06 NOTE — Progress Notes (Signed)
[name] at [place] NAME: [name]    MR#:  [id number]  DATE OF BIRTH:  17-Feb-1956  SUBJECTIVE:  CHIEF COMPLAINT:   Chief Complaint  Patient presents with  . Code Sepsis   -Still has low back pain.  Denying any other complaints -Going for angiogram today and plan for possible left foot second toe amputation on Thursday  REVIEW OF SYSTEMS:  Review of Systems  Constitutional: Negative for chills, fever and malaise/fatigue.  HENT: Negative for congestion, ear discharge, hearing loss and nosebleeds.   Eyes: Negative for blurred vision and double vision.  Respiratory: Negative for cough and shortness of breath.   Cardiovascular: Negative for chest pain, palpitations and leg swelling.  Gastrointestinal: Negative for abdominal pain, heartburn, nausea and vomiting.  Genitourinary: Negative for dysuria and urgency.  Musculoskeletal: Positive for back pain and myalgias.  Neurological: Negative for dizziness, focal weakness, weakness and headaches.  Psychiatric/Behavioral: Negative for depression.    DRUG ALLERGIES:  No Known Allergies  VITALS:  Blood pressure 127/63, pulse 60, temperature 98.2 F (36.8 C), temperature source Oral, resp. rate 19, height 5\' 7"  (1.702 m), weight 127.9 kg, SpO2 97 %.  PHYSICAL EXAMINATION:  Physical Exam   GENERAL:  62 y.o.-year-old obese patient lying in the bed with no acute distress.  EYES: Pupils equal, round, reactive to light and accommodation. No scleral icterus. Extraocular muscles intact.  HEENT: Head atraumatic, normocephalic. Oropharynx and nasopharynx clear.  NECK:  Supple, no jugular venous distention. No thyroid enlargement, no tenderness.  LUNGS: Normal breath sounds bilaterally, no wheezing, rales,rhonchi or crepitation. No use of accessory muscles of respiration. Decreased bibasilar breath sounds CARDIOVASCULAR: S1, S2 normal. No  rubs, or gallops. 2/6 systolic murmur present ICD on  left chest ABDOMEN: Soft, nontender, nondistended. Bowel sounds present. No organomegaly or mass.  EXTREMITIES: No pedal edema, cyanosis, or clubbing.  Left foot second toe swelling, decreased sensation but improved color -Right groin cardiac cath site with no active bleeding NEUROLOGIC: Cranial nerves II through XII are intact. Muscle strength 5/5 in all extremities. Sensation intact. Gait not checked. Global weakness noted. PSYCHIATRIC: The patient is alert and oriented x 3.  SKIN: No obvious rash, lesion, or ulcer.    LABORATORY PANEL:   CBC Recent Labs  Lab 10/05/18 0350  WBC 7.3  HGB 10.9*  HCT 33.1*  PLT 165   ------------------------------------------------------------------------------------------------------------------  Chemistries  Recent Labs  Lab 10/01/18 0647  10/02/18 1513  10/06/18 0433  NA 136   < >  --    < > 133*  K 3.6   < >  --    < > 3.6  CL 105   < >  --    < > 101  CO2 24   < >  --    < > 23  GLUCOSE 162*   < >  --    < > 197*  BUN 47*   < >  --    < > 25*  CREATININE 1.25*   < >  --    < > 1.35*  CALCIUM 8.0*   < >  --    < > 7.7*  MG  --   --  1.8  --   --   AST 41  --   --   --   --   ALT 26  --   --   --   --   ALKPHOS 41  --   --   --   --  BILITOT 0.6  --   --   --   --    < > = values in this interval not displayed.   ------------------------------------------------------------------------------------------------------------------  Cardiac Enzymes No results for input(s): TROPONINI in the last 168 hours. ------------------------------------------------------------------------------------------------------------------  RADIOLOGY:  No results found.  EKG:   Orders placed or performed during the hospital encounter of 09/28/18  . ED EKG 12-Lead  . ED EKG 12-Lead  . EKG 12-Lead  . EKG 12-Lead  . EKG 12-Lead  . EKG 12-Lead  . EKG 12-Lead  . EKG 12-Lead  . EKG 12-Lead  . EKG 12-Lead  . EKG 12-Lead  . EKG 12-Lead  . EKG  12-Lead  . EKG 12-Lead  . EKG 12-Lead  . EKG 12-Lead  . EKG 12-Lead  . EKG 12-Lead  . EKG 12-Lead  . EKG 12-Lead  . EKG 12-Lead  . EKG 12-Lead  . EKG 12-Lead  . EKG 12-Lead    ASSESSMENT AND PLAN:   62 year old female with past medical history significant for CAD status post PCI, chronic systolic CHF status post ICD, ischemic cardiomyopathy, diabetes, hypertension, obesity, sleep apnea on CPAP admitted for MSSA bacteremia and left foot second toe ulceration.  1.  Staph aureus bacteremia-blood culture showing MSSA, currently on IV Ancef -Has left foot second toe infection. -Repeat blood cultures from 09/29/2018 are negative so far.  Appreciate ID consult. -2D echo without any vegetations.  TEE done on 10/02/2018 showing no vegetations, and the ICD wires do not look infected.. -Afebrile at this time, WBC is within normal limits. -We will need left foot second toe amputated.  Going for angiogram today and possible amputation on 10/08/2018.  Appreciate vascular and podiatry consults -Discussed with ID after the surgery about duration of IV antibiotics.  2.  NSTEMI-known history of chronic LAD occlusive disease and stent in 2013 -For elevated troponin, patient had a repeat cardiac catheterization on 09/18/2018 that showed chronically occluded LAD which is a narrow vessel, cardiology recommended medical management at this time.  After the toe amputation Plavix can be added.  Recommended dual antiplatelet treatment.  Currently on aspirin, statin, Coreg and losartan -Chronic LAD occlusion further can be addressed as outpatient.  Appreciate cardiology consult   3.  Left foot second toe infection-cultures are growing MSSA -Appreciate podiatry consult.   -Source for her bacteremia.  It needs to be removed due to presence of ICD. -Plan for surgery on Thursday.  Physical therapy after surgery.  4.  Acute renal failure on CKD-avoid nephrotoxins.  Creatinine is much improved and stable   5.   Chronic systolic CHF-euvolemic at this time.  Diuretics on hold given renal failure -Continue cardiac medications including losartan, aspirin, Coreg and statin.  Entresto as outpatient if blood pressure permits  6.  Diabetes mellitus-on Lantus and sliding scale insulin.  7.  Tachycardia-patient is status post pacemaker placement -Started on amiodarone-  Converted to sinus rhythm.  Continue management per cardiology -If rhythm remains stable, cardiology recommended discontinuing amiodarone today  8.  DVT prophylaxis-teds and SCDs -had to stop the heparin due to drop in platelets.  Heparin-induced antibody assays pending.  Level of antibodies is within normal limits though, less likely to be HITT - could have been sepsis /acute illness induced thrombocytopenia  Physical therapy consulted  Husband at bedside   All the records are reviewed and case discussed with Care Management/Social Workerr. Management plans discussed with the patient, family and they are in agreement.  CODE STATUS: Full Code  TOTAL  TIME TAKING CARE OF THIS PATIENT: 31 minutes.   POSSIBLE D/C IN 5-6 DAYS, DEPENDING ON CLINICAL CONDITION.   Gladstone Lighter M.D on 10/06/2018 at 11:15 AM  Between 7am to 6pm - Pager - [phone]  After 6pm go to [email or web address] - password [email or web address] [facility]  Office  [phone]  CC: Primary care physician; Rutherford Guys, MD

## 2018-10-07 DIAGNOSIS — I739 Peripheral vascular disease, unspecified: Secondary | ICD-10-CM

## 2018-10-07 DIAGNOSIS — M009 Pyogenic arthritis, unspecified: Secondary | ICD-10-CM

## 2018-10-07 LAB — BASIC METABOLIC PANEL
Anion gap: 9 (ref 5–15)
BUN: 24 mg/dL — ABNORMAL HIGH (ref 8–23)
CO2: 23 mmol/L (ref 22–32)
Calcium: 7.6 mg/dL — ABNORMAL LOW (ref 8.9–10.3)
Chloride: 99 mmol/L (ref 98–111)
Creatinine, Ser: 1.46 mg/dL — ABNORMAL HIGH (ref 0.44–1.00)
GFR calc Af Amer: 44 mL/min — ABNORMAL LOW (ref >60)
GFR calc non Af Amer: 38 mL/min — ABNORMAL LOW (ref >60)
Glucose, Bld: 221 mg/dL — ABNORMAL HIGH (ref 70–99)
Potassium: 3.7 mmol/L (ref 3.5–5.1)
Sodium: 131 mmol/L — ABNORMAL LOW (ref 135–145)

## 2018-10-07 LAB — GLUCOSE, CAPILLARY
Glucose-Capillary: 205 mg/dL — ABNORMAL HIGH (ref 70–99)
Glucose-Capillary: 222 mg/dL — ABNORMAL HIGH (ref 70–99)
Glucose-Capillary: 208 mg/dL — ABNORMAL HIGH (ref 70–99)
Glucose-Capillary: 173 mg/dL — ABNORMAL HIGH (ref 70–99)

## 2018-10-07 LAB — CBC
HEMATOCRIT: 30.2 % — AB (ref 36.0–46.0)
Hemoglobin: 9.8 g/dL — ABNORMAL LOW (ref 12.0–15.0)
MCH: 29.2 pg (ref 26.0–34.0)
MCHC: 32.5 g/dL (ref 30.0–36.0)
MCV: 89.9 fL (ref 80.0–100.0)
NRBC: 0 % (ref 0.0–0.2)
Platelets: 187 10*3/uL (ref 150–400)
RBC: 3.36 MIL/uL — ABNORMAL LOW (ref 3.87–5.11)
RDW: 13 % (ref 11.5–15.5)
WBC: 13.3 10*3/uL — ABNORMAL HIGH (ref 4.0–10.5)

## 2018-10-07 MED ORDER — AMIODARONE HCL 200 MG PO TABS
400.0000 mg | ORAL_TABLET | Freq: Two times a day (BID) | ORAL | Status: DC
  Administered 2018-10-07 – 2018-10-09 (×5): 400 mg via ORAL
  Filled 2018-10-07 (×5): qty 2

## 2018-10-07 NOTE — Plan of Care (Signed)
  Problem: Clinical Measurements: Goal: Diagnostic test results will improve Outcome: Progressing Goal: Cardiovascular complication will be avoided Outcome: Progressing   Problem: Pain Managment: Goal: General experience of comfort will improve Outcome: Progressing   Problem: Cardiovascular: Goal: Vascular access site(s) Level 0-1 will be maintained Outcome: Progressing

## 2018-10-07 NOTE — Progress Notes (Signed)
Subjective  - POD #1, s/p angio  No complaints today   Physical Exam:  Right groin soft without hematoma Left 2nd toe ulcer       Assessment/Plan:  POD #1  Plan for toe amp tomorrow NPO after midnight Continue Plavix  [name] 10/07/2018 1:38 PM --  Vitals:   10/07/18 0850 10/07/18 1154  BP:  (!) 141/65  Pulse: 62 (!) 59  Resp:  19  Temp:  98 F (36.7 C)  SpO2:  100%    Intake/Output Summary (Last 24 hours) at 10/07/2018 1338 Last data filed at 10/07/2018 1045 Gross per 24 hour  Intake 453.03 ml  Output 1550 ml  Net -1096.97 ml     Laboratory CBC    Component Value Date/Time   WBC 13.3 (H) 10/07/2018 0351   HGB 9.8 (L) 10/07/2018 0351   HGB 12.3 04/15/2018 1532   HCT 30.2 (L) 10/07/2018 0351   HCT 38.0 04/15/2018 1532   PLT 187 10/07/2018 0351   PLT 178 04/15/2018 1532    BMET    Component Value Date/Time   NA 131 (L) 10/07/2018 0351   NA 140 07/20/2018 1728   K 3.7 10/07/2018 0351   CL 99 10/07/2018 0351   CO2 23 10/07/2018 0351   GLUCOSE 221 (H) 10/07/2018 0351   BUN 24 (H) 10/07/2018 0351   BUN 43 (H) 07/20/2018 1728   CREATININE 1.46 (H) 10/07/2018 0351   CREATININE 1.51 (H) 08/06/2016 1431   CALCIUM 7.6 (L) 10/07/2018 0351   GFRNONAA 38 (L) 10/07/2018 0351   GFRNONAA 46 (L) 12/21/2013 1132   GFRAA 44 (L) 10/07/2018 0351   GFRAA 53 (L) 12/21/2013 1132    COAG Lab Results  Component Value Date   INR 1.45 09/29/2018   INR 1.52 09/28/2018   INR 0.96 11/11/2015   No results found for: PTT  Antibiotics Anti-infectives (From admission, onward)   Start     Dose/Rate Route Frequency Ordered Stop   10/01/18 0900  ceFAZolin (ANCEF) IVPB 2g/100 mL premix     2 g 200 mL/hr over 30 Minutes Intravenous Every 8 hours 10/01/18 0813     09/30/18 2200  ceFAZolin (ANCEF) IVPB 2g/100 mL premix  Status:  Discontinued     2 g 200 mL/hr over 30 Minutes Intravenous Every 12 hours 09/30/18 1036 10/01/18 0813   09/29/18 2200  ceFAZolin  (ANCEF) IVPB 1 g/50 mL premix  Status:  Discontinued     1 g 100 mL/hr over 30 Minutes Intravenous Every 12 hours 09/29/18 1505 09/30/18 1036   09/29/18 1000  ceFAZolin (ANCEF) IVPB 2g/100 mL premix  Status:  Discontinued     2 g 200 mL/hr over 30 Minutes Intravenous Every 8 hours 09/29/18 0921 09/29/18 1505   09/29/18 0000  ceFEPIme (MAXIPIME) 2 g in sodium chloride 0.9 % 100 mL IVPB  Status:  Discontinued     2 g 200 mL/hr over 30 Minutes Intravenous Every 12 hours 09/28/18 1318 09/29/18 0919   09/28/18 1830  vancomycin (VANCOCIN) 1,250 mg in sodium chloride 0.9 % 250 mL IVPB  Status:  Discontinued     1,250 mg 166.7 mL/hr over 90 Minutes Intravenous Every 18 hours 09/28/18 1318 09/29/18 1235   09/28/18 1200  ceFEPIme (MAXIPIME) 2 g in sodium chloride 0.9 % 100 mL IVPB     2 g 200 mL/hr over 30 Minutes Intravenous  Once 09/28/18 1156 09/28/18 1301   09/28/18 1200  metroNIDAZOLE (FLAGYL) IVPB 500 mg  Status:  Discontinued     500 mg 100 mL/hr over 60 Minutes Intravenous Every 8 hours 09/28/18 1156 09/29/18 0919   09/28/18 1200  vancomycin (VANCOCIN) IVPB 1000 mg/200 mL premix     1,000 mg 200 mL/hr over 60 Minutes Intravenous  Once 09/28/18 1156 09/28/18 1339       V. Leia Alf, M.D. [facility] Office: [phone] Pager:  [phone]

## 2018-10-07 NOTE — Progress Notes (Signed)
Patient has active orders for fluids (0.9% sodium chloride infusion) going at 133ml/hr. MD paged to verify since patient has hx of CHF, per Dr. Jerelyn Charles stop the iv fluids (sodium chloride infusion). Will stop and continue to monitor patient.

## 2018-10-07 NOTE — Progress Notes (Signed)
Progress Note  Patient Name: [name] Date of Encounter: 10/07/2018  Primary Cardiologist: Esmond Plants, MD PhD  Subjective   She reports feeling rigidly cold last night, had nurses place extra blankets, turned the heat up in the room.  Later in the evening was " burning up"  On amiodarone infusion, antibiotics Denies significant shortness of breath or chest discomfort Has not been out of bed very much Denies any abdominal distention Feels improvement in her left foot, less discomfort   Inpatient Medications    Scheduled Meds: . amiodarone  400 mg Oral BID  . aspirin EC  81 mg Oral Daily  . chlorhexidine  15 mL Mouth Rinse BID  . insulin aspart  0-20 Units Subcutaneous TID WC  . insulin aspart  0-5 Units Subcutaneous QHS  . insulin aspart  3 Units Subcutaneous TID WC  . insulin glargine  20 Units Subcutaneous Daily  . lidocaine  1 patch Transdermal Q24H  . losartan  25 mg Oral Daily  . mouth rinse  15 mL Mouth Rinse q12n4p  . metoprolol tartrate  25 mg Oral Q6H  . rosuvastatin  40 mg Oral q1800  . sodium chloride flush  3 mL Intravenous Q12H  . sodium chloride flush  3 mL Intravenous Q12H   Continuous Infusions: . sodium chloride Stopped (10/02/18 0322)  . sodium chloride Stopped (10/07/18 1333)  .  ceFAZolin (ANCEF) IV 2 g (10/07/18 1218)   PRN Meds: sodium chloride, sodium chloride, acetaminophen **OR** acetaminophen, HYDROcodone-acetaminophen, morphine injection, [DISCONTINUED] ondansetron **OR** ondansetron (ZOFRAN) IV, oxyCODONE, promethazine, sodium chloride flush, sodium chloride flush, traMADol   Vital Signs    Vitals:   10/07/18 0850 10/07/18 1154 10/07/18 1551 10/07/18 1553  BP:  (!) 141/65 (!) 132/53 (!) 132/53  Pulse: 62 (!) 59 65 61  Resp:  19    Temp:  98 F (36.7 C)  98.6 F (37 C)  TempSrc:  Oral  Oral  SpO2:  100%  96%  Weight:      Height:        Intake/Output Summary (Last 24 hours) at 10/07/2018 1639 Last data filed at 10/07/2018  1045 Gross per 24 hour  Intake 453.03 ml  Output 1550 ml  Net -1096.97 ml   Filed Weights   10/04/18 0403 10/05/18 0300 10/07/18 0521  Weight: 128.4 kg 127.9 kg 132.9 kg    Telemetry    NSR with V-pacing and AV pacing.  - Personally Reviewed  ECG    - Personally Reviewed  Physical Exam   GEN: No acute distress.  Obese Neck: No JVD Cardiac: RRR, no murmurs, rubs, or gallops.  Respiratory: Clear to auscultation bilaterally. Scattered Rales at the bases GI: Soft, nontender, non-distended  MS: Trace BLE edema.  Left foot wrapped with clean gauze. Neuro:  Nonfocal  Psych: Normal affect , alert and oriented  Labs    Chemistry Recent Labs  Lab 10/01/18 0647  10/05/18 0350 10/06/18 0433 10/07/18 0351  NA 136   < > 134* 133* 131*  K 3.6   < > 4.1 3.6 3.7  CL 105   < > 101 101 99  CO2 24   < > 24 23 23   GLUCOSE 162*   < > 168* 197* 221*  BUN 47*   < > 27* 25* 24*  CREATININE 1.25*   < > 1.34* 1.35* 1.46*  CALCIUM 8.0*   < > 7.9* 7.7* 7.6*  PROT 5.5*  --   --   --   --  ALBUMIN 2.6*  --   --   --   --   AST 41  --   --   --   --   ALT 26  --   --   --   --   ALKPHOS 41  --   --   --   --   BILITOT 0.6  --   --   --   --   GFRNONAA 45*   < > 42* 41* 38*  GFRAA 53*   < > 48* 48* 44*  ANIONGAP 7   < > 9 9 9    < > = values in this interval not displayed.     Hematology Recent Labs  Lab 10/04/18 0434 10/05/18 0350 10/07/18 0351  WBC 8.2 7.3 13.3*  RBC 3.54* 3.65* 3.36*  HGB 10.4* 10.9* 9.8*  HCT 31.6* 33.1* 30.2*  MCV 89.3 90.7 89.9  MCH 29.4 29.9 29.2  MCHC 32.9 32.9 32.5  RDW 13.0 12.9 13.0  PLT 123* 165 187    Cardiac EnzymesNo results for input(s): TROPONINI in the last 168 hours. No results for input(s): TROPIPOC in the last 168 hours.   BNPNo results for input(s): BNP, PROBNP in the last 168 hours.   DDimer No results for input(s): DDIMER in the last 168 hours.   Radiology    No results found.  Cardiac Studies   LHC (10/06/18): Severe  disease of LAD and diagonal branch Patent proximal LAD stent with severe stenosis at the distal edge of the stent Remainder of the LAD is also small, diffusely diseased, diagonal branch diffusely diseased -RCA large vessel dominant with mild disease, left circumflex/OM with mild disease proximal region  Diagnostic  Dominance: Right     TEE (10/02/18): - Left ventricle: Systolic function was mildly reduced. The   estimated ejection fraction was in the range of 45% to 50%. - Aortic valve: No evidence of vegetation. There was trivial   regurgitation. - Mitral valve: There was mild regurgitation. - Left atrium: No evidence of thrombus in the atrial cavity or   appendage. - Right atrium: No evidence of thrombus in the atrial cavity or   appendage. - Atrial septum: Echo contrast study showed no right-to-left atrial   level shunt, at baseline or with provocation. - Pulmonic valve: No evidence of vegetation.  TEE (09/29/18): - Left ventricle: The cavity size was normal. Systolic function was   mildly reduced. The estimated ejection fraction was in the range   of 45% to 50%. The study is not technically sufficient to allow   evaluation of LV diastolic function. - Left atrium: The atrium was mildly dilated.  Patient Profile     63 y.o. female with history of multivessel CAD with CTO of the LAD which was treated with PCI at that time but the distal vessel was small in size and there was diffuse dissection that was treated with PTCA. She also has history of chronic systolic CHF s/p ICD CRT in 2015 with subsequent normalization of EF, osteomyelitis, DM2, HTN, obesity and OSA on CPAP who we are seeing for NSTEMI and MSSA bacteremia.  Assessment & Plan    NSTEMI catheterization showed severe lesion in the mid LAD at distal margin of previously placed stent.  Mid/distal LAD is relatively small and diffusely diseased. Also with severe diagonal disease, small vessel  patient without angina  during this hospitalization.  We will continue ASA and clopidogrel   PCI to LAD would be very challenging, likely of  little clinical benefit , also complicated in the setting of MSSA bacteremia   Continue rosuvastatin 40 mg daily. -Would proceed with toe amputation  Paroxysmal atrial tachycardia Unable to exclude atrial tachycardia versus atrial fibrillation/flutter Rates are in the 130 range on arrival and 2 days ago Improved with amiodarone, back to normal sinus rhythm Will transition to amiodarone 400 twice daily Would recommend we transition to metoprolol succinate 50 daily, perhaps after surgery  MSSA bacteremia Most likely due to left toe infection.  TEE did not show vegetation,  Given severe chills and fever last night, likely still high risk of seeding her ICD wires and device  amputation planned -We will need to follow closely following toe amputation, if continued fevers and chills, may need to remove ICD wire and device  Acute on chronic HFpEF LVEF mildly reduced by TTE and TEE.   -Would minimize IV fluids with toe amputation  Continue metoprolol and losartan. Change to metoprolol succinate  Thrombocytopenia Improving  Presumed secondary to sepsis   Total encounter time more than 25 minutes  Greater than 50% was spent in counseling and coordination of care with the patient   For questions or updates, please contact [place] Please consult [email or web address] for contact info under [facility]  Signed, Ida Rogue, MD  10/07/2018, 4:39 PM

## 2018-10-07 NOTE — Progress Notes (Signed)
[facility] Podiatry                                                      Patient Demographics  [name], is a 63 y.o. female   MRN: [id number]   DOB - [birth year]  Admit Date - 09/28/2018    Outpatient Primary MD for the patient is Rutherford Guys, MD  Consult requested in the Hospital by Salary, Avel Peace, MD, On 10/07/2018  With History of -  Past Medical History:  Diagnosis Date  . Acute myocardial infarction, subendocardial infarction, initial episode of care ([place]) 07/21/2012  . Acute osteomyelitis involving ankle and foot ([place]) 02/06/2015  . Acute systolic heart failure ([place]) 07/21/2012   New onset 07/19/12; admission to [facility] ED. Elevated Troponins.  S/p 2D-echo with EF 20-25%.  S/p cardiac catheterization with stenting LAD.  Repeat 2D-echo 10/2011 with improved EF of 35%.   . Anemia   . Automatic implantable cardioverter-defibrillator in situ    a. MDT CRT-D 06/2014, SN: [id number]    . CAD (coronary artery disease)    a. cardiac cath 101/04/2012: PCI/DES to chronically occluded mLAD, consideration PCI to diag branch in 4 weeks.   . Cataract   . Chicken pox   . Chronic systolic CHF (congestive heart failure) (HCC)    a. mixed ICM & NICM; b. EF 20-25% by echo 07/2012, mid-dist 2/3 of LV sev HK/AK, mild MR. echo 10/2012: EF 30-35%, sev HK ant-septal & inf walls, GR1DD, mild MR, PASP 33. c. echo 02/2013: EF 30%, GR1DD, mild MR. echo 04/2014: EF 30%, Septal-lat dyssynchrony, global HK, inf AK, GR1DD, mild MR. d. echo 10/2014: EF50-55%, WM nl, GR1DD, septal mild paradox. e. echo 02/2015: EF 50-55%, wm   . Depression   . Heart attack ([place])   . Heel ulcer ([place]) 04/27/2015  . History of blood transfusion ~ 2011   "plasma; had neuropathy; couldn't walk"  . Hypertension   . LBBB (left bundle branch block)   . Neuromuscular disorder ([place])   . Neuropathy 2011  .  Obesity, unspecified   . OSA on CPAP    Moderate with AHI 23/hr and now on CPAP at 16cm H2O.  Her DME is [facility]  . Pure hypercholesterolemia   . Type II diabetes mellitus ([place])   . Unspecified vitamin D deficiency       Past Surgical History:  Procedure Laterality Date  . AMPUTATION TOE Right 06/18/2015   Procedure: AMPUTATION TOE;  Surgeon: Samara Deist, DPM;  Location: [facility] ORS;  Service: Podiatry;  Laterality: Right;  . BACK SURGERY    . BI-VENTRICULAR IMPLANTABLE CARDIOVERTER DEFIBRILLATOR N/A 07/06/2014   Procedure: BI-VENTRICULAR IMPLANTABLE CARDIOVERTER DEFIBRILLATOR  (CRT-D);  Surgeon: Deboraha Sprang, MD;  [facility] CATH LAB;  Service: Cardiovascular;  Laterality: N/A;  . BI-VENTRICULAR IMPLANTABLE CARDIOVERTER DEFIBRILLATOR  (CRT-D)  07/06/2014  . BILATERAL OOPHORECTOMY  01/2011   ovarian cyst benign  . COLONOSCOPY WITH PROPOFOL Left 02/22/2015   Procedure: COLONOSCOPY WITH PROPOFOL;  Surgeon: Hulen Luster, MD;  Location: [facility] ENDOSCOPY;  Service: Endoscopy;  Laterality: Left;  . CORONARY ANGIOPLASTY WITH STENT PLACEMENT Left 07/2012   new onset systolic CHF; elevated troponins.  Cardiac catheterization with stenting to LAD; EF 15%.  2D-echo: EF 20-25%.  . ESOPHAGOGASTRODUODENOSCOPY N/A 02/22/2015   Procedure: ESOPHAGOGASTRODUODENOSCOPY (EGD);  Surgeon:  Hulen Luster, MD;  Location: [facility] ENDOSCOPY;  Service: Endoscopy;  Laterality: N/A;  . INCISION AND DRAINAGE ABSCESS Right 2007   groin; with ICU stay due to sepsis.  Marland Kitchen LAPAROSCOPIC CHOLECYSTECTOMY  2011  . LEFT HEART CATH AND CORONARY ANGIOGRAPHY N/A 10/05/2018   Procedure: LEFT HEART CATH AND CORONARY ANGIOGRAPHY;  Surgeon: Minna Merritts, MD;  Location: [place] CV LAB;  Service: Cardiovascular;  Laterality: N/A;  . LEFT HEART CATHETERIZATION WITH CORONARY ANGIOGRAM N/A 07/21/2012   Procedure: LEFT HEART CATHETERIZATION WITH CORONARY ANGIOGRAM;  Surgeon: Jolaine Artist, MD;  Location: [facility] CATH LAB;  Service: Cardiovascular;   Laterality: N/A;  . [place]   L4-5  . PERCUTANEOUS CORONARY STENT INTERVENTION (PCI-S) N/A 07/23/2012   Procedure: PERCUTANEOUS CORONARY STENT INTERVENTION (PCI-S);  Surgeon: Sherren Mocha, MD;  Location: [facility] CATH LAB;  Service: Cardiovascular;  Laterality: N/A;  . PERIPHERAL VASCULAR CATHETERIZATION N/A 02/10/2015   Procedure: Picc Line Insertion;  Surgeon: Katha Cabal, MD;  Location: [place] CV LAB;  Service: Cardiovascular;  Laterality: N/A;  . TEE WITHOUT CARDIOVERSION N/A 10/02/2018   Procedure: TRANSESOPHAGEAL ECHOCARDIOGRAM (TEE);  Surgeon: Wellington Hampshire, MD;  Location: [facility] ORS;  Service: Cardiovascular;  Laterality: N/A;  . [place]  . VAGINAL HYSTERECTOMY  01/2011   Fibroids/DUB.  Ovaries removed. [place].    in for   Chief Complaint  Patient presents with  . Code Sepsis     HPI  [name]  is a 63 y.o. female, patient hospitalized for multiple issues including sepsis.  Has osteomyelitis in the second toe of the left foot.  Had angioplasty yesterday to improve vascular flow to the right lower extremity with some success.  Scheduled for surgery tomorrow for digital amputation.    Review of Systems    In addition to the HPI above,  No Fever-chills, No Headache, No changes with Vision or hearing, No problems swallowing food or Liquids, No Chest pain, Cough or Shortness of Breath, No Abdominal pain, No Nausea or Vommitting, Bowel movements are regular, No Blood in stool or Urine, No dysuria, No new skin rashes or bruises, No new joints pains-aches,  No new weakness, tingling, numbness in any extremity, No recent weight gain or loss, No polyuria, polydypsia or polyphagia, No significant Mental Stressors.  A full 10 point Review of Systems was done, except as stated above, all other Review of Systems were negative.   Social History Social History   Tobacco Use  . Smoking status: Never Smoker  . Smokeless  tobacco: Never Used  Substance Use Topics  . Alcohol use: No    Alcohol/week: 0.0 standard drinks    Family History Family History  Problem Relation Age of Onset  . Diabetes Mother   . Hypertension Mother   . Arthritis Mother        knees, lumbar DDD, cervical DDD  . Cancer Father        prostate,skin,lymphoma.  . Cancer Brother 71       bladder cancer; non-smoker  . Diabetes Maternal Grandmother   . Heart disease Maternal Grandmother   . COPD Maternal Grandmother   . Diabetes Paternal Grandmother   . Obesity Brother   . Diabetes Son   . Hypertension Son   . Cancer Maternal Grandfather   . Diabetes Paternal Grandfather     Prior to Admission medications   Medication Sig Start Date End Date Taking? Authorizing Provider  AMBULATORY NON FORMULARY MEDICATION 1  Units by Other route once. Medication Name: foot brace 11/15/15  Yes Patel, Donika K, DO  Calcium Carb-Cholecalciferol (CALCIUM 600 + D PO) Take 1 tablet by mouth 2 (two) times daily.   Yes [provider]  citalopram (CELEXA) 20 MG tablet Take 1 tablet (20 mg total) by mouth daily. 04/15/18  Yes Wardell Honour, MD  clopidogrel (PLAVIX) 75 MG tablet Take 1 tablet (75 mg total) by mouth daily. Patient taking differently: Take 75 mg by mouth at bedtime.  04/15/18  Yes Wardell Honour, MD  ferrous sulfate 325 (65 FE) MG tablet Take 325 mg by mouth daily.   Yes [provider]  glimepiride (AMARYL) 4 MG tablet Take 1 tablet (4 mg total) by mouth 2 (two) times daily with a meal. 04/15/18  Yes Wardell Honour, MD  glucose blood (ONE TOUCH ULTRA TEST) test strip Check sugar three times daily dx: DMII uncontrolled insulin dependent with complications 2/50/53  Yes Wardell Honour, MD  Insulin Glargine (LANTUS) 100 UNIT/ML Solostar Pen Inject 30 Units into the skin at bedtime. 04/15/18  Yes Wardell Honour, MD  Insulin Pen Needle (B-D ULTRAFINE III SHORT PEN) 31G X 8 MM MISC USE AS DIRECTED WITH LANTUS; dx: DMII  uncontrolled, insulin dependent, with complications 9/76/73  Yes Wardell Honour, MD  losartan (COZAAR) 25 MG tablet Take 25 mg by mouth at bedtime.   Yes [provider]  omega-3 acid ethyl esters (LOVAZA) 1 g capsule Take 1 g by mouth 2 (two) times daily.   Yes [provider]  torsemide (DEMADEX) 20 MG tablet Take 1 tablet (20 mg total) by mouth daily. Patient taking differently: Take 20 mg by mouth at bedtime.  04/15/18  Yes Wardell Honour, MD  traMADol (ULTRAM) 50 MG tablet Take 1 tablet (50 mg total) by mouth every 6 (six) hours as needed. for pain Patient taking differently: Take 50 mg by mouth every 6 (six) hours as needed for moderate pain.  04/15/18  Yes Wardell Honour, MD  Dulaglutide (TRULICITY) 4.19 FX/9.0WI SOPN Inject 0.75 mg into the skin once a week. Patient not taking: Reported on 09/28/2018 04/15/18   Wardell Honour, MD  gabapentin (NEURONTIN) 600 MG tablet Take 1 tablet (600 mg total) by mouth 3 (three) times daily. Patient not taking: Reported on 09/28/2018 04/15/18   Wardell Honour, MD  rosuvastatin (CRESTOR) 40 MG tablet TAKE 1 TABLET BY MOUTH ONCE DAILY Patient not taking: Reported on 09/28/2018 12/16/17   Minna Merritts, MD    Anti-infectives (From admission, onward)   Start     Dose/Rate Route Frequency Ordered Stop   10/01/18 0900  ceFAZolin (ANCEF) IVPB 2g/100 mL premix     2 g 200 mL/hr over 30 Minutes Intravenous Every 8 hours 10/01/18 0813     09/30/18 2200  ceFAZolin (ANCEF) IVPB 2g/100 mL premix  Status:  Discontinued     2 g 200 mL/hr over 30 Minutes Intravenous Every 12 hours 09/30/18 1036 10/01/18 0813   09/29/18 2200  ceFAZolin (ANCEF) IVPB 1 g/50 mL premix  Status:  Discontinued     1 g 100 mL/hr over 30 Minutes Intravenous Every 12 hours 09/29/18 1505 09/30/18 1036   09/29/18 1000  ceFAZolin (ANCEF) IVPB 2g/100 mL premix  Status:  Discontinued     2 g 200 mL/hr over 30 Minutes Intravenous Every 8 hours 09/29/18 0921 09/29/18 1505    09/29/18 0000  ceFEPIme (MAXIPIME) 2 g in sodium chloride  0.9 % 100 mL IVPB  Status:  Discontinued     2 g 200 mL/hr over 30 Minutes Intravenous Every 12 hours 09/28/18 1318 09/29/18 0919   09/28/18 1830  vancomycin (VANCOCIN) 1,250 mg in sodium chloride 0.9 % 250 mL IVPB  Status:  Discontinued     1,250 mg 166.7 mL/hr over 90 Minutes Intravenous Every 18 hours 09/28/18 1318 09/29/18 1235   09/28/18 1200  ceFEPIme (MAXIPIME) 2 g in sodium chloride 0.9 % 100 mL IVPB     2 g 200 mL/hr over 30 Minutes Intravenous  Once 09/28/18 1156 09/28/18 1301   09/28/18 1200  metroNIDAZOLE (FLAGYL) IVPB 500 mg  Status:  Discontinued     500 mg 100 mL/hr over 60 Minutes Intravenous Every 8 hours 09/28/18 1156 09/29/18 0919   09/28/18 1200  vancomycin (VANCOCIN) IVPB 1000 mg/200 mL premix     1,000 mg 200 mL/hr over 60 Minutes Intravenous  Once 09/28/18 1156 09/28/18 1339      Scheduled Meds: . aspirin EC  81 mg Oral Daily  . chlorhexidine  15 mL Mouth Rinse BID  . clopidogrel  75 mg Oral Daily  . insulin aspart  0-20 Units Subcutaneous TID WC  . insulin aspart  0-5 Units Subcutaneous QHS  . insulin aspart  3 Units Subcutaneous TID WC  . insulin glargine  20 Units Subcutaneous Daily  . lidocaine  1 patch Transdermal Q24H  . losartan  25 mg Oral Daily  . mouth rinse  15 mL Mouth Rinse q12n4p  . metoprolol tartrate  25 mg Oral Q6H  . rosuvastatin  40 mg Oral q1800  . sodium chloride flush  3 mL Intravenous Q12H  . sodium chloride flush  3 mL Intravenous Q12H   Continuous Infusions: . sodium chloride Stopped (10/02/18 0322)  . sodium chloride 100 mL/hr at 10/07/18 0745  . sodium chloride 250 mL (10/07/18 0435)  . amiodarone 30 mg/hr (10/06/18 2201)  .  ceFAZolin (ANCEF) IV 2 g (10/07/18 0435)   PRN Meds:.sodium chloride, sodium chloride, acetaminophen **OR** acetaminophen, HYDROcodone-acetaminophen, morphine injection, [DISCONTINUED] ondansetron **OR** ondansetron (ZOFRAN) IV, oxyCODONE,  promethazine, sodium chloride flush, sodium chloride flush, traMADol  No Known Allergies  Physical Exam  Vitals  Blood pressure (!) 118/52, pulse 62, temperature 98.3 F (36.8 C), temperature source Oral, resp. rate 18, height 5\' 7"  (1.702 m), weight 132.9 kg, SpO2 96 %.  Lower Extremity exam:  Vascular: Improved since vascular reconstruction yesterday.  Dermatological: Ulcer dorsum second toe PIP joint.  Actually looks considerably better today there is no drainage and the bone is not protruding through the wound as it was but MRI showed likely osteo-to that region.  Neurological: Peripheral diabetic neuropathy  Ortho: Contracted toe with loss of the extensor tendon and bone prevalence in the wound over period of time.  MRI indicated that he could have of likely osteomyelitis to the proximal phalanx.  Data Review  CBC Recent Labs  Lab 10/02/18 0540 10/03/18 0418 10/04/18 0434 10/05/18 0350 10/07/18 0351  WBC 7.6 10.2 8.2 7.3 13.3*  HGB 10.7* 10.7* 10.4* 10.9* 9.8*  HCT 32.8* 32.8* 31.6* 33.1* 30.2*  PLT 63* 80* 123* 165 187  MCV 89.9 90.9 89.3 90.7 89.9  MCH 29.3 29.6 29.4 29.9 29.2  MCHC 32.6 32.6 32.9 32.9 32.5  RDW 13.2 13.1 13.0 12.9 13.0   ------------------------------------------------------------------------------------------------------------------  Chemistries  Recent Labs  Lab 10/01/18 0647  10/02/18 1513 10/03/18 0418 10/04/18 0434 10/05/18 0350 10/06/18 0433 10/07/18 0351  NA 136   < >  --  132* 135 134* 133* 131*  K 3.6   < >  --  3.6 3.6 4.1 3.6 3.7  CL 105   < >  --  101 103 101 101 99  CO2 24   < >  --  22 23 24 23 23   GLUCOSE 162*   < >  --  208* 190* 168* 197* 221*  BUN 47*   < >  --  31* 29* 27* 25* 24*  CREATININE 1.25*   < >  --  1.17* 1.20* 1.34* 1.35* 1.46*  CALCIUM 8.0*   < >  --  7.5* 7.7* 7.9* 7.7* 7.6*  MG  --   --  1.8  --   --   --   --   --   AST 41  --   --   --   --   --   --   --   ALT 26  --   --   --   --   --   --    --   ALKPHOS 41  --   --   --   --   --   --   --   BILITOT 0.6  --   --   --   --   --   --   --    < > = values in this interval not displayed.   ------------------------------------------------------------------------------------------------------------------ estimated creatinine clearance is 55.4 mL/min (A) (by C-G formula based on SCr of 1.46 mg/dL (H)). ------------------------------------------------------------------------------------------------------------------ No results for input(s): TSH, T4TOTAL, T3FREE, THYROIDAB in the last 72 hours.  Invalid input(s): FREET3 Urinalysis    Component Value Date/Time   COLORURINE YELLOW (A) 09/28/2018 1202   APPEARANCEUR CLOUDY (A) 09/28/2018 1202   LABSPEC 1.019 09/28/2018 1202   PHURINE 5.0 09/28/2018 1202   GLUCOSEU 150 (A) 09/28/2018 1202   HGBUR LARGE (A) 09/28/2018 1202   BILIRUBINUR NEGATIVE 09/28/2018 1202   BILIRUBINUR negative 09/27/2017 1506   BILIRUBINUR neg 01/16/2015 1533   KETONESUR 5 (A) 09/28/2018 1202   PROTEINUR 100 (A) 09/28/2018 1202   UROBILINOGEN 0.2 09/27/2017 1506   UROBILINOGEN 1.0 05/17/2010 0214   NITRITE NEGATIVE 09/28/2018 1202   LEUKOCYTESUR SMALL (A) 09/28/2018 1202     Imaging results:   No results found.  Assessment & Plan: I felt like the patient's blood flow was much better the toe looks more stable there is no overt cellulitis and she is responding to the antibiotics.  Explained to her that she has basically 3 options this point.  #1 leave alone and stay on antibiotics for prolonged period to see if that resolves it but there is an increased likelihood of rebound osteomyelitis and continued damage to the toe since she is lost flexor tendon the toe has contracture deformity at this point.  Other option be going and cleanout bone of the middle phalanx and the proximal phalanx and see if that would resolve the infection but once again she would run the risk of a slower healing time as well as  possible recurrence of all the bone is not resolved and removed.  Third option is to amputate the toe at the MTP joint level which would resolve the infection resolve the deformity and give her the best chance of not having further problems with it.  This is what she wants to do.  Will go over that with her today explained risk Possible complications of surgery she understands the risk.  Will have a  sent form signed by her keep her n.p.o. tonight and surgery scheduled for tomorrow morning at 730.  Active Problems:   Sepsis [facility])   Family Communication: Plan discussed with patient  Albertine Patricia M.D on 10/07/2018 at 10:02 AM  Thank you for the consult, we will follow the patient with you in the Hospital.

## 2018-10-07 NOTE — Progress Notes (Signed)
[place] [facility] [place] NAME: Sela Falk    MR#:  [id number]  DATE OF BIRTH:  1956-09-03  SUBJECTIVE:  Patient without complaint, no events overnight, Plavix on hold for amputation by podiatry  REVIEW OF SYSTEMS:  Review of Systems  Constitutional: Negative for chills, fever and malaise/fatigue.  HENT: Negative for congestion, ear discharge, hearing loss and nosebleeds.   Eyes: Negative for blurred vision and double vision.  Respiratory: Negative for cough and shortness of breath.   Cardiovascular: Negative for chest pain, palpitations and leg swelling.  Gastrointestinal: Negative for abdominal pain, heartburn, nausea and vomiting.  Genitourinary: Negative for dysuria and urgency.  Musculoskeletal: Positive for back pain and myalgias.  Neurological: Negative for dizziness, focal weakness, weakness and headaches.  Psychiatric/Behavioral: Negative for depression.    DRUG ALLERGIES:  No Known Allergies  VITALS:  Blood pressure (!) 141/65, pulse (!) 59, temperature 98 F (36.7 C), temperature source Oral, resp. rate 19, height 5\' 7"  (1.702 m), weight 132.9 kg, SpO2 100 %.  PHYSICAL EXAMINATION:  Physical Exam   GENERAL:  63 y.o.-year-old obese patient lying in the bed with no acute distress.  EYES: Pupils equal, round, reactive to light and accommodation. No scleral icterus. Extraocular muscles intact.  HEENT: Head atraumatic, normocephalic. Oropharynx and nasopharynx clear.  NECK:  Supple, no jugular venous distention. No thyroid enlargement, no tenderness.  LUNGS: Normal breath sounds bilaterally, no wheezing, rales,rhonchi or crepitation. No use of accessory muscles of respiration. Decreased bibasilar breath sounds CARDIOVASCULAR: S1, S2 normal. No  rubs, or gallops. 2/6 systolic murmur present ICD on left chest ABDOMEN: Soft, nontender, nondistended. Bowel sounds present. No organomegaly or mass.  EXTREMITIES: No pedal edema,  cyanosis, or clubbing.  Left foot second toe swelling, decreased sensation but improved color -Right groin cardiac cath site with no active bleeding NEUROLOGIC: Cranial nerves II through XII are intact. Muscle strength 5/5 in all extremities. Sensation intact. Gait not checked. Global weakness noted. PSYCHIATRIC: The patient is alert and oriented x 3.  SKIN: No obvious rash, lesion, or ulcer.    LABORATORY PANEL:   CBC Recent Labs  Lab 10/07/18 0351  WBC 13.3*  HGB 9.8*  HCT 30.2*  PLT 187   ------------------------------------------------------------------------------------------------------------------  Chemistries  Recent Labs  Lab 10/01/18 0647  10/02/18 1513  10/07/18 0351  NA 136   < >  --    < > 131*  K 3.6   < >  --    < > 3.7  CL 105   < >  --    < > 99  CO2 24   < >  --    < > 23  GLUCOSE 162*   < >  --    < > 221*  BUN 47*   < >  --    < > 24*  CREATININE 1.25*   < >  --    < > 1.46*  CALCIUM 8.0*   < >  --    < > 7.6*  MG  --   --  1.8  --   --   AST 41  --   --   --   --   ALT 26  --   --   --   --   ALKPHOS 41  --   --   --   --   BILITOT 0.6  --   --   --   --    < > =  values in this interval not displayed.   ------------------------------------------------------------------------------------------------------------------  Cardiac Enzymes No results for input(s): TROPONINI in the last 168 hours. ------------------------------------------------------------------------------------------------------------------  RADIOLOGY:  No results found.  EKG:   Orders placed or performed during the hospital encounter of 09/28/18  . ED EKG 12-Lead  . ED EKG 12-Lead  . EKG 12-Lead  . EKG 12-Lead  . EKG 12-Lead  . EKG 12-Lead  . EKG 12-Lead  . EKG 12-Lead  . EKG 12-Lead  . EKG 12-Lead  . EKG 12-Lead  . EKG 12-Lead  . EKG 12-Lead  . EKG 12-Lead  . EKG 12-Lead  . EKG 12-Lead  . EKG 12-Lead  . EKG 12-Lead  . EKG 12-Lead  . EKG 12-Lead  . EKG 12-Lead    . EKG 12-Lead  . EKG 12-Lead  . EKG 12-Lead    ASSESSMENT AND PLAN:   63 year old female with past medical history significant for CAD status post PCI, chronic systolic CHF status post ICD, ischemic cardiomyopathy, diabetes, hypertension, obesity, sleep apnea on CPAP admitted for MSSA bacteremia and left foot second toe ulceration.  *Acute MSSA bacteremia Secondary to toe infection Resolving  Continue IV Ancef Repeat blood cultures from 09/29/2018 are negative so far, infectious disease input greatly appreciated,  TEE done on 10/02/2018-negative for vegetations/ICD wires do not look infected, awaiting toe amputation by podiatry/Dr. Elvina Mattes, status post left lower extremity angiogram with stent placement/angioplasty performed  *Acute NSTEMI Hx chronic LAD occlusive disease and stent in 2013 Medical management per cardiology, DAPT, statin therapy, losartan cardiac catheterization on 09/18/2018 that shown chronically occluded LAD which is a narrow vessel Chronic LAD occlusion further can be addressed as outpatient  *Acute left foot second toe infection Secondary to MSSA Podiatry following as stated above Amputation needed due to presence of ICD, possible Thursday or Friday by podiatry  *Acute renal failure on CKD Continue to avoid nephrotoxic agents  *Chronic systolic CHF-euvolemi without exacerbation Continue losartan, aspirin, Coreg, and statin.  Entresto as outpatient if blood pressure permits  *Chronic diabetes mellitus Stable on current regiment  *Tachycardia s/p pacemaker placement Continue amiodarone, cardiology input appreciated   DVT prophylaxis-teds and SCDs had to stop the heparin due to drop in platelets.  Heparin-induced antibody assays pending.  Level of antibodies is within normal limits though, less likely to be HITT, could have been sepsis /acute illness induced thrombocytopenia  Physical therapy consulted    All the records are reviewed and case  discussed with Care Management/Social Workerr. Management plans discussed with the patient, family and they are in agreement.  CODE STATUS: Full Code  TOTAL TIME TAKING CARE OF THIS PATIENT: 35 minutes.   POSSIBLE D/C IN 3-6 DAYS, DEPENDING ON CLINICAL CONDITION.   Avel Peace Salary M.D on 10/07/2018 at 2:29 PM  Between 7am to 6pm - Pager - [phone]  After 6pm go to [email or web address] - password [email or web address] [facility]  Office  [phone]  CC: Primary care physician; Rutherford Guys, MD

## 2018-10-08 ENCOUNTER — Encounter
Admission: EM | Disposition: A | Discharge status: Home Health | Payer: Self-pay | Source: Home / Self Care | Attending: Internal Medicine

## 2018-10-08 ENCOUNTER — Inpatient Hospital Stay: Payer: PPO | Admitting: Anesthesiology

## 2018-10-08 ENCOUNTER — Encounter: Payer: Self-pay | Admitting: Podiatry

## 2018-10-08 ENCOUNTER — Inpatient Hospital Stay: Payer: Self-pay

## 2018-10-08 DIAGNOSIS — R4182 Altered mental status, unspecified: Secondary | ICD-10-CM

## 2018-10-08 HISTORY — PX: AMPUTATION TOE: SHX6595

## 2018-10-08 LAB — GLUCOSE, CAPILLARY
Glucose-Capillary: 170 mg/dL — ABNORMAL HIGH (ref 70–99)
Glucose-Capillary: 296 mg/dL — ABNORMAL HIGH (ref 70–99)
Glucose-Capillary: 204 mg/dL — ABNORMAL HIGH (ref 70–99)
Glucose-Capillary: 156 mg/dL — ABNORMAL HIGH (ref 70–99)

## 2018-10-08 SURGERY — AMPUTATION, TOE
Anesthesia: General | Site: Foot | Laterality: Left

## 2018-10-08 MED ORDER — VITAMIN C 500 MG PO TABS
250.0000 mg | ORAL_TABLET | Freq: Two times a day (BID) | ORAL | Status: DC
  Administered 2018-10-08 – 2018-10-09 (×2): 250 mg via ORAL
  Filled 2018-10-08 (×3): qty 1

## 2018-10-08 MED ORDER — OCUVITE-LUTEIN PO CAPS
1.0000 | ORAL_CAPSULE | Freq: Every day | ORAL | Status: DC
  Administered 2018-10-08 – 2018-10-09 (×2): 1 via ORAL
  Filled 2018-10-08 (×2): qty 1

## 2018-10-08 MED ORDER — PREMIER PROTEIN SHAKE
11.0000 [oz_av] | Freq: Two times a day (BID) | ORAL | Status: DC
  Administered 2018-10-08 – 2018-10-09 (×2): 11 [oz_av] via ORAL

## 2018-10-08 MED ORDER — PROPOFOL 500 MG/50ML IV EMUL
INTRAVENOUS | Status: DC | PRN
  Administered 2018-10-08: 25 ug/kg/min via INTRAVENOUS

## 2018-10-08 MED ORDER — CLOPIDOGREL BISULFATE 75 MG PO TABS
75.0000 mg | ORAL_TABLET | Freq: Every day | ORAL | Status: DC
  Administered 2018-10-09: 75 mg via ORAL
  Filled 2018-10-08: qty 1

## 2018-10-08 MED ORDER — PROPOFOL 500 MG/50ML IV EMUL
INTRAVENOUS | Status: AC
  Filled 2018-10-08: qty 50

## 2018-10-08 MED ORDER — FENTANYL CITRATE (PF) 100 MCG/2ML IJ SOLN
INTRAMUSCULAR | Status: AC
  Filled 2018-10-08: qty 2

## 2018-10-08 MED ORDER — FENTANYL CITRATE (PF) 100 MCG/2ML IJ SOLN: 25.0000 ug | INTRAMUSCULAR | Status: DC | PRN

## 2018-10-08 MED ORDER — SODIUM CHLORIDE 0.9% FLUSH
10.0000 mL | INTRAVENOUS | Status: DC | PRN
  Administered 2018-10-09: 10 mL
  Filled 2018-10-08: qty 40

## 2018-10-08 MED ORDER — MIDAZOLAM HCL 2 MG/2ML IJ SOLN
INTRAMUSCULAR | Status: AC
  Filled 2018-10-08: qty 2

## 2018-10-08 MED ORDER — MEPERIDINE HCL 50 MG/ML IJ SOLN: 6.2500 mg | INTRAMUSCULAR | Status: DC | PRN

## 2018-10-08 MED ORDER — BUPIVACAINE HCL 0.5 % IJ SOLN
INTRAMUSCULAR | Status: DC | PRN
  Administered 2018-10-08: 10 mL

## 2018-10-08 MED ORDER — OXYCODONE HCL 5 MG/5ML PO SOLN: 5.0000 mg | Freq: Once | ORAL | Status: DC | PRN

## 2018-10-08 MED ORDER — OXYCODONE HCL 5 MG PO TABS: 5.0000 mg | ORAL_TABLET | Freq: Once | ORAL | Status: DC | PRN

## 2018-10-08 MED ORDER — FENTANYL CITRATE (PF) 100 MCG/2ML IJ SOLN
INTRAMUSCULAR | Status: DC | PRN
  Administered 2018-10-08 (×2): 50 ug via INTRAVENOUS

## 2018-10-08 MED ORDER — PROMETHAZINE HCL 25 MG/ML IJ SOLN: 6.2500 mg | INTRAMUSCULAR | Status: DC | PRN

## 2018-10-08 MED ORDER — GELATIN ABSORBABLE 100 CM EX MISC
CUTANEOUS | Status: AC
  Filled 2018-10-08: qty 1

## 2018-10-08 MED ORDER — SODIUM CHLORIDE 0.9% FLUSH
10.0000 mL | Freq: Two times a day (BID) | INTRAVENOUS | Status: DC
  Administered 2018-10-08 – 2018-10-09 (×2): 10 mL

## 2018-10-08 MED ORDER — MIDAZOLAM HCL 5 MG/5ML IJ SOLN
INTRAMUSCULAR | Status: DC | PRN
  Administered 2018-10-08 (×2): 1 mg via INTRAVENOUS

## 2018-10-08 SURGICAL SUPPLY — 44 items
"PENCIL ELECTRO HAND CTR " (MISCELLANEOUS) ×1 IMPLANT
BANDAGE ELASTIC 4 LF NS (GAUZE/BANDAGES/DRESSINGS) ×2 IMPLANT
BLADE MED AGGRESSIVE (BLADE) ×2 IMPLANT
BLADE SURG 15 STRL LF DISP TIS (BLADE) ×4 IMPLANT
BLADE SURG 15 STRL SS (BLADE) ×8
BNDG CMPR 75X41 PLY HI ABS (GAUZE/BANDAGES/DRESSINGS) ×1
BNDG CMPR MED 5X4 ELC HKLP NS (GAUZE/BANDAGES/DRESSINGS) ×1
BNDG CONFORM 3 STRL LF (GAUZE/BANDAGES/DRESSINGS) ×2 IMPLANT
BNDG ESMARK 4X12 TAN STRL LF (GAUZE/BANDAGES/DRESSINGS) ×2 IMPLANT
BNDG GAUZE 4.5X4.1 6PLY STRL (MISCELLANEOUS) ×2 IMPLANT
BNDG STRETCH 4X75 STRL LF (GAUZE/BANDAGES/DRESSINGS) ×1 IMPLANT
CANISTER SUCT 1200ML W/VALVE (MISCELLANEOUS) ×2 IMPLANT
COVER WAND RF STERILE (DRAPES) ×2 IMPLANT
CUFF TOURN 18 STER (MISCELLANEOUS) ×2 IMPLANT
CUFF TOURN DUAL PL 12 NO SLV (MISCELLANEOUS) ×1 IMPLANT
DRAPE FLUOR MINI C-ARM 54X84 (DRAPES) ×1 IMPLANT
DURAPREP 26ML APPLICATOR (WOUND CARE) ×2 IMPLANT
ELECT REM PT RETURN 9FT ADLT (ELECTROSURGICAL) ×2
ELECTRODE REM PT RTRN 9FT ADLT (ELECTROSURGICAL) ×1 IMPLANT
GAUZE PETRO XEROFOAM 1X8 (MISCELLANEOUS) ×2 IMPLANT
GAUZE SPONGE 4X4 12PLY STRL (GAUZE/BANDAGES/DRESSINGS) ×2 IMPLANT
GAUZE XEROFORM 4X4 STRL (GAUZE/BANDAGES/DRESSINGS) ×1 IMPLANT
GLOVE BIO SURGEON STRL SZ8 (GLOVE) ×2 IMPLANT
GLOVE INDICATOR 8.0 STRL GRN (GLOVE) ×2 IMPLANT
GOWN STRL REUS W/ TWL LRG LVL3 (GOWN DISPOSABLE) ×2 IMPLANT
GOWN STRL REUS W/TWL LRG LVL3 (GOWN DISPOSABLE) ×4
HANDLE YANKAUER SUCT BULB TIP (MISCELLANEOUS) ×1 IMPLANT
KIT TURNOVER KIT A (KITS) ×2 IMPLANT
LABEL OR SOLS (LABEL) ×2 IMPLANT
NDL HYPO 25X1 1.5 SAFETY (NEEDLE) ×3 IMPLANT
NDL SAFETY ECLIPSE 18X1.5 (NEEDLE) ×1 IMPLANT
NEEDLE HYPO 18GX1.5 SHARP (NEEDLE) ×2
NEEDLE HYPO 25X1 1.5 SAFETY (NEEDLE) ×6 IMPLANT
NS IRRIG 500ML POUR BTL (IV SOLUTION) ×2 IMPLANT
PACK EXTREMITY ARMC (MISCELLANEOUS) ×2 IMPLANT
PENCIL ELECTRO HAND CTR (MISCELLANEOUS) ×2 IMPLANT
RASP SM TEAR CROSS CUT (RASP) ×2 IMPLANT
SPONGE SURGIFOAM ABS GEL 100 (HEMOSTASIS) ×1 IMPLANT
STOCKINETTE STRL 6IN 960660 (GAUZE/BANDAGES/DRESSINGS) ×2 IMPLANT
SUT ETH BLK MONO 3 0 FS 1 12/B (SUTURE) ×2 IMPLANT
SUT ETHILON 5 0 PS 2 18 (SUTURE) ×2 IMPLANT
SUT VIC AB 4-0 FS2 27 (SUTURE) ×2 IMPLANT
SWAB CULTURE AMIES ANAERIB BLU (MISCELLANEOUS) ×2 IMPLANT
SYR 10ML LL (SYRINGE) ×2 IMPLANT

## 2018-10-08 NOTE — Anesthesia Preprocedure Evaluation (Signed)
Anesthesia Evaluation  Patient identified by MRN, date of birth, ID band Patient awake    Reviewed: Allergy & Precautions, NPO status , Patient's Chart, lab work & pertinent test results  History of Anesthesia Complications Negative for: history of anesthetic complications  Airway Mallampati: IV  TM Distance: >3 FB Neck ROM: Full    Dental no notable dental hx.    Pulmonary sleep apnea (has CPAP but does not wear it) , neg COPD,           Cardiovascular hypertension, Pt. on medications + CAD, + Past MI, + Cardiac Stents and +CHF  + dysrhythmias Atrial Fibrillation + Cardiac Defibrillator   L heart cath 10/05/18:  Prox Cx to Dist Cx lesion is 80% stenosed.  Prox LAD-1 lesion is 40% stenosed.  Prox LAD-2 lesion is 90% stenosed.  Prox LAD to Mid LAD lesion is 80% stenosed.  Mid LAD to Dist LAD lesion is 70% stenosed.  1st Diag lesion is 90% stenosed.  Ost 1st Diag lesion is 80% stenosed.  Mid RCA-1 lesion is 40% stenosed.  Mid RCA-2 lesion is 40% stenosed.   Echo 10/02/18: - Left ventricle: Systolic function was mildly reduced. The   estimated ejection fraction was in the range of 45% to 50%. - Aortic valve: No evidence of vegetation. There was trivial   regurgitation. - Mitral valve: There was mild regurgitation. - Left atrium: No evidence of thrombus in the atrial cavity or   appendage. - Right atrium: No evidence of thrombus in the atrial cavity or   appendage. - Atrial septum: Echo contrast study showed no right-to-left atrial   level shunt, at baseline or with provocation. - Pulmonic valve: No evidence of vegetation.   Neuro/Psych neg Seizures PSYCHIATRIC DISORDERS Depression negative neurological ROS     GI/Hepatic negative GI ROS, Neg liver ROS,   Endo/Other  diabetes, Insulin Dependent  Renal/GU Renal InsufficiencyRenal disease     Musculoskeletal negative musculoskeletal ROS (+)    Abdominal (+) + obese,   Peds  Hematology  (+) anemia ,   Anesthesia Other Findings Past Medical History: 07/21/2012: Acute myocardial infarction, subendocardial infarction,  initial episode of care ([place]) 02/06/2015: Acute osteomyelitis involving ankle and foot ([place]) 28/78/6767: Acute systolic heart failure ([place])     Comment:  New onset 07/19/12; admission to [facility] ED. Elevated              Troponins.  S/p 2D-echo with EF 20-25%.  S/p cardiac               catheterization with stenting LAD.  Repeat 2D-echo 10/2011              with improved EF of 35%.  No date: Anemia No date: Automatic implantable cardioverter-defibrillator in situ     Comment:  a. MDT CRT-D 06/2014, SN: [id number]   No date: CAD (coronary artery disease)     Comment:  a. cardiac cath 101/04/2012: PCI/DES to chronically               occluded mLAD, consideration PCI to diag branch in 4               weeks.  No date: Cataract No date: Chicken pox No date: Chronic systolic CHF (congestive heart failure) ([place])     Comment:  a. mixed ICM & NICM; b. EF 20-25% by echo 07/2012,               mid-dist 2/3 of LV sev HK/AK,  mild MR. echo 10/2012: EF               30-35%, sev HK ant-septal & inf walls, GR1DD, mild MR,               PASP 33. c. echo 02/2013: EF 30%, GR1DD, mild MR. echo               04/2014: EF 30%, Septal-lat dyssynchrony, global HK, inf               AK, GR1DD, mild MR. d. echo 10/2014: EF50-55%, WM nl,               GR1DD, septal mild paradox. e. echo 02/2015: EF 50-55%, wm No date: Depression No date: Heart attack ([place]) 04/27/2015: Heel ulcer ([place]) ~ 2011: History of blood transfusion     Comment:  "plasma; had neuropathy; couldn't walk" No date: Hypertension No date: LBBB (left bundle branch block) No date: Neuromuscular disorder ([place]) 2011: Neuropathy No date: Obesity, unspecified No date: OSA on CPAP     Comment:  Moderate with AHI 23/hr and now on CPAP at 16cm H2O.                Her DME is  [facility] No date: Pure hypercholesterolemia No date: Type II diabetes mellitus ([place]) No date: Unspecified vitamin D deficiency   Reproductive/Obstetrics                             Anesthesia Physical Anesthesia Plan  ASA: III  Anesthesia Plan: General   Post-op Pain Management:    Induction: Intravenous  PONV Risk Score and Plan: 2 and Propofol infusion  Airway Management Planned: Natural Airway  Additional Equipment:   Intra-op Plan:   Post-operative Plan:   Informed Consent: I have reviewed the patients History and Physical, chart, labs and discussed the procedure including the risks, benefits and alternatives for the proposed anesthesia with the patient or authorized representative who has indicated his/her understanding and acceptance.   Dental advisory given  Plan Discussed with: CRNA and Anesthesiologist  Anesthesia Plan Comments:         Anesthesia Quick Evaluation

## 2018-10-08 NOTE — Anesthesia Post-op Follow-up Note (Signed)
Anesthesia QCDR form completed.        

## 2018-10-08 NOTE — Progress Notes (Signed)
Pharmacy Antibiotic Note  [name] is a 62 y.o. female admitted on 09/28/2018 with MSSA bacteremia. Patient with past medical history significant for diabetes mellitus, coronary artery disease, cardiomyopathy, and AICD. Source of infection is likely second toe on left foot.  Pharmacy has been consulted for cefazolin dosing. TEE was negative for vegetation. Planning toe amputation, possibly today or tomorrow. Considering ICD removal due to high likelihood of seeding despite normal TEE (S/S: fever, chills). ID is recommending 6 weeks of antibiotic therapy.  Plan: Continue cefazolin to 2 g IV q8h  Height: 5\' 7"  (170.2 cm) Weight: 293 lb 1.6 oz (132.9 kg) IBW/kg (Calculated) : 61.6  Temp (24hrs), Avg:98.4 F (36.9 C), Min:98 F (36.7 C), Max:98.6 F (37 C)  Recent Labs  Lab 10/02/18 0540 10/03/18 0418 10/04/18 0434 10/05/18 0350 10/06/18 0433 10/07/18 0351  WBC 7.6 10.2 8.2 7.3  --  13.3*  CREATININE 1.24* 1.17* 1.20* 1.34* 1.35* 1.46*    Estimated Creatinine Clearance: 55.4 mL/min (A) (by C-G formula based on SCr of 1.46 mg/dL (H)).    No Known Allergies  Antimicrobials this admission: Cefepime 12/23 >> 12/24 Metronidazole 12/23 >> 12/24  Vancomycin 12/23 >> 12/24  Cefazolin 12/24 >>   Dose adjustments this admission: Increased cefazolin to 2 g IV q12h from 1 g IV q12h Increased cefazolin to 2g IV q8h due to renal improvement  Microbiology results: 12/30 Wound cx moderate s. Aureus; pansensitive 12/23 BCx: MSSA 12/24 BCx: NGTD 12/24 Cdiff (-) 12/23 UCx: Ecoli - pansensitive 12/23 MRSA PCR: negative   Thank you for allowing pharmacy to be a part of this patient's care.  Paticia Stack, PharmD Pharmacy Resident  10/08/2018 7:35 AM

## 2018-10-08 NOTE — Progress Notes (Signed)
[employer] - [place] at [facility]   PATIENT NAME: [name]    MR#:  [id number]  DATE OF BIRTH:  [birth year]  SUBJECTIVE:  Patient without complaint, no events overnight, Plavix on hold for amputation by podiatry  REVIEW OF SYSTEMS:  Review of Systems  Constitutional: Negative for chills, fever and malaise/fatigue.  HENT: Negative for congestion, ear discharge, hearing loss and nosebleeds.   Eyes: Negative for blurred vision and double vision.  Respiratory: Negative for cough and shortness of breath.   Cardiovascular: Negative for chest pain, palpitations and leg swelling.  Gastrointestinal: Negative for abdominal pain, heartburn, nausea and vomiting.  Genitourinary: Negative for dysuria and urgency.  Musculoskeletal: Positive for back pain and myalgias.  Neurological: Negative for dizziness, focal weakness, weakness and headaches.  Psychiatric/Behavioral: Negative for depression.    DRUG ALLERGIES:  No Known Allergies  VITALS:  Blood pressure (!) 148/55, pulse 60, temperature 98.5 F (36.9 C), resp. rate 16, height 5\' 7"  (1.702 m), weight 132.9 kg, SpO2 95 %.  PHYSICAL EXAMINATION:  Physical Exam   GENERAL:  63 y.o.-year-old obese patient lying in the bed with no acute distress.  EYES: Pupils equal, round, reactive to light and accommodation. No scleral icterus. Extraocular muscles intact.  HEENT: Head atraumatic, normocephalic. Oropharynx and nasopharynx clear.  NECK:  Supple, no jugular venous distention. No thyroid enlargement, no tenderness.  LUNGS: Normal breath sounds bilaterally, no wheezing, rales,rhonchi or crepitation. No use of accessory muscles of respiration. Decreased bibasilar breath sounds CARDIOVASCULAR: S1, S2 normal. No  rubs, or gallops. 2/6 systolic murmur present ICD on left chest ABDOMEN: Soft, nontender, nondistended. Bowel sounds present. No organomegaly or mass.  EXTREMITIES: No pedal edema, cyanosis, or clubbing.  Left  foot second toe swelling, decreased sensation but improved color -Right groin cardiac cath site with no active bleeding NEUROLOGIC: Cranial nerves II through XII are intact. Muscle strength 5/5 in all extremities. Sensation intact. Gait not checked. Global weakness noted. PSYCHIATRIC: The patient is alert and oriented x 3.  SKIN: No obvious rash, lesion, or ulcer.    LABORATORY PANEL:   CBC Recent Labs  Lab 10/07/18 0351  WBC 13.3*  HGB 9.8*  HCT 30.2*  PLT 187   ------------------------------------------------------------------------------------------------------------------  Chemistries  Recent Labs  Lab 10/02/18 1513  10/07/18 0351  NA  --    < > 131*  K  --    < > 3.7  CL  --    < > 99  CO2  --    < > 23  GLUCOSE  --    < > 221*  BUN  --    < > 24*  CREATININE  --    < > 1.46*  CALCIUM  --    < > 7.6*  MG 1.8  --   --    < > = values in this interval not displayed.   ------------------------------------------------------------------------------------------------------------------  Cardiac Enzymes No results for input(s): TROPONINI in the last 168 hours. ------------------------------------------------------------------------------------------------------------------  RADIOLOGY:  [place] Ekg Site Rite  Result Date: 10/08/2018 If Site Rite image not attached, placement could not be confirmed due to current cardiac rhythm.   EKG:   Orders placed or performed during the hospital encounter of 09/28/18  . ED EKG 12-Lead  . ED EKG 12-Lead  . EKG 12-Lead  . EKG 12-Lead  . EKG 12-Lead  . EKG 12-Lead  . EKG 12-Lead  . EKG 12-Lead  . EKG 12-Lead  . EKG 12-Lead  . EKG  12-Lead  . EKG 12-Lead  . EKG 12-Lead  . EKG 12-Lead  . EKG 12-Lead  . EKG 12-Lead  . EKG 12-Lead  . EKG 12-Lead  . EKG 12-Lead  . EKG 12-Lead  . EKG 12-Lead  . EKG 12-Lead  . EKG 12-Lead  . EKG 12-Lead    ASSESSMENT AND PLAN:  63 year old female with past medical history significant for  CAD status post PCI, chronic systolic CHF status post ICD, ischemic cardiomyopathy, diabetes, hypertension, obesity, sleep apnea on CPAP admitted for MSSA bacteremia and left foot second toe ulceration.  *Acute MSSA bacteremia Secondary to toe infection Resolving  Continue IV Ancef-6 weeks of treatment per infectious disease, for PICC line placement later today Repeat blood cultures from 09/29/2018 are negative so far, infectious disease input greatly appreciated,  TEE done on 10/02/2018-negative for vegetations/ICD wires do not look infected, s/p left second MTP this morning by Dr. Faythe Ghee, s/p left LE angiogram with stent placement/angioplasty performed by vascular surgery/Dr. Gilda Crease  *Acute NSTEMI Hx chronic LAD occlusive disease and stent in 2013 Medical management per cardiology, Plavix held temporarily for toe amputation, continue DAPT, statin therapy, losartan cardiac catheterization on 09/18/2018 that shown chronically occluded LAD which is a narrow vessel Chronic LAD occlusion further can be addressed as outpatient per cardiology  *Acute left foot second toe infection Secondary to MSSA Status post second left MTP by podiatry/Dr. Orland Jarred October 08, 2018  Amputation needed due to presence of ICD  *Acute renal failure on CKD Continue to avoid nephrotoxic agents  *Chronic systolic CHF-euvolemi without exacerbation Continue losartan, aspirin, Coreg, and statin.  Entresto as outpatient if blood pressure permits  *Chronic diabetes mellitus Stable on current regiment  *Tachycardia Resolved s/p pacemaker placement Continue amiodarone, cardiology input appreciated   DVT prophylaxis-teds and SCDs had to stop the heparin due to drop in platelets.  Heparin-induced antibody assays pending.  Level of antibodies is within normal limits though, less likely to be HITT, could have been sepsis /acute illness induced thrombocytopenia  Physical therapy consulted    All the  records are reviewed and case discussed with Care Management/Social Workerr. Management plans discussed with the patient, family and they are in agreement.  CODE STATUS: Full Code  TOTAL TIME TAKING CARE OF THIS PATIENT: 35 minutes.   POSSIBLE D/C IN 3-6 DAYS, DEPENDING ON CLINICAL CONDITION.   Evelena Asa Lashanda Storlie M.D on 10/08/2018 at 1:05 PM  Between 7am to 6pm - Pager - [phone]  After 6pm go to [email or web address] - password [email or web address]  [facility] [place]  Office  [phone]  CC: Primary care physician; Myles Lipps, MD

## 2018-10-08 NOTE — Progress Notes (Signed)
Initial Nutrition Assessment  DOCUMENTATION CODES:   Morbid obesity  INTERVENTION:   Premier Protein BID, each supplement provides 160 kcal and 30 grams of protein.   Ocuvite daily for wound healing (provides zinc, vitamin A, vitamin C, Vitamin E, copper, and selenium)  Vitamin C 247m po BID  NUTRITION DIAGNOSIS:   Increased nutrient needs related to wound healing as evidenced by increased estimated needs.  GOAL:   Patient will meet greater than or equal to 90% of their needs  MONITOR:   PO intake, Supplement acceptance, Labs, Weight trends, Skin, I & O's  REASON FOR ASSESSMENT:   LOS    ASSESSMENT:   63year old female with past medical history significant for CAD status post PCI, chronic systolic CHF status post ICD, ischemic cardiomyopathy, diabetes, hypertension, obesity, sleep apnea on CPAP admitted for MSSA bacteremia and left foot second toe ulceration.   Pt s/p amputation of MTP joint L foot today  Met with pt in room today. Pt reports good appetite and oral intake pta and today. Pt reports eating 100% of meals in hospital. Per chart, pt is weight stable pta. RD discussed with pt the importance of adequate nutrition to support wound healing. Pt would like to have chocolate Premier Protein as she drinks this at home. RD will also order vitamins to support wound healing.   Medications reviewed and include: aspirin, plavix, insulin, cefazolin   Labs reviewed: Na 131(L), BUN 24(H), creat 1.46(H) Wbc- 13.3(H), Hgb 9.8(L), Hct 30.2(L) cbgs- 170, 296 x 24 hrs  NUTRITION - FOCUSED PHYSICAL EXAM:    Most Recent Value  Orbital Region  No depletion  Upper Arm Region  No depletion  Thoracic and Lumbar Region  No depletion  Buccal Region  No depletion  Temple Region  No depletion  Clavicle Bone Region  No depletion  Clavicle and Acromion Bone Region  No depletion  Scapular Bone Region  No depletion  Dorsal Hand  No depletion  Patellar Region  No depletion   Anterior Thigh Region  No depletion  Posterior Calf Region  No depletion  Edema (RD Assessment)  Mild  Hair  Reviewed  Eyes  Reviewed  Mouth  Reviewed  Skin  Reviewed [ecchymosis ]  Nails  Reviewed     Diet Order:   Diet Order            Diet Carb Modified Fluid consistency: Thin; Room service appropriate? Yes  Diet effective now             EDUCATION NEEDS:   Education needs have been addressed  Skin:  Skin Assessment: Reviewed RN Assessment(ecchymosis, incision L foot )  Last BM:  1/1- type 6   Height:   Ht Readings from Last 1 Encounters:  09/29/18 _0  (1.702 m)    Weight:   Wt Readings from Last 1 Encounters:  10/07/18 132.9 kg    Ideal Body Weight:  61.36 kg  BMI:  Body mass index is 45.91 kg/m.  Estimated Nutritional Needs:   Kcal:  2200-2500kcal/day   Protein:  >133g/day   Fluid:  >1.8L/day   CKoleen DistanceMS, RD, LDN Pager #- [phone]#- [phone] Hours Pager: [phone]

## 2018-10-08 NOTE — Anesthesia Postprocedure Evaluation (Signed)
Anesthesia Post Note  Patient: [name]  Procedure(s) Performed: AMPUTATION TOE LEFT 2ND (Left Foot)  Patient location during evaluation: PACU Anesthesia Type: General Level of consciousness: awake and alert and oriented Pain management: pain level controlled Vital Signs Assessment: post-procedure vital signs reviewed and stable Respiratory status: spontaneous breathing, nonlabored ventilation and respiratory function stable Cardiovascular status: blood pressure returned to baseline and stable Postop Assessment: no signs of nausea or vomiting Anesthetic complications: no     Last Vitals:  Vitals:   10/08/18 0857 10/08/18 0910  BP: (!) 140/53 (!) 142/53  Pulse: (!) 59 (!) 59  Resp: 17 16  Temp:  36.9 C  SpO2: 95% 96%    Last Pain:  Vitals:   10/08/18 0842  TempSrc:   PainSc: 0-No pain                 Codey Burling

## 2018-10-08 NOTE — Progress Notes (Signed)
Progress Note  Patient Name: [name] Date of Encounter: 10/08/2018  Primary Cardiologist: Esmond Plants, MD PhD  Subjective   Toe amputation this morning, Recovered from her sedation and procedure Per the notes, no complications Scheduled to go for central line placement this afternoon for antibiotics She is discouraged that she would not be able to go home with antibiotics Denies any further chills or fever Telemetry reviewed no significant arrhythmia  Inpatient Medications    Scheduled Meds: . amiodarone  400 mg Oral BID  . aspirin EC  81 mg Oral Daily  . insulin aspart  0-20 Units Subcutaneous TID WC  . insulin aspart  0-5 Units Subcutaneous QHS  . insulin aspart  3 Units Subcutaneous TID WC  . insulin glargine  20 Units Subcutaneous Daily  . lidocaine  1 patch Transdermal Q24H  . losartan  25 mg Oral Daily  . mouth rinse  15 mL Mouth Rinse q12n4p  . metoprolol tartrate  25 mg Oral Q6H  . multivitamin-lutein  1 capsule Oral Daily  . protein supplement shake  11 oz Oral BID BM  . rosuvastatin  40 mg Oral q1800  . sodium chloride flush  3 mL Intravenous Q12H  . sodium chloride flush  3 mL Intravenous Q12H  . vitamin C  250 mg Oral BID   Continuous Infusions: . sodium chloride Stopped (10/02/18 0322)  . sodium chloride 0 mL (10/07/18 1848)  .  ceFAZolin (ANCEF) IV 2 g (10/08/18 1215)   PRN Meds: sodium chloride, sodium chloride, acetaminophen **OR** acetaminophen, HYDROcodone-acetaminophen, morphine injection, [DISCONTINUED] ondansetron **OR** ondansetron (ZOFRAN) IV, oxyCODONE, promethazine, sodium chloride flush, sodium chloride flush, traMADol   Vital Signs    Vitals:   10/08/18 0857 10/08/18 0910 10/08/18 1009 10/08/18 1009  BP: (!) 140/53 (!) 142/53  (!) 148/55  Pulse: (!) 59 (!) 59 67 60  Resp: 17 16    Temp:  98.5 F (36.9 C)    TempSrc:      SpO2: 95% 96%  95%  Weight:      Height:        Intake/Output Summary (Last 24 hours) at 10/08/2018  1236 Last data filed at 10/08/2018 1055 Gross per 24 hour  Intake 1949.35 ml  Output 460 ml  Net 1489.35 ml   Filed Weights   10/04/18 0403 10/05/18 0300 10/07/18 0521  Weight: 128.4 kg 127.9 kg 132.9 kg    Telemetry    NSR with V-pacing and AV pacing.  - Personally Reviewed  ECG    - Personally Reviewed  Physical Exam   Constitutional:  oriented to person, place, and time. No distress.  Obese HENT:  Head: Grossly normal Eyes:  no discharge. No scleral icterus.  Neck: No JVD, no carotid bruits  Cardiovascular: Regular rate and rhythm, no murmurs appreciated Pulmonary/Chest: Clear to auscultation bilaterally, no wheezes or rails Abdominal: Soft.  no distension.  no tenderness.  Musculoskeletal: Normal range of motion Left foot in a bandage Neurological:  normal muscle tone. Coordination normal. No atrophy Skin: Skin warm and dry Psychiatric: normal affect, pleasant   Labs    Chemistry Recent Labs  Lab 10/05/18 0350 10/06/18 0433 10/07/18 0351  NA 134* 133* 131*  K 4.1 3.6 3.7  CL 101 101 99  CO2 24 23 23   GLUCOSE 168* 197* 221*  BUN 27* 25* 24*  CREATININE 1.34* 1.35* 1.46*  CALCIUM 7.9* 7.7* 7.6*  GFRNONAA 42* 41* 38*  GFRAA 48* 48* 44*  ANIONGAP 9 9  9     Hematology Recent Labs  Lab 10/04/18 0434 10/05/18 0350 10/07/18 0351  WBC 8.2 7.3 13.3*  RBC 3.54* 3.65* 3.36*  HGB 10.4* 10.9* 9.8*  HCT 31.6* 33.1* 30.2*  MCV 89.3 90.7 89.9  MCH 29.4 29.9 29.2  MCHC 32.9 32.9 32.5  RDW 13.0 12.9 13.0  PLT 123* 165 187    Cardiac EnzymesNo results for input(s): TROPONINI in the last 168 hours. No results for input(s): TROPIPOC in the last 168 hours.   BNPNo results for input(s): BNP, PROBNP in the last 168 hours.   DDimer No results for input(s): DDIMER in the last 168 hours.   Radiology    [place] Ekg Site Rite  Result Date: 10/08/2018 If [facility] image not attached, placement could not be confirmed due to current cardiac rhythm.   Cardiac Studies    LHC (10/06/18): Severe disease of LAD and diagonal branch Patent proximal LAD stent with severe stenosis at the distal edge of the stent Remainder of the LAD is also small, diffusely diseased, diagonal branch diffusely diseased -RCA large vessel dominant with mild disease, left circumflex/OM with mild disease proximal region   TEE (10/02/18): - Left ventricle: Systolic function was mildly reduced. The   estimated ejection fraction was in the range of 45% to 50%. - Aortic valve: No evidence of vegetation. There was trivial   regurgitation. - Mitral valve: There was mild regurgitation. - Left atrium: No evidence of thrombus in the atrial cavity or   appendage. - Right atrium: No evidence of thrombus in the atrial cavity or   appendage. - Atrial septum: Echo contrast study showed no right-to-left atrial   level shunt, at baseline or with provocation. - Pulmonic valve: No evidence of vegetation.  TEE (09/29/18): - Left ventricle: The cavity size was normal. Systolic function was   mildly reduced. The estimated ejection fraction was in the range   of 45% to 50%. The study is not technically sufficient to allow   evaluation of LV diastolic function. - Left atrium: The atrium was mildly dilated.  Patient Profile     63 y.o. female with history of multivessel CAD with CTO of the LAD which was treated with PCI at that time but the distal vessel was small in size and there was diffuse dissection that was treated with PTCA. She also has history of chronic systolic CHF s/p ICD CRT in 2015 with subsequent normalization of EF, osteomyelitis, DM2, HTN, obesity and OSA on CPAP who we are seeing for NSTEMI and MSSA bacteremia.  Assessment & Plan    NSTEMI catheterization showed severe lesion in the mid LAD at distal margin of previously placed stent.  Mid/distal LAD is relatively small and diffusely diseased. Also with severe diagonal disease, small vessel No angina this hospital admission,  will continue to treat medically --ASA and clopidogrel,  rosuvastatin 40 mg daily. Toe amputation today without any complications  Paroxysmal atrial tachycardia Unable to exclude atrial tachycardia versus atrial fibrillation/flutter Rates are in the 130 range on arrival and later in her hospital course, noted during cardiac catheterization broke with amiodarone infusion -Would continue amiodarone 200 twice daily for 2 weeks then down to 200 daily Change to metoprolol succinate 50 daily  MSSA bacteremia Most likely due to left toe infection.  TEE did not show vegetation,   amputation of infected toe completed today -----will need to follow closely following toe amputation, if continued fevers and chills, may need to remove ICD wire and device --Consider  close follow-up with Dr. Jolyn Nap, EP Recommended to the patient she call us for any further chills or fever.  Last episode several nights ago  Acute on chronic HFpEF LVEF mildly reduced by TTE and TEE.   -Continue metoprolol and losartan.  Thrombocytopenia Improving  Presumed secondary to sepsis   Total encounter time more than 25 minutes  Greater than 50% was spent in counseling and coordination of care with the patient   For questions or updates, please contact [place] Please consult [email or web address] for contact info under [facility] Cardiology  Signed, Ida Rogue, MD  10/08/2018, 12:36 PM

## 2018-10-08 NOTE — H&P (Signed)
H and P has been reviewed and no changes are noted.  

## 2018-10-08 NOTE — Transfer of Care (Signed)
Immediate Anesthesia Transfer of Care Note  Patient: [name]  Procedure(s) Performed: AMPUTATION TOE LEFT 2ND (Left Foot)  Patient Location: PACU  Anesthesia Type:MAC  Level of Consciousness: awake, alert  and oriented  Airway & Oxygen Therapy: Patient Spontanous Breathing and Patient connected to nasal cannula oxygen  Post-op Assessment: Report given to RN and Post -op Vital signs reviewed and stable  Post vital signs: Reviewed and stable  Last Vitals:  Vitals Value Taken Time  BP 132/75 10/08/2018  8:27 AM  Temp    Pulse 67 10/08/2018  8:27 AM  Resp 21 10/08/2018  8:27 AM  SpO2 92 % 10/08/2018  8:27 AM  Vitals shown include unvalidated device data.  Last Pain:  Vitals:   10/08/18 0455  TempSrc: Oral  PainSc:       Patients Stated Pain Goal: 0 (50/35/46 5681)  Complications: No apparent anesthesia complications

## 2018-10-08 NOTE — Progress Notes (Signed)
Date of Admission:  09/28/2018      ID: [name] is a 63 y.o. female  Active Problems:   Sepsis ([place]) MSSA bacteremia MSSA infection left 2 nd toe   Subjective: Had 2nd toe amputation this morning Doing fine No specific complaints  Medications:  . amiodarone  400 mg Oral BID  . aspirin EC  81 mg Oral Daily  . [START ON 10/09/2018] clopidogrel  75 mg Oral Daily  . insulin aspart  0-20 Units Subcutaneous TID WC  . insulin aspart  0-5 Units Subcutaneous QHS  . insulin aspart  3 Units Subcutaneous TID WC  . insulin glargine  20 Units Subcutaneous Daily  . lidocaine  1 patch Transdermal Q24H  . losartan  25 mg Oral Daily  . mouth rinse  15 mL Mouth Rinse q12n4p  . metoprolol tartrate  25 mg Oral Q6H  . multivitamin-lutein  1 capsule Oral Daily  . protein supplement shake  11 oz Oral BID BM  . rosuvastatin  40 mg Oral q1800  . sodium chloride flush  3 mL Intravenous Q12H  . sodium chloride flush  3 mL Intravenous Q12H  . vitamin C  250 mg Oral BID    Objective: Vital signs in last 24 hours: Temp:  [98.2 F (36.8 C)-98.5 F (36.9 C)] 98.5 F (36.9 C) (01/02 0910) Pulse Rate:  [56-67] 60 (01/02 1009) Resp:  [16-20] 16 (01/02 0910) BP: (109-148)/(51-75) 148/55 (01/02 1009) SpO2:  [90 %-98 %] 95 % (01/02 1009)  PHYSICAL EXAM:  General: Alert, cooperative, no distress, appears stated age. pale Head: Normocephalic, without obvious abnormality, atraumatic. Eyes: Conjunctivae clear, anicteric sclerae. Pupils are equal ENT Nares normal. No drainage or sinus tenderness. Lips, mucosa, and tongue normal. No Thrush Neck: Supple, symmetrical, no adenopathy, thyroid: non tender no carotid bruit and no JVD. Back: No CVA tenderness. Lungs: b/l air entry Heart: Regular rate and rhythm, no murmur, rub or gallop. Abdomen: Soft, non-tender,not distended. Bowel sounds normal. No masses Extremities: left foot surgical dressing Skin: No rashes or lesions. Or bruising Lymph:  Cervical, supraclavicular normal. Neurologic: Grossly non-focal  Lab Results Recent Labs    10/06/18 0433 10/07/18 0351  WBC  --  13.3*  HGB  --  9.8*  HCT  --  30.2*  NA 133* 131*  K 3.6 3.7  CL 101 99  CO2 23 23  BUN 25* 24*  CREATININE 1.35* 1.46*   CBC    Component Value Date/Time   WBC 13.3 (H) 10/07/2018 0351   RBC 3.36 (L) 10/07/2018 0351   HGB 9.8 (L) 10/07/2018 0351   HGB 12.3 04/15/2018 1532   HCT 30.2 (L) 10/07/2018 0351   HCT 38.0 04/15/2018 1532   PLT 187 10/07/2018 0351   PLT 178 04/15/2018 1532   MCV 89.9 10/07/2018 0351   MCV 91 04/15/2018 1532   MCH 29.2 10/07/2018 0351   MCHC 32.5 10/07/2018 0351   RDW 13.0 10/07/2018 0351   RDW 12.4 04/15/2018 1532   LYMPHSABS 1.3 09/28/2018 1213   LYMPHSABS 1.7 04/15/2018 1532   MONOABS 0.9 09/28/2018 1213   EOSABS 0.2 09/28/2018 1213   EOSABS 0.1 04/15/2018 1532   BASOSABS 0.0 09/28/2018 1213   BASOSABS 0.0 04/15/2018 1532   CBC Latest Ref Rng & Units 10/07/2018 10/05/2018 10/04/2018  WBC 4.0 - 10.5 K/uL 13.3(H) 7.3 8.2  Hemoglobin 12.0 - 15.0 g/dL 9.8(L) 10.9(L) 10.4(L)  Hematocrit 36.0 - 46.0 % 30.2(L) 33.1(L) 31.6(L)  Platelets 150 - 400 K/uL  187 165 123(L)   CMP Latest Ref Rng & Units 10/07/2018 10/06/2018 10/05/2018  Glucose 70 - 99 mg/dL 221(H) 197(H) 168(H)  BUN 8 - 23 mg/dL 24(H) 25(H) 27(H)  Creatinine 0.44 - 1.00 mg/dL 1.46(H) 1.35(H) 1.34(H)  Sodium 135 - 145 mmol/L 131(L) 133(L) 134(L)  Potassium 3.5 - 5.1 mmol/L 3.7 3.6 4.1  Chloride 98 - 111 mmol/L 99 101 101  CO2 22 - 32 mmol/L 23 23 24   Calcium 8.9 - 10.3 mg/dL 7.6(L) 7.7(L) 7.9(L)  Total Protein 6.5 - 8.1 g/dL - - -  Total Bilirubin 0.3 - 1.2 mg/dL - - -  Alkaline Phos 38 - 126 U/L - - -  AST 15 - 41 U/L - - -  ALT 0 - 44 U/L - - -    Microbiology: 12/23 : MSSA BC 12/24 BC -NG 12/25 Wound culture MSSA  Studies/Results: [place] Ekg Site Rite  Result Date: 10/08/2018 If Site Rite image not attached, placement could not be confirmed due  to current cardiac rhythm.    Assessment/Plan: 63 y.o.femalewith a history ofdiabetes mellitus, coronary artery disease, cardiomyopathy, AICD, was admitted with altered mental status ,vomiting and diarrhea of 1 day duration. ? ?Staph aureus bacteremia.source second toe left foot. Repeat blood culture from 12/24 promptly negative  TEE neg for vegetation or wire thrombus Had 2nd toe amputation today On cefazolin- will need until 11/10/18  Encephalopathy. due to infection- resolved  Left foot second toe infection.s/p amputation 10/08/18 Diabetes mellitus uncontrolled  AKI on CKD. improved--Combination of factors. Infection, hypovolemia.   NSTEMI-seen by cardiology- medical management  Thrombocytopenia resolved  Coronary artery disease with history of DES to LAD in 2013.  - Discussed the management with the patient

## 2018-10-08 NOTE — Op Note (Signed)
Operative note   Surgeon: Dr. Albertine Patricia, DPM.    Assistant: None    Preop diagnosis: Osteomyelitis second third PIP joints left foot    Postop diagnosis: Same    Procedure:   1.  Amputation second MTP joint level left foot          EBL: 15 cc    Anesthesia:IV sedation delivered by the anesthesia team I did a block with 0.5% Marcaine using 10 cc at the base of the toe    Hemostasis: Ankle tourniquet for 9 minutes at 225 mils mercury pressure    Specimen: Sent the amputated great toe for pathology.  I did a culture of the bone at the PIP joint as well.    Complications: None    Operative indications: Chronic wound diabetic associated wound with osteomyelitis and bone exposure to the PIP joint of the left second toe    Procedure:  Patient was brought into the OR and placed on the operating table in thesupine position. After anesthesia was obtained theleft lower extremity was prepped and draped in usual sterile fashion.  Operative Report: This time attention was directed to the second toe of the left foot 2 similar incisions were made around the base of the toe and carried down to bone and joint level.  The soft tissue attachments capsular as well as tendinous structures were released from the metatarsal phalangeal joint level and the toe was removed.  The wound on the dorsum of the PIP joint was opened up and a culture of the bone from the middle and and proximal phalanges were then sent for culture and sensitivity and the toe was sent to pathology.  At this time the area was copiously irrigated no purulence or evidence of infection was noted at this level.  The metatarsal head looked to be stable.  After further irrigation tourniquet is released bleeders were clamped bovied and tied as required.  Deep fascial capsule tissue was closed with 4-0 Vicryl in a continuous stitch.  Skin was then closed with 3-0 nylon simple interrupted horizontal mattress combination.  A sterile  dressings placed across the area at this timeframe consisting of Xeroform gauze 4 x 4's conformer and Kling.    Patient tolerated the procedure and anesthesia well.  Was transported from the OR to the PACU with all vital signs stable and vascular status intact. To be discharged per routine protocol.  Will follow up in approximately 1 week in the outpatient clinic.

## 2018-10-08 NOTE — Progress Notes (Signed)
Peripherally Inserted Central Catheter/Midline Placement  The IV Nurse has discussed with the patient and/or persons authorized to consent for the patient, the purpose of this procedure and the potential benefits and risks involved with this procedure.  The benefits include less needle sticks, lab draws from the catheter, and the patient may be discharged home with the catheter. Risks include, but not limited to, infection, bleeding, blood clot (thrombus formation), and puncture of an artery; nerve damage and irregular heartbeat and possibility to perform a PICC exchange if needed/ordered by physician.  Alternatives to this procedure were also discussed.  Bard Power PICC patient education guide, fact sheet on infection prevention and patient information card has been provided to patient /or left at bedside.    PICC/Midline Placement Documentation  PICC Single Lumen 63/94/32 Right Basilic 39 cm 0 cm (Active)       [name] 10/08/2018, 6:14 PM

## 2018-10-09 ENCOUNTER — Telehealth: Payer: Self-pay | Admitting: Family Medicine

## 2018-10-09 ENCOUNTER — Encounter: Payer: Self-pay | Admitting: Podiatry

## 2018-10-09 ENCOUNTER — Other Ambulatory Visit: Payer: Self-pay | Admitting: Cardiovascular Disease

## 2018-10-09 ENCOUNTER — Telehealth: Payer: Self-pay | Admitting: Internal Medicine

## 2018-10-09 LAB — GLUCOSE, CAPILLARY
GLUCOSE-CAPILLARY: 129 mg/dL — AB (ref 70–99)
Glucose-Capillary: 251 mg/dL — ABNORMAL HIGH (ref 70–99)

## 2018-10-09 LAB — BASIC METABOLIC PANEL
Anion gap: 7 (ref 5–15)
BUN: 29 mg/dL — ABNORMAL HIGH (ref 8–23)
CALCIUM: 7.5 mg/dL — AB (ref 8.9–10.3)
CO2: 24 mmol/L (ref 22–32)
Chloride: 100 mmol/L (ref 98–111)
Creatinine, Ser: 1.59 mg/dL — ABNORMAL HIGH (ref 0.44–1.00)
GFR calc Af Amer: 39 mL/min — ABNORMAL LOW (ref >60)
GFR calc non Af Amer: 34 mL/min — ABNORMAL LOW (ref >60)
Glucose, Bld: 134 mg/dL — ABNORMAL HIGH (ref 70–99)
Potassium: 3.8 mmol/L (ref 3.5–5.1)
Sodium: 131 mmol/L — ABNORMAL LOW (ref 135–145)

## 2018-10-09 LAB — SEDIMENTATION RATE: Sed Rate: 70 mm/hr — ABNORMAL HIGH (ref 0–30)

## 2018-10-09 LAB — SURGICAL PATHOLOGY

## 2018-10-09 MED ORDER — LOSARTAN POTASSIUM 25 MG PO TABS: 25.0000 mg | ORAL_TABLET | Freq: Every day | ORAL | 0 refills | Status: DC

## 2018-10-09 MED ORDER — HYDROCODONE-ACETAMINOPHEN 5-325 MG PO TABS: 1.0000 | ORAL_TABLET | Freq: Four times a day (QID) | ORAL | 0 refills | Status: DC | PRN

## 2018-10-09 MED ORDER — CLOPIDOGREL BISULFATE 75 MG PO TABS: 75.0000 mg | ORAL_TABLET | Freq: Every day | ORAL | 0 refills | Status: DC

## 2018-10-09 MED ORDER — CEFAZOLIN SODIUM-DEXTROSE 2-4 GM/100ML-% IV SOLN: 2.0000 g | Freq: Three times a day (TID) | INTRAVENOUS | 0 refills | Status: DC

## 2018-10-09 MED ORDER — LIDOCAINE 5 % EX PTCH: 1.0000 | MEDICATED_PATCH | CUTANEOUS | 0 refills | Status: DC

## 2018-10-09 MED ORDER — CARVEDILOL 12.5 MG PO TABS: 12.5000 mg | ORAL_TABLET | Freq: Two times a day (BID) | ORAL | Status: DC

## 2018-10-09 MED ORDER — INSULIN GLARGINE 100 UNIT/ML ~~LOC~~ SOLN: 20.0000 [IU] | Freq: Every day | SUBCUTANEOUS | 1 refills | Status: DC

## 2018-10-09 MED ORDER — TORSEMIDE 20 MG PO TABS
20.0000 mg | ORAL_TABLET | Freq: Every day | ORAL | Status: DC
  Administered 2018-10-09: 20 mg via ORAL
  Filled 2018-10-09: qty 1

## 2018-10-09 MED ORDER — ASPIRIN 81 MG PO TBEC: 81.0000 mg | DELAYED_RELEASE_TABLET | Freq: Every day | ORAL | 0 refills | Status: DC

## 2018-10-09 MED ORDER — PREMIER PROTEIN SHAKE: 11.0000 [oz_av] | Freq: Two times a day (BID) | ORAL | 0 refills | Status: DC

## 2018-10-09 MED ORDER — CARVEDILOL 12.5 MG PO TABS: 12.5000 mg | ORAL_TABLET | Freq: Two times a day (BID) | ORAL | 0 refills | Status: DC

## 2018-10-09 MED ORDER — AMIODARONE HCL 200 MG PO TABS: 200.0000 mg | ORAL_TABLET | Freq: Two times a day (BID) | ORAL | 0 refills | Status: DC

## 2018-10-09 MED ORDER — AMIODARONE HCL 200 MG PO TABS: 200.0000 mg | ORAL_TABLET | Freq: Two times a day (BID) | ORAL | Status: DC

## 2018-10-09 MED ORDER — NITROGLYCERIN 0.4 MG SL SUBL: 0.4000 mg | SUBLINGUAL_TABLET | SUBLINGUAL | 3 refills | Status: DC | PRN

## 2018-10-09 MED ORDER — CEFAZOLIN IV (FOR PTA / DISCHARGE USE ONLY): 2.0000 g | Freq: Three times a day (TID) | INTRAVENOUS | 0 refills | Status: DC

## 2018-10-09 NOTE — Telephone Encounter (Signed)
-----   Message from Arvil Chaco, PA-C sent at 10/08/2018 12:40 PM EST ----- Regarding: Follow-up appointment Greetings!  Please schedule [name] for follow-up with Dr. Caryl Comes within the next 2-3 weeks. Dr. Caryl Comes follows her for ICD / CRT (2015) and will soon be discharged from 2A.   She was seen in the hospital this week, during which time she had a toe infection / amputation and MSSA bacteremia but no infection seen on wire. She did have arrhythmia seen on telemetry during the hospitalization for which she received amio.   Thank you!  Signed, Arvil Chaco, PA-C 10/08/2018, 12:42 PM Pager [phone]

## 2018-10-09 NOTE — Care Management Important Message (Signed)
Copy of signed Medicare IM left with patient in room. 

## 2018-10-09 NOTE — Progress Notes (Signed)
Progress Note  Patient Name: [name] Date of Encounter: 10/09/2018  Primary Cardiologist: Esmond Plants, MD PhD  Subjective   Toe amputation yesterday, PICC line right arm yesterday Recovered from her sedation and procedure Telemetry reviewed, no further arrhythmia, maintaining normal sinus rhythm Overall feels well, would like to go home Denies any significant pain No further chills or fever  Inpatient Medications    Scheduled Meds: . amiodarone  200 mg Oral BID  . aspirin EC  81 mg Oral Daily  . carvedilol  12.5 mg Oral BID WC  . clopidogrel  75 mg Oral Daily  . insulin aspart  0-20 Units Subcutaneous TID WC  . insulin aspart  0-5 Units Subcutaneous QHS  . insulin aspart  3 Units Subcutaneous TID WC  . insulin glargine  20 Units Subcutaneous Daily  . lidocaine  1 patch Transdermal Q24H  . losartan  25 mg Oral Daily  . mouth rinse  15 mL Mouth Rinse q12n4p  . multivitamin-lutein  1 capsule Oral Daily  . protein supplement shake  11 oz Oral BID BM  . rosuvastatin  40 mg Oral q1800  . sodium chloride flush  10-40 mL Intracatheter Q12H  . sodium chloride flush  3 mL Intravenous Q12H  . sodium chloride flush  3 mL Intravenous Q12H  . torsemide  20 mg Oral Daily  . vitamin C  250 mg Oral BID   Continuous Infusions: . sodium chloride Stopped (10/02/18 0322)  . sodium chloride Stopped (10/08/18 1848)  .  ceFAZolin (ANCEF) IV 2 g (10/09/18 0902)   PRN Meds: sodium chloride, sodium chloride, acetaminophen **OR** acetaminophen, HYDROcodone-acetaminophen, morphine injection, [DISCONTINUED] ondansetron **OR** ondansetron (ZOFRAN) IV, oxyCODONE, promethazine, sodium chloride flush, sodium chloride flush, sodium chloride flush, traMADol   Vital Signs    Vitals:   10/09/18 0339 10/09/18 0417 10/09/18 0800 10/09/18 0839  BP: (!) 142/56  (!) 138/53   Pulse: (!) 59 60 (!) 59 62  Resp: 16  20   Temp: 98.3 F (36.8 C)  97.9 F (36.6 C)   TempSrc: Oral  Oral   SpO2: 94%   95%   Weight: 134.2 kg     Height:        Intake/Output Summary (Last 24 hours) at 10/09/2018 1335 Last data filed at 10/09/2018 0500 Gross per 24 hour  Intake 5.72 ml  Output 350 ml  Net -344.28 ml   Filed Weights   10/05/18 0300 10/07/18 0521 10/09/18 0339  Weight: 127.9 kg 132.9 kg 134.2 kg    Telemetry    NSR with V-pacing and AV pacing.  - Personally Reviewed  ECG    - Personally Reviewed  Physical Exam   Constitutional:  oriented to person, place, and time. No distress.  Obese laying supine HENT:  Head: Grossly normal Eyes:  no discharge. No scleral icterus.  Neck: No JVD, no carotid bruits  Cardiovascular: Regular rate and rhythm, no murmurs appreciated Pulmonary/Chest: Clear to auscultation bilaterally, scattered Rales Abdominal: Soft.  no distension.  no tenderness.  Musculoskeletal: Normal range of motion Neurological:  normal muscle tone. Coordination normal. No atrophy Skin: Skin warm and dry Psychiatric: normal affect, pleasant   Labs    Chemistry Recent Labs  Lab 10/06/18 0433 10/07/18 0351 10/09/18 0635  NA 133* 131* 131*  K 3.6 3.7 3.8  CL 101 99 100  CO2 23 23 24   GLUCOSE 197* 221* 134*  BUN 25* 24* 29*  CREATININE 1.35* 1.46* 1.59*  CALCIUM 7.7* 7.6*  7.5*  GFRNONAA 41* 38* 34*  GFRAA 48* 44* 39*  ANIONGAP 9 9 7      Hematology Recent Labs  Lab 10/04/18 0434 10/05/18 0350 10/07/18 0351  WBC 8.2 7.3 13.3*  RBC 3.54* 3.65* 3.36*  HGB 10.4* 10.9* 9.8*  HCT 31.6* 33.1* 30.2*  MCV 89.3 90.7 89.9  MCH 29.4 29.9 29.2  MCHC 32.9 32.9 32.5  RDW 13.0 12.9 13.0  PLT 123* 165 187    Cardiac EnzymesNo results for input(s): TROPONINI in the last 168 hours. No results for input(s): TROPIPOC in the last 168 hours.   BNPNo results for input(s): BNP, PROBNP in the last 168 hours.   DDimer No results for input(s): DDIMER in the last 168 hours.   Radiology    [place] Ekg Site Rite  Result Date: 10/08/2018 If [facility] image not attached,  placement could not be confirmed due to current cardiac rhythm.   Cardiac Studies   LHC (10/06/18): Severe disease of LAD and diagonal branch Patent proximal LAD stent with severe stenosis at the distal edge of the stent Remainder of the LAD is also small, diffusely diseased, diagonal branch diffusely diseased -RCA large vessel dominant with mild disease, left circumflex/OM with mild disease proximal region   TEE (10/02/18): - Left ventricle: Systolic function was mildly reduced. The   estimated ejection fraction was in the range of 45% to 50%. - Aortic valve: No evidence of vegetation. There was trivial   regurgitation. - Mitral valve: There was mild regurgitation. - Left atrium: No evidence of thrombus in the atrial cavity or   appendage. - Right atrium: No evidence of thrombus in the atrial cavity or   appendage. - Atrial septum: Echo contrast study showed no right-to-left atrial   level shunt, at baseline or with provocation. - Pulmonic valve: No evidence of vegetation.  TEE (09/29/18): - Left ventricle: The cavity size was normal. Systolic function was   mildly reduced. The estimated ejection fraction was in the range   of 45% to 50%. The study is not technically sufficient to allow   evaluation of LV diastolic function. - Left atrium: The atrium was mildly dilated.  Patient Profile     63 y.o. female with history of multivessel CAD with CTO of the LAD which was treated with PCI at that time but the distal vessel was small in size and there was diffuse dissection that was treated with PTCA. She also has history of chronic systolic CHF s/p ICD CRT in 2015 with subsequent normalization of EF, osteomyelitis, DM2, HTN, obesity and OSA on CPAP who we are seeing for NSTEMI and MSSA bacteremia.  Assessment & Plan    NSTEMI catheterization showed severe lesion in the mid LAD at distal margin of previously placed stent.  Mid/distal LAD is relatively small and diffusely  diseased. Also with severe diagonal disease, small vessel No angina this hospital admission, will continue to treat medically --ASA and clopidogrel restarted,  rosuvastatin 40 mg daily. -Metoprolol held, started back on carvedilol  Paroxysmal atrial tachycardia Unable to exclude atrial tachycardia versus atrial fibrillation/flutter Rates 130 on arrival this admission in the setting of respiratory distress and later in her hospital course, noted during cardiac catheterization broke with adenosine but did not hold, started on amiodarone infusion and rhythm broke -Would continue amiodarone 200 twice daily for 2 weeks then down to 200 daily Change metoprolol to carvedilol 12.5 twice daily Follow-up with Dr. Caryl Comes  MSSA bacteremia Most likely due to left toe infection.  TEE did not show vegetation,   amputation of infected toe completed today -----will need to follow closely following toe amputation, if continued fevers and chills, may need to remove ICD wire and device --close follow-up with Dr. Jolyn Nap, EP Again recommended she call our office for any chills and fever  Acute on chronic HFpEF LVEF mildly reduced by TTE and TEE.   -Continue carvedilol and losartan.  Restart torsemide  Thrombocytopenia Back to baseline, 187.  She had low at 53 Presumed secondary to sepsis   Total encounter time more than 25 minutes  Greater than 50% was spent in counseling and coordination of care with the patient   For questions or updates, please contact Alex Please consult [email or web address] for contact info under [facility] Cardiology  Signed, Ida Rogue, MD  10/09/2018, 1:35 PM

## 2018-10-09 NOTE — Plan of Care (Signed)
  Problem: Health Behavior/Discharge Planning: Goal: Ability to manage health-related needs will improve Outcome: Progressing   Problem: Clinical Measurements: Goal: Will remain free from infection Outcome: Progressing   Problem: Safety: Goal: Ability to remain free from injury will improve Outcome: Progressing   Problem: Education: Goal: Understanding of CV disease, CV risk reduction, and recovery process will improve Outcome: Progressing

## 2018-10-09 NOTE — Treatment Plan (Signed)
Diagnosis: MSSA bacteremia and osteomyelitis Baseline Creatinine 1.46  Culture Result: MSSA No Known Allergies  OPAT Orders Discharge antibiotics: Cefazolin 2 grams IV every 8 hours until 11/10/18  PIC Care Per Protocol:  Labs weekly while on IV antibiotics: __X CBC with differential __ BMP X__ CMP  _X_ ESR  __ Please pull PIC at completion of IV antibiotics  Fax weekly labs to [phone]  Clinic Follow Up Appt 3 weeks:   Call [phone] to make appt    

## 2018-10-09 NOTE — Progress Notes (Signed)
PHARMACY CONSULT NOTE FOR:  OUTPATIENT  PARENTERAL ANTIBIOTIC THERAPY (OPAT)  Indication: MSSA bacteremia and toe infection Regimen: cefazolin 2gm IV q8h End date: 11/10/2018  IV antibiotic discharge orders are pended. To discharging provider:  please sign these orders via discharge navigator,  Select New Orders & click on the button choice - Manage This Unsigned Work.     Thank you for allowing pharmacy to be a part of this patient's care.  Doreene Eland, PharmD, BCPS.   Work Cell: [phone] 10/09/2018 10:43 AM

## 2018-10-09 NOTE — Progress Notes (Signed)
Pt will discharge home via wheelchair. Pt measured for TED hose per [facility] request and given to patient. Pt given post op boot per order. RUA PICC line left intact for home IV abx therapy. I will continue to assess.

## 2018-10-09 NOTE — Progress Notes (Signed)
[facility]                                                      Patient Demographics  [name], is a 63 y.o. female   MRN: [id number]   DOB - 1956-09-16  Admit Date - 09/28/2018    Outpatient Primary MD for the patient is Rutherford Guys, MD  Consult requested in the Hospital by Salary, Avel Peace, MD, On 10/09/2018    With History of -  Past Medical History:  Diagnosis Date  . Acute myocardial infarction, subendocardial infarction, initial episode of care ([place]) 07/21/2012  . Acute osteomyelitis involving ankle and foot ([place]) 02/06/2015  . Acute systolic heart failure ([place]) 07/21/2012   New onset 07/19/12; admission to [facility] ED. Elevated Troponins.  S/p 2D-echo with EF 20-25%.  S/p cardiac catheterization with stenting LAD.  Repeat 2D-echo 10/2011 with improved EF of 35%.   . Anemia   . Automatic implantable cardioverter-defibrillator in situ    a. MDT CRT-D 06/2014, SN: [id number]    . CAD (coronary artery disease)    a. cardiac cath 101/04/2012: PCI/DES to chronically occluded mLAD, consideration PCI to diag branch in 4 weeks.   . Cataract   . Chicken pox   . Chronic systolic CHF (congestive heart failure) (HCC)    a. mixed ICM & NICM; b. EF 20-25% by echo 07/2012, mid-dist 2/3 of LV sev HK/AK, mild MR. echo 10/2012: EF 30-35%, sev HK ant-septal & inf walls, GR1DD, mild MR, PASP 33. c. echo 02/2013: EF 30%, GR1DD, mild MR. echo 04/2014: EF 30%, Septal-lat dyssynchrony, global HK, inf AK, GR1DD, mild MR. d. echo 10/2014: EF50-55%, WM nl, GR1DD, septal mild paradox. e. echo 02/2015: EF 50-55%, wm   . Depression   . Heart attack ([place])   . Heel ulcer ([place]) 04/27/2015  . History of blood transfusion ~ 2011   "plasma; had neuropathy; couldn't walk"  . Hypertension   . LBBB (left bundle branch block)   . Neuromuscular disorder ([place])   . Neuropathy 2011   . Obesity, unspecified   . OSA on CPAP    Moderate with AHI 23/hr and now on CPAP at 16cm H2O.  Her DME is [facility]  . Pure hypercholesterolemia   . Type II diabetes mellitus ([place])   . Unspecified vitamin D deficiency       Past Surgical History:  Procedure Laterality Date  . AMPUTATION TOE Right 06/18/2015   Procedure: AMPUTATION TOE;  Surgeon: Samara Deist, DPM;  Location: [facility] ORS;  Service: Podiatry;  Laterality: Right;  . AMPUTATION TOE Left 10/08/2018   Procedure: AMPUTATION TOE LEFT 2ND;  Surgeon: Albertine Patricia, DPM;  Location: [facility] ORS;  Service: Podiatry;  Laterality: Left;  . BACK SURGERY    . BI-VENTRICULAR IMPLANTABLE CARDIOVERTER DEFIBRILLATOR N/A 07/06/2014   Procedure: BI-VENTRICULAR IMPLANTABLE CARDIOVERTER DEFIBRILLATOR  (CRT-D);  Surgeon: Deboraha Sprang, MD;  Location: [facility] CATH LAB;  Service: Cardiovascular;  Laterality: N/A;  . BI-VENTRICULAR IMPLANTABLE CARDIOVERTER DEFIBRILLATOR  (CRT-D)  07/06/2014  . BILATERAL OOPHORECTOMY  01/2011   ovarian cyst benign  . COLONOSCOPY WITH PROPOFOL Left 02/22/2015   Procedure: COLONOSCOPY WITH PROPOFOL;  Surgeon: Hulen Luster, MD;  Location: [facility] ENDOSCOPY;  Service: Endoscopy;  Laterality: Left;  . CORONARY ANGIOPLASTY WITH STENT PLACEMENT Left 07/2012   new  onset systolic CHF; elevated troponins.  Cardiac catheterization with stenting to LAD; EF 15%.  2D-echo: EF 20-25%.  . ESOPHAGOGASTRODUODENOSCOPY N/A 02/22/2015   Procedure: ESOPHAGOGASTRODUODENOSCOPY (EGD);  Surgeon: Hulen Luster, MD;  Location: [facility] ENDOSCOPY;  Service: Endoscopy;  Laterality: N/A;  . INCISION AND DRAINAGE ABSCESS Right 2007   groin; with ICU stay due to sepsis.  Marland Kitchen LAPAROSCOPIC CHOLECYSTECTOMY  2011  . LEFT HEART CATH AND CORONARY ANGIOGRAPHY N/A 10/05/2018   Procedure: LEFT HEART CATH AND CORONARY ANGIOGRAPHY;  Surgeon: Minna Merritts, MD;  Location: [place] CV LAB;  Service: Cardiovascular;  Laterality: N/A;  . LEFT HEART CATHETERIZATION WITH CORONARY  ANGIOGRAM N/A 07/21/2012   Procedure: LEFT HEART CATHETERIZATION WITH CORONARY ANGIOGRAM;  Surgeon: Jolaine Artist, MD;  Location: [facility] CATH LAB;  Service: Cardiovascular;  Laterality: N/A;  . [place]   L4-5  . PERCUTANEOUS CORONARY STENT INTERVENTION (PCI-S) N/A 07/23/2012   Procedure: PERCUTANEOUS CORONARY STENT INTERVENTION (PCI-S);  Surgeon: Sherren Mocha, MD;  Location: [facility] CATH LAB;  Service: Cardiovascular;  Laterality: N/A;  . PERIPHERAL VASCULAR BALLOON ANGIOPLASTY Left 10/06/2018   Procedure: PERIPHERAL VASCULAR BALLOON ANGIOPLASTY;  Surgeon: Katha Cabal, MD;  Location: [place] CV LAB;  Service: Cardiovascular;  Laterality: Left;  . PERIPHERAL VASCULAR CATHETERIZATION N/A 02/10/2015   Procedure: Picc Line Insertion;  Surgeon: Katha Cabal, MD;  Location: [place] CV LAB;  Service: Cardiovascular;  Laterality: N/A;  . TEE WITHOUT CARDIOVERSION N/A 10/02/2018   Procedure: TRANSESOPHAGEAL ECHOCARDIOGRAM (TEE);  Surgeon: Wellington Hampshire, MD;  Location: [facility] ORS;  Service: Cardiovascular;  Laterality: N/A;  . [place]  . VAGINAL HYSTERECTOMY  01/2011   Fibroids/DUB.  Ovaries removed. [place].    in for   Chief Complaint  Patient presents with  . Code Sepsis     HPI  [name]  is a 63 y.o. female, 1 day status post second toe amputation left foot MTP joint level.  States she doing well has no complaints minimal pain    Review of Systems    In addition to the HPI above,  No Fever-chills, No Headache, No changes with Vision or hearing, No problems swallowing food or Liquids, No Chest pain, Cough or Shortness of Breath, No Abdominal pain, No Nausea or Vommitting, Bowel movements are regular, No Blood in stool or Urine, No dysuria, No new skin rashes or bruises, No new joints pains-aches,  No new weakness, tingling, numbness in any extremity, No recent weight gain or loss, No polyuria, polydypsia or  polyphagia, No significant Mental Stressors.  A full 10 point Review of Systems was done, except as stated above, all other Review of Systems were negative.   Social History Social History   Tobacco Use  . Smoking status: Never Smoker  . Smokeless tobacco: Never Used  Substance Use Topics  . Alcohol use: No    Alcohol/week: 0.0 standard drinks    Family History Family History  Problem Relation Age of Onset  . Diabetes Mother   . Hypertension Mother   . Arthritis Mother        knees, lumbar DDD, cervical DDD  . Cancer Father        prostate,skin,lymphoma.  . Cancer Brother 71       bladder cancer; non-smoker  . Diabetes Maternal Grandmother   . Heart disease Maternal Grandmother   . COPD Maternal Grandmother   . Diabetes Paternal Grandmother   . Obesity Brother   . Diabetes  Son   . Hypertension Son   . Cancer Maternal Grandfather   . Diabetes Paternal Grandfather     Prior to Admission medications   Medication Sig Start Date End Date Taking? Authorizing Provider  AMBULATORY NON FORMULARY MEDICATION 1 Units by Other route once. Medication Name: foot brace 11/15/15  Yes Patel, Donika K, DO  Calcium Carb-Cholecalciferol (CALCIUM 600 + D PO) Take 1 tablet by mouth 2 (two) times daily.   Yes [provider]  citalopram (CELEXA) 20 MG tablet Take 1 tablet (20 mg total) by mouth daily. 04/15/18  Yes Wardell Honour, MD  clopidogrel (PLAVIX) 75 MG tablet Take 1 tablet (75 mg total) by mouth daily. Patient taking differently: Take 75 mg by mouth at bedtime.  04/15/18  Yes Wardell Honour, MD  ferrous sulfate 325 (65 FE) MG tablet Take 325 mg by mouth daily.   Yes [provider]  glimepiride (AMARYL) 4 MG tablet Take 1 tablet (4 mg total) by mouth 2 (two) times daily with a meal. 04/15/18  Yes Wardell Honour, MD  glucose blood (ONE TOUCH ULTRA TEST) test strip Check sugar three times daily dx: DMII uncontrolled insulin dependent with complications 01/14/80  Yes  Wardell Honour, MD  Insulin Glargine (LANTUS) 100 UNIT/ML Solostar Pen Inject 30 Units into the skin at bedtime. 04/15/18  Yes Wardell Honour, MD  Insulin Pen Needle (B-D ULTRAFINE III SHORT PEN) 31G X 8 MM MISC USE AS DIRECTED WITH LANTUS; dx: DMII uncontrolled, insulin dependent, with complications 1/91/47  Yes Wardell Honour, MD  losartan (COZAAR) 25 MG tablet Take 25 mg by mouth at bedtime.   Yes [provider]  omega-3 acid ethyl esters (LOVAZA) 1 g capsule Take 1 g by mouth 2 (two) times daily.   Yes [provider]  torsemide (DEMADEX) 20 MG tablet Take 1 tablet (20 mg total) by mouth daily. Patient taking differently: Take 20 mg by mouth at bedtime.  04/15/18  Yes Wardell Honour, MD  traMADol (ULTRAM) 50 MG tablet Take 1 tablet (50 mg total) by mouth every 6 (six) hours as needed. for pain Patient taking differently: Take 50 mg by mouth every 6 (six) hours as needed for moderate pain.  04/15/18  Yes Wardell Honour, MD  amiodarone (PACERONE) 200 MG tablet Take 1 tablet (200 mg total) by mouth 2 (two) times daily. 10/09/18   Salary, Holly Bodily D, MD  aspirin EC 81 MG EC tablet Take 1 tablet (81 mg total) by mouth daily. 10/10/18   Salary, Avel Peace, MD  carvedilol (COREG) 12.5 MG tablet Take 1 tablet (12.5 mg total) by mouth 2 (two) times daily with a meal. 10/09/18   Salary, Holly Bodily D, MD  ceFAZolin (ANCEF) 2-4 GM/100ML-% IVPB Inject 100 mLs (2 g total) into the vein every 8 (eight) hours. 10/09/18 11/10/18  Salary, Avel Peace, MD  ceFAZolin (ANCEF) IVPB Inject 2 g into the vein every 8 (eight) hours. Indication: MSSA bacteremia and toe infection Last Day of Therapy:  11/10/2018 Labs - Once weekly:  CBC/D and BMP, Labs - Every other week:  ESR and CRP 10/10/18 11/10/18  Salary, Holly Bodily D, MD  clopidogrel (PLAVIX) 75 MG tablet Take 1 tablet (75 mg total) by mouth daily. 10/10/18   Salary, Avel Peace, MD  Dulaglutide (TRULICITY) 8.29 FA/2.1HY SOPN Inject 0.75 mg into the skin once a  week. Patient not taking: Reported on 09/28/2018 04/15/18   Wardell Honour, MD  gabapentin (NEURONTIN) 600  MG tablet Take 1 tablet (600 mg total) by mouth 3 (three) times daily. Patient not taking: Reported on 09/28/2018 04/15/18   Wardell Honour, MD  HYDROcodone-acetaminophen (NORCO/VICODIN) 5-325 MG tablet Take 1-2 tablets by mouth every 6 (six) hours as needed for severe pain. 10/09/18   Salary, Avel Peace, MD  insulin glargine (LANTUS) 100 UNIT/ML injection Inject 0.2 mLs (20 Units total) into the skin daily. 10/10/18   Salary, Avel Peace, MD  lidocaine (LIDODERM) 5 % Place 1 patch onto the skin daily. Remove & Discard patch within 12 hours or as directed by MD 10/09/18   Salary, Avel Peace, MD  losartan (COZAAR) 25 MG tablet Take 1 tablet (25 mg total) by mouth daily. 10/10/18   Salary, Avel Peace, MD  protein supplement shake (PREMIER PROTEIN) LIQD Take 325 mLs (11 oz total) by mouth 2 (two) times daily between meals. 10/09/18   Salary, Avel Peace, MD  rosuvastatin (CRESTOR) 40 MG tablet TAKE 1 TABLET BY MOUTH ONCE DAILY Patient not taking: Reported on 09/28/2018 12/16/17   Minna Merritts, MD    Anti-infectives (From admission, onward)   Start     Dose/Rate Route Frequency Ordered Stop   10/10/18 0000  ceFAZolin (ANCEF) IVPB     2 g Intravenous Every 8 hours 10/09/18 1123 11/10/18 2359   10/09/18 0000  ceFAZolin (ANCEF) 2-4 GM/100ML-% IVPB     2 g 200 mL/hr over 30 Minutes Intravenous Every 8 hours 10/09/18 1123 11/10/18 2359   10/01/18 0900  ceFAZolin (ANCEF) IVPB 2g/100 mL premix     2 g 200 mL/hr over 30 Minutes Intravenous Every 8 hours 10/01/18 0813     09/30/18 2200  ceFAZolin (ANCEF) IVPB 2g/100 mL premix  Status:  Discontinued     2 g 200 mL/hr over 30 Minutes Intravenous Every 12 hours 09/30/18 1036 10/01/18 0813   09/29/18 2200  ceFAZolin (ANCEF) IVPB 1 g/50 mL premix  Status:  Discontinued     1 g 100 mL/hr over 30 Minutes Intravenous Every 12 hours 09/29/18 1505 09/30/18 1036    09/29/18 1000  ceFAZolin (ANCEF) IVPB 2g/100 mL premix  Status:  Discontinued     2 g 200 mL/hr over 30 Minutes Intravenous Every 8 hours 09/29/18 0921 09/29/18 1505   09/29/18 0000  ceFEPIme (MAXIPIME) 2 g in sodium chloride 0.9 % 100 mL IVPB  Status:  Discontinued     2 g 200 mL/hr over 30 Minutes Intravenous Every 12 hours 09/28/18 1318 09/29/18 0919   09/28/18 1830  vancomycin (VANCOCIN) 1,250 mg in sodium chloride 0.9 % 250 mL IVPB  Status:  Discontinued     1,250 mg 166.7 mL/hr over 90 Minutes Intravenous Every 18 hours 09/28/18 1318 09/29/18 1235   09/28/18 1200  ceFEPIme (MAXIPIME) 2 g in sodium chloride 0.9 % 100 mL IVPB     2 g 200 mL/hr over 30 Minutes Intravenous  Once 09/28/18 1156 09/28/18 1301   09/28/18 1200  metroNIDAZOLE (FLAGYL) IVPB 500 mg  Status:  Discontinued     500 mg 100 mL/hr over 60 Minutes Intravenous Every 8 hours 09/28/18 1156 09/29/18 0919   09/28/18 1200  vancomycin (VANCOCIN) IVPB 1000 mg/200 mL premix     1,000 mg 200 mL/hr over 60 Minutes Intravenous  Once 09/28/18 1156 09/28/18 1339      Scheduled Meds: . amiodarone  200 mg Oral BID  . aspirin EC  81 mg Oral Daily  . carvedilol  12.5 mg Oral BID WC  .  clopidogrel  75 mg Oral Daily  . insulin aspart  0-20 Units Subcutaneous TID WC  . insulin aspart  0-5 Units Subcutaneous QHS  . insulin aspart  3 Units Subcutaneous TID WC  . insulin glargine  20 Units Subcutaneous Daily  . lidocaine  1 patch Transdermal Q24H  . losartan  25 mg Oral Daily  . mouth rinse  15 mL Mouth Rinse q12n4p  . multivitamin-lutein  1 capsule Oral Daily  . protein supplement shake  11 oz Oral BID BM  . rosuvastatin  40 mg Oral q1800  . sodium chloride flush  10-40 mL Intracatheter Q12H  . sodium chloride flush  3 mL Intravenous Q12H  . sodium chloride flush  3 mL Intravenous Q12H  . torsemide  20 mg Oral Daily  . vitamin C  250 mg Oral BID   Continuous Infusions: . sodium chloride Stopped (10/02/18 0322)  . sodium  chloride Stopped (10/08/18 1848)  .  ceFAZolin (ANCEF) IV 2 g (10/09/18 0902)   PRN Meds:.sodium chloride, sodium chloride, acetaminophen **OR** acetaminophen, HYDROcodone-acetaminophen, morphine injection, [DISCONTINUED] ondansetron **OR** ondansetron (ZOFRAN) IV, oxyCODONE, promethazine, sodium chloride flush, sodium chloride flush, sodium chloride flush, traMADol  No Known Allergies  Physical Exam  Vitals  Blood pressure (!) 138/53, pulse 62, temperature 97.9 F (36.6 C), temperature source Oral, resp. rate 20, height _0  (1.702 m), weight 134.2 kg, SpO2 95 %.  Lower Extremity exam: Dressing change today wound appears very stable incision margins intact no dehiscence no redness cellulitis or drainage from the wound at this point. Data Review  CBC Recent Labs  Lab 10/03/18 0418 10/04/18 0434 10/05/18 0350 10/07/18 0351  WBC 10.2 8.2 7.3 13.3*  HGB 10.7* 10.4* 10.9* 9.8*  HCT 32.8* 31.6* 33.1* 30.2*  PLT 80* 123* 165 187  MCV 90.9 89.3 90.7 89.9  MCH 29.6 29.4 29.9 29.2  MCHC 32.6 32.9 32.9 32.5  RDW 13.1 13.0 12.9 13.0   ------------------------------------------------------------------------------------------------------------------  Chemistries  Recent Labs  Lab 10/02/18 1513  10/04/18 0434 10/05/18 0350 10/06/18 0433 10/07/18 0351 10/09/18 0635  NA  --    < > 135 134* 133* 131* 131*  K  --    < > 3.6 4.1 3.6 3.7 3.8  CL  --    < > 103 101 101 99 100  CO2  --    < > _1 GLUCOSE  --    < > 190* 168* 197* 221* 134*  BUN  --    < > 29* 27* 25* 24* 29*  CREATININE  --    < > 1.20* 1.34* 1.35* 1.46* 1.59*  CALCIUM  --    < > 7.7* 7.9* 7.7* 7.6* 7.5*  MG 1.8  --   --   --   --   --   --    < > = values in this interval not displayed.   --------------------- Assessment & Plan: Patient is doing very well this think she stable at this juncture.  I will make sure she gets a postoperative shoe and let her start to have some bathroom privileges to build  to get up and move from the bed to the chair and also have bathroom privileges wearing the postop shoe.  Antibiotics and release are dependent upon her overall health and cardiac state.  From my perspective I think she is doing well and I can leave the dressing during dry clean and intact and check her next week for follow-up.  When she goes home I want decreased activity just bathroom privileges and movement from bed to chair.  Active Problems:   Sepsis [facility])   Family Communication: Plan discussed with patient  Albertine Patricia M.D on 10/09/2018 at 12:29 PM  Thank you for the consult, we will follow the patient with you in the Hospital.

## 2018-10-09 NOTE — Telephone Encounter (Signed)
LVM for pt to call the office and schedule a Hospital F/U with Dr. Pamella Pert. When pt calls back, please call [place] and ask for Tanya. Thank you!

## 2018-10-09 NOTE — Telephone Encounter (Signed)
Please advise on when to schedule follow up Currently no availability for recommended time frame

## 2018-10-09 NOTE — Evaluation (Signed)
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 Pt reported living in two story home with 24/7 assistance from family available, with a wide range of DME available. Previously independent.   Pt demonstrated sit <> stand x2 this session with minAx2 for safety. Assistance for LE dressing, pt able to maintain balance with RW assist. Mobilized to WC in room for transport with CGA and RW. Post op shoe in place throughout mobility. Precautions reviewed, as well as safety in home and WC management of 1 STE home, pt was able to teach back. The patient would benefit from further skilled PT continue to progress towards PLOF and to maximize independence, mobility and safety as able.     Follow Up Recommendations Home health PT    Equipment Recommendations  None recommended by PT;Other (comment)(Pt reported having BSC, RW, SPC and manual WC at home)    Recommendations for Other Services       Precautions / Restrictions Precautions Precautions: Other (comment) Precaution Comments: L post op shoe at all times, bed to bathroom, bed to chair privelages, 50steps max Required Braces or Orthoses: Other Brace(post op shoe) Restrictions Weight Bearing Restrictions: Yes LLE Weight Bearing: (see above)      Mobility  Bed Mobility               General bed mobility comments: deferred sitting EOB  Transfers Overall transfer level: Needs assistance   Transfers: Sit to/from Stand Sit to Stand: Min  assist;+2 safety/equipment            Ambulation/Gait Ambulation/Gait assistance: Min guard Gait Distance (Feet): 1 Feet Assistive device: Rolling walker (2 wheeled) Gait Pattern/deviations: Patent attorney    Modified Rankin (Stroke Patients Only)       Balance Overall balance assessment: Needs assistance   Sitting balance-Leahy Scale: Fair       Standing balance-Leahy Scale: Fair                               Pertinent Vitals/Pain Pain Assessment: Faces Faces Pain Scale: Hurts little more Pain Location: lwo back pain Pain Intervention(s): Limited activity within patient's tolerance;Monitored during session;Repositioned    Home Living Family/patient expects to be discharged to:: Private residence Living Arrangements: Spouse/significant other;Children Available Help at Discharge: Family;Available 24 hours/day Type of Home: House Home Access: Other (comment)(1 small threshold step, able to navigate with WC)     Home Layout: Two level;Able to live on main level with bedroom/bathroom Home Equipment: Gilford Rile - 2 wheels;Bedside commode;Shower seat;Cane - single point;Wheelchair - manual      Prior Function Level of Independence: Independent         Comments: Pt husband very involved, able to assist as needed. Pt reported prior to admission independent. Denies falls in the last 6 months     Hand Dominance        Extremity/Trunk Assessment   Upper  Extremity Assessment Upper Extremity Assessment: Overall WFL for tasks assessed    Lower Extremity Assessment Lower Extremity Assessment: Generalized weakness       Communication   Communication: No difficulties  Cognition Arousal/Alertness: Awake/alert Behavior During Therapy: WFL for tasks assessed/performed Overall Cognitive Status: Within Functional Limits for tasks assessed                                        General  Comments      Exercises Other Exercises Other Exercises: Pt educated about post op shoe and restrictions of mobility (bed to bathroom or chair, 50steps max per podiatry)   Assessment/Plan    PT Assessment Patient needs continued PT services  PT Problem List Decreased strength;Pain;Decreased range of motion;Decreased activity tolerance;Decreased balance;Decreased knowledge of precautions;Decreased mobility;Obesity       PT Treatment Interventions DME instruction;Therapeutic exercise;Gait training;Balance training;Stair training;Neuromuscular re-education;Functional mobility training;Therapeutic activities;Patient/family education;Manual techniques    PT Goals (Current goals can be found in the Care Plan section)  Acute Rehab PT Goals Patient Stated Goal: to go home PT Goal Formulation: With patient Time For Goal Achievement: 10/23/18 Potential to Achieve Goals: Good    Frequency Min 2X/week   Barriers to discharge        Co-evaluation               AM-PAC PT "6 Clicks" Mobility  Outcome Measure Help needed turning from your back to your side while in a flat bed without using bedrails?: A Little Help needed moving from lying on your back to sitting on the side of a flat bed without using bedrails?: A Little Help needed moving to and from a bed to a chair (including a wheelchair)?: A Little Help needed standing up from a chair using your arms (e.g., wheelchair or bedside chair)?: A Little Help needed to walk in hospital room?: A Little Help needed climbing 3-5 steps with a railing? : A Lot 6 Click Score: 17    End of Session Equipment Utilized During Treatment: Gait belt Activity Tolerance: Patient tolerated treatment well Patient left: in chair;with nursing/sitter in room;with family/visitor present Nurse Communication: Mobility status PT Visit Diagnosis: Unsteadiness on feet (R26.81);Other abnormalities of gait and mobility (R26.89);Difficulty in walking, not  elsewhere classified (R26.2);Pain Pain - Right/Left: Left Pain - part of body: Ankle and joints of foot    Time: 1443-1540 PT Time Calculation (min) (ACUTE ONLY): 13 min   Charges:   PT Evaluation $PT Eval Moderate Complexity: 1 Mod          Lieutenant Diego PT, DPT 1:48 PM,10/09/18 [phone]

## 2018-10-09 NOTE — Care Management Note (Signed)
Case Management Note  Patient Details  Name: [name] MRN: [id number] Date of Birth: [birth year]  Subjective/Objective:     Patient will be discharging to home today with husband.  She states she does not need home health PT or RN.  Will be receiving home IV antibiotics through February 4th; Ancef 2 gm three times a day.  Her husband will administer; he has done this before.  Brad with [facility] aware.  He is attempting to arrange teaching at bedside prior to discharge.                  Action/Plan:   Expected Discharge Date:                  Expected Discharge Plan:  Home/Self Care  In-House Referral:     Discharge planning Services  CM Consult  Post Acute Care Choice:    Choice offered to:     DME Arranged:    DME Agency:     HH Arranged:  IV Antibiotics HH Agency:  [place]  Status of Service:  Completed, signed off  If discussed at [place] of Stay Meetings, dates discussed:    Additional Comments:  Elza Rafter, RN 10/09/2018, 9:33 AM

## 2018-10-09 NOTE — Telephone Encounter (Signed)
OK to schedule on Tuesday 1/14 at 9:00 am or on Tuesday 1/21 at 9:00 am with Dr. Caryl Comes.

## 2018-10-09 NOTE — Progress Notes (Signed)
RN discussed  PT prior to discharge with MD. MD states pt will discharge with HH/PT. I will continue to assess.

## 2018-10-09 NOTE — Discharge Summary (Signed)
[facility] - [place] at [facility]   PATIENT NAME: [name]    MR#:  [id number]  DATE OF BIRTH:  03/18/1956  DATE OF ADMISSION:  09/28/2018 ADMITTING PHYSICIAN: Campbell Stall, MD  DATE OF DISCHARGE: No discharge date for patient encounter.  PRIMARY CARE PHYSICIAN: Myles Lipps, MD    ADMISSION DIAGNOSIS:  Infectious arthropathy [facility]) [M00.9] Respiratory failure (HCC) [J96.90] Abscess or cellulitis of toe, left [L03.032, L02.612] Altered mental status, unspecified altered mental status type [R41.82] Sepsis, due to unspecified organism, unspecified whether acute organ dysfunction present (HCC) [A41.9] Sepsis (HCC) [A41.9]  DISCHARGE DIAGNOSIS:  Active Problems:   Sepsis (HCC)   SECONDARY DIAGNOSIS:   Past Medical History:  Diagnosis Date  . Acute myocardial infarction, subendocardial infarction, initial episode of care (HCC) 07/21/2012  . Acute osteomyelitis involving ankle and foot (HCC) 02/06/2015  . Acute systolic heart failure (HCC) 07/21/2012   New onset 07/19/12; admission to [facility] ED. Elevated Troponins.  S/p 2D-echo with EF 20-25%.  S/p cardiac catheterization with stenting LAD.  Repeat 2D-echo 10/2011 with improved EF of 35%.   . Anemia   . Automatic implantable cardioverter-defibrillator in situ    a. MDT CRT-D 06/2014, SN: [id number]    . CAD (coronary artery disease)    a. cardiac cath 101/04/2012: PCI/DES to chronically occluded mLAD, consideration PCI to diag branch in 4 weeks.   . Cataract   . Chicken pox   . Chronic systolic CHF (congestive heart failure) (HCC)    a. mixed ICM & NICM; b. EF 20-25% by echo 07/2012, mid-dist 2/3 of LV sev HK/AK, mild MR. echo 10/2012: EF 30-35%, sev HK ant-septal & inf walls, GR1DD, mild MR, PASP 33. c. echo 02/2013: EF 30%, GR1DD, mild MR. echo 04/2014: EF 30%, Septal-lat dyssynchrony, global HK, inf AK, GR1DD, mild MR. d. echo 10/2014: EF50-55%, WM nl, GR1DD, septal mild paradox. e. echo  02/2015: EF 50-55%, wm   . Depression   . Heart attack (HCC)   . Heel ulcer (HCC) 04/27/2015  . History of blood transfusion ~ 2011   "plasma; had neuropathy; couldn't walk"  . Hypertension   . LBBB (left bundle branch block)   . Neuromuscular disorder (HCC)   . Neuropathy 2011  . Obesity, unspecified   . OSA on CPAP    Moderate with AHI 23/hr and now on CPAP at 16cm H2O.  Her DME is [facility]  . Pure hypercholesterolemia   . Type II diabetes mellitus (HCC)   . Unspecified vitamin D deficiency     HOSPITAL COURSE:  63 year old female with past medical history significant for CAD status post PCI, chronic systolic CHF status post ICD, ischemic cardiomyopathy, diabetes, hypertension, obesity, sleep apnea on CPAP admitted for MSSA bacteremia and left foot second toe ulceration.  *Acute MSSA bacteremia Secondary to toe infection Resolving  Continue IV Ancef-6 weeks of treatment per infectious disease-through November 10, 2018, s/p PICC line placement while in house later today Repeat blood cultures from 09/29/2018 were negative, infectious disease did see patient while in house, TEE done on 10/02/2018-negative for vegetations/ICD wires do not look infected, s/p left second MTP this morning by Dr. Faythe Ghee, s/p left LE angiogram with stent placement/angioplasty performed by vascular surgery/Dr. Gilda Crease, outpatient follow-up appointment scheduled prior to discharge  *Acute NSTEMI stable Hx chronic LAD occlusive disease and stent in 2013 Medical management per cardiology, Plavix held temporarily for toe amputation, continue DAPT, statin therapy, losartan cardiac catheterization on 09/18/2018 that shown chronically  occluded LAD which is a narrow vessel Chronic LAD occlusion further can be addressed as outpatient per cardiology  *Acute left foot second toe infection Secondary to MSSA Status post second left MTP by podiatry/Dr. Orland Jarred October 08, 2018  Amputation needed due to presence of  ICD  *Acute renal failure on CKD Avoided nephrotoxic agents  *Chronic systolic CHF-euvolemi without exacerbation Continued losartan, aspirin, Coreg, statin, Entresto as outpatient if blood pressure permits  *Chronic diabetes mellitus Stable on current regiment  *Tachycardia Resolved s/p pacemaker placement Continued amiodarone, cardiology did see patient while in house   DVT prophylaxis-teds and SCDs had to stop the heparin due to drop in platelets.  Heparin-induced antibody assays pending.  Level of antibodies is within normal limits though, less likely to be HITT, could have been sepsis /acute illness induced thrombocytopenia  Physical therapy did see patient while in house-we will have home health PT status post discharge for continued care DISCHARGE CONDITIONS:   stable  CONSULTS OBTAINED:  Treatment Team:  Iran Ouch, MD Schnier, Latina Craver, MD  DRUG ALLERGIES:  No Known Allergies  DISCHARGE MEDICATIONS:   Allergies as of 10/09/2018   No Known Allergies     Medication List    STOP taking these medications   Dulaglutide 0.75 MG/0.5ML Sopn Commonly known as:  TRULICITY   gabapentin 600 MG tablet Commonly known as:  NEURONTIN   Insulin Glargine 100 UNIT/ML Solostar Pen Commonly known as:  LANTUS Replaced by:  insulin glargine 100 UNIT/ML injection   traMADol 50 MG tablet Commonly known as:  ULTRAM     TAKE these medications   AMBULATORY NON FORMULARY MEDICATION 1 Units by Other route once. Medication Name: foot brace   amiodarone 200 MG tablet Commonly known as:  PACERONE Take 1 tablet (200 mg total) by mouth 2 (two) times daily.   aspirin 81 MG EC tablet Take 1 tablet (81 mg total) by mouth daily. Start taking on:  October 10, 2018   CALCIUM 600 + D PO Take 1 tablet by mouth 2 (two) times daily.   carvedilol 12.5 MG tablet Commonly known as:  COREG Take 1 tablet (12.5 mg total) by mouth 2 (two) times daily with a meal.    ceFAZolin  IVPB Commonly known as:  ANCEF Inject 2 g into the vein every 8 (eight) hours. Indication: MSSA bacteremia and toe infection Last Day of Therapy:  11/10/2018 Labs - Once weekly:  CBC/D and BMP, Labs - Every other week:  ESR and CRP Start taking on:  October 10, 2018   ceFAZolin 2-4 GM/100ML-% IVPB Commonly known as:  ANCEF Inject 100 mLs (2 g total) into the vein every 8 (eight) hours.   citalopram 20 MG tablet Commonly known as:  CELEXA Take 1 tablet (20 mg total) by mouth daily.   clopidogrel 75 MG tablet Commonly known as:  PLAVIX Take 1 tablet (75 mg total) by mouth daily. Start taking on:  October 10, 2018 What changed:  when to take this   ferrous sulfate 325 (65 FE) MG tablet Take 325 mg by mouth daily.   glimepiride 4 MG tablet Commonly known as:  AMARYL Take 1 tablet (4 mg total) by mouth 2 (two) times daily with a meal.   glucose blood test strip Commonly known as:  ONE TOUCH ULTRA TEST Check sugar three times daily dx: DMII uncontrolled insulin dependent with complications   HYDROcodone-acetaminophen 5-325 MG tablet Commonly known as:  NORCO/VICODIN Take 1-2 tablets by mouth every 6 (  six) hours as needed for severe pain.   insulin glargine 100 UNIT/ML injection Commonly known as:  LANTUS Inject 0.2 mLs (20 Units total) into the skin daily. Start taking on:  October 10, 2018 Replaces:  Insulin Glargine 100 UNIT/ML Solostar Pen   Insulin Pen Needle 31G X 8 MM Misc Commonly known as:  B-D ULTRAFINE III SHORT PEN USE AS DIRECTED WITH LANTUS; dx: DMII uncontrolled, insulin dependent, with complications   lidocaine 5 % Commonly known as:  LIDODERM Place 1 patch onto the skin daily. Remove & Discard patch within 12 hours or as directed by MD   losartan 25 MG tablet Commonly known as:  COZAAR Take 1 tablet (25 mg total) by mouth daily. Start taking on:  October 10, 2018 What changed:  when to take this   omega-3 acid ethyl esters 1 g  capsule Commonly known as:  LOVAZA Take 1 g by mouth 2 (two) times daily.   protein supplement shake Liqd Commonly known as:  PREMIER PROTEIN Take 325 mLs (11 oz total) by mouth 2 (two) times daily between meals.   rosuvastatin 40 MG tablet Commonly known as:  CRESTOR TAKE 1 TABLET BY MOUTH ONCE DAILY   torsemide 20 MG tablet Commonly known as:  DEMADEX Take 1 tablet (20 mg total) by mouth daily. What changed:  when to take this            Home Infusion Instuctions  (From admission, onward)         Start     Ordered   10/09/18 0000  Home infusion instructions [facility] May follow [facility] Dosing Protocol; May administer Cathflo as needed to maintain patency of vascular access device.; Flushing of vascular access device: per [facility] Protocol: 0.9% NaCl pre/post medica...    Question Answer Comment  Instructions May follow [facility] Dosing Protocol   Instructions May administer Cathflo as needed to maintain patency of vascular access device.   Instructions Flushing of vascular access device: per [facility] Protocol: 0.9% NaCl pre/post medication administration and prn patency; Heparin 100 u/ml, 5ml for implanted ports and Heparin 10u/ml, 5ml for all other central venous catheters.   Instructions May follow [facility] Anaphylaxis Protocol for First Dose Administration in the home: 0.9% NaCl at 25-50 ml/hr to maintain IV access for protocol meds. Epinephrine 0.3 ml IV/IM PRN and Benadryl 25-50 IV/IM PRN s/s of anaphylaxis.   Instructions [facility] Infusion Coordinator (RN) to assist per patient IV care needs in the home PRN.      10/09/18 1122           DISCHARGE INSTRUCTIONS:    Clavicle observation if you experience worsening of your admission symptoms, develop shortness of breath, life threatening emergency, suicidal or homicidal thoughts you must seek medical attention immediately by calling 911 or calling your MD immediately  if symptoms less severe.  You Must  read complete instructions/literature along with all the possible adverse reactions/side effects for all the Medicines you take and that have been prescribed to you. Take any new Medicines after you have completely understood and accept all the possible adverse reactions/side effects.   Please note  You were cared for by a hospitalist during your hospital stay. If you have any questions about your discharge medications or the care you received while you were in the hospital after you are discharged, you can call the unit and asked to speak with the hospitalist on call if the hospitalist that took care of you  is not available. Once you are discharged, your primary care physician will handle any further medical issues. Please note that NO REFILLS for any discharge medications will be authorized once you are discharged, as it is imperative that you return to your primary care physician (or establish a relationship with a primary care physician if you do not have one) for your aftercare needs so that they can reassess your need for medications and monitor your lab values.    Today   CHIEF COMPLAINT:   Chief Complaint  Patient presents with  . Code Sepsis    HISTORY OF PRESENT ILLNESS:   [name]  is a 63 y.o. female with a known history of coronary artery disease, diabetes mellitus, status post pacemaker defibrillation and multiple other medical problems is brought into the ED with altered mental status.  Patient has fever associated with nausea vomiting and diarrhea for the past 24 hours according to the daughter at bedside.  Temperature was 102 F and white blood cell count is elevated and the troponins are elevated History is obtained from the ER doctor, staff records and patient's daughter at bedside.   VITAL SIGNS:  Blood pressure (!) 138/53, pulse 62, temperature 97.9 F (36.6 C), temperature source Oral, resp. rate 20, height 5\' 7"  (1.702 m), weight 134.2 kg, SpO2 95 %.  I/O:     Intake/Output Summary (Last 24 hours) at 10/09/2018 1123 Last data filed at 10/09/2018 0500 Gross per 24 hour  Intake 5.72 ml  Output 350 ml  Net -344.28 ml    PHYSICAL EXAMINATION:  GENERAL:  63 y.o.-year-old patient lying in the bed with no acute distress.  EYES: Pupils equal, round, reactive to light and accommodation. No scleral icterus. Extraocular muscles intact.  HEENT: Head atraumatic, normocephalic. Oropharynx and nasopharynx clear.  NECK:  Supple, no jugular venous distention. No thyroid enlargement, no tenderness.  LUNGS: Normal breath sounds bilaterally, no wheezing, rales,rhonchi or crepitation. No use of accessory muscles of respiration.  CARDIOVASCULAR: S1, S2 normal. No murmurs, rubs, or gallops.  ABDOMEN: Soft, non-tender, non-distended. Bowel sounds present. No organomegaly or mass.  EXTREMITIES: No pedal edema, cyanosis, or clubbing.  NEUROLOGIC: Cranial nerves II through XII are intact. Muscle strength 5/5 in all extremities. Sensation intact. Gait not checked.  PSYCHIATRIC: The patient is alert and oriented x 3.  SKIN: No obvious rash, lesion, or ulcer.   DATA REVIEW:   CBC Recent Labs  Lab 10/07/18 0351  WBC 13.3*  HGB 9.8*  HCT 30.2*  PLT 187    Chemistries  Recent Labs  Lab 10/02/18 1513  10/07/18 0351  NA  --    < > 131*  K  --    < > 3.7  CL  --    < > 99  CO2  --    < > 23  GLUCOSE  --    < > 221*  BUN  --    < > 24*  CREATININE  --    < > 1.46*  CALCIUM  --    < > 7.6*  MG 1.8  --   --    < > = values in this interval not displayed.    Cardiac Enzymes No results for input(s): TROPONINI in the last 168 hours.  Microbiology Results  Results for orders placed or performed during the hospital encounter of 09/28/18  Blood Culture (routine x 2)     Status: Abnormal   Collection Time: 09/28/18 12:02 PM  Result Value Ref  Range Status   Specimen Description   Final    BLOOD RIGHT ANTECUBITAL Performed at [facility], [address] [place], [place] [address]    Special Requests   Final    BOTTLES DRAWN AEROBIC AND ANAEROBIC Blood Culture results may not be optimal due to an excessive volume of blood received in culture bottles Performed at [facility], [address] [place], [place] [address]    Culture  Setup Time   Final    GRAM POSITIVE COCCI IN BOTH AEROBIC AND ANAEROBIC BOTTLES CRITICAL RESULT CALLED TO, READ BACK BY AND VERIFIED WITH: JASON ROBBINS @2134  09/28/18 AKT Performed at [facility], [address], [place], [place] [address]    Culture STAPHYLOCOCCUS AUREUS (A)  Final   Report Status 10/01/2018 FINAL  Final   Organism ID, Bacteria STAPHYLOCOCCUS AUREUS  Final      Susceptibility   Staphylococcus aureus - MIC*    CIPROFLOXACIN <=0.5 SENSITIVE Sensitive     ERYTHROMYCIN <=0.25 SENSITIVE Sensitive     GENTAMICIN <=0.5 SENSITIVE Sensitive     OXACILLIN 0.5 SENSITIVE Sensitive     TETRACYCLINE <=1 SENSITIVE Sensitive     VANCOMYCIN 1 SENSITIVE Sensitive     TRIMETH/SULFA <=10 SENSITIVE Sensitive     CLINDAMYCIN <=0.25 SENSITIVE Sensitive     RIFAMPIN <=0.5 SENSITIVE Sensitive     Inducible Clindamycin NEGATIVE Sensitive     * STAPHYLOCOCCUS AUREUS  Urine culture     Status: Abnormal   Collection Time: 09/28/18 12:02 PM  Result Value Ref Range Status   Specimen Description   Final    URINE, RANDOM Performed at [facility], [address], [place], [place] [address]    Special Requests   Final    NONE Performed at [facility], [address] [place], [place] [address]    Culture >=100,000 COLONIES/mL ESCHERICHIA COLI (A)  Final   Report Status 09/30/2018 FINAL  Final   Organism ID, Bacteria ESCHERICHIA COLI (A)  Final      Susceptibility   Escherichia coli - MIC*    AMPICILLIN 8 SENSITIVE Sensitive     CEFAZOLIN <=4 SENSITIVE Sensitive     CEFTRIAXONE <=1 SENSITIVE Sensitive     CIPROFLOXACIN <=0.25 SENSITIVE Sensitive     GENTAMICIN  <=1 SENSITIVE Sensitive     IMIPENEM <=0.25 SENSITIVE Sensitive     NITROFURANTOIN <=16 SENSITIVE Sensitive     TRIMETH/SULFA <=20 SENSITIVE Sensitive     AMPICILLIN/SULBACTAM <=2 SENSITIVE Sensitive     PIP/TAZO <=4 SENSITIVE Sensitive     Extended ESBL NEGATIVE Sensitive     * >=100,000 COLONIES/mL ESCHERICHIA COLI  Blood Culture ID Panel (Reflexed)     Status: Abnormal   Collection Time: 09/28/18 12:02 PM  Result Value Ref Range Status   Enterococcus species NOT DETECTED NOT DETECTED Final   Listeria monocytogenes NOT DETECTED NOT DETECTED Final   Staphylococcus species DETECTED (A) NOT DETECTED Final    Comment: CRITICAL RESULT CALLED TO, READ BACK BY AND VERIFIED WITH: JASON ROBBINS @2134  09/28/18 AKT    Staphylococcus aureus (BCID) DETECTED (A) NOT DETECTED Final    Comment: Methicillin (oxacillin) susceptible Staphylococcus aureus (MSSA). Preferred therapy is anti staphylococcal beta lactam antibiotic (Cefazolin or Nafcillin), unless clinically contraindicated. CRITICAL RESULT CALLED TO, READ BACK BY AND VERIFIED WITH: JASON ROBBINS @2134  09/28/18 AKT    Methicillin resistance NOT DETECTED NOT DETECTED Final   Streptococcus species NOT DETECTED NOT DETECTED Final   Streptococcus agalactiae  NOT DETECTED NOT DETECTED Final   Streptococcus pneumoniae NOT DETECTED NOT DETECTED Final   Streptococcus pyogenes NOT DETECTED NOT DETECTED Final   Acinetobacter baumannii NOT DETECTED NOT DETECTED Final   Enterobacteriaceae species NOT DETECTED NOT DETECTED Final   Enterobacter cloacae complex NOT DETECTED NOT DETECTED Final   Escherichia coli NOT DETECTED NOT DETECTED Final   Klebsiella oxytoca NOT DETECTED NOT DETECTED Final   Klebsiella pneumoniae NOT DETECTED NOT DETECTED Final   Proteus species NOT DETECTED NOT DETECTED Final   Serratia marcescens NOT DETECTED NOT DETECTED Final   Haemophilus influenzae NOT DETECTED NOT DETECTED Final   Neisseria meningitidis NOT DETECTED NOT  DETECTED Final   Pseudomonas aeruginosa NOT DETECTED NOT DETECTED Final   Candida albicans NOT DETECTED NOT DETECTED Final   Candida glabrata NOT DETECTED NOT DETECTED Final   Candida krusei NOT DETECTED NOT DETECTED Final   Candida parapsilosis NOT DETECTED NOT DETECTED Final   Candida tropicalis NOT DETECTED NOT DETECTED Final    Comment: Performed at [facility], [address] [place], [place] [address]  Blood Culture (routine x 2)     Status: Abnormal   Collection Time: 09/28/18 12:14 PM  Result Value Ref Range Status   Specimen Description   Final    BLOOD RIGHT ANTECUBITAL Performed at [facility], [address] [place], [place] [address]    Special Requests   Final    BOTTLES DRAWN AEROBIC AND ANAEROBIC Blood Culture results may not be optimal due to an excessive volume of blood received in culture bottles Performed at [facility], [address] [place], [place] [address]    Culture  Setup Time   Final    IN BOTH AEROBIC AND ANAEROBIC BOTTLES GRAM POSITIVE COCCI CRITICAL VALUE NOTED.  VALUE IS CONSISTENT WITH PREVIOUSLY REPORTED AND CALLED VALUE.    Culture (A)  Final    STAPHYLOCOCCUS AUREUS SUSCEPTIBILITIES PERFORMED ON PREVIOUS CULTURE WITHIN THE LAST 5 DAYS. Performed at [facility], [address], [place], [place] [address]    Report Status 10/01/2018 FINAL  Final  MRSA PCR Screening     Status: None   Collection Time: 09/28/18  5:35 PM  Result Value Ref Range Status   MRSA by PCR NEGATIVE NEGATIVE Final    Comment:        The GeneXpert MRSA Assay (FDA approved for NASAL specimens only), is one component of a comprehensive MRSA colonization surveillance program. It is not intended to diagnose MRSA infection nor to guide or monitor treatment for MRSA infections. Performed at [facility], [address] [place], [place] [address]   Gastrointestinal Panel by PCR , Stool     Status: None   Collection  Time: 09/29/18 11:11 AM  Result Value Ref Range Status   Campylobacter species NOT DETECTED NOT DETECTED Final   Plesimonas shigelloides NOT DETECTED NOT DETECTED Final   Salmonella species NOT DETECTED NOT DETECTED Final   Yersinia enterocolitica NOT DETECTED NOT DETECTED Final   Vibrio species NOT DETECTED NOT DETECTED Final   Vibrio cholerae NOT DETECTED NOT DETECTED Final   Enteroaggregative E coli (EAEC) NOT DETECTED NOT DETECTED Final   Enteropathogenic E coli (EPEC) NOT DETECTED NOT DETECTED Final   Enterotoxigenic E coli (ETEC) NOT DETECTED NOT DETECTED Final   Shiga like toxin producing E coli (STEC) NOT DETECTED NOT DETECTED Final   Shigella/Enteroinvasive E coli (EIEC) NOT DETECTED NOT DETECTED Final   Cryptosporidium NOT DETECTED NOT DETECTED Final  Cyclospora cayetanensis NOT DETECTED NOT DETECTED Final   Entamoeba histolytica NOT DETECTED NOT DETECTED Final   Giardia lamblia NOT DETECTED NOT DETECTED Final   Adenovirus F40/41 NOT DETECTED NOT DETECTED Final   Astrovirus NOT DETECTED NOT DETECTED Final   Norovirus GI/GII NOT DETECTED NOT DETECTED Final   Rotavirus A NOT DETECTED NOT DETECTED Final   Sapovirus (I, II, IV, and V) NOT DETECTED NOT DETECTED Final    Comment: Performed at [facility], [address] [place], [place] [address]  C difficile quick scan w PCR reflex     Status: None   Collection Time: 09/29/18 11:11 AM  Result Value Ref Range Status   C Diff antigen NEGATIVE NEGATIVE Final   C Diff toxin NEGATIVE NEGATIVE Final   C Diff interpretation No C. difficile detected.  Final    Comment: Performed at [facility], [address] [place], [place] [address]  Culture, blood (Routine X 2) w Reflex to ID Panel     Status: None   Collection Time: 09/29/18  9:24 PM  Result Value Ref Range Status   Specimen Description BLOOD BLOOD LEFT HAND  Final   Special Requests   Final    BOTTLES DRAWN AEROBIC AND ANAEROBIC Blood Culture results may  not be optimal due to an inadequate volume of blood received in culture bottles   Culture   Final    NO GROWTH 5 DAYS Performed at [facility], [address] [place], [place] [address]    Report Status 10/04/2018 FINAL  Final  Culture, blood (Routine X 2) w Reflex to ID Panel     Status: None   Collection Time: 09/29/18  9:25 PM  Result Value Ref Range Status   Specimen Description BLOOD BLOOD RIGHT HAND  Final   Special Requests   Final    BOTTLES DRAWN AEROBIC AND ANAEROBIC Blood Culture adequate volume   Culture   Final    NO GROWTH 5 DAYS Performed at [facility], [address] [place], [place] [address]    Report Status 10/04/2018 FINAL  Final  Aerobic/Anaerobic Culture (surgical/deep wound)     Status: None   Collection Time: 09/30/18 10:49 AM  Result Value Ref Range Status   Specimen Description WOUND LEFT TOE 2ND  Final   Special Requests NONE  Final   Gram Stain   Final    RARE WBC PRESENT, PREDOMINANTLY PMN FEW GRAM POSITIVE COCCI IN PAIRS    Culture   Final    MODERATE STAPHYLOCOCCUS AUREUS NO ANAEROBES ISOLATED Performed at [facility], [address], [place], [place] [address]    Report Status 10/05/2018 FINAL  Final   Organism ID, Bacteria STAPHYLOCOCCUS AUREUS  Final      Susceptibility   Staphylococcus aureus - MIC*    CIPROFLOXACIN <=0.5 SENSITIVE Sensitive     ERYTHROMYCIN <=0.25 SENSITIVE Sensitive     GENTAMICIN <=0.5 SENSITIVE Sensitive     OXACILLIN 0.5 SENSITIVE Sensitive     TETRACYCLINE <=1 SENSITIVE Sensitive     VANCOMYCIN <=0.5 SENSITIVE Sensitive     TRIMETH/SULFA <=10 SENSITIVE Sensitive     CLINDAMYCIN <=0.25 SENSITIVE Sensitive     RIFAMPIN <=0.5 SENSITIVE Sensitive     Inducible Clindamycin NEGATIVE Sensitive     * MODERATE STAPHYLOCOCCUS AUREUS  Aerobic/Anaerobic Culture (surgical/deep wound)     Status: None (Preliminary result)   Collection Time: 10/08/18  8:04 AM  Result Value Ref Range Status  Specimen Description   Final    FOOT BONE 2ND TOE LEFT FOOT Performed at [facility], [address], [place], [place] [address]    Special Requests   Final    NONE Performed at [facility], [address] [place], [place] [address]    Gram Stain   Final    RARE WBC PRESENT, PREDOMINANTLY PMN NO ORGANISMS SEEN Performed at [facility], [address], [place], [place] [address]    Culture CULTURE REINCUBATED FOR BETTER GROWTH  Final   Report Status PENDING  Incomplete    RADIOLOGY:  [place] Ekg Site Rite  Result Date: 10/08/2018 If Site Rite image not attached, placement could not be confirmed due to current cardiac rhythm.   EKG:   Orders placed or performed during the hospital encounter of 09/28/18  . ED EKG 12-Lead  . ED EKG 12-Lead  . EKG 12-Lead  . EKG 12-Lead  . EKG 12-Lead  . EKG 12-Lead  . EKG 12-Lead  . EKG 12-Lead  . EKG 12-Lead  . EKG 12-Lead  . EKG 12-Lead  . EKG 12-Lead  . EKG 12-Lead  . EKG 12-Lead  . EKG 12-Lead  . EKG 12-Lead  . EKG 12-Lead  . EKG 12-Lead  . EKG 12-Lead  . EKG 12-Lead  . EKG 12-Lead  . EKG 12-Lead  . EKG 12-Lead  . EKG 12-Lead      Management plans discussed with the patient, family and they are in agreement.  CODE STATUS:     Code Status Orders  (From admission, onward)         Start     Ordered   09/28/18 1453  Full code  Continuous     09/28/18 1455        Code Status History    Date Active Date Inactive Code Status Order ID Comments User Context   11/11/2015 1411 11/12/2015 1635 Full Code [id number]  Altamese Dilling, MD Inpatient   06/17/2015 0148 06/19/2015 1659 Full Code [id number]  Arnaldo Natal, MD Inpatient   02/17/2015 1330 02/24/2015 1925 DNR [id number]  Adrian Saran, MD ED   07/06/2014 1100 07/07/2014 1524 Full Code [id number]  Duke Salvia, MD Inpatient   07/22/2012 1427 07/26/2012 1507 DNR [id number]  Briscoe Deutscher, DO Inpatient      TOTAL TIME TAKING CARE OF THIS PATIENT:  40 minutes.    Evelena Asa Amaryah Mallen M.D on 10/09/2018 at 11:23 AM  Between 7am to 6pm - Pager - [phone]  After 6pm go to [email or web address] - password [email or web address]  [facility]  Office  [phone]  CC: Primary care physician; Myles Lipps, MD   Note: This dictation was prepared with Dragon dictation along with smaller phrase technology. Any transcriptional errors that result from this process are unintentional.

## 2018-10-09 NOTE — Progress Notes (Signed)
Pt returns from procedure, VSS, complaints of chronic back pain which will be treated with prn medication. Bandage to left foot is clean, dry and intact. I will continue to assess.

## 2018-10-09 NOTE — Telephone Encounter (Signed)
Copied from [place] [id number]. Topic: Appointment Scheduling - Scheduling Inquiry for Clinic >> Oct 09, 2018 11:50 AM Burchel, Abbi R wrote: Reason for CRM: Per Felicia  Please schedule Pt for hosp f/up within 5 days with Dr Pamella Pert  Per Wilfred Curtis - we can use a sameday on 10/13/18 with Dr. Pamella Pert. LVM for pt.

## 2018-10-10 DIAGNOSIS — M86172 Other acute osteomyelitis, left ankle and foot: Secondary | ICD-10-CM | POA: Diagnosis not present

## 2018-10-10 DIAGNOSIS — Z95820 Peripheral vascular angioplasty status with implants and grafts: Secondary | ICD-10-CM | POA: Diagnosis not present

## 2018-10-10 DIAGNOSIS — I255 Ischemic cardiomyopathy: Secondary | ICD-10-CM | POA: Diagnosis not present

## 2018-10-10 DIAGNOSIS — E1122 Type 2 diabetes mellitus with diabetic chronic kidney disease: Secondary | ICD-10-CM | POA: Diagnosis not present

## 2018-10-10 DIAGNOSIS — E559 Vitamin D deficiency, unspecified: Secondary | ICD-10-CM | POA: Diagnosis not present

## 2018-10-10 DIAGNOSIS — Z95810 Presence of automatic (implantable) cardiac defibrillator: Secondary | ICD-10-CM | POA: Diagnosis not present

## 2018-10-10 DIAGNOSIS — J181 Lobar pneumonia, unspecified organism: Secondary | ICD-10-CM | POA: Diagnosis not present

## 2018-10-10 DIAGNOSIS — A4101 Sepsis due to Methicillin susceptible Staphylococcus aureus: Secondary | ICD-10-CM | POA: Diagnosis not present

## 2018-10-10 DIAGNOSIS — I252 Old myocardial infarction: Secondary | ICD-10-CM | POA: Diagnosis not present

## 2018-10-10 DIAGNOSIS — Z89421 Acquired absence of other right toe(s): Secondary | ICD-10-CM | POA: Diagnosis not present

## 2018-10-10 DIAGNOSIS — N189 Chronic kidney disease, unspecified: Secondary | ICD-10-CM | POA: Diagnosis not present

## 2018-10-10 DIAGNOSIS — Z89422 Acquired absence of other left toe(s): Secondary | ICD-10-CM | POA: Diagnosis not present

## 2018-10-10 DIAGNOSIS — Z4781 Encounter for orthopedic aftercare following surgical amputation: Secondary | ICD-10-CM | POA: Diagnosis not present

## 2018-10-10 DIAGNOSIS — I130 Hypertensive heart and chronic kidney disease with heart failure and stage 1 through stage 4 chronic kidney disease, or unspecified chronic kidney disease: Secondary | ICD-10-CM | POA: Diagnosis not present

## 2018-10-10 DIAGNOSIS — B9561 Methicillin susceptible Staphylococcus aureus infection as the cause of diseases classified elsewhere: Secondary | ICD-10-CM | POA: Diagnosis not present

## 2018-10-10 DIAGNOSIS — F329 Major depressive disorder, single episode, unspecified: Secondary | ICD-10-CM | POA: Diagnosis not present

## 2018-10-10 DIAGNOSIS — I2510 Atherosclerotic heart disease of native coronary artery without angina pectoris: Secondary | ICD-10-CM | POA: Diagnosis not present

## 2018-10-10 DIAGNOSIS — Z452 Encounter for adjustment and management of vascular access device: Secondary | ICD-10-CM | POA: Diagnosis not present

## 2018-10-10 DIAGNOSIS — G4733 Obstructive sleep apnea (adult) (pediatric): Secondary | ICD-10-CM | POA: Diagnosis not present

## 2018-10-10 DIAGNOSIS — Z955 Presence of coronary angioplasty implant and graft: Secondary | ICD-10-CM | POA: Diagnosis not present

## 2018-10-10 DIAGNOSIS — I447 Left bundle-branch block, unspecified: Secondary | ICD-10-CM | POA: Diagnosis not present

## 2018-10-10 DIAGNOSIS — E1140 Type 2 diabetes mellitus with diabetic neuropathy, unspecified: Secondary | ICD-10-CM | POA: Diagnosis not present

## 2018-10-10 DIAGNOSIS — I5022 Chronic systolic (congestive) heart failure: Secondary | ICD-10-CM | POA: Diagnosis not present

## 2018-10-10 DIAGNOSIS — Z8631 Personal history of diabetic foot ulcer: Secondary | ICD-10-CM | POA: Diagnosis not present

## 2018-10-10 DIAGNOSIS — E1169 Type 2 diabetes mellitus with other specified complication: Secondary | ICD-10-CM | POA: Diagnosis not present

## 2018-10-12 ENCOUNTER — Telehealth: Payer: Self-pay | Admitting: Family Medicine

## 2018-10-12 ENCOUNTER — Ambulatory Visit: Payer: Self-pay | Admitting: *Deleted

## 2018-10-12 ENCOUNTER — Other Ambulatory Visit: Payer: Self-pay

## 2018-10-12 ENCOUNTER — Encounter: Payer: Self-pay | Admitting: Intensive Care

## 2018-10-12 ENCOUNTER — Emergency Department: Payer: PPO

## 2018-10-12 ENCOUNTER — Inpatient Hospital Stay
Admission: EM | Admit: 2018-10-12 | Discharge: 2018-10-16 | DRG: 193 | Disposition: A | Discharge status: Home Health | Payer: PPO | Attending: Internal Medicine | Admitting: Internal Medicine

## 2018-10-12 DIAGNOSIS — J9601 Acute respiratory failure with hypoxia: Secondary | ICD-10-CM | POA: Diagnosis present

## 2018-10-12 DIAGNOSIS — Z79899 Other long term (current) drug therapy: Secondary | ICD-10-CM | POA: Diagnosis not present

## 2018-10-12 DIAGNOSIS — N179 Acute kidney failure, unspecified: Secondary | ICD-10-CM | POA: Diagnosis present

## 2018-10-12 DIAGNOSIS — I130 Hypertensive heart and chronic kidney disease with heart failure and stage 1 through stage 4 chronic kidney disease, or unspecified chronic kidney disease: Secondary | ICD-10-CM | POA: Diagnosis not present

## 2018-10-12 DIAGNOSIS — Z955 Presence of coronary angioplasty implant and graft: Secondary | ICD-10-CM | POA: Diagnosis not present

## 2018-10-12 DIAGNOSIS — Z7902 Long term (current) use of antithrombotics/antiplatelets: Secondary | ICD-10-CM | POA: Diagnosis not present

## 2018-10-12 DIAGNOSIS — R0902 Hypoxemia: Secondary | ICD-10-CM

## 2018-10-12 DIAGNOSIS — I252 Old myocardial infarction: Secondary | ICD-10-CM | POA: Diagnosis not present

## 2018-10-12 DIAGNOSIS — Z794 Long term (current) use of insulin: Secondary | ICD-10-CM | POA: Diagnosis not present

## 2018-10-12 DIAGNOSIS — Y95 Nosocomial condition: Secondary | ICD-10-CM | POA: Diagnosis present

## 2018-10-12 DIAGNOSIS — Z6841 Body Mass Index (BMI) 40.0 and over, adult: Secondary | ICD-10-CM | POA: Diagnosis not present

## 2018-10-12 DIAGNOSIS — I5022 Chronic systolic (congestive) heart failure: Secondary | ICD-10-CM | POA: Diagnosis present

## 2018-10-12 DIAGNOSIS — E1122 Type 2 diabetes mellitus with diabetic chronic kidney disease: Secondary | ICD-10-CM | POA: Diagnosis present

## 2018-10-12 DIAGNOSIS — J181 Lobar pneumonia, unspecified organism: Secondary | ICD-10-CM | POA: Diagnosis not present

## 2018-10-12 DIAGNOSIS — N183 Chronic kidney disease, stage 3 (moderate): Secondary | ICD-10-CM | POA: Diagnosis not present

## 2018-10-12 DIAGNOSIS — I447 Left bundle-branch block, unspecified: Secondary | ICD-10-CM | POA: Diagnosis not present

## 2018-10-12 DIAGNOSIS — E669 Obesity, unspecified: Secondary | ICD-10-CM | POA: Diagnosis not present

## 2018-10-12 DIAGNOSIS — Z7189 Other specified counseling: Secondary | ICD-10-CM | POA: Diagnosis not present

## 2018-10-12 DIAGNOSIS — I2510 Atherosclerotic heart disease of native coronary artery without angina pectoris: Secondary | ICD-10-CM | POA: Diagnosis not present

## 2018-10-12 DIAGNOSIS — M86172 Other acute osteomyelitis, left ankle and foot: Secondary | ICD-10-CM | POA: Diagnosis not present

## 2018-10-12 DIAGNOSIS — Z515 Encounter for palliative care: Secondary | ICD-10-CM | POA: Diagnosis not present

## 2018-10-12 DIAGNOSIS — Z95810 Presence of automatic (implantable) cardiac defibrillator: Secondary | ICD-10-CM | POA: Diagnosis not present

## 2018-10-12 DIAGNOSIS — E7800 Pure hypercholesterolemia, unspecified: Secondary | ICD-10-CM | POA: Diagnosis not present

## 2018-10-12 DIAGNOSIS — Z89421 Acquired absence of other right toe(s): Secondary | ICD-10-CM

## 2018-10-12 DIAGNOSIS — Z9989 Dependence on other enabling machines and devices: Secondary | ICD-10-CM

## 2018-10-12 DIAGNOSIS — N189 Chronic kidney disease, unspecified: Secondary | ICD-10-CM | POA: Diagnosis not present

## 2018-10-12 DIAGNOSIS — Z7982 Long term (current) use of aspirin: Secondary | ICD-10-CM | POA: Diagnosis not present

## 2018-10-12 DIAGNOSIS — D638 Anemia in other chronic diseases classified elsewhere: Secondary | ICD-10-CM | POA: Diagnosis present

## 2018-10-12 DIAGNOSIS — M869 Osteomyelitis, unspecified: Secondary | ICD-10-CM | POA: Diagnosis not present

## 2018-10-12 DIAGNOSIS — Z66 Do not resuscitate: Secondary | ICD-10-CM | POA: Diagnosis not present

## 2018-10-12 DIAGNOSIS — Z89422 Acquired absence of other left toe(s): Secondary | ICD-10-CM | POA: Diagnosis not present

## 2018-10-12 DIAGNOSIS — G4733 Obstructive sleep apnea (adult) (pediatric): Secondary | ICD-10-CM | POA: Diagnosis not present

## 2018-10-12 DIAGNOSIS — E1169 Type 2 diabetes mellitus with other specified complication: Secondary | ICD-10-CM | POA: Diagnosis not present

## 2018-10-12 DIAGNOSIS — E119 Type 2 diabetes mellitus without complications: Secondary | ICD-10-CM | POA: Diagnosis not present

## 2018-10-12 DIAGNOSIS — R918 Other nonspecific abnormal finding of lung field: Secondary | ICD-10-CM | POA: Diagnosis not present

## 2018-10-12 DIAGNOSIS — J189 Pneumonia, unspecified organism: Secondary | ICD-10-CM | POA: Diagnosis not present

## 2018-10-12 DIAGNOSIS — I255 Ischemic cardiomyopathy: Secondary | ICD-10-CM | POA: Diagnosis present

## 2018-10-12 DIAGNOSIS — R0602 Shortness of breath: Secondary | ICD-10-CM | POA: Diagnosis not present

## 2018-10-12 DIAGNOSIS — R069 Unspecified abnormalities of breathing: Secondary | ICD-10-CM | POA: Diagnosis not present

## 2018-10-12 DIAGNOSIS — E1165 Type 2 diabetes mellitus with hyperglycemia: Secondary | ICD-10-CM | POA: Diagnosis not present

## 2018-10-12 DIAGNOSIS — I959 Hypotension, unspecified: Secondary | ICD-10-CM | POA: Diagnosis not present

## 2018-10-12 DIAGNOSIS — R7881 Bacteremia: Secondary | ICD-10-CM | POA: Diagnosis not present

## 2018-10-12 LAB — BASIC METABOLIC PANEL
Anion gap: 9 (ref 5–15)
BUN: 39 mg/dL — ABNORMAL HIGH (ref 8–23)
CO2: 23 mmol/L (ref 22–32)
Calcium: 8.1 mg/dL — ABNORMAL LOW (ref 8.9–10.3)
Chloride: 100 mmol/L (ref 98–111)
Creatinine, Ser: 1.98 mg/dL — ABNORMAL HIGH (ref 0.44–1.00)
GFR calc Af Amer: 30 mL/min — ABNORMAL LOW (ref >60)
GFR calc non Af Amer: 26 mL/min — ABNORMAL LOW (ref >60)
Glucose, Bld: 268 mg/dL — ABNORMAL HIGH (ref 70–99)
Potassium: 4.7 mmol/L (ref 3.5–5.1)
Sodium: 132 mmol/L — ABNORMAL LOW (ref 135–145)

## 2018-10-12 LAB — CBC
HCT: 32.4 % — ABNORMAL LOW (ref 36.0–46.0)
Hemoglobin: 10.4 g/dL — ABNORMAL LOW (ref 12.0–15.0)
MCH: 29 pg (ref 26.0–34.0)
MCHC: 32.1 g/dL (ref 30.0–36.0)
MCV: 90.3 fL (ref 80.0–100.0)
Platelets: 225 10*3/uL (ref 150–400)
RBC: 3.59 MIL/uL — AB (ref 3.87–5.11)
RDW: 13.2 % (ref 11.5–15.5)
WBC: 17.2 10*3/uL — ABNORMAL HIGH (ref 4.0–10.5)
nRBC: 0 % (ref 0.0–0.2)

## 2018-10-12 LAB — TROPONIN I
Troponin I: 0.04 ng/mL (ref <0.03)
Troponin I: 0.03 ng/mL (ref <0.03)

## 2018-10-12 MED ORDER — SODIUM CHLORIDE 0.9% FLUSH
10.0000 mL | Freq: Two times a day (BID) | INTRAVENOUS | Status: DC
  Administered 2018-10-13 – 2018-10-15 (×3): 10 mL
  Administered 2018-10-15: 40 mL
  Administered 2018-10-16: 10 mL

## 2018-10-12 MED ORDER — VANCOMYCIN HCL 10 G IV SOLR
2000.0000 mg | Freq: Once | INTRAVENOUS | Status: AC
  Administered 2018-10-13: 2000 mg via INTRAVENOUS
  Filled 2018-10-12: qty 2000

## 2018-10-12 MED ORDER — SODIUM CHLORIDE 0.9% FLUSH: 10.0000 mL | INTRAVENOUS | Status: DC | PRN

## 2018-10-12 MED ORDER — INSULIN ASPART 100 UNIT/ML ~~LOC~~ SOLN
0.0000 [IU] | Freq: Three times a day (TID) | SUBCUTANEOUS | Status: DC
  Administered 2018-10-13 (×2): 4 [IU] via SUBCUTANEOUS
  Administered 2018-10-13: 7 [IU] via SUBCUTANEOUS
  Administered 2018-10-14 (×2): 4 [IU] via SUBCUTANEOUS
  Administered 2018-10-15: 3 [IU] via SUBCUTANEOUS
  Administered 2018-10-15 – 2018-10-16 (×2): 4 [IU] via SUBCUTANEOUS
  Administered 2018-10-16: 3 [IU] via SUBCUTANEOUS
  Filled 2018-10-12 (×9): qty 1

## 2018-10-12 MED ORDER — PIPERACILLIN-TAZOBACTAM 3.375 G IVPB 30 MIN
3.3750 g | Freq: Once | INTRAVENOUS | Status: AC
  Administered 2018-10-12: 3.375 g via INTRAVENOUS
  Filled 2018-10-12: qty 50

## 2018-10-12 MED ORDER — INSULIN ASPART 100 UNIT/ML ~~LOC~~ SOLN
0.0000 [IU] | Freq: Every day | SUBCUTANEOUS | Status: DC
  Administered 2018-10-14: 2 [IU] via SUBCUTANEOUS
  Filled 2018-10-12: qty 1

## 2018-10-12 NOTE — Telephone Encounter (Signed)
  Reason for Disposition . [1] MODERATE difficulty breathing (e.g., speaks in phrases, SOB even at rest, pulse 100-120) AND [2] NEW-onset or WORSE than normal  Answer Assessment - Initial Assessment Questions 1. RESPIRATORY STATUS: "Describe your breathing?" (e.g., wheezing, shortness of breath, unable to speak, severe coughing)      Increased shortness of brath 2. ONSET: "When did this breathing problem begin?"      today 3. SEVERITY: "How bad is your breathing?" (e.g., mild, moderate, severe)    - MILD: No SOB at rest, mild SOB with walking, speaks normally in sentences, can lay down, no retractions, pulse < 100.    - MODERATE: SOB at rest, SOB with minimal exertion and prefers to sit, cannot lie down flat, speaks in phrases, mild retractions, audible wheezing, pulse 100-120.    - SEVERE: Very SOB at rest, speaks in single words, struggling to breathe, sitting hunched forward, retractions, pulse > 120      O2 sats 83-87% at rest  Protocols used: BREATHING DIFFICULTY-A-AH

## 2018-10-12 NOTE — H&P (Signed)
[place] [place] NAME: [name]    MR#:  [id number]  DATE OF BIRTH:  1956-04-13  DATE OF ADMISSION:  10/12/2018  PRIMARY CARE PHYSICIAN: Rutherford Guys, MD   REQUESTING/REFERRING PHYSICIAN:   CHIEF COMPLAINT:   Chief Complaint  Patient presents with  . Shortness of Breath    HISTORY OF PRESENT ILLNESS: [name]  is a 63 y.o. female with a known history history per below status post recent discharge after toe amputation/MSSA bacteremia/non-STEMI/on IV antibiotics with PICC line, presenting from home with O2 saturation 83% on room air, generalized weakness, fatigue, ER work-up noted for right-sided pneumonia, white count of 17,000,, patient evaluated in the emergency room, no apparent distress, resting comfortably in bed, patient now be admitted for acute hypoxic respiratory failure secondary to HCAP given recent hospital admission.  PAST MEDICAL HISTORY:   Past Medical History:  Diagnosis Date  . Acute myocardial infarction, subendocardial infarction, initial episode of care ([place]) 07/21/2012  . Acute osteomyelitis involving ankle and foot ([place]) 02/06/2015  . Acute systolic heart failure ([place]) 07/21/2012   New onset 07/19/12; admission to [facility] ED. Elevated Troponins.  S/p 2D-echo with EF 20-25%.  S/p cardiac catheterization with stenting LAD.  Repeat 2D-echo 10/2011 with improved EF of 35%.   . Anemia   . Automatic implantable cardioverter-defibrillator in situ    a. MDT CRT-D 06/2014, SN: [id number]    . CAD (coronary artery disease)    a. cardiac cath 101/04/2012: PCI/DES to chronically occluded mLAD, consideration PCI to diag branch in 4 weeks.   . Cataract   . Chicken pox   . Chronic systolic CHF (congestive heart failure) (HCC)    a. mixed ICM & NICM; b. EF 20-25% by echo 07/2012, mid-dist 2/3 of LV sev HK/AK, mild MR. echo 10/2012: EF 30-35%, sev HK ant-septal & inf walls, GR1DD, mild MR, PASP 33. c. echo 02/2013: EF  30%, GR1DD, mild MR. echo 04/2014: EF 30%, Septal-lat dyssynchrony, global HK, inf AK, GR1DD, mild MR. d. echo 10/2014: EF50-55%, WM nl, GR1DD, septal mild paradox. e. echo 02/2015: EF 50-55%, wm   . Depression   . Heart attack ([place])   . Heel ulcer ([place]) 04/27/2015  . History of blood transfusion ~ 2011   "plasma; had neuropathy; couldn't walk"  . Hypertension   . LBBB (left bundle branch block)   . Neuromuscular disorder ([place])   . Neuropathy 2011  . Obesity, unspecified   . OSA on CPAP    Moderate with AHI 23/hr and now on CPAP at 16cm H2O.  Her DME is [facility]  . Pure hypercholesterolemia   . Type II diabetes mellitus ([name])   . Unspecified vitamin D deficiency     PAST SURGICAL HISTORY:  Past Surgical History:  Procedure Laterality Date  . AMPUTATION TOE Right 06/18/2015   Procedure: AMPUTATION TOE;  Surgeon: Samara Deist, DPM;  Location: [facility] ORS;  Service: Podiatry;  Laterality: Right;  . AMPUTATION TOE Left 10/08/2018   Procedure: AMPUTATION TOE LEFT 2ND;  Surgeon: Albertine Patricia, DPM;  Location: [facility] ORS;  Service: Podiatry;  Laterality: Left;  . BACK SURGERY    . BI-VENTRICULAR IMPLANTABLE CARDIOVERTER DEFIBRILLATOR N/A 07/06/2014   Procedure: BI-VENTRICULAR IMPLANTABLE CARDIOVERTER DEFIBRILLATOR  (CRT-D);  Surgeon: Deboraha Sprang, MD;  Location: [facility] CATH LAB;  Service: Cardiovascular;  Laterality: N/A;  . BI-VENTRICULAR IMPLANTABLE CARDIOVERTER DEFIBRILLATOR  (CRT-D)  07/06/2014  . BILATERAL OOPHORECTOMY  01/2011   ovarian cyst benign  .  [place] [place] NAME: [name]    MR#:  [id number]  DATE OF BIRTH:  1956-04-13  DATE OF ADMISSION:  10/12/2018  PRIMARY CARE PHYSICIAN: Rutherford Guys, MD   REQUESTING/REFERRING PHYSICIAN:   CHIEF COMPLAINT:   Chief Complaint  Patient presents with  . Shortness of Breath    HISTORY OF PRESENT ILLNESS: [name]  is a 63 y.o. female with a known history history per below status post recent discharge after toe amputation/MSSA bacteremia/non-STEMI/on IV antibiotics with PICC line, presenting from home with O2 saturation 83% on room air, generalized weakness, fatigue, ER work-up noted for right-sided pneumonia, white count of 17,000,, patient evaluated in the emergency room, no apparent distress, resting comfortably in bed, patient now be admitted for acute hypoxic respiratory failure secondary to HCAP given recent hospital admission.  PAST MEDICAL HISTORY:   Past Medical History:  Diagnosis Date  . Acute myocardial infarction, subendocardial infarction, initial episode of care ([place]) 07/21/2012  . Acute osteomyelitis involving ankle and foot ([place]) 02/06/2015  . Acute systolic heart failure ([place]) 07/21/2012   New onset 07/19/12; admission to [facility] ED. Elevated Troponins.  S/p 2D-echo with EF 20-25%.  S/p cardiac catheterization with stenting LAD.  Repeat 2D-echo 10/2011 with improved EF of 35%.   . Anemia   . Automatic implantable cardioverter-defibrillator in situ    a. MDT CRT-D 06/2014, SN: [id number]    . CAD (coronary artery disease)    a. cardiac cath 101/04/2012: PCI/DES to chronically occluded mLAD, consideration PCI to diag branch in 4 weeks.   . Cataract   . Chicken pox   . Chronic systolic CHF (congestive heart failure) (HCC)    a. mixed ICM & NICM; b. EF 20-25% by echo 07/2012, mid-dist 2/3 of LV sev HK/AK, mild MR. echo 10/2012: EF 30-35%, sev HK ant-septal & inf walls, GR1DD, mild MR, PASP 33. c. echo 02/2013: EF  30%, GR1DD, mild MR. echo 04/2014: EF 30%, Septal-lat dyssynchrony, global HK, inf AK, GR1DD, mild MR. d. echo 10/2014: EF50-55%, WM nl, GR1DD, septal mild paradox. e. echo 02/2015: EF 50-55%, wm   . Depression   . Heart attack ([place])   . Heel ulcer ([place]) 04/27/2015  . History of blood transfusion ~ 2011   "plasma; had neuropathy; couldn't walk"  . Hypertension   . LBBB (left bundle branch block)   . Neuromuscular disorder ([place])   . Neuropathy 2011  . Obesity, unspecified   . OSA on CPAP    Moderate with AHI 23/hr and now on CPAP at 16cm H2O.  Her DME is [facility]  . Pure hypercholesterolemia   . Type II diabetes mellitus ([name])   . Unspecified vitamin D deficiency     PAST SURGICAL HISTORY:  Past Surgical History:  Procedure Laterality Date  . AMPUTATION TOE Right 06/18/2015   Procedure: AMPUTATION TOE;  Surgeon: Samara Deist, DPM;  Location: [facility] ORS;  Service: Podiatry;  Laterality: Right;  . AMPUTATION TOE Left 10/08/2018   Procedure: AMPUTATION TOE LEFT 2ND;  Surgeon: Albertine Patricia, DPM;  Location: [facility] ORS;  Service: Podiatry;  Laterality: Left;  . BACK SURGERY    . BI-VENTRICULAR IMPLANTABLE CARDIOVERTER DEFIBRILLATOR N/A 07/06/2014   Procedure: BI-VENTRICULAR IMPLANTABLE CARDIOVERTER DEFIBRILLATOR  (CRT-D);  Surgeon: Deboraha Sprang, MD;  Location: [facility] CATH LAB;  Service: Cardiovascular;  Laterality: N/A;  . BI-VENTRICULAR IMPLANTABLE CARDIOVERTER DEFIBRILLATOR  (CRT-D)  07/06/2014  . BILATERAL OOPHORECTOMY  01/2011   ovarian cyst benign  .  [place] [place] NAME: [name]    MR#:  [id number]  DATE OF BIRTH:  1956-04-13  DATE OF ADMISSION:  10/12/2018  PRIMARY CARE PHYSICIAN: Rutherford Guys, MD   REQUESTING/REFERRING PHYSICIAN:   CHIEF COMPLAINT:   Chief Complaint  Patient presents with  . Shortness of Breath    HISTORY OF PRESENT ILLNESS: [name]  is a 63 y.o. female with a known history history per below status post recent discharge after toe amputation/MSSA bacteremia/non-STEMI/on IV antibiotics with PICC line, presenting from home with O2 saturation 83% on room air, generalized weakness, fatigue, ER work-up noted for right-sided pneumonia, white count of 17,000,, patient evaluated in the emergency room, no apparent distress, resting comfortably in bed, patient now be admitted for acute hypoxic respiratory failure secondary to HCAP given recent hospital admission.  PAST MEDICAL HISTORY:   Past Medical History:  Diagnosis Date  . Acute myocardial infarction, subendocardial infarction, initial episode of care ([place]) 07/21/2012  . Acute osteomyelitis involving ankle and foot ([place]) 02/06/2015  . Acute systolic heart failure ([place]) 07/21/2012   New onset 07/19/12; admission to [facility] ED. Elevated Troponins.  S/p 2D-echo with EF 20-25%.  S/p cardiac catheterization with stenting LAD.  Repeat 2D-echo 10/2011 with improved EF of 35%.   . Anemia   . Automatic implantable cardioverter-defibrillator in situ    a. MDT CRT-D 06/2014, SN: [id number]    . CAD (coronary artery disease)    a. cardiac cath 101/04/2012: PCI/DES to chronically occluded mLAD, consideration PCI to diag branch in 4 weeks.   . Cataract   . Chicken pox   . Chronic systolic CHF (congestive heart failure) (HCC)    a. mixed ICM & NICM; b. EF 20-25% by echo 07/2012, mid-dist 2/3 of LV sev HK/AK, mild MR. echo 10/2012: EF 30-35%, sev HK ant-septal & inf walls, GR1DD, mild MR, PASP 33. c. echo 02/2013: EF  30%, GR1DD, mild MR. echo 04/2014: EF 30%, Septal-lat dyssynchrony, global HK, inf AK, GR1DD, mild MR. d. echo 10/2014: EF50-55%, WM nl, GR1DD, septal mild paradox. e. echo 02/2015: EF 50-55%, wm   . Depression   . Heart attack ([place])   . Heel ulcer ([place]) 04/27/2015  . History of blood transfusion ~ 2011   "plasma; had neuropathy; couldn't walk"  . Hypertension   . LBBB (left bundle branch block)   . Neuromuscular disorder ([place])   . Neuropathy 2011  . Obesity, unspecified   . OSA on CPAP    Moderate with AHI 23/hr and now on CPAP at 16cm H2O.  Her DME is [facility]  . Pure hypercholesterolemia   . Type II diabetes mellitus ([name])   . Unspecified vitamin D deficiency     PAST SURGICAL HISTORY:  Past Surgical History:  Procedure Laterality Date  . AMPUTATION TOE Right 06/18/2015   Procedure: AMPUTATION TOE;  Surgeon: Samara Deist, DPM;  Location: [facility] OR[facility];  Service: Podiatry;  Laterality: Right;  . AMPUTATION TOE Left 10/08/2018   Procedure: AMPUTATION TOE LEFT 2ND;  Surgeon: Albertine Patricia, DPM;  Location: [facility] OR[facility];  Service: Podiatry;  Laterality: Left;  . BACK SURGERY    . BI-VENTRICULAR IMPLANTABLE CARDIOVERTER DEFIBRILLATOR N/A 07/06/2014   Procedure: BI-VENTRICULAR IMPLANTABLE CARDIOVERTER DEFIBRILLATOR  (CRT-D);  Surgeon: Deboraha Sprang, MD;  Location: [facility] CATH LAB;  Service: Cardiovascular;  Laterality: N/A;  . BI-VENTRICULAR IMPLANTABLE CARDIOVERTER DEFIBRILLATOR  (CRT-D)  07/06/2014  . BILATERAL OOPHORECTOMY  01/2011   ovarian cyst benign  .  [place] [place] NAME: [name]    MR#:  [id number]  DATE OF BIRTH:  1956-04-13  DATE OF ADMISSION:  10/12/2018  PRIMARY CARE PHYSICIAN: Rutherford Guys, MD   REQUESTING/REFERRING PHYSICIAN:   CHIEF COMPLAINT:   Chief Complaint  Patient presents with  . Shortness of Breath    HISTORY OF PRESENT ILLNESS: [name]  is a 63 y.o. female with a known history history per below status post recent discharge after toe amputation/MSSA bacteremia/non-STEMI/on IV antibiotics with PICC line, presenting from home with O2 saturation 83% on room air, generalized weakness, fatigue, ER work-up noted for right-sided pneumonia, white count of 17,000,, patient evaluated in the emergency room, no apparent distress, resting comfortably in bed, patient now be admitted for acute hypoxic respiratory failure secondary to HCAP given recent hospital admission.  PAST MEDICAL HISTORY:   Past Medical History:  Diagnosis Date  . Acute myocardial infarction, subendocardial infarction, initial episode of care ([place]) 07/21/2012  . Acute osteomyelitis involving ankle and foot ([place]) 02/06/2015  . Acute systolic heart failure ([place]) 07/21/2012   New onset 07/19/12; admission to [facility] ED. Elevated Troponins.  S/p 2D-echo with EF 20-25%.  S/p cardiac catheterization with stenting LAD.  Repeat 2D-echo 10/2011 with improved EF of 35%.   . Anemia   . Automatic implantable cardioverter-defibrillator in situ    a. MDT CRT-D 06/2014, SN: [id number]    . CAD (coronary artery disease)    a. cardiac cath 101/04/2012: PCI/DES to chronically occluded mLAD, consideration PCI to diag branch in 4 weeks.   . Cataract   . Chicken pox   . Chronic systolic CHF (congestive heart failure) (HCC)    a. mixed ICM & NICM; b. EF 20-25% by echo 07/2012, mid-dist 2/3 of LV sev HK/AK, mild MR. echo 10/2012: EF 30-35%, sev HK ant-septal & inf walls, GR1DD, mild MR, PASP 33. c. echo 02/2013: EF  30%, GR1DD, mild MR. echo 04/2014: EF 30%, Septal-lat dyssynchrony, global HK, inf AK, GR1DD, mild MR. d. echo 10/2014: EF50-55%, WM nl, GR1DD, septal mild paradox. e. echo 02/2015: EF 50-55%, wm   . Depression   . Heart attack ([place])   . Heel ulcer ([place]) 04/27/2015  . History of blood transfusion ~ 2011   "plasma; had neuropathy; couldn't walk"  . Hypertension   . LBBB (left bundle branch block)   . Neuromuscular disorder ([place])   . Neuropathy 2011  . Obesity, unspecified   . OSA on CPAP    Moderate with AHI 23/hr and now on CPAP at 16cm H2O.  Her DME is [facility]  . Pure hypercholesterolemia   . Type II diabetes mellitus ([name])   . Unspecified vitamin D deficiency     PAST SURGICAL HISTORY:  Past Surgical History:  Procedure Laterality Date  . AMPUTATION TOE Right 06/18/2015   Procedure: AMPUTATION TOE;  Surgeon: Samara Deist, DPM;  Location: [facility] OR[facility];  Service: Podiatry;  Laterality: Right;  . AMPUTATION TOE Left 10/08/2018   Procedure: AMPUTATION TOE LEFT 2ND;  Surgeon: Albertine Patricia, DPM;  Location: [facility] OR[facility];  Service: Podiatry;  Laterality: Left;  . BACK SURGERY    . BI-VENTRICULAR IMPLANTABLE CARDIOVERTER DEFIBRILLATOR N/A 07/06/2014   Procedure: BI-VENTRICULAR IMPLANTABLE CARDIOVERTER DEFIBRILLATOR  (CRT-D);  Surgeon: Deboraha Sprang, MD;  Location: [facility] CATH LAB;  Service: Cardiovascular;  Laterality: N/A;  . BI-VENTRICULAR IMPLANTABLE CARDIOVERTER DEFIBRILLATOR  (CRT-D)  07/06/2014  . BILATERAL OOPHORECTOMY  01/2011   ovarian cyst benign  .  [place] [facility] [place] NAME: [name]    MR#:  [id number]  DATE OF BIRTH:  1956-04-13  DATE OF ADMISSION:  10/12/2018  PRIMARY CARE PHYSICIAN: Rutherford Guys, MD   REQUESTING/REFERRING PHYSICIAN:   CHIEF COMPLAINT:   Chief Complaint  Patient presents with  . Shortness of Breath    HISTORY OF PRESENT ILLNESS: [name]  is a 63 y.o. female with a known history history per below status post recent discharge after toe amputation/MSSA bacteremia/non-STEMI/on IV antibiotics with PICC line, presenting from home with O2 saturation 83% on room air, generalized weakness, fatigue, ER work-up noted for right-sided pneumonia, white count of 17,000,, patient evaluated in the emergency room, no apparent distress, resting comfortably in bed, patient now be admitted for acute hypoxic respiratory failure secondary to HCAP given recent hospital admission.  PAST MEDICAL HISTORY:   Past Medical History:  Diagnosis Date  . Acute myocardial infarction, subendocardial infarction, initial episode of care ([place]) 07/21/2012  . Acute osteomyelitis involving ankle and foot ([place]) 02/06/2015  . Acute systolic heart failure ([place]) 07/21/2012   New onset 07/19/12; admission to [facility] ED. Elevated Troponins.  S/p 2D-echo with EF 20-25%.  S/p cardiac catheterization with stenting LAD.  Repeat 2D-echo 10/2011 with improved EF of 35%.   . Anemia   . Automatic implantable cardioverter-defibrillator in situ    a. MDT CRT-D 06/2014, SN: [id number]    . CAD (coronary artery disease)    a. cardiac cath 101/04/2012: PCI/DES to chronically occluded mLAD, consideration PCI to diag branch in 4 weeks.   . Cataract   . Chicken pox   . Chronic systolic CHF (congestive heart failure) (HCC)    a. mixed ICM & NICM; b. EF 20-25% by echo 07/2012, mid-dist 2/3 of LV sev HK/AK, mild MR. echo 10/2012: EF 30-35%, sev HK ant-septal & inf walls, GR1DD, mild MR, PASP 33. c. echo 02/2013: EF  30%, GR1DD, mild MR. echo 04/2014: EF 30%, Septal-lat dyssynchrony, global HK, inf AK, GR1DD, mild MR. d. echo 10/2014: EF50-55%, WM nl, GR1DD, septal mild paradox. e. echo 02/2015: EF 50-55%, wm   . Depression   . Heart attack ([place])   . Heel ulcer ([place]) 04/27/2015  . History of blood transfusion ~ 2011   "plasma; had neuropathy; couldn't walk"  . Hypertension   . LBBB (left bundle branch block)   . Neuromuscular disorder ([place])   . Neuropathy 2011  . Obesity, unspecified   . OSA on CPAP    Moderate with AHI 23/hr and now on CPAP at 16cm H2O.  Her DME is [facility]  . Pure hypercholesterolemia   . Type II diabetes mellitus ([name])   . Unspecified vitamin D deficiency     PAST SURGICAL HISTORY:  Past Surgical History:  Procedure Laterality Date  . AMPUTATION TOE Right 06/18/2015   Procedure: AMPUTATION TOE;  Surgeon: Samara Deist, DPM;  Location: [facility] OR[facility];  Service: Podiatry;  Laterality: Right;  . AMPUTATION TOE Left 10/08/2018   Procedure: AMPUTATION TOE LEFT 2ND;  Surgeon: Albertine Patricia, DPM;  Location: [facility] ORS;  Service: Podiatry;  Laterality: Left;  . BACK SURGERY    . BI-VENTRICULAR IMPLANTABLE CARDIOVERTER DEFIBRILLATOR N/A 07/06/2014   Procedure: BI-VENTRICULAR IMPLANTABLE CARDIOVERTER DEFIBRILLATOR  (CRT-D);  Surgeon: Deboraha Sprang, MD;  Location: [facility] CATH LAB;  Service: Cardiovascular;  Laterality: N/A;  . BI-VENTRICULAR IMPLANTABLE CARDIOVERTER DEFIBRILLATOR  (CRT-D)  07/06/2014  . BILATERAL OOPHORECTOMY  01/2011   ovarian cyst benign  .  [place] [facility] [place] NAME: [name]    MR#:  [id number]  DATE OF BIRTH:  1956-04-13  DATE OF ADMISSION:  10/12/2018  PRIMARY CARE PHYSICIAN: Rutherford Guys, MD   REQUESTING/REFERRING PHYSICIAN:   CHIEF COMPLAINT:   Chief Complaint  Patient presents with  . Shortness of Breath    HISTORY OF PRESENT ILLNESS: [name]  is a 63 y.o. female with a known history history per below status post recent discharge after toe amputation/MSSA bacteremia/non-STEMI/on IV antibiotics with PICC line, presenting from home with O2 saturation 83% on room air, generalized weakness, fatigue, ER work-up noted for right-sided pneumonia, white count of 17,000,, patient evaluated in the emergency room, no apparent distress, resting comfortably in bed, patient now be admitted for acute hypoxic respiratory failure secondary to HCAP given recent hospital admission.  PAST MEDICAL HISTORY:   Past Medical History:  Diagnosis Date  . Acute myocardial infarction, subendocardial infarction, initial episode of care ([place]) 07/21/2012  . Acute osteomyelitis involving ankle and foot ([place]) 02/06/2015  . Acute systolic heart failure ([place]) 07/21/2012   New onset 07/19/12; admission to [facility] ED. Elevated Troponins.  S/p 2D-echo with EF 20-25%.  S/p cardiac catheterization with stenting LAD.  Repeat 2D-echo 10/2011 with improved EF of 35%.   . Anemia   . Automatic implantable cardioverter-defibrillator in situ    a. MDT CRT-D 06/2014, SN: [id number]    . CAD (coronary artery disease)    a. cardiac cath 101/04/2012: PCI/DES to chronically occluded mLAD, consideration PCI to diag branch in 4 weeks.   . Cataract   . Chicken pox   . Chronic systolic CHF (congestive heart failure) (HCC)    a. mixed ICM & NICM; b. EF 20-25% by echo 07/2012, mid-dist 2/3 of LV sev HK/AK, mild MR. echo 10/2012: EF 30-35%, sev HK ant-septal & inf walls, GR1DD, mild MR, PASP 33. c. echo 02/2013: EF  30%, GR1DD, mild MR. echo 04/2014: EF 30%, Septal-lat dyssynchrony, global HK, inf AK, GR1DD, mild MR. d. echo 10/2014: EF50-55%, WM nl, GR1DD, septal mild paradox. e. echo 02/2015: EF 50-55%, wm   . Depression   . Heart attack ([place])   . Heel ulcer ([place]) 04/27/2015  . History of blood transfusion ~ 2011   "plasma; had neuropathy; couldn't walk"  . Hypertension   . LBBB (left bundle branch block)   . Neuromuscular disorder ([place])   . Neuropathy 2011  . Obesity, unspecified   . OSA on CPAP    Moderate with AHI 23/hr and now on CPAP at 16cm H2O.  Her DME is [facility]  . Pure hypercholesterolemia   . Type II diabetes mellitus ([name])   . Unspecified vitamin D deficiency     PAST SURGICAL HISTORY:  Past Surgical History:  Procedure Laterality Date  . AMPUTATION TOE Right 06/18/2015   Procedure: AMPUTATION TOE;  Surgeon: Samara Deist, DPM;  Location: [facility] OR[facility];  Service: Podiatry;  Laterality: Right;  . AMPUTATION TOE Left 10/08/2018   Procedure: AMPUTATION TOE LEFT 2ND;  Surgeon: Albertine Patricia, DPM;  Location: [facility] OR[facility];  Service: Podiatry;  Laterality: Left;  . BACK SURGERY    . BI-VENTRICULAR IMPLANTABLE CARDIOVERTER DEFIBRILLATOR N/A 07/06/2014   Procedure: BI-VENTRICULAR IMPLANTABLE CARDIOVERTER DEFIBRILLATOR  (CRT-D);  Surgeon: Deboraha Sprang, MD;  Location: [facility] CATH LAB;  Service: Cardiovascular;  Laterality: N/A;  . BI-VENTRICULAR IMPLANTABLE CARDIOVERTER DEFIBRILLATOR  (CRT-D)  07/06/2014  . BILATERAL OOPHORECTOMY  01/2011   ovarian cyst benign  .

## 2018-10-12 NOTE — Progress Notes (Signed)
Pharmacy Antibiotic Note  [name] is a 63 y.o. female admitted on 10/12/2018 with pneumonia.  Pharmacy has been consulted for Vancomycin dosing.  Plan:  Will order Vancomycin 2 gm ( 20 mg/kg X AdjBW).  Pt renal function is unstable and continues to worsen.  Will not use AUC dosing method.   Will draw random level in 48 hrs on 1/8 @ 22:00. Will use random levels to dose Vanc until renal function stabilizes.   Height: 5\' 8"  (172.7 cm) Weight: 285 lb (129.3 kg) IBW/kg (Calculated) : 63.9  Temp (24hrs), Avg:98 F (36.7 C), Min:98 F (36.7 C), Max:98 F (36.7 C)  Recent Labs  Lab 10/06/18 0433 10/07/18 0351 10/09/18 0635 10/12/18 1517  WBC  --  13.3*  --  17.2*  CREATININE 1.35* 1.46* 1.59* 1.98*    Estimated Creatinine Clearance: 40.8 mL/min (A) (by C-G formula based on SCr of 1.98 mg/dL (H)).    No Known Allergies  Antimicrobials this admission:   >>    >>   Dose adjustments this admission:   Microbiology results:  BCx:  UCx:    Sputum:    MRSA PCR:   Thank you for allowing pharmacy to be a part of this patient's care.  Jakel Alphin D 10/12/2018 10:20 PM

## 2018-10-12 NOTE — Telephone Encounter (Signed)
LMOV to schedule  

## 2018-10-12 NOTE — Telephone Encounter (Unsigned)
Copied from [place] [id number]. Topic: Quick Communication - Home Health Verbal Orders >> Oct 12, 2018  9:40 AM Virl Axe D wrote: Caller/Agency: Ria Comment, RN, [place] Number: [phone] / secure vm Requesting OT/PT/Skilled Nursing/Social Work: Skilled nursing Frequency: 1 week 6  Pt has a surgical wound. There are no specific orders for this. Please provide as well.

## 2018-10-12 NOTE — ED Triage Notes (Signed)
PAtient arrived by EMS from home for SOB that started this AM. HX CHF, pacemaker/defibrillater, neropathy, diabetes and HTN. Unlabored breathing during triage

## 2018-10-12 NOTE — ED Provider Notes (Signed)
[facility] Emergency Department Provider Note   ____________________________________________   First MD Initiated Contact with Patient 10/12/18 1924     (approximate)  I have reviewed the triage vital signs and the nursing notes.   HISTORY  Chief Complaint Shortness of Breath    HPI [name] is a 63 y.o. female with a history of nonproductive cough shortness of breath for several days.  Is been getting worse.  She was recently in the hospital for toe amputation.  Chest x-ray looks like a pneumonia.  O2 sats dropped down to 79 if she stands and takes 1 step.  Patient is not running a fever.  She is on Ancef already.   Past Medical History:  Diagnosis Date  . Acute myocardial infarction, subendocardial infarction, initial episode of care ([place]) 07/21/2012  . Acute osteomyelitis involving ankle and foot ([place]) 02/06/2015  . Acute systolic heart failure ([place]) 07/21/2012   New onset 07/19/12; admission to [facility] ED. Elevated Troponins.  S/p 2D-echo with EF 20-25%.  S/p cardiac catheterization with stenting LAD.  Repeat 2D-echo 10/2011 with improved EF of 35%.   . Anemia   . Automatic implantable cardioverter-defibrillator in situ    a. MDT CRT-D 06/2014, SN: [id number]    . CAD (coronary artery disease)    a. cardiac cath 101/04/2012: PCI/DES to chronically occluded mLAD, consideration PCI to diag branch in 4 weeks.   . Cataract   . Chicken pox   . Chronic systolic CHF (congestive heart failure) (HCC)    a. mixed ICM & NICM; b. EF 20-25% by echo 07/2012, mid-dist 2/3 of LV sev HK/AK, mild MR. echo 10/2012: EF 30-35%, sev HK ant-septal & inf walls, GR1DD, mild MR, PASP 33. c. echo 02/2013: EF 30%, GR1DD, mild MR. echo 04/2014: EF 30%, Septal-lat dyssynchrony, global HK, inf AK, GR1DD, mild MR. d. echo 10/2014: EF50-55%, WM nl, GR1DD, septal mild paradox. e. echo 02/2015: EF 50-55%, wm   . Depression   . Heart attack ([place])   . Heel ulcer ([place]) 04/27/2015  .  History of blood transfusion ~ 2011   "plasma; had neuropathy; couldn't walk"  . Hypertension   . LBBB (left bundle branch block)   . Neuromuscular disorder ([place])   . Neuropathy 2011  . Obesity, unspecified   . OSA on CPAP    Moderate with AHI 23/hr and now on CPAP at 16cm H2O.  Her DME is [facility]  . Pure hypercholesterolemia   . Type II diabetes mellitus ([place])   . Unspecified vitamin D deficiency     Patient Active Problem List   Diagnosis Date Noted  . Sepsis ([place]) 09/28/2018  . History of amputation of lesser toe of right foot ([place]) 04/19/2018  . 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 Paraparesis of both lower limbs ([place]) 03/29/2018  . 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 Paroxysmal atrial fibrillation ([place]) 03/29/2018  . Class 3 obesity due to excess calories with serious comorbidity and body mass index (BMI) of 40.0 to 44.9 in adult 09/02/2016  . History of CVA (cerebrovascular accident) 11/11/2015  . Chronic renal insufficiency 08/04/2015  . OSA (obstructive sleep apnea) 08/15/2014  . Morbid obesity ([place]) 10/26/2013  . Diarrhea, unspecified 04/29/2013  . Essential hypertension, benign 04/29/2013  . Pure hypercholesterolemia 12/07/2012  . Iron deficiency anemia 12/07/2012  . Diabetic peripheral neuropathy associated with type 2 diabetes mellitus ([place]) 12/07/2012  . Chronic systolic congestive heart failure ([place]) 07/29/2012  . Left bundle-branch block 07/25/2012  .  Atherosclerotic heart disease of native coronary artery without angina pectoris 07/25/2012    Past Surgical History:  Procedure Laterality Date  . AMPUTATION TOE Right 06/18/2015   Procedure: AMPUTATION TOE;  Surgeon: Samara Deist, DPM;  Location: [facility] ORS;  Service: Podiatry;  Laterality: Right;  . AMPUTATION TOE Left 10/08/2018   Procedure: AMPUTATION TOE LEFT 2ND;  Surgeon: Albertine Patricia, DPM;  Location: [facility] ORS;  Service: Podiatry;  Laterality: Left;  . BACK SURGERY    .  BI-VENTRICULAR IMPLANTABLE CARDIOVERTER DEFIBRILLATOR N/A 07/06/2014   Procedure: BI-VENTRICULAR IMPLANTABLE CARDIOVERTER DEFIBRILLATOR  (CRT-D);  Surgeon: Deboraha Sprang, MD;  Location: [facility] CATH LAB;  Service: Cardiovascular;  Laterality: N/A;  . BI-VENTRICULAR IMPLANTABLE CARDIOVERTER DEFIBRILLATOR  (CRT-D)  07/06/2014  . BILATERAL OOPHORECTOMY  01/2011   ovarian cyst benign  . COLONOSCOPY WITH PROPOFOL Left 02/22/2015   Procedure: COLONOSCOPY WITH PROPOFOL;  Surgeon: Hulen Luster, MD;  Location: [facility] ENDOSCOPY;  Service: Endoscopy;  Laterality: Left;  . CORONARY ANGIOPLASTY WITH STENT PLACEMENT Left 07/2012   new onset systolic CHF; elevated troponins.  Cardiac catheterization with stenting to LAD; EF 15%.  2D-echo: EF 20-25%.  . ESOPHAGOGASTRODUODENOSCOPY N/A 02/22/2015   Procedure: ESOPHAGOGASTRODUODENOSCOPY (EGD);  Surgeon: Hulen Luster, MD;  Location: [facility] ENDOSCOPY;  Service: Endoscopy;  Laterality: N/A;  . INCISION AND DRAINAGE ABSCESS Right 2007   groin; with ICU stay due to sepsis.  Marland Kitchen LAPAROSCOPIC CHOLECYSTECTOMY  2011  . LEFT HEART CATH AND CORONARY ANGIOGRAPHY N/A 10/05/2018   Procedure: LEFT HEART CATH AND CORONARY ANGIOGRAPHY;  Surgeon: Minna Merritts, MD;  Location: [place] CV LAB;  Service: Cardiovascular;  Laterality: N/A;  . LEFT HEART CATHETERIZATION WITH CORONARY ANGIOGRAM N/A 07/21/2012   Procedure: LEFT HEART CATHETERIZATION WITH CORONARY ANGIOGRAM;  Surgeon: Jolaine Artist, MD;  Location: [facility] CATH LAB;  Service: Cardiovascular;  Laterality: N/A;  . [place]   L4-5  . PERCUTANEOUS CORONARY STENT INTERVENTION (PCI-S) N/A 07/23/2012   Procedure: PERCUTANEOUS CORONARY STENT INTERVENTION (PCI-S);  Surgeon: Sherren Mocha, MD;  Location: [facility] CATH LAB;  Service: Cardiovascular;  Laterality: N/A;  . PERIPHERAL VASCULAR BALLOON ANGIOPLASTY Left 10/06/2018   Procedure: PERIPHERAL VASCULAR BALLOON ANGIOPLASTY;  Surgeon: Katha Cabal, MD;  Location: [place] CV LAB;  Service: Cardiovascular;  Laterality: Left;  . PERIPHERAL VASCULAR CATHETERIZATION N/A 02/10/2015   Procedure: Picc Line Insertion;  Surgeon: Katha Cabal, MD;  Location: [place] CV LAB;  Service: Cardiovascular;  Laterality: N/A;  . TEE WITHOUT CARDIOVERSION N/A 10/02/2018   Procedure: TRANSESOPHAGEAL ECHOCARDIOGRAM (TEE);  Surgeon: Wellington Hampshire, MD;  Location: [facility] ORS;  Service: Cardiovascular;  Laterality: N/A;  . [place]  . VAGINAL HYSTERECTOMY  01/2011   Fibroids/DUB.  Ovaries removed. [place].    Prior to Admission medications   Medication Sig Start Date End Date Taking? Authorizing Provider  AMBULATORY NON FORMULARY MEDICATION 1 Units by Other route once. Medication Name: foot brace 11/15/15   Narda Amber K, DO  amiodarone (PACERONE) 200 MG tablet Take 1 tablet (200 mg total) by mouth 2 (two) times daily. 10/09/18   Salary, Holly Bodily D, MD  aspirin EC 81 MG EC tablet Take 1 tablet (81 mg total) by mouth daily. 10/10/18   Salary, Avel Peace, MD  Calcium Carb-Cholecalciferol (CALCIUM 600 + D PO) Take 1 tablet by mouth 2 (two) times daily.    [provider]  carvedilol (COREG) 12.5 MG tablet Take 1 tablet (12.5 mg total) by mouth 2 (  two) times daily with a meal. 10/09/18   Salary, Holly Bodily D, MD  ceFAZolin (ANCEF) 2-4 GM/100ML-% IVPB Inject 100 mLs (2 g total) into the vein every 8 (eight) hours. 10/09/18 11/10/18  Salary, Avel Peace, MD  ceFAZolin (ANCEF) IVPB Inject 2 g into the vein every 8 (eight) hours. Indication: MSSA bacteremia and toe infection Last Day of Therapy:  11/10/2018 Labs - Once weekly:  CBC/D and BMP, Labs - Every other week:  ESR and CRP 10/10/18 11/10/18  Salary, Montell D, MD  citalopram (CELEXA) 20 MG tablet Take 1 tablet (20 mg total) by mouth daily. 04/15/18   Wardell Honour, MD  clopidogrel (PLAVIX) 75 MG tablet Take 1 tablet (75 mg total) by mouth daily. 10/10/18   Salary, Holly Bodily D, MD  ferrous sulfate 325 (65 FE) MG tablet  Take 325 mg by mouth daily.    [provider]  glimepiride (AMARYL) 4 MG tablet Take 1 tablet (4 mg total) by mouth 2 (two) times daily with a meal. 04/15/18   Wardell Honour, MD  glucose blood (ONE TOUCH ULTRA TEST) test strip Check sugar three times daily dx: DMII uncontrolled insulin dependent with complications 12/26/00   Wardell Honour, MD  HYDROcodone-acetaminophen (NORCO/VICODIN) 5-325 MG tablet Take 1-2 tablets by mouth every 6 (six) hours as needed for severe pain. 10/09/18   Salary, Avel Peace, MD  insulin glargine (LANTUS) 100 UNIT/ML injection Inject 0.2 mLs (20 Units total) into the skin daily. 10/10/18   Salary, Avel Peace, MD  Insulin Pen Needle (B-D ULTRAFINE III SHORT PEN) 31G X 8 MM MISC USE AS DIRECTED WITH LANTUS; dx: DMII uncontrolled, insulin dependent, with complications 5/42/70   Wardell Honour, MD  lidocaine (LIDODERM) 5 % Place 1 patch onto the skin daily. Remove & Discard patch within 12 hours or as directed by MD 10/09/18   Salary, Avel Peace, MD  losartan (COZAAR) 25 MG tablet Take 1 tablet (25 mg total) by mouth daily. 10/10/18   Salary, Avel Peace, MD  nitroGLYCERIN (NITROSTAT) 0.4 MG SL tablet Place 1 tablet (0.4 mg total) under the tongue every 5 (five) minutes as needed for chest pain. 10/09/18   Minna Merritts, MD  omega-3 acid ethyl esters (LOVAZA) 1 g capsule Take 1 g by mouth 2 (two) times daily.    [provider]  protein supplement shake (PREMIER PROTEIN) LIQD Take 325 mLs (11 oz total) by mouth 2 (two) times daily between meals. 10/09/18   Salary, Avel Peace, MD  rosuvastatin (CRESTOR) 40 MG tablet TAKE 1 TABLET BY MOUTH ONCE DAILY Patient not taking: Reported on 09/28/2018 12/16/17   Minna Merritts, MD  torsemide (DEMADEX) 20 MG tablet Take 1 tablet (20 mg total) by mouth daily. Patient taking differently: Take 20 mg by mouth at bedtime.  04/15/18   Wardell Honour, MD    Allergies Patient has no known allergies.  Family History  Problem Relation  Age of Onset  . Diabetes Mother   . Hypertension Mother   . Arthritis Mother        knees, lumbar DDD, cervical DDD  . Cancer Father        prostate,skin,lymphoma.  . Cancer Brother 67       bladder cancer; non-smoker  . Diabetes Maternal Grandmother   . Heart disease Maternal Grandmother   . COPD Maternal Grandmother   . Diabetes Paternal Grandmother   . Obesity Brother   . Diabetes Son   . Hypertension Son   .  Cancer Maternal Grandfather   . Diabetes Paternal Grandfather     Social History Social History   Tobacco Use  . Smoking status: Never Smoker  . Smokeless tobacco: Never Used  Substance Use Topics  . Alcohol use: No    Alcohol/week: 0.0 standard drinks  . Drug use: No    Review of Systems  Constitutional: No fever/chills Eyes: No visual changes. ENT: No sore throat. Cardiovascular: Denies chest pain. Respiratory:  shortness of breath. Gastrointestinal: No abdominal pain.  No nausea, no vomiting.  No diarrhea.  No constipation. Genitourinary: Negative for dysuria. Musculoskeletal: Negative for back pain. Skin: Negative for rash. Neurological: Negative for headaches, focal weakness   ____________________________________________   PHYSICAL EXAM:  VITAL SIGNS: ED Triage Vitals  Enc Vitals Group     BP 10/12/18 1515 (!) 116/55     Pulse Rate 10/12/18 1515 60     Resp 10/12/18 1515 16     Temp 10/12/18 1515 98 F (36.7 C)     Temp Source 10/12/18 1515 Oral     SpO2 10/12/18 1515 95 %     Weight 10/12/18 1516 285 lb (129.3 kg)     Height 10/12/18 1516 _0  (1.727 m)     Head Circumference --      Peak Flow --      Pain Score 10/12/18 1515 0     Pain Loc --      Pain Edu? --      Excl. in [place]? --     Constitutional: Alert and oriented.  Looks uncomfortable and short of breath Eyes: Conjunctivae are normal.  Head: Atraumatic. Nose: No congestion/rhinnorhea. Mouth/Throat: Mucous membranes are moist.  Oropharynx non-erythematous. Neck: No  stridor.  Cardiovascular: Normal rate, regular rhythm. Grossly normal heart sounds.  Good peripheral circulation. Respiratory: Normal respiratory effort.  No retractions. Lungs crackles on the right Gastrointestinal: Soft and nontender. No distention. No abdominal bruits. No CVA tenderness. Musculoskeletal: No lower extremity tenderness bilateral edema worse on the left where her surgery was edema.   Neurologic:  Normal speech and language. No gross focal neurologic deficits are appreciated. Skin:  Skin is warm, dry and intact. No rash noted. Psychiatric: Mood and affect are normal. Speech and behavior are normal.  ____________________________________________   LABS (all labs ordered are listed, but only abnormal results are displayed)  Labs Reviewed  BASIC METABOLIC PANEL - Abnormal; Notable for the following components:      Result Value   Sodium 132 (*)    Glucose, Bld 268 (*)    BUN 39 (*)    Creatinine, Ser 1.98 (*)    Calcium 8.1 (*)    GFR calc non Af Amer 26 (*)    GFR calc Af Amer 30 (*)    All other components within normal limits  CBC - Abnormal; Notable for the following components:   WBC 17.2 (*)    RBC 3.59 (*)    Hemoglobin 10.4 (*)    HCT 32.4 (*)    All other components within normal limits  TROPONIN I - Abnormal; Notable for the following components:   Troponin I 0.04 (*)    All other components within normal limits   ____________________________________________  EKG  EKG read interpreted by me shows AV dual pacer at 60.  There are flipped T's in the anterior septal leads ____________________________________________  RADIOLOGY  ED MD interpretation: Chest x-ray read by radiology as right upper lobe pneumonia reviewed by me I think there is also  a right lower lobe pneumonia.  Official radiology report(s): Dg Chest 2 View  Result Date: 10/12/2018 CLINICAL DATA:  Shortness of breath. EXAM: CHEST - 2 VIEW COMPARISON:  Radiograph of September 30, 2018.  FINDINGS: Stable cardiomediastinal silhouette. Left-sided pacemaker is unchanged in position. Atherosclerosis of abdominal aorta is noted. Left lung is clear. No pneumothorax is noted. New right upper lobe airspace opacity is noted consistent with pneumonia. Right-sided PICC line is noted with distal tip in expected position of SVC. Small right pleural effusion is noted. Bony thorax is unremarkable. IMPRESSION: Large right upper lobe airspace opacity is noted consistent with pneumonia. Followup PA and lateral chest X-ray is recommended in 3-4 weeks following trial of antibiotic therapy to ensure resolution and exclude underlying malignancy. Right-sided PICC line is noted in good position. Small right pleural effusion is noted. Aortic Atherosclerosis (ICD10-I70.0). Electronically Signed   By: Marijo Conception, M.D.   On: 10/12/2018 16:08    ____________________________________________   PROCEDURES  Procedure(s) performed:  Procedures  Critical Care performed:   ____________________________________________   INITIAL IMPRESSION / ASSESSMENT AND PLAN / ED COURSE Patient hypoxic with no pneumonia we will have to get her in the hospital.      ____________________________________________   FINAL CLINICAL IMPRESSION(S) / ED DIAGNOSES  Final diagnoses:  Healthcare-associated pneumonia  Hypoxia     ED Discharge Orders    None       Note:  This document was prepared using Dragon voice recognition software and may include unintentional dictation errors.    Nena Polio, MD 10/12/18 [phone]

## 2018-10-12 NOTE — Telephone Encounter (Signed)
pls give verbal order to be given for pt care

## 2018-10-12 NOTE — Telephone Encounter (Signed)
Raquel Sarna, physical therapist with [facility] calling to report that the pt is 83-87% on RA at rest. Pt was recently discharged from the hospital after having a partial amputation,vascular access surgery and also having a MI while hospitalized. Pt has a  picc line and is receiving ancef. Pt had complaints of having a  persistent cough and feel weaker and more short of breath with tightness under her breast today. Raquel Sarna reports that when pt was visited by the nurse previously her O2 sats were 98%. Raquel Sarna advised that the pt would need to seek treatment in the ED for current symptoms. Understanding verbalized.

## 2018-10-12 NOTE — Progress Notes (Signed)
Family Meeting Note  Advance Directive:yes  Today a meeting took place with the Patient.  Patient is able to participate   The following clinical team members were present during this meeting:MD  The following were discussed:Patient's diagnosis: hcap, mi, bacteremia, Patient's progosis: Unable to determine and Goals for treatment: DNR  Additional follow-up to be provided: prn  Time spent during discussion:20 minutes  Gorden Harms, MD

## 2018-10-13 DIAGNOSIS — J189 Pneumonia, unspecified organism: Principal | ICD-10-CM

## 2018-10-13 DIAGNOSIS — Z7189 Other specified counseling: Secondary | ICD-10-CM

## 2018-10-13 DIAGNOSIS — R0902 Hypoxemia: Secondary | ICD-10-CM

## 2018-10-13 DIAGNOSIS — Z515 Encounter for palliative care: Secondary | ICD-10-CM

## 2018-10-13 DIAGNOSIS — Z66 Do not resuscitate: Secondary | ICD-10-CM

## 2018-10-13 LAB — BASIC METABOLIC PANEL
Anion gap: 8 (ref 5–15)
BUN: 43 mg/dL — AB (ref 8–23)
CO2: 21 mmol/L — AB (ref 22–32)
Calcium: 7.7 mg/dL — ABNORMAL LOW (ref 8.9–10.3)
Chloride: 102 mmol/L (ref 98–111)
Creatinine, Ser: 2.52 mg/dL — ABNORMAL HIGH (ref 0.44–1.00)
GFR calc Af Amer: 23 mL/min — ABNORMAL LOW (ref >60)
GFR calc non Af Amer: 19 mL/min — ABNORMAL LOW (ref >60)
Glucose, Bld: 173 mg/dL — ABNORMAL HIGH (ref 70–99)
Potassium: 4 mmol/L (ref 3.5–5.1)
Sodium: 131 mmol/L — ABNORMAL LOW (ref 135–145)

## 2018-10-13 LAB — GLUCOSE, CAPILLARY
Glucose-Capillary: 217 mg/dL — ABNORMAL HIGH (ref 70–99)
Glucose-Capillary: 158 mg/dL — ABNORMAL HIGH (ref 70–99)
Glucose-Capillary: 192 mg/dL — ABNORMAL HIGH (ref 70–99)
Glucose-Capillary: 232 mg/dL — ABNORMAL HIGH (ref 70–99)
Glucose-Capillary: 164 mg/dL — ABNORMAL HIGH (ref 70–99)
Glucose-Capillary: 167 mg/dL — ABNORMAL HIGH (ref 70–99)

## 2018-10-13 LAB — AEROBIC/ANAEROBIC CULTURE W GRAM STAIN (SURGICAL/DEEP WOUND)

## 2018-10-13 MED ORDER — SODIUM CHLORIDE 0.9 % IV SOLN
1.0000 g | Freq: Three times a day (TID) | INTRAVENOUS | Status: DC
  Administered 2018-10-13: 1 g via INTRAVENOUS
  Filled 2018-10-13 (×4): qty 1

## 2018-10-13 MED ORDER — ASPIRIN EC 81 MG PO TBEC
81.0000 mg | DELAYED_RELEASE_TABLET | Freq: Every day | ORAL | Status: DC
  Administered 2018-10-13 – 2018-10-16 (×4): 81 mg via ORAL
  Filled 2018-10-13 (×4): qty 1

## 2018-10-13 MED ORDER — HYDROCODONE-ACETAMINOPHEN 5-325 MG PO TABS
1.0000 | ORAL_TABLET | Freq: Four times a day (QID) | ORAL | Status: DC | PRN
  Administered 2018-10-13 – 2018-10-16 (×4): 2 via ORAL
  Filled 2018-10-13 (×4): qty 2

## 2018-10-13 MED ORDER — AMIODARONE HCL 200 MG PO TABS
200.0000 mg | ORAL_TABLET | Freq: Two times a day (BID) | ORAL | Status: DC
  Administered 2018-10-13 – 2018-10-16 (×8): 200 mg via ORAL
  Filled 2018-10-13 (×8): qty 1

## 2018-10-13 MED ORDER — CLOPIDOGREL BISULFATE 75 MG PO TABS
75.0000 mg | ORAL_TABLET | Freq: Every day | ORAL | Status: DC
  Administered 2018-10-13 – 2018-10-16 (×4): 75 mg via ORAL
  Filled 2018-10-13 (×4): qty 1

## 2018-10-13 MED ORDER — GLIMEPIRIDE 4 MG PO TABS
4.0000 mg | ORAL_TABLET | Freq: Two times a day (BID) | ORAL | Status: DC
  Filled 2018-10-13: qty 1

## 2018-10-13 MED ORDER — TORSEMIDE 20 MG PO TABS: 20.0000 mg | ORAL_TABLET | Freq: Every day | ORAL | Status: DC

## 2018-10-13 MED ORDER — NITROGLYCERIN 0.4 MG SL SUBL: 0.4000 mg | SUBLINGUAL_TABLET | SUBLINGUAL | Status: DC | PRN

## 2018-10-13 MED ORDER — CARVEDILOL 12.5 MG PO TABS
12.5000 mg | ORAL_TABLET | Freq: Two times a day (BID) | ORAL | Status: DC
  Administered 2018-10-13 – 2018-10-16 (×7): 12.5 mg via ORAL
  Filled 2018-10-13 (×8): qty 1

## 2018-10-13 MED ORDER — INSULIN GLARGINE 100 UNIT/ML ~~LOC~~ SOLN
20.0000 [IU] | Freq: Every day | SUBCUTANEOUS | Status: DC
  Administered 2018-10-13: 20 [IU] via SUBCUTANEOUS
  Administered 2018-10-14: 16 [IU] via SUBCUTANEOUS
  Filled 2018-10-13 (×2): qty 0.2

## 2018-10-13 MED ORDER — HEPARIN SODIUM (PORCINE) 5000 UNIT/ML IJ SOLN
5000.0000 [IU] | Freq: Three times a day (TID) | INTRAMUSCULAR | Status: DC
  Administered 2018-10-13 – 2018-10-16 (×10): 5000 [IU] via SUBCUTANEOUS
  Filled 2018-10-13 (×10): qty 1

## 2018-10-13 MED ORDER — LOSARTAN POTASSIUM 25 MG PO TABS: 25.0000 mg | ORAL_TABLET | Freq: Every day | ORAL | Status: DC

## 2018-10-13 MED ORDER — SODIUM CHLORIDE 0.9% FLUSH
3.0000 mL | Freq: Two times a day (BID) | INTRAVENOUS | Status: DC
  Administered 2018-10-13 – 2018-10-15 (×3): 3 mL via INTRAVENOUS

## 2018-10-13 MED ORDER — PREMIER PROTEIN SHAKE: 11.0000 [oz_av] | Freq: Two times a day (BID) | ORAL | Status: DC

## 2018-10-13 MED ORDER — CITALOPRAM HYDROBROMIDE 20 MG PO TABS
20.0000 mg | ORAL_TABLET | Freq: Every day | ORAL | Status: DC
  Administered 2018-10-13 – 2018-10-16 (×4): 20 mg via ORAL
  Filled 2018-10-13 (×4): qty 1

## 2018-10-13 MED ORDER — LIDOCAINE 5 % EX PTCH
1.0000 | MEDICATED_PATCH | CUTANEOUS | Status: DC
  Administered 2018-10-13 – 2018-10-16 (×4): 1 via TRANSDERMAL
  Filled 2018-10-13 (×4): qty 1

## 2018-10-13 MED ORDER — OMEGA-3-ACID ETHYL ESTERS 1 G PO CAPS
1.0000 g | ORAL_CAPSULE | Freq: Two times a day (BID) | ORAL | Status: DC
  Administered 2018-10-13 – 2018-10-16 (×7): 1 g via ORAL
  Filled 2018-10-13 (×9): qty 1

## 2018-10-13 MED ORDER — ROSUVASTATIN CALCIUM 10 MG PO TABS
40.0000 mg | ORAL_TABLET | Freq: Every day | ORAL | Status: DC
  Administered 2018-10-13 – 2018-10-16 (×4): 40 mg via ORAL
  Filled 2018-10-13 (×4): qty 4

## 2018-10-13 MED ORDER — SODIUM CHLORIDE 0.9% FLUSH: 3.0000 mL | INTRAVENOUS | Status: DC | PRN

## 2018-10-13 MED ORDER — SODIUM CHLORIDE 0.9 % IV SOLN
INTRAVENOUS | Status: DC
  Administered 2018-10-13 – 2018-10-14 (×2): via INTRAVENOUS

## 2018-10-13 MED ORDER — SODIUM CHLORIDE 0.9 % IV SOLN: 250.0000 mL | INTRAVENOUS | Status: DC | PRN

## 2018-10-13 MED ORDER — CALCIUM CARBONATE-VITAMIN D 500-200 MG-UNIT PO TABS
1.0000 | ORAL_TABLET | Freq: Every day | ORAL | Status: DC
  Administered 2018-10-13 – 2018-10-16 (×4): 1 via ORAL
  Filled 2018-10-13 (×5): qty 1

## 2018-10-13 MED ORDER — SODIUM CHLORIDE 0.9 % IV SOLN
2.0000 g | INTRAVENOUS | Status: DC
  Administered 2018-10-14 – 2018-10-16 (×3): 2 g via INTRAVENOUS
  Filled 2018-10-13 (×4): qty 2

## 2018-10-13 MED ORDER — FERROUS SULFATE 325 (65 FE) MG PO TABS
325.0000 mg | ORAL_TABLET | Freq: Every day | ORAL | Status: DC
  Administered 2018-10-13 – 2018-10-16 (×4): 325 mg via ORAL
  Filled 2018-10-13 (×4): qty 1

## 2018-10-13 NOTE — Progress Notes (Signed)
[facility]  Patient Status: Active  [facility] is providing the following services: SN, PT, SW, IV ABX (cefazolin 2g q8 until 2/4) will need resumption of care orders at discharge  If patient discharges after hours, please call [phone].   [place] 10/13/2018, 10:18 AM

## 2018-10-13 NOTE — Progress Notes (Signed)
Pharmacy Antibiotic Note  [name] is a 63 y.o. female admitted on 10/12/2018 with pneumonia.  Pharmacy has been consulted for Vancomycin dosing.  Plan: Serum creatinine rose overnight to 2.52 mg/dl - continue with vancomycin dosing based on random levels until renal function stabilizes.   Reduce cefepime from 1 gm IV Q8H to 2 gm IV Q24H based on CrCl 30 to 60 mL/min.   Height: 5\' 8"  (172.7 cm) Weight: 296 lb 8.3 oz (134.5 kg) IBW/kg (Calculated) : 63.9  Temp (24hrs), Avg:98.4 F (36.9 C), Min:98 F (36.7 C), Max:98.7 F (37.1 C)  Recent Labs  Lab 10/07/18 0351 10/09/18 0635 10/12/18 1517 10/13/18 0822  WBC 13.3*  --  17.2*  --   CREATININE 1.46* 1.59* 1.98* 2.52*    Estimated Creatinine Clearance: 32.8 mL/min (A) (by C-G formula based on SCr of 2.52 mg/dL (H)).    No Known Allergies  Antimicrobials this admission:   >>    >>   Dose adjustments this admission:   Microbiology results:  BCx:  UCx:    Sputum:    MRSA PCR:   Thank you for allowing pharmacy to be a part of this patient's care.  Laural Benes, PharmD, BCPS Clinical Pharmacist 10/13/2018 9:14 AM

## 2018-10-13 NOTE — Care Management Note (Signed)
Case Management Note  Patient Details  Name: [name] MRN: [id number] Date of Birth: 1956/03/14  Subjective/Objective:                  Met with patient to discuss Discharge,  Patient lives at home with husnband and has [facility] for [facility] currently Patient has a shower chair a wheel chair, a walker and has no DME needs at this time [facility] is aware that patient is in hospital and will resume care after Discharge Patient uses [place] Patient has transportation provided by husband Patient has [place] as PCP    Action/Plan:   Patient has been provided a [facility] list per CMS.gov  Expected Discharge Date:                  Expected Discharge Plan:Home with Home health     In-House Referral:     Discharge planning Services  CM Consult  Post Acute Care Choice:    Choice offered to:     DME Arranged:    DME Agency:     HH Arranged:  PT [place]:  [place]  Status of Service:  In process, will continue to follow  If discussed at Long Length of Stay Meetings, dates discussed:    Additional Comments:  Su Hilt, RN 10/13/2018, 12:19 PM

## 2018-10-13 NOTE — Telephone Encounter (Signed)
Please call to confirm orders. Surgical wound care should come from surgeon. Please have agency reach out to them. thanks

## 2018-10-13 NOTE — Consult Note (Signed)
                                                                                 Consultation Note Date: 10/13/2018   Patient Name: [name]  DOB: 08/03/1956  MRN: [id number]  Age / Sex: 63 y.o., female  PCP: Santiago, Irma M, MD Referring Physician: Wieting, Richard, MD  Reason for Consultation: Establishing goals of care  HPI/Patient Profile: 63 y.o. female admitted on 10/12/2018 from home with complaints of shortness of breath.  She has a past medical history of left toe amputation, MSSA bacteremia (IV antibiotics), NSTEMI, osteomyelitis, chronic systolic heart failure (EF 20-25%), CAD, depression, OSA (CPAP), diabetes type II, obesity and hypertension.  Patient presented to ED with oxygen saturations 3% on room air, generalized weakness, and fatigue.  Next x-ray showed right-sided pneumonia, WBC 17.2, troponin 0.04, potassium 4.7. Since admission patient has received IV hydration. She continues on IV antibiotics (Maxipime) with Ancef on hold. She continues on home medications. Palliative Medicine team consulted for goals of care discussion.   Clinical Assessment and Goals of Care: I have reviewed medical records including lab results, imaging, Epic notes, and MAR, received report from the bedside RN, and assessed the patient. I met at the bedside with patient to discuss diagnosis prognosis, GOC, EOL wishes, disposition and options. She is A&O x3. Denies pain or discomfort.   I introduced Palliative Medicine as specialized medical care for people living with serious illness. It focuses on providing relief from the symptoms and stress of a serious illness. The goal is to improve quality of life for both the patient and the family.  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 She lives with her husband.   Prior to admission she reports being ambulatory with minimum  complications. She reports being independent of ADLs with assistance at times. She reports having home health services and IV antibiotics.   We discussed his current illness and what it means in the larger context of his on-going co-morbidities. I discussed specifically patient's chronic CHF, pneumonia, NSTEMI, and overall functional status. Natural disease trajectory and expectations at EOL were discussed. Patient verbalized understanding of her current illness and ongoing co-morbidities.   She expresses she remains hopeful for improvement but is somewhat preparing for the worst, although she hopes that is a long time from now.   I attempted to elicit values and goals of care important to the patient.    The difference between aggressive medical intervention and comfort care was considered in light of the patient's goals of care. Patient request to continue with current treatments and care. She remains hopeful.   We discussed long-term prognosis and care. Patient verbalized understanding and expressed "I will cross paths and situations as they come!" Support given.   Patient expressed that her husband is her documented medical decision maker in the event she is unable to make decisions. She confirms DNR/DNI and expresses her awareness of her condition and the impact that would occur with CPR and life support. Support given.   Hospice and Palliative Care   services outpatient were explained and offered. Given patient's wishes to continue with aggressive treatments and therapies such as IV antibiotics and home care, recommendation is outpatient palliative. Patient verbalized understanding and awareness of Palliative's goals and philosophy of care. She has requested for outpatient palliative services at discharge in addition to her home health services.   Questions and concerns were addressed. Patient was encouraged to call with questions or concerns.  PMT will continue to support  holistically.  Primary Decision Maker:  PATIENT    SUMMARY OF RECOMMENDATIONS    DNR/DNI-as confirmed by patient  Continue to treat the treatable while hospitalized  Patient expressed wishes to have outpatient palliative services at discharge in addition to current home health.   Case Manager referral for outpatient palliative  Palliative Medicine team will continue to support and follow   Code Status/Advance Care Planning:  DNR   Symptom Management:   Per attending  Palliative Prophylaxis:   Aspiration, Bowel Regimen, Frequent Pain Assessment and Turn Reposition  Additional Recommendations (Limitations, Scope, Preferences):  Full Scope Treatment-continue to treat   Psycho-social/Spiritual:   Desire for further Chaplaincy support:NO   Prognosis:   Guarded to Poor in the setting of HCAP, s/p left foot second toe amputation, MSSA bacteremia (6 weeks antibiotics), hyperlipidemia, MI, systolic CHF, hypertension, diabetes, obesity, and decreased mobility,   Discharge Planning: Home with Home Health and outpatient Palliative       Primary Diagnoses: Present on Admission: . HCAP (healthcare-associated pneumonia)   I have reviewed the medical record, interviewed the patient and family, and examined the patient. The following aspects are pertinent.  Past Medical History:  Diagnosis Date  . Acute myocardial infarction, subendocardial infarction, initial episode of care ([place]) 07/21/2012  . Acute osteomyelitis involving ankle and foot ([place]) 02/06/2015  . Acute systolic heart failure ([place]) 07/21/2012   New onset 07/19/12; admission to [facility] ED. Elevated Troponins.  S/p 2D-echo with EF 20-25%.  S/p cardiac catheterization with stenting LAD.  Repeat 2D-echo 10/2011 with improved EF of 35%.   . Anemia   . Automatic implantable cardioverter-defibrillator in situ    a. MDT CRT-D 06/2014, SN: [id number]    . CAD (coronary artery disease)    a. cardiac cath 101/04/2012:  PCI/DES to chronically occluded mLAD, consideration PCI to diag branch in 4 weeks.   . Cataract   . Chicken pox   . Chronic systolic CHF (congestive heart failure) (HCC)    a. mixed ICM & NICM; b. EF 20-25% by echo 07/2012, mid-dist 2/3 of LV sev HK/AK, mild MR. echo 10/2012: EF 30-35%, sev HK ant-septal & inf walls, GR1DD, mild MR, PASP 33. c. echo 02/2013: EF 30%, GR1DD, mild MR. echo 04/2014: EF 30%, Septal-lat dyssynchrony, global HK, inf AK, GR1DD, mild MR. d. echo 10/2014: EF50-55%, WM nl, GR1DD, septal mild paradox. e. echo 02/2015: EF 50-55%, wm   . Depression   . Heart attack ([place])   . Heel ulcer ([place]) 04/27/2015  . History of blood transfusion ~ 2011   "plasma; had neuropathy; couldn't walk"  . Hypertension   . LBBB (left bundle branch block)   . Neuromuscular disorder ([place])   . Neuropathy 2011  . Obesity, unspecified   . OSA on CPAP    Moderate with AHI 23/hr and now on CPAP at 16cm H2O.  Her DME is [facility]  . Pure hypercholesterolemia   . Type II diabetes mellitus ([place])   . Unspecified vitamin D deficiency    Social History   Socioeconomic  History  . Marital status: Married    Spouse name: Robert  . Number of children: 2  . Years of education: 12  . Highest education level: High school graduate  Occupational History  . Occupation: disabled    Employer: UNEMPLOYED    Comment: 03/2010 for peripheral neuropathy  . 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  Social Needs  . Financial resource strain: Not hard at all  . Food insecurity:    Worry: Never true    Inability: Never true  . Transportation needs:    Medical: No    Non-medical: No  Tobacco Use  . Smoking status: Never Smoker  . Smokeless tobacco: Never Used  Substance and Sexual Activity  . Alcohol use: No    Alcohol/week: 0.0 standard drinks  . Drug use: No  . Sexual activity: Never    Birth control/protection: Post-menopausal, Surgical  Lifestyle  . Physical activity:    Days per week: 0 days     Minutes per session: 0 min  . Stress: Rather much  Relationships  . Social connections:    Talks on phone: More than three times a week    Gets together: More than three times a week    Attends religious service: Never    Active member of club or organization: No    Attends meetings of clubs or organizations: Never    Relationship status: Married  Other Topics Concern  . Not on file  Social History Narrative   Marital status:Married x 45 yrs. Happily married, no abuse.       Children:  2 children (daughter 38, son 44). Two grandsons and 2 step grandchildren.  1 gg.      Lives: with husband, daughter, son-in-law, 2 grandsons, and 1 on the way.      Employment: disability for peripheral neuropathy 2012.  Previously had home daycare.      Tobacco: none      Alcohol: none      Drugs: none      Exercise: none. [employer] membership.      Pets: dog.      Always uses seat belts, smoke detectors in home.      No guns in the home.       Caffeine use: 2 cups coffee per day.       Nutrition: Well balanced diet.   Family History  Problem Relation Age of Onset  . Diabetes Mother   . Hypertension Mother   . Arthritis Mother        knees, lumbar DDD, cervical DDD  . Cancer Father        prostate,skin,lymphoma.  . Cancer Brother 57       bladder cancer; non-smoker  . Diabetes Maternal Grandmother   . Heart disease Maternal Grandmother   . COPD Maternal Grandmother   . Diabetes Paternal Grandmother   . Obesity Brother   . Diabetes Son   . Hypertension Son   . Cancer Maternal Grandfather   . Diabetes Paternal Grandfather    Scheduled Meds: . amiodarone  200 mg Oral BID  . aspirin EC  81 mg Oral Daily  . calcium-vitamin D  1 tablet Oral Daily  . carvedilol  12.5 mg Oral BID WC  . citalopram  20 mg Oral Daily  . clopidogrel  75 mg Oral Daily  . ferrous sulfate  325 mg Oral Daily  . heparin  5,000 Units Subcutaneous Q8H  . insulin aspart  0-20   Units Subcutaneous TID WC  . insulin  aspart  0-5 Units Subcutaneous QHS  . insulin glargine  20 Units Subcutaneous Daily  . lidocaine  1 patch Transdermal Q24H  . omega-3 acid ethyl esters  1 g Oral BID  . protein supplement shake  11 oz Oral BID BM  . rosuvastatin  40 mg Oral Daily  . sodium chloride flush  10-40 mL Intracatheter Q12H  . sodium chloride flush  3 mL Intravenous Q12H   Continuous Infusions: . sodium chloride    . sodium chloride 40 mL/hr at 10/13/18 1006  . [START ON 10/14/2018] ceFEPime (MAXIPIME) IV     PRN Meds:.sodium chloride, HYDROcodone-acetaminophen, nitroGLYCERIN, sodium chloride flush, sodium chloride flush Medications Prior to Admission:  Prior to Admission medications   Medication Sig Start Date End Date Taking? Authorizing Provider  amiodarone (PACERONE) 200 MG tablet Take 1 tablet (200 mg total) by mouth 2 (two) times daily. 10/09/18  Yes Salary, Montell D, MD  aspirin EC 81 MG EC tablet Take 1 tablet (81 mg total) by mouth daily. 10/10/18  Yes Salary, Montell D, MD  Calcium Carb-Cholecalciferol (CALCIUM 600 + D PO) Take 1 tablet by mouth 2 (two) times daily.   Yes [provider]  carvedilol (COREG) 12.5 MG tablet Take 1 tablet (12.5 mg total) by mouth 2 (two) times daily with a meal. 10/09/18  Yes Salary, Montell D, MD  ceFAZolin (ANCEF) 2-4 GM/100ML-% IVPB Inject 100 mLs (2 g total) into the vein every 8 (eight) hours. 10/09/18 11/10/18 Yes Salary, Montell D, MD  citalopram (CELEXA) 20 MG tablet Take 1 tablet (20 mg total) by mouth daily. 04/15/18  Yes Smith, Kristi M, MD  clopidogrel (PLAVIX) 75 MG tablet Take 1 tablet (75 mg total) by mouth daily. 10/10/18  Yes Salary, Montell D, MD  ferrous sulfate 325 (65 FE) MG tablet Take 325 mg by mouth daily.   Yes [provider]  glimepiride (AMARYL) 4 MG tablet Take 1 tablet (4 mg total) by mouth 2 (two) times daily with a meal. 04/15/18  Yes Smith, Kristi M, MD  HYDROcodone-acetaminophen (NORCO/VICODIN) 5-325 MG tablet Take 1-2 tablets by mouth  every 6 (six) hours as needed for severe pain. 10/09/18  Yes Salary, Montell D, MD  insulin glargine (LANTUS) 100 UNIT/ML injection Inject 0.2 mLs (20 Units total) into the skin daily. 10/10/18  Yes Salary, Montell D, MD  lidocaine (LIDODERM) 5 % Place 1 patch onto the skin daily. Remove & Discard patch within 12 hours or as directed by MD 10/09/18  Yes Salary, Montell D, MD  losartan (COZAAR) 25 MG tablet Take 1 tablet (25 mg total) by mouth daily. 10/10/18  Yes Salary, Montell D, MD  nitroGLYCERIN (NITROSTAT) 0.4 MG SL tablet Place 1 tablet (0.4 mg total) under the tongue every 5 (five) minutes as needed for chest pain. 10/09/18  Yes Gollan, Timothy J, MD  omega-3 acid ethyl esters (LOVAZA) 1 g capsule Take 1 g by mouth 2 (two) times daily.   Yes [provider]  rosuvastatin (CRESTOR) 40 MG tablet TAKE 1 TABLET BY MOUTH ONCE DAILY 12/16/17  Yes Gollan, Timothy J, MD  torsemide (DEMADEX) 20 MG tablet Take 1 tablet (20 mg total) by mouth daily. Patient taking differently: Take 20 mg by mouth at bedtime.  04/15/18  Yes Smith, Kristi M, MD  AMBULATORY NON FORMULARY MEDICATION 1 Units by Other route once. Medication Name: foot brace 11/15/15   Patel, Donika K, DO  ceFAZolin (ANCEF) IVPB Inject   2 g into the vein every 8 (eight) hours. Indication: MSSA bacteremia and toe infection Last Day of Therapy:  11/10/2018 Labs - Once weekly:  CBC/D and BMP, Labs - Every other week:  ESR and CRP Patient not taking: Reported on 10/12/2018 10/10/18 11/10/18  Salary, Montell D, MD  glucose blood (ONE TOUCH ULTRA TEST) test strip Check sugar three times daily dx: DMII uncontrolled insulin dependent with complications 10/20/17   Smith, Kristi M, MD  Insulin Pen Needle (B-D ULTRAFINE III SHORT PEN) 31G X 8 MM MISC USE AS DIRECTED WITH LANTUS; dx: DMII uncontrolled, insulin dependent, with complications 10/20/17   Smith, Kristi M, MD  protein supplement shake (PREMIER PROTEIN) LIQD Take 325 mLs (11 oz total) by mouth 2 (two) times  daily between meals. 10/09/18   Salary, Montell D, MD   No Known Allergies Review of Systems  Constitutional: Positive for activity change and fatigue.  Respiratory: Positive for shortness of breath.   Musculoskeletal: Positive for back pain.  Neurological: Positive for weakness.  All other systems reviewed and are negative.   Physical Exam Vitals signs and nursing note reviewed.  Constitutional:      Appearance: She is obese.     Comments: Chronically ill   Cardiovascular:     Rate and Rhythm: Normal rate.     Pulses: Normal pulses.     Heart sounds: Normal heart sounds.  Pulmonary:     Effort: Pulmonary effort is normal.     Breath sounds: Decreased breath sounds present.  Abdominal:     General: Bowel sounds are normal.     Palpations: Abdomen is soft.  Skin:    General: Skin is warm and dry.  Neurological:     Mental Status: She is alert and oriented to person, place, and time.  Psychiatric:        Attention and Perception: Attention normal.        Mood and Affect: Mood normal.        Speech: Speech normal.        Behavior: Behavior is cooperative.        Thought Content: Thought content normal.        Cognition and Memory: Cognition normal.        Judgment: Judgment normal.     Vital Signs: BP (!) 116/56 (BP Location: Left Arm)   Pulse 61   Temp 98.1 F (36.7 C) (Oral)   Resp 16   Ht 5' 8" (1.727 m)   Wt 134.5 kg   SpO2 96%   BMI 45.09 kg/m  Pain Scale: 0-10   Pain Score: 4    SpO2: SpO2: 96 % O2 Device:SpO2: 96 % O2 Flow Rate: .O2 Flow Rate (L/min): 1 L/min  IO: Intake/output summary:   Intake/Output Summary (Last 24 hours) at 10/13/2018 1610 Last data filed at 10/13/2018 1300 Gross per 24 hour  Intake 889.87 ml  Output -  Net 889.87 ml    LBM:   Baseline Weight: Weight: 129.3 kg Most recent weight: Weight: 134.5 kg     Palliative Assessment/Data: PPS 30%   Flowsheet Rows     Most Recent Value  Intake Tab  Referral Department  Hospitalist   Unit at Time of Referral  ER  Palliative Care Primary Diagnosis  Sepsis/Infectious Disease  Date Notified  10/13/18  Palliative Care Type  New Palliative care  Reason for referral  Clarify Goals of Care  Date of Admission  10/12/18  # of days IP prior to   Palliative referral  1  Clinical Assessment  Psychosocial & Spiritual Assessment  Palliative Care Outcomes      Time In: 1300 Time Out: 1415 Time Total: 75 min  Greater than 50%  of this time was spent counseling and coordinating care related to the above assessment and plan.  Signed by:  Alda Lea, AGPCNP-BC Palliative Medicine Team  Phone: [phone] Fax: [phone] Pager: [phone] Amion: Bjorn Pippin    Please contact Palliative Medicine Team phone at [phone] for questions and concerns.  For individual provider: See Shea Evans

## 2018-10-13 NOTE — Care Management (Signed)
Notified Dr. Leslye Peer that when the patient is ready for Discharge the patient will need a new RX for home infusion if it is needed.

## 2018-10-13 NOTE — Progress Notes (Addendum)
Patient ID: [name], female   DOB: [birth year], 63 y.o.   MRN: [id number]  [facility] PROGRESS NOTE  [name] WUJ:[id number] DOB: 07-23-1956 DOA: 10/12/2018 PCP: Rutherford Guys, MD  HPI/Subjective: Patient feels okay.  Had some chills the other night.  States she is eating and drinking okay.  Not much cough.  Objective: Vitals:   10/13/18 1206 10/13/18 1431  BP:  (!) 116/56  Pulse:  61  Resp:  16  Temp:  98.1 F (36.7 C)  SpO2: 95% 96%    Filed Weights   10/12/18 1516 10/13/18 0056  Weight: 129.3 kg 134.5 kg    ROS: Review of Systems  Constitutional: Negative for chills and fever.  Eyes: Negative for blurred vision.  Respiratory: Positive for cough. Negative for shortness of breath.   Cardiovascular: Negative for chest pain.  Gastrointestinal: Negative for abdominal pain, constipation, diarrhea, nausea and vomiting.  Genitourinary: Negative for dysuria.  Musculoskeletal: Positive for joint pain.  Neurological: Negative for dizziness and headaches.   Exam: Physical Exam  Constitutional: She is oriented to person, place, and time.  HENT:  Nose: No mucosal edema.  Mouth/Throat: No oropharyngeal exudate or posterior oropharyngeal edema.  Eyes: Pupils are equal, round, and reactive to light. Conjunctivae, EOM and lids are normal.  Neck: No JVD present. Carotid bruit is not present. No edema present. No thyroid mass and no thyromegaly present.  Cardiovascular: S1 normal and S2 normal. Exam reveals no gallop.  No murmur heard. Pulses:      Dorsalis pedis pulses are 2+ on the right side and 2+ on the left side.  Respiratory: No respiratory distress. She has no decreased breath sounds. She has no wheezes. She has no rhonchi. She has no rales.  GI: Soft. Bowel sounds are normal. There is no abdominal tenderness.  Musculoskeletal:     Right ankle: She exhibits swelling.     Left ankle: She exhibits swelling.  Lymphadenopathy:    She has no cervical  adenopathy.  Neurological: She is alert and oriented to person, place, and time. No cranial nerve deficit.  Skin: Skin is warm. No rash noted. Nails show no clubbing.  Psychiatric: She has a normal mood and affect.      Data Reviewed: Basic Metabolic Panel: Recent Labs  Lab 10/07/18 0351 10/09/18 0635 10/12/18 1517 10/13/18 0822  NA 131* 131* 132* 131*  K 3.7 3.8 4.7 4.0  CL 99 100 100 102  CO2 23 24 23  21*  GLUCOSE 221* 134* 268* 173*  BUN 24* 29* 39* 43*  CREATININE 1.46* 1.59* 1.98* 2.52*  CALCIUM 7.6* 7.5* 8.1* 7.7*   CBC: Recent Labs  Lab 10/07/18 0351 10/12/18 1517  WBC 13.3* 17.2*  HGB 9.8* 10.4*  HCT 30.2* 32.4*  MCV 89.9 90.3  PLT 187 225   Cardiac Enzymes: Recent Labs  Lab 10/12/18 1517 10/12/18 2037  TROPONINI 0.04* 0.03*   BNP (last 3 results) Recent Labs    02/24/18 2021 09/29/18 0217  BNP 170.1* 1,758.0*     CBG: Recent Labs  Lab 10/09/18 1112 10/13/18 0228 10/13/18 0832 10/13/18 0949 10/13/18 1130  GLUCAP 251* 217* 158* 192* 232*       Studies: Dg Chest 2 View  Result Date: 10/12/2018 CLINICAL DATA:  Shortness of breath. EXAM: CHEST - 2 VIEW COMPARISON:  Radiograph of September 30, 2018. FINDINGS: Stable cardiomediastinal silhouette. Left-sided pacemaker is unchanged in position. Atherosclerosis of abdominal aorta is noted. Left lung is clear. No pneumothorax is noted.  New right upper lobe airspace opacity is noted consistent with pneumonia. Right-sided PICC line is noted with distal tip in expected position of SVC. Small right pleural effusion is noted. Bony thorax is unremarkable. IMPRESSION: Large right upper lobe airspace opacity is noted consistent with pneumonia. Followup PA and lateral chest X-ray is recommended in 3-4 weeks following trial of antibiotic therapy to ensure resolution and exclude underlying malignancy. Right-sided PICC line is noted in good position. Small right pleural effusion is noted. Aortic Atherosclerosis  (ICD10-I70.0). Electronically Signed   By: Marijo Conception, M.D.   On: 10/12/2018 16:08    Scheduled Meds: . amiodarone  200 mg Oral BID  . aspirin EC  81 mg Oral Daily  . calcium-vitamin D  1 tablet Oral Daily  . carvedilol  12.5 mg Oral BID WC  . citalopram  20 mg Oral Daily  . clopidogrel  75 mg Oral Daily  . ferrous sulfate  325 mg Oral Daily  . heparin  5,000 Units Subcutaneous Q8H  . insulin aspart  0-20 Units Subcutaneous TID WC  . insulin aspart  0-5 Units Subcutaneous QHS  . insulin glargine  20 Units Subcutaneous Daily  . lidocaine  1 patch Transdermal Q24H  . omega-3 acid ethyl esters  1 g Oral BID  . protein supplement shake  11 oz Oral BID BM  . rosuvastatin  40 mg Oral Daily  . sodium chloride flush  10-40 mL Intracatheter Q12H  . sodium chloride flush  3 mL Intravenous Q12H   Continuous Infusions: . sodium chloride    . sodium chloride 40 mL/hr at 10/13/18 1006  . [START ON 10/14/2018] ceFEPime (MAXIPIME) IV      Assessment/Plan:  1. Acute kidney injury.  Gentle IV fluid hydration.  Hold torsemide and losartan.  Also hold glipizide.  Check creatinine daily. 2. Right upper lobe pneumonia.  On Maxipime. 3. Recent MSSA bacteremia on 6 weeks of IV Ancef.  Ancef on hold while on Maxipime.  Can go back on Ancef and finish up course upon discharge.  Patient to have antibiotics IV through November 10, 2018. 4. Recent NSTEMI on aspirin Plavix and beta-blocker and Crestor 5. Type 2 diabetes mellitus.  Hold Amaryl.  Put on sliding scale and continue glargine insulin. 6. History of chronic systolic heart failure.  Watch closely with gentle IV fluids. 7. Hyperlipidemia unspecified on Crestor  Code Status:     Code Status Orders  (From admission, onward)         Start     Ordered   10/13/18 0048  Do not attempt resuscitation (DNR)  Continuous    Question Answer Comment  In the event of cardiac or respiratory ARREST Do not call a "code blue"   In the event of cardiac or  respiratory ARREST Do not perform Intubation, CPR, defibrillation or ACLS   In the event of cardiac or respiratory ARREST Use medication by any route, position, wound care, and other measures to relive pain and suffering. May use oxygen, suction and manual treatment of airway obstruction as needed for comfort.   Comments Nurse may pronounce      10/13/18 0047        Code Status History    Date Active Date Inactive Code Status Order ID Comments User Context   09/28/2018 1456 10/09/2018 1726 Full Code [id number]  Nicholes Mango, MD ED   11/11/2015 1411 11/12/2015 1635 Full Code [id number]  Vaughan Basta, MD Inpatient   06/17/2015 0148 06/19/2015 1659 Full  Code [id number]  Harrie Foreman, MD Inpatient   02/17/2015 1330 02/24/2015 1925 DNR [id number]  Bettey Costa, MD ED   07/06/2014 1100 07/07/2014 1524 Full Code [id number]  Deboraha Sprang, MD Inpatient   07/22/2012 1427 07/26/2012 1507 DNR [id number]  Nolon Rod, DO Inpatient     Disposition Plan: To be determined  Antibiotics:  Cefepime  Time spent: 28 minutes  [place]

## 2018-10-13 NOTE — Progress Notes (Signed)
Pt stated that she has not worn cpap machine in a few years and has declined to bring her's in or wear ours.

## 2018-10-14 LAB — URINALYSIS, COMPLETE (UACMP) WITH MICROSCOPIC
Bacteria, UA: NONE SEEN
Bilirubin Urine: NEGATIVE
Glucose, UA: NEGATIVE mg/dL
Ketones, ur: NEGATIVE mg/dL
Leukocytes, UA: NEGATIVE
Nitrite: NEGATIVE
Protein, ur: 30 mg/dL — AB
Specific Gravity, Urine: 1.006 (ref 1.005–1.030)
pH: 5 (ref 5.0–8.0)

## 2018-10-14 LAB — BASIC METABOLIC PANEL
Anion gap: 8 (ref 5–15)
BUN: 49 mg/dL — ABNORMAL HIGH (ref 8–23)
CO2: 22 mmol/L (ref 22–32)
Calcium: 7.7 mg/dL — ABNORMAL LOW (ref 8.9–10.3)
Chloride: 104 mmol/L (ref 98–111)
Creatinine, Ser: 2.47 mg/dL — ABNORMAL HIGH (ref 0.44–1.00)
GFR calc Af Amer: 23 mL/min — ABNORMAL LOW (ref >60)
GFR calc non Af Amer: 20 mL/min — ABNORMAL LOW (ref >60)
GLUCOSE: 111 mg/dL — AB (ref 70–99)
Potassium: 4.4 mmol/L (ref 3.5–5.1)
Sodium: 134 mmol/L — ABNORMAL LOW (ref 135–145)

## 2018-10-14 LAB — HIV ANTIBODY (ROUTINE TESTING W REFLEX): HIV Screen 4th Generation wRfx: NONREACTIVE

## 2018-10-14 LAB — VANCOMYCIN, RANDOM: Vancomycin Rm: 15

## 2018-10-14 LAB — GLUCOSE, CAPILLARY
Glucose-Capillary: 83 mg/dL (ref 70–99)
Glucose-Capillary: 168 mg/dL — ABNORMAL HIGH (ref 70–99)
Glucose-Capillary: 190 mg/dL — ABNORMAL HIGH (ref 70–99)
Glucose-Capillary: 201 mg/dL — ABNORMAL HIGH (ref 70–99)

## 2018-10-14 LAB — STREP PNEUMONIAE URINARY ANTIGEN: STREP PNEUMO URINARY ANTIGEN: NEGATIVE

## 2018-10-14 MED ORDER — INSULIN GLARGINE 100 UNIT/ML ~~LOC~~ SOLN
16.0000 [IU] | Freq: Every day | SUBCUTANEOUS | Status: DC
  Administered 2018-10-16: 16 [IU] via SUBCUTANEOUS
  Filled 2018-10-14 (×3): qty 0.16

## 2018-10-14 MED ORDER — VANCOMYCIN HCL 10 G IV SOLR
1250.0000 mg | Freq: Once | INTRAVENOUS | Status: AC
  Administered 2018-10-14: 1250 mg via INTRAVENOUS
  Filled 2018-10-14 (×2): qty 1250

## 2018-10-14 NOTE — Progress Notes (Signed)
Patient ID: RYELEE [name], female   DOB: 08-May-1956, 63 y.o.   MRN: [id number]  [facility] PROGRESS NOTE  [name] [id number]:[id number] DOB: [birth year] DOA: 10/12/2018 PCP: Rutherford Guys, MD  HPI/Subjective:  Afebrile. No SOB. Minimal cough. Sitting in a chair. Good appetite  Objective: Vitals:   10/14/18 0750 10/14/18 1043  BP: (!) 153/59   Pulse: 86 66  Resp: 18   Temp: 98.9 F (37.2 C)   SpO2: 96% 95%    Filed Weights   10/12/18 1516 10/13/18 0056  Weight: 129.3 kg 134.5 kg    ROS: Review of Systems  Constitutional: Negative for chills and fever.  Eyes: Negative for blurred vision.  Respiratory: Positive for cough. Negative for shortness of breath.   Cardiovascular: Negative for chest pain.  Gastrointestinal: Negative for abdominal pain, constipation, diarrhea, nausea and vomiting.  Genitourinary: Negative for dysuria.  Musculoskeletal: Positive for joint pain.  Neurological: Negative for dizziness and headaches.   Exam: Physical Exam  Constitutional: She is oriented to person, place, and time.  HENT:  Nose: No mucosal edema.  Mouth/Throat: No oropharyngeal exudate or posterior oropharyngeal edema.  Eyes: Pupils are equal, round, and reactive to light. Conjunctivae, EOM and lids are normal.  Neck: No JVD present. Carotid bruit is not present. No edema present. No thyroid mass and no thyromegaly present.  Cardiovascular: S1 normal and S2 normal. Exam reveals no gallop.  No murmur heard. Pulses:      Dorsalis pedis pulses are 2+ on the right side and 2+ on the left side.  Respiratory: No respiratory distress. She has no decreased breath sounds. She has no wheezes. She has no rhonchi. She has no rales.  GI: Soft. Bowel sounds are normal. There is no abdominal tenderness.  Musculoskeletal:     Right ankle: She exhibits swelling.     Left ankle: She exhibits swelling.  Lymphadenopathy:    She has no cervical adenopathy.  Neurological: She is alert and  oriented to person, place, and time. No cranial nerve deficit.  Skin: Skin is warm. No rash noted. Nails show no clubbing.  Psychiatric: She has a normal mood and affect.      Data Reviewed: Basic Metabolic Panel: Recent Labs  Lab 10/09/18 0635 10/12/18 1517 10/13/18 0822 10/14/18 0450  NA 131* 132* 131* 134*  K 3.8 4.7 4.0 4.4  CL 100 100 102 104  CO2 24 23 21* 22  GLUCOSE 134* 268* 173* 111*  BUN 29* 39* 43* 49*  CREATININE 1.59* 1.98* 2.52* 2.47*  CALCIUM 7.5* 8.1* 7.7* 7.7*   CBC: Recent Labs  Lab 10/12/18 1517  WBC 17.2*  HGB 10.4*  HCT 32.4*  MCV 90.3  PLT 225   Cardiac Enzymes: Recent Labs  Lab 10/12/18 1517 10/12/18 2037  TROPONINI 0.04* 0.03*   BNP (last 3 results) Recent Labs    02/24/18 2021 09/29/18 0217  BNP 170.1* 1,758.0*     CBG: Recent Labs  Lab 10/13/18 1130 10/13/18 1658 10/13/18 2115 10/14/18 0752 10/14/18 1149  GLUCAP 232* 164* 167* 83 168*       Studies: Dg Chest 2 View  Result Date: 10/12/2018 CLINICAL DATA:  Shortness of breath. EXAM: CHEST - 2 VIEW COMPARISON:  Radiograph of September 30, 2018. FINDINGS: Stable cardiomediastinal silhouette. Left-sided pacemaker is unchanged in position. Atherosclerosis of abdominal aorta is noted. Left lung is clear. No pneumothorax is noted. New right upper lobe airspace opacity is noted consistent with pneumonia. Right-sided PICC line is noted with  distal tip in expected position of SVC. Small right pleural effusion is noted. Bony thorax is unremarkable. IMPRESSION: Large right upper lobe airspace opacity is noted consistent with pneumonia. Followup PA and lateral chest X-ray is recommended in 3-4 weeks following trial of antibiotic therapy to ensure resolution and exclude underlying malignancy. Right-sided PICC line is noted in good position. Small right pleural effusion is noted. Aortic Atherosclerosis (ICD10-I70.0). Electronically Signed   By: Marijo Conception, M.D.   On: 10/12/2018 16:08     Scheduled Meds: . amiodarone  200 mg Oral BID  . aspirin EC  81 mg Oral Daily  . calcium-vitamin D  1 tablet Oral Daily  . carvedilol  12.5 mg Oral BID WC  . citalopram  20 mg Oral Daily  . clopidogrel  75 mg Oral Daily  . ferrous sulfate  325 mg Oral Daily  . heparin  5,000 Units Subcutaneous Q8H  . insulin aspart  0-20 Units Subcutaneous TID WC  . insulin aspart  0-5 Units Subcutaneous QHS  . [START ON 10/15/2018] insulin glargine  16 Units Subcutaneous Daily  . lidocaine  1 patch Transdermal Q24H  . omega-3 acid ethyl esters  1 g Oral BID  . protein supplement shake  11 oz Oral BID BM  . rosuvastatin  40 mg Oral Daily  . sodium chloride flush  10-40 mL Intracatheter Q12H  . sodium chloride flush  3 mL Intravenous Q12H   Continuous Infusions: . sodium chloride    . sodium chloride 40 mL/hr at 10/14/18 1043  . ceFEPime (MAXIPIME) IV 2 g (10/14/18 7371)    Assessment/Plan:  1. Acute kidney injury.  Continue IV fluids.  Hold torsemide and losartan.  Also hold glipizide.  Check creatinine daily.  Monitor input and output.  Will check urine analysis. 2. Right upper lobe pneumonia.  On Maxipime. 3. Recent MSSA bacteremia on 6 weeks of IV Ancef.  Ancef on hold while on Maxipime.  Can go back on Ancef and finish up course upon discharge.  Patient to have antibiotics IV through November 10, 2018. 4. Recent NSTEMI on aspirin Plavix and beta-blocker and Crestor 5. Type 2 diabetes mellitus.  Hold Amaryl.  Put on sliding scale and continue glargine insulin. 6. History of chronic systolic heart failure.  Watch closely with gentle IV fluids. 7. Hyperlipidemia unspecified on Crestor  Code Status:     Code Status Orders  (From admission, onward)         Start     Ordered   10/13/18 0048  Do not attempt resuscitation (DNR)  Continuous    Question Answer Comment  In the event of cardiac or respiratory ARREST Do not call a "code blue"   In the event of cardiac or respiratory ARREST Do  not perform Intubation, CPR, defibrillation or ACLS   In the event of cardiac or respiratory ARREST Use medication by any route, position, wound care, and other measures to relive pain and suffering. May use oxygen, suction and manual treatment of airway obstruction as needed for comfort.   Comments Nurse may pronounce      10/13/18 0047        Code Status History    Date Active Date Inactive Code Status Order ID Comments User Context   09/28/2018 1456 10/09/2018 1726 Full Code [id number]  Nicholes Mango, MD ED   11/11/2015 1411 11/12/2015 1635 Full Code [id number]  Vaughan Basta, MD Inpatient   06/17/2015 0148 06/19/2015 1659 Full Code [id number]  Harrie Foreman,  MD Inpatient   02/17/2015 1330 02/24/2015 1925 DNR [id number]  Bettey Costa, MD ED   07/06/2014 1100 07/07/2014 1524 Full Code [id number]  Deboraha Sprang, MD Inpatient   07/22/2012 1427 07/26/2012 1507 DNR [id number]  Nolon Rod, DO Inpatient     Disposition Plan: To be determined  Antibiotics:  Cefepime  Time spent: 35 minutes  [place]

## 2018-10-14 NOTE — Consult Note (Addendum)
[facility] Nurse wound consult note Reason for Consult: Consult requested for left foot.  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 Wound type: Full thickness post-op incision; sutures are intact and well-approximated; no odor, drainage, fluctuance, or erythemia Dressing procedure/placement/frequency: Continue present plan of care with dry gauze over the incision site and kerlex to hold in place 3 times a week.  Pt has post-op walking shoe at the bedside and plans to followup with the ortho service after discharge. Please re-consult if further assistance is needed.  Thank-you,  Julien Girt MSN, [place], [place], [place], [place]

## 2018-10-14 NOTE — Progress Notes (Signed)
Pharmacy Antibiotic Note  [name] is a 63 y.o. female admitted on 10/12/2018 with pneumonia.  Pharmacy has been consulted for Vancomycin dosing.  Plan: Random vancomycin level returned 15 mcg/ml. Give additional 1250 mg IV x 1 now and follow renal function for subsequent dosing.    Continue cefepime 2 gm IV Q24H based on CrCl 30 to 60 mL/min.   Height: 5\' 8"  (172.7 cm) Weight: 296 lb 8.3 oz (134.5 kg) IBW/kg (Calculated) : 63.9  Temp (24hrs), Avg:98.6 F (37 C), Min:98.3 F (36.8 C), Max:98.9 F (37.2 C)  Recent Labs  Lab 10/09/18 0635 10/12/18 1517 10/13/18 0822 10/14/18 0450 10/14/18 1446  WBC  --  17.2*  --   --   --   CREATININE 1.59* 1.98* 2.52* 2.47*  --   VANCORANDOM  --   --   --   --  15    Estimated Creatinine Clearance: 33.5 mL/min (A) (by C-G formula based on SCr of 2.47 mg/dL (H)).    No Known Allergies  Antimicrobials this admission:   >>    >>   Dose adjustments this admission:   Microbiology results:  BCx:  UCx:    Sputum:    MRSA PCR:   Thank you for allowing pharmacy to be a part of this patient's care.  Laural Benes, PharmD, BCPS Clinical Pharmacist 10/14/2018 3:21 PM

## 2018-10-14 NOTE — Progress Notes (Signed)
Pt c/o feeling short of breath and requests 02 be put back on. 02 sat 92% on room air. No change in breath sounds. 02 placed at 1L via nasal cannula. Pt encouraged to cough and deep breath, use IS. Pt verbalizes understanding. Pt states breathing improved with 02 on. 02 sat 95%.

## 2018-10-15 LAB — BASIC METABOLIC PANEL
Anion gap: 5 (ref 5–15)
BUN: 48 mg/dL — ABNORMAL HIGH (ref 8–23)
CO2: 22 mmol/L (ref 22–32)
Calcium: 7.8 mg/dL — ABNORMAL LOW (ref 8.9–10.3)
Chloride: 107 mmol/L (ref 98–111)
Creatinine, Ser: 1.81 mg/dL — ABNORMAL HIGH (ref 0.44–1.00)
GFR calc Af Amer: 34 mL/min — ABNORMAL LOW (ref >60)
GFR, EST NON AFRICAN AMERICAN: 29 mL/min — AB (ref >60)
GLUCOSE: 103 mg/dL — AB (ref 70–99)
Potassium: 4.6 mmol/L (ref 3.5–5.1)
Sodium: 134 mmol/L — ABNORMAL LOW (ref 135–145)

## 2018-10-15 LAB — GLUCOSE, CAPILLARY
GLUCOSE-CAPILLARY: 158 mg/dL — AB (ref 70–99)
Glucose-Capillary: 92 mg/dL (ref 70–99)
Glucose-Capillary: 147 mg/dL — ABNORMAL HIGH (ref 70–99)
Glucose-Capillary: 185 mg/dL — ABNORMAL HIGH (ref 70–99)

## 2018-10-15 LAB — CBC WITH DIFFERENTIAL/PLATELET
Abs Immature Granulocytes: 0.03 10*3/uL (ref 0.00–0.07)
Basophils Absolute: 0.1 10*3/uL (ref 0.0–0.1)
Basophils Relative: 1 %
Eosinophils Absolute: 0.1 10*3/uL (ref 0.0–0.5)
Eosinophils Relative: 1 %
HCT: 24.4 % — ABNORMAL LOW (ref 36.0–46.0)
Hemoglobin: 7.8 g/dL — ABNORMAL LOW (ref 12.0–15.0)
IMMATURE GRANULOCYTES: 0 %
Lymphocytes Relative: 18 %
Lymphs Abs: 1.3 10*3/uL (ref 0.7–4.0)
MCH: 28.9 pg (ref 26.0–34.0)
MCHC: 32 g/dL (ref 30.0–36.0)
MCV: 90.4 fL (ref 80.0–100.0)
Monocytes Absolute: 0.6 10*3/uL (ref 0.1–1.0)
Monocytes Relative: 8 %
Neutro Abs: 5.3 10*3/uL (ref 1.7–7.7)
Neutrophils Relative %: 72 %
Platelets: 198 10*3/uL (ref 150–400)
RBC: 2.7 MIL/uL — ABNORMAL LOW (ref 3.87–5.11)
RDW: 13.2 % (ref 11.5–15.5)
WBC: 7.3 10*3/uL (ref 4.0–10.5)
nRBC: 0 % (ref 0.0–0.2)

## 2018-10-15 LAB — LEGIONELLA PNEUMOPHILA SEROGP 1 UR AG: L. pneumophila Serogp 1 Ur Ag: NEGATIVE

## 2018-10-15 LAB — FERRITIN: Ferritin: 501 ng/mL — ABNORMAL HIGH (ref 11–307)

## 2018-10-15 MED ORDER — TORSEMIDE 20 MG PO TABS
20.0000 mg | ORAL_TABLET | Freq: Every day | ORAL | Status: DC
  Administered 2018-10-15: 20 mg via ORAL
  Filled 2018-10-15 (×2): qty 1

## 2018-10-15 NOTE — Plan of Care (Signed)
  Problem: Education: Goal: Knowledge of General Education information will improve Description Including pain rating scale, medication(s)/side effects and non-pharmacologic comfort measures Outcome: Progressing Goal: Knowledge of General Education information will improve Description Including pain rating scale, medication(s)/side effects and non-pharmacologic comfort measures Outcome: Progressing   Problem: Health Behavior/Discharge Planning: Goal: Ability to manage health-related needs will improve Outcome: Progressing   Problem: Clinical Measurements: Goal: Ability to maintain clinical measurements within normal limits will improve Outcome: Progressing Goal: Will remain free from infection Outcome: Progressing Goal: Diagnostic test results will improve Outcome: Progressing Goal: Respiratory complications will improve Outcome: Progressing Goal: Cardiovascular complication will be avoided Outcome: Progressing   Problem: Activity: Goal: Risk for activity intolerance will decrease Outcome: Progressing   Problem: Nutrition: Goal: Adequate nutrition will be maintained Outcome: Progressing   Problem: Coping: Goal: Level of anxiety will decrease Outcome: Progressing   Problem: Elimination: Goal: Will not experience complications related to bowel motility Outcome: Progressing Goal: Will not experience complications related to urinary retention Outcome: Progressing   Problem: Pain Managment: Goal: General experience of comfort will improve Outcome: Progressing   Problem: Safety: Goal: Ability to remain free from injury will improve Outcome: Progressing   Problem: Skin Integrity: Goal: Risk for impaired skin integrity will decrease Outcome: Progressing   Problem: Activity: Goal: Ability to tolerate increased activity will improve Outcome: Progressing   Problem: Clinical Measurements: Goal: Ability to maintain a body temperature in the normal range will  improve Outcome: Progressing   Problem: Respiratory: Goal: Ability to maintain adequate ventilation will improve Outcome: Progressing Goal: Ability to maintain a clear airway will improve Outcome: Progressing   Problem: Safety: Goal: Ability to remain free from injury will improve Outcome: Progressing

## 2018-10-15 NOTE — Progress Notes (Signed)
Pt refused CPAP

## 2018-10-15 NOTE — Progress Notes (Signed)
PHARMACY CONSULT NOTE FOR:  OUTPATIENT  PARENTERAL ANTIBIOTIC THERAPY (OPAT)  Indication: MSSA bacteremia/toe infection Regimen: Cefazolin 2 gm IV Q8H End date: 11/10/2018  IV antibiotic discharge orders are pended. To discharging provider:  please sign these orders via discharge navigator,  Select New Orders & click on the button choice - Manage This Unsigned Work.     Thank you for allowing pharmacy to be a part of this patient's care.  Laural Benes 10/15/2018, 11:24 AM

## 2018-10-15 NOTE — Progress Notes (Signed)
Pt placed on overnight pulse ox on RA per order

## 2018-10-15 NOTE — Plan of Care (Signed)
Discussed with patient and family plan of care for the evening, pain management and receiving bath tonight with some teach back displayed.  Talked about protection creams and mositure associated skin damage to perineal and groin areas.

## 2018-10-15 NOTE — Progress Notes (Signed)
Patient ID: [name], female   DOB: 1956/05/09, 63 y.o.   MRN: [id number]  [facility] PROGRESS NOTE  Mirinda Monte Geraldo OEU:[id number] DOB: [birth year] DOA: 10/12/2018 PCP: Rutherford Guys, MD  HPI/Subjective: Patient got shortness of breath last night when lying flat.  Required oxygen for drop in pulse ox of 88%.  This morning feeling a little bit better.  Objective: Vitals:   10/14/18 2322 10/15/18 0833  BP: (!) 153/64 (!) 163/70  Pulse: 60 87  Resp: 16   Temp: 98.4 F (36.9 C) 98.3 F (36.8 C)  SpO2: 95% 96%    Filed Weights   10/12/18 1516 10/13/18 0056 10/15/18 0500  Weight: 129.3 kg 134.5 kg (!) 139.1 kg    ROS: Review of Systems  Constitutional: Negative for chills and fever.  Eyes: Negative for blurred vision.  Respiratory: Positive for cough and shortness of breath.   Cardiovascular: Negative for chest pain.  Gastrointestinal: Negative for abdominal pain, constipation, diarrhea, nausea and vomiting.  Genitourinary: Negative for dysuria.  Musculoskeletal: Positive for joint pain.  Neurological: Negative for dizziness and headaches.   Exam: Physical Exam  Constitutional: She is oriented to person, place, and time.  HENT:  Nose: No mucosal edema.  Mouth/Throat: No oropharyngeal exudate or posterior oropharyngeal edema.  Eyes: Pupils are equal, round, and reactive to light. Conjunctivae, EOM and lids are normal.  Neck: No JVD present. Carotid bruit is not present. No edema present. No thyroid mass and no thyromegaly present.  Cardiovascular: S1 normal and S2 normal. Exam reveals no gallop.  No murmur heard. Pulses:      Dorsalis pedis pulses are 2+ on the right side and 2+ on the left side.  Respiratory: No respiratory distress. She has no decreased breath sounds. She has no wheezes. She has no rhonchi. She has no rales.  GI: Soft. Bowel sounds are normal. There is no abdominal tenderness.  Musculoskeletal:     Right ankle: She exhibits swelling.   Left ankle: She exhibits swelling.  Lymphadenopathy:    She has no cervical adenopathy.  Neurological: She is alert and oriented to person, place, and time. No cranial nerve deficit.  Skin: Skin is warm. No rash noted. Nails show no clubbing.  Psychiatric: She has a normal mood and affect.      Data Reviewed: Basic Metabolic Panel: Recent Labs  Lab 10/09/18 0635 10/12/18 1517 10/13/18 0822 10/14/18 0450 10/15/18 0457  NA 131* 132* 131* 134* 134*  K 3.8 4.7 4.0 4.4 4.6  CL 100 100 102 104 107  CO2 24 23 21* 22 22  GLUCOSE 134* 268* 173* 111* 103*  BUN 29* 39* 43* 49* 48*  CREATININE 1.59* 1.98* 2.52* 2.47* 1.81*  CALCIUM 7.5* 8.1* 7.7* 7.7* 7.8*   CBC: Recent Labs  Lab 10/12/18 1517 10/15/18 0457  WBC 17.2* 7.3  NEUTROABS  --  5.3  HGB 10.4* 7.8*  HCT 32.4* 24.4*  MCV 90.3 90.4  PLT 225 198   Cardiac Enzymes: Recent Labs  Lab 10/12/18 1517 10/12/18 2037  TROPONINI 0.04* 0.03*   BNP (last 3 results) Recent Labs    02/24/18 2021 09/29/18 0217  BNP 170.1* 1,758.0*     CBG: Recent Labs  Lab 10/14/18 1149 10/14/18 1640 10/14/18 2131 10/15/18 0834 10/15/18 1156  GLUCAP 168* 190* 201* 92 158*      Scheduled Meds: . amiodarone  200 mg Oral BID  . aspirin EC  81 mg Oral Daily  . calcium-vitamin D  1 tablet  Oral Daily  . carvedilol  12.5 mg Oral BID WC  . citalopram  20 mg Oral Daily  . clopidogrel  75 mg Oral Daily  . ferrous sulfate  325 mg Oral Daily  . heparin  5,000 Units Subcutaneous Q8H  . insulin aspart  0-20 Units Subcutaneous TID WC  . insulin aspart  0-5 Units Subcutaneous QHS  . insulin glargine  16 Units Subcutaneous Daily  . lidocaine  1 patch Transdermal Q24H  . omega-3 acid ethyl esters  1 g Oral BID  . protein supplement shake  11 oz Oral BID BM  . rosuvastatin  40 mg Oral Daily  . sodium chloride flush  10-40 mL Intracatheter Q12H  . sodium chloride flush  3 mL Intravenous Q12H  . torsemide  20 mg Oral Daily   Continuous  Infusions: . sodium chloride    . ceFEPime (MAXIPIME) IV 2 g (10/15/18 0949)    Assessment/Plan:  1. Shortness of breath with hypoxia.  Overnight oximetry.  Stop IV fluids and start torsemide.   2. Acute kidney injury.  Creatinine improved but not back to baseline.  Hold losartan.  Also hold glipizide.  Restart torsemide. 3. Right upper lobe pneumonia.  On Maxipime. 4. Recent MSSA bacteremia on 6 weeks of IV Ancef.  Ancef on hold while on Maxipime.  Can go back on Ancef and finish up course upon discharge.  Patient to have antibiotics IV through November 10, 2018. 5. Drop in hemoglobin.  Start iron.  Guaiac stool.  Ferritin elevated goes along with anemia of chronic disease. 6. Recent NSTEMI on aspirin Plavix and beta-blocker and Crestor 7. Type 2 diabetes mellitus.  Hold Amaryl.  Put on sliding scale and continue glargine insulin. 8. History of chronic systolic heart failure.  Restart torsemide. 9. Hyperlipidemia unspecified on Crestor  Code Status:     Code Status Orders  (From admission, onward)         Start     Ordered   10/13/18 0048  Do not attempt resuscitation (DNR)  Continuous    Question Answer Comment  In the event of cardiac or respiratory ARREST Do not call a "code blue"   In the event of cardiac or respiratory ARREST Do not perform Intubation, CPR, defibrillation or ACLS   In the event of cardiac or respiratory ARREST Use medication by any route, position, wound care, and other measures to relive pain and suffering. May use oxygen, suction and manual treatment of airway obstruction as needed for comfort.   Comments Nurse may pronounce      10/13/18 0047        Code Status History    Date Active Date Inactive Code Status Order ID Comments User Context   09/28/2018 1456 10/09/2018 1726 Full Code [id number]  Nicholes Mango, MD ED   11/11/2015 1411 11/12/2015 1635 Full Code [id number]  Vaughan Basta, MD Inpatient   06/17/2015 0148 06/19/2015 1659 Full Code [id number]   Harrie Foreman, MD Inpatient   02/17/2015 1330 02/24/2015 1925 DNR [id number]  Bettey Costa, MD ED   07/06/2014 1100 07/07/2014 1524 Full Code [id number]  Deboraha Sprang, MD Inpatient   07/22/2012 1427 07/26/2012 1507 DNR [id number]  Nolon Rod, DO Inpatient     Disposition Plan: To be determined  Antibiotics:  Cefepime  Time spent: 27 minutes  [place]

## 2018-10-15 NOTE — Care Management Important Message (Signed)
Important Message  Patient Details  Name: [name] MRN: [id number] Date of Birth: [birth year]   Medicare Important Message Given:  Yes    Juliann Pulse A Franca Stakes 10/15/2018, 11:50 AM

## 2018-10-16 DIAGNOSIS — E1169 Type 2 diabetes mellitus with other specified complication: Secondary | ICD-10-CM | POA: Diagnosis not present

## 2018-10-16 DIAGNOSIS — I5022 Chronic systolic (congestive) heart failure: Secondary | ICD-10-CM | POA: Diagnosis not present

## 2018-10-16 DIAGNOSIS — I447 Left bundle-branch block, unspecified: Secondary | ICD-10-CM | POA: Diagnosis not present

## 2018-10-16 DIAGNOSIS — J181 Lobar pneumonia, unspecified organism: Secondary | ICD-10-CM | POA: Diagnosis not present

## 2018-10-16 DIAGNOSIS — M86172 Other acute osteomyelitis, left ankle and foot: Secondary | ICD-10-CM | POA: Diagnosis not present

## 2018-10-16 DIAGNOSIS — N189 Chronic kidney disease, unspecified: Secondary | ICD-10-CM | POA: Diagnosis not present

## 2018-10-16 DIAGNOSIS — E1122 Type 2 diabetes mellitus with diabetic chronic kidney disease: Secondary | ICD-10-CM | POA: Diagnosis not present

## 2018-10-16 LAB — GLUCOSE, CAPILLARY
Glucose-Capillary: 150 mg/dL — ABNORMAL HIGH (ref 70–99)
Glucose-Capillary: 182 mg/dL — ABNORMAL HIGH (ref 70–99)

## 2018-10-16 LAB — BASIC METABOLIC PANEL
Anion gap: 6 (ref 5–15)
BUN: 38 mg/dL — ABNORMAL HIGH (ref 8–23)
CALCIUM: 8.1 mg/dL — AB (ref 8.9–10.3)
CO2: 23 mmol/L (ref 22–32)
Chloride: 107 mmol/L (ref 98–111)
Creatinine, Ser: 1.52 mg/dL — ABNORMAL HIGH (ref 0.44–1.00)
GFR calc non Af Amer: 36 mL/min — ABNORMAL LOW (ref >60)
GFR, EST AFRICAN AMERICAN: 42 mL/min — AB (ref >60)
Glucose, Bld: 154 mg/dL — ABNORMAL HIGH (ref 70–99)
Potassium: 4.7 mmol/L (ref 3.5–5.1)
Sodium: 136 mmol/L (ref 135–145)

## 2018-10-16 LAB — HEMOGLOBIN: Hemoglobin: 8 g/dL — ABNORMAL LOW (ref 12.0–15.0)

## 2018-10-16 LAB — OCCULT BLOOD X 1 CARD TO LAB, STOOL: Fecal Occult Bld: NEGATIVE

## 2018-10-16 MED ORDER — FUROSEMIDE 10 MG/ML IJ SOLN
80.0000 mg | INTRAMUSCULAR | Status: AC
  Administered 2018-10-16: 80 mg via INTRAVENOUS
  Filled 2018-10-16: qty 8

## 2018-10-16 MED ORDER — INSULIN GLARGINE 100 UNIT/ML ~~LOC~~ SOLN: 16.0000 [IU] | Freq: Every day | SUBCUTANEOUS | 1 refills | Status: DC

## 2018-10-16 MED ORDER — SODIUM CHLORIDE 0.9% FLUSH: 20.0000 mL | Freq: Three times a day (TID) | INTRAVENOUS | 0 refills | Status: DC

## 2018-10-16 MED ORDER — CEFAZOLIN IV (FOR PTA / DISCHARGE USE ONLY): 2.0000 g | Freq: Three times a day (TID) | INTRAVENOUS | 0 refills | Status: AC

## 2018-10-16 MED ORDER — CEFAZOLIN IV (FOR PTA / DISCHARGE USE ONLY): 2.0000 g | Freq: Three times a day (TID) | INTRAVENOUS | 0 refills | Status: DC

## 2018-10-16 MED ORDER — LEVOFLOXACIN 500 MG PO TABS: 500.0000 mg | ORAL_TABLET | Freq: Every day | ORAL | 0 refills | Status: DC

## 2018-10-16 NOTE — Progress Notes (Signed)
New referral for outpatient Palliative to follow at home received from [facility]. Patient to be followed by [facility] home health. Patient information faxed to referral. Flo Shanks RN, BSN, [place] [facility] [place], hospital Liaison [phone]

## 2018-10-16 NOTE — Discharge Instructions (Signed)
Weight yourself daily.  If you gain 3 pounds in a day or five pounds in a week, can take your torsemide twice a day.   Community-Acquired Pneumonia, Adult Pneumonia is an infection of the lungs. It causes swelling in the airways of the lungs. Mucus and fluid may also build up inside the airways. One type of pneumonia can happen while a person is in a hospital. A different type can happen when a person is not in a hospital (community-acquired pneumonia).  What are the causes?  This condition is caused by germs (viruses, bacteria, or fungi). Some types of germs can be passed from one person to another. This can happen when you breathe in droplets from the cough or sneeze of an infected person. What increases the risk? You are more likely to develop this condition if you:  Have a long-term (chronic) disease, such as: ? Chronic obstructive pulmonary disease (COPD). ? Asthma. ? Cystic fibrosis. ? Congestive heart failure. ? Diabetes. ? Kidney disease.  Have HIV.  Have sickle cell disease.  Have had your spleen removed.  Do not take good care of your teeth and mouth (poor dental hygiene).  Have a medical condition that increases the risk of breathing in droplets from your own mouth and nose.  Have a weakened body defense system (immune system).  Are a smoker.  Travel to areas where the germs that cause this illness are common.  Are around certain animals or the places they live. What are the signs or symptoms?  A dry cough.  A wet (productive) cough.  Fever.  Sweating.  Chest pain. This often happens when breathing deeply or coughing.  Fast breathing or trouble breathing.  Shortness of breath.  Shaking chills.  Feeling tired (fatigue).  Muscle aches. How is this treated? Treatment for this condition depends on many things. Most adults can be treated at home. In some cases, treatment must happen in a hospital. Treatment may include:  Medicines given by mouth or  through an IV tube.  Being given extra oxygen.  Respiratory therapy. In rare cases, treatment for very bad pneumonia may include:  Using a machine to help you breathe.  Having a procedure to remove fluid from around your lungs. Follow these instructions at home: Medicines  Take over-the-counter and prescription medicines only as told by your doctor. ? Only take cough medicine if you are losing sleep.  If you were prescribed an antibiotic medicine, take it as told by your doctor. Do not stop taking the antibiotic even if you start to feel better. General instructions   Sleep with your head and neck raised (elevated). You can do this by sleeping in a recliner or by putting a few pillows under your head.  Rest as needed. Get at least 8 hours of sleep each night.  Drink enough water to keep your pee (urine) pale yellow.  Eat a healthy diet that includes plenty of vegetables, fruits, whole grains, low-fat dairy products, and lean protein.  Do not use any products that contain nicotine or tobacco. These include cigarettes, e-cigarettes, and chewing tobacco. If you need help quitting, ask your doctor.  Keep all follow-up visits as told by your doctor. This is important. How is this prevented? A shot (vaccine) can help prevent pneumonia. Shots are often suggested for:  People older than 65 years of age.  People older than 65 years of age who: ? Are having cancer treatment. ? Have long-term (chronic) lung disease. ? Have problems with their  body's defense system. You may also prevent pneumonia if you take these actions:  Get the flu (influenza) shot every year.  Go to the dentist as often as told.  Wash your hands often. If you cannot use soap and water, use hand sanitizer. Contact a doctor if:  You have a fever.  You lose sleep because your cough medicine does not help. Get help right away if:  You are short of breath and it gets worse.  You have more chest  pain.  Your sickness gets worse. This is very serious if: ? You are an older adult. ? Your body's defense system is weak.  You cough up blood. Summary  Pneumonia is an infection of the lungs.  Most adults can be treated at home. Some will need treatment in a hospital.  Drink enough water to keep your pee pale yellow.  Get at least 8 hours of sleep each night. This information is not intended to replace advice given to you by your health care provider. Make sure you discuss any questions you have with your health care provider. Document Released: 03/11/2008 Document Revised: 05/21/2018 Document Reviewed: 05/21/2018 Elsevier Interactive Patient Education  2019 [employer].

## 2018-10-16 NOTE — Care Management (Signed)
HF protocol order received from Physician and placed on chart. Corene Cornea with [facility] notified

## 2018-10-16 NOTE — Care Management (Signed)
Notified Jason with [facility] about nocturnal need for O2, Dr. To write order

## 2018-10-16 NOTE — Discharge Summary (Addendum)
[place] [facility] [place] NAME: [name]    MR#:  [id number]  DATE OF BIRTH:  Apr 09, 1966  DATE OF ADMISSION:  10/12/2018 ADMITTING PHYSICIAN: Gorden Harms, MD  DATE OF DISCHARGE: 10/16/2018  PRIMARY CARE PHYSICIAN: Rutherford Guys, MD    ADMISSION DIAGNOSIS:  Hypoxia [R09.02] Healthcare-associated pneumonia [J18.9]  DISCHARGE DIAGNOSIS:  Active Problems:   HCAP (healthcare-associated pneumonia)   SECONDARY DIAGNOSIS:   Past Medical History:  Diagnosis Date  . Acute myocardial infarction, subendocardial infarction, initial episode of care ([place]) 07/21/2012  . Acute osteomyelitis involving ankle and foot ([place]) 02/06/2015  . Acute systolic heart failure ([place]) 07/21/2012   New onset 07/19/12; admission to [facility] ED. Elevated Troponins.  S/p 2D-echo with EF 20-25%.  S/p cardiac catheterization with stenting LAD.  Repeat 2D-echo 10/2011 with improved EF of 35%.   . Anemia   . Automatic implantable cardioverter-defibrillator in situ    a. MDT CRT-D 06/2014, SN: [id number]    . CAD (coronary artery disease)    a. cardiac cath 101/04/2012: PCI/DES to chronically occluded mLAD, consideration PCI to diag branch in 4 weeks.   . Cataract   . Chicken pox   . Chronic systolic CHF (congestive heart failure) (HCC)    a. mixed ICM & NICM; b. EF 20-25% by echo 07/2012, mid-dist 2/3 of LV sev HK/AK, mild MR. echo 10/2012: EF 30-35%, sev HK ant-septal & inf walls, GR1DD, mild MR, PASP 33. c. echo 02/2013: EF 30%, GR1DD, mild MR. echo 04/2014: EF 30%, Septal-lat dyssynchrony, global HK, inf AK, GR1DD, mild MR. d. echo 10/2014: EF50-55%, WM nl, GR1DD, septal mild paradox. e. echo 02/2015: EF 50-55%, wm   . Depression   . Heart attack ([place])   . Heel ulcer ([place]) 04/27/2015  . History of blood transfusion ~ 2011   "plasma; had neuropathy; couldn't walk"  . Hypertension   . LBBB (left bundle branch block)   . Neuromuscular disorder ([place])   . Neuropathy 2011   . Obesity, unspecified   . OSA on CPAP    Moderate with AHI 23/hr and now on CPAP at 16cm H2O.  Her DME is [facility]  . Pure hypercholesterolemia   . Type II diabetes mellitus ([place])   . Unspecified vitamin D deficiency     HOSPITAL COURSE:   1.  Right upper lobe pneumonia.  Patient on Maxipime while here.  Switch over to Levaquin at night upon going home.  Recommend repeating a chest x-ray in 6 weeks to make sure this clears. 2.  Acute kidney injury on chronic kidney disease stage III.Marland Kitchen  The patient was given IV fluids during the hospital course.  Creatinine improved from 2.52 down to 1.52.  This is at her baseline so we restarted her medications. 3.  Sleep apnea with nocturnal hypoxia.  The patient qualifies for nighttime oxygen.  She states she has a CPAP but does not use it because it caused a nose ulcer.  We will set up with home oxygen at night and while she is sleeping. 4.  Recent MSSA bacteremia on 6 weeks of IV Ancef.  Go back to Ancef at home.  Patient's antibiotics will finish up November 10, 2018.  Follow-up with Dr. Elvina Mattes as outpatient. 5.  Anemia of chronic disease.  Patient had a drop in hemoglobin likely secondary to dilution. 6.  Type 2 diabetes.  Stop Amaryl.  Continue glargine insulin 7.  Chronic systolic congestive heart failure.  I did  give a dose of IV Lasix today.  Can go back on torsemide.  Home health with CHF protocol.  Patient on Coreg, losartan.  I would not give spironolactone secondary to acute kidney injury during the hospital stay. 8.  Hyperlipidemia unspecified on Crestor 9.  Recent and STEMI on aspirin, Plavix beta-blocker and Crestor  DISCHARGE CONDITIONS:   Satisfactory  CONSULTS OBTAINED:  None DRUG ALLERGIES:  No Known Allergies  DISCHARGE MEDICATIONS:   Allergies as of 10/16/2018   No Known Allergies     Medication List    STOP taking these medications   ceFAZolin 2-4 GM/100ML-% IVPB Commonly known as:  ANCEF   glimepiride 4 MG  tablet Commonly known as:  AMARYL     TAKE these medications   AMBULATORY NON FORMULARY MEDICATION 1 Units by Other route once. Medication Name: foot brace   amiodarone 200 MG tablet Commonly known as:  PACERONE Take 1 tablet (200 mg total) by mouth 2 (two) times daily.   aspirin 81 MG EC tablet Take 1 tablet (81 mg total) by mouth daily.   CALCIUM 600 + D PO Take 1 tablet by mouth 2 (two) times daily.   carvedilol 12.5 MG tablet Commonly known as:  COREG Take 1 tablet (12.5 mg total) by mouth 2 (two) times daily with a meal.   ceFAZolin  IVPB Commonly known as:  ANCEF Inject 2 g into the vein every 8 (eight) hours. Indication:  MSSA bacteremia/toe infection Last Day of Therapy:  11/10/2018 Labs - Once weekly:  CBC/D and BMP, Labs - Every other week:  ESR and CRP What changed:  You were already taking a medication with the same name, and this prescription was added. Make sure you understand how and when to take each.   ceFAZolin  IVPB Commonly known as:  ANCEF Inject 2 g into the vein every 8 (eight) hours. Indication: MSSA bacteremia and toe infection Last Day of Therapy:  11/10/2018 Labs - Once weekly:  CBC/D and BMP, Labs - Every other week:  ESR and CRP What changed:  Another medication with the same name was added. Make sure you understand how and when to take each.   citalopram 20 MG tablet Commonly known as:  CELEXA Take 1 tablet (20 mg total) by mouth daily.   clopidogrel 75 MG tablet Commonly known as:  PLAVIX Take 1 tablet (75 mg total) by mouth daily.   ferrous sulfate 325 (65 FE) MG tablet Take 325 mg by mouth daily.   glucose blood test strip Commonly known as:  ONE TOUCH ULTRA TEST Check sugar three times daily dx: DMII uncontrolled insulin dependent with complications   HYDROcodone-acetaminophen 5-325 MG tablet Commonly known as:  NORCO/VICODIN Take 1-2 tablets by mouth every 6 (six) hours as needed for severe pain.   insulin glargine 100 UNIT/ML  injection Commonly known as:  LANTUS Inject 0.16 mLs (16 Units total) into the skin at bedtime. What changed:    how much to take  when to take this   Insulin Pen Needle 31G X 8 MM Misc Commonly known as:  B-D ULTRAFINE III SHORT PEN USE AS DIRECTED WITH LANTUS; dx: DMII uncontrolled, insulin dependent, with complications   levofloxacin 500 MG tablet Commonly known as:  LEVAQUIN Take 1 tablet (500 mg total) by mouth at bedtime.   lidocaine 5 % Commonly known as:  LIDODERM Place 1 patch onto the skin daily. Remove & Discard patch within 12 hours or as directed by MD  losartan 25 MG tablet Commonly known as:  COZAAR Take 1 tablet (25 mg total) by mouth daily.   nitroGLYCERIN 0.4 MG SL tablet Commonly known as:  NITROSTAT Place 1 tablet (0.4 mg total) under the tongue every 5 (five) minutes as needed for chest pain.   omega-3 acid ethyl esters 1 g capsule Commonly known as:  LOVAZA Take 1 g by mouth 2 (two) times daily.   protein supplement shake Liqd Commonly known as:  PREMIER PROTEIN Take 325 mLs (11 oz total) by mouth 2 (two) times daily between meals.   rosuvastatin 40 MG tablet Commonly known as:  CRESTOR TAKE 1 TABLET BY MOUTH ONCE DAILY   sodium chloride flush 0.9 % Soln Commonly known as:  NS 20 mLs by Intracatheter route every 8 (eight) hours.   torsemide 20 MG tablet Commonly known as:  DEMADEX Take 1 tablet (20 mg total) by mouth daily. What changed:  when to take this            Home Infusion Instuctions  (From admission, onward)         Start     Ordered   10/16/18 0000  Home infusion instructions [facility] May follow [place] Dosing Protocol; May administer Cathflo as needed to maintain patency of vascular access device.; Flushing of vascular access device: per [facility] Protocol: 0.9% NaCl pre/post medica...    Question Answer Comment  Instructions May follow [place] Dosing Protocol   Instructions May administer Cathflo as  needed to maintain patency of vascular access device.   Instructions Flushing of vascular access device: per [facility] Protocol: 0.9% NaCl pre/post medication administration and prn patency; Heparin 100 u/ml, 76m for implanted ports and Heparin 10u/ml, 557mfor all other central venous catheters.   Instructions May follow [facility] Anaphylaxis Protocol for First Dose Administration in the home: 0.9% NaCl at 25-50 ml/hr to maintain IV access for protocol meds. Epinephrine 0.3 ml IV/IM PRN and Benadryl 25-50 IV/IM PRN s/s of anaphylaxis.   Instructions [facility] Infusion Coordinator (RN) to assist per patient IV care needs in the home PRN.      10/16/18 1108           Durable Medical Equipment  (From admission, onward)         Start     Ordered   10/16/18 1159  For home use only DME oxygen  Once    Comments:  Nocturnal oxygen only  Question Answer Comment  Mode or (Route) Nasal cannula   Liters per Minute 2   Frequency Only at night (stationary unit needed)   Oxygen conserving device Yes   Oxygen delivery system Gas      10/16/18 1159   10/16/18 1058  For home use only DME oxygen  Once    Question Answer Comment  Mode or (Route) Nasal cannula   Liters per Minute 2   Frequency Only at night (stationary unit needed)   Oxygen conserving device Yes   Oxygen delivery system Gas      10/16/18 1057           DISCHARGE INSTRUCTIONS:   Follow-up PMD 5 days CHF clinic Follow-up podiatry  If you experience worsening of your admission symptoms, develop shortness of breath, life threatening emergency, suicidal or homicidal thoughts you must seek medical attention immediately by calling 911 or calling your MD immediately  if symptoms less severe.  You Must read complete instructions/literature along with all the possible adverse reactions/side effects  for all the Medicines you take and that have been prescribed to you. Take any new Medicines after you have completely understood and  accept all the possible adverse reactions/side effects.   Please note  You were cared for by a hospitalist during your hospital stay. If you have any questions about your discharge medications or the care you received while you were in the hospital after you are discharged, you can call the unit and asked to speak with the hospitalist on call if the hospitalist that took care of you is not available. Once you are discharged, your primary care physician will handle any further medical issues. Please note that NO REFILLS for any discharge medications will be authorized once you are discharged, as it is imperative that you return to your primary care physician (or establish a relationship with a primary care physician if you do not have one) for your aftercare needs so that they can reassess your need for medications and monitor your lab values.    Today   CHIEF COMPLAINT:   Chief Complaint  Patient presents with  . Shortness of Breath    HISTORY OF PRESENT ILLNESS:  Georgine Wiltse  is a 63 y.o. female came in with shortness of breath and found to have pneumonia   VITAL SIGNS:  Blood pressure (!) 158/68, pulse 72, temperature 98 F (36.7 C), resp. rate 15, height 5' 8"  (1.727 m), weight (!) 136.3 kg, SpO2 90 %.   PHYSICAL EXAMINATION:  GENERAL:  63 y.o.-year-old patient lying in the bed with no acute distress.  EYES: Pupils equal, round, reactive to light and accommodation. No scleral icterus. Extraocular muscles intact.  HEENT: Head atraumatic, normocephalic. Oropharynx and nasopharynx clear.  NECK:  Supple, no jugular venous distention. No thyroid enlargement, no tenderness.  LUNGS: Decreased breath sounds bilateral bases, no wheezing, rales,rhonchi or crepitation. No use of accessory muscles of respiration.  CARDIOVASCULAR: S1, S2 normal. No murmurs, rubs, or gallops.  ABDOMEN: Soft, non-tender, non-distended. Bowel sounds present. No organomegaly or mass.  EXTREMITIES: No pedal  edema, cyanosis, or clubbing.  NEUROLOGIC: Cranial nerves II through XII are intact. Muscle strength 5/5 in all extremities. Sensation intact. Gait not checked.  PSYCHIATRIC: The patient is alert and oriented x 3.  SKIN: No obvious rash, lesion, or ulcer.   DATA REVIEW:   CBC Recent Labs  Lab 10/15/18 0457 10/16/18 0314  WBC 7.3  --   HGB 7.8* 8.0*  HCT 24.4*  --   PLT 198  --     Chemistries  Recent Labs  Lab 10/16/18 0314  NA 136  K 4.7  CL 107  CO2 23  GLUCOSE 154*  BUN 38*  CREATININE 1.52*  CALCIUM 8.1*    Cardiac Enzymes Recent Labs  Lab 10/12/18 2037  TROPONINI 0.03*    Microbiology Results  Results for orders placed or performed during the hospital encounter of 10/12/18  Culture, blood (routine x 2) Call MD if unable to obtain prior to antibiotics being given     Status: None (Preliminary result)   Collection Time: 10/13/18  1:41 AM  Result Value Ref Range Status   Specimen Description BLOOD LEFT HAND  Final   Special Requests   Final    BOTTLES DRAWN AEROBIC AND ANAEROBIC Blood Culture results may not be optimal due to an excessive volume of blood received in culture bottles   Culture   Final    NO GROWTH 3 DAYS Performed at [facility], [address] [place], [place] [address]    Report Status PENDING  Incomplete  Culture, blood (routine x 2) Call MD if unable to obtain prior to antibiotics being given     Status: None (Preliminary result)   Collection Time: 10/13/18  1:50 AM  Result Value Ref Range Status   Specimen Description BLOOD RIGHT HAND  Final   Special Requests   Final    BOTTLES DRAWN AEROBIC AND ANAEROBIC Blood Culture adequate volume   Culture   Final    NO GROWTH 3 DAYS Performed at [facility], [address] [place], [place] [address]    Report Status PENDING  Incomplete      Management plans discussed with the patient, family and they are in agreement.  CODE STATUS:     Code Status Orders   (From admission, onward)         Start     Ordered   10/13/18 0048  Do not attempt resuscitation (DNR)  Continuous    Question Answer Comment  In the event of cardiac or respiratory ARREST Do not call a "code blue"   In the event of cardiac or respiratory ARREST Do not perform Intubation, CPR, defibrillation or ACLS   In the event of cardiac or respiratory ARREST Use medication by any route, position, wound care, and other measures to relive pain and suffering. May use oxygen, suction and manual treatment of airway obstruction as needed for comfort.   Comments Nurse may pronounce      10/13/18 0047        Code Status History    Date Active Date Inactive Code Status Order ID Comments User Context   09/28/2018 1456 10/09/2018 1726 Full Code [id number]  Nicholes Mango, MD ED   11/11/2015 1411 11/12/2015 1635 Full Code [id number]  Vaughan Basta, MD Inpatient   06/17/2015 0148 06/19/2015 1659 Full Code [id number]  Harrie Foreman, MD Inpatient   02/17/2015 1330 02/24/2015 1925 DNR [id number]  Bettey Costa, MD ED   07/06/2014 1100 07/07/2014 1524 Full Code [id number]  Deboraha Sprang, MD Inpatient   07/22/2012 1427 07/26/2012 1507 DNR [id number]  Nolon Rod, DO Inpatient      TOTAL TIME TAKING CARE OF THIS PATIENT: 35 minutes.    Loletha Grayer M.D on 10/16/2018 at 2:46 PM  Between 7am to 6pm - Pager - [phone]  After 6pm go to [email or web address] - password [email or web address]  [facility] [employer] Office  [phone]  CC: Primary care physician; Rutherford Guys, MD

## 2018-10-16 NOTE — Care Management (Signed)
Santiago Glad with Palliative team notified that patient will need home palliative at discharge; MD please include this in your note/order along with Home health services. [facility].

## 2018-10-17 DIAGNOSIS — E559 Vitamin D deficiency, unspecified: Secondary | ICD-10-CM | POA: Diagnosis not present

## 2018-10-17 DIAGNOSIS — M86172 Other acute osteomyelitis, left ankle and foot: Secondary | ICD-10-CM | POA: Diagnosis not present

## 2018-10-17 DIAGNOSIS — I130 Hypertensive heart and chronic kidney disease with heart failure and stage 1 through stage 4 chronic kidney disease, or unspecified chronic kidney disease: Secondary | ICD-10-CM | POA: Diagnosis not present

## 2018-10-17 DIAGNOSIS — Z452 Encounter for adjustment and management of vascular access device: Secondary | ICD-10-CM | POA: Diagnosis not present

## 2018-10-17 DIAGNOSIS — I5022 Chronic systolic (congestive) heart failure: Secondary | ICD-10-CM | POA: Diagnosis not present

## 2018-10-17 DIAGNOSIS — I2510 Atherosclerotic heart disease of native coronary artery without angina pectoris: Secondary | ICD-10-CM | POA: Diagnosis not present

## 2018-10-17 DIAGNOSIS — E1122 Type 2 diabetes mellitus with diabetic chronic kidney disease: Secondary | ICD-10-CM | POA: Diagnosis not present

## 2018-10-17 DIAGNOSIS — E1140 Type 2 diabetes mellitus with diabetic neuropathy, unspecified: Secondary | ICD-10-CM | POA: Diagnosis not present

## 2018-10-17 DIAGNOSIS — G4733 Obstructive sleep apnea (adult) (pediatric): Secondary | ICD-10-CM | POA: Diagnosis not present

## 2018-10-17 DIAGNOSIS — I252 Old myocardial infarction: Secondary | ICD-10-CM | POA: Diagnosis not present

## 2018-10-17 DIAGNOSIS — Z8631 Personal history of diabetic foot ulcer: Secondary | ICD-10-CM | POA: Diagnosis not present

## 2018-10-17 DIAGNOSIS — E1169 Type 2 diabetes mellitus with other specified complication: Secondary | ICD-10-CM | POA: Diagnosis not present

## 2018-10-17 DIAGNOSIS — I447 Left bundle-branch block, unspecified: Secondary | ICD-10-CM | POA: Diagnosis not present

## 2018-10-17 DIAGNOSIS — Z95820 Peripheral vascular angioplasty status with implants and grafts: Secondary | ICD-10-CM | POA: Diagnosis not present

## 2018-10-17 DIAGNOSIS — B9561 Methicillin susceptible Staphylococcus aureus infection as the cause of diseases classified elsewhere: Secondary | ICD-10-CM | POA: Diagnosis not present

## 2018-10-17 DIAGNOSIS — Z4781 Encounter for orthopedic aftercare following surgical amputation: Secondary | ICD-10-CM | POA: Diagnosis not present

## 2018-10-17 DIAGNOSIS — Z89421 Acquired absence of other right toe(s): Secondary | ICD-10-CM | POA: Diagnosis not present

## 2018-10-17 DIAGNOSIS — Z955 Presence of coronary angioplasty implant and graft: Secondary | ICD-10-CM | POA: Diagnosis not present

## 2018-10-17 DIAGNOSIS — F329 Major depressive disorder, single episode, unspecified: Secondary | ICD-10-CM | POA: Diagnosis not present

## 2018-10-17 DIAGNOSIS — A4101 Sepsis due to Methicillin susceptible Staphylococcus aureus: Secondary | ICD-10-CM | POA: Diagnosis not present

## 2018-10-17 DIAGNOSIS — N189 Chronic kidney disease, unspecified: Secondary | ICD-10-CM | POA: Diagnosis not present

## 2018-10-17 DIAGNOSIS — I255 Ischemic cardiomyopathy: Secondary | ICD-10-CM | POA: Diagnosis not present

## 2018-10-17 DIAGNOSIS — Z89422 Acquired absence of other left toe(s): Secondary | ICD-10-CM | POA: Diagnosis not present

## 2018-10-17 DIAGNOSIS — Z95810 Presence of automatic (implantable) cardiac defibrillator: Secondary | ICD-10-CM | POA: Diagnosis not present

## 2018-10-18 LAB — CULTURE, BLOOD (ROUTINE X 2)
Culture: NO GROWTH
Culture: NO GROWTH
Special Requests: ADEQUATE

## 2018-10-19 ENCOUNTER — Telehealth: Payer: Self-pay

## 2018-10-19 NOTE — Telephone Encounter (Signed)
Gave verbal orders for pt to receive Skilled Nursing Care. Pt will receive this care for 6 weeks starting today. Pt will receive care 2 times a week x 1 week, and then 1 x week for 5 weeks.

## 2018-10-20 ENCOUNTER — Encounter: Payer: Self-pay | Admitting: Infectious Diseases

## 2018-10-20 ENCOUNTER — Other Ambulatory Visit: Payer: Self-pay

## 2018-10-20 DIAGNOSIS — A4101 Sepsis due to Methicillin susceptible Staphylococcus aureus: Secondary | ICD-10-CM | POA: Diagnosis not present

## 2018-10-20 DIAGNOSIS — M86172 Other acute osteomyelitis, left ankle and foot: Secondary | ICD-10-CM | POA: Diagnosis not present

## 2018-10-20 NOTE — Patient Outreach (Signed)
[facility]) Care Management  [place]  10/20/2018  [name] 11/03/1955 [id number]   Reason for call: post discharge medication review  Unsuccessful telephone call attempt # 2  to patient.   HIPAA compliant voicemail left requesting a return call.  Plan:  I will make another outreach attempt to patient within 3-4 business days.  Joetta Manners, PharmD Clinical Pharmacist [place] [phone]

## 2018-10-21 ENCOUNTER — Telehealth: Payer: Self-pay | Admitting: Family Medicine

## 2018-10-21 NOTE — Telephone Encounter (Signed)
Copied from [place] [id number]. Topic: Quick Communication - Home Health Verbal Orders >> Oct 21, 2018  3:46 PM Rutherford Nail, [place] wrote: Caller/Agency: [place] Number: [phone] (can leave a voicemail) Requesting OT/PT/Skilled Nursing/Social Work: PT Frequency:  2x a week for 3 weeks

## 2018-10-22 ENCOUNTER — Encounter: Payer: Self-pay | Admitting: Family Medicine

## 2018-10-22 ENCOUNTER — Ambulatory Visit: Payer: PPO | Admitting: Family Medicine

## 2018-10-22 ENCOUNTER — Ambulatory Visit (INDEPENDENT_AMBULATORY_CARE_PROVIDER_SITE_OTHER): Payer: PPO | Admitting: Family Medicine

## 2018-10-22 ENCOUNTER — Other Ambulatory Visit: Payer: Self-pay

## 2018-10-22 VITALS — BP 123/68 | HR 60 | Temp 98.3°F

## 2018-10-22 DIAGNOSIS — IMO0001 Reserved for inherently not codable concepts without codable children: Secondary | ICD-10-CM

## 2018-10-22 DIAGNOSIS — R0902 Hypoxemia: Secondary | ICD-10-CM | POA: Diagnosis not present

## 2018-10-22 DIAGNOSIS — Z9189 Other specified personal risk factors, not elsewhere classified: Secondary | ICD-10-CM

## 2018-10-22 DIAGNOSIS — J189 Pneumonia, unspecified organism: Secondary | ICD-10-CM | POA: Diagnosis not present

## 2018-10-22 DIAGNOSIS — N183 Chronic kidney disease, stage 3 unspecified: Secondary | ICD-10-CM

## 2018-10-22 DIAGNOSIS — E1142 Type 2 diabetes mellitus with diabetic polyneuropathy: Secondary | ICD-10-CM | POA: Diagnosis not present

## 2018-10-22 DIAGNOSIS — Z89422 Acquired absence of other left toe(s): Secondary | ICD-10-CM

## 2018-10-22 MED ORDER — GLIMEPIRIDE 4 MG PO TABS: 4.0000 mg | ORAL_TABLET | Freq: Two times a day (BID) | ORAL | 3 refills | Status: DC

## 2018-10-22 NOTE — Telephone Encounter (Signed)
Called and gave verbal orders for PT 

## 2018-10-22 NOTE — Progress Notes (Addendum)
TRANSITION OF CARE VISIT   Primary Care Physician: Rutherford Guys, MD   Date of Admission: 10/13/2018 Date of Discharge: 10/16/2018  Discharged from: [place] [facility]  Discharge Diagnosis:  Health-care associated PNA   Summary of Admission:  RUL PNA - treated with maxipime, dc on levaquin AKI on CKD 3 - dc crt 1.52, treated with IVF CHF, needed mild diuresis with IV lasix Dc with home health  Of note patient had recently hosp in late dec for MSSA bactermia secondary to osteomyletis, s/p Left second MTP and stent placement/angioplasty, on 6 weeks of ancef, normal TEE Will be on ancef until Feb 4th Sees podiatry next week  Patient Care Team: Rutherford Guys, MD as PCP - General (Family Medicine) Calvert Cantor, MD as Consulting Physician (Ophthalmology) Lavonia Dana, MD as Consulting Physician (Internal Medicine) Barbaraann Faster, RN as [place] [facility] [place], Donalynn Furlong, [facility] as Pharmacist (Pharmacist) Albertine Patricia, DPM as Attending Physician (Podiatry) Deboraha Sprang, MD as Consulting Physician (Cardiology)  TODAY's VISIT  Patient/Caregiver self-reported problems/concerns:  Completed levaquin Yesterday started coughing with mild SOB Non productive  All her grandkids are sick with URIs, has one that since then has been hospitalized with RSV No fever, chills, chest pain, palpitations Home health care present, managing PICC line, PT Using a walker, on oxygen 2L at bedtime for hypoxia, has known OSA but not using cpap has had multiple issues with masks No falls Back pain chronic, on tramadol, pmp reviewed Currently taking vicodin when needed for foot pain  Checking cbgs TID This morning 101 Has been adjusting lantus due to hypoglycemia at night Currently doing 15 units at bedtime Resumed glimperide 59m BID Bedtime cbgs 183, 140, 220  Lab Results  Component  Value Date   HGBA1C 7.3 (A) 07/20/2018   HGBA1C 7.4 (H) 04/15/2018   HGBA1C 7.7 09/27/2017   Lab Results  Component Value Date   MICROALBUR 6.9 08/06/2016   LEllenton32 04/15/2018   CREATININE 1.52 (H) 10/16/2018    Patient Active Problem List   Diagnosis Date Noted  . HCAP (healthcare-associated pneumonia) 10/12/2018  . Sepsis ([place] 09/28/2018  . History of amputation of lesser toe of right foot ([place] 04/19/2018  . 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 Paraparesis of both lower limbs (H[place] 03/29/2018  . Polyradiculoneuropathy (H[place] 03/29/2018  . Paroxysmal atrial fibrillation (HE[place] 03/29/2018  . Class 3 obesity due to excess calories with serious comorbidity and body mass index (BMI) of 40.0 to 44.9 in adult 09/02/2016  . History of CVA (cerebrovascular accident) 11/11/2015  . Chronic renal insufficiency 08/04/2015  . OSA (obstructive sleep apnea) 08/15/2014  . Morbid obesity ([place]Pa[place] 10/26/2013  . Diarrhea, unspecified 04/29/2013  . Essential hypertension, benign 04/29/2013  . Pure hypercholesterolemia 12/07/2012  . Iron deficiency anemia 12/07/2012  . Diabetic peripheral neuropathy associated with type 2 diabetes mellitus (HGraham 12/07/2012  . Chronic systolic congestive heart failure (HWest [place] 07/29/2012  . Left bundle-branch block 07/25/2012  . Atherosclerotic heart disease of native coronary artery without angina pectoris 07/25/2012   No Known Allergies   Past Medical History:  Diagnosis Date  . Acute myocardial infarction, subendocardial infarction, initial  episode of care ([place]) 07/21/2012  . Acute osteomyelitis involving ankle and foot ([place]) 02/06/2015  . Acute systolic heart failure ([place]) 07/21/2012   New onset 07/19/12; admission to [facility] ED. Elevated Troponins.  S/p 2D-echo with EF 20-25%.  S/p cardiac catheterization with stenting LAD.  Repeat 2D-echo 10/2011 with improved EF of 35%.   . Anemia   . Automatic implantable  cardioverter-defibrillator in situ    a. MDT CRT-D 06/2014, SN: [id number]    . CAD (coronary artery disease)    a. cardiac cath 101/04/2012: PCI/DES to chronically occluded mLAD, consideration PCI to diag branch in 4 weeks.   . Cataract   . Chicken pox   . Chronic systolic CHF (congestive heart failure) (HCC)    a. mixed ICM & NICM; b. EF 20-25% by echo 07/2012, mid-dist 2/3 of LV sev HK/AK, mild MR. echo 10/2012: EF 30-35%, sev HK ant-septal & inf walls, GR1DD, mild MR, PASP 33. c. echo 02/2013: EF 30%, GR1DD, mild MR. echo 04/2014: EF 30%, Septal-lat dyssynchrony, global HK, inf AK, GR1DD, mild MR. d. echo 10/2014: EF50-55%, WM nl, GR1DD, septal mild paradox. e. echo 02/2015: EF 50-55%, wm   . Depression   . Heart attack ([place])   . Heel ulcer ([place]) 04/27/2015  . History of blood transfusion ~ 2011   "plasma; had neuropathy; couldn't walk"  . Hypertension   . LBBB (left bundle branch block)   . Neuromuscular disorder ([place])   . Neuropathy 2011  . Obesity, unspecified   . OSA on CPAP    Moderate with AHI 23/hr and now on CPAP at 16cm H2O.  Her DME is [facility]  . Pure hypercholesterolemia   . Type II diabetes mellitus ([place])   . Unspecified vitamin D deficiency    Past Surgical History:  Procedure Laterality Date  . AMPUTATION TOE Right 06/18/2015   Procedure: AMPUTATION TOE;  Surgeon: Samara Deist, DPM;  Location: [facility] ORS;  Service: Podiatry;  Laterality: Right;  . AMPUTATION TOE Left 10/08/2018   Procedure: AMPUTATION TOE LEFT 2ND;  Surgeon: Albertine Patricia, DPM;  Location: [facility] ORS;  Service: Podiatry;  Laterality: Left;  . BACK SURGERY    . BI-VENTRICULAR IMPLANTABLE CARDIOVERTER DEFIBRILLATOR N/A 07/06/2014   Procedure: BI-VENTRICULAR IMPLANTABLE CARDIOVERTER DEFIBRILLATOR  (CRT-D);  Surgeon: Deboraha Sprang, MD;  Location: [facility] CATH LAB;  Service: Cardiovascular;  Laterality: N/A;  . BI-VENTRICULAR IMPLANTABLE CARDIOVERTER DEFIBRILLATOR  (CRT-D)  07/06/2014  . BILATERAL OOPHORECTOMY  01/2011    ovarian cyst benign  . COLONOSCOPY WITH PROPOFOL Left 02/22/2015   Procedure: COLONOSCOPY WITH PROPOFOL;  Surgeon: Hulen Luster, MD;  Location: [facility] ENDOSCOPY;  Service: Endoscopy;  Laterality: Left;  . CORONARY ANGIOPLASTY WITH STENT PLACEMENT Left 07/2012   new onset systolic CHF; elevated troponins.  Cardiac catheterization with stenting to LAD; EF 15%.  2D-echo: EF 20-25%.  . ESOPHAGOGASTRODUODENOSCOPY N/A 02/22/2015   Procedure: ESOPHAGOGASTRODUODENOSCOPY (EGD);  Surgeon: Hulen Luster, MD;  Location: [facility] ENDOSCOPY;  Service: Endoscopy;  Laterality: N/A;  . INCISION AND DRAINAGE ABSCESS Right 2007   groin; with ICU stay due to sepsis.  Mar[place] Kitchen LAPAROSCOPIC CHOLECYSTECTOMY  2011  . LEFT HEART CATH AND CORONARY ANGIOGRAPHY N/A 10/05/2018   Procedure: LEFT HEART CATH AND CORONARY ANGIOGRAPHY;  Surgeon: Minna Merritts, MD;  Location: [place] CV LAB;  Service: Cardiovascular;  Laterality: N/A;  . LEFT HEART CATHETERIZATION WITH CORONARY ANGIOGRAM N/A 07/21/2012   Procedure: LEFT HEART CATHETERIZATION WITH CORONARY ANGIOGRAM;  Surgeon: Jolaine Artist, MD;  Location:  [place] CATH LAB;  Service: Cardiovascular;  Laterality: N/A;  . [place]   L4-5  . PERCUTANEOUS CORONARY STENT INTERVENTION (PCI-S) N/A 07/23/2012   Procedure: PERCUTANEOUS CORONARY STENT INTERVENTION (PCI-S);  Surgeon: Sherren Mocha, MD;  Location: [facility] CATH LAB;  Service: Cardiovascular;  Laterality: N/A;  . PERIPHERAL VASCULAR BALLOON ANGIOPLASTY Left 10/06/2018   Procedure: PERIPHERAL VASCULAR BALLOON ANGIOPLASTY;  Surgeon: Katha Cabal, MD;  Location: [place] CV LAB;  Service: Cardiovascular;  Laterality: Left;  . PERIPHERAL VASCULAR CATHETERIZATION N/A 02/10/2015   Procedure: Picc Line Insertion;  Surgeon: Katha Cabal, MD;  Location: [place] CV LAB;  Service: Cardiovascular;  Laterality: N/A;  . TEE WITHOUT CARDIOVERSION N/A 10/02/2018   Procedure: TRANSESOPHAGEAL ECHOCARDIOGRAM (TEE);   Surgeon: Wellington Hampshire, MD;  Location: [facility] ORS;  Service: Cardiovascular;  Laterality: N/A;  . [place]  . VAGINAL HYSTERECTOMY  01/2011   Fibroids/DUB.  Ovaries removed. [place].    MEDICATIONS Prior to Admission medications   Medication Sig Start Date End Date Taking? Authorizing Provider  AMBULATORY NON FORMULARY MEDICATION 1 Units by Other route once. Medication Name: foot brace 11/15/15  Yes Patel, Donika K, DO  amiodarone (PACERONE) 200 MG tablet Take 1 tablet (200 mg total) by mouth 2 (two) times daily. 10/09/18  Yes Salary, Avel Peace, MD  aspirin EC 81 MG EC tablet Take 1 tablet (81 mg total) by mouth daily. 10/10/18  Yes Salary, Avel Peace, MD  Calcium Carb-Cholecalciferol (CALCIUM 600 + D PO) Take 1 tablet by mouth 2 (two) times daily.   Yes [provider]  carvedilol (COREG) 12.5 MG tablet Take 1 tablet (12.5 mg total) by mouth 2 (two) times daily with a meal. 10/09/18  Yes Salary, Montell D, MD  ceFAZolin (ANCEF) IVPB Inject 2 g into the vein every 8 (eight) hours. Indication: MSSA bacteremia and toe infection Last Day of Therapy:  11/10/2018 Labs - Once weekly:  CBC/D and BMP, Labs - Every other week:  ESR and CRP 10/16/18 11/16/18 Yes Wieting, Richard, MD  citalopram (CELEXA) 20 MG tablet Take 1 tablet (20 mg total) by mouth daily. 04/15/18  Yes Wardell Honour, MD  clopidogrel (PLAVIX) 75 MG tablet Take 1 tablet (75 mg total) by mouth daily. 10/10/18  Yes Salary, Montell D, MD  ferrous sulfate 325 (65 FE) MG tablet Take 325 mg by mouth daily.   Yes [provider]  glucose blood (ONE TOUCH ULTRA TEST) test strip Check sugar three times daily dx: DMII uncontrolled insulin dependent with complications 2/67/12  Yes Wardell Honour, MD  HYDROcodone-acetaminophen (NORCO/VICODIN) 5-325 MG tablet Take 1-2 tablets by mouth every 6 (six) hours as needed for severe pain. 10/09/18  Yes Salary, Holly Bodily D, MD  insulin glargine (LANTUS) 100 UNIT/ML injection Inject 0.16 mLs  (16 Units total) into the skin at bedtime. 10/16/18  Yes Wieting, Richard, MD  Insulin Pen Needle (B-D ULTRAFINE III SHORT PEN) 31G X 8 MM MISC USE AS DIRECTED WITH LANTUS; dx: DMII uncontrolled, insulin dependent, with complications 4/58/09  Yes Wardell Honour, MD  levofloxacin (LEVAQUIN) 500 MG tablet Take 1 tablet (500 mg total) by mouth at bedtime. 10/16/18  Yes Wieting, Richard, MD  lidocaine (LIDODERM) 5 % Place 1 patch onto the skin daily. Remove & Discard patch within 12 hours or as directed by MD 10/09/18  Yes Salary, Avel Peace, MD  losartan (COZAAR) 25 MG tablet Take 1 tablet (25 mg total) by mouth daily. 10/10/18  Yes Salary, Avel Peace, MD  nitroGLYCERIN (NITROSTAT) 0.4 MG SL tablet Place 1 tablet (0.4 mg total) under the tongue every 5 (five) minutes as needed for chest pain. 10/09/18  Yes Gollan, Kathlene November, MD  omega-3 acid ethyl esters (LOVAZA) 1 g capsule Take 1 g by mouth 2 (two) times daily.   Yes [provider]  protein supplement shake (PREMIER PROTEIN) LIQD Take 325 mLs (11 oz total) by mouth 2 (two) times daily between meals. 10/09/18  Yes Salary, Montell D, MD  rosuvastatin (CRESTOR) 40 MG tablet TAKE 1 TABLET BY MOUTH ONCE DAILY 12/16/17  Yes Gollan, Kathlene November, MD  sodium chloride flush (NS) 0.9 % SOLN 20 mLs by Intracatheter route every 8 (eight) hours. 10/16/18  Yes Wieting, Richard, MD  torsemide (DEMADEX) 20 MG tablet Take 1 tablet (20 mg total) by mouth daily. Patient taking differently: Take 20 mg by mouth at bedtime.  04/15/18  Yes Wardell Honour, MD     Medication Reconciliation conducted with patient/caregiver?  Yes  New medications prescribed/discontinued upon discharge?  Yes  Barriers identified related to medications:  no  LABS  Lab Reviewed: Yes  PHYSICAL EXAM:  Blood pressure 123/68, pulse 60, temperature 98.3 F (36.8 C), temperature source Oral, SpO2 93 %.  Physical Exam  Constitutional: She is oriented to person, place, and time and  well-developed, well-nourished, and in no distress.  HENT:  Head: Normocephalic and atraumatic.  Mouth/Throat: Oropharynx is clear and moist. No oropharyngeal exudate.  Eyes: Pupils are equal, round, and reactive to light. EOM are normal. No scleral icterus.  Neck: Neck supple.  Cardiovascular: Normal rate, regular rhythm and normal heart sounds. Exam reveals no gallop and no friction rub.  No murmur heard. Pulmonary/Chest: Effort normal and breath sounds normal. She has no wheezes. She has no rales.  Musculoskeletal:        General: Edema (LLE, in hard shoe, dressing/wrapping clean/dry/intact) present.  Neurological: She is alert and oriented to person, place, and time.  Skin: Skin is warm and dry.  Psychiatric: Mood and affect normal.    ASSESSMENT: 1. Transition of care performed with sharing of clinical summary 2. HCAP (healthcare-associated pneumonia) Has completed abx, recovering. Repeat CXR at next visit  3. Hypoxia Stable on 2L oxygen at bedtime  4. Chronic kidney disease, stage III (moderate) (HCC) Rechecking labs today given ARF.  - Comprehensive metabolic panel - CBC  5. History of amputation of lesser toe of left foot ([place]) Doing well. Under podiatry care. Complete 6 weeks fo abx for bacteremia.   6. Diabetic peripheral neuropathy associated with type 2 diabetes mellitus ([place]) Checking labs today, medications will be adjusted as needed.  - Hemoglobin A1c   PATIENT EDUCATION PROVIDED: See AVS   FOLLOW-UP:  Return in about 4 weeks (around 11/19/2018) for routine followup.

## 2018-10-22 NOTE — Patient Instructions (Addendum)
If you have lab work done today you will be contacted with your lab results within the next 2 weeks.  If you have not heard from [place] then please contact us. The fastest way to get your results is to register for My Chart.   IF you received an x-ray today, you will receive an invoice from [facility]. Please contact [facility] at [phone] with questions or concerns regarding your invoice.   IF you received labwork today, you will receive an invoice from [facility]. Please contact [facility] at [phone] with questions or concerns regarding your invoice.   Our billing staff will not be able to assist you with questions regarding bills from these companies.  You will be contacted with the lab results as soon as they are available. The fastest way to get your results is to activate your My Chart account. Instructions are located on the last page of this paperwork. If you have not heard from [place] regarding the results in 2 weeks, please contact this office.      Healthcare-Associated Pneumonia  Healthcare-associated pneumonia is a lung infection that a person can get when in a health care setting or during certain procedures. The infection causes air sacs inside the lungs to fill with pus or fluid. Healthcare-associated pneumonia is usually caused by bacteria that are common in health care settings. These bacteria may be resistant to some antibiotic medicines. What are the causes? This condition is caused by bacteria that get into your lungs. You can get this condition if you:  Breathe in droplets from an infected person's cough or sneeze.  Touch something that an infected person coughed or sneezed on and then touch your mouth, nose, or eyes.  Have a bacterial infection somewhere else in your body, if the bacteria spread to your lungs through your blood. What increases the risk? This condition is more likely to develop in people who:  Have a disease that weakens  their body's defense system (immune system) or their ability to cough out germs.  Are older than age 65.  Having trouble swallowing.  Use a feeding or breathing tube.  Have a cold or the flu.  Have an IV tube inserted in a vein.  Have surgery.  Have a bed sore.  Live in a long-term care facility, such as a nursing home.  Were in the hospital for two or more days in the past 3 months.  Received hemodialysis in the past 30 days. What are the signs or symptoms? Symptoms of this condition include:  Fever.  Chills.  Cough.  Shortness of breath.  Wheezing or crackling sounds when breathing. How is this diagnosed? This condition may be diagnosed based on:  Your symptoms.  A chest X-ray.  A measurement of the amount of oxygen in your blood. How is this treated? This condition is treated with antibiotics. Your health care provider may take a sample of cells (culture) from your throat to determine what type of bacteria is in your lungs and change your antibiotic based on the results. If you have bacteria in your blood, trouble breathing, or a low oxygen level, you may need to be treated at the hospital. At the hospital, you will be given antibiotics through an IV tube. You may also be given oxygen or breathing treatments. Follow these instructions at home: Medicine  Take your antibiotic medicine as told by your health care provider. Do not stop taking the antibiotic even if you start to feel better.  Take over-the-counter and other prescription medicines only as told by your health care provider. Activity  Rest at home until you feel better.  Return to your normal activities as told by your health care provider. Ask your health care provider what activities are safe for you. General instructions   Drink enough fluid to keep your urine clear or pale yellow.  Do not use any products that contain nicotine or tobacco, such as cigarettes and e-cigarettes. If you need help  quitting, ask your health care provider.  Limit alcohol intake to no more than 1 drink per day for nonpregnant women and 2 drinks per day for men. One drink equals 12 oz of beer, 5 oz of wine, or 1 oz of hard liquor.  Keep all follow-up visits as told by your health care provider. This is important. How is this prevented? Actions that I can take To lower your risk of getting this condition again:  Do not smoke. This includes e-cigarettes.  Do not drink too much alcohol.  Keep your immune system healthy by eating well and getting enough sleep.  Get a flu shot every year (annually).  Get a pneumonia vaccination if: ? You are older than age 65. ? You smoke. ? You have a long-lasting condition like lung disease.  Exercise your lungs by taking deep breaths, walking, and using an incentive spirometer as directed.  Wash your hands often with soap and water. If you cannot get to a sink to wash your hands, use an alcohol-based hand cleaner.  Make sure your health care providers are washing their hands. If you do not see them wash their hands, ask them to do so.  When you are in a health care facility, avoid touching your eyes, nose, and mouth.  Avoid touching any surface near where people have coughed or sneezed.  Stand away from sick people when they are coughing or sneezing.  Wear a mask if you cannot avoid exposure to people who are sick.  Clean all surfaces often with a disinfectant cleaner, especially if someone is sick at home or work.  Precautions of my health care team Hospitals, nursing homes, and other health care facilities take special care to try to prevent healthcare-associated pneumonia. To do this, your health care team may:  Clean their hands with soap and water or with alcohol-based hand sanitizer before and after seeing patients.  Wear gloves or masks during treatment.  Sanitize medical instruments, tubes, other equipment, and surfaces in patient  rooms.  Raise (elevate) the head of your hospital bed so you are not lying flat. The head of the bed may be elevated 30 degrees or more.  Have you sit up and move around as soon as possible after surgery.  Only insert a breathing tube if needed.  Do these things for you if you have a breathing tube: ? Clean the inside of your mouth regularly. ? Remove the breathing tube as soon as it is no longer needed. Contact a health care provider if:  Your symptoms do not get better or they get worse.  Your symptoms come back after you have finished taking your antibiotics. Get help right away if:  You have trouble breathing.  You have confusion or difficulty thinking. This information is not intended to replace advice given to you by your health care provider. Make sure you discuss any questions you have with your health care provider. Document Released: 02/13/2016 Document Revised: 07/09/2016 Document Reviewed: 06/21/2016 Elsevier Interactive Patient Education  2019 [place].

## 2018-10-23 ENCOUNTER — Telehealth: Payer: Self-pay | Admitting: Family Medicine

## 2018-10-23 NOTE — Telephone Encounter (Signed)
Copied from [place] [id number]. Topic: Quick Communication - Home Health Verbal Orders >> Oct 23, 2018 12:58 PM Rayann Heman wrote: Caller/Agency: Margret Chance Number: [phone]  Eustace Pen calling with [facility] called to report on pt Pt is having bilateral wheezing  Productive cough with white mucus They reviewed incentive spirometer and told pt she soul be doing this 4x an hr  Oxygen is reading 88% with room air  Temp 99.1 B/P 100/58 Pulse 87  FYI

## 2018-10-23 NOTE — Telephone Encounter (Signed)
Patient scheduled 12/1

## 2018-10-26 ENCOUNTER — Ambulatory Visit: Payer: Self-pay

## 2018-10-26 ENCOUNTER — Ambulatory Visit (INDEPENDENT_AMBULATORY_CARE_PROVIDER_SITE_OTHER): Payer: PPO

## 2018-10-26 ENCOUNTER — Telehealth: Payer: Self-pay | Admitting: Licensed Clinical Social Worker

## 2018-10-26 ENCOUNTER — Encounter: Payer: Self-pay | Admitting: Infectious Diseases

## 2018-10-26 ENCOUNTER — Other Ambulatory Visit: Payer: Self-pay | Admitting: Pharmacist

## 2018-10-26 DIAGNOSIS — I5022 Chronic systolic (congestive) heart failure: Secondary | ICD-10-CM | POA: Diagnosis not present

## 2018-10-26 DIAGNOSIS — I255 Ischemic cardiomyopathy: Secondary | ICD-10-CM | POA: Diagnosis not present

## 2018-10-26 DIAGNOSIS — N189 Chronic kidney disease, unspecified: Secondary | ICD-10-CM | POA: Diagnosis not present

## 2018-10-26 DIAGNOSIS — I447 Left bundle-branch block, unspecified: Secondary | ICD-10-CM | POA: Diagnosis not present

## 2018-10-26 DIAGNOSIS — E1122 Type 2 diabetes mellitus with diabetic chronic kidney disease: Secondary | ICD-10-CM | POA: Diagnosis not present

## 2018-10-26 DIAGNOSIS — E1169 Type 2 diabetes mellitus with other specified complication: Secondary | ICD-10-CM | POA: Diagnosis not present

## 2018-10-26 DIAGNOSIS — A0472 Enterocolitis due to Clostridium difficile, not specified as recurrent: Secondary | ICD-10-CM | POA: Diagnosis not present

## 2018-10-26 DIAGNOSIS — M86172 Other acute osteomyelitis, left ankle and foot: Secondary | ICD-10-CM | POA: Diagnosis not present

## 2018-10-26 DIAGNOSIS — J181 Lobar pneumonia, unspecified organism: Secondary | ICD-10-CM | POA: Diagnosis not present

## 2018-10-26 LAB — COMPREHENSIVE METABOLIC PANEL
ALT: <5 IU/L (ref 0–32)
AST: 21 IU/L (ref 0–40)
Albumin/Globulin Ratio: 0.8 — ABNORMAL LOW (ref 1.2–2.2)
Albumin: 2.8 g/dL — ABNORMAL LOW (ref 3.6–4.8)
Alkaline Phosphatase: 67 IU/L (ref 39–117)
BUN/Creatinine Ratio: 20 (ref 12–28)
BUN: 32 mg/dL — ABNORMAL HIGH (ref 8–27)
Bilirubin Total: <0.2 mg/dL (ref 0.0–1.2)
CO2: 22 mmol/L (ref 20–29)
Calcium: 8.3 mg/dL — ABNORMAL LOW (ref 8.7–10.3)
Chloride: 101 mmol/L (ref 96–106)
Creatinine, Ser: 1.6 mg/dL — ABNORMAL HIGH (ref 0.57–1.00)
GFR calc Af Amer: 39 mL/min/{1.73_m2} — ABNORMAL LOW (ref >59)
GFR calc non Af Amer: 34 mL/min/{1.73_m2} — ABNORMAL LOW (ref >59)
Globulin, Total: 3.7 g/dL (ref 1.5–4.5)
Glucose: 49 mg/dL — ABNORMAL LOW (ref 65–99)
Potassium: 5 mmol/L (ref 3.5–5.2)
Sodium: 140 mmol/L (ref 134–144)
Total Protein: 6.5 g/dL (ref 6.0–8.5)

## 2018-10-26 LAB — HEMOGLOBIN A1C
Est. average glucose Bld gHb Est-mCnc: 177 mg/dL
Hgb A1c MFr Bld: 7.8 % — ABNORMAL HIGH (ref 4.8–5.6)

## 2018-10-26 LAB — CBC
Hematocrit: 24.8 % — ABNORMAL LOW (ref 34.0–46.6)
Hemoglobin: 7.9 g/dL — ABNORMAL LOW (ref 11.1–15.9)
MCH: 28.9 pg (ref 26.6–33.0)
MCHC: 31.9 g/dL (ref 31.5–35.7)
MCV: 91 fL (ref 79–97)
Platelets: 170 10*3/uL (ref 150–450)
RBC: 2.73 x10E6/uL — CL (ref 3.77–5.28)
RDW: 13 % (ref 11.7–15.4)
WBC: 6.8 10*3/uL (ref 3.4–10.8)

## 2018-10-26 NOTE — Patient Outreach (Addendum)
[facility] Care Management  10/26/2018   63 year old female outreached by [place] services for a 30 day post discharge medication review.  PMHx includes, but not limited to, hypertension, atrial fibrillation, diabetes type 2, CHF.   Reason for call: post discharge medication review  Unsuccessful telephone call attempt #3  to patient.    Plan:  Will route note to [facility] Pharmacist, Joetta Manners.  Gwenlyn Found, Sherian Rein D PGY1 Pharmacy Resident  Phone [phone] 10/26/2018   9:18 AM  Addendum:  [place] case is being closed due to the following reasons:  We have been unable to establish and/or maintain contact with the patient.   Joetta Manners, PharmD Clinical Pharmacist [place] [phone]

## 2018-10-26 NOTE — Telephone Encounter (Signed)
Ok,THX

## 2018-10-26 NOTE — Telephone Encounter (Signed)
Patient has appointment tomorrow 10/27/2018 at 11:30am

## 2018-10-26 NOTE — Telephone Encounter (Signed)
[facility] from [facility] called and left voicemail that she was out to the patient's home for [facility] line care and the patient had some wheezing, productive new cough, and low-grade temp of 99.1. Currently on IV antibiotic and scheduled to see ID on 11/03/2017. RN also sent a call to her primary.

## 2018-10-27 ENCOUNTER — Ambulatory Visit: Payer: PPO | Attending: Infectious Diseases | Admitting: Infectious Diseases

## 2018-10-27 ENCOUNTER — Other Ambulatory Visit: Payer: Self-pay

## 2018-10-27 ENCOUNTER — Encounter: Payer: Self-pay | Admitting: Emergency Medicine

## 2018-10-27 ENCOUNTER — Emergency Department: Payer: PPO

## 2018-10-27 ENCOUNTER — Emergency Department
Admission: EM | Admit: 2018-10-27 | Discharge: 2018-10-27 | Disposition: A | Discharge status: Home / Self Care | Payer: PPO | Attending: Emergency Medicine | Admitting: Emergency Medicine

## 2018-10-27 ENCOUNTER — Encounter: Payer: Self-pay | Admitting: Infectious Diseases

## 2018-10-27 ENCOUNTER — Ambulatory Visit (INDEPENDENT_AMBULATORY_CARE_PROVIDER_SITE_OTHER): Payer: PPO | Admitting: Internal Medicine

## 2018-10-27 ENCOUNTER — Encounter: Payer: Self-pay | Admitting: Internal Medicine

## 2018-10-27 VITALS — BP 126/50 | HR 81 | Ht 68.0 in | Wt 307.0 lb

## 2018-10-27 VITALS — BP 110/66 | HR 60 | Temp 97.4°F | Wt 307.0 lb

## 2018-10-27 DIAGNOSIS — Z95810 Presence of automatic (implantable) cardiac defibrillator: Secondary | ICD-10-CM | POA: Diagnosis not present

## 2018-10-27 DIAGNOSIS — B9561 Methicillin susceptible Staphylococcus aureus infection as the cause of diseases classified elsewhere: Secondary | ICD-10-CM | POA: Diagnosis not present

## 2018-10-27 DIAGNOSIS — E119 Type 2 diabetes mellitus without complications: Secondary | ICD-10-CM | POA: Insufficient documentation

## 2018-10-27 DIAGNOSIS — I2510 Atherosclerotic heart disease of native coronary artery without angina pectoris: Secondary | ICD-10-CM | POA: Diagnosis not present

## 2018-10-27 DIAGNOSIS — M869 Osteomyelitis, unspecified: Secondary | ICD-10-CM

## 2018-10-27 DIAGNOSIS — Z955 Presence of coronary angioplasty implant and graft: Secondary | ICD-10-CM | POA: Diagnosis not present

## 2018-10-27 DIAGNOSIS — I5022 Chronic systolic (congestive) heart failure: Secondary | ICD-10-CM | POA: Diagnosis not present

## 2018-10-27 DIAGNOSIS — I509 Heart failure, unspecified: Secondary | ICD-10-CM | POA: Diagnosis not present

## 2018-10-27 DIAGNOSIS — E8770 Fluid overload, unspecified: Secondary | ICD-10-CM | POA: Diagnosis not present

## 2018-10-27 DIAGNOSIS — M7989 Other specified soft tissue disorders: Secondary | ICD-10-CM | POA: Diagnosis not present

## 2018-10-27 DIAGNOSIS — D649 Anemia, unspecified: Secondary | ICD-10-CM

## 2018-10-27 DIAGNOSIS — G4733 Obstructive sleep apnea (adult) (pediatric): Secondary | ICD-10-CM | POA: Diagnosis not present

## 2018-10-27 DIAGNOSIS — I5043 Acute on chronic combined systolic (congestive) and diastolic (congestive) heart failure: Secondary | ICD-10-CM | POA: Insufficient documentation

## 2018-10-27 DIAGNOSIS — Z7901 Long term (current) use of anticoagulants: Secondary | ICD-10-CM | POA: Insufficient documentation

## 2018-10-27 DIAGNOSIS — R7881 Bacteremia: Secondary | ICD-10-CM

## 2018-10-27 DIAGNOSIS — J189 Pneumonia, unspecified organism: Secondary | ICD-10-CM | POA: Diagnosis not present

## 2018-10-27 DIAGNOSIS — A419 Sepsis, unspecified organism: Secondary | ICD-10-CM | POA: Diagnosis not present

## 2018-10-27 DIAGNOSIS — Z89422 Acquired absence of other left toe(s): Secondary | ICD-10-CM

## 2018-10-27 DIAGNOSIS — I252 Old myocardial infarction: Secondary | ICD-10-CM

## 2018-10-27 DIAGNOSIS — Z79899 Other long term (current) drug therapy: Secondary | ICD-10-CM

## 2018-10-27 DIAGNOSIS — Z7982 Long term (current) use of aspirin: Secondary | ICD-10-CM | POA: Insufficient documentation

## 2018-10-27 DIAGNOSIS — Z792 Long term (current) use of antibiotics: Secondary | ICD-10-CM

## 2018-10-27 DIAGNOSIS — I110 Hypertensive heart disease with heart failure: Secondary | ICD-10-CM | POA: Insufficient documentation

## 2018-10-27 DIAGNOSIS — N189 Chronic kidney disease, unspecified: Secondary | ICD-10-CM | POA: Diagnosis not present

## 2018-10-27 DIAGNOSIS — Z794 Long term (current) use of insulin: Secondary | ICD-10-CM | POA: Insufficient documentation

## 2018-10-27 DIAGNOSIS — R0602 Shortness of breath: Secondary | ICD-10-CM | POA: Diagnosis not present

## 2018-10-27 DIAGNOSIS — Z8701 Personal history of pneumonia (recurrent): Secondary | ICD-10-CM

## 2018-10-27 DIAGNOSIS — A4902 Methicillin resistant Staphylococcus aureus infection, unspecified site: Secondary | ICD-10-CM | POA: Diagnosis not present

## 2018-10-27 DIAGNOSIS — I255 Ischemic cardiomyopathy: Secondary | ICD-10-CM | POA: Diagnosis not present

## 2018-10-27 LAB — CUP PACEART REMOTE DEVICE CHECK
Battery Remaining Longevity: 34 mo
Battery Voltage: 2.95 V
Brady Statistic AP VP Percent: 38.61 %
Brady Statistic AS VP Percent: 60.68 %
Brady Statistic RA Percent Paced: 38.28 %
Brady Statistic RV Percent Paced: 97.9 %
Date Time Interrogation Session: 20200120083624
HighPow Impedance: 52 Ohm
Implantable Lead Implant Date: 20150930
Implantable Lead Implant Date: 20150930
Implantable Lead Location: 753857
Implantable Lead Location: 753860
Implantable Lead Model: 4398
Implantable Lead Model: 5076
Lead Channel Impedance Value: 266 Ohm
Lead Channel Impedance Value: 304 Ohm
Lead Channel Impedance Value: 304 Ohm
Lead Channel Impedance Value: 323 Ohm
Lead Channel Impedance Value: 323 Ohm
Lead Channel Impedance Value: 342 Ohm
Lead Channel Impedance Value: 380 Ohm
Lead Channel Impedance Value: 399 Ohm
Lead Channel Impedance Value: 494 Ohm
Lead Channel Impedance Value: 513 Ohm
Lead Channel Impedance Value: 532 Ohm
Lead Channel Impedance Value: 532 Ohm
Lead Channel Impedance Value: 532 Ohm
Lead Channel Pacing Threshold Amplitude: 0.625 V
Lead Channel Pacing Threshold Amplitude: 0.625 V
Lead Channel Pacing Threshold Amplitude: 0.75 V
Lead Channel Pacing Threshold Pulse Width: 0.4 ms
Lead Channel Pacing Threshold Pulse Width: 0.4 ms
Lead Channel Sensing Intrinsic Amplitude: 1 mV
Lead Channel Sensing Intrinsic Amplitude: 1 mV
Lead Channel Sensing Intrinsic Amplitude: 24 mV
Lead Channel Sensing Intrinsic Amplitude: 24 mV
Lead Channel Setting Pacing Amplitude: 1.5 V
Lead Channel Setting Pacing Amplitude: 2 V
Lead Channel Setting Pacing Pulse Width: 0.4 ms
Lead Channel Setting Pacing Pulse Width: 0.4 ms
Lead Channel Setting Sensing Sensitivity: 0.3 mV
MDC IDC LEAD IMPLANT DT: 20150930
MDC IDC LEAD LOCATION: 753859
MDC IDC MSMT LEADCHNL LV PACING THRESHOLD PULSEWIDTH: 0.4 ms
MDC IDC PG IMPLANT DT: 20150930
MDC IDC SET LEADCHNL RV PACING AMPLITUDE: 2 V
MDC IDC STAT BRADY AP VS PERCENT: 0.02 %
MDC IDC STAT BRADY AS VS PERCENT: 0.68 %

## 2018-10-27 LAB — CBC
HEMATOCRIT: 25.5 % — AB (ref 36.0–46.0)
Hemoglobin: 7.9 g/dL — ABNORMAL LOW (ref 12.0–15.0)
MCH: 28.7 pg (ref 26.0–34.0)
MCHC: 31 g/dL (ref 30.0–36.0)
MCV: 92.7 fL (ref 80.0–100.0)
Platelets: 155 10*3/uL (ref 150–400)
RBC: 2.75 MIL/uL — ABNORMAL LOW (ref 3.87–5.11)
RDW: 13.9 % (ref 11.5–15.5)
WBC: 3.8 10*3/uL — ABNORMAL LOW (ref 4.0–10.5)
nRBC: 0 % (ref 0.0–0.2)

## 2018-10-27 LAB — TROPONIN I
TROPONIN I: 0.08 ng/mL — AB (ref <0.03)
Troponin I: 0.08 ng/mL (ref <0.03)

## 2018-10-27 LAB — BASIC METABOLIC PANEL
Anion gap: 8 (ref 5–15)
BUN: 45 mg/dL — AB (ref 8–23)
CO2: 25 mmol/L (ref 22–32)
Calcium: 8.2 mg/dL — ABNORMAL LOW (ref 8.9–10.3)
Chloride: 101 mmol/L (ref 98–111)
Creatinine, Ser: 1.62 mg/dL — ABNORMAL HIGH (ref 0.44–1.00)
GFR calc Af Amer: 38 mL/min — ABNORMAL LOW (ref >60)
GFR calc non Af Amer: 33 mL/min — ABNORMAL LOW (ref >60)
Glucose, Bld: 83 mg/dL (ref 70–99)
Potassium: 4.7 mmol/L (ref 3.5–5.1)
Sodium: 134 mmol/L — ABNORMAL LOW (ref 135–145)

## 2018-10-27 LAB — BRAIN NATRIURETIC PEPTIDE: B NATRIURETIC PEPTIDE 5: 1546 pg/mL — AB (ref 0.0–100.0)

## 2018-10-27 MED ORDER — FUROSEMIDE 10 MG/ML IJ SOLN
40.0000 mg | Freq: Once | INTRAMUSCULAR | Status: AC
  Administered 2018-10-27: 40 mg via INTRAVENOUS
  Filled 2018-10-27: qty 4

## 2018-10-27 MED ORDER — TORSEMIDE 20 MG PO TABS: ORAL_TABLET | ORAL | 2 refills | Status: DC

## 2018-10-27 MED ORDER — SODIUM CHLORIDE 0.9% FLUSH: 3.0000 mL | Freq: Once | INTRAVENOUS | Status: DC

## 2018-10-27 NOTE — ED Triage Notes (Signed)
Says was sent by dr for anemia.  Says was also told that she has a lot of fluid on her.  Says she has been short of breath.

## 2018-10-27 NOTE — Patient Instructions (Signed)
You are here for follow up You had MSSa bacteremia and left 2nd toe infection and had amputation your TEE was neg, but you have ICD-your antibiotic will finish 11/10/18.Will check Blood culture once every 2 weeks and then 4 weeks for 3-4 months. You have congestive heart failure and also have excess fluid as your weight has gone up by 30 pounds- Your heart doctor has increased your water pill. Will keep an eye on your creatinine which is currently 1.6 and may go up with diuretics- Will adjust cefazolin dose as needed

## 2018-10-27 NOTE — Progress Notes (Signed)
Review    Electrophysiology Office Note   Date:  10/27/2018   ID:  [name], DOB Jun 28, 1956, MRN [id number]  PCP:  Rutherford Guys, MD  Cardiologist:  CHF Primary Electrophysiologist:    Virl Axe, MD    Chief Complaint  Patient presents with  . other    f/u echo/left cath/TEE for arrhythmia.SOB with exertion.Medications reviewed verbally with patient.     History of Present Illness: [name] is a 63 y.o. female  seen today for follow-up of CRT-D implantation undertaken 9/15. 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 There is marked interval improvement in LV function   She has morbid obesity.    She has OSA use of her CPAP was associated with erosion over her nose.  Denies chest pain or edema.  Major complaint is shortness of breath and worsening fatigue over the last couple of months.  She has long-standing low blood pressure.  She has been maintained on losartan presumably for diabetic protection by her nephrologist.    Hospitalized a number of times December/January.  Hospitalizations notable for MSSA bacteremia with a negative TEE antibiotics with a PICC line about to conclude; atrial tachycardia treated with amiodarone Also noted to be quite anemic.  Weight is up since she went home hospitalization weight on December 24 was 275.  Today 305.    Date Cr K TSH Hgb  7/17 1.53 5.0    8/18 1.96 5.4    7/19   1.46(4/19) 12.3  10/19 2.00 4.5    1/20 1.6 5.0  7.9 (Ferritin 501)           DATE TEST EF   9/15  echo  30 %   1 /17 echo  50 %   12/19 Echo  45-50%   12/19 TEE  No vegetations  12/19 LHC  pLAD80 with ISR; mLAD70 pCx 80;D1 77 mRCA 40      Past Medical History:  Diagnosis Date  . Acute myocardial infarction, subendocardial infarction, initial episode of [name] ([name]) 07/21/2012  . Acute osteomyelitis involving ankle and foot ([place]) 02/06/2015  . Acute systolic heart failure ([name])  07/21/2012   New onset 07/19/12; admission to [name]. Elevated Troponins.  S/p 2D-echo with EF 20-25%.  S/p cardiac catheterization with stenting LAD.  Repeat 2D-echo 10/2011 with improved EF of 35%.   . Anemia   . Automatic implantable cardioverter-defibrillator in situ    a. MDT CRT-D 06/2014, SN: [id number]    . CAD (coronary artery disease)    a. cardiac cath 101/04/2012: PCI/DES to chronically occluded mLAD, consideration PCI to diag branch in 4 weeks.   . Cataract   . Chicken pox   . Chronic systolic CHF (congestive heart failure) (HCC)    a. mixed ICM & NICM; b. EF 20-25% by echo 07/2012, mid-dist 2/3 of LV sev HK/AK, mild MR. echo 10/2012: EF 30-35%, sev HK ant-septal & inf walls, GR1DD, mild MR, PASP 33. [name]. echo 02/2013: EF 30%, GR1DD, mild MR. echo 04/2014: EF 30%, Septal-lat dyssynchrony, global HK, inf AK, GR1DD, mild MR. d. echo 10/2014: EF50-55%, WM nl, GR1DD, septal mild paradox. e. echo 02/2015: EF 50-55%, wm   . Depression   . Heart attack ([name])   . Heel ulcer ([name]) 04/27/2015  . History of blood transfusion ~ 2011   "plasma; had neuropathy; couldn't walk"  . Hypertension   . LBBB (left bundle branch block)   . Neuromuscular  disorder ([name])   . Neuropathy 2011  . Obesity, unspecified   . OSA on CPAP    Moderate with AHI 23/hr and now on CPAP at 16cm H2O.  Her DME is [facility]  . Pure hypercholesterolemia   . Type II diabetes mellitus ([name])   . Unspecified vitamin D deficiency    Past Surgical History:  Procedure Laterality Date  . AMPUTATION TOE Right 06/18/2015   Procedure: AMPUTATION TOE;  Surgeon: Samara Deist, DPM;  Location: [facility];  Service: Podiatry;  Laterality: Right;  . AMPUTATION TOE Left 10/08/2018   Procedure: AMPUTATION TOE LEFT 2ND;  Surgeon: Albertine Patricia, DPM;  Location: [facility];  Service: Podiatry;  Laterality: Left;  . BACK SURGERY    . BI-VENTRICULAR IMPLANTABLE CARDIOVERTER DEFIBRILLATOR N/A 07/06/2014   Procedure: BI-VENTRICULAR IMPLANTABLE  CARDIOVERTER DEFIBRILLATOR  (CRT-D);  Surgeon: Deboraha Sprang, MD;  Location: [name];  Service: Cardiovascular;  Laterality: N/A;  . BI-VENTRICULAR IMPLANTABLE CARDIOVERTER DEFIBRILLATOR  (CRT-D)  07/06/2014  . BILATERAL OOPHORECTOMY  01/2011   ovarian cyst benign  . COLONOSCOPY WITH PROPOFOL Left 02/22/2015   Procedure: COLONOSCOPY WITH PROPOFOL;  Surgeon: Hulen Luster, MD;  Location: [name];  Service: Endoscopy;  Laterality: Left;  . CORONARY ANGIOPLASTY WITH STENT PLACEMENT Left 07/2012   new onset systolic CHF; elevated troponins.  Cardiac catheterization with stenting to LAD; EF 15%.  2D-echo: EF 20-25%.  . ESOPHAGOGASTRODUODENOSCOPY N/A 02/22/2015   Procedure: ESOPHAGOGASTRODUODENOSCOPY (EGD);  Surgeon: Hulen Luster, MD;  Location: [name];  Service: Endoscopy;  Laterality: N/A;  . INCISION AND DRAINAGE ABSCESS Right 2007   groin; with ICU stay due to sepsis.  Marland Kitchen LAPAROSCOPIC CHOLECYSTECTOMY  2011  . LEFT HEART CATH AND CORONARY ANGIOGRAPHY N/A 10/05/2018   Procedure: LEFT HEART CATH AND CORONARY ANGIOGRAPHY;  Surgeon: Minna Merritts, MD;  Location: [name];  Service: Cardiovascular;  Laterality: N/A;  . LEFT HEART CATHETERIZATION WITH CORONARY ANGIOGRAM N/A 07/21/2012   Procedure: LEFT HEART CATHETERIZATION WITH CORONARY ANGIOGRAM;  Surgeon: Jolaine Artist, MD;  [name];  Service: Cardiovascular;  Laterality: N/A;  . [place]   L4-5  . PERCUTANEOUS CORONARY STENT INTERVENTION (PCI-S) N/A 07/23/2012   Procedure: PERCUTANEOUS CORONARY STENT INTERVENTION (PCI-S);  Surgeon: Sherren Mocha, MD;  [name];  Service: Cardiovascular;  Laterality: N/A;  . PERIPHERAL VASCULAR BALLOON ANGIOPLASTY Left 10/06/2018   Procedure: PERIPHERAL VASCULAR BALLOON ANGIOPLASTY;  Surgeon: Katha Cabal, MD;  Location: [name];  Service: Cardiovascular;  Laterality: Left;  . PERIPHERAL VASCULAR CATHETERIZATION N/A  02/10/2015   Procedure: Picc Line Insertion;  Surgeon: Katha Cabal, MD;  Location: [name];  Service: Cardiovascular;  Laterality: N/A;  . TEE WITHOUT CARDIOVERSION N/A 10/02/2018   Procedure: TRANSESOPHAGEAL ECHOCARDIOGRAM (TEE);  Surgeon: Wellington Hampshire, MD;  Location: [facility];  Service: Cardiovascular;  Laterality: N/A;  . [place]  . VAGINAL HYSTERECTOMY  01/2011   Fibroids/DUB.  Ovaries removed. [place].     Current Outpatient Medications  Medication Sig Dispense Refill  . AMBULATORY NON FORMULARY MEDICATION 1 Units by Other route once. Medication Name: foot brace 1 Units 0  . amiodarone (PACERONE) 200 MG tablet Take 1 tablet (200 mg total) by mouth 2 (two) times daily. 60 tablet 0  . aspirin EC 81 MG EC tablet Take 1 tablet (81 mg total) by mouth daily. 90 tablet 0  . Calcium Carb-Cholecalciferol (CALCIUM 600 + D PO)  Take 1 tablet by mouth 2 (two) times daily.    . carvedilol (COREG) 12.5 MG tablet Take 1 tablet (12.5 mg total) by mouth 2 (two) times daily with a meal. 60 tablet 0  . ceFAZolin (ANCEF) IVPB Inject 2 g into the vein every 8 (eight) hours. Indication: MSSA bacteremia and toe infection Last Day of Therapy:  11/10/2018 Labs - Once weekly:  CBC/D and BMP, Labs - Every other week:  ESR and CRP 93 Units 0  . citalopram (CELEXA) 20 MG tablet Take 1 tablet (20 mg total) by mouth daily. 90 tablet 1  . clopidogrel (PLAVIX) 75 MG tablet Take 1 tablet (75 mg total) by mouth daily. 90 tablet 0  . ferrous sulfate 325 (65 FE) MG tablet Take 325 mg by mouth daily.    Marland Kitchen glimepiride (AMARYL) 4 MG tablet Take 1 tablet (4 mg total) by mouth 2 (two) times daily. 60 tablet 3  . glucose blood (ONE TOUCH ULTRA TEST) test strip Check sugar three times daily dx: DMII uncontrolled insulin dependent with complications 644 each 11  . HYDROcodone-acetaminophen (NORCO/VICODIN) 5-325 MG tablet Take 1-2 tablets by mouth every 6 (six) hours as needed for severe pain. 20  tablet 0  . insulin glargine (LANTUS) 100 UNIT/ML injection Inject 0.16 mLs (16 Units total) into the skin at bedtime. 100 mL 1  . Insulin Pen Needle (B-D ULTRAFINE III SHORT PEN) 31G X 8 MM MISC USE AS DIRECTED WITH LANTUS; dx: DMII uncontrolled, insulin dependent, with complications 034 each 11  . lidocaine (LIDODERM) 5 % Place 1 patch onto the skin daily. Remove & Discard patch within 12 hours or as directed by MD 30 patch 0  . losartan (COZAAR) 25 MG tablet Take 1 tablet (25 mg total) by mouth daily. 60 tablet 0  . nitroGLYCERIN (NITROSTAT) 0.4 MG SL tablet Place 1 tablet (0.4 mg total) under the tongue every 5 (five) minutes as needed for chest pain. 25 tablet 3  . omega-3 acid ethyl esters (LOVAZA) 1 g capsule Take 1 g by mouth 2 (two) times daily.    . protein supplement shake (PREMIER PROTEIN) LIQD Take 325 mLs (11 oz total) by mouth 2 (two) times daily between meals. 90 Can 0  . rosuvastatin (CRESTOR) 40 MG tablet TAKE 1 TABLET BY MOUTH ONCE DAILY 90 tablet 3  . sodium chloride flush (NS) 0.9 % SOLN 20 mLs by Intracatheter route every 8 (eight) hours. 100 Syringe 0  . torsemide (DEMADEX) 20 MG tablet Take 1 tablet (20 mg total) by mouth daily. (Patient taking differently: Take 20 mg by mouth at bedtime. ) 90 tablet 1   No current facility-administered medications for this visit.     Allergies:   Patient has no known allergies.   Social History:  The patient  reports that she has never smoked. She has never used smokeless tobacco. She reports that she does not drink alcohol or use drugs.   Family History:  The patient's family history includes Arthritis in her mother; COPD in her maternal grandmother; Cancer in her father and maternal grandfather; Cancer (age of onset: 55) in her brother; Diabetes in her maternal grandmother, mother, paternal grandfather, paternal grandmother, and son; Heart disease in her maternal grandmother; Hypertension in her mother and son; Obesity in her brother.     ROS:  Please see the history of present illness.   .   All other systems are reviewed and negative.    PHYSICAL EXAM: VS:  BP Marland Kitchen)  126/50 (BP Location: Right Arm, Patient Position: Sitting, Cuff Size: Large)   Pulse 81   Ht 5' 8"  (1.727 m)   Wt (!) 307 lb (139.3 kg)   BMI 46.68 kg/m  , BMI Body mass index is 46.68 kg/m. Well developed and nourished in no acute distress HENT normal Neck supple with JVP 8-10 Clear but derccreased Device pocket well healed; without hematoma or erythema.  There is no tethering  Regular rate and rhythm, no murmurs or gallops Abd-soft with active BS No Clubbing cyanosis 3+ edema Skin-warm and dry A & Oriented  Grossly normal sensory and motor function    EKG: Sinus with P synchronous pacing.  By V configuration. On paced ECG QRS duration 180 ms.  ECGs from 12/26 demonstrates atrial tachycardia with variable conduction associate with adenosine infusion  Device interrogation is reviewed today in detail.  See PaceArt for details.   Recent Labs: 09/29/2018: B Natriuretic Peptide 1,758.0 10/02/2018: Magnesium 1.8; TSH 2.288 10/22/2018: ALT <5; BUN 32; Creatinine, Ser 1.60; Hemoglobin 7.9; Platelets 170; Potassium 5.0; Sodium 140    Lipid Panel     Component Value Date/Time   CHOL 91 (L) 04/15/2018 1532   TRIG 137 04/15/2018 1532   HDL 32 (L) 04/15/2018 1532   CHOLHDL 2.8 04/15/2018 1532   CHOLHDL 5.6 (H) 08/06/2016 1431   VLDL 34 (H) 08/06/2016 1431   LDLCALC 32 04/15/2018 1532     Wt Readings from Last 3 Encounters:  10/27/18 (!) 307 lb (139.3 kg)  10/16/18 (!) 300 lb 7.8 oz (136.3 kg)  10/09/18 295 lb 14.4 oz (134.2 kg)      Other studies Reviewed:    ASSESSMENT AND PLAN: Ischemic cardiomyopathy-interval normalization   CRT-D  The patient's device was interrogated.  The information was reviewed. No changes were made in the programming.      MSSA bacteremia  CHF acute/chronic diastolic  Abnormal QRS  vector  Fatigue  Low blood pressure  Sleep apnea-untreated  Atrial tachycardia   Renal Insufficiency  Followed closely by renal  Patient is massively volume overloaded.  Have encouraged her to decrease p.o. intake and use sugar-free hard candies.  We will increase her Demadex from 20--40 if that does not work we will go from 40--60.  I have advised her that if we are unable to diurese her she may need to be hospitalized.  We will arrange for follow-up in 3 weeks.  She will need a metabolic profile in about 10 days.  She is suffering from cough incontinence.  Part of this may be related to heart failure and the diuretics may help.  Otherwise, she may need urology.   She is currently 4 weeks into her MSSA bacteremia therapy.  TEE was negative.  Literature review today demonstrates that about half the time the device is a not infected and can be managed expectantly.  She should have serial cultures.  We will wait to hear from ID about this.  Her atrial tachycardia burden per histograms is scant.  Hence, we will discontinue her amiodarone.  More than 50% of 40 min was spent in counseling related to the above    Signed, Virl Axe, MD  10/27/2018 10:23 AM     [name] [address] [place] [address] [phone] (office) [phone] (fax)

## 2018-10-27 NOTE — ED Notes (Signed)
[facility] sent to lab

## 2018-10-27 NOTE — Progress Notes (Signed)
NAME: DORTHY MAGNUSSEN  DOB: [birth year]  MRN: [id number]  Date/Time: 10/27/2018 11:43 AM Subjective:  Follow up visit after recent hospitalization ? [name] is a 63 y.o.female  With CAD, AICD staph aureus bacteremia, left second toe infection was in the hospital between 09/28/2018 until 10/09/2018.  During that hospitalization she had amputation of the second toe on 10/08/2018.  She had TEE which was negative for endocarditis or thrombus on the AICD wire.  She also had NSTEMI and was medically managed.  She had AKA on CKD which improved gradually and on discharge her creatinine was 1.6.  She was sent home on cefazolin 2 g IV every 8 to finish 6 weeks on 11/10/2018.  On 1 6 she was rehospitalized for cough and shortness of breath and was diagnosed with a right upper lobe pneumonia and was initially treated with cefepime in the hospital and was given Levaquin for 3 days when she went home.  She saw her cardiologist today and was noted to be massively fluid overloaded and he has increased her diuretics from 20 mg to 40 mg and if that would not work to increase to 60 mg.  She saw me today for the infection  follow-up.  She complains of some cough, white sputum and shortness of breath and also PND.  She has no fever or chills.  She will be seeing Dr. Elvina Mattes tomorrow for removal of the sutures on the left foot. Past Medical History:  Diagnosis Date  . Acute myocardial infarction, subendocardial infarction, initial episode of care ([place]) 07/21/2012  . Acute osteomyelitis involving ankle and foot ([place]) 02/06/2015  . Acute systolic heart failure ([place]) 07/21/2012   New onset 07/19/12; admission to [facility] ED. Elevated Troponins.  S/p 2D-echo with EF 20-25%.  S/p cardiac catheterization with stenting LAD.  Repeat 2D-echo 10/2011 with improved EF of 35%.   . Anemia   . Automatic implantable cardioverter-defibrillator in situ    a. MDT CRT-D 06/2014, SN: [id number]    . CAD (coronary artery disease)    a.  cardiac cath 101/04/2012: PCI/DES to chronically occluded mLAD, consideration PCI to diag branch in 4 weeks.   . Cataract   . Chicken pox   . Chronic systolic CHF (congestive heart failure) (HCC)    a. mixed ICM & NICM; b. EF 20-25% by echo 07/2012, mid-dist 2/3 of LV sev HK/AK, mild MR. echo 10/2012: EF 30-35%, sev HK ant-septal & inf walls, GR1DD, mild MR, PASP 33. c. echo 02/2013: EF 30%, GR1DD, mild MR. echo 04/2014: EF 30%, Septal-lat dyssynchrony, global HK, inf AK, GR1DD, mild MR. d. echo 10/2014: EF50-55%, WM nl, GR1DD, septal mild paradox. e. echo 02/2015: EF 50-55%, wm   . Depression   . Heart attack ([place])   . Heel ulcer ([place]) 04/27/2015  . History of blood transfusion ~ 2011   "plasma; had neuropathy; couldn't walk"  . Hypertension   . LBBB (left bundle branch block)   . Neuromuscular disorder ([place])   . Neuropathy 2011  . Obesity, unspecified   . OSA on CPAP    Moderate with AHI 23/hr and now on CPAP at 16cm H2O.  Her DME is [facility]  . Pure hypercholesterolemia   . Type II diabetes mellitus ([place])   . Unspecified vitamin D deficiency     Past Surgical History:  Procedure Laterality Date  . AMPUTATION TOE Right 06/18/2015   Procedure: AMPUTATION TOE;  Surgeon: Samara Deist, DPM;  Location: [facility] ORS;  Service: Podiatry;  Laterality: Right;  . AMPUTATION TOE Left 10/08/2018   Procedure: AMPUTATION TOE LEFT 2ND;  Surgeon: Albertine Patricia, DPM;  Location: [facility] ORS;  Service: Podiatry;  Laterality: Left;  . BACK SURGERY    . BI-VENTRICULAR IMPLANTABLE CARDIOVERTER DEFIBRILLATOR N/A 07/06/2014   Procedure: BI-VENTRICULAR IMPLANTABLE CARDIOVERTER DEFIBRILLATOR  (CRT-D);  Surgeon: Deboraha Sprang, MD;  Location: [facility] CATH LAB;  Service: Cardiovascular;  Laterality: N/A;  . BI-VENTRICULAR IMPLANTABLE CARDIOVERTER DEFIBRILLATOR  (CRT-D)  07/06/2014  . BILATERAL OOPHORECTOMY  01/2011   ovarian cyst benign  . COLONOSCOPY WITH PROPOFOL Left 02/22/2015   Procedure: COLONOSCOPY WITH PROPOFOL;  Surgeon:  Hulen Luster, MD;  Location: [facility] ENDOSCOPY;  Service: Endoscopy;  Laterality: Left;  . CORONARY ANGIOPLASTY WITH STENT PLACEMENT Left 07/2012   new onset systolic CHF; elevated troponins.  Cardiac catheterization with stenting to LAD; EF 15%.  2D-echo: EF 20-25%.  . ESOPHAGOGASTRODUODENOSCOPY N/A 02/22/2015   Procedure: ESOPHAGOGASTRODUODENOSCOPY (EGD);  Surgeon: Hulen Luster, MD;  Location: [facility] ENDOSCOPY;  Service: Endoscopy;  Laterality: N/A;  . INCISION AND DRAINAGE ABSCESS Right 2007   groin; with ICU stay due to sepsis.  Marland Kitchen LAPAROSCOPIC CHOLECYSTECTOMY  2011  . LEFT HEART CATH AND CORONARY ANGIOGRAPHY N/A 10/05/2018   Procedure: LEFT HEART CATH AND CORONARY ANGIOGRAPHY;  Surgeon: Minna Merritts, MD;  Location: [place] CV LAB;  Service: Cardiovascular;  Laterality: N/A;  . LEFT HEART CATHETERIZATION WITH CORONARY ANGIOGRAM N/A 07/21/2012   Procedure: LEFT HEART CATHETERIZATION WITH CORONARY ANGIOGRAM;  Surgeon: Jolaine Artist, MD;  Location: [facility] CATH LAB;  Service: Cardiovascular;  Laterality: N/A;  . [place]   L4-5  . PERCUTANEOUS CORONARY STENT INTERVENTION (PCI-S) N/A 07/23/2012   Procedure: PERCUTANEOUS CORONARY STENT INTERVENTION (PCI-S);  Surgeon: Sherren Mocha, MD;  Location: [facility] CATH LAB;  Service: Cardiovascular;  Laterality: N/A;  . PERIPHERAL VASCULAR BALLOON ANGIOPLASTY Left 10/06/2018   Procedure: PERIPHERAL VASCULAR BALLOON ANGIOPLASTY;  Surgeon: Katha Cabal, MD;  Location: [place] CV LAB;  Service: Cardiovascular;  Laterality: Left;  . PERIPHERAL VASCULAR CATHETERIZATION N/A 02/10/2015   Procedure: Picc Line Insertion;  Surgeon: Katha Cabal, MD;  Location: [place] CV LAB;  Service: Cardiovascular;  Laterality: N/A;  . TEE WITHOUT CARDIOVERSION N/A 10/02/2018   Procedure: TRANSESOPHAGEAL ECHOCARDIOGRAM (TEE);  Surgeon: Wellington Hampshire, MD;  Location: [facility] ORS;  Service: Cardiovascular;  Laterality: N/A;  . [place]    . VAGINAL HYSTERECTOMY  01/2011   Fibroids/DUB.  Ovaries removed. [place].    Social History   Socioeconomic History  . Marital status: Married    Spouse name: [name]  . Number of children: 2  . Years of education: 77  . Highest education level: High school graduate  Occupational History  . Occupation: disabled    Fish farm manager: UNEMPLOYED    Comment: 03/2010 for peripheral neuropathy  . 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  Social Needs  . Financial resource strain: Not hard at all  . Food insecurity:    Worry: Never true    Inability: Never true  . Transportation needs:    Medical: No    Non-medical: No  Tobacco Use  . Smoking status: Never Smoker  . Smokeless tobacco: Never Used  Substance and Sexual Activity  . Alcohol use: No    Alcohol/week: 0.0 standard drinks  . Drug use: No  . Sexual activity: Never    Birth control/protection: Post-menopausal, Surgical  Lifestyle  . Physical activity:  Days per week: 0 days    Minutes per session: 0 min  . Stress: Rather much  Relationships  . Social connections:    Talks on phone: More than three times a week    Gets together: More than three times a week    Attends religious service: Never    Active member of club or organization: No    Attends meetings of clubs or organizations: Never    Relationship status: Married  . Intimate partner violence:    Fear of current or ex partner: No    Emotionally abused: No    Physically abused: No    Forced sexual activity: No  Other Topics Concern  . Not on file  Social History Narrative   Marital status:Married x 45 yrs. Happily married, no abuse.       Children:  2 children (daughter 72, son 46). Two grandsons and 2 step grandchildren.  1 gg.      Lives: with husband, daughter, son-in-law, 2 grandsons, and 1 on the way.      Employment: disability for peripheral neuropathy 2012.  Previously had home daycare.      Tobacco: none      Alcohol: none      Drugs: none       Exercise: none. [employer].      Pets: dog.      Always uses seat belts, smoke detectors in home.      No guns in the home.       Caffeine use: 2 cups coffee per day.       Nutrition: Well balanced diet.    Family History  Problem Relation Age of Onset  . Diabetes Mother   . Hypertension Mother   . Arthritis Mother        knees, lumbar DDD, cervical DDD  . Cancer Father        prostate,skin,lymphoma.  . Cancer Brother 55       bladder cancer; non-smoker  . Diabetes Maternal Grandmother   . Heart disease Maternal Grandmother   . COPD Maternal Grandmother   . Diabetes Paternal Grandmother   . Obesity Brother   . Diabetes Son   . Hypertension Son   . Cancer Maternal Grandfather   . Diabetes Paternal Grandfather    No Known Allergies  ? Current Outpatient Medications  Medication Sig Dispense Refill  . aspirin EC 81 MG EC tablet Take 1 tablet (81 mg total) by mouth daily. 90 tablet 0  . Calcium Carb-Cholecalciferol (CALCIUM 600 + D PO) Take 1 tablet by mouth 2 (two) times daily.    . carvedilol (COREG) 12.5 MG tablet Take 1 tablet (12.5 mg total) by mouth 2 (two) times daily with a meal. 60 tablet 0  . ceFAZolin (ANCEF) IVPB Inject 2 g into the vein every 8 (eight) hours. Indication: MSSA bacteremia and toe infection Last Day of Therapy:  11/10/2018 Labs - Once weekly:  CBC/D and BMP, Labs - Every other week:  ESR and CRP 93 Units 0  . citalopram (CELEXA) 20 MG tablet Take 1 tablet (20 mg total) by mouth daily. 90 tablet 1  . clopidogrel (PLAVIX) 75 MG tablet Take 1 tablet (75 mg total) by mouth daily. 90 tablet 0  . ferrous sulfate 325 (65 FE) MG tablet Take 325 mg by mouth daily.    Marland Kitchen glimepiride (AMARYL) 4 MG tablet Take 1 tablet (4 mg total) by mouth 2 (two) times daily. 60 tablet 3  .  glucose blood (ONE TOUCH ULTRA TEST) test strip Check sugar three times daily dx: DMII uncontrolled insulin dependent with complications 700 each 11  . HYDROcodone-acetaminophen  (NORCO/VICODIN) 5-325 MG tablet Take 1-2 tablets by mouth every 6 (six) hours as needed for severe pain. 20 tablet 0  . insulin glargine (LANTUS) 100 UNIT/ML injection Inject 0.16 mLs (16 Units total) into the skin at bedtime. 100 mL 1  . Insulin Pen Needle (B-D ULTRAFINE III SHORT PEN) 31G X 8 MM MISC USE AS DIRECTED WITH LANTUS; dx: DMII uncontrolled, insulin dependent, with complications 174 each 11  . lidocaine (LIDODERM) 5 % Place 1 patch onto the skin daily. Remove & Discard patch within 12 hours or as directed by MD 30 patch 0  . losartan (COZAAR) 25 MG tablet Take 1 tablet (25 mg total) by mouth daily. 60 tablet 0  . nitroGLYCERIN (NITROSTAT) 0.4 MG SL tablet Place 1 tablet (0.4 mg total) under the tongue every 5 (five) minutes as needed for chest pain. 25 tablet 3  . omega-3 acid ethyl esters (LOVAZA) 1 g capsule Take 1 g by mouth 2 (two) times daily.    . protein supplement shake (PREMIER PROTEIN) LIQD Take 325 mLs (11 oz total) by mouth 2 (two) times daily between meals. 90 Can 0  . rosuvastatin (CRESTOR) 40 MG tablet TAKE 1 TABLET BY MOUTH ONCE DAILY 90 tablet 3  . sodium chloride flush (NS) 0.9 % SOLN 20 mLs by Intracatheter route every 8 (eight) hours. 100 Syringe 0  . torsemide (DEMADEX) 20 MG tablet Take 2 tablets (40 mg) by mouth once daily 180 tablet 2  . AMBULATORY NON FORMULARY MEDICATION 1 Units by Other route once. Medication Name: foot brace 1 Units 0   No current facility-administered medications for this visit.      Abtx:  Anti-infectives (From admission, onward)   None      REVIEW OF SYSTEMS:  Const: negative fever, negative chills, weight gain of 30 pounds Eyes: negative diplopia or visual changes, negative eye pain ENT: negative coryza, negative sore throat Resp:  cough, white sputum, dyspnea GU: negative for frequency, dysuria and hematuria GI: Negative for abdominal pain, diarrhea, bleeding, constipation Skin: negative for rash and pruritus Heme: negative  for easy bruising and gum/nose bleeding MS: Fatigue, generalized  weakness  Neurolo:negative for headaches, dizziness, vertigo, memory problems  Psych: negative for feelings of anxiety, depression  Endocrine: No polyuria Allergy/Immunology- negative for any medication or food allergies ?  Objective:  VITALS:  BP 110/66 (BP Location: Left Arm, Patient Position: Sitting, Cuff Size: Normal)   Pulse 60   Temp (!) 97.4 F (36.3 C) (Oral)   Wt (!) 307 lb (139.3 kg)   SpO2 90% Comment: on room air  BMI 46.68 kg/m  PHYSICAL EXAM:  General: Alert, cooperative, no distress, appears stated age.  Pale, wheel chair Head: Normocephalic, without obvious abnormality, atraumatic. Eyes: Conjunctivae clear, anicteric sclerae. Pupils are equal ENT Nares normal. No drainage or sinus tenderness. Lips, mucosa, and tongue normal. No Thrush Neck: Supple, symmetrical, no adenopathy, thyroid: non tender no carotid bruit and no JVD. Back: No CVA tenderness. Lungs: Bilateral air entry, few rhonchi and crypts scattered Heart: Regular rate and rhythm, no murmur, rub or gallop. AICD site no erythema or tenderness. Abdomen: Not examined Extremities: Edema legs bilateral Second toe left foot has been amputated and the amputation site is with no erythema and has healed very well.  Sutures present     Right PIC site  clean. Skin: No rashes or lesions. Or bruising Lymph: Cervical, supraclavicular normal. Neurologic: Grossly non-focal Pertinent Labs Lab Results From 10/26/2018 BUN 41 Creatinine 1.76 WBC 3.9 Hemoglobin 6.7 Platelet 132 ESR 40  10/20/2018 BUN 34 Creatinine 1.60 WBC 6.6 Hemoglobin 8.1 Platelet 189  Microbiology: 09/28/2018 blood culture staph aureus 09/29/2018 blood culture negative 10/08/2018 culture from the bone of the left toe was staph aureus 10/13/2018 blood culture negative  IMAGING RESULTS: TEE: 10/02/18  No evidence of valve vegetation or wire associated  vegetation  ?  Impression/Recommendation ? MSSA bacteremia with osteomyelitis of the second toe on the left foot which is status post amputation: On treatment.  Will finish cefazolin on 11/10/2018.  Being treated as osteomyelitis of the left foot.  She also has AICD.  TEE was negative for both valvular vegetation and AICD wire thrombus.  As she had a source which was the foot for the MSSA bacteremia and the blood cultures became negative quickly the AICD was not removed .  It would be prudent to keep an eye on the blood cultures after treatment to make sure that staph aureus does not resurface. ? CKD.  While in the hospital she had AKI as well.  The creatinine went back to baseline of 1.6.  But the creatinine from 10/26/2018 is 1.76. We will have to adjust the dose of cefazolin if needed. Worsening hemoglobin on baseline anemia.  The current hemoglobin is 6.7.  This result was obtained after she left my office.  So I called her and asked her to go to the ED.The anemia could be from CKD but my concern is also cefazolin because she also has a borderline WBC and trending down platelet. CBC will have to be repeated.  Congestive heart failure /fluid overload and increasing weight of nearly 30 pounds since December admission.  Seen by cardiologist this morning and torsemide has been increased to 40 mg.  CAD status post stents in 2013.  NSTEMI during recent hospitalization now managed medically.  Recently treated pneumonia when she was in the hospital on 10/12/2018.  The x-ray looked like almost fluid in the fissure.  Wonder whether this was part of CHF. ______________________________________________ Discussed with patient and her husband. After I obtain the CBC results from yesterday I called the patient and asked her to go to the ED.  I also informed her nephrologist, PCP and cardiologist by secure chart. Note:  This document was prepared using Dragon voice recognition software and may include  unintentional dictation errors.

## 2018-10-27 NOTE — ED Notes (Addendum)
Pt stays at 94% with ambulation to door to bed. Pt does report some SOB with walking. x2 assist due o pt normally walks with a walker.

## 2018-10-27 NOTE — Patient Instructions (Addendum)
Medication Instructions:  - Your physician has recommended you make the following change in your medication:   1) STOP amiodarone  2) INCREASE demadex (torsemide) 20 mg- take 2 tablets (40 mg) by mouth once daily   If you need a refill on your cardiac medications before your next appointment, please call your pharmacy.   Lab work: - none ordered  If you have labs (blood work) drawn today and your tests are completely normal, you will receive your results only by: Marland Kitchen MyChart Message (if you have MyChart) OR . A paper copy in the mail If you have any lab test that is abnormal or we need to change your treatment, we will call you to review the results.  Testing/Procedures: - none ordered  Follow-Up: At [facility], you and your health needs are our priority.  As part of our continuing mission to provide you with exceptional heart care, we have created designated Provider Care Teams.  These Care Teams include your primary Cardiologist (physician) and Advanced Practice Providers (APPs -  Physician Assistants and Nurse Practitioners) who all work together to provide you with the care you need, when you need it. . You will need a follow up appointment in September 2020 with Dr. Caryl Comes.  Please call our office 2 months in advance to schedule this appointment.    Remote monitoring is used to monitor your Pacemaker of ICD from home. This monitoring reduces the number of office visits required to check your device to one time per year. It allows [place] to keep an eye on the functioning of your device to ensure it is working properly. You are scheduled for a device check from home on 01/25/19. You may send your transmission at any time that day. If you have a wireless device, the transmission will be sent automatically. After your physician reviews your transmission, you will receive a postcard with your next transmission date.  Your physician recommends that you schedule a follow-up appointment in: 3-4  weeks with the PA/ NP (heart failure follow up).   Any Other Special Instructions Will Be Listed Below (If Applicable). - N/A

## 2018-10-27 NOTE — Progress Notes (Signed)
Remote ICD transmission.   

## 2018-10-27 NOTE — ED Notes (Signed)
Dr Burlene Arnt and charge RN notified of elevated troponin

## 2018-10-27 NOTE — Discharge Instructions (Addendum)
Increase your torsemide to 40 mg daily.  Follow-up with your primary care doctor or cardiologist tomorrow for further evaluation and repeat labs and vitals.  Return to the emergency room for worsening shortness of breath, chest pain or dizziness.

## 2018-10-27 NOTE — Telephone Encounter (Signed)
FYI only.

## 2018-10-27 NOTE — ED Provider Notes (Signed)
[facility] Emergency Department Provider Note  ____________________________________________  Time seen: Approximately 5:57 PM  I have reviewed the triage vital signs and the nursing notes.   HISTORY  Chief Complaint Leg Swelling; Shortness of Breath; and Anemia   HPI [name] is a 63 y.o. female with a history of CHF with EF of 40 to 45%, AICD, CAD, hypertension, OSA on CPAP, diabetes who presents for evaluation of shortness of breath and leg swelling.  Patient recently discharged from the hospital 10 days ago after being admitted for pneumonia.  She reports that since that she arrived to the emergency room for this last admission until now she has had 36 pound weight gain.  She has had progressively and current severe swelling of both lower extremities.  She is complaining of mild shortness of breath at rest which becomes severe with minimal ambulation, orthopnea.  She denies chest pain.  She reports that she has recovered from her pneumonia.  No longer having any cough or fever.  She does hear some intermittent wheezing/rattling in her lungs.  She is currently on torsemide 20 mg.  Today her doctor told her to increase to 40 mg daily. But she was then sent here for evaluation of anemia.   Past Medical History:  Diagnosis Date  . Acute myocardial infarction, subendocardial infarction, initial episode of care ([place]) 07/21/2012  . Acute osteomyelitis involving ankle and foot ([place]) 02/06/2015  . Acute systolic heart failure ([place]) 07/21/2012   New onset 07/19/12; admission to [facility] ED. Elevated Troponins.  S/p 2D-echo with EF 20-25%.  S/p cardiac catheterization with stenting LAD.  Repeat 2D-echo 10/2011 with improved EF of 35%.   . Anemia   . Automatic implantable cardioverter-defibrillator in situ    a. MDT CRT-D 06/2014, SN: [id number]    . CAD (coronary artery disease)    a. cardiac cath 101/04/2012: PCI/DES to chronically occluded mLAD, consideration  PCI to diag branch in 4 weeks.   . Cataract   . Chicken pox   . Chronic systolic CHF (congestive heart failure) (HCC)    a. mixed ICM & NICM; b. EF 20-25% by echo 07/2012, mid-dist 2/3 of LV sev HK/AK, mild MR. echo 10/2012: EF 30-35%, sev HK ant-septal & inf walls, GR1DD, mild MR, PASP 33. c. echo 02/2013: EF 30%, GR1DD, mild MR. echo 04/2014: EF 30%, Septal-lat dyssynchrony, global HK, inf AK, GR1DD, mild MR. d. echo 10/2014: EF50-55%, WM nl, GR1DD, septal mild paradox. e. echo 02/2015: EF 50-55%, wm   . Depression   . Heart attack ([place])   . Heel ulcer ([place]) 04/27/2015  . History of blood transfusion ~ 2011   "plasma; had neuropathy; couldn't walk"  . Hypertension   . LBBB (left bundle branch block)   . Neuromuscular disorder ([place])   . Neuropathy 2011  . Obesity, unspecified   . OSA on CPAP    Moderate with AHI 23/hr and now on CPAP at 16cm H2O.  Her DME is [facility]  . Pure hypercholesterolemia   . Type II diabetes mellitus ([place])   . Unspecified vitamin D deficiency     Patient Active Problem List   Diagnosis Date Noted  . HCAP (healthcare-associated pneumonia) 10/12/2018  . Sepsis ([place]) 09/28/2018  . History of amputation of lesser toe of right foot ([place]) 04/19/2018  . 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 Paraparesis of both lower limbs ([place]) 03/29/2018  . 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 Paroxysmal atrial fibrillation ([place]) 03/29/2018  .  Class 3 obesity due to excess calories with serious comorbidity and body mass index (BMI) of 40.0 to 44.9 in adult 09/02/2016  . History of CVA (cerebrovascular accident) 11/11/2015  . Chronic renal insufficiency 08/04/2015  . OSA (obstructive sleep apnea) 08/15/2014  . Morbid obesity ([place]) 10/26/2013  . Diarrhea, unspecified 04/29/2013  . Essential hypertension, benign 04/29/2013  . Pure hypercholesterolemia 12/07/2012  . Iron deficiency anemia 12/07/2012  . Diabetic peripheral neuropathy associated with type  2 diabetes mellitus ([place]) 12/07/2012  . Chronic systolic congestive heart failure ([place]) 07/29/2012  . Left bundle-branch block 07/25/2012  . Atherosclerotic heart disease of native coronary artery without angina pectoris 07/25/2012    Past Surgical History:  Procedure Laterality Date  . AMPUTATION TOE Right 06/18/2015   Procedure: AMPUTATION TOE;  Surgeon: Samara Deist, DPM;  Location: [facility] ORS;  Service: Podiatry;  Laterality: Right;  . AMPUTATION TOE Left 10/08/2018   Procedure: AMPUTATION TOE LEFT 2ND;  Surgeon: Albertine Patricia, DPM;  Location: [facility] ORS;  Service: Podiatry;  Laterality: Left;  . BACK SURGERY    . BI-VENTRICULAR IMPLANTABLE CARDIOVERTER DEFIBRILLATOR N/A 07/06/2014   Procedure: BI-VENTRICULAR IMPLANTABLE CARDIOVERTER DEFIBRILLATOR  (CRT-D);  Surgeon: Deboraha Sprang, MD;  Location: [facility] CATH LAB;  Service: Cardiovascular;  Laterality: N/A;  . BI-VENTRICULAR IMPLANTABLE CARDIOVERTER DEFIBRILLATOR  (CRT-D)  07/06/2014  . BILATERAL OOPHORECTOMY  01/2011   ovarian cyst benign  . COLONOSCOPY WITH PROPOFOL Left 02/22/2015   Procedure: COLONOSCOPY WITH PROPOFOL;  Surgeon: Hulen Luster, MD;  Location: [facility] ENDOSCOPY;  Service: Endoscopy;  Laterality: Left;  . CORONARY ANGIOPLASTY WITH STENT PLACEMENT Left 07/2012   new onset systolic CHF; elevated troponins.  Cardiac catheterization with stenting to LAD; EF 15%.  2D-echo: EF 20-25%.  . ESOPHAGOGASTRODUODENOSCOPY N/A 02/22/2015   Procedure: ESOPHAGOGASTRODUODENOSCOPY (EGD);  Surgeon: Hulen Luster, MD;  Location: [facility] ENDOSCOPY;  Service: Endoscopy;  Laterality: N/A;  . INCISION AND DRAINAGE ABSCESS Right 2007   groin; with ICU stay due to sepsis.  Marland Kitchen LAPAROSCOPIC CHOLECYSTECTOMY  2011  . LEFT HEART CATH AND CORONARY ANGIOGRAPHY N/A 10/05/2018   Procedure: LEFT HEART CATH AND CORONARY ANGIOGRAPHY;  Surgeon: Minna Merritts, MD;  Location: [place] CV LAB;  Service: Cardiovascular;  Laterality: N/A;  . LEFT HEART CATHETERIZATION WITH  CORONARY ANGIOGRAM N/A 07/21/2012   Procedure: LEFT HEART CATHETERIZATION WITH CORONARY ANGIOGRAM;  Surgeon: Jolaine Artist, MD;  Location: [facility] CATH LAB;  Service: Cardiovascular;  Laterality: N/A;  . [place]   L4-5  . PERCUTANEOUS CORONARY STENT INTERVENTION (PCI-S) N/A 07/23/2012   Procedure: PERCUTANEOUS CORONARY STENT INTERVENTION (PCI-S);  Surgeon: Sherren Mocha, MD;  Location: [facility] CATH LAB;  Service: Cardiovascular;  Laterality: N/A;  . PERIPHERAL VASCULAR BALLOON ANGIOPLASTY Left 10/06/2018   Procedure: PERIPHERAL VASCULAR BALLOON ANGIOPLASTY;  Surgeon: Katha Cabal, MD;  Location: [place] CV LAB;  Service: Cardiovascular;  Laterality: Left;  . PERIPHERAL VASCULAR CATHETERIZATION N/A 02/10/2015   Procedure: Picc Line Insertion;  Surgeon: Katha Cabal, MD;  Location: [place] CV LAB;  Service: Cardiovascular;  Laterality: N/A;  . TEE WITHOUT CARDIOVERSION N/A 10/02/2018   Procedure: TRANSESOPHAGEAL ECHOCARDIOGRAM (TEE);  Surgeon: Wellington Hampshire, MD;  Location: [facility] ORS;  Service: Cardiovascular;  Laterality: N/A;  . [place]  . VAGINAL HYSTERECTOMY  01/2011   Fibroids/DUB.  Ovaries removed. [place].    Prior to Admission medications   Medication Sig Start Date End Date Taking? Authorizing Provider  AMBULATORY NON FORMULARY MEDICATION 1 Units  by Other route once. Medication Name: foot brace 11/15/15   Narda Amber K, DO  aspirin EC 81 MG EC tablet Take 1 tablet (81 mg total) by mouth daily. 10/10/18   Salary, Avel Peace, MD  Calcium Carb-Cholecalciferol (CALCIUM 600 + D PO) Take 1 tablet by mouth 2 (two) times daily.    [provider]  carvedilol (COREG) 12.5 MG tablet Take 1 tablet (12.5 mg total) by mouth 2 (two) times daily with a meal. 10/09/18   Salary, Montell D, MD  ceFAZolin (ANCEF) IVPB Inject 2 g into the vein every 8 (eight) hours. Indication: MSSA bacteremia and toe infection Last Day of Therapy:  11/10/2018 Labs -  Once weekly:  CBC/D and BMP, Labs - Every other week:  ESR and CRP 10/16/18 11/16/18  Loletha Grayer, MD  citalopram (CELEXA) 20 MG tablet Take 1 tablet (20 mg total) by mouth daily. 04/15/18   Wardell Honour, MD  clopidogrel (PLAVIX) 75 MG tablet Take 1 tablet (75 mg total) by mouth daily. 10/10/18   Salary, Holly Bodily D, MD  ferrous sulfate 325 (65 FE) MG tablet Take 325 mg by mouth daily.    [provider]  glimepiride (AMARYL) 4 MG tablet Take 1 tablet (4 mg total) by mouth 2 (two) times daily. 10/22/18   Rutherford Guys, MD  glucose blood (ONE TOUCH ULTRA TEST) test strip Check sugar three times daily dx: DMII uncontrolled insulin dependent with complications 04/02/02   Wardell Honour, MD  HYDROcodone-acetaminophen (NORCO/VICODIN) 5-325 MG tablet Take 1-2 tablets by mouth every 6 (six) hours as needed for severe pain. 10/09/18   Salary, Avel Peace, MD  insulin glargine (LANTUS) 100 UNIT/ML injection Inject 0.16 mLs (16 Units total) into the skin at bedtime. 10/16/18   Wieting, Richard, MD  Insulin Pen Needle (B-D ULTRAFINE III SHORT PEN) 31G X 8 MM MISC USE AS DIRECTED WITH LANTUS; dx: DMII uncontrolled, insulin dependent, with complications 5/00/93   Wardell Honour, MD  lidocaine (LIDODERM) 5 % Place 1 patch onto the skin daily. Remove & Discard patch within 12 hours or as directed by MD 10/09/18   Salary, Avel Peace, MD  losartan (COZAAR) 25 MG tablet Take 1 tablet (25 mg total) by mouth daily. 10/10/18   Salary, Avel Peace, MD  nitroGLYCERIN (NITROSTAT) 0.4 MG SL tablet Place 1 tablet (0.4 mg total) under the tongue every 5 (five) minutes as needed for chest pain. 10/09/18   Minna Merritts, MD  omega-3 acid ethyl esters (LOVAZA) 1 g capsule Take 1 g by mouth 2 (two) times daily.    [provider]  protein supplement shake (PREMIER PROTEIN) LIQD Take 325 mLs (11 oz total) by mouth 2 (two) times daily between meals. 10/09/18   Salary, Holly Bodily D, MD  rosuvastatin (CRESTOR) 40 MG tablet TAKE  1 TABLET BY MOUTH ONCE DAILY 12/16/17   Minna Merritts, MD  sodium chloride flush (NS) 0.9 % SOLN 20 mLs by Intracatheter route every 8 (eight) hours. 10/16/18   Loletha Grayer, MD  torsemide (DEMADEX) 20 MG tablet Take 2 tablets (40 mg) by mouth once daily 10/27/18   Deboraha Sprang, MD    Allergies Patient has no known allergies.  Family History  Problem Relation Age of Onset  . Diabetes Mother   . Hypertension Mother   . Arthritis Mother        knees, lumbar DDD, cervical DDD  . Cancer Father        prostate,skin,lymphoma.  Marland Kitchen  Cancer Brother 15       bladder cancer; non-smoker  . Diabetes Maternal Grandmother   . Heart disease Maternal Grandmother   . COPD Maternal Grandmother   . Diabetes Paternal Grandmother   . Obesity Brother   . Diabetes Son   . Hypertension Son   . Cancer Maternal Grandfather   . Diabetes Paternal Grandfather     Social History Social History   Tobacco Use  . Smoking status: Never Smoker  . Smokeless tobacco: Never Used  Substance Use Topics  . Alcohol use: No    Alcohol/week: 0.0 standard drinks  . Drug use: No    Review of Systems  Constitutional: Negative for fever. + 36lb weight gain Eyes: Negative for visual changes. ENT: Negative for sore throat. Neck: No neck pain  Cardiovascular: Negative for chest pain. + orthopnea Respiratory: + shortness of breath. Gastrointestinal: Negative for abdominal pain, vomiting or diarrhea. Genitourinary: Negative for dysuria. Musculoskeletal: Negative for back pain. + b/l swelling leg  Skin: Negative for rash. Neurological: Negative for headaches, weakness or numbness. Psych: No SI or HI  ____________________________________________   PHYSICAL EXAM:  VITAL SIGNS: ED Triage Vitals  Enc Vitals Group     BP 10/27/18 1511 (!) 152/61     Pulse Rate 10/27/18 1511 61     Resp 10/27/18 1511 16     Temp 10/27/18 1511 (!) 97.5 F (36.4 C)     Temp Source 10/27/18 1511 Oral     SpO2 10/27/18  1511 97 %     Weight 10/27/18 1513 (!) 306 lb 14.1 oz (139.2 kg)     Height 10/27/18 1513 _0  (1.727 m)     Head Circumference --      Peak Flow --      Pain Score 10/27/18 1513 0     Pain Loc --      Pain Edu? --      Excl. in [place]? --     Constitutional: Alert and oriented, morbidly obese.  HEENT:      Head: Normocephalic and atraumatic.         Eyes: Conjunctivae are normal. Sclera is non-icteric.       Mouth/Throat: Mucous membranes are moist.       Neck: Supple with no signs of meningismus. Cardiovascular: Regular rate and rhythm. No murmurs, gallops, or rubs. 2+ symmetrical distal pulses are present in all extremities. Respiratory: Normal respiratory effort. Lungs are clear to auscultation bilaterally with bilateral coarse rhonchi Gastrointestinal: Soft, non tender, and non distended with positive bowel sounds. No rebound or guarding.  Rectal exam shows green stool guaiac negative Musculoskeletal: 3+ pitting edema bilaterally Neurologic: Normal speech and language. Face is symmetric. Moving all extremities. No gross focal neurologic deficits are appreciated. Skin: Skin is warm, dry and intact. No rash noted. Psychiatric: Mood and affect are normal. Speech and behavior are normal.  ____________________________________________   LABS (all labs ordered are listed, but only abnormal results are displayed)  Labs Reviewed  BASIC METABOLIC PANEL - Abnormal; Notable for the following components:      Result Value   Sodium 134 (*)    BUN 45 (*)    Creatinine, Ser 1.62 (*)    Calcium 8.2 (*)    GFR calc non Af Amer 33 (*)    GFR calc Af Amer 38 (*)    All other components within normal limits  CBC - Abnormal; Notable for the following components:   WBC 3.8 (*)  RBC 2.75 (*)    Hemoglobin 7.9 (*)    HCT 25.5 (*)    All other components within normal limits  TROPONIN I - Abnormal; Notable for the following components:   Troponin I 0.08 (*)    All other components within  normal limits  BRAIN NATRIURETIC PEPTIDE - Abnormal; Notable for the following components:   B Natriuretic Peptide 1,546.0 (*)    All other components within normal limits  TROPONIN I - Abnormal; Notable for the following components:   Troponin I 0.08 (*)    All other components within normal limits   ____________________________________________  EKG  ED ECG REPORT I, Rudene Re, the attending physician, personally viewed and interpreted this ECG.  Paced rhythm, rate of 60, prolonged QTC, right axis deviation, no concordant ST elevations.  Unchanged from prior ____________________________________________  RADIOLOGY  I have personally reviewed the images performed during this visit and I agree with the Radiologist's read.   Interpretation by Radiologist:  Dg Chest 2 View  Result Date: 10/27/2018 CLINICAL DATA:  Shortness of breath.  Recent pneumonia. EXAM: CHEST - 2 VIEW COMPARISON:  10/12/2018 FINDINGS: Cardiac pacemaker/defibrillator in stable position. Enlarged cardiac silhouette. Calcific atherosclerotic disease of the aorta. Interval resolution of previously noted right upper lobe airspace consolidation. Persistent right-sided fissural thickening. Osseous structures are without acute abnormality. Soft tissues are grossly normal. IMPRESSION: Interval resolution of the right upper lobe airspace consolidation. Persistent right-sided fissural thickening. Enlarged cardiac silhouette.  No evidence pulmonary edema. Electronically Signed   By: Fidela Salisbury M.D.   On: 10/27/2018 15:42     ____________________________________________   PROCEDURES  Procedure(s) performed: None Procedures Critical Care performed:  None ____________________________________________   INITIAL IMPRESSION / ASSESSMENT AND PLAN / ED COURSE  63 y.o. female with a history of CHF with EF of 40 to 45%, AICD, CAD, hypertension, OSA on CPAP, diabetes who presents for evaluation of shortness of  breath, orthopnea, 36lb weight gain, and leg swelling.   Patient is severely volume overloaded on exam with 3+ pitting edema, bilateral rhonchi.  No new oxygen requirement.  Chest x-ray concerning for cardiomegaly but no overt pulmonary edema.  Patient's kidney function is at baseline.  Patient is anemic with a hemoglobin of 7.9 however that is stable since 10/15/2018.  During that admission patient hemoglobin dropped from 10.4-7.9 which was attributed to aggressive IV hydration and hemodilution.  She has not noted any evidence of bleeding.  Her rectal exam shows green stool guaiac negative.   Plan to diurese with 40 of IV Lasix and ambulate.  If patient remains with no new oxygen requirement will increase her dose of torsemide and send her home with close follow-up with her cardiologist.  Otherwise will anticipate admission.  Clinical Course as of Oct 28 1999  Tue Oct 27, 2018  1958 Patient had great urine output.  Unfortunately we were unable to measured as patient had 2 accidents in the room.  She was able to ambulate with no hypoxia or significant shortness of breath.  She remains with no new oxygen requirement.  Will increase her diuretics and recommended close follow-up with her PCP or cardiologist in 24 hours for repeat vitals and labs. Patient is on iron supplement at home. Recommended continuing iron.  Discussed return precautions for worsening shortness of breath, chest pain, or dizziness.   [CV]    Clinical Course User Index [CV] Alfred Levins [place], MD     As part of my medical decision making, I reviewed the following  data within the [place] notes reviewed and incorporated, Labs reviewed , EKG interpreted , Old EKG reviewed, Old chart reviewed, Radiograph reviewed , Notes from prior ED visits and [place] Controlled Substance Database    Pertinent labs & imaging results that were available during my care of the patient were reviewed by me and considered in my  medical decision making (see chart for details).    ____________________________________________   FINAL CLINICAL IMPRESSION(S) / ED DIAGNOSES  Final diagnoses:  Acute on chronic congestive heart failure, unspecified heart failure type ([place])      NEW MEDICATIONS STARTED DURING THIS VISIT:  ED Discharge Orders    None       Note:  This document was prepared using Dragon voice recognition software and may include unintentional dictation errors.    Alfred Levins, [place], MD 10/27/18 2001

## 2018-10-28 ENCOUNTER — Other Ambulatory Visit: Payer: Self-pay | Admitting: Family

## 2018-10-28 ENCOUNTER — Ambulatory Visit
Admission: RE | Admit: 2018-10-28 | Discharge: 2018-10-28 | Disposition: A | Discharge status: Home / Self Care | Payer: PPO | Source: Ambulatory Visit | Attending: Family | Admitting: Family

## 2018-10-28 ENCOUNTER — Encounter: Payer: Self-pay | Admitting: Family

## 2018-10-28 ENCOUNTER — Ambulatory Visit: Payer: PPO | Admitting: Family

## 2018-10-28 VITALS — BP 130/62 | HR 66 | Resp 18 | Ht 68.0 in | Wt 302.4 lb

## 2018-10-28 DIAGNOSIS — I2510 Atherosclerotic heart disease of native coronary artery without angina pectoris: Secondary | ICD-10-CM

## 2018-10-28 DIAGNOSIS — I5023 Acute on chronic systolic (congestive) heart failure: Secondary | ICD-10-CM | POA: Insufficient documentation

## 2018-10-28 DIAGNOSIS — Z79810 Long term (current) use of selective estrogen receptor modulators (SERMs): Secondary | ICD-10-CM

## 2018-10-28 DIAGNOSIS — G4733 Obstructive sleep apnea (adult) (pediatric): Secondary | ICD-10-CM

## 2018-10-28 DIAGNOSIS — F329 Major depressive disorder, single episode, unspecified: Secondary | ICD-10-CM

## 2018-10-28 DIAGNOSIS — E7800 Pure hypercholesterolemia, unspecified: Secondary | ICD-10-CM

## 2018-10-28 DIAGNOSIS — Z794 Long term (current) use of insulin: Secondary | ICD-10-CM

## 2018-10-28 DIAGNOSIS — I252 Old myocardial infarction: Secondary | ICD-10-CM | POA: Insufficient documentation

## 2018-10-28 DIAGNOSIS — I10 Essential (primary) hypertension: Secondary | ICD-10-CM

## 2018-10-28 DIAGNOSIS — I447 Left bundle-branch block, unspecified: Secondary | ICD-10-CM

## 2018-10-28 DIAGNOSIS — Z8249 Family history of ischemic heart disease and other diseases of the circulatory system: Secondary | ICD-10-CM | POA: Insufficient documentation

## 2018-10-28 DIAGNOSIS — E1142 Type 2 diabetes mellitus with diabetic polyneuropathy: Secondary | ICD-10-CM

## 2018-10-28 DIAGNOSIS — Z7902 Long term (current) use of antithrombotics/antiplatelets: Secondary | ICD-10-CM

## 2018-10-28 DIAGNOSIS — E1122 Type 2 diabetes mellitus with diabetic chronic kidney disease: Secondary | ICD-10-CM

## 2018-10-28 DIAGNOSIS — I890 Lymphedema, not elsewhere classified: Secondary | ICD-10-CM

## 2018-10-28 DIAGNOSIS — Z89421 Acquired absence of other right toe(s): Secondary | ICD-10-CM

## 2018-10-28 DIAGNOSIS — Z79899 Other long term (current) drug therapy: Secondary | ICD-10-CM

## 2018-10-28 DIAGNOSIS — Z6841 Body Mass Index (BMI) 40.0 and over, adult: Secondary | ICD-10-CM | POA: Insufficient documentation

## 2018-10-28 DIAGNOSIS — Z955 Presence of coronary angioplasty implant and graft: Secondary | ICD-10-CM

## 2018-10-28 DIAGNOSIS — Z89422 Acquired absence of other left toe(s): Secondary | ICD-10-CM

## 2018-10-28 DIAGNOSIS — E669 Obesity, unspecified: Secondary | ICD-10-CM

## 2018-10-28 DIAGNOSIS — Z95810 Presence of automatic (implantable) cardiac defibrillator: Secondary | ICD-10-CM | POA: Insufficient documentation

## 2018-10-28 DIAGNOSIS — E559 Vitamin D deficiency, unspecified: Secondary | ICD-10-CM | POA: Insufficient documentation

## 2018-10-28 DIAGNOSIS — N189 Chronic kidney disease, unspecified: Secondary | ICD-10-CM

## 2018-10-28 DIAGNOSIS — Z833 Family history of diabetes mellitus: Secondary | ICD-10-CM

## 2018-10-28 DIAGNOSIS — I5022 Chronic systolic (congestive) heart failure: Secondary | ICD-10-CM | POA: Insufficient documentation

## 2018-10-28 DIAGNOSIS — Z7982 Long term (current) use of aspirin: Secondary | ICD-10-CM

## 2018-10-28 DIAGNOSIS — I130 Hypertensive heart and chronic kidney disease with heart failure and stage 1 through stage 4 chronic kidney disease, or unspecified chronic kidney disease: Secondary | ICD-10-CM | POA: Insufficient documentation

## 2018-10-28 DIAGNOSIS — D649 Anemia, unspecified: Secondary | ICD-10-CM | POA: Insufficient documentation

## 2018-10-28 DIAGNOSIS — I5043 Acute on chronic combined systolic (congestive) and diastolic (congestive) heart failure: Secondary | ICD-10-CM | POA: Insufficient documentation

## 2018-10-28 LAB — CUP PACEART INCLINIC DEVICE CHECK
Battery Remaining Longevity: 34 mo
Battery Voltage: 2.95 V
Brady Statistic AP VP Percent: 38.85 %
Brady Statistic AS VS Percent: 0.67 %
Brady Statistic RA Percent Paced: 38.52 %
Brady Statistic RV Percent Paced: 97.93 %
Date Time Interrogation Session: 20200121154400
HighPow Impedance: 46 Ohm
Implantable Lead Implant Date: 20150930
Implantable Lead Implant Date: 20150930
Implantable Lead Location: 753857
Implantable Lead Location: 753859
Implantable Lead Location: 753860
Implantable Lead Model: 4398
Implantable Lead Model: 5076
Implantable Pulse Generator Implant Date: 20150930
Lead Channel Impedance Value: 266 Ohm
Lead Channel Impedance Value: 266 Ohm
Lead Channel Impedance Value: 266 Ohm
Lead Channel Impedance Value: 323 Ohm
Lead Channel Impedance Value: 323 Ohm
Lead Channel Impedance Value: 342 Ohm
Lead Channel Impedance Value: 342 Ohm
Lead Channel Impedance Value: 380 Ohm
Lead Channel Impedance Value: 456 Ohm
Lead Channel Impedance Value: 494 Ohm
Lead Channel Impedance Value: 513 Ohm
Lead Channel Impedance Value: 532 Ohm
Lead Channel Pacing Threshold Amplitude: 0.75 V
Lead Channel Pacing Threshold Amplitude: 0.75 V
Lead Channel Pacing Threshold Amplitude: 1 V
Lead Channel Pacing Threshold Pulse Width: 0.4 ms
Lead Channel Pacing Threshold Pulse Width: 0.4 ms
Lead Channel Pacing Threshold Pulse Width: 0.4 ms
Lead Channel Sensing Intrinsic Amplitude: 1 mV
Lead Channel Sensing Intrinsic Amplitude: 22.75 mV
Lead Channel Setting Pacing Amplitude: 2 V
Lead Channel Setting Pacing Amplitude: 2 V
Lead Channel Setting Pacing Pulse Width: 0.4 ms
Lead Channel Setting Sensing Sensitivity: 0.3 mV
MDC IDC LEAD IMPLANT DT: 20150930
MDC IDC MSMT LEADCHNL LV IMPEDANCE VALUE: 513 Ohm
MDC IDC SET LEADCHNL RA PACING AMPLITUDE: 1.5 V
MDC IDC SET LEADCHNL RV PACING PULSEWIDTH: 0.4 ms
MDC IDC STAT BRADY AP VS PERCENT: 0.02 %
MDC IDC STAT BRADY AS VP PERCENT: 60.45 %

## 2018-10-28 LAB — BASIC METABOLIC PANEL
Anion gap: 8 (ref 5–15)
BUN: 44 mg/dL — ABNORMAL HIGH (ref 8–23)
CHLORIDE: 100 mmol/L (ref 98–111)
CO2: 26 mmol/L (ref 22–32)
Calcium: 8.1 mg/dL — ABNORMAL LOW (ref 8.9–10.3)
Creatinine, Ser: 1.54 mg/dL — ABNORMAL HIGH (ref 0.44–1.00)
GFR calc Af Amer: 41 mL/min — ABNORMAL LOW (ref >60)
GFR calc non Af Amer: 35 mL/min — ABNORMAL LOW (ref >60)
Glucose, Bld: 274 mg/dL — ABNORMAL HIGH (ref 70–99)
Potassium: 4.7 mmol/L (ref 3.5–5.1)
Sodium: 134 mmol/L — ABNORMAL LOW (ref 135–145)

## 2018-10-28 LAB — BRAIN NATRIURETIC PEPTIDE: B Natriuretic Peptide: 1719 pg/mL — ABNORMAL HIGH (ref 0.0–100.0)

## 2018-10-28 MED ORDER — POTASSIUM CHLORIDE CRYS ER 20 MEQ PO TBCR
40.0000 meq | EXTENDED_RELEASE_TABLET | Freq: Once | ORAL | Status: AC
  Administered 2018-10-28: 40 meq via ORAL

## 2018-10-28 MED ORDER — FUROSEMIDE 10 MG/ML IJ SOLN
80.0000 mg | Freq: Once | INTRAMUSCULAR | Status: AC
  Administered 2018-10-28: 80 mg via INTRAVENOUS

## 2018-10-28 MED ORDER — HEPARIN SOD (PORK) LOCK FLUSH 100 UNIT/ML IV SOLN: 250.0000 [IU] | INTRAVENOUS | Status: DC | PRN

## 2018-10-28 NOTE — Progress Notes (Signed)
[name] ID: [name], female    DOB: 28-Mar-1956, 63 y.o.   MRN: [id number]  HPI  [name] is a 63 y/o female with a history of CAD, DM, hyperlipidemia, HTN, CKD, anemia, vitamin D deficiency, obstructive sleep apnea, osteomyelitis, depression and chronic heart failure.   Echo report from 10/02/18 reviewed and showed an EF of 45-50% along with mild MR and trivial AR but without vegetation.   Cardiac catheterization done 10/05/18 showed:   Medical management was recommended.   Prox Cx to Dist Cx lesion is 80% stenosed.  Prox LAD-1 lesion is 40% stenosed.  Prox LAD-2 lesion is 90% stenosed.  Prox LAD to Mid LAD lesion is 80% stenosed.  Mid LAD to Dist LAD lesion is 70% stenosed.  1st Diag lesion is 90% stenosed.  Ost 1st Diag lesion is 80% stenosed.  Mid RCA-1 lesion is 40% stenosed.  Mid RCA-2 lesion is 40% stenosed.  Was in [facility] ED 10/27/2018 due to leg swelling, shortness of breath and anemia. Reported 36 pound weight gain since recent hospital discharge. Given IV lasix with good urine output and [name] was released. Admitted 10/12/2018 due to pneumonia. Palliative care and wound consults obtained. Treated with antibiotics. Given IV fluids due to acute kidney injury.Recent MSSA bacteremia on 6 weeks of IV Ancef. Discharged after 4 days.   [name] presents today for [name] initial visit with a chief complaint of moderate shortness of breath with little exertion. [name] says that this has been present for many months but has worsened over Vickie last few days. [name] has associated fatigue, cough, wheezing, pedal edema, palpitations, abdominal distention and difficulty sleeping along with this. [name] denies any dizziness, chest pain or weight gain. [name] was in [facility] ED yesterday and was given 42m IV lasix and has lost 5 pounds but remains quite symptomatic. Hemoglobin has gotten low with Vickie most recent result of 7.9 obtained 10/27/2018. In Vickie past, [name]'s been running in Vickie 10's- 11's.   Past  Medical History:  Diagnosis Date  . Acute myocardial infarction, subendocardial infarction, [place] episode of care ([place] 07/21/2012  . Acute osteomyelitis involving ankle and foot ([place] 02/06/2015  . Acute systolic heart failure ([place] 07/21/2012   [facility]12[facility] Elevated Troponins.  S/p 2D-echo with EF 20-25%.  S/p cardiac catheterization with stenting LAD.  Repeat 2D-echo 10/2011 with improved EF of 35%.   . Anemia   . Automatic implantable cardioverter-defibrillator in situ    a. MDT CRT-D 06/2014, SN:: [id number]    . CAD (coronary artery disease)    a. cardiac cath 101/04/2012: PCI/DES to chronically occluded mLAD, consideration PCI to diag branch in 4 weeks.   . Cataract   . Chicken pox   . Chronic kidney disease   . Chronic systolic CHF (congestive heart failure) (HCC)    a. mixed ICM & NICM; b. EF 20-25% by echo 07/2012, mid-dist 2/3 of LV sev HK/AK, mild MR. echo 10/2012: EF 30-35%, sev HK ant-septal & inf walls, GR1DD, mild MR, PASP 33. c. echo 02/2013: EF 30%, GR1DD, mild MR. echo 04/2014: EF 30%, Septal-lat dyssynchrony, global HK, inf AK, GR1DD, mild MR. d. echo 10/2014: EF50-55%, WM nl, GR1DD, septal mild paradox. e. echo 02/2015: EF 50-55%, wm   . Depression   . Heart attack ([place]   . Heel ulcer ([place] 04/27/2015  . History of blood transfusion ~ 2011   "plasma; had neuropathy; couldn't walk"  . Hypertension   . LBBB (left  bundle branch block)   . Neuromuscular disorder ([place])   . Neuropathy 2011  . Obesity, unspecified   . OSA on CPAP    Moderate with AHI 23/hr and now on CPAP at 16cm H2O.  [name] DME is [facility]  . Pure hypercholesterolemia   . Type II diabetes mellitus ([place])   . Unspecified vitamin D deficiency    Past Surgical History:  Procedure Laterality Date  . AMPUTATION TOE Right 06/18/2015   Procedure: AMPUTATION TOE;  Surgeon: Samara Deist, DPM;  Location: [facility] ORS;  Service: Podiatry;  Laterality: Right;  . AMPUTATION TOE Left 10/08/2018    Procedure: AMPUTATION TOE LEFT 2ND;  Surgeon: Albertine Patricia, DPM;  Location: [facility] ORS;  Service: Podiatry;  Laterality: Left;  . BACK SURGERY    . BI-VENTRICULAR IMPLANTABLE CARDIOVERTER DEFIBRILLATOR N/A 07/06/2014   Procedure: BI-VENTRICULAR IMPLANTABLE CARDIOVERTER DEFIBRILLATOR  (CRT-D);  Surgeon: Deboraha Sprang, MD;  Location: [facility] CATH LAB;  Service: Cardiovascular;  Laterality: N/A;  . BI-VENTRICULAR IMPLANTABLE CARDIOVERTER DEFIBRILLATOR  (CRT-D)  07/06/2014  . BILATERAL OOPHORECTOMY  01/2011   ovarian cyst benign  . COLONOSCOPY WITH PROPOFOL Left 02/22/2015   Procedure: COLONOSCOPY WITH PROPOFOL;  Surgeon: Hulen Luster, MD;  Location: [facility] ENDOSCOPY;  Service: Endoscopy;  Laterality: Left;  . CORONARY ANGIOPLASTY WITH STENT PLACEMENT Left 07/2012   new onset systolic CHF; elevated troponins.  Cardiac catheterization with stenting to LAD; EF 15%.  2D-echo: EF 20-25%.  . ESOPHAGOGASTRODUODENOSCOPY N/A 02/22/2015   Procedure: ESOPHAGOGASTRODUODENOSCOPY (EGD);  Surgeon: Hulen Luster, MD;  Location: [facility] ENDOSCOPY;  Service: Endoscopy;  Laterality: N/A;  . INCISION AND DRAINAGE ABSCESS Right 2007   groin; with ICU stay due to sepsis.  Marland Kitchen LAPAROSCOPIC CHOLECYSTECTOMY  2011  . LEFT HEART CATH AND CORONARY ANGIOGRAPHY N/A 10/05/2018   Procedure: LEFT HEART CATH AND CORONARY ANGIOGRAPHY;  Surgeon: Minna Merritts, MD;  Location: [place] CV LAB;  Service: Cardiovascular;  Laterality: N/A;  . LEFT HEART CATHETERIZATION WITH CORONARY ANGIOGRAM N/A 07/21/2012   Procedure: LEFT HEART CATHETERIZATION WITH CORONARY ANGIOGRAM;  Surgeon: Jolaine Artist, MD;  Location[facility] CATH LAB;  Service: Cardiovascular;  Laterality: N/A;  . [place]   L4-5  . PERCUTANEOUS CORONARY STENT INTERVENTION (PCI-S) N/A 07/23/2012   Procedure: PERCUTANEOUS CORONARY STENT INTERVENTION (PCI-S);  Surgeon: Sherren Mocha, MD;  Location: [facility] CATH LAB;  Service: Cardiovascular;  Laterality: N/A;  . PERIPHERAL  VASCULAR BALLOON ANGIOPLASTY Left 10/06/2018   Procedure: PERIPHERAL VASCULAR BALLOON ANGIOPLASTY;  Surgeon: Katha Cabal, MD;  Location: [place] CV LAB;  Service: Cardiovascular;  Laterality: Left;  . PERIPHERAL VASCULAR CATHETERIZATION N/A 02/10/2015   Procedure: Picc Line Insertion;  Surgeon: Katha Cabal, MD;  Location: [place] CV LAB;  Service: Cardiovascular;  Laterality: N/A;  . TEE WITHOUT CARDIOVERSION N/A 10/02/2018   Procedure: TRANSESOPHAGEAL ECHOCARDIOGRAM (TEE);  Surgeon: Wellington Hampshire, MD;  Location: [facility] ORS;  Service: Cardiovascular;  Laterality: N/A;  . [place]  . VAGINAL HYSTERECTOMY  01/2011   Fibroids/DUB.  Ovaries removed. [place].   Family History  Problem Relation Age of Onset  . Diabetes Mother   . Hypertension Mother   . Arthritis Mother        knees, lumbar DDD, cervical DDD  . Cancer Father        prostate,skin,lymphoma.  . Cancer Brother 73       bladder cancer; non-smoker  . Diabetes Maternal Grandmother   . Heart disease Maternal Grandmother   .  COPD Maternal Grandmother   . Diabetes Paternal Grandmother   . Obesity Brother   . Diabetes Son   . Hypertension Son   . Cancer Maternal Grandfather   . Diabetes Paternal Grandfather    Social History   Tobacco Use  . Smoking status: Never Smoker  . Smokeless tobacco: Never Used  Substance Use Topics  . Alcohol use: No    Alcohol/week: 0.0 standard drinks   No Known Allergies Prior to Admission medications   Medication Sig Start Date End Date Taking? Authorizing Provider  AMBULATORY NON FORMULARY MEDICATION 1 Units by Other route once. Medication Name: foot brace 11/15/15  Yes Patel, Donika K, DO  aspirin EC 81 MG EC tablet Take 1 tablet (81 mg total) by mouth daily. 10/10/18  Yes Salary, Avel Peace, MD  Calcium Carb-Cholecalciferol (CALCIUM 600 + D PO) Take 1 tablet by mouth 2 (two) times daily.   Yes [provider]  carvedilol (COREG) 12.5 MG tablet Take  1 tablet (12.5 mg total) by mouth 2 (two) times daily with a meal. 10/09/18  Yes Salary, Montell D, MD  ceFAZolin (ANCEF) IVPB Inject 2 g into Vickie vein every 8 (eight) hours. Indication: MSSA bacteremia and toe infection Last Day of Therapy:  11/10/2018 Labs - Once weekly:  CBC/D and BMP, Labs - Every other week:  ESR and CRP 10/16/18 11/16/18 Yes Wieting, Richard, MD  citalopram (CELEXA) 20 MG tablet Take 1 tablet (20 mg total) by mouth daily. 04/15/18  Yes Wardell Honour, MD  clopidogrel (PLAVIX) 75 MG tablet Take 1 tablet (75 mg total) by mouth daily. 10/10/18  Yes Salary, Montell D, MD  ferrous sulfate 325 (65 FE) MG tablet Take 325 mg by mouth daily.   Yes [provider]  glimepiride (AMARYL) 4 MG tablet Take 1 tablet (4 mg total) by mouth 2 (two) times daily. 10/22/18  Yes Rutherford Guys, MD  glucose blood (ONE TOUCH ULTRA TEST) test strip Check sugar three times daily dx: DMII uncontrolled insulin dependent with complications 01/12/67  Yes Wardell Honour, MD  HYDROcodone-acetaminophen (NORCO/VICODIN) 5-325 MG tablet Take 1-2 tablets by mouth every 6 (six) hours as needed for severe pain. 10/09/18  Yes Salary, Holly Bodily D, MD  insulin glargine (LANTUS) 100 UNIT/ML injection Inject 0.16 mLs (16 Units total) into Vickie skin at bedtime. 10/16/18  Yes Wieting, Richard, MD  Insulin Pen Needle (B-D ULTRAFINE III SHORT PEN) 31G X 8 MM MISC USE AS DIRECTED WITH LANTUS; dx: DMII uncontrolled, insulin dependent, with complications 0/88/11  Yes Wardell Honour, MD  lidocaine (LIDODERM) 5 % Place 1 patch onto Vickie skin daily. Remove & Discard patch within 12 hours or as directed by MD 10/09/18  Yes Salary, Avel Peace, MD  losartan (COZAAR) 25 MG tablet Take 1 tablet (25 mg total) by mouth daily. 10/10/18  Yes Salary, Avel Peace, MD  nitroGLYCERIN (NITROSTAT) 0.4 MG SL tablet Place 1 tablet (0.4 mg total) under Vickie tongue every 5 (five) minutes as needed for chest pain. 10/09/18  Yes Gollan, Kathlene November, MD  omega-3 acid  ethyl esters (LOVAZA) 1 g capsule Take 1 g by mouth 2 (two) times daily.   Yes [provider]  protein supplement shake (PREMIER PROTEIN) LIQD Take 325 mLs (11 oz total) by mouth 2 (two) times daily between meals. 10/09/18  Yes Salary, Montell D, MD  rosuvastatin (CRESTOR) 40 MG tablet TAKE 1 TABLET BY MOUTH ONCE DAILY 12/16/17  Yes Gollan, Kathlene November, MD  sodium  chloride flush (NS) 0.9 % SOLN 20 mLs by Intracatheter route every 8 (eight) hours. 10/16/18  Yes Loletha Grayer, MD  torsemide (DEMADEX) 20 MG tablet Take 2 tablets (40 mg) by mouth once daily [name] taking differently: Take 40 mg by mouth 2 (two) times daily. Take 2 tablets (40 mg) by mouth once daily 10/27/18  Yes Deboraha Sprang, MD    Review of Systems  Constitutional: Positive for fatigue (easily). Negative for appetite change.  HENT: Positive for congestion. Negative for rhinorrhea and sore throat.   Eyes: Negative.   Respiratory: Positive for cough, shortness of breath (easily) and wheezing.   Cardiovascular: Positive for palpitations (on occasion) and leg swelling. Negative for chest pain.  Gastrointestinal: Positive for abdominal distention. Negative for abdominal pain.  Endocrine: Negative.   Genitourinary: Negative.   Musculoskeletal: Positive for back pain. Negative for neck pain.  Skin: Positive for wound (left 2nd toe amputated).  Allergic/Immunologic: Negative.   Neurological: Negative for dizziness and light-headedness.  Hematological: Negative for adenopathy. Does not bruise/bleed easily.  Psychiatric/Behavioral: Positive for sleep disturbance (sleeping on 1 pillow with oxygen @ 2L). Negative for dysphoric mood. [name] is not nervous/anxious.    Vitals:   10/28/18 1213  BP: 130/62  Pulse: 66  Resp: 18  SpO2: 95%  Weight: (!) 302 lb 6 oz (137.2 kg)  Height: _0  (1.727 m)   Wt Readings from Last 3 Encounters:  10/28/18 (!) 302 lb 6 oz (137.2 kg)  10/27/18 (!) 306 lb 14.1 oz (139.2 kg)   10/27/18 (!) 307 lb (139.3 kg)   Lab Results  Component Value Date   CREATININE 1.62 (H) 10/27/2018   CREATININE 1.60 (H) 10/22/2018   CREATININE 1.52 (H) 10/16/2018    Physical Exam Vitals signs and nursing note reviewed.  Constitutional:      Appearance: [name] is well-developed.  HENT:     Head: Normocephalic and atraumatic.  Neck:     Musculoskeletal: Normal range of motion and neck supple.  Cardiovascular:     Rate and Rhythm: Normal rate and regular rhythm.  Pulmonary:     Effort: Pulmonary effort is normal. No respiratory distress.     Breath sounds: Examination of Vickie right-upper field reveals rhonchi. Examination of Vickie left-upper field reveals rhonchi. Examination of Vickie right-lower field reveals rhonchi and rales. Examination of Vickie left-lower field reveals rhonchi and rales. Rhonchi and rales present.  Abdominal:     General: There is distension.     Palpations: Abdomen is soft.  Musculoskeletal:     Right lower leg: [name] exhibits no tenderness. Edema (3+ pitting) present.     Left lower leg: [name] exhibits no tenderness. Edema (3+ pitting) present.  Skin:    General: Skin is warm and dry.     Comments: [name] says that stitches are still present from left toe amputation  Neurological:     General: No focal deficit present.     Mental Status: [name] is alert and oriented to person, place, and time.  Psychiatric:        Mood and Affect: Mood normal.        Behavior: Behavior normal.    Assessment & Plan:  1: Acute on Chronic heart failure with mildly reduced ejection fraction- - NYHA class III - weighing daily and [name] was instructed to call for an overnight weight gain of >2 pounds or a weekly weight gain of >5 pounds - weight down almost 5 pounds from yesterday but remains quite symptomatic -  [name] sent to same day surgery for 68m IV lasix/ 453m PO potassium - BMP/ BNP to be drawn today as well - doesn't add salt to [name] food (except tomatoes) but admits that  [name] often eats salty foods. When [name] daughter does Vickie cooking, [name] will fix macaroni and cheese, fried weenies etc and [name] is aware that those foods are higher in sodium Written dietary information was given to [name] about this - saw EP (KCaryl Comes1/21/2020 & had torsemide dose increased to 4035m BNP 10/27/2018 was 1546.0 - [name] says that [name]'s received [name] flu vaccine for this season - wears oxygen @ 2L at bedtime - discussed how [name] anemia can contribute to [name] symptoms of fatigue and shortness of breath  2: HTN- - BP looks good today - saw PCP (SaPamella Pert/16/2020 - BMP from 10/27/2018 reviewed and showed sodium 134, potassium 4.7, creatinine 1.62 and GFR 33  3: DM- - A1c 10/22/2018 was 7.8% - glucose yesterday earlier today was 126  4: Obstructive sleep apnea- - hasn't been wearing [name] CPAP recently - explained how untreated sleep apnea can stress Vickie heard potentially contributing to fluid retention  5: Lymphedema- - stage 2 - does elevate [name] legs but says that [name] edema continues - has tried wearing support socks but says that they cut into [name] leg behind [name] knee causing pain - currently unable to exercise due to symptoms - consider lymphapress compression boots if symptoms persists  Medication list was reviewed.  Return tomorrow for a re-evaluation of symptoms.

## 2018-10-28 NOTE — Addendum Note (Signed)
Addended by: Alba Destine on: 10/28/2018 08:28 AM   Modules accepted: Orders

## 2018-10-28 NOTE — Patient Instructions (Signed)
Continue weighing daily and call for an overnight weight gain of > 2 pounds or a weekly weight gain of >5 pounds. 

## 2018-10-28 NOTE — Progress Notes (Signed)
Patient PICC line was accessed for Lasix administration per T. Hackney.  Patient was flushed with 10 cc's of saline following lasix injection.  Patient states she does not use Heparin at home for her antibiotic therapy Patient reports "they said they cant use heparin on me". Patient has antibiotic dose due in the AM.  Instructed to report any problems with PICC line.

## 2018-10-29 ENCOUNTER — Other Ambulatory Visit: Payer: Self-pay | Admitting: *Deleted

## 2018-10-29 ENCOUNTER — Ambulatory Visit: Payer: PPO | Admitting: Family

## 2018-10-29 ENCOUNTER — Other Ambulatory Visit: Payer: Self-pay | Admitting: Family

## 2018-10-29 ENCOUNTER — Telehealth: Payer: Self-pay | Admitting: Licensed Clinical Social Worker

## 2018-10-29 ENCOUNTER — Encounter: Payer: Self-pay | Admitting: Family

## 2018-10-29 ENCOUNTER — Ambulatory Visit
Admission: RE | Admit: 2018-10-29 | Discharge: 2018-10-29 | Disposition: A | Discharge status: Home / Self Care | Payer: PPO | Source: Ambulatory Visit | Attending: Family | Admitting: Family

## 2018-10-29 VITALS — BP 128/66 | HR 68 | Resp 18 | Ht 68.0 in | Wt 299.4 lb

## 2018-10-29 DIAGNOSIS — Z89421 Acquired absence of other right toe(s): Secondary | ICD-10-CM

## 2018-10-29 DIAGNOSIS — Z833 Family history of diabetes mellitus: Secondary | ICD-10-CM | POA: Insufficient documentation

## 2018-10-29 DIAGNOSIS — Z79899 Other long term (current) drug therapy: Secondary | ICD-10-CM

## 2018-10-29 DIAGNOSIS — D649 Anemia, unspecified: Secondary | ICD-10-CM | POA: Insufficient documentation

## 2018-10-29 DIAGNOSIS — I5023 Acute on chronic systolic (congestive) heart failure: Secondary | ICD-10-CM | POA: Insufficient documentation

## 2018-10-29 DIAGNOSIS — F329 Major depressive disorder, single episode, unspecified: Secondary | ICD-10-CM

## 2018-10-29 DIAGNOSIS — I252 Old myocardial infarction: Secondary | ICD-10-CM

## 2018-10-29 DIAGNOSIS — I130 Hypertensive heart and chronic kidney disease with heart failure and stage 1 through stage 4 chronic kidney disease, or unspecified chronic kidney disease: Secondary | ICD-10-CM

## 2018-10-29 DIAGNOSIS — Z955 Presence of coronary angioplasty implant and graft: Secondary | ICD-10-CM

## 2018-10-29 DIAGNOSIS — E1122 Type 2 diabetes mellitus with diabetic chronic kidney disease: Secondary | ICD-10-CM | POA: Insufficient documentation

## 2018-10-29 DIAGNOSIS — Z8249 Family history of ischemic heart disease and other diseases of the circulatory system: Secondary | ICD-10-CM | POA: Insufficient documentation

## 2018-10-29 DIAGNOSIS — Z7902 Long term (current) use of antithrombotics/antiplatelets: Secondary | ICD-10-CM

## 2018-10-29 DIAGNOSIS — Z6841 Body Mass Index (BMI) 40.0 and over, adult: Secondary | ICD-10-CM

## 2018-10-29 DIAGNOSIS — Z95810 Presence of automatic (implantable) cardiac defibrillator: Secondary | ICD-10-CM

## 2018-10-29 DIAGNOSIS — I447 Left bundle-branch block, unspecified: Secondary | ICD-10-CM

## 2018-10-29 DIAGNOSIS — I10 Essential (primary) hypertension: Secondary | ICD-10-CM

## 2018-10-29 DIAGNOSIS — E559 Vitamin D deficiency, unspecified: Secondary | ICD-10-CM | POA: Insufficient documentation

## 2018-10-29 DIAGNOSIS — E7800 Pure hypercholesterolemia, unspecified: Secondary | ICD-10-CM

## 2018-10-29 DIAGNOSIS — G4733 Obstructive sleep apnea (adult) (pediatric): Secondary | ICD-10-CM

## 2018-10-29 DIAGNOSIS — Z7982 Long term (current) use of aspirin: Secondary | ICD-10-CM

## 2018-10-29 DIAGNOSIS — Z794 Long term (current) use of insulin: Secondary | ICD-10-CM

## 2018-10-29 DIAGNOSIS — N189 Chronic kidney disease, unspecified: Secondary | ICD-10-CM

## 2018-10-29 DIAGNOSIS — E669 Obesity, unspecified: Secondary | ICD-10-CM

## 2018-10-29 DIAGNOSIS — I890 Lymphedema, not elsewhere classified: Secondary | ICD-10-CM

## 2018-10-29 DIAGNOSIS — I5022 Chronic systolic (congestive) heart failure: Secondary | ICD-10-CM

## 2018-10-29 DIAGNOSIS — I2510 Atherosclerotic heart disease of native coronary artery without angina pectoris: Secondary | ICD-10-CM

## 2018-10-29 DIAGNOSIS — E1142 Type 2 diabetes mellitus with diabetic polyneuropathy: Secondary | ICD-10-CM

## 2018-10-29 LAB — BASIC METABOLIC PANEL
Anion gap: 8 (ref 5–15)
BUN: 39 mg/dL — ABNORMAL HIGH (ref 8–23)
CO2: 29 mmol/L (ref 22–32)
Calcium: 8.1 mg/dL — ABNORMAL LOW (ref 8.9–10.3)
Chloride: 102 mmol/L (ref 98–111)
Creatinine, Ser: 1.53 mg/dL — ABNORMAL HIGH (ref 0.44–1.00)
GFR calc Af Amer: 41 mL/min — ABNORMAL LOW (ref >60)
GFR calc non Af Amer: 36 mL/min — ABNORMAL LOW (ref >60)
Glucose, Bld: 100 mg/dL — ABNORMAL HIGH (ref 70–99)
Potassium: 4.7 mmol/L (ref 3.5–5.1)
SODIUM: 139 mmol/L (ref 135–145)

## 2018-10-29 LAB — BRAIN NATRIURETIC PEPTIDE: B NATRIURETIC PEPTIDE 5: 1936 pg/mL — AB (ref 0.0–100.0)

## 2018-10-29 MED ORDER — POTASSIUM CHLORIDE CRYS ER 20 MEQ PO TBCR
EXTENDED_RELEASE_TABLET | ORAL | Status: AC
  Administered 2018-10-29: 40 meq via ORAL
  Filled 2018-10-29: qty 2

## 2018-10-29 MED ORDER — POTASSIUM CHLORIDE CRYS ER 20 MEQ PO TBCR
40.0000 meq | EXTENDED_RELEASE_TABLET | Freq: Once | ORAL | Status: AC
  Administered 2018-10-29: 40 meq via ORAL

## 2018-10-29 MED ORDER — SODIUM CHLORIDE FLUSH 0.9 % IV SOLN
INTRAVENOUS | Status: AC
  Administered 2018-10-29: 10 mL via INTRAVENOUS
  Filled 2018-10-29: qty 10

## 2018-10-29 MED ORDER — FUROSEMIDE 10 MG/ML IJ SOLN
INTRAMUSCULAR | Status: AC
  Administered 2018-10-29: 80 mg via INTRAVENOUS
  Filled 2018-10-29: qty 8

## 2018-10-29 MED ORDER — FUROSEMIDE 10 MG/ML IJ SOLN
80.0000 mg | Freq: Once | INTRAMUSCULAR | Status: AC
  Administered 2018-10-29: 80 mg via INTRAVENOUS

## 2018-10-29 MED ORDER — SODIUM CHLORIDE 0.9% FLUSH
10.0000 mL | Freq: Once | INTRAVENOUS | Status: AC
  Administered 2018-10-29: 10 mL via INTRAVENOUS

## 2018-10-29 NOTE — Patient Outreach (Signed)
High Risk HTA Referral for [facility] screening. Call today was unsuccessful, however, I was able to leave a message and requested a return call.  Pt has been engaged with our pharmacy team. I will ask Joetta Manners Pharm D. To mention my outreach if she is able to talk with pt.   If I do not hear from pt I will call her again on Monday 11/02/2018.  Vickie Pont. [name], MSN, [facility]ontological Nurse Practitioner [facility] Care Management [phone]

## 2018-10-29 NOTE — Patient Instructions (Signed)
Continue weighing daily and call for an overnight weight gain of > 2 pounds or a weekly weight gain of >5 pounds. 

## 2018-10-29 NOTE — Telephone Encounter (Signed)
Pt went to ED yesterday for the same and they did labs and Hb was 7.9. she was not transfused- will have to follow up with PCP and nephrology

## 2018-10-29 NOTE — Progress Notes (Signed)
Patient ID: [name], female    DOB: [birth year], 63 y.o.   MRN: [id number]  HPI  [name] is a 63 y/o female with a history of CAD, DM, hyperlipidemia, HTN, CKD, anemia, vitamin D deficiency, obstructive sleep apnea, osteomyelitis, depression and chronic heart failure.   Echo report from 10/02/18 reviewed and showed an EF of 45-50% along with mild MR and trivial AR but without vegetation.   Cardiac catheterization done 10/05/18 showed:   Medical management was recommended.   Prox Cx to Dist Cx lesion is 80% stenosed.  Prox LAD-1 lesion is 40% stenosed.  Prox LAD-2 lesion is 90% stenosed.  Prox LAD to Mid LAD lesion is 80% stenosed.  Mid LAD to Dist LAD lesion is 70% stenosed.  1st Diag lesion is 90% stenosed.  Ost 1st Diag lesion is 80% stenosed.  Mid RCA-1 lesion is 40% stenosed.  Mid RCA-2 lesion is 40% stenosed.  Was in the ED 10/27/2018 due to leg swelling, shortness of breath and anemia. Reported 36 pound weight gain since recent hospital discharge. Given IV lasix with good urine output and she was released. Admitted 10/12/2018 due to pneumonia. Palliative care and wound consults obtained. Treated with antibiotics. Given IV fluids due to acute kidney injury.Recent MSSA bacteremia on 6 weeks of IV Ancef. Discharged after 4 days.   She presents today for a follow-up visit with a chief complaint of moderate fatigue upon minimal exertion. She describes this as chronic in nature having been present for several months. She has associated cough, shortness of breath, wheezing, pedal edema, abdominal distention and difficulty sleeping along with this. She denies any dizziness, palpitations, chest pain or weight gain. She says that her breathing and edema have improved "a lot" since getting IV lasix yesterday. She received 41m IV lasix/ 433m potassium yesterday.   Past Medical History:  Diagnosis Date  . Acute myocardial infarction, subendocardial infarction, initial episode  of care (HCT[place]10/15/2013  . Acute osteomyelitis involving ankle and foot (HCNapanoch5/11/2014  . Acute systolic heart failure ([place]10/15/2013   New [facility]19/12; [facility] Elevated Troponins.  S/p 2D-echo with EF 20-25%.  S/p cardiac catheterization with stenting LAD.  Repeat 2D-echo 10/2011 with improved EF of 35%.   . Anemia   . Automatic implantable cardioverter-defibrillator in situ    a. MDT CRT-D 06/2014, SN: : [id number]    . CAD (coronary artery disease)    a. cardiac cath 101/04/2012: PCI/DES to chronically occluded mLAD, consideration PCI to diag branch in 4 weeks.   . Cataract   . Chicken pox   . Chronic kidney disease   . Chronic systolic CHF (congestive heart failure) (HCC)    a. mixed ICM & NICM; b. EF 20-25% by echo 07/2012, mid-dist 2/3 of LV sev HK/AK, mild MR. echo 10/2012: EF 30-35%, sev HK ant-septal & inf walls, GR1DD, mild MR, PASP 33. c. echo 02/2013: EF 30%, GR1DD, mild MR. echo 04/2014: EF 30%, Septal-lat dyssynchrony, global HK, inf AK, GR1DD, mild MR. d. echo 10/2014: EF50-55%, WM nl, GR1DD, septal mild paradox. e. echo 02/2015: EF 50-55%, wm   . Depression   . Heart attack (HCAvon  . Heel ulcer (HCHighfill7/21/2016  . History of blood transfusion ~ 2011   "plasma; had neuropathy; couldn't walk"  . Hypertension   . LBBB (left bundle branch block)   . Neuromuscular disorder (HCLock[place]  . Neuropathy 2011  . Obesity, unspecified   . OSA on CPAP  Moderate with AHI 23/hr and now on CPAP at 16cm H2O.  Her DME is [facility]  . Pure hypercholesterolemia   . Type II diabetes mellitus ([place])   . Unspecified vitamin D deficiency    Past Surgical History:  Procedure Laterality Date  . AMPUTATION TOE Right 06/18/2015   Procedure: AMPUTATION TOE;  Surgeon: Samara Deist, DPM;  Location: [facility] ORS;  Service: Podiatry;  Laterality: Right;  . AMPUTATION TOE Left 10/08/2018   Procedure: AMPUTATION TOE LEFT 2ND;  Surgeon: Albertine Patricia, DPM;  Location: [facility] ORS;  Service: Podiatry;   Laterality: Left;  . BACK SURGERY    . BI-VENTRICULAR IMPLANTABLE CARDIOVERTER DEFIBRILLATOR N/A 07/06/2014   Procedure: BI-VENTRICULAR IMPLANTABLE CARDIOVERTER DEFIBRILLATOR  (CRT-D);  Surgeon: Deboraha Sprang, MD;  Location: [facility] CATH LAB;  Service: Cardiovascular;  Laterality: N/A;  . BI-VENTRICULAR IMPLANTABLE CARDIOVERTER DEFIBRILLATOR  (CRT-D)  07/06/2014  . BILATERAL OOPHORECTOMY  01/2011   ovarian cyst benign  . COLONOSCOPY WITH PROPOFOL Left 02/22/2015   Procedure: COLONOSCOPY WITH PROPOFOL;  Surgeon: Hulen Luster, MD;  Location[facility] ENDOSCOPY;  Service: Endoscopy;  Laterality: Left;  . CORONARY ANGIOPLASTY WITH STENT PLACEMENT Left 07/2012   new onset systolic CHF; elevated troponins.  Cardiac catheterization with stenting to LAD; EF 15%.  2D-echo: EF 20-25%.  . ESOPHAGOGASTRODUODENOSCOPY N/A 02/22/2015   Procedure: ESOPHAGOGASTRODUODENOSCOPY (EGD);  Surgeon: Hulen Luster, MD;  Location: [facility] ENDOSCOPY;  Service: Endoscopy;  Laterality: N/A;  . INCISION AND DRAINAGE ABSCESS Right 2007   groin; with ICU stay due to sepsis.  Marland Kitchen LAPAROSCOPIC CHOLECYSTECTOMY  2011  . LEFT HEART CATH AND CORONARY ANGIOGRAPHY N/A 10/05/2018   Procedure: LEFT HEART CATH AND CORONARY ANGIOGRAPHY;  Surgeon: Minna Merritts, MD;  Location: [place] CV LAB;  Service: Cardiovascular;  Laterality: N/A;  . LEFT HEART CATHETERIZATION WITH CORONARY ANGIOGRAM N/A 07/21/2012   Procedure: LEFT HEART CATHETERIZATION WITH CORONARY ANGIOGRAM;  Surgeon: Jolaine Artist, MD;  Location[facility] CATH LAB;  Service: Cardiovascular;  Laterality: N/A;  . [place]   L4-5  . PERCUTANEOUS CORONARY STENT INTERVENTION (PCI-S) N/A 07/23/2012   Procedure: PERCUTANEOUS CORONARY STENT INTERVENTION (PCI-S);  Surgeon: Sherren Mocha, MD;  Location: [facility] CATH LAB;  Service: Cardiovascular;  Laterality: N/A;  . PERIPHERAL VASCULAR BALLOON ANGIOPLASTY Left 10/06/2018   Procedure: PERIPHERAL VASCULAR BALLOON ANGIOPLASTY;  Surgeon:  Katha Cabal, MD;  Location: [place] CV LAB;  Service: Cardiovascular;  Laterality: Left;  . PERIPHERAL VASCULAR CATHETERIZATION N/A 02/10/2015   Procedure: Picc Line Insertion;  Surgeon: Katha Cabal, MD;  Location: [place] CV LAB;  Service: Cardiovascular;  Laterality: N/A;  . TEE WITHOUT CARDIOVERSION N/A 10/02/2018   Procedure: TRANSESOPHAGEAL ECHOCARDIOGRAM (TEE);  Surgeon: Wellington Hampshire, MD;  Location: [facility] ORS;  Service: Cardiovascular;  Laterality: N/A;  . [place]  . VAGINAL HYSTERECTOMY  01/2011   Fibroids/DUB.  Ovaries removed. [place].   Family History  Problem Relation Age of Onset  . Diabetes Mother   . Hypertension Mother   . Arthritis Mother        knees, lumbar DDD, cervical DDD  . Cancer Father        prostate,skin,lymphoma.  . Cancer Brother 5       bladder cancer; non-smoker  . Diabetes Maternal Grandmother   . Heart disease Maternal Grandmother   . COPD Maternal Grandmother   . Diabetes Paternal Grandmother   . Obesity Brother   . Diabetes Son   . Hypertension Son   .  Cancer Maternal Grandfather   . Diabetes Paternal Grandfather    Social History   Tobacco Use  . Smoking status: Never Smoker  . Smokeless tobacco: Never Used  Substance Use Topics  . Alcohol use: No    Alcohol/week: 0.0 standard drinks   No Known Allergies  Prior to Admission medications   Medication Sig Start Date End Date Taking? Authorizing Provider  AMBULATORY NON FORMULARY MEDICATION 1 Units by Other route once. Medication Name: foot brace 11/15/15  Yes Patel, Donika K, DO  aspirin EC 81 MG EC tablet Take 1 tablet (81 mg total) by mouth daily. 10/10/18  Yes Salary, Avel Peace, MD  Calcium Carb-Cholecalciferol (CALCIUM 600 + D PO) Take 1 tablet by mouth 2 (two) times daily.   Yes [provider]  carvedilol (COREG) 12.5 MG tablet Take 1 tablet (12.5 mg total) by mouth 2 (two) times daily with a meal. 10/09/18  Yes Salary, Montell D, MD   ceFAZolin (ANCEF) IVPB Inject 2 g into the vein every 8 (eight) hours. Indication: MSSA bacteremia and toe infection Last Day of Therapy:  11/10/2018 Labs - Once weekly:  CBC/D and BMP, Labs - Every other week:  ESR and CRP 10/16/18 11/16/18 Yes Wieting, Richard, MD  citalopram (CELEXA) 20 MG tablet Take 1 tablet (20 mg total) by mouth daily. 04/15/18  Yes Wardell Honour, MD  clopidogrel (PLAVIX) 75 MG tablet Take 1 tablet (75 mg total) by mouth daily. 10/10/18  Yes Salary, Montell D, MD  ferrous sulfate 325 (65 FE) MG tablet Take 325 mg by mouth daily.   Yes [provider]  glimepiride (AMARYL) 4 MG tablet Take 1 tablet (4 mg total) by mouth 2 (two) times daily. 10/22/18  Yes Rutherford Guys, MD  glucose blood (ONE TOUCH ULTRA TEST) test strip Check sugar three times daily dx: DMII uncontrolled insulin dependent with complications 9/51/88  Yes Wardell Honour, MD  HYDROcodone-acetaminophen (NORCO/VICODIN) 5-325 MG tablet Take 1-2 tablets by mouth every 6 (six) hours as needed for severe pain. 10/09/18  Yes Salary, Holly Bodily D, MD  insulin glargine (LANTUS) 100 UNIT/ML injection Inject 0.16 mLs (16 Units total) into the skin at bedtime. 10/16/18  Yes Wieting, Richard, MD  Insulin Pen Needle (B-D ULTRAFINE III SHORT PEN) 31G X 8 MM MISC USE AS DIRECTED WITH LANTUS; dx: DMII uncontrolled, insulin dependent, with complications 01/21/59  Yes Wardell Honour, MD  lidocaine (LIDODERM) 5 % Place 1 patch onto the skin daily. Remove & Discard patch within 12 hours or as directed by MD 10/09/18  Yes Salary, Avel Peace, MD  losartan (COZAAR) 25 MG tablet Take 1 tablet (25 mg total) by mouth daily. 10/10/18  Yes Salary, Avel Peace, MD  nitroGLYCERIN (NITROSTAT) 0.4 MG SL tablet Place 1 tablet (0.4 mg total) under the tongue every 5 (five) minutes as needed for chest pain. 10/09/18  Yes Gollan, Kathlene November, MD  omega-3 acid ethyl esters (LOVAZA) 1 g capsule Take 1 g by mouth 2 (two) times daily.   Yes [provider]  protein supplement shake (PREMIER PROTEIN) LIQD Take 325 mLs (11 oz total) by mouth 2 (two) times daily between meals. 10/09/18  Yes Salary, Montell D, MD  rosuvastatin (CRESTOR) 40 MG tablet TAKE 1 TABLET BY MOUTH ONCE DAILY 12/16/17  Yes Gollan, Kathlene November, MD  sodium chloride flush (NS) 0.9 % SOLN 20 mLs by Intracatheter route every 8 (eight) hours. 10/16/18  Yes Loletha Grayer, MD  torsemide (DEMADEX) 20 MG  tablet Take 2 tablets (40 mg) by mouth once daily Patient taking differently: Take 40 mg by mouth 2 (two) times daily. Take 2 tablets (40 mg) by mouth once daily 10/27/18  Yes Deboraha Sprang, MD    Review of Systems  Constitutional: Positive for fatigue (easily). Negative for appetite change.  HENT: Positive for congestion. Negative for rhinorrhea and sore throat.   Eyes: Negative.   Respiratory: Positive for cough, shortness of breath ("improving") and wheezing ("improving").   Cardiovascular: Positive for leg swelling ("improving"). Negative for chest pain and palpitations.  Gastrointestinal: Positive for abdominal distention ("better"). Negative for abdominal pain.  Endocrine: Negative.   Genitourinary: Negative.   Musculoskeletal: Positive for back pain. Negative for neck pain.  Skin: Positive for wound (left 2nd toe amputated).  Allergic/Immunologic: Negative.   Neurological: Negative for dizziness and light-headedness.  Hematological: Negative for adenopathy. Does not bruise/bleed easily.  Psychiatric/Behavioral: Positive for sleep disturbance (sleeping on 1 pillow with oxygen @ 2L). Negative for dysphoric mood. The patient is not nervous/anxious.    Vitals:   10/29/18 1345  BP: 128/66  Pulse: 68  Resp: 18  SpO2: 96%  Weight: 299 lb 6 oz (135.8 kg)  Height: 5' 8"  (1.727 m)   Wt Readings from Last 3 Encounters:  10/29/18 299 lb 6 oz (135.8 kg)  10/28/18 (!) 302 lb 6 oz (137.2 kg)  10/27/18 (!) 306 lb 14.1 oz (139.2 kg)   Lab Results  Component Value Date    CREATININE 1.54 (H) 10/28/2018   CREATININE 1.62 (H) 10/27/2018   CREATININE 1.60 (H) 10/22/2018    Physical Exam Vitals signs and nursing note reviewed.  Constitutional:      Appearance: She is well-developed.  HENT:     Head: Normocephalic and atraumatic.  Neck:     Musculoskeletal: Normal range of motion and neck supple.  Cardiovascular:     Rate and Rhythm: Normal rate and regular rhythm.  Pulmonary:     Effort: Pulmonary effort is normal. No respiratory distress.     Breath sounds: Examination of the right-upper field reveals rhonchi. Examination of the left-upper field reveals rhonchi. Examination of the right-lower field reveals rales. Examination of the left-lower field reveals rales. Rhonchi and rales present.  Abdominal:     General: There is distension.     Palpations: Abdomen is soft.  Musculoskeletal:     Right lower leg: She exhibits no tenderness. Edema (3+ pitting although softer) present.     Left lower leg: She exhibits no tenderness. Edema (3+ pitting although softer) present.  Skin:    General: Skin is warm and dry.     Comments: Stitches from left toe amputation were removed yesterday  Neurological:     General: No focal deficit present.     Mental Status: She is alert and oriented to person, place, and time.  Psychiatric:        Mood and Affect: Mood normal.        Behavior: Behavior normal.    Assessment & Plan:  1: Acute on Chronic heart failure with mildly reduced ejection fraction- - NYHA class III - remains moderately fluid overloaded although symptomatically feels better - weighing daily and she was instructed to call for an overnight weight gain of >2 pounds or a weekly weight gain of >5 pounds - weight down 3 pounds from last visit yesterday; total of 8 pounds in 2 days - received 38m IV lasix/ 430m PO potassium yesterday - renal function is stable so  will send her back today for additional 43m IV lasix/ 462m PO potassium - BMP/ BNP to be  drawn - doesn't add salt to her food (except tomatoes) but admits that she often eats salty foods. When her daughter does the cooking, she will fix macaroni and cheese, fried weenies etc and patient is aware that those foods are higher in sodium. - saw EP (KCaryl Comes1/21/2020 & had torsemide dose increased to 4027m BNP 10/28/2018 was 1719.0 - she says that she's received her flu vaccine for this season - wears oxygen @ 2L at bedtime  2: HTN- - BP looks good today - saw PCP (SaPamella Pert/16/2020 - BMP from 10/28/2018 reviewed and showed sodium 134, potassium 4.7, creatinine 1.54 and GFR 35  3: DM- - A1c 10/22/2018 was 7.8% - glucose yesterday earlier today was 97  4: Obstructive sleep apnea- - hasn't been wearing her CPAP recently - explained how untreated sleep apnea can stress the heard potentially contributing to fluid retention  5: Lymphedema- - stage 2 - does elevate her legs but says that her edema continues - has tried wearing support socks but says that they cut into her leg behind her knee causing pain - currently unable to exercise due to symptoms - consider lymphapress compression boots if symptoms persists  Medication list was reviewed.  Return in 4 days or sooner for any questions/problems before then. They are unable to return before 4 days.

## 2018-10-29 NOTE — Telephone Encounter (Signed)
Amy pharmacist with [facility] called about patients labs. RBC 2.3, Hemoglobin 6.7, and HCT 21. Results also faxed to the clinic.

## 2018-10-30 ENCOUNTER — Emergency Department: Payer: PPO

## 2018-10-30 ENCOUNTER — Other Ambulatory Visit: Payer: Self-pay

## 2018-10-30 ENCOUNTER — Telehealth: Payer: Self-pay | Admitting: Family Medicine

## 2018-10-30 ENCOUNTER — Other Ambulatory Visit: Payer: Self-pay | Admitting: *Deleted

## 2018-10-30 ENCOUNTER — Inpatient Hospital Stay
Admission: EM | Admit: 2018-10-30 | Discharge: 2018-11-04 | DRG: 291 | Disposition: A | Discharge status: Home Health | Payer: PPO | Attending: Internal Medicine | Admitting: Internal Medicine

## 2018-10-30 DIAGNOSIS — R0689 Other abnormalities of breathing: Secondary | ICD-10-CM | POA: Diagnosis not present

## 2018-10-30 DIAGNOSIS — I480 Paroxysmal atrial fibrillation: Secondary | ICD-10-CM | POA: Diagnosis present

## 2018-10-30 DIAGNOSIS — E1169 Type 2 diabetes mellitus with other specified complication: Secondary | ICD-10-CM | POA: Diagnosis present

## 2018-10-30 DIAGNOSIS — Z6841 Body Mass Index (BMI) 40.0 and over, adult: Secondary | ICD-10-CM

## 2018-10-30 DIAGNOSIS — Z7902 Long term (current) use of antithrombotics/antiplatelets: Secondary | ICD-10-CM

## 2018-10-30 DIAGNOSIS — G4733 Obstructive sleep apnea (adult) (pediatric): Secondary | ICD-10-CM | POA: Diagnosis present

## 2018-10-30 DIAGNOSIS — E785 Hyperlipidemia, unspecified: Secondary | ICD-10-CM | POA: Diagnosis present

## 2018-10-30 DIAGNOSIS — I252 Old myocardial infarction: Secondary | ICD-10-CM

## 2018-10-30 DIAGNOSIS — Z8673 Personal history of transient ischemic attack (TIA), and cerebral infarction without residual deficits: Secondary | ICD-10-CM

## 2018-10-30 DIAGNOSIS — E1122 Type 2 diabetes mellitus with diabetic chronic kidney disease: Secondary | ICD-10-CM | POA: Diagnosis present

## 2018-10-30 DIAGNOSIS — R000 Tachycardia, unspecified: Secondary | ICD-10-CM | POA: Diagnosis not present

## 2018-10-30 DIAGNOSIS — I5043 Acute on chronic combined systolic (congestive) and diastolic (congestive) heart failure: Secondary | ICD-10-CM | POA: Diagnosis present

## 2018-10-30 DIAGNOSIS — I110 Hypertensive heart disease with heart failure: Secondary | ICD-10-CM | POA: Diagnosis not present

## 2018-10-30 DIAGNOSIS — E119 Type 2 diabetes mellitus without complications: Secondary | ICD-10-CM | POA: Diagnosis not present

## 2018-10-30 DIAGNOSIS — M869 Osteomyelitis, unspecified: Secondary | ICD-10-CM | POA: Diagnosis present

## 2018-10-30 DIAGNOSIS — R0602 Shortness of breath: Secondary | ICD-10-CM | POA: Diagnosis not present

## 2018-10-30 DIAGNOSIS — I10 Essential (primary) hypertension: Secondary | ICD-10-CM | POA: Diagnosis not present

## 2018-10-30 DIAGNOSIS — Z89421 Acquired absence of other right toe(s): Secondary | ICD-10-CM

## 2018-10-30 DIAGNOSIS — I2510 Atherosclerotic heart disease of native coronary artery without angina pectoris: Secondary | ICD-10-CM | POA: Diagnosis present

## 2018-10-30 DIAGNOSIS — F329 Major depressive disorder, single episode, unspecified: Secondary | ICD-10-CM | POA: Diagnosis present

## 2018-10-30 DIAGNOSIS — E1142 Type 2 diabetes mellitus with diabetic polyneuropathy: Secondary | ICD-10-CM | POA: Diagnosis present

## 2018-10-30 DIAGNOSIS — I509 Heart failure, unspecified: Secondary | ICD-10-CM | POA: Diagnosis present

## 2018-10-30 DIAGNOSIS — R062 Wheezing: Secondary | ICD-10-CM | POA: Diagnosis not present

## 2018-10-30 DIAGNOSIS — Z90722 Acquired absence of ovaries, bilateral: Secondary | ICD-10-CM

## 2018-10-30 DIAGNOSIS — R0603 Acute respiratory distress: Secondary | ICD-10-CM | POA: Diagnosis not present

## 2018-10-30 DIAGNOSIS — Z560 Unemployment, unspecified: Secondary | ICD-10-CM

## 2018-10-30 DIAGNOSIS — N189 Chronic kidney disease, unspecified: Secondary | ICD-10-CM | POA: Diagnosis present

## 2018-10-30 DIAGNOSIS — I130 Hypertensive heart and chronic kidney disease with heart failure and stage 1 through stage 4 chronic kidney disease, or unspecified chronic kidney disease: Principal | ICD-10-CM | POA: Diagnosis present

## 2018-10-30 DIAGNOSIS — I5023 Acute on chronic systolic (congestive) heart failure: Secondary | ICD-10-CM

## 2018-10-30 DIAGNOSIS — E7800 Pure hypercholesterolemia, unspecified: Secondary | ICD-10-CM | POA: Diagnosis present

## 2018-10-30 DIAGNOSIS — Z825 Family history of asthma and other chronic lower respiratory diseases: Secondary | ICD-10-CM

## 2018-10-30 DIAGNOSIS — Z90710 Acquired absence of both cervix and uterus: Secondary | ICD-10-CM

## 2018-10-30 DIAGNOSIS — I428 Other cardiomyopathies: Secondary | ICD-10-CM | POA: Diagnosis present

## 2018-10-30 DIAGNOSIS — Z794 Long term (current) use of insulin: Secondary | ICD-10-CM

## 2018-10-30 DIAGNOSIS — Z66 Do not resuscitate: Secondary | ICD-10-CM | POA: Diagnosis present

## 2018-10-30 DIAGNOSIS — D631 Anemia in chronic kidney disease: Secondary | ICD-10-CM | POA: Diagnosis present

## 2018-10-30 DIAGNOSIS — Z95810 Presence of automatic (implantable) cardiac defibrillator: Secondary | ICD-10-CM

## 2018-10-30 DIAGNOSIS — Z79899 Other long term (current) drug therapy: Secondary | ICD-10-CM

## 2018-10-30 DIAGNOSIS — R069 Unspecified abnormalities of breathing: Secondary | ICD-10-CM | POA: Diagnosis not present

## 2018-10-30 DIAGNOSIS — Z7982 Long term (current) use of aspirin: Secondary | ICD-10-CM

## 2018-10-30 DIAGNOSIS — N183 Chronic kidney disease, stage 3 (moderate): Secondary | ICD-10-CM | POA: Diagnosis present

## 2018-10-30 DIAGNOSIS — Z8619 Personal history of other infectious and parasitic diseases: Secondary | ICD-10-CM

## 2018-10-30 DIAGNOSIS — E559 Vitamin D deficiency, unspecified: Secondary | ICD-10-CM | POA: Diagnosis present

## 2018-10-30 DIAGNOSIS — Z8249 Family history of ischemic heart disease and other diseases of the circulatory system: Secondary | ICD-10-CM | POA: Diagnosis not present

## 2018-10-30 DIAGNOSIS — Z9981 Dependence on supplemental oxygen: Secondary | ICD-10-CM

## 2018-10-30 DIAGNOSIS — I5022 Chronic systolic (congestive) heart failure: Secondary | ICD-10-CM

## 2018-10-30 DIAGNOSIS — Z9049 Acquired absence of other specified parts of digestive tract: Secondary | ICD-10-CM

## 2018-10-30 DIAGNOSIS — J9601 Acute respiratory failure with hypoxia: Secondary | ICD-10-CM | POA: Diagnosis not present

## 2018-10-30 DIAGNOSIS — Z833 Family history of diabetes mellitus: Secondary | ICD-10-CM

## 2018-10-30 DIAGNOSIS — Z89422 Acquired absence of other left toe(s): Secondary | ICD-10-CM

## 2018-10-30 DIAGNOSIS — Z955 Presence of coronary angioplasty implant and graft: Secondary | ICD-10-CM

## 2018-10-30 LAB — CBC WITH DIFFERENTIAL/PLATELET
Abs Immature Granulocytes: 0.02 10*3/uL (ref 0.00–0.07)
Basophils Absolute: 0 10*3/uL (ref 0.0–0.1)
Basophils Relative: 0 %
Eosinophils Absolute: 0.1 10*3/uL (ref 0.0–0.5)
Eosinophils Relative: 1 %
HCT: 27.8 % — ABNORMAL LOW (ref 36.0–46.0)
Hemoglobin: 8.4 g/dL — ABNORMAL LOW (ref 12.0–15.0)
Immature Granulocytes: 0 %
Lymphocytes Relative: 20 %
Lymphs Abs: 1.3 10*3/uL (ref 0.7–4.0)
MCH: 28.3 pg (ref 26.0–34.0)
MCHC: 30.2 g/dL (ref 30.0–36.0)
MCV: 93.6 fL (ref 80.0–100.0)
Monocytes Absolute: 0.5 10*3/uL (ref 0.1–1.0)
Monocytes Relative: 8 %
Neutro Abs: 4.7 10*3/uL (ref 1.7–7.7)
Neutrophils Relative %: 71 %
Platelets: 202 10*3/uL (ref 150–400)
RBC: 2.97 MIL/uL — ABNORMAL LOW (ref 3.87–5.11)
RDW: 13.9 % (ref 11.5–15.5)
WBC: 6.7 10*3/uL (ref 4.0–10.5)
nRBC: 0 % (ref 0.0–0.2)

## 2018-10-30 MED ORDER — FUROSEMIDE 10 MG/ML IJ SOLN
40.0000 mg | Freq: Once | INTRAMUSCULAR | Status: AC
  Administered 2018-10-30: 40 mg via INTRAVENOUS
  Filled 2018-10-30: qty 4

## 2018-10-30 NOTE — ED Triage Notes (Signed)
Pt arrives via ems for shob. Per ems was called out ref clotted picc line in right arm, but pt was shob. Pt arrives on 4lpm [place] oxygen with bilateral lower extremity edema from mid thigh to toes approx 3+. Ems gave 3 SL NTG.

## 2018-10-30 NOTE — ED Provider Notes (Signed)
[facility] Emergency Department Provider Note  ____________________________________________   First MD Initiated Contact with Patient 10/30/18 2332     (approximate)  I have reviewed the triage vital signs and the nursing notes.   HISTORY  Chief Complaint Shortness of Breath    HPI [name] is a 63 y.o. female with below list of chronic medical conditions including recently diagnosed sepsis secondary to pneumonia with indwelling PICC line returns to the emergency department via EMS secondary to acute onset of dyspnea progressively worsened over the course of the evening.  Patient also admits to progressively worsening lower extremity edema today as well.  EMS administered 3 sublingual nitroglycerin with dramatic improvement per the patient.  Patient admits to worsening orthopnea tonight as well.   Past Medical History:  Diagnosis Date  . Acute myocardial infarction, subendocardial infarction, initial episode of care ([place]) 07/21/2012  . Acute osteomyelitis involving ankle and foot ([place]) 02/06/2015  . Acute systolic heart failure ([place]) 07/21/2012   New onset 07/19/12; admission to [facility] ED. Elevated Troponins.  S/p 2D-echo with EF 20-25%.  S/p cardiac catheterization with stenting LAD.  Repeat 2D-echo 10/2011 with improved EF of 35%.   . Anemia   . Automatic implantable cardioverter-defibrillator in situ    a. MDT CRT-D 06/2014, SN: [id number]    . CAD (coronary artery disease)    a. cardiac cath 101/04/2012: PCI/DES to chronically occluded mLAD, consideration PCI to diag branch in 4 weeks.   . Cataract   . Chicken pox   . Chronic kidney disease   . Chronic systolic CHF (congestive heart failure) (HCC)    a. mixed ICM & NICM; b. EF 20-25% by echo 07/2012, mid-dist 2/3 of LV sev HK/AK, mild MR. echo 10/2012: EF 30-35%, sev HK ant-septal & inf walls, GR1DD, mild MR, PASP 33. c. echo 02/2013: EF 30%, GR1DD, mild MR. echo 04/2014: EF 30%, Septal-lat  dyssynchrony, global HK, inf AK, GR1DD, mild MR. d. echo 10/2014: EF50-55%, WM nl, GR1DD, septal mild paradox. e. echo 02/2015: EF 50-55%, wm   . Depression   . Heart attack ([place])   . Heel ulcer ([place]) 04/27/2015  . History of blood transfusion ~ 2011   "plasma; had neuropathy; couldn't walk"  . Hypertension   . LBBB (left bundle branch block)   . Neuromuscular disorder ([place])   . Neuropathy 2011  . Obesity, unspecified   . OSA on CPAP    Moderate with AHI 23/hr and now on CPAP at 16cm H2O.  Her DME is [facility]  . Pure hypercholesterolemia   . Type II diabetes mellitus ([place])   . Unspecified vitamin D deficiency     Patient Active Problem List   Diagnosis Date Noted  . Acute on chronic heart failure ([place]) 10/28/2018  . Lymphedema 10/28/2018  . History of amputation of lesser toe of right foot ([place]) 04/19/2018  . 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 Paraparesis of both lower limbs ([place]) 03/29/2018  . 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 Paroxysmal atrial fibrillation ([place]) 03/29/2018  . Class 3 obesity due to excess calories with serious comorbidity and body mass index (BMI) of 40.0 to 44.9 in adult 09/02/2016  . History of CVA (cerebrovascular accident) 11/11/2015  . Chronic renal insufficiency 08/04/2015  . OSA (obstructive sleep apnea) 08/15/2014  . Morbid obesity ([place]) 10/26/2013  . Essential hypertension, benign 04/29/2013  . Pure hypercholesterolemia 12/07/2012  . Iron deficiency anemia 12/07/2012  . Diabetic peripheral neuropathy associated with type  2 diabetes mellitus ([place]) 12/07/2012  . Chronic systolic congestive heart failure ([place]) 07/29/2012  . Left bundle-branch block 07/25/2012  . Atherosclerotic heart disease of native coronary artery without angina pectoris 07/25/2012    Past Surgical History:  Procedure Laterality Date  . AMPUTATION TOE Right 06/18/2015   Procedure: AMPUTATION TOE;  Surgeon: Samara Deist, DPM;  Location: [facility] ORS;   Service: Podiatry;  Laterality: Right;  . AMPUTATION TOE Left 10/08/2018   Procedure: AMPUTATION TOE LEFT 2ND;  Surgeon: Albertine Patricia, DPM;  Location: [facility] ORS;  Service: Podiatry;  Laterality: Left;  . BACK SURGERY    . BI-VENTRICULAR IMPLANTABLE CARDIOVERTER DEFIBRILLATOR N/A 07/06/2014   Procedure: BI-VENTRICULAR IMPLANTABLE CARDIOVERTER DEFIBRILLATOR  (CRT-D);  Surgeon: Deboraha Sprang, MD;  Location: [facility] CATH LAB;  Service: Cardiovascular;  Laterality: N/A;  . BI-VENTRICULAR IMPLANTABLE CARDIOVERTER DEFIBRILLATOR  (CRT-D)  07/06/2014  . BILATERAL OOPHORECTOMY  01/2011   ovarian cyst benign  . COLONOSCOPY WITH PROPOFOL Left 02/22/2015   Procedure: COLONOSCOPY WITH PROPOFOL;  Surgeon: Hulen Luster, MD;  Location: [facility] ENDOSCOPY;  Service: Endoscopy;  Laterality: Left;  . CORONARY ANGIOPLASTY WITH STENT PLACEMENT Left 07/2012   new onset systolic CHF; elevated troponins.  Cardiac catheterization with stenting to LAD; EF 15%.  2D-echo: EF 20-25%.  . ESOPHAGOGASTRODUODENOSCOPY N/A 02/22/2015   Procedure: ESOPHAGOGASTRODUODENOSCOPY (EGD);  Surgeon: Hulen Luster, MD;  Location: [facility] ENDOSCOPY;  Service: Endoscopy;  Laterality: N/A;  . INCISION AND DRAINAGE ABSCESS Right 2007   groin; with ICU stay due to sepsis.  Marland Kitchen LAPAROSCOPIC CHOLECYSTECTOMY  2011  . LEFT HEART CATH AND CORONARY ANGIOGRAPHY N/A 10/05/2018   Procedure: LEFT HEART CATH AND CORONARY ANGIOGRAPHY;  Surgeon: Minna Merritts, MD;  Location: [facility] CV LAB;  Service: Cardiovascular;  Laterality: N/A;  . LEFT HEART CATHETERIZATION WITH CORONARY ANGIOGRAM N/A 07/21/2012   Procedure: LEFT HEART CATHETERIZATION WITH CORONARY ANGIOGRAM;  Surgeon: Jolaine Artist, MD;  Location: [facility] CATH LAB;  Service: Cardiovascular;  Laterality: N/A;  . [place]   L4-5  . PERCUTANEOUS CORONARY STENT INTERVENTION (PCI-S) N/A 07/23/2012   Procedure: PERCUTANEOUS CORONARY STENT INTERVENTION (PCI-S);  Surgeon: Sherren Mocha, MD;   Location: [facility] CATH LAB;  Service: Cardiovascular;  Laterality: N/A;  . PERIPHERAL VASCULAR BALLOON ANGIOPLASTY Left 10/06/2018   Procedure: PERIPHERAL VASCULAR BALLOON ANGIOPLASTY;  Surgeon: Katha Cabal, MD;  Location: [place] CV LAB;  Service: Cardiovascular;  Laterality: Left;  . PERIPHERAL VASCULAR CATHETERIZATION N/A 02/10/2015   Procedure: Picc Line Insertion;  Surgeon: Katha Cabal, MD;  Location: [place] CV LAB;  Service: Cardiovascular;  Laterality: N/A;  . TEE WITHOUT CARDIOVERSION N/A 10/02/2018   Procedure: TRANSESOPHAGEAL ECHOCARDIOGRAM (TEE);  Surgeon: Wellington Hampshire, MD;  Location: [facility] ORS;  Service: Cardiovascular;  Laterality: N/A;  . [place]  . VAGINAL HYSTERECTOMY  01/2011   Fibroids/DUB.  Ovaries removed. [place].    Prior to Admission medications   Medication Sig Start Date End Date Taking? Authorizing Provider  AMBULATORY NON FORMULARY MEDICATION 1 Units by Other route once. Medication Name: foot brace 11/15/15   Narda Amber K, DO  aspirin EC 81 MG EC tablet Take 1 tablet (81 mg total) by mouth daily. 10/10/18   Salary, Avel Peace, MD  Calcium Carb-Cholecalciferol (CALCIUM 600 + D PO) Take 1 tablet by mouth 2 (two) times daily.    [provider]  carvedilol (COREG) 12.5 MG tablet Take 1 tablet (12.5 mg total) by mouth 2 (two) times daily  with a meal. 10/09/18   Salary, Holly Bodily D, MD  ceFAZolin (ANCEF) IVPB Inject 2 g into the vein every 8 (eight) hours. Indication: MSSA bacteremia and toe infection Last Day of Therapy:  11/10/2018 Labs - Once weekly:  CBC/D and BMP, Labs - Every other week:  ESR and CRP 10/16/18 11/16/18  Loletha Grayer, MD  citalopram (CELEXA) 20 MG tablet Take 1 tablet (20 mg total) by mouth daily. 04/15/18   Wardell Honour, MD  clopidogrel (PLAVIX) 75 MG tablet Take 1 tablet (75 mg total) by mouth daily. 10/10/18   Salary, Holly Bodily D, MD  ferrous sulfate 325 (65 FE) MG tablet Take 325 mg by mouth daily.     [provider]  glimepiride (AMARYL) 4 MG tablet Take 1 tablet (4 mg total) by mouth 2 (two) times daily. 10/22/18   Rutherford Guys, MD  glucose blood (ONE TOUCH ULTRA TEST) test strip Check sugar three times daily dx: DMII uncontrolled insulin dependent with complications 06/04/92   Wardell Honour, MD  HYDROcodone-acetaminophen (NORCO/VICODIN) 5-325 MG tablet Take 1-2 tablets by mouth every 6 (six) hours as needed for severe pain. 10/09/18   Salary, Avel Peace, MD  insulin glargine (LANTUS) 100 UNIT/ML injection Inject 0.16 mLs (16 Units total) into the skin at bedtime. 10/16/18   Wieting, Richard, MD  Insulin Pen Needle (B-D ULTRAFINE III SHORT PEN) 31G X 8 MM MISC USE AS DIRECTED WITH LANTUS; dx: DMII uncontrolled, insulin dependent, with complications 04/22/95   Wardell Honour, MD  lidocaine (LIDODERM) 5 % Place 1 patch onto the skin daily. Remove & Discard patch within 12 hours or as directed by MD 10/09/18   Salary, Avel Peace, MD  losartan (COZAAR) 25 MG tablet Take 1 tablet (25 mg total) by mouth daily. 10/10/18   Salary, Avel Peace, MD  nitroGLYCERIN (NITROSTAT) 0.4 MG SL tablet Place 1 tablet (0.4 mg total) under the tongue every 5 (five) minutes as needed for chest pain. 10/09/18   Minna Merritts, MD  omega-3 acid ethyl esters (LOVAZA) 1 g capsule Take 1 g by mouth 2 (two) times daily.    [provider]  protein supplement shake (PREMIER PROTEIN) LIQD Take 325 mLs (11 oz total) by mouth 2 (two) times daily between meals. 10/09/18   Salary, Holly Bodily D, MD  rosuvastatin (CRESTOR) 40 MG tablet TAKE 1 TABLET BY MOUTH ONCE DAILY 12/16/17   Minna Merritts, MD  sodium chloride flush (NS) 0.9 % SOLN 20 mLs by Intracatheter route every 8 (eight) hours. 10/16/18   Loletha Grayer, MD  torsemide (DEMADEX) 20 MG tablet Take 2 tablets (40 mg) by mouth once daily Patient taking differently: Take 40 mg by mouth 2 (two) times daily.  10/27/18   Deboraha Sprang, MD    Allergies Patient has no  known allergies.  Family History  Problem Relation Age of Onset  . Diabetes Mother   . Hypertension Mother   . Arthritis Mother        knees, lumbar DDD, cervical DDD  . Cancer Father        prostate,skin,lymphoma.  . Cancer Brother 25       bladder cancer; non-smoker  . Diabetes Maternal Grandmother   . Heart disease Maternal Grandmother   . COPD Maternal Grandmother   . Diabetes Paternal Grandmother   . Obesity Brother   . Diabetes Son   . Hypertension Son   . Cancer Maternal Grandfather   . Diabetes Paternal Grandfather  Social History Social History   Tobacco Use  . Smoking status: Never Smoker  . Smokeless tobacco: Never Used  Substance Use Topics  . Alcohol use: No    Alcohol/week: 0.0 standard drinks  . Drug use: No    Review of Systems Constitutional: No fever/chills Eyes: No visual changes. ENT: No sore throat. Cardiovascular: Denies chest pain. Respiratory: Positive for shortness of breath. Gastrointestinal: No abdominal pain.  No nausea, no vomiting.  No diarrhea.  No constipation. Genitourinary: Negative for dysuria. Musculoskeletal: Negative for neck pain.  Negative for back pain.  Positive for lower extremity edema Integumentary: Negative for rash. Neurological: Negative for headaches, focal weakness or numbness.   ____________________________________________   PHYSICAL EXAM:  VITAL SIGNS: ED Triage Vitals  Enc Vitals Group     BP 10/30/18 2331 (!) 160/75     Pulse Rate 10/30/18 2328 81     Resp 10/30/18 2328 (!) 24     Temp 10/30/18 2331 97.9 F (36.6 C)     Temp Source 10/30/18 2328 Oral     SpO2 10/30/18 2328 96 %     Weight 10/30/18 2329 132.5 kg (292 lb)     Height 10/30/18 2329 1.727 m (_0 )     Head Circumference --      Peak Flow --      Pain Score 10/30/18 2329 0     Pain Loc --      Pain Edu? --      Excl. in [place]? --     Constitutional: Alert and oriented. Well appearing and in no acute distress. Eyes: Conjunctivae  are normal.  Head: Atraumatic. Mouth/Throat: Mucous membranes are moist.  Oropharynx non-erythematous. Neck: No stridor.   Cardiovascular: Normal rate, regular rhythm. Good peripheral circulation. Grossly normal heart sounds. Respiratory: Tachypnea, bibasilar Rales. Gastrointestinal: Soft and nontender. No distention.  Musculoskeletal: 2+ pitting bilateral lower extremity edema. Neurologic:  Normal speech and language. No gross focal neurologic deficits are appreciated.  Skin:  Skin is warm, dry and intact. No rash noted. Psychiatric: Mood and affect are normal. Speech and behavior are normal.  ____________________________________________   LABS (all labs ordered are listed, but only abnormal results are displayed)  Labs Reviewed  CBC WITH DIFFERENTIAL/PLATELET - Abnormal; Notable for the following components:      Result Value   RBC 2.97 (*)    Hemoglobin 8.4 (*)    HCT 27.8 (*)    All other components within normal limits  BASIC METABOLIC PANEL - Abnormal; Notable for the following components:   Glucose, Bld 174 (*)    BUN 36 (*)    Creatinine, Ser 1.57 (*)    Calcium 8.0 (*)    GFR calc non Af Amer 34 (*)    GFR calc Af Amer 40 (*)    All other components within normal limits  BRAIN NATRIURETIC PEPTIDE - Abnormal; Notable for the following components:   B Natriuretic Peptide 1,949.0 (*)    All other components within normal limits  TROPONIN I - Abnormal; Notable for the following components:   Troponin I 0.03 (*)    All other components within normal limits   ____________________________________________  EKG  ED ECG REPORT I, [place] N Kristien Salatino, the attending physician, personally viewed and interpreted this ECG.   Date: 10/30/2018  EKG Time: 11:27 PM  Rate: 80  Rhythm: Atrial sensed ventricular paced rhythm  Axis: Normal  Intervals: Normal  ST&T Change: None  ____________________________________________  RADIOLOGY I, [place] Ernst Bowler, personally  viewed and  evaluated these images (plain radiographs) as part of my medical decision making, as well as reviewing the written report by the radiologist.  ED MD interpretation: Interstitial thickening suspicious for pulmonary edema probable small pleural effusion per radiologist on chest x-ray.  Ovoid opacity in the right midlung likely loculated fluid in the fissure adjacent patchy opacity in the right upper lobe concerning for recurrent pneumonia per radiologist.  Official radiology report(s): Dg Chest Port 1 View  Result Date: 10/30/2018 CLINICAL DATA:  Dyspnea and shortness of breath. EXAM: PORTABLE CHEST 1 VIEW COMPARISON:  Frontal and lateral views 10/27/2018, 10/12/2018 additional prior exams FINDINGS: Tip of the right upper extremity PICC in the mid SVC. Multi lead left-sided pacemaker remains in place. Mild cardiomegaly which is similar to prior exam. Unchanged mediastinal contours with aortic atherosclerosis. Increased interstitial thickening suspicious for pulmonary edema. Ovoid the right mid lung is likely fluid in the fissure. Faint adjacent opacity in the right upper lobe, worsened from prior exam. Suspect small pleural effusions. No pneumothorax. IMPRESSION: 1. Interstitial thickening suspicious for pulmonary edema, probable small pleural effusions. 2. Ovoid opacity in the right mid lung is likely loculated fluid in the fissure, however nonspecific. Adjacent patchy opacity in the right upper lobe. This may represent atelectasis or recurrent pneumonia given waxing and waning appearance on consecutive radiographs. Electronically Signed   By: Keith Rake M.D.   On: 10/30/2018 23:45      Procedures   ____________________________________________   INITIAL IMPRESSION / ASSESSMENT AND PLAN / ED COURSE  As part of my medical decision making, I reviewed the following data within the electronic MEDICAL RECORD NUMBER   63 year old female presenting with above-stated history and physical exam secondary  to progressive dyspnea and lower extremity edema.  Concern for possible CHF exacerbation with pulmonary edema based on clinical exam.  Chest x-ray revealed evidence of pulmonary edema laboratory data notable for BNP of 1949, BUN/creatinine 36 and 1.57.  Troponin 0 0.03.  Patient given 40 mg of Lasix shortly after initial evaluation.  On reevaluation patient states symptoms improved.  Patient discussed with Dr. Jannifer Franklin for hospital admission further evaluation and management of CHF exacerbation. ____________________________________________  FINAL CLINICAL IMPRESSION(S) / ED DIAGNOSES  Final diagnoses:  None     MEDICATIONS GIVEN DURING THIS VISIT:  Medications  furosemide (LASIX) injection 40 mg (40 mg Intravenous Given 10/30/18 2338)     ED Discharge Orders    None       Note:  This document was prepared using Dragon voice recognition software and may include unintentional dictation errors.    Gregor Hams, MD 10/31/18 [id number]

## 2018-10-30 NOTE — Telephone Encounter (Signed)
Copied from [place] [id number]. Topic: General - Other >> Oct 30, 2018 12:33 PM Judyann Munson wrote: Reason for CRM: denise- [facility] is calling to advise she will be faxing over orders for the patient care please advise

## 2018-10-30 NOTE — Patient Outreach (Signed)
[facility] High Risk Patient Referral and screening outreach. I was able to talk with [name] today. She says she is feeling good today (although she coughs throughout our 15 minute conversation). I note that a referral has been placed with [facility] and Palliative care. Her initial assessment has not been scheduled. I have advised that this would be an excellent service for her to be involved with to help manage her chronic illness sxs. Also advised that if she decides she does not want to take advantage of their service, [facility] could be a back up.  We completed her initial screening. Her main health concerns are her HF and DM. She weighs daily, does not add salt to her food, rinses canned vegetables. She doesn't do the shopping and the person doing shopping does not necessarily pay attention to na content. She knows the HF Action Plan and I have encouraged her to call her provider if she goes into the yellow zone.  She reports her FBSs are <120 but her pm levels are often >160. She was 89 today and had a bedtime reading last night of 100. She did have a snack before bed.  She is still being followed for her foot wound and amputation care.  I will call [place] and Palliative care and ask them to notify me if pt becomes active with them. [facility] will not be necessary if that comes to fruition.  [name]. Vickie Neither, MSN, [facility] Gerontological Nurse Practitioner [facility] Care Management [phone]

## 2018-10-31 DIAGNOSIS — E1142 Type 2 diabetes mellitus with diabetic polyneuropathy: Secondary | ICD-10-CM | POA: Diagnosis present

## 2018-10-31 DIAGNOSIS — R0603 Acute respiratory distress: Secondary | ICD-10-CM | POA: Diagnosis not present

## 2018-10-31 DIAGNOSIS — D631 Anemia in chronic kidney disease: Secondary | ICD-10-CM | POA: Diagnosis present

## 2018-10-31 DIAGNOSIS — I509 Heart failure, unspecified: Secondary | ICD-10-CM | POA: Diagnosis present

## 2018-10-31 DIAGNOSIS — Z8249 Family history of ischemic heart disease and other diseases of the circulatory system: Secondary | ICD-10-CM | POA: Diagnosis not present

## 2018-10-31 DIAGNOSIS — E785 Hyperlipidemia, unspecified: Secondary | ICD-10-CM | POA: Diagnosis present

## 2018-10-31 DIAGNOSIS — Z6841 Body Mass Index (BMI) 40.0 and over, adult: Secondary | ICD-10-CM | POA: Diagnosis not present

## 2018-10-31 DIAGNOSIS — I130 Hypertensive heart and chronic kidney disease with heart failure and stage 1 through stage 4 chronic kidney disease, or unspecified chronic kidney disease: Secondary | ICD-10-CM | POA: Diagnosis present

## 2018-10-31 DIAGNOSIS — I252 Old myocardial infarction: Secondary | ICD-10-CM | POA: Diagnosis not present

## 2018-10-31 DIAGNOSIS — Z955 Presence of coronary angioplasty implant and graft: Secondary | ICD-10-CM | POA: Diagnosis not present

## 2018-10-31 DIAGNOSIS — I5023 Acute on chronic systolic (congestive) heart failure: Secondary | ICD-10-CM

## 2018-10-31 DIAGNOSIS — E559 Vitamin D deficiency, unspecified: Secondary | ICD-10-CM | POA: Diagnosis present

## 2018-10-31 DIAGNOSIS — I2510 Atherosclerotic heart disease of native coronary artery without angina pectoris: Secondary | ICD-10-CM

## 2018-10-31 DIAGNOSIS — I10 Essential (primary) hypertension: Secondary | ICD-10-CM | POA: Diagnosis not present

## 2018-10-31 DIAGNOSIS — G4733 Obstructive sleep apnea (adult) (pediatric): Secondary | ICD-10-CM

## 2018-10-31 DIAGNOSIS — M869 Osteomyelitis, unspecified: Secondary | ICD-10-CM | POA: Diagnosis present

## 2018-10-31 DIAGNOSIS — Z8673 Personal history of transient ischemic attack (TIA), and cerebral infarction without residual deficits: Secondary | ICD-10-CM | POA: Diagnosis not present

## 2018-10-31 DIAGNOSIS — E1169 Type 2 diabetes mellitus with other specified complication: Secondary | ICD-10-CM | POA: Diagnosis present

## 2018-10-31 DIAGNOSIS — F329 Major depressive disorder, single episode, unspecified: Secondary | ICD-10-CM | POA: Diagnosis present

## 2018-10-31 DIAGNOSIS — I480 Paroxysmal atrial fibrillation: Secondary | ICD-10-CM | POA: Diagnosis present

## 2018-10-31 DIAGNOSIS — I5043 Acute on chronic combined systolic (congestive) and diastolic (congestive) heart failure: Secondary | ICD-10-CM | POA: Diagnosis present

## 2018-10-31 DIAGNOSIS — Z89421 Acquired absence of other right toe(s): Secondary | ICD-10-CM | POA: Diagnosis not present

## 2018-10-31 DIAGNOSIS — E119 Type 2 diabetes mellitus without complications: Secondary | ICD-10-CM | POA: Diagnosis not present

## 2018-10-31 DIAGNOSIS — E1122 Type 2 diabetes mellitus with diabetic chronic kidney disease: Secondary | ICD-10-CM | POA: Diagnosis present

## 2018-10-31 DIAGNOSIS — E7800 Pure hypercholesterolemia, unspecified: Secondary | ICD-10-CM | POA: Diagnosis present

## 2018-10-31 DIAGNOSIS — I428 Other cardiomyopathies: Secondary | ICD-10-CM | POA: Diagnosis present

## 2018-10-31 DIAGNOSIS — N183 Chronic kidney disease, stage 3 (moderate): Secondary | ICD-10-CM | POA: Diagnosis present

## 2018-10-31 DIAGNOSIS — Z66 Do not resuscitate: Secondary | ICD-10-CM | POA: Diagnosis present

## 2018-10-31 LAB — BASIC METABOLIC PANEL
Anion gap: 5 (ref 5–15)
Anion gap: 7 (ref 5–15)
BUN: 34 mg/dL — ABNORMAL HIGH (ref 8–23)
BUN: 36 mg/dL — ABNORMAL HIGH (ref 8–23)
CALCIUM: 8 mg/dL — AB (ref 8.9–10.3)
CO2: 30 mmol/L (ref 22–32)
CO2: 33 mmol/L — ABNORMAL HIGH (ref 22–32)
CREATININE: 1.36 mg/dL — AB (ref 0.44–1.00)
Calcium: 8 mg/dL — ABNORMAL LOW (ref 8.9–10.3)
Chloride: 101 mmol/L (ref 98–111)
Chloride: 102 mmol/L (ref 98–111)
Creatinine, Ser: 1.57 mg/dL — ABNORMAL HIGH (ref 0.44–1.00)
GFR calc Af Amer: 40 mL/min — ABNORMAL LOW (ref >60)
GFR calc Af Amer: 48 mL/min — ABNORMAL LOW (ref >60)
GFR calc non Af Amer: 34 mL/min — ABNORMAL LOW (ref >60)
GFR, EST NON AFRICAN AMERICAN: 41 mL/min — AB (ref >60)
GLUCOSE: 174 mg/dL — AB (ref 70–99)
Glucose, Bld: 182 mg/dL — ABNORMAL HIGH (ref 70–99)
Potassium: 4.3 mmol/L (ref 3.5–5.1)
Potassium: 4.9 mmol/L (ref 3.5–5.1)
Sodium: 138 mmol/L (ref 135–145)
Sodium: 140 mmol/L (ref 135–145)

## 2018-10-31 LAB — GLUCOSE, CAPILLARY
GLUCOSE-CAPILLARY: 138 mg/dL — AB (ref 70–99)
GLUCOSE-CAPILLARY: 166 mg/dL — AB (ref 70–99)
Glucose-Capillary: 191 mg/dL — ABNORMAL HIGH (ref 70–99)
Glucose-Capillary: 223 mg/dL — ABNORMAL HIGH (ref 70–99)
Glucose-Capillary: 178 mg/dL — ABNORMAL HIGH (ref 70–99)

## 2018-10-31 LAB — TROPONIN I: Troponin I: 0.03 ng/mL (ref <0.03)

## 2018-10-31 LAB — CBC
HCT: 26.6 % — ABNORMAL LOW (ref 36.0–46.0)
Hemoglobin: 8.1 g/dL — ABNORMAL LOW (ref 12.0–15.0)
MCH: 28.2 pg (ref 26.0–34.0)
MCHC: 30.5 g/dL (ref 30.0–36.0)
MCV: 92.7 fL (ref 80.0–100.0)
PLATELETS: 189 10*3/uL (ref 150–400)
RBC: 2.87 MIL/uL — ABNORMAL LOW (ref 3.87–5.11)
RDW: 13.9 % (ref 11.5–15.5)
WBC: 5.9 10*3/uL (ref 4.0–10.5)
nRBC: 0 % (ref 0.0–0.2)

## 2018-10-31 LAB — BRAIN NATRIURETIC PEPTIDE: B Natriuretic Peptide: 1949 pg/mL — ABNORMAL HIGH (ref 0.0–100.0)

## 2018-10-31 MED ORDER — ONDANSETRON HCL 4 MG/2ML IJ SOLN: 4.0000 mg | Freq: Four times a day (QID) | INTRAMUSCULAR | Status: DC | PRN

## 2018-10-31 MED ORDER — ONDANSETRON HCL 4 MG PO TABS: 4.0000 mg | ORAL_TABLET | Freq: Four times a day (QID) | ORAL | Status: DC | PRN

## 2018-10-31 MED ORDER — ACETAMINOPHEN 325 MG PO TABS: 650.0000 mg | ORAL_TABLET | Freq: Four times a day (QID) | ORAL | Status: DC | PRN

## 2018-10-31 MED ORDER — ACETAMINOPHEN 650 MG RE SUPP: 650.0000 mg | Freq: Four times a day (QID) | RECTAL | Status: DC | PRN

## 2018-10-31 MED ORDER — ASPIRIN EC 81 MG PO TBEC
81.0000 mg | DELAYED_RELEASE_TABLET | Freq: Every day | ORAL | Status: DC
  Administered 2018-10-31 – 2018-11-04 (×5): 81 mg via ORAL
  Filled 2018-10-31 (×5): qty 1

## 2018-10-31 MED ORDER — CARVEDILOL 12.5 MG PO TABS
12.5000 mg | ORAL_TABLET | Freq: Two times a day (BID) | ORAL | Status: DC
  Administered 2018-10-31 – 2018-11-04 (×9): 12.5 mg via ORAL
  Filled 2018-10-31 (×9): qty 1

## 2018-10-31 MED ORDER — SODIUM CHLORIDE 0.9% FLUSH
10.0000 mL | Freq: Two times a day (BID) | INTRAVENOUS | Status: DC
  Administered 2018-10-31 – 2018-11-01 (×4): 10 mL via INTRAVENOUS
  Administered 2018-11-01: 20 mL via INTRAVENOUS
  Administered 2018-11-02 – 2018-11-04 (×4): 10 mL via INTRAVENOUS

## 2018-10-31 MED ORDER — FUROSEMIDE 10 MG/ML IJ SOLN
80.0000 mg | Freq: Two times a day (BID) | INTRAMUSCULAR | Status: DC
  Administered 2018-10-31 – 2018-11-02 (×4): 80 mg via INTRAVENOUS
  Filled 2018-10-31 (×4): qty 8

## 2018-10-31 MED ORDER — HEPARIN SODIUM (PORCINE) 5000 UNIT/ML IJ SOLN
5000.0000 [IU] | Freq: Three times a day (TID) | INTRAMUSCULAR | Status: DC
  Administered 2018-10-31 – 2018-11-04 (×13): 5000 [IU] via SUBCUTANEOUS
  Filled 2018-10-31 (×12): qty 1

## 2018-10-31 MED ORDER — TORSEMIDE 20 MG PO TABS
40.0000 mg | ORAL_TABLET | Freq: Two times a day (BID) | ORAL | Status: DC
  Administered 2018-10-31: 40 mg via ORAL
  Filled 2018-10-31: qty 2

## 2018-10-31 MED ORDER — CEFAZOLIN SODIUM-DEXTROSE 2-4 GM/100ML-% IV SOLN
2.0000 g | Freq: Three times a day (TID) | INTRAVENOUS | Status: DC
  Administered 2018-10-31 – 2018-11-04 (×13): 2 g via INTRAVENOUS
  Filled 2018-10-31 (×18): qty 100

## 2018-10-31 MED ORDER — LOSARTAN POTASSIUM 25 MG PO TABS
25.0000 mg | ORAL_TABLET | Freq: Every day | ORAL | Status: DC
  Administered 2018-10-31 – 2018-11-02 (×3): 25 mg via ORAL
  Filled 2018-10-31 (×3): qty 1

## 2018-10-31 MED ORDER — SODIUM CHLORIDE 0.9% FLUSH
10.0000 mL | Freq: Two times a day (BID) | INTRAVENOUS | Status: DC
  Administered 2018-11-01: 40 mL
  Administered 2018-11-02 – 2018-11-04 (×4): 10 mL

## 2018-10-31 MED ORDER — SODIUM CHLORIDE 0.9 % IV SOLN
INTRAVENOUS | Status: DC | PRN
  Administered 2018-10-31 – 2018-11-03 (×5): 500 mL via INTRAVENOUS

## 2018-10-31 MED ORDER — CLOPIDOGREL BISULFATE 75 MG PO TABS
75.0000 mg | ORAL_TABLET | Freq: Every day | ORAL | Status: DC
  Administered 2018-10-31 – 2018-11-04 (×5): 75 mg via ORAL
  Filled 2018-10-31 (×5): qty 1

## 2018-10-31 MED ORDER — SODIUM CHLORIDE 0.9% FLUSH: 10.0000 mL | INTRAVENOUS | Status: DC | PRN

## 2018-10-31 MED ORDER — ROSUVASTATIN CALCIUM 10 MG PO TABS
40.0000 mg | ORAL_TABLET | Freq: Every day | ORAL | Status: DC
  Administered 2018-10-31 – 2018-11-03 (×4): 40 mg via ORAL
  Filled 2018-10-31 (×4): qty 4

## 2018-10-31 MED ORDER — FUROSEMIDE 10 MG/ML IJ SOLN
40.0000 mg | Freq: Once | INTRAMUSCULAR | Status: AC
  Administered 2018-10-31: 40 mg via INTRAVENOUS
  Filled 2018-10-31: qty 4

## 2018-10-31 MED ORDER — INSULIN ASPART 100 UNIT/ML ~~LOC~~ SOLN
0.0000 [IU] | Freq: Every day | SUBCUTANEOUS | Status: DC
  Administered 2018-11-03: 2 [IU] via SUBCUTANEOUS
  Filled 2018-10-31: qty 1

## 2018-10-31 MED ORDER — CEFAZOLIN IV (FOR PTA / DISCHARGE USE ONLY): 2.0000 g | Freq: Three times a day (TID) | INTRAVENOUS | Status: DC

## 2018-10-31 MED ORDER — INSULIN ASPART 100 UNIT/ML ~~LOC~~ SOLN
0.0000 [IU] | Freq: Three times a day (TID) | SUBCUTANEOUS | Status: DC
  Administered 2018-10-31: 3 [IU] via SUBCUTANEOUS
  Administered 2018-10-31 – 2018-11-01 (×3): 2 [IU] via SUBCUTANEOUS
  Administered 2018-11-01: 3 [IU] via SUBCUTANEOUS
  Administered 2018-11-02: 1 [IU] via SUBCUTANEOUS
  Administered 2018-11-02 (×2): 3 [IU] via SUBCUTANEOUS
  Administered 2018-11-03: 7 [IU] via SUBCUTANEOUS
  Administered 2018-11-03: 2 [IU] via SUBCUTANEOUS
  Administered 2018-11-03: 5 [IU] via SUBCUTANEOUS
  Administered 2018-11-04: 2 [IU] via SUBCUTANEOUS
  Administered 2018-11-04: 5 [IU] via SUBCUTANEOUS
  Filled 2018-10-31 (×13): qty 1

## 2018-10-31 MED ORDER — ORAL CARE MOUTH RINSE
15.0000 mL | Freq: Two times a day (BID) | OROMUCOSAL | Status: DC
  Administered 2018-11-03: 15 mL via OROMUCOSAL

## 2018-10-31 MED ORDER — CITALOPRAM HYDROBROMIDE 20 MG PO TABS
20.0000 mg | ORAL_TABLET | Freq: Every day | ORAL | Status: DC
  Administered 2018-10-31 – 2018-11-04 (×5): 20 mg via ORAL
  Filled 2018-10-31 (×5): qty 1

## 2018-10-31 NOTE — ED Notes (Signed)
Critical troponin of 0.03 called from lab. Dr. Owens Shark notified, no new verbal orders received.

## 2018-10-31 NOTE — Consult Note (Signed)
Cardiology Consultation:   Patient ID: [name] MRN: [id number]; DOB: 1955/10/23  Admit date: 10/30/2018 Date of Consult: 10/31/2018  Primary Care Provider: Rutherford Guys, MD Primary Cardiologist: Dr. Rockey Situ Primary Electrophysiologist:  Dr. Caryl Comes   Patient Profile:   [name] is a 63 y.o. 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 Jannifer Franklin.  History of Present Illness:   [name] reports that her weight was actually decreasing at home, and her breathing had been stable, until yesterday. She noted that she was having difficulty with her PICC line and called her home health nurse. Around the same time, she noted progressive shortness of breath, orthopnea, LE edema, and cough productive of clear phlegm. No fevers/chills. Her grandchildren live with her, and she thought she was just coming down with a cold.   She has had multiple recent admissions, and she was recently in the ER for shortness of breath and required IV lasix. She feels that she has been making good urine this week and is not sure what the trigger is for worsening SOB.   Past Medical History:  Diagnosis Date  . Acute myocardial infarction, subendocardial infarction, initial episode of care ([place]) 07/21/2012  . Acute osteomyelitis involving ankle and foot ([place]) 02/06/2015  . Acute systolic heart failure ([place]) 07/21/2012   New onset 07/19/12; admission to [facility] ED. Elevated Troponins.  S/p 2D-echo with EF 20-25%.  S/p cardiac catheterization with stenting LAD.  Repeat 2D-echo 10/2011 with improved EF of 35%.   . Anemia   . Automatic implantable cardioverter-defibrillator in situ    a. MDT CRT-D 06/2014, SN:  [id number]    . CAD (coronary artery disease)    a. cardiac cath 101/04/2012: PCI/DES to chronically occluded mLAD, consideration PCI to diag branch in 4 weeks.   . Cataract   . Chicken pox   . Chronic kidney disease   . Chronic systolic CHF (congestive heart failure) (HCC)    a. mixed ICM & NICM; b. EF 20-25% by echo 07/2012, mid-dist 2/3 of LV sev HK/AK, mild MR. echo 10/2012: EF 30-35%, sev HK ant-septal & inf walls, GR1DD, mild MR, PASP 33. c. echo 02/2013: EF 30%, GR1DD, mild MR. echo 04/2014: EF 30%, Septal-lat dyssynchrony, global HK, inf AK, GR1DD, mild MR. d. echo 10/2014: EF50-55%, WM nl, GR1DD, septal mild paradox. e. echo 02/2015: EF 50-55%, wm   . Depression   . Heart attack ([place])   . Heel ulcer ([place]) 04/27/2015  . History of blood transfusion ~ 2011   "plasma; had neuropathy; couldn't walk"  . Hypertension   . LBBB (left bundle branch block)   . Neuromuscular disorder ([place])   . Neuropathy 2011  . Obesity, unspecified   . OSA on CPAP    Moderate with AHI 23/hr and now on CPAP at 16cm H2O.  Her DME is [facility]  . Pure hypercholesterolemia   . Type II diabetes mellitus ([place])   . Unspecified vitamin D deficiency     Past Surgical History:  Procedure Laterality Date  . AMPUTATION TOE Right 06/18/2015   Procedure: AMPUTATION TOE;  Surgeon: Samara Deist, DPM;  Location: [facility] ORS;  Service: Podiatry;  Laterality: Right;  . AMPUTATION TOE Left 10/08/2018   Procedure:  AMPUTATION TOE LEFT 2ND;  Surgeon: Albertine Patricia, DPM;  Location: [facility] ORS;  Service: Podiatry;  Laterality: Left;  . BACK SURGERY    . BI-VENTRICULAR IMPLANTABLE CARDIOVERTER DEFIBRILLATOR N/A 07/06/2014   Procedure: BI-VENTRICULAR IMPLANTABLE CARDIOVERTER DEFIBRILLATOR  (CRT-D);  Surgeon: Deboraha Sprang, MD;  Location: [facility] CATH LAB;  Service: Cardiovascular;  Laterality: N/A;  . BI-VENTRICULAR IMPLANTABLE CARDIOVERTER DEFIBRILLATOR  (CRT-D)  07/06/2014  . BILATERAL OOPHORECTOMY  01/2011   ovarian cyst benign  . COLONOSCOPY  WITH PROPOFOL Left 02/22/2015   Procedure: COLONOSCOPY WITH PROPOFOL;  Surgeon: Hulen Luster, MD;  Location: [facility] ENDOSCOPY;  Service: Endoscopy;  Laterality: Left;  . CORONARY ANGIOPLASTY WITH STENT PLACEMENT Left 07/2012   new onset systolic CHF; elevated troponins.  Cardiac catheterization with stenting to LAD; EF 15%.  2D-echo: EF 20-25%.  . ESOPHAGOGASTRODUODENOSCOPY N/A 02/22/2015   Procedure: ESOPHAGOGASTRODUODENOSCOPY (EGD);  Surgeon: Hulen Luster, MD;  Location: [facility] ENDOSCOPY;  Service: Endoscopy;  Laterality: N/A;  . INCISION AND DRAINAGE ABSCESS Right 2007   groin; with ICU stay due to sepsis.  Marland Kitchen LAPAROSCOPIC CHOLECYSTECTOMY  2011  . LEFT HEART CATH AND CORONARY ANGIOGRAPHY N/A 10/05/2018   Procedure: LEFT HEART CATH AND CORONARY ANGIOGRAPHY;  Surgeon: Minna Merritts, MD;  Location: [facility] CV LAB;  Service: Cardiovascular;  Laterality: N/A;  . LEFT HEART CATHETERIZATION WITH CORONARY ANGIOGRAM N/A 07/21/2012   Procedure: LEFT HEART CATHETERIZATION WITH CORONARY ANGIOGRAM;  Surgeon: Jolaine Artist, MD;  Location: [facility] CATH LAB;  Service: Cardiovascular;  Laterality: N/A;  . [place]   L4-5  . PERCUTANEOUS CORONARY STENT INTERVENTION (PCI-S) N/A 07/23/2012   Procedure: PERCUTANEOUS CORONARY STENT INTERVENTION (PCI-S);  Surgeon: Sherren Mocha, MD;  Location: [facility] CATH LAB;  Service: Cardiovascular;  Laterality: N/A;  . PERIPHERAL VASCULAR BALLOON ANGIOPLASTY Left 10/06/2018   Procedure: PERIPHERAL VASCULAR BALLOON ANGIOPLASTY;  Surgeon: Katha Cabal, MD;  Location: [place] CV LAB;  Service: Cardiovascular;  Laterality: Left;  . PERIPHERAL VASCULAR CATHETERIZATION N/A 02/10/2015   Procedure: Picc Line Insertion;  Surgeon: Katha Cabal, MD;  Location: [facility] CV LAB;  Service: Cardiovascular;  Laterality: N/A;  . TEE WITHOUT CARDIOVERSION N/A 10/02/2018   Procedure: TRANSESOPHAGEAL ECHOCARDIOGRAM (TEE);  Surgeon: Wellington Hampshire, MD;  Location:  [facility] ORS;  Service: Cardiovascular;  Laterality: N/A;  . [place]  . VAGINAL HYSTERECTOMY  01/2011   Fibroids/DUB.  Ovaries removed. [place].     Home Medications:  Prior to Admission medications   Medication Sig Start Date End Date Taking? Authorizing Provider  aspirin EC 81 MG EC tablet Take 1 tablet (81 mg total) by mouth daily. 10/10/18  Yes Salary, Avel Peace, MD  Calcium Carb-Cholecalciferol (CALCIUM 600 + D PO) Take 1 tablet by mouth 2 (two) times daily.   Yes [provider]  carvedilol (COREG) 12.5 MG tablet Take 1 tablet (12.5 mg total) by mouth 2 (two) times daily with a meal. 10/09/18  Yes Salary, Montell D, MD  ceFAZolin (ANCEF) IVPB Inject 2 g into the vein every 8 (eight) hours. Indication: MSSA bacteremia and toe infection Last Day of Therapy:  11/10/2018 Labs - Once weekly:  CBC/D and BMP, Labs - Every other week:  ESR and CRP 10/16/18 11/16/18 Yes Wieting, Richard, MD  citalopram (CELEXA) 20 MG tablet Take 1 tablet (20 mg total) by mouth daily. 04/15/18  Yes Wardell Honour, MD  clopidogrel (PLAVIX) 75 MG tablet Take 1 tablet (75 mg total) by mouth daily.  10/10/18  Yes Salary, Montell D, MD  ferrous sulfate 325 (65 FE) MG tablet Take 325 mg by mouth daily.   Yes [provider]  glimepiride (AMARYL) 4 MG tablet Take 1 tablet (4 mg total) by mouth 2 (two) times daily. 10/22/18  Yes Rutherford Guys, MD  insulin glargine (LANTUS) 100 UNIT/ML injection Inject 0.16 mLs (16 Units total) into the skin at bedtime. 10/16/18  Yes Wieting, Richard, MD  losartan (COZAAR) 25 MG tablet Take 1 tablet (25 mg total) by mouth daily. 10/10/18  Yes Salary, Holly Bodily D, MD  omega-3 acid ethyl esters (LOVAZA) 1 g capsule Take 1 g by mouth 2 (two) times daily.   Yes [provider]  rosuvastatin (CRESTOR) 40 MG tablet TAKE 1 TABLET BY MOUTH ONCE DAILY Patient taking differently: Take 40 mg by mouth at bedtime.  12/16/17  Yes Gollan, Kathlene November, MD  sodium chloride flush (NS)  0.9 % SOLN 20 mLs by Intracatheter route every 8 (eight) hours. 10/16/18  Yes Loletha Grayer, MD  torsemide (DEMADEX) 20 MG tablet Take 2 tablets (40 mg) by mouth once daily Patient taking differently: Take 40 mg by mouth 2 (two) times daily.  10/27/18  Yes Deboraha Sprang, MD  AMBULATORY NON FORMULARY MEDICATION 1 Units by Other route once. Medication Name: foot brace 11/15/15   Narda Amber K, DO  glucose blood (ONE TOUCH ULTRA TEST) test strip Check sugar three times daily dx: DMII uncontrolled insulin dependent with complications 5/92/92   Wardell Honour, MD  Insulin Pen Needle (B-D ULTRAFINE III SHORT PEN) 31G X 8 MM MISC USE AS DIRECTED WITH LANTUS; dx: DMII uncontrolled, insulin dependent, with complications 4/46/28   Wardell Honour, MD  nitroGLYCERIN (NITROSTAT) 0.4 MG SL tablet Place 1 tablet (0.4 mg total) under the tongue every 5 (five) minutes as needed for chest pain. 10/09/18   Minna Merritts, MD    Inpatient Medications: Scheduled Meds: . aspirin EC  81 mg Oral Daily  . carvedilol  12.5 mg Oral BID WC  . citalopram  20 mg Oral Daily  . clopidogrel  75 mg Oral Daily  . heparin  5,000 Units Subcutaneous Q8H  . insulin aspart  0-5 Units Subcutaneous QHS  . insulin aspart  0-9 Units Subcutaneous TID WC  . losartan  25 mg Oral Daily  . mouth rinse  15 mL Mouth Rinse BID  . rosuvastatin  40 mg Oral QHS  . sodium chloride flush  10 mL Intravenous Q12H  . sodium chloride flush  10-40 mL Intracatheter Q12H  . torsemide  40 mg Oral BID   Continuous Infusions: . sodium chloride 500 mL (10/31/18 6381)  . ceFAZolin 200 mL/hr at 10/31/18 7711   PRN Meds: sodium chloride, acetaminophen **OR** acetaminophen, ondansetron **OR** ondansetron (ZOFRAN) IV, sodium chloride flush  Allergies:   No Known Allergies  Social History:   Social History   Socioeconomic History  . Marital status: Married    Spouse name: [name]  . Number of children: 2  . Years of education: 68  . Highest  education level: High school graduate  Occupational History  . Occupation: disabled    Fish farm manager: UNEMPLOYED    Comment: 03/2010 for peripheral neuropathy  . 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  Social Needs  . Financial resource strain: Not hard at all  . Food insecurity:    Worry: Never true    Inability: Never true  . Transportation needs:  Medical: No    Non-medical: No  Tobacco Use  . Smoking status: Never Smoker  . Smokeless tobacco: Never Used  Substance and Sexual Activity  . Alcohol use: No    Alcohol/week: 0.0 standard drinks  . Drug use: No  . Sexual activity: Never    Birth control/protection: Post-menopausal, Surgical  Lifestyle  . Physical activity:    Days per week: 0 days    Minutes per session: 0 min  . Stress: Rather much  Relationships  . Social connections:    Talks on phone: More than three times a week    Gets together: More than three times a week    Attends religious service: Never    Active member of club or organization: No    Attends meetings of clubs or organizations: Never    Relationship status: Married  . Intimate partner violence:    Fear of current or ex partner: No    Emotionally abused: No    Physically abused: No    Forced sexual activity: No  Other Topics Concern  . Not on file  Social History Narrative   Marital status:Married x 45 yrs. Happily married, no abuse.       Children:  2 children (daughter 59, son 52). Two grandsons and 2 step grandchildren.  1 gg.      Lives: with husband, daughter, son-in-law, 2 grandsons, and 1 on the way.      Employment: disability for peripheral neuropathy 2012.  Previously had home daycare.      Tobacco: none      Alcohol: none      Drugs: none      Exercise: none. [employer].      Pets: dog.      Always uses seat belts, smoke detectors in home.      No guns in the home.       Caffeine use: 2 cups coffee per day.       Nutrition: Well balanced diet.    Family  History:    Family History  Problem Relation Age of Onset  . Diabetes Mother   . Hypertension Mother   . Arthritis Mother        knees, lumbar DDD, cervical DDD  . Cancer Father        prostate,skin,lymphoma.  . Cancer Brother 40       bladder cancer; non-smoker  . Diabetes Maternal Grandmother   . Heart disease Maternal Grandmother   . COPD Maternal Grandmother   . Diabetes Paternal Grandmother   . Obesity Brother   . Diabetes Son   . Hypertension Son   . Cancer Maternal Grandfather   . Diabetes Paternal Grandfather      ROS:  Please see the history of present illness.  Review of Systems  Constitutional: Negative for chills, diaphoresis and fever.  HENT: Positive for congestion. Negative for sinus pain and sore throat.   Eyes: Negative for double vision and pain.  Respiratory: Positive for cough, sputum production, shortness of breath and wheezing. Negative for hemoptysis.   Cardiovascular: Positive for orthopnea, leg swelling and PND. Negative for chest pain, palpitations and claudication.  Gastrointestinal: Negative for abdominal pain, blood in stool and melena.  Genitourinary: Negative for dysuria and hematuria.  Musculoskeletal: Negative for falls and myalgias.  Neurological: Negative for sensory change, seizures and loss of consciousness.   All other ROS reviewed and negative.     Physical Exam/Data:   Vitals:   10/31/18 0100 10/31/18  0130 10/31/18 0235 10/31/18 0817  BP: (!) 165/73 (!) 164/74 (!) 153/67 (!) 154/68  Pulse: 71 68 68 66  Resp: (!) 21 (!) 21 (!) 24 18  Temp:   97.7 F (36.5 C) 98.2 F (36.8 C)  TempSrc:   Oral Oral  SpO2: 97% 97% 97% 100%  Weight:   133.1 kg   Height:   5' 8"  (1.727 m)     Intake/Output Summary (Last 24 hours) at 10/31/2018 1049 Last data filed at 10/31/2018 0900 Gross per 24 hour  Intake 307.53 ml  Output 900 ml  Net -592.47 ml   Last 3 Weights 10/31/2018 10/30/2018 10/29/2018  Weight (lbs) 293 lb 8 oz 292 lb 299 lb 6 oz    Weight (kg) 133.131 kg 132.45 kg 135.796 kg  Some encounter information is confidential and restricted. Go to Review Flowsheets activity to see all data.     Body mass index is 44.63 kg/m.  General:  Well nourished, well developed HEENT: normal, nasal cannula in place Lymph: no adenopathy Neck: JVD elevated, no HJR Endocrine:  No thryomegaly Vascular: No carotid bruits; RA pulses 2+ bilaterally without bruits  Cardiac:  normal S1, S2; RRR; no murmur Lungs:  Coarse bilaterally, clears somewhat with cough but crackles present throughout, mildly diminished at bases Abd: soft, nontender, no hepatomegaly  Ext: bilateral 2+ pitting edema Musculoskeletal:  No deformities, BUE and BLE strength normal and equal Skin: warm and dry  Neuro:  CNs 2-12 intact, no focal abnormalities noted Psych:  Normal affect   EKG:  The EKG was personally reviewed and demonstrates:  Atrial sensed, biv-paced Telemetry:  Telemetry was personally reviewed and demonstrates:  Atrial sensed, biv paced  Relevant CV Studies: LHC 10/05/18  Prox Cx to Dist Cx lesion is 80% stenosed.  Prox LAD-1 lesion is 40% stenosed.  Prox LAD-2 lesion is 90% stenosed.  Prox LAD to Mid LAD lesion is 80% stenosed.  Mid LAD to Dist LAD lesion is 70% stenosed.  1st Diag lesion is 90% stenosed.  Ost 1st Diag lesion is 80% stenosed.  Mid RCA-1 lesion is 40% stenosed.  Mid RCA-2 lesion is 40% stenosed. Final Conclusions:  Severe disease of LAD and diagonal branch Patent proximal LAD stent with severe stenosis at the distal edge of the stent Remainder of the LAD is also small, diffusely diseased, diagonal branch diffusely diseased -RCA large vessel dominant with mild disease, left circumflex/OM with mild disease proximal region  Recommendations:  Case discussed with interventional cardiology, Placing a short stent in the distal edge of the LAD stent may provide little clinical benefit with high risk and may delay surgery on  her osteomyelitis/toe infection. Troponin elevation likely secondary to demand ischemia in the setting of febrile, tachycardic, hypertensive on arrival in the setting of bacteremia --Would favor medical management at this time and proceeding with removal of her toe.   TEE 10/02/18 - Left ventricle: Systolic function was mildly reduced. The   estimated ejection fraction was in the range of 45% to 50%. - Aortic valve: No evidence of vegetation. There was trivial   regurgitation. - Mitral valve: There was mild regurgitation. - Left atrium: No evidence of thrombus in the atrial cavity or   appendage. - Right atrium: No evidence of thrombus in the atrial cavity or   appendage. - Atrial septum: Echo contrast study showed no right-to-left atrial   level shunt, at baseline or with provocation. - Pulmonic valve: No evidence of vegetation.  Impressions: - No evidence of  valve vegetation or wire associated vegetation.  TTE 09/29/18 - Left ventricle: The cavity size was normal. Systolic function was   mildly reduced. The estimated ejection fraction was in the range   of 45% to 50%. The study is not technically sufficient to allow   evaluation of LV diastolic function. - Left atrium: The atrium was mildly dilated.  Impressions: - Very suboptimal study. No evidence of vegetations. Consider TEE.  Laboratory Data:  Chemistry Recent Labs  Lab 10/29/18 1439 10/30/18 2333 10/31/18 0400  NA 139 138 140  K 4.7 4.9 4.3  CL 102 101 102  CO2 29 30 33*  GLUCOSE 100* 174* 182*  BUN 39* 36* 34*  CREATININE 1.53* 1.57* 1.36*  CALCIUM 8.1* 8.0* 8.0*  GFRNONAA 36* 34* 41*  GFRAA 41* 40* 48*  ANIONGAP 8 7 5     No results for input(s): PROT, ALBUMIN, AST, ALT, ALKPHOS, BILITOT in the last 168 hours. Hematology Recent Labs  Lab 10/27/18 1551 10/30/18 2333 10/31/18 0400  WBC 3.8* 6.7 5.9  RBC 2.75* 2.97* 2.87*  HGB 7.9* 8.4* 8.1*  HCT 25.5* 27.8* 26.6*  MCV 92.7 93.6 92.7  MCH 28.7  28.3 28.2  MCHC 31.0 30.2 30.5  RDW 13.9 13.9 13.9  PLT 155 202 189   Cardiac Enzymes Recent Labs  Lab 10/27/18 1551 10/27/18 1916 10/30/18 2333  TROPONINI 0.08* 0.08* 0.03*   No results for input(s): TROPIPOC in the last 168 hours.  BNP Recent Labs  Lab 10/28/18 1323 10/29/18 1439 10/30/18 2333  BNP 1,719.0* 1,936.0* 1,949.0*    DDimer No results for input(s): DDIMER in the last 168 hours.  Radiology/Studies:  Dg Chest 2 View  Result Date: 10/27/2018 CLINICAL DATA:  Shortness of breath.  Recent pneumonia. EXAM: CHEST - 2 VIEW COMPARISON:  10/12/2018 FINDINGS: Cardiac pacemaker/defibrillator in stable position. Enlarged cardiac silhouette. Calcific atherosclerotic disease of the aorta. Interval resolution of previously noted right upper lobe airspace consolidation. Persistent right-sided fissural thickening. Osseous structures are without acute abnormality. Soft tissues are grossly normal. IMPRESSION: Interval resolution of the right upper lobe airspace consolidation. Persistent right-sided fissural thickening. Enlarged cardiac silhouette.  No evidence pulmonary edema. Electronically Signed   By: Fidela Salisbury M.D.   On: 10/27/2018 15:42   Dg Chest Port 1 View  Result Date: 10/30/2018 CLINICAL DATA:  Dyspnea and shortness of breath. EXAM: PORTABLE CHEST 1 VIEW COMPARISON:  Frontal and lateral views 10/27/2018, 10/12/2018 additional prior exams FINDINGS: Tip of the right upper extremity PICC in the mid SVC. Multi lead left-sided pacemaker remains in place. Mild cardiomegaly which is similar to prior exam. Unchanged mediastinal contours with aortic atherosclerosis. Increased interstitial thickening suspicious for pulmonary edema. Ovoid the right mid lung is likely fluid in the fissure. Faint adjacent opacity in the right upper lobe, worsened from prior exam. Suspect small pleural effusions. No pneumothorax. IMPRESSION: 1. Interstitial thickening suspicious for pulmonary edema,  probable small pleural effusions. 2. Ovoid opacity in the right mid lung is likely loculated fluid in the fissure, however nonspecific. Adjacent patchy opacity in the right upper lobe. This may represent atelectasis or recurrent pneumonia given waxing and waning appearance on consecutive radiographs. Electronically Signed   By: Keith Rake M.D.   On: 10/30/2018 23:45    Assessment and Plan:   1. Acute on chronic systolic and diastolic heart failure: exam, CXR, BNP all support acute exacerbation. Unclear trigger. She has had multiple recent admissions/ER visits and appears to have NYHA 4 symptoms.   -home med is  torsemide, which she was taking BID (recently increased per Dr. Caryl Comes). She has improved but still very volume overloaded. I would change to IV lasix 80 mg BID and monitor urine output.   -Has baseline CKD, monitor Cr and electrolytes  -does not appear low output, continue carvedilol and losartan  -fluid restriction, low sodium diet  -weight today 293 lbs (133.1 kg). Down from recent visit with Dr. Caryl Comes (305 lbs) but higher than recent discharge weight of 275 lbs. Has adequate to high blood pressure, would aim for aggressive diuresis. Net negative 500 cc overnight.  2. CAD: last LHC 09/2018, diffuse disease. Had NSTEMI thought likely to be demand from MSSA bacteremia and osteomyelitis  -continue aspirin and clopidogrel  -has what appears to be chronic anemia, somewhat lower than baseline today. Question dilution. Denies active bleeding. If Hgb continues to drop or bleeding detected, would need to discuss holding dual antiplatelet therapy at least temporarily  -continue rosuvastatin 40 mg  -troponins lower than recent admission, no chest pain, no further ischemic workup at this time.   3. OSA: prescribed CPAP at home, but not using routinely. Does not wear home O2 but wearing at night while hospitalized. Has had issues with mask fitting/ripping off nasal pillows while she sleeps. Will  be important to find a way for her to tolerate CPAP at home given her frequent heart failure exacerbations.    For questions or updates, please contact [place] Please consult [email or web address] for contact info under   Signed, Buford Dresser, MD  10/31/2018 10:49 AM

## 2018-10-31 NOTE — Progress Notes (Signed)
[facility] [place] NAME: Kaislee Chao    MR#:  [id number]  DATE OF BIRTH:  16-Oct-1955  SUBJECTIVE:  CHIEF COMPLAINT:   Chief Complaint  Patient presents with  . Shortness of Breath  Patient seen and evaluated by me today Has shortness of breath Has orthopnea On oxygen via nasal cannula No complaints of chest pain Has swelling in the lower extremities  REVIEW OF SYSTEMS:    ROS  CONSTITUTIONAL: No documented fever. No fatigue, weakness. No weight gain, no weight loss.  EYES: No blurry or double vision.  ENT: No tinnitus. No postnasal drip. No redness of the oropharynx.  RESPIRATORY: No cough, no wheeze, no hemoptysis. Has dyspnea.  CARDIOVASCULAR: No chest pain. No orthopnea. No palpitations. No syncope.  GASTROINTESTINAL: No nausea, no vomiting or diarrhea. No abdominal pain. No melena or hematochezia.  GENITOURINARY: No dysuria or hematuria.  ENDOCRINE: No polyuria or nocturia. No heat or cold intolerance.  HEMATOLOGY: No anemia. No bruising. No bleeding.  INTEGUMENTARY: No rashes. No lesions.  MUSCULOSKELETAL: No arthritis. Has swelling. No gout.  NEUROLOGIC: No numbness, tingling, or ataxia. No seizure-type activity.  PSYCHIATRIC: No anxiety. No insomnia. No ADD.   DRUG ALLERGIES:  No Known Allergies  VITALS:  Blood pressure (!) 154/68, pulse 66, temperature 98.2 F (36.8 C), temperature source Oral, resp. rate 18, height 5\' 8"  (1.727 m), weight 133.1 kg, SpO2 100 %.  PHYSICAL EXAMINATION:   Physical Exam  GENERAL:  62 y.o.-year-old patient lying in the bed on oxygen via nasal cannula EYES: Pupils equal, round, reactive to light and accommodation. No scleral icterus. Extraocular muscles intact.  HEENT: Head atraumatic, normocephalic. Oropharynx and nasopharynx clear.  NECK:  Supple, no jugular venous distention. No thyroid enlargement, no tenderness.  LUNGS: Decreased breath sounds bilaterally, bibasilar crepitations  heard. No use of accessory muscles of respiration.  CARDIOVASCULAR: S1, S2 normal. No murmurs, rubs, or gallops.  ABDOMEN: Soft, nontender, nondistended. Bowel sounds present. No organomegaly or mass.  EXTREMITIES: No cyanosis, clubbing  Bilateral edema .    NEUROLOGIC: Cranial nerves II through XII are intact. No focal Motor or sensory deficits b/l.   PSYCHIATRIC: The patient is alert and oriented x 3.  SKIN: No obvious rash, lesion, or ulcer.   LABORATORY PANEL:   CBC Recent Labs  Lab 10/31/18 0400  WBC 5.9  HGB 8.1*  HCT 26.6*  PLT 189   ------------------------------------------------------------------------------------------------------------------ Chemistries  Recent Labs  Lab 10/31/18 0400  NA 140  K 4.3  CL 102  CO2 33*  GLUCOSE 182*  BUN 34*  CREATININE 1.36*  CALCIUM 8.0*   ------------------------------------------------------------------------------------------------------------------  Cardiac Enzymes Recent Labs  Lab 10/30/18 2333  TROPONINI 0.03*   ------------------------------------------------------------------------------------------------------------------  RADIOLOGY:  Dg Chest Port 1 View  Result Date: 10/30/2018 CLINICAL DATA:  Dyspnea and shortness of breath. EXAM: PORTABLE CHEST 1 VIEW COMPARISON:  Frontal and lateral views 10/27/2018, 10/12/2018 additional prior exams FINDINGS: Tip of the right upper extremity PICC in the mid SVC. Multi lead left-sided pacemaker remains in place. Mild cardiomegaly which is similar to prior exam. Unchanged mediastinal contours with aortic atherosclerosis. Increased interstitial thickening suspicious for pulmonary edema. Ovoid the right mid lung is likely fluid in the fissure. Faint adjacent opacity in the right upper lobe, worsened from prior exam. Suspect small pleural effusions. No pneumothorax. IMPRESSION: 1. Interstitial thickening suspicious for pulmonary edema, probable small pleural effusions. 2. Ovoid  opacity in the right mid lung is likely loculated fluid in  the fissure, however nonspecific. Adjacent patchy opacity in the right upper lobe. This may represent atelectasis or recurrent pneumonia given waxing and waning appearance on consecutive radiographs. Electronically Signed   By: Keith Rake M.D.   On: 10/30/2018 23:45     ASSESSMENT AND PLAN:   63 year old female patient with history of myocardial infarction in the past, systolic heart failure with EF of 35%, cardioverter defibrillator, coronary artery disease, history of osteomyelitis, neuropathy, obesity, hyperlipidemia, type 2 diabetes mellitus, chronic kidney disease currently under hospitalist service for shortness of breath  -Acute on chronic systolic heart failure exacerbation Diurese patient with IV Lasix 80 mg every 12 hourly Monitor electrolytes Daily body weights and input output chart Changed to inpatient status Patient had a recent echocardiogram 1 month ago Will review the report  -Coronary artery disease with history of MI Continue aspirin, Plavix, beta-blocker  -Dyspnea secondary to heart failure exacerbation Continue oxygen via nasal cannula  -Chronic kidney disease stage III Monitor renal function  -Type 2 diabetes mellitus Diabetic diet and sliding scale coverage with insulin  -DVT prophylaxis subcu heparin  -Obstructive sleep apnea CPAP on bedtime  All the records are reviewed and case discussed with Care Management/Social Worker. Management plans discussed with the patient, family and they are in agreement.  CODE STATUS: Full code  DVT Prophylaxis: SCDs  TOTAL TIME TAKING CARE OF THIS PATIENT: 36 minutes.   POSSIBLE D/C IN 2 to 3 DAYS, DEPENDING ON CLINICAL CONDITION.  Saundra Shelling M.D on 10/31/2018 at 12:06 PM  Between 7am to 6pm - Pager - [phone]  After 6pm go to [email or web address] - password [email or web address] [facility]  Office  [phone]  CC: Primary care  physician; Rutherford Guys, MD  Note: This dictation was prepared with Dragon dictation along with smaller phrase technology. Any transcriptional errors that result from this process are unintentional.

## 2018-10-31 NOTE — H&P (Signed)
[facility] [place] NAME: [name]    MR#:  [id number]  DATE OF BIRTH:  July 26, 1956  DATE OF ADMISSION:  10/30/2018  PRIMARY CARE PHYSICIAN: Rutherford Guys, MD   REQUESTING/REFERRING PHYSICIAN: Owens Shark, MD  CHIEF COMPLAINT:   Chief Complaint  Patient presents with  . Shortness of Breath    HISTORY OF PRESENT ILLNESS:  [name]  is a 63 y.o. female who presents with chief complaint as above.  Patient presents with shortness of breath.  She has a history of heart failure and states that she has been to the ED even just earlier this week with increased shortness of breath which required a dose of Lasix.  She is gotten Lasix 2 other times this week per her report.  She states she was feeling better yesterday morning, but then in the evening became acutely short of breath again.  She has had significant lower extremity edema which has been fairly persistent throughout the week.  Here in the ED her work-up is consistent with acute on chronic heart failure.  She has significant fluid in her lungs on chest x-ray, and she was given a dose of IV Lasix in the ED with good urine output and some improvement in her breathing.  Hospitalist were called for admission  PAST MEDICAL HISTORY:   Past Medical History:  Diagnosis Date  . Acute myocardial infarction, subendocardial infarction, initial episode of care ([place]) 07/21/2012  . Acute osteomyelitis involving ankle and foot ([place]) 02/06/2015  . Acute systolic heart failure ([place]) 07/21/2012   New onset 07/19/12; admission to [facility] ED. Elevated Troponins.  S/p 2D-echo with EF 20-25%.  S/p cardiac catheterization with stenting LAD.  Repeat 2D-echo 10/2011 with improved EF of 35%.   . Anemia   . Automatic implantable cardioverter-defibrillator in situ    a. MDT CRT-D 06/2014, SN: [id number]    . CAD (coronary artery disease)    a. cardiac cath 101/04/2012: PCI/DES to chronically occluded  mLAD, consideration PCI to diag branch in 4 weeks.   . Cataract   . Chicken pox   . Chronic kidney disease   . Chronic systolic CHF (congestive heart failure) (HCC)    a. mixed ICM & NICM; b. EF 20-25% by echo 07/2012, mid-dist 2/3 of LV sev HK/AK, mild MR. echo 10/2012: EF 30-35%, sev HK ant-septal & inf walls, GR1DD, mild MR, PASP 33. c. echo 02/2013: EF 30%, GR1DD, mild MR. echo 04/2014: EF 30%, Septal-lat dyssynchrony, global HK, inf AK, GR1DD, mild MR. d. echo 10/2014: EF50-55%, WM nl, GR1DD, septal mild paradox. e. echo 02/2015: EF 50-55%, wm   . Depression   . Heart attack ([place])   . Heel ulcer ([place]) 04/27/2015  . History of blood transfusion ~ 2011   "plasma; had neuropathy; couldn't walk"  . Hypertension   . LBBB (left bundle branch block)   . Neuromuscular disorder ([place])   . Neuropathy 2011  . Obesity, unspecified   . OSA on CPAP    Moderate with AHI 23/hr and now on CPAP at 16cm H2O.  Her DME is [facility]  . Pure hypercholesterolemia   . Type II diabetes mellitus ([place])   . Unspecified vitamin D deficiency      PAST SURGICAL HISTORY:   Past Surgical History:  Procedure Laterality Date  . AMPUTATION TOE Right 06/18/2015   Procedure: AMPUTATION TOE;  Surgeon: Samara Deist, DPM;  Location: [facility] ORS;  Service: Podiatry;  Laterality: Right;  .  AMPUTATION TOE Left 10/08/2018   Procedure: AMPUTATION TOE LEFT 2ND;  Surgeon: Albertine Patricia, DPM;  Location: [facility] ORS;  Service: Podiatry;  Laterality: Left;  . BACK SURGERY    . BI-VENTRICULAR IMPLANTABLE CARDIOVERTER DEFIBRILLATOR N/A 07/06/2014   Procedure: BI-VENTRICULAR IMPLANTABLE CARDIOVERTER DEFIBRILLATOR  (CRT-D);  Surgeon: Deboraha Sprang, MD;  Location: [facility] CATH LAB;  Service: Cardiovascular;  Laterality: N/A;  . BI-VENTRICULAR IMPLANTABLE CARDIOVERTER DEFIBRILLATOR  (CRT-D)  07/06/2014  . BILATERAL OOPHORECTOMY  01/2011   ovarian cyst benign  . COLONOSCOPY WITH PROPOFOL Left 02/22/2015   Procedure: COLONOSCOPY WITH PROPOFOL;  Surgeon:  Hulen Luster, MD;  Location: [facility] ENDOSCOPY;  Service: Endoscopy;  Laterality: Left;  . CORONARY ANGIOPLASTY WITH STENT PLACEMENT Left 07/2012   new onset systolic CHF; elevated troponins.  Cardiac catheterization with stenting to LAD; EF 15%.  2D-echo: EF 20-25%.  . ESOPHAGOGASTRODUODENOSCOPY N/A 02/22/2015   Procedure: ESOPHAGOGASTRODUODENOSCOPY (EGD);  Surgeon: Hulen Luster, MD;  Location: [facility] ENDOSCOPY;  Service: Endoscopy;  Laterality: N/A;  . INCISION AND DRAINAGE ABSCESS Right 2007   groin; with ICU stay due to sepsis.  Marland Kitchen LAPAROSCOPIC CHOLECYSTECTOMY  2011  . LEFT HEART CATH AND CORONARY ANGIOGRAPHY N/A 10/05/2018   Procedure: LEFT HEART CATH AND CORONARY ANGIOGRAPHY;  Surgeon: Minna Merritts, MD;  Location: [place] CV LAB;  Service: Cardiovascular;  Laterality: N/A;  . LEFT HEART CATHETERIZATION WITH CORONARY ANGIOGRAM N/A 07/21/2012   Procedure: LEFT HEART CATHETERIZATION WITH CORONARY ANGIOGRAM;  Surgeon: Jolaine Artist, MD;  Location: [facility] CATH LAB;  Service: Cardiovascular;  Laterality: N/A;  . [place]   L4-5  . PERCUTANEOUS CORONARY STENT INTERVENTION (PCI-S) N/A 07/23/2012   Procedure: PERCUTANEOUS CORONARY STENT INTERVENTION (PCI-S);  Surgeon: Sherren Mocha, MD;  Location: [facility] CATH LAB;  Service: Cardiovascular;  Laterality: N/A;  . PERIPHERAL VASCULAR BALLOON ANGIOPLASTY Left 10/06/2018   Procedure: PERIPHERAL VASCULAR BALLOON ANGIOPLASTY;  Surgeon: Katha Cabal, MD;  Location: [place] CV LAB;  Service: Cardiovascular;  Laterality: Left;  . PERIPHERAL VASCULAR CATHETERIZATION N/A 02/10/2015   Procedure: Picc Line Insertion;  Surgeon: Katha Cabal, MD;  Location: [place] CV LAB;  Service: Cardiovascular;  Laterality: N/A;  . TEE WITHOUT CARDIOVERSION N/A 10/02/2018   Procedure: TRANSESOPHAGEAL ECHOCARDIOGRAM (TEE);  Surgeon: Wellington Hampshire, MD;  Location: [facility] ORS;  Service: Cardiovascular;  Laterality: N/A;  . [place]   . VAGINAL HYSTERECTOMY  01/2011   Fibroids/DUB.  Ovaries removed. [place].     SOCIAL HISTORY:   Social History   Tobacco Use  . Smoking status: Never Smoker  . Smokeless tobacco: Never Used  Substance Use Topics  . Alcohol use: No    Alcohol/week: 0.0 standard drinks     FAMILY HISTORY:   Family History  Problem Relation Age of Onset  . Diabetes Mother   . Hypertension Mother   . Arthritis Mother        knees, lumbar DDD, cervical DDD  . Cancer Father        prostate,skin,lymphoma.  . Cancer Brother 80       bladder cancer; non-smoker  . Diabetes Maternal Grandmother   . Heart disease Maternal Grandmother   . COPD Maternal Grandmother   . Diabetes Paternal Grandmother   . Obesity Brother   . Diabetes Son   . Hypertension Son   . Cancer Maternal Grandfather   . Diabetes Paternal Grandfather      DRUG ALLERGIES:  No Known Allergies  MEDICATIONS AT  HOME:   Prior to Admission medications   Medication Sig Start Date End Date Taking? Authorizing Provider  aspirin EC 81 MG EC tablet Take 1 tablet (81 mg total) by mouth daily. 10/10/18  Yes Salary, Avel Peace, MD  Calcium Carb-Cholecalciferol (CALCIUM 600 + D PO) Take 1 tablet by mouth 2 (two) times daily.   Yes [provider]  carvedilol (COREG) 12.5 MG tablet Take 1 tablet (12.5 mg total) by mouth 2 (two) times daily with a meal. 10/09/18  Yes Salary, Montell D, MD  ceFAZolin (ANCEF) IVPB Inject 2 g into the vein every 8 (eight) hours. Indication: MSSA bacteremia and toe infection Last Day of Therapy:  11/10/2018 Labs - Once weekly:  CBC/D and BMP, Labs - Every other week:  ESR and CRP 10/16/18 11/16/18 Yes Wieting, Richard, MD  citalopram (CELEXA) 20 MG tablet Take 1 tablet (20 mg total) by mouth daily. 04/15/18  Yes Wardell Honour, MD  clopidogrel (PLAVIX) 75 MG tablet Take 1 tablet (75 mg total) by mouth daily. 10/10/18  Yes Salary, Montell D, MD  ferrous sulfate 325 (65 FE) MG tablet Take 325 mg by mouth  daily.   Yes [provider]  glimepiride (AMARYL) 4 MG tablet Take 1 tablet (4 mg total) by mouth 2 (two) times daily. 10/22/18  Yes Rutherford Guys, MD  insulin glargine (LANTUS) 100 UNIT/ML injection Inject 0.16 mLs (16 Units total) into the skin at bedtime. 10/16/18  Yes Wieting, Richard, MD  losartan (COZAAR) 25 MG tablet Take 1 tablet (25 mg total) by mouth daily. 10/10/18  Yes Salary, Holly Bodily D, MD  omega-3 acid ethyl esters (LOVAZA) 1 g capsule Take 1 g by mouth 2 (two) times daily.   Yes [provider]  rosuvastatin (CRESTOR) 40 MG tablet TAKE 1 TABLET BY MOUTH ONCE DAILY Patient taking differently: Take 40 mg by mouth at bedtime.  12/16/17  Yes Gollan, Kathlene November, MD  sodium chloride flush (NS) 0.9 % SOLN 20 mLs by Intracatheter route every 8 (eight) hours. 10/16/18  Yes Loletha Grayer, MD  torsemide (DEMADEX) 20 MG tablet Take 2 tablets (40 mg) by mouth once daily Patient taking differently: Take 40 mg by mouth 2 (two) times daily.  10/27/18  Yes Deboraha Sprang, MD  AMBULATORY NON FORMULARY MEDICATION 1 Units by Other route once. Medication Name: foot brace 11/15/15   Narda Amber K, DO  glucose blood (ONE TOUCH ULTRA TEST) test strip Check sugar three times daily dx: DMII uncontrolled insulin dependent with complications 7/37/10   Wardell Honour, MD  Insulin Pen Needle (B-D ULTRAFINE III SHORT PEN) 31G X 8 MM MISC USE AS DIRECTED WITH LANTUS; dx: DMII uncontrolled, insulin dependent, with complications 04/01/93   Wardell Honour, MD  nitroGLYCERIN (NITROSTAT) 0.4 MG SL tablet Place 1 tablet (0.4 mg total) under the tongue every 5 (five) minutes as needed for chest pain. 10/09/18   Minna Merritts, MD    REVIEW OF SYSTEMS:  Review of Systems  Constitutional: Negative for chills, fever, malaise/fatigue and weight loss.  HENT: Negative for ear pain, hearing loss and tinnitus.   Eyes: Negative for blurred vision, double vision, pain and redness.  Respiratory: Positive for  shortness of breath. Negative for cough and hemoptysis.   Cardiovascular: Positive for leg swelling. Negative for chest pain, palpitations and orthopnea.  Gastrointestinal: Negative for abdominal pain, constipation, diarrhea, nausea and vomiting.  Genitourinary: Negative for dysuria, frequency and hematuria.  Musculoskeletal: Negative for back pain, joint  pain and neck pain.  Skin:       No acne, rash, or lesions  Neurological: Negative for dizziness, tremors, focal weakness and weakness.  Endo/Heme/Allergies: Negative for polydipsia. Does not bruise/bleed easily.  Psychiatric/Behavioral: Negative for depression. The patient is not nervous/anxious and does not have insomnia.      VITAL SIGNS:   Vitals:   10/30/18 2331 10/31/18 0000 10/31/18 0030 10/31/18 0100  BP: (!) 160/75 (!) 157/65 (!) 164/67 (!) 165/73  Pulse:  69 70 71  Resp:  19 17 (!) 21  Temp: 97.9 F (36.6 C)     TempSrc:      SpO2:  99% 97% 97%  Weight:      Height:       Wt Readings from Last 3 Encounters:  10/30/18 132.5 kg  10/29/18 135.8 kg  10/28/18 (!) 137.2 kg    PHYSICAL EXAMINATION:  Physical Exam  Vitals reviewed. Constitutional: She is oriented to person, place, and time. She appears well-developed and well-nourished. No distress.  HENT:  Head: Normocephalic and atraumatic.  Mouth/Throat: Oropharynx is clear and moist.  Eyes: Pupils are equal, round, and reactive to light. Conjunctivae and EOM are normal. No scleral icterus.  Neck: Normal range of motion. Neck supple. No JVD present. No thyromegaly present.  Cardiovascular: Normal rate, regular rhythm and intact distal pulses. Exam reveals no gallop and no friction rub.  No murmur heard. Respiratory: Effort normal. No respiratory distress. She has no wheezes. She has rales.  GI: Soft. Bowel sounds are normal. She exhibits no distension. There is no abdominal tenderness.  Musculoskeletal: Normal range of motion.        General: No edema.      Comments: No arthritis, no gout  Lymphadenopathy:    She has no cervical adenopathy.  Neurological: She is alert and oriented to person, place, and time. No cranial nerve deficit.  No dysarthria, no aphasia  Skin: Skin is warm and dry. No rash noted. No erythema.  Psychiatric: She has a normal mood and affect. Her behavior is normal. Judgment and thought content normal.    LABORATORY PANEL:   CBC Recent Labs  Lab 10/30/18 2333  WBC 6.7  HGB 8.4*  HCT 27.8*  PLT 202   ------------------------------------------------------------------------------------------------------------------  Chemistries  Recent Labs  Lab 10/30/18 2333  NA 138  K 4.9  CL 101  CO2 30  GLUCOSE 174*  BUN 36*  CREATININE 1.57*  CALCIUM 8.0*   ------------------------------------------------------------------------------------------------------------------  Cardiac Enzymes Recent Labs  Lab 10/30/18 2333  TROPONINI 0.03*   ------------------------------------------------------------------------------------------------------------------  RADIOLOGY:  Dg Chest Port 1 View  Result Date: 10/30/2018 CLINICAL DATA:  Dyspnea and shortness of breath. EXAM: PORTABLE CHEST 1 VIEW COMPARISON:  Frontal and lateral views 10/27/2018, 10/12/2018 additional prior exams FINDINGS: Tip of the right upper extremity PICC in the mid SVC. Multi lead left-sided pacemaker remains in place. Mild cardiomegaly which is similar to prior exam. Unchanged mediastinal contours with aortic atherosclerosis. Increased interstitial thickening suspicious for pulmonary edema. Ovoid the right mid lung is likely fluid in the fissure. Faint adjacent opacity in the right upper lobe, worsened from prior exam. Suspect small pleural effusions. No pneumothorax. IMPRESSION: 1. Interstitial thickening suspicious for pulmonary edema, probable small pleural effusions. 2. Ovoid opacity in the right mid lung is likely loculated fluid in the fissure,  however nonspecific. Adjacent patchy opacity in the right upper lobe. This may represent atelectasis or recurrent pneumonia given waxing and waning appearance on consecutive radiographs. Electronically Signed  By: Keith Rake M.D.   On: 10/30/2018 23:45    EKG:   Orders placed or performed during the hospital encounter of 10/30/18  . ED EKG  . ED EKG    IMPRESSION AND PLAN:  Principal Problem:   Acute on chronic systolic CHF (congestive heart failure) (HCC) -IV Lasix given in the ED with some improvement in her breathing and good urine output.  We will give another dose of IV Lasix tonight, continue her home meds, and get a cardiology consult.  She had an echocardiogram done just last month, so I will not reorder that at this time Active Problems:   Diabetic peripheral neuropathy associated with type 2 diabetes mellitus ([place]) -sliding scale insulin coverage   Essential hypertension, benign -continue home medications   OSA (obstructive sleep apnea) -CPAP nightly   Pure hypercholesterolemia -continue home dose antilipid   Chronic renal insufficiency -renal function is at baseline, avoid nephrotoxins and monitor  Chart review performed and case discussed with ED provider. Labs, imaging and/or ECG reviewed by provider and discussed with patient/family. Management plans discussed with the patient and/or family.  DVT PROPHYLAXIS: SubQ heparin  GI PROPHYLAXIS:  None  ADMISSION STATUS: Observation  CODE STATUS: Full Code Status History    Date Active Date Inactive Code Status Order ID Comments User Context   10/13/2018 0047 10/16/2018 1900 DNR [id number]  Gorden Harms, MD Inpatient   09/28/2018 1456 10/09/2018 1726 Full Code [id number]  Nicholes Mango, MD ED   11/11/2015 1411 11/12/2015 1635 Full Code [id number]  Vaughan Basta, MD Inpatient   06/17/2015 0148 06/19/2015 1659 Full Code [id number]  Harrie Foreman, MD Inpatient   02/17/2015 1330 02/24/2015 1925 DNR [id number]  Bettey Costa, MD ED   07/06/2014 1100 07/07/2014 1524 Full Code [id number]  Deboraha Sprang, MD Inpatient   07/22/2012 1427 07/26/2012 1507 DNR [id number]  Nolon Rod, DO Inpatient    Questions for Most Recent Historical Code Status (Order [id number])    Question Answer Comment   In the event of cardiac or respiratory ARREST Do not call a "code blue"    In the event of cardiac or respiratory ARREST Do not perform Intubation, CPR, defibrillation or ACLS    In the event of cardiac or respiratory ARREST Use medication by any route, position, wound care, and other measures to relive pain and suffering. May use oxygen, suction and manual treatment of airway obstruction as needed for comfort.    Comments Nurse may pronounce         Advance Directive Documentation     Most Recent Value  Type of Advance Directive  Living will  Pre-existing out of facility DNR order (yellow form or pink MOST form)  -  "MOST" Form in Place?  -      TOTAL TIME TAKING CARE OF THIS PATIENT: 40 minutes.   Ethlyn Daniels 10/31/2018, 1:25 AM  [facility]  Office  [phone]  CC: Primary care physician; Rutherford Guys, MD  Note:  This document was prepared using Dragon voice recognition software and may include unintentional dictation errors.

## 2018-10-31 NOTE — Plan of Care (Signed)
  Problem: Clinical Measurements: Goal: Will remain free from infection Outcome: Not Progressing  Patient has pneumonia  Problem: Clinical Measurements: Goal: Cardiovascular complication will be avoided Outcome: Progressing   Problem: Skin Integrity: Goal: Risk for impaired skin integrity will decrease Outcome: Progressing   Problem: Clinical Measurements: Goal: Ability to maintain a body temperature in the normal range will improve Outcome: Progressing   Problem: Respiratory: Goal: Ability to maintain adequate ventilation will improve Outcome: Progressing

## 2018-10-31 NOTE — Progress Notes (Signed)
Advanced care plan.  Purpose of the Encounter: CODE STATUS  Parties in Attendance: Patient  Patient's Decision Capacity: Good  Subjective/Patient's story: Presented to emergency room for shortness of breath  Objective/Medical story Patient has increased swelling in the legs and short of breath secondary to heart failure exacerbation Needs aggressive diuresis with IV Lasix and oxygen supplementation via nasal cannula Cardiology evaluation and work-up  Goals of care determination:  Advance care directives goals of care and treatment plan discussed Patient wants everything done which includes CPR, intubation ventilator the need arises   CODE STATUS: Full code   Time spent discussing advanced care planning: 16 minutes

## 2018-10-31 NOTE — ED Notes (Signed)
External urinary catheter applied.

## 2018-11-01 LAB — BASIC METABOLIC PANEL
Anion gap: 5 (ref 5–15)
BUN: 27 mg/dL — ABNORMAL HIGH (ref 8–23)
CO2: 36 mmol/L — ABNORMAL HIGH (ref 22–32)
Calcium: 7.9 mg/dL — ABNORMAL LOW (ref 8.9–10.3)
Chloride: 100 mmol/L (ref 98–111)
Creatinine, Ser: 1.24 mg/dL — ABNORMAL HIGH (ref 0.44–1.00)
GFR calc Af Amer: 53 mL/min — ABNORMAL LOW (ref >60)
GFR calc non Af Amer: 46 mL/min — ABNORMAL LOW (ref >60)
Glucose, Bld: 136 mg/dL — ABNORMAL HIGH (ref 70–99)
POTASSIUM: 3.8 mmol/L (ref 3.5–5.1)
SODIUM: 141 mmol/L (ref 135–145)

## 2018-11-01 LAB — GLUCOSE, CAPILLARY
GLUCOSE-CAPILLARY: 208 mg/dL — AB (ref 70–99)
Glucose-Capillary: 126 mg/dL — ABNORMAL HIGH (ref 70–99)
Glucose-Capillary: 193 mg/dL — ABNORMAL HIGH (ref 70–99)
Glucose-Capillary: 183 mg/dL — ABNORMAL HIGH (ref 70–99)

## 2018-11-01 MED ORDER — POTASSIUM CHLORIDE CRYS ER 20 MEQ PO TBCR
40.0000 meq | EXTENDED_RELEASE_TABLET | Freq: Every day | ORAL | Status: DC
  Administered 2018-11-02 – 2018-11-04 (×2): 40 meq via ORAL
  Filled 2018-11-01 (×3): qty 2

## 2018-11-01 NOTE — Progress Notes (Signed)
Placed patient on cpap of 8cm.  Family member states it has been a very long time since she has worn one and she may not be able to wear. Patient able to tolerate 8 cm of pressure of now. Will continue to monitor

## 2018-11-01 NOTE — Progress Notes (Signed)
[name] at [place] NAME: [name]    MR#:  [id number]  DATE OF BIRTH:  04-13-1956  SUBJECTIVE:  CHIEF COMPLAINT:   Chief Complaint  Patient presents with  . Shortness of Breath  Patient seen and evaluated by me today Has shortness of breath on exertion Has orthopnea two-pillow On oxygen via nasal cannula at 2 L No complaints of chest pain  swelling in the lower extremities improving slowly  REVIEW OF SYSTEMS:    ROS  CONSTITUTIONAL: No documented fever. No fatigue, weakness. No weight gain, no weight loss.  EYES: No blurry or double vision.  ENT: No tinnitus. No postnasal drip. No redness of the oropharynx.  RESPIRATORY: No cough, no wheeze, no hemoptysis. Has dyspnea.  CARDIOVASCULAR: No chest pain. No orthopnea. No palpitations. No syncope.  GASTROINTESTINAL: No nausea, no vomiting or diarrhea. No abdominal pain. No melena or hematochezia.  GENITOURINARY: No dysuria or hematuria.  ENDOCRINE: No polyuria or nocturia. No heat or cold intolerance.  HEMATOLOGY: No anemia. No bruising. No bleeding.  INTEGUMENTARY: No rashes. No lesions.  MUSCULOSKELETAL: No arthritis. Has swelling. No gout.  NEUROLOGIC: No numbness, tingling, or ataxia. No seizure-type activity.  PSYCHIATRIC: No anxiety. No insomnia. No ADD.   DRUG ALLERGIES:  No Known Allergies  VITALS:  Blood pressure (!) 154/67, pulse 66, temperature 98.5 F (36.9 C), temperature source Oral, resp. rate 18, height 5\' 8"  (1.727 m), weight 131.8 kg, SpO2 100 %.  PHYSICAL EXAMINATION:   Physical Exam  GENERAL:  63 y.o.-year-old patient lying in the bed on oxygen via nasal cannula EYES: Pupils equal, round, reactive to light and accommodation. No scleral icterus. Extraocular muscles intact.  HEENT: Head atraumatic, normocephalic. Oropharynx and nasopharynx clear.  NECK:  Supple, no jugular venous distention. No thyroid enlargement, no tenderness.  LUNGS: Decreased breath  sounds bilaterally, bibasilar crepitations heard. No use of accessory muscles of respiration.  CARDIOVASCULAR: S1, S2 normal. No murmurs, rubs, or gallops.  ABDOMEN: Soft, nontender, nondistended. Bowel sounds present. No organomegaly or mass.  EXTREMITIES: No cyanosis, clubbing  Bilateral edema .    NEUROLOGIC: Cranial nerves II through XII are intact. No focal Motor or sensory deficits b/l.   PSYCHIATRIC: The patient is alert and oriented x 3.  SKIN: No obvious rash, lesion, or ulcer.   LABORATORY PANEL:   CBC Recent Labs  Lab 10/31/18 0400  WBC 5.9  HGB 8.1*  HCT 26.6*  PLT 189   ------------------------------------------------------------------------------------------------------------------ Chemistries  Recent Labs  Lab 11/01/18 0837  NA 141  K 3.8  CL 100  CO2 36*  GLUCOSE 136*  BUN 27*  CREATININE 1.24*  CALCIUM 7.9*   ------------------------------------------------------------------------------------------------------------------  Cardiac Enzymes Recent Labs  Lab 10/30/18 2333  TROPONINI 0.03*   ------------------------------------------------------------------------------------------------------------------  RADIOLOGY:  Dg Chest Port 1 View  Result Date: 10/30/2018 CLINICAL DATA:  Dyspnea and shortness of breath. EXAM: PORTABLE CHEST 1 VIEW COMPARISON:  Frontal and lateral views 10/27/2018, 10/12/2018 additional prior exams FINDINGS: Tip of the right upper extremity PICC in the mid SVC. Multi lead left-sided pacemaker remains in place. Mild cardiomegaly which is similar to prior exam. Unchanged mediastinal contours with aortic atherosclerosis. Increased interstitial thickening suspicious for pulmonary edema. Ovoid the right mid lung is likely fluid in the fissure. Faint adjacent opacity in the right upper lobe, worsened from prior exam. Suspect small pleural effusions. No pneumothorax. IMPRESSION: 1. Interstitial thickening suspicious for pulmonary edema,  probable small pleural effusions. 2. Ovoid opacity in the  right mid lung is likely loculated fluid in the fissure, however nonspecific. Adjacent patchy opacity in the right upper lobe. This may represent atelectasis or recurrent pneumonia given waxing and waning appearance on consecutive radiographs. Electronically Signed   By: Keith Rake M.D.   On: 10/30/2018 23:45     ASSESSMENT AND PLAN:   63 year old female patient with history of myocardial infarction in the past, systolic heart failure with EF of 35%, cardioverter defibrillator, coronary artery disease, history of osteomyelitis, neuropathy, obesity, hyperlipidemia, type 2 diabetes mellitus, chronic kidney disease currently under hospitalist service for shortness of breath  -Acute on chronic systolic heart failure exacerbation improving Diurese patient with IV Lasix 80 mg every 12 hourly Monitor electrolytes Potassium supplementation Daily body weights and input output chart Patient had a recent echocardiogram 1 month ago which shows EF of 40 to 45% with reduced systolic function  -Coronary artery disease with history of MI Continue aspirin, Plavix, beta-blocker  -Dyspnea secondary to heart failure exacerbation Continue oxygen via nasal cannula Once diuresed well will wean off oxygen to room air Patient uses oxygen at nighttime at home via nasal cannula  -Chronic kidney disease stage III Monitor renal function  -Type 2 diabetes mellitus Diabetic diet and sliding scale coverage with insulin  -DVT prophylaxis subcu heparin  -Obstructive sleep apnea CPAP on bedtime  All the records are reviewed and case discussed with Care Management/Social Worker. Management plans discussed with the patient, family and they are in agreement.  CODE STATUS: Full code  DVT Prophylaxis: SCDs  TOTAL TIME TAKING CARE OF THIS PATIENT: 34 minutes.   POSSIBLE D/C IN 2 to 3 DAYS, DEPENDING ON CLINICAL CONDITION.  Saundra Shelling M.D on  11/01/2018 at 10:24 AM  Between 7am to 6pm - Pager - [phone]  After 6pm go to [email or web address] - password [email or web address] [facility]  Office  [phone]  CC: Primary care physician; Rutherford Guys, MD  Note: This dictation was prepared with Dragon dictation along with smaller phrase technology. Any transcriptional errors that result from this process are unintentional.

## 2018-11-01 NOTE — Progress Notes (Signed)
Progress Note  Patient Name: [name] Date of Encounter: 11/01/2018  Primary Cardiologist: Dr. Rockey Situ  Subjective   Feeling slightly better today, though still short of breath. Didn't try to exert herself at all yesterday. Plans to get into chair today. No chest pain.   Inpatient Medications    Scheduled Meds: . aspirin EC  81 mg Oral Daily  . carvedilol  12.5 mg Oral BID WC  . citalopram  20 mg Oral Daily  . clopidogrel  75 mg Oral Daily  . furosemide  80 mg Intravenous BID  . heparin  5,000 Units Subcutaneous Q8H  . insulin aspart  0-5 Units Subcutaneous QHS  . insulin aspart  0-9 Units Subcutaneous TID WC  . losartan  25 mg Oral Daily  . mouth rinse  15 mL Mouth Rinse BID  . potassium chloride  40 mEq Oral Daily  . rosuvastatin  40 mg Oral QHS  . sodium chloride flush  10 mL Intravenous Q12H  . sodium chloride flush  10-40 mL Intracatheter Q12H   Continuous Infusions: . sodium chloride 10 mL/hr at 11/01/18 0604  . ceFAZolin 2 g (11/01/18 0606)   PRN Meds: sodium chloride, acetaminophen **OR** acetaminophen, ondansetron **OR** ondansetron (ZOFRAN) IV, sodium chloride flush   Vital Signs    Vitals:   10/31/18 1714 10/31/18 1913 11/01/18 0344 11/01/18 0835  BP:  (!) 146/69 (!) 156/65 (!) 154/67  Pulse:  68 66 66  Resp:  18 18   Temp:  97.7 F (36.5 C) 98 F (36.7 C) 98.5 F (36.9 C)  TempSrc:  Oral Oral Oral  SpO2: 100% 99% 100% 100%  Weight:   131.8 kg   Height:        Intake/Output Summary (Last 24 hours) at 11/01/2018 1213 Last data filed at 11/01/2018 1021 Gross per 24 hour  Intake 954.39 ml  Output 3800 ml  Net -2845.61 ml   Last 3 Weights 11/01/2018 10/31/2018 10/30/2018  Weight (lbs) 290 lb 8 oz 293 lb 8 oz 292 lb  Weight (kg) 131.77 kg 133.131 kg 132.45 kg  Some encounter information is confidential and restricted. Go to Review Flowsheets activity to see all data.      Telemetry    Atrial sensed, bi-v paced - Personally  Reviewed  ECG    No new since 1/24 - Personally Reviewed  Physical Exam   GEN: No acute distress.   Neck: JVD still elevated, angle of jaw at about 45 degrees, no HJR Cardiac: RRR, no murmurs, rubs, or gallops.  Respiratory: coarse bilaterally with crackles throughout and dullness at bilateral bases GI: Soft, nontender, non-distended  MS: bilateral LE edema, mildly improve to 1-2+ today; No deformity. Neuro:  Nonfocal  Psych: Normal affect   Labs    Chemistry Recent Labs  Lab 10/30/18 2333 10/31/18 0400 11/01/18 0837  NA 138 140 141  K 4.9 4.3 3.8  CL 101 102 100  CO2 30 33* 36*  GLUCOSE 174* 182* 136*  BUN 36* 34* 27*  CREATININE 1.57* 1.36* 1.24*  CALCIUM 8.0* 8.0* 7.9*  GFRNONAA 34* 41* 46*  GFRAA 40* 48* 53*  ANIONGAP 7 5 5      Hematology Recent Labs  Lab 10/27/18 1551 10/30/18 2333 10/31/18 0400  WBC 3.8* 6.7 5.9  RBC 2.75* 2.97* 2.87*  HGB 7.9* 8.4* 8.1*  HCT 25.5* 27.8* 26.6*  MCV 92.7 93.6 92.7  MCH 28.7 28.3 28.2  MCHC 31.0 30.2 30.5  RDW 13.9 13.9 13.9  PLT 155  202 189    Cardiac Enzymes Recent Labs  Lab 10/27/18 1551 10/27/18 1916 10/30/18 2333  TROPONINI 0.08* 0.08* 0.03*   No results for input(s): TROPIPOC in the last 168 hours.   BNP Recent Labs  Lab 10/28/18 1323 10/29/18 1439 10/30/18 2333  BNP 1,719.0* 1,936.0* 1,949.0*     DDimer No results for input(s): DDIMER in the last 168 hours.   Radiology    Dg Chest Port 1 View  Result Date: 10/30/2018 CLINICAL DATA:  Dyspnea and shortness of breath. EXAM: PORTABLE CHEST 1 VIEW COMPARISON:  Frontal and lateral views 10/27/2018, 10/12/2018 additional prior exams FINDINGS: Tip of the right upper extremity PICC in the mid SVC. Multi lead left-sided pacemaker remains in place. Mild cardiomegaly which is similar to prior exam. Unchanged mediastinal contours with aortic atherosclerosis. Increased interstitial thickening suspicious for pulmonary edema. Ovoid the right mid lung is likely  fluid in the fissure. Faint adjacent opacity in the right upper lobe, worsened from prior exam. Suspect small pleural effusions. No pneumothorax. IMPRESSION: 1. Interstitial thickening suspicious for pulmonary edema, probable small pleural effusions. 2. Ovoid opacity in the right mid lung is likely loculated fluid in the fissure, however nonspecific. Adjacent patchy opacity in the right upper lobe. This may represent atelectasis or recurrent pneumonia given waxing and waning appearance on consecutive radiographs. Electronically Signed   By: Keith Rake M.D.   On: 10/30/2018 23:45    Cardiac Studies   LHC 10/05/18  Prox Cx to Dist Cx lesion is 80% stenosed.  Prox LAD-1 lesion is 40% stenosed.  Prox LAD-2 lesion is 90% stenosed.  Prox LAD to Mid LAD lesion is 80% stenosed.  Mid LAD to Dist LAD lesion is 70% stenosed.  1st Diag lesion is 90% stenosed.  Ost 1st Diag lesion is 80% stenosed.  Mid RCA-1 lesion is 40% stenosed.  Mid RCA-2 lesion is 40% stenosed. Final Conclusions:  Severe disease of LAD and diagonal branch Patent proximal LAD stent with severe stenosis at the distal edge of the stent Remainder of the LAD is also small, diffusely diseased, diagonal branch diffusely diseased -RCA large vessel dominant with mild disease, left circumflex/OM with mild disease proximal region  Recommendations:  Case discussed with interventional cardiology, Placing a short stent in the distal edge of the LAD stent may provide little clinical benefit with high risk and may delay surgery on her osteomyelitis/toe infection. Troponin elevation likely secondary to demand ischemia in the setting of febrile, tachycardic, hypertensive on arrival in the setting of bacteremia --Would favor medical management at this time and proceeding with removal of her toe.   TEE 10/02/18 - Left ventricle: Systolic function was mildly reduced. The estimated ejection fraction was in the range of 45% to  50%. - Aortic valve: No evidence of vegetation. There was trivial regurgitation. - Mitral valve: There was mild regurgitation. - Left atrium: No evidence of thrombus in the atrial cavity or appendage. - Right atrium: No evidence of thrombus in the atrial cavity or appendage. - Atrial septum: Echo contrast study showed no right-to-left atrial level shunt, at baseline or with provocation. - Pulmonic valve: No evidence of vegetation.  Impressions: - No evidence of valve vegetation or wire associated vegetation.  TTE 09/29/18 - Left ventricle: The cavity size was normal. Systolic function was mildly reduced. The estimated ejection fraction was in the range of 45% to 50%. The study is not technically sufficient to allow evaluation of LV diastolic function. - Left atrium: The atrium was mildly dilated.  Impressions: - Very suboptimal study. No evidence of vegetations. Consider TEE.  Patient Profile     63 y.o. 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 Jannifer Franklin.  Assessment & Plan    1. Acute on chronic systolic and diastolic heart failure: She has had multiple recent admissions/ER visits and appears to have NYHA 4 symptoms.              -good urine output on furosemide 80 mg IV BID. Still very volume overloaded. Continue             -Has baseline CKD, but Cr improving with diuresis. Cr 1.24 today, 1.62 on admission             -continue carvedilol and losartan. Blood pressure above goal, but with active aggressive diuresis will hold on med adjustments for now.             -fluid restriction, low sodium diet             -weight today 290 lbs (131.8 kg). Down from recent visit with Dr. Caryl Comes (305 lbs)  but higher than recent discharge weight of 275 lbs.   -net negative 3.4 L since admission  2. CAD: last LHC 09/2018, diffuse disease. Had NSTEMI thought likely to be demand from MSSA bacteremia and osteomyelitis             -continue aspirin and clopidogrel             -chronic anemia stable to mildly improving with diuresis, no active bleeding. Monitor given DAPT.             -continue rosuvastatin 40 mg             -troponins lower than recent admission, no chest pain, no further ischemic workup at this time.   3. OSA: prescribed CPAP at home, but not using routinely. Does not wear home O2 but wearing at night while hospitalized. Has had issues with mask fitting/ripping off nasal pillows while she sleeps. Will be important to find a way for her to tolerate CPAP at home given her frequent heart failure exacerbations.  TIME SPENT WITH PATIENT: 25 minutes of direct patient care. More than 50% of that time was spent on coordination of care and counseling regarding heart failure education and management.  Buford Dresser, MD, PhD [facility]     For questions or updates, please contact [place] Please consult [email or web address] for contact info under     Signed, Buford Dresser, MD  11/01/2018, 12:13 PM

## 2018-11-01 NOTE — Evaluation (Signed)
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  O2 sats were controlled and pulse was [facility] with sat 95%o 2L O2.  Will be followed acutely as she is much less mobile than before her medical changes, and is today permitted to resume use of her AFO.  The brace is not at hosp but will expect her to be able to use it at rehab.  Focus on recovery of balance and strength to increase her control of standing and gait.     Follow Up Recommendations SNF    Equipment Recommendations  None recommended by PT;Other (comment)    Recommendations for Other Services       Precautions / Restrictions Precautions Precautions: Fall(telemetry) Restrictions Weight Bearing Restrictions: No      Mobility  Bed Mobility Overal bed mobility: Needs Assistance Bed Mobility: Supine to Sit     Supine to sit: Mod assist        Transfers Overall transfer level: Needs assistance Equipment used: Rolling walker (2 wheeled);1 person hand held assist Transfers: Sit to/from Stand Sit to Stand: Mod assist         General transfer comment: cued hand placement j  Ambulation/Gait Ambulation/Gait assistance: Min assist Gait Distance (Feet): 15 Feet(+5) Assistive device: Rolling walker (2 wheeled) Gait Pattern/deviations: Step-through pattern;Decreased stride length;Wide base of support;Decreased weight shift to left Gait velocity: reduced Gait velocity interpretation: <1.8 ft/sec, indicate of risk for recurrent falls General Gait Details: foot drop on L with pt exaggerating clearance of  LLE but cannot use AFO until her L foot fully heals  Stairs            Wheelchair Mobility    Modified Rankin (Stroke Patients Only)       Balance Overall balance assessment: Needs assistance Sitting-balance support: Feet supported;Bilateral upper extremity supported Sitting balance-Leahy Scale: Good   Postural control: Left lateral lean;Posterior lean Standing balance support: Bilateral upper extremity supported;During functional activity Standing balance-Leahy Scale: Poor                               Pertinent Vitals/Pain Pain Assessment: Faces Faces Pain Scale: Hurts a little bit Pain Location: L foot pain Pain Intervention(s): Monitored during session;Repositioned    Home Living Family/patient expects to be discharged to:: Private residence Living Arrangements: Spouse/significant other;Children;Other relatives Available Help at Discharge: Family;Available 24 hours/day Type of Home: House Home Access: Stairs to enter Entrance Stairs-Rails: None Entrance Stairs-Number of Steps: 1 Home Layout: Two level;Able to live on main level with bedroom/bathroom Home Equipment: Gilford Rile - 2 wheels;Bedside commode;Shower seat;Cane - single point;Wheelchair - manual      Prior Function Level of Independence: Independent         Comments: has a husband to assist her     Hand Dominance   Dominant Hand: Right    Extremity/Trunk Assessment   Upper Extremity Assessment Upper Extremity Assessment: Overall WFL for tasks assessed    Lower Extremity Assessment Lower Extremity Assessment: Generalized weakness       Communication  Communication: No difficulties  Cognition Arousal/Alertness: Awake/alert Behavior During Therapy: WFL for tasks assessed/performed Overall Cognitive Status: Within Functional Limits for tasks assessed                                        General Comments      Exercises     Assessment/Plan    PT  Assessment Patient needs continued PT services  PT Problem List Decreased strength;Decreased range of motion;Decreased activity tolerance;Decreased balance;Decreased mobility;Decreased coordination;Decreased knowledge of use of DME;Decreased safety awareness;Obesity;Decreased skin integrity;Pain       PT Treatment Interventions DME instruction;Therapeutic exercise;Gait training;Balance training;Stair training;Neuromuscular re-education;Functional mobility training;Therapeutic activities;Patient/family education;Manual techniques    PT Goals (Current goals can be found in the Care Plan section)  Acute Rehab PT Goals Patient Stated Goal: to go home PT Goal Formulation: With patient Time For Goal Achievement: 11/15/18    Frequency Min 2X/week   Barriers to discharge Inaccessible home environment stairs to enter house    Co-evaluation               AM-PAC PT "6 Clicks" Mobility  Outcome Measure Help needed turning from your back to your side while in a flat bed without using bedrails?: A Little Help needed moving from lying on your back to sitting on the side of a flat bed without using bedrails?: A Little Help needed moving to and from a bed to a chair (including a wheelchair)?: A Little Help needed standing up from a chair using your arms (e.g., wheelchair or bedside chair)?: A Lot Help needed to walk in hospital room?: A Lot   6 Click Score: 13    End of Session Equipment Utilized During Treatment: Gait belt Activity Tolerance: Patient tolerated treatment well Patient left: in chair;with call bell/phone within reach;with chair alarm set;with nursing/sitter in room Nurse Communication: Mobility status PT Visit Diagnosis: Unsteadiness on feet (R26.81);Other abnormalities of gait and mobility (R26.89);Difficulty in walking, not elsewhere classified (R26.2);Pain Pain - Right/Left: Left Pain - part of body: Ankle and joints of foot    Time: 1401-1441 PT Time Calculation  (min) (ACUTE ONLY): 40 min   Charges:   PT Evaluation $PT Eval Moderate Complexity: 1 Mod PT Treatments $Gait Training: 8-22 mins $Therapeutic Exercise: 8-22 mins        Ramond Dial 11/01/2018, 8:48 PM  Mee Hives, PT MS Acute Rehab Dept. Number: [place] [facility] [place]

## 2018-11-02 ENCOUNTER — Other Ambulatory Visit: Payer: Self-pay | Admitting: *Deleted

## 2018-11-02 ENCOUNTER — Ambulatory Visit: Payer: PPO | Admitting: Family

## 2018-11-02 LAB — BASIC METABOLIC PANEL
Anion gap: 7 (ref 5–15)
BUN: 26 mg/dL — ABNORMAL HIGH (ref 8–23)
CHLORIDE: 99 mmol/L (ref 98–111)
CO2: 35 mmol/L — ABNORMAL HIGH (ref 22–32)
Calcium: 8 mg/dL — ABNORMAL LOW (ref 8.9–10.3)
Creatinine, Ser: 1.23 mg/dL — ABNORMAL HIGH (ref 0.44–1.00)
GFR calc Af Amer: 54 mL/min — ABNORMAL LOW (ref >60)
GFR calc non Af Amer: 46 mL/min — ABNORMAL LOW (ref >60)
Glucose, Bld: 128 mg/dL — ABNORMAL HIGH (ref 70–99)
Potassium: 3.4 mmol/L — ABNORMAL LOW (ref 3.5–5.1)
Sodium: 141 mmol/L (ref 135–145)

## 2018-11-02 LAB — GLUCOSE, CAPILLARY
Glucose-Capillary: 137 mg/dL — ABNORMAL HIGH (ref 70–99)
Glucose-Capillary: 219 mg/dL — ABNORMAL HIGH (ref 70–99)
Glucose-Capillary: 208 mg/dL — ABNORMAL HIGH (ref 70–99)
Glucose-Capillary: 171 mg/dL — ABNORMAL HIGH (ref 70–99)

## 2018-11-02 MED ORDER — LOSARTAN POTASSIUM 25 MG PO TABS
25.0000 mg | ORAL_TABLET | Freq: Once | ORAL | Status: AC
  Administered 2018-11-02: 25 mg via ORAL
  Filled 2018-11-02: qty 1

## 2018-11-02 MED ORDER — FUROSEMIDE 10 MG/ML IJ SOLN
40.0000 mg | Freq: Two times a day (BID) | INTRAMUSCULAR | Status: DC
  Administered 2018-11-02 – 2018-11-04 (×4): 40 mg via INTRAVENOUS
  Filled 2018-11-02 (×4): qty 4

## 2018-11-02 MED ORDER — LOSARTAN POTASSIUM 50 MG PO TABS
50.0000 mg | ORAL_TABLET | Freq: Every day | ORAL | Status: DC
  Administered 2018-11-03: 50 mg via ORAL
  Filled 2018-11-02: qty 1

## 2018-11-02 NOTE — Care Management Note (Signed)
Case Management Note  Patient Details  Name: [name] MRN: [id number] Date of Birth: 1956-04-07  Subjective/Objective:      Patient is from home with husband.  Admitted with acute on chronic CHF.  Open to [facility] for [facility] services RN, PT and home antibiotics. Uses oxygen at night.  Currently on  4L [place].  Action/Plan: [place] nurse came out to house because patients PICC line was clotted and patient became SOB.  SHe states she has been weighing daily and taking her prescribed medications.  No issues obtaining medications or with obtaining medical care.  She is current with PCP and HF clinic.  She uses [employer] [facility].  Uses oxygen at night. Currently on  4L [place].  Will notify [facility] when patient discharges.  [facility] referral placed.                Expected Discharge Date:                  Expected Discharge Plan:  [place]  In-House Referral:  [facility]  Discharge planning Services  CM Consult  Post Acute Care Choice:  Resumption of Svcs/PTA Provider Choice offered to:  Patient  DME Arranged:    DME Agency:     HH Arranged:  RN, PT(HF protocol) [place] Agency:  [place]  Status of Service:  Completed, signed off  If discussed at [place] of Stay Meetings, dates discussed:    Additional Comments:  Elza Rafter, RN 11/02/2018, 3:28 PM

## 2018-11-02 NOTE — Progress Notes (Addendum)
[place] [facility] [place] NAME: [name]    MR#:  [id number]  DATE OF BIRTH:  11-Nov-1955  SUBJECTIVE:  CHIEF COMPLAINT:   Chief Complaint  Patient presents with  . Shortness of Breath  Patient seen and evaluated by me today Has decreased shortness of bresth On oxygen via nasal cannula at 2 L No complaints of chest pain  swelling in the lower extremities improving  REVIEW OF SYSTEMS:    ROS  CONSTITUTIONAL: No documented fever. No fatigue, weakness. No weight gain, no weight loss.  EYES: No blurry or double vision.  ENT: No tinnitus. No postnasal drip. No redness of the oropharynx.  RESPIRATORY: No cough, no wheeze, no hemoptysis. Has decreased dyspnea.  CARDIOVASCULAR: No chest pain. No orthopnea. No palpitations. No syncope.  GASTROINTESTINAL: No nausea, no vomiting or diarrhea. No abdominal pain. No melena or hematochezia.  GENITOURINARY: No dysuria or hematuria.  ENDOCRINE: No polyuria or nocturia. No heat or cold intolerance.  HEMATOLOGY: No anemia. No bruising. No bleeding.  INTEGUMENTARY: No rashes. No lesions.  MUSCULOSKELETAL: No arthritis. Has swelling. No gout.  NEUROLOGIC: No numbness, tingling, or ataxia. No seizure-type activity.  PSYCHIATRIC: No anxiety. No insomnia. No ADD.   DRUG ALLERGIES:  No Known Allergies  VITALS:  Blood pressure (!) 144/60, pulse 62, temperature 98.3 F (36.8 C), resp. rate 18, height 5\' 8"  (1.727 m), weight 130.7 kg, SpO2 98 %.  PHYSICAL EXAMINATION:   Physical Exam  GENERAL:  63 y.o.-year-old patient lying in the bed on oxygen via nasal cannula EYES: Pupils equal, round, reactive to light and accommodation. No scleral icterus. Extraocular muscles intact.  HEENT: Head atraumatic, normocephalic. Oropharynx and nasopharynx clear.  NECK:  Supple, no jugular venous distention. No thyroid enlargement, no tenderness.  LUNGS: Decreased breath sounds bilaterally, bibasilar crepitations heard.  No use of accessory muscles of respiration.  CARDIOVASCULAR: S1, S2 normal. No murmurs, rubs, or gallops.  ABDOMEN: Soft, nontender, nondistended. Bowel sounds present. No organomegaly or mass.  EXTREMITIES: No cyanosis, clubbing  Bilateral edema decreasing.    NEUROLOGIC: Cranial nerves II through XII are intact. No focal Motor or sensory deficits b/l.   PSYCHIATRIC: The patient is alert and oriented x 3.  SKIN: No obvious rash, lesion, or ulcer.   LABORATORY PANEL:   CBC Recent Labs  Lab 10/31/18 0400  WBC 5.9  HGB 8.1*  HCT 26.6*  PLT 189   ------------------------------------------------------------------------------------------------------------------ Chemistries  Recent Labs  Lab 11/02/18 0525  NA 141  K 3.4*  CL 99  CO2 35*  GLUCOSE 128*  BUN 26*  CREATININE 1.23*  CALCIUM 8.0*   ------------------------------------------------------------------------------------------------------------------  Cardiac Enzymes Recent Labs  Lab 10/30/18 2333  TROPONINI 0.03*   ------------------------------------------------------------------------------------------------------------------  RADIOLOGY:  No results found.   ASSESSMENT AND PLAN:   63 year old female patient with history of myocardial infarction in the past, systolic heart failure with EF of 35%, cardioverter defibrillator, coronary artery disease, history of osteomyelitis, neuropathy, obesity, hyperlipidemia, type 2 diabetes mellitus, chronic kidney disease currently under hospitalist service for shortness of breath  -Acute on chronic systolic heart failure exacerbation improving slowly status post cardiology evaluation Diurese patient with IV Lasix 40 mg every 12 hourly, will decrease from 82m iv Monitor electrolytes Potassium supplementation Daily body weights and input output chart Patient had a recent echocardiogram 1 month ago which shows EF of 40 to 45% with reduced systolic function  -Coronary  artery disease with history of MI Continue aspirin, Plavix, beta-blocker  -  Hyperlipidemia Continue statin medication  -Dyspnea secondary to heart failure exacerbation Continue oxygen via nasal cannula Once diuresed well will wean off oxygen to room air Patient uses oxygen at nighttime at home via nasal cannula  -Chronic kidney disease stage III Monitor renal function  -Type 2 diabetes mellitus Diabetic diet and sliding scale coverage with insulin  -Osteomyelitis of the toe Continue IV antibiotics via PICC line  -DVT prophylaxis subcu heparin  -Obstructive sleep apnea CPAP at bedtime has been prescribed Has to be more compliant She currently uses nocturnal oxygen via nasal cannula  All the records are reviewed and case discussed with Care Management/Social Worker. Management plans discussed with the patient, family and they are in agreement.  CODE STATUS: Full code  DVT Prophylaxis: SCDs  TOTAL TIME TAKING CARE OF THIS PATIENT: 35 minutes.   POSSIBLE D/C IN 2 to 3 DAYS, DEPENDING ON CLINICAL CONDITION.  Saundra Shelling M.D on 11/02/2018 at 10:47 AM  Between 7am to 6pm - Pager - [phone]  After 6pm go to [email or web address] - password [email or web address] [facility]  Office  [phone]  CC: Primary care physician; Rutherford Guys, MD  Note: This dictation was prepared with Dragon dictation along with smaller phrase technology. Any transcriptional errors that result from this process are unintentional.

## 2018-11-02 NOTE — Clinical Social Work Note (Signed)
CSW received referral for SNF.  Case discussed with case manager and plan is to discharge home with home health.  CSW to sign off please re-consult if social work needs arise.  Macky Galik R. Viraat Vanpatten, MSW, LCSWA [phone]  

## 2018-11-02 NOTE — Progress Notes (Signed)
Progress Note  Patient Name: [name] Date of Encounter: 11/02/2018  Primary Cardiologist: Dr. Fletcher Anon  Subjective   She reports improvement in shortness of breath and edema.  Her weight is down to 288 pounds.  Dry weight seems to be between 280 to 285 pounds.  Inpatient Medications    Scheduled Meds: . aspirin EC  81 mg Oral Daily  . carvedilol  12.5 mg Oral BID WC  . citalopram  20 mg Oral Daily  . clopidogrel  75 mg Oral Daily  . furosemide  40 mg Intravenous BID  . heparin  5,000 Units Subcutaneous Q8H  . insulin aspart  0-5 Units Subcutaneous QHS  . insulin aspart  0-9 Units Subcutaneous TID WC  . losartan  25 mg Oral Once  . [START ON 11/03/2018] losartan  50 mg Oral Daily  . mouth rinse  15 mL Mouth Rinse BID  . potassium chloride  40 mEq Oral Daily  . rosuvastatin  40 mg Oral QHS  . sodium chloride flush  10 mL Intravenous Q12H  . sodium chloride flush  10-40 mL Intracatheter Q12H   Continuous Infusions: . sodium chloride 10 mL/hr at 11/02/18 0649  . ceFAZolin 2 g (11/02/18 0650)   PRN Meds: sodium chloride, acetaminophen **OR** acetaminophen, ondansetron **OR** ondansetron (ZOFRAN) IV, sodium chloride flush   Vital Signs    Vitals:   11/01/18 0344 11/01/18 0835 11/01/18 1933 11/02/18 0318  BP: (!) 156/65 (!) 154/67 (!) 144/64 (!) 144/60  Pulse: 66 66 69 62  Resp: 18  18 18   Temp: 98 F (36.7 C) 98.5 F (36.9 C) 98.4 F (36.9 C) 98.3 F (36.8 C)  TempSrc: Oral Oral Oral   SpO2: 100% 100% 99% 98%  Weight: 131.8 kg   130.7 kg  Height:        Intake/Output Summary (Last 24 hours) at 11/02/2018 0936 Last data filed at 11/02/2018 0424 Gross per 24 hour  Intake 718.81 ml  Output 1500 ml  Net -781.19 ml   Last 3 Weights 11/02/2018 11/01/2018 10/31/2018  Weight (lbs) 288 lb 2.3 oz 290 lb 8 oz 293 lb 8 oz  Weight (kg) 130.7 kg 131.77 kg 133.131 kg  Some encounter information is confidential and restricted. Go to Review Flowsheets activity to see all  data.      Telemetry    Atrial sensed, bi-v paced - Personally Reviewed  ECG    No new since 1/24 - Personally Reviewed  Physical Exam   GEN: No acute distress.   Neck:  Mild JVD around 5 cm. Cardiac: RRR, no murmurs, rubs, or gallops.  Respiratory:  Bibasilar crackles GI: Soft, nontender, non-distended  MS: Mild bilateral leg edema Neuro:  Nonfocal  Psych: Normal affect   Labs    Chemistry Recent Labs  Lab 10/31/18 0400 11/01/18 0837 11/02/18 0525  NA 140 141 141  K 4.3 3.8 3.4*  CL 102 100 99  CO2 33* 36* 35*  GLUCOSE 182* 136* 128*  BUN 34* 27* 26*  CREATININE 1.36* 1.24* 1.23*  CALCIUM 8.0* 7.9* 8.0*  GFRNONAA 41* 46* 46*  GFRAA 48* 53* 54*  ANIONGAP 5 5 7      Hematology Recent Labs  Lab 10/27/18 1551 10/30/18 2333 10/31/18 0400  WBC 3.8* 6.7 5.9  RBC 2.75* 2.97* 2.87*  HGB 7.9* 8.4* 8.1*  HCT 25.5* 27.8* 26.6*  MCV 92.7 93.6 92.7  MCH 28.7 28.3 28.2  MCHC 31.0 30.2 30.5  RDW 13.9 13.9 13.9  PLT 155  202 189    Cardiac Enzymes Recent Labs  Lab 10/27/18 1551 10/27/18 1916 10/30/18 2333  TROPONINI 0.08* 0.08* 0.03*   No results for input(s): TROPIPOC in the last 168 hours.   BNP Recent Labs  Lab 10/28/18 1323 10/29/18 1439 10/30/18 2333  BNP 1,719.0* 1,936.0* 1,949.0*     DDimer No results for input(s): DDIMER in the last 168 hours.   Radiology    No results found.  Cardiac Studies   LHC 10/05/18  Prox Cx to Dist Cx lesion is 80% stenosed.  Prox LAD-1 lesion is 40% stenosed.  Prox LAD-2 lesion is 90% stenosed.  Prox LAD to Mid LAD lesion is 80% stenosed.  Mid LAD to Dist LAD lesion is 70% stenosed.  1st Diag lesion is 90% stenosed.  Ost 1st Diag lesion is 80% stenosed.  Mid RCA-1 lesion is 40% stenosed.  Mid RCA-2 lesion is 40% stenosed. Final Conclusions:  Severe disease of LAD and diagonal branch Patent proximal LAD stent with severe stenosis at the distal edge of the stent Remainder of the LAD is also  small, diffusely diseased, diagonal branch diffusely diseased -RCA large vessel dominant with mild disease, left circumflex/OM with mild disease proximal region  Recommendations:  Case discussed with interventional cardiology, Placing a short stent in the distal edge of the LAD stent may provide little clinical benefit with high risk and may delay surgery on her osteomyelitis/toe infection. Troponin elevation likely secondary to demand ischemia in the setting of febrile, tachycardic, hypertensive on arrival in the setting of bacteremia --Would favor medical management at this time and proceeding with removal of her toe.   TEE 10/02/18 - Left ventricle: Systolic function was mildly reduced. The estimated ejection fraction was in the range of 45% to 50%. - Aortic valve: No evidence of vegetation. There was trivial regurgitation. - Mitral valve: There was mild regurgitation. - Left atrium: No evidence of thrombus in the atrial cavity or appendage. - Right atrium: No evidence of thrombus in the atrial cavity or appendage. - Atrial septum: Echo contrast study showed no right-to-left atrial level shunt, at baseline or with provocation. - Pulmonic valve: No evidence of vegetation.  Impressions: - No evidence of valve vegetation or wire associated vegetation.  TTE 09/29/18 - Left ventricle: The cavity size was normal. Systolic function was mildly reduced. The estimated ejection fraction was in the range of 45% to 50%. The study is not technically sufficient to allow evaluation of LV diastolic function. - Left atrium: The atrium was mildly dilated.  Impressions: - Very suboptimal study. No evidence of vegetations. Consider TEE.  Patient Profile     63 y.o. 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  Assessment & Plan    1. Acute on chronic systolic and diastolic heart failure: She is still volume overloaded but seems to be gradually improving.  Weight is down to 288 pounds and her dry weight appears to be between 280 and 285 pounds.  I recommend continuing IV diuresis likely for another day or 2.  Continue carvedilol.  I elected to increase losartan given elevated blood pressure.  Renal function seems to be stable with diuresis.   2. CAD: last LHC 09/2018, diffuse disease. Had NSTEMI thought likely to be demand from MSSA bacteremia and  osteomyelitis             -continue aspirin and clopidogrel             -chronic anemia stable to mildly improving with diuresis, no active bleeding. Monitor given DAPT.             -continue rosuvastatin 40 mg  3. OSA: prescribed CPAP at home, but not using routinely. Does not wear home O2 but wearing at night while hospitalized.   Will be important to find a way for her to tolerate CPAP at home given her frequent heart failure exacerbations.     For questions or updates, please contact [place] Please consult [email or web address] for contact info under     Signed, [name], MD  11/02/2018, 9:36 AM

## 2018-11-02 NOTE — Telephone Encounter (Signed)
Faxed forms today for pt will follow-up.

## 2018-11-03 ENCOUNTER — Inpatient Hospital Stay: Payer: PPO | Admitting: Infectious Diseases

## 2018-11-03 DIAGNOSIS — N183 Chronic kidney disease, stage 3 (moderate): Secondary | ICD-10-CM

## 2018-11-03 DIAGNOSIS — R0603 Acute respiratory distress: Secondary | ICD-10-CM

## 2018-11-03 DIAGNOSIS — I480 Paroxysmal atrial fibrillation: Secondary | ICD-10-CM

## 2018-11-03 DIAGNOSIS — I10 Essential (primary) hypertension: Secondary | ICD-10-CM

## 2018-11-03 DIAGNOSIS — I509 Heart failure, unspecified: Secondary | ICD-10-CM

## 2018-11-03 LAB — CBC WITH DIFFERENTIAL/PLATELET
Abs Immature Granulocytes: 0.02 10*3/uL (ref 0.00–0.07)
Basophils Absolute: 0 10*3/uL (ref 0.0–0.1)
Basophils Relative: 0 %
Eosinophils Absolute: 0.1 10*3/uL (ref 0.0–0.5)
Eosinophils Relative: 1 %
HCT: 26.7 % — ABNORMAL LOW (ref 36.0–46.0)
Hemoglobin: 8.1 g/dL — ABNORMAL LOW (ref 12.0–15.0)
Immature Granulocytes: 0 %
Lymphocytes Relative: 13 %
Lymphs Abs: 0.8 10*3/uL (ref 0.7–4.0)
MCH: 28.5 pg (ref 26.0–34.0)
MCHC: 30.3 g/dL (ref 30.0–36.0)
MCV: 94 fL (ref 80.0–100.0)
MONOS PCT: 6 %
Monocytes Absolute: 0.4 10*3/uL (ref 0.1–1.0)
Neutro Abs: 4.9 10*3/uL (ref 1.7–7.7)
Neutrophils Relative %: 80 %
Platelets: 167 10*3/uL (ref 150–400)
RBC: 2.84 MIL/uL — ABNORMAL LOW (ref 3.87–5.11)
RDW: 14.3 % (ref 11.5–15.5)
WBC: 6.3 10*3/uL (ref 4.0–10.5)
nRBC: 0 % (ref 0.0–0.2)

## 2018-11-03 LAB — GLUCOSE, CAPILLARY
Glucose-Capillary: 160 mg/dL — ABNORMAL HIGH (ref 70–99)
Glucose-Capillary: 333 mg/dL — ABNORMAL HIGH (ref 70–99)
Glucose-Capillary: 263 mg/dL — ABNORMAL HIGH (ref 70–99)
Glucose-Capillary: 201 mg/dL — ABNORMAL HIGH (ref 70–99)

## 2018-11-03 LAB — BASIC METABOLIC PANEL
Anion gap: 7 (ref 5–15)
BUN: 25 mg/dL — ABNORMAL HIGH (ref 8–23)
CO2: 35 mmol/L — ABNORMAL HIGH (ref 22–32)
Calcium: 7.8 mg/dL — ABNORMAL LOW (ref 8.9–10.3)
Chloride: 95 mmol/L — ABNORMAL LOW (ref 98–111)
Creatinine, Ser: 1.06 mg/dL — ABNORMAL HIGH (ref 0.44–1.00)
GFR calc Af Amer: >60 mL/min (ref >60)
GFR, EST NON AFRICAN AMERICAN: 55 mL/min — AB (ref >60)
Glucose, Bld: 161 mg/dL — ABNORMAL HIGH (ref 70–99)
Potassium: 3.8 mmol/L (ref 3.5–5.1)
SODIUM: 137 mmol/L (ref 135–145)

## 2018-11-03 MED ORDER — LOSARTAN POTASSIUM 50 MG PO TABS
50.0000 mg | ORAL_TABLET | Freq: Once | ORAL | Status: AC
  Administered 2018-11-03: 50 mg via ORAL
  Filled 2018-11-03: qty 1

## 2018-11-03 MED ORDER — LOSARTAN POTASSIUM 50 MG PO TABS
100.0000 mg | ORAL_TABLET | Freq: Every day | ORAL | Status: DC
  Administered 2018-11-04: 100 mg via ORAL
  Filled 2018-11-03: qty 2

## 2018-11-03 MED ORDER — GUAIFENESIN-DM 100-10 MG/5ML PO SYRP
5.0000 mL | ORAL_SOLUTION | ORAL | Status: DC | PRN
  Administered 2018-11-03: 5 mL via ORAL
  Filled 2018-11-03 (×2): qty 5

## 2018-11-03 MED ORDER — ADULT MULTIVITAMIN W/MINERALS CH
1.0000 | ORAL_TABLET | Freq: Every day | ORAL | Status: DC
  Administered 2018-11-03 – 2018-11-04 (×2): 1 via ORAL
  Filled 2018-11-03 (×2): qty 1

## 2018-11-03 NOTE — Progress Notes (Signed)
[place] at [place] NAME: [name]    MR#:  [id number]  DATE OF BIRTH:  [birth year]  SUBJECTIVE:  CHIEF COMPLAINT:   Chief Complaint  Patient presents with  . Shortness of Breath  Patient seen and evaluated by me today Has decreased shortness of breath On oxygen via nasal cannula at 4 L but not on oxygen at home during daytime. No complaints of chest pain  swelling in the lower extremities improving Ambulated in the hallway today Close to 8 LB weight loss after diuresis  REVIEW OF SYSTEMS:    ROS  CONSTITUTIONAL: No documented fever. No fatigue, weakness. No weight gain, no weight loss.  EYES: No blurry or double vision.  ENT: No tinnitus. No postnasal drip. No redness of the oropharynx.  RESPIRATORY: No cough, no wheeze, no hemoptysis. Has decreased dyspnea.  CARDIOVASCULAR: No chest pain. No orthopnea. No palpitations. No syncope.  GASTROINTESTINAL: No nausea, no vomiting or diarrhea. No abdominal pain. No melena or hematochezia.  GENITOURINARY: No dysuria or hematuria.  ENDOCRINE: No polyuria or nocturia. No heat or cold intolerance.  HEMATOLOGY: No anemia. No bruising. No bleeding.  INTEGUMENTARY: No rashes. No lesions.  MUSCULOSKELETAL: No arthritis. Has swelling. No gout.  NEUROLOGIC: No numbness, tingling, or ataxia. No seizure-type activity.  PSYCHIATRIC: No anxiety. No insomnia. No ADD.   DRUG ALLERGIES:  No Known Allergies  VITALS:  Blood pressure (!) 156/70, pulse 66, temperature 98 F (36.7 C), temperature source Oral, resp. rate 19, height 5\' 8"  (1.727 m), weight 130 kg, SpO2 99 %.  PHYSICAL EXAMINATION:   Physical Exam  GENERAL:  63 y.o.-year-old patient lying in the bed on oxygen via nasal cannula EYES: Pupils equal, round, reactive to light and accommodation. No scleral icterus. Extraocular muscles intact.  HEENT: Head atraumatic, normocephalic. Oropharynx and nasopharynx clear.  NECK:  Supple, no  jugular venous distention. No thyroid enlargement, no tenderness.  LUNGS: Decreased breath sounds bilaterally, bibasilar crepitations heard. No use of accessory muscles of respiration.  CARDIOVASCULAR: S1, S2 normal. No murmurs, rubs, or gallops.  ABDOMEN: Soft, nontender, nondistended. Bowel sounds present. No organomegaly or mass.  EXTREMITIES: No cyanosis, clubbing  Bilateral edema decreasing.    NEUROLOGIC: Cranial nerves II through XII are intact. No focal Motor or sensory deficits b/l.   PSYCHIATRIC: The patient is alert and oriented x 3.  SKIN: No obvious rash, lesion, or ulcer.   LABORATORY PANEL:   CBC Recent Labs  Lab 11/03/18 1028  WBC 6.3  HGB 8.1*  HCT 26.7*  PLT 167   ------------------------------------------------------------------------------------------------------------------ Chemistries  Recent Labs  Lab 11/03/18 0643  NA 137  K 3.8  CL 95*  CO2 35*  GLUCOSE 161*  BUN 25*  CREATININE 1.06*  CALCIUM 7.8*   ------------------------------------------------------------------------------------------------------------------  Cardiac Enzymes Recent Labs  Lab 10/30/18 2333  TROPONINI 0.03*   ------------------------------------------------------------------------------------------------------------------  RADIOLOGY:  No results found.   ASSESSMENT AND PLAN:   63 year old female patient with history of myocardial infarction in the past, systolic heart failure with EF of 35%, cardioverter defibrillator, coronary artery disease, history of osteomyelitis, neuropathy, obesity, hyperlipidemia, type 2 diabetes mellitus, chronic kidney disease currently under hospitalist service for shortness of breath  -Acute on chronic systolic heart failure exacerbation improving status post cardiology evaluation Diurese patient with IV Lasix 40 mg every 12 hourly for 1 more day Monitor electrolytes Potassium supplementation Daily body weights and input output  chart Patient had a recent echocardiogram 1 month ago which shows  EF of 40 to 45% with reduced systolic function Goal weight is 280 pounds to 285 pounds Continue beta-blocker and losartan  -Coronary artery disease with history of MI Continue aspirin, Plavix, beta-blocker Elevated troponin secondary to demand ischemia from bacteremia and osteomyelitis Continue Crestor  -Hyperlipidemia Continue statin medication  -Dyspnea secondary to heart failure exacerbation Continue oxygen via nasal cannula Once diuresed well will wean off oxygen to room air Patient uses oxygen at nighttime at home via nasal cannula  -Chronic kidney disease stage III Monitor renal function  -Type 2 diabetes mellitus Diabetic diet and sliding scale coverage with insulin  -Osteomyelitis of the toe Continue IV antibiotics via PICC line  -DVT prophylaxis subcu heparin  -Obstructive sleep apnea CPAP at bedtime has been prescribed Has to be more compliant She currently uses nocturnal oxygen via nasal cannula  All the records are reviewed and case discussed with Care Management/Social Worker. Management plans discussed with the patient, family and they are in agreement.  CODE STATUS: Full code  DVT Prophylaxis: SCDs  TOTAL TIME TAKING CARE OF THIS PATIENT: 34 minutes.   POSSIBLE D/C IN 2 to 3 DAYS, DEPENDING ON CLINICAL CONDITION.  Saundra Shelling M.D on 11/03/2018 at 1:27 PM  Between 7am to 6pm - Pager - [phone]  After 6pm go to [email or web address] - password [email or web address] [facility]  Office  [phone]  CC: Primary care physician; Rutherford Guys, MD  Note: This dictation was prepared with Dragon dictation along with smaller phrase technology. Any transcriptional errors that result from this process are unintentional.

## 2018-11-03 NOTE — Progress Notes (Signed)
Progress Note  Patient Name: [name] Date of Encounter: 11/03/2018  Primary Cardiologist:Dr. Fletcher Anon  Subjective   Weight down to 286.6Lbs   Continued cough, improving shortness of breath Previously had not ambulated, She did walk with physical therapy today was walking on 4 L maintaining saturations 98% Weaned down to 2 L second part of her walk around the hallway Creatinine improving  Inpatient Medications    Scheduled Meds: . aspirin EC  81 mg Oral Daily  . carvedilol  12.5 mg Oral BID WC  . citalopram  20 mg Oral Daily  . clopidogrel  75 mg Oral Daily  . furosemide  40 mg Intravenous BID  . heparin  5,000 Units Subcutaneous Q8H  . insulin aspart  0-5 Units Subcutaneous QHS  . insulin aspart  0-9 Units Subcutaneous TID WC  . losartan  50 mg Oral Daily  . mouth rinse  15 mL Mouth Rinse BID  . potassium chloride  40 mEq Oral Daily  . rosuvastatin  40 mg Oral QHS  . sodium chloride flush  10 mL Intravenous Q12H  . sodium chloride flush  10-40 mL Intracatheter Q12H   Continuous Infusions: . sodium chloride 500 mL (11/03/18 0646)  . ceFAZolin 2 g (11/03/18 0648)   PRN Meds: sodium chloride, acetaminophen **OR** acetaminophen, ondansetron **OR** ondansetron (ZOFRAN) IV, sodium chloride flush   Vital Signs    Vitals:   11/02/18 2325 11/03/18 0342 11/03/18 0407 11/03/18 0743  BP:  (!) 154/56  (!) 156/70  Pulse: 68 64  66  Resp: 18 19    Temp:  97.7 F (36.5 C)  98 F (36.7 C)  TempSrc:  Oral  Oral  SpO2: 95% 98%  99%  Weight:   130 kg   Height:        Intake/Output Summary (Last 24 hours) at 11/03/2018 0944 Last data filed at 11/03/2018 1093 Gross per 24 hour  Intake 389.05 ml  Output 1300 ml  Net -910.95 ml   Filed Weights   11/01/18 0344 11/02/18 0318 11/03/18 0407  Weight: 131.8 kg 130.7 kg 130 kg    Telemetry    nsr - Personally Reviewed  ECG    No new tracings- Personally Reviewed  Physical Exam   Constitutional:  oriented to  person, place, and time. No distress.  Obese HENT:  Head: Grossly normal Eyes:  no discharge. No scleral icterus.  Neck: No JVD, no carotid bruits  Cardiovascular: Regular rate and rhythm, no murmurs appreciated Pulmonary/Chest: Coarse breath sounds bilaterally Abdominal: Soft.  no distension.  no tenderness.  Musculoskeletal: Normal range of motion Neurological:  normal muscle tone. Coordination normal. No atrophy Skin: Skin warm and dry Psychiatric: normal affect, pleasant   Labs    Chemistry Recent Labs  Lab 11/01/18 0837 11/02/18 0525 11/03/18 0643  NA 141 141 137  K 3.8 3.4* 3.8  CL 100 99 95*  CO2 36* 35* 35*  GLUCOSE 136* 128* 161*  BUN 27* 26* 25*  CREATININE 1.24* 1.23* 1.06*  CALCIUM 7.9* 8.0* 7.8*  GFRNONAA 46* 46* 55*  GFRAA 53* 54* >60  ANIONGAP 5 7 7      Hematology Recent Labs  Lab 10/27/18 1551 10/30/18 2333 10/31/18 0400  WBC 3.8* 6.7 5.9  RBC 2.75* 2.97* 2.87*  HGB 7.9* 8.4* 8.1*  HCT 25.5* 27.8* 26.6*  MCV 92.7 93.6 92.7  MCH 28.7 28.3 28.2  MCHC 31.0 30.2 30.5  RDW 13.9 13.9 13.9  PLT 155 202 189  Cardiac Enzymes Recent Labs  Lab 10/27/18 1551 10/27/18 1916 10/30/18 2333  TROPONINI 0.08* 0.08* 0.03*   No results for input(s): TROPIPOC in the last 168 hours.   BNP Recent Labs  Lab 10/28/18 1323 10/29/18 1439 10/30/18 2333  BNP 1,719.0* 1,936.0* 1,949.0*     DDimer No results for input(s): DDIMER in the last 168 hours.   Radiology    No results found.  Cardiac Studies  LHC 10/05/18  Prox Cx to Dist Cx lesion is 80% stenosed.  Prox LAD-1 lesion is 40% stenosed.  Prox LAD-2 lesion is 90% stenosed.  Prox LAD to Mid LAD lesion is 80% stenosed.  Mid LAD to Dist LAD lesion is 70% stenosed.  1st Diag lesion is 90% stenosed.  Ost 1st Diag lesion is 80% stenosed.  Mid RCA-1 lesion is 40% stenosed.  Mid RCA-2 lesion is 40% stenosed. Final Conclusions:  Severe disease of LAD and diagonal branch Patent proximal  LAD stent with severe stenosis at the distal edge of the stent Remainder of the LAD is also small, diffusely diseased, diagonal branch diffusely diseased -RCA large vessel dominant with mild disease, left circumflex/OM with mild disease proximal region  Recommendations:  Case discussed with interventional cardiology, Placing a short stent in the distal edge of the LAD stent may provide little clinical benefit with high risk and may delay surgery on her osteomyelitis/toe infection. Troponin elevation likely secondary to demand ischemia in the setting of febrile, tachycardic, hypertensive on arrival in the setting of bacteremia --Would favor medical management at this time and proceeding with removal of her toe.   TEE 10/02/18 - Left ventricle: Systolic function was mildly reduced. The estimated ejection fraction was in the range of 45% to 50%. - Aortic valve: No evidence of vegetation. There was trivial regurgitation. - Mitral valve: There was mild regurgitation. - Left atrium: No evidence of thrombus in the atrial cavity or appendage. - Right atrium: No evidence of thrombus in the atrial cavity or appendage. - Atrial septum: Echo contrast study showed no right-to-left atrial level shunt, at baseline or with provocation. - Pulmonic valve: No evidence of vegetation.  Impressions: - No evidence of valve vegetation or wire associated vegetation.  TTE 09/29/18 - Left ventricle: The cavity size was normal. Systolic function was mildly reduced. The estimated ejection fraction was in the range of 45% to 50%. The study is not technically sufficient to allow evaluation of LV diastolic function. - Left atrium: The atrium was mildly dilated.  Impressions: - Very suboptimal study. No evidence of vegetations. Consider TEE.  Patient Profile     63 y.o. 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  Assessment & Plan    1.   Acute respiratory distress Acute on chronic systolic and diastolic heart failure:  In addition symptoms exacerbated by anemia, pneumonia Suspect still volume overloaded, dramatic improvement Goal weight 280 up to 285 pounds, current weight 286 pounds --Would encourage 1 more day of IV diuresis  Continue carvedilol ,  Losartan  2. CAD: last LHC 09/2018, diffuse disease.  Had NSTEMI thought likely to be demand from MSSA bacteremia and osteomyelitis --On aspirin, Plavix Also on Crestor 40 daily  3. OSA:  Stressed importance of using her CPAP nightly She does not like to wear this, typically  wears oxygen at night -This could contribute to CHF exacerbation     Total encounter time more than 25 minutes  Greater than 50% was spent in counseling and coordination of care with the patient   For questions or updates, please contact [place] Please consult [email or web address] for contact info under        Signed, Arvil Chaco, PA-C  11/03/2018, 9:44 AM

## 2018-11-03 NOTE — Plan of Care (Signed)
Nutrition Education Note  RD consulted for nutrition education regarding CHF.  63 year old female patient with history of myocardial infarction in the past, systolic heart failure with EF of 35%, cardioverter defibrillator, coronary artery disease, history of osteomyelitis, neuropathy, obesity, hyperlipidemia, type 2 diabetes mellitus, chronic kidney disease currently under hospitalist service for shortness of breath.  Met with pt in room today. RD familiar with this pt from multiple previous admits. Pt reports good appetite and oral intake pta. Pt has continued to take a daily MVI at home. Pt is no longer drinking the protein supplements. Pt's foot incision is healing well; pt likely does not need supplements any longer. Pt reports eating 100% of her breakfast this morning.    RD provided "Low Sodium Nutrition Therapy" handout from the [employer]. Reviewed patient's dietary recall. Provided examples on ways to decrease sodium intake in diet. Discouraged intake of processed foods and use of salt shaker. Encouraged fresh fruits and vegetables as well as whole grain sources of carbohydrates to maximize fiber intake.   RD discussed why it is important for patient to adhere to diet recommendations, and emphasized the role of fluids, foods to avoid, and importance of weighing self daily. Teach back method used.  Expect good compliance.  Body mass index is 43.58 kg/m. Pt meets criteria for morbid obesity based on current BMI.  Current diet order is HH/CHO, patient is consuming approximately 100% of meals at this time. Labs and medications reviewed. No further nutrition interventions warranted at this time. RD contact information provided. If additional nutrition issues arise, please re-consult RD.   Koleen Distance MS, RD, LDN Pager #- [phone] Office#- [phone] After Hours Pager: [phone]

## 2018-11-03 NOTE — Progress Notes (Signed)
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  Stood and was able to ambulate x 1 lap on unit.  4 lpm for first 100' and remained in high 90's.  Decreased to 2 lpm per md request and remained in high 90's.  2 lpm in room and discussed with RN.  Will adjust discharge recommendations.  No LOB or buckling noted.  Pt has all equipment and good support at home.  Agrees with DC home with HHPT.   Follow Up Recommendations  Home health PT     Equipment Recommendations       Recommendations for Other Services       Precautions / Restrictions Precautions Precautions: Fall Restrictions Weight Bearing Restrictions: No    Mobility  Bed Mobility Overal bed mobility: Modified Independent                Transfers Overall transfer level: Modified independent Equipment used: Rolling walker (2 wheeled)             General transfer comment: bed raised  Ambulation/Gait Ambulation/Gait assistance: Min assist Gait Distance (Feet): 200 Feet Assistive device: Rolling walker (2 wheeled) Gait Pattern/deviations: Step-through pattern Gait velocity: reduced   General Gait Details: foot drop on L with pt exaggerating clearance of LLE but cannot use AFO until her L foot fully heals   Stairs             Wheelchair Mobility    Modified Rankin (Stroke Patients Only)       Balance Overall balance assessment: Needs assistance Sitting-balance support: Feet supported;Bilateral upper extremity supported Sitting balance-Leahy Scale: Good     Standing balance support: Bilateral upper extremity supported;During functional  activity Standing balance-Leahy Scale: Fair                              Cognition Arousal/Alertness: Awake/alert Behavior During Therapy: WFL for tasks assessed/performed Overall Cognitive Status: Within Functional Limits for tasks assessed                                        Exercises      General Comments        Pertinent Vitals/Pain Pain Assessment: No/denies pain    Home Living                      Prior Function            PT Goals (current goals can now be found in the care plan section) Progress towards PT goals: Progressing toward goals    Frequency    Min 2X/week      PT Plan      Co-evaluation              AM-PAC PT "6 Clicks" Mobility   Outcome Measure  Help needed turning from your back to your side while in a flat bed without using bedrails?: None Help needed moving from lying on your back to sitting on the side of a  flat bed without using bedrails?: None Help needed moving to and from a bed to a chair (including a wheelchair)?: A Little Help needed standing up from a chair using your arms (e.g., wheelchair or bedside chair)?: A Little Help needed to walk in hospital room?: A Little Help needed climbing 3-5 steps with a railing? : A Little 6 Click Score: 20    End of Session Equipment Utilized During Treatment: Gait belt Activity Tolerance: Patient tolerated treatment well Patient left: in chair;with call bell/phone within reach;with chair alarm set Nurse Communication: Mobility status Pain - Right/Left: Left Pain - part of body: Ankle and joints of foot     Time: 8316-7425 PT Time Calculation (min) (ACUTE ONLY): 18 min  Charges:  $Gait Training: 8-22 mins                     Chesley Noon, PTA 11/03/18, 11:59 AM

## 2018-11-03 NOTE — Progress Notes (Signed)
Inpatient Diabetes Program Recommendations  AACE/ADA: New Consensus Statement on Inpatient Glycemic Control (2015)  Target Ranges:  Prepandial:   less than 140 mg/dL      Peak postprandial:   less than 180 mg/dL (1-2 hours)      Critically ill patients:  140 - 180 mg/dL   Results for TAYAH, IDROVO (MRN [id number]) as of 11/03/2018 14:05  Ref. Range 11/02/2018 07:58 11/02/2018 12:06 11/02/2018 16:40 11/02/2018 20:46  Glucose-Capillary Latest Ref Range: 70 - 99 mg/dL 137 (H)  1 unit NOVOLOG  219 (H)  3 units NOVOLOG  208 (H)  3 units NOVOLOG  171 (H)   Results for ANDE, THERRELL (MRN [id number]) as of 11/03/2018 14:05  Ref. Range 11/03/2018 07:45 11/03/2018 12:53  Glucose-Capillary Latest Ref Range: 70 - 99 mg/dL 160 (H)  2 units NOVOLOG  333 (H)  7 units NOVOLOG    Results for PAMLEA, FINDER (MRN [id number]) as of 11/03/2018 14:05  Ref. Range 10/22/2018 14:19  Hemoglobin A1C Latest Ref Range: 4.8 - 5.6 % 7.8 (H)     Home DM Meds: Amaryl 4 mg BID      Lantus 16 units QHS  Current Orders: Novolog Sensitive Correction Scale/ SSI (0-9 units) TID AC + HS     MD- Please consider the following in-hospital insulin adjustments:  1. Start Lantus 8 units QHS (50% total home dose)  2. Start Novolog Meal Coverage: Novolog 4 units TID with meals  (Please add the following Hold Parameters: Hold if pt eats <50% of meal, Hold if pt NPO)    --Will follow patient during hospitalization--  Wyn Quaker RN, MSN, CDE Diabetes Coordinator Inpatient Glycemic Control Team Team Pager: [phone] (8a-5p)

## 2018-11-03 NOTE — Care Management Important Message (Signed)
Copy of signed Medicare IM left with patient in room. 

## 2018-11-04 DIAGNOSIS — D631 Anemia in chronic kidney disease: Secondary | ICD-10-CM

## 2018-11-04 DIAGNOSIS — E1142 Type 2 diabetes mellitus with diabetic polyneuropathy: Secondary | ICD-10-CM

## 2018-11-04 LAB — GLUCOSE, CAPILLARY
Glucose-Capillary: 181 mg/dL — ABNORMAL HIGH (ref 70–99)
Glucose-Capillary: 256 mg/dL — ABNORMAL HIGH (ref 70–99)

## 2018-11-04 LAB — BASIC METABOLIC PANEL
Anion gap: 7 (ref 5–15)
BUN: 27 mg/dL — ABNORMAL HIGH (ref 8–23)
CO2: 36 mmol/L — ABNORMAL HIGH (ref 22–32)
Calcium: 8 mg/dL — ABNORMAL LOW (ref 8.9–10.3)
Chloride: 95 mmol/L — ABNORMAL LOW (ref 98–111)
Creatinine, Ser: 1.13 mg/dL — ABNORMAL HIGH (ref 0.44–1.00)
GFR calc non Af Amer: 51 mL/min — ABNORMAL LOW (ref >60)
GFR, EST AFRICAN AMERICAN: 59 mL/min — AB (ref >60)
Glucose, Bld: 165 mg/dL — ABNORMAL HIGH (ref 70–99)
Potassium: 3.6 mmol/L (ref 3.5–5.1)
SODIUM: 138 mmol/L (ref 135–145)

## 2018-11-04 MED ORDER — POTASSIUM CHLORIDE CRYS ER 20 MEQ PO TBCR: 40.0000 meq | EXTENDED_RELEASE_TABLET | Freq: Once | ORAL | Status: DC

## 2018-11-04 MED ORDER — LOSARTAN POTASSIUM 50 MG PO TABS: 50.0000 mg | ORAL_TABLET | Freq: Every day | ORAL | 0 refills | Status: DC

## 2018-11-04 MED ORDER — TORSEMIDE 20 MG PO TABS: ORAL_TABLET | ORAL | 2 refills | Status: DC

## 2018-11-04 NOTE — Progress Notes (Signed)
Ambulated one circuit of the nursing station, O2 sats on room air 94-95%.

## 2018-11-04 NOTE — Progress Notes (Signed)
Inpatient Diabetes Program Recommendations  AACE/ADA: New Consensus Statement on Inpatient Glycemic Control  Target Ranges:  Prepandial:   less than 140 mg/dL      Peak postprandial:   less than 180 mg/dL (1-2 hours)      Critically ill patients:  140 - 180 mg/dL   Results for Vickie, [name] (MRN [id number]) as of 11/04/2018 12:53  Ref. Range 11/03/2018 07:45 11/03/2018 12:53 11/03/2018 16:42 11/03/2018 21:02 11/04/2018 07:50 11/04/2018 12:01  Glucose-Capillary Latest Ref Range: 70 - 99 mg/dL 160 (H) 333 (H) 263 (H) 201 (H) 181 (H) 256 (H)   Review of Glycemic Control  Diabetes history: DM2 Outpatient Diabetes medications: Amaryl 4 mg BID, Lantsu 16 units QHS Current orders for Inpatient glycemic control: Novolog 0-9 units TID with meals, Novolog 0-5 units QHS  Inpatient Diabetes Program Recommendations:   Insulin - Meal Coverage: Post prandial glucose is consistently elevated.  Please consider ordering Novolog 4 units TID with meals for meal coverage if patient eats at least 50% of meals.  Thanks, Barnie Alderman, RN, MSN, CDE Diabetes Coordinator Inpatient Diabetes Program [phone] (Team Pager from 8am to 5pm)

## 2018-11-04 NOTE — Discharge Summary (Signed)
[facility] [place] NAME: [name]    MR#:  [id number]  DATE OF BIRTH:  [birth year]  DATE OF ADMISSION:  10/30/2018 ADMITTING PHYSICIAN: Lance Coon, MD  DATE OF DISCHARGE: 11/04/2018  PRIMARY CARE PHYSICIAN: Rutherford Guys, MD   ADMISSION DIAGNOSIS:  Acute on chronic congestive heart failure, unspecified heart failure type ([place]) [I50.9]  DISCHARGE DIAGNOSIS:  Principal Problem:   Acute on chronic systolic CHF (congestive heart failure) (HCC) Active Problems:   Pure hypercholesterolemia   Diabetic peripheral neuropathy associated with type 2 diabetes mellitus ([place])   Essential hypertension, benign   OSA (obstructive sleep apnea)   Chronic renal insufficiency   Paroxysmal atrial fibrillation (HCC)   Acute exacerbation of CHF (congestive heart failure) ([place])   SECONDARY DIAGNOSIS:   Past Medical History:  Diagnosis Date  . Acute myocardial infarction, subendocardial infarction, initial episode of care ([place]) 07/21/2012  . Acute osteomyelitis involving ankle and foot ([place]) 02/06/2015  . Acute systolic heart failure ([place]) 07/21/2012   New onset 07/19/12; admission to [facility] ED. Elevated Troponins.  S/p 2D-echo with EF 20-25%.  S/p cardiac catheterization with stenting LAD.  Repeat 2D-echo 10/2011 with improved EF of 35%.   . Anemia   . Automatic implantable cardioverter-defibrillator in situ    a. MDT CRT-D 06/2014, SN: [id number]    . CAD (coronary artery disease)    a. cardiac cath 101/04/2012: PCI/DES to chronically occluded mLAD, consideration PCI to diag branch in 4 weeks.   . Cataract   . Chicken pox   . Chronic kidney disease   . Chronic systolic CHF (congestive heart failure) (HCC)    a. mixed ICM & NICM; b. EF 20-25% by echo 07/2012, mid-dist 2/3 of LV sev HK/AK, mild MR. echo 10/2012: EF 30-35%, sev HK ant-septal & inf walls, GR1DD, mild MR, PASP 33. c. echo 02/2013: EF 30%, GR1DD, mild MR. echo 04/2014: EF 30%, Septal-lat  dyssynchrony, global HK, inf AK, GR1DD, mild MR. d. echo 10/2014: EF50-55%, WM nl, GR1DD, septal mild paradox. e. echo 02/2015: EF 50-55%, wm   . Depression   . Heart attack ([place])   . Heel ulcer ([place]) 04/27/2015  . History of blood transfusion ~ 2011   "plasma; had neuropathy; couldn't walk"  . Hypertension   . LBBB (left bundle branch block)   . Neuromuscular disorder ([place])   . Neuropathy 2011  . Obesity, unspecified   . OSA on CPAP    Moderate with AHI 23/hr and now on CPAP at 16cm H2O.  Her DME is [facility]  . Pure hypercholesterolemia   . Type II diabetes mellitus ([place])   . Unspecified vitamin D deficiency      ADMITTING HISTORY [name]  is a 63 y.o. female who presents with chief complaint as above.  Patient presents with shortness of breath.  She has a history of heart failure and states that she has been to the ED even just earlier this week with increased shortness of breath which required a dose of Lasix.  She is gotten Lasix 2 other times this week per her report.  She states she was feeling better yesterday morning, but then in the evening became acutely short of breath again.  She has had significant lower extremity edema which has been fairly persistent throughout the week.  Here in the ED her work-up is consistent with acute on chronic heart failure.  She has significant fluid in her lungs on chest x-ray, and  she was given a dose of IV Lasix in the ED with good urine output and some improvement in her breathing.  Hospitalist were called for admission  HOSPITAL COURSE:  Patient was admitted to telemetry.  Patient was aggressively diuresed with IV Lasix 80 mg twice daily.  Was seen by cardiology during the hospitalization.  Patient continued IV antibiotic via PICC line for osteomyelitis of the foot.  She has 1 more week of antibiotics to go.  Her swelling in the lower extremities improved.  Shortness of breath improved.  She was weaned off oxygen.  Advised patient use CPAP at  bedtime.  She will be discharged home on oral torsemide 40 mg twice daily alternating with 40 mg daily.  Overall patient lost 9 pounds secondary to diuresis.  Patient was worked up with TEE echocardiogram and echocardiogram in December 2019 . She will be discharged home with home health services.  Physical therapy evaluated the patient recommended SNF but she wanted to be discharged home.  CONSULTS OBTAINED:  Treatment Team:  Wellington Hampshire, MD  DRUG ALLERGIES:  No Known Allergies  DISCHARGE MEDICATIONS:   Allergies as of 11/04/2018   No Known Allergies     Medication List    TAKE these medications   AMBULATORY NON FORMULARY MEDICATION 1 Units by Other route once. Medication Name: foot brace   aspirin 81 MG EC tablet Take 1 tablet (81 mg total) by mouth daily.   CALCIUM 600 + D PO Take 1 tablet by mouth 2 (two) times daily.   carvedilol 12.5 MG tablet Commonly known as:  COREG Take 1 tablet (12.5 mg total) by mouth 2 (two) times daily with a meal.   ceFAZolin  IVPB Commonly known as:  ANCEF Inject 2 g into the vein every 8 (eight) hours. Indication: MSSA bacteremia and toe infection Last Day of Therapy:  11/10/2018 Labs - Once weekly:  CBC/D and BMP, Labs - Every other week:  ESR and CRP Notes to patient:  DOSE GIVEN AT 1:30 PM TODAY   citalopram 20 MG tablet Commonly known as:  CELEXA Take 1 tablet (20 mg total) by mouth daily.   clopidogrel 75 MG tablet Commonly known as:  PLAVIX Take 1 tablet (75 mg total) by mouth daily.   ferrous sulfate 325 (65 FE) MG tablet Take 325 mg by mouth daily.   glimepiride 4 MG tablet Commonly known as:  AMARYL Take 1 tablet (4 mg total) by mouth 2 (two) times daily.   glucose blood test strip Commonly known as:  ONE TOUCH ULTRA TEST Check sugar three times daily dx: DMII uncontrolled insulin dependent with complications   insulin glargine 100 UNIT/ML injection Commonly known as:  LANTUS Inject 0.16 mLs (16 Units total) into  the skin at bedtime.   Insulin Pen Needle 31G X 8 MM Misc Commonly known as:  B-D ULTRAFINE III SHORT PEN USE AS DIRECTED WITH LANTUS; dx: DMII uncontrolled, insulin dependent, with complications   losartan 50 MG tablet Commonly known as:  COZAAR Take 1 tablet (50 mg total) by mouth daily for 30 days. What changed:    medication strength  how much to take   nitroGLYCERIN 0.4 MG SL tablet Commonly known as:  NITROSTAT Place 1 tablet (0.4 mg total) under the tongue every 5 (five) minutes as needed for chest pain.   omega-3 acid ethyl esters 1 g capsule Commonly known as:  LOVAZA Take 1 g by mouth 2 (two) times daily.   rosuvastatin 40 MG  tablet Commonly known as:  CRESTOR TAKE 1 TABLET BY MOUTH ONCE DAILY What changed:  when to take this   sodium chloride flush 0.9 % Soln Commonly known as:  NS 20 mLs by Intracatheter route every 8 (eight) hours.   torsemide 20 MG tablet Commonly known as:  DEMADEX Take 40 mg orally twice daily alternating with 40 mg once daily What changed:  additional instructions       Today  Patient seen and evaluated today No shortness of breath Weaned off oxygen Comfortably breathing Hemodynamically stable Will be discharged home  VITAL SIGNS:  Blood pressure (!) 157/67, pulse 70, temperature 98.4 F (36.9 C), temperature source Oral, resp. rate 17, height _0  (1.727 m), weight 128.6 kg, SpO2 97 %.  I/O:    Intake/Output Summary (Last 24 hours) at 11/04/2018 1411 Last data filed at 11/04/2018 0445 Gross per 24 hour  Intake -  Output 550 ml  Net -550 ml    PHYSICAL EXAMINATION:  Physical Exam  GENERAL:  63 y.o.-year-old patient lying in the bed with no acute distress.  LUNGS: Normal breath sounds bilaterally, no wheezing, rales,rhonchi or crepitation. No use of accessory muscles of respiration.  CARDIOVASCULAR: S1, S2 normal. No murmurs, rubs, or gallops.  ABDOMEN: Soft, non-tender, non-distended. Bowel sounds present. No  organomegaly or mass.  NEUROLOGIC: Moves all 4 extremities. PSYCHIATRIC: The patient is alert and oriented x 3.  SKIN: No obvious rash, lesion, or ulcer.   DATA REVIEW:   CBC Recent Labs  Lab 11/03/18 1028  WBC 6.3  HGB 8.1*  HCT 26.7*  PLT 167    Chemistries  Recent Labs  Lab 11/04/18 0430  NA 138  K 3.6  CL 95*  CO2 36*  GLUCOSE 165*  BUN 27*  CREATININE 1.13*  CALCIUM 8.0*    Cardiac Enzymes Recent Labs  Lab 10/30/18 2333  TROPONINI 0.03*    Microbiology Results  Results for orders placed or performed during the hospital encounter of 10/12/18  Culture, blood (routine x 2) Call MD if unable to obtain prior to antibiotics being given     Status: None   Collection Time: 10/13/18  1:41 AM  Result Value Ref Range Status   Specimen Description BLOOD LEFT HAND  Final   Special Requests   Final    BOTTLES DRAWN AEROBIC AND ANAEROBIC Blood Culture results may not be optimal due to an excessive volume of blood received in culture bottles   Culture   Final    NO GROWTH 5 DAYS Performed at [facility], [address] [place], [place] [address]    Report Status 10/18/2018 FINAL  Final  Culture, blood (routine x 2) Call MD if unable to obtain prior to antibiotics being given     Status: None   Collection Time: 10/13/18  1:50 AM  Result Value Ref Range Status   Specimen Description BLOOD RIGHT HAND  Final   Special Requests   Final    BOTTLES DRAWN AEROBIC AND ANAEROBIC Blood Culture adequate volume   Culture   Final    NO GROWTH 5 DAYS Performed at [facility], [address] [place], [place] [address]    Report Status 10/18/2018 FINAL  Final    RADIOLOGY:  No results found.  Follow up with PCP in 1 week.  Management plans discussed with the patient, family and they are in agreement.  CODE STATUS: Full code    Code Status Orders  (From admission, onward)  Start     Ordered   10/31/18 0231  Full code  Continuous      10/31/18 0232        Code Status History    Date Active Date Inactive Code Status Order ID Comments User Context   10/13/2018 0047 10/16/2018 1900 DNR [id number]  Gorden Harms, MD Inpatient   09/28/2018 1456 10/09/2018 1726 Full Code [id number]  Nicholes Mango, MD ED   11/11/2015 1411 11/12/2015 1635 Full Code [id number]  Vaughan Basta, MD Inpatient   06/17/2015 0148 06/19/2015 1659 Full Code [id number]  Harrie Foreman, MD Inpatient   02/17/2015 1330 02/24/2015 1925 DNR [id number]  Bettey Costa, MD ED   07/06/2014 1100 07/07/2014 1524 Full Code [id number]  Deboraha Sprang, MD Inpatient   07/22/2012 1427 07/26/2012 1507 DNR [id number]  Nolon Rod, DO Inpatient    Advance Directive Documentation     Most Recent Value  Type of Advance Directive  Living will  Pre-existing out of facility DNR order (yellow form or pink MOST form)  -  "MOST" Form in Place?  -      TOTAL TIME TAKING CARE OF THIS PATIENT ON DAY OF DISCHARGE: more than 35 minutes.   Saundra Shelling M.D on 11/04/2018 at 2:11 PM  Between 7am to 6pm - Pager - [phone]  After 6pm go to [email or web address] - password [email or web address] [facility]  Office  [phone]  CC: Primary care physician; Rutherford Guys, MD  Note: This dictation was prepared with Dragon dictation along with smaller phrase technology. Any transcriptional errors that result from this process are unintentional.

## 2018-11-04 NOTE — Progress Notes (Signed)
Discharged home with her husband.  Prescription printed and given to her. Follow up appointments made with PCP, Heart Failure Clinic, and Cardiology.  She has been admitted several times in the last 4 - 6 weeks.  Instructed her to call Cardiologist is she gains 2 pounds from her baseline weight.

## 2018-11-04 NOTE — Progress Notes (Signed)
Progress Note  Patient Name: [name] Date of Encounter: 11/04/2018  Primary Cardiologist:Dr. Fletcher Anon  Subjective   Dramatic improvement in weight Ambulated yesterday maintaining saturations in the 90s Still on 2 L nasal cannula in the room Shortness of breath improving, cough improving Feels like she is getting closer to where she can go home Weight down to 282 pounds  Inpatient Medications    Scheduled Meds: . aspirin EC  81 mg Oral Daily  . carvedilol  12.5 mg Oral BID WC  . citalopram  20 mg Oral Daily  . clopidogrel  75 mg Oral Daily  . furosemide  40 mg Intravenous BID  . heparin  5,000 Units Subcutaneous Q8H  . insulin aspart  0-5 Units Subcutaneous QHS  . insulin aspart  0-9 Units Subcutaneous TID WC  . losartan  100 mg Oral Daily  . mouth rinse  15 mL Mouth Rinse BID  . multivitamin with minerals  1 tablet Oral Daily  . potassium chloride  40 mEq Oral Once  . rosuvastatin  40 mg Oral QHS  . sodium chloride flush  10 mL Intravenous Q12H  . sodium chloride flush  10-40 mL Intracatheter Q12H   Continuous Infusions: . sodium chloride 500 mL (11/03/18 0646)  . ceFAZolin 2 g (11/04/18 0433)   PRN Meds: sodium chloride, acetaminophen **OR** acetaminophen, guaiFENesin-dextromethorphan, ondansetron **OR** ondansetron (ZOFRAN) IV, sodium chloride flush   Vital Signs    Vitals:   11/03/18 1727 11/03/18 2026 11/04/18 0445 11/04/18 0752  BP: (!) 147/59 (!) 148/66 (!) 147/59 (!) 157/67  Pulse: 74 73 69 70  Resp:  18 16 17   Temp:  98.4 F (36.9 C) 98.5 F (36.9 C) 98.4 F (36.9 C)  TempSrc:  Oral Oral Oral  SpO2: 96% 97% 96% 97%  Weight:   128.6 kg   Height:        Intake/Output Summary (Last 24 hours) at 11/04/2018 1137 Last data filed at 11/04/2018 0445 Gross per 24 hour  Intake -  Output 550 ml  Net -550 ml   Filed Weights   11/02/18 0318 11/03/18 0407 11/04/18 0445  Weight: 130.7 kg 130 kg 128.6 kg    Telemetry    nsr - Personally  Reviewed  ECG    No new tracings- Personally Reviewed  Physical Exam   On examination : On 2 L nasal cannula oxygen alert oriented,  JVD 8+, lungs clear to auscultation bilaterally, heart sounds regular normal S1-S2 no murmurs appreciated, abdomen soft nontender no significant lower extremity edema.  Musculoskeletal exam with good range of motion, neurologic exam grossly nonfocal   Labs    Chemistry Recent Labs  Lab 11/02/18 0525 11/03/18 0643 11/04/18 0430  NA 141 137 138  K 3.4* 3.8 3.6  CL 99 95* 95*  CO2 35* 35* 36*  GLUCOSE 128* 161* 165*  BUN 26* 25* 27*  CREATININE 1.23* 1.06* 1.13*  CALCIUM 8.0* 7.8* 8.0*  GFRNONAA 46* 55* 51*  GFRAA 54* >60 59*  ANIONGAP 7 7 7      Hematology Recent Labs  Lab 10/30/18 2333 10/31/18 0400 11/03/18 1028  WBC 6.7 5.9 6.3  RBC 2.97* 2.87* 2.84*  HGB 8.4* 8.1* 8.1*  HCT 27.8* 26.6* 26.7*  MCV 93.6 92.7 94.0  MCH 28.3 28.2 28.5  MCHC 30.2 30.5 30.3  RDW 13.9 13.9 14.3  PLT 202 189 167    Cardiac Enzymes Recent Labs  Lab 10/30/18 2333  TROPONINI 0.03*   No results for input(s): TROPIPOC  in the last 168 hours.   BNP Recent Labs  Lab 10/28/18 1323 10/29/18 1439 10/30/18 2333  BNP 1,719.0* 1,936.0* 1,949.0*     DDimer No results for input(s): DDIMER in the last 168 hours.   Radiology    No results found.  Cardiac Studies  LHC 10/05/18  Prox Cx to Dist Cx lesion is 80% stenosed.  Prox LAD-1 lesion is 40% stenosed.  Prox LAD-2 lesion is 90% stenosed.  Prox LAD to Mid LAD lesion is 80% stenosed.  Mid LAD to Dist LAD lesion is 70% stenosed.  1st Diag lesion is 90% stenosed.  Ost 1st Diag lesion is 80% stenosed.  Mid RCA-1 lesion is 40% stenosed.  Mid RCA-2 lesion is 40% stenosed. Final Conclusions:  Severe disease of LAD and diagonal branch Patent proximal LAD stent with severe stenosis at the distal edge of the stent Remainder of the LAD is also small, diffusely diseased, diagonal branch  diffusely diseased -RCA large vessel dominant with mild disease, left circumflex/OM with mild disease proximal region  Recommendations:  Case discussed with interventional cardiology, Placing a short stent in the distal edge of the LAD stent may provide little clinical benefit with high risk and may delay surgery on her osteomyelitis/toe infection. Troponin elevation likely secondary to demand ischemia in the setting of febrile, tachycardic, hypertensive on arrival in the setting of bacteremia --Would favor medical management at this time and proceeding with removal of her toe.   TEE 10/02/18 - Left ventricle: Systolic function was mildly reduced. The estimated ejection fraction was in the range of 45% to 50%. - Aortic valve: No evidence of vegetation. There was trivial regurgitation. - Mitral valve: There was mild regurgitation. - Left atrium: No evidence of thrombus in the atrial cavity or appendage. - Right atrium: No evidence of thrombus in the atrial cavity or appendage. - Atrial septum: Echo contrast study showed no right-to-left atrial level shunt, at baseline or with provocation. - Pulmonic valve: No evidence of vegetation.  Impressions: - No evidence of valve vegetation or wire associated vegetation.  TTE 09/29/18 - Left ventricle: The cavity size was normal. Systolic function was mildly reduced. The estimated ejection fraction was in the range of 45% to 50%. The study is not technically sufficient to allow evaluation of LV diastolic function. - Left atrium: The atrium was mildly dilated.  Impressions: - Very suboptimal study. No evidence of vegetations. Consider TEE.  Patient Profile     63 y.o. 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 s/p CRT-D 2015 with improvement in EF, type II diabetes, hypertension, obesity, OSA prescribed CPAP, recent NSTEMI and MSSA bacteremiawho is being seen for acute on  chronic systolic and diastolic heart failure.  Assessment & Plan    1.   Acute respiratory distress Multifactorial including acute on chronic systolic and diastolic CHF with anemia, pneumonia -Weight markedly elevated as outpatient and failed oral torsemide Weight now down to her baseline 182 pounds  Continue carvedilol ,  Losartan As outpatient would take torsemide 40 twice daily alternating with 40 daily -Needs close follow-up in CHF clinic and our clinic  2. CAD: last LHC 09/2018, diffuse disease.  Had NSTEMI thought likely to be demand from MSSA bacteremia and osteomyelitis --On aspirin, Plavix,  Crestor 40 daily No further ischemic work-up at this time  3. OSA:  Stressed importance of using her CPAP nightly She does not like to wear this, typically wears oxygen at night -This could contribute to CHF  exacerbation Again will discuss with her as outpatient  4. Anemia Recommend we refer to hematology for further consultation Contributing to her shortness of breath and heart failure symptoms      Total encounter time more than 25 minutes  Greater than 50% was spent in counseling and coordination of care with the patient  For questions or updates, please contact [facility] Please consult [email or web address] for contact info under        Signed, Ida Rogue, MD  11/04/2018, 11:37 AM

## 2018-11-05 DIAGNOSIS — Z89421 Acquired absence of other right toe(s): Secondary | ICD-10-CM | POA: Diagnosis not present

## 2018-11-05 DIAGNOSIS — I252 Old myocardial infarction: Secondary | ICD-10-CM | POA: Diagnosis not present

## 2018-11-05 DIAGNOSIS — E559 Vitamin D deficiency, unspecified: Secondary | ICD-10-CM | POA: Diagnosis not present

## 2018-11-05 DIAGNOSIS — N189 Chronic kidney disease, unspecified: Secondary | ICD-10-CM | POA: Diagnosis not present

## 2018-11-05 DIAGNOSIS — A4101 Sepsis due to Methicillin susceptible Staphylococcus aureus: Secondary | ICD-10-CM | POA: Diagnosis not present

## 2018-11-05 DIAGNOSIS — G4733 Obstructive sleep apnea (adult) (pediatric): Secondary | ICD-10-CM | POA: Diagnosis not present

## 2018-11-05 DIAGNOSIS — Z4781 Encounter for orthopedic aftercare following surgical amputation: Secondary | ICD-10-CM | POA: Diagnosis not present

## 2018-11-05 DIAGNOSIS — Z89422 Acquired absence of other left toe(s): Secondary | ICD-10-CM | POA: Diagnosis not present

## 2018-11-05 DIAGNOSIS — I255 Ischemic cardiomyopathy: Secondary | ICD-10-CM | POA: Diagnosis not present

## 2018-11-05 DIAGNOSIS — Z8631 Personal history of diabetic foot ulcer: Secondary | ICD-10-CM | POA: Diagnosis not present

## 2018-11-05 DIAGNOSIS — E1140 Type 2 diabetes mellitus with diabetic neuropathy, unspecified: Secondary | ICD-10-CM | POA: Diagnosis not present

## 2018-11-05 DIAGNOSIS — F329 Major depressive disorder, single episode, unspecified: Secondary | ICD-10-CM | POA: Diagnosis not present

## 2018-11-05 DIAGNOSIS — I130 Hypertensive heart and chronic kidney disease with heart failure and stage 1 through stage 4 chronic kidney disease, or unspecified chronic kidney disease: Secondary | ICD-10-CM | POA: Diagnosis not present

## 2018-11-05 DIAGNOSIS — I5022 Chronic systolic (congestive) heart failure: Secondary | ICD-10-CM | POA: Diagnosis not present

## 2018-11-05 DIAGNOSIS — E1122 Type 2 diabetes mellitus with diabetic chronic kidney disease: Secondary | ICD-10-CM | POA: Diagnosis not present

## 2018-11-05 DIAGNOSIS — Z95810 Presence of automatic (implantable) cardiac defibrillator: Secondary | ICD-10-CM | POA: Diagnosis not present

## 2018-11-05 DIAGNOSIS — B9561 Methicillin susceptible Staphylococcus aureus infection as the cause of diseases classified elsewhere: Secondary | ICD-10-CM | POA: Diagnosis not present

## 2018-11-05 DIAGNOSIS — I2510 Atherosclerotic heart disease of native coronary artery without angina pectoris: Secondary | ICD-10-CM | POA: Diagnosis not present

## 2018-11-05 DIAGNOSIS — Z955 Presence of coronary angioplasty implant and graft: Secondary | ICD-10-CM | POA: Diagnosis not present

## 2018-11-05 DIAGNOSIS — Z95820 Peripheral vascular angioplasty status with implants and grafts: Secondary | ICD-10-CM | POA: Diagnosis not present

## 2018-11-05 DIAGNOSIS — M86172 Other acute osteomyelitis, left ankle and foot: Secondary | ICD-10-CM | POA: Diagnosis not present

## 2018-11-05 DIAGNOSIS — I447 Left bundle-branch block, unspecified: Secondary | ICD-10-CM | POA: Diagnosis not present

## 2018-11-05 DIAGNOSIS — E1169 Type 2 diabetes mellitus with other specified complication: Secondary | ICD-10-CM | POA: Diagnosis not present

## 2018-11-05 DIAGNOSIS — Z452 Encounter for adjustment and management of vascular access device: Secondary | ICD-10-CM | POA: Diagnosis not present

## 2018-11-06 ENCOUNTER — Other Ambulatory Visit: Payer: Self-pay | Admitting: *Deleted

## 2018-11-06 NOTE — Patient Outreach (Signed)
[facility] High Risk Patient, transition of care call. Pt discharged on Wednesday, 2 days ago.  Transition of care template completed. Significant findings:   [facility] to provide [facility] and they visited the pt yesterday.  Pt continues to receive an IV antibiotic via PICC line. Her husband learned how to do this and is providing this   Administration.  Medication reconciliation - pt did not know she was to change her torsemide dose to 40 mg bid every other day   and 40 mg every other day. Advised that pt should take this accordingly.  Pt will see primary care provider, Dr. Pamella Pert on Monday.  Pt will have in home evaluation with [facility] next week.   Advised I will call pt weekly for transition of care calls for the next 30 days, unless she does decide to participate in the [facility] - and appt was verified, scheduled for Monday. They will call me if pt accepts their services. At that time, I would close pt case since she would have their service.  Patient was recently discharged from hospital and all medications have been reviewed. Outpatient Encounter Medications as of 11/06/2018  Medication Sig  . AMBULATORY NON FORMULARY MEDICATION 1 Units by Other route once. Medication Name: foot brace  . aspirin EC 81 MG EC tablet Take 1 tablet (81 mg total) by mouth daily.  . Calcium Carb-Cholecalciferol (CALCIUM 600 + D PO) Take 1 tablet by mouth 2 (two) times daily.  . carvedilol (COREG) 12.5 MG tablet Take 1 tablet (12.5 mg total) by mouth 2 (two) times daily with a meal.  . ceFAZolin (ANCEF) IVPB Inject 2 g into the vein every 8 (eight) hours. Indication: MSSA bacteremia and toe infection Last Day of Therapy:  11/10/2018 Labs - Once weekly:  CBC/D and BMP, Labs - Every other week:  ESR and CRP  . citalopram (CELEXA) 20 MG tablet Take 1 tablet (20 mg total) by mouth daily.  . clopidogrel (PLAVIX) 75 MG tablet  Take 1 tablet (75 mg total) by mouth daily.  . ferrous sulfate 325 (65 FE) MG tablet Take 325 mg by mouth daily.  Marland Kitchen glimepiride (AMARYL) 4 MG tablet Take 1 tablet (4 mg total) by mouth 2 (two) times daily.  Marland Kitchen glucose blood (ONE TOUCH ULTRA TEST) test strip Check sugar three times daily dx: DMII uncontrolled insulin dependent with complications  . insulin glargine (LANTUS) 100 UNIT/ML injection Inject 0.16 mLs (16 Units total) into the skin at bedtime.  . Insulin Pen Needle (B-D ULTRAFINE III SHORT PEN) 31G X 8 MM MISC USE AS DIRECTED WITH LANTUS; dx: DMII uncontrolled, insulin dependent, with complications  . losartan (COZAAR) 50 MG tablet Take 1 tablet (50 mg total) by mouth daily for 30 days.  . nitroGLYCERIN (NITROSTAT) 0.4 MG SL tablet Place 1 tablet (0.4 mg total) under the tongue every 5 (five) minutes as needed for chest pain.  Marland Kitchen omega-3 acid ethyl esters (LOVAZA) 1 g capsule Take 1 g by mouth 2 (two) times daily.  . rosuvastatin (CRESTOR) 40 MG tablet TAKE 1 TABLET BY MOUTH ONCE DAILY (Patient taking differently: Take 40 mg by mouth at bedtime. )  . sodium chloride flush (NS) 0.9 % SOLN 20 mLs by Intracatheter route every 8 (eight) hours.  . torsemide (DEMADEX) 20 MG tablet Take 40 mg orally twice daily alternating with 40 mg once daily   No facility-administered encounter medications on file as  of 11/06/2018.    [facility] CM Care Plan Problem One     Most Recent Value  Care Plan Problem One  HF with multiple hospitalizations and ED visits.  Role Documenting the Problem One  Care Management [place] for Problem One  Active  [facility] Long Term Goal   Pt will call MD or NP with #2 wt gain, increased SOB and iin YELLOW ZONE per HF action plan prior to going to ED over the next 31 days.Margie Billet Long Term Goal Start Date  11/06/18  Interventions for Problem One Long Term Goal  Verified that pt has her medications, reconcilled meds, found that pt did not realize her diuretic instructions are  different that prior regimen. Advised pt of new regimen. Advised availablility of NP for calls.  TH[facility] CM Short Term Goal #1   Pt will participate in weekly transition of care goals as evidenced by documentation.  [facility] CM Short Term Goal #1 Start Date  11/06/18  Interventions for Short Term Goal #1  Advised pt of transition of care scheduled weekly calls and she agreed to this plan.    [facility] CM Care Plan Problem Two     Most Recent Value  Care Plan Problem Two  Pt to attend her primary care appt on Monday, February 3rd.  Role Documenting the Problem Two  Care Management Coordinator       [name]. [name], MSN, [facility] Gerontological Nurse Practitioner [facility] Care Management [phone]

## 2018-11-07 HISTORY — PX: PACEMAKER REMOVAL: SHX5066

## 2018-11-09 ENCOUNTER — Encounter: Payer: Self-pay | Admitting: Family Medicine

## 2018-11-09 ENCOUNTER — Other Ambulatory Visit: Payer: Self-pay

## 2018-11-09 ENCOUNTER — Telehealth: Payer: Self-pay | Admitting: Cardiovascular Disease

## 2018-11-09 ENCOUNTER — Ambulatory Visit (INDEPENDENT_AMBULATORY_CARE_PROVIDER_SITE_OTHER): Payer: PPO | Admitting: Family Medicine

## 2018-11-09 ENCOUNTER — Other Ambulatory Visit: Payer: PPO | Admitting: Student

## 2018-11-09 ENCOUNTER — Inpatient Hospital Stay: Payer: PPO | Admitting: Family Medicine

## 2018-11-09 ENCOUNTER — Telehealth: Payer: Self-pay | Admitting: Family Medicine

## 2018-11-09 VITALS — BP 107/60 | HR 73 | Temp 99.3°F | Ht 68.0 in | Wt 293.8 lb

## 2018-11-09 DIAGNOSIS — Z89422 Acquired absence of other left toe(s): Secondary | ICD-10-CM | POA: Diagnosis not present

## 2018-11-09 DIAGNOSIS — Z9189 Other specified personal risk factors, not elsewhere classified: Secondary | ICD-10-CM

## 2018-11-09 DIAGNOSIS — I5022 Chronic systolic (congestive) heart failure: Secondary | ICD-10-CM

## 2018-11-09 DIAGNOSIS — Z515 Encounter for palliative care: Secondary | ICD-10-CM | POA: Diagnosis not present

## 2018-11-09 DIAGNOSIS — N183 Chronic kidney disease, stage 3 unspecified: Secondary | ICD-10-CM

## 2018-11-09 NOTE — Patient Instructions (Signed)
° ° ° °  If you have lab work done today you will be contacted with your lab results within the next 2 weeks.  If you have not heard from us then please contact us. The fastest way to get your results is to register for My Chart. ° ° °IF you received an x-ray today, you will receive an invoice from [facility]. Please contact [facility] at [phone] with questions or concerns regarding your invoice.  ° °IF you received labwork today, you will receive an invoice from [facility]. Please contact [facility] at [phone] with questions or concerns regarding your invoice.  ° °Our billing staff will not be able to assist you with questions regarding bills from these companies. ° °You will be contacted with the lab results as soon as they are available. The fastest way to get your results is to activate your My Chart account. Instructions are located on the last page of this paperwork. If you have not heard from us regarding the results in 2 weeks, please contact this office. °  ° ° ° °

## 2018-11-09 NOTE — Progress Notes (Addendum)
2/3/20204:08 PM  [name] Nov 05, 1955, 63 y.o. female [id number]  Chief Complaint  Patient presents with  . Follow-up    follow up from ER visit    HPI:   Patient is a 62 y.o. female with past medical history significant for recent MSSA bacteremia 2/2 osteomyelitis, CHF exacerbation, among other who presents today for hospital followup  Admitted from 1/25 - 11/04/2018 Seen by cards while in [place] care and home health involved, recommendation was SNF Lasix changed to 27m BID alternating with 47mqday Losartan increased to 5066maily  Discharge diagnosis   Acute on chronic systolic CHF (congestive heart failure) (HCCBayou Cornective Problems:   Pure hypercholesterolemia   Diabetic peripheral neuropathy associated with type 2 diabetes mellitus (HCCLauderhill Essential hypertension, benign   OSA (obstructive sleep apnea)   Chronic renal insufficiency   Paroxysmal atrial fibrillation (HCCAdrian Acute exacerbation of CHF (congestive heart failure) (HCCManitou Beach-Devils LakePatient Care Team: SanRutherford GuysD as PCP - General (Family Medicine) DigCalvert CantorD as Consulting Physician (Ophthalmology) KolLavonia DanaD as Consulting Physician (Internal Medicine) TroElvina MattesatRodman KeyPM as Attending Physician (Podiatry) KleDeboraha SprangD as Consulting Physician (Cardiology) SpiDeloria LairP as TriTippecanoenagement (Gerontology)   Lab Results  Component Value Date   CREATININE 1.13 (H) 11/04/2018   Lab Results  Component Value Date   NA 138 11/04/2018   K 3.6 11/04/2018   CL 95 (L) 11/04/2018   CO2 36 (H) 11/04/2018    Lab Results  Component Value Date   HGBA1C 7.8 (H) 10/22/2018   HGBA1C 7.3 (A) 07/20/2018   HGBA1C 7.4 (H) 04/15/2018   Lab Results  Component Value Date   MICROALBUR 6.9 08/06/2016   LDLCALC 32 04/15/2018   CREATININE 1.13 (H) 11/04/2018   Wt Readings from Last 3 Encounters:  11/09/18 293 lb 12.8 oz (133.3 kg)  11/09/18 291 lb (132  kg)  11/04/18 283 lb 8 oz (128.6 kg)   BP Readings from Last 3 Encounters:  11/09/18 107/60  11/09/18 130/60  11/04/18 (!) 157/67    today's visit: Patient reports increased weight gain and swelling since yesterday Denies SOB Has not skipped any lasix doses Following her low salt diet and 2L fluid restriction Weighing herself daily Was doing lasix 56m28mD and then when received called to alternate BID with qday dosing then she started gaining weight Palliative care and home health have both been out to see her Using walker, off hard shoe, amputation healed, sees podiatry this week Fasting this morning 92, 198 at bedtime last night, this is her usual Sees CHF clinic tomorrow    Fall Risk  11/09/2018 10/29/2018 10/28/2018 10/22/2018 07/20/2018  Falls in the past year? 0 0 0 0 No  Number falls in past yr: 0 0 0 - -  Injury with Fall? 1 0 0 - -  Comment - - - - -  Risk for fall due to : Impaired mobility - - - -  Risk for fall due to: Comment - - - - -  Follow up - - - - -     Depression screen PHQ [facility] 10/30/2018 10/22/2018 07/20/2018  Decreased Interest 0 0 0  Down, Depressed, Hopeless 0 0 0  PHQ - 2 Score 0 0 0  Altered sleeping - - -  Tired, decreased energy - - -  Change in appetite - - -  Feeling bad or failure about yourself  - - -  Trouble concentrating - - -  Moving slowly or fidgety/restless - - -  Suicidal thoughts - - -  PHQ-9 Score - - -  Difficult doing work/chores - - -  Some recent data might be hidden    No Known Allergies  Prior to Admission medications   Medication Sig Start Date End Date Taking? Authorizing Provider  AMBULATORY NON FORMULARY MEDICATION 1 Units by Other route once. Medication Name: foot brace 11/15/15  Yes Patel, Donika K, DO  aspirin EC 81 MG EC tablet Take 1 tablet (81 mg total) by mouth daily. 10/10/18  Yes Salary, Avel Peace, MD  Calcium Carb-Cholecalciferol (CALCIUM 600 + D PO) Take 1 tablet by mouth 2 (two) times daily.   Yes [provider]  carvedilol (COREG) 12.5 MG tablet Take 1 tablet (12.5 mg total) by mouth 2 (two) times daily with a meal. 10/09/18  Yes Salary, Montell D, MD  ceFAZolin (ANCEF) IVPB Inject 2 g into the vein every 8 (eight) hours. Indication: MSSA bacteremia and toe infection Last Day of Therapy:  11/10/2018 Labs - Once weekly:  CBC/D and BMP, Labs - Every other week:  ESR and CRP 10/16/18 11/16/18 Yes Wieting, Richard, MD  citalopram (CELEXA) 20 MG tablet Take 1 tablet (20 mg total) by mouth daily. 04/15/18  Yes Wardell Honour, MD  clopidogrel (PLAVIX) 75 MG tablet Take 1 tablet (75 mg total) by mouth daily. 10/10/18  Yes Salary, Montell D, MD  ferrous sulfate 325 (65 FE) MG tablet Take 325 mg by mouth daily.   Yes [provider]  glimepiride (AMARYL) 4 MG tablet Take 1 tablet (4 mg total) by mouth 2 (two) times daily. 10/22/18  Yes Rutherford Guys, MD  glucose blood (ONE TOUCH ULTRA TEST) test strip Check sugar three times daily dx: DMII uncontrolled insulin dependent with complications 9/93/71  Yes Wardell Honour, MD  insulin glargine (LANTUS) 100 UNIT/ML injection Inject 0.16 mLs (16 Units total) into the skin at bedtime. 10/16/18  Yes Wieting, Richard, MD  Insulin Pen Needle (B-D ULTRAFINE III SHORT PEN) 31G X 8 MM MISC USE AS DIRECTED WITH LANTUS; dx: DMII uncontrolled, insulin dependent, with complications 6/96/78  Yes Wardell Honour, MD  losartan (COZAAR) 50 MG tablet Take 1 tablet (50 mg total) by mouth daily for 30 days. 11/04/18 12/04/18 Yes Pyreddy, Reatha Harps, MD  nitroGLYCERIN (NITROSTAT) 0.4 MG SL tablet Place 1 tablet (0.4 mg total) under the tongue every 5 (five) minutes as needed for chest pain. 10/09/18  Yes Gollan, Kathlene November, MD  omega-3 acid ethyl esters (LOVAZA) 1 g capsule Take 1 g by mouth 2 (two) times daily.   Yes [provider]  rosuvastatin (CRESTOR) 40 MG tablet TAKE 1 TABLET BY MOUTH ONCE DAILY Patient taking differently: Take 40 mg by mouth at bedtime.  12/16/17  Yes  Gollan, Kathlene November, MD  sodium chloride flush (NS) 0.9 % SOLN 20 mLs by Intracatheter route every 8 (eight) hours. 10/16/18  Yes Loletha Grayer, MD  torsemide (DEMADEX) 20 MG tablet Take 40 mg orally twice daily alternating with 40 mg once daily 11/04/18  Yes Pyreddy, Reatha Harps, MD    Past Medical History:  Diagnosis Date  . Acute myocardial infarction, subendocardial infarction, initial episode of care ([place]) 07/21/2012  . Acute osteomyelitis involving ankle and foot ([place]) 02/06/2015  . Acute systolic heart failure ([place]) 07/21/2012   New onset 07/19/12; admission to [facility] ED. Elevated Troponins.  S/p 2D-echo with EF 20-25%.  S/p  cardiac catheterization with stenting LAD.  Repeat 2D-echo 10/2011 with improved EF of 35%.   . Anemia   . Automatic implantable cardioverter-defibrillator in situ    a. MDT CRT-D 06/2014, SN: [id number]    . CAD (coronary artery disease)    a. cardiac cath 101/04/2012: PCI/DES to chronically occluded mLAD, consideration PCI to diag branch in 4 weeks.   . Cataract   . Chicken pox   . Chronic kidney disease   . Chronic systolic CHF (congestive heart failure) (HCC)    a. mixed ICM & NICM; b. EF 20-25% by echo 07/2012, mid-dist 2/3 of LV sev HK/AK, mild MR. echo 10/2012: EF 30-35%, sev HK ant-septal & inf walls, GR1DD, mild MR, PASP 33. c. echo 02/2013: EF 30%, GR1DD, mild MR. echo 04/2014: EF 30%, Septal-lat dyssynchrony, global HK, inf AK, GR1DD, mild MR. d. echo 10/2014: EF50-55%, WM nl, GR1DD, septal mild paradox. e. echo 02/2015: EF 50-55%, wm   . Depression   . Heart attack ([place])   . Heel ulcer ([place]) 04/27/2015  . History of blood transfusion ~ 2011   "plasma; had neuropathy; couldn't walk"  . Hypertension   . LBBB (left bundle branch block)   . Neuromuscular disorder ([place])   . Neuropathy 2011  . Obesity, unspecified   . OSA on CPAP    Moderate with AHI 23/hr and now on CPAP at 16cm H2O.  Her DME is [facility]  . Pure hypercholesterolemia   . Type II diabetes mellitus  ([place])   . Unspecified vitamin D deficiency     Past Surgical History:  Procedure Laterality Date  . AMPUTATION TOE Right 06/18/2015   Procedure: AMPUTATION TOE;  Surgeon: Samara Deist, DPM;  Location: [facility] ORS;  Service: Podiatry;  Laterality: Right;  . AMPUTATION TOE Left 10/08/2018   Procedure: AMPUTATION TOE LEFT 2ND;  Surgeon: Albertine Patricia, DPM;  Location: [facility] ORS;  Service: Podiatry;  Laterality: Left;  . BACK SURGERY    . BI-VENTRICULAR IMPLANTABLE CARDIOVERTER DEFIBRILLATOR N/A 07/06/2014   Procedure: BI-VENTRICULAR IMPLANTABLE CARDIOVERTER DEFIBRILLATOR  (CRT-D);  Surgeon: Deboraha Sprang, MD;  [facility] LAB;  Service: Cardiovascular;  Laterality: N/A;  . BI-VENTRICULAR IMPLANTABLE CARDIOVERTER DEFIBRILLATOR  (CRT-D)  07/06/2014  . BILATERAL OOPHORECTOMY  01/2011   ovarian cyst benign  . COLONOSCOPY WITH PROPOFOL Left 02/22/2015   Procedure: COLONOSCOPY WITH PROPOFOL;  Surgeon: Hulen Luster, MD;  Location: [facility] ENDOSCOPY;  Service: Endoscopy;  Laterality: Left;  . CORONARY ANGIOPLASTY WITH STENT PLACEMENT Left 07/2012   new onset systolic CHF; elevated troponins.  Cardiac catheterization with stenting to LAD; EF 15%.  2D-echo: EF 20-25%.  . ESOPHAGOGASTRODUODENOSCOPY N/A 02/22/2015   Procedure: ESOPHAGOGASTRODUODENOSCOPY (EGD);  Surgeon: Hulen Luster, MD;  Location: [facility] ENDOSCOPY;  Service: Endoscopy;  Laterality: N/A;  . INCISION AND DRAINAGE ABSCESS Right 2007   groin; with ICU stay due to sepsis.  Marland Kitchen LAPAROSCOPIC CHOLECYSTECTOMY  2011  . LEFT HEART CATH AND CORONARY ANGIOGRAPHY N/A 10/05/2018   Procedure: LEFT HEART CATH AND CORONARY ANGIOGRAPHY;  Surgeon: Minna Merritts, MD;  Location: [place] CV LAB;  Service: Cardiovascular;  Laterality: N/A;  . LEFT HEART CATHETERIZATION WITH CORONARY ANGIOGRAM N/A 07/21/2012   Procedure: LEFT HEART CATHETERIZATION WITH CORONARY ANGIOGRAM;  Surgeon: Jolaine Artist, MD;  Location: [facility] CATH LAB;  Service: Cardiovascular;   Laterality: N/A;  . [place]   L4-5  . PERCUTANEOUS CORONARY STENT INTERVENTION (PCI-S) N/A 07/23/2012   Procedure: PERCUTANEOUS CORONARY STENT INTERVENTION (PCI-S);  Surgeon: Sherren Mocha, MD;  Location: [facility] CATH LAB;  Service: Cardiovascular;  Laterality: N/A;  . PERIPHERAL VASCULAR BALLOON ANGIOPLASTY Left 10/06/2018   Procedure: PERIPHERAL VASCULAR BALLOON ANGIOPLASTY;  Surgeon: Katha Cabal, MD;  Location: [place] CV LAB;  Service: Cardiovascular;  Laterality: Left;  . PERIPHERAL VASCULAR CATHETERIZATION N/A 02/10/2015   Procedure: Picc Line Insertion;  Surgeon: Katha Cabal, MD;  Location: [place] CV LAB;  Service: Cardiovascular;  Laterality: N/A;  . TEE WITHOUT CARDIOVERSION N/A 10/02/2018   Procedure: TRANSESOPHAGEAL ECHOCARDIOGRAM (TEE);  Surgeon: Wellington Hampshire, MD;  Location: [facility] ORS;  Service: Cardiovascular;  Laterality: N/A;  . [place]  . VAGINAL HYSTERECTOMY  01/2011   Fibroids/DUB.  Ovaries removed. [place].    Social History   Tobacco Use  . Smoking status: Never Smoker  . Smokeless tobacco: Never Used  Substance Use Topics  . Alcohol use: No    Alcohol/week: 0.0 standard drinks    Family History  Problem Relation Age of Onset  . Diabetes Mother   . Hypertension Mother   . Arthritis Mother        knees, lumbar DDD, cervical DDD  . Cancer Father        prostate,skin,lymphoma.  . Cancer Brother 57       bladder cancer; non-smoker  . Diabetes Maternal Grandmother   . Heart disease Maternal Grandmother   . COPD Maternal Grandmother   . Diabetes Paternal Grandmother   . Obesity Brother   . Diabetes Son   . Hypertension Son   . Cancer Maternal Grandfather   . Diabetes Paternal Grandfather     Review of Systems  Constitutional: Negative for chills, fever and malaise/fatigue.  Respiratory: Negative for cough and shortness of breath.   Cardiovascular: Positive for leg swelling. Negative for chest pain  and palpitations.  Gastrointestinal: Negative for abdominal pain, nausea and vomiting.   Per hpi  OBJECTIVE:  Blood pressure 107/60, pulse 73, temperature 99.3 F (37.4 C), temperature source Oral, height 5' 8" (1.727 m), weight 293 lb 12.8 oz (133.3 kg), SpO2 93 %. Body mass index is 44.67 kg/m.   Physical Exam Vitals signs and nursing note reviewed.  Constitutional:      Appearance: She is well-developed.  HENT:     Head: Normocephalic and atraumatic.     Mouth/Throat:     Pharynx: No oropharyngeal exudate.  Eyes:     General: No scleral icterus.    Conjunctiva/sclera: Conjunctivae normal.     Pupils: Pupils are equal, round, and reactive to light.  Neck:     Musculoskeletal: Neck supple.  Cardiovascular:     Rate and Rhythm: Normal rate and regular rhythm.     Heart sounds: Normal heart sounds. No murmur. No friction rub. No gallop.   Pulmonary:     Effort: Pulmonary effort is normal.     Breath sounds: Normal breath sounds. No wheezing or rales.  Musculoskeletal:     Right lower leg: Edema present.     Left lower leg: Edema present.  Skin:    General: Skin is warm and dry.  Neurological:     Mental Status: She is alert and oriented to person, place, and time.      ASSESSMENT and PLAN   1. Chronic systolic heart failure ([place]) 2. Chronic kidney disease, stage III (moderate) (HCC) 3. History of amputation of lesser toe of left foot ([place])  Last admission reviewed. Supportive services in place. Fluid up today, discussed taking  an extra 34m lasix today, importance of salt and fluid restrictions and daily weight. Keep appt with CHF clinic tomorrow. Almost done with IV abx, last dose tomorrow. PICC line to be removed by ID/wound nurse as instructed. followup with podiatry as scheduled. cbgs acceptable.   Return for as scheduled. feb 21st 2020, sooner if needed    IRutherford Guys MD [facility] [place] [id number].   [phone] [phone]

## 2018-11-09 NOTE — Telephone Encounter (Signed)
Spoke with the patient. She stated that she has gained 6 pounds since 1/30 but has lost 1 pound overnight. She is currently taking torsemide 40 mg bid alternating with 40 mg daily. She stated that she has had popcorn lately and has been advised to watch her sodium intake as this can cause retained fluid.   She denies shortness of breath and stated that her feet are still swollen but not any worse than when she was discharged at the hospital on 1/29. Her home health nurse came out today and stated that her oxygen level was 97%.  She has an appointment at the heart failure clinic tomorrow and PCP today. She has been advised to keep those appointments. Message will be routed to the heart failure clinic for their knowledge.

## 2018-11-09 NOTE — Progress Notes (Signed)
[facility] Telephone: [phone] Fax: [phone]  PATIENT NAME: [name] DOB: 1956/01/24 MRN: [id number]  PRIMARY CARE PROVIDER:   Rutherford Guys, MD  REFERRING PROVIDER:  Rutherford Guys, MD [address] [place], [place] [address]  RESPONSIBLE PARTY: Self   ASSESSMENT: [name] is alert and oriented x 3. No acute distress noted. 2+ non-pitting edema to bilateral feet. Discussed role of Palliative Medicine. Discussed goals of care. Discussed symptom management. Education provided on disease process as well as sodium intake. Discussed code status. [name] states she does have a Living Will.   RECOMMENDATIONS and PLAN:  1. Code Status: Full Code; discussed CPR at length; this will be an ongoing discussion. 2. Medical goals of therapy: Mrs. Heindl to complete IV antibiotics for her osteomyelitis. She is to follow up In CHF clinic as scheduled; next appointment on 11/10/2018. [place] with [place]. Limit Hospitalizations.  3. Symptom management: edema- continue torsemide 34m BID, alternating with 450mevery other day. She is encouraged to elevate her legs. Education provided on salt intake. Weigh her self daily; notify Heart Failure Clinic at [phone] parameters: call if she has gained 2 pounds over night or 5 pounds in a week. 4. Discharge planning: continue to reside at home with family. 5. Emotional/spiritual support: Discussed with [name] is encouraged to call with questions.   Palliative Care to continue to follow for emotional/spiritual support, ongoing discussions trajectory of chronic disease progression, medical goals of therapy, monitor for symptoms with management, and reduce ED and hospitalizations with recommendations.  Discussed patient with Heart Failure Clinic via phone.   Palliative Medicine to follow up in 4 weeks or sooner, if needed.  I spent 45 minutes providing this  consultation,  from 12:30pm to 1:15pm. More than 50% of the time in this consultation was spent coordinating communication.   HISTORY OF PRESENT ILLNESS:  Vickie CEDENOs a [age].o. year old female with multiple medical problems including acute on chronic congestive heart failure, hypercholesterolemia, diabetic peripheral neuropathy, type 2 diabetes, essential hypertension, obstructive sleep apnea, chronic renal insufficiency, paroxysmal atrial fibrillation, osteomyelitis of left foot. Mrs. GeStolareports having three hospitalizations since December; she has been hospitalized for osteomyelitis and CHF exacerbation. She states that [facility] currently seeing her in the home; she is receiving  PT and SN services. She currently has  PICC line and is receiving IV antibiotics for her osteomyelitis. She denies pain. She reports dyspnea having improved since discharging from hospital last week. She is wearing oxygen via nasal canula at 2 liters at night. She has a CPAP, but has not used it in about two years. She reports a 5 pound weight gain over night and has called clinic and is awaiting response. She is receiving torsemide 4071mID, alternating with torsemide 29m35mily. She reports edema to lower extremities that worsens as the day goes on; she states her edema is the same since discharging from the hospital. She uses walker for ambulation. She is able to complete her adl's. She denies fatigue. She reports a good appetite. She states her morning blood sugars are around 92mg81m night time blood sugars are less than 200mg/56mPalliative Care was asked to help address goals of care.   CODE STATUS: Full Code  PPS: 50% HOSPICE ELIGIBILITY/DIAGNOSIS: TBD  PAST MEDICAL HISTORY:  Past Medical History:  Diagnosis Date  . Acute myocardial infarction, subendocardial infarction, initial episode of care (HCC) 1[place]5/2013  .  Acute osteomyelitis involving ankle and foot ([place]) 02/06/2015  . Acute  systolic heart failure ([place]) 07/21/2012   New onset 07/19/12; admission to [facility] ED. Elevated Troponins.  S/p 2D-echo with EF 20-25%.  S/p cardiac catheterization with stenting LAD.  Repeat 2D-echo 10/2011 with improved EF of 35%.   . Anemia   . Automatic implantable cardioverter-defibrillator in situ    a. MDT CRT-D 06/2014, SN: [id number]    . CAD (coronary artery disease)    a. cardiac cath 101/04/2012: PCI/DES to chronically occluded mLAD, consideration PCI to diag branch in 4 weeks.   . Cataract   . Chicken pox   . Chronic kidney disease   . Chronic systolic CHF (congestive heart failure) (HCC)    a. mixed ICM & NICM; b. EF 20-25% by echo 07/2012, mid-dist 2/3 of LV sev HK/AK, mild MR. echo 10/2012: EF 30-35%, sev HK ant-septal & inf walls, GR1DD, mild MR, PASP 33. c. echo 02/2013: EF 30%, GR1DD, mild MR. echo 04/2014: EF 30%, Septal-lat dyssynchrony, global HK, inf AK, GR1DD, mild MR. d. echo 10/2014: EF50-55%, WM nl, GR1DD, septal mild paradox. e. echo 02/2015: EF 50-55%, wm   . Depression   . Heart attack ([place])   . Heel ulcer ([place]) 04/27/2015  . History of blood transfusion ~ 2011   "plasma; had neuropathy; couldn't walk"  . Hypertension   . LBBB (left bundle branch block)   . Neuromuscular disorder ([place])   . Neuropathy 2011  . Obesity, unspecified   . OSA on CPAP    Moderate with AHI 23/hr and now on CPAP at 16cm H2O.  Her DME is [facility]  . Pure hypercholesterolemia   . Type II diabetes mellitus ([place])   . Unspecified vitamin D deficiency     SOCIAL HX:  Social History   Tobacco Use  . Smoking status: Never Smoker  . Smokeless tobacco: Never Used  Substance Use Topics  . Alcohol use: No    Alcohol/week: 0.0 standard drinks    ALLERGIES: No Known Allergies   PERTINENT MEDICATIONS:  Outpatient Encounter Medications as of 11/09/2018  Medication Sig  . aspirin EC 81 MG EC tablet Take 1 tablet (81 mg total) by mouth daily.  . Calcium Carb-Cholecalciferol (CALCIUM 600 + D PO) Take  1 tablet by mouth 2 (two) times daily.  . carvedilol (COREG) 12.5 MG tablet Take 1 tablet (12.5 mg total) by mouth 2 (two) times daily with a meal.  . ceFAZolin (ANCEF) IVPB Inject 2 g into the vein every 8 (eight) hours. Indication: MSSA bacteremia and toe infection Last Day of Therapy:  11/10/2018 Labs - Once weekly:  CBC/D and BMP, Labs - Every other week:  ESR and CRP  . citalopram (CELEXA) 20 MG tablet Take 1 tablet (20 mg total) by mouth daily.  . clopidogrel (PLAVIX) 75 MG tablet Take 1 tablet (75 mg total) by mouth daily.  . ferrous sulfate 325 (65 FE) MG tablet Take 325 mg by mouth daily.  Marland Kitchen glimepiride (AMARYL) 4 MG tablet Take 1 tablet (4 mg total) by mouth 2 (two) times daily.  Marland Kitchen glucose blood (ONE TOUCH ULTRA TEST) test strip Check sugar three times daily dx: DMII uncontrolled insulin dependent with complications  . insulin glargine (LANTUS) 100 UNIT/ML injection Inject 0.16 mLs (16 Units total) into the skin at bedtime.  Marland Kitchen losartan (COZAAR) 50 MG tablet Take 1 tablet (50 mg total) by mouth daily for 30 days.  . nitroGLYCERIN (NITROSTAT) 0.4 MG SL tablet Place  1 tablet (0.4 mg total) under the tongue every 5 (five) minutes as needed for chest pain.  Marland Kitchen omega-3 acid ethyl esters (LOVAZA) 1 g capsule Take 1 g by mouth 2 (two) times daily.  . rosuvastatin (CRESTOR) 40 MG tablet TAKE 1 TABLET BY MOUTH ONCE DAILY (Patient taking differently: Take 40 mg by mouth at bedtime. )  . sodium chloride flush (NS) 0.9 % SOLN 20 mLs by Intracatheter route every 8 (eight) hours.  . torsemide (DEMADEX) 20 MG tablet Take 40 mg orally twice daily alternating with 40 mg once daily  . AMBULATORY NON FORMULARY MEDICATION 1 Units by Other route once. Medication Name: foot brace  . Insulin Pen Needle (B-D ULTRAFINE III SHORT PEN) 31G X 8 MM MISC USE AS DIRECTED WITH LANTUS; dx: DMII uncontrolled, insulin dependent, with complications   No facility-administered encounter medications on file as of 11/09/2018.      PHYSICAL EXAM:   General: NAD Cardiovascular: regular rate and rhythm Pulmonary: clear ant fields Abdomen: soft, nontender, + bowel sounds GU: no suprapubic tenderness Extremities: 2+ non-pitting edema Skin: no rashes Neurological: Weakness but otherwise nonfocal  Ezekiel Slocumb, NP

## 2018-11-09 NOTE — Telephone Encounter (Signed)
Pt c/o swelling: STAT is pt has developed SOB within 24 hours  1) How much weight have you gained and in what time span? 6 lbs yesterday lost 1 lb today   2) If swelling, where is the swelling located? Feet and ankles   3) Are you currently taking a fluid pill? Yes   4) Are you currently SOB? No   5) Do you have a log of your daily weights (if so, list)?  Yes       1/30  284     1/31  285     02/01  284     02/02  292     02/03  291   6) Have you gained 3 pounds in a day or 5 pounds in a week? Yes   7) Have you traveled recently?  No

## 2018-11-09 NOTE — Telephone Encounter (Signed)
Copied from [place] [id number]. Topic: Quick Communication - Home Health Verbal Orders >> Nov 09, 2018  1:07 PM Sharene Skeans wrote: Caller/Agency: [facility] Number: [phone] (secure)  Requesting Skilled Nursing  Frequency: 1x a week for 5 weeks  Pt has been in hospital

## 2018-11-10 ENCOUNTER — Ambulatory Visit: Payer: PPO | Admitting: Family

## 2018-11-10 ENCOUNTER — Encounter: Payer: Self-pay | Admitting: Family

## 2018-11-10 ENCOUNTER — Telehealth: Payer: Self-pay | Admitting: Family Medicine

## 2018-11-10 ENCOUNTER — Ambulatory Visit
Admission: RE | Admit: 2018-11-10 | Discharge: 2018-11-10 | Disposition: A | Discharge status: Home / Self Care | Payer: PPO | Source: Ambulatory Visit | Attending: Family | Admitting: Family

## 2018-11-10 ENCOUNTER — Other Ambulatory Visit: Payer: Self-pay | Admitting: Family

## 2018-11-10 VITALS — BP 123/52 | HR 65 | Resp 18 | Ht 68.0 in | Wt 292.4 lb

## 2018-11-10 DIAGNOSIS — Z807 Family history of other malignant neoplasms of lymphoid, hematopoietic and related tissues: Secondary | ICD-10-CM | POA: Insufficient documentation

## 2018-11-10 DIAGNOSIS — Z794 Long term (current) use of insulin: Secondary | ICD-10-CM

## 2018-11-10 DIAGNOSIS — F329 Major depressive disorder, single episode, unspecified: Secondary | ICD-10-CM | POA: Insufficient documentation

## 2018-11-10 DIAGNOSIS — Z808 Family history of malignant neoplasm of other organs or systems: Secondary | ICD-10-CM

## 2018-11-10 DIAGNOSIS — I130 Hypertensive heart and chronic kidney disease with heart failure and stage 1 through stage 4 chronic kidney disease, or unspecified chronic kidney disease: Secondary | ICD-10-CM

## 2018-11-10 DIAGNOSIS — I5023 Acute on chronic systolic (congestive) heart failure: Secondary | ICD-10-CM | POA: Insufficient documentation

## 2018-11-10 DIAGNOSIS — Z8052 Family history of malignant neoplasm of bladder: Secondary | ICD-10-CM | POA: Diagnosis not present

## 2018-11-10 DIAGNOSIS — E559 Vitamin D deficiency, unspecified: Secondary | ICD-10-CM | POA: Insufficient documentation

## 2018-11-10 DIAGNOSIS — N189 Chronic kidney disease, unspecified: Secondary | ICD-10-CM

## 2018-11-10 DIAGNOSIS — D649 Anemia, unspecified: Secondary | ICD-10-CM | POA: Insufficient documentation

## 2018-11-10 DIAGNOSIS — Z833 Family history of diabetes mellitus: Secondary | ICD-10-CM | POA: Insufficient documentation

## 2018-11-10 DIAGNOSIS — Z89422 Acquired absence of other left toe(s): Secondary | ICD-10-CM | POA: Insufficient documentation

## 2018-11-10 DIAGNOSIS — Z95810 Presence of automatic (implantable) cardiac defibrillator: Secondary | ICD-10-CM | POA: Insufficient documentation

## 2018-11-10 DIAGNOSIS — E1122 Type 2 diabetes mellitus with diabetic chronic kidney disease: Secondary | ICD-10-CM

## 2018-11-10 DIAGNOSIS — E7800 Pure hypercholesterolemia, unspecified: Secondary | ICD-10-CM | POA: Insufficient documentation

## 2018-11-10 DIAGNOSIS — E669 Obesity, unspecified: Secondary | ICD-10-CM | POA: Insufficient documentation

## 2018-11-10 DIAGNOSIS — Z89421 Acquired absence of other right toe(s): Secondary | ICD-10-CM | POA: Insufficient documentation

## 2018-11-10 DIAGNOSIS — A4101 Sepsis due to Methicillin susceptible Staphylococcus aureus: Secondary | ICD-10-CM | POA: Diagnosis not present

## 2018-11-10 DIAGNOSIS — Z8249 Family history of ischemic heart disease and other diseases of the circulatory system: Secondary | ICD-10-CM | POA: Insufficient documentation

## 2018-11-10 DIAGNOSIS — I447 Left bundle-branch block, unspecified: Secondary | ICD-10-CM

## 2018-11-10 DIAGNOSIS — I10 Essential (primary) hypertension: Secondary | ICD-10-CM

## 2018-11-10 DIAGNOSIS — G4733 Obstructive sleep apnea (adult) (pediatric): Secondary | ICD-10-CM | POA: Insufficient documentation

## 2018-11-10 DIAGNOSIS — Z7902 Long term (current) use of antithrombotics/antiplatelets: Secondary | ICD-10-CM | POA: Insufficient documentation

## 2018-11-10 DIAGNOSIS — E11628 Type 2 diabetes mellitus with other skin complications: Secondary | ICD-10-CM | POA: Diagnosis not present

## 2018-11-10 DIAGNOSIS — Z8042 Family history of malignant neoplasm of prostate: Secondary | ICD-10-CM

## 2018-11-10 DIAGNOSIS — I890 Lymphedema, not elsewhere classified: Secondary | ICD-10-CM | POA: Insufficient documentation

## 2018-11-10 DIAGNOSIS — I2510 Atherosclerotic heart disease of native coronary artery without angina pectoris: Secondary | ICD-10-CM

## 2018-11-10 DIAGNOSIS — E1142 Type 2 diabetes mellitus with diabetic polyneuropathy: Secondary | ICD-10-CM

## 2018-11-10 DIAGNOSIS — Z7982 Long term (current) use of aspirin: Secondary | ICD-10-CM | POA: Insufficient documentation

## 2018-11-10 DIAGNOSIS — Z79899 Other long term (current) drug therapy: Secondary | ICD-10-CM

## 2018-11-10 DIAGNOSIS — Z955 Presence of coronary angioplasty implant and graft: Secondary | ICD-10-CM

## 2018-11-10 DIAGNOSIS — I252 Old myocardial infarction: Secondary | ICD-10-CM | POA: Insufficient documentation

## 2018-11-10 DIAGNOSIS — Z6841 Body Mass Index (BMI) 40.0 and over, adult: Secondary | ICD-10-CM | POA: Insufficient documentation

## 2018-11-10 DIAGNOSIS — I5022 Chronic systolic (congestive) heart failure: Secondary | ICD-10-CM

## 2018-11-10 LAB — BASIC METABOLIC PANEL
ANION GAP: 8 (ref 5–15)
BUN: 45 mg/dL — ABNORMAL HIGH (ref 8–23)
CALCIUM: 8.2 mg/dL — AB (ref 8.9–10.3)
CO2: 31 mmol/L (ref 22–32)
Chloride: 99 mmol/L (ref 98–111)
Creatinine, Ser: 1.4 mg/dL — ABNORMAL HIGH (ref 0.44–1.00)
GFR calc Af Amer: 46 mL/min — ABNORMAL LOW (ref >60)
GFR, EST NON AFRICAN AMERICAN: 40 mL/min — AB (ref >60)
Glucose, Bld: 67 mg/dL — ABNORMAL LOW (ref 70–99)
Potassium: 3.7 mmol/L (ref 3.5–5.1)
Sodium: 138 mmol/L (ref 135–145)

## 2018-11-10 LAB — BRAIN NATRIURETIC PEPTIDE: B NATRIURETIC PEPTIDE 5: 1253 pg/mL — AB (ref 0.0–100.0)

## 2018-11-10 MED ORDER — FUROSEMIDE 10 MG/ML IJ SOLN
80.0000 mg | Freq: Once | INTRAMUSCULAR | Status: AC
  Administered 2018-11-10: 80 mg via INTRAVENOUS

## 2018-11-10 MED ORDER — FUROSEMIDE 10 MG/ML IJ SOLN
INTRAMUSCULAR | Status: AC
  Filled 2018-11-10: qty 8

## 2018-11-10 MED ORDER — POTASSIUM CHLORIDE CRYS ER 20 MEQ PO TBCR
40.0000 meq | EXTENDED_RELEASE_TABLET | Freq: Once | ORAL | Status: AC
  Administered 2018-11-10: 40 meq via ORAL

## 2018-11-10 MED ORDER — SODIUM CHLORIDE (PF) 0.9 % IJ SOLN
INTRAMUSCULAR | Status: AC
  Filled 2018-11-10: qty 10

## 2018-11-10 NOTE — Telephone Encounter (Signed)
[name] with [facility] calling States they are awaiting orders for nursing to go back in to see patient  Would like to know if we will be able to give those as patient is in need Please call [phone] to discuss

## 2018-11-10 NOTE — Addendum Note (Signed)
Addended by: Rutherford Guys on: 11/10/2018 07:16 PM   Modules accepted: Level of Service

## 2018-11-10 NOTE — Progress Notes (Signed)
Patient ID: [name], female    DOB: 11/10/1955, 63 y.o.   MRN: [id number]  HPI  [name] is a 63 y/o female with a history of CAD, DM, hyperlipidemia, HTN, CKD, anemia, vitamin D deficiency, obstructive sleep apnea, osteomyelitis, depression and chronic heart failure.   Echo report from 10/02/18 reviewed and showed an EF of 45-50% along with mild MR and trivial AR but without vegetation.   Cardiac catheterization done 10/05/18 showed:   Medical management was recommended.   Prox Cx to Dist Cx lesion is 80% stenosed.  Prox LAD-1 lesion is 40% stenosed.  Prox LAD-2 lesion is 90% stenosed.  Prox LAD to Mid LAD lesion is 80% stenosed.  Mid LAD to Dist LAD lesion is 70% stenosed.  1st Diag lesion is 90% stenosed.  Ost 1st Diag lesion is 80% stenosed.  Mid RCA-1 lesion is 40% stenosed.  Mid RCA-2 lesion is 40% stenosed.  Admitted 10/30/2018 due to acute on chronic HF. Diuresed with IV lasix and then transitioned to oral diuretics. Cardiology consult was obtained. Continues on antibiotics via her PICC line. Weaned off oxygen. Discharged after 5 days. Was in the ED 10/27/2018 due to leg swelling, shortness of breath and anemia. Reported 36 pound weight gain since recent hospital discharge. Given IV lasix with good urine output and she was released. Admitted 10/12/2018 due to pneumonia. Palliative care and wound consults obtained. Treated with antibiotics. Given IV fluids due to acute kidney injury.Recent MSSA bacteremia on 6 weeks of IV Ancef. Discharged after 4 days.   She presents today for a follow-up visit with a chief complaint of moderate fatigue upon minimal exertion. She describes this as chronic in nature having been present for several years. She has associated cough, pedal edema and difficulty sleeping along with this. She denies any abdominal distention, palpitations, chest pain, wheezing, shortness of breath, dizziness or weight gain.  She received 21m IV lasix/ 429m  PO potassium yesterday and feels like her edema is slightly better. Now taking torsemide 4068mID.   Past Medical History:  Diagnosis Date  . Acute myocardial infarction, subendocardial infarction, initial episode of care (HCC[place]0/15/2013  . Acute osteomyelitis involving ankle and foot (HCC[place]/11/2014  . Acute systolic heart failure ([place]CC[place]0/15/2013   New onset 07/19/12; [facility]. Elevated Troponins.  S/p 2D-echo with EF 20-25%.  S/p cardiac catheterization with stenting LAD.  Repeat 2D-echo 10/2011 with improved EF of 35%.   . Anemia   . Automatic implantable cardioverter-defibrillator in situ    a. MDT CRT-D 06/2014, SN: B: [id number]   . CAD (coronary artery disease)    a. cardiac cath 101/04/2012: PCI/DES to chronically occluded mLAD, consideration PCI to diag branch in 4 weeks.   . Cataract   . Chicken pox   . Chronic kidney disease   . Chronic systolic CHF (congestive heart failure) (HCC)    a. mixed ICM & NICM; b. EF 20-25% by echo 07/2012, mid-dist 2/3 of LV sev HK/AK, mild MR. echo 10/2012: EF 30-35%, sev HK ant-septal & inf walls, GR1DD, mild MR, PASP 33. c. echo 02/2013: EF 30%, GR1DD, mild MR. echo 04/2014: EF 30%, Septal-lat dyssynchrony, global HK, inf AK, GR1DD, mild MR. d. echo 10/2014: EF50-55%, WM nl, GR1DD, septal mild paradox. e. echo 02/2015: EF 50-55%, wm   . Depression   . Heart attack ([place] . Heel ulcer (HCC[place]/21/2016  . History of blood transfusion ~ 2011   "plasma; had neuropathy;  couldn't walk"  . Hypertension   . LBBB (left bundle branch block)   . Neuromuscular disorder ([place])   . Neuropathy 2011  . Obesity, unspecified   . OSA on CPAP    Moderate with AHI 23/hr and now on CPAP at 16cm H2O.  Her DME is [facility]  . Pure hypercholesterolemia   . Type II diabetes mellitus ([place])   . Unspecified vitamin D deficiency    Past Surgical History:  Procedure Laterality Date  . AMPUTATION TOE Right 06/18/2015   Procedure: AMPUTATION TOE;  Surgeon: Samara Deist, DPM;  Location: [facility] ORS;  Service: Podiatry;  Laterality: Right;  . AMPUTATION TOE Left 10/08/2018   Procedure: AMPUTATION TOE LEFT 2ND;  Surgeon: Albertine Patricia, DPM;  Location: [facility] ORS;  Service: Podiatry;  Laterality: Left;  . BACK SURGERY    . BI-VENTRICULAR IMPLANTABLE CARDIOVERTER DEFIBRILLATOR N/A 07/06/2014   Procedure: BI-VENTRICULAR IMPLANTABLE CARDIOVERTER DEFIBRILLATOR  (CRT-D);  Surgeon: Deboraha Sprang, MD;  Location: [facility] CATH LAB;  Service: Cardiovascular;  Laterality: N/A;  . BI-VENTRICULAR IMPLANTABLE CARDIOVERTER DEFIBRILLATOR  (CRT-D)  07/06/2014  . BILATERAL OOPHORECTOMY  01/2011   ovarian cyst benign  . COLONOSCOPY WITH PROPOFOL Left 02/22/2015   Procedure: COLONOSCOPY WITH PROPOFOL;  Surgeon: Hulen Luster, MD;  Location: [facility] ENDOSCOPY;  Service: Endoscopy;  Laterality: Left;  . CORONARY ANGIOPLASTY WITH STENT PLACEMENT Left 07/2012   new onset systolic CHF; elevated troponins.  Cardiac catheterization with stenting to LAD; EF 15%.  2D-echo: EF 20-25%.  . ESOPHAGOGASTRODUODENOSCOPY N/A 02/22/2015   Procedure: ESOPHAGOGASTRODUODENOSCOPY (EGD);  Surgeon: Hulen Luster, MD;  Location[facility] ENDOSCOPY;  Service: Endoscopy;  Laterality: N/A;  . INCISION AND DRAINAGE ABSCESS Right 2007   groin; with ICU stay due to sepsis.  Marland Kitchen LAPAROSCOPIC CHOLECYSTECTOMY  2011  . LEFT HEART CATH AND CORONARY ANGIOGRAPHY N/A 10/05/2018   Procedure: LEFT HEART CATH AND CORONARY ANGIOGRAPHY;  Surgeon: Minna Merritts, MD;  Location: [place] CV LAB;  Service: Cardiovascular;  Laterality: N/A;  . LEFT HEART CATHETERIZATION WITH CORONARY ANGIOGRAM N/A 07/21/2012   Procedure: LEFT HEART CATHETERIZATION WITH CORONARY ANGIOGRAM;  Surgeon: Jolaine Artist, MD;  Location: [facility] CATH LAB;  Service: Cardiovascular;  Laterality: N/A;  . [place]   L4-5  . PERCUTANEOUS CORONARY STENT INTERVENTION (PCI-S) N/A 07/23/2012   Procedure: PERCUTANEOUS CORONARY STENT INTERVENTION (PCI-S);   Surgeon: Sherren Mocha, MD;  Location: [facility] CATH LAB;  Service: Cardiovascular;  Laterality: N/A;  . PERIPHERAL VASCULAR BALLOON ANGIOPLASTY Left 10/06/2018   Procedure: PERIPHERAL VASCULAR BALLOON ANGIOPLASTY;  Surgeon: Katha Cabal, MD;  Location: [place] CV LAB;  Service: Cardiovascular;  Laterality: Left;  . PERIPHERAL VASCULAR CATHETERIZATION N/A 02/10/2015   Procedure: Picc Line Insertion;  Surgeon: Katha Cabal, MD;  Location: [place] CV LAB;  Service: Cardiovascular;  Laterality: N/A;  . TEE WITHOUT CARDIOVERSION N/A 10/02/2018   Procedure: TRANSESOPHAGEAL ECHOCARDIOGRAM (TEE);  Surgeon: Wellington Hampshire, MD;  Location: [facility] ORS;  Service: Cardiovascular;  Laterality: N/A;  . [name]  . VAGINAL HYSTERECTOMY  01/2011   Fibroids/DUB.  Ovaries removed. [place].   Family History  Problem Relation Age of Onset  . Diabetes Mother   . Hypertension Mother   . Arthritis Mother        knees, lumbar DDD, cervical DDD  . Cancer Father        prostate,skin,lymphoma.  . Cancer Brother 17       bladder cancer; non-smoker  . Diabetes  Maternal Grandmother   . Heart disease Maternal Grandmother   . COPD Maternal Grandmother   . Diabetes Paternal Grandmother   . Obesity Brother   . Diabetes Son   . Hypertension Son   . Cancer Maternal Grandfather   . Diabetes Paternal Grandfather    Social History   Tobacco Use  . Smoking status: Never Smoker  . Smokeless tobacco: Never Used  Substance Use Topics  . Alcohol use: No    Alcohol/week: 0.0 standard drinks   No Known Allergies  Prior to Admission medications   Medication Sig Start Date End Date Taking? Authorizing Provider  AMBULATORY NON FORMULARY MEDICATION 1 Units by Other route once. Medication Name: foot brace 11/15/15  Yes Patel, Donika K, DO  aspirin EC 81 MG EC tablet Take 1 tablet (81 mg total) by mouth daily. 10/10/18  Yes Salary, Avel Peace, MD  Calcium Carb-Cholecalciferol (CALCIUM 600 + D PO)  Take 1 tablet by mouth 2 (two) times daily.   Yes [provider]  carvedilol (COREG) 12.5 MG tablet Take 1 tablet (12.5 mg total) by mouth 2 (two) times daily with a meal. 10/09/18  Yes Salary, Montell D, MD  ceFAZolin (ANCEF) IVPB Inject 2 g into the vein every 8 (eight) hours. Indication: MSSA bacteremia and toe infection Last Day of Therapy:  11/10/2018 Labs - Once weekly:  CBC/D and BMP, Labs - Every other week:  ESR and CRP 10/16/18 11/16/18 Yes Wieting, Richard, MD  citalopram (CELEXA) 20 MG tablet Take 1 tablet (20 mg total) by mouth daily. 04/15/18  Yes Wardell Honour, MD  clopidogrel (PLAVIX) 75 MG tablet Take 1 tablet (75 mg total) by mouth daily. 10/10/18  Yes Salary, Montell D, MD  ferrous sulfate 325 (65 FE) MG tablet Take 325 mg by mouth daily.   Yes [provider]  glimepiride (AMARYL) 4 MG tablet Take 1 tablet (4 mg total) by mouth 2 (two) times daily. 10/22/18  Yes Rutherford Guys, MD  glucose blood (ONE TOUCH ULTRA TEST) test strip Check sugar three times daily dx: DMII uncontrolled insulin dependent with complications 2/44/01  Yes Wardell Honour, MD  insulin glargine (LANTUS) 100 UNIT/ML injection Inject 0.16 mLs (16 Units total) into the skin at bedtime. 10/16/18  Yes Wieting, Richard, MD  Insulin Pen Needle (B-D ULTRAFINE III SHORT PEN) 31G X 8 MM MISC USE AS DIRECTED WITH LANTUS; dx: DMII uncontrolled, insulin dependent, with complications 0/27/25  Yes Wardell Honour, MD  losartan (COZAAR) 50 MG tablet Take 1 tablet (50 mg total) by mouth daily for 30 days. 11/04/18 12/04/18 Yes Pyreddy, Reatha Harps, MD  nitroGLYCERIN (NITROSTAT) 0.4 MG SL tablet Place 1 tablet (0.4 mg total) under the tongue every 5 (five) minutes as needed for chest pain. 10/09/18  Yes Gollan, Kathlene November, MD  omega-3 acid ethyl esters (LOVAZA) 1 g capsule Take 1 g by mouth 2 (two) times daily.   Yes [provider]  rosuvastatin (CRESTOR) 40 MG tablet TAKE 1 TABLET BY MOUTH ONCE DAILY Patient  taking differently: Take 40 mg by mouth at bedtime.  12/16/17  Yes Gollan, Kathlene November, MD  sodium chloride flush (NS) 0.9 % SOLN 20 mLs by Intracatheter route every 8 (eight) hours. 10/16/18  Yes Loletha Grayer, MD  torsemide (DEMADEX) 20 MG tablet Take 40 mg orally twice daily alternating with 40 mg once daily 11/04/18  Yes Pyreddy, Pavan, MD    Review of Systems  Constitutional: Positive for fatigue (easily). Negative for appetite  change.  HENT: Positive for congestion. Negative for rhinorrhea and sore throat.   Eyes: Negative.   Respiratory: Positive for cough. Negative for shortness of breath and wheezing.   Cardiovascular: Positive for leg swelling (better). Negative for chest pain and palpitations.  Gastrointestinal: Negative for abdominal distention and abdominal pain.  Endocrine: Negative.   Genitourinary: Negative.   Musculoskeletal: Positive for back pain. Negative for neck pain.  Skin: Positive for wound (left 2nd toe amputated).  Allergic/Immunologic: Negative.   Neurological: Negative for dizziness and light-headedness.  Hematological: Negative for adenopathy. Does not bruise/bleed easily.  Psychiatric/Behavioral: Positive for sleep disturbance (sleeping on 1 pillow with oxygen @ 2L). Negative for dysphoric mood. The patient is not nervous/anxious.    Vitals:   11/11/18 1442  BP: (!) 119/46  Pulse: 66  Resp: 18  SpO2: 93%  Weight: 292 lb 8 oz (132.7 kg)  Height: 5' 8"  (1.727 m)   Wt Readings from Last 3 Encounters:  11/11/18 292 lb 8 oz (132.7 kg)  11/10/18 292 lb 6 oz (132.6 kg)  11/09/18 293 lb 12.8 oz (133.3 kg)   Lab Results  Component Value Date   CREATININE 1.40 (H) 11/10/2018   CREATININE 1.13 (H) 11/04/2018   CREATININE 1.06 (H) 11/03/2018    Physical Exam Vitals signs and nursing note reviewed.  Constitutional:      Appearance: She is well-developed.  HENT:     Head: Normocephalic and atraumatic.  Neck:     Musculoskeletal: Normal range of motion  and neck supple.  Cardiovascular:     Rate and Rhythm: Normal rate and regular rhythm.  Pulmonary:     Effort: Pulmonary effort is normal. No respiratory distress.     Breath sounds: No rhonchi or rales.  Abdominal:     General: There is no distension.     Palpations: Abdomen is soft.  Musculoskeletal:     Right lower leg: She exhibits no tenderness. Edema (3+ pitting although softer) present.     Left lower leg: She exhibits no tenderness. Edema (3+ pitting although softer) present.  Skin:    General: Skin is warm and dry.  Neurological:     General: No focal deficit present.     Mental Status: She is alert and oriented to person, place, and time.  Psychiatric:        Mood and Affect: Mood normal.        Behavior: Behavior normal.    Assessment & Plan:  1: Chronic heart failure with mildly reduced ejection fraction- - NYHA class III - fluid overloaded today but seems to feel better - weighing daily and she was instructed to call for an overnight weight gain of >2 pounds or a weekly weight gain of >5 pounds - weight unchanged from yesterday - received 41m IV lasix/ 491m PO potassium yesterday - torsemide increased to 4075mID yesterday - doesn't add salt to her food (except tomatoes) but admits that she often eats salty foods. When her daughter does the cooking, she will fix macaroni and cheese, fried weenies etc and patient is aware that those foods are higher in sodium - saw EP (KlCaryl Comes/21/2020  - sees cardiology ([place]) 11/26/2018 - BNP 11/10/2018 was 1253.0 - she says that she's received her flu vaccine for this season - wears oxygen @ 2L at bedtime  2: HTN- - BP looks good today - saw PCP (SaPamella Pert/12/2018 - BMP from 11/10/2018 reviewed and showed sodium 138, potassium 3.7, creatinine 1.40 and GFR 40 -  will get BMP today & adjust diuretic if able pending those results  3: DM- - A1c 10/22/2018 was 7.8% - glucose today was 92  4: Lymphedema- - stage 2 - does elevate  her legs but says that her edema continues - has tried wearing support socks but says that they cut into her leg behind her knee causing pain - currently unable to exercise due to symptoms - consider lymphapress compression boots if symptoms persists; will need to wait the 30 days for insurance approval (1st visit was 10/28/2018)  Medication list was reviewed.  Return in 1 month or sooner for any questions/problems before then.

## 2018-11-10 NOTE — Telephone Encounter (Signed)
Copied from [place] [phone]. Topic: Quick Communication - Home Health Verbal Orders >> Nov 10, 2018  8:33 AM Berneta Levins wrote: Caller/Agency: Caryl Pina with [place] Number: [phone], OK to leave a message Requesting OT/PT/Skilled Nursing/Social Work: resume skilled nursing Frequency: 1 week 5

## 2018-11-10 NOTE — Patient Instructions (Addendum)
Continue weighing daily and call for an overnight weight gain of > 2 pounds or a weekly weight gain of >5 pounds.  Begin 40mg  twice daily of torsemide.

## 2018-11-10 NOTE — Telephone Encounter (Signed)
Called and spoike with [facility] nurse. They need order for 1 nurse visit per week for 5 weeks to address patient's CHF management. They need to make sure Dr Rockey Situ will sign the orders. Advised them I will verify this is ok with Dr Rockey Situ.

## 2018-11-10 NOTE — Progress Notes (Signed)
Patient ID: [name], female    DOB: 03-19-1956, 63 y.o.   MRN: [id number]  HPI  [name] is a 63 y/o female with a history of CAD, DM, hyperlipidemia, HTN, CKD, anemia, vitamin D deficiency, obstructive sleep apnea, osteomyelitis, depression and chronic heart failure.   Echo report from 10/02/18 reviewed and showed an EF of 45-50% along with mild MR and trivial AR but without vegetation.   Cardiac catheterization done 10/05/18 showed:   Medical management was recommended.   Prox Cx to Dist Cx lesion is 80% stenosed.  Prox LAD-1 lesion is 40% stenosed.  Prox LAD-2 lesion is 90% stenosed.  Prox LAD to Mid LAD lesion is 80% stenosed.  Mid LAD to Dist LAD lesion is 70% stenosed.  1st Diag lesion is 90% stenosed.  Ost 1st Diag lesion is 80% stenosed.  Mid RCA-1 lesion is 40% stenosed.  Mid RCA-2 lesion is 40% stenosed.  Admitted 10/30/2018 due to acute on chronic HF. Diuresed with IV lasix and then transitioned to oral diuretics. Cardiology consult was obtained. Continues on antibiotics via her PICC line. Weaned off oxygen. Discharged after 5 days. Was in the ED 10/27/2018 due to leg swelling, shortness of breath and anemia. Reported 36 pound weight gain since recent hospital discharge. Given IV lasix with good urine output and she was released. Admitted 10/12/2018 due to pneumonia. Palliative care and wound consults obtained. Treated with antibiotics. Given IV fluids due to acute kidney injury.Recent MSSA bacteremia on 6 weeks of IV Ancef. Discharged after 4 days.   She presents today for a follow-up visit with a chief complaint of moderate fatigue upon minimal exertion. She describes this as chronic in nature having been present for several years. She has associated head congestion, cough, pedal edema, difficulty sleeping and weight gain along with this. She denies any dizziness, shortness of breath, wheezing, chest pain and abdominal distention. Has been taking alternating doses  of torsemide since hospital discharge (82m BID alternating with 449mdaily)  Past Medical History:  Diagnosis Date  . Acute myocardial infarction, subendocardial infarction, initial episode of care (HCSurfside10/15/2013  . Acute osteomyelitis involving ankle and foot (HCLyons5/11/2014  . Acute systolic heart failure (HCSeaside10/15/2013   [facility]19[facility]12[facility] Elevated Troponins.  S/p 2D-echo with EF 20-25%.  S/p cardiac catheterization with stenting LAD.  Repeat 2D-echo 10/2011 with improved EF of 35%.   . Anemia   . Automatic implantable cardioverter-defibrillator in situ    a. MDT CRT-D 06/2014, SN: : [id number]    . CAD (coronary artery disease)    a. cardiac cath 101/04/2012: PCI/DES to chronically occluded mLAD, consideration PCI to diag branch in 4 weeks.   . Cataract   . Chicken pox   . Chronic kidney disease   . Chronic systolic CHF (congestive heart failure) (HCC)    a. mixed ICM & NICM; b. EF 20-25% by echo 07/2012, mid-dist 2/3 of LV sev HK/AK, mild MR. echo 10/2012: EF 30-35%, sev HK ant-septal & inf walls, GR1DD, mild MR, PASP 33. c. echo 02/2013: EF 30%, GR1DD, mild MR. echo 04/2014: EF 30%, Septal-lat dyssynchrony, global HK, inf AK, GR1DD, mild MR. d. echo 10/2014: EF50-55%, WM nl, GR1DD, septal mild paradox. e. echo 02/2015: EF 50-55%, wm   . Depression   . Heart attack (HCYork Haven  . Heel ulcer (HCRushville7/21/2016  . History of blood transfusion ~ 2011   "plasma; had neuropathy; couldn't walk"  . Hypertension   .  LBBB (left bundle branch block)   . Neuromuscular disorder ([place])   . Neuropathy 2011  . Obesity, unspecified   . OSA on CPAP    Moderate with AHI 23/hr and now on CPAP at 16cm H2O.  Her DME is [facility]  . Pure hypercholesterolemia   . Type II diabetes mellitus ([place])   . Unspecified vitamin D deficiency    Past Surgical History:  Procedure Laterality Date  . AMPUTATION TOE Right 06/18/2015   Procedure: AMPUTATION TOE;  Surgeon: Samara Deist, DPM;  Location: [facility]  ORS;  Service: Podiatry;  Laterality: Right;  . AMPUTATION TOE Left 10/08/2018   Procedure: AMPUTATION TOE LEFT 2ND;  Surgeon: Albertine Patricia, DPM;  Location: [facility] ORS;  Service: Podiatry;  Laterality: Left;  . BACK SURGERY    . BI-VENTRICULAR IMPLANTABLE CARDIOVERTER DEFIBRILLATOR N/A 07/06/2014   Procedure: BI-VENTRICULAR IMPLANTABLE CARDIOVERTER DEFIBRILLATOR  (CRT-D);  Surgeon: Deboraha Sprang, MD;  Location: [facility] CATH LAB;  Service: Cardiovascular;  Laterality: N/A;  . BI-VENTRICULAR IMPLANTABLE CARDIOVERTER DEFIBRILLATOR  (CRT-D)  07/06/2014  . BILATERAL OOPHORECTOMY  01/2011   ovarian cyst benign  . COLONOSCOPY WITH PROPOFOL Left 02/22/2015   Procedure: COLONOSCOPY WITH PROPOFOL;  Surgeon: Hulen Luster, MD;  Location: [facility] ENDOSCOPY;  Service: Endoscopy;  Laterality: Left;  . CORONARY ANGIOPLASTY WITH STENT PLACEMENT Left 07/2012   new onset systolic CHF; elevated troponins.  Cardiac catheterization with stenting to LAD; EF 15%.  2D-echo: EF 20-25%.  . ESOPHAGOGASTRODUODENOSCOPY N/A 02/22/2015   Procedure: ESOPHAGOGASTRODUODENOSCOPY (EGD);  Surgeon: Hulen Luster, MD;  Location: [facility] ENDOSCOPY;  Service: Endoscopy;  Laterality: N/A;  . INCISION AND DRAINAGE ABSCESS Right 2007   groin; with ICU stay due to sepsis.  Marland Kitchen LAPAROSCOPIC CHOLECYSTECTOMY  2011  . LEFT HEART CATH AND CORONARY ANGIOGRAPHY N/A 10/05/2018   Procedure: LEFT HEART CATH AND CORONARY ANGIOGRAPHY;  Surgeon: Minna Merritts, MD;  Location: [place] CV LAB;  Service: Cardiovascular;  Laterality: N/A;  . LEFT HEART CATHETERIZATION WITH CORONARY ANGIOGRAM N/A 07/21/2012   Procedure: LEFT HEART CATHETERIZATION WITH CORONARY ANGIOGRAM;  Surgeon: Jolaine Artist, MD;  Location: [facility] CATH LAB;  Service: Cardiovascular;  Laterality: N/A;  . [place]   L4-5  . PERCUTANEOUS CORONARY STENT INTERVENTION (PCI-S) N/A 07/23/2012   Procedure: PERCUTANEOUS CORONARY STENT INTERVENTION (PCI-S);  Surgeon: Sherren Mocha, MD;   Location[facility] CATH LAB;  Service: Cardiovascular;  Laterality: N/A;  . PERIPHERAL VASCULAR BALLOON ANGIOPLASTY Left 10/06/2018   Procedure: PERIPHERAL VASCULAR BALLOON ANGIOPLASTY;  Surgeon: Katha Cabal, MD;  Location: [place] CV LAB;  Service: Cardiovascular;  Laterality: Left;  . PERIPHERAL VASCULAR CATHETERIZATION N/A 02/10/2015   Procedure: Picc Line Insertion;  Surgeon: Katha Cabal, MD;  Location: [place] CV LAB;  Service: Cardiovascular;  Laterality: N/A;  . TEE WITHOUT CARDIOVERSION N/A 10/02/2018   Procedure: TRANSESOPHAGEAL ECHOCARDIOGRAM (TEE);  Surgeon: Wellington Hampshire, MD;  Location: [facility] ORS;  Service: Cardiovascular;  Laterality: N/A;  . [place]  . VAGINAL HYSTERECTOMY  01/2011   Fibroids/DUB.  Ovaries removed. [place].   Family History  Problem Relation Age of Onset  . Diabetes Mother   . Hypertension Mother   . Arthritis Mother        knees, lumbar DDD, cervical DDD  . Cancer Father        prostate,skin,lymphoma.  . Cancer Brother 53       bladder cancer; non-smoker  . Diabetes Maternal Grandmother   . Heart disease Maternal  Grandmother   . COPD Maternal Grandmother   . Diabetes Paternal Grandmother   . Obesity Brother   . Diabetes Son   . Hypertension Son   . Cancer Maternal Grandfather   . Diabetes Paternal Grandfather    Social History   Tobacco Use  . Smoking status: Never Smoker  . Smokeless tobacco: Never Used  Substance Use Topics  . Alcohol use: No    Alcohol/week: 0.0 standard drinks   No Known Allergies  Prior to Admission medications   Medication Sig Start Date End Date Taking? Authorizing Provider  AMBULATORY NON FORMULARY MEDICATION 1 Units by Other route once. Medication Name: foot brace 11/15/15  Yes Patel, Donika K, DO  aspirin EC 81 MG EC tablet Take 1 tablet (81 mg total) by mouth daily. 10/10/18  Yes Salary, Avel Peace, MD  Calcium Carb-Cholecalciferol (CALCIUM 600 + D PO) Take 1 tablet by mouth 2 (two)  times daily.   Yes [provider]  carvedilol (COREG) 12.5 MG tablet Take 1 tablet (12.5 mg total) by mouth 2 (two) times daily with a meal. 10/09/18  Yes Salary, Montell D, MD  ceFAZolin (ANCEF) IVPB Inject 2 g into the vein every 8 (eight) hours. Indication: MSSA bacteremia and toe infection Last Day of Therapy:  11/10/2018 Labs - Once weekly:  CBC/D and BMP, Labs - Every other week:  ESR and CRP 10/16/18 11/16/18 Yes Wieting, Richard, MD  citalopram (CELEXA) 20 MG tablet Take 1 tablet (20 mg total) by mouth daily. 04/15/18  Yes Wardell Honour, MD  clopidogrel (PLAVIX) 75 MG tablet Take 1 tablet (75 mg total) by mouth daily. 10/10/18  Yes Salary, Montell D, MD  ferrous sulfate 325 (65 FE) MG tablet Take 325 mg by mouth daily.   Yes [provider]  glimepiride (AMARYL) 4 MG tablet Take 1 tablet (4 mg total) by mouth 2 (two) times daily. 10/22/18  Yes Rutherford Guys, MD  glucose blood (ONE TOUCH ULTRA TEST) test strip Check sugar three times daily dx: DMII uncontrolled insulin dependent with complications 05/18/74  Yes Wardell Honour, MD  insulin glargine (LANTUS) 100 UNIT/ML injection Inject 0.16 mLs (16 Units total) into the skin at bedtime. 10/16/18  Yes Wieting, Richard, MD  Insulin Pen Needle (B-D ULTRAFINE III SHORT PEN) 31G X 8 MM MISC USE AS DIRECTED WITH LANTUS; dx: DMII uncontrolled, insulin dependent, with complications 1/70/01  Yes Wardell Honour, MD  losartan (COZAAR) 50 MG tablet Take 1 tablet (50 mg total) by mouth daily for 30 days. 11/04/18 12/04/18 Yes Pyreddy, Reatha Harps, MD  nitroGLYCERIN (NITROSTAT) 0.4 MG SL tablet Place 1 tablet (0.4 mg total) under the tongue every 5 (five) minutes as needed for chest pain. 10/09/18  Yes Gollan, Kathlene November, MD  omega-3 acid ethyl esters (LOVAZA) 1 g capsule Take 1 g by mouth 2 (two) times daily.   Yes [provider]  rosuvastatin (CRESTOR) 40 MG tablet TAKE 1 TABLET BY MOUTH ONCE DAILY Patient taking differently: Take 40 mg by  mouth at bedtime.  12/16/17  Yes Gollan, Kathlene November, MD  sodium chloride flush (NS) 0.9 % SOLN 20 mLs by Intracatheter route every 8 (eight) hours. 10/16/18  Yes Loletha Grayer, MD  torsemide (DEMADEX) 20 MG tablet Take 40 mg orally twice daily alternating with 40 mg once daily 11/04/18  Yes Pyreddy, Pavan, MD    Review of Systems  Constitutional: Positive for fatigue (easily). Negative for appetite change.  HENT: Positive for congestion. Negative for  rhinorrhea and sore throat.   Eyes: Negative.   Respiratory: Positive for cough. Negative for shortness of breath and wheezing.   Cardiovascular: Positive for leg swelling. Negative for chest pain and palpitations.  Gastrointestinal: Negative for abdominal distention and abdominal pain.  Endocrine: Negative.   Genitourinary: Negative.   Musculoskeletal: Positive for back pain. Negative for neck pain.  Skin: Positive for wound (left 2nd toe amputated).  Allergic/Immunologic: Negative.   Neurological: Negative for dizziness and light-headedness.  Hematological: Negative for adenopathy. Does not bruise/bleed easily.  Psychiatric/Behavioral: Positive for sleep disturbance (sleeping on 1 pillow with oxygen @ 2L). Negative for dysphoric mood. The patient is not nervous/anxious.    Vitals:   11/10/18 1155  BP: (!) 123/52  Pulse: 65  Resp: 18  SpO2: 96%  Weight: 292 lb 6 oz (132.6 kg)  Height: _0  (1.727 m)   Wt Readings from Last 3 Encounters:  11/10/18 292 lb 6 oz (132.6 kg)  11/09/18 293 lb 12.8 oz (133.3 kg)  11/09/18 291 lb (132 kg)   Lab Results  Component Value Date   CREATININE 1.13 (H) 11/04/2018   CREATININE 1.06 (H) 11/03/2018   CREATININE 1.23 (H) 11/02/2018    Physical Exam Vitals signs and nursing note reviewed.  Constitutional:      Appearance: She is well-developed.  HENT:     Head: Normocephalic and atraumatic.  Neck:     Musculoskeletal: Normal range of motion and neck supple.  Cardiovascular:     Rate and  Rhythm: Normal rate and regular rhythm.  Pulmonary:     Effort: Pulmonary effort is normal. No respiratory distress.     Breath sounds: No rhonchi or rales.  Abdominal:     General: There is no distension.     Palpations: Abdomen is soft.  Musculoskeletal:     Right lower leg: She exhibits no tenderness. Edema (3+ pitting although softer) present.     Left lower leg: She exhibits no tenderness. Edema (3+ pitting although softer) present.  Skin:    General: Skin is warm and dry.  Neurological:     General: No focal deficit present.     Mental Status: She is alert and oriented to person, place, and time.  Psychiatric:        Mood and Affect: Mood normal.        Behavior: Behavior normal.    Assessment & Plan:  1: Acute on Chronic heart failure with mildly reduced ejection fraction- - NYHA class III - moderately fluid overloaded with increasing edema - weighing daily and she was instructed to call for an overnight weight gain of >2 pounds or a weekly weight gain of >5 pounds - weight down 7 pounds from last visit here 2 weeks ago although her home weight is rising since hospital discharge - will give 43m IV lasix/ 475m PO potassium today - will get BMP/ BNP today as well.  - will also increase her torsemide to 4022mID - depending on lab results today, may need to recheck labs again tomorrow when she returns.  - doesn't add salt to her food (except tomatoes) but admits that she often eats salty foods. When her daughter does the cooking, she will fix macaroni and cheese, fried weenies etc and patient is aware that those foods are higher in sodium - saw EP (KlCaryl Comes/21/2020  - sees cardiology ([place]) 11/26/2018 - BNP 10/30/2018 was 1949.0 - she says that she's received her flu vaccine for this season - wears oxygen @  2L at bedtime  2: HTN- - BP looks good today - saw PCP Pamella Pert) 11/09/2018 - BMP from 11/04/2018 reviewed and showed sodium 138, potassium 3.6, creatinine 1.13 and GFR  51  3: DM- - A1c 10/22/2018 was 7.8% - glucose today was 134  4: Lymphedema- - stage 2 - does elevate her legs but says that her edema continues - has tried wearing support socks but says that they cut into her leg behind her knee causing pain - currently unable to exercise due to symptoms - consider lymphapress compression boots if symptoms persists; will need to wait the 30 days for insurance approval (1st visit was 10/28/2018)  Medication list was reviewed.  Return tomorrow for a recheck of symptoms

## 2018-11-10 NOTE — Telephone Encounter (Signed)
Copied from [place]. Topic: Quick Communication - Home Health Verbal Orders >> Nov 10, 2018 11:20 AM Alfredia Ferguson R wrote: Caller/Agency: [facility] Number: [phone] Requesting OT/PT/Skilled Nursing/Social Work: PT Frequency: 2 week 2 , 1 week 1  Patient is still having weight gain she was up to 291 today , will be going to heart failure clinic today

## 2018-11-11 ENCOUNTER — Ambulatory Visit: Payer: PPO | Attending: Family | Admitting: Family

## 2018-11-11 ENCOUNTER — Telehealth: Payer: Self-pay | Admitting: Cardiovascular Disease

## 2018-11-11 ENCOUNTER — Telehealth: Payer: Self-pay | Admitting: Family Medicine

## 2018-11-11 ENCOUNTER — Encounter: Payer: Self-pay | Admitting: Family

## 2018-11-11 VITALS — BP 119/46 | HR 66 | Resp 18 | Ht 68.0 in | Wt 292.5 lb

## 2018-11-11 DIAGNOSIS — F329 Major depressive disorder, single episode, unspecified: Secondary | ICD-10-CM | POA: Insufficient documentation

## 2018-11-11 DIAGNOSIS — Z7902 Long term (current) use of antithrombotics/antiplatelets: Secondary | ICD-10-CM | POA: Insufficient documentation

## 2018-11-11 DIAGNOSIS — Z89422 Acquired absence of other left toe(s): Secondary | ICD-10-CM | POA: Insufficient documentation

## 2018-11-11 DIAGNOSIS — G4733 Obstructive sleep apnea (adult) (pediatric): Secondary | ICD-10-CM | POA: Insufficient documentation

## 2018-11-11 DIAGNOSIS — Z8249 Family history of ischemic heart disease and other diseases of the circulatory system: Secondary | ICD-10-CM | POA: Insufficient documentation

## 2018-11-11 DIAGNOSIS — Z79899 Other long term (current) drug therapy: Secondary | ICD-10-CM | POA: Diagnosis not present

## 2018-11-11 DIAGNOSIS — E1136 Type 2 diabetes mellitus with diabetic cataract: Secondary | ICD-10-CM | POA: Insufficient documentation

## 2018-11-11 DIAGNOSIS — Z794 Long term (current) use of insulin: Secondary | ICD-10-CM | POA: Diagnosis not present

## 2018-11-11 DIAGNOSIS — I5022 Chronic systolic (congestive) heart failure: Secondary | ICD-10-CM

## 2018-11-11 DIAGNOSIS — Z89421 Acquired absence of other right toe(s): Secondary | ICD-10-CM | POA: Diagnosis not present

## 2018-11-11 DIAGNOSIS — N189 Chronic kidney disease, unspecified: Secondary | ICD-10-CM | POA: Diagnosis not present

## 2018-11-11 DIAGNOSIS — E669 Obesity, unspecified: Secondary | ICD-10-CM | POA: Insufficient documentation

## 2018-11-11 DIAGNOSIS — I447 Left bundle-branch block, unspecified: Secondary | ICD-10-CM | POA: Insufficient documentation

## 2018-11-11 DIAGNOSIS — Z808 Family history of malignant neoplasm of other organs or systems: Secondary | ICD-10-CM | POA: Insufficient documentation

## 2018-11-11 DIAGNOSIS — I10 Essential (primary) hypertension: Secondary | ICD-10-CM

## 2018-11-11 DIAGNOSIS — E7800 Pure hypercholesterolemia, unspecified: Secondary | ICD-10-CM | POA: Insufficient documentation

## 2018-11-11 DIAGNOSIS — Z7982 Long term (current) use of aspirin: Secondary | ICD-10-CM | POA: Diagnosis not present

## 2018-11-11 DIAGNOSIS — E1122 Type 2 diabetes mellitus with diabetic chronic kidney disease: Secondary | ICD-10-CM | POA: Diagnosis not present

## 2018-11-11 DIAGNOSIS — E559 Vitamin D deficiency, unspecified: Secondary | ICD-10-CM | POA: Insufficient documentation

## 2018-11-11 DIAGNOSIS — Z955 Presence of coronary angioplasty implant and graft: Secondary | ICD-10-CM | POA: Insufficient documentation

## 2018-11-11 DIAGNOSIS — I252 Old myocardial infarction: Secondary | ICD-10-CM | POA: Insufficient documentation

## 2018-11-11 DIAGNOSIS — I890 Lymphedema, not elsewhere classified: Secondary | ICD-10-CM | POA: Insufficient documentation

## 2018-11-11 DIAGNOSIS — D649 Anemia, unspecified: Secondary | ICD-10-CM | POA: Insufficient documentation

## 2018-11-11 DIAGNOSIS — Z95810 Presence of automatic (implantable) cardiac defibrillator: Secondary | ICD-10-CM | POA: Insufficient documentation

## 2018-11-11 DIAGNOSIS — I130 Hypertensive heart and chronic kidney disease with heart failure and stage 1 through stage 4 chronic kidney disease, or unspecified chronic kidney disease: Secondary | ICD-10-CM | POA: Insufficient documentation

## 2018-11-11 DIAGNOSIS — I2510 Atherosclerotic heart disease of native coronary artery without angina pectoris: Secondary | ICD-10-CM | POA: Insufficient documentation

## 2018-11-11 DIAGNOSIS — L97409 Non-pressure chronic ulcer of unspecified heel and midfoot with unspecified severity: Secondary | ICD-10-CM | POA: Insufficient documentation

## 2018-11-11 DIAGNOSIS — Z807 Family history of other malignant neoplasms of lymphoid, hematopoietic and related tissues: Secondary | ICD-10-CM | POA: Insufficient documentation

## 2018-11-11 DIAGNOSIS — H269 Unspecified cataract: Secondary | ICD-10-CM | POA: Insufficient documentation

## 2018-11-11 DIAGNOSIS — E1140 Type 2 diabetes mellitus with diabetic neuropathy, unspecified: Secondary | ICD-10-CM | POA: Insufficient documentation

## 2018-11-11 DIAGNOSIS — E1142 Type 2 diabetes mellitus with diabetic polyneuropathy: Secondary | ICD-10-CM

## 2018-11-11 DIAGNOSIS — Z6841 Body Mass Index (BMI) 40.0 and over, adult: Secondary | ICD-10-CM | POA: Diagnosis not present

## 2018-11-11 DIAGNOSIS — Z8052 Family history of malignant neoplasm of bladder: Secondary | ICD-10-CM | POA: Insufficient documentation

## 2018-11-11 DIAGNOSIS — Z8042 Family history of malignant neoplasm of prostate: Secondary | ICD-10-CM | POA: Insufficient documentation

## 2018-11-11 DIAGNOSIS — E11622 Type 2 diabetes mellitus with other skin ulcer: Secondary | ICD-10-CM | POA: Insufficient documentation

## 2018-11-11 DIAGNOSIS — Z833 Family history of diabetes mellitus: Secondary | ICD-10-CM | POA: Insufficient documentation

## 2018-11-11 LAB — BASIC METABOLIC PANEL
Anion gap: 8 (ref 5–15)
BUN: 43 mg/dL — ABNORMAL HIGH (ref 8–23)
CO2: 31 mmol/L (ref 22–32)
Calcium: 8.2 mg/dL — ABNORMAL LOW (ref 8.9–10.3)
Chloride: 100 mmol/L (ref 98–111)
Creatinine, Ser: 1.53 mg/dL — ABNORMAL HIGH (ref 0.44–1.00)
GFR calc Af Amer: 41 mL/min — ABNORMAL LOW (ref >60)
GFR calc non Af Amer: 36 mL/min — ABNORMAL LOW (ref >60)
Glucose, Bld: 142 mg/dL — ABNORMAL HIGH (ref 70–99)
Potassium: 4.1 mmol/L (ref 3.5–5.1)
Sodium: 139 mmol/L (ref 135–145)

## 2018-11-11 NOTE — Telephone Encounter (Signed)
I am okay to sign She may also benefit from CHF clinic follow-up

## 2018-11-11 NOTE — Telephone Encounter (Signed)
Verbal order given  

## 2018-11-11 NOTE — Telephone Encounter (Signed)
Called to give verbal orders for PT

## 2018-11-11 NOTE — Patient Instructions (Signed)
Continue weighing daily and call for an overnight weight gain of > 2 pounds or a weekly weight gain of >5 pounds. 

## 2018-11-11 NOTE — Telephone Encounter (Unsigned)
Copied from [place]. Topic: Quick Communication - Home Health Verbal Orders >> Nov 10, 2018 11:20 AM Alfredia Ferguson R wrote: Caller/Agency: [facility] Number: [phone] Requesting OT/PT/Skilled Nursing/Social Work: PT Frequency: 2 week 2 , 1 week 1  Patient is still having weight gain she was up to 291 today , will be going to heart failure clinic today >> Nov 11, 2018 10:27 AM Carolyn Stare wrote:   Cal lto fup on req for orders and said delay in orders can send pt back to hospital

## 2018-11-11 NOTE — Telephone Encounter (Signed)
No answer. Left message with Raquel Sarna at [facility] that PT orders would be better suited to come from patient's PCP. Encouraged her to call us back if any further questions.

## 2018-11-11 NOTE — Telephone Encounter (Signed)
Gave verbal orders for skilled nursing.

## 2018-11-11 NOTE — Telephone Encounter (Signed)
[name] from [facility] calling  States she would like to know if Dr Rockey Situ can sign orders for physical therapy Twice a week for 2 weeks and once a week for 1 week Please call to discuss, ok to leave a message

## 2018-11-12 ENCOUNTER — Other Ambulatory Visit: Payer: Self-pay | Admitting: *Deleted

## 2018-11-12 ENCOUNTER — Encounter: Payer: Self-pay | Admitting: *Deleted

## 2018-11-12 ENCOUNTER — Other Ambulatory Visit: Payer: Self-pay | Admitting: Cardiovascular Disease

## 2018-11-12 ENCOUNTER — Telehealth: Payer: Self-pay | Admitting: Family

## 2018-11-12 NOTE — Telephone Encounter (Signed)
Spoke with patient regarding lab work from yesterday (11/11/2018). Advised that her potassium level was normal but that her renal function was still low.   She still had quite a bit of edema on exam yesterday so will not decrease her torsemide. Will decrease her losartan to 25mg  daily. Instructed her to cut her current tablet in half and take 1/2 tablet daily.   She sees Christell Faith 11/26/2018 so will message him about rechecking her lab work at that visit.   Patient verbalized understanding and was comfortable with this plan.

## 2018-11-12 NOTE — Patient Outreach (Signed)
Telephone call to [name] to verify her status with Palliative Care. She acknowledges that she has agreed to participate in their services. She also has [facility] providing services at this time also. She will therefore not require [facility] Care Management servcies.   I have advised that I will still send her our information for any future need.  [name]. [name], MSN, [facility] Gerontological Nurse Practitioner [facility] Care Management [phone]

## 2018-11-12 NOTE — Telephone Encounter (Signed)
No answer with home health nurse. Left detailed message that Dr Rockey Situ will sign off on the home health orders and to call back with any further questions.  Patient also had HF clinic appointment yesterday with Otila Kluver and follow up scheduled with her in a few weeks as well.

## 2018-11-16 NOTE — Patient Outreach (Signed)
Mrs. Pouncey is home from the hospital and has elected to have palliative care services, therefore, [facility] Management services are not required for her at this time. She appreciated the conversations we had and the information offered.  [name]. [name], MSN, [facility] Gerontological Nurse Practitioner [facility] Care Management [phone]

## 2018-11-17 ENCOUNTER — Encounter: Payer: Self-pay | Admitting: Infectious Diseases

## 2018-11-17 DIAGNOSIS — E1169 Type 2 diabetes mellitus with other specified complication: Secondary | ICD-10-CM | POA: Diagnosis not present

## 2018-11-17 DIAGNOSIS — M86172 Other acute osteomyelitis, left ankle and foot: Secondary | ICD-10-CM | POA: Diagnosis not present

## 2018-11-17 DIAGNOSIS — B9561 Methicillin susceptible Staphylococcus aureus infection as the cause of diseases classified elsewhere: Secondary | ICD-10-CM | POA: Diagnosis not present

## 2018-11-18 ENCOUNTER — Encounter: Payer: Self-pay | Admitting: Family Medicine

## 2018-11-19 ENCOUNTER — Other Ambulatory Visit: Payer: Self-pay | Admitting: *Deleted

## 2018-11-19 DIAGNOSIS — B379 Candidiasis, unspecified: Secondary | ICD-10-CM

## 2018-11-19 MED ORDER — FLUCONAZOLE 150 MG PO TABS: 150.0000 mg | ORAL_TABLET | Freq: Once | ORAL | 0 refills | Status: AC

## 2018-11-22 ENCOUNTER — Inpatient Hospital Stay
Admission: EM | Admit: 2018-11-22 | Discharge: 2018-11-29 | DRG: 280 | Disposition: A | Discharge status: Other Acute Inpatient Hospital | Payer: PPO | Attending: Internal Medicine | Admitting: Internal Medicine

## 2018-11-22 ENCOUNTER — Inpatient Hospital Stay: Payer: PPO

## 2018-11-22 ENCOUNTER — Encounter: Payer: Self-pay | Admitting: Emergency Medicine

## 2018-11-22 ENCOUNTER — Other Ambulatory Visit: Payer: Self-pay

## 2018-11-22 ENCOUNTER — Emergency Department: Payer: PPO

## 2018-11-22 DIAGNOSIS — G2581 Restless legs syndrome: Secondary | ICD-10-CM | POA: Diagnosis present

## 2018-11-22 DIAGNOSIS — G92 Toxic encephalopathy: Secondary | ICD-10-CM | POA: Diagnosis present

## 2018-11-22 DIAGNOSIS — I2510 Atherosclerotic heart disease of native coronary artery without angina pectoris: Secondary | ICD-10-CM | POA: Diagnosis not present

## 2018-11-22 DIAGNOSIS — R4182 Altered mental status, unspecified: Secondary | ICD-10-CM

## 2018-11-22 DIAGNOSIS — Z833 Family history of diabetes mellitus: Secondary | ICD-10-CM

## 2018-11-22 DIAGNOSIS — I480 Paroxysmal atrial fibrillation: Secondary | ICD-10-CM | POA: Diagnosis not present

## 2018-11-22 DIAGNOSIS — I214 Non-ST elevation (NSTEMI) myocardial infarction: Secondary | ICD-10-CM | POA: Diagnosis not present

## 2018-11-22 DIAGNOSIS — D631 Anemia in chronic kidney disease: Secondary | ICD-10-CM | POA: Diagnosis not present

## 2018-11-22 DIAGNOSIS — Y92230 Patient room in hospital as the place of occurrence of the external cause: Secondary | ICD-10-CM | POA: Diagnosis not present

## 2018-11-22 DIAGNOSIS — E1140 Type 2 diabetes mellitus with diabetic neuropathy, unspecified: Secondary | ICD-10-CM | POA: Diagnosis present

## 2018-11-22 DIAGNOSIS — Z9981 Dependence on supplemental oxygen: Secondary | ICD-10-CM

## 2018-11-22 DIAGNOSIS — J9621 Acute and chronic respiratory failure with hypoxia: Secondary | ICD-10-CM | POA: Diagnosis present

## 2018-11-22 DIAGNOSIS — E119 Type 2 diabetes mellitus without complications: Secondary | ICD-10-CM | POA: Diagnosis not present

## 2018-11-22 DIAGNOSIS — I499 Cardiac arrhythmia, unspecified: Secondary | ICD-10-CM | POA: Diagnosis not present

## 2018-11-22 DIAGNOSIS — E669 Obesity, unspecified: Secondary | ICD-10-CM | POA: Diagnosis present

## 2018-11-22 DIAGNOSIS — R410 Disorientation, unspecified: Secondary | ICD-10-CM | POA: Diagnosis not present

## 2018-11-22 DIAGNOSIS — I5022 Chronic systolic (congestive) heart failure: Secondary | ICD-10-CM | POA: Diagnosis not present

## 2018-11-22 DIAGNOSIS — R7881 Bacteremia: Secondary | ICD-10-CM | POA: Diagnosis not present

## 2018-11-22 DIAGNOSIS — I252 Old myocardial infarction: Secondary | ICD-10-CM

## 2018-11-22 DIAGNOSIS — A4101 Sepsis due to Methicillin susceptible Staphylococcus aureus: Secondary | ICD-10-CM | POA: Diagnosis not present

## 2018-11-22 DIAGNOSIS — Y95 Nosocomial condition: Secondary | ICD-10-CM | POA: Diagnosis present

## 2018-11-22 DIAGNOSIS — G4733 Obstructive sleep apnea (adult) (pediatric): Secondary | ICD-10-CM | POA: Diagnosis present

## 2018-11-22 DIAGNOSIS — Z8673 Personal history of transient ischemic attack (TIA), and cerebral infarction without residual deficits: Secondary | ICD-10-CM

## 2018-11-22 DIAGNOSIS — I25118 Atherosclerotic heart disease of native coronary artery with other forms of angina pectoris: Secondary | ICD-10-CM | POA: Diagnosis not present

## 2018-11-22 DIAGNOSIS — I255 Ischemic cardiomyopathy: Secondary | ICD-10-CM | POA: Diagnosis not present

## 2018-11-22 DIAGNOSIS — Z89422 Acquired absence of other left toe(s): Secondary | ICD-10-CM | POA: Diagnosis not present

## 2018-11-22 DIAGNOSIS — I340 Nonrheumatic mitral (valve) insufficiency: Secondary | ICD-10-CM | POA: Diagnosis not present

## 2018-11-22 DIAGNOSIS — Z7982 Long term (current) use of aspirin: Secondary | ICD-10-CM

## 2018-11-22 DIAGNOSIS — Z8042 Family history of malignant neoplasm of prostate: Secondary | ICD-10-CM

## 2018-11-22 DIAGNOSIS — Z9114 Patient's other noncompliance with medication regimen: Secondary | ICD-10-CM

## 2018-11-22 DIAGNOSIS — Z89421 Acquired absence of other right toe(s): Secondary | ICD-10-CM

## 2018-11-22 DIAGNOSIS — Z90710 Acquired absence of both cervix and uterus: Secondary | ICD-10-CM

## 2018-11-22 DIAGNOSIS — R000 Tachycardia, unspecified: Secondary | ICD-10-CM | POA: Diagnosis not present

## 2018-11-22 DIAGNOSIS — Z6841 Body Mass Index (BMI) 40.0 and over, adult: Secondary | ICD-10-CM | POA: Diagnosis not present

## 2018-11-22 DIAGNOSIS — N189 Chronic kidney disease, unspecified: Secondary | ICD-10-CM | POA: Diagnosis not present

## 2018-11-22 DIAGNOSIS — Z90722 Acquired absence of ovaries, bilateral: Secondary | ICD-10-CM

## 2018-11-22 DIAGNOSIS — T827XXA Infection and inflammatory reaction due to other cardiac and vascular devices, implants and grafts, initial encounter: Principal | ICD-10-CM | POA: Diagnosis present

## 2018-11-22 DIAGNOSIS — Z7902 Long term (current) use of antithrombotics/antiplatelets: Secondary | ICD-10-CM

## 2018-11-22 DIAGNOSIS — Z8249 Family history of ischemic heart disease and other diseases of the circulatory system: Secondary | ICD-10-CM

## 2018-11-22 DIAGNOSIS — D696 Thrombocytopenia, unspecified: Secondary | ICD-10-CM | POA: Diagnosis not present

## 2018-11-22 DIAGNOSIS — G9340 Encephalopathy, unspecified: Secondary | ICD-10-CM | POA: Diagnosis not present

## 2018-11-22 DIAGNOSIS — Z955 Presence of coronary angioplasty implant and graft: Secondary | ICD-10-CM

## 2018-11-22 DIAGNOSIS — A419 Sepsis, unspecified organism: Secondary | ICD-10-CM | POA: Diagnosis not present

## 2018-11-22 DIAGNOSIS — Z8261 Family history of arthritis: Secondary | ICD-10-CM

## 2018-11-22 DIAGNOSIS — D6959 Other secondary thrombocytopenia: Secondary | ICD-10-CM | POA: Diagnosis present

## 2018-11-22 DIAGNOSIS — J9690 Respiratory failure, unspecified, unspecified whether with hypoxia or hypercapnia: Secondary | ICD-10-CM

## 2018-11-22 DIAGNOSIS — J189 Pneumonia, unspecified organism: Secondary | ICD-10-CM | POA: Diagnosis not present

## 2018-11-22 DIAGNOSIS — I130 Hypertensive heart and chronic kidney disease with heart failure and stage 1 through stage 4 chronic kidney disease, or unspecified chronic kidney disease: Secondary | ICD-10-CM | POA: Diagnosis not present

## 2018-11-22 DIAGNOSIS — Z808 Family history of malignant neoplasm of other organs or systems: Secondary | ICD-10-CM

## 2018-11-22 DIAGNOSIS — R579 Shock, unspecified: Secondary | ICD-10-CM

## 2018-11-22 DIAGNOSIS — I429 Cardiomyopathy, unspecified: Secondary | ICD-10-CM | POA: Diagnosis not present

## 2018-11-22 DIAGNOSIS — Z807 Family history of other malignant neoplasms of lymphoid, hematopoietic and related tissues: Secondary | ICD-10-CM

## 2018-11-22 DIAGNOSIS — N170 Acute kidney failure with tubular necrosis: Secondary | ICD-10-CM | POA: Diagnosis not present

## 2018-11-22 DIAGNOSIS — R0902 Hypoxemia: Secondary | ICD-10-CM | POA: Diagnosis not present

## 2018-11-22 DIAGNOSIS — E876 Hypokalemia: Secondary | ICD-10-CM | POA: Diagnosis not present

## 2018-11-22 DIAGNOSIS — I248 Other forms of acute ischemic heart disease: Secondary | ICD-10-CM

## 2018-11-22 DIAGNOSIS — E1122 Type 2 diabetes mellitus with diabetic chronic kidney disease: Secondary | ICD-10-CM | POA: Diagnosis not present

## 2018-11-22 DIAGNOSIS — Z79899 Other long term (current) drug therapy: Secondary | ICD-10-CM

## 2018-11-22 DIAGNOSIS — R404 Transient alteration of awareness: Secondary | ICD-10-CM | POA: Diagnosis not present

## 2018-11-22 DIAGNOSIS — J9601 Acute respiratory failure with hypoxia: Secondary | ICD-10-CM | POA: Diagnosis not present

## 2018-11-22 DIAGNOSIS — R6521 Severe sepsis with septic shock: Secondary | ICD-10-CM | POA: Diagnosis present

## 2018-11-22 DIAGNOSIS — N179 Acute kidney failure, unspecified: Secondary | ICD-10-CM

## 2018-11-22 DIAGNOSIS — N183 Chronic kidney disease, stage 3 (moderate): Secondary | ICD-10-CM | POA: Diagnosis present

## 2018-11-22 DIAGNOSIS — Z95810 Presence of automatic (implantable) cardiac defibrillator: Secondary | ICD-10-CM | POA: Diagnosis not present

## 2018-11-22 DIAGNOSIS — Z560 Unemployment, unspecified: Secondary | ICD-10-CM

## 2018-11-22 DIAGNOSIS — E785 Hyperlipidemia, unspecified: Secondary | ICD-10-CM | POA: Diagnosis present

## 2018-11-22 DIAGNOSIS — R571 Hypovolemic shock: Secondary | ICD-10-CM | POA: Diagnosis not present

## 2018-11-22 DIAGNOSIS — E1165 Type 2 diabetes mellitus with hyperglycemia: Secondary | ICD-10-CM | POA: Diagnosis not present

## 2018-11-22 DIAGNOSIS — I509 Heart failure, unspecified: Secondary | ICD-10-CM | POA: Diagnosis not present

## 2018-11-22 DIAGNOSIS — Z9049 Acquired absence of other specified parts of digestive tract: Secondary | ICD-10-CM

## 2018-11-22 DIAGNOSIS — Z452 Encounter for adjustment and management of vascular access device: Secondary | ICD-10-CM

## 2018-11-22 DIAGNOSIS — B9561 Methicillin susceptible Staphylococcus aureus infection as the cause of diseases classified elsewhere: Secondary | ICD-10-CM | POA: Diagnosis not present

## 2018-11-22 DIAGNOSIS — Z8701 Personal history of pneumonia (recurrent): Secondary | ICD-10-CM

## 2018-11-22 DIAGNOSIS — Z794 Long term (current) use of insulin: Secondary | ICD-10-CM

## 2018-11-22 DIAGNOSIS — R918 Other nonspecific abnormal finding of lung field: Secondary | ICD-10-CM | POA: Diagnosis not present

## 2018-11-22 DIAGNOSIS — Z9851 Tubal ligation status: Secondary | ICD-10-CM

## 2018-11-22 DIAGNOSIS — A4902 Methicillin resistant Staphylococcus aureus infection, unspecified site: Secondary | ICD-10-CM | POA: Diagnosis not present

## 2018-11-22 DIAGNOSIS — Z8052 Family history of malignant neoplasm of bladder: Secondary | ICD-10-CM

## 2018-11-22 DIAGNOSIS — T426X6A Underdosing of other antiepileptic and sedative-hypnotic drugs, initial encounter: Secondary | ICD-10-CM | POA: Diagnosis not present

## 2018-11-22 LAB — BLOOD CULTURE ID PANEL (REFLEXED)
Acinetobacter baumannii: NOT DETECTED
CANDIDA ALBICANS: NOT DETECTED
Candida glabrata: NOT DETECTED
Candida krusei: NOT DETECTED
Candida parapsilosis: NOT DETECTED
Candida tropicalis: NOT DETECTED
ENTEROBACTERIACEAE SPECIES: NOT DETECTED
Enterobacter cloacae complex: NOT DETECTED
Enterococcus species: NOT DETECTED
Escherichia coli: NOT DETECTED
HAEMOPHILUS INFLUENZAE: NOT DETECTED
Klebsiella oxytoca: NOT DETECTED
Klebsiella pneumoniae: NOT DETECTED
Listeria monocytogenes: NOT DETECTED
METHICILLIN RESISTANCE: NOT DETECTED
Neisseria meningitidis: NOT DETECTED
Proteus species: NOT DETECTED
Pseudomonas aeruginosa: NOT DETECTED
Serratia marcescens: NOT DETECTED
Staphylococcus aureus (BCID): DETECTED — AB
Staphylococcus species: DETECTED — AB
Streptococcus agalactiae: NOT DETECTED
Streptococcus pneumoniae: NOT DETECTED
Streptococcus pyogenes: NOT DETECTED
Streptococcus species: NOT DETECTED

## 2018-11-22 LAB — CBC WITH DIFFERENTIAL/PLATELET
Abs Immature Granulocytes: 0.03 10*3/uL (ref 0.00–0.07)
BASOS ABS: 0 10*3/uL (ref 0.0–0.1)
Basophils Relative: 0 %
EOS PCT: 3 %
Eosinophils Absolute: 0.1 10*3/uL (ref 0.0–0.5)
HCT: 22.4 % — ABNORMAL LOW (ref 36.0–46.0)
Hemoglobin: 7 g/dL — ABNORMAL LOW (ref 12.0–15.0)
IMMATURE GRANULOCYTES: 1 %
Lymphocytes Relative: 6 %
Lymphs Abs: 0.3 10*3/uL — ABNORMAL LOW (ref 0.7–4.0)
MCH: 28.7 pg (ref 26.0–34.0)
MCHC: 31.3 g/dL (ref 30.0–36.0)
MCV: 91.8 fL (ref 80.0–100.0)
Monocytes Absolute: 0.3 10*3/uL (ref 0.1–1.0)
Monocytes Relative: 6 %
Neutro Abs: 4.1 10*3/uL (ref 1.7–7.7)
Neutrophils Relative %: 84 %
Platelets: 84 10*3/uL — ABNORMAL LOW (ref 150–400)
RBC: 2.44 MIL/uL — ABNORMAL LOW (ref 3.87–5.11)
RDW: 13.9 % (ref 11.5–15.5)
WBC: 4.8 10*3/uL (ref 4.0–10.5)
nRBC: 0 % (ref 0.0–0.2)

## 2018-11-22 LAB — GLUCOSE, CAPILLARY
Glucose-Capillary: 279 mg/dL — ABNORMAL HIGH (ref 70–99)
Glucose-Capillary: 288 mg/dL — ABNORMAL HIGH (ref 70–99)

## 2018-11-22 LAB — BRAIN NATRIURETIC PEPTIDE: B Natriuretic Peptide: 2246 pg/mL — ABNORMAL HIGH (ref 0.0–100.0)

## 2018-11-22 LAB — PROCALCITONIN: Procalcitonin: 55.64 ng/mL

## 2018-11-22 LAB — COMPREHENSIVE METABOLIC PANEL
ALT: 33 U/L (ref 0–44)
AST: 197 U/L — ABNORMAL HIGH (ref 15–41)
Albumin: 2.9 g/dL — ABNORMAL LOW (ref 3.5–5.0)
Alkaline Phosphatase: 43 U/L (ref 38–126)
Anion gap: 12 (ref 5–15)
BUN: 58 mg/dL — AB (ref 8–23)
CO2: 27 mmol/L (ref 22–32)
Calcium: 7.5 mg/dL — ABNORMAL LOW (ref 8.9–10.3)
Chloride: 93 mmol/L — ABNORMAL LOW (ref 98–111)
Creatinine, Ser: 3.73 mg/dL — ABNORMAL HIGH (ref 0.44–1.00)
GFR calc Af Amer: 14 mL/min — ABNORMAL LOW (ref >60)
GFR calc non Af Amer: 12 mL/min — ABNORMAL LOW (ref >60)
Glucose, Bld: 251 mg/dL — ABNORMAL HIGH (ref 70–99)
POTASSIUM: 4.1 mmol/L (ref 3.5–5.1)
Sodium: 132 mmol/L — ABNORMAL LOW (ref 135–145)
Total Bilirubin: 0.8 mg/dL (ref 0.3–1.2)
Total Protein: 6.6 g/dL (ref 6.5–8.1)

## 2018-11-22 LAB — APTT: aPTT: 38 seconds — ABNORMAL HIGH (ref 24–36)

## 2018-11-22 LAB — AMMONIA: Ammonia: 26 umol/L (ref 9–35)

## 2018-11-22 LAB — IRON AND TIBC
Iron: 11 ug/dL — ABNORMAL LOW (ref 28–170)
SATURATION RATIOS: 6 % — AB (ref 10.4–31.8)
TIBC: 184 ug/dL — ABNORMAL LOW (ref 250–450)
UIBC: 173 ug/dL

## 2018-11-22 LAB — RETICULOCYTES
Immature Retic Fract: 18 % — ABNORMAL HIGH (ref 2.3–15.9)
RBC.: 3.36 MIL/uL — ABNORMAL LOW (ref 3.87–5.11)
Retic Count, Absolute: 100.5 10*3/uL (ref 19.0–186.0)
Retic Ct Pct: 3 % (ref 0.4–3.1)

## 2018-11-22 LAB — LACTIC ACID, PLASMA
Lactic Acid, Venous: 2 mmol/L (ref 0.5–1.9)
Lactic Acid, Venous: 1.7 mmol/L (ref 0.5–1.9)

## 2018-11-22 LAB — HEMOGLOBIN AND HEMATOCRIT, BLOOD
HCT: 30 % — ABNORMAL LOW (ref 36.0–46.0)
Hemoglobin: 9.4 g/dL — ABNORMAL LOW (ref 12.0–15.0)

## 2018-11-22 LAB — FERRITIN: Ferritin: 1253 ng/mL — ABNORMAL HIGH (ref 11–307)

## 2018-11-22 LAB — HEPARIN LEVEL (UNFRACTIONATED): Heparin Unfractionated: <0.1 IU/mL — ABNORMAL LOW (ref 0.30–0.70)

## 2018-11-22 LAB — PROTIME-INR
INR: 1.5
Prothrombin Time: 17.9 seconds — ABNORMAL HIGH (ref 11.4–15.2)

## 2018-11-22 LAB — MRSA PCR SCREENING: MRSA BY PCR: NEGATIVE

## 2018-11-22 LAB — INFLUENZA PANEL BY PCR (TYPE A & B)
Influenza A By PCR: NEGATIVE
Influenza B By PCR: NEGATIVE

## 2018-11-22 LAB — TROPONIN I: Troponin I: 9.3 ng/mL (ref <0.03)

## 2018-11-22 LAB — FOLATE: Folate: 29 ng/mL (ref >5.9)

## 2018-11-22 LAB — TSH: TSH: 4.684 u[IU]/mL — ABNORMAL HIGH (ref 0.350–4.500)

## 2018-11-22 MED ORDER — SODIUM CHLORIDE 0.9% IV SOLUTION
Freq: Once | INTRAVENOUS | Status: AC
  Administered 2018-11-22: 15:00:00 via INTRAVENOUS
  Filled 2018-11-22: qty 250

## 2018-11-22 MED ORDER — INSULIN GLARGINE 100 UNIT/ML ~~LOC~~ SOLN
16.0000 [IU] | Freq: Every day | SUBCUTANEOUS | Status: DC
  Administered 2018-11-22 – 2018-11-24 (×3): 16 [IU] via SUBCUTANEOUS
  Administered 2018-11-25: 8 [IU] via SUBCUTANEOUS
  Administered 2018-11-26 – 2018-11-28 (×3): 16 [IU] via SUBCUTANEOUS
  Filled 2018-11-22 (×7): qty 0.16

## 2018-11-22 MED ORDER — FERROUS SULFATE 325 (65 FE) MG PO TABS
325.0000 mg | ORAL_TABLET | Freq: Every day | ORAL | Status: DC
  Administered 2018-11-23 – 2018-11-28 (×6): 325 mg via ORAL
  Filled 2018-11-22 (×6): qty 1

## 2018-11-22 MED ORDER — MIDAZOLAM HCL 2 MG/2ML IJ SOLN
1.0000 mg | Freq: Once | INTRAMUSCULAR | Status: AC
  Administered 2018-11-22: 1 mg via INTRAVENOUS

## 2018-11-22 MED ORDER — ALBUTEROL SULFATE (2.5 MG/3ML) 0.083% IN NEBU: 2.5000 mg | INHALATION_SOLUTION | RESPIRATORY_TRACT | Status: DC | PRN

## 2018-11-22 MED ORDER — HEPARIN (PORCINE) 25000 UT/250ML-% IV SOLN
1250.0000 [IU]/h | INTRAVENOUS | Status: DC
  Administered 2018-11-22: 1250 [IU]/h via INTRAVENOUS
  Filled 2018-11-22: qty 250

## 2018-11-22 MED ORDER — SODIUM CHLORIDE 0.9 % IV BOLUS
1000.0000 mL | Freq: Once | INTRAVENOUS | Status: AC
  Administered 2018-11-22: 1000 mL via INTRAVENOUS

## 2018-11-22 MED ORDER — ROSUVASTATIN CALCIUM 10 MG PO TABS
40.0000 mg | ORAL_TABLET | Freq: Every day | ORAL | Status: DC
  Administered 2018-11-22 – 2018-11-28 (×7): 40 mg via ORAL
  Filled 2018-11-22 (×7): qty 4

## 2018-11-22 MED ORDER — OMEGA-3-ACID ETHYL ESTERS 1 G PO CAPS
1.0000 g | ORAL_CAPSULE | Freq: Two times a day (BID) | ORAL | Status: DC
  Administered 2018-11-22 – 2018-11-28 (×13): 1 g via ORAL
  Filled 2018-11-22 (×14): qty 1

## 2018-11-22 MED ORDER — CLOPIDOGREL BISULFATE 75 MG PO TABS
75.0000 mg | ORAL_TABLET | Freq: Every day | ORAL | Status: DC
  Administered 2018-11-23 – 2018-11-28 (×6): 75 mg via ORAL
  Filled 2018-11-22 (×6): qty 1

## 2018-11-22 MED ORDER — ACETAMINOPHEN 325 MG PO TABS
650.0000 mg | ORAL_TABLET | ORAL | Status: DC | PRN
  Administered 2018-11-26 – 2018-11-27 (×2): 650 mg via ORAL
  Filled 2018-11-22 (×2): qty 2

## 2018-11-22 MED ORDER — ALBUTEROL SULFATE (2.5 MG/3ML) 0.083% IN NEBU: 2.5000 mg | INHALATION_SOLUTION | RESPIRATORY_TRACT | Status: DC

## 2018-11-22 MED ORDER — VANCOMYCIN HCL IN DEXTROSE 1-5 GM/200ML-% IV SOLN
1000.0000 mg | Freq: Once | INTRAVENOUS | Status: AC
  Administered 2018-11-22: 1000 mg via INTRAVENOUS
  Filled 2018-11-22: qty 200

## 2018-11-22 MED ORDER — SODIUM CHLORIDE 0.9 % IV SOLN
10.0000 mL/h | Freq: Once | INTRAVENOUS | Status: AC
  Administered 2018-11-22: 10 mL/h via INTRAVENOUS

## 2018-11-22 MED ORDER — NITROGLYCERIN 0.4 MG SL SUBL: 0.4000 mg | SUBLINGUAL_TABLET | SUBLINGUAL | Status: DC | PRN

## 2018-11-22 MED ORDER — ACETAMINOPHEN 325 MG PO TABS: 650.0000 mg | ORAL_TABLET | Freq: Once | ORAL | Status: DC

## 2018-11-22 MED ORDER — FENTANYL CITRATE (PF) 100 MCG/2ML IJ SOLN
12.5000 ug | Freq: Once | INTRAMUSCULAR | Status: AC
  Administered 2018-11-22: 12.5 ug via INTRAVENOUS

## 2018-11-22 MED ORDER — HEPARIN BOLUS VIA INFUSION
4000.0000 [IU] | Freq: Once | INTRAVENOUS | Status: AC
  Administered 2018-11-22: 4000 [IU] via INTRAVENOUS
  Filled 2018-11-22: qty 4000

## 2018-11-22 MED ORDER — DIPHENHYDRAMINE HCL 50 MG/ML IJ SOLN: 25.0000 mg | Freq: Once | INTRAMUSCULAR | Status: DC

## 2018-11-22 MED ORDER — FAMOTIDINE IN NACL 20-0.9 MG/50ML-% IV SOLN: 20.0000 mg | Freq: Two times a day (BID) | INTRAVENOUS | Status: DC

## 2018-11-22 MED ORDER — PIPERACILLIN-TAZOBACTAM 3.375 G IVPB 30 MIN
3.3750 g | Freq: Once | INTRAVENOUS | Status: AC
  Administered 2018-11-22: 3.375 g via INTRAVENOUS
  Filled 2018-11-22: qty 50

## 2018-11-22 MED ORDER — SODIUM CHLORIDE 0.9 % IV SOLN
1.0000 g | Freq: Two times a day (BID) | INTRAVENOUS | Status: DC
  Administered 2018-11-22: 1 g via INTRAVENOUS
  Filled 2018-11-22 (×2): qty 1

## 2018-11-22 MED ORDER — SODIUM CHLORIDE 0.9 % IV SOLN
INTRAVENOUS | Status: DC
  Administered 2018-11-22: 19:00:00 via INTRAVENOUS

## 2018-11-22 MED ORDER — FENTANYL CITRATE (PF) 100 MCG/2ML IJ SOLN
INTRAMUSCULAR | Status: AC
  Administered 2018-11-22: 12.5 ug via INTRAVENOUS
  Filled 2018-11-22: qty 2

## 2018-11-22 MED ORDER — SODIUM CHLORIDE 0.9% FLUSH: 10.0000 mL | INTRAVENOUS | Status: DC | PRN

## 2018-11-22 MED ORDER — VANCOMYCIN VARIABLE DOSE PER UNSTABLE RENAL FUNCTION (PHARMACIST DOSING): Status: DC

## 2018-11-22 MED ORDER — ONDANSETRON HCL 4 MG/2ML IJ SOLN: 4.0000 mg | Freq: Four times a day (QID) | INTRAMUSCULAR | Status: DC | PRN

## 2018-11-22 MED ORDER — DOPAMINE-DEXTROSE 3.2-5 MG/ML-% IV SOLN
0.0000 ug/kg/min | INTRAVENOUS | Status: DC
  Administered 2018-11-22: 5 ug/kg/min via INTRAVENOUS
  Filled 2018-11-22: qty 250

## 2018-11-22 MED ORDER — MIDAZOLAM HCL 2 MG/2ML IJ SOLN
INTRAMUSCULAR | Status: AC
  Administered 2018-11-22: 1 mg via INTRAVENOUS
  Filled 2018-11-22: qty 2

## 2018-11-22 MED ORDER — CEFAZOLIN SODIUM-DEXTROSE 2-4 GM/100ML-% IV SOLN
2.0000 g | Freq: Two times a day (BID) | INTRAVENOUS | Status: DC
  Filled 2018-11-22: qty 100

## 2018-11-22 MED ORDER — SODIUM CHLORIDE 0.9 % IV SOLN: 1.0000 g | Freq: Three times a day (TID) | INTRAVENOUS | Status: DC

## 2018-11-22 MED ORDER — SODIUM CHLORIDE 0.9% FLUSH
10.0000 mL | Freq: Two times a day (BID) | INTRAVENOUS | Status: DC
  Administered 2018-11-22: 10 mL
  Administered 2018-11-23: 30 mL
  Administered 2018-11-24 – 2018-11-28 (×8): 10 mL

## 2018-11-22 MED ORDER — ASPIRIN EC 81 MG PO TBEC
81.0000 mg | DELAYED_RELEASE_TABLET | Freq: Every day | ORAL | Status: DC
  Administered 2018-11-23 – 2018-11-28 (×6): 81 mg via ORAL
  Filled 2018-11-22 (×6): qty 1

## 2018-11-22 MED ORDER — NOREPINEPHRINE 4 MG/250ML-% IV SOLN
0.0000 ug/min | INTRAVENOUS | Status: DC
  Administered 2018-11-22: 14 ug/min via INTRAVENOUS
  Administered 2018-11-22: 2 ug/min via INTRAVENOUS
  Administered 2018-11-23: 8 ug/min via INTRAVENOUS
  Filled 2018-11-22 (×4): qty 250

## 2018-11-22 MED ORDER — ACETAMINOPHEN 500 MG PO TABS
1000.0000 mg | ORAL_TABLET | Freq: Once | ORAL | Status: AC
  Administered 2018-11-22: 1000 mg via ORAL
  Filled 2018-11-22: qty 2

## 2018-11-22 NOTE — Progress Notes (Signed)
CODE SEPSIS - PHARMACY COMMUNICATION  **Broad Spectrum Antibiotics should be administered within 1 hour of Sepsis diagnosis**  Time Code Sepsis Called/Page Received: 2/16  Antibiotics Ordered: zosyn/vanc  Time of 1st antibiotic administration: 1158  Additional action taken by pharmacy:    If necessary, Name of Provider/Nurse Contacted:      Noralee Space ,PharmD Clinical Pharmacist  11/22/2018  12:07 PM

## 2018-11-22 NOTE — ED Notes (Signed)
PT BP remains low and pt is now tachycardic, Dr. Alfred Levins aware of same, new orders received at this time.  Awaiting inpatient bed placement at this time.  Will continue to monitor.

## 2018-11-22 NOTE — Consult Note (Signed)
Reason for Consult: Assistance with management of shock Referring Physician: Loney Hering, MD  [name] is an 63 y.o. female.  HPI:  Patient is a very complex 63 year old female, lifelong never smoker, with a history of diabetes mellitus, osteomyelitis of the left foot, severe coronary artery disease and ischemic cardiomyopathy who presented to [facility] today because of altered mental status.  The patient was noted to be hypoxemic in the emergency room and required supplemental oxygen.  On further evaluation she was noted to be hypotensive with blood pressure systolic in the 55H.  Patient states that she has been having some nausea and vomiting at home and poor appetite.  This started approximately 3 days ago.  Of note 3 days ago she also completed infusions at home of cefazolin 2 g every 8 hours for management of MSSA bacteremia recently noted.  Patient also had issues with osteomyelitis and required amputation of toe on her left foot.  She has noted worsening malaise and anorexia over the last several days.  She is encephalopathic and her responses are somewhat slowed at times.  In the emergency room she was noted to have significant pancytopenia for hemoglobin of 7 and an elevated creatinine at 3.73 with a BUN of 58 which is well above her baseline.  Patient also was noted to have an elevated troponin at 9.3.  BNP was noted to be over 2000.  Chest x-ray did not indicate evidence of heart failure and showed cardiomegaly, AICD in place and infiltrate previously noted.  Patient was discharged from [facility] 2 weeks ago after she was noted to have MSSA bacteremia and infection of the left toe with associated osteomyelitis.  She was discharged on the aforementioned cefazolin which was being administered by her husband.  As noted she been infiltrated.  Emergency room because of concern for PICC line infection, the PICC line was discontinued.  She was started on heparin after being evaluated by cardiology (Dr.  Rockey Situ) however heparin is now being held because of her thrombocytopenia.  She is now on pressors for hypotension and supplemental oxygen for hypoxemia.  Patient was admitted and brought to the intensive care unit she requires central access due to the need for pressors she has been improving somewhat with regards to her mental status.  Of note during insertion of the line it was noted that her right IJ was quite collapsible consistent with possible volume depletion.  Of note also the patient was noted to be febrile to 102 on evaluation at the emergency room.  Blood cultures drawn in the emergency room are consistent with gram-positive cocci.     Past Medical History:  Diagnosis Date  . Acute myocardial infarction, subendocardial infarction, initial episode of care ([place]) 07/21/2012  . Acute osteomyelitis involving ankle and foot ([place]) 02/06/2015  . Acute systolic heart failure ([place]) 07/21/2012   New onset 07/19/12; admission to [facility] ED. Elevated Troponins.  S/p 2D-echo with EF 20-25%.  S/p cardiac catheterization with stenting LAD.  Repeat 2D-echo 10/2011 with improved EF of 35%.   . Anemia   . Automatic implantable cardioverter-defibrillator in situ    a. MDT CRT-D 06/2014, SN: [id number]    . CAD (coronary artery disease)    a. cardiac cath 101/04/2012: PCI/DES to chronically occluded mLAD, consideration PCI to diag branch in 4 weeks.   . Cataract   . Chicken pox   . Chronic kidney disease   . Chronic systolic CHF (congestive heart failure) (HCC)    a. mixed  ICM & NICM; b. EF 20-25% by echo 07/2012, mid-dist 2/3 of LV sev HK/AK, mild MR. echo 10/2012: EF 30-35%, sev HK ant-septal & inf walls, GR1DD, mild MR, PASP 33. c. echo 02/2013: EF 30%, GR1DD, mild MR. echo 04/2014: EF 30%, Septal-lat dyssynchrony, global HK, inf AK, GR1DD, mild MR. d. echo 10/2014: EF50-55%, WM nl, GR1DD, septal mild paradox. e. echo 02/2015: EF 50-55%, wm   . Depression   . Heart attack ([place])   . Heel ulcer ([place])  04/27/2015  . History of blood transfusion ~ 2011   "plasma; had neuropathy; couldn't walk"  . Hypertension   . LBBB (left bundle branch block)   . Neuromuscular disorder ([place])   . Neuropathy 2011  . Obesity, unspecified   . OSA on CPAP    Moderate with AHI 23/hr and now on CPAP at 16cm H2O.  Her DME is [facility]  . Pure hypercholesterolemia   . Type II diabetes mellitus ([place])   . Unspecified vitamin D deficiency     Past Surgical History:  Procedure Laterality Date  . AMPUTATION TOE Right 06/18/2015   Procedure: AMPUTATION TOE;  Surgeon: Samara Deist, DPM;  Location: [facility] ORS;  Service: Podiatry;  Laterality: Right;  . AMPUTATION TOE Left 10/08/2018   Procedure: AMPUTATION TOE LEFT 2ND;  Surgeon: Albertine Patricia, DPM;  Location: [facility] ORS;  Service: Podiatry;  Laterality: Left;  . BACK SURGERY    . BI-VENTRICULAR IMPLANTABLE CARDIOVERTER DEFIBRILLATOR N/A 07/06/2014   Procedure: BI-VENTRICULAR IMPLANTABLE CARDIOVERTER DEFIBRILLATOR  (CRT-D);  Surgeon: Deboraha Sprang, MD;  Location: [facility] CATH LAB;  Service: Cardiovascular;  Laterality: N/A;  . BI-VENTRICULAR IMPLANTABLE CARDIOVERTER DEFIBRILLATOR  (CRT-D)  07/06/2014  . BILATERAL OOPHORECTOMY  01/2011   ovarian cyst benign  . COLONOSCOPY WITH PROPOFOL Left 02/22/2015   Procedure: COLONOSCOPY WITH PROPOFOL;  Surgeon: Hulen Luster, MD;  Location: [facility] ENDOSCOPY;  Service: Endoscopy;  Laterality: Left;  . CORONARY ANGIOPLASTY WITH STENT PLACEMENT Left 07/2012   new onset systolic CHF; elevated troponins.  Cardiac catheterization with stenting to LAD; EF 15%.  2D-echo: EF 20-25%.  . ESOPHAGOGASTRODUODENOSCOPY N/A 02/22/2015   Procedure: ESOPHAGOGASTRODUODENOSCOPY (EGD);  Surgeon: Hulen Luster, MD;  Location: [facility] ENDOSCOPY;  Service: Endoscopy;  Laterality: N/A;  . INCISION AND DRAINAGE ABSCESS Right 2007   groin; with ICU stay due to sepsis.  Marland Kitchen LAPAROSCOPIC CHOLECYSTECTOMY  2011  . LEFT HEART CATH AND CORONARY ANGIOGRAPHY N/A 10/05/2018   Procedure:  LEFT HEART CATH AND CORONARY ANGIOGRAPHY;  Surgeon: Minna Merritts, MD;  Location: [facility] CV LAB;  Service: Cardiovascular;  Laterality: N/A;  . LEFT HEART CATHETERIZATION WITH CORONARY ANGIOGRAM N/A 07/21/2012   Procedure: LEFT HEART CATHETERIZATION WITH CORONARY ANGIOGRAM;  Surgeon: Jolaine Artist, MD;  Location: [facility] CATH LAB;  Service: Cardiovascular;  Laterality: N/A;  . [place]   L4-5  . PERCUTANEOUS CORONARY STENT INTERVENTION (PCI-S) N/A 07/23/2012   Procedure: PERCUTANEOUS CORONARY STENT INTERVENTION (PCI-S);  Surgeon: Sherren Mocha, MD;  Location: [facility] CATH LAB;  Service: Cardiovascular;  Laterality: N/A;  . PERIPHERAL VASCULAR BALLOON ANGIOPLASTY Left 10/06/2018   Procedure: PERIPHERAL VASCULAR BALLOON ANGIOPLASTY;  Surgeon: Katha Cabal, MD;  Location: [place] CV LAB;  Service: Cardiovascular;  Laterality: Left;  . PERIPHERAL VASCULAR CATHETERIZATION N/A 02/10/2015   Procedure: Picc Line Insertion;  Surgeon: Katha Cabal, MD;  Location: [place] CV LAB;  Service: Cardiovascular;  Laterality: N/A;  . TEE WITHOUT CARDIOVERSION N/A 10/02/2018   Procedure: TRANSESOPHAGEAL ECHOCARDIOGRAM (TEE);  Surgeon: Wellington Hampshire, MD;  Location: [facility] ORS;  Service: Cardiovascular;  Laterality: N/A;  . [place]  . VAGINAL HYSTERECTOMY  01/2011   Fibroids/DUB.  Ovaries removed. [place].    Family History  Problem Relation Age of Onset  . Diabetes Mother   . Hypertension Mother   . Arthritis Mother        knees, lumbar DDD, cervical DDD  . Cancer Father        prostate,skin,lymphoma.  . Cancer Brother 79       bladder cancer; non-smoker  . Diabetes Maternal Grandmother   . Heart disease Maternal Grandmother   . COPD Maternal Grandmother   . Diabetes Paternal Grandmother   . Obesity Brother   . Diabetes Son   . Hypertension Son   . Cancer Maternal Grandfather   . Diabetes Paternal Grandfather     Social History:  reports  that she has never smoked. She has never used smokeless tobacco. She reports that she does not drink alcohol or use drugs.  Allergies: No Known Allergies  Medications: I have reviewed the patient's current medications.  Results for orders placed or performed during the hospital encounter of 11/22/18 (from the past 48 hour(s))  CBC with Differential/Platelet     Status: Abnormal   Collection Time: 11/22/18 11:16 AM  Result Value Ref Range   WBC 4.8 4.0 - 10.5 K/uL   RBC 2.44 (L) 3.87 - 5.11 MIL/uL   Hemoglobin 7.0 (L) 12.0 - 15.0 g/dL   HCT 22.4 (L) 36.0 - 46.0 %   MCV 91.8 80.0 - 100.0 fL   MCH 28.7 26.0 - 34.0 pg   MCHC 31.3 30.0 - 36.0 g/dL   RDW 13.9 11.5 - 15.5 %   Platelets 84 (L) 150 - 400 K/uL    Comment: Immature Platelet Fraction may be clinically indicated, consider ordering this additional test [id number]    nRBC 0.0 0.0 - 0.2 %   Neutrophils Relative % 84 %   Neutro Abs 4.1 1.7 - 7.7 K/uL   Lymphocytes Relative 6 %   Lymphs Abs 0.3 (L) 0.7 - 4.0 K/uL   Monocytes Relative 6 %   Monocytes Absolute 0.3 0.1 - 1.0 K/uL   Eosinophils Relative 3 %   Eosinophils Absolute 0.1 0.0 - 0.5 K/uL   Basophils Relative 0 %   Basophils Absolute 0.0 0.0 - 0.1 K/uL   WBC Morphology MORPHOLOGY UNREMARKABLE    RBC Morphology MORPHOLOGY UNREMARKABLE    Smear Review MORPHOLOGY UNREMARKABLE    Immature Granulocytes 1 %   Abs Immature Granulocytes 0.03 0.00 - 0.07 K/uL    Comment: Performed at [facility], [address] [place], [place] [address]  Comprehensive metabolic panel     Status: Abnormal   Collection Time: 11/22/18 11:16 AM  Result Value Ref Range   Sodium 132 (L) 135 - 145 mmol/L   Potassium 4.1 3.5 - 5.1 mmol/L   Chloride 93 (L) 98 - 111 mmol/L   CO2 27 22 - 32 mmol/L   Glucose, Bld 251 (H) 70 - 99 mg/dL   BUN 58 (H) 8 - 23 mg/dL   Creatinine, Ser 3.73 (H) 0.44 - 1.00 mg/dL   Calcium 7.5 (L) 8.9 - 10.3 mg/dL   Total Protein 6.6 6.5 - 8.1 g/dL   Albumin  2.9 (L) 3.5 - 5.0 g/dL   AST 197 (H) 15 - 41 U/L   ALT 33 0 - 44 U/L   Alkaline Phosphatase 43  38 - 126 U/L   Total Bilirubin 0.8 0.3 - 1.2 mg/dL   GFR calc non Af Amer 12 (L) >60 mL/min   GFR calc Af Amer 14 (L) >60 mL/min   Anion gap 12 5 - 15    Comment: Performed at [facility], [address] [place], [place] [address]  Brain natriuretic peptide     Status: Abnormal   Collection Time: 11/22/18 11:16 AM  Result Value Ref Range   B Natriuretic Peptide 2,246.0 (H) 0.0 - 100.0 pg/mL    Comment: Performed at [facility], [address] [place], [place] [address]  Troponin I - ONCE - STAT     Status: Abnormal   Collection Time: 11/22/18 11:16 AM  Result Value Ref Range   Troponin I 9.30 (HH) <0.03 ng/mL    Comment: CRITICAL RESULT CALLED TO, READ BACK BY AND VERIFIED WITH JENNIFER WHITLEY AT 1148 11/22/2018 DAS Performed at [facility], [address] [place], [place] [address]   Lactic acid, plasma     Status: Abnormal   Collection Time: 11/22/18 11:16 AM  Result Value Ref Range   Lactic Acid, Venous 2.0 (HH) 0.5 - 1.9 mmol/L    Comment: CRITICAL RESULT CALLED TO, READ BACK BY AND VERIFIED WITH JENNIFER [facility] AT 1147 11/22/2018 DAS Performed at [facility], [address] [place], [place] [address]   Blood culture (routine x 2)     Status: None (Preliminary result)   Collection Time: 11/22/18 11:16 AM  Result Value Ref Range   Specimen Description BLOOD BLOOD LEFT HAND    Special Requests      BOTTLES DRAWN AEROBIC AND ANAEROBIC Blood Culture adequate volume   Culture  Setup Time      Organism ID to follow GRAM POSITIVE COCCI IN BOTH AEROBIC AND ANAEROBIC BOTTLES CRITICAL RESULT CALLED TO, READ BACK BY AND VERIFIED WITH: LISA KLUTTZ 11/22/18 @ 1837  [place] Performed at [facility] [address] [place], [place] [address]    Culture GRAM POSITIVE COCCI    Report Status PENDING   APTT     Status: Abnormal    Collection Time: 11/22/18 11:16 AM  Result Value Ref Range   aPTT 38 (H) 24 - 36 seconds    Comment:        IF BASELINE aPTT IS ELEVATED, SUGGEST PATIENT RISK ASSESSMENT BE USED TO DETERMINE APPROPRIATE ANTICOAGULANT THERAPY. Performed at [facility], [address] [place], [place] [address]   Protime-INR     Status: Abnormal   Collection Time: 11/22/18 11:16 AM  Result Value Ref Range   Prothrombin Time 17.9 (H) 11.4 - 15.2 seconds   INR 1.50     Comment: Performed at [facility], [address] [place], [place] [address]  Blood Culture ID Panel (Reflexed)     Status: Abnormal   Collection Time: 11/22/18 11:16 AM  Result Value Ref Range   Enterococcus species NOT DETECTED NOT DETECTED   Listeria monocytogenes NOT DETECTED NOT DETECTED   Staphylococcus species DETECTED (A) NOT DETECTED    Comment: CRITICAL RESULT CALLED TO, READ BACK BY AND VERIFIED WITH: LISA KLUTTZ 11/22/18 @ 1837  MLK    Staphylococcus aureus (BCID) DETECTED (A) NOT DETECTED    Comment: Methicillin (oxacillin) susceptible Staphylococcus aureus (MSSA). Preferred therapy is anti staphylococcal beta lactam antibiotic (Cefazolin or Nafcillin), unless clinically contraindicated. CRITICAL RESULT CALLED TO, READ BACK BY AND VERIFIED WITH: LISA KLUTTZ 11/22/18 @ [place]  Methicillin resistance NOT DETECTED NOT DETECTED   Streptococcus species NOT DETECTED NOT DETECTED   Streptococcus agalactiae NOT DETECTED NOT DETECTED   Streptococcus pneumoniae NOT DETECTED NOT DETECTED   Streptococcus pyogenes NOT DETECTED NOT DETECTED   Acinetobacter baumannii NOT DETECTED NOT DETECTED   Enterobacteriaceae species NOT DETECTED NOT DETECTED   Enterobacter cloacae complex NOT DETECTED NOT DETECTED   Escherichia coli NOT DETECTED NOT DETECTED   Klebsiella oxytoca NOT DETECTED NOT DETECTED   Klebsiella pneumoniae NOT DETECTED NOT DETECTED   Proteus species NOT DETECTED NOT DETECTED   Serratia marcescens  NOT DETECTED NOT DETECTED   Haemophilus influenzae NOT DETECTED NOT DETECTED   Neisseria meningitidis NOT DETECTED NOT DETECTED   Pseudomonas aeruginosa NOT DETECTED NOT DETECTED   Candida albicans NOT DETECTED NOT DETECTED   Candida glabrata NOT DETECTED NOT DETECTED   Candida krusei NOT DETECTED NOT DETECTED   Candida parapsilosis NOT DETECTED NOT DETECTED   Candida tropicalis NOT DETECTED NOT DETECTED    Comment: Performed at [facility], [address] [place], [place] [address]  Influenza panel by PCR (type A & B)     Status: None   Collection Time: 11/22/18 11:46 AM  Result Value Ref Range   Influenza A By PCR NEGATIVE NEGATIVE   Influenza B By PCR NEGATIVE NEGATIVE    Comment: (NOTE) The Xpert Xpress Flu assay is intended as an aid in the diagnosis of  influenza and should not be used as a sole basis for treatment.  This  assay is FDA approved for nasopharyngeal swab specimens only. Nasal  washings and aspirates are unacceptable for Xpert Xpress Flu testing. Performed at [facility], [address] [place], [place] [address]   Prepare RBC     Status: None   Collection Time: 11/22/18 12:19 PM  Result Value Ref Range   Order Confirmation      ORDER PROCESSED BY BLOOD BANK Performed at [facility], [address] [place], [place] [address]   Type and screen Ordered by PROVIDER DEFAULT     Status: None (Preliminary result)   Collection Time: 11/22/18 12:59 PM  Result Value Ref Range   ABO/RH(D) O POS    Antibody Screen NEG    Sample Expiration 11/25/2018    Unit Number [id number]    Blood Component Type RED CELLS,LR    Unit division 00    Status of Unit ALLOCATED    Transfusion Status OK TO TRANSFUSE    Crossmatch Result Compatible    Unit Number [id number]    Blood Component Type RED CELLS,LR    Unit division 00    Status of Unit ISSUED    Transfusion Status OK TO TRANSFUSE    Crossmatch Result      Compatible Performed at  [facility], [address] [place], [place] [address]   Lactic acid, plasma     Status: None   Collection Time: 11/22/18  1:06 PM  Result Value Ref Range   Lactic Acid, Venous 1.7 0.5 - 1.9 mmol/L    Comment: Performed at [facility], [address] [place], [place] [address]  Prepare RBC     Status: None   Collection Time: 11/22/18  2:30 PM  Result Value Ref Range   Order Confirmation      DUPLICATE REQUEST Performed at [facility], [address] [place], [place] [address]   Glucose, capillary     Status: Abnormal   Collection Time: 11/22/18  3:36 PM  Result Value Ref Range   Glucose-Capillary 279 (H) 70 - 99 mg/dL  Blood gas, arterial     Status: Abnormal (Preliminary result)   Collection Time: 11/22/18  3:41 PM  Result Value Ref Range   FIO2 0.36    Delivery systems NASAL CANNULA    pH, Arterial 7.32 (L) 7.350 - 7.450   pCO2 arterial 46 32.0 - 48.0 mmHg   pO2, Arterial PENDING 83.0 - 108.0 mmHg   Bicarbonate 23.7 20.0 - 28.0 mmol/L   Acid-base deficit 2.6 (H) 0.0 - 2.0 mmol/L   O2 Saturation 44.0 %   Patient temperature 37.0    Collection site RIGHT RADIAL    Sample type VENOUS    Allens test (pass/fail) PASS PASS    Comment: Performed at [facility], [address] [place], [place] [address]  MRSA PCR Screening     Status: None   Collection Time: 11/22/18  3:44 PM  Result Value Ref Range   MRSA by PCR NEGATIVE NEGATIVE    Comment:        The GeneXpert MRSA Assay (FDA approved for NASAL specimens only), is one component of a comprehensive MRSA colonization surveillance program. It is not intended to diagnose MRSA infection nor to guide or monitor treatment for MRSA infections. Performed at [facility], [address] [place], [place] [address]   Procalcitonin - Baseline     Status: None   Collection Time: 11/22/18  6:50 PM  Result Value Ref Range   Procalcitonin 55.64 ng/mL    Comment:         Interpretation: PCT >= 10 ng/mL: Important systemic inflammatory response, almost exclusively due to severe bacterial sepsis or septic shock. (NOTE)       Sepsis PCT Algorithm           Lower Respiratory Tract                                      Infection PCT Algorithm    ----------------------------     ----------------------------         PCT < 0.25 ng/mL                PCT < 0.10 ng/mL         Strongly encourage             Strongly discourage   discontinuation of antibiotics    initiation of antibiotics    ----------------------------     -----------------------------       PCT 0.25 - 0.50 ng/mL            PCT 0.10 - 0.25 ng/mL               OR       >80% decrease in PCT            Discourage initiation of                                            antibiotics      Encourage discontinuation           of antibiotics    ----------------------------     -----------------------------         PCT >= 0.50 ng/mL  PCT 0.26 - 0.50 ng/mL                AND       <80% decrease in PCT             Encourage initiation of                                             antibiotics       Encourage continuation           of antibiotics    ----------------------------     -----------------------------        PCT >= 0.50 ng/mL                  PCT > 0.50 ng/mL               AND         increase in PCT                  Strongly encourage                                      initiation of antibiotics    Strongly encourage escalation           of antibiotics                                     -----------------------------                                           PCT <= 0.25 ng/mL                                                 OR                                        > 80% decrease in PCT                                     Discontinue / Do not initiate                                             antibiotics Performed at [facility], [address]  [place], [place] [address]   Reticulocytes     Status: Abnormal   Collection Time: 11/22/18  6:50 PM  Result Value Ref Range   Retic Ct Pct 3.0 0.4 - 3.1 %   RBC. 3.36 (L) 3.87 - 5.11 MIL/uL   Retic Count, Absolute 100.5 19.0 - 186.0 K/uL   Immature Retic Fract 18.0 (H) 2.3 - 15.9 %    Comment: Performed at [facility], [place], [place] [address]  Heparin level (unfractionated)     Status: Abnormal   Collection Time: 11/22/18  6:50 PM  Result Value Ref Range   Heparin Unfractionated <0.10 (L) 0.30 - 0.70 IU/mL    Comment: (NOTE) If heparin results are below expected values, and patient dosage has  been confirmed, suggest follow up testing of antithrombin III levels. Performed at [facility], [address] [place], [place] [address]   Ammonia     Status: None   Collection Time: 11/22/18  6:50 PM  Result Value Ref Range   Ammonia 26 9 - 35 umol/L    Comment: Performed at [facility], [address] [place], [place] [address]  Hemoglobin and hematocrit, blood     Status: Abnormal   Collection Time: 11/22/18  6:50 PM  Result Value Ref Range   Hemoglobin 9.4 (L) 12.0 - 15.0 g/dL   HCT 30.0 (L) 36.0 - 46.0 %    Comment: Performed at [facility], [address], [place], [place] [address]    Dg Chest Port 1 View  Result Date: 11/22/2018 CLINICAL DATA:  Central line placement EXAM: PORTABLE CHEST 1 VIEW COMPARISON:  November 22, 2018 FINDINGS: The new right central line terminates near the caval atrial junction. No pneumothorax. Right perihilar opacity is a little less conspicuous but remains. Mild increased interstitial opacities suggest pulmonary venous congestion. The cardiomediastinal silhouette is stable. No other change. IMPRESSION: 1. The new right central line terminates near the caval atrial junction in good position with no pneumothorax. 2. Probable pulmonary venous congestion/mild edema. 3. Persistent but slightly less  prominent right perihilar opacity. Recommend follow-up to resolution. Electronically Signed   By: Dorise Bullion III M.D   On: 11/22/2018 18:38   Dg Chest Portable 1 View  Result Date: 11/22/2018 CLINICAL DATA:  Altered mental status. Treated for pneumonia 2 weeks ago. EXAM: PORTABLE CHEST 1 VIEW COMPARISON:  Radiographs 10/30/2018 and 10/27/2018. FINDINGS: 1121 hours. Two views submitted. Right arm PICC extends to the superior cavoatrial junction. Left subclavian defibrillator leads appear grossly unchanged within the right atrium, right ventricle and coronary sinus. There is stable mild cardiomegaly and aortic atherosclerosis. Pulmonary edema has improved. There is stable patchy opacity at the right lung base. Veiling opacity in the right perihilar region has increased, suspicious for fluid in the fissure, less likely consolidation in the superior segment of the lower lobe. IMPRESSION: 1. Interval increased right perihilar opacity, probably fluid in the fissure versus consolidation in the superior segment of the lower lobe. 2. Patchy right basilar airspace disease is unchanged. No definite residual edema. Electronically Signed   By: Richardean Sale M.D.   On: 11/22/2018 12:22    Review of Systems  Constitutional: Positive for fever and malaise/fatigue.  HENT: Negative.   Eyes: Negative.   Respiratory: Negative.   Gastrointestinal: Positive for nausea and vomiting.  Genitourinary: Negative.   Musculoskeletal: Negative.   Skin: Negative.   Neurological: Positive for weakness.  Endo/Heme/Allergies: Bruises/bleeds easily.  Psychiatric/Behavioral: Negative.    Blood pressure (!) 125/52, pulse 82, temperature 98.4 F (36.9 C), temperature source Oral, resp. rate 18, height 5\' 8"  (1.727 m), weight 128.4 kg, SpO2 98 %. Physical Exam  Constitutional: She is oriented to person, place, and time. She appears well-developed. She appears lethargic.  Non-toxic appearance. She appears ill. No distress.   Obese  HENT:  Head: Normocephalic.  Right Ear: External ear normal.  Left Ear: External ear normal.  Nose: Nose normal.  Mouth/Throat: Mucous membranes are pale and dry.  Neck: Neck supple. No tracheal deviation present. No thyromegaly present.  Cardiovascular: Normal rate, regular rhythm, S1 normal, S2 normal and intact distal pulses.  Occasional extrasystoles are present. PMI is not displaced.  Murmur heard.  Systolic murmur is present with a grade of 2/6.  Diastolic murmur is present with a grade of 2/6. Respiratory: No accessory muscle usage or stridor. No tachypnea. No respiratory distress. She has no decreased breath sounds. She has no wheezes. She has no rhonchi. She has no rales.  GI: Normal appearance. She exhibits distension. Bowel sounds are decreased. No hernia.  Genitourinary:    Genitourinary Comments: Foley in place   Musculoskeletal:        General: No edema.     Comments: Amputations of toes on both feet.  Most recent on left foot with good healing.  Neurological: She is oriented to person, place, and time. She has normal strength. She appears lethargic. She displays no tremor. No cranial nerve deficit.  Skin: Skin is warm and dry. Ecchymosis noted. No cyanosis. There is pallor. Nails show no clubbing.  Psychiatric: Her speech is normal. Her affect is blunt. She is slowed.    Assessment/Plan: 1.  Shock with multiorgan dysfunction: This appears to be related to septic shock versus hypovolemia due to significant anemia.  Will continue pressors.  She has been pancultured and will continue antibiotics.  In addition, she may be actually volume depleted rather than volume overloaded and will monitor central venous pressure and volume resuscitate as needed.  2.  Gram-positive bacteremia, I have discussed with pharmacy will continue vancomycin for now as she just completed cefazolin 3 days ago and the organism may be resistant.  In addition, the patient may be having issues with  cefazolin as described below.  3.  Pancytopenia, query adverse effects from cefazolin.  Continue to monitor.  4.  Anemia: No overt evidence of bleeding.  Being transfused.  5.  Acute on chronic kidney failure her creatinine has nearly doubled.  This may be related to volume depletion versus adverse effect of cefazolin.  Avoid nephrotoxins.  Gentle volume resuscitation.  Consider nephrology consult if not significantly improved by the morning.  Continue pressor support to maintain M AP above 65 to ensure good renal perfusion.  6.  Elevated troponin: This appears to be due to demand ischemia in a patient with known severe coronary artery disease.  Supplemental oxygen and supportive care.  Discussed with Dr. Rockey Situ and no plan to have cardiac intervention at present.  She is not endorsing chest pain at present.  7.  Hypoxemia: Supplemental O2 to keep saturations at 92% or better.   8.  Encephalopathy: She is improving with improvement of her hemodynamics.  Suspect that this has to do with her shock state aggravated by gram-positive bacteremia.  9.  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  She recently completed cefazolin for MSSA bacteremia.  Her wound appears to be healing well.  Persistence of bacteremia may indicate device infection or persistent osteomyelitis.  At present it is uncertain whether cefazolin may be responsible for some of her issues and therefore will continue vancomycin IV until organism known.  This was discussed with pharmacy.    Total critical care time 120 minutes excluding procedure time.  Discussed in detail with Dr.Gollan.   Sullivan Lone, MD [facility]  11/22/2018, 9:01 PM

## 2018-11-22 NOTE — ED Notes (Signed)
Pt BP dropped, to room to check on pt, pt noted to have accidentally removed Iv from right hand where dopamine was infusing.  Dopamine drip changed to Right AC twin cath, will monitor pt for improvement in BP.

## 2018-11-22 NOTE — Procedures (Signed)
Central Venous Catheter Insertion Procedure Note [name] [id number] [birth year]  Procedure: Insertion of Central Venous Catheter Indications: Assessment of intravascular volume, Drug and/or fluid administration and Frequent blood sampling  Procedure Details Consent: Unable to obtain consent because of emergent medical necessity. Time Out: Verified patient identification, verified procedure, site/side was marked, verified correct patient position, special equipment/implants available, medications/allergies/relevent history reviewed, required imaging and test results available.  Performed  Maximum sterile technique was used including antiseptics, cap, gloves, gown, hand hygiene, mask and sheet. Skin prep: Chlorhexidine; local anesthetic administered A antimicrobial bonded/coated triple lumen catheter was placed in the right internal jugular vein using the Seldinger technique. Ultrasound utilized for insertion.  Evaluation Blood flow fair Complications: No apparent complications Patient did tolerate procedure well. Chest X-ray ordered to verify placement.  CXR: line in good position, no PTX.  Renold Don, MD [facility] 11/22/2018, 8:51 PM

## 2018-11-22 NOTE — Consult Note (Signed)
Cardiology Consultation:   Patient ID: [name] MRN: [id number]; DOB: March 31, 1956  Admit date: 11/22/2018 Date of Consult: 11/22/2018  Primary Care Provider: Rutherford Guys, MD Primary Cardiologist: [facility] Physician requesting consult: Dr. Jerelyn Charles Reason for consult: Hypotension, elevated troponin    Patient Profile:   [name] is a 63 y.o. 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 s/p CRT-D 2015 with improvement in EF, type II diabetes, hypertension, obesity, OSA prescribed CPAP, recent NSTEMI and MSSA bacteremia who is being seen today for the evaluation of elevated troponin, hypertension  History of Present Illness:   [name] was recently discharged November 04, 2018 after hospitalization for osteomyelitis of the foot, toe amputation, acute respiratory distress felt secondary to anemia, pneumonia, systolic and diastolic CHF improved with diuretics,  Recent non-STEMI December 2019 secondary to demand from MSSA bacteremia and osteo-, no stent placed at that time given pending foot surgery,  --- Who presents with worsening malaise, anorexia over the past several days  She was recently discharged on cefazolin 2 g every 8 hours from the time of her discharge until 3 days ago.  Was transported by EMS to the hospital.  nursing notes indicating altered mental status at home, giving one-word answers in the emergency room  She still has a PICC line in place.  Hypoxic on arrival, hypotensive, appeared dehydrated with dry mouth dry lips, confused Systolic pressure 82, 35% oxygenation on room air Lab work showing pancytopenia hemoglobin of 7, glucose 250 creatinine 3.73 with BUN 58 well above her baseline sodium down to 132 platelets 84 Troponin up to 9.3  Case discussed with emergency room physician in detail Plan made for her hypotension, started on nor epi with improvement in her blood pressure In the ICU systolic  pressures in the 120s, mentating better Central line placed, vein noted to be collapsing consistent with dehydration    Past Medical History:  Diagnosis Date  . Acute myocardial infarction, subendocardial infarction, initial episode of care ([place]) 07/21/2012  . Acute osteomyelitis involving ankle and foot ([place]) 02/06/2015  . Acute systolic heart failure ([place]) 07/21/2012   New onset 07/19/12; admission to [facility] ED. Elevated Troponins.  S/p 2D-echo with EF 20-25%.  S/p cardiac catheterization with stenting LAD.  Repeat 2D-echo 10/2011 with improved EF of 35%.   . Anemia   . Automatic implantable cardioverter-defibrillator in situ    a. MDT CRT-D 06/2014, SN: [id number]    . CAD (coronary artery disease)    a. cardiac cath 101/04/2012: PCI/DES to chronically occluded mLAD, consideration PCI to diag branch in 4 weeks.   . Cataract   . Chicken pox   . Chronic kidney disease   . Chronic systolic CHF (congestive heart failure) (HCC)    a. mixed ICM & NICM; b. EF 20-25% by echo 07/2012, mid-dist 2/3 of LV sev HK/AK, mild MR. echo 10/2012: EF 30-35%, sev HK ant-septal & inf walls, GR1DD, mild MR, PASP 33. c. echo 02/2013: EF 30%, GR1DD, mild MR. echo 04/2014: EF 30%, Septal-lat dyssynchrony, global HK, inf AK, GR1DD, mild MR. d. echo 10/2014: EF50-55%, WM nl, GR1DD, septal mild paradox. e. echo 02/2015: EF 50-55%, wm   . Depression   . Heart attack ([place])   . Heel ulcer ([place]) 04/27/2015  . History of blood transfusion ~ 2011   "plasma; had neuropathy; couldn't walk"  . Hypertension   . LBBB (left bundle branch block)   . Neuromuscular disorder ([place])   .  Neuropathy 2011  . Obesity, unspecified   . OSA on CPAP    Moderate with AHI 23/hr and now on CPAP at 16cm H2O.  Her DME is [facility]  . Pure hypercholesterolemia   . Type II diabetes mellitus ([place])   . Unspecified vitamin D deficiency     Past Surgical History:  Procedure Laterality Date  . AMPUTATION TOE Right 06/18/2015   Procedure: AMPUTATION  TOE;  Surgeon: Samara Deist, DPM;  Location: [facility] ORS;  Service: Podiatry;  Laterality: Right;  . AMPUTATION TOE Left 10/08/2018   Procedure: AMPUTATION TOE LEFT 2ND;  Surgeon: Albertine Patricia, DPM;  Location: [facility] ORS;  Service: Podiatry;  Laterality: Left;  . BACK SURGERY    . BI-VENTRICULAR IMPLANTABLE CARDIOVERTER DEFIBRILLATOR N/A 07/06/2014   Procedure: BI-VENTRICULAR IMPLANTABLE CARDIOVERTER DEFIBRILLATOR  (CRT-D);  Surgeon: Deboraha Sprang, MD;  Location: [facility] CATH LAB;  Service: Cardiovascular;  Laterality: N/A;  . BI-VENTRICULAR IMPLANTABLE CARDIOVERTER DEFIBRILLATOR  (CRT-D)  07/06/2014  . BILATERAL OOPHORECTOMY  01/2011   ovarian cyst benign  . COLONOSCOPY WITH PROPOFOL Left 02/22/2015   Procedure: COLONOSCOPY WITH PROPOFOL;  Surgeon: Hulen Luster, MD;  Location: [facility] ENDOSCOPY;  Service: Endoscopy;  Laterality: Left;  . CORONARY ANGIOPLASTY WITH STENT PLACEMENT Left 07/2012   new onset systolic CHF; elevated troponins.  Cardiac catheterization with stenting to LAD; EF 15%.  2D-echo: EF 20-25%.  . ESOPHAGOGASTRODUODENOSCOPY N/A 02/22/2015   Procedure: ESOPHAGOGASTRODUODENOSCOPY (EGD);  Surgeon: Hulen Luster, MD;  Location: [facility] ENDOSCOPY;  Service: Endoscopy;  Laterality: N/A;  . INCISION AND DRAINAGE ABSCESS Right 2007   groin; with ICU stay due to sepsis.  Marland Kitchen LAPAROSCOPIC CHOLECYSTECTOMY  2011  . LEFT HEART CATH AND CORONARY ANGIOGRAPHY N/A 10/05/2018   Procedure: LEFT HEART CATH AND CORONARY ANGIOGRAPHY;  Surgeon: Minna Merritts, MD;  Location: [place] CV LAB;  Service: Cardiovascular;  Laterality: N/A;  . LEFT HEART CATHETERIZATION WITH CORONARY ANGIOGRAM N/A 07/21/2012   Procedure: LEFT HEART CATHETERIZATION WITH CORONARY ANGIOGRAM;  Surgeon: Jolaine Artist, MD;  Location: [facility] CATH LAB;  Service: Cardiovascular;  Laterality: N/A;  . [place]   L4-5  . PERCUTANEOUS CORONARY STENT INTERVENTION (PCI-S) N/A 07/23/2012   Procedure: PERCUTANEOUS CORONARY STENT  INTERVENTION (PCI-S);  Surgeon: Sherren Mocha, MD;  Location: [facility] CATH LAB;  Service: Cardiovascular;  Laterality: N/A;  . PERIPHERAL VASCULAR BALLOON ANGIOPLASTY Left 10/06/2018   Procedure: PERIPHERAL VASCULAR BALLOON ANGIOPLASTY;  Surgeon: Katha Cabal, MD;  Location: [place] CV LAB;  Service: Cardiovascular;  Laterality: Left;  . PERIPHERAL VASCULAR CATHETERIZATION N/A 02/10/2015   Procedure: Picc Line Insertion;  Surgeon: Katha Cabal, MD;  Location: [place] CV LAB;  Service: Cardiovascular;  Laterality: N/A;  . TEE WITHOUT CARDIOVERSION N/A 10/02/2018   Procedure: TRANSESOPHAGEAL ECHOCARDIOGRAM (TEE);  Surgeon: Wellington Hampshire, MD;  Location: [facility] ORS;  Service: Cardiovascular;  Laterality: N/A;  . [place]  . VAGINAL HYSTERECTOMY  01/2011   Fibroids/DUB.  Ovaries removed. [place].     Home Medications:  Prior to Admission medications   Medication Sig Start Date End Date Taking? Authorizing Provider  aspirin EC 81 MG EC tablet Take 1 tablet (81 mg total) by mouth daily. 10/10/18  Yes Salary, Avel Peace, MD  Calcium Carb-Cholecalciferol (CALCIUM 600 + D PO) Take 1 tablet by mouth 2 (two) times daily.   Yes [provider]  carvedilol (COREG) 12.5 MG tablet TAKE 1 TABLET BY MOUTH TWICE (2) DAILY WITH A MEAL 11/12/18  Yes Rise Mu, PA-C  citalopram (CELEXA) 20 MG tablet Take 1 tablet (20 mg total) by mouth daily. 04/15/18  Yes Wardell Honour, MD  clopidogrel (PLAVIX) 75 MG tablet Take 1 tablet (75 mg total) by mouth daily. 10/10/18  Yes Salary, Montell D, MD  ferrous sulfate 325 (65 FE) MG tablet Take 325 mg by mouth daily.   Yes [provider]  glimepiride (AMARYL) 4 MG tablet Take 1 tablet (4 mg total) by mouth 2 (two) times daily. 10/22/18  Yes Rutherford Guys, MD  insulin glargine (LANTUS) 100 UNIT/ML injection Inject 0.16 mLs (16 Units total) into the skin at bedtime. 10/16/18  Yes Wieting, Richard, MD  losartan (COZAAR) 50 MG tablet  Take 1 tablet (50 mg total) by mouth daily for 30 days. 11/04/18 12/04/18 Yes Pyreddy, Reatha Harps, MD  omega-3 acid ethyl esters (LOVAZA) 1 g capsule Take 1 g by mouth 2 (two) times daily.   Yes [provider]  rosuvastatin (CRESTOR) 40 MG tablet TAKE 1 TABLET BY MOUTH ONCE DAILY Patient taking differently: Take 40 mg by mouth at bedtime.  12/16/17  Yes Minna Merritts, MD  torsemide (DEMADEX) 20 MG tablet Take 40 mg orally twice daily alternating with 40 mg once daily 11/04/18  Yes Pyreddy, Pavan, MD  AMBULATORY NON FORMULARY MEDICATION 1 Units by Other route once. Medication Name: foot brace 11/15/15   Narda Amber K, DO  glucose blood (ONE TOUCH ULTRA TEST) test strip Check sugar three times daily dx: DMII uncontrolled insulin dependent with complications 3/55/73   Wardell Honour, MD  Insulin Pen Needle (B-D ULTRAFINE III SHORT PEN) 31G X 8 MM MISC USE AS DIRECTED WITH LANTUS; dx: DMII uncontrolled, insulin dependent, with complications 11/27/23   Wardell Honour, MD  nitroGLYCERIN (NITROSTAT) 0.4 MG SL tablet Place 1 tablet (0.4 mg total) under the tongue every 5 (five) minutes as needed for chest pain. 10/09/18   Minna Merritts, MD  sodium chloride flush (NS) 0.9 % SOLN 20 mLs by Intracatheter route every 8 (eight) hours. 10/16/18   Loletha Grayer, MD    Inpatient Medications: Scheduled Meds: . acetaminophen  650 mg Oral Once  . [START ON 11/23/2018] aspirin EC  81 mg Oral Daily  . [START ON 11/23/2018] clopidogrel  75 mg Oral Daily  . diphenhydrAMINE  25 mg Intravenous Once  . [START ON 11/23/2018] ferrous sulfate  325 mg Oral Daily  . insulin glargine  16 Units Subcutaneous QHS  . omega-3 acid ethyl esters  1 g Oral BID  . rosuvastatin  40 mg Oral QHS  . vancomycin variable dose per unstable renal function (pharmacist dosing)   Does not apply See admin instructions   Continuous Infusions: . sodium chloride    . ceFEPime (MAXIPIME) IV    . DOPamine Stopped (11/22/18 1448)  .  famotidine (PEPCID) IV    . norepinephrine (LEVOPHED) Adult infusion 8 mcg/min (11/22/18 1502)   PRN Meds: acetaminophen, albuterol, nitroGLYCERIN, ondansetron (ZOFRAN) IV  Allergies:   No Known Allergies  Social History:   Social History   Socioeconomic History  . Marital status: Married    Spouse name: [name]  . Number of children: 2  . Years of education: 19  . Highest education level: High school graduate  Occupational History  . Occupation: disabled    Fish farm manager: UNEMPLOYED    Comment: 03/2010 for peripheral neuropathy  . 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  Social Needs  .  Financial resource strain: Not hard at all  . Food insecurity:    Worry: Never true    Inability: Never true  . Transportation needs:    Medical: No    Non-medical: No  Tobacco Use  . Smoking status: Never Smoker  . Smokeless tobacco: Never Used  Substance and Sexual Activity  . Alcohol use: No    Alcohol/week: 0.0 standard drinks  . Drug use: No  . Sexual activity: Never    Birth control/protection: Post-menopausal, Surgical  Lifestyle  . Physical activity:    Days per week: 0 days    Minutes per session: 0 min  . Stress: Rather much  Relationships  . Social connections:    Talks on phone: More than three times a week    Gets together: More than three times a week    Attends religious service: Never    Active member of club or organization: No    Attends meetings of clubs or organizations: Never    Relationship status: Married  . Intimate partner violence:    Fear of current or ex partner: No    Emotionally abused: No    Physically abused: No    Forced sexual activity: No  Other Topics Concern  . Not on file  Social History Narrative   Marital status:Married x 45 yrs. Happily married, no abuse.       Children:  2 children (daughter 58, son 59). Two grandsons and 2 step grandchildren.  1 gg.      Lives: with husband, daughter, son-in-law, 2 grandsons, and 1 on the way.        Employment: disability for peripheral neuropathy 2012.  Previously had home daycare.      Tobacco: none      Alcohol: none      Drugs: none      Exercise: none. [employer].      Pets: dog.      Always uses seat belts, smoke detectors in home.      No guns in the home.       Caffeine use: 2 cups coffee per day.       Nutrition: Well balanced diet.    Family History:    Family History  Problem Relation Age of Onset  . Diabetes Mother   . Hypertension Mother   . Arthritis Mother        knees, lumbar DDD, cervical DDD  . Cancer Father        prostate,skin,lymphoma.  . Cancer Brother 49       bladder cancer; non-smoker  . Diabetes Maternal Grandmother   . Heart disease Maternal Grandmother   . COPD Maternal Grandmother   . Diabetes Paternal Grandmother   . Obesity Brother   . Diabetes Son   . Hypertension Son   . Cancer Maternal Grandfather   . Diabetes Paternal Grandfather      ROS:  Please see the history of present illness.  Review of Systems  Constitutional: Positive for malaise/fatigue and weight loss.  HENT:       Dry mouth  Respiratory: Positive for shortness of breath.   Cardiovascular: Negative.   Gastrointestinal: Negative.   Musculoskeletal: Negative.   Neurological: Negative.   Psychiatric/Behavioral: Negative.   All other systems reviewed and are negative.    Physical Exam/Data:   Vitals:   11/22/18 1600 11/22/18 1700 11/22/18 1745 11/22/18 1800  BP: (!) 89/40 (!) 114/47 105/65 (!) 125/52  Pulse: 75 82 80 82  Resp: 11 20 16 18   Temp:   98.4 F (36.9 C)   TempSrc:   Oral   SpO2: 95% 99% 100% 98%  Weight:      Height:        Intake/Output Summary (Last 24 hours) at 11/22/2018 1838 Last data filed at 11/22/2018 1745 Gross per 24 hour  Intake 2581.25 ml  Output -  Net 2581.25 ml   Last 3 Weights 11/22/2018 11/22/2018 11/11/2018  Weight (lbs) 283 lb 1.1 oz 293 lb 292 lb 8 oz  Weight (kg) 128.4 kg 132.904 kg 132.677 kg  Some  encounter information is confidential and restricted. Go to Review Flowsheets activity to see all data.     Body mass index is 43.04 kg/m.  General: No significant distress, appears pale, weak HEENT: normal Lymph: no adenopathy Neck: No significant JVD Endocrine:  No thryomegaly Vascular: No carotid bruits; FA pulses 2+ bilaterally without bruits  Cardiac:  normal S1, S2; RRR; 2/6 systolic ejection murmur appreciated left sternal border Lungs:  clear to auscultation bilaterally, no wheezing, rhonchi or rales  Abd: soft, nontender, no hepatomegaly  Ext: no edema Musculoskeletal:  No deformities, BUE and BLE strength normal and equal Skin: warm and dry  Neuro:  CNs 2-12 intact, no focal abnormalities noted Psych:  Normal affect   EKG:  The EKG was personally reviewed and demonstrates: Shows atrial sensed V paced rhythm rate 78 bpm Telemetry:  Telemetry was personally reviewed and demonstrates: Sinus rhythm  Relevant CV Studies:  LHC 10/05/18  Prox Cx to Dist Cx lesion is 80% stenosed.  Prox LAD-1 lesion is 40% stenosed.  Prox LAD-2 lesion is 90% stenosed.  Prox LAD to Mid LAD lesion is 80% stenosed.  Mid LAD to Dist LAD lesion is 70% stenosed.  1st Diag lesion is 90% stenosed.  Ost 1st Diag lesion is 80% stenosed.  Mid RCA-1 lesion is 40% stenosed.  Mid RCA-2 lesion is 40% stenosed. Final Conclusions:  Severe disease of LAD and diagonal branch Patent proximal LAD stent with severe stenosis at the distal edge of the stent Remainder of the LAD is also small, diffusely diseased, diagonal branch diffusely diseased -RCA large vessel dominant with mild disease, left circumflex/OM with mild disease proximal region  Recommendations:  Case discussed with interventional cardiology, Placing a short stent in the distal edge of the LAD stent may provide little clinical benefit with high risk and may delay surgery on her osteomyelitis/toe infection. Troponin elevation likely  secondary to demand ischemia in the setting of febrile, tachycardic, hypertensive on arrival in the setting of bacteremia --Would favor medical management at this time and proceeding with removal of her toe.   TEE 10/02/18 - Left ventricle: Systolic function was mildly reduced. The estimated ejection fraction was in the range of 45% to 50%. - Aortic valve: No evidence of vegetation. There was trivial regurgitation. - Mitral valve: There was mild regurgitation. - Left atrium: No evidence of thrombus in the atrial cavity or appendage. - Right atrium: No evidence of thrombus in the atrial cavity or appendage. - Atrial septum: Echo contrast study showed no right-to-left atrial level shunt, at baseline or with provocation. - Pulmonic valve: No evidence of vegetation.  Impressions: - No evidence of valve vegetation or wire associated vegetation.  TTE 09/29/18 - Left ventricle: The cavity size was normal. Systolic function was mildly reduced. The estimated ejection fraction was in the range of 45% to 50%. The study is not technically sufficient to allow evaluation of LV  diastolic function. - Left atrium: The atrium was mildly dilated.  Impressions: - Very suboptimal study. No evidence of vegetations. Consider TEE.   Laboratory Data:  Chemistry Recent Labs  Lab 11/22/18 1116  NA 132*  K 4.1  CL 93*  CO2 27  GLUCOSE 251*  BUN 58*  CREATININE 3.73*  CALCIUM 7.5*  GFRNONAA 12*  GFRAA 14*  ANIONGAP 12    Recent Labs  Lab 11/22/18 1116  PROT 6.6  ALBUMIN 2.9*  AST 197*  ALT 33  ALKPHOS 43  BILITOT 0.8   Hematology Recent Labs  Lab 11/22/18 1116  WBC 4.8  RBC 2.44*  HGB 7.0*  HCT 22.4*  MCV 91.8  MCH 28.7  MCHC 31.3  RDW 13.9  PLT 84*   Cardiac Enzymes Recent Labs  Lab 11/22/18 1116  TROPONINI 9.30*   No results for input(s): TROPIPOC in the last 168 hours.  BNP Recent Labs  Lab 11/22/18 1116  BNP 2,246.0*    DDimer No  results for input(s): DDIMER in the last 168 hours.  Radiology/Studies:  Dg Chest Portable 1 View  Result Date: 11/22/2018 CLINICAL DATA:  Altered mental status. Treated for pneumonia 2 weeks ago. EXAM: PORTABLE CHEST 1 VIEW COMPARISON:  Radiographs 10/30/2018 and 10/27/2018. FINDINGS: 1121 hours. Two views submitted. Right arm PICC extends to the superior cavoatrial junction. Left subclavian defibrillator leads appear grossly unchanged within the right atrium, right ventricle and coronary sinus. There is stable mild cardiomegaly and aortic atherosclerosis. Pulmonary edema has improved. There is stable patchy opacity at the right lung base. Veiling opacity in the right perihilar region has increased, suspicious for fluid in the fissure, less likely consolidation in the superior segment of the lower lobe. IMPRESSION: 1. Interval increased right perihilar opacity, probably fluid in the fissure versus consolidation in the superior segment of the lower lobe. 2. Patchy right basilar airspace disease is unchanged. No definite residual edema. Electronically Signed   By: Richardean Sale M.D.   On: 11/22/2018 12:22    Assessment and Plan:   1) hypovolemic shock Not eating drinking past several days Malaise possibly secondary to pancytopenia, worsening renal failure -Plan is to give some IV fluids, packed red blood cells Antiplatelet medication held given markedly depressed platelets and hematocrit -Blood pressure support with nor epi, appears to be working systolic pressure now 170Y and mentating  Better.  This can be weaned as blood pressure improves with fluids and packed red blood cells --Blood cultures pending given recent MSSA bacteremia --Etiology of her general malaise and pancytopenia concerning for side effect from cefazolin 2 g given every 8 hours IV through PICC line over the past 10 days  2) elevated troponin Likely demand ischemia in the setting of known severe coronary disease,  Hypoxia,  hypertension No plan at this time for intervention or catheterization given known disease, likely of little clinical benefit, severely depressed blood count and platelets Would not start heparin infusion given pancytopenia -She does not report unstable angina symptoms  3) acute renal failure Well above her baseline of 1.5 now greater than 3 Mouth dry, low blood pressure consistent with hypovolemia -Would recommend slow hydration  4) altered mental status Likely secondary to hypotension, hypoxia Seems to be improving with better blood pressure support She is on broad-spectrum antibiotics and blood cultures are pending  5) CAD with stable angina Cardiac catheterization December 2019, severe diffuse disease No plan for intervention at this time  5) osteomyelitis with toe amputation Recent discharge from the hospital  following surgery with podiatry Completed her antibiotics for MSSA bacteremia Wound evaluated, appears to be healing well If there is positive cultures will need to reevaluate her foot, also reevaluate pacer wires   Long discussion with emergency room physician concerning the plan for hypovolemic shock.  Plan also discussed with intensivist in detail Each of the details above discussed  Total encounter time more than 110 minutes  Greater than 50% was spent in counseling and coordination of care with the patient   For questions or updates, please contact [place] Please consult [email or web address] for contact info under     Signed, Ida Rogue, MD  11/22/2018 6:38 PM

## 2018-11-22 NOTE — Progress Notes (Signed)
Pharmacy Antibiotic Note  [name] is a 63 y.o. female admitted on 11/22/2018 with sepsis/PNA  Pharmacy has been consulted for Vancomycin dosing. -Acute renal failure, DM, Recent MSSA bacteremia/osteomyelitis of the toe- completed IV abx course  Cefepime dose adjusted to 1 gram IV q12h per abx renal adjustment consult  Plan: Patient received 1 gram IV Vancomycin in ER. Will order another 1 gram for a total Loading dose of 2000 mg.  With Crcl ~ 22 ml/min and acute renal failure, will check random level in 24 hrs to assess further dosing. F/u renal fxn.   Height: 5\' 8"  (172.7 cm) Weight: 293 lb (132.9 kg) IBW/kg (Calculated) : 63.9  Temp (24hrs), Avg:99.2 F (37.3 C), Min:97.2 F (36.2 C), Max:102 F (38.9 C)  Recent Labs  Lab 11/22/18 1116 11/22/18 1306  WBC 4.8  --   CREATININE 3.73*  --   LATICACIDVEN 2.0* 1.7    Estimated Creatinine Clearance: 22 mL/min (A) (by C-G formula based on SCr of 3.73 mg/dL (H)).    No Known Allergies  Antimicrobials this admission: Zosyn x 1 in ER   >>   Cefepime  2/16 >>   Vanc 2/16 >>  Dose adjustments this admission:    Microbiology results: 2/16 BCx: pend   UCx:      Sputum:    2/16 MRSA PCR: pend  Thank you for allowing pharmacy to be a part of this patient's care.  Kaylanie Capili A 11/22/2018 3:26 PM

## 2018-11-22 NOTE — Progress Notes (Addendum)
      INFECTIOUS DISEASE ATTENDING ADDENDUM:   Date: 11/22/2018  Patient name: Nyhla Mountjoy [name]  Medical record number: [id number]  Date of birth: 06/01/1956        [facility] Antimicrobial Management Team Staphylococcus aureus bacteremia   Staphylococcus aureus bacteremia (SAB) is associated with a high rate of complications and mortality.  Specific aspects of clinical management are critical to optimizing the outcome of patients with SAB.  Therefore, the [facility] Antimicrobial Management Team [facility]) has initiated an intervention aimed at improving the management of SAB at [facility].  To do so, Infectious Diseases physicians are providing an evidence-based consult for the management of all patients with SAB.     Yes No Comments  Perform follow-up blood cultures (even if the patient is afebrile) to ensure clearance of bacteremia [x]  []    Remove vascular catheter and obtain follow-up blood cultures after the removal of the catheter [x]  []    Perform echocardiography to evaluate for endocarditis (transthoracic ECHO is 40-50% sensitive, TEE is > 90% sensitive) [x]  []  Please keep in mind, that neither test can definitively EXCLUDE endocarditis, and that should clinical suspicion remain high for endocarditis the patient should then still be treated with an "endocarditis" duration of therapy = 6 weeks  Consult electrophysiologist to evaluate implanted cardiac device (pacemaker, ICD) [x]  []  Patient has ICD  Ensure source control [x]  []  Have all abscesses been drained effectively? Have deep seeded infections (septic joints or osteomyelitis) had appropriate surgical debridement?  Investigate for "metastatic" sites of infection [x]  []  Does the patient have ANY symptom or physical exam finding that would suggest a deeper infection (back or neck pain that may be suggestive of vertebral osteomyelitis or epidural abscess, muscle pain that could be a symptom of pyomyositis)?  Keep in mind that for  deep seeded infections MRI imaging with contrast is preferred rather than other often insensitive tests such as plain x-rays, especially early in a patient's presentation.  Change antibiotic therapy to vancomycin due to concers about beta lactam causing pancytopenia []  []  Beta-lactam antibiotics are preferred for MSSA due to higher cure rates.   If on Vancomycin, goal trough should be 15 - 20 mcg/mL  Estimated duration of IV antibiotic therapy:  6 weeks after source control and removal of ICD [x]  []  Consult case management for probably prolonged outpatient IV antibiotic therapy    I suspect patient may have residual infection at osteomyelitis site.  Certainly her ICD has to come out MSSAB with ICD = by definition infection of the device and despite appearance on TEE of leads, valves, the device was clearly seeded and decleared this fact with recurrent bacteremia and must be removed to effect cure  Will let Dr. Delaine Lame know of pt and she will see tomorrowl   [name] 11/22/2018, 10:48 PM

## 2018-11-22 NOTE — H&P (Addendum)
[place] at  NAME: [name]    MR#:  [id number]  DATE OF BIRTH:  [birth year]  DATE OF ADMISSION:  11/22/2018  PRIMARY CARE PHYSICIAN: Rutherford Guys, MD   REQUESTING/REFERRING PHYSICIAN:   CHIEF COMPLAINT:   Chief Complaint  Patient presents with  . Altered Mental Status    HISTORY OF PRESENT ILLNESS: [name]  is a 63 y.o. female with a known history of myocardial infarction in the past, systolic heart failure with EF of 35%, cardioverter defibrillator, coronary artery disease, history of osteomyelitis, neuropathy, obesity, hyperlipidemia, type 2 diabetes mellitus, chronic kidney disease, recently discharged from the hospital 2 weeks ago for heart failure exacerbation, recently completed antibiotic course for MSSA bacteremia, presents from home with altered mental status, complaints of shortness of breath, brought via EMS, noted to be hypoxic, placed on oxygen via nasal cannula is poor historian due to encephalopathy/confusion, ER work-up noted for mild tachycardia, hypotension with blood pressure in 70s, O2 saturation in the 80s, creatinine 3.7 with baseline 1.5, hemoglobin 7, platelet count 84, troponin 9.3, BNP greater than 2200, ED attending did discuss case with Dr. Rockey Situ who recommended heparin drip for non-STEMI, code sepsis initiated in the emergency room, chest x-ray noted for questionable pneumonia, patient evaluated in the emergency room, significant other is at the bedside, patient is now being admitted for acute non-STEMI, acute kidney injury with chronic kidney disease stage III, acute on chronic worsening symptomatic anemia, and ?  Sepsis.  PAST MEDICAL HISTORY:   Past Medical History:  Diagnosis Date  . Acute myocardial infarction, subendocardial infarction, initial episode of care ([place]) 07/21/2012  . Acute osteomyelitis involving ankle and foot ([place]) 02/06/2015  . Acute systolic heart failure ([place]) 07/21/2012    New onset 07/19/12; admission to [facility] ED. Elevated Troponins.  S/p 2D-echo with EF 20-25%.  S/p cardiac catheterization with stenting LAD.  Repeat 2D-echo 10/2011 with improved EF of 35%.   . Anemia   . Automatic implantable cardioverter-defibrillator in situ    a. MDT CRT-D 06/2014, SN: [id number]    . CAD (coronary artery disease)    a. cardiac cath 101/04/2012: PCI/DES to chronically occluded mLAD, consideration PCI to diag branch in 4 weeks.   . Cataract   . Chicken pox   . Chronic kidney disease   . Chronic systolic CHF (congestive heart failure) (HCC)    a. mixed ICM & NICM; b. EF 20-25% by echo 07/2012, mid-dist 2/3 of LV sev HK/AK, mild MR. echo 10/2012: EF 30-35%, sev HK ant-septal & inf walls, GR1DD, mild MR, PASP 33. c. echo 02/2013: EF 30%, GR1DD, mild MR. echo 04/2014: EF 30%, Septal-lat dyssynchrony, global HK, inf AK, GR1DD, mild MR. d. echo 10/2014: EF50-55%, WM nl, GR1DD, septal mild paradox. e. echo 02/2015: EF 50-55%, wm   . Depression   . Heart attack ([place])   . Heel ulcer ([place]) 04/27/2015  . History of blood transfusion ~ 2011   "plasma; had neuropathy; couldn't walk"  . Hypertension   . LBBB (left bundle branch block)   . Neuromuscular disorder ([place])   . Neuropathy 2011  . Obesity, unspecified   . OSA on CPAP    Moderate with AHI 23/hr and now on CPAP at 16cm H2O.  Her DME is [facility]  . Pure hypercholesterolemia   . Type II diabetes mellitus ([place])   . Unspecified vitamin D deficiency     PAST SURGICAL HISTORY:  Past Surgical History:  Procedure Laterality Date  . AMPUTATION TOE Right 06/18/2015   Procedure: AMPUTATION TOE;  Surgeon: Samara Deist, DPM;  Location: [facility] ORS;  Service: Podiatry;  Laterality: Right;  . AMPUTATION TOE Left 10/08/2018   Procedure: AMPUTATION TOE LEFT 2ND;  Surgeon: Albertine Patricia, DPM;  Location: [facility] ORS;  Service: Podiatry;  Laterality: Left;  . BACK SURGERY    . BI-VENTRICULAR IMPLANTABLE CARDIOVERTER DEFIBRILLATOR N/A 07/06/2014    Procedure: BI-VENTRICULAR IMPLANTABLE CARDIOVERTER DEFIBRILLATOR  (CRT-D);  Surgeon: Deboraha Sprang, MD;  Location: [facility] CATH LAB;  Service: Cardiovascular;  Laterality: N/A;  . BI-VENTRICULAR IMPLANTABLE CARDIOVERTER DEFIBRILLATOR  (CRT-D)  07/06/2014  . BILATERAL OOPHORECTOMY  01/2011   ovarian cyst benign  . COLONOSCOPY WITH PROPOFOL Left 02/22/2015   Procedure: COLONOSCOPY WITH PROPOFOL;  Surgeon: Hulen Luster, MD;  Location: [facility] ENDOSCOPY;  Service: Endoscopy;  Laterality: Left;  . CORONARY ANGIOPLASTY WITH STENT PLACEMENT Left 07/2012   new onset systolic CHF; elevated troponins.  Cardiac catheterization with stenting to LAD; EF 15%.  2D-echo: EF 20-25%.  . ESOPHAGOGASTRODUODENOSCOPY N/A 02/22/2015   Procedure: ESOPHAGOGASTRODUODENOSCOPY (EGD);  Surgeon: Hulen Luster, MD;  Location: [facility] ENDOSCOPY;  Service: Endoscopy;  Laterality: N/A;  . INCISION AND DRAINAGE ABSCESS Right 2007   groin; with ICU stay due to sepsis.  Marland Kitchen LAPAROSCOPIC CHOLECYSTECTOMY  2011  . LEFT HEART CATH AND CORONARY ANGIOGRAPHY N/A 10/05/2018   Procedure: LEFT HEART CATH AND CORONARY ANGIOGRAPHY;  Surgeon: Minna Merritts, MD;  Location: [place] CV LAB;  Service: Cardiovascular;  Laterality: N/A;  . LEFT HEART CATHETERIZATION WITH CORONARY ANGIOGRAM N/A 07/21/2012   Procedure: LEFT HEART CATHETERIZATION WITH CORONARY ANGIOGRAM;  Surgeon: Jolaine Artist, MD;  Location[facility] CATH LAB;  Service: Cardiovascular;  Laterality: N/A;  . [place]   L4-5  . PERCUTANEOUS CORONARY STENT INTERVENTION (PCI-S) N/A 07/23/2012   Procedure: PERCUTANEOUS CORONARY STENT INTERVENTION (PCI-S);  Surgeon: Sherren Mocha, MD;  Location: [facility] CATH LAB;  Service: Cardiovascular;  Laterality: N/A;  . PERIPHERAL VASCULAR BALLOON ANGIOPLASTY Left 10/06/2018   Procedure: PERIPHERAL VASCULAR BALLOON ANGIOPLASTY;  Surgeon: Katha Cabal, MD;  Location: [place] CV LAB;  Service: Cardiovascular;  Laterality: Left;  .  PERIPHERAL VASCULAR CATHETERIZATION N/A 02/10/2015   Procedure: Picc Line Insertion;  Surgeon: Katha Cabal, MD;  Location: [place] CV LAB;  Service: Cardiovascular;  Laterality: N/A;  . TEE WITHOUT CARDIOVERSION N/A 10/02/2018   Procedure: TRANSESOPHAGEAL ECHOCARDIOGRAM (TEE);  Surgeon: Wellington Hampshire, MD;  Location: [facility] ORS;  Service: Cardiovascular;  Laterality: N/A;  . [place]  . VAGINAL HYSTERECTOMY  01/2011   Fibroids/DUB.  Ovaries removed. [place].    SOCIAL HISTORY:  Social History   Tobacco Use  . Smoking status: Never Smoker  . Smokeless tobacco: Never Used  Substance Use Topics  . Alcohol use: No    Alcohol/week: 0.0 standard drinks    FAMILY HISTORY:  Family History  Problem Relation Age of Onset  . Diabetes Mother   . Hypertension Mother   . Arthritis Mother        knees, lumbar DDD, cervical DDD  . Cancer Father        prostate,skin,lymphoma.  . Cancer Brother 58       bladder cancer; non-smoker  . Diabetes Maternal Grandmother   . Heart disease Maternal Grandmother   . COPD Maternal Grandmother   . Diabetes Paternal Grandmother   . Obesity Brother   . Diabetes Son   . Hypertension  Son   . Cancer Maternal Grandfather   . Diabetes Paternal Grandfather     DRUG ALLERGIES: No Known Allergies  REVIEW OF SYSTEMS: Unable to be obtained given encephalopathy  CONSTITUTIONAL: No fever, fatigue or weakness.  EYES: No blurred or double vision.  EARS, NOSE, AND THROAT: No tinnitus or ear pain.  RESPIRATORY: No cough, shortness of breath, wheezing or hemoptysis.  CARDIOVASCULAR: No chest pain, orthopnea, edema.  GASTROINTESTINAL: No nausea, vomiting, diarrhea or abdominal pain.  GENITOURINARY: No dysuria, hematuria.  ENDOCRINE: No polyuria, nocturia,  HEMATOLOGY: No anemia, easy bruising or bleeding SKIN: No rash or lesion. MUSCULOSKELETAL: No joint pain or arthritis.   NEUROLOGIC: No tingling, numbness, weakness.  PSYCHIATRY: No  anxiety or depression.   MEDICATIONS AT HOME:  Prior to Admission medications   Medication Sig Start Date End Date Taking? Authorizing Provider  aspirin EC 81 MG EC tablet Take 1 tablet (81 mg total) by mouth daily. 10/10/18  Yes Salary, Avel Peace, MD  Calcium Carb-Cholecalciferol (CALCIUM 600 + D PO) Take 1 tablet by mouth 2 (two) times daily.   Yes [provider]  carvedilol (COREG) 12.5 MG tablet TAKE 1 TABLET BY MOUTH TWICE (2) DAILY WITH A MEAL 11/12/18  Yes Dunn, Ryan M, PA-C  citalopram (CELEXA) 20 MG tablet Take 1 tablet (20 mg total) by mouth daily. 04/15/18  Yes Wardell Honour, MD  clopidogrel (PLAVIX) 75 MG tablet Take 1 tablet (75 mg total) by mouth daily. 10/10/18  Yes Salary, Montell D, MD  ferrous sulfate 325 (65 FE) MG tablet Take 325 mg by mouth daily.   Yes [provider]  glimepiride (AMARYL) 4 MG tablet Take 1 tablet (4 mg total) by mouth 2 (two) times daily. 10/22/18  Yes Rutherford Guys, MD  insulin glargine (LANTUS) 100 UNIT/ML injection Inject 0.16 mLs (16 Units total) into the skin at bedtime. 10/16/18  Yes Wieting, Richard, MD  losartan (COZAAR) 50 MG tablet Take 1 tablet (50 mg total) by mouth daily for 30 days. 11/04/18 12/04/18 Yes Pyreddy, Reatha Harps, MD  omega-3 acid ethyl esters (LOVAZA) 1 g capsule Take 1 g by mouth 2 (two) times daily.   Yes [provider]  rosuvastatin (CRESTOR) 40 MG tablet TAKE 1 TABLET BY MOUTH ONCE DAILY Patient taking differently: Take 40 mg by mouth at bedtime.  12/16/17  Yes Minna Merritts, MD  torsemide (DEMADEX) 20 MG tablet Take 40 mg orally twice daily alternating with 40 mg once daily 11/04/18  Yes Pyreddy, Pavan, MD  AMBULATORY NON FORMULARY MEDICATION 1 Units by Other route once. Medication Name: foot brace 11/15/15   Narda Amber K, DO  glucose blood (ONE TOUCH ULTRA TEST) test strip Check sugar three times daily dx: DMII uncontrolled insulin dependent with complications 1/61/09   Wardell Honour, MD  Insulin Pen  Needle (B-D ULTRAFINE III SHORT PEN) 31G X 8 MM MISC USE AS DIRECTED WITH LANTUS; dx: DMII uncontrolled, insulin dependent, with complications 03/10/53   Wardell Honour, MD  nitroGLYCERIN (NITROSTAT) 0.4 MG SL tablet Place 1 tablet (0.4 mg total) under the tongue every 5 (five) minutes as needed for chest pain. 10/09/18   Minna Merritts, MD  sodium chloride flush (NS) 0.9 % SOLN 20 mLs by Intracatheter route every 8 (eight) hours. 10/16/18   Wieting, Richard, MD      PHYSICAL EXAMINATION:   VITAL SIGNS: Blood pressure (!) 79/40, pulse 70, temperature (!) 102 F (38.9 C), resp. rate (!) 0, height 5'  8" (1.727 m), weight 132.9 kg, SpO2 92 %.  GENERAL:  63 y.o.-year-old patient lying in the bed with no acute distress.  Obese, nontoxic-appearing EYES: Pupils equal, round, reactive to light and accommodation. No scleral icterus. Extraocular muscles intact.  HEENT: Head atraumatic, normocephalic. Oropharynx and nasopharynx clear.  Dry mucous membranes  nECK:  Supple, no jugular venous distention. No thyroid enlargement, no tenderness.  Poor skin turgor LUNGS: Normal breath sounds bilaterally, no wheezing, rales,rhonchi or crepitation. No use of accessory muscles of respiration.  CARDIOVASCULAR: S1, S2 normal. No murmurs, rubs, or gallops.  ABDOMEN: Soft, nontender, nondistended. Bowel sounds present. No organomegaly or mass.  EXTREMITIES: No pedal edema, cyanosis, or clubbing.  NEUROLOGIC: Cranial nerves II through XII are intact. MAES. Gait not checked.  PSYCHIATRIC: The patient is confused Ox 1-2.  SKIN: No obvious rash, lesion, or ulcer.   LABORATORY PANEL:   CBC Recent Labs  Lab 11/22/18 1116  WBC 4.8  HGB 7.0*  HCT 22.4*  PLT 84*  MCV 91.8  MCH 28.7  MCHC 31.3  RDW 13.9  LYMPHSABS 0.3*  MONOABS 0.3  EOSABS 0.1  BASOSABS 0.0   ------------------------------------------------------------------------------------------------------------------  Chemistries  Recent Labs  Lab  11/22/18 1116  NA 132*  K 4.1  CL 93*  CO2 27  GLUCOSE 251*  BUN 58*  CREATININE 3.73*  CALCIUM 7.5*  AST 197*  ALT 33  ALKPHOS 43  BILITOT 0.8   ------------------------------------------------------------------------------------------------------------------ estimated creatinine clearance is 22 mL/min (A) (by C-G formula based on SCr of 3.73 mg/dL (H)). ------------------------------------------------------------------------------------------------------------------ No results for input(s): TSH, T4TOTAL, T3FREE, THYROIDAB in the last 72 hours.  Invalid input(s): FREET3   Coagulation profile Recent Labs  Lab 11/22/18 1116  INR 1.50   ------------------------------------------------------------------------------------------------------------------- No results for input(s): DDIMER in the last 72 hours. -------------------------------------------------------------------------------------------------------------------  Cardiac Enzymes Recent Labs  Lab 11/22/18 1116  TROPONINI 9.30*   ------------------------------------------------------------------------------------------------------------------ Invalid input(s): POCBNP  ---------------------------------------------------------------------------------------------------------------  Urinalysis    Component Value Date/Time   COLORURINE YELLOW (A) 10/14/2018 1121   APPEARANCEUR CLEAR (A) 10/14/2018 1121   LABSPEC 1.006 10/14/2018 1121   PHURINE 5.0 10/14/2018 1121   GLUCOSEU NEGATIVE 10/14/2018 1121   HGBUR MODERATE (A) 10/14/2018 1121   BILIRUBINUR NEGATIVE 10/14/2018 1121   BILIRUBINUR negative 09/27/2017 1506   BILIRUBINUR neg 01/16/2015 1533   KETONESUR NEGATIVE 10/14/2018 1121   PROTEINUR 30 (A) 10/14/2018 1121   UROBILINOGEN 0.2 09/27/2017 1506   UROBILINOGEN 1.0 05/17/2010 0214   NITRITE NEGATIVE 10/14/2018 1121   LEUKOCYTESUR NEGATIVE 10/14/2018 1121     RADIOLOGY: Dg Chest Portable 1  View  Result Date: 11/22/2018 CLINICAL DATA:  Altered mental status. Treated for pneumonia 2 weeks ago. EXAM: PORTABLE CHEST 1 VIEW COMPARISON:  Radiographs 10/30/2018 and 10/27/2018. FINDINGS: 1121 hours. Two views submitted. Right arm PICC extends to the superior cavoatrial junction. Left subclavian defibrillator leads appear grossly unchanged within the right atrium, right ventricle and coronary sinus. There is stable mild cardiomegaly and aortic atherosclerosis. Pulmonary edema has improved. There is stable patchy opacity at the right lung base. Veiling opacity in the right perihilar region has increased, suspicious for fluid in the fissure, less likely consolidation in the superior segment of the lower lobe. IMPRESSION: 1. Interval increased right perihilar opacity, probably fluid in the fissure versus consolidation in the superior segment of the lower lobe. 2. Patchy right basilar airspace disease is unchanged. No definite residual edema. Electronically Signed   By: Richardean Sale M.D.   On: 11/22/2018 12:22  EKG: Orders placed or performed during the hospital encounter of 11/22/18  . ED EKG  . ED EKG  . EKG 12-Lead  . EKG 12-Lead    IMPRESSION AND PLAN: 63 year old female patient with history of myocardial infarction in the past, systolic heart failure with EF of 35%, cardioverter defibrillator, coronary artery disease, history of osteomyelitis, neuropathy, obesity, hyperlipidemia, type 2 diabetes mellitus, chronic kidney disease, presenting with altered mental status  *Acute non-STEMI with history of CAD/MI Noted elevated BNP greater than 2200, troponin 9.3, acute hypoxic respiratory failure, exacerbated by acute on chronic worsening symptomatic anemia, acute kidney injury with chronic kidney disease stage III Case was discussed with ED attending per above/Dr. Gollan-recommended heparin drip Admit to ICU on our ICU/ACS protocol, continue to cycle cardiac enzymes, cardiology to see,  continue heparin drip, antihypertensives on hold given severe hypotension, IV fluids for rehydration, transfuse 1 unit packed red blood cells, vasopressor support PRN, and continue close medical monitoring  *Acute suspected sepsis Sepsis protocol initiated in the emergency room -this may however be related to above in the setting of acute worsening symptomatic anemia, dehydration given acute renal failure Continue sepsis protocol for now, empiric cefepime/vancomycin for now, noted normal CBC normal differential, normal lactic acid level, check procalcitonin level, and continue close medical monitoring  *Acute abnormal chest x-ray/? HCAP Noted for questionable pneumonia and edema Antibiotics per above, check two-view chest x-ray this time as last 1 was a portable study, follow-up on procalcitonin level  *Acute on chronic hypoxic respiratory failure On 2 L via nasal cannula at night at home Most likely secondary to above in setting of worsening symptomatic anemia Supplemental oxygen wean as tolerated  *Acute on chronic worsening symptomatic anemia In the setting of acute renal failure, acute dehydration, hemoglobin 7 down from baseline around 8 Transfuse 1 unit packed red blood cells, IV fluids for rehydration, anemia work-up and treat as indicated  *Acute toxic metabolic encephalopathy Suspect due to non-STEMI, acute on chronic hypoxic respiratory failure and worsening anemia Neurochecks per routine, check ammonia level, RPR, aspiration/fall/skin care precautions while in house, and continue close medical monitoring  *Acute kidney injury with chronic kidney disease stage III Speck due to overdiuresis and dehydration Avoid nephrotoxic agents, IV fluids for rehydration, transfusion 1 unit packed red blood cells per above, nephrology consulted for expert opinion, BMP in the morning, strict I&O monitoring, daily weights  *chronic systolic heart failure without exacerbation  Diuretics and  antihypertensives on hold given hypotension, strict I&O monitoring, daily weights for now Most recent echocardiogram noted for EF of 40 to 45% with reduced systolic function Goal weight is 280 pounds to 285 pounds per old records  *Recent MSSA bacteremia/osteomyelitis of the toe Treated with course of IV antibiotics Consider discontinuation of PICC line  *Chronic type 2 diabetes mellitus Sliding scale insulin with Accu-Cheks per routine  *Chronic obstructive sleep apnea Continue CPAP at bedtime/as needed    All the records are reviewed and case discussed with ED provider. Management plans discussed with the patient, family and they are in agreement.  CODE STATUS:full Code Status History    Date Active Date Inactive Code Status Order ID Comments User Context   10/31/2018 0232 11/04/2018 1826 Full Code [id number]  Lance Coon, MD Inpatient   10/13/2018 0047 10/16/2018 1900 DNR [id number]  Gorden Harms, MD Inpatient   09/28/2018 1456 10/09/2018 1726 Full Code [id number]  Nicholes Mango, MD ED   11/11/2015 1411 11/12/2015 1635 Full Code [id number]  Vaughan Basta, MD Inpatient  06/17/2015 0148 06/19/2015 1659 Full Code [id number]  Harrie Foreman, MD Inpatient   02/17/2015 1330 02/24/2015 1925 DNR [id number]  Bettey Costa, MD ED   07/06/2014 1100 07/07/2014 1524 Full Code [id number]  Deboraha Sprang, MD Inpatient   07/22/2012 1427 07/26/2012 1507 DNR [id number]  Nolon Rod, DO Inpatient    Advance Directive Documentation     Most Recent Value  Type of Advance Directive  Living will  Pre-existing out of facility DNR order (yellow form or pink MOST form)  -  "MOST" Form in Place?  -       TOTAL TIME TAKING CARE OF THIS PATIENT: 45 minutes.    Avel Peace Salary M.D on 11/22/2018   Between 7am to 6pm - Pager - [phone]  After 6pm go to [email or web address] - password [email or web address] [place] [facility]  Office  [phone]  CC: Primary care physician; Rutherford Guys,  MD   Note: This dictation was prepared with Dragon dictation along with smaller phrase technology. Any transcriptional errors that result from this process are unintentional.

## 2018-11-22 NOTE — Progress Notes (Addendum)
PHARMACY - PHYSICIAN COMMUNICATION CRITICAL VALUE ALERT - BLOOD CULTURE IDENTIFICATION (BCID)  [name] is an 63 y.o. female who presented to [facility] on 11/22/2018 with a chief complaint of NSTEMI  Assessment:  Blood Cultures w/ 2/4 bottles same set growing GPC, BCID shows Staph aureus (methacillin resistance not detected)  Name of physician (or Provider) Contacted: Tukov  Current antibiotics: Cefepime and Vancomycin  Changes to prescribed antibiotics recommended:  Recommendations accepted by provider, will discontinue Cefepime and Vancomyin and order Cefazolin 2g IV q12h (renally adjusted)  Spoke to Dr. Patsey Berthold later in the evening. Patient was on Cefazolin prior to admission and she has developed pancytopenia. Concern that pancytopenia may be due to Cefazolin. Will stop the Cefazolin and go back to Vancomycin.   Results for orders placed or performed during the hospital encounter of 11/22/18  Blood Culture ID Panel (Reflexed) (Collected: 11/22/2018 11:16 AM)  Result Value Ref Range   Enterococcus species NOT DETECTED NOT DETECTED   Listeria monocytogenes NOT DETECTED NOT DETECTED   Staphylococcus species DETECTED (A) NOT DETECTED   Staphylococcus aureus (BCID) DETECTED (A) NOT DETECTED   Methicillin resistance NOT DETECTED NOT DETECTED   Streptococcus species NOT DETECTED NOT DETECTED   Streptococcus agalactiae NOT DETECTED NOT DETECTED   Streptococcus pneumoniae NOT DETECTED NOT DETECTED   Streptococcus pyogenes NOT DETECTED NOT DETECTED   Acinetobacter baumannii NOT DETECTED NOT DETECTED   Enterobacteriaceae species NOT DETECTED NOT DETECTED   Enterobacter cloacae complex NOT DETECTED NOT DETECTED   Escherichia coli NOT DETECTED NOT DETECTED   Klebsiella oxytoca NOT DETECTED NOT DETECTED   Klebsiella pneumoniae NOT DETECTED NOT DETECTED   Proteus species NOT DETECTED NOT DETECTED   Serratia marcescens NOT DETECTED NOT DETECTED   Haemophilus influenzae NOT DETECTED  NOT DETECTED   Neisseria meningitidis NOT DETECTED NOT DETECTED   Pseudomonas aeruginosa NOT DETECTED NOT DETECTED   Candida albicans NOT DETECTED NOT DETECTED   Candida glabrata NOT DETECTED NOT DETECTED   Candida krusei NOT DETECTED NOT DETECTED   Candida parapsilosis NOT DETECTED NOT DETECTED   Candida tropicalis NOT DETECTED NOT DETECTED    Paulina Fusi, PharmD, BCPS 11/22/2018 7:50 PM

## 2018-11-22 NOTE — ED Provider Notes (Addendum)
[facility] Emergency Department Provider Note  ____________________________________________  Time seen: Approximately 11:54 AM  I have reviewed the triage vital signs and the nursing notes.   HISTORY  Chief Complaint Altered Mental Status  Level 5 caveat:  Portions of the history and physical were unable to be obtained due to confusion   HPI [name] is a 63 y.o. female with a history of CAD, CHF with EF of 45 to 50%, AICD, chronic kidney disease, hypertension, OSA on CPAP, diabetes, hypertension, hyperlipidemia who presents for evaluation of altered mental status.  Patient recently admitted twice to the hospital.  One time with MSSA bacteremia from a foot infection in the second time for CHF exacerbation.  Still has PICC line in place however no longer receiving antibiotics.  Per EMS, patient coming from home.  This morning family had a hard time waking her up.  When they finally did patient was very somnolent and not as alert as normal.  When EMS arrived patient was hypoxic.  She has been using 2 L of oxygen at night since having the pneumonia 2 weeks ago.  Patient endorses cough but denies chest pain or shortness of breath, abdominal pain, vomiting or diarrhea.  Patient is slow to answer questions, looks extremely dry on exam and is confused.   Past Medical History:  Diagnosis Date  . Acute myocardial infarction, subendocardial infarction, initial episode of care ([place]) 07/21/2012  . Acute osteomyelitis involving ankle and foot ([place]) 02/06/2015  . Acute systolic heart failure ([place]) 07/21/2012   New onset 07/19/12; admission to [facility] ED. Elevated Troponins.  S/p 2D-echo with EF 20-25%.  S/p cardiac catheterization with stenting LAD.  Repeat 2D-echo 10/2011 with improved EF of 35%.   . Anemia   . Automatic implantable cardioverter-defibrillator in situ    a. MDT CRT-D 06/2014, SN: [id number]    . CAD (coronary artery disease)    a. cardiac cath  101/04/2012: PCI/DES to chronically occluded mLAD, consideration PCI to diag branch in 4 weeks.   . Cataract   . Chicken pox   . Chronic kidney disease   . Chronic systolic CHF (congestive heart failure) (HCC)    a. mixed ICM & NICM; b. EF 20-25% by echo 07/2012, mid-dist 2/3 of LV sev HK/AK, mild MR. echo 10/2012: EF 30-35%, sev HK ant-septal & inf walls, GR1DD, mild MR, PASP 33. c. echo 02/2013: EF 30%, GR1DD, mild MR. echo 04/2014: EF 30%, Septal-lat dyssynchrony, global HK, inf AK, GR1DD, mild MR. d. echo 10/2014: EF50-55%, WM nl, GR1DD, septal mild paradox. e. echo 02/2015: EF 50-55%, wm   . Depression   . Heart attack ([place])   . Heel ulcer ([place]) 04/27/2015  . History of blood transfusion ~ 2011   "plasma; had neuropathy; couldn't walk"  . Hypertension   . LBBB (left bundle branch block)   . Neuromuscular disorder ([place])   . Neuropathy 2011  . Obesity, unspecified   . OSA on CPAP    Moderate with AHI 23/hr and now on CPAP at 16cm H2O.  Her DME is [facility]  . Pure hypercholesterolemia   . Type II diabetes mellitus ([place])   . Unspecified vitamin D deficiency     Patient Active Problem List   Diagnosis Date Noted  . NSTEMI (non-ST elevated myocardial infarction) ([place]) 11/22/2018  . Acute exacerbation of CHF (congestive heart failure) ([place]) 10/31/2018  . Acute on chronic systolic CHF (congestive heart failure) ([place]) 10/28/2018  . Lymphedema 10/28/2018  .  History of amputation of lesser toe of right foot ([place]) 04/19/2018  . 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 Paraparesis of both lower limbs ([place]) 03/29/2018  . 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 Paroxysmal atrial fibrillation ([place]) 03/29/2018  . Class 3 obesity due to excess calories with serious comorbidity and body mass index (BMI) of 40.0 to 44.9 in adult 09/02/2016  . History of CVA (cerebrovascular accident) 11/11/2015  . Chronic renal insufficiency 08/04/2015  . OSA (obstructive sleep apnea)  08/15/2014  . Morbid obesity ([place]) 10/26/2013  . Essential hypertension, benign 04/29/2013  . Pure hypercholesterolemia 12/07/2012  . Iron deficiency anemia 12/07/2012  . Diabetic peripheral neuropathy associated with type 2 diabetes mellitus ([place]) 12/07/2012  . Chronic systolic congestive heart failure ([place]) 07/29/2012  . Left bundle-branch block 07/25/2012  . Atherosclerotic heart disease of native coronary artery without angina pectoris 07/25/2012    Past Surgical History:  Procedure Laterality Date  . AMPUTATION TOE Right 06/18/2015   Procedure: AMPUTATION TOE;  Surgeon: Samara Deist, DPM;  Location: [facility] ORS;  Service: Podiatry;  Laterality: Right;  . AMPUTATION TOE Left 10/08/2018   Procedure: AMPUTATION TOE LEFT 2ND;  Surgeon: Albertine Patricia, DPM;  Location: [facility] ORS;  Service: Podiatry;  Laterality: Left;  . BACK SURGERY    . BI-VENTRICULAR IMPLANTABLE CARDIOVERTER DEFIBRILLATOR N/A 07/06/2014   Procedure: BI-VENTRICULAR IMPLANTABLE CARDIOVERTER DEFIBRILLATOR  (CRT-D);  Surgeon: Deboraha Sprang, MD;  Location: [facility] CATH LAB;  Service: Cardiovascular;  Laterality: N/A;  . BI-VENTRICULAR IMPLANTABLE CARDIOVERTER DEFIBRILLATOR  (CRT-D)  07/06/2014  . BILATERAL OOPHORECTOMY  01/2011   ovarian cyst benign  . COLONOSCOPY WITH PROPOFOL Left 02/22/2015   Procedure: COLONOSCOPY WITH PROPOFOL;  Surgeon: Hulen Luster, MD;  Location: [facility] ENDOSCOPY;  Service: Endoscopy;  Laterality: Left;  . CORONARY ANGIOPLASTY WITH STENT PLACEMENT Left 07/2012   new onset systolic CHF; elevated troponins.  Cardiac catheterization with stenting to LAD; EF 15%.  2D-echo: EF 20-25%.  . ESOPHAGOGASTRODUODENOSCOPY N/A 02/22/2015   Procedure: ESOPHAGOGASTRODUODENOSCOPY (EGD);  Surgeon: Hulen Luster, MD;  Location: [facility] ENDOSCOPY;  Service: Endoscopy;  Laterality: N/A;  . INCISION AND DRAINAGE ABSCESS Right 2007   groin; with ICU stay due to sepsis.  Marland Kitchen LAPAROSCOPIC CHOLECYSTECTOMY  2011  . LEFT HEART CATH AND CORONARY  ANGIOGRAPHY N/A 10/05/2018   Procedure: LEFT HEART CATH AND CORONARY ANGIOGRAPHY;  Surgeon: Minna Merritts, MD;  Location: [place] CV LAB;  Service: Cardiovascular;  Laterality: N/A;  . LEFT HEART CATHETERIZATION WITH CORONARY ANGIOGRAM N/A 07/21/2012   Procedure: LEFT HEART CATHETERIZATION WITH CORONARY ANGIOGRAM;  Surgeon: Jolaine Artist, MD;  Location: [facility] CATH LAB;  Service: Cardiovascular;  Laterality: N/A;  . [place]   L4-5  . PERCUTANEOUS CORONARY STENT INTERVENTION (PCI-S) N/A 07/23/2012   Procedure: PERCUTANEOUS CORONARY STENT INTERVENTION (PCI-S);  Surgeon: Sherren Mocha, MD;  Location: [facility] CATH LAB;  Service: Cardiovascular;  Laterality: N/A;  . PERIPHERAL VASCULAR BALLOON ANGIOPLASTY Left 10/06/2018   Procedure: PERIPHERAL VASCULAR BALLOON ANGIOPLASTY;  Surgeon: Katha Cabal, MD;  Location: [place] CV LAB;  Service: Cardiovascular;  Laterality: Left;  . PERIPHERAL VASCULAR CATHETERIZATION N/A 02/10/2015   Procedure: Picc Line Insertion;  Surgeon: Katha Cabal, MD;  Location: [place] CV LAB;  Service: Cardiovascular;  Laterality: N/A;  . TEE WITHOUT CARDIOVERSION N/A 10/02/2018   Procedure: TRANSESOPHAGEAL ECHOCARDIOGRAM (TEE);  Surgeon: Wellington Hampshire, MD;  Location: [facility] ORS;  Service: Cardiovascular;  Laterality: N/A;  . [place]  .  VAGINAL HYSTERECTOMY  01/2011   Fibroids/DUB.  Ovaries removed. [place].    Prior to Admission medications   Medication Sig Start Date End Date Taking? Authorizing Provider  aspirin EC 81 MG EC tablet Take 1 tablet (81 mg total) by mouth daily. 10/10/18  Yes Salary, Avel Peace, MD  Calcium Carb-Cholecalciferol (CALCIUM 600 + D PO) Take 1 tablet by mouth 2 (two) times daily.   Yes [provider]  carvedilol (COREG) 12.5 MG tablet TAKE 1 TABLET BY MOUTH TWICE (2) DAILY WITH A MEAL 11/12/18  Yes Dunn, Ryan M, PA-C  citalopram (CELEXA) 20 MG tablet Take 1 tablet (20 mg total) by  mouth daily. 04/15/18  Yes Wardell Honour, MD  clopidogrel (PLAVIX) 75 MG tablet Take 1 tablet (75 mg total) by mouth daily. 10/10/18  Yes Salary, Montell D, MD  ferrous sulfate 325 (65 FE) MG tablet Take 325 mg by mouth daily.   Yes [provider]  glimepiride (AMARYL) 4 MG tablet Take 1 tablet (4 mg total) by mouth 2 (two) times daily. 10/22/18  Yes Rutherford Guys, MD  insulin glargine (LANTUS) 100 UNIT/ML injection Inject 0.16 mLs (16 Units total) into the skin at bedtime. 10/16/18  Yes Wieting, Richard, MD  losartan (COZAAR) 50 MG tablet Take 1 tablet (50 mg total) by mouth daily for 30 days. 11/04/18 12/04/18 Yes Pyreddy, Reatha Harps, MD  omega-3 acid ethyl esters (LOVAZA) 1 g capsule Take 1 g by mouth 2 (two) times daily.   Yes [provider]  rosuvastatin (CRESTOR) 40 MG tablet TAKE 1 TABLET BY MOUTH ONCE DAILY Patient taking differently: Take 40 mg by mouth at bedtime.  12/16/17  Yes Minna Merritts, MD  torsemide (DEMADEX) 20 MG tablet Take 40 mg orally twice daily alternating with 40 mg once daily 11/04/18  Yes Pyreddy, Pavan, MD  AMBULATORY NON FORMULARY MEDICATION 1 Units by Other route once. Medication Name: foot brace 11/15/15   Narda Amber K, DO  glucose blood (ONE TOUCH ULTRA TEST) test strip Check sugar three times daily dx: DMII uncontrolled insulin dependent with complications 0/71/21   Wardell Honour, MD  Insulin Pen Needle (B-D ULTRAFINE III SHORT PEN) 31G X 8 MM MISC USE AS DIRECTED WITH LANTUS; dx: DMII uncontrolled, insulin dependent, with complications 9/75/88   Wardell Honour, MD  nitroGLYCERIN (NITROSTAT) 0.4 MG SL tablet Place 1 tablet (0.4 mg total) under the tongue every 5 (five) minutes as needed for chest pain. 10/09/18   Minna Merritts, MD  sodium chloride flush (NS) 0.9 % SOLN 20 mLs by Intracatheter route every 8 (eight) hours. 10/16/18   Loletha Grayer, MD    Allergies Patient has no known allergies.  Family History  Problem Relation Age of Onset   . Diabetes Mother   . Hypertension Mother   . Arthritis Mother        knees, lumbar DDD, cervical DDD  . Cancer Father        prostate,skin,lymphoma.  . Cancer Brother 68       bladder cancer; non-smoker  . Diabetes Maternal Grandmother   . Heart disease Maternal Grandmother   . COPD Maternal Grandmother   . Diabetes Paternal Grandmother   . Obesity Brother   . Diabetes Son   . Hypertension Son   . Cancer Maternal Grandfather   . Diabetes Paternal Grandfather     Social History Social History   Tobacco Use  . Smoking status: Never Smoker  . Smokeless tobacco: Never Used  Substance Use Topics  . Alcohol use: No    Alcohol/week: 0.0 standard drinks  . Drug use: No    Review of Systems  Constitutional: + fever, confusion Cardiovascular: Negative for chest pain. Respiratory: Negative for shortness of breath. + cough Gastrointestinal: Negative for abdominal pain, vomiting or diarrhea. Genitourinary: Negative for dysuria. Musculoskeletal: Negative for back pain. Skin: Negative for rash. Neurological: Negative for headaches, weakness or numbness. Psych: No SI or HI  ____________________________________________   PHYSICAL EXAM:  VITAL SIGNS: ED Triage Vitals  Enc Vitals Group     BP 11/22/18 1121 (!) 82/45     Pulse Rate 11/22/18 1121 77     Resp 11/22/18 1121 16     Temp 11/22/18 1121 (!) 102 F (38.9 C)     Temp src --      SpO2 11/22/18 1105 (!) 80 %     Weight 11/22/18 1112 293 lb (132.9 kg)     Height 11/22/18 1112 5' 8" (1.727 m)     Head Circumference --      Peak Flow --      Pain Score --      Pain Loc --      Pain Edu? --      Excl. in [place]? --     Constitutional: Alert and oriented x 2, slow to answer questions.  HEENT:      Head: Normocephalic and atraumatic.         Eyes: Conjunctivae are normal. Sclera is non-icteric.       Mouth/Throat: Mucous membranes are dry and cracked.       Neck: Supple with no signs of  meningismus. Cardiovascular: Regular rate and rhythm. No murmurs, gallops, or rubs. 2+ symmetrical distal pulses are present in all extremities. No JVD. Respiratory: Increased work of breathing, hypoxic, diminished air movement bilaterally due to poor effort Gastrointestinal: Soft, non tender, and non distended with positive bowel sounds. No rebound or guarding. Musculoskeletal: Nontender with normal range of motion in all extremities. No edema, cyanosis, or erythema of extremities. Neurologic: Normal speech and language. Face is symmetric. Moving all extremities. No gross focal neurologic deficits are appreciated. Skin: Skin is warm, dry and intact. No rash noted. Psychiatric: Mood and affect are normal. Speech and behavior are normal.  ____________________________________________   LABS (all labs ordered are listed, but only abnormal results are displayed)  Labs Reviewed  CBC WITH DIFFERENTIAL/PLATELET - Abnormal; Notable for the following components:      Result Value   RBC 2.44 (*)    Hemoglobin 7.0 (*)    HCT 22.4 (*)    Platelets 84 (*)    Lymphs Abs 0.3 (*)    All other components within normal limits  COMPREHENSIVE METABOLIC PANEL - Abnormal; Notable for the following components:   Sodium 132 (*)    Chloride 93 (*)    Glucose, Bld 251 (*)    BUN 58 (*)    Creatinine, Ser 3.73 (*)    Calcium 7.5 (*)    Albumin 2.9 (*)    AST 197 (*)    GFR calc non Af Amer 12 (*)    GFR calc Af Amer 14 (*)    All other components within normal limits  BRAIN NATRIURETIC PEPTIDE - Abnormal; Notable for the following components:   B Natriuretic Peptide 2,246.0 (*)    All other components within normal limits  TROPONIN I - Abnormal; Notable for the following components:   Troponin I 9.30 (*)  All other components within normal limits  LACTIC ACID, PLASMA - Abnormal; Notable for the following components:   Lactic Acid, Venous 2.0 (*)    All other components within normal limits  APTT -  Abnormal; Notable for the following components:   aPTT 38 (*)    All other components within normal limits  PROTIME-INR - Abnormal; Notable for the following components:   Prothrombin Time 17.9 (*)    All other components within normal limits  CULTURE, BLOOD (ROUTINE X 2)  CULTURE, BLOOD (ROUTINE X 2)  INFLUENZA PANEL BY PCR (TYPE A & B)  LACTIC ACID, PLASMA  URINALYSIS, ROUTINE W REFLEX MICROSCOPIC  PREPARE RBC (CROSSMATCH)  TYPE AND SCREEN   ____________________________________________  EKG  ED ECG REPORT I, Rudene Re, the attending physician, personally viewed and interpreted this ECG.  Atrial sensed ventricular paced rhythm, rate of 78, prolonged QTC, left axis deviation, no ST elevations or depressions.  Unchanged from prior. ____________________________________________  RADIOLOGY  I have personally reviewed the images performed during this visit and I agree with the Radiologist's read.   Interpretation by Radiologist:  Dg Chest Portable 1 View  Result Date: 11/22/2018 CLINICAL DATA:  Altered mental status. Treated for pneumonia 2 weeks ago. EXAM: PORTABLE CHEST 1 VIEW COMPARISON:  Radiographs 10/30/2018 and 10/27/2018. FINDINGS: 1121 hours. Two views submitted. Right arm PICC extends to the superior cavoatrial junction. Left subclavian defibrillator leads appear grossly unchanged within the right atrium, right ventricle and coronary sinus. There is stable mild cardiomegaly and aortic atherosclerosis. Pulmonary edema has improved. There is stable patchy opacity at the right lung base. Veiling opacity in the right perihilar region has increased, suspicious for fluid in the fissure, less likely consolidation in the superior segment of the lower lobe. IMPRESSION: 1. Interval increased right perihilar opacity, probably fluid in the fissure versus consolidation in the superior segment of the lower lobe. 2. Patchy right basilar airspace disease is unchanged. No definite  residual edema. Electronically Signed   By: Richardean Sale M.D.   On: 11/22/2018 12:22      ____________________________________________   PROCEDURES  Procedure(s) performed: None Procedures Critical Care performed: yes  CRITICAL CARE Performed by: Rudene Re  ?  Total critical care time: 40 min  Critical care time was exclusive of separately billable procedures and treating other patients.  Critical care was necessary to treat or prevent imminent or life-threatening deterioration.  Critical care was time spent personally by me on the following activities: development of treatment plan with patient and/or surrogate as well as nursing, discussions with consultants, evaluation of patient's response to treatment, examination of patient, obtaining history from patient or surrogate, ordering and performing treatments and interventions, ordering and review of laboratory studies, ordering and review of radiographic studies, pulse oximetry and re-evaluation of patient's condition.  ____________________________________________   INITIAL IMPRESSION / ASSESSMENT AND PLAN / ED COURSE   64 y.o. female with a history of CAD, CHF with EF of 45 to 50%, AICD, chronic kidney disease, hypertension, OSA on CPAP, diabetes, hypertension, hyperlipidemia who presents for evaluation of altered mental status.  Patient with recent admission for CHF exacerbation, MSSA bacteremia, and pneumonia.  Patient arrives and met sepsis criteria with fever, hypoxia, tachypnea, and hypotension.  Patient is arousable but slow to answer questions.  Looks extremely dry on exam.  EKG shows paced rhythm with no acute ischemic changes.  Labs showing worsening anemia with hemoglobin of 7 (last checked 2 weeks ago at 8.1).  Chemistry panel showing acute  on chronic kidney injury with creatinine of 3.73 (baseline is 1-1.5).  Troponin elevated at 9.30.  Patient continues to denied any chest pain.  Her EKG shows no ischemia.   Lactic is borderline elevated 2.0.  Patient does have a history of CAD and presentation is concerning for NSTEMI in the setting of sepsis. PE also a possibility however unable to obtain CTA because of kidney function. Will order VQ scan. In the meantime will start patient on heparin for NSTEMI vs PE causing elevated troponin. Will type and screen. Will transfuse 1U pRBC. Rectal exam showing green stool guaiac negative. Will start dopamine for shock. Patient given zosyn and vancomycin, IVF, tylenol. Flu pending. Will admit to Hospitalist. Attempted to collect bllod culture from PICC line which was clogged. Has not been used for 2 weeks therefore I removed the PICC line as it is a possible source of infection. IVF given less than recommended per sepsis protocol due to concerns of CHF and demand ischemia.     _________________________ 12:57 PM on 11/22/2018 -----------------------------------------  Discussed with Dr. Rockey Situ who is in agreement with current management. Dr. Jerelyn Charles to admit patient to ICU.   As part of my medical decision making, I reviewed the following data within the [place] notes reviewed and incorporated, Labs reviewed , EKG interpreted , Old EKG reviewed, Old chart reviewed, Radiograph reviewed , Discussed with admitting physician  Notes from prior ED visits and [place] Controlled Substance Database    Pertinent labs & imaging results that were available during my care of the patient were reviewed by me and considered in my medical decision making (see chart for details).    ____________________________________________   FINAL CLINICAL IMPRESSION(S) / ED DIAGNOSES  Final diagnoses:  Sepsis with encephalopathy and septic shock, due to unspecified organism ([place])  Altered mental status, unspecified altered mental status type  Acute kidney injury superimposed on chronic kidney disease ([place])  NSTEMI (non-ST elevated myocardial infarction) ([place])  Acute  respiratory failure with hypoxia ([place])  HCAP (healthcare-associated pneumonia)      NEW MEDICATIONS STARTED DURING THIS VISIT:  ED Discharge Orders    None       Note:  This document was prepared using Dragon voice recognition software and may include unintentional dictation errors.    Alfred Levins, [place], MD 11/22/18 [place], [place], MD 11/22/18 [place], [place], MD 11/22/18 [place], [place], MD 11/22/18 [phone]

## 2018-11-22 NOTE — ED Triage Notes (Signed)
PT to ER via EMS from home with c/o altered mental status.  Pt was treated for pneumonia 2 weeks ago and has completed abx.  Pt was noted on waking by family this AM to not be as alert as normal.  Pt arrives on 4L [place] (wears 2L at night since pneumonia) alert and responsive with one word answers.  Unable to determine orientation.

## 2018-11-22 NOTE — Progress Notes (Signed)
ANTICOAGULATION CONSULT NOTE - Initial Consult  Pharmacy Consult for Heparin Drip Indication: ACS/STEMI  No Known Allergies  Patient Measurements: Height: 5\' 8"  (172.7 cm) Weight: 293 lb (132.9 kg) IBW/kg (Calculated) : 63.9 Heparin Dosing Weight: 95.8 kg  Vital Signs: Temp: 99 F (37.2 C) (02/16 1337) BP: 72/41 (02/16 1400) Pulse Rate: 121 (02/16 1400)  Labs: Recent Labs    11/22/18 1116  HGB 7.0*  HCT 22.4*  PLT 84*  APTT 38*  LABPROT 17.9*  INR 1.50  CREATININE 3.73*  TROPONINI 9.30*    Estimated Creatinine Clearance: 22 mL/min (A) (by C-G formula based on SCr of 3.73 mg/dL (H)).   Medical History: Past Medical History:  Diagnosis Date  . Acute myocardial infarction, subendocardial infarction, initial episode of care ([place]) 07/21/2012  . Acute osteomyelitis involving ankle and foot ([place]) 02/06/2015  . Acute systolic heart failure ([place]) 07/21/2012   New onset 07/19/12; admission to [facility] ED. Elevated Troponins.  S/p 2D-echo with EF 20-25%.  S/p cardiac catheterization with stenting LAD.  Repeat 2D-echo 10/2011 with improved EF of 35%.   . Anemia   . Automatic implantable cardioverter-defibrillator in situ    a. MDT CRT-D 06/2014, SN: [id number]    . CAD (coronary artery disease)    a. cardiac cath 101/04/2012: PCI/DES to chronically occluded mLAD, consideration PCI to diag branch in 4 weeks.   . Cataract   . Chicken pox   . Chronic kidney disease   . Chronic systolic CHF (congestive heart failure) (HCC)    a. mixed ICM & NICM; b. EF 20-25% by echo 07/2012, mid-dist 2/3 of LV sev HK/AK, mild MR. echo 10/2012: EF 30-35%, sev HK ant-septal & inf walls, GR1DD, mild MR, PASP 33. c. echo 02/2013: EF 30%, GR1DD, mild MR. echo 04/2014: EF 30%, Septal-lat dyssynchrony, global HK, inf AK, GR1DD, mild MR. d. echo 10/2014: EF50-55%, WM nl, GR1DD, septal mild paradox. e. echo 02/2015: EF 50-55%, wm   . Depression   . Heart attack ([place])   . Heel ulcer ([place]) 04/27/2015  . History  of blood transfusion ~ 2011   "plasma; had neuropathy; couldn't walk"  . Hypertension   . LBBB (left bundle branch block)   . Neuromuscular disorder ([place])   . Neuropathy 2011  . Obesity, unspecified   . OSA on CPAP    Moderate with AHI 23/hr and now on CPAP at 16cm H2O.  Her DME is [facility]  . Pure hypercholesterolemia   . Type II diabetes mellitus ([place])   . Unspecified vitamin D deficiency     Medications:  Scheduled:  . sodium chloride   Intravenous Once  . acetaminophen  650 mg Oral Once  . diphenhydrAMINE  25 mg Intravenous Once   Infusions:  . DOPamine 7.5 mcg/kg/min (11/22/18 1300)  . heparin 1,250 Units/hr (11/22/18 1244)  . norepinephrine (LEVOPHED) Adult infusion 2 mcg/min (11/22/18 1410)    Assessment: 63 yo F to start Heparin drip for ACS/STEMI/ elevated troponin. recently discharged from the hospital 2 weeks ago for heart failure exacerbation, recently completed antibiotic course for MSSA bacteremia Patient on Plavix at home Hgb 7.0  Plt 84  INR 1.5  APTT 38  Goal of Therapy:  Heparin level 0.3-0.7 units/ml Monitor platelets by anticoagulation protocol: Yes   Plan:  Give 4000 units bolus x 1 Start heparin infusion at 1250 units/hr Check anti-Xa level in 8 hours and daily while on heparin Continue to monitor H&H and platelets  [name] A 11/22/2018,2:17 PM

## 2018-11-22 NOTE — Progress Notes (Addendum)
ANTICOAGULATION CONSULT NOTE - Initial Consult  Pharmacy Consult for Heparin Drip Indication: ACS/STEMI  No Known Allergies  Patient Measurements: Height: 5\' 8"  (172.7 cm) Weight: 283 lb 1.1 oz (128.4 kg) IBW/kg (Calculated) : 63.9 Heparin Dosing Weight: 95.8 kg  Vital Signs: Temp: 98.4 F (36.9 C) (02/16 1745) Temp Source: Oral (02/16 1745) BP: 125/52 (02/16 1800) Pulse Rate: 82 (02/16 1800)  Labs: Recent Labs    11/22/18 1116 11/22/18 1850  HGB 7.0* 9.4*  HCT 22.4* 30.0*  PLT 84*  --   APTT 38*  --   LABPROT 17.9*  --   INR 1.50  --   HEPARINUNFRC  --  <0.10*  CREATININE 3.73*  --   TROPONINI 9.30*  --     Estimated Creatinine Clearance: 21.6 mL/min (A) (by C-G formula based on SCr of 3.73 mg/dL (H)).   Medical History: Past Medical History:  Diagnosis Date  . Acute myocardial infarction, subendocardial infarction, initial episode of care ([place]) 07/21/2012  . Acute osteomyelitis involving ankle and foot ([place]) 02/06/2015  . Acute systolic heart failure ([place]) 07/21/2012   New onset 07/19/12; admission to [facility] ED. Elevated Troponins.  S/p 2D-echo with EF 20-25%.  S/p cardiac catheterization with stenting LAD.  Repeat 2D-echo 10/2011 with improved EF of 35%.   . Anemia   . Automatic implantable cardioverter-defibrillator in situ    a. MDT CRT-D 06/2014, SN: [id number]    . CAD (coronary artery disease)    a. cardiac cath 101/04/2012: PCI/DES to chronically occluded mLAD, consideration PCI to diag branch in 4 weeks.   . Cataract   . Chicken pox   . Chronic kidney disease   . Chronic systolic CHF (congestive heart failure) (HCC)    a. mixed ICM & NICM; b. EF 20-25% by echo 07/2012, mid-dist 2/3 of LV sev HK/AK, mild MR. echo 10/2012: EF 30-35%, sev HK ant-septal & inf walls, GR1DD, mild MR, PASP 33. c. echo 02/2013: EF 30%, GR1DD, mild MR. echo 04/2014: EF 30%, Septal-lat dyssynchrony, global HK, inf AK, GR1DD, mild MR. d. echo 10/2014: EF50-55%, WM nl, GR1DD, septal  mild paradox. e. echo 02/2015: EF 50-55%, wm   . Depression   . Heart attack ([place])   . Heel ulcer ([place]) 04/27/2015  . History of blood transfusion ~ 2011   "plasma; had neuropathy; couldn't walk"  . Hypertension   . LBBB (left bundle branch block)   . Neuromuscular disorder ([place])   . Neuropathy 2011  . Obesity, unspecified   . OSA on CPAP    Moderate with AHI 23/hr and now on CPAP at 16cm H2O.  Her DME is [facility]  . Pure hypercholesterolemia   . Type II diabetes mellitus ([place])   . Unspecified vitamin D deficiency     Medications:  Scheduled:  . acetaminophen  650 mg Oral Once  . [START ON 11/23/2018] aspirin EC  81 mg Oral Daily  . [START ON 11/23/2018] clopidogrel  75 mg Oral Daily  . diphenhydrAMINE  25 mg Intravenous Once  . [START ON 11/23/2018] ferrous sulfate  325 mg Oral Daily  . insulin glargine  16 Units Subcutaneous QHS  . omega-3 acid ethyl esters  1 g Oral BID  . rosuvastatin  40 mg Oral QHS  . sodium chloride flush  10-40 mL Intracatheter Q12H   Infusions:  . sodium chloride 60 mL/hr at 11/22/18 1938  . [START ON 11/23/2018]  ceFAZolin (ANCEF) IV    . DOPamine Stopped (11/22/18 1438)  . famotidine (  PEPCID) IV    . norepinephrine (LEVOPHED) Adult infusion 14 mcg/min (11/22/18 2016)    Assessment: 63 yo F to start Heparin drip for ACS/STEMI/ elevated troponin. recently discharged from the hospital 2 weeks ago for heart failure exacerbation, recently completed antibiotic course for MSSA bacteremia Patient on Plavix at home Hgb 7.0  Plt 84  INR 1.5  APTT 38  2/16 1850 HL <0.10  Goal of Therapy:  Heparin level 0.3-0.7 units/ml Monitor platelets by anticoagulation protocol: Yes   Plan:  Heparin discontinued.   Paulina Fusi, PharmD, BCPS 11/22/2018 9:11 PM

## 2018-11-22 NOTE — ED Notes (Signed)
Date and time results received: 11/22/18 12:07 PM  Test: Troponin 9.30/Lactic 2.0 Critical Value: Troponin 9.30/Lactic 2.0  Name of Provider Notified: Dr. Alfred Levins  Orders Received? Or Actions Taken?: Orders Received - See Orders for details

## 2018-11-22 NOTE — ED Notes (Signed)
Discussed further fluid resuscitation with Dr. Alfred Levins, she declines additional fluid at this time.

## 2018-11-23 ENCOUNTER — Inpatient Hospital Stay (HOSPITAL_COMMUNITY)
Admit: 2018-11-23 | Discharge: 2018-11-23 | Disposition: A | Discharge status: Home / Self Care | Payer: PPO | Attending: Infectious Disease | Admitting: Infectious Disease

## 2018-11-23 ENCOUNTER — Other Ambulatory Visit: Payer: Self-pay | Admitting: *Deleted

## 2018-11-23 DIAGNOSIS — Z8619 Personal history of other infectious and parasitic diseases: Secondary | ICD-10-CM

## 2018-11-23 DIAGNOSIS — Z8739 Personal history of other diseases of the musculoskeletal system and connective tissue: Secondary | ICD-10-CM

## 2018-11-23 DIAGNOSIS — E119 Type 2 diabetes mellitus without complications: Secondary | ICD-10-CM

## 2018-11-23 DIAGNOSIS — I509 Heart failure, unspecified: Secondary | ICD-10-CM

## 2018-11-23 DIAGNOSIS — I429 Cardiomyopathy, unspecified: Secondary | ICD-10-CM

## 2018-11-23 DIAGNOSIS — Z95810 Presence of automatic (implantable) cardiac defibrillator: Secondary | ICD-10-CM

## 2018-11-23 DIAGNOSIS — A4101 Sepsis due to Methicillin susceptible Staphylococcus aureus: Secondary | ICD-10-CM

## 2018-11-23 DIAGNOSIS — I340 Nonrheumatic mitral (valve) insufficiency: Secondary | ICD-10-CM

## 2018-11-23 DIAGNOSIS — Z955 Presence of coronary angioplasty implant and graft: Secondary | ICD-10-CM

## 2018-11-23 DIAGNOSIS — I5022 Chronic systolic (congestive) heart failure: Secondary | ICD-10-CM

## 2018-11-23 DIAGNOSIS — D696 Thrombocytopenia, unspecified: Secondary | ICD-10-CM

## 2018-11-23 DIAGNOSIS — I2510 Atherosclerotic heart disease of native coronary artery without angina pectoris: Secondary | ICD-10-CM

## 2018-11-23 DIAGNOSIS — Z89422 Acquired absence of other left toe(s): Secondary | ICD-10-CM

## 2018-11-23 DIAGNOSIS — R0902 Hypoxemia: Secondary | ICD-10-CM

## 2018-11-23 LAB — BPAM RBC
BLOOD PRODUCT EXPIRATION DATE: 202003062359
Blood Product Expiration Date: 202003062359
ISSUE DATE / TIME: 202002060858
ISSUE DATE / TIME: 202002161433
Unit Type and Rh: 5100
Unit Type and Rh: 5100

## 2018-11-23 LAB — CBC
HCT: 29.4 % — ABNORMAL LOW (ref 36.0–46.0)
Hemoglobin: 9.1 g/dL — ABNORMAL LOW (ref 12.0–15.0)
MCH: 27.5 pg (ref 26.0–34.0)
MCHC: 31 g/dL (ref 30.0–36.0)
MCV: 88.8 fL (ref 80.0–100.0)
Platelets: 101 10*3/uL — ABNORMAL LOW (ref 150–400)
RBC: 3.31 MIL/uL — ABNORMAL LOW (ref 3.87–5.11)
RDW: 15.8 % — ABNORMAL HIGH (ref 11.5–15.5)
WBC: 7.4 10*3/uL (ref 4.0–10.5)
nRBC: 0 % (ref 0.0–0.2)

## 2018-11-23 LAB — COMPREHENSIVE METABOLIC PANEL
ALT: 28 U/L (ref 0–44)
AST: 105 U/L — ABNORMAL HIGH (ref 15–41)
Albumin: 2.5 g/dL — ABNORMAL LOW (ref 3.5–5.0)
Alkaline Phosphatase: 44 U/L (ref 38–126)
Anion gap: 12 (ref 5–15)
BUN: 65 mg/dL — ABNORMAL HIGH (ref 8–23)
CALCIUM: 7.2 mg/dL — AB (ref 8.9–10.3)
CO2: 24 mmol/L (ref 22–32)
Chloride: 99 mmol/L (ref 98–111)
Creatinine, Ser: 3.96 mg/dL — ABNORMAL HIGH (ref 0.44–1.00)
GFR, EST AFRICAN AMERICAN: 13 mL/min — AB (ref >60)
GFR, EST NON AFRICAN AMERICAN: 11 mL/min — AB (ref >60)
Glucose, Bld: 284 mg/dL — ABNORMAL HIGH (ref 70–99)
Potassium: 3.8 mmol/L (ref 3.5–5.1)
Sodium: 135 mmol/L (ref 135–145)
Total Bilirubin: 0.7 mg/dL (ref 0.3–1.2)
Total Protein: 5.9 g/dL — ABNORMAL LOW (ref 6.5–8.1)

## 2018-11-23 LAB — TYPE AND SCREEN
ABO/RH(D): O POS
Antibody Screen: NEGATIVE
Unit division: 0
Unit division: 0

## 2018-11-23 LAB — GLUCOSE, CAPILLARY
GLUCOSE-CAPILLARY: 232 mg/dL — AB (ref 70–99)
GLUCOSE-CAPILLARY: 210 mg/dL — AB (ref 70–99)
Glucose-Capillary: 224 mg/dL — ABNORMAL HIGH (ref 70–99)
Glucose-Capillary: 205 mg/dL — ABNORMAL HIGH (ref 70–99)
Glucose-Capillary: 178 mg/dL — ABNORMAL HIGH (ref 70–99)

## 2018-11-23 LAB — PREPARE RBC (CROSSMATCH)

## 2018-11-23 LAB — MAGNESIUM: Magnesium: 1.3 mg/dL — ABNORMAL LOW (ref 1.7–2.4)

## 2018-11-23 LAB — PHOSPHORUS: Phosphorus: 4.4 mg/dL (ref 2.5–4.6)

## 2018-11-23 LAB — VITAMIN B12: Vitamin B-12: 415 pg/mL (ref 180–914)

## 2018-11-23 LAB — ECHOCARDIOGRAM COMPLETE
Height: 68 in
Weight: 4529.13 oz

## 2018-11-23 LAB — CORTISOL: Cortisol, Plasma: 38.5 ug/dL

## 2018-11-23 MED ORDER — VANCOMYCIN HCL 10 G IV SOLR
2000.0000 mg | Freq: Once | INTRAVENOUS | Status: DC
  Filled 2018-11-23: qty 2000

## 2018-11-23 MED ORDER — INSULIN ASPART 100 UNIT/ML ~~LOC~~ SOLN
0.0000 [IU] | SUBCUTANEOUS | Status: DC
  Administered 2018-11-23 (×2): 7 [IU] via SUBCUTANEOUS
  Filled 2018-11-23: qty 1

## 2018-11-23 MED ORDER — SODIUM CHLORIDE 0.9 % IV BOLUS
1000.0000 mL | INTRAVENOUS | Status: AC
  Administered 2018-11-23 (×2): 1000 mL via INTRAVENOUS

## 2018-11-23 MED ORDER — SODIUM CHLORIDE 0.9 % IV SOLN
2.0000 g | INTRAVENOUS | Status: DC
  Administered 2018-11-23 – 2018-11-28 (×32): 2 g via INTRAVENOUS
  Filled 2018-11-23 (×2): qty 2000
  Filled 2018-11-23 (×2): qty 2
  Filled 2018-11-23: qty 2000
  Filled 2018-11-23 (×4): qty 2
  Filled 2018-11-23: qty 2000
  Filled 2018-11-23 (×2): qty 2
  Filled 2018-11-23: qty 2000
  Filled 2018-11-23: qty 2
  Filled 2018-11-23 (×2): qty 2000
  Filled 2018-11-23: qty 2
  Filled 2018-11-23: qty 2000
  Filled 2018-11-23 (×4): qty 2
  Filled 2018-11-23: qty 2000
  Filled 2018-11-23 (×2): qty 2
  Filled 2018-11-23: qty 2000
  Filled 2018-11-23 (×3): qty 2
  Filled 2018-11-23: qty 2000
  Filled 2018-11-23 (×3): qty 2
  Filled 2018-11-23: qty 2000
  Filled 2018-11-23 (×2): qty 2
  Filled 2018-11-23: qty 2000

## 2018-11-23 MED ORDER — NOREPINEPHRINE 16 MG/250ML-% IV SOLN
0.0000 ug/min | INTRAVENOUS | Status: DC
  Administered 2018-11-23: 10 ug/min via INTRAVENOUS
  Filled 2018-11-23 (×3): qty 250

## 2018-11-23 MED ORDER — HEPARIN SODIUM (PORCINE) 5000 UNIT/ML IJ SOLN
5000.0000 [IU] | Freq: Three times a day (TID) | INTRAMUSCULAR | Status: DC
  Administered 2018-11-23 – 2018-11-28 (×17): 5000 [IU] via SUBCUTANEOUS
  Filled 2018-11-23 (×17): qty 1

## 2018-11-23 MED ORDER — INSULIN ASPART 100 UNIT/ML ~~LOC~~ SOLN
0.0000 [IU] | Freq: Three times a day (TID) | SUBCUTANEOUS | Status: DC
  Administered 2018-11-23: 3 [IU] via SUBCUTANEOUS
  Administered 2018-11-24 (×2): 5 [IU] via SUBCUTANEOUS
  Administered 2018-11-24 – 2018-11-26 (×3): 3 [IU] via SUBCUTANEOUS
  Administered 2018-11-26 (×2): 2 [IU] via SUBCUTANEOUS
  Administered 2018-11-27 (×2): 3 [IU] via SUBCUTANEOUS
  Administered 2018-11-27: 5 [IU] via SUBCUTANEOUS
  Administered 2018-11-28: 3 [IU] via SUBCUTANEOUS
  Administered 2018-11-28: 5 [IU] via SUBCUTANEOUS
  Administered 2018-11-28: 3 [IU] via SUBCUTANEOUS
  Filled 2018-11-23 (×15): qty 1

## 2018-11-23 MED ORDER — LACTATED RINGERS IV SOLN: INTRAVENOUS | Status: DC

## 2018-11-23 MED ORDER — ORAL CARE MOUTH RINSE
15.0000 mL | Freq: Two times a day (BID) | OROMUCOSAL | Status: DC
  Administered 2018-11-23 – 2018-11-28 (×6): 15 mL via OROMUCOSAL

## 2018-11-23 MED ORDER — INSULIN ASPART 100 UNIT/ML ~~LOC~~ SOLN
0.0000 [IU] | Freq: Every day | SUBCUTANEOUS | Status: DC
  Administered 2018-11-23 – 2018-11-26 (×2): 2 [IU] via SUBCUTANEOUS
  Administered 2018-11-28: 3 [IU] via SUBCUTANEOUS
  Filled 2018-11-23 (×3): qty 1

## 2018-11-23 NOTE — Progress Notes (Signed)
[place], [place] 11/23/18  Subjective:   Patient is critically ill.  Serum creatinine has further worsened to 3.96 Urine output recorded at 250 cc so far   Objective:  Vital signs in last 24 hours:  Temp:  [97.2 F (36.2 C)-100 F (37.8 C)] 98.4 F (36.9 C) (02/17 0400) Pulse Rate:  [70-134] 75 (02/17 0700) Resp:  [0-23] 20 (02/17 0700) BP: (67-133)/(31-70) 121/48 (02/17 0700) SpO2:  [89 %-100 %] 94 % (02/17 0700) Weight:  [128.4 kg] 128.4 kg (02/16 1552)  Weight change:  Filed Weights   11/22/18 1112 11/22/18 1552  Weight: 132.9 kg 128.4 kg    Intake/Output:    Intake/Output Summary (Last 24 hours) at 11/23/2018 1130 Last data filed at 11/23/2018 0600 Gross per 24 hour  Intake 4219.34 ml  Output 175 ml  Net 4044.34 ml     Physical Exam: General:  Critically ill-appearing, laying in the bed  HEENT  dry oral mucous membranes  Neck  supple  Pulm/lungs  coarse breath sounds bilaterally  CVS/Heart  paced rhythm  Abdomen:   Soft, nontender  Extremities:  Trace edema  Neurologic:  Alert, able to answer questions  Foley in place  Basic Metabolic Panel:  Recent Labs  Lab 11/22/18 1116 11/23/18 0526  NA 132* 135  K 4.1 3.8  CL 93* 99  CO2 27 24  GLUCOSE 251* 284*  BUN 58* 65*  CREATININE 3.73* 3.96*  CALCIUM 7.5* 7.2*  MG  --  1.3*  PHOS  --  4.4     CBC: Recent Labs  Lab 11/22/18 1116 11/22/18 1850 11/23/18 0526  WBC 4.8  --  7.4  NEUTROABS 4.1  --   --   HGB 7.0* 9.4* 9.1*  HCT 22.4* 30.0* 29.4*  MCV 91.8  --  88.8  PLT 84*  --  101*     No results found for: HEPBSAG, HEPBSAB, HEPBIGM    Microbiology:  Recent Results (from the past 240 hour(s))  Blood culture (routine x 2)     Status: Abnormal (Preliminary result)   Collection Time: 11/22/18 11:16 AM  Result Value Ref Range Status   Specimen Description   Final    BLOOD BLOOD LEFT HAND Performed at [facility], [address]  [place], [place] [address]    Special Requests   Final    BOTTLES DRAWN AEROBIC AND ANAEROBIC Blood Culture adequate volume Performed at [facility], [address] [place], [place] [address]    Culture  Setup Time   Final    GRAM POSITIVE COCCI IN BOTH AEROBIC AND ANAEROBIC BOTTLES CRITICAL RESULT CALLED TO, READ BACK BY AND VERIFIED WITH: LISA KLUTTZ 11/22/18 @ 9702  [place]    Culture (A)  Final    STAPHYLOCOCCUS AUREUS SUSCEPTIBILITIES TO FOLLOW Performed at [facility], [address], [place], [place] [address]    Report Status PENDING  Incomplete  Blood Culture ID Panel (Reflexed)     Status: Abnormal   Collection Time: 11/22/18 11:16 AM  Result Value Ref Range Status   Enterococcus species NOT DETECTED NOT DETECTED Final   Listeria monocytogenes NOT DETECTED NOT DETECTED Final   Staphylococcus species DETECTED (A) NOT DETECTED Final    Comment: CRITICAL RESULT CALLED TO, READ BACK BY AND VERIFIED WITH: LISA KLUTTZ 11/22/18 @ 1837  MLK    Staphylococcus aureus (BCID) DETECTED (A) NOT DETECTED Final    Comment: Methicillin (oxacillin) susceptible Staphylococcus aureus (MSSA). Preferred therapy is  anti staphylococcal beta lactam antibiotic (Cefazolin or Nafcillin), unless clinically contraindicated. CRITICAL RESULT CALLED TO, READ BACK BY AND VERIFIED WITH: LISA KLUTTZ 11/22/18 @ 1837  MLK    Methicillin resistance NOT DETECTED NOT DETECTED Final   Streptococcus species NOT DETECTED NOT DETECTED Final   Streptococcus agalactiae NOT DETECTED NOT DETECTED Final   Streptococcus pneumoniae NOT DETECTED NOT DETECTED Final   Streptococcus pyogenes NOT DETECTED NOT DETECTED Final   Acinetobacter baumannii NOT DETECTED NOT DETECTED Final   Enterobacteriaceae species NOT DETECTED NOT DETECTED Final   Enterobacter cloacae complex NOT DETECTED NOT DETECTED Final   Escherichia coli NOT DETECTED NOT DETECTED Final   Klebsiella oxytoca NOT DETECTED NOT DETECTED Final    Klebsiella pneumoniae NOT DETECTED NOT DETECTED Final   Proteus species NOT DETECTED NOT DETECTED Final   Serratia marcescens NOT DETECTED NOT DETECTED Final   Haemophilus influenzae NOT DETECTED NOT DETECTED Final   Neisseria meningitidis NOT DETECTED NOT DETECTED Final   Pseudomonas aeruginosa NOT DETECTED NOT DETECTED Final   Candida albicans NOT DETECTED NOT DETECTED Final   Candida glabrata NOT DETECTED NOT DETECTED Final   Candida krusei NOT DETECTED NOT DETECTED Final   Candida parapsilosis NOT DETECTED NOT DETECTED Final   Candida tropicalis NOT DETECTED NOT DETECTED Final    Comment: Performed at [facility], [address] [place], [place] [address]  MRSA PCR Screening     Status: None   Collection Time: 11/22/18  3:44 PM  Result Value Ref Range Status   MRSA by PCR NEGATIVE NEGATIVE Final    Comment:        The GeneXpert MRSA Assay (FDA approved for NASAL specimens only), is one component of a comprehensive MRSA colonization surveillance program. It is not intended to diagnose MRSA infection nor to guide or monitor treatment for MRSA infections. Performed at [facility], [address] [place], [place] [address]   Blood culture (routine x 2)     Status: None (Preliminary result)   Collection Time: 11/22/18  6:31 PM  Result Value Ref Range Status   Specimen Description   Final    BLOOD RIGHT HAND Performed at [facility], [place] [address] [place], [place] [address]    Special Requests   Final    BOTTLES DRAWN AEROBIC ONLY Blood Culture results may not be optimal due to an inadequate volume of blood received in culture bottles   Culture  Setup Time   Final    GRAM POSITIVE COCCI AEROBIC BOTTLE ONLY CRITICAL RESULT CALLED TO, READ BACK BY AND VERIFIED WITH: CHRISTINE KATSOUDAS AT 1443 11/23/18 [place] Performed at [facility], [address] [place], [place] [address]    Culture GRAM POSITIVE COCCI  Final   Report Status  PENDING  Incomplete    Coagulation Studies: Recent Labs    11/22/18 1116  LABPROT 17.9*  INR 1.50    Urinalysis: No results for input(s): COLORURINE, LABSPEC, PHURINE, GLUCOSEU, HGBUR, BILIRUBINUR, KETONESUR, PROTEINUR, UROBILINOGEN, NITRITE, LEUKOCYTESUR in the last 72 hours.  Invalid input(s): APPERANCEUR    Imaging: Dg Chest Port 1 View  Result Date: 11/22/2018 CLINICAL DATA:  Central line placement EXAM: PORTABLE CHEST 1 VIEW COMPARISON:  November 22, 2018 FINDINGS: The new right central line terminates near the caval atrial junction. No pneumothorax. Right perihilar opacity is a little less conspicuous but remains. Mild increased interstitial opacities suggest pulmonary venous congestion. The cardiomediastinal silhouette is stable. No other change. IMPRESSION: 1. The new right central  line terminates near the caval atrial junction in good position with no pneumothorax. 2. Probable pulmonary venous congestion/mild edema. 3. Persistent but slightly less prominent right perihilar opacity. Recommend follow-up to resolution. Electronically Signed   By: Dorise Bullion III M.D   On: 11/22/2018 18:38   Dg Chest Portable 1 View  Result Date: 11/22/2018 CLINICAL DATA:  Altered mental status. Treated for pneumonia 2 weeks ago. EXAM: PORTABLE CHEST 1 VIEW COMPARISON:  Radiographs 10/30/2018 and 10/27/2018. FINDINGS: 1121 hours. Two views submitted. Right arm PICC extends to the superior cavoatrial junction. Left subclavian defibrillator leads appear grossly unchanged within the right atrium, right ventricle and coronary sinus. There is stable mild cardiomegaly and aortic atherosclerosis. Pulmonary edema has improved. There is stable patchy opacity at the right lung base. Veiling opacity in the right perihilar region has increased, suspicious for fluid in the fissure, less likely consolidation in the superior segment of the lower lobe. IMPRESSION: 1. Interval increased right perihilar opacity,  probably fluid in the fissure versus consolidation in the superior segment of the lower lobe. 2. Patchy right basilar airspace disease is unchanged. No definite residual edema. Electronically Signed   By: Richardean Sale M.D.   On: 11/22/2018 12:22     Medications:   . sodium chloride 60 mL/hr at 11/23/18 0600  . DOPamine Stopped (11/22/18 1438)  . norepinephrine (LEVOPHED) Adult infusion 8 mcg/min (11/23/18 0746)   . acetaminophen  650 mg Oral Once  . aspirin EC  81 mg Oral Daily  . clopidogrel  75 mg Oral Daily  . diphenhydrAMINE  25 mg Intravenous Once  . ferrous sulfate  325 mg Oral Daily  . heparin injection (subcutaneous)  5,000 Units Subcutaneous Q8H  . insulin aspart  0-20 Units Subcutaneous Q4H  . insulin glargine  16 Units Subcutaneous QHS  . mouth rinse  15 mL Mouth Rinse BID  . omega-3 acid ethyl esters  1 g Oral BID  . rosuvastatin  40 mg Oral QHS  . sodium chloride flush  10-40 mL Intracatheter Q12H  . vancomycin variable dose per unstable renal function (pharmacist dosing)   Does not apply See admin instructions   acetaminophen, albuterol, nitroGLYCERIN, ondansetron (ZOFRAN) IV, sodium chloride flush  Assessment/ Plan:  63 y.o. female insulin dependent diabetes mellitus type 2, diabetic neuropathy, hypertension, anemia, obstructive sleep apnea requiring CPAP, positive ANA, hyperlipidemia, systolic congestive heart failure status post AICD placement, coronary artery disease status post stent is admitted for sepsis  1.  Acute kidney injury Likely secondary to ATN from sepsis Currently has MSSA bacteremia from cultures drawn on February 16. Serum creatinine is critically elevated and worsening.  Urine output is fair No acute indication for dialysis at present but we will continue to monitor closely ID team is recommending possible removal of AICD Will follow closely   LOS: [place] 2/17/202011:30 AM  [place],  [place]  Note: This note was prepared with Dragon dictation. Any transcription errors are unintentional

## 2018-11-23 NOTE — Progress Notes (Signed)
Progress Note  Patient Name: [name] Date of Encounter: 11/23/2018  Primary Cardiologist: Dr. Fletcher Anon  Subjective   No chest pain or significant dyspnea.  Blood culture came back positive for MSSA bacteremia.  She had this in December was negative TTE and TEE.  Osteomyelitis seems to have healed since then.  She continues to require norepinephrine drip and renal function has not improved.  Inpatient Medications    Scheduled Meds: . aspirin EC  81 mg Oral Daily  . clopidogrel  75 mg Oral Daily  . ferrous sulfate  325 mg Oral Daily  . heparin injection (subcutaneous)  5,000 Units Subcutaneous Q8H  . insulin aspart  0-15 Units Subcutaneous TID WC  . insulin aspart  0-5 Units Subcutaneous QHS  . insulin glargine  16 Units Subcutaneous QHS  . mouth rinse  15 mL Mouth Rinse BID  . omega-3 acid ethyl esters  1 g Oral BID  . rosuvastatin  40 mg Oral QHS  . sodium chloride flush  10-40 mL Intracatheter Q12H   Continuous Infusions: . nafcillin IV    . norepinephrine (LEVOPHED) Adult infusion     PRN Meds: acetaminophen, albuterol, nitroGLYCERIN, ondansetron (ZOFRAN) IV, sodium chloride flush   Vital Signs    Vitals:   11/23/18 0400 11/23/18 0500 11/23/18 0600 11/23/18 0700  BP: 117/70 (!) 120/53 (!) 130/51 (!) 121/48  Pulse: 79 76 79 75  Resp: 20 12 18 20   Temp: 98.4 F (36.9 C)     TempSrc: Oral     SpO2: 100% 95% 96% 94%  Weight:      Height:        Intake/Output Summary (Last 24 hours) at 11/23/2018 1524 Last data filed at 11/23/2018 0600 Gross per 24 hour  Intake 2019.34 ml  Output 175 ml  Net 1844.34 ml   Last 3 Weights 11/22/2018 11/22/2018 11/11/2018  Weight (lbs) 283 lb 1.1 oz 293 lb 292 lb 8 oz  Weight (kg) 128.4 kg 132.904 kg 132.677 kg  Some encounter information is confidential and restricted. Go to Review Flowsheets activity to see all data.      Telemetry    Sinus with paced rhythm- Personally Reviewed  ECG    Not performed today- Personally  Reviewed  Physical Exam   GEN: No acute distress.   Neck: No JVD Cardiac: RRR, no  rubs, or gallops.  2 out of 6 systolic murmur at the base Respiratory: Clear to auscultation bilaterally. GI: Soft, nontender, non-distended  MS: No edema; No deformity. Neuro:  Nonfocal  Psych: Normal affect   Labs    Chemistry Recent Labs  Lab 11/22/18 1116 11/23/18 0526  NA 132* 135  K 4.1 3.8  CL 93* 99  CO2 27 24  GLUCOSE 251* 284*  BUN 58* 65*  CREATININE 3.73* 3.96*  CALCIUM 7.5* 7.2*  PROT 6.6 5.9*  ALBUMIN 2.9* 2.5*  AST 197* 105*  ALT 33 28  ALKPHOS 43 44  BILITOT 0.8 0.7  GFRNONAA 12* 11*  GFRAA 14* 13*  ANIONGAP 12 12     Hematology Recent Labs  Lab 11/22/18 1116 11/22/18 1850 11/23/18 0526  WBC 4.8  --  7.4  RBC 2.44* 3.36* 3.31*  HGB 7.0* 9.4* 9.1*  HCT 22.4* 30.0* 29.4*  MCV 91.8  --  88.8  MCH 28.7  --  27.5  MCHC 31.3  --  31.0  RDW 13.9  --  15.8*  PLT 84*  --  101*    Cardiac Enzymes  Recent Labs  Lab 11/22/18 1116  TROPONINI 9.30*   No results for input(s): TROPIPOC in the last 168 hours.   BNP Recent Labs  Lab 11/22/18 1116  BNP 2,246.0*     DDimer No results for input(s): DDIMER in the last 168 hours.   Radiology    Dg Chest Port 1 View  Result Date: 11/22/2018 CLINICAL DATA:  Central line placement EXAM: PORTABLE CHEST 1 VIEW COMPARISON:  November 22, 2018 FINDINGS: The new right central line terminates near the caval atrial junction. No pneumothorax. Right perihilar opacity is a little less conspicuous but remains. Mild increased interstitial opacities suggest pulmonary venous congestion. The cardiomediastinal silhouette is stable. No other change. IMPRESSION: 1. The new right central line terminates near the caval atrial junction in good position with no pneumothorax. 2. Probable pulmonary venous congestion/mild edema. 3. Persistent but slightly less prominent right perihilar opacity. Recommend follow-up to resolution. Electronically  Signed   By: Dorise Bullion III M.D   On: 11/22/2018 18:38   Dg Chest Portable 1 View  Result Date: 11/22/2018 CLINICAL DATA:  Altered mental status. Treated for pneumonia 2 weeks ago. EXAM: PORTABLE CHEST 1 VIEW COMPARISON:  Radiographs 10/30/2018 and 10/27/2018. FINDINGS: 1121 hours. Two views submitted. Right arm PICC extends to the superior cavoatrial junction. Left subclavian defibrillator leads appear grossly unchanged within the right atrium, right ventricle and coronary sinus. There is stable mild cardiomegaly and aortic atherosclerosis. Pulmonary edema has improved. There is stable patchy opacity at the right lung base. Veiling opacity in the right perihilar region has increased, suspicious for fluid in the fissure, less likely consolidation in the superior segment of the lower lobe. IMPRESSION: 1. Interval increased right perihilar opacity, probably fluid in the fissure versus consolidation in the superior segment of the lower lobe. 2. Patchy right basilar airspace disease is unchanged. No definite residual edema. Electronically Signed   By: Richardean Sale M.D.   On: 11/22/2018 12:22    Cardiac Studies   LHC 10/05/18  Prox Cx to Dist Cx lesion is 80% stenosed.  Prox LAD-1 lesion is 40% stenosed.  Prox LAD-2 lesion is 90% stenosed.  Prox LAD to Mid LAD lesion is 80% stenosed.  Mid LAD to Dist LAD lesion is 70% stenosed.  1st Diag lesion is 90% stenosed.  Ost 1st Diag lesion is 80% stenosed.  Mid RCA-1 lesion is 40% stenosed.  Mid RCA-2 lesion is 40% stenosed. Final Conclusions:  Severe disease of LAD and diagonal branch Patent proximal LAD stent with severe stenosis at the distal edge of the stent Remainder of the LAD is also small, diffusely diseased, diagonal branch diffusely diseased -RCA large [name] dominant with mild disease, left circumflex/OM with mild disease proximal region  Recommendations:  Case discussed with interventional cardiology, Placing a short stent  in the distal edge of the LAD stent may provide little clinical benefit with high risk and may delay surgery on her osteomyelitis/toe infection. Troponin elevation likely secondary to demand ischemia in the setting of febrile, tachycardic, hypertensive on arrival in the setting of bacteremia --Would favor medical management at this time and proceeding with removal of her toe.   TEE 10/02/18 - Left ventricle: Systolic function was mildly reduced. The estimated ejection fraction was in the range of 45% to 50%. - Aortic valve: No evidence of vegetation. There was trivial regurgitation. - Mitral valve: There was mild regurgitation. - Left atrium: No evidence of thrombus in the atrial cavity or appendage. - Right atrium: No evidence of  thrombus in the atrial cavity or appendage. - Atrial septum: Echo contrast study showed no right-to-left atrial level shunt, at baseline or with provocation. - Pulmonic valve: No evidence of vegetation.  Impressions: - No evidence of valve vegetation or wire associated vegetation.  TTE 09/29/18 - Left ventricle: The cavity size was normal. Systolic function was mildly reduced. The estimated ejection fraction was in the range of 45% to 50%. The study is not technically sufficient to allow evaluation of LV diastolic function. - Left atrium: The atrium was mildly dilated.  Impressions: - Very suboptimal study. No evidence of vegetations. Consider TEE.  Patient Profile     63 y.o. female with history of coronary artery disease, chronic systolic heart failure status post CRT-D in 2015 with improvement in ejection fraction to 45%, type 2 diabetes, hypertension, hyperlipidemia and sleep apnea who presented with worsening fatigue and was found to be hypotensive with elevated troponin  Assessment & Plan    1.  Non-ST elevation myocardial infarction: Troponin around 10.  I suspect that this is likely due to supply demand ischemia.  The  patient did have cardiac catheterization 2 months ago and the results are outlined above.  Given her extensive medical issues, I do not recommend further ischemic evaluation.  Continue medical therapy with dual antiplatelet therapy and rosuvastatin.  2.  Recurrent MSSA bacteremia: It appears that her osteomyelitis has healed.  She did have both of TTE and TEE done in December with no clear evidence of vegetation but I agree with infectious diseases that we should strongly consider removing her ICD.  I will consult Dr. Caryl Comes for this.  3.  Acute renal failure: Likely due to volume depletion and possible ATN due to hypotension.  Continue supportive care.  3.  Chronic systolic heart failure: EF appears to be around 44% on echo done today.      For questions or updates, please contact [facility] Please consult [email or web address] for contact info under        Signed, [name], MD  11/23/2018, 3:24 PM

## 2018-11-23 NOTE — Progress Notes (Signed)
Please note patient is currently followed by outpatient Palliative at home. CMRN Doran Clay made aware. Flo Shanks BSN, RN, [facility] [place], hospital Liaison [phone]

## 2018-11-23 NOTE — Plan of Care (Signed)
Pt cont on levo gtt, VSS, CVP 8-10, decreased Urine output, NP notified stated will consult nephrology do to pt >creatine level  and BUN.

## 2018-11-23 NOTE — Progress Notes (Signed)
[place] [facility] [place] NAME: [name]    MR#:  [id number]  DATE OF BIRTH:  1955/11/07  SUBJECTIVE: Admitted for acute non-ST elevation MI, sepsis, started on aggressive hydration, IV antibiotics, pressors, seen in intensive care unit.  Poor urine output yesterday and last night, noted to have worsening renal function.  Patient not been eating, recently.  CHIEF COMPLAINT:   Chief Complaint  Patient presents with  . Altered Mental Status    REVIEW OF SYSTEMS:    Review of Systems  Unable to perform ROS: Mental acuity    Nutrition:  Tolerating Diet: Tolerating PT:      DRUG ALLERGIES:  No Known Allergies  VITALS:  Blood pressure (!) 121/48, pulse 75, temperature 98.4 F (36.9 C), temperature source Oral, resp. rate 20, height 5\' 8"  (1.727 m), weight 128.4 kg, SpO2 94 %.  PHYSICAL EXAMINATION:   Physical Exam  GENERAL:  63 y.o.-year-old patient lying in the bed with no acute distress.  Appears dehydrated EYES: Pupils equal, round, reactive to light . No scleral icterus. Extraocular muscles intact.  HEENT: Head atraumatic, normocephalic. Oropharynx and nasopharynx clear.  Patient mucosa is dry. NECK:  Supple, no jugular venous distention. No thyroid enlargement, no tenderness.  LUNGS: Diminished air entry bilaterally.  CARDIOVASCULAR: S1 S1, S2 regular, patient has systolic murmur grade 2/6, diastolic murmur with a grade 2/6 present   aBDOMEN: Soft, nontender, nondistended. Bowel sounds present. No organomegaly or mass.  EXTREMITIES: No pedal edema, cyanosis, or clubbing.  NEUROLOGIC:  lethargic, unable to follow commands for full neuro exam this morning. PSYCHIATRIC: The patient is alert and oriented x 3.  SKIN: No obvious rash, lesion, or ulcer.    LABORATORY PANEL:   CBC Recent Labs  Lab 11/23/18 0526  WBC 7.4  HGB 9.1*  HCT 29.4*  PLT 101*    ------------------------------------------------------------------------------------------------------------------  Chemistries  Recent Labs  Lab 11/23/18 0526  NA 135  K 3.8  CL 99  CO2 24  GLUCOSE 284*  BUN 65*  CREATININE 3.96*  CALCIUM 7.2*  MG 1.3*  AST 105*  ALT 28  ALKPHOS 44  BILITOT 0.7   ------------------------------------------------------------------------------------------------------------------  Cardiac Enzymes Recent Labs  Lab 11/22/18 1116  TROPONINI 9.30*   ------------------------------------------------------------------------------------------------------------------  RADIOLOGY:  Dg Chest Port 1 View  Result Date: 11/22/2018 CLINICAL DATA:  Central line placement EXAM: PORTABLE CHEST 1 VIEW COMPARISON:  November 22, 2018 FINDINGS: The new right central line terminates near the caval atrial junction. No pneumothorax. Right perihilar opacity is a little less conspicuous but remains. Mild increased interstitial opacities suggest pulmonary venous congestion. The cardiomediastinal silhouette is stable. No other change. IMPRESSION: 1. The new right central line terminates near the caval atrial junction in good position with no pneumothorax. 2. Probable pulmonary venous congestion/mild edema. 3. Persistent but slightly less prominent right perihilar opacity. Recommend follow-up to resolution. Electronically Signed   By: Dorise Bullion III M.D   On: 11/22/2018 18:38   Dg Chest Portable 1 View  Result Date: 11/22/2018 CLINICAL DATA:  Altered mental status. Treated for pneumonia 2 weeks ago. EXAM: PORTABLE CHEST 1 VIEW COMPARISON:  Radiographs 10/30/2018 and 10/27/2018. FINDINGS: 1121 hours. Two views submitted. Right arm PICC extends to the superior cavoatrial junction. Left subclavian defibrillator leads appear grossly unchanged within the right atrium, right ventricle and coronary sinus. There is stable mild cardiomegaly and aortic atherosclerosis. Pulmonary  edema has improved. There is stable patchy opacity at the right lung base.  Veiling opacity in the right perihilar region has increased, suspicious for fluid in the fissure, less likely consolidation in the superior segment of the lower lobe. IMPRESSION: 1. Interval increased right perihilar opacity, probably fluid in the fissure versus consolidation in the superior segment of the lower lobe. 2. Patchy right basilar airspace disease is unchanged. No definite residual edema. Electronically Signed   By: Richardean Sale M.D.   On: 11/22/2018 12:22     ASSESSMENT AND PLAN:   Active Problems:   NSTEMI (non-ST elevated myocardial infarction) ([place])   #1 hypovolemic shock secondary to not eating and drinking for several days, admitted to ICU for aggressive hydration, pressor support.   2. staph aureus bacteremia, continue IV vancomycin, ID is consulted.    3.   septic shock secondary to bacteremia, patient had elevated procalcitonin up to 55, continue IV antibiotics, pressor support, aggressive hydration.  #4, acute renal failure: Worsening renal failure likely secondary to dehydration, continue IV fluids, pressors.  Nephrology is consulted, avoid nephrotoxic agents. 5.  Acute on chronic anemia of chronic k disease, received 1 unit of blood transfusion. 6.  Shock with multiorgan dysfunction with hypovolemia and hypoxia on admission, condition critical, recommend palliative care consult. 7.  Elevated troponin s non-ST elevation MI: Continue aspirin, Plavix, statins  patient with severe CAD, continue supplemental oxygen, discussed with Dr. Rockey Situ, no plans for intervention.  Due to severe underlying condition 8.  Metabolic encephalopathy secondary to gram-positive bacteremia, renal failure, septic shock. 9.  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  Because of bacteremia, possible ICD infection, continue IV  vancomycin. #10 .diabetes mellitus type 2: Patient is on long-acting, short-acting insulin.  Continue  Condition critical, high risk for cardiac arrest.  Poor prognosis. All the records are reviewed and case discussed with Care Management/Social Workerr. Management plans discussed with the patient, family and they are in agreement.  CODE STATUS: Full code  TOTAL TIME TAKING CARE OF THIS PATIENT: 40 minutes.   Critical to plan discharge disposition at this time.   Epifanio Lesches M.D on 11/23/2018 at 12:01 PM  Between 7am to 6pm - Pager - [phone]  After 6pm go to [email or web address] - password [email or web address] [facility]  Office  [phone]  CC: Primary care physician; Rutherford Guys, MD

## 2018-11-23 NOTE — Progress Notes (Signed)
*  PRELIMINARY RESULTS* Echocardiogram 2D Echocardiogram has been performed.  [name] 11/23/2018, 3:10 PM

## 2018-11-23 NOTE — Patient Outreach (Signed)
Note pt has been admitted on 11/22/18 to [facility] with STEMI, AKI, Sepsis and is in ICU.  I have called [facility] to notify and request if pt has advanced directives. I was only able to leave a message and the triage nurse said she would notify their nurse of pt's admission and status.  [name]. [name], MSN, [facility] Gerontological Nurse Practitioner [facility] Care Management [phone]

## 2018-11-23 NOTE — Consult Note (Addendum)
NAME: [name]  DOB: 17-Dec-1955  MRN: [id number]  Date/Time: 11/23/2018 11:28 AM  REQUESTING PROVIDER:Konidena Subjective:  REASON FOR CONSULT: MSSA bacteremia ? [name] is a 63 y.o. female with a history of recently treated MSSA bacteremia and osteomyelitis of the 2nd left toe with 6 weeks of Iv cefazolin and amputation of the toe, Diabetes mellitus, coronary artery disease, cardiomyopathy, CHF AICD,   is admitted with altered mental status   As per patient she was well on Friday Dec 14th , she had finished IV cefazolin on Dec 13th. On Saturday she was feeling a little weak and sick and thought it was pneumonia and  on Sunday her family found her to be lethargic and obtunded and called EMS She had a temp of 102 in the ED, had platelet count of 84 on admission ( was 157 on 11/10/18) Cr of 3.73 ( was 1.36 as OP on 11/10/18)  Initial concern was low platelet secondary to recent cefazolin so she was started on vancomycin Found to have MSSa in bloodculture,and I am seeing the patient for the same  Pt was initially admitted to [facility] between  12/23-10/09/18 Blood culture on 12/23 had 3 of 3 sets positive for MSSA She was given cefazolin. At that time she had an infected left toe   With abscess  and it was MSSA and she underwent amputation on 10/08/18 . She had an AICD and TEE was neg- as there was a source for the MSSA and it was being treated , AICD was not removed as per the CIED guidelines . The plan was to give 6 weeks of IV cefazolin and then repeat blood culture off antibiotic every week. She was hospitalized twice after that, once for pneumonia  between 1/7-1/10/20  Blood culture done on 10/13/18 was neg.Marland Kitchen She was followed by Cardiologist as OP and the diuretics were being adjusted for edema legs and weight gain.  I saw her in my office on 10/27/18 and the surgical site on the 2nd toe had healed well-    as Hb was low  she was asked to go to the ED. Went to ED on 1/23 but was  discharged from there. Was admitted to [facility] for  CHF exacerbation between 1/25-1/29/20 She was  to finish cefazolin on 11/10/18 but as she had a week supply ( from being in the hospital) she continued for  one more week  and last day was 11/19/18  Had blood culture done on 11/17/18 and it was neg. She presented to the ED on 2/17 with altered mental status.   Medical history Cardiomyopathy Coronary artery disease status post stenting of LAD in 2013 AICD placement in September 2015 Infection of the right second toe leading to partial amputation of the toe History of depression Peripheral neuropathy History of blood transfusion Hypertension OSA on CPAP Hypercholesterolemia Peripheral arterial disease  Past surgical history Partial amputation of the right second toe September 2016 Laminectomy AICD biventricular implantable cardioverter defibrillator placed September 2015 Bilateral oophorectomy Coronary angioplasty with stent placement for the LAD I&D of abscess in the groin Peripheral vascular catheterization Vaginal hysterectomy Tubal ligation    Social history Lives with her husband Non-smoker No alcohol No illegal drug use Has 2 dogs at home     Family History  Problem Relation Age of Onset  . Diabetes Mother   . Hypertension Mother   . Arthritis Mother        knees, lumbar DDD, cervical DDD  . Cancer  Father        prostate,skin,lymphoma.  . Cancer Brother 38       bladder cancer; non-smoker  . Diabetes Maternal Grandmother   . Heart disease Maternal Grandmother   . COPD Maternal Grandmother   . Diabetes Paternal Grandmother   . Obesity Brother   . Diabetes Son   . Hypertension Son   . Cancer Maternal Grandfather   . Diabetes Paternal Grandfather    No Known Allergies I? Current Facility-Administered Medications  Medication Dose Route Frequency Provider Last Rate Last Dose  . 0.9 %  sodium chloride infusion   Intravenous Continuous Tyler Pita, MD 60  mL/hr at 11/23/18 0600    . acetaminophen (TYLENOL) tablet 650 mg  650 mg Oral Q4H PRN Salary, Montell D, MD      . acetaminophen (TYLENOL) tablet 650 mg  650 mg Oral Once Salary, Montell D, MD      . albuterol (PROVENTIL) (2.5 MG/3ML) 0.083% nebulizer solution 2.5 mg  2.5 mg Nebulization Q4H PRN Tyler Pita, MD      . aspirin EC tablet 81 mg  81 mg Oral Daily Salary, Montell D, MD   81 mg at 11/23/18 0904  . clopidogrel (PLAVIX) tablet 75 mg  75 mg Oral Daily Salary, Montell D, MD   75 mg at 11/23/18 0904  . diphenhydrAMINE (BENADRYL) injection 25 mg  25 mg Intravenous Once Salary, Montell D, MD      . DOPamine (INTROPIN) 800 mg in dextrose 5 % 250 mL (3.2 mg/mL) infusion  0-20 mcg/kg/min Intravenous Continuous Rudene Re, MD   Stopped at 11/22/18 1438  . ferrous sulfate tablet 325 mg  325 mg Oral Daily Salary, Montell D, MD   325 mg at 11/23/18 0904  . heparin injection 5,000 Units  5,000 Units Subcutaneous Q8H Merton Border B, MD      . insulin aspart (novoLOG) injection 0-20 Units  0-20 Units Subcutaneous Q4H Tukov-Yual, Magdalene S, NP   7 Units at 11/23/18 0904  . insulin glargine (LANTUS) injection 16 Units  16 Units Subcutaneous QHS Loney Hering D, MD   16 Units at 11/22/18 2224  . MEDLINE mouth rinse  15 mL Mouth Rinse BID Salary, Montell D, MD   15 mL at 11/23/18 0905  . nitroGLYCERIN (NITROSTAT) SL tablet 0.4 mg  0.4 mg Sublingual Q5 min PRN Salary, Montell D, MD      . norepinephrine (LEVOPHED) 4mg  in 272mL premix infusion  0-40 mcg/min Intravenous Continuous Alfred Levins, [place], MD 30 mL/hr at 11/23/18 0746 8 mcg/min at 11/23/18 0746  . omega-3 acid ethyl esters (LOVAZA) capsule 1 g  1 g Oral BID Salary, Montell D, MD   1 g at 11/23/18 0904  . ondansetron (ZOFRAN) injection 4 mg  4 mg Intravenous Q6H PRN Salary, Montell D, MD      . rosuvastatin (CRESTOR) tablet 40 mg  40 mg Oral QHS Salary, Holly Bodily D, MD   40 mg at 11/22/18 2223  . sodium chloride flush (NS) 0.9 %  injection 10-40 mL  10-40 mL Intracatheter Q12H Tyler Pita, MD   10 mL at 11/22/18 2224  . sodium chloride flush (NS) 0.9 % injection 10-40 mL  10-40 mL Intracatheter PRN Tyler Pita, MD      . vancomycin variable dose per unstable renal function (pharmacist dosing)   Does not apply See admin instructions Tyler Pita, MD         Abtx:  Anti-infectives (From admission, onward)  Start     Dose/Rate Route Frequency Ordered Stop   11/23/18 1000  ceFAZolin (ANCEF) IVPB 2g/100 mL premix  Status:  Discontinued     2 g 200 mL/hr over 30 Minutes Intravenous Every 12 hours 11/22/18 1946 11/22/18 2142   11/23/18 0715  vancomycin (VANCOCIN) 2,000 mg in sodium chloride 0.9 % 500 mL IVPB  Status:  Discontinued     2,000 mg 250 mL/hr over 120 Minutes Intravenous  Once 11/23/18 0706 11/23/18 0707   11/22/18 2141  vancomycin variable dose per unstable renal function (pharmacist dosing)      Does not apply See admin instructions 11/22/18 2142     11/22/18 1800  ceFEPIme (MAXIPIME) 1 g in sodium chloride 0.9 % 100 mL IVPB  Status:  Discontinued     1 g 200 mL/hr over 30 Minutes Intravenous Every 12 hours 11/22/18 1518 11/22/18 1946   11/22/18 1530  vancomycin (VANCOCIN) IVPB 1000 mg/200 mL premix     1,000 mg 200 mL/hr over 60 Minutes Intravenous  Once 11/22/18 1518 11/22/18 1835   11/22/18 1518  vancomycin variable dose per unstable renal function (pharmacist dosing)  Status:  Discontinued      Does not apply See admin instructions 11/22/18 1518 11/22/18 1946   11/22/18 1515  ceFEPIme (MAXIPIME) 1 g in sodium chloride 0.9 % 100 mL IVPB  Status:  Discontinued     1 g 200 mL/hr over 30 Minutes Intravenous Every 8 hours 11/22/18 1513 11/22/18 1518   11/22/18 1200  piperacillin-tazobactam (ZOSYN) IVPB 3.375 g     3.375 g 100 mL/hr over 30 Minutes Intravenous  Once 11/22/18 1150 11/22/18 1231   11/22/18 1200  vancomycin (VANCOCIN) IVPB 1000 mg/200 mL premix     1,000 mg 200 mL/hr over  60 Minutes Intravenous  Once 11/22/18 1150 11/22/18 1306      REVIEW OF SYSTEMS:  Const: fever, negative chills, negative weight loss Eyes: negative diplopia or visual changes, negative eye pain ENT: negative coryza, negative sore throat Resp:has  cough, no hemoptysis, has dyspnea Cards: negative for chest pain, palpitations, has  lower extremity edema GU: negative for frequency, dysuria and hematuria GI: Negative for abdominal pain, diarrhea, bleeding, constipation Skin: negative for rash and pruritus Heme: negative for easy bruising and gum/nose bleeding MS: generalized weakness and fatigue Neurolo:was lethargic Psych: negative for feelings of anxiety, depression  Endocrine: no polyuria Allergy/Immunology- negative for any medication or food allergies ?  Objective:  VITALS:  BP (!) 121/48   Pulse 75   Temp 98.4 F (36.9 C) (Oral)   Resp 20   Ht 5\' 8"  (1.727 m)   Wt 128.4 kg   SpO2 94%   BMI 43.04 kg/m  PHYSICAL EXAM:  General: Alert, cooperative, no distress, appears stated age. Pale  Head: Normocephalic, without obvious abnormality, atraumatic. Eyes: Conjunctivae clear, anicteric sclerae. Pupils are equal ENT Nares normal. No drainage or sinus tenderness. Lips, mucosa, and tongue normal. No Thrush Neck: Supple, symmetrical, no adenopathy, thyroid: non tender no carotid bruit and no JVD. Back: No CVA tenderness. Lungs: Clear to auscultation bilaterally. No Wheezing or Rhonchi. No rales. Heart: s1s2 tachycardia ICD site no erythema or tenderness  Abdomen: Soft, non-tender,not distended. Bowel sounds normal. No masses Extremities: rt foot- 2 nd toe- partial amputation- heathy Left foot 2nd toe- amputated- site healed completely      small scab on the left 5th toe with dried blood    Has ankle edema Skin: No rashes or lesions. Or bruising  Lymph: Cervical, supraclavicular normal. Neurologic: Grossly non-focal Pertinent Labs Lab Results CBC    Component  Value Date/Time   WBC 7.4 11/23/2018 0526   RBC 3.31 (L) 11/23/2018 0526   HGB 9.1 (L) 11/23/2018 0526   HGB 7.9 (L) 10/22/2018 1419   HCT 29.4 (L) 11/23/2018 0526   HCT 24.8 (L) 10/22/2018 1419   PLT 101 (L) 11/23/2018 0526   PLT 170 10/22/2018 1419   MCV 88.8 11/23/2018 0526   MCV 91 10/22/2018 1419   MCH 27.5 11/23/2018 0526   MCHC 31.0 11/23/2018 0526   RDW 15.8 (H) 11/23/2018 0526   RDW 13.0 10/22/2018 1419   LYMPHSABS 0.3 (L) 11/22/2018 1116   LYMPHSABS 1.7 04/15/2018 1532   MONOABS 0.3 11/22/2018 1116   EOSABS 0.1 11/22/2018 1116   EOSABS 0.1 04/15/2018 1532   BASOSABS 0.0 11/22/2018 1116   BASOSABS 0.0 04/15/2018 1532    CMP Latest Ref Rng & Units 11/23/2018 11/22/2018 11/11/2018  Glucose 70 - 99 mg/dL 284(H) 251(H) 142(H)  BUN 8 - 23 mg/dL 65(H) 58(H) 43(H)  Creatinine 0.44 - 1.00 mg/dL 3.96(H) 3.73(H) 1.53(H)  Sodium 135 - 145 mmol/L 135 132(L) 139  Potassium 3.5 - 5.1 mmol/L 3.8 4.1 4.1  Chloride 98 - 111 mmol/L 99 93(L) 100  CO2 22 - 32 mmol/L 24 27 31   Calcium 8.9 - 10.3 mg/dL 7.2(L) 7.5(L) 8.2(L)  Total Protein 6.5 - 8.1 g/dL 5.9(L) 6.6 -  Total Bilirubin 0.3 - 1.2 mg/dL 0.7 0.8 -  Alkaline Phos 38 - 126 U/L 44 43 -  AST 15 - 41 U/L 105(H) 197(H) -  ALT 0 - 44 U/L 28 33 -      Microbiology: Recent Results (from the past 240 hour(s))  Blood culture (routine x 2)     Status: Abnormal (Preliminary result)   Collection Time: 11/22/18 11:16 AM  Result Value Ref Range Status   Specimen Description   Final    BLOOD BLOOD LEFT HAND Performed at [facility], [address], [place], [place] [address]    Special Requests   Final    BOTTLES DRAWN AEROBIC AND ANAEROBIC Blood Culture adequate volume Performed at [facility], [address], [place], [place] [address]    Culture  Setup Time   Final    GRAM POSITIVE COCCI IN BOTH AEROBIC AND ANAEROBIC BOTTLES CRITICAL RESULT CALLED TO, READ BACK BY AND VERIFIED WITH: LISA KLUTTZ 11/22/18 @  1837  [place]    Culture (A)  Final    STAPHYLOCOCCUS AUREUS SUSCEPTIBILITIES TO FOLLOW Performed at [facility], [place] [address], [place], [place] [address]    Report Status PENDING  Incomplete  Blood Culture ID Panel (Reflexed)     Status: Abnormal   Collection Time: 11/22/18 11:16 AM  Result Value Ref Range Status   Enterococcus species NOT DETECTED NOT DETECTED Final   Listeria monocytogenes NOT DETECTED NOT DETECTED Final   Staphylococcus species DETECTED (A) NOT DETECTED Final    Comment: CRITICAL RESULT CALLED TO, READ BACK BY AND VERIFIED WITH: LISA KLUTTZ 11/22/18 @ 1837  MLK    Staphylococcus aureus (BCID) DETECTED (A) NOT DETECTED Final    Comment: Methicillin (oxacillin) susceptible Staphylococcus aureus (MSSA). Preferred therapy is anti staphylococcal beta lactam antibiotic (Cefazolin or Nafcillin), unless clinically contraindicated. CRITICAL RESULT CALLED TO, READ BACK BY AND VERIFIED WITH: LISA KLUTTZ 11/22/18 @ 1837  MLK    Methicillin resistance NOT DETECTED NOT DETECTED Final   Streptococcus species NOT DETECTED  NOT DETECTED Final   Streptococcus agalactiae NOT DETECTED NOT DETECTED Final   Streptococcus pneumoniae NOT DETECTED NOT DETECTED Final   Streptococcus pyogenes NOT DETECTED NOT DETECTED Final   Acinetobacter baumannii NOT DETECTED NOT DETECTED Final   Enterobacteriaceae species NOT DETECTED NOT DETECTED Final   Enterobacter cloacae complex NOT DETECTED NOT DETECTED Final   Escherichia coli NOT DETECTED NOT DETECTED Final   Klebsiella oxytoca NOT DETECTED NOT DETECTED Final   Klebsiella pneumoniae NOT DETECTED NOT DETECTED Final   Proteus species NOT DETECTED NOT DETECTED Final   Serratia marcescens NOT DETECTED NOT DETECTED Final   Haemophilus influenzae NOT DETECTED NOT DETECTED Final   Neisseria meningitidis NOT DETECTED NOT DETECTED Final   Pseudomonas aeruginosa NOT DETECTED NOT DETECTED Final   Candida albicans NOT DETECTED NOT DETECTED Final    Candida glabrata NOT DETECTED NOT DETECTED Final   Candida krusei NOT DETECTED NOT DETECTED Final   Candida parapsilosis NOT DETECTED NOT DETECTED Final   Candida tropicalis NOT DETECTED NOT DETECTED Final    Comment: Performed at [facility], [address] [place], [place] [address]  MRSA PCR Screening     Status: None   Collection Time: 11/22/18  3:44 PM  Result Value Ref Range Status   MRSA by PCR NEGATIVE NEGATIVE Final    Comment:        The GeneXpert MRSA Assay (FDA approved for NASAL specimens only), is one component of a comprehensive MRSA colonization surveillance program. It is not intended to diagnose MRSA infection nor to guide or monitor treatment for MRSA infections. Performed at [facility], [address] [place], [place] [address]   Blood culture (routine x 2)     Status: None (Preliminary result)   Collection Time: 11/22/18  6:31 PM  Result Value Ref Range Status   Specimen Description   Final    BLOOD RIGHT HAND Performed at [facility], [place] [address] [place], [place] [address]    Special Requests   Final    BOTTLES DRAWN AEROBIC ONLY Blood Culture results may not be optimal due to an inadequate volume of blood received in culture bottles   Culture  Setup Time   Final    GRAM POSITIVE COCCI AEROBIC BOTTLE ONLY CRITICAL RESULT CALLED TO, READ BACK BY AND VERIFIED WITHCurt Jews AT 6767 11/23/18 [place] Performed at [facility], [address], [place], [place] [address]    Culture [facility] POSITIVE COCCI  Final   Report Status PENDING  Incomplete    IMAGING RESULTS:  I have personally reviewed the films ? Impression/Recommendation ? 63 y.o. 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 ICD was not removed as she had a clear source for the bacteremia and there was no pocket infection or endocarditis or lead vegetation on TEE. Blood culture from 12/24, 1/7 and 2/11 were neg ( all on antibiotics) She was followed by me and cardiologist as OP and the plan was to monitor her with  weekly blood culture for 3 weeks  after completion of antibiotic Her last day of antibiotic was 2/13 and she now has MSSA in blood from 11/22/18-  Now the AICD will have to be removed- eventhough no evidence of pocket infection- She will need repeat TEE to make sure there is no valve  Vegetation Repeat culture until clear of bacteremia  Discussed with Dr.Klein and other cardiologists ? Thrombocytopenia likely due to sepsis - doubt due to cefazolin ( normal platelet on 6th week of cefazolin- on 11/10/18 Change vancomycin to nafcillin- if she does not tolerate nafcillin then will give cefazolin  AKI on CKD, could be due to sepsis, diuresis ,?? Due to cephalosporin- ( unlikely as she cr of 1.38 on 2/4 ( 6 weeks of cefazolin)  Hypoxemia secondary to sepsis with underlying CHF  Recent Acute osteomyelitis of the left 2nd toe- treated  with surgery and IV antibiotic - no evidence of residual infection and neither is it the cause for her bacteremia  CHF-management as per cardiologist ___________________________________________________ Discussed with patient,and care team Note:  This document was prepared using Dragon voice recognition software and may include unintentional dictation errors.

## 2018-11-24 ENCOUNTER — Inpatient Hospital Stay: Payer: PPO

## 2018-11-24 DIAGNOSIS — B9561 Methicillin susceptible Staphylococcus aureus infection as the cause of diseases classified elsewhere: Secondary | ICD-10-CM

## 2018-11-24 DIAGNOSIS — R7881 Bacteremia: Secondary | ICD-10-CM

## 2018-11-24 DIAGNOSIS — E1122 Type 2 diabetes mellitus with diabetic chronic kidney disease: Secondary | ICD-10-CM

## 2018-11-24 DIAGNOSIS — T827XXA Infection and inflammatory reaction due to other cardiac and vascular devices, implants and grafts, initial encounter: Principal | ICD-10-CM

## 2018-11-24 LAB — GLUCOSE, CAPILLARY
Glucose-Capillary: 171 mg/dL — ABNORMAL HIGH (ref 70–99)
Glucose-Capillary: 189 mg/dL — ABNORMAL HIGH (ref 70–99)
Glucose-Capillary: 194 mg/dL — ABNORMAL HIGH (ref 70–99)
Glucose-Capillary: 209 mg/dL — ABNORMAL HIGH (ref 70–99)
Glucose-Capillary: 216 mg/dL — ABNORMAL HIGH (ref 70–99)
Glucose-Capillary: 220 mg/dL — ABNORMAL HIGH (ref 70–99)

## 2018-11-24 LAB — CBC
HCT: 29 % — ABNORMAL LOW (ref 36.0–46.0)
Hemoglobin: 9.1 g/dL — ABNORMAL LOW (ref 12.0–15.0)
MCH: 28.2 pg (ref 26.0–34.0)
MCHC: 31.4 g/dL (ref 30.0–36.0)
MCV: 89.8 fL (ref 80.0–100.0)
Platelets: 97 10*3/uL — ABNORMAL LOW (ref 150–400)
RBC: 3.23 MIL/uL — ABNORMAL LOW (ref 3.87–5.11)
RDW: 15.5 % (ref 11.5–15.5)
WBC: 7.1 10*3/uL (ref 4.0–10.5)
nRBC: 0 % (ref 0.0–0.2)

## 2018-11-24 LAB — BASIC METABOLIC PANEL
Anion gap: 9 (ref 5–15)
BUN: 69 mg/dL — ABNORMAL HIGH (ref 8–23)
CO2: 22 mmol/L (ref 22–32)
Calcium: 7 mg/dL — ABNORMAL LOW (ref 8.9–10.3)
Chloride: 101 mmol/L (ref 98–111)
Creatinine, Ser: 3.6 mg/dL — ABNORMAL HIGH (ref 0.44–1.00)
GFR calc Af Amer: 15 mL/min — ABNORMAL LOW (ref >60)
GFR calc non Af Amer: 13 mL/min — ABNORMAL LOW (ref >60)
Glucose, Bld: 211 mg/dL — ABNORMAL HIGH (ref 70–99)
Potassium: 3.5 mmol/L (ref 3.5–5.1)
SODIUM: 132 mmol/L — AB (ref 135–145)

## 2018-11-24 LAB — CULTURE, BLOOD (ROUTINE X 2): Special Requests: ADEQUATE

## 2018-11-24 LAB — HIV ANTIBODY (ROUTINE TESTING W REFLEX): HIV Screen 4th Generation wRfx: NONREACTIVE

## 2018-11-24 LAB — RPR: RPR Ser Ql: NONREACTIVE

## 2018-11-24 MED ORDER — INSULIN ASPART 100 UNIT/ML ~~LOC~~ SOLN: 0.0000 [IU] | Freq: Every day | SUBCUTANEOUS | 11 refills | Status: DC

## 2018-11-24 MED ORDER — INSULIN ASPART 100 UNIT/ML ~~LOC~~ SOLN: 0.0000 [IU] | Freq: Three times a day (TID) | SUBCUTANEOUS | 11 refills | Status: DC

## 2018-11-24 MED ORDER — ALBUTEROL SULFATE (2.5 MG/3ML) 0.083% IN NEBU: 2.5000 mg | INHALATION_SOLUTION | RESPIRATORY_TRACT | 12 refills | Status: DC | PRN

## 2018-11-24 MED ORDER — ASPIRIN 81 MG PO TBEC: 81.0000 mg | DELAYED_RELEASE_TABLET | Freq: Every day | ORAL | 0 refills | Status: DC

## 2018-11-24 MED ORDER — CLOPIDOGREL BISULFATE 75 MG PO TABS: 75.0000 mg | ORAL_TABLET | Freq: Every day | ORAL | 0 refills | Status: DC

## 2018-11-24 MED ORDER — ONDANSETRON HCL 4 MG/2ML IJ SOLN: 4.0000 mg | Freq: Four times a day (QID) | INTRAMUSCULAR | 0 refills | Status: DC | PRN

## 2018-11-24 MED ORDER — INSULIN GLARGINE 100 UNIT/ML ~~LOC~~ SOLN: 16.0000 [IU] | Freq: Every day | SUBCUTANEOUS | 11 refills | Status: DC

## 2018-11-24 NOTE — Progress Notes (Addendum)
[name] NAME: [name]    MR#:  [id number]  DATE OF BIRTH:  02-Apr-1956  Patient is here for non-ST elevation MI, recurrent MSSA bacteremia, still on pressors.  Today she feels less nauseous and feels better than a long time.  CHIEF COMPLAINT:   Chief Complaint  Patient presents with  . Altered Mental Status    REVIEW OF SYSTEMS:    Review of Systems  Constitutional: Negative for chills and fever.  HENT: Negative for hearing loss.   Eyes: Negative for blurred vision, double vision and photophobia.  Respiratory: Negative for cough, hemoptysis and shortness of breath.   Cardiovascular: Negative for palpitations, orthopnea and leg swelling.  Gastrointestinal: Negative for abdominal pain, diarrhea and vomiting.  Genitourinary: Negative for dysuria and urgency.  Musculoskeletal: Negative for myalgias and neck pain.  Skin: Negative for rash.  Neurological: Negative for dizziness, focal weakness, seizures, weakness and headaches.  Psychiatric/Behavioral: Negative for memory loss. The patient does not have insomnia.     Nutrition:  Tolerating Diet: Tolerating PT:      DRUG ALLERGIES:  No Known Allergies  VITALS:  Blood pressure 135/63, pulse 78, temperature 98.7 F (37.1 C), temperature source Axillary, resp. rate (!) 23, height 5\' 8"  (1.727 m), weight 131.2 kg, SpO2 90 %.  PHYSICAL EXAMINATION:   Physical Exam  GENERAL:  63 y.o.-year-old patient lying in the bed with no acute distress.  Appears dehydrated EYES: Pupils equal, round, reactive to light . No scleral icterus. Extraocular muscles intact.  HEENT: Head atraumatic, normocephalic. Oropharynx and nasopharynx clear.  Patient mucosa is dry. NECK:  Supple, no jugular venous distention. No thyroid enlargement, no tenderness.  LUNGS: Diminished air entry bilaterally.  CARDIOVASCULAR: S1 S1, S2 regular, patient has systolic murmur grade 2/6, diastolic  murmur with a grade 2/6 present   aBDOMEN: Soft, nontender, nondistended. Bowel sounds present. No organomegaly or mass.  EXTREMITIES: No pedal edema, cyanosis, or clubbing.  NEUROLOGIC:  lethargic, unable to follow commands for full neuro exam this morning. PSYCHIATRIC: The patient is alert and oriented x 3.  SKIN: No obvious rash, lesion, or ulcer.    LABORATORY PANEL:   CBC Recent Labs  Lab 11/24/18 0519  WBC 7.1  HGB 9.1*  HCT 29.0*  PLT 97*   ------------------------------------------------------------------------------------------------------------------  Chemistries  Recent Labs  Lab 11/23/18 0526 11/24/18 0519  NA 135 132*  K 3.8 3.5  CL 99 101  CO2 24 22  GLUCOSE 284* 211*  BUN 65* 69*  CREATININE 3.96* 3.60*  CALCIUM 7.2* 7.0*  MG 1.3*  --   AST 105*  --   ALT 28  --   ALKPHOS 44  --   BILITOT 0.7  --    ------------------------------------------------------------------------------------------------------------------  Cardiac Enzymes Recent Labs  Lab 11/22/18 1116  TROPONINI 9.30*   ------------------------------------------------------------------------------------------------------------------  RADIOLOGY:  Dg Chest Port 1 View  Result Date: 11/24/2018 CLINICAL DATA:  Respiratory failure EXAM: PORTABLE CHEST 1 VIEW COMPARISON:  Two days ago FINDINGS: Biventricular pacer from the left in stable position. Right IJ line with tip at the upper cavoatrial junction. Stable borderline heart size. Stable asymmetric hazy right perihilar opacity on a background of generalized interstitial coarsening. No visible effusion. No pneumothorax. History bacteremia IMPRESSION: Right perihilar infiltrate or atelectasis remains stable. Electronically Signed   By: Monte Fantasia M.D.   On: 11/24/2018 07:56   Dg Chest Port 1 View  Result Date: 11/22/2018 CLINICAL DATA:  Central line  placement EXAM: PORTABLE CHEST 1 VIEW COMPARISON:  November 22, 2018 FINDINGS: The new  right central line terminates near the caval atrial junction. No pneumothorax. Right perihilar opacity is a little less conspicuous but remains. Mild increased interstitial opacities suggest pulmonary venous congestion. The cardiomediastinal silhouette is stable. No other change. IMPRESSION: 1. The new right central line terminates near the caval atrial junction in good position with no pneumothorax. 2. Probable pulmonary venous congestion/mild edema. 3. Persistent but slightly less prominent right perihilar opacity. Recommend follow-up to resolution. Electronically Signed   By: Dorise Bullion III M.D   On: 11/22/2018 18:38   Dg Chest Portable 1 View  Result Date: 11/22/2018 CLINICAL DATA:  Altered mental status. Treated for pneumonia 2 weeks ago. EXAM: PORTABLE CHEST 1 VIEW COMPARISON:  Radiographs 10/30/2018 and 10/27/2018. FINDINGS: 1121 hours. Two views submitted. Right arm PICC extends to the superior cavoatrial junction. Left subclavian defibrillator leads appear grossly unchanged within the right atrium, right ventricle and coronary sinus. There is stable mild cardiomegaly and aortic atherosclerosis. Pulmonary edema has improved. There is stable patchy opacity at the right lung base. Veiling opacity in the right perihilar region has increased, suspicious for fluid in the fissure, less likely consolidation in the superior segment of the lower lobe. IMPRESSION: 1. Interval increased right perihilar opacity, probably fluid in the fissure versus consolidation in the superior segment of the lower lobe. 2. Patchy right basilar airspace disease is unchanged. No definite residual edema. Electronically Signed   By: Richardean Sale M.D.   On: 11/22/2018 12:22     ASSESSMENT AND PLAN:   Active Problems:   NSTEMI (non-ST elevated myocardial infarction) ([place])   #1 hypovolemic shock secondary to not eating and drinking for several days, admitted to ICU for aggressive hydration, pressor support..  Can  transfer the patient from ICU level care to stepdown status.  2. staph aureus bacteremia, continue IV vancomycin, ID is consulted.    3.   septic shock secondary to bacteremia, patient had elevated procalcitonin up to 55, antibiotics were changed to nafcillin.    #4, acute renal failure: 5.  Acute on chronic anemia of chronic k disease, received 1 unit of blood transfusion. 6.  Shock with multiorgan dysfunction with hypovolemia and hypoxia on admission, condition critical, recommend palliative care consult. 7.  Elevated troponin s non-ST elevation MI: Continue aspirin, Plavix, statins  patient with severe CAD, continue supplemental oxygen, 9.  Recurrent MSSA bacteremia, on nafcillin, patient needs ICD removal, seen by EP physician Dr. Caryl Comes recommends transfer to tertiary facility, patient [facility], patient is accepted at [facility] under cardiology service, Dr. Enrigue Catena is accepting physician patient will go to [facility] when the bed is available.  Patient will be transferred from ICU level of care to stepdown status.    #10 .diabetes mellitus type 2: Patient is on long-acting, short-acting insulin.  Continue     Condition critical, high risk for cardiac arrest.  Poor prognosis. All the records are reviewed and case discussed with Care Management/Social Workerr. Management plans discussed with the patient, family and they are in agreement.  CODE STATUS: Full code  TOTAL TIME TAKING CARE OF THIS PATIENT: 40 minutes.   Patient will go to [facility] when the bed is available.  Discharge summary, instructions, EMTALAare already filled.  Case discussed with cardiology, intensivist. Epifanio Lesches M.D on 11/24/2018 at 10:19 AM  Between 7am to 6pm - Pager - [phone]  After 6pm go to [email or web address] - password  [facility]  [facility]  Office  [phone]  CC: Primary care physician; Rutherford Guys, MD

## 2018-11-24 NOTE — Progress Notes (Signed)
Progress Note  Patient Name: [name] Date of Encounter: 11/24/2018  Primary Cardiologist: Dr. Fletcher Anon  Subjective   Chest pain or dyspnea.  Understands the implications of bacteremia with need for device extraction.  Inpatient Medications    Scheduled Meds: . aspirin EC  81 mg Oral Daily  . clopidogrel  75 mg Oral Daily  . ferrous sulfate  325 mg Oral Daily  . heparin injection (subcutaneous)  5,000 Units Subcutaneous Q8H  . insulin aspart  0-15 Units Subcutaneous TID WC  . insulin aspart  0-5 Units Subcutaneous QHS  . insulin glargine  16 Units Subcutaneous QHS  . mouth rinse  15 mL Mouth Rinse BID  . omega-3 acid ethyl esters  1 g Oral BID  . rosuvastatin  40 mg Oral QHS  . sodium chloride flush  10-40 mL Intracatheter Q12H   Continuous Infusions: . nafcillin IV 2 g (11/24/18 0828)  . norepinephrine (LEVOPHED) Adult infusion 14 mcg/min (11/24/18 0600)   PRN Meds: acetaminophen, albuterol, nitroGLYCERIN, ondansetron (ZOFRAN) IV, sodium chloride flush   Vital Signs    Vitals:   11/24/18 0648 11/24/18 0700 11/24/18 0800 11/24/18 0822  BP: (!) 125/47 (!) 125/49 135/63   Pulse: 73 76 78 78  Resp: (!) 21 16 16  (!) 23  Temp:    98.7 F (37.1 C)  TempSrc:    Axillary  SpO2: 90% 91% 93% 90%  Weight:      Height:        Intake/Output Summary (Last 24 hours) at 11/24/2018 0912 Last data filed at 11/24/2018 0600 Gross per 24 hour  Intake 1525.95 ml  Output 485 ml  Net 1040.95 ml   Last 3 Weights 11/24/2018 11/22/2018 11/22/2018  Weight (lbs) 289 lb 3.9 oz 283 lb 1.1 oz 293 lb  Weight (kg) 131.2 kg 128.4 kg 132.904 kg  Some encounter information is confidential and restricted. Go to Review Flowsheets activity to see all data.      Telemetry    Sinus with pacing   ECG    Not performed today- Personally Reviewed  Physical Exam   Well developed and nourished in no acute distress HENT normal Neck supple with JVP unable to discern with IJ line in  place Crackles bilaterally Regular rate and rhythm with a 2/6 systolic murmur Abd-soft with active BS No Clubbing cyanosis 1+ edema Skin-warm and dry A & Oriented  Grossly normal sensory and motor function    Labs    Chemistry Recent Labs  Lab 11/22/18 1116 11/23/18 0526 11/24/18 0519  NA 132* 135 132*  K 4.1 3.8 3.5  CL 93* 99 101  CO2 27 24 22   GLUCOSE 251* 284* 211*  BUN 58* 65* 69*  CREATININE 3.73* 3.96* 3.60*  CALCIUM 7.5* 7.2* 7.0*  PROT 6.6 5.9*  --   ALBUMIN 2.9* 2.5*  --   AST 197* 105*  --   ALT 33 28  --   ALKPHOS 43 44  --   BILITOT 0.8 0.7  --   GFRNONAA 12* 11* 13*  GFRAA 14* 13* 15*  ANIONGAP 12 12 9      Hematology Recent Labs  Lab 11/22/18 1116 11/22/18 1850 11/23/18 0526 11/24/18 0519  WBC 4.8  --  7.4 7.1  RBC 2.44* 3.36* 3.31* 3.23*  HGB 7.0* 9.4* 9.1* 9.1*  HCT 22.4* 30.0* 29.4* 29.0*  MCV 91.8  --  88.8 89.8  MCH 28.7  --  27.5 28.2  MCHC 31.3  --  31.0 31.4  RDW 13.9  --  15.8* 15.5  PLT 84*  --  101* 97*    Cardiac Enzymes Recent Labs  Lab 11/22/18 1116  TROPONINI 9.30*   No results for input(s): TROPIPOC in the last 168 hours.   BNP Recent Labs  Lab 11/22/18 1116  BNP 2,246.0*     DDimer No results for input(s): DDIMER in the last 168 hours.   Radiology    Dg Chest Port 1 View  Result Date: 11/24/2018 CLINICAL DATA:  Respiratory failure EXAM: PORTABLE CHEST 1 VIEW COMPARISON:  Two days ago FINDINGS: Biventricular pacer from the left in stable position. Right IJ line with tip at the upper cavoatrial junction. Stable borderline heart size. Stable asymmetric hazy right perihilar opacity on a background of generalized interstitial coarsening. No visible effusion. No pneumothorax. History bacteremia IMPRESSION: Right perihilar infiltrate or atelectasis remains stable. Electronically Signed   By: Monte Fantasia M.D.   On: 11/24/2018 07:56   Dg Chest Port 1 View  Result Date: 11/22/2018 CLINICAL DATA:  Central line  placement EXAM: PORTABLE CHEST 1 VIEW COMPARISON:  November 22, 2018 FINDINGS: The new right central line terminates near the caval atrial junction. No pneumothorax. Right perihilar opacity is a little less conspicuous but remains. Mild increased interstitial opacities suggest pulmonary venous congestion. The cardiomediastinal silhouette is stable. No other change. IMPRESSION: 1. The new right central line terminates near the caval atrial junction in good position with no pneumothorax. 2. Probable pulmonary venous congestion/mild edema. 3. Persistent but slightly less prominent right perihilar opacity. Recommend follow-up to resolution. Electronically Signed   By: Dorise Bullion III M.D   On: 11/22/2018 18:38   Dg Chest Portable 1 View  Result Date: 11/22/2018 CLINICAL DATA:  Altered mental status. Treated for pneumonia 2 weeks ago. EXAM: PORTABLE CHEST 1 VIEW COMPARISON:  Radiographs 10/30/2018 and 10/27/2018. FINDINGS: 1121 hours. Two views submitted. Right arm PICC extends to the superior cavoatrial junction. Left subclavian defibrillator leads appear grossly unchanged within the right atrium, right ventricle and coronary sinus. There is stable mild cardiomegaly and aortic atherosclerosis. Pulmonary edema has improved. There is stable patchy opacity at the right lung base. Veiling opacity in the right perihilar region has increased, suspicious for fluid in the fissure, less likely consolidation in the superior segment of the lower lobe. IMPRESSION: 1. Interval increased right perihilar opacity, probably fluid in the fissure versus consolidation in the superior segment of the lower lobe. 2. Patchy right basilar airspace disease is unchanged. No definite residual edema. Electronically Signed   By: Richardean Sale M.D.   On: 11/22/2018 12:22    Cardiac Studies   LHC 10/05/18  Prox Cx to Dist Cx lesion is 80% stenosed.  Prox LAD-1 lesion is 40% stenosed.  Prox LAD-2 lesion is 90% stenosed.  Prox LAD  to Mid LAD lesion is 80% stenosed.  Mid LAD to Dist LAD lesion is 70% stenosed.  1st Diag lesion is 90% stenosed.  Ost 1st Diag lesion is 80% stenosed.  Mid RCA-1 lesion is 40% stenosed.  Mid RCA-2 lesion is 40% stenosed. Final Conclusions:  Severe disease of LAD and diagonal branch Patent proximal LAD stent with severe stenosis at the distal edge of the stent Remainder of the LAD is also small, diffusely diseased, diagonal branch diffusely diseased -RCA large vessel dominant with mild disease, left circumflex/OM with mild disease proximal region  Recommendations:  Case discussed with interventional cardiology, Placing a short stent in the distal edge of the LAD stent may  provide little clinical benefit with high risk and may delay surgery on her osteomyelitis/toe infection. Troponin elevation likely secondary to demand ischemia in the setting of febrile, tachycardic, hypertensive on arrival in the setting of bacteremia --Would favor medical management at this time and proceeding with removal of her toe.   TEE 10/02/18 - Left ventricle: Systolic function was mildly reduced. The estimated ejection fraction was in the range of 45% to 50%. - Aortic valve: No evidence of vegetation. There was trivial regurgitation. - Mitral valve: There was mild regurgitation. - Left atrium: No evidence of thrombus in the atrial cavity or appendage. - Right atrium: No evidence of thrombus in the atrial cavity or appendage. - Atrial septum: Echo contrast study showed no right-to-left atrial level shunt, at baseline or with provocation. - Pulmonic valve: No evidence of vegetation.  Impressions: - No evidence of valve vegetation or wire associated vegetation.  TTE 09/29/18 - Left ventricle: The cavity size was normal. Systolic function was mildly reduced. The estimated ejection fraction was in the range of 45% to 50%. The study is not technically sufficient to allow evaluation  of LV diastolic function. - Left atrium: The atrium was mildly dilated.  Impressions: - Very suboptimal study. No evidence of vegetations. Consider TEE.  Patient Profile     63 y.o. female with history of coronary artery disease, chronic systolic heart failure status post CRT-D in 2015 with improvement in ejection fraction to 45%, type 2 diabetes, hypertension, hyperlipidemia and sleep apnea    Date Cr K TSH Hgb  7/17 1.53 5.0    8/18 1.96 5.4    7/19   1.46(4/19) 12.3  10/19 2.00 4.5       DATE TEST EF   9/15  echo  30 %   1 /17 echo  50 %   12/19 Echo  45-50%   2/20 Echo  40-45%      Assessment & Plan    MSSA bacteremia-recurrent   CIED CRT- D implanted 9/15 for primary prevention and heart failure  Non-STEMI thought probably demand   Coronary artery disease by revascularization with residual high-grade stenosis   Acute renal failure   Osteomyelitis healed status post amputation  Heart failure-systolic/diastolic-acute/chronic      The patient has recurrent MSSA bacteremia.  Her implanted device is likely the nidus for the recurrent infection and will need to be removed.  Currently this procedure is not been going to: Hospital and she will need to be transferred to either [facility] or [place].  It is her preference to go to [facility].  For now, would continue supportive care.      Signed, Virl Axe, MD  11/24/2018, 9:12 AM

## 2018-11-24 NOTE — Progress Notes (Signed)
Inpatient Diabetes Program Recommendations  AACE/ADA: New Consensus Statement on Inpatient Glycemic Control   Target Ranges:  Prepandial:   less than 140 mg/dL      Peak postprandial:   less than 180 mg/dL (1-2 hours)      Critically ill patients:  140 - 180 mg/dL   Results for [name], [name] (MRN [id number]) as of 11/24/2018 08:42  Ref. Range 11/23/2018 09:00 11/23/2018 11:58 11/23/2018 16:27 11/23/2018 20:24 11/23/2018 21:53 11/24/2018 00:32 11/24/2018 03:32  Glucose-Capillary Latest Ref Range: 70 - 99 mg/dL 224 (H) 205 (H) 178 (H) 232 (H) 210 (H) 220 (H) 209 (H)   Review of Glycemic Control  Diabetes history: DM2 Outpatient Diabetes medications: Amaryl 4 mg BID, Lantus 16 units QHS Current orders for Inpatient glycemic control: Lantus 16 units QHS, Novolog 0-15 units TID with meals, Novolog 0-5 units QHS  Inpatient Diabetes Program Recommendations:   Insulin - Basal: Please consider increasing Lantus to 20 units QHS.  Insulin - Meal Coverage: Please consider ordering Novolog 3 units TID with meals for meal coverage if patient eats at least 50% of meals.  Thanks, Barnie Alderman, RN, MSN, CDE Diabetes Coordinator Inpatient Diabetes Program [phone] (Team Pager from 8am to 5pm)

## 2018-11-24 NOTE — Progress Notes (Signed)
[place], [place] 11/24/18  Subjective:   Patient is critically ill.  Serum creatinine remains elevated, although slight improvement noted from yesterday to 3.60.  Urine output 485 cc   Objective:  Vital signs in last 24 hours:  Temp:  [98.4 F (36.9 C)-98.7 F (37.1 C)] 98.6 F (37 C) (02/18 1213) Pulse Rate:  [69-78] 75 (02/18 1213) Resp:  [13-25] 23 (02/18 1213) BP: (74-135)/(34-63) 110/40 (02/18 1000) SpO2:  [90 %-97 %] 96 % (02/18 1213) Weight:  [131.2 kg] 131.2 kg (02/18 0500)  Weight change: -1.704 kg Filed Weights   11/22/18 1112 11/22/18 1552 11/24/18 0500  Weight: 132.9 kg 128.4 kg 131.2 kg    Intake/Output:    Intake/Output Summary (Last 24 hours) at 11/24/2018 1733 Last data filed at 11/24/2018 1211 Gross per 24 hour  Intake 1160.5 ml  Output 410 ml  Net 750.5 ml     Physical Exam: General:  Critically ill-appearing, laying in the bed  HEENT  dry oral mucous membranes  Neck  supple  Pulm/lungs  coarse breath sounds bilaterally  CVS/Heart  paced rhythm  Abdomen:   Soft, nontender  Extremities:  Trace edema  Neurologic:  Alert, able to answer questions  Foley in place  Basic Metabolic Panel:  Recent Labs  Lab 11/22/18 1116 11/23/18 0526 11/24/18 0519  NA 132* 135 132*  K 4.1 3.8 3.5  CL 93* 99 101  CO2 27 24 22   GLUCOSE 251* 284* 211*  BUN 58* 65* 69*  CREATININE 3.73* 3.96* 3.60*  CALCIUM 7.5* 7.2* 7.0*  MG  --  1.3*  --   PHOS  --  4.4  --      CBC: Recent Labs  Lab 11/22/18 1116 11/22/18 1850 11/23/18 0526 11/24/18 0519  WBC 4.8  --  7.4 7.1  NEUTROABS 4.1  --   --   --   HGB 7.0* 9.4* 9.1* 9.1*  HCT 22.4* 30.0* 29.4* 29.0*  MCV 91.8  --  88.8 89.8  PLT 84*  --  101* 97*     No results found for: HEPBSAG, HEPBSAB, HEPBIGM    Microbiology:  Recent Results (from the past 240 hour(s))  Blood culture (routine x 2)     Status: Abnormal   Collection Time: 11/22/18 11:16 AM  Result Value  Ref Range Status   Specimen Description   Final    BLOOD BLOOD LEFT HAND Performed at [facility], [address], [place], [place] [address]    Special Requests   Final    BOTTLES DRAWN AEROBIC AND ANAEROBIC Blood Culture adequate volume Performed at [facility], [address] [place], [place] [address]    Culture  Setup Time   Final    GRAM POSITIVE COCCI IN BOTH AEROBIC AND ANAEROBIC BOTTLES CRITICAL RESULT CALLED TO, READ BACK BY AND VERIFIED WITH: LISA Monticello 11/22/18 @ 1837  [place] Performed at [facility], [address] [place], [place] [address]    Culture STAPHYLOCOCCUS AUREUS (A)  Final   Report Status 11/24/2018 FINAL  Final   Organism ID, Bacteria STAPHYLOCOCCUS AUREUS  Final      Susceptibility   Staphylococcus aureus - MIC*    CIPROFLOXACIN 1 SENSITIVE Sensitive     ERYTHROMYCIN <=0.25 SENSITIVE Sensitive     GENTAMICIN <=0.5 SENSITIVE Sensitive     OXACILLIN <=0.25 SENSITIVE Sensitive     TETRACYCLINE <=1 SENSITIVE Sensitive     VANCOMYCIN <=0.5 SENSITIVE Sensitive  TRIMETH/SULFA <=10 SENSITIVE Sensitive     CLINDAMYCIN <=0.25 SENSITIVE Sensitive     RIFAMPIN <=0.5 SENSITIVE Sensitive     Inducible Clindamycin NEGATIVE Sensitive     * STAPHYLOCOCCUS AUREUS  Blood Culture ID Panel (Reflexed)     Status: Abnormal   Collection Time: 11/22/18 11:16 AM  Result Value Ref Range Status   Enterococcus species NOT DETECTED NOT DETECTED Final   Listeria monocytogenes NOT DETECTED NOT DETECTED Final   Staphylococcus species DETECTED (A) NOT DETECTED Final    Comment: CRITICAL RESULT CALLED TO, READ BACK BY AND VERIFIED WITH: LISA KLUTTZ 11/22/18 @ 1837  MLK    Staphylococcus aureus (BCID) DETECTED (A) NOT DETECTED Final    Comment: Methicillin (oxacillin) susceptible Staphylococcus aureus (MSSA). Preferred therapy is anti staphylococcal beta lactam antibiotic (Cefazolin or Nafcillin), unless clinically contraindicated. CRITICAL RESULT  CALLED TO, READ BACK BY AND VERIFIED WITH: LISA KLUTTZ 11/22/18 @ 1837  MLK    Methicillin resistance NOT DETECTED NOT DETECTED Final   Streptococcus species NOT DETECTED NOT DETECTED Final   Streptococcus agalactiae NOT DETECTED NOT DETECTED Final   Streptococcus pneumoniae NOT DETECTED NOT DETECTED Final   Streptococcus pyogenes NOT DETECTED NOT DETECTED Final   Acinetobacter baumannii NOT DETECTED NOT DETECTED Final   Enterobacteriaceae species NOT DETECTED NOT DETECTED Final   Enterobacter cloacae complex NOT DETECTED NOT DETECTED Final   Escherichia coli NOT DETECTED NOT DETECTED Final   Klebsiella oxytoca NOT DETECTED NOT DETECTED Final   Klebsiella pneumoniae NOT DETECTED NOT DETECTED Final   Proteus species NOT DETECTED NOT DETECTED Final   Serratia marcescens NOT DETECTED NOT DETECTED Final   Haemophilus influenzae NOT DETECTED NOT DETECTED Final   Neisseria meningitidis NOT DETECTED NOT DETECTED Final   Pseudomonas aeruginosa NOT DETECTED NOT DETECTED Final   Candida albicans NOT DETECTED NOT DETECTED Final   Candida glabrata NOT DETECTED NOT DETECTED Final   Candida krusei NOT DETECTED NOT DETECTED Final   Candida parapsilosis NOT DETECTED NOT DETECTED Final   Candida tropicalis NOT DETECTED NOT DETECTED Final    Comment: Performed at [facility], [address] [place], [place] [address]  MRSA PCR Screening     Status: None   Collection Time: 11/22/18  3:44 PM  Result Value Ref Range Status   MRSA by PCR NEGATIVE NEGATIVE Final    Comment:        The GeneXpert MRSA Assay (FDA approved for NASAL specimens only), is one component of a comprehensive MRSA colonization surveillance program. It is not intended to diagnose MRSA infection nor to guide or monitor treatment for MRSA infections. Performed at [facility], [address] [place], [place] [address]   Blood culture (routine x 2)     Status: Abnormal   Collection Time: 11/22/18  6:31 PM   Result Value Ref Range Status   Specimen Description   Final    BLOOD RIGHT HAND Performed at [facility], [place] [address] [place], [place] [address]    Special Requests   Final    BOTTLES DRAWN AEROBIC ONLY Blood Culture results may not be optimal due to an inadequate volume of blood received in culture bottles Performed at [facility] [place], [place] [address]    Culture  Setup Time   Final    GRAM POSITIVE COCCI AEROBIC BOTTLE ONLY CRITICAL RESULT CALLED TO, READ BACK BY AND VERIFIED WITH: CHRISTINE KATSOUDAS AT 9924 11/23/18 [place] Performed at [facility], [address]  [place], [place] [address]    Culture (A)  Final    STAPHYLOCOCCUS AUREUS SUSCEPTIBILITIES PERFORMED ON PREVIOUS CULTURE WITHIN THE LAST 5 DAYS. Performed at [facility], [place] [address], [place], [place] [address]    Report Status 11/24/2018 FINAL  Final    Coagulation Studies: Recent Labs    11/22/18 1116  LABPROT 17.9*  INR 1.50    Urinalysis: No results for input(s): COLORURINE, LABSPEC, PHURINE, GLUCOSEU, HGBUR, BILIRUBINUR, KETONESUR, PROTEINUR, UROBILINOGEN, NITRITE, LEUKOCYTESUR in the last 72 hours.  Invalid input(s): APPERANCEUR    Imaging: Dg Chest Port 1 View  Result Date: 11/24/2018 CLINICAL DATA:  Respiratory failure EXAM: PORTABLE CHEST 1 VIEW COMPARISON:  Two days ago FINDINGS: Biventricular pacer from the left in stable position. Right IJ line with tip at the upper cavoatrial junction. Stable borderline heart size. Stable asymmetric hazy right perihilar opacity on a background of generalized interstitial coarsening. No visible effusion. No pneumothorax. History bacteremia IMPRESSION: Right perihilar infiltrate or atelectasis remains stable. Electronically Signed   By: Monte Fantasia M.D.   On: 11/24/2018 07:56   Dg Chest Port 1 View  Result Date: 11/22/2018 CLINICAL DATA:  Central line placement EXAM: PORTABLE CHEST 1 VIEW  COMPARISON:  November 22, 2018 FINDINGS: The new right central line terminates near the caval atrial junction. No pneumothorax. Right perihilar opacity is a little less conspicuous but remains. Mild increased interstitial opacities suggest pulmonary venous congestion. The cardiomediastinal silhouette is stable. No other change. IMPRESSION: 1. The new right central line terminates near the caval atrial junction in good position with no pneumothorax. 2. Probable pulmonary venous congestion/mild edema. 3. Persistent but slightly less prominent right perihilar opacity. Recommend follow-up to resolution. Electronically Signed   By: Dorise Bullion III M.D   On: 11/22/2018 18:38     Medications:   . nafcillin IV 2 g (11/24/18 1211)  . norepinephrine (LEVOPHED) Adult infusion 14 mcg/min (11/24/18 0700)   . aspirin EC  81 mg Oral Daily  . clopidogrel  75 mg Oral Daily  . ferrous sulfate  325 mg Oral Daily  . heparin injection (subcutaneous)  5,000 Units Subcutaneous Q8H  . insulin aspart  0-15 Units Subcutaneous TID WC  . insulin aspart  0-5 Units Subcutaneous QHS  . insulin glargine  16 Units Subcutaneous QHS  . mouth rinse  15 mL Mouth Rinse BID  . omega-3 acid ethyl esters  1 g Oral BID  . rosuvastatin  40 mg Oral QHS  . sodium chloride flush  10-40 mL Intracatheter Q12H   acetaminophen, albuterol, nitroGLYCERIN, ondansetron (ZOFRAN) IV, sodium chloride flush  Assessment/ Plan:  63 y.o. female insulin dependent diabetes mellitus type 2, diabetic neuropathy, hypertension, anemia, obstructive sleep apnea requiring CPAP, positive ANA, hyperlipidemia, systolic congestive heart failure status post AICD placement, coronary artery disease status post stent is admitted for sepsis  1.  Acute kidney injury Likely secondary to ATN from sepsis Currently has MSSA bacteremia from cultures drawn on February 16. Serum creatinine is critically elevated although small improvement noted today No acute  indication for dialysis at present but we will continue to monitor closely Patient to be transferred to a tertiary care center for removal of AICD   LOS: 2 Cathryne Mancebo [facility] 2/18/20205:33 PM  [place], [place]  Note: This note was prepared with Dragon dictation. Any transcription errors are unintentional

## 2018-11-24 NOTE — Discharge Summary (Addendum)
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 Type 2 diabetes Obstructive sleep apnea History of ischemic cardiomyopathy, LVEF 35%  DISCHARGE DIAGNOSES:   NSTEMI Acute encephalopathy, resolved Severe sepsis/septic shock, resolving Pulmonary edema, mild Hypoxemic respiratory failure, mild Anemia without evidence of acute blood loss presently AKI, nonoliguric CKD, stage III Type 2 diabetes, controlled Obstructive sleep apnea History of ischemic cardiomyopathy, LVEF 35% Thrombocytopenia (platelets 97,000) Recurrent/relapsing MSSA bacteremia  PRESENTATION:   Pt was admitted by the hospitalist service with the following HPI and the above admission diagnoses:  HISTORY OF PRESENT ILLNESS: [name]  is a 63 y.o. female with a history of myocardial infarction in the past, systolic heart failure with EF of 35%, AICD, coronary artery disease, recent osteomyelitis c/b MSSA bacteremia, neuropathy, obesity, hyperlipidemia, type 2 diabetes mellitus, chronic kidney disease, recently discharged from the hospital 2 weeks ago for heart failure exacerbation, recently completed antibiotic course for MSSA bacteremia, presents from home with altered mental status, complaints of shortness of breath, brought via EMS, noted to be hypoxic, placed on oxygen via nasal cannula is poor historian due to encephalopathy/confusion, ER work-up noted for mild tachycardia, hypotension with blood pressure in 70s, O2 saturation in the 80s, creatinine 3.7 with baseline 1.5, hemoglobin 7, platelet count 84, troponin 9.3, BNP greater than 2200, ED attending did discuss case with Dr. Rockey Situ who recommended heparin drip for non-STEMI, code sepsis initiated in the emergency  room, chest x-ray noted for questionable pneumonia, patient evaluated in the emergency room, significant other is at the bedside, patient is now being admitted for acute non-STEMI, acute kidney injury with chronic kidney disease stage III, acute on chronic worsening symptomatic anemia, and ?  Sepsis.  HOSPITAL COURSE:   She was admitted to the medical intensive care unit.  She was treated for severe sepsis with volume resuscitation and broad-spectrum antibiotics.  She was also transfused 2 units PRBCs for hemoglobin 7.0 on admission. She was seen in consultation by critical care medicine service.  She was supported with norepinephrine to maintain MAP goal >65 mmHg.    She was seen in consultation by cardiology.  It was felt that the elevated troponin represented demand ischemia.  It was noted that she underwent LHC in 09/2018 which revealed severe diffuse disease.  There was no plan for intervention at this time.    On the day of admission, blood cultures turned positive for methicillin sensitive staph aureus.  She was seen in consultation by infectious disease service and antibiotics were changed to nafcillin.  It was recommended that the AICD be removed given recurrent/relapsing bacteremia.  This is a procedure that cannot be done at this hospital and therefore transfer to [facility] has been arranged.  She was seen in consultation by nephrology service.  She did not require dialysis.  At the time of transfer, her creatinine is slightly improved at 3.6 (peak Cr 3.93 on 02/17) and her urine output has improved somewhat (485 cc/24hr).  At the time of this dictation, she is in no distress and is comfortable on nasal cannula oxygen.  She is hemodynamically stable on a minimal dose of norepinephrine.  Her exam is notable only for faint bibasilar crackles, 2/6 systolic murmur and mild symmetric ankle and pedal edema.  Echocardiogram 11/23/18:   1. The  left ventricle has mild-moderately reduced systolic  function, with an ejection fraction of 40-45%. The cavity size was mildly dilated. Left ventricular diastolic Doppler parameters are consistent with pseudonormalization.  2. The right ventricle has normal systolic function. The cavity was normal. There is no increase in right ventricular wall thickness.  3. Left atrial size was mildly dilated.  4. Right atrial size was mildly dilated.  5. The mitral valve is normal in structure.  6. The tricuspid valve is normal in structure.  7. The aortic valve is normal in structure.  8. The pulmonic valve was normal in structure.  CXR: Biventricular pacer from the left in stable position. Right IJ line with tip at the upper cavoatrial junction. Stable borderline heart size. Stable asymmetric hazy right perihilar opacity on a background of generalized interstitial coarsening. No visible effusion. No pneumothorax.    Results for orders placed or performed during the hospital encounter of 11/22/18  Blood culture (routine x 2)     Status: Abnormal   Collection Time: 11/22/18 11:16 AM  Result Value Ref Range Status   Specimen Description   Final    BLOOD BLOOD LEFT HAND Performed at [facility], [address] [place], [place] [address]    Special Requests   Final    BOTTLES DRAWN AEROBIC AND ANAEROBIC Blood Culture adequate volume Performed at [facility], [address], [place], [place] [address]    Culture  Setup Time   Final    GRAM POSITIVE COCCI IN BOTH AEROBIC AND ANAEROBIC BOTTLES CRITICAL RESULT CALLED TO, READ BACK BY AND VERIFIED WITH: LISA KLUTTZ 11/22/18 @ 1837  [place] Performed at [facility], [address] [place], [place] [address]    Culture STAPHYLOCOCCUS AUREUS (A)  Final   Report Status 11/24/2018 FINAL  Final   Organism ID, Bacteria STAPHYLOCOCCUS AUREUS  Final      Susceptibility   Staphylococcus aureus - MIC*    CIPROFLOXACIN 1 SENSITIVE Sensitive     ERYTHROMYCIN <=0.25 SENSITIVE Sensitive      GENTAMICIN <=0.5 SENSITIVE Sensitive     OXACILLIN <=0.25 SENSITIVE Sensitive     TETRACYCLINE <=1 SENSITIVE Sensitive     VANCOMYCIN <=0.5 SENSITIVE Sensitive     TRIMETH/SULFA <=10 SENSITIVE Sensitive     CLINDAMYCIN <=0.25 SENSITIVE Sensitive     RIFAMPIN <=0.5 SENSITIVE Sensitive     Inducible Clindamycin NEGATIVE Sensitive     * STAPHYLOCOCCUS AUREUS  Blood Culture ID Panel (Reflexed)     Status: Abnormal   Collection Time: 11/22/18 11:16 AM  Result Value Ref Range Status   Enterococcus species NOT DETECTED NOT DETECTED Final   Listeria monocytogenes NOT DETECTED NOT DETECTED Final   Staphylococcus species DETECTED (A) NOT DETECTED Final    Comment: CRITICAL RESULT CALLED TO, READ BACK BY AND VERIFIED WITH: LISA KLUTTZ 11/22/18 @ 1837  MLK    Staphylococcus aureus (BCID) DETECTED (A) NOT DETECTED Final    Comment: Methicillin (oxacillin) susceptible Staphylococcus aureus (MSSA). Preferred therapy is anti staphylococcal beta lactam antibiotic (Cefazolin or Nafcillin), unless clinically contraindicated. CRITICAL RESULT CALLED TO, READ BACK BY AND VERIFIED WITH: LISA KLUTTZ 11/22/18 @ 1837  MLK    Methicillin resistance NOT DETECTED NOT DETECTED Final   Streptococcus species NOT DETECTED NOT DETECTED Final   Streptococcus agalactiae NOT DETECTED NOT DETECTED Final   Streptococcus pneumoniae NOT DETECTED NOT DETECTED Final   Streptococcus pyogenes NOT DETECTED NOT DETECTED Final   Acinetobacter baumannii NOT DETECTED NOT DETECTED Final  Enterobacteriaceae species NOT DETECTED NOT DETECTED Final   Enterobacter cloacae complex NOT DETECTED NOT DETECTED Final   Escherichia coli NOT DETECTED NOT DETECTED Final   Klebsiella oxytoca NOT DETECTED NOT DETECTED Final   Klebsiella pneumoniae NOT DETECTED NOT DETECTED Final   Proteus species NOT DETECTED NOT DETECTED Final   Serratia marcescens NOT DETECTED NOT DETECTED Final   Haemophilus influenzae NOT DETECTED NOT DETECTED Final    Neisseria meningitidis NOT DETECTED NOT DETECTED Final   Pseudomonas aeruginosa NOT DETECTED NOT DETECTED Final   Candida albicans NOT DETECTED NOT DETECTED Final   Candida glabrata NOT DETECTED NOT DETECTED Final   Candida krusei NOT DETECTED NOT DETECTED Final   Candida parapsilosis NOT DETECTED NOT DETECTED Final   Candida tropicalis NOT DETECTED NOT DETECTED Final    Comment: Performed at [facility], [address] [place], [place] [address]  MRSA PCR Screening     Status: None   Collection Time: 11/22/18  3:44 PM  Result Value Ref Range Status   MRSA by PCR NEGATIVE NEGATIVE Final    Comment:        The GeneXpert MRSA Assay (FDA approved for NASAL specimens only), is one component of a comprehensive MRSA colonization surveillance program. It is not intended to diagnose MRSA infection nor to guide or monitor treatment for MRSA infections. Performed at [facility], [address] [place], [place] [address]   Blood culture (routine x 2)     Status: Abnormal   Collection Time: 11/22/18  6:31 PM  Result Value Ref Range Status   Specimen Description   Final    BLOOD RIGHT HAND Performed at [facility], [place] [address] [place], [place] [address]    Special Requests   Final    BOTTLES DRAWN AEROBIC ONLY Blood Culture results may not be optimal due to an inadequate volume of blood received in culture bottles Performed at [facility], [address] [place], [place] [address]    Culture  Setup Time   Final    GRAM POSITIVE COCCI AEROBIC BOTTLE ONLY CRITICAL RESULT CALLED TO, READ BACK BY AND VERIFIED WITH: [facility] KATSOUDAS AT 7989 11/23/18 [place] Performed at [facility], [address] [place], [place] [address]    Culture (A)  Final    STAPHYLOCOCCUS AUREUS SUSCEPTIBILITIES PERFORMED ON PREVIOUS CULTURE WITHIN THE LAST 5 DAYS. Performed at [facility], [place] [address], [place], [place] [address]    Report Status  11/24/2018 FINAL  Final    Anti-infectives (From admission, onward)   Start     Dose/Rate Route Frequency Ordered Stop   11/23/18 1600  nafcillin 2 g in sodium chloride 0.9 % 100 mL IVPB     2 g 200 mL/hr over 30 Minutes Intravenous Every 4 hours 11/23/18 1417     11/23/18 1000  ceFAZolin (ANCEF) IVPB 2g/100 mL premix  Status:  Discontinued     2 g 200 mL/hr over 30 Minutes Intravenous Every 12 hours 11/22/18 1946 11/22/18 2142   11/23/18 0715  vancomycin (VANCOCIN) 2,000 mg in sodium chloride 0.9 % 500 mL IVPB  Status:  Discontinued     2,000 mg 250 mL/hr over 120 Minutes Intravenous  Once 11/23/18 0706 11/23/18 0707   11/22/18 2141  vancomycin variable dose per unstable renal function (pharmacist dosing)  Status:  Discontinued      Does not apply See admin instructions 11/22/18 2142 11/23/18 1417   11/22/18 1800  ceFEPIme (MAXIPIME)  1 g in sodium chloride 0.9 % 100 mL IVPB  Status:  Discontinued     1 g 200 mL/hr over 30 Minutes Intravenous Every 12 hours 11/22/18 1518 11/22/18 1946   11/22/18 1530  vancomycin (VANCOCIN) IVPB 1000 mg/200 mL premix     1,000 mg 200 mL/hr over 60 Minutes Intravenous  Once 11/22/18 1518 11/22/18 1835   11/22/18 1518  vancomycin variable dose per unstable renal function (pharmacist dosing)  Status:  Discontinued      Does not apply See admin instructions 11/22/18 1518 11/22/18 1946   11/22/18 1515  ceFEPIme (MAXIPIME) 1 g in sodium chloride 0.9 % 100 mL IVPB  Status:  Discontinued     1 g 200 mL/hr over 30 Minutes Intravenous Every 8 hours 11/22/18 1513 11/22/18 1518   11/22/18 1200  piperacillin-tazobactam (ZOSYN) IVPB 3.375 g     3.375 g 100 mL/hr over 30 Minutes Intravenous  Once 11/22/18 1150 11/22/18 1231   11/22/18 1200  vancomycin (VANCOCIN) IVPB 1000 mg/200 mL premix     1,000 mg 200 mL/hr over 60 Minutes Intravenous  Once 11/22/18 1150 11/22/18 1306       BMP Latest Ref Rng & Units 11/24/2018 11/23/2018 11/22/2018  Glucose 70 - 99 mg/dL  211(H) 284(H) 251(H)  BUN 8 - 23 mg/dL 69(H) 65(H) 58(H)  Creatinine 0.44 - 1.00 mg/dL 3.60(H) 3.96(H) 3.73(H)  BUN/Creat Ratio 12 - 28 - - -  Sodium 135 - 145 mmol/L 132(L) 135 132(L)  Potassium 3.5 - 5.1 mmol/L 3.5 3.8 4.1  Chloride 98 - 111 mmol/L 101 99 93(L)  CO2 22 - 32 mmol/L 22 24 27   Calcium 8.9 - 10.3 mg/dL 7.0(L) 7.2(L) 7.5(L)   CBC Latest Ref Rng & Units 11/24/2018 11/23/2018 11/22/2018  WBC 4.0 - 10.5 K/uL 7.1 7.4 -  Hemoglobin 12.0 - 15.0 g/dL 9.1(L) 9.1(L) 9.4(L)  Hematocrit 36.0 - 46.0 % 29.0(L) 29.4(L) 30.0(L)  Platelets 150 - 400 K/uL 97(L) 101(L) -   Hepatic Function Latest Ref Rng & Units 11/23/2018 11/22/2018 10/22/2018  Total Protein 6.5 - 8.1 g/dL 5.9(L) 6.6 6.5  Albumin 3.5 - 5.0 g/dL 2.5(L) 2.9(L) 2.8(L)  AST 15 - 41 U/L 105(H) 197(H) 21  ALT 0 - 44 U/L 28 33 <5  Alk Phosphatase 38 - 126 U/L 44 43 67  Total Bilirubin 0.3 - 1.2 mg/dL 0.7 0.8 <0.2   Cardiac Panel (last 3 results) Recent Labs    11/22/18 1116  TROPONINI 9.30*     Merton Border, MD PCCM service Mobile [phone] Pager [phone] 11/24/2018 11:38 AM

## 2018-11-24 NOTE — Progress Notes (Signed)
Date of Admission:  11/22/2018    Today 11/24/18  ID: [name] is a 63 y.o. female  Active Problems:   NSTEMI (non-ST elevated myocardial infarction) ([place]) MSSA bacteremia CIED infection  Subjective: Feeling better No fever or chills or confusion  Medications:  . aspirin EC  81 mg Oral Daily  . clopidogrel  75 mg Oral Daily  . ferrous sulfate  325 mg Oral Daily  . heparin injection (subcutaneous)  5,000 Units Subcutaneous Q8H  . insulin aspart  0-15 Units Subcutaneous TID WC  . insulin aspart  0-5 Units Subcutaneous QHS  . insulin glargine  16 Units Subcutaneous QHS  . mouth rinse  15 mL Mouth Rinse BID  . omega-3 acid ethyl esters  1 g Oral BID  . rosuvastatin  40 mg Oral QHS  . sodium chloride flush  10-40 mL Intracatheter Q12H    Objective: Vital signs in last 24 hours: Temp:  [98.4 F (36.9 C)-98.9 F (37.2 C)] 98.7 F (37.1 C) (02/18 0822) Pulse Rate:  [69-79] 78 (02/18 0822) Resp:  [13-25] 23 (02/18 0822) BP: (74-135)/(34-63) 135/63 (02/18 0800) SpO2:  [90 %-97 %] 90 % (02/18 0822) Weight:  [131.2 kg] 131.2 kg (02/18 0500)  PHYSICAL EXAM:  General: Alert, cooperative, no distress, pale  Head: Normocephalic, without obvious abnormality, atraumatic. Eyes: Conjunctivae clear, anicteric sclerae. Pupils are equal ENT Nares normal. No drainage or sinus tenderness. Lips, mucosa, and tongue normal. No Thrush Neck: Supple, symmetrical, no adenopathy, thyroid: non tender no carotid bruit and no JVD. Back: No CVA tenderness. Lungs: b/la ir entry- few basal crepts Heart: s1s2. ICD site with no erythema or tenderness Abdomen: Soft, non-tender,not distended. Bowel sounds normal. No masses Extremities:b/l 2nd toe amputation- Skin: No rashes or lesions. Or bruising Lymph: Cervical, supraclavicular normal. Neurologic: Grossly non-focal  Lab Results Recent Labs    11/23/18 0526 11/24/18 0519  WBC 7.4 7.1  HGB 9.1* 9.1*  HCT 29.4* 29.0*  NA 135 132*  K  3.8 3.5  CL 99 101  CO2 24 22  BUN 65* 69*  CREATININE 3.96* 3.60*   Liver Panel Recent Labs    11/22/18 1116 11/23/18 0526  PROT 6.6 5.9*  ALBUMIN 2.9* 2.5*  AST 197* 105*  ALT 33 28  ALKPHOS 43 44  BILITOT 0.8 0.7   Microbiology: 2/16 [facility] MSSA   Studies/Results: Dg Chest Port 1 View  Result Date: 11/24/2018 CLINICAL DATA:  Respiratory failure EXAM: PORTABLE CHEST 1 VIEW COMPARISON:  Two days ago FINDINGS: Biventricular pacer from the left in stable position. Right IJ line with tip at the upper cavoatrial junction. Stable borderline heart size. Stable asymmetric hazy right perihilar opacity on a background of generalized interstitial coarsening. No visible effusion. No pneumothorax. History bacteremia IMPRESSION: Right perihilar infiltrate or atelectasis remains stable. Electronically Signed   By: Monte Fantasia M.D.   On: 11/24/2018 07:56   Dg Chest Port 1 View  Result Date: 11/22/2018 CLINICAL DATA:  Central line placement EXAM: PORTABLE CHEST 1 VIEW COMPARISON:  November 22, 2018 FINDINGS: The new right central line terminates near the caval atrial junction. No pneumothorax. Right perihilar opacity is a little less conspicuous but remains. Mild increased interstitial opacities suggest pulmonary venous congestion. The cardiomediastinal silhouette is stable. No other change. IMPRESSION: 1. The new right central line terminates near the caval atrial junction in good position with no pneumothorax. 2. Probable pulmonary venous congestion/mild edema. 3. Persistent but slightly less prominent right perihilar opacity. Recommend follow-up to  resolution. Electronically Signed   By: Dorise Bullion III M.D   On: 11/22/2018 18:38   Dg Chest Portable 1 View  Result Date: 11/22/2018 CLINICAL DATA:  Altered mental status. Treated for pneumonia 2 weeks ago. EXAM: PORTABLE CHEST 1 VIEW COMPARISON:  Radiographs 10/30/2018 and 10/27/2018. FINDINGS: 1121 hours. Two views submitted. Right arm PICC  extends to the superior cavoatrial junction. Left subclavian defibrillator leads appear grossly unchanged within the right atrium, right ventricle and coronary sinus. There is stable mild cardiomegaly and aortic atherosclerosis. Pulmonary edema has improved. There is stable patchy opacity at the right lung base. Veiling opacity in the right perihilar region has increased, suspicious for fluid in the fissure, less likely consolidation in the superior segment of the lower lobe. IMPRESSION: 1. Interval increased right perihilar opacity, probably fluid in the fissure versus consolidation in the superior segment of the lower lobe. 2. Patchy right basilar airspace disease is unchanged. No definite residual edema. Electronically Signed   By: Richardean Sale M.D.   On: 11/22/2018 12:22     Assessment/Plan:  63 y.o. female with a history ofrecently treated MSSA bacteremia and osteomyelitis of the 2nd left toe with 6 weeks of Iv cefazolin and amputation of the toe, Diabetes mellitus, coronary artery disease, cardiomyopathy, CHF AICD,   is admitted with altered mental status   Recurrent/ Staph aureus bacteremia   - was treated with 6 weeks of IV cefazolin Jan/Feb .[place] the first time the source was an infected 2nd toe left foot and it was amputated on 10/08/18 and cultures grew MSSA and pathology showed acute osteomyelitis. ICD was not removed as she had a clear source for the bacteremia and there was no pocket infection or endocarditis or lead vegetation on TEE. Blood culture from 12/24, 1/7 and 2/11 were neg ( all on antibiotics) She was followed by me and cardiologist as OP and the plan was to monitor her with weekly blood culture for 3 weeks  after completion of antibiotic Her last day of antibiotic was 2/13 and she now has MSSA in blood from 11/22/18-  Now the AICD will have to be removed-- She will need repeat TEE to make sure there is no valve  Vegetation Repeat culture until clear of bacteremia  Seen  by  Dr.Klein-plan to transfer to [facility] for removal of device ? Thrombocytopenia likely due to sepsis - doubt due to cefazolin ( had normal platelet on week 6 of cefazolin- on 11/10/18 Currently on nafcillin Will need for atleast 4 weeks  AKI on CKD,,improving   could be due to sepsis, diuresis doubt AIN due to cephalosporin   Hypoxemia secondary to sepsis with underlying CHF  Recent Acute osteomyelitis of the left 2nd toe- treated  with surgery and IV antibiotic -healed completely  CHF-management as per cardiologist   Discussed the management with patient and the care team

## 2018-11-25 DIAGNOSIS — Z89421 Acquired absence of other right toe(s): Secondary | ICD-10-CM

## 2018-11-25 DIAGNOSIS — R000 Tachycardia, unspecified: Secondary | ICD-10-CM

## 2018-11-25 DIAGNOSIS — I499 Cardiac arrhythmia, unspecified: Secondary | ICD-10-CM

## 2018-11-25 LAB — BASIC METABOLIC PANEL
ANION GAP: 11 (ref 5–15)
BUN: 70 mg/dL — ABNORMAL HIGH (ref 8–23)
CO2: 21 mmol/L — ABNORMAL LOW (ref 22–32)
Calcium: 7.1 mg/dL — ABNORMAL LOW (ref 8.9–10.3)
Chloride: 101 mmol/L (ref 98–111)
Creatinine, Ser: 3.54 mg/dL — ABNORMAL HIGH (ref 0.44–1.00)
GFR calc Af Amer: 15 mL/min — ABNORMAL LOW (ref >60)
GFR calc non Af Amer: 13 mL/min — ABNORMAL LOW (ref >60)
GLUCOSE: 103 mg/dL — AB (ref 70–99)
Potassium: 3.5 mmol/L (ref 3.5–5.1)
Sodium: 133 mmol/L — ABNORMAL LOW (ref 135–145)

## 2018-11-25 LAB — CBC
HCT: 27.4 % — ABNORMAL LOW (ref 36.0–46.0)
Hemoglobin: 8.8 g/dL — ABNORMAL LOW (ref 12.0–15.0)
MCH: 27.9 pg (ref 26.0–34.0)
MCHC: 32.1 g/dL (ref 30.0–36.0)
MCV: 87 fL (ref 80.0–100.0)
Platelets: 84 10*3/uL — ABNORMAL LOW (ref 150–400)
RBC: 3.15 MIL/uL — ABNORMAL LOW (ref 3.87–5.11)
RDW: 15.4 % (ref 11.5–15.5)
WBC: 7.3 10*3/uL (ref 4.0–10.5)
nRBC: 0 % (ref 0.0–0.2)

## 2018-11-25 LAB — GLUCOSE, CAPILLARY
GLUCOSE-CAPILLARY: 68 mg/dL — AB (ref 70–99)
Glucose-Capillary: 93 mg/dL (ref 70–99)
Glucose-Capillary: 78 mg/dL (ref 70–99)
Glucose-Capillary: 98 mg/dL (ref 70–99)

## 2018-11-25 MED ORDER — PNEUMOCOCCAL VAC POLYVALENT 25 MCG/0.5ML IJ INJ: 0.5000 mL | INJECTION | INTRAMUSCULAR | Status: DC | PRN

## 2018-11-25 NOTE — Progress Notes (Signed)
[place] at [place] NAME: [name]    MR#:  [id number]  DATE OF BIRTH:  1956/02/27  Patient is still waiting for bed at [facility].  CHIEF COMPLAINT:   Chief Complaint  Patient presents with  . Altered Mental Status    REVIEW OF SYSTEMS:    Review of Systems  Constitutional: Negative for chills and fever.  HENT: Negative for hearing loss.   Eyes: Negative for blurred vision, double vision and photophobia.  Respiratory: Negative for cough, hemoptysis and shortness of breath.   Cardiovascular: Negative for palpitations, orthopnea and leg swelling.  Gastrointestinal: Negative for abdominal pain, diarrhea and vomiting.  Genitourinary: Negative for dysuria and urgency.  Musculoskeletal: Negative for myalgias and neck pain.  Skin: Negative for rash.  Neurological: Negative for dizziness, focal weakness, seizures, weakness and headaches.  Psychiatric/Behavioral: Negative for memory loss. The patient does not have insomnia.     Nutrition:  Tolerating Diet: Tolerating PT:      DRUG ALLERGIES:  No Known Allergies  VITALS:  Blood pressure (!) 113/52, pulse 75, temperature 97.6 F (36.4 C), temperature source Axillary, resp. rate (!) 22, height 5\' 8"  (1.727 m), weight 130.5 kg, SpO2 97 %.  PHYSICAL EXAMINATION:   Physical Exam  GENERAL:  63 y.o.-year-old patient lying in the bed with no acute distress.  Appears dehydrated EYES: Pupils equal, round, reactive to light . No scleral icterus. Extraocular muscles intact.  HEENT: Head atraumatic, normocephalic. Oropharynx and nasopharynx clear.  Patient mucosa is dry. NECK:  Supple, no jugular venous distention. No thyroid enlargement, no tenderness.  LUNGS: Diminished air entry bilaterally.  CARDIOVASCULAR: S1 S1, S2 regular, patient has systolic murmur grade 2/6, diastolic murmur with a grade 2/6 present   aBDOMEN: Soft, nontender, nondistended. Bowel sounds present. No  organomegaly or mass.  EXTREMITIES: No pedal edema, cyanosis, or clubbing.  NEUROLOGIC:  lethargic, unable to follow commands for full neuro exam this morning. PSYCHIATRIC: The patient is alert and oriented x 3.  SKIN: No obvious rash, lesion, or ulcer.    LABORATORY PANEL:   CBC Recent Labs  Lab 11/25/18 0523  WBC 7.3  HGB 8.8*  HCT 27.4*  PLT 84*   ------------------------------------------------------------------------------------------------------------------  Chemistries  Recent Labs  Lab 11/23/18 0526  11/25/18 0523  NA 135   < > 133*  K 3.8   < > 3.5  CL 99   < > 101  CO2 24   < > 21*  GLUCOSE 284*   < > 103*  BUN 65*   < > 70*  CREATININE 3.96*   < > 3.54*  CALCIUM 7.2*   < > 7.1*  MG 1.3*  --   --   AST 105*  --   --   ALT 28  --   --   ALKPHOS 44  --   --   BILITOT 0.7  --   --    < > = values in this interval not displayed.   ------------------------------------------------------------------------------------------------------------------  Cardiac Enzymes Recent Labs  Lab 11/22/18 1116  TROPONINI 9.30*   ------------------------------------------------------------------------------------------------------------------  RADIOLOGY:  Dg Chest Port 1 View  Result Date: 11/24/2018 CLINICAL DATA:  Respiratory failure EXAM: PORTABLE CHEST 1 VIEW COMPARISON:  Two days ago FINDINGS: Biventricular pacer from the left in stable position. Right IJ line with tip at the upper cavoatrial junction. Stable borderline heart size. Stable asymmetric hazy right perihilar opacity on a background of generalized interstitial coarsening. No visible effusion.  No pneumothorax. History bacteremia IMPRESSION: Right perihilar infiltrate or atelectasis remains stable. Electronically Signed   By: Monte Fantasia M.D.   On: 11/24/2018 07:56     ASSESSMENT AND PLAN:   Active Problems:   NSTEMI (non-ST elevated myocardial infarction) ([place])   #1 hypovolemic shock secondary to not  eating and drinking for several days, admitted to ICU for aggressive hydration, pressor support..    2. Recurrent Staphaureusbacteremia, patient is on nafcillin.  Patient has been accepted to [facility],  3.   septic shock secondary to bacteremia, patient had elevated procalcitonin up to 55, antibiotics were changed to nafcillin.    #4,  Acute kidney injury, from sepsis, seen by nephrology,, patient received fluids, now on pressors. 5.  Acute on chronic anemia of chronic k disease, received 1 unit of blood transfusion. 6.  Shock with multiorgan dysfunction with hypovolemia and hypoxia on admission, condition critical,. 7.  Elevated troponin s non-ST elevation MI: Continue aspirin, Plavix, statins  patient with severe CAD, continue supplemental oxygen,  9.  Recurrent MSSA bacteremia, on nafcillin, patient needs ICD removal, seen by EP physician Dr. Caryl Comes recommends transfer to tertiary facility, patient chose [facility], patient is accepted at [facility] under cardiology service, Dr. Enrigue Catena is accepting physician, patient will go to [facility] when the bed is available.  Patient will be transferred from ICU level of care to stepdown status.   #10 .diabetes mellitus type 2: Patient is on long-acting, short-acting insulin.  Continue both.    Condition critical, high risk for cardiac arrest.  Poor prognosis. All the records are reviewed and case discussed with Care Management/Social Workerr. Management plans discussed with the patient, family and they are in agreement.  CODE STATUS: Full code  TOTAL TIME TAKING CARE OF THIS PATIENT: 40 minutes.   Patient will go to [facility] when the bed is available.  Discharge summary, instructions, EMTALAare already filled.  Case discussed with cardiology, intensivist. Epifanio Lesches M.D on 11/25/2018 at 8:56 AM  Between 7am to 6pm - Pager - [phone]  After 6pm go to [email or web address] - password [email or web address] [facility]  Office   [phone]  CC: Primary care physician; Rutherford Guys, MD

## 2018-11-25 NOTE — Progress Notes (Signed)
Inpatient Diabetes Program Recommendations  AACE/ADA: New Consensus Statement on Inpatient Glycemic Control   Target Ranges:  Prepandial:   less than 140 mg/dL      Peak postprandial:   less than 180 mg/dL (1-2 hours)      Critically ill patients:  140 - 180 mg/dL   Results for TENA, LINEBAUGH (MRN [id number]) as of 11/25/2018 11:23  Ref. Range 11/25/2018 05:23  Glucose Latest Ref Range: 70 - 99 mg/dL 103 (H)   Results for RETIA, CORDLE (MRN [id number]) as of 11/25/2018 11:23  Ref. Range 11/24/2018 03:32 11/24/2018 11:40 11/24/2018 15:51 11/24/2018 19:29 11/24/2018 22:10  Glucose-Capillary Latest Ref Range: 70 - 99 mg/dL 209 (H) 194 (H) 216 (H) 189 (H) 171 (H)    Review of Glycemic Control  Diabetes history: DM2 Outpatient Diabetes medications: Amaryl 4 mg BID, Lantus 16 units QHS Current orders for Inpatient glycemic control: Lantus 16 units QHS, Novolog 0-15 units TID with meals, Novolog 0-5 units QHS  Inpatient Diabetes Program Recommendations:   Insulin - Meal Coverage: Please consider ordering Novolog 3 units TID with meals for meal coverage if patient eats at least 50% of meals.  Thanks, Barnie Alderman, RN, MSN, CDE Diabetes Coordinator Inpatient Diabetes Program [phone] (Team Pager from 8am to 5pm)

## 2018-11-25 NOTE — Progress Notes (Signed)
[place], [place] 11/25/18  Subjective:   Patient is critically ill.  Serum creatinine remains elevated, although slight improvement noted again from yesterday to 3.60.  Urine output 375 cc. Patient has foley Able to eat without nausea or vomiting  Objective:  Vital signs in last 24 hours:  Temp:  [97.6 F (36.4 C)-98.6 F (37 C)] 98.6 F (37 C) (02/19 0800) Pulse Rate:  [68-80] 80 (02/19 0800) Resp:  [12-24] 20 (02/19 0800) BP: (93-119)/(41-81) 99/81 (02/19 0800) SpO2:  [90 %-99 %] 97 % (02/19 0800) Weight:  [130.5 kg] 130.5 kg (02/19 0500)  Weight change: -0.7 kg Filed Weights   11/22/18 1552 11/24/18 0500 11/25/18 0500  Weight: 128.4 kg 131.2 kg 130.5 kg    Intake/Output:    Intake/Output Summary (Last 24 hours) at 11/25/2018 1002 Last data filed at 11/25/2018 0900 Gross per 24 hour  Intake 703.45 ml  Output 625 ml  Net 78.45 ml     Physical Exam: General:  Critically ill-appearing, laying in the bed  HEENT  dry oral mucous membranes  Neck  supple  Pulm/lungs  coarse breath sounds bilaterally  CVS/Heart  regular rhythm  Abdomen:   Soft, nontender  Extremities:  Trace edema  Neurologic:  Alert, able to answer questions  Foley in place  Basic Metabolic Panel:  Recent Labs  Lab 11/22/18 1116 11/23/18 0526 11/24/18 0519 11/25/18 0523  NA 132* 135 132* 133*  K 4.1 3.8 3.5 3.5  CL 93* 99 101 101  CO2 27 24 22  21*  GLUCOSE 251* 284* 211* 103*  BUN 58* 65* 69* 70*  CREATININE 3.73* 3.96* 3.60* 3.54*  CALCIUM 7.5* 7.2* 7.0* 7.1*  MG  --  1.3*  --   --   PHOS  --  4.4  --   --      CBC: Recent Labs  Lab 11/22/18 1116 11/22/18 1850 11/23/18 0526 11/24/18 0519 11/25/18 0523  WBC 4.8  --  7.4 7.1 7.3  NEUTROABS 4.1  --   --   --   --   HGB 7.0* 9.4* 9.1* 9.1* 8.8*  HCT 22.4* 30.0* 29.4* 29.0* 27.4*  MCV 91.8  --  88.8 89.8 87.0  PLT 84*  --  101* 97* 84*     No results found for: HEPBSAG, HEPBSAB,  HEPBIGM    Microbiology:  Recent Results (from the past 240 hour(s))  Blood culture (routine x 2)     Status: Abnormal   Collection Time: 11/22/18 11:16 AM  Result Value Ref Range Status   Specimen Description   Final    BLOOD BLOOD LEFT HAND Performed at [facility], [address], [place], [place] [address]    Special Requests   Final    BOTTLES DRAWN AEROBIC AND ANAEROBIC Blood Culture adequate volume Performed at [facility], [address] [place], [place] [address]    Culture  Setup Time   Final    GRAM POSITIVE COCCI IN BOTH AEROBIC AND ANAEROBIC BOTTLES CRITICAL RESULT CALLED TO, READ BACK BY AND VERIFIED WITH: LISA [place] 11/22/18 @ 1837  [place] Performed at [facility], [address] [place], [place] [address]    Culture STAPHYLOCOCCUS AUREUS (A)  Final   Report Status 11/24/2018 FINAL  Final   Organism ID, Bacteria STAPHYLOCOCCUS AUREUS  Final      Susceptibility   Staphylococcus aureus - MIC*    CIPROFLOXACIN 1 SENSITIVE Sensitive     ERYTHROMYCIN <=0.25 SENSITIVE Sensitive  GENTAMICIN <=0.5 SENSITIVE Sensitive     OXACILLIN <=0.25 SENSITIVE Sensitive     TETRACYCLINE <=1 SENSITIVE Sensitive     VANCOMYCIN <=0.5 SENSITIVE Sensitive     TRIMETH/SULFA <=10 SENSITIVE Sensitive     CLINDAMYCIN <=0.25 SENSITIVE Sensitive     RIFAMPIN <=0.5 SENSITIVE Sensitive     Inducible Clindamycin NEGATIVE Sensitive     * STAPHYLOCOCCUS AUREUS  Blood Culture ID Panel (Reflexed)     Status: Abnormal   Collection Time: 11/22/18 11:16 AM  Result Value Ref Range Status   Enterococcus species NOT DETECTED NOT DETECTED Final   Listeria monocytogenes NOT DETECTED NOT DETECTED Final   Staphylococcus species DETECTED (A) NOT DETECTED Final    Comment: CRITICAL RESULT CALLED TO, READ BACK BY AND VERIFIED WITH: LISA KLUTTZ 11/22/18 @ 1837  MLK    Staphylococcus aureus (BCID) DETECTED (A) NOT DETECTED Final    Comment: Methicillin (oxacillin)  susceptible Staphylococcus aureus (MSSA). Preferred therapy is anti staphylococcal beta lactam antibiotic (Cefazolin or Nafcillin), unless clinically contraindicated. CRITICAL RESULT CALLED TO, READ BACK BY AND VERIFIED WITH: LISA KLUTTZ 11/22/18 @ 1837  MLK    Methicillin resistance NOT DETECTED NOT DETECTED Final   Streptococcus species NOT DETECTED NOT DETECTED Final   Streptococcus agalactiae NOT DETECTED NOT DETECTED Final   Streptococcus pneumoniae NOT DETECTED NOT DETECTED Final   Streptococcus pyogenes NOT DETECTED NOT DETECTED Final   Acinetobacter baumannii NOT DETECTED NOT DETECTED Final   Enterobacteriaceae species NOT DETECTED NOT DETECTED Final   Enterobacter cloacae complex NOT DETECTED NOT DETECTED Final   Escherichia coli NOT DETECTED NOT DETECTED Final   Klebsiella oxytoca NOT DETECTED NOT DETECTED Final   Klebsiella pneumoniae NOT DETECTED NOT DETECTED Final   Proteus species NOT DETECTED NOT DETECTED Final   Serratia marcescens NOT DETECTED NOT DETECTED Final   Haemophilus influenzae NOT DETECTED NOT DETECTED Final   Neisseria meningitidis NOT DETECTED NOT DETECTED Final   Pseudomonas aeruginosa NOT DETECTED NOT DETECTED Final   Candida albicans NOT DETECTED NOT DETECTED Final   Candida glabrata NOT DETECTED NOT DETECTED Final   Candida krusei NOT DETECTED NOT DETECTED Final   Candida parapsilosis NOT DETECTED NOT DETECTED Final   Candida tropicalis NOT DETECTED NOT DETECTED Final    Comment: Performed at [facility], [address] [place], [place] [address]  MRSA PCR Screening     Status: None   Collection Time: 11/22/18  3:44 PM  Result Value Ref Range Status   MRSA by PCR NEGATIVE NEGATIVE Final    Comment:        The GeneXpert MRSA Assay (FDA approved for NASAL specimens only), is one component of a comprehensive MRSA colonization surveillance program. It is not intended to diagnose MRSA infection nor to guide or monitor treatment for MRSA  infections. Performed at [facility], [address] [place], [place] [address]   Blood culture (routine x 2)     Status: Abnormal   Collection Time: 11/22/18  6:31 PM  Result Value Ref Range Status   Specimen Description   Final    BLOOD RIGHT HAND Performed at [facility], [address] [place], [place] [address]    Special Requests   Final    BOTTLES DRAWN AEROBIC ONLY Blood Culture results may not be optimal due to an inadequate volume of blood received in culture bottles Performed at [facility], [address], [place], [place] [address]    Culture  Setup Time  Final    GRAM POSITIVE COCCI AEROBIC BOTTLE ONLY CRITICAL RESULT CALLED TO, READ BACK BY AND VERIFIED WITHCurt Jews AT 3500 11/23/18 [place] Performed at [facility], [address] [place], [place] [address]    Culture (A)  Final    STAPHYLOCOCCUS AUREUS SUSCEPTIBILITIES PERFORMED ON PREVIOUS CULTURE WITHIN THE LAST 5 DAYS. Performed at [facility], [place] [address] [place], [place] [address]    Report Status 11/24/2018 FINAL  Final  CULTURE, BLOOD (ROUTINE X 2) w Reflex to ID Panel     Status: None (Preliminary result)   Collection Time: 11/25/18 12:12 AM  Result Value Ref Range Status   Specimen Description BLOOD LEFT ASSIST CONTROL  Final   Special Requests   Final    BOTTLES DRAWN AEROBIC AND ANAEROBIC Blood Culture adequate volume   Culture   Final    NO GROWTH < 12 HOURS Performed at [facility], [address], [place], [place] [address]    Report Status PENDING  Incomplete  CULTURE, BLOOD (ROUTINE X 2) w Reflex to ID Panel     Status: None (Preliminary result)   Collection Time: 11/25/18 12:14 AM  Result Value Ref Range Status   Specimen Description BLOOD LEFT FATTY CASTS  Final   Special Requests   Final    BOTTLES DRAWN AEROBIC AND ANAEROBIC Blood Culture adequate volume   Culture   Final    NO GROWTH < 12 HOURS Performed at  [facility], [address] [place], [place] [address]    Report Status PENDING  Incomplete    Coagulation Studies: Recent Labs    11/22/18 1116  LABPROT 17.9*  INR 1.50    Urinalysis: No results for input(s): COLORURINE, LABSPEC, PHURINE, GLUCOSEU, HGBUR, BILIRUBINUR, KETONESUR, PROTEINUR, UROBILINOGEN, NITRITE, LEUKOCYTESUR in the last 72 hours.  Invalid input(s): APPERANCEUR    Imaging: Dg Chest Port 1 View  Result Date: 11/24/2018 CLINICAL DATA:  Respiratory failure EXAM: PORTABLE CHEST 1 VIEW COMPARISON:  Two days ago FINDINGS: Biventricular pacer from the left in stable position. Right IJ line with tip at the upper cavoatrial junction. Stable borderline heart size. Stable asymmetric hazy right perihilar opacity on a background of generalized interstitial coarsening. No visible effusion. No pneumothorax. History bacteremia IMPRESSION: Right perihilar infiltrate or atelectasis remains stable. Electronically Signed   By: Monte Fantasia M.D.   On: 11/24/2018 07:56     Medications:   . nafcillin IV 200 mL/hr at 11/25/18 0859  . norepinephrine (LEVOPHED) Adult infusion 2 mcg/min (11/25/18 0859)   . aspirin EC  81 mg Oral Daily  . clopidogrel  75 mg Oral Daily  . ferrous sulfate  325 mg Oral Daily  . heparin injection (subcutaneous)  5,000 Units Subcutaneous Q8H  . insulin aspart  0-15 Units Subcutaneous TID WC  . insulin aspart  0-5 Units Subcutaneous QHS  . insulin glargine  16 Units Subcutaneous QHS  . mouth rinse  15 mL Mouth Rinse BID  . omega-3 acid ethyl esters  1 g Oral BID  . rosuvastatin  40 mg Oral QHS  . sodium chloride flush  10-40 mL Intracatheter Q12H   acetaminophen, albuterol, nitroGLYCERIN, ondansetron (ZOFRAN) IV, sodium chloride flush  Assessment/ Plan:  63 y.o. female insulin dependent diabetes mellitus type 2, diabetic neuropathy, hypertension, anemia, obstructive sleep apnea requiring CPAP, positive ANA, hyperlipidemia, systolic  congestive heart failure status post AICD placement, coronary artery disease status post stent is admitted for sepsis  1.  Acute  kidney injury Likely secondary to ATN from sepsis Currently has MSSA bacteremia from cultures drawn on February 16. Serum creatinine is critically elevated although small improvement noted today No acute indication for dialysis at present but we will continue to monitor closely Patient to be transferred to a tertiary care center for removal of AICD   LOS: [place] 2/19/202010:02 AM  [place], [place]  Note: This note was prepared with Dragon dictation. Any transcription errors are unintentional

## 2018-11-25 NOTE — Progress Notes (Signed)
Date of Admission:  11/22/2018    Today 11/25/18  ID: [name] is a 63 y.o. female  Active Problems:   NSTEMI (non-ST elevated myocardial infarction) ([place]) MSSA bacteremia CIED   Subjective: Awaiting transfer to [facility] for removal of ICD No fever Feeling better   Medications:  . aspirin EC  81 mg Oral Daily  . clopidogrel  75 mg Oral Daily  . ferrous sulfate  325 mg Oral Daily  . heparin injection (subcutaneous)  5,000 Units Subcutaneous Q8H  . insulin aspart  0-15 Units Subcutaneous TID WC  . insulin aspart  0-5 Units Subcutaneous QHS  . insulin glargine  16 Units Subcutaneous QHS  . mouth rinse  15 mL Mouth Rinse BID  . omega-3 acid ethyl esters  1 g Oral BID  . rosuvastatin  40 mg Oral QHS  . sodium chloride flush  10-40 mL Intracatheter Q12H    Objective: Vital signs in last 24 hours: Temp:  [97.6 F (36.4 C)-98.6 F (37 C)] 98.6 F (37 C) (02/19 0800) Pulse Rate:  [68-80] 80 (02/19 0800) Resp:  [12-24] 20 (02/19 0800) BP: (93-119)/(41-81) 99/81 (02/19 0800) SpO2:  [90 %-99 %] 97 % (02/19 0800) Weight:  [130.5 kg] 130.5 kg (02/19 0500)  PHYSICAL EXAM:  General: Alert, cooperative, no distress, appears stated age.  Lungs: b/l air entry crepts bases  Heart: irregular, tachycardia Abdomen: Soft, non-tender,not distended. Bowel sounds normal. No masses Extremities: 2nd left toe amputation 2nd rt toe partial amputation Skin: No rashes or lesions. Or bruising Lymph: Cervical, supraclavicular normal. Neurologic: Grossly non-focal  Lab Results Recent Labs    11/24/18 0519 11/25/18 0523  WBC 7.1 7.3  HGB 9.1* 8.8*  HCT 29.0* 27.4*  NA 132* 133*  K 3.5 3.5  CL 101 101  CO2 22 21*  BUN 69* 70*  CREATININE 3.60* 3.54*   Liver Panel Recent Labs    11/22/18 1116 11/23/18 0526  PROT 6.6 5.9*  ALBUMIN 2.9* 2.5*  AST 197* 105*  ALT 33 28  ALKPHOS 43 44  BILITOT 0.8 0.7   Sedimentation Rate No results for input(s): ESRSEDRATE in the last 72  hours. C-Reactive Protein No results for input(s): CRP in the last 72 hours.  Microbiology:  Studies/Results: Dg Chest Port 1 View  Result Date: 11/24/2018 CLINICAL DATA:  Respiratory failure EXAM: PORTABLE CHEST 1 VIEW COMPARISON:  Two days ago FINDINGS: Biventricular pacer from the left in stable position. Right IJ line with tip at the upper cavoatrial junction. Stable borderline heart size. Stable asymmetric hazy right perihilar opacity on a background of generalized interstitial coarsening. No visible effusion. No pneumothorax. History bacteremia IMPRESSION: Right perihilar infiltrate or atelectasis remains stable. Electronically Signed   By: Monte Fantasia M.D.   On: 11/24/2018 07:56     Assessment/Plan: 63 y.o.femalewith ahistory ofrecently treated MSSA bacteremia and osteomyelitis of the 2nd left toe with 6 weeks of Iv cefazolin and amputation of the toe, Diabetes mellitus, coronary artery disease, cardiomyopathy,CHFAICD,  is admitted with altered mental status   Recurrent/ Staph aureus bacteremia - was treated with 6 weeks of IV cefazolin Jan/Feb .[place] the first time the source was an infected 2nd toe left foot and it was amputated on 10/08/18 and cultures grew MSSA and pathology showed acute osteomyelitis. ICD was not removed as she had a clear source for the bacteremia and there was no pocket infection or endocarditis or lead vegetation on TEE. Blood culture from 12/24, 1/7 and 2/11 were neg (  all on antibiotics) She was followed by me and cardiologist as OP and the plan was to monitor her with weekly blood culture for 3 weeks after completion of antibiotic Her last day of antibiotic was 2/13 and she now has MSSA in blood from 11/22/18- repeat culture sent 11/25/18 Now the AICD will have to be removed--She will need repeat TEE to make sure there is no valve Vegetation   Seen by  [place] cardiology awaiting transfer to [facility] for removal of device ? Thrombocytopenia  likely due to sepsis - ??? cefazolin ( had normal platelet on week 6 of cefazolin- on 11/10/18 Currently on nafcillin Will need for atleast 4 weeks  AKI on CKD,,improving   Hypoxemia secondary to sepsis with underlying CHF  RecentAcute osteomyelitis of the left 2nd toe- treated with surgery and IV antibiotic -healed completely  CHF-management as per cardiologist   Discussed the management with patient and the care team

## 2018-11-25 NOTE — Progress Notes (Signed)
Patient continues to await transfer to [facility].  She is now off vasopressors.  PCCM is available as needed for any issues that might arise prior to her transfer.  Merton Border, MD PCCM service Mobile [phone] Pager [phone] 11/25/2018 3:26 PM

## 2018-11-26 ENCOUNTER — Inpatient Hospital Stay: Payer: PPO

## 2018-11-26 ENCOUNTER — Ambulatory Visit: Payer: PPO | Admitting: Physician Assistant

## 2018-11-26 ENCOUNTER — Encounter: Payer: Self-pay | Admitting: *Deleted

## 2018-11-26 LAB — CBC
HCT: 27.8 % — ABNORMAL LOW (ref 36.0–46.0)
Hemoglobin: 8.8 g/dL — ABNORMAL LOW (ref 12.0–15.0)
MCH: 27.8 pg (ref 26.0–34.0)
MCHC: 31.7 g/dL (ref 30.0–36.0)
MCV: 87.7 fL (ref 80.0–100.0)
Platelets: 90 10*3/uL — ABNORMAL LOW (ref 150–400)
RBC: 3.17 MIL/uL — AB (ref 3.87–5.11)
RDW: 15.9 % — ABNORMAL HIGH (ref 11.5–15.5)
WBC: 6.6 10*3/uL (ref 4.0–10.5)
nRBC: 0 % (ref 0.0–0.2)

## 2018-11-26 LAB — BASIC METABOLIC PANEL
Anion gap: 10 (ref 5–15)
BUN: 66 mg/dL — ABNORMAL HIGH (ref 8–23)
CO2: 20 mmol/L — ABNORMAL LOW (ref 22–32)
Calcium: 7.2 mg/dL — ABNORMAL LOW (ref 8.9–10.3)
Chloride: 103 mmol/L (ref 98–111)
Creatinine, Ser: 3.22 mg/dL — ABNORMAL HIGH (ref 0.44–1.00)
GFR calc Af Amer: 17 mL/min — ABNORMAL LOW (ref >60)
GFR calc non Af Amer: 14 mL/min — ABNORMAL LOW (ref >60)
GLUCOSE: 113 mg/dL — AB (ref 70–99)
Potassium: 3.5 mmol/L (ref 3.5–5.1)
Sodium: 133 mmol/L — ABNORMAL LOW (ref 135–145)

## 2018-11-26 LAB — GLUCOSE, CAPILLARY
Glucose-Capillary: 122 mg/dL — ABNORMAL HIGH (ref 70–99)
Glucose-Capillary: 124 mg/dL — ABNORMAL HIGH (ref 70–99)
Glucose-Capillary: 204 mg/dL — ABNORMAL HIGH (ref 70–99)

## 2018-11-26 LAB — PROCALCITONIN: Procalcitonin: 13.8 ng/mL

## 2018-11-26 MED ORDER — METOPROLOL TARTRATE 25 MG PO TABS
12.5000 mg | ORAL_TABLET | Freq: Two times a day (BID) | ORAL | Status: DC
  Administered 2018-11-26 – 2018-11-27 (×2): 12.5 mg via ORAL
  Filled 2018-11-26 (×2): qty 1

## 2018-11-26 MED ORDER — SODIUM CHLORIDE 0.9 % IV SOLN
INTRAVENOUS | Status: DC | PRN
  Administered 2018-11-26 – 2018-11-27 (×3): 500 mL via INTRAVENOUS

## 2018-11-26 NOTE — Progress Notes (Signed)
She has been off vasopressors for greater than 24 hours.  Her respiratory and cardiovascular status are stable.  She continues to await a bed at [facility]  I have placed order for transfer to [place] floor with cardiac monitoring.  After transfer, PCCM will sign off. Please call if we can be of further assistance   Merton Border, MD PCCM service Mobile [phone] Pager [phone] 11/26/2018 3:03 PM

## 2018-11-26 NOTE — Progress Notes (Signed)
MMSA bactermia CIED infection Awaiting [facility] transfer for removal of ICD AKI on CKD- improving Thrombocytopenia- stable continue nafcillin 'Repeat BC from 2/19  Neg so far

## 2018-11-26 NOTE — Progress Notes (Signed)
[facility]  Date: 11/26/18 Time: 6:34 PM  Patient's telemetry reviewed, showing predominantly ventricular pacing though episodes of wide complex tachycardia are noted.  Appearance more consistent with SVT with aberrancy than ventricular tachycardia.  Will start metoprolol tartrate 12.5 mg BID and have [employer] interrogate device tomorrow.  Patient continues to await transfer to [facility] for lead extraction.  Nelva Bush, MD [facility] Pager: [phone]

## 2018-11-26 NOTE — Care Management Note (Signed)
Case Management Note  Patient Details  Name: [name] MRN: [id number] Date of Birth: 06-08-1956  Subjective/Objective:       Patient admitted with NSTEMI requiring vasopressors and ICU monitoring.  Patient is now off pressors and has floor orders.   Patient is from home with husband, her daughter and her family also reside in the home.  Patient reports that she walks with a walker and also has a cane, bedside commode, shower chair, and wheelchair.  Patient reports that she just finished PT with [facility] [place] [facility] before being admitted.  Patient would like home health services at discharge to continue physical therapy so she can get to the point where she does not need the walker.  Choice of agency offered and she would like to stay with [facility].  Floydene Flock notified of upcoming referral for [facility].  PCP verified as Dr. Pamella Pert and she follows up every 3 months.  Pharmacy is [facility] and she has no trouble affording medications.  Patient has a CPAP but reports she needs new hoses, since she has not been using her CPAP she wears O2 at night 2L.  RNCM will cont to follow and arrange [place] at discharge. Doran Clay RN BSN [phone]             Action/Plan:   Expected Discharge Date:                  Expected Discharge Plan:  [place]  In-House Referral:     Discharge planning Services  CM Consult  Post Acute Care Choice:  Home Health Choice offered to:  Patient  DME Arranged:    DME Agency:     HH Arranged:    [place] Agency:  [place]  Status of Service:  In process, will continue to follow  If discussed at Long Length of Stay Meetings, dates discussed:    Additional Comments:  Shelbie Hutching, RN 11/26/2018, 11:12 AM

## 2018-11-26 NOTE — Progress Notes (Signed)
[place], [place] 11/26/18  Subjective:   Patient is critically ill.  Serum creatinine remains elevated, although slight improvement noted again from yesterday to 3.22.  Urine output 2600 cc.   Able to eat without nausea or vomiting  Objective:  Vital signs in last 24 hours:  Temp:  [98 F (36.7 C)-98.4 F (36.9 C)] 98.2 F (36.8 C) (02/20 1637) Pulse Rate:  [78-124] 89 (02/20 1637) Resp:  [0-25] 18 (02/20 1637) BP: (100-146)/(46-67) 141/62 (02/20 1637) SpO2:  [95 %-100 %] 99 % (02/20 1637) Weight:  [136.3 kg] 136.3 kg (02/20 0500)  Weight change: 5.8 kg Filed Weights   11/24/18 0500 11/25/18 0500 11/26/18 0500  Weight: 131.2 kg 130.5 kg (!) 136.3 kg    Intake/Output:    Intake/Output Summary (Last 24 hours) at 11/26/2018 1740 Last data filed at 11/26/2018 1500 Gross per 24 hour  Intake 1350.85 ml  Output 3000 ml  Net -1649.15 ml     Physical Exam: General:  Critically ill-appearing, laying in the bed  HEENT  moist oral mucous membranes  Neck  supple  Pulm/lungs  coarse breath sounds bilaterally  CVS/Heart  regular rhythm, paced  Abdomen:   Soft, nontender  Extremities:  Trace edema  Neurologic:  Alert, able to answer questions  Foley in place  Basic Metabolic Panel:  Recent Labs  Lab 11/22/18 1116 11/23/18 0526 11/24/18 0519 11/25/18 0523 11/26/18 0553  NA 132* 135 132* 133* 133*  K 4.1 3.8 3.5 3.5 3.5  CL 93* 99 101 101 103  CO2 27 24 22  21* 20*  GLUCOSE 251* 284* 211* 103* 113*  BUN 58* 65* 69* 70* 66*  CREATININE 3.73* 3.96* 3.60* 3.54* 3.22*  CALCIUM 7.5* 7.2* 7.0* 7.1* 7.2*  MG  --  1.3*  --   --   --   PHOS  --  4.4  --   --   --      CBC: Recent Labs  Lab 11/22/18 1116 11/22/18 1850 11/23/18 0526 11/24/18 0519 11/25/18 0523 11/26/18 0553  WBC 4.8  --  7.4 7.1 7.3 6.6  NEUTROABS 4.1  --   --   --   --   --   HGB 7.0* 9.4* 9.1* 9.1* 8.8* 8.8*  HCT 22.4* 30.0* 29.4* 29.0* 27.4* 27.8*  MCV 91.8  --   88.8 89.8 87.0 87.7  PLT 84*  --  101* 97* 84* 90*     No results found for: HEPBSAG, HEPBSAB, HEPBIGM    Microbiology:  Recent Results (from the past 240 hour(s))  Blood culture (routine x 2)     Status: Abnormal   Collection Time: 11/22/18 11:16 AM  Result Value Ref Range Status   Specimen Description   Final    BLOOD BLOOD LEFT HAND Performed at [facility], [address], [place], [place] [address]    Special Requests   Final    BOTTLES DRAWN AEROBIC AND ANAEROBIC Blood Culture adequate volume Performed at [facility], [address] [place], [place] [address]    Culture  Setup Time   Final    GRAM POSITIVE COCCI IN BOTH AEROBIC AND ANAEROBIC BOTTLES CRITICAL RESULT CALLED TO, READ BACK BY AND VERIFIED WITH: LISA Delphos 11/22/18 @ 1837  [place] Performed at [facility], [address], [place], [place] [address]    Culture STAPHYLOCOCCUS AUREUS (A)  Final   Report Status 11/24/2018 FINAL  Final   Organism ID, Bacteria STAPHYLOCOCCUS AUREUS  Final  Susceptibility   Staphylococcus aureus - MIC*    CIPROFLOXACIN 1 SENSITIVE Sensitive     ERYTHROMYCIN <=0.25 SENSITIVE Sensitive     GENTAMICIN <=0.5 SENSITIVE Sensitive     OXACILLIN <=0.25 SENSITIVE Sensitive     TETRACYCLINE <=1 SENSITIVE Sensitive     VANCOMYCIN <=0.5 SENSITIVE Sensitive     TRIMETH/SULFA <=10 SENSITIVE Sensitive     CLINDAMYCIN <=0.25 SENSITIVE Sensitive     RIFAMPIN <=0.5 SENSITIVE Sensitive     Inducible Clindamycin NEGATIVE Sensitive     * STAPHYLOCOCCUS AUREUS  Blood Culture ID Panel (Reflexed)     Status: Abnormal   Collection Time: 11/22/18 11:16 AM  Result Value Ref Range Status   Enterococcus species NOT DETECTED NOT DETECTED Final   Listeria monocytogenes NOT DETECTED NOT DETECTED Final   Staphylococcus species DETECTED (A) NOT DETECTED Final    Comment: CRITICAL RESULT CALLED TO, READ BACK BY AND VERIFIED WITH: LISA KLUTTZ 11/22/18 @ 1837  MLK     Staphylococcus aureus (BCID) DETECTED (A) NOT DETECTED Final    Comment: Methicillin (oxacillin) susceptible Staphylococcus aureus (MSSA). Preferred therapy is anti staphylococcal beta lactam antibiotic (Cefazolin or Nafcillin), unless clinically contraindicated. CRITICAL RESULT CALLED TO, READ BACK BY AND VERIFIED WITH: LISA KLUTTZ 11/22/18 @ 1837  MLK    Methicillin resistance NOT DETECTED NOT DETECTED Final   Streptococcus species NOT DETECTED NOT DETECTED Final   Streptococcus agalactiae NOT DETECTED NOT DETECTED Final   Streptococcus pneumoniae NOT DETECTED NOT DETECTED Final   Streptococcus pyogenes NOT DETECTED NOT DETECTED Final   Acinetobacter baumannii NOT DETECTED NOT DETECTED Final   Enterobacteriaceae species NOT DETECTED NOT DETECTED Final   Enterobacter cloacae complex NOT DETECTED NOT DETECTED Final   Escherichia coli NOT DETECTED NOT DETECTED Final   Klebsiella oxytoca NOT DETECTED NOT DETECTED Final   Klebsiella pneumoniae NOT DETECTED NOT DETECTED Final   Proteus species NOT DETECTED NOT DETECTED Final   Serratia marcescens NOT DETECTED NOT DETECTED Final   Haemophilus influenzae NOT DETECTED NOT DETECTED Final   Neisseria meningitidis NOT DETECTED NOT DETECTED Final   Pseudomonas aeruginosa NOT DETECTED NOT DETECTED Final   Candida albicans NOT DETECTED NOT DETECTED Final   Candida glabrata NOT DETECTED NOT DETECTED Final   Candida krusei NOT DETECTED NOT DETECTED Final   Candida parapsilosis NOT DETECTED NOT DETECTED Final   Candida tropicalis NOT DETECTED NOT DETECTED Final    Comment: Performed at [facility], [address] [place], [place] [address]  MRSA PCR Screening     Status: None   Collection Time: 11/22/18  3:44 PM  Result Value Ref Range Status   MRSA by PCR NEGATIVE NEGATIVE Final    Comment:        The GeneXpert MRSA Assay (FDA approved for NASAL specimens only), is one component of a comprehensive MRSA colonization surveillance  program. It is not intended to diagnose MRSA infection nor to guide or monitor treatment for MRSA infections. Performed at [facility] [place], [place] [address]   Blood culture (routine x 2)     Status: Abnormal   Collection Time: 11/22/18  6:31 PM  Result Value Ref Range Status   Specimen Description   Final    BLOOD RIGHT HAND Performed at [facility], [place] [address], [place], [place] [address]    Special Requests   Final    BOTTLES DRAWN AEROBIC ONLY Blood Culture results may not be optimal due to an inadequate volume of  blood received in culture bottles Performed at [facility], [address] [place], [place] [address]    Culture  Setup Time   Final    GRAM POSITIVE COCCI AEROBIC BOTTLE ONLY CRITICAL RESULT CALLED TO, READ BACK BY AND VERIFIED WITH: Curt Jews AT 2992 11/23/18 [place] Performed at [facility], [address] [place], [place] [address]    Culture (A)  Final    STAPHYLOCOCCUS AUREUS SUSCEPTIBILITIES PERFORMED ON PREVIOUS CULTURE WITHIN THE LAST 5 DAYS. Performed at [facility], [place] [address], [place], [place] [address]    Report Status 11/24/2018 FINAL  Final  CULTURE, BLOOD (ROUTINE X 2) w Reflex to ID Panel     Status: None (Preliminary result)   Collection Time: 11/25/18 12:12 AM  Result Value Ref Range Status   Specimen Description BLOOD LEFT ASSIST CONTROL  Final   Special Requests   Final    BOTTLES DRAWN AEROBIC AND ANAEROBIC Blood Culture adequate volume   Culture   Final    NO GROWTH 1 DAY Performed at [facility], [address], [place], [place] [address]    Report Status PENDING  Incomplete  CULTURE, BLOOD (ROUTINE X 2) w Reflex to ID Panel     Status: None (Preliminary result)   Collection Time: 11/25/18 12:14 AM  Result Value Ref Range Status   Specimen Description BLOOD LEFT FATTY CASTS  Final   Special Requests   Final    BOTTLES DRAWN AEROBIC AND  ANAEROBIC Blood Culture adequate volume   Culture   Final    NO GROWTH 1 DAY Performed at [facility], [address] [place], [place] [address]    Report Status PENDING  Incomplete    Coagulation Studies: No results for input(s): LABPROT, INR in the last 72 hours.  Urinalysis: No results for input(s): COLORURINE, LABSPEC, PHURINE, GLUCOSEU, HGBUR, BILIRUBINUR, KETONESUR, PROTEINUR, UROBILINOGEN, NITRITE, LEUKOCYTESUR in the last 72 hours.  Invalid input(s): APPERANCEUR    Imaging: Dg Chest Port 1 View  Result Date: 11/26/2018 CLINICAL DATA:  Respiratory failure EXAM: PORTABLE CHEST 1 VIEW COMPARISON:  Two days ago FINDINGS: Biventricular pacer leads from the left in unremarkable position. Right IJ line with tip at the SVC. Cardiomegaly. Low volumes and interstitial coarsening. Streaky density on the right. No pneumothorax. IMPRESSION: 1. Mild decrease in right perihilar opacity. 2. Lung volumes remain low. Electronically Signed   By: Monte Fantasia M.D.   On: 11/26/2018 07:24     Medications:   . nafcillin IV 2 g (11/26/18 1554)   . aspirin EC  81 mg Oral Daily  . clopidogrel  75 mg Oral Daily  . ferrous sulfate  325 mg Oral Daily  . heparin injection (subcutaneous)  5,000 Units Subcutaneous Q8H  . insulin aspart  0-15 Units Subcutaneous TID WC  . insulin aspart  0-5 Units Subcutaneous QHS  . insulin glargine  16 Units Subcutaneous QHS  . mouth rinse  15 mL Mouth Rinse BID  . omega-3 acid ethyl esters  1 g Oral BID  . rosuvastatin  40 mg Oral QHS  . sodium chloride flush  10-40 mL Intracatheter Q12H   acetaminophen, albuterol, nitroGLYCERIN, ondansetron (ZOFRAN) IV, pneumococcal 23 valent vaccine, sodium chloride flush  Assessment/ Plan:  63 y.o. female insulin dependent diabetes mellitus type 2, diabetic neuropathy, hypertension, anemia, obstructive sleep apnea requiring CPAP, positive ANA, hyperlipidemia, systolic congestive heart failure status post AICD  placement, coronary artery disease status post stent is admitted  for sepsis  1.  Acute kidney injury Likely secondary to ATN from sepsis, non oliguric now. UOP > 2600 Currently has MSSA bacteremia from cultures drawn on February 16. Serum creatinine is critically elevated although small incremental improvement noted daily  No acute indication for dialysis at present but we will continue to monitor closely Patient to be transferred to a tertiary care center for removal of AICD   LOS: [place] 2/20/20205:40 PM  [place], [place]  Note: This note was prepared with Dragon dictation. Any transcription errors are unintentional

## 2018-11-26 NOTE — Progress Notes (Addendum)
[place] at [place] NAME: [name]    MR#:  [id number]  DATE OF BIRTH:  01-26-1956  Patient is still waiting for bed at [facility].  CHIEF COMPLAINT:   Awaiting transfer to [facility] for removal of infected AICD  REVIEW OF SYSTEMS:    Review of Systems  Constitutional: Negative for chills and fever.  HENT: Negative for hearing loss.   Eyes: Negative for blurred vision, double vision and photophobia.  Respiratory: Negative for cough, hemoptysis and shortness of breath.   Cardiovascular: Negative for palpitations, orthopnea and leg swelling.  Gastrointestinal: Negative for abdominal pain, diarrhea and vomiting.  Genitourinary: Negative for dysuria and urgency.  Musculoskeletal: Negative for myalgias and neck pain.  Skin: Negative for rash.  Neurological: Negative for dizziness, focal weakness, seizures, weakness and headaches.  Psychiatric/Behavioral: Negative for memory loss. The patient does not have insomnia.     Nutrition:  Tolerating Diet: Tolerating PT:      DRUG ALLERGIES:  No Known Allergies  VITALS:  Blood pressure (!) 135/59, pulse 91, temperature 98 F (36.7 C), temperature source Oral, resp. rate (!) 22, height 5\' 8"  (1.727 m), weight (!) 136.3 kg, SpO2 100 %.  PHYSICAL EXAMINATION:   Physical Exam  GENERAL:  63 y.o.-year-old patient lying in the bed with no acute distress.  Appears dehydrated EYES: Pupils equal, round, reactive to light . No scleral icterus. Extraocular muscles intact.  HEENT: Head atraumatic, normocephalic. Oropharynx and nasopharynx clear.  Patient mucosa is dry. NECK:  Supple, no jugular venous distention. No thyroid enlargement, no tenderness.  LUNGS: Diminished air entry bilaterally.  CARDIOVASCULAR: S1 S1, S2 regular, patient has systolic murmur grade 2/6, diastolic murmur with a grade 2/6 present   aBDOMEN: Soft, nontender, nondistended. Bowel sounds present. No organomegaly or mass.   EXTREMITIES: No pedal edema, cyanosis, or clubbing.  NEUROLOGIC:  lethargic, unable to follow commands for full neuro exam this morning. PSYCHIATRIC: The patient is alert and oriented x 3.  SKIN: No obvious rash, lesion, or ulcer.    LABORATORY PANEL:   CBC Recent Labs  Lab 11/26/18 0553  WBC 6.6  HGB 8.8*  HCT 27.8*  PLT 90*   ------------------------------------------------------------------------------------------------------------------  Chemistries  Recent Labs  Lab 11/23/18 0526  11/26/18 0553  NA 135   < > 133*  K 3.8   < > 3.5  CL 99   < > 103  CO2 24   < > 20*  GLUCOSE 284*   < > 113*  BUN 65*   < > 66*  CREATININE 3.96*   < > 3.22*  CALCIUM 7.2*   < > 7.2*  MG 1.3*  --   --   AST 105*  --   --   ALT 28  --   --   ALKPHOS 44  --   --   BILITOT 0.7  --   --    < > = values in this interval not displayed.   ------------------------------------------------------------------------------------------------------------------  Cardiac Enzymes Recent Labs  Lab 11/22/18 1116  TROPONINI 9.30*   ------------------------------------------------------------------------------------------------------------------  RADIOLOGY:  Dg Chest Port 1 View  Result Date: 11/26/2018 CLINICAL DATA:  Respiratory failure EXAM: PORTABLE CHEST 1 VIEW COMPARISON:  Two days ago FINDINGS: Biventricular pacer leads from the left in unremarkable position. Right IJ line with tip at the SVC. Cardiomegaly. Low volumes and interstitial coarsening. Streaky density on the right. No pneumothorax. IMPRESSION: 1. Mild decrease in right perihilar opacity. 2. Lung volumes  remain low. Electronically Signed   By: Monte Fantasia M.D.   On: 11/26/2018 07:24     ASSESSMENT AND PLAN:  *Acute hypovolemic shock with MOD Secondary to recurrent staph aureus bacteremia  Most likely due to infected AICD, awaiting transfer to [facility] for AICD removal, vasopressor support.  *Acute recurrent Staph aureus  bacteremia Patient is on nafcillin Patient has been accepted to [facility] further care  * Acute kidney injury Secondary to sepsis Nephrology input greatly appreciated, continue to avoid nephrotoxic agents  *Acute on chronic anemia of chronic k disease S/p 1 unit of blood transfusion  *Elevated troponins/ non-ST elevation MI Continue aspirin, Plavix, statins  patient with severe CAD, continue supplemental oxygen,  *Recurrent MSSA bacteremia on nafcillin-ID input greatly appreciated patient needs ICD removal, seen by EP physician Dr. Caryl Comes recommends transfer to tertiary facility, patient chose [facility], patient is accepted at [facility] under cardiology service, Dr. Enrigue Catena is accepting physician, patient will go to [facility] when the bed is available.  Patient will be transferred from ICU level of care to stepdown status.   *diabetes mellitus type 2 Stable on current regiment  Condition critical, high risk for cardiac arrest.  Poor prognosis. All the records are reviewed and case discussed with Care Management/Social Workerr. Management plans discussed with the patient, family and they are in agreement.  CODE STATUS: Full code  TOTAL TIME TAKING CARE OF THIS PATIENT: 40 minutes.   Patient will go to [facility] when the bed is available.  Discharge summary, instructions, EMTALAare already filled.  Case discussed with cardiology, intensivist. Avel Peace Salary M.D on 11/26/2018 at 12:58 PM  Between 7am to 6pm - Pager - [phone]  After 6pm go to [email or web address] - password [email or web address] [facility]  Office  [phone]  CC: Primary care physician; Rutherford Guys, MD

## 2018-11-26 NOTE — Progress Notes (Signed)
Nutrition Brief Note  Patient identified on the Malnutrition Screening Tool (MST) Report  Wt Readings from Last 15 Encounters:  11/26/18 (!) 136.3 kg  11/11/18 132.7 kg  11/10/18 132.6 kg  11/09/18 133.3 kg  11/09/18 132 kg  11/04/18 128.6 kg  10/29/18 135.8 kg  10/28/18 (!) 137.2 kg  10/27/18 (!) 139.2 kg  10/27/18 (!) 139.3 kg  10/27/18 (!) 139.3 kg  10/16/18 (!) 136.3 kg  10/09/18 134.2 kg  07/28/18 130 kg  07/20/18 51.60 kg   63 year old female with PMHx of DM type 2, neuropathy, vitamin D deficiency, CHF, HTN, anemia, AICD in place, CAD, OSA, hx MI, depression, CKD admitted with AKI, sepsis, recurrent staph aureus bacteremia. Patient pending transfer to [facility] for removal of AICD.  Met with patient at bedside. She reports her appetite is good and unchanged from baseline. She is eating 90-100% of most meals. She did eat 50% of a couple meals but reports they were very large meals and she was eating her typical amount. At home she eats 2-3 meals per day. Denies any weight loss. She has been seen by our service for diet education before and reports she does not have any questions at this time regarding a heart healthy/carbohydrate modified diet. No subcutaneous fat or muscle wasting on nutrition-focused physical exam. Patient does not meet criteria for malnutrition.  Body mass index is 45.69 kg/m. Patient meets criteria for obesity class III based on current BMI.   Current diet order is heart healthy/carbohydrate modified, patient is consuming approximately 90-100% of meals at this time. Labs and medications reviewed.   No nutrition interventions warranted at this time. If nutrition issues arise, please consult RD.   Willey Blade, MS, [place], LDN Office: [phone] Pager: [phone] After Hours/Weekend Pager: [phone]

## 2018-11-27 ENCOUNTER — Ambulatory Visit: Payer: PPO | Admitting: Family Medicine

## 2018-11-27 LAB — GLUCOSE, CAPILLARY
Glucose-Capillary: 189 mg/dL — ABNORMAL HIGH (ref 70–99)
Glucose-Capillary: 211 mg/dL — ABNORMAL HIGH (ref 70–99)
Glucose-Capillary: 173 mg/dL — ABNORMAL HIGH (ref 70–99)
Glucose-Capillary: 200 mg/dL — ABNORMAL HIGH (ref 70–99)
Glucose-Capillary: 196 mg/dL — ABNORMAL HIGH (ref 70–99)

## 2018-11-27 MED ORDER — DOCUSATE SODIUM 100 MG PO CAPS
100.0000 mg | ORAL_CAPSULE | Freq: Two times a day (BID) | ORAL | Status: DC
  Administered 2018-11-27 – 2018-11-28 (×2): 100 mg via ORAL
  Filled 2018-11-27 (×4): qty 1

## 2018-11-27 MED ORDER — CARVEDILOL 6.25 MG PO TABS
6.2500 mg | ORAL_TABLET | Freq: Two times a day (BID) | ORAL | Status: DC
  Administered 2018-11-27 – 2018-11-28 (×3): 6.25 mg via ORAL
  Filled 2018-11-27 (×3): qty 1

## 2018-11-27 NOTE — Progress Notes (Addendum)
[facility] [place] NAME: [name]    MR#:  [id number]  DATE OF BIRTH:  09/19/1956  Patient is still waiting for bed at [facility].  CHIEF COMPLAINT:   Awaiting transfer to [facility] for removal of infected AICD, patient complaining of constipation only, denies pain, feels better  REVIEW OF SYSTEMS:    Review of Systems  Constitutional: Negative for chills and fever.  HENT: Negative for hearing loss.   Eyes: Negative for blurred vision, double vision and photophobia.  Respiratory: Negative for cough, hemoptysis and shortness of breath.   Cardiovascular: Negative for palpitations, orthopnea and leg swelling.  Gastrointestinal: Negative for abdominal pain, diarrhea and vomiting.  Genitourinary: Negative for dysuria and urgency.  Musculoskeletal: Negative for myalgias and neck pain.  Skin: Negative for rash.  Neurological: Negative for dizziness, focal weakness, seizures, weakness and headaches.  Psychiatric/Behavioral: Negative for memory loss. The patient does not have insomnia.     Nutrition:  Tolerating Diet: Tolerating PT:      DRUG ALLERGIES:  No Known Allergies  VITALS:  Blood pressure 130/65, pulse 81, temperature (!) 97.5 F (36.4 C), temperature source Oral, resp. rate 18, height 5\' 8"  (1.727 m), weight (!) 137.8 kg, SpO2 100 %.  PHYSICAL EXAMINATION:   Physical Exam  GENERAL:  63 y.o.-year-old patient lying in the bed with no acute distress.  Appears dehydrated EYES: Pupils equal, round, reactive to light . No scleral icterus. Extraocular muscles intact.  HEENT: Head atraumatic, normocephalic. Oropharynx and nasopharynx clear.  Patient mucosa is dry. NECK:  Supple, no jugular venous distention. No thyroid enlargement, no tenderness.  LUNGS: Diminished air entry bilaterally.  CARDIOVASCULAR: S1 S1, S2 regular, patient has systolic murmur grade 2/6, diastolic murmur with a grade 2/6 present   aBDOMEN: Soft,  nontender, nondistended. Bowel sounds present. No organomegaly or mass.  EXTREMITIES: No pedal edema, cyanosis, or clubbing.  NEUROLOGIC:  lethargic, unable to follow commands for full neuro exam this morning. PSYCHIATRIC: The patient is alert and oriented x 3.  SKIN: No obvious rash, lesion, or ulcer.    LABORATORY PANEL:   CBC Recent Labs  Lab 11/26/18 0553  WBC 6.6  HGB 8.8*  HCT 27.8*  PLT 90*   ------------------------------------------------------------------------------------------------------------------  Chemistries  Recent Labs  Lab 11/23/18 0526  11/26/18 0553  NA 135   < > 133*  K 3.8   < > 3.5  CL 99   < > 103  CO2 24   < > 20*  GLUCOSE 284*   < > 113*  BUN 65*   < > 66*  CREATININE 3.96*   < > 3.22*  CALCIUM 7.2*   < > 7.2*  MG 1.3*  --   --   AST 105*  --   --   ALT 28  --   --   ALKPHOS 44  --   --   BILITOT 0.7  --   --    < > = values in this interval not displayed.   ------------------------------------------------------------------------------------------------------------------  Cardiac Enzymes Recent Labs  Lab 11/22/18 1116  TROPONINI 9.30*   ------------------------------------------------------------------------------------------------------------------  RADIOLOGY:  Dg Chest Port 1 View  Result Date: 11/26/2018 CLINICAL DATA:  Respiratory failure EXAM: PORTABLE CHEST 1 VIEW COMPARISON:  Two days ago FINDINGS: Biventricular pacer leads from the left in unremarkable position. Right IJ line with tip at the SVC. Cardiomegaly. Low volumes and interstitial coarsening. Streaky density on the right. No pneumothorax. IMPRESSION: 1. Mild  decrease in right perihilar opacity. 2. Lung volumes remain low. Electronically Signed   By: Monte Fantasia M.D.   On: 11/26/2018 07:24     ASSESSMENT AND PLAN:  *Acute hypovolemic shock with MOD Resolved Secondary to recurrent staph aureus bacteremia  Most likely due to infected AICD, awaiting transfer to  [facility] for AICD removal, vasopressor support weaned off, status post transfer out of ICU on November 26, 2018  *Acute recurrent MSSA bacteremia Stable  Infectious disease input greatly appreciated-continue nafcillin  patient needs ICD removal, seen by EP physician Dr. Caryl Comes recommended transfer to tertiary facility, patient chose [facility], patient accepted at [facility] cardiology/Dr. Enrigue Catena - accepting physician, currently awaiting bed   * Acute kidney injury Secondary to sepsis Nephrology input greatly appreciated, continue to avoid nephrotoxic agents  *Acute on chronic anemia of chronic k disease S/p 1 unit of blood transfusion  *Elevated troponins/ non-ST elevation MI Continue aspirin, Plavix, statins  patient with severe CAD, continue supplemental oxygen,  *diabetes mellitus type 2 Stable on current regiment  Disposition to [facility] hospital when bed is available  Condition critical, high risk for cardiac arrest.  Poor prognosis. All the records are reviewed and case discussed with Care Management/Social Workerr. Management plans discussed with the patient, family and they are in agreement.  CODE STATUS: Full code  TOTAL TIME TAKING CARE OF THIS PATIENT:35 minutes.   Patient will go to [facility] when the bed is available.  Discharge summary, instructions, EMTALAare already filled.  Case discussed with cardiology, intensivist. Avel Peace  M.D on 11/27/2018 at 10:03 AM  Between 7am to 6pm - Pager - [phone]  After 6pm go to [email or web address] - password [email or web address] [facility]  Office  [phone]  CC: Primary care physician; Rutherford Guys, MD

## 2018-11-27 NOTE — Plan of Care (Signed)
  Problem: Nutrition: Goal: Adequate nutrition will be maintained Outcome: Progressing   Problem: Coping: Goal: Level of anxiety will decrease Outcome: Progressing   Problem: Elimination: Goal: Will not experience complications related to urinary retention Outcome: Progressing   Problem: Pain Managment: Goal: General experience of comfort will improve Outcome: Progressing Note:  Complained of neuropathy type pain in legs, received tylenol once last night with relief   Problem: Safety: Goal: Ability to remain free from injury will improve Outcome: Progressing

## 2018-11-27 NOTE — Progress Notes (Signed)
Date of Admission:  11/22/2018    Today 11/27/18  ID: [name] is a 63 y.o. female  Active Problems:   NSTEMI (non-ST elevated myocardial infarction) ([place]) MSSa bacteremia, recurrent CIED infection AKI secondary to ATN-  Anemia CHF DM  Subjective: Doing well No fever or chills cheerful  Medications:  . aspirin EC  81 mg Oral Daily  . clopidogrel  75 mg Oral Daily  . docusate sodium  100 mg Oral BID  . ferrous sulfate  325 mg Oral Daily  . heparin injection (subcutaneous)  5,000 Units Subcutaneous Q8H  . insulin aspart  0-15 Units Subcutaneous TID WC  . insulin aspart  0-5 Units Subcutaneous QHS  . insulin glargine  16 Units Subcutaneous QHS  . mouth rinse  15 mL Mouth Rinse BID  . metoprolol tartrate  12.5 mg Oral BID  . omega-3 acid ethyl esters  1 g Oral BID  . rosuvastatin  40 mg Oral QHS  . sodium chloride flush  10-40 mL Intracatheter Q12H    Objective: Vital signs in last 24 hours: Temp:  [97.5 F (36.4 C)-98.5 F (36.9 C)] 97.5 F (36.4 C) (02/21 0734) Pulse Rate:  [80-94] 81 (02/21 0734) Resp:  [18-22] 18 (02/20 1637) BP: (121-146)/(55-68) 130/65 (02/21 0734) SpO2:  [95 %-100 %] 100 % (02/21 0734) Weight:  [137.8 kg] 137.8 kg (02/21 0501)  PHYSICAL EXAM:  General: Alert, cooperative, no distress, pale, obese Head: Normocephalic, without obvious abnormality, atraumatic. Eyes: Conjunctivae clear, anicteric sclerae. Pupils are equal ENT Nares normal. No drainage or sinus tenderness. Lips, mucosa, and tongue normal. No Thrush Neck: Supple, symmetrical, no adenopathy, thyroid: non tender no carotid bruit and no JVD. Back: No CVA tenderness. Lungs: b/l ir entry Heart: Regular rate and rhythm, no murmur, rub or gallop. Abdomen: Soft, non-tender,not distended. Bowel sounds normal. No masses Extremities: atraumatic, no cyanosis. No edema. No clubbing Skin: No rashes or lesions. Or bruising Lymph: Cervical, supraclavicular normal. Neurologic:  Grossly non-focal  Lab Results Recent Labs    11/25/18 0523 11/26/18 0553  WBC 7.3 6.6  HGB 8.8* 8.8*  HCT 27.4* 27.8*  NA 133* 133*  K 3.5 3.5  CL 101 103  CO2 21* 20*  BUN 70* 66*  CREATININE 3.54* 3.22*   Microbiology: 2/16 [facility] MSSA 2/19 BC NG  Studies/Results:    Assessment/Plan: 63 y.o.femalewith ahistory ofrecently treated MSSA bacteremia and osteomyelitis of the 2nd left toe with 6 weeks of Iv cefazolin and amputation of the toe, Diabetes mellitus, coronary artery disease, cardiomyopathy,CHFAICD,  was admitted with altered mental status   Recurrent/ Staph aureus bacteremia- was treated with 6 weeks of IV cefazolin Jan/Feb .Durig the first time the source was an infected 2nd toe left foot and it was amputated on 10/08/18 and cultures grew MSSA and pathology showed acute osteomyelitis. ICD was not removed as she had a clear source for the bacteremia and there was no pocket infection or endocarditis or lead vegetation on TEE. Blood culture from 12/24, 1/7 and 2/11 were neg ( all on antibiotics) She was followed by me and cardiologist as OP and the plan was to monitor her with weekly blood culture for 3 weeks after completion of antibiotic Her last day of antibiotic was 2/13 and she now has MSSA in blood from 11/22/18- repeat culture sent 11/25/18 Ng so far- on nafcillin- will need 4-6 weeks depending on TEE  AICD needs be removed--She will need repeat TEE to make sure there is no valve Vegetation  awaiting transfer to [facility] for removal of device ? Thrombocytopenia likely due to sepsis- stable - ??? cefazolin (hadnormal platelet onweek 6of cefazolin- on 11/10/18 Currently on nafcillin   AKI on CKD,,improving   Hypoxemia secondary to sepsis with underlying CHF-much improved  RecentAcute osteomyelitis of the left 2nd toe- treated with surgery and IV antibiotic -healed completely  CHF-management as per cardiologist    Discussed the management  with patient and the care team

## 2018-11-27 NOTE — Progress Notes (Signed)
Progress Note  Patient Name: [name] Date of Encounter: 11/27/2018  Primary Cardiologist: Dr. Fletcher Anon  Subjective   Reports that she feels well, sitting up in recliner, on nasal cannula oxygen Denies any malaise, chills fever, diaphoresis Feels dramatically better from initial presentation several days ago Reports symptoms started 3 days after she stopped antibiotics  Inpatient Medications    Scheduled Meds: . aspirin EC  81 mg Oral Daily  . clopidogrel  75 mg Oral Daily  . docusate sodium  100 mg Oral BID  . ferrous sulfate  325 mg Oral Daily  . heparin injection (subcutaneous)  5,000 Units Subcutaneous Q8H  . insulin aspart  0-15 Units Subcutaneous TID WC  . insulin aspart  0-5 Units Subcutaneous QHS  . insulin glargine  16 Units Subcutaneous QHS  . mouth rinse  15 mL Mouth Rinse BID  . metoprolol tartrate  12.5 mg Oral BID  . omega-3 acid ethyl esters  1 g Oral BID  . rosuvastatin  40 mg Oral QHS  . sodium chloride flush  10-40 mL Intracatheter Q12H   Continuous Infusions: . sodium chloride Stopped (11/27/18 0614)  . nafcillin IV 2 g (11/27/18 1728)   PRN Meds: sodium chloride, acetaminophen, albuterol, nitroGLYCERIN, ondansetron (ZOFRAN) IV, pneumococcal 23 valent vaccine, sodium chloride flush   Vital Signs    Vitals:   11/26/18 1921 11/27/18 0501 11/27/18 0734 11/27/18 1644  BP: (!) 145/68 (!) 121/55 130/65 (!) 148/70  Pulse: 94 80 81 84  Resp:      Temp: 98.5 F (36.9 C) 97.8 F (36.6 C) (!) 97.5 F (36.4 C) 97.7 F (36.5 C)  TempSrc: Oral Oral Oral Oral  SpO2: 95% 98% 100% 99%  Weight:  (!) 137.8 kg    Height:        Intake/Output Summary (Last 24 hours) at 11/27/2018 1801 Last data filed at 11/27/2018 0736 Gross per 24 hour  Intake 355.17 ml  Output 1725 ml  Net -1369.83 ml   Last 3 Weights 11/27/2018 11/26/2018 11/25/2018  Weight (lbs) 303 lb 12.8 oz 300 lb 7.8 oz 287 lb 11.2 oz  Weight (kg) 137.803 kg 136.3 kg 130.5 kg  Some encounter  information is confidential and restricted. Go to Review Flowsheets activity to see all data.      Telemetry    Sinus with paced rhythm- Personally Reviewed  ECG    Not performed today- Personally Reviewed  Physical Exam   GEN: No acute distress.   Neck: No JVD Cardiac: RRR, no  rubs, or gallops.  2 out of 6 systolic murmur at the base Respiratory: Clear to auscultation bilaterally. GI: Soft, nontender, non-distended  MS: No edema; No deformity. Neuro:  Nonfocal  Psych: Normal affect   Labs    Chemistry Recent Labs  Lab 11/22/18 1116 11/23/18 0526 11/24/18 0519 11/25/18 0523 11/26/18 0553  NA 132* 135 132* 133* 133*  K 4.1 3.8 3.5 3.5 3.5  CL 93* 99 101 101 103  CO2 27 24 22  21* 20*  GLUCOSE 251* 284* 211* 103* 113*  BUN 58* 65* 69* 70* 66*  CREATININE 3.73* 3.96* 3.60* 3.54* 3.22*  CALCIUM 7.5* 7.2* 7.0* 7.1* 7.2*  PROT 6.6 5.9*  --   --   --   ALBUMIN 2.9* 2.5*  --   --   --   AST 197* 105*  --   --   --   ALT 33 28  --   --   --   [facility]  43 44  --   --   --   BILITOT 0.8 0.7  --   --   --   GFRNONAA 12* 11* 13* 13* 14*  GFRAA 14* 13* 15* 15* 17*  ANIONGAP 12 12 9 11 10      Hematology Recent Labs  Lab 11/24/18 0519 11/25/18 0523 11/26/18 0553  WBC 7.1 7.3 6.6  RBC 3.23* 3.15* 3.17*  HGB 9.1* 8.8* 8.8*  HCT 29.0* 27.4* 27.8*  MCV 89.8 87.0 87.7  MCH 28.2 27.9 27.8  MCHC 31.4 32.1 31.7  RDW 15.5 15.4 15.9*  PLT 97* 84* 90*    Cardiac Enzymes Recent Labs  Lab 11/22/18 1116  TROPONINI 9.30*   No results for input(s): TROPIPOC in the last 168 hours.   BNP Recent Labs  Lab 11/22/18 1116  BNP 2,246.0*     DDimer No results for input(s): DDIMER in the last 168 hours.   Radiology    Dg Chest Port 1 View  Result Date: 11/26/2018 CLINICAL DATA:  Respiratory failure EXAM: PORTABLE CHEST 1 VIEW COMPARISON:  Two days ago FINDINGS: Biventricular pacer leads from the left in unremarkable position. Right IJ line with tip at the SVC.  Cardiomegaly. Low volumes and interstitial coarsening. Streaky density on the right. No pneumothorax. IMPRESSION: 1. Mild decrease in right perihilar opacity. 2. Lung volumes remain low. Electronically Signed   By: Monte Fantasia M.D.   On: 11/26/2018 07:24    Cardiac Studies   LHC 10/05/18  Prox Cx to Dist Cx lesion is 80% stenosed.  Prox LAD-1 lesion is 40% stenosed.  Prox LAD-2 lesion is 90% stenosed.  Prox LAD to Mid LAD lesion is 80% stenosed.  Mid LAD to Dist LAD lesion is 70% stenosed.  1st Diag lesion is 90% stenosed.  Ost 1st Diag lesion is 80% stenosed.  Mid RCA-1 lesion is 40% stenosed.  Mid RCA-2 lesion is 40% stenosed. Final Conclusions:  Severe disease of LAD and diagonal branch Patent proximal LAD stent with severe stenosis at the distal edge of the stent Remainder of the LAD is also small, diffusely diseased, diagonal branch diffusely diseased -RCA large vessel dominant with mild disease, left circumflex/OM with mild disease proximal region  Recommendations:  Case discussed with interventional cardiology, Placing a short stent in the distal edge of the LAD stent may provide little clinical benefit with high risk and may delay surgery on her osteomyelitis/toe infection. Troponin elevation likely secondary to demand ischemia in the setting of febrile, tachycardic, hypertensive on arrival in the setting of bacteremia --Would favor medical management at this time and proceeding with removal of her toe.   TEE 10/02/18 - Left ventricle: Systolic function was mildly reduced. The estimated ejection fraction was in the range of 45% to 50%. - Aortic valve: No evidence of vegetation. There was trivial regurgitation. - Mitral valve: There was mild regurgitation. - Left atrium: No evidence of thrombus in the atrial cavity or appendage. - Right atrium: No evidence of thrombus in the atrial cavity or appendage. - Atrial septum: Echo contrast study showed no  right-to-left atrial level shunt, at baseline or with provocation. - Pulmonic valve: No evidence of vegetation.  Impressions: - No evidence of valve vegetation or wire associated vegetation.  TTE 09/29/18 - Left ventricle: The cavity size was normal. Systolic function was mildly reduced. The estimated ejection fraction was in the range of 45% to 50%. The study is not technically sufficient to allow evaluation of LV diastolic function. - Left atrium:  The atrium was mildly dilated.  Impressions: - Very suboptimal study. No evidence of vegetations. Consider TEE.  Patient Profile     63 y.o. female with history of coronary artery disease, chronic systolic heart failure status post CRT-D in 2015 with improvement in ejection fraction to 45%, type 2 diabetes, hypertension, hyperlipidemia and sleep apnea who presented with worsening fatigue and was found to be hypotensive with elevated troponin  Assessment & Plan    1.  Non-ST elevation myocardial infarction:  Known severe disease, supply demand ischemia in the setting of sepsis.   Results previously discussed with interventional cardiology, at that time intervention/PCI was not recommended (see catheterization report for complete details) Continue medical therapy with dual antiplatelet therapy and rosuvastatin.  2.  Recurrent MSSA bacteremia: Denies any foot pain concerning for residual osteomyelitis  Reports this has healed well  -Scheduled for transfer to [facility] for removal of her ICD Recurrent bacteremia 3 days after stopping antibiotics  3.  Acute renal failure:  Likely due to volume depletion and possible ATN due to hypotension.   Above her baseline  3.  Chronic systolic heart failure:  EF appears to be 40 to 45% Difficult pictures in December 2019 at that time 45 to 50% Not a good candidate for ACE inhibitor, ARB, spironolactone given her renal failure -We will Coreg 6.25 mg twice daily As outpatient was on  torsemide 40 twice daily alternating with 40 daily.  On hold secondary to severe renal dysfunction   Total encounter time more than 25 minutes  Greater than 50% was spent in counseling and coordination of care with the patient   For questions or updates, please contact [place] Please consult [email or web address] for contact info under        Signed, Ida Rogue, MD  11/27/2018, 6:01 PM

## 2018-11-27 NOTE — Progress Notes (Signed)
MDT coming this afternoon to interrogate device. Review of telemetry shows predominantly V paced rhythm with occasional PVCs and rare ventricular couplets. No labs for review this morning. Vitals stable. Await MDT interrogation.

## 2018-11-27 NOTE — Plan of Care (Signed)
  Problem: Clinical Measurements: Goal: Ability to maintain clinical measurements within normal limits will improve Outcome: Not Progressing Note:  Most recent sodium level = only 133. Will continue to monitor lab values. Wenda Low [facility]

## 2018-11-28 LAB — COMPREHENSIVE METABOLIC PANEL
ALT: 18 U/L (ref 0–44)
AST: 25 U/L (ref 15–41)
Albumin: 2.2 g/dL — ABNORMAL LOW (ref 3.5–5.0)
Alkaline Phosphatase: 54 U/L (ref 38–126)
Anion gap: 6 (ref 5–15)
BUN: 52 mg/dL — ABNORMAL HIGH (ref 8–23)
CO2: 24 mmol/L (ref 22–32)
Calcium: 7.7 mg/dL — ABNORMAL LOW (ref 8.9–10.3)
Chloride: 108 mmol/L (ref 98–111)
Creatinine, Ser: 2.44 mg/dL — ABNORMAL HIGH (ref 0.44–1.00)
GFR calc Af Amer: 23 mL/min — ABNORMAL LOW (ref >60)
GFR calc non Af Amer: 20 mL/min — ABNORMAL LOW (ref >60)
Glucose, Bld: 158 mg/dL — ABNORMAL HIGH (ref 70–99)
POTASSIUM: 3.4 mmol/L — AB (ref 3.5–5.1)
SODIUM: 138 mmol/L (ref 135–145)
Total Bilirubin: 1.2 mg/dL (ref 0.3–1.2)
Total Protein: 5.4 g/dL — ABNORMAL LOW (ref 6.5–8.1)

## 2018-11-28 LAB — GLUCOSE, CAPILLARY
GLUCOSE-CAPILLARY: 193 mg/dL — AB (ref 70–99)
Glucose-Capillary: 155 mg/dL — ABNORMAL HIGH (ref 70–99)
Glucose-Capillary: 245 mg/dL — ABNORMAL HIGH (ref 70–99)
Glucose-Capillary: 261 mg/dL — ABNORMAL HIGH (ref 70–99)

## 2018-11-28 MED ORDER — GABAPENTIN 300 MG PO CAPS
300.0000 mg | ORAL_CAPSULE | Freq: Three times a day (TID) | ORAL | Status: DC
  Administered 2018-11-28 (×3): 300 mg via ORAL
  Filled 2018-11-28 (×3): qty 1

## 2018-11-28 MED ORDER — ZOLPIDEM TARTRATE 5 MG PO TABS: 5.0000 mg | ORAL_TABLET | Freq: Every day | ORAL | Status: DC

## 2018-11-28 MED ORDER — POTASSIUM CHLORIDE CRYS ER 20 MEQ PO TBCR
20.0000 meq | EXTENDED_RELEASE_TABLET | Freq: Every day | ORAL | Status: DC
  Administered 2018-11-28: 20 meq via ORAL
  Filled 2018-11-28: qty 1

## 2018-11-28 NOTE — Progress Notes (Signed)
    Chart reviewed. The plan is to transfer to [facility] for removal of ICD Troponin elevated She denies any CP  This is likely demand ischemia in the setting of acute renal insufficiency.  [facility] will sign off.   Medication Recommendations:  Continue ABX Transfer to [facility] for ICD removal   Other recommendations (labs, testing, etc):   Follow up as an outpatient:  With her physicians at [place], [place]  11/28/2018 10:16 [address],  [place], [place]  [id number] Pager [phone] Phone: [phone]; Fax: [phone]

## 2018-11-28 NOTE — Plan of Care (Signed)
  Problem: Clinical Measurements: Goal: Will remain free from infection Outcome: Progressing   Problem: Activity: Goal: Risk for activity intolerance will decrease Outcome: Progressing   Problem: Pain Managment: Goal: General experience of comfort will improve Outcome: Progressing   Problem: Safety: Goal: Ability to remain free from injury will improve Outcome: Progressing   

## 2018-11-28 NOTE — Progress Notes (Signed)
[facility] has bed available for patient. [facility] is delayed and asked if [facility] could provide transport. They will call back to let us know.  Patient going to [place].

## 2018-11-28 NOTE — Plan of Care (Signed)
  Problem: Health Behavior/Discharge Planning: Goal: Ability to manage health-related needs will improve Outcome: Progressing   Problem: Clinical Measurements: Goal: Will remain free from infection Outcome: Progressing   Problem: Activity: Goal: Risk for activity intolerance will decrease Outcome: Progressing   Problem: Safety: Goal: Ability to remain free from injury will improve Outcome: Progressing   

## 2018-11-28 NOTE — Progress Notes (Signed)
Called in report to [facility] and talked to [place] the nurse and the transporter. Transporter states that they cant really tell what time they will be here to pick the pt have, but they will call and let us know. Will continue to monitor.

## 2018-11-28 NOTE — Progress Notes (Signed)
[place] [facility] [place] NAME: [name]    MR#:  [id number]  DATE OF BIRTH:  15-Jul-1956  Patient is still waiting for bed at [facility].  CHIEF COMPLAINT:   Patient still on the waiting list to remove AICD states that could not sleep last night also has restless leg syndrome and not taking her Neurontin   REVIEW OF SYSTEMS:    Review of Systems  Constitutional: Negative for chills and fever.  HENT: Negative for hearing loss.   Eyes: Negative for blurred vision, double vision and photophobia.  Respiratory: Negative for cough, hemoptysis and shortness of breath.   Cardiovascular: Negative for palpitations, orthopnea and leg swelling.  Gastrointestinal: Negative for abdominal pain, diarrhea and vomiting.  Genitourinary: Negative for dysuria and urgency.  Musculoskeletal: Negative for myalgias and neck pain.  Skin: Negative for rash.  Neurological: Negative for dizziness, focal weakness, seizures, weakness and headaches.  Psychiatric/Behavioral: Negative for memory loss. The patient does not have insomnia.     Nutrition:  Tolerating Diet: Tolerating PT:      DRUG ALLERGIES:  No Known Allergies  VITALS:  Blood pressure (!) 148/73, pulse 84, temperature (!) 97.4 F (36.3 C), temperature source Oral, resp. rate 18, height 5\' 8"  (1.727 m), weight (!) 137.6 kg, SpO2 100 %.  PHYSICAL EXAMINATION:   Physical Exam  GENERAL:  63 y.o.-year-old patient lying in the bed with no acute distress.  Appears dehydrated EYES: Pupils equal, round, reactive to light . No scleral icterus. Extraocular muscles intact.  HEENT: Head atraumatic, normocephalic. Oropharynx and nasopharynx clear.  Patient mucosa is dry. NECK:  Supple, no jugular venous distention. No thyroid enlargement, no tenderness.  LUNGS: Diminished air entry bilaterally.  CARDIOVASCULAR: S1 S1, S2 regular, patient has systolic murmur grade 2/6, diastolic murmur with a grade 2/6  present   aBDOMEN: Soft, nontender, nondistended. Bowel sounds present. No organomegaly or mass.  EXTREMITIES: No pedal edema, cyanosis, or clubbing.  NEUROLOGIC:  lethargic, unable to follow commands for full neuro exam this morning. PSYCHIATRIC: The patient is alert and oriented x 3.  SKIN: No obvious rash, lesion, or ulcer.    LABORATORY PANEL:   CBC Recent Labs  Lab 11/26/18 0553  WBC 6.6  HGB 8.8*  HCT 27.8*  PLT 90*   ------------------------------------------------------------------------------------------------------------------  Chemistries  Recent Labs  Lab 11/23/18 0526  11/28/18 0508  NA 135   < > 138  K 3.8   < > 3.4*  CL 99   < > 108  CO2 24   < > 24  GLUCOSE 284*   < > 158*  BUN 65*   < > 52*  CREATININE 3.96*   < > 2.44*  CALCIUM 7.2*   < > 7.7*  MG 1.3*  --   --   AST 105*  --  25  ALT 28  --  18  ALKPHOS 44  --  54  BILITOT 0.7  --  1.2   < > = values in this interval not displayed.   ------------------------------------------------------------------------------------------------------------------  Cardiac Enzymes Recent Labs  Lab 11/22/18 1116  TROPONINI 9.30*   ------------------------------------------------------------------------------------------------------------------  RADIOLOGY:  No results found.   ASSESSMENT AND PLAN:  *Acute hypovolemic shock with MOD Resolved Secondary to recurrent staph aureus bacteremia  Most likely due to infected AICD, awaiting transfer to [facility] for AICD removal,  Patient still on the list verified by [facility] called this morning   *Acute recurrent MSSA bacteremia Stable  Infectious  disease input greatly appreciated-continue nafcillin  patient needs ICD removal, seen by EP physician Dr. Caryl [name] recommended transfer to tertiary facility, patient chose [facility], patient accepted at [facility] cardiology/Dr. Enrigue [name] - accepting physician, currently awaiting bed   * Acute kidney injury improving slowly Secondary to  sepsis Nephrology input greatly appreciated, continue to avoid nephrotoxic agents  *Acute on chronic anemia of chronic kidney disease S/p 1 unit of blood transfusion  *Elevated troponins/ non-ST elevation MI Continue aspirin, Plavix, statins  patient with severe CAD, continue supplemental oxygen,  *diabetes mellitus type 2 Stable on current regiment  Disposition to [facility] hospital when bed is available  Condition critical, high risk for cardiac arrest.  Poor prognosis. All the records are reviewed and case discussed with Care Management/Social Workerr. Management plans discussed with the patient, family and they are in agreement.  CODE STATUS: Full code  TOTAL TIME TAKING CARE OF THIS PATIENT:35 minutes.   Patient will go to [facility] when the bed is available.  Discharge summary, instructions, EMTALAare already filled.  Case discussed with cardiology, intensivist. Dustin Flock M.D on 11/28/2018 at 1:30 PM  Between 7am to 6pm - Pager - [phone]  After 6pm go to [email or web address] - password [email or web address] [facility]  Office  [phone]  CC: Primary care physician; Vickie Guys, MD

## 2018-11-28 NOTE — Progress Notes (Signed)
[place], [place] 11/28/18  Subjective:   Patient overall feels better.  Serum creatinine is decreasing and is down to 2.4.  Urine output greater than 3 L last 24 hours.  Serum potassium is slightly low at 3.4 She is able to eat without any problem No shortness of breath  Objective:  Vital signs in last 24 hours:  Temp:  [97.4 F (36.3 C)-98.2 F (36.8 C)] 97.4 F (36.3 C) (02/22 0744) Pulse Rate:  [84-103] 84 (02/22 0744) BP: (141-148)/(67-77) 148/73 (02/22 0744) SpO2:  [98 %-100 %] 100 % (02/22 0744) Weight:  [137.6 kg] 137.6 kg (02/22 0411)  Weight change: -0.181 kg Filed Weights   11/26/18 0500 11/27/18 0501 11/28/18 0411  Weight: (!) 136.3 kg (!) 137.8 kg (!) 137.6 kg    Intake/Output:    Intake/Output Summary (Last 24 hours) at 11/28/2018 1329 Last data filed at 11/28/2018 0940 Gross per 24 hour  Intake -  Output 1400 ml  Net -1400 ml     Physical Exam: General:  Critically ill-appearing, laying in the bed  HEENT  moist oral mucous membranes  Neck  supple  Pulm/lungs  coarse breath sounds bilaterally  CVS/Heart  regular rhythm,    Abdomen:   Soft, nontender  Extremities:  Trace edema  Neurologic:  Alert, able to answer questions  Foley in place  Basic Metabolic Panel:  Recent Labs  Lab 11/23/18 0526 11/24/18 0519 11/25/18 0523 11/26/18 0553 11/28/18 0508  NA 135 132* 133* 133* 138  K 3.8 3.5 3.5 3.5 3.4*  CL 99 101 101 103 108  CO2 24 22 21* 20* 24  GLUCOSE 284* 211* 103* 113* 158*  BUN 65* 69* 70* 66* 52*  CREATININE 3.96* 3.60* 3.54* 3.22* 2.44*  CALCIUM 7.2* 7.0* 7.1* 7.2* 7.7*  MG 1.3*  --   --   --   --   PHOS 4.4  --   --   --   --      CBC: Recent Labs  Lab 11/22/18 1116 11/22/18 1850 11/23/18 0526 11/24/18 0519 11/25/18 0523 11/26/18 0553  WBC 4.8  --  7.4 7.1 7.3 6.6  NEUTROABS 4.1  --   --   --   --   --   HGB 7.0* 9.4* 9.1* 9.1* 8.8* 8.8*  HCT 22.4* 30.0* 29.4* 29.0* 27.4* 27.8*  MCV 91.8   --  88.8 89.8 87.0 87.7  PLT 84*  --  101* 97* 84* 90*     No results found for: HEPBSAG, HEPBSAB, HEPBIGM    Microbiology:  Recent Results (from the past 240 hour(s))  Blood culture (routine x 2)     Status: Abnormal   Collection Time: 11/22/18 11:16 AM  Result Value Ref Range Status   Specimen Description   Final    BLOOD BLOOD LEFT HAND Performed at [facility], [address], [place], [place] [address]    Special Requests   Final    BOTTLES DRAWN AEROBIC AND ANAEROBIC Blood Culture adequate volume Performed at [facility], [address] [place], [place] [address]    Culture  Setup Time   Final    GRAM POSITIVE COCCI IN BOTH AEROBIC AND ANAEROBIC BOTTLES CRITICAL RESULT CALLED TO, READ BACK BY AND VERIFIED WITH: LISA Cleveland 11/22/18 @ 1837  [place] Performed at [facility], [address] [place], [place] [address]    Culture STAPHYLOCOCCUS AUREUS (A)  Final   Report Status 11/24/2018 FINAL  Final  Organism ID, Bacteria STAPHYLOCOCCUS AUREUS  Final      Susceptibility   Staphylococcus aureus - MIC*    CIPROFLOXACIN 1 SENSITIVE Sensitive     ERYTHROMYCIN <=0.25 SENSITIVE Sensitive     GENTAMICIN <=0.5 SENSITIVE Sensitive     OXACILLIN <=0.25 SENSITIVE Sensitive     TETRACYCLINE <=1 SENSITIVE Sensitive     VANCOMYCIN <=0.5 SENSITIVE Sensitive     TRIMETH/SULFA <=10 SENSITIVE Sensitive     CLINDAMYCIN <=0.25 SENSITIVE Sensitive     RIFAMPIN <=0.5 SENSITIVE Sensitive     Inducible Clindamycin NEGATIVE Sensitive     * STAPHYLOCOCCUS AUREUS  Blood Culture ID Panel (Reflexed)     Status: Abnormal   Collection Time: 11/22/18 11:16 AM  Result Value Ref Range Status   Enterococcus species NOT DETECTED NOT DETECTED Final   Listeria monocytogenes NOT DETECTED NOT DETECTED Final   Staphylococcus species DETECTED (A) NOT DETECTED Final    Comment: CRITICAL RESULT CALLED TO, READ BACK BY AND VERIFIED WITH: LISA KLUTTZ 11/22/18 @ 1837  MLK     Staphylococcus aureus (BCID) DETECTED (A) NOT DETECTED Final    Comment: Methicillin (oxacillin) susceptible Staphylococcus aureus (MSSA). Preferred therapy is anti staphylococcal beta lactam antibiotic (Cefazolin or Nafcillin), unless clinically contraindicated. CRITICAL RESULT CALLED TO, READ BACK BY AND VERIFIED WITH: LISA KLUTTZ 11/22/18 @ 1837  MLK    Methicillin resistance NOT DETECTED NOT DETECTED Final   Streptococcus species NOT DETECTED NOT DETECTED Final   Streptococcus agalactiae NOT DETECTED NOT DETECTED Final   Streptococcus pneumoniae NOT DETECTED NOT DETECTED Final   Streptococcus pyogenes NOT DETECTED NOT DETECTED Final   Acinetobacter baumannii NOT DETECTED NOT DETECTED Final   Enterobacteriaceae species NOT DETECTED NOT DETECTED Final   Enterobacter cloacae complex NOT DETECTED NOT DETECTED Final   Escherichia coli NOT DETECTED NOT DETECTED Final   Klebsiella oxytoca NOT DETECTED NOT DETECTED Final   Klebsiella pneumoniae NOT DETECTED NOT DETECTED Final   Proteus species NOT DETECTED NOT DETECTED Final   Serratia marcescens NOT DETECTED NOT DETECTED Final   Haemophilus influenzae NOT DETECTED NOT DETECTED Final   Neisseria meningitidis NOT DETECTED NOT DETECTED Final   Pseudomonas aeruginosa NOT DETECTED NOT DETECTED Final   Candida albicans NOT DETECTED NOT DETECTED Final   Candida glabrata NOT DETECTED NOT DETECTED Final   Candida krusei NOT DETECTED NOT DETECTED Final   Candida parapsilosis NOT DETECTED NOT DETECTED Final   Candida tropicalis NOT DETECTED NOT DETECTED Final    Comment: Performed at [facility], [address] [place], [place] [address]  MRSA PCR Screening     Status: None   Collection Time: 11/22/18  3:44 PM  Result Value Ref Range Status   MRSA by PCR NEGATIVE NEGATIVE Final    Comment:        The GeneXpert MRSA Assay (FDA approved for NASAL specimens only), is one component of a comprehensive MRSA colonization surveillance  program. It is not intended to diagnose MRSA infection nor to guide or monitor treatment for MRSA infections. Performed at [facility], [address] [place], [place] [address]   Blood culture (routine x 2)     Status: Abnormal   Collection Time: 11/22/18  6:31 PM  Result Value Ref Range Status   Specimen Description   Final    BLOOD RIGHT HAND Performed at [facility], [place] [address], [place],  [address]    Special Requests   Final    BOTTLES DRAWN AEROBIC ONLY Blood  Culture results may not be optimal due to an inadequate volume of blood received in culture bottles Performed at [facility], [address] [place], [place] [address]    Culture  Setup Time   Final    GRAM POSITIVE COCCI AEROBIC BOTTLE ONLY CRITICAL RESULT CALLED TO, READ BACK BY AND VERIFIED WITH: [facility] KATSOUDAS AT 4818 11/23/18 [place] Performed at [facility], [address] [place], [place] [address]    Culture (A)  Final    STAPHYLOCOCCUS AUREUS SUSCEPTIBILITIES PERFORMED ON PREVIOUS CULTURE WITHIN THE LAST 5 DAYS. Performed at [facility], [place] [address], [place], [place] [address]    Report Status 11/24/2018 FINAL  Final  CULTURE, BLOOD (ROUTINE X 2) w Reflex to ID Panel     Status: None (Preliminary result)   Collection Time: 11/25/18 12:12 AM  Result Value Ref Range Status   Specimen Description BLOOD LEFT ASSIST CONTROL  Final   Special Requests   Final    BOTTLES DRAWN AEROBIC AND ANAEROBIC Blood Culture adequate volume   Culture   Final    NO GROWTH 3 DAYS Performed at [facility], [address], [place], [place] [address]    Report Status PENDING  Incomplete  CULTURE, BLOOD (ROUTINE X 2) w Reflex to ID Panel     Status: None (Preliminary result)   Collection Time: 11/25/18 12:14 AM  Result Value Ref Range Status   Specimen Description BLOOD LEFT FATTY CASTS  Final   Special Requests   Final    BOTTLES DRAWN AEROBIC AND  ANAEROBIC Blood Culture adequate volume   Culture   Final    NO GROWTH 3 DAYS Performed at [facility], [address], [place],  [address]    Report Status PENDING  Incomplete    Coagulation Studies: No results for input(s): LABPROT, INR in the last 72 hours.  Urinalysis: No results for input(s): COLORURINE, LABSPEC, PHURINE, GLUCOSEU, HGBUR, BILIRUBINUR, KETONESUR, PROTEINUR, UROBILINOGEN, NITRITE, LEUKOCYTESUR in the last 72 hours.  Invalid input(s): APPERANCEUR    Imaging: No results found.   Medications:   . sodium chloride Stopped (11/27/18 7412)  . nafcillin IV 2 g (11/28/18 1200)   . aspirin EC  81 mg Oral Daily  . carvedilol  6.25 mg Oral BID WC  . clopidogrel  75 mg Oral Daily  . docusate sodium  100 mg Oral BID  . ferrous sulfate  325 mg Oral Daily  . gabapentin  300 mg Oral TID  . heparin injection (subcutaneous)  5,000 Units Subcutaneous Q8H  . insulin aspart  0-15 Units Subcutaneous TID WC  . insulin aspart  0-5 Units Subcutaneous QHS  . insulin glargine  16 Units Subcutaneous QHS  . mouth rinse  15 mL Mouth Rinse BID  . omega-3 acid ethyl esters  1 g Oral BID  . rosuvastatin  40 mg Oral QHS  . sodium chloride flush  10-40 mL Intracatheter Q12H  . zolpidem  5 mg Oral QHS   sodium chloride, acetaminophen, albuterol, nitroGLYCERIN, ondansetron (ZOFRAN) IV, pneumococcal 23 valent vaccine, sodium chloride flush  Assessment/ Plan:  63 y.o. female insulin dependent diabetes mellitus type 2, diabetic neuropathy, hypertension, anemia, obstructive sleep apnea requiring CPAP, positive ANA, hyperlipidemia, systolic congestive heart failure status post AICD placement, coronary artery disease status post stent is admitted for sepsis  1.  Acute kidney injury.  Baseline creatinine 1.06/GFR 55 from November 03, 2018 Likely secondary to ATN from sepsis, non oliguric now. UOP >  3000 Currently has MSSA bacteremia from cultures drawn on February 16. Serum  creatinine continues to improve Do not anticipate any need for dialysis Patient to be transferred to a tertiary care center for removal of AICD  2.  Hypokalemia Replace potassium as needed   LOS: [place] 2/22/20201:29 PM  [place], [place]  Note: This note was prepared with Dragon dictation. Any transcription errors are unintentional

## 2018-11-28 NOTE — Discharge Summary (Addendum)
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 Type 2 diabetes Obstructive sleep apnea History of ischemic cardiomyopathy, LVEF 35%  DISCHARGE DIAGNOSES:   NSTEMI Acute encephalopathy, resolved Severe sepsis/septic shock, resolving Pulmonary edema, mild Hypoxemic respiratory failure, mild Anemia without evidence of acute blood loss presently AKI, nonoliguric CKD, stage III Type 2 diabetes, controlled Obstructive sleep apnea History of ischemic cardiomyopathy, LVEF 35% Thrombocytopenia (platelets 97,000) Recurrent/relapsing MSSA bacteremia  PRESENTATION:   Pt was admitted by the hospitalist service with the following HPI and the above admission diagnoses:  HISTORY OF PRESENT ILLNESS: [name]  is a 63 y.o. female with a history of myocardial infarction in the past, systolic heart failure with EF of 35%, AICD, coronary artery disease, recent osteomyelitis c/b MSSA bacteremia, neuropathy, obesity, hyperlipidemia, type 2 diabetes mellitus, chronic kidney disease, recently discharged from the hospital 2 weeks ago for heart failure exacerbation, recently completed antibiotic course for MSSA bacteremia, presents from home with altered mental status, complaints of shortness of breath, brought via EMS, noted to be hypoxic, placed on oxygen via nasal cannula is poor historian due to encephalopathy/confusion, ER work-up noted for mild tachycardia, hypotension with blood pressure in 70s, O2 saturation in the 80s, creatinine 3.7 with baseline 1.5, hemoglobin 7, platelet count 84, troponin 9.3, BNP greater than 2200, ED attending did discuss case with Dr. Rockey Situ who recommended heparin drip for non-STEMI, code sepsis initiated in the emergency  room, chest x-ray noted for questionable pneumonia, patient evaluated in the emergency room, significant other is at the bedside, patient is now being admitted for acute non-STEMI, acute kidney injury with chronic kidney disease stage III, acute on chronic worsening symptomatic anemia, and ?  Sepsis.  HOSPITAL COURSE:   She was admitted to the medical intensive care unit.  She was treated for severe sepsis with volume resuscitation and broad-spectrum antibiotics.  She was also transfused 2 units PRBCs for hemoglobin 7.0 on admission. She was seen in consultation by critical care medicine service.  She was supported with norepinephrine to maintain MAP goal >65 mmHg.    She was seen in consultation by cardiology.  It was felt that the elevated troponin represented demand ischemia.  It was noted that she underwent LHC in 09/2018 which revealed severe diffuse disease.  There was no plan for intervention at this time.    On the day of admission, blood cultures turned positive for methicillin sensitive staph aureus.  She was seen in consultation by infectious disease service and antibiotics were changed to nafcillin.  It was recommended that the AICD be removed given recurrent/relapsing bacteremia.  This is a procedure that cannot be done at this hospital and therefore transfer to [facility] has been arranged.  She was seen in consultation by nephrology service.  She did not require dialysis.  At the time of transfer, her creatinine is slightly improved at 3.6 (peak Cr 3.93 on 02/17) and her urine output has improved somewhat (485 cc/24hr).  Patient still waiting for bed she is currently stable awaiting transfer to [facility]   Echocardiogram 11/23/18:   1. The left ventricle has mild-moderately reduced systolic function, with an ejection fraction of 40-45%. The cavity size was mildly dilated. Left ventricular diastolic Doppler parameters are consistent with pseudonormalization.  2. The right ventricle has normal  systolic function. The cavity was normal. There is no increase in right ventricular wall thickness.  3. Left atrial size was mildly dilated.  4. Right atrial size was mildly dilated.  5. The mitral valve is normal in structure.  6. The tricuspid valve is normal in structure.  7. The aortic valve is normal in structure.  8. The pulmonic valve was normal in structure.  CXR: Biventricular pacer from the left in stable position. Right IJ line with tip at the upper cavoatrial junction. Stable borderline heart size. Stable asymmetric hazy right perihilar opacity on a background of generalized interstitial coarsening. No visible effusion. No pneumothorax.    Results for orders placed or performed during the hospital encounter of 11/22/18  Blood culture (routine x 2)     Status: Abnormal   Collection Time: 11/22/18 11:16 AM  Result Value Ref Range Status   Specimen Description   Final    BLOOD BLOOD LEFT HAND Performed at [facility], [address], [place], [place] [address]    Special Requests   Final    BOTTLES DRAWN AEROBIC AND ANAEROBIC Blood Culture adequate volume Performed at [facility], [address], [place], [place] [address]    Culture  Setup Time   Final    GRAM POSITIVE COCCI IN BOTH AEROBIC AND ANAEROBIC BOTTLES CRITICAL RESULT CALLED TO, READ BACK BY AND VERIFIED WITH: LISA KLUTTZ 11/22/18 @ 1837  [place] Performed at [facility], [address], [place], [place] [address]    Culture STAPHYLOCOCCUS AUREUS (A)  Final   Report Status 11/24/2018 FINAL  Final   Organism ID, Bacteria STAPHYLOCOCCUS AUREUS  Final      Susceptibility   Staphylococcus aureus - MIC*    CIPROFLOXACIN 1 SENSITIVE Sensitive     ERYTHROMYCIN <=0.25 SENSITIVE Sensitive     GENTAMICIN <=0.5 SENSITIVE Sensitive     OXACILLIN <=0.25 SENSITIVE Sensitive     TETRACYCLINE <=1 SENSITIVE Sensitive     VANCOMYCIN <=0.5 SENSITIVE Sensitive     TRIMETH/SULFA <=10 SENSITIVE  Sensitive     CLINDAMYCIN <=0.25 SENSITIVE Sensitive     RIFAMPIN <=0.5 SENSITIVE Sensitive     Inducible Clindamycin NEGATIVE Sensitive     * STAPHYLOCOCCUS AUREUS  Blood Culture ID Panel (Reflexed)     Status: Abnormal   Collection Time: 11/22/18 11:16 AM  Result Value Ref Range Status   Enterococcus species NOT DETECTED NOT DETECTED Final   Listeria monocytogenes NOT DETECTED NOT DETECTED Final   Staphylococcus species DETECTED (A) NOT DETECTED Final    Comment: CRITICAL RESULT CALLED TO, READ BACK BY AND VERIFIED WITH: LISA KLUTTZ 11/22/18 @ 1837  MLK    Staphylococcus aureus (BCID) DETECTED (A) NOT DETECTED Final    Comment: Methicillin (oxacillin) susceptible Staphylococcus aureus (MSSA). Preferred therapy is anti staphylococcal beta lactam antibiotic (Cefazolin or Nafcillin), unless clinically contraindicated. CRITICAL RESULT CALLED TO, READ BACK BY AND VERIFIED WITH: LISA KLUTTZ 11/22/18 @ 1837  MLK    Methicillin resistance NOT DETECTED NOT DETECTED Final   Streptococcus species NOT DETECTED NOT DETECTED Final   Streptococcus agalactiae NOT DETECTED NOT DETECTED Final   Streptococcus pneumoniae NOT DETECTED NOT DETECTED Final   Streptococcus pyogenes NOT DETECTED NOT DETECTED Final   Acinetobacter baumannii NOT DETECTED NOT DETECTED Final   Enterobacteriaceae species NOT DETECTED NOT DETECTED Final   Enterobacter cloacae complex NOT DETECTED NOT DETECTED Final   Escherichia coli NOT DETECTED NOT DETECTED Final   Klebsiella oxytoca NOT DETECTED NOT DETECTED  Final   Klebsiella pneumoniae NOT DETECTED NOT DETECTED Final   Proteus species NOT DETECTED NOT DETECTED Final   Serratia marcescens NOT DETECTED NOT DETECTED Final   Haemophilus influenzae NOT DETECTED NOT DETECTED Final   Neisseria meningitidis NOT DETECTED NOT DETECTED Final   Pseudomonas aeruginosa NOT DETECTED NOT DETECTED Final   Candida albicans NOT DETECTED NOT DETECTED Final   Candida glabrata NOT DETECTED NOT  DETECTED Final   Candida krusei NOT DETECTED NOT DETECTED Final   Candida parapsilosis NOT DETECTED NOT DETECTED Final   Candida tropicalis NOT DETECTED NOT DETECTED Final    Comment: Performed at [facility], [address] [place], [place] [address]  MRSA PCR Screening     Status: None   Collection Time: 11/22/18  3:44 PM  Result Value Ref Range Status   MRSA by PCR NEGATIVE NEGATIVE Final    Comment:        The GeneXpert MRSA Assay (FDA approved for NASAL specimens only), is one component of a comprehensive MRSA colonization surveillance program. It is not intended to diagnose MRSA infection nor to guide or monitor treatment for MRSA infections. Performed at [facility], [address] [place], [place] [address]   Blood culture (routine x 2)     Status: Abnormal   Collection Time: 11/22/18  6:31 PM  Result Value Ref Range Status   Specimen Description   Final    BLOOD RIGHT HAND Performed at [facility], [place] [address], [place], [place] [address]    Special Requests   Final    BOTTLES DRAWN AEROBIC ONLY Blood Culture results may not be optimal due to an inadequate volume of blood received in culture bottles Performed at [facility], [address] [place], [place] [address]    Culture  Setup Time   Final    GRAM POSITIVE COCCI AEROBIC BOTTLE ONLY CRITICAL RESULT CALLED TO, READ BACK BY AND VERIFIED WITH: [facility] KATSOUDAS AT 6433 11/23/18 SDR Performed at [facility], [address] [place], [place] [address]    Culture (A)  Final    STAPHYLOCOCCUS AUREUS SUSCEPTIBILITIES PERFORMED ON PREVIOUS CULTURE WITHIN THE LAST 5 DAYS. Performed at [facility], [place] [address], [place], [place] [address]    Report Status 11/24/2018 FINAL  Final  CULTURE, BLOOD (ROUTINE X 2) w Reflex to ID Panel     Status: None (Preliminary result)   Collection Time: 11/25/18 12:12 AM  Result Value Ref Range Status   Specimen  Description BLOOD LEFT ASSIST CONTROL  Final   Special Requests   Final    BOTTLES DRAWN AEROBIC AND ANAEROBIC Blood Culture adequate volume   Culture   Final    NO GROWTH 3 DAYS Performed at [facility], [address], [place], [place] [address]    Report Status PENDING  Incomplete  CULTURE, BLOOD (ROUTINE X 2) w Reflex to ID Panel     Status: None (Preliminary result)   Collection Time: 11/25/18 12:14 AM  Result Value Ref Range Status   Specimen Description BLOOD LEFT FATTY CASTS  Final   Special Requests   Final    BOTTLES DRAWN AEROBIC AND ANAEROBIC Blood Culture adequate volume   Culture   Final    NO GROWTH 3 DAYS Performed at [facility], [address], [place], [place] [address]    Report Status PENDING  Incomplete    Anti-infectives (From admission, onward)   Start  Dose/Rate Route Frequency Ordered Stop   11/23/18 1600  nafcillin 2 g in sodium chloride 0.9 % 100 mL IVPB     2 g 200 mL/hr over 30 Minutes Intravenous Every 4 hours 11/23/18 1417     11/23/18 1000  ceFAZolin (ANCEF) IVPB 2g/100 mL premix  Status:  Discontinued     2 g 200 mL/hr over 30 Minutes Intravenous Every 12 hours 11/22/18 1946 11/22/18 2142   11/23/18 0715  vancomycin (VANCOCIN) 2,000 mg in sodium chloride 0.9 % 500 mL IVPB  Status:  Discontinued     2,000 mg 250 mL/hr over 120 Minutes Intravenous  Once 11/23/18 0706 11/23/18 0707   11/22/18 2141  vancomycin variable dose per unstable renal function (pharmacist dosing)  Status:  Discontinued      Does not apply See admin instructions 11/22/18 2142 11/23/18 1417   11/22/18 1800  ceFEPIme (MAXIPIME) 1 g in sodium chloride 0.9 % 100 mL IVPB  Status:  Discontinued     1 g 200 mL/hr over 30 Minutes Intravenous Every 12 hours 11/22/18 1518 11/22/18 1946   11/22/18 1530  vancomycin (VANCOCIN) IVPB 1000 mg/200 mL premix     1,000 mg 200 mL/hr over 60 Minutes Intravenous  Once 11/22/18 1518 11/22/18 1835   11/22/18 1518   vancomycin variable dose per unstable renal function (pharmacist dosing)  Status:  Discontinued      Does not apply See admin instructions 11/22/18 1518 11/22/18 1946   11/22/18 1515  ceFEPIme (MAXIPIME) 1 g in sodium chloride 0.9 % 100 mL IVPB  Status:  Discontinued     1 g 200 mL/hr over 30 Minutes Intravenous Every 8 hours 11/22/18 1513 11/22/18 1518   11/22/18 1200  piperacillin-tazobactam (ZOSYN) IVPB 3.375 g     3.375 g 100 mL/hr over 30 Minutes Intravenous  Once 11/22/18 1150 11/22/18 1231   11/22/18 1200  vancomycin (VANCOCIN) IVPB 1000 mg/200 mL premix     1,000 mg 200 mL/hr over 60 Minutes Intravenous  Once 11/22/18 1150 11/22/18 1306       BMP Latest Ref Rng & Units 11/28/2018 11/26/2018 11/25/2018  Glucose 70 - 99 mg/dL 158(H) 113(H) 103(H)  BUN 8 - 23 mg/dL 52(H) 66(H) 70(H)  Creatinine 0.44 - 1.00 mg/dL 2.44(H) 3.22(H) 3.54(H)  BUN/Creat Ratio 12 - 28 - - -  Sodium 135 - 145 mmol/L 138 133(L) 133(L)  Potassium 3.5 - 5.1 mmol/L 3.4(L) 3.5 3.5  Chloride 98 - 111 mmol/L 108 103 101  CO2 22 - 32 mmol/L 24 20(L) 21(L)  Calcium 8.9 - 10.3 mg/dL 7.7(L) 7.2(L) 7.1(L)   CBC Latest Ref Rng & Units 11/26/2018 11/25/2018 11/24/2018  WBC 4.0 - 10.5 K/uL 6.6 7.3 7.1  Hemoglobin 12.0 - 15.0 g/dL 8.8(L) 8.8(L) 9.1(L)  Hematocrit 36.0 - 46.0 % 27.8(L) 27.4(L) 29.0(L)  Platelets 150 - 400 K/uL 90(L) 84(L) 97(L)   Hepatic Function Latest Ref Rng & Units 11/28/2018 11/23/2018 11/22/2018  Total Protein 6.5 - 8.1 g/dL 5.4(L) 5.9(L) 6.6  Albumin 3.5 - 5.0 g/dL 2.2(L) 2.5(L) 2.9(L)  AST 15 - 41 U/L 25 105(H) 197(H)  ALT 0 - 44 U/L 18 28 33  Alk Phosphatase 38 - 126 U/L 54 44 43  Total Bilirubin 0.3 - 1.2 mg/dL 1.2 0.7 0.8   Cardiac Panel (last 3 results) No results for input(s): CKTOTAL, CKMB, TROPONINI, RELINDX in the last 72 hours.   Dustin Flock M.D on 11/28/2018 at 1:30 PM  Between 7am to 6pm - Pager - [phone]  After 6pm go to [email or web address] - Museum/gallery curator Hospitalists  Office  [phone]

## 2018-11-29 DIAGNOSIS — I493 Ventricular premature depolarization: Secondary | ICD-10-CM | POA: Diagnosis not present

## 2018-11-29 DIAGNOSIS — R7881 Bacteremia: Secondary | ICD-10-CM | POA: Diagnosis not present

## 2018-11-29 DIAGNOSIS — Z6841 Body Mass Index (BMI) 40.0 and over, adult: Secondary | ICD-10-CM | POA: Diagnosis not present

## 2018-11-29 DIAGNOSIS — T82190A Other mechanical complication of cardiac electrode, initial encounter: Secondary | ICD-10-CM | POA: Diagnosis not present

## 2018-11-29 DIAGNOSIS — E662 Morbid (severe) obesity with alveolar hypoventilation: Secondary | ICD-10-CM | POA: Diagnosis not present

## 2018-11-29 DIAGNOSIS — E113292 Type 2 diabetes mellitus with mild nonproliferative diabetic retinopathy without macular edema, left eye: Secondary | ICD-10-CM | POA: Diagnosis not present

## 2018-11-29 DIAGNOSIS — E1122 Type 2 diabetes mellitus with diabetic chronic kidney disease: Secondary | ICD-10-CM | POA: Diagnosis not present

## 2018-11-29 DIAGNOSIS — K769 Liver disease, unspecified: Secondary | ICD-10-CM | POA: Diagnosis not present

## 2018-11-29 DIAGNOSIS — K521 Toxic gastroenteritis and colitis: Secondary | ICD-10-CM | POA: Diagnosis not present

## 2018-11-29 DIAGNOSIS — Z833 Family history of diabetes mellitus: Secondary | ICD-10-CM | POA: Diagnosis not present

## 2018-11-29 DIAGNOSIS — R0600 Dyspnea, unspecified: Secondary | ICD-10-CM | POA: Diagnosis not present

## 2018-11-29 DIAGNOSIS — T3695XA Adverse effect of unspecified systemic antibiotic, initial encounter: Secondary | ICD-10-CM | POA: Diagnosis not present

## 2018-11-29 DIAGNOSIS — I368 Other nonrheumatic tricuspid valve disorders: Secondary | ICD-10-CM | POA: Diagnosis not present

## 2018-11-29 DIAGNOSIS — A4901 Methicillin susceptible Staphylococcus aureus infection, unspecified site: Secondary | ICD-10-CM | POA: Diagnosis not present

## 2018-11-29 DIAGNOSIS — I4581 Long QT syndrome: Secondary | ICD-10-CM | POA: Diagnosis not present

## 2018-11-29 DIAGNOSIS — R000 Tachycardia, unspecified: Secondary | ICD-10-CM | POA: Diagnosis not present

## 2018-11-29 DIAGNOSIS — I252 Old myocardial infarction: Secondary | ICD-10-CM | POA: Diagnosis not present

## 2018-11-29 DIAGNOSIS — T827XXA Infection and inflammatory reaction due to other cardiac and vascular devices, implants and grafts, initial encounter: Secondary | ICD-10-CM | POA: Diagnosis not present

## 2018-11-29 DIAGNOSIS — M8659 Other chronic hematogenous osteomyelitis, multiple sites: Secondary | ICD-10-CM | POA: Diagnosis not present

## 2018-11-29 DIAGNOSIS — N183 Chronic kidney disease, stage 3 (moderate): Secondary | ICD-10-CM | POA: Diagnosis not present

## 2018-11-29 DIAGNOSIS — Z89422 Acquired absence of other left toe(s): Secondary | ICD-10-CM | POA: Diagnosis not present

## 2018-11-29 DIAGNOSIS — Z9841 Cataract extraction status, right eye: Secondary | ICD-10-CM | POA: Diagnosis not present

## 2018-11-29 DIAGNOSIS — E785 Hyperlipidemia, unspecified: Secondary | ICD-10-CM | POA: Diagnosis not present

## 2018-11-29 DIAGNOSIS — Z4502 Encounter for adjustment and management of automatic implantable cardiac defibrillator: Secondary | ICD-10-CM | POA: Diagnosis not present

## 2018-11-29 DIAGNOSIS — I5022 Chronic systolic (congestive) heart failure: Secondary | ICD-10-CM | POA: Diagnosis not present

## 2018-11-29 DIAGNOSIS — Z8619 Personal history of other infectious and parasitic diseases: Secondary | ICD-10-CM | POA: Diagnosis not present

## 2018-11-29 DIAGNOSIS — R918 Other nonspecific abnormal finding of lung field: Secondary | ICD-10-CM | POA: Diagnosis not present

## 2018-11-29 DIAGNOSIS — E1100 Type 2 diabetes mellitus with hyperosmolarity without nonketotic hyperglycemic-hyperosmolar coma (NKHHC): Secondary | ICD-10-CM | POA: Diagnosis not present

## 2018-11-29 DIAGNOSIS — I2690 Septic pulmonary embolism without acute cor pulmonale: Secondary | ICD-10-CM | POA: Diagnosis not present

## 2018-11-29 DIAGNOSIS — M5135 Other intervertebral disc degeneration, thoracolumbar region: Secondary | ICD-10-CM | POA: Diagnosis not present

## 2018-11-29 DIAGNOSIS — F329 Major depressive disorder, single episode, unspecified: Secondary | ICD-10-CM | POA: Diagnosis not present

## 2018-11-29 DIAGNOSIS — I420 Dilated cardiomyopathy: Secondary | ICD-10-CM | POA: Diagnosis not present

## 2018-11-29 DIAGNOSIS — M5125 Other intervertebral disc displacement, thoracolumbar region: Secondary | ICD-10-CM | POA: Diagnosis not present

## 2018-11-29 DIAGNOSIS — M869 Osteomyelitis, unspecified: Secondary | ICD-10-CM | POA: Diagnosis not present

## 2018-11-29 DIAGNOSIS — D696 Thrombocytopenia, unspecified: Secondary | ICD-10-CM | POA: Diagnosis not present

## 2018-11-29 DIAGNOSIS — I428 Other cardiomyopathies: Secondary | ICD-10-CM | POA: Diagnosis not present

## 2018-11-29 DIAGNOSIS — E7800 Pure hypercholesterolemia, unspecified: Secondary | ICD-10-CM | POA: Diagnosis not present

## 2018-11-29 DIAGNOSIS — E611 Iron deficiency: Secondary | ICD-10-CM | POA: Diagnosis not present

## 2018-11-29 DIAGNOSIS — Z6838 Body mass index (BMI) 38.0-38.9, adult: Secondary | ICD-10-CM | POA: Diagnosis not present

## 2018-11-29 DIAGNOSIS — E1169 Type 2 diabetes mellitus with other specified complication: Secondary | ICD-10-CM | POA: Diagnosis not present

## 2018-11-29 DIAGNOSIS — Z9889 Other specified postprocedural states: Secondary | ICD-10-CM | POA: Diagnosis not present

## 2018-11-29 DIAGNOSIS — Z452 Encounter for adjustment and management of vascular access device: Secondary | ICD-10-CM | POA: Diagnosis not present

## 2018-11-29 DIAGNOSIS — Z8249 Family history of ischemic heart disease and other diseases of the circulatory system: Secondary | ICD-10-CM | POA: Diagnosis not present

## 2018-11-29 DIAGNOSIS — E11319 Type 2 diabetes mellitus with unspecified diabetic retinopathy without macular edema: Secondary | ICD-10-CM | POA: Diagnosis not present

## 2018-11-29 DIAGNOSIS — N179 Acute kidney failure, unspecified: Secondary | ICD-10-CM | POA: Diagnosis not present

## 2018-11-29 DIAGNOSIS — I447 Left bundle-branch block, unspecified: Secondary | ICD-10-CM | POA: Diagnosis not present

## 2018-11-29 DIAGNOSIS — B9561 Methicillin susceptible Staphylococcus aureus infection as the cause of diseases classified elsewhere: Secondary | ICD-10-CM | POA: Diagnosis not present

## 2018-11-29 DIAGNOSIS — I25118 Atherosclerotic heart disease of native coronary artery with other forms of angina pectoris: Secondary | ICD-10-CM | POA: Diagnosis not present

## 2018-11-29 DIAGNOSIS — Z9842 Cataract extraction status, left eye: Secondary | ICD-10-CM | POA: Diagnosis not present

## 2018-11-29 DIAGNOSIS — Z8679 Personal history of other diseases of the circulatory system: Secondary | ICD-10-CM | POA: Diagnosis not present

## 2018-11-29 DIAGNOSIS — D500 Iron deficiency anemia secondary to blood loss (chronic): Secondary | ICD-10-CM | POA: Diagnosis not present

## 2018-11-29 DIAGNOSIS — R9431 Abnormal electrocardiogram [ECG] [EKG]: Secondary | ICD-10-CM | POA: Diagnosis not present

## 2018-11-29 DIAGNOSIS — H43813 Vitreous degeneration, bilateral: Secondary | ICD-10-CM | POA: Diagnosis not present

## 2018-11-29 DIAGNOSIS — J90 Pleural effusion, not elsewhere classified: Secondary | ICD-10-CM | POA: Diagnosis not present

## 2018-11-29 DIAGNOSIS — Z01810 Encounter for preprocedural cardiovascular examination: Secondary | ICD-10-CM | POA: Diagnosis not present

## 2018-11-29 DIAGNOSIS — Z961 Presence of intraocular lens: Secondary | ICD-10-CM | POA: Diagnosis not present

## 2018-11-29 DIAGNOSIS — J9611 Chronic respiratory failure with hypoxia: Secondary | ICD-10-CM | POA: Diagnosis not present

## 2018-11-29 DIAGNOSIS — Z872 Personal history of diseases of the skin and subcutaneous tissue: Secondary | ICD-10-CM | POA: Diagnosis not present

## 2018-11-29 DIAGNOSIS — D649 Anemia, unspecified: Secondary | ICD-10-CM | POA: Diagnosis not present

## 2018-11-29 DIAGNOSIS — I2510 Atherosclerotic heart disease of native coronary artery without angina pectoris: Secondary | ICD-10-CM | POA: Diagnosis not present

## 2018-11-29 DIAGNOSIS — I340 Nonrheumatic mitral (valve) insufficiency: Secondary | ICD-10-CM | POA: Diagnosis not present

## 2018-11-29 DIAGNOSIS — Z66 Do not resuscitate: Secondary | ICD-10-CM | POA: Diagnosis not present

## 2018-11-29 DIAGNOSIS — Z95810 Presence of automatic (implantable) cardiac defibrillator: Secondary | ICD-10-CM | POA: Diagnosis not present

## 2018-11-29 DIAGNOSIS — J9621 Acute and chronic respiratory failure with hypoxia: Secondary | ICD-10-CM | POA: Diagnosis not present

## 2018-11-29 DIAGNOSIS — I5042 Chronic combined systolic (congestive) and diastolic (congestive) heart failure: Secondary | ICD-10-CM | POA: Diagnosis not present

## 2018-11-29 DIAGNOSIS — I38 Endocarditis, valve unspecified: Secondary | ICD-10-CM | POA: Diagnosis not present

## 2018-11-29 DIAGNOSIS — I255 Ischemic cardiomyopathy: Secondary | ICD-10-CM | POA: Diagnosis not present

## 2018-11-29 DIAGNOSIS — G4733 Obstructive sleep apnea (adult) (pediatric): Secondary | ICD-10-CM | POA: Diagnosis not present

## 2018-11-29 DIAGNOSIS — I130 Hypertensive heart and chronic kidney disease with heart failure and stage 1 through stage 4 chronic kidney disease, or unspecified chronic kidney disease: Secondary | ICD-10-CM | POA: Diagnosis not present

## 2018-11-29 DIAGNOSIS — Z955 Presence of coronary angioplasty implant and graft: Secondary | ICD-10-CM | POA: Diagnosis not present

## 2018-11-29 DIAGNOSIS — I429 Cardiomyopathy, unspecified: Secondary | ICD-10-CM | POA: Diagnosis not present

## 2018-11-29 DIAGNOSIS — I330 Acute and subacute infective endocarditis: Secondary | ICD-10-CM | POA: Diagnosis not present

## 2018-11-29 DIAGNOSIS — I079 Rheumatic tricuspid valve disease, unspecified: Secondary | ICD-10-CM | POA: Diagnosis not present

## 2018-11-29 NOTE — Progress Notes (Signed)
Pt was stable on transfer. [facility] came and pick pt up heading to [facility]. Documents was given to the [employer].

## 2018-11-29 NOTE — Progress Notes (Signed)
[place] [facility] [place] NAME: [name]    MR#:  [id number]  DATE OF BIRTH:  [birth year]  Patient is still waiting for bed at [facility].  CHIEF COMPLAINT:   Patient awaiting transfer to [facility]   REVIEW OF SYSTEMS:    Review of Systems  Constitutional: Negative for chills and fever.  HENT: Negative for hearing loss.   Eyes: Negative for blurred vision, double vision and photophobia.  Respiratory: Negative for cough, hemoptysis and shortness of breath.   Cardiovascular: Negative for palpitations, orthopnea and leg swelling.  Gastrointestinal: Negative for abdominal pain, diarrhea and vomiting.  Genitourinary: Negative for dysuria and urgency.  Musculoskeletal: Negative for myalgias and neck pain.  Skin: Negative for rash.  Neurological: Negative for dizziness, focal weakness, seizures, weakness and headaches.  Psychiatric/Behavioral: Negative for memory loss. The patient does not have insomnia.     Nutrition:  Tolerating Diet: Tolerating PT:      DRUG ALLERGIES:  No Known Allergies  VITALS:  Blood pressure (!) 141/66, pulse 98, temperature 98.8 F (37.1 C), temperature source Oral, resp. rate (!) 22, height 5\' 8"  (1.727 m), weight (!) 137.6 kg, SpO2 96 %.  PHYSICAL EXAMINATION:   Physical Exam  GENERAL:  63 y.o.-year-old patient lying in the bed with no acute distress.  Appears dehydrated EYES: Pupils equal, round, reactive to light . No scleral icterus. Extraocular muscles intact.  HEENT: Head atraumatic, normocephalic. Oropharynx and nasopharynx clear.  Patient mucosa is dry. NECK:  Supple, no jugular venous distention. No thyroid enlargement, no tenderness.  LUNGS: Diminished air entry bilaterally.  CARDIOVASCULAR: S1 S1, S2 regular, patient has systolic murmur grade 2/6, diastolic murmur with a grade 2/6 present   aBDOMEN: Soft, nontender, nondistended. Bowel sounds present. No organomegaly or mass.  EXTREMITIES: No  pedal edema, cyanosis, or clubbing.  NEUROLOGIC:  lethargic, unable to follow commands for full neuro exam this morning. PSYCHIATRIC: The patient is alert and oriented x 3.  SKIN: No obvious rash, lesion, or ulcer.    LABORATORY PANEL:   CBC Recent Labs  Lab 11/26/18 0553  WBC 6.6  HGB 8.8*  HCT 27.8*  PLT 90*   ------------------------------------------------------------------------------------------------------------------  Chemistries  Recent Labs  Lab 11/23/18 0526  11/28/18 0508  NA 135   < > 138  K 3.8   < > 3.4*  CL 99   < > 108  CO2 24   < > 24  GLUCOSE 284*   < > 158*  BUN 65*   < > 52*  CREATININE 3.96*   < > 2.44*  CALCIUM 7.2*   < > 7.7*  MG 1.3*  --   --   AST 105*  --  25  ALT 28  --  18  ALKPHOS 44  --  54  BILITOT 0.7  --  1.2   < > = values in this interval not displayed.   ------------------------------------------------------------------------------------------------------------------  Cardiac Enzymes No results for input(s): TROPONINI in the last 168 hours. ------------------------------------------------------------------------------------------------------------------  RADIOLOGY:  No results found.   ASSESSMENT AND PLAN:  *Acute hypovolemic shock with MOD Resolved Secondary to recurrent staph aureus bacteremia  Most likely due to infected AICD, awaiting transfer to [facility] for AICD removal,  Patient still on the list verified by [facility] called this morning   *Acute recurrent MSSA bacteremia Stable  Infectious disease input greatly appreciated-continue nafcillin  patient needs ICD removal, seen by EP physician Dr. Caryl Comes recommended transfer to tertiary facility, patient  chose [facility], patient accepted at [facility] cardiology/Dr. Enrigue Catena - accepting physician, currently awaiting bed   * Acute kidney injury improving slowly Secondary to sepsis Nephrology input greatly appreciated, continue to avoid nephrotoxic agents  *Acute on chronic anemia of  chronic kidney disease S/p 1 unit of blood transfusion  *Elevated troponins/ non-ST elevation MI Continue aspirin, Plavix, statins  patient with severe CAD, continue supplemental oxygen,  *diabetes mellitus type 2 Stable on current regiment  Disposition to [facility] hospital when bed is available  Condition critical, high risk for cardiac arrest.  Poor prognosis. All the records are reviewed and case discussed with Care Management/Social Workerr. Management plans discussed with the patient, family and they are in agreement.  CODE STATUS: Full code  TOTAL TIME TAKING CARE OF THIS PATIENT:35 minutes.   Patient will go to [facility] when the bed is available.  Discharge summary, instructions, EMTALAare already filled.  Case discussed with cardiology, intensivist. Dustin Flock M.D on 11/29/2018 at 2:11 PM  Between 7am to 6pm - Pager - [phone]  After 6pm go to [email or web address] - password [email or web address] [place] [facility]  Office  [phone]  CC: Primary care physician; Rutherford Guys, MD

## 2018-11-30 ENCOUNTER — Ambulatory Visit: Payer: PPO | Admitting: Neurology

## 2018-11-30 LAB — CULTURE, BLOOD (ROUTINE X 2)
CULTURE: NO GROWTH
CULTURE: NO GROWTH
Special Requests: ADEQUATE
Special Requests: ADEQUATE

## 2018-12-04 LAB — BLOOD GAS, ARTERIAL
Acid-base deficit: 2.6 mmol/L — ABNORMAL HIGH (ref 0.0–2.0)
Bicarbonate: 23.7 mmol/L (ref 20.0–28.0)
FIO2: 0.36
O2 SAT: 44 %
Patient temperature: 37
pCO2 arterial: 46 mmHg (ref 32.0–48.0)
pH, Arterial: 7.32 — ABNORMAL LOW (ref 7.350–7.450)

## 2018-12-08 MED ORDER — SPIRONOLACTONE 25 MG PO TABS
12.50 | ORAL_TABLET | ORAL | Status: DC
Start: 2018-12-09 — End: 2018-12-08

## 2018-12-08 MED ORDER — ASPIRIN 81 MG PO CHEW
81.00 | CHEWABLE_TABLET | ORAL | Status: DC
Start: 2018-12-09 — End: 2018-12-08

## 2018-12-08 MED ORDER — DEXTROSE 10 % IV SOLN
12.50 | INTRAVENOUS | Status: DC
Start: ? — End: 2018-12-08

## 2018-12-08 MED ORDER — TORSEMIDE 20 MG PO TABS
40.00 | ORAL_TABLET | ORAL | Status: DC
Start: 2018-12-08 — End: 2018-12-08

## 2018-12-08 MED ORDER — MAGNESIUM OXIDE 400 MG PO TABS
400.00 | ORAL_TABLET | ORAL | Status: DC
Start: 2018-12-09 — End: 2018-12-08

## 2018-12-08 MED ORDER — INSULIN GLARGINE 100 UNIT/ML ~~LOC~~ SOLN
14.00 | SUBCUTANEOUS | Status: DC
Start: 2018-12-08 — End: 2018-12-08

## 2018-12-08 MED ORDER — RIFAMPIN 300 MG PO CAPS
300.00 | ORAL_CAPSULE | ORAL | Status: DC
Start: 2018-12-08 — End: 2018-12-08

## 2018-12-08 MED ORDER — INSULIN LISPRO 100 UNIT/ML ~~LOC~~ SOLN
0.00 | SUBCUTANEOUS | Status: DC
Start: 2018-12-08 — End: 2018-12-08

## 2018-12-08 MED ORDER — CITALOPRAM HYDROBROMIDE 20 MG PO TABS
20.00 | ORAL_TABLET | ORAL | Status: DC
Start: 2018-12-09 — End: 2018-12-08

## 2018-12-08 MED ORDER — FERROUS SULFATE 325 (65 FE) MG PO TABS
325.00 | ORAL_TABLET | ORAL | Status: DC
Start: 2018-12-08 — End: 2018-12-08

## 2018-12-08 MED ORDER — ATORVASTATIN CALCIUM 80 MG PO TABS
80.00 | ORAL_TABLET | ORAL | Status: DC
Start: 2018-12-08 — End: 2018-12-08

## 2018-12-08 MED ORDER — LACTATED RINGERS IV SOLN
INTRAVENOUS | Status: DC
Start: ? — End: 2018-12-08

## 2018-12-08 MED ORDER — ALBUTEROL SULFATE (2.5 MG/3ML) 0.083% IN NEBU
2.50 | INHALATION_SOLUTION | RESPIRATORY_TRACT | Status: DC
Start: ? — End: 2018-12-08

## 2018-12-08 MED ORDER — GABAPENTIN 300 MG PO CAPS
300.00 | ORAL_CAPSULE | ORAL | Status: DC
Start: 2018-12-08 — End: 2018-12-08

## 2018-12-08 MED ORDER — CARVEDILOL 6.25 MG PO TABS
6.25 | ORAL_TABLET | ORAL | Status: DC
Start: 2018-12-08 — End: 2018-12-08

## 2018-12-08 MED ORDER — DOCUSATE SODIUM 100 MG PO CAPS
100.00 | ORAL_CAPSULE | ORAL | Status: DC
Start: 2018-12-08 — End: 2018-12-08

## 2018-12-08 MED ORDER — ENOXAPARIN SODIUM 40 MG/0.4ML ~~LOC~~ SOLN
40.00 | SUBCUTANEOUS | Status: DC
Start: 2018-12-08 — End: 2018-12-08

## 2018-12-08 MED ORDER — CEFAZOLIN SODIUM-DEXTROSE 2-3 GM-%(50ML) IV SOLR
2.00 | INTRAVENOUS | Status: DC
Start: 2018-12-08 — End: 2018-12-08

## 2018-12-08 MED ORDER — LOPERAMIDE HCL 2 MG PO CAPS
2.00 | ORAL_CAPSULE | ORAL | Status: DC
Start: ? — End: 2018-12-08

## 2018-12-08 MED ORDER — ACETAMINOPHEN 325 MG PO TABS
650.00 | ORAL_TABLET | ORAL | Status: DC
Start: ? — End: 2018-12-08

## 2018-12-08 MED ORDER — LOSARTAN POTASSIUM 25 MG PO TABS
50.00 | ORAL_TABLET | ORAL | Status: DC
Start: 2018-12-09 — End: 2018-12-08

## 2018-12-08 NOTE — Progress Notes (Deleted)
Patient ID: [name], female    DOB: 1956-09-15, 63 y.o.   MRN: [id number]  HPI  [name] is a 63 y/o female with a history of CAD, DM, hyperlipidemia, HTN, CKD, anemia, vitamin D deficiency, obstructive sleep apnea, osteomyelitis, depression and chronic heart failure.   TEE done 11/29/2018 revealing large mobile vegetation present on anterior leaflet and septal leaflet of tricuspid valve with moderate TR. Echo report from 11/23/2018 reviewed and showed an EF of 40-45%. Echo report from 10/02/18 reviewed and showed an EF of 45-50% along with mild MR and trivial AR but without vegetation.   Cardiac catheterization done 10/05/18 showed:   Medical management was recommended.   Prox Cx to Dist Cx lesion is 80% stenosed.  Prox LAD-1 lesion is 40% stenosed.  Prox LAD-2 lesion is 90% stenosed.  Prox LAD to Mid LAD lesion is 80% stenosed.  Mid LAD to Dist LAD lesion is 70% stenosed.  1st Diag lesion is 90% stenosed.  Ost 1st Diag lesion is 80% stenosed.  Mid RCA-1 lesion is 40% stenosed.  Mid RCA-2 lesion is 40% stenosed.  Admitted 11/29/2018 [facility]) due to MSSA and consideration for AICD lead removal as well as possible device removal. Cardiology consult obtained.                 Admitted 11/22/2018 due to altered mental status and NSTEMI. Given IV fluids and antibiotics due to sepsis.Cardiology and Infectious disease consults obtained. Given 2 units of blood due to anemia. Elevated troponin thought to be due to demand ischemia. Blood cultures positive for MSSA. Consideration of AICD removal. Transferred to [facility] after 7 days. Admitted 10/30/2018 due to acute on chronic HF. Diuresed with IV lasix and then transitioned to oral diuretics. Cardiology consult was obtained. Continues on antibiotics via her PICC line. Weaned off oxygen. Discharged after 5 days. Was in the ED 10/27/2018 due to leg swelling, shortness of breath and anemia. Reported 36 pound weight gain since recent hospital  discharge. Given IV lasix with good urine output and she was released. Admitted 10/12/2018 due to pneumonia. Palliative care and wound consults obtained. Treated with antibiotics. Given IV fluids due to acute kidney injury.Recent MSSA bacteremia on 6 weeks of IV Ancef. Discharged after 4 days.   She presents today for a follow-up visit with a chief complaint of   Past Medical History:  Diagnosis Date  . Acute myocardial infarction, subendocardial infarction, initial episode of care ([place]) 07/21/2012  . Acute osteomyelitis involving ankle and foot ([place]) 02/06/2015  . Acute systolic heart failure ([place]) 07/21/2012   New onset 07/19/12; admission to [facility] ED. Elevated Troponins.  S/p 2D-echo with EF 20-25%.  S/p cardiac catheterization with stenting LAD.  Repeat 2D-echo 10/2011 with improved EF of 35%.   . Anemia   . Automatic implantable cardioverter-defibrillator in situ    a. MDT CRT-D 06/2014, SN: [id number]    . CAD (coronary artery disease)    a. cardiac cath 101/04/2012: PCI/DES to chronically occluded mLAD, consideration PCI to diag branch in 4 weeks.   . Cataract   . Chicken pox   . Chronic kidney disease   . Chronic systolic CHF (congestive heart failure) (HCC)    a. mixed ICM & NICM; b. EF 20-25% by echo 07/2012, mid-dist 2/3 of LV sev HK/AK, mild MR. echo 10/2012: EF 30-35%, sev HK ant-septal & inf walls, GR1DD, mild MR, PASP 33. c. echo 02/2013: EF 30%, GR1DD, mild MR. echo 04/2014: EF 30%, Septal-lat dyssynchrony,  global HK, inf AK, GR1DD, mild MR. d. echo 10/2014: EF50-55%, WM nl, GR1DD, septal mild paradox. e. echo 02/2015: EF 50-55%, wm   . Depression   . Heart attack ([place])   . Heel ulcer ([place]) 04/27/2015  . History of blood transfusion ~ 2011   "plasma; had neuropathy; couldn't walk"  . Hypertension   . LBBB (left bundle branch block)   . Neuromuscular disorder ([place])   . Neuropathy 2011  . Obesity, unspecified   . OSA on CPAP    Moderate with AHI 23/hr and now on CPAP at 16cm H2O.   Her DME is [facility]  . Pure hypercholesterolemia   . Type II diabetes mellitus ([place])   . Unspecified vitamin D deficiency    Past Surgical History:  Procedure Laterality Date  . AMPUTATION TOE Right 06/18/2015   Procedure: AMPUTATION TOE;  Surgeon: Samara Deist, DPM;  Location: [facility] ORS;  Service: Podiatry;  Laterality: Right;  . AMPUTATION TOE Left 10/08/2018   Procedure: AMPUTATION TOE LEFT 2ND;  Surgeon: Albertine Patricia, DPM;  Location: [facility] ORS;  Service: Podiatry;  Laterality: Left;  . BACK SURGERY    . BI-VENTRICULAR IMPLANTABLE CARDIOVERTER DEFIBRILLATOR N/A 07/06/2014   Procedure: BI-VENTRICULAR IMPLANTABLE CARDIOVERTER DEFIBRILLATOR  (CRT-D);  Surgeon: Deboraha Sprang, MD;  Location: [facility] CATH LAB;  Service: Cardiovascular;  Laterality: N/A;  . BI-VENTRICULAR IMPLANTABLE CARDIOVERTER DEFIBRILLATOR  (CRT-D)  07/06/2014  . BILATERAL OOPHORECTOMY  01/2011   ovarian cyst benign  . COLONOSCOPY WITH PROPOFOL Left 02/22/2015   Procedure: COLONOSCOPY WITH PROPOFOL;  Surgeon: Hulen Luster, MD;  Location: [facility] ENDOSCOPY;  Service: Endoscopy;  Laterality: Left;  . CORONARY ANGIOPLASTY WITH STENT PLACEMENT Left 07/2012   new onset systolic CHF; elevated troponins.  Cardiac catheterization with stenting to LAD; EF 15%.  2D-echo: EF 20-25%.  . ESOPHAGOGASTRODUODENOSCOPY N/A 02/22/2015   Procedure: ESOPHAGOGASTRODUODENOSCOPY (EGD);  Surgeon: Hulen Luster, MD;  Location: [facility] ENDOSCOPY;  Service: Endoscopy;  Laterality: N/A;  . INCISION AND DRAINAGE ABSCESS Right 2007   groin; with ICU stay due to sepsis.  Marland Kitchen LAPAROSCOPIC CHOLECYSTECTOMY  2011  . LEFT HEART CATH AND CORONARY ANGIOGRAPHY N/A 10/05/2018   Procedure: LEFT HEART CATH AND CORONARY ANGIOGRAPHY;  Surgeon: Minna Merritts, MD;  Location: [place] CV LAB;  Service: Cardiovascular;  Laterality: N/A;  . LEFT HEART CATHETERIZATION WITH CORONARY ANGIOGRAM N/A 07/21/2012   Procedure: LEFT HEART CATHETERIZATION WITH CORONARY ANGIOGRAM;  Surgeon: Jolaine Artist, MD;  Location: [facility] CATH LAB;  Service: Cardiovascular;  Laterality: N/A;  . [place]   L4-5  . PERCUTANEOUS CORONARY STENT INTERVENTION (PCI-S) N/A 07/23/2012   Procedure: PERCUTANEOUS CORONARY STENT INTERVENTION (PCI-S);  Surgeon: Sherren Mocha, MD;  Location: [facility] CATH LAB;  Service: Cardiovascular;  Laterality: N/A;  . PERIPHERAL VASCULAR BALLOON ANGIOPLASTY Left 10/06/2018   Procedure: PERIPHERAL VASCULAR BALLOON ANGIOPLASTY;  Surgeon: Katha Cabal, MD;  Location: [place] CV LAB;  Service: Cardiovascular;  Laterality: Left;  . PERIPHERAL VASCULAR CATHETERIZATION N/A 02/10/2015   Procedure: Picc Line Insertion;  Surgeon: Katha Cabal, MD;  Location: [place] CV LAB;  Service: Cardiovascular;  Laterality: N/A;  . TEE WITHOUT CARDIOVERSION N/A 10/02/2018   Procedure: TRANSESOPHAGEAL ECHOCARDIOGRAM (TEE);  Surgeon: Wellington Hampshire, MD;  Location: [facility] ORS;  Service: Cardiovascular;  Laterality: N/A;  . [place]  . VAGINAL HYSTERECTOMY  01/2011   Fibroids/DUB.  Ovaries removed. [place].   Family History  Problem Relation Age of Onset  .  Diabetes Mother   . Hypertension Mother   . Arthritis Mother        knees, lumbar DDD, cervical DDD  . Cancer Father        prostate,skin,lymphoma.  . Cancer Brother 85       bladder cancer; non-smoker  . Diabetes Maternal Grandmother   . Heart disease Maternal Grandmother   . COPD Maternal Grandmother   . Diabetes Paternal Grandmother   . Obesity Brother   . Diabetes Son   . Hypertension Son   . Cancer Maternal Grandfather   . Diabetes Paternal Grandfather    Social History   Tobacco Use  . Smoking status: Never Smoker  . Smokeless tobacco: Never Used  Substance Use Topics  . Alcohol use: No    Alcohol/week: 0.0 standard drinks   No Known Allergies    Review of Systems  Constitutional: Positive for fatigue (easily). Negative for appetite change.  HENT: Positive for  congestion. Negative for rhinorrhea and sore throat.   Eyes: Negative.   Respiratory: Positive for cough. Negative for shortness of breath and wheezing.   Cardiovascular: Positive for leg swelling (better). Negative for chest pain and palpitations.  Gastrointestinal: Negative for abdominal distention and abdominal pain.  Endocrine: Negative.   Genitourinary: Negative.   Musculoskeletal: Positive for back pain. Negative for neck pain.  Skin: Positive for wound (left 2nd toe amputated).  Allergic/Immunologic: Negative.   Neurological: Negative for dizziness and light-headedness.  Hematological: Negative for adenopathy. Does not bruise/bleed easily.  Psychiatric/Behavioral: Positive for sleep disturbance (sleeping on 1 pillow with oxygen @ 2L). Negative for dysphoric mood. The patient is not nervous/anxious.      Physical Exam Vitals signs and nursing note reviewed.  Constitutional:      Appearance: She is well-developed.  HENT:     Head: Normocephalic and atraumatic.  Neck:     Musculoskeletal: Normal range of motion and neck supple.  Cardiovascular:     Rate and Rhythm: Normal rate and regular rhythm.  Pulmonary:     Effort: Pulmonary effort is normal. No respiratory distress.     Breath sounds: No rhonchi or rales.  Abdominal:     General: There is no distension.     Palpations: Abdomen is soft.  Musculoskeletal:     Right lower leg: She exhibits no tenderness. Edema (3+ pitting although softer) present.     Left lower leg: She exhibits no tenderness. Edema (3+ pitting although softer) present.  Skin:    General: Skin is warm and dry.  Neurological:     General: No focal deficit present.     Mental Status: She is alert and oriented to person, place, and time.  Psychiatric:        Mood and Affect: Mood normal.        Behavior: Behavior normal.    Assessment & Plan:  1: Chronic heart failure with mildly reduced ejection fraction- - NYHA class III - fluid overloaded  today but seems to feel better - weighing daily and she was instructed to call for an overnight weight gain of >2 pounds or a weekly weight gain of >5 pounds - weight  - doesn't add salt to her food (except tomatoes) but admits that she often eats salty foods. When her daughter does the cooking, she will fix macaroni and cheese, fried weenies etc and patient is aware that those foods are higher in sodium - saw EP Caryl Comes) 10/27/2018  - sees cardiology ([place])  - BNP 11/22/2018 was  2246.0 - she says that she's received her flu vaccine for this season - wears oxygen @ 2L at bedtime  2: HTN- - BP  - saw PCP Pamella Pert) 11/09/2018 - BMP from 11/28/2018 reviewed and showed sodium 138, potassium 3.4, creatinine 2.44 and GFR 20  3: DM- - A1c 10/22/2018 was 7.8% - glucose today was   4: Lymphedema- - stage 2 - does elevate her legs but says that her edema continues - has tried wearing support socks but says that they cut into her leg behind her knee causing pain - currently unable to exercise due to symptoms - consider lymphapress compression boots if symptoms persists; will need to wait the 30 days for insurance approval (1st visit was 10/28/2018)  Medication list was reviewed.

## 2018-12-09 ENCOUNTER — Ambulatory Visit: Payer: PPO | Admitting: Family

## 2018-12-14 ENCOUNTER — Inpatient Hospital Stay
Admission: AD | Admit: 2018-12-14 | Discharge: 2018-12-16 | DRG: 867 | Disposition: A | Discharge status: Home Health | Payer: PPO | Source: Other Acute Inpatient Hospital | Attending: Internal Medicine | Admitting: Internal Medicine

## 2018-12-14 ENCOUNTER — Other Ambulatory Visit: Payer: Self-pay

## 2018-12-14 ENCOUNTER — Inpatient Hospital Stay: Payer: Self-pay

## 2018-12-14 DIAGNOSIS — Z89421 Acquired absence of other right toe(s): Secondary | ICD-10-CM

## 2018-12-14 DIAGNOSIS — R0600 Dyspnea, unspecified: Secondary | ICD-10-CM | POA: Diagnosis not present

## 2018-12-14 DIAGNOSIS — D696 Thrombocytopenia, unspecified: Secondary | ICD-10-CM | POA: Diagnosis present

## 2018-12-14 DIAGNOSIS — T827XXA Infection and inflammatory reaction due to other cardiac and vascular devices, implants and grafts, initial encounter: Secondary | ICD-10-CM | POA: Diagnosis not present

## 2018-12-14 DIAGNOSIS — D649 Anemia, unspecified: Secondary | ICD-10-CM | POA: Diagnosis not present

## 2018-12-14 DIAGNOSIS — F329 Major depressive disorder, single episode, unspecified: Secondary | ICD-10-CM | POA: Diagnosis not present

## 2018-12-14 DIAGNOSIS — Z95810 Presence of automatic (implantable) cardiac defibrillator: Secondary | ICD-10-CM | POA: Diagnosis not present

## 2018-12-14 DIAGNOSIS — N179 Acute kidney failure, unspecified: Secondary | ICD-10-CM | POA: Diagnosis present

## 2018-12-14 DIAGNOSIS — I252 Old myocardial infarction: Secondary | ICD-10-CM | POA: Diagnosis not present

## 2018-12-14 DIAGNOSIS — I255 Ischemic cardiomyopathy: Secondary | ICD-10-CM | POA: Diagnosis present

## 2018-12-14 DIAGNOSIS — B9561 Methicillin susceptible Staphylococcus aureus infection as the cause of diseases classified elsewhere: Secondary | ICD-10-CM | POA: Diagnosis not present

## 2018-12-14 DIAGNOSIS — E7800 Pure hypercholesterolemia, unspecified: Secondary | ICD-10-CM | POA: Diagnosis present

## 2018-12-14 DIAGNOSIS — Z872 Personal history of diseases of the skin and subcutaneous tissue: Secondary | ICD-10-CM | POA: Diagnosis not present

## 2018-12-14 DIAGNOSIS — I38 Endocarditis, valve unspecified: Secondary | ICD-10-CM | POA: Diagnosis not present

## 2018-12-14 DIAGNOSIS — E1169 Type 2 diabetes mellitus with other specified complication: Secondary | ICD-10-CM | POA: Diagnosis not present

## 2018-12-14 DIAGNOSIS — E611 Iron deficiency: Secondary | ICD-10-CM | POA: Diagnosis not present

## 2018-12-14 DIAGNOSIS — Z89422 Acquired absence of other left toe(s): Secondary | ICD-10-CM | POA: Diagnosis not present

## 2018-12-14 DIAGNOSIS — N183 Chronic kidney disease, stage 3 (moderate): Secondary | ICD-10-CM | POA: Diagnosis not present

## 2018-12-14 DIAGNOSIS — A4901 Methicillin susceptible Staphylococcus aureus infection, unspecified site: Secondary | ICD-10-CM | POA: Diagnosis not present

## 2018-12-14 DIAGNOSIS — Z8619 Personal history of other infectious and parasitic diseases: Secondary | ICD-10-CM | POA: Diagnosis not present

## 2018-12-14 DIAGNOSIS — I447 Left bundle-branch block, unspecified: Secondary | ICD-10-CM | POA: Diagnosis present

## 2018-12-14 DIAGNOSIS — Z8249 Family history of ischemic heart disease and other diseases of the circulatory system: Secondary | ICD-10-CM | POA: Diagnosis not present

## 2018-12-14 DIAGNOSIS — I429 Cardiomyopathy, unspecified: Secondary | ICD-10-CM | POA: Diagnosis not present

## 2018-12-14 DIAGNOSIS — I330 Acute and subacute infective endocarditis: Secondary | ICD-10-CM | POA: Diagnosis present

## 2018-12-14 DIAGNOSIS — M869 Osteomyelitis, unspecified: Secondary | ICD-10-CM | POA: Diagnosis present

## 2018-12-14 DIAGNOSIS — D500 Iron deficiency anemia secondary to blood loss (chronic): Secondary | ICD-10-CM | POA: Diagnosis not present

## 2018-12-14 DIAGNOSIS — I5042 Chronic combined systolic (congestive) and diastolic (congestive) heart failure: Secondary | ICD-10-CM | POA: Diagnosis present

## 2018-12-14 DIAGNOSIS — I420 Dilated cardiomyopathy: Secondary | ICD-10-CM | POA: Diagnosis not present

## 2018-12-14 DIAGNOSIS — Z833 Family history of diabetes mellitus: Secondary | ICD-10-CM | POA: Diagnosis not present

## 2018-12-14 DIAGNOSIS — Z794 Long term (current) use of insulin: Secondary | ICD-10-CM

## 2018-12-14 DIAGNOSIS — Z6841 Body Mass Index (BMI) 40.0 and over, adult: Secondary | ICD-10-CM | POA: Diagnosis not present

## 2018-12-14 DIAGNOSIS — Z66 Do not resuscitate: Secondary | ICD-10-CM | POA: Diagnosis not present

## 2018-12-14 DIAGNOSIS — I2510 Atherosclerotic heart disease of native coronary artery without angina pectoris: Secondary | ICD-10-CM | POA: Diagnosis present

## 2018-12-14 DIAGNOSIS — Z9889 Other specified postprocedural states: Secondary | ICD-10-CM | POA: Diagnosis not present

## 2018-12-14 DIAGNOSIS — I428 Other cardiomyopathies: Secondary | ICD-10-CM | POA: Diagnosis present

## 2018-12-14 DIAGNOSIS — I130 Hypertensive heart and chronic kidney disease with heart failure and stage 1 through stage 4 chronic kidney disease, or unspecified chronic kidney disease: Secondary | ICD-10-CM | POA: Diagnosis not present

## 2018-12-14 DIAGNOSIS — I5022 Chronic systolic (congestive) heart failure: Secondary | ICD-10-CM | POA: Diagnosis not present

## 2018-12-14 DIAGNOSIS — Z955 Presence of coronary angioplasty implant and graft: Secondary | ICD-10-CM

## 2018-12-14 DIAGNOSIS — R7881 Bacteremia: Secondary | ICD-10-CM | POA: Diagnosis not present

## 2018-12-14 DIAGNOSIS — E662 Morbid (severe) obesity with alveolar hypoventilation: Secondary | ICD-10-CM | POA: Diagnosis present

## 2018-12-14 DIAGNOSIS — Z7902 Long term (current) use of antithrombotics/antiplatelets: Secondary | ICD-10-CM

## 2018-12-14 DIAGNOSIS — Z7982 Long term (current) use of aspirin: Secondary | ICD-10-CM

## 2018-12-14 DIAGNOSIS — Z79899 Other long term (current) drug therapy: Secondary | ICD-10-CM

## 2018-12-14 DIAGNOSIS — Z8261 Family history of arthritis: Secondary | ICD-10-CM

## 2018-12-14 DIAGNOSIS — I25118 Atherosclerotic heart disease of native coronary artery with other forms of angina pectoris: Secondary | ICD-10-CM | POA: Diagnosis not present

## 2018-12-14 LAB — CBC
HEMATOCRIT: 26.5 % — AB (ref 36.0–46.0)
Hemoglobin: 8.1 g/dL — ABNORMAL LOW (ref 12.0–15.0)
MCH: 28.5 pg (ref 26.0–34.0)
MCHC: 30.6 g/dL (ref 30.0–36.0)
MCV: 93.3 fL (ref 80.0–100.0)
Platelets: 203 10*3/uL (ref 150–400)
RBC: 2.84 MIL/uL — ABNORMAL LOW (ref 3.87–5.11)
RDW: 16.9 % — ABNORMAL HIGH (ref 11.5–15.5)
WBC: 5.3 10*3/uL (ref 4.0–10.5)
nRBC: 0 % (ref 0.0–0.2)

## 2018-12-14 LAB — BASIC METABOLIC PANEL
Anion gap: 12 (ref 5–15)
BUN: 28 mg/dL — ABNORMAL HIGH (ref 8–23)
CHLORIDE: 95 mmol/L — AB (ref 98–111)
CO2: 28 mmol/L (ref 22–32)
Calcium: 8 mg/dL — ABNORMAL LOW (ref 8.9–10.3)
Creatinine, Ser: 1.18 mg/dL — ABNORMAL HIGH (ref 0.44–1.00)
GFR calc Af Amer: 56 mL/min — ABNORMAL LOW (ref >60)
GFR calc non Af Amer: 49 mL/min — ABNORMAL LOW (ref >60)
Glucose, Bld: 152 mg/dL — ABNORMAL HIGH (ref 70–99)
Potassium: 4.4 mmol/L (ref 3.5–5.1)
Sodium: 135 mmol/L (ref 135–145)

## 2018-12-14 LAB — PROTIME-INR
INR: 1.1 (ref 0.8–1.2)
Prothrombin Time: 13.8 seconds (ref 11.4–15.2)

## 2018-12-14 LAB — CREATININE, SERUM
Creatinine, Ser: 1.14 mg/dL — ABNORMAL HIGH (ref 0.44–1.00)
GFR calc Af Amer: 59 mL/min — ABNORMAL LOW (ref >60)
GFR calc non Af Amer: 51 mL/min — ABNORMAL LOW (ref >60)

## 2018-12-14 LAB — GLUCOSE, CAPILLARY: Glucose-Capillary: 178 mg/dL — ABNORMAL HIGH (ref 70–99)

## 2018-12-14 MED ORDER — ENOXAPARIN SODIUM 40 MG/0.4ML ~~LOC~~ SOLN: 40.0000 mg | SUBCUTANEOUS | Status: DC

## 2018-12-14 MED ORDER — ALBUTEROL SULFATE (2.5 MG/3ML) 0.083% IN NEBU: 2.5000 mg | INHALATION_SOLUTION | RESPIRATORY_TRACT | Status: DC | PRN

## 2018-12-14 MED ORDER — INSULIN GLARGINE 100 UNIT/ML ~~LOC~~ SOLN
16.0000 [IU] | Freq: Every day | SUBCUTANEOUS | Status: DC
  Administered 2018-12-14 – 2018-12-15 (×2): 16 [IU] via SUBCUTANEOUS
  Filled 2018-12-14 (×3): qty 0.16

## 2018-12-14 MED ORDER — CEFAZOLIN SODIUM-DEXTROSE 2-4 GM/100ML-% IV SOLN
2.0000 g | Freq: Three times a day (TID) | INTRAVENOUS | Status: DC
  Administered 2018-12-14 – 2018-12-16 (×6): 2 g via INTRAVENOUS
  Filled 2018-12-14 (×9): qty 100

## 2018-12-14 MED ORDER — ACETAMINOPHEN 325 MG PO TABS
650.0000 mg | ORAL_TABLET | Freq: Four times a day (QID) | ORAL | Status: DC | PRN
  Administered 2018-12-16: 650 mg via ORAL
  Filled 2018-12-14: qty 2

## 2018-12-14 MED ORDER — SENNOSIDES-DOCUSATE SODIUM 8.6-50 MG PO TABS: 1.0000 | ORAL_TABLET | Freq: Every evening | ORAL | Status: DC | PRN

## 2018-12-14 MED ORDER — INSULIN ASPART 100 UNIT/ML ~~LOC~~ SOLN
0.0000 [IU] | Freq: Three times a day (TID) | SUBCUTANEOUS | Status: DC
  Administered 2018-12-15: 2 [IU] via SUBCUTANEOUS
  Administered 2018-12-15: 11 [IU] via SUBCUTANEOUS
  Administered 2018-12-15 – 2018-12-16 (×2): 2 [IU] via SUBCUTANEOUS
  Administered 2018-12-16: 5 [IU] via SUBCUTANEOUS
  Filled 2018-12-14 (×5): qty 1

## 2018-12-14 MED ORDER — INSULIN ASPART 100 UNIT/ML ~~LOC~~ SOLN: 0.0000 [IU] | Freq: Every day | SUBCUTANEOUS | Status: DC

## 2018-12-14 MED ORDER — ACETAMINOPHEN 650 MG RE SUPP: 650.0000 mg | Freq: Four times a day (QID) | RECTAL | Status: DC | PRN

## 2018-12-14 MED ORDER — HYDROCODONE-ACETAMINOPHEN 5-325 MG PO TABS: 1.0000 | ORAL_TABLET | ORAL | Status: DC | PRN

## 2018-12-14 MED ORDER — SODIUM CHLORIDE 0.9 % IV SOLN: 250.0000 mL | INTRAVENOUS | Status: DC | PRN

## 2018-12-14 MED ORDER — SODIUM CHLORIDE 0.9% FLUSH: 3.0000 mL | INTRAVENOUS | Status: DC | PRN

## 2018-12-14 MED ORDER — SODIUM CHLORIDE 0.9% FLUSH
3.0000 mL | Freq: Two times a day (BID) | INTRAVENOUS | Status: DC
  Administered 2018-12-14 – 2018-12-16 (×4): 3 mL via INTRAVENOUS

## 2018-12-14 MED ORDER — ONDANSETRON HCL 4 MG/2ML IJ SOLN: 4.0000 mg | Freq: Four times a day (QID) | INTRAMUSCULAR | Status: DC | PRN

## 2018-12-14 MED ORDER — ONDANSETRON HCL 4 MG PO TABS: 4.0000 mg | ORAL_TABLET | Freq: Four times a day (QID) | ORAL | Status: DC | PRN

## 2018-12-14 MED ORDER — HEPARIN SODIUM (PORCINE) 5000 UNIT/ML IJ SOLN
5000.0000 [IU] | Freq: Three times a day (TID) | INTRAMUSCULAR | Status: DC
  Administered 2018-12-14 – 2018-12-16 (×5): 5000 [IU] via SUBCUTANEOUS
  Filled 2018-12-14 (×5): qty 1

## 2018-12-14 NOTE — Progress Notes (Signed)
Pharmacy Antibiotic Note  [name] is a 63 y.o. female admitted on 12/14/2018 with endocarditis.  Pharmacy has been consulted for cefazolin dosing.  Plan: Cefazolin 2gm every 8 hours for 6 weeks  Height: 5\' 8"  (172.7 cm) Weight: 276 lb 3 oz (125.3 kg) IBW/kg (Calculated) : 63.9  No data recorded.  Recent Labs  Lab 12/14/18 1838  WBC 5.3    Estimated Creatinine Clearance: 32.5 mL/min (A) (by C-G formula based on SCr of 2.44 mg/dL (H)).    No Known Allergies  Antimicrobials this admission: 3/9 Cefazolin >>   Dose adjustments this admission: For CrCl 35-54 doses are administer every 8 hours   Thank you for allowing pharmacy to be a part of this patient's care.  [name] 12/14/2018 7:10 PM

## 2018-12-14 NOTE — Plan of Care (Signed)
  Problem: Education: Goal: Knowledge of General Education information will improve Description: Including pain rating scale, medication(s)/side effects and non-pharmacologic comfort measures Outcome: Progressing   Problem: Activity: Goal: Risk for activity intolerance will decrease Outcome: Progressing   

## 2018-12-14 NOTE — Progress Notes (Signed)
Pt arrived to the floor via EMS from [facility], md notified, awaiting orders.

## 2018-12-14 NOTE — Progress Notes (Signed)
Advanced care plan. Purpose of the Encounter: CODE STATUS Parties in Attendance: Patient Patient's Decision Capacity: Good Subjective/Patient's story: [name]  is a 63 y.o. female with a known history of recurrent MSSA bacteremia, left second toe osteomyelitis, chronic systolic heart failure, ICD pacemaker, CAD, CKD was transferred from [facility] today.  Patient was transferred from [facility] health to [facility] on February 22 for ICD removal in the setting of MSSA bacteremia.  The MSSA bacteremia source is thought to be from second toe osteomyelitis left side.  [facility] review records have been reviewed.  ICD has been excised.  Repeat TEE on 11/30/2018 in [facility] showed a large mobile vegetation on anterior leaflet and septal leaflet of tricuspid valve.  On 12/04/2018 tricuspid valve vegetation and vegetation on the right ventricular lead have been removed.  MRI thoracic and lumbar spine on February 26 shows no discitis or epidural abscess.  Ophthalmic examination does not show any endophthalmitis.  Angiogram of vegetations on February 28.  Pacemaker ICD have been excised.  Repeat blood cultures are negative. Objective/Medical story Patient transferred from [facility].  Needs access for prolonged IV antibiotic therapy.  No fever Goals of care determination:  Goals of care treatment plan discussed with patient in detail She is full resuscitation for now CODE STATUS: Full code Time spent discussing advanced care planning: 16 minutes

## 2018-12-14 NOTE — H&P (Signed)
[place] [facility] [place] NAME: [name]    MR#:  [id number]  DATE OF BIRTH:  October 18, 1955  DATE OF ADMISSION:  12/14/2018  PRIMARY CARE PHYSICIAN: Rutherford Guys, MD   REQUESTING/REFERRING PHYSICIAN:   CHIEF COMPLAINT: Transfer from [facility] for IV antibiotics for MSSA bacteremia and endocarditis  HISTORY OF PRESENT ILLNESS: [name]  is a 63 y.o. female with a known history of recurrent MSSA bacteremia, left second toe osteomyelitis, chronic systolic heart failure, ICD pacemaker, CAD, CKD was transferred from [facility] today.  Patient was transferred from [facility] health to [facility] on February 22 for ICD removal in the setting of MSSA bacteremia.  The MSSA bacteremia source is thought to be from second toe osteomyelitis left side.  [facility] review records have been reviewed.  ICD has been excised.  Repeat TEE on 11/30/2018 in [facility] showed a large mobile vegetation on anterior leaflet and septal leaflet of tricuspid valve.  On 12/04/2018 tricuspid valve vegetation and vegetation on the right ventricular lead have been removed.  MRI thoracic and lumbar spine on February 26 shows no discitis or epidural abscess.  Ophthalmic examination does not show any endophthalmitis.  Angiogram of vegetations on February 28.  Pacemaker ICD have been excised.  Repeat blood cultures are negative.  Plan per infectious disease attending from [facility] his IV cefazolin antibiotic for another [redacted] weeks along with rifampin.  Patient has been transferred to [facility].  No complaints of any shortness of breath, fever.  No complaints of chest pain.  PAST MEDICAL HISTORY:   Past Medical History:  Diagnosis Date  . Acute myocardial infarction, subendocardial infarction, initial episode of care ([place]) 07/21/2012  . Acute osteomyelitis involving ankle and foot ([place]) 02/06/2015  . Acute systolic heart failure ([place])  07/21/2012   New onset 07/19/12; admission to [facility] ED. Elevated Troponins.  S/p 2D-echo with EF 20-25%.  S/p cardiac catheterization with stenting LAD.  Repeat 2D-echo 10/2011 with improved EF of 35%.   . Anemia   . Automatic implantable cardioverter-defibrillator in situ    a. MDT CRT-D 06/2014, SN: [id number]    . CAD (coronary artery disease)    a. cardiac cath 101/04/2012: PCI/DES to chronically occluded mLAD, consideration PCI to diag branch in 4 weeks.   . Cataract   . Chicken pox   . Chronic kidney disease   . Chronic systolic CHF (congestive heart failure) (HCC)    a. mixed ICM & NICM; b. EF 20-25% by echo 07/2012, mid-dist 2/3 of LV sev HK/AK, mild MR. echo 10/2012: EF 30-35%, sev HK ant-septal & inf walls, GR1DD, mild MR, PASP 33. c. echo 02/2013: EF 30%, GR1DD, mild MR. echo 04/2014: EF 30%, Septal-lat dyssynchrony, global HK, inf AK, GR1DD, mild MR. d. echo 10/2014: EF50-55%, WM nl, GR1DD, septal mild paradox. e. echo 02/2015: EF 50-55%, wm   . Depression   . Heart attack ([place])   . Heel ulcer ([place]) 04/27/2015  . History of blood transfusion ~ 2011   "plasma; had neuropathy; couldn't walk"  . Hypertension   . LBBB (left bundle branch block)   . Neuromuscular disorder ([place])   . Neuropathy 2011  . Obesity, unspecified   . OSA on CPAP    Moderate with AHI 23/hr and now on CPAP at 16cm H2O.  Her DME is [facility]  . Pure hypercholesterolemia   . Type II diabetes mellitus ([place])   .  Unspecified vitamin D deficiency     PAST SURGICAL HISTORY:  Past Surgical History:  Procedure Laterality Date  . AMPUTATION TOE Right 06/18/2015   Procedure: AMPUTATION TOE;  Surgeon: Samara Deist, DPM;  Location: [facility] ORS;  Service: Podiatry;  Laterality: Right;  . AMPUTATION TOE Left 10/08/2018   Procedure: AMPUTATION TOE LEFT 2ND;  Surgeon: Albertine Patricia, DPM;  Location: [facility] ORS;  Service: Podiatry;  Laterality: Left;  . BACK SURGERY    . BI-VENTRICULAR IMPLANTABLE CARDIOVERTER DEFIBRILLATOR N/A  07/06/2014   Procedure: BI-VENTRICULAR IMPLANTABLE CARDIOVERTER DEFIBRILLATOR  (CRT-D);  Surgeon: Deboraha Sprang, MD;  Location: [facility] CATH LAB;  Service: Cardiovascular;  Laterality: N/A;  . BI-VENTRICULAR IMPLANTABLE CARDIOVERTER DEFIBRILLATOR  (CRT-D)  07/06/2014  . BILATERAL OOPHORECTOMY  01/2011   ovarian cyst benign  . COLONOSCOPY WITH PROPOFOL Left 02/22/2015   Procedure: COLONOSCOPY WITH PROPOFOL;  Surgeon: Hulen Luster, MD;  Location: [facility] ENDOSCOPY;  Service: Endoscopy;  Laterality: Left;  . CORONARY ANGIOPLASTY WITH STENT PLACEMENT Left 07/2012   new onset systolic CHF; elevated troponins.  Cardiac catheterization with stenting to LAD; EF 15%.  2D-echo: EF 20-25%.  . ESOPHAGOGASTRODUODENOSCOPY N/A 02/22/2015   Procedure: ESOPHAGOGASTRODUODENOSCOPY (EGD);  Surgeon: Hulen Luster, MD;  Location: [facility] ENDOSCOPY;  Service: Endoscopy;  Laterality: N/A;  . INCISION AND DRAINAGE ABSCESS Right 2007   groin; with ICU stay due to sepsis.  Marland Kitchen LAPAROSCOPIC CHOLECYSTECTOMY  2011  . LEFT HEART CATH AND CORONARY ANGIOGRAPHY N/A 10/05/2018   Procedure: LEFT HEART CATH AND CORONARY ANGIOGRAPHY;  Surgeon: Minna Merritts, MD;  Location: [place] CV LAB;  Service: Cardiovascular;  Laterality: N/A;  . LEFT HEART CATHETERIZATION WITH CORONARY ANGIOGRAM N/A 07/21/2012   Procedure: LEFT HEART CATHETERIZATION WITH CORONARY ANGIOGRAM;  Surgeon: Jolaine Artist, MD;  Location: [facility] CATH LAB;  Service: Cardiovascular;  Laterality: N/A;  . [place]   L4-5  . PERCUTANEOUS CORONARY STENT INTERVENTION (PCI-S) N/A 07/23/2012   Procedure: PERCUTANEOUS CORONARY STENT INTERVENTION (PCI-S);  Surgeon: Sherren Mocha, MD;  Location: [facility] CATH LAB;  Service: Cardiovascular;  Laterality: N/A;  . PERIPHERAL VASCULAR BALLOON ANGIOPLASTY Left 10/06/2018   Procedure: PERIPHERAL VASCULAR BALLOON ANGIOPLASTY;  Surgeon: Katha Cabal, MD;  Location: [place] CV LAB;  Service: Cardiovascular;  Laterality: Left;   . PERIPHERAL VASCULAR CATHETERIZATION N/A 02/10/2015   Procedure: Picc Line Insertion;  Surgeon: Katha Cabal, MD;  Location: [place] CV LAB;  Service: Cardiovascular;  Laterality: N/A;  . TEE WITHOUT CARDIOVERSION N/A 10/02/2018   Procedure: TRANSESOPHAGEAL ECHOCARDIOGRAM (TEE);  Surgeon: Wellington Hampshire, MD;  Location: [facility] ORS;  Service: Cardiovascular;  Laterality: N/A;  . [place]  . VAGINAL HYSTERECTOMY  01/2011   Fibroids/DUB.  Ovaries removed. [place].    SOCIAL HISTORY:  Social History   Tobacco Use  . Smoking status: Never Smoker  . Smokeless tobacco: Never Used  Substance Use Topics  . Alcohol use: No    Alcohol/week: 0.0 standard drinks    FAMILY HISTORY:  Family History  Problem Relation Age of Onset  . Diabetes Mother   . Hypertension Mother   . Arthritis Mother        knees, lumbar DDD, cervical DDD  . Cancer Father        prostate,skin,lymphoma.  . Cancer Brother 22       bladder cancer; non-smoker  . Diabetes Maternal Grandmother   . Heart disease Maternal Grandmother   . COPD Maternal Grandmother   . Diabetes  Paternal Grandmother   . Obesity Brother   . Diabetes Son   . Hypertension Son   . Cancer Maternal Grandfather   . Diabetes Paternal Grandfather     DRUG ALLERGIES: No Known Allergies  REVIEW OF SYSTEMS:   CONSTITUTIONAL: No fever, fatigue or weakness.  EYES: No blurred or double vision.  EARS, NOSE, AND THROAT: No tinnitus or ear pain.  RESPIRATORY: No cough, shortness of breath, wheezing or hemoptysis.  CARDIOVASCULAR: No chest pain, orthopnea, edema.  GASTROINTESTINAL: No nausea, vomiting, diarrhea or abdominal pain.  GENITOURINARY: No dysuria, hematuria.  ENDOCRINE: No polyuria, nocturia,  HEMATOLOGY: No anemia, easy bruising or bleeding SKIN: No rash or lesion. MUSCULOSKELETAL: No joint pain or arthritis.   NEUROLOGIC: No tingling, numbness, weakness.  PSYCHIATRY: No anxiety or depression.   MEDICATIONS  AT HOME:  Prior to Admission medications   Medication Sig Start Date End Date Taking? Authorizing Provider  albuterol (PROVENTIL) (2.5 MG/3ML) 0.083% nebulizer solution Take 3 mLs (2.5 mg total) by nebulization every 4 (four) hours as needed for wheezing or shortness of breath. 11/24/18   Epifanio Lesches, MD  aspirin EC 81 MG EC tablet Take 1 tablet (81 mg total) by mouth daily. 11/24/18   Epifanio Lesches, MD  clopidogrel (PLAVIX) 75 MG tablet Take 1 tablet (75 mg total) by mouth daily. 11/24/18   Epifanio Lesches, MD  insulin aspart (NOVOLOG) 100 UNIT/ML injection Inject 0-15 Units into the skin 3 (three) times daily with meals. 11/24/18   Epifanio Lesches, MD  insulin aspart (NOVOLOG) 100 UNIT/ML injection Inject 0-5 Units into the skin at bedtime. 11/24/18   Epifanio Lesches, MD  insulin glargine (LANTUS) 100 UNIT/ML injection Inject 0.16 mLs (16 Units total) into the skin at bedtime. 11/24/18   Epifanio Lesches, MD  ondansetron (ZOFRAN) 4 MG/2ML SOLN injection Inject 2 mLs (4 mg total) into the vein every 6 (six) hours as needed for nausea. 11/24/18   Epifanio Lesches, MD      PHYSICAL EXAMINATION:   VITAL SIGNS: There were no vitals taken for this visit.  GENERAL:  63 y.o.-year-old patient lying in the bed with no acute distress.  EYES: Pupils equal, round, reactive to light and accommodation. No scleral icterus. Extraocular muscles intact.  HEENT: Head atraumatic, normocephalic. Oropharynx and nasopharynx clear.  NECK:  Supple, no jugular venous distention. No thyroid enlargement, no tenderness.  LUNGS: Normal breath sounds bilaterally, no wheezing, rales,rhonchi or crepitation. No use of accessory muscles of respiration.  Bandage on chest wall. CARDIOVASCULAR: S1, S2 normal. No murmurs, rubs, or gallops.  ABDOMEN: Soft, nontender, nondistended. Bowel sounds present. No organomegaly or mass.  EXTREMITIES: No pedal edema, cyanosis, or clubbing.  NEUROLOGIC:  Cranial nerves II through XII are intact. Muscle strength 5/5 in all extremities. Sensation intact. Gait not checked.  PSYCHIATRIC: The patient is alert and oriented x 3.  SKIN: No obvious rash, lesion, or ulcer.   LABORATORY PANEL:   CBC No results for input(s): WBC, HGB, HCT, PLT, MCV, MCH, MCHC, RDW, LYMPHSABS, MONOABS, EOSABS, BASOSABS, BANDABS in the last 168 hours.  Invalid input(s): NEUTRABS, BANDSABD ------------------------------------------------------------------------------------------------------------------  Chemistries  No results for input(s): NA, K, CL, CO2, GLUCOSE, BUN, CREATININE, CALCIUM, MG, AST, ALT, ALKPHOS, BILITOT in the last 168 hours.  Invalid input(s): GFRCGP ------------------------------------------------------------------------------------------------------------------ CrCl cannot be calculated (Unknown ideal weight.). ------------------------------------------------------------------------------------------------------------------ No results for input(s): TSH, T4TOTAL, T3FREE, THYROIDAB in the last 72 hours.  Invalid input(s): FREET3   Coagulation profile No results for input(s): INR, PROTIME in the last 168  hours. ------------------------------------------------------------------------------------------------------------------- No results for input(s): DDIMER in the last 72 hours. -------------------------------------------------------------------------------------------------------------------  Cardiac Enzymes No results for input(s): CKMB, TROPONINI, MYOGLOBIN in the last 168 hours.  Invalid input(s): CK ------------------------------------------------------------------------------------------------------------------ Invalid input(s): POCBNP  ---------------------------------------------------------------------------------------------------------------  Urinalysis    Component Value Date/Time   COLORURINE YELLOW (A) 10/14/2018 1121    APPEARANCEUR CLEAR (A) 10/14/2018 1121   LABSPEC 1.006 10/14/2018 1121   PHURINE 5.0 10/14/2018 1121   GLUCOSEU NEGATIVE 10/14/2018 1121   HGBUR MODERATE (A) 10/14/2018 1121   BILIRUBINUR NEGATIVE 10/14/2018 1121   BILIRUBINUR negative 09/27/2017 1506   BILIRUBINUR neg 01/16/2015 1533   KETONESUR NEGATIVE 10/14/2018 1121   PROTEINUR 30 (A) 10/14/2018 1121   UROBILINOGEN 0.2 09/27/2017 1506   UROBILINOGEN 1.0 05/17/2010 0214   NITRITE NEGATIVE 10/14/2018 1121   LEUKOCYTESUR NEGATIVE 10/14/2018 1121     RADIOLOGY: [place] Ekg Site Rite  Result Date: 12/14/2018 If Site Rite image not attached, placement could not be confirmed due to current cardiac rhythm.   EKG: Orders placed or performed during the hospital encounter of 11/22/18  . ED EKG  . ED EKG  . EKG 12-Lead  . EKG 12-Lead  . EKG 12-Lead  . EKG 12-Lead    IMPRESSION AND PLAN:  63 year old female patient with a known history of recurrent MSSA bacteremia, left second toe osteomyelitis, chronic systolic heart failure, ICD pacemaker, CAD, CKD was transferred from [facility] today.  Patient was transferred from [facility] health to [facility] on February 22 for ICD removal in the setting of MSSA bacteremia.  Patient had repeat TEE at [facility].  Repeat TEE showed tricuspid valve endocarditis.  Vegetations have been excised and removed by Angie VAC.  ICD lead is also been excised.  Currently patient on IV cefazolin antibiotic and rifampin which needs to be continued for 6 weeks.  Records from [facility] reviewed.  Repeat blood cultures at [facility] were negative.  -MSSA bacteremia with infective endocarditis Vegetations have been removed by Angiovac ICD lead excised Continue IV cefazolin antibiotic for 6 weeks Continue rifampin PICC line versus central line placement Infectious disease follow-up at our hospital  -Coronary artery disease Continue aspirin and Plavix  -History of ischemic cardiomyopathy with EF of  35% Continue medical management  -DVT prophylaxis subcu heparin  All the records are reviewed and case discussed with ED provider. Management plans discussed with the patient, family and they are in agreement.  CODE STATUS:Full code Code Status History    Date Active Date Inactive Code Status Order ID Comments User Context   11/22/2018 1543 11/29/2018 0519 Full Code [id number]  Gorden Harms, MD Inpatient   11/22/2018 1543 11/22/2018 1543 Full Code [id number]  Gorden Harms, MD Inpatient   10/31/2018 0232 11/04/2018 1826 Full Code [id number]  Lance Coon, MD Inpatient   10/13/2018 0047 10/16/2018 1900 DNR [id number]  Gorden Harms, MD Inpatient   09/28/2018 1456 10/09/2018 1726 Full Code [id number]  Nicholes Mango, MD ED   11/11/2015 1411 11/12/2015 1635 Full Code [id number]  Vaughan Basta, MD Inpatient   06/17/2015 0148 06/19/2015 1659 Full Code [id number]  Harrie Foreman, MD Inpatient   02/17/2015 1330 02/24/2015 1925 DNR [id number]  Bettey Costa, MD ED   07/06/2014 1100 07/07/2014 1524 Full Code [id number]  Deboraha Sprang, MD Inpatient   07/22/2012 1427 07/26/2012 1507 DNR [id number]  Nolon Rod, DO Inpatient       TOTAL TIME TAKING CARE OF THIS PATIENT: 55 minutes.    Reatha Harps   M.D on 12/14/2018 at 6:09 PM  Between 7am to 6pm - Pager - [phone]  After 6pm go to [email or web address] - password [email or web address] [place] [facility]  Office  [phone]  CC: Primary care physician; Rutherford Guys, MD

## 2018-12-15 ENCOUNTER — Inpatient Hospital Stay: Payer: Self-pay

## 2018-12-15 ENCOUNTER — Inpatient Hospital Stay: Payer: PPO

## 2018-12-15 DIAGNOSIS — Z89422 Acquired absence of other left toe(s): Secondary | ICD-10-CM

## 2018-12-15 DIAGNOSIS — B9561 Methicillin susceptible Staphylococcus aureus infection as the cause of diseases classified elsewhere: Secondary | ICD-10-CM

## 2018-12-15 DIAGNOSIS — R7881 Bacteremia: Secondary | ICD-10-CM

## 2018-12-15 DIAGNOSIS — I2510 Atherosclerotic heart disease of native coronary artery without angina pectoris: Secondary | ICD-10-CM

## 2018-12-15 DIAGNOSIS — Z872 Personal history of diseases of the skin and subcutaneous tissue: Secondary | ICD-10-CM

## 2018-12-15 DIAGNOSIS — D649 Anemia, unspecified: Secondary | ICD-10-CM

## 2018-12-15 DIAGNOSIS — I420 Dilated cardiomyopathy: Secondary | ICD-10-CM

## 2018-12-15 DIAGNOSIS — Z8701 Personal history of pneumonia (recurrent): Secondary | ICD-10-CM

## 2018-12-15 DIAGNOSIS — I5022 Chronic systolic (congestive) heart failure: Secondary | ICD-10-CM

## 2018-12-15 DIAGNOSIS — Z9889 Other specified postprocedural states: Secondary | ICD-10-CM

## 2018-12-15 DIAGNOSIS — Z955 Presence of coronary angioplasty implant and graft: Secondary | ICD-10-CM

## 2018-12-15 DIAGNOSIS — Z8619 Personal history of other infectious and parasitic diseases: Secondary | ICD-10-CM

## 2018-12-15 DIAGNOSIS — I330 Acute and subacute infective endocarditis: Secondary | ICD-10-CM

## 2018-12-15 DIAGNOSIS — I25118 Atherosclerotic heart disease of native coronary artery with other forms of angina pectoris: Secondary | ICD-10-CM

## 2018-12-15 DIAGNOSIS — Z8739 Personal history of other diseases of the musculoskeletal system and connective tissue: Secondary | ICD-10-CM

## 2018-12-15 DIAGNOSIS — T827XXA Infection and inflammatory reaction due to other cardiac and vascular devices, implants and grafts, initial encounter: Secondary | ICD-10-CM

## 2018-12-15 DIAGNOSIS — D500 Iron deficiency anemia secondary to blood loss (chronic): Secondary | ICD-10-CM

## 2018-12-15 LAB — BASIC METABOLIC PANEL
ANION GAP: 7 (ref 5–15)
BUN: 27 mg/dL — AB (ref 8–23)
CO2: 31 mmol/L (ref 22–32)
CREATININE: 1.14 mg/dL — AB (ref 0.44–1.00)
Calcium: 8.4 mg/dL — ABNORMAL LOW (ref 8.9–10.3)
Chloride: 100 mmol/L (ref 98–111)
GFR calc Af Amer: 59 mL/min — ABNORMAL LOW (ref >60)
GFR calc non Af Amer: 51 mL/min — ABNORMAL LOW (ref >60)
Glucose, Bld: 163 mg/dL — ABNORMAL HIGH (ref 70–99)
Potassium: 4 mmol/L (ref 3.5–5.1)
Sodium: 138 mmol/L (ref 135–145)

## 2018-12-15 LAB — CBC
HCT: 25.3 % — ABNORMAL LOW (ref 36.0–46.0)
Hemoglobin: 7.7 g/dL — ABNORMAL LOW (ref 12.0–15.0)
MCH: 28.4 pg (ref 26.0–34.0)
MCHC: 30.4 g/dL (ref 30.0–36.0)
MCV: 93.4 fL (ref 80.0–100.0)
PLATELETS: 203 10*3/uL (ref 150–400)
RBC: 2.71 MIL/uL — ABNORMAL LOW (ref 3.87–5.11)
RDW: 16.9 % — AB (ref 11.5–15.5)
WBC: 4.3 10*3/uL (ref 4.0–10.5)
nRBC: 0 % (ref 0.0–0.2)

## 2018-12-15 LAB — GLUCOSE, CAPILLARY
GLUCOSE-CAPILLARY: 145 mg/dL — AB (ref 70–99)
GLUCOSE-CAPILLARY: 333 mg/dL — AB (ref 70–99)
Glucose-Capillary: 140 mg/dL — ABNORMAL HIGH (ref 70–99)
Glucose-Capillary: 147 mg/dL — ABNORMAL HIGH (ref 70–99)

## 2018-12-15 MED ORDER — CARVEDILOL 6.25 MG PO TABS
6.2500 mg | ORAL_TABLET | Freq: Two times a day (BID) | ORAL | Status: DC
  Administered 2018-12-15 – 2018-12-16 (×2): 6.25 mg via ORAL
  Filled 2018-12-15 (×2): qty 1

## 2018-12-15 MED ORDER — LOSARTAN POTASSIUM 50 MG PO TABS
50.0000 mg | ORAL_TABLET | Freq: Every day | ORAL | Status: DC
  Administered 2018-12-15 – 2018-12-16 (×2): 50 mg via ORAL
  Filled 2018-12-15 (×2): qty 1

## 2018-12-15 MED ORDER — TORSEMIDE 20 MG PO TABS
20.0000 mg | ORAL_TABLET | Freq: Every day | ORAL | Status: DC
  Administered 2018-12-15 – 2018-12-16 (×2): 20 mg via ORAL
  Filled 2018-12-15 (×2): qty 1

## 2018-12-15 MED ORDER — SODIUM CHLORIDE 0.9% FLUSH
10.0000 mL | Freq: Two times a day (BID) | INTRAVENOUS | Status: DC
  Administered 2018-12-15 – 2018-12-16 (×2): 10 mL

## 2018-12-15 MED ORDER — SODIUM CHLORIDE 0.9% FLUSH: 10.0000 mL | INTRAVENOUS | Status: DC | PRN

## 2018-12-15 MED ORDER — RIFAMPIN 300 MG PO CAPS
300.0000 mg | ORAL_CAPSULE | Freq: Three times a day (TID) | ORAL | Status: DC
  Administered 2018-12-15: 300 mg via ORAL
  Filled 2018-12-15 (×2): qty 1

## 2018-12-15 MED ORDER — ASPIRIN 81 MG PO CHEW
81.0000 mg | CHEWABLE_TABLET | Freq: Every day | ORAL | Status: DC
  Administered 2018-12-15 – 2018-12-16 (×2): 81 mg via ORAL
  Filled 2018-12-15 (×2): qty 1

## 2018-12-15 MED ORDER — SODIUM CHLORIDE 0.9 % IV SOLN
200.0000 mg | INTRAVENOUS | Status: DC
  Administered 2018-12-15: 200 mg via INTRAVENOUS
  Filled 2018-12-15 (×2): qty 10

## 2018-12-15 NOTE — TOC Initial Note (Signed)
Transition of Care [facility]) - Initial/Assessment Note    Patient Details  Name: [name] MRN: [id number] Date of Birth: 1955/11/01  Transition of Care [facility] CM/SW Contact:    Elza Rafter, RN Phone Number: 12/15/2018, 2:27 PM  Clinical Narrative:    Patient is from home with husband.  Will be discharging on home IV antibiotics.  Pam with [facility] was made aware this morning.   Pending ID MD consult.  Out patient Palliative is following and is aware of patient admission.  Uses a walker at home.  Medications obtained at [place] without difficulty.   Current with PCP.  Hx of CHF.  Has a scale at home and weighs daily.  Current with HF clinic.  No further needs identified at this time.  Husband will tranport to home at the time of discharge.               Expected Discharge Plan: [place] Services Barriers to Discharge: Other (comment)(Pending ID consult and PICC line Placement for home antibiotics)   Patient Goals and CMS Choice Patient states their goals for this hospitalization and ongoing recovery are:: DC to home with husband with [facility] services through [place] Medicare.gov Compare Post Acute Care list provided to:: Patient Choice offered to / list presented to : Patient  Expected Discharge Plan and Services Expected Discharge Plan: [place] Discharge Planning Services: CM Consult Post Acute Care Choice: [place] arrangements for the past 2 months: Single Family Home                     HH Arranged: RN, PT, IV Antibiotics HH Agency: [place] ([facility])  Prior Living Arrangements/Services Living arrangements for the past 2 months: Single Family Home Lives with:: Spouse Patient language and need for interpreter reviewed:: Yes Do you feel safe going back to the place where you live?: Yes      Need for Family Participation in Patient Care: Yes (Comment)(IV antibiotics) Care giver  support system in place?: Yes (comment)   Criminal Activity/Legal Involvement Pertinent to Current Situation/Hospitalization: No - Comment as needed  Activities of Daily Living Home Assistive Devices/Equipment: Shower chair with back, Bedside commode/3-in-1, Walker (specify type), Cane (specify quad or straight), CBG Meter ADL Screening (condition at time of admission) Patient's cognitive ability adequate to safely complete daily activities?: Yes Is the patient deaf or have difficulty hearing?: No Does the patient have difficulty seeing, even when wearing glasses/contacts?: No Does the patient have difficulty concentrating, remembering, or making decisions?: No Patient able to express need for assistance with ADLs?: Yes Does the patient have difficulty dressing or bathing?: No Independently performs ADLs?: Yes (appropriate for developmental age) Does the patient have difficulty walking or climbing stairs?: Yes Weakness of Legs: Both Weakness of Arms/Hands: None  Permission Sought/Granted                  Emotional Assessment Appearance:: Appears stated age Attitude/Demeanor/Rapport: Gracious Affect (typically observed): Accepting Orientation: : Oriented to Self, Oriented to Place, Oriented to  Time, Oriented to Situation Alcohol / Substance Use: Not Applicable    Admission diagnosis:  infection Patient Active Problem List   Diagnosis Date Noted  . Endocarditis 12/14/2018  . NSTEMI (non-ST elevated myocardial infarction) ([place]) 11/22/2018  . Acute exacerbation of CHF (congestive heart failure) ([place]) 10/31/2018  . Acute on chronic systolic CHF (congestive heart failure) ([place]) 10/28/2018  . Lymphedema  10/28/2018  . History of amputation of lesser toe of right foot ([place]) 04/19/2018  . 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 Paraparesis of both lower limbs ([place]) 03/29/2018  . 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 Paroxysmal atrial fibrillation  ([place]) 03/29/2018  . Class 3 obesity due to excess calories with serious comorbidity and body mass index (BMI) of 40.0 to 44.9 in adult 09/02/2016  . History of CVA (cerebrovascular accident) 11/11/2015  . Chronic renal insufficiency 08/04/2015  . OSA (obstructive sleep apnea) 08/15/2014  . Morbid obesity ([place]) 10/26/2013  . Essential hypertension, benign 04/29/2013  . Pure hypercholesterolemia 12/07/2012  . Iron deficiency anemia 12/07/2012  . Diabetic peripheral neuropathy associated with type 2 diabetes mellitus ([place]) 12/07/2012  . Chronic systolic congestive heart failure ([place]) 07/29/2012  . Left bundle-branch block 07/25/2012  . Atherosclerotic heart disease of native coronary artery without angina pectoris 07/25/2012   PCP:  Rutherford Guys, MD Pharmacy:   [facility] ([place]) [place], [place] [address] Phone: [phone] Fax: [phone] - [place], [place] - [address] [place] [address] Phone: [phone] Fax: [phone]     Social Determinants of Health (SDOH) Interventions    Readmission Risk Interventions 30 Day Unplanned Readmission Risk Score     Admission (Current) from 12/14/2018 in [place] (2A)  30 Day Unplanned Readmission Risk Score (%)  32 Filed at 12/15/2018 1200     This score is the patient's risk of an unplanned readmission within 30 days of being discharged (0 -100%). The score is based on dignosis, age, lab data, medications, orders, and past utilization.   Low:  0-14.9   Medium: 15-21.9   High: 22-29.9   Extreme: 30 and above       No flowsheet data found.

## 2018-12-15 NOTE — Progress Notes (Signed)
Pharmacy Antibiotic Note  [name] is a 63 y.o. female admitted on 12/14/2018 with endocarditis.  Pharmacy has been consulted for rifampin dosing.  Plan: Rifampin 300mg  PO every 8 hours, recommended for at least 6 weeks. Recommend repeating cultures in a few days.  Height: 5\' 8"  (172.7 cm) Weight: 282 lb (127.9 kg) IBW/kg (Calculated) : 63.9  Temp (24hrs), Avg:98.4 F (36.9 C), Min:98.3 F (36.8 C), Max:98.5 F (36.9 C)  Recent Labs  Lab 12/14/18 1838 12/15/18 0452  WBC 5.3 4.3  CREATININE 1.14*  1.18* 1.14*    Estimated Creatinine Clearance: 70.4 mL/min (A) (by C-G formula based on SCr of 1.14 mg/dL (H)).    No Known Allergies  Antimicrobials this admission: 3/9 ancef >>  3/10 rifampin >>   Dose adjustments this admission: none  Microbiology results: 2/16 BCx: MSSA  Thank you for allowing pharmacy to be a part of this patient's care.  Domanique Luckett A Milea Klink 12/15/2018 8:49 AM

## 2018-12-15 NOTE — Consult Note (Signed)
Cardiology Consultation:   Patient ID: [name] MRN: [id number]; DOB: 09/18/1956  Admit date: 12/14/2018 Date of Consult: 12/15/2018  Primary Care Provider: Rutherford Guys, MD Primary Cardiologist: Ida Rogue, MD  Primary Electrophysiologist:  None   Patient Profile:   [name] is a 63 y.o. 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 Tressia Miners.  History of Present Illness:   [name] is a 63 year old female with PMH as above.  NSTEMI 09/2018 2/2 demand ischemia from MSSA bacteremia.  Discharged from [facility] 10/2018 after osteomyelitis of the foot, toe amputation, and acute respiratory distress felt to be 2/2 anemia, pneumonia, systolic and diastolic CHF improved with diuretics.    Most recently admitted to [facility] 11/22/2018 with elevated troponin, HTN, acute renal failure, and blood culture positive for MSSA bacteremia.  On 11/28/2018, transferred from [facility] to [facility] for removal of ICD. Admitted to [facility] 2/23-3/9. She underwent successful AnioVac 9/93 without complications with recommendation for R lead and ICD removal as CT chest showing multiple septic emboli but MRI thoracic and lumbar not showing discitis or epidural abscess. CXR confirms removal of ICD 12/11/2018.   Patient transferred back to [facility] from [facility] 12/14/2018. Per [facility], plan was for continued abx. ID and Cardiology consulted.   Past Medical History:  Diagnosis Date  . Acute myocardial infarction, subendocardial infarction, initial episode of care ([place]) 07/21/2012  . Acute osteomyelitis involving ankle and foot ([place]) 02/06/2015  . Acute systolic heart failure ([place]) 07/21/2012   New onset 07/19/12;  admission to [facility] ED. Elevated Troponins.  S/p 2D-echo with EF 20-25%.  S/p cardiac catheterization with stenting LAD.  Repeat 2D-echo 10/2011 with improved EF of 35%.   . Anemia   . Automatic implantable cardioverter-defibrillator in situ    a. MDT CRT-D 06/2014, SN: [id number]    . CAD (coronary artery disease)    a. cardiac cath 101/04/2012: PCI/DES to chronically occluded mLAD, consideration PCI to diag branch in 4 weeks.   . Cataract   . Chicken pox   . Chronic kidney disease   . Chronic systolic CHF (congestive heart failure) (HCC)    a. mixed ICM & NICM; b. EF 20-25% by echo 07/2012, mid-dist 2/3 of LV sev HK/AK, mild MR. echo 10/2012: EF 30-35%, sev HK ant-septal & inf walls, GR1DD, mild MR, PASP 33. c. echo 02/2013: EF 30%, GR1DD, mild MR. echo 04/2014: EF 30%, Septal-lat dyssynchrony, global HK, inf AK, GR1DD, mild MR. d. echo 10/2014: EF50-55%, WM nl, GR1DD, septal mild paradox. e. echo 02/2015: EF 50-55%, wm   . Depression   . Heart attack ([place])   . Heel ulcer ([place]) 04/27/2015  . History of blood transfusion ~ 2011   "plasma; had neuropathy; couldn't walk"  . Hypertension   . LBBB (left bundle branch block)   . Neuromuscular disorder ([place])   . Neuropathy 2011  . Obesity, unspecified   . OSA on CPAP    Moderate with AHI 23/hr and now on CPAP at 16cm H2O.  Her DME is [facility]  . Pure hypercholesterolemia   . Type II diabetes mellitus ([place])   . Unspecified vitamin D deficiency     Past Surgical History:  Procedure  Laterality Date  . AMPUTATION TOE Right 06/18/2015   Procedure: AMPUTATION TOE;  Surgeon: Samara Deist, DPM;  Location: [facility] ORS;  Service: Podiatry;  Laterality: Right;  . AMPUTATION TOE Left 10/08/2018   Procedure: AMPUTATION TOE LEFT 2ND;  Surgeon: Albertine Patricia, DPM;  Location: [facility] ORS;  Service: Podiatry;  Laterality: Left;  . BACK SURGERY    . BI-VENTRICULAR IMPLANTABLE CARDIOVERTER DEFIBRILLATOR N/A 07/06/2014   Procedure: BI-VENTRICULAR IMPLANTABLE CARDIOVERTER  DEFIBRILLATOR  (CRT-D);  Surgeon: Deboraha Sprang, MD;  Location: [facility] CATH LAB;  Service: Cardiovascular;  Laterality: N/A;  . BI-VENTRICULAR IMPLANTABLE CARDIOVERTER DEFIBRILLATOR  (CRT-D)  07/06/2014  . BILATERAL OOPHORECTOMY  01/2011   ovarian cyst benign  . COLONOSCOPY WITH PROPOFOL Left 02/22/2015   Procedure: COLONOSCOPY WITH PROPOFOL;  Surgeon: Hulen Luster, MD;  Location: [facility] ENDOSCOPY;  Service: Endoscopy;  Laterality: Left;  . CORONARY ANGIOPLASTY WITH STENT PLACEMENT Left 07/2012   new onset systolic CHF; elevated troponins.  Cardiac catheterization with stenting to LAD; EF 15%.  2D-echo: EF 20-25%.  . ESOPHAGOGASTRODUODENOSCOPY N/A 02/22/2015   Procedure: ESOPHAGOGASTRODUODENOSCOPY (EGD);  Surgeon: Hulen Luster, MD;  Location: [facility] ENDOSCOPY;  Service: Endoscopy;  Laterality: N/A;  . INCISION AND DRAINAGE ABSCESS Right 2007   groin; with ICU stay due to sepsis.  Marland Kitchen LAPAROSCOPIC CHOLECYSTECTOMY  2011  . LEFT HEART CATH AND CORONARY ANGIOGRAPHY N/A 10/05/2018   Procedure: LEFT HEART CATH AND CORONARY ANGIOGRAPHY;  Surgeon: Minna Merritts, MD;  Location: [place] CV LAB;  Service: Cardiovascular;  Laterality: N/A;  . LEFT HEART CATHETERIZATION WITH CORONARY ANGIOGRAM N/A 07/21/2012   Procedure: LEFT HEART CATHETERIZATION WITH CORONARY ANGIOGRAM;  Surgeon: Jolaine Artist, MD;  Location: [facility] CATH LAB;  Service: Cardiovascular;  Laterality: N/A;  . [place]   L4-5  . PERCUTANEOUS CORONARY STENT INTERVENTION (PCI-S) N/A 07/23/2012   Procedure: PERCUTANEOUS CORONARY STENT INTERVENTION (PCI-S);  Surgeon: Sherren Mocha, MD;  Location: [facility] CATH LAB;  Service: Cardiovascular;  Laterality: N/A;  . PERIPHERAL VASCULAR BALLOON ANGIOPLASTY Left 10/06/2018   Procedure: PERIPHERAL VASCULAR BALLOON ANGIOPLASTY;  Surgeon: Katha Cabal, MD;  Location: [place] CV LAB;  Service: Cardiovascular;  Laterality: Left;  . PERIPHERAL VASCULAR CATHETERIZATION N/A 02/10/2015    Procedure: Picc Line Insertion;  Surgeon: Katha Cabal, MD;  Location: [place] CV LAB;  Service: Cardiovascular;  Laterality: N/A;  . TEE WITHOUT CARDIOVERSION N/A 10/02/2018   Procedure: TRANSESOPHAGEAL ECHOCARDIOGRAM (TEE);  Surgeon: Wellington Hampshire, MD;  Location: [facility] ORS;  Service: Cardiovascular;  Laterality: N/A;  . [place]  . VAGINAL HYSTERECTOMY  01/2011   Fibroids/DUB.  Ovaries removed. [place].     Home Medications:  Prior to Admission medications   Medication Sig Start Date End Date Taking? Authorizing Provider  albuterol (PROVENTIL) (2.5 MG/3ML) 0.083% nebulizer solution Take 3 mLs (2.5 mg total) by nebulization every 4 (four) hours as needed for wheezing or shortness of breath. 11/24/18   Epifanio Lesches, MD  aspirin EC 81 MG EC tablet Take 1 tablet (81 mg total) by mouth daily. 11/24/18   Epifanio Lesches, MD  clopidogrel (PLAVIX) 75 MG tablet Take 1 tablet (75 mg total) by mouth daily. 11/24/18   Epifanio Lesches, MD  insulin aspart (NOVOLOG) 100 UNIT/ML injection Inject 0-15 Units into the skin 3 (three) times daily with meals. 11/24/18   Epifanio Lesches, MD  insulin aspart (NOVOLOG) 100 UNIT/ML injection Inject 0-5 Units into the skin at bedtime. 11/24/18   Epifanio Lesches, MD  insulin glargine (LANTUS) 100 UNIT/ML injection Inject 0.16 mLs (16 Units total) into the skin at bedtime. 11/24/18   Epifanio Lesches, MD  ondansetron (ZOFRAN) 4 MG/2ML SOLN injection Inject 2 mLs (4 mg total) into the vein every 6 (six) hours as needed for nausea. 11/24/18   Epifanio Lesches, MD    Inpatient Medications: Scheduled Meds: . heparin  5,000 Units Subcutaneous Q8H  . insulin aspart  0-15 Units Subcutaneous TID WC  . insulin aspart  0-5 Units Subcutaneous QHS  . insulin glargine  16 Units Subcutaneous QHS  . rifampin  300 mg Oral Q8H  . sodium chloride flush  3 mL Intravenous Q12H   Continuous Infusions: . sodium chloride    .   ceFAZolin (ANCEF) IV 2 g (12/15/18 0623)   PRN Meds: sodium chloride, acetaminophen **OR** acetaminophen, albuterol, HYDROcodone-acetaminophen, ondansetron **OR** ondansetron (ZOFRAN) IV, senna-docusate, sodium chloride flush  Allergies:   No Known Allergies  Social History:   Social History   Socioeconomic History  . Marital status: Married    Spouse name: [name]  . Number of children: 2  . Years of education: 64  . Highest education level: High school graduate  Occupational History  . Occupation: disabled    Fish farm manager: UNEMPLOYED    Comment: 03/2010 for peripheral neuropathy  . 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  Social Needs  . Financial resource strain: Not hard at all  . Food insecurity:    Worry: Never true    Inability: Never true  . Transportation needs:    Medical: No    Non-medical: No  Tobacco Use  . Smoking status: Never Smoker  . Smokeless tobacco: Never Used  Substance and Sexual Activity  . Alcohol use: No    Alcohol/week: 0.0 standard drinks  . Drug use: No  . Sexual activity: Never    Birth control/protection: Post-menopausal, Surgical  Lifestyle  . Physical activity:    Days per week: 0 days    Minutes per session: 0 min  . Stress: Rather much  Relationships  . Social connections:    Talks on phone: More than three times a week    Gets together: More than three times a week    Attends religious service: Never    Active member of club or organization: No    Attends meetings of clubs or organizations: Never    Relationship status: Married  . Intimate partner violence:    Fear of current or ex partner: No    Emotionally abused: No    Physically abused: No    Forced sexual activity: No  Other Topics Concern  . Not on file  Social History Narrative   Marital status:Married x 45 yrs. Happily married, no abuse.       Children:  2 children (daughter 41, son 60). Two grandsons and 2 step grandchildren.  1 gg.      Lives: with husband,  daughter, son-in-law, 2 grandsons, and 1 on the way.      Employment: disability for peripheral neuropathy 2012.  Previously had home daycare.      Tobacco: none      Alcohol: none      Drugs: none      Exercise: none. [employer].      Pets: dog.      Always uses seat belts, smoke detectors in home.      No guns in the home.       Caffeine use: 2 cups  coffee per day.       Nutrition: Well balanced diet.    Family History:    Family History  Problem Relation Age of Onset  . Diabetes Mother   . Hypertension Mother   . Arthritis Mother        knees, lumbar DDD, cervical DDD  . Cancer Father        prostate,skin,lymphoma.  . Cancer Brother 60       bladder cancer; non-smoker  . Diabetes Maternal Grandmother   . Heart disease Maternal Grandmother   . COPD Maternal Grandmother   . Diabetes Paternal Grandmother   . Obesity Brother   . Diabetes Son   . Hypertension Son   . Cancer Maternal Grandfather   . Diabetes Paternal Grandfather      ROS:  Please see the history of present illness.   Review of Systems  Constitutional: Negative for malaise/fatigue.  Respiratory: Negative for shortness of breath and wheezing.   Cardiovascular: Positive for leg swelling. Negative for chest pain and palpitations.       Reported significantly improved since "whole thing started"  Gastrointestinal: Negative for abdominal pain, nausea and vomiting.  Musculoskeletal: Negative for falls.  Skin: Negative for itching and rash.       S/p ICD removal with incision site  Neurological: Negative for dizziness, loss of consciousness and weakness.  All other systems reviewed and are negative.   All other ROS reviewed and negative.     Physical Exam/Data:   Vitals:   12/14/18 1900 12/14/18 1943 12/15/18 0356 12/15/18 0851  BP:  (!) 119/56 138/71 137/71  Pulse:  85 85 91  Resp:  18 16 14   Temp:  98.5 F (36.9 C) 98.3 F (36.8 C) 98.1 F (36.7 C)  TempSrc:  Oral Oral Oral  SpO2:  98%  96% 95%  Weight: 125.3 kg  127.9 kg   Height: 5\' 8"  (1.727 m)       Intake/Output Summary (Last 24 hours) at 12/15/2018 1230 Last data filed at 12/15/2018 1006 Gross per 24 hour  Intake 1200 ml  Output 1800 ml  Net -600 ml   Filed Weights   12/14/18 1900 12/15/18 0356  Weight: 125.3 kg 127.9 kg   Body mass index is 42.88 kg/m.  General:  Well nourished, well developed, in no acute distress. Sitting in chair and joined by friends/family HEENT: normal Neck: JVD not assessed d/t angle of patient Vascular: Radial pulses 2+ bilaterally   Cardiac:  normal S1, S2; RRR; no murmur  Lungs:  clear to auscultation bilaterally, no wheezing, rhonchi or rales  Abd: soft, nontender, no hepatomegaly  Ext: minimal to 1+ bilateral lower extremity edema Musculoskeletal:  No deformities Skin: warm and dry. S/p ICD removal with incision site   Neuro:  no focal abnormalities noted Psych:  Normal affect   EKG: Last EKG done at previous admission - no new tracings Telemetry:  Telemetry was personally reviewed and demonstrates:  SR, LBBB (not new), HR 80s   CV Studies:   Relevant CV Studies: Limited echo 12/11/2018  Limited study to evaluate valves  Moderately decreased left ventricular systolic function, ejection  fraction 40%  Inferolateral and possible anterolateral wall motion abnormality (see  details below)  Elevated pulmonary artery systolic pressure - mild  Tricuspid regurgitation - mild  Normal right ventricular systolic function  No apparent tricuspid valve or device lead vegetation Globally reduced LV function similar to prior study 12/08/18. Wall motion  abnormalities are better evaluated on  that study.   Echo 12/08/2018 Moderately decreased left ventricular systolic function, ejection  fraction 40%  Segmental wall motion abnormality - (inferior / posterior)  Mitral annular calcification  Mitral regurgitation - mild  Dilated left atrium - mild  Aortic sclerosis   Normal right ventricular systolic function  Degenerative tricuspid valve disease  Tricuspid regurgitation - mild  Elevated pulmonary artery systolic pressure - moderate  The pacemaker lead is incompletely evaluated  Dilated right atrium - mild  No apparent valvular vegetations  12/04/2018 PROCEDURES PERFORMED Aspiration of mobile vegetation on tricuspid valve and RV lead with  angiovac Central line placement FINDINGS Mobile vegetation on tricuspid valve and RV lead, successfully aspirated.  LHC 10/05/18  Prox Cx to Dist Cx lesion is 80% stenosed.  Prox LAD-1 lesion is 40% stenosed.  Prox LAD-2 lesion is 90% stenosed.  Prox LAD to Mid LAD lesion is 80% stenosed.  Mid LAD to Dist LAD lesion is 70% stenosed.  1st Diag lesion is 90% stenosed.  Ost 1st Diag lesion is 80% stenosed.  Mid RCA-1 lesion is 40% stenosed.  Mid RCA-2 lesion is 40% stenosed. Final Conclusions:  Severe disease of LAD and diagonal branch Patent proximal LAD stent with severe stenosis at the distal edge of the stent Remainder of the LAD is also small, diffusely diseased, diagonal branch diffusely diseased -RCA large vessel dominant with mild disease, left circumflex/OM with mild disease proximal region Recommendations:  Case discussed with interventional cardiology, Placing a short stent in the distal edge of the LAD stent may provide little clinical benefit with high risk and may delay surgery on her osteomyelitis/toe infection. Troponin elevation likely secondary to demand ischemia in the setting of febrile, tachycardic, hypertensive on arrival in the setting of bacteremia --Would favor medical management at this time and proceeding with removal of her toe.   TEE 10/02/18 - Left ventricle: Systolic function was mildly reduced. The estimated ejection fraction was in the range of 45% to 50%. - Aortic valve: No evidence of vegetation. There was trivial regurgitation. - Mitral valve: There  was mild regurgitation. - Left atrium: No evidence of thrombus in the atrial cavity or appendage. - Right atrium: No evidence of thrombus in the atrial cavity or appendage. - Atrial septum: Echo contrast study showed no right-to-left atrial level shunt, at baseline or with provocation. - Pulmonic valve: No evidence of vegetation.  Impressions: - No evidence of valve vegetation or wire associated vegetation.  TTE 09/29/18 - Left ventricle: The cavity size was normal. Systolic function was mildly reduced. The estimated ejection fraction was in the range of 45% to 50%. The study is not technically sufficient to allow evaluation of LV diastolic function. - Left atrium: The atrium was mildly dilated.  Impressions: - Very suboptimal study. No evidence of vegetations. Consider TEE.  Laboratory Data:  Chemistry Recent Labs  Lab 12/14/18 1838 12/15/18 0452  NA 135 138  K 4.4 4.0  CL 95* 100  CO2 28 31  GLUCOSE 152* 163*  BUN 28* 27*  CREATININE 1.14*  1.18* 1.14*  CALCIUM 8.0* 8.4*  GFRNONAA 51*  49* 51*  GFRAA 59*  56* 59*  ANIONGAP 12 7    No results for input(s): PROT, ALBUMIN, AST, ALT, ALKPHOS, BILITOT in the last 168 hours. Hematology Recent Labs  Lab 12/14/18 1838 12/15/18 0452  WBC 5.3 4.3  RBC 2.84* 2.71*  HGB 8.1* 7.7*  HCT 26.5* 25.3*  MCV 93.3 93.4  MCH 28.5 28.4  MCHC 30.6 30.4  RDW  16.9* 16.9*  PLT 203 203   Cardiac EnzymesNo results for input(s): TROPONINI in the last 168 hours. No results for input(s): TROPIPOC in the last 168 hours.  BNPNo results for input(s): BNP, PROBNP in the last 168 hours.  DDimer No results for input(s): DDIMER in the last 168 hours.  Radiology/Studies:  [place] Ekg Site Rite  Result Date: 12/14/2018 If Site Rite image not attached, placement could not be confirmed due to current cardiac rhythm.   Assessment and Plan:   Endocarditis with h/o MSSA bacteremia -Recurrent MSSA bacteremia with osteomyelitis  of foot; recurrent bacteremia 3 days after stopping antibiotics.  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  Incision site inspected without signs of infection at this time.  - Not pacemaker dependent. ICD implantation for primary prevention of sudden death in 12/28/2013 and in setting of HFrEF. Telemetry as above with patient maintaining SR and rates in the 80s.  - No recommendation for Lifevest at this time. Continue abx. ID consulted and considering PICC . Defer management of abx per ID - Recommend outpatient follow-up with Dr. Caryl Comes / outpatient BMET in 1 week.  Chronic systolic heart failure -No SOB at this time. Successful ambulation today with no reported DOE / SOB or sx of near-syncope. As above, s/p ICD removal. Strict I's/O, daily weights. Improvement in renal function as below from earlier AKI with Cr up to ~~4.0. Will restart medical management with lower dose PTA diuresis given improvement in renal function. Will plan to start low and gradually increase to PTA dose. Will restart losartan as room in pressures with Cr improvement. Continue to monitor with daily BMET. Will need outpatient BMET in 1 week.  Anemia - Hgb 7.7. Recommendation for transfusion below 8.0. Consider also iron infusion and per IM. Continue to monitor with recommendation for FOBT to r/o bleed given on PTA DAPT with ASA and Plavix. Plan to only restart ASA for now and hold off on restarting Plavix until Hgb stable. Daily CBC.  HTN - Recommendation to restart antihypertensives with losartan 50mg  daily and slow restart of diuresis. PTA torsemide 40mg  BID alternating with 40mg  once daily. Recommend low dose restart of torsemide 20mg  daily (less than home dosage) with close monitoring of renal function and daily BMET.   CAD -No current chest pain. Known severe disease.  Previous cardiac catheterization 10/05/2018 with chronic occlusion of LAD  and intervention/PCI not recommended at that time.  No plan for ischemic workup this admission - Continue medical management with asa 81mg  and rosuvastatin. Will hold off on restarting PTA DAPT with Plavix at this time given anemia (and only restart ASA). Consider restart of Plavix with recovery of hemoglobin and prior to discharge. Will restart ARB as above with follow-up BMET recommended in 1 week (and also restarting diuretics). Not on a PTA BB. Could consider addition of low dose BB at discharge or at follow-up, such as low dose Toprol XL. Will plan for follow-up in our office with Dr. Caryl Comes and schedule at discharge as above.  Chronic kidney disease 3 - Improvement in renal function. Daily BMET. Most recent Cr 1.14. Will restart diuresis at low dose while monitor renal function.  DM2 -SSI, PTA insulin. Per IM.   For questions or updates, please contact [place] Please consult [email or web address] for contact info under     Signed, Arvil Chaco, PA-C  12/15/2018 12:30 PM

## 2018-12-15 NOTE — Treatment Plan (Signed)
Diagnosis: MSSA BACTEREMIA, TRICUSPID VALVE ENDOCARDITIS/MITRAL VALVE VEGETATION Baseline Creatinine 1.14    No Known Allergies  OPAT Orders CEFAZOLIN 2 GRAMS IV EVERY 8 HOURS UNTIL 01/22/19  PIC Care Per Protocol:including placement of biopatch  Labs weekly while on IV antibiotics: _X_ CBC with differential  X__ CMP  CRP/ESR once every  2 weeks   _X_ Please leave PIC in place until doctor has seen patient or been notified  Fax weekly labs to [phone]  Clinic Follow Up Appt:3 weeks  Call [phone] to make appt

## 2018-12-15 NOTE — Consult Note (Signed)
NAME: [name]  DOB: [birth year]  MRN: [id number]  Date/Time: 12/15/2018 1:30 PM  REQUESTING PROVIDER: Tressia Miners Subjective:  REASON FOR CONSULT: MSSA bacteremia ? [name] is a 63 y.o. female with a history of MSSA bacteremia, left 2nd toe abscess/osteo secondary to MSSA s/p amputation, radmission for MSSA and was sent to [facility] for ICD removal Transferred back to [facility] 12/14/18 I am a sked to see her for antibiotic recommendation History as follows Pt was initially admitted to [facility] between  12/23-10/09/18 Blood culture on 12/23 had 3 of 3 sets positive for MSSA She was given cefazolin. 12/24 BC neg At that time she had an infected left toe  She had an AICD and TEE done on 10/03/19  was neg- she underwent amputation of the l;eft 2nd toe on 10/08/18 As there was a source for the MSSA and it was being treated , AICD was not removed . The plan was to give 6 weeks of IV cefazolin and then repeat blood culture off antibiotic every week. She was hospitalized twice after that, once for pneumonia  between 1/7-1/10/20  Blood culture done on 10/13/18 was neg.. Was admitted to [facility] again for  CHF exacerbation between 1/25-1/29/20 She was  to finish cefazolin on 11/10/18 but as she had a week supply ( from being in the hospital) she continued for  one more week  and last day was 11/19/18  Had blood culture done on 11/17/18 and it was neg. She presented to the ED on 11/22/18  with altered mental status. Blood culture on 2/16 was positive for MSSA. I saw her them and started nafcillin and repeat BC from 2/19 was neg. The ICD was considered infected and she was sent to [facility] for removal on 11/28/18. 11/29/18 1 of 2 BC positive for MSSA.   TEE done on 11/30/18 showed a 60mmX22mm mobile mass tricuspid valve/lead 2/25 BC NG On 2/26 MRI lumbar spine-no discitis,. .  She had angiovac on 2/28 and the thrombus/evegation was removed. She was seen by ID.was on rifampin for a short period of time along with cefazolin On  2/29 BC was NG On 3/6 she had the ICD removed 3/9 transferred to [facility] Feeling much better No SOB Swelling legs much better No fever or chills  Past Medical History:  Diagnosis Date  . Acute myocardial infarction, subendocardial infarction, initial episode of care ([place]) 07/21/2012  . Acute osteomyelitis involving ankle and foot ([place]) 02/06/2015  . Acute systolic heart failure ([place]) 07/21/2012   New onset 07/19/12; admission to [facility] ED. Elevated Troponins.  S/p 2D-echo with EF 20-25%.  S/p cardiac catheterization with stenting LAD.  Repeat 2D-echo 10/2011 with improved EF of 35%.   . Anemia   . Automatic implantable cardioverter-defibrillator in situ    a. MDT CRT-D 06/2014, SN: [id number]    . CAD (coronary artery disease)    a. cardiac cath 101/04/2012: PCI/DES to chronically occluded mLAD, consideration PCI to diag branch in 4 weeks.   . Cataract   . Chicken pox   . Chronic kidney disease   . Chronic systolic CHF (congestive heart failure) (HCC)    a. mixed ICM & NICM; b. EF 20-25% by echo 07/2012, mid-dist 2/3 of LV sev HK/AK, mild MR. echo 10/2012: EF 30-35%, sev HK ant-septal & inf walls, GR1DD, mild MR, PASP 33. c. echo 02/2013: EF 30%, GR1DD, mild MR. echo 04/2014: EF 30%, Septal-lat dyssynchrony, global HK, inf AK, GR1DD, mild MR. d. echo 10/2014: EF50-55%, WM nl,  GR1DD, septal mild paradox. e. echo 02/2015: EF 50-55%, wm   . Depression   . Heart attack ([place])   . Heel ulcer ([place]) 04/27/2015  . History of blood transfusion ~ 2011   "plasma; had neuropathy; couldn't walk"  . Hypertension   . LBBB (left bundle branch block)   . Neuromuscular disorder ([place])   . Neuropathy 2011  . Obesity, unspecified   . OSA on CPAP    Moderate with AHI 23/hr and now on CPAP at 16cm H2O.  Her DME is [facility]  . Pure hypercholesterolemia   . Type II diabetes mellitus ([place])   . Unspecified vitamin D deficiency     Past Surgical History:  Procedure Laterality Date  . AMPUTATION TOE Right 06/18/2015    Procedure: AMPUTATION TOE;  Surgeon: Samara Deist, DPM;  Location: [facility] ORS;  Service: Podiatry;  Laterality: Right;  . AMPUTATION TOE Left 10/08/2018   Procedure: AMPUTATION TOE LEFT 2ND;  Surgeon: Albertine Patricia, DPM;  Location: [facility] ORS;  Service: Podiatry;  Laterality: Left;  . BACK SURGERY    . BI-VENTRICULAR IMPLANTABLE CARDIOVERTER DEFIBRILLATOR N/A 07/06/2014   Procedure: BI-VENTRICULAR IMPLANTABLE CARDIOVERTER DEFIBRILLATOR  (CRT-D);  Surgeon: Deboraha Sprang, MD;  Location: [facility] CATH LAB;  Service: Cardiovascular;  Laterality: N/A;  . BI-VENTRICULAR IMPLANTABLE CARDIOVERTER DEFIBRILLATOR  (CRT-D)  07/06/2014  . BILATERAL OOPHORECTOMY  01/2011   ovarian cyst benign  . COLONOSCOPY WITH PROPOFOL Left 02/22/2015   Procedure: COLONOSCOPY WITH PROPOFOL;  Surgeon: Hulen Luster, MD;  Location: [facility] ENDOSCOPY;  Service: Endoscopy;  Laterality: Left;  . CORONARY ANGIOPLASTY WITH STENT PLACEMENT Left 07/2012   new onset systolic CHF; elevated troponins.  Cardiac catheterization with stenting to LAD; EF 15%.  2D-echo: EF 20-25%.  . ESOPHAGOGASTRODUODENOSCOPY N/A 02/22/2015   Procedure: ESOPHAGOGASTRODUODENOSCOPY (EGD);  Surgeon: Hulen Luster, MD;  Location: [facility] ENDOSCOPY;  Service: Endoscopy;  Laterality: N/A;  . INCISION AND DRAINAGE ABSCESS Right 2007   groin; with ICU stay due to sepsis.  Marland Kitchen LAPAROSCOPIC CHOLECYSTECTOMY  2011  . LEFT HEART CATH AND CORONARY ANGIOGRAPHY N/A 10/05/2018   Procedure: LEFT HEART CATH AND CORONARY ANGIOGRAPHY;  Surgeon: Minna Merritts, MD;  Location: [place] CV LAB;  Service: Cardiovascular;  Laterality: N/A;  . LEFT HEART CATHETERIZATION WITH CORONARY ANGIOGRAM N/A 07/21/2012   Procedure: LEFT HEART CATHETERIZATION WITH CORONARY ANGIOGRAM;  Surgeon: Jolaine Artist, MD;  Location: [facility] CATH LAB;  Service: Cardiovascular;  Laterality: N/A;  . [place]   L4-5  . PERCUTANEOUS CORONARY STENT INTERVENTION (PCI-S) N/A 07/23/2012   Procedure:  PERCUTANEOUS CORONARY STENT INTERVENTION (PCI-S);  Surgeon: Sherren Mocha, MD;  Location: [facility] CATH LAB;  Service: Cardiovascular;  Laterality: N/A;  . PERIPHERAL VASCULAR BALLOON ANGIOPLASTY Left 10/06/2018   Procedure: PERIPHERAL VASCULAR BALLOON ANGIOPLASTY;  Surgeon: Katha Cabal, MD;  Location: [place] CV LAB;  Service: Cardiovascular;  Laterality: Left;  . PERIPHERAL VASCULAR CATHETERIZATION N/A 02/10/2015   Procedure: Picc Line Insertion;  Surgeon: Katha Cabal, MD;  Location: [place] CV LAB;  Service: Cardiovascular;  Laterality: N/A;  . TEE WITHOUT CARDIOVERSION N/A 10/02/2018   Procedure: TRANSESOPHAGEAL ECHOCARDIOGRAM (TEE);  Surgeon: Wellington Hampshire, MD;  Location: [facility] ORS;  Service: Cardiovascular;  Laterality: N/A;  . [place]  . VAGINAL HYSTERECTOMY  01/2011   Fibroids/DUB.  Ovaries removed. [place].    Social History   Socioeconomic History  . Marital status: Married    Spouse name: [name]  . Number of  children: 2  . Years of education: 48  . Highest education level: High school graduate  Occupational History  . Occupation: disabled    Fish farm manager: UNEMPLOYED    Comment: 03/2010 for peripheral neuropathy  . 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  Social Needs  . Financial resource strain: Not hard at all  . Food insecurity:    Worry: Never true    Inability: Never true  . Transportation needs:    Medical: No    Non-medical: No  Tobacco Use  . Smoking status: Never Smoker  . Smokeless tobacco: Never Used  Substance and Sexual Activity  . Alcohol use: No    Alcohol/week: 0.0 standard drinks  . Drug use: No  . Sexual activity: Never    Birth control/protection: Post-menopausal, Surgical  Lifestyle  . Physical activity:    Days per week: 0 days    Minutes per session: 0 min  . Stress: Rather much  Relationships  . Social connections:    Talks on phone: More than three times a week    Gets together: More than three  times a week    Attends religious service: Never    Active member of club or organization: No    Attends meetings of clubs or organizations: Never    Relationship status: Married  . Intimate partner violence:    Fear of current or ex partner: No    Emotionally abused: No    Physically abused: No    Forced sexual activity: No  Other Topics Concern  . Not on file  Social History Narrative   Marital status:Married x 45 yrs. Happily married, no abuse.       Children:  2 children (daughter 58, son 65). Two grandsons and 2 step grandchildren.  1 gg.      Lives: with husband, daughter, son-in-law, 2 grandsons, and 1 on the way.      Employment: disability for peripheral neuropathy 2012.  Previously had home daycare.      Tobacco: none      Alcohol: none      Drugs: none      Exercise: none. [employer].      Pets: dog.      Always uses seat belts, smoke detectors in home.      No guns in the home.       Caffeine use: 2 cups coffee per day.       Nutrition: Well balanced diet.    Family History  Problem Relation Age of Onset  . Diabetes Mother   . Hypertension Mother   . Arthritis Mother        knees, lumbar DDD, cervical DDD  . Cancer Father        prostate,skin,lymphoma.  . Cancer Brother 40       bladder cancer; non-smoker  . Diabetes Maternal Grandmother   . Heart disease Maternal Grandmother   . COPD Maternal Grandmother   . Diabetes Paternal Grandmother   . Obesity Brother   . Diabetes Son   . Hypertension Son   . Cancer Maternal Grandfather   . Diabetes Paternal Grandfather    No Known Allergies  ? Current Facility-Administered Medications  Medication Dose Route Frequency Provider Last Rate Last Dose  . 0.9 %  sodium chloride infusion  250 mL Intravenous PRN Pyreddy, Reatha Harps, MD      . acetaminophen (TYLENOL) tablet 650 mg  650 mg Oral Q6H PRN Saundra Shelling, MD  Or  . acetaminophen (TYLENOL) suppository 650 mg  650 mg Rectal Q6H PRN Pyreddy, Pavan, MD       . albuterol (PROVENTIL) (2.5 MG/3ML) 0.083% nebulizer solution 2.5 mg  2.5 mg Nebulization Q4H PRN Pyreddy, Pavan, MD      . ceFAZolin (ANCEF) IVPB 2g/100 mL premix  2 g Intravenous Q8H Pyreddy, Pavan, MD 200 mL/hr at 12/15/18 1309 2 g at 12/15/18 1309  . heparin injection 5,000 Units  5,000 Units Subcutaneous Q8H Saundra Shelling, MD   5,000 Units at 12/15/18 1258  . HYDROcodone-acetaminophen (NORCO/VICODIN) 5-325 MG per tablet 1-2 tablet  1-2 tablet Oral Q4H PRN Pyreddy, Pavan, MD      . insulin aspart (novoLOG) injection 0-15 Units  0-15 Units Subcutaneous TID WC Saundra Shelling, MD   11 Units at 12/15/18 1258  . insulin aspart (novoLOG) injection 0-5 Units  0-5 Units Subcutaneous QHS Pyreddy, Pavan, MD      . insulin glargine (LANTUS) injection 16 Units  16 Units Subcutaneous QHS Saundra Shelling, MD   16 Units at 12/14/18 2137  . ondansetron (ZOFRAN) tablet 4 mg  4 mg Oral Q6H PRN Saundra Shelling, MD       Or  . ondansetron (ZOFRAN) injection 4 mg  4 mg Intravenous Q6H PRN Pyreddy, Pavan, MD      . senna-docusate (Senokot-S) tablet 1 tablet  1 tablet Oral QHS PRN Pyreddy, Reatha Harps, MD      . sodium chloride flush (NS) 0.9 % injection 3 mL  3 mL Intravenous Q12H Saundra Shelling, MD   3 mL at 12/15/18 0854  . sodium chloride flush (NS) 0.9 % injection 3 mL  3 mL Intravenous PRN Pyreddy, Reatha Harps, MD         Abtx:  Anti-infectives (From admission, onward)   Start     Dose/Rate Route Frequency Ordered Stop   12/15/18 0900  rifampin (RIFADIN) capsule 300 mg  Status:  Discontinued     300 mg Oral Every 8 hours 12/15/18 0848 12/15/18 1310   12/14/18 1915  ceFAZolin (ANCEF) IVPB 2g/100 mL premix     2 g 200 mL/hr over 30 Minutes Intravenous Every 8 hours 12/14/18 1909        REVIEW OF SYSTEMS:  Const: negative fever, negative chills, some weight loss Eyes: negative diplopia or visual changes, negative eye pain ENT: negative coryza, negative sore throat Resp: negative cough, hemoptysis,  dyspnea Cards: negative for chest pain, palpitations, lower extremity edema GU: negative for frequency, dysuria and hematuria GI: Negative for abdominal pain, diarrhea, bleeding, constipation Skin: negative for rash and pruritus Heme: negative for easy bruising and gum/nose bleeding MS: negative for myalgias, arthralgias, some back pain and muscle weakness Neurolo:negative for headaches, dizziness, vertigo, memory problems  Psych: negative for feelings of anxiety, depression  Endocrine:no polyuria  Allergy/Immunology- negative for any medication or food allergies ?  Objective:  VITALS:  BP 137/71 (BP Location: Left Arm)   Pulse 91   Temp 98.1 F (36.7 C) (Oral)   Resp 14   Ht 5\' 8"  (1.727 m)   Wt 127.9 kg   SpO2 95%   BMI 42.88 kg/m  PHYSICAL EXAM:  General: Alert, cooperative, no distress, appears stated age. pale Head: Normocephalic, without obvious abnormality, atraumatic. Eyes: Conjunctivae clear, anicteric sclerae. Pupils are equal ENT Nares normal. No drainage or sinus tenderness. Lips, mucosa, and tongue normal. No Thrush Neck: Supple, symmetrical, no adenopathy, thyroid: non tender no carotid bruit and no JVD. Back: No CVA tenderness. Lungs: b/l  air entry- crepts bases. Heart: s1s2 cehst wall- site of ICD -well copated with glue- no bruising or hematoma    Abdomen: Soft, non-tender,not distended. Bowel sounds normal. No masses Extremities: edema ankles much less B/l 2nd toe amputation- site fine Skin: No rashes or lesions. Or bruising Lymph: Cervical, supraclavicular normal. Neurologic: Grossly non-focal Pertinent Labs Lab Results CBC    Component Value Date/Time   WBC 4.3 12/15/2018 0452   RBC 2.71 (L) 12/15/2018 0452   HGB 7.7 (L) 12/15/2018 0452   HGB 7.9 (L) 10/22/2018 1419   HCT 25.3 (L) 12/15/2018 0452   HCT 24.8 (L) 10/22/2018 1419   PLT 203 12/15/2018 0452   PLT 170 10/22/2018 1419   MCV 93.4 12/15/2018 0452   MCV 91 10/22/2018 1419   MCH  28.4 12/15/2018 0452   MCHC 30.4 12/15/2018 0452   RDW 16.9 (H) 12/15/2018 0452   RDW 13.0 10/22/2018 1419   LYMPHSABS 0.3 (L) 11/22/2018 1116   LYMPHSABS 1.7 04/15/2018 1532   MONOABS 0.3 11/22/2018 1116   EOSABS 0.1 11/22/2018 1116   EOSABS 0.1 04/15/2018 1532   BASOSABS 0.0 11/22/2018 1116   BASOSABS 0.0 04/15/2018 1532    CMP Latest Ref Rng & Units 12/15/2018 12/14/2018 12/14/2018  Glucose 70 - 99 mg/dL 163(H) - 152(H)  BUN 8 - 23 mg/dL 27(H) - 28(H)  Creatinine 0.44 - 1.00 mg/dL 1.14(H) 1.14(H) 1.18(H)  Sodium 135 - 145 mmol/L 138 - 135  Potassium 3.5 - 5.1 mmol/L 4.0 - 4.4  Chloride 98 - 111 mmol/L 100 - 95(L)  CO2 22 - 32 mmol/L 31 - 28  Calcium 8.9 - 10.3 mg/dL 8.4(L) - 8.0(L)  Total Protein 6.5 - 8.1 g/dL - - -  Total Bilirubin 0.3 - 1.2 mg/dL - - -  Alkaline Phos 38 - 126 U/L - - -  AST 15 - 41 U/L - - -  ALT 0 - 44 U/L - - -      Microbiology: 3/10 BC   IMAGING RESULTS: Enlarged cardiac silhouette. 2. Persistent perifissural thickening, likely small amount of pleural fluid.   I have personally reviewed the films ? Impression/Recommendation ? 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 Blood culture from 2/25 and 2/29 Neg for MSSA Another culture sent today Will need 6 weeks of IV cefazolin 2 grams Q 8 from the day of explantation Until 01/22/19 Septic emboli to the lungs at [facility]- resolved ? ?AKI on CKD- has resolved- crcl> 50 now  1st episode of MSSA bacteremia in Dec 2019 with abscess/osteomyelitis of 2nd left toe leading to amputation on 10/08/18 with 6 weeks of IV cefazolin which she completed on 11/17/18  CHF /CAD-S/p stent management as per cardiology  Thrombocytopenia has resolved  Anemia: has received PRBC many times in the past  Will follow her in my clinic  after  discharge ___________________________________________________ Discussed with patient, requesting provider

## 2018-12-15 NOTE — Progress Notes (Signed)
Please note, patient is currently followed by outpatient Palliative at home. CMRN Geoffry Paradise made aware. Flo Shanks BSN, RN, [facility] [employer]  [phone]

## 2018-12-15 NOTE — Consult Note (Signed)
PHARMACY CONSULT NOTE FOR:  OUTPATIENT  PARENTERAL ANTIBIOTIC THERAPY (OPAT)  Indication: MSSA BACTEREMIA, TRICUSPID VALVE ENDOCARDITIS/MITRAL VALVE VEGETATION Regimen: CEFAZOLIN 2 GRAMS IV EVERY 8 HOURS  End date: 01/22/2019  IV antibiotic discharge orders are pended. To discharging provider:  please sign these orders via discharge navigator,  Select New Orders & click on the button choice - Manage This Unsigned Work.     Thank you for allowing pharmacy to be a part of this patient's care.  [name] A Margret Moat 12/15/2018, 3:27 PM

## 2018-12-15 NOTE — Progress Notes (Signed)
Peripherally Inserted Central Catheter/Midline Placement  The IV Nurse has discussed with the patient and/or persons authorized to consent for the patient, the purpose of this procedure and the potential benefits and risks involved with this procedure.  The benefits include less needle sticks, lab draws from the catheter, and the patient may be discharged home with the catheter. Risks include, but not limited to, infection, bleeding, blood clot (thrombus formation), and puncture of an artery; nerve damage and irregular heartbeat and possibility to perform a PICC exchange if needed/ordered by physician.  Alternatives to this procedure were also discussed.  Bard Power PICC patient education guide, fact sheet on infection prevention and patient information card has been provided to patient /or left at bedside.    PICC/Midline Placement Documentation  PICC Single Lumen 82/42/35 PICC Right Basilic 41 cm 0 cm (Active)  Indication for Insertion or Continuance of Line Home intravenous therapies (PICC only) 12/15/2018  8:40 PM  Exposed Catheter (cm) 0 cm 12/15/2018  8:40 PM  Site Assessment Clean;Dry;Intact 12/15/2018  8:40 PM  Line Status Flushed;Saline locked;Blood return noted 12/15/2018  8:40 PM  Dressing Type Transparent 12/15/2018  8:40 PM  Dressing Status Clean;Dry;Intact;Antimicrobial disc in place 12/15/2018  8:40 PM  Dressing Change Due 12/22/18 12/15/2018  8:40 PM       Gordan Payment 12/15/2018, 8:41 PM

## 2018-12-15 NOTE — Progress Notes (Signed)
[place] [facility] [place] NAME: [name]    MR#:  [id number]  DATE OF BIRTH:  1955-12-06  SUBJECTIVE:  CHIEF COMPLAINT:  No chief complaint on file.  -Patient with recurrent MSSA bacteremia was sent to [facility] from our hospital 2 weeks ago for ICD removal, patient had her ICD removed and tricuspid valve vegetation removed and is being sent from [facility] for further management. -Patient denies any complaints other than soreness at the site of ICD removal. -Awaiting ID input  REVIEW OF SYSTEMS:  Review of Systems  Constitutional: Negative for chills, fever and malaise/fatigue.  HENT: Negative for hearing loss.   Eyes: Negative for blurred vision and double vision.  Respiratory: Negative for cough, shortness of breath and wheezing.   Cardiovascular: Negative for chest pain, palpitations and leg swelling.  Gastrointestinal: Negative for abdominal pain, constipation, diarrhea, nausea and vomiting.  Genitourinary: Negative for dysuria.  Musculoskeletal: Positive for myalgias.  Neurological: Negative for dizziness, speech change, focal weakness, seizures, weakness and headaches.  Psychiatric/Behavioral: Negative for depression.    DRUG ALLERGIES:  No Known Allergies  VITALS:  Blood pressure 137/71, pulse 91, temperature 98.1 F (36.7 C), temperature source Oral, resp. rate 14, height 5\' 8"  (1.727 m), weight 127.9 kg, SpO2 95 %.  PHYSICAL EXAMINATION:  Physical Exam   GENERAL:  63 y.o.-year-old patient lying in the bed with no acute distress.  EYES: Pupils equal, round, reactive to light and accommodation. No scleral icterus. Extraocular muscles intact.  HEENT: Head atraumatic, normocephalic. Oropharynx and nasopharynx clear.  NECK:  Supple, no jugular venous distention. No thyroid enlargement, no tenderness.  LUNGS: Normal breath sounds bilaterally, no wheezing, rales,rhonchi or crepitation. No use of accessory muscles of respiration. Decreased  bibasilar breath sounds CARDIOVASCULAR: S1, S2 normal. No murmurs, rubs, or gallops.  Dressing in place on left chest with minimal tenderness around site ABDOMEN: Soft, nontender, nondistended. Bowel sounds present. No organomegaly or mass.  EXTREMITIES: No pedal edema, cyanosis, or clubbing.  NEUROLOGIC: Cranial nerves II through XII are intact. Muscle strength 5/5 in all extremities. Sensation intact. Gait not checked.  PSYCHIATRIC: The patient is alert and oriented x 3.  SKIN: No obvious rash, lesion, or ulcer.    LABORATORY PANEL:   CBC Recent Labs  Lab 12/15/18 0452  WBC 4.3  HGB 7.7*  HCT 25.3*  PLT 203   ------------------------------------------------------------------------------------------------------------------  Chemistries  Recent Labs  Lab 12/15/18 0452  NA 138  K 4.0  CL 100  CO2 31  GLUCOSE 163*  BUN 27*  CREATININE 1.14*  CALCIUM 8.4*   ------------------------------------------------------------------------------------------------------------------  Cardiac Enzymes No results for input(s): TROPONINI in the last 168 hours. ------------------------------------------------------------------------------------------------------------------  RADIOLOGY:  [place] Ekg Site Rite  Result Date: 12/14/2018 If Site Rite image not attached, placement could not be confirmed due to current cardiac rhythm.   EKG:   Orders placed or performed during the hospital encounter of 11/22/18  . ED EKG  . ED EKG  . EKG 12-Lead  . EKG 12-Lead  . EKG 12-Lead  . EKG 12-Lead    ASSESSMENT AND PLAN:   63 year old female with past medical history significant for CAD, chronic systolic heart failure status post ICD, diabetes, hypertension, sleep apnea sent from [facility] after ICD has been removed for MSSA endocarditis and bacteremia.  1.  Recurrent MSSA bacteremia and tricuspid endocarditis-initial source of infection was left foot second toe which was amputated couple of months  ago. -TTE is done within the last  2 months did not show any agitations. -Once the outpatient antibiotics were discontinued, patient had recurrent MSSA bacteremia.  So she was admitted to [facility] for further treatment and was transferred to [facility] for removal of her ICD leads. -Repeat TEE at [facility] showing tricuspid endocarditis status post angiovac on 12/04/18 and ICD removal on 12/11/18 -Awaiting infectious disease input for duration of antibiotics. -Might need PICC line.  Continue Ancef for now.  Rifampin has been stopped given ICD removal recently. -Repeat blood cultures ordered.  2.  Ischemic cardiomyopathy-since ICD is remote, concern is if she will need a LifeVest at discharge -Cardiology has been consulted -Recent TEE showing EF of 25 to 30% at [facility]  3.  Diabetes mellitus-on Lantus and sliding scale insulin  4.  DVT prophylaxis-subcutaneous heparin  Encourage ambulation     All the records are reviewed and case discussed with Care Management/Social Workerr. Management plans discussed with the patient, family and they are in agreement.  CODE STATUS: Full Code  TOTAL TIME TAKING CARE OF THIS PATIENT: 38 minutes.   POSSIBLE D/C IN 1-2  DAYS, DEPENDING ON CLINICAL CONDITION.   Gladstone Lighter M.D on 12/15/2018 at 12:58 PM  Between 7am to 6pm - Pager - [phone]  After 6pm go to [email or web address] - password [email or web address] [facility]  Office  [phone]  CC: Primary care physician; Rutherford Guys, MD

## 2018-12-16 DIAGNOSIS — R0600 Dyspnea, unspecified: Secondary | ICD-10-CM

## 2018-12-16 LAB — BASIC METABOLIC PANEL
Anion gap: 10 (ref 5–15)
BUN: 22 mg/dL (ref 8–23)
CALCIUM: 8.7 mg/dL — AB (ref 8.9–10.3)
CO2: 29 mmol/L (ref 22–32)
Chloride: 100 mmol/L (ref 98–111)
Creatinine, Ser: 1.04 mg/dL — ABNORMAL HIGH (ref 0.44–1.00)
GFR calc Af Amer: >60 mL/min (ref >60)
GFR calc non Af Amer: 57 mL/min — ABNORMAL LOW (ref >60)
Glucose, Bld: 136 mg/dL — ABNORMAL HIGH (ref 70–99)
Potassium: 4 mmol/L (ref 3.5–5.1)
SODIUM: 139 mmol/L (ref 135–145)

## 2018-12-16 LAB — GLUCOSE, CAPILLARY
Glucose-Capillary: 124 mg/dL — ABNORMAL HIGH (ref 70–99)
Glucose-Capillary: 234 mg/dL — ABNORMAL HIGH (ref 70–99)

## 2018-12-16 LAB — CBC
HCT: 26.9 % — ABNORMAL LOW (ref 36.0–46.0)
Hemoglobin: 8.3 g/dL — ABNORMAL LOW (ref 12.0–15.0)
MCH: 28.6 pg (ref 26.0–34.0)
MCHC: 30.9 g/dL (ref 30.0–36.0)
MCV: 92.8 fL (ref 80.0–100.0)
Platelets: 231 10*3/uL (ref 150–400)
RBC: 2.9 MIL/uL — ABNORMAL LOW (ref 3.87–5.11)
RDW: 16.6 % — AB (ref 11.5–15.5)
WBC: 4.7 10*3/uL (ref 4.0–10.5)
nRBC: 0 % (ref 0.0–0.2)

## 2018-12-16 MED ORDER — LOSARTAN POTASSIUM 50 MG PO TABS: 50.0000 mg | ORAL_TABLET | Freq: Every day | ORAL | 2 refills | Status: DC

## 2018-12-16 MED ORDER — CEFAZOLIN IV (FOR PTA / DISCHARGE USE ONLY): 2.0000 g | Freq: Three times a day (TID) | INTRAVENOUS | 0 refills | Status: AC

## 2018-12-16 MED ORDER — CARVEDILOL 6.25 MG PO TABS: 6.2500 mg | ORAL_TABLET | Freq: Two times a day (BID) | ORAL | 2 refills | Status: DC

## 2018-12-16 MED ORDER — TORSEMIDE 20 MG PO TABS: 20.0000 mg | ORAL_TABLET | Freq: Every day | ORAL | 1 refills | Status: DC

## 2018-12-16 NOTE — Discharge Summary (Signed)
[place] [facility] [place] NAME: [name]    MR#:  [id number]  DATE OF BIRTH:  [birth year]  DATE OF ADMISSION:  12/14/2018   ADMITTING PHYSICIAN: Saundra Shelling, MD  DATE OF DISCHARGE:  12/16/18  PRIMARY CARE PHYSICIAN: Rutherford Guys, MD   ADMISSION DIAGNOSIS:   infection  DISCHARGE DIAGNOSIS:   Active Problems:   Endocarditis   SECONDARY DIAGNOSIS:   Past Medical History:  Diagnosis Date  . Acute myocardial infarction, subendocardial infarction, initial episode of care ([place]) 07/21/2012  . Acute osteomyelitis involving ankle and foot ([place]) 02/06/2015  . Acute systolic heart failure ([place]) 07/21/2012   New onset 07/19/12; admission to [facility] ED. Elevated Troponins.  S/p 2D-echo with EF 20-25%.  S/p cardiac catheterization with stenting LAD.  Repeat 2D-echo 10/2011 with improved EF of 35%.   . Anemia   . Automatic implantable cardioverter-defibrillator in situ    a. MDT CRT-D 06/2014, SN: [id number]    . CAD (coronary artery disease)    a. cardiac cath 101/04/2012: PCI/DES to chronically occluded mLAD, consideration PCI to diag branch in 4 weeks.   . Cataract   . Chicken pox   . Chronic kidney disease   . Chronic systolic CHF (congestive heart failure) (HCC)    a. mixed ICM & NICM; b. EF 20-25% by echo 07/2012, mid-dist 2/3 of LV sev HK/AK, mild MR. echo 10/2012: EF 30-35%, sev HK ant-septal & inf walls, GR1DD, mild MR, PASP 33. c. echo 02/2013: EF 30%, GR1DD, mild MR. echo 04/2014: EF 30%, Septal-lat dyssynchrony, global HK, inf AK, GR1DD, mild MR. d. echo 10/2014: EF50-55%, WM nl, GR1DD, septal mild paradox. e. echo 02/2015: EF 50-55%, wm   . Depression   . Heart attack ([place])   . Heel ulcer ([place]) 04/27/2015  . History of blood transfusion ~ 2011   "plasma; had neuropathy; couldn't walk"  . Hypertension   . LBBB (left bundle branch block)   . Neuromuscular disorder ([place])   . Neuropathy 2011  . Obesity, unspecified   . OSA on CPAP     Moderate with AHI 23/hr and now on CPAP at 16cm H2O.  Her DME is [facility]  . Pure hypercholesterolemia   . Type II diabetes mellitus ([place])   . Unspecified vitamin D deficiency     HOSPITAL COURSE:   63 year old female with past medical history significant for CAD, chronic systolic heart failure status post ICD, diabetes, hypertension, sleep apnea sent from [facility] after ICD has been removed for MSSA endocarditis and bacteremia.  1.  Recurrent MSSA bacteremia and tricuspid endocarditis-initial source of infection was left foot second toe which was amputated couple of months ago. -TTE is done within the last 2 months did not show any agitations. -Once the outpatient antibiotics were discontinued, patient had recurrent MSSA bacteremia.  So she was admitted to [facility] for further treatment and was transferred to [facility] for removal of her ICD leads. -Repeat TEE at [facility] showing tricuspid endocarditis status post angiovac on 12/04/18 and ICD removal on 12/11/18. -Repeat blood cultures on 12/15/2018 is negative so far -Appreciate ID input.  PICC line has been placed and patient will be discharged on IV Ancef until 01/22/2019  2.  Ischemic cardiomyopathy-since ICD is removed, no need for LifeVest at discharge per cardiology. -Cardiology has been consulted -Recent TEE showing EF of 25 to 30% at [facility]. -will be discharged on heart failure medications-Coreg, losartan and torsemide  3.  Diabetes mellitus-on  Lantus and aspart insulin  4.  CAD- stable, on asa and plavix  Ambulating well.  Will be discharged home with home health today  Encourage ambulation  DISCHARGE CONDITIONS:   Guarded  CONSULTS OBTAINED:   Treatment Team:  Minna Merritts, MD Tsosie Billing, MD  DRUG ALLERGIES:   No Known Allergies DISCHARGE MEDICATIONS:   Allergies as of 12/16/2018   No Known Allergies     Medication List    TAKE these medications   albuterol (2.5 MG/3ML) 0.083% nebulizer solution  Commonly known as:  PROVENTIL Take 3 mLs (2.5 mg total) by nebulization every 4 (four) hours as needed for wheezing or shortness of breath.   aspirin 81 MG EC tablet Take 1 tablet (81 mg total) by mouth daily.   carvedilol 6.25 MG tablet Commonly known as:  COREG Take 1 tablet (6.25 mg total) by mouth 2 (two) times daily with a meal.   ceFAZolin  IVPB Commonly known as:  ANCEF Inject 2 g into the vein every 8 (eight) hours. Indication:  MSSA Bacteremia, Tricuspid valve endocarditis/Mitral Valve Vegetation  Last Day of Therapy:  01/22/2019 Labs - Once weekly:  CBC/D and CMP, Labs - Every other week:  ESR and CRP Please leave PICC in place until doctor has seen patient or been notified.   clopidogrel 75 MG tablet Commonly known as:  PLAVIX Take 1 tablet (75 mg total) by mouth daily.   insulin aspart 100 UNIT/ML injection Commonly known as:  novoLOG Inject 0-15 Units into the skin 3 (three) times daily with meals.   insulin aspart 100 UNIT/ML injection Commonly known as:  novoLOG Inject 0-5 Units into the skin at bedtime.   insulin glargine 100 UNIT/ML injection Commonly known as:  LANTUS Inject 0.16 mLs (16 Units total) into the skin at bedtime.   losartan 50 MG tablet Commonly known as:  COZAAR Take 1 tablet (50 mg total) by mouth daily. Start taking on:  December 17, 2018   ondansetron 4 MG/2ML Soln injection Commonly known as:  ZOFRAN Inject 2 mLs (4 mg total) into the vein every 6 (six) hours as needed for nausea.   torsemide 20 MG tablet Commonly known as:  DEMADEX Take 1 tablet (20 mg total) by mouth daily. Start taking on:  December 17, 2018            Home Infusion Instuctions  (From admission, onward)         Start     Ordered   12/16/18 0000  Home infusion instructions [facility] May follow [place] Dosing Protocol; May administer Cathflo as needed to maintain patency of vascular access device.; Flushing of vascular access device: per [facility]  Protocol: 0.9% NaCl pre/post medica...    Question Answer Comment  Instructions May follow [place] Dosing Protocol   Instructions May administer Cathflo as needed to maintain patency of vascular access device.   Instructions Flushing of vascular access device: per [facility] Protocol: 0.9% NaCl pre/post medication administration and prn patency; Heparin 100 u/ml, 8m for implanted ports and Heparin 10u/ml, 576mfor all other central venous catheters.   Instructions May follow [facility] Anaphylaxis Protocol for First Dose Administration in the home: 0.9% NaCl at 25-50 ml/hr to maintain IV access for protocol meds. Epinephrine 0.3 ml IV/IM PRN and Benadryl 25-50 IV/IM PRN s/s of anaphylaxis.   Instructions [facility] Infusion Coordinator (RN) to assist per patient IV care needs in the home PRN.      12/16/18 1037  DISCHARGE INSTRUCTIONS:   1.  PCP follow-up in 1 to 2 weeks 2.  Cardiology follow-up in 3 weeks 3.  ID follow-up in 3 weeks 4.  Nephrology follow-up in 3 to 4 weeks  DIET:   Cardiac diet  ACTIVITY:   Activity as tolerated  OXYGEN:   Home Oxygen: No.  Oxygen Delivery: room air  DISCHARGE LOCATION:   home   If you experience worsening of your admission symptoms, develop shortness of breath, life threatening emergency, suicidal or homicidal thoughts you must seek medical attention immediately by calling 911 or calling your MD immediately  if symptoms less severe.  You Must read complete instructions/literature along with all the possible adverse reactions/side effects for all the Medicines you take and that have been prescribed to you. Take any new Medicines after you have completely understood and accpet all the possible adverse reactions/side effects.   Please note  You were cared for by a hospitalist during your hospital stay. If you have any questions about your discharge medications or the care you received while you were in the hospital after you are  discharged, you can call the unit and asked to speak with the hospitalist on call if the hospitalist that took care of you is not available. Once you are discharged, your primary care physician will handle any further medical issues. Please note that NO REFILLS for any discharge medications will be authorized once you are discharged, as it is imperative that you return to your primary care physician (or establish a relationship with a primary care physician if you do not have one) for your aftercare needs so that they can reassess your need for medications and monitor your lab values.    On the day of Discharge:  VITAL SIGNS:   Blood pressure 137/65, pulse 87, temperature 98.3 F (36.8 C), temperature source Oral, resp. rate 15, height _0  (1.727 m), weight 126.6 kg, SpO2 97 %.  PHYSICAL EXAMINATION:    GENERAL:  63 y.o.-year-old patient lying in the bed with no acute distress.  EYES: Pupils equal, round, reactive to light and accommodation. No scleral icterus. Extraocular muscles intact.  HEENT: Head atraumatic, normocephalic. Oropharynx and nasopharynx clear.  NECK:  Supple, no jugular venous distention. No thyroid enlargement, no tenderness.  LUNGS: Normal breath sounds bilaterally, no wheezing, rales,rhonchi or crepitation. No use of accessory muscles of respiration. Decreased bibasilar breath sounds CARDIOVASCULAR: S1, S2 normal. No murmurs, rubs, or gallops.  Dressing in place on left chest with minimal tenderness around site ABDOMEN: Soft, nontender, nondistended. Bowel sounds present. No organomegaly or mass.  EXTREMITIES: No pedal edema, cyanosis, or clubbing.  NEUROLOGIC: Cranial nerves II through XII are intact. Muscle strength 5/5 in all extremities. Sensation intact. Gait not checked.  PSYCHIATRIC: The patient is alert and oriented x 3.  SKIN: No obvious rash, lesion, or ulcer.   DATA REVIEW:   CBC Recent Labs  Lab 12/16/18 0341  WBC 4.7  HGB 8.3*  HCT 26.9*  PLT 231     Chemistries  Recent Labs  Lab 12/16/18 0341  NA 139  K 4.0  CL 100  CO2 29  GLUCOSE 136*  BUN 22  CREATININE 1.04*  CALCIUM 8.7*     Microbiology Results  Results for orders placed or performed during the hospital encounter of 12/14/18  CULTURE, BLOOD (ROUTINE X 2) w Reflex to ID Panel     Status: None (Preliminary result)   Collection Time: 12/15/18 11:36 AM  Result Value Ref Range  Status   Specimen Description BLOOD LEFT AC  Final   Special Requests   Final    BOTTLES DRAWN AEROBIC AND ANAEROBIC Blood Culture adequate volume   Culture   Final    NO GROWTH < 24 HOURS Performed at [facility], [address] [place], [place] [address]    Report Status PENDING  Incomplete  CULTURE, BLOOD (ROUTINE X 2) w Reflex to ID Panel     Status: None (Preliminary result)   Collection Time: 12/15/18 11:36 AM  Result Value Ref Range Status   Specimen Description BLOOD RIGHT [facility]  Final   Special Requests   Final    BOTTLES DRAWN AEROBIC AND ANAEROBIC Blood Culture adequate volume   Culture   Final    NO GROWTH < 24 HOURS Performed at [facility], [address] [place], [place] [address]    Report Status PENDING  Incomplete    RADIOLOGY:  Dg Chest 2 View  Result Date: 12/15/2018 CLINICAL DATA:  ICD associated bacteremia. EXAM: CHEST - 2 VIEW COMPARISON:  11/26/2018 FINDINGS: Cardiac defibrillator has been removed. Right IJ approach central venous catheter has been removed. Enlarged cardiac silhouette. Calcific atherosclerotic disease of the aorta. Mediastinal contours appear intact. There is no evidence of focal airspace consolidation or pneumothorax. Persistent perifissural thickening, likely small amount of pleural fluid. Osseous structures are without acute abnormality. Soft tissues are grossly normal. IMPRESSION: 1. Enlarged cardiac silhouette. 2. Persistent perifissural thickening, likely small amount of pleural fluid. Electronically Signed   By: Fidela Salisbury M.D.   On: 12/15/2018 15:17   [place] Ekg Site Rite  Result Date: 12/15/2018 If Site Rite image not attached, placement could not be confirmed due to current cardiac rhythm.    Management plans discussed with the patient, family and they are in agreement.  CODE STATUS:     Code Status Orders  (From admission, onward)         Start     Ordered   12/14/18 1813  Full code  Continuous     12/14/18 1812        Code Status History    Date Active Date Inactive Code Status Order ID Comments User Context   11/22/2018 1543 11/29/2018 0519 Full Code [id number]  Gorden Harms, MD Inpatient   11/22/2018 1543 11/22/2018 1543 Full Code [id number]  Gorden Harms, MD Inpatient   10/31/2018 0232 11/04/2018 1826 Full Code [id number]  Lance Coon, MD Inpatient   10/13/2018 0047 10/16/2018 1900 DNR [id number]  Gorden Harms, MD Inpatient   09/28/2018 1456 10/09/2018 1726 Full Code [id number]  Nicholes Mango, MD ED   11/11/2015 1411 11/12/2015 1635 Full Code [id number]  Vaughan Basta, MD Inpatient   06/17/2015 0148 06/19/2015 1659 Full Code [id number]  Harrie Foreman, MD Inpatient   02/17/2015 1330 02/24/2015 1925 DNR [id number]  Bettey Costa, MD ED   07/06/2014 1100 07/07/2014 1524 Full Code [id number]  Deboraha Sprang, MD Inpatient   07/22/2012 1427 07/26/2012 1507 DNR [id number]  Nolon Rod, DO Inpatient    Advance Directive Documentation     Most Recent Value  Type of Advance Directive  Living will  Pre-existing out of facility DNR order (yellow form or pink MOST form)  -  "MOST" Form in Place?  -      TOTAL TIME TAKING CARE OF THIS PATIENT: 38 minutes.    Gladstone Lighter M.D on 12/16/2018 at 12:22 PM  Between 7am to 6pm - Pager - [phone]  After 6pm go to [email or web address] - Proofreader  [facility]  Office  [phone]  CC: Primary care physician; Rutherford Guys, MD   Note: This dictation was prepared with Dragon dictation along with  smaller phrase technology. Any transcriptional errors that result from this process are unintentional.

## 2018-12-16 NOTE — TOC Transition Note (Signed)
Transition of Care [facility]) - CM/SW Discharge Note   Patient Details  Name: PHYNIX HORTON MRN: [id number] Date of Birth: 1956-08-30  Transition of Care [facility]) CM/SW Contact:  Elza Rafter, RN Phone Number: [phone] 12/16/2018, 2:04 PM   Clinical Narrative:   Patient discharging today with husband.  Discharging on 6 weeks of antibiotics and home health services through [facility].  Jason with [facility] aware.  Pam With [facility] aware.      Final next level of care: Home w Home Health Services Barriers to Discharge: Other (comment)(Pending ID consult and PICC line Placement for home antibiotics)   Patient Goals and CMS Choice Patient states their goals for this hospitalization and ongoing recovery are:: DC to home with husband with [facility] services through [place] Medicare.gov Compare Post Acute Care list provided to:: Patient Choice offered to / list presented to : Patient  Discharge Placement                       Discharge Plan and Services Discharge Planning Services: CM Consult Post Acute Care Choice: Home Health              HH Arranged: RN, PT, IV Antibiotics HH Agency: [place] ([facility])   Social Determinants of Health (SDOH) Interventions     Readmission Risk Interventions No flowsheet data found.

## 2018-12-16 NOTE — Plan of Care (Signed)

## 2018-12-16 NOTE — Progress Notes (Signed)
Progress Note  Patient Name: [name] Date of Encounter: 12/16/2018  Primary Cardiologist: Ida Rogue, MD , [facility]  Subjective   Feels well this morning, no complaints Denies any shortness of breath, abdominal bloating, leg swelling Restarted on some of her medications yesterday   Inpatient Medications    Scheduled Meds: . aspirin  81 mg Oral Daily  . carvedilol  6.25 mg Oral BID WC  . heparin  5,000 Units Subcutaneous Q8H  . insulin aspart  0-15 Units Subcutaneous TID WC  . insulin aspart  0-5 Units Subcutaneous QHS  . insulin glargine  16 Units Subcutaneous QHS  . losartan  50 mg Oral Daily  . sodium chloride flush  10-40 mL Intracatheter Q12H  . sodium chloride flush  3 mL Intravenous Q12H  . torsemide  20 mg Oral Daily   Continuous Infusions: . sodium chloride    .  ceFAZolin (ANCEF) IV 2 g (12/16/18 0542)  . iron sucrose Stopped (12/15/18 2334)   PRN Meds: sodium chloride, acetaminophen **OR** acetaminophen, albuterol, HYDROcodone-acetaminophen, ondansetron **OR** ondansetron (ZOFRAN) IV, senna-docusate, sodium chloride flush, sodium chloride flush   Vital Signs    Vitals:   12/15/18 1525 12/15/18 2238 12/16/18 0352 12/16/18 0836  BP: (!) 142/84 (!) 142/67 137/64 137/65  Pulse: 87 92 89 87  Resp: 18 18 18 15   Temp: 98 F (36.7 C) 98.3 F (36.8 C) 97.9 F (36.6 C) 98.3 F (36.8 C)  TempSrc: Oral Oral Oral Oral  SpO2: 100% 99% 99% 97%  Weight:   126.6 kg   Height:        Intake/Output Summary (Last 24 hours) at 12/16/2018 1052 Last data filed at 12/16/2018 1026 Gross per 24 hour  Intake 1865 ml  Output 2850 ml  Net -985 ml   Last 3 Weights 12/16/2018 12/15/2018 12/14/2018  Weight (lbs) 279 lb 3.2 oz 282 lb 276 lb 3 oz  Weight (kg) 126.644 kg 127.914 kg 125.278 kg  Some encounter information is confidential and restricted. Go to Review Flowsheets activity to see all data.      Telemetry    Normal sinus rhythm- Personally Reviewed  ECG      - Personally Reviewed  Physical Exam   GEN: No acute distress.  Obese Neck: No JVD Cardiac: RRR, no murmurs, rubs, or gallops.  Nonpitting lower extremity edema Respiratory: Clear to auscultation bilaterally. GI: Soft, nontender, non-distended  MS: No edema; No deformity. Neuro:  Nonfocal  Psych: Normal affect   Labs    Chemistry Recent Labs  Lab 12/14/18 1838 12/15/18 0452 12/16/18 0341  NA 135 138 139  K 4.4 4.0 4.0  CL 95* 100 100  CO2 28 31 29   GLUCOSE 152* 163* 136*  BUN 28* 27* 22  CREATININE 1.14*  1.18* 1.14* 1.04*  CALCIUM 8.0* 8.4* 8.7*  GFRNONAA 51*  49* 51* 57*  GFRAA 59*  56* 59* >60  ANIONGAP 12 7 10      Hematology Recent Labs  Lab 12/14/18 1838 12/15/18 0452 12/16/18 0341  WBC 5.3 4.3 4.7  RBC 2.84* 2.71* 2.90*  HGB 8.1* 7.7* 8.3*  HCT 26.5* 25.3* 26.9*  MCV 93.3 93.4 92.8  MCH 28.5 28.4 28.6  MCHC 30.6 30.4 30.9  RDW 16.9* 16.9* 16.6*  PLT 203 203 231    Cardiac EnzymesNo results for input(s): TROPONINI in the last 168 hours. No results for input(s): TROPIPOC in the last 168 hours.   BNPNo results for input(s): BNP, PROBNP in the last 168  hours.   DDimer No results for input(s): DDIMER in the last 168 hours.   Radiology    Dg Chest 2 View  Result Date: 12/15/2018 CLINICAL DATA:  ICD associated bacteremia. EXAM: CHEST - 2 VIEW COMPARISON:  11/26/2018 FINDINGS: Cardiac defibrillator has been removed. Right IJ approach central venous catheter has been removed. Enlarged cardiac silhouette. Calcific atherosclerotic disease of the aorta. Mediastinal contours appear intact. There is no evidence of focal airspace consolidation or pneumothorax. Persistent perifissural thickening, likely small amount of pleural fluid. Osseous structures are without acute abnormality. Soft tissues are grossly normal. IMPRESSION: 1. Enlarged cardiac silhouette. 2. Persistent perifissural thickening, likely small amount of pleural fluid. Electronically Signed    By: Fidela Salisbury M.D.   On: 12/15/2018 15:17   [place] Ekg Site Rite  Result Date: 12/15/2018 If Site Rite image not attached, placement could not be confirmed due to current cardiac rhythm.  [place] Ekg Site Rite  Result Date: 12/14/2018 If Site Rite image not attached, placement could not be confirmed due to current cardiac rhythm.   Cardiac Studies   Echocardiogram November 23, 2018  1. The left ventricle has mild-moderately reduced systolic function, with an ejection fraction of 40-45%. The cavity size was mildly dilated. Left ventricular diastolic Doppler parameters are consistent with pseudonormalization.  2. The right ventricle has normal systolic function. The cavity was normal. There is no increase in right ventricular wall thickness.  3. Left atrial size was mildly dilated.  Patient Profile     [name] a 63 y.o.femalewith a hx of CAD, mixed ischemic/nonischemic systolic and diastolic dysfunction status post CRT-D 2015 with improvement in EFto 45%,type 2 diabetes, hypertension, obesity, OSA prescribedCPAP, recentNSTEMI12/2019and2/2MSSA bacteremiaand s/p ICD [facility]at [facility], transferred [facility] to [facility] for further management  Assessment & Plan    A/P: Endocarditiswith h/o MSSA bacteremia -Recurrent MSSA bacteremia with osteomyelitis of foot;  Status post ICD removal at [name]   RA leadand tricuspid valve endocarditis.  --Management per ID Cefazolin 2 g IV every 8 hours until January 22, 2019  Mixed ischemic/nonischemic cardiomyopathy ICD previously implanted for primary prevention  -Case discussed with Dr. Caryl Comes, no plan for  LifeVest at this time May not be a good candidate for placement of second ICD given recent complications Would increase doses of medications back to outpatient list:  Coreg 12.5 twice daily, losartan 50 daily, torsemide 40 twice daily alternating with 40 daily  Anemia Likely iron deficiency Similar presentation to 2016 at which  time she required iron infusion  iron infusion  Discussed with her that anemia can contribute to fatigue, shortness of breath, swelling Symptoms should improve as anemia resolves  CAD Previous cardiac catheterization 10/05/2018 with chronic occlusion of LAD and intervention/PCI not recommended at that time.  -Continue aspirin  Plavix once hemoglobin has stabilized Hematocrit today 26.9   Total encounter time more than 25 minutes  Greater than 50% was spent in counseling and coordination of care with the patient   For questions or updates, please contact [facility] Please consult [email or web address] for contact info under        Signed, Ida Rogue, MD  12/16/2018, 10:52 AM

## 2018-12-16 NOTE — Plan of Care (Signed)
  Problem: Education: Goal: Knowledge of General Education information will improve Description Including pain rating scale, medication(s)/side effects and non-pharmacologic comfort measures Outcome: Progressing   Problem: Health Behavior/Discharge Planning: Goal: Ability to manage health-related needs will improve Outcome: Progressing   Problem: Clinical Measurements: Goal: Ability to maintain clinical measurements within normal limits will improve Outcome: Progressing Goal: Will remain free from infection Outcome: Progressing Note:  Remains afebrile  Goal: Diagnostic test results will improve Outcome: Progressing Note:  HGB 8.3/26.9 received Fe infusion  Goal: Respiratory complications will improve Outcome: Progressing Goal: Cardiovascular complication will be avoided Outcome: Progressing   Problem: Activity: Goal: Risk for activity intolerance will decrease Outcome: Progressing

## 2018-12-16 NOTE — Progress Notes (Signed)
Pt discharged to home via wc.  Instructions  given to pt.  Questions answered.  No distress.  

## 2018-12-17 ENCOUNTER — Telehealth: Payer: Self-pay | Admitting: Family Medicine

## 2018-12-17 DIAGNOSIS — E1142 Type 2 diabetes mellitus with diabetic polyneuropathy: Secondary | ICD-10-CM | POA: Diagnosis not present

## 2018-12-17 DIAGNOSIS — R7881 Bacteremia: Secondary | ICD-10-CM | POA: Diagnosis not present

## 2018-12-17 DIAGNOSIS — E559 Vitamin D deficiency, unspecified: Secondary | ICD-10-CM | POA: Diagnosis not present

## 2018-12-17 DIAGNOSIS — I130 Hypertensive heart and chronic kidney disease with heart failure and stage 1 through stage 4 chronic kidney disease, or unspecified chronic kidney disease: Secondary | ICD-10-CM | POA: Diagnosis not present

## 2018-12-17 DIAGNOSIS — G4733 Obstructive sleep apnea (adult) (pediatric): Secondary | ICD-10-CM | POA: Diagnosis not present

## 2018-12-17 DIAGNOSIS — I2690 Septic pulmonary embolism without acute cor pulmonale: Secondary | ICD-10-CM | POA: Diagnosis not present

## 2018-12-17 DIAGNOSIS — N189 Chronic kidney disease, unspecified: Secondary | ICD-10-CM | POA: Diagnosis not present

## 2018-12-17 DIAGNOSIS — Z5181 Encounter for therapeutic drug level monitoring: Secondary | ICD-10-CM | POA: Diagnosis not present

## 2018-12-17 DIAGNOSIS — B9561 Methicillin susceptible Staphylococcus aureus infection as the cause of diseases classified elsewhere: Secondary | ICD-10-CM | POA: Diagnosis not present

## 2018-12-17 DIAGNOSIS — T827XXA Infection and inflammatory reaction due to other cardiac and vascular devices, implants and grafts, initial encounter: Secondary | ICD-10-CM | POA: Diagnosis not present

## 2018-12-17 DIAGNOSIS — I447 Left bundle-branch block, unspecified: Secondary | ICD-10-CM | POA: Diagnosis not present

## 2018-12-17 DIAGNOSIS — I5022 Chronic systolic (congestive) heart failure: Secondary | ICD-10-CM | POA: Diagnosis not present

## 2018-12-17 DIAGNOSIS — Z955 Presence of coronary angioplasty implant and graft: Secondary | ICD-10-CM | POA: Diagnosis not present

## 2018-12-17 DIAGNOSIS — I330 Acute and subacute infective endocarditis: Secondary | ICD-10-CM | POA: Diagnosis not present

## 2018-12-17 DIAGNOSIS — Z95820 Peripheral vascular angioplasty status with implants and grafts: Secondary | ICD-10-CM | POA: Diagnosis not present

## 2018-12-17 DIAGNOSIS — Z452 Encounter for adjustment and management of vascular access device: Secondary | ICD-10-CM | POA: Diagnosis not present

## 2018-12-17 DIAGNOSIS — F329 Major depressive disorder, single episode, unspecified: Secondary | ICD-10-CM | POA: Diagnosis not present

## 2018-12-17 DIAGNOSIS — I255 Ischemic cardiomyopathy: Secondary | ICD-10-CM | POA: Diagnosis not present

## 2018-12-17 DIAGNOSIS — I252 Old myocardial infarction: Secondary | ICD-10-CM | POA: Diagnosis not present

## 2018-12-17 DIAGNOSIS — R238 Other skin changes: Secondary | ICD-10-CM | POA: Diagnosis not present

## 2018-12-17 DIAGNOSIS — E1122 Type 2 diabetes mellitus with diabetic chronic kidney disease: Secondary | ICD-10-CM | POA: Diagnosis not present

## 2018-12-17 DIAGNOSIS — I2510 Atherosclerotic heart disease of native coronary artery without angina pectoris: Secondary | ICD-10-CM | POA: Diagnosis not present

## 2018-12-17 DIAGNOSIS — Z8631 Personal history of diabetic foot ulcer: Secondary | ICD-10-CM | POA: Diagnosis not present

## 2018-12-17 DIAGNOSIS — D509 Iron deficiency anemia, unspecified: Secondary | ICD-10-CM | POA: Diagnosis not present

## 2018-12-17 NOTE — Telephone Encounter (Signed)
Verbal orders given for Skilled Nursing to [facility], she verbalized understanding.

## 2018-12-17 NOTE — Telephone Encounter (Signed)
Copied from [place] [phone]. Topic: Quick Communication - Home Health Verbal Orders >> Dec 17, 2018  1:24 PM Blase Mess A wrote: Caller/Agency: [facility] Number: [phone] to leave verbal on VM Requesting OT/PT/Skilled Nursing/Social Work/Speech Therapy: Skilled Nurse Frequency: 1x a week for 5 weeks, 2x a work for 1 week

## 2018-12-18 ENCOUNTER — Telehealth: Payer: Self-pay | Admitting: Family Medicine

## 2018-12-18 NOTE — Telephone Encounter (Signed)
Copied from [place]. Topic: Quick Communication - Home Health Verbal Orders >> Dec 18, 2018 11:40 AM Berneta Levins wrote: Caller/AgencyRaquel Sarna from [place] Number: [phone], OK to leave a message Requesting OT/PT/Skilled Nursing/Social Work/Speech Therapy: PT Frequency: 1x a week for 1 week, 2x a week for 2 weeks, 1x a week for 1 week.

## 2018-12-20 LAB — CULTURE, BLOOD (ROUTINE X 2)
Culture: NO GROWTH
Culture: NO GROWTH
Special Requests: ADEQUATE
Special Requests: ADEQUATE

## 2018-12-21 NOTE — Telephone Encounter (Signed)
Called and gave verbal orders for PT to [facility] pt [name].

## 2018-12-22 ENCOUNTER — Other Ambulatory Visit: Payer: PPO | Admitting: Nurse Practitioner

## 2018-12-22 ENCOUNTER — Encounter: Payer: Self-pay | Admitting: Nurse Practitioner

## 2018-12-22 ENCOUNTER — Other Ambulatory Visit: Payer: Self-pay

## 2018-12-22 DIAGNOSIS — R531 Weakness: Secondary | ICD-10-CM | POA: Insufficient documentation

## 2018-12-22 DIAGNOSIS — I38 Endocarditis, valve unspecified: Secondary | ICD-10-CM | POA: Diagnosis not present

## 2018-12-22 DIAGNOSIS — Z515 Encounter for palliative care: Secondary | ICD-10-CM | POA: Insufficient documentation

## 2018-12-22 DIAGNOSIS — R0602 Shortness of breath: Secondary | ICD-10-CM | POA: Insufficient documentation

## 2018-12-22 NOTE — Progress Notes (Signed)
Therapist, nutritional Palliative Care Consult Note Telephone: [phone]  Fax: [phone]  PATIENT NAME: [name] DOB: [birth year] MRN: [id number]  PRIMARY CARE PROVIDER:   Myles Lipps, MD  REFERRING PROVIDER:  Myles Lipps, MD [address] [place], [place] [address]   RESPONSIBLE PARTY:   Self  RECOMMENDATIONS and PLAN:  1. Palliative care encounter Z51.5; Palliative medicine team will continue to support patient, patient's family, and medical team. Visit consisted of counseling and education dealing with the complex and emotionally intense issues of symptom management and palliative care in the setting of serious and potentially life-threatening illness  2. Dyspneic R06.00 secondary to remain stable at present time. Continue daily weights   3. Generalized weakness R53.1  secondary to CHF/CM continue with therapy as able. Encourage energy conservation and rest times.  ASSESSMENT:     I visited and observe [name] while her daughter, husband and grandchildren are present. We talked about purpose for palliative care visit and consent obtained and signed. We talked about past medical history in the setting of chronic disease. We talked about recent multiple hospitalizations. We talked about the pacemaker being removed and [name] endorses that there is not a plan to have it replaced at this time. She does continue to have outpatient appointments with Specialists including infectious disease, Cardiology. We talked about her foot which has been healing and she does continue to remain fever free. We talked about her PICC line and that her home health nurse from [facility] comes weekly to assist her. We talked about physical therapy in the work she is been doing with her therapist. [name] endorses that therapy came this morning and they were walking, doing work at [employer] sink and next visit they will go outside the home. We talked  about medical goals of care including aggressive versus conservative versus comfort care. We talked about code status as she was a DNR during hospitalization and with last hospitalization she was changed to a full code. [name] endorses that that is what her husband's wishes are. He was present and did contribute to the discussion verbalizing that he wants everything done to keep her alive and at this time she was in agreement. We reviewed the most form and hard choice book left. We talked about role of palliative care and plan of care. We talked about next visit in 4 weeks for further discussion most form, her choice We reviewed the most form and hard Choice book left. We talked about role of palliative care and plan of care. We talked about next visit in 4 weeks for further discussion of most form and revisit DNR. She will remain a full code at present time with wishes for aggressive medical interventions. Vickie. [name] did verbalize that she would not want to be on a ventilator for an extended. If there is a time there was no hope. We talked about what hope meant. Therapeutic listening and emotional support provided. Questions answer satisfaction. Assessment completed. Contact information provided and schedule follow-up appointment for 4 weeks  3 / 11 / 2020 sodium 139, potassium 4.0, chloride 100, Co2 29, calcium 8.7, bun 22, creatinine 1.04, glucose 136, WBC 4.7, hemoglobin 8.3, hematocrit 26.9, platelets 231  Recent TEE shows EF 25 to 30% at [facility]  I spent 90 minutes providing this consultation,  From 1:30pm  to 3:00pm. More than 50% of the time in this consultation was spent coordinating communication.   HISTORY  OF PRESENT ILLNESS:  [name] is a 63 y.o. year old female with multiple medical problems including Late onset CVA, Systolic congestive heart failure, coronary artery disease, automatic implantable cardioverter-defibrillator placed, myocardial infarction, left bundle branch block,  his shaved endocarditis, obstructive sleep apnea, diabetes, hyperlipidemia, lymphedema, hypertension, obesity, neuropathy, chronic kidney disease, iron deficiency anemia, depression, history of osteomyelitis ankle/foot 2013, cataracts, neuropathy, vaginal hysterectomy with oophorectomy, tubal ligation, TEE without cardioversion, right, left toe amputation, lumbar disc surgery, cholecystectomy. Hospitalize 12 / 23 / 2019 for mssa bacteremia, left foot secondary to ulceration which required IV antibiotics for 6 weeks with repeating blood cultures 12/24 / 2019 negative. TEE 12 / 27 / 2019 negative for vegetation with ICD wires do not look infected s/p left second MTP with Podiatry s/p left lower extremity angiogram with stent placement, angioplasty by vascular surgery. Acute non stemi and the setting of chronic LED occlusive disease and stand in 2013. Medical management with Cardiology. Cardiac catheterization 12/13 / 2019 showed chronically occluded LED which is narrow vessel and further address by Cardiology outpatient. Tachycardia s/p pacemaker placement continued on amiodarone. Hospitalized 1/6 / 2020 to 10/16/2018 for right upper lobe pneumonia on maxipime and change to Levaquin upon discharge. Acute kidney injury on chronic kidney disease stage 3 receiving IV fluids and creatinine improved from 2.52 down to 1.52, obstructive sleep apnea with nocturnal hypoxia requiring CPAP. Anemia of chronic disease. Diabetes stopped Amaryl continue insulin glargine. 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 She live with her husband. For documentation to remain hopeful for improvement. Confirms DNR / do not intubate and expressed her awareness of condition that the impact that CPR and life support gives. Hospice outpatient services were  offer though at this time she was  receiving IV antibiotics and wish for palliative to follow at home. Hospitalize 1 / 24 / 2020 to 1 / 29 / 2020 for acute on chronic congestive heart failure requiring IV Lasix and continued with PICC line for antibiotic therapy for osteomyelitis of foot. What's recommended that she be discharged to short-term rehab she wish to go home. Hospitalize 2 / 16 / 2020 for hypoxia, encephalopathy with confusion. She was admitted to intensive care and treated for severe sepsis with volume resuscitation and broad-spectrum antibiotics, requiring transfusion 2 units for hemoglobin at 7. She was supported by norepinephrine to maintain map goal greater than 65. Elevated troponin heart Cardiology demand ischemia. Blood cultures positive for methicillin sensitive staph aureus and seen by infectious disease antibiotics change to nafcillin. It is recommended that the aicd be removed given recurrent relapsed bacteremia. She was transferred  [facility] for procedure that cannot be done at [facility]. She was consulted by Nephrology did not require dialysis at time of transfer but creatinine peaked at 3.93 on 2/7 / 2019 but improved to 3.6. Hospitalize 3 / 9 / 2020 to3 / 11 / 2020 recurrent mssa bacteremia and tricuspid endocarditis initially source of infection was left foot toe which was amputated. Repeat blood cultures 3 / 10 / 2020 negative. Ischemic cardiomyopathy since ICD remove no need for [facility] at discharge / Cardiology. Recent TEE shows EF 25 to 30% at [facility]. Diabetes table on Lantus and aspart Insulin. Coronary artery disease stable on Plavix and aspirin. She was discharged home with Home Health. Palliative care was asked to see Miss carringer for Hospital follow-up  visit for further discussion of goals of care, code status, chronic disease management. Since hospitalization she has been home she is been followed by Home Health nursing for ongoing antibiotic therapy through a PICC line, Physical Therapy. She is able to walk without a  walker though outside the home she uses a cane. She does require some assistance with ADLs though more independent. She feeds herself an appetite has improved. She denies fever, cough, pain, or shortness of breath. She's able to verbalize your needs. At present she is sitting in the living room in her home with her daughter, two grandchildren and husband. She appears comfortable and verbalizes she's feeling great. Palliative Care was asked to help address goals of care.   CODE STATUS: full code  PPS: 50% HOSPICE ELIGIBILITY/DIAGNOSIS: TBD  PAST MEDICAL HISTORY:  Past Medical History:  Diagnosis Date   Acute myocardial infarction, subendocardial infarction, initial episode of care (HCC) 07/21/2012   Acute osteomyelitis involving ankle and foot (HCC) 02/06/2015   Acute systolic heart failure (HCC) 07/21/2012   New onset 07/19/12; admission to [facility] ED. Elevated Troponins.  S/p 2D-echo with EF 20-25%.  S/p cardiac catheterization with stenting LAD.  Repeat 2D-echo 10/2011 with improved EF of 35%.    Anemia    Automatic implantable cardioverter-defibrillator in situ    a. MDT CRT-D 06/2014, SN: [id number] H     CAD (coronary artery disease)    a. cardiac cath 101/04/2012: PCI/DES to chronically occluded mLAD, consideration PCI to diag branch in 4 weeks.    Cataract    Chicken pox    Chronic kidney disease    Chronic systolic CHF (congestive heart failure) (HCC)    a. mixed ICM & NICM; b. EF 20-25% by echo 07/2012, mid-dist 2/3 of LV sev HK/AK, mild MR. echo 10/2012: EF 30-35%, sev HK ant-septal & inf walls, GR1DD, mild MR, PASP 33. c. echo 02/2013: EF 30%, GR1DD, mild MR. echo 04/2014: EF 30%, Septal-lat dyssynchrony, global HK, inf AK, GR1DD, mild MR. d. echo 10/2014: EF50-55%, WM nl, GR1DD, septal mild paradox. e. echo 02/2015: EF 50-55%, wm    Depression    Heart attack (HCC)    Heel ulcer (HCC) 04/27/2015   History of blood transfusion ~ 2011   "plasma; had neuropathy; couldn't  walk"   Hypertension    LBBB (left bundle branch block)    Neuromuscular disorder (HCC)    Neuropathy 2011   Obesity, unspecified    OSA on CPAP    Moderate with AHI 23/hr and now on CPAP at 16cm H2O.  Her DME is [facility]   Pure hypercholesterolemia    Type II diabetes mellitus (HCC)    Unspecified vitamin D deficiency     SOCIAL HX:  Social History   Tobacco Use   Smoking status: Never Smoker   Smokeless tobacco: Never Used  Substance Use Topics   Alcohol use: No    Alcohol/week: 0.0 standard drinks    ALLERGIES: No Known Allergies   PERTINENT MEDICATIONS:  Outpatient Encounter Medications as of 12/22/2018  Medication Sig   albuterol (PROVENTIL) (2.5 MG/3ML) 0.083% nebulizer solution Take 3 mLs (2.5 mg total) by nebulization every 4 (four) hours as needed for wheezing or shortness of breath.   aspirin EC 81 MG EC tablet Take 1 tablet (81 mg total) by mouth daily.   carvedilol (COREG) 6.25 MG tablet Take 1 tablet (6.25 mg total) by mouth 2 (two) times daily with a meal.   ceFAZolin (ANCEF) IVPB Inject 2 g  into the vein every 8 (eight) hours. Indication:  MSSA Bacteremia, Tricuspid valve endocarditis/Mitral Valve Vegetation  Last Day of Therapy:  01/22/2019 Labs - Once weekly:  CBC/D and CMP, Labs - Every other week:  ESR and CRP Please leave PICC in place until doctor has seen patient or been notified.   clopidogrel (PLAVIX) 75 MG tablet Take 1 tablet (75 mg total) by mouth daily.   insulin aspart (NOVOLOG) 100 UNIT/ML injection Inject 0-15 Units into the skin 3 (three) times daily with meals.   insulin aspart (NOVOLOG) 100 UNIT/ML injection Inject 0-5 Units into the skin at bedtime.   insulin glargine (LANTUS) 100 UNIT/ML injection Inject 0.16 mLs (16 Units total) into the skin at bedtime.   losartan (COZAAR) 50 MG tablet Take 1 tablet (50 mg total) by mouth daily.   ondansetron (ZOFRAN) 4 MG/2ML SOLN injection Inject 2 mLs (4 mg total) into the vein every 6  (six) hours as needed for nausea.   torsemide (DEMADEX) 20 MG tablet Take 1 tablet (20 mg total) by mouth daily.   No facility-administered encounter medications on file as of 12/22/2018.     PHYSICAL EXAM:   General: NAD, pleasant female Cardiovascular: regular rate and rhythm Pulmonary: clear ant fields Abdomen: soft, nontender, + bowel sounds GU: no suprapubic tenderness Extremities: +BLE edema, no joint deformities Skin: no rashes Neurological: Weakness but otherwise nonfocal  Aadya Kindler [place], NP

## 2018-12-22 NOTE — Progress Notes (Signed)
[name] ID: [name], female    DOB: 29-Dec-1955, 63 y.o.   MRN: [id number]  HPI  [name] is a 63 y/o female with a history of CAD, DM, hyperlipidemia, HTN, CKD, anemia, vitamin D deficiency, obstructive sleep apnea, osteomyelitis, depression and chronic heart failure.   TEE report from 12/11/2018 reviewed and showed an EF of 25-35%. TEE done 11/29/2018 revealing large mobile vegetation present on anterior leaflet and septal leaflet of tricuspid valve with moderate TR. Echo report from 11/23/2018 reviewed and showed an EF of 40-45%. Echo report from 10/02/18 reviewed and showed an EF of 45-50% along with mild MR and trivial AR but without vegetation.   Cardiac catheterization done 10/05/18 showed:   Medical management was recommended.   Prox Cx to Dist Cx lesion is 80% stenosed.  Prox LAD-1 lesion is 40% stenosed.  Prox LAD-2 lesion is 90% stenosed.  Prox LAD to Mid LAD lesion is 80% stenosed.  Mid LAD to Dist LAD lesion is 70% stenosed.  1st Diag lesion is 90% stenosed.  Ost 1st Diag lesion is 80% stenosed.  Mid RCA-1 lesion is 40% stenosed.  Mid RCA-2 lesion is 40% stenosed.  Admitted 12/14/2018 from [name] due to endocarditis. Cardiology & ID consults obtained. PICC line placed for antibiotics. Discharged after 2 days. Admitted 11/29/2018 [name]) due to MSSA and consideration for AICD lead removal as well as possible device removal. Cardiology consult obtained. ICD was removed. Transferred back to [name] after 15 days. Admitted 11/22/2018 due to altered mental status and NSTEMI. Given IV fluids and antibiotics due to sepsis.Cardiology and Infectious disease consults obtained. Given 2 units of blood due to anemia. Elevated troponin thought to be due to demand ischemia. Blood cultures positive for MSSA. Consideration of AICD removal. Transferred to [name] after 7 days. Admitted 10/30/2018 due to acute on chronic HF. Diuresed with IV lasix and then transitioned to oral diuretics. Cardiology  consult was obtained. Continues on antibiotics via her PICC line. Weaned off oxygen. Discharged after 5 days. Was in [name] 10/27/2018 due to leg swelling, shortness of breath and anemia. Reported 36 pound weight gain since recent [name] discharge. Given IV lasix with good urine output and she was released. Admitted 10/12/2018 due to pneumonia. Palliative care and wound consults obtained. Treated with antibiotics. Given IV fluids due to acute kidney injury.Recent MSSA bacteremia on 6 weeks of IV Ancef. Discharged after 4 days.   She presents today for a follow-up visit with a chief complaint of minimal fatigue upon moderate exertion. She says that this has been present for years but has been improving since most recent [name] discharge. She has associated pedal edema, left leg pain and difficulty sleeping along with this. She denies any dizziness, abdominal distention, palpitations, chest pain, shortness of breath, cough or weight gain. She does feel like her edema is worsening in her legs. Has [name] currently coming to [facility] home.   Past Medical History:  Diagnosis Date  . Acute myocardial infarction, subendocardial infarction, initial episode of care ([place]) 07/21/2012  . Acute osteomyelitis involving ankle and foot ([place]) 02/06/2015  . Acute systolic heart failure ([place]) 07/21/2012   New onset 07/19/12; admission to [name]. Elevated Troponins.  S/p 2D-echo with EF 20-25%.  S/p cardiac catheterization with stenting LAD.  Repeat 2D-echo 10/2011 with improved EF of 35%.   . Anemia   . Automatic implantable cardioverter-defibrillator in situ    a. MDT CRT-D 06/2014, SN: [id number]    . CAD (  coronary artery disease)    a. cardiac cath 101/04/2012: PCI/DES to chronically occluded mLAD, consideration PCI to diag branch in 4 weeks.   . Cataract   . Chicken pox   . Chronic kidney disease   . Chronic systolic CHF (congestive heart failure) (HCC)    a. mixed ICM & NICM; b. EF 20-25% by echo  07/2012, mid-dist 2/3 of LV sev HK/AK, mild MR. echo 10/2012: EF 30-35%, sev HK ant-septal & inf walls, GR1DD, mild MR, PASP 33. c. echo 02/2013: EF 30%, GR1DD, mild MR. echo 04/2014: EF 30%, Septal-lat dyssynchrony, global HK, inf AK, GR1DD, mild MR. d. echo 10/2014: EF50-55%, WM nl, GR1DD, septal mild paradox. e. echo 02/2015: EF 50-55%, wm   . Depression   . Heart attack ([place])   . Heel ulcer ([place]) 04/27/2015  . History of blood transfusion ~ 2011   "plasma; had neuropathy; couldn't walk"  . Hypertension   . LBBB (left bundle branch block)   . Neuromuscular disorder ([place])   . Neuropathy 2011  . Obesity, unspecified   . OSA on CPAP    Moderate with AHI 23/hr and now on CPAP at 16cm H2O.  Her DME is [facility]  . Pure hypercholesterolemia   . Type II diabetes mellitus ([place])   . Unspecified vitamin D deficiency    Past Surgical History:  Procedure Laterality Date  . AMPUTATION TOE Right 06/18/2015   Procedure: AMPUTATION TOE;  Surgeon: Samara Deist, DPM;  Location: [facility] ORS;  Service: Podiatry;  Laterality: Right;  . AMPUTATION TOE Left 10/08/2018   Procedure: AMPUTATION TOE LEFT 2ND;  Surgeon: Albertine Patricia, DPM;  Location: [facility] ORS;  Service: Podiatry;  Laterality: Left;  . BACK SURGERY    . BI-VENTRICULAR IMPLANTABLE CARDIOVERTER DEFIBRILLATOR N/A 07/06/2014   Procedure: BI-VENTRICULAR IMPLANTABLE CARDIOVERTER DEFIBRILLATOR  (CRT-D);  Surgeon: Deboraha Sprang, MD;  Location: [name] CATH LAB;  Service: Cardiovascular;  Laterality: N/A;  . BI-VENTRICULAR IMPLANTABLE CARDIOVERTER DEFIBRILLATOR  (CRT-D)  07/06/2014  . BILATERAL OOPHORECTOMY  01/2011   ovarian cyst benign  . COLONOSCOPY WITH PROPOFOL Left 02/22/2015   Procedure: COLONOSCOPY WITH PROPOFOL;  Surgeon: Hulen Luster, MD;  Location: [facility] ENDOSCOPY;  Service: Endoscopy;  Laterality: Left;  . CORONARY ANGIOPLASTY WITH STENT PLACEMENT Left 07/2012   new onset systolic CHF; elevated troponins.  Cardiac catheterization with stenting to LAD; EF 15%.   2D-echo: EF 20-25%.  . ESOPHAGOGASTRODUODENOSCOPY N/A 02/22/2015   Procedure: ESOPHAGOGASTRODUODENOSCOPY (EGD);  Surgeon: Hulen Luster, MD;  Location: [name] ENDOSCOPY;  Service: Endoscopy;  Laterality: N/A;  . INCISION AND DRAINAGE ABSCESS Right 2007   groin; with ICU stay due to sepsis.  Marland Kitchen LAPAROSCOPIC CHOLECYSTECTOMY  2011  . LEFT HEART CATH AND CORONARY ANGIOGRAPHY N/A 10/05/2018   Procedure: LEFT HEART CATH AND CORONARY ANGIOGRAPHY;  Surgeon: Minna Merritts, MD;  Location: [place] CV LAB;  Service: Cardiovascular;  Laterality: N/A;  . LEFT HEART CATHETERIZATION WITH CORONARY ANGIOGRAM N/A 07/21/2012   Procedure: LEFT HEART CATHETERIZATION WITH CORONARY ANGIOGRAM;  Surgeon: Jolaine Artist, MD;  Location: [facility] CATH LAB;  Service: Cardiovascular;  Laterality: N/A;  . [place]   L4-5  . PERCUTANEOUS CORONARY STENT INTERVENTION (PCI-S) N/A 07/23/2012   Procedure: PERCUTANEOUS CORONARY STENT INTERVENTION (PCI-S);  Surgeon: Sherren Mocha, MD;  Location: [facility] CATH LAB;  Service: Cardiovascular;  Laterality: N/A;  . PERIPHERAL VASCULAR BALLOON ANGIOPLASTY Left 10/06/2018   Procedure: PERIPHERAL VASCULAR BALLOON ANGIOPLASTY;  Surgeon: Katha Cabal, MD;  Location: [place]  CV LAB;  Service: Cardiovascular;  Laterality: Left;  . PERIPHERAL VASCULAR CATHETERIZATION N/A 02/10/2015   Procedure: Picc Line Insertion;  Surgeon: Katha Cabal, MD;  Location: [place] CV LAB;  Service: Cardiovascular;  Laterality: N/A;  . TEE WITHOUT CARDIOVERSION N/A 10/02/2018   Procedure: TRANSESOPHAGEAL ECHOCARDIOGRAM (TEE);  Surgeon: Wellington Hampshire, MD;  Location: [facility] ORS;  Service: Cardiovascular;  Laterality: N/A;  . [place]  . VAGINAL HYSTERECTOMY  01/2011   Fibroids/DUB.  Ovaries removed. [place].   Family History  Problem Relation Age of Onset  . Diabetes Mother   . Hypertension Mother   . Arthritis Mother        knees, lumbar DDD, cervical DDD  .  Cancer Father        prostate,skin,lymphoma.  . Cancer Brother 48       bladder cancer; non-smoker  . Diabetes Maternal Grandmother   . Heart disease Maternal Grandmother   . COPD Maternal Grandmother   . Diabetes Paternal Grandmother   . Obesity Brother   . Diabetes Son   . Hypertension Son   . Cancer Maternal Grandfather   . Diabetes Paternal Grandfather    Social History   Tobacco Use  . Smoking status: Never Smoker  . Smokeless tobacco: Never Used  Substance Use Topics  . Alcohol use: No    Alcohol/week: 0.0 standard drinks   No Known Allergies  Prior to Admission medications   Medication Sig Start Date End Date Taking? Authorizing Provider  aspirin EC 81 MG EC tablet Take 1 tablet (81 mg total) by mouth daily. 11/24/18  Yes Epifanio Lesches, MD  carvedilol (COREG) 6.25 MG tablet Take 1 tablet (6.25 mg total) by mouth 2 (two) times daily with a meal. 12/16/18  Yes Gladstone Lighter, MD  ceFAZolin (ANCEF) IVPB Inject 2 g into Vickie vein every 8 (eight) hours. Indication:  MSSA Bacteremia, Tricuspid valve endocarditis/Mitral Valve Vegetation  Last Day of Therapy:  01/22/2019 Labs - Once weekly:  CBC/D and CMP, Labs - Every other week:  ESR and CRP Please leave PICC in place until doctor has seen [name] or been notified. 12/16/18 01/23/19 Yes Gladstone Lighter, MD  clopidogrel (PLAVIX) 75 MG tablet Take 1 tablet (75 mg total) by mouth daily. 11/24/18  Yes Epifanio Lesches, MD  gabapentin (NEURONTIN) 600 MG tablet Take 600 mg by mouth 2 (two) times daily. Takes 1 in Vickie morning and two at bedtime   Yes [provider]  glimepiride (AMARYL) 4 MG tablet Take 4 mg by mouth 2 (two) times daily.   Yes [provider]  insulin glargine (LANTUS) 100 UNIT/ML injection Inject 0.16 mLs (16 Units total) into Vickie skin at bedtime. 11/24/18  Yes Epifanio Lesches, MD  losartan (COZAAR) 50 MG tablet Take 1 tablet (50 mg total) by mouth daily. 12/17/18  Yes Gladstone Lighter, MD  torsemide (DEMADEX) 20 MG tablet Take 1 tablet (20 mg total) by mouth daily. 12/17/18  Yes Gladstone Lighter, MD  traMADol (ULTRAM) 50 MG tablet Take 100 mg by mouth at bedtime.   Yes [provider]    Review of Systems  Constitutional: Positive for fatigue (improving). Negative for appetite change.  HENT: Negative for congestion, rhinorrhea and sore throat.   Eyes: Negative.   Respiratory: Negative for cough, shortness of breath and wheezing.   Cardiovascular: Positive for leg swelling. Negative for chest pain and palpitations.  Gastrointestinal: Negative for abdominal distention and abdominal pain.  Endocrine: Negative.   Genitourinary:  Negative.   Musculoskeletal: Positive for arthralgias (left leg throbs at night) and back pain. Negative for neck pain.  Skin: Negative.  Negative for wound.  Allergic/Immunologic: Negative.   Neurological: Negative for dizziness and light-headedness.  Hematological: Negative for adenopathy. Does not bruise/bleed easily.  Psychiatric/Behavioral: Positive for sleep disturbance (sleeping on 1 pillow with oxygen @ 2L). Negative for dysphoric mood. [name] is not nervous/anxious.    Vitals:   12/23/18 1017  BP: (!) 115/59  Pulse: 84  Resp: 18  SpO2: 100%  Weight: 286 lb 8 oz (130 kg)  Height: 5' 8"  (1.727 m)   Wt Readings from Last 3 Encounters:  12/23/18 286 lb 8 oz (130 kg)  12/22/18 126 lb 9.6 oz (57.4 kg)  12/16/18 279 lb 3.2 oz (126.6 kg)   Lab Results  Component Value Date   CREATININE 1.04 (H) 12/16/2018   CREATININE 1.14 (H) 12/15/2018   CREATININE 1.18 (H) 12/14/2018   CREATININE 1.14 (H) 12/14/2018    Physical Exam Vitals signs and nursing note reviewed.  Constitutional:      Appearance: She is well-developed.  HENT:     Head: Normocephalic and atraumatic.  Neck:     Musculoskeletal: Normal range of motion and neck supple.  Cardiovascular:     Rate and Rhythm: Normal rate and regular rhythm.   Pulmonary:     Effort: Pulmonary effort is normal. No respiratory distress.     Breath sounds: No rhonchi or rales.  Abdominal:     General: There is no distension.     Palpations: Abdomen is soft.  Musculoskeletal:     Right lower leg: She exhibits no tenderness. Edema (2+ pitting although softer) present.     Left lower leg: She exhibits no tenderness. Edema (3+ pitting although softer) present.  Skin:    General: Skin is warm and dry.  Neurological:     General: No focal deficit present.     Mental Status: She is alert and oriented to person, place, and time.  Psychiatric:        Mood and Affect: Mood normal.        Behavior: Behavior normal.    Assessment & Plan:  1: Chronic heart failure with reduced ejection fraction- - NYHA class III - euvolemic today - weighing daily and she was reminded to call for an overnight weight gain of >2 pounds or a weekly weight gain of >5 pounds - weight down 6 pounds from last visit here 6 weeks ago - doesn't add salt to her food (except tomatoes) but admits that she often eats salty foods. - is now taking torsemide 61m BID due to edema; depending on lab results from home health, could add an additional 240mwhile waiting on lymphapress boots - saw EP (KCaryl Comes1/21/2020  - BNP 11/22/2018 was 2246.0 - she says that she's received her flu vaccine for this season - wears oxygen @ 2L at bedtime - pacemaker has been removed due to infection - PT working with [name] at her home - palliative care visit was done yesterday - PharmD reconciled medications with [name]  2: HTN- - BP looks good although on Vickie low side - saw PCP (SPamella Pert2/12/2018 - BMP from 12/16/2018 reviewed and showed sodium 139, potassium 4.0, creatinine 1.04 and GFR 57  3: DM- - A1c 10/22/2018 was 7.8% - glucose today was 108 at home  4: Lymphedema- - stage 2 - does elevate her legs but says that her edema continues - has  tried wearing support socks but says that  they cut into her leg behind her knee causing pain - recently started PT - had labs at [place] yesterday so will request those results - referral made for lymphapress compression boots  Medication list was reviewed.  Return in 3 months or sooner for any questions/problems before then.

## 2018-12-23 ENCOUNTER — Encounter: Payer: Self-pay | Admitting: Pharmacist

## 2018-12-23 ENCOUNTER — Other Ambulatory Visit: Payer: Self-pay

## 2018-12-23 ENCOUNTER — Ambulatory Visit: Payer: PPO | Attending: Family | Admitting: Family

## 2018-12-23 ENCOUNTER — Encounter: Payer: Self-pay | Admitting: Family

## 2018-12-23 VITALS — BP 115/59 | HR 84 | Resp 18 | Ht 68.0 in | Wt 286.5 lb

## 2018-12-23 DIAGNOSIS — Z8249 Family history of ischemic heart disease and other diseases of the circulatory system: Secondary | ICD-10-CM | POA: Diagnosis not present

## 2018-12-23 DIAGNOSIS — Z955 Presence of coronary angioplasty implant and graft: Secondary | ICD-10-CM | POA: Diagnosis not present

## 2018-12-23 DIAGNOSIS — Z7901 Long term (current) use of anticoagulants: Secondary | ICD-10-CM | POA: Diagnosis not present

## 2018-12-23 DIAGNOSIS — Z79899 Other long term (current) drug therapy: Secondary | ICD-10-CM | POA: Insufficient documentation

## 2018-12-23 DIAGNOSIS — I890 Lymphedema, not elsewhere classified: Secondary | ICD-10-CM | POA: Insufficient documentation

## 2018-12-23 DIAGNOSIS — M79605 Pain in left leg: Secondary | ICD-10-CM | POA: Diagnosis not present

## 2018-12-23 DIAGNOSIS — I2510 Atherosclerotic heart disease of native coronary artery without angina pectoris: Secondary | ICD-10-CM | POA: Diagnosis not present

## 2018-12-23 DIAGNOSIS — E785 Hyperlipidemia, unspecified: Secondary | ICD-10-CM | POA: Insufficient documentation

## 2018-12-23 DIAGNOSIS — G4733 Obstructive sleep apnea (adult) (pediatric): Secondary | ICD-10-CM | POA: Diagnosis not present

## 2018-12-23 DIAGNOSIS — Z794 Long term (current) use of insulin: Secondary | ICD-10-CM | POA: Diagnosis not present

## 2018-12-23 DIAGNOSIS — I5022 Chronic systolic (congestive) heart failure: Secondary | ICD-10-CM | POA: Insufficient documentation

## 2018-12-23 DIAGNOSIS — E1142 Type 2 diabetes mellitus with diabetic polyneuropathy: Secondary | ICD-10-CM

## 2018-12-23 DIAGNOSIS — R7881 Bacteremia: Secondary | ICD-10-CM | POA: Insufficient documentation

## 2018-12-23 DIAGNOSIS — I252 Old myocardial infarction: Secondary | ICD-10-CM | POA: Insufficient documentation

## 2018-12-23 DIAGNOSIS — I130 Hypertensive heart and chronic kidney disease with heart failure and stage 1 through stage 4 chronic kidney disease, or unspecified chronic kidney disease: Secondary | ICD-10-CM | POA: Insufficient documentation

## 2018-12-23 DIAGNOSIS — I447 Left bundle-branch block, unspecified: Secondary | ICD-10-CM | POA: Insufficient documentation

## 2018-12-23 DIAGNOSIS — D649 Anemia, unspecified: Secondary | ICD-10-CM | POA: Diagnosis not present

## 2018-12-23 DIAGNOSIS — Z95810 Presence of automatic (implantable) cardiac defibrillator: Secondary | ICD-10-CM | POA: Insufficient documentation

## 2018-12-23 DIAGNOSIS — Z7982 Long term (current) use of aspirin: Secondary | ICD-10-CM | POA: Diagnosis not present

## 2018-12-23 DIAGNOSIS — N189 Chronic kidney disease, unspecified: Secondary | ICD-10-CM | POA: Diagnosis not present

## 2018-12-23 DIAGNOSIS — E7800 Pure hypercholesterolemia, unspecified: Secondary | ICD-10-CM | POA: Diagnosis not present

## 2018-12-23 DIAGNOSIS — I10 Essential (primary) hypertension: Secondary | ICD-10-CM

## 2018-12-23 DIAGNOSIS — E1122 Type 2 diabetes mellitus with diabetic chronic kidney disease: Secondary | ICD-10-CM | POA: Insufficient documentation

## 2018-12-23 DIAGNOSIS — M7989 Other specified soft tissue disorders: Secondary | ICD-10-CM | POA: Diagnosis not present

## 2018-12-23 NOTE — Patient Instructions (Signed)
Continue weighing daily and call for an overnight weight gain of > 2 pounds or a weekly weight gain of >5 pounds. 

## 2018-12-23 NOTE — Progress Notes (Signed)
[facility] - PHARMACIST COUNSELING NOTE  ADHERENCE ASSESSMENT  Adherence strategy: pill box   Do you ever forget to take your medication? _0 Yes (1) _1 No (0)  Do you ever skip doses due to side effects? _2 Yes (1) _3 No (0)  Do you have trouble affording your medicines? _4 Yes (1) _5 No (0)  Are you ever unable to pick up your medication due to transportation difficulties? _6 Yes (1) _7 No (0)  Do you ever stop taking your medications because you don't believe they are helping? _8 Yes (1) _9 No (0)  Total score _1______    Recommendations given to patient about increasing adherence: None. Reports good adherence, may miss an occasional dose of her glimepiride.  Guideline-Directed Medical Therapy/Evidence Based Medicine    ACE/ARB/ARNI: losartan 50 mg daily   Beta Blocker: carvedilol 6.25 mg twice daily   Aldosterone Antagonist: none Diuretic: torsemide 20 mg daily (taking 20 mg BID)    SUBJECTIVE   HPI: Here for follow up visit. Has had recent hospitalization for endocarditis and bacteremia with ICD removal. Currently on IV antibiotics. Denies shortness of breath, dizziness or light-headedness. Endorses significant swelling in legs.  Past Medical History:  Diagnosis Date  . Acute myocardial infarction, subendocardial infarction, initial episode of care ([place]) 07/21/2012  . Acute osteomyelitis involving ankle and foot ([place]) 02/06/2015  . Acute systolic heart failure ([place]) 07/21/2012   New onset 07/19/12; admission to [facility] ED. Elevated Troponins.  S/p 2D-echo with EF 20-25%.  S/p cardiac catheterization with stenting LAD.  Repeat 2D-echo 10/2011 with improved EF of 35%.   . Anemia   . Automatic implantable cardioverter-defibrillator in situ    a. MDT CRT-D 06/2014, SN: [id number]    . CAD (coronary artery disease)    a. cardiac cath 101/04/2012: PCI/DES to chronically occluded mLAD, consideration PCI to diag branch in 4 weeks.   .  Cataract   . Chicken pox   . Chronic kidney disease   . Chronic systolic CHF (congestive heart failure) (HCC)    a. mixed ICM & NICM; b. EF 20-25% by echo 07/2012, mid-dist 2/3 of LV sev HK/AK, mild MR. echo 10/2012: EF 30-35%, sev HK ant-septal & inf walls, GR1DD, mild MR, PASP 33. c. echo 02/2013: EF 30%, GR1DD, mild MR. echo 04/2014: EF 30%, Septal-lat dyssynchrony, global HK, inf AK, GR1DD, mild MR. d. echo 10/2014: EF50-55%, WM nl, GR1DD, septal mild paradox. e. echo 02/2015: EF 50-55%, wm   . Depression   . Heart attack ([place])   . Heel ulcer ([place]) 04/27/2015  . History of blood transfusion ~ 2011   "plasma; had neuropathy; couldn't walk"  . Hypertension   . LBBB (left bundle branch block)   . Neuromuscular disorder ([place])   . Neuropathy 2011  . Obesity, unspecified   . OSA on CPAP    Moderate with AHI 23/hr and now on CPAP at 16cm H2O.  Her DME is [facility]  . Pure hypercholesterolemia   . Type II diabetes mellitus ([place])   . Unspecified vitamin D deficiency         OBJECTIVE    Vital signs: HR 84, BP 115/59, weight (pounds) 286.8  TEE: Date 12/11/18, EF 25-35%  ECHO: Date 11/23/18, EF 40-45%    BMP Latest Ref Rng & Units 12/16/2018 12/15/2018 12/14/2018  Glucose 70 - 99 mg/dL 136(H) 163(H) -  BUN 8 - 23 mg/dL 22 27(H) -  Creatinine 0.44 - 1.00 mg/dL 1.04(H) 1.14(H) 1.14(H)  BUN/Creat Ratio 12 - 28 - - -  Sodium 135 - 145 mmol/L 139 138 -  Potassium 3.5 - 5.1 mmol/L 4.0 4.0 -  Chloride 98 - 111 mmol/L 100 100 -  CO2 22 - 32 mmol/L 29 31 -  Calcium 8.9 - 10.3 mg/dL 8.7(L) 8.4(L) -    ASSESSMENT 63 year old female with HFmrEF. She denies shortness of breath, dizziness or light-headedness. Endorses significant swelling to her legs. Her torsemide was decreased from her previous 40 mg BID to 20 mg daily while in the hospital s/t renal function. She says she has been taking 20 mg BID because of the swelling instead of the 20 mg daily prescribed at discharge.    PLAN Will do compression  boots. Could consider increasing torsemide now that renal function back to baseline or monitor for improvement in edema once patient able to get boots. EF reduced since previous ECHO. Consider further optimization of HF regimen as BP allows.    Time spent: 10 minutes  Wayne, Pharm.D. 12/23/2018 10:36 AM    Current Outpatient Medications:  .  albuterol (PROVENTIL) (2.5 MG/3ML) 0.083% nebulizer solution, Take 3 mLs (2.5 mg total) by nebulization every 4 (four) hours as needed for wheezing or shortness of breath. (Patient not taking: Reported on 12/23/2018), Disp: 75 mL, Rfl: 12 .  aspirin EC 81 MG EC tablet, Take 1 tablet (81 mg total) by mouth daily., Disp: 30 tablet, Rfl: 0 .  carvedilol (COREG) 6.25 MG tablet, Take 1 tablet (6.25 mg total) by mouth 2 (two) times daily with a meal., Disp: 60 tablet, Rfl: 2 .  ceFAZolin (ANCEF) IVPB, Inject 2 g into the vein every 8 (eight) hours. Indication:  MSSA Bacteremia, Tricuspid valve endocarditis/Mitral Valve Vegetation  Last Day of Therapy:  01/22/2019 Labs - Once weekly:  CBC/D and CMP, Labs - Every other week:  ESR and CRP Please leave PICC in place until doctor has seen patient or been notified., Disp: 114 Units, Rfl: 0 .  clopidogrel (PLAVIX) 75 MG tablet, Take 1 tablet (75 mg total) by mouth daily., Disp: 30 tablet, Rfl: 0 .  gabapentin (NEURONTIN) 600 MG tablet, Take 600 mg by mouth 2 (two) times daily. Takes 1 in the morning and two at bedtime, Disp: , Rfl:  .  glimepiride (AMARYL) 4 MG tablet, Take 4 mg by mouth 2 (two) times daily., Disp: , Rfl:  .  insulin aspart (NOVOLOG) 100 UNIT/ML injection, Inject 0-15 Units into the skin 3 (three) times daily with meals. (Patient not taking: Reported on 12/23/2018), Disp: 10 mL, Rfl: 11 .  insulin aspart (NOVOLOG) 100 UNIT/ML injection, Inject 0-5 Units into the skin at bedtime. (Patient not taking: Reported on 12/23/2018), Disp: 10 mL, Rfl: 11 .  insulin glargine (LANTUS) 100 UNIT/ML injection,  Inject 0.16 mLs (16 Units total) into the skin at bedtime., Disp: 10 mL, Rfl: 11 .  losartan (COZAAR) 50 MG tablet, Take 1 tablet (50 mg total) by mouth daily., Disp: 30 tablet, Rfl: 2 .  ondansetron (ZOFRAN) 4 MG/2ML SOLN injection, Inject 2 mLs (4 mg total) into the vein every 6 (six) hours as needed for nausea. (Patient not taking: Reported on 12/23/2018), Disp: 2 mL, Rfl: 0 .  torsemide (DEMADEX) 20 MG tablet, Take 1 tablet (20 mg total) by mouth daily., Disp: 30 tablet, Rfl: 1 .  traMADol (ULTRAM) 50 MG tablet, Take 100 mg by mouth at bedtime., Disp: , Rfl:    COUNSELING POINTS/CLINICAL PEARLS  Carvedilol (Goal: weight less than 85 kg is 25 mg BID,  weight greater than 85 kg is 50 mg BID)  Patient should avoid activities requiring coordination until drug effects are realized, as drug may cause dizziness.  This drug may cause diarrhea, nausea, vomiting, arthralgia, back pain, myalgia, headache, vision disorder, erectile dysfunction, reduced libido, or fatigue.  Instruct patient to report signs/symptoms of adverse cardiovascular effects such as hypotension (especially in elderly patients), arrhythmias, syncope, palpitations, angina, or edema.  Drug may mask symptoms of hypoglycemia. Advise diabetic patients to carefully monitor blood sugar levels.  Patient should take drug with food.  Advise patient against sudden discontinuation of drug. Losartan (Goal: 150 mg once daily)  Warn female patient to avoid pregnancy and to report a pregnancy that occurs during therapy.  Side effects may include dizziness, upper respiratory infection, nasal congestion, and back pain.  Warn patient to avoid use of potassium supplements or potassium-containing salt substitutes unless they consult healthcare provider. Torsemide  Side effects may include excessive urination.  Tell patient to report symptoms of ototoxicity.  Instruct patient to report lightheadedness or syncope.  Warn patient to avoid use of  nonprescription NSAID products without first discussing it with their healthcare provider.   DRUGS TO AVOID IN HEART FAILURE  Drug or Class Mechanism  Analgesics . NSAIDs . COX-2 inhibitors . Glucocorticoids  Sodium and water retention, increased systemic vascular resistance, decreased response to diuretics   Diabetes Medications . Metformin . Thiazolidinediones o Rosiglitazone (Avandia) o Pioglitazone (Actos) . DPP4 Inhibitors o Saxagliptin (Onglyza) o Sitagliptin (Januvia)   Lactic acidosis Possible calcium channel blockade   Unknown  Antiarrhythmics . Class I  o Flecainide o Disopyramide . Class III o Sotalol . Other o Dronedarone  Negative inotrope, proarrhythmic   Proarrhythmic, beta blockade  Negative inotrope  Antihypertensives . Alpha Blockers o Doxazosin . Calcium Channel Blockers o Diltiazem o Verapamil o Nifedipine . Central Alpha Adrenergics o Moxonidine . Peripheral Vasodilators o Minoxidil  Increases renin and aldosterone  Negative inotrope    Possible sympathetic withdrawal  Unknown  Anti-infective . Itraconazole . Amphotericin B  Negative inotrope Unknown  Hematologic . Anagrelide . Cilostazol   Possible inhibition of PD IV Inhibition of PD III causing arrhythmias  Neurologic/Psychiatric . Stimulants . Anti-Seizure Drugs o Carbamazepine o Pregabalin . Antidepressants o Tricyclics o Citalopram . Parkinsons o Bromocriptine o Pergolide o Pramipexole . Antipsychotics o Clozapine . Antimigraine o Ergotamine o Methysergide . Appetite suppressants . Bipolar o Lithium  Peripheral alpha and beta agonist activity  Negative inotrope and chronotrope Calcium channel blockade  Negative inotrope, proarrhythmic Dose-dependent QT prolongation  Excessive serotonin activity/valvular damage Excessive serotonin activity/valvular damage Unknown  IgE mediated hypersensitivy, calcium channel blockade  Excessive serotonin  activity/valvular damage Excessive serotonin activity/valvular damage Valvular damage  Direct myofibrillar degeneration, adrenergic stimulation  Antimalarials . Chloroquine . Hydroxychloroquine Intracellular inhibition of lysosomal enzymes  Urologic Agents . Alpha Blockers o Doxazosin o Prazosin o Tamsulosin o Terazosin  Increased renin and aldosterone  Adapted from Page RL, et al. "Drugs That May Cause or Exacerbate Heart Failure: A Scientific Statement from the [name]." Circulation 2016; 947:S96-G83. DOI: 10.1161/CIR.0000000000000426   MEDICATION ADHERENCES TIPS AND STRATEGIES 1. Taking medication as prescribed improves patient outcomes in heart failure (reduces hospitalizations, improves symptoms, increases survival) 2. Side effects of medications can be managed by decreasing doses, switching agents, stopping drugs, or adding additional therapy. Please let someone in the [facility] know if you have having bothersome side effects so we can modify your regimen. Do not alter your medication  regimen without talking to [place].  3. Medication reminders can help patients remember to take drugs on time. If you are missing or forgetting doses you can try linking behaviors, using pill boxes, or an electronic reminder like an alarm on your phone or an app. Some people can also get automated phone calls as medication reminders.

## 2018-12-24 ENCOUNTER — Ambulatory Visit (INDEPENDENT_AMBULATORY_CARE_PROVIDER_SITE_OTHER): Payer: PPO | Admitting: Family Medicine

## 2018-12-24 ENCOUNTER — Encounter: Payer: Self-pay | Admitting: Family Medicine

## 2018-12-24 ENCOUNTER — Other Ambulatory Visit: Payer: Self-pay

## 2018-12-24 VITALS — BP 120/60 | HR 98 | Temp 98.1°F | Ht 68.0 in | Wt 285.0 lb

## 2018-12-24 DIAGNOSIS — I330 Acute and subacute infective endocarditis: Secondary | ICD-10-CM

## 2018-12-24 DIAGNOSIS — E11649 Type 2 diabetes mellitus with hypoglycemia without coma: Secondary | ICD-10-CM

## 2018-12-24 DIAGNOSIS — D508 Other iron deficiency anemias: Secondary | ICD-10-CM | POA: Diagnosis not present

## 2018-12-24 DIAGNOSIS — I5022 Chronic systolic (congestive) heart failure: Secondary | ICD-10-CM

## 2018-12-24 DIAGNOSIS — B9561 Methicillin susceptible Staphylococcus aureus infection as the cause of diseases classified elsewhere: Secondary | ICD-10-CM

## 2018-12-24 MED ORDER — POTASSIUM CHLORIDE CRYS ER 10 MEQ PO TBCR: 10.0000 meq | EXTENDED_RELEASE_TABLET | Freq: Two times a day (BID) | ORAL | 0 refills | Status: DC

## 2018-12-24 MED ORDER — CLOPIDOGREL BISULFATE 75 MG PO TABS: 75.0000 mg | ORAL_TABLET | Freq: Every day | ORAL | 0 refills | Status: DC

## 2018-12-24 MED ORDER — GABAPENTIN 600 MG PO TABS: 600.0000 mg | ORAL_TABLET | Freq: Two times a day (BID) | ORAL | 1 refills | Status: DC

## 2018-12-24 NOTE — Progress Notes (Signed)
3/19/20204:25 PM  [name] 02-17-1956, 63 y.o., female [id number]  Chief Complaint  Patient presents with  . Follow-up    has been in the hospital several times, just septsis this ppast wk. Feeling alot better  . Medication Refill    plavix, gabapentin, wants to talk about weaning from celebrex    HPI:   Patient is a 63 y.o. female with past medical history significant for recent MSSA bacteremia 2/2 osteomyelitis, systolic CHF exacerbation, among other who presents today for hospital followup from endocarditis  Patient has had multiple hosp since our last OV Admitted from 3/9 to 12/16/2018 Dx with tricuspid endocarditis D/c ancef  Until 01/22/2019 Most recent echo EF 25-30% Palliative care involved Seen at CHF clinic yesterday Angiovac and removal of ICD leads done at [facility] (11/29/2018) NSTEMI, MSSA bacteremia, transfusion (11/22/2018) Discharge weight 276 lbs  Patient is overall doing well given everything she has been thru CHF clinic stated that she can increase torsemide 20mg  to 3xday  if needed. She does not have KCL Patient is planning on taking a third one today Also looking into compression boots Denies any SOB, CP Patient reports she also had IV iron transfusion picc line doing well, bandage changed 2 days ago by [facility] Has upcoming appt with ID, cards and renal Having recurring fasting lows 60s,  This morning 66, last night 143 Prescribed lantus 16 units but only took 10 units last night Sometimes she forgets to take dinner glyburide Has been cutting back on eating, sweets Using a cane picc line - right  Lab Results  Component Value Date   CREATININE 1.04 (H) 12/16/2018   BUN 22 12/16/2018   NA 139 12/16/2018   K 4.0 12/16/2018   CL 100 12/16/2018   CO2 29 12/16/2018    Lab Results  Component Value Date   HGBA1C 7.8 (H) 10/22/2018   HGBA1C 7.3 (A) 07/20/2018   HGBA1C 7.4 (H) 04/15/2018   Lab Results  Component Value Date   MICROALBUR 6.9  08/06/2016   LDLCALC 32 04/15/2018   CREATININE 1.04 (H) 12/16/2018    CBC Latest Ref Rng & Units 12/16/2018 12/15/2018 12/14/2018  WBC 4.0 - 10.5 K/uL 4.7 4.3 5.3  Hemoglobin 12.0 - 15.0 g/dL 8.3(L) 7.7(L) 8.1(L)  Hematocrit 36.0 - 46.0 % 26.9(L) 25.3(L) 26.5(L)  Platelets 150 - 400 K/uL 231 203 203   Lab Results  Component Value Date   CREATININE 1.04 (H) 12/16/2018   CREATININE 1.14 (H) 12/15/2018   CREATININE 1.18 (H) 12/14/2018   CREATININE 1.14 (H) 12/14/2018     Fall Risk  12/23/2018 11/11/2018 11/10/2018 11/09/2018 10/29/2018  Falls in the past year? 0 0 0 0 0  Number falls in past yr: 0 0 0 0 0  Injury with Fall? 0 0 0 1 0  Comment - - - - -  Risk for fall due to : - - - Impaired mobility -  Risk for fall due to: Comment - - - - -  Follow up - - - - -     Depression screen [facility] 2/9 10/30/2018 10/22/2018 07/20/2018  Decreased Interest 0 0 0  Down, Depressed, Hopeless 0 0 0  PHQ - 2 Score 0 0 0  Altered sleeping - - -  Tired, decreased energy - - -  Change in appetite - - -  Feeling bad or failure about yourself  - - -  Trouble concentrating - - -  Moving slowly or fidgety/restless - - -  Suicidal  thoughts - - -  PHQ-9 Score - - -  Difficult doing work/chores - - -  Some recent data might be hidden    No Known Allergies  Prior to Admission medications   Medication Sig Start Date End Date Taking? Authorizing Provider  aspirin EC 81 MG EC tablet Take 1 tablet (81 mg total) by mouth daily. 11/24/18   Katha Hamming, MD  carvedilol (COREG) 6.25 MG tablet Take 1 tablet (6.25 mg total) by mouth 2 (two) times daily with a meal. 12/16/18   Enid Baas, MD  ceFAZolin (ANCEF) IVPB Inject 2 g into the vein every 8 (eight) hours. Indication:  MSSA Bacteremia, Tricuspid valve endocarditis/Mitral Valve Vegetation  Last Day of Therapy:  01/22/2019 Labs - Once weekly:  CBC/D and CMP, Labs - Every other week:  ESR and CRP Please leave PICC in place until doctor has seen  patient or been notified. 12/16/18 01/23/19  Enid Baas, MD  clopidogrel (PLAVIX) 75 MG tablet Take 1 tablet (75 mg total) by mouth daily. 11/24/18   Katha Hamming, MD  gabapentin (NEURONTIN) 600 MG tablet Take 600 mg by mouth 2 (two) times daily. Takes 1 in the morning and two at bedtime    [provider]  glimepiride (AMARYL) 4 MG tablet Take 4 mg by mouth 2 (two) times daily.    [provider]  insulin glargine (LANTUS) 100 UNIT/ML injection Inject 0.16 mLs (16 Units total) into the skin at bedtime. 11/24/18   Katha Hamming, MD  losartan (COZAAR) 50 MG tablet Take 1 tablet (50 mg total) by mouth daily. 12/17/18   Enid Baas, MD  torsemide (DEMADEX) 20 MG tablet Take 1 tablet (20 mg total) by mouth daily. 12/17/18   Enid Baas, MD  traMADol (ULTRAM) 50 MG tablet Take 100 mg by mouth at bedtime.    [provider]    Past Medical History:  Diagnosis Date  . Acute myocardial infarction, subendocardial infarction, initial episode of care (HCC) 07/21/2012  . Acute osteomyelitis involving ankle and foot (HCC) 02/06/2015  . Acute systolic heart failure (HCC) 07/21/2012   New onset 07/19/12; admission to [facility] ED. Elevated Troponins.  S/p 2D-echo with EF 20-25%.  S/p cardiac catheterization with stenting LAD.  Repeat 2D-echo 10/2011 with improved EF of 35%.   . Anemia   . Automatic implantable cardioverter-defibrillator in situ    a. MDT CRT-D 06/2014, SN: [id number]    . CAD (coronary artery disease)    a. cardiac cath 101/04/2012: PCI/DES to chronically occluded mLAD, consideration PCI to diag branch in 4 weeks.   . Cataract   . Chicken pox   . Chronic kidney disease   . Chronic systolic CHF (congestive heart failure) (HCC)    a. mixed ICM & NICM; b. EF 20-25% by echo 07/2012, mid-dist 2/3 of LV sev HK/AK, mild MR. echo 10/2012: EF 30-35%, sev HK ant-septal & inf walls, GR1DD, mild MR, PASP 33. c. echo 02/2013: EF 30%, GR1DD, mild MR.  echo 04/2014: EF 30%, Septal-lat dyssynchrony, global HK, inf AK, GR1DD, mild MR. d. echo 10/2014: EF50-55%, WM nl, GR1DD, septal mild paradox. e. echo 02/2015: EF 50-55%, wm   . Depression   . Heart attack (HCC)   . Heel ulcer (HCC) 04/27/2015  . History of blood transfusion ~ 2011   "plasma; had neuropathy; couldn't walk"  . Hypertension   . LBBB (left bundle branch block)   . Neuromuscular disorder (HCC)   . Neuropathy 2011  . Obesity, unspecified   .  OSA on CPAP    Moderate with AHI 23/hr and now on CPAP at 16cm H2O.  Her DME is [facility]  . Pure hypercholesterolemia   . Type II diabetes mellitus (HCC)   . Unspecified vitamin D deficiency     Past Surgical History:  Procedure Laterality Date  . AMPUTATION TOE Right 06/18/2015   Procedure: AMPUTATION TOE;  Surgeon: Gwyneth Revels, DPM;  Location: [facility] ORS;  Service: Podiatry;  Laterality: Right;  . AMPUTATION TOE Left 10/08/2018   Procedure: AMPUTATION TOE LEFT 2ND;  Surgeon: Recardo Evangelist, DPM;  Location: [facility] ORS;  Service: Podiatry;  Laterality: Left;  . BACK SURGERY    . BI-VENTRICULAR IMPLANTABLE CARDIOVERTER DEFIBRILLATOR N/A 07/06/2014   Procedure: BI-VENTRICULAR IMPLANTABLE CARDIOVERTER DEFIBRILLATOR  (CRT-D);  Surgeon: Duke Salvia, MD;  Location: [facility] CATH LAB;  Service: Cardiovascular;  Laterality: N/A;  . BI-VENTRICULAR IMPLANTABLE CARDIOVERTER DEFIBRILLATOR  (CRT-D)  07/06/2014  . BILATERAL OOPHORECTOMY  01/2011   ovarian cyst benign  . COLONOSCOPY WITH PROPOFOL Left 02/22/2015   Procedure: COLONOSCOPY WITH PROPOFOL;  Surgeon: Wallace Cullens, MD;  Location: [facility] ENDOSCOPY;  Service: Endoscopy;  Laterality: Left;  . CORONARY ANGIOPLASTY WITH STENT PLACEMENT Left 07/2012   new onset systolic CHF; elevated troponins.  Cardiac catheterization with stenting to LAD; EF 15%.  2D-echo: EF 20-25%.  . ESOPHAGOGASTRODUODENOSCOPY N/A 02/22/2015   Procedure: ESOPHAGOGASTRODUODENOSCOPY (EGD);  Surgeon: Wallace Cullens, MD;  Location: [facility] ENDOSCOPY;   Service: Endoscopy;  Laterality: N/A;  . INCISION AND DRAINAGE ABSCESS Right 2007   groin; with ICU stay due to sepsis.  Marland Kitchen LAPAROSCOPIC CHOLECYSTECTOMY  2011  . LEFT HEART CATH AND CORONARY ANGIOGRAPHY N/A 10/05/2018   Procedure: LEFT HEART CATH AND CORONARY ANGIOGRAPHY;  Surgeon: Antonieta Iba, MD;  Location: [facility] INVASIVE CV LAB;  Service: Cardiovascular;  Laterality: N/A;  . LEFT HEART CATHETERIZATION WITH CORONARY ANGIOGRAM N/A 07/21/2012   Procedure: LEFT HEART CATHETERIZATION WITH CORONARY ANGIOGRAM;  Surgeon: Dolores Patty, MD;  Location: [facility] CATH LAB;  Service: Cardiovascular;  Laterality: N/A;  . LUMBAR DISC SURGERY  1996   L4-5  . PERCUTANEOUS CORONARY STENT INTERVENTION (PCI-S) N/A 07/23/2012   Procedure: PERCUTANEOUS CORONARY STENT INTERVENTION (PCI-S);  Surgeon: Tonny Bollman, MD;  Location: [facility] CATH LAB;  Service: Cardiovascular;  Laterality: N/A;  . PERIPHERAL VASCULAR BALLOON ANGIOPLASTY Left 10/06/2018   Procedure: PERIPHERAL VASCULAR BALLOON ANGIOPLASTY;  Surgeon: Renford Dills, MD;  Location: [facility] INVASIVE CV LAB;  Service: Cardiovascular;  Laterality: Left;  . PERIPHERAL VASCULAR CATHETERIZATION N/A 02/10/2015   Procedure: Picc Line Insertion;  Surgeon: Renford Dills, MD;  Location: [facility] INVASIVE CV LAB;  Service: Cardiovascular;  Laterality: N/A;  . TEE WITHOUT CARDIOVERSION N/A 10/02/2018   Procedure: TRANSESOPHAGEAL ECHOCARDIOGRAM (TEE);  Surgeon: Iran Ouch, MD;  Location: [facility] ORS;  Service: Cardiovascular;  Laterality: N/A;  . TUBAL LIGATION  1981  . VAGINAL HYSTERECTOMY  01/2011   Fibroids/DUB.  Ovaries removed. [place].    Social History   Tobacco Use  . Smoking status: Never Smoker  . Smokeless tobacco: Never Used  Substance Use Topics  . Alcohol use: No    Alcohol/week: 0.0 standard drinks    Family History  Problem Relation Age of Onset  . Diabetes Mother   . Hypertension Mother   . Arthritis Mother        knees, lumbar DDD,  cervical DDD  . Cancer Father        prostate,skin,lymphoma.  . Cancer Brother 48  bladder cancer; non-smoker  . Diabetes Maternal Grandmother   . Heart disease Maternal Grandmother   . COPD Maternal Grandmother   . Diabetes Paternal Grandmother   . Obesity Brother   . Diabetes Son   . Hypertension Son   . Cancer Maternal Grandfather   . Diabetes Paternal Grandfather     Review of Systems  Constitutional: Negative for chills and fever.  Respiratory: Negative for cough and shortness of breath.   Cardiovascular: Positive for leg swelling. Negative for chest pain and palpitations.  Gastrointestinal: Negative for abdominal pain, nausea and vomiting.    OBJECTIVE:  Today's Vitals   12/24/18 1651  BP: 120/60  Pulse: 98  Temp: 98.1 F (36.7 C)  TempSrc: Oral  SpO2: 98%  Weight: 285 lb (129.3 kg)  Height: 5\' 8"  (1.727 m)   Body mass index is 43.33 kg/m.  Wt Readings from Last 3 Encounters:  12/24/18 285 lb (129.3 kg)  12/23/18 286 lb 8 oz (130 kg)  12/22/18 126 lb 9.6 oz (57.4 kg)    Physical Exam Vitals signs and nursing note reviewed.  Constitutional:      Appearance: She is well-developed.  HENT:     Head: Normocephalic and atraumatic.     Mouth/Throat:     Pharynx: No oropharyngeal exudate.  Eyes:     General: No scleral icterus.    Conjunctiva/sclera: Conjunctivae normal.     Pupils: Pupils are equal, round, and reactive to light.  Neck:     Musculoskeletal: Neck supple.  Cardiovascular:     Rate and Rhythm: Normal rate and regular rhythm.     Heart sounds: Normal heart sounds. No murmur. No friction rub. No gallop.   Pulmonary:     Effort: Pulmonary effort is normal.     Breath sounds: Normal breath sounds. No wheezing, rhonchi or rales.  Musculoskeletal:     Right lower leg: Edema (+2 pitting edema BLE) present.     Left lower leg: Edema present.  Skin:    General: Skin is warm and dry.  Neurological:     Mental Status: She is alert and  oriented to person, place, and time.  Psychiatric:        Mood and Affect: Mood normal.   RUE: picc line site, dressing c/d/i, no erythema   ASSESSMENT and PLAN  1. Other iron deficiency anemia S/p transfusion and IV iron. Checking labs again. - CBC  2. Hypoglycemia associated with type 2 diabetes mellitus (HCC) Discussed down titration of lantus to fasting goal 90-130, with possible d/c of lantus.  - Comprehensive metabolic panel - Fructosamine  3. Chronic systolic congestive heart failure (HCC) Seen yesterday at CHF clinic. Instructed to increase torsemide. Doing daily weights. Given increase in torsemide, providing rx for KCL while taking higher dose. Keep upcoming appt with CHF clinic. Checking labs today, recheck in 1 week.  4. 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 HH involved.  Other orders - potassium chloride (K-DUR,KLOR-CON) 10 MEQ tablet; Take 1 tablet (10 mEq total) by mouth 2 (two) times daily. - clopidogrel (PLAVIX) 75 MG tablet; Take 1 tablet (75 mg total) by mouth daily. - gabapentin (NEURONTIN) 600 MG tablet; Take 1 tablet (600 mg total) by mouth 2 (two) times daily. Takes 1 in the morning and two at bedtime  Return in about 6 weeks (around 02/04/2019).  Asked patient to check in via mychart given current concerns for covid 19.  Myles Lipps, MD Primary Care at [facility] [address] [place], [place] [address] Ph.  [phone] Fax [phone]

## 2018-12-24 NOTE — Patient Instructions (Signed)
° ° ° °  If you have lab work done today you will be contacted with your lab results within the next 2 weeks.  If you have not heard from us then please contact us. The fastest way to get your results is to register for My Chart. ° ° °IF you received an x-ray today, you will receive an invoice from [facility]. Please contact [facility] at [phone] with questions or concerns regarding your invoice.  ° °IF you received labwork today, you will receive an invoice from [facility]. Please contact [facility] at [phone] with questions or concerns regarding your invoice.  ° °Our billing staff will not be able to assist you with questions regarding bills from these companies. ° °You will be contacted with the lab results as soon as they are available. The fastest way to get your results is to activate your My Chart account. Instructions are located on the last page of this paperwork. If you have not heard from us regarding the results in 2 weeks, please contact this office. °  ° ° ° °

## 2018-12-25 LAB — FRUCTOSAMINE: Fructosamine: 305 umol/L — ABNORMAL HIGH (ref 0–285)

## 2018-12-25 LAB — COMPREHENSIVE METABOLIC PANEL
ALT: <5 IU/L (ref 0–32)
AST: 18 IU/L (ref 0–40)
Albumin/Globulin Ratio: 1 — ABNORMAL LOW (ref 1.2–2.2)
Albumin: 3.6 g/dL — ABNORMAL LOW (ref 3.8–4.8)
Alkaline Phosphatase: 74 IU/L (ref 39–117)
BUN/Creatinine Ratio: 16 (ref 12–28)
BUN: 24 mg/dL (ref 8–27)
Bilirubin Total: <0.2 mg/dL (ref 0.0–1.2)
CO2: 23 mmol/L (ref 20–29)
Calcium: 9 mg/dL (ref 8.7–10.3)
Chloride: 99 mmol/L (ref 96–106)
Creatinine, Ser: 1.48 mg/dL — ABNORMAL HIGH (ref 0.57–1.00)
GFR calc Af Amer: 43 mL/min/{1.73_m2} — ABNORMAL LOW (ref >59)
GFR calc non Af Amer: 37 mL/min/{1.73_m2} — ABNORMAL LOW (ref >59)
Globulin, Total: 3.5 g/dL (ref 1.5–4.5)
Glucose: 132 mg/dL — ABNORMAL HIGH (ref 65–99)
Potassium: 4.9 mmol/L (ref 3.5–5.2)
Sodium: 141 mmol/L (ref 134–144)
Total Protein: 7.1 g/dL (ref 6.0–8.5)

## 2018-12-25 LAB — CBC
Hematocrit: 27.7 % — ABNORMAL LOW (ref 34.0–46.6)
Hemoglobin: 8.8 g/dL — ABNORMAL LOW (ref 11.1–15.9)
MCH: 28.5 pg (ref 26.6–33.0)
MCHC: 31.8 g/dL (ref 31.5–35.7)
MCV: 90 fL (ref 79–97)
Platelets: 265 10*3/uL (ref 150–450)
RBC: 3.09 x10E6/uL — ABNORMAL LOW (ref 3.77–5.28)
RDW: 14.8 % (ref 11.7–15.4)
WBC: 4.1 10*3/uL (ref 3.4–10.8)

## 2018-12-28 DIAGNOSIS — T827XXA Infection and inflammatory reaction due to other cardiac and vascular devices, implants and grafts, initial encounter: Secondary | ICD-10-CM | POA: Diagnosis not present

## 2018-12-28 DIAGNOSIS — I330 Acute and subacute infective endocarditis: Secondary | ICD-10-CM | POA: Diagnosis not present

## 2018-12-30 ENCOUNTER — Telehealth: Payer: Self-pay | Admitting: Cardiovascular Disease

## 2018-12-30 ENCOUNTER — Encounter: Payer: Self-pay | Admitting: Family Medicine

## 2018-12-30 NOTE — Telephone Encounter (Signed)
Levada Dy with [facility] calling   Pt c/o swelling: STAT is pt has developed SOB within 24 hours  1) How much weight have you gained and in what time span? 2 pounds overnight   2) If swelling, where is the swelling located? Lower extremities swollen - not necessarily more than usual   3) Are you currently taking a fluid pill? yes  4) Are you currently SOB? SOB with activity   5) Do you have a log of your daily weights (if so, list)?   6) Have you gained 3 pounds in a day or 5 pounds in a week?   7) Have you traveled recently?   Patient has been feeling poorly, cough and congestion started yesterday - family members are having the same congestion and runny noses - please advise on what she can take to help with this     Call patient preferably, or Levada Dy at [phone]

## 2018-12-30 NOTE — Telephone Encounter (Signed)
Spoke with patient and reviewed her weights and she reports they were 279 and today 281. Reviewed that last weight we had on file for her was 285 and to continue monitoring her daily weights. Instructed her to try and reduce her sodium intake, compression socks, leg elevation, and to please call us if weight increases above 285. She also has some congestion, chills, and other symptoms. Recommended that she call her PCP for those symptoms and again reviewed when she should give [place] a call. She was very appreciative for the call with no further questions at this time.

## 2018-12-31 ENCOUNTER — Telehealth: Payer: Self-pay | Admitting: *Deleted

## 2018-12-31 ENCOUNTER — Telehealth: Payer: Self-pay

## 2018-12-31 NOTE — Telephone Encounter (Signed)
Left message patient needs to schedule AWV

## 2018-12-31 NOTE — Telephone Encounter (Signed)
Ms, [name] contacted the office about an increase in weight.  Her weights are as follows: Monday: 279 lb Tuesday: 279 lb Wednesday: 281 lb  Thursday: 283 lb   She denies any increase in swelling and had her edema evaluated by her Physical therapist that was there today. She does mention that she doesn't think she is drinking enough as she says she drank 2 cups of coffee and a small lemonade yesterday. Her lastest blood pressure reading was 96/52. She also informed me that her PCP has asked she take 10 MeQ of potassium twice daily. She however has been only taking 5 MeQ BID because hse wasn't sure why she had been put on them.   I reviewed her latest lab work and it shows that her potassium in 4.9. I advised her that she should call the office again in the morning with an updated weight and that she would be able to speak to [facility] FNP who will review this information. I also reminded her that she should be trying to intake at least 60 oz of fluid per day which will benefit her blood pressure.

## 2019-01-01 ENCOUNTER — Telehealth: Payer: Self-pay | Admitting: Family

## 2019-01-01 ENCOUNTER — Other Ambulatory Visit: Payer: Self-pay | Admitting: Family

## 2019-01-01 MED ORDER — TORSEMIDE 20 MG PO TABS: 20.0000 mg | ORAL_TABLET | Freq: Two times a day (BID) | ORAL | 1 refills | Status: DC

## 2019-01-01 MED ORDER — POTASSIUM CHLORIDE CRYS ER 10 MEQ PO TBCR: 10.0000 meq | EXTENDED_RELEASE_TABLET | ORAL | 0 refills | Status: DC | PRN

## 2019-01-01 MED ORDER — LOSARTAN POTASSIUM 50 MG PO TABS: 25.0000 mg | ORAL_TABLET | Freq: Every day | ORAL | 2 refills | Status: DC

## 2019-01-01 NOTE — Telephone Encounter (Signed)
Patient called to say that her weight today was now 287 pounds and her BP was 107/52. She did try to drink more fluids yesterday to help with her BP. She is not adding salt to her food and is trying to eat low sodium foods.   She denies any dizziness or pedal edema but does feel like her abdomen is distended. She normally takes 20mg  torsemide BID and can take an additional 20mg  if needed. She was started on 72meq potassium BID but she's only been taking 48meq BID because she was concerned as she's had hyperkalemia in the past.   Advised patient to decrease her losartan down to 25mg  daily. Also advised to take the additional 20mg  torsemide today. Based on last few potassium results, it doesn't appear that she needs daily potassium supplementation. Advised her that on the days that she takes the third torsemide dose, she should take a whole 45meq potassium tablet, otherwise, she doesn't need to take any.   Asked her to call us back the first of next week and update [place] with BP and weights. Patient verbalized understanding.

## 2019-01-01 NOTE — Telephone Encounter (Signed)
Spoke with Levada Dy at [facility] which follows up with patients who have complex health needs. 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 Reviewed that I spoke with patient and discussed her weights and symptoms with recommendations to continue monitoring and keep [place] updated on her condition. Also reviewed that she did reach out to the heart failure clinic and her PCP as well. She requested the number to the heart failure clinic and advised that I would also send a message to them. She would like to speak with them regarding patient if possible. Will route this message to them.

## 2019-01-01 NOTE — Telephone Encounter (Signed)
LM asking Levada Dy to call me back regarding this patient

## 2019-01-04 ENCOUNTER — Telehealth: Payer: Self-pay | Admitting: Physician Assistant

## 2019-01-04 ENCOUNTER — Telehealth: Payer: Self-pay | Admitting: Family

## 2019-01-04 DIAGNOSIS — R7881 Bacteremia: Secondary | ICD-10-CM | POA: Diagnosis not present

## 2019-01-04 NOTE — Telephone Encounter (Signed)
Pt c/o Shortness Of Breath: STAT if SOB developed within the last 24 hours or pt is noticeably SOB on the phone  1. Are you currently SOB (can you hear that pt is SOB on the phone)? No, as long as she is sitting  2. How long have you been experiencing SOB?  Last week 3. Are you SOB when sitting or when up moving around? Up moving around  4. Are you currently experiencing any other symptoms? Feels like she is winded just walking from one room to the other.

## 2019-01-04 NOTE — Telephone Encounter (Signed)
Returned the pt call. Pt sts that her diuretic was recently adjusted due to weight gain on 01/01/19 Darylene Price, NP @ [facility]. She has lost 7lbs  Going from 284lb to 277lb.  She denies swelling, orthopnea, PND. She does sleep with O2 at night and cpap.  Her PPM was extracted on 11/24/18 due to bactrim infection. Pt sts that Dr. Caryl Comes adv her that she was pacing fine on her own, and may not need device reinsertion.  Pt sts that she is easily winded and wipes out easily. I asked if this has improved with the diuretic increase, pt reports that it is about the same. She denies dizziness, pre-syncope, or syncope. Pt sts that the symptoms are similar to what she had prior to needing a PPM. She denies chest pain, palpitations, she does endorse dyspnea with minimal exertion.   Adv the pt that I will fwd the update to Dr. Caryl Comes and call back with his recommendation. Pt verbalized understanding.

## 2019-01-04 NOTE — Telephone Encounter (Signed)
Patient called to update on weight. She says that she now weighs 277 pounds and has taken the third dose of torsemide a few times. Her losartan had been decreased to 25mg  daily and she has been trying to increase her fluid intake some. BP is now much better at 123/80.   She denies any edema or chest pain. She continues to have shortness of breath and feels quite tired and has a phone call into Dr. Caryl Comes regarding this.   Advised patient to continue with the low dose of losartan and reminded to take potassium supplement if she takes the third dose of torsemide. Patient verbalized understanding.

## 2019-01-05 ENCOUNTER — Telehealth: Payer: Self-pay | Admitting: Family Medicine

## 2019-01-05 NOTE — Telephone Encounter (Signed)
Copied from [place] [id number]. Topic: Quick Communication - Home Health Verbal Orders >> Jan 05, 2019 11:04 AM Margot Ables wrote: Caller/Agency: Modena Slater w/[facility] Callback Number: [phone] misc: [facility] VM received regarding increased cough, congestion, and wheezing in her lobe - requesting call back

## 2019-01-06 ENCOUNTER — Telehealth: Payer: Self-pay

## 2019-01-06 ENCOUNTER — Telehealth: Payer: Self-pay | Admitting: Family Medicine

## 2019-01-06 DIAGNOSIS — R002 Palpitations: Secondary | ICD-10-CM | POA: Diagnosis not present

## 2019-01-06 DIAGNOSIS — N183 Chronic kidney disease, stage 3 (moderate): Secondary | ICD-10-CM | POA: Diagnosis not present

## 2019-01-06 DIAGNOSIS — R809 Proteinuria, unspecified: Secondary | ICD-10-CM | POA: Diagnosis not present

## 2019-01-06 DIAGNOSIS — I890 Lymphedema, not elsewhere classified: Secondary | ICD-10-CM | POA: Diagnosis not present

## 2019-01-06 DIAGNOSIS — I129 Hypertensive chronic kidney disease with stage 1 through stage 4 chronic kidney disease, or unspecified chronic kidney disease: Secondary | ICD-10-CM | POA: Diagnosis not present

## 2019-01-06 DIAGNOSIS — E1122 Type 2 diabetes mellitus with diabetic chronic kidney disease: Secondary | ICD-10-CM | POA: Diagnosis not present

## 2019-01-06 NOTE — Telephone Encounter (Signed)
Pt has never had this rx sent in by [place] so she would need a tele med visit.

## 2019-01-06 NOTE — Telephone Encounter (Signed)
I called pt with no answer.

## 2019-01-06 NOTE — Telephone Encounter (Signed)
I am sorry to hear she is feels so lousy -- her LBBB would potentially explain her symptoms following explant Is she done with her abx Lets set her up for Telehealt  plz

## 2019-01-06 NOTE — Telephone Encounter (Signed)
FYI:  I called pt . Pt states that she cough up something yesterday and feel better cough still there and she think that it might be allergies. Pt would like to know why Dr. Pamella Pert refused refill of  traMADol (ULTRAM) 50 MG tablet

## 2019-01-06 NOTE — Telephone Encounter (Signed)
Copied from [place] [id number]. Topic: Quick Communication - Rx Refill/Question >> Jan 06, 2019  2:24 PM Keene Breath wrote: Medication: traMADol (ULTRAM) 50 MG tablet  Patient called to request a refill for the above medication  Preferred Pharmacy (with phone number or street name): [facility] [place] [facility], [place] - [place] [phone] (Phone) [phone] (Fax)

## 2019-01-06 NOTE — Telephone Encounter (Signed)
Spoke with the pt and made her aware of Dr. Olin Pia response below. Pt sts that she has 4 more weeks of abx.  Offered pt an e-visit with Dr.Klein and she is agreeable. E-vist scheduled for 01/07/19 @ 11am. Pt sts that she has an in office visit with her infectious disease Physician on 4/2 @ 10am, she will contact the office if she needs to reschedule the e-visit with Dr.Klein.      TELEPHONE CALL NOTE    TELEPHONE CALL NOTE  This patient has been deemed a candidate for follow-up tele-health visit to limit community exposure during the Covid-19 pandemic The patient was advised to review the section on consent for treatment as well. The patient will receive a phone call prior to their E-Visit at which time consent will be verbally confirmed. A Virtual Office Visit appointment type has been scheduled for 01/07/19 @11am  with Dr.Klein, with "VIDEO" or "TELEPHONE" in the appointment notes.  Vickie Laundry, RN 01/06/2019 10:48 AM     [name], Izell Kiowa, RN 01/06/2019 10:47 AM

## 2019-01-06 NOTE — Telephone Encounter (Signed)
Call attempted to reschedule patient to a telephone or video visit instead of the scheduled face to face visit with Christell Faith, PA on 01/19/2019 due to current clinic policies related to COVID 19 virus precautions. Left voicemail requesting for patient to call back to discuss.

## 2019-01-07 ENCOUNTER — Ambulatory Visit: Payer: PPO | Attending: Infectious Diseases | Admitting: Infectious Diseases

## 2019-01-07 ENCOUNTER — Encounter: Payer: Self-pay | Admitting: Infectious Diseases

## 2019-01-07 ENCOUNTER — Telehealth (INDEPENDENT_AMBULATORY_CARE_PROVIDER_SITE_OTHER): Payer: PPO | Admitting: Internal Medicine

## 2019-01-07 ENCOUNTER — Other Ambulatory Visit: Payer: Self-pay

## 2019-01-07 ENCOUNTER — Telehealth: Payer: PPO | Admitting: Internal Medicine

## 2019-01-07 VITALS — BP 115/73 | HR 100 | Temp 97.4°F | Ht 68.0 in

## 2019-01-07 VITALS — BP 113/73 | Wt 269.0 lb

## 2019-01-07 DIAGNOSIS — Z8701 Personal history of pneumonia (recurrent): Secondary | ICD-10-CM

## 2019-01-07 DIAGNOSIS — R7881 Bacteremia: Secondary | ICD-10-CM

## 2019-01-07 DIAGNOSIS — Z79899 Other long term (current) drug therapy: Secondary | ICD-10-CM | POA: Diagnosis not present

## 2019-01-07 DIAGNOSIS — S32030D Wedge compression fracture of third lumbar vertebra, subsequent encounter for fracture with routine healing: Secondary | ICD-10-CM | POA: Diagnosis not present

## 2019-01-07 DIAGNOSIS — Z8619 Personal history of other infectious and parasitic diseases: Secondary | ICD-10-CM | POA: Diagnosis not present

## 2019-01-07 DIAGNOSIS — R0602 Shortness of breath: Secondary | ICD-10-CM | POA: Diagnosis not present

## 2019-01-07 DIAGNOSIS — Z89422 Acquired absence of other left toe(s): Secondary | ICD-10-CM

## 2019-01-07 DIAGNOSIS — I2510 Atherosclerotic heart disease of native coronary artery without angina pectoris: Secondary | ICD-10-CM

## 2019-01-07 DIAGNOSIS — Z872 Personal history of diseases of the skin and subcutaneous tissue: Secondary | ICD-10-CM

## 2019-01-07 DIAGNOSIS — I5022 Chronic systolic (congestive) heart failure: Secondary | ICD-10-CM

## 2019-01-07 DIAGNOSIS — B9561 Methicillin susceptible Staphylococcus aureus infection as the cause of diseases classified elsewhere: Secondary | ICD-10-CM

## 2019-01-07 DIAGNOSIS — Z95828 Presence of other vascular implants and grafts: Secondary | ICD-10-CM | POA: Diagnosis not present

## 2019-01-07 DIAGNOSIS — I255 Ischemic cardiomyopathy: Secondary | ICD-10-CM | POA: Diagnosis not present

## 2019-01-07 MED ORDER — TRAMADOL HCL 50 MG PO TABS: 50.0000 mg | ORAL_TABLET | Freq: Four times a day (QID) | ORAL | 5 refills | Status: DC | PRN

## 2019-01-07 NOTE — Telephone Encounter (Signed)
Pt informed of refill. °

## 2019-01-07 NOTE — Patient Instructions (Signed)
Medication Instructions:  - Your physician has recommended you make the following change in your medication:   1) Torsemide 20 mg- take 2 tablets (40 mg) daily in the morning, you may take 1 tablet (20 mg) in the afternoon as needed for increased swelling/ shortness of breath  If you need a refill on your cardiac medications before your next appointment, please call your pharmacy.   Lab work: - Your physician recommends that you return for lab work in: 8-10 weeks (BMP/ CBC) - can be done the day of your echo  If you have labs (blood work) drawn today and your tests are completely normal, you will receive your results only by: Marland Kitchen MyChart Message (if you have MyChart) OR . A paper copy in the mail If you have any lab test that is abnormal or we need to change your treatment, we will call you to review the results.  Testing/Procedures:- - Your physician has requested that you have an echocardiogram- in 8-10 weeks.. Echocardiography is a painless test that uses sound waves to create images of your heart. It provides your doctor with information about the size and shape of your heart and how well your heart's chambers and valves are working. This procedure takes approximately one hour. There are no restrictions for this procedure.   Follow-Up: At [facility], you and your health needs are our priority.  As part of our continuing mission to provide you with exceptional heart care, we have created designated Provider Care Teams.  These Care Teams include your primary Cardiologist (physician) and Advanced Practice Providers (APPs -  Physician Assistants and Nurse Practitioners) who all work together to provide you with the care you need, when you need it. . in 8-10 weeks with Dr. Caryl Comes (or just after your echo is done)  Any Other Special Instructions Will Be Listed Below (If Applicable). - N/A

## 2019-01-07 NOTE — Progress Notes (Signed)
NAME: [name]  DOB: 1956-08-15  MRN: [id number]  Date/Time: 01/07/2019 10:55 AM  Subjective:  Follow up after hospital discharge [name] is a 63 y.o. 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 She does not have a AICD- has a tele appt with cardiologist Her last day of antibiotic is on 01/22/19 when she will finish 6 weeks then. Her AICD was removed on 12/11/18 No fever or chills No diarrhea Some cough- says it could be allegy  History as follows Pt was initially admitted to [facility] between 12/23-10/09/18 Blood culture on 12/23 had 3 of 3 sets positive for MSSA She was given cefazolin. 12/24 BC neg At that time she had an infected left toe  She had an AICD and TEE done on 10/03/19  was neg- she underwent amputation of the l;eft 2nd toe on 10/08/18 As there was a source for the MSSA and it was being treated,AICD was not removed. The plan was to give 6 weeks of IV cefazolin and then repeat blood culture off antibiotic every week. She was hospitalized twice after that, once for pneumonia between 1/7-1/10/20 Blood culture done on 10/13/18 was neg.. Was admitted to [facility] again for CHF exacerbation between 1/25-1/29/20 She completed IV cefazolin on  11/19/18  Had blood culture done on 11/17/18 and it was neg. She presented to the ED on 11/22/18  with altered mental status. Blood culture on 2/16 was positive for MSSA. I saw her them and started nafcillin and repeat BC from 2/19 was neg. The ICD was considered infected and she was sent to [facility] for removal on 11/28/18. 11/29/18 1 of 2 BC positive for MSSA.   TEE done on 11/30/18 showed a 87mX22mm mobile mass tricuspid valve/lead 2/25 BC NG On 2/26 MRI lumbar spine-no discitis,. .  She had  angiovac on 2/28 and the thrombus/vegatation  was removed.  On 2/29 BC was NG On 3/6 she had the ICD removed [facility] to complete 6 weeks of IV cefazolin      Past Medical History:  Diagnosis Date  . Acute myocardial infarction, subendocardial infarction, initial episode of care ([place] 07/21/2012  . Acute osteomyelitis involving ankle and foot ([facility] 02/06/2015  . Acute systolic heart failure ([place] 07/21/2012   [facility]12[facility] Elevated [facility].  S/p 2D-echo with EF 20-25%.  S/p cardiac catheterization with stenting LAD.  Repeat 2D-echo 10/2011 with improved EF of 35%.   . Anemia   . Automatic implantable cardioverter-defibrillator in situ    a. MDT CRT-D 06/2014, SN:: [id number]    . CAD (coronary artery disease)    a. cardiac cath 101/04/2012: PCI/DES to chronically occluded mLAD, consideration PCI to diag branch in 4 weeks.   . Cataract   . Chicken pox   . Chronic kidney disease   . Chronic systolic CHF (congestive heart failure) (HCC)    a. mixed ICM & NICM; b. EF 20-25% by echo 07/2012, mid-dist 2/3 of LV sev HK/AK, mild MR. echo 10/2012: EF 30-35%, sev HK ant-septal & inf walls, GR1DD, mild MR, PASP 33. c. echo 02/2013: EF 30%, GR1DD, mild MR. echo 04/2014: EF 30%, Septal-lat dyssynchrony, global HK, inf AK, GR1DD,  mild MR. d. echo 10/2014: EF50-55%, WM nl, GR1DD, septal mild paradox. e. echo 02/2015: EF 50-55%, wm   . Depression   . Heart attack ([place])   . Heel ulcer ([place]) 04/27/2015  . History of blood transfusion ~ 2011   "plasma; had neuropathy; couldn't walk"  . Hypertension   . LBBB (left bundle branch block)   . Neuromuscular disorder ([place])   . Neuropathy 2011  . Obesity, unspecified   . OSA on CPAP    Moderate with AHI 23/hr and now on CPAP at 16cm H2O.  Her DME is [facility]  . Pure hypercholesterolemia   . Type II diabetes mellitus ([place])   . Unspecified vitamin D deficiency     ?  Past Medical History:  Diagnosis Date  . Acute myocardial infarction, subendocardial infarction, initial episode of care ([place]) 07/21/2012  . Acute osteomyelitis involving ankle and foot ([place]) 02/06/2015  . Acute systolic heart failure ([place]) 07/21/2012   New onset 07/19/12; admission to [facility] ED. Elevated Troponins.  S/p 2D-echo with EF 20-25%.  S/p cardiac catheterization with stenting LAD.  Repeat 2D-echo 10/2011 with improved EF of 35%.   . Anemia   . Automatic implantable cardioverter-defibrillator in situ    a. MDT CRT-D 06/2014, SN: [id number]    . CAD (coronary artery disease)    a. cardiac cath 101/04/2012: PCI/DES to chronically occluded mLAD, consideration PCI to diag branch in 4 weeks.   . Cataract   . Chicken pox   . Chronic kidney disease   . Chronic systolic CHF (congestive heart failure) (HCC)    a. mixed ICM & NICM; b. EF 20-25% by echo 07/2012, mid-dist 2/3 of LV sev HK/AK, mild MR. echo 10/2012: EF 30-35%, sev HK ant-septal & inf walls, GR1DD, mild MR, PASP 33. c. echo 02/2013: EF 30%, GR1DD, mild MR. echo 04/2014: EF 30%, Septal-lat dyssynchrony, global HK, inf AK, GR1DD, mild MR. d. echo 10/2014: EF50-55%, WM nl, GR1DD, septal mild paradox. e. echo 02/2015: EF 50-55%, wm   . Depression   . Heart attack ([place])   . Heel ulcer ([place]) 04/27/2015  . History of blood transfusion ~ 2011   "plasma; had neuropathy; couldn't walk"  . Hypertension   . LBBB (left bundle branch block)   . Neuromuscular disorder ([place])   . Neuropathy 2011  . Obesity, unspecified   . OSA on CPAP    Moderate with AHI 23/hr and now on CPAP at 16cm H2O.  Her DME is [facility]  . Pure hypercholesterolemia   . Type II diabetes mellitus ([place])   . Unspecified vitamin D deficiency     Past Surgical History:  Procedure Laterality Date  . AMPUTATION TOE Right 06/18/2015   Procedure: AMPUTATION TOE;  Surgeon: Samara Deist, DPM;  Location: [facility] ORS;  Service: Podiatry;  Laterality: Right;  . AMPUTATION TOE Left  10/08/2018   Procedure: AMPUTATION TOE LEFT 2ND;  Surgeon: Albertine Patricia, DPM;  Location: [facility] ORS;  Service: Podiatry;  Laterality: Left;  . BACK SURGERY    . BI-VENTRICULAR IMPLANTABLE CARDIOVERTER DEFIBRILLATOR N/A 07/06/2014   Procedure: BI-VENTRICULAR IMPLANTABLE CARDIOVERTER DEFIBRILLATOR  (CRT-D);  Surgeon: Deboraha Sprang, MD;  Location: [facility] CATH LAB;  Service: Cardiovascular;  Laterality: N/A;  . BI-VENTRICULAR IMPLANTABLE CARDIOVERTER DEFIBRILLATOR  (CRT-D)  07/06/2014  . BILATERAL OOPHORECTOMY  01/2011   ovarian cyst benign  . COLONOSCOPY WITH PROPOFOL Left 02/22/2015   Procedure: COLONOSCOPY WITH PROPOFOL;  Surgeon: Hulen Luster, MD;  Location: [facility] ENDOSCOPY;  Service: Endoscopy;  Laterality: Left;  . CORONARY ANGIOPLASTY WITH STENT PLACEMENT Left 07/2012   new onset systolic CHF; elevated troponins.  Cardiac catheterization with stenting to LAD; EF 15%.  2D-echo: EF 20-25%.  . ESOPHAGOGASTRODUODENOSCOPY N/A 02/22/2015   Procedure: ESOPHAGOGASTRODUODENOSCOPY (EGD);  Surgeon: Hulen Luster, MD;  Location[facility] ENDOSCOPY;  Service: Endoscopy;  Laterality: N/A;  . INCISION AND DRAINAGE ABSCESS Right 2007   groin; with ICU stay due to sepsis.  Marland Kitchen LAPAROSCOPIC CHOLECYSTECTOMY  2011  . LEFT HEART CATH AND CORONARY ANGIOGRAPHY N/A 10/05/2018   Procedure: LEFT HEART CATH AND CORONARY ANGIOGRAPHY;  Surgeon: Minna Merritts, MD;  Location: [place] CV LAB;  Service: Cardiovascular;  Laterality: N/A;  . LEFT HEART CATHETERIZATION WITH CORONARY ANGIOGRAM N/A 07/21/2012   Procedure: LEFT HEART CATHETERIZATION WITH CORONARY ANGIOGRAM;  Surgeon: Jolaine Artist, MD;  Location: [facility] CATH LAB;  Service: Cardiovascular;  Laterality: N/A;  . [place]   L4-5  . PERCUTANEOUS CORONARY STENT INTERVENTION (PCI-S) N/A 07/23/2012   Procedure: PERCUTANEOUS CORONARY STENT INTERVENTION (PCI-S);  Surgeon: Sherren Mocha, MD;  Location: [facility] CATH LAB;  Service: Cardiovascular;  Laterality: N/A;  .  PERIPHERAL VASCULAR BALLOON ANGIOPLASTY Left 10/06/2018   Procedure: PERIPHERAL VASCULAR BALLOON ANGIOPLASTY;  Surgeon: Katha Cabal, MD;  Location: [place] CV LAB;  Service: Cardiovascular;  Laterality: Left;  . PERIPHERAL VASCULAR CATHETERIZATION N/A 02/10/2015   Procedure: Picc Line Insertion;  Surgeon: Katha Cabal, MD;  Location: [place] CV LAB;  Service: Cardiovascular;  Laterality: N/A;  . TEE WITHOUT CARDIOVERSION N/A 10/02/2018   Procedure: TRANSESOPHAGEAL ECHOCARDIOGRAM (TEE);  Surgeon: Wellington Hampshire, MD;  Location: [facility] ORS;  Service: Cardiovascular;  Laterality: N/A;  . [place]  . VAGINAL HYSTERECTOMY  01/2011   Fibroids/DUB.  Ovaries removed. [place].    Social History   Socioeconomic History  . Marital status: Married    Spouse name: [name]  . Number of children: 2  . Years of education: 7  . Highest education level: High school graduate  Occupational History  . Occupation: disabled    Fish farm manager: UNEMPLOYED    Comment: 03/2010 for peripheral neuropathy  . 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  Social Needs  . Financial resource strain: Not hard at all  . Food insecurity:    Worry: Never true    Inability: Never true  . Transportation needs:    Medical: No    Non-medical: No  Tobacco Use  . Smoking status: Never Smoker  . Smokeless tobacco: Never Used  Substance and Sexual Activity  . Alcohol use: No    Alcohol/week: 0.0 standard drinks  . Drug use: No  . Sexual activity: Never    Birth control/protection: Post-menopausal, Surgical  Lifestyle  . Physical activity:    Days per week: 0 days    Minutes per session: 0 min  . Stress: Rather much  Relationships  . Social connections:    Talks on phone: More than three times a week    Gets together: More than three times a week    Attends religious service: Never    Active member of club or organization: No    Attends meetings of clubs or organizations: Never     Relationship status: Married  . Intimate partner violence:    Fear of current or ex partner: No    Emotionally abused: No    Physically abused: No    Forced sexual activity: No  Other Topics  Concern  . Not on file  Social History Narrative   Marital status:Married x 45 yrs. Happily married, no abuse.       Children:  2 children (daughter 71, son 40). Two grandsons and 2 step grandchildren.  1 gg.      Lives: with husband, daughter, son-in-law, 2 grandsons, and 1 on the way.      Employment: disability for peripheral neuropathy 2012.  Previously had home daycare.      Tobacco: none      Alcohol: none      Drugs: none      Exercise: none. [employer].      Pets: dog.      Always uses seat belts, smoke detectors in home.      No guns in the home.       Caffeine use: 2 cups coffee per day.       Nutrition: Well balanced diet.    Family History  Problem Relation Age of Onset  . Diabetes Mother   . Hypertension Mother   . Arthritis Mother        knees, lumbar DDD, cervical DDD  . Cancer Father        prostate,skin,lymphoma.  . Cancer Brother 17       bladder cancer; non-smoker  . Diabetes Maternal Grandmother   . Heart disease Maternal Grandmother   . COPD Maternal Grandmother   . Diabetes Paternal Grandmother   . Obesity Brother   . Diabetes Son   . Hypertension Son   . Cancer Maternal Grandfather   . Diabetes Paternal Grandfather    No Known Allergies  ? Current Outpatient Medications  Medication Sig Dispense Refill  . aspirin EC 81 MG EC tablet Take 1 tablet (81 mg total) by mouth daily. 30 tablet 0  . carvedilol (COREG) 6.25 MG tablet Take 1 tablet (6.25 mg total) by mouth 2 (two) times daily with a meal. 60 tablet 2  . ceFAZolin (ANCEF) IVPB Inject 2 g into the vein every 8 (eight) hours. Indication:  MSSA Bacteremia, Tricuspid valve endocarditis/Mitral Valve Vegetation  Last Day of Therapy:  01/22/2019 Labs - Once weekly:  CBC/D and CMP, Labs - Every other  week:  ESR and CRP Please leave PICC in place until doctor has seen patient or been notified. 114 Units 0  . clopidogrel (PLAVIX) 75 MG tablet Take 1 tablet (75 mg total) by mouth daily. 90 tablet 0  . gabapentin (NEURONTIN) 600 MG tablet Take 1 tablet (600 mg total) by mouth 2 (two) times daily. Takes 1 in the morning and two at bedtime 180 tablet 1  . glimepiride (AMARYL) 4 MG tablet Take 4 mg by mouth 2 (two) times daily.    . insulin glargine (LANTUS) 100 UNIT/ML injection Inject 0.16 mLs (16 Units total) into the skin at bedtime. 10 mL 11  . losartan (COZAAR) 50 MG tablet Take 0.5 tablets (25 mg total) by mouth daily. 30 tablet 2  . potassium chloride (K-DUR,KLOR-CON) 10 MEQ tablet Take 1 tablet (10 mEq total) by mouth as needed. On days you take the 3rd torsemide dose 60 tablet 0  . torsemide (DEMADEX) 20 MG tablet Take 1 tablet (20 mg total) by mouth 2 (two) times daily. And additional 43m if needed for a 3rd dose 30 tablet 1  . traMADol (ULTRAM) 50 MG tablet Take 1 tablet (50 mg total) by mouth every 6 (six) hours as needed. 120 tablet 5   No current facility-administered medications  for this visit.      Abtx:  Anti-infectives (From admission, onward)   None      REVIEW OF SYSTEMS:  Const: negative fever, negative chills, some  weight loss as she is less edematous now Eyes: negative diplopia or visual changes, negative eye pain ENT: negative coryza, negative sore throat Resp:  cough, hemoptysis, some dyspnea Cards: negative for chest pain, palpitations, lower extremity edema GU: negative for frequency, dysuria and hematuria GI: Negative for abdominal pain, diarrhea, bleeding, constipation Skin: negative for rash and pruritus Heme: negative for easy bruising and gum/nose bleeding MS: negative for myalgias, arthralgias, back pain and muscle weakness Neurolo:negative for headaches, dizziness, vertigo, memory problems  Psych: negative for feelings of anxiety, depression   Endocrine: negative for thyroid, diabetes Allergy/Immunology- negative for any medication or food allergies ? Objective:  VITALS:  BP 115/73 (BP Location: Right Arm, Patient Position: Sitting, Cuff Size: Large)   Pulse 100   Temp (!) 97.4 F (36.3 C) (Oral)   Ht _0  (1.727 m)   BMI 43.33 kg/m  PHYSICAL EXAM:  General: Alert, cooperative, no distress, appears stated age.pale  ENT Nares normal. No drainage or sinus tenderness. Lips, mucosa, and tongue normal. No Thrush Neck: Supple, symmetrical, no adenopathy, thyroid: non tender no carotid bruit and no JVD. Back: No CVA tenderness. Lungs: Clear to auscultation bilaterally. No Wheezing or Rhonchi. No rales. Heart: Regular rate and rhythm, no murmur, rub or gallop. Abdomen: not examined Extremities: edema ankle Rt PICC-site well Skin: No rashes or lesions. Or bruising Lymph: Cervical, supraclavicular normal. Neurologic: Grossly non-focal Pertinent Labs Lab Results from 01/04/19 WBC 4.4 HB 8.9 PLT 201 ESR 51 CRP 10 ( N) Cr 1.28    ? 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 Blood culture from 2/25 and 2/29 and 3/10  Neg for MSSA  On 6 weeks of IV cefazolin 2 grams Q 8 from the day of explantation Until 01/22/19  ? AKI on CKD- has resolved- crcl> 50 now  1st episode of MSSA bacteremia in Dec 2019 with abscess/osteomyelitis of 2nd left toe leading to amputation on 10/08/18 with 6 weeks of IV cefazolin which she completed on 11/17/18  CHF /CAD- seh is following with Dr.Klein today by telemed   Anemia: improved and stable ? Will do blood culture 5 days after completing IV ? _________________________________________________ Discussed with patient

## 2019-01-07 NOTE — Progress Notes (Signed)
Electrophysiology TeleHealth Note   Due to national recommendations of social distancing due to COVID 19, an audio/video telehealth visit is felt to be most appropriate for this patient at this time.  See MyChart message from today for the patient's consent to telehealth for [facility].   Date:  01/07/2019   ID:  [name], DOB 1955/11/28, MRN [id number]  Location: patient's home  Provider location: [address], [place]  Evaluation Performed: Follow-up visit  PCP:  Rutherford Guys, MD  Cardiologist:  Ida Rogue, MD   Electrophysiologist:  SK  Chief Complaint:  S/P CRT explant 2/2 MSSA bacteremia  History of Present Illness:    [name] is a 63 y.o. female who presents via audio/video conferencing for a telehealth visit today.   Hx of LBBB NICM w prior CRT for primary prevention and interval improvement in LV function  No Hx of therapy ( reviewed to implant)   DATE TEST EF   9/15  echo  30 %   1 /17 echo  50 %   12/19 Echo  45-50%   12/19 TEE  No vegetations  12/19 LHC  pLAD80 with ISR; mLAD70 pCx 80;D1 21 mRCA 40       Since last being seen in our clinic, the patient reports having been hospitalized x2 for MSSA bacteremia, the second time following 6 weeks of ABX.  Tx to [facility] and underwent aspiration of TV/lead thrombus and subsequent extraction.  CT suggestive of septic emboli Discharge without LifeVest   Saw ID this am;  Abx due to complete 4/17  Struggling with SOB  And diuretics are being adjusted by [facility]  Dyspnea improved with increased torsemide But still very limited < 30 ft  Also on iron supplements  The patient denies symptoms of fevers, chills, cough, or new SOB worrisome for COVID 19.   Past Medical History:  Diagnosis Date   Acute myocardial infarction, subendocardial infarction, initial episode of care ([place]) 07/21/2012   Acute osteomyelitis involving ankle and foot ([place]) 11/11/8525   Acute systolic  heart failure ([place]) 07/21/2012   New onset 07/19/12; admission to [facility] ED. Elevated Troponins.  S/p 2D-echo with EF 20-25%.  S/p cardiac catheterization with stenting LAD.  Repeat 2D-echo 10/2011 with improved EF of 35%.    Anemia    Automatic implantable cardioverter-defibrillator in situ    a. MDT CRT-D 06/2014, SN: [id number]     CAD (coronary artery disease)    a. cardiac cath 101/04/2012: PCI/DES to chronically occluded mLAD, consideration PCI to diag branch in 4 weeks.    Cataract    Chicken pox    Chronic kidney disease    Chronic systolic CHF (congestive heart failure) (HCC)    a. mixed ICM & NICM; b. EF 20-25% by echo 07/2012, mid-dist 2/3 of LV sev HK/AK, mild MR. echo 10/2012: EF 30-35%, sev HK ant-septal & inf walls, GR1DD, mild MR, PASP 33. c. echo 02/2013: EF 30%, GR1DD, mild MR. echo 04/2014: EF 30%, Septal-lat dyssynchrony, global HK, inf AK, GR1DD, mild MR. d. echo 10/2014: EF50-55%, WM nl, GR1DD, septal mild paradox. e. echo 02/2015: EF 50-55%, wm    Depression    Heart attack ([facility])    Heel ulcer ([place]) 04/27/2015   History of blood transfusion ~ 2011   "plasma; had neuropathy; couldn't walk"   Hypertension    LBBB (left bundle branch block)    Neuromuscular disorder ([place])    Neuropathy 2011  Obesity, unspecified    OSA on CPAP    Moderate with AHI 23/hr and now on CPAP at 16cm H2O.  Her DME is [facility]   Pure hypercholesterolemia    Type II diabetes mellitus ([place])    Unspecified vitamin D deficiency     Past Surgical History:  Procedure Laterality Date   AMPUTATION TOE Right 06/18/2015   Procedure: AMPUTATION TOE;  Surgeon: Samara Deist, DPM;  Location: [facility] ORS;  Service: Podiatry;  Laterality: Right;   AMPUTATION TOE Left 10/08/2018   Procedure: AMPUTATION TOE LEFT 2ND;  Surgeon: Albertine Patricia, DPM;  Location: [facility] ORS;  Service: Podiatry;  Laterality: Left;   BACK SURGERY     BI-VENTRICULAR IMPLANTABLE CARDIOVERTER DEFIBRILLATOR N/A 07/06/2014    Procedure: BI-VENTRICULAR IMPLANTABLE CARDIOVERTER DEFIBRILLATOR  (CRT-D);  Surgeon: Deboraha Sprang, MD;  Location: [facility] CATH LAB;  Service: Cardiovascular;  Laterality: N/A;   BI-VENTRICULAR IMPLANTABLE CARDIOVERTER DEFIBRILLATOR  (CRT-D)  07/06/2014   BILATERAL OOPHORECTOMY  01/2011   ovarian cyst benign   COLONOSCOPY WITH PROPOFOL Left 02/22/2015   Procedure: COLONOSCOPY WITH PROPOFOL;  Surgeon: Hulen Luster, MD;  Location: [facility] ENDOSCOPY;  Service: Endoscopy;  Laterality: Left;   CORONARY ANGIOPLASTY WITH STENT PLACEMENT Left 07/2012   new onset systolic CHF; elevated troponins.  Cardiac catheterization with stenting to LAD; EF 15%.  2D-echo: EF 20-25%.   ESOPHAGOGASTRODUODENOSCOPY N/A 02/22/2015   Procedure: ESOPHAGOGASTRODUODENOSCOPY (EGD);  Surgeon: Hulen Luster, MD;  Location: [facility] ENDOSCOPY;  Service: Endoscopy;  Laterality: N/A;   INCISION AND DRAINAGE ABSCESS Right 2007   groin; with ICU stay due to sepsis.   LAPAROSCOPIC CHOLECYSTECTOMY  2011   LEFT HEART CATH AND CORONARY ANGIOGRAPHY N/A 10/05/2018   Procedure: LEFT HEART CATH AND CORONARY ANGIOGRAPHY;  Surgeon: Minna Merritts, MD;  Location: [place] CV LAB;  Service: Cardiovascular;  Laterality: N/A;   LEFT HEART CATHETERIZATION WITH CORONARY ANGIOGRAM N/A 07/21/2012   Procedure: LEFT HEART CATHETERIZATION WITH CORONARY ANGIOGRAM;  Surgeon: Jolaine Artist, MD;  Location: [facility] CATH LAB;  Service: Cardiovascular;  Laterality: N/A;   LUMBAR DISC SURGERY  1996   L4-5   PERCUTANEOUS CORONARY STENT INTERVENTION (PCI-S) N/A 07/23/2012   Procedure: PERCUTANEOUS CORONARY STENT INTERVENTION (PCI-S);  Surgeon: Sherren Mocha, MD;  Location: [facility] CATH LAB;  Service: Cardiovascular;  Laterality: N/A;   PERIPHERAL VASCULAR BALLOON ANGIOPLASTY Left 10/06/2018   Procedure: PERIPHERAL VASCULAR BALLOON ANGIOPLASTY;  Surgeon: Katha Cabal, MD;  Location: [place] CV LAB;  Service: Cardiovascular;  Laterality: Left;    PERIPHERAL VASCULAR CATHETERIZATION N/A 02/10/2015   Procedure: Picc Line Insertion;  Surgeon: Katha Cabal, MD;  Location: [place] CV LAB;  Service: Cardiovascular;  Laterality: N/A;   TEE WITHOUT CARDIOVERSION N/A 10/02/2018   Procedure: TRANSESOPHAGEAL ECHOCARDIOGRAM (TEE);  Surgeon: Wellington Hampshire, MD;  Location: [facility] ORS;  Service: Cardiovascular;  Laterality: N/A;   [place]  01/2011   Fibroids/DUB.  Ovaries removed. [place].    Current Outpatient Medications  Medication Sig Dispense Refill   aspirin EC 81 MG EC tablet Take 1 tablet (81 mg total) by mouth daily. 30 tablet 0   carvedilol (COREG) 6.25 MG tablet Take 1 tablet (6.25 mg total) by mouth 2 (two) times daily with a meal. 60 tablet 2   ceFAZolin (ANCEF) IVPB Inject 2 g into the vein every 8 (eight) hours. Indication:  MSSA Bacteremia, Tricuspid valve endocarditis/Mitral Valve Vegetation  Last Day of Therapy:  01/22/2019 Labs - Once  weekly:  CBC/D and CMP, Labs - Every other week:  ESR and CRP Please leave PICC in place until doctor has seen patient or been notified. 114 Units 0   clopidogrel (PLAVIX) 75 MG tablet Take 1 tablet (75 mg total) by mouth daily. 90 tablet 0   gabapentin (NEURONTIN) 600 MG tablet Take 1 tablet (600 mg total) by mouth 2 (two) times daily. Takes 1 in the morning and two at bedtime 180 tablet 1   glimepiride (AMARYL) 4 MG tablet Take 4 mg by mouth 2 (two) times daily.     insulin glargine (LANTUS) 100 UNIT/ML injection Inject 0.16 mLs (16 Units total) into the skin at bedtime. 10 mL 11   losartan (COZAAR) 50 MG tablet Take 0.5 tablets (25 mg total) by mouth daily. 30 tablet 2   potassium chloride (K-DUR,KLOR-CON) 10 MEQ tablet Take 1 tablet (10 mEq total) by mouth as needed. On days you take the 3rd torsemide dose 60 tablet 0   torsemide (DEMADEX) 20 MG tablet Take 1 tablet (20 mg total) by mouth 2 (two) times daily. And additional 4m if  needed for a 3rd dose 30 tablet 1   traMADol (ULTRAM) 50 MG tablet Take 1 tablet (50 mg total) by mouth every 6 (six) hours as needed. 120 tablet 5   No current facility-administered medications for this visit.     Allergies:   Patient has no known allergies.   Social History:  The patient  reports that she has never smoked. She has never used smokeless tobacco. She reports that she does not drink alcohol or use drugs.   Family History:  The patient's   family history includes Arthritis in her mother; COPD in her maternal grandmother; Cancer in her father and maternal grandfather; Cancer (age of onset: 561 in her brother; Diabetes in her maternal grandmother, mother, paternal grandfather, paternal grandmother, and son; Heart disease in her maternal grandmother; Hypertension in her mother and son; Obesity in her brother.   ROS:  Please see the history of present illness.   All other systems are personally reviewed and negative.    Exam:    Vital Signs:  BP 113/73 (BP Location: Left Arm, Patient Position: Sitting, Cuff Size: Normal)    Wt 269 lb (122 kg)    BMI 40.90 kg/m     Well appearing, alert and conversant, regular work of breathing,  good skin color Eyes- anicteric, neuro- grossly intact, skin- no apparent rash or lesions or cyanosis, mouth- oral mucosa is pink   Labs/Other Tests and Data Reviewed:    Recent Labs: 11/22/2018: B Natriuretic Peptide 2,246.0; TSH 4.684 11/23/2018: Magnesium 1.3 12/24/2018: ALT <5; BUN 24; Creatinine, Ser 1.48; Hemoglobin 8.8; Platelets 265; Potassium 4.9; Sodium 141   Wt Readings from Last 3 Encounters:  01/07/19 269 lb (122 kg)  12/24/18 285 lb (129.3 kg)  12/23/18 286 lb 8 oz (130 kg)     Other studies personally reviewed: Additional studies/ records that were reviewed today include: [facility] notes  Review of the above records today demonstrates:As above      ASSESSMENT & PLAN:     Ischemic cardiomyopathy-interval near normalization    [facility]  [facility] See Below       MSSA bacteremia recurrent   CHF acute/chronic diastolic  Anemia  LBBB  Renal Insufficiency  Followed closely by renal  Pt with more dyspnea, which is likely multifactorial.  Known CAD, LBBB, presumed septic emboli, obesity ? Lung disease, and anemia  Has responded to increase diuretic which supports a significant contribution from her heart-- with loss of CRT there will be loss of synchrony and possible worsening LV function  We have discussed the physiology of heart failure including the importance of salt restriction and fluid restriction and have reviewed sources of dietary salt and water.   Addressing this will have to wait until after COVID and so will see her in about 2 months  For now consolidate demadex to 40 q AM and use 20 qpm prn  Will need reassessment of renal function and Hgb atnext lab draw     COVID 19 screen The patient denies symptoms of COVID 19 at this time.  The importance of social distancing was discussed today.  Follow-up:  10 w    Current medicines are reviewed at length with the patient today.   The patient does not have concerns regarding her medicines.  The following changes were made today:  toremide 20 bid>> 40 daily and 20 qpm prn Continue Fe replacement  Labs/ tests ordered today include:   Future tests ( post COVID )  BMET CBC ECHO in 2-3   months  Patient Risk:  after full review of this patients clinical status, I feel that they are at moderate risk at this time.  Today, I have spent 52mnutes with the patient with telehealth technology discussing the above.  Signed, SVirl Axe MD  01/07/2019 3:29 PM     [address] [phone](office) [phone](fax)

## 2019-01-07 NOTE — Telephone Encounter (Signed)
I dont recall denying her tramadol. I just refilled.  Pmp reviewed thanks

## 2019-01-07 NOTE — Addendum Note (Signed)
Addended by: Alvis Lemmings C on: 01/07/2019 04:54 PM   Modules accepted: Orders

## 2019-01-07 NOTE — Patient Instructions (Addendum)
You here for follow up for MSSA bacteremia, tricuspid valve endocarditis, had angiovac, pacemaker was removed on 12/11/18 and she was discharged on 6 weeks of IV cefazolin until 01/22/19. Will get a blood culture 5 days after completing antibiotic and if neg will then remove PICC

## 2019-01-08 ENCOUNTER — Other Ambulatory Visit: Payer: Self-pay | Admitting: Family Medicine

## 2019-01-09 ENCOUNTER — Other Ambulatory Visit: Payer: Self-pay | Admitting: Family Medicine

## 2019-01-11 DIAGNOSIS — R079 Chest pain, unspecified: Secondary | ICD-10-CM | POA: Diagnosis not present

## 2019-01-11 DIAGNOSIS — R7889 Finding of other specified substances, not normally found in blood: Secondary | ICD-10-CM | POA: Diagnosis not present

## 2019-01-11 DIAGNOSIS — M791 Myalgia, unspecified site: Secondary | ICD-10-CM | POA: Diagnosis not present

## 2019-01-11 DIAGNOSIS — B9561 Methicillin susceptible Staphylococcus aureus infection as the cause of diseases classified elsewhere: Secondary | ICD-10-CM | POA: Diagnosis not present

## 2019-01-11 DIAGNOSIS — T827XXA Infection and inflammatory reaction due to other cardiac and vascular devices, implants and grafts, initial encounter: Secondary | ICD-10-CM | POA: Diagnosis not present

## 2019-01-11 DIAGNOSIS — R918 Other nonspecific abnormal finding of lung field: Secondary | ICD-10-CM | POA: Diagnosis not present

## 2019-01-12 NOTE — Telephone Encounter (Signed)
I attempted to call the patient. I left a message for her to please call back to discuss her C-PAP supplies as Dr. Caryl Comes does not manage this- this would need to be done by her PCP or pulmonary doctor.  I asked that she call back to clarify who started her on this and was managing it.

## 2019-01-12 NOTE — Telephone Encounter (Signed)
In order for me to order new supplies she will need a video call as a face to face isneeded to get supplies since I have not seen her in a while

## 2019-01-12 NOTE — Telephone Encounter (Signed)
Pt needing Rx for CPAP attachments. Pt reports that she has not used in 2 years. She meant to discuss with Dr. Caryl Comes on tele visit 01/07/19 but forgot. At recent hospitalization doctors encouraged her to get back on track using CPAP during sleep hours.   [facility] (spoke to Remo Lipps [phone]) who reported that she needs new Rx for all attachments (face mask etc). Epic shows sleep study last in 2015 although the only documentation I can find is from an appt with "respiratory" on 03/23/15.    Remo Lipps from [employer] will fax request for supplies. Their fax number is [phone].  _____  She has scheduled appt with Christell Faith, PA on 01/19/19. Per Caryl Comes last OV note, Echo and follow up 8-10 weeks. Appt cancelled.   Routing to Dr. Caryl Comes as Juluis Rainier.

## 2019-01-12 NOTE — Telephone Encounter (Signed)
Reviewed the patient's chart. Sleep study was ordered by Dr. Aundra Dubin (CHF clinic)- 06/15/14. The patient had a sleep study in 08/2014. She was followed by Dr Radford Pax after that for sleep.  Last visit with Dr. Radford Pax was 11/08/14. Last phone encounter for sleep w/ Dr. Radford Pax was 01/24/15.  I called [facility] to see if they had any previous orders for the patient for CPAP supplies- per [facility], they have nothing new on this patient.  They have switched to a new system, but looks like the last orders were for O2 from Dr. Radford Pax.   Will forward this message to Dr. Radford Pax to review as I am unsure if it has been longer than 2 years since the patient has used her C-PAP and how to proceed at this time.

## 2019-01-12 NOTE — Telephone Encounter (Addendum)
I spoke with the patient to find out if she could remember who ordered her sleep study or supplies to start with.  Per the patient, she could not remember because it had been awhile. She has not worn her CPAP in 2 years as she didn't realize the importance/ impact it could have on her heart. She was told at her last hospitalization that she needed to be back on this.   I advised her that Dr. Caryl Comes doesn't manage her CPAP/ supplies. She couldn't remember if her PCP did- she had been seeing Dr. [name], but she has relocated- she is seeing Dr. Pamella Pert currently. I advised I would investigate her chart further and follow back up with her.   She is agreeable and voices understanding.

## 2019-01-13 NOTE — Telephone Encounter (Signed)
Message routed to the sleep nurse for video set up with TT.

## 2019-01-14 ENCOUNTER — Encounter (INDEPENDENT_AMBULATORY_CARE_PROVIDER_SITE_OTHER): Payer: PPO | Admitting: Cardiology

## 2019-01-14 ENCOUNTER — Other Ambulatory Visit: Payer: Self-pay

## 2019-01-14 ENCOUNTER — Encounter: Payer: Self-pay | Admitting: Cardiology

## 2019-01-14 ENCOUNTER — Telehealth: Payer: Self-pay | Admitting: Cardiology

## 2019-01-14 VITALS — BP 114/56 | Ht 68.0 in | Wt 278.0 lb

## 2019-01-14 DIAGNOSIS — G4733 Obstructive sleep apnea (adult) (pediatric): Secondary | ICD-10-CM

## 2019-01-14 DIAGNOSIS — I10 Essential (primary) hypertension: Secondary | ICD-10-CM | POA: Diagnosis not present

## 2019-01-14 NOTE — Telephone Encounter (Signed)
I spoke with the patient to follow up on what I have found out about her CPAP and how everything originated.  She is aware that she will need an E-visit with Dr. Radford Pax to re-establish with her and make sure that everything will be current and up to date with her CPAP.  I have advised she will be contacted by Dr. Theodosia Blender staff to arrange follow up. The patient voices understanding and is agreeable.  She was very appreciative for the call back.

## 2019-01-14 NOTE — Progress Notes (Signed)
Virtual Visit via Video Note   This visit type was conducted due to national recommendations for restrictions regarding the COVID-19 Pandemic (e.g. social distancing) in an effort to limit this patient's exposure and mitigate transmission in our community.  Due to her co-morbid illnesses, this patient is at least at moderate risk for complications without adequate follow up.  This format is felt to be most appropriate for this patient at this time.  All issues noted in this document were discussed and addressed.  A limited physical exam was performed with this format.  Please refer to the patient's chart for her consent to telehealth for [facility].  Evaluation Performed:  Follow-up visit  This visit type was conducted due to national recommendations for restrictions regarding the COVID-19 Pandemic (e.g. social distancing).  This format is felt to be most appropriate for this patient at this time.  All issues noted in this document were discussed and addressed.  No physical exam was performed (except for noted visual exam findings with Video Visits).  Please refer to the patient's chart (MyChart message for video visits and phone note for telephone visits) for the patient's consent to telehealth for [facility].  Date:  01/14/2019   ID:  [name], DOB 10/18/1955, MRN [id number]  Patient Location:  Home  Provider location:   [place]  PCP:  Rutherford Guys, MD  Cardiologist:  Ida Rogue, MD  Sleep Medicine:  Fransico Him, MD Electrophysiologist:  None   Chief Complaint:  OSA  History of Present Illness:    [name] is a 63 y.o. female who presents via audio/video conferencing for a telehealth visit today.    This is a 63yo obese female with a history of severe loud snoring and excessive daytime sleepiness.  PSGhe has not seen me in over 4 years and is here for followup.   showed moderate OSA with an overall AHI of 23 events per hour overall and 87 events per  hour during REM sleep.  She had reduced sleep efficiency with increased frequency of arousals from respiratory events and moderate snoring.  She underwent CPAP titration to 16cm H2O.  She is doing well with her CPAP device.  She tolerates the mask and feels the pressure is adequate.  Since going on CPAP she feels rested in the am and has no significant daytime sleepiness.  She denies any significant mouth or nasal dryness or nasal congestion.  She does not think that he snores.  SHe would like to get new PAP supplies.        The patient does not have symptoms concerning for COVID-19 infection (fever, chills, cough, or new shortness of breath).    Prior CV studies:   The following studies were reviewed today:  PAP download  Past Medical History:  Diagnosis Date  . Acute myocardial infarction, subendocardial infarction, initial episode of care ([place]) 07/21/2012  . Acute osteomyelitis involving ankle and foot ([place]) 02/06/2015  . Acute systolic heart failure ([place]) 07/21/2012   New onset 07/19/12; admission to [facility] ED. Elevated Troponins.  S/p 2D-echo with EF 20-25%.  S/p cardiac catheterization with stenting LAD.  Repeat 2D-echo 10/2011 with improved EF of 35%.   . Anemia   . Automatic implantable cardioverter-defibrillator in situ    a. MDT CRT-D 06/2014, SN: [id number]    . CAD (coronary artery disease)    a. cardiac cath 101/04/2012: PCI/DES to chronically occluded mLAD, consideration PCI to diag branch in 4 weeks.   Marland Kitchen  Cataract   . Chicken pox   . Chronic kidney disease   . Chronic systolic CHF (congestive heart failure) (HCC)    a. mixed ICM & NICM; b. EF 20-25% by echo 07/2012, mid-dist 2/3 of LV sev HK/AK, mild MR. echo 10/2012: EF 30-35%, sev HK ant-septal & inf walls, GR1DD, mild MR, PASP 33. c. echo 02/2013: EF 30%, GR1DD, mild MR. echo 04/2014: EF 30%, Septal-lat dyssynchrony, global HK, inf AK, GR1DD, mild MR. d. echo 10/2014: EF50-55%, WM nl, GR1DD, septal mild paradox. e. echo 02/2015:  EF 50-55%, wm   . Depression   . Heart attack ([place])   . Heel ulcer ([place]) 04/27/2015  . History of blood transfusion ~ 2011   "plasma; had neuropathy; couldn't walk"  . Hypertension   . LBBB (left bundle branch block)   . Neuromuscular disorder ([place])   . Neuropathy 2011  . Obesity, unspecified   . OSA on CPAP    Moderate with AHI 23/hr and now on CPAP at 16cm H2O.  Her DME is [facility]  . Pure hypercholesterolemia   . Type II diabetes mellitus ([place])   . Unspecified vitamin D deficiency    Past Surgical History:  Procedure Laterality Date  . AMPUTATION TOE Right 06/18/2015   Procedure: AMPUTATION TOE;  Surgeon: Samara Deist, DPM;  Location: [facility] ORS;  Service: Podiatry;  Laterality: Right;  . AMPUTATION TOE Left 10/08/2018   Procedure: AMPUTATION TOE LEFT 2ND;  Surgeon: Albertine Patricia, DPM;  Location: [facility] ORS;  Service: Podiatry;  Laterality: Left;  . BACK SURGERY    . BI-VENTRICULAR IMPLANTABLE CARDIOVERTER DEFIBRILLATOR N/A 07/06/2014   Procedure: BI-VENTRICULAR IMPLANTABLE CARDIOVERTER DEFIBRILLATOR  (CRT-D);  Surgeon: Deboraha Sprang, MD;  Location: [facility] CATH LAB;  Service: Cardiovascular;  Laterality: N/A;  . BI-VENTRICULAR IMPLANTABLE CARDIOVERTER DEFIBRILLATOR  (CRT-D)  07/06/2014  . BILATERAL OOPHORECTOMY  01/2011   ovarian cyst benign  . COLONOSCOPY WITH PROPOFOL Left 02/22/2015   Procedure: COLONOSCOPY WITH PROPOFOL;  Surgeon: Hulen Luster, MD;  Location: [facility] ENDOSCOPY;  Service: Endoscopy;  Laterality: Left;  . CORONARY ANGIOPLASTY WITH STENT PLACEMENT Left 07/2012   new onset systolic CHF; elevated troponins.  Cardiac catheterization with stenting to LAD; EF 15%.  2D-echo: EF 20-25%.  . ESOPHAGOGASTRODUODENOSCOPY N/A 02/22/2015   Procedure: ESOPHAGOGASTRODUODENOSCOPY (EGD);  Surgeon: Hulen Luster, MD;  Location: [facility] ENDOSCOPY;  Service: Endoscopy;  Laterality: N/A;  . INCISION AND DRAINAGE ABSCESS Right 2007   groin; with ICU stay due to sepsis.  Marland Kitchen LAPAROSCOPIC CHOLECYSTECTOMY  2011  .  LEFT HEART CATH AND CORONARY ANGIOGRAPHY N/A 10/05/2018   Procedure: LEFT HEART CATH AND CORONARY ANGIOGRAPHY;  Surgeon: Minna Merritts, MD;  Location: [place] [facility] CV LAB;  Service: Cardiovascular;  Laterality: N/A;  . LEFT HEART CATHETERIZATION WITH CORONARY ANGIOGRAM N/A 07/21/2012   Procedure: LEFT HEART CATHETERIZATION WITH CORONARY ANGIOGRAM;  Surgeon: Jolaine Artist, MD;  Location[facility] CATH LAB;  Service: Cardiovascular;  Laterality: N/A;  . [place]   L4-5  . PERCUTANEOUS CORONARY STENT INTERVENTION (PCI-S) N/A 07/23/2012   Procedure: PERCUTANEOUS CORONARY STENT INTERVENTION (PCI-S);  Surgeon: Sherren Mocha, MD;  Location: [facility] CATH LAB;  Service: Cardiovascular;  Laterality: N/A;  . PERIPHERAL VASCULAR BALLOON ANGIOPLASTY Left 10/06/2018   Procedure: PERIPHERAL VASCULAR BALLOON ANGIOPLASTY;  Surgeon: Katha Cabal, MD;  Location: [employer] CV LAB;  Service: Cardiovascular;  Laterality: Left;  . PERIPHERAL VASCULAR CATHETERIZATION N/A 02/10/2015   Procedure: Picc Line Insertion;  Surgeon: Katha Cabal,  MD;  Location: [place] CV LAB;  Service: Cardiovascular;  Laterality: N/A;  . TEE WITHOUT CARDIOVERSION N/A 10/02/2018   Procedure: TRANSESOPHAGEAL ECHOCARDIOGRAM (TEE);  Surgeon: Wellington Hampshire, MD;  Location: [facility] ORS;  Service: Cardiovascular;  Laterality: N/A;  . [place]  . VAGINAL HYSTERECTOMY  01/2011   Fibroids/DUB.  Ovaries removed. [place].     Current Meds  Medication Sig  . aspirin EC 81 MG EC tablet Take 1 tablet (81 mg total) by mouth daily.  Marland Kitchen CALCIUM PO Take 1 tablet by mouth 2 (two) times daily.  . carvedilol (COREG) 6.25 MG tablet Take 1 tablet (6.25 mg total) by mouth 2 (two) times daily with a meal.  . ceFAZolin (ANCEF) IVPB Inject 2 g into the vein every 8 (eight) hours. Indication:  MSSA Bacteremia, Tricuspid valve endocarditis/Mitral Valve Vegetation  Last Day of Therapy:  01/22/2019 Labs - Once weekly:   CBC/D and CMP, Labs - Every other week:  ESR and CRP Please leave PICC in place until doctor has seen patient or been notified.  . clopidogrel (PLAVIX) 75 MG tablet Take 1 tablet (75 mg total) by mouth daily.  . Ferrous Sulfate (IRON PO) Take 325 mg by mouth 2 (two) times daily.  Marland Kitchen gabapentin (NEURONTIN) 600 MG tablet Take 1 tablet (600 mg total) by mouth 2 (two) times daily. Takes 1 in the morning and two at bedtime  . glimepiride (AMARYL) 4 MG tablet Take 4 mg by mouth 2 (two) times daily.  Marland Kitchen glucose blood (ONE TOUCH ULTRA TEST) test strip USE AS DIRECTED TO CHECK BLOOD SUGAR 3 TIMES DAILY  . insulin glargine (LANTUS) 100 UNIT/ML injection Inject 0.16 mLs (16 Units total) into the skin at bedtime. (Patient taking differently: Inject 8 Units into the skin at bedtime. )  . losartan (COZAAR) 50 MG tablet Take 0.5 tablets (25 mg total) by mouth daily.  . Multiple Vitamin (MULTIVITAMIN) tablet Take 1 tablet by mouth daily.  . Omega-3 Fatty Acids (FISH OIL PO) Take 1 capsule by mouth daily.  . potassium chloride (K-DUR,KLOR-CON) 10 MEQ tablet Take 1 tablet (10 mEq total) by mouth as needed. On days you take the 3rd torsemide dose  . torsemide (DEMADEX) 20 MG tablet Take 1 tablet (20 mg total) by mouth 2 (two) times daily. And additional 73m if needed for a 3rd dose  . traMADol (ULTRAM) 50 MG tablet Take 1 tablet (50 mg total) by mouth every 6 (six) hours as needed.     Allergies:   Patient has no known allergies.   Social History   Tobacco Use  . Smoking status: Never Smoker  . Smokeless tobacco: Never Used  Substance Use Topics  . Alcohol use: No    Alcohol/week: 0.0 standard drinks  . Drug use: No     Family Hx: The patient's family history includes Arthritis in her mother; COPD in her maternal grandmother; Cancer in her father and maternal grandfather; Cancer (age of onset: 522 in her brother; Diabetes in her maternal grandmother, mother, paternal grandfather, paternal grandmother, and  son; Heart disease in her maternal grandmother; Hypertension in her mother and son; Obesity in her brother.  ROS:   Please see the history of present illness.     All other systems reviewed and are negative.   Labs/Other Tests and Data Reviewed:    Recent Labs: 11/22/2018: B Natriuretic Peptide 2,246.0; TSH 4.684 11/23/2018: Magnesium 1.3 12/24/2018: ALT <5; BUN 24; Creatinine, Ser 1.48; Hemoglobin 8.8; Platelets 265;  Potassium 4.9; Sodium 141   Recent Lipid Panel Lab Results  Component Value Date/Time   CHOL 91 (L) 04/15/2018 03:32 PM   TRIG 137 04/15/2018 03:32 PM   HDL 32 (L) 04/15/2018 03:32 PM   CHOLHDL 2.8 04/15/2018 03:32 PM   CHOLHDL 5.6 (H) 08/06/2016 02:31 PM   LDLCALC 32 04/15/2018 03:32 PM    Wt Readings from Last 3 Encounters:  01/14/19 278 lb (126.1 kg)  01/07/19 269 lb (122 kg)  12/24/18 285 lb (129.3 kg)     Objective:    Vital Signs:  BP (!) 114/56   Ht 5' 8"  (1.727 m)   Wt 278 lb (126.1 kg)   BMI 42.27 kg/m    CONSTITUTIONAL:  Well nourished, well developed female in no acute distress.  EYES: anicteric MOUTH: oral mucosa is pink RESPIRATORY: Normal respiratory effort, symmetric expansion CARDIOVASCULAR: No peripheral edema SKIN: No rash, lesions or ulcers MUSCULOSKELETAL: no digital cyanosis NEURO: Cranial Nerves II-XII grossly intact, moves all extremities PSYCH: Intact judgement and insight.  A&O x 3, Mood/affect appropriate   ASSESSMENT & PLAN:    1.  OSA - the patient is tolerating PAP therapy well without any problems. I will get a PAP download from her DME.  The patient has been using and benefiting from PAP use and will continue to benefit from therapy.   2.  HTN - her BP is controlled today when she checked it.  She will continue on Carvedolol 6/25,g BID and Losartan 54m daily.    3. Morbid Obesity - I have encouraged her to get into a routine exercise program and cut back on carbs and portions.   COVID-19 Education: The signs and  symptoms of COVID-19 were discussed with the patient and how to seek care for testing (follow up with PCP or arrange E-visit).  The importance of social distancing was discussed today.  Patient Risk:   After full review of this patient's clinical status, I feel that they are at least moderate risk at this time.  Time:   Today, I have spent 25 minutes with the patient reviewing chart and discussing medical problems including OSA, obesity and reviewing symptoms of COVID 19 and the ways to protect against contracting the virus with telehealth technology.      Medication Adjustments/Labs and Tests Ordered: Current medicines are reviewed at length with the patient today.  Concerns regarding medicines are outlined above.  Tests Ordered: No orders of the defined types were placed in this encounter.  Medication Changes: No orders of the defined types were placed in this encounter.   Disposition:  Follow up in 1 year(s)  Signed, TFransico Him MD  01/14/2019 2:52 PM    CAmericusThis encounter was created in error - please disregard.

## 2019-01-14 NOTE — Telephone Encounter (Signed)
New Message:   Patient states that some call her this morning . Please call patient back.

## 2019-01-14 NOTE — Telephone Encounter (Addendum)
TELEPHONE CALL NOTE  [name] has been deemed a candidate for a follow-up tele-health visit to limit community exposure during the Covid-19 pandemic. I spoke with the patient via phone to ensure availability of phone/video source, confirm preferred email & phone number, and discuss instructions and expectations.  I reminded [name] to be prepared with any vital sign and/or heart rhythm information that could potentially be obtained via home monitoring, at the time of her visit. I reminded [name] to expect a phone call at the time of her visit if her visit.  She is schedule with Dr. Radford Pax on 01/14/19 at 3 pm.   Did the patient verbally acknowledge consent to treatment? YES  Sarina Ill, RN 01/14/2019 10:25 AM  CONSENT FOR TELE-HEALTH VISIT - PLEASE REVIEW  I hereby voluntarily request, consent and authorize [facility] and its employed or contracted physicians, physician assistants, nurse practitioners or other licensed health care professionals (the Practitioner), to provide me with telemedicine health care services (the "Services") as deemed necessary by the treating Practitioner. I acknowledge and consent to receive the Services by the Practitioner via telemedicine. I understand that the telemedicine visit will involve communicating with the Practitioner through live audiovisual communication technology and the disclosure of certain medical information by electronic transmission. I acknowledge that I have been given the opportunity to request an in-person assessment or other available alternative prior to the telemedicine visit and am voluntarily participating in the telemedicine visit.  I understand that I have the right to withhold or withdraw my consent to the use of telemedicine in the course of my care at any time, without affecting my right to future care or treatment, and that the Practitioner or I may terminate the telemedicine visit at any time. I  understand that I have the right to inspect all information obtained and/or recorded in the course of the telemedicine visit and may receive copies of available information for a reasonable fee.  I understand that some of the potential risks of receiving the Services via telemedicine include:  Marland Kitchen Delay or interruption in medical evaluation due to technological equipment failure or disruption; . Information transmitted may not be sufficient (e.g. poor resolution of images) to allow for appropriate medical decision making by the Practitioner; and/or  . In rare instances, security protocols could fail, causing a breach of personal health information.  Furthermore, I acknowledge that it is my responsibility to provide information about my medical history, conditions and care that is complete and accurate to the best of my ability. I acknowledge that Practitioner's advice, recommendations, and/or decision may be based on factors not within their control, such as incomplete or inaccurate data provided by me or distortions of diagnostic images or specimens that may result from electronic transmissions. I understand that the practice of medicine is not an exact science and that Practitioner makes no warranties or guarantees regarding treatment outcomes. I acknowledge that I will receive a copy of this consent concurrently upon execution via email to the email address I last provided but may also request a printed copy by calling the office of [facility].    I understand that my insurance will be billed for this visit.   I have read or had this consent read to me. . I understand the contents of this consent, which adequately explains the benefits and risks of the Services being provided via telemedicine.  . I have been provided ample opportunity to ask questions regarding this consent  and the Services and have had my questions answered to my satisfaction. . I give my informed consent for the services to be  provided through the use of telemedicine in my medical care  By participating in this telemedicine visit I agree to the above.

## 2019-01-15 ENCOUNTER — Telehealth: Payer: Self-pay | Admitting: Internal Medicine

## 2019-01-15 NOTE — Telephone Encounter (Signed)
lmov to schedule echo and fu with klein in June

## 2019-01-19 ENCOUNTER — Telehealth: Payer: Self-pay | Admitting: Family

## 2019-01-19 ENCOUNTER — Ambulatory Visit: Payer: PPO | Admitting: Physician Assistant

## 2019-01-19 ENCOUNTER — Other Ambulatory Visit: Payer: Self-pay | Admitting: Family Medicine

## 2019-01-19 DIAGNOSIS — I38 Endocarditis, valve unspecified: Secondary | ICD-10-CM | POA: Diagnosis not present

## 2019-01-19 MED ORDER — METOLAZONE 2.5 MG PO TABS: 2.5000 mg | ORAL_TABLET | Freq: Every day | ORAL | 0 refills | Status: DC

## 2019-01-19 NOTE — Telephone Encounter (Signed)
Received phone call from [facility], nurse from [place] regarding patient's weight. Cinda Quest says that patient says that she's gained 16 pounds in the last 4 days. Cinda Quest says that patient has 2+ pedal edema but that her lungs are clear and patient is not complaining of being short of breath. BP today was 110/80.  Has been taking the third dose of torsemide for the last few days along with her potassium. Cinda Quest says that she drew lab work on patient today and will fax results to [place].   Will send in 2 days of metolazone for patient to take daily along with her potassium daily for the next 2 days. Advised Malinda to tell patient to take it 1/2 hour prior to a torsemide dose if possible. Also advised her to have patient contact us in a couple of days to update on weight/ swelling.

## 2019-01-20 ENCOUNTER — Other Ambulatory Visit: Payer: Self-pay

## 2019-01-20 ENCOUNTER — Telehealth: Payer: Self-pay | Admitting: Licensed Clinical Social Worker

## 2019-01-20 ENCOUNTER — Encounter: Payer: Self-pay | Admitting: Infectious Diseases

## 2019-01-20 ENCOUNTER — Encounter: Payer: Self-pay | Admitting: Cardiology

## 2019-01-20 ENCOUNTER — Telehealth (INDEPENDENT_AMBULATORY_CARE_PROVIDER_SITE_OTHER): Payer: PPO | Admitting: Cardiology

## 2019-01-20 VITALS — BP 123/70 | Ht 68.0 in | Wt 295.0 lb

## 2019-01-20 DIAGNOSIS — I38 Endocarditis, valve unspecified: Secondary | ICD-10-CM | POA: Diagnosis not present

## 2019-01-20 DIAGNOSIS — I10 Essential (primary) hypertension: Secondary | ICD-10-CM | POA: Diagnosis not present

## 2019-01-20 DIAGNOSIS — G4733 Obstructive sleep apnea (adult) (pediatric): Secondary | ICD-10-CM | POA: Diagnosis not present

## 2019-01-20 DIAGNOSIS — Z7189 Other specified counseling: Secondary | ICD-10-CM | POA: Diagnosis not present

## 2019-01-20 NOTE — Telephone Encounter (Signed)
thanks

## 2019-01-20 NOTE — Telephone Encounter (Signed)
Amy RN called to report the patient had a fully  occluded PICC line and would need to use a lot of Cathflow to try to clear it.  Patient is due to end her antibiotics on 4/17. Per Dr. Delaine Lame order given to RN to give patient a peripheral IV to use for the next 2 days and then remove the PICC and the IV. RN expressed understanding and the line will be placed today.

## 2019-01-20 NOTE — Progress Notes (Signed)
Virtual Visit via Video Note   This visit type was conducted due to national recommendations for restrictions regarding the COVID-19 Pandemic (e.g. social distancing) in an effort to limit this patient's exposure and mitigate transmission in our community.  Due to her co-morbid illnesses, this patient is at least at moderate risk for complications without adequate follow up.  This format is felt to be most appropriate for this patient at this time.  All issues noted in this document were discussed and addressed.  A limited physical exam was performed with this format.  Please refer to the patient's chart for her consent to telehealth for [facility].  Evaluation Performed:  Follow-up visit  This visit type was conducted due to national recommendations for restrictions regarding the COVID-19 Pandemic (e.g. social distancing).  This format is felt to be most appropriate for this patient at this time.  All issues noted in this document were discussed and addressed.  No physical exam was performed (except for noted visual exam findings with Video Visits).  Please refer to the patient's chart (MyChart message for video visits and phone note for telephone visits) for the patient's consent to telehealth for [facility].  Date:  01/20/2019   ID:  [name], DOB [birth year], MRN [id number]  Patient Location:  Home  Provider location:   [place]  PCP:  Rutherford Guys, MD  Cardiologist:  Ida Rogue, MD  Sleep Medicine:  Fransico Him, MD Electrophysiologist:  None   Chief Complaint:  OSA  History of Present Illness:    [name] is a 63 y.o. female who presents via audio/video conferencing for a telehealth visit today.    This is a 63yo female with a history of ASCAD, CKD, chronic systolic CHF and Moderate OSA with AHI 23/hr and now on CPAP at 16cm H2O.  PSG showed moderate OSA with an overall AHI of 23 events per hour and 87 events per hour during REM sleep.  She had  reduced sleep efficiency with increased frequency of arousals from respiratory events and moderate snoring.  She underwent CPAP titration to 16cm H2O. Her DME is [facility].  She is here to reestablish sleep care.  She is needing new supplies.    She tells me that she has not used her CPAP in 2 years because the full face mask that she wore eroded  Through the bridge of her nose and she had to go to the wound clinic.  SHe is very tired during the day and when she wakes up and sleeps poorly at night.  She know that she needs to get back on CPAP and her husband tells her that her snoring is very bad.    The patient does not have symptoms concerning for COVID-19 infection (fever, chills, cough, or new shortness of breath).    Prior CV studies:   The following studies were reviewed today:  Sleep study 2016  Past Medical History:  Diagnosis Date   Acute myocardial infarction, subendocardial infarction, initial episode of care ([place]) 07/21/2012   Acute osteomyelitis involving ankle and foot ([place]) 11/11/3662   Acute systolic heart failure ([place]) 07/21/2012   New onset 07/19/12; admission to [facility] ED. Elevated Troponins.  S/p 2D-echo with EF 20-25%.  S/p cardiac catheterization with stenting LAD.  Repeat 2D-echo 10/2011 with improved EF of 35%.    Anemia    Automatic implantable cardioverter-defibrillator in situ    a. MDT CRT-D 06/2014, SN: [id number]     CAD (coronary  artery disease)    a. cardiac cath 101/04/2012: PCI/DES to chronically occluded mLAD, consideration PCI to diag branch in 4 weeks.    Cataract    Chicken pox    Chronic kidney disease    Chronic systolic CHF (congestive heart failure) (HCC)    a. mixed ICM & NICM; b. EF 20-25% by echo 07/2012, mid-dist 2/3 of LV sev HK/AK, mild MR. echo 10/2012: EF 30-35%, sev HK ant-septal & inf walls, GR1DD, mild MR, PASP 33. c. echo 02/2013: EF 30%, GR1DD, mild MR. echo 04/2014: EF 30%, Septal-lat dyssynchrony, global HK, inf AK, GR1DD, mild MR.  d. echo 10/2014: EF50-55%, WM nl, GR1DD, septal mild paradox. e. echo 02/2015: EF 50-55%, wm    Depression    Heart attack ([place])    Heel ulcer ([place]) 04/27/2015   History of blood transfusion ~ 2011   "plasma; had neuropathy; couldn't walk"   Hypertension    LBBB (left bundle branch block)    Neuromuscular disorder ([place])    Neuropathy 2011   Obesity, unspecified    OSA on CPAP    Moderate with AHI 23/hr and now on CPAP at 16cm H2O.  Her DME is [facility]   Pure hypercholesterolemia    Type II diabetes mellitus ([place])    Unspecified vitamin D deficiency    Past Surgical History:  Procedure Laterality Date   AMPUTATION TOE Right 06/18/2015   Procedure: AMPUTATION TOE;  Surgeon: Samara Deist, DPM;  Location: [facility] ORS;  Service: Podiatry;  Laterality: Right;   AMPUTATION TOE Left 10/08/2018   Procedure: AMPUTATION TOE LEFT 2ND;  Surgeon: Albertine Patricia, DPM;  Location: [facility] ORS;  Service: Podiatry;  Laterality: Left;   BACK SURGERY     BI-VENTRICULAR IMPLANTABLE CARDIOVERTER DEFIBRILLATOR N/A 07/06/2014   Procedure: BI-VENTRICULAR IMPLANTABLE CARDIOVERTER DEFIBRILLATOR  (CRT-D);  Surgeon: Deboraha Sprang, MD;  Location: [facility] CATH LAB;  Service: Cardiovascular;  Laterality: N/A;   BI-VENTRICULAR IMPLANTABLE CARDIOVERTER DEFIBRILLATOR  (CRT-D)  07/06/2014   BILATERAL OOPHORECTOMY  01/2011   ovarian cyst benign   COLONOSCOPY WITH PROPOFOL Left 02/22/2015   Procedure: COLONOSCOPY WITH PROPOFOL;  Surgeon: Hulen Luster, MD;  Location: [facility] ENDOSCOPY;  Service: Endoscopy;  Laterality: Left;   CORONARY ANGIOPLASTY WITH STENT PLACEMENT Left 07/2012   new onset systolic CHF; elevated troponins.  Cardiac catheterization with stenting to LAD; EF 15%.  2D-echo: EF 20-25%.   ESOPHAGOGASTRODUODENOSCOPY N/A 02/22/2015   Procedure: ESOPHAGOGASTRODUODENOSCOPY (EGD);  Surgeon: Hulen Luster, MD;  Location: [facility] ENDOSCOPY;  Service: Endoscopy;  Laterality: N/A;   INCISION AND DRAINAGE ABSCESS Right 2007    groin; with ICU stay due to sepsis.   LAPAROSCOPIC CHOLECYSTECTOMY  2011   LEFT HEART CATH AND CORONARY ANGIOGRAPHY N/A 10/05/2018   Procedure: LEFT HEART CATH AND CORONARY ANGIOGRAPHY;  Surgeon: Minna Merritts, MD;  Location: [place] CV LAB;  Service: Cardiovascular;  Laterality: N/A;   LEFT HEART CATHETERIZATION WITH CORONARY ANGIOGRAM N/A 07/21/2012   Procedure: LEFT HEART CATHETERIZATION WITH CORONARY ANGIOGRAM;  Surgeon: Jolaine Artist, MD;  Location: [facility] CATH LAB;  Service: Cardiovascular;  Laterality: N/A;   LUMBAR DISC SURGERY  1996   L4-5   PERCUTANEOUS CORONARY STENT INTERVENTION (PCI-S) N/A 07/23/2012   Procedure: PERCUTANEOUS CORONARY STENT INTERVENTION (PCI-S);  Surgeon: Sherren Mocha, MD;  Location: [facility] CATH LAB;  Service: Cardiovascular;  Laterality: N/A;   PERIPHERAL VASCULAR BALLOON ANGIOPLASTY Left 10/06/2018   Procedure: PERIPHERAL VASCULAR BALLOON ANGIOPLASTY;  Surgeon: Katha Cabal, MD;  Location: [place] INVASIVE CV  LAB;  Service: Cardiovascular;  Laterality: Left;   PERIPHERAL VASCULAR CATHETERIZATION N/A 02/10/2015   Procedure: Picc Line Insertion;  Surgeon: Katha Cabal, MD;  Location: [place] CV LAB;  Service: Cardiovascular;  Laterality: N/A;   TEE WITHOUT CARDIOVERSION N/A 10/02/2018   Procedure: TRANSESOPHAGEAL ECHOCARDIOGRAM (TEE);  Surgeon: Wellington Hampshire, MD;  Location: [facility] ORS;  Service: Cardiovascular;  Laterality: N/A;   [place]  01/2011   Fibroids/DUB.  Ovaries removed. [place].     Current Meds  Medication Sig   aspirin EC 81 MG EC tablet Take 1 tablet (81 mg total) by mouth daily.   CALCIUM PO Take 1 tablet by mouth 2 (two) times daily.   carvedilol (COREG) 6.25 MG tablet Take 1 tablet (6.25 mg total) by mouth 2 (two) times daily with a meal.   ceFAZolin (ANCEF) IVPB Inject 2 g into the vein every 8 (eight) hours. Indication:  MSSA Bacteremia, Tricuspid valve  endocarditis/Mitral Valve Vegetation  Last Day of Therapy:  01/22/2019 Labs - Once weekly:  CBC/D and CMP, Labs - Every other week:  ESR and CRP Please leave PICC in place until doctor has seen patient or been notified.   clopidogrel (PLAVIX) 75 MG tablet Take 1 tablet (75 mg total) by mouth daily.   Ferrous Sulfate (IRON PO) Take 325 mg by mouth 2 (two) times daily.   gabapentin (NEURONTIN) 600 MG tablet Take 1 tablet (600 mg total) by mouth 2 (two) times daily. Takes 1 in the morning and two at bedtime   glimepiride (AMARYL) 4 MG tablet Take 4 mg by mouth 2 (two) times daily.   glucose blood (ONE TOUCH ULTRA TEST) test strip USE AS DIRECTED TO CHECK BLOOD SUGAR 3 TIMES DAILY   insulin glargine (LANTUS) 100 UNIT/ML injection Inject 0.16 mLs (16 Units total) into the skin at bedtime.   losartan (COZAAR) 50 MG tablet Take 0.5 tablets (25 mg total) by mouth daily.   metolazone (ZAROXOLYN) 2.5 MG tablet Take 1 tablet (2.5 mg total) by mouth daily. Take 1/2 hour before a torsemide dose   Multiple Vitamin (MULTIVITAMIN) tablet Take 1 tablet by mouth daily.   Omega-3 Fatty Acids (FISH OIL PO) Take 1 capsule by mouth daily.   potassium chloride (K-DUR,KLOR-CON) 10 MEQ tablet Take 1 tablet (10 mEq total) by mouth as needed. On days you take the 3rd torsemide dose   torsemide (DEMADEX) 20 MG tablet Take 1 tablet (20 mg total) by mouth 2 (two) times daily. And additional 20m if needed for a 3rd dose   traMADol (ULTRAM) 50 MG tablet Take 1 tablet (50 mg total) by mouth every 6 (six) hours as needed.     Allergies:   Patient has no known allergies.   Social History   Tobacco Use   Smoking status: Never Smoker   Smokeless tobacco: Never Used  Substance Use Topics   Alcohol use: No    Alcohol/week: 0.0 standard drinks   Drug use: No     Family Hx: The patient's family history includes Arthritis in her mother; COPD in her maternal grandmother; Cancer in her father and maternal  grandfather; Cancer (age of onset: 589 in her brother; Diabetes in her maternal grandmother, mother, paternal grandfather, paternal grandmother, and son; Heart disease in her maternal grandmother; Hypertension in her mother and son; Obesity in her brother.  ROS:   Please see the history of present illness.     All other systems reviewed and  are negative.   Labs/Other Tests and Data Reviewed:    Recent Labs: 11/22/2018: B Natriuretic Peptide 2,246.0; TSH 4.684 11/23/2018: Magnesium 1.3 12/24/2018: ALT <5; BUN 24; Creatinine, Ser 1.48; Hemoglobin 8.8; Platelets 265; Potassium 4.9; Sodium 141   Recent Lipid Panel Lab Results  Component Value Date/Time   CHOL 91 (L) 04/15/2018 03:32 PM   TRIG 137 04/15/2018 03:32 PM   HDL 32 (L) 04/15/2018 03:32 PM   CHOLHDL 2.8 04/15/2018 03:32 PM   CHOLHDL 5.6 (H) 08/06/2016 02:31 PM   LDLCALC 32 04/15/2018 03:32 PM    Wt Readings from Last 3 Encounters:  01/20/19 295 lb (133.8 kg)  01/14/19 278 lb (126.1 kg)  01/07/19 269 lb (122 kg)     Objective:    Vital Signs:  BP 123/70    Ht _0  (1.727 m)    Wt 295 lb (133.8 kg)    BMI 44.85 kg/m    CONSTITUTIONAL:  Well nourished, well developed female in no acute distress.  EYES: anicteric MOUTH: oral mucosa is pink RESPIRATORY: Normal respiratory effort, symmetric expansion CARDIOVASCULAR: No peripheral edema SKIN: No rash, lesions or ulcers MUSCULOSKELETAL: no digital cyanosis NEURO: Cranial Nerves II-XII grossly intact, moves all extremities PSYCH: Intact judgement and insight.  A&O x 3, Mood/affect appropriate   ASSESSMENT & PLAN:    1.  OSA - she has a history of moderate OSA with an overall AHI of 23 events per hour and 87 events per hour during REM sleep by sleep study in 2016.  She had reduced sleep efficiency with increased frequency of arousals from respiratory events and moderate snoring.  She underwent CPAP titration to 16cm H2O.  Stopped wearing CPAP a 2 years ago due to erosion on  the bridge of her nose requiring wound care.  She needs to get back on PAP therapy due to severe daytime sleepiness.  She still has her CPAP unit.  I will order her a Resp[employer]ics DreamWear under the nose full face mask and PAP supplies.  I will place her device on auto from 4 to 18cm H2O and get a download in 2 weeks.  SHe will followup with me in 3 months.  If she has any problem with the mask or pressure in the mean time she will call me.    2.  Hypertension - her BP is controlled on exam today.  SHe will continue on Carvedilol 6.52m BID and Losartan 225mdaily.  3.  Obesity - She has a lot of fatigue right now that has limited her ability to exercise.   4.  COVID-19 Education:The signs and symptoms of COVID-19 were discussed with the patient and how to seek care for testing (follow up with PCP or arrange E-visit).  The importance of social distancing was discussed today.  Patient Risk:   After full review of this patient's clinical status, I feel that they are at least moderate risk at this time.  Time:   Today, I have spent 25 minutes with the patient reviewing chart and discussing medical problems including OSA, HTN, obesity and reviewing symptoms of COVID 19 and the ways to protect against contracting the virus with telehealth technology.      Medication Adjustments/Labs and Tests Ordered: Current medicines are reviewed at length with the patient today.  Concerns regarding medicines are outlined above.  Tests Ordered: No orders of the defined types were placed in this encounter.  Medication Changes: No orders of the defined types were placed in this encounter.  Disposition:  Follow up in 3 month(s)  Signed, Fransico Him, MD  01/20/2019 4:53 PM    [facility]

## 2019-01-20 NOTE — Patient Instructions (Signed)
Medication Instructions:  Your physician recommends that you continue on your current medications as directed. Please refer to the Current Medication list given to you today. If you need a refill on your cardiac medications before your next appointment, please call your pharmacy.   Lab work: None If you have labs (blood work) drawn today and your tests are completely normal, you will receive your results only by: Marland Kitchen MyChart Message (if you have MyChart) OR . A paper copy in the mail If you have any lab test that is abnormal or we need to change your treatment, we will call you to review the results.  Testing/Procedures: None  Follow-Up: Your physician recommends that you schedule a follow-up appointment in: 3 months with Dr. Radford Pax.

## 2019-01-20 NOTE — Telephone Encounter (Signed)
Correction to the above note, the PICC line will be removed today once peripheral IV is placed. Order placed to Carlota Raspberry, RN.

## 2019-01-20 NOTE — Telephone Encounter (Signed)
Verbal orders given to [facility] with [facility] for patient to have blood cultures on 01/27/2019 and to leave PICC in until results are back from culture. IV medication still to end on 01/22/19.

## 2019-01-21 ENCOUNTER — Other Ambulatory Visit: Payer: Self-pay | Admitting: Family

## 2019-01-21 ENCOUNTER — Telehealth: Payer: Self-pay | Admitting: *Deleted

## 2019-01-21 ENCOUNTER — Telehealth: Payer: Self-pay | Admitting: Family

## 2019-01-21 ENCOUNTER — Telehealth: Payer: Self-pay | Admitting: Licensed Clinical Social Worker

## 2019-01-21 DIAGNOSIS — I5023 Acute on chronic systolic (congestive) heart failure: Secondary | ICD-10-CM

## 2019-01-21 DIAGNOSIS — G4733 Obstructive sleep apnea (adult) (pediatric): Secondary | ICD-10-CM

## 2019-01-21 NOTE — Telephone Encounter (Signed)
Patient called saying that she's gained 4 pounds overnight and her swelling is getting worse in her legs where she's having some weeping of fluids. She also feels more short of breath than usual. She took the 2.5mg  metolazone yesterday and again this morning but without any relief and she says that she really isn't urinating much more than she normally does. Patient currently has a PICC line in place.   [place] and spoke to her nurse, Caryl Pina, about whether we can get the IV furosemide to the patient and [facility] give it in the home. With COVID-19, would much prefer for patient to be treated in the home then bring her to the hospital to receive IV lasix at same day surgery. Caryl Pina says that they can give it and she will have to call and order it and she's not sure if patient will receive it at her home today or tomorrow. Order given for one dose of 40mg  IV lasix to be given. Asked Caryl Pina to call me back if she finds out that patient can not get the IV lasix delivered in time for her to receive it tomorrow as I didn't want her to go through the weekend getting worse (tomorrow is Friday). Caryl Pina says she will call me back if they won't be able to give it tomorrow.  Called patient back and reviewed my discussion with Caryl Pina and patient was comfortable with this plan. Explained that if [facility] couldn't get the IV lasix delivered in time that I would need patient to come to the hospital to receive the IV lasix and patient says that she would have a ride tomorrow to do that if needed. Also advised her that should her weight continue to rise, edema / shortness of breath worsen even after we give the IV lasix, that she should present to the ED and patient verbalized understanding of that as well.

## 2019-01-21 NOTE — Telephone Encounter (Signed)
Order placed today to [facility]

## 2019-01-21 NOTE — Telephone Encounter (Signed)
Spoke to patient today and she did not have PICC line removed, RN was able to unclog it with Cathflow. She is now giving herself her IV antibiotics again. She did miss two doses but she will continue until her medication is finished.

## 2019-01-21 NOTE — Telephone Encounter (Signed)
[facility] is unable to obtain/ supply furosemide for IV use. Patient is scheduled to receive 80mg  IV lasix / 40 meq PO potassium tomorrow at 9am at same day surgery. Orders have been placed and patient was informed.

## 2019-01-21 NOTE — Telephone Encounter (Signed)
Thanks

## 2019-01-21 NOTE — Telephone Encounter (Signed)
-----   Message from Sarina Ill, RN sent at 01/20/2019  5:17 PM EDT ----- Regarding: Sleep Dr. Radford Pax ordered a mask and supplies for the patient, please send. Thanks, [employer]

## 2019-01-22 ENCOUNTER — Telehealth: Payer: Self-pay | Admitting: Infectious Diseases

## 2019-01-22 ENCOUNTER — Other Ambulatory Visit: Payer: Self-pay

## 2019-01-22 ENCOUNTER — Ambulatory Visit
Admission: RE | Admit: 2019-01-22 | Discharge: 2019-01-22 | Disposition: A | Discharge status: Home / Self Care | Payer: PPO | Source: Ambulatory Visit | Attending: Family | Admitting: Family

## 2019-01-22 DIAGNOSIS — I5023 Acute on chronic systolic (congestive) heart failure: Secondary | ICD-10-CM | POA: Diagnosis not present

## 2019-01-22 HISTORY — DX: Sepsis, unspecified organism: A41.9

## 2019-01-22 LAB — BASIC METABOLIC PANEL
Anion gap: 9 (ref 5–15)
BUN: 73 mg/dL — ABNORMAL HIGH (ref 8–23)
CO2: 24 mmol/L (ref 22–32)
Calcium: 8.6 mg/dL — ABNORMAL LOW (ref 8.9–10.3)
Chloride: 101 mmol/L (ref 98–111)
Creatinine, Ser: 1.71 mg/dL — ABNORMAL HIGH (ref 0.44–1.00)
GFR calc Af Amer: 36 mL/min — ABNORMAL LOW (ref >60)
GFR calc non Af Amer: 31 mL/min — ABNORMAL LOW (ref >60)
Glucose, Bld: 116 mg/dL — ABNORMAL HIGH (ref 70–99)
Potassium: 4.8 mmol/L (ref 3.5–5.1)
Sodium: 134 mmol/L — ABNORMAL LOW (ref 135–145)

## 2019-01-22 LAB — BRAIN NATRIURETIC PEPTIDE: B Natriuretic Peptide: 1208 pg/mL — ABNORMAL HIGH (ref 0.0–100.0)

## 2019-01-22 LAB — GLUCOSE, CAPILLARY: Glucose-Capillary: 101 mg/dL — ABNORMAL HIGH (ref 70–99)

## 2019-01-22 MED ORDER — HEPARIN SOD (PORK) LOCK FLUSH 10 UNIT/ML IV SOLN
10.0000 [IU] | Freq: Once | INTRAVENOUS | Status: DC
  Filled 2019-01-22: qty 1

## 2019-01-22 MED ORDER — SODIUM CHLORIDE FLUSH 0.9 % IV SOLN
INTRAVENOUS | Status: AC
  Administered 2019-01-22: 10:00:00 10 mL via INTRAVENOUS
  Filled 2019-01-22: qty 20

## 2019-01-22 MED ORDER — FUROSEMIDE 10 MG/ML IJ SOLN
80.0000 mg | Freq: Once | INTRAMUSCULAR | Status: AC
  Administered 2019-01-22: 80 mg via INTRAVENOUS

## 2019-01-22 MED ORDER — FUROSEMIDE 10 MG/ML IJ SOLN
INTRAMUSCULAR | Status: AC
  Administered 2019-01-22: 10:00:00 80 mg via INTRAVENOUS
  Filled 2019-01-22: qty 8

## 2019-01-22 MED ORDER — POTASSIUM CHLORIDE CRYS ER 20 MEQ PO TBCR
40.0000 meq | EXTENDED_RELEASE_TABLET | Freq: Once | ORAL | Status: AC
  Administered 2019-01-22: 40 meq via ORAL

## 2019-01-22 MED ORDER — POTASSIUM CHLORIDE CRYS ER 20 MEQ PO TBCR
EXTENDED_RELEASE_TABLET | ORAL | Status: AC
  Filled 2019-01-22: qty 2

## 2019-01-22 MED ORDER — SODIUM CHLORIDE 0.9% FLUSH
10.0000 mL | Freq: Two times a day (BID) | INTRAVENOUS | Status: AC
  Administered 2019-01-22 (×2): 10 mL via INTRAVENOUS

## 2019-01-22 NOTE — Telephone Encounter (Signed)
Got a call from Home health that blood culturesent yesterday had gram positive cocci- on talking to them I found out that they had taken the culture from the PICC line that was clotted and had to be opened up with activase- likely a contaminant- I tried calling the patient to see how she was doing. As per the home nurse pt was fine and she ready [place]. She did not have any fever or chills She completed cefazolin and PICC has been removed. The nurse will do a rpeeat culture from the periphery next week.She will contact the patient over the weekend-Told her to let the patient know to go to ED if she gets sick I could not reach the patient.myself as she was traveling

## 2019-01-22 NOTE — OR Nursing (Addendum)
Patient arrived via wheelchair. States that she has put on 30 pounds in last 2 months and has difficulty walking. Ankles very swollen, 2+ pitting edema. Face/eyes appear to be swollen also. Patient states that this is since the extra fluid and weight gain.  Denies pain or SOB. Reviewed reasons for patient to seek care. Has no questions and has no need to void.  Discharge via wheelchair to her husband.

## 2019-01-22 NOTE — Discharge Instructions (Signed)
Patient to follow up with [facility] or cardiologist if any difficulties over the weekend.  If breathing becomes labored or very difficult then patient to go to Emergency Room.

## 2019-01-25 ENCOUNTER — Emergency Department: Payer: PPO

## 2019-01-25 ENCOUNTER — Telehealth: Payer: Self-pay | Admitting: Family

## 2019-01-25 ENCOUNTER — Encounter: Payer: Self-pay | Admitting: Emergency Medicine

## 2019-01-25 ENCOUNTER — Emergency Department
Admission: EM | Admit: 2019-01-25 | Discharge: 2019-01-25 | Disposition: A | Discharge status: Home / Self Care | Payer: PPO | Attending: Emergency Medicine | Admitting: Emergency Medicine

## 2019-01-25 ENCOUNTER — Telehealth: Payer: Self-pay | Admitting: Licensed Clinical Social Worker

## 2019-01-25 ENCOUNTER — Encounter: Payer: PPO | Admitting: *Deleted

## 2019-01-25 ENCOUNTER — Other Ambulatory Visit: Payer: Self-pay

## 2019-01-25 DIAGNOSIS — E1122 Type 2 diabetes mellitus with diabetic chronic kidney disease: Secondary | ICD-10-CM | POA: Diagnosis not present

## 2019-01-25 DIAGNOSIS — Z955 Presence of coronary angioplasty implant and graft: Secondary | ICD-10-CM | POA: Diagnosis not present

## 2019-01-25 DIAGNOSIS — Z794 Long term (current) use of insulin: Secondary | ICD-10-CM | POA: Insufficient documentation

## 2019-01-25 DIAGNOSIS — I330 Acute and subacute infective endocarditis: Secondary | ICD-10-CM | POA: Diagnosis not present

## 2019-01-25 DIAGNOSIS — Z95820 Peripheral vascular angioplasty status with implants and grafts: Secondary | ICD-10-CM | POA: Diagnosis not present

## 2019-01-25 DIAGNOSIS — I509 Heart failure, unspecified: Secondary | ICD-10-CM

## 2019-01-25 DIAGNOSIS — E559 Vitamin D deficiency, unspecified: Secondary | ICD-10-CM | POA: Diagnosis not present

## 2019-01-25 DIAGNOSIS — R7881 Bacteremia: Secondary | ICD-10-CM | POA: Diagnosis not present

## 2019-01-25 DIAGNOSIS — Z7982 Long term (current) use of aspirin: Secondary | ICD-10-CM | POA: Diagnosis not present

## 2019-01-25 DIAGNOSIS — G4733 Obstructive sleep apnea (adult) (pediatric): Secondary | ICD-10-CM | POA: Diagnosis not present

## 2019-01-25 DIAGNOSIS — E119 Type 2 diabetes mellitus without complications: Secondary | ICD-10-CM | POA: Insufficient documentation

## 2019-01-25 DIAGNOSIS — Z79899 Other long term (current) drug therapy: Secondary | ICD-10-CM | POA: Diagnosis not present

## 2019-01-25 DIAGNOSIS — I447 Left bundle-branch block, unspecified: Secondary | ICD-10-CM | POA: Diagnosis not present

## 2019-01-25 DIAGNOSIS — N189 Chronic kidney disease, unspecified: Secondary | ICD-10-CM | POA: Diagnosis not present

## 2019-01-25 DIAGNOSIS — I5023 Acute on chronic systolic (congestive) heart failure: Secondary | ICD-10-CM | POA: Diagnosis not present

## 2019-01-25 DIAGNOSIS — Z8631 Personal history of diabetic foot ulcer: Secondary | ICD-10-CM | POA: Diagnosis not present

## 2019-01-25 DIAGNOSIS — Z48812 Encounter for surgical aftercare following surgery on the circulatory system: Secondary | ICD-10-CM | POA: Diagnosis not present

## 2019-01-25 DIAGNOSIS — I252 Old myocardial infarction: Secondary | ICD-10-CM | POA: Diagnosis not present

## 2019-01-25 DIAGNOSIS — I130 Hypertensive heart and chronic kidney disease with heart failure and stage 1 through stage 4 chronic kidney disease, or unspecified chronic kidney disease: Secondary | ICD-10-CM | POA: Diagnosis not present

## 2019-01-25 DIAGNOSIS — Z87440 Personal history of urinary (tract) infections: Secondary | ICD-10-CM | POA: Diagnosis not present

## 2019-01-25 DIAGNOSIS — I110 Hypertensive heart disease with heart failure: Secondary | ICD-10-CM | POA: Diagnosis not present

## 2019-01-25 DIAGNOSIS — I2690 Septic pulmonary embolism without acute cor pulmonale: Secondary | ICD-10-CM | POA: Diagnosis not present

## 2019-01-25 DIAGNOSIS — Z89421 Acquired absence of other right toe(s): Secondary | ICD-10-CM | POA: Diagnosis not present

## 2019-01-25 DIAGNOSIS — Z950 Presence of cardiac pacemaker: Secondary | ICD-10-CM | POA: Diagnosis not present

## 2019-01-25 DIAGNOSIS — E1142 Type 2 diabetes mellitus with diabetic polyneuropathy: Secondary | ICD-10-CM | POA: Diagnosis not present

## 2019-01-25 DIAGNOSIS — I5022 Chronic systolic (congestive) heart failure: Secondary | ICD-10-CM | POA: Diagnosis not present

## 2019-01-25 DIAGNOSIS — R0602 Shortness of breath: Secondary | ICD-10-CM | POA: Diagnosis not present

## 2019-01-25 DIAGNOSIS — F329 Major depressive disorder, single episode, unspecified: Secondary | ICD-10-CM | POA: Diagnosis not present

## 2019-01-25 DIAGNOSIS — Z89422 Acquired absence of other left toe(s): Secondary | ICD-10-CM | POA: Diagnosis not present

## 2019-01-25 DIAGNOSIS — D509 Iron deficiency anemia, unspecified: Secondary | ICD-10-CM | POA: Diagnosis not present

## 2019-01-25 DIAGNOSIS — I2510 Atherosclerotic heart disease of native coronary artery without angina pectoris: Secondary | ICD-10-CM | POA: Diagnosis not present

## 2019-01-25 DIAGNOSIS — B9561 Methicillin susceptible Staphylococcus aureus infection as the cause of diseases classified elsewhere: Secondary | ICD-10-CM | POA: Diagnosis not present

## 2019-01-25 DIAGNOSIS — I255 Ischemic cardiomyopathy: Secondary | ICD-10-CM | POA: Diagnosis not present

## 2019-01-25 DIAGNOSIS — Z452 Encounter for adjustment and management of vascular access device: Secondary | ICD-10-CM | POA: Diagnosis not present

## 2019-01-25 DIAGNOSIS — Z5181 Encounter for therapeutic drug level monitoring: Secondary | ICD-10-CM | POA: Diagnosis not present

## 2019-01-25 DIAGNOSIS — Z6841 Body Mass Index (BMI) 40.0 and over, adult: Secondary | ICD-10-CM | POA: Diagnosis not present

## 2019-01-25 DIAGNOSIS — T827XXA Infection and inflammatory reaction due to other cardiac and vascular devices, implants and grafts, initial encounter: Secondary | ICD-10-CM | POA: Diagnosis not present

## 2019-01-25 DIAGNOSIS — R238 Other skin changes: Secondary | ICD-10-CM | POA: Diagnosis not present

## 2019-01-25 LAB — CBC
HCT: 30.8 % — ABNORMAL LOW (ref 36.0–46.0)
Hemoglobin: 9.6 g/dL — ABNORMAL LOW (ref 12.0–15.0)
MCH: 28.2 pg (ref 26.0–34.0)
MCHC: 31.2 g/dL (ref 30.0–36.0)
MCV: 90.6 fL (ref 80.0–100.0)
Platelets: 203 10*3/uL (ref 150–400)
RBC: 3.4 MIL/uL — ABNORMAL LOW (ref 3.87–5.11)
RDW: 14.5 % (ref 11.5–15.5)
WBC: 6.1 10*3/uL (ref 4.0–10.5)
nRBC: 0 % (ref 0.0–0.2)

## 2019-01-25 LAB — BASIC METABOLIC PANEL
Anion gap: 13 (ref 5–15)
BUN: 85 mg/dL — ABNORMAL HIGH (ref 8–23)
CO2: 23 mmol/L (ref 22–32)
Calcium: 8.8 mg/dL — ABNORMAL LOW (ref 8.9–10.3)
Chloride: 96 mmol/L — ABNORMAL LOW (ref 98–111)
Creatinine, Ser: 1.69 mg/dL — ABNORMAL HIGH (ref 0.44–1.00)
GFR calc Af Amer: 37 mL/min — ABNORMAL LOW (ref >60)
GFR calc non Af Amer: 32 mL/min — ABNORMAL LOW (ref >60)
Glucose, Bld: 136 mg/dL — ABNORMAL HIGH (ref 70–99)
Potassium: 5 mmol/L (ref 3.5–5.1)
Sodium: 132 mmol/L — ABNORMAL LOW (ref 135–145)

## 2019-01-25 LAB — BRAIN NATRIURETIC PEPTIDE: B Natriuretic Peptide: 971 pg/mL — ABNORMAL HIGH (ref 0.0–100.0)

## 2019-01-25 LAB — TROPONIN I: Troponin I: <0.03 ng/mL (ref <0.03)

## 2019-01-25 MED ORDER — FUROSEMIDE 10 MG/ML IJ SOLN
40.0000 mg | Freq: Once | INTRAMUSCULAR | Status: AC
  Administered 2019-01-25: 40 mg via INTRAVENOUS
  Filled 2019-01-25: qty 4

## 2019-01-25 MED ORDER — SODIUM CHLORIDE 0.9% FLUSH: 3.0000 mL | Freq: Once | INTRAVENOUS | Status: DC

## 2019-01-25 NOTE — Telephone Encounter (Signed)
Spoke with patient regarding how she was doing since receiving IV lasix last week on 01/22/2019. She says that she's doing terrible and has gained another 4-5 pounds since getting the IV lasix. She says that she also didn't urinate much after she received the IV lasix. Continues to have shortness of breath along with edema in her legs.   Advised patient to call her nephrologist to see if he would be comfortable with more diuretics. Reviewed BMP from 01/22/2019 with patient and her potassium was 4.8, creatinine 1.71 (worsening) and GFR 31 (worsening). BNP was 1208.0. Prior to IV lasix, she also had received 2 days of 2.5mg  metolazone.   Explained that with worsening renal function (creatinine has declined from 1.04-> 1.71) in the last month, that I wasn't comfortable giving her more diuretic and potentially worsening her renal function even more. Patient understood and said she would call her nephrologist for further guidance.   Also advised patient that should her weight continue to rise and symptoms worsen, that she should present to the ED for further evaluation.

## 2019-01-25 NOTE — ED Notes (Signed)
Pt denies cough, congestion, fever- pt has had to sleep with husbands wedge under her

## 2019-01-25 NOTE — ED Triage Notes (Signed)
Increased edema legs and abdomen and SOB x 3 weeks. History of CHF.

## 2019-01-25 NOTE — Telephone Encounter (Signed)
Called patient to see how she was feeling and to see if the nurse came to do blood cultures. The nurse was there during the call and stated that she  was unsuccessful in getting the blood. The patient stated that she had fluid overload and needed to go to the hospital and may be admitted. I ask the patient to see if they could draw blood cultures at the hospital. Dr Delaine Lame will put orders in her note.

## 2019-01-25 NOTE — ED Notes (Signed)
Pt ambulated in hall and O2 sats stayed between 97-100%- pt states she feels out of breath after

## 2019-01-25 NOTE — ED Notes (Signed)
EDP in with patient at this time.

## 2019-01-25 NOTE — Discharge Instructions (Addendum)
Hold your losartan until you see Dr. Juleen China. Call his office tomorrow morning for a close follow up. Return to the ER for shortness of breath or chest pain.

## 2019-01-25 NOTE — ED Provider Notes (Signed)
[facility] Emergency Department Provider Note  ____________________________________________  Time seen: Approximately 4:28 PM  I have reviewed the triage vital signs and the nursing notes.   HISTORY  Chief Complaint Leg Swelling and Shortness of Breath   HPI [name] is a 62 y.o. female with a history of CHF with EF of 40 to 45%, CAD, chronic kidney disease who presents for evaluation of fluid overload.  Patient reports started pound weight gain over the last 4 weeks.  Her doctors have been adjusting her diuretics at home with no significant improvement.  She denies shortness of breath but endorses severe swelling of her lower extremities and her abdomen.  No chest pain.  No fever, no vomiting or diarrhea.  She is not on oxygen.  Past Medical History:  Diagnosis Date   Acute myocardial infarction, subendocardial infarction, initial episode of care ([place]) 07/21/2012   Acute osteomyelitis involving ankle and foot ([place]) 06/09/2354   Acute systolic heart failure ([place]) 07/21/2012   New onset 07/19/12; admission to [facility] ED. Elevated Troponins.  S/p 2D-echo with EF 20-25%.  S/p cardiac catheterization with stenting LAD.  Repeat 2D-echo 10/2011 with improved EF of 35%.    Anemia    Automatic implantable cardioverter-defibrillator in situ    a. MDT CRT-D 06/2014, SN: [id number]  .removed in feb 2020   CAD (coronary artery disease)    a. cardiac cath 101/04/2012: PCI/DES to chronically occluded mLAD, consideration PCI to diag branch in 4 weeks.    Cataract    Chicken pox    Chronic kidney disease    Chronic systolic CHF (congestive heart failure) (HCC)    a. mixed ICM & NICM; b. EF 20-25% by echo 07/2012, mid-dist 2/3 of LV sev HK/AK, mild MR. echo 10/2012: EF 30-35%, sev HK ant-septal & inf walls, GR1DD, mild MR, PASP 33. c. echo 02/2013: EF 30%, GR1DD, mild MR. echo 04/2014: EF 30%, Septal-lat dyssynchrony, global HK, inf AK, GR1DD, mild MR. d. echo  10/2014: EF50-55%, WM nl, GR1DD, septal mild paradox. e. echo 02/2015: EF 50-55%, wm    Depression    Heart attack ([place])    Heel ulcer ([place]) 04/27/2015   History of blood transfusion ~ 2011   "plasma; had neuropathy; couldn't walk"   Hypertension    LBBB (left bundle branch block)    Neuromuscular disorder ([place])    Neuropathy 2011   Obesity, unspecified    OSA on CPAP    Moderate with AHI 23/hr and now on CPAP at 16cm H2O.  Her DME is [facility]   Pure hypercholesterolemia    Sepsis ([place]) 09/2018   Type II diabetes mellitus ([place])    Unspecified vitamin D deficiency     Patient Active Problem List   Diagnosis Date Noted   Weakness generalized 12/22/2018   Shortness of breath 12/22/2018   Palliative care encounter 12/22/2018   Endocarditis 12/14/2018   NSTEMI (non-ST elevated myocardial infarction) ([place]) 11/22/2018   Acute exacerbation of CHF (congestive heart failure) ([place]) 10/31/2018   Acute on chronic systolic CHF (congestive heart failure) ([name]) 10/28/2018   Lymphedema 10/28/2018   History of amputation of lesser toe of right foot ([place]) 04/19/2018   ICD (implantable cardioverter-defibrillator) in place 04/19/2018   Paraparesis of both lower limbs ([place]) 03/29/2018   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   Paroxysmal atrial fibrillation ([place]) 03/29/2018   Class 3 obesity due to excess calories with serious comorbidity and body mass index (BMI) of 40.0 to 44.9 in adult  09/02/2016   History of CVA (cerebrovascular accident) 11/11/2015   Chronic renal insufficiency 08/04/2015   OSA (obstructive sleep apnea) 08/15/2014   Morbid obesity ([place]) 10/26/2013   Essential hypertension, benign 04/29/2013   Pure hypercholesterolemia 12/07/2012   Iron deficiency anemia 12/07/2012   Diabetic peripheral neuropathy associated with type 2 diabetes mellitus ([place]) 76/73/4193   Chronic systolic congestive heart failure ([place]) 07/29/2012   Left bundle-branch block  07/25/2012   Atherosclerotic heart disease of native coronary artery without angina pectoris 07/25/2012    Past Surgical History:  Procedure Laterality Date   AMPUTATION TOE Right 06/18/2015   Procedure: AMPUTATION TOE;  Surgeon: Samara Deist, DPM;  Location: [facility] ORS;  Service: Podiatry;  Laterality: Right;   AMPUTATION TOE Left 10/08/2018   Procedure: AMPUTATION TOE LEFT 2ND;  Surgeon: Albertine Patricia, DPM;  Location: [facility] ORS;  Service: Podiatry;  Laterality: Left;   BACK SURGERY     BI-VENTRICULAR IMPLANTABLE CARDIOVERTER DEFIBRILLATOR N/A 07/06/2014   Procedure: BI-VENTRICULAR IMPLANTABLE CARDIOVERTER DEFIBRILLATOR  (CRT-D);  Surgeon: Deboraha Sprang, MD;  Location: [facility] CATH LAB;  Service: Cardiovascular;  Laterality: N/A;   BI-VENTRICULAR IMPLANTABLE CARDIOVERTER DEFIBRILLATOR  (CRT-D)  07/06/2014   BILATERAL OOPHORECTOMY  01/2011   ovarian cyst benign   COLONOSCOPY WITH PROPOFOL Left 02/22/2015   Procedure: COLONOSCOPY WITH PROPOFOL;  Surgeon: Hulen Luster, MD;  Location: [facility] ENDOSCOPY;  Service: Endoscopy;  Laterality: Left;   CORONARY ANGIOPLASTY WITH STENT PLACEMENT Left 07/2012   new onset systolic CHF; elevated troponins.  Cardiac catheterization with stenting to LAD; EF 15%.  2D-echo: EF 20-25%.   ESOPHAGOGASTRODUODENOSCOPY N/A 02/22/2015   Procedure: ESOPHAGOGASTRODUODENOSCOPY (EGD);  Surgeon: Hulen Luster, MD;  Location: [facility] ENDOSCOPY;  Service: Endoscopy;  Laterality: N/A;   INCISION AND DRAINAGE ABSCESS Right 2007   groin; with ICU stay due to sepsis.   LAPAROSCOPIC CHOLECYSTECTOMY  2011   LEFT HEART CATH AND CORONARY ANGIOGRAPHY N/A 10/05/2018   Procedure: LEFT HEART CATH AND CORONARY ANGIOGRAPHY;  Surgeon: Minna Merritts, MD;  Location: [place] CV LAB;  Service: Cardiovascular;  Laterality: N/A;   LEFT HEART CATHETERIZATION WITH CORONARY ANGIOGRAM N/A 07/21/2012   Procedure: LEFT HEART CATHETERIZATION WITH CORONARY ANGIOGRAM;  Surgeon: Jolaine Artist,  MD;  Location: [facility] CATH LAB;  Service: Cardiovascular;  Laterality: N/A;   LUMBAR [place]   L4-5   PACEMAKER REMOVAL  11/2018   due to infection around pacemaker   [place] (PCI-S) N/A 07/23/2012   Procedure: PERCUTANEOUS CORONARY STENT INTERVENTION (PCI-S);  Surgeon: Sherren Mocha, MD;  Location: [facility] CATH LAB;  Service: Cardiovascular;  Laterality: N/A;   PERIPHERAL VASCULAR BALLOON ANGIOPLASTY Left 10/06/2018   Procedure: PERIPHERAL VASCULAR BALLOON ANGIOPLASTY;  Surgeon: Katha Cabal, MD;  Location: [place] CV LAB;  Service: Cardiovascular;  Laterality: Left;   PERIPHERAL VASCULAR CATHETERIZATION N/A 02/10/2015   Procedure: Picc Line Insertion;  Surgeon: Katha Cabal, MD;  Location: [place] CV LAB;  Service: Cardiovascular;  Laterality: N/A;   TEE WITHOUT CARDIOVERSION N/A 10/02/2018   Procedure: TRANSESOPHAGEAL ECHOCARDIOGRAM (TEE);  Surgeon: Wellington Hampshire, MD;  Location: [facility] ORS;  Service: Cardiovascular;  Laterality: N/A;   [place]  01/2011   Fibroids/DUB.  Ovaries removed. [place].    Prior to Admission medications   Medication Sig Start Date End Date Taking? Authorizing Provider  aspirin EC 81 MG EC tablet Take 1 tablet (81 mg total) by mouth daily. 11/24/18  Yes Vianne Bulls,  [place], MD  CALCIUM PO Take 1 tablet by mouth 2 (two) times daily.   Yes [provider]  carvedilol (COREG) 6.25 MG tablet Take 1 tablet (6.25 mg total) by mouth 2 (two) times daily with a meal. 12/16/18  Yes Gladstone Lighter, MD  clopidogrel (PLAVIX) 75 MG tablet Take 1 tablet (75 mg total) by mouth daily. 12/24/18  Yes Rutherford Guys, MD  Ferrous Sulfate (IRON PO) Take 325 mg by mouth 2 (two) times daily.   Yes [provider]  gabapentin (NEURONTIN) 600 MG tablet Take 1 tablet (600 mg total) by mouth 2 (two) times daily. Takes 1 in the morning and two at bedtime 12/24/18  Yes  Rutherford Guys, MD  glimepiride (AMARYL) 4 MG tablet Take 4 mg by mouth 2 (two) times daily.   Yes [provider]  glucose blood (ONE TOUCH ULTRA TEST) test strip USE AS DIRECTED TO CHECK BLOOD SUGAR 3 TIMES DAILY 01/09/19  Yes Rutherford Guys, MD  insulin glargine (LANTUS) 100 UNIT/ML injection Inject 0.16 mLs (16 Units total) into the skin at bedtime. 11/24/18  Yes Epifanio Lesches, MD  losartan (COZAAR) 50 MG tablet Take 0.5 tablets (25 mg total) by mouth daily. 01/01/19  Yes Darylene Price A, FNP  Multiple Vitamin (MULTIVITAMIN) tablet Take 1 tablet by mouth daily.   Yes [provider]  Omega-3 Fatty Acids (FISH OIL PO) Take 1 capsule by mouth 2 (two) times a day.    Yes [provider]  potassium chloride (K-DUR,KLOR-CON) 10 MEQ tablet Take 1 tablet (10 mEq total) by mouth as needed. On days you take the 3rd torsemide dose 01/01/19  Yes Hackney, Otila Kluver A, FNP  torsemide (DEMADEX) 20 MG tablet Take 1 tablet (20 mg total) by mouth 2 (two) times daily. And additional 20mg  if needed for a 3rd dose 01/01/19  Yes Hackney, Aura Fey, FNP  traMADol (ULTRAM) 50 MG tablet Take 1 tablet (50 mg total) by mouth every 6 (six) hours as needed. 01/07/19  Yes Rutherford Guys, MD    Allergies Patient has no known allergies.  Family History  Problem Relation Age of Onset   Diabetes Mother    Hypertension Mother    Arthritis Mother        knees, lumbar DDD, cervical DDD   Cancer Father        prostate,skin,lymphoma.   Cancer Brother 22       bladder cancer; non-smoker   Diabetes Maternal Grandmother    Heart disease Maternal Grandmother    COPD Maternal Grandmother    Diabetes Paternal Grandmother    Obesity Brother    Diabetes Son    Hypertension Son    Cancer Maternal Grandfather    Diabetes Paternal Grandfather     Social History Social History   Tobacco Use   Smoking status: Never Smoker   Smokeless tobacco: Never Used  Substance Use Topics    Alcohol use: No    Alcohol/week: 0.0 standard drinks   Drug use: No    Review of Systems  Constitutional: Negative for fever. + weight gain Eyes: Negative for visual changes. ENT: Negative for sore throat. Neck: No neck pain  Cardiovascular: Negative for chest pain. Respiratory: Negative for shortness of breath. Gastrointestinal: Negative for abdominal pain, vomiting or diarrhea. Genitourinary: Negative for dysuria. Musculoskeletal: Negative for back pain. + b/l leg swelling  Skin: Negative for rash. Neurological: Negative for headaches, weakness or numbness. Psych: No SI or HI  ____________________________________________  PHYSICAL EXAM:  VITAL SIGNS: ED Triage Vitals  Enc Vitals Group     BP 01/25/19 1559 103/87     Pulse Rate 01/25/19 1559 86     Resp 01/25/19 1559 20     Temp 01/25/19 1559 97.9 F (36.6 C)     Temp Source 01/25/19 1559 Oral     SpO2 01/25/19 1559 98 %     Weight 01/25/19 1600 (!) 304 lb (137.9 kg)     Height 01/25/19 1600 5\' 8"  (1.727 m)     Head Circumference --      Peak Flow --      Pain Score 01/25/19 1559 0     Pain Loc --      Pain Edu? --      Excl. in [place]? --     Constitutional: Alert and oriented. Well appearing and in no apparent distress. HEENT:      Head: Normocephalic and atraumatic.         Eyes: Conjunctivae are normal. Sclera is non-icteric.       Mouth/Throat: Mucous membranes are moist.       Neck: Supple with no signs of meningismus. Cardiovascular: Regular rate and rhythm. No murmurs, gallops, or rubs. 2+ symmetrical distal pulses are present in all extremities. No JVD. Respiratory: Normal respiratory effort. Lungs are clear to auscultation bilaterally. No wheezes, crackles, or rhonchi.  Gastrointestinal: Soft, non tender, and non distended with positive bowel sounds. No rebound or guarding. Musculoskeletal: 3+ pitting edema bilaterally Neurologic: Normal speech and language. Face is symmetric. Moving all extremities. No  gross focal neurologic deficits are appreciated. Skin: Skin is warm, dry and intact. No rash noted. Psychiatric: Mood and affect are normal. Speech and behavior are normal.  ____________________________________________   LABS (all labs ordered are listed, but only abnormal results are displayed)  Labs Reviewed  BASIC METABOLIC PANEL - Abnormal; Notable for the following components:      Result Value   Sodium 132 (*)    Chloride 96 (*)    Glucose, Bld 136 (*)    BUN 85 (*)    Creatinine, Ser 1.69 (*)    Calcium 8.8 (*)    GFR calc non Af Amer 32 (*)    GFR calc Af Amer 37 (*)    All other components within normal limits  CBC - Abnormal; Notable for the following components:   RBC 3.40 (*)    Hemoglobin 9.6 (*)    HCT 30.8 (*)    All other components within normal limits  BRAIN NATRIURETIC PEPTIDE - Abnormal; Notable for the following components:   B Natriuretic Peptide 971.0 (*)    All other components within normal limits  CULTURE, BLOOD (SINGLE)  TROPONIN I   ____________________________________________  EKG  ED ECG REPORT I, Rudene Re, the attending physician, personally viewed and interpreted this ECG.  Normal sinus rhythm with first-degree AV block, rate of 84, prolonged QTC, left bundle branch block, no ST elevations or depressions.  Unchanged from prior. ____________________________________________  RADIOLOGY  I have personally reviewed the images performed during this visit and I agree with the Radiologist's read.   Interpretation by Radiologist:  Dg Chest 2 View  Result Date: 01/25/2019 CLINICAL DATA:  Shortness of breath and worsening lower leg swelling over the past month. EXAM: CHEST - 2 VIEW COMPARISON:  Chest x-ray dated December 15, 2018. FINDINGS: Stable mild cardiomegaly. New mild perihilar predominant diffuse interstitial thickening. New small right pleural effusion slightly increased loculated pleural  fluid along the right major fissure. Mild  right basilar atelectasis. No pneumothorax. No acute osseous abnormality. IMPRESSION: 1. New mild interstitial pulmonary edema with new small right pleural effusion and slightly increased loculated fluid along the right major fissure. Electronically Signed   By: Titus Dubin M.D.   On: 01/25/2019 17:25      ____________________________________________   PROCEDURES  Procedure(s) performed: None Procedures Critical Care performed:  None ____________________________________________   INITIAL IMPRESSION / ASSESSMENT AND PLAN / ED COURSE   63 y.o. female with a history of CHF with EF of 40 to 45%, CAD, chronic kidney disease who presents for evaluation of fluid overload.  Patient with normal work of breathing, normal sats, clear lungs, 3+ pitting edema on bilateral lower extremity. Patient currently weighs 146.9kg. That is a 17kg weight gain since last CHF clinic visit on 3/18. She has 3+ pitting edema. Will give a dose of IV lasix, will check labs, will check ambulatory sats.  Review of epic shows that patient had a positive blood culture from 10 days ago which was drawn from a PICC line.  Her home health nurse attempted to draw a repeat blood culture today unsuccessfully.  They have requested that we draw a blood culture here in the emergency room to ensure that the positive culture was a contaminant.  The PICC line is no longer present, patient has had no fevers, chills or any systemic symptoms concerning for bacteremia.  We will send 1 set of blood cultures at this time.  Clinical Course as of Jan 25 1919  Mon Jan 25, 2019  1916 Patient's kidney function has been getting slightly worse over the last several weeks however it is stable today.  I spoke with Dr. Candiss Norse from nephrology as Darylene Price from the CHF clinic does not feel comfortable changing patient's diuretics with her worsening kidney function.  Dr. Candiss Norse recommended stopping the losartan and follow up with Dr. Juleen China in 2 days.  Discussed these recommendations with the patient.  Patient was able to ambulate and kept her sats in the upper 90s with no hypoxia.  Therefore patient be discharged home for outpatient management at this time.  Discussed strict return precautions and close follow-up.   [CV]    Clinical Course User Index [CV] Alfred Levins [place], MD     As part of my medical decision making, I reviewed the following data within the [facility] notes reviewed and incorporated, Labs reviewed , EKG interpreted , Old EKG reviewed, Old chart reviewed, Radiograph reviewed , A consult was requested and obtained from this/these consultant(s) Nephrology, Notes from prior ED visits and [place] Controlled Substance Database    Pertinent labs & imaging results that were available during my care of the patient were reviewed by me and considered in my medical decision making (see chart for details).    ____________________________________________   FINAL CLINICAL IMPRESSION(S) / ED DIAGNOSES  Final diagnoses:  Acute on chronic congestive heart failure, unspecified heart failure type ([place])      NEW MEDICATIONS STARTED DURING THIS VISIT:  ED Discharge Orders    None       Note:  This document was prepared using Dragon voice recognition software and may include unintentional dictation errors.    Alfred Levins, [place], MD 01/25/19 Lurena Nida

## 2019-01-26 ENCOUNTER — Encounter: Payer: Self-pay | Admitting: Nurse Practitioner

## 2019-01-26 ENCOUNTER — Other Ambulatory Visit: Payer: PPO | Admitting: Nurse Practitioner

## 2019-01-26 ENCOUNTER — Telehealth: Payer: Self-pay

## 2019-01-26 DIAGNOSIS — G4733 Obstructive sleep apnea (adult) (pediatric): Secondary | ICD-10-CM | POA: Diagnosis not present

## 2019-01-26 DIAGNOSIS — R531 Weakness: Secondary | ICD-10-CM | POA: Diagnosis not present

## 2019-01-26 DIAGNOSIS — J189 Pneumonia, unspecified organism: Secondary | ICD-10-CM | POA: Diagnosis not present

## 2019-01-26 DIAGNOSIS — Z515 Encounter for palliative care: Secondary | ICD-10-CM | POA: Diagnosis not present

## 2019-01-26 DIAGNOSIS — R0602 Shortness of breath: Secondary | ICD-10-CM

## 2019-01-26 DIAGNOSIS — I5022 Chronic systolic (congestive) heart failure: Secondary | ICD-10-CM | POA: Diagnosis not present

## 2019-01-26 DIAGNOSIS — A492 Hemophilus influenzae infection, unspecified site: Secondary | ICD-10-CM | POA: Diagnosis not present

## 2019-01-26 NOTE — Telephone Encounter (Signed)
Spoke with patient to remind of missed remote transmission 

## 2019-01-26 NOTE — Progress Notes (Signed)
Therapist, nutritional Palliative Care Consult Note Telephone: [phone]  Fax: [phone]  PATIENT NAME: [name] DOB: [birth year] MRN: [id number]  PRIMARY CARE PROVIDER:   Myles Lipps, MD  REFERRING PROVIDER:  Myles Lipps, MD [address] [place], [place] [address]  RESPONSIBLE PARTY:   Self  Due to the COVID-19 crisis, this visit was done via telephone as attempted to do a Telehealth visit though difficulty with equipment and unable to connect to the video. Continued with  telemedicine from my office and it was initiated and consent by this patient and or family.  RECOMMENDATIONS and PLAN:  1. Palliative care encounter Z51.5; Palliative medicine team will continue to support patient, patient's family, and medical team. Visit consisted of counseling and education dealing with the complex and emotionally intense issues of symptom management and palliative care in the setting of serious and potentially life-threatening illness  Today with PC telehealth visit she continues with dyspnea, fatigue, severe edema limiting her functional ability and impacting her quality of life.  2. Dyspneic R06.00 secondary to remain stable at present time. Continue daily weights   3. Generalized weaknessR53.1  secondary to CHF/CM continue with therapy as able. Encourage energy conservation and rest times.  ASSESSMENT:    I called Vickie Location manager. We talked about how she was feeling today. She shared that she is tired and swelling in her legs is worse with her weight unchanged from yesterday. We talked about her visit to the emergency department last night. She shared that she does not feel any better her shortness of breath is the same. She was able to speak in sentences on the phone and did not appear short of breath. We talked about her Telehealth visit with [name] in CPAP. She shared that she was not aware that it was the potential for making her so tired. And she is  eagerly awaiting her supplies so she can restart her CPAP machine. We talked about her Telehealth visit with [name] and thoughts are not to replace the pacemaker. She didn't understand why that would not be an option though discuss that high risk of infection and reoccurrence. Vickie. [name] that she did call [name] nephrologist and plan is for her to have a visit next week with lab draw. We talked about her discussions with [name] at [facility]. We talked about overall progression of chronic disease with worsening quality of life. We talked about her functional ability prior to removal pacemaker and since. She share she was able to ambulate distances and now she becomes very short of breath with a few steps. We talked about quality of life versus quantity of days. We talked about medical goals of care and her wishes are to continue aggressive interventions with full code. We talked about if her kidney function does worsen if she would be interested in dialysis and she verbalize that she would. At present time she continues to receive Home Health nursing, therapy. She shared supposed to end in about two weeks. We talked about high risk for rehospitalization. We talked about possibly a new Baseline clinically though her body is overall slowly failing. [name] endorses that she does realize that but she wants to continue aggressive interventions at present time. We talked about role of palliative care and plan of care. Schedule follow-up palliative care visit and will revisit goals of care at that time. Therapeutic listening and emotional support provided. Contact information provided. Questions answered  just satisfaction.  Weight 304 lb  Recent TEE shows EF 25 to 30% at [facility]   I spent 45 minutes providing this consultation,  from 2:30pm to 3:15. More than 50% of the time in this consultation was spent coordinating communication.   HISTORY OF PRESENT ILLNESS:  [name] is a  63 y.o. year old female with multiple medical problems including Late onset CVA, Systolic congestive heart failure, coronary artery disease, automatic implantable cardioverter-defibrillator placed, myocardial infarction, left bundle branch block, his shaved endocarditis, obstructive sleep apnea, diabetes, hyperlipidemia, lymphedema, hypertension, obesity, neuropathy, chronic kidney disease, iron deficiency anemia, depression, history of osteomyelitis ankle/foot 2013, cataracts, neuropathy, vaginal hysterectomy with oophorectomy, tubal ligation, TEE without cardioversion, right, left toe amputation, lumbar disc surgery, cholecystectomy. She was seen by infectious disease 4 / 2 / 2020  for mssa bacteremia left second toe abscess osteomyelitis secondary to mssa s/p amputation. She was doing well on cefazolin though increase and shortness of breath and aicd was removed on 12/11/2018. Last day of antibiotics 4 / 17 / 2020 and she will have finished six weeks. 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 What cultures from 2 / 25 / 2020, 2 / 29 / 2020, 3 / 10 / 2020 negative for mssa. Plan is to get blood cultures 5 days after antibiotic is complete . She did have a Telehealth visit with [name] cardiologist 4 / 2 / 2020 with dyspnea multifactorial and increase diuretics support contributed from her heart with loss of CRT there will be loss of synchrony and possibly worsening LV function. Plan is consolidate Demodex, reassess renal function and hemoglobin in addition to salt and fluid restriction and follow up in 2 months. [name] by Telehealth Cardiology 4 / 15 / 2020 obstructive sleep apnea. Her documentation she has not been using her CPAP in two years because the facemask she were eroded. Plan is with history of moderate obstructive sleep apnea and  overall aih of 23 events per hour and 87 events per hour during REM sleep in sleep study 2016 with reduction and sleep efficiency with increased frequency of arousal from respiratory events with moderate snoring ordered supplies for a full face mask and Pap supplies. Plan is placed her on device on auto from 4 to 18 cm H2O and get a download in two weeks. Obesity with a lot of fatigue and limited ability to exercise. Follow up in 3 months. She did receive IV Lasix on 4/17 / 2020 and followed up with Vickie Kindred FNP [facility]. She continued not to do well getting another 4-5 lbs since the receiving IV Lasix with not being able to urinate much and increasing shortness of breath with edema to legs. Advice to call nephrologist to see if compatible with more diuretics as BMP from 4 / 17 / 2020 potassium 4.8, creatinine 1.71 worsening and GFR 31 worsening with BNP 1208.0. She is also received two days of metolazone 2.5 mg. With worsening renal function creatinine decline from 1.04 greater than 1.71 in the last month it was recommended that she would call her nephrologist, if continue to rise she would go to the emergency department. She did go to the emergency department on 4/20 / 2020 with increase leg swelling and shortness of breath over the last four weeks with  no significant improvement with adjusting diuretics at home. She is not on oxygen. With history of CHF and EF of 40 to 45%, cad, chronic kidney disease with fluid overload, normal work of breathing with 3 + pitting edema bilateral lower extremity. 17 kg weight gain since last [facility]F Clinic visit on 3/18 / 2020. She did receive IV Lasix, lab work that chase had a positive blood culture from 10 days ago which was drawn from a PICC line by home health nurse though repeated during ED visit with questionable contamination. PICC line is no longer present. No fevers or concerning symptoms for bacteremia. Recommendations with kidney function  worsening over the last several weeks [name] nephrologist and [name] at [facility] did not feel comfortable changing diuretics with her worsening kidney function. After seeing recommended stopping losartan and following up with Nephrology in 2 days. She was able to ambulate and keep her SATs above 90% with no hypoxia. She was discharged home with strict precautions and close to follow up. She continues to live at home with her husband. Palliative Care was asked to help address goals of care.   CODE STATUS: 40%  PPS: Full code HOSPICE ELIGIBILITY/DIAGNOSIS: TBD  PAST MEDICAL HISTORY:  Past Medical History:  Diagnosis Date   Acute myocardial infarction, subendocardial infarction, initial episode of care (HCC) 07/21/2012   Acute osteomyelitis involving ankle and foot (HCC) 02/06/2015   Acute systolic heart failure (HCC) 07/21/2012   New onset 07/19/12; admission to [facility] ED. Elevated Troponins.  S/p 2D-echo with EF 20-25%.  S/p cardiac catheterization with stenting LAD.  Repeat 2D-echo 10/2011 with improved EF of 35%.    Anemia    Automatic implantable cardioverter-defibrillator in situ    a. MDT CRT-D 06/2014, SN: [id number]  .removed in feb 2020   CAD (coronary artery disease)    a. cardiac cath 101/04/2012: PCI/DES to chronically occluded mLAD, consideration PCI to diag branch in 4 weeks.    Cataract    Chicken pox    Chronic kidney disease    Chronic systolic CHF (congestive heart failure) (HCC)    a. mixed ICM & NICM; b. EF 20-25% by echo 07/2012, mid-dist 2/3 of LV sev HK/AK, mild MR. echo 10/2012: EF 30-35%, sev HK ant-septal & inf walls, GR1DD, mild MR, PASP 33. c. echo 02/2013: EF 30%, GR1DD, mild MR. echo 04/2014: EF 30%, Septal-lat dyssynchrony, global HK, inf AK, GR1DD, mild MR. d. echo 10/2014: EF50-55%, WM nl, GR1DD, septal mild paradox. e. echo 02/2015: EF 50-55%, wm    Depression    Heart attack (HCC)    Heel ulcer (HCC) 04/27/2015   History of blood  transfusion ~ 2011   "plasma; had neuropathy; couldn't walk"   Hypertension    LBBB (left bundle branch block)    Neuromuscular disorder (HCC)    Neuropathy 2011   Obesity, unspecified    OSA on CPAP    Moderate with AHI 23/hr and now on CPAP at 16cm H2O.  Her DME is [facility]   Pure hypercholesterolemia    Sepsis (HCC) 09/2018   Type II diabetes mellitus (HCC)    Unspecified vitamin D deficiency     SOCIAL HX:  Social History   Tobacco Use   Smoking status: Never Smoker   Smokeless tobacco: Never Used  Substance Use Topics   Alcohol use: No    Alcohol/week: 0.0 standard drinks    ALLERGIES: No Known Allergies   PERTINENT MEDICATIONS:  Outpatient Encounter Medications as of  01/26/2019  Medication Sig   aspirin EC 81 MG EC tablet Take 1 tablet (81 mg total) by mouth daily.   CALCIUM PO Take 1 tablet by mouth 2 (two) times daily.   carvedilol (COREG) 6.25 MG tablet Take 1 tablet (6.25 mg total) by mouth 2 (two) times daily with a meal.   clopidogrel (PLAVIX) 75 MG tablet Take 1 tablet (75 mg total) by mouth daily.   Ferrous Sulfate (IRON PO) Take 325 mg by mouth 2 (two) times daily.   gabapentin (NEURONTIN) 600 MG tablet Take 1 tablet (600 mg total) by mouth 2 (two) times daily. Takes 1 in the morning and two at bedtime   glimepiride (AMARYL) 4 MG tablet Take 4 mg by mouth 2 (two) times daily.   glucose blood (ONE TOUCH ULTRA TEST) test strip USE AS DIRECTED TO CHECK BLOOD SUGAR 3 TIMES DAILY   insulin glargine (LANTUS) 100 UNIT/ML injection Inject 0.16 mLs (16 Units total) into the skin at bedtime.   losartan (COZAAR) 50 MG tablet Take 0.5 tablets (25 mg total) by mouth daily.   Multiple Vitamin (MULTIVITAMIN) tablet Take 1 tablet by mouth daily.   Omega-3 Fatty Acids (FISH OIL PO) Take 1 capsule by mouth 2 (two) times a day.    potassium chloride (K-DUR,KLOR-CON) 10 MEQ tablet Take 1 tablet (10 mEq total) by mouth as needed. On days you take the 3rd  torsemide dose   torsemide (DEMADEX) 20 MG tablet Take 1 tablet (20 mg total) by mouth 2 (two) times daily. And additional 20mg  if needed for a 3rd dose   traMADol (ULTRAM) 50 MG tablet Take 1 tablet (50 mg total) by mouth every 6 (six) hours as needed.   No facility-administered encounter medications on file as of 01/26/2019.     PHYSICAL EXAM:   Deferred  Maleni Seyer Z Jamicah Anstead, [name]

## 2019-01-28 ENCOUNTER — Telehealth: Payer: Self-pay | Admitting: Family Medicine

## 2019-01-28 DIAGNOSIS — G4733 Obstructive sleep apnea (adult) (pediatric): Secondary | ICD-10-CM | POA: Diagnosis not present

## 2019-01-28 NOTE — Telephone Encounter (Signed)
Copied from [place] [phone]. Topic: Quick Communication - Home Health Verbal Orders >> Jan 28, 2019  4:35 PM Yvette Rack wrote: Caller/Agency: Chiquita Loth with [facility]  Callback Number: [phone] Requesting OT/PT/Skilled Nursing/Social Work/Speech Therapy: skilled nursing  Frequency: 1 time a week for 3 more weeks and 2 prn visits

## 2019-01-28 NOTE — Telephone Encounter (Signed)
Lm message for [employer] Dy to c/b for verbal order approval for pt

## 2019-01-28 NOTE — Telephone Encounter (Signed)
Provided VO to [place] at [facility] for [place] 1/wk x 3 weeks then two PRN visits.

## 2019-01-30 LAB — CULTURE, BLOOD (SINGLE)
Culture: NO GROWTH
Special Requests: ADEQUATE

## 2019-02-02 ENCOUNTER — Encounter: Payer: Self-pay | Admitting: Cardiology

## 2019-02-02 NOTE — Progress Notes (Signed)
Letter  

## 2019-02-03 ENCOUNTER — Other Ambulatory Visit: Payer: Self-pay

## 2019-02-03 ENCOUNTER — Encounter: Payer: Self-pay | Admitting: Family Medicine

## 2019-02-03 ENCOUNTER — Telehealth (INDEPENDENT_AMBULATORY_CARE_PROVIDER_SITE_OTHER): Payer: PPO | Admitting: Family Medicine

## 2019-02-03 DIAGNOSIS — I5022 Chronic systolic (congestive) heart failure: Secondary | ICD-10-CM

## 2019-02-03 DIAGNOSIS — E1165 Type 2 diabetes mellitus with hyperglycemia: Secondary | ICD-10-CM | POA: Diagnosis not present

## 2019-02-03 DIAGNOSIS — E1129 Type 2 diabetes mellitus with other diabetic kidney complication: Secondary | ICD-10-CM

## 2019-02-03 DIAGNOSIS — I10 Essential (primary) hypertension: Secondary | ICD-10-CM

## 2019-02-03 DIAGNOSIS — E7800 Pure hypercholesterolemia, unspecified: Secondary | ICD-10-CM | POA: Diagnosis not present

## 2019-02-03 DIAGNOSIS — D508 Other iron deficiency anemias: Secondary | ICD-10-CM | POA: Diagnosis not present

## 2019-02-03 DIAGNOSIS — IMO0002 Reserved for concepts with insufficient information to code with codable children: Secondary | ICD-10-CM

## 2019-02-03 NOTE — Progress Notes (Signed)
Pt c/o 6 week follow up on chronic medical conditions.

## 2019-02-03 NOTE — Progress Notes (Signed)
Virtual Visit Note  I connected with patient on 02/03/19 at 1204  by phone and verified that I am speaking with the correct person using two identifiers. [name] is currently located at home and patient is currently with them during visit. The provider, Rutherford Guys, MD is located in their office at time of visit.  I discussed the limitations, risks, security and privacy concerns of performing an evaluation and management service by telephone and the availability of in person appointments. I also discussed with the patient that there may be a patient responsible charge related to this service. The patient expressed understanding and agreed to proceed.   CC: routine followup  HPI  Patient is a 63 y.o. 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,  MSSA bacteremia 2/2 osteomyelitis s/p amputation, systolic CHF, OSA on cpap, DM2 on insulin, CKD, HTN, HLP, anemia here for routine followup   Last OV March 2020 Saw ID - abx stopped 01/22/2019 Saw cards/CHF clinic - diuretics increased, echo pending Saw Cards - started back on CPAP Palliative care involved  She is not doing well at all regarding SOB Has gained about 35 lbs since March - fluctuates up and down 1-2 lbs today 307 lbs Torsemide 40mg  AM and 20mg  at night - feels sometimes not helping Taking KCL 20 meq Has been using oxygen 2L at night Has been sitting in her recliner Legs and abd swollen Has been following fluid and salt restrictions Seen in ER 01/25/2019 - lasix IV  Next cards appt are in June  Wt Readings from Last 3 Encounters:  01/25/19 (!) 304 lb (137.9 kg)  01/20/19 295 lb (133.8 kg)  01/14/19 278 lb (126.1 kg)    ER spoke with renal - stopped losartan, needed to come in for labs which she has not done, has not seen renal yet Has not been checking BP Home health still comes out once a week  Not on cpap yet Should be getting CPAP equipment in  about 2 weeks  Checking cbgs TID 145 this morning Last night 173 Continues to adjust lantus (8-18 units)  glimperide 4mg  once a day If she eats late it causes a higher cbgs Denies any lows  Still taking iron for anemia   Lab Results  Component Value Date   HGBA1C 7.8 (H) 10/22/2018   HGBA1C 7.3 (A) 07/20/2018   HGBA1C 7.4 (H) 04/15/2018   Lab Results  Component Value Date   MICROALBUR 6.9 08/06/2016   LDLCALC 32 04/15/2018   CREATININE 1.69 (H) 01/25/2019   GFR 32  Lab Results  Component Value Date   CREATININE 1.69 (H) 01/25/2019   CREATININE 1.71 (H) 01/22/2019   CREATININE 1.48 (H) 12/24/2018   CBC Latest Ref Rng & Units 01/25/2019 12/24/2018 12/16/2018  WBC 4.0 - 10.5 K/uL 6.1 4.1 4.7  Hemoglobin 12.0 - 15.0 g/dL 9.6(L) 8.8(L) 8.3(L)  Hematocrit 36.0 - 46.0 % 30.8(L) 27.7(L) 26.9(L)  Platelets 150 - 400 K/uL 203 265 231    No Known Allergies  Prior to Admission medications   Medication Sig Start Date End Date Taking? Authorizing Provider  aspirin EC 81 MG EC tablet Take 1 tablet (81 mg total) by mouth daily. 11/24/18  Yes Epifanio Lesches, MD  CALCIUM PO Take 1 tablet by mouth 2 (two) times daily.   Yes [provider]  carvedilol (COREG) 6.25 MG tablet Take 1 tablet (6.25 mg total) by mouth 2 (two) times daily  with a meal. 12/16/18  Yes Gladstone Lighter, MD  clopidogrel (PLAVIX) 75 MG tablet Take 1 tablet (75 mg total) by mouth daily. 12/24/18  Yes Rutherford Guys, MD  Ferrous Sulfate (IRON PO) Take 325 mg by mouth 2 (two) times daily.   Yes [provider]  gabapentin (NEURONTIN) 600 MG tablet Take 1 tablet (600 mg total) by mouth 2 (two) times daily. Takes 1 in the morning and two at bedtime 12/24/18  Yes Rutherford Guys, MD  glimepiride (AMARYL) 4 MG tablet Take 4 mg by mouth 2 (two) times daily.   Yes [provider]  glucose blood (ONE TOUCH ULTRA TEST) test strip USE AS DIRECTED TO CHECK BLOOD SUGAR 3 TIMES DAILY 01/09/19  Yes  Rutherford Guys, MD  insulin glargine (LANTUS) 100 UNIT/ML injection Inject 0.16 mLs (16 Units total) into the skin at bedtime. 11/24/18  Yes Epifanio Lesches, MD  losartan (COZAAR) 50 MG tablet Take 0.5 tablets (25 mg total) by mouth daily. 01/01/19  Yes Darylene Price A, FNP  Multiple Vitamin (MULTIVITAMIN) tablet Take 1 tablet by mouth daily.   Yes [provider]  Omega-3 Fatty Acids (FISH OIL PO) Take 1 capsule by mouth 2 (two) times a day.    Yes [provider]  potassium chloride (K-DUR,KLOR-CON) 10 MEQ tablet Take 1 tablet (10 mEq total) by mouth as needed. On days you take the 3rd torsemide dose 01/01/19  Yes Hackney, Otila Kluver A, FNP  torsemide (DEMADEX) 20 MG tablet Take 1 tablet (20 mg total) by mouth 2 (two) times daily. And additional 20mg  if needed for a 3rd dose 01/01/19  Yes Hackney, Aura Fey, FNP  traMADol (ULTRAM) 50 MG tablet Take 1 tablet (50 mg total) by mouth every 6 (six) hours as needed. 01/07/19  Yes Rutherford Guys, MD    Past Medical History:  Diagnosis Date  . Acute myocardial infarction, subendocardial infarction, initial episode of care ([place]) 07/21/2012  . Acute osteomyelitis involving ankle and foot ([place]) 02/06/2015  . Acute systolic heart failure ([place]) 07/21/2012   New onset 07/19/12; admission to [facility] ED. Elevated Troponins.  S/p 2D-echo with EF 20-25%.  S/p cardiac catheterization with stenting LAD.  Repeat 2D-echo 10/2011 with improved EF of 35%.   . Anemia   . Automatic implantable cardioverter-defibrillator in situ    a. MDT CRT-D 06/2014, SN: [id number]  .removed in feb 2020  . CAD (coronary artery disease)    a. cardiac cath 101/04/2012: PCI/DES to chronically occluded mLAD, consideration PCI to diag branch in 4 weeks.   . Cataract   . Chicken pox   . Chronic kidney disease   . Chronic systolic CHF (congestive heart failure) (HCC)    a. mixed ICM & NICM; b. EF 20-25% by echo 07/2012, mid-dist 2/3 of LV sev HK/AK, mild MR. echo 10/2012: EF  30-35%, sev HK ant-septal & inf walls, GR1DD, mild MR, PASP 33. c. echo 02/2013: EF 30%, GR1DD, mild MR. echo 04/2014: EF 30%, Septal-lat dyssynchrony, global HK, inf AK, GR1DD, mild MR. d. echo 10/2014: EF50-55%, WM nl, GR1DD, septal mild paradox. e. echo 02/2015: EF 50-55%, wm   . Depression   . Heart attack ([place])   . Heel ulcer ([place]) 04/27/2015  . History of blood transfusion ~ 2011   "plasma; had neuropathy; couldn't walk"  . Hypertension   . LBBB (left bundle branch block)   . Neuromuscular disorder ([place])   . Neuropathy 2011  . Obesity, unspecified   .  OSA on CPAP    Moderate with AHI 23/hr and now on CPAP at 16cm H2O.  Her DME is [facility]  . Pure hypercholesterolemia   . Sepsis ([place]) 09/2018  . Type II diabetes mellitus ([place])   . Unspecified vitamin D deficiency     Past Surgical History:  Procedure Laterality Date  . AMPUTATION TOE Right 06/18/2015   Procedure: AMPUTATION TOE;  Surgeon: Samara Deist, DPM;  Location: [facility] ORS;  Service: Podiatry;  Laterality: Right;  . AMPUTATION TOE Left 10/08/2018   Procedure: AMPUTATION TOE LEFT 2ND;  Surgeon: Albertine Patricia, DPM;  Location: [facility] ORS;  Service: Podiatry;  Laterality: Left;  . BACK SURGERY    . BI-VENTRICULAR IMPLANTABLE CARDIOVERTER DEFIBRILLATOR N/A 07/06/2014   Procedure: BI-VENTRICULAR IMPLANTABLE CARDIOVERTER DEFIBRILLATOR  (CRT-D);  Surgeon: Deboraha Sprang, MD;  Location: [facility] CATH LAB;  Service: Cardiovascular;  Laterality: N/A;  . BI-VENTRICULAR IMPLANTABLE CARDIOVERTER DEFIBRILLATOR  (CRT-D)  07/06/2014  . BILATERAL OOPHORECTOMY  01/2011   ovarian cyst benign  . COLONOSCOPY WITH PROPOFOL Left 02/22/2015   Procedure: COLONOSCOPY WITH PROPOFOL;  Surgeon: Hulen Luster, MD;  Location: [facility] ENDOSCOPY;  Service: Endoscopy;  Laterality: Left;  . CORONARY ANGIOPLASTY WITH STENT PLACEMENT Left 07/2012   new onset systolic CHF; elevated troponins.  Cardiac catheterization with stenting to LAD; EF 15%.  2D-echo: EF 20-25%.  .  ESOPHAGOGASTRODUODENOSCOPY N/A 02/22/2015   Procedure: ESOPHAGOGASTRODUODENOSCOPY (EGD);  Surgeon: Hulen Luster, MD;  Location: [facility] ENDOSCOPY;  Service: Endoscopy;  Laterality: N/A;  . INCISION AND DRAINAGE ABSCESS Right 2007   groin; with ICU stay due to sepsis.  Marland Kitchen LAPAROSCOPIC CHOLECYSTECTOMY  2011  . LEFT HEART CATH AND CORONARY ANGIOGRAPHY N/A 10/05/2018   Procedure: LEFT HEART CATH AND CORONARY ANGIOGRAPHY;  Surgeon: Minna Merritts, MD;  Location: [place] CV LAB;  Service: Cardiovascular;  Laterality: N/A;  . LEFT HEART CATHETERIZATION WITH CORONARY ANGIOGRAM N/A 07/21/2012   Procedure: LEFT HEART CATHETERIZATION WITH CORONARY ANGIOGRAM;  Surgeon: Jolaine Artist, MD;  Location: [facility] CATH LAB;  Service: Cardiovascular;  Laterality: N/A;  . [place]   L4-5  . PACEMAKER REMOVAL  11/2018   due to infection around pacemaker  . PERCUTANEOUS CORONARY STENT INTERVENTION (PCI-S) N/A 07/23/2012   Procedure: PERCUTANEOUS CORONARY STENT INTERVENTION (PCI-S);  Surgeon: Sherren Mocha, MD;  Location[facility] C[facility] LAB;  Service: Cardiovascular;  Laterality: N/A;  . PERIPHERAL VASCULAR BALLOON ANGIOPLASTY Left 10/06/2018   Procedure: PERIPHERAL VASCULAR BALLOON ANGIOPLASTY;  Surgeon: Katha Cabal, MD;  Location: [place] CV LAB;  Service: Cardiovascular;  Laterality: Left;  . PERIPHERAL VASCULAR CATHETERIZATION N/A 02/10/2015   Procedure: Picc Line Insertion;  Surgeon: Katha Cabal, MD;  Location: [place] CV LAB;  Service: Cardiovascular;  Laterality: N/A;  . TEE WITHOUT CARDIOVERSION N/A 10/02/2018   Procedure: TRANSESOPHAGEAL ECHOCARDIOGRAM (TEE);  Surgeon: Wellington Hampshire, MD;  Location: [facility] ORS;  Service: Cardiovascular;  Laterality: N/A;  . [place]  . VAGINAL HYSTERECTOMY  01/2011   Fibroids/DUB.  Ovaries removed. [place].    Social History   Tobacco Use  . Smoking status: Never Smoker  . Smokeless tobacco: Never Used  Substance Use  Topics  . Alcohol use: No    Alcohol/week: 0.0 standard drinks    Family History  Problem Relation Age of Onset  . Diabetes Mother   . Hypertension Mother   . Arthritis Mother        knees, lumbar DDD, cervical DDD  . Cancer  Father        prostate,skin,lymphoma.  . Cancer Brother 84       bladder cancer; non-smoker  . Diabetes Maternal Grandmother   . Heart disease Maternal Grandmother   . COPD Maternal Grandmother   . Diabetes Paternal Grandmother   . Obesity Brother   . Diabetes Son   . Hypertension Son   . Cancer Maternal Grandfather   . Diabetes Paternal Grandfather     ROS  Per hpi  Objective  Vitals as reported by the patient: per above  Results for orders placed or performed in visit on 02/03/19 (from the past 24 hour(s))  Lipid panel     Status: Abnormal   Collection Time: 02/03/19  4:11 PM  Result Value Ref Range   Cholesterol, Total 82 (L) 100 - 199 mg/dL   Triglycerides 101 0 - 149 mg/dL   HDL 37 (L) >39 mg/dL   VLDL Cholesterol Cal 20 5 - 40 mg/dL   LDL Calculated 25 0 - 99 mg/dL   Chol/HDL Ratio 2.2 0.0 - 4.4 ratio   Narrative   Performed at:  [facility] [address] [place], [place]  [id number] Lab Director: Rush Farmer MD, Phone:  [phone]  Iron, TIBC and Ferritin Panel     Status: Abnormal   Collection Time: 02/03/19  4:11 PM  Result Value Ref Range   Total Iron Binding Capacity 285 250 - 450 ug/dL   UIBC 246 118 - 369 ug/dL   Iron 39 27 - 139 ug/dL   Iron Saturation 14 (L) 15 - 55 %   Ferritin 239 (H) 15 - 150 ng/mL   Narrative   Performed at:  [address], [place], [place]  [id number] Lab Director: Rush Farmer MD, Phone:  [phone]  CBC     Status: Abnormal   Collection Time: 02/03/19  4:11 PM  Result Value Ref Range   WBC 5.2 3.4 - 10.8 x10E3/uL   RBC 3.82 3.77 - 5.28 x10E6/uL   Hemoglobin 10.7 (L) 11.1 - 15.9 g/dL   Hematocrit 33.4 (L) 34.0 - 46.6 %   MCV 87 79 - 97 fL   MCH 28.0  26.6 - 33.0 pg   MCHC 32.0 31.5 - 35.7 g/dL   RDW 13.0 11.7 - 15.4 %   Platelets 177 150 - 450 x10E3/uL   Narrative   Performed at:  [facility] [address] [place], [place]  [id number] Lab Director: Rush Farmer MD, Phone:  [phone]  Comprehensive metabolic panel     Status: Abnormal   Collection Time: 02/03/19  4:11 PM  Result Value Ref Range   Glucose 123 (H) 65 - 99 mg/dL   BUN 65 (H) 8 - 27 mg/dL   Creatinine, Ser 1.74 (H) 0.57 - 1.00 mg/dL   GFR calc non Af Amer 31 (L) >59 mL/min/1.73   GFR calc Af Amer 35 (L) >59 mL/min/1.73   BUN/Creatinine Ratio 37 (H) 12 - 28   Sodium 141 134 - 144 mmol/L   Potassium 4.5 3.5 - 5.2 mmol/L   Chloride 98 96 - 106 mmol/L   CO2 25 20 - 29 mmol/L   Calcium 9.3 8.7 - 10.3 mg/dL   Total Protein 6.5 6.0 - 8.5 g/dL   Albumin 4.2 3.8 - 4.8 g/dL   Globulin, Total 2.3 1.5 - 4.5 g/dL   Albumin/Globulin Ratio 1.8 1.2 - 2.2   Bilirubin Total 0.3 0.0 - 1.2 mg/dL   Alkaline Phosphatase  65 39 - 117 IU/L   AST 19 0 - 40 IU/L   ALT 7 0 - 32 IU/L   Narrative   Performed at:  [address] [place], [place]  [id number] Lab Director: Rush Farmer MD, Phone:  [phone]  Hemoglobin A1c     Status: None   Collection Time: 02/03/19  4:11 PM  Result Value Ref Range   Hgb A1c MFr Bld 5.6 4.8 - 5.6 %   Est. average glucose Bld gHb Est-mCnc 114 mg/dL   Narrative   Performed at:  [address], [place], [place]  [id number] Lab Director: Rush Farmer MD, Phone:  [phone]  Microalbumin / creatinine urine ratio     Status: Abnormal   Collection Time: 02/03/19  4:19 PM  Result Value Ref Range   Creatinine, Urine 64.7 Not Estab. mg/dL   Microalbumin, Urine 27.9 Not Estab. ug/mL   Microalb/Creat Ratio 43 (H) 0 - 29 mg/g creat   Narrative   Performed at:  [address] [facility] [address], [place], [place]  [id number] Lab Director: Rush Farmer MD, Phone:  [phone]      ASSESSMENT and PLAN  1. DM type 2, uncontrolled, with renal complications ([place]) At goal. Cont current regime. + urine microalb - Hemoglobin A1c; Future - Comprehensive metabolic panel; Future - Microalbumin / creatinine urine ratio; Future - Microalbumin / creatinine urine ratio - Comprehensive metabolic panel - Hemoglobin A1c  2. Chronic systolic congestive heart failure (HCC) Decompensated. Discussed with renal per HF clinic provider concerns. Agree with increased diruesis. Increasing torsemide to 60mg  AM and 40mg  PM, will continue to titratrate as needed. Will cont to monitor BMP.  3. Pure hypercholesterolemia Controlled. Continue current regime.  - Lipid panel; Future - Lipid panel  4. Essential hypertension, benign Will check at followup  5. Other iron deficiency anemia Improved.  - CBC; Future - Iron, TIBC and Ferritin Panel; Future - Iron, TIBC and Ferritin Panel - CBC  Other orders - torsemide (DEMADEX) 20 MG tablet; Take 3 tablets (60 mg total) by mouth 2 (two) times daily. And additional 20mg  if needed for a 3rd dose  FOLLOW-UP: 5 days   The above assessment and management plan was discussed with the patient. The patient verbalized understanding of and has agreed to the management plan. Patient is aware to call the clinic if symptoms persist or worsen. Patient is aware when to return to the clinic for a follow-up visit. Patient educated on when it is appropriate to go to the emergency department.    I provided 30 minutes of non-face-to-face time during this encounter.  Rutherford Guys, MD Primary Care at [facility] [place], [place] [address] Ph.  [phone] Fax [phone]

## 2019-02-04 LAB — IRON,TIBC AND FERRITIN PANEL
Ferritin: 239 ng/mL — ABNORMAL HIGH (ref 15–150)
Iron Saturation: 14 % — ABNORMAL LOW (ref 15–55)
Iron: 39 ug/dL (ref 27–139)
Total Iron Binding Capacity: 285 ug/dL (ref 250–450)
UIBC: 246 ug/dL (ref 118–369)

## 2019-02-04 LAB — LIPID PANEL
Chol/HDL Ratio: 2.2 ratio (ref 0.0–4.4)
Cholesterol, Total: 82 mg/dL — ABNORMAL LOW (ref 100–199)
HDL: 37 mg/dL — ABNORMAL LOW (ref >39)
LDL Calculated: 25 mg/dL (ref 0–99)
Triglycerides: 101 mg/dL (ref 0–149)
VLDL Cholesterol Cal: 20 mg/dL (ref 5–40)

## 2019-02-04 LAB — COMPREHENSIVE METABOLIC PANEL
ALT: 7 IU/L (ref 0–32)
AST: 19 IU/L (ref 0–40)
Albumin/Globulin Ratio: 1.8 (ref 1.2–2.2)
Albumin: 4.2 g/dL (ref 3.8–4.8)
Alkaline Phosphatase: 65 IU/L (ref 39–117)
BUN/Creatinine Ratio: 37 — ABNORMAL HIGH (ref 12–28)
BUN: 65 mg/dL — ABNORMAL HIGH (ref 8–27)
Bilirubin Total: 0.3 mg/dL (ref 0.0–1.2)
CO2: 25 mmol/L (ref 20–29)
Calcium: 9.3 mg/dL (ref 8.7–10.3)
Chloride: 98 mmol/L (ref 96–106)
Creatinine, Ser: 1.74 mg/dL — ABNORMAL HIGH (ref 0.57–1.00)
GFR calc Af Amer: 35 mL/min/{1.73_m2} — ABNORMAL LOW (ref >59)
GFR calc non Af Amer: 31 mL/min/{1.73_m2} — ABNORMAL LOW (ref >59)
Globulin, Total: 2.3 g/dL (ref 1.5–4.5)
Glucose: 123 mg/dL — ABNORMAL HIGH (ref 65–99)
Potassium: 4.5 mmol/L (ref 3.5–5.2)
Sodium: 141 mmol/L (ref 134–144)
Total Protein: 6.5 g/dL (ref 6.0–8.5)

## 2019-02-04 LAB — CBC
Hematocrit: 33.4 % — ABNORMAL LOW (ref 34.0–46.6)
Hemoglobin: 10.7 g/dL — ABNORMAL LOW (ref 11.1–15.9)
MCH: 28 pg (ref 26.6–33.0)
MCHC: 32 g/dL (ref 31.5–35.7)
MCV: 87 fL (ref 79–97)
Platelets: 177 10*3/uL (ref 150–450)
RBC: 3.82 x10E6/uL (ref 3.77–5.28)
RDW: 13 % (ref 11.7–15.4)
WBC: 5.2 10*3/uL (ref 3.4–10.8)

## 2019-02-04 LAB — HEMOGLOBIN A1C
Est. average glucose Bld gHb Est-mCnc: 114 mg/dL
Hgb A1c MFr Bld: 5.6 % (ref 4.8–5.6)

## 2019-02-04 LAB — MICROALBUMIN / CREATININE URINE RATIO
Creatinine, Urine: 64.7 mg/dL
Microalb/Creat Ratio: 43 mg/g creat — ABNORMAL HIGH (ref 0–29)
Microalbumin, Urine: 27.9 ug/mL

## 2019-02-04 MED ORDER — TORSEMIDE 20 MG PO TABS: 60.0000 mg | ORAL_TABLET | Freq: Two times a day (BID) | ORAL | 1 refills | Status: DC

## 2019-02-04 NOTE — Progress Notes (Signed)
That will be great!  Thanks

## 2019-02-08 ENCOUNTER — Telehealth: Payer: Self-pay

## 2019-02-08 ENCOUNTER — Other Ambulatory Visit: Payer: Self-pay | Admitting: Cardiovascular Disease

## 2019-02-08 ENCOUNTER — Telehealth: Payer: Self-pay | Admitting: Family Medicine

## 2019-02-08 ENCOUNTER — Telehealth (INDEPENDENT_AMBULATORY_CARE_PROVIDER_SITE_OTHER): Payer: PPO | Admitting: Family Medicine

## 2019-02-08 ENCOUNTER — Other Ambulatory Visit: Payer: Self-pay

## 2019-02-08 DIAGNOSIS — I5023 Acute on chronic systolic (congestive) heart failure: Secondary | ICD-10-CM

## 2019-02-08 MED ORDER — TORSEMIDE 20 MG PO TABS: 80.0000 mg | ORAL_TABLET | Freq: Two times a day (BID) | ORAL | 1 refills | Status: DC

## 2019-02-08 NOTE — Telephone Encounter (Signed)
Copied from [place] [id number]. Topic: Quick Communication - Rx Refill/Question >> Feb 08, 2019 12:59 PM Rainey Pines A wrote: Medication: torsemide (DEMADEX) 20 MG tablet  Has the patient contacted their pharmacy? Yes (Agent: If no, request that the patient contact the pharmacy for the refill.) (Agent: If yes, when and what did the pharmacy advise?)Contact PCP  Preferred Pharmacy (with phone number or street name): [facility], [place] [facility] [place] [phone] (Phone) [phone] (Fax)    Agent: Please be advised that RX refills may take up to 3 business days. We ask that you follow-up with your pharmacy.

## 2019-02-08 NOTE — Telephone Encounter (Signed)

## 2019-02-08 NOTE — Progress Notes (Signed)
Virtual Visit Note  I connected with patient on 02/08/19 at 1132 am by phone and verified that I am speaking with the correct person using two identifiers. [name] is currently located at home and patient is currently with them during visit. The provider, Rutherford Guys, MD is located in their office at time of visit.  I discussed the limitations, risks, security and privacy concerns of performing an evaluation and management service by telephone and the availability of in person appointments. I also discussed with the patient that there may be a patient responsible charge related to this service. The patient expressed understanding and agreed to proceed.   CC: edema  HPI ? 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,  MSSA bacteremia 2/2 osteomyelitis s/p amputation,systolicCHF, OSA on cpap, DM2 on insulin, CKD, HTN, HLP, anemia here for followup on fluid status  Last OV patient c/o ssx of fluid overload toresemide increased to 60am and 40pm Last OV 307 lbs She was weighing 285 lbs in March 2020 Patient reports that she has been as low as 269lb Yesterday was down to 306 lb but today 309 lbs No improvement in SOB during the day, has been back on her cpap at night which has helped when she sleeps Edema unchanged, she reports pitting, feels very tight across her knees and abdomen Has not noticed any significant increased in urine output with increased medication  No Known Allergies  Prior to Admission medications   Medication Sig Start Date End Date Taking? Authorizing Provider  aspirin EC 81 MG EC tablet Take 1 tablet (81 mg total) by mouth daily. 11/24/18   Epifanio Lesches, MD  CALCIUM PO Take 1 tablet by mouth 2 (two) times daily.    [provider]  carvedilol (COREG) 6.25 MG tablet Take 1 tablet (6.25 mg total) by mouth 2 (two) times daily with a meal. 12/16/18   Gladstone Lighter, MD  clopidogrel (PLAVIX) 75 MG tablet Take 1 tablet (75 mg total) by mouth daily. 12/24/18   Rutherford Guys, MD  Ferrous Sulfate (IRON PO) Take 325 mg by mouth 2 (two) times daily.    [provider]  gabapentin (NEURONTIN) 600 MG tablet Take 1 tablet (600 mg total) by mouth 2 (two) times daily. Takes 1 in the morning and two at bedtime 12/24/18   Rutherford Guys, MD  glimepiride (AMARYL) 4 MG tablet Take 4 mg by mouth 2 (two) times daily.    [provider]  glucose blood (ONE TOUCH ULTRA TEST) test strip USE AS DIRECTED TO CHECK BLOOD SUGAR 3 TIMES DAILY 01/09/19   Rutherford Guys, MD  insulin glargine (LANTUS) 100 UNIT/ML injection Inject 0.16 mLs (16 Units total) into the skin at bedtime. 11/24/18   Epifanio Lesches, MD  losartan (COZAAR) 50 MG tablet Take 0.5 tablets (25 mg total) by mouth daily. 01/01/19   Alisa Graff, FNP  Multiple Vitamin (MULTIVITAMIN) tablet Take 1 tablet by mouth daily.    [provider]  Omega-3 Fatty Acids (FISH OIL PO) Take 1 capsule by mouth 2 (two) times a day.     [provider]  potassium chloride (K-DUR,KLOR-CON) 10 MEQ tablet Take 1 tablet (10 mEq total) by mouth as needed. On days you take the 3rd torsemide dose 01/01/19   Darylene Price A, FNP  torsemide (DEMADEX) 20 MG tablet Take 3 tablets (60 mg total) by mouth 2 (two) times daily.  And additional 20mg  if needed for a 3rd dose 02/04/19   Rutherford Guys, MD  traMADol (ULTRAM) 50 MG tablet Take 1 tablet (50 mg total) by mouth every 6 (six) hours as needed. 01/07/19   Rutherford Guys, MD    Past Medical History:  Diagnosis Date  . Acute myocardial infarction, subendocardial infarction, initial episode of care ([place]) 07/21/2012  . Acute osteomyelitis involving ankle and foot ([place]) 02/06/2015  . Acute systolic heart failure ([place]) 07/21/2012   New onset 07/19/12; admission to [facility] ED. Elevated Troponins.  S/p 2D-echo with EF 20-25%.  S/p cardiac  catheterization with stenting LAD.  Repeat 2D-echo 10/2011 with improved EF of 35%.   . Anemia   . Automatic implantable cardioverter-defibrillator in situ    a. MDT CRT-D 06/2014, SN: [id number]  .removed in feb 2020  . CAD (coronary artery disease)    a. cardiac cath 101/04/2012: PCI/DES to chronically occluded mLAD, consideration PCI to diag branch in 4 weeks.   . Cataract   . Chicken pox   . Chronic kidney disease   . Chronic systolic CHF (congestive heart failure) (HCC)    a. mixed ICM & NICM; b. EF 20-25% by echo 07/2012, mid-dist 2/3 of LV sev HK/AK, mild MR. echo 10/2012: EF 30-35%, sev HK ant-septal & inf walls, GR1DD, mild MR, PASP 33. c. echo 02/2013: EF 30%, GR1DD, mild MR. echo 04/2014: EF 30%, Septal-lat dyssynchrony, global HK, inf AK, GR1DD, mild MR. d. echo 10/2014: EF50-55%, WM nl, GR1DD, septal mild paradox. e. echo 02/2015: EF 50-55%, wm   . Depression   . Heart attack ([place])   . Heel ulcer ([place]) 04/27/2015  . History of blood transfusion ~ 2011   "plasma; had neuropathy; couldn't walk"  . Hypertension   . LBBB (left bundle branch block)   . Neuromuscular disorder ([place])   . Neuropathy 2011  . Obesity, unspecified   . OSA on CPAP    Moderate with AHI 23/hr and now on CPAP at 16cm H2O.  Her DME is [facility]  . Pure hypercholesterolemia   . Sepsis ([place]) 09/2018  . Type II diabetes mellitus ([place])   . Unspecified vitamin D deficiency     Past Surgical History:  Procedure Laterality Date  . AMPUTATION TOE Right 06/18/2015   Procedure: AMPUTATION TOE;  Surgeon: Samara Deist, DPM;  Location: [facility] ORS;  Service: Podiatry;  Laterality: Right;  . AMPUTATION TOE Left 10/08/2018   Procedure: AMPUTATION TOE LEFT 2ND;  Surgeon: Albertine Patricia, DPM;  Location: [facility] ORS;  Service: Podiatry;  Laterality: Left;  . BACK SURGERY    . BI-VENTRICULAR IMPLANTABLE CARDIOVERTER DEFIBRILLATOR N/A 07/06/2014   Procedure: BI-VENTRICULAR IMPLANTABLE CARDIOVERTER DEFIBRILLATOR  (CRT-D);  Surgeon: Deboraha Sprang, MD;  Location: [facility] CATH LAB;  Service: Cardiovascular;  Laterality: N/A;  . BI-VENTRICULAR IMPLANTABLE CARDIOVERTER DEFIBRILLATOR  (CRT-D)  07/06/2014  . BILATERAL OOPHORECTOMY  01/2011   ovarian cyst benign  . COLONOSCOPY WITH PROPOFOL Left 02/22/2015   Procedure: COLONOSCOPY WITH PROPOFOL;  Surgeon: Hulen Luster, MD;  Location[facility] ENDOSCOPY;  Service: Endoscopy;  Laterality: Left;  . CORONARY ANGIOPLASTY WITH STENT PLACEMENT Left 07/2012   new onset systolic CHF; elevated troponins.  Cardiac catheterization with stenting to LAD; EF 15%.  2D-echo: EF 20-25%.  . ESOPHAGOGASTRODUODENOSCOPY N/A 02/22/2015   Procedure: ESOPHAGOGASTRODUODENOSCOPY (EGD);  Surgeon: Hulen Luster, MD;  Location: [facility] ENDOSCOPY;  Service: Endoscopy;  Laterality: N/A;  . INCISION AND DRAINAGE ABSCESS Right 2007   groin; with  ICU stay due to sepsis.  Marland Kitchen LAPAROSCOPIC CHOLECYSTECTOMY  2011  . LEFT HEART CATH AND CORONARY ANGIOGRAPHY N/A 10/05/2018   Procedure: LEFT HEART CATH AND CORONARY ANGIOGRAPHY;  Surgeon: Minna Merritts, MD;  Location: [place] CV LAB;  Service: Cardiovascular;  Laterality: N/A;  . LEFT HEART CATHETERIZATION WITH CORONARY ANGIOGRAM N/A 07/21/2012   Procedure: LEFT HEART CATHETERIZATION WITH CORONARY ANGIOGRAM;  Surgeon: Jolaine Artist, MD;  Location: [facility] CATH LAB;  Service: Cardiovascular;  Laterality: N/A;  . [place]   L4-5  . PACEMAKER REMOVAL  11/2018   due to infection around pacemaker  . PERCUTANEOUS CORONARY STENT INTERVENTION (PCI-S) N/A 07/23/2012   Procedure: PERCUTANEOUS CORONARY STENT INTERVENTION (PCI-S);  Surgeon: Sherren Mocha, MD;  Location: [facility] CATH LAB;  Service: Cardiovascular;  Laterality: N/A;  . PERIPHERAL VASCULAR BALLOON ANGIOPLASTY Left 10/06/2018   Procedure: PERIPHERAL VASCULAR BALLOON ANGIOPLASTY;  Surgeon: Katha Cabal, MD;  Location: [place] CV LAB;  Service: Cardiovascular;  Laterality: Left;  . PERIPHERAL VASCULAR CATHETERIZATION  N/A 02/10/2015   Procedure: Picc Line Insertion;  Surgeon: Katha Cabal, MD;  Location: [place] CV LAB;  Service: Cardiovascular;  Laterality: N/A;  . TEE WITHOUT CARDIOVERSION N/A 10/02/2018   Procedure: TRANSESOPHAGEAL ECHOCARDIOGRAM (TEE);  Surgeon: Wellington Hampshire, MD;  Location: [facility] ORS;  Service: Cardiovascular;  Laterality: N/A;  . [place]  . VAGINAL HYSTERECTOMY  01/2011   Fibroids/DUB.  Ovaries removed. [place].    Social History   Tobacco Use  . Smoking status: Never Smoker  . Smokeless tobacco: Never Used  Substance Use Topics  . Alcohol use: No    Alcohol/week: 0.0 standard drinks    Family History  Problem Relation Age of Onset  . Diabetes Mother   . Hypertension Mother   . Arthritis Mother        knees, lumbar DDD, cervical DDD  . Cancer Father        prostate,skin,lymphoma.  . Cancer Brother 52       bladder cancer; non-smoker  . Diabetes Maternal Grandmother   . Heart disease Maternal Grandmother   . COPD Maternal Grandmother   . Diabetes Paternal Grandmother   . Obesity Brother   . Diabetes Son   . Hypertension Son   . Cancer Maternal Grandfather   . Diabetes Paternal Grandfather     ROS  Per hpi  Objective  Vitals as reported by the patient: see above  Lab Results  Component Value Date   CREATININE 1.74 (H) 02/03/2019   BUN 65 (H) 02/03/2019   NA 141 02/03/2019   K 4.5 02/03/2019   CL 98 02/03/2019   CO2 25 02/03/2019    ASSESSMENT and PLAN  1. Acute on chronic systolic CHF (congestive heart failure) (HCC) - Comprehensive metabolic panel; Future Increase toresemide to 80mg  BID Labs this Thursday Followup next week with CHF clinic RTC precautions  FOLLOW-UP: 2 weeks   The above assessment and management plan was discussed with the patient. The patient verbalized understanding of and has agreed to the management plan. Patient is aware to call the clinic if symptoms persist or worsen. Patient is aware when to  return to the clinic for a follow-up visit. Patient educated on when it is appropriate to go to the emergency department.    I provided 10 minutes of non-face-to-face time during this encounter.  Rutherford Guys, MD Primary Care at [facility] [place], [place] [address] Ph.  [phone] Fax  [phone]   

## 2019-02-08 NOTE — Progress Notes (Signed)
Follow up on the fluid in the stomach and legs, says the torsemide is not working at all for the edema. Says there was change when she began to take the meds, did lose 3 lbs, fluid weight has increased. No pain

## 2019-02-08 NOTE — Telephone Encounter (Signed)
Was filled today

## 2019-02-09 NOTE — Telephone Encounter (Signed)
A 30-day prescription was sent in on 02/08/19 to [facility].

## 2019-02-09 NOTE — Telephone Encounter (Signed)
Pt OD for f/u appointment. Please advise if ok to refill Crestor 40 mg tablet. Pt last seen TG in 2018 has upcoming appointment with Caryl Comes and Fletcher Anon.

## 2019-02-10 ENCOUNTER — Telehealth: Payer: Self-pay | Admitting: Family Medicine

## 2019-02-10 ENCOUNTER — Telehealth: Payer: Self-pay

## 2019-02-10 NOTE — Telephone Encounter (Signed)
Verbal approved

## 2019-02-10 NOTE — Telephone Encounter (Signed)
Copied from [place] [id number]. Topic: Quick Communication - Home Health Verbal Orders >> Feb 10, 2019  4:36 PM Vernona Rieger wrote: Caller/Agency: Davy Pique, [place] Number: [phone] Requesting OT/PT/Skilled Nursing/Social Work/Speech Therapy: Nursing  Frequency: 2 week 1, 1 week 3, 1 every other week for 5 weeks and 2 PRN visits

## 2019-02-10 NOTE — Telephone Encounter (Signed)
Spoke with [name] with home health approve verbal orders. Noting that patient water weight is 308lb due to fluid

## 2019-02-11 ENCOUNTER — Other Ambulatory Visit: Payer: Self-pay

## 2019-02-11 ENCOUNTER — Telehealth: Payer: Self-pay | Admitting: Internal Medicine

## 2019-02-11 ENCOUNTER — Ambulatory Visit (INDEPENDENT_AMBULATORY_CARE_PROVIDER_SITE_OTHER): Payer: PPO | Admitting: Family Medicine

## 2019-02-11 DIAGNOSIS — I5023 Acute on chronic systolic (congestive) heart failure: Secondary | ICD-10-CM

## 2019-02-11 NOTE — Telephone Encounter (Signed)
LMOVM for pt to return call 

## 2019-02-11 NOTE — Telephone Encounter (Signed)
New message   Patient states that she keeps getting letters stating that have not received a transmission. Patient states that she does not have a device. Please call the patient.

## 2019-02-12 LAB — COMPREHENSIVE METABOLIC PANEL
ALT: 8 IU/L (ref 0–32)
AST: 15 IU/L (ref 0–40)
Albumin/Globulin Ratio: 1.8 (ref 1.2–2.2)
Albumin: 3.9 g/dL (ref 3.8–4.8)
Alkaline Phosphatase: 64 IU/L (ref 39–117)
BUN/Creatinine Ratio: 25 (ref 12–28)
BUN: 51 mg/dL — ABNORMAL HIGH (ref 8–27)
Bilirubin Total: 0.4 mg/dL (ref 0.0–1.2)
CO2: 27 mmol/L (ref 20–29)
Calcium: 9.3 mg/dL (ref 8.7–10.3)
Chloride: 95 mmol/L — ABNORMAL LOW (ref 96–106)
Creatinine, Ser: 2.05 mg/dL — ABNORMAL HIGH (ref 0.57–1.00)
GFR calc Af Amer: 29 mL/min/{1.73_m2} — ABNORMAL LOW (ref >59)
GFR calc non Af Amer: 25 mL/min/{1.73_m2} — ABNORMAL LOW (ref >59)
Globulin, Total: 2.2 g/dL (ref 1.5–4.5)
Glucose: 99 mg/dL (ref 65–99)
Potassium: 4.6 mmol/L (ref 3.5–5.2)
Sodium: 138 mmol/L (ref 134–144)
Total Protein: 6.1 g/dL (ref 6.0–8.5)

## 2019-02-12 NOTE — Telephone Encounter (Signed)
2nd attempt  LMOVM for pt to return.

## 2019-02-15 NOTE — Telephone Encounter (Signed)
Follow up  ° ° °Patient is returning your call. °

## 2019-02-15 NOTE — Telephone Encounter (Signed)
3rd attempt  LMOVM for pt to return.

## 2019-02-15 NOTE — Telephone Encounter (Signed)
Pt states he ICD was taking out because she developed an infection and the infection was on her leads. I told the pt I will updated her Chart.

## 2019-02-16 ENCOUNTER — Emergency Department: Payer: PPO

## 2019-02-16 ENCOUNTER — Inpatient Hospital Stay
Admission: EM | Admit: 2019-02-16 | Discharge: 2019-02-23 | DRG: 291 | Disposition: A | Discharge status: Home Health | Payer: PPO | Attending: Specialist | Admitting: Specialist

## 2019-02-16 ENCOUNTER — Other Ambulatory Visit: Payer: Self-pay

## 2019-02-16 DIAGNOSIS — I252 Old myocardial infarction: Secondary | ICD-10-CM | POA: Diagnosis not present

## 2019-02-16 DIAGNOSIS — Z794 Long term (current) use of insulin: Secondary | ICD-10-CM | POA: Diagnosis not present

## 2019-02-16 DIAGNOSIS — Z66 Do not resuscitate: Secondary | ICD-10-CM | POA: Diagnosis not present

## 2019-02-16 DIAGNOSIS — Z89421 Acquired absence of other right toe(s): Secondary | ICD-10-CM

## 2019-02-16 DIAGNOSIS — Z1159 Encounter for screening for other viral diseases: Secondary | ICD-10-CM

## 2019-02-16 DIAGNOSIS — E876 Hypokalemia: Secondary | ICD-10-CM | POA: Diagnosis present

## 2019-02-16 DIAGNOSIS — Z6841 Body Mass Index (BMI) 40.0 and over, adult: Secondary | ICD-10-CM | POA: Diagnosis not present

## 2019-02-16 DIAGNOSIS — Z9049 Acquired absence of other specified parts of digestive tract: Secondary | ICD-10-CM

## 2019-02-16 DIAGNOSIS — I471 Supraventricular tachycardia: Secondary | ICD-10-CM | POA: Diagnosis not present

## 2019-02-16 DIAGNOSIS — E7800 Pure hypercholesterolemia, unspecified: Secondary | ICD-10-CM | POA: Diagnosis present

## 2019-02-16 DIAGNOSIS — Z7982 Long term (current) use of aspirin: Secondary | ICD-10-CM | POA: Diagnosis not present

## 2019-02-16 DIAGNOSIS — R0902 Hypoxemia: Secondary | ICD-10-CM

## 2019-02-16 DIAGNOSIS — I130 Hypertensive heart and chronic kidney disease with heart failure and stage 1 through stage 4 chronic kidney disease, or unspecified chronic kidney disease: Principal | ICD-10-CM | POA: Diagnosis present

## 2019-02-16 DIAGNOSIS — I5043 Acute on chronic combined systolic (congestive) and diastolic (congestive) heart failure: Secondary | ICD-10-CM

## 2019-02-16 DIAGNOSIS — I959 Hypotension, unspecified: Secondary | ICD-10-CM | POA: Diagnosis not present

## 2019-02-16 DIAGNOSIS — Z90710 Acquired absence of both cervix and uterus: Secondary | ICD-10-CM | POA: Diagnosis not present

## 2019-02-16 DIAGNOSIS — E1142 Type 2 diabetes mellitus with diabetic polyneuropathy: Secondary | ICD-10-CM | POA: Diagnosis not present

## 2019-02-16 DIAGNOSIS — R05 Cough: Secondary | ICD-10-CM | POA: Diagnosis not present

## 2019-02-16 DIAGNOSIS — I4581 Long QT syndrome: Secondary | ICD-10-CM | POA: Diagnosis not present

## 2019-02-16 DIAGNOSIS — Z833 Family history of diabetes mellitus: Secondary | ICD-10-CM

## 2019-02-16 DIAGNOSIS — G4733 Obstructive sleep apnea (adult) (pediatric): Secondary | ICD-10-CM

## 2019-02-16 DIAGNOSIS — Z79899 Other long term (current) drug therapy: Secondary | ICD-10-CM | POA: Diagnosis not present

## 2019-02-16 DIAGNOSIS — F329 Major depressive disorder, single episode, unspecified: Secondary | ICD-10-CM | POA: Diagnosis not present

## 2019-02-16 DIAGNOSIS — I2510 Atherosclerotic heart disease of native coronary artery without angina pectoris: Secondary | ICD-10-CM | POA: Diagnosis not present

## 2019-02-16 DIAGNOSIS — R0600 Dyspnea, unspecified: Secondary | ICD-10-CM | POA: Diagnosis not present

## 2019-02-16 DIAGNOSIS — I509 Heart failure, unspecified: Secondary | ICD-10-CM | POA: Diagnosis not present

## 2019-02-16 DIAGNOSIS — E1122 Type 2 diabetes mellitus with diabetic chronic kidney disease: Secondary | ICD-10-CM | POA: Diagnosis not present

## 2019-02-16 DIAGNOSIS — I4719 Other supraventricular tachycardia: Secondary | ICD-10-CM

## 2019-02-16 DIAGNOSIS — Z955 Presence of coronary angioplasty implant and graft: Secondary | ICD-10-CM

## 2019-02-16 DIAGNOSIS — D649 Anemia, unspecified: Secondary | ICD-10-CM | POA: Diagnosis present

## 2019-02-16 DIAGNOSIS — I110 Hypertensive heart disease with heart failure: Secondary | ICD-10-CM | POA: Diagnosis not present

## 2019-02-16 DIAGNOSIS — Z7902 Long term (current) use of antithrombotics/antiplatelets: Secondary | ICD-10-CM | POA: Diagnosis not present

## 2019-02-16 DIAGNOSIS — I2582 Chronic total occlusion of coronary artery: Secondary | ICD-10-CM | POA: Diagnosis present

## 2019-02-16 DIAGNOSIS — I5023 Acute on chronic systolic (congestive) heart failure: Secondary | ICD-10-CM

## 2019-02-16 DIAGNOSIS — N183 Chronic kidney disease, stage 3 (moderate): Secondary | ICD-10-CM | POA: Diagnosis present

## 2019-02-16 DIAGNOSIS — N179 Acute kidney failure, unspecified: Secondary | ICD-10-CM | POA: Diagnosis not present

## 2019-02-16 DIAGNOSIS — R0602 Shortness of breath: Secondary | ICD-10-CM

## 2019-02-16 DIAGNOSIS — N184 Chronic kidney disease, stage 4 (severe): Secondary | ICD-10-CM | POA: Diagnosis not present

## 2019-02-16 DIAGNOSIS — N189 Chronic kidney disease, unspecified: Secondary | ICD-10-CM

## 2019-02-16 DIAGNOSIS — Z89422 Acquired absence of other left toe(s): Secondary | ICD-10-CM

## 2019-02-16 DIAGNOSIS — I255 Ischemic cardiomyopathy: Secondary | ICD-10-CM | POA: Diagnosis present

## 2019-02-16 DIAGNOSIS — J90 Pleural effusion, not elsewhere classified: Secondary | ICD-10-CM | POA: Diagnosis not present

## 2019-02-16 DIAGNOSIS — I38 Endocarditis, valve unspecified: Secondary | ICD-10-CM | POA: Diagnosis not present

## 2019-02-16 DIAGNOSIS — Z950 Presence of cardiac pacemaker: Secondary | ICD-10-CM

## 2019-02-16 DIAGNOSIS — E1140 Type 2 diabetes mellitus with diabetic neuropathy, unspecified: Secondary | ICD-10-CM | POA: Diagnosis present

## 2019-02-16 DIAGNOSIS — E1151 Type 2 diabetes mellitus with diabetic peripheral angiopathy without gangrene: Secondary | ICD-10-CM | POA: Diagnosis present

## 2019-02-16 DIAGNOSIS — E669 Obesity, unspecified: Secondary | ICD-10-CM | POA: Diagnosis not present

## 2019-02-16 DIAGNOSIS — R059 Cough, unspecified: Secondary | ICD-10-CM

## 2019-02-16 DIAGNOSIS — E785 Hyperlipidemia, unspecified: Secondary | ICD-10-CM | POA: Diagnosis present

## 2019-02-16 DIAGNOSIS — I5022 Chronic systolic (congestive) heart failure: Secondary | ICD-10-CM

## 2019-02-16 LAB — COMPREHENSIVE METABOLIC PANEL
ALT: 7 U/L (ref 0–44)
AST: 18 U/L (ref 15–41)
Albumin: 4 g/dL (ref 3.5–5.0)
Alkaline Phosphatase: 66 U/L (ref 38–126)
Anion gap: 14 (ref 5–15)
BUN: 51 mg/dL — ABNORMAL HIGH (ref 8–23)
CO2: 29 mmol/L (ref 22–32)
Calcium: 9.2 mg/dL (ref 8.9–10.3)
Chloride: 95 mmol/L — ABNORMAL LOW (ref 98–111)
Creatinine, Ser: 2.03 mg/dL — ABNORMAL HIGH (ref 0.44–1.00)
GFR calc Af Amer: 29 mL/min — ABNORMAL LOW (ref >60)
GFR calc non Af Amer: 25 mL/min — ABNORMAL LOW (ref >60)
Glucose, Bld: 171 mg/dL — ABNORMAL HIGH (ref 70–99)
Potassium: 3.8 mmol/L (ref 3.5–5.1)
Sodium: 138 mmol/L (ref 135–145)
Total Bilirubin: 0.6 mg/dL (ref 0.3–1.2)
Total Protein: 7.7 g/dL (ref 6.5–8.1)

## 2019-02-16 LAB — GLUCOSE, CAPILLARY
Glucose-Capillary: 118 mg/dL — ABNORMAL HIGH (ref 70–99)
Glucose-Capillary: 174 mg/dL — ABNORMAL HIGH (ref 70–99)

## 2019-02-16 LAB — CBC
HCT: 36.4 % (ref 36.0–46.0)
Hemoglobin: 11.3 g/dL — ABNORMAL LOW (ref 12.0–15.0)
MCH: 27.7 pg (ref 26.0–34.0)
MCHC: 31 g/dL (ref 30.0–36.0)
MCV: 89.2 fL (ref 80.0–100.0)
Platelets: 191 10*3/uL (ref 150–400)
RBC: 4.08 MIL/uL (ref 3.87–5.11)
RDW: 14.4 % (ref 11.5–15.5)
WBC: 6.4 10*3/uL (ref 4.0–10.5)
nRBC: 0 % (ref 0.0–0.2)

## 2019-02-16 LAB — TROPONIN I: Troponin I: <0.03 ng/mL (ref <0.03)

## 2019-02-16 LAB — BRAIN NATRIURETIC PEPTIDE: B Natriuretic Peptide: 1289 pg/mL — ABNORMAL HIGH (ref 0.0–100.0)

## 2019-02-16 LAB — SARS CORONAVIRUS 2 BY RT PCR (HOSPITAL ORDER, PERFORMED IN ~~LOC~~ HOSPITAL LAB): SARS Coronavirus 2: NEGATIVE

## 2019-02-16 MED ORDER — CARVEDILOL 6.25 MG PO TABS
6.2500 mg | ORAL_TABLET | Freq: Two times a day (BID) | ORAL | Status: DC
  Administered 2019-02-16 – 2019-02-18 (×4): 6.25 mg via ORAL
  Filled 2019-02-16 (×4): qty 1

## 2019-02-16 MED ORDER — SODIUM CHLORIDE 0.9% FLUSH
3.0000 mL | Freq: Two times a day (BID) | INTRAVENOUS | Status: DC
  Administered 2019-02-16 – 2019-02-23 (×15): 3 mL via INTRAVENOUS

## 2019-02-16 MED ORDER — SODIUM CHLORIDE 0.9 % IV SOLN: 250.0000 mL | INTRAVENOUS | Status: DC | PRN

## 2019-02-16 MED ORDER — ASPIRIN EC 81 MG PO TBEC
81.0000 mg | DELAYED_RELEASE_TABLET | Freq: Every day | ORAL | Status: DC
  Administered 2019-02-17 – 2019-02-23 (×7): 81 mg via ORAL
  Filled 2019-02-16 (×7): qty 1

## 2019-02-16 MED ORDER — CALCIUM CARBONATE-VITAMIN D 500-200 MG-UNIT PO TABS
1.0000 | ORAL_TABLET | Freq: Two times a day (BID) | ORAL | Status: DC
  Administered 2019-02-16 – 2019-02-23 (×14): 1 via ORAL
  Filled 2019-02-16 (×14): qty 1

## 2019-02-16 MED ORDER — FUROSEMIDE 10 MG/ML IJ SOLN
60.0000 mg | Freq: Two times a day (BID) | INTRAMUSCULAR | Status: DC
  Filled 2019-02-16: qty 6

## 2019-02-16 MED ORDER — OMEGA-3-ACID ETHYL ESTERS 1 G PO CAPS
1.0000 g | ORAL_CAPSULE | Freq: Every day | ORAL | Status: DC
  Administered 2019-02-17 – 2019-02-23 (×7): 1 g via ORAL
  Filled 2019-02-16 (×7): qty 1

## 2019-02-16 MED ORDER — ROSUVASTATIN CALCIUM 10 MG PO TABS
40.0000 mg | ORAL_TABLET | Freq: Every day | ORAL | Status: DC
  Administered 2019-02-16 – 2019-02-23 (×8): 40 mg via ORAL
  Filled 2019-02-16 (×8): qty 4

## 2019-02-16 MED ORDER — ACETAMINOPHEN 325 MG PO TABS: 650.0000 mg | ORAL_TABLET | ORAL | Status: DC | PRN

## 2019-02-16 MED ORDER — GABAPENTIN 600 MG PO TABS
600.0000 mg | ORAL_TABLET | Freq: Every morning | ORAL | Status: DC
  Administered 2019-02-17 – 2019-02-18 (×2): 600 mg via ORAL
  Filled 2019-02-16 (×2): qty 1

## 2019-02-16 MED ORDER — INSULIN ASPART 100 UNIT/ML ~~LOC~~ SOLN
0.0000 [IU] | Freq: Every day | SUBCUTANEOUS | Status: DC
  Administered 2019-02-18 – 2019-02-20 (×3): 2 [IU] via SUBCUTANEOUS
  Administered 2019-02-21 – 2019-02-22 (×2): 3 [IU] via SUBCUTANEOUS
  Filled 2019-02-16 (×5): qty 1

## 2019-02-16 MED ORDER — INSULIN GLARGINE 100 UNIT/ML ~~LOC~~ SOLN
12.0000 [IU] | Freq: Every day | SUBCUTANEOUS | Status: DC
  Administered 2019-02-16 – 2019-02-22 (×7): 12 [IU] via SUBCUTANEOUS
  Filled 2019-02-16 (×8): qty 0.12

## 2019-02-16 MED ORDER — CLOPIDOGREL BISULFATE 75 MG PO TABS
75.0000 mg | ORAL_TABLET | Freq: Every day | ORAL | Status: DC
  Administered 2019-02-17 – 2019-02-23 (×7): 75 mg via ORAL
  Filled 2019-02-16 (×7): qty 1

## 2019-02-16 MED ORDER — FUROSEMIDE 10 MG/ML IJ SOLN
60.0000 mg | Freq: Once | INTRAMUSCULAR | Status: AC
  Administered 2019-02-16: 60 mg via INTRAVENOUS
  Filled 2019-02-16: qty 6

## 2019-02-16 MED ORDER — GABAPENTIN 600 MG PO TABS
1200.0000 mg | ORAL_TABLET | Freq: Every day | ORAL | Status: DC
  Administered 2019-02-16 – 2019-02-17 (×2): 1200 mg via ORAL
  Filled 2019-02-16 (×2): qty 2

## 2019-02-16 MED ORDER — ONDANSETRON HCL 4 MG/2ML IJ SOLN: 4.0000 mg | Freq: Four times a day (QID) | INTRAMUSCULAR | Status: DC | PRN

## 2019-02-16 MED ORDER — SODIUM CHLORIDE 0.9% FLUSH: 3.0000 mL | INTRAVENOUS | Status: DC | PRN

## 2019-02-16 MED ORDER — INSULIN ASPART 100 UNIT/ML ~~LOC~~ SOLN
0.0000 [IU] | Freq: Three times a day (TID) | SUBCUTANEOUS | Status: DC
  Administered 2019-02-17: 1 [IU] via SUBCUTANEOUS
  Administered 2019-02-18: 2 [IU] via SUBCUTANEOUS
  Administered 2019-02-18 – 2019-02-19 (×3): 3 [IU] via SUBCUTANEOUS
  Administered 2019-02-20: 13:00:00 2 [IU] via SUBCUTANEOUS
  Administered 2019-02-20: 3 [IU] via SUBCUTANEOUS
  Administered 2019-02-20 – 2019-02-21 (×3): 2 [IU] via SUBCUTANEOUS
  Administered 2019-02-21: 12:00:00 3 [IU] via SUBCUTANEOUS
  Administered 2019-02-22 (×2): 5 [IU] via SUBCUTANEOUS
  Administered 2019-02-22 – 2019-02-23 (×3): 2 [IU] via SUBCUTANEOUS
  Filled 2019-02-16 (×18): qty 1

## 2019-02-16 MED ORDER — FERROUS SULFATE 325 (65 FE) MG PO TABS
325.0000 mg | ORAL_TABLET | Freq: Two times a day (BID) | ORAL | Status: DC
  Administered 2019-02-16 – 2019-02-23 (×14): 325 mg via ORAL
  Filled 2019-02-16 (×14): qty 1

## 2019-02-16 MED ORDER — TRAMADOL HCL 50 MG PO TABS: 50.0000 mg | ORAL_TABLET | Freq: Four times a day (QID) | ORAL | Status: DC | PRN

## 2019-02-16 MED ORDER — MAGNESIUM SULFATE 2 GM/50ML IV SOLN: 2.0000 g | Freq: Once | INTRAVENOUS | Status: DC

## 2019-02-16 MED ORDER — ADULT MULTIVITAMIN W/MINERALS CH
1.0000 | ORAL_TABLET | Freq: Every day | ORAL | Status: DC
  Administered 2019-02-17 – 2019-02-23 (×7): 1 via ORAL
  Filled 2019-02-16 (×7): qty 1

## 2019-02-16 MED ORDER — HEPARIN SODIUM (PORCINE) 5000 UNIT/ML IJ SOLN
5000.0000 [IU] | Freq: Three times a day (TID) | INTRAMUSCULAR | Status: DC
  Administered 2019-02-16 – 2019-02-23 (×20): 5000 [IU] via SUBCUTANEOUS
  Filled 2019-02-16 (×18): qty 1

## 2019-02-16 NOTE — ED Notes (Signed)
ED TO INPATIENT HANDOFF REPORT  ED Nurse Name and Phone #:    S Name/Age/Gender Van Clines [name] 63 y.o. female Room/Bed: ED30A/ED30A  Code Status   Code Status: Prior  Home/SNF/Other Home Patient oriented to: self, place, time and situation Is this baseline? Yes   Triage Complete: Triage complete  Chief Complaint sob;fluid retention  Triage Note Pt c/o increased fluid retention with SOB over the past week, states she has gained 10lbs in the past 2 days. Pt is in NAD on arrival. States she is currently on torsemide 160mg  daily    Allergies No Known Allergies  Level of Care/Admitting Diagnosis ED Disposition    ED Disposition Condition [facility] Area: [place] [[id number]]  Level of Care: Telemetry [5]  Covid Evaluation: N/A  Diagnosis: Acute on chronic systolic CHF (congestive heart failure) ([place]) [[id number]]  Admitting Physician: Odessa Fleming  Attending Physician: Odessa Fleming  Estimated length of stay: past midnight tomorrow  Certification:: I certify this patient will need inpatient services for at least 2 midnights  PT Class (Do Not Modify): Inpatient [101]  PT Acc Code (Do Not Modify): Private [1]       B Medical/Surgery History Past Medical History:  Diagnosis Date  . Acute myocardial infarction, subendocardial infarction, initial episode of care ([place]) 07/21/2012  . Acute osteomyelitis involving ankle and foot ([place]) 02/06/2015  . Acute systolic heart failure ([place]) 07/21/2012   New onset 07/19/12; admission to [facility] ED. Elevated Troponins.  S/p 2D-echo with EF 20-25%.  S/p cardiac catheterization with stenting LAD.  Repeat 2D-echo 10/2011 with improved EF of 35%.   . Anemia   . Automatic implantable cardioverter-defibrillator in situ    a. MDT CRT-D 06/2014, SN: [id number]  .removed in feb 2020  . CAD (coronary artery disease)    a. cardiac cath 101/04/2012: PCI/DES to chronically occluded mLAD,  consideration PCI to diag branch in 4 weeks.   . Cataract   . Chicken pox   . Chronic kidney disease   . Chronic systolic CHF (congestive heart failure) (HCC)    a. mixed ICM & NICM; b. EF 20-25% by echo 07/2012, mid-dist 2/3 of LV sev HK/AK, mild MR. echo 10/2012: EF 30-35%, sev HK ant-septal & inf walls, GR1DD, mild MR, PASP 33. c. echo 02/2013: EF 30%, GR1DD, mild MR. echo 04/2014: EF 30%, Septal-lat dyssynchrony, global HK, inf AK, GR1DD, mild MR. d. echo 10/2014: EF50-55%, WM nl, GR1DD, septal mild paradox. e. echo 02/2015: EF 50-55%, wm   . Depression   . Heart attack ([place])   . Heel ulcer ([place]) 04/27/2015  . History of blood transfusion ~ 2011   "plasma; had neuropathy; couldn't walk"  . Hypertension   . LBBB (left bundle branch block)   . Neuromuscular disorder ([place])   . Neuropathy 2011  . Obesity, unspecified   . OSA on CPAP    Moderate with AHI 23/hr and now on CPAP at 16cm H2O.  Her DME is [facility]  . Pure hypercholesterolemia   . Sepsis ([place]) 09/2018  . Type II diabetes mellitus ([place])   . Unspecified vitamin D deficiency    Past Surgical History:  Procedure Laterality Date  . AMPUTATION TOE Right 06/18/2015   Procedure: AMPUTATION TOE;  Surgeon: Samara Deist, DPM;  Location: [facility] ORS;  Service: Podiatry;  Laterality: Right;  . AMPUTATION TOE Left 10/08/2018   Procedure: AMPUTATION TOE LEFT 2ND;  Surgeon: Albertine Patricia, DPM;  Location: [facility] ORS;  Service: Podiatry;  Laterality: Left;  . BACK SURGERY    . BI-VENTRICULAR IMPLANTABLE CARDIOVERTER DEFIBRILLATOR N/A 07/06/2014   Procedure: BI-VENTRICULAR IMPLANTABLE CARDIOVERTER DEFIBRILLATOR  (CRT-D);  Surgeon: Deboraha Sprang, MD;  Location: [facility] CATH LAB;  Service: Cardiovascular;  Laterality: N/A;  . BI-VENTRICULAR IMPLANTABLE CARDIOVERTER DEFIBRILLATOR  (CRT-D)  07/06/2014  . BILATERAL OOPHORECTOMY  01/2011   ovarian cyst benign  . COLONOSCOPY WITH PROPOFOL Left 02/22/2015   Procedure: COLONOSCOPY WITH PROPOFOL;  Surgeon: Hulen Luster, MD;   Location: [facility] ENDOSCOPY;  Service: Endoscopy;  Laterality: Left;  . CORONARY ANGIOPLASTY WITH STENT PLACEMENT Left 07/2012   new onset systolic CHF; elevated troponins.  Cardiac catheterization with stenting to LAD; EF 15%.  2D-echo: EF 20-25%.  . ESOPHAGOGASTRODUODENOSCOPY N/A 02/22/2015   Procedure: ESOPHAGOGASTRODUODENOSCOPY (EGD);  Surgeon: Hulen Luster, MD;  Location: [facility] ENDOSCOPY;  Service: Endoscopy;  Laterality: N/A;  . INCISION AND DRAINAGE ABSCESS Right 2007   groin; with ICU stay due to sepsis.  Marland Kitchen LAPAROSCOPIC CHOLECYSTECTOMY  2011  . LEFT HEART CATH AND CORONARY ANGIOGRAPHY N/A 10/05/2018   Procedure: LEFT HEART CATH AND CORONARY ANGIOGRAPHY;  Surgeon: Minna Merritts, MD;  Location: [place] CV LAB;  Service: Cardiovascular;  Laterality: N/A;  . LEFT HEART CATHETERIZATION WITH CORONARY ANGIOGRAM N/A 07/21/2012   Procedure: LEFT HEART CATHETERIZATION WITH CORONARY ANGIOGRAM;  Surgeon: Jolaine Artist, MD;  Location: [facility] CATH LAB;  Service: Cardiovascular;  Laterality: N/A;  . [place]   L4-5  . PACEMAKER REMOVAL  11/2018   due to infection around pacemaker  . PERCUTANEOUS CORONARY STENT INTERVENTION (PCI-S) N/A 07/23/2012   Procedure: PERCUTANEOUS CORONARY STENT INTERVENTION (PCI-S);  Surgeon: Sherren Mocha, MD;  Location: [facility] CATH LAB;  Service: Cardiovascular;  Laterality: N/A;  . PERIPHERAL VASCULAR BALLOON ANGIOPLASTY Left 10/06/2018   Procedure: PERIPHERAL VASCULAR BALLOON ANGIOPLASTY;  Surgeon: Katha Cabal, MD;  Location: [place] [facility] CV LAB;  Service: Cardiovascular;  Laterality: Left;  . PERIPHERAL VASCULAR CATHETERIZATION N/A 02/10/2015   Procedure: Picc Line Insertion;  Surgeon: Katha Cabal, MD;  Location: [facility] CV LAB;  Service: Cardiovascular;  Laterality: N/A;  . TEE WITHOUT CARDIOVERSION N/A 10/02/2018   Procedure: TRANSESOPHAGEAL ECHOCARDIOGRAM (TEE);  Surgeon: Wellington Hampshire, MD;  Location: [facility] ORS;  Service:  Cardiovascular;  Laterality: N/A;  . [place]  . VAGINAL HYSTERECTOMY  01/2011   Fibroids/DUB.  Ovaries removed. [place].     A IV Location/Drains/Wounds Patient Lines/Drains/Airways Status   Active Line/Drains/Airways    Name:   Placement date:   Placement time:   Site:   Days:   Peripheral IV 02/16/19 Right Forearm   02/16/19    1128    Forearm   less than 1   PICC Single Lumen 07/37/10 PICC Right Basilic 41 cm 0 cm   62/69/48    5462    Basilic   63          Intake/Output Last 24 hours No intake or output data in the 24 hours ending 02/16/19 1448  Labs/Imaging Results for orders placed or performed during the hospital encounter of 02/16/19 (from the past 48 hour(s))  CBC     Status: Abnormal   Collection Time: 02/16/19 11:15 AM  Result Value Ref Range   WBC 6.4 4.0 - 10.5 K/uL   RBC 4.08 3.87 - 5.11 MIL/uL   Hemoglobin 11.3 (L) 12.0 - 15.0 g/dL   HCT 36.4 36.0 - 46.0 %   MCV 89.2  80.0 - 100.0 fL   MCH 27.7 26.0 - 34.0 pg   MCHC 31.0 30.0 - 36.0 g/dL   RDW 14.4 11.5 - 15.5 %   Platelets 191 150 - 400 K/uL   nRBC 0.0 0.0 - 0.2 %    Comment: Performed at [facility], [address] [place], [place] [address]  Comprehensive metabolic panel     Status: Abnormal   Collection Time: 02/16/19 11:15 AM  Result Value Ref Range   Sodium 138 135 - 145 mmol/L   Potassium 3.8 3.5 - 5.1 mmol/L   Chloride 95 (L) 98 - 111 mmol/L   CO2 29 22 - 32 mmol/L   Glucose, Bld 171 (H) 70 - 99 mg/dL   BUN 51 (H) 8 - 23 mg/dL   Creatinine, Ser 2.03 (H) 0.44 - 1.00 mg/dL   Calcium 9.2 8.9 - 10.3 mg/dL   Total Protein 7.7 6.5 - 8.1 g/dL   Albumin 4.0 3.5 - 5.0 g/dL   AST 18 15 - 41 U/L   ALT 7 0 - 44 U/L   Alkaline Phosphatase 66 38 - 126 U/L   Total Bilirubin 0.6 0.3 - 1.2 mg/dL   GFR calc non Af Amer 25 (L) >60 mL/min   GFR calc Af Amer 29 (L) >60 mL/min   Anion gap 14 5 - 15    Comment: Performed at [facility], [address] [place], [place]  [address]  Troponin I - ONCE - STAT     Status: None   Collection Time: 02/16/19 11:15 AM  Result Value Ref Range   Troponin I <0.03 <0.03 ng/mL    Comment: Performed at [facility], [address] [place], [place] [address]  Brain natriuretic peptide     Status: Abnormal   Collection Time: 02/16/19 11:15 AM  Result Value Ref Range   B Natriuretic Peptide 1,289.0 (H) 0.0 - 100.0 pg/mL    Comment: Performed at [facility], [address], [place],  [address]  SARS Coronavirus 2 (CEPHEID - Performed in [place] hospital lab), Hosp Order     Status: None   Collection Time: 02/16/19 11:53 AM  Result Value Ref Range   SARS Coronavirus 2 NEGATIVE NEGATIVE    Comment: (NOTE) If result is NEGATIVE SARS-CoV-2 target nucleic acids are NOT DETECTED. The SARS-CoV-2 RNA is generally detectable in upper and lower  respiratory specimens during the acute phase of infection. The lowest  concentration of SARS-CoV-2 viral copies this assay can detect is 250  copies / mL. A negative result does not preclude SARS-CoV-2 infection  and should not be used as the sole basis for treatment or other  patient management decisions.  A negative result may occur with  improper specimen collection / handling, submission of specimen other  than nasopharyngeal swab, presence of viral mutation(s) within the  areas targeted by this assay, and inadequate number of viral copies  (<250 copies / mL). A negative result must be combined with clinical  observations, patient history, and epidemiological information. If result is POSITIVE SARS-CoV-2 target nucleic acids are DETECTED. The SARS-CoV-2 RNA is generally detectable in upper and lower  respiratory specimens dur ing the acute phase of infection.  Positive  results are indicative of active infection with SARS-CoV-2.  Clinical  correlation with patient history and other diagnostic information is  necessary to determine patient infection  status.  Positive results do  not rule out bacterial infection or co-infection with other viruses. If  result is PRESUMPTIVE POSTIVE SARS-CoV-2 nucleic acids MAY BE PRESENT.   A presumptive positive result was obtained on the submitted specimen  and confirmed on repeat testing.  While 2019 novel coronavirus  (SARS-CoV-2) nucleic acids may be present in the submitted sample  additional confirmatory testing may be necessary for epidemiological  and / or clinical management purposes  to differentiate between  SARS-CoV-2 and other Sarbecovirus currently known to infect humans.  If clinically indicated additional testing with an alternate test  methodology [phone]) is advised. The SARS-CoV-2 RNA is generally  detectable in upper and lower respiratory sp ecimens during the acute  phase of infection. The expected result is Negative. Fact Sheet for Patients:  StrictlyIdeas.no Fact Sheet for Healthcare Providers: BankingDealers.co.za This test is not yet approved or cleared by the [place] FDA and has been authorized for detection and/or diagnosis of SARS-CoV-2 by FDA under an Emergency Use Authorization (EUA).  This EUA will remain in effect (meaning this test can be used) for the duration of the COVID-19 declaration under Section 564(b)(1) of the Act, 21 U.S.C. section 360bbb-3(b)(1), unless the authorization is terminated or revoked sooner. Performed at [facility], [address] [place], [place] [address]    Dg Chest Portable 1 View  Result Date: 02/16/2019 CLINICAL DATA:  Increased fluid retention and shortness of breath EXAM: PORTABLE CHEST 1 VIEW COMPARISON:  01/25/2019 FINDINGS: Cardiac shadow is enlarged but stable. Loculated fluid is again seen in the region of the minor fissure increased when compare with the prior exam. Small right pleural effusion is noted inferiorly as well. Mild vascular congestion is again noted.  No focal infiltrate is seen. No bony abnormality is noted. IMPRESSION: Increasing right pleural effusion with increase in more loculated component in the minor fissure. Electronically Signed   By: Inez [name] M.D.   On: 02/16/2019 11:21    Pending Labs [employer] (From admission, onward)    Start     Ordered   Signed and Corporate treasurer  Daily,   R     Signed and Held   Signed and Held  CBC  (heparin)  Once,   R    Comments:  Baseline for heparin therapy IF NOT ALREADY DRAWN.  Notify MD if PLT < 100 K.    Signed and Held   Signed and Held  Creatinine, serum  (heparin)  Once,   R    Comments:  Baseline for heparin therapy IF NOT ALREADY DRAWN.    Signed and Held          Vitals/Pain Today's Vitals   02/16/19 1046 02/16/19 1337  BP: 119/70 122/64  Pulse: 94 99  Resp: 18 17  Temp: 97.6 F (36.4 C) 98.3 F (36.8 C)  TempSrc: Oral Oral  SpO2: 95% 96%  Weight: (!) 145.2 kg   Height: 5\' 8"  (1.727 m)   PainSc: 0-No pain 0-No pain    Isolation Precautions No active isolations  Medications Medications  furosemide (LASIX) injection 60 mg (60 mg Intravenous Given 02/16/19 1336)    Mobility walks Low fall risk   Focused Assessments Pulmonary Assessment Handoff:  Lung sounds:   O2 Device: Nasal Cannula O2 Flow Rate (L/min): 2 L/min      R Recommendations: See Admitting Provider Note  Report given to:   Additional Notes:

## 2019-02-16 NOTE — H&P (Signed)
[facility] [place] NAME: [name]    MR#:  [id number]  DATE OF BIRTH:  [birth year]  DATE OF ADMISSION:  02/16/2019  PRIMARY CARE PHYSICIAN: Rutherford Guys, MD   REQUESTING/REFERRING PHYSICIAN: Dr. Kerman Passey  CHIEF COMPLAINT:   Increasing shortness of breath and weight gain more than 20 pounds in the last few days HISTORY OF PRESENT ILLNESS:  [name]  is a 63 y.o. female with a known history of chronic systolic congestive heart failure, CAD, morbid obesity comes to the emergency room with increasing shortness of breath and weight gain more than 20 pounds in the last few days. Patient takes high doses of torsemide hundred and 60 mg daily. She continues to fill short of breath was found to be hypoxic in the 80s. She is on 2 L nasal cannula currently hundred percent during my evaluation  She was found to have elevated BNP and chest x-ray shows loculated effusion right side. Patient is being admitted for acute on chronic systolic congestive heart failure.  PAST MEDICAL HISTORY:   Past Medical History:  Diagnosis Date  . Acute myocardial infarction, subendocardial infarction, initial episode of care ([name]) 07/21/2012  . Acute osteomyelitis involving ankle and foot ([name]) 02/06/2015  . Acute systolic heart failure ([place]) 07/21/2012   New onset 07/19/12; admission to [facility] ED. Elevated Troponins.  S/p 2D-echo with EF 20-25%.  S/p cardiac catheterization with stenting LAD.  Repeat 2D-echo 10/2011 with improved EF of 35%.   . Anemia   . Automatic implantable cardioverter-defibrillator in situ    a. MDT CRT-D 06/2014, SN: [id number]  .removed in feb 2020  . CAD (coronary artery disease)    a. cardiac cath 101/04/2012: PCI/DES to chronically occluded mLAD, consideration PCI to diag branch in 4 weeks.   . Cataract   . Chicken pox   . Chronic kidney disease   . Chronic systolic CHF (congestive heart failure) (HCC)    a. mixed  ICM & NICM; b. EF 20-25% by echo 07/2012, mid-dist 2/3 of LV sev HK/AK, mild MR. echo 10/2012: EF 30-35%, sev HK ant-septal & inf walls, GR1DD, mild MR, PASP 33. c. echo 02/2013: EF 30%, GR1DD, mild MR. echo 04/2014: EF 30%, Septal-lat dyssynchrony, global HK, inf AK, GR1DD, mild MR. d. echo 10/2014: EF50-55%, WM nl, GR1DD, septal mild paradox. e. echo 02/2015: EF 50-55%, wm   . Depression   . Heart attack ([name])   . Heel ulcer ([name]) 04/27/2015  . History of blood transfusion ~ 2011   "plasma; had neuropathy; couldn't walk"  . Hypertension   . LBBB (left bundle branch block)   . Neuromuscular disorder ([place])   . Neuropathy 2011  . Obesity, unspecified   . OSA on CPAP    Moderate with AHI 23/hr and now on CPAP at 16cm H2O.  Her DME is [facility]  . Pure hypercholesterolemia   . Sepsis ([name]) 09/2018  . Type II diabetes mellitus ([name])   . Unspecified vitamin D deficiency     PAST SURGICAL HISTOIRY:   Past Surgical History:  Procedure Laterality Date  . AMPUTATION TOE Right 06/18/2015   Procedure: AMPUTATION TOE;  Surgeon: Samara Deist, DPM;  Location: [facility] ORS;  Service: Podiatry;  Laterality: Right;  . AMPUTATION TOE Left 10/08/2018   Procedure: AMPUTATION TOE LEFT 2ND;  Surgeon: Albertine Patricia, DPM;  Location: [facility] ORS;  Service: Podiatry;  Laterality: Left;  . BACK SURGERY    . BI-VENTRICULAR IMPLANTABLE CARDIOVERTER  DEFIBRILLATOR N/A 07/06/2014   Procedure: BI-VENTRICULAR IMPLANTABLE CARDIOVERTER DEFIBRILLATOR  (CRT-D);  Surgeon: Deboraha Sprang, MD;  Location: [facility] CATH LAB;  Service: Cardiovascular;  Laterality: N/A;  . BI-VENTRICULAR IMPLANTABLE CARDIOVERTER DEFIBRILLATOR  (CRT-D)  07/06/2014  . BILATERAL OOPHORECTOMY  01/2011   ovarian cyst benign  . COLONOSCOPY WITH PROPOFOL Left 02/22/2015   Procedure: COLONOSCOPY WITH PROPOFOL;  Surgeon: Hulen Luster, MD;  Location: [facility] ENDOSCOPY;  Service: Endoscopy;  Laterality: Left;  . CORONARY ANGIOPLASTY WITH STENT PLACEMENT Left 07/2012   new onset  systolic CHF; elevated troponins.  Cardiac catheterization with stenting to LAD; EF 15%.  2D-echo: EF 20-25%.  . ESOPHAGOGASTRODUODENOSCOPY N/A 02/22/2015   Procedure: ESOPHAGOGASTRODUODENOSCOPY (EGD);  Surgeon: Hulen Luster, MD;  Location: [facility] ENDOSCOPY;  Service: Endoscopy;  Laterality: N/A;  . INCISION AND DRAINAGE ABSCESS Right 2007   groin; with ICU stay due to sepsis.  [name] LAPAROSCOPIC CHOLECYSTECTOMY  2011  . LEFT HEART CATH AND CORONARY ANGIOGRAPHY N/A 10/05/2018   Procedure: LEFT HEART CATH AND CORONARY ANGIOGRAPHY;  Surgeon: Minna Merritts, MD;  Location: [place] CV LAB;  Service: Cardiovascular;  Laterality: N/A;  . LEFT HEART CATHETERIZATION WITH CORONARY ANGIOGRAM N/A 07/21/2012   Procedure: LEFT HEART CATHETERIZATION WITH CORONARY ANGIOGRAM;  Surgeon: Jolaine Artist, MD;  Location: [facility] CATH LAB;  Service: Cardiovascular;  Laterality: N/A;  . [place]   L4-5  . PACEMAKER REMOVAL  11/2018   due to infection around pacemaker  . PERCUTANEOUS CORONARY STENT INTERVENTION (PCI-S) N/A 07/23/2012   Procedure: PERCUTANEOUS CORONARY STENT INTERVENTION (PCI-S);  Surgeon: Sherren Mocha, MD;  Location: [facility] CATH LAB;  Service: Cardiovascular;  Laterality: N/A;  . PERIPHERAL VASCULAR BALLOON ANGIOPLASTY Left 10/06/2018   Procedure: PERIPHERAL VASCULAR BALLOON ANGIOPLASTY;  Surgeon: Katha Cabal, MD;  Location: [place] CV LAB;  Service: Cardiovascular;  Laterality: Left;  . PERIPHERAL VASCULAR CATHETERIZATION N/A 02/10/2015   Procedure: Picc Line Insertion;  Surgeon: Katha Cabal, MD;  Location: [place] CV LAB;  Service: Cardiovascular;  Laterality: N/A;  . TEE WITHOUT CARDIOVERSION N/A 10/02/2018   Procedure: TRANSESOPHAGEAL ECHOCARDIOGRAM (TEE);  Surgeon: Wellington Hampshire, MD;  Location: [facility] ORS;  Service: Cardiovascular;  Laterality: N/A;  . [place]  . VAGINAL HYSTERECTOMY  01/2011   Fibroids/DUB.  Ovaries removed. [place].     SOCIAL HISTORY:   Social History   Tobacco Use  . Smoking status: Never Smoker  . Smokeless tobacco: Never Used  Substance Use Topics  . Alcohol use: No    Alcohol/week: 0.0 standard drinks    FAMILY HISTORY:   Family History  Problem Relation Age of Onset  . Diabetes Mother   . Hypertension Mother   . Arthritis Mother        knees, lumbar DDD, cervical DDD  . Cancer Father        prostate,skin,lymphoma.  . Cancer Brother 61       bladder cancer; non-smoker  . Diabetes Maternal Grandmother   . Heart disease Maternal Grandmother   . COPD Maternal Grandmother   . Diabetes Paternal Grandmother   . Obesity Brother   . Diabetes Son   . Hypertension Son   . Cancer Maternal Grandfather   . Diabetes Paternal Grandfather     DRUG ALLERGIES:  No Known Allergies  REVIEW OF SYSTEMS:  Review of Systems  Constitutional: Negative for chills, fever and weight loss.  HENT: Negative for ear discharge, ear pain and nosebleeds.   Eyes:  Negative for blurred vision, pain and discharge.  Respiratory: Negative for sputum production, shortness of breath, wheezing and stridor.   Cardiovascular: Negative for chest pain, palpitations, orthopnea and PND.  Gastrointestinal: Negative for abdominal pain, diarrhea, nausea and vomiting.  Genitourinary: Negative for frequency and urgency.  Musculoskeletal: Negative for back pain and joint pain.  Neurological: Negative for sensory change, speech change, focal weakness and weakness.  Psychiatric/Behavioral: Negative for depression and hallucinations. The patient is not nervous/anxious.      MEDICATIONS AT HOME:   Prior to Admission medications   Medication Sig Start Date End Date Taking? Authorizing Provider  aspirin EC 81 MG EC tablet Take 1 tablet (81 mg total) by mouth daily. 11/24/18  Yes Epifanio Lesches, MD  Calcium Carbonate-Vitamin D3 (CALCIUM 600-D) 600-400 MG-UNIT TABS Take 1 tablet by mouth 2 (two) times a day.   Yes [provider]  carvedilol (COREG) 6.25 MG tablet Take 1 tablet (6.25 mg total) by mouth 2 (two) times daily with a meal. 12/16/18  Yes Gladstone Lighter, MD  clopidogrel (PLAVIX) 75 MG tablet Take 1 tablet (75 mg total) by mouth daily. 12/24/18  Yes Rutherford Guys, MD  ferrous sulfate 325 (65 FE) MG tablet Take 325 mg by mouth 2 (two) times daily with a meal.   Yes [provider]  gabapentin (NEURONTIN) 600 MG tablet Take 1 tablet (600 mg total) by mouth 2 (two) times daily. Takes 1 in the morning and two at bedtime Patient taking differently: Take 600 mg by mouth 2 (two) times daily. Take 1 tablet (600mg ) by mouth every morning and take 2 tablets (1200mg ) by mouth at bedtime 12/24/18  Yes Rutherford Guys, MD  glimepiride (AMARYL) 4 MG tablet Take 4 mg by mouth 2 (two) times daily.   Yes [provider]  glucose blood (ONE TOUCH ULTRA TEST) test strip USE AS DIRECTED TO CHECK BLOOD SUGAR 3 TIMES DAILY 01/09/19  Yes Rutherford Guys, MD  insulin glargine (LANTUS) 100 UNIT/ML injection Inject 0.16 mLs (16 Units total) into the skin at bedtime. Patient taking differently: Inject 12 Units into the skin at bedtime.  11/24/18  Yes Epifanio Lesches, MD  Multiple Vitamin (MULTIVITAMIN) tablet Take 1 tablet by mouth daily.   Yes [provider]  Omega-3 Fatty Acids (FISH OIL) 1000 MG CAPS Take 1 capsule by mouth 2 (two) times a day.    Yes [provider]  potassium chloride (K-DUR,KLOR-CON) 10 MEQ tablet Take 1 tablet (10 mEq total) by mouth as needed. On days you take the 3rd torsemide dose Patient taking differently: Take 10 mEq by mouth daily.  01/01/19  Yes Hack[place], Otila Kluver A, FNP  rosuvastatin (CRESTOR) 40 MG tablet TAKE 1 TABLET BY MOUTH ONCE DAILY Patient taking differently: Take 40 mg by mouth daily.  02/08/19  Yes Wellington Hampshire, MD  torsemide (DEMADEX) 20 MG tablet Take 4 tablets (80 mg total) by mouth 2 (two) times daily. 02/08/19  Yes Rutherford Guys, MD   traMADol (ULTRAM) 50 MG tablet Take 1 tablet (50 mg total) by mouth every 6 (six) hours as needed. 01/07/19  Yes Rutherford Guys, MD      VITAL SIGNS:  Blood pressure 122/64, pulse 99, temperature 98.3 F (36.8 C), temperature source Oral, resp. rate 17, height 5\' 8"  (1.727 m), weight (!) 145.2 kg, SpO2 96 %.  PHYSICAL EXAMINATION:  GENERAL:  63 y.o.-year-old patient lying in the bed with no acute distress. obese EYES: Pupils equal, round,  reactive to light and accommodation. No scleral icterus. Extraocular muscles intact.  HEENT: Head atraumatic, normocephalic. Oropharynx and nasopharynx clear.  NECK:  Supple, no jugular venous distention. No thyroid enlargement, no tenderness.  LUNGS: decreased breath sounds bilaterally, no wheezing, rales,rhonchi or crepitation. No use of accessory muscles of respiration.  CARDIOVASCULAR: S1, S2 normal. No murmurs, rubs, or gallops.  ABDOMEN: Soft, nontender, nondistended. Bowel sounds present. No organomegaly or mass.  EXTREMITIES: +++ pedal edema, no cyanosis, or clubbing.  NEUROLOGIC: Cranial nerves II through XII are intact. Muscle strength 5/5 in all extremities. Sensation intact. Gait not checked.  PSYCHIATRIC: The patient is alert and oriented x 3.  SKIN: No obvious rash, lesion, or ulcer.   LABORATORY PANEL:   CBC Recent Labs  Lab 02/16/19 1115  WBC 6.4  HGB 11.3*  HCT 36.4  PLT 191   ------------------------------------------------------------------------------------------------------------------  Chemistries  Recent Labs  Lab 02/16/19 1115  NA 138  K 3.8  CL 95*  CO2 29  GLUCOSE 171*  BUN 51*  CREATININE 2.03*  CALCIUM 9.2  AST 18  ALT 7  ALKPHOS 66  BILITOT 0.6   ------------------------------------------------------------------------------------------------------------------  Cardiac Enzymes Recent Labs  Lab 02/16/19 1115  TROPONINI <0.03    ------------------------------------------------------------------------------------------------------------------  RADIOLOGY:  Dg Chest Portable 1 View  Result Date: 02/16/2019 CLINICAL DATA:  Increased fluid retention and shortness of breath EXAM: PORTABLE CHEST 1 VIEW COMPARISON:  01/25/2019 FINDINGS: Cardiac shadow is enlarged but stable. Loculated fluid is again seen in the region of the minor fissure increased when compare with the prior exam. Small right pleural effusion is noted inferiorly as well. Mild vascular congestion is again noted. No focal infiltrate is seen. No bony abnormality is noted. IMPRESSION: Increasing right pleural effusion with increase in more loculated component in the minor fissure. Electronically Signed   By: Inez [name] M.D.   On: 02/16/2019 11:21    EKG:    IMPRESSION AND PLAN:   [name]  is a 63 y.o. female with a known history of chronic systolic congestive heart failure, CAD, morbid obesity comes to the emergency room with increasing shortness of breath and weight gain more than 20 pounds in the last few days.  1. Acute on chronic systolic congestive heart failure -patient came in with increasing shortness of breath, weight gain more than 20 pounds, elevated BNP, paroxysmal nocturnal dyspnea and hypoxia -admit on telemetry floor -IV Lasix 60 mg BID -cardiology consultation-- Dr. end consulted -continue other cardiac meds -monitor daily weight, input output  2. Chronic kidney disease stage III -monitor metabolic panel since patient is on IV diuretics -avoid nephrotoxins  3. Hypertension continue home meds  4. Type II diabetes -continue sliding scale insulin and Lantus. -Adjust insulin according to his sugars in house -hold glyburide  5. DVT prophylaxis subcu heparin   All the records are reviewed and case discussed with ED provider.   CODE STATUS: DNR  TOTAL TIME TAKING CARE OF THIS PATIENT: 50 minutes.    Fritzi Mandes M.D on  02/16/2019 at 2:21 PM  Between 7am to 6pm - Pager - [phone]  After 6pm go to [email or web address] - password [email or web address] [facility]  [facility]  Office  [phone]  CC: Primary care physician; Rutherford Guys, MD

## 2019-02-16 NOTE — ED Notes (Signed)
Patient swabbed for COVID, specimen sent to lab.

## 2019-02-16 NOTE — ED Provider Notes (Signed)
[facility] Emergency Department Provider Note  Time seen: 11:33 AM  I have reviewed the triage vital signs and the nursing notes.   HISTORY  Chief Complaint Shortness of Breath   HPI [name] is a 63 y.o. female with a past medical history of CHF, CAD, hypertension, obesity, presents to the emergency department for difficulty breathing.  According to the patient for the past 3 days she has had progressive worsening shortness of breath.  States she has gained approximately 10 pounds in 2 days.  Has swollen and tight lower extremities.  States significant shortness of breath with any movement or exertion.  Denies any chest pain.  Denies any fever.  Does state a mild cough with occasional yellow sputum production.   Past Medical History:  Diagnosis Date  . Acute myocardial infarction, subendocardial infarction, initial episode of care ([place]) 07/21/2012  . Acute osteomyelitis involving ankle and foot ([place]) 02/06/2015  . Acute systolic heart failure ([place]) 07/21/2012   New onset 07/19/12; admission to [facility] ED. Elevated Troponins.  S/p 2D-echo with EF 20-25%.  S/p cardiac catheterization with stenting LAD.  Repeat 2D-echo 10/2011 with improved EF of 35%.   . Anemia   . Automatic implantable cardioverter-defibrillator in situ    a. MDT CRT-D 06/2014, SN: [id number]  .removed in feb 2020  . CAD (coronary artery disease)    a. cardiac cath 101/04/2012: PCI/DES to chronically occluded mLAD, consideration PCI to diag branch in 4 weeks.   . Cataract   . Chicken pox   . Chronic kidney disease   . Chronic systolic CHF (congestive heart failure) (HCC)    a. mixed ICM & NICM; b. EF 20-25% by echo 07/2012, mid-dist 2/3 of LV sev HK/AK, mild MR. echo 10/2012: EF 30-35%, sev HK ant-septal & inf walls, GR1DD, mild MR, PASP 33. c. echo 02/2013: EF 30%, GR1DD, mild MR. echo 04/2014: EF 30%, Septal-lat dyssynchrony, global HK, inf AK, GR1DD, mild MR. d. echo 10/2014: EF50-55%, WM  nl, GR1DD, septal mild paradox. e. echo 02/2015: EF 50-55%, wm   . Depression   . Heart attack ([place])   . Heel ulcer ([place]) 04/27/2015  . History of blood transfusion ~ 2011   "plasma; had neuropathy; couldn't walk"  . Hypertension   . LBBB (left bundle branch block)   . Neuromuscular disorder ([place])   . Neuropathy 2011  . Obesity, unspecified   . OSA on CPAP    Moderate with AHI 23/hr and now on CPAP at 16cm H2O.  Her DME is [facility]  . Pure hypercholesterolemia   . Sepsis ([place]) 09/2018  . Type II diabetes mellitus ([place])   . Unspecified vitamin D deficiency     Patient Active Problem List   Diagnosis Date Noted  . Weakness generalized 12/22/2018  . Shortness of breath 12/22/2018  . Palliative care encounter 12/22/2018  . Endocarditis 12/14/2018  . NSTEMI (non-ST elevated myocardial infarction) ([place]) 11/22/2018  . Acute exacerbation of CHF (congestive heart failure) ([place]) 10/31/2018  . Acute on chronic systolic CHF (congestive heart failure) ([place]) 10/28/2018  . Lymphedema 10/28/2018  . History of amputation of lesser toe of right foot ([place]) 04/19/2018  . 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 Paraparesis of both lower limbs ([place]) 03/29/2018  . 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 Paroxysmal atrial fibrillation ([place]) 03/29/2018  . Class 3 obesity due to excess calories with serious comorbidity and body mass index (BMI) of 40.0 to 44.9 in adult 09/02/2016  .  History of CVA (cerebrovascular accident) 11/11/2015  . Chronic renal insufficiency 08/04/2015  . OSA (obstructive sleep apnea) 08/15/2014  . Morbid obesity ([place]) 10/26/2013  . Essential hypertension, benign 04/29/2013  . Pure hypercholesterolemia 12/07/2012  . Iron deficiency anemia 12/07/2012  . Diabetic peripheral neuropathy associated with type 2 diabetes mellitus ([place]) 12/07/2012  . Chronic systolic congestive heart failure ([place]) 07/29/2012  . Left bundle-branch block 07/25/2012  .  Atherosclerotic heart disease of native coronary artery without angina pectoris 07/25/2012    Past Surgical History:  Procedure Laterality Date  . AMPUTATION TOE Right 06/18/2015   Procedure: AMPUTATION TOE;  Surgeon: Samara Deist, DPM;  Location: [facility] ORS;  Service: Podiatry;  Laterality: Right;  . AMPUTATION TOE Left 10/08/2018   Procedure: AMPUTATION TOE LEFT 2ND;  Surgeon: Albertine Patricia, DPM;  Location: [facility] ORS;  Service: Podiatry;  Laterality: Left;  . BACK SURGERY    . BI-VENTRICULAR IMPLANTABLE CARDIOVERTER DEFIBRILLATOR N/A 07/06/2014   Procedure: BI-VENTRICULAR IMPLANTABLE CARDIOVERTER DEFIBRILLATOR  (CRT-D);  Surgeon: Deboraha Sprang, MD;  Location[facility] CATH LAB;  Service: Cardiovascular;  Laterality: N/A;  . BI-VENTRICULAR IMPLANTABLE CARDIOVERTER DEFIBRILLATOR  (CRT-D)  07/06/2014  . BILATERAL OOPHORECTOMY  01/2011   ovarian cyst benign  . COLONOSCOPY WITH PROPOFOL Left 02/22/2015   Procedure: COLONOSCOPY WITH PROPOFOL;  Surgeon: Hulen Luster, MD;  Location[facility] ENDOSCOPY;  Service: Endoscopy;  Laterality: Left;  . CORONARY ANGIOPLASTY WITH STENT PLACEMENT Left 07/2012   new onset systolic CHF; elevated troponins.  Cardiac catheterization with stenting to LAD; EF 15%.  2D-echo: EF 20-25%.  . ESOPHAGOGASTRODUODENOSCOPY N/A 02/22/2015   Procedure: ESOPHAGOGASTRODUODENOSCOPY (EGD);  Surgeon: Hulen Luster, MD;  Location: [facility] ENDOSCOPY;  Service: Endoscopy;  Laterality: N/A;  . INCISION AND DRAINAGE ABSCESS Right 2007   groin; with ICU stay due to sepsis.  Marland Kitchen LAPAROSCOPIC CHOLECYSTECTOMY  2011  . LEFT HEART CATH AND CORONARY ANGIOGRAPHY N/A 10/05/2018   Procedure: LEFT HEART CATH AND CORONARY ANGIOGRAPHY;  Surgeon: Minna Merritts, MD;  Location: [place] CV LAB;  Service: Cardiovascular;  Laterality: N/A;  . LEFT HEART CATHETERIZATION WITH CORONARY ANGIOGRAM N/A 07/21/2012   Procedure: LEFT HEART CATHETERIZATION WITH CORONARY ANGIOGRAM;  Surgeon: Jolaine Artist, MD;  Location: [facility]  CATH LAB;  Service: Cardiovascular;  Laterality: N/A;  . [place]   L4-5  . PACEMAKER REMOVAL  11/2018   due to infection around pacemaker  . PERCUTANEOUS CORONARY STENT INTERVENTION (PCI-S) N/A 07/23/2012   Procedure: PERCUTANEOUS CORONARY STENT INTERVENTION (PCI-S);  Surgeon: Sherren Mocha, MD;  Location[facility] C[facility] LAB;  Service: Cardiovascular;  Laterality: N/A;  . PERIPHERAL VASCULAR BALLOON ANGIOPLASTY Left 10/06/2018   Procedure: PERIPHERAL VASCULAR BALLOON ANGIOPLASTY;  Surgeon: Katha Cabal, MD;  Location: [place] CV LAB;  Service: Cardiovascular;  Laterality: Left;  . PERIPHERAL VASCULAR CATHETERIZATION N/A 02/10/2015   Procedure: Picc Line Insertion;  Surgeon: Katha Cabal, MD;  Location: [place] CV LAB;  Service: Cardiovascular;  Laterality: N/A;  . TEE WITHOUT CARDIOVERSION N/A 10/02/2018   Procedure: TRANSESOPHAGEAL ECHOCARDIOGRAM (TEE);  Surgeon: Wellington Hampshire, MD;  Location: [facility] ORS;  Service: Cardiovascular;  Laterality: N/A;  . [place]  . VAGINAL HYSTERECTOMY  01/2011   Fibroids/DUB.  Ovaries removed. [place].    Prior to Admission medications   Medication Sig Start Date End Date Taking? Authorizing Provider  aspirin EC 81 MG EC tablet Take 1 tablet (81 mg total) by mouth daily. 11/24/18   Epifanio Lesches, MD  CALCIUM PO Take 1 tablet by mouth 2 (two) times daily.    [provider]  carvedilol (COREG) 6.25 MG tablet Take 1 tablet (6.25 mg total) by mouth 2 (two) times daily with a meal. 12/16/18   Gladstone Lighter, MD  clopidogrel (PLAVIX) 75 MG tablet Take 1 tablet (75 mg total) by mouth daily. 12/24/18   Rutherford Guys, MD  Ferrous Sulfate (IRON PO) Take 325 mg by mouth 2 (two) times daily.    [provider]  gabapentin (NEURONTIN) 600 MG tablet Take 1 tablet (600 mg total) by mouth 2 (two) times daily. Takes 1 in the morning and two at bedtime 12/24/18   Rutherford Guys, MD  glimepiride  (AMARYL) 4 MG tablet Take 4 mg by mouth 2 (two) times daily.    [provider]  glucose blood (ONE TOUCH ULTRA TEST) test strip USE AS DIRECTED TO CHECK BLOOD SUGAR 3 TIMES DAILY 01/09/19   Rutherford Guys, MD  insulin glargine (LANTUS) 100 UNIT/ML injection Inject 0.16 mLs (16 Units total) into the skin at bedtime. 11/24/18   Epifanio Lesches, MD  losartan (COZAAR) 50 MG tablet Take 0.5 tablets (25 mg total) by mouth daily. 01/01/19   Alisa Graff, FNP  Multiple Vitamin (MULTIVITAMIN) tablet Take 1 tablet by mouth daily.    [provider]  Omega-3 Fatty Acids (FISH OIL PO) Take 1 capsule by mouth 2 (two) times a day.     [provider]  potassium chloride (K-DUR,KLOR-CON) 10 MEQ tablet Take 1 tablet (10 mEq total) by mouth as needed. On days you take the 3rd torsemide dose 01/01/19   Darylene Price A, FNP  rosuvastatin (CRESTOR) 40 MG tablet TAKE 1 TABLET BY MOUTH ONCE DAILY 02/08/19   Wellington Hampshire, MD  torsemide (DEMADEX) 20 MG tablet Take 4 tablets (80 mg total) by mouth 2 (two) times daily. 02/08/19   Rutherford Guys, MD  traMADol (ULTRAM) 50 MG tablet Take 1 tablet (50 mg total) by mouth every 6 (six) hours as needed. 01/07/19   Rutherford Guys, MD    No Known Allergies  Family History  Problem Relation Age of Onset  . Diabetes Mother   . Hypertension Mother   . Arthritis Mother        knees, lumbar DDD, cervical DDD  . Cancer Father        prostate,skin,lymphoma.  . Cancer Brother 61       bladder cancer; non-smoker  . Diabetes Maternal Grandmother   . Heart disease Maternal Grandmother   . COPD Maternal Grandmother   . Diabetes Paternal Grandmother   . Obesity Brother   . Diabetes Son   . Hypertension Son   . Cancer Maternal Grandfather   . Diabetes Paternal Grandfather     Social History Social History   Tobacco Use  . Smoking status: Never Smoker  . Smokeless tobacco: Never Used  Substance Use Topics  . Alcohol use: No     Alcohol/week: 0.0 standard drinks  . Drug use: No    Review of Systems Constitutional: Negative for fever Cardiovascular: Negative for chest pain. Respiratory: Positive for shortness of breath.  Occasional cough. Gastrointestinal: Negative for abdominal pain, vomiting  Musculoskeletal: Increased lower extremity swelling Skin: Negative for skin complaints  Neurological: Negative for headache All other ROS negative  ____________________________________________   PHYSICAL EXAM:  VITAL SIGNS: ED Triage Vitals [02/16/19 1046]  Enc Vitals Group     BP 119/70  Pulse Rate 94     Resp 18     Temp 97.6 F (36.4 C)     Temp Source Oral     SpO2 95 %     Weight (!) 320 lb (145.2 kg)     Height 5\' 8"  (1.727 m)     Head Circumference      Peak Flow      Pain Score 0     Pain Loc      Pain Edu?      Excl. in [place]?    Constitutional: Alert and oriented. Well appearing and in no distress. Eyes: Normal exam ENT      Head: Normocephalic and atraumatic.      Mouth/Throat: Mucous membranes are moist. Cardiovascular: Normal rate, regular rhythm.  Respiratory: Normal respiratory effort without tachypnea nor retractions. Breath sounds are clear.  No obvious wheeze rales or rhonchi. Gastrointestinal: Soft and nontender. No distention.   Musculoskeletal: Nontender with normal range of motion in all extremities.  3+ lower extremity edema, equal bilaterally. Neurologic:  Normal speech and language. No gross focal neurologic deficits  Skin:  Skin is warm, dry and intact.  Psychiatric: Mood and affect are normal.   ____________________________________________    EKG  EKG viewed and interpreted by myself shows a sinus rhythm 89 bpm with a widened QRS, left bundle branch block.  Nonspecific ST changes.  Largely unchanged from prior 01/25/2019.  ____________________________________________    RADIOLOGY  Chest x-ray shows increasing right-sided pleural effusion.  Mild vascular  congestion.  ____________________________________________   INITIAL IMPRESSION / ASSESSMENT AND PLAN / ED COURSE  Pertinent labs & imaging results that were available during my care of the patient were reviewed by me and considered in my medical decision making (see chart for details).   Patient presents to the emergency department for worsening shortness of breath as well as weight gain and increased lower extremity edema.  Differential would include CHF exacerbation, ACS, infectious etiology.  We will check labs, chest x-ray, EKG.  During my evaluation the patient she maintained an O2 saturation between 86 and 92%.  No home O2 requirement.  We will start the patient on supplemental oxygen.  Plan to admit to the hospital service once results are known.  Patient will require IV Lasix for diuresis once potassium has resulted.  Chest x-ray shows vascular congestion with right-sided increased pleural effusion.  Given the patient's hypoxia with minimal exertion into the 80s we will admit the patient to the hospital service for continued IV diuresis.  Patient agreeable to plan of care.  Shakeira Rhee [place] was evaluated in Emergency Department on 02/16/2019 for the symptoms described in the history of present illness. She was evaluated in the context of the global COVID-19 pandemic, which necessitated consideration that the patient might be at risk for infection with the SARS-CoV-2 virus that causes COVID-19. Institutional protocols and algorithms that pertain to the evaluation of patients at risk for COVID-19 are in a state of rapid change based on information released by regulatory bodies including the CDC and federal and state organizations. These policies and algorithms were followed during the patient's care in the ED.  ____________________________________________   FINAL CLINICAL IMPRESSION(S) / ED DIAGNOSES  Dyspnea CHF exacerbation    Harvest Dark, MD 02/16/19 1536

## 2019-02-16 NOTE — Consult Note (Signed)
Cardiology Consultation:   Patient ID: [name] MRN: [id number]; DOB: 1956/02/24  Admit date: 02/16/2019 Date of Consult: 02/16/2019  Primary Care Provider: Rutherford Guys, MD Primary Cardiologist: Ida Rogue, MD  Primary Electrophysiologist:  None   Patient Profile:   [name] is a 63 y.o. 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 Posey Pronto.  History of Present Illness:   [name] is a 63 year old female with PMH as above. NSTEMI 09/2018 2/2 demand ischemia from MSSA bacteremia.  Discharged from [facility] 10/2018 after osteomyelitis of the foot, toe amputation, and acute respiratory distress felt to be 2/2 anemia, pneumonia, systolic and diastolic CHF improved with diuretics. As noted in previous documentation, she has a known LBBB.  [facility] 11/22/2018 admission with elevated troponin, HTN, acute renal failure, and blood culture positive for MSSA bacteremia.  On 11/28/2018, transferred from [facility] to [facility] for removal of ICD. Admitted to [facility] 2/23-3/9. She underwent successful AnioVac 8/10 without complications with recommendation for R lead and ICD removal as CT chest showing multiple septic emboli but MRI thoracic and lumbar not showing discitis or epidural abscess. CXR confirmed removal of ICD 12/11/2018. Patient transferred back to [facility] from [facility] 12/14/2018.  She was seen by cardiology with EP consultation and no plan for LifeVest.  It was felt that she may not be a good candidate for placement of second ICD given complications as above.  Recommendations were to continue medical management and antibiotics for endocarditis.  She  was seen at follow-up 01/07/2019 by Dr. Caryl Comes.  Patient reported progressive dyspnea and SOB at that time, which was felt to be multifactorial with known CAD, left bundle branch block, recent endocarditis, anemia, and obesity/OSA.  She was documented as responding to increased dosage of diuretic.  Plan was for continued medical management with torsemide consolidated from torsemide 20 BID to torsemide 40mg  once daily daily. It was also recommended she continue Fe replacement. Future labs were recommended including BMET, CBC, and echo in 2-3 months.   On 02/16/2019, the patient presented to [facility] ED with complaint of DOE/SOB that started 1 month prior with progressive shortness of breath over the last week and worsening within the last few days. She also reported an increase in daily standing weights of up to approximately 10 pounds in the last 2 days and ~40 pounds since her SOB started 1 month prior.  She reported she had seen her PCP 2 weeks ago (at the start of May) with recommendation to double her torsemide, and since that time, she has been taking torsemide 160 mg daily (4x her regular dose). She denied adding additional salt to her food. She did report at least 3L of fluid intake a day of water with flavor packets, along with 2 cups of coffee. She reported, up until the last 2 days, she was able to lay flat at night with only 1 pillow. However, over the last 2 days, she has needed to sleep in a recliner. She reported that she feels she could walk several feet with DOE but without needing to rest but would be unable to walk further without rest. She denied any CP, palpitations, or feeling of  racing HR. She denied any recent fevers or exposure to those that may potentially expose her to COVID-19. She reportedly decided to present to the ED after her home health provider recommended she seek additional evaluation due to diuretic resistance and current renal function. In the ED, labs significant for K 4.6  3.8, Cr  2.05  2.03 with baseline ~1.2, BUN 25, AST 18, ALT 7, BNP 1289.0, troponin negative, WBC 6.4, Hgb 11.3, RBC 4.08. EKG unable to be accessed per EMR. Vitals showed HR 90-low 100s. CXR showed increasing R pleural effusion with increase in more loculated component in the minor fissure. Cardiology consulted for further evaluation and management.  Past Medical History:  Diagnosis Date   Acute myocardial infarction, subendocardial infarction, initial episode of care ([place]) 07/21/2012   Acute osteomyelitis involving ankle and foot ([place]) 03/12/2946   Acute systolic heart failure ([place]) 07/21/2012   New onset 07/19/12; admission to [facility] ED. Elevated Troponins.  S/p 2D-echo with EF 20-25%.  S/p cardiac catheterization with stenting LAD.  Repeat 2D-echo 10/2011 with improved EF of 35%.    Anemia    Automatic implantable cardioverter-defibrillator in situ    a. MDT CRT-D 06/2014, SN: [id number]  .removed in feb 2020   CAD (coronary artery disease)    a. cardiac cath 101/04/2012: PCI/DES to chronically occluded mLAD, consideration PCI to diag branch in 4 weeks.    Cataract    Chicken pox    Chronic kidney disease    Chronic systolic CHF (congestive heart failure) (HCC)    a. mixed ICM & NICM; b. EF 20-25% by echo 07/2012, mid-dist 2/3 of LV sev HK/AK, mild MR. echo 10/2012: EF 30-35%, sev HK ant-septal & inf walls, GR1DD, mild MR, PASP 33. c. echo 02/2013: EF 30%, GR1DD, mild MR. echo 04/2014: EF 30%, Septal-lat dyssynchrony, global HK, inf AK, GR1DD, mild MR. d. echo 10/2014: EF50-55%, WM nl, GR1DD, septal mild paradox. e. echo 02/2015: EF 50-55%, wm    Depression    Heart attack ([place])    Heel ulcer ([place]) 04/27/2015   History of blood transfusion ~ 2011   "plasma; had neuropathy; couldn't walk"   Hypertension    LBBB (left bundle branch block)    Neuromuscular disorder ([place])    Neuropathy 2011   Obesity, unspecified    OSA on CPAP    Moderate with AHI 23/hr and now on CPAP at 16cm H2O.   Her DME is [facility]   Pure hypercholesterolemia    Sepsis ([place]) 09/2018   Type II diabetes mellitus ([place])    Unspecified vitamin D deficiency     Past Surgical History:  Procedure Laterality Date   AMPUTATION TOE Right 06/18/2015   Procedure: AMPUTATION TOE;  Surgeon: Samara Deist, DPM;  Location: [facility] ORS;  Service: Podiatry;  Laterality: Right;   AMPUTATION TOE Left 10/08/2018   Procedure: AMPUTATION TOE LEFT 2ND;  Surgeon: Albertine Patricia, DPM;  Location: [facility] ORS;  Service: Podiatry;  Laterality: Left;   BACK SURGERY     BI-VENTRICULAR IMPLANTABLE CARDIOVERTER DEFIBRILLATOR N/A 07/06/2014   Procedure: BI-VENTRICULAR IMPLANTABLE CARDIOVERTER DEFIBRILLATOR  (CRT-D);  Surgeon: Deboraha Sprang, MD;  Location: [facility] CATH LAB;  Service: Cardiovascular;  Laterality: N/A;   BI-VENTRICULAR IMPLANTABLE CARDIOVERTER DEFIBRILLATOR  (CRT-D)  07/06/2014   BILATERAL OOPHORECTOMY  01/2011   ovarian cyst benign   COLONOSCOPY WITH PROPOFOL Left 02/22/2015   Procedure: COLONOSCOPY WITH PROPOFOL;  Surgeon: Hulen Luster, MD;  Location: [facility] ENDOSCOPY;  Service: Endoscopy;  Laterality: Left;  CORONARY ANGIOPLASTY WITH STENT PLACEMENT Left 07/2012   new onset systolic CHF; elevated troponins.  Cardiac catheterization with stenting to LAD; EF 15%.  2D-echo: EF 20-25%.   ESOPHAGOGASTRODUODENOSCOPY N/A 02/22/2015   Procedure: ESOPHAGOGASTRODUODENOSCOPY (EGD);  Surgeon: Hulen Luster, MD;  Location: [facility] ENDOSCOPY;  Service: Endoscopy;  Laterality: N/A;   INCISION AND DRAINAGE ABSCESS Right 2007   groin; with ICU stay due to sepsis.   LAPAROSCOPIC CHOLECYSTECTOMY  2011   LEFT HEART CATH AND CORONARY ANGIOGRAPHY N/A 10/05/2018   Procedure: LEFT HEART CATH AND CORONARY ANGIOGRAPHY;  Surgeon: Minna Merritts, MD;  Location: [place] CV LAB;  Service: Cardiovascular;  Laterality: N/A;   LEFT HEART CATHETERIZATION WITH CORONARY ANGIOGRAM N/A 07/21/2012   Procedure: LEFT HEART CATHETERIZATION WITH CORONARY  ANGIOGRAM;  Surgeon: Jolaine Artist, MD;  Location: [facility] CATH LAB;  Service: Cardiovascular;  Laterality: N/A;   LUMBAR [place]   L4-5   PACEMAKER REMOVAL  11/2018   due to infection around pacemaker   [place] (PCI-S) N/A 07/23/2012   Procedure: PERCUTANEOUS CORONARY STENT INTERVENTION (PCI-S);  Surgeon: Sherren Mocha, MD;  Location: [facility] CATH LAB;  Service: Cardiovascular;  Laterality: N/A;   PERIPHERAL VASCULAR BALLOON ANGIOPLASTY Left 10/06/2018   Procedure: PERIPHERAL VASCULAR BALLOON ANGIOPLASTY;  Surgeon: Katha Cabal, MD;  Location: [place] CV LAB;  Service: Cardiovascular;  Laterality: Left;   PERIPHERAL VASCULAR CATHETERIZATION N/A 02/10/2015   Procedure: Picc Line Insertion;  Surgeon: Katha Cabal, MD;  Location: [place] CV LAB;  Service: Cardiovascular;  Laterality: N/A;   TEE WITHOUT CARDIOVERSION N/A 10/02/2018   Procedure: TRANSESOPHAGEAL ECHOCARDIOGRAM (TEE);  Surgeon: Wellington Hampshire, MD;  Location: [facility] ORS;  Service: Cardiovascular;  Laterality: N/A;   [place]  01/2011   Fibroids/DUB.  Ovaries removed. [place].     Home Medications:  Prior to Admission medications   Medication Sig Start Date End Date Taking? Authorizing Provider  albuterol (PROVENTIL) (2.5 MG/3ML) 0.083% nebulizer solution Take 3 mLs (2.5 mg total) by nebulization every 4 (four) hours as needed for wheezing or shortness of breath. 11/24/18   Epifanio Lesches, MD  aspirin EC 81 MG EC tablet Take 1 tablet (81 mg total) by mouth daily. 11/24/18   Epifanio Lesches, MD  clopidogrel (PLAVIX) 75 MG tablet Take 1 tablet (75 mg total) by mouth daily. 11/24/18   Epifanio Lesches, MD  insulin aspart (NOVOLOG) 100 UNIT/ML injection Inject 0-15 Units into the skin 3 (three) times daily with meals. 11/24/18   Epifanio Lesches, MD  insulin aspart (NOVOLOG) 100 UNIT/ML injection Inject 0-5 Units into  the skin at bedtime. 11/24/18   Epifanio Lesches, MD  insulin glargine (LANTUS) 100 UNIT/ML injection Inject 0.16 mLs (16 Units total) into the skin at bedtime. 11/24/18   Epifanio Lesches, MD  ondansetron (ZOFRAN) 4 MG/2ML SOLN injection Inject 2 mLs (4 mg total) into the vein every 6 (six) hours as needed for nausea. 11/24/18   Epifanio Lesches, MD    Inpatient Medications: Scheduled Meds:  [START ON 02/17/2019] aspirin EC  81 mg Oral Daily   calcium-vitamin D  1 tablet Oral BID   carvedilol  6.25 mg Oral BID WC   [START ON 02/17/2019] clopidogrel  75 mg Oral Daily   ferrous sulfate  325 mg Oral BID WC   furosemide  60 mg Intravenous BID   gabapentin  1,200 mg Oral QHS   [START ON 02/17/2019] gabapentin  600  mg Oral q morning - 10a   heparin  5,000 Units Subcutaneous Q8H   insulin aspart  0-5 Units Subcutaneous QHS   insulin aspart  0-9 Units Subcutaneous TID WC   insulin glargine  12 Units Subcutaneous QHS   [START ON 02/17/2019] multivitamin with minerals  1 tablet Oral Daily   [START ON 02/17/2019] omega-3 acid ethyl esters  1 g Oral Daily   rosuvastatin  40 mg Oral Daily   sodium chloride flush  3 mL Intravenous Q12H   Continuous Infusions:  sodium chloride     PRN Meds: sodium chloride, acetaminophen, ondansetron (ZOFRAN) IV, sodium chloride flush, traMADol  Allergies:   No Known Allergies  Social History:   Social History   Socioeconomic History   Marital status: Married    Spouse name: [name]   Number of children: 2   Years of education: 12   Highest education level: High school graduate  Occupational History   Occupation: disabled    Fish farm manager: UNEMPLOYED    Comment: 03/2010 for peripheral neuropathy   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  Social Designer, fashion/clothing strain: Not hard at all   Food insecurity:    Worry: Never true    Inability: Never true   Transportation needs:    Medical: No    Non-medical:  No  Tobacco Use   Smoking status: Never Smoker   Smokeless tobacco: Never Used  Substance and Sexual Activity   Alcohol use: No    Alcohol/week: 0.0 standard drinks   Drug use: No   Sexual activity: Never    Birth control/protection: Post-menopausal, Surgical  Lifestyle   Physical activity:    Days per week: 0 days    Minutes per session: 0 min   Stress: Rather much  Relationships   Social connections:    Talks on phone: More than three times a week    Gets together: More than three times a week    Attends religious service: Never    Active member of club or organization: No    Attends meetings of clubs or organizations: Never    Relationship status: Married   Intimate partner violence:    Fear of current or ex partner: No    Emotionally abused: No    Physically abused: No    Forced sexual activity: No  Other Topics Concern   Not on file  Social History Narrative   Marital status:Married x 45 yrs. Happily married, no abuse.       Children:  2 children (daughter 67, son 37). Two grandsons and 2 step grandchildren.  1 gg.      Lives: with husband, daughter, son-in-law, 2 grandsons, and 1 on the way.      Employment: disability for peripheral neuropathy 2012.  Previously had home daycare.      Tobacco: none      Alcohol: none      Drugs: none      Exercise: none. [employer].      Pets: dog.      Always uses seat belts, smoke detectors in home.      No guns in the home.       Caffeine use: 2 cups coffee per day.       Nutrition: Well balanced diet.    Family History:    Family History  Problem Relation Age of Onset   Diabetes Mother    Hypertension Mother    Arthritis Mother  knees, lumbar DDD, cervical DDD   Cancer Father        prostate,skin,lymphoma.   Cancer Brother 39       bladder cancer; non-smoker   Diabetes Maternal Grandmother    Heart disease Maternal Grandmother    COPD Maternal Grandmother    Diabetes Paternal  Grandmother    Obesity Brother    Diabetes Son    Hypertension Son    Cancer Maternal Grandfather    Diabetes Paternal Grandfather      ROS:  Please see the history of present illness.   Review of Systems  Constitutional: Positive for malaise/fatigue. Negative for chills, diaphoresis and fever.  Respiratory: Negative for shortness of breath and wheezing.   Cardiovascular: Positive for orthopnea and leg swelling. Negative for chest pain and palpitations.  Gastrointestinal: Negative for abdominal pain, nausea and vomiting.  Musculoskeletal: Negative for falls.  Neurological: Negative for dizziness, loss of consciousness and weakness.  All other systems reviewed and are negative.   All other ROS reviewed and negative.     Physical Exam/Data:   Vitals:   02/16/19 1046 02/16/19 1337 02/16/19 1546  BP: 119/70 122/64 123/79  Pulse: 94 99 (!) 103  Resp: 18 17   Temp: 97.6 F (36.4 C) 98.3 F (36.8 C)   TempSrc: Oral Oral   SpO2: 95% 96% 100%  Weight: (!) 145.2 kg    Height: 5\' 8"  (1.727 m)     No intake or output data in the 24 hours ending 02/16/19 1620 Filed Weights   02/16/19 1046  Weight: (!) 145.2 kg   Body mass index is 48.66 kg/m.  General:  Obese female, in no acute distress. Lying in bed with head of bed elevated HEENT: normal. No longer on [place] as documented by IM Neck: JVP ~10-11cm, +JVD though difficult to assess d/t body habitus  Vascular: Radial pulses 2+ bilaterally   Cardiac:  Distant heart sounds, normal S1, S2; RRR; no murmur  Lungs:  Coarse breath sounds, dullness /reduced breath sounds at right base  Abd: soft, nontender, no hepatomegaly  Ext: 2+ bilateral lower extremity edema to the level of the thigh Musculoskeletal:  No deformities Skin: warm and dry.  Neuro:  no focal abnormalities noted Psych:  Normal affect   EKG: Unable to access in EMR Telemetry:  SR, known LBBB, PVCs, rates 80-low 100s  CV Studies:   Relevant CV Studies: Limited  echo 12/11/2018  Limited study to evaluate valves  Moderately decreased left ventricular systolic function, ejection  fraction 40%  Inferolateral and possible anterolateral wall motion abnormality (see  details below)  Elevated pulmonary artery systolic pressure - mild  Tricuspid regurgitation - mild  Normal right ventricular systolic function  No apparent tricuspid valve or device lead vegetation Globally reduced LV function similar to prior study 12/08/18. Wall motion  abnormalities are better evaluated on that study.   Echo 12/08/2018 Moderately decreased left ventricular systolic function, ejection  fraction 40%  Segmental wall motion abnormality - (inferior / posterior)  Mitral annular calcification  Mitral regurgitation - mild  Dilated left atrium - mild  Aortic sclerosis  Normal right ventricular systolic function  Degenerative tricuspid valve disease  Tricuspid regurgitation - mild  Elevated pulmonary artery systolic pressure - moderate  The pacemaker lead is incompletely evaluated  Dilated right atrium - mild  No apparent valvular vegetations  12/04/2018 PROCEDURES PERFORMED Aspiration of mobile vegetation on tricuspid valve and RV lead with  angiovac Central line placement FINDINGS Mobile vegetation on tricuspid  valve and RV lead, successfully aspirated.  LHC 10/05/18  Prox Cx to Dist Cx lesion is 80% stenosed.  Prox LAD-1 lesion is 40% stenosed.  Prox LAD-2 lesion is 90% stenosed.  Prox LAD to Mid LAD lesion is 80% stenosed.  Mid LAD to Dist LAD lesion is 70% stenosed.  1st Diag lesion is 90% stenosed.  Ost 1st Diag lesion is 80% stenosed.  Mid RCA-1 lesion is 40% stenosed.  Mid RCA-2 lesion is 40% stenosed. Final Conclusions:  Severe disease of LAD and diagonal branch Patent proximal LAD stent with severe stenosis at the distal edge of the stent Remainder of the LAD is also small, diffusely diseased, diagonal branch diffusely  diseased -RCA large vessel dominant with mild disease, left circumflex/OM with mild disease proximal region Recommendations:  Case discussed with interventional cardiology, Placing a short stent in the distal edge of the LAD stent may provide little clinical benefit with high risk and may delay surgery on her osteomyelitis/toe infection. Troponin elevation likely secondary to demand ischemia in the setting of febrile, tachycardic, hypertensive on arrival in the setting of bacteremia --Would favor medical management at this time and proceeding with removal of her toe.   TEE 10/02/18 - Left ventricle: Systolic function was mildly reduced. The estimated ejection fraction was in the range of 45% to 50%. - Aortic valve: No evidence of vegetation. There was trivial regurgitation. - Mitral valve: There was mild regurgitation. - Left atrium: No evidence of thrombus in the atrial cavity or appendage. - Right atrium: No evidence of thrombus in the atrial cavity or appendage. - Atrial septum: Echo contrast study showed no right-to-left atrial level shunt, at baseline or with provocation. - Pulmonic valve: No evidence of vegetation.  Impressions: - No evidence of valve vegetation or wire associated vegetation.  TTE 09/29/18 - Left ventricle: The cavity size was normal. Systolic function was mildly reduced. The estimated ejection fraction was in the range of 45% to 50%. The study is not technically sufficient to allow evaluation of LV diastolic function. - Left atrium: The atrium was mildly dilated.  Impressions: - Very suboptimal study. No evidence of vegetations. Consider TEE.  Laboratory Data:  Chemistry Recent Labs  Lab 02/11/19 0947 02/16/19 1115  NA 138 138  K 4.6 3.8  CL 95* 95*  CO2 27 29  GLUCOSE 99 171*  BUN 51* 51*  CREATININE 2.05* 2.03*  CALCIUM 9.3 9.2  GFRNONAA 25* 25*  GFRAA 29* 29*  ANIONGAP  --  14    Recent Labs  Lab 02/11/19 0947  02/16/19 1115  PROT 6.1 7.7  ALBUMIN 3.9 4.0  AST 15 18  ALT 8 7  ALKPHOS 64 66  BILITOT 0.4 0.6   Hematology Recent Labs  Lab 02/16/19 1115  WBC 6.4  RBC 4.08  HGB 11.3*  HCT 36.4  MCV 89.2  MCH 27.7  MCHC 31.0  RDW 14.4  PLT 191   Cardiac Enzymes Recent Labs  Lab 02/16/19 1115  TROPONINI <0.03   No results for input(s): TROPIPOC in the last 168 hours.  BNP Recent Labs  Lab 02/16/19 1115  BNP 1,289.0*    DDimer No results for input(s): DDIMER in the last 168 hours.  Radiology/Studies:  Dg Chest Portable 1 View  Result Date: 02/16/2019 CLINICAL DATA:  Increased fluid retention and shortness of breath EXAM: PORTABLE CHEST 1 VIEW COMPARISON:  01/25/2019 FINDINGS: Cardiac shadow is enlarged but stable. Loculated fluid is again seen in the region of the minor fissure increased  when compare with the prior exam. Small right pleural effusion is noted inferiorly as well. Mild vascular congestion is again noted. No focal infiltrate is seen. No bony abnormality is noted. IMPRESSION: Increasing right pleural effusion with increase in more loculated component in the minor fissure. Electronically Signed   By: Inez [name] M.D.   On: 02/16/2019 11:21    Assessment and Plan:   Acute on Chronic combined systolic and diastolic heart failure -Significantly volume overload on exam. Per PCP, PTA torsemide increased from torsemide 40mg  daily to torsemide 160mg  daily without patient reported improved SOB /LEE.  --BNP 1289.0. Chest xray with R pleural effusion. Consider volume overload 2/2 oral diuretic resistance. Patient also reporting significant fluid intake, which likely contributes to volume overload.  - Most recent echo as above. EF 40%, Mildly elevated PASP.   --Weight at last discharge 279lbs, current weight 318lbs. Continue to record daily standing weights.  --Document strict I/O. Low sodium diet.  --Continue IV lasix 60mg  BID for aggressive diuresis and titrate as renal  function and electrolytes allow for optimal volume status and until euvolemic on exam. Patient will likely need several days of diuresis. Will transition back to oral diuretic at discharge.  --Daily BMET. Baseline Cr 1.14 and current Cr 2.03, BUN 51. Renal function decreased at admission and in the setting of volume overload. Closely monitor renal function and electrolytes with diuresis.  --Continue holding losartan/ARB and other nephrotoxins until improvement in renal function and as BP currently controlled. Continue Carvedilol and up-titrate for optimal BP and HR control.  Monitor HR -with continued elevated rates, uptitrate Carvedilol as BP allows.   CAD -No current chest pain. Known severe disease.  Previous cardiac catheterization 10/05/2018 with chronic occlusion of LAD and intervention/PCI not recommended at that time.  Troponin negative. 4/29 LDL 25 and at goal of below 70.  - No plan for ischemic workup this admission - Continue medical management with asa 81mg , clopidogrel 75mg  qd. Monitor anemia as on DAPT with daily CBC. Continue rosuvastatin 40mg  daily. As above, continue holding ARB until improved renal function. Continue Carvedilol.   HTN, controlled - Currently controlled with SBP in 120s. Continue Coreg and titrate as needed for optimal HR and BP control. Holding ARB/losartan until improved renal function as above.   Acute on CKDIII - Reduced renal function in setting of significant volume overload at presentation.  --Holding ARB as above. Continue to hold until improved renal function  --Daily BMET and close monitoring of renal function and electrolytes with IV diuresis ---Avoid contrast procedures, nephrotoxins.  - Baseline Cr 1.14 with current Cr 2.05  2.03 and BUN 51.   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  I - Not pacemaker dependent. ICD implantation for primary prevention of sudden death in 01/12/2014 and in setting of HFrEF. Telemetry as above with patient maintaining SR and rates in the 80s.  - No recommendation for Lifevest at this time. As above, previously felt not an ideal candidate for second ICD implantation. - Recommend continued outpatient follow-up with Dr. Caryl Comes   Anemia, mild - Hgb 11.3. Improved from prior admission. Continue PTA iron supplementation. Given h/o anemia, continue to monitor as DAPT.  Hypokalemia - K 3.8. Replete with goal 4.0. Check Mg. AM BMET.  DM2 -SSI, SSI. Per  IM. A1C checked 4/29 and 5.6  For questions or updates, please contact [place] Please consult [email or web address] for contact info under     Signed, Arvil Chaco, PA-C  02/16/2019 4:20 PM

## 2019-02-16 NOTE — ED Notes (Signed)
IV meds given as ordered. Patient awaiting admission status and orders. Vss, will monitor.

## 2019-02-16 NOTE — ED Notes (Signed)
Report to receiving nurse.

## 2019-02-16 NOTE — ED Notes (Signed)
Pt to floor room 255 by this tech. [place]

## 2019-02-16 NOTE — ED Notes (Signed)
Unable to give reports to receiving nurse.

## 2019-02-16 NOTE — ED Notes (Signed)
IV hep lock placed to right FA (22G), chest xray completed. Labs drawn and sent. MD at  bedside.

## 2019-02-16 NOTE — ED Notes (Signed)
EKG completed

## 2019-02-16 NOTE — ED Notes (Signed)
Patient reports gaining 10 lbs in 2 days taking 160 mfg of lasix daily with no relief of fluid. Feeling edema to bilateral lower extremities up into her abd. Also states starting to feel sob.

## 2019-02-16 NOTE — ED Notes (Signed)
Patient desating down into the soft 80's. 2lnc applied. patient up 98%

## 2019-02-16 NOTE — ED Notes (Signed)
Report to Steve RN

## 2019-02-16 NOTE — ED Triage Notes (Signed)
Pt c/o increased fluid retention with SOB over the past week, states she has gained 10lbs in the past 2 days. Pt is in NAD on arrival. States she is currently on torsemide 160mg  daily

## 2019-02-16 NOTE — Progress Notes (Signed)
Family Meeting Note  Advance Directive: yes  Patient has multiple medical problems including congestive heart failure systolic EF of from 83%, AICD placement, hypertension, diabetes, CAD comes in with increasing shortness of breath being admitted with acute on chronic systolic congestive heart failure. Patient has had discussion with palliative care in the past. She tells me she wants to be DNR.  Spent 16 minutes   Fritzi Mandes, MD

## 2019-02-17 ENCOUNTER — Inpatient Hospital Stay: Payer: PPO

## 2019-02-17 DIAGNOSIS — I5043 Acute on chronic combined systolic (congestive) and diastolic (congestive) heart failure: Secondary | ICD-10-CM

## 2019-02-17 DIAGNOSIS — N183 Chronic kidney disease, stage 3 (moderate): Secondary | ICD-10-CM

## 2019-02-17 LAB — BASIC METABOLIC PANEL
Anion gap: 12 (ref 5–15)
BUN: 50 mg/dL — ABNORMAL HIGH (ref 8–23)
CO2: 30 mmol/L (ref 22–32)
Calcium: 8.9 mg/dL (ref 8.9–10.3)
Chloride: 97 mmol/L — ABNORMAL LOW (ref 98–111)
Creatinine, Ser: 1.88 mg/dL — ABNORMAL HIGH (ref 0.44–1.00)
GFR calc Af Amer: 32 mL/min — ABNORMAL LOW (ref >60)
GFR calc non Af Amer: 28 mL/min — ABNORMAL LOW (ref >60)
Glucose, Bld: 148 mg/dL — ABNORMAL HIGH (ref 70–99)
Potassium: 3.6 mmol/L (ref 3.5–5.1)
Sodium: 139 mmol/L (ref 135–145)

## 2019-02-17 LAB — GLUCOSE, CAPILLARY
Glucose-Capillary: 107 mg/dL — ABNORMAL HIGH (ref 70–99)
Glucose-Capillary: 123 mg/dL — ABNORMAL HIGH (ref 70–99)
Glucose-Capillary: 119 mg/dL — ABNORMAL HIGH (ref 70–99)
Glucose-Capillary: 162 mg/dL — ABNORMAL HIGH (ref 70–99)

## 2019-02-17 MED ORDER — METOLAZONE 5 MG PO TABS
5.0000 mg | ORAL_TABLET | Freq: Every day | ORAL | Status: DC
  Administered 2019-02-17 – 2019-02-22 (×6): 5 mg via ORAL
  Filled 2019-02-17 (×6): qty 1

## 2019-02-17 MED ORDER — FUROSEMIDE 10 MG/ML IJ SOLN
80.0000 mg | Freq: Two times a day (BID) | INTRAMUSCULAR | Status: DC
  Administered 2019-02-17 – 2019-02-20 (×7): 80 mg via INTRAVENOUS
  Filled 2019-02-17 (×7): qty 8

## 2019-02-17 NOTE — Progress Notes (Deleted)
Progress Note  Patient Name: [name] Date of Encounter: 02/17/2019  Primary Cardiologist: Ida Rogue, MD   Subjective   Patient continues to report shortness of breath today.    Her main complaint is of productive cough with green phlegm.    No cardiac complaint of chest pain, palpitations, or racing heart rate with telemetry suspicious for underlying atrial fibrillation and patient reporting diagnosis in the past.   Inpatient Medications    Scheduled Meds: . aspirin EC  81 mg Oral Daily  . calcium-vitamin D  1 tablet Oral BID  . carvedilol  6.25 mg Oral BID WC  . clopidogrel  75 mg Oral Daily  . ferrous sulfate  325 mg Oral BID WC  . furosemide  80 mg Intravenous BID  . gabapentin  1,200 mg Oral QHS  . gabapentin  600 mg Oral q morning - 10a  . heparin  5,000 Units Subcutaneous Q8H  . insulin aspart  0-5 Units Subcutaneous QHS  . insulin aspart  0-9 Units Subcutaneous TID WC  . insulin glargine  12 Units Subcutaneous QHS  . multivitamin with minerals  1 tablet Oral Daily  . omega-3 acid ethyl esters  1 g Oral Daily  . rosuvastatin  40 mg Oral Daily  . sodium chloride flush  3 mL Intravenous Q12H   Continuous Infusions: . sodium chloride     PRN Meds: sodium chloride, acetaminophen, ondansetron (ZOFRAN) IV, sodium chloride flush, traMADol   Vital Signs    Vitals:   02/17/19 0005 02/17/19 0412 02/17/19 0804 02/17/19 1706  BP:  113/76 98/68 103/63  Pulse: 94 88 (!) 105 88  Resp: 18 16 20 19   Temp:  98.2 F (36.8 C) 97.6 F (36.4 C) 98.2 F (36.8 C)  TempSrc:  Oral Oral Oral  SpO2: 97% 99% 99% 98%  Weight: (!) 146 kg     Height:        Intake/Output Summary (Last 24 hours) at 02/17/2019 1746 Last data filed at 02/17/2019 1130 Gross per 24 hour  Intake -  Output 1500 ml  Net -1500 ml   Filed Weights   02/16/19 1046 02/16/19 1546 02/17/19 0005  Weight: (!) 145.2 kg (!) 144.6 kg (!) 146 kg    Telemetry    Sinus tachycardia versus atrial  tachycardia, left bundle branch block- Personally Reviewed  ECG    Sinus tachycardia versus atrial tachycardia, known left bundle branch block- Personally Reviewed  Physical Exam   GEN:  Obese female.  No acute distress.  Lying in bed Neck:  JVD difficult to assess due to body habitus Cardiac:  Distant heart sounds, tachycardic but regular, no murmurs, rubs, or gallops.  Respiratory:  Increased respiratory effort, bilateral wheezing and rhonchi, coarse breath sounds bilaterally GI:  Obese.  Soft, nontender, non-distended  MS:  1+ pitting bilateral edema; No deformity. Neuro:  Nonfocal  Psych: Normal affect   Labs    Chemistry Recent Labs  Lab 02/11/19 0947 02/16/19 1115 02/17/19 0351  NA 138 138 139  K 4.6 3.8 3.6  CL 95* 95* 97*  CO2 27 29 30   GLUCOSE 99 171* 148*  BUN 51* 51* 50*  CREATININE 2.05* 2.03* 1.88*  CALCIUM 9.3 9.2 8.9  PROT 6.1 7.7  --   ALBUMIN 3.9 4.0  --   AST 15 18  --   ALT 8 7  --   ALKPHOS 64 66  --   BILITOT 0.4 0.6  --   GFRNONAA 25* 25*  28*  GFRAA 29* 29* 32*  ANIONGAP  --  14 12     Hematology Recent Labs  Lab 02/16/19 1115  WBC 6.4  RBC 4.08  HGB 11.3*  HCT 36.4  MCV 89.2  MCH 27.7  MCHC 31.0  RDW 14.4  PLT 191    Cardiac Enzymes Recent Labs  Lab 02/16/19 1115  TROPONINI <0.03   No results for input(s): TROPIPOC in the last 168 hours.   BNP Recent Labs  Lab 02/16/19 1115  BNP 1,289.0*     DDimer No results for input(s): DDIMER in the last 168 hours.   Radiology    Dg Chest Portable 1 View  Result Date: 02/16/2019 CLINICAL DATA:  Increased fluid retention and shortness of breath EXAM: PORTABLE CHEST 1 VIEW COMPARISON:  01/25/2019 FINDINGS: Cardiac shadow is enlarged but stable. Loculated fluid is again seen in the region of the minor fissure increased when compare with the prior exam. Small right pleural effusion is noted inferiorly as well. Mild vascular congestion is again noted. No focal infiltrate is seen. No  bony abnormality is noted. IMPRESSION: Increasing right pleural effusion with increase in more loculated component in the minor fissure. Electronically Signed   By: Inez [name] M.D.   On: 02/16/2019 11:21    Cardiac Studies   Limited echo 12/11/2018  Limited study to evaluate valves  Moderately decreased left ventricular systolic function, ejection  fraction 40%  Inferolateral and possible anterolateral wall motion abnormality (see  details below)  Elevated pulmonary artery systolic pressure - mild  Tricuspid regurgitation - mild  Normal right ventricular systolic function  No apparent tricuspid valve or device lead vegetation Globally reduced LV function similar to prior study 12/08/18. Wall motion  abnormalities are better evaluated on that study.   Echo 12/08/2018 Moderately decreased left ventricular systolic function, ejection  fraction 40%  Segmental wall motion abnormality - (inferior / posterior)  Mitral annular calcification  Mitral regurgitation - mild  Dilated left atrium - mild  Aortic sclerosis  Normal right ventricular systolic function  Degenerative tricuspid valve disease  Tricuspid regurgitation - mild  Elevated pulmonary artery systolic pressure - moderate  The pacemaker lead is incompletely evaluated  Dilated right atrium - mild  No apparent valvular vegetations  12/04/2018 PROCEDURES PERFORMED Aspiration of mobile vegetation on tricuspid valve and RV lead with  angiovac Central line placement FINDINGS Mobile vegetation on tricuspid valve and RV lead, successfully aspirated.  LHC 10/05/18  Prox Cx to Dist Cx lesion is 80% stenosed.  Prox LAD-1 lesion is 40% stenosed.  Prox LAD-2 lesion is 90% stenosed.  Prox LAD to Mid LAD lesion is 80% stenosed.  Mid LAD to Dist LAD lesion is 70% stenosed.  1st Diag lesion is 90% stenosed.  Ost 1st Diag lesion is 80% stenosed.  Mid RCA-1 lesion is 40% stenosed.  Mid RCA-2 lesion is 40%  stenosed. Final Conclusions:  Severe disease of LAD and diagonal branch Patent proximal LAD stent with severe stenosis at the distal edge of the stent Remainder of the LAD is also small, diffusely diseased, diagonal branch diffusely diseased -RCA large vessel dominant with mild disease, left circumflex/OM with mild disease proximal region Recommendations:  Case discussed with interventional cardiology, Placing a short stent in the distal edge of the LAD stent may provide little clinical benefit with high risk and may delay surgery on her osteomyelitis/toe infection. Troponin elevation likely secondary to demand ischemia in the setting of febrile, tachycardic, hypertensive on arrival in the setting  of bacteremia --Would favor medical management at this time and proceeding with removal of her toe.   TEE 10/02/18 - Left ventricle: Systolic function was mildly reduced. The estimated ejection fraction was in the range of 45% to 50%. - Aortic valve: No evidence of vegetation. There was trivial regurgitation. - Mitral valve: There was mild regurgitation. - Left atrium: No evidence of thrombus in the atrial cavity or appendage. - Right atrium: No evidence of thrombus in the atrial cavity or appendage. - Atrial septum: Echo contrast study showed no right-to-left atrial level shunt, at baseline or with provocation. - Pulmonic valve: No evidence of vegetation.  Impressions: - No evidence of valve vegetation or wire associated vegetation.  TTE 09/29/18 - Left ventricle: The cavity size was normal. Systolic function was mildly reduced. The estimated ejection fraction was in the range of 45% to 50%. The study is not technically sufficient to allow evaluation of LV diastolic function. - Left atrium: The atrium was mildly dilated.  Impressions: - Very suboptimal study. No evidence of vegetations. Consider TEE.  Patient Profile     63 y.o. female hx of CAD, known LBBB,  mixed ischemic/nonischemic systolic and diastolic dysfunction status post CRT-D 2015 with improvement in EF to 40-45%, type 2 diabetes, hypertension, obesity, OSA prescribed CPAP, NSTEMI 09/2018 and 2/2 MSSA bacteremia with bacteremia and s/p ICD removal at [facility] and being seen today for acute on chronic combined systolic and diastolic heart failure at the request of Dr. Posey Pronto.  Assessment & Plan    Acute on Chronic combined systolic and diastolic heart failure -Remains volume overload on exam.  - Most recent echo as above. EF 40%, Mildly elevated PASP.   --Continue to document daily standing weights.  Weight at last discharge 279lbs.  Since admission, 320lbs  318lbs  321lbs. Inconsistent weights have been recorded so far and may be 2/2 obtaining standing versus bed weights.  --Continue to document strict I/O.  Net -600cc but may be 2/2 gaps in I/O documentation. --Continue IV lasix 80mg  BID for aggressive diuresis and titrate as renal function and electrolytes allow for optimal volume status and until euvolemic on exam. Patient will likely need several days of diuresis. Will transition back to oral diuretic at discharge.  --Daily BMET. Baseline Cr 1.14. Admission Cr improving 2.03  1.88 , BUN 51  50. Closely monitor renal function and electrolytes with diuresis.  --Continue holding losartan/ARB and other nephrotoxins until renal function stable. Continue Carvedilol and up-titrate for optimal BP and HR control.    Atrial Tachycardia versus Sinus Tachycardia -Suspect underlying atrial tachycardia with repeat EKG ordered and EMR indicating previous PAF diagnosis.  Not on PTA anticoagulation. CHA2DS2VASc score of at least 5 (HTN, CAD, female, age, DM2) with recommendation for long-term anticoagulation.  Will need to consider anemia and DAPT.  CAD -No current chest pain. Known severe disease.  Previous cardiac catheterization 10/05/2018 with chronic occlusion of LAD and intervention/PCI not recommended  at that time.  Troponin negative. 4/29 LDL 25 and at goal of below 70.  - No plan for ischemic workup this admission - Continue medical management with asa 81mg , clopidogrel 75mg  qd. Monitor anemia as on DAPT with daily CBC. Continue rosuvastatin 40mg  daily. As above, continue holding ARB until improved renal function. Continue Carvedilol.   HTN, controlled - Currently controlled with soft BP and SBP low 100s. Continue Coreg and titrate as needed for optimal HR and BP control. Holding ARB/losartan until improved renal function as above.   Acute on  CKDIII - Reduced renal function in setting of significant volume overload at presentation.  --Renal function improving with diuresis.  Continue to hold ARB for now. --Daily BMET and close monitoring of renal function and electrolytes with IV diuresis ---Avoid contrast procedures, nephrotoxins.  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  I - Not pacemaker dependent. ICD implantation for primary prevention of sudden death in 06-Jan-2014 and in setting of HFrEF. Telemetry as above with patient maintaining SR and rates in the 80s.  - No recommendation for Lifevest at this time. As above, previously felt not an ideal candidate for second ICD implantation. - Recommend continued outpatient follow-up with Dr. Caryl Comes   Anemia, mild - Monitor as above.  PTA iron supplementation.  Hypokalemia - K 3.8  3.6. Replete with goal 4.0. Check Mg. AM BMET.  DM2 -SSI, SSI. Per IM. A1C checked 4/29 and 5.6  For questions or updates, please contact [place] Please consult [email or web address] for contact info under     For questions or updates, please contact Section Please consult  [email or web address] for contact info under        Signed, Arvil Chaco, PA-C  02/17/2019, 5:46 PM

## 2019-02-17 NOTE — Progress Notes (Signed)
Lasix help this p.m. per NP. Pt given 60 mg IV lasix in the ED at 1330 and was only putting out moderate amounts of urine. BUN 51 and CRE 2.03, BP 107/67. NP made aware, per NP lasix held until the am dose. Pt in no noted distress and lung sounds diminished but clear. Will continue to monitor.

## 2019-02-17 NOTE — Progress Notes (Signed)
[place] [facility] [place] NAME: [name]    MR#:  [id number]  DATE OF BIRTH:  1955-10-17  SUBJECTIVE:  slept well overnight. Good urine output. Feels a lot better. Seen by cardiology. Eating well  REVIEW OF SYSTEMS:   Review of Systems  Constitutional: Negative for chills, fever and weight loss.  HENT: Negative for ear discharge, ear pain and nosebleeds.   Eyes: Negative for blurred vision, pain and discharge.  Respiratory: Positive for shortness of breath. Negative for sputum production, wheezing and stridor.   Cardiovascular: Positive for leg swelling. Negative for chest pain, palpitations, orthopnea and PND.  Gastrointestinal: Negative for abdominal pain, diarrhea, nausea and vomiting.  Genitourinary: Negative for frequency and urgency.  Musculoskeletal: Negative for back pain and joint pain.  Neurological: Negative for sensory change, speech change, focal weakness and weakness.  Psychiatric/Behavioral: Negative for depression and hallucinations. The patient is not nervous/anxious.    Tolerating Diet:yes Tolerating PT:   DRUG ALLERGIES:  No Known Allergies  VITALS:  Blood pressure 98/68, pulse (!) 105, temperature 97.6 F (36.4 C), temperature source Oral, resp. rate 20, height 5\' 8"  (1.727 m), weight (!) 146 kg, SpO2 99 %.  PHYSICAL EXAMINATION:   Physical Exam  GENERAL:  63 y.o.-year-old patient lying in the bed with no acute distress. Morbidly obese EYES: Pupils equal, round, reactive to light and accommodation. No scleral icterus. Extraocular muscles intact.  HEENT: Head atraumatic, normocephalic. Oropharynx and nasopharynx clear.  NECK:  Supple, no jugular venous distention. No thyroid enlargement, no tenderness.  LUNGS: distant breath sounds bilaterally, no wheezing, rales, rhonchi. No use of accessory muscles of respiration.  CARDIOVASCULAR: S1, S2 normal. No murmurs, rubs, or gallops.  ABDOMEN: Soft, nontender,  nondistended. Bowel sounds present. No organomegaly or mass.  EXTREMITIES:++ edema b/l.    NEUROLOGIC: Cranial nerves II through XII are intact. No focal Motor or sensory deficits b/l.   PSYCHIATRIC:  patient is alert and oriented x 3.  SKIN: No obvious rash, lesion, or ulcer.   LABORATORY PANEL:  CBC Recent Labs  Lab 02/16/19 1115  WBC 6.4  HGB 11.3*  HCT 36.4  PLT 191    Chemistries  Recent Labs  Lab 02/16/19 1115 02/17/19 0351  NA 138 139  K 3.8 3.6  CL 95* 97*  CO2 29 30  GLUCOSE 171* 148*  BUN 51* 50*  CREATININE 2.03* 1.88*  CALCIUM 9.2 8.9  AST 18  --   ALT 7  --   ALKPHOS 66  --   BILITOT 0.6  --    Cardiac Enzymes Recent Labs  Lab 02/16/19 1115  TROPONINI <0.03   RADIOLOGY:  Dg Chest Portable 1 View  Result Date: 02/16/2019 CLINICAL DATA:  Increased fluid retention and shortness of breath EXAM: PORTABLE CHEST 1 VIEW COMPARISON:  01/25/2019 FINDINGS: Cardiac shadow is enlarged but stable. Loculated fluid is again seen in the region of the minor fissure increased when compare with the prior exam. Small right pleural effusion is noted inferiorly as well. Mild vascular congestion is again noted. No focal infiltrate is seen. No bony abnormality is noted. IMPRESSION: Increasing right pleural effusion with increase in more loculated component in the minor fissure. Electronically Signed   By: Inez [name] M.D.   On: 02/16/2019 11:21   ASSESSMENT AND PLAN:  [name]  is a 63 y.o. female with a known history of chronic systolic congestive heart failure, CAD, morbid obesity comes to the emergency room with  increasing shortness of breath and weight gain more than 20 pounds in the last few days.  1. Acute on chronic systolic congestive heart failure -patient came in with increasing shortness of breath, weight gain more than 40 pounds, elevated BNP, paroxysmal nocturnal dyspnea and hypoxia -admit on telemetry floor -IV Lasix 60 mg BID -cardiology consultation--  Dr. end consult appreciated -continue other cardiac meds -monitor daily weight, input output  2. Chronic kidney disease stage III -monitor metabolic panel since patient is on IV diuretics -avoid nephrotoxins -creat better today after IV lasix  3. Hypertension continue home meds  4. Type II diabetes -continue sliding scale insulin and Lantus. -Adjust insulin according to his sugars in house -hold glyburide  5. DVT prophylaxis subcu heparin  Case discussed with Care Management/Social Worker. Management plans discussed with the patient, family and they are in agreement.  CODE STATUS: DNR DVT Prophylaxis: heparin  TOTAL TIME TAKING CARE OF THIS PATIENT: *30* minutes.  >50% time spent on counselling and coordination of care  POSSIBLE D/C IN *1-2* DAYS, DEPENDING ON CLINICAL CONDITION.  Note: This dictation was prepared with Dragon dictation along with smaller phrase technology. Any transcriptional errors that result from this process are unintentional.  Fritzi Mandes M.D on 02/17/2019 at 12:12 PM  Between 7am to 6pm - Pager - [phone]  After 6pm go to [email or web address] - password [email or web address] [facility]  Office  [phone]  CC: Primary care physician; Rutherford Guys, MDPatient ID: [name], female   DOB: Dec 17, 1955, 63 y.o.   MRN: [id number]

## 2019-02-17 NOTE — Progress Notes (Signed)
CPAP set at 16cm H20 per MD notes for patient's home setting. Patient instructed to call for Respiratory if CPAP settings needs to be adjusted or become intolerable. Patient advised to call RT for further assistance if needed. Patient resting comfortably in bed on 3L Nasal Mask CPAP.

## 2019-02-17 NOTE — Progress Notes (Signed)
Progress Note  Patient Name: [name] Date of Encounter: 02/17/2019  Primary Cardiologist: Ida Rogue, MD  Subjective   Patient reports breathing has improved slightly.  Still feels swollen.  No chest pain or palpitations.  Inpatient Medications    Scheduled Meds: . aspirin EC  81 mg Oral Daily  . calcium-vitamin D  1 tablet Oral BID  . carvedilol  6.25 mg Oral BID WC  . clopidogrel  75 mg Oral Daily  . ferrous sulfate  325 mg Oral BID WC  . furosemide  80 mg Intravenous BID  . gabapentin  1,200 mg Oral QHS  . gabapentin  600 mg Oral q morning - 10a  . heparin  5,000 Units Subcutaneous Q8H  . insulin aspart  0-5 Units Subcutaneous QHS  . insulin aspart  0-9 Units Subcutaneous TID WC  . insulin glargine  12 Units Subcutaneous QHS  . multivitamin with minerals  1 tablet Oral Daily  . omega-3 acid ethyl esters  1 g Oral Daily  . rosuvastatin  40 mg Oral Daily  . sodium chloride flush  3 mL Intravenous Q12H   Continuous Infusions: . sodium chloride     PRN Meds: sodium chloride, acetaminophen, ondansetron (ZOFRAN) IV, sodium chloride flush, traMADol   Vital Signs    Vitals:   02/17/19 0005 02/17/19 0412 02/17/19 0804 02/17/19 1706  BP:  113/76 98/68 103/63  Pulse: 94 88 (!) 105 88  Resp: 18 16 20 19   Temp:  98.2 F (36.8 C) 97.6 F (36.4 C) 98.2 F (36.8 C)  TempSrc:  Oral Oral Oral  SpO2: 97% 99% 99% 98%  Weight: (!) 146 kg     Height:        Intake/Output Summary (Last 24 hours) at 02/17/2019 1754 Last data filed at 02/17/2019 1130 Gross per 24 hour  Intake -  Output 1500 ml  Net -1500 ml   Last 3 Weights 02/17/2019 02/16/2019 02/16/2019  Weight (lbs) 321 lb 14 oz 318 lb 11.2 oz 320 lb  Weight (kg) 146 kg 144.561 kg 145.151 kg  Some encounter information is confidential and restricted. Go to Review Flowsheets activity to see all data.      Telemetry    Suspect atrial fibrillation with rapid ventricular response..- Personally Reviewed   ECG    Likely sinus tachycardia with PVC and left bundle branch block.  Cannot exclude atypical flutter or atrial tachycardia - Personally Reviewed  Physical Exam   GEN: No acute distress.   Neck:  Unable to assess JVP due to body habitus. Cardiac:  Distant heart sounds, which seem irregularly irregular. Respiratory: Clear to auscultation bilaterally. GI: Soft, nontender, non-distended  MS:  2+ pretibial edema bilaterally; No deformity. Neuro:  Nonfocal  Psych: Normal affect   Labs    Chemistry Recent Labs  Lab 02/11/19 0947 02/16/19 1115 02/17/19 0351  NA 138 138 139  K 4.6 3.8 3.6  CL 95* 95* 97*  CO2 27 29 30   GLUCOSE 99 171* 148*  BUN 51* 51* 50*  CREATININE 2.05* 2.03* 1.88*  CALCIUM 9.3 9.2 8.9  PROT 6.1 7.7  --   ALBUMIN 3.9 4.0  --   AST 15 18  --   ALT 8 7  --   ALKPHOS 64 66  --   BILITOT 0.4 0.6  --   GFRNONAA 25* 25* 28*  GFRAA 29* 29* 32*  ANIONGAP  --  14 12     Hematology Recent Labs  Lab 02/16/19 1115  WBC 6.4  RBC 4.08  HGB 11.3*  HCT 36.4  MCV 89.2  MCH 27.7  MCHC 31.0  RDW 14.4  PLT 191    Cardiac Enzymes Recent Labs  Lab 02/16/19 1115  TROPONINI <0.03   No results for input(s): TROPIPOC in the last 168 hours.   BNP Recent Labs  Lab 02/16/19 1115  BNP 1,289.0*     DDimer No results for input(s): DDIMER in the last 168 hours.   Radiology    Dg Chest Portable 1 View  Result Date: 02/16/2019 CLINICAL DATA:  Increased fluid retention and shortness of breath EXAM: PORTABLE CHEST 1 VIEW COMPARISON:  01/25/2019 FINDINGS: Cardiac shadow is enlarged but stable. Loculated fluid is again seen in the region of the minor fissure increased when compare with the prior exam. Small right pleural effusion is noted inferiorly as well. Mild vascular congestion is again noted. No focal infiltrate is seen. No bony abnormality is noted. IMPRESSION: Increasing right pleural effusion with increase in more loculated component in the minor  fissure. Electronically Signed   By: Inez [name] M.D.   On: 02/16/2019 11:21    Cardiac Studies   TTE (11/23/2018):  1. The left ventricle has mild-moderately reduced systolic function, with an ejection fraction of 40-45%. The cavity size was mildly dilated. Left ventricular diastolic Doppler parameters are consistent with pseudonormalization.  2. The right ventricle has normal systolic function. The cavity was normal. There is no increase in right ventricular wall thickness.  3. Left atrial size was mildly dilated.  4. Right atrial size was mildly dilated.  5. The mitral valve is normal in structure.  6. The tricuspid valve is normal in structure.  7. The aortic valve is normal in structure.  8. The pulmonic valve was normal in structure.  Patient Profile     63 y.o. 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  Assessment & Plan    Acute on chronic systolic and diastolic heart failure: Patient still significantly volume overloaded.  Weight is essentially unchanged since yesterday.  I's and O's not accurately recorded.  Increase furosemide to 80 mg IV twice daily.  If inadequate urine output, may need to add metolazone or escalate furosemide further.  Telemetry today shows possible atrial fibrillation, though this was not confirmed on twelve-lead EKG.  We will need to continue monitoring for this, as paroxysmal atrial fibrillation may have contributed to decompensation and HF.  Defer addition of ACE inhibitor/ARB in the setting of acute kidney injury superimposed on chronic kidney disease.  Possible atrial fibrillation: Telemetry suspicious for atrial fibrillation, though twelve-lead EKG earlier today is most consistent with sinus tachycardia (though atypical flutter or atrial tachycardia cannot be excluded).   Continue carvedilol 6.25 mg twice daily.  If there is clear evidence of atrial fibrillation, would recommend anticoagulation moving forward.  Coronary artery disease: No symptoms to suggest worsening coronary insufficiency.  Continue secondary prevention, including DAPT.  If atrial fibrillation noted, would caution against triple therapy.  Aggressive secondary prevention, including high intensity statin therapy.  Acute on chronic kidney injury: Creatinine improving with diuresis, likely as venous congestion improves.  Tinea aggressive diuresis.  Avoid nephrotoxic drugs.    For questions or updates, please contact [place] Please consult [email or web address] for contact info under [facility] Cardiology.     Signed, Nelva Bush, MD  02/17/2019, 5:54 PM

## 2019-02-18 ENCOUNTER — Telehealth: Payer: PPO | Admitting: Family

## 2019-02-18 DIAGNOSIS — I38 Endocarditis, valve unspecified: Secondary | ICD-10-CM

## 2019-02-18 LAB — GLUCOSE, CAPILLARY
Glucose-Capillary: 98 mg/dL (ref 70–99)
Glucose-Capillary: 156 mg/dL — ABNORMAL HIGH (ref 70–99)
Glucose-Capillary: 215 mg/dL — ABNORMAL HIGH (ref 70–99)
Glucose-Capillary: 210 mg/dL — ABNORMAL HIGH (ref 70–99)

## 2019-02-18 LAB — BASIC METABOLIC PANEL
Anion gap: 13 (ref 5–15)
BUN: 48 mg/dL — ABNORMAL HIGH (ref 8–23)
CO2: 31 mmol/L (ref 22–32)
Calcium: 9.3 mg/dL (ref 8.9–10.3)
Chloride: 98 mmol/L (ref 98–111)
Creatinine, Ser: 1.8 mg/dL — ABNORMAL HIGH (ref 0.44–1.00)
GFR calc Af Amer: 34 mL/min — ABNORMAL LOW (ref >60)
GFR calc non Af Amer: 29 mL/min — ABNORMAL LOW (ref >60)
Glucose, Bld: 111 mg/dL — ABNORMAL HIGH (ref 70–99)
Potassium: 3.5 mmol/L (ref 3.5–5.1)
Sodium: 142 mmol/L (ref 135–145)

## 2019-02-18 LAB — MAGNESIUM: Magnesium: 2.2 mg/dL (ref 1.7–2.4)

## 2019-02-18 MED ORDER — GABAPENTIN 600 MG PO TABS
600.0000 mg | ORAL_TABLET | Freq: Every day | ORAL | Status: DC
  Administered 2019-02-18 – 2019-02-22 (×5): 600 mg via ORAL
  Filled 2019-02-18 (×5): qty 1

## 2019-02-18 MED ORDER — METOPROLOL TARTRATE 25 MG PO TABS
12.5000 mg | ORAL_TABLET | Freq: Once | ORAL | Status: AC
  Administered 2019-02-18: 12.5 mg via ORAL
  Filled 2019-02-18: qty 1

## 2019-02-18 MED ORDER — LEVALBUTEROL HCL 0.63 MG/3ML IN NEBU: 0.6300 mg | INHALATION_SOLUTION | Freq: Four times a day (QID) | RESPIRATORY_TRACT | Status: DC | PRN

## 2019-02-18 MED ORDER — METOPROLOL TARTRATE 25 MG PO TABS
25.0000 mg | ORAL_TABLET | Freq: Two times a day (BID) | ORAL | Status: AC
  Administered 2019-02-18 – 2019-02-19 (×3): 25 mg via ORAL
  Filled 2019-02-18 (×3): qty 1

## 2019-02-18 MED ORDER — POTASSIUM CHLORIDE CRYS ER 20 MEQ PO TBCR
20.0000 meq | EXTENDED_RELEASE_TABLET | Freq: Two times a day (BID) | ORAL | Status: AC
  Administered 2019-02-18 (×2): 20 meq via ORAL
  Filled 2019-02-18 (×2): qty 1

## 2019-02-18 MED ORDER — POTASSIUM CHLORIDE CRYS ER 10 MEQ PO TBCR
10.0000 meq | EXTENDED_RELEASE_TABLET | Freq: Once | ORAL | Status: AC
  Administered 2019-02-19: 10 meq via ORAL
  Filled 2019-02-18: qty 1

## 2019-02-18 MED ORDER — GABAPENTIN 600 MG PO TABS
300.0000 mg | ORAL_TABLET | Freq: Every morning | ORAL | Status: DC
  Administered 2019-02-19 – 2019-02-23 (×5): 300 mg via ORAL
  Filled 2019-02-18 (×5): qty 1

## 2019-02-18 MED ORDER — ALBUTEROL SULFATE (2.5 MG/3ML) 0.083% IN NEBU
2.5000 mg | INHALATION_SOLUTION | Freq: Four times a day (QID) | RESPIRATORY_TRACT | Status: DC | PRN
  Administered 2019-02-18: 2.5 mg via RESPIRATORY_TRACT
  Filled 2019-02-18: qty 3

## 2019-02-18 NOTE — Progress Notes (Signed)
[place] [facility] [place] NAME: [name]    MR#:  [id number]  DATE OF BIRTH:  14-Oct-1955  SUBJECTIVE:  slept well overnight. Good urine output. Feels a lot better. Seen by cardiology. Eating well  REVIEW OF SYSTEMS:   Review of Systems  Constitutional: Negative for chills, fever and weight loss.  HENT: Negative for ear discharge, ear pain and nosebleeds.   Eyes: Negative for blurred vision, pain and discharge.  Respiratory: Positive for shortness of breath. Negative for sputum production, wheezing and stridor.   Cardiovascular: Positive for leg swelling. Negative for chest pain, palpitations, orthopnea and PND.  Gastrointestinal: Negative for abdominal pain, diarrhea, nausea and vomiting.  Genitourinary: Negative for frequency and urgency.  Musculoskeletal: Negative for back pain and joint pain.  Neurological: Negative for sensory change, speech change, focal weakness and weakness.  Psychiatric/Behavioral: Negative for depression and hallucinations. The patient is not nervous/anxious.    Tolerating Diet:yes Tolerating PT: ambulatory  DRUG ALLERGIES:  No Known Allergies  VITALS:  Blood pressure 101/67, pulse 76, temperature 98.5 F (36.9 C), temperature source Oral, resp. rate 20, height 5\' 8"  (1.727 m), weight (!) 145.8 kg, SpO2 99 %.  PHYSICAL EXAMINATION:   Physical Exam  GENERAL:  63 y.o.-year-old patient lying in the bed with no acute distress. Morbidly obese EYES: Pupils equal, round, reactive to light and accommodation. No scleral icterus. Extraocular muscles intact.  HEENT: Head atraumatic, normocephalic. Oropharynx and nasopharynx clear.  NECK:  Supple, no jugular venous distention. No thyroid enlargement, no tenderness.  LUNGS: distant breath sounds bilaterally, no wheezing, rales, rhonchi. No use of accessory muscles of respiration.  CARDIOVASCULAR: S1, S2 normal. No murmurs, rubs, or gallops.  ABDOMEN: Soft,  nontender, nondistended. Bowel sounds present. No organomegaly or mass.  EXTREMITIES:++ edema b/l.    NEUROLOGIC: Cranial nerves II through XII are intact. No focal Motor or sensory deficits b/l.   PSYCHIATRIC:  patient is alert and oriented x 3.  SKIN: No obvious rash, lesion, or ulcer.   LABORATORY PANEL:  CBC Recent Labs  Lab 02/16/19 1115  WBC 6.4  HGB 11.3*  HCT 36.4  PLT 191    Chemistries  Recent Labs  Lab 02/16/19 1115  02/18/19 0607  NA 138   < > 142  K 3.8   < > 3.5  CL 95*   < > 98  CO2 29   < > 31  GLUCOSE 171*   < > 111*  BUN 51*   < > 48*  CREATININE 2.03*   < > 1.80*  CALCIUM 9.2   < > 9.3  AST 18  --   --   ALT 7  --   --   ALKPHOS 66  --   --   BILITOT 0.6  --   --    < > = values in this interval not displayed.   Cardiac Enzymes Recent Labs  Lab 02/16/19 1115  TROPONINI <0.03   RADIOLOGY:  Dg Chest Port 1 View  Result Date: 02/17/2019 CLINICAL DATA:  Shortness of breath. EXAM: PORTABLE CHEST 1 VIEW COMPARISON:  Radiograph yesterday, additional priors. FINDINGS: Unchanged cardiomegaly and mediastinal contours. Worsening pulmonary edema since yesterday. Rounded densities in the right mid lung consistent with fluid in the fissures, slightly more focal appearing than on prior exam. Subpulmonic effusions, right greater than left. No pneumothorax. IMPRESSION: CHF with increasing pulmonary edema. Pleural effusions and loculated fluid in the right minor fissure appears similar to  radiographs yesterday. Electronically Signed   By: Keith Rake M.D.   On: 02/17/2019 22:43   ASSESSMENT AND PLAN:  [name]  is a 63 y.o. female with a known history of chronic systolic congestive heart failure, CAD, morbid obesity comes to the emergency room with increasing shortness of breath and weight gain more than 20 pounds in the last few days.  1. Acute on chronic systolic congestive heart failure -patient came in with increasing shortness of breath, weight  gain more than 40 pounds, elevated BNP, paroxysmal nocturnal dyspnea and hypoxia -admit on telemetry floor -IV Lasix 80 mg BID and metolazone 5 mg po qd -cardiology consultation-- Dr. end consult appreciated -continue other cardiac meds -monitor daily weight, input output  2. Chronic kidney disease stage III -monitor metabolic panel since patient is on IV diuretics -avoid nephrotoxins -creat better today after IV lasix  3. Hypertension continue home meds  4. Type II diabetes -continue sliding scale insulin and Lantus. -Adjust insulin according to his sugars in house -hold glyburide  5. DVT prophylaxis subcu heparin  Case discussed with Care Management/Social Worker. Management plans discussed with the patient, family and they are in agreement.  CODE STATUS: DNR DVT Prophylaxis: heparin  TOTAL TIME TAKING CARE OF THIS PATIENT: *30* minutes.  >50% time spent on counselling and coordination of care  POSSIBLE D/C IN *1-2* DAYS, DEPENDING ON CLINICAL CONDITION.  Note: This dictation was prepared with Dragon dictation along with smaller phrase technology. Any transcriptional errors that result from this process are unintentional.  Fritzi Mandes M.D on 02/18/2019 at 1:11 PM  Between 7am to 6pm - Pager - [phone]  After 6pm go to [email or web address] - password [email or web address] [facility]  Office  [phone]  CC: Primary care physician; Rutherford Guys, MDPatient ID: [name], female   DOB: April 18, 1956, 63 y.o.   MRN: [id number]

## 2019-02-18 NOTE — Progress Notes (Signed)
Cardiovascular and Pulmonary Nurse Navigator:   63 y.o.femalewith a known history of chronic systolic congestive heart failure, HTN,  DM CAD, CKD III, morbid obesity comes to the emergency room with increasing shortness of breath and weight gain more than 20 pounds in the last few days.  Patient came in with increasing shortness of breath, weight gain more than 40 pounds, elevated BNP (1289), paroxysmal nocturnal dyspnea and hypoxia.  Dr. Saunders Revel, Cardiologist following patient.    CHF Education:?? Rounded on patient.  Patient sitting in bed with HOB elevated with oxygen via [place].  Note CHF is not a new diagnosis for this patient.   Educational session with patient completed.  Provided patient with "Living Better with Heart Failure" packet. Briefly reviewed definition of heart failure and signs and symptoms of an exacerbation.?Explained to patient that HF is a chronic illness which requires self-assessment / self-management along with help from the cardiologist/PCP.?? ? *Reviewed importance of and reason behind checking weight daily in the AM, after using the bathroom, but before getting dressed. Patient has functioning scale and weighs daily.  ? *Reviewed with patient the following information: *Discussed when to call the Dr= weight gain of >2-3lb overnight of 5lb in a week,  *Discussed yellow zone= call MD: weight gain of >2-3lb overnight of 5lb in a week, increased swelling, increased SOB when lying down, chest discomfort, dizziness, increased fatigue *Red Zone= call 911: struggle to breath, fainting or near fainting, significant chest pain   Stressed the importance of reaching out to the MD for increase of  2 to 3 pounds or 5 pound of weight gain over a week instead of waiting until 20 to 40 pounds up.   Explained to patient early communication with PCP / cardiologist is key in managing HF.    Heart Failure Zone Magnet given and reviewed with patient.    *Diet - *Reviewed low sodium  diet-provided handout of recommended and not recommended foods.? Dietitian Consultation referral ordered for education / re-education on low sodium heart healthy carb modified diet.   ? *Discussed fluid intake with patient as well. Patient not currently on a fluid restriction, but advised no more than 8-8 ounces glass of fluids per day.? ? *Instructed patient to take medications as prescribed for heart failure. Explained briefly why pt is on the medications (either make you feel better, live longer or keep you out of the hospital) and discussed monitoring and side effects.  ? *Discussed exercise. Patient does not exercise per se due to SOB, but reports she stays busy running errands for herself and her mother.  Informed patient under Medicare Guidelines with an EF of 45% and with dx of CHF and OSA, patient is a candidate for Pulmonary Rehab.  Patient reports that she has never participated in Cardiac nor Pulmonary Rehab before.  Also, patient stated if her insurance does not cover this she would not be able to manage paying any co-pays for Pulmonary Rehab due to financial constraints.   ? *Smoking Cessation- Patient is a NEVER smoker.? ? *[facility] -  Patient is established in the [facility].  Next appt (Virtual appointment) scheduled for 02/25/2019 at 10:40 a.m.    Again, the 5 Steps to Living Better with Heart Failure were reviewed with patient.  ? Patient thanked me for providing the above information. ? ? Roanna Epley, RN, BSN, [facility]? [place] [facility] Re[facility]  Cardiovascular &?Pulmonary Nurse Navigator  Direct Line: [phone]  Department Phone #:  [phone] Fax: [phone]? Email Address: [email or web address]

## 2019-02-18 NOTE — TOC Initial Note (Signed)
Transition of Care [facility]) - Initial/Assessment Note    Patient Details  Name: [name] MRN: [id number] Date of Birth: [birth year]  Transition of Care [facility]) CM/SW Contact:    Elza Rafter, RN Phone Number: 02/18/2019, 11:43 AM  Clinical Narrative:         Patient is from home with spouse and family.  She has been admitted with SOB and fluid overload.  Has a CPAP machine at home and has not used oxygen at home since using her machine.  Has a functioning scale and weighs herself.  Current with PCP.  Obtains medications without difficulty at [facility].  Current with heart failure clinic. States she drinks 2 cups of coffee and 3 liters and water a day.  Has been coughing up green phlegm for 2-3 weeks.  Open to [place] for RN.  Will notify Corene Cornea with [facility] at discharge.             Expected Discharge Plan: [place] Barriers to Discharge: Continued Medical Work up   Patient Goals and CMS Choice Patient states their goals for this hospitalization and ongoing recovery are:: return home with [facility] CMS Medicare.gov Compare Post Acute Care list provided to:: Patient Choice offered to / list presented to : Patient  Expected Discharge Plan and Services Expected Discharge Plan: [place]   Discharge Planning Services: CM Consult, HF Clinic   Living arrangements for the past 2 months: Single Family Home Expected Discharge Date: 02/18/19                         HH Arranged: RN [place] Agency: [place] ([place]) Date [place]: 02/18/19 Time [place]: 1135 Representative spoke with at [place]: Corene Cornea  Prior Living Arrangements/Services Living arrangements for the past 2 months: [place] with:: Spouse, Adult Children Patient language and need for interpreter reviewed:: Yes Do you feel safe going back to the place where you live?: Yes            Criminal  Activity/Legal Involvement Pertinent to Current Situation/Hospitalization: No - Comment as needed  Activities of Daily Living Home Assistive Devices/Equipment: Shower chair with back, Cane (specify quad or straight), Walker (specify type), Wheelchair, Raised toilet seat with rails, Eyeglasses, CBG Meter, Scales, Blood pressure cuff ADL Screening (condition at time of admission) Patient's cognitive ability adequate to safely complete daily activities?: Yes Is the patient deaf or have difficulty hearing?: No Does the patient have difficulty seeing, even when wearing glasses/contacts?: No Does the patient have difficulty concentrating, remembering, or making decisions?: No Patient able to express need for assistance with ADLs?: Yes Does the patient have difficulty dressing or bathing?: No Independently performs ADLs?: Yes (appropriate for developmental age) Does the patient have difficulty walking or climbing stairs?: Yes Weakness of Legs: Both Weakness of Arms/Hands: None  Permission Sought/Granted Permission sought to share information with : Facility Art therapist granted to share information with : Yes, Verbal Permission Granted     Permission granted to share info w AGENCY: Corene Cornea with [facility]        Emotional Assessment Appearance:: Appears stated age Attitude/Demeanor/Rapport: Gracious Affect (typically observed): Accepting Orientation: : Oriented to Self, Oriented to Place, Oriented to  Time, Oriented to Situation, Fluctuating Orientation (Suspected and/or reported Sundowners) Alcohol / Substance Use: Not Applicable    Admission diagnosis:  SOB (shortness of breath) [R06.02] Hypoxia [  R09.02] Congestive heart failure, unspecified HF chronicity, unspecified heart failure type [facility]) [I50.9] Patient Active Problem List   Diagnosis Date Noted  . AKI (acute kidney injury) ([place])   . Weakness generalized 12/22/2018  . Shortness of breath 12/22/2018  .  Palliative care encounter 12/22/2018  . Endocarditis 12/14/2018  . NSTEMI (non-ST elevated myocardial infarction) ([place]) 11/22/2018  . Acute exacerbation of CHF (congestive heart failure) ([place]) 10/31/2018  . Acute on chronic systolic CHF (congestive heart failure) ([place]) 10/28/2018  . Lymphedema 10/28/2018  . History of amputation of lesser toe of right foot ([place]) 04/19/2018  . 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 Paraparesis of both lower limbs ([place]) 03/29/2018  . 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 Paroxysmal atrial fibrillation ([place]) 03/29/2018  . Class 3 obesity due to excess calories with serious comorbidity and body mass index (BMI) of 40.0 to 44.9 in adult 09/02/2016  . History of CVA (cerebrovascular accident) 11/11/2015  . Chronic renal insufficiency 08/04/2015  . OSA (obstructive sleep apnea) 08/15/2014  . Morbid obesity ([place]) 10/26/2013  . Essential hypertension, benign 04/29/2013  . Pure hypercholesterolemia 12/07/2012  . Iron deficiency anemia 12/07/2012  . Diabetic peripheral neuropathy associated with type 2 diabetes mellitus ([place]) 12/07/2012  . Chronic systolic congestive heart failure ([place]) 07/29/2012  . Left bundle-branch block 07/25/2012  . CAD in native artery 07/25/2012   PCP:  Rutherford Guys, MD Pharmacy:   [facility] [place], [address] [place] [address] Phone: [phone] Fax: [place], [place] [address] Phone: [phone] Fax: [phone]     Social Determinants of Health (SDOH) Interventions    Readmission Risk Interventions Readmission Risk Prevention Plan 02/18/2019  Transportation Screening Complete  Medication Review ([place]) Complete  PCP or Specialist appointment within 3-5 days of discharge Complete  HRI or [place]  Complete  SW Recovery Care/Counseling Consult Patient refused  Palliative Care Screening Not [place] Not Applicable  Some recent data might be hidden

## 2019-02-18 NOTE — Progress Notes (Signed)
Progress Note  Patient Name: [name] Date of Encounter: 02/18/2019  Primary Cardiologist: Ida Rogue, MD   Subjective   Still SOB, though improving. No cardiac complaint of chest pain or palpitations. Does not yet feel that she is back at her dry weight.  She felt her racing heart rate this morning with telemetry and yesterday's repeat EKG suggestive of atrial tachycardia which could be exacerbating her heart failure and thus contributing to her volume overload.   We discussed starting her on BB in setting of atrial tachycardia, as well as reducing her volume intake with patient agreement as she is eager to get fluid off and go home to her 3 grandchildren and 1 great-grandchild.   Inpatient Medications    Scheduled Meds:  aspirin EC  81 mg Oral Daily   calcium-vitamin D  1 tablet Oral BID   clopidogrel  75 mg Oral Daily   ferrous sulfate  325 mg Oral BID WC   furosemide  80 mg Intravenous BID   gabapentin  1,200 mg Oral QHS   gabapentin  600 mg Oral q morning - 10a   heparin  5,000 Units Subcutaneous Q8H   insulin aspart  0-5 Units Subcutaneous QHS   insulin aspart  0-9 Units Subcutaneous TID WC   insulin glargine  12 Units Subcutaneous QHS   metolazone  5 mg Oral Daily   metoprolol tartrate  25 mg Oral BID   multivitamin with minerals  1 tablet Oral Daily   omega-3 acid ethyl esters  1 g Oral Daily   rosuvastatin  40 mg Oral Daily   sodium chloride flush  3 mL Intravenous Q12H   Continuous Infusions:  sodium chloride     PRN Meds: sodium chloride, acetaminophen, ondansetron (ZOFRAN) IV, sodium chloride flush, traMADol   Vital Signs    Vitals:   02/17/19 1938 02/18/19 0001 02/18/19 0402 02/18/19 0753  BP: 114/66  116/71 101/67  Pulse: 93 87 87 76  Resp: 19 18 18 20   Temp: 99.2 F (37.3 C)  97.7 F (36.5 C) 98.5 F (36.9 C)  TempSrc: Oral  Oral Oral  SpO2: 99% 97% 100% 99%  Weight:   (!) 145.8 kg   Height:         Intake/Output Summary (Last 24 hours) at 02/18/2019 1202 Last data filed at 02/18/2019 0942 Gross per 24 hour  Intake 720 ml  Output 2900 ml  Net -2180 ml   Filed Weights   02/16/19 1546 02/17/19 0005 02/18/19 0402  Weight: (!) 144.6 kg (!) 146 kg (!) 145.8 kg    Telemetry    5/13: AM rates elevated with telemetry and EKG suggestive of atrial tachycardia.  5/14: SR, rates mid 71s- low 90s, brief increases in HR at rest - Personally Reviewed  ECG    No new tracings today- Personally Reviewed  Physical Exam   GEN:  Obese female.  No acute distress.  Lying in bed and watching TV Neck:  +JVD Cardiac:  Distant heart sounds, RRR , no murmurs, rubs, or gallops.  Respiratory: Coarse breath sounds bilaterally but otherwise clear  GI:  Obese.  Soft, nontender, non-distended  MS:  minimal to 1+ bilateral edema; No deformity. Neuro:  Nonfocal  Psych: Normal affect   Labs    Chemistry Recent Labs  Lab 02/16/19 1115 02/17/19 0351 02/18/19 0607  NA 138 139 142  K 3.8 3.6 3.5  CL 95* 97* 98  CO2 29 30 31   GLUCOSE 171* 148* 111*  BUN 51* 50* 48*  CREATININE 2.03* 1.88* 1.80*  CALCIUM 9.2 8.9 9.3  PROT 7.7  --   --   ALBUMIN 4.0  --   --   AST 18  --   --   ALT 7  --   --   ALKPHOS 66  --   --   BILITOT 0.6  --   --   GFRNONAA 25* 28* 29*  GFRAA 29* 32* 34*  ANIONGAP 14 12 13      Hematology Recent Labs  Lab 02/16/19 1115  WBC 6.4  RBC 4.08  HGB 11.3*  HCT 36.4  MCV 89.2  MCH 27.7  MCHC 31.0  RDW 14.4  PLT 191    Cardiac Enzymes Recent Labs  Lab 02/16/19 1115  TROPONINI <0.03   No results for input(s): TROPIPOC in the last 168 hours.   BNP Recent Labs  Lab 02/16/19 1115  BNP 1,289.0*     DDimer No results for input(s): DDIMER in the last 168 hours.   Radiology    Dg Chest Port 1 View  Result Date: 02/17/2019 CLINICAL DATA:  Shortness of breath. EXAM: PORTABLE CHEST 1 VIEW COMPARISON:  Radiograph yesterday, additional priors. FINDINGS:  Unchanged cardiomegaly and mediastinal contours. Worsening pulmonary edema since yesterday. Rounded densities in the right mid lung consistent with fluid in the fissures, slightly more focal appearing than on prior exam. Subpulmonic effusions, right greater than left. No pneumothorax. IMPRESSION: CHF with increasing pulmonary edema. Pleural effusions and loculated fluid in the right minor fissure appears similar to radiographs yesterday. Electronically Signed   By: Keith Rake M.D.   On: 02/17/2019 22:43    Cardiac Studies   Limited echo 12/11/2018  Limited study to evaluate valves  Moderately decreased left ventricular systolic function, ejection  fraction 40%  Inferolateral and possible anterolateral wall motion abnormality (see  details below)  Elevated pulmonary artery systolic pressure - mild  Tricuspid regurgitation - mild  Normal right ventricular systolic function  No apparent tricuspid valve or device lead vegetation Globally reduced LV function similar to prior study 12/08/18. Wall motion  abnormalities are better evaluated on that study.   Echo 12/08/2018 Moderately decreased left ventricular systolic function, ejection  fraction 40%  Segmental wall motion abnormality - (inferior / posterior)  Mitral annular calcification  Mitral regurgitation - mild  Dilated left atrium - mild  Aortic sclerosis  Normal right ventricular systolic function  Degenerative tricuspid valve disease  Tricuspid regurgitation - mild  Elevated pulmonary artery systolic pressure - moderate  The pacemaker lead is incompletely evaluated  Dilated right atrium - mild  No apparent valvular vegetations  12/04/2018 PROCEDURES PERFORMED Aspiration of mobile vegetation on tricuspid valve and RV lead with  angiovac Central line placement FINDINGS Mobile vegetation on tricuspid valve and RV lead, successfully aspirated.  TTE (11/23/2018): 1. The left ventricle has mild-moderately  reduced systolic function, with an ejection fraction of 40-45%. The cavity size was mildly dilated. Left ventricular diastolic Doppler parameters are consistent with pseudonormalization. 2. The right ventricle has normal systolic function. The cavity was normal. There is no increase in right ventricular wall thickness. 3. Left atrial size was mildly dilated. 4. Right atrial size was mildly dilated. 5. The mitral valve is normal in structure. 6. The tricuspid valve is normal in structure. 7. The aortic valve is normal in structure. 8. The pulmonic valve was normal in structure.  LHC 10/05/18  Prox Cx to Dist Cx lesion is 80% stenosed.  Prox LAD-1 lesion  is 40% stenosed.  Prox LAD-2 lesion is 90% stenosed.  Prox LAD to Mid LAD lesion is 80% stenosed.  Mid LAD to Dist LAD lesion is 70% stenosed.  1st Diag lesion is 90% stenosed.  Ost 1st Diag lesion is 80% stenosed.  Mid RCA-1 lesion is 40% stenosed.  Mid RCA-2 lesion is 40% stenosed. Final Conclusions:  Severe disease of LAD and diagonal branch Patent proximal LAD stent with severe stenosis at the distal edge of the stent Remainder of the LAD is also small, diffusely diseased, diagonal branch diffusely diseased -RCA large vessel dominant with mild disease, left circumflex/OM with mild disease proximal region Recommendations:  Case discussed with interventional cardiology, Placing a short stent in the distal edge of the LAD stent may provide little clinical benefit with high risk and may delay surgery on her osteomyelitis/toe infection. Troponin elevation likely secondary to demand ischemia in the setting of febrile, tachycardic, hypertensive on arrival in the setting of bacteremia --Would favor medical management at this time and proceeding with removal of her toe.   TEE 10/02/18 - Left ventricle: Systolic function was mildly reduced. The estimated ejection fraction was in the range of 45% to 50%. - Aortic valve: No  evidence of vegetation. There was trivial regurgitation. - Mitral valve: There was mild regurgitation. - Left atrium: No evidence of thrombus in the atrial cavity or appendage. - Right atrium: No evidence of thrombus in the atrial cavity or appendage. - Atrial septum: Echo contrast study showed no right-to-left atrial level shunt, at baseline or with provocation. - Pulmonic valve: No evidence of vegetation.  Impressions: - No evidence of valve vegetation or wire associated vegetation.  TTE 09/29/18 - Left ventricle: The cavity size was normal. Systolic function was mildly reduced. The estimated ejection fraction was in the range of 45% to 50%. The study is not technically sufficient to allow evaluation of LV diastolic function. - Left atrium: The atrium was mildly dilated. Impressions: - Very suboptimal study. No evidence of vegetations. Consider TEE.  Patient Profile     63 y.o. 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 Posey Pronto.  Assessment & Plan    Acute on Chronic combined systolic and diastolic heart failure -Remains volume overload on exam. Most recent echo as above. EF 40%, Mildly elevated PASP. Suspect underlying atrial tachycardia as below and that may be contributing to heart failure and volume overload.  --Continue IV lasix 80mg  BID for diuresis. Could consider earlier lasix dose today given current volume status. Titrate IV lasix as renal function and electrolytes allow for optimal volume status and until euvolemic on exam. --Continue daily  weights, strict I/Os. Wt last discharge 279lbs. Now 320lbs   321lbs. Document strict I/O.  Net -4.3L for admission. Daily BMET.  Cr1.88    1.80 , BUN 50  48 (baseline Cr 1.14). Closely monitor renal function and electrolytes with diuresis.  --Holding losartan/ARB and other nephrotoxins until renal function stable.  --Lopressor 25mg  BID started for rate control per MD. PTA Coreg discontinued at this time. Titrate Lopressor as needed for rate control and as BP allows. Current BP soft / hypotensive, rates 70-low  90s.   --Education provided regarding volume intake at home / heart failure education provided.  Atrial Tachycardia --Suspect underlying atrial tachycardia. EMR indicating previous PAF diagnosis; however, not on PTA anticoagulation and no clear evidence of Afib yet this admission.  --Telemetry suspicious for possible atrial tachycardia, which may be contributing to heart failure as above. EKG yesterday not consistent with atrial fibrillation but suspicious for atrial tachycardial or atypical flutter.  --No recommendation for anticoagulation at this time given no clear evidence of atrial fibrillation. CHA2DS2VASc score of at least 5 (HTN, CAD, female, age, DM2) with recommendation for long term anticoagulation if Afib captured on telemetry / EKG. --Consider Zio at discharge to further assess for underlying Afib.  --Continue Lopressor for rate control, PTA carvedilol discontinued at this time per MD.   CAD -No current chest pain. Known severe disease.  Previous cardiac catheterization 10/05/2018 with chronic occlusion of LAD and intervention/PCI not recommended at that time.  Troponin negative. 4/29 LDL 25 and at goal of below 70.  - No plan for ischemic workup this admission - Continue medical management with DAPT and statin. Holding ACE/ARB until renal function at baseline / stable. Continue Lopressor as above for rate control with carvedilol discontinued per MD at this time. If anticoagulation  needed for clearly identified Afib, would advise discontinue ASA to avoid triple therapy.   HTN, controlled - Currently controlled with soft BP and SBP low 100s. Continue BB as above and titrate as needed for optimal HR and BP control. Holding ARB/losartan until improved renal function as above.   Acute on CKDIII - Reduced renal function (Baseline Cr 1.14 as above) in setting of significant volume overload at presentation but improving with diuresis.  Continue to hold ARB for now. Daily BMET and close monitoring of renal function and electrolytes with IV diuresis. Avoid contrast procedures, nephrotoxins.   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  I - Not pacemaker dependent. Telemetry as above with patient maintaining SR, suspicion for AT. No recommendation for Lifevest at this time. As above, previously felt not an ideal candidate for second ICD implantation. - Recommend continued outpatient follow-up with Dr. Caryl Comes   Anemia, mild - Monitor as above.  PTA iron supplementation.  Hypokalemia - K 3.6  3.5. Replete with goal 4.0. Check Mg. AM BMET.  DM2 -SSI, SSI. Per IM. A1C checked 4/29 and 5.6  For questions or updates, please contact [place] Please consult [email or web address] for contact info under     For questions or updates, please contact [place] Please consult [email or web address] for contact info under        Signed, Arvil Chaco, PA-C  02/18/2019, 12:02 PM

## 2019-02-18 NOTE — Progress Notes (Signed)
Pt c/o mild SOB and had some slight wheezes on auscultation. MD made aware and a breathing tx requested. MD ordered chest Xray to evaluate current pulmonary edema. MD ordered 5mg  metolazone based on xray showing worsening edema.

## 2019-02-19 LAB — BASIC METABOLIC PANEL
Anion gap: 10 (ref 5–15)
BUN: 47 mg/dL — ABNORMAL HIGH (ref 8–23)
CO2: 32 mmol/L (ref 22–32)
Calcium: 9.3 mg/dL (ref 8.9–10.3)
Chloride: 96 mmol/L — ABNORMAL LOW (ref 98–111)
Creatinine, Ser: 1.62 mg/dL — ABNORMAL HIGH (ref 0.44–1.00)
GFR calc Af Amer: 38 mL/min — ABNORMAL LOW (ref >60)
GFR calc non Af Amer: 33 mL/min — ABNORMAL LOW (ref >60)
Glucose, Bld: 146 mg/dL — ABNORMAL HIGH (ref 70–99)
Potassium: 3.7 mmol/L (ref 3.5–5.1)
Sodium: 138 mmol/L (ref 135–145)

## 2019-02-19 LAB — GLUCOSE, CAPILLARY
Glucose-Capillary: 124 mg/dL — ABNORMAL HIGH (ref 70–99)
Glucose-Capillary: 202 mg/dL — ABNORMAL HIGH (ref 70–99)
Glucose-Capillary: 208 mg/dL — ABNORMAL HIGH (ref 70–99)
Glucose-Capillary: 230 mg/dL — ABNORMAL HIGH (ref 70–99)

## 2019-02-19 MED ORDER — METOPROLOL SUCCINATE ER 50 MG PO TB24
50.0000 mg | ORAL_TABLET | Freq: Every day | ORAL | Status: DC
  Administered 2019-02-20 – 2019-02-23 (×4): 50 mg via ORAL
  Filled 2019-02-19 (×4): qty 1

## 2019-02-19 NOTE — Progress Notes (Signed)
Progress Note  Patient Name: [name] Date of Encounter: 02/19/2019  Primary Cardiologist: Ida Rogue, MD  Subjective   Was sob last night but improved after breathing treatment.  Overall, she feels like breathing is improving.  No chest pain.  Inpatient Medications    Scheduled Meds: . aspirin EC  81 mg Oral Daily  . calcium-vitamin D  1 tablet Oral BID  . clopidogrel  75 mg Oral Daily  . ferrous sulfate  325 mg Oral BID WC  . furosemide  80 mg Intravenous BID  . gabapentin  300 mg Oral q morning - 10a  . gabapentin  600 mg Oral QHS  . heparin  5,000 Units Subcutaneous Q8H  . insulin aspart  0-5 Units Subcutaneous QHS  . insulin aspart  0-9 Units Subcutaneous TID WC  . insulin glargine  12 Units Subcutaneous QHS  . metolazone  5 mg Oral Daily  . metoprolol tartrate  25 mg Oral BID  . multivitamin with minerals  1 tablet Oral Daily  . omega-3 acid ethyl esters  1 g Oral Daily  . potassium chloride  10 mEq Oral Once  . rosuvastatin  40 mg Oral Daily  . sodium chloride flush  3 mL Intravenous Q12H   Continuous Infusions: . sodium chloride     PRN Meds: sodium chloride, acetaminophen, levalbuterol, ondansetron (ZOFRAN) IV, sodium chloride flush, traMADol   Vital Signs    Vitals:   02/18/19 2153 02/19/19 0014 02/19/19 0428 02/19/19 0729  BP: 128/81  107/67 100/70  Pulse: (!) 115 (!) 106 100 72  Resp:  20 16 20   Temp:   97.8 F (36.6 C) (!) 97.5 F (36.4 C)  TempSrc:   Oral Oral  SpO2:  95% 93% 98%  Weight:   (!) 140.6 kg   Height:        Intake/Output Summary (Last 24 hours) at 02/19/2019 0943 Last data filed at 02/19/2019 0430 Gross per 24 hour  Intake 240 ml  Output 3550 ml  Net -3310 ml   Filed Weights   02/17/19 0005 02/18/19 0402 02/19/19 0428  Weight: (!) 146 kg (!) 145.8 kg (!) 140.6 kg    Physical Exam   GEN: Well nourished, well developed, in no acute distress.  HEENT: Grossly normal.  Neck: Supple, JVD to jaw.  No carotid  bruits, or masses. Cardiac: RRR, no murmurs, rubs, or gallops. No clubbing, cyanosis, trace to 1+ bilateral lower extremity edema.  Radials/DP/PT 2+ and equal bilaterally.  Respiratory:  Respirations regular and unlabored, bibasilar crackles.   GI: Semifirm, nontender, BS + x 4. MS: no deformity or atrophy. Skin: warm and dry, no rash. Neuro:  Strength and sensation are intact. Psych: AAOx3.  Normal affect.  Labs    Chemistry Recent Labs  Lab 02/16/19 1115 02/17/19 0351 02/18/19 0607 02/19/19 0502  NA 138 139 142 138  K 3.8 3.6 3.5 3.7  CL 95* 97* 98 96*  CO2 29 30 31  32  GLUCOSE 171* 148* 111* 146*  BUN 51* 50* 48* 47*  CREATININE 2.03* 1.88* 1.80* 1.62*  CALCIUM 9.2 8.9 9.3 9.3  PROT 7.7  --   --   --   ALBUMIN 4.0  --   --   --   AST 18  --   --   --   ALT 7  --   --   --   ALKPHOS 66  --   --   --   BILITOT 0.6  --   --   --  GFRNONAA 25* 28* 29* 33*  GFRAA 29* 32* 34* 38*  ANIONGAP 14 12 13 10      Hematology Recent Labs  Lab 02/16/19 1115  WBC 6.4  RBC 4.08  HGB 11.3*  HCT 36.4  MCV 89.2  MCH 27.7  MCHC 31.0  RDW 14.4  PLT 191    Cardiac Enzymes Recent Labs  Lab 02/16/19 1115  TROPONINI <0.03      BNP Recent Labs  Lab 02/16/19 1115  BNP 1,289.0*       Radiology    Dg Chest Port 1 View  Result Date: 02/17/2019 CLINICAL DATA:  Shortness of breath. EXAM: PORTABLE CHEST 1 VIEW COMPARISON:  Radiograph yesterday, additional priors. FINDINGS: Unchanged cardiomegaly and mediastinal contours. Worsening pulmonary edema since yesterday. Rounded densities in the right mid lung consistent with fluid in the fissures, slightly more focal appearing than on prior exam. Subpulmonic effusions, right greater than left. No pneumothorax. IMPRESSION: CHF with increasing pulmonary edema. Pleural effusions and loculated fluid in the right minor fissure appears similar to radiographs yesterday. Electronically Signed   By: Keith Rake M.D.   On: 02/17/2019 22:43     Telemetry    In sinus rhythm on bedside monitor-personally reviewed.  Central telemetry is not available for review currently.  Cardiac Studies   LHC 10/05/18  Prox Cx to Dist Cx lesion is 80% stenosed.  Prox LAD-1 lesion is 40% stenosed.  Prox LAD-2 lesion is 90% stenosed.  Prox LAD to Mid LAD lesion is 80% stenosed.  Mid LAD to Dist LAD lesion is 70% stenosed.  1st Diag lesion is 90% stenosed.  Ost 1st Diag lesion is 80% stenosed.  Mid RCA-1 lesion is 40% stenosed.  Mid RCA-2 lesion is 40% stenosed. Final Conclusions:  Severe disease of LAD and diagonal branch Patent proximal LAD stent with severe stenosis at the distal edge of the stent Remainder of the LAD is also small, diffusely diseased, diagonal branch diffusely diseased -RCA large vessel dominant with mild disease, left circumflex/OM with mild disease proximal region Recommendations:  Case discussed with interventional cardiology, Placing a short stent in the distal edge of the LAD stent may provide little clinical benefit with high risk and may delay surgery on her osteomyelitis/toe infection. Troponin elevation likely secondary to demand ischemia in the setting of febrile, tachycardic, hypertensive on arrival in the setting of bacteremia --Would favor medical management at this time and proceeding with removal of her toe.  _____________   12/04/2018 PROCEDURES PERFORMED Aspiration of mobile vegetation on tricuspid valve and RV lead with  angiovac Central line placement FINDINGS Mobile vegetation on tricuspid valve and RV lead, successfully aspirated. _____________  Limited echo 12/11/2018  Limited study to evaluate valves  Moderately decreased left ventricular systolic function, ejection  fraction 40%  Inferolateral and possible anterolateral wall motion abnormality (see  details below)  Elevated pulmonary artery systolic pressure - mild  Tricuspid regurgitation - mild  Normal right ventricular  systolic function  No apparent tricuspid valve or device lead vegetation Globally reduced LV function similar to prior study 12/08/18. Wall motion  abnormalities are better evaluated on that study.    Patient Profile     63 y.o. female with a history of CAD, left bundle branch block, suspicion for intermittent/underlying atrial tachycardia, mixed ischemic and nonischemic cardiomyopathy with chronic combined heart failure (EF 40% by echo March 2020), type 2 diabetes mellitus, hypertension, obesity, sleep apnea, and MSSA bacteremia status post ICD explant @ [facility] 11/2018, who was admitted May  12 secondary to worsening heart failure.  Assessment & Plan    1.  Acute on chronic combined syst/diast CHF/ICM: Admitted May 12 with worsening edema in the setting of presumed atrial tachycardia.  EF 40% by echocardiogram in March.  She has been diuresing well and is -3.5 L overnight and 7.6 L for admission.  Her weight is recorded as being down 5 kg since yesterday-question accuracy.  She remains volume overloaded with JVD, some increase in abdominal girth, and trace to 1+ bilateral lower extremity edema.  Renal function stable.  Continue current dose of IV Lasix-80 mg twice daily, in addition to metolazone and beta-blocker therapy.  No ACE/ARB/ARNI/MRA in the setting of chronic kidney disease.  2.  Atrial tachycardia: Currently in sinus rhythm.  Such telemetry not currently available for review.  Heart rate was recorded at 100 at 428 this morning and 106 at midnight.  She denies any palpitations.  Continue beta-blocker therapy.  Carvedilol was switched to long-acting metoprolol in the setting of soft blood pressures yesterday.  We will likely place Zio monitor at discharge to further document arrhythmias and assess for A. Fib, and EP follow-up.  3.  Coronary artery disease: Known, severe disease on catheterization in December 2019 with a chronic occlusion of the LAD, which has been medically managed.  She has not  been having any chest pain.  She remains on dual antiplatelet therapy, beta-blocker, and statin.  4.  Acute on chronic stage III chronic kidney disease: Creatinine elevated at 2.03 on admission.  This has improved with diuresis and is 1.62 this morning.  Continue to follow with diuresis.  5.  History of endocarditis with MSSA bacteremia: Recurrent MSSA bacteremia with osteomyelitis of the foot.  Status post ICD removal at [facility] in March.  Outpatient follow-up with Dr. Caryl Comes.   Signed, Murray Hodgkins, NP  02/19/2019, 9:43 AM    For questions or updates, please contact   Please consult [email or web address] for contact info under Cardiology/STEMI.

## 2019-02-19 NOTE — Progress Notes (Signed)
[place] [facility] [place] NAME: [name]    MR#:  [id number]  DATE OF BIRTH:  September 10, 1956  SUBJECTIVE:  slept well overnight. Good urine output. Feels a lot better. Seen by cardiology. Eating well  REVIEW OF SYSTEMS:   Review of Systems  Constitutional: Negative for chills, fever and weight loss.  HENT: Negative for ear discharge, ear pain and nosebleeds.   Eyes: Negative for blurred vision, pain and discharge.  Respiratory: Positive for shortness of breath. Negative for sputum production, wheezing and stridor.   Cardiovascular: Positive for leg swelling. Negative for chest pain, palpitations, orthopnea and PND.  Gastrointestinal: Negative for abdominal pain, diarrhea, nausea and vomiting.  Genitourinary: Negative for frequency and urgency.  Musculoskeletal: Negative for back pain and joint pain.  Neurological: Negative for sensory change, speech change, focal weakness and weakness.  Psychiatric/Behavioral: Negative for depression and hallucinations. The patient is not nervous/anxious.    Tolerating Diet:yes Tolerating PT: ambulatory  DRUG ALLERGIES:  No Known Allergies  VITALS:  Blood pressure 100/70, pulse 72, temperature (!) 97.5 F (36.4 C), temperature source Oral, resp. rate 20, height 5\' 8"  (1.727 m), weight (!) 140.6 kg, SpO2 98 %.  PHYSICAL EXAMINATION:   Physical Exam  GENERAL:  63 y.o.-year-old patient lying in the bed with no acute distress. Morbidly obese EYES: Pupils equal, round, reactive to light and accommodation. No scleral icterus. Extraocular muscles intact.  HEENT: Head atraumatic, normocephalic. Oropharynx and nasopharynx clear.  NECK:  Supple, no jugular venous distention. No thyroid enlargement, no tenderness.  LUNGS: distant breath sounds bilaterally, no wheezing, rales, rhonchi. No use of accessory muscles of respiration.  CARDIOVASCULAR: S1, S2 normal. No murmurs, rubs, or gallops.  ABDOMEN: Soft,  nontender, nondistended. Bowel sounds present. No organomegaly or mass.  EXTREMITIES:++ edema b/l.    NEUROLOGIC: Cranial nerves II through XII are intact. No focal Motor or sensory deficits b/l.   PSYCHIATRIC:  patient is alert and oriented x 3.  SKIN: No obvious rash, lesion, or ulcer.   LABORATORY PANEL:  CBC Recent Labs  Lab 02/16/19 1115  WBC 6.4  HGB 11.3*  HCT 36.4  PLT 191    Chemistries  Recent Labs  Lab 02/16/19 1115  02/18/19 0607 02/19/19 0502  NA 138   < > 142 138  K 3.8   < > 3.5 3.7  CL 95*   < > 98 96*  CO2 29   < > 31 32  GLUCOSE 171*   < > 111* 146*  BUN 51*   < > 48* 47*  CREATININE 2.03*   < > 1.80* 1.62*  CALCIUM 9.2   < > 9.3 9.3  MG  --   --  2.2  --   AST 18  --   --   --   ALT 7  --   --   --   ALKPHOS 66  --   --   --   BILITOT 0.6  --   --   --    < > = values in this interval not displayed.   Cardiac Enzymes Recent Labs  Lab 02/16/19 1115  TROPONINI <0.03   RADIOLOGY:  Dg Chest Port 1 View  Result Date: 02/17/2019 CLINICAL DATA:  Shortness of breath. EXAM: PORTABLE CHEST 1 VIEW COMPARISON:  Radiograph yesterday, additional priors. FINDINGS: Unchanged cardiomegaly and mediastinal contours. Worsening pulmonary edema since yesterday. Rounded densities in the right mid lung consistent with fluid in the fissures,  slightly more focal appearing than on prior exam. Subpulmonic effusions, right greater than left. No pneumothorax. IMPRESSION: CHF with increasing pulmonary edema. Pleural effusions and loculated fluid in the right minor fissure appears similar to radiographs yesterday. Electronically Signed   By: [name] M.D.   On: 02/17/2019 22:43   ASSESSMENT AND PLAN:  [name]  is a 63 y.o. female with a known history of chronic systolic congestive heart failure, CAD, morbid obesity comes to the emergency room with increasing shortness of breath and weight gain more than 40 pounds in the last few days.  1. Acute on chronic  systolic congestive heart failure -patient came in with increasing shortness of breath, weight gain more than 40 pounds, elevated BNP, paroxysmal nocturnal dyspnea and hypoxia -admit on telemetry floor -IV Lasix 80 mg BID and metolazone 5 mg po qd -cardiology consultation-- Dr. end consult appreciated -continue other cardiac meds -monitor daily weight, input output--moving in the right direction  2. Chronic kidney disease stage III -monitor metabolic panel since patient is on IV diuretics -avoid nephrotoxins -creat better today after IV lasix  3. Hypertension continue home meds  4. Type II diabetes -continue sliding scale insulin and Lantus. -Adjust insulin according to his sugars in house -hold glyburide  5. DVT prophylaxis subcu heparin   CODE STATUS: DNR DVT Prophylaxis: heparin  TOTAL TIME TAKING CARE OF THIS PATIENT: *30* minutes.  >50% time spent on counselling and coordination of care  POSSIBLE D/C IN *1-2* DAYS, DEPENDING ON CLINICAL CONDITION.  Note: This dictation was prepared with Dragon dictation along with smaller phrase technology. Any transcriptional errors that result from this process are unintentional.  [name] M.D on 02/19/2019 at 3:38 PM  Between 7am to 6pm - Pager - [phone]  After 6pm go to [email or web address] - password [email or web address] [facility]  Office  [phone]  CC: Primary care physician; [name], MDPatient ID: [name], female   DOB: 02/03/1956, 63 y.o.   MRN: [id number]

## 2019-02-19 NOTE — Plan of Care (Signed)
Nutrition Education Note  RD consulted for nutrition education regarding CHF.  63 y.o. female with a known history of chronic systolic congestive heart failure, CAD, morbid obesity comes to the emergency room with increasing shortness of breath and weight gain.  Spoke with pt via phone. Pt reports good appetite and oral intake today and pta.   RD provided "Low Sodium Nutrition Therapy" handout from the Academy [employer] via mail. Reviewed patient's dietary recall. Provided examples on ways to decrease sodium intake in diet. Discouraged intake of processed foods and use of salt shaker. Encouraged fresh fruits and vegetables as well as whole grain sources of carbohydrates to maximize fiber intake.   RD discussed why it is important for patient to adhere to diet recommendations, and emphasized the role of fluids, foods to avoid, and importance of weighing self daily. Teach back method used.  Expect fair compliance.  Body mass index is 47.12 kg/m. Pt meets criteria for morbid obesity based on current BMI.  Current diet order is HH, patient is consuming approximately 85-100% of meals at this time. Labs and medications reviewed.   Pt decline ONS today  No further nutrition interventions warranted at this time. RD contact information provided. If additional nutrition issues arise, please re-consult RD.   Koleen Distance MS, RD, LDN Pager #- [phone] Office#- [phone] After Hours Pager: [phone]

## 2019-02-19 NOTE — Care Management Important Message (Signed)
Important Message  Patient Details  Name: [name] MRN: [id number] Date of Birth: October 17, 1955   Medicare Important Message Given:  Yes    Dannette Barbara 02/19/2019, 12:04 PM

## 2019-02-19 NOTE — Progress Notes (Signed)
Inpatient Diabetes Program Recommendations  AACE/ADA: New Consensus Statement on Inpatient Glycemic Control (2015)  Target Ranges:  Prepandial:   less than 140 mg/dL      Peak postprandial:   less than 180 mg/dL (1-2 hours)      Critically ill patients:  140 - 180 mg/dL   Lab Results  Component Value Date   GLUCAP 202 (H) 02/19/2019   HGBA1C 5.6 02/03/2019    Review of Glycemic Control Results for COSANDRA, PLOUFFE (MRN [id number]) as of 02/19/2019 13:16  Ref. Range 02/18/2019 07:54 02/18/2019 11:39 02/18/2019 17:08 02/18/2019 20:57 02/19/2019 07:32 02/19/2019 11:56  Glucose-Capillary Latest Ref Range: 70 - 99 mg/dL 98 156 (H) 215 (H) 210 (H) 124 (H) 202 (H)   Diabetes history: DM 2 Outpatient Diabetes medications:  Amaryl 4 mg bid, Lantus 16 units q HS Current orders for Inpatient glycemic control: Novolog sensitive tid with meals and HS, Lantus 12 units q HS Inpatient Diabetes Program Recommendations:    May consider adding Novolog 3 units tid with meals (hold if patient eats less than 50%).   Thanks  Adah Perl, RN, BC-ADM Inpatient Diabetes Coordinator Pager [phone] (8a-5p)

## 2019-02-19 NOTE — Plan of Care (Signed)
  Problem: Education: Goal: Knowledge of General Education information will improve Description Including pain rating scale, medication(s)/side effects and non-pharmacologic comfort measures Outcome: Progressing   Problem: Health Behavior/Discharge Planning: Goal: Ability to manage health-related needs will improve Outcome: Progressing   Problem: Clinical Measurements: Goal: Ability to maintain clinical measurements within normal limits will improve Outcome: Progressing Goal: Will remain free from infection Outcome: Progressing Goal: Diagnostic test results will improve Outcome: Progressing Goal: Respiratory complications will improve Outcome: Progressing Goal: Cardiovascular complication will be avoided Outcome: Progressing   Problem: Activity: Goal: Risk for activity intolerance will decrease Outcome: Progressing   Problem: Elimination: Goal: Will not experience complications related to bowel motility Outcome: Progressing Goal: Will not experience complications related to urinary retention Outcome: Progressing   Problem: Skin Integrity: Goal: Risk for impaired skin integrity will decrease Outcome: Progressing   Problem: Education: Goal: Ability to demonstrate management of disease process will improve Outcome: Progressing Goal: Ability to verbalize understanding of medication therapies will improve Outcome: Progressing Goal: Individualized Educational Video(s) Outcome: Progressing   Problem: Activity: Goal: Capacity to carry out activities will improve Outcome: Progressing   Problem: Cardiac: Goal: Ability to achieve and maintain adequate cardiopulmonary perfusion will improve Outcome: Progressing

## 2019-02-20 LAB — CBC
HCT: 32.5 % — ABNORMAL LOW (ref 36.0–46.0)
Hemoglobin: 10.1 g/dL — ABNORMAL LOW (ref 12.0–15.0)
MCH: 27.4 pg (ref 26.0–34.0)
MCHC: 31.1 g/dL (ref 30.0–36.0)
MCV: 88.3 fL (ref 80.0–100.0)
Platelets: 178 10*3/uL (ref 150–400)
RBC: 3.68 MIL/uL — ABNORMAL LOW (ref 3.87–5.11)
RDW: 14.2 % (ref 11.5–15.5)
WBC: 5.5 10*3/uL (ref 4.0–10.5)
nRBC: 0 % (ref 0.0–0.2)

## 2019-02-20 LAB — GLUCOSE, CAPILLARY
Glucose-Capillary: 164 mg/dL — ABNORMAL HIGH (ref 70–99)
Glucose-Capillary: 189 mg/dL — ABNORMAL HIGH (ref 70–99)
Glucose-Capillary: 207 mg/dL — ABNORMAL HIGH (ref 70–99)
Glucose-Capillary: 243 mg/dL — ABNORMAL HIGH (ref 70–99)

## 2019-02-20 LAB — BASIC METABOLIC PANEL
Anion gap: 11 (ref 5–15)
BUN: 53 mg/dL — ABNORMAL HIGH (ref 8–23)
CO2: 32 mmol/L (ref 22–32)
Calcium: 9.4 mg/dL (ref 8.9–10.3)
Chloride: 97 mmol/L — ABNORMAL LOW (ref 98–111)
Creatinine, Ser: 1.68 mg/dL — ABNORMAL HIGH (ref 0.44–1.00)
GFR calc Af Amer: 37 mL/min — ABNORMAL LOW (ref >60)
GFR calc non Af Amer: 32 mL/min — ABNORMAL LOW (ref >60)
Glucose, Bld: 165 mg/dL — ABNORMAL HIGH (ref 70–99)
Potassium: 3.9 mmol/L (ref 3.5–5.1)
Sodium: 140 mmol/L (ref 135–145)

## 2019-02-20 LAB — MAGNESIUM: Magnesium: 2.1 mg/dL (ref 1.7–2.4)

## 2019-02-20 NOTE — Progress Notes (Signed)
[place] at [place] NAME: [name]    MR#:  [id number]  DATE OF BIRTH:  [birth year]  SUBJECTIVE:   Responding well to IV diuresis.  Weight is down, denies any worsening shortness of breath or cough.  REVIEW OF SYSTEMS:   Review of Systems  Constitutional: Negative for chills, fever and weight loss.  HENT: Negative for ear discharge, ear pain and nosebleeds.   Eyes: Negative for blurred vision, pain and discharge.  Respiratory: Positive for shortness of breath. Negative for sputum production, wheezing and stridor.   Cardiovascular: Positive for leg swelling. Negative for chest pain, palpitations, orthopnea and PND.  Gastrointestinal: Negative for abdominal pain, diarrhea, nausea and vomiting.  Genitourinary: Negative for frequency and urgency.  Musculoskeletal: Negative for back pain and joint pain.  Neurological: Negative for sensory change, speech change, focal weakness and weakness.  Psychiatric/Behavioral: Negative for depression and hallucinations. The patient is not nervous/anxious.    Tolerating Diet:yes Tolerating PT: ambulatory  DRUG ALLERGIES:  No Known Allergies  VITALS:  Blood pressure 102/60, pulse 73, temperature 97.7 F (36.5 C), resp. rate 18, height 5\' 8"  (1.727 m), weight (!) 140.3 kg, SpO2 95 %.  PHYSICAL EXAMINATION:   Physical Exam  GENERAL:  63 y.o.-year-old obese patient lying in the bed with no acute distress.  EYES: Pupils equal, round, reactive to light and accommodation. No scleral icterus. Extraocular muscles intact.  HEENT: Head atraumatic, normocephalic. Oropharynx and nasopharynx clear.  NECK:  Supple, + jugular venous distention. No thyroid enlargement, no tenderness.  LUNGS: distant breath sounds bilaterally, no wheezing, rales, rhonchi. No use of accessory muscles of respiration.  CARDIOVASCULAR: S1, S2 normal. No murmurs, rubs, or gallops.  ABDOMEN: Soft, nontender, nondistended.  Bowel sounds present. No organomegaly or mass.  EXTREMITIES: + 2 pitting edema b/l, no cyanosis, clubbing.     NEUROLOGIC: Cranial nerves II through XII are intact. No focal Motor or sensory deficits b/l. Globally weak.    PSYCHIATRIC:  patient is alert and oriented x 3.  SKIN: No obvious rash, lesion, or ulcer.   LABORATORY PANEL:  CBC Recent Labs  Lab 02/20/19 0532  WBC 5.5  HGB 10.1*  HCT 32.5*  PLT 178    Chemistries  Recent Labs  Lab 02/16/19 1115  02/20/19 0532  NA 138   < > 140  K 3.8   < > 3.9  CL 95*   < > 97*  CO2 29   < > 32  GLUCOSE 171*   < > 165*  BUN 51*   < > 53*  CREATININE 2.03*   < > 1.68*  CALCIUM 9.2   < > 9.4  MG  --    < > 2.1  AST 18  --   --   ALT 7  --   --   ALKPHOS 66  --   --   BILITOT 0.6  --   --    < > = values in this interval not displayed.   Cardiac Enzymes Recent Labs  Lab 02/16/19 1115  TROPONINI <0.03   RADIOLOGY:  No results found. ASSESSMENT AND PLAN:  Temprance Wyre  is a 63 y.o. female with a known history of chronic systolic congestive heart failure, CAD, morbid obesity comes to the emergency room with increasing shortness of breath and weight gain more than 40 pounds in the last few days.  1. Acute on chronic systolic congestive heart failure -patient came in with increasing shortness  of breath, weight gain more than 40 pounds, elevated BNP, paroxysmal nocturnal dyspnea and hypoxia - Continue diuresis with IV Lasix and patient is responding to it.  Continue metolazone. -Follow BUN/creatinine and serial electrolytes.  Plan for diuresis for additional 1 to 2 days. -Appreciate cardiology input, continue Toprol.  2. Chronic kidney disease stage III -Creatinine close to Normal continue to monitor with IV diuresis.  3. Hypertension - cont. Toprol.   4. Type II diabetes - cont Lantus, SSI. BS stable.   5.  Hyperlipidemia-continue Crestor.  6.  Diabetic neuropathy-continue gabapentin.  7.  PVD-continue Plavix,  Crestor.   CODE STATUS: DNR DVT Prophylaxis: heparin  TOTAL TIME TAKING CARE OF THIS PATIENT: *30* minutes.   POSSIBLE D/C IN *1-2* DAYS, DEPENDING ON CLINICAL CONDITION.  Note: This dictation was prepared with Dragon dictation along with smaller phrase technology. Any transcriptional errors that result from this process are unintentional.  [name] M.D on 02/20/2019 at 3:31 PM  Between 7am to 6pm - Pager - [phone]  After 6pm go to [email or web address] - password [email or web address] [facility]  Office  [phone]  CC: Primary care physician; Rutherford Guys, MDPatient ID: [name], female   DOB: [birth year], 63 y.o.   MRN: [id number]

## 2019-02-20 NOTE — Progress Notes (Signed)
Progress Note  Patient Name: [name] Date of Encounter: 02/20/2019  Primary Cardiologist: Rockey Situ  Subjective   Documented UOP 1.7 L for the past 24 hours with a net - 9.4 L for the admission. Weight remains elevated at 140.3 kg (140.6 kg on 5/15) with a dry weight around 128-130 kg. Weights have all been obtained via bed scale. Renal function slightly increased this morning with a BUN/SCr trend of 47/1.62-->53/1.68 over the past 24 hours. Potassium 3.9.   SOB continues to improved. No chest pain. Remains mildly orthopneic. Lower extremity swelling improving.   Inpatient Medications    Scheduled Meds:  aspirin EC  81 mg Oral Daily   calcium-vitamin D  1 tablet Oral BID   clopidogrel  75 mg Oral Daily   ferrous sulfate  325 mg Oral BID WC   furosemide  80 mg Intravenous BID   gabapentin  300 mg Oral q morning - 10a   gabapentin  600 mg Oral QHS   heparin  5,000 Units Subcutaneous Q8H   insulin aspart  0-5 Units Subcutaneous QHS   insulin aspart  0-9 Units Subcutaneous TID WC   insulin glargine  12 Units Subcutaneous QHS   metolazone  5 mg Oral Daily   metoprolol succinate  50 mg Oral Daily   multivitamin with minerals  1 tablet Oral Daily   omega-3 acid ethyl esters  1 g Oral Daily   rosuvastatin  40 mg Oral Daily   sodium chloride flush  3 mL Intravenous Q12H   Continuous Infusions:  sodium chloride     PRN Meds: sodium chloride, acetaminophen, levalbuterol, ondansetron (ZOFRAN) IV, sodium chloride flush, traMADol   Vital Signs    Vitals:   02/19/19 0729 02/19/19 2013 02/20/19 0523 02/20/19 0749  BP: 100/70 109/65 93/82 102/60  Pulse: 72 80 71 73  Resp: 20 18 18 18   Temp: (!) 97.5 F (36.4 C) 98 F (36.7 C) 97.7 F (36.5 C) 97.7 F (36.5 C)  TempSrc: Oral  Oral   SpO2: 98% 96% 93% 95%  Weight:   (!) 140.3 kg   Height:        Intake/Output Summary (Last 24 hours) at 02/20/2019 0839 Last data filed at 02/19/2019 2100 Gross per 24  hour  Intake --  Output 1750 ml  Net -1750 ml   Filed Weights   02/18/19 0402 02/19/19 0428 02/20/19 0523  Weight: (!) 145.8 kg (!) 140.6 kg (!) 140.3 kg    Telemetry    NSR - Personally Reviewed  ECG    n/a - Personally Reviewed  Physical Exam   GEN: No acute distress.   Neck: JVD elevated to the jaw. Cardiac: RRR, no murmurs, rubs, or gallops.  Respiratory: Bibasilar crackles.  GI: Soft, nontender, non-distended.   MS: Trace to 1+ bilateral pitting lower extremity edema; No deformity. Neuro:  Alert and oriented x 3; Nonfocal.  Psych: Normal affect.  Labs    Chemistry Recent Labs  Lab 02/16/19 1115  02/18/19 0607 02/19/19 0502 02/20/19 0532  NA 138   < > 142 138 140  K 3.8   < > 3.5 3.7 3.9  CL 95*   < > 98 96* 97*  CO2 29   < > 31 32 32  GLUCOSE 171*   < > 111* 146* 165*  BUN 51*   < > 48* 47* 53*  CREATININE 2.03*   < > 1.80* 1.62* 1.68*  CALCIUM 9.2   < > 9.3 9.3  9.4  PROT 7.7  --   --   --   --   ALBUMIN 4.0  --   --   --   --   AST 18  --   --   --   --   ALT 7  --   --   --   --   ALKPHOS 66  --   --   --   --   BILITOT 0.6  --   --   --   --   GFRNONAA 25*   < > 29* 33* 32*  GFRAA 29*   < > 34* 38* 37*  ANIONGAP 14   < > 13 10 11    < > = values in this interval not displayed.     Hematology Recent Labs  Lab 02/16/19 1115 02/20/19 0532  WBC 6.4 5.5  RBC 4.08 3.68*  HGB 11.3* 10.1*  HCT 36.4 32.5*  MCV 89.2 88.3  MCH 27.7 27.4  MCHC 31.0 31.1  RDW 14.4 14.2  PLT 191 178    Cardiac Enzymes Recent Labs  Lab 02/16/19 1115  TROPONINI <0.03   No results for input(s): TROPIPOC in the last 168 hours.   BNP Recent Labs  Lab 02/16/19 1115  BNP 1,289.0*     DDimer No results for input(s): DDIMER in the last 168 hours.   Radiology    No results found.  Cardiac Studies   LHC 10/05/18  Prox Cx to Dist Cx lesion is 80% stenosed.  Prox LAD-1 lesion is 40% stenosed.  Prox LAD-2 lesion is 90% stenosed.  Prox LAD to Mid LAD  lesion is 80% stenosed.  Mid LAD to Dist LAD lesion is 70% stenosed.  1st Diag lesion is 90% stenosed.  Ost 1st Diag lesion is 80% stenosed.  Mid RCA-1 lesion is 40% stenosed.  Mid RCA-2 lesion is 40% stenosed. Final Conclusions:  Severe disease of LAD and diagonal branch Patent proximal LAD stent with severe stenosis at the distal edge of the stent Remainder of the LAD is also small, diffusely diseased, diagonal branch diffusely diseased -RCA large vessel dominant with mild disease, left circumflex/OM with mild disease proximal region Recommendations:  Case discussed with interventional cardiology, Placing a short stent in the distal edge of the LAD stent may provide little clinical benefit with high risk and may delay surgery on her osteomyelitis/toe infection. Troponin elevation likely secondary to demand ischemia in the setting of febrile, tachycardic, hypertensive on arrival in the setting of bacteremia --Would favor medical management at this time and proceeding with removal of her toe.  _____________   12/04/2018 PROCEDURES PERFORMED Aspiration of mobile vegetation on tricuspid valve and RV lead with  angiovac Central line placement FINDINGS Mobile vegetation on tricuspid valve and RV lead, successfully aspirated. _____________  Limited echo 12/11/2018  Limited study to evaluate valves  Moderately decreased left ventricular systolic function, ejection  fraction 40%  Inferolateral and possible anterolateral wall motion abnormality (see  details below)  Elevated pulmonary artery systolic pressure - mild  Tricuspid regurgitation - mild  Normal right ventricular systolic function  No apparent tricuspid valve or device lead vegetation Globally reduced LV function similar to prior study 12/08/18. Wall motion  abnormalities are better evaluated on that study.   Patient Profile     63 y.o. 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  Assessment & Plan    1. Acute on chronic combined CHF/ICM: -She is minus 1.7 L for the past 24 hours and net - 9.4 L for the admission -Her weight remains elevated at 140.3 kg and essentially unchanged from 5/15 (all weights via bed scale) -Stand patient up to weigh -Dry weight ~ 128-130 kg -Renal function slightly bumped this morning as below, hold PM IV Lasix -She Remains volume up -Consider lower dose IV Lasix on 5/17 -No ACEi/ARB/ARNI/MRA in the setting of CKD -Toprol XL  2. Atrial tachycardia: -Currently maintaining sinus rhythm -Continue Toprol XL -At discharge, consider Zio patch -She will need follow up with EP  3. CAD involving the native coronary arteries without angina: -She has known severe disease by LHC in 09/2018 with CTO of the LAD that has been medically managed -No chest pain -Continue DAPT, Toprol XL, and Crestor  4. Acute on CKD stage III: -Renal function was elevated at time of admission with a SCr of 2.03 which had improved to 1.62 as of 5/15 -Renal function slightly worse this morning as above -She has already received IV Lasix 80 mg and metolazone 5 mg this morning -Will hold PM dose of IV Lasix -Monitor  5. 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 Caryl Comes   For questions or updates, please contact [facility] Please consult [email or web address] for contact info under Cardiology/STEMI.    Signed, Christell Faith, PA-C [facility] Pager: [phone] 02/20/2019, 8:39 AM

## 2019-02-20 NOTE — Plan of Care (Signed)
  Problem: Activity: Goal: Risk for activity intolerance will decrease Outcome: Progressing   Problem: Skin Integrity: Goal: Risk for impaired skin integrity will decrease Outcome: Progressing   

## 2019-02-21 ENCOUNTER — Inpatient Hospital Stay: Payer: PPO

## 2019-02-21 DIAGNOSIS — N184 Chronic kidney disease, stage 4 (severe): Secondary | ICD-10-CM

## 2019-02-21 LAB — BASIC METABOLIC PANEL
Anion gap: 12 (ref 5–15)
BUN: 58 mg/dL — ABNORMAL HIGH (ref 8–23)
CO2: 32 mmol/L (ref 22–32)
Calcium: 9.2 mg/dL (ref 8.9–10.3)
Chloride: 94 mmol/L — ABNORMAL LOW (ref 98–111)
Creatinine, Ser: 1.58 mg/dL — ABNORMAL HIGH (ref 0.44–1.00)
GFR calc Af Amer: 39 mL/min — ABNORMAL LOW (ref >60)
GFR calc non Af Amer: 34 mL/min — ABNORMAL LOW (ref >60)
Glucose, Bld: 189 mg/dL — ABNORMAL HIGH (ref 70–99)
Potassium: 3.6 mmol/L (ref 3.5–5.1)
Sodium: 138 mmol/L (ref 135–145)

## 2019-02-21 LAB — GLUCOSE, CAPILLARY
Glucose-Capillary: 166 mg/dL — ABNORMAL HIGH (ref 70–99)
Glucose-Capillary: 201 mg/dL — ABNORMAL HIGH (ref 70–99)
Glucose-Capillary: 198 mg/dL — ABNORMAL HIGH (ref 70–99)
Glucose-Capillary: 272 mg/dL — ABNORMAL HIGH (ref 70–99)

## 2019-02-21 MED ORDER — FUROSEMIDE 10 MG/ML IJ SOLN
60.0000 mg | Freq: Two times a day (BID) | INTRAMUSCULAR | Status: DC
  Administered 2019-02-21: 60 mg via INTRAVENOUS
  Filled 2019-02-21 (×3): qty 6

## 2019-02-21 MED ORDER — POTASSIUM CHLORIDE CRYS ER 20 MEQ PO TBCR
30.0000 meq | EXTENDED_RELEASE_TABLET | Freq: Two times a day (BID) | ORAL | Status: DC
  Administered 2019-02-21 – 2019-02-23 (×5): 30 meq via ORAL
  Filled 2019-02-21 (×5): qty 1

## 2019-02-21 NOTE — Progress Notes (Signed)
[facility] [place] NAME: [name]    MR#:  [id number]  DATE OF BIRTH:  1956/06/21  SUBJECTIVE:   Denies any worsening shortness of breath cough.  Diuresing well with IV Lasix.  REVIEW OF SYSTEMS:   Review of Systems  Constitutional: Negative for chills, fever and weight loss.  HENT: Negative for ear discharge, ear pain and nosebleeds.   Eyes: Negative for blurred vision, pain and discharge.  Respiratory: Positive for shortness of breath. Negative for sputum production, wheezing and stridor.   Cardiovascular: Positive for leg swelling. Negative for chest pain, palpitations, orthopnea and PND.  Gastrointestinal: Negative for abdominal pain, diarrhea, nausea and vomiting.  Genitourinary: Negative for frequency and urgency.  Musculoskeletal: Negative for back pain and joint pain.  Neurological: Negative for sensory change, speech change, focal weakness and weakness.  Psychiatric/Behavioral: Negative for depression and hallucinations. The patient is not nervous/anxious.    Tolerating Diet: yes Tolerating PT: ambulatory  DRUG ALLERGIES:  No Known Allergies  VITALS:  Blood pressure 113/76, pulse (!) 108, temperature 97.8 F (36.6 C), temperature source Oral, resp. rate 19, height 5\' 8"  (1.727 m), weight (!) 138.4 kg, SpO2 94 %.  PHYSICAL EXAMINATION:   Physical Exam  GENERAL:  63 y.o.-year-old obese patient lying in the bed in no acute distress.  EYES: Pupils equal, round, reactive to light and accommodation. No scleral icterus. Extraocular muscles intact.  HEENT: Head atraumatic, normocephalic. Oropharynx and nasopharynx clear.  NECK:  Supple, + jugular venous distention. No thyroid enlargement, no tenderness.  LUNGS: distant breath sounds bilaterally, no wheezing, rales, rhonchi. No use of accessory muscles of respiration.  CARDIOVASCULAR: S1, S2 normal. No murmurs, rubs, or gallops.  ABDOMEN: Soft, nontender,  nondistended. Bowel sounds present. No organomegaly or mass.  EXTREMITIES: + 2 pitting edema b/l, no cyanosis, clubbing.     NEUROLOGIC: Cranial nerves II through XII are intact. No focal Motor or sensory deficits b/l. Globally weak.    PSYCHIATRIC:  patient is alert and oriented x 3.  SKIN: No obvious rash, lesion, or ulcer.   LABORATORY PANEL:  CBC Recent Labs  Lab 02/20/19 0532  WBC 5.5  HGB 10.1*  HCT 32.5*  PLT 178    Chemistries  Recent Labs  Lab 02/16/19 1115  02/20/19 0532 02/21/19 0424  NA 138   < > 140 138  K 3.8   < > 3.9 3.6  CL 95*   < > 97* 94*  CO2 29   < > 32 32  GLUCOSE 171*   < > 165* 189*  BUN 51*   < > 53* 58*  CREATININE 2.03*   < > 1.68* 1.58*  CALCIUM 9.2   < > 9.4 9.2  MG  --    < > 2.1  --   AST 18  --   --   --   ALT 7  --   --   --   ALKPHOS 66  --   --   --   BILITOT 0.6  --   --   --    < > = values in this interval not displayed.   Cardiac Enzymes Recent Labs  Lab 02/16/19 1115  TROPONINI <0.03   RADIOLOGY:  Dg Chest Port 1 View  Result Date: 02/21/2019 CLINICAL DATA:  Cough. EXAM: PORTABLE CHEST 1 VIEW COMPARISON:  02/17/2019 FINDINGS: Cardiac enlargement and mild pulmonary edema is noted. Aortic atherosclerosis. This is slightly improved from previous exam.  Pleural effusions and loculated fluid along the right minor fissure is noted again. No superimposed airspace consolidation. IMPRESSION: 1. Congestive heart failure. There is been mild improvement in pulmonary edema compared with previous exam. 2.  Aortic Atherosclerosis (ICD10-I70.0). Electronically Signed   By: Kerby Moors M.D.   On: 02/21/2019 13:28   ASSESSMENT AND PLAN:  [name]  is a 63 y.o. female with a known history of chronic systolic congestive heart failure, CAD, morbid obesity comes to the emergency room with increasing shortness of breath and weight gain more than 40 pounds in the last few days.  1. Acute on chronic systolic congestive heart failure -  patient came in with increasing shortness of breath, weight gain more than 40 pounds, elevated BNP, paroxysmal nocturnal dyspnea and hypoxia - Continue diuresis with IV Lasix and patient is responding to it.  Continue metolazone. - pt's weight is down from 145 Kg on admission to 138 Kg now.   -Follow BUN/creatinine and serial electrolytes.  Cont. Diuresis for another day.  -Appreciate cardiology input, continue Toprol. -Repeat chest x-ray today showing improvement in pulmonary edema as compared to previous.  2. Chronic kidney disease stage III -Creatinine close to Normal continue to monitor with IV diuresis.  3. Hypertension - cont. Toprol.   4. Type II diabetes - cont Lantus, SSI. BS stable.   5.  Hyperlipidemia-continue Crestor.  6.  Diabetic neuropathy-continue gabapentin.  7.  PVD-continue Plavix, Crestor.   CODE STATUS: DNR DVT Prophylaxis: heparin  TOTAL TIME TAKING CARE OF THIS PATIENT: *30* minutes.   POSSIBLE D/C IN *1-2* DAYS, DEPENDING ON CLINICAL CONDITION.  Note: This dictation was prepared with Dragon dictation along with smaller phrase technology. Any transcriptional errors that result from this process are unintentional.  [name] M.D on 02/21/2019 at 3:32 PM  Between 7am to 6pm - Pager - [phone]  After 6pm go to [email or web address] - password [email or web address] [facility]  Office  [phone]  CC: Primary care physician; Rutherford Guys, MDPatient ID: [name], female   DOB: 09/20/1956, 63 y.o.   MRN: [id number]

## 2019-02-21 NOTE — Progress Notes (Signed)
Held afternoon dose of lasix d/t low BP, MD aware.

## 2019-02-21 NOTE — Plan of Care (Signed)
Patient weaned to room air, 02 sats above 90%.   OOB to recliner for most of shift, ambulated outside room with 1 assist, tolerated well.   Problem: Education: Goal: Knowledge of General Education information will improve Description Including pain rating scale, medication(s)/side effects and non-pharmacologic comfort measures Outcome: Progressing   Problem: Clinical Measurements: Goal: Ability to maintain clinical measurements within normal limits will improve Outcome: Progressing   Problem: Activity: Goal: Risk for activity intolerance will decrease Outcome: Progressing   Problem: Education: Goal: Ability to demonstrate management of disease process will improve Outcome: Progressing   Problem: Activity: Goal: Capacity to carry out activities will improve Outcome: Progressing   Problem: Cardiac: Goal: Ability to achieve and maintain adequate cardiopulmonary perfusion will improve Outcome: Progressing

## 2019-02-21 NOTE — Progress Notes (Signed)
Progress Note  Patient Name: [name] Date of Encounter: 02/21/2019  Primary Cardiologist: Rockey Situ  Subjective   Documented UOP 1.7 L for the past 24 hours with a net - 11.9 L for the admission. Weight 140 kg-->138.4 kg with a dry weight around 128-130 kg. Renal function improved to 1.58 following holding of PM dose of Lasix on 5/16 secondary to slight bump in renal function on 5/16.   SOB continues to improve. No chest pain. Remains mildly orthopneic. Lower extremity swelling improving.  Inpatient Medications    Scheduled Meds: . aspirin EC  81 mg Oral Daily  . calcium-vitamin D  1 tablet Oral BID  . clopidogrel  75 mg Oral Daily  . ferrous sulfate  325 mg Oral BID WC  . gabapentin  300 mg Oral q morning - 10a  . gabapentin  600 mg Oral QHS  . heparin  5,000 Units Subcutaneous Q8H  . insulin aspart  0-5 Units Subcutaneous QHS  . insulin aspart  0-9 Units Subcutaneous TID WC  . insulin glargine  12 Units Subcutaneous QHS  . metolazone  5 mg Oral Daily  . metoprolol succinate  50 mg Oral Daily  . multivitamin with minerals  1 tablet Oral Daily  . omega-3 acid ethyl esters  1 g Oral Daily  . rosuvastatin  40 mg Oral Daily  . sodium chloride flush  3 mL Intravenous Q12H   Continuous Infusions: . sodium chloride     PRN Meds: sodium chloride, acetaminophen, levalbuterol, ondansetron (ZOFRAN) IV, sodium chloride flush, traMADol   Vital Signs    Vitals:   02/20/19 1741 02/20/19 1949 02/21/19 0602 02/21/19 0831  BP: 101/63 110/68 119/73 107/71  Pulse: 80 96 84 (!) 101  Resp:  18 19   Temp: 98.9 F (37.2 C) 99.5 F (37.5 C) 98.5 F (36.9 C) 97.8 F (36.6 C)  TempSrc: Oral Oral  Oral  SpO2: 98% 99% 97% 94%  Weight:   (!) 138.4 kg   Height:        Intake/Output Summary (Last 24 hours) at 02/21/2019 0919 Last data filed at 02/21/2019 7096 Gross per 24 hour  Intake -  Output 2500 ml  Net -2500 ml   Filed Weights   02/19/19 0428 02/20/19 0523 02/21/19  0602  Weight: (!) 140.6 kg (!) 140.3 kg (!) 138.4 kg    Telemetry    Sinus rhythm to sinus tach, BBB - Personally Reviewed  ECG    n/a - Personally Reviewed  Physical Exam   GEN: No acute distress.   Neck: JVD elevated to the angle of the mandible. Cardiac: RRR, no murmurs, rubs, or gallops.  Respiratory: Bibasilar crackles.  GI: Soft, nontender, non-distended.   MS: Trace bilateral pretibial edema; No deformity. Neuro:  Alert and oriented x 3; Nonfocal.  Psych: Normal affect.  Labs    Chemistry Recent Labs  Lab 02/16/19 1115  02/19/19 0502 02/20/19 0532 02/21/19 0424  NA 138   < > 138 140 138  K 3.8   < > 3.7 3.9 3.6  CL 95*   < > 96* 97* 94*  CO2 29   < > 32 32 32  GLUCOSE 171*   < > 146* 165* 189*  BUN 51*   < > 47* 53* 58*  CREATININE 2.03*   < > 1.62* 1.68* 1.58*  CALCIUM 9.2   < > 9.3 9.4 9.2  PROT 7.7  --   --   --   --  ALBUMIN 4.0  --   --   --   --   AST 18  --   --   --   --   ALT 7  --   --   --   --   ALKPHOS 66  --   --   --   --   BILITOT 0.6  --   --   --   --   GFRNONAA 25*   < > 33* 32* 34*  GFRAA 29*   < > 38* 37* 39*  ANIONGAP 14   < > 10 11 12    < > = values in this interval not displayed.     Hematology Recent Labs  Lab 02/16/19 1115 02/20/19 0532  WBC 6.4 5.5  RBC 4.08 3.68*  HGB 11.3* 10.1*  HCT 36.4 32.5*  MCV 89.2 88.3  MCH 27.7 27.4  MCHC 31.0 31.1  RDW 14.4 14.2  PLT 191 178    Cardiac Enzymes Recent Labs  Lab 02/16/19 1115  TROPONINI <0.03   No results for input(s): TROPIPOC in the last 168 hours.   BNP Recent Labs  Lab 02/16/19 1115  BNP 1,289.0*     DDimer No results for input(s): DDIMER in the last 168 hours.   Radiology    No results found.  Cardiac Studies   LHC 10/05/18  Prox Cx to Dist Cx lesion is 80% stenosed.  Prox LAD-1 lesion is 40% stenosed.  Prox LAD-2 lesion is 90% stenosed.  Prox LAD to Mid LAD lesion is 80% stenosed.  Mid LAD to Dist LAD lesion is 70% stenosed.  1st  Diag lesion is 90% stenosed.  Ost 1st Diag lesion is 80% stenosed.  Mid RCA-1 lesion is 40% stenosed.  Mid RCA-2 lesion is 40% stenosed. Final Conclusions:  Severe disease of LAD and diagonal branch Patent proximal LAD stent with severe stenosis at the distal edge of the stent Remainder of the LAD is also small, diffusely diseased, diagonal branch diffusely diseased -RCA large vessel dominant with mild disease, left circumflex/OM with mild disease proximal region Recommendations:  Case discussed with interventional cardiology, Placing a short stent in the distal edge of the LAD stent may provide little clinical benefit with high risk and may delay surgery on her osteomyelitis/toe infection. Troponin elevation likely secondary to demand ischemia in the setting of febrile, tachycardic, hypertensive on arrival in the setting of bacteremia --Would favor medical management at this time and proceeding with removal of her toe.  _____________  12/04/2018 PROCEDURES PERFORMED Aspiration of mobile vegetation on tricuspid valve and RV lead with  angiovac Central line placement FINDINGS Mobile vegetation on tricuspid valve and RV lead, successfully aspirated. _____________ Limited echo 12/11/2018  Limited study to evaluate valves  Moderately decreased left ventricular systolic function, ejection  fraction 40%  Inferolateral and possible anterolateral wall motion abnormality (see  details below)  Elevated pulmonary artery systolic pressure - mild  Tricuspid regurgitation - mild  Normal right ventricular systolic function  No apparent tricuspid valve or device lead vegetation Globally reduced LV function similar to prior study 12/08/18. Wall motion  abnormalities are better evaluated on that study.   Patient Profile     63 y.o. 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  Assessment & Plan    1. Acute on chronic combined CHF/ICM: -She is minus 1.7 L for the past 24 hours and net - 11.9 L for the admission -Her weight remains elevated at 138.4 kg -Dry weight ~ 128-130 kg -Lasix holiday in the PM of 5/15 secondary to slight bump in SCr -She Remains volume up -Resume IV Lasix at lower dose of 60 mg bid with metolazone  -No ACEi/ARB/ARNI/MRA in the setting of CKD -Toprol XL  2. Atrial tachycardia: -Currently maintaining sinus rhythm -Continue Toprol XL -At discharge, consider Zio patch -She will need follow up with EP  3. CAD involving the native coronary arteries without angina: -She has known severe disease by LHC in 09/2018 with CTO of the LAD that has been medically managed -No chest pain -Continue DAPT, Toprol XL, and Crestor  4. Acute on CKD stage III: -Renal function was elevated at time of admission with a SCr of 2.03 which had improved to 1.62 as of 5/15 -Renal function slightly worse on 5/16 leading to the holding of Lasix in the PM -Improved today -Resume IV Lasix and metolazone as above -Monitor  5. 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 Caryl Comes   For questions or updates, please contact [facility] Please consult [email or web address] for contact info under Cardiology/STEMI.    Signed, Christell Faith, PA-C [facility] Pager: [phone] 02/21/2019, 9:19 AM

## 2019-02-22 LAB — BASIC METABOLIC PANEL
Anion gap: 11 (ref 5–15)
BUN: 64 mg/dL — ABNORMAL HIGH (ref 8–23)
CO2: 29 mmol/L (ref 22–32)
Calcium: 8.9 mg/dL (ref 8.9–10.3)
Chloride: 97 mmol/L — ABNORMAL LOW (ref 98–111)
Creatinine, Ser: 1.77 mg/dL — ABNORMAL HIGH (ref 0.44–1.00)
GFR calc Af Amer: 34 mL/min — ABNORMAL LOW (ref >60)
GFR calc non Af Amer: 30 mL/min — ABNORMAL LOW (ref >60)
Glucose, Bld: 205 mg/dL — ABNORMAL HIGH (ref 70–99)
Potassium: 4 mmol/L (ref 3.5–5.1)
Sodium: 137 mmol/L (ref 135–145)

## 2019-02-22 LAB — GLUCOSE, CAPILLARY
Glucose-Capillary: 176 mg/dL — ABNORMAL HIGH (ref 70–99)
Glucose-Capillary: 258 mg/dL — ABNORMAL HIGH (ref 70–99)
Glucose-Capillary: 272 mg/dL — ABNORMAL HIGH (ref 70–99)
Glucose-Capillary: 257 mg/dL — ABNORMAL HIGH (ref 70–99)

## 2019-02-22 MED ORDER — TORSEMIDE 20 MG PO TABS
80.0000 mg | ORAL_TABLET | Freq: Two times a day (BID) | ORAL | Status: DC
  Administered 2019-02-22 – 2019-02-23 (×3): 80 mg via ORAL
  Filled 2019-02-22 (×3): qty 4

## 2019-02-22 MED ORDER — INSULIN ASPART 100 UNIT/ML ~~LOC~~ SOLN
3.0000 [IU] | Freq: Three times a day (TID) | SUBCUTANEOUS | Status: DC
  Administered 2019-02-22 – 2019-02-23 (×3): 3 [IU] via SUBCUTANEOUS
  Filled 2019-02-22 (×3): qty 1

## 2019-02-22 NOTE — Progress Notes (Signed)
Inpatient Diabetes Program Recommendations  AACE/ADA: New Consensus Statement on Inpatient Glycemic Control   Target Ranges:  Prepandial:   less than 140 mg/dL      Peak postprandial:   less than 180 mg/dL (1-2 hours)      Critically ill patients:  140 - 180 mg/dL   Results for AMIT, MELOY (MRN [id number]) as of 02/22/2019 10:08  Ref. Range 02/21/2019 07:59 02/21/2019 11:24 02/21/2019 16:45 02/21/2019 21:04 02/22/2019 07:54  Glucose-Capillary Latest Ref Range: 70 - 99 mg/dL 166 (H) 201 (H) 198 (H) 272 (H) 176 (H)   Review of Glycemic Control  Current orders for Inpatient glycemic control: Lantus 12 units QHS, Novolog 0-9 units TID with meals, Novolog 0-5 units QHS  Inpatient Diabetes Program Recommendations:  Insulin-Meal Coverage:Post prandial glucose is consistently elevated.  Please consider ordering Novolog 3 units TID with meals for meal coverage if patient eats at least 50% of meals.  Thanks, Barnie Alderman, RN, MSN, CDE Diabetes Coordinator Inpatient Diabetes Program [phone] (Team Pager from 8am to 5pm)

## 2019-02-22 NOTE — Progress Notes (Signed)
Progress Note  Patient Name: [name] Date of Encounter: 02/22/2019  Primary Cardiologist: Rockey Situ  Subjective   Evening dose of furosemide was held yesterday due to low blood pressure.  She is -13 L for the admission and her weight is down 8 kg.  She reports improvement in shortness of breath and leg edema.  Inpatient Medications    Scheduled Meds: . aspirin EC  81 mg Oral Daily  . calcium-vitamin D  1 tablet Oral BID  . clopidogrel  75 mg Oral Daily  . ferrous sulfate  325 mg Oral BID WC  . gabapentin  300 mg Oral q morning - 10a  . gabapentin  600 mg Oral QHS  . heparin  5,000 Units Subcutaneous Q8H  . insulin aspart  0-5 Units Subcutaneous QHS  . insulin aspart  0-9 Units Subcutaneous TID WC  . insulin glargine  12 Units Subcutaneous QHS  . metoprolol succinate  50 mg Oral Daily  . multivitamin with minerals  1 tablet Oral Daily  . omega-3 acid ethyl esters  1 g Oral Daily  . potassium chloride  30 mEq Oral BID  . rosuvastatin  40 mg Oral Daily  . sodium chloride flush  3 mL Intravenous Q12H  . torsemide  80 mg Oral BID   Continuous Infusions: . sodium chloride     PRN Meds: sodium chloride, acetaminophen, levalbuterol, ondansetron (ZOFRAN) IV, sodium chloride flush, traMADol   Vital Signs    Vitals:   02/21/19 1956 02/22/19 0444 02/22/19 0757 02/22/19 0853  BP: 114/72 107/75 130/78 94/64  Pulse: 97 87 88 77  Resp:      Temp: 98.8 F (37.1 C) (!) 97.5 F (36.4 C) (!) 97.5 F (36.4 C)   TempSrc: Oral Oral Oral   SpO2: 96% 99% 99%   Weight:  (!) 136.3 kg    Height:        Intake/Output Summary (Last 24 hours) at 02/22/2019 0918 Last data filed at 02/22/2019 0447 Gross per 24 hour  Intake -  Output 1200 ml  Net -1200 ml   Filed Weights   02/20/19 0523 02/21/19 0602 02/22/19 0444  Weight: (!) 140.3 kg (!) 138.4 kg (!) 136.3 kg    Telemetry    Sinus rhythm, BBB - Personally Reviewed  ECG    n/a - Personally Reviewed  Physical Exam    GEN: No acute distress.   Neck:  Mild JVD Cardiac: RRR, no murmurs, rubs, or gallops.  Respiratory:  Few crackles at the base GI: Soft, nontender, non-distended.   MS: Trace bilateral pretibial edema; No deformity. Neuro:  Alert and oriented x 3; Nonfocal.  Psych: Normal affect.  Labs    Chemistry Recent Labs  Lab 02/16/19 1115  02/20/19 0532 02/21/19 0424 02/22/19 0517  NA 138   < > 140 138 137  K 3.8   < > 3.9 3.6 4.0  CL 95*   < > 97* 94* 97*  CO2 29   < > 32 32 29  GLUCOSE 171*   < > 165* 189* 205*  BUN 51*   < > 53* 58* 64*  CREATININE 2.03*   < > 1.68* 1.58* 1.77*  CALCIUM 9.2   < > 9.4 9.2 8.9  PROT 7.7  --   --   --   --   ALBUMIN 4.0  --   --   --   --   AST 18  --   --   --   --  ALT 7  --   --   --   --   ALKPHOS 66  --   --   --   --   BILITOT 0.6  --   --   --   --   GFRNONAA 25*   < > 32* 34* 30*  GFRAA 29*   < > 37* 39* 34*  ANIONGAP 14   < > 11 12 11    < > = values in this interval not displayed.     Hematology Recent Labs  Lab 02/16/19 1115 02/20/19 0532  WBC 6.4 5.5  RBC 4.08 3.68*  HGB 11.3* 10.1*  HCT 36.4 32.5*  MCV 89.2 88.3  MCH 27.7 27.4  MCHC 31.0 31.1  RDW 14.4 14.2  PLT 191 178    Cardiac Enzymes Recent Labs  Lab 02/16/19 1115  TROPONINI <0.03   No results for input(s): TROPIPOC in the last 168 hours.   BNP Recent Labs  Lab 02/16/19 1115  BNP 1,289.0*     DDimer No results for input(s): DDIMER in the last 168 hours.   Radiology    Dg Chest Port 1 View  Result Date: 02/21/2019 CLINICAL DATA:  Cough. EXAM: PORTABLE CHEST 1 VIEW COMPARISON:  02/17/2019 FINDINGS: Cardiac enlargement and mild pulmonary edema is noted. Aortic atherosclerosis. This is slightly improved from previous exam. Pleural effusions and loculated fluid along the right minor fissure is noted again. No superimposed airspace consolidation. IMPRESSION: 1. Congestive heart failure. There is been mild improvement in pulmonary edema compared with  previous exam. 2.  Aortic Atherosclerosis (ICD10-I70.0). Electronically Signed   By: Kerby Moors M.D.   On: 02/21/2019 13:28    Cardiac Studies   LHC 10/05/18  Prox Cx to Dist Cx lesion is 80% stenosed.  Prox LAD-1 lesion is 40% stenosed.  Prox LAD-2 lesion is 90% stenosed.  Prox LAD to Mid LAD lesion is 80% stenosed.  Mid LAD to Dist LAD lesion is 70% stenosed.  1st Diag lesion is 90% stenosed.  Ost 1st Diag lesion is 80% stenosed.  Mid RCA-1 lesion is 40% stenosed.  Mid RCA-2 lesion is 40% stenosed. Final Conclusions:  Severe disease of LAD and diagonal branch Patent proximal LAD stent with severe stenosis at the distal edge of the stent Remainder of the LAD is also small, diffusely diseased, diagonal branch diffusely diseased -RCA large vessel dominant with mild disease, left circumflex/OM with mild disease proximal region Recommendations:  Case discussed with interventional cardiology, Placing a short stent in the distal edge of the LAD stent may provide little clinical benefit with high risk and may delay surgery on her osteomyelitis/toe infection. Troponin elevation likely secondary to demand ischemia in the setting of febrile, tachycardic, hypertensive on arrival in the setting of bacteremia --Would favor medical management at this time and proceeding with removal of her toe.  _____________  12/04/2018 PROCEDURES PERFORMED Aspiration of mobile vegetation on tricuspid valve and RV lead with  angiovac Central line placement FINDINGS Mobile vegetation on tricuspid valve and RV lead, successfully aspirated. _____________ Limited echo 12/11/2018  Limited study to evaluate valves  Moderately decreased left ventricular systolic function, ejection  fraction 40%  Inferolateral and possible anterolateral wall motion abnormality (see  details below)  Elevated pulmonary artery systolic pressure - mild  Tricuspid regurgitation - mild  Normal right ventricular  systolic function  No apparent tricuspid valve or device lead vegetation Globally reduced LV function similar to prior study 12/08/18. Wall motion  abnormalities are better evaluated  on that study.   Patient Profile     63 y.o. female with history of CAD, left bundle branch block, suspicion for intermittent/underlying atrial tachycardia, mixed ischemic and nonischemic cardiomyopathy with chronic combined heart failure (EF 40% by echo March 2020), type 2 diabetes mellitus, hypertension, obesity, sleep apnea, and MSSA bacteremia status post ICD explantat [facility] 11/2018, who was admitted5/12secondary to worsening heart failure.  Assessment & Plan    1. Acute on chronic combined CHF/ICM: -She is down 8 kg for the admission and -13 L.  She continues to be mildly volume overloaded but diuresis has been limited by hypotension.  We might need to aim for slower pace of diuresis.  I discussed with Dr. Verdell Carmine and will switch to torsemide 80 mg twice daily as she was on as an outpatient.  If she remains stable on this, she can likely be discharged home tomorrow.  She has been getting metolazone 5 mg once daily during this admission but I held that for now.  She can likely be discharged on metolazone 5 mg to be used as needed for weight gain.  2. Atrial tachycardia: -Currently maintaining sinus rhythm -Continue Toprol XL -At discharge, consider Zio patch -She will need follow up with EP  3. CAD involving the native coronary arteries without angina: -She has known severe disease by LHC in 09/2018 with CTO of the LAD that has been medically managed -No chest pain -Continue DAPT, Toprol XL, and Crestor  4. Acute on CKD stage III: -Renal function seems to be stable.  5. 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 Caryl Comes   For questions or updates, please contact [facility] Please consult [email or web address] for contact info under Cardiology/STEMI.    Signed, [name], MD [facility] 02/22/2019, 9:18 AM

## 2019-02-22 NOTE — Progress Notes (Signed)
[place] [facility] [place] NAME: [name]    MR#:  [id number]  DATE OF BIRTH:  08-15-1956  SUBJECTIVE:   Pt. Was hypotensive yesterday afternoon and also this a.m. and so IV lasix was held.  No other complaints presently.    REVIEW OF SYSTEMS:   Review of Systems  Constitutional: Negative for chills, fever and weight loss.  HENT: Negative for ear discharge, ear pain and nosebleeds.   Eyes: Negative for blurred vision, pain and discharge.  Respiratory: Positive for shortness of breath. Negative for sputum production, wheezing and stridor.   Cardiovascular: Positive for leg swelling. Negative for chest pain, palpitations, orthopnea and PND.  Gastrointestinal: Negative for abdominal pain, diarrhea, nausea and vomiting.  Genitourinary: Negative for frequency and urgency.  Musculoskeletal: Negative for back pain and joint pain.  Neurological: Negative for sensory change, speech change, focal weakness and weakness.  Psychiatric/Behavioral: Negative for depression and hallucinations. The patient is not nervous/anxious.    Tolerating Diet: yes Tolerating PT: ambulatory  DRUG ALLERGIES:  No Known Allergies  VITALS:  Blood pressure 94/64, pulse 77, temperature (!) 97.5 F (36.4 C), temperature source Oral, resp. rate 16, height 5\' 8"  (1.727 m), weight (!) 136.3 kg, SpO2 100 %.  PHYSICAL EXAMINATION:   Physical Exam  GENERAL:  63 y.o.-year-old obese patient lying in the bed in no acute distress.  EYES: Pupils equal, round, reactive to light and accommodation. No scleral icterus. Extraocular muscles intact.  HEENT: Head atraumatic, normocephalic. Oropharynx and nasopharynx clear.  NECK:  Supple, + jugular venous distention. No thyroid enlargement, no tenderness.  LUNGS: distant breath sounds bilaterally, no wheezing, rales, rhonchi. No use of accessory muscles of respiration.  CARDIOVASCULAR: S1, S2 normal. No murmurs, rubs, or gallops.   ABDOMEN: Soft, nontender, nondistended. Bowel sounds present. No organomegaly or mass.  EXTREMITIES: + 2 pitting edema b/l, no cyanosis, clubbing.     NEUROLOGIC: Cranial nerves II through XII are intact. No focal Motor or sensory deficits b/l. Globally weak.    PSYCHIATRIC:  patient is alert and oriented x 3.  SKIN: No obvious rash, lesion, or ulcer.   LABORATORY PANEL:  CBC Recent Labs  Lab 02/20/19 0532  WBC 5.5  HGB 10.1*  HCT 32.5*  PLT 178    Chemistries  Recent Labs  Lab 02/16/19 1115  02/20/19 0532  02/22/19 0517  NA 138   < > 140   < > 137  K 3.8   < > 3.9   < > 4.0  CL 95*   < > 97*   < > 97*  CO2 29   < > 32   < > 29  GLUCOSE 171*   < > 165*   < > 205*  BUN 51*   < > 53*   < > 64*  CREATININE 2.03*   < > 1.68*   < > 1.77*  CALCIUM 9.2   < > 9.4   < > 8.9  MG  --    < > 2.1  --   --   AST 18  --   --   --   --   ALT 7  --   --   --   --   ALKPHOS 66  --   --   --   --   BILITOT 0.6  --   --   --   --    < > = values in this interval not  displayed.   Cardiac Enzymes Recent Labs  Lab 02/16/19 1115  TROPONINI <0.03   RADIOLOGY:  Dg Chest Port 1 View  Result Date: 02/21/2019 CLINICAL DATA:  Cough. EXAM: PORTABLE CHEST 1 VIEW COMPARISON:  02/17/2019 FINDINGS: Cardiac enlargement and mild pulmonary edema is noted. Aortic atherosclerosis. This is slightly improved from previous exam. Pleural effusions and loculated fluid along the right minor fissure is noted again. No superimposed airspace consolidation. IMPRESSION: 1. Congestive heart failure. There is been mild improvement in pulmonary edema compared with previous exam. 2.  Aortic Atherosclerosis (ICD10-I70.0). Electronically Signed   By: Kerby Moors M.D.   On: 02/21/2019 13:28   ASSESSMENT AND PLAN:  [name]  is a 63 y.o. female with a known history of chronic systolic congestive heart failure, CAD, morbid obesity comes to the emergency room with increasing shortness of breath and weight gain more  than 40 pounds in the last few days.  1. Acute on chronic systolic congestive heart failure - patient came in with increasing shortness of breath, weight gain more than 40 pounds, elevated BNP, paroxysmal nocturnal dyspnea and hypoxia -Patient was hypotensive yesterday and this morning and therefore taken off IV Lasix and will switch to oral torsemide for now.  Continue metolazone.  Follow hemodynamics. - Continue diuresis with IV Lasix and patient is responding to it.  Continue metolazone. - pt's weight is down from 145 Kg on admission to 136 Kg now.    -Follow BUN/creatinine and serial electrolytes.  -Appreciate cardiology input, continue Toprol. -Repeat chest x-ray today showing improvement in pulmonary edema as compared to previous.  2. Chronic kidney disease stage III -Creatinine close to Normal continue to monitor with IV diuresis.  3. Hypertension - cont. Toprol.   4. Type II diabetes - cont Lantus, SSI. BS stable.   5.  Hyperlipidemia-continue Crestor.  6.  Diabetic neuropathy-continue gabapentin.  7.  PVD-continue Plavix, Crestor.  If patient's hypotension is improved tomorrow on oral diuresis will discharge home.  Discussed with cardiology.   CODE STATUS: DNR DVT Prophylaxis: heparin  TOTAL TIME TAKING CARE OF THIS PATIENT: *30* minutes.   POSSIBLE D/C IN *1-2* DAYS, DEPENDING ON CLINICAL CONDITION.  Note: This dictation was prepared with Dragon dictation along with smaller phrase technology. Any transcriptional errors that result from this process are unintentional.  [name] M.D on 02/22/2019 at 2:01 PM  Between 7am to 6pm - Pager - [phone]  After 6pm go to [email or web address] - password [email or web address] [facility]  Office  [phone]  CC: Primary care physician; Rutherford Guys, MDPatient ID: [name], female   DOB: 1956/04/18, 63 y.o.   MRN: [id number]

## 2019-02-22 NOTE — Care Management Important Message (Signed)
Important Message  Patient Details  Name: [name] MRN: [id number] Date of Birth: Sep 30, 1956   Medicare Important Message Given:  Yes    Elza Rafter, RN 02/22/2019, 10:44 AM

## 2019-02-22 NOTE — Evaluation (Signed)
Physical Therapy Evaluation Patient Details Name: [name] MRN: [id number] DOB: 1956-04-01 Today's Date: 02/22/2019   History of Present Illness  presented to ER secondary to worsening SOB, 20# weight gain, hypoxia; admitted with acute/chronic CHF, pulmonary edema.  Clinical Impression  Upon evaluation, patient alert and oriented; follows commands and demonstrates good insight, effort with all mobility tasks.  Bilat UE/LE strength and ROM grossly symmetrical and WFL; no focal weakness, except L ankle DF 0/5 (baseline foot drop due to neuropathy).  Demonstrates ability to complete bed mobility with mod indep; sit/stand, basic transfers and gait (160') with RW, cga/close sup.  Slow, but steady cadence (10' walk time, 10 seconds; indicative of household ambulator) without buckling or LOB.  Sats >95% on RA throughout, but does require single standing rest break to complete distance (BORG 8/10).  Do encourage use of RW for all mobility at this time (for optimal safety and energy conservation); patient in agreement and has RW available for use at home. Would benefit from skilled PT to address above deficits and promote optimal return to PLOF; Recommend transition to HHPT upon discharge from acute hospitalization.    Follow Up Recommendations Home health PT    Equipment Recommendations       Recommendations for Other Services       Precautions / Restrictions Precautions Precautions: Fall Restrictions Weight Bearing Restrictions: No      Mobility  Bed Mobility Overal bed mobility: Modified Independent                Transfers Overall transfer level: Needs assistance Equipment used: Rolling walker (2 wheeled) Transfers: Sit to/from Stand Sit to Stand: Min guard;Supervision         General transfer comment: increased time to complete, but able to rise without physical assist from therapist  Ambulation/Gait Ambulation/Gait assistance: Min guard Gait Distance (Feet):  160 Feet Assistive device: Rolling walker (2 wheeled)   Gait velocity: 10' walk time, 10 seconds Gait velocity interpretation: <1.31 ft/sec, indicative of household ambulator General Gait Details: reciprocal stepping pattern with decreased L foot clearance (baseline for patient; has brace at home); slow, but steady, cadence without buckling or LOB. MIld SOB with exertion, sats >95% on RA throughout.  Single standing rest period to complete distance; patient able to self-initiate.  Stairs            Wheelchair Mobility    Modified Rankin (Stroke Patients Only)       Balance Overall balance assessment: Needs assistance Sitting-balance support: No upper extremity supported;Feet supported Sitting balance-Leahy Scale: Good     Standing balance support: Bilateral upper extremity supported Standing balance-Leahy Scale: Fair                               Pertinent Vitals/Pain Pain Assessment: No/denies pain    Home Living Family/patient expects to be discharged to:: Private residence Living Arrangements: Spouse/significant other;Children;Other relatives Available Help at Discharge: Available 24 hours/day;Family Type of Home: House Home Access: Stairs to enter Entrance Stairs-Rails: None Entrance Stairs-Number of Steps: 1 Home Layout: Two level;Able to live on main level with bedroom/bathroom Home Equipment: Dan Humphreys - 2 wheels;Bedside commode;Shower seat;Cane - single point;Wheelchair - manual      Prior Function Level of Independence: Independent         Comments: Indep for ADLs, household and limited community mobility without assist device; no home O2; denies fall history. Husband/family involved and very supportive.  Hand Dominance        Extremity/Trunk Assessment   Upper Extremity Assessment Upper Extremity Assessment: Overall WFL for tasks assessed    Lower Extremity Assessment Lower Extremity Assessment: (grossly 4-/5 throughout, except  0/5 L ankle DF (chronic foot drop due to neuropathy))       Communication   Communication: No difficulties  Cognition Arousal/Alertness: Awake/alert Behavior During Therapy: WFL for tasks assessed/performed Overall Cognitive Status: Within Functional Limits for tasks assessed                                        General Comments      Exercises Other Exercises Other Exercises: Educated on role of activity pacing, energy conservation, graded activity progression; patient voiced understanding of information. Other Exercises: Do encourage use of RW for all mobility at this time (for optimal safety and energy conservation); patient in agreement and has RW available for use at home.   Assessment/Plan    PT Assessment Patient needs continued PT services  PT Problem List Decreased strength;Decreased activity tolerance;Decreased mobility;Decreased balance;Decreased cognition;Decreased knowledge of use of DME;Decreased safety awareness;Cardiopulmonary status limiting activity       PT Treatment Interventions DME instruction;Gait training;Stair training;Functional mobility training;Therapeutic activities;Therapeutic exercise;Balance training;Patient/family education    PT Goals (Current goals can be found in the Care Plan section)  Acute Rehab PT Goals Patient Stated Goal: to return home PT Goal Formulation: With patient Time For Goal Achievement: 03/08/19 Potential to Achieve Goals: Good    Frequency Min 2X/week   Barriers to discharge        Co-evaluation               AM-PAC PT "6 Clicks" Mobility  Outcome Measure Help needed turning from your back to your side while in a flat bed without using bedrails?: None Help needed moving from lying on your back to sitting on the side of a flat bed without using bedrails?: None Help needed moving to and from a bed to a chair (including a wheelchair)?: None Help needed standing up from a chair using your arms  (e.g., wheelchair or bedside chair)?: A Little Help needed to walk in hospital room?: A Little Help needed climbing 3-5 steps with a railing? : A Little 6 Click Score: 21    End of Session Equipment Utilized During Treatment: Gait belt Activity Tolerance: Patient tolerated treatment well Patient left: in chair;with call bell/phone within reach;with chair alarm set Nurse Communication: Mobility status PT Visit Diagnosis: Muscle weakness (generalized) (M62.81);Difficulty in walking, not elsewhere classified (R26.2)    Time: 1610-9604 PT Time Calculation (min) (ACUTE ONLY): 17 min   Charges:   PT Evaluation $PT Eval Moderate Complexity: 1 Mod PT Treatments $Therapeutic Activity: 8-22 mins        Deandre Stansel H. Manson Passey, PT, DPT, NCS 02/22/19, 11:47 AM [phone]

## 2019-02-23 ENCOUNTER — Telehealth: Payer: Self-pay | Admitting: *Deleted

## 2019-02-23 DIAGNOSIS — I4719 Other supraventricular tachycardia: Secondary | ICD-10-CM

## 2019-02-23 DIAGNOSIS — I471 Supraventricular tachycardia: Secondary | ICD-10-CM

## 2019-02-23 DIAGNOSIS — N179 Acute kidney failure, unspecified: Secondary | ICD-10-CM

## 2019-02-23 DIAGNOSIS — N189 Chronic kidney disease, unspecified: Secondary | ICD-10-CM

## 2019-02-23 LAB — CBC
HCT: 33.1 % — ABNORMAL LOW (ref 36.0–46.0)
Hemoglobin: 10.3 g/dL — ABNORMAL LOW (ref 12.0–15.0)
MCH: 27.1 pg (ref 26.0–34.0)
MCHC: 31.1 g/dL (ref 30.0–36.0)
MCV: 87.1 fL (ref 80.0–100.0)
Platelets: 175 10*3/uL (ref 150–400)
RBC: 3.8 MIL/uL — ABNORMAL LOW (ref 3.87–5.11)
RDW: 14.2 % (ref 11.5–15.5)
WBC: 5.6 10*3/uL (ref 4.0–10.5)
nRBC: 0 % (ref 0.0–0.2)

## 2019-02-23 LAB — BASIC METABOLIC PANEL
Anion gap: 10 (ref 5–15)
BUN: 70 mg/dL — ABNORMAL HIGH (ref 8–23)
CO2: 30 mmol/L (ref 22–32)
Calcium: 9.1 mg/dL (ref 8.9–10.3)
Chloride: 97 mmol/L — ABNORMAL LOW (ref 98–111)
Creatinine, Ser: 1.79 mg/dL — ABNORMAL HIGH (ref 0.44–1.00)
GFR calc Af Amer: 34 mL/min — ABNORMAL LOW (ref >60)
GFR calc non Af Amer: 29 mL/min — ABNORMAL LOW (ref >60)
Glucose, Bld: 154 mg/dL — ABNORMAL HIGH (ref 70–99)
Potassium: 4.3 mmol/L (ref 3.5–5.1)
Sodium: 137 mmol/L (ref 135–145)

## 2019-02-23 LAB — GLUCOSE, CAPILLARY
Glucose-Capillary: 166 mg/dL — ABNORMAL HIGH (ref 70–99)
Glucose-Capillary: 166 mg/dL — ABNORMAL HIGH (ref 70–99)

## 2019-02-23 MED ORDER — METOPROLOL SUCCINATE ER 50 MG PO TB24: 50.0000 mg | ORAL_TABLET | Freq: Every day | ORAL | 1 refills | Status: DC

## 2019-02-23 NOTE — Telephone Encounter (Signed)
-----   Message from Nelva Bush, MD sent at 02/23/2019 11:00 AM EDT ----- Regarding: Post-hospital f/u Hello,  I would appreciate your help with the following requests for Ms. Holaway.  I anticipate d/c later today:  14-day Ziopatch (not continuously monitored) for atrial tachycardia.  If can be placed today, great, but okay to have it mailed to the patient.  TCM f/u with Dr. Rockey Situ or APP in 1 week (ideally in person but can do virtual if no clinic spots are open).  EP f/u with Dr. Caryl Comes in ~6 weeks.  Thanks.  Gerald Stabs

## 2019-02-23 NOTE — Progress Notes (Signed)
Progress Note  Patient Name: [name] Date of Encounter: 02/23/2019  Primary Cardiologist: Ida Rogue, MD   Subjective   Breathing and leg swelling improved, almost near baseline.  Patient feels like heart rate is slower than normal.  No chest pain or lightheadedness.  She was able to walk some yesterday without much difficulty.  Inpatient Medications    Scheduled Meds: . aspirin EC  81 mg Oral Daily  . calcium-vitamin D  1 tablet Oral BID  . clopidogrel  75 mg Oral Daily  . ferrous sulfate  325 mg Oral BID WC  . gabapentin  300 mg Oral q morning - 10a  . gabapentin  600 mg Oral QHS  . heparin  5,000 Units Subcutaneous Q8H  . insulin aspart  0-5 Units Subcutaneous QHS  . insulin aspart  0-9 Units Subcutaneous TID WC  . insulin aspart  3 Units Subcutaneous TID WC  . insulin glargine  12 Units Subcutaneous QHS  . metoprolol succinate  50 mg Oral Daily  . multivitamin with minerals  1 tablet Oral Daily  . omega-3 acid ethyl esters  1 g Oral Daily  . potassium chloride  30 mEq Oral BID  . rosuvastatin  40 mg Oral Daily  . sodium chloride flush  3 mL Intravenous Q12H  . torsemide  80 mg Oral BID   Continuous Infusions: . sodium chloride     PRN Meds: sodium chloride, acetaminophen, levalbuterol, ondansetron (ZOFRAN) IV, sodium chloride flush, traMADol   Vital Signs    Vitals:   02/22/19 1949 02/23/19 0000 02/23/19 0427 02/23/19 0806  BP: 109/75  111/72 (!) 98/59  Pulse: 92 78 (!) 106 74  Resp: 18 18 18    Temp: 98.7 F (37.1 C)  98.1 F (36.7 C) 97.8 F (36.6 C)  TempSrc: Oral  Oral Oral  SpO2: 95% 97% 94% 94%  Weight:   135.4 kg   Height:        Intake/Output Summary (Last 24 hours) at 02/23/2019 1003 Last data filed at 02/23/2019 0434 Gross per 24 hour  Intake 720 ml  Output 950 ml  Net -230 ml   Last 3 Weights 02/23/2019 02/22/2019 02/21/2019  Weight (lbs) 298 lb 9.6 oz 300 lb 6.4 oz 305 lb 1.9 oz  Weight (kg) 135.444 kg 136.261 kg 138.4 kg   Some encounter information is confidential and restricted. Go to Review Flowsheets activity to see all data.      Telemetry    Currently sinus rhythm, though episodes of atrial tachycardia versus atypical flutter with variable conduction noted) ventricular rate up to 110 bpm).- Personally Reviewed  ECG    No new tracing.  Physical Exam   GEN: No acute distress.   Neck:  JVP difficult to assess but likely mildly elevated with positive HJR. Cardiac:  Regular rate and rhythm with 2/6 holosystolic murmur loudest at the left lower sternal border.  No rubs or gallops. Respiratory:  Normal work of breathing.  Mildly diminished breath sounds at both lung bases.  No wheezes or crackles. GI: Soft, nontender, non-distended  MS:  Trace pretibial edema bilaterally; No deformity. Neuro:  Nonfocal  Psych: Normal affect   Labs    Chemistry Recent Labs  Lab 02/16/19 1115  02/21/19 0424 02/22/19 0517 02/23/19 0531  NA 138   < > 138 137 137  K 3.8   < > 3.6 4.0 4.3  CL 95*   < > 94* 97* 97*  CO2 29   < >  32 29 30  GLUCOSE 171*   < > 189* 205* 154*  BUN 51*   < > 58* 64* 70*  CREATININE 2.03*   < > 1.58* 1.77* 1.79*  CALCIUM 9.2   < > 9.2 8.9 9.1  PROT 7.7  --   --   --   --   ALBUMIN 4.0  --   --   --   --   AST 18  --   --   --   --   ALT 7  --   --   --   --   ALKPHOS 66  --   --   --   --   BILITOT 0.6  --   --   --   --   GFRNONAA 25*   < > 34* 30* 29*  GFRAA 29*   < > 39* 34* 34*  ANIONGAP 14   < > 12 11 10    < > = values in this interval not displayed.     Hematology Recent Labs  Lab 02/16/19 1115 02/20/19 0532 02/23/19 0531  WBC 6.4 5.5 5.6  RBC 4.08 3.68* 3.80*  HGB 11.3* 10.1* 10.3*  HCT 36.4 32.5* 33.1*  MCV 89.2 88.3 87.1  MCH 27.7 27.4 27.1  MCHC 31.0 31.1 31.1  RDW 14.4 14.2 14.2  PLT 191 178 175    Cardiac Enzymes Recent Labs  Lab 02/16/19 1115  TROPONINI <0.03   No results for input(s): TROPIPOC in the last 168 hours.   BNP Recent Labs  Lab  02/16/19 1115  BNP 1,289.0*     DDimer No results for input(s): DDIMER in the last 168 hours.   Radiology    Dg Chest Port 1 View  Result Date: 02/21/2019 CLINICAL DATA:  Cough. EXAM: PORTABLE CHEST 1 VIEW COMPARISON:  02/17/2019 FINDINGS: Cardiac enlargement and mild pulmonary edema is noted. Aortic atherosclerosis. This is slightly improved from previous exam. Pleural effusions and loculated fluid along the right minor fissure is noted again. No superimposed airspace consolidation. IMPRESSION: 1. Congestive heart failure. There is been mild improvement in pulmonary edema compared with previous exam. 2.  Aortic Atherosclerosis (ICD10-I70.0). Electronically Signed   By: Kerby Moors M.D.   On: 02/21/2019 13:28    Cardiac Studies   LHC 10/05/18  Prox Cx to Dist Cx lesion is 80% stenosed.  Prox LAD-1 lesion is 40% stenosed.  Prox LAD-2 lesion is 90% stenosed.  Prox LAD to Mid LAD lesion is 80% stenosed.  Mid LAD to Dist LAD lesion is 70% stenosed.  1st Diag lesion is 90% stenosed.  Ost 1st Diag lesion is 80% stenosed.  Mid RCA-1 lesion is 40% stenosed.  Mid RCA-2 lesion is 40% stenosed. Final Conclusions:  Severe disease of LAD and diagonal branch Patent proximal LAD stent with severe stenosis at the distal edge of the stent Remainder of the LAD is also small, diffusely diseased, diagonal branch diffusely diseased -RCA large vessel dominant with mild disease, left circumflex/OM with mild disease proximal region Recommendations:  Case discussed with interventional cardiology, Placing a short stent in the distal edge of the LAD stent may provide little clinical benefit with high risk and may delay surgery on her osteomyelitis/toe infection. Troponin elevation likely secondary to demand ischemia in the setting of febrile, tachycardic, hypertensive on arrival in the setting of bacteremia --Would favor medical management at this time and proceeding with removal of her toe.   _____________  12/04/2018 PROCEDURES PERFORMED Aspiration of mobile vegetation  on tricuspid valve and RV lead with  angiovac Central line placement FINDINGS Mobile vegetation on tricuspid valve and RV lead, successfully aspirated. _____________ Limited echo 12/11/2018  Limited study to evaluate valves  Moderately decreased left ventricular systolic function, ejection  fraction 40%  Inferolateral and possible anterolateral wall motion abnormality (see  details below)  Elevated pulmonary artery systolic pressure - mild  Tricuspid regurgitation - mild  Normal right ventricular systolic function  No apparent tricuspid valve or device lead vegetation Globally reduced LV function similar to prior study 12/08/18. Wall motion  abnormalities are better evaluated on that study.   Patient Profile     63 y.o. 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  Assessment & Plan    Acute on chronic systolic and diastolic heart failure: Patient continues to diurese, on torsemide 80 mg twice daily.  I's and O's were not well recorded yesterday, but additional 2 pound weight loss over the last day is encouraging.  Ms. Nary likely still has mild residual edema, but I think it is reasonable to consider discharge home and continued outpatient diuresis.  Continue torsemide 80 mg p.o. twice daily.  Ms. Vargo can use metolazone 5 mg daily as needed for weight gain and or worsening edema.  Patient will need close outpatient follow-up with a BMP in 1 week.  Patient not on ACE inhibitor/ARB or aldosterone antagonist in the setting of acute on  chronic kidney injury.  Atrial tachycardia: Transient arrhythmias most consistent with atrial tachycardia versus atypical flutter again noted.  Currently, Ms. Demby is in normal sinus rhythm.  Ventricular rate adequately controlled over the last 24 hours with metoprolol succinate.  Continue metoprolol succinate 50 mg daily; this is less prone to hypotension compared with carvedilol.  We will arrange for 14-day event monitor following discharge.  This will be mailed to the patient after leaving the hospital.  Follow-up with electrophysiology in approximately 6 weeks to review results of monitor and discuss need for antiarrhythmic therapy.  Coronary artery disease: No angina reported during this hospitalization.  Continue secondary prevention with aspirin, clopidogrel, metoprolol succinate, and rosuvastatin.  Acute on chronic kidney disease: Creatinine stable since yesterday, near baseline.  Continue torsemide, as above.  Avoid nephrotoxic drugs.  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  Disposition: Ms. Corlis Leak is stable for discharge today.  She will need close outpatient follow-up with Dr. Rockey Situ or an APP in approximately 1 week and EP in approximately 6 weeks.  For questions or updates, please contact [place] Please consult [email or web address] for contact info under [facility].   Signed, Nelva Bush, MD  02/23/2019, 10:03 AM

## 2019-02-23 NOTE — Discharge Summary (Signed)
[name] at [place] NAME: [name]    MR#:  [id number]  DATE OF BIRTH:  03-25-1956  DATE OF ADMISSION:  02/16/2019 ADMITTING PHYSICIAN: Fritzi Mandes, MD  DATE OF DISCHARGE: 02/23/2019 12:42 PM  PRIMARY CARE PHYSICIAN: Rutherford Guys, MD    ADMISSION DIAGNOSIS:  SOB (shortness of breath) [R06.02] Hypoxia [R09.02] Congestive heart failure, unspecified HF chronicity, unspecified heart failure type ([place]) [I50.9]  DISCHARGE DIAGNOSIS:  Active Problems:   Coronary artery disease involving native coronary artery of native heart without angina pectoris   Acute on chronic combined systolic and diastolic CHF (congestive heart failure) (HCC)   AKI (acute kidney injury) ([place])   Atrial tachycardia ([place])   Acute kidney injury superimposed on chronic kidney disease ([place])   SECONDARY DIAGNOSIS:   Past Medical History:  Diagnosis Date  . Acute myocardial infarction, subendocardial infarction, initial episode of care ([place]) 07/21/2012  . Acute osteomyelitis involving ankle and foot ([place]) 02/06/2015  . Acute systolic heart failure ([place]) 07/21/2012   New onset 07/19/12; admission to [facility] ED. Elevated Troponins.  S/p 2D-echo with EF 20-25%.  S/p cardiac catheterization with stenting LAD.  Repeat 2D-echo 10/2011 with improved EF of 35%.   . Anemia   . Automatic implantable cardioverter-defibrillator in situ    a. MDT CRT-D 06/2014, SN: [id number]  .removed in feb 2020  . CAD (coronary artery disease)    a. cardiac cath 101/04/2012: PCI/DES to chronically occluded mLAD, consideration PCI to diag branch in 4 weeks.   . Cataract   . Chicken pox   . Chronic kidney disease   . Chronic systolic CHF (congestive heart failure) (HCC)    a. mixed ICM & NICM; b. EF 20-25% by echo 07/2012, mid-dist 2/3 of LV sev HK/AK, mild MR. echo 10/2012: EF 30-35%, sev HK ant-septal & inf walls, GR1DD, mild MR, PASP 33. c. echo 02/2013: EF 30%, GR1DD, mild MR. echo 04/2014:  EF 30%, Septal-lat dyssynchrony, global HK, inf AK, GR1DD, mild MR. d. echo 10/2014: EF50-55%, WM nl, GR1DD, septal mild paradox. e. echo 02/2015: EF 50-55%, wm   . Depression   . Heart attack ([place])   . Heel ulcer ([place]) 04/27/2015  . History of blood transfusion ~ 2011   "plasma; had neuropathy; couldn't walk"  . Hypertension   . LBBB (left bundle branch block)   . Neuromuscular disorder ([place])   . Neuropathy 2011  . Obesity, unspecified   . OSA on CPAP    Moderate with AHI 23/hr and now on CPAP at 16cm H2O.  Her DME is [facility]  . Pure hypercholesterolemia   . Sepsis ([place]) 09/2018  . Type II diabetes mellitus ([place])   . Unspecified vitamin D deficiency     HOSPITAL COURSE:   [name], CAD, morbid obesity comes to the emergency room with increasing shortness of breath and weight gain more than 40 pounds in the last few days.  1.Acute on chronic systolic congestive heart failure - patient came in with increasing shortness of breath, weight gain more than 40 pounds, elevated BNP, paroxysmal nocturnal dyspnea and hypoxia Patient was diuresed with IV Lasix and responded well to it.  Her weight came down from 140 kg's to 136 kg's while in the hospital. -She clinically feels better.  She denies any worsening shortness of breath or hypoxemia on exertion. -She was switched from IV Lasix to her home dose torsemide which  she will be discharged home with outpatient follow-up with cardiology. -Her renal function remained stable.  Patient will continue the Toprol as mentioned below.  2.Chronic kidney disease stage III -Patient's creatinine remained stable with IV diuresis and can be further followed as an outpatient.  3.Hypertension - she will cont. Toprol.   4.Type II diabetes -on the hospital patient was on Lantus and sliding scale insulin.  She will resume her meds as stated below along with  glimepiride and her Lantus.  5.  Hyperlipidemia- she will continue Crestor.  6.  Diabetic neuropathy- she will continue gabapentin.  7.  PVD- she will continue Plavix, Crestor.  DISCHARGE CONDITIONS:   Stable.   CONSULTS OBTAINED:  Treatment Team:  Nelva Bush, MD [name], MD  DRUG ALLERGIES:  No Known Allergies  DISCHARGE MEDICATIONS:   Allergies as of 02/23/2019   No Known Allergies     Medication List    STOP taking these medications   carvedilol 6.25 MG tablet Commonly known as:  COREG     TAKE these medications   aspirin 81 MG EC tablet Take 1 tablet (81 mg total) by mouth daily.   Calcium 600-D 600-400 MG-UNIT Tabs Generic drug:  Calcium Carbonate-Vitamin D3 Take 1 tablet by mouth 2 (two) times a day.   clopidogrel 75 MG tablet Commonly known as:  PLAVIX Take 1 tablet (75 mg total) by mouth daily.   ferrous sulfate 325 (65 FE) MG tablet Take 325 mg by mouth 2 (two) times daily with a meal.   Fish Oil 1000 MG Caps Take 1 capsule by mouth 2 (two) times a day.   gabapentin 600 MG tablet Commonly known as:  NEURONTIN Take 1 tablet (600 mg total) by mouth 2 (two) times daily. Takes 1 in the morning and two at bedtime What changed:  additional instructions   glimepiride 4 MG tablet Commonly known as:  AMARYL Take 4 mg by mouth 2 (two) times daily.   glucose blood test strip Commonly known as:  ONE TOUCH ULTRA TEST USE AS DIRECTED TO CHECK BLOOD SUGAR 3 TIMES DAILY   insulin glargine 100 UNIT/ML injection Commonly known as:  LANTUS Inject 0.16 mLs (16 Units total) into the skin at bedtime. What changed:  how much to take   metoprolol succinate 50 MG 24 hr tablet Commonly known as:  TOPROL-XL Take 1 tablet (50 mg total) by mouth daily. Take with or immediately following a meal. Start taking on:  Feb 24, 2019   multivitamin tablet Take 1 tablet by mouth daily.   potassium chloride 10 MEQ tablet Commonly known as:  K-DUR Take  1 tablet (10 mEq total) by mouth as needed. On days you take the 3rd torsemide dose What changed:    when to take this  additional instructions   rosuvastatin 40 MG tablet Commonly known as:  CRESTOR TAKE 1 TABLET BY MOUTH ONCE DAILY   torsemide 20 MG tablet Commonly known as:  DEMADEX Take 4 tablets (80 mg total) by mouth 2 (two) times daily.   traMADol 50 MG tablet Commonly known as:  ULTRAM Take 1 tablet (50 mg total) by mouth every 6 (six) hours as needed.         DISCHARGE INSTRUCTIONS:   DIET:  Cardiac diet and Diabetic diet  DISCHARGE CONDITION:  Stable  ACTIVITY:  Activity as tolerated  OXYGEN:  Home Oxygen: No.   Oxygen Delivery: room air  DISCHARGE LOCATION:  Home with home health physical therapy and  RN  If you experience worsening of your admission symptoms, develop shortness of breath, life threatening emergency, suicidal or homicidal thoughts you must seek medical attention immediately by calling 911 or calling your MD immediately  if symptoms less severe.  You Must read complete instructions/literature along with all the possible adverse reactions/side effects for all the Medicines you take and that have been prescribed to you. Take any new Medicines after you have completely understood and accpet all the possible adverse reactions/side effects.   Please note  You were cared for by a hospitalist during your hospital stay. If you have any questions about your discharge medications or the care you received while you were in the hospital after you are discharged, you can call the unit and asked to speak with the hospitalist on call if the hospitalist that took care of you is not available. Once you are discharged, your primary care physician will handle any further medical issues. Please note that NO REFILLS for any discharge medications will be authorized once you are discharged, as it is imperative that you return to your primary care physician (or  establish a relationship with a primary care physician if you do not have one) for your aftercare needs so that they can reassess your need for medications and monitor your lab values.     Today   No acute events overnight.  Shortness of breath improved.  Patient denies any chest pains or any other associated symptoms.  Will discharge home with home health services today.  VITAL SIGNS:  Blood pressure (!) 98/59, pulse 74, temperature 97.8 F (36.6 C), temperature source Oral, resp. rate 18, height 5\' 8"  (1.727 m), weight 135.4 kg, SpO2 94 %.  I/O:    Intake/Output Summary (Last 24 hours) at 02/23/2019 1501 Last data filed at 02/23/2019 1152 Gross per 24 hour  Intake 720 ml  Output 1450 ml  Net -730 ml    PHYSICAL EXAMINATION:   GENERAL:  63 y.o.-year-old obese patient lying in the bed in no acute distress.  EYES: Pupils equal, round, reactive to light and accommodation. No scleral icterus. Extraocular muscles intact.  HEENT: Head atraumatic, normocephalic. Oropharynx and nasopharynx clear.  NECK:  Supple, + jugular venous distention. No thyroid enlargement, no tenderness.  LUNGS: distant breath sounds bilaterally, no wheezing, rales, rhonchi. No use of accessory muscles of respiration.  CARDIOVASCULAR: S1, S2 normal. No murmurs, rubs, or gallops.  ABDOMEN: Soft, nontender, nondistended. Bowel sounds present. No organomegaly or mass.  EXTREMITIES: + 1 pitting edema b/l, no cyanosis, clubbing.     NEUROLOGIC: Cranial nerves II through XII are intact. No focal Motor or sensory deficits b/l. Globally weak.    PSYCHIATRIC:  patient is alert and oriented x 3.  SKIN: No obvious rash, lesion, or ulcer.   DATA REVIEW:   CBC Recent Labs  Lab 02/23/19 0531  WBC 5.6  HGB 10.3*  HCT 33.1*  PLT 175    Chemistries  Recent Labs  Lab 02/20/19 0532  02/23/19 0531  NA 140   < > 137  K 3.9   < > 4.3  CL 97*   < > 97*  CO2 32   < > 30  GLUCOSE 165*   < > 154*  BUN 53*   < > 70*   CREATININE 1.68*   < > 1.79*  CALCIUM 9.4   < > 9.1  MG 2.1  --   --    < > = values in this interval not  displayed.    Cardiac Enzymes No results for input(s): TROPONINI in the last 168 hours.  Microbiology Results  Results for orders placed or performed during the hospital encounter of 02/16/19  SARS Coronavirus 2 (CEPHEID - Performed in [place] hospital lab), Hosp Order     Status: None   Collection Time: 02/16/19 11:53 AM  Result Value Ref Range Status   SARS Coronavirus 2 NEGATIVE NEGATIVE Final    Comment: (NOTE) If result is NEGATIVE SARS-CoV-2 target nucleic acids are NOT DETECTED. The SARS-CoV-2 RNA is generally detectable in upper and lower  respiratory specimens during the acute phase of infection. The lowest  concentration of SARS-CoV-2 viral copies this assay can detect is 250  copies / mL. A negative result does not preclude SARS-CoV-2 infection  and should not be used as the sole basis for treatment or other  patient management decisions.  A negative result may occur with  improper specimen collection / handling, submission of specimen other  than nasopharyngeal swab, presence of viral mutation(s) within the  areas targeted by this assay, and inadequate number of viral copies  (<250 copies / mL). A negative result must be combined with clinical  observations, patient history, and epidemiological information. If result is POSITIVE SARS-CoV-2 target nucleic acids are DETECTED. The SARS-CoV-2 RNA is generally detectable in upper and lower  respiratory specimens dur ing the acute phase of infection.  Positive  results are indicative of active infection with SARS-CoV-2.  Clinical  correlation with patient history and other diagnostic information is  necessary to determine patient infection status.  Positive results do  not rule out bacterial infection or co-infection with other viruses. If result is PRESUMPTIVE POSTIVE SARS-CoV-2 nucleic acids MAY BE PRESENT.    A presumptive positive result was obtained on the submitted specimen  and confirmed on repeat testing.  While 2019 novel coronavirus  (SARS-CoV-2) nucleic acids may be present in the submitted sample  additional confirmatory testing may be necessary for epidemiological  and / or clinical management purposes  to differentiate between  SARS-CoV-2 and other Sarbecovirus currently known to infect humans.  If clinically indicated additional testing with an alternate test  methodology [id number]) is advised. The SARS-CoV-2 RNA is generally  detectable in upper and lower respiratory sp ecimens during the acute  phase of infection. The expected result is Negative. Fact Sheet for Patients:  StrictlyIdeas.no Fact Sheet for Healthcare Providers: BankingDealers.co.za This test is not yet approved or cleared by the [place] FDA and has been authorized for detection and/or diagnosis of SARS-CoV-2 by FDA under an Emergency Use Authorization (EUA).  This EUA will remain in effect (meaning this test can be used) for the duration of the COVID-19 declaration under Section 564(b)(1) of the Act, 21 U.S.C. section 360bbb-3(b)(1), unless the authorization is terminated or revoked sooner. Performed at [facility], [address], [place], [place] [address]     RADIOLOGY:  No results found.    Management plans discussed with the patient, family and they are in agreement.  CODE STATUS:     Code Status Orders  (From admission, onward)         Start     Ordered   02/16/19 1544  Do not attempt resuscitation (DNR)  Continuous    Question Answer Comment  In the event of cardiac or respiratory ARREST Do not call a "code blue"   In the event of cardiac or respiratory ARREST Do not perform Intubation, CPR, defibrillation or ACLS  In the event of cardiac or respiratory ARREST Use medication by any route, position, wound care, and other  measures to relive pain and suffering. May use oxygen, suction and manual treatment of airway obstruction as needed for comfort.      02/16/19 1543       TOTAL TIME TAKING CARE OF THIS PATIENT: 40 minutes.    [name] M.D on 02/23/2019 at 3:01 PM  Between 7am to 6pm - Pager - [phone]  After 6pm go to [email or web address] - Proofreader  [facility]  Office  [phone]  CC: Primary care physician; Rutherford Guys, MD

## 2019-02-23 NOTE — TOC Transition Note (Signed)
Transition of Care [facility]) - CM/SW Discharge Note   Patient Details  Name: MADDYX VALLIE MRN: [id number] Date of Birth: 07/25/1956  Transition of Care [facility]) CM/SW Contact:  Elza Rafter, RN Phone Number: 02/23/2019, 11:11 AM   Clinical Narrative:   Discharging to home today.  Notified Corene Cornea with [place]. Family to transport home.  No further needs identified.     Final next level of care: [place] Barriers to Discharge: No Barriers Identified   Patient Goals and CMS Choice Patient states their goals for this hospitalization and ongoing recovery are:: return home with [facility] CMS Medicare.gov Compare Post Acute Care list provided to:: Patient Choice offered to / list presented to : Patient  Discharge Placement                       Discharge Plan and Services   Discharge Planning Services: CM Consult, HF Clinic                      HH Arranged: RN [facility] Agency: [place] ([place]) Date [place]: 02/18/19 Time [place]: 5320 Representative spoke with at [place]: [place] ([place]) Interventions     Readmission Risk Interventions Readmission Risk Prevention Plan 02/18/2019  Transportation Screening Complete  Medication Review Press photographer) Complete  PCP or Specialist appointment within 3-5 days of discharge Complete  HRI or [place] Complete  SW Recovery Care/Counseling Consult Patient refused  Palliative Care Screening Not [place] Not Applicable  Some recent data might be hidden

## 2019-02-23 NOTE — Progress Notes (Signed)
Patient discharged home as ordered,instructions explained and well understood,follow up appointments made. Escorted by staff members via wheel chair

## 2019-02-24 ENCOUNTER — Encounter: Payer: Self-pay | Admitting: *Deleted

## 2019-02-24 ENCOUNTER — Telehealth: Payer: Self-pay | Admitting: Family Medicine

## 2019-02-24 ENCOUNTER — Other Ambulatory Visit: Payer: Self-pay | Admitting: Internal Medicine

## 2019-02-24 ENCOUNTER — Telehealth (INDEPENDENT_AMBULATORY_CARE_PROVIDER_SITE_OTHER): Payer: PPO | Admitting: Family Medicine

## 2019-02-24 DIAGNOSIS — I5023 Acute on chronic systolic (congestive) heart failure: Secondary | ICD-10-CM

## 2019-02-24 DIAGNOSIS — E1129 Type 2 diabetes mellitus with other diabetic kidney complication: Secondary | ICD-10-CM

## 2019-02-24 DIAGNOSIS — IMO0002 Reserved for concepts with insufficient information to code with codable children: Secondary | ICD-10-CM

## 2019-02-24 DIAGNOSIS — I10 Essential (primary) hypertension: Secondary | ICD-10-CM

## 2019-02-24 DIAGNOSIS — I255 Ischemic cardiomyopathy: Secondary | ICD-10-CM

## 2019-02-24 MED ORDER — POTASSIUM CHLORIDE CRYS ER 10 MEQ PO TBCR: 30.0000 meq | EXTENDED_RELEASE_TABLET | Freq: Two times a day (BID) | ORAL | 0 refills | Status: DC

## 2019-02-24 MED ORDER — TORSEMIDE 20 MG PO TABS: 80.0000 mg | ORAL_TABLET | Freq: Two times a day (BID) | ORAL | 1 refills | Status: DC

## 2019-02-24 NOTE — Progress Notes (Signed)
Pt states she need a Hospital follow-up/general follow-up.  Pt states she was in the ED 5/12 and they did change one of her medications. She also states she needs a refill on her torsemide that was changed to 160 mg.

## 2019-02-24 NOTE — Progress Notes (Signed)
Virtual Visit Note  I connected with patient on 02/24/19 at 329pm by phone and verified that I am speaking with the correct person using two identifiers. [name] is currently located at home and patient is currently with them during visit. The provider, Rutherford Guys, MD is located in their office at time of visit.  I discussed the limitations, risks, security and privacy concerns of performing an evaluation and management service by telephone and the availability of in person appointments. I also discussed with the patient that there may be a patient responsible charge related to this service. The patient expressed understanding and agreed to proceed.   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,MSSA bacteremia2/2 osteomyelitis s/p amputation,systolicCHF, OSA on cpap, DM2 on insulin, CKD, HTN, HLP, anemia here for hosp followup   hosp from 5/12-19 for CHF exacerbation Torsemide 80mg  BID Changed coreg to metoprolol XL Lost about 20 lbs Still having good UOP Breathing better not at baseline Still having orthopnea, sleeping in her recliner Edema better but not normal Still having tight abdomen She has been taking 20meq KCL at night In the hospital she was taking 96meq BID At home today 294 lbs  Cards is planning on holter as thinks arrhythmia might be worsening CHF Sees them next week  [place] has resumed, [facility] will also resume  cbg last night 254 Took 12 units of lantus This morning 268  Used to take 16 units   BP 112/64 today  Lab Results  Component Value Date   CREATININE 1.79 (H) 02/23/2019   BUN 70 (H) 02/23/2019   NA 137 02/23/2019   K 4.3 02/23/2019   CL 97 (L) 02/23/2019   CO2 30 02/23/2019    No Known Allergies  Prior to Admission medications   Medication Sig Start Date End Date Taking? Authorizing Provider  aspirin  EC 81 MG EC tablet Take 1 tablet (81 mg total) by mouth daily. 11/24/18  Yes Epifanio Lesches, MD  Calcium Carbonate-Vitamin D3 (CALCIUM 600-D) 600-400 MG-UNIT TABS Take 1 tablet by mouth 2 (two) times a day.   Yes [provider]  clopidogrel (PLAVIX) 75 MG tablet Take 1 tablet (75 mg total) by mouth daily. 12/24/18  Yes Rutherford Guys, MD  ferrous sulfate 325 (65 FE) MG tablet Take 325 mg by mouth 2 (two) times daily with a meal.   Yes [provider]  gabapentin (NEURONTIN) 600 MG tablet Take 1 tablet (600 mg total) by mouth 2 (two) times daily. Takes 1 in the morning and two at bedtime Patient taking differently: Take 600 mg by mouth 2 (two) times daily. Take 1 tablet (600mg ) by mouth every morning and take 2 tablets (1200mg ) by mouth at bedtime 12/24/18  Yes Rutherford Guys, MD  glimepiride (AMARYL) 4 MG tablet Take 4 mg by mouth 2 (two) times daily.   Yes [provider]  glucose blood (ONE TOUCH ULTRA TEST) test strip USE AS DIRECTED TO CHECK BLOOD SUGAR 3 TIMES DAILY 01/09/19  Yes Rutherford Guys, MD  insulin glargine (LANTUS) 100 UNIT/ML injection Inject 0.16 mLs (16 Units total) into the skin at bedtime. Patient taking differently: Inject 12 Units into the skin at bedtime.  11/24/18  Yes Epifanio Lesches, MD  metoprolol succinate (TOPROL-XL) 50 MG 24 hr tablet Take 1 tablet (50 mg total) by mouth daily. Take with or immediately following a meal.  02/24/19 04/25/19 Yes [name], MD  Multiple Vitamin (MULTIVITAMIN) tablet Take 1 tablet by mouth daily.   Yes [provider]  Omega-3 Fatty Acids (FISH OIL) 1000 MG CAPS Take 1 capsule by mouth 2 (two) times a day.    Yes [provider]  potassium chloride (K-DUR,KLOR-CON) 10 MEQ tablet Take 1 tablet (10 mEq total) by mouth as needed. On days you take the 3rd torsemide dose Patient taking differently: Take 10 mEq by mouth daily.  01/01/19  Yes Hackney, Otila Kluver A, FNP  rosuvastatin (CRESTOR) 40  MG tablet TAKE 1 TABLET BY MOUTH ONCE DAILY Patient taking differently: Take 40 mg by mouth daily.  02/08/19  Yes Wellington Hampshire, MD  torsemide (DEMADEX) 20 MG tablet Take 4 tablets (80 mg total) by mouth 2 (two) times daily. 02/08/19  Yes Rutherford Guys, MD  traMADol (ULTRAM) 50 MG tablet Take 1 tablet (50 mg total) by mouth every 6 (six) hours as needed. 01/07/19  Yes Rutherford Guys, MD    Past Medical History:  Diagnosis Date  . Acute myocardial infarction, subendocardial infarction, initial episode of care ([place]) 07/21/2012  . Acute osteomyelitis involving ankle and foot ([place]) 02/06/2015  . Acute systolic heart failure ([place]) 07/21/2012   New onset 07/19/12; admission to [facility] ED. Elevated Troponins.  S/p 2D-echo with EF 20-25%.  S/p cardiac catheterization with stenting LAD.  Repeat 2D-echo 10/2011 with improved EF of 35%.   . Anemia   . Automatic implantable cardioverter-defibrillator in situ    a. MDT CRT-D 06/2014, SN: [id number] H  .removed in feb 2020  . CAD (coronary artery disease)    a. cardiac cath 101/04/2012: PCI/DES to chronically occluded mLAD, consideration PCI to diag branch in 4 weeks.   . Cataract   . Chicken pox   . Chronic kidney disease   . Chronic systolic CHF (congestive heart failure) (HCC)    a. mixed ICM & NICM; b. EF 20-25% by echo 07/2012, mid-dist 2/3 of LV sev HK/AK, mild MR. echo 10/2012: EF 30-35%, sev HK ant-septal & inf walls, GR1DD, mild MR, PASP 33. c. echo 02/2013: EF 30%, GR1DD, mild MR. echo 04/2014: EF 30%, Septal-lat dyssynchrony, global HK, inf AK, GR1DD, mild MR. d. echo 10/2014: EF50-55%, WM nl, GR1DD, septal mild paradox. e. echo 02/2015: EF 50-55%, wm   . Depression   . Heart attack ([place])   . Heel ulcer ([place]) 04/27/2015  . History of blood transfusion ~ 2011   "plasma; had neuropathy; couldn't walk"  . Hypertension   . LBBB (left bundle branch block)   . Neuromuscular disorder ([place])   . Neuropathy 2011  . Obesity, unspecified   . OSA on CPAP     Moderate with AHI 23/hr and now on CPAP at 16cm H2O.  Her DME is [facility]  . Pure hypercholesterolemia   . Sepsis ([place]) 09/2018  . Type II diabetes mellitus ([place])   . Unspecified vitamin D deficiency     Past Surgical History:  Procedure Laterality Date  . AMPUTATION TOE Right 06/18/2015   Procedure: AMPUTATION TOE;  Surgeon: Samara Deist, DPM;  Location: [facility] ORS;  Service: Podiatry;  Laterality: Right;  . AMPUTATION TOE Left 10/08/2018   Procedure: AMPUTATION TOE LEFT 2ND;  Surgeon: Albertine Patricia, DPM;  Location: [facility] ORS;  Service: Podiatry;  Laterality: Left;  . BACK SURGERY    . BI-VENTRICULAR IMPLANTABLE CARDIOVERTER DEFIBRILLATOR N/A 07/06/2014   Procedure: BI-VENTRICULAR IMPLANTABLE CARDIOVERTER DEFIBRILLATOR  (CRT-D);  Surgeon: Revonda Standard  Caryl Comes, MD;  Location: [facility] CATH LAB;  Service: Cardiovascular;  Laterality: N/A;  . BI-VENTRICULAR IMPLANTABLE CARDIOVERTER DEFIBRILLATOR  (CRT-D)  07/06/2014  . BILATERAL OOPHORECTOMY  01/2011   ovarian cyst benign  . COLONOSCOPY WITH PROPOFOL Left 02/22/2015   Procedure: COLONOSCOPY WITH PROPOFOL;  Surgeon: Hulen Luster, MD;  Location: [facility] ENDOSCOPY;  Service: Endoscopy;  Laterality: Left;  . CORONARY ANGIOPLASTY WITH STENT PLACEMENT Left 07/2012   new onset systolic CHF; elevated troponins.  Cardiac catheterization with stenting to LAD; EF 15%.  2D-echo: EF 20-25%.  . ESOPHAGOGASTRODUODENOSCOPY N/A 02/22/2015   Procedure: ESOPHAGOGASTRODUODENOSCOPY (EGD);  Surgeon: Hulen Luster, MD;  Location: [facility] ENDOSCOPY;  Service: Endoscopy;  Laterality: N/A;  . INCISION AND DRAINAGE ABSCESS Right 2007   groin; with ICU stay due to sepsis.  Marland Kitchen LAPAROSCOPIC CHOLECYSTECTOMY  2011  . LEFT HEART CATH AND CORONARY ANGIOGRAPHY N/A 10/05/2018   Procedure: LEFT HEART CATH AND CORONARY ANGIOGRAPHY;  Surgeon: Minna Merritts, MD;  Location: [place] CV LAB;  Service: Cardiovascular;  Laterality: N/A;  . LEFT HEART CATHETERIZATION WITH CORONARY ANGIOGRAM N/A 07/21/2012    Procedure: LEFT HEART CATHETERIZATION WITH CORONARY ANGIOGRAM;  Surgeon: Jolaine Artist, MD;  Location: [facility] CATH LAB;  Service: Cardiovascular;  Laterality: N/A;  . [place]   L4-5  . PACEMAKER REMOVAL  11/2018   due to infection around pacemaker  . PERCUTANEOUS CORONARY STENT INTERVENTION (PCI-S) N/A 07/23/2012   Procedure: PERCUTANEOUS CORONARY STENT INTERVENTION (PCI-S);  Surgeon: Sherren Mocha, MD;  Location: [facility] CATH LAB;  Service: Cardiovascular;  Laterality: N/A;  . PERIPHERAL VASCULAR BALLOON ANGIOPLASTY Left 10/06/2018   Procedure: PERIPHERAL VASCULAR BALLOON ANGIOPLASTY;  Surgeon: Katha Cabal, MD;  Location: [place] CV LAB;  Service: Cardiovascular;  Laterality: Left;  . PERIPHERAL VASCULAR CATHETERIZATION N/A 02/10/2015   Procedure: Picc Line Insertion;  Surgeon: Katha Cabal, MD;  Location: [place] CV LAB;  Service: Cardiovascular;  Laterality: N/A;  . TEE WITHOUT CARDIOVERSION N/A 10/02/2018   Procedure: TRANSESOPHAGEAL ECHOCARDIOGRAM (TEE);  Surgeon: Wellington Hampshire, MD;  Location: [facility] ORS;  Service: Cardiovascular;  Laterality: N/A;  . [place]  . VAGINAL HYSTERECTOMY  01/2011   Fibroids/DUB.  Ovaries removed. [place].    Social History   Tobacco Use  . Smoking status: Never Smoker  . Smokeless tobacco: Never Used  Substance Use Topics  . Alcohol use: No    Alcohol/week: 0.0 standard drinks    Family History  Problem Relation Age of Onset  . Diabetes Mother   . Hypertension Mother   . Arthritis Mother        knees, lumbar DDD, cervical DDD  . Cancer Father        prostate,skin,lymphoma.  . Cancer Brother 32       bladder cancer; non-smoker  . Diabetes Maternal Grandmother   . Heart disease Maternal Grandmother   . COPD Maternal Grandmother   . Diabetes Paternal Grandmother   . Obesity Brother   . Diabetes Son   . Hypertension Son   . Cancer Maternal Grandfather   . Diabetes Paternal Grandfather      ROS Per hpi  Objective  Vitals as reported by the patient: per above  Wt Readings from Last 3 Encounters:  02/23/19 298 lb 9.6 oz (135.4 kg)  01/25/19 (!) 304 lb (137.9 kg)  01/20/19 295 lb (133.8 kg)     ASSESSMENT and PLAN  1. Acute on chronic systolic CHF (congestive heart failure) ([place])  Cont to slowly diuresis. Has upcoming cards appt. meds refilled. Bmp per [place] next week.   2. DM type 2, uncontrolled, with renal complications (HCC) Discussed titrating lantus upto fasting goal, 90-130.  3. Essential hypertension, benign Controlled. Continue current regime.   Other orders - torsemide (DEMADEX) 20 MG tablet; Take 4 tablets (80 mg total) by mouth 2 (two) times daily. - potassium chloride (K-DUR) 10 MEQ tablet; Take 3 tablets (30 mEq total) by mouth 2 (two) times daily.  FOLLOW-UP: 1 week with bmp prior   The above assessment and management plan was discussed with the patient. The patient verbalized understanding of and has agreed to the management plan. Patient is aware to call the clinic if symptoms persist or worsen. Patient is aware when to return to the clinic for a follow-up visit. Patient educated on when it is appropriate to go to the emergency department.    I provided 26 minutes of non-face-to-face time during this encounter.  Rutherford Guys, MD Primary Care at [facility], [place] [address] Ph.  [phone] Fax [phone]

## 2019-02-24 NOTE — Telephone Encounter (Signed)
Copied from [place] [id number]. Topic: General - Other >> Feb 24, 2019  1:28 PM Rainey Pines A wrote: Rip Harbour the home health nurse asked for a callback from Dr. Pamella Pert or nurse in regards to patients medication. Best contact number [phone]

## 2019-02-24 NOTE — Telephone Encounter (Signed)
  Patient contacted regarding discharge from [facility] on 02/23/19.  Patient understands to follow up with provider Christell Faith, PA on 03/02/19 at 1:30 pm at [facility] in office. Patient understands discharge instructions? yes Patient understands medications and regiment? yes Patient understands to bring all medications to this visit? Yes  Patient is aware [facility] will be calling in the next day or two to verify her address and insurance. Then is will take about 1 week for her to receive it in the mail. She was appreciative and no other issues at this time.

## 2019-02-24 NOTE — Telephone Encounter (Signed)
Patient registered at [email or web address].  [name] is in the process of scheduling patient for follow up in 1 week.   Will call patient for TCM later.

## 2019-02-25 ENCOUNTER — Encounter: Payer: Self-pay | Admitting: Family

## 2019-02-25 ENCOUNTER — Other Ambulatory Visit: Payer: Self-pay | Admitting: Family Medicine

## 2019-02-25 ENCOUNTER — Telehealth: Payer: Self-pay | Admitting: Nurse Practitioner

## 2019-02-25 ENCOUNTER — Telehealth: Payer: Self-pay

## 2019-02-25 ENCOUNTER — Other Ambulatory Visit: Payer: Self-pay

## 2019-02-25 ENCOUNTER — Ambulatory Visit: Payer: PPO | Attending: Family | Admitting: Family

## 2019-02-25 VITALS — Wt 294.0 lb

## 2019-02-25 DIAGNOSIS — I5022 Chronic systolic (congestive) heart failure: Secondary | ICD-10-CM

## 2019-02-25 DIAGNOSIS — I10 Essential (primary) hypertension: Secondary | ICD-10-CM

## 2019-02-25 DIAGNOSIS — E1142 Type 2 diabetes mellitus with diabetic polyneuropathy: Secondary | ICD-10-CM

## 2019-02-25 DIAGNOSIS — J189 Pneumonia, unspecified organism: Secondary | ICD-10-CM | POA: Diagnosis not present

## 2019-02-25 DIAGNOSIS — I890 Lymphedema, not elsewhere classified: Secondary | ICD-10-CM

## 2019-02-25 DIAGNOSIS — G4733 Obstructive sleep apnea (adult) (pediatric): Secondary | ICD-10-CM | POA: Diagnosis not present

## 2019-02-25 DIAGNOSIS — A492 Hemophilus influenzae infection, unspecified site: Secondary | ICD-10-CM | POA: Diagnosis not present

## 2019-02-25 DIAGNOSIS — Z1231 Encounter for screening mammogram for malignant neoplasm of breast: Secondary | ICD-10-CM

## 2019-02-25 MED ORDER — POTASSIUM CHLORIDE CRYS ER 10 MEQ PO TBCR: 30.0000 meq | EXTENDED_RELEASE_TABLET | Freq: Two times a day (BID) | ORAL | 0 refills | Status: DC

## 2019-02-25 NOTE — Patient Instructions (Signed)
Continue weighing daily and call for an overnight weight gain of > 2 pounds or a weekly weight gain of >5 pounds. 

## 2019-02-25 NOTE — Telephone Encounter (Signed)
Spoke with Gertha Calkin from [facility] confirming that bmp will be drawn nx week and the results will be faxed. Caryl Pina was given approval by myself for orders of 1 week 1, 2 week 1, 1 week 6 and 2 prn for patient

## 2019-02-25 NOTE — Progress Notes (Signed)
Virtual Visit via Telephone Note    Evaluation Performed:  Follow-up visit  This visit type was conducted due to national recommendations for restrictions regarding the COVID-19 Pandemic (e.g. social distancing).  This format is felt to be most appropriate for this patient at this time.  All issues noted in this document were discussed and addressed.  No physical exam was performed (except for noted visual exam findings with Video Visits).  Please refer to the patient's chart (MyChart message for video visits and phone note for telephone visits) for the patient's consent to telehealth for [facility]  Date:  02/25/2019   ID:  [name], DOB 17-Apr-1956, MRN [id number]  Patient Location:  [place] [address]   Provider location:   [facility] [place] [address], [place] [address]  PCP:  Rutherford Guys, MD  Cardiologist:  Ida Rogue, MD  Electrophysiologist:  None   Chief Complaint:  fatigue  History of Present Illness:    [name] is a 63 y.o. female who presents via audio/video conferencing for a telehealth visit today.  Patient verified DOB and address.  The patient does not have symptoms concerning for COVID-19 infection (fever, chills, cough, or new SHORTNESS OF BREATH).   Patient reports moderate fatigue upon minimal exertion. She describes this as chronic in nature having been present for several years. She has associated difficulty sleeping, shortness of breath and swelling in her left foot/ abdomen (improving). She denies any dizziness, palpitations, cough or weight gain. She says that she's lost 24 pounds since she went into the hospital. Currently has PT and home health coming out. Is waiting to receive a holter monitor that she will wear for 2 weeks.   Prior CV studies:   The following studies were reviewed today:  Echo report from 11/23/2018 reviewed and showed an EF of 40-45%  Past Medical  History:  Diagnosis Date  . Acute myocardial infarction, subendocardial infarction, initial episode of care ([place]) 07/21/2012  . Acute osteomyelitis involving ankle and foot ([place]) 02/06/2015  . Acute systolic heart failure ([place]) 07/21/2012   New onset 07/19/12; admission to [facility] ED. Elevated Troponins.  S/p 2D-echo with EF 20-25%.  S/p cardiac catheterization with stenting LAD.  Repeat 2D-echo 10/2011 with improved EF of 35%.   . Anemia   . Automatic implantable cardioverter-defibrillator in situ    a. MDT CRT-D 06/2014, SN: [id number]  .removed in feb 2020  . CAD (coronary artery disease)    a. cardiac cath 101/04/2012: PCI/DES to chronically occluded mLAD, consideration PCI to diag branch in 4 weeks.   . Cataract   . Chicken pox   . Chronic kidney disease   . Chronic systolic CHF (congestive heart failure) (HCC)    a. mixed ICM & NICM; b. EF 20-25% by echo 07/2012, mid-dist 2/3 of LV sev HK/AK, mild MR. echo 10/2012: EF 30-35%, sev HK ant-septal & inf walls, GR1DD, mild MR, PASP 33. c. echo 02/2013: EF 30%, GR1DD, mild MR. echo 04/2014: EF 30%, Septal-lat dyssynchrony, global HK, inf AK, GR1DD, mild MR. d. echo 10/2014: EF50-55%, WM nl, GR1DD, septal mild paradox. e. echo 02/2015: EF 50-55%, wm   . Depression   . Heart attack ([place])   . Heel ulcer ([place]) 04/27/2015  . History of blood transfusion ~ 2011   "plasma; had neuropathy; couldn't walk"  . Hypertension   . LBBB (left bundle branch block)   . Neuromuscular disorder ([place])   .  Neuropathy 2011  . Obesity, unspecified   . OSA on CPAP    Moderate with AHI 23/hr and now on CPAP at 16cm H2O.  Her DME is [facility]  . Pure hypercholesterolemia   . Sepsis ([place]) 09/2018  . Type II diabetes mellitus ([place])   . Unspecified vitamin D deficiency    Past Surgical History:  Procedure Laterality Date  . AMPUTATION TOE Right 06/18/2015   Procedure: AMPUTATION TOE;  Surgeon: Samara Deist, DPM;  Location: [facility] ORS;  Service: Podiatry;  Laterality: Right;   . AMPUTATION TOE Left 10/08/2018   Procedure: AMPUTATION TOE LEFT 2ND;  Surgeon: Albertine Patricia, DPM;  Location: [facility] ORS;  Service: Podiatry;  Laterality: Left;  . BACK SURGERY    . BI-VENTRICULAR IMPLANTABLE CARDIOVERTER DEFIBRILLATOR N/A 07/06/2014   Procedure: BI-VENTRICULAR IMPLANTABLE CARDIOVERTER DEFIBRILLATOR  (CRT-D);  Surgeon: Deboraha Sprang, MD;  Location[facility] C[facility] LAB;  Service: Cardiovascular;  Laterality: N/A;  . BI-VENTRICULAR IMPLANTABLE CARDIOVERTER DEFIBRILLATOR  (CRT-D)  07/06/2014  . BILATERAL OOPHORECTOMY  01/2011   ovarian cyst benign  . COLONOSCOPY WITH PROPOFOL Left 02/22/2015   Procedure: COLONOSCOPY WITH PROPOFOL;  Surgeon: Hulen Luster, MD;  Location: [facility] ENDOSCOPY;  Service: Endoscopy;  Laterality: Left;  . CORONARY ANGIOPLASTY WITH STENT PLACEMENT Left 07/2012   new onset systolic CHF; elevated troponins.  Cardiac catheterization with stenting to LAD; EF 15%.  2D-echo: EF 20-25%.  . ESOPHAGOGASTRODUODENOSCOPY N/A 02/22/2015   Procedure: ESOPHAGOGASTRODUODENOSCOPY (EGD);  Surgeon: Hulen Luster, MD;  Location: [facility] ENDOSCOPY;  Service: Endoscopy;  Laterality: N/A;  . INCISION AND DRAINAGE ABSCESS Right 2007   groin; with ICU stay due to sepsis.  Marland Kitchen LAPAROSCOPIC CHOLECYSTECTOMY  2011  . LEFT HEART CATH AND CORONARY ANGIOGRAPHY N/A 10/05/2018   Procedure: LEFT HEART CATH AND CORONARY ANGIOGRAPHY;  Surgeon: Minna Merritts, MD;  Location: [place] CV LAB;  Service: Cardiovascular;  Laterality: N/A;  . LEFT HEART CATHETERIZATION WITH CORONARY ANGIOGRAM N/A 07/21/2012   Procedure: LEFT HEART CATHETERIZATION WITH CORONARY ANGIOGRAM;  Surgeon: Jolaine Artist, MD;  Location: [facility] CATH LAB;  Service: Cardiovascular;  Laterality: N/A;  . [place]   L4-5  . PACEMAKER REMOVAL  11/2018   due to infection around pacemaker  . PERCUTANEOUS CORONARY STENT INTERVENTION (PCI-S) N/A 07/23/2012   Procedure: PERCUTANEOUS CORONARY STENT INTERVENTION (PCI-S);  Surgeon:  Sherren Mocha, MD;  Location: [facility] CATH LAB;  Service: Cardiovascular;  Laterality: N/A;  . PERIPHERAL VASCULAR BALLOON ANGIOPLASTY Left 10/06/2018   Procedure: PERIPHERAL VASCULAR BALLOON ANGIOPLASTY;  Surgeon: Katha Cabal, MD;  Location: [place] CV LAB;  Service: Cardiovascular;  Laterality: Left;  . PERIPHERAL VASCULAR CATHETERIZATION N/A 02/10/2015   Procedure: Picc Line Insertion;  Surgeon: Katha Cabal, MD;  Location: [place] CV LAB;  Service: Cardiovascular;  Laterality: N/A;  . TEE WITHOUT CARDIOVERSION N/A 10/02/2018   Procedure: TRANSESOPHAGEAL ECHOCARDIOGRAM (TEE);  Surgeon: Wellington Hampshire, MD;  Location: [facility] ORS;  Service: Cardiovascular;  Laterality: N/A;  . [place]  . VAGINAL HYSTERECTOMY  01/2011   Fibroids/DUB.  Ovaries removed. [place].     Current Meds  Medication Sig  . aspirin EC 81 MG EC tablet Take 1 tablet (81 mg total) by mouth daily.  . Calcium Carbonate-Vitamin D3 (CALCIUM 600-D) 600-400 MG-UNIT TABS Take 1 tablet by mouth 2 (two) times a day.  . clopidogrel (PLAVIX) 75 MG tablet Take 1 tablet (75 mg total) by mouth daily.  . ferrous sulfate 325 (65 FE)  MG tablet Take 325 mg by mouth 2 (two) times daily with a meal.  . gabapentin (NEURONTIN) 600 MG tablet Take 1 tablet (600 mg total) by mouth 2 (two) times daily. Takes 1 in the morning and two at bedtime (Patient taking differently: Take 600 mg by mouth 2 (two) times daily. Take 1 tablet (600mg ) by mouth every morning and take 2 tablets (1200mg ) by mouth at bedtime)  . glimepiride (AMARYL) 4 MG tablet Take 4 mg by mouth 2 (two) times daily.  Marland Kitchen glucose blood (ONE TOUCH ULTRA TEST) test strip USE AS DIRECTED TO CHECK BLOOD SUGAR 3 TIMES DAILY  . insulin glargine (LANTUS) 100 UNIT/ML injection Inject 0.16 mLs (16 Units total) into the skin at bedtime. (Patient taking differently: Inject 14 Units into the skin at bedtime. )  . metoprolol succinate (TOPROL-XL) 50 MG 24 hr tablet Take 1  tablet (50 mg total) by mouth daily. Take with or immediately following a meal.  . Multiple Vitamin (MULTIVITAMIN) tablet Take 1 tablet by mouth daily.  . Omega-3 Fatty Acids (FISH OIL) 1000 MG CAPS Take 1 capsule by mouth 2 (two) times a day.   . potassium chloride (K-DUR) 10 MEQ tablet Take 3 tablets (30 mEq total) by mouth 2 (two) times daily.  . rosuvastatin (CRESTOR) 40 MG tablet TAKE 1 TABLET BY MOUTH ONCE DAILY (Patient taking differently: Take 40 mg by mouth daily. )  . torsemide (DEMADEX) 20 MG tablet Take 4 tablets (80 mg total) by mouth 2 (two) times daily.  . traMADol (ULTRAM) 50 MG tablet Take 1 tablet (50 mg total) by mouth every 6 (six) hours as needed.     Allergies:   Patient has no known allergies.   Social History   Tobacco Use  . Smoking status: Never Smoker  . Smokeless tobacco: Never Used  Substance Use Topics  . Alcohol use: No    Alcohol/week: 0.0 standard drinks  . Drug use: No     Family Hx: The patient's family history includes Arthritis in her mother; COPD in her maternal grandmother; Cancer in her father and maternal grandfather; Cancer (age of onset: 77) in her brother; Diabetes in her maternal grandmother, mother, paternal grandfather, paternal grandmother, and son; Heart disease in her maternal grandmother; Hypertension in her mother and son; Obesity in her brother.  ROS:   Please see the history of present illness.     All other systems reviewed and are negative.   Labs/Other Tests and Data Reviewed:    Recent Labs: 11/22/2018: TSH 4.684 02/16/2019: ALT 7; B Natriuretic Peptide 1,289.0 02/20/2019: Magnesium 2.1 02/23/2019: BUN 70; Creatinine, Ser 1.79; Hemoglobin 10.3; Platelets 175; Potassium 4.3; Sodium 137   Recent Lipid Panel Lab Results  Component Value Date/Time   CHOL 82 (L) 02/03/2019 04:11 PM   TRIG 101 02/03/2019 04:11 PM   HDL 37 (L) 02/03/2019 04:11 PM   CHOLHDL 2.2 02/03/2019 04:11 PM   CHOLHDL 5.6 (H) 08/06/2016 02:31 PM    LDLCALC 25 02/03/2019 04:11 PM    Wt Readings from Last 3 Encounters:  02/25/19 294 lb (133.4 kg)  02/23/19 298 lb 9.6 oz (135.4 kg)  01/25/19 (!) 304 lb (137.9 kg)     Exam:    Vital Signs:  Wt 294 lb (133.4 kg) Comment: self-reported  BMI 44.70 kg/m    Well nourished, well developed female in no  acute distress.   ASSESSMENT & PLAN:    1. Chronic heart failure with reduced ejection fraction- - NYHA class  III - mildly fluid overloaded today based on patient's description - weighing daily and she was reminded to call for an overnight weight gain of >2 pounds or a weekly weight gain of >5 pounds - weight down 24 pounds since hospital admission 02/16/2019 - doesn't add salt to her food (except tomatoes) but admits that she often eats salty foods. - saw EP Caryl Comes) 10/27/2018 ; holter to be placed and worn for 2 weeks (per patient report) - saw cardiology ([place]) 03/02/2019 - BNP 02/16/2019 was 1289.0 - wears oxygen @ 2L at bedtime - pacemaker has been removed due to infection - PT working with patient at her home  2: HTN- - not checking her BP at home - had telemedicine visit with PCP Pamella Pert) 02/24/2019 - BMP from 02/23/2019 reviewed and showed sodium 137, potassium 4.3, creatinine 1.79 and GFR 29  3: DM- - A1c 02/03/2019 was 5.6% - glucose was in the 300's last night  4: Lymphedema- - stage 2 - does elevate her legs but says that her edema continues - has tried wearing support socks but says that they cut into her leg behind her knee causing pain - recently started PT - had been unable to wear compression boots with edema but is going to try wearing them again since she's lost so much weight  COVID-19 Education: The signs and symptoms of COVID-19 were discussed with the patient and how to seek care for testing (follow up with PCP or arrange E-visit).  The importance of social distancing was discussed today.  Patient Risk:   After full review of this patients clinical  status, I feel that they are at least moderate risk at this time.  Time:   Today, I have spent 11 minutes with the patient with telehealth technology discussing medications, diet and symptoms to report.     Medication Adjustments/Labs and Tests Ordered: Current medicines are reviewed at length with the patient today.  Concerns regarding medicines are outlined above.   Tests Ordered: No orders of the defined types were placed in this encounter.  Medication Changes: No orders of the defined types were placed in this encounter.   Disposition:  Follow-up in 1 month or sooner for any questions/problems before then.   Signed, Alisa Graff, FNP  02/25/2019 10:36 AM    [facility]

## 2019-02-25 NOTE — Telephone Encounter (Signed)
Copied from [place] [id number]. Topic: Quick Communication - Rx Refill/Question >> Feb 25, 2019 11:57 AM Virl Axe D wrote: Medication: potassium chloride (K-DUR) 10 MEQ tablet / Pt stated the pharmacy did not refill for potassium. Requesting it be resent  Has the patient contacted their pharmacy? Yes.   (Agent: If no, request that the patient contact the pharmacy for the refill.) (Agent: If yes, when and what did the pharmacy advise?)  Preferred Pharmacy (with phone number or street name): [place], [place] [facility] [place] [phone] (Phone) [phone] (Fax)    Agent: Please be advised that RX refills may take up to 3 business days. We ask that you follow-up with your pharmacy.

## 2019-02-25 NOTE — Telephone Encounter (Signed)
I called [name] and left a message for PC followup visit today at 10am. Asked to return call for visit or to reschedule. Contact information left.

## 2019-02-25 NOTE — Progress Notes (Deleted)
Cardiology Office Note Date:  02/25/2019  Patient ID:  [name], DOB 17-Oct-1955, MRN [id number] PCP:  Rutherford Guys, MD  Cardiologist:  Dr. Rockey Situ, MD Primary Electrophysiologist: Dr. Caryl Comes, MD  ***refresh   Chief Complaint: Hospital follow up  History of Present Illness: [name] is a 63 y.o. female with history of CAD s/p PCI to the mid LAD in 07/2012, chronic combined systolic and diastolic CHF secondary to mixed ICM/NICM with LBBB s/p CRT-D in 2015 with subsequent improvement in EF to 50% by echo in 09/2018, osteomyelitis s/p toe amputation, recurrent MSSA bacteremia s/p CRT-D/lead extraction at [facility] in 12/2018, PAD s/p stenting of the left SFA and popliteal arteries along with balloon angioplasty of the left anterior tibial artery in 10/2018, DM2, HTN, HLD, morbid obesity, OSA not compliant with CPAP who presents for hospital follow up after recent admission to [facility] from 5/12-5/19 for acute on chronic combined CHF.   Most recent LHC from 09/2018 in the setting of admission for MSSA bacteremia secondary to osteomyelitis of the left 2nd toe with mildly elevated troponin showed severe diffuse disease of the LAD and diagonal branch with patent proximal LAD stent with severe stenosis at the distal edge of the stent with the remainder of the LAD being small, mild disease of the LCx and OM along the mild disease of the large RCA. Placing a short stent in the distal edge of the previously placed LAD stent was felt to provide little clinical benefit with high risk and would delay her needed toe amputation secondary to osteomyelitis. Therefore, medical management was advised. 2D echo at that time showed an EF of 45-50%. TEE showed no evidence of valve vegetation or wire associated vegetation. She was readmitted in 11/2018 with recurrent MSSA bacteremia and was transferred to [facility] where she underwent successful aspiration of TV/lead thrombus and CRT-D/lead extraction with recommendation she  may not be a good candidate for reimplantation of device given her comorbid conditions. CT suggestive of possible septic emboli. She was seen by EP in 01/2019 noting increased SOB felt to be multifactorial. Her torsemide was changed to 40 mg q AM and 20 mg q PM. Later in 01/2019, she was seen in the ED with volume overload and given IV Lasix with outpatient follow up. She was subsequently admitted to [facility] on 5/12 with acute on chronic combined CHF with a reported ~ 40 pounds weight gain in the prior 2 weeks. Initial BNP of 1289. Admission weight documented at 320 pounds with a dry weight around 282-286 pounds. She was diuresed with improvement in SOB and leg swelling to near baseline with a discharge weight of 297 pounds.   Discharge labs: SCr 1.79 (baseline ~ 1.6-1.7), potassium 4.3, magnesium 2.1, HGB 10.3 (baseline ~ 8-10)  Discharge cardiac medications:  ASA 81 mg, Plavix 75 mg, Toprol XL 50 mg daily (in place of Coreg), KCl 10 mEq when taking a 3rd torsemide dose, Crestor 40 mg, torsemide 80 mg bid along with her noncardiac medications as outlined below  ***   Past Medical History:  Diagnosis Date  . Acute myocardial infarction, subendocardial infarction, initial episode of care ([place]) 07/21/2012  . Acute osteomyelitis involving ankle and foot ([place]) 02/06/2015  . Acute systolic heart failure ([place]) 07/21/2012   New onset 07/19/12; admission to [facility] ED. Elevated Troponins.  S/p 2D-echo with EF 20-25%.  S/p cardiac catheterization with stenting LAD.  Repeat 2D-echo 10/2011 with improved EF of 35%.   . Anemia   .  Automatic implantable cardioverter-defibrillator in situ    a. MDT CRT-D 06/2014, SN: [id number] H  .removed in feb 2020  . CAD (coronary artery disease)    a. cardiac cath 101/04/2012: PCI/DES to chronically occluded mLAD, consideration PCI to diag branch in 4 weeks.   . Cataract   . Chicken pox   . Chronic kidney disease   . Chronic systolic CHF (congestive heart failure) (HCC)     a. mixed ICM & NICM; b. EF 20-25% by echo 07/2012, mid-dist 2/3 of LV sev HK/AK, mild MR. echo 10/2012: EF 30-35%, sev HK ant-septal & inf walls, GR1DD, mild MR, PASP 33. c. echo 02/2013: EF 30%, GR1DD, mild MR. echo 04/2014: EF 30%, Septal-lat dyssynchrony, global HK, inf AK, GR1DD, mild MR. d. echo 10/2014: EF50-55%, WM nl, GR1DD, septal mild paradox. e. echo 02/2015: EF 50-55%, wm   . Depression   . Heart attack ([place])   . Heel ulcer ([place]) 04/27/2015  . History of blood transfusion ~ 2011   "plasma; had neuropathy; couldn't walk"  . Hypertension   . LBBB (left bundle branch block)   . Neuromuscular disorder ([place])   . Neuropathy 2011  . Obesity, unspecified   . OSA on CPAP    Moderate with AHI 23/hr and now on CPAP at 16cm H2O.  Her DME is [facility]  . Pure hypercholesterolemia   . Sepsis ([place]) 09/2018  . Type II diabetes mellitus ([place])   . Unspecified vitamin D deficiency     Past Surgical History:  Procedure Laterality Date  . AMPUTATION TOE Right 06/18/2015   Procedure: AMPUTATION TOE;  Surgeon: Samara Deist, DPM;  Location: [facility] ORS;  Service: Podiatry;  Laterality: Right;  . AMPUTATION TOE Left 10/08/2018   Procedure: AMPUTATION TOE LEFT 2ND;  Surgeon: Albertine Patricia, DPM;  Location: [facility] ORS;  Service: Podiatry;  Laterality: Left;  . BACK SURGERY    . BI-VENTRICULAR IMPLANTABLE CARDIOVERTER DEFIBRILLATOR N/A 07/06/2014   Procedure: BI-VENTRICULAR IMPLANTABLE CARDIOVERTER DEFIBRILLATOR  (CRT-D);  Surgeon: Deboraha Sprang, MD;  Location: [facility] CATH LAB;  Service: Cardiovascular;  Laterality: N/A;  . BI-VENTRICULAR IMPLANTABLE CARDIOVERTER DEFIBRILLATOR  (CRT-D)  07/06/2014  . BILATERAL OOPHORECTOMY  01/2011   ovarian cyst benign  . COLONOSCOPY WITH PROPOFOL Left 02/22/2015   Procedure: COLONOSCOPY WITH PROPOFOL;  Surgeon: Hulen Luster, MD;  Location: [facility] ENDOSCOPY;  Service: Endoscopy;  Laterality: Left;  . CORONARY ANGIOPLASTY WITH STENT PLACEMENT Left 07/2012   new onset systolic CHF; elevated  troponins.  Cardiac catheterization with stenting to LAD; EF 15%.  2D-echo: EF 20-25%.  . ESOPHAGOGASTRODUODENOSCOPY N/A 02/22/2015   Procedure: ESOPHAGOGASTRODUODENOSCOPY (EGD);  Surgeon: Hulen Luster, MD;  Location: [facility] ENDOSCOPY;  Service: Endoscopy;  Laterality: N/A;  . INCISION AND DRAINAGE ABSCESS Right 2007   groin; with ICU stay due to sepsis.  Marland Kitchen LAPAROSCOPIC CHOLECYSTECTOMY  2011  . LEFT HEART CATH AND CORONARY ANGIOGRAPHY N/A 10/05/2018   Procedure: LEFT HEART CATH AND CORONARY ANGIOGRAPHY;  Surgeon: Minna Merritts, MD;  Location: [place] CV LAB;  Service: Cardiovascular;  Laterality: N/A;  . LEFT HEART CATHETERIZATION WITH CORONARY ANGIOGRAM N/A 07/21/2012   Procedure: LEFT HEART CATHETERIZATION WITH CORONARY ANGIOGRAM;  Surgeon: Jolaine Artist, MD;  Location[facility] CATH LAB;  Service: Cardiovascular;  Laterality: N/A;  . [place]   L4-5  . PACEMAKER REMOVAL  11/2018   due to infection around pacemaker  . PERCUTANEOUS CORONARY STENT INTERVENTION (PCI-S) N/A 07/23/2012   Procedure: PERCUTANEOUS CORONARY STENT INTERVENTION (PCI-S);  Surgeon: Sherren Mocha, MD;  Location: [facility] CATH LAB;  Service: Cardiovascular;  Laterality: N/A;  . PERIPHERAL VASCULAR BALLOON ANGIOPLASTY Left 10/06/2018   Procedure: PERIPHERAL VASCULAR BALLOON ANGIOPLASTY;  Surgeon: Katha Cabal, MD;  Location: [place] CV LAB;  Service: Cardiovascular;  Laterality: Left;  . PERIPHERAL VASCULAR CATHETERIZATION N/A 02/10/2015   Procedure: Picc Line Insertion;  Surgeon: Katha Cabal, MD;  Location: [place] CV LAB;  Service: Cardiovascular;  Laterality: N/A;  . TEE WITHOUT CARDIOVERSION N/A 10/02/2018   Procedure: TRANSESOPHAGEAL ECHOCARDIOGRAM (TEE);  Surgeon: Wellington Hampshire, MD;  Location: [facility] ORS;  Service: Cardiovascular;  Laterality: N/A;  . [place]  . VAGINAL HYSTERECTOMY  01/2011   Fibroids/DUB.  Ovaries removed. [place].    No outpatient medications  have been marked as taking for the 03/02/19 encounter (Appointment) with Rise Mu, PA-C.    Allergies:   Patient has no known allergies.   Social History:  The patient  reports that she has never smoked. She has never used smokeless tobacco. She reports that she does not drink alcohol or use drugs.   Family History:  The patient's family history includes Arthritis in her mother; COPD in her maternal grandmother; Cancer in her father and maternal grandfather; Cancer (age of onset: 41) in her brother; Diabetes in her maternal grandmother, mother, paternal grandfather, paternal grandmother, and son; Heart disease in her maternal grandmother; Hypertension in her mother and son; Obesity in her brother.  ROS:   ROS   PHYSICAL EXAM: *** VS:  There were no vitals taken for this visit. BMI: There is no height or weight on file to calculate BMI.  Physical Exam   EKG:  Was ordered and interpreted by me today. Shows ***  Recent Labs: 11/22/2018: TSH 4.684 02/16/2019: ALT 7; B Natriuretic Peptide 1,289.0 02/20/2019: Magnesium 2.1 02/23/2019: BUN 70; Creatinine, Ser 1.79; Hemoglobin 10.3; Platelets 175; Potassium 4.3; Sodium 137  02/03/2019: Chol/HDL Ratio 2.2; Cholesterol, Total 82; HDL 37; LDL Calculated 25; Triglycerides 101   Estimated Creatinine Clearance: 45.4 mL/min (A) (by C-G formula based on SCr of 1.79 mg/dL (H)).   Wt Readings from Last 3 Encounters:  02/25/19 294 lb (133.4 kg)  02/23/19 298 lb 9.6 oz (135.4 kg)  01/25/19 (!) 304 lb (137.9 kg)     Other studies reviewed: Additional studies/records reviewed today include: summarized above  ASSESSMENT AND PLAN:  1. ***  Disposition: F/u with Dr. Marland Kitchen or an APP in ***.  Current medicines are reviewed at length with the patient today.  The patient did not have any concerns regarding medicines.  SignedChristell Faith, PA-C 02/25/2019 11:13 AM     [place] [address] [place], [place] [address]  [phone]

## 2019-02-26 ENCOUNTER — Telehealth: Payer: Self-pay

## 2019-02-26 ENCOUNTER — Telehealth: Payer: Self-pay | Admitting: Nurse Practitioner

## 2019-02-26 ENCOUNTER — Telehealth: Payer: Self-pay | Admitting: Family Medicine

## 2019-02-26 DIAGNOSIS — E1142 Type 2 diabetes mellitus with diabetic polyneuropathy: Secondary | ICD-10-CM | POA: Diagnosis not present

## 2019-02-26 DIAGNOSIS — R238 Other skin changes: Secondary | ICD-10-CM | POA: Diagnosis not present

## 2019-02-26 DIAGNOSIS — F329 Major depressive disorder, single episode, unspecified: Secondary | ICD-10-CM | POA: Diagnosis not present

## 2019-02-26 DIAGNOSIS — Z7982 Long term (current) use of aspirin: Secondary | ICD-10-CM | POA: Diagnosis not present

## 2019-02-26 DIAGNOSIS — I255 Ischemic cardiomyopathy: Secondary | ICD-10-CM | POA: Diagnosis not present

## 2019-02-26 DIAGNOSIS — I2690 Septic pulmonary embolism without acute cor pulmonale: Secondary | ICD-10-CM | POA: Diagnosis not present

## 2019-02-26 DIAGNOSIS — I447 Left bundle-branch block, unspecified: Secondary | ICD-10-CM | POA: Diagnosis not present

## 2019-02-26 DIAGNOSIS — Z95820 Peripheral vascular angioplasty status with implants and grafts: Secondary | ICD-10-CM | POA: Diagnosis not present

## 2019-02-26 DIAGNOSIS — Z5181 Encounter for therapeutic drug level monitoring: Secondary | ICD-10-CM | POA: Diagnosis not present

## 2019-02-26 DIAGNOSIS — Z955 Presence of coronary angioplasty implant and graft: Secondary | ICD-10-CM | POA: Diagnosis not present

## 2019-02-26 DIAGNOSIS — I130 Hypertensive heart and chronic kidney disease with heart failure and stage 1 through stage 4 chronic kidney disease, or unspecified chronic kidney disease: Secondary | ICD-10-CM | POA: Diagnosis not present

## 2019-02-26 DIAGNOSIS — I2510 Atherosclerotic heart disease of native coronary artery without angina pectoris: Secondary | ICD-10-CM | POA: Diagnosis not present

## 2019-02-26 DIAGNOSIS — I5022 Chronic systolic (congestive) heart failure: Secondary | ICD-10-CM | POA: Diagnosis not present

## 2019-02-26 DIAGNOSIS — Z89422 Acquired absence of other left toe(s): Secondary | ICD-10-CM | POA: Diagnosis not present

## 2019-02-26 DIAGNOSIS — Z794 Long term (current) use of insulin: Secondary | ICD-10-CM | POA: Diagnosis not present

## 2019-02-26 DIAGNOSIS — E1122 Type 2 diabetes mellitus with diabetic chronic kidney disease: Secondary | ICD-10-CM | POA: Diagnosis not present

## 2019-02-26 DIAGNOSIS — N189 Chronic kidney disease, unspecified: Secondary | ICD-10-CM | POA: Diagnosis not present

## 2019-02-26 DIAGNOSIS — E559 Vitamin D deficiency, unspecified: Secondary | ICD-10-CM | POA: Diagnosis not present

## 2019-02-26 DIAGNOSIS — D509 Iron deficiency anemia, unspecified: Secondary | ICD-10-CM | POA: Diagnosis not present

## 2019-02-26 DIAGNOSIS — G4733 Obstructive sleep apnea (adult) (pediatric): Secondary | ICD-10-CM | POA: Diagnosis not present

## 2019-02-26 DIAGNOSIS — Z89421 Acquired absence of other right toe(s): Secondary | ICD-10-CM | POA: Diagnosis not present

## 2019-02-26 DIAGNOSIS — I252 Old myocardial infarction: Secondary | ICD-10-CM | POA: Diagnosis not present

## 2019-02-26 DIAGNOSIS — Z6841 Body Mass Index (BMI) 40.0 and over, adult: Secondary | ICD-10-CM | POA: Diagnosis not present

## 2019-02-26 DIAGNOSIS — Z8631 Personal history of diabetic foot ulcer: Secondary | ICD-10-CM | POA: Diagnosis not present

## 2019-02-26 NOTE — Telephone Encounter (Signed)
Called patient to reschedule follow-up visit that was scheduled for 02/25/19, with no left message with reason for call requesting a call back.

## 2019-02-26 NOTE — Telephone Encounter (Signed)
Called patient to prescreen for OV on 5/26. No answer. LMTCB.

## 2019-02-26 NOTE — Telephone Encounter (Signed)
Copied from [place] [id number]. Topic: Quick Communication - Home Health Verbal Orders >> Feb 26, 2019 12:35 PM Erick Blinks wrote: Caller/Agency: [facility] Number: [phone]  Requesting OT/PT/Skilled Nursing/Social Work/Speech Therapy: CHF patient, reports good measurements today, however BP low and pt can feel the affects. Measurements starting this morning include  91/63 99/60 94/72  100/90 122/80 Rip Harbour is requesting fu call from providers to discuss reports of low BP. Please advise

## 2019-02-26 NOTE — Telephone Encounter (Signed)
Patient returning call.

## 2019-02-26 NOTE — Telephone Encounter (Signed)
I spoke with the patient regarding her COVID -19 screening questions for her in office visit on 5/26 with Christell Faith, PA.      COVID-19 Pre-Screening Questions:  . In the past 7 to 10 days have you had a cough,  shortness of breath, headache, congestion, fever (100 or greater) body aches, chills, sore throat, or sudden loss of taste or sense of smell? No . Have you been around anyone with known Covid 19. No . Have you been around anyone who is awaiting Covid 19 test results in the past 7 to 10 days? No . Have you been around anyone who has been exposed to Covid 19, or has mentioned symptoms of Covid 19 within the past 7 to 10 days? No

## 2019-03-02 ENCOUNTER — Ambulatory Visit: Payer: PPO | Admitting: Physician Assistant

## 2019-03-02 ENCOUNTER — Telehealth: Payer: Self-pay | Admitting: Family Medicine

## 2019-03-02 ENCOUNTER — Telehealth: Payer: Self-pay | Admitting: Cardiovascular Disease

## 2019-03-02 ENCOUNTER — Ambulatory Visit (INDEPENDENT_AMBULATORY_CARE_PROVIDER_SITE_OTHER): Payer: PPO

## 2019-03-02 DIAGNOSIS — I471 Supraventricular tachycardia: Secondary | ICD-10-CM

## 2019-03-02 NOTE — Progress Notes (Signed)
Cardiology Office Note Date:  03/04/2019  Patient ID:  [name], DOB [birth year], MRN [id number] PCP:  Rutherford Guys, MD  Cardiologist:  Dr. Rockey Situ, MD Primary Electrophysiologist: Dr. Caryl Comes, MD    Chief Complaint: Hospital follow up  History of Present Illness: [name] is a 63 y.o. 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   Most recent LHC from 09/2018 in the setting of admission for MSSA bacteremia secondary to osteomyelitis of the left 2nd toe with mildly elevated troponin showed severe diffuse disease of the LAD and diagonal branch with patent proximal LAD stent with severe stenosis at the distal edge of the stent with the remainder of the LAD being small, mild disease of the LCx and OM along the mild disease of the large RCA. Placing a short stent in the distal edge of the previously placed LAD stent was felt to provide little clinical benefit with high risk and would delay her needed toe amputation secondary to osteomyelitis. Therefore, medical management was advised. 2D echo at that time showed an EF of 45-50%. TEE showed no evidence of valve vegetation or wire associated vegetation. She was readmitted in 11/2018 with recurrent MSSA bacteremia and was transferred to [facility] where she underwent successful aspiration of TV/lead thrombus and CRT-D/lead extraction with recommendation she may not be a  good candidate for reimplantation of device given her comorbid conditions. CT suggestive of possible septic emboli. She was seen by EP in 01/2019 noting increased SOB felt to be multifactorial. Her torsemide was changed to 40 mg q AM and 20 mg q PM. Later in 01/2019, she was seen in the ED with volume overload and given IV Lasix with outpatient follow up. She was subsequently admitted to [facility] on 5/12 with acute on chronic combined CHF with a reported ~ 40 pound weight gain in the prior 2 weeks. Initial BNP of 1289. Admission weight documented at 320 pounds with a dry weight around 282-286 pounds. She was diuresed with improvement in SOB and leg swelling to near baseline with a discharge weight of 297 pounds.   Discharge labs: SCr 1.79 (baseline ~ 1.6-1.7), potassium 4.3, magnesium 2.1, HGB 10.3 (baseline ~ 8-10)  Discharge cardiac medications:  ASA 81 mg, Plavix 75 mg, Toprol XL 50 mg daily (in place of Coreg), KCl 10 mEq when taking a 3rd torsemide dose, Crestor 40 mg, torsemide 80 mg bid along with her noncardiac medications as outlined below.  Since her hospital discharge and with improving her diet she has noted soft blood pressures running in the 90s to low 220U systolic.  She has held her metoprolol since 5/22.  She comes in feeling well today.  Her weight is back to her baseline dry weight at 284 pounds today.  She notes significant improvement in lower extremity swelling.  She is now able to lay fully supine without issues.  She does continue to note some shortness of  breath and wonders if this is in the setting of her CRT explantation.  She continues to note fatigue as well.  No chest pain, palpitations, dizziness, presyncope, or syncope.  She has significantly cut out her salt intake.  She is currently wearing her ZIO monitor.  She has a follow-up echo scheduled for 6/10 as well as EP follow-up on 6/23.    Past Medical History:  Diagnosis Date   Acute myocardial infarction, subendocardial  infarction, initial episode of care ([place]) 07/21/2012   Acute osteomyelitis involving ankle and foot ([place]) 01/11/9628   Acute systolic heart failure ([place]) 07/21/2012   New onset 07/19/12; admission to [facility] ED. Elevated Troponins.  S/p 2D-echo with EF 20-25%.  S/p cardiac catheterization with stenting LAD.  Repeat 2D-echo 10/2011 with improved EF of 35%.    Anemia    Automatic implantable cardioverter-defibrillator in situ    a. MDT CRT-D 06/2014, SN: [id number]  .removed in feb 2020   CAD (coronary artery disease)    a. cardiac cath 101/04/2012: PCI/DES to chronically occluded mLAD, consideration PCI to diag branch in 4 weeks.    Cataract    Chicken pox    Chronic kidney disease    Chronic systolic CHF (congestive heart failure) (HCC)    a. mixed ICM & NICM; b. EF 20-25% by echo 07/2012, mid-dist 2/3 of LV sev HK/AK, mild MR. echo 10/2012: EF 30-35%, sev HK ant-septal & inf walls, GR1DD, mild MR, PASP 33. c. echo 02/2013: EF 30%, GR1DD, mild MR. echo 04/2014: EF 30%, Septal-lat dyssynchrony, global HK, inf AK, GR1DD, mild MR. d. echo 10/2014: EF50-55%, WM nl, GR1DD, septal mild paradox. e. echo 02/2015: EF 50-55%, wm    Depression    Heart attack ([place])    Heel ulcer ([place]) 04/27/2015   History of blood transfusion ~ 2011   "plasma; had neuropathy; couldn't walk"   Hypertension    LBBB (left bundle branch block)    Neuromuscular disorder ([place])    Neuropathy 2011   Obesity, unspecified    OSA on CPAP    Moderate with AHI 23/hr and now on CPAP at 16cm H2O.  Her DME is [facility]   Pure hypercholesterolemia    Sepsis ([place]) 09/2018   Type II diabetes mellitus ([place])    Unspecified vitamin D deficiency     Past Surgical History:  Procedure Laterality Date   AMPUTATION TOE Right 06/18/2015   Procedure: AMPUTATION TOE;  Surgeon: Samara Deist, DPM;  Location: [facility] ORS;  Service: Podiatry;  Laterality: Right;   AMPUTATION TOE Left 10/08/2018   Procedure: AMPUTATION TOE LEFT 2ND;   Surgeon: Albertine Patricia, DPM;  Location: [facility] ORS;  Service: Podiatry;  Laterality: Left;   BACK SURGERY     BI-VENTRICULAR IMPLANTABLE CARDIOVERTER DEFIBRILLATOR N/A 07/06/2014   Procedure: BI-VENTRICULAR IMPLANTABLE CARDIOVERTER DEFIBRILLATOR  (CRT-D);  Surgeon: Deboraha Sprang, MD;  Location: [facility] CATH LAB;  Service: Cardiovascular;  Laterality: N/A;   BI-VENTRICULAR IMPLANTABLE CARDIOVERTER DEFIBRILLATOR  (CRT-D)  07/06/2014   BILATERAL OOPHORECTOMY  01/2011   ovarian cyst benign   COLONOSCOPY WITH PROPOFOL Left 02/22/2015   Procedure: COLONOSCOPY WITH PROPOFOL;  Surgeon: Hulen Luster, MD;  Location: [facility] ENDOSCOPY;  Service: Endoscopy;  Laterality: Left;   CORONARY ANGIOPLASTY WITH STENT PLACEMENT Left 07/2012   new onset systolic CHF; elevated troponins.  Cardiac catheterization with stenting to LAD; EF 15%.  2D-echo: EF 20-25%.   ESOPHAGOGASTRODUODENOSCOPY N/A 02/22/2015   Procedure: ESOPHAGOGASTRODUODENOSCOPY (EGD);  Surgeon: Hulen Luster, MD;  Location:  [place] ENDOSCOPY;  Service: Endoscopy;  Laterality: N/A;   INCISION AND DRAINAGE ABSCESS Right 2007   groin; with ICU stay due to sepsis.   LAPAROSCOPIC CHOLECYSTECTOMY  2011   LEFT HEART CATH AND CORONARY ANGIOGRAPHY N/A 10/05/2018   Procedure: LEFT HEART CATH AND CORONARY ANGIOGRAPHY;  Surgeon: Minna Merritts, MD;  Location: [facility] CV LAB;  Service: Cardiovascular;  Laterality: N/A;   LEFT HEART CATHETERIZATION WITH CORONARY ANGIOGRAM N/A 07/21/2012   Procedure: LEFT HEART CATHETERIZATION WITH CORONARY ANGIOGRAM;  Surgeon: Jolaine Artist, MD;  Location: [facility] CATH LAB;  Service: Cardiovascular;  Laterality: N/A;   LUMBAR [name]   L4-5   PACEMAKER REMOVAL  11/2018   due to infection around pacemaker   [place] (PCI-S) N/A 07/23/2012   Procedure: PERCUTANEOUS CORONARY STENT INTERVENTION (PCI-S);  Surgeon: Sherren Mocha, MD;  Location: [facility] CATH LAB;  Service: Cardiovascular;   Laterality: N/A;   PERIPHERAL VASCULAR BALLOON ANGIOPLASTY Left 10/06/2018   Procedure: PERIPHERAL VASCULAR BALLOON ANGIOPLASTY;  Surgeon: Katha Cabal, MD;  Location: [facility] CV LAB;  Service: Cardiovascular;  Laterality: Left;   PERIPHERAL VASCULAR CATHETERIZATION N/A 02/10/2015   Procedure: Picc Line Insertion;  Surgeon: Katha Cabal, MD;  Location: [place] CV LAB;  Service: Cardiovascular;  Laterality: N/A;   TEE WITHOUT CARDIOVERSION N/A 10/02/2018   Procedure: TRANSESOPHAGEAL ECHOCARDIOGRAM (TEE);  Surgeon: Wellington Hampshire, MD;  Location: [facility] ORS;  Service: Cardiovascular;  Laterality: N/A;   [place]  01/2011   Fibroids/DUB.  Ovaries removed. [place].    Current Meds  Medication Sig   aspirin EC 81 MG EC tablet Take 1 tablet (81 mg total) by mouth daily.   Calcium Carbonate-Vitamin D3 (CALCIUM 600-D) 600-400 MG-UNIT TABS Take 1 tablet by mouth 2 (two) times a day.   clopidogrel (PLAVIX) 75 MG tablet Take 1 tablet (75 mg total) by mouth daily.   ferrous sulfate 325 (65 FE) MG tablet Take 325 mg by mouth 2 (two) times daily with a meal.   gabapentin (NEURONTIN) 600 MG tablet Take 1 tablet (600 mg total) by mouth 2 (two) times daily. Takes 1 in the morning and two at bedtime (Patient taking differently: Take 600 mg by mouth 2 (two) times daily. Take 1 tablet (600mg ) by mouth every morning and take 2 tablets (1200mg ) by mouth at bedtime)   glimepiride (AMARYL) 4 MG tablet Take 4 mg by mouth 2 (two) times daily.   glucose blood (ONE TOUCH ULTRA TEST) test strip USE AS DIRECTED TO CHECK BLOOD SUGAR 3 TIMES DAILY   insulin glargine (LANTUS) 100 UNIT/ML injection Inject 0.16 mLs (16 Units total) into the skin at bedtime. (Patient taking differently: Inject 14 Units into the skin at bedtime. )   Multiple Vitamin (MULTIVITAMIN) tablet Take 1 tablet by mouth daily.   Omega-3 Fatty Acids (FISH OIL) 1000 MG CAPS Take 1  capsule by mouth 2 (two) times a day.    potassium chloride (K-DUR) 10 MEQ tablet Take 3 tablets (30 mEq total) by mouth 2 (two) times daily.   rosuvastatin (CRESTOR) 40 MG tablet TAKE 1 TABLET BY MOUTH ONCE DAILY (Patient taking differently: Take 40 mg by mouth daily. )   torsemide (DEMADEX) 20 MG tablet Take 4 tablets (80 mg total) by mouth 2 (two) times daily.   traMADol (ULTRAM) 50 MG tablet Take 1 tablet (50 mg total) by mouth every 6 (six) hours as needed.   [DISCONTINUED]  metoprolol succinate (TOPROL-XL) 50 MG 24 hr tablet Take 1 tablet (50 mg total) by mouth daily. Take with or immediately following a meal.    Allergies:   Patient has no known allergies.   Social History:  The patient  reports that she has never smoked. She has never used smokeless tobacco. She reports that she does not drink alcohol or use drugs.   Family History:  The patient's family history includes Arthritis in her mother; COPD in her maternal grandmother; Cancer in her father and maternal grandfather; Cancer (age of onset: 34) in her brother; Diabetes in her maternal grandmother, mother, paternal grandfather, paternal grandmother, and son; Heart disease in her maternal grandmother; Hypertension in her mother and son; Obesity in her brother.  ROS:   Review of Systems  Constitutional: Positive for malaise/fatigue. Negative for chills, diaphoresis, fever and weight loss.  HENT: Negative for congestion.   Eyes: Negative for discharge and redness.  Respiratory: Positive for shortness of breath. Negative for cough, hemoptysis, sputum production and wheezing.   Cardiovascular: Negative for chest pain, palpitations, orthopnea, claudication, leg swelling and PND.  Gastrointestinal: Negative for abdominal pain, blood in stool, heartburn, melena, nausea and vomiting.  Genitourinary: Negative for hematuria.  Musculoskeletal: Negative for falls and myalgias.  Skin: Negative for rash.  Neurological: Positive for  weakness. Negative for dizziness, tingling, tremors, sensory change, speech change, focal weakness and loss of consciousness.  Endo/Heme/Allergies: Does not bruise/bleed easily.  Psychiatric/Behavioral: Negative for substance abuse. The patient is not nervous/anxious.   All other systems reviewed and are negative.    PHYSICAL EXAM:  VS:  BP 128/78 (BP Location: Left Arm, Patient Position: Sitting, Cuff Size: Normal)    Pulse 92    Ht 5\' 8"  (1.727 m)    Wt 284 lb (128.8 kg)    BMI 43.18 kg/m  BMI: Body mass index is 43.18 kg/m.  Physical Exam  Constitutional: She is oriented to person, place, and time. She appears well-developed and well-nourished.  HENT:  Head: Normocephalic and atraumatic.  Eyes: Right eye exhibits no discharge. Left eye exhibits no discharge.  Neck: Normal range of motion. No JVD present.  Cardiovascular: Normal rate, regular rhythm, S1 normal and S2 normal. Exam reveals no distant heart sounds, no friction rub, no midsystolic click and no opening snap.  Murmur heard. Pulses:      Posterior tibial pulses are 2+ on the right side and 2+ on the left side.  II/VI systolic murmur best heard along the left lower sternal border  Pulmonary/Chest: Effort normal and breath sounds normal. No respiratory distress. She has no decreased breath sounds. She has no wheezes. She has no rales. She exhibits no tenderness.  Abdominal: Soft. She exhibits no distension. There is no abdominal tenderness.  Musculoskeletal:        General: Edema present.     Comments: Trace bilateral pretibial edema  Neurological: She is alert and oriented to person, place, and time.  Skin: Skin is warm and dry. No cyanosis. Nails show no clubbing.  Psychiatric: She has a normal mood and affect. Her speech is normal and behavior is normal. Judgment and thought content normal.     EKG:  Was ordered and interpreted by me today. Shows NSR, 92 bpm, LBBB (known)  Recent Labs: 11/22/2018: TSH 4.684 02/16/2019:  ALT 7; B Natriuretic Peptide 1,289.0 02/20/2019: Magnesium 2.1 02/23/2019: BUN 70; Creatinine, Ser 1.79; Hemoglobin 10.3; Platelets 175; Potassium 4.3; Sodium 137  02/03/2019: Chol/HDL Ratio 2.2; Cholesterol, Total 82; HDL  37; LDL Calculated 25; Triglycerides 101   Estimated Creatinine Clearance: 44.5 mL/min (A) (by C-G formula based on SCr of 1.79 mg/dL (H)).   Wt Readings from Last 3 Encounters:  03/04/19 284 lb (128.8 kg)  02/25/19 294 lb (133.4 kg)  02/23/19 298 lb 9.6 oz (135.4 kg)     Other studies reviewed: Additional studies/records reviewed today include: summarized above  ASSESSMENT AND PLAN:  1. Chronic combined systolic and diastolic CHF: She appears grossly euvolemic and well compensated today.  She is currently at her dry weight with a range of 282 to 286 pounds.  Overall, she feels remarkably well though with some continued shortness of breath and fatigue.  She has not significantly changed her diet.  For now, we will continue torsemide 80 mg twice daily along with KCl 30 mEq twice daily.  We will check a BMP today given her weight loss to ensure we do not need to potentially decrease her diuretic dose.  CHF education discussed.  She will need to follow-up with EP as outlined below given her recent CRT-D explantation given her underlying cardiomyopathy and conduction disease.  2. Atrial tachycardia: Asymptomatic.  Twelve-lead EKG demonstrates sinus rhythm today.  She is currently wearing a ZIO patch to quantify atrial ectopy burden.  Metoprolol is on hold given the relative hypotension.  Follow-up with EP as directed.  3. CAD involving the native coronary arteries without angina: No symptoms concerning for angina.  Continue current medications including aspirin, Plavix, and Crestor.  Metoprolol held given relative hypotension.  Recent left heart cath in 09/2018 with CTO of the LAD that is medically managed.  No plans for ischemic evaluation at this time.  4. Acute on CKD stage  III: Check BMP today.  5. Hypertension: Since her hospital discharge with decreased salt consumption her blood pressure has been running on the soft side.  In this setting she has held her metoprolol since 5/22.  Her blood pressure is improved today at 128/78 however I do have concerns that her blood pressure may dip back down on the soft side moving forward with her dietary changes.  In this setting, we will continue to hold metoprolol for now.  She will remain on torsemide as outlined above with potential decrease in dose pending labs drawn today.  6. Hyperlipidemia: LDL of 25 from 01/2019.  Continue Crestor.  7. 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  I suspect some of this is multifactorial including her underlying cardiomyopathy with left bundle branch block, morbid obesity, physical deconditioning, and recent hospitalization.  She will keep her planned follow-up with EP to discuss this further next month.  Disposition: F/u with Dr. Rockey Situ or an APP in 2 months and EP as directed.  Current medicines are reviewed at length with the patient today.  The patient did not have any concerns regarding medicines.  Signed, Christell Faith, PA-C 03/04/2019 3:52 PM     [name] [place], [place] [phone]

## 2019-03-02 NOTE — Telephone Encounter (Signed)
Copied from [place] [id number]. Topic: Quick Communication - Home Health Verbal Orders >> Mar 02, 2019  3:37 PM Parke Poisson wrote: Caller/Agency: Cherly Anderson from [facility] Number: [phone] Requesting PT Frequency: 2 times a week for 2 weeks 1 time a week for 2 weeks

## 2019-03-02 NOTE — Telephone Encounter (Signed)
Called and gave verbal orders 

## 2019-03-02 NOTE — Telephone Encounter (Signed)
attempted to return call to Raquel Sarna, [place] with [facility]. NO answer. LMTCB.  Call attempted to pt at same time. NO answer. LMTCB

## 2019-03-02 NOTE — Telephone Encounter (Signed)
[facility] Physical Therapist called regarding Carvedilol being stopped and started Metoprolol. States Friday her BP was low 90/50. States her HR resting went last week from 65 to this week 94. Please call to discuss.

## 2019-03-03 NOTE — Telephone Encounter (Signed)
Spoke with the pt. Pt medication was switched from carvedilol to metoprolol succinate 50mg  daily after her recent hospitalization. Pt sts that he BP has been running low and her HR a little higher. BP yesterday 90/50 HR 94bpm BP this morning 103/60 92 bpm Pt sts that she is asymptomatic. She denies dizziness, lightheadedness, palpitations, sob.  Emliy, PT comes out to the pt home twice a week. Raquel Sarna was at the pt home yesterday. She called our office to give an update on the pt BP/HR. Pt sts that she was able to participate in PT with no issues. Iva, RN returned the call and lmtcb for both Raquel Sarna and the pt.  Reviewed the pt medications with her. She is taking torsemide 80mg  bid. She is suppose to be taking 78mEq bid of potassium, from the pt report it sounds as though she is only taking 21mEq of potassium at bed time, she is awaiting refills from her pcp. Adv the pt on the importance of taking potassium as prescribed.  Her weight have been stabled. Pt sts that she is on a fluid restriction on drinks less than a liter of fluid daily. She has an in office visit scheduled on 5/28 with Adria Devon, PA and a virtual sch will Dr. Fletcher Anon for 5/29 pt asked if she needs both appts (cancelled the virtual with Dr. Fletcher Anon). Adv the pt that I will fwd an update to [place]. We will call back with his recommendation. Pt verbalized understanding.

## 2019-03-03 NOTE — Telephone Encounter (Signed)
noted 

## 2019-03-03 NOTE — Telephone Encounter (Signed)
With relative hypotension and fluid restriction, we can decrease her Toprol XL to 25 mg daily. Her discharge instructions are somewhat unclear regarding KCl. For now, continue with KCl as she has been taking it and we will check a BMET when I see her in the office tomorrow.

## 2019-03-03 NOTE — Telephone Encounter (Signed)
Raquel Sarna returning call

## 2019-03-03 NOTE — Telephone Encounter (Signed)
Spoke with the pt. Pt made of Ryan's recommendation. Pt sts that she has not taken Metoprolol in a couple of days due to low BP. She did not disclose during our previous conversation. Pt sts that she will wait to her o/v with Thurmond Butts tomorrow to discuss resuming. Adv the pt that he plan seemed reasonable. I will fwd an FYI to [place]. Pt verbalized understanding and voiced appreciation for the call.

## 2019-03-04 ENCOUNTER — Other Ambulatory Visit
Admission: RE | Admit: 2019-03-04 | Discharge: 2019-03-04 | Disposition: A | Discharge status: Home / Self Care | Payer: PPO | Source: Ambulatory Visit | Attending: Physician Assistant | Admitting: Physician Assistant

## 2019-03-04 ENCOUNTER — Ambulatory Visit (INDEPENDENT_AMBULATORY_CARE_PROVIDER_SITE_OTHER): Payer: PPO | Admitting: Physician Assistant

## 2019-03-04 ENCOUNTER — Encounter: Payer: Self-pay | Admitting: Physician Assistant

## 2019-03-04 ENCOUNTER — Other Ambulatory Visit: Payer: Self-pay

## 2019-03-04 VITALS — BP 128/78 | HR 92 | Ht 68.0 in | Wt 284.0 lb

## 2019-03-04 DIAGNOSIS — I5042 Chronic combined systolic (congestive) and diastolic (congestive) heart failure: Secondary | ICD-10-CM

## 2019-03-04 DIAGNOSIS — N179 Acute kidney failure, unspecified: Secondary | ICD-10-CM

## 2019-03-04 DIAGNOSIS — I471 Supraventricular tachycardia: Secondary | ICD-10-CM

## 2019-03-04 DIAGNOSIS — I2510 Atherosclerotic heart disease of native coronary artery without angina pectoris: Secondary | ICD-10-CM

## 2019-03-04 DIAGNOSIS — N189 Chronic kidney disease, unspecified: Secondary | ICD-10-CM

## 2019-03-04 DIAGNOSIS — E785 Hyperlipidemia, unspecified: Secondary | ICD-10-CM | POA: Diagnosis not present

## 2019-03-04 DIAGNOSIS — Z87898 Personal history of other specified conditions: Secondary | ICD-10-CM

## 2019-03-04 LAB — BASIC METABOLIC PANEL
Anion gap: 13 (ref 5–15)
BUN: 90 mg/dL — ABNORMAL HIGH (ref 8–23)
CO2: 31 mmol/L (ref 22–32)
Calcium: 9.4 mg/dL (ref 8.9–10.3)
Chloride: 92 mmol/L — ABNORMAL LOW (ref 98–111)
Creatinine, Ser: 1.79 mg/dL — ABNORMAL HIGH (ref 0.44–1.00)
GFR calc Af Amer: 34 mL/min — ABNORMAL LOW (ref >60)
GFR calc non Af Amer: 29 mL/min — ABNORMAL LOW (ref >60)
Glucose, Bld: 136 mg/dL — ABNORMAL HIGH (ref 70–99)
Potassium: 3.4 mmol/L — ABNORMAL LOW (ref 3.5–5.1)
Sodium: 136 mmol/L (ref 135–145)

## 2019-03-04 NOTE — Telephone Encounter (Signed)
Patient has appt today with cards and sees CHF clinic on the 17th of June I have asked [facility] RN and patient to check in with me after those appts

## 2019-03-04 NOTE — Telephone Encounter (Signed)
Spoke with Rip Harbour, [facility] RN She has not seen patient this week as patient was going to see her family Last BP reading 122/80 Patient reported being lightheaded when BPs 90/60 Advised spreading BP meds through out day

## 2019-03-04 NOTE — Patient Instructions (Signed)
Medication Instructions:  Your physician has recommended you make the following change in your medication:  1- STOP Metoprolol  If you need a refill on your cardiac medications before your next appointment, please call your pharmacy.   Lab work: Your physician recommends that you return for lab work today [facility]) at [employer]. No appt is needed. Hours are M-F 7AM- 6 PM.  If you have labs (blood work) drawn today and your tests are completely normal, you will receive your results only by: Marland Kitchen MyChart Message (if you have MyChart) OR . A paper copy in the mail If you have any lab test that is abnormal or we need to change your treatment, we will call you to review the results.  Testing/Procedures: None ordered   Follow-Up: At [facility], you and your health needs are our priority.  As part of our continuing mission to provide you with exceptional heart care, we have created designated Provider Care Teams.  These Care Teams include your primary Cardiologist (physician) and Advanced Practice Providers (APPs -  Physician Assistants and Nurse Practitioners) who all work together to provide you with the care you need, when you need it. You will need a follow up appointment in 2 months.  You may see Ida Rogue, MD or Christell Faith, PA-C.

## 2019-03-05 ENCOUNTER — Telehealth: Payer: Self-pay | Admitting: Family Medicine

## 2019-03-05 ENCOUNTER — Telehealth: Payer: Self-pay | Admitting: Physician Assistant

## 2019-03-05 ENCOUNTER — Telehealth: Payer: PPO | Admitting: Cardiovascular Disease

## 2019-03-05 NOTE — Telephone Encounter (Signed)
Copied from [place] [id number]. Topic: Quick Communication - See Telephone Encounter >> Mar 05, 2019  1:41 PM Blase Mess A wrote: CRM for notification. See Telephone encounter for: 03/05/19.  Brayton El from [place] is calling to give an update on Blood Sugars Blood Sugars have not been below 200 running around 213-235.   Please advise with the patient. [phone] (M)

## 2019-03-05 NOTE — Telephone Encounter (Signed)
Attempted to call the patient. No answer- I left a message to please call back for results.

## 2019-03-05 NOTE — Telephone Encounter (Signed)
Notes recorded by Rise Mu, PA-C on 03/04/2019 at 5:07 PM EDT SCr is stable, though her BUN is trending up. Potassium is slightly low. Please have her decrease her Lasix to 40 mg bid. She should take her usual KCl 30 mEq at her next dose, then decrease this to 10 mEq bid. Please also see if she would be interested in the paramedicine service.

## 2019-03-09 ENCOUNTER — Other Ambulatory Visit: Payer: Self-pay

## 2019-03-09 ENCOUNTER — Inpatient Hospital Stay
Admission: EM | Admit: 2019-03-09 | Discharge: 2019-03-15 | DRG: 871 | Disposition: A | Discharge status: Home Health | Payer: PPO | Attending: Internal Medicine | Admitting: Internal Medicine

## 2019-03-09 ENCOUNTER — Emergency Department: Payer: PPO

## 2019-03-09 ENCOUNTER — Encounter: Payer: Self-pay | Admitting: Emergency Medicine

## 2019-03-09 DIAGNOSIS — I25118 Atherosclerotic heart disease of native coronary artery with other forms of angina pectoris: Secondary | ICD-10-CM | POA: Diagnosis present

## 2019-03-09 DIAGNOSIS — E871 Hypo-osmolality and hyponatremia: Secondary | ICD-10-CM | POA: Diagnosis not present

## 2019-03-09 DIAGNOSIS — Z8619 Personal history of other infectious and parasitic diseases: Secondary | ICD-10-CM

## 2019-03-09 DIAGNOSIS — Z66 Do not resuscitate: Secondary | ICD-10-CM | POA: Diagnosis present

## 2019-03-09 DIAGNOSIS — I5043 Acute on chronic combined systolic (congestive) and diastolic (congestive) heart failure: Secondary | ICD-10-CM | POA: Diagnosis not present

## 2019-03-09 DIAGNOSIS — I2510 Atherosclerotic heart disease of native coronary artery without angina pectoris: Secondary | ICD-10-CM | POA: Diagnosis not present

## 2019-03-09 DIAGNOSIS — I248 Other forms of acute ischemic heart disease: Secondary | ICD-10-CM | POA: Diagnosis present

## 2019-03-09 DIAGNOSIS — Z20828 Contact with and (suspected) exposure to other viral communicable diseases: Secondary | ICD-10-CM | POA: Diagnosis present

## 2019-03-09 DIAGNOSIS — E860 Dehydration: Secondary | ICD-10-CM | POA: Diagnosis present

## 2019-03-09 DIAGNOSIS — Z950 Presence of cardiac pacemaker: Secondary | ICD-10-CM

## 2019-03-09 DIAGNOSIS — N179 Acute kidney failure, unspecified: Secondary | ICD-10-CM | POA: Diagnosis not present

## 2019-03-09 DIAGNOSIS — E7800 Pure hypercholesterolemia, unspecified: Secondary | ICD-10-CM | POA: Diagnosis present

## 2019-03-09 DIAGNOSIS — N3000 Acute cystitis without hematuria: Secondary | ICD-10-CM | POA: Diagnosis not present

## 2019-03-09 DIAGNOSIS — E1122 Type 2 diabetes mellitus with diabetic chronic kidney disease: Secondary | ICD-10-CM | POA: Diagnosis not present

## 2019-03-09 DIAGNOSIS — Z825 Family history of asthma and other chronic lower respiratory diseases: Secondary | ICD-10-CM

## 2019-03-09 DIAGNOSIS — N183 Chronic kidney disease, stage 3 (moderate): Secondary | ICD-10-CM | POA: Diagnosis not present

## 2019-03-09 DIAGNOSIS — N390 Urinary tract infection, site not specified: Secondary | ICD-10-CM | POA: Diagnosis not present

## 2019-03-09 DIAGNOSIS — Z6841 Body Mass Index (BMI) 40.0 and over, adult: Secondary | ICD-10-CM

## 2019-03-09 DIAGNOSIS — E1140 Type 2 diabetes mellitus with diabetic neuropathy, unspecified: Secondary | ICD-10-CM | POA: Diagnosis not present

## 2019-03-09 DIAGNOSIS — J90 Pleural effusion, not elsewhere classified: Secondary | ICD-10-CM | POA: Diagnosis not present

## 2019-03-09 DIAGNOSIS — I480 Paroxysmal atrial fibrillation: Secondary | ICD-10-CM | POA: Diagnosis present

## 2019-03-09 DIAGNOSIS — Z90710 Acquired absence of both cervix and uterus: Secondary | ICD-10-CM

## 2019-03-09 DIAGNOSIS — A419 Sepsis, unspecified organism: Secondary | ICD-10-CM | POA: Diagnosis not present

## 2019-03-09 DIAGNOSIS — I10 Essential (primary) hypertension: Secondary | ICD-10-CM | POA: Diagnosis present

## 2019-03-09 DIAGNOSIS — G4733 Obstructive sleep apnea (adult) (pediatric): Secondary | ICD-10-CM | POA: Diagnosis not present

## 2019-03-09 DIAGNOSIS — Z794 Long term (current) use of insulin: Secondary | ICD-10-CM | POA: Diagnosis not present

## 2019-03-09 DIAGNOSIS — I428 Other cardiomyopathies: Secondary | ICD-10-CM | POA: Diagnosis present

## 2019-03-09 DIAGNOSIS — IMO0002 Reserved for concepts with insufficient information to code with codable children: Secondary | ICD-10-CM

## 2019-03-09 DIAGNOSIS — D631 Anemia in chronic kidney disease: Secondary | ICD-10-CM | POA: Diagnosis present

## 2019-03-09 DIAGNOSIS — Z79891 Long term (current) use of opiate analgesic: Secondary | ICD-10-CM

## 2019-03-09 DIAGNOSIS — E1129 Type 2 diabetes mellitus with other diabetic kidney complication: Secondary | ICD-10-CM

## 2019-03-09 DIAGNOSIS — I130 Hypertensive heart and chronic kidney disease with heart failure and stage 1 through stage 4 chronic kidney disease, or unspecified chronic kidney disease: Secondary | ICD-10-CM | POA: Diagnosis present

## 2019-03-09 DIAGNOSIS — R6520 Severe sepsis without septic shock: Secondary | ICD-10-CM | POA: Diagnosis present

## 2019-03-09 DIAGNOSIS — Z8249 Family history of ischemic heart disease and other diseases of the circulatory system: Secondary | ICD-10-CM

## 2019-03-09 DIAGNOSIS — N170 Acute kidney failure with tubular necrosis: Secondary | ICD-10-CM | POA: Diagnosis not present

## 2019-03-09 DIAGNOSIS — R531 Weakness: Secondary | ICD-10-CM | POA: Diagnosis present

## 2019-03-09 DIAGNOSIS — M7989 Other specified soft tissue disorders: Secondary | ICD-10-CM | POA: Diagnosis not present

## 2019-03-09 DIAGNOSIS — E1159 Type 2 diabetes mellitus with other circulatory complications: Secondary | ICD-10-CM | POA: Diagnosis not present

## 2019-03-09 DIAGNOSIS — I471 Supraventricular tachycardia: Secondary | ICD-10-CM | POA: Diagnosis not present

## 2019-03-09 DIAGNOSIS — A4151 Sepsis due to Escherichia coli [E. coli]: Principal | ICD-10-CM | POA: Diagnosis present

## 2019-03-09 DIAGNOSIS — Z89421 Acquired absence of other right toe(s): Secondary | ICD-10-CM

## 2019-03-09 DIAGNOSIS — G9340 Encephalopathy, unspecified: Secondary | ICD-10-CM | POA: Diagnosis not present

## 2019-03-09 DIAGNOSIS — Z9049 Acquired absence of other specified parts of digestive tract: Secondary | ICD-10-CM

## 2019-03-09 DIAGNOSIS — E119 Type 2 diabetes mellitus without complications: Secondary | ICD-10-CM

## 2019-03-09 DIAGNOSIS — E1169 Type 2 diabetes mellitus with other specified complication: Secondary | ICD-10-CM | POA: Diagnosis not present

## 2019-03-09 DIAGNOSIS — Z89422 Acquired absence of other left toe(s): Secondary | ICD-10-CM

## 2019-03-09 DIAGNOSIS — Z7982 Long term (current) use of aspirin: Secondary | ICD-10-CM

## 2019-03-09 DIAGNOSIS — Z8052 Family history of malignant neoplasm of bladder: Secondary | ICD-10-CM

## 2019-03-09 DIAGNOSIS — Z79899 Other long term (current) drug therapy: Secondary | ICD-10-CM

## 2019-03-09 DIAGNOSIS — Z8042 Family history of malignant neoplasm of prostate: Secondary | ICD-10-CM

## 2019-03-09 DIAGNOSIS — Z7902 Long term (current) use of antithrombotics/antiplatelets: Secondary | ICD-10-CM

## 2019-03-09 DIAGNOSIS — E785 Hyperlipidemia, unspecified: Secondary | ICD-10-CM | POA: Diagnosis present

## 2019-03-09 DIAGNOSIS — Z807 Family history of other malignant neoplasms of lymphoid, hematopoietic and related tissues: Secondary | ICD-10-CM

## 2019-03-09 DIAGNOSIS — F329 Major depressive disorder, single episode, unspecified: Secondary | ICD-10-CM | POA: Diagnosis not present

## 2019-03-09 DIAGNOSIS — I252 Old myocardial infarction: Secondary | ICD-10-CM | POA: Diagnosis not present

## 2019-03-09 DIAGNOSIS — Z833 Family history of diabetes mellitus: Secondary | ICD-10-CM

## 2019-03-09 DIAGNOSIS — I447 Left bundle-branch block, unspecified: Secondary | ICD-10-CM | POA: Diagnosis present

## 2019-03-09 DIAGNOSIS — Z8261 Family history of arthritis: Secondary | ICD-10-CM

## 2019-03-09 DIAGNOSIS — Z955 Presence of coronary angioplasty implant and graft: Secondary | ICD-10-CM

## 2019-03-09 LAB — POCT PREGNANCY, URINE: Preg Test, Ur: NEGATIVE

## 2019-03-09 LAB — URINALYSIS, ROUTINE W REFLEX MICROSCOPIC
Bilirubin Urine: NEGATIVE
Glucose, UA: NEGATIVE mg/dL
Ketones, ur: NEGATIVE mg/dL
Nitrite: NEGATIVE
Protein, ur: 100 mg/dL — AB
RBC / HPF: >50 RBC/hpf — ABNORMAL HIGH (ref 0–5)
Specific Gravity, Urine: 1.017 (ref 1.005–1.030)
WBC, UA: >50 WBC/hpf — ABNORMAL HIGH (ref 0–5)
pH: 5 (ref 5.0–8.0)

## 2019-03-09 LAB — CBC WITH DIFFERENTIAL/PLATELET
Abs Immature Granulocytes: 0.03 10*3/uL (ref 0.00–0.07)
Basophils Absolute: 0 10*3/uL (ref 0.0–0.1)
Basophils Relative: 0 %
Eosinophils Absolute: 0 10*3/uL (ref 0.0–0.5)
Eosinophils Relative: 0 %
HCT: 37.4 % (ref 36.0–46.0)
Hemoglobin: 12 g/dL (ref 12.0–15.0)
Immature Granulocytes: 0 %
Lymphocytes Relative: 10 %
Lymphs Abs: 0.9 10*3/uL (ref 0.7–4.0)
MCH: 27.3 pg (ref 26.0–34.0)
MCHC: 32.1 g/dL (ref 30.0–36.0)
MCV: 85.2 fL (ref 80.0–100.0)
Monocytes Absolute: 0.4 10*3/uL (ref 0.1–1.0)
Monocytes Relative: 5 %
Neutro Abs: 7.8 10*3/uL — ABNORMAL HIGH (ref 1.7–7.7)
Neutrophils Relative %: 85 %
Platelets: 234 10*3/uL (ref 150–400)
RBC: 4.39 MIL/uL (ref 3.87–5.11)
RDW: 14.8 % (ref 11.5–15.5)
WBC: 9.2 10*3/uL (ref 4.0–10.5)
nRBC: 0 % (ref 0.0–0.2)

## 2019-03-09 LAB — COMPREHENSIVE METABOLIC PANEL
ALT: 12 U/L (ref 0–44)
AST: 24 U/L (ref 15–41)
Albumin: 4 g/dL (ref 3.5–5.0)
Alkaline Phosphatase: 63 U/L (ref 38–126)
Anion gap: 17 — ABNORMAL HIGH (ref 5–15)
BUN: 78 mg/dL — ABNORMAL HIGH (ref 8–23)
CO2: 25 mmol/L (ref 22–32)
Calcium: 9.3 mg/dL (ref 8.9–10.3)
Chloride: 91 mmol/L — ABNORMAL LOW (ref 98–111)
Creatinine, Ser: 2.08 mg/dL — ABNORMAL HIGH (ref 0.44–1.00)
GFR calc Af Amer: 28 mL/min — ABNORMAL LOW (ref >60)
GFR calc non Af Amer: 24 mL/min — ABNORMAL LOW (ref >60)
Glucose, Bld: 256 mg/dL — ABNORMAL HIGH (ref 70–99)
Potassium: 3.8 mmol/L (ref 3.5–5.1)
Sodium: 133 mmol/L — ABNORMAL LOW (ref 135–145)
Total Bilirubin: 1.5 mg/dL — ABNORMAL HIGH (ref 0.3–1.2)
Total Protein: 7.7 g/dL (ref 6.5–8.1)

## 2019-03-09 LAB — TROPONIN I: Troponin I: 0.28 ng/mL (ref <0.03)

## 2019-03-09 LAB — LACTIC ACID, PLASMA: Lactic Acid, Venous: 3 mmol/L (ref 0.5–1.9)

## 2019-03-09 LAB — SARS CORONAVIRUS 2 BY RT PCR (HOSPITAL ORDER, PERFORMED IN ~~LOC~~ HOSPITAL LAB): SARS Coronavirus 2: NEGATIVE

## 2019-03-09 MED ORDER — TORSEMIDE 20 MG PO TABS: ORAL_TABLET | ORAL | Status: DC

## 2019-03-09 MED ORDER — METRONIDAZOLE IN NACL 5-0.79 MG/ML-% IV SOLN
500.0000 mg | Freq: Once | INTRAVENOUS | Status: AC
Start: 2019-03-09 — End: 2019-03-09
  Administered 2019-03-09: 500 mg via INTRAVENOUS
  Filled 2019-03-09: qty 100

## 2019-03-09 MED ORDER — VANCOMYCIN HCL 10 G IV SOLR
2500.0000 mg | Freq: Once | INTRAVENOUS | Status: AC
  Administered 2019-03-09: 2500 mg via INTRAVENOUS
  Filled 2019-03-09: qty 2500

## 2019-03-09 MED ORDER — VANCOMYCIN HCL IN DEXTROSE 1-5 GM/200ML-% IV SOLN
1000.0000 mg | Freq: Once | INTRAVENOUS | Status: DC
  Filled 2019-03-09: qty 200

## 2019-03-09 MED ORDER — SODIUM CHLORIDE 0.9 % IV SOLN
Freq: Once | INTRAVENOUS | Status: AC
  Administered 2019-03-09: 21:00:00 via INTRAVENOUS

## 2019-03-09 MED ORDER — SODIUM CHLORIDE 0.9 % IV SOLN
2.0000 g | Freq: Once | INTRAVENOUS | Status: AC
  Administered 2019-03-09: 21:00:00 2 g via INTRAVENOUS
  Filled 2019-03-09: qty 2

## 2019-03-09 NOTE — Telephone Encounter (Signed)
I spoke with the patient regarding her lab results.  I have also advised her of Christell Faith, PA's recommendations.  She states she is currently taking demadex (torsemide) 20 mg- 4 tablets (80 mg) twice daily.  She is also taking potassium 10 meq- 1 tablet (10 meq) once daily for the last 1.5 months.  Reviewed this with Christell Faith, PA. Orders received to: 1) Decrease torsemide 20 mg- take 2 tablets (40 mg) twice daily 2) Take an extra potassium 10 meq x 1 dose today, then resume 1 tablet (10 meq) by mouth once daily  The patient voices understanding of the above and is agreeable.  I have also asked her if she would be agreeable to a paramedicine referral and she states she would. She is aware we will work on arranging this for her.  The patient had no further questions.

## 2019-03-09 NOTE — ED Provider Notes (Signed)
[facility] Emergency Department Provider Note  Time seen: 9:05 PM  I have reviewed the triage vital signs and the nursing notes.   HISTORY  Chief Complaint Leg Swelling    HPI [name] is a 63 y.o. female with a past medical history of MI, CHF, anemia, CAD, neuropathy, obesity, recent sepsis due to infected pacemaker status post removal, presents to the emergency department for generalized weakness and fever.  According to the patient she has been very weak and nauseated today, was noted to have a fever to 104 at home per patient.  Patient denies any chest pain shortness of breath or cough.  Denies any abdominal pain or diarrhea.  No dysuria.   Past Medical History:  Diagnosis Date  . Acute myocardial infarction, subendocardial infarction, initial episode of care ([place]) 07/21/2012  . Acute osteomyelitis involving ankle and foot ([place]) 02/06/2015  . Acute systolic heart failure ([place]) 07/21/2012   New onset 07/19/12; admission to [facility] ED. Elevated Troponins.  S/p 2D-echo with EF 20-25%.  S/p cardiac catheterization with stenting LAD.  Repeat 2D-echo 10/2011 with improved EF of 35%.   . Anemia   . Automatic implantable cardioverter-defibrillator in situ    a. MDT CRT-D 06/2014, SN: [id number]  .removed in feb 2020  . CAD (coronary artery disease)    a. cardiac cath 101/04/2012: PCI/DES to chronically occluded mLAD, consideration PCI to diag branch in 4 weeks.   . Cataract   . Chicken pox   . Chronic kidney disease   . Chronic systolic CHF (congestive heart failure) (HCC)    a. mixed ICM & NICM; b. EF 20-25% by echo 07/2012, mid-dist 2/3 of LV sev HK/AK, mild MR. echo 10/2012: EF 30-35%, sev HK ant-septal & inf walls, GR1DD, mild MR, PASP 33. c. echo 02/2013: EF 30%, GR1DD, mild MR. echo 04/2014: EF 30%, Septal-lat dyssynchrony, global HK, inf AK, GR1DD, mild MR. d. echo 10/2014: EF50-55%, WM nl, GR1DD, septal mild paradox. e. echo 02/2015: EF 50-55%, wm   .  Depression   . Heart attack ([place])   . Heel ulcer ([place]) 04/27/2015  . History of blood transfusion ~ 2011   "plasma; had neuropathy; couldn't walk"  . Hypertension   . LBBB (left bundle branch block)   . Neuromuscular disorder ([place])   . Neuropathy 2011  . Obesity, unspecified   . OSA on CPAP    Moderate with AHI 23/hr and now on CPAP at 16cm H2O.  Her DME is [facility]  . Pure hypercholesterolemia   . Sepsis ([place]) 09/2018  . Type II diabetes mellitus ([place])   . Unspecified vitamin D deficiency     Patient Active Problem List   Diagnosis Date Noted  . Atrial tachycardia ([place])   . Acute kidney injury superimposed on chronic kidney disease ([place])   . AKI (acute kidney injury) ([place])   . Weakness generalized 12/22/2018  . Shortness of breath 12/22/2018  . Palliative care encounter 12/22/2018  . Endocarditis 12/14/2018  . NSTEMI (non-ST elevated myocardial infarction) ([place]) 11/22/2018  . Acute exacerbation of CHF (congestive heart failure) ([place]) 10/31/2018  . Acute on chronic combined systolic and diastolic CHF (congestive heart failure) ([place]) 10/28/2018  . Lymphedema 10/28/2018  . History of amputation of lesser toe of right foot ([place]) 04/19/2018  . 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 Paraparesis of both lower limbs ([place]) 03/29/2018  . 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 Paroxysmal atrial fibrillation ([place]) 03/29/2018  . Class 3 obesity  due to excess calories with serious comorbidity and body mass index (BMI) of 40.0 to 44.9 in adult 09/02/2016  . History of CVA (cerebrovascular accident) 11/11/2015  . Chronic renal insufficiency 08/04/2015  . OSA (obstructive sleep apnea) 08/15/2014  . Morbid obesity ([place]) 10/26/2013  . Essential hypertension, benign 04/29/2013  . Pure hypercholesterolemia 12/07/2012  . Iron deficiency anemia 12/07/2012  . Diabetic peripheral neuropathy associated with type 2 diabetes mellitus ([place]) 12/07/2012  . Chronic systolic  congestive heart failure ([place]) 07/29/2012  . Left bundle-branch block 07/25/2012  . Coronary artery disease involving native coronary artery of native heart without angina pectoris 07/25/2012    Past Surgical History:  Procedure Laterality Date  . AMPUTATION TOE Right 06/18/2015   Procedure: AMPUTATION TOE;  Surgeon: Samara Deist, DPM;  Location: [facility] ORS;  Service: Podiatry;  Laterality: Right;  . AMPUTATION TOE Left 10/08/2018   Procedure: AMPUTATION TOE LEFT 2ND;  Surgeon: Albertine Patricia, DPM;  Location: [facility] ORS;  Service: Podiatry;  Laterality: Left;  . BACK SURGERY    . BI-VENTRICULAR IMPLANTABLE CARDIOVERTER DEFIBRILLATOR N/A 07/06/2014   Procedure: BI-VENTRICULAR IMPLANTABLE CARDIOVERTER DEFIBRILLATOR  (CRT-D);  Surgeon: Deboraha Sprang, MD;  Location: [facility] CATH LAB;  Service: Cardiovascular;  Laterality: N/A;  . BI-VENTRICULAR IMPLANTABLE CARDIOVERTER DEFIBRILLATOR  (CRT-D)  07/06/2014  . BILATERAL OOPHORECTOMY  01/2011   ovarian cyst benign  . COLONOSCOPY WITH PROPOFOL Left 02/22/2015   Procedure: COLONOSCOPY WITH PROPOFOL;  Surgeon: Hulen Luster, MD;  Location: [facility] ENDOSCOPY;  Service: Endoscopy;  Laterality: Left;  . CORONARY ANGIOPLASTY WITH STENT PLACEMENT Left 07/2012   new onset systolic CHF; elevated troponins.  Cardiac catheterization with stenting to LAD; EF 15%.  2D-echo: EF 20-25%.  . ESOPHAGOGASTRODUODENOSCOPY N/A 02/22/2015   Procedure: ESOPHAGOGASTRODUODENOSCOPY (EGD);  Surgeon: Hulen Luster, MD;  Location: [facility] ENDOSCOPY;  Service: Endoscopy;  Laterality: N/A;  . INCISION AND DRAINAGE ABSCESS Right 2007   groin; with ICU stay due to sepsis.  Marland Kitchen LAPAROSCOPIC CHOLECYSTECTOMY  2011  . LEFT HEART CATH AND CORONARY ANGIOGRAPHY N/A 10/05/2018   Procedure: LEFT HEART CATH AND CORONARY ANGIOGRAPHY;  Surgeon: Minna Merritts, MD;  Location: [place] CV LAB;  Service: Cardiovascular;  Laterality: N/A;  . LEFT HEART CATHETERIZATION WITH CORONARY ANGIOGRAM N/A 07/21/2012    Procedure: LEFT HEART CATHETERIZATION WITH CORONARY ANGIOGRAM;  Surgeon: Jolaine Artist, MD;  Location: [facility] CATH LAB;  Service: Cardiovascular;  Laterality: N/A;  . [place]   L4-5  . PACEMAKER REMOVAL  11/2018   due to infection around pacemaker  . PERCUTANEOUS CORONARY STENT INTERVENTION (PCI-S) N/A 07/23/2012   Procedure: PERCUTANEOUS CORONARY STENT INTERVENTION (PCI-S);  Surgeon: Sherren Mocha, MD;  Location: [facility] CATH LAB;  Service: Cardiovascular;  Laterality: N/A;  . PERIPHERAL VASCULAR BALLOON ANGIOPLASTY Left 10/06/2018   Procedure: PERIPHERAL VASCULAR BALLOON ANGIOPLASTY;  Surgeon: Katha Cabal, MD;  Location: [place] CV LAB;  Service: Cardiovascular;  Laterality: Left;  . PERIPHERAL VASCULAR CATHETERIZATION N/A 02/10/2015   Procedure: Picc Line Insertion;  Surgeon: Katha Cabal, MD;  Location: [place] CV LAB;  Service: Cardiovascular;  Laterality: N/A;  . TEE WITHOUT CARDIOVERSION N/A 10/02/2018   Procedure: TRANSESOPHAGEAL ECHOCARDIOGRAM (TEE);  Surgeon: Wellington Hampshire, MD;  Location: [facility] ORS;  Service: Cardiovascular;  Laterality: N/A;  . [place]  . VAGINAL HYSTERECTOMY  01/2011   Fibroids/DUB.  Ovaries removed. [place].    Prior to Admission medications   Medication Sig Start Date End Date Taking? Authorizing  Provider  aspirin EC 81 MG EC tablet Take 1 tablet (81 mg total) by mouth daily. 11/24/18   Epifanio Lesches, MD  Calcium Carbonate-Vitamin D3 (CALCIUM 600-D) 600-400 MG-UNIT TABS Take 1 tablet by mouth 2 (two) times a day.    [provider]  clopidogrel (PLAVIX) 75 MG tablet Take 1 tablet (75 mg total) by mouth daily. 12/24/18   Rutherford Guys, MD  ferrous sulfate 325 (65 FE) MG tablet Take 325 mg by mouth 2 (two) times daily with a meal.    [provider]  gabapentin (NEURONTIN) 600 MG tablet Take 1 tablet (600 mg total) by mouth 2 (two) times daily. Takes 1 in the morning and two at  bedtime Patient taking differently: Take 600 mg by mouth 2 (two) times daily. Take 1 tablet (600mg ) by mouth every morning and take 2 tablets (1200mg ) by mouth at bedtime 12/24/18   Rutherford Guys, MD  glimepiride (AMARYL) 4 MG tablet Take 4 mg by mouth 2 (two) times daily.    [provider]  glucose blood (ONE TOUCH ULTRA TEST) test strip USE AS DIRECTED TO CHECK BLOOD SUGAR 3 TIMES DAILY 01/09/19   Rutherford Guys, MD  insulin glargine (LANTUS) 100 UNIT/ML injection Inject 0.16 mLs (16 Units total) into the skin at bedtime. Patient taking differently: Inject 14 Units into the skin at bedtime.  11/24/18   Epifanio Lesches, MD  Multiple Vitamin (MULTIVITAMIN) tablet Take 1 tablet by mouth daily.    [provider]  Omega-3 Fatty Acids (FISH OIL) 1000 MG CAPS Take 1 capsule by mouth 2 (two) times a day.     [provider]  potassium chloride (K-DUR) 10 MEQ tablet Take 1 tablet (10 meq) by mouth once daily    [provider]  rosuvastatin (CRESTOR) 40 MG tablet TAKE 1 TABLET BY MOUTH ONCE DAILY Patient taking differently: Take 40 mg by mouth daily.  02/08/19   Wellington Hampshire, MD  torsemide (DEMADEX) 20 MG tablet Take 2 tablets (40 mg) by mouth twice daily 03/09/19   Rise Mu, PA-C  traMADol (ULTRAM) 50 MG tablet Take 1 tablet (50 mg total) by mouth every 6 (six) hours as needed. 01/07/19   Rutherford Guys, MD    No Known Allergies  Family History  Problem Relation Age of Onset  . Diabetes Mother   . Hypertension Mother   . Arthritis Mother        knees, lumbar DDD, cervical DDD  . Cancer Father        prostate,skin,lymphoma.  . Cancer Brother 40       bladder cancer; non-smoker  . Diabetes Maternal Grandmother   . Heart disease Maternal Grandmother   . COPD Maternal Grandmother   . Diabetes Paternal Grandmother   . Obesity Brother   . Diabetes Son   . Hypertension Son   . Cancer Maternal Grandfather   . Diabetes Paternal Grandfather      Social History Social History   Tobacco Use  . Smoking status: Never Smoker  . Smokeless tobacco: Never Used  Substance Use Topics  . Alcohol use: No    Alcohol/week: 0.0 standard drinks  . Drug use: No    Review of Systems Constitutional: Positive for fever at home today per patient.  Positive for generalized weakness. ENT: Negative for recent illness/congestion Cardiovascular: Negative for chest pain. Respiratory: Negative for shortness of breath. Gastrointestinal: Negative for abdominal pain.  Positive for nausea. Genitourinary: Negative for urinary  compaints Musculoskeletal: Negative for musculoskeletal complaints Skin: Negative for skin complaints  Neurological: Negative for headache All other ROS negative  ____________________________________________   PHYSICAL EXAM:  VITAL SIGNS: ED Triage Vitals  Enc Vitals Group     BP 03/09/19 2029 122/75     Pulse Rate 03/09/19 2029 (!) 127     Resp 03/09/19 2029 20     Temp 03/09/19 2029 97.8 F (36.6 C)     Temp Source 03/09/19 2029 Oral     SpO2 03/09/19 2029 98 %     Weight 03/09/19 2031 (!) 363 lb (164.7 kg)     Height 03/09/19 2031 5\' 8"  (1.727 m)     Head Circumference --      Peak Flow --      Pain Score 03/09/19 2030 0     Pain Loc --      Pain Edu? --      Excl. in [place]? --     Constitutional: Alert and oriented. Well appearing and in no distress. Eyes: Normal exam ENT      Head: Normocephalic and atraumatic.      Mouth/Throat: Mucous membranes are moist. Cardiovascular: Normal rate, regular rhythm. Respiratory: Normal respiratory effort without tachypnea nor retractions. Breath sounds are clear  Gastrointestinal: Soft and nontender. No distention. Musculoskeletal: Nontender with normal range of motion in all extremities.  Neurologic:  Normal speech and language. No gross focal neurologic deficits  Skin:  Skin is warm, dry and intact.  Psychiatric: Mood and affect are normal.    ____________________________________________    EKG  EKG viewed and interpreted by myself shows rhythm around 110 bpm with a widened QRS, most consistent with left bundle branch block nonspecific ST changes.  ____________________________________________    RADIOLOGY  Chest x-ray appears largely unchanged from prior chest x-ray with opacity in the right middle lobe, possible increased haziness or edema.  ____________________________________________   INITIAL IMPRESSION / ASSESSMENT AND PLAN / ED COURSE  Pertinent labs & imaging results that were available during my care of the patient were reviewed by me and considered in my medical decision making (see chart for details).   Patient presents to the emergency department for generalized weakness, fever and nausea at home.  Patient states a recent history of sepsis following her pacemaker removal which was found to be infected per patient.  Differential at this time would include sepsis, urinary tract infection, pneumonia, bacteremia, electrolyte or metabolic abnormality, dehydration.  We will check labs, cultures, start on broad-spectrum antibiotics, IV hydrate while awaiting lab results.  We will also check for coronavirus.  We will obtain a chest x-ray and continue to closely monitor.  Patient agreeable to plan of care.  Possible mild edema on chest x-ray we will slow IV fluids.  Patient's urinalysis has resulted showing urinary tract infection we will continue with IV antibiotics.  Patient's troponin 0 0.28.  Patient remains tachycardic very likely could be demand ischemia related.  [name] was evaluated in Emergency Department on 03/09/2019 for the symptoms described in the history of present illness. She was evaluated in the context of the global COVID-19 pandemic, which necessitated consideration that the patient might be at risk for infection with the SARS-CoV-2 virus that causes COVID-19. Institutional protocols and  algorithms that pertain to the evaluation of patients at risk for COVID-19 are in a state of rapid change based on information released by regulatory bodies including the CDC and federal and state organizations. These policies and algorithms were  followed during the patient's care in the ED.  CRITICAL CARE Performed by: Harvest Dark   Total critical care time: 30 minutes  Critical care time was exclusive of separately billable procedures and treating other patients.  Critical care was necessary to treat or prevent imminent or life-threatening deterioration.  Critical care was time spent personally by me on the following activities: development of treatment plan with patient and/or surrogate as well as nursing, discussions with consultants, evaluation of patient's response to treatment, examination of patient, obtaining history from patient or surrogate, ordering and performing treatments and interventions, ordering and review of laboratory studies, ordering and review of radiographic studies, pulse oximetry and re-evaluation of patient's condition.   ____________________________________________   FINAL CLINICAL IMPRESSION(S) / ED DIAGNOSES  Sepsis Urinary tract infection   Harvest Dark, MD 03/09/19 2247

## 2019-03-09 NOTE — ED Notes (Signed)
Fluid decreased to 534ml/hr due to CHF hx.

## 2019-03-09 NOTE — ED Notes (Signed)
Pt daughter called. Daughter updated on pt status.

## 2019-03-09 NOTE — Progress Notes (Signed)
CODE SEPSIS - PHARMACY COMMUNICATION  **Broad Spectrum Antibiotics should be administered within 1 hour of Sepsis diagnosis**  Time Code Sepsis Called/Page Received:   6/2 @ 2051   Antibiotics Ordered: Vancomycin , Cefepime   Time of 1st antibiotic administration: Cefepime on 6/2 @ 2112   Additional action taken by pharmacy:   If necessary, Name of Provider/Nurse Contacted:     Aarna Mihalko D ,PharmD Clinical Pharmacist  03/09/2019  9:31 PM

## 2019-03-09 NOTE — H&P (Signed)
[name] NAME: [name]    MR#:  [id number]  DATE OF BIRTH:  08/25/1956  DATE OF ADMISSION:  03/09/2019  PRIMARY CARE PHYSICIAN: Rutherford Guys, MD   REQUESTING/REFERRING PHYSICIAN: Kerman Passey, MD  CHIEF COMPLAINT:   Chief Complaint  Patient presents with  . Leg Swelling    HISTORY OF PRESENT ILLNESS:  [name]  is a 63 y.o. female who presents with chief complaint as above.  Patient has had multiple admissions to our hospital recently for infection and pacemaker complication.  She presents to the ED again today with a complaint of shortness of breath and leg swelling.  She is noted here to meet sepsis criteria and have a UTI.  She also has an elevated troponin and some edema in her lungs.  Hospitalist called for admission  PAST MEDICAL HISTORY:   Past Medical History:  Diagnosis Date  . Acute myocardial infarction, subendocardial infarction, initial episode of care ([place]) 07/21/2012  . Acute osteomyelitis involving ankle and foot ([place]) 02/06/2015  . Acute systolic heart failure ([place]) 07/21/2012   New onset 07/19/12; admission to [facility] ED. Elevated Troponins.  S/p 2D-echo with EF 20-25%.  S/p cardiac catheterization with stenting LAD.  Repeat 2D-echo 10/2011 with improved EF of 35%.   . Anemia   . Automatic implantable cardioverter-defibrillator in situ    a. MDT CRT-D 06/2014, SN: [id number]  .removed in feb 2020  . CAD (coronary artery disease)    a. cardiac cath 101/04/2012: PCI/DES to chronically occluded mLAD, consideration PCI to diag branch in 4 weeks.   . Cataract   . Chicken pox   . Chronic kidney disease   . Chronic systolic CHF (congestive heart failure) (HCC)    a. mixed ICM & NICM; b. EF 20-25% by echo 07/2012, mid-dist 2/3 of LV sev HK/AK, mild MR. echo 10/2012: EF 30-35%, sev HK ant-septal & inf walls, GR1DD, mild MR, PASP 33. c. echo 02/2013: EF 30%, GR1DD, mild MR. echo 04/2014: EF 30%,  Septal-lat dyssynchrony, global HK, inf AK, GR1DD, mild MR. d. echo 10/2014: EF50-55%, WM nl, GR1DD, septal mild paradox. e. echo 02/2015: EF 50-55%, wm   . Depression   . Heart attack ([place])   . Heel ulcer ([place]) 04/27/2015  . History of blood transfusion ~ 2011   "plasma; had neuropathy; couldn't walk"  . Hypertension   . LBBB (left bundle branch block)   . Neuromuscular disorder ([place])   . Neuropathy 2011  . Obesity, unspecified   . OSA on CPAP    Moderate with AHI 23/hr and now on CPAP at 16cm H2O.  Her DME is [facility]  . Pure hypercholesterolemia   . Sepsis ([place]) 09/2018  . Type II diabetes mellitus ([place])   . Unspecified vitamin D deficiency      PAST SURGICAL HISTORY:   Past Surgical History:  Procedure Laterality Date  . AMPUTATION TOE Right 06/18/2015   Procedure: AMPUTATION TOE;  Surgeon: Samara Deist, DPM;  Location: [facility] ORS;  Service: Podiatry;  Laterality: Right;  . AMPUTATION TOE Left 10/08/2018   Procedure: AMPUTATION TOE LEFT 2ND;  Surgeon: Albertine Patricia, DPM;  Location: [facility] ORS;  Service: Podiatry;  Laterality: Left;  . BACK SURGERY    . BI-VENTRICULAR IMPLANTABLE CARDIOVERTER DEFIBRILLATOR N/A 07/06/2014   Procedure: BI-VENTRICULAR IMPLANTABLE CARDIOVERTER DEFIBRILLATOR  (CRT-D);  Surgeon: Deboraha Sprang, MD;  Location: [facility] CATH LAB;  Service: Cardiovascular;  Laterality: N/A;  . BI-VENTRICULAR IMPLANTABLE  CARDIOVERTER DEFIBRILLATOR  (CRT-D)  07/06/2014  . BILATERAL OOPHORECTOMY  01/2011   ovarian cyst benign  . COLONOSCOPY WITH PROPOFOL Left 02/22/2015   Procedure: COLONOSCOPY WITH PROPOFOL;  Surgeon: Hulen Luster, MD;  Location: [facility] ENDOSCOPY;  Service: Endoscopy;  Laterality: Left;  . CORONARY ANGIOPLASTY WITH STENT PLACEMENT Left 07/2012   new onset systolic CHF; elevated troponins.  Cardiac catheterization with stenting to LAD; EF 15%.  2D-echo: EF 20-25%.  . ESOPHAGOGASTRODUODENOSCOPY N/A 02/22/2015   Procedure: ESOPHAGOGASTRODUODENOSCOPY (EGD);  Surgeon: Hulen Luster, MD;   Location: [facility] ENDOSCOPY;  Service: Endoscopy;  Laterality: N/A;  . INCISION AND DRAINAGE ABSCESS Right 2007   groin; with ICU stay due to sepsis.  Marland Kitchen LAPAROSCOPIC CHOLECYSTECTOMY  2011  . LEFT HEART CATH AND CORONARY ANGIOGRAPHY N/A 10/05/2018   Procedure: LEFT HEART CATH AND CORONARY ANGIOGRAPHY;  Surgeon: Minna Merritts, MD;  Location: [place] CV LAB;  Service: Cardiovascular;  Laterality: N/A;  . LEFT HEART CATHETERIZATION WITH CORONARY ANGIOGRAM N/A 07/21/2012   Procedure: LEFT HEART CATHETERIZATION WITH CORONARY ANGIOGRAM;  Surgeon: Jolaine Artist, MD;  Location: [facility] CATH LAB;  Service: Cardiovascular;  Laterality: N/A;  . [place]   L4-5  . PACEMAKER REMOVAL  11/2018   due to infection around pacemaker  . PERCUTANEOUS CORONARY STENT INTERVENTION (PCI-S) N/A 07/23/2012   Procedure: PERCUTANEOUS CORONARY STENT INTERVENTION (PCI-S);  Surgeon: Sherren Mocha, MD;  Location: [facility] CATH LAB;  Service: Cardiovascular;  Laterality: N/A;  . PERIPHERAL VASCULAR BALLOON ANGIOPLASTY Left 10/06/2018   Procedure: PERIPHERAL VASCULAR BALLOON ANGIOPLASTY;  Surgeon: Katha Cabal, MD;  Location: [place] CV LAB;  Service: Cardiovascular;  Laterality: Left;  . PERIPHERAL VASCULAR CATHETERIZATION N/A 02/10/2015   Procedure: Picc Line Insertion;  Surgeon: Katha Cabal, MD;  Location: [place] CV LAB;  Service: Cardiovascular;  Laterality: N/A;  . TEE WITHOUT CARDIOVERSION N/A 10/02/2018   Procedure: TRANSESOPHAGEAL ECHOCARDIOGRAM (TEE);  Surgeon: Wellington Hampshire, MD;  Location: [facility] ORS;  Service: Cardiovascular;  Laterality: N/A;  . [place]  . VAGINAL HYSTERECTOMY  01/2011   Fibroids/DUB.  Ovaries removed. [place].     SOCIAL HISTORY:   Social History   Tobacco Use  . Smoking status: Never Smoker  . Smokeless tobacco: Never Used  Substance Use Topics  . Alcohol use: No    Alcohol/week: 0.0 standard drinks     FAMILY HISTORY:    Family History  Problem Relation Age of Onset  . Diabetes Mother   . Hypertension Mother   . Arthritis Mother        knees, lumbar DDD, cervical DDD  . Cancer Father        prostate,skin,lymphoma.  . Cancer Brother 100       bladder cancer; non-smoker  . Diabetes Maternal Grandmother   . Heart disease Maternal Grandmother   . COPD Maternal Grandmother   . Diabetes Paternal Grandmother   . Obesity Brother   . Diabetes Son   . Hypertension Son   . Cancer Maternal Grandfather   . Diabetes Paternal Grandfather      DRUG ALLERGIES:  No Known Allergies  MEDICATIONS AT HOME:   Prior to Admission medications   Medication Sig Start Date End Date Taking? Authorizing Provider  aspirin EC 81 MG EC tablet Take 1 tablet (81 mg total) by mouth daily. 11/24/18   Epifanio Lesches, MD  Calcium Carbonate-Vitamin D3 (CALCIUM 600-D) 600-400 MG-UNIT TABS Take 1 tablet by mouth 2 (two) times  a day.    [provider]  clopidogrel (PLAVIX) 75 MG tablet Take 1 tablet (75 mg total) by mouth daily. 12/24/18   Rutherford Guys, MD  ferrous sulfate 325 (65 FE) MG tablet Take 325 mg by mouth 2 (two) times daily with a meal.    [provider]  gabapentin (NEURONTIN) 600 MG tablet Take 1 tablet (600 mg total) by mouth 2 (two) times daily. Takes 1 in the morning and two at bedtime Patient taking differently: Take 600 mg by mouth 2 (two) times daily. Take 1 tablet (600mg ) by mouth every morning and take 2 tablets (1200mg ) by mouth at bedtime 12/24/18   Rutherford Guys, MD  glimepiride (AMARYL) 4 MG tablet Take 4 mg by mouth 2 (two) times daily.    [provider]  glucose blood (ONE TOUCH ULTRA TEST) test strip USE AS DIRECTED TO CHECK BLOOD SUGAR 3 TIMES DAILY 01/09/19   Rutherford Guys, MD  insulin glargine (LANTUS) 100 UNIT/ML injection Inject 0.16 mLs (16 Units total) into the skin at bedtime. Patient taking differently: Inject 14 Units into the skin at bedtime.  11/24/18    Epifanio Lesches, MD  Multiple Vitamin (MULTIVITAMIN) tablet Take 1 tablet by mouth daily.    [provider]  Omega-3 Fatty Acids (FISH OIL) 1000 MG CAPS Take 1 capsule by mouth 2 (two) times a day.     [provider]  potassium chloride (K-DUR) 10 MEQ tablet Take 1 tablet (10 meq) by mouth once daily    [provider]  rosuvastatin (CRESTOR) 40 MG tablet TAKE 1 TABLET BY MOUTH ONCE DAILY Patient taking differently: Take 40 mg by mouth daily.  02/08/19   Wellington Hampshire, MD  torsemide (DEMADEX) 20 MG tablet Take 2 tablets (40 mg) by mouth twice daily 03/09/19   Rise Mu, PA-C  traMADol (ULTRAM) 50 MG tablet Take 1 tablet (50 mg total) by mouth every 6 (six) hours as needed. 01/07/19   Rutherford Guys, MD    REVIEW OF SYSTEMS:  Review of Systems  Constitutional: Positive for malaise/fatigue. Negative for chills, fever and weight loss.  HENT: Negative for ear pain, hearing loss and tinnitus.   Eyes: Negative for blurred vision, double vision, pain and redness.  Respiratory: Positive for shortness of breath. Negative for cough and hemoptysis.   Cardiovascular: Positive for leg swelling. Negative for chest pain, palpitations and orthopnea.  Gastrointestinal: Negative for abdominal pain, constipation, diarrhea, nausea and vomiting.  Genitourinary: Negative for dysuria, frequency and hematuria.  Musculoskeletal: Negative for back pain, joint pain and neck pain.  Skin:       No acne, rash, or lesions  Neurological: Negative for dizziness, tremors, focal weakness and weakness.  Endo/Heme/Allergies: Negative for polydipsia. Does not bruise/bleed easily.  Psychiatric/Behavioral: Negative for depression. The patient is not nervous/anxious and does not have insomnia.      VITAL SIGNS:   Vitals:   03/09/19 2200 03/09/19 2210 03/09/19 2230 03/09/19 2300  BP: 123/69 128/68 111/77 121/75  Pulse:  (!) 120    Resp: (!) 25 (!) 26 18 (!) 27  Temp:      TempSrc:       SpO2:  97%    Weight:      Height:       Wt Readings from Last 3 Encounters:  03/09/19 (!) 164.7 kg  03/04/19 128.8 kg  02/25/19 133.4 kg    PHYSICAL EXAMINATION:  Physical Exam  Vitals reviewed. Constitutional:  She is oriented to person, place, and time. She appears well-developed and well-nourished. No distress.  HENT:  Head: Normocephalic and atraumatic.  Mouth/Throat: Oropharynx is clear and moist.  Eyes: Pupils are equal, round, and reactive to light. Conjunctivae and EOM are normal. No scleral icterus.  Neck: Normal range of motion. Neck supple. No JVD present. No thyromegaly present.  Cardiovascular: Regular rhythm and intact distal pulses. Exam reveals no gallop and no friction rub.  No murmur heard. tachycardic  Respiratory: Effort normal. No respiratory distress. She has no wheezes. She has rales.  GI: Soft. Bowel sounds are normal. She exhibits no distension. There is no abdominal tenderness.  Musculoskeletal: Normal range of motion.        General: Edema present.     Comments: No arthritis, no gout  Lymphadenopathy:    She has no cervical adenopathy.  Neurological: She is alert and oriented to person, place, and time. No cranial nerve deficit.  No dysarthria, no aphasia  Skin: Skin is warm and dry. No rash noted. No erythema.  Psychiatric: She has a normal mood and affect. Her behavior is normal. Judgment and thought content normal.    LABORATORY PANEL:   CBC Recent Labs  Lab 03/09/19 2054  WBC 9.2  HGB 12.0  HCT 37.4  PLT 234   ------------------------------------------------------------------------------------------------------------------  Chemistries  Recent Labs  Lab 03/09/19 2054  NA 133*  K 3.8  CL 91*  CO2 25  GLUCOSE 256*  BUN 78*  CREATININE 2.08*  CALCIUM 9.3  AST 24  ALT 12  ALKPHOS 63  BILITOT 1.5*   ------------------------------------------------------------------------------------------------------------------  Cardiac  Enzymes Recent Labs  Lab 03/09/19 2054  TROPONINI 0.28*   ------------------------------------------------------------------------------------------------------------------  RADIOLOGY:  No results found.  EKG:   Orders placed or performed during the hospital encounter of 03/09/19  . ED EKG  . ED EKG  . ED EKG 12-Lead  . ED EKG 12-Lead  . EKG 12-Lead  . EKG 12-Lead    IMPRESSION AND PLAN:  Principal Problem:   Severe sepsis ([place]) -due to UTI, lactic acid is elevated at 3, however given her edema in her lungs and her heart failure we did not give her a significant amount of fluids.  Repeat lactic was down to 2.1.  We will continue to trend lactic acid.  IV antibiotics given.  Cultures sent from the ED Active Problems:   Acute on chronic combined systolic and diastolic CHF (congestive heart failure) ([place]) -given her elevated lactic acid we did not proceed with diuresis.  The pulmonary edema seen on her chest x-ray was comparable to prior edema.  She does have some rales on exam, but again given her lactate as above we did not diurese.  However, we also did not provide aggressive fluids.  Continue home meds, monitor closely.  She does also have an elevated troponin which I feel is likely due to demand ischemia.  However we will get a cardiology consult   UTI (urinary tract infection) -IV antibiotics and culture as above   CAD (coronary artery disease) -continue home meds, other work-up as above   Essential hypertension, benign -home dose antihypertensives   OSA (obstructive sleep apnea) -BiPAP nightly   Paroxysmal atrial fibrillation ([place]) -continue home meds   Diabetes ([place]) -sliding scale insulin   Pure hypercholesterolemia -home dose antilipid  Chart review performed and case discussed with ED provider. Labs, imaging and/or ECG reviewed by provider and discussed with patient/family. Management plans discussed with the patient and/or family.  COVID-19 status: Test pending  DVT  PROPHYLAXIS: SubQ heparin  GI PROPHYLAXIS:  None  ADMISSION STATUS: Inpatient     CODE STATUS: DNR Code Status History    Date Active Date Inactive Code Status Order ID Comments User Context   02/16/2019 1543 02/23/2019 1702 DNR [id number]  Fritzi Mandes, MD Inpatient   12/14/2018 1812 12/16/2018 1821 Full Code [id number]  Saundra Shelling, MD Inpatient   11/22/2018 1543 11/29/2018 0519 Full Code [id number]  Salary, Avel Peace, MD Inpatient   11/22/2018 1543 11/22/2018 1543 Full Code [id number]  Salary, Avel Peace, MD Inpatient   10/31/2018 0232 11/04/2018 1826 Full Code [id number]  Lance Coon, MD Inpatient   10/13/2018 0047 10/16/2018 1900 DNR [id number]  Salary, Avel Peace, MD Inpatient   09/28/2018 1456 10/09/2018 1726 Full Code [id number]  Nicholes Mango, MD ED   11/11/2015 1411 11/12/2015 1635 Full Code [id number]  Vaughan Basta, MD Inpatient   06/17/2015 0148 06/19/2015 1659 Full Code [id number]  Harrie Foreman, MD Inpatient   02/17/2015 1330 02/24/2015 1925 DNR [id number]  Bettey Costa, MD ED   07/06/2014 1100 07/07/2014 1524 Full Code [id number]  Deboraha Sprang, MD Inpatient   07/22/2012 1427 07/26/2012 1507 DNR [id number]  Nolon Rod, DO Inpatient    Questions for Most Recent Historical Code Status (Order [id number])    Question Answer Comment   In the event of cardiac or respiratory ARREST Do not call a "code blue"    In the event of cardiac or respiratory ARREST Do not perform Intubation, CPR, defibrillation or ACLS    In the event of cardiac or respiratory ARREST Use medication by any route, position, wound care, and other measures to relive pain and suffering. May use oxygen, suction and manual treatment of airway obstruction as needed for comfort.         Advance Directive Documentation     Most Recent Value  Type of Advance Directive  Healthcare Power of Attorney, Living will  Pre-existing out of facility DNR order (yellow form or pink MOST form)  -  "MOST" Form in Place?  -      TOTAL TIME  TAKING CARE OF THIS PATIENT: 45 minutes.   This patient was evaluated in the context of the global COVID-19 pandemic, which necessitated consideration that the patient might be at risk for infection with the SARS-CoV-2 virus that causes COVID-19. Institutional protocols and algorithms that pertain to the evaluation of patients at risk for COVID-19 are in a state of rapid change based on information released by regulatory bodies including the CDC and federal and state organizations. These policies and algorithms were followed to the best of this provider's knowledge to date during the patient's care at this facility.  Ethlyn Daniels 03/09/2019, 11:26 PM  [facility]  Office  [phone]  CC: Primary care physician; Rutherford Guys, MD  Note:  This document was prepared using Dragon voice recognition software and may include unintentional dictation errors.

## 2019-03-09 NOTE — ED Triage Notes (Signed)
Pt to triage via w/c with no distress noted, mask in place; here approx mo ago and had defib/pacemaker removed due to infection in blood and heart; pt c/o swelling to legs and SHOB; denies any cough or congestion

## 2019-03-10 ENCOUNTER — Telehealth: Payer: Self-pay | Admitting: Family Medicine

## 2019-03-10 DIAGNOSIS — E7800 Pure hypercholesterolemia, unspecified: Secondary | ICD-10-CM

## 2019-03-10 DIAGNOSIS — Z794 Long term (current) use of insulin: Secondary | ICD-10-CM

## 2019-03-10 DIAGNOSIS — A419 Sepsis, unspecified organism: Secondary | ICD-10-CM

## 2019-03-10 DIAGNOSIS — N390 Urinary tract infection, site not specified: Secondary | ICD-10-CM

## 2019-03-10 DIAGNOSIS — G4733 Obstructive sleep apnea (adult) (pediatric): Secondary | ICD-10-CM

## 2019-03-10 DIAGNOSIS — I25118 Atherosclerotic heart disease of native coronary artery with other forms of angina pectoris: Secondary | ICD-10-CM

## 2019-03-10 DIAGNOSIS — I480 Paroxysmal atrial fibrillation: Secondary | ICD-10-CM

## 2019-03-10 DIAGNOSIS — E1169 Type 2 diabetes mellitus with other specified complication: Secondary | ICD-10-CM

## 2019-03-10 LAB — BASIC METABOLIC PANEL
Anion gap: 17 — ABNORMAL HIGH (ref 5–15)
BUN: 79 mg/dL — ABNORMAL HIGH (ref 8–23)
CO2: 22 mmol/L (ref 22–32)
Calcium: 8.6 mg/dL — ABNORMAL LOW (ref 8.9–10.3)
Chloride: 94 mmol/L — ABNORMAL LOW (ref 98–111)
Creatinine, Ser: 2.23 mg/dL — ABNORMAL HIGH (ref 0.44–1.00)
GFR calc Af Amer: 26 mL/min — ABNORMAL LOW (ref >60)
GFR calc non Af Amer: 22 mL/min — ABNORMAL LOW (ref >60)
Glucose, Bld: 283 mg/dL — ABNORMAL HIGH (ref 70–99)
Potassium: 3.9 mmol/L (ref 3.5–5.1)
Sodium: 133 mmol/L — ABNORMAL LOW (ref 135–145)

## 2019-03-10 LAB — CBC
HCT: 35.6 % — ABNORMAL LOW (ref 36.0–46.0)
Hemoglobin: 11.4 g/dL — ABNORMAL LOW (ref 12.0–15.0)
MCH: 27.4 pg (ref 26.0–34.0)
MCHC: 32 g/dL (ref 30.0–36.0)
MCV: 85.6 fL (ref 80.0–100.0)
Platelets: 175 10*3/uL (ref 150–400)
RBC: 4.16 MIL/uL (ref 3.87–5.11)
RDW: 14.9 % (ref 11.5–15.5)
WBC: 10.6 10*3/uL — ABNORMAL HIGH (ref 4.0–10.5)
nRBC: 0 % (ref 0.0–0.2)

## 2019-03-10 LAB — TROPONIN I
Troponin I: 0.28 ng/mL (ref <0.03)
Troponin I: 0.27 ng/mL (ref <0.03)

## 2019-03-10 LAB — LACTIC ACID, PLASMA
Lactic Acid, Venous: 2.1 mmol/L (ref 0.5–1.9)
Lactic Acid, Venous: 2.7 mmol/L (ref 0.5–1.9)

## 2019-03-10 LAB — GLUCOSE, CAPILLARY
Glucose-Capillary: 282 mg/dL — ABNORMAL HIGH (ref 70–99)
Glucose-Capillary: 241 mg/dL — ABNORMAL HIGH (ref 70–99)
Glucose-Capillary: 205 mg/dL — ABNORMAL HIGH (ref 70–99)

## 2019-03-10 MED ORDER — ASPIRIN EC 81 MG PO TBEC
81.0000 mg | DELAYED_RELEASE_TABLET | Freq: Every day | ORAL | Status: DC
  Administered 2019-03-10 – 2019-03-15 (×6): 81 mg via ORAL
  Filled 2019-03-10 (×6): qty 1

## 2019-03-10 MED ORDER — ONDANSETRON HCL 4 MG PO TABS: 4.0000 mg | ORAL_TABLET | Freq: Four times a day (QID) | ORAL | Status: DC | PRN

## 2019-03-10 MED ORDER — SODIUM CHLORIDE 0.9 % IV SOLN
2.0000 g | Freq: Two times a day (BID) | INTRAVENOUS | Status: DC
  Administered 2019-03-10: 2 g via INTRAVENOUS
  Filled 2019-03-10 (×2): qty 2

## 2019-03-10 MED ORDER — TORSEMIDE 20 MG PO TABS
40.0000 mg | ORAL_TABLET | Freq: Two times a day (BID) | ORAL | Status: DC
  Administered 2019-03-10: 40 mg via ORAL
  Filled 2019-03-10 (×2): qty 2

## 2019-03-10 MED ORDER — ORAL CARE MOUTH RINSE
15.0000 mL | Freq: Two times a day (BID) | OROMUCOSAL | Status: DC
  Administered 2019-03-11 – 2019-03-12 (×4): 15 mL via OROMUCOSAL

## 2019-03-10 MED ORDER — GABAPENTIN 600 MG PO TABS
600.0000 mg | ORAL_TABLET | Freq: Two times a day (BID) | ORAL | Status: DC
  Administered 2019-03-10: 600 mg via ORAL
  Filled 2019-03-10: qty 1

## 2019-03-10 MED ORDER — SODIUM CHLORIDE 0.9 % IV SOLN
1.0000 g | Freq: Every day | INTRAVENOUS | Status: DC
  Administered 2019-03-10 – 2019-03-11 (×2): 1 g via INTRAVENOUS
  Filled 2019-03-10 (×2): qty 1

## 2019-03-10 MED ORDER — ACETAMINOPHEN 325 MG PO TABS
650.0000 mg | ORAL_TABLET | Freq: Four times a day (QID) | ORAL | Status: DC | PRN
  Administered 2019-03-12: 650 mg via ORAL
  Filled 2019-03-10: qty 2

## 2019-03-10 MED ORDER — HEPARIN SODIUM (PORCINE) 5000 UNIT/ML IJ SOLN
5000.0000 [IU] | Freq: Three times a day (TID) | INTRAMUSCULAR | Status: DC
  Administered 2019-03-10 – 2019-03-15 (×14): 5000 [IU] via SUBCUTANEOUS
  Filled 2019-03-10 (×14): qty 1

## 2019-03-10 MED ORDER — ONDANSETRON HCL 4 MG/2ML IJ SOLN
4.0000 mg | Freq: Once | INTRAMUSCULAR | Status: AC
  Administered 2019-03-10: 4 mg via INTRAVENOUS

## 2019-03-10 MED ORDER — ACETAMINOPHEN 650 MG RE SUPP: 650.0000 mg | Freq: Four times a day (QID) | RECTAL | Status: DC | PRN

## 2019-03-10 MED ORDER — INSULIN ASPART 100 UNIT/ML ~~LOC~~ SOLN
0.0000 [IU] | Freq: Every day | SUBCUTANEOUS | Status: DC
  Administered 2019-03-10 – 2019-03-11 (×2): 2 [IU] via SUBCUTANEOUS
  Administered 2019-03-12: 3 [IU] via SUBCUTANEOUS
  Filled 2019-03-10 (×3): qty 1

## 2019-03-10 MED ORDER — CLOPIDOGREL BISULFATE 75 MG PO TABS
75.0000 mg | ORAL_TABLET | Freq: Every day | ORAL | Status: DC
  Administered 2019-03-10 – 2019-03-15 (×6): 75 mg via ORAL
  Filled 2019-03-10 (×6): qty 1

## 2019-03-10 MED ORDER — ROSUVASTATIN CALCIUM 10 MG PO TABS
40.0000 mg | ORAL_TABLET | Freq: Every day | ORAL | Status: DC
  Administered 2019-03-10 – 2019-03-14 (×5): 40 mg via ORAL
  Filled 2019-03-10 (×6): qty 4

## 2019-03-10 MED ORDER — INSULIN ASPART 100 UNIT/ML ~~LOC~~ SOLN
0.0000 [IU] | Freq: Three times a day (TID) | SUBCUTANEOUS | Status: DC
  Administered 2019-03-10: 3 [IU] via SUBCUTANEOUS
  Administered 2019-03-10: 5 [IU] via SUBCUTANEOUS
  Administered 2019-03-11 (×3): 2 [IU] via SUBCUTANEOUS
  Administered 2019-03-12: 3 [IU] via SUBCUTANEOUS
  Administered 2019-03-12: 5 [IU] via SUBCUTANEOUS
  Administered 2019-03-12 – 2019-03-13 (×2): 1 [IU] via SUBCUTANEOUS
  Administered 2019-03-13: 2 [IU] via SUBCUTANEOUS
  Administered 2019-03-13: 3 [IU] via SUBCUTANEOUS
  Administered 2019-03-14: 2 [IU] via SUBCUTANEOUS
  Administered 2019-03-14: 1 [IU] via SUBCUTANEOUS
  Administered 2019-03-14: 3 [IU] via SUBCUTANEOUS
  Administered 2019-03-15: 1 [IU] via SUBCUTANEOUS
  Filled 2019-03-10 (×15): qty 1

## 2019-03-10 MED ORDER — ONDANSETRON HCL 4 MG/2ML IJ SOLN
INTRAMUSCULAR | Status: AC
  Filled 2019-03-10: qty 2

## 2019-03-10 MED ORDER — INSULIN GLARGINE 100 UNIT/ML ~~LOC~~ SOLN
14.0000 [IU] | Freq: Every day | SUBCUTANEOUS | Status: DC
  Administered 2019-03-10 – 2019-03-14 (×5): 14 [IU] via SUBCUTANEOUS
  Filled 2019-03-10 (×7): qty 0.14

## 2019-03-10 MED ORDER — TORSEMIDE 20 MG PO TABS
20.0000 mg | ORAL_TABLET | Freq: Two times a day (BID) | ORAL | Status: DC
  Administered 2019-03-10: 20 mg via ORAL

## 2019-03-10 MED ORDER — VANCOMYCIN HCL 10 G IV SOLR: 1750.0000 mg | INTRAVENOUS | Status: DC

## 2019-03-10 MED ORDER — ONDANSETRON HCL 4 MG/2ML IJ SOLN: 4.0000 mg | Freq: Four times a day (QID) | INTRAMUSCULAR | Status: DC | PRN

## 2019-03-10 MED ORDER — POTASSIUM CHLORIDE CRYS ER 10 MEQ PO TBCR
10.0000 meq | EXTENDED_RELEASE_TABLET | Freq: Every day | ORAL | Status: DC
  Administered 2019-03-10: 10 meq via ORAL
  Filled 2019-03-10: qty 1

## 2019-03-10 NOTE — ED Notes (Signed)
Pure wick applied. Sheets changed and chux placed. Pt pulled up in bed.

## 2019-03-10 NOTE — Progress Notes (Signed)
Patient presently lying in the bed, remains alert and oriented, but patient has been drowsy , easily arousable MD aware,  VSS stable on oxygen [employer] sat 96%v at this time, will continue to monitor

## 2019-03-10 NOTE — Progress Notes (Signed)
Pharmacy Antibiotic Note  [name] is a 63 y.o. female admitted on 03/09/2019 with sepsis s/t UTI vs. Leg cellulitis.  Pharmacy has been consulted for vanc/cefepime dosing.  Plan: Patient received a 2.5g IV vanc load, cefepime 2g, and flagyl 500 mg IV x 1 in ED  Vancomycin 1750 mg IV Q 36 hrs. Goal AUC 400-550. Expected AUC: 525.3 SCr used: 2.08 Cssmin: 13.7  Will continue cefepime 2g IV q12h per CrCl of 30 - 60 ml/min. Will continue to monitor renal fx and s/sx of infx.  Height: 5\' 8"  (172.7 cm) Weight: (!) 363 lb (164.7 kg) IBW/kg (Calculated) : 63.9  Temp (24hrs), Avg:97.8 F (36.6 C), Min:97.8 F (36.6 C), Max:97.8 F (36.6 C)  Recent Labs  Lab 03/04/19 1618 03/09/19 2054 03/09/19 2340  WBC  --  9.2  --   CREATININE 1.79* 2.08*  --   LATICACIDVEN  --  3.0* 2.1*    Estimated Creatinine Clearance: 44.4 mL/min (A) (by C-G formula based on SCr of 2.08 mg/dL (H)).    No Known Allergies  Thank you for allowing pharmacy to be a part of this patient's care.  Tobie Lords, PharmD, BCPS Clinical Pharmacist 03/10/2019 3:26 AM

## 2019-03-10 NOTE — Telephone Encounter (Signed)
FYI

## 2019-03-10 NOTE — Telephone Encounter (Signed)
Patient has been admitted for sepsis

## 2019-03-10 NOTE — ED Notes (Addendum)
ED TO INPATIENT HANDOFF REPORT  ED Nurse Name and Phone #:   Gershon Mussel RN  [phone]  S Name/Age/Gender [name] 63 y.o. female Room/Bed: ED26A/ED26A  Code Status   Code Status: Prior  Home/SNF/Other Home Patient oriented to: self, place, time and situation Is this baseline? Yes   Triage Complete: Triage complete  Chief Complaint swelling  Triage Note Pt to triage via w/c with no distress noted, mask in place; here approx mo ago and had defib/pacemaker removed due to infection in blood and heart; pt c/o swelling to legs and SHOB; denies any cough or congestion   Allergies No Known Allergies  Level of Care/Admitting Diagnosis ED Disposition    ED Disposition Condition [facility] Area: [place] [100120]  Level of Care: Med-Surg [16]  Covid Evaluation: Screening Protocol (No Symptoms)  Diagnosis: Severe [facility]) [[id number]]  Admitting Physician: Lance Coon [[id number]]  Attending Physician: Lance Coon [phone]  Estimated length of stay: past midnight tomorrow  Certification:: I certify this patient will need inpatient services for at least 2 midnights  PT Class (Do Not Modify): Inpatient [101]  PT Acc Code (Do Not Modify): Private [1]       B Medical/Surgery History Past Medical History:  Diagnosis Date  . Acute myocardial infarction, subendocardial infarction, initial episode of care ([place]) 07/21/2012  . Acute osteomyelitis involving ankle and foot ([place]) 02/06/2015  . Acute systolic heart failure ([place]) 07/21/2012   New onset 07/19/12; admission to [facility] ED. Elevated Troponins.  S/p 2D-echo with EF 20-25%.  S/p cardiac catheterization with stenting LAD.  Repeat 2D-echo 10/2011 with improved EF of 35%.   . Anemia   . Automatic implantable cardioverter-defibrillator in situ    a. MDT CRT-D 06/2014, SN: [id number]  .removed in feb 2020  . CAD (coronary artery disease)    a. cardiac cath 101/04/2012: PCI/DES to chronically  occluded mLAD, consideration PCI to diag branch in 4 weeks.   . Cataract   . Chicken pox   . Chronic kidney disease   . Chronic systolic CHF (congestive heart failure) (HCC)    a. mixed ICM & NICM; b. EF 20-25% by echo 07/2012, mid-dist 2/3 of LV sev HK/AK, mild MR. echo 10/2012: EF 30-35%, sev HK ant-septal & inf walls, GR1DD, mild MR, PASP 33. c. echo 02/2013: EF 30%, GR1DD, mild MR. echo 04/2014: EF 30%, Septal-lat dyssynchrony, global HK, inf AK, GR1DD, mild MR. d. echo 10/2014: EF50-55%, WM nl, GR1DD, septal mild paradox. e. echo 02/2015: EF 50-55%, wm   . Depression   . Heart attack ([place])   . Heel ulcer ([place]) 04/27/2015  . History of blood transfusion ~ 2011   "plasma; had neuropathy; couldn't walk"  . Hypertension   . LBBB (left bundle branch block)   . Neuromuscular disorder ([place])   . Neuropathy 2011  . Obesity, unspecified   . OSA on CPAP    Moderate with AHI 23/hr and now on CPAP at 16cm H2O.  Her DME is [facility]  . Pure hypercholesterolemia   . Sepsis ([place]) 09/2018  . Type II diabetes mellitus ([place])   . Unspecified vitamin D deficiency    Past Surgical History:  Procedure Laterality Date  . AMPUTATION TOE Right 06/18/2015   Procedure: AMPUTATION TOE;  Surgeon: Samara Deist, DPM;  Location: [facility] ORS;  Service: Podiatry;  Laterality: Right;  . AMPUTATION TOE Left 10/08/2018   Procedure: AMPUTATION TOE LEFT 2ND;  Surgeon: Albertine Patricia, DPM;  Location: [facility] ORS;  Service: Podiatry;  Laterality: Left;  . BACK SURGERY    . BI-VENTRICULAR IMPLANTABLE CARDIOVERTER DEFIBRILLATOR N/A 07/06/2014   Procedure: BI-VENTRICULAR IMPLANTABLE CARDIOVERTER DEFIBRILLATOR  (CRT-D);  Surgeon: Deboraha Sprang, MD;  Location: [facility] CATH LAB;  Service: Cardiovascular;  Laterality: N/A;  . BI-VENTRICULAR IMPLANTABLE CARDIOVERTER DEFIBRILLATOR  (CRT-D)  07/06/2014  . BILATERAL OOPHORECTOMY  01/2011   ovarian cyst benign  . COLONOSCOPY WITH PROPOFOL Left 02/22/2015   Procedure: COLONOSCOPY WITH PROPOFOL;  Surgeon:  Hulen Luster, MD;  Location: [facility] ENDOSCOPY;  Service: Endoscopy;  Laterality: Left;  . CORONARY ANGIOPLASTY WITH STENT PLACEMENT Left 07/2012   new onset systolic CHF; elevated troponins.  Cardiac catheterization with stenting to LAD; EF 15%.  2D-echo: EF 20-25%.  . ESOPHAGOGASTRODUODENOSCOPY N/A 02/22/2015   Procedure: ESOPHAGOGASTRODUODENOSCOPY (EGD);  Surgeon: Hulen Luster, MD;  Location: [facility] ENDOSCOPY;  Service: Endoscopy;  Laterality: N/A;  . INCISION AND DRAINAGE ABSCESS Right 2007   groin; with ICU stay due to sepsis.  Marland Kitchen LAPAROSCOPIC CHOLECYSTECTOMY  2011  . LEFT HEART CATH AND CORONARY ANGIOGRAPHY N/A 10/05/2018   Procedure: LEFT HEART CATH AND CORONARY ANGIOGRAPHY;  Surgeon: Minna Merritts, MD;  Location: [place] CV LAB;  Service: Cardiovascular;  Laterality: N/A;  . LEFT HEART CATHETERIZATION WITH CORONARY ANGIOGRAM N/A 07/21/2012   Procedure: LEFT HEART CATHETERIZATION WITH CORONARY ANGIOGRAM;  Surgeon: Jolaine Artist, MD;  Location: [facility] CATH LAB;  Service: Cardiovascular;  Laterality: N/A;  . [place]   L4-5  . PACEMAKER REMOVAL  11/2018   due to infection around pacemaker  . PERCUTANEOUS CORONARY STENT INTERVENTION (PCI-S) N/A 07/23/2012   Procedure: PERCUTANEOUS CORONARY STENT INTERVENTION (PCI-S);  Surgeon: Sherren Mocha, MD;  Location: [facility] CATH LAB;  Service: Cardiovascular;  Laterality: N/A;  . PERIPHERAL VASCULAR BALLOON ANGIOPLASTY Left 10/06/2018   Procedure: PERIPHERAL VASCULAR BALLOON ANGIOPLASTY;  Surgeon: Katha Cabal, MD;  Location: [place] CV LAB;  Service: Cardiovascular;  Laterality: Left;  . PERIPHERAL VASCULAR CATHETERIZATION N/A 02/10/2015   Procedure: Picc Line Insertion;  Surgeon: Katha Cabal, MD;  Location: [place] CV LAB;  Service: Cardiovascular;  Laterality: N/A;  . TEE WITHOUT CARDIOVERSION N/A 10/02/2018   Procedure: TRANSESOPHAGEAL ECHOCARDIOGRAM (TEE);  Surgeon: Wellington Hampshire, MD;  Location: [facility] ORS;   Service: Cardiovascular;  Laterality: N/A;  . [place]  . VAGINAL HYSTERECTOMY  01/2011   Fibroids/DUB.  Ovaries removed. [place].     A IV Location/Drains/Wounds Patient Lines/Drains/Airways Status   Active Line/Drains/Airways    Name:   Placement date:   Placement time:   Site:   Days:   Peripheral IV 03/09/19 Right Antecubital   03/09/19    2109    Antecubital   1   Peripheral IV 03/09/19 Left Antecubital   03/09/19    2100    Antecubital   1          Intake/Output Last 24 hours  Intake/Output Summary (Last 24 hours) at 03/10/2019 0347 Last data filed at 03/10/2019 3710 Gross per 24 hour  Intake 1916.97 ml  Output -  Net 1916.97 ml    Labs/Imaging Results for orders placed or performed during the hospital encounter of 03/09/19 (from the past 48 hour(s))  Lactic acid, plasma     Status: Abnormal   Collection Time: 03/09/19  8:54 PM  Result Value Ref Range   Lactic Acid, Venous 3.0 (HH) 0.5 - 1.9 mmol/L    Comment: CRITICAL RESULT CALLED  TO, READ BACK BY AND VERIFIED WITH ISRAA MUHAMED 03/09/19 @ 2127  [place] Performed at [facility], [address] [place], [place] [address]   Comprehensive metabolic panel     Status: Abnormal   Collection Time: 03/09/19  8:54 PM  Result Value Ref Range   Sodium 133 (L) 135 - 145 mmol/L   Potassium 3.8 3.5 - 5.1 mmol/L   Chloride 91 (L) 98 - 111 mmol/L   CO2 25 22 - 32 mmol/L   Glucose, Bld 256 (H) 70 - 99 mg/dL   BUN 78 (H) 8 - 23 mg/dL   Creatinine, Ser 2.08 (H) 0.44 - 1.00 mg/dL   Calcium 9.3 8.9 - 10.3 mg/dL   Total Protein 7.7 6.5 - 8.1 g/dL   Albumin 4.0 3.5 - 5.0 g/dL   AST 24 15 - 41 U/L   ALT 12 0 - 44 U/L   Alkaline Phosphatase 63 38 - 126 U/L   Total Bilirubin 1.5 (H) 0.3 - 1.2 mg/dL   GFR calc non Af Amer 24 (L) >60 mL/min   GFR calc Af Amer 28 (L) >60 mL/min   Anion gap 17 (H) 5 - 15    Comment: Performed at [facility], [address] [place], [place] [address]  CBC WITH  DIFFERENTIAL     Status: Abnormal   Collection Time: 03/09/19  8:54 PM  Result Value Ref Range   WBC 9.2 4.0 - 10.5 K/uL   RBC 4.39 3.87 - 5.11 MIL/uL   Hemoglobin 12.0 12.0 - 15.0 g/dL   HCT 37.4 36.0 - 46.0 %   MCV 85.2 80.0 - 100.0 fL   MCH 27.3 26.0 - 34.0 pg   MCHC 32.1 30.0 - 36.0 g/dL   RDW 14.8 11.5 - 15.5 %   Platelets 234 150 - 400 K/uL   nRBC 0.0 0.0 - 0.2 %   Neutrophils Relative % 85 %   Neutro Abs 7.8 (H) 1.7 - 7.7 K/uL   Lymphocytes Relative 10 %   Lymphs Abs 0.9 0.7 - 4.0 K/uL   Monocytes Relative 5 %   Monocytes Absolute 0.4 0.1 - 1.0 K/uL   Eosinophils Relative 0 %   Eosinophils Absolute 0.0 0.0 - 0.5 K/uL   Basophils Relative 0 %   Basophils Absolute 0.0 0.0 - 0.1 K/uL   Immature Granulocytes 0 %   Abs Immature Granulocytes 0.03 0.00 - 0.07 K/uL    Comment: Performed at [facility], [address] [place], [place] [address]  Urinalysis, Routine w reflex microscopic     Status: Abnormal   Collection Time: 03/09/19  8:54 PM  Result Value Ref Range   Color, Urine AMBER (A) YELLOW    Comment: BIOCHEMICALS MAY BE AFFECTED BY COLOR   APPearance TURBID (A) CLEAR   Specific Gravity, Urine 1.017 1.005 - 1.030   pH 5.0 5.0 - 8.0   Glucose, UA NEGATIVE NEGATIVE mg/dL   Hgb urine dipstick LARGE (A) NEGATIVE   Bilirubin Urine NEGATIVE NEGATIVE   Ketones, ur NEGATIVE NEGATIVE mg/dL   Protein, ur 100 (A) NEGATIVE mg/dL   Nitrite NEGATIVE NEGATIVE   Leukocytes,Ua MODERATE (A) NEGATIVE   RBC / HPF >50 (H) 0 - 5 RBC/hpf   WBC, UA >50 (H) 0 - 5 WBC/hpf   Bacteria, UA FEW (A) NONE SEEN   Squamous Epithelial / LPF 0-5 0 - 5   Mucus PRESENT     Comment: Performed at [facility], [address]  [place], [place] [address]  Troponin I - Add-On to previous collection     Status: Abnormal   Collection Time: 03/09/19  8:54 PM  Result Value Ref Range   Troponin I 0.28 (HH) <0.03 ng/mL    Comment: CRITICAL RESULT CALLED TO, READ BACK BY AND VERIFIED  WITH RAQUEL DAVID 03/09/19 @ 2149  QSD Performed at [facility], [address], [place], [place] [address]   SARS Coronavirus 2 (CEPHEID - Performed in [place] hospital lab), Hosp Order     Status: None   Collection Time: 03/09/19  9:13 PM  Result Value Ref Range   SARS Coronavirus 2 NEGATIVE NEGATIVE    Comment: (NOTE) If result is NEGATIVE SARS-CoV-2 target nucleic acids are NOT DETECTED. The SARS-CoV-2 RNA is generally detectable in upper and lower  respiratory specimens during the acute phase of infection. The lowest  concentration of SARS-CoV-2 viral copies this assay can detect is 250  copies / mL. A negative result does not preclude SARS-CoV-2 infection  and should not be used as the sole basis for treatment or other  patient management decisions.  A negative result may occur with  improper specimen collection / handling, submission of specimen other  than nasopharyngeal swab, presence of viral mutation(s) within the  areas targeted by this assay, and inadequate number of viral copies  (<250 copies / mL). A negative result must be combined with clinical  observations, patient history, and epidemiological information. If result is POSITIVE SARS-CoV-2 target nucleic acids are DETECTED. The SARS-CoV-2 RNA is generally detectable in upper and lower  respiratory specimens dur ing the acute phase of infection.  Positive  results are indicative of active infection with SARS-CoV-2.  Clinical  correlation with patient history and other diagnostic information is  necessary to determine patient infection status.  Positive results do  not rule out bacterial infection or co-infection with other viruses. If result is PRESUMPTIVE POSTIVE SARS-CoV-2 nucleic acids MAY BE PRESENT.   A presumptive positive result was obtained on the submitted specimen  and confirmed on repeat testing.  While 2019 novel coronavirus  (SARS-CoV-2) nucleic acids may be present in the submitted sample   additional confirmatory testing may be necessary for epidemiological  and / or clinical management purposes  to differentiate between  SARS-CoV-2 and other Sarbecovirus currently known to infect humans.  If clinically indicated additional testing with an alternate test  methodology [phone]) is advised. The SARS-CoV-2 RNA is generally  detectable in upper and lower respiratory sp ecimens during the acute  phase of infection. The expected result is Negative. Fact Sheet for Patients:  StrictlyIdeas.no Fact Sheet for Healthcare Providers: BankingDealers.co.za This test is not yet approved or cleared by the [place] FDA and has been authorized for detection and/or diagnosis of SARS-CoV-2 by FDA under an Emergency Use Authorization (EUA).  This EUA will remain in effect (meaning this test can be used) for the duration of the COVID-19 declaration under Section 564(b)(1) of the Act, 21 U.S.C. section 360bbb-3(b)(1), unless the authorization is terminated or revoked sooner. Performed at [facility], [address] [place],  [address]   Lactic acid, plasma     Status: Abnormal   Collection Time: 03/09/19 11:40 PM  Result Value Ref Range   Lactic Acid, Venous 2.1 (HH) 0.5 - 1.9 mmol/L    Comment: CRITICAL RESULT CALLED TO, READ BACK BY AND VERIFIED WITH TOM Tonjua Rossetti ON 03/10/2019 AT 0005 QSD Performed at [facility], [address]  [place], [place] [address]   Pregnancy, urine POC     Status: None   Collection Time: 03/09/19 11:53 PM  Result Value Ref Range   Preg Test, Ur NEGATIVE NEGATIVE    Comment:        THE SENSITIVITY OF THIS METHODOLOGY IS >24 mIU/mL    Dg Chest Portable 1 View  Result Date: 03/09/2019 CLINICAL DATA:  Sepsis EXAM: PORTABLE CHEST 1 VIEW COMPARISON:  02/21/2019 FINDINGS: The cardiac silhouette remains enlarged. Prominent interstitial lung markings are again noted. An airspace opacity is  again noted in the right mid lung zone which has not changed significantly since the prior study. There is no pneumothorax. Small bilateral pleural effusions are noted. A left-sided lead list pacemaker is noted. IMPRESSION: Persistent findings of congestive heart failure, not significantly changed from prior study. Electronically Signed   By: Constance Holster M.D.   On: 03/09/2019 23:51    Pending Labs Unresulted Labs (From admission, onward)    Start     Ordered   03/10/19 0300  Lactic acid, plasma  Once,   STAT     03/10/19 0137   03/10/19 0057  Troponin I - ONCE - STAT  ONCE - STAT,   STAT     03/10/19 0056   03/09/19 2058  Blood Culture (routine x 2)  BLOOD CULTURE X 2,   STAT     03/09/19 2058   03/09/19 2058  Urine culture  ONCE - STAT,   STAT     03/09/19 2058   Signed and Held  CBC  (heparin)  Once,   R    Comments:  Baseline for heparin therapy IF NOT ALREADY DRAWN.  Notify MD if PLT < 100 K.    Signed and Held   Signed and Held  Creatinine, serum  (heparin)  Once,   R    Comments:  Baseline for heparin therapy IF NOT ALREADY DRAWN.    Signed and Held   Signed and Held  Basic metabolic panel  Tomorrow morning,   R     Signed and Held   Signed and Held  CBC  Tomorrow morning,   R     Signed and Held   Signed and Held  Troponin I - Tomorrow AM 0500  Tomorrow morning,   R     Signed and Held          Vitals/Pain Today's Vitals   03/10/19 0030 03/10/19 0100 03/10/19 0130 03/10/19 0326  BP: 106/68 118/78 122/78 126/75  Pulse: (!) 110 (!) 112 (!) 115 (!) 114  Resp: (!) 28 (!) 26 (!) 29 (!) 28  Temp:      TempSrc:      SpO2: 97% 95% 100% 94%  Weight:      Height:      PainSc:    0-No pain    Isolation Precautions No active isolations  Medications Medications  vancomycin (VANCOCIN) 1,750 mg in sodium chloride 0.9 % 500 mL IVPB (has no administration in time range)  ceFEPIme (MAXIPIME) 2 g in sodium chloride 0.9 % 100 mL IVPB (has no administration in time  range)  ceFEPIme (MAXIPIME) 2 g in sodium chloride 0.9 % 100 mL IVPB (0 g Intravenous Stopped 03/09/19 2153)  metroNIDAZOLE (FLAGYL) IVPB 500 mg (0 mg Intravenous Stopped 03/09/19 2259)  0.9 %  sodium chloride infusion ( Intravenous Stopped 03/10/19 0232)  vancomycin (VANCOCIN) 2,500 mg in sodium chloride 0.9 % 500 mL IVPB (0 mg Intravenous Stopped 03/10/19 0059)  ondansetron (ZOFRAN) injection 4 mg (4 mg Intravenous Given 03/10/19 0230)    Mobility non-ambulatory Low fall risk   Focused Assessments Cardiac Assessment Handoff:  Cardiac Rhythm: Sinus tachycardia Lab Results  Component Value Date   CKTOTAL 24 01/27/2015   CKMB 5.0 (H) 07/21/2012   TROPONINI 0.28 (HH) 03/09/2019   Lab Results  Component Value Date   DDIMER 1.77 (H) 07/20/2012   Does the Patient currently have chest pain? No     R Recommendations: See Admitting Provider Note  Report given to: Jeanette Caprice RN  Additional Notes:

## 2019-03-10 NOTE — ED Notes (Signed)
Date and time results received: 03/10/19 12:06 AM  Test: Lactic Acid Critical Value: 2.1  Name of Provider Notified: Dr Alfred Levins

## 2019-03-10 NOTE — Telephone Encounter (Signed)
Copied from [place] [id number]. Topic: General - Inquiry >> Mar 10, 2019  9:27 AM Mathis Bud wrote: Reason for CRM: Gertha Calkin called from [place] called stating she would like PCP to prescribe her something for her chronic constipation.  Caryl Pina also reports her Blood Sugar is still over 200.  Patient did go to ER last night 6/2 and is still there. Caryl Pina call back # [phone]

## 2019-03-10 NOTE — Consult Note (Addendum)
Cardiology Consultation:   Patient ID: [name] MRN: [id number]; DOB: 11-08-1955  Admit date: 03/09/2019 Date of Consult: 03/10/2019  Primary Care Provider: Rutherford Guys, MD Primary Cardiologist: Ida Rogue, MD  Primary Electrophysiologist:  Dr. Caryl Comes    Patient Profile:   [name] is a 63 y.o. 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 Jannifer Franklin.  History of Present Illness:   [name] is a 63 year old female with PMH as above.  NSTEMI 09/2018 2/2 demand ischemia from MSSA bacteremia.  Discharged from [facility] 10/2018 after osteomyelitis of the foot, toe amputation, and acute respiratory distress felt to be 2/2 anemia, pneumonia, systolic and diastolic CHF improved with diuretics. As noted in previous documentation, she has a known LBBB.   [facility] 11/22/2018 admission with elevated troponin, HTN, acute renal failure, and blood culture positive for MSSA bacteremia.  On 11/28/2018, she transferred from [facility] to [facility] and underwent removal of ICD in the setting of bacteremia and septic emboli. She was transferred back to [facility] following ICD removal. She was seen by cardiology with EP consultation and no plan for Life[facility]est as it was felt that she may not be a good candidate for placement of second ICD given complications as above.  She continue abx for endocarditis.  She was seen at follow-up 01/07/2019 by Dr. Caryl Comes.  Patient reported progressive dyspnea and SOB at that time, which was felt to be multifactorial with known CAD, left bundle branch block, recent endocarditis, anemia, and obesity/OSA.  She was documented as responding to  increased dosage of diuretic.  Plan was for continued medical management with torsemide consolidated from torsemide 20 BID to torsemide 40mg  once daily daily. It was also recommended she continue Fe replacement. Future labs were recommended including BMET, CBC, and echo in 2-3 months.   She was admitted 02/16/2019 with volume overload and underwent diuresis with net -13 L (likely more as appears I/O poorly documented) and down at least 8-9kg from time of admission.  She continued to be mildly volume overloaded; however, further diuresis was limited by hypotension. With improvement in SOB and LEE, she was discharged on torsemide 80 mg twice daily with as needed metolazone 5 mg for weight gain. She was not on an ACE/ARB/ARNI at discharge in the setting of AKI with CKD and with recommendation for f/u outpatient BMET in 1 week.  Atrial tachycardia was noted on review of telemetry during this admission and discharged with Toprol-XL 50mg  qd given this was less prone to hypotension over carvedilol. She was maintaining sinus rhythm at discharge with Zio monitor arranged to be mailed to her and worn for 14 days. EP follow-up arranged ~6 weeks from discharge to review these results. She was seen 03/04/2019 by [facility] cardiology for hospital follow-up and noted to be feeling well at that time with her weight at baseline/dry weight at 284 pounds.  She noted significant improvement in her lower extremity swelling and was able to lay fully supine without issues.  She did continue to note some shortness of breath, as well as fatigue.  No chest pain, palpitations, dizziness, presyncope, or syncope.  She was  wearing her ZIO monitor with follow-up echo scheduled for 6/10 and EP follow-up on 6/23. EKG at that time was SR. Torsemide and KCl was continued with recheck of BMP. Metoprolol was on hold given her hypotension.   Following outpatient BMET with increasing BUN, PTA torsemide was decreased to torsemide 20 mg twice daily.  In  addition, due to noted hypokalemia, she was instructed to take an extra 10 M EQ K+ x1 for 1 day and then continue with KCl 10 M EQ daily.  On 03/09/2019, she presented to the [facility] emergency department for generalized weakness and fever with self-reported temperature up to 104F.  No chest pain or shortness of breath reported at that time.  Vitals significant for BP 122/75, HR 127, RR 20.  T 90 7.72F.  Weight 363lbs, and as above, baseline reported of 284lbs. EKG showed sinus tachycardia with known left bundle branch block, no acute changes.  Chest x-ray was unchanged from prior with opacity in right middle lobe and possible increased haziness versus edema.  She was noted to meet sepsis criteria and diagnosed with a UTI.  Labs showed sodium 133, potassium 3.9, creatinine 2.23, BUN 79, WBC 10.6, hemoglobin 11.4, RBC 4.16.  She also had elevated troponin 0.28  0.28  0.27 and cardiology consulted for further evaluation.   Past Medical History:  Diagnosis Date   Acute myocardial infarction, subendocardial infarction, initial episode of care ([place]) 07/21/2012   Acute osteomyelitis involving ankle and foot ([place]) 04/09/813   Acute systolic heart failure ([place]) 07/21/2012   New onset 07/19/12; admission to [facility] ED. Elevated Troponins.  S/p 2D-echo with EF 20-25%.  S/p cardiac catheterization with stenting LAD.  Repeat 2D-echo 10/2011 with improved EF of 35%.    Anemia    Automatic implantable cardioverter-defibrillator in situ    a. MDT CRT-D 06/2014, SN: [id number]  .removed in feb 2020   CAD (coronary artery disease)    a. cardiac cath 101/04/2012: PCI/DES to chronically occluded mLAD, consideration PCI to diag branch in 4 weeks.    Cataract    Chicken pox    Chronic kidney disease    Chronic systolic CHF (congestive heart failure) (HCC)    a. mixed ICM & NICM; b. EF 20-25% by echo 07/2012, mid-dist 2/3 of LV sev HK/AK, mild MR. echo 10/2012: EF 30-35%, sev HK ant-septal & inf walls, GR1DD, mild  MR, PASP 33. c. echo 02/2013: EF 30%, GR1DD, mild MR. echo 04/2014: EF 30%, Septal-lat dyssynchrony, global HK, inf AK, GR1DD, mild MR. d. echo 10/2014: EF50-55%, WM nl, GR1DD, septal mild paradox. e. echo 02/2015: EF 50-55%, wm    Depression    Heart attack ([place])    Heel ulcer ([place]) 04/27/2015   History of blood transfusion ~ 2011   "plasma; had neuropathy; couldn't walk"   Hypertension    LBBB (left bundle branch block)    Neuromuscular disorder ([place])    Neuropathy 2011   Obesity, unspecified    OSA on CPAP    Moderate with AHI 23/hr and now on CPAP at 16cm H2O.  Her DME is [facility]   Pure hypercholesterolemia    Sepsis ([place]) 09/2018   Type II diabetes mellitus ([place])    Unspecified vitamin D deficiency     Past Surgical History:  Procedure Laterality Date   AMPUTATION TOE Right 06/18/2015   Procedure: AMPUTATION TOE;  Surgeon: Samara Deist, DPM;  Location: [facility] ORS;  Service: Podiatry;  Laterality: Right;   AMPUTATION TOE Left 10/08/2018  Procedure: AMPUTATION TOE LEFT 2ND;  Surgeon: Albertine Patricia, DPM;  Location: [facility] ORS;  Service: Podiatry;  Laterality: Left;   BACK SURGERY     BI-VENTRICULAR IMPLANTABLE CARDIOVERTER DEFIBRILLATOR N/A 07/06/2014   Procedure: BI-VENTRICULAR IMPLANTABLE CARDIOVERTER DEFIBRILLATOR  (CRT-D);  Surgeon: Deboraha Sprang, MD;  Location: [facility] CATH LAB;  Service: Cardiovascular;  Laterality: N/A;   BI-VENTRICULAR IMPLANTABLE CARDIOVERTER DEFIBRILLATOR  (CRT-D)  07/06/2014   BILATERAL OOPHORECTOMY  01/2011   ovarian cyst benign   COLONOSCOPY WITH PROPOFOL Left 02/22/2015   Procedure: COLONOSCOPY WITH PROPOFOL;  Surgeon: Hulen Luster, MD;  Location: [facility] ENDOSCOPY;  Service: Endoscopy;  Laterality: Left;   CORONARY ANGIOPLASTY WITH STENT PLACEMENT Left 07/2012   new onset systolic CHF; elevated troponins.  Cardiac catheterization with stenting to LAD; EF 15%.  2D-echo: EF 20-25%.   ESOPHAGOGASTRODUODENOSCOPY N/A 02/22/2015   Procedure:  ESOPHAGOGASTRODUODENOSCOPY (EGD);  Surgeon: Hulen Luster, MD;  Location: [facility] ENDOSCOPY;  Service: Endoscopy;  Laterality: N/A;   INCISION AND DRAINAGE ABSCESS Right 2007   groin; with ICU stay due to sepsis.   LAPAROSCOPIC CHOLECYSTECTOMY  2011   LEFT HEART CATH AND CORONARY ANGIOGRAPHY N/A 10/05/2018   Procedure: LEFT HEART CATH AND CORONARY ANGIOGRAPHY;  Surgeon: Minna Merritts, MD;  Location: [place] CV LAB;  Service: Cardiovascular;  Laterality: N/A;   LEFT HEART CATHETERIZATION WITH CORONARY ANGIOGRAM N/A 07/21/2012   Procedure: LEFT HEART CATHETERIZATION WITH CORONARY ANGIOGRAM;  Surgeon: Jolaine Artist, MD;  Location[facility] C[facility] LAB;  Service: Cardiovascular;  Laterality: N/A;   LUMBAR [place]   L4-5   PACEMAKER REMOVAL  11/2018   due to infection around pacemaker   [place] (PCI-S) N/A 07/23/2012   Procedure: PERCUTANEOUS CORONARY STENT INTERVENTION (PCI-S);  Surgeon: Sherren Mocha, MD;  Location: [facility] CATH LAB;  Service: Cardiovascular;  Laterality: N/A;   PERIPHERAL VASCULAR BALLOON ANGIOPLASTY Left 10/06/2018   Procedure: PERIPHERAL VASCULAR BALLOON ANGIOPLASTY;  Surgeon: Katha Cabal, MD;  Location: [place] CV LAB;  Service: Cardiovascular;  Laterality: Left;   PERIPHERAL VASCULAR CATHETERIZATION N/A 02/10/2015   Procedure: Picc Line Insertion;  Surgeon: Katha Cabal, MD;  Location: [place] CV LAB;  Service: Cardiovascular;  Laterality: N/A;   TEE WITHOUT CARDIOVERSION N/A 10/02/2018   Procedure: TRANSESOPHAGEAL ECHOCARDIOGRAM (TEE);  Surgeon: Wellington Hampshire, MD;  Location: [facility] ORS;  Service: Cardiovascular;  Laterality: N/A;   [place]  01/2011   Fibroids/DUB.  Ovaries removed. [place].     Home Medications:  Prior to Admission medications   Medication Sig Start Date End Date Taking? Authorizing Provider  aspirin EC 81 MG EC tablet Take 1 tablet (81 mg  total) by mouth daily. 11/24/18   Epifanio Lesches, MD  Calcium Carbonate-Vitamin D3 (CALCIUM 600-D) 600-400 MG-UNIT TABS Take 1 tablet by mouth 2 (two) times a day.    [provider]  clopidogrel (PLAVIX) 75 MG tablet Take 1 tablet (75 mg total) by mouth daily. 12/24/18   Rutherford Guys, MD  ferrous sulfate 325 (65 FE) MG tablet Take 325 mg by mouth 2 (two) times daily with a meal.    [provider]  gabapentin (NEURONTIN) 600 MG tablet Take 1 tablet (600 mg total) by mouth 2 (two) times daily. Takes 1 in the morning and two at bedtime Patient taking differently: Take 600 mg by mouth 2 (two) times daily. Take 1 tablet (600mg ) by mouth every morning and take 2 tablets (  1200mg ) by mouth at bedtime 12/24/18   Rutherford Guys, MD  glimepiride (AMARYL) 4 MG tablet Take 4 mg by mouth 2 (two) times daily.    [provider]  glucose blood (ONE TOUCH ULTRA TEST) test strip USE AS DIRECTED TO CHECK BLOOD SUGAR 3 TIMES DAILY 01/09/19   Rutherford Guys, MD  insulin glargine (LANTUS) 100 UNIT/ML injection Inject 0.16 mLs (16 Units total) into the skin at bedtime. Patient taking differently: Inject 14 Units into the skin at bedtime.  11/24/18   Epifanio Lesches, MD  Multiple Vitamin (MULTIVITAMIN) tablet Take 1 tablet by mouth daily.    [provider]  Omega-3 Fatty Acids (FISH OIL) 1000 MG CAPS Take 1 capsule by mouth 2 (two) times a day.     [provider]  potassium chloride (K-DUR) 10 MEQ tablet Take 1 tablet (10 meq) by mouth once daily    [provider]  rosuvastatin (CRESTOR) 40 MG tablet TAKE 1 TABLET BY MOUTH ONCE DAILY Patient taking differently: Take 40 mg by mouth daily.  02/08/19   Wellington Hampshire, MD  torsemide (DEMADEX) 20 MG tablet Take 2 tablets (40 mg) by mouth twice daily 03/09/19   Rise Mu, PA-C  traMADol (ULTRAM) 50 MG tablet Take 1 tablet (50 mg total) by mouth every 6 (six) hours as needed. 01/07/19   Rutherford Guys, MD     Inpatient Medications: Scheduled Meds:  aspirin EC  81 mg Oral Daily   clopidogrel  75 mg Oral Daily   heparin  5,000 Units Subcutaneous Q8H   insulin aspart  0-5 Units Subcutaneous QHS   insulin aspart  0-9 Units Subcutaneous TID WC   insulin glargine  14 Units Subcutaneous QHS   mouth rinse  15 mL Mouth Rinse BID   rosuvastatin  40 mg Oral Daily   torsemide  40 mg Oral BID   Continuous Infusions:  cefTRIAXone (ROCEPHIN)  IV 1 g (03/10/19 1312)   PRN Meds: acetaminophen **OR** acetaminophen, ondansetron **OR** ondansetron (ZOFRAN) IV  Allergies:   No Known Allergies  Social History:   Social History   Socioeconomic History   Marital status: Married    Spouse name: [name]   Number of children: 2   Years of education: 12   Highest education level: High school graduate  Occupational History   Occupation: disabled    Fish farm manager: UNEMPLOYED    Comment: 03/2010 for peripheral neuropathy   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  Social Designer, fashion/clothing strain: Not hard at all   Food insecurity:    Worry: Never true    Inability: Never true   Transportation needs:    Medical: No    Non-medical: No  Tobacco Use   Smoking status: Never Smoker   Smokeless tobacco: Never Used  Substance and Sexual Activity   Alcohol use: No    Alcohol/week: 0.0 standard drinks   Drug use: No   Sexual activity: Never    Birth control/protection: Post-menopausal, Surgical  Lifestyle   Physical activity:    Days per week: 0 days    Minutes per session: 0 min   Stress: Rather much  Relationships   Social connections:    Talks on phone: More than three times a week    Gets together: More than three times a week    Attends religious service: Never    Active member of club or organization: No    Attends meetings  of clubs or organizations: Never    Relationship status: Married   Intimate partner violence:    Fear of current or ex partner:  No    Emotionally abused: No    Physically abused: No    Forced sexual activity: No  Other Topics Concern   Not on file  Social History Narrative   Marital status:Married x 45 yrs. Happily married, no abuse.       Children:  2 children (daughter 31, son 82). Two grandsons and 2 step grandchildren.  1 gg.      Lives: with husband, daughter, son-in-law, 2 grandsons, and 1 on the way.      Employment: disability for peripheral neuropathy 2012.  Previously had home daycare.      Tobacco: none      Alcohol: none      Drugs: none      Exercise: none. [employer].      Pets: dog.      Always uses seat belts, smoke detectors in home.      No guns in the home.       Caffeine use: 2 cups coffee per day.       Nutrition: Well balanced diet.    Family History:    Family History  Problem Relation Age of Onset   Diabetes Mother    Hypertension Mother    Arthritis Mother        knees, lumbar DDD, cervical DDD   Cancer Father        prostate,skin,lymphoma.   Cancer Brother 18       bladder cancer; non-smoker   Diabetes Maternal Grandmother    Heart disease Maternal Grandmother    COPD Maternal Grandmother    Diabetes Paternal Grandmother    Obesity Brother    Diabetes Son    Hypertension Son    Cancer Maternal Grandfather    Diabetes Paternal Grandfather      ROS:  Please see the history of present illness.  Review of Systems  Constitutional: Positive for fever and malaise/fatigue.  Respiratory: Positive for shortness of breath.   Cardiovascular: Positive for leg swelling. Negative for chest pain, palpitations and orthopnea.  Gastrointestinal: Positive for nausea.  Neurological: Positive for weakness. Negative for loss of consciousness.  All other systems reviewed and are negative.   All other ROS reviewed and negative.     Physical Exam/Data:   Vitals:   03/10/19 0330 03/10/19 0431 03/10/19 0820 03/10/19 1321  BP: 118/75 124/74 110/71   Pulse: (!)  106 (!) 109 (!) 110   Resp: (!) 29 (!) 22 18   Temp:  99.2 F (37.3 C) 98.9 F (37.2 C)   TempSrc:  Oral Oral   SpO2: 91% 96% 94% 94%  Weight:  130.4 kg    Height:  5\' 8"  (1.727 m)      Intake/Output Summary (Last 24 hours) at 03/10/2019 1602 Last data filed at 03/10/2019 1457 Gross per 24 hour  Intake 1916.97 ml  Output 150 ml  Net 1766.97 ml   Filed Weights   03/09/19 2031 03/10/19 0431  Weight: (!) 164.7 kg 130.4 kg   Body mass index is 43.71 kg/m.  General: Obese female, somnolent on exam HEENT: normal Neck: no JVD Vascular: No carotid bruits; FA pulses 2+ bilaterally without bruits  Cardiac: Tachycardic but regular, 2/6 systolic murmur  Lungs:  clear to auscultation bilaterally with mild diminished sounds at bases, no wheezing, rhonchi or rales  Abd: soft, nontender, no  hepatomegaly  Ext: minimal bilateral lower extremity edema Musculoskeletal:  No deformities, BUE and BLE strength normal and equal Skin: warm and dry  Neuro:  somnolent Psych:  somnolent  EKG:  The EKG was personally reviewed and demonstrates:  ST, known LBBB Telemetry:  Telemetry was personally reviewed and demonstrates:  ST  Relevant CV Studies: Scheduled 5/20 echo  TTE 11/2018  1. The left ventricle has mild-moderately reduced systolic function, with an ejection fraction of 40-45%. The cavity size was mildly dilated. Left ventricular diastolic Doppler parameters are consistent with pseudonormalization.  2. The right ventricle has normal systolic function. The cavity was normal. There is no increase in right ventricular wall thickness.  3. Left atrial size was mildly dilated.  4. Right atrial size was mildly dilated.  5. The mitral valve is normal in structure.  6. The tricuspid valve is normal in structure.  7. The aortic valve is normal in structure.  8. The pulmonic valve was normal in structure.  FINDINGS  Left Ventricle: The left ventricle has mild-moderately reduced systolic function,  with an ejection fraction of 40-45%. The cavity size was mildly dilated. There is no increase in left ventricular wall thickness. Left ventricular diastolic Doppler  parameters are consistent with pseudonormalization Right Ventricle: The right ventricle has normal systolic function. The cavity was normal. There is no increase in right ventricular wall thickness.   Laboratory Data:  Chemistry Recent Labs  Lab 03/04/19 1618 03/09/19 2054 03/10/19 0527  NA 136 133* 133*  K 3.4* 3.8 3.9  CL 92* 91* 94*  CO2 31 25 22   GLUCOSE 136* 256* 283*  BUN 90* 78* 79*  CREATININE 1.79* 2.08* 2.23*  CALCIUM 9.4 9.3 8.6*  GFRNONAA 29* 24* 22*  GFRAA 34* 28* 26*  ANIONGAP 13 17* 17*    Recent Labs  Lab 03/09/19 2054  PROT 7.7  ALBUMIN 4.0  AST 24  ALT 12  ALKPHOS 63  BILITOT 1.5*   Hematology Recent Labs  Lab 03/09/19 2054 03/10/19 0527  WBC 9.2 10.6*  RBC 4.39 4.16  HGB 12.0 11.4*  HCT 37.4 35.6*  MCV 85.2 85.6  MCH 27.3 27.4  MCHC 32.1 32.0  RDW 14.8 14.9  PLT 234 175   Cardiac Enzymes Recent Labs  Lab 03/09/19 2054 03/10/19 0342 03/10/19 0527  TROPONINI 0.28* 0.28* 0.27*   No results for input(s): TROPIPOC in the last 168 hours.  BNPNo results for input(s): BNP, PROBNP in the last 168 hours.  DDimer No results for input(s): DDIMER in the last 168 hours.  Radiology/Studies:  Dg Chest Portable 1 View  Result Date: 03/09/2019 CLINICAL DATA:  Sepsis EXAM: PORTABLE CHEST 1 VIEW COMPARISON:  02/21/2019 FINDINGS: The cardiac silhouette remains enlarged. Prominent interstitial lung markings are again noted. An airspace opacity is again noted in the right mid lung zone which has not changed significantly since the prior study. There is no pneumothorax. Small bilateral pleural effusions are noted. A left-sided lead list pacemaker is noted. IMPRESSION: Persistent findings of congestive heart failure, not significantly changed from prior study. Electronically Signed   By:  Constance Holster M.D.   On: 03/09/2019 23:51    Assessment and Plan:   Chronic combined systolic and diastolic heart failure (EF 40-45%) -Relatively euvolemic on exam.  CXR unchanged from previous image. - Current weights may not be correct with 6/2 weight of 363lbs and 6/3 weight of 287 lbs. Continue daily standing weights. Dry weight noted to be 248lbs. - I/O documented as +1.8L for  the admission. Continue to document I/O. Strict I/O. - Holding diuretic at this time with recommendation to then decrease current torsemide to 20 mg twice daily given current renal function. Per review of EMR, s/p most recent outpatient BMET, recommendation was for decrease in torsemide from torsemide 40mg  BID to torsemide 20mg  BID dose.  At admission, diuresis was restarted at torsemide 40 mg twice daily.  Subsequent BMET has shown increase in creatinine from baseline SCr ~1.77   2.23.  BUN 78  79.   -- Hold KCl supplement, stopped this admission. K 3.9 today with goal 4.0. Will restart KCl tab 64meq at discharge with reduced torsemide given outpatient BMET with low K. Continue to monitor. - Daily BMET recommended to continue to monitor renal function and electrolytes.  - Continue to monitor I/O, daily weights.  --Follow-up in the office after discharge recommended.  UTI with sepsis - Per IM. Likely contributing to her SOB and fatigue, as well as current renal function.  -Daily BMET to monitor renal function and electrolytes. - Continue abx per IM.  Atrial tachycardia -Asymptomatic - previously noted on past admission. -14-day ZIO patch was mailed to patient with follow-up scheduled with EP. -Metoprolol held as outpatient d/t hypotension.  Continue to hold with SBP soft this admission.  --Rates likely elevated in the setting of infection / sepsis and up to 124bpm. -- If rates continue to remain high after resolution in infection, consider adding back low dose BB as BP allows.   Troponin elevation with h/o  CAD involving the native coronary arteries without angina -No chest pain. -Left heart cath 09/2018 with CTO of the LAD that is medically managed.  -Troponin minimally elevated and flat trending this admission and in the setting of sepsis with UTI.   -- EKG without acute changes. -- Suspect Tn elevation d/t supply demand ischemia in the setting of UTI /sepsis and elevated heart rate due to infection.   -Continue medical management with aspirin, Plavix, and Crestor.   -- As above, metoprolol on hold with hypotension and can be restarted with more stable BP for optimal heart rate control. -No plans for ischemic evaluation this admission.  Acute on chronic kidney disease III --Outpatient BMET showed BUN trending up with recommendation to decrease torsemide to 20 mg twice daily and take an extra potassium 10 M EQ x1 for 1 dose then resume 10 M EQ by mouth once daily.   --Torsemide held with restart at decreased dose above recommended to reflect this change.  Holding potassium supplementation as above with restart with diuresis before discharge. --Daily BMET.  Hypertension --Continues to be hypotensive this admission. -- Metoprolol held since 5/22 d/t hypotension and as an outpatient. Continue to hold -- Restart of torsemide at reduced dosage as above. Holding for now d/t renal function.  Hyperlipidemia -LDL of 25 01/2019 - Continue Crestor   For questions or updates, please contact [place] Please consult [email or web address] for contact info under     Signed, Arvil Chaco, PA-C  03/10/2019 4:02 PM

## 2019-03-10 NOTE — Progress Notes (Addendum)
Tried to call her husband Mr. Wunder to give an update.  Unable to reach and left a voicemail

## 2019-03-10 NOTE — Progress Notes (Signed)
[facility] [place] NAME: [name]    MR#:  [id number]  DATE OF BIRTH:  [birth year]  SUBJECTIVE:  CHIEF COMPLAINT:   Chief Complaint  Patient presents with  . Leg Swelling   - appears sleepy today, easily arousable though -Admitted with worsening shortness of breath and pedal edema.  REVIEW OF SYSTEMS:  Review of Systems  Constitutional: Positive for malaise/fatigue. Negative for chills and fever.  HENT: Negative for congestion, ear discharge, hearing loss and nosebleeds.   Eyes: Negative for blurred vision and double vision.  Respiratory: Positive for shortness of breath. Negative for cough and wheezing.   Cardiovascular: Positive for leg swelling. Negative for chest pain and palpitations.  Gastrointestinal: Negative for abdominal pain, constipation, diarrhea, nausea and vomiting.  Genitourinary: Negative for dysuria.  Musculoskeletal: Positive for myalgias.  Neurological: Negative for dizziness, focal weakness, seizures, weakness and headaches.  Psychiatric/Behavioral:       Very sleepy    DRUG ALLERGIES:  No Known Allergies  VITALS:  Blood pressure 110/71, pulse (!) 110, temperature 98.9 F (37.2 C), temperature source Oral, resp. rate 18, height 5\' 8"  (1.727 m), weight 130.4 kg, SpO2 94 %.  PHYSICAL EXAMINATION:  Physical Exam   GENERAL:  63 y.o.-year-old patient lying in the bed with no acute distress. Very sleepy. EYES: Pupils equal, round, reactive to light and accommodation. No scleral icterus. Extraocular muscles intact.  HEENT: Head atraumatic, normocephalic. Oropharynx and nasopharynx clear.  NECK:  Supple, no jugular venous distention. No thyroid enlargement, no tenderness.  LUNGS: Normal breath sounds bilaterally, no wheezing, rales,rhonchi or crepitation. No use of accessory muscles of respiration.  CARDIOVASCULAR: S1, S2 normal. No rubs, or gallops. 2/6 systolic murmur present External ICD placed on  chest, noted on left chest ABDOMEN: Soft, nontender, nondistended. Bowel sounds present. No organomegaly or mass.  EXTREMITIES: No cyanosis, or clubbing. 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 Sleepy, but arousable and following simple commands, overall weakness noted. Muscle strength equal  in all extremities. Sensation intact. Gait not checked.  PSYCHIATRIC: The patient is alert  When aroused  SKIN: No obvious rash, lesion, or ulcer.    LABORATORY PANEL:   CBC Recent Labs  Lab 03/10/19 0527  WBC 10.6*  HGB 11.4*  HCT 35.6*  PLT 175   ------------------------------------------------------------------------------------------------------------------  Chemistries  Recent Labs  Lab 03/09/19 2054 03/10/19 0527  NA 133* 133*  K 3.8 3.9  CL 91* 94*  CO2 25 22  GLUCOSE 256* 283*  BUN 78* 79*  CREATININE 2.08* 2.23*  CALCIUM 9.3 8.6*  AST 24  --   ALT 12  --   ALKPHOS 63  --   BILITOT 1.5*  --    ------------------------------------------------------------------------------------------------------------------  Cardiac Enzymes Recent Labs  Lab 03/10/19 0527  TROPONINI 0.27*   ------------------------------------------------------------------------------------------------------------------  RADIOLOGY:  Dg Chest Portable 1 View  Result Date: 03/09/2019 CLINICAL DATA:  Sepsis EXAM: PORTABLE CHEST 1 VIEW COMPARISON:  02/21/2019 FINDINGS: The cardiac silhouette remains enlarged. Prominent interstitial lung markings are again noted. An airspace opacity is again noted in the right mid lung zone which has not changed significantly since the prior study. There is no pneumothorax. Small bilateral pleural effusions are noted. A left-sided lead list pacemaker is noted. IMPRESSION: Persistent findings of congestive heart failure, not significantly changed from prior study. Electronically Signed   By: Constance Holster M.D.    On: 03/09/2019 23:51  EKG:   Orders placed or performed during the hospital encounter of 03/09/19  . ED EKG  . ED EKG  . ED EKG 12-Lead  . ED EKG 12-Lead  . EKG 12-Lead  . 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  1.  Acute on chronic systolic CHF exacerbation-echocardiogram with EF of 40 to 45% -Currently on torsemide twice daily.  Monitor I's and O's.  Still has pedal edema and shortness of breath. -Daily weights.  Monitor renal function is worse than baseline -Cardiology has been consulted  2.  Sepsis-secondary to acute cystitis.  Patient has known history of bacteremia.  Follow blood cultures and urine cultures -Lactic acid is elevated. Monitor wbc -Change antibiotics to Rocephin  3.  Acute renal failure on CKD stage III-Baseline creatinine is at 1.8, creatinine elevated, however on torsemide for CHF.  Monitor closely, if no improvement consider nephrology consultation in a.m.  4.  Diabetes mellitus-on Lantus and sliding scale insulin  5.  CAD-stable.  Troponin elevated, likely from demand ischemia.  On aspirin and Plavix  6.  DVT prophylaxis-subcu heparin    All the records are reviewed and case discussed with Care Management/Social Workerr. Management plans discussed with the patient, family and they are in agreement.  CODE STATUS: Full Code  TOTAL TIME TAKING CARE OF THIS PATIENT: 39 minutes.   POSSIBLE D/C IN 3 DAYS, DEPENDING ON CLINICAL CONDITION.   Gladstone Lighter M.D on 03/10/2019 at 1:32 PM  Between 7am to 6pm - Pager - [phone]  After 6pm go to [email or web address] - password [email or web address] [facility]   Office  [phone]  CC: Primary care physician; Rutherford Guys, MD

## 2019-03-10 NOTE — Telephone Encounter (Signed)
Could you please give Mrs. Caryl Pina a call from [facility] about Mrs. Dhara, advised her you are seeing pts and will give her a call at your earliest convenience.

## 2019-03-11 LAB — GLUCOSE, CAPILLARY
Glucose-Capillary: 171 mg/dL — ABNORMAL HIGH (ref 70–99)
Glucose-Capillary: 183 mg/dL — ABNORMAL HIGH (ref 70–99)
Glucose-Capillary: 164 mg/dL — ABNORMAL HIGH (ref 70–99)
Glucose-Capillary: 236 mg/dL — ABNORMAL HIGH (ref 70–99)

## 2019-03-11 LAB — BASIC METABOLIC PANEL
Anion gap: 16 — ABNORMAL HIGH (ref 5–15)
BUN: 90 mg/dL — ABNORMAL HIGH (ref 8–23)
CO2: 23 mmol/L (ref 22–32)
Calcium: 8.4 mg/dL — ABNORMAL LOW (ref 8.9–10.3)
Chloride: 93 mmol/L — ABNORMAL LOW (ref 98–111)
Creatinine, Ser: 2.56 mg/dL — ABNORMAL HIGH (ref 0.44–1.00)
GFR calc Af Amer: 22 mL/min — ABNORMAL LOW (ref >60)
GFR calc non Af Amer: 19 mL/min — ABNORMAL LOW (ref >60)
Glucose, Bld: 215 mg/dL — ABNORMAL HIGH (ref 70–99)
Potassium: 4.5 mmol/L (ref 3.5–5.1)
Sodium: 132 mmol/L — ABNORMAL LOW (ref 135–145)

## 2019-03-11 MED ORDER — SODIUM CHLORIDE 0.9 % IV SOLN
2.0000 g | Freq: Every day | INTRAVENOUS | Status: DC
  Administered 2019-03-12: 2 g via INTRAVENOUS
  Filled 2019-03-11: qty 2

## 2019-03-11 NOTE — Progress Notes (Signed)
[facility], [place] 03/11/19  Subjective:   LOS: 2 06/03 0701 - 06/04 0700 In: 237 [P.O.:237] Out: 300 [Urine:300]  Patient presented with c/o swelling and SOB, generalized weakness  Subjective fever at home   Objective:  Vital signs in last 24 hours:  Temp:  [98.5 F (36.9 C)-99.3 F (37.4 C)] 98.5 F (36.9 C) (06/04 0804) Pulse Rate:  [101-117] 106 (06/04 0804) Resp:  [16-20] 20 (06/04 0804) BP: (95-108)/(65-75) 102/75 (06/04 0804) SpO2:  [92 %-100 %] 99 % (06/04 0804) Weight:  [131.6 kg] 131.6 kg (06/04 0152)  Weight change: -33 kg Filed Weights   03/09/19 2031 03/10/19 0431 03/11/19 0152  Weight: (!) 164.7 kg 130.4 kg 131.6 kg    Intake/Output:    Intake/Output Summary (Last 24 hours) at 03/11/2019 1611 Last data filed at 03/11/2019 1300 Gross per 24 hour  Intake 477 ml  Output 650 ml  Net -173 ml    General  Alert, cooperative, no distress, appears stated age Head:   Normocephalic, without obvious abnormality, atraumatic Eyes/ENT:  conjunctiva/corneas clear,  moist oral mucus membranes Neck:  Supple,  thyroid: not enlarged, no JVD Lungs:   Clear to auscultation bilaterally, respirations unlabored Heart:   No rub or gallop Abdomen:   Soft, non-tender  Extremities: + cyanosis or edema Skin:  Skin color, texture, turgor normal, no rashes or lesions Neurologic: Alert and oriented, able to answer questions appropriately    Basic Metabolic Panel:  Recent Labs  Lab 03/04/19 1618 03/09/19 2054 03/10/19 0527 03/11/19 0313  NA 136 133* 133* 132*  K 3.4* 3.8 3.9 4.5  CL 92* 91* 94* 93*  CO2 31 25 22 23   GLUCOSE 136* 256* 283* 215*  BUN 90* 78* 79* 90*  CREATININE 1.79* 2.08* 2.23* 2.56*  CALCIUM 9.4 9.3 8.6* 8.4*     CBC: Recent Labs  Lab 03/09/19 2054 03/10/19 0527  WBC 9.2 10.6*  NEUTROABS 7.8*  --   HGB 12.0 11.4*  HCT 37.4 35.6*  MCV 85.2 85.6  PLT 234 175     No results found for: HEPBSAG, HEPBSAB,  HEPBIGM    Microbiology:  Recent Results (from the past 240 hour(s))  Blood Culture (routine x 2)     Status: None (Preliminary result)   Collection Time: 03/09/19  8:54 PM  Result Value Ref Range Status   Specimen Description BLOOD LEFT ASSIST CONTROL  Final   Special Requests   Final    BOTTLES DRAWN AEROBIC AND ANAEROBIC Blood Culture adequate volume   Culture   Final    NO GROWTH 2 DAYS Performed at [facility], [address] [place], [place] [address]    Report Status PENDING  Incomplete  Urine culture     Status: Abnormal (Preliminary result)   Collection Time: 03/09/19  8:54 PM  Result Value Ref Range Status   Specimen Description   Final    URINE, RANDOM Performed at [facility], [address], [place], [place] [address]    Special Requests   Final    NONE Performed at [facility], [address], [place], [place] [address]    Culture >=100,000 COLONIES/mL ESCHERICHIA COLI (A)  Final   Report Status PENDING  Incomplete  Blood Culture (routine x 2)     Status: None (Preliminary result)   Collection Time: 03/09/19  9:13 PM  Result Value Ref Range Status   Specimen Description BLOOD RIGHT ANTECUBITAL  Final   Special Requests  Final    BOTTLES DRAWN AEROBIC AND ANAEROBIC Blood Culture results may not be optimal due to an excessive volume of blood received in culture bottles   Culture   Final    NO GROWTH 2 DAYS Performed at [facility], [address], [place], [place] [address]    Report Status PENDING  Incomplete  SARS Coronavirus 2 (CEPHEID - Performed in [place] hospital lab), Hosp Order     Status: None   Collection Time: 03/09/19  9:13 PM  Result Value Ref Range Status   SARS Coronavirus 2 NEGATIVE NEGATIVE Final    Comment: (NOTE) If result is NEGATIVE SARS-CoV-2 target nucleic acids are NOT DETECTED. The SARS-CoV-2 RNA is generally detectable in upper and lower  respiratory specimens during the acute  phase of infection. The lowest  concentration of SARS-CoV-2 viral copies this assay can detect is 250  copies / mL. A negative result does not preclude SARS-CoV-2 infection  and should not be used as the sole basis for treatment or other  patient management decisions.  A negative result may occur with  improper specimen collection / handling, submission of specimen other  than nasopharyngeal swab, presence of viral mutation(s) within the  areas targeted by this assay, and inadequate number of viral copies  (<250 copies / mL). A negative result must be combined with clinical  observations, patient history, and epidemiological information. If result is POSITIVE SARS-CoV-2 target nucleic acids are DETECTED. The SARS-CoV-2 RNA is generally detectable in upper and lower  respiratory specimens dur ing the acute phase of infection.  Positive  results are indicative of active infection with SARS-CoV-2.  Clinical  correlation with patient history and other diagnostic information is  necessary to determine patient infection status.  Positive results do  not rule out bacterial infection or co-infection with other viruses. If result is PRESUMPTIVE POSTIVE SARS-CoV-2 nucleic acids MAY BE PRESENT.   A presumptive positive result was obtained on the submitted specimen  and confirmed on repeat testing.  While 2019 novel coronavirus  (SARS-CoV-2) nucleic acids may be present in the submitted sample  additional confirmatory testing may be necessary for epidemiological  and / or clinical management purposes  to differentiate between  SARS-CoV-2 and other Sarbecovirus currently known to infect humans.  If clinically indicated additional testing with an alternate test  methodology [phone]) is advised. The SARS-CoV-2 RNA is generally  detectable in upper and lower respiratory sp ecimens during the acute  phase of infection. The expected result is Negative. Fact Sheet for Patients:   StrictlyIdeas.no Fact Sheet for Healthcare Providers: BankingDealers.co.za This test is not yet approved or cleared by the [place] FDA and has been authorized for detection and/or diagnosis of SARS-CoV-2 by FDA under an Emergency Use Authorization (EUA).  This EUA will remain in effect (meaning this test can be used) for the duration of the COVID-19 declaration under Section 564(b)(1) of the Act, 21 U.S.C. section 360bbb-3(b)(1), unless the authorization is terminated or revoked sooner. Performed at [facility], [address] [place], [place] [address]     Coagulation Studies: No results for input(s): LABPROT, INR in the last 72 hours.  Urinalysis: Recent Labs    03/09/19 2054  COLORURINE AMBER*  LABSPEC 1.017  PHURINE 5.0  GLUCOSEU NEGATIVE  HGBUR LARGE*  BILIRUBINUR NEGATIVE  KETONESUR NEGATIVE  PROTEINUR 100*  NITRITE NEGATIVE  LEUKOCYTESUR MODERATE*      Imaging: Dg Chest Portable 1 View  Result Date: 03/09/2019 CLINICAL DATA:  Sepsis EXAM: PORTABLE CHEST 1 VIEW COMPARISON:  02/21/2019 FINDINGS: The cardiac silhouette remains enlarged. Prominent interstitial lung markings are again noted. An airspace opacity is again noted in the right mid lung zone which has not changed significantly since the prior study. There is no pneumothorax. Small bilateral pleural effusions are noted. A left-sided lead list pacemaker is noted. IMPRESSION: Persistent findings of congestive heart failure, not significantly changed from prior study. Electronically Signed   By: Constance Holster M.D.   On: 03/09/2019 23:51     Medications:   . [START ON 03/12/2019] cefTRIAXone (ROCEPHIN)  IV     . aspirin EC  81 mg Oral Daily  . clopidogrel  75 mg Oral Daily  . heparin  5,000 Units Subcutaneous Q8H  . insulin aspart  0-5 Units Subcutaneous QHS  . insulin aspart  0-9 Units Subcutaneous TID WC  . insulin glargine  14 Units  Subcutaneous QHS  . mouth rinse  15 mL Mouth Rinse BID  . rosuvastatin  40 mg Oral Daily   acetaminophen **OR** acetaminophen, ondansetron **OR** ondansetron (ZOFRAN) IV  Assessment/ Plan:  63 y.o. female with with insulin dependent diabetes mellitus type 2, diabetic neuropathy, hypertension, anemia, obstructive sleep apnea, positive ANA, hyperlipidemia, systolic congestive heart failure status post AICD placement- removed in 12/2018, coronary artery disease with stent  Principal Problem:   Severe sepsis ([place]) Active Problems:   Pure hypercholesterolemia   Essential hypertension, benign   OSA (obstructive sleep apnea)   Paroxysmal atrial fibrillation (H[facility])   Acute on chronic combined systolic and diastolic CHF (congestive heart failure) ([place])   UTI (urinary tract infection)   CAD (coronary artery disease)   Diabetes ([place])   #. ARF on CKD st 3 Baseline Creatinine 1.58/GFR 34 from 02/21/2019 Recent Labs    03/04/19 1618 03/09/19 2054 03/10/19 0527 03/11/19 0313  CREATININE 1.79* 2.08* 2.23* 2.56*  AKI may be related to concurrent infection/sepsis - continue treatment of underlying cause as you are doing  #. Anemia of CKD  Lab Results  Component Value Date   HGB 11.4 (L) 03/10/2019  continue to monitor closely  #. Bone and mineral metabolism  No results found for: PTH Lab Results  Component Value Date   PHOS 4.4 11/23/2018    #. UTI - iv rocephin  #. Diabetes type 2 with CKD Hgb A1c MFr Bld (%)  Date Value  02/03/2019 5.6     LOS: [place] 6/4/20204:11 PM  [place], [place]

## 2019-03-11 NOTE — Plan of Care (Signed)
  Problem: Education: Goal: Knowledge of General Education information will improve Description Including pain rating scale, medication(s)/side effects and non-pharmacologic comfort measures Outcome: Progressing   Problem: Clinical Measurements: Goal: Will remain free from infection Outcome: Progressing Goal: Respiratory complications will improve Outcome: Progressing   Problem: Safety: Goal: Ability to remain free from injury will improve Outcome: Progressing   Problem: Urinary Elimination: Goal: Signs and symptoms of infection will decrease Outcome: Progressing

## 2019-03-11 NOTE — Progress Notes (Signed)
Cardiac Rehab Navigator/ Exercise Physiologist Note  Patient has previously received Heart Failure education, videos, and Packet. Patient reports taking daily weights at home.Patient reports dehydration and kidney infection for this admission. This EP reviewed the 5 Simple Steps to living better with Heart Failure with Patient.     Patient is interested in participating in Pulmonary Rehab in the future.  Patient has next HF Clinic appt scheduled 03/24/19 at 10:20am.  Jasper Loser, [place]  Exercise Physiologist Department Phone #: [phone] Fax: [phone]  Direct Line [phone] Email Address: [email or web address]

## 2019-03-11 NOTE — Progress Notes (Signed)
[facility] at [place] NAME: [name]    MR#:  [id number]  DATE OF BIRTH:  10-May-1956  SUBJECTIVE:  CHIEF COMPLAINT:   Chief Complaint  Patient presents with  . Leg Swelling   -Patient is more alert today.  Still complains of significant weakness -Renal function worsening  REVIEW OF SYSTEMS:  Review of Systems  Constitutional: Positive for malaise/fatigue. Negative for chills and fever.  HENT: Negative for congestion, ear discharge, hearing loss and nosebleeds.   Eyes: Negative for blurred vision and double vision.  Respiratory: Positive for shortness of breath. Negative for cough and wheezing.   Cardiovascular: Positive for leg swelling. Negative for chest pain and palpitations.  Gastrointestinal: Negative for abdominal pain, constipation, diarrhea, nausea and vomiting.  Genitourinary: Negative for dysuria.  Musculoskeletal: Positive for myalgias.  Neurological: Negative for dizziness, focal weakness, seizures, weakness and headaches.  Psychiatric/Behavioral:       Very sleepy    DRUG ALLERGIES:  No Known Allergies  VITALS:  Blood pressure 102/75, pulse (!) 106, temperature 98.5 F (36.9 C), temperature source Oral, resp. rate 20, height 5\' 8"  (1.727 m), weight 131.6 kg, SpO2 99 %.  PHYSICAL EXAMINATION:  Physical Exam   GENERAL:  63 y.o.-year-old patient lying in the bed with no acute distress.  EYES: Pupils equal, round, reactive to light and accommodation. No scleral icterus. Extraocular muscles intact.  HEENT: Head atraumatic, normocephalic. Oropharynx and nasopharynx clear.  NECK:  Supple, no jugular venous distention. No thyroid enlargement, no tenderness.  LUNGS: Normal breath sounds bilaterally, no wheezing, rales,rhonchi or crepitation. No use of accessory muscles of respiration.  CARDIOVASCULAR: S1, S2 normal. No rubs, or gallops. 2/6 systolic murmur present External ICD placed on chest, noted on left chest  ABDOMEN: Soft, nontender, nondistended. Bowel sounds present. No organomegaly or mass.  EXTREMITIES: No cyanosis, or clubbing. 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   overall weakness noted. Muscle strength equal  in all extremities. Sensation intact. Gait not checked.  PSYCHIATRIC: The patient is alert  And oriented x 3  SKIN: No obvious rash, lesion, or ulcer.    LABORATORY PANEL:   CBC Recent Labs  Lab 03/10/19 0527  WBC 10.6*  HGB 11.4*  HCT 35.6*  PLT 175   ------------------------------------------------------------------------------------------------------------------  Chemistries  Recent Labs  Lab 03/09/19 2054  03/11/19 0313  NA 133*   < > 132*  K 3.8   < > 4.5  CL 91*   < > 93*  CO2 25   < > 23  GLUCOSE 256*   < > 215*  BUN 78*   < > 90*  CREATININE 2.08*   < > 2.56*  CALCIUM 9.3   < > 8.4*  AST 24  --   --   ALT 12  --   --   ALKPHOS 63  --   --   BILITOT 1.5*  --   --    < > = values in this interval not displayed.   ------------------------------------------------------------------------------------------------------------------  Cardiac Enzymes Recent Labs  Lab 03/10/19 0527  TROPONINI 0.27*   ------------------------------------------------------------------------------------------------------------------  RADIOLOGY:  Dg Chest Portable 1 View  Result Date: 03/09/2019 CLINICAL DATA:  Sepsis EXAM: PORTABLE CHEST 1 VIEW COMPARISON:  02/21/2019 FINDINGS: The cardiac silhouette remains enlarged. Prominent interstitial lung markings are again noted. An airspace opacity is again noted in the right mid lung zone which has not changed significantly  since the prior study. There is no pneumothorax. Small bilateral pleural effusions are noted. A left-sided lead list pacemaker is noted. IMPRESSION: Persistent findings of congestive heart failure, not significantly changed from prior study.  Electronically Signed   By: Constance Holster M.D.   On: 03/09/2019 23:51    EKG:   Orders placed or performed during the hospital encounter of 03/09/19  . ED EKG  . ED EKG  . ED EKG 12-Lead  . ED EKG 12-Lead  . EKG 12-Lead  . 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  1. Acute encephalopathy- secondary to UTI and sleep apnea - resolved now, improving Continue CPAP at bedtime  2. ARF on CKD stage3- Baseline creatinine is at 1.8, creatinine elevated, however on torsemide for CHF -Diuretics held as BUN also increasing.  Nephrology has been consulted.  Hold nephrotoxins  3.  Chronic systolic CHF -echocardiogram with EF of 40 to 45% -Torsemide on hold due to worsening renal function..  Monitor I's and O's.  Still has pedal edema -Daily weights.  Monitor renal function is worse than baseline -Cardiology has been consulted  4.  Sepsis-secondary to acute cystitis.  Patient has known history of bacteremia.  -Blood cultures are negative this time.  Urine cultures growing E. coli.-Lactic acid is elevated. Monitor wbc -Change antibiotics to Rocephin  5.  Diabetes mellitus-on Lantus and sliding scale insulin  6.  CAD-stable.  Troponin elevated, likely from demand ischemia.  On aspirin and Plavix  7.  DVT prophylaxis-subcu heparin    All the records are reviewed and case discussed with Care Management/Social Workerr. Management plans discussed with the patient, family and they are in agreement.  CODE STATUS: Full Code  TOTAL TIME TAKING CARE OF THIS PATIENT: 39 minutes.   POSSIBLE D/C IN 3 DAYS, DEPENDING ON CLINICAL CONDITION.   Gladstone Lighter M.D on 03/11/2019 at 2:02 PM  Between 7am to 6pm - Pager - [phone]  After 6pm go to [email or web address] - password [email or web address] [facility] [facility]  Office  [phone]  CC: Primary care physician; Rutherford Guys, MD

## 2019-03-11 NOTE — TOC Initial Note (Signed)
Transition of Care [facility]) - Initial/Assessment Note    Patient Details  Name: [name] MRN: [id number] Date of Birth: 1956-05-24  Transition of Care [facility]) CM/SW Contact:    Katrina Stack, RN Phone Number: 03/11/2019, 4:20 PM  Clinical Narrative:                Patient has risk score of 45%.  She has had three admissions since March 2020 for endocarditis, CHF exacerbation and June for shortness of breath and leg swelling.  She has had recent tele visits with [facility]. She has had a recent tele visit with her pcp. She is followed by [place] RN and PT. Spoke with liasion to provide information how care is being managed in the home. At the time of her May admission, patient reports being short of breath and weight gain for 3 days prior. Have asked agency to report how patient intervenes on her own behalf; is there a heart failure protocol in place; and idea of patient's compliance with meds, daily weights and diet. She          Patient Goals and CMS Choice   CMS Medicare.gov Compare Post Acute Care list provided to:: Patient    Expected Discharge Plan and Services   In-house Referral: [facility] Discharge Planning Services: CM Consult, HF Clinic                               HH Arranged: RN, PT [facility] Agency: [place] ([place]) Date [place]: 03/11/19 Time [place]: [place] Representative spoke with at [place]: Santiago Glad)  Prior Living Arrangements/Services   Lives with:: Spouse Patient language and need for interpreter reviewed:: Yes Do you feel safe going back to the place where you live?: Yes      Need for Family Participation in Patient Care: Yes (Comment)   Current home services: Home PT, Home RN Criminal Activity/Legal Involvement Pertinent to Current Situation/Hospitalization: No - Comment as needed  Activities of Daily Living Home Assistive Devices/Equipment: Shower chair with back, Cane (specify quad or  straight), Walker (specify type), Wheelchair, Raised toilet seat with rails, Eyeglasses, CBG Meter, Scales, Blood pressure cuff ADL Screening (condition at time of admission) Patient's cognitive ability adequate to safely complete daily activities?: Yes Is the patient deaf or have difficulty hearing?: No Does the patient have difficulty seeing, even when wearing glasses/contacts?: No Does the patient have difficulty concentrating, remembering, or making decisions?: No Patient able to express need for assistance with ADLs?: Yes Does the patient have difficulty dressing or bathing?: Yes Independently performs ADLs?: No Communication: Independent Dressing (OT): Needs assistance Is this a change from baseline?: Change from baseline, expected to last <3days Grooming: Needs assistance Is this a change from baseline?: Change from baseline, expected to last <3 days Feeding: Independent Bathing: Needs assistance Is this a change from baseline?: Change from baseline, expected to last <3 days Toileting: Needs assistance Is this a change from baseline?: Change from baseline, expected to last <3 days In/Out Bed: Needs assistance Is this a change from baseline?: Change from baseline, expected to last <3 days Walks in Home: Needs assistance Is this a change from baseline?: Change from baseline, expected to last <3 days Does the patient have difficulty walking or climbing stairs?: Yes Weakness of Legs: Both Weakness of Arms/Hands: None  Permission Sought/Granted Permission sought to share information with : Chartered certified accountant granted  to share information with : Yes, Verbal Permission Granted     Permission granted to share info w AGENCY: ([facility])        Emotional Assessment Appearance:: Appears stated age Attitude/Demeanor/Rapport: Gracious Affect (typically observed): Accepting, Calm Orientation: : Oriented to Self, Oriented to Place, Oriented to  Time, Oriented to  Situation Alcohol / Substance Use: Not Applicable Psych Involvement: No (comment)  Admission diagnosis:  Urinary tract infection without hematuria, site unspecified [N39.0] Sepsis, due to unspecified organism, unspecified whether acute organ dysfunction present [facility]) [A41.9] Patient Active Problem List   Diagnosis Date Noted  . UTI (urinary tract infection) 03/09/2019  . CAD (coronary artery disease) 03/09/2019  . Diabetes ([place]) 03/09/2019  . Atrial tachycardia ([place])   . Acute kidney injury superimposed on chronic kidney disease ([place])   . AKI (acute kidney injury) ([place])   . Weakness generalized 12/22/2018  . Shortness of breath 12/22/2018  . Palliative care encounter 12/22/2018  . Endocarditis 12/14/2018  . NSTEMI (non-ST elevated myocardial infarction) ([place]) 11/22/2018  . Acute on chronic combined systolic and diastolic CHF (congestive heart failure) ([place]) 10/28/2018  . Lymphedema 10/28/2018  . Severe sepsis ([place]) 09/28/2018  . History of amputation of lesser toe of right foot ([place]) 04/19/2018  . 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 Paraparesis of both lower limbs ([place]) 03/29/2018  . 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 Paroxysmal atrial fibrillation ([place]) 03/29/2018  . Class 3 obesity due to excess calories with serious comorbidity and body mass index (BMI) of 40.0 to 44.9 in adult 09/02/2016  . History of CVA (cerebrovascular accident) 11/11/2015  . Chronic renal insufficiency 08/04/2015  . OSA (obstructive sleep apnea) 08/15/2014  . Morbid obesity ([place]) 10/26/2013  . Essential hypertension, benign 04/29/2013  . Pure hypercholesterolemia 12/07/2012  . Iron deficiency anemia 12/07/2012  . Diabetic peripheral neuropathy associated with type 2 diabetes mellitus ([place]) 12/07/2012  . Chronic systolic congestive heart failure ([place]) 07/29/2012  . Left bundle-branch block 07/25/2012  . Coronary artery disease involving native coronary artery of  native heart without angina pectoris 07/25/2012   PCP:  Rutherford Guys, MD Pharmacy:   [facility] [place], [place] [address] Phone: [phone] Fax: [place], [place] [address] Phone: [phone] Fax: [phone]     Social Determinants of Health (SDOH) Interventions    Readmission Risk Interventions Readmission Risk Prevention Plan 02/18/2019  Transportation Screening Complete  Medication Review ([place]) Complete  PCP or Specialist appointment within 3-5 days of discharge Complete  HRI or [place] Complete  SW Recovery Care/Counseling Consult Patient refused  Palliative Care Screening Not [place] Not Applicable  Some recent data might be hidden

## 2019-03-12 DIAGNOSIS — I5043 Acute on chronic combined systolic (congestive) and diastolic (congestive) heart failure: Secondary | ICD-10-CM

## 2019-03-12 DIAGNOSIS — E1159 Type 2 diabetes mellitus with other circulatory complications: Secondary | ICD-10-CM

## 2019-03-12 LAB — BASIC METABOLIC PANEL
Anion gap: 13 (ref 5–15)
BUN: 95 mg/dL — ABNORMAL HIGH (ref 8–23)
CO2: 26 mmol/L (ref 22–32)
Calcium: 8.5 mg/dL — ABNORMAL LOW (ref 8.9–10.3)
Chloride: 93 mmol/L — ABNORMAL LOW (ref 98–111)
Creatinine, Ser: 2.54 mg/dL — ABNORMAL HIGH (ref 0.44–1.00)
GFR calc Af Amer: 22 mL/min — ABNORMAL LOW (ref >60)
GFR calc non Af Amer: 19 mL/min — ABNORMAL LOW (ref >60)
Glucose, Bld: 153 mg/dL — ABNORMAL HIGH (ref 70–99)
Potassium: 3.8 mmol/L (ref 3.5–5.1)
Sodium: 132 mmol/L — ABNORMAL LOW (ref 135–145)

## 2019-03-12 LAB — TROPONIN I
Troponin I: 0.06 ng/mL (ref <0.03)
Troponin I: 0.07 ng/mL (ref <0.03)

## 2019-03-12 LAB — URINE CULTURE: Culture: >=100000 — AB

## 2019-03-12 LAB — GLUCOSE, CAPILLARY
Glucose-Capillary: 132 mg/dL — ABNORMAL HIGH (ref 70–99)
Glucose-Capillary: 204 mg/dL — ABNORMAL HIGH (ref 70–99)
Glucose-Capillary: 254 mg/dL — ABNORMAL HIGH (ref 70–99)
Glucose-Capillary: 262 mg/dL — ABNORMAL HIGH (ref 70–99)

## 2019-03-12 MED ORDER — MORPHINE SULFATE (PF) 2 MG/ML IV SOLN
2.0000 mg | Freq: Three times a day (TID) | INTRAVENOUS | Status: DC | PRN
  Administered 2019-03-12: 2 mg via INTRAVENOUS
  Filled 2019-03-12: qty 1

## 2019-03-12 MED ORDER — CEPHALEXIN 500 MG PO CAPS
500.0000 mg | ORAL_CAPSULE | Freq: Three times a day (TID) | ORAL | Status: AC
  Administered 2019-03-13 – 2019-03-14 (×6): 500 mg via ORAL
  Filled 2019-03-12 (×6): qty 1

## 2019-03-12 MED ORDER — CEPHALEXIN 500 MG PO CAPS: 500.0000 mg | ORAL_CAPSULE | Freq: Three times a day (TID) | ORAL | Status: DC

## 2019-03-12 MED ORDER — AMIODARONE HCL IN DEXTROSE 360-4.14 MG/200ML-% IV SOLN: 60.0000 mg/h | INTRAVENOUS | Status: DC

## 2019-03-12 MED ORDER — TRAMADOL HCL 50 MG PO TABS
50.0000 mg | ORAL_TABLET | Freq: Four times a day (QID) | ORAL | Status: DC | PRN
  Administered 2019-03-12 – 2019-03-14 (×5): 50 mg via ORAL
  Filled 2019-03-12 (×5): qty 1

## 2019-03-12 MED ORDER — TIZANIDINE HCL 2 MG PO TABS
2.0000 mg | ORAL_TABLET | Freq: Once | ORAL | Status: AC
  Administered 2019-03-12: 2 mg via ORAL
  Filled 2019-03-12: qty 1

## 2019-03-12 MED ORDER — FUROSEMIDE 10 MG/ML IJ SOLN
40.0000 mg | Freq: Once | INTRAMUSCULAR | Status: AC
  Administered 2019-03-12: 40 mg via INTRAVENOUS
  Filled 2019-03-12: qty 4

## 2019-03-12 MED ORDER — AMIODARONE LOAD VIA INFUSION
150.0000 mg | Freq: Once | INTRAVENOUS | Status: DC
  Filled 2019-03-12: qty 83.34

## 2019-03-12 MED ORDER — AMIODARONE HCL IN DEXTROSE 360-4.14 MG/200ML-% IV SOLN: 30.0000 mg/h | INTRAVENOUS | Status: DC

## 2019-03-12 MED ORDER — METOPROLOL TARTRATE 5 MG/5ML IV SOLN
INTRAVENOUS | Status: AC
  Administered 2019-03-12: 5 mg via INTRAVENOUS
  Filled 2019-03-12: qty 5

## 2019-03-12 MED ORDER — METOPROLOL TARTRATE 5 MG/5ML IV SOLN
5.0000 mg | INTRAVENOUS | Status: AC
  Administered 2019-03-12: 5 mg via INTRAVENOUS

## 2019-03-12 NOTE — TOC Progression Note (Signed)
Transition of Care [facility]) - Progression Note    Patient Details  Name: [name] MRN: [id number] Date of Birth: [birth year]  Transition of Care [facility]) CM/SW Contact  Katrina Stack, RN Phone Number: 03/12/2019, 1:26 PM  Clinical Narrative:   Dominica Severin with [place] [facility] via voicemail of expected discharge within next 24-48 hours.  Patient did not qualify for continuous home oxygen today. CM had been informed today that patient wants a walker.  Patient declined when asked as she already has one.  After not qualifying for oxygen, and patient  placed back in the bed, oxygen was applied.  CM discussed with primary nurse that oxygen needs to be weaned and discontinued         Expected Discharge Plan and Services   In-house Referral: [facility] Discharge Planning Services: CM Consult, HF Clinic                               HH Arranged: RN, PT [facility] Agency: [place] ([facility]) Date [place]: 03/11/19 Time [place]: [place] Representative spoke with at [place]: Santiago Glad)   Social Determinants of Health ([place]) Interventions    Readmission Risk Interventions Readmission Risk Prevention Plan 02/18/2019  Transportation Screening Complete  Medication Review Press photographer) Complete  PCP or Specialist appointment within 3-5 days of discharge Complete  HRI or [place] Complete  SW Recovery Care/Counseling Consult Patient refused  Palliative Care Screening Not [place] Not Applicable  Some recent data might be hidden

## 2019-03-12 NOTE — Progress Notes (Signed)
RN notified Dr. Tressia Miners and Dr. Rockey Situ of HR in 130s with change in rhythm. Stat EKG completed. [name] assessed at bedside and ordered troponin series & 5mg  metoprolol IV push. Dose given and rate decreased to 95-105 bpm range. Gollan, MD at bedside assessed new heart rate, repeated EKG performed. Advised to give 2mg  morphine dose as BP is too low for nitroglycerin. Patient started with 10/10 pain in upper abdomen wrapping around at beginning of event. After morphine and decreased heart rate patient now stating pain is 2/10. Gollan to place orders for Lasix one time dose for concerns of fluid building.

## 2019-03-12 NOTE — TOC Progression Note (Signed)
Transition of Care [facility]) - Progression Note    Patient Details  Name: [name] MRN: [id number] Date of Birth: May 22, 1956  Transition of Care [facility]) CM/SW Contact  Katrina Stack, RN Phone Number: 03/12/2019, 3:50 PM  Clinical Narrative:    Had episode of elevated heart rate with significant chest pain.  Cardiology is assessing and treating        Expected Discharge Plan and Services   In-house Referral: [facility] Discharge Planning Services: CM Consult, HF Clinic                               HH Arranged: RN, PT [facility] Agency: [place] ([facility]) Date [place]: 03/11/19 Time [place]: [place] Representative spoke with at [place]: Santiago Glad)   Social Determinants of Health ([place]) Interventions    Readmission Risk Interventions Readmission Risk Prevention Plan 02/18/2019  Transportation Screening Complete  Medication Review Press photographer) Complete  PCP or Specialist appointment within 3-5 days of discharge Complete  HRI or [place] Complete  SW Recovery Care/Counseling Consult Patient refused  Palliative Care Screening Not [facility] Not Applicable  Some recent data might be hidden

## 2019-03-12 NOTE — Progress Notes (Signed)
[facility], [place] 03/12/19  Subjective:   LOS: 3 06/04 0701 - 06/05 0700 In: 480 [P.O.:480] Out: 925 [Urine:925]  Patient presented with c/o swelling and SOB, generalized weakness  Subjective fever at home Quite lethargic this afternoon Urine culture positive for UTI with E. coli   Objective:  Vital signs in last 24 hours:  Temp:  [97.5 F (36.4 C)-99.2 F (37.3 C)] 97.5 F (36.4 C) (06/05 0735) Pulse Rate:  [88-106] 103 (06/05 1513) Resp:  [18-20] 18 (06/04 2358) BP: (92-108)/(62-77) 92/62 (06/05 1513) SpO2:  [93 %-100 %] 99 % (06/05 1513) Weight:  [133.2 kg] 133.2 kg (06/05 0239)  Weight change: 1.588 kg Filed Weights   03/10/19 0431 03/11/19 0152 03/12/19 0239  Weight: 130.4 kg 131.6 kg 133.2 kg    Intake/Output:    Intake/Output Summary (Last 24 hours) at 03/12/2019 1543 Last data filed at 03/12/2019 1242 Gross per 24 hour  Intake 240 ml  Output 700 ml  Net -460 ml    General  no distress, appears stated age Head:   Normocephalic, without obvious abnormality, atraumatic Eyes/ENT:  conjunctiva/corneas clear,  moist oral mucus membranes Neck:  Supple,  thyroid: not enlarged, no JVD Lungs:    respirations unlabored, coarse breath sounds at bases Heart:   No rub or gallop Abdomen:   Soft, non-tender  Extremities: + edema Skin:  Skin color, texture, turgor normal, no rashes or lesions Neurologic: Very sleepy this afternoon.    Basic Metabolic Panel:  Recent Labs  Lab 03/09/19 2054 03/10/19 0527 03/11/19 0313 03/12/19 0428  NA 133* 133* 132* 132*  K 3.8 3.9 4.5 3.8  CL 91* 94* 93* 93*  CO2 25 22 23 26   GLUCOSE 256* 283* 215* 153*  BUN 78* 79* 90* 95*  CREATININE 2.08* 2.23* 2.56* 2.54*  CALCIUM 9.3 8.6* 8.4* 8.5*     CBC: Recent Labs  Lab 03/09/19 2054 03/10/19 0527  WBC 9.2 10.6*  NEUTROABS 7.8*  --   HGB 12.0 11.4*  HCT 37.4 35.6*  MCV 85.2 85.6  PLT 234 175     No results found for: HEPBSAG, HEPBSAB,  HEPBIGM    Microbiology:  Recent Results (from the past 240 hour(s))  Blood Culture (routine x 2)     Status: None (Preliminary result)   Collection Time: 03/09/19  8:54 PM  Result Value Ref Range Status   Specimen Description BLOOD LEFT ASSIST CONTROL  Final   Special Requests   Final    BOTTLES DRAWN AEROBIC AND ANAEROBIC Blood Culture adequate volume   Culture   Final    NO GROWTH 3 DAYS Performed at [facility], [address], [place], [place] [address]    Report Status PENDING  Incomplete  Urine culture     Status: Abnormal   Collection Time: 03/09/19  8:54 PM  Result Value Ref Range Status   Specimen Description   Final    URINE, RANDOM Performed at [facility], [address] [place], [place] [address]    Special Requests   Final    NONE Performed at [facility], [address] [place], [place] [address]    Culture >=100,000 COLONIES/mL ESCHERICHIA COLI (A)  Final   Report Status 03/12/2019 FINAL  Final   Organism ID, Bacteria ESCHERICHIA COLI (A)  Final      Susceptibility   Escherichia coli - MIC*    AMPICILLIN >=32 RESISTANT Resistant     CEFAZOLIN 8 SENSITIVE Sensitive  CEFTRIAXONE <=1 SENSITIVE Sensitive     CIPROFLOXACIN <=0.25 SENSITIVE Sensitive     GENTAMICIN <=1 SENSITIVE Sensitive     IMIPENEM <=0.25 SENSITIVE Sensitive     NITROFURANTOIN <=16 SENSITIVE Sensitive     TRIMETH/SULFA >=320 RESISTANT Resistant     AMPICILLIN/SULBACTAM >=32 RESISTANT Resistant     PIP/TAZO 64 INTERMEDIATE Intermediate     Extended ESBL NEGATIVE Sensitive     * >=100,000 COLONIES/mL ESCHERICHIA COLI  Blood Culture (routine x 2)     Status: None (Preliminary result)   Collection Time: 03/09/19  9:13 PM  Result Value Ref Range Status   Specimen Description BLOOD RIGHT ANTECUBITAL  Final   Special Requests   Final    BOTTLES DRAWN AEROBIC AND ANAEROBIC Blood Culture results may not be optimal due to an excessive volume of blood  received in culture bottles   Culture   Final    NO GROWTH 3 DAYS Performed at [facility], [address] [place], [place] [address]    Report Status PENDING  Incomplete  SARS Coronavirus 2 (CEPHEID - Performed in [place] hospital lab), Hosp Order     Status: None   Collection Time: 03/09/19  9:13 PM  Result Value Ref Range Status   SARS Coronavirus 2 NEGATIVE NEGATIVE Final    Comment: (NOTE) If result is NEGATIVE SARS-CoV-2 target nucleic acids are NOT DETECTED. The SARS-CoV-2 RNA is generally detectable in upper and lower  respiratory specimens during the acute phase of infection. The lowest  concentration of SARS-CoV-2 viral copies this assay can detect is 250  copies / mL. A negative result does not preclude SARS-CoV-2 infection  and should not be used as the sole basis for treatment or other  patient management decisions.  A negative result may occur with  improper specimen collection / handling, submission of specimen other  than nasopharyngeal swab, presence of viral mutation(s) within the  areas targeted by this assay, and inadequate number of viral copies  (<250 copies / mL). A negative result must be combined with clinical  observations, patient history, and epidemiological information. If result is POSITIVE SARS-CoV-2 target nucleic acids are DETECTED. The SARS-CoV-2 RNA is generally detectable in upper and lower  respiratory specimens dur ing the acute phase of infection.  Positive  results are indicative of active infection with SARS-CoV-2.  Clinical  correlation with patient history and other diagnostic information is  necessary to determine patient infection status.  Positive results do  not rule out bacterial infection or co-infection with other viruses. If result is PRESUMPTIVE POSTIVE SARS-CoV-2 nucleic acids MAY BE PRESENT.   A presumptive positive result was obtained on the submitted specimen  and confirmed on repeat testing.  While 2019 novel  coronavirus  (SARS-CoV-2) nucleic acids may be present in the submitted sample  additional confirmatory testing may be necessary for epidemiological  and / or clinical management purposes  to differentiate between  SARS-CoV-2 and other Sarbecovirus currently known to infect humans.  If clinically indicated additional testing with an alternate test  methodology [phone]) is advised. The SARS-CoV-2 RNA is generally  detectable in upper and lower respiratory sp ecimens during the acute  phase of infection. The expected result is Negative. Fact Sheet for Patients:  StrictlyIdeas.no Fact Sheet for Healthcare Providers: BankingDealers.co.za This test is not yet approved or cleared by the [place] FDA and has been authorized for detection and/or diagnosis of SARS-CoV-2 by FDA under an Emergency Use Authorization (EUA).  This EUA will  remain in effect (meaning this test can be used) for the duration of the COVID-19 declaration under Section 564(b)(1) of the Act, 21 U.S.C. section 360bbb-3(b)(1), unless the authorization is terminated or revoked sooner. Performed at [facility], [address] [place], [place] [address]     Coagulation Studies: No results for input(s): LABPROT, INR in the last 72 hours.  Urinalysis: Recent Labs    03/09/19 2054  COLORURINE AMBER*  LABSPEC 1.017  PHURINE 5.0  GLUCOSEU NEGATIVE  HGBUR LARGE*  BILIRUBINUR NEGATIVE  KETONESUR NEGATIVE  PROTEINUR 100*  NITRITE NEGATIVE  LEUKOCYTESUR MODERATE*      Imaging: No results found.   Medications:    . aspirin EC  81 mg Oral Daily  . [START ON 03/13/2019] cephALEXin  500 mg Oral TID  . clopidogrel  75 mg Oral Daily  . heparin  5,000 Units Subcutaneous Q8H  . insulin aspart  0-5 Units Subcutaneous QHS  . insulin aspart  0-9 Units Subcutaneous TID WC  . insulin glargine  14 Units Subcutaneous QHS  . mouth rinse  15 mL Mouth Rinse BID  .  rosuvastatin  40 mg Oral Daily   acetaminophen **OR** acetaminophen, morphine injection, ondansetron **OR** ondansetron (ZOFRAN) IV, traMADol  Assessment/ Plan:  63 y.o. female with with insulin dependent diabetes mellitus type 2, diabetic neuropathy, hypertension, anemia, obstructive sleep apnea, positive ANA, hyperlipidemia, systolic congestive heart failure status post AICD placement- removed in 12/2018, coronary artery disease with stent  Principal Problem:   Severe sepsis ([place]) Active Problems:   Pure hypercholesterolemia   Essential hypertension, benign   OSA (obstructive sleep apnea)   Paroxysmal atrial fibrillation (HCC)   Acute on chronic combined systolic and diastolic CHF (congestive heart failure) ([place])   UTI (urinary tract infection)   CAD (coronary artery disease)   Diabetes ([place])   #. ARF on CKD st 3 Baseline Creatinine 1.58/GFR 34 from 02/21/2019 Recent Labs    03/09/19 2054 03/10/19 0527 03/11/19 0313 03/12/19 0428  CREATININE 2.08* 2.23* 2.56* 2.54*  AKI may be related to concurrent infection/sepsis - continue treatment of underlying cause as you are doing  #. Anemia of CKD  Lab Results  Component Value Date   HGB 11.4 (L) 03/10/2019  continue to monitor closely  #. Bone and mineral metabolism  No results found for: PTH Lab Results  Component Value Date   PHOS 4.4 11/23/2018    #. UTI - iv rocephin -E. coli  #. Diabetes type 2 with CKD Hgb A1c MFr Bld (%)  Date Value  02/03/2019 5.6     LOS: [place] 6/5/20203:43 PM  [place], [place]

## 2019-03-12 NOTE — Progress Notes (Signed)
Progress Note  Patient Name: [name] Date of Encounter: 03/12/2019  Primary Cardiologist: Ida Rogue, MD , [facility]  Subjective   Increasingly alert, recovered from her urinary tract infection/encephalopathy She also reports that she had not slept for 3 days prior to arrival to the hospital, is exhausted  This afternoon with acute onset tachycardia , rate up to 130 bpm or higher, with associated fullness in her chest and upper epigastric area (typical anginal symptoms).  She estimated the pain at 10/10 EKG was obtained and reviewed She was given metoprolol 5 mg IV push x1 with improvement of her rate Right rapidly improved down to 95 up to 100 bpm Pain down to 6/10 Morphine provided, pain down to 2/10 Reports feeling of fullness in her upper epigastric area, typical for when she retains fluid/CHF symptoms  Diuretics have been held this admission secondary to worsening renal dysfunction felt secondary to ATN  Inpatient Medications    Scheduled Meds: . aspirin EC  81 mg Oral Daily  . [START ON 03/13/2019] cephALEXin  500 mg Oral TID  . clopidogrel  75 mg Oral Daily  . heparin  5,000 Units Subcutaneous Q8H  . insulin aspart  0-5 Units Subcutaneous QHS  . insulin aspart  0-9 Units Subcutaneous TID WC  . insulin glargine  14 Units Subcutaneous QHS  . mouth rinse  15 mL Mouth Rinse BID  . rosuvastatin  40 mg Oral Daily   Continuous Infusions:  PRN Meds: acetaminophen **OR** acetaminophen, morphine injection, ondansetron **OR** ondansetron (ZOFRAN) IV, traMADol   Vital Signs    Vitals:   03/12/19 0239 03/12/19 0328 03/12/19 0735 03/12/19 1513  BP:  108/77 98/68 92/62   Pulse:  98 88 (!) 103  Resp:      Temp:  97.8 F (36.6 C) (!) 97.5 F (36.4 C)   TempSrc:  Oral Oral   SpO2:  96% 93% 99%  Weight: 133.2 kg     Height:        Intake/Output Summary (Last 24 hours) at 03/12/2019 1650 Last data filed at 03/12/2019 1242 Gross per 24 hour  Intake 240 ml   Output 700 ml  Net -460 ml   Last 3 Weights 03/12/2019 03/11/2019 03/10/2019  Weight (lbs) 293 lb 11.2 oz 290 lb 3.2 oz 287 lb 8 oz  Weight (kg) 133.221 kg 131.634 kg 130.409 kg  Some encounter information is confidential and restricted. Go to Review Flowsheets activity to see all data.      Telemetry    Normal sinus rhythm left bundle branch block This afternoon 4 PM with acute onset tachycardia rate up to 130s, unable to exclude atrial flutter- Personally Reviewed  ECG     - Personally Reviewed  Physical Exam  Obese GEN: No acute distress.   Neck: No JVD Cardiac:  Tachycardic, no murmurs, rubs, or gallops.  Respiratory: Clear to auscultation bilaterally. GI: Soft, nontender, non-distended  MS: No edema; No deformity. Neuro:  Nonfocal  Psych: Normal affect   Labs    Chemistry Recent Labs  Lab 03/09/19 2054 03/10/19 0527 03/11/19 0313 03/12/19 0428  NA 133* 133* 132* 132*  K 3.8 3.9 4.5 3.8  CL 91* 94* 93* 93*  CO2 25 22 23 26   GLUCOSE 256* 283* 215* 153*  BUN 78* 79* 90* 95*  CREATININE 2.08* 2.23* 2.56* 2.54*  CALCIUM 9.3 8.6* 8.4* 8.5*  PROT 7.7  --   --   --   ALBUMIN 4.0  --   --   --  AST 24  --   --   --   ALT 12  --   --   --   ALKPHOS 63  --   --   --   BILITOT 1.5*  --   --   --   GFRNONAA 24* 22* 19* 19*  GFRAA 28* 26* 22* 22*  ANIONGAP 17* 17* 16* 13     Hematology Recent Labs  Lab 03/09/19 2054 03/10/19 0527  WBC 9.2 10.6*  RBC 4.39 4.16  HGB 12.0 11.4*  HCT 37.4 35.6*  MCV 85.2 85.6  MCH 27.3 27.4  MCHC 32.1 32.0  RDW 14.8 14.9  PLT 234 175    Cardiac Enzymes Recent Labs  Lab 03/09/19 2054 03/10/19 0342 03/10/19 0527 03/12/19 1500  TROPONINI 0.28* 0.28* 0.27* 0.06*   No results for input(s): TROPIPOC in the last 168 hours.   BNPNo results for input(s): BNP, PROBNP in the last 168 hours.   DDimer No results for input(s): DDIMER in the last 168 hours.   Radiology    No results found.  Cardiac Studies     Patient  Profile     63 year old woman with history of severe coronary artery disease, diabetes type 2, morbid obesity, cardiomyopathy ejection fraction 40%, MSSA bacteremia with removal of ICD at [facility] presenting with sepsis, UTI, encephalopathy elevated troponin  Assessment & Plan    Elevated troponin/stable angina Demand ischemia on arrival in the setting of sepsis, UTI, tachycardia on arrival --- Episode of tachycardia, angina this afternoon that resolved with metoprolol, morphine and oxygen Unable to give nitro secondary to low blood pressure --- Cardiac catheterization report reviewed from December 2019 -There is severe mid to distal LAD disease, circumflex disease, diagonal disease Medical management previously recommended but that was prior to toe amputation December 2019 -If she continues to have unstable angina symptoms, she may need repeat cardiac catheterization with possible intervention -Continue aspirin Plavix, statin,  We will try to add low-dose beta-blocker with close monitoring of blood pressure  History of pulmonary edema, systolic and diastolic CHF She reports fullness in her abdomen consistent with prior CHF symptoms We have ordered Lasix 40 mg IV x1, cautious in the setting of acute on chronic renal failure secondary to ATN --- She typically takes torsemide 40 twice daily at home  Acute on chronic kidney disease Secondary to baseline renal dysfunction and ATN in the setting of UTI  Encephalopathy  secondary to urinary tract infection, on broad-spectrum antibiotics Resolved  Significant time spent at the bedside in the setting of chest pain this afternoon, discussed with hospitalist service, nursing  Total encounter time more than 45 minutes  Greater than 50% was spent in counseling and coordination of care with the patient   For questions or updates, please contact [facility] Please consult [email or web address] for contact info under        Signed, Ida Rogue, MD   03/12/2019, 4:50 PM

## 2019-03-12 NOTE — Care Management Important Message (Signed)
Important Message  Patient Details  Name: [name] MRN: [id number] Date of Birth: 07-24-1956   Medicare Important Message Given:  Yes    Dannette Barbara 03/12/2019, 11:12 AM

## 2019-03-12 NOTE — Progress Notes (Signed)
[place] [facility] [place] NAME: [name]    MR#:  [id number]  DATE OF BIRTH:  Dec 04, 1955  SUBJECTIVE:  CHIEF COMPLAINT:   Chief Complaint  Patient presents with  . Leg Swelling   -Appears much better today.  Renal function is still worse.  Off diuretics currently.  REVIEW OF SYSTEMS:  Review of Systems  Constitutional: Positive for malaise/fatigue. Negative for chills and fever.  HENT: Negative for congestion, ear discharge, hearing loss and nosebleeds.   Eyes: Negative for blurred vision and double vision.  Respiratory: Positive for shortness of breath. Negative for cough and wheezing.   Cardiovascular: Positive for leg swelling. Negative for chest pain and palpitations.  Gastrointestinal: Negative for abdominal pain, constipation, diarrhea, nausea and vomiting.  Genitourinary: Negative for dysuria.  Musculoskeletal: Positive for myalgias.  Neurological: Negative for dizziness, focal weakness, seizures, weakness and headaches.    DRUG ALLERGIES:  No Known Allergies  VITALS:  Blood pressure 98/68, pulse 88, temperature (!) 97.5 F (36.4 C), temperature source Oral, resp. rate 18, height 5\' 8"  (1.727 m), weight 133.2 kg, SpO2 93 %.  PHYSICAL EXAMINATION:  Physical Exam   GENERAL:  63 y.o.-year-old patient lying in the bed with no acute distress.  EYES: Pupils equal, round, reactive to light and accommodation. No scleral icterus. Extraocular muscles intact.  HEENT: Head atraumatic, normocephalic. Oropharynx and nasopharynx clear.  NECK:  Supple, no jugular venous distention. No thyroid enlargement, no tenderness.  LUNGS: Normal breath sounds bilaterally, no wheezing, rales,rhonchi or crepitation. No use of accessory muscles of respiration.  CARDIOVASCULAR: S1, S2 normal. No rubs, or gallops. 2/6 systolic murmur present External ICD placed on chest, noted on left chest ABDOMEN: Soft, nontender, nondistended. Bowel sounds present.  No organomegaly or mass.  EXTREMITIES: No cyanosis, or clubbing. 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   overall weakness noted. Muscle strength equal  in all extremities. Sensation intact. Gait not checked.  PSYCHIATRIC: The patient is alert  And oriented x 3  SKIN: No obvious rash, lesion, or ulcer.    LABORATORY PANEL:   CBC Recent Labs  Lab 03/10/19 0527  WBC 10.6*  HGB 11.4*  HCT 35.6*  PLT 175   ------------------------------------------------------------------------------------------------------------------  Chemistries  Recent Labs  Lab 03/09/19 2054  03/12/19 0428  NA 133*   < > 132*  K 3.8   < > 3.8  CL 91*   < > 93*  CO2 25   < > 26  GLUCOSE 256*   < > 153*  BUN 78*   < > 95*  CREATININE 2.08*   < > 2.54*  CALCIUM 9.3   < > 8.5*  AST 24  --   --   ALT 12  --   --   ALKPHOS 63  --   --   BILITOT 1.5*  --   --    < > = values in this interval not displayed.   ------------------------------------------------------------------------------------------------------------------  Cardiac Enzymes Recent Labs  Lab 03/10/19 0527  TROPONINI 0.27*   ------------------------------------------------------------------------------------------------------------------  RADIOLOGY:  No results found.  EKG:   Orders placed or performed during the hospital encounter of 03/09/19  . ED EKG  . ED EKG  . ED EKG 12-Lead  . ED EKG 12-Lead  . EKG 12-Lead  . 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  1. Acute encephalopathy- secondary to UTI and sleep apnea - resolved now,  improving Continue CPAP at bedtime  2. ARF on CKD stage3- Baseline creatinine is at 1.8, creatinine elevated, however was on torsemide for CHF -Diuretics held as BUN also increasing.  Nephrology has been consulted.  Hold nephrotoxins -Creatinine at 2.5 and stable today.  Continue to monitor closely for improvement  3.  Chronic systolic CHF -echocardiogram with EF of 40 to 45% -Torsemide on hold due to worsening renal function..  Monitor I's and O's.  Still has some pedal edema -Daily weights.  Monitor renal function as it is worse than baseline -Cardiology has been consulted  4.  Sepsis-secondary to acute cystitis.  - Patient has had history of MSSA bacteremia.  Blood cultures now negative.  Urine cultures growing E. coli.  Currently on Rocephin.  Will change to Keflex for 2 more days.  Likely can be stopped at discharge.  5.  Diabetes mellitus-on Lantus and sliding scale insulin  6.  CAD-stable.  Troponin elevated, from demand ischemia.  On aspirin and Plavix  7.  DVT prophylaxis-subcu heparin  Physical therapy consulted-recommended home health at discharge. -Discharge in 1 to 2 days as renal function improves.    All the records are reviewed and case discussed with Care Management/Social Workerr. Management plans discussed with the patient, family and they are in agreement.  CODE STATUS: Full Code  TOTAL TIME TAKING CARE OF THIS PATIENT: 37 minutes.   POSSIBLE D/C IN 1-2 DAYS, DEPENDING ON CLINICAL CONDITION.   Gladstone Lighter M.D on 03/12/2019 at 12:01 PM  Between 7am to 6pm - Pager - [phone]  After 6pm go to [email or web address] - password [email or web address] [facility]  Office  [phone]  CC: Primary care physician; Rutherford Guys, MD

## 2019-03-12 NOTE — Progress Notes (Addendum)
Physical Therapy Evaluation Patient Details Name: [name] MRN: [id number] DOB: 1956-03-14 Today's Date: 03/12/2019   History of Present Illness  [name]  is a 63 y.o. female who presents with chief complaint of leg swelling and SOB. Patient has had multiple admissions to our hospital recently for infection and pacemaker complication.  She presents to the ED again today with a complaint of shortness of breath and leg swelling.  She is noted here to meet sepsis criteria and have a UTI.  She also has an elevated troponin and some edema in her lungs. Currently admitted for sepsis secondary to UTI, acute on chronic CHF and acute encephalopathy secondary to UTI.  Clinical Impression  Pt admitted with above diagnosis. Pt currently with functional limitations due to the deficits listed below (see PT Problem List). Pt is modified independent for bed mobility. She requires CGA for transfesr and increased time to complete, but able to rise without physical assist from therapist. Pt ambulates partially around B-pod RN station. Reciprocal stepping pattern with decreased L foot clearance (baseline for patient; has brace at home); Slow, but steady, cadence without buckling or LOB. Pt reports significant DOE rating it as 8/10 on 0-10 dyspnea scale. SaO2 at 95% at rest on room air and remains at 94% during exertion on room air. Pt requires one standing rest break and then has to return to her room. She has balance deficits in single leg stance but no fall history. Generalized LE weakness is obvious. Pt should resume HH PT at discharge. Pt will benefit from PT services to address deficits in strength, balance, and mobility in order to return to full function at home.   SaO2 on room air at rest = 95*% SaO2 on room air while ambulating = 94%      Follow Up Recommendations Home health PT;Other (comment)(Resume HH PT at discharge)    Equipment Recommendations  None recommended by PT    Recommendations  for Other Services       Precautions / Restrictions Precautions Precautions: Fall Restrictions Weight Bearing Restrictions: No      Mobility  Bed Mobility Overal bed mobility: Modified Independent                Transfers Overall transfer level: Needs assistance Equipment used: Rolling walker (2 wheeled) Transfers: Sit to/from Stand Sit to Stand: Min guard         General transfer comment: increased time to complete, but able to rise without physical assist from therapist  Ambulation/Gait Ambulation/Gait assistance: Min guard Gait Distance (Feet): 120 Feet Assistive device: Rolling walker (2 wheeled)       General Gait Details: Reciprocal stepping pattern with decreased L foot clearance (baseline for patient; has brace at home); Slow, but steady, cadence without buckling or LOB. Pt reports significant DOE rating it as 8/10 on 0-10 dyspnea scale. SaO2 at 95% at rest on room air and remains at 94% during exertion on room air. Pt requires one standin grest break and then has to return to her room.  Stairs            Wheelchair Mobility    Modified Rankin (Stroke Patients Only)       Balance Overall balance assessment: Needs assistance Sitting-balance support: No upper extremity supported;Feet supported Sitting balance-Leahy Scale: Good     Standing balance support: No upper extremity supported Standing balance-Leahy Scale: Fair Standing balance comment: Able to maintain leg apart/together with eyes open/closed without issue. Single leg balance is less  than 1 second bilaterally. No fall history per patient                             Pertinent Vitals/Pain Pain Assessment: No/denies pain    Home Living Family/patient expects to be discharged to:: Private residence Living Arrangements: Spouse/significant other;Children;Other relatives Available Help at Discharge: Available 24 hours/day;Family Type of Home: House Home Access: Stairs to  enter Entrance Stairs-Rails: None Entrance Stairs-Number of Steps: 1 Home Layout: Two level;Able to live on main level with bedroom/bathroom Home Equipment: Gilford Rile - 2 wheels;Bedside commode;Shower seat;Cane - single point;Wheelchair - manual      Prior Function Level of Independence: Independent         Comments: Indep for ADLs, household and limited community mobility with spc vs no assistive device. No home O2; denies fall history. Husband/family involved and very supportive.     Hand Dominance   Dominant Hand: Right    Extremity/Trunk Assessment   Upper Extremity Assessment Upper Extremity Assessment: Overall WFL for tasks assessed    Lower Extremity Assessment Lower Extremity Assessment: Generalized weakness;LLE deficits/detail LLE Deficits / Details: Grossly at least 4/5 throughnot BLEs with the exception of L foot drop which is chronic.Marland Kitchen Pt wears an AFO at home       Communication   Communication: No difficulties  Cognition Arousal/Alertness: Lethargic(Initially lethargic but improves with time) Behavior During Therapy: WFL for tasks assessed/performed Overall Cognitive Status: Within Functional Limits for tasks assessed                                        General Comments      Exercises     Assessment/Plan    PT Assessment Patient needs continued PT services  PT Problem List Decreased strength;Decreased activity tolerance;Decreased mobility;Decreased balance;Decreased cognition;Decreased knowledge of use of DME;Decreased safety awareness;Cardiopulmonary status limiting activity       PT Treatment Interventions DME instruction;Gait training;Stair training;Functional mobility training;Therapeutic activities;Therapeutic exercise;Balance training;Patient/family education    PT Goals (Current goals can be found in the Care Plan section)  Acute Rehab PT Goals Patient Stated Goal: to return home PT Goal Formulation: With patient Time For  Goal Achievement: 03/26/19 Potential to Achieve Goals: Good    Frequency Min 2X/week   Barriers to discharge        Co-evaluation               AM-PAC PT "6 Clicks" Mobility  Outcome Measure Help needed turning from your back to your side while in a flat bed without using bedrails?: None Help needed moving from lying on your back to sitting on the side of a flat bed without using bedrails?: None Help needed moving to and from a bed to a chair (including a wheelchair)?: None Help needed standing up from a chair using your arms (e.g., wheelchair or bedside chair)?: A Little Help needed to walk in hospital room?: A Little Help needed climbing 3-5 steps with a railing? : A Little 6 Click Score: 21    End of Session Equipment Utilized During Treatment: Gait belt Activity Tolerance: Patient limited by fatigue Patient left: in bed;with call bell/phone within reach;with bed alarm set;Other (comment)(Pt sitting upright at EOB) Nurse Communication: Mobility status PT Visit Diagnosis: Muscle weakness (generalized) (M62.81);Difficulty in walking, not elsewhere classified (R26.2)    Time: 1105-1130 PT Time Calculation (min) (ACUTE  ONLY): 25 min   Charges:   PT Evaluation $PT Eval Low Complexity: 1 Low PT Treatments $Therapeutic Activity: 8-22 mins        Lyndel Safe Huprich PT, DPT, GCS   Huprich,Jason 03/12/2019, 11:54 AM

## 2019-03-13 DIAGNOSIS — R6520 Severe sepsis without septic shock: Secondary | ICD-10-CM

## 2019-03-13 LAB — GLUCOSE, CAPILLARY
Glucose-Capillary: 136 mg/dL — ABNORMAL HIGH (ref 70–99)
Glucose-Capillary: 211 mg/dL — ABNORMAL HIGH (ref 70–99)
Glucose-Capillary: 195 mg/dL — ABNORMAL HIGH (ref 70–99)
Glucose-Capillary: 200 mg/dL — ABNORMAL HIGH (ref 70–99)

## 2019-03-13 LAB — BASIC METABOLIC PANEL
Anion gap: 13 (ref 5–15)
BUN: 99 mg/dL — ABNORMAL HIGH (ref 8–23)
CO2: 24 mmol/L (ref 22–32)
Calcium: 8.1 mg/dL — ABNORMAL LOW (ref 8.9–10.3)
Chloride: 90 mmol/L — ABNORMAL LOW (ref 98–111)
Creatinine, Ser: 2.27 mg/dL — ABNORMAL HIGH (ref 0.44–1.00)
GFR calc Af Amer: 25 mL/min — ABNORMAL LOW (ref >60)
GFR calc non Af Amer: 22 mL/min — ABNORMAL LOW (ref >60)
Glucose, Bld: 175 mg/dL — ABNORMAL HIGH (ref 70–99)
Potassium: 3.8 mmol/L (ref 3.5–5.1)
Sodium: 127 mmol/L — ABNORMAL LOW (ref 135–145)

## 2019-03-13 LAB — TROPONIN I: Troponin I: 0.09 ng/mL (ref <0.03)

## 2019-03-13 MED ORDER — FUROSEMIDE 10 MG/ML IJ SOLN
60.0000 mg | Freq: Once | INTRAMUSCULAR | Status: AC
  Administered 2019-03-13: 60 mg via INTRAVENOUS
  Filled 2019-03-13: qty 6

## 2019-03-13 NOTE — Progress Notes (Signed)
[facility], [place] 03/13/19  Subjective:   LOS: 4 06/05 0701 - 06/06 0700 In: 720 [P.O.:720] Out: 774 [Urine:475]  Patient presented with c/o swelling and SOB, generalized weakness  Subjective fever at home Urine culture positive for UTI with E. Coli  Looks and feels better this morning.  Able to eat without nausea or vomiting. Sodium level noted to be slightly lower at 127 Continues to have lower extremity edema  Objective:  Vital signs in last 24 hours:  Temp:  [97.4 F (36.3 C)-98.5 F (36.9 C)] 97.4 F (36.3 C) (06/06 0749) Pulse Rate:  [87-96] 89 (06/06 0749) Resp:  [18-20] 19 (06/06 0749) BP: (89-109)/(63-74) 104/72 (06/06 0749) SpO2:  [98 %-100 %] 98 % (06/06 0749) Weight:  [135.5 kg] 135.5 kg (06/06 0335)  Weight change: 2.313 kg Filed Weights   03/11/19 0152 03/12/19 0239 03/13/19 0335  Weight: 131.6 kg 133.2 kg 135.5 kg    Intake/Output:    Intake/Output Summary (Last 24 hours) at 03/13/2019 1516 Last data filed at 03/13/2019 1350 Gross per 24 hour  Intake 960 ml  Output 200 ml  Net 760 ml    General  no distress, appears stated age, sitting up in recliner chair Head:   Normocephalic, without obvious abnormality, atraumatic Eyes/ENT:  conjunctiva/corneas clear,  moist oral mucus membranes Neck:  Supple,  thyroid: not enlarged, no JVD Lungs:    respirations unlabored, coarse breath sounds at bases Heart:   No rub or gallop , irregular  abdomen:   Soft, non-tender  Extremities: ++ edema Skin:  Skin color, texture, turgor normal, no rashes or lesions Neurologic: Alert and oriented.    Basic Metabolic Panel:  Recent Labs  Lab 03/09/19 2054 03/10/19 0527 03/11/19 0313 03/12/19 0428 03/13/19 0306  NA 133* 133* 132* 132* 127*  K 3.8 3.9 4.5 3.8 3.8  CL 91* 94* 93* 93* 90*  CO2 25 22 23 26 24   GLUCOSE 256* 283* 215* 153* 175*  BUN 78* 79* 90* 95* 99*  CREATININE 2.08* 2.23* 2.56* 2.54* 2.27*  CALCIUM 9.3 8.6* 8.4*  8.5* 8.1*     CBC: Recent Labs  Lab 03/09/19 2054 03/10/19 0527  WBC 9.2 10.6*  NEUTROABS 7.8*  --   HGB 12.0 11.4*  HCT 37.4 35.6*  MCV 85.2 85.6  PLT 234 175     No results found for: HEPBSAG, HEPBSAB, HEPBIGM    Microbiology:  Recent Results (from the past 240 hour(s))  Blood Culture (routine x 2)     Status: None (Preliminary result)   Collection Time: 03/09/19  8:54 PM  Result Value Ref Range Status   Specimen Description BLOOD LEFT ASSIST CONTROL  Final   Special Requests   Final    BOTTLES DRAWN AEROBIC AND ANAEROBIC Blood Culture adequate volume   Culture   Final    NO GROWTH 4 DAYS Performed at [facility], [address] [place], [place] [address]    Report Status PENDING  Incomplete  Urine culture     Status: Abnormal   Collection Time: 03/09/19  8:54 PM  Result Value Ref Range Status   Specimen Description   Final    URINE, RANDOM Performed at [facility], [address] [place], [place] [address]    Special Requests   Final    NONE Performed at [facility], [address], [place], [place] [address]    Culture >=100,000 COLONIES/mL ESCHERICHIA COLI (A)  Final   Report Status 03/12/2019  FINAL  Final   Organism ID, Bacteria ESCHERICHIA COLI (A)  Final      Susceptibility   Escherichia coli - MIC*    AMPICILLIN >=32 RESISTANT Resistant     CEFAZOLIN 8 SENSITIVE Sensitive     CEFTRIAXONE <=1 SENSITIVE Sensitive     CIPROFLOXACIN <=0.25 SENSITIVE Sensitive     GENTAMICIN <=1 SENSITIVE Sensitive     IMIPENEM <=0.25 SENSITIVE Sensitive     NITROFURANTOIN <=16 SENSITIVE Sensitive     TRIMETH/SULFA >=320 RESISTANT Resistant     AMPICILLIN/SULBACTAM >=32 RESISTANT Resistant     PIP/TAZO 64 INTERMEDIATE Intermediate     Extended ESBL NEGATIVE Sensitive     * >=100,000 COLONIES/mL ESCHERICHIA COLI  Blood Culture (routine x 2)     Status: None (Preliminary result)   Collection Time: 03/09/19  9:13 PM  Result Value Ref  Range Status   Specimen Description BLOOD RIGHT ANTECUBITAL  Final   Special Requests   Final    BOTTLES DRAWN AEROBIC AND ANAEROBIC Blood Culture results may not be optimal due to an excessive volume of blood received in culture bottles   Culture   Final    NO GROWTH 4 DAYS Performed at [facility], [address], [place], [place] [address]    Report Status PENDING  Incomplete  SARS Coronavirus 2 (CEPHEID - Performed in [place] hospital lab), Hosp Order     Status: None   Collection Time: 03/09/19  9:13 PM  Result Value Ref Range Status   SARS Coronavirus 2 NEGATIVE NEGATIVE Final    Comment: (NOTE) If result is NEGATIVE SARS-CoV-2 target nucleic acids are NOT DETECTED. The SARS-CoV-2 RNA is generally detectable in upper and lower  respiratory specimens during the acute phase of infection. The lowest  concentration of SARS-CoV-2 viral copies this assay can detect is 250  copies / mL. A negative result does not preclude SARS-CoV-2 infection  and should not be used as the sole basis for treatment or other  patient management decisions.  A negative result may occur with  improper specimen collection / handling, submission of specimen other  than nasopharyngeal swab, presence of viral mutation(s) within the  areas targeted by this assay, and inadequate number of viral copies  (<250 copies / mL). A negative result must be combined with clinical  observations, patient history, and epidemiological information. If result is POSITIVE SARS-CoV-2 target nucleic acids are DETECTED. The SARS-CoV-2 RNA is generally detectable in upper and lower  respiratory specimens dur ing the acute phase of infection.  Positive  results are indicative of active infection with SARS-CoV-2.  Clinical  correlation with patient history and other diagnostic information is  necessary to determine patient infection status.  Positive results do  not rule out bacterial infection or co-infection with  other viruses. If result is PRESUMPTIVE POSTIVE SARS-CoV-2 nucleic acids MAY BE PRESENT.   A presumptive positive result was obtained on the submitted specimen  and confirmed on repeat testing.  While 2019 novel coronavirus  (SARS-CoV-2) nucleic acids may be present in the submitted sample  additional confirmatory testing may be necessary for epidemiological  and / or clinical management purposes  to differentiate between  SARS-CoV-2 and other Sarbecovirus currently known to infect humans.  If clinically indicated additional testing with an alternate test  methodology [phone]) is advised. The SARS-CoV-2 RNA is generally  detectable in upper and lower respiratory sp ecimens during the acute  phase of infection. The expected result is Negative. Fact Sheet for  Patients:  StrictlyIdeas.no Fact Sheet for Healthcare Providers: BankingDealers.co.za This test is not yet approved or cleared by the [place] FDA and has been authorized for detection and/or diagnosis of SARS-CoV-2 by FDA under an Emergency Use Authorization (EUA).  This EUA will remain in effect (meaning this test can be used) for the duration of the COVID-19 declaration under Section 564(b)(1) of the Act, 21 U.S.C. section 360bbb-3(b)(1), unless the authorization is terminated or revoked sooner. Performed at [facility], [address] [place], [place] [address]     Coagulation Studies: No results for input(s): LABPROT, INR in the last 72 hours.  Urinalysis: No results for input(s): COLORURINE, LABSPEC, PHURINE, GLUCOSEU, HGBUR, BILIRUBINUR, KETONESUR, PROTEINUR, UROBILINOGEN, NITRITE, LEUKOCYTESUR in the last 72 hours.  Invalid input(s): APPERANCEUR    Imaging: No results found.   Medications:    . aspirin EC  81 mg Oral Daily  . cephALEXin  500 mg Oral TID  . clopidogrel  75 mg Oral Daily  . heparin  5,000 Units Subcutaneous Q8H  . insulin aspart   0-5 Units Subcutaneous QHS  . insulin aspart  0-9 Units Subcutaneous TID WC  . insulin glargine  14 Units Subcutaneous QHS  . mouth rinse  15 mL Mouth Rinse BID  . rosuvastatin  40 mg Oral Daily   acetaminophen **OR** acetaminophen, morphine injection, ondansetron **OR** ondansetron (ZOFRAN) IV, traMADol  Assessment/ Plan:  63 y.o. female with with insulin dependent diabetes mellitus type 2, diabetic neuropathy, hypertension, anemia, obstructive sleep apnea, positive ANA, hyperlipidemia, systolic congestive heart failure status post AICD placement- removed in 12/2018, coronary artery disease with stent  Principal Problem:   Severe sepsis ([place]) Active Problems:   Pure hypercholesterolemia   Essential hypertension, benign   OSA (obstructive sleep apnea)   Paroxysmal atrial fibrillation (H[facility])   Acute on chronic combined systolic and diastolic CHF (congestive heart failure) ([place])   UTI (urinary tract infection)   CAD (coronary artery disease)   Diabetes ([place])   #. ARF on CKD st 3 Baseline Creatinine 1.58/GFR 34 from 02/21/2019 Recent Labs    03/10/19 0527 03/11/19 0313 03/12/19 0428 03/13/19 0306  CREATININE 2.23* 2.56* 2.54* 2.27*  AKI may be related to concurrent infection/sepsis - continue treatment of underlying infection -Serum creatinine started to improve  #. Anemia of CKD  Lab Results  Component Value Date   HGB 11.4 (L) 03/10/2019  continue to monitor closely  #. Bone and mineral metabolism  No results found for: PTH Lab Results  Component Value Date   PHOS 4.4 11/23/2018    #. UTI - iv rocephin-.  Oral Keflex  -E. coli  #. Diabetes type 2 with CKD Hgb A1c MFr Bld (%)  Date Value  02/03/2019 5.6   #Hyponatremia -May be related to diuresis -We will consider changing to oral torsemide tomorrow   LOS: [place] 6/6/20203:16 PM  [place], [place]

## 2019-03-13 NOTE — Progress Notes (Signed)
Patient states she doesn't need anyone in her family updated via phone today. Will continue to monitor. [name] [facility]

## 2019-03-13 NOTE — Progress Notes (Signed)
Progress Note  Patient Name: [name] Date of Encounter: 03/13/2019  Primary Cardiologist: Ida Rogue, MD , [facility]  Subjective   Doing improved from yesterday.  She says that she has not peed quite as much as she feels like she should.  She remains volume overloaded.  No longer having chest pain.  Inpatient Medications    Scheduled Meds: . aspirin EC  81 mg Oral Daily  . cephALEXin  500 mg Oral TID  . clopidogrel  75 mg Oral Daily  . heparin  5,000 Units Subcutaneous Q8H  . insulin aspart  0-5 Units Subcutaneous QHS  . insulin aspart  0-9 Units Subcutaneous TID WC  . insulin glargine  14 Units Subcutaneous QHS  . mouth rinse  15 mL Mouth Rinse BID  . rosuvastatin  40 mg Oral Daily   Continuous Infusions:  PRN Meds: acetaminophen **OR** acetaminophen, morphine injection, ondansetron **OR** ondansetron (ZOFRAN) IV, traMADol   Vital Signs    Vitals:   03/13/19 0009 03/13/19 0333 03/13/19 0335 03/13/19 0749  BP: 106/74 109/67  104/72  Pulse: 95 93  89  Resp:  20  19  Temp:  98.5 F (36.9 C)  (!) 97.4 F (36.3 C)  TempSrc:  Oral  Oral  SpO2:  98%  98%  Weight:   135.5 kg   Height:        Intake/Output Summary (Last 24 hours) at 03/13/2019 1032 Last data filed at 03/13/2019 1003 Gross per 24 hour  Intake 1200 ml  Output 200 ml  Net 1000 ml   Last 3 Weights 03/13/2019 03/12/2019 03/11/2019  Weight (lbs) 298 lb 12.8 oz 293 lb 11.2 oz 290 lb 3.2 oz  Weight (kg) 135.535 kg 133.221 kg 131.634 kg  Some encounter information is confidential and restricted. Go to Review Flowsheets activity to see all data.      Telemetry    Sinus rhythm, PVCs- Personally Reviewed  ECG    Sinus rhythm, left bundle branch block- Personally Reviewed  Physical Exam   GEN: Well nourished, well developed, in no acute distress  HEENT: normal  Neck: no JVD, carotid bruits, or masses Cardiac: RRR; no murmurs, rubs, or gallops,no edema  Respiratory:  clear to auscultation  bilaterally, normal work of breathing GI: soft, nontender, nondistended, + BS MS: no deformity or atrophy  Skin: warm and dry Neuro:  Strength and sensation are intact Psych: euthymic mood, full affect   Labs    Chemistry Recent Labs  Lab 03/09/19 2054  03/11/19 0313 03/12/19 0428 03/13/19 0306  NA 133*   < > 132* 132* 127*  K 3.8   < > 4.5 3.8 3.8  CL 91*   < > 93* 93* 90*  CO2 25   < > 23 26 24   GLUCOSE 256*   < > 215* 153* 175*  BUN 78*   < > 90* 95* 99*  CREATININE 2.08*   < > 2.56* 2.54* 2.27*  CALCIUM 9.3   < > 8.4* 8.5* 8.1*  PROT 7.7  --   --   --   --   ALBUMIN 4.0  --   --   --   --   AST 24  --   --   --   --   ALT 12  --   --   --   --   ALKPHOS 63  --   --   --   --   BILITOT 1.5*  --   --   --   --  GFRNONAA 24*   < > 19* 19* 22*  GFRAA 28*   < > 22* 22* 25*  ANIONGAP 17*   < > 16* 13 13   < > = values in this interval not displayed.     Hematology Recent Labs  Lab 03/09/19 2054 03/10/19 0527  WBC 9.2 10.6*  RBC 4.39 4.16  HGB 12.0 11.4*  HCT 37.4 35.6*  MCV 85.2 85.6  MCH 27.3 27.4  MCHC 32.1 32.0  RDW 14.8 14.9  PLT 234 175    Cardiac Enzymes Recent Labs  Lab 03/10/19 0527 03/12/19 1500 03/12/19 2043 03/13/19 0306  TROPONINI 0.27* 0.06* 0.07* 0.09*   No results for input(s): TROPIPOC in the last 168 hours.   BNPNo results for input(s): BNP, PROBNP in the last 168 hours.   DDimer No results for input(s): DDIMER in the last 168 hours.   Radiology    No results found.  Cardiac Studies     Patient Profile     63 year old woman with history of severe coronary artery disease, diabetes type 2, morbid obesity, cardiomyopathy ejection fraction 40%, MSSA bacteremia with removal of ICD at [facility] presenting with sepsis, UTI, encephalopathy elevated troponin  Assessment & Plan    Elevated troponin/stable angina Demand ischemia on arrival in the setting of sepsis, UTI, tachycardia on arrival Chest pain likely due to demand ischemia  from tachycardia.  She is no longer been tachycardic based on telemetry.  Chest pain is much improved.  No indication for left heart catheterization at this time.    History of pulmonary edema, systolic and diastolic CHF She reports fullness in her abdomen consistent with prior CHF symptoms Net +2.1 L.  Fortunately her creatinine is continuing to improve.  I do think that she needs diuresis.  Continue with 40 mg of IV Lasix.  She could potentially need further increase in her Lasix dose.  Acute on chronic kidney disease Due to baseline renal dysfunction and ATN from UTI  Encephalopathy  since resolved with antibiotics.  Likely due to urinary tract infection.  Plan per primary team.  Significant time spent at the bedside in the setting of chest pain this afternoon, discussed with hospitalist service, nursing  Total encounter time more than 45 minutes  Greater than 50% was spent in counseling and coordination of care with the patient   For questions or updates, please contact [place] Please consult [email or web address] for contact info under        Signed, Shoua Ulloa Meredith Leeds, MD  03/13/2019, 10:32 AM

## 2019-03-13 NOTE — Progress Notes (Signed)
[facility] [place] NAME: [name]    MR#:  [id number]  DATE OF BIRTH:  Apr 19, 1956  SUBJECTIVE:  -Appears much better today.  Renal function is still worse.  -out in the chair REVIEW OF SYSTEMS:  Review of Systems  Constitutional: Positive for malaise/fatigue. Negative for chills and fever.  HENT: Negative for congestion, ear discharge, hearing loss and nosebleeds.   Eyes: Negative for blurred vision and double vision.  Respiratory: Positive for shortness of breath. Negative for cough and wheezing.   Cardiovascular: Positive for leg swelling. Negative for chest pain and palpitations.  Gastrointestinal: Negative for abdominal pain, constipation, diarrhea, nausea and vomiting.  Genitourinary: Negative for dysuria.  Musculoskeletal: Positive for myalgias.  Neurological: Negative for dizziness, focal weakness, seizures, weakness and headaches.    DRUG ALLERGIES:  No Known Allergies  VITALS:  Blood pressure 104/72, pulse 89, temperature (!) 97.4 F (36.3 C), temperature source Oral, resp. rate 19, height 5\' 8"  (1.727 m), weight 135.5 kg, SpO2 98 %.  PHYSICAL EXAMINATION:  Physical Exam   GENERAL:  63 y.o.-year-old patient lying in the bed with no acute distress.  EYES: Pupils equal, round, reactive to light and accommodation. No scleral icterus. Extraocular muscles intact.  HEENT: Head atraumatic, normocephalic. Oropharynx and nasopharynx clear.  NECK:  Supple, no jugular venous distention. No thyroid enlargement, no tenderness.  LUNGS: Normal breath sounds bilaterally, no wheezing, rales,rhonchi or crepitation. No use of accessory muscles of respiration.  CARDIOVASCULAR: S1, S2 normal. No rubs, or gallops. 2/6 systolic murmur present External ICD placed on chest, noted on left chest ABDOMEN: Soft, nontender, nondistended. Bowel sounds present. No organomegaly or mass.  EXTREMITIES: No cyanosis, or clubbing. 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   overall weakness noted. Muscle strength equal  in all extremities. Sensation intact. Gait not checked.  PSYCHIATRIC: patient is alert  and oriented x 3  SKIN: No obvious rash, lesion, or ulcer.    LABORATORY PANEL:   CBC Recent Labs  Lab 03/10/19 0527  WBC 10.6*  HGB 11.4*  HCT 35.6*  PLT 175   ------------------------------------------------------------------------------------------------------------------  Chemistries  Recent Labs  Lab 03/09/19 2054  03/13/19 0306  NA 133*   < > 127*  K 3.8   < > 3.8  CL 91*   < > 90*  CO2 25   < > 24  GLUCOSE 256*   < > 175*  BUN 78*   < > 99*  CREATININE 2.08*   < > 2.27*  CALCIUM 9.3   < > 8.1*  AST 24  --   --   ALT 12  --   --   ALKPHOS 63  --   --   BILITOT 1.5*  --   --    < > = values in this interval not displayed.   ------------------------------------------------------------------------------------------------------------------  Cardiac Enzymes Recent Labs  Lab 03/13/19 0306  TROPONINI 0.09*   ------------------------------------------------------------------------------------------------------------------  RADIOLOGY:  No results found.  EKG:   Orders placed or performed during the hospital encounter of 03/09/19  . ED EKG  . ED EKG  . ED EKG 12-Lead  . ED EKG 12-Lead  . EKG 12-Lead  . EKG 12-Lead  . EKG 12-Lead  . EKG 12-Lead  . EKG 12-Lead  . EKG 12-Lead  . EKG 12-Lead  . EKG 12-Lead  . EKG 12-Lead  . EKG 12-Lead  . EKG  12-Lead  . 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  1.  Acute encephalopathy- secondary to UTI and sleep apnea - resolved now, improving Continue CPAP at bedtime  2. ARF on CKD stage3- Baseline creatinine is at 1.8, creatinine elevated, however was on torsemide for CHF -Diuretics resumed by cardiology -Nephrology has been consulted.  Hold nephrotoxins -Creatinine at 2.2 and stable today.    3.  Chronic systolic CHF -echocardiogram with EF of 40 to 45% -Lasix 60 mg IV x 1 today per cardiology -  Monitor I's and O's.  Still has some pedal edema -Daily weights.  Monitor renal function as it is worse than baseline -Cardiology has been consulted  4.  Sepsis-secondary to acute cystitis.  - Patient has had history of MSSA bacteremia.  Blood cultures now negative.  - Urine cultures growing E. coli.   -cont Keflex for 2 more days.  Likely can be stopped at discharge.  5.  Diabetes mellitus-on Lantus and sliding scale insulin  6.  CAD-stable.  Troponin elevated, from demand ischemia.  On aspirin and Plavix  7.  DVT prophylaxis-subcu heparin  Physical therapy consulted-recommended home health at discharge. -Discharge in 1 to 2 days as renal function improves.    All the records are reviewed and case discussed with Care Management/Social Workerr. Management plans discussed with the patient, family and they are in agreement.  CODE STATUS: Full Code  TOTAL TIME TAKING CARE OF THIS PATIENT: 37 minutes.   POSSIBLE D/C IN 1-2 DAYS, DEPENDING ON CLINICAL CONDITION.   Fritzi Mandes M.D on 03/13/2019 at 1:31 PM  Between 7am to 6pm - Pager - [phone]  After 6pm go to [email or web address] - password [email or web address] [facility]  Office  [phone]  CC: Primary care physician; Rutherford Guys, MD

## 2019-03-14 LAB — CULTURE, BLOOD (ROUTINE X 2)
Culture: NO GROWTH
Culture: NO GROWTH
Special Requests: ADEQUATE

## 2019-03-14 LAB — GLUCOSE, CAPILLARY
Glucose-Capillary: 125 mg/dL — ABNORMAL HIGH (ref 70–99)
Glucose-Capillary: 210 mg/dL — ABNORMAL HIGH (ref 70–99)
Glucose-Capillary: 195 mg/dL — ABNORMAL HIGH (ref 70–99)
Glucose-Capillary: 165 mg/dL — ABNORMAL HIGH (ref 70–99)

## 2019-03-14 MED ORDER — FUROSEMIDE 10 MG/ML IJ SOLN
40.0000 mg | Freq: Once | INTRAMUSCULAR | Status: AC
  Administered 2019-03-14: 40 mg via INTRAVENOUS
  Filled 2019-03-14: qty 4

## 2019-03-14 MED ORDER — DOCUSATE SODIUM 100 MG PO CAPS
200.0000 mg | ORAL_CAPSULE | Freq: Every day | ORAL | Status: DC | PRN
  Administered 2019-03-14: 200 mg via ORAL
  Filled 2019-03-14: qty 2

## 2019-03-14 MED ORDER — METOPROLOL TARTRATE 5 MG/5ML IV SOLN
5.0000 mg | Freq: Once | INTRAVENOUS | Status: AC
  Administered 2019-03-14: 5 mg via INTRAVENOUS
  Filled 2019-03-14: qty 5

## 2019-03-14 MED ORDER — METOPROLOL TARTRATE 25 MG PO TABS
25.0000 mg | ORAL_TABLET | Freq: Two times a day (BID) | ORAL | Status: DC
  Administered 2019-03-14 – 2019-03-15 (×3): 25 mg via ORAL
  Filled 2019-03-14 (×3): qty 1

## 2019-03-14 NOTE — Progress Notes (Signed)
[facility] [place] NAME: [name]    MR#:  [id number]  DATE OF BIRTH:  1956/03/04  SUBJECTIVE:  -episode of tachycardia today. Received IV Lasix 40 mg times one. Appears fatigued. Did not sleep well last night. REVIEW OF SYSTEMS:  Review of Systems  Constitutional: Positive for malaise/fatigue. Negative for chills and fever.  HENT: Negative for congestion, ear discharge, hearing loss and nosebleeds.   Eyes: Negative for blurred vision and double vision.  Respiratory: Positive for shortness of breath. Negative for cough and wheezing.   Cardiovascular: Positive for leg swelling. Negative for chest pain and palpitations.  Gastrointestinal: Negative for abdominal pain, constipation, diarrhea, nausea and vomiting.  Genitourinary: Negative for dysuria.  Musculoskeletal: Positive for myalgias.  Neurological: Negative for dizziness, focal weakness, seizures, weakness and headaches.    DRUG ALLERGIES:  No Known Allergies  VITALS:  Blood pressure 110/77, pulse 86, temperature (!) 97.5 F (36.4 C), temperature source Oral, resp. rate 16, height 5\' 8"  (1.727 m), weight 133.5 kg, SpO2 95 %.  PHYSICAL EXAMINATION:  Physical Exam   GENERAL:  63 y.o.-year-old patient lying in the bed with no acute distress.  EYES: Pupils equal, round, reactive to light and accommodation. No scleral icterus. Extraocular muscles intact.  HEENT: Head atraumatic, normocephalic. Oropharynx and nasopharynx clear.  NECK:  Supple, no jugular venous distention. No thyroid enlargement, no tenderness.  LUNGS: Normal breath sounds bilaterally, no wheezing, rales,rhonchi or crepitation. No use of accessory muscles of respiration.  CARDIOVASCULAR: S1, S2 normal. No rubs, or gallops. 2/6 systolic murmur present External ICD placed on chest, noted on left chest ABDOMEN: Soft, nontender, nondistended. Bowel sounds present. No organomegaly or mass.  EXTREMITIES: No cyanosis,  or clubbing. 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   overall weakness noted. Muscle strength equal  in all extremities. Sensation intact. Gait not checked.  PSYCHIATRIC: patient is alert  and oriented x 3  SKIN: No obvious rash, lesion, or ulcer.    LABORATORY PANEL:   CBC Recent Labs  Lab 03/10/19 0527  WBC 10.6*  HGB 11.4*  HCT 35.6*  PLT 175   ------------------------------------------------------------------------------------------------------------------  Chemistries  Recent Labs  Lab 03/09/19 2054  03/13/19 0306  NA 133*   < > 127*  K 3.8   < > 3.8  CL 91*   < > 90*  CO2 25   < > 24  GLUCOSE 256*   < > 175*  BUN 78*   < > 99*  CREATININE 2.08*   < > 2.27*  CALCIUM 9.3   < > 8.1*  AST 24  --   --   ALT 12  --   --   ALKPHOS 63  --   --   BILITOT 1.5*  --   --    < > = values in this interval not displayed.   ------------------------------------------------------------------------------------------------------------------  Cardiac Enzymes Recent Labs  Lab 03/13/19 0306  TROPONINI 0.09*   ------------------------------------------------------------------------------------------------------------------  RADIOLOGY:  No results found.  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  1. Acute encephalopathy- secondary to UTI and sleep apnea - resolved now, improving Continue CPAP at bedtime  2. ARF on CKD stage3- Baseline creatinine is at 1.8, creatinine elevated, however was on torsemide for CHF -Diuretics resumed by cardiology -Nephrology has been consulted.  Hold nephrotoxins -Creatinine at 2.2 and stable today.    3.  Chronic systolic CHF -echocardiogram with EF of 40 to 45% -Lasix 40 mg IV x 1 today per cardiology -  Monitor I's and O's.  Still has some pedal edema although pt feels much better with edema -baseline Daily weights.    4.  Sepsis-secondary to acute cystitis.  - Patient has had history of MSSA bacteremia.  Blood cultures now negative.  - Urine cultures growing E. coli.   -cont Keflex -Likely can be stopped at discharge.  5.  Diabetes mellitus-on Lantus and sliding scale insulin  6.  CAD-stable.  Troponin elevated, from demand ischemia.  On aspirin and Plavix  7.  DVT prophylaxis-subcu heparin  Physical therapy consulted-recommended home health at discharge. -Discharge in 1 to 2 days as renal function improves.    All the records are reviewed and case discussed with Care Management/Social Workerr. Management plans discussed with the patient, family and they are in agreement.  CODE STATUS: Full Code  TOTAL TIME TAKING CARE OF THIS PATIENT: 37 minutes.   POSSIBLE D/C IN 1-2 DAYS, DEPENDING ON CLINICAL CONDITION.   Fritzi Mandes M.D on 03/14/2019 at 2:26 PM  Between 7am to 6pm - Pager - [phone]  After 6pm go to [email or web address] - password [email or web address] [facility]  Office  [phone]  CC: Primary care physician; Rutherford Guys, MD

## 2019-03-14 NOTE — Progress Notes (Signed)
Progress Note  Patient Name: [name] Date of Encounter: 03/14/2019  Primary Cardiologist: Ida Rogue, MD , [facility]  Subjective   Feeling better than yesterday.  Feels like her swelling has been improving.  She did have an episode of tachycardia earlier today.  Inpatient Medications    Scheduled Meds: . aspirin EC  81 mg Oral Daily  . cephALEXin  500 mg Oral TID  . clopidogrel  75 mg Oral Daily  . heparin  5,000 Units Subcutaneous Q8H  . insulin aspart  0-5 Units Subcutaneous QHS  . insulin aspart  0-9 Units Subcutaneous TID WC  . insulin glargine  14 Units Subcutaneous QHS  . mouth rinse  15 mL Mouth Rinse BID  . rosuvastatin  40 mg Oral Daily   Continuous Infusions:  PRN Meds: acetaminophen **OR** acetaminophen, morphine injection, ondansetron **OR** ondansetron (ZOFRAN) IV, traMADol   Vital Signs    Vitals:   03/13/19 1621 03/13/19 2012 03/14/19 0439 03/14/19 0724  BP: 108/71 100/74 110/77 110/77  Pulse: 90 88 90 86  Resp: 19 20 20 16   Temp: 97.7 F (36.5 C) (!) 97.5 F (36.4 C) 98.2 F (36.8 C) (!) 97.5 F (36.4 C)  TempSrc: Oral Oral Oral Oral  SpO2: 98% 95% 98% 95%  Weight:   133.5 kg   Height:        Intake/Output Summary (Last 24 hours) at 03/14/2019 1300 Last data filed at 03/14/2019 0940 Gross per 24 hour  Intake 960 ml  Output 600 ml  Net 360 ml   Last 3 Weights 03/14/2019 03/13/2019 03/12/2019  Weight (lbs) 294 lb 6.4 oz 298 lb 12.8 oz 293 lb 11.2 oz  Weight (kg) 133.539 kg 135.535 kg 133.221 kg  Some encounter information is confidential and restricted. Go to Review Flowsheets activity to see all data.      Telemetry    Sinus rhythm, intermittent episodes of likely atrial tachycardia- Personally Reviewed  ECG    None new- Personally Reviewed  Physical Exam   GEN: Well nourished, well developed, in no acute distress  HEENT: normal  Neck: no JVD, carotid bruits, or masses Cardiac: RRR; no murmurs, rubs, or gallops, 2+  edema  Respiratory:  clear to auscultation bilaterally, normal work of breathing GI: soft, nontender, nondistended, + BS MS: no deformity or atrophy  Skin: warm and dry Neuro:  Strength and sensation are intact Psych: euthymic mood, full affect    Labs    Chemistry Recent Labs  Lab 03/09/19 2054  03/11/19 0313 03/12/19 0428 03/13/19 0306  NA 133*   < > 132* 132* 127*  K 3.8   < > 4.5 3.8 3.8  CL 91*   < > 93* 93* 90*  CO2 25   < > 23 26 24   GLUCOSE 256*   < > 215* 153* 175*  BUN 78*   < > 90* 95* 99*  CREATININE 2.08*   < > 2.56* 2.54* 2.27*  CALCIUM 9.3   < > 8.4* 8.5* 8.1*  PROT 7.7  --   --   --   --   ALBUMIN 4.0  --   --   --   --   AST 24  --   --   --   --   ALT 12  --   --   --   --   ALKPHOS 63  --   --   --   --   BILITOT 1.5*  --   --   --   --  GFRNONAA 24*   < > 19* 19* 22*  GFRAA 28*   < > 22* 22* 25*  ANIONGAP 17*   < > 16* 13 13   < > = values in this interval not displayed.     Hematology Recent Labs  Lab 03/09/19 2054 03/10/19 0527  WBC 9.2 10.6*  RBC 4.39 4.16  HGB 12.0 11.4*  HCT 37.4 35.6*  MCV 85.2 85.6  MCH 27.3 27.4  MCHC 32.1 32.0  RDW 14.8 14.9  PLT 234 175    Cardiac Enzymes Recent Labs  Lab 03/10/19 0527 03/12/19 1500 03/12/19 2043 03/13/19 0306  TROPONINI 0.27* 0.06* 0.07* 0.09*   No results for input(s): TROPIPOC in the last 168 hours.   BNPNo results for input(s): BNP, PROBNP in the last 168 hours.   DDimer No results for input(s): DDIMER in the last 168 hours.   Radiology    No results found.  Cardiac Studies     Patient Profile     63 year old woman with history of severe coronary artery disease, diabetes type 2, morbid obesity, cardiomyopathy ejection fraction 40%, MSSA bacteremia with removal of ICD at [facility] presenting with sepsis, UTI, encephalopathy elevated troponin  Assessment & Plan    Elevated troponin/stable angina Demand ischemia on arrival in the setting of sepsis, UTI, tachycardia on  arrival Again had chest pain when she was tachycardic earlier today.  Tachycardia appeared to be due to atrial tachycardia.  No indication for heart catheterization at this time.  Likely due to demand ischemia.    History of pulmonary edema, systolic and diastolic CHF She reports fullness in her abdomen consistent with prior CHF symptoms Continues to be net positive.   need further aggressive diuresis.  Creatinine was improved from yesterday but has not been checked today.   re-dose with Lasix.  Acute on chronic kidney disease Due to renal dysfunction and ATN.  Encephalopathy  since resolved    For questions or updates, please contact [place] Please consult [email or web address] for contact info under        Signed,  Meredith Leeds, MD  03/14/2019, 1:00 PM

## 2019-03-14 NOTE — Progress Notes (Signed)
Patient states she has already updated her family member for today via phone. [name] [facility]

## 2019-03-15 ENCOUNTER — Telehealth: Payer: Self-pay | Admitting: *Deleted

## 2019-03-15 DIAGNOSIS — I2510 Atherosclerotic heart disease of native coronary artery without angina pectoris: Secondary | ICD-10-CM

## 2019-03-15 DIAGNOSIS — Z79899 Other long term (current) drug therapy: Secondary | ICD-10-CM

## 2019-03-15 DIAGNOSIS — I5042 Chronic combined systolic (congestive) and diastolic (congestive) heart failure: Secondary | ICD-10-CM

## 2019-03-15 LAB — GLUCOSE, CAPILLARY: Glucose-Capillary: 135 mg/dL — ABNORMAL HIGH (ref 70–99)

## 2019-03-15 LAB — BASIC METABOLIC PANEL
Anion gap: 14 (ref 5–15)
BUN: 97 mg/dL — ABNORMAL HIGH (ref 8–23)
CO2: 26 mmol/L (ref 22–32)
Calcium: 9.1 mg/dL (ref 8.9–10.3)
Chloride: 90 mmol/L — ABNORMAL LOW (ref 98–111)
Creatinine, Ser: 1.81 mg/dL — ABNORMAL HIGH (ref 0.44–1.00)
GFR calc Af Amer: 33 mL/min — ABNORMAL LOW (ref >60)
GFR calc non Af Amer: 29 mL/min — ABNORMAL LOW (ref >60)
Glucose, Bld: 142 mg/dL — ABNORMAL HIGH (ref 70–99)
Potassium: 4.2 mmol/L (ref 3.5–5.1)
Sodium: 130 mmol/L — ABNORMAL LOW (ref 135–145)

## 2019-03-15 MED ORDER — TORSEMIDE 20 MG PO TABS
40.0000 mg | ORAL_TABLET | Freq: Two times a day (BID) | ORAL | Status: DC
  Administered 2019-03-15: 40 mg via ORAL
  Filled 2019-03-15: qty 2

## 2019-03-15 MED ORDER — METOPROLOL TARTRATE 25 MG PO TABS: 25.0000 mg | ORAL_TABLET | Freq: Two times a day (BID) | ORAL | 0 refills | Status: DC

## 2019-03-15 NOTE — TOC Transition Note (Addendum)
Transition of Care [facility]) - CM/SW Discharge Note   Patient Details  Name: [name] MRN: [id number] Date of Birth: 1956/09/15  Transition of Care [facility]) CM/SW Contact:  Ross Ludwig, LCSW Phone Number: 03/15/2019, 9:33 AM   Clinical Narrative:     Patient will be discharging back home, patient states she does not need a walker, and has everything at home.  Patient will be followed by home health PT and nursing through [facility].  Patient stated her family will be picking her up later today.  No other issues or concerns.  CSW updated [place].   Final next level of care: [place] Barriers to Discharge: Barriers Resolved   Patient Goals and CMS Choice Patient states their goals for this hospitalization and ongoing recovery are:: Return home with [place] CMS Medicare.gov Compare Post Acute Care list provided to:: Patient Choice offered to / list presented to : Patient  Discharge Placement               Patient discharging back home with home health PT and nursing.        Discharge Plan and Services In-house Referral: [facility] Discharge Planning Services: CM Consult, HF Clinic            DME Arranged: N/A DME Agency: NA       HH Arranged: RN, PT [place] Agency: [place] ([facility]) Date HH Agency Contacted: 03/15/19 Time [place]: [phone] Representative spoke with at [place]: [place] ([place]) Interventions     Readmission Risk Interventions Readmission Risk Prevention Plan 02/18/2019  Transportation Screening Complete  Medication Review Press photographer) Complete  PCP or Specialist appointment within 3-5 days of discharge Complete  HRI or [place] Complete  SW Recovery Care/Counseling Consult Patient refused  Palliative Care Screening Not [place] Not Applicable  Some recent data might be hidden

## 2019-03-15 NOTE — Progress Notes (Signed)
Progress Note  Patient Name: [name] Date of Encounter: 03/15/2019  Primary Cardiologist: Ida Rogue, MD   Subjective   Does not yet feel that back to her baseline. Of note, does not use oxygen at home. Still feels as if she has significant LEE and also c/o abdominal distention.  No recent chest pain, palpitations, or racing HR.   Inpatient Medications    Scheduled Meds: . aspirin EC  81 mg Oral Daily  . clopidogrel  75 mg Oral Daily  . heparin  5,000 Units Subcutaneous Q8H  . insulin aspart  0-5 Units Subcutaneous QHS  . insulin aspart  0-9 Units Subcutaneous TID WC  . insulin glargine  14 Units Subcutaneous QHS  . mouth rinse  15 mL Mouth Rinse BID  . metoprolol tartrate  25 mg Oral BID  . rosuvastatin  40 mg Oral Daily  . torsemide  40 mg Oral BID   Continuous Infusions:  PRN Meds: acetaminophen **OR** acetaminophen, docusate sodium, morphine injection, ondansetron **OR** ondansetron (ZOFRAN) IV, traMADol   Vital Signs    Vitals:   03/14/19 2000 03/15/19 0502 03/15/19 0504 03/15/19 0819  BP: 106/74  (!) 91/55 103/78  Pulse: 82  78 77  Resp: 20  20 18   Temp: 98.2 F (36.8 C)  98.2 F (36.8 C)   TempSrc: Oral  Oral   SpO2: 96%  96% 99%  Weight:  133.7 kg    Height:        Intake/Output Summary (Last 24 hours) at 03/15/2019 0843 Last data filed at 03/15/2019 0502 Gross per 24 hour  Intake 480 ml  Output 1100 ml  Net -620 ml   Filed Weights   03/13/19 0335 03/14/19 0439 03/15/19 0502  Weight: 135.5 kg 133.5 kg 133.7 kg    Telemetry    SR, earlier admission episodes of likely atrial tachycardia- Personally Reviewed  ECG    No new tracings - Personally Reviewed  Physical Exam   GEN: No acute distress.  Eating breakfast Neck: JVD difficult to assess d/t body habitus Cardiac: RRR, no murmurs, rubs, or gallops.  Respiratory: Bibasilar trace crackles. GI: Soft, nontender, non-distended  MS: 1-2+ LEE; No deformity. Neuro:  Nonfocal   Psych: Normal affect   Labs    Chemistry Recent Labs  Lab 03/09/19 2054  03/12/19 0428 03/13/19 0306 03/15/19 0553  NA 133*   < > 132* 127* 130*  K 3.8   < > 3.8 3.8 4.2  CL 91*   < > 93* 90* 90*  CO2 25   < > 26 24 26   GLUCOSE 256*   < > 153* 175* 142*  BUN 78*   < > 95* 99* 97*  CREATININE 2.08*   < > 2.54* 2.27* 1.81*  CALCIUM 9.3   < > 8.5* 8.1* 9.1  PROT 7.7  --   --   --   --   ALBUMIN 4.0  --   --   --   --   AST 24  --   --   --   --   ALT 12  --   --   --   --   ALKPHOS 63  --   --   --   --   BILITOT 1.5*  --   --   --   --   GFRNONAA 24*   < > 19* 22* 29*  GFRAA 28*   < > 22* 25* 33*  ANIONGAP 17*   < >  13 13 14    < > = values in this interval not displayed.     Hematology Recent Labs  Lab 03/09/19 2054 03/10/19 0527  WBC 9.2 10.6*  RBC 4.39 4.16  HGB 12.0 11.4*  HCT 37.4 35.6*  MCV 85.2 85.6  MCH 27.3 27.4  MCHC 32.1 32.0  RDW 14.8 14.9  PLT 234 175    Cardiac Enzymes Recent Labs  Lab 03/10/19 0527 03/12/19 1500 03/12/19 2043 03/13/19 0306  TROPONINI 0.27* 0.06* 0.07* 0.09*   No results for input(s): TROPIPOC in the last 168 hours.   BNPNo results for input(s): BNP, PROBNP in the last 168 hours.   DDimer No results for input(s): DDIMER in the last 168 hours.   Radiology    No results found.  Cardiac Studies    TTE 11/2018 1. The left ventricle has mild-moderately reduced systolic function, with an ejection fraction of 40-45%. The cavity size was mildly dilated. Left ventricular diastolic Doppler parameters are consistent with pseudonormalization. 2. The right ventricle has normal systolic function. The cavity was normal. There is no increase in right ventricular wall thickness. 3. Left atrial size was mildly dilated. 4. Right atrial size was mildly dilated. 5. The mitral valve is normal in structure. 6. The tricuspid valve is normal in structure. 7. The aortic valve is normal in structure. 8. The pulmonic valve was  normal in structure.  FINDINGS Left Ventricle: The left ventricle has mild-moderately reduced systolic function, with an ejection fraction of 40-45%. The cavity size was mildly dilated. There is no increase in left ventricular wall thickness. Left ventricular diastolic Doppler  parameters are consistent with pseudonormalization Right Ventricle: The right ventricle has normal systolic function. The cavity was normal. There is no increase in right ventricular wall thickness.   Patient Profile     63 y.o. female hx of CAD, known LBBB, mixed ischemic/nonischemic systolic and diastolic dysfunction status post CRT-D 2015 with improvement in EF to 40 to 45%, DM 2, HTN, obesity, OSA prescribed CPAP, NSTEMI 09/2018 and 2/2 MSSA bacteremia s/p ICD removal at [facility] and being seen today for who is being seen today for the evaluation of elevated troponin in the setting of UTI and sepsis.  Assessment & Plan    Chronic combined systolic and diastolic heart failure (EF 40-45%) -Still not yet at baseline status, evidence of volume overload on exam. --Wt 164.7kg  130.4kg  133.7kg (294.8lbs).  Dry weight noted to be 248lbs. Continue daily standing weights. +1359mL for the admission. -653mL yesterday. Continue strict I/O.  --Prior to admission, outpatient recommendation to decrease home torsemide 40mg  BID  torsemide 20mg  BID with KCl 10 mEq daily d/t renal function on outpatient BMET at that time.  --Diuresis held on admission d/t UTI / decreased renal function. Restarted diuresis this past weekend. --Continue IV lasix until euvolemic on exam with transition back to oral diuresis before discharge / KCl supplementation with dosing to be determined at discharge.  --Daily BMET. Renal function improving but still not yet at baseline. Baseline SCr ~1.6-1.7. Recent Cr 2.27    Cr 1.81.  BUN 99  97.  K 4.2. --Follow-up TCM after discharge with BMET to be scheduled at discharge.   UTI with sepsis - Per IM. Likely  contributing to her SOB and fatigue, as well as decreased renal function.  Atrial tachycardia -CP with episodes of atrial tachycardia this admission. Currently, HR in the 80s. -14-day ZIO patch was mailed to patient with follow-up scheduled with EP. -Metoprolol restarted with  resolution of infection. Up-titration limited by softer BP this admission.   Troponin elevation with h/o CAD involving the native coronary arteries without angina -No current CP. Reported CP earlier with episodes of tachycardia.  -Left heart cath 09/2018 with CTO of the LAD that is medically managed.  -Troponin minimally elevated and flat trending this admission in the setting of sepsis/UTI and tachycardic rates. EKG without acute changes. Suspect Tn elevation d/t supply demand ischemia.   -Continue medical management with aspirin, BB, Plavix, and Crestor.   -No plans for ischemic evaluation this admission.  Acute on chronic kidney disease III --As above, prior to admission, recommendation was to decrease home diuretic from torsemide 40mg  BID to torsemide 20mg  BID with KCl 31mEq daily d/t renal function on outpatient BMET. Diuresis held at admission d/t UTI with likely ATN. Restarted diuresis over the weekend. Continue IV lasix for now. Will transition back to oral diuresis / KCl supplementation at discharge with dosing to be determined at that time. Close monitoring of renal function and electrolytes with daily BMET during diuresis.  Hypertension --Controlled, soft BP this admission. --Metoprolol restarted with tachycardic rates. --Diuresis restarted as above. Will need transitioned to oral diuresis / KCl supplementation before discharge.   Hyperlipidemia -LDL of 25 01/2019 -Continue Crestor  For questions or updates, please contact [place] Please consult [email or web address] for contact info under        Signed, Arvil Chaco, PA-C  03/15/2019, 8:43 AM

## 2019-03-15 NOTE — Care Management Important Message (Signed)
Important Message  Patient Details  Name: [name] MRN: [id number] Date of Birth: 1956-03-21   Medicare Important Message Given:  Yes    Dannette Barbara 03/15/2019, 11:43 AM

## 2019-03-15 NOTE — Discharge Summary (Signed)
[facility] NAME: [name]    MR#:  [id number]  DATE OF BIRTH:  10/31/1955  DATE OF ADMISSION:  03/09/2019 ADMITTING PHYSICIAN: Lance Coon, MD  DATE OF DISCHARGE: 03/15/2019  PRIMARY CARE PHYSICIAN: Rutherford Guys, MD    ADMISSION DIAGNOSIS:  Urinary tract infection without hematuria, site unspecified [N39.0] Sepsis, due to unspecified organism, unspecified whether acute organ dysfunction present ([place]) [A41.9]  DISCHARGE DIAGNOSIS:  Sepsis due to UTI--completed treatment Acute on chronic systolic CHF CKD-III  SECONDARY DIAGNOSIS:   Past Medical History:  Diagnosis Date  . Acute myocardial infarction, subendocardial infarction, initial episode of care ([place]) 07/21/2012  . Acute osteomyelitis involving ankle and foot ([place]) 02/06/2015  . Acute systolic heart failure ([place]) 07/21/2012   New onset 07/19/12; admission to [facility] ED. Elevated Troponins.  S/p 2D-echo with EF 20-25%.  S/p cardiac catheterization with stenting LAD.  Repeat 2D-echo 10/2011 with improved EF of 35%.   . Anemia   . Automatic implantable cardioverter-defibrillator in situ    a. MDT CRT-D 06/2014, SN: [id number]  .removed in feb 2020  . CAD (coronary artery disease)    a. cardiac cath 101/04/2012: PCI/DES to chronically occluded mLAD, consideration PCI to diag branch in 4 weeks.   . Cataract   . Chicken pox   . Chronic kidney disease   . Chronic systolic CHF (congestive heart failure) (HCC)    a. mixed ICM & NICM; b. EF 20-25% by echo 07/2012, mid-dist 2/3 of LV sev HK/AK, mild MR. echo 10/2012: EF 30-35%, sev HK ant-septal & inf walls, GR1DD, mild MR, PASP 33. c. echo 02/2013: EF 30%, GR1DD, mild MR. echo 04/2014: EF 30%, Septal-lat dyssynchrony, global HK, inf AK, GR1DD, mild MR. d. echo 10/2014: EF50-55%, WM nl, GR1DD, septal mild paradox. e. echo 02/2015: EF 50-55%, wm   . Depression   . Heart attack ([place])   . Heel ulcer ([place]) 04/27/2015  .  History of blood transfusion ~ 2011   "plasma; had neuropathy; couldn't walk"  . Hypertension   . LBBB (left bundle branch block)   . Neuromuscular disorder ([place])   . Neuropathy 2011  . Obesity, unspecified   . OSA on CPAP    Moderate with AHI 23/hr and now on CPAP at 16cm H2O.  Her DME is [facility]  . Pure hypercholesterolemia   . Sepsis ([place]) 09/2018  . Type II diabetes mellitus ([place])   . Unspecified vitamin D deficiency     HOSPITAL COURSE:   63 y/o female with past medical history significant for CAD, history of MSSA bacteremia secondary to osteomyelitis of the left toe resulting in infected AICD/pacemaker status post removal in March 2020, CKD, chronic systolic CHF, obstructive sleep apnea, diabetes brought from home secondary to worsening shortness of breath and pedal edema.  1. Acute encephalopathy- secondary to UTI and sleep apnea - resolved now, improving Continue CPAP at bedtime sats 99% on RA  2. ARF on CKD stage3- Baseline creatinine is at 1.8, creatinine elevated, however was on torsemide for CHF -Diuretics resumed by cardiology--torsemide 40 mg bid and KCL 10 meq qd (home dose) -Nephrology has been consulted.  Hold nephrotoxins -Creatinine at 1.81  and stable today.    3. Acute on Chronic systolic CHF -echocardiogram with EF of 40 to 45% -  Monitor I's and O's.  Still has some pedal edema although pt feels much better with edema -baseline Daily weights.   -pt appears at baseline  now placed back on torsemide and KCL  4.  Sepsis-secondary to acute cystitis.  - Patient has had history of MSSA bacteremia.  Blood cultures now negative.  - Urine cultures growing E. coli.   -cont Keflex -completed rx  5.  Diabetes mellitus-on Lantus and sliding scale insulin  6.  CAD-stable.  Troponin elevated, from demand ischemia. -  On aspirin and Plavix  7.  DVT prophylaxis-subcu heparin  Physical therapy consulted-recommended home health at discharge. -Discharge today  with HHPT and CHF nursing  D/w Cardiology PA jacquelyn  CONSULTS OBTAINED:  Treatment Team:  Minna Merritts, MD  DRUG ALLERGIES:  No Known Allergies  DISCHARGE MEDICATIONS:   Allergies as of 03/15/2019   No Known Allergies     Medication List    TAKE these medications   aspirin 81 MG EC tablet Take 1 tablet (81 mg total) by mouth daily.   Calcium 600-D 600-400 MG-UNIT Tabs Generic drug:  Calcium Carbonate-Vitamin D3 Take 1 tablet by mouth 2 (two) times a day.   clopidogrel 75 MG tablet Commonly known as:  PLAVIX Take 1 tablet (75 mg total) by mouth daily.   ferrous sulfate 325 (65 FE) MG tablet Take 325 mg by mouth 2 (two) times daily with a meal.   Fish Oil 1000 MG Caps Take 1 capsule by mouth 2 (two) times a day.   gabapentin 600 MG tablet Commonly known as:  NEURONTIN Take 1 tablet (600 mg total) by mouth 2 (two) times daily. Takes 1 in the morning and two at bedtime What changed:  additional instructions   glimepiride 4 MG tablet Commonly known as:  AMARYL Take 4 mg by mouth 2 (two) times daily.   glucose blood test strip Commonly known as:  ONE TOUCH ULTRA TEST USE AS DIRECTED TO CHECK BLOOD SUGAR 3 TIMES DAILY   insulin glargine 100 UNIT/ML injection Commonly known as:  LANTUS Inject 0.16 mLs (16 Units total) into the skin at bedtime. What changed:  how much to take   metoprolol tartrate 25 MG tablet Commonly known as:  LOPRESSOR Take 1 tablet (25 mg total) by mouth 2 (two) times daily.   multivitamin tablet Take 1 tablet by mouth daily.   potassium chloride 10 MEQ tablet Commonly known as:  K-DUR Take 1 tablet (10 meq) by mouth once daily   rosuvastatin 40 MG tablet Commonly known as:  CRESTOR TAKE 1 TABLET BY MOUTH ONCE DAILY   torsemide 20 MG tablet Commonly known as:  DEMADEX Take 2 tablets (40 mg) by mouth twice daily   traMADol 50 MG tablet Commonly known as:  ULTRAM Take 1 tablet (50 mg total) by mouth every 6 (six) hours as  needed.            Durable Medical Equipment  (From admission, onward)         Start     Ordered   03/12/19 1217  For home use only DME Walker rolling  Once    Comments:  front wheeled  Question:  Patient needs a walker to treat with the following condition  Answer:  Ambulatory dysfunction   03/12/19 1217          If you experience worsening of your admission symptoms, develop shortness of breath, life threatening emergency, suicidal or homicidal thoughts you must seek medical attention immediately by calling 911 or calling your MD immediately  if symptoms less severe.  You Must read complete instructions/literature along with all the possible adverse reactions/side  effects for all the Medicines you take and that have been prescribed to you. Take any new Medicines after you have completely understood and accept all the possible adverse reactions/side effects.   Please note  You were cared for by a hospitalist during your hospital stay. If you have any questions about your discharge medications or the care you received while you were in the hospital after you are discharged, you can call the unit and asked to speak with the hospitalist on call if the hospitalist that took care of you is not available. Once you are discharged, your primary care physician will handle any further medical issues. Please note that NO REFILLS for any discharge medications will be authorized once you are discharged, as it is imperative that you return to your primary care physician (or establish a relationship with a primary care physician if you do not have one) for your aftercare needs so that they can reassess your need for medications and monitor your lab values. Today   SUBJECTIVE   Feels better  VITAL SIGNS:  Blood pressure 103/78, pulse 77, temperature 98.2 F (36.8 C), temperature source Oral, resp. rate 18, height 5\' 8"  (1.727 m), weight 133.7 kg, SpO2 99 %.  I/O:    Intake/Output Summary  (Last 24 hours) at 03/15/2019 0851 Last data filed at 03/15/2019 0502 Gross per 24 hour  Intake 480 ml  Output 1100 ml  Net -620 ml    PHYSICAL EXAMINATION:  GENERAL:  63 y.o.-year-old patient lying in the bed with no acute distress.obese EYES: Pupils equal, round, reactive to light and accommodation. No scleral icterus. Extraocular muscles intact.  HEENT: Head atraumatic, normocephalic. Oropharynx and nasopharynx clear.  NECK:  Supple, no jugular venous distention. No thyroid enlargement, no tenderness.  LUNGS: Normal breath sounds bilaterally, no wheezing, rales,rhonchi or crepitation. No use of accessory muscles of respiration.  CARDIOVASCULAR: S1, S2 normal. No murmurs, rubs, or gallops.  ABDOMEN: Soft, non-tender, non-distended. Bowel sounds present. No organomegaly or mass.  EXTREMITIES: chronic++ pedal edema,no cyanosis, or clubbing.  NEUROLOGIC: Cranial nerves II through XII are intact. Muscle strength 5/5 in all extremities. Sensation intact. Gait not checked.  PSYCHIATRIC: The patient is alert and oriented x 3.  SKIN: No obvious rash, lesion, or ulcer.   DATA REVIEW:   CBC  Recent Labs  Lab 03/10/19 0527  WBC 10.6*  HGB 11.4*  HCT 35.6*  PLT 175    Chemistries  Recent Labs  Lab 03/09/19 2054  03/15/19 0553  NA 133*   < > 130*  K 3.8   < > 4.2  CL 91*   < > 90*  CO2 25   < > 26  GLUCOSE 256*   < > 142*  BUN 78*   < > 97*  CREATININE 2.08*   < > 1.81*  CALCIUM 9.3   < > 9.1  AST 24  --   --   ALT 12  --   --   ALKPHOS 63  --   --   BILITOT 1.5*  --   --    < > = values in this interval not displayed.    Microbiology Results   Recent Results (from the past 240 hour(s))  Blood Culture (routine x 2)     Status: None   Collection Time: 03/09/19  8:54 PM  Result Value Ref Range Status   Specimen Description BLOOD LEFT ASSIST CONTROL  Final   Special Requests   Final    BOTTLES  DRAWN AEROBIC AND ANAEROBIC Blood Culture adequate volume   Culture   Final     NO GROWTH 5 DAYS Performed at [facility], [address] [place], [place] [address]    Report Status 03/14/2019 FINAL  Final  Urine culture     Status: Abnormal   Collection Time: 03/09/19  8:54 PM  Result Value Ref Range Status   Specimen Description   Final    URINE, RANDOM Performed at [facility], [address] [place], [place] [address]    Special Requests   Final    NONE Performed at [facility], [address] [place], [place] [address]    Culture >=100,000 COLONIES/mL ESCHERICHIA COLI (A)  Final   Report Status 03/12/2019 FINAL  Final   Organism ID, Bacteria ESCHERICHIA COLI (A)  Final      Susceptibility   Escherichia coli - MIC*    AMPICILLIN >=32 RESISTANT Resistant     CEFAZOLIN 8 SENSITIVE Sensitive     CEFTRIAXONE <=1 SENSITIVE Sensitive     CIPROFLOXACIN <=0.25 SENSITIVE Sensitive     GENTAMICIN <=1 SENSITIVE Sensitive     IMIPENEM <=0.25 SENSITIVE Sensitive     NITROFURANTOIN <=16 SENSITIVE Sensitive     TRIMETH/SULFA >=320 RESISTANT Resistant     AMPICILLIN/SULBACTAM >=32 RESISTANT Resistant     PIP/TAZO 64 INTERMEDIATE Intermediate     Extended ESBL NEGATIVE Sensitive     * >=100,000 COLONIES/mL ESCHERICHIA COLI  Blood Culture (routine x 2)     Status: None   Collection Time: 03/09/19  9:13 PM  Result Value Ref Range Status   Specimen Description BLOOD RIGHT ANTECUBITAL  Final   Special Requests   Final    BOTTLES DRAWN AEROBIC AND ANAEROBIC Blood Culture results may not be optimal due to an excessive volume of blood received in culture bottles   Culture   Final    NO GROWTH 5 DAYS Performed at [facility], [address] [place], [place] [address]    Report Status 03/14/2019 FINAL  Final  SARS Coronavirus 2 (CEPHEID - Performed in [place] hospital lab), Hosp Order     Status: None   Collection Time: 03/09/19  9:13 PM  Result Value Ref Range Status   SARS Coronavirus 2 NEGATIVE NEGATIVE Final     Comment: (NOTE) If result is NEGATIVE SARS-CoV-2 target nucleic acids are NOT DETECTED. The SARS-CoV-2 RNA is generally detectable in upper and lower  respiratory specimens during the acute phase of infection. The lowest  concentration of SARS-CoV-2 viral copies this assay can detect is 250  copies / mL. A negative result does not preclude SARS-CoV-2 infection  and should not be used as the sole basis for treatment or other  patient management decisions.  A negative result may occur with  improper specimen collection / handling, submission of specimen other  than nasopharyngeal swab, presence of viral mutation(s) within the  areas targeted by this assay, and inadequate number of viral copies  (<250 copies / mL). A negative result must be combined with clinical  observations, patient history, and epidemiological information. If result is POSITIVE SARS-CoV-2 target nucleic acids are DETECTED. The SARS-CoV-2 RNA is generally detectable in upper and lower  respiratory specimens dur ing the acute phase of infection.  Positive  results are indicative of active infection with SARS-CoV-2.  Clinical  correlation with patient history and other diagnostic information is  necessary to determine patient infection status.  Positive results do  not  rule out bacterial infection or co-infection with other viruses. If result is PRESUMPTIVE POSTIVE SARS-CoV-2 nucleic acids MAY BE PRESENT.   A presumptive positive result was obtained on the submitted specimen  and confirmed on repeat testing.  While 2019 novel coronavirus  (SARS-CoV-2) nucleic acids may be present in the submitted sample  additional confirmatory testing may be necessary for epidemiological  and / or clinical management purposes  to differentiate between  SARS-CoV-2 and other Sarbecovirus currently known to infect humans.  If clinically indicated additional testing with an alternate test  methodology [phone]) is advised. The SARS-CoV-2  RNA is generally  detectable in upper and lower respiratory sp ecimens during the acute  phase of infection. The expected result is Negative. Fact Sheet for Patients:  StrictlyIdeas.no Fact Sheet for Healthcare Providers: BankingDealers.co.za This test is not yet approved or cleared by the [place] FDA and has been authorized for detection and/or diagnosis of SARS-CoV-2 by FDA under an Emergency Use Authorization (EUA).  This EUA will remain in effect (meaning this test can be used) for the duration of the COVID-19 declaration under Section 564(b)(1) of the Act, 21 U.S.C. section 360bbb-3(b)(1), unless the authorization is terminated or revoked sooner. Performed at [facility], [address], [place], [place] [address]     RADIOLOGY:  No results found.   CODE STATUS:     Code Status Orders  (From admission, onward)         Start     Ordered   03/10/19 0443  Do not attempt resuscitation (DNR)  Continuous    Question Answer Comment  In the event of cardiac or respiratory ARREST Do not call a "code blue"   In the event of cardiac or respiratory ARREST Do not perform Intubation, CPR, defibrillation or ACLS   In the event of cardiac or respiratory ARREST Use medication by any route, position, wound care, and other measures to relive pain and suffering. May use oxygen, suction and manual treatment of airway obstruction as needed for comfort.      03/10/19 0443        Code Status History    Date Active Date Inactive Code Status Order ID Comments User Context   02/16/2019 1543 02/23/2019 1702 DNR [id number]  Fritzi Mandes, MD Inpatient   12/14/2018 1812 12/16/2018 1821 Full Code [id number]  Saundra Shelling, MD Inpatient   11/22/2018 1543 11/29/2018 0519 Full Code [id number]  Salary, Avel Peace, MD Inpatient   11/22/2018 1543 11/22/2018 1543 Full Code [id number]  Salary, Avel Peace, MD Inpatient   10/31/2018 0232 11/04/2018 1826 Full  Code [id number]  Lance Coon, MD Inpatient   10/13/2018 0047 10/16/2018 1900 DNR [id number]  Gorden Harms, MD Inpatient   09/28/2018 1456 10/09/2018 1726 Full Code [id number]  Nicholes Mango, MD ED   11/11/2015 1411 11/12/2015 1635 Full Code [id number]  Vaughan Basta, MD Inpatient   06/17/2015 0148 06/19/2015 1659 Full Code [id number]  Harrie Foreman, MD Inpatient   02/17/2015 1330 02/24/2015 1925 DNR [id number]  Bettey Costa, MD ED   07/06/2014 1100 07/07/2014 1524 Full Code [id number]  Deboraha Sprang, MD Inpatient   07/22/2012 1427 07/26/2012 1507 DNR [id number]  Nolon Rod, DO Inpatient    Advance Directive Documentation     Most Recent Value  Type of Advance Directive  Healthcare Power of Attorney, Living will  Pre-existing out of facility DNR order (yellow form or pink MOST form)  -  "MOST" Form in Place?  -  TOTAL TIME TAKING CARE OF THIS PATIENT: *40* minutes.    Fritzi Mandes M.D on 03/15/2019 at 8:51 AM  Between 7am to 6pm - Pager - [phone] After 6pm go to [email or web address] - password [email or web address] [facility]  Office  [phone]  CC: Primary care physician; Rutherford Guys, MD

## 2019-03-15 NOTE — Progress Notes (Signed)
Patient discharged to home with family.  Tele and IV d/c'd prior to discharge. Patient verbalizes understanding of discharge instructions.  Belongings sent home with patient.

## 2019-03-15 NOTE — Telephone Encounter (Signed)
-----   Message from Arvil Chaco, PA-C sent at 03/15/2019 11:28 AM EDT ----- Regarding: TCM Hello,  This patient was admitted and seen at [facility], and we are expecting discharge today 03/15/2019.  Can you please call and arrange / schedule for follow-up   (1) TCM appointment with Dr. Rockey Situ within the next 1-2 weeks?  (2) Follow-up BMET within the next 1-2 weeks?   Thank you!  Signed, Arvil Chaco, PA-C 03/15/2019, 11:28 AM Pager [phone]

## 2019-03-16 ENCOUNTER — Other Ambulatory Visit: Payer: Self-pay | Admitting: *Deleted

## 2019-03-16 ENCOUNTER — Telehealth: Payer: Self-pay | Admitting: Family Medicine

## 2019-03-16 NOTE — Telephone Encounter (Signed)
Copied from [place] [phone]. Topic: Quick Communication - Home Health Verbal Orders >> Mar 16, 2019  4:44 PM Berneta Levins wrote: Caller/Agency: Caryl Pina from [place] Number: [phone], OK to leave a message Requesting OT/PT/Skilled Nursing/Social Work/Speech Therapy: resume skilled nursing since getting out of hospital Frequency: 1 week 1, 2 week 1, 1 week 3, 2 PRN

## 2019-03-16 NOTE — Patient Outreach (Signed)
[facility]) Care Management  03/16/2019  [name] 28-Feb-1956 [id number]   Covering for assigned RNCM L. Tousey, referral received inpatient case manager as member has had multiple admissions over the last 6 months.  Most recent admission on 6/2 with diagnosis of sepsis related to UTI.  Notified member was discharged home with home health on 6/8.  Per chart, she also has history of but not limited to CHF, HTN, A-fib, NSTEMI, CAD, OSA, DM, Chronic renal insufficiency and lymphedema.    Call place to member for transition of care assessment, no answer.  HIPAA compliant voice message left.  Unsuccessful outreach letter sent, will follow up within the next 3-4 business days.  Valente David, [place], MSN [place] [phone]

## 2019-03-17 ENCOUNTER — Telehealth: Payer: Self-pay | Admitting: Cardiovascular Disease

## 2019-03-17 ENCOUNTER — Ambulatory Visit (INDEPENDENT_AMBULATORY_CARE_PROVIDER_SITE_OTHER): Payer: PPO

## 2019-03-17 ENCOUNTER — Other Ambulatory Visit: Payer: Self-pay

## 2019-03-17 ENCOUNTER — Telehealth (HOSPITAL_COMMUNITY): Payer: Self-pay

## 2019-03-17 DIAGNOSIS — I255 Ischemic cardiomyopathy: Secondary | ICD-10-CM

## 2019-03-17 NOTE — Telephone Encounter (Signed)
Since she was discharged on 6/8, an appointment on 6/23 is technically outside of the 2 week window. Are we able to bump that appointment up any sooner? If not, can we arrange for a TCM with [place] any sooner?

## 2019-03-17 NOTE — Telephone Encounter (Signed)
  Patient contacted regarding discharge from [facility] on 03/15/19.  Patient understands to follow up with provider Dr Caryl Comes on 03/30/19 at 1130 at [facility]. Patient understands discharge instructions? yes Patient understands medications and regiment? yes Patient understands to bring all medications to this visit? Yes  Patient verbalized understanding to go to the [facility] for lab work (BMET) on 03/24/19.

## 2019-03-17 NOTE — Telephone Encounter (Signed)
Verbal orders was given   

## 2019-03-17 NOTE — Telephone Encounter (Signed)
Attempted to contact goes straight to voicemail and unable to leave voice message due to mail box is full.  Will continue to contact for appt.   [place] [phone]

## 2019-03-17 NOTE — Telephone Encounter (Signed)
Patient declined app if able to see Rockey Situ in timeframe   Scheduled 6/16 virtual   Patient aware and [facility] 6/14-6/16

## 2019-03-17 NOTE — Progress Notes (Unsigned)
   Patient ID: [name], female    DOB: 02-23-1956, 63 y.o.   MRN: [id number]  Given the patient's poor LVEF c/t previous echocardiogram, Dr Rockey Situ was consulted about preliminary result . Doctor spent approximately 15-20 min with the patient explaining his concerns and described a treatment plan for the immediate future. The patient was then released from the office, with Dr Donivan Scull permission     Review of Systems    Physical Exam

## 2019-03-17 NOTE — Telephone Encounter (Signed)
Recent hospital admission for urosepsis, encephalopathy, ATN/acute renal failure Noted to have episodes of tachycardia in the hospital heart rate up to 120-130 beats per minute with associated chest discomfort felt to be secondary to stable angina Started on metoprolol  Patient presented to the office today for echocardiogram following recent hospital admission  She reports shortness of breath, limited in her ability to ambulate long distances  Echocardiogram preliminary report showing ejection fraction less than 20%, appears to be global hypokinesis unable to exclude regional wall motion abnormalities  She reports that in the past she has been on torsemide 80 mg twice daily She was discharged from the hospital on 40 twice daily She continues to have a fullness in her abdomen consistent with fluid retention  Lab work reviewed from 2 days ago showing mild improvement in her renal function creatinine down to 1.8  She denies any chest pain  We recommend she increase torsemide up to 80 twice daily If no significant urine output in the next day or so recommended that she call our office We will help arrange IV Lasix We will discuss with our staff in the office to see whether IV Lasix in the home can be arranged  Signed, Esmond Plants, MD, Ph.D [facility]

## 2019-03-17 NOTE — Telephone Encounter (Signed)
I don't currently have any openings any sooner with Dr. Caryl Comes as we are transitioning/ reworking schedules for 6/16 & 6/18 due to COVID-19.   Will forward to scheduling to see if TCM can be moved up to within her 2 week window with Dr. Rockey Situ.

## 2019-03-18 ENCOUNTER — Telehealth: Payer: Self-pay | Admitting: Cardiovascular Disease

## 2019-03-18 NOTE — Telephone Encounter (Signed)
Rip Harbour, home health nurse calling to update States patient BP is low 100/80 HR 55 Weight is 297 with +3 pitting edema Patient is pale and cool to the touch Would like to know if there should be changes made to medications Please call Rip Harbour to discuss

## 2019-03-19 ENCOUNTER — Telehealth: Payer: Self-pay | Admitting: Cardiovascular Disease

## 2019-03-19 ENCOUNTER — Other Ambulatory Visit: Payer: Self-pay | Admitting: *Deleted

## 2019-03-19 MED ORDER — METOLAZONE 5 MG PO TABS: 5.0000 mg | ORAL_TABLET | Freq: Every day | ORAL | 0 refills | Status: DC

## 2019-03-19 NOTE — Telephone Encounter (Signed)
See telephone encounter from 03/17/2019

## 2019-03-19 NOTE — Telephone Encounter (Signed)
No answer/Voicemail box full on patients line. Left voicemail message to call back on daughters line.

## 2019-03-19 NOTE — Telephone Encounter (Signed)
Spoke with nurse regarding patient and that I had tried calling her but no answer. She states that she will not answer if she does not recognize the number. She requested that I call her back in a little bit and she would try to reach her. Called her back and she was not able to reach her either. I will try to reach out again but I did update her on patients upcoming appointments and information of current plans.

## 2019-03-19 NOTE — Telephone Encounter (Signed)
Spoke with patient and she states that she has been taking the 80 mg torsemide twice daily and still not having tons of fluid out. [place] campground and are not at home. Reviewed that Dr. Rockey Situ wanted me to call and see if we could have her try metolazone 5 mg 1 tablet by mouth 30 minutes before her torsemide dosing on Saturday & Sunday. She states that they are out of town and not sure how to get it. Advised that I would send it to local pharmacy near the [place] where she is at. Was able to look that up and sent it in, provided her with address, telephone number, and instructions for taking the medication. She verbalized understanding with no further questions at this time. Instructed her to please keep [place] updated and call on Monday to let us know if that helped. She verbalized understanding with no further questions at this time.

## 2019-03-19 NOTE — Patient Outreach (Signed)
[facility]) Care Management  03/19/2019  Lakaisha Danish Perdomo June 06, 1956 [id number]   Call placed to member in attempt to complete transition of care assessment, unsuccessful.  Second consecutive unsuccessful attempt, unable to leave voice message as mail box is full.  Noted that cardiology office has also been unsuccessful with outreach.  Will update assigned care manager to make third attempt within the next 3-4 business days.  Valente David, [place], MSN [place] [phone]

## 2019-03-19 NOTE — Telephone Encounter (Signed)
See other telephone encounter.

## 2019-03-19 NOTE — Telephone Encounter (Signed)
No answer/Voicemail box is full.  

## 2019-03-19 NOTE — Telephone Encounter (Signed)
Late entry, Dr. Rockey Situ saw patient while here for echo with instructions to increase her medications. Attempted to reach her home health agency and was waiting for return call.

## 2019-03-19 NOTE — Addendum Note (Signed)
Addended by: Valora Corporal on: 03/19/2019 05:08 PM   Modules accepted: Orders

## 2019-03-20 DIAGNOSIS — R000 Tachycardia, unspecified: Secondary | ICD-10-CM | POA: Diagnosis not present

## 2019-03-22 ENCOUNTER — Telehealth (HOSPITAL_COMMUNITY): Payer: Self-pay

## 2019-03-22 ENCOUNTER — Telehealth: Payer: Self-pay | Admitting: Cardiovascular Disease

## 2019-03-22 ENCOUNTER — Other Ambulatory Visit: Payer: Self-pay | Admitting: *Deleted

## 2019-03-22 ENCOUNTER — Encounter: Payer: Self-pay | Admitting: Family Medicine

## 2019-03-22 ENCOUNTER — Ambulatory Visit (INDEPENDENT_AMBULATORY_CARE_PROVIDER_SITE_OTHER): Payer: PPO | Admitting: Family Medicine

## 2019-03-22 ENCOUNTER — Other Ambulatory Visit: Payer: Self-pay

## 2019-03-22 VITALS — BP 112/84 | HR 60 | Temp 97.3°F | Ht 68.0 in | Wt 297.0 lb

## 2019-03-22 DIAGNOSIS — E1122 Type 2 diabetes mellitus with diabetic chronic kidney disease: Secondary | ICD-10-CM | POA: Diagnosis not present

## 2019-03-22 DIAGNOSIS — I5023 Acute on chronic systolic (congestive) heart failure: Secondary | ICD-10-CM | POA: Diagnosis not present

## 2019-03-22 DIAGNOSIS — Z8619 Personal history of other infectious and parasitic diseases: Secondary | ICD-10-CM | POA: Diagnosis not present

## 2019-03-22 DIAGNOSIS — I471 Supraventricular tachycardia: Secondary | ICD-10-CM

## 2019-03-22 DIAGNOSIS — N183 Chronic kidney disease, stage 3 (moderate): Secondary | ICD-10-CM

## 2019-03-22 DIAGNOSIS — Z794 Long term (current) use of insulin: Secondary | ICD-10-CM | POA: Diagnosis not present

## 2019-03-22 DIAGNOSIS — R946 Abnormal results of thyroid function studies: Secondary | ICD-10-CM | POA: Diagnosis not present

## 2019-03-22 DIAGNOSIS — K5900 Constipation, unspecified: Secondary | ICD-10-CM

## 2019-03-22 MED ORDER — DOCUSATE SODIUM 100 MG PO CAPS: 100.0000 mg | ORAL_CAPSULE | Freq: Two times a day (BID) | ORAL | 3 refills | Status: DC

## 2019-03-22 NOTE — Patient Outreach (Signed)
[facility]) Care Management [place] CM- Case Closure 03/22/2019  [name] 1956/05/19 [id number]  [facility] CM- Case Closure note re:  [name]. [place], 63 y/o female referred to [place] on March 12, 2019 by inpatient RN CM after recent hospital admission June 2- 8, 2020 for sepsis secondary to UTI.  Received notification from [facility] leadership that patient is actively followed by external care management program through her insurance provider  Plan:  Will make patient inactive with [facility] CM as she is currently active with an external program and make patient's PCP aware of same  Oneta Rack, RN, BSN, [place] Coordinator [facility] Care Management  [phone]

## 2019-03-22 NOTE — Telephone Encounter (Signed)
Patient states that weight is down 4 pounds but she did not have tons of urine like she had hoped. She reports that she did speak with nurse earlier and that they canceled her appointment with Dr. Rockey Situ and scheduled her to see Dr. Caryl Comes. Also stated that EMT would be coming out to see her at 2 pm. Reviewed that I would update Dr. Rockey Situ and he would speak with Dr. Caryl Comes as well. Notified Presenter, broadcasting that patient will most likely need some labs because we gave her metolazone over the weekend due to low EF and low urine output. I also have numbers for Steffanie Dunn and [facility] home health nurse. Updated Steffanie Dunn that patient is aware of her appointment. Patient verbalized understanding of our conversation with no questions.

## 2019-03-22 NOTE — Progress Notes (Signed)
6/15/202011:14 AM  [name] June 20, 1956, 63 y.o., female [id number]  Chief Complaint  Patient presents with  . Follow-up    edema and sepsis, feeling "not good right now"    HPI:   Patient is a 63 y.o. 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,MSSA bacteremia2/2 osteomyelitis s/p amputation,systolicCHF, OSA on cpap, DM2 on insulin, CKD, HTN, HLP, anemia here forhosp followup   Last telemedicine visit 02/24/2019 hosp 6/2 - 03/15/2019 Dx: Sepsis 2/2 UTI - e coli, keflex Acute on chronic CHF  CKD3 Acute encephaloptahy 2/2 sepsis and apnea On cpap Torsemide decreased to 40mg  BID and KCL 10mg  daily  Patient reports that she continues to struggle with her fluid Was given metolazone for Saturday and Sunday, has lost 4 lbs Reports cough, SOB, orthopnea, has been sleeping in her recliner Has not been using cpap, normally does not use cpap when using recliner denies CP,  Sees cardiology tomorrow  Having constipation, takes iron  Reports cbgs are doing fair This morning 164, 145 last night  Normally fasings are lower Denies any lows  Checks weight, BP and temp at home  Denies any nausea, vomiting, abd pain, dysuria, fever or chills  Wt Readings from Last 3 Encounters:  03/22/19 297 lb (134.7 kg)  03/15/19 294 lb 12.8 oz (133.7 kg)  03/04/19 284 lb (128.8 kg)    Lab Results  Component Value Date   CREATININE 1.81 (H) 03/15/2019   BUN 97 (H) 03/15/2019   NA 130 (L) 03/15/2019   K 4.2 03/15/2019   CL 90 (L) 03/15/2019   CO2 26 03/15/2019   Lab Results  Component Value Date   WBC 10.6 (H) 03/10/2019   HGB 11.4 (L) 03/10/2019   HCT 35.6 (L) 03/10/2019   MCV 85.6 03/10/2019   PLT 175 03/10/2019   Lab Results  Component Value Date   CHOL 82 (L) 02/03/2019   HDL 37 (L) 02/03/2019   LDLCALC 25 02/03/2019   TRIG 101 02/03/2019   CHOLHDL 2.2 02/03/2019   Lab Results  Component Value Date   TSH 4.684 (H) 11/22/2018   Lab Results  Component Value Date   HGBA1C 5.6 02/03/2019     Fall Risk  03/22/2019 02/03/2019 12/24/2018 12/23/2018 11/11/2018  Falls in the past year? 0 0 0 0 0  Number falls in past yr: 0 - 0 0 0  Injury with Fall? 0 - 0 0 0  Comment - - - - -  Risk for fall due to : - - - - -  Risk for fall due to: Comment - - - - -  Follow up - - - - -     Depression screen [facility] 2/9 03/22/2019 02/03/2019 12/24/2018  Decreased Interest 0 0 0  Down, Depressed, Hopeless 0 0 0  PHQ - 2 Score 0 0 0  Altered sleeping - - -  Tired, decreased energy - - -  Change in appetite - - -  Feeling bad or failure about yourself  - - -  Trouble concentrating - - -  Moving slowly or fidgety/restless - - -  Suicidal thoughts - - -  PHQ-9 Score - - -  Difficult doing work/chores - - -  Some recent data might be hidden    No Known Allergies  Prior to Admission medications   Medication Sig Start Date End Date Taking? Authorizing Provider  aspirin EC 81 MG EC tablet Take 1 tablet (81 mg total)  by mouth daily. 11/24/18  Yes Epifanio Lesches, MD  Calcium Carbonate-Vitamin D3 (CALCIUM 600-D) 600-400 MG-UNIT TABS Take 1 tablet by mouth 2 (two) times a day.   Yes [provider]  clopidogrel (PLAVIX) 75 MG tablet Take 1 tablet (75 mg total) by mouth daily. 12/24/18  Yes Rutherford Guys, MD  ferrous sulfate 325 (65 FE) MG tablet Take 325 mg by mouth 2 (two) times daily with a meal.   Yes [provider]  gabapentin (NEURONTIN) 600 MG tablet Take 1 tablet (600 mg total) by mouth 2 (two) times daily. Takes 1 in the morning and two at bedtime Patient taking differently: Take 600 mg by mouth 2 (two) times daily. Take 1 tablet (600mg ) by mouth every morning and take 2 tablets (1200mg ) by mouth at bedtime 12/24/18  Yes Rutherford Guys, MD  glimepiride (AMARYL) 4 MG tablet Take 4 mg by mouth 2 (two) times daily.   Yes [provider]  glucose blood (ONE TOUCH ULTRA TEST)  test strip USE AS DIRECTED TO CHECK BLOOD SUGAR 3 TIMES DAILY 01/09/19  Yes Rutherford Guys, MD  insulin glargine (LANTUS) 100 UNIT/ML injection Inject 0.16 mLs (16 Units total) into the skin at bedtime. Patient taking differently: Inject 14 Units into the skin at bedtime.  11/24/18  Yes Epifanio Lesches, MD  metoprolol tartrate (LOPRESSOR) 25 MG tablet Take 1 tablet (25 mg total) by mouth 2 (two) times daily. 03/15/19  Yes Fritzi Mandes, MD  Multiple Vitamin (MULTIVITAMIN) tablet Take 1 tablet by mouth daily.   Yes [provider]  Omega-3 Fatty Acids (FISH OIL) 1000 MG CAPS Take 1 capsule by mouth 2 (two) times a day.    Yes [provider]  potassium chloride (K-DUR) 10 MEQ tablet Take 1 tablet (10 meq) by mouth once daily   Yes [provider]  rosuvastatin (CRESTOR) 40 MG tablet TAKE 1 TABLET BY MOUTH ONCE DAILY Patient taking differently: Take 40 mg by mouth daily.  02/08/19  Yes Wellington Hampshire, MD  torsemide (DEMADEX) 20 MG tablet Take 2 tablets (40 mg) by mouth twice daily 03/09/19  Yes Dunn, Areta Haber, PA-C  traMADol (ULTRAM) 50 MG tablet Take 1 tablet (50 mg total) by mouth every 6 (six) hours as needed. 01/07/19  Yes Rutherford Guys, MD  metolazone (ZAROXOLYN) 5 MG tablet Take 1 tablet (5 mg total) by mouth daily for 2 days. Take 30 minutes before Torsemide. 03/19/19 03/21/19  Minna Merritts, MD    Past Medical History:  Diagnosis Date  . Acute myocardial infarction, subendocardial infarction, initial episode of care ([place]) 07/21/2012  . Acute osteomyelitis involving ankle and foot ([place]) 02/06/2015  . Acute systolic heart failure ([place]) 07/21/2012   New onset 07/19/12; admission to [facility] ED. Elevated Troponins.  S/p 2D-echo with EF 20-25%.  S/p cardiac catheterization with stenting LAD.  Repeat 2D-echo 10/2011 with improved EF of 35%.   . Anemia   . Automatic implantable cardioverter-defibrillator in situ    a. MDT CRT-D 06/2014, SN: [id number]  .removed in feb  2020  . CAD (coronary artery disease)    a. cardiac cath 101/04/2012: PCI/DES to chronically occluded mLAD, consideration PCI to diag branch in 4 weeks.   . Cataract   . Chicken pox   . Chronic kidney disease   . Chronic systolic CHF (congestive heart failure) (HCC)    a. mixed ICM & NICM; b. EF 20-25% by echo 07/2012, mid-dist 2/3 of LV sev HK/AK,  mild MR. echo 10/2012: EF 30-35%, sev HK ant-septal & inf walls, GR1DD, mild MR, PASP 33. c. echo 02/2013: EF 30%, GR1DD, mild MR. echo 04/2014: EF 30%, Septal-lat dyssynchrony, global HK, inf AK, GR1DD, mild MR. d. echo 10/2014: EF50-55%, WM nl, GR1DD, septal mild paradox. e. echo 02/2015: EF 50-55%, wm   . Depression   . Heart attack ([place])   . Heel ulcer ([place]) 04/27/2015  . History of blood transfusion ~ 2011   "plasma; had neuropathy; couldn't walk"  . Hypertension   . LBBB (left bundle branch block)   . Neuromuscular disorder ([place])   . Neuropathy 2011  . Obesity, unspecified   . OSA on CPAP    Moderate with AHI 23/hr and now on CPAP at 16cm H2O.  Her DME is [facility]  . Pure hypercholesterolemia   . Sepsis ([place]) 09/2018  . Type II diabetes mellitus ([place])   . Unspecified vitamin D deficiency     Past Surgical History:  Procedure Laterality Date  . AMPUTATION TOE Right 06/18/2015   Procedure: AMPUTATION TOE;  Surgeon: Samara Deist, DPM;  Location: [facility] ORS;  Service: Podiatry;  Laterality: Right;  . AMPUTATION TOE Left 10/08/2018   Procedure: AMPUTATION TOE LEFT 2ND;  Surgeon: Albertine Patricia, DPM;  Location: [facility] ORS;  Service: Podiatry;  Laterality: Left;  . BACK SURGERY    . BI-VENTRICULAR IMPLANTABLE CARDIOVERTER DEFIBRILLATOR N/A 07/06/2014   Procedure: BI-VENTRICULAR IMPLANTABLE CARDIOVERTER DEFIBRILLATOR  (CRT-D);  Surgeon: Deboraha Sprang, MD;  Location: [facility] CATH LAB;  Service: Cardiovascular;  Laterality: N/A;  . BI-VENTRICULAR IMPLANTABLE CARDIOVERTER DEFIBRILLATOR  (CRT-D)  07/06/2014  . BILATERAL OOPHORECTOMY  01/2011   ovarian cyst benign   . COLONOSCOPY WITH PROPOFOL Left 02/22/2015   Procedure: COLONOSCOPY WITH PROPOFOL;  Surgeon: Hulen Luster, MD;  Location: [facility] ENDOSCOPY;  Service: Endoscopy;  Laterality: Left;  . CORONARY ANGIOPLASTY WITH STENT PLACEMENT Left 07/2012   new onset systolic CHF; elevated troponins.  Cardiac catheterization with stenting to LAD; EF 15%.  2D-echo: EF 20-25%.  . ESOPHAGOGASTRODUODENOSCOPY N/A 02/22/2015   Procedure: ESOPHAGOGASTRODUODENOSCOPY (EGD);  Surgeon: Hulen Luster, MD;  Location: [facility] ENDOSCOPY;  Service: Endoscopy;  Laterality: N/A;  . INCISION AND DRAINAGE ABSCESS Right 2007   groin; with ICU stay due to sepsis.  Marland Kitchen LAPAROSCOPIC CHOLECYSTECTOMY  2011  . LEFT HEART CATH AND CORONARY ANGIOGRAPHY N/A 10/05/2018   Procedure: LEFT HEART CATH AND CORONARY ANGIOGRAPHY;  Surgeon: Minna Merritts, MD;  Location: [facility] CV LAB;  Service: Cardiovascular;  Laterality: N/A;  . LEFT HEART CATHETERIZATION WITH CORONARY ANGIOGRAM N/A 07/21/2012   Procedure: LEFT HEART CATHETERIZATION WITH CORONARY ANGIOGRAM;  Surgeon: Jolaine Artist, MD;  Location: [facility] CATH LAB;  Service: Cardiovascular;  Laterality: N/A;  . [place]   L4-5  . PACEMAKER REMOVAL  11/2018   due to infection around pacemaker  . PERCUTANEOUS CORONARY STENT INTERVENTION (PCI-S) N/A 07/23/2012   Procedure: PERCUTANEOUS CORONARY STENT INTERVENTION (PCI-S);  Surgeon: Sherren Mocha, MD;  Location: [facility] CATH LAB;  Service: Cardiovascular;  Laterality: N/A;  . PERIPHERAL VASCULAR BALLOON ANGIOPLASTY Left 10/06/2018   Procedure: PERIPHERAL VASCULAR BALLOON ANGIOPLASTY;  Surgeon: Katha Cabal, MD;  Location: [place] CV LAB;  Service: Cardiovascular;  Laterality: Left;  . PERIPHERAL VASCULAR CATHETERIZATION N/A 02/10/2015   Procedure: Picc Line Insertion;  Surgeon: Katha Cabal, MD;  Location: [place] CV LAB;  Service: Cardiovascular;  Laterality: N/A;  . TEE WITHOUT CARDIOVERSION N/A 10/02/2018    Procedure:  TRANSESOPHAGEAL ECHOCARDIOGRAM (TEE);  Surgeon: Wellington Hampshire, MD;  Location: [facility] ORS;  Service: Cardiovascular;  Laterality: N/A;  . [place]  . VAGINAL HYSTERECTOMY  01/2011   Fibroids/DUB.  Ovaries removed. [place].    Social History   Tobacco Use  . Smoking status: Never Smoker  . Smokeless tobacco: Never Used  Substance Use Topics  . Alcohol use: No    Alcohol/week: 0.0 standard drinks    Family History  Problem Relation Age of Onset  . Diabetes Mother   . Hypertension Mother   . Arthritis Mother        knees, lumbar DDD, cervical DDD  . Cancer Father        prostate,skin,lymphoma.  . Cancer Brother 45       bladder cancer; non-smoker  . Diabetes Maternal Grandmother   . Heart disease Maternal Grandmother   . COPD Maternal Grandmother   . Diabetes Paternal Grandmother   . Obesity Brother   . Diabetes Son   . Hypertension Son   . Cancer Maternal Grandfather   . Diabetes Paternal Grandfather     ROS Per hpi  OBJECTIVE:  Today's Vitals   03/22/19 1031  BP: 112/84  Pulse: 60  Temp: (!) 97.3 F (36.3 C)  TempSrc: Oral  SpO2: 97%  Weight: 297 lb (134.7 kg)  Height: 5\' 8"  (1.727 m)   Body mass index is 45.16 kg/m.   Physical Exam Vitals signs and nursing note reviewed.  Constitutional:      General: She is not in acute distress.    Appearance: She is well-developed.  HENT:     Head: Normocephalic and atraumatic.     Mouth/Throat:     Pharynx: No oropharyngeal exudate.  Eyes:     General: No scleral icterus.    Conjunctiva/sclera: Conjunctivae normal.     Pupils: Pupils are equal, round, and reactive to light.  Neck:     Musculoskeletal: Neck supple.  Cardiovascular:     Rate and Rhythm: Normal rate and regular rhythm.     Heart sounds: Normal heart sounds. No murmur. No friction rub. No gallop.   Pulmonary:     Effort: Pulmonary effort is normal.     Breath sounds: Normal breath sounds. No wheezing or rales.   Musculoskeletal:     Right lower leg: Edema present.     Left lower leg: Edema present.  Skin:    General: Skin is warm and dry.     Coloration: Skin is pale.  Neurological:     Mental Status: She is alert and oriented to person, place, and time.      ASSESSMENT and PLAN  1. Acute on chronic systolic CHF (congestive heart failure) ([place]) Managed by cards, sees them tomorrow. Not in acute distress.  - Comprehensive metabolic panel  2. Controlled type 2 diabetes mellitus with stage 3 chronic kidney disease, with long-term current use of insulin (HCC) - Comprehensive metabolic panel  3. Abnormal thyroid function test - TSH - T4, Free  4. History of sepsis - CBC  5. Constipation, unspecified constipation type - docusate sodium (COLACE) 100 MG capsule; Take 1 capsule (100 mg total) by mouth 2 (two) times daily.  Return in about 2 weeks (around 04/05/2019) for VIRTUAL.    Rutherford Guys, MD Primary Care at [place], [place] [address] Ph.  [phone] Fax [phone]

## 2019-03-22 NOTE — Patient Instructions (Signed)
° ° ° °  If you have lab work done today you will be contacted with your lab results within the next 2 weeks.  If you have not heard from us then please contact us. The fastest way to get your results is to register for My Chart. ° ° °IF you received an x-ray today, you will receive an invoice from [facility]. Please contact [facility] at [phone] with questions or concerns regarding your invoice.  ° °IF you received labwork today, you will receive an invoice from [facility]. Please contact [facility] at [phone] with questions or concerns regarding your invoice.  ° °Our billing staff will not be able to assist you with questions regarding bills from these companies. ° °You will be contacted with the lab results as soon as they are available. The fastest way to get your results is to activate your My Chart account. Instructions are located on the last page of this paperwork. If you have not heard from us regarding the results in 2 weeks, please contact this office. °  ° ° ° °

## 2019-03-22 NOTE — Telephone Encounter (Signed)
Attempted to reach out to [place].  Unsuccessful, mail box is full and no answer.  Will continue to try.  [facility] [phone]

## 2019-03-23 ENCOUNTER — Telehealth: Payer: PPO | Admitting: Cardiovascular Disease

## 2019-03-23 ENCOUNTER — Telehealth (INDEPENDENT_AMBULATORY_CARE_PROVIDER_SITE_OTHER): Payer: PPO | Admitting: Internal Medicine

## 2019-03-23 ENCOUNTER — Inpatient Hospital Stay (HOSPITAL_COMMUNITY)
Admission: AD | Admit: 2019-03-23 | Discharge: 2019-04-02 | DRG: 222 | Disposition: A | Discharge status: Home Health | Payer: PPO | Attending: Internal Medicine | Admitting: Internal Medicine

## 2019-03-23 ENCOUNTER — Telehealth: Payer: Self-pay | Admitting: Cardiovascular Disease

## 2019-03-23 ENCOUNTER — Encounter (HOSPITAL_COMMUNITY): Payer: Self-pay | Admitting: *Deleted

## 2019-03-23 ENCOUNTER — Other Ambulatory Visit: Payer: Self-pay

## 2019-03-23 ENCOUNTER — Telehealth: Payer: Self-pay | Admitting: Internal Medicine

## 2019-03-23 ENCOUNTER — Telehealth: Payer: Self-pay | Admitting: Family Medicine

## 2019-03-23 VITALS — BP 112/80 | Ht 68.0 in | Wt 293.0 lb

## 2019-03-23 DIAGNOSIS — Z955 Presence of coronary angioplasty implant and graft: Secondary | ICD-10-CM

## 2019-03-23 DIAGNOSIS — I5043 Acute on chronic combined systolic (congestive) and diastolic (congestive) heart failure: Secondary | ICD-10-CM | POA: Diagnosis not present

## 2019-03-23 DIAGNOSIS — Z48812 Encounter for surgical aftercare following surgery on the circulatory system: Secondary | ICD-10-CM | POA: Diagnosis not present

## 2019-03-23 DIAGNOSIS — Z20828 Contact with and (suspected) exposure to other viral communicable diseases: Secondary | ICD-10-CM | POA: Diagnosis not present

## 2019-03-23 DIAGNOSIS — I255 Ischemic cardiomyopathy: Secondary | ICD-10-CM | POA: Diagnosis present

## 2019-03-23 DIAGNOSIS — E1142 Type 2 diabetes mellitus with diabetic polyneuropathy: Secondary | ICD-10-CM | POA: Diagnosis present

## 2019-03-23 DIAGNOSIS — Z9981 Dependence on supplemental oxygen: Secondary | ICD-10-CM | POA: Diagnosis not present

## 2019-03-23 DIAGNOSIS — I428 Other cardiomyopathies: Secondary | ICD-10-CM | POA: Diagnosis present

## 2019-03-23 DIAGNOSIS — E1122 Type 2 diabetes mellitus with diabetic chronic kidney disease: Secondary | ICD-10-CM | POA: Diagnosis not present

## 2019-03-23 DIAGNOSIS — Z89421 Acquired absence of other right toe(s): Secondary | ICD-10-CM | POA: Diagnosis not present

## 2019-03-23 DIAGNOSIS — N183 Chronic kidney disease, stage 3 (moderate): Secondary | ICD-10-CM

## 2019-03-23 DIAGNOSIS — Z9862 Peripheral vascular angioplasty status: Secondary | ICD-10-CM

## 2019-03-23 DIAGNOSIS — I5082 Biventricular heart failure: Secondary | ICD-10-CM | POA: Diagnosis not present

## 2019-03-23 DIAGNOSIS — I472 Ventricular tachycardia: Secondary | ICD-10-CM | POA: Diagnosis not present

## 2019-03-23 DIAGNOSIS — E876 Hypokalemia: Secondary | ICD-10-CM | POA: Diagnosis present

## 2019-03-23 DIAGNOSIS — I2510 Atherosclerotic heart disease of native coronary artery without angina pectoris: Secondary | ICD-10-CM | POA: Diagnosis not present

## 2019-03-23 DIAGNOSIS — I5023 Acute on chronic systolic (congestive) heart failure: Secondary | ICD-10-CM | POA: Diagnosis not present

## 2019-03-23 DIAGNOSIS — Z8249 Family history of ischemic heart disease and other diseases of the circulatory system: Secondary | ICD-10-CM

## 2019-03-23 DIAGNOSIS — Z89422 Acquired absence of other left toe(s): Secondary | ICD-10-CM

## 2019-03-23 DIAGNOSIS — E119 Type 2 diabetes mellitus without complications: Secondary | ICD-10-CM | POA: Diagnosis not present

## 2019-03-23 DIAGNOSIS — I5022 Chronic systolic (congestive) heart failure: Secondary | ICD-10-CM | POA: Diagnosis not present

## 2019-03-23 DIAGNOSIS — I471 Supraventricular tachycardia: Secondary | ICD-10-CM | POA: Diagnosis not present

## 2019-03-23 DIAGNOSIS — Z7902 Long term (current) use of antithrombotics/antiplatelets: Secondary | ICD-10-CM

## 2019-03-23 DIAGNOSIS — I130 Hypertensive heart and chronic kidney disease with heart failure and stage 1 through stage 4 chronic kidney disease, or unspecified chronic kidney disease: Secondary | ICD-10-CM

## 2019-03-23 DIAGNOSIS — Z6841 Body Mass Index (BMI) 40.0 and over, adult: Secondary | ICD-10-CM

## 2019-03-23 DIAGNOSIS — I252 Old myocardial infarction: Secondary | ICD-10-CM | POA: Diagnosis not present

## 2019-03-23 DIAGNOSIS — Z833 Family history of diabetes mellitus: Secondary | ICD-10-CM

## 2019-03-23 DIAGNOSIS — I081 Rheumatic disorders of both mitral and tricuspid valves: Secondary | ICD-10-CM | POA: Diagnosis present

## 2019-03-23 DIAGNOSIS — J189 Pneumonia, unspecified organism: Secondary | ICD-10-CM | POA: Diagnosis not present

## 2019-03-23 DIAGNOSIS — N179 Acute kidney failure, unspecified: Secondary | ICD-10-CM

## 2019-03-23 DIAGNOSIS — J811 Chronic pulmonary edema: Secondary | ICD-10-CM | POA: Diagnosis not present

## 2019-03-23 DIAGNOSIS — Z79891 Long term (current) use of opiate analgesic: Secondary | ICD-10-CM

## 2019-03-23 DIAGNOSIS — R918 Other nonspecific abnormal finding of lung field: Secondary | ICD-10-CM | POA: Diagnosis not present

## 2019-03-23 DIAGNOSIS — I447 Left bundle-branch block, unspecified: Secondary | ICD-10-CM | POA: Diagnosis not present

## 2019-03-23 DIAGNOSIS — I509 Heart failure, unspecified: Secondary | ICD-10-CM | POA: Diagnosis not present

## 2019-03-23 DIAGNOSIS — G4733 Obstructive sleep apnea (adult) (pediatric): Secondary | ICD-10-CM | POA: Diagnosis present

## 2019-03-23 DIAGNOSIS — Z8349 Family history of other endocrine, nutritional and metabolic diseases: Secondary | ICD-10-CM

## 2019-03-23 DIAGNOSIS — Z950 Presence of cardiac pacemaker: Secondary | ICD-10-CM | POA: Diagnosis not present

## 2019-03-23 DIAGNOSIS — Z95818 Presence of other cardiac implants and grafts: Secondary | ICD-10-CM

## 2019-03-23 DIAGNOSIS — Z8619 Personal history of other infectious and parasitic diseases: Secondary | ICD-10-CM

## 2019-03-23 DIAGNOSIS — Z794 Long term (current) use of insulin: Secondary | ICD-10-CM

## 2019-03-23 DIAGNOSIS — E1151 Type 2 diabetes mellitus with diabetic peripheral angiopathy without gangrene: Secondary | ICD-10-CM | POA: Diagnosis present

## 2019-03-23 DIAGNOSIS — Z79899 Other long term (current) drug therapy: Secondary | ICD-10-CM

## 2019-03-23 DIAGNOSIS — Z87898 Personal history of other specified conditions: Secondary | ICD-10-CM

## 2019-03-23 DIAGNOSIS — Z7982 Long term (current) use of aspirin: Secondary | ICD-10-CM

## 2019-03-23 DIAGNOSIS — A492 Hemophilus influenzae infection, unspecified site: Secondary | ICD-10-CM | POA: Diagnosis not present

## 2019-03-23 LAB — CBC
HCT: 36.3 % (ref 36.0–46.0)
Hematocrit: 37.2 % (ref 34.0–46.6)
Hemoglobin: 12 g/dL (ref 11.1–15.9)
Hemoglobin: 11.6 g/dL — ABNORMAL LOW (ref 12.0–15.0)
MCH: 26.9 pg (ref 26.6–33.0)
MCH: 26.8 pg (ref 26.0–34.0)
MCHC: 32.3 g/dL (ref 31.5–35.7)
MCHC: 32 g/dL (ref 30.0–36.0)
MCV: 83 fL (ref 79–97)
MCV: 83.8 fL (ref 80.0–100.0)
Platelets: 223 10*3/uL (ref 150–450)
Platelets: 185 10*3/uL (ref 150–400)
RBC: 4.46 x10E6/uL (ref 3.77–5.28)
RBC: 4.33 MIL/uL (ref 3.87–5.11)
RDW: 14.4 % (ref 11.7–15.4)
RDW: 15.5 % (ref 11.5–15.5)
WBC: 9.4 10*3/uL (ref 3.4–10.8)
WBC: 5.6 10*3/uL (ref 4.0–10.5)
nRBC: 0 % (ref 0.0–0.2)

## 2019-03-23 LAB — COMPREHENSIVE METABOLIC PANEL
ALT: 25 IU/L (ref 0–32)
ALT: 22 U/L (ref 0–44)
AST: 25 IU/L (ref 0–40)
AST: 18 U/L (ref 15–41)
Albumin/Globulin Ratio: 1.5 (ref 1.2–2.2)
Albumin: 4 g/dL (ref 3.8–4.8)
Albumin: 3.5 g/dL (ref 3.5–5.0)
Alkaline Phosphatase: 73 IU/L (ref 39–117)
Alkaline Phosphatase: 57 U/L (ref 38–126)
Anion gap: 14 (ref 5–15)
BUN/Creatinine Ratio: 41 — ABNORMAL HIGH (ref 12–28)
BUN: 101 mg/dL (ref 8–27)
BUN: 111 mg/dL — ABNORMAL HIGH (ref 8–23)
Bilirubin Total: 0.5 mg/dL (ref 0.0–1.2)
CO2: 21 mmol/L (ref 20–29)
CO2: 29 mmol/L (ref 22–32)
Calcium: 9.5 mg/dL (ref 8.7–10.3)
Calcium: 9.7 mg/dL (ref 8.9–10.3)
Chloride: 84 mmol/L — ABNORMAL LOW (ref 96–106)
Chloride: 88 mmol/L — ABNORMAL LOW (ref 98–111)
Creatinine, Ser: 2.47 mg/dL — ABNORMAL HIGH (ref 0.57–1.00)
Creatinine, Ser: 2.4 mg/dL — ABNORMAL HIGH (ref 0.44–1.00)
GFR calc Af Amer: 23 mL/min/{1.73_m2} — ABNORMAL LOW (ref >59)
GFR calc Af Amer: 24 mL/min — ABNORMAL LOW (ref >60)
GFR calc non Af Amer: 20 mL/min/{1.73_m2} — ABNORMAL LOW (ref >59)
GFR calc non Af Amer: 20 mL/min — ABNORMAL LOW (ref >60)
Globulin, Total: 2.6 g/dL (ref 1.5–4.5)
Glucose, Bld: 173 mg/dL — ABNORMAL HIGH (ref 70–99)
Glucose: 149 mg/dL — ABNORMAL HIGH (ref 65–99)
Potassium: 4.3 mmol/L (ref 3.5–5.2)
Potassium: 3.3 mmol/L — ABNORMAL LOW (ref 3.5–5.1)
Sodium: 130 mmol/L — ABNORMAL LOW (ref 134–144)
Sodium: 131 mmol/L — ABNORMAL LOW (ref 135–145)
Total Bilirubin: 0.8 mg/dL (ref 0.3–1.2)
Total Protein: 6.6 g/dL (ref 6.0–8.5)
Total Protein: 6.6 g/dL (ref 6.5–8.1)

## 2019-03-23 LAB — GLUCOSE, CAPILLARY
Glucose-Capillary: 115 mg/dL — ABNORMAL HIGH (ref 70–99)
Glucose-Capillary: 188 mg/dL — ABNORMAL HIGH (ref 70–99)

## 2019-03-23 LAB — HEMOGLOBIN A1C
Hgb A1c MFr Bld: 6.7 % — ABNORMAL HIGH (ref 4.8–5.6)
Mean Plasma Glucose: 145.59 mg/dL

## 2019-03-23 LAB — MAGNESIUM: Magnesium: 2.2 mg/dL (ref 1.7–2.4)

## 2019-03-23 LAB — TSH: TSH: 17.24 u[IU]/mL — ABNORMAL HIGH (ref 0.450–4.500)

## 2019-03-23 LAB — LACTIC ACID, PLASMA: Lactic Acid, Venous: 1.1 mmol/L (ref 0.5–1.9)

## 2019-03-23 LAB — T4, FREE: Free T4: 1.19 ng/dL (ref 0.82–1.77)

## 2019-03-23 MED ORDER — FERROUS SULFATE 325 (65 FE) MG PO TABS
325.0000 mg | ORAL_TABLET | Freq: Two times a day (BID) | ORAL | Status: DC
  Administered 2019-03-24 – 2019-04-02 (×17): 325 mg via ORAL
  Filled 2019-03-23 (×19): qty 1

## 2019-03-23 MED ORDER — ADULT MULTIVITAMIN W/MINERALS CH
1.0000 | ORAL_TABLET | Freq: Every day | ORAL | Status: DC
  Administered 2019-03-24 – 2019-04-02 (×10): 1 via ORAL
  Filled 2019-03-23 (×11): qty 1

## 2019-03-23 MED ORDER — INSULIN ASPART 100 UNIT/ML ~~LOC~~ SOLN
0.0000 [IU] | Freq: Three times a day (TID) | SUBCUTANEOUS | Status: DC
  Administered 2019-03-24: 3 [IU] via SUBCUTANEOUS
  Administered 2019-03-24 – 2019-03-25 (×3): 2 [IU] via SUBCUTANEOUS
  Administered 2019-03-25: 17:00:00 3 [IU] via SUBCUTANEOUS
  Administered 2019-03-25: 13:00:00 2 [IU] via SUBCUTANEOUS
  Administered 2019-03-26: 09:00:00 8 [IU] via SUBCUTANEOUS

## 2019-03-23 MED ORDER — INSULIN GLARGINE 100 UNIT/ML ~~LOC~~ SOLN
14.0000 [IU] | Freq: Every day | SUBCUTANEOUS | Status: DC
  Administered 2019-03-23 – 2019-03-25 (×3): 14 [IU] via SUBCUTANEOUS
  Filled 2019-03-23 (×4): qty 0.14

## 2019-03-23 MED ORDER — POTASSIUM CHLORIDE CRYS ER 20 MEQ PO TBCR
20.0000 meq | EXTENDED_RELEASE_TABLET | Freq: Two times a day (BID) | ORAL | Status: DC
  Administered 2019-03-23: 20 meq via ORAL
  Filled 2019-03-23: qty 1

## 2019-03-23 MED ORDER — CLOPIDOGREL BISULFATE 75 MG PO TABS
75.0000 mg | ORAL_TABLET | Freq: Every day | ORAL | Status: DC
  Administered 2019-03-24 – 2019-04-02 (×10): 75 mg via ORAL
  Filled 2019-03-23 (×10): qty 1

## 2019-03-23 MED ORDER — GABAPENTIN 600 MG PO TABS
1200.0000 mg | ORAL_TABLET | Freq: Every day | ORAL | Status: DC
  Administered 2019-03-23 – 2019-04-01 (×10): 1200 mg via ORAL
  Filled 2019-03-23 (×10): qty 2

## 2019-03-23 MED ORDER — TRAMADOL HCL 50 MG PO TABS
50.0000 mg | ORAL_TABLET | Freq: Four times a day (QID) | ORAL | Status: DC | PRN
  Administered 2019-03-27 – 2019-04-01 (×5): 50 mg via ORAL
  Filled 2019-03-23 (×5): qty 1

## 2019-03-23 MED ORDER — DOCUSATE SODIUM 100 MG PO CAPS
100.0000 mg | ORAL_CAPSULE | Freq: Two times a day (BID) | ORAL | Status: DC
  Administered 2019-03-23 – 2019-04-02 (×19): 100 mg via ORAL
  Filled 2019-03-23 (×20): qty 1

## 2019-03-23 MED ORDER — ACETAMINOPHEN 325 MG PO TABS: 650.0000 mg | ORAL_TABLET | ORAL | Status: DC | PRN

## 2019-03-23 MED ORDER — ONDANSETRON HCL 4 MG/2ML IJ SOLN: 4.0000 mg | Freq: Four times a day (QID) | INTRAMUSCULAR | Status: DC | PRN

## 2019-03-23 MED ORDER — SODIUM CHLORIDE 0.9% FLUSH
3.0000 mL | Freq: Two times a day (BID) | INTRAVENOUS | Status: DC
  Administered 2019-03-23 – 2019-04-02 (×10): 3 mL via INTRAVENOUS

## 2019-03-23 MED ORDER — ENOXAPARIN SODIUM 30 MG/0.3ML ~~LOC~~ SOLN
30.0000 mg | Freq: Every day | SUBCUTANEOUS | Status: DC
  Administered 2019-03-23: 30 mg via SUBCUTANEOUS
  Filled 2019-03-23: qty 0.3

## 2019-03-23 MED ORDER — GABAPENTIN 600 MG PO TABS
600.0000 mg | ORAL_TABLET | Freq: Every day | ORAL | Status: DC
  Administered 2019-03-24 – 2019-04-02 (×10): 600 mg via ORAL
  Filled 2019-03-23 (×10): qty 1

## 2019-03-23 MED ORDER — FUROSEMIDE 10 MG/ML IJ SOLN
120.0000 mg | Freq: Two times a day (BID) | INTRAVENOUS | Status: DC
  Administered 2019-03-23 – 2019-03-26 (×6): 120 mg via INTRAVENOUS
  Filled 2019-03-23 (×4): qty 10
  Filled 2019-03-23: qty 12
  Filled 2019-03-23 (×2): qty 10

## 2019-03-23 MED ORDER — SODIUM CHLORIDE 0.9% FLUSH: 3.0000 mL | INTRAVENOUS | Status: DC | PRN

## 2019-03-23 MED ORDER — ROSUVASTATIN CALCIUM 20 MG PO TABS
40.0000 mg | ORAL_TABLET | Freq: Every day | ORAL | Status: DC
  Administered 2019-03-24 – 2019-04-01 (×8): 40 mg via ORAL
  Filled 2019-03-23 (×9): qty 2

## 2019-03-23 MED ORDER — SODIUM CHLORIDE 0.9 % IV SOLN: 250.0000 mL | INTRAVENOUS | Status: DC | PRN

## 2019-03-23 MED ORDER — METOLAZONE 5 MG PO TABS
5.0000 mg | ORAL_TABLET | Freq: Every day | ORAL | Status: DC
  Administered 2019-03-23 – 2019-03-26 (×4): 5 mg via ORAL
  Filled 2019-03-23 (×4): qty 1

## 2019-03-23 MED ORDER — ASPIRIN EC 81 MG PO TBEC
81.0000 mg | DELAYED_RELEASE_TABLET | Freq: Every day | ORAL | Status: DC
  Administered 2019-03-24 – 2019-04-02 (×10): 81 mg via ORAL
  Filled 2019-03-23 (×11): qty 1

## 2019-03-23 NOTE — Progress Notes (Signed)
Orthopedic Tech Progress Note Patient Details:  [name] [birth year] [id number]  Ortho Devices Type of Ortho Device: Haematologist Ortho Device/Splint Location: bi-lateral Ortho Device/Splint Interventions: Ordered, Application, Adjustment   Post Interventions Patient Tolerated: Well Instructions Provided: Care of device, Adjustment of device   Karolee Stamps 03/23/2019, 9:27 PM

## 2019-03-23 NOTE — Patient Instructions (Signed)
Medication Instructions:  - no changes  If you need a refill on your cardiac medications before your next appointment, please call your pharmacy.   Lab work: - none ordered  If you have labs (blood work) drawn today and your tests are completely normal, you will receive your results only by: Marland Kitchen MyChart Message (if you have MyChart) OR . A paper copy in the mail If you have any lab test that is abnormal or we need to change your treatment, we will call you to review the results.  Testing/Procedures: - none ordered  Follow-Up: At [facility], you and your health needs are our priority.  As part of our continuing mission to provide you with exceptional heart care, we have created designated Provider Care Teams.  These Care Teams include your primary Cardiologist (physician) and Advanced Practice Providers (APPs -  Physician Assistants and Nurse Practitioners) who all work together to provide you with the care you need, when you need it. Marland Kitchen Pending hospital discharge  Any Other Special Instructions Will Be Listed Below (If Applicable). - You are being admitted to [facility] for further management of your Heart Failure. - We will call you when a bed is available

## 2019-03-23 NOTE — Telephone Encounter (Signed)
Please give verbal orders. thanks

## 2019-03-23 NOTE — Telephone Encounter (Signed)
Melinda with [facility] calling Is aware patient had a virtual visit with Dr Caryl Comes today Wanted to update office on her findings States patient is still very pale, lower and upper extremities are cold to the touch +4 bilateral pitting edema and SOB is more extreme Please call to discuss at [phone]

## 2019-03-23 NOTE — Telephone Encounter (Signed)
Spoke with [place] manager at home health and she wanted to call and give her assessment from visit today with patient. Reviewed that they are in the process of working on setting up some things and her note is in the chart.Steffanie Dunn also notified that plan is in place for this patient. No further questions.

## 2019-03-23 NOTE — Telephone Encounter (Signed)
Patient had an e-visit today with Dr. Caryl Comes. She is being admitted to [facility] for further management of her CHF.

## 2019-03-23 NOTE — Telephone Encounter (Signed)
I spoke with the patient and advised her Dr. Caryl Comes had called me back after her e-visit and after a discussion with Dr. Haroldine Laws, they feel she will be best served as an in patient. She is aware I will call to get her a bed assignment at [facility] to further manage her CHF.   She is agreeable.   I spoke with Dawn in Bed Control at [name]. The patient will be admitted to cardiac step down- 6E 25.  The patient has been made aware and advised to proceed to [facility] in [place] as soon as she can- Social research officer, government- then to Admitting to register. She is aware she will need to be dropped off and no one can stay with her at this time due to COVID-19 restrictions.  The patient voices understanding of the above and is agreeable.

## 2019-03-23 NOTE — Progress Notes (Signed)
Electrophysiology TeleHealth Note   Due to national recommendations of social distancing due to COVID 19, an audio/video telehealth visit is felt to be most appropriate for this patient at this time.  See MyChart message from today for the patient's consent to telehealth for [facility].   Date:  03/23/2019   ID:  [name], DOB [birth year], MRN [id number]  Location: patient's home  Provider location: [address], [place]  Evaluation Performed: Follow-up visit  PCP:  Rutherford Guys, MD  Cardiologist:   TG Electrophysiologist:  SK   Chief Complaint:  CHF  History of Present Illness:    [name] is a 63 y.o. female who presents via audio/video conferencing for a telehealth visit today.  Since last being seen in our clinic for ICD and heart failure the patient reports *continued ongoing severe dyspnea at less than 20 feet.  She is sleeping in a recliner.  Abdominal distention and peripheral edema.  Started on metolazone last week 5 mg.  No augmentation of diuresis.  Working on fluid restriction.  Weight is down a few pounds.  History of nonischemic cardiomyopathy with prior CRT associated with interval improvement in LV function.  And developed MSSA bacteremia x2, the first ever had been to not to remove her device.  Subsequent was transferred to [facility] and underwent aspiration of tricuspid valve/V thrombus and device extraction.  Hospitalized 5/20 for acute on chronic heart failure  Recently she was hospitalized 6/20 for urosepsis; blood cultures were negative x2.  Urine culture was positive for E. Coli.  Course complicated by ATN with a peak creatinine of 2.56 (had been as high as four 2/20 and 3.87/16)  and a encephalopathy that resolved  Dr. Deidre Ala and RA-PA have been actively involved in her heart failure management with diuresis.  DATE TEST EF   9/15 echo 30 %   1 /17 echo 50 %   12/19 Echo 45-50%   12/19 TEE  No vegetations  12/19 LHC   pLAD80 with ISR; mLAD70 pCx 80;D1 90 mRCA 40  3/20 Echo  40%   6/20 Echo  10-15% PA press 45  BAE  TR mod-severe   Date Cr K Hgb   03/12/19 97/1.8 4.2    03/22/19 101/2.47   11.4   TSH17.4 << 4.68  The patient denies symptoms of fevers, chills, cough, or new SOB worrisome for COVID 19.    Past Medical History:  Diagnosis Date  . Acute myocardial infarction, subendocardial infarction, initial episode of care ([place]) 07/21/2012  . Acute osteomyelitis involving ankle and foot ([place]) 02/06/2015  . Acute systolic heart failure ([place]) 07/21/2012   New onset 07/19/12; admission to [facility] ED. Elevated Troponins.  S/p 2D-echo with EF 20-25%.  S/p cardiac catheterization with stenting LAD.  Repeat 2D-echo 10/2011 with improved EF of 35%.   . Anemia   . Automatic implantable cardioverter-defibrillator in situ    a. MDT CRT-D 06/2014, SN: [id number]  .removed in feb 2020  . CAD (coronary artery disease)    a. cardiac cath 101/04/2012: PCI/DES to chronically occluded mLAD, consideration PCI to diag branch in 4 weeks.   . Cataract   . Chicken pox   . Chronic kidney disease   . Chronic systolic CHF (congestive heart failure) (HCC)    a. mixed ICM & NICM; b. EF 20-25% by echo 07/2012, mid-dist 2/3 of LV sev HK/AK, mild MR. echo 10/2012: EF 30-35%, sev HK ant-septal & inf walls, GR1DD,  mild MR, PASP 33. c. echo 02/2013: EF 30%, GR1DD, mild MR. echo 04/2014: EF 30%, Septal-lat dyssynchrony, global HK, inf AK, GR1DD, mild MR. d. echo 10/2014: EF50-55%, WM nl, GR1DD, septal mild paradox. e. echo 02/2015: EF 50-55%, wm   . Depression   . Heart attack ([place])   . Heel ulcer ([place]) 04/27/2015  . History of blood transfusion ~ 2011   "plasma; had neuropathy; couldn't walk"  . Hypertension   . LBBB (left bundle branch block)   . Neuromuscular disorder ([place])   . Neuropathy 2011  . Obesity, unspecified   . OSA on CPAP    Moderate with AHI 23/hr and now on CPAP at 16cm H2O.  Her DME is [facility]  . Pure  hypercholesterolemia   . Sepsis ([place]) 09/2018  . Type II diabetes mellitus ([place])   . Unspecified vitamin D deficiency     Past Surgical History:  Procedure Laterality Date  . AMPUTATION TOE Right 06/18/2015   Procedure: AMPUTATION TOE;  Surgeon: Samara Deist, DPM;  Location: [facility] ORS;  Service: Podiatry;  Laterality: Right;  . AMPUTATION TOE Left 10/08/2018   Procedure: AMPUTATION TOE LEFT 2ND;  Surgeon: Albertine Patricia, DPM;  Location: [facility] ORS;  Service: Podiatry;  Laterality: Left;  . BACK SURGERY    . BI-VENTRICULAR IMPLANTABLE CARDIOVERTER DEFIBRILLATOR N/A 07/06/2014   Procedure: BI-VENTRICULAR IMPLANTABLE CARDIOVERTER DEFIBRILLATOR  (CRT-D);  Surgeon: Deboraha Sprang, MD;  Location: [facility] CATH LAB;  Service: Cardiovascular;  Laterality: N/A;  . BI-VENTRICULAR IMPLANTABLE CARDIOVERTER DEFIBRILLATOR  (CRT-D)  07/06/2014  . BILATERAL OOPHORECTOMY  01/2011   ovarian cyst benign  . COLONOSCOPY WITH PROPOFOL Left 02/22/2015   Procedure: COLONOSCOPY WITH PROPOFOL;  Surgeon: Hulen Luster, MD;  Location: [facility] ENDOSCOPY;  Service: Endoscopy;  Laterality: Left;  . CORONARY ANGIOPLASTY WITH STENT PLACEMENT Left 07/2012   new onset systolic CHF; elevated troponins.  Cardiac catheterization with stenting to LAD; EF 15%.  2D-echo: EF 20-25%.  . ESOPHAGOGASTRODUODENOSCOPY N/A 02/22/2015   Procedure: ESOPHAGOGASTRODUODENOSCOPY (EGD);  Surgeon: Hulen Luster, MD;  Location: [facility] ENDOSCOPY;  Service: Endoscopy;  Laterality: N/A;  . INCISION AND DRAINAGE ABSCESS Right 2007   groin; with ICU stay due to sepsis.  Marland Kitchen LAPAROSCOPIC CHOLECYSTECTOMY  2011  . LEFT HEART CATH AND CORONARY ANGIOGRAPHY N/A 10/05/2018   Procedure: LEFT HEART CATH AND CORONARY ANGIOGRAPHY;  Surgeon: Minna Merritts, MD;  Location: [place] CV LAB;  Service: Cardiovascular;  Laterality: N/A;  . LEFT HEART CATHETERIZATION WITH CORONARY ANGIOGRAM N/A 07/21/2012   Procedure: LEFT HEART CATHETERIZATION WITH CORONARY ANGIOGRAM;  Surgeon: Jolaine Artist, MD;  Location: [facility] CATH LAB;  Service: Cardiovascular;  Laterality: N/A;  . [place]   L4-5  . PACEMAKER REMOVAL  11/2018   due to infection around pacemaker  . PERCUTANEOUS CORONARY STENT INTERVENTION (PCI-S) N/A 07/23/2012   Procedure: PERCUTANEOUS CORONARY STENT INTERVENTION (PCI-S);  Surgeon: Sherren Mocha, MD;  Location: [facility] CATH LAB;  Service: Cardiovascular;  Laterality: N/A;  . PERIPHERAL VASCULAR BALLOON ANGIOPLASTY Left 10/06/2018   Procedure: PERIPHERAL VASCULAR BALLOON ANGIOPLASTY;  Surgeon: Katha Cabal, MD;  Location: [place] CV LAB;  Service: Cardiovascular;  Laterality: Left;  . PERIPHERAL VASCULAR CATHETERIZATION N/A 02/10/2015   Procedure: Picc Line Insertion;  Surgeon: Katha Cabal, MD;  Location: [place] CV LAB;  Service: Cardiovascular;  Laterality: N/A;  . TEE WITHOUT CARDIOVERSION N/A 10/02/2018   Procedure: TRANSESOPHAGEAL ECHOCARDIOGRAM (TEE);  Surgeon: Wellington Hampshire, MD;  Location: [facility] ORS;  Service: Cardiovascular;  Laterality: N/A;  . TUBAL LIGATION  1981  . VAGINAL HYSTERECTOMY  01/2011   Fibroids/DUB.  Ovaries removed. [place].    Current Outpatient Medications  Medication Sig Dispense Refill  . aspirin EC 81 MG EC tablet Take 1 tablet (81 mg total) by mouth daily. 30 tablet 0  . Calcium Carbonate-Vitamin D3 (CALCIUM 600-D) 600-400 MG-UNIT TABS Take 1 tablet by mouth 2 (two) times a day.    . clopidogrel (PLAVIX) 75 MG tablet Take 1 tablet (75 mg total) by mouth daily. 90 tablet 0  . docusate sodium (COLACE) 100 MG capsule Take 1 capsule (100 mg total) by mouth 2 (two) times daily. 60 capsule 3  . ferrous sulfate 325 (65 FE) MG tablet Take 325 mg by mouth 2 (two) times daily with a meal.    . gabapentin (NEURONTIN) 600 MG tablet Take 1 tablet (600 mg total) by mouth 2 (two) times daily. Takes 1 in the morning and two at bedtime (Patient taking differently: Take 600 mg by mouth 2 (two) times daily. Take 1  tablet (600mg ) by mouth every morning and take 2 tablets (1200mg ) by mouth at bedtime) 180 tablet 1  . glimepiride (AMARYL) 4 MG tablet Take 4 mg by mouth 2 (two) times daily.    Marland Kitchen glucose blood (ONE TOUCH ULTRA TEST) test strip USE AS DIRECTED TO CHECK BLOOD SUGAR 3 TIMES DAILY 300 each 11  . insulin glargine (LANTUS) 100 UNIT/ML injection Inject 0.16 mLs (16 Units total) into the skin at bedtime. (Patient taking differently: Inject 14 Units into the skin at bedtime. ) 10 mL 11  . metoprolol tartrate (LOPRESSOR) 25 MG tablet Take 1 tablet (25 mg total) by mouth 2 (two) times daily. 60 tablet 0  . Multiple Vitamin (MULTIVITAMIN) tablet Take 1 tablet by mouth daily.    . Omega-3 Fatty Acids (FISH OIL) 1000 MG CAPS Take 1 capsule by mouth 2 (two) times a day.     . potassium chloride (K-DUR) 10 MEQ tablet Take 1 tablet (10 meq) by mouth once daily    . rosuvastatin (CRESTOR) 40 MG tablet TAKE 1 TABLET BY MOUTH ONCE DAILY (Patient taking differently: Take 40 mg by mouth daily. ) 30 tablet 0  . torsemide (DEMADEX) 20 MG tablet Take 2 tablets (40 mg) by mouth twice daily    . traMADol (ULTRAM) 50 MG tablet Take 1 tablet (50 mg total) by mouth every 6 (six) hours as needed. 120 tablet 5  . metolazone (ZAROXOLYN) 5 MG tablet Take 1 tablet (5 mg total) by mouth daily for 2 days. Take 30 minutes before Torsemide. 2 tablet 0   No current facility-administered medications for this visit.     Allergies:   Patient has no known allergies.   Social History:  The patient  reports that she has never smoked. She has never used smokeless tobacco. She reports that she does not drink alcohol or use drugs.   Family History:  The patient's   family history includes Arthritis in her mother; COPD in her maternal grandmother; Cancer in her father and maternal grandfather; Cancer (age of onset: 24) in her brother; Diabetes in her maternal grandmother, mother, paternal grandfather, paternal grandmother, and son; Heart  disease in her maternal grandmother; Hypertension in her mother and son; Obesity in her brother.   ROS:  Please see the history of present illness.   All other systems are personally reviewed and negative.    Exam:    Vital Signs:  BP 112/80 (BP Location: Left Arm, Patient Position: Sitting, Cuff Size: Normal)   Ht 5\' 8"  (1.727 m)   Wt 293 lb (132.9 kg)   BMI 44.55 kg/m     Well appearing, alert and conversant, regular work of breathing,  good skin color Eyes- anicteric, neuro- grossly intact, skin- no apparent rash or lesions or cyanosis, mouth- oral mucosa is pink   Labs/Other Tests and Data Reviewed:    Recent Labs: 02/16/2019: B Natriuretic Peptide 1,289.0 02/20/2019: Magnesium 2.1 03/22/2019: ALT 25; BUN 101; Creatinine, Ser 2.47; Hemoglobin 12.0; Platelets 223; Potassium 4.3; Sodium 130; TSH 17.240   Wt Readings from Last 3 Encounters:  03/23/19 293 lb (132.9 kg)  03/22/19 297 lb (134.7 kg)  03/15/19 294 lb 12.8 oz (133.7 kg)     Other studies personally reviewed: Additional studies/ records that were reviewed today include: As above       ASSESSMENT & PLAN:    NonIschemic/ Ischemic cardiomyopathy- [previously  Nearly normalized   CRT-D s/p explantation [facility] 3/20 See Below    MSSA bacteremia recurrent   CHFacute/chronic systolic  LBBB  Renal Insufficiency Followed closely by renal  Hypothyroidism  Patient's cardiorenal status is significantly impaired.  She is not diuresing with torsemide and metolazone.  She remains significantly volume overloaded.  I have reached out to the heart failure service for consideration of admission.  Suspect she will need a right heart cath and inotropes as well as intravenous diuretics   in the event that she can be stabilized, would like to consider CRT reimplantation as deterioration of LV function was concurrent with extraction and had been maintained through her episodes of bacteremia.  However, currently she is  sufficiently orthopneic and would not be a safe undertaking.  Have also reached out to infectious disease regarding device reimplantation.  Importantly, blood cultures obtained at the time of her urosepsis were negative for staph.  Primary care physician check yesterday her TSH which is markedly elevated; we will begin her on low-dose Synthroid.   COVID 19 screen The patient denies symptoms of COVID 19 at this time.  The importance of social distancing was discussed today.  Follow-up:   TBD    Current medicines are reviewed at length with the patient today.   The patient does not have concerns regarding her medicines.  The following changes were made today:   Synthroid 25 mcg daily  Labs/ tests ordered today include:   No orders of the defined types were placed in this encounter.   Future tests ( post COVID )     Patient Risk:  after full review of this patients clinical status, I feel that they are at moderate  risk at this time.  Today, I have spent 11 minutes with the patient with telehealth technology discussing the above.  Signed, Virl Axe, MD  03/23/2019 12:02 PM     [facility] [address] [place] [address] [phone] (office) [phone] (fax)

## 2019-03-23 NOTE — Telephone Encounter (Signed)
Copied from [place] [id number]. Topic: Quick Communication - Home Health Verbal Orders >> Mar 23, 2019  9:45 AM Virl Axe D wrote: Caller/Agency: Raquel Sarna / Advanced Callback Number: [phone] / Secure VM Requesting OT/PT/Skilled Nursing/Social Work/Speech Therapy: PT Frequency: 1 week 1/ 2 week 2/ 1 week 1

## 2019-03-23 NOTE — H&P (Addendum)
Cardiology Admission History and Physical:   Patient ID: [name] MRN: [id number]; DOB: November 11, 1955   Admission date: 03/23/2019  Primary Care Provider: Rutherford Guys, MD Primary Cardiologist: Ida Rogue, MD 03/12/2019 in-hospital Primary Electrophysiologist:  Virl Axe, MD 03/23/2019 Advance Heart Failure: Glori Bickers, MD 12/15/2014  Chief Complaint:  CHF exacerbation  Patient Profile:   [name] is a 63 y.o. 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  Admit 09/28/2018-10/09/2018 for infectious arthropathy, MSSA sepsis w/ no cardiac vegetations on TEE, ABX till 11/19/2018. NSTEMI w/ peak trop 24.26>>LHC w/ med rx, felt demand ischemia  Admit 01/03-01/07/2019 for HCAP, acute on chronic renal failure, peak Cr 2.52, 1.52 at d/c  Blood cx 01/07 and 02/11 were negative.  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 S/p Angiovac 02/28 with aspiration of TV and lead vegetations, then had CRT device explanted 03/06. She did ABX x 6 weeks after that, Life Vest recommended. Consider device replacement after infection resolved.   04/15 office visit to re-establish CPAP  04/20 ER visit for LE edema and SOB, wt  304, up 17 kg, got a dose of IV Lasix and d/c home since pt could walk and maintain O2 sats  Admitted 05/12-05/19 for CHF exacerbation, wt down 10 kg at d/c to 135 kg, 298 lbs. BUN/Cr 50/2.03 admit >>70/1.79 at d/c. Torsemide 80 mg bid, metolazone  5 mg prn, atrial tach vs atrial flutter>>Toprol XL, monitor ordered  05/28 office visit, wt 284, SBP 90s so off Toprol  Admitted 06/02-06/08 for UTI sepsis, CHF and CKD III.  Virtual Office visit 06/16 w/ Dr Casilda Carls for CHF exacerbation.  [name] is SOB w/ minimal exertion. Her weight is up at least 10 lbs. She has orthopnea, PND. Significant LE edema. Tries to eat low-Na diet. She has not had chest pain.    DATE TEST EF   06/2014 echo 30 %   1 /2017 echo 50 %   09/2018 Echo 45-50%   09/2018 TEE  No vegetations  09/2018 LHC  pLAD80 with ISR; mLAD70 pCx 80;D1 31 mRCA 40  12/2018 Echo  40%   03/2019 Echo  10-15% PA press 45  BAE  TR mod-severe     Past Medical History:  Diagnosis Date   Acute myocardial infarction, subendocardial infarction, initial episode of care ([place]) 07/21/2012   Acute osteomyelitis involving ankle and foot ([place]) 10/12/6061   Acute systolic heart failure ([place]) 07/21/2012   New onset 07/19/12; admission to [facility] ED. Elevated Troponins.  S/p 2D-echo with EF 20-25%.  S/p cardiac catheterization with stenting LAD.  Repeat 2D-echo 10/2011 with improved EF of 35%.    Anemia    Automatic implantable cardioverter-defibrillator in situ  a. MDT CRT-D 06/2014, SN: [id number]  .removed in feb 2020   CAD (coronary artery disease)    a. cardiac cath 101/04/2012: PCI/DES to chronically occluded mLAD, consideration PCI to diag branch in 4 weeks.    Cataract    Chicken pox    Chronic kidney disease    Chronic systolic CHF (congestive heart failure) (HCC)    a. mixed ICM & NICM; b. EF 20-25% by echo 07/2012, mid-dist 2/3 of LV sev HK/AK, mild MR. echo 10/2012: EF 30-35%, sev HK ant-septal & inf  walls, GR1DD, mild MR, PASP 33. c. echo 02/2013: EF 30%, GR1DD, mild MR. echo 04/2014: EF 30%, Septal-lat dyssynchrony, global HK, inf AK, GR1DD, mild MR. d. echo 10/2014: EF50-55%, WM nl, GR1DD, septal mild paradox. e. echo 02/2015: EF 50-55%, wm    Depression    Heart attack ([place])    Heel ulcer ([place]) 04/27/2015   History of blood transfusion ~ 2011   "plasma; had neuropathy; couldn't walk"   Hypertension    LBBB (left bundle branch block)    Neuromuscular disorder ([place])    Neuropathy 2011   Obesity, unspecified    OSA on CPAP    Moderate with AHI 23/hr and now on CPAP at 16cm H2O.  Her DME is [facility]   Pure hypercholesterolemia    Sepsis ([place]) 09/2018   Type II diabetes mellitus ([place])    Unspecified vitamin D deficiency     Past Surgical History:  Procedure Laterality Date   AMPUTATION TOE Right 06/18/2015   Procedure: AMPUTATION TOE;  Surgeon: Samara Deist, DPM;  Location: [facility] ORS;  Service: Podiatry;  Laterality: Right;   AMPUTATION TOE Left 10/08/2018   Procedure: AMPUTATION TOE LEFT 2ND;  Surgeon: Albertine Patricia, DPM;  Location: [facility] ORS;  Service: Podiatry;  Laterality: Left;   BACK SURGERY     BI-VENTRICULAR IMPLANTABLE CARDIOVERTER DEFIBRILLATOR N/A 07/06/2014   Procedure: BI-VENTRICULAR IMPLANTABLE CARDIOVERTER DEFIBRILLATOR  (CRT-D);  Surgeon: Deboraha Sprang, MD;  Location: [facility] CATH LAB;  Service: Cardiovascular;  Laterality: N/A;   BI-VENTRICULAR IMPLANTABLE CARDIOVERTER DEFIBRILLATOR  (CRT-D)  07/06/2014   BILATERAL OOPHORECTOMY  01/2011   ovarian cyst benign   COLONOSCOPY WITH PROPOFOL Left 02/22/2015   Procedure: COLONOSCOPY WITH PROPOFOL;  Surgeon: Hulen Luster, MD;  Location: [facility] ENDOSCOPY;  Service: Endoscopy;  Laterality: Left;   CORONARY ANGIOPLASTY WITH STENT PLACEMENT Left 07/2012   new onset systolic CHF; elevated troponins.  Cardiac catheterization with stenting to LAD; EF 15%.  2D-echo: EF 20-25%.   ESOPHAGOGASTRODUODENOSCOPY N/A 02/22/2015    Procedure: ESOPHAGOGASTRODUODENOSCOPY (EGD);  Surgeon: Hulen Luster, MD;  Location: [facility] ENDOSCOPY;  Service: Endoscopy;  Laterality: N/A;   INCISION AND DRAINAGE ABSCESS Right 2007   groin; with ICU stay due to sepsis.   LAPAROSCOPIC CHOLECYSTECTOMY  2011   LEFT HEART CATH AND CORONARY ANGIOGRAPHY N/A 10/05/2018   Procedure: LEFT HEART CATH AND CORONARY ANGIOGRAPHY;  Surgeon: Minna Merritts, MD;  Location: [place] CV LAB;  Service: Cardiovascular;  Laterality: N/A;   LEFT HEART CATHETERIZATION WITH CORONARY ANGIOGRAM N/A 07/21/2012   Procedure: LEFT HEART CATHETERIZATION WITH CORONARY ANGIOGRAM;  Surgeon: Jolaine Artist, MD;  Location: [facility] CATH LAB;  Service: Cardiovascular;  Laterality: N/A;   LUMBAR [place]   L4-5   PACEMAKER REMOVAL  11/2018   due to infection around pacemaker   [place] (PCI-S) N/A 07/23/2012   Procedure: PERCUTANEOUS CORONARY STENT INTERVENTION (PCI-S);  Surgeon: Sherren Mocha, MD;  Location: [facility]  CATH LAB;  Service: Cardiovascular;  Laterality: N/A;   PERIPHERAL VASCULAR BALLOON ANGIOPLASTY Left 10/06/2018   Procedure: PERIPHERAL VASCULAR BALLOON ANGIOPLASTY;  Surgeon: Katha Cabal, MD;  Location: [place] CV LAB;  Service: Cardiovascular;  Laterality: Left;   PERIPHERAL VASCULAR CATHETERIZATION N/A 02/10/2015   Procedure: Picc Line Insertion;  Surgeon: Katha Cabal, MD;  Location: [place] CV LAB;  Service: Cardiovascular;  Laterality: N/A;   TEE WITHOUT CARDIOVERSION N/A 10/02/2018   Procedure: TRANSESOPHAGEAL ECHOCARDIOGRAM (TEE);  Surgeon: Wellington Hampshire, MD;  Location: [facility] ORS;  Service: Cardiovascular;  Laterality: N/A;   [place]  01/2011   Fibroids/DUB.  Ovaries removed. [place].     Medications Prior to Admission: Prior to Admission medications   Medication Sig Start Date End Date Taking? Authorizing Provider  aspirin EC 81 MG EC tablet  Take 1 tablet (81 mg total) by mouth daily. 11/24/18   Epifanio Lesches, MD  Calcium Carbonate-Vitamin D3 (CALCIUM 600-D) 600-400 MG-UNIT TABS Take 1 tablet by mouth 2 (two) times a day.    [provider]  clopidogrel (PLAVIX) 75 MG tablet Take 1 tablet (75 mg total) by mouth daily. 12/24/18   Rutherford Guys, MD  docusate sodium (COLACE) 100 MG capsule Take 1 capsule (100 mg total) by mouth 2 (two) times daily. 03/22/19   Rutherford Guys, MD  ferrous sulfate 325 (65 FE) MG tablet Take 325 mg by mouth 2 (two) times daily with a meal.    [provider]  gabapentin (NEURONTIN) 600 MG tablet Take 1 tablet (600 mg total) by mouth 2 (two) times daily. Takes 1 in the morning and two at bedtime Patient taking differently: Take 600 mg by mouth 2 (two) times daily. Take 1 tablet (600mg ) by mouth every morning and take 2 tablets (1200mg ) by mouth at bedtime 12/24/18   Rutherford Guys, MD  glimepiride (AMARYL) 4 MG tablet Take 4 mg by mouth 2 (two) times daily.    [provider]  glucose blood (ONE TOUCH ULTRA TEST) test strip USE AS DIRECTED TO CHECK BLOOD SUGAR 3 TIMES DAILY 01/09/19   Rutherford Guys, MD  insulin glargine (LANTUS) 100 UNIT/ML injection Inject 0.16 mLs (16 Units total) into the skin at bedtime. Patient taking differently: Inject 14 Units into the skin at bedtime.  11/24/18   Epifanio Lesches, MD  metolazone (ZAROXOLYN) 5 MG tablet Take 1 tablet (5 mg total) by mouth daily for 2 days. Take 30 minutes before Torsemide. 03/19/19 03/21/19  Minna Merritts, MD  metoprolol tartrate (LOPRESSOR) 25 MG tablet Take 1 tablet (25 mg total) by mouth 2 (two) times daily. 03/15/19   Fritzi Mandes, MD  Multiple Vitamin (MULTIVITAMIN) tablet Take 1 tablet by mouth daily.    [provider]  Omega-3 Fatty Acids (FISH OIL) 1000 MG CAPS Take 1 capsule by mouth 2 (two) times a day.     [provider]  potassium chloride (K-DUR) 10 MEQ tablet Take 1 tablet (10 meq)  by mouth once daily    [provider]  rosuvastatin (CRESTOR) 40 MG tablet TAKE 1 TABLET BY MOUTH ONCE DAILY Patient taking differently: Take 40 mg by mouth daily.  02/08/19   Wellington Hampshire, MD  torsemide (DEMADEX) 20 MG tablet Take 2 tablets (40 mg) by mouth twice daily 03/09/19   Rise Mu, PA-C  traMADol (ULTRAM) 50 MG tablet Take 1 tablet (50 mg total) by mouth every 6 (  six) hours as needed. 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  Social Designer, fashion/clothing strain: Not hard at all   Food insecurity    Worry: Never true    Inability: Never true   Transportation needs    Medical: No    Non-medical: No  Tobacco Use   Smoking status: Never Smoker   Smokeless tobacco: Never Used  Substance and Sexual Activity   Alcohol use: No    Alcohol/week: 0.0 standard drinks   Drug use: No   Sexual activity: Never    Birth control/protection: Post-menopausal, Surgical  Lifestyle   Physical activity    Days per week: 0 days    Minutes per session: 0 min   Stress: Rather much  Relationships   Social connections    Talks on phone: More than three times a week    Gets together: More than three times a week    Attends religious service: Never    Active member of club or organization: No    Attends meetings of clubs or organizations: Never    Relationship status: Married   Intimate partner violence    Fear of current or ex partner: No    Emotionally abused: No    Physically abused: No    Forced sexual activity: No  Other Topics Concern   Not on file    Social History Narrative   Marital status:Married x 45 yrs. Happily married, no abuse.       Children:  2 children (daughter 17, son 33). Two grandsons and 2 step grandchildren.  1 gg.      Lives: with husband, daughter, son-in-law, 2 grandsons, and 1 on the way.      Employment: disability for peripheral neuropathy 2012.  Previously had home daycare.      Tobacco: none      Alcohol: none      Drugs: none      Exercise: none. [employer].      Pets: dog.      Always uses seat belts, smoke detectors in home.      No guns in the home.       Caffeine use: 2 cups coffee per day.       Nutrition: Well balanced diet.    Family History:   The patient's family history includes Arthritis in her mother; COPD in her maternal grandmother; Cancer in her father and maternal grandfather; Cancer (age of onset: 2) in her brother; Diabetes in her maternal grandmother, mother, paternal grandfather, paternal grandmother, and son; Heart disease in her maternal grandmother; Hypertension in her mother and son; Obesity in her brother.    ROS:  Please see the history of present illness.  All other ROS reviewed and negative.     Physical Exam/Data:   Vitals:   03/23/19 1657  BP: 99/65  Pulse: 69  Resp: 18  Temp: (!) 97.5 F (36.4 C)  TempSrc: Oral  SpO2: 99%  Weight: 131.9 kg   No  intake or output data in the 24 hours ending 03/23/19 1721 Last 3 Weights 03/23/2019 03/23/2019 03/22/2019  Weight (lbs) 290 lb 12.8 oz 293 lb 297 lb  Weight (kg) 131.906 kg 132.904 kg 134.718 kg  Some encounter information is confidential and restricted. Go to Review Flowsheets activity to see all data.     Body mass index is 44.22 kg/m.  General:  Well nourished, well developed, in no acute distress at rest on O2 HEENT: normal Lymph: no adenopathy Neck: JVD 10 cm Endocrine:  No thryomegaly Vascular: No carotid bruits; upper extremity pulses 2+ bilaterally  Cardiac:  normal S1, S2; RRR; soft murmur  Lungs:   clear to auscultation bilaterally, no wheezing, rhonchi or rales  Abd: soft, nontender, no hepatomegaly  Ext: 3+ LE edema Musculoskeletal:  No deformities, BUE and BLE strength normal and equal Skin: warm and dry  Neuro:  CNs 2-12 intact, no focal abnormalities noted Psych:  Normal affect    EKG:  The ECG that was done 06/05 was personally reviewed and demonstrates SR, sinus arrhythmia, LBBB w/ QRS duration 184 ms  Relevant CV Studies:  LONG TERM MONITOR INTERPRETATION: 03/22/2019 Preliminary Findings Patient had a min HR of 69 bpm, max HR of 187 bpm, and avg HR of 97 bpm. Predominant underlying rhythm was Possible Atrial and Ventricular Pacing. 3 Ventricular Tachycardia runs occurred, the run with the fastest interval lasting 5 beats with a max rate of 187 bpm, the longest lasting 5 beats with an avg rate of 134 bpm. 443 Supraventricular Tachycardia runs occurred, the run with the fastest interval lasting 39.2 secs with a max rate of 179 bpm, the longest lasting 19 hours 25 mins with an avg rate of 120 bpm. Isolated SVEs were rare (<1.0%), SVE Couplets were rare (<1.0%), and no SVE Triplets were present. Isolated VEs were occasional (1.6%, [id number]), VE Couplets were rare (<1.0%, 5220), and VE Triplets were rare (<1.0%, 525). Ventricular Bigeminy and Trigeminy were present.  ECHO: 03/17/2019  1. The left ventricle has severely reduced systolic function, with an ejection fraction of 10-15%. The cavity was mildly dilated. There is mildly increased left ventricular wall thickness. Left ventricular diastolic Doppler parameters are indeterminate.  2. The right ventricle has severely reduced systolic function. The cavity was moderately enlarged. There is no increase in right ventricular wall thickness. Right ventricular systolic pressure is moderately elevated with an estimated pressure of 45.2  mmHg.  3. Left atrial size was mildly dilated.  4. Right atrial size was moderately dilated.  5.  The mitral valve is degenerative. Mild thickening of the mitral valve leaflet. There is mild mitral annular calcification present. Mitral valve regurgitation is moderate by color flow Doppler.  6. Tricuspid valve regurgitation is moderate-severe.  7. The aortic valve is tricuspid. Mild thickening of the aortic valve. Aortic valve regurgitation is trivial by color flow Doppler.  8. The aortic root and ascending aorta are normal in size and structure.  9. The inferior vena cava was dilated in size with <50% respiratory variability.  FINDINGS  Left Ventricle: The left ventricle has severely reduced systolic function, with an ejection fraction of 10-15%. The cavity was mildly dilated. There is mildly increased left ventricular wall thickness. Left ventricular diastolic Doppler parameters are  indeterminate.  Right Ventricle: The right ventricle has severely reduced systolic function. The cavity was moderately enlarged. There is no increase in right ventricular wall thickness. Right ventricular systolic pressure is moderately elevated with an estimated  pressure of 45.2 mmHg.  Left Atrium: Left atrial size was mildly dilated.  Right Atrium: Right atrial size was moderately dilated. Right atrial pressure is estimated at 15 mmHg.  Interatrial Septum: No atrial level shunt detected by color flow Doppler.  Pericardium: There is no evidence of pericardial effusion.  Mitral Valve: The mitral valve is degenerative in appearance. Mild thickening of the mitral valve leaflet. There is mild mitral annular calcification present. Mitral valve regurgitation is moderate by color flow Doppler.  Tricuspid Valve: The tricuspid valve is normal in structure. Tricuspid valve regurgitation is moderate-severe by color flow Doppler.  Aortic Valve: The aortic valve is tricuspid Mild thickening of the aortic valve. Aortic valve regurgitation is trivial by color flow Doppler. There is no evidence of aortic valve  stenosis.  Pulmonic Valve: The pulmonic valve was grossly normal. Pulmonic valve regurgitation is mild by color flow Doppler. No evidence of pulmonic stenosis.  Aorta: The aortic root and ascending aorta are normal in size and structure.  Venous: The inferior vena cava measures 2.40 cm, is dilated in size with less than 50% respiratory variability.    +--------------+--------++  LEFT VENTRICLE            +--------------+--------++  PLAX 2D                   +--------------+--------++  LVIDd:         5.30 cm    +--------------+--------++  LVIDs:         5.00 cm    +--------------+--------++  LV PW:         1.30 cm    +--------------+--------++  LV IVS:        1.20 cm    +--------------+--------++  LVOT diam:     2.00 cm    +--------------+--------++  LV SV:         17 ml      +--------------+--------++  LV SV Index:   6.57       +--------------+--------++  LVOT Area:     3.14 cm   +--------------+--------++                            +--------------+--------++   +------------------+---------++  LV Volumes (MOD)               +------------------+---------++  LV area d, A2C:    55.10 cm   +------------------+---------++  LV area d, A4C:    49.10 cm   +------------------+---------++  LV area s, A2C:    51.90 cm   +------------------+---------++  LV area s, A4C:    48.00 cm   +------------------+---------++  LV major d, A2C:   10.40 cm    +------------------+---------++  LV major d, A4C:   9.51 cm     +------------------+---------++  LV major s, A2C:   10.00 cm    +------------------+---------++  LV major s, A4C:   9.91 cm     +------------------+---------++  LV vol d, MOD A2C: 246.0 ml    +------------------+---------++  LV vol d, MOD A4C: 208.0 ml    +------------------+---------++  LV vol s, MOD A2C: 225.0 ml    +------------------+---------++  LV vol s, MOD A4C: 192.0 ml    +------------------+---------++  LV SV MOD A2C:     21.0 ml      +------------------+---------++  LV SV MOD A4C:     208.0 ml    +------------------+---------++  LV SV MOD BP:      29.5 ml     +------------------+---------++  +---------------+---------++  RIGHT VENTRICLE             +---------------+---------++  RV S prime:     7.87 cm/s   +---------------+---------++  TAPSE (M-mode): 1.2 cm      +---------------+---------++  RVSP:           45.2 mmHg   +---------------+---------++  +---------------+-------++-----------++  LEFT ATRIUM              Index         +---------------+-------++-----------++  LA diam:        4.90 cm  2.03 cm/m    +---------------+-------++-----------++  LA Vol (A2C):   92.2 ml  38.26 ml/m   +---------------+-------++-----------++  LA Vol (A4C):   78.2 ml  32.45 ml/m   +---------------+-------++-----------++  LA Biplane Vol: 88.3 ml  36.64 ml/m   +---------------+-------++-----------++ +------------+----------++-----------++  RIGHT ATRIUM             Index         +------------+----------++-----------++  RA Pressure: 15.00 mmHg                +------------+----------++-----------++  RA Area:     27.20 cm                 +------------+----------++-----------++  RA Volume:   92.10 ml    38.21 ml/m   +------------+----------++-----------++  +------------------+-----------++ +--------------+--------++  AORTIC VALVE                      PULMONIC VALVE            +------------------+-----------++ +--------------+--------++  AV Area (Vmax):    2.57 cm       PV Vmax:       0.63 m/s   +------------------+-----------++ +--------------+--------++  AV Area (Vmean):   2.09 cm       PV Peak grad:  1.6 mmHg   +------------------+-----------++ +--------------+--------++  AV Area (VTI):     2.13 cm      +------------------+-----------++  AV Vmax:           75.30 cm/s    +------------------+-----------++  AV Vmean:          54.500 cm/s   +------------------+-----------++  AV VTI:            0.155 m        +------------------+-----------++  AV Peak Grad:      2.3 mmHg      +------------------+-----------++  AV Mean Grad:      1.0 mmHg      +------------------+-----------++  LVOT Vmax:         61.60 cm/s    +------------------+-----------++  LVOT Vmean:        36.200 cm/s   +------------------+-----------++  LVOT VTI:          0.105 m       +------------------+-----------++  LVOT/AV VTI ratio: 0.68          +------------------+-----------++   +-------------+-------++  AORTA                   +-------------+-------++  Ao Root diam: 2.90 cm   +-------------+-------++  Ao Asc diam:  3.00 cm   +-------------+-------++  Ao Arch diam: 2.3 cm    +-------------+-------++  +---------------+-----------++  TRICUSPID VALVE               +---------------+-----------++  TR Peak grad:   30.2 mmHg     +---------------+-----------++  TR Vmax:  275.00 cm/s   +---------------+-----------++  Estimated RAP:  15.00 mmHg    +---------------+-----------++  RVSP:           45.2 mmHg     +---------------+-----------++   +--------------+-------+  SHUNTS                  +--------------+-------+  Systemic VTI:  0.10 m   +--------------+-------+  Systemic Diam: 2.00 cm  +--------------+-------+  +---------+-------+  IVC                +---------+-------+  IVC diam: 2.40 cm  +---------+-------+   CARDIAC CATH: 10/05/2018  Prox Cx to Dist Cx lesion is 80% stenosed.  Prox LAD-1 lesion is 40% stenosed.  Prox LAD-2 lesion is 90% stenosed.  Prox LAD to Mid LAD lesion is 80% stenosed.  Mid LAD to Dist LAD lesion is 70% stenosed.  1st Diag lesion is 90% stenosed.  Ost 1st Diag lesion is 80% stenosed.  Mid RCA-1 lesion is 40% stenosed.  Mid RCA-2 lesion is 40% stenosed. Cardiac Catheterization Procedure Note Procedural Findings:  Coronary angiography:  Coronary dominance: Right  Left mainstem:   Large vessel that bifurcates into the LAD and left circumflex, no  significant disease noted  Left anterior descending (LAD):   Moderate-sized diffusely diseased vessel , patent stent in the proximal region with mild to moderate in-stent restenosis , following the stent with severe stenosis estimated at 80 to 90% .  Mid to distal vessel diffusely diseased with severe sequential stenoses throughout .  Region in the mid vessel is tortuous, concerning for prior dissection that has healed .  Diagonal vessel is small, diffusely diseased ostial and proximal region estimated at 80 to 90%.   Left circumflex (LCx): Circumflex is a small diffusely diseased vessel, OM1 is moderate in size with moderate proximal disease  Right coronary artery (RCA):  Right dominant vessel with PL and PDA, mild diffuse disease, mild to moderate mid to distal disease estimated 40%  Left ventriculography: Aortic valve was crossed, no significant aortic valve stenosis, LVEDP measured,  no significant aortic valve stenosis  Final Conclusions:   Severe disease of LAD and diagonal branch Patent proximal LAD stent with severe stenosis at the distal edge of the stent Remainder of the LAD is also small, diffusely diseased, diagonal branch diffusely diseased -RCA large vessel dominant with mild disease, left circumflex/OM with mild disease proximal region   Recommendations:  Case discussed with interventional cardiology, Placing a short stent in the distal edge of the LAD stent may provide little clinical benefit with high risk and may delay surgery on her osteomyelitis/toe infection. Troponin elevation likely secondary to demand ischemia in the setting of febrile, tachycardic, hypertensive on arrival in the setting of bacteremia --Would favor medical management at this time and proceeding with removal of her toe.   Diagnostic Dominance: Right  PV CATH/PTA: 10/06/2018 Findings: The abdominal aorta is opacified with a bolus injection contrast. Renal arteries are single and widely patent. The  aorta itself has diffuse disease but no hemodynamically significant lesions. The common and external iliac arteries are widely patent bilaterally.  The left common femoral is widely patent as is the profunda femoris.  The SFA does indeed have a significant stenosis at multiple locations as described above.  The distal popliteal demonstrates increasing disease and the trifurcation is heavily diseased with greater than 90% stenosis of the anterior tibial, the peroneal demonstrates diffuse disease with a small caliber that does not contribute much to the flow  to the foot and the posterior tibial is patent without hemodynamically significant stenosis and fills the lateral plantar.  On initial imaging the posterior tibial is the dominant tibial vessel to the foot.    Following angioplasty anterior tibial now is in-line flow and looks quite nice with less than 5% residual stenosis and is now equally contributing to the flow to the foot with the posterior tibial. Angioplasty of the SFA at Hunter's canal yields an excellent result with less than 10% residual stenosis.    Summary: Successful recanalization left lower extremity for limb salvage  Laboratory Data:  Chemistry Recent Labs  Lab 03/22/19 1159  NA 130*  K 4.3  CL 84*  CO2 21  GLUCOSE 149*  BUN 101*  CREATININE 2.47*  CALCIUM 9.5  GFRNONAA 20*  GFRAA 23*    Recent Labs  Lab 03/22/19 1159  PROT 6.6  ALBUMIN 4.0  AST 25  ALT 25  ALKPHOS 73  BILITOT 0.5   Hematology Recent Labs  Lab 03/22/19 1159  WBC 9.4  RBC 4.46  HGB 12.0  HCT 37.2  MCV 83  MCH 26.9  MCHC 32.3  RDW 14.4  PLT 223   Cardiac EnzymesNo results for input(s): TROPONINI in the last 168 hours. No results for input(s): TROPIPOC in the last 168 hours.  BNPNo results for input(s): BNP, PROBNP in the last 168 hours.  DDimer No results for input(s): DDIMER in the last 168 hours.  Radiology/Studies:  No results found.  Assessment and Plan:   1. Acute  on chronic CHF - Echo 6/15 EF 10-15% (down from 40-45% prior to removal of CRT) - admit, diurese with IV Lasix - follow BMET, I/O, daily wts  2. Acute on chronic CKD III - follow closely w/ diuresis - baseline creatinine 1.7-1.8  3. NICM/ICM - needs another device - do BB as BP will allow - no ACE/ARB/Entresto due to elevated Cr  4. CAD - last cath in 12/19 with severe LAD mid to distal LAD disease after previous stent. No PCI options - continue statin and DAPT  5. DM2 - continue lantus - cover with SSI - check Hgba1c  6. H/o PAD - s/p bilateral toe amputations due to non-healing wounds  7. Morbid obesity  For questions or updates, please contact [employer] Please consult [email or web address] for contact info under     Signed, Rosaria Ferries, PA-C  03/23/2019 5:21 PM    Patient seen and examined with the above-signed Advanced Practice Provider and/or Housestaff. I personally reviewed laboratory data, imaging studies and relevant notes. I independently examined the patient and formulated the important aspects of the plan. I have edited the note to reflect any of my changes or salient points. I have personally discussed the plan with the patient and/or family.  Difficult situation.  63 y/o woman with obesity, DM2, CAD, CKD 3-4. LBBB (400 ms) and systolic HF.   Has h/o mixed ICM/NICM with low EF. Had CRT device placed 2015 with EF improved to 45-50%. Unfortunately device extracted in 2/20 due to staph sepsis from osteo. Since that time has done poorly from HF perspective. Admitted to [facility] last week with volume overload, AKI and E.coli UTI. Echo showed EF 10-15% with severe RV dysfunction. Moderate MR and moderate to severe TR   Had televisit with Dr. Caryl Comes today and was doing poorly with recurrent volume overload and Class IV HF symptoms. Creatinine climbing again. Direct admitted for further HF management.   On exam Weak appearing but in  NAD HEENT: normal Neck: supple. JVP  to ear with prominent CV waves Carotids 2+ bilat; no bruits. No lymphadenopathy or thryomegaly appreciated. Cor: PMI nondisplaced. Regular rate & rhythm. 2/6 TR Lungs: basilar crackles  Abdomen: obese soft, nontender, nondistended. No hepatosplenomegaly. No bruits or masses. Good bowel sounds. Extremities: no cyanosis, clubbing, rash, 3+ edema L>R Neuro: alert & orientedx3, cranial nerves grossly intact. moves all 4 extremities w/o difficulty. Affect pleasant  She has recurrent severe biventricular HF in setting of mixed ICM/NICM and LBBB. EF improved previously with CRT but now back down again after CRT removal due to sepsis.She has marked volume overload on exam and rising creatinine. Possibly low output. Will start with IV lasix 120 IV bid and metolazone. Place UNNA boots. If poor response to diuretics will need invasive monitoring and inotrope support. Ideally we will be able to replace CRT at some point but her tendency for recurrent infections is concerning.   Glori Bickers, MD  6:55 PM

## 2019-03-24 ENCOUNTER — Ambulatory Visit: Payer: PPO | Admitting: Family

## 2019-03-24 LAB — GLUCOSE, CAPILLARY
Glucose-Capillary: 135 mg/dL — ABNORMAL HIGH (ref 70–99)
Glucose-Capillary: 133 mg/dL — ABNORMAL HIGH (ref 70–99)
Glucose-Capillary: 134 mg/dL — ABNORMAL HIGH (ref 70–99)
Glucose-Capillary: 209 mg/dL — ABNORMAL HIGH (ref 70–99)

## 2019-03-24 LAB — BASIC METABOLIC PANEL
Anion gap: 17 — ABNORMAL HIGH (ref 5–15)
BUN: 105 mg/dL — ABNORMAL HIGH (ref 8–23)
CO2: 28 mmol/L (ref 22–32)
Calcium: 9.5 mg/dL (ref 8.9–10.3)
Chloride: 89 mmol/L — ABNORMAL LOW (ref 98–111)
Creatinine, Ser: 2.02 mg/dL — ABNORMAL HIGH (ref 0.44–1.00)
GFR calc Af Amer: 29 mL/min — ABNORMAL LOW (ref >60)
GFR calc non Af Amer: 25 mL/min — ABNORMAL LOW (ref >60)
Glucose, Bld: 171 mg/dL — ABNORMAL HIGH (ref 70–99)
Potassium: 3 mmol/L — ABNORMAL LOW (ref 3.5–5.1)
Sodium: 134 mmol/L — ABNORMAL LOW (ref 135–145)

## 2019-03-24 MED ORDER — POTASSIUM CHLORIDE CRYS ER 20 MEQ PO TBCR
40.0000 meq | EXTENDED_RELEASE_TABLET | Freq: Two times a day (BID) | ORAL | Status: DC
  Administered 2019-03-24 (×2): 40 meq via ORAL
  Filled 2019-03-24 (×2): qty 2

## 2019-03-24 MED ORDER — ENOXAPARIN SODIUM 40 MG/0.4ML ~~LOC~~ SOLN
40.0000 mg | Freq: Every day | SUBCUTANEOUS | Status: DC
  Administered 2019-03-24 – 2019-04-01 (×9): 40 mg via SUBCUTANEOUS
  Filled 2019-03-24 (×9): qty 0.4

## 2019-03-24 MED ORDER — METOPROLOL TARTRATE 5 MG/5ML IV SOLN
5.0000 mg | Freq: Once | INTRAVENOUS | Status: AC
  Administered 2019-03-24: 5 mg via INTRAVENOUS
  Filled 2019-03-24: qty 5

## 2019-03-24 NOTE — Progress Notes (Signed)
Patient is on NIV at this time tolerating it well. No distress or complications noted.

## 2019-03-24 NOTE — Telephone Encounter (Signed)
Verbal given 

## 2019-03-24 NOTE — Progress Notes (Signed)
Advanced Heart Failure Rounding Note   Subjective:    Diuresing well on IV lasix. Breathing better. Weight recorded as up 1 pound. UNNA boots placed.   No orthopnea or PND. Renal function improving.     Objective:   Weight Range:  Vital Signs:   Temp:  [97.3 F (36.3 C)-97.5 F (36.4 C)] 97.3 F (36.3 C) (06/16 1956) Pulse Rate:  [67-72] 72 (06/17 0535) Resp:  [18] 18 (06/16 1657) BP: (99-103)/(63-66) 103/66 (06/17 0535) SpO2:  [99 %-100 %] 100 % (06/17 0535) Weight:  [131.9 kg-132.4 kg] 132.4 kg (06/17 0535) Last BM Date: 03/21/19(via pt)  Weight change: Filed Weights   03/23/19 1657 03/24/19 0535  Weight: 131.9 kg 132.4 kg    Intake/Output:   Intake/Output Summary (Last 24 hours) at 03/24/2019 1243 Last data filed at 03/24/2019 1000 Gross per 24 hour  Intake 734.7 ml  Output 1325 ml  Net -590.3 ml     Physical Exam: General:  Sitting up in bed. No resp difficulty HEENT: normal Neck: supple. JVP 9-10 . Carotids 2+ bilat; no bruits. No lymphadenopathy or thryomegaly appreciated. Cor: PMI nondisplaced. Regular rate & rhythm. No rubs, gallops or murmurs. Lungs: clear Abdomen: obese soft, nontender, nondistended. No hepatosplenomegaly. No bruits or masses. Good bowel sounds. Extremities: no cyanosis, clubbing, rash, 2+ edema + UNNA boots Neuro: alert & orientedx3, cranial nerves grossly intact. moves all 4 extremities w/o difficulty. Affect pleasant  Telemetry: sinus 70-80 Personally reviewed   Labs: Basic Metabolic Panel: Recent Labs  Lab 03/22/19 1159 03/23/19 2127 03/24/19 0357  NA 130* 131* 134*  K 4.3 3.3* 3.0*  CL 84* 88* 89*  CO2 21 29 28   GLUCOSE 149* 173* 171*  BUN 101* 111* 105*  CREATININE 2.47* 2.40* 2.02*  CALCIUM 9.5 9.7 9.5  MG  --  2.2  --     Liver Function Tests: Recent Labs  Lab 03/22/19 1159 03/23/19 2127  AST 25 18  ALT 25 22  ALKPHOS 73 57  BILITOT 0.5 0.8  PROT 6.6 6.6  ALBUMIN 4.0 3.5   No results for  input(s): LIPASE, AMYLASE in the last 168 hours. No results for input(s): AMMONIA in the last 168 hours.  CBC: Recent Labs  Lab 03/22/19 1159 03/23/19 2127  WBC 9.4 5.6  HGB 12.0 11.6*  HCT 37.2 36.3  MCV 83 83.8  PLT 223 185    Cardiac Enzymes: No results for input(s): CKTOTAL, CKMB, CKMBINDEX, TROPONINI in the last 168 hours.  BNP: BNP (last 3 results) Recent Labs    01/22/19 0929 01/25/19 1603 02/16/19 1115  BNP 1,208.0* 971.0* 1,289.0*    ProBNP (last 3 results) No results for input(s): PROBNP in the last 8760 hours.    Other results:  Imaging:  No results found.   Medications:     Scheduled Medications: . aspirin EC  81 mg Oral Daily  . clopidogrel  75 mg Oral Daily  . docusate sodium  100 mg Oral BID  . enoxaparin (LOVENOX) injection  40 mg Subcutaneous QHS  . ferrous sulfate  325 mg Oral BID WC  . gabapentin  1,200 mg Oral QHS  . gabapentin  600 mg Oral Daily  . insulin aspart  0-15 Units Subcutaneous TID WC  . insulin glargine  14 Units Subcutaneous QHS  . metolazone  5 mg Oral Daily  . multivitamin with minerals  1 tablet Oral Daily  . potassium chloride  40 mEq Oral BID  . rosuvastatin  40 mg  Oral q1800  . sodium chloride flush  3 mL Intravenous Q12H     Infusions: . sodium chloride    . furosemide 120 mg (03/24/19 0844)     PRN Medications:  sodium chloride, acetaminophen, ondansetron (ZOFRAN) IV, sodium chloride flush, traMADol   Assessment/Plan:   1. Acute on chronic CHF due to mixed ICM.NICM with LBBB (160ms) - Echo 6/15 EF 10-15% (down from 40-45% prior to removal of CRT) - Symptomatically improved with IV diuresis but weight unchanged - Continue IV lasix.  - No ACE/ARB with AKI. No b-blocker with ADHF - Can consider HDL/NTG as BP tolerates - follow BMET, I/O, daily wts - will eventually need CRT replacement. Will discuss timing with Dr. Caryl Comes  2. Acute on chronic CKD III - Creatinine 2.4 -> 2.0 follow closely w/  diuresis - baseline creatinine 1.7-1.8  3. CAD - no s/s ischmia - last cath in 12/19 with severe LAD mid to distal LAD disease after previous stent. No PCI options - continue statin and DAPT  5. DM2 - continue lantus - cover with SSI - HgBa1c 6.7  6. H/o PAD - s/p bilateral toe amputations due to non-healing wounds  7. Morbid obesity  8. Hypokalemia - will supp. Can add spiro soon  Length of Stay: 1   Glori Bickers MD 03/24/2019, 12:43 PM  Advanced Heart Failure Team Pager [phone] (M-F; 7a - 4p)  Please contact [facility] for night-coverage after hours (4p -7a ) and weekends on [email or web address]

## 2019-03-24 NOTE — Progress Notes (Signed)
    S:  CTSP due to "feeling bad" and tachycardia.  On questioning, she says that she noted palpitations/elevated hr's earlier this evening and this quickly became associated with mild dyspnea, and nausea.  HRs have been elevated in the 1-teens and review of tele reveals atrial tachycardia beginning @ 17:22 @ ~ 117-118.  O:   Vitals:   03/24/19 1858 03/24/19 1900  BP:  104/67  Pulse: (!) 114 (!) 113  Resp:    Temp:    SpO2: (!) 86% 94%   Pleasant, nad, aaox3. Lungs w/ crackles 1/3 way up bilat. Cor RRR, tachy.  A/P:  1. PAT: pt w/ a h/o PAT prev documented on tele @ [place] in May.  She had been on  blocker w/ recent Zio showing prob SVT vs PAT vs Af/Flutter.  In the setting of low output CHF,  blocker has been on hold.  She has been tachycardic @ 117 since 17:22.  Appears to be atrial tach on tele.  ECG now to document arrhythmia.  BP stable @ 997 systolic currently.  I will give lopressor 5mg  IV x 1 now.  Will likely need resumption of  blocker vs antiarrhythmic pending response.  Murray Hodgkins, NP

## 2019-03-24 NOTE — Progress Notes (Signed)
   03/24/19 1900  Vitals  BP 104/67  MAP (mmHg) 78  Pulse Rate (!) 113  ECG Heart Rate (!) 114  Oxygen Therapy  SpO2 94 %  MEWS Score  MEWS RR 1  MEWS Pulse 2  MEWS Systolic 0  MEWS LOC 0  MEWS Temp 0  MEWS Score 3  MEWS Score Color Yellow   Patient HR elevated from trending in the ~ 80's and oxygen decreased to the mid 80's. RN placed on 2L [place]. Paged PA Sharolyn Douglas. Night shift reported of changes and will continue to monitor the patient closely.   Saddie Benders RN BSN

## 2019-03-25 DIAGNOSIS — I509 Heart failure, unspecified: Secondary | ICD-10-CM

## 2019-03-25 LAB — BASIC METABOLIC PANEL
Anion gap: 15 (ref 5–15)
Anion gap: 13 (ref 5–15)
BUN: 100 mg/dL — ABNORMAL HIGH (ref 8–23)
BUN: 100 mg/dL — ABNORMAL HIGH (ref 8–23)
CO2: 29 mmol/L (ref 22–32)
CO2: 32 mmol/L (ref 22–32)
Calcium: 9.3 mg/dL (ref 8.9–10.3)
Calcium: 9.2 mg/dL (ref 8.9–10.3)
Chloride: 89 mmol/L — ABNORMAL LOW (ref 98–111)
Chloride: 89 mmol/L — ABNORMAL LOW (ref 98–111)
Creatinine, Ser: 1.75 mg/dL — ABNORMAL HIGH (ref 0.44–1.00)
Creatinine, Ser: 1.85 mg/dL — ABNORMAL HIGH (ref 0.44–1.00)
GFR calc Af Amer: 35 mL/min — ABNORMAL LOW (ref >60)
GFR calc Af Amer: 33 mL/min — ABNORMAL LOW (ref >60)
GFR calc non Af Amer: 30 mL/min — ABNORMAL LOW (ref >60)
GFR calc non Af Amer: 28 mL/min — ABNORMAL LOW (ref >60)
Glucose, Bld: 155 mg/dL — ABNORMAL HIGH (ref 70–99)
Glucose, Bld: 178 mg/dL — ABNORMAL HIGH (ref 70–99)
Potassium: 2.8 mmol/L — ABNORMAL LOW (ref 3.5–5.1)
Potassium: 3.2 mmol/L — ABNORMAL LOW (ref 3.5–5.1)
Sodium: 133 mmol/L — ABNORMAL LOW (ref 135–145)
Sodium: 134 mmol/L — ABNORMAL LOW (ref 135–145)

## 2019-03-25 LAB — CBC
HCT: 34.7 % — ABNORMAL LOW (ref 36.0–46.0)
Hemoglobin: 11.2 g/dL — ABNORMAL LOW (ref 12.0–15.0)
MCH: 27.1 pg (ref 26.0–34.0)
MCHC: 32.3 g/dL (ref 30.0–36.0)
MCV: 84 fL (ref 80.0–100.0)
Platelets: 183 10*3/uL (ref 150–400)
RBC: 4.13 MIL/uL (ref 3.87–5.11)
RDW: 15.7 % — ABNORMAL HIGH (ref 11.5–15.5)
WBC: 7.5 10*3/uL (ref 4.0–10.5)
nRBC: 0 % (ref 0.0–0.2)

## 2019-03-25 LAB — MAGNESIUM: Magnesium: 2.1 mg/dL (ref 1.7–2.4)

## 2019-03-25 LAB — GLUCOSE, CAPILLARY
Glucose-Capillary: 127 mg/dL — ABNORMAL HIGH (ref 70–99)
Glucose-Capillary: 135 mg/dL — ABNORMAL HIGH (ref 70–99)
Glucose-Capillary: 163 mg/dL — ABNORMAL HIGH (ref 70–99)
Glucose-Capillary: 324 mg/dL — ABNORMAL HIGH (ref 70–99)

## 2019-03-25 LAB — NOVEL CORONAVIRUS, NAA (HOSP ORDER, SEND-OUT TO REF LAB; TAT 18-24 HRS): SARS-CoV-2, NAA: NOT DETECTED

## 2019-03-25 MED ORDER — AMIODARONE LOAD VIA INFUSION
150.0000 mg | Freq: Once | INTRAVENOUS | Status: AC
  Administered 2019-03-25: 150 mg via INTRAVENOUS
  Filled 2019-03-25: qty 83.34

## 2019-03-25 MED ORDER — AMIODARONE HCL IN DEXTROSE 360-4.14 MG/200ML-% IV SOLN
30.0000 mg/h | INTRAVENOUS | Status: DC
  Administered 2019-03-26 – 2019-04-02 (×15): 30 mg/h via INTRAVENOUS
  Filled 2019-03-25 (×16): qty 200

## 2019-03-25 MED ORDER — POTASSIUM CHLORIDE CRYS ER 20 MEQ PO TBCR
40.0000 meq | EXTENDED_RELEASE_TABLET | ORAL | Status: AC
  Administered 2019-03-25 (×4): 40 meq via ORAL
  Filled 2019-03-25 (×4): qty 2

## 2019-03-25 MED ORDER — AMIODARONE HCL IN DEXTROSE 360-4.14 MG/200ML-% IV SOLN
60.0000 mg/h | INTRAVENOUS | Status: AC
  Administered 2019-03-25 (×2): 60 mg/h via INTRAVENOUS
  Filled 2019-03-25: qty 200

## 2019-03-25 MED ORDER — METOPROLOL TARTRATE 5 MG/5ML IV SOLN
5.0000 mg | Freq: Once | INTRAVENOUS | Status: DC
  Filled 2019-03-25: qty 5

## 2019-03-25 NOTE — Consult Note (Signed)
[facility] CM Inpatient Consult   03/25/2019  [name] 1956-06-30 [id number]    Patient was reviewed forreadmission within 30 days, with 47% extreme high risk score ofunplanned readmission; has 8 hospitalizationsand 2 ED visits in the past 6 months; and to check for potential [facility] care management services needed under herHealth Team Advantage plan. Patient was previously outreached and engaged with [facility] care management in the past. Our community based plan of care has focused on disease management and community resource support.  Chart review and History & Physical dated 03/23/19,showas follows: [name] is a 63 y.o. female with MI>>LAD DES, LBBB, mixed NICM/ICM combined systolic and diastolic CHF, status post CRT-D 2015 with improvement in EF to 40 to 45%>>10-15% after ICD removed 2nd bacteremia,, DM 2, HTN, obesity, PAD w/ PTA L anterior tibial artery. 10/06/2018, OSA prescribed CPAP, NSTEMI 09/2018 s/p cath w/ med rx for CAD>> 2/2 MSSA bacteremia s/p toe amputation, TV and lead vegetations, s/p Angiovac & ICD removal at [facility].      [name] was hospitalized multiple times in 2020.  She had a Education administrator visit on 06/16 w/ Dr Effie Berkshire for CHF exacerbation. [name] is SOB w/ minimal exertion. Her weight is up at least 10 lbs. She has orthopnea, PND. Significant LE edema. Tries to eat low-Na diet. She has not had chest pain.    Her primary care provider is Dr.Irma Ukraine with [facility] at [facility], listed as providing transition of care.  Notedthat patient is actively followed by an external care management program through her insurance provider and is currentlyan Engaged [facility] HTApatient. She will be followed by [facility] in the community,with full case management services.  [facility] care management services are not appropriate at this time.   For questions and additional information, please contact:  Analie Katzman A. Luby Seamans, BSN, RN-BC  [facility] Cell: [phone]

## 2019-03-25 NOTE — Progress Notes (Signed)
Prior to RN administering x1 dose of IV push Metoprolol, HR sustaining 90's-100's, RN explained to patient heart rate would continue to be observed and if heart rate sustained greater than 110, RN would administer, patient agreeable.

## 2019-03-25 NOTE — Progress Notes (Signed)
Advanced Heart Failure Rounding Note   Subjective:    Developed atrial tach last night. Treated with IV lopressor. Still atrial trach 100-120  Remains on IV lasix and metolazone. Weight down another 3 pounds. Breathing better. Creatinine down to 1.7. K 2.8  No orthopnea or PND.    Objective:   Weight Range:  Vital Signs:   Temp:  [97.8 F (36.6 C)-99.1 F (37.3 C)] 97.8 F (36.6 C) (06/18 0255) Pulse Rate:  [81-114] 109 (06/18 0255) Resp:  [18-22] 21 (06/18 0255) BP: (104-122)/(67-71) 111/71 (06/18 0255) SpO2:  [86 %-99 %] 98 % (06/18 0255) Weight:  [130.7 kg] 130.7 kg (06/18 0318) Last BM Date: 03/21/19  Weight change: Filed Weights   03/23/19 1657 03/24/19 0535 03/25/19 0318  Weight: 131.9 kg 132.4 kg 130.7 kg    Intake/Output:   Intake/Output Summary (Last 24 hours) at 03/25/2019 1013 Last data filed at 03/25/2019 0900 Gross per 24 hour  Intake 1233 ml  Output 3050 ml  Net -1817 ml     Physical Exam: General:  Sitting up in bed. No resp difficulty HEENT: normal Neck: supple. JVP to jaw with prominent V waves Carotids 2+ bilat; no bruits. No lymphadenopathy or thryomegaly appreciated. Cor: PMI nondisplaced. Irregular tachy. No rubs, gallops or murmurs. Lungs: clear Abdomen: obese soft, nontender, nondistended. No hepatosplenomegaly. No bruits or masses. Good bowel sounds. Extremities: no cyanosis, clubbing, rash, 1+ edema Neuro: alert & orientedx3, cranial nerves grossly intact. moves all 4 extremities w/o difficulty. Affect pleasant  Telemetry: Atrial tach 100-120 Personally reviewed   Labs: Basic Metabolic Panel: Recent Labs  Lab 03/22/19 1159 03/23/19 2127 03/24/19 0357 03/25/19 0520  NA 130* 131* 134* 133*  K 4.3 3.3* 3.0* 2.8*  CL 84* 88* 89* 89*  CO2 21 29 28 29   GLUCOSE 149* 173* 171* 155*  BUN 101* 111* 105* 100*  CREATININE 2.47* 2.40* 2.02* 1.75*  CALCIUM 9.5 9.7 9.5 9.3  MG  --  2.2  --  2.1    Liver Function Tests: Recent  Labs  Lab 03/22/19 1159 03/23/19 2127  AST 25 18  ALT 25 22  ALKPHOS 73 57  BILITOT 0.5 0.8  PROT 6.6 6.6  ALBUMIN 4.0 3.5   No results for input(s): LIPASE, AMYLASE in the last 168 hours. No results for input(s): AMMONIA in the last 168 hours.  CBC: Recent Labs  Lab 03/22/19 1159 03/23/19 2127 03/25/19 0520  WBC 9.4 5.6 7.5  HGB 12.0 11.6* 11.2*  HCT 37.2 36.3 34.7*  MCV 83 83.8 84.0  PLT 223 185 183    Cardiac Enzymes: No results for input(s): CKTOTAL, CKMB, CKMBINDEX, TROPONINI in the last 168 hours.  BNP: BNP (last 3 results) Recent Labs    01/22/19 0929 01/25/19 1603 02/16/19 1115  BNP 1,208.0* 971.0* 1,289.0*    ProBNP (last 3 results) No results for input(s): PROBNP in the last 8760 hours.    Other results:  Imaging: No results found.   Medications:     Scheduled Medications: . aspirin EC  81 mg Oral Daily  . clopidogrel  75 mg Oral Daily  . docusate sodium  100 mg Oral BID  . enoxaparin (LOVENOX) injection  40 mg Subcutaneous QHS  . ferrous sulfate  325 mg Oral BID WC  . gabapentin  1,200 mg Oral QHS  . gabapentin  600 mg Oral Daily  . insulin aspart  0-15 Units Subcutaneous TID WC  . insulin glargine  14 Units Subcutaneous QHS  .  metolazone  5 mg Oral Daily  . metoprolol tartrate  5 mg Intravenous Once  . multivitamin with minerals  1 tablet Oral Daily  . potassium chloride  40 mEq Oral Q4H  . rosuvastatin  40 mg Oral q1800  . sodium chloride flush  3 mL Intravenous Q12H    Infusions: . sodium chloride    . furosemide 120 mg (03/25/19 0842)    PRN Medications: sodium chloride, acetaminophen, ondansetron (ZOFRAN) IV, sodium chloride flush, traMADol   Assessment/Plan:   1. Acute on chronic CHF due to mixed ICM.NICM with LBBB (117ms) - Echo 6/15 EF 10-15% (down from 40-45% prior to removal of CRT) - Symptomatically improved with IV diuresis. Weight coming down but still volume overloaded - Continue IV lasix.  - No ACE/ARB  with AKI. No b-blocker with ADHF - Can consider HDL/NTG as BP tolerates - follow BMET, I/O, daily wts - will eventually need CRT replacement. Will discuss timing with Dr. Caryl Comes  2. Acute on chronic CKD III - Creatinine 2.4 -> 2.0 -> 1.75 -> 1.85 follow closely w/ diuresis - baseline creatinine 1.7-1.8  3. PAT - recurrent - start amio. D/w Dr Caryl Comes - Keep K> 4.0 Mg > 2.0   4. CAD - no ss/s ischemia   - last cath in 12/19 with severe LAD mid to distal LAD disease after previous stent. No PCI options - continue statin and DAPT  5. DM2 - continue lantus - cover with SSI - HgBa1c 6.7  6. H/o PAD - s/p bilateral toe amputations due to non-healing wounds  7. Morbid obesity  8. Hypokalemia - will supp aggressively. Continue spiro   Length of Stay: 2   Glori Bickers MD 03/25/2019, 10:13 AM  Advanced Heart Failure Team Pager [phone] (M-F; [place])  Please contact [facility] for night-coverage after hours (4p -7a ) and weekends on [email or web address]

## 2019-03-25 NOTE — Progress Notes (Signed)
Post Metoprolol dose patient received earlier this shift, heart rate primarily in the 90's.  Patient called RN stating she was starting to feel the way she did earlier yesterday evening prior to receiving that dose of Metoprolol.  HR sustaining around 109-115 at this time.  RN text paged Cardiology with this information.  Verbal order received and order entered per RN.

## 2019-03-26 ENCOUNTER — Telehealth: Payer: Self-pay | Admitting: Nurse Practitioner

## 2019-03-26 LAB — GLUCOSE, CAPILLARY
Glucose-Capillary: 259 mg/dL — ABNORMAL HIGH (ref 70–99)
Glucose-Capillary: 235 mg/dL — ABNORMAL HIGH (ref 70–99)
Glucose-Capillary: 280 mg/dL — ABNORMAL HIGH (ref 70–99)
Glucose-Capillary: 187 mg/dL — ABNORMAL HIGH (ref 70–99)
Glucose-Capillary: 212 mg/dL — ABNORMAL HIGH (ref 70–99)

## 2019-03-26 LAB — BASIC METABOLIC PANEL
Anion gap: 13 (ref 5–15)
BUN: 104 mg/dL — ABNORMAL HIGH (ref 8–23)
CO2: 30 mmol/L (ref 22–32)
Calcium: 9 mg/dL (ref 8.9–10.3)
Chloride: 92 mmol/L — ABNORMAL LOW (ref 98–111)
Creatinine, Ser: 2.03 mg/dL — ABNORMAL HIGH (ref 0.44–1.00)
GFR calc Af Amer: 29 mL/min — ABNORMAL LOW (ref >60)
GFR calc non Af Amer: 25 mL/min — ABNORMAL LOW (ref >60)
Glucose, Bld: 291 mg/dL — ABNORMAL HIGH (ref 70–99)
Potassium: 3.7 mmol/L (ref 3.5–5.1)
Sodium: 135 mmol/L (ref 135–145)

## 2019-03-26 MED ORDER — POTASSIUM CHLORIDE CRYS ER 20 MEQ PO TBCR
40.0000 meq | EXTENDED_RELEASE_TABLET | Freq: Once | ORAL | Status: AC
  Administered 2019-03-26: 13:00:00 40 meq via ORAL
  Filled 2019-03-26: qty 2

## 2019-03-26 MED ORDER — INSULIN ASPART 100 UNIT/ML ~~LOC~~ SOLN
0.0000 [IU] | Freq: Every day | SUBCUTANEOUS | Status: DC
  Administered 2019-03-26 – 2019-03-29 (×3): 2 [IU] via SUBCUTANEOUS
  Administered 2019-03-31: 3 [IU] via SUBCUTANEOUS
  Administered 2019-04-01: 2 [IU] via SUBCUTANEOUS

## 2019-03-26 MED ORDER — INSULIN ASPART 100 UNIT/ML ~~LOC~~ SOLN
0.0000 [IU] | Freq: Three times a day (TID) | SUBCUTANEOUS | Status: DC
  Administered 2019-03-26: 4 [IU] via SUBCUTANEOUS
  Administered 2019-03-26: 11 [IU] via SUBCUTANEOUS
  Administered 2019-03-27 (×2): 7 [IU] via SUBCUTANEOUS
  Administered 2019-03-27: 4 [IU] via SUBCUTANEOUS
  Administered 2019-03-28: 3 [IU] via SUBCUTANEOUS
  Administered 2019-03-28: 12:00:00 4 [IU] via SUBCUTANEOUS
  Administered 2019-03-28: 3 [IU] via SUBCUTANEOUS
  Administered 2019-03-29: 14:00:00 7 [IU] via SUBCUTANEOUS
  Administered 2019-03-29: 10:00:00 4 [IU] via SUBCUTANEOUS
  Administered 2019-03-29: 17:00:00 3 [IU] via SUBCUTANEOUS
  Administered 2019-03-30 (×2): 4 [IU] via SUBCUTANEOUS
  Administered 2019-03-30: 7 [IU] via SUBCUTANEOUS
  Administered 2019-03-31 – 2019-04-01 (×3): 4 [IU] via SUBCUTANEOUS
  Administered 2019-04-01 (×2): 11 [IU] via SUBCUTANEOUS
  Administered 2019-04-02 (×2): 4 [IU] via SUBCUTANEOUS

## 2019-03-26 MED ORDER — INSULIN GLARGINE 100 UNIT/ML ~~LOC~~ SOLN
14.0000 [IU] | Freq: Every day | SUBCUTANEOUS | Status: DC
  Administered 2019-03-26 – 2019-04-01 (×7): 14 [IU] via SUBCUTANEOUS
  Filled 2019-03-26 (×8): qty 0.14

## 2019-03-26 MED ORDER — POTASSIUM CHLORIDE CRYS ER 20 MEQ PO TBCR
20.0000 meq | EXTENDED_RELEASE_TABLET | Freq: Once | ORAL | Status: AC
  Administered 2019-03-26: 20 meq via ORAL
  Filled 2019-03-26: qty 1

## 2019-03-26 NOTE — Progress Notes (Addendum)
Advanced Heart Failure Rounding Note   Subjective:    Remains on IV lasix. Weight down 6 pounds. Feeling better. No SOB, orthopnea or PND.   On IV amiodarone for atrial tach. Now back in NSR.   Creatinine 1.8 -> 2.0  Objective:   Weight Range:  Vital Signs:   Temp:  [97.5 F (36.4 C)-98.5 F (36.9 C)] 98.5 F (36.9 C) (06/19 1335) Pulse Rate:  [84-112] 84 (06/19 1335) Resp:  [18-20] 20 (06/19 1335) BP: (102-125)/(70-76) 119/70 (06/19 1335) SpO2:  [93 %-98 %] 98 % (06/19 1335) Weight:  [129.4 kg] 129.4 kg (06/19 0458) Last BM Date: 03/24/19  Weight change: Filed Weights   03/24/19 0535 03/25/19 0318 03/26/19 0458  Weight: 132.4 kg 130.7 kg 129.4 kg    Intake/Output:   Intake/Output Summary (Last 24 hours) at 03/26/2019 1537 Last data filed at 03/26/2019 1300 Gross per 24 hour  Intake 521.11 ml  Output 1100 ml  Net -578.89 ml     Physical Exam: General:  Sitting up in bed. No resp difficulty HEENT: normal Neck: supple. JVP 9 with prominent CV wave  Carotids 2+ bilat; no bruits. No lymphadenopathy or thryomegaly appreciated. Cor: PMI nondisplaced. Regular rate & rhythm. 2/6 TR/MR Lungs: clear Abdomen: obese soft, nontender, nondistended. No hepatosplenomegaly. No bruits or masses. Good bowel sounds. Extremities: no cyanosis, clubbing, rash, edema +UNNA boots Neuro: alert & orientedx3, cranial nerves grossly intact. moves all 4 extremities w/o difficulty. Affect pleasant  Telemetry: Sinus 80s Personally reviewed   Labs: Basic Metabolic Panel: Recent Labs  Lab 03/23/19 2127 03/24/19 0357 03/25/19 0520 03/25/19 1408 03/26/19 0410  NA 131* 134* 133* 134* 135  K 3.3* 3.0* 2.8* 3.2* 3.7  CL 88* 89* 89* 89* 92*  CO2 29 28 29  32 30  GLUCOSE 173* 171* 155* 178* 291*  BUN 111* 105* 100* 100* 104*  CREATININE 2.40* 2.02* 1.75* 1.85* 2.03*  CALCIUM 9.7 9.5 9.3 9.2 9.0  MG 2.2  --  2.1  --   --     Liver Function Tests: Recent Labs  Lab 03/22/19 1159  03/23/19 2127  AST 25 18  ALT 25 22  ALKPHOS 73 57  BILITOT 0.5 0.8  PROT 6.6 6.6  ALBUMIN 4.0 3.5   No results for input(s): LIPASE, AMYLASE in the last 168 hours. No results for input(s): AMMONIA in the last 168 hours.  CBC: Recent Labs  Lab 03/22/19 1159 03/23/19 2127 03/25/19 0520  WBC 9.4 5.6 7.5  HGB 12.0 11.6* 11.2*  HCT 37.2 36.3 34.7*  MCV 83 83.8 84.0  PLT 223 185 183    Cardiac Enzymes: No results for input(s): CKTOTAL, CKMB, CKMBINDEX, TROPONINI in the last 168 hours.  BNP: BNP (last 3 results) Recent Labs    01/22/19 0929 01/25/19 1603 02/16/19 1115  BNP 1,208.0* 971.0* 1,289.0*    ProBNP (last 3 results) No results for input(s): PROBNP in the last 8760 hours.    Other results:  Imaging: No results found.   Medications:     Scheduled Medications: . aspirin EC  81 mg Oral Daily  . clopidogrel  75 mg Oral Daily  . docusate sodium  100 mg Oral BID  . enoxaparin (LOVENOX) injection  40 mg Subcutaneous QHS  . ferrous sulfate  325 mg Oral BID WC  . gabapentin  1,200 mg Oral QHS  . gabapentin  600 mg Oral Daily  . insulin aspart  0-20 Units Subcutaneous TID WC  . insulin aspart  0-5  Units Subcutaneous QHS  . insulin glargine  14 Units Subcutaneous QHS  . metoprolol tartrate  5 mg Intravenous Once  . multivitamin with minerals  1 tablet Oral Daily  . potassium chloride  20 mEq Oral Once  . rosuvastatin  40 mg Oral q1800  . sodium chloride flush  3 mL Intravenous Q12H    Infusions: . sodium chloride 10 mL/hr at 03/25/19 1855  . amiodarone 30 mg/hr (03/26/19 1424)    PRN Medications: sodium chloride, acetaminophen, ondansetron (ZOFRAN) IV, sodium chloride flush, traMADol   Assessment/Plan:   1. 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 Weight down 6 pounds. JVP still up in setting of severe TR but  creatinine now going back  - Will hold IV lasix - Plan RHC on Monday. Can use milrinone over the weekend if decompensates - No ACE/ARB with AKI. No b-blocker with ADHF - Can consider HDL/NTG as BP tolerates - follow BMET, I/O, daily wts - D/w Dr. Caryl Comes. CRT replacement early to mid next week   2. Acute on chronic CKD III - Creatinine 2.4 -> 2.0 -> 1.75 -> 1.85 -. 2.0 follow closely w/ diuresis - baseline creatinine 1.7-1.8  3. PAT - recurrent - Suppressed with IV amio. Will continue. No need for [facility] for atrial tach.  D/w Dr Caryl Comes - Keep K> 4.0 Mg > 2.0   4. CAD - no ss/s ischemia   - last cath in 12/19 with severe LAD mid to distal LAD disease after previous stent. No PCI options - continue statin and DAPT  5. DM2 - Sugars are up  - increase lantus - cover with SSI - HgBa1c 6.7  6. H/o PAD - s/p bilateral toe amputations due to non-healing wounds  7. Morbid obesity  8. Hypokalemia - will supp   Length of Stay: 3   Glori Bickers MD 03/26/2019, 3:37 PM  Advanced Heart Failure Team Pager [phone] (M-F; [place])  Please contact [facility] for night-coverage after hours (4p -7a ) and weekends on [email or web address]

## 2019-03-26 NOTE — Telephone Encounter (Signed)
I attempted to reschedule the 5/21 f/u visit for Vickie Gusler, NP - patient did not answer.  Left message with reason for call along with my name and contact number.

## 2019-03-26 NOTE — Care Management Important Message (Signed)
Important Message  Patient Details  Name: [name] MRN: [id number] Date of Birth: 07/19/1956   Medicare Important Message Given:  Yes     Shelda Altes 03/26/2019, 12:14 PM

## 2019-03-26 NOTE — Progress Notes (Signed)
Orthopedic Tech Progress Note Patient Details:  [name] 1956/09/03 [id number]  Ortho Devices Type of Ortho Device: Haematologist Ortho Device/Splint Location: Bilateral unna boots Ortho Device/Splint Interventions: Application   Post Interventions Patient Tolerated: Well Instructions Provided: Care of device   [name] 03/26/2019, 12:59 PM

## 2019-03-27 LAB — BASIC METABOLIC PANEL
Anion gap: 14 (ref 5–15)
BUN: 104 mg/dL — ABNORMAL HIGH (ref 8–23)
CO2: 29 mmol/L (ref 22–32)
Calcium: 9 mg/dL (ref 8.9–10.3)
Chloride: 92 mmol/L — ABNORMAL LOW (ref 98–111)
Creatinine, Ser: 2.07 mg/dL — ABNORMAL HIGH (ref 0.44–1.00)
GFR calc Af Amer: 28 mL/min — ABNORMAL LOW (ref >60)
GFR calc non Af Amer: 25 mL/min — ABNORMAL LOW (ref >60)
Glucose, Bld: 222 mg/dL — ABNORMAL HIGH (ref 70–99)
Potassium: 3.4 mmol/L — ABNORMAL LOW (ref 3.5–5.1)
Sodium: 135 mmol/L (ref 135–145)

## 2019-03-27 LAB — GLUCOSE, CAPILLARY
Glucose-Capillary: 197 mg/dL — ABNORMAL HIGH (ref 70–99)
Glucose-Capillary: 206 mg/dL — ABNORMAL HIGH (ref 70–99)
Glucose-Capillary: 201 mg/dL — ABNORMAL HIGH (ref 70–99)
Glucose-Capillary: 184 mg/dL — ABNORMAL HIGH (ref 70–99)

## 2019-03-27 MED ORDER — POTASSIUM CHLORIDE CRYS ER 20 MEQ PO TBCR
40.0000 meq | EXTENDED_RELEASE_TABLET | Freq: Once | ORAL | Status: AC
  Administered 2019-03-27: 14:00:00 40 meq via ORAL
  Filled 2019-03-27: qty 2

## 2019-03-27 MED ORDER — FUROSEMIDE 10 MG/ML IJ SOLN
80.0000 mg | Freq: Two times a day (BID) | INTRAMUSCULAR | Status: DC
  Administered 2019-03-27 – 2019-03-28 (×4): 80 mg via INTRAVENOUS
  Filled 2019-03-27 (×4): qty 8

## 2019-03-27 MED ORDER — ASPIRIN 81 MG PO CHEW: 81.0000 mg | CHEWABLE_TABLET | ORAL | Status: DC

## 2019-03-27 MED ORDER — SODIUM CHLORIDE 0.9 % IV SOLN: 250.0000 mL | INTRAVENOUS | Status: DC | PRN

## 2019-03-27 MED ORDER — MILRINONE LACTATE IN DEXTROSE 20-5 MG/100ML-% IV SOLN
0.1250 ug/kg/min | INTRAVENOUS | Status: DC
  Administered 2019-03-27 – 2019-04-02 (×9): 0.125 ug/kg/min via INTRAVENOUS
  Filled 2019-03-27 (×10): qty 100

## 2019-03-27 MED ORDER — SODIUM CHLORIDE 0.9% FLUSH
3.0000 mL | Freq: Two times a day (BID) | INTRAVENOUS | Status: DC
  Administered 2019-03-28: 23:00:00 3 mL via INTRAVENOUS

## 2019-03-27 MED ORDER — SODIUM CHLORIDE 0.9 % IV SOLN: INTRAVENOUS | Status: DC

## 2019-03-27 MED ORDER — SODIUM CHLORIDE 0.9% FLUSH: 3.0000 mL | INTRAVENOUS | Status: DC | PRN

## 2019-03-27 MED ORDER — POTASSIUM CHLORIDE CRYS ER 20 MEQ PO TBCR
40.0000 meq | EXTENDED_RELEASE_TABLET | Freq: Once | ORAL | Status: AC
  Administered 2019-03-27: 40 meq via ORAL
  Filled 2019-03-27: qty 2

## 2019-03-27 MED ORDER — ASPIRIN 81 MG PO CHEW
81.0000 mg | CHEWABLE_TABLET | ORAL | Status: AC
  Administered 2019-03-29: 06:00:00 81 mg via ORAL
  Filled 2019-03-27: qty 1

## 2019-03-27 NOTE — Progress Notes (Signed)
Advanced Heart Failure Rounding Note   Subjective:    Lasix stopped yesterday due to AKI. Weight up 4 pounds. Breathing ok. No orthopnea or PND. Creatinine up slightly 2.03 -> 2.07  On IV amiodarone for atrial tach. Maintaining NSR.    Objective:   Weight Range:  Vital Signs:   Temp:  [97.6 F (36.4 C)-98.5 F (36.9 C)] 97.6 F (36.4 C) (06/20 0518) Pulse Rate:  [84-96] 92 (06/20 0812) Resp:  [17-20] 17 (06/20 0518) BP: (103-125)/(67-76) 103/67 (06/20 0812) SpO2:  [91 %-99 %] 96 % (06/20 0812) Weight:  [131.1 kg] 131.1 kg (06/20 0518) Last BM Date: 03/25/19  Weight change: Filed Weights   03/25/19 0318 03/26/19 0458 03/27/19 0518  Weight: 130.7 kg 129.4 kg 131.1 kg    Intake/Output:   Intake/Output Summary (Last 24 hours) at 03/27/2019 0827 Last data filed at 03/27/2019 0800 Gross per 24 hour  Intake 1931.22 ml  Output 1800 ml  Net 131.22 ml     Physical Exam: General:  Sitting up in bed. No resp difficulty HEENT: normal Neck: supple. JVP 9 with prominent CV wave  Carotids 2+ bilat; no bruits. No lymphadenopathy or thryomegaly appreciated. Cor: PMI nondisplaced. Regular rate & rhythm. 2/6 MR/TR Lungs: clear Abdomen: obese soft, nontender, nondistended. No hepatosplenomegaly. No bruits or masses. Good bowel sounds. Extremities: no cyanosis, clubbing, rash, tr edema +UNNA boots  Neuro: alert & orientedx3, cranial nerves grossly intact. moves all 4 extremities w/o difficulty. Affect pleasant   Telemetry: Sinus 80-90s Personally reviewed   Labs: Basic Metabolic Panel: Recent Labs  Lab 03/23/19 2127 03/24/19 0357 03/25/19 0520 03/25/19 1408 03/26/19 0410 03/27/19 0407  NA 131* 134* 133* 134* 135 135  K 3.3* 3.0* 2.8* 3.2* 3.7 3.4*  CL 88* 89* 89* 89* 92* 92*  CO2 29 28 29  32 30 29  GLUCOSE 173* 171* 155* 178* 291* 222*  BUN 111* 105* 100* 100* 104* 104*  CREATININE 2.40* 2.02* 1.75* 1.85* 2.03* 2.07*  CALCIUM 9.7 9.5 9.3 9.2 9.0 9.0  MG 2.2  --   2.1  --   --   --     Liver Function Tests: Recent Labs  Lab 03/22/19 1159 03/23/19 2127  AST 25 18  ALT 25 22  ALKPHOS 73 57  BILITOT 0.5 0.8  PROT 6.6 6.6  ALBUMIN 4.0 3.5   No results for input(s): LIPASE, AMYLASE in the last 168 hours. No results for input(s): AMMONIA in the last 168 hours.  CBC: Recent Labs  Lab 03/22/19 1159 03/23/19 2127 03/25/19 0520  WBC 9.4 5.6 7.5  HGB 12.0 11.6* 11.2*  HCT 37.2 36.3 34.7*  MCV 83 83.8 84.0  PLT 223 185 183    Cardiac Enzymes: No results for input(s): CKTOTAL, CKMB, CKMBINDEX, TROPONINI in the last 168 hours.  BNP: BNP (last 3 results) Recent Labs    01/22/19 0929 01/25/19 1603 02/16/19 1115  BNP 1,208.0* 971.0* 1,289.0*    ProBNP (last 3 results) No results for input(s): PROBNP in the last 8760 hours.    Other results:  Imaging: No results found.   Medications:     Scheduled Medications: . [START ON 03/28/2019] aspirin  81 mg Oral Pre-Cath  . aspirin EC  81 mg Oral Daily  . clopidogrel  75 mg Oral Daily  . docusate sodium  100 mg Oral BID  . enoxaparin (LOVENOX) injection  40 mg Subcutaneous QHS  . ferrous sulfate  325 mg Oral BID WC  . gabapentin  1,200  mg Oral QHS  . gabapentin  600 mg Oral Daily  . insulin aspart  0-20 Units Subcutaneous TID WC  . insulin aspart  0-5 Units Subcutaneous QHS  . insulin glargine  14 Units Subcutaneous QHS  . metoprolol tartrate  5 mg Intravenous Once  . multivitamin with minerals  1 tablet Oral Daily  . potassium chloride  40 mEq Oral Once  . rosuvastatin  40 mg Oral q1800  . sodium chloride flush  3 mL Intravenous Q12H  . sodium chloride flush  3 mL Intravenous Q12H    Infusions: . sodium chloride 10 mL/hr at 03/25/19 1855  . sodium chloride    . sodium chloride    . amiodarone 30 mg/hr (03/27/19 0304)    PRN Medications: sodium chloride, sodium chloride, acetaminophen, ondansetron (ZOFRAN) IV, sodium chloride flush, sodium chloride flush, traMADol    Assessment/Plan:   1. 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 Creatinine relatively stable. Neck veins still up but this is confounded by severe TR - Will treat empirically with IV milrinone to see if we can optimize her further prior to CRT-D reimplan this week  - Plan RHC on Monday. - No ACE/ARB with AKI. No b-blocker with ADHF - Can consider HDL/NTG as BP tolerates - follow BMET, I/O, daily wts - D/w Dr. Caryl Comes. CRT replacement early to mid next week   2. Acute on chronic CKD III - Creatinine 2.4 -> 2.0 -> 1.75 -> 1.85 -. 2.03 -> 2.07  follow closely w/ diuresis. Start milrinone - baseline creatinine 1.7-1.8  3. PAT - recurrent - Suppressed with IV amio. Will continue. No need for [facility] for atrial tach.  D/w Dr Caryl Comes - Keep K> 4.0 Mg > 2.0   4. CAD - no ss/s ischemia   - last cath in 12/19 with severe LAD mid to distal LAD disease after previous stent. No PCI options - continue statin and DAPT  5. DM2 - Sugars are up  - Lantus adjusted yesterday and now improved - cover with SSI - HgBa1c 6.7  6. H/o PAD - s/p bilateral toe amputations due to non-healing wounds  7. Morbid obesity  8. Hypokalemia - supp  Length of Stay: 4   Glori Bickers MD 03/27/2019, 8:27 AM  Advanced Heart Failure Team Pager [phone] (M-F; 7a - 4p)  Please contact [facility] for night-coverage after hours (4p -7a ) and weekends on [email or web address]

## 2019-03-27 NOTE — Progress Notes (Signed)
Pt has 8 beats run of VT, pt's asymtomatic, resting in bed. Bensimhon, MD notified. Will continue to monitor.

## 2019-03-28 DIAGNOSIS — I472 Ventricular tachycardia: Secondary | ICD-10-CM

## 2019-03-28 LAB — BASIC METABOLIC PANEL
Anion gap: 14 (ref 5–15)
BUN: 101 mg/dL — ABNORMAL HIGH (ref 8–23)
CO2: 30 mmol/L (ref 22–32)
Calcium: 9.2 mg/dL (ref 8.9–10.3)
Chloride: 90 mmol/L — ABNORMAL LOW (ref 98–111)
Creatinine, Ser: 2.04 mg/dL — ABNORMAL HIGH (ref 0.44–1.00)
GFR calc Af Amer: 29 mL/min — ABNORMAL LOW (ref >60)
GFR calc non Af Amer: 25 mL/min — ABNORMAL LOW (ref >60)
Glucose, Bld: 218 mg/dL — ABNORMAL HIGH (ref 70–99)
Potassium: 3.6 mmol/L (ref 3.5–5.1)
Sodium: 134 mmol/L — ABNORMAL LOW (ref 135–145)

## 2019-03-28 LAB — GLUCOSE, CAPILLARY
Glucose-Capillary: 131 mg/dL — ABNORMAL HIGH (ref 70–99)
Glucose-Capillary: 195 mg/dL — ABNORMAL HIGH (ref 70–99)
Glucose-Capillary: 148 mg/dL — ABNORMAL HIGH (ref 70–99)
Glucose-Capillary: 250 mg/dL — ABNORMAL HIGH (ref 70–99)
Glucose-Capillary: 203 mg/dL — ABNORMAL HIGH (ref 70–99)

## 2019-03-28 NOTE — Progress Notes (Signed)
Advanced Heart Failure Rounding Note   Subjective:    Milrinone started on 6/20. Feels ok. Denies SOB, orthopnea or PND. Says she doesn't notice a difference. Weight stable. Off diuretics.    On IV amiodarone for atrial tach. Maintaining NSR. Had 8 beats on NSVT yesterday.   No labs yet today.    Objective:   Weight Range:  Vital Signs:   Temp:  [97.9 F (36.6 C)-98.5 F (36.9 C)] 97.9 F (36.6 C) (06/21 0510) Pulse Rate:  [87-98] 89 (06/21 0510) Resp:  [15-18] 15 (06/21 0510) BP: (103-120)/(59-77) 120/77 (06/21 0510) SpO2:  [93 %-98 %] 95 % (06/21 0510) Weight:  [131.5 kg] 131.5 kg (06/21 0510) Last BM Date: 03/25/19  Weight change: Filed Weights   03/26/19 0458 03/27/19 0518 03/28/19 0510  Weight: 129.4 kg 131.1 kg 131.5 kg    Intake/Output:   Intake/Output Summary (Last 24 hours) at 03/28/2019 0930 Last data filed at 03/28/2019 0700 Gross per 24 hour  Intake 1186.59 ml  Output 1550 ml  Net -363.41 ml     Physical Exam: General:  Sitting up in bed. No resp difficulty HEENT: normal Neck: supple. JVP 9 with prominent CV wave  Carotids 2+ bilat; no bruits. No lymphadenopathy or thryomegaly app.npe Cor: PMI nondisplaced. Regular rate & rhythm. 2/6 MR/TR Lungs: clear Abdomen: obese soft, nontender, nondistended. No hepatosplenomegaly. No bruits or masses. Good bowel sounds. Extremities: no cyanosis, clubbing, rash, tr edema + UNNA Neuro: alert & orientedx3, cranial nerves grossly intact. moves all 4 extremities w/o difficulty. Affect pleasant    Telemetry: Sinus 80-90s 8 beat run NSVT Personally reviewed   Labs: Basic Metabolic Panel: Recent Labs  Lab 03/23/19 2127 03/24/19 0357 03/25/19 0520 03/25/19 1408 03/26/19 0410 03/27/19 0407  NA 131* 134* 133* 134* 135 135  K 3.3* 3.0* 2.8* 3.2* 3.7 3.4*  CL 88* 89* 89* 89* 92* 92*  CO2 29 28 29  32 30 29  GLUCOSE 173* 171* 155* 178* 291* 222*  BUN 111* 105* 100* 100* 104* 104*  CREATININE 2.40* 2.02*  1.75* 1.85* 2.03* 2.07*  CALCIUM 9.7 9.5 9.3 9.2 9.0 9.0  MG 2.2  --  2.1  --   --   --     Liver Function Tests: Recent Labs  Lab 03/22/19 1159 03/23/19 2127  AST 25 18  ALT 25 22  ALKPHOS 73 57  BILITOT 0.5 0.8  PROT 6.6 6.6  ALBUMIN 4.0 3.5   No results for input(s): LIPASE, AMYLASE in the last 168 hours. No results for input(s): AMMONIA in the last 168 hours.  CBC: Recent Labs  Lab 03/22/19 1159 03/23/19 2127 03/25/19 0520  WBC 9.4 5.6 7.5  HGB 12.0 11.6* 11.2*  HCT 37.2 36.3 34.7*  MCV 83 83.8 84.0  PLT 223 185 183    Cardiac Enzymes: No results for input(s): CKTOTAL, CKMB, CKMBINDEX, TROPONINI in the last 168 hours.  BNP: BNP (last 3 results) Recent Labs    01/22/19 0929 01/25/19 1603 02/16/19 1115  BNP 1,208.0* 971.0* 1,289.0*    ProBNP (last 3 results) No results for input(s): PROBNP in the last 8760 hours.    Other results:  Imaging: No results found.   Medications:     Scheduled Medications: . [START ON 03/29/2019] aspirin  81 mg Oral Pre-Cath  . aspirin EC  81 mg Oral Daily  . clopidogrel  75 mg Oral Daily  . docusate sodium  100 mg Oral BID  . enoxaparin (LOVENOX) injection  40 mg  Subcutaneous QHS  . ferrous sulfate  325 mg Oral BID WC  . furosemide  80 mg Intravenous BID  . gabapentin  1,200 mg Oral QHS  . gabapentin  600 mg Oral Daily  . insulin aspart  0-20 Units Subcutaneous TID WC  . insulin aspart  0-5 Units Subcutaneous QHS  . insulin glargine  14 Units Subcutaneous QHS  . metoprolol tartrate  5 mg Intravenous Once  . multivitamin with minerals  1 tablet Oral Daily  . rosuvastatin  40 mg Oral q1800  . sodium chloride flush  3 mL Intravenous Q12H  . sodium chloride flush  3 mL Intravenous Q12H    Infusions: . sodium chloride 10 mL/hr at 03/25/19 1855  . sodium chloride    . sodium chloride    . amiodarone 30 mg/hr (03/28/19 0700)  . milrinone 0.125 mcg/kg/min (03/28/19 0700)    PRN Medications: sodium chloride,  sodium chloride, acetaminophen, ondansetron (ZOFRAN) IV, sodium chloride flush, sodium chloride flush, traMADol   Assessment/Plan:   1. 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 Weight stable. BMET pending Neck veins still up but this is confounded by severe TR - IV milrinone started on 6/20 to see if we can optimize her further prior to CRT-D reimplan this week  - Plan RHC tomorrow am - No ACE/ARB with AKI. No b-blocker with ADHF - Can consider HDL/NTG as BP tolerates - follow BMET, I/O, daily wts - D/w Dr. Caryl Comes. CRT replacement early to mid next week   2. Acute on chronic CKD III - Creatinine 2.4 -> 2.0 -> 1.75 -> 1.85 -. 2.03 -> 2.07 . BMET pending this am  - baseline creatinine 1.7-1.8  3. PAT & NSVT - recurrent - Suppressed with IV amio. Will continue. No need for [facility] for atrial tach.  D/w Dr Caryl Comes - Keep K> 4.0 Mg > 2.0   4. CAD - no ss/s ischemia   - last cath in 12/19 with severe LAD mid to distal LAD disease after previous stent. No PCI options - continue statin and DAPT  5. DM2 - Sugars are up  - Lantus adjusted yesterday and now improved - cover with SSI - HgBa1c 6.7  6. H/o PAD - s/p bilateral toe amputations due to non-healing wounds  7. Morbid obesity  8. Hypokalemia - supp as needed  Length of Stay: 5   Glori Bickers MD 03/28/2019, 9:30 AM  Advanced Heart Failure Team Pager [phone] (M-F; [place])  Please contact [facility] for night-coverage after hours (4p -7a ) and weekends on [email or web address]

## 2019-03-29 ENCOUNTER — Encounter (HOSPITAL_COMMUNITY)
Admission: AD | Disposition: A | Discharge status: Home Health | Payer: Self-pay | Source: Ambulatory Visit | Attending: Internal Medicine

## 2019-03-29 ENCOUNTER — Encounter (HOSPITAL_COMMUNITY): Payer: Self-pay | Admitting: Internal Medicine

## 2019-03-29 HISTORY — PX: RIGHT HEART CATH: CATH118263

## 2019-03-29 LAB — POCT I-STAT EG7
Acid-Base Excess: 10 mmol/L — ABNORMAL HIGH (ref 0.0–2.0)
Acid-Base Excess: 8 mmol/L — ABNORMAL HIGH (ref 0.0–2.0)
Bicarbonate: 32.9 mmol/L — ABNORMAL HIGH (ref 20.0–28.0)
Bicarbonate: 35.3 mmol/L — ABNORMAL HIGH (ref 20.0–28.0)
Calcium, Ion: 1.04 mmol/L — ABNORMAL LOW (ref 1.15–1.40)
Calcium, Ion: 1.16 mmol/L (ref 1.15–1.40)
HCT: 31 % — ABNORMAL LOW (ref 36.0–46.0)
HCT: 33 % — ABNORMAL LOW (ref 36.0–46.0)
Hemoglobin: 10.5 g/dL — ABNORMAL LOW (ref 12.0–15.0)
Hemoglobin: 11.2 g/dL — ABNORMAL LOW (ref 12.0–15.0)
O2 Saturation: 54 %
O2 Saturation: 57 %
Potassium: 2.9 mmol/L — ABNORMAL LOW (ref 3.5–5.1)
Potassium: 3.1 mmol/L — ABNORMAL LOW (ref 3.5–5.1)
Sodium: 134 mmol/L — ABNORMAL LOW (ref 135–145)
Sodium: 137 mmol/L (ref 135–145)
TCO2: 34 mmol/L — ABNORMAL HIGH (ref 22–32)
TCO2: 37 mmol/L — ABNORMAL HIGH (ref 22–32)
pCO2, Ven: 46.8 mmHg (ref 44.0–60.0)
pCO2, Ven: 49.1 mmHg (ref 44.0–60.0)
pH, Ven: 7.454 — ABNORMAL HIGH (ref 7.250–7.430)
pH, Ven: 7.464 — ABNORMAL HIGH (ref 7.250–7.430)
pO2, Ven: 28 mmHg — CL (ref 32.0–45.0)
pO2, Ven: 29 mmHg — CL (ref 32.0–45.0)

## 2019-03-29 LAB — BASIC METABOLIC PANEL
Anion gap: 13 (ref 5–15)
BUN: 96 mg/dL — ABNORMAL HIGH (ref 8–23)
CO2: 31 mmol/L (ref 22–32)
Calcium: 9.1 mg/dL (ref 8.9–10.3)
Chloride: 88 mmol/L — ABNORMAL LOW (ref 98–111)
Creatinine, Ser: 2.05 mg/dL — ABNORMAL HIGH (ref 0.44–1.00)
GFR calc Af Amer: 29 mL/min — ABNORMAL LOW (ref >60)
GFR calc non Af Amer: 25 mL/min — ABNORMAL LOW (ref >60)
Glucose, Bld: 172 mg/dL — ABNORMAL HIGH (ref 70–99)
Potassium: 3 mmol/L — ABNORMAL LOW (ref 3.5–5.1)
Sodium: 132 mmol/L — ABNORMAL LOW (ref 135–145)

## 2019-03-29 LAB — GLUCOSE, CAPILLARY
Glucose-Capillary: 163 mg/dL — ABNORMAL HIGH (ref 70–99)
Glucose-Capillary: 221 mg/dL — ABNORMAL HIGH (ref 70–99)
Glucose-Capillary: 140 mg/dL — ABNORMAL HIGH (ref 70–99)
Glucose-Capillary: 201 mg/dL — ABNORMAL HIGH (ref 70–99)

## 2019-03-29 SURGERY — RIGHT HEART CATH
Anesthesia: LOCAL

## 2019-03-29 MED ORDER — LABETALOL HCL 5 MG/ML IV SOLN: 10.0000 mg | INTRAVENOUS | Status: AC | PRN

## 2019-03-29 MED ORDER — ACETAMINOPHEN 325 MG PO TABS: 650.0000 mg | ORAL_TABLET | ORAL | Status: DC | PRN

## 2019-03-29 MED ORDER — SODIUM CHLORIDE 0.9% FLUSH: 3.0000 mL | INTRAVENOUS | Status: DC | PRN

## 2019-03-29 MED ORDER — HEPARIN (PORCINE) IN NACL 1000-0.9 UT/500ML-% IV SOLN
INTRAVENOUS | Status: DC | PRN
  Administered 2019-03-29: 500 mL

## 2019-03-29 MED ORDER — FENTANYL CITRATE (PF) 100 MCG/2ML IJ SOLN
INTRAMUSCULAR | Status: AC
  Filled 2019-03-29: qty 2

## 2019-03-29 MED ORDER — ONDANSETRON HCL 4 MG/2ML IJ SOLN: 4.0000 mg | Freq: Four times a day (QID) | INTRAMUSCULAR | Status: DC | PRN

## 2019-03-29 MED ORDER — FUROSEMIDE 10 MG/ML IJ SOLN
80.0000 mg | Freq: Two times a day (BID) | INTRAMUSCULAR | Status: DC
  Administered 2019-03-29 – 2019-03-30 (×4): 80 mg via INTRAVENOUS
  Filled 2019-03-29 (×5): qty 8

## 2019-03-29 MED ORDER — MIDAZOLAM HCL 2 MG/2ML IJ SOLN
INTRAMUSCULAR | Status: AC
  Filled 2019-03-29: qty 2

## 2019-03-29 MED ORDER — HEPARIN (PORCINE) IN NACL 1000-0.9 UT/500ML-% IV SOLN
INTRAVENOUS | Status: AC
  Filled 2019-03-29: qty 500

## 2019-03-29 MED ORDER — SODIUM CHLORIDE 0.9 % IV SOLN: 250.0000 mL | INTRAVENOUS | Status: DC | PRN

## 2019-03-29 MED ORDER — POTASSIUM CHLORIDE CRYS ER 20 MEQ PO TBCR
40.0000 meq | EXTENDED_RELEASE_TABLET | Freq: Once | ORAL | Status: AC
  Administered 2019-03-29: 40 meq via ORAL
  Filled 2019-03-29: qty 2

## 2019-03-29 MED ORDER — LIDOCAINE HCL (PF) 1 % IJ SOLN
INTRAMUSCULAR | Status: DC | PRN
  Administered 2019-03-29: 2 mL

## 2019-03-29 MED ORDER — SODIUM CHLORIDE 0.9% FLUSH
3.0000 mL | Freq: Two times a day (BID) | INTRAVENOUS | Status: DC
  Administered 2019-03-29 – 2019-04-02 (×8): 3 mL via INTRAVENOUS

## 2019-03-29 MED ORDER — SODIUM CHLORIDE 0.9 % IV SOLN
INTRAVENOUS | Status: DC
  Administered 2019-03-29: 06:00:00 via INTRAVENOUS

## 2019-03-29 MED ORDER — LIDOCAINE HCL (PF) 1 % IJ SOLN
INTRAMUSCULAR | Status: AC
  Filled 2019-03-29: qty 30

## 2019-03-29 MED ORDER — FENTANYL CITRATE (PF) 100 MCG/2ML IJ SOLN
INTRAMUSCULAR | Status: DC | PRN
  Administered 2019-03-29: 25 ug via INTRAVENOUS

## 2019-03-29 MED ORDER — HYDRALAZINE HCL 20 MG/ML IJ SOLN: 10.0000 mg | INTRAMUSCULAR | Status: AC | PRN

## 2019-03-29 MED ORDER — MIDAZOLAM HCL 2 MG/2ML IJ SOLN
INTRAMUSCULAR | Status: DC | PRN
  Administered 2019-03-29: 1 mg via INTRAVENOUS

## 2019-03-29 SURGICAL SUPPLY — 8 items
CATH BALLN WEDGE 5F 110CM (CATHETERS) ×1 IMPLANT
KIT HEART LEFT (KITS) ×2 IMPLANT
PACK CARDIAC CATHETERIZATION (CUSTOM PROCEDURE TRAY) ×2 IMPLANT
PROTECTION STATION PRESSURIZED (MISCELLANEOUS) ×2
SHEATH GLIDE SLENDER 4/5FR (SHEATH) ×1 IMPLANT
STATION PROTECTION PRESSURIZED (MISCELLANEOUS) IMPLANT
TRANSDUCER W/STOPCOCK (MISCELLANEOUS) ×2 IMPLANT
TUBING CIL FLEX 10 FLL-RA (TUBING) ×2 IMPLANT

## 2019-03-29 NOTE — Progress Notes (Signed)
RT placed patient on CPAP with foam pad on bridge of nose, underneath mask. Patient is tolerating CPAP well at this time. RT will monitor as needed.

## 2019-03-29 NOTE — Interval H&P Note (Signed)
History and Physical Interval Note:  03/29/2019 7:51 AM  [name]  has presented today for surgery, with the diagnosis of heart failure.  The various methods of treatment have been discussed with the patient and family. After consideration of risks, benefits and other options for treatment, the patient has consented to  Procedure(s): RIGHT HEART CATH (N/A) as a surgical intervention.  The patient's history has been reviewed, patient examined, no change in status, stable for surgery.  I have reviewed the patient's chart and labs.  Questions were answered to the patient's satisfaction.     [name]

## 2019-03-29 NOTE — Progress Notes (Signed)
Orthopedic Tech Progress Note Patient Details:  [name] 09/16/1956 [id number]  Ortho Devices Type of Ortho Device: Haematologist Ortho Device/Splint Location: Bilateral unna boots Ortho Device/Splint Interventions: Application   Post Interventions Patient Tolerated: Well Instructions Provided: Care of device   [name] 03/29/2019, 6:31 PM

## 2019-03-29 NOTE — Progress Notes (Signed)
Advanced Heart Failure Rounding Note   Subjective:    Milrinone started on 6/20. Remains on 0.125  Feels ok. Denies SOB, orthopnea or PND. Creatinine stable at 2.05. K 3.0   On IV amiodarone for atrial tach. Maintaining NSR.    Objective:   Weight Range:  Vital Signs:   Temp:  [97.5 F (36.4 C)-98 F (36.7 C)] 97.7 F (36.5 C) (06/22 0617) Pulse Rate:  [89-104] 89 (06/22 0617) Resp:  [16] 16 (06/22 0016) BP: (112-126)/(64-80) 112/64 (06/22 0617) SpO2:  [93 %-100 %] 94 % (06/22 0734) Weight:  [128.9 kg] 128.9 kg (06/22 0617) Last BM Date: 03/25/19  Weight change: Filed Weights   03/27/19 0518 03/28/19 0510 03/29/19 0617  Weight: 131.1 kg 131.5 kg 128.9 kg    Intake/Output:   Intake/Output Summary (Last 24 hours) at 03/29/2019 0747 Last data filed at 03/29/2019 0625 Gross per 24 hour  Intake 1440.79 ml  Output 601 ml  Net 839.79 ml     Physical Exam: General:  In bed. No resp difficulty HEENT: normal Neck: supple. JVP to jaw with prominent CV waves Carotids 2+ bilat; no bruits. No lymphadenopathy or thryomegaly appreciated. Cor: PMI nondisplaced. Regular rate & rhythm. 2/6 MR/TR Lungs: clear Abdomen: obesesoft, nontender, nondistended. No hepatosplenomegaly. No bruits or masses. Good bowel sounds. Extremities: no cyanosis, clubbing, rash, edema Neuro: alert & orientedx3, cranial nerves grossly intact. moves all 4 extremities w/o difficulty. Affect pleasant    Telemetry: Sinus 80-90s with LBBB Personally reviewed   Labs: Basic Metabolic Panel: Recent Labs  Lab 03/23/19 2127  03/25/19 0520 03/25/19 1408 03/26/19 0410 03/27/19 0407 03/28/19 1053 03/29/19 0532  NA 131*   < > 133* 134* 135 135 134* 132*  K 3.3*   < > 2.8* 3.2* 3.7 3.4* 3.6 3.0*  CL 88*   < > 89* 89* 92* 92* 90* 88*  CO2 29   < > 29 32 30 29 30 31   GLUCOSE 173*   < > 155* 178* 291* 222* 218* 172*  BUN 111*   < > 100* 100* 104* 104* 101* 96*  CREATININE 2.40*   < > 1.75* 1.85*  2.03* 2.07* 2.04* 2.05*  CALCIUM 9.7   < > 9.3 9.2 9.0 9.0 9.2 9.1  MG 2.2  --  2.1  --   --   --   --   --    < > = values in this interval not displayed.    Liver Function Tests: Recent Labs  Lab 03/22/19 1159 03/23/19 2127  AST 25 18  ALT 25 22  ALKPHOS 73 57  BILITOT 0.5 0.8  PROT 6.6 6.6  ALBUMIN 4.0 3.5   No results for input(s): LIPASE, AMYLASE in the last 168 hours. No results for input(s): AMMONIA in the last 168 hours.  CBC: Recent Labs  Lab 03/22/19 1159 03/23/19 2127 03/25/19 0520  WBC 9.4 5.6 7.5  HGB 12.0 11.6* 11.2*  HCT 37.2 36.3 34.7*  MCV 83 83.8 84.0  PLT 223 185 183    Cardiac Enzymes: No results for input(s): CKTOTAL, CKMB, CKMBINDEX, TROPONINI in the last 168 hours.  BNP: BNP (last 3 results) Recent Labs    01/22/19 0929 01/25/19 1603 02/16/19 1115  BNP 1,208.0* 971.0* 1,289.0*    ProBNP (last 3 results) No results for input(s): PROBNP in the last 8760 hours.    Other results:  Imaging: No results found.   Medications:     Scheduled Medications: . [MAR Hold] aspirin EC  81 mg Oral Daily  . [MAR Hold] clopidogrel  75 mg Oral Daily  . [MAR Hold] docusate sodium  100 mg Oral BID  . [MAR Hold] enoxaparin (LOVENOX) injection  40 mg Subcutaneous QHS  . [MAR Hold] ferrous sulfate  325 mg Oral BID WC  . [MAR Hold] furosemide  80 mg Intravenous BID  . [MAR Hold] gabapentin  1,200 mg Oral QHS  . [MAR Hold] gabapentin  600 mg Oral Daily  . [MAR Hold] insulin aspart  0-20 Units Subcutaneous TID WC  . [MAR Hold] insulin aspart  0-5 Units Subcutaneous QHS  . [MAR Hold] insulin glargine  14 Units Subcutaneous QHS  . [MAR Hold] metoprolol tartrate  5 mg Intravenous Once  . [MAR Hold] multivitamin with minerals  1 tablet Oral Daily  . [MAR Hold] rosuvastatin  40 mg Oral q1800  . [MAR Hold] sodium chloride flush  3 mL Intravenous Q12H  . sodium chloride flush  3 mL Intravenous Q12H    Infusions: . [MAR Hold] sodium chloride 10 mL/hr  at 03/25/19 1855  . sodium chloride    . sodium chloride 10 mL/hr at 03/29/19 0616  . amiodarone 30 mg/hr (03/29/19 0550)  . milrinone 0.125 mcg/kg/min (03/29/19 0550)    PRN Medications: [MAR Hold] sodium chloride, sodium chloride, [MAR Hold] acetaminophen, [MAR Hold] ondansetron (ZOFRAN) IV, [MAR Hold] sodium chloride flush, sodium chloride flush, [MAR Hold] traMADol   Assessment/Plan:   1. 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 Weight down. Renal function stable. Neck veins still up but this is confounded by severe TR - IV milrinone started on 6/20 to see if we can optimize her further prior to CRT-D reimplan this week  - Plan RHC this am  - No ACE/ARB with AKI. No b-blocker with ADHF - Can consider HDL/NTG as BP tolerates - follow BMET, I/O, daily wts - D/w Dr. Caryl Comes. CRT replacement this week   2. Acute on chronic CKD III - Creatinine 2.4 -> 2.0 -> 1.75 -> 1.85 -. 2.03 -> 2.07 -> 2.05 . - baseline creatinine 1.7-1.8  3. PAT & NSVT - recurrent - Suppressed with IV amio. Will continue. No need for [facility] for atrial tach.  D/w Dr Caryl Comes - Keep K> 4.0 Mg > 2.0   4. CAD - no ss/s ischemia   - last cath in 12/19 with severe LAD mid to distal LAD disease after previous stent. No PCI options - continue statin and DAPT  5. DM2 - Sugars are up  - Lantus adjusted yesterday and now improved - cover with SSI - HgBa1c 6.7  6. H/o PAD - s/p bilateral toe amputations due to non-healing wounds  7. Morbid obesity  8. Hypokalemia - k 3.0 will supp   Length of Stay: 6   Daniel Bensimhon MD 03/29/2019, 7:47 AM  Advanced Heart Failure Team Pager [phone] (M-F; [place])  Please contact [facility] for night-coverage after hours (4p -7a ) and weekends on [email or web address]

## 2019-03-29 NOTE — H&P (View-Only) (Signed)
Advanced Heart Failure Rounding Note   Subjective:    Milrinone started on 6/20. Remains on 0.125  Feels ok. Denies SOB, orthopnea or PND. Creatinine stable at 2.05. K 3.0   On IV amiodarone for atrial tach. Maintaining NSR.    Objective:   Weight Range:  Vital Signs:   Temp:  [97.5 F (36.4 C)-98 F (36.7 C)] 97.7 F (36.5 C) (06/22 0617) Pulse Rate:  [89-104] 89 (06/22 0617) Resp:  [16] 16 (06/22 0016) BP: (112-126)/(64-80) 112/64 (06/22 0617) SpO2:  [93 %-100 %] 94 % (06/22 0734) Weight:  [128.9 kg] 128.9 kg (06/22 0617) Last BM Date: 03/25/19  Weight change: Filed Weights   03/27/19 0518 03/28/19 0510 03/29/19 0617  Weight: 131.1 kg 131.5 kg 128.9 kg    Intake/Output:   Intake/Output Summary (Last 24 hours) at 03/29/2019 0747 Last data filed at 03/29/2019 0625 Gross per 24 hour  Intake 1440.79 ml  Output 601 ml  Net 839.79 ml     Physical Exam: General:  In bed. No resp difficulty HEENT: normal Neck: supple. JVP to jaw with prominent CV waves Carotids 2+ bilat; no bruits. No lymphadenopathy or thryomegaly appreciated. Cor: PMI nondisplaced. Regular rate & rhythm. 2/6 MR/TR Lungs: clear Abdomen: obesesoft, nontender, nondistended. No hepatosplenomegaly. No bruits or masses. Good bowel sounds. Extremities: no cyanosis, clubbing, rash, edema Neuro: alert & orientedx3, cranial nerves grossly intact. moves all 4 extremities w/o difficulty. Affect pleasant    Telemetry: Sinus 80-90s with LBBB Personally reviewed   Labs: Basic Metabolic Panel: Recent Labs  Lab 03/23/19 2127  03/25/19 0520 03/25/19 1408 03/26/19 0410 03/27/19 0407 03/28/19 1053 03/29/19 0532  NA 131*   < > 133* 134* 135 135 134* 132*  K 3.3*   < > 2.8* 3.2* 3.7 3.4* 3.6 3.0*  CL 88*   < > 89* 89* 92* 92* 90* 88*  CO2 29   < > 29 32 30 29 30 31   GLUCOSE 173*   < > 155* 178* 291* 222* 218* 172*  BUN 111*   < > 100* 100* 104* 104* 101* 96*  CREATININE 2.40*   < > 1.75* 1.85*  2.03* 2.07* 2.04* 2.05*  CALCIUM 9.7   < > 9.3 9.2 9.0 9.0 9.2 9.1  MG 2.2  --  2.1  --   --   --   --   --    < > = values in this interval not displayed.    Liver Function Tests: Recent Labs  Lab 03/22/19 1159 03/23/19 2127  AST 25 18  ALT 25 22  ALKPHOS 73 57  BILITOT 0.5 0.8  PROT 6.6 6.6  ALBUMIN 4.0 3.5   No results for input(s): LIPASE, AMYLASE in the last 168 hours. No results for input(s): AMMONIA in the last 168 hours.  CBC: Recent Labs  Lab 03/22/19 1159 03/23/19 2127 03/25/19 0520  WBC 9.4 5.6 7.5  HGB 12.0 11.6* 11.2*  HCT 37.2 36.3 34.7*  MCV 83 83.8 84.0  PLT 223 185 183    Cardiac Enzymes: No results for input(s): CKTOTAL, CKMB, CKMBINDEX, TROPONINI in the last 168 hours.  BNP: BNP (last 3 results) Recent Labs    01/22/19 0929 01/25/19 1603 02/16/19 1115  BNP 1,208.0* 971.0* 1,289.0*    ProBNP (last 3 results) No results for input(s): PROBNP in the last 8760 hours.    Other results:  Imaging: No results found.   Medications:     Scheduled Medications: . [MAR Hold] aspirin EC  81 mg Oral Daily  . [MAR Hold] clopidogrel  75 mg Oral Daily  . [MAR Hold] docusate sodium  100 mg Oral BID  . [MAR Hold] enoxaparin (LOVENOX) injection  40 mg Subcutaneous QHS  . [MAR Hold] ferrous sulfate  325 mg Oral BID WC  . [MAR Hold] furosemide  80 mg Intravenous BID  . [MAR Hold] gabapentin  1,200 mg Oral QHS  . [MAR Hold] gabapentin  600 mg Oral Daily  . [MAR Hold] insulin aspart  0-20 Units Subcutaneous TID WC  . [MAR Hold] insulin aspart  0-5 Units Subcutaneous QHS  . [MAR Hold] insulin glargine  14 Units Subcutaneous QHS  . [MAR Hold] metoprolol tartrate  5 mg Intravenous Once  . [MAR Hold] multivitamin with minerals  1 tablet Oral Daily  . [MAR Hold] rosuvastatin  40 mg Oral q1800  . [MAR Hold] sodium chloride flush  3 mL Intravenous Q12H  . sodium chloride flush  3 mL Intravenous Q12H    Infusions: . [MAR Hold] sodium chloride 10 mL/hr  at 03/25/19 1855  . sodium chloride    . sodium chloride 10 mL/hr at 03/29/19 0616  . amiodarone 30 mg/hr (03/29/19 0550)  . milrinone 0.125 mcg/kg/min (03/29/19 0550)    PRN Medications: [MAR Hold] sodium chloride, sodium chloride, [MAR Hold] acetaminophen, [MAR Hold] ondansetron (ZOFRAN) IV, [MAR Hold] sodium chloride flush, sodium chloride flush, [MAR Hold] traMADol   Assessment/Plan:   1. 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 Weight down. Renal function stable. Neck veins still up but this is confounded by severe TR - IV milrinone started on 6/20 to see if we can optimize her further prior to CRT-D reimplan this week  - Plan RHC this am  - No ACE/ARB with AKI. No b-blocker with ADHF - Can consider HDL/NTG as BP tolerates - follow BMET, I/O, daily wts - D/w Dr. Caryl Comes. CRT replacement this week   2. Acute on chronic CKD III - Creatinine 2.4 -> 2.0 -> 1.75 -> 1.85 -. 2.03 -> 2.07 -> 2.05 . - baseline creatinine 1.7-1.8  3. PAT & NSVT - recurrent - Suppressed with IV amio. Will continue. No need for [facility] for atrial tach.  D/w Dr Caryl Comes - Keep K> 4.0 Mg > 2.0   4. CAD - no ss/s ischemia   - last cath in 12/19 with severe LAD mid to distal LAD disease after previous stent. No PCI options - continue statin and DAPT  5. DM2 - Sugars are up  - Lantus adjusted yesterday and now improved - cover with SSI - HgBa1c 6.7  6. H/o PAD - s/p bilateral toe amputations due to non-healing wounds  7. Morbid obesity  8. Hypokalemia - k 3.0 will supp   Length of Stay: 6   Luie Laneve MD 03/29/2019, 7:47 AM  Advanced Heart Failure Team Pager [phone] (M-F; [place])  Please contact [facility] for night-coverage after hours (4p -7a ) and weekends on [email or web address]

## 2019-03-30 ENCOUNTER — Ambulatory Visit: Payer: PPO | Admitting: Internal Medicine

## 2019-03-30 LAB — BASIC METABOLIC PANEL
Anion gap: 16 — ABNORMAL HIGH (ref 5–15)
BUN: 97 mg/dL — ABNORMAL HIGH (ref 8–23)
CO2: 28 mmol/L (ref 22–32)
Calcium: 9.1 mg/dL (ref 8.9–10.3)
Chloride: 88 mmol/L — ABNORMAL LOW (ref 98–111)
Creatinine, Ser: 2.11 mg/dL — ABNORMAL HIGH (ref 0.44–1.00)
GFR calc Af Amer: 28 mL/min — ABNORMAL LOW (ref >60)
GFR calc non Af Amer: 24 mL/min — ABNORMAL LOW (ref >60)
Glucose, Bld: 189 mg/dL — ABNORMAL HIGH (ref 70–99)
Potassium: 3.5 mmol/L (ref 3.5–5.1)
Sodium: 132 mmol/L — ABNORMAL LOW (ref 135–145)

## 2019-03-30 LAB — CBC
HCT: 34.2 % — ABNORMAL LOW (ref 36.0–46.0)
Hemoglobin: 11.1 g/dL — ABNORMAL LOW (ref 12.0–15.0)
MCH: 27.1 pg (ref 26.0–34.0)
MCHC: 32.5 g/dL (ref 30.0–36.0)
MCV: 83.6 fL (ref 80.0–100.0)
Platelets: 143 10*3/uL — ABNORMAL LOW (ref 150–400)
RBC: 4.09 MIL/uL (ref 3.87–5.11)
RDW: 15.2 % (ref 11.5–15.5)
WBC: 5 10*3/uL (ref 4.0–10.5)
nRBC: 0 % (ref 0.0–0.2)

## 2019-03-30 LAB — SURGICAL PCR SCREEN
MRSA, PCR: NEGATIVE
Staphylococcus aureus: POSITIVE — AB

## 2019-03-30 LAB — GLUCOSE, CAPILLARY
Glucose-Capillary: 187 mg/dL — ABNORMAL HIGH (ref 70–99)
Glucose-Capillary: 204 mg/dL — ABNORMAL HIGH (ref 70–99)
Glucose-Capillary: 165 mg/dL — ABNORMAL HIGH (ref 70–99)
Glucose-Capillary: 168 mg/dL — ABNORMAL HIGH (ref 70–99)

## 2019-03-30 LAB — MAGNESIUM: Magnesium: 2.2 mg/dL (ref 1.7–2.4)

## 2019-03-30 MED ORDER — DEXTROSE 5 % IV SOLN
3.0000 g | INTRAVENOUS | Status: AC
  Administered 2019-03-31: 14:00:00 3 g via INTRAVENOUS
  Filled 2019-03-30 (×3): qty 3000

## 2019-03-30 MED ORDER — POTASSIUM CHLORIDE CRYS ER 20 MEQ PO TBCR
40.0000 meq | EXTENDED_RELEASE_TABLET | Freq: Two times a day (BID) | ORAL | Status: AC
  Administered 2019-03-30 (×2): 40 meq via ORAL
  Filled 2019-03-30 (×2): qty 2

## 2019-03-30 MED ORDER — AMIODARONE HCL IN DEXTROSE 360-4.14 MG/200ML-% IV SOLN
INTRAVENOUS | Status: AC
  Administered 2019-03-30: 17:00:00
  Filled 2019-03-30: qty 200

## 2019-03-30 MED ORDER — SODIUM CHLORIDE 0.9 % IV SOLN
INTRAVENOUS | Status: DC
  Administered 2019-03-31: 09:00:00 via INTRAVENOUS

## 2019-03-30 MED ORDER — CHLORHEXIDINE GLUCONATE 4 % EX LIQD
60.0000 mL | Freq: Once | CUTANEOUS | Status: AC
  Administered 2019-03-30: 4 via TOPICAL
  Filled 2019-03-30: qty 60

## 2019-03-30 MED ORDER — ALPRAZOLAM 0.25 MG PO TABS
0.2500 mg | ORAL_TABLET | Freq: Once | ORAL | Status: AC
  Administered 2019-03-31: 0.25 mg via ORAL
  Filled 2019-03-30: qty 1

## 2019-03-30 MED ORDER — SODIUM CHLORIDE 0.9 % IV SOLN
80.0000 mg | INTRAVENOUS | Status: DC
  Filled 2019-03-30: qty 2

## 2019-03-30 NOTE — Plan of Care (Signed)

## 2019-03-30 NOTE — Progress Notes (Signed)
RT placed patient on CPAP with 2L O2 bled into circuit. Patient tolerating well at this time. No respiratory distress noted. RT will monitor as needed.

## 2019-03-30 NOTE — Progress Notes (Signed)
   03/30/19 1100  Clinical Encounter Type  Visited With Patient;Health care provider  Visit Type Initial;Psychological support;Spiritual support  Referral From Nurse  Spiritual Encounters  Spiritual Needs Prayer;Emotional;Grief support  Stress Factors  Patient Stress Factors Exhausted;Family relationships;Health changes;Loss of control   Pt's husband is also a patient here and had CPR overnight.  Pt was on video call w/ her husband's care team when I entered.  This pt's RN also present in this pt's rm.  Pt tearful and upset in light of situation.  Empathetic listening.  Situation further complicated by complex family dynamics with pts' adult children and in-laws.  This pt's mother is living and this pt previously stayed w/ her part of the week, experiencing guilt over not being able to do so d/t health.  This pt and her husband who is in ICU have been together for close to 50 years.    Prayed w/ pt for her and for her husband.  Inquired about calling to provide chaplain support to pt's daughter and pt did not think that was needed at this time.  Will f/u w/ pt.  Also, asked Carlyle Lipa, chaplain resident for 45M to visit pt's husband.  Myra Gianotti resident, [phone]

## 2019-03-30 NOTE — Care Management Important Message (Signed)
Important Message  Patient Details  Name: [name] MRN: [id number] Date of Birth: 12/15/1955   Medicare Important Message Given:  Yes     Shelda Altes 03/30/2019, 11:38 AM

## 2019-03-30 NOTE — Progress Notes (Signed)
Advanced Heart Failure Rounding Note   Subjective:    Milrinone started on 6/20. Remains on 0.125. Underwent RHC on 6/22 with persistent volume overload.   RA = 16 RV = 57/20 PA = 57/28 (40) PCW = 27 v = 40 Fick cardiac output/index = 5.37/2.24 PVR = 2.5 WU Ao sat = 95% PA sat = 54%, 57%  Lasix increased to 80 IV bid. Weight down 6 pounds. Breathing better. No orthopnea or PND. Very tearful over husband in hospital s/p Code Blue  Creatinine up slightly to 2.11  On IV amiodarone for atrial tach. Maintaining NSR.    Objective:   Weight Range:  Vital Signs:   Temp:  [97.7 F (36.5 C)] 97.7 F (36.5 C) (06/23 0531) Pulse Rate:  [93-95] 94 (06/23 0531) Resp:  [16-18] 16 (06/23 0014) BP: (107-136)/(63-82) 136/82 (06/23 0531) SpO2:  [94 %-96 %] 96 % (06/23 0531) Last BM Date: 03/25/19  Weight change: Filed Weights   03/27/19 0518 03/28/19 0510 03/29/19 0617  Weight: 131.1 kg 131.5 kg 128.9 kg    Intake/Output:   Intake/Output Summary (Last 24 hours) at 03/30/2019 1053 Last data filed at 03/30/2019 0829 Gross per 24 hour  Intake 1489.74 ml  Output 550 ml  Net 939.74 ml     Physical Exam: General:  In chair. No resp difficulty HEENT: normal Neck: supple. no JVD 9 + prominent CV waves . Carotids 2+ bilat; no bruits. No lymphadenopathy or thryomegaly appreciated. Cor: PMI nondisplaced. Regular rate & rhythm. 2/6 MR/TR Lungs: clear Abdomen: obese soft, nontender, nondistended. No hepatosplenomegaly. No bruits or masses. Good bowel sounds. Extremities: no cyanosis, clubbing, rash, tr edema + UNNA boots Neuro: alert & orientedx3, cranial nerves grossly intact. moves all 4 extremities w/o difficulty. Affect pleasant   Telemetry: Sinus 90s with LBBB Personally reviewed   Labs: Basic Metabolic Panel: Recent Labs  Lab 03/23/19 2127  03/25/19 0520  03/26/19 0410 03/27/19 0407 03/28/19 1053 03/29/19 0532 03/29/19 0817 03/29/19 0821 03/30/19 0557  NA 131*    < > 133*   < > 135 135 134* 132* 134* 137 132*  K 3.3*   < > 2.8*   < > 3.7 3.4* 3.6 3.0* 3.1* 2.9* 3.5  CL 88*   < > 89*   < > 92* 92* 90* 88*  --   --  88*  CO2 29   < > 29   < > 30 29 30 31   --   --  28  GLUCOSE 173*   < > 155*   < > 291* 222* 218* 172*  --   --  189*  BUN 111*   < > 100*   < > 104* 104* 101* 96*  --   --  97*  CREATININE 2.40*   < > 1.75*   < > 2.03* 2.07* 2.04* 2.05*  --   --  2.11*  CALCIUM 9.7   < > 9.3   < > 9.0 9.0 9.2 9.1  --   --  9.1  MG 2.2  --  2.1  --   --   --   --   --   --   --  2.2   < > = values in this interval not displayed.    Liver Function Tests: Recent Labs  Lab 03/23/19 2127  AST 18  ALT 22  ALKPHOS 57  BILITOT 0.8  PROT 6.6  ALBUMIN 3.5   No results for input(s): LIPASE, AMYLASE in the  last 168 hours. No results for input(s): AMMONIA in the last 168 hours.  CBC: Recent Labs  Lab 03/23/19 2127 03/25/19 0520 03/29/19 0817 03/29/19 0821 03/30/19 0557  WBC 5.6 7.5  --   --  5.0  HGB 11.6* 11.2* 11.2* 10.5* 11.1*  HCT 36.3 34.7* 33.0* 31.0* 34.2*  MCV 83.8 84.0  --   --  83.6  PLT 185 183  --   --  143*    Cardiac Enzymes: No results for input(s): CKTOTAL, CKMB, CKMBINDEX, TROPONINI in the last 168 hours.  BNP: BNP (last 3 results) Recent Labs    01/22/19 0929 01/25/19 1603 02/16/19 1115  BNP 1,208.0* 971.0* 1,289.0*    ProBNP (last 3 results) No results for input(s): PROBNP in the last 8760 hours.    Other results:  Imaging: No results found.   Medications:     Scheduled Medications: . aspirin EC  81 mg Oral Daily  . clopidogrel  75 mg Oral Daily  . docusate sodium  100 mg Oral BID  . enoxaparin (LOVENOX) injection  40 mg Subcutaneous QHS  . ferrous sulfate  325 mg Oral BID WC  . furosemide  80 mg Intravenous BID  . gabapentin  1,200 mg Oral QHS  . gabapentin  600 mg Oral Daily  . insulin aspart  0-20 Units Subcutaneous TID WC  . insulin aspart  0-5 Units Subcutaneous QHS  . insulin glargine  14  Units Subcutaneous QHS  . multivitamin with minerals  1 tablet Oral Daily  . potassium chloride  40 mEq Oral BID  . rosuvastatin  40 mg Oral q1800  . sodium chloride flush  3 mL Intravenous Q12H  . sodium chloride flush  3 mL Intravenous Q12H    Infusions: . sodium chloride 10 mL/hr at 03/25/19 1855  . sodium chloride    . amiodarone 30 mg/hr (03/30/19 0829)  . milrinone 0.125 mcg/kg/min (03/30/19 0829)    PRN Medications: sodium chloride, sodium chloride, acetaminophen, ondansetron (ZOFRAN) IV, sodium chloride flush, sodium chloride flush, traMADol   Assessment/Plan:   1. 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 IV lasix restarted - Diuresed well overnight. Still seems overloaded. Continue IV diuresis. Watch renal function closely  - Continue milrinone to optimize her further prior to CRT-D scheduled for tomorrow - No ACE/ARB with AKI. No b-blocker with ADHF - Can consider HDL/NTG as BP tolerates - follow BMET, I/O, daily wts - D/w Dr. Caryl Comes. CRT replacement tomorrow   2. Acute on chronic CKD III - Creatinine 2.4 -> 2.0 -> 1.75 -> 1.85 -. 2.03 -> 2.07 -> 2.05 -> 2.11 - baseline creatinine 1.7-1.8  3. PAT & NSVT - recurrent - Suppressed with IV amio. Will continue. No need for [facility] for atrial tach.  D/w Dr Caryl Comes - Keep K> 4.0 Mg > 2.0 K 3.5 today. Will supp   4. CAD - no ss/s ischemia   - last cath in 12/19 with severe LAD mid to distal LAD disease after previous stent. No PCI options - continue statin and DAPT  5. DM2 - Sugars are up  - Lantus adjusted yesterday and now improved - cover with SSI - HgBa1c 6.7  6. H/o PAD - s/p bilateral toe amputations due to non-healing wounds  7. Morbid obesity  8. Hypokalemia - k 3.5 will supp   Length of Stay: 7  Glori Bickers MD 03/30/2019, 10:53 AM  Advanced Heart Failure Team  Pager [phone] (M-F; 7a - 4p)  Please contact [facility] for night-coverage after hours (4p -7a ) and weekends on [email or web address]

## 2019-03-31 ENCOUNTER — Encounter (HOSPITAL_COMMUNITY)
Admission: AD | Disposition: A | Discharge status: Home Health | Payer: Self-pay | Source: Ambulatory Visit | Attending: Internal Medicine

## 2019-03-31 DIAGNOSIS — I255 Ischemic cardiomyopathy: Secondary | ICD-10-CM

## 2019-03-31 DIAGNOSIS — I5022 Chronic systolic (congestive) heart failure: Secondary | ICD-10-CM

## 2019-03-31 DIAGNOSIS — I428 Other cardiomyopathies: Secondary | ICD-10-CM

## 2019-03-31 HISTORY — PX: BIV ICD INSERTION CRT-D: EP1195

## 2019-03-31 LAB — BASIC METABOLIC PANEL
Anion gap: 17 — ABNORMAL HIGH (ref 5–15)
BUN: 100 mg/dL — ABNORMAL HIGH (ref 8–23)
CO2: 28 mmol/L (ref 22–32)
Calcium: 9.1 mg/dL (ref 8.9–10.3)
Chloride: 87 mmol/L — ABNORMAL LOW (ref 98–111)
Creatinine, Ser: 2.6 mg/dL — ABNORMAL HIGH (ref 0.44–1.00)
GFR calc Af Amer: 22 mL/min — ABNORMAL LOW (ref >60)
GFR calc non Af Amer: 19 mL/min — ABNORMAL LOW (ref >60)
Glucose, Bld: 182 mg/dL — ABNORMAL HIGH (ref 70–99)
Potassium: 3.8 mmol/L (ref 3.5–5.1)
Sodium: 132 mmol/L — ABNORMAL LOW (ref 135–145)

## 2019-03-31 LAB — GLUCOSE, CAPILLARY
Glucose-Capillary: 175 mg/dL — ABNORMAL HIGH (ref 70–99)
Glucose-Capillary: 165 mg/dL — ABNORMAL HIGH (ref 70–99)
Glucose-Capillary: 169 mg/dL — ABNORMAL HIGH (ref 70–99)
Glucose-Capillary: 243 mg/dL — ABNORMAL HIGH (ref 70–99)

## 2019-03-31 SURGERY — BIV ICD INSERTION CRT-D

## 2019-03-31 MED ORDER — HEPARIN (PORCINE) IN NACL 1000-0.9 UT/500ML-% IV SOLN
INTRAVENOUS | Status: DC | PRN
  Administered 2019-03-31: 500 mL

## 2019-03-31 MED ORDER — FENTANYL CITRATE (PF) 100 MCG/2ML IJ SOLN
INTRAMUSCULAR | Status: AC
  Filled 2019-03-31: qty 2

## 2019-03-31 MED ORDER — MIDAZOLAM HCL 5 MG/5ML IJ SOLN
INTRAMUSCULAR | Status: DC | PRN
  Administered 2019-03-31 (×3): 1 mg via INTRAVENOUS

## 2019-03-31 MED ORDER — ONDANSETRON HCL 4 MG/2ML IJ SOLN: 4.0000 mg | Freq: Four times a day (QID) | INTRAMUSCULAR | Status: DC | PRN

## 2019-03-31 MED ORDER — SODIUM CHLORIDE 0.9 % IV SOLN
INTRAVENOUS | Status: AC
  Filled 2019-03-31: qty 2

## 2019-03-31 MED ORDER — IOHEXOL 350 MG/ML SOLN
INTRAVENOUS | Status: DC | PRN
  Administered 2019-03-31: 17:00:00 22 mL via INTRAVENOUS

## 2019-03-31 MED ORDER — CHLORHEXIDINE GLUCONATE CLOTH 2 % EX PADS
6.0000 | MEDICATED_PAD | Freq: Every day | CUTANEOUS | Status: DC
  Administered 2019-03-31 – 2019-04-02 (×2): 6 via TOPICAL

## 2019-03-31 MED ORDER — MIDAZOLAM HCL 5 MG/5ML IJ SOLN
INTRAMUSCULAR | Status: AC
  Filled 2019-03-31: qty 5

## 2019-03-31 MED ORDER — SODIUM CHLORIDE 0.9 % IV SOLN
INTRAVENOUS | Status: DC | PRN
  Administered 2019-03-31: 500 mL

## 2019-03-31 MED ORDER — ACETAMINOPHEN 325 MG PO TABS: 325.0000 mg | ORAL_TABLET | ORAL | Status: DC | PRN

## 2019-03-31 MED ORDER — FENTANYL CITRATE (PF) 100 MCG/2ML IJ SOLN
INTRAMUSCULAR | Status: DC | PRN
  Administered 2019-03-31 (×3): 25 ug via INTRAVENOUS

## 2019-03-31 MED ORDER — LIDOCAINE HCL (PF) 1 % IJ SOLN
INTRAMUSCULAR | Status: AC
  Filled 2019-03-31: qty 90

## 2019-03-31 MED ORDER — CEFAZOLIN SODIUM-DEXTROSE 1-4 GM/50ML-% IV SOLN
1.0000 g | Freq: Four times a day (QID) | INTRAVENOUS | Status: AC
  Administered 2019-03-31 – 2019-04-01 (×3): 1 g via INTRAVENOUS
  Filled 2019-03-31 (×3): qty 50

## 2019-03-31 MED ORDER — SODIUM CHLORIDE 0.9 % IV SOLN
INTRAVENOUS | Status: AC | PRN
  Administered 2019-03-31: 10 mL/h via INTRAVENOUS

## 2019-03-31 MED ORDER — HEPARIN (PORCINE) IN NACL 1000-0.9 UT/500ML-% IV SOLN
INTRAVENOUS | Status: AC
  Filled 2019-03-31: qty 500

## 2019-03-31 MED ORDER — MUPIROCIN 2 % EX OINT
1.0000 "application " | TOPICAL_OINTMENT | Freq: Two times a day (BID) | CUTANEOUS | Status: DC
  Administered 2019-03-31 – 2019-04-02 (×4): 1 via NASAL
  Filled 2019-03-31: qty 22

## 2019-03-31 MED ORDER — LIDOCAINE HCL (PF) 1 % IJ SOLN
INTRAMUSCULAR | Status: DC | PRN
  Administered 2019-03-31: 50 mL

## 2019-03-31 SURGICAL SUPPLY — 21 items
BALLN ATTAIN 80 (BALLOONS) ×2
BALLN ATTAIN 80CM 6215 (BALLOONS) ×1
BALLOON ATTAIN 80 (BALLOONS) IMPLANT
CABLE SURGICAL S-101-97-12 (CABLE) ×3 IMPLANT
CATH ATTAIN COM SURV 6250V-MB2 (CATHETERS) ×2 IMPLANT
GUIDEWIRE ANGLED .035X150CM (WIRE) ×4 IMPLANT
HOVERMATT SINGLE USE (MISCELLANEOUS) ×2 IMPLANT
ICD CLARIA MRI DTMA1QQ (ICD Generator) ×2 IMPLANT
LEAD ATTAIN PERFORM ST 4398-88 (Lead) ×2 IMPLANT
LEAD CAPSURE NOVUS 5076-52CM (Lead) ×2 IMPLANT
LEAD SPRINT QUAT SEC 6935M-62 (Lead) ×2 IMPLANT
PAD PRO RADIOLUCENT 2001M-C (PAD) ×3 IMPLANT
SHEATH CLASSIC 7F (SHEATH) ×2 IMPLANT
SHEATH CLASSIC 7F 25CM (SHEATH) ×2 IMPLANT
SHEATH CLASSIC 9.5F (SHEATH) ×4 IMPLANT
SHEATH CLASSIC 9F (SHEATH) ×2 IMPLANT
SLITTER 6232ADJ (MISCELLANEOUS) ×2 IMPLANT
TRAY PACEMAKER INSERTION (PACKS) ×3 IMPLANT
WIRE ACUITY WHISPER EDS 4648 (WIRE) ×4 IMPLANT
WIRE AMPLATZ ST .035X145CM (WIRE) ×2 IMPLANT
WIRE HI TORQ VERSACORE-J 145CM (WIRE) ×2 IMPLANT

## 2019-03-31 NOTE — Progress Notes (Signed)
On call cardiologist notified of pt's swab being positive for staph. positive staph order set placed. Hoover Brunette, RN

## 2019-03-31 NOTE — Consult Note (Addendum)
ELECTROPHYSIOLOGY CONSULT NOTE    Patient ID: [name] MRN: [id number], DOB/AGE: August 27, 1956 63 y.o.  Admit date: 03/23/2019 Date of Consult: 03/31/2019  Primary Physician: Myles Lipps, MD Primary Cardiologist: Dr. Gala Romney Electrophysiologist: Dr. Graciela Husbands  Patient Profile: [name] is a 63 y.o. female with a history of Mixed cardiomyopathy, h/o CRT-D s/p explant at [facility] 12/2018 for MSSA bacteremia, Chronic systolic CHF, LBBB, and CKD  who is being seen today for the evaluation of CRT-D implant at the request of Dr. Gala Romney.  HPI:  [name] is a 63 y.o. female with h/o heart failure and near normalization of her EF in the setting of CRT-D. Unfortunately, she developed MSSA bacteremia x 2 and required removal of her device at [facility] along with aspiration of tricuspid valve/V thrombus.  Her EF again declined from 45-50% -> 10-15% without her CRT-D.   Hospitalized 03/2019 for urosepsis. BCx negative, UCs + for E. Coli. Course complicated by ATN.  Dr. Gala Romney actively follows for her HF management.  Admitted for optimization prior to ICD re-implant. Started on milrinone 6/20. (No PICC line). RHC 6/22 showed persistent volume overload. Has been diuresing on IV lasix.  Creatinine up today. 2.1 -> 2.6.  Past Medical History:  Diagnosis Date  . Acute myocardial infarction, subendocardial infarction, initial episode of care (HCC) 07/21/2012  . Acute osteomyelitis involving ankle and foot (HCC) 02/06/2015  . Acute systolic heart failure (HCC) 07/21/2012   New onset 07/19/12; admission to [facility] ED. Elevated Troponins.  S/p 2D-echo with EF 20-25%.  S/p cardiac catheterization with stenting LAD.  Repeat 2D-echo 10/2011 with improved EF of 35%.   . Anemia   . Automatic implantable cardioverter-defibrillator in situ    a. MDT CRT-D 06/2014, SN: [id number]  .removed in feb 2020  . CAD (coronary artery disease)    a. cardiac cath 101/04/2012: PCI/DES to chronically occluded  mLAD, consideration PCI to diag branch in 4 weeks.   . Cataract   . Chicken pox   . Chronic kidney disease   . Chronic systolic CHF (congestive heart failure) (HCC)    a. mixed ICM & NICM; b. EF 20-25% by echo 07/2012, mid-dist 2/3 of LV sev HK/AK, mild MR. echo 10/2012: EF 30-35%, sev HK ant-septal & inf walls, GR1DD, mild MR, PASP 33. c. echo 02/2013: EF 30%, GR1DD, mild MR. echo 04/2014: EF 30%, Septal-lat dyssynchrony, global HK, inf AK, GR1DD, mild MR. d. echo 10/2014: EF50-55%, WM nl, GR1DD, septal mild paradox. e. echo 02/2015: EF 50-55%, wm   . Depression   . Heart attack (HCC)   . Heel ulcer (HCC) 04/27/2015  . History of blood transfusion ~ 2011   "plasma; had neuropathy; couldn't walk"  . Hypertension   . LBBB (left bundle branch block)   . Neuromuscular disorder (HCC)   . Neuropathy 2011  . Obesity, unspecified   . OSA on CPAP    Moderate with AHI 23/hr and now on CPAP at 16cm H2O.  Her DME is [facility]  . Pure hypercholesterolemia   . Sepsis (HCC) 09/2018  . Type II diabetes mellitus (HCC)   . Unspecified vitamin D deficiency      Surgical History:  Past Surgical History:  Procedure Laterality Date  . AMPUTATION TOE Right 06/18/2015   Procedure: AMPUTATION TOE;  Surgeon: Gwyneth Revels, DPM;  Location: [facility] ORS;  Service: Podiatry;  Laterality: Right;  . AMPUTATION TOE Left 10/08/2018   Procedure: AMPUTATION TOE LEFT 2ND;  Surgeon: Recardo Evangelist,  DPM;  Location: [facility] ORS;  Service: Podiatry;  Laterality: Left;  . BACK SURGERY    . BI-VENTRICULAR IMPLANTABLE CARDIOVERTER DEFIBRILLATOR N/A 07/06/2014   Procedure: BI-VENTRICULAR IMPLANTABLE CARDIOVERTER DEFIBRILLATOR  (CRT-D);  Surgeon: Duke Salvia, MD;  Location: [facility] CATH LAB;  Service: Cardiovascular;  Laterality: N/A;  . BI-VENTRICULAR IMPLANTABLE CARDIOVERTER DEFIBRILLATOR  (CRT-D)  07/06/2014  . BILATERAL OOPHORECTOMY  01/2011   ovarian cyst benign  . COLONOSCOPY WITH PROPOFOL Left 02/22/2015   Procedure: COLONOSCOPY WITH  PROPOFOL;  Surgeon: Wallace Cullens, MD;  Location: [facility] ENDOSCOPY;  Service: Endoscopy;  Laterality: Left;  . CORONARY ANGIOPLASTY WITH STENT PLACEMENT Left 07/2012   new onset systolic CHF; elevated troponins.  Cardiac catheterization with stenting to LAD; EF 15%.  2D-echo: EF 20-25%.  . ESOPHAGOGASTRODUODENOSCOPY N/A 02/22/2015   Procedure: ESOPHAGOGASTRODUODENOSCOPY (EGD);  Surgeon: Wallace Cullens, MD;  Location: [facility] ENDOSCOPY;  Service: Endoscopy;  Laterality: N/A;  . INCISION AND DRAINAGE ABSCESS Right 2007   groin; with ICU stay due to sepsis.  Marland Kitchen LAPAROSCOPIC CHOLECYSTECTOMY  2011  . LEFT HEART CATH AND CORONARY ANGIOGRAPHY N/A 10/05/2018   Procedure: LEFT HEART CATH AND CORONARY ANGIOGRAPHY;  Surgeon: Antonieta Iba, MD;  Location: [facility] INVASIVE CV LAB;  Service: Cardiovascular;  Laterality: N/A;  . LEFT HEART CATHETERIZATION WITH CORONARY ANGIOGRAM N/A 07/21/2012   Procedure: LEFT HEART CATHETERIZATION WITH CORONARY ANGIOGRAM;  Surgeon: Dolores Patty, MD;  Location: [facility] CATH LAB;  Service: Cardiovascular;  Laterality: N/A;  . LUMBAR DISC SURGERY  1996   L4-5  . PACEMAKER REMOVAL  11/2018   due to infection around pacemaker  . PERCUTANEOUS CORONARY STENT INTERVENTION (PCI-S) N/A 07/23/2012   Procedure: PERCUTANEOUS CORONARY STENT INTERVENTION (PCI-S);  Surgeon: Tonny Bollman, MD;  Location: [facility] CATH LAB;  Service: Cardiovascular;  Laterality: N/A;  . PERIPHERAL VASCULAR BALLOON ANGIOPLASTY Left 10/06/2018   Procedure: PERIPHERAL VASCULAR BALLOON ANGIOPLASTY;  Surgeon: Renford Dills, MD;  Location: [facility] INVASIVE CV LAB;  Service: Cardiovascular;  Laterality: Left;  . PERIPHERAL VASCULAR CATHETERIZATION N/A 02/10/2015   Procedure: Picc Line Insertion;  Surgeon: Renford Dills, MD;  Location: [facility] INVASIVE CV LAB;  Service: Cardiovascular;  Laterality: N/A;  . RIGHT HEART CATH N/A 03/29/2019   Procedure: RIGHT HEART CATH;  Surgeon: Dolores Patty, MD;  Location: [facility] INVASIVE CV LAB;   Service: Cardiovascular;  Laterality: N/A;  . TEE WITHOUT CARDIOVERSION N/A 10/02/2018   Procedure: TRANSESOPHAGEAL ECHOCARDIOGRAM (TEE);  Surgeon: Iran Ouch, MD;  Location: [facility] ORS;  Service: Cardiovascular;  Laterality: N/A;  . TUBAL LIGATION  1981  . VAGINAL HYSTERECTOMY  01/2011   Fibroids/DUB.  Ovaries removed. Fontaine.     Medications Prior to Admission  Medication Sig Dispense Refill Last Dose  . aspirin EC 81 MG EC tablet Take 1 tablet (81 mg total) by mouth daily. 30 tablet 0 03/23/2019 at Unknown time  . Calcium Carbonate-Vitamin D3 (CALCIUM 600-D) 600-400 MG-UNIT TABS Take 1 tablet by mouth 2 (two) times a day.   03/23/2019 at Unknown time  . clopidogrel (PLAVIX) 75 MG tablet Take 1 tablet (75 mg total) by mouth daily. 90 tablet 0 03/22/2019 at Unknown time  . docusate sodium (COLACE) 100 MG capsule Take 1 capsule (100 mg total) by mouth 2 (two) times daily. 60 capsule 3 03/23/2019 at Unknown time  . ferrous sulfate 325 (65 FE) MG tablet Take 325 mg by mouth 2 (two) times daily with a meal.   03/23/2019 at Unknown time  . gabapentin (NEURONTIN)  600 MG tablet Take 1 tablet (600 mg total) by mouth 2 (two) times daily. Takes 1 in the morning and two at bedtime (Patient taking differently: Take 600 mg by mouth 2 (two) times daily. Take 1 tablet (600mg ) by mouth every morning and take 2 tablets (1200mg ) by mouth at bedtime) 180 tablet 1 03/22/2019 at Unknown time  . glimepiride (AMARYL) 4 MG tablet Take 4 mg by mouth 2 (two) times daily.   03/23/2019 at Unknown time  . insulin glargine (LANTUS) 100 UNIT/ML injection Inject 0.16 mLs (16 Units total) into the skin at bedtime. (Patient taking differently: Inject 14 Units into the skin at bedtime. ) 10 mL 11 03/22/2019 at Unknown time  . metoprolol tartrate (LOPRESSOR) 25 MG tablet Take 1 tablet (25 mg total) by mouth 2 (two) times daily. 60 tablet 0 03/23/2019 at 0900  . Multiple Vitamin (MULTIVITAMIN) tablet Take 1 tablet by mouth daily.    03/23/2019 at Unknown time  . Omega-3 Fatty Acids (FISH OIL) 1000 MG CAPS Take 1 capsule by mouth 2 (two) times a day.    03/23/2019 at Unknown time  . potassium chloride (K-DUR) 10 MEQ tablet Take 1 tablet (10 meq) by mouth once daily   03/22/2019 at Unknown time  . rosuvastatin (CRESTOR) 40 MG tablet TAKE 1 TABLET BY MOUTH ONCE DAILY (Patient taking differently: Take 40 mg by mouth daily. ) 30 tablet 0 03/22/2019 at Unknown time  . torsemide (DEMADEX) 20 MG tablet Take 2 tablets (40 mg) by mouth twice daily   03/23/2019 at Unknown time  . traMADol (ULTRAM) 50 MG tablet Take 1 tablet (50 mg total) by mouth every 6 (six) hours as needed. (Patient taking differently: Take 50 mg by mouth every 6 (six) hours as needed for moderate pain. ) 120 tablet 5 03/22/2019 at Unknown time  . glucose blood (ONE TOUCH ULTRA TEST) test strip USE AS DIRECTED TO CHECK BLOOD SUGAR 3 TIMES DAILY 300 each 11   . metolazone (ZAROXOLYN) 5 MG tablet Take 1 tablet (5 mg total) by mouth daily for 2 days. Take 30 minutes before Torsemide. (Patient not taking: Reported on 03/23/2019) 2 tablet 0 Completed Course at Unknown time    Inpatient Medications:  . aspirin EC  81 mg Oral Daily  . clopidogrel  75 mg Oral Daily  . docusate sodium  100 mg Oral BID  . enoxaparin (LOVENOX) injection  40 mg Subcutaneous QHS  . ferrous sulfate  325 mg Oral BID WC  . furosemide  80 mg Intravenous BID  . gabapentin  1,200 mg Oral QHS  . gabapentin  600 mg Oral Daily  . gentamicin irrigation  80 mg Irrigation To Cath  . insulin aspart  0-20 Units Subcutaneous TID WC  . insulin aspart  0-5 Units Subcutaneous QHS  . insulin glargine  14 Units Subcutaneous QHS  . multivitamin with minerals  1 tablet Oral Daily  . rosuvastatin  40 mg Oral q1800  . sodium chloride flush  3 mL Intravenous Q12H  . sodium chloride flush  3 mL Intravenous Q12H    Allergies: No Known Allergies  Social History   Socioeconomic History  . Marital status: Married     Spouse name: Molly Maduro  . Number of children: 2  . Years of education: 34  . Highest education level: High school graduate  Occupational History  . Occupation: disabled    Associate Professor: UNEMPLOYED    Comment: 03/2010 for peripheral neuropathy  . 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  Social Needs  . Financial resource strain: Not hard at all  . Food insecurity    Worry: Never true    Inability: Never true  . Transportation needs    Medical: No    Non-medical: No  Tobacco Use  . Smoking status: Never Smoker  . Smokeless tobacco: Never Used  Substance and Sexual Activity  . Alcohol use: No    Alcohol/week: 0.0 standard drinks  . Drug use: No  . Sexual activity: Never    Birth control/protection: Post-menopausal, Surgical  Lifestyle  . Physical activity    Days per week: 0 days    Minutes per session: 0 min  . Stress: Rather much  Relationships  . Social connections    Talks on phone: More than three times a week    Gets together: More than three times a week    Attends religious service: Never    Active member of club or organization: No    Attends meetings of clubs or organizations: Never    Relationship status: Married  . Intimate partner violence    Fear of current or ex partner: No    Emotionally abused: No    Physically abused: No    Forced sexual activity: No  Other Topics Concern  . Not on file  Social History Narrative   Marital status:Married x 45 yrs. Happily married, no abuse.       Children:  2 children (daughter 29, son 24). Two grandsons and 2 step grandchildren.  1 gg.      Lives: with husband, daughter, son-in-law, 2 grandsons, and 1 on the way.      Employment: disability for peripheral neuropathy 2012.  Previously had home daycare.      Tobacco: none      Alcohol: none      Drugs: none      Exercise: none. [employer].      Pets: dog.      Always uses seat belts, smoke detectors in home.      No guns in the home.       Caffeine use:  2 cups coffee per day.       Nutrition: Well balanced diet.     Family History  Problem Relation Age of Onset  . Diabetes Mother   . Hypertension Mother   . Arthritis Mother        knees, lumbar DDD, cervical DDD  . Cancer Father        prostate,skin,lymphoma.  . Cancer Brother 34       bladder cancer; non-smoker  . Diabetes Maternal Grandmother   . Heart disease Maternal Grandmother   . COPD Maternal Grandmother   . Diabetes Paternal Grandmother   . Obesity Brother   . Diabetes Son   . Hypertension Son   . Cancer Maternal Grandfather   . Diabetes Paternal Grandfather      Review of Systems: All other systems reviewed and are otherwise negative except as noted above.  Physical Exam: Vitals:   03/30/19 1406 03/30/19 2038 03/31/19 0040 03/31/19 0703  BP: 110/73 116/66  121/71  Pulse: 95 94 96 91  Resp: 18 16 16 18   Temp: 97.9 F (36.6 C) (!) 97.3 F (36.3 C)  (!) 97.5 F (36.4 C)  TempSrc: Oral Oral  Oral  SpO2: 93% 94% 94% 97%  Weight:      Height:        GEN- Chronically ill appearing. Tearful. alert  and oriented x 3 today.   HEENT: normocephalic, atraumatic; sclera clear, conjunctiva pink; hearing intact; oropharynx clear; neck supple Lungs- Normal work of breathing.   Heart- Regular rate and rhythm GI- Obese, soft, non-tender, non-distended, bowel sounds present Extremities- no clubbing, cyanosis, or edema; DP/PT/radial pulses 2+ bilaterally MS- no significant deformity or atrophy Skin- warm and dry, no rash or lesion Psych- euthymic mood, flat affect Neuro- strength and sensation are intact  Labs:   Lab Results  Component Value Date   WBC 5.0 03/30/2019   HGB 11.1 (L) 03/30/2019   HCT 34.2 (L) 03/30/2019   MCV 83.6 03/30/2019   PLT 143 (L) 03/30/2019    Recent Labs  Lab 03/31/19 0600  NA 132*  K 3.8  CL 87*  CO2 28  BUN 100*  CREATININE 2.60*  CALCIUM 9.1  GLUCOSE 182*      Radiology/Studies: Dg Chest Portable 1 View  Result Date:  03/09/2019 CLINICAL DATA:  Sepsis EXAM: PORTABLE CHEST 1 VIEW COMPARISON:  02/21/2019 FINDINGS: The cardiac silhouette remains enlarged. Prominent interstitial lung markings are again noted. An airspace opacity is again noted in the right mid lung zone which has not changed significantly since the prior study. There is no pneumothorax. Small bilateral pleural effusions are noted. A left-sided lead list pacemaker is noted. IMPRESSION: Persistent findings of congestive heart failure, not significantly changed from prior study. Electronically Signed   By: Katherine Mantle M.D.   On: 03/09/2019 23:51    EKG: 03/24/2019 Sinus tachycardia at 115 bpm with LBBB at 168 ms (personally reviewed)  TELEMETRY: NSR 90s, occasionaly PVCs (personally reviewed)  DEVICE HISTORY: Previously CRT-D implant 06/2014. Near normalization of EF.  Explant 12/2018 in setting of MSSA bacteremia with subsequent fall of her EF to 10-15%. Decision made for re-implantation.   Assessment/Plan: 1. Acute on chronic systolic CHF with ischemic CMP - EF had been near normal with CRT-D, but required explant in setting of MSSA bacteremia. EF subsequently fell back to 10-15% range. Re-implantation has previously been discussed with patient from Dr. Gala Romney and Dr Graciela Husbands. [name] proceed with CRT-D re-implant today per Dr. Elberta Fortis.  2. AKI on CKD III - Creatinine 2.1 -> 2.6 in setting of diuresis. No weights past 2 days. [name] discuss with MD.   3. H/o MSSA bactermia s/p device extraction 12/2018  For questions or updates, please contact [facility] Please consult [email or web address] for contact info under Cardiology/STEMI.  Signed, Graciella Freer, PA-C  03/31/2019 7:10 AM  I have seen and examined this patient with Otilio Saber.  Agree with above, note added to reflect my findings.  On exam, RRR, no murmurs, lungs clear. Patient with ischemic cardiomyopathy. Needs CRT-D reimplant and [name] thus plan for today. Patient's creatinine  elevated and have discussed with HF APP. [name] plan to move ahead with CRT-D unless otherwise discussed.  [name] M. Reeda Soohoo MD 03/31/2019 8:09 AM

## 2019-03-31 NOTE — Plan of Care (Signed)

## 2019-03-31 NOTE — Progress Notes (Signed)
RT placed pt on CPAP dream station for the night in auto titrate max 14 min 4 with 3 Lpm bled into the system. Pt tolerating well and respiratory status stable at this time. RT will continue to monitor.

## 2019-03-31 NOTE — Progress Notes (Signed)
Advanced Heart Failure Rounding Note   Subjective:    Milrinone started on 6/20. Remains on 0.125. Underwent RHC on 6/22 with persistent volume overload.   RA = 16 RV = 57/20 PA = 57/28 (40) PCW = 27 v = 40 Fick cardiac output/index = 5.37/2.24 PVR = 2.5 WU Ao sat = 95% PA sat = 54%, 57%  Lasix increased to 80 IV bid. No weight recorded for 2 days. Creatinine up again.  Creatinine 1.85 -> 2.0 -> 2.1 -> 2.6  On IV amiodarone for atrial tach. Maintaining NSR.   Remains very tearful over her husband's deteriorating condition. Otherwise feels ok. No SOB, orthopnea or PND   Objective:   Weight Range:  Vital Signs:   Temp:  [97.3 F (36.3 C)-97.9 F (36.6 C)] 97.9 F (36.6 C) (06/24 1334) Pulse Rate:  [91-96] 96 (06/24 1334) Resp:  [16-20] 20 (06/24 1334) BP: (116-121)/(66-74) 120/74 (06/24 1334) SpO2:  [94 %-97 %] 97 % (06/24 1413) Last BM Date: 03/25/19  Weight change: Filed Weights   03/27/19 0518 03/28/19 0510 03/29/19 0617  Weight: 131.1 kg 131.5 kg 128.9 kg    Intake/Output:   Intake/Output Summary (Last 24 hours) at 03/31/2019 1542 Last data filed at 03/31/2019 0600 Gross per 24 hour  Intake 691.43 ml  Output 700 ml  Net -8.57 ml     Physical Exam: General:  Sitting up in bed. No resp difficulty HEENT: normal Neck: supple. JVP 7-8 with prominent CV waves. Carotids 2+ bilat; no bruits. No lymphadenopathy or thryomegaly appreciated. Cor: PMI nondisplaced. Regular rate & rhythm. 2/6 TR/MR Lungs: clear Abdomen: obese soft, nontender, nondistended. No hepatosplenomegaly. No bruits or masses. Good bowel sounds. Extremities: no cyanosis, clubbing, rash, trace edema + UNNA boots Neuro: alert & orientedx3, cranial nerves grossly intact. moves all 4 extremities w/o difficulty. Affect pleasant   Telemetry: Sinus 90s with LBBB Personally reviewed   Labs: Basic Metabolic Panel: Recent Labs  Lab 03/25/19 0520  03/27/19 0407 03/28/19 1053 03/29/19 0532  03/29/19 0817 03/29/19 0821 03/30/19 0557 03/31/19 0600  NA 133*   < > 135 134* 132* 134* 137 132* 132*  K 2.8*   < > 3.4* 3.6 3.0* 3.1* 2.9* 3.5 3.8  CL 89*   < > 92* 90* 88*  --   --  88* 87*  CO2 29   < > 29 30 31   --   --  28 28  GLUCOSE 155*   < > 222* 218* 172*  --   --  189* 182*  BUN 100*   < > 104* 101* 96*  --   --  97* 100*  CREATININE 1.75*   < > 2.07* 2.04* 2.05*  --   --  2.11* 2.60*  CALCIUM 9.3   < > 9.0 9.2 9.1  --   --  9.1 9.1  MG 2.1  --   --   --   --   --   --  2.2  --    < > = values in this interval not displayed.    Liver Function Tests: No results for input(s): AST, ALT, ALKPHOS, BILITOT, PROT, ALBUMIN in the last 168 hours. No results for input(s): LIPASE, AMYLASE in the last 168 hours. No results for input(s): AMMONIA in the last 168 hours.  CBC: Recent Labs  Lab 03/25/19 0520 03/29/19 0817 03/29/19 0821 03/30/19 0557  WBC 7.5  --   --  5.0  HGB 11.2* 11.2* 10.5* 11.1*  HCT  34.7* 33.0* 31.0* 34.2*  MCV 84.0  --   --  83.6  PLT 183  --   --  143*    Cardiac Enzymes: No results for input(s): CKTOTAL, CKMB, CKMBINDEX, TROPONINI in the last 168 hours.  BNP: BNP (last 3 results) Recent Labs    01/22/19 0929 01/25/19 1603 02/16/19 1115  BNP 1,208.0* 971.0* 1,289.0*    ProBNP (last 3 results) No results for input(s): PROBNP in the last 8760 hours.    Other results:  Imaging: No results found.   Medications:     Scheduled Medications: . [MAR Hold] aspirin EC  81 mg Oral Daily  . [MAR Hold] Chlorhexidine Gluconate Cloth  6 each Topical Daily  . [MAR Hold] clopidogrel  75 mg Oral Daily  . [MAR Hold] docusate sodium  100 mg Oral BID  . [MAR Hold] enoxaparin (LOVENOX) injection  40 mg Subcutaneous QHS  . [MAR Hold] ferrous sulfate  325 mg Oral BID WC  . [MAR Hold] gabapentin  1,200 mg Oral QHS  . [MAR Hold] gabapentin  600 mg Oral Daily  . gentamicin irrigation  80 mg Irrigation To Cath  . [MAR Hold] insulin aspart  0-20 Units  Subcutaneous TID WC  . [MAR Hold] insulin aspart  0-5 Units Subcutaneous QHS  . [MAR Hold] insulin glargine  14 Units Subcutaneous QHS  . [MAR Hold] multivitamin with minerals  1 tablet Oral Daily  . [MAR Hold] mupirocin ointment  1 application Nasal BID  . [MAR Hold] rosuvastatin  40 mg Oral q1800  . [MAR Hold] sodium chloride flush  3 mL Intravenous Q12H  . [MAR Hold] sodium chloride flush  3 mL Intravenous Q12H    Infusions: . [MAR Hold] sodium chloride 10 mL/hr at 03/25/19 1855  . [MAR Hold] sodium chloride    . sodium chloride 50 mL/hr at 03/31/19 0929  . sodium chloride 10 mL/hr (03/31/19 1420)  . amiodarone 30 mg/hr (03/31/19 0933)  . milrinone 0.125 mcg/kg/min (03/31/19 0600)    PRN Medications: [MAR Hold] sodium chloride, [MAR Hold] sodium chloride, sodium chloride, [MAR Hold] acetaminophen, fentaNYL, Heparin (Porcine) in NaCl, lidocaine (PF), midazolam, [MAR Hold] ondansetron (ZOFRAN) IV, [MAR Hold] sodium chloride flush, [MAR Hold] sodium chloride flush, [MAR Hold] traMADol   Assessment/Plan:   1. Acute on chronic CHF due to mixed ICM.NICM with LBBB (118ms) - Echo 6/15 EF 10-15% (down from 40-45% prior to removal of CRT) - RHC on 6/22 with elevated filling pressures. IV lasix restarted - Volume status much improved. But creatinine up. Will stop lasix for now - Continue milrinone for now - No ACE/ARB with AKI. No b-blocker with ADHF - Can consider HDL/NTG as BP tolerates - follow BMET, I/O, daily wts - For CRT-D today. We discussed waiting one more day due to AKI but she wants to move forward today. I d/w with EP personally.  - Hopefully EF & HF will improve with CRT  2. Acute on chronic CKD III - Creatinine 2.4 -> 2.0 -> 1.75 -> 1.85 -. 2.03 -> 2.07 -> 2.05 -> 2.11 -> 2.6 - baseline creatinine 1.7-1.8 - hold lasix.   3. PAT & NSVT - recurrent - Suppressed with IV amio. Will continue. No need for [facility] for atrial tach.  D/w Dr Caryl Comes - Keep K> 4.0 Mg > 2.0 K 3.8  today  4. CAD - no s/s ischemia   - last cath in 12/19 with severe LAD mid to distal LAD disease after previous stent. No PCI options -  continue statin and DAPT  5. DM2 - Sugars are up  - Lantus adjusted yesterday and now improved - cover with SSI - HgBa1c 6.7  6. H/o PAD - s/p bilateral toe amputations due to non-healing wounds  7. Morbid obesity  8. Hypokalemia - k 3.8 will supp   Length of Stay: 8   Aleya Durnell MD 03/31/2019, 3:42 PM  Advanced Heart Failure Team Pager [phone] (M-F; [place])  Please contact [facility] for night-coverage after hours (4p -7a ) and weekends on [email or web address]

## 2019-03-31 NOTE — H&P (Signed)
ICD Criteria  Current LVEF:15-20%. Within 12 months prior to implant: Yes   Heart failure history: Yes, Class III  Cardiomyopathy history: Yes, Ischemic Cardiomyopathy - Prior MI.  Atrial Fibrillation/Atrial Flutter: No.  Ventricular tachycardia history: No.  Cardiac arrest history: No.  History of syndromes with risk of sudden death: No.  Previous ICD: Yes, Reason for ICD:  Primary prevention.  Current ICD indication: Primary  PPM indication: No.  Class I or II Bradycardia indication present: No  Beta Blocker therapy for 3 or more months: Yes, prescribed.   Ace Inhibitor/ARB therapy for 3 or more months: Yes, prescribed.    I have seen [name] is a 63 y.o. femalepre-procedural and has been referred by Bensimhon for consideration of ICD implant for primary prevention of sudden death.  The patient's chart has been reviewed and they meet criteria for ICD implant.  I have had a thorough discussion with the patient reviewing options.  The patient and their family (if available) have had opportunities to ask questions and have them answered. The patient and I have decided together through the [facility] to implant ICD at this time.  Risks, benefits, alternatives to ICD implantation were discussed in detail with the patient today. The patient  understands that the risks include but are not limited to bleeding, infection, pneumothorax, perforation, tamponade, vascular damage, renal failure, MI, stroke, death, inappropriate shocks, and lead dislodgement and  wishes to proceed.

## 2019-04-01 ENCOUNTER — Inpatient Hospital Stay (HOSPITAL_COMMUNITY): Payer: PPO

## 2019-04-01 ENCOUNTER — Encounter (HOSPITAL_COMMUNITY): Payer: Self-pay | Admitting: Cardiology

## 2019-04-01 LAB — BASIC METABOLIC PANEL
Anion gap: 16 — ABNORMAL HIGH (ref 5–15)
BUN: 100 mg/dL — ABNORMAL HIGH (ref 8–23)
CO2: 27 mmol/L (ref 22–32)
Calcium: 8.9 mg/dL (ref 8.9–10.3)
Chloride: 89 mmol/L — ABNORMAL LOW (ref 98–111)
Creatinine, Ser: 2.66 mg/dL — ABNORMAL HIGH (ref 0.44–1.00)
GFR calc Af Amer: 21 mL/min — ABNORMAL LOW (ref >60)
GFR calc non Af Amer: 18 mL/min — ABNORMAL LOW (ref >60)
Glucose, Bld: 225 mg/dL — ABNORMAL HIGH (ref 70–99)
Potassium: 3.4 mmol/L — ABNORMAL LOW (ref 3.5–5.1)
Sodium: 132 mmol/L — ABNORMAL LOW (ref 135–145)

## 2019-04-01 LAB — GLUCOSE, CAPILLARY
Glucose-Capillary: 194 mg/dL — ABNORMAL HIGH (ref 70–99)
Glucose-Capillary: 282 mg/dL — ABNORMAL HIGH (ref 70–99)
Glucose-Capillary: 253 mg/dL — ABNORMAL HIGH (ref 70–99)
Glucose-Capillary: 213 mg/dL — ABNORMAL HIGH (ref 70–99)

## 2019-04-01 MED ORDER — FUROSEMIDE 10 MG/ML IJ SOLN
80.0000 mg | Freq: Once | INTRAMUSCULAR | Status: AC
  Administered 2019-04-01: 80 mg via INTRAVENOUS
  Filled 2019-04-01: qty 8

## 2019-04-01 MED ORDER — POTASSIUM CHLORIDE CRYS ER 20 MEQ PO TBCR: 30.0000 meq | EXTENDED_RELEASE_TABLET | ORAL | Status: DC

## 2019-04-01 MED ORDER — POTASSIUM CHLORIDE CRYS ER 20 MEQ PO TBCR
30.0000 meq | EXTENDED_RELEASE_TABLET | Freq: Once | ORAL | Status: AC
  Administered 2019-04-01: 09:00:00 30 meq via ORAL
  Filled 2019-04-01: qty 1

## 2019-04-01 NOTE — Care Management Important Message (Signed)
Important Message  Patient Details  Name: DEAJA RIZO MRN: [id number] Date of Birth: [birth year]   Medicare Important Message Given:  Yes     Shelda Altes 04/01/2019, 1:35 PM

## 2019-04-01 NOTE — Progress Notes (Signed)
   04/01/19 2200  Clinical Encounter Type  Visited With Patient  Visit Type Follow-up;Psychological support;Spiritual support  Spiritual Encounters  Spiritual Needs Grief support;Emotional;Prayer  Stress Factors  Patient Stress Factors Major life changes;Loss;Loss of control;Family relationships   F/u visit w/ pt, in particular b/c her husband died in hospital yesterday while he was in-patient as well.  Supported Regulatory affairs officer, supported her sources of strength (pt is determined that she will make it through this, have friends she can connect with day or night, her faith).  Prayed with pt.    Myra Gianotti resident, [phone]

## 2019-04-01 NOTE — Progress Notes (Addendum)
Electrophysiology Rounding Note  Patient Name: [name] Date of Encounter: 04/01/2019  Primary Cardiologist: Dr. Haroldine Laws  Electrophysiologist: Dr. Caryl Comes   Subjective   Feeling OK today. Site stable, with mild ecchymosis. Anxious to go home.  Inpatient Medications    Scheduled Meds: . aspirin EC  81 mg Oral Daily  . Chlorhexidine Gluconate Cloth  6 each Topical Daily  . clopidogrel  75 mg Oral Daily  . docusate sodium  100 mg Oral BID  . enoxaparin (LOVENOX) injection  40 mg Subcutaneous QHS  . ferrous sulfate  325 mg Oral BID WC  . gabapentin  1,200 mg Oral QHS  . gabapentin  600 mg Oral Daily  . insulin aspart  0-20 Units Subcutaneous TID WC  . insulin aspart  0-5 Units Subcutaneous QHS  . insulin glargine  14 Units Subcutaneous QHS  . multivitamin with minerals  1 tablet Oral Daily  . mupirocin ointment  1 application Nasal BID  . rosuvastatin  40 mg Oral q1800  . sodium chloride flush  3 mL Intravenous Q12H  . sodium chloride flush  3 mL Intravenous Q12H   Continuous Infusions: . sodium chloride 10 mL/hr at 03/25/19 1855  . sodium chloride    . amiodarone 30 mg/hr (04/01/19 0659)  .  ceFAZolin (ANCEF) IV 1 g (04/01/19 0255)  . milrinone 0.125 mcg/kg/min (03/31/19 1707)   PRN Meds: sodium chloride, sodium chloride, acetaminophen, ondansetron (ZOFRAN) IV, sodium chloride flush, sodium chloride flush, traMADol   Vital Signs    Vitals:   03/31/19 1842 03/31/19 1857 03/31/19 2057 04/01/19 0656  BP: 137/70 125/71 121/71 114/64  Pulse: (!) 102 (!) 101 97 99  Resp:   20 20  Temp:   97.6 F (36.4 C) 98 F (36.7 C)  TempSrc:   Oral Oral  SpO2: 94% 92% 93% 98%  Weight:    131 kg  Height:        Intake/Output Summary (Last 24 hours) at 04/01/2019 0737 Last data filed at 04/01/2019 0700 Gross per 24 hour  Intake 408.42 ml  Output 500 ml  Net -91.58 ml   Filed Weights   03/28/19 0510 03/29/19 0617 04/01/19 0656  Weight: 131.5 kg 128.9 kg 131 kg     Physical Exam    GEN- Appearing, alert and oriented x 3 today.   Head- normocephalic, atraumatic Eyes-  Sclera clear, conjunctiva pink Ears- hearing intact Oropharynx- clear Neck- supple Lungs- Normal work of breathing Heart- Regular rate and rhythm GI- soft, NT, ND, + BS Extremities- no clubbing or cyanosis. Trace to 1+ edema Skin- no rash or lesion Psych- euthymic mood, full affect Neuro- strength and sensation are intact  Labs    CBC Recent Labs    03/29/19 0821 03/30/19 0557  WBC  --  5.0  HGB 10.5* 11.1*  HCT 31.0* 34.2*  MCV  --  83.6  PLT  --  440*   Basic Metabolic Panel Recent Labs    03/30/19 0557 03/31/19 0600 04/01/19 0508  NA 132* 132* 132*  K 3.5 3.8 3.4*  CL 88* 87* 89*  CO2 28 28 27   GLUCOSE 189* 182* 225*  BUN 97* 100* 100*  CREATININE 2.11* 2.60* 2.66*  CALCIUM 9.1 9.1 8.9  MG 2.2  --   --    Liver Function Tests No results for input(s): AST, ALT, ALKPHOS, BILITOT, PROT, ALBUMIN in the last 72 hours. No results for input(s): LIPASE, AMYLASE in the last 72 hours. Cardiac Enzymes No results  for input(s): CKTOTAL, CKMB, CKMBINDEX, TROPONINI in the last 72 hours. BNP Invalid input(s): POCBNP D-Dimer No results for input(s): DDIMER in the last 72 hours. Hemoglobin A1C No results for input(s): HGBA1C in the last 72 hours. Fasting Lipid Panel No results for input(s): CHOL, HDL, LDLCALC, TRIG, CHOLHDL, LDLDIRECT in the last 72 hours. Thyroid Function Tests No results for input(s): TSH, T4TOTAL, T3FREE, THYROIDAB in the last 72 hours.  Invalid input(s): FREET3  Telemetry    V paced 90s (personally reviewed)  Radiology    No results found.   CXR formal read pending. Leads look to be in stable position.  Patient Profile     [name] is a 63 y.o. female with a history of Mixed cardiomyopathy, h/o CRT-D s/p explant at [facility] 12/2018 for MSSA bacteremia, Chronic systolic CHF, LBBB, and CKD. Admitted for HF optimization prior to  re-implantation of CRT-D.   Assessment & Plan    1. Acute on chronic systolic CHF with ischemic CMP - s/p re-implantation of MDT CRT-D. Device site, CXR, and device function stable. Remains volume overloaded. Further per HF team.   2. AKI on CKD III - Creatinine 2.1 -> 2.6 -> 2.66. Given minimal dye with procedure yesterday. Continue to follow.   3. H/o MSSA bactermia s/p device extraction 12/2018 - Continue education of infection prevention.  4. Atrial tachycardia - [name] follow atrial rates and burden through her device. No current indication for Unity.   For questions or updates, please contact [place] Please consult [email or web address] for contact info under Cardiology/STEMI.  Signed, Shirley Friar, PA-C  04/01/2019, 7:37 AM    I have seen and examined this patient with Oda Kilts.  Agree with above, note added to reflect my findings.  Med[employer]onic CRT-D implanted yesterday.  Device functioning appropriately.  Chest x-ray without any major abnormalities other than continued pulmonary edema.  We Purva Vessell arrange for follow-up in EP clinic. Would hold off on anticoagulation   Jabron Weese M. Mickal Meno MD 04/01/2019 7:47 AM

## 2019-04-01 NOTE — Progress Notes (Signed)
Advanced Heart Failure Rounding Note   Subjective:    Milrinone started on 6/20. Remains on 0.125. Underwent RHC on 6/22 with persistent volume overload.   RA = 16 RV = 57/20 PA = 57/28 (40) PCW = 27 v = 40 Fick cardiac output/index = 5.37/2.24 PVR = 2.5 WU Ao sat = 95% PA sat = 54%, 57%  Lasix increased to 80 IV bid.Lasix stopped yesterday with AKI. Underwent CRT-D replacement yesterday  Creatinine 1.85 -> 2.0 -> 2.1 -> 2.6 -> 2.66  On IV amiodarone for atrial tach. Maintaining NSR.   Anxious to go home as she is very distraught due to her husband's death in 57M last night. Denis SOB, orthopnea or PND.    Objective:   Weight Range:  Vital Signs:   Temp:  [97.4 F (36.3 C)-98 F (36.7 C)] 98 F (36.7 C) (06/25 0656) Pulse Rate:  [31-145] 99 (06/25 0656) Resp:  [0-117] 20 (06/25 0656) BP: (114-138)/(48-93) 114/64 (06/25 0656) SpO2:  [87 %-100 %] 98 % (06/25 0656) Weight:  [131 kg] 131 kg (06/25 0656) Last BM Date: 03/25/19  Weight change: Filed Weights   03/28/19 0510 03/29/19 0617 04/01/19 0656  Weight: 131.5 kg 128.9 kg 131 kg    Intake/Output:   Intake/Output Summary (Last 24 hours) at 04/01/2019 0811 Last data filed at 04/01/2019 0700 Gross per 24 hour  Intake 408.42 ml  Output 500 ml  Net -91.58 ml     Physical Exam: General:  Sitting up in bed. No resp difficulty HEENT: normal Neck: supple. JVP 10 prominent CV waves Carotids 2+ bilat; no bruits. No lymphadenopathy or thryomegaly appreciated. Cor: PMI nondisplaced. Regular rate & rhythm. 2/6 TR ICD site with mild ecchymosis and hematoma Lungs: clear Abdomen: obesesoft, nontender, nondistended. No hepatosplenomegaly. No bruits or masses. Good bowel sounds. Extremities: no cyanosis, clubbing, rash, 2+ edema with UNNA boots Neuro: alert & orientedx3, cranial nerves grossly intact. moves all 4 extremities w/o difficulty. Affect pleasant but sad    Telemetry: Sinus 95 with LV pacing occasional PVC  Personally reviewed   Labs: Basic Metabolic Panel: Recent Labs  Lab 03/28/19 1053 03/29/19 0532 03/29/19 0817 03/29/19 0821 03/30/19 0557 03/31/19 0600 04/01/19 0508  NA 134* 132* 134* 137 132* 132* 132*  K 3.6 3.0* 3.1* 2.9* 3.5 3.8 3.4*  CL 90* 88*  --   --  88* 87* 89*  CO2 30 31  --   --  28 28 27   GLUCOSE 218* 172*  --   --  189* 182* 225*  BUN 101* 96*  --   --  97* 100* 100*  CREATININE 2.04* 2.05*  --   --  2.11* 2.60* 2.66*  CALCIUM 9.2 9.1  --   --  9.1 9.1 8.9  MG  --   --   --   --  2.2  --   --     Liver Function Tests: No results for input(s): AST, ALT, ALKPHOS, BILITOT, PROT, ALBUMIN in the last 168 hours. No results for input(s): LIPASE, AMYLASE in the last 168 hours. No results for input(s): AMMONIA in the last 168 hours.  CBC: Recent Labs  Lab 03/29/19 0817 03/29/19 0821 03/30/19 0557  WBC  --   --  5.0  HGB 11.2* 10.5* 11.1*  HCT 33.0* 31.0* 34.2*  MCV  --   --  83.6  PLT  --   --  143*    Cardiac Enzymes: No results for input(s): CKTOTAL, CKMB, CKMBINDEX, TROPONINI  in the last 168 hours.  BNP: BNP (last 3 results) Recent Labs    01/22/19 0929 01/25/19 1603 02/16/19 1115  BNP 1,208.0* 971.0* 1,289.0*    ProBNP (last 3 results) No results for input(s): PROBNP in the last 8760 hours.    Other results:  Imaging: No results found.   Medications:     Scheduled Medications: . aspirin EC  81 mg Oral Daily  . Chlorhexidine Gluconate Cloth  6 each Topical Daily  . clopidogrel  75 mg Oral Daily  . docusate sodium  100 mg Oral BID  . enoxaparin (LOVENOX) injection  40 mg Subcutaneous QHS  . ferrous sulfate  325 mg Oral BID WC  . gabapentin  1,200 mg Oral QHS  . gabapentin  600 mg Oral Daily  . insulin aspart  0-20 Units Subcutaneous TID WC  . insulin aspart  0-5 Units Subcutaneous QHS  . insulin glargine  14 Units Subcutaneous QHS  . multivitamin with minerals  1 tablet Oral Daily  . mupirocin ointment  1 application Nasal BID   . rosuvastatin  40 mg Oral q1800  . sodium chloride flush  3 mL Intravenous Q12H  . sodium chloride flush  3 mL Intravenous Q12H    Infusions: . sodium chloride 10 mL/hr at 03/25/19 1855  . sodium chloride    . amiodarone 30 mg/hr (04/01/19 0659)  .  ceFAZolin (ANCEF) IV 1 g (04/01/19 0255)  . milrinone 0.125 mcg/kg/min (03/31/19 1707)    PRN Medications: sodium chloride, sodium chloride, acetaminophen, ondansetron (ZOFRAN) IV, sodium chloride flush, sodium chloride flush, traMADol   Assessment/Plan:   1. Acute on chronic CHF due to mixed ICM.NICM with LBBB (185ms) - Echo 6/15 EF 10-15% (down from 40-45% prior to removal of CRT) - RHC on 6/22 with elevated filling pressures. IV lasix restarted - Volume status appears elevated again on exam and still with mild edema on CXR today but BUN/CR up.  Will give just one dose IV lasix today and follow. (weight up 4 pounds overnight and nearing admit weight). Will CKD and severe biventricular failure - this may be as good as we can get. Will re-evalaute in am. - Continue milrinone 0.125 one more day until renal function turns the corner. No PICC with recent CRT placement - No ACE/ARB with AKI. No b-blocker with ADHF - Can consider HDL/NTG as BP tolerates - follow BMET, I/O, daily wts - s/p CRT-D replacement 6/25 - She remains very tenuous with biventricular failure Hopefully EF & HF will improve with CRT. She has been having frequent PVCs too and now being suppressed with amio so hopefully this may help as well -She is very eager to go home today after her husband's death yesterday. I told her we would do our best to get her out expeditiousy but she just isnt ready yet  2. Acute on chronic CKD III - Creatinine 2.4 -> 2.0 -> 1.75 -> 1.85 -. 2.03 -> 2.07 -> 2.05 -> 2.11 -> 2.6 -> 2.66 - baseline creatinine 1.7-1.8 - lasix dosing as above Continue milrinone 1 more day  - got minimal contrast yesterday with CRT-D  3. PAT & NSVT, frequent  PVCs - recurrent - Suppressed with IV amio. Will continue IV amio while on milrinone. No need for [facility] for atrial tach.  D/w Dr Caryl Comes previously. Await further input from EP Consult team regarding management - Keep K> 4.0 Mg > 2.0   4. CAD - no s/s ischemia   - last cath in  12/19 with severe LAD mid to distal LAD disease after previous stent. No PCI options - continue statin and DAPT  5. DM2 - Lantus adjusted and now improved - cover with SSI - HgBa1c 6.7  6. H/o PAD - s/p bilateral toe amputations due to non-healing wounds  7. Morbid obesity  8. Hypokalemia - k 3.4 will supp gently  Length of Stay: 9   Lilyth Lawyer MD 04/01/2019, 8:11 AM  Advanced Heart Failure Team Pager [phone] (M-F; 7a - 4p)  Please contact [facility] for night-coverage after hours (4p -7a ) and weekends on [email or web address]

## 2019-04-01 NOTE — Progress Notes (Signed)
Orthopedic Tech Progress Note Patient Details:  [name] October 15, 1955 [id number] Patient has arm sling Patient ID: [name], female   DOB: 1956-01-24, 63 y.o.   MRN: [id number]   [name] 04/01/2019, 8:35 AM

## 2019-04-01 NOTE — Progress Notes (Signed)
Discussed with Dr Curt Bears plan for amiodarone with atrial tach. When ready for discharge, would send home on amiodarone 400mg  bid x2 weeks then 200mg  bid x2 weeks then 200mg  daily.  Outpatient follow up with Dr Caryl Comes arranged.  Chanetta Marshall, NP 04/01/2019 3:21 PM

## 2019-04-02 LAB — BASIC METABOLIC PANEL
Anion gap: 15 (ref 5–15)
BUN: 108 mg/dL — ABNORMAL HIGH (ref 8–23)
CO2: 27 mmol/L (ref 22–32)
Calcium: 8.7 mg/dL — ABNORMAL LOW (ref 8.9–10.3)
Chloride: 89 mmol/L — ABNORMAL LOW (ref 98–111)
Creatinine, Ser: 3.57 mg/dL — ABNORMAL HIGH (ref 0.44–1.00)
GFR calc Af Amer: 15 mL/min — ABNORMAL LOW (ref >60)
GFR calc non Af Amer: 13 mL/min — ABNORMAL LOW (ref >60)
Glucose, Bld: 215 mg/dL — ABNORMAL HIGH (ref 70–99)
Potassium: 3.8 mmol/L (ref 3.5–5.1)
Sodium: 131 mmol/L — ABNORMAL LOW (ref 135–145)

## 2019-04-02 LAB — GLUCOSE, CAPILLARY
Glucose-Capillary: 200 mg/dL — ABNORMAL HIGH (ref 70–99)
Glucose-Capillary: 242 mg/dL — ABNORMAL HIGH (ref 70–99)

## 2019-04-02 MED ORDER — ENOXAPARIN SODIUM 30 MG/0.3ML ~~LOC~~ SOLN: 30.0000 mg | Freq: Every day | SUBCUTANEOUS | Status: DC

## 2019-04-02 MED ORDER — MUPIROCIN 2 % EX OINT: 1.0000 "application " | TOPICAL_OINTMENT | Freq: Two times a day (BID) | CUTANEOUS | 0 refills | Status: AC

## 2019-04-02 MED ORDER — AMIODARONE HCL 200 MG PO TABS: ORAL_TABLET | ORAL | 6 refills | Status: DC

## 2019-04-02 MED ORDER — TORSEMIDE 20 MG PO TABS: 40.0000 mg | ORAL_TABLET | Freq: Two times a day (BID) | ORAL | 6 refills | Status: DC

## 2019-04-02 NOTE — Progress Notes (Signed)
Inpatient Diabetes Program Recommendations  AACE/ADA: New Consensus Statement on Inpatient Glycemic Control (2015)  Target Ranges:  Prepandial:   less than 140 mg/dL      Peak postprandial:   less than 180 mg/dL (1-2 hours)      Critically ill patients:  140 - 180 mg/dL   Lab Results  Component Value Date   GLUCAP 200 (H) 04/02/2019   HGBA1C 6.7 (H) 03/23/2019    Review of Glycemic Control Results for [name], [name] (MRN [id number]) as of 04/02/2019 12:18  Ref. Range 04/01/2019 11:26 04/01/2019 17:11 04/01/2019 21:14 04/02/2019 07:31  Glucose-Capillary Latest Ref Range: 70 - 99 mg/dL 282 (H) 253 (H) 213 (H) 200 (H)   Outpatient Diabetes medications: Lantus 14 units QHS, Amaryl 4 mg BID Current orders for Inpatient glycemic control: Lantus 14 units QHS, Novolog 0-20 units TID with meals, Novolog 0-5 units QHS   Inpatient Diabetes Program Recommendations:    If to remain inpatient:   Post prandial glucose is consistently elevated.  Please consider ordering Novolog 3 units TID with meals for meal coverage if patient eats at least 50% of meals.  Thanks, Bronson Curb, MSN, RNC-OB Diabetes Coordinator [phone] (8a-5p)

## 2019-04-02 NOTE — Progress Notes (Addendum)
Advanced Heart Failure Rounding Note   Subjective:    Milrinone started on 6/20.Marland Kitchen Underwent RHC on 6/22 with persistent volume overload.   S/P  CRT-D 6/24/20202  Remains on milrinone 0.125 mcg. Yesterday she was given 80 mg IV lasix.   Creatinine 1.85 -> 2.0 -> 2.1 -> 2.6 -> 2.66->3.5   Tearful and anxious to go home to make her husbands funeral arrangements.  Denies SOB.   Objective:   Weight Range:  Vital Signs:   Temp:  [97.8 F (36.6 C)-98 F (36.7 C)] 98 F (36.7 C) (06/26 0409) Pulse Rate:  [91-96] 96 (06/26 0409) Resp:  [18-20] 18 (06/26 0409) BP: (111-122)/(62-67) 122/62 (06/26 0409) SpO2:  [98 %-100 %] 99 % (06/26 0409) Weight:  [130.6 kg] 130.6 kg (06/26 0409) Last BM Date: 03/29/19  Weight change: Filed Weights   03/29/19 0617 04/01/19 0656 04/02/19 0409  Weight: 128.9 kg 131 kg 130.6 kg    Intake/Output:   Intake/Output Summary (Last 24 hours) at 04/02/2019 1242 Last data filed at 04/02/2019 1100 Gross per 24 hour  Intake 1138.39 ml  Output 50 ml  Net 1088.39 ml     Physical Exam: General:  Sitting in the chair. No resp difficulty HEENT: normal Neck: supple. JVP 7-8 with CV waves. . Carotids 2+ bilat; no bruits. No lymphadenopathy or thryomegaly appreciated. Cor: PMI nondisplaced. Regular rate & rhythm. No rubs, gallops or murmurs. L upper chest dressing.  Lungs: LLL crackles on 4 liters.  Abdomen: soft, nontender, nondistended. No hepatosplenomegaly. No bruits or masses. Good bowel sounds. Extremities: no cyanosis, clubbing, rash, R and LLE unnal boots. edema Neuro: alert & orientedx3, cranial nerves grossly intact. moves all 4 extremities w/o difficulty. Affect flat    Telemetry: V paced 90s    Labs: Basic Metabolic Panel: Recent Labs  Lab 03/29/19 0532  03/29/19 0821 03/30/19 0557 03/31/19 0600 04/01/19 0508 04/02/19 0357  NA 132*   < > 137 132* 132* 132* 131*  K 3.0*   < > 2.9* 3.5 3.8 3.4* 3.8  CL 88*  --   --  88* 87* 89*  89*  CO2 31  --   --  28 28 27 27   GLUCOSE 172*  --   --  189* 182* 225* 215*  BUN 96*  --   --  97* 100* 100* 108*  CREATININE 2.05*  --   --  2.11* 2.60* 2.66* 3.57*  CALCIUM 9.1  --   --  9.1 9.1 8.9 8.7*  MG  --   --   --  2.2  --   --   --    < > = values in this interval not displayed.    Liver Function Tests: No results for input(s): AST, ALT, ALKPHOS, BILITOT, PROT, ALBUMIN in the last 168 hours. No results for input(s): LIPASE, AMYLASE in the last 168 hours. No results for input(s): AMMONIA in the last 168 hours.  CBC: Recent Labs  Lab 03/29/19 0817 03/29/19 0821 03/30/19 0557  WBC  --   --  5.0  HGB 11.2* 10.5* 11.1*  HCT 33.0* 31.0* 34.2*  MCV  --   --  83.6  PLT  --   --  143*    Cardiac Enzymes: No results for input(s): CKTOTAL, CKMB, CKMBINDEX, TROPONINI in the last 168 hours.  BNP: BNP (last 3 results) Recent Labs    01/22/19 0929 01/25/19 1603 02/16/19 1115  BNP 1,208.0* 971.0* 1,289.0*    ProBNP (last 3 results)  No results for input(s): PROBNP in the last 8760 hours.    Other results:  Imaging: Dg Chest 2 View  Result Date: 04/01/2019 CLINICAL DATA:  Pacemaker placement EXAM: CHEST - 2 VIEW COMPARISON:  March 09, 2019 FINDINGS: There is no a pacemaker present on the left. Lead tips are attached to the right atrium, right ventricle, and coronary sinus. No pneumothorax. There is cardiomegaly with pulmonary venous hypertension. There is a lentiform shaped opacity in the right mid lung which may represent fluid tracking along the minor fissure. There is also apparent airspace consolidation more posteriorly posterior segment right upper lobe. There may be loculated effusion in this area as well. There is mild interstitial pulmonary edema. There is aortic atherosclerosis. No adenopathy appreciable. No bone lesions. IMPRESSION: 1. Pacemaker lead tips attached to right atrium, right ventricle, and coronary sinus. No pneumothorax. 2. Pulmonary vascular  congestion with interstitial edema. Probable loculated pleural effusion along the right minor fissure. Airspace opacity more posteriorly in the right upper lobe may in part be due to effusion, although suspect superimposed airspace consolidation, likely pneumonia. Noncontrast enhanced CT may be helpful to further assess in this regard. 3.  Aortic Atherosclerosis (ICD10-I70.0). Electronically Signed   By: Lowella Grip III M.D.   On: 04/01/2019 09:34     Medications:     Scheduled Medications: . aspirin EC  81 mg Oral Daily  . Chlorhexidine Gluconate Cloth  6 each Topical Daily  . clopidogrel  75 mg Oral Daily  . docusate sodium  100 mg Oral BID  . enoxaparin (LOVENOX) injection  30 mg Subcutaneous QHS  . ferrous sulfate  325 mg Oral BID WC  . gabapentin  1,200 mg Oral QHS  . gabapentin  600 mg Oral Daily  . insulin aspart  0-20 Units Subcutaneous TID WC  . insulin aspart  0-5 Units Subcutaneous QHS  . insulin glargine  14 Units Subcutaneous QHS  . multivitamin with minerals  1 tablet Oral Daily  . mupirocin ointment  1 application Nasal BID  . rosuvastatin  40 mg Oral q1800  . sodium chloride flush  3 mL Intravenous Q12H  . sodium chloride flush  3 mL Intravenous Q12H    Infusions: . sodium chloride 10 mL/hr at 03/25/19 1855  . sodium chloride    . amiodarone 30 mg/hr (04/02/19 0546)  . milrinone 0.125 mcg/kg/min (04/02/19 0545)    PRN Medications: sodium chloride, sodium chloride, acetaminophen, ondansetron (ZOFRAN) IV, sodium chloride flush, sodium chloride flush, traMADol   Assessment/Plan:   1. 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  NICM with LBBB (136ms) - Echo 6/15 EF 10-15% (down from 40-45% prior to removal of CRT) - RHC on 6/22 with elevated filling pressures. IV lasix restarted - With CKD and severe biventricular failure - this may be as good as we can get.  -  No PICC with recent CRT placement - Creatinine trending up. She received IV lasix 6/25.  - No  diuretics for the next 2 days then wound restart torsemide 40 mg twice a day.  - No ACE/ARB with AKI. No b-blocker with ADHF - NO room for hydralazine/nitrate.  - s/p CRT-D replacement 6/25 - She remains very tenuous with biventricular failure Hopefully EF & HF will improve with CRT. She has been having frequent PVCs too and now being suppressed with amio so hopefully this may help as well  2. Acute on chronic CKD III - Creatinine 2.6>3.5  - baseline creatinine 1.7-1.8 - got minimal  contrast yesterday with CRT-D + IV lasix.   3. PAT & NSVT, frequent PVCs - recurrent. - Suppressed with IV amio. No need for [facility] for atrial tach.  - Per EPamiodarone 400mg  bid x2 weeks then 200mg  bid x2 weeks then 200mg  daily.  4. CAD - no s/s ischemia   - last cath in 12/19 with severe LAD mid to distal LAD disease after previous stent. No PCI options - continue statin and DAPT  5. DM2 - Lantus adjusted and now improved - cover with SSI - HgBa1c 6.7  6. H/o PAD - s/p bilateral toe amputations due to non-healing wounds  7. Morbid obesity  8. Hypokalemia - Stable.   Ideally she would stay in the hospital today but she given the death of her husband it is possible we will discharge him today.   She has AHC and will need HH to resume. She has oxygen at home and uses a walker. She is adamant she needs to go home to arranger her husbands funeral. She will need BMET on Monday.   I will discuss with Dr Aundra Dubin for possible d/c today.   Length of Stay: [place] NP-C  04/02/2019, 12:42 PM  Advanced Heart Failure Team Pager [phone] (M-F; 7a - 4p)  Please contact [facility] for night-coverage after hours (4p -7a ) and weekends on [email or web address]  Patient seen with NP, agree with the above note.    Difficult situation.  Creatinine is up to 3.5 today after IV Lasix yesterday with BUN 108.  She still is volume overloaded on exam but was able to walk about 50 feet down the hall before getting  short of breath.  She remains on milrinone 0.125.    She is adamant that she leave the hospital to arrange her husband's funeral (died 2 days ago).    On exam, JVP 10-12 cm.  Regular S1S2. 1+ edema to knees.    BiV defibrillator is functioning appropriately.   Long discussion with patient about plan.  I told her that I would prefer that she stay in the hospital to monitor her.  Will need to eventually restart her diuretics when BUN/creatinine comes down and she is still on milrinone 0.125.  However, given her current family issues, she strongly desires discharge.   Therefore, I will stop milrinone.  Will reassess her in a couple of hours to make sure that she is stable symptomatically.  Will transition amiodarone to po.  Tomorrow evening, I will start her on torsemide 40 mg po bid. She will need BMET on Monday and close followup in CHF clinic early next week.  I told her that if she starts to feel worse at home, she needs to immediately return to the hospital.  I am concerned that she has a very high risk for readmission.   [name] 04/02/2019 2:58 PM

## 2019-04-02 NOTE — Progress Notes (Addendum)
SATURATION QUALIFICATIONS: (This note is used to comply with regulatory documentation for home oxygen)  Patient Saturations on Room Air at Rest =87%   Patient Saturations on4 Liters of oxygen while Ambulating = 91%  Please briefly explain why patient needs home oxygen: unable to maintain 02 sat above 89% without supplemental 02.

## 2019-04-02 NOTE — Discharge Instructions (Signed)
° ° °  Supplemental Discharge Instructions for  Pacemaker/Defibrillator Patients  Activity No heavy lifting or vigorous activity with your left/right arm for 6 to 8 weeks.  Do not raise your left/right arm above your head for one week.  Gradually raise your affected arm as drawn below.           __   04/05/19                      04/06/19                    04/07/19                          04/08/19  NO DRIVING for   1 week  ; you may begin driving on    10/12/94 .  WOUND CARE - Keep the wound area clean and dry.  Do not get this area wet for one week. No showers for one week; you may shower on  04/08/19   . - The tape/steri-strips on your wound will fall off; do not pull them off.  No bandage is needed on the site.  DO  NOT apply any creams, oils, or ointments to the wound area. - If you notice any drainage or discharge from the wound, any swelling or bruising at the site, or you develop a fever > 101? F after you are discharged home, call the office at once.  Special Instructions - You are still able to use cellular telephones; use the ear opposite the side where you have your pacemaker/defibrillator.  Avoid carrying your cellular phone near your device. - When traveling through airports, show security personnel your identification card to avoid being screened in the metal detectors.  Ask the security personnel to use the hand wand. - Avoid arc welding equipment,  TENS units (transcutaneous nerve stimulators).  Call the office for questions about other devices. - Avoid electrical appliances that are in poor condition or are not properly grounded. - Microwave ovens are safe to be near or to operate.  Additional information for defibrillator patients should your device go off: - If your device goes off ONCE and you feel fine afterward, notify the device clinic nurses. - If your device goes off ONCE and you do not feel well afterward, call 911. - If your device goes off TWICE, call 911. - If your  device goes off THREE times in one day, call 911.  DO NOT DRIVE YOURSELF OR A FAMILY MEMBER WITH A DEFIBRILLATOR TO THE HOSPITAL--CALL 911.

## 2019-04-02 NOTE — Progress Notes (Signed)
RT placed pt on CPAP dream station on her home setting of [place] with 4Lpm bled into the system. Pt respiratory status is stable at this time. RT will continue to monitor.

## 2019-04-02 NOTE — TOC Transition Note (Signed)
Transition of Care [facility]) - CM/SW Discharge Note   Patient Details  Name: [name] MRN: [id number] Date of Birth: [birth year]  Transition of Care [facility]) CM/SW Contact:  [name], RN Phone Number: 04/02/2019, 3:43 PM   Clinical Narrative: Pt presented for Acute on Chronic CHF- Previously active with [facility] for RN, PT-resumption orders received. [facility] aware that patient transitioning home today. DME 02 portable E-tank to be delivered to room prior to transition home. Patient had an increase in liter flow. Daughter to provide transportation home. No further needs from CM at this time.       Final next level of care: Home w Home Health Services Barriers to Discharge: Barriers Resolved   Patient Goals and CMS Choice     Choice offered to / list presented to : Patient(Active with [facility])  Discharge Placement                       Discharge Plan and Services In-house Referral: [facility](following outpatient.) Discharge Planning Services: CM Consult, HF Clinic Post Acute Care Choice: [place], Durable Medical Equipment          DME Arranged: Oxygen DME Agency: [employer] Date DME Agency Contacted: 04/02/19 Time DME Agency Contacted: [phone] Representative spoke with at DME Agency: [place]: RN, Disease Management, PT [place] Agency: [place] ([place]) Date [place]: 04/02/19 Time [place]: 1542 Representative spoke with at [place]: [place] ([place]) Interventions     Readmission Risk Interventions Readmission Risk Prevention Plan 04/02/2019 02/18/2019  Transportation Screening Complete Complete  Medication Review Press photographer) Complete Complete  PCP or Specialist appointment within 3-5 days of discharge Complete Complete  HRI or [place] Complete Complete  SW Recovery Care/Counseling Consult Patient refused Patient refused  Palliative Care Screening Not Applicable Not [place] Not Applicable Not Applicable  Some recent data might be hidden

## 2019-04-02 NOTE — Discharge Summary (Addendum)
Advanced Heart Failure Team  Discharge Summary   Patient ID: CHAMILLE [name] MRN: [id number], DOB/AGE: December 06, 1955 63 y.o. Admit date: 03/23/2019 D/C date:     04/02/2019   Primary Discharge Diagnoses:  1. 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 Acute on chronic CKD III 3. PAT & NSVT, frequent PVCs 4. CAD 5. DM2 6. H/o PAD 7. Morbid obesity 8. Hypokalemia 9. S/P Medtronic CRT-D   Hospital Course:   [name] is a 63 y.o. 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   Admitted on 6/16.20 with A/C biventricular HF. EF improved previously with CRT but now back down again after CRT removal due to sepsis.She has marked volume overload on exam and rising creatinine. Possibly low output. Will start with IV lasix 120 IV bid and metolazone.   Unfortunately while hospitalized her husband had CVA and passed away on 04/05/23.  She is being discharged today with volume overload and worsening renal function, but she is anxious to go home and plan her husbands funeral.   Discharge home with [facility] to follow. Check BMET on Monday. She will need continuous oxygen. She is aware that she may decompensate and need to be re-hospitalization. We have set up follow up in the HF clinic next week. .   1. 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  NICM with LBBB (114ms) - Echo 6/15 EF 10-15% (down from 40-45% prior to removal of CRT) - RHC on 6/22 with elevated filling pressures. IV lasix restarted - With CKD and severe biventricular failure - this may be as good as we can get.  - Diuresed with IV lasix + metolazone with  addition of milrinone. Creatinine went up so diuretics held on the day discharge and she will restart torsemide tomorrow night. - No ACE/ARB with AKI. No b-blocker with ADHF. NO room for hydralazine/nitrate.  - s/p CRT-D replacement 6/25 - She remains very tenuous with biventricular failure Hopefully EF & HF will improve with CRT. She has been having frequent PVCs too and now being suppressed with amio so hopefully this may help as well  2. Acute on chronic CKD III - Creatinine 2.6>3.5 . Check BMET on Monday.   3. PAT & NSVT, frequent PVCs - recurrent. - Suppressed with IV amio. No need for [facility] for atrial tach.  - Per EPamiodarone 400mg  bid x2 weeks then 200mg  bid x2 weeks then 200mg  daily.  4. CAD - no s/s ischemia   - last cath in 12/19 with severe LAD mid to distal LAD disease after previous stent. No PCI options - continue statin and DAPT  5. DM2 - Lantus adjusted and now improved - cover with SSI - HgBa1c 6.7  6. H/o PAD - s/p bilateral toe amputations due to non-healing wounds  7. Morbid obesity  8. Hypokalemia - Stable.   Discharge Vitals: Blood pressure 122/62, pulse 96, temperature 98 F (36.7 C), temperature source Oral, resp. rate 18, height 5\' 8"  (1.727 m), weight 130.6 kg, SpO2 99 %.  Labs: Lab Results  Component Value Date   WBC 5.0 03/30/2019  HGB 11.1 (L) 03/30/2019   HCT 34.2 (L) 03/30/2019   MCV 83.6 03/30/2019   PLT 143 (L) 03/30/2019    Recent Labs  Lab 04/02/19 0357  NA 131*  K 3.8  CL 89*  CO2 27  BUN 108*  CREATININE 3.57*  CALCIUM 8.7*  GLUCOSE 215*   Lab Results  Component Value Date   CHOL 82 (L) 02/03/2019   HDL 37 (L) 02/03/2019   LDLCALC 25 02/03/2019   TRIG 101 02/03/2019   BNP (last 3 results) Recent Labs    01/22/19 0929 01/25/19 1603 02/16/19 1115  BNP 1,208.0* 971.0* 1,289.0*    ProBNP (last 3 results) No results for input(s): PROBNP in the last 8760 hours.   Diagnostic Studies/Procedures   Dg  Chest 2 View  Result Date: 04/01/2019 CLINICAL DATA:  Pacemaker placement EXAM: CHEST - 2 VIEW COMPARISON:  March 09, 2019 FINDINGS: There is no a pacemaker present on the left. Lead tips are attached to the right atrium, right ventricle, and coronary sinus. No pneumothorax. There is cardiomegaly with pulmonary venous hypertension. There is a lentiform shaped opacity in the right mid lung which may represent fluid tracking along the minor fissure. There is also apparent airspace consolidation more posteriorly posterior segment right upper lobe. There may be loculated effusion in this area as well. There is mild interstitial pulmonary edema. There is aortic atherosclerosis. No adenopathy appreciable. No bone lesions. IMPRESSION: 1. Pacemaker lead tips attached to right atrium, right ventricle, and coronary sinus. No pneumothorax. 2. Pulmonary vascular congestion with interstitial edema. Probable loculated pleural effusion along the right minor fissure. Airspace opacity more posteriorly in the right upper lobe may in part be due to effusion, although suspect superimposed airspace consolidation, likely pneumonia. Noncontrast enhanced CT may be helpful to further assess in this regard. 3.  Aortic Atherosclerosis (ICD10-I70.0). Electronically Signed   By: Lowella Grip III M.D.   On: 04/01/2019 09:34    Discharge Medications   Allergies as of 04/02/2019   No Known Allergies     Medication List    STOP taking these medications   glimepiride 4 MG tablet Commonly known as: AMARYL   metolazone 5 MG tablet Commonly known as: ZAROXOLYN   metoprolol tartrate 25 MG tablet Commonly known as: LOPRESSOR     TAKE these medications   amiodarone 200 MG tablet Commonly known as: Pacerone Take 400 mg twice a day x 7 days then 200 mg twice a day then 200 mg daiy   aspirin 81 MG EC tablet Take 1 tablet (81 mg total) by mouth daily.   Calcium 600-D 600-400 MG-UNIT Tabs Generic drug: Calcium  Carbonate-Vitamin D3 Take 1 tablet by mouth 2 (two) times a day.   clopidogrel 75 MG tablet Commonly known as: PLAVIX Take 1 tablet (75 mg total) by mouth daily.   docusate sodium 100 MG capsule Commonly known as: COLACE Take 1 capsule (100 mg total) by mouth 2 (two) times daily.   ferrous sulfate 325 (65 FE) MG tablet Take 325 mg by mouth 2 (two) times daily with a meal.   Fish Oil 1000 MG Caps Take 1 capsule by mouth 2 (two) times a day.   gabapentin 600 MG tablet Commonly known as: NEURONTIN Take 1 tablet (600 mg total) by mouth 2 (two) times daily. Takes 1 in the morning and two at bedtime What changed: additional instructions   glucose blood test strip Commonly known as: ONE TOUCH ULTRA TEST USE AS DIRECTED TO CHECK BLOOD  SUGAR 3 TIMES DAILY   insulin glargine 100 UNIT/ML injection Commonly known as: LANTUS Inject 0.16 mLs (16 Units total) into the skin at bedtime. What changed: how much to take   multivitamin tablet Take 1 tablet by mouth daily.   mupirocin ointment 2 % Commonly known as: BACTROBAN Place 1 application into the nose 2 (two) times daily for 4 days.   potassium chloride 10 MEQ tablet Commonly known as: K-DUR Take 1 tablet (10 meq) by mouth once daily   rosuvastatin 40 MG tablet Commonly known as: CRESTOR TAKE 1 TABLET BY MOUTH ONCE DAILY   torsemide 20 MG tablet Commonly known as: DEMADEX Take 2 tablets (40 mg total) by mouth 2 (two) times daily. Take 2 tablets (40 mg) by mouth twice daily Start taking on: April 03, 2019 What changed:   how much to take  how to take this  when to take this   traMADol 50 MG tablet Commonly known as: ULTRAM Take 1 tablet (50 mg total) by mouth every 6 (six) hours as needed. What changed: reasons to take this            Durable Medical Equipment  (From admission, onward)         Start     Ordered   04/02/19 1448  For home use only DME oxygen  Once    Comments: Room AIr oxygen saturation 877%   On 4 liters O2 sats 92%.  With exertion on 4 iters 91%.  Question Answer Comment  Length of Need Lifetime   Mode or (Route) Nasal cannula   Liters per Minute 4   Frequency Continuous (stationary and portable oxygen unit needed)   Oxygen conserving device Yes   Oxygen delivery system Gas      04/02/19 1450   04/02/19 1341  Heart failure home health orders  (Heart failure home health orders / Face to face)  Once    Comments: Heart Failure Follow-up Care:  Verify follow-up appointments per Patient Discharge Instructions. Confirm transportation arranged. Reconcile home medications with discharge medication list. Remove discontinued medications from use. Assist patient/caregiver to manage medications using pill box. Reinforce low sodium food selection Assessments: Vital signs and oxygen saturation at each visit. Assess home environment for safety concerns, caregiver support and availability of low-sodium foods. Consult Education officer, museum, PT/OT, Dietitian, and CNA based on assessments. Perform comprehensive cardiopulmonary assessment. Notify MD for any change in condition or weight gain of 3 pounds in one day or 5 pounds in one week with symptoms. Daily Weights and Symptom Monitoring: Ensure patient has access to scales. Teach patient/caregiver to weigh daily before breakfast and after voiding using same scale and record.    Teach patient/caregiver to track weight and symptoms and when to notify Provider. Activity: Develop individualized activity plan with patient/caregiver. BMET on 04/05/2019  Question Answer Comment  Heart Failure Follow-up Care Advanced Heart Failure (AHF) Clinic at [phone]   Obtain the following labs Basic Metabolic Panel   Lab frequency Other see comments   Fax lab results to AHF Clinic at [phone]   Diet Low Sodium Heart Healthy   Fluid restrictions: 2000 mL Fluid      04/02/19 1341          Disposition   The patient will be discharged in stable  condition to home. Discharge Instructions    (HEART FAILURE PATIENTS) Call MD:  Anytime you have any of the following symptoms: 1) 3 pound weight gain in 24 hours or 5 pounds  in 1 week 2) shortness of breath, with or without a dry hacking cough 3) swelling in the hands, feet or stomach 4) if you have to sleep on extra pillows at night in order to breathe.   Complete by: As directed    Diet - low sodium heart healthy   Complete by: As directed    Heart Failure patients record your daily weight using the same scale at the same time of day   Complete by: As directed    Increase activity slowly   Complete by: As directed    STOP any activity that causes chest pain, shortness of breath, dizziness, sweating, or exessive weakness   Complete by: As directed      Follow-up Information    [facility] Follow up on 04/13/2019.   Why: at 1200 for device wound check. Contact information: [place] [address] [phone]       Bensimhon, Shaune Pascal, MD. Go on 04/07/2019.   Specialty: Cardiology Why: at 2:00 [place]. Darrick Grinder NP will see you.  Contact information: [place] [address] [phone]             Duration of Discharge Encounter: Greater than 35 minutes   Signed, Darrick Grinder  04/02/2019, 3:00 PM

## 2019-04-03 DIAGNOSIS — I130 Hypertensive heart and chronic kidney disease with heart failure and stage 1 through stage 4 chronic kidney disease, or unspecified chronic kidney disease: Secondary | ICD-10-CM | POA: Diagnosis not present

## 2019-04-03 DIAGNOSIS — I255 Ischemic cardiomyopathy: Secondary | ICD-10-CM | POA: Diagnosis not present

## 2019-04-03 DIAGNOSIS — I447 Left bundle-branch block, unspecified: Secondary | ICD-10-CM | POA: Diagnosis not present

## 2019-04-03 DIAGNOSIS — D509 Iron deficiency anemia, unspecified: Secondary | ICD-10-CM | POA: Diagnosis not present

## 2019-04-03 DIAGNOSIS — Z87440 Personal history of urinary (tract) infections: Secondary | ICD-10-CM | POA: Diagnosis not present

## 2019-04-03 DIAGNOSIS — F329 Major depressive disorder, single episode, unspecified: Secondary | ICD-10-CM | POA: Diagnosis not present

## 2019-04-03 DIAGNOSIS — Z48812 Encounter for surgical aftercare following surgery on the circulatory system: Secondary | ICD-10-CM | POA: Diagnosis not present

## 2019-04-03 DIAGNOSIS — Z955 Presence of coronary angioplasty implant and graft: Secondary | ICD-10-CM | POA: Diagnosis not present

## 2019-04-03 DIAGNOSIS — Z89422 Acquired absence of other left toe(s): Secondary | ICD-10-CM | POA: Diagnosis not present

## 2019-04-03 DIAGNOSIS — Z5181 Encounter for therapeutic drug level monitoring: Secondary | ICD-10-CM | POA: Diagnosis not present

## 2019-04-03 DIAGNOSIS — E1142 Type 2 diabetes mellitus with diabetic polyneuropathy: Secondary | ICD-10-CM | POA: Diagnosis not present

## 2019-04-03 DIAGNOSIS — Z950 Presence of cardiac pacemaker: Secondary | ICD-10-CM | POA: Diagnosis not present

## 2019-04-03 DIAGNOSIS — Z89421 Acquired absence of other right toe(s): Secondary | ICD-10-CM | POA: Diagnosis not present

## 2019-04-03 DIAGNOSIS — Z8631 Personal history of diabetic foot ulcer: Secondary | ICD-10-CM | POA: Diagnosis not present

## 2019-04-03 DIAGNOSIS — I252 Old myocardial infarction: Secondary | ICD-10-CM | POA: Diagnosis not present

## 2019-04-03 DIAGNOSIS — E1122 Type 2 diabetes mellitus with diabetic chronic kidney disease: Secondary | ICD-10-CM | POA: Diagnosis not present

## 2019-04-03 DIAGNOSIS — Z95820 Peripheral vascular angioplasty status with implants and grafts: Secondary | ICD-10-CM | POA: Diagnosis not present

## 2019-04-03 DIAGNOSIS — E559 Vitamin D deficiency, unspecified: Secondary | ICD-10-CM | POA: Diagnosis not present

## 2019-04-03 DIAGNOSIS — N189 Chronic kidney disease, unspecified: Secondary | ICD-10-CM | POA: Diagnosis not present

## 2019-04-03 DIAGNOSIS — I5023 Acute on chronic systolic (congestive) heart failure: Secondary | ICD-10-CM | POA: Diagnosis not present

## 2019-04-03 DIAGNOSIS — I2510 Atherosclerotic heart disease of native coronary artery without angina pectoris: Secondary | ICD-10-CM | POA: Diagnosis not present

## 2019-04-03 DIAGNOSIS — R238 Other skin changes: Secondary | ICD-10-CM | POA: Diagnosis not present

## 2019-04-03 DIAGNOSIS — G4733 Obstructive sleep apnea (adult) (pediatric): Secondary | ICD-10-CM | POA: Diagnosis not present

## 2019-04-03 DIAGNOSIS — I2690 Septic pulmonary embolism without acute cor pulmonale: Secondary | ICD-10-CM | POA: Diagnosis not present

## 2019-04-05 ENCOUNTER — Telehealth: Payer: PPO | Admitting: Family Medicine

## 2019-04-05 ENCOUNTER — Telehealth (HOSPITAL_COMMUNITY): Payer: Self-pay

## 2019-04-05 ENCOUNTER — Telehealth: Payer: Self-pay | Admitting: Family Medicine

## 2019-04-05 ENCOUNTER — Telehealth: Payer: Self-pay | Admitting: Cardiovascular Disease

## 2019-04-05 DIAGNOSIS — N189 Chronic kidney disease, unspecified: Secondary | ICD-10-CM | POA: Diagnosis not present

## 2019-04-05 DIAGNOSIS — J189 Pneumonia, unspecified organism: Secondary | ICD-10-CM | POA: Diagnosis not present

## 2019-04-05 DIAGNOSIS — M86171 Other acute osteomyelitis, right ankle and foot: Secondary | ICD-10-CM | POA: Diagnosis not present

## 2019-04-05 DIAGNOSIS — G4733 Obstructive sleep apnea (adult) (pediatric): Secondary | ICD-10-CM | POA: Diagnosis not present

## 2019-04-05 DIAGNOSIS — I5022 Chronic systolic (congestive) heart failure: Secondary | ICD-10-CM | POA: Diagnosis not present

## 2019-04-05 NOTE — Telephone Encounter (Signed)
Spoke with patient and provided emotional support to her regarding her husband's passing. She states that he had been sick with sinus problems and didn't realize how sick he was. She states that she was able to facetime with him prior to her surgery and then when she came out he had died 30 minutes earlier. Expressed our sympathies for her loss and to please let us know if she has any problems or questions. She was very appreciative for the call and follow up.

## 2019-04-05 NOTE — Telephone Encounter (Signed)
04/05/2019 - PATIENT HAD A VIRTUAL VISIT WITH DR. Benay Spice TODAY (04/05/2019) AT 4:20pm. I LEFT HER A VOICE MAIL TO RETURN MY CALL TO RESCHEDULE WITH DR. Prescott Parma. PATIENT DID CALL BACK AND SPOKE WITH BRANDI AND WAS RESCHEDULED. [place]

## 2019-04-05 NOTE — Telephone Encounter (Signed)
Contacted [name] today to make appt to meet her and see if interested in [employer].  She advised her husband died while she was in hospital and she has the funeral this week.  Will contact her Monday next week to visit.  She is interested just overwhelmed at the moment.  Advised her to save my number and if she has any problems she may contact me.  She states been doing ok since home.   [place] [phone]

## 2019-04-07 ENCOUNTER — Encounter: Payer: Self-pay | Admitting: Physician Assistant

## 2019-04-07 ENCOUNTER — Encounter (HOSPITAL_COMMUNITY): Payer: Self-pay

## 2019-04-07 ENCOUNTER — Ambulatory Visit (HOSPITAL_COMMUNITY)
Admit: 2019-04-07 | Discharge: 2019-04-07 | Disposition: A | Discharge status: Home / Self Care | Payer: PPO | Source: Ambulatory Visit | Attending: Cardiology | Admitting: Cardiology

## 2019-04-07 ENCOUNTER — Telehealth: Payer: Self-pay | Admitting: Nurse Practitioner

## 2019-04-07 ENCOUNTER — Other Ambulatory Visit: Payer: Self-pay

## 2019-04-07 VITALS — BP 126/70 | HR 82 | Wt 282.2 lb

## 2019-04-07 DIAGNOSIS — Z9981 Dependence on supplemental oxygen: Secondary | ICD-10-CM | POA: Diagnosis not present

## 2019-04-07 DIAGNOSIS — E7800 Pure hypercholesterolemia, unspecified: Secondary | ICD-10-CM | POA: Diagnosis not present

## 2019-04-07 DIAGNOSIS — Z6841 Body Mass Index (BMI) 40.0 and over, adult: Secondary | ICD-10-CM | POA: Insufficient documentation

## 2019-04-07 DIAGNOSIS — Z8261 Family history of arthritis: Secondary | ICD-10-CM | POA: Diagnosis not present

## 2019-04-07 DIAGNOSIS — E1122 Type 2 diabetes mellitus with diabetic chronic kidney disease: Secondary | ICD-10-CM | POA: Insufficient documentation

## 2019-04-07 DIAGNOSIS — Z79899 Other long term (current) drug therapy: Secondary | ICD-10-CM | POA: Insufficient documentation

## 2019-04-07 DIAGNOSIS — F329 Major depressive disorder, single episode, unspecified: Secondary | ICD-10-CM | POA: Insufficient documentation

## 2019-04-07 DIAGNOSIS — I5042 Chronic combined systolic (congestive) and diastolic (congestive) heart failure: Secondary | ICD-10-CM | POA: Diagnosis not present

## 2019-04-07 DIAGNOSIS — I2510 Atherosclerotic heart disease of native coronary artery without angina pectoris: Secondary | ICD-10-CM | POA: Diagnosis not present

## 2019-04-07 DIAGNOSIS — G4733 Obstructive sleep apnea (adult) (pediatric): Secondary | ICD-10-CM | POA: Diagnosis not present

## 2019-04-07 DIAGNOSIS — N183 Chronic kidney disease, stage 3 unspecified: Secondary | ICD-10-CM

## 2019-04-07 DIAGNOSIS — Z8249 Family history of ischemic heart disease and other diseases of the circulatory system: Secondary | ICD-10-CM | POA: Insufficient documentation

## 2019-04-07 DIAGNOSIS — Z95810 Presence of automatic (implantable) cardiac defibrillator: Secondary | ICD-10-CM | POA: Diagnosis not present

## 2019-04-07 DIAGNOSIS — Z7982 Long term (current) use of aspirin: Secondary | ICD-10-CM | POA: Insufficient documentation

## 2019-04-07 DIAGNOSIS — I480 Paroxysmal atrial fibrillation: Secondary | ICD-10-CM | POA: Diagnosis not present

## 2019-04-07 DIAGNOSIS — I252 Old myocardial infarction: Secondary | ICD-10-CM | POA: Insufficient documentation

## 2019-04-07 DIAGNOSIS — E559 Vitamin D deficiency, unspecified: Secondary | ICD-10-CM | POA: Insufficient documentation

## 2019-04-07 DIAGNOSIS — I472 Ventricular tachycardia: Secondary | ICD-10-CM | POA: Insufficient documentation

## 2019-04-07 DIAGNOSIS — I493 Ventricular premature depolarization: Secondary | ICD-10-CM | POA: Diagnosis not present

## 2019-04-07 DIAGNOSIS — I130 Hypertensive heart and chronic kidney disease with heart failure and stage 1 through stage 4 chronic kidney disease, or unspecified chronic kidney disease: Secondary | ICD-10-CM | POA: Insufficient documentation

## 2019-04-07 DIAGNOSIS — Z833 Family history of diabetes mellitus: Secondary | ICD-10-CM | POA: Diagnosis not present

## 2019-04-07 DIAGNOSIS — I428 Other cardiomyopathies: Secondary | ICD-10-CM

## 2019-04-07 DIAGNOSIS — E1142 Type 2 diabetes mellitus with diabetic polyneuropathy: Secondary | ICD-10-CM | POA: Insufficient documentation

## 2019-04-07 DIAGNOSIS — I5022 Chronic systolic (congestive) heart failure: Secondary | ICD-10-CM | POA: Diagnosis not present

## 2019-04-07 DIAGNOSIS — Z89422 Acquired absence of other left toe(s): Secondary | ICD-10-CM | POA: Diagnosis not present

## 2019-04-07 DIAGNOSIS — Z89421 Acquired absence of other right toe(s): Secondary | ICD-10-CM | POA: Insufficient documentation

## 2019-04-07 DIAGNOSIS — Z794 Long term (current) use of insulin: Secondary | ICD-10-CM | POA: Insufficient documentation

## 2019-04-07 LAB — BASIC METABOLIC PANEL
Anion gap: 14 (ref 5–15)
BUN: 98 mg/dL — ABNORMAL HIGH (ref 8–23)
CO2: 32 mmol/L (ref 22–32)
Calcium: 9.6 mg/dL (ref 8.9–10.3)
Chloride: 93 mmol/L — ABNORMAL LOW (ref 98–111)
Creatinine, Ser: 3.39 mg/dL — ABNORMAL HIGH (ref 0.44–1.00)
GFR calc Af Amer: 16 mL/min — ABNORMAL LOW (ref >60)
GFR calc non Af Amer: 14 mL/min — ABNORMAL LOW (ref >60)
Glucose, Bld: 142 mg/dL — ABNORMAL HIGH (ref 70–99)
Potassium: 3 mmol/L — ABNORMAL LOW (ref 3.5–5.1)
Sodium: 139 mmol/L (ref 135–145)

## 2019-04-07 MED ORDER — TORSEMIDE 20 MG PO TABS: 60.0000 mg | ORAL_TABLET | Freq: Two times a day (BID) | ORAL | 3 refills | Status: DC

## 2019-04-07 NOTE — Telephone Encounter (Signed)
Patient just recently discharged home from hospital, called patient to schedule a Palliative f/u visit.  Patient requested that I call her back on 04/09/19 so that she can get her appointments together and write them on her calendar so that she will know we can schedule f/u visit.  Will call back on Friday.

## 2019-04-07 NOTE — Progress Notes (Signed)
PCP: Primary Cardiologist: Dr Haroldine Laws  HPI: Vickie Hopes Gerringeris a 63 y.o.femalewith MI>>LAD DES,LBBB, mixedNICM/ICM combinedsystolic and diastolic CHF,status post CRT-D 2015 with improvement in EF to 40 to 45%>>10-15% after ICD removed 2nd bacteremia,, DM 2, HTN, obesity,PAD w/ PTA L anterior tibial artery 10/06/2018,OSA prescribed CPAP, NSTEMI 12/2019s/p cath w/ med rx for CAD>>2/2 MSSA bacteremia s/p toe amputation, TV and lead vegetations, s/p Angiovac &ICD removal at [facility] .   Admitted on 03/23/19 with A/C biventricular HF. EF improved previously with CRT but now back down again after CRT removal due to sepsis.She had marked volume overload on exam and rising creatinine. Possibly low output. Started on IV lasix + metolazone.Placed on milrinone which was later weaned off. Unfortunately while hospitalized her husband had CVA and passed away on 2023/04/13. On April 13, 2023 she underwent CRT-D.  She is being discharged today with volume overload and worsening renal function, but she is anxious to go home and plan her husbands funeral. Discharge weight 287 pounds. Discharge with [facility].   Today she presented for post hospital follow up. Overall feeling a little stronger. Using oxygen continuously. She was able to arrange and attend her husbands funeral on Monday. Remains SOB with exertion but getting a little better.  Denies PND/Orthopnea. Appetite ok. No fever or chills. Weight at home has trended down to 293-->282 pounds. Ongoing leg edema. Taking all medications. Followed by [facility]    ROS: All systems negative except as listed in HPI, PMH and Problem List.  SH:  Social History   Socioeconomic History  . Marital status: Married    Spouse name: [name]  . Number of children: 2  . Years of education: 9  . Highest education level: High school graduate  Occupational History  . Occupation: disabled    Fish farm manager: UNEMPLOYED    Comment: 03/2010 for peripheral neuropathy  . 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  Social Needs  . Financial resource strain: Not hard at all  . Food insecurity    Worry: Never true    Inability: Never true  . Transportation needs    Medical: No    Non-medical: No  Tobacco Use  . Smoking status: Never Smoker  . Smokeless tobacco: Never Used  Substance and Sexual Activity  . Alcohol use: No    Alcohol/week: 0.0 standard drinks  . Drug use: No  . Sexual activity: Never    Birth control/protection: Post-menopausal, Surgical  Lifestyle  . Physical activity    Days per week: 0 days    Minutes per session: 0 min  . Stress: Rather much  Relationships  . Social connections    Talks on phone: More than three times a week    Gets together: More than three times a week    Attends religious service: Never    Active member of club or organization: No    Attends meetings of clubs or organizations: Never    Relationship status: Married  . Intimate partner violence    Fear of current or ex partner: No    Emotionally abused: No    Physically abused: No    Forced sexual activity: No  Other Topics Concern  . Not on file  Social History Narrative   Marital status:Married x 45 yrs. Happily married, no abuse.       Children:  2 children (daughter 66, son 40). Two grandsons and 2 step grandchildren.  1 gg.      Lives: with husband, daughter, son-in-law, 2 grandsons, and 1  on the way.      Employment: disability for peripheral neuropathy 2012.  Previously had home daycare.      Tobacco: none      Alcohol: none      Drugs: none      Exercise: none. [employer].      Pets: dog.      Always uses seat belts, smoke detectors in home.      No guns in the home.       Caffeine use: 2 cups coffee per day.       Nutrition: Well balanced diet.    FH:  Family History  Problem Relation Age of Onset  . Diabetes Mother   . Hypertension Mother   . Arthritis Mother        knees, lumbar DDD, cervical DDD  . Cancer Father         prostate,skin,lymphoma.  . Cancer Brother 26       bladder cancer; non-smoker  . Diabetes Maternal Grandmother   . Heart disease Maternal Grandmother   . COPD Maternal Grandmother   . Diabetes Paternal Grandmother   . Obesity Brother   . Diabetes Son   . Hypertension Son   . Cancer Maternal Grandfather   . Diabetes Paternal Grandfather     Past Medical History:  Diagnosis Date  . Acute myocardial infarction, subendocardial infarction, initial episode of care ([place]) 07/21/2012  . Acute osteomyelitis involving ankle and foot ([place]) 02/06/2015  . Acute systolic heart failure ([place]) 07/21/2012   New onset 07/19/12; admission to [facility] ED. Elevated Troponins.  S/p 2D-echo with EF 20-25%.  S/p cardiac catheterization with stenting LAD.  Repeat 2D-echo 10/2011 with improved EF of 35%.   . Anemia   . Automatic implantable cardioverter-defibrillator in situ    a. MDT CRT-D 06/2014, SN: [id number]  .removed in feb 2020  . CAD (coronary artery disease)    a. cardiac cath 101/04/2012: PCI/DES to chronically occluded mLAD, consideration PCI to diag branch in 4 weeks.   . Cataract   . Chicken pox   . Chronic kidney disease   . Chronic systolic CHF (congestive heart failure) (HCC)    a. mixed ICM & NICM; b. EF 20-25% by echo 07/2012, mid-dist 2/3 of LV sev HK/AK, mild MR. echo 10/2012: EF 30-35%, sev HK ant-septal & inf walls, GR1DD, mild MR, PASP 33. c. echo 02/2013: EF 30%, GR1DD, mild MR. echo 04/2014: EF 30%, Septal-lat dyssynchrony, global HK, inf AK, GR1DD, mild MR. d. echo 10/2014: EF50-55%, WM nl, GR1DD, septal mild paradox. e. echo 02/2015: EF 50-55%, wm   . Depression   . Heart attack ([place])   . Heel ulcer ([place]) 04/27/2015  . History of blood transfusion ~ 2011   "plasma; had neuropathy; couldn't walk"  . Hypertension   . LBBB (left bundle branch block)   . Neuromuscular disorder ([place])   . Neuropathy 2011  . Obesity, unspecified   . OSA on CPAP    Moderate with AHI 23/hr and now on CPAP at  16cm H2O.  Her DME is [facility]  . Pure hypercholesterolemia   . Sepsis ([place]) 09/2018  . Type II diabetes mellitus ([place])   . Unspecified vitamin D deficiency     Current Outpatient Medications  Medication Sig Dispense Refill  . amiodarone (PACERONE) 200 MG tablet Take 400 mg twice a day x 7 days then 200 mg twice a day then 200 mg daiy 80 tablet 6  . aspirin EC 81  MG EC tablet Take 1 tablet (81 mg total) by mouth daily. 30 tablet 0  . Calcium Carbonate-Vitamin D3 (CALCIUM 600-D) 600-400 MG-UNIT TABS Take 1 tablet by mouth 2 (two) times a day.    . clopidogrel (PLAVIX) 75 MG tablet Take 1 tablet (75 mg total) by mouth daily. 90 tablet 0  . docusate sodium (COLACE) 100 MG capsule Take 1 capsule (100 mg total) by mouth 2 (two) times daily. 60 capsule 3  . ferrous sulfate 325 (65 FE) MG tablet Take 325 mg by mouth 2 (two) times daily with a meal.    . gabapentin (NEURONTIN) 600 MG tablet Take 1 tablet (600 mg total) by mouth 2 (two) times daily. Takes 1 in the morning and two at bedtime (Patient taking differently: Take 600 mg by mouth 2 (two) times daily. Take 1 tablet (600mg ) by mouth every morning and take 2 tablets (1200mg ) by mouth at bedtime) 180 tablet 1  . glucose blood (ONE TOUCH ULTRA TEST) test strip USE AS DIRECTED TO CHECK BLOOD SUGAR 3 TIMES DAILY 300 each 11  . insulin glargine (LANTUS) 100 UNIT/ML injection Inject 0.16 mLs (16 Units total) into the skin at bedtime. (Patient taking differently: Inject 14 Units into the skin at bedtime. ) 10 mL 11  . Multiple Vitamin (MULTIVITAMIN) tablet Take 1 tablet by mouth daily.    . Omega-3 Fatty Acids (FISH OIL) 1000 MG CAPS Take 1 capsule by mouth 2 (two) times a day.     . potassium chloride (K-DUR) 10 MEQ tablet Take 1 tablet (10 meq) by mouth once daily    . rosuvastatin (CRESTOR) 40 MG tablet TAKE 1 TABLET BY MOUTH ONCE DAILY (Patient taking differently: Take 40 mg by mouth daily. ) 30 tablet 0  . torsemide (DEMADEX) 20 MG tablet Take 2 tablets  (40 mg total) by mouth 2 (two) times daily. Take 2 tablets (40 mg) by mouth twice daily 120 tablet 6  . traMADol (ULTRAM) 50 MG tablet Take 1 tablet (50 mg total) by mouth every 6 (six) hours as needed. (Patient taking differently: Take 50 mg by mouth every 6 (six) hours as needed for moderate pain. ) 120 tablet 5   No current facility-administered medications for this encounter.     Vitals:   04/07/19 1413  BP: 126/70  Pulse: 82  SpO2: 100%  Weight: 128 kg (282 lb 3.2 oz)   Wt Readings from Last 3 Encounters:  04/07/19 128 kg (282 lb 3.2 oz)  04/02/19 130.6 kg (287 lb 14.7 oz)  03/23/19 132.9 kg (293 lb)    PHYSICAL EXAM:  General:  Appears chronically ill. No resp difficulty. Arrived in wheel chair.  HEENT: normal Neck: supple. JVP 9-10. Carotids 2+ bilaterally; no bruits. No lymphadenopathy or thryomegaly appreciated. Cor: PMI normal. Regular rate & rhythm. No rubs, gallops or murmurs. L upper chest steri strips. Ecchymotic Lungs: clear on 2 liters oxygen.  Abdomen: soft, nontender, nondistended. No hepatosplenomegaly. No bruits or masses. Good bowel sounds. Extremities: no cyanosis, clubbing, rash, R and LLE 2+ edema Neuro: alert & orientedx3, cranial nerves grossly intact. Moves all 4 extremities w/o difficulty. Affect pleasant.   ASSESSMENT & PLAN: 1.  Chronic CHF due to mixed ICM. NICM with LBBB (164ms) - Echo 03/22/19 EF 10-15% (down from 40-45% prior to removal of CRT) - RHC on 6/22 with elevated filling pressures. - s/p CRT-D replacement 6/25 - NYHA III. Volume status elevated but weight trending down since discharge.  Increase torsemide to 60  mg twice a day.  - No bb for now.  - No spiro, dig, or arb with CKD.    2. Chronic CKD III -Check BMET today.  3. PAT & NSVT, frequent PVCs -Continue amiodarone taper per EP.   4. CAD No chest pain.  - last cath in 12/19 with severe LAD mid to distal LAD disease after previous stent. No PCI options - continue  statin and DAPT  5. DM2 - Lantus adjusted and now improved - cover with SSI - HgBa1c 6.7  6. H/o PAD - s/p bilateral toe amputations due to non-healing wounds  7. Morbid obesity Discussed portion control.   8. S/P CRT-D Medtronic  I contacted EP and they were able to assess site and device. She will follow up with device clinic in 7 days.   Follow up next week with Dr Haroldine Laws. Check BMET today. Greater than 50% of the (total minutes 25 ) visit spent in counseling/coordination of care regarding the above.     Amy Clegg  Np-C 4:15 PM

## 2019-04-07 NOTE — Patient Instructions (Signed)
Labs were done today. We will call you with any ABNORMAL results. No news is good news!  INCREASE Torsemide to 60 mg (3 tabs) twice a day.  Your physician recommends that you schedule a follow-up appointment in: 2-3 week.  At the [facility], you and your health needs are our priority. As part of our continuing mission to provide you with exceptional heart care, we have created designated Provider Care Teams. These Care Teams include your primary Cardiologist (physician) and Advanced Practice Providers (APPs- Physician Assistants and Nurse Practitioners) who all work together to provide you with the care you need, when you need it.   You may see any of the following providers on your designated Care Team at your next follow up: Marland Kitchen Dr Glori Bickers . Dr [name] . Darrick Grinder, NP

## 2019-04-07 NOTE — Progress Notes (Signed)
Patient had CHF clinic visit today.  Given familial/social stressors with the recent sudden and unexpected death of her husband, funeral yesterday, was asked if we could take a look at her visit today rather then her scheduled device clinic check next week.  Implant date was 03/31/2019  The patient had a pressure dressing in place that the CHF clinic NP removed prior to my arrival.  She has a hematoma, in d/w A. Clegg, NP, this is improved/reduced in size from the day of her discharge last week.  Steri strips are in place, removed without difficulty.  There is a large area of ecchymosis, this extends to level of xiphoid inferiorly, and towards axilla.  Mod- large hematoma is present non-tender, skin is not tense, there is no bleeding. Wound edges are healed, lateral of mid-incision there is a overlap of skin at the wound, there is no opening, no bleeding.  I placed a new steri-strip here given hematoma to give support here and ensure complete healing.  There is no erythema, no increased heat to the surrounding tissues, no evidence of infection.  I have asked the patient not to shower until we see her back.  I will have her wound check visit next week pushed to 2 weeks to lessen her appointment burden, though to notify us if any change/worsening.  ICD check was done by industry.RA lead sensing is down slightly to 0.9,  threshold 1.25@0 .60, her pulse width was increased to 0.42ms (voltage remains at 3.5V acute implant).  RV/LV lead testing stable.  She was asked to hold her Plavix until she is seen at her next visit with [place].  Tommye Standard, PA-C

## 2019-04-08 ENCOUNTER — Telehealth (HOSPITAL_COMMUNITY): Payer: Self-pay

## 2019-04-08 ENCOUNTER — Other Ambulatory Visit: Payer: Self-pay | Admitting: Family Medicine

## 2019-04-08 ENCOUNTER — Telehealth: Payer: Self-pay | Admitting: Internal Medicine

## 2019-04-08 ENCOUNTER — Telehealth (HOSPITAL_COMMUNITY): Payer: Self-pay | Admitting: *Deleted

## 2019-04-08 MED ORDER — TORSEMIDE 20 MG PO TABS: ORAL_TABLET | ORAL | 3 refills | Status: DC

## 2019-04-08 NOTE — Telephone Encounter (Signed)
Requested medication (s) are due for refill today: ?  Requested medication (s) are on the active medication list: yes  Last refill: 12/24/2018  Future visit scheduled: yes  Notes to clinic: historical provider    Requested Prescriptions  Pending Prescriptions Disp Refills   potassium chloride (K-DUR) 10 MEQ tablet [Pharmacy Med Name: POTASSIUM CHLORIDE ER 10 MEQ TAB] 60 tablet     Sig: TAKE 1 TABLET BY MOUTH TWICE (2) DAILY     Endocrinology:  Minerals - Potassium Supplementation Failed - 04/08/2019  9:18 AM      Failed - K in normal range and within 360 days    Potassium  Date Value Ref Range Status  04/07/2019 3.0 (L) 3.5 - 5.1 mmol/L Final         Failed - Cr in normal range and within 360 days    Creat  Date Value Ref Range Status  08/06/2016 1.51 (H) 0.50 - 0.99 mg/dL Final    Comment:      For patients > or = 65 years of age: The upper reference limit for Creatinine is approximately 13% higher for people identified as African-American.      Creatinine, Ser  Date Value Ref Range Status  04/07/2019 3.39 (H) 0.44 - 1.00 mg/dL Final         Passed - Valid encounter within last 12 months    Recent Outpatient Visits          2 weeks ago Acute on chronic systolic CHF (congestive heart failure) ([place])   [facility], Lilia Argue, MD   1 month ago Acute on chronic systolic CHF (congestive heart failure) [facility])   [facility], Lilia Argue, MD   3 months ago Other iron deficiency anemia   [facility], Lilia Argue, MD   5 months ago Chronic systolic heart failure [facility])   [facility], Lilia Argue, MD   5 months ago Transition of care performed with sharing of clinical summary   [facility], Lilia Argue, MD      Future Appointments            In 2 weeks Turner, Eber Hong, MD [place], [facility]

## 2019-04-08 NOTE — Telephone Encounter (Signed)
-----   Message from Conrad [place], NP sent at 04/07/2019  8:36 PM EDT ----- Please call hold torsemide tomorrow. Instruct her to take 40 meq of potassium tomorrow. On Friday start torsemide 40 mg in am and 20 mg in pm and resume 20 meq of potassium daily. Repeat BMET next week at her appointment.

## 2019-04-08 NOTE — Telephone Encounter (Signed)
Called pt to relay lab results and new med changes. Left voicemail for pt to call back.

## 2019-04-08 NOTE — Telephone Encounter (Signed)
Forwarding medication refill to PCP for review.   Patient was given emergency supply #60 tablets today. APPt scheduled for 04/16/19.

## 2019-04-08 NOTE — Telephone Encounter (Signed)
-----   Message from Conrad [place], NP sent at 04/07/2019  8:36 PM EDT ----- Please call hold torsemide tomorrow. Instruct her to take 40 meq of potassium tomorrow. On Friday start torsemide 40 mg in am and 20 mg in pm and resume 20 meq of potassium daily. Repeat BMET next week at her appointment.

## 2019-04-08 NOTE — Telephone Encounter (Signed)
Please call home health to advise on care plan for ppm site   Calling to make sure order is just for monitoring

## 2019-04-08 NOTE — Telephone Encounter (Signed)
Melinda with [facility] just needs clarification orders on what to do with patient's PPM site. At this time they are just monitoring the site. She wants to make sure they do not need to be dressing it or anything else.  Routing to Tommye Standard, [place] who saw patient yesterday for for any other directions concerning wound.   Rip Harbour said we can leave a voicemail with instructions as it is a Psychologist, occupational.

## 2019-04-08 NOTE — Telephone Encounter (Signed)
Notes recorded by Harvie Junior, CMA on 04/08/2019 at 8:55 AM EDT  Pt called back she is aware of results and voiced understanding.  ------   Notes recorded by Marlise Eves, RN on 04/08/2019 at 8:38 AM EDT  Left voicemail for pt to call back for lab results and med changes.  ------   Notes recorded by Conrad [place], NP on 04/07/2019 at 8:36 PM EDT  Please call hold torsemide tomorrow. Instruct her to take 40 meq of potassium tomorrow. On Friday start torsemide 40 mg in am and 20 mg in pm and resume 20 meq of potassium daily. Repeat BMET next week at her appointment.  ------   Notes recorded by Larey Dresser, MD on 04/07/2019 at 4:35 PM EDT  Creatinine lower, need additional K replacement.

## 2019-04-09 ENCOUNTER — Telehealth: Payer: Self-pay | Admitting: Nurse Practitioner

## 2019-04-09 NOTE — Telephone Encounter (Signed)
Called patient back, as requested, and we have rescheduled the 02/25/19 Telephone f/u visit for 04/19/19 @ 3 PM.

## 2019-04-13 ENCOUNTER — Ambulatory Visit: Payer: PPO

## 2019-04-13 ENCOUNTER — Other Ambulatory Visit: Payer: Self-pay

## 2019-04-14 ENCOUNTER — Encounter (HOSPITAL_COMMUNITY): Payer: Self-pay | Admitting: Internal Medicine

## 2019-04-14 ENCOUNTER — Other Ambulatory Visit: Payer: Self-pay

## 2019-04-14 ENCOUNTER — Ambulatory Visit (HOSPITAL_COMMUNITY)
Admission: RE | Admit: 2019-04-14 | Discharge: 2019-04-14 | Disposition: A | Discharge status: Home / Self Care | Payer: PPO | Source: Ambulatory Visit | Attending: Internal Medicine | Admitting: Internal Medicine

## 2019-04-14 VITALS — BP 126/76 | HR 79 | Wt 275.5 lb

## 2019-04-14 DIAGNOSIS — Z95810 Presence of automatic (implantable) cardiac defibrillator: Secondary | ICD-10-CM | POA: Insufficient documentation

## 2019-04-14 DIAGNOSIS — E559 Vitamin D deficiency, unspecified: Secondary | ICD-10-CM | POA: Insufficient documentation

## 2019-04-14 DIAGNOSIS — I252 Old myocardial infarction: Secondary | ICD-10-CM | POA: Diagnosis not present

## 2019-04-14 DIAGNOSIS — I472 Ventricular tachycardia: Secondary | ICD-10-CM | POA: Insufficient documentation

## 2019-04-14 DIAGNOSIS — Z6841 Body Mass Index (BMI) 40.0 and over, adult: Secondary | ICD-10-CM | POA: Insufficient documentation

## 2019-04-14 DIAGNOSIS — E7800 Pure hypercholesterolemia, unspecified: Secondary | ICD-10-CM | POA: Diagnosis not present

## 2019-04-14 DIAGNOSIS — E1142 Type 2 diabetes mellitus with diabetic polyneuropathy: Secondary | ICD-10-CM | POA: Diagnosis not present

## 2019-04-14 DIAGNOSIS — I2510 Atherosclerotic heart disease of native coronary artery without angina pectoris: Secondary | ICD-10-CM | POA: Insufficient documentation

## 2019-04-14 DIAGNOSIS — I471 Supraventricular tachycardia: Secondary | ICD-10-CM | POA: Insufficient documentation

## 2019-04-14 DIAGNOSIS — I428 Other cardiomyopathies: Secondary | ICD-10-CM | POA: Insufficient documentation

## 2019-04-14 DIAGNOSIS — Z79899 Other long term (current) drug therapy: Secondary | ICD-10-CM | POA: Insufficient documentation

## 2019-04-14 DIAGNOSIS — G4733 Obstructive sleep apnea (adult) (pediatric): Secondary | ICD-10-CM | POA: Insufficient documentation

## 2019-04-14 DIAGNOSIS — N183 Chronic kidney disease, stage 3 (moderate): Secondary | ICD-10-CM | POA: Diagnosis not present

## 2019-04-14 DIAGNOSIS — Z7902 Long term (current) use of antithrombotics/antiplatelets: Secondary | ICD-10-CM | POA: Insufficient documentation

## 2019-04-14 DIAGNOSIS — E1151 Type 2 diabetes mellitus with diabetic peripheral angiopathy without gangrene: Secondary | ICD-10-CM | POA: Insufficient documentation

## 2019-04-14 DIAGNOSIS — D649 Anemia, unspecified: Secondary | ICD-10-CM | POA: Insufficient documentation

## 2019-04-14 DIAGNOSIS — Z794 Long term (current) use of insulin: Secondary | ICD-10-CM | POA: Insufficient documentation

## 2019-04-14 DIAGNOSIS — I5022 Chronic systolic (congestive) heart failure: Secondary | ICD-10-CM | POA: Insufficient documentation

## 2019-04-14 DIAGNOSIS — I447 Left bundle-branch block, unspecified: Secondary | ICD-10-CM | POA: Insufficient documentation

## 2019-04-14 DIAGNOSIS — E1122 Type 2 diabetes mellitus with diabetic chronic kidney disease: Secondary | ICD-10-CM | POA: Insufficient documentation

## 2019-04-14 DIAGNOSIS — Z7982 Long term (current) use of aspirin: Secondary | ICD-10-CM | POA: Insufficient documentation

## 2019-04-14 DIAGNOSIS — I130 Hypertensive heart and chronic kidney disease with heart failure and stage 1 through stage 4 chronic kidney disease, or unspecified chronic kidney disease: Secondary | ICD-10-CM | POA: Diagnosis not present

## 2019-04-14 DIAGNOSIS — Z89429 Acquired absence of other toe(s), unspecified side: Secondary | ICD-10-CM | POA: Insufficient documentation

## 2019-04-14 LAB — CBC
HCT: 33.1 % — ABNORMAL LOW (ref 36.0–46.0)
Hemoglobin: 9.7 g/dL — ABNORMAL LOW (ref 12.0–15.0)
MCH: 26.6 pg (ref 26.0–34.0)
MCHC: 29.3 g/dL — ABNORMAL LOW (ref 30.0–36.0)
MCV: 90.9 fL (ref 80.0–100.0)
Platelets: 199 10*3/uL (ref 150–400)
RBC: 3.64 MIL/uL — ABNORMAL LOW (ref 3.87–5.11)
RDW: 16.6 % — ABNORMAL HIGH (ref 11.5–15.5)
WBC: 4.5 10*3/uL (ref 4.0–10.5)
nRBC: 0 % (ref 0.0–0.2)

## 2019-04-14 LAB — BASIC METABOLIC PANEL
Anion gap: 13 (ref 5–15)
BUN: 76 mg/dL — ABNORMAL HIGH (ref 8–23)
CO2: 30 mmol/L (ref 22–32)
Calcium: 9.6 mg/dL (ref 8.9–10.3)
Chloride: 100 mmol/L (ref 98–111)
Creatinine, Ser: 2.41 mg/dL — ABNORMAL HIGH (ref 0.44–1.00)
GFR calc Af Amer: 24 mL/min — ABNORMAL LOW (ref >60)
GFR calc non Af Amer: 20 mL/min — ABNORMAL LOW (ref >60)
Glucose, Bld: 102 mg/dL — ABNORMAL HIGH (ref 70–99)
Potassium: 3.8 mmol/L (ref 3.5–5.1)
Sodium: 143 mmol/L (ref 135–145)

## 2019-04-14 LAB — BRAIN NATRIURETIC PEPTIDE: B Natriuretic Peptide: 1604.1 pg/mL — ABNORMAL HIGH (ref 0.0–100.0)

## 2019-04-14 NOTE — Patient Instructions (Signed)
Labs done today  Please continue current medications  Your physician recommends that you schedule a follow-up appointment in: 3-4 weeks  If you have any questions or concerns before your next appointment please send [place] a message through [place] or call our office at [phone].  At the [facility], you and your health needs are our priority. As part of our continuing mission to provide you with exceptional heart care, we have created designated Provider Care Teams. These Care Teams include your primary Cardiologist (physician) and Advanced Practice Providers (APPs- Physician Assistants and Nurse Practitioners) who all work together to provide you with the care you need, when you need it.   You may see any of the following providers on your designated Care Team at your next follow up: Marland Kitchen Dr Glori Bickers . Dr [name] . Darrick Grinder, NP

## 2019-04-14 NOTE — Progress Notes (Signed)
PCP: Primary Cardiologist: Dr Haroldine Laws  HPI: Vickie Niznik Gerringeris a 63 y.o.femalewith MI>>LAD DES,LBBB, mixedNICM/ICM combinedsystolic and diastolic CHF,status post CRT-D 2015 with improvement in EF to 40 to 45%>>10-15% after ICD removed 2nd bacteremia,, DM 2, HTN, obesity,PAD w/ PTA L anterior tibial artery 10/06/2018,[facility] prescribed CPAP, NSTEMI 12/2019s/p cath w/ med rx for CAD>>2/2 MSSA bacteremia s/p toe amputation, TV and lead vegetations, s/p Angiovac &ICD removal at [facility] .   Admitted on 03/23/19 with A/C biventricular HF. EF improved previously with CRT but now back down again after CRT removal due to sepsis.She had marked volume overload on exam and rising creatinine. Possibly low output. Started on IV lasix + metolazone.Placed on milrinone which was later weaned off. Unfortunately while hospitalized her husband had CVA and passed away on 04/03/23. On 2023-04-03 she underwent CRT-D.  She is being discharged today with volume overload and worsening renal function, but she is anxious to go home and plan her husbands funeral. Discharge weight 287 pounds. Discharge with [facility].   Today she presents for HF follow up. Continues to feel stronger. Able to do all ADLs by herself. Edema well controlled. Weight down taking torsemide 40/20. No syncope/presyncope. ICD site healing well.   ROS: All systems negative except as listed in HPI, PMH and Problem List.  SH:  Social History   Socioeconomic History  . Marital status: Married    Spouse name: [name]  . Number of children: 2  . Years of education: 28  . Highest education level: High school graduate  Occupational History  . Occupation: disabled    Fish farm manager: UNEMPLOYED    Comment: 03/2010 for peripheral neuropathy  . 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  Social Needs  . Financial resource strain: Not hard at all  . Food insecurity    Worry: Never true    Inability: Never true  . Transportation needs    Medical: No   Non-medical: No  Tobacco Use  . Smoking status: Never Smoker  . Smokeless tobacco: Never Used  Substance and Sexual Activity  . Alcohol use: No    Alcohol/week: 0.0 standard drinks  . Drug use: No  . Sexual activity: Never    Birth control/protection: Post-menopausal, Surgical  Lifestyle  . Physical activity    Days per week: 0 days    Minutes per session: 0 min  . Stress: Rather much  Relationships  . Social connections    Talks on phone: More than three times a week    Gets together: More than three times a week    Attends religious service: Never    Active member of club or organization: No    Attends meetings of clubs or organizations: Never    Relationship status: Married  . Intimate partner violence    Fear of current or ex partner: No    Emotionally abused: No    Physically abused: No    Forced sexual activity: No  Other Topics Concern  . Not on file  Social History Narrative   Marital status:Married x 45 yrs. Happily married, no abuse.       Children:  2 children (daughter 62, son 37). Two grandsons and 2 step grandchildren.  1 gg.      Lives: with husband, daughter, son-in-law, 2 grandsons, and 1 on the way.      Employment: disability for peripheral neuropathy 2012.  Previously had home daycare.      Tobacco: none      Alcohol: none  Drugs: none      Exercise: none. [employer].      Pets: dog.      Always uses seat belts, smoke detectors in home.      No guns in the home.       Caffeine use: 2 cups coffee per day.       Nutrition: Well balanced diet.    FH:  Family History  Problem Relation Age of Onset  . Diabetes Mother   . Hypertension Mother   . Arthritis Mother        knees, lumbar DDD, cervical DDD  . Cancer Father        prostate,skin,lymphoma.  . Cancer Brother 54       bladder cancer; non-smoker  . Diabetes Maternal Grandmother   . Heart disease Maternal Grandmother   . COPD Maternal Grandmother   . Diabetes Paternal  Grandmother   . Obesity Brother   . Diabetes Son   . Hypertension Son   . Cancer Maternal Grandfather   . Diabetes Paternal Grandfather     Past Medical History:  Diagnosis Date  . Acute myocardial infarction, subendocardial infarction, initial episode of care ([place]) 07/21/2012  . Acute osteomyelitis involving ankle and foot ([place]) 02/06/2015  . Acute systolic heart failure ([place]) 07/21/2012   New onset 07/19/12; admission to [facility] ED. Elevated Troponins.  S/p 2D-echo with EF 20-25%.  S/p cardiac catheterization with stenting LAD.  Repeat 2D-echo 10/2011 with improved EF of 35%.   . Anemia   . Automatic implantable cardioverter-defibrillator in situ    a. MDT CRT-D 06/2014, SN: [id number] H  .removed in feb 2020  . CAD (coronary artery disease)    a. cardiac cath 101/04/2012: PCI/DES to chronically occluded mLAD, consideration PCI to diag branch in 4 weeks.   . Cataract   . Chicken pox   . Chronic kidney disease   . Chronic systolic CHF (congestive heart failure) (HCC)    a. mixed ICM & NICM; b. EF 20-25% by echo 07/2012, mid-dist 2/3 of LV sev HK/AK, mild MR. echo 10/2012: EF 30-35%, sev HK ant-septal & inf walls, GR1DD, mild MR, PASP 33. c. echo 02/2013: EF 30%, GR1DD, mild MR. echo 04/2014: EF 30%, Septal-lat dyssynchrony, global HK, inf AK, GR1DD, mild MR. d. echo 10/2014: EF50-55%, WM nl, GR1DD, septal mild paradox. e. echo 02/2015: EF 50-55%, wm   . Depression   . Heart attack ([place])   . Heel ulcer ([place]) 04/27/2015  . History of blood transfusion ~ 2011   "plasma; had neuropathy; couldn't walk"  . Hypertension   . LBBB (left bundle branch block)   . Neuromuscular disorder ([place])   . Neuropathy 2011  . Obesity, unspecified   . OSA on CPAP    Moderate with AHI 23/hr and now on CPAP at 16cm H2O.  Her DME is [facility]  . Pure hypercholesterolemia   . Sepsis ([place]) 09/2018  . Type II diabetes mellitus ([place])   . Unspecified vitamin D deficiency     Current Outpatient Medications  Medication  Sig Dispense Refill  . amiodarone (PACERONE) 200 MG tablet Take 400 mg twice a day x 7 days then 200 mg twice a day then 200 mg daiy 80 tablet 6  . aspirin EC 81 MG EC tablet Take 1 tablet (81 mg total) by mouth daily. 30 tablet 0  . Calcium Carbonate-Vitamin D3 (CALCIUM 600-D) 600-400 MG-UNIT TABS Take 1 tablet by mouth 2 (two) times a day.    Marland Kitchen  clopidogrel (PLAVIX) 75 MG tablet Take 1 tablet (75 mg total) by mouth daily. 90 tablet 0  . docusate sodium (COLACE) 100 MG capsule Take 1 capsule (100 mg total) by mouth 2 (two) times daily. 60 capsule 3  . ferrous sulfate 325 (65 FE) MG tablet Take 325 mg by mouth 2 (two) times daily with a meal.    . gabapentin (NEURONTIN) 600 MG tablet Take 1 tablet (600 mg total) by mouth 2 (two) times daily. Takes 1 in the morning and two at bedtime 180 tablet 1  . glucose blood (ONE TOUCH ULTRA TEST) test strip USE AS DIRECTED TO CHECK BLOOD SUGAR 3 TIMES DAILY 300 each 11  . insulin glargine (LANTUS) 100 UNIT/ML injection Inject 0.16 mLs (16 Units total) into the skin at bedtime. (Patient taking differently: Inject 14 Units into the skin at bedtime. ) 10 mL 11  . Multiple Vitamin (MULTIVITAMIN) tablet Take 1 tablet by mouth daily.    . Omega-3 Fatty Acids (FISH OIL) 1000 MG CAPS Take 1 capsule by mouth 2 (two) times a day.     . potassium chloride (K-DUR) 10 MEQ tablet Take 20 mEq by mouth daily.    . rosuvastatin (CRESTOR) 40 MG tablet TAKE 1 TABLET BY MOUTH ONCE DAILY 30 tablet 0  . traMADol (ULTRAM) 50 MG tablet Take 1 tablet (50 mg total) by mouth every 6 (six) hours as needed. 120 tablet 5  . torsemide (DEMADEX) 20 MG tablet Take 2 tablets (40 mg) in the AM and 1 tabler (20mg ) in the PM 540 tablet 3   No current facility-administered medications for this encounter.     Vitals:   04/14/19 0947  BP: 126/76  Pulse: 79  SpO2: 98%  Weight: 125 kg (275 lb 8 oz)   Wt Readings from Last 3 Encounters:  04/14/19 125 kg (275 lb 8 oz)  04/07/19 128 kg (282 lb  3.2 oz)  04/02/19 130.6 kg (287 lb 14.7 oz)    PHYSICAL EXAM:  General:  In chair. No resp difficulty HEENT: normal Neck: supple. JVP 8-9 with prominent v waves. Carotids 2+ bilat; no bruits. No lymphadenopathy or thryomegaly appreciated. Cor: PMI nondisplaced. Regular rate & rhythm. 2/6 TR  ICD site healing well Lungs: clear Abdomen: obese soft, nontender, nondistended. No hepatosplenomegaly. No bruits or masses. Good bowel sounds. Extremities: no cyanosis, clubbing, rash, trace-1+ edema Neuro: alert & orientedx3, cranial nerves grossly intact. moves all 4 extremities w/o difficulty. Affect pleasant  ICD interrogation: 100% LV pacing. No VT/AF Personally reviewed   ASSESSMENT & PLAN: 1.  Chronic CHF due to mixed ICM. NICM with LBBB (167ms) - Echo 03/22/19 EF 10-15% (down from 40-45% prior to removal of CRT) - s/p CRT-D replacement 6/25 - Improving NYHA III. Volume status much improved continue torsemide 40/20 - No bb for now.  - No spiro, dig, or arb with CKD.   - Consider hydral/nitrates at next visit - check labs today  2. Chronic CKD III -BMET today.  3. PAT & NSVT, frequent PVCs -Continue amiodarone taper  4. CAD - No s/s ischemia - last cath in 12/19 with severe LAD mid to distal LAD disease after previous stent. No PCI options - continue statin and DAPT  5. DM2 - Lantus adjusted and now improved - cover with SSI - HgBa1c 6.7  6. H/o PAD - s/p bilateral toe amputations due to non-healing wounds  7. Morbid obesity Discussed portion control.   8. S/P CRT-D [employer]  - site looks  good   Glori Bickers  MD 10:13 AM

## 2019-04-15 ENCOUNTER — Other Ambulatory Visit (HOSPITAL_COMMUNITY): Payer: Self-pay

## 2019-04-15 ENCOUNTER — Encounter (HOSPITAL_COMMUNITY): Payer: Self-pay

## 2019-04-15 NOTE — Progress Notes (Signed)
Today was first visit at home with [facility].  Explained the program to her and she is willing to be a part of the program.  She had appt with HF clinic yesterday and was given good report with no med changes.  She fills her med box for a week and aware of how to take her meds.  Meds were verified, she has been advised to stop her plavix due to recent procedure.  She weighs daily and writes it down.  She checks her bp and sugar daily.  She is aware of up coming appts she has.  Lungs are clear, her swelling is down, she advised her abdomen does not feel tight.  She denies chest pain, headaches, shortness of breath or dizziness today.  Did advise her to contact HF clinic or myself if weight gain of 3 lbs overnight or 5 lbs in a week.  Discussed diet, she is aware of how to read labels, she watches her sodium and high carb foods.  She measures her fluids daily.  She did advised in maybe 3 months she will be moving to [facility], advised her I could switch her to [employer] there, she is good with that.  She is willing to take as much help as she can get.  Her sugar today is 91, she only eats once a day at supper.  Explained importance of eating throughout the day and when she takes her medications.  She states she will try to start eating breakfast.  Will visit with her at home every 2 weeks, call her every week for right now.  She has contact information to call when there is a problems.  She is still mourning her husband that passed away a little over a week ago. Will visit for heart failure.   [place] [phone]

## 2019-04-16 ENCOUNTER — Other Ambulatory Visit: Payer: Self-pay

## 2019-04-16 ENCOUNTER — Telehealth: Payer: Self-pay | Admitting: Family Medicine

## 2019-04-16 ENCOUNTER — Telehealth (INDEPENDENT_AMBULATORY_CARE_PROVIDER_SITE_OTHER): Payer: PPO | Admitting: Family Medicine

## 2019-04-16 ENCOUNTER — Ambulatory Visit: Payer: PPO

## 2019-04-16 DIAGNOSIS — N189 Chronic kidney disease, unspecified: Secondary | ICD-10-CM | POA: Diagnosis not present

## 2019-04-16 DIAGNOSIS — I5022 Chronic systolic (congestive) heart failure: Secondary | ICD-10-CM

## 2019-04-16 DIAGNOSIS — N179 Acute kidney failure, unspecified: Secondary | ICD-10-CM

## 2019-04-16 DIAGNOSIS — F4321 Adjustment disorder with depressed mood: Secondary | ICD-10-CM

## 2019-04-16 DIAGNOSIS — E1142 Type 2 diabetes mellitus with diabetic polyneuropathy: Secondary | ICD-10-CM | POA: Diagnosis not present

## 2019-04-16 DIAGNOSIS — Z95810 Presence of automatic (implantable) cardiac defibrillator: Secondary | ICD-10-CM

## 2019-04-16 MED ORDER — ROSUVASTATIN CALCIUM 10 MG PO TABS: 10.0000 mg | ORAL_TABLET | Freq: Every day | ORAL | 3 refills | Status: DC

## 2019-04-16 MED ORDER — POTASSIUM CHLORIDE CRYS ER 20 MEQ PO TBCR: 20.0000 meq | EXTENDED_RELEASE_TABLET | Freq: Two times a day (BID) | ORAL | 1 refills | Status: DC

## 2019-04-16 MED ORDER — INSULIN GLARGINE 100 UNIT/ML ~~LOC~~ SOLN: 16.0000 [IU] | Freq: Every day | SUBCUTANEOUS | 11 refills | Status: DC

## 2019-04-16 NOTE — Telephone Encounter (Signed)
Home Health Verbal Orders - Caller/Agency: [facility] Callback Number: [phone] (ok to leave VM) Requesting OT/PT/Skilled Nursing/Social Work/Speech Therapy: Skilled Nursing  Frequency: 1 x 4wks and 1 x every other week for 5 wks  (9 wks total)

## 2019-04-16 NOTE — Progress Notes (Signed)
Pt is pleased to report that the fluid hs gone down tremendously. She is currently 270lbs from 297lbs.

## 2019-04-16 NOTE — Progress Notes (Signed)
Virtual Visit Note  I connected with patient on 04/16/19 at 658pm by phone and verified that I am speaking with the correct person using two identifiers. [name] is currently located at home and patient is currently with them during visit. The provider, Rutherford Guys, MD is located in their office at time of visit.  I discussed the limitations, risks, security and privacy concerns of performing an evaluation and management service by telephone and the availability of in person appointments. I also discussed with the patient that there may be a patient responsible charge related to this service. The patient expressed understanding and agreed to proceed.   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,MSSA bacteremia2/2 osteomyelitis s/p toe amputation,systolicCHF, OSA on cpap, DM2 on insulin, CKD4, HTN, HLP  here forroutine followup  Since our last OV in May patient was hospitalized again for CHF, released about 2 weeks ago hosp and clinic notes reviewed Had CRT-D placed, overall doing much better fluidwise Torsemide 40mg  AM and 20mg  PM KCL 83meq BID Monitoring her salt, following fluid restriction of 1.2L, daily weight plavix was stopped due to extensive bruising from ICD placement until bruising cleared Needs refill of crestor  Wt Readings from Last 3 Encounters:  04/15/19 271 lb (122.9 kg)  04/14/19 275 lb 8 oz (125 kg)  04/07/19 282 lb 3.2 oz (128 kg)   Lab Results  Component Value Date   CREATININE 2.41 (H) 04/14/2019   CREATININE 3.39 (H) 04/07/2019   CREATININE 3.57 (H) 04/02/2019   Lab Results  Component Value Date   WBC 4.5 04/14/2019   HGB 9.7 (L) 04/14/2019   HCT 33.1 (L) 04/14/2019   MCV 90.9 04/14/2019   PLT 199 04/14/2019    BP Readings from Last 3 Encounters:  04/15/19 132/74  04/14/19 126/76  04/07/19 126/70   Lab Results  Component Value Date   CHOL 82 (L)  2019/02/08   HDL 37 (L) 02/08/2019   LDLCALC 25 Feb 08, 2019   TRIG 101 2019/02/08   CHOLHDL 2.2 Feb 08, 2019    Her husband died while she was in the hospital She was able to see him She is overall coping well with grief, has good family support  Checks cbgs at home cbgs range 110-140s Denies any lows Taking lantus 16 units at bedtime D/c glipizide due to crt Watching her diet Takes tramadol 2 tabs every night and gabapentin 600mg  AM and 1200mg  QHS - for neuropathy  Lab Results  Component Value Date   HGBA1C 6.7 (H) 03/23/2019    Has EMS checking on her weekly Has been able to wean from 4L to 2L of nocturnal oxygen Has been checking pulse ox, rarely seeing 88-89% if really up and being very active, then she will use her oxygen Home health and palliative care also involved Needs paper form signed from [employer]   Patient Care Team: Rutherford Guys, MD as PCP - General (Family Medicine) Minna Merritts, MD as PCP - Cardiology (Cardiology) Deboraha Sprang, MD as PCP - Electrophysiology (Cardiology) Bensimhon, Shaune Pascal, MD as PCP - Advanced Heart Failure (Cardiology) Calvert Cantor, MD as Consulting Physician (Ophthalmology) Lavonia Dana, MD as Consulting Physician (Internal Medicine) Elvina Mattes, Adele Schilder as Attending Physician (Podiatry) Tsosie Billing, MD as Consulting Physician (Infectious Diseases)  No Known Allergies  Prior to Admission medications   Medication Sig Start Date End Date Taking? Authorizing Provider  amiodarone (PACERONE) 200 MG tablet Take 400 mg twice a day x 7 days then 200 mg twice a day then 200 mg daiy 04/02/19   Darrick Grinder D, NP  aspirin EC 81 MG EC tablet Take 1 tablet (81 mg total) by mouth daily. 11/24/18   Epifanio Lesches, MD  Calcium Carbonate-Vitamin D3 (CALCIUM 600-D) 600-400 MG-UNIT TABS Take 1 tablet by mouth 2 (two) times a day.    [provider]  clopidogrel (PLAVIX) 75 MG tablet Take 1 tablet (75 mg  total) by mouth daily. Patient not taking: Reported on 04/15/2019 12/24/18   Rutherford Guys, MD  docusate sodium (COLACE) 100 MG capsule Take 1 capsule (100 mg total) by mouth 2 (two) times daily. 03/22/19   Rutherford Guys, MD  ferrous sulfate 325 (65 FE) MG tablet Take 325 mg by mouth 2 (two) times daily with a meal.    [provider]  gabapentin (NEURONTIN) 600 MG tablet Take 1 tablet (600 mg total) by mouth 2 (two) times daily. Takes 1 in the morning and two at bedtime 12/24/18   Rutherford Guys, MD  glucose blood (ONE TOUCH ULTRA TEST) test strip USE AS DIRECTED TO CHECK BLOOD SUGAR 3 TIMES DAILY 01/09/19   Rutherford Guys, MD  insulin glargine (LANTUS) 100 UNIT/ML injection Inject 0.16 mLs (16 Units total) into the skin at bedtime. Patient taking differently: Inject 14 Units into the skin at bedtime.  11/24/18   Epifanio Lesches, MD  Multiple Vitamin (MULTIVITAMIN) tablet Take 1 tablet by mouth daily.    [provider]  Omega-3 Fatty Acids (FISH OIL) 1000 MG CAPS Take 1 capsule by mouth 2 (two) times a day.     [provider]  potassium chloride (K-DUR) 10 MEQ tablet Take 20 mEq by mouth daily.    [provider]  potassium chloride (K-DUR) 10 MEQ tablet TAKE 1 TABLET BY MOUTH TWICE (2) DAILY 04/16/19   Rutherford Guys, MD  rosuvastatin (CRESTOR) 40 MG tablet TAKE 1 TABLET BY MOUTH ONCE DAILY 02/08/19   Wellington Hampshire, MD  torsemide (DEMADEX) 20 MG tablet Take 2 tablets (40 mg) in the AM and 1 tabler (20mg ) in the PM 04/08/19   Clegg, Amy D, NP  traMADol (ULTRAM) 50 MG tablet Take 1 tablet (50 mg total) by mouth every 6 (six) hours as needed. 01/07/19   Rutherford Guys, MD    Past Medical History:  Diagnosis Date  . Acute myocardial infarction, subendocardial infarction, initial episode of care ([place]) 07/21/2012  . Acute osteomyelitis involving ankle and foot ([place]) 02/06/2015  . Acute systolic heart failure ([place]) 07/21/2012   New onset 07/19/12;  admission to [facility] ED. Elevated Troponins.  S/p 2D-echo with EF 20-25%.  S/p cardiac catheterization with stenting LAD.  Repeat 2D-echo 10/2011 with improved EF of 35%.   . Anemia   . Automatic implantable cardioverter-defibrillator in situ    a. MDT CRT-D 06/2014, SN: [id number]  .removed in feb 2020  . CAD (coronary artery disease)    a. cardiac cath 101/04/2012: PCI/DES to chronically occluded mLAD, consideration PCI to diag branch in 4 weeks.   . Cataract   . Chicken pox   . Chronic kidney disease   . Chronic systolic CHF (congestive heart failure) (HCC)    a. mixed ICM & NICM; b. EF 20-25% by echo 07/2012, mid-dist 2/3 of LV sev HK/AK, mild MR. echo 10/2012: EF 30-35%, sev HK ant-septal & inf walls, GR1DD, mild  MR, PASP 33. c. echo 02/2013: EF 30%, GR1DD, mild MR. echo 04/2014: EF 30%, Septal-lat dyssynchrony, global HK, inf AK, GR1DD, mild MR. d. echo 10/2014: EF50-55%, WM nl, GR1DD, septal mild paradox. e. echo 02/2015: EF 50-55%, wm   . Depression   . Heart attack ([place])   . Heel ulcer ([place]) 04/27/2015  . History of blood transfusion ~ 2011   "plasma; had neuropathy; couldn't walk"  . Hypertension   . LBBB (left bundle branch block)   . Neuromuscular disorder ([place])   . Neuropathy 2011  . Obesity, unspecified   . OSA on CPAP    Moderate with AHI 23/hr and now on CPAP at 16cm H2O.  Her DME is [facility]  . Pure hypercholesterolemia   . Sepsis ([place]) 09/2018  . Type II diabetes mellitus ([place])   . Unspecified vitamin D deficiency     Past Surgical History:  Procedure Laterality Date  . AMPUTATION TOE Right 06/18/2015   Procedure: AMPUTATION TOE;  Surgeon: Samara Deist, DPM;  Location: [facility] ORS;  Service: Podiatry;  Laterality: Right;  . AMPUTATION TOE Left 10/08/2018   Procedure: AMPUTATION TOE LEFT 2ND;  Surgeon: Albertine Patricia, DPM;  Location: [facility] ORS;  Service: Podiatry;  Laterality: Left;  . BACK SURGERY    . BI-VENTRICULAR IMPLANTABLE CARDIOVERTER DEFIBRILLATOR N/A 07/06/2014    Procedure: BI-VENTRICULAR IMPLANTABLE CARDIOVERTER DEFIBRILLATOR  (CRT-D);  Surgeon: Deboraha Sprang, MD;  Location: [facility] CATH LAB;  Service: Cardiovascular;  Laterality: N/A;  . BI-VENTRICULAR IMPLANTABLE CARDIOVERTER DEFIBRILLATOR  (CRT-D)  07/06/2014  . BILATERAL OOPHORECTOMY  01/2011   ovarian cyst benign  . BIV ICD INSERTION CRT-D N/A 03/31/2019   Procedure: BIV ICD INSERTION CRT-D;  Surgeon: Constance Haw, MD;  Location: [place] CV LAB;  Service: Cardiovascular;  Laterality: N/A;  . COLONOSCOPY WITH PROPOFOL Left 02/22/2015   Procedure: COLONOSCOPY WITH PROPOFOL;  Surgeon: Hulen Luster, MD;  Location: [facility] ENDOSCOPY;  Service: Endoscopy;  Laterality: Left;  . CORONARY ANGIOPLASTY WITH STENT PLACEMENT Left 07/2012   new onset systolic CHF; elevated troponins.  Cardiac catheterization with stenting to LAD; EF 15%.  2D-echo: EF 20-25%.  . ESOPHAGOGASTRODUODENOSCOPY N/A 02/22/2015   Procedure: ESOPHAGOGASTRODUODENOSCOPY (EGD);  Surgeon: Hulen Luster, MD;  Location: [facility] ENDOSCOPY;  Service: Endoscopy;  Laterality: N/A;  . INCISION AND DRAINAGE ABSCESS Right 2007   groin; with ICU stay due to sepsis.  Marland Kitchen LAPAROSCOPIC CHOLECYSTECTOMY  2011  . LEFT HEART CATH AND CORONARY ANGIOGRAPHY N/A 10/05/2018   Procedure: LEFT HEART CATH AND CORONARY ANGIOGRAPHY;  Surgeon: Minna Merritts, MD;  Location: [place] CV LAB;  Service: Cardiovascular;  Laterality: N/A;  . LEFT HEART CATHETERIZATION WITH CORONARY ANGIOGRAM N/A 07/21/2012   Procedure: LEFT HEART CATHETERIZATION WITH CORONARY ANGIOGRAM;  Surgeon: Jolaine Artist, MD;  Location: [facility] CATH LAB;  Service: Cardiovascular;  Laterality: N/A;  . [place]   L4-5  . PACEMAKER REMOVAL  11/2018   due to infection around pacemaker  . PERCUTANEOUS CORONARY STENT INTERVENTION (PCI-S) N/A 07/23/2012   Procedure: PERCUTANEOUS CORONARY STENT INTERVENTION (PCI-S);  Surgeon: Sherren Mocha, MD;  Location: [facility] CATH LAB;  Service:  Cardiovascular;  Laterality: N/A;  . PERIPHERAL VASCULAR BALLOON ANGIOPLASTY Left 10/06/2018   Procedure: PERIPHERAL VASCULAR BALLOON ANGIOPLASTY;  Surgeon: Katha Cabal, MD;  Location: [place] CV LAB;  Service: Cardiovascular;  Laterality: Left;  . PERIPHERAL VASCULAR CATHETERIZATION N/A 02/10/2015   Procedure: Picc Line Insertion;  Surgeon: Katha Cabal, MD;  Location: [facility]  INVASIVE CV LAB;  Service: Cardiovascular;  Laterality: N/A;  . RIGHT HEART CATH N/A 03/29/2019   Procedure: RIGHT HEART CATH;  Surgeon: Jolaine Artist, MD;  Location: [place] CV LAB;  Service: Cardiovascular;  Laterality: N/A;  . TEE WITHOUT CARDIOVERSION N/A 10/02/2018   Procedure: TRANSESOPHAGEAL ECHOCARDIOGRAM (TEE);  Surgeon: Wellington Hampshire, MD;  Location: [facility] ORS;  Service: Cardiovascular;  Laterality: N/A;  . [place]  . VAGINAL HYSTERECTOMY  01/2011   Fibroids/DUB.  Ovaries removed. [place].    Social History   Tobacco Use  . Smoking status: Never Smoker  . Smokeless tobacco: Never Used  Substance Use Topics  . Alcohol use: No    Alcohol/week: 0.0 standard drinks    Family History  Problem Relation Age of Onset  . Diabetes Mother   . Hypertension Mother   . Arthritis Mother        knees, lumbar DDD, cervical DDD  . Cancer Father        prostate,skin,lymphoma.  . Cancer Brother 64       bladder cancer; non-smoker  . Diabetes Maternal Grandmother   . Heart disease Maternal Grandmother   . COPD Maternal Grandmother   . Diabetes Paternal Grandmother   . Obesity Brother   . Diabetes Son   . Hypertension Son   . Cancer Maternal Grandfather   . Diabetes Paternal Grandfather     Review of Systems  Constitutional: Negative for chills and fever.  Respiratory: Positive for shortness of breath (improving). Negative for cough.   Cardiovascular: Positive for leg swelling (improving). Negative for chest pain and palpitations.  Gastrointestinal: Negative for  abdominal pain, nausea and vomiting.  per hpi  Objective  Vitals as reported by the patient: none   ASSESSMENT and PLAN  1. Diabetic peripheral neuropathy associated with type 2 diabetes mellitus ([place]) Controlled. Continue current regime.   2. Acute kidney injury superimposed on chronic kidney disease ([place]) Improving. Cont monitoring.   3. Chronic systolic congestive heart failure ([place]) 4. Presence of cardiac resynchronization therapy defibrillator (CRT-D) Improving. Under advance heart failure clinic care.  Renally dose crestor  5. Grief Coping well. Good family support.  Other orders - insulin glargine (LANTUS) 100 UNIT/ML injection; Inject 0.16 mLs (16 Units total) into the skin at bedtime. - potassium chloride (K-DUR) 20 MEQ tablet; Take 1 tablet (20 mEq total) by mouth 2 (two) times daily. - rosuvastatin (CRESTOR) 10 MG tablet; Take 1 tablet (10 mg total) by mouth daily.  FOLLOW-UP: 4 weeks   The above assessment and management plan was discussed with the patient. The patient verbalized understanding of and has agreed to the management plan. Patient is aware to call the clinic if symptoms persist or worsen. Patient is aware when to return to the clinic for a follow-up visit. Patient educated on when it is appropriate to go to the emergency department.    I provided 15 minutes of non-face-to-face time during this encounter.  Rutherford Guys, MD Primary Care at [facility] [place], [place] [address] Ph.  [phone] Fax [phone]

## 2019-04-19 ENCOUNTER — Telehealth: Payer: Self-pay

## 2019-04-19 ENCOUNTER — Other Ambulatory Visit: Payer: Self-pay

## 2019-04-19 ENCOUNTER — Encounter: Payer: Self-pay | Admitting: Nurse Practitioner

## 2019-04-19 ENCOUNTER — Other Ambulatory Visit: Payer: PPO | Admitting: Nurse Practitioner

## 2019-04-19 DIAGNOSIS — Z515 Encounter for palliative care: Secondary | ICD-10-CM | POA: Diagnosis not present

## 2019-04-19 DIAGNOSIS — R0602 Shortness of breath: Secondary | ICD-10-CM | POA: Diagnosis not present

## 2019-04-19 DIAGNOSIS — R531 Weakness: Secondary | ICD-10-CM | POA: Diagnosis not present

## 2019-04-19 NOTE — Telephone Encounter (Signed)
Verbal given 

## 2019-04-19 NOTE — Progress Notes (Signed)
Therapist, nutritional Palliative Care Consult Note Telephone: [phone]  Fax: [phone]  PATIENT NAME: [name] DOB: [birth year] MRN: [id number]  PRIMARY CARE PROVIDER:   Myles Lipps, MD  REFERRING PROVIDER:  Myles Lipps, MD [address] [place],  [place] [address]  RESPONSIBLE PARTY:   Self  Due to the COVID-19 crisis, this visit was done via telemedicine from my office and it was initiated and consent by this patient and or family.  RECOMMENDATIONS and PLAN:  1.Palliative care encounter Z51.5; Palliative medicine team will continue to support patient, patient's family, and medical team. Visit consisted of counseling and education dealing with the complex and emotionally intense issues of symptom management and palliative care in the setting of serious and potentially life-threatening illness  2.Dyspneic R06.00 secondary to remain stable at present time. Continue daily weights  3.Generalized weaknessR53.1 secondary to CHF/CMcontinue with therapy as able. Encourage energy conservation and rest times.  ASSESSMENT:     I called [name] for schedule palliative care follow-up visit. Vickie [name] and I talked about purpose of visit and she was an agreement. We talked about recent events of hospitalization and having her pacemaker replaced. We talked about symptoms of shortness of breath what she currently is stable but does remain on oxygen continuous. She came home on 4L/[name] and now she is down to 2L/[name]. We talked about the importance of rest, energy conservation. She was discharged home with home health. PT was put on hold for now. She currently denies pain. We talked about edema. We talked about her weight fluctuation which she continues to lose about a pound a day. We talked about her appetite which has been improving. We talked about [name] program for which she is very thankful for. We talked about congestive heart failure  clinic and Dr. Graciela [name] who she is very grateful for. We talked about medical goals of care including aggressive versus conservative versus Comfort Care. DNR form is completed in [name] is in her home. We reviewed most form and she shared that she would not want a feeding tube but would want to return to the hospital if necessary, IV fluids and antibiotics. Wishes are for next schedule palliative care visit will complete most form. We talked about the passing of her husband. She talked about her being in the hospital and then he became sick at home and both were at [name] and intensive care. [name] endorses the nurses were wonderful and took good care of her. They brought her down to see her husband twice. She was able to see him by video also. She talked about his loss, grieving in the funeral. We talked about coping strategies. We talked about role of palliative care and plan of care. Scheduled follow-up visit and Vickie. [name] in agreement. Therapeutic listening and emotional support provided. Contact information provided. Questions answered to satisfaction.  I spent 40 minutes providing this consultation,  from 3:00pm to 3:40pm. More than 50% of the time in this consultation was spent coordinating communication.   HISTORY OF PRESENT ILLNESS:  [name] is a 63 y.o. year old female with multiple medical problems including Late onset CVA, Systolic congestive heart failure, coronary artery disease, automatic implantable cardioverter-defibrillator placed, myocardial infarction, left bundle branch block, his shaved endocarditis, obstructive sleep apnea, diabetes, hyperlipidemia, lymphedema, hypertension, obesity, neuropathy, chronic kidney disease, iron deficiency anemia, depression, history of osteomyelitis ankle/foot 2013, cataracts, neuropathy, vaginal hysterectomy with oophorectomy, tubal ligation, TEE without cardioversion, right,  left toe amputation, lumbar disc surgery, cholecystectomy.  Cardiology visit 7 / 1 / 2020 in CHF Clinic. Her documentation Family Social stressors with sudden recent Unexpected death of her husband with funeral being held 55 / 64 / 27. She had an implant 6 / 24 / 2020 and hematoma at site which continue large area that extending eccymosis of the xiphoid and fairly towards the axillary.  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 She was admitted 6 / 84 / 2020 with A/C biventricular HF. Ejection fraction improve previously with CRT but now back down again after CRT removal secondary to sepsis. Fluid overload on exam with rising creatinine with low output. She was start on metolazone and IV Lasix. She was placed on milrinone which was weaned off. On 6 / 24 / 2020 she underwent a CRT-D and unfortunately her husband passed away of a CVA same day. She was discharged home with home health and Community paramedic to continue to be followed by palliative care. She was seen by the community paramedic 7 / 9 / 2020. She continues to watch her sodium, diet. She did verbalize that she could be moving to [name] in about three months. Vickie. [name] was discharged back to her home with Home Health nursing. Palliative Care was asked to help to continue to address goals of care.   CODE STATUS: DNR  PPS: 40% HOSPICE ELIGIBILITY/DIAGNOSIS: TBD  PAST MEDICAL HISTORY:  Past Medical History:  Diagnosis Date   Acute myocardial infarction, subendocardial infarction, initial episode of care (HCC) 07/21/2012   Acute osteomyelitis involving ankle and foot (HCC) 02/06/2015   Acute systolic heart failure (HCC) 07/21/2012   New onset 07/19/12; admission to [name]. Elevated Troponins.  S/p 2D-echo with EF 20-25%.  S/p cardiac catheterization with stenting LAD.  Repeat 2D-echo 10/2011 with improved EF of 35%.    Anemia     Automatic implantable cardioverter-defibrillator in situ    a. MDT CRT-D 06/2014, SN: [id number] H  .removed in feb 2020   CAD (coronary artery disease)    a. cardiac cath 101/04/2012: PCI/DES to chronically occluded mLAD, consideration PCI to diag branch in 4 weeks.    Cataract    Chicken pox    Chronic kidney disease    Chronic systolic CHF (congestive heart failure) (HCC)    a. mixed ICM & NICM; b. EF 20-25% by echo 07/2012, mid-dist 2/3 of LV sev HK/AK, mild MR. echo 10/2012: EF 30-35%, sev HK ant-septal & inf walls, GR1DD, mild MR, PASP 33. c. echo 02/2013: EF 30%, GR1DD, mild MR. echo 04/2014: EF 30%, Septal-lat dyssynchrony, global HK, inf AK, GR1DD, mild MR. d. echo 10/2014: EF50-55%, WM nl, GR1DD, septal mild paradox. e. echo 02/2015: EF 50-55%, wm    Depression    Heart attack (HCC)    Heel ulcer (HCC) 04/27/2015   History of blood transfusion ~ 2011   "plasma; had neuropathy; couldn't walk"   Hypertension    LBBB (left bundle branch block)    Neuromuscular disorder (HCC)    Neuropathy 2011   Obesity, unspecified    OSA on CPAP    Moderate with AHI 23/hr and now on CPAP at 16cm H2O.  Her DME is [facility]   Pure hypercholesterolemia    Sepsis (HCC) 09/2018   Type II diabetes mellitus (HCC)    Unspecified vitamin D deficiency  SOCIAL HX:  Social History   Tobacco Use   Smoking status: Never Smoker   Smokeless tobacco: Never Used  Substance Use Topics   Alcohol use: No    Alcohol/week: 0.0 standard drinks    ALLERGIES: No Known Allergies   PERTINENT MEDICATIONS:  Outpatient Encounter Medications as of 04/19/2019  Medication Sig   amiodarone (PACERONE) 200 MG tablet Take 400 mg twice a day x 7 days then 200 mg twice a day then 200 mg daiy   aspirin EC 81 MG EC tablet Take 1 tablet (81 mg total) by mouth daily.   Calcium Carbonate-Vitamin D3 (CALCIUM 600-D) 600-400 MG-UNIT TABS Take 1 tablet by mouth 2 (two) times a day.   clopidogrel (PLAVIX) 75 MG tablet  Take 1 tablet (75 mg total) by mouth daily. (Patient not taking: Reported on 04/15/2019)   docusate sodium (COLACE) 100 MG capsule Take 1 capsule (100 mg total) by mouth 2 (two) times daily.   ferrous sulfate 325 (65 FE) MG tablet Take 325 mg by mouth 2 (two) times daily with a meal.   gabapentin (NEURONTIN) 600 MG tablet Take 1 tablet (600 mg total) by mouth 2 (two) times daily. Takes 1 in the morning and two at bedtime   glucose blood (ONE TOUCH ULTRA TEST) test strip USE AS DIRECTED TO CHECK BLOOD SUGAR 3 TIMES DAILY   insulin glargine (LANTUS) 100 UNIT/ML injection Inject 0.16 mLs (16 Units total) into the skin at bedtime.   Multiple Vitamin (MULTIVITAMIN) tablet Take 1 tablet by mouth daily.   Omega-3 Fatty Acids (FISH OIL) 1000 MG CAPS Take 1 capsule by mouth 2 (two) times a day.    potassium chloride (K-DUR) 20 MEQ tablet Take 1 tablet (20 mEq total) by mouth 2 (two) times daily.   rosuvastatin (CRESTOR) 10 MG tablet Take 1 tablet (10 mg total) by mouth daily.   torsemide (DEMADEX) 20 MG tablet Take 2 tablets (40 mg) in the AM and 1 tabler (20mg ) in the PM   traMADol (ULTRAM) 50 MG tablet Take 1 tablet (50 mg total) by mouth every 6 (six) hours as needed.   No facility-administered encounter medications on file as of 04/19/2019.     PHYSICAL EXAM:  Deferred  Deroy Noah Z Jaleyah Longhi, NP

## 2019-04-19 NOTE — Telephone Encounter (Signed)
.   In the past 7 to 10 days have you had a cough, shortness of breath, headache, congestion, fever (100 or greater) body aches, chills, sore throat, or sudden loss of taste or sense of smell? NO . Have you been around anyone with known Covid 19? NO . Have you been around anyone who is awaiting Covid 19 test results in the past 7 to 10 days? NO . Have you been around anyone who has been exposed to Covid 19, or has mentioned symptoms of Covid 19 within the past 7 to 10 days? NO . If you have any concerns/questions about symptoms patients report during screening (either on the phone or at threshold). Contact the provider seeing the patient or DOD for further guidance. If neither are available contact a member of the leadership team."   

## 2019-04-20 ENCOUNTER — Other Ambulatory Visit: Payer: Self-pay

## 2019-04-20 ENCOUNTER — Encounter: Payer: Self-pay | Admitting: Internal Medicine

## 2019-04-20 ENCOUNTER — Encounter: Payer: Self-pay | Admitting: Family Medicine

## 2019-04-20 ENCOUNTER — Ambulatory Visit (INDEPENDENT_AMBULATORY_CARE_PROVIDER_SITE_OTHER): Payer: PPO | Admitting: Internal Medicine

## 2019-04-20 VITALS — BP 120/60 | HR 72 | Ht 68.0 in | Wt 268.0 lb

## 2019-04-20 DIAGNOSIS — I4729 Other ventricular tachycardia: Secondary | ICD-10-CM

## 2019-04-20 DIAGNOSIS — Z95810 Presence of automatic (implantable) cardiac defibrillator: Secondary | ICD-10-CM | POA: Diagnosis not present

## 2019-04-20 DIAGNOSIS — I472 Ventricular tachycardia: Secondary | ICD-10-CM | POA: Diagnosis not present

## 2019-04-20 DIAGNOSIS — I471 Supraventricular tachycardia: Secondary | ICD-10-CM | POA: Diagnosis not present

## 2019-04-20 DIAGNOSIS — Z79899 Other long term (current) drug therapy: Secondary | ICD-10-CM

## 2019-04-20 DIAGNOSIS — I5042 Chronic combined systolic (congestive) and diastolic (congestive) heart failure: Secondary | ICD-10-CM

## 2019-04-20 DIAGNOSIS — I447 Left bundle-branch block, unspecified: Secondary | ICD-10-CM | POA: Diagnosis not present

## 2019-04-20 DIAGNOSIS — I428 Other cardiomyopathies: Secondary | ICD-10-CM | POA: Diagnosis not present

## 2019-04-20 LAB — CUP PACEART INCLINIC DEVICE CHECK
Battery Remaining Longevity: 90 mo
Battery Voltage: 3.04 V
Brady Statistic AP VP Percent: 0.55 %
Brady Statistic AP VS Percent: 0.02 %
Brady Statistic AS VP Percent: 97.78 %
Brady Statistic AS VS Percent: 1.65 %
Brady Statistic RA Percent Paced: 0.57 %
Brady Statistic RV Percent Paced: 5.26 %
Date Time Interrogation Session: 20200714105120
HighPow Impedance: 42 Ohm
Implantable Lead Implant Date: 20200625
Implantable Lead Implant Date: 20200625
Implantable Lead Implant Date: 20200625
Implantable Lead Location: 753858
Implantable Lead Location: 753859
Implantable Lead Location: 753860
Implantable Lead Model: 4398
Implantable Lead Model: 5076
Implantable Lead Model: 6935
Implantable Pulse Generator Implant Date: 20200625
Lead Channel Impedance Value: 166.25 Ohm
Lead Channel Impedance Value: 169.459
Lead Channel Impedance Value: 175.385
Lead Channel Impedance Value: 204.14 Ohm
Lead Channel Impedance Value: 218.087
Lead Channel Impedance Value: 285 Ohm
Lead Channel Impedance Value: 342 Ohm
Lead Channel Impedance Value: 399 Ohm
Lead Channel Impedance Value: 418 Ohm
Lead Channel Impedance Value: 456 Ohm
Lead Channel Impedance Value: 456 Ohm
Lead Channel Impedance Value: 456 Ohm
Lead Channel Impedance Value: 589 Ohm
Lead Channel Impedance Value: 608 Ohm
Lead Channel Impedance Value: 646 Ohm
Lead Channel Impedance Value: 665 Ohm
Lead Channel Impedance Value: 722 Ohm
Lead Channel Impedance Value: 722 Ohm
Lead Channel Pacing Threshold Amplitude: 1.125 V
Lead Channel Pacing Threshold Amplitude: 1.125 V
Lead Channel Pacing Threshold Amplitude: 2 V
Lead Channel Pacing Threshold Pulse Width: 0.4 ms
Lead Channel Pacing Threshold Pulse Width: 0.4 ms
Lead Channel Pacing Threshold Pulse Width: 1 ms
Lead Channel Sensing Intrinsic Amplitude: 0.75 mV
Lead Channel Sensing Intrinsic Amplitude: 0.875 mV
Lead Channel Sensing Intrinsic Amplitude: 8.125 mV
Lead Channel Sensing Intrinsic Amplitude: 8.625 mV
Lead Channel Setting Pacing Amplitude: 2.5 V
Lead Channel Setting Pacing Amplitude: 3.5 V
Lead Channel Setting Pacing Amplitude: 4.25 V
Lead Channel Setting Pacing Pulse Width: 0.4 ms
Lead Channel Setting Pacing Pulse Width: 1.2 ms
Lead Channel Setting Sensing Sensitivity: 0.3 mV

## 2019-04-20 MED ORDER — AMOXICILLIN 500 MG PO CAPS: ORAL_CAPSULE | ORAL | 0 refills | Status: DC

## 2019-04-20 NOTE — Patient Instructions (Addendum)
Medication Instructions:  - Your physician has recommended you make the following change in your medication:   1) Demadex (torsemide) 20 mg: - take 4 tablets (80 mg) by mouth once daily in the morning X 3 days, then - take 4 tablets (80 mg) once every other day alternating with 2 tablets (40 mg) once every other day x 10 days - then resume your normal dosing at 2 tablets (40 mg) in the morning & 1 tablet (60 mg) in the afternoon  2) Start amoxicillin 500 mg- take 1 capsule by mouth daily x 7 days  If you need a refill on your cardiac medications before your next appointment, please call your pharmacy.   Lab work: - Your physician recommends that you have lab work today: BMP  If you have labs (blood work) drawn today and your tests are completely normal, you will receive your results only by: Marland Kitchen MyChart Message (if you have MyChart) OR . A paper copy in the mail If you have any lab test that is abnormal or we need to change your treatment, we will call you to review the results.  Testing/Procedures: - none ordered  Follow-Up: At [facility], you and your health needs are our priority.  As part of our continuing mission to provide you with exceptional heart care, we have created designated Provider Care Teams.  These Care Teams include your primary Cardiologist (physician) and Advanced Practice Providers (APPs -  Physician Assistants and Nurse Practitioners) who all work together to provide you with the care you need, when you need it. . Thursday 7/16 with Dr. Caryl Comes at 9:00 am (for pressure wrap to be applied)  Any Other Special Instructions Will Be Listed Below (If Applicable). - N/A

## 2019-04-20 NOTE — Progress Notes (Signed)
Review    Electrophysiology Office Note   Date:  04/20/2019   ID:  CEIL RODERICK, DOB October 05, 1956, MRN [id number]  PCP:  Rutherford Guys, MD  Cardiologist:  CHF Primary Electrophysiologist:    Virl Axe, MD    Chief Complaint  Patient presents with  . other    ICD wound recheck.Medications verbally reviews.     History of Present Illness: [name] is a 63 y.o. female  seen today for follow-up of CRT-D implantation undertaken 9/15. 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 There was marked interval improvement of LV function as noted below.  She then developed MSSA bacteremia treated initially with antibiotics with recurrence of subsequently transferred to [facility] where she underwent aspiration of tricuspid valve thrombus and device extraction.  5/20 hospitalized for heart failure.  6/20 hospitalized for urosepsis with negative blood cultures.  Because of deteriorating LV function, she underwent CRT reimplantation by Dr. Carlyn Reichert 6/20 following hospitalization for heart failure and inotropic support.  She is much improved with considerably less dyspnea..  She has significant peripheral edema weights are about the same.   Date Cr K TSH Hgb  7/17 1.53 5.0    8/18 1.96 5.4    7/19   1.46(4/19) 12.3  10/19 2.00 4.5    1/20 1.6 5.0  7.9 (Ferritin 501)  7/20 3.57>>2.41 3.0>>3.8  9.7     DATE TEST EF   9/15 echo 30 %   1 /17 echo 50 %   12/19 Echo 45-50%   12/19 TEE  No vegetations  12/19 LHC  pLAD80 with ISR; mLAD70 pCx 80;D1 90 mRCA 40  3/20 Echo  40%   6/20 Echo  10-15% PA press 45  BAE    6/20 RHC  Pa 57/20, PCW 27, RA 16 CI 2.54   ECG preop left bundle QRS 168, postop upright QRS lead V1 QRSd    Past Medical History:  Diagnosis Date  . Acute myocardial infarction, subendocardial infarction, initial episode of care ([place]) 07/21/2012  . Acute osteomyelitis involving ankle and foot ([place]) 02/06/2015  .  Acute systolic heart failure ([place]) 07/21/2012   New onset 07/19/12; admission to [facility]. Elevated Troponins.  S/p 2D-echo with EF 20-25%.  S/p cardiac catheterization with stenting LAD.  Repeat 2D-echo 10/2011 with improved EF of 35%.   . Anemia   . Automatic implantable cardioverter-defibrillator in situ    a. MDT CRT-D 06/2014, SN: [id number]  .removed in feb 2020  . CAD (coronary artery disease)    a. cardiac cath 101/04/2012: PCI/DES to chronically occluded mLAD, consideration PCI to diag branch in 4 weeks.   . Cataract   . Chicken pox   . Chronic kidney disease   . Chronic systolic CHF (congestive heart failure) (HCC)    a. mixed ICM & NICM; b. EF 20-25% by echo 07/2012, mid-dist 2/3 of LV sev HK/AK, mild MR. echo 10/2012: EF 30-35%, sev HK ant-septal & inf walls, GR1DD, mild MR, PASP 33. c. echo 02/2013: EF 30%, GR1DD, mild MR. echo 04/2014: EF 30%, Septal-lat dyssynchrony, global HK, inf AK, GR1DD, mild MR. d. echo 10/2014: EF50-55%, WM nl, GR1DD, septal mild paradox. e. echo 02/2015: EF 50-55%, wm   . Depression   . Heart attack ([place])   . Heel ulcer ([place]) 04/27/2015  . History of blood transfusion ~ 2011   "plasma; had neuropathy; couldn't walk"  . Hypertension   . LBBB (left bundle  branch block)   . Neuromuscular disorder ([place])   . Neuropathy 2011  . Obesity, unspecified   . OSA on CPAP    Moderate with AHI 23/hr and now on CPAP at 16cm H2O.  Her DME is [facility]  . Pure hypercholesterolemia   . Sepsis ([place]) 09/2018  . Type II diabetes mellitus ([place])   . Unspecified vitamin D deficiency    Past Surgical History:  Procedure Laterality Date  . AMPUTATION TOE Right 06/18/2015   Procedure: AMPUTATION TOE;  Surgeon: Samara Deist, DPM;  Location: [facility] ORS;  Service: Podiatry;  Laterality: Right;  . AMPUTATION TOE Left 10/08/2018   Procedure: AMPUTATION TOE LEFT 2ND;  Surgeon: Albertine Patricia, DPM;  Location: [facility] ORS;  Service: Podiatry;  Laterality: Left;  . BACK SURGERY    .  BI-VENTRICULAR IMPLANTABLE CARDIOVERTER DEFIBRILLATOR N/A 07/06/2014   Procedure: BI-VENTRICULAR IMPLANTABLE CARDIOVERTER DEFIBRILLATOR  (CRT-D);  Surgeon: Deboraha Sprang, MD;  Location: [facility] CATH LAB;  Service: Cardiovascular;  Laterality: N/A;  . BI-VENTRICULAR IMPLANTABLE CARDIOVERTER DEFIBRILLATOR  (CRT-D)  07/06/2014  . BILATERAL OOPHORECTOMY  01/2011   ovarian cyst benign  . BIV ICD INSERTION CRT-D N/A 03/31/2019   Procedure: BIV ICD INSERTION CRT-D;  Surgeon: Constance Haw, MD;  Location: [place] CV LAB;  Service: Cardiovascular;  Laterality: N/A;  . COLONOSCOPY WITH PROPOFOL Left 02/22/2015   Procedure: COLONOSCOPY WITH PROPOFOL;  Surgeon: Hulen Luster, MD;  Location: [facility] ENDOSCOPY;  Service: Endoscopy;  Laterality: Left;  . CORONARY ANGIOPLASTY WITH STENT PLACEMENT Left 07/2012   new onset systolic CHF; elevated troponins.  Cardiac catheterization with stenting to LAD; EF 15%.  2D-echo: EF 20-25%.  . ESOPHAGOGASTRODUODENOSCOPY N/A 02/22/2015   Procedure: ESOPHAGOGASTRODUODENOSCOPY (EGD);  Surgeon: Hulen Luster, MD;  Location: [facility] ENDOSCOPY;  Service: Endoscopy;  Laterality: N/A;  . INCISION AND DRAINAGE ABSCESS Right 2007   groin; with ICU stay due to sepsis.  Marland Kitchen LAPAROSCOPIC CHOLECYSTECTOMY  2011  . LEFT HEART CATH AND CORONARY ANGIOGRAPHY N/A 10/05/2018   Procedure: LEFT HEART CATH AND CORONARY ANGIOGRAPHY;  Surgeon: Minna Merritts, MD;  Location: [place] CV LAB;  Service: Cardiovascular;  Laterality: N/A;  . LEFT HEART CATHETERIZATION WITH CORONARY ANGIOGRAM N/A 07/21/2012   Procedure: LEFT HEART CATHETERIZATION WITH CORONARY ANGIOGRAM;  Surgeon: Jolaine Artist, MD;  Location: [facility] CATH LAB;  Service: Cardiovascular;  Laterality: N/A;  . [place]   L4-5  . PACEMAKER REMOVAL  11/2018   due to infection around pacemaker  . PERCUTANEOUS CORONARY STENT INTERVENTION (PCI-S) N/A 07/23/2012   Procedure: PERCUTANEOUS CORONARY STENT INTERVENTION (PCI-S);   Surgeon: Sherren Mocha, MD;  Location: [facility] CATH LAB;  Service: Cardiovascular;  Laterality: N/A;  . PERIPHERAL VASCULAR BALLOON ANGIOPLASTY Left 10/06/2018   Procedure: PERIPHERAL VASCULAR BALLOON ANGIOPLASTY;  Surgeon: Katha Cabal, MD;  Location: [place] CV LAB;  Service: Cardiovascular;  Laterality: Left;  . PERIPHERAL VASCULAR CATHETERIZATION N/A 02/10/2015   Procedure: Picc Line Insertion;  Surgeon: Katha Cabal, MD;  Location: [place] CV LAB;  Service: Cardiovascular;  Laterality: N/A;  . RIGHT HEART CATH N/A 03/29/2019   Procedure: RIGHT HEART CATH;  Surgeon: Jolaine Artist, MD;  Location: [place] CV LAB;  Service: Cardiovascular;  Laterality: N/A;  . TEE WITHOUT CARDIOVERSION N/A 10/02/2018   Procedure: TRANSESOPHAGEAL ECHOCARDIOGRAM (TEE);  Surgeon: Wellington Hampshire, MD;  Location: [facility] ORS;  Service: Cardiovascular;  Laterality: N/A;  . [place]  . VAGINAL HYSTERECTOMY  01/2011  Fibroids/DUB.  Ovaries removed. [place].     Current Outpatient Medications  Medication Sig Dispense Refill  . amiodarone (PACERONE) 200 MG tablet Take 400 mg twice a day x 7 days then 200 mg twice a day then 200 mg daiy 80 tablet 6  . aspirin EC 81 MG EC tablet Take 1 tablet (81 mg total) by mouth daily. 30 tablet 0  . Calcium Carbonate-Vitamin D3 (CALCIUM 600-D) 600-400 MG-UNIT TABS Take 1 tablet by mouth 2 (two) times a day.    . clopidogrel (PLAVIX) 75 MG tablet Take 1 tablet (75 mg total) by mouth daily. 90 tablet 0  . docusate sodium (COLACE) 100 MG capsule Take 1 capsule (100 mg total) by mouth 2 (two) times daily. 60 capsule 3  . ferrous sulfate 325 (65 FE) MG tablet Take 325 mg by mouth 2 (two) times daily with a meal.    . gabapentin (NEURONTIN) 600 MG tablet Take 1 tablet (600 mg total) by mouth 2 (two) times daily. Takes 1 in the morning and two at bedtime 180 tablet 1  . glucose blood (ONE TOUCH ULTRA TEST) test strip USE AS DIRECTED TO CHECK BLOOD  SUGAR 3 TIMES DAILY 300 each 11  . insulin glargine (LANTUS) 100 UNIT/ML injection Inject 0.16 mLs (16 Units total) into the skin at bedtime. 10 mL 11  . Multiple Vitamin (MULTIVITAMIN) tablet Take 1 tablet by mouth daily.    . Omega-3 Fatty Acids (FISH OIL) 1000 MG CAPS Take 1 capsule by mouth 2 (two) times a day.     . potassium chloride (K-DUR) 20 MEQ tablet Take 1 tablet (20 mEq total) by mouth 2 (two) times daily. 180 tablet 1  . rosuvastatin (CRESTOR) 10 MG tablet Take 1 tablet (10 mg total) by mouth daily. 90 tablet 3  . torsemide (DEMADEX) 20 MG tablet Take 2 tablets (40 mg) in the AM and 1 tabler (20mg ) in the PM 540 tablet 3  . traMADol (ULTRAM) 50 MG tablet Take 1 tablet (50 mg total) by mouth every 6 (six) hours as needed. 120 tablet 5   No current facility-administered medications for this visit.     Allergies:   Patient has no known allergies.   Social History:  The patient  reports that she has never smoked. She has never used smokeless tobacco. She reports that she does not drink alcohol or use drugs.   Family History:  The patient's family history includes Arthritis in her mother; COPD in her maternal grandmother; Cancer in her father and maternal grandfather; Cancer (age of onset: 59) in her brother; Diabetes in her maternal grandmother, mother, paternal grandfather, paternal grandmother, and son; Heart disease in her maternal grandmother; Hypertension in her mother and son; Obesity in her brother.    ROS:  Please see the history of present illness.   .   All other systems are reviewed and negative.    PHYSICAL EXAM: VS:  BP 120/60 (BP Location: Left Arm, Patient Position: Sitting, Cuff Size: Normal)   Pulse 72   Ht 5\' 8"  (1.727 m)   Wt 268 lb (121.6 kg)   BMI 40.75 kg/m  , BMI Body mass index is 40.75 kg/m. Well developed and well nourished in no acute distress HENT normal Neck supple with JVP-flat Clear Device pocket well healed; with significant hematoma but no  erythema.  There is no tethering  Regular rate and rhythm, no  gallop No  murmur Abd-soft with active BS No Clubbing cyanosis 3+ edema Skin-warm  and dry A & Oriented  Grossly normal sensory and motor function  ECG sinus rhythm with P synchronous pacing.  Initial recording of V4-LV 3 QRS upright lead V1 qR in lead I Reprogrammed 1-coil and 1-4 upright QRS lead V1 negative QRS V1 LV early by 40 ms QRS duration shortened 160 ms AV delay-sensed 120 ms      Device interrogation is reviewed today in detail.  See PaceArt for details.   Recent Labs: 03/22/2019: TSH 17.240 03/23/2019: ALT 22 03/30/2019: Magnesium 2.2 04/14/2019: B Natriuretic Peptide 1,604.1; BUN 76; Creatinine, Ser 2.41; Hemoglobin 9.7; Platelets 199; Potassium 3.8; Sodium 143    Lipid Panel     Component Value Date/Time   CHOL 82 (L) 02/03/2019 1611   TRIG 101 02/03/2019 1611   HDL 37 (L) 02/03/2019 1611   CHOLHDL 2.2 02/03/2019 1611   CHOLHDL 5.6 (H) 08/06/2016 1431   VLDL 34 (H) 08/06/2016 1431   LDLCALC 25 02/03/2019 1611     Wt Readings from Last 3 Encounters:  04/20/19 268 lb (121.6 kg)  04/15/19 271 lb (122.9 kg)  04/14/19 275 lb 8 oz (125 kg)      Other studies Reviewed:    ASSESSMENT AND PLAN: Ischemic cardiomyopathy-interval normalization   CRT-D    CHF chronic diastolic  Fatigue  Low blood pressure  Sleep apnea-untreated   Renal Insufficiency    Patient is considerably better.  We have reprogrammed her device to move a little bit more towards the mid wall as electrode #4 was very proximal on x-ray i.e. basilar.  Reprogramming to use electrode 1 as the cathode able to improve the ECG configuration.  QRS duration stayed about the same.  She is significantly volume overloaded.  Hospital stay was associated with worsening creatinine with then some improvement.  We will try and diurese her aggressively over the next couple of days and recheck a metabolic profile today.  She has ongoing  low-grade anemia.  Per PCP.  Significant pocket hematoma.  Not significantly changed and will apply a pressure pal vest  More than 50% of 40 min was spent in counseling related to the above   Signed, Virl Axe, MD  04/20/2019 9:30 AM     [facility] [address] [place] [address] [phone] (office) [phone] (fax)

## 2019-04-21 ENCOUNTER — Telehealth: Payer: Self-pay | Admitting: Internal Medicine

## 2019-04-21 LAB — BASIC METABOLIC PANEL
BUN/Creatinine Ratio: 23 (ref 12–28)
BUN: 50 mg/dL — ABNORMAL HIGH (ref 8–27)
CO2: 28 mmol/L (ref 20–29)
Calcium: 9.8 mg/dL (ref 8.7–10.3)
Chloride: 99 mmol/L (ref 96–106)
Creatinine, Ser: 2.15 mg/dL — ABNORMAL HIGH (ref 0.57–1.00)
GFR calc Af Amer: 27 mL/min/{1.73_m2} — ABNORMAL LOW (ref >59)
GFR calc non Af Amer: 23 mL/min/{1.73_m2} — ABNORMAL LOW (ref >59)
Glucose: 110 mg/dL — ABNORMAL HIGH (ref 65–99)
Potassium: 4.9 mmol/L (ref 3.5–5.2)
Sodium: 142 mmol/L (ref 134–144)

## 2019-04-21 NOTE — Telephone Encounter (Signed)
Home health nurse calling in to speak with nurse about patient

## 2019-04-21 NOTE — Telephone Encounter (Signed)
Spoke with [place] and she states that patient has a wound which she needs orders. Advised that she should reach out to her primary care provider for those orders. She states that she has been trying to reach PCP but when calling their number it states "we are currently experiencing system issues". She is not sure how to reach her from that. Advised that I would route this message over to their office and see if someone could reach out to her. Her name is Modena Slater LPN with [facility] and her number is [phone] and she did say that it was a secure line and that they could leave a voicemail message for her since she is seeing other patients today. She was very appreciative for the help in reaching out to her PCP office for further assistance. No further questions at this time.

## 2019-04-22 ENCOUNTER — Encounter: Payer: PPO | Admitting: Internal Medicine

## 2019-04-23 ENCOUNTER — Telehealth: Payer: Self-pay | Admitting: Internal Medicine

## 2019-04-23 ENCOUNTER — Telehealth: Payer: Self-pay | Admitting: *Deleted

## 2019-04-23 NOTE — Telephone Encounter (Signed)
   TELEPHONE CALL NOTE  This patient has been deemed a candidate for follow-up tele-health visit to limit community exposure during the Covid-19 pandemic. I spoke with the patient via phone to discuss instructions. This has been outlined on the patient's AVS (dotphrase: [email or web address]). The patient was advised to review the section on consent for treatment as well. The patient will receive a phone call 2-3 days prior to their E-Visit at which time consent will be verbally confirmed.   A Virtual Office Visit appointment type has been scheduled for 7/20 with TT, with "VIDEO" or "TELEPHONE" in the appointment notes - patient prefers VIDEO type.   Marolyn Hammock, [place] 04/23/2019 9:47 AM

## 2019-04-23 NOTE — Telephone Encounter (Signed)
Left VM with my cell phone for call back

## 2019-04-23 NOTE — Telephone Encounter (Signed)
I spoke to the patient about her swelling at the site of her pacemaker.  She said that Dr Caryl Comes was going to wrap it on Saturday, because he did not have it while in the office Thursday.  She was wondering if he still planned to come out?

## 2019-04-23 NOTE — Telephone Encounter (Signed)
Patient calling States that Dr Caryl Comes was to get her something for her pressure and they may be coming out Saturday to do it  She would like to know if she can wait til she sees Dr Caryl Comes on Tuesday 7/21 Please call to discuss

## 2019-04-23 NOTE — Telephone Encounter (Signed)
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 Routine care instructions. Call back if worsening or not improving.

## 2019-04-23 NOTE — Telephone Encounter (Signed)
I informed patient that Dr Caryl Comes will be out there Saturday.  She thanked me for the call.

## 2019-04-23 NOTE — Telephone Encounter (Signed)
YES

## 2019-04-25 NOTE — Progress Notes (Signed)
Virtual Visit via Telephone Note   This visit type was conducted due to national recommendations for restrictions regarding the COVID-19 Pandemic (e.g. social distancing) in an effort to limit this patient's exposure and mitigate transmission in our community.  Due to her co-morbid illnesses, this patient is at least at moderate risk for complications without adequate follow up.  This format is felt to be most appropriate for this patient at this time.  All issues noted in this document were discussed and addressed.  A limited physical exam was performed with this format.  Please refer to the patient's chart for her consent to telehealth for [facility].   Evaluation Performed:  Follow-up visit  This visit type was conducted due to national recommendations for restrictions regarding the COVID-19 Pandemic (e.g. social distancing).  This format is felt to be most appropriate for this patient at this time.  All issues noted in this document were discussed and addressed.  No physical exam was performed (except for noted visual exam findings with Video Visits).  Please refer to the patient's chart (MyChart message for video visits and phone note for telephone visits) for the patient's consent to telehealth for [facility].  Date:  04/26/2019   ID:  [name], DOB Oct 16, 1955, MRN [id number]  Patient Location:  Home  Provider location:   [place]  PCP:  Rutherford Guys, MD  Cardiologist:  Ida Rogue, MD  Sleep Medicine:  Fransico Him, MD Electrophysiologist:  Virl Axe, MD   Chief Complaint:  OSA  History of Present Illness:    [name] is a 63 y.o. female who presents via audio/video conferencing for a telehealth visit today.    This is a 63yo female with a history of ASCAD, CKD, chronic systolic CHF and Moderate OSA with AHI 23/hr and now on CPAP at 16cm H2O.  PSG showed moderate OSA with an overall AHI of 23 events per hour and 87 events per hour during REM  sleep. She had reduced sleep efficiency with increased frequency of arousals from respiratory events and moderate snoring. She underwent CPAP titration to 16cm H2O. Her DME is [facility].    When I saw her last she had not used her device in 2 years and I ordered a new mask and started on auto PAP.  She is now here for followup.  She was in the hospital for a pacer and was told that her nasal pillow mask was not doing well at keeping her apnea under control and apparently she was holding her mouth open and her O2 dropped and now she is scared to use it for fear her mask is not working right.  She does not want to the a nasal or nasal pillow mask because she is worried that it wont do her any good.   She feels the pressure is adequate.  Since going on CPAP she feels rested in the am and has no significant daytime sleepiness.  She denies any significant mouth or nasal dryness or nasal congestion.  She does not think that he snores.   The patient does not have symptoms concerning for COVID-19 infection (fever, chills, cough, or new shortness of breath).    Prior CV studies:   The following studies were reviewed today:  PAP compliance download  Past Medical History:  Diagnosis Date   Acute myocardial infarction, subendocardial infarction, initial episode of care ([place]) 07/21/2012   Acute osteomyelitis involving ankle and foot ([place]) 04/11/8114   Acute systolic heart  failure ([place]) 07/21/2012   New onset 07/19/12; admission to [facility] ED. Elevated Troponins.  S/p 2D-echo with EF 20-25%.  S/p cardiac catheterization with stenting LAD.  Repeat 2D-echo 10/2011 with improved EF of 35%.    Anemia    Automatic implantable cardioverter-defibrillator in situ    a. MDT CRT-D 06/2014, SN: [id number]  .removed in feb 2020   CAD (coronary artery disease)    a. cardiac cath 101/04/2012: PCI/DES to chronically occluded mLAD, consideration PCI to diag branch in 4 weeks.    Cataract    Chicken pox    Chronic  kidney disease    Chronic systolic CHF (congestive heart failure) (HCC)    a. mixed ICM & NICM; b. EF 20-25% by echo 07/2012, mid-dist 2/3 of LV sev HK/AK, mild MR. echo 10/2012: EF 30-35%, sev HK ant-septal & inf walls, GR1DD, mild MR, PASP 33. c. echo 02/2013: EF 30%, GR1DD, mild MR. echo 04/2014: EF 30%, Septal-lat dyssynchrony, global HK, inf AK, GR1DD, mild MR. d. echo 10/2014: EF50-55%, WM nl, GR1DD, septal mild paradox. e. echo 02/2015: EF 50-55%, wm    Depression    Heart attack ([place])    Heel ulcer ([place]) 04/27/2015   History of blood transfusion ~ 2011   "plasma; had neuropathy; couldn't walk"   Hypertension    LBBB (left bundle branch block)    Neuromuscular disorder ([place])    Neuropathy 2011   Obesity, unspecified    OSA on CPAP    Moderate with AHI 23/hr and now on CPAP at 16cm H2O.  Her DME is [facility]   Pure hypercholesterolemia    Sepsis ([place]) 09/2018   Type II diabetes mellitus ([place])    Unspecified vitamin D deficiency    Past Surgical History:  Procedure Laterality Date   AMPUTATION TOE Right 06/18/2015   Procedure: AMPUTATION TOE;  Surgeon: Samara Deist, DPM;  Location: [facility] ORS;  Service: Podiatry;  Laterality: Right;   AMPUTATION TOE Left 10/08/2018   Procedure: AMPUTATION TOE LEFT 2ND;  Surgeon: Albertine Patricia, DPM;  Location: [facility] ORS;  Service: Podiatry;  Laterality: Left;   BACK SURGERY     BI-VENTRICULAR IMPLANTABLE CARDIOVERTER DEFIBRILLATOR N/A 07/06/2014   Procedure: BI-VENTRICULAR IMPLANTABLE CARDIOVERTER DEFIBRILLATOR  (CRT-D);  Surgeon: Deboraha Sprang, MD;  Location: [facility] CATH LAB;  Service: Cardiovascular;  Laterality: N/A;   BI-VENTRICULAR IMPLANTABLE CARDIOVERTER DEFIBRILLATOR  (CRT-D)  07/06/2014   BILATERAL OOPHORECTOMY  01/2011   ovarian cyst benign   BIV ICD INSERTION CRT-D N/A 03/31/2019   Procedure: BIV ICD INSERTION CRT-D;  Surgeon: Constance Haw, MD;  Location: [place] CV LAB;  Service: Cardiovascular;  Laterality: N/A;    COLONOSCOPY WITH PROPOFOL Left 02/22/2015   Procedure: COLONOSCOPY WITH PROPOFOL;  Surgeon: Hulen Luster, MD;  Location: [facility] ENDOSCOPY;  Service: Endoscopy;  Laterality: Left;   CORONARY ANGIOPLASTY WITH STENT PLACEMENT Left 07/2012   new onset systolic CHF; elevated troponins.  Cardiac catheterization with stenting to LAD; EF 15%.  2D-echo: EF 20-25%.   ESOPHAGOGASTRODUODENOSCOPY N/A 02/22/2015   Procedure: ESOPHAGOGASTRODUODENOSCOPY (EGD);  Surgeon: Hulen Luster, MD;  Location: [facility] ENDOSCOPY;  Service: Endoscopy;  Laterality: N/A;   INCISION AND DRAINAGE ABSCESS Right 2007   groin; with ICU stay due to sepsis.   LAPAROSCOPIC CHOLECYSTECTOMY  2011   LEFT HEART CATH AND CORONARY ANGIOGRAPHY N/A 10/05/2018   Procedure: LEFT HEART CATH AND CORONARY ANGIOGRAPHY;  Surgeon: Minna Merritts, MD;  Location: [place] CV LAB;  Service: Cardiovascular;  Laterality: N/A;  LEFT HEART CATHETERIZATION WITH CORONARY ANGIOGRAM N/A 07/21/2012   Procedure: LEFT HEART CATHETERIZATION WITH CORONARY ANGIOGRAM;  Surgeon: Jolaine Artist, MD;  Location: [facility] CATH LAB;  Service: Cardiovascular;  Laterality: N/A;   LUMBAR [place]   L4-5   PACEMAKER REMOVAL  11/2018   due to infection around pacemaker   [place] (PCI-S) N/A 07/23/2012   Procedure: PERCUTANEOUS CORONARY STENT INTERVENTION (PCI-S);  Surgeon: Sherren Mocha, MD;  Location: [facility] CATH LAB;  Service: Cardiovascular;  Laterality: N/A;   PERIPHERAL VASCULAR BALLOON ANGIOPLASTY Left 10/06/2018   Procedure: PERIPHERAL VASCULAR BALLOON ANGIOPLASTY;  Surgeon: Katha Cabal, MD;  Location: [place] CV LAB;  Service: Cardiovascular;  Laterality: Left;   PERIPHERAL VASCULAR CATHETERIZATION N/A 02/10/2015   Procedure: Picc Line Insertion;  Surgeon: Katha Cabal, MD;  Location: [place] CV LAB;  Service: Cardiovascular;  Laterality: N/A;   RIGHT HEART CATH N/A 03/29/2019   Procedure: RIGHT HEART  CATH;  Surgeon: Jolaine Artist, MD;  Location: [place] CV LAB;  Service: Cardiovascular;  Laterality: N/A;   TEE WITHOUT CARDIOVERSION N/A 10/02/2018   Procedure: TRANSESOPHAGEAL ECHOCARDIOGRAM (TEE);  Surgeon: Wellington Hampshire, MD;  Location: [facility] ORS;  Service: Cardiovascular;  Laterality: N/A;   [place]  01/2011   Fibroids/DUB.  Ovaries removed. [place].     Current Meds  Medication Sig   amiodarone (PACERONE) 200 MG tablet Take 400 mg twice a day x 7 days then 200 mg twice a day then 200 mg daiy   aspirin EC 81 MG EC tablet Take 1 tablet (81 mg total) by mouth daily.   Calcium Carbonate-Vitamin D3 (CALCIUM 600-D) 600-400 MG-UNIT TABS Take 1 tablet by mouth 2 (two) times a day.   clopidogrel (PLAVIX) 75 MG tablet Take 1 tablet (75 mg total) by mouth daily.   docusate sodium (COLACE) 100 MG capsule Take 1 capsule (100 mg total) by mouth 2 (two) times daily.   ferrous sulfate 325 (65 FE) MG tablet Take 325 mg by mouth 2 (two) times daily with a meal.   gabapentin (NEURONTIN) 600 MG tablet Take 1 tablet (600 mg total) by mouth 2 (two) times daily. Takes 1 in the morning and two at bedtime   glucose blood (ONE TOUCH ULTRA TEST) test strip USE AS DIRECTED TO CHECK BLOOD SUGAR 3 TIMES DAILY   insulin glargine (LANTUS) 100 UNIT/ML injection Inject 0.16 mLs (16 Units total) into the skin at bedtime.   Multiple Vitamin (MULTIVITAMIN) tablet Take 1 tablet by mouth daily.   Omega-3 Fatty Acids (FISH OIL) 1000 MG CAPS Take 1 capsule by mouth 2 (two) times a day.    potassium chloride (K-DUR) 20 MEQ tablet Take 1 tablet (20 mEq total) by mouth 2 (two) times daily.   rosuvastatin (CRESTOR) 10 MG tablet Take 1 tablet (10 mg total) by mouth daily.   torsemide (DEMADEX) 20 MG tablet Take 2 tablets (40 mg) in the AM and 1 tabler (20mg ) in the PM (Patient taking differently: Take 1 tablets (20 mg) in the AM and 2 tabler (40mg ) in the PM)    traMADol (ULTRAM) 50 MG tablet Take 1 tablet (50 mg total) by mouth every 6 (six) hours as needed. (Patient taking differently: Take 50 mg by mouth every 6 (six) hours as needed for moderate pain. )     Allergies:   Patient has no known allergies.   Social History   Tobacco Use  Smoking status: Never Smoker   Smokeless tobacco: Never Used  Substance Use Topics   Alcohol use: No    Alcohol/week: 0.0 standard drinks   Drug use: No     Family Hx: The patient's family history includes Arthritis in her mother; COPD in her maternal grandmother; Cancer in her father and maternal grandfather; Cancer (age of onset: 59) in her brother; Diabetes in her maternal grandmother, mother, paternal grandfather, paternal grandmother, and son; Heart disease in her maternal grandmother; Hypertension in her mother and son; Obesity in her brother.  ROS:   Please see the history of present illness.     All other systems reviewed and are negative.   Labs/Other Tests and Data Reviewed:    Recent Labs: 03/22/2019: TSH 17.240 03/23/2019: ALT 22 03/30/2019: Magnesium 2.2 04/14/2019: B Natriuretic Peptide 1,604.1; Hemoglobin 9.7; Platelets 199 04/20/2019: BUN 50; Creatinine, Ser 2.15; Potassium 4.9; Sodium 142   Recent Lipid Panel Lab Results  Component Value Date/Time   CHOL 82 (L) 02/03/2019 04:11 PM   TRIG 101 02/03/2019 04:11 PM   HDL 37 (L) 02/03/2019 04:11 PM   CHOLHDL 2.2 02/03/2019 04:11 PM   CHOLHDL 5.6 (H) 08/06/2016 02:31 PM   LDLCALC 25 02/03/2019 04:11 PM    Wt Readings from Last 3 Encounters:  04/26/19 261 lb (118.4 kg)  04/20/19 268 lb (121.6 kg)  04/15/19 271 lb (122.9 kg)     Objective:    Vital Signs:  BP 114/74    Pulse 69    Ht 5\' 8"  (1.727 m)    Wt 261 lb (118.4 kg)    BMI 39.68 kg/m    ASSESSMENT & PLAN:    1.  OSA - the patient is tolerating PAP therapy well without any problems. The patient has been using and benefiting from PAP use and will continue to benefit from  therapy.She has not been using her device due to the mask not working and she is breathing throught her mouth.   I am going to change her to a Respironics Dreamwear under the nose full face mask and place on auto CPAP along with an overnight pulse ox to make sure she is not having any O2 desats.  2.  Hypertension -her BP is controlled at home -she has not required any antihypertensive meds  3.  Morbid  Obesity  -I have encouraged she to get into a routine exercise program and cut back on carbs and portions.   COVID-19 Education: The signs and symptoms of COVID-19 were discussed with the patient and how to seek care for testing (follow up with PCP or arrange E-visit).  The importance of social distancing was discussed today.  Patient Risk:   After full review of this patient's clinical status, I feel that they are at least moderate risk at this time.  Time:   Today, I have spent 15 minutes on telemedicine phone visit discussing medical problems including OSA, HTN, obesity.  We also reviewed the symptoms of COVID 19 and the ways to protect against contracting the virus with telehealth technology.  I spent an additional 5 minutes reviewing patient's chart including PAP compliance data .  Medication Adjustments/Labs and Tests Ordered: Current medicines are reviewed at length with the patient today.  Concerns regarding medicines are outlined above.  Tests Ordered: No orders of the defined types were placed in this encounter.  Medication Changes: No orders of the defined types were placed in this encounter.   Disposition:  Follow up in 3 months  virtual  Signed, Fransico Him, MD  04/26/2019 1:53 PM    [place]

## 2019-04-26 ENCOUNTER — Telehealth: Payer: Self-pay

## 2019-04-26 ENCOUNTER — Telehealth (INDEPENDENT_AMBULATORY_CARE_PROVIDER_SITE_OTHER): Payer: PPO | Admitting: Cardiology

## 2019-04-26 ENCOUNTER — Other Ambulatory Visit: Payer: Self-pay

## 2019-04-26 ENCOUNTER — Encounter: Payer: Self-pay | Admitting: Cardiology

## 2019-04-26 ENCOUNTER — Telehealth: Payer: Self-pay | Admitting: Family Medicine

## 2019-04-26 VITALS — BP 114/74 | HR 69 | Ht 68.0 in | Wt 261.0 lb

## 2019-04-26 DIAGNOSIS — G4733 Obstructive sleep apnea (adult) (pediatric): Secondary | ICD-10-CM | POA: Diagnosis not present

## 2019-04-26 DIAGNOSIS — I129 Hypertensive chronic kidney disease with stage 1 through stage 4 chronic kidney disease, or unspecified chronic kidney disease: Secondary | ICD-10-CM | POA: Diagnosis not present

## 2019-04-26 DIAGNOSIS — I10 Essential (primary) hypertension: Secondary | ICD-10-CM

## 2019-04-26 DIAGNOSIS — E1122 Type 2 diabetes mellitus with diabetic chronic kidney disease: Secondary | ICD-10-CM | POA: Diagnosis not present

## 2019-04-26 DIAGNOSIS — N184 Chronic kidney disease, stage 4 (severe): Secondary | ICD-10-CM | POA: Diagnosis not present

## 2019-04-26 DIAGNOSIS — E875 Hyperkalemia: Secondary | ICD-10-CM | POA: Diagnosis not present

## 2019-04-26 NOTE — Patient Instructions (Signed)
Your physician recommends that you continue on your current medications as directed. Please refer to the Current Medication list given to you today.  RESPIRONICS DREAMWEAR  DOWNLOAD 2 WEEKS  PT NEEDS OVERNIGHT PULSE OX ON AUTO CPAP  Your physician recommends that you schedule a follow-up appointment in:  3 MONTH VIRTUAL VISIT

## 2019-04-26 NOTE — Telephone Encounter (Signed)
Pt's health cert for completed faxed 04/23/19 to [phone]

## 2019-04-26 NOTE — Telephone Encounter (Signed)
Copied from [place] [id number]. Topic: General - Other >> Apr 26, 2019 11:01 AM Nils Flack wrote: Reason for CRM: Pt is requesting call back about paperwork.  Please call [phone]

## 2019-04-27 ENCOUNTER — Encounter: Payer: PPO | Admitting: Internal Medicine

## 2019-04-27 ENCOUNTER — Encounter: Payer: Self-pay | Admitting: Internal Medicine

## 2019-04-27 ENCOUNTER — Ambulatory Visit (INDEPENDENT_AMBULATORY_CARE_PROVIDER_SITE_OTHER): Payer: PPO | Admitting: Internal Medicine

## 2019-04-27 ENCOUNTER — Other Ambulatory Visit: Payer: Self-pay

## 2019-04-27 VITALS — BP 118/60 | HR 78 | Ht 68.0 in | Wt 262.0 lb

## 2019-04-27 DIAGNOSIS — G4733 Obstructive sleep apnea (adult) (pediatric): Secondary | ICD-10-CM | POA: Diagnosis not present

## 2019-04-27 DIAGNOSIS — Z95810 Presence of automatic (implantable) cardiac defibrillator: Secondary | ICD-10-CM

## 2019-04-27 DIAGNOSIS — A492 Hemophilus influenzae infection, unspecified site: Secondary | ICD-10-CM | POA: Diagnosis not present

## 2019-04-27 DIAGNOSIS — I428 Other cardiomyopathies: Secondary | ICD-10-CM

## 2019-04-27 DIAGNOSIS — J189 Pneumonia, unspecified organism: Secondary | ICD-10-CM | POA: Diagnosis not present

## 2019-04-27 DIAGNOSIS — I5022 Chronic systolic (congestive) heart failure: Secondary | ICD-10-CM | POA: Diagnosis not present

## 2019-04-27 DIAGNOSIS — I472 Ventricular tachycardia: Secondary | ICD-10-CM

## 2019-04-27 DIAGNOSIS — Z79899 Other long term (current) drug therapy: Secondary | ICD-10-CM

## 2019-04-27 DIAGNOSIS — I4729 Other ventricular tachycardia: Secondary | ICD-10-CM

## 2019-04-27 DIAGNOSIS — I5042 Chronic combined systolic (congestive) and diastolic (congestive) heart failure: Secondary | ICD-10-CM

## 2019-04-27 DIAGNOSIS — I480 Paroxysmal atrial fibrillation: Secondary | ICD-10-CM

## 2019-04-27 NOTE — Progress Notes (Signed)
Patient Care Team: Rutherford Guys, MD as PCP - General (Family Medicine) Minna Merritts, MD as PCP - Cardiology (Cardiology) Deboraha Sprang, MD as PCP - Electrophysiology (Cardiology) Bensimhon, Shaune Pascal, MD as PCP - Advanced Heart Failure (Cardiology) Calvert Cantor, MD as Consulting Physician (Ophthalmology) Lavonia Dana, MD as Consulting Physician (Internal Medicine) Elvina Mattes, Adele Schilder as Attending Physician (Podiatry) Tsosie Billing, MD as Consulting Physician (Infectious Diseases)   HPI  [name] is a 63 y.o. female Seen in follow-up for pocket hematoma related to a CRT-D implantation 03/31/2019.  Seen last week and anticipated the application of her Pressure Pal Vest; finally got that done on Saturday.    Records and Results Reviewed   Past Medical History:  Diagnosis Date  . Acute myocardial infarction, subendocardial infarction, initial episode of care ([place]) 07/21/2012  . Acute osteomyelitis involving ankle and foot ([place]) 02/06/2015  . Acute systolic heart failure ([place]) 07/21/2012   New onset 07/19/12; admission to [facility] ED. Elevated Troponins.  S/p 2D-echo with EF 20-25%.  S/p cardiac catheterization with stenting LAD.  Repeat 2D-echo 10/2011 with improved EF of 35%.   . Anemia   . Automatic implantable cardioverter-defibrillator in situ    a. MDT CRT-D 06/2014, SN: [id number]  .removed in feb 2020  . CAD (coronary artery disease)    a. cardiac cath 101/04/2012: PCI/DES to chronically occluded mLAD, consideration PCI to diag branch in 4 weeks.   . Cataract   . Chicken pox   . Chronic kidney disease   . Chronic systolic CHF (congestive heart failure) (HCC)    a. mixed ICM & NICM; b. EF 20-25% by echo 07/2012, mid-dist 2/3 of LV sev HK/AK, mild MR. echo 10/2012: EF 30-35%, sev HK ant-septal & inf walls, GR1DD, mild MR, PASP 33. c. echo 02/2013: EF 30%, GR1DD, mild MR. echo 04/2014: EF 30%, Septal-lat dyssynchrony, global HK, inf AK, GR1DD,  mild MR. d. echo 10/2014: EF50-55%, WM nl, GR1DD, septal mild paradox. e. echo 02/2015: EF 50-55%, wm   . Depression   . Heart attack ([place])   . Heel ulcer ([place]) 04/27/2015  . History of blood transfusion ~ 2011   "plasma; had neuropathy; couldn't walk"  . Hypertension   . LBBB (left bundle branch block)   . Neuromuscular disorder ([place])   . Neuropathy 2011  . Obesity, unspecified   . OSA on CPAP    Moderate with AHI 23/hr and now on CPAP at 16cm H2O.  Her DME is [facility]  . Pure hypercholesterolemia   . Sepsis ([place]) 09/2018  . Type II diabetes mellitus ([place])   . Unspecified vitamin D deficiency     Past Surgical History:  Procedure Laterality Date  . AMPUTATION TOE Right 06/18/2015   Procedure: AMPUTATION TOE;  Surgeon: Samara Deist, DPM;  Location: [facility] ORS;  Service: Podiatry;  Laterality: Right;  . AMPUTATION TOE Left 10/08/2018   Procedure: AMPUTATION TOE LEFT 2ND;  Surgeon: Albertine Patricia, DPM;  Location: [facility] ORS;  Service: Podiatry;  Laterality: Left;  . BACK SURGERY    . BI-VENTRICULAR IMPLANTABLE CARDIOVERTER DEFIBRILLATOR N/A 07/06/2014   Procedure: BI-VENTRICULAR IMPLANTABLE CARDIOVERTER DEFIBRILLATOR  (CRT-D);  Surgeon: Deboraha Sprang, MD;  Location: [facility] CATH LAB;  Service: Cardiovascular;  Laterality: N/A;  . BI-VENTRICULAR IMPLANTABLE CARDIOVERTER DEFIBRILLATOR  (CRT-D)  07/06/2014  . BILATERAL OOPHORECTOMY  01/2011   ovarian cyst benign  . BIV ICD INSERTION CRT-D N/A 03/31/2019   Procedure: BIV ICD INSERTION CRT-D;  Surgeon: Constance Haw, MD;  Location: [place] CV LAB;  Service: Cardiovascular;  Laterality: N/A;  . COLONOSCOPY WITH PROPOFOL Left 02/22/2015   Procedure: COLONOSCOPY WITH PROPOFOL;  Surgeon: Hulen Luster, MD;  Location: [facility] ENDOSCOPY;  Service: Endoscopy;  Laterality: Left;  . CORONARY ANGIOPLASTY WITH STENT PLACEMENT Left 07/2012   new onset systolic CHF; elevated troponins.  Cardiac catheterization with stenting to LAD; EF 15%.  2D-echo: EF 20-25%.  .  ESOPHAGOGASTRODUODENOSCOPY N/A 02/22/2015   Procedure: ESOPHAGOGASTRODUODENOSCOPY (EGD);  Surgeon: Hulen Luster, MD;  Location: [facility] ENDOSCOPY;  Service: Endoscopy;  Laterality: N/A;  . INCISION AND DRAINAGE ABSCESS Right 2007   groin; with ICU stay due to sepsis.  Marland Kitchen LAPAROSCOPIC CHOLECYSTECTOMY  2011  . LEFT HEART CATH AND CORONARY ANGIOGRAPHY N/A 10/05/2018   Procedure: LEFT HEART CATH AND CORONARY ANGIOGRAPHY;  Surgeon: Minna Merritts, MD;  Location: [place] CV LAB;  Service: Cardiovascular;  Laterality: N/A;  . LEFT HEART CATHETERIZATION WITH CORONARY ANGIOGRAM N/A 07/21/2012   Procedure: LEFT HEART CATHETERIZATION WITH CORONARY ANGIOGRAM;  Surgeon: Jolaine Artist, MD;  Location: [facility] CATH LAB;  Service: Cardiovascular;  Laterality: N/A;  . [place]   L4-5  . PACEMAKER REMOVAL  11/2018   due to infection around pacemaker  . PERCUTANEOUS CORONARY STENT INTERVENTION (PCI-S) N/A 07/23/2012   Procedure: PERCUTANEOUS CORONARY STENT INTERVENTION (PCI-S);  Surgeon: Sherren Mocha, MD;  Location: [facility] CATH LAB;  Service: Cardiovascular;  Laterality: N/A;  . PERIPHERAL VASCULAR BALLOON ANGIOPLASTY Left 10/06/2018   Procedure: PERIPHERAL VASCULAR BALLOON ANGIOPLASTY;  Surgeon: Katha Cabal, MD;  Location: [place] CV LAB;  Service: Cardiovascular;  Laterality: Left;  . PERIPHERAL VASCULAR CATHETERIZATION N/A 02/10/2015   Procedure: Picc Line Insertion;  Surgeon: Katha Cabal, MD;  Location: [place] CV LAB;  Service: Cardiovascular;  Laterality: N/A;  . RIGHT HEART CATH N/A 03/29/2019   Procedure: RIGHT HEART CATH;  Surgeon: Jolaine Artist, MD;  Location: [place] CV LAB;  Service: Cardiovascular;  Laterality: N/A;  . TEE WITHOUT CARDIOVERSION N/A 10/02/2018   Procedure: TRANSESOPHAGEAL ECHOCARDIOGRAM (TEE);  Surgeon: Wellington Hampshire, MD;  Location: [facility] ORS;  Service: Cardiovascular;  Laterality: N/A;  . [place]  . VAGINAL HYSTERECTOMY   01/2011   Fibroids/DUB.  Ovaries removed. [place].    Current Meds  Medication Sig  . amiodarone (PACERONE) 200 MG tablet Take 400 mg twice a day x 7 days then 200 mg twice a day then 200 mg daiy  . aspirin EC 81 MG EC tablet Take 1 tablet (81 mg total) by mouth daily.  . Calcium Carbonate-Vitamin D3 (CALCIUM 600-D) 600-400 MG-UNIT TABS Take 1 tablet by mouth 2 (two) times a day.  . clopidogrel (PLAVIX) 75 MG tablet Take 1 tablet (75 mg total) by mouth daily.  Marland Kitchen docusate sodium (COLACE) 100 MG capsule Take 1 capsule (100 mg total) by mouth 2 (two) times daily.  . ferrous sulfate 325 (65 FE) MG tablet Take 325 mg by mouth 2 (two) times daily with a meal.  . gabapentin (NEURONTIN) 600 MG tablet Take 1 tablet (600 mg total) by mouth 2 (two) times daily. Takes 1 in the morning and two at bedtime  . glucose blood (ONE TOUCH ULTRA TEST) test strip USE AS DIRECTED TO CHECK BLOOD SUGAR 3 TIMES DAILY  . insulin glargine (LANTUS) 100 UNIT/ML injection Inject 0.16 mLs (16 Units total) into the skin at bedtime.  . Multiple Vitamin (MULTIVITAMIN) tablet  Take 1 tablet by mouth daily.  . Omega-3 Fatty Acids (FISH OIL) 1000 MG CAPS Take 1 capsule by mouth 2 (two) times a day.   . potassium chloride (K-DUR) 20 MEQ tablet Take 1 tablet (20 mEq total) by mouth 2 (two) times daily.  . rosuvastatin (CRESTOR) 10 MG tablet Take 1 tablet (10 mg total) by mouth daily.  Marland Kitchen torsemide (DEMADEX) 20 MG tablet Take 2 tablets (40 mg) in the AM and 1 tabler (20mg ) in the PM (Patient taking differently: Take 1 tablets (20 mg) in the AM and 2 tabler (40mg ) in the PM)  . traMADol (ULTRAM) 50 MG tablet Take 1 tablet (50 mg total) by mouth every 6 (six) hours as needed. (Patient taking differently: Take 50 mg by mouth every 6 (six) hours as needed for moderate pain. )    No Known Allergies    Review of Systems negative except from HPI and PMH  Physical Exam BP 118/60 (BP Location: Right Arm, Patient Position: Sitting, Cuff  Size: Normal)   Pulse 78   Ht 5\' 8"  (1.727 m)   Wt 262 lb (118.8 kg)   SpO2 98%   BMI 39.84 kg/m  Well developed and well nourished in no acute distress   Hematoma somewhat smaller.  Also a little bit soft.  Skin discoloration is turning green.   Assessment and  Plan  CRT-D recent reimplantation following extraction  Nonischemic myopathy  Pocket hematoma   Pocket hematoma will continue to wear the Pressure Pal Vest for the next week and then will check  Current medicines are reviewed at length with the patient today .  The patient does not  have concerns regarding medicines.

## 2019-04-27 NOTE — Patient Instructions (Signed)
Medication Instructions:  - Your physician recommends that you continue on your current medications as directed. Please refer to the Current Medication list given to you today.  If you need a refill on your cardiac medications before your next appointment, please call your pharmacy.   Lab work: - none ordered  If you have labs (blood work) drawn today and your tests are completely normal, you will receive your results only by: Marland Kitchen MyChart Message (if you have MyChart) OR . A paper copy in the mail If you have any lab test that is abnormal or we need to change your treatment, we will call you to review the results.  Testing/Procedures: - none ordered  Follow-Up: At [facility], you and your health needs are our priority.  As part of our continuing mission to provide you with exceptional heart care, we have created designated Provider Care Teams.  These Care Teams include your primary Cardiologist (physician) and Advanced Practice Providers (APPs -  Physician Assistants and Nurse Practitioners) who all work together to provide you with the care you need, when you need it. . in 1 week with Dr. Caryl Comes (Tuesday 05/04/2019 @ 8:40 am)  Any Other Special Instructions Will Be Listed Below (If Applicable). - N/A

## 2019-04-28 NOTE — Telephone Encounter (Signed)
FYI Dr Keenan Bachelor with patient. She stated the form we filled out last week was incomplete. Question #10 need to e complete or signed off on her therapy part. Patient was informed to have form sent back into our office and put a note on it on the area that need to be completed and we will see about getting this taking care of. FYI this form will be faxed back by insurance company.

## 2019-04-28 NOTE — Telephone Encounter (Signed)
Pt is calling and insurance form was not completely done and pt would like a callback

## 2019-04-30 ENCOUNTER — Telehealth: Payer: Self-pay | Admitting: *Deleted

## 2019-04-30 DIAGNOSIS — I5042 Chronic combined systolic (congestive) and diastolic (congestive) heart failure: Secondary | ICD-10-CM

## 2019-04-30 DIAGNOSIS — Z79899 Other long term (current) drug therapy: Secondary | ICD-10-CM

## 2019-04-30 NOTE — Telephone Encounter (Signed)
Can you please followup on this and have Dr Nolon Rod fix the form. Please feel free to call me if there are questions. Thanks

## 2019-04-30 NOTE — Telephone Encounter (Signed)
-----   Message from Deboraha Sprang, MD sent at 04/30/2019  3:48 PM EDT ----- Please Inform Patient that previous abnormality in Cr  is improved Could we check again in about 4 weeks plz  Thanks

## 2019-04-30 NOTE — Telephone Encounter (Signed)
Results called to pt. Pt verbalized understanding. She is aware to go to [place] around 8/24 for repeat lab work. BMET order entered.

## 2019-05-03 ENCOUNTER — Telehealth: Payer: Self-pay | Admitting: Family Medicine

## 2019-05-03 ENCOUNTER — Telehealth (HOSPITAL_COMMUNITY): Payer: Self-pay

## 2019-05-03 ENCOUNTER — Telehealth: Payer: Self-pay | Admitting: *Deleted

## 2019-05-03 DIAGNOSIS — Z89421 Acquired absence of other right toe(s): Secondary | ICD-10-CM | POA: Diagnosis not present

## 2019-05-03 DIAGNOSIS — E1122 Type 2 diabetes mellitus with diabetic chronic kidney disease: Secondary | ICD-10-CM | POA: Diagnosis not present

## 2019-05-03 DIAGNOSIS — F329 Major depressive disorder, single episode, unspecified: Secondary | ICD-10-CM | POA: Diagnosis not present

## 2019-05-03 DIAGNOSIS — Z87440 Personal history of urinary (tract) infections: Secondary | ICD-10-CM | POA: Diagnosis not present

## 2019-05-03 DIAGNOSIS — I5023 Acute on chronic systolic (congestive) heart failure: Secondary | ICD-10-CM | POA: Diagnosis not present

## 2019-05-03 DIAGNOSIS — N189 Chronic kidney disease, unspecified: Secondary | ICD-10-CM | POA: Diagnosis not present

## 2019-05-03 DIAGNOSIS — Z89422 Acquired absence of other left toe(s): Secondary | ICD-10-CM | POA: Diagnosis not present

## 2019-05-03 DIAGNOSIS — G4733 Obstructive sleep apnea (adult) (pediatric): Secondary | ICD-10-CM

## 2019-05-03 DIAGNOSIS — R238 Other skin changes: Secondary | ICD-10-CM | POA: Diagnosis not present

## 2019-05-03 DIAGNOSIS — I2690 Septic pulmonary embolism without acute cor pulmonale: Secondary | ICD-10-CM | POA: Diagnosis not present

## 2019-05-03 DIAGNOSIS — Z5181 Encounter for therapeutic drug level monitoring: Secondary | ICD-10-CM | POA: Diagnosis not present

## 2019-05-03 DIAGNOSIS — I2510 Atherosclerotic heart disease of native coronary artery without angina pectoris: Secondary | ICD-10-CM | POA: Diagnosis not present

## 2019-05-03 DIAGNOSIS — Z95820 Peripheral vascular angioplasty status with implants and grafts: Secondary | ICD-10-CM | POA: Diagnosis not present

## 2019-05-03 DIAGNOSIS — Z48812 Encounter for surgical aftercare following surgery on the circulatory system: Secondary | ICD-10-CM | POA: Diagnosis not present

## 2019-05-03 DIAGNOSIS — I447 Left bundle-branch block, unspecified: Secondary | ICD-10-CM | POA: Diagnosis not present

## 2019-05-03 DIAGNOSIS — Z950 Presence of cardiac pacemaker: Secondary | ICD-10-CM | POA: Diagnosis not present

## 2019-05-03 DIAGNOSIS — D509 Iron deficiency anemia, unspecified: Secondary | ICD-10-CM | POA: Diagnosis not present

## 2019-05-03 DIAGNOSIS — I130 Hypertensive heart and chronic kidney disease with heart failure and stage 1 through stage 4 chronic kidney disease, or unspecified chronic kidney disease: Secondary | ICD-10-CM | POA: Diagnosis not present

## 2019-05-03 DIAGNOSIS — E559 Vitamin D deficiency, unspecified: Secondary | ICD-10-CM | POA: Diagnosis not present

## 2019-05-03 DIAGNOSIS — I255 Ischemic cardiomyopathy: Secondary | ICD-10-CM | POA: Diagnosis not present

## 2019-05-03 DIAGNOSIS — I252 Old myocardial infarction: Secondary | ICD-10-CM | POA: Diagnosis not present

## 2019-05-03 DIAGNOSIS — Z955 Presence of coronary angioplasty implant and graft: Secondary | ICD-10-CM | POA: Diagnosis not present

## 2019-05-03 DIAGNOSIS — E1142 Type 2 diabetes mellitus with diabetic polyneuropathy: Secondary | ICD-10-CM | POA: Diagnosis not present

## 2019-05-03 DIAGNOSIS — Z8631 Personal history of diabetic foot ulcer: Secondary | ICD-10-CM | POA: Diagnosis not present

## 2019-05-03 NOTE — Telephone Encounter (Signed)
Attempted to contact-no answer left message.   [place] [phone]

## 2019-05-03 NOTE — Telephone Encounter (Signed)
Copied from [place] [id number]. Topic: Quick Communication - Home Health Verbal Orders >> May 03, 2019  3:14 PM Erick Blinks wrote: Caller/Agency: [facility] Number: [phone] Requesting OT/PT/Skilled Nursing/Social Work/Speech Therapy: [place] pt requesting orders to cleanse and dress blister wound on pt's foot. .3x0.5x0.3 cm in size. Shallow but needs attention

## 2019-05-03 NOTE — Telephone Encounter (Signed)
-----   Message from [name] Campbell, LPN sent at 2/69/4854  2:29 PM EDT -----    1 Van Clines. [place] Female, 63 y.o., 1955-12-11 MRN:  [id number] Phone:  [phone] Jerilynn Mages) PCP:  Rutherford Guys, MD Primary Cvg:  [employer] Ppo Next Appt With Cardiology Virl Axe, MD) 04/27/2019 at 11:20 AM  Message Received: Today Message Contents  Turner, Eber Hong, MD  [name] Campbell, LPN    Order a Respironics Dreamwear under the nose full face mask and get a download in 2 weeks. Get overnight pulse ox on auto CPAP. PER PT DOES NOT KNOW HOW TO DOWNLOAD OR IF EQUIPMENT CAPABLE . [place]

## 2019-05-03 NOTE — Telephone Encounter (Signed)
order placed to [facility] via community message today.

## 2019-05-04 ENCOUNTER — Other Ambulatory Visit: Payer: Self-pay

## 2019-05-04 ENCOUNTER — Ambulatory Visit (INDEPENDENT_AMBULATORY_CARE_PROVIDER_SITE_OTHER): Payer: PPO | Admitting: Internal Medicine

## 2019-05-04 ENCOUNTER — Encounter: Payer: Self-pay | Admitting: Internal Medicine

## 2019-05-04 DIAGNOSIS — I5022 Chronic systolic (congestive) heart failure: Secondary | ICD-10-CM

## 2019-05-04 DIAGNOSIS — I4729 Other ventricular tachycardia: Secondary | ICD-10-CM

## 2019-05-04 DIAGNOSIS — I472 Ventricular tachycardia: Secondary | ICD-10-CM

## 2019-05-04 DIAGNOSIS — Z95810 Presence of automatic (implantable) cardiac defibrillator: Secondary | ICD-10-CM

## 2019-05-04 DIAGNOSIS — I428 Other cardiomyopathies: Secondary | ICD-10-CM

## 2019-05-04 NOTE — Patient Instructions (Signed)
Continue wearing your Pocket Pal vest.   Follow-up with Dr. Caryl Comes in [place] on 05/18/19 at 9:00am.   Call the [facility] at [phone] if your hematoma becomes larger or more firm prior to this appointment.

## 2019-05-04 NOTE — Progress Notes (Signed)
Wound check  Hematoma smaller but firm, incision well healed Will continue with pressure pal and recheck n 2 weeks

## 2019-05-05 ENCOUNTER — Telehealth (HOSPITAL_COMMUNITY): Payer: Self-pay

## 2019-05-05 DIAGNOSIS — M86171 Other acute osteomyelitis, right ankle and foot: Secondary | ICD-10-CM | POA: Diagnosis not present

## 2019-05-05 DIAGNOSIS — J189 Pneumonia, unspecified organism: Secondary | ICD-10-CM | POA: Diagnosis not present

## 2019-05-05 DIAGNOSIS — I5022 Chronic systolic (congestive) heart failure: Secondary | ICD-10-CM | POA: Diagnosis not present

## 2019-05-05 DIAGNOSIS — G4733 Obstructive sleep apnea (adult) (pediatric): Secondary | ICD-10-CM | POA: Diagnosis not present

## 2019-05-05 NOTE — Telephone Encounter (Signed)
Verbal orders have been given, along with the request for images.  Thanks

## 2019-05-05 NOTE — Telephone Encounter (Signed)
Please give verbal order to clean and dress blister wound on pt's foot. .3x0.5x0.3 cm in size and document with images if possible.

## 2019-05-05 NOTE — Telephone Encounter (Signed)
Weight 254 lbs bgl: 147.  Today was a telephone appt, she states been doing well.  She has been losing weight, she figures it is still fluid coming off.  She states swelling is much better.  She is off her oxygen and breathing has been good.  She has senn several doctors and reports have been good.  She has all her medications and watches her diet. Medications verified.   She denies chest pain, headaches or dizziness.  She will be moving in about a month or two.  Will contact [employer] program to switch her.  She weighs daily and aware to let someone know if weight gain of 3 lbs or more over night or 5 lbs in a week.  Will continue to visit for heart failure.   [place] [phone]

## 2019-05-07 DIAGNOSIS — G4733 Obstructive sleep apnea (adult) (pediatric): Secondary | ICD-10-CM | POA: Diagnosis not present

## 2019-05-10 ENCOUNTER — Ambulatory Visit (HOSPITAL_COMMUNITY)
Admission: RE | Admit: 2019-05-10 | Discharge: 2019-05-10 | Disposition: A | Discharge status: Home / Self Care | Payer: PPO | Source: Ambulatory Visit | Attending: Cardiology | Admitting: Cardiology

## 2019-05-10 ENCOUNTER — Encounter (HOSPITAL_COMMUNITY): Payer: Self-pay

## 2019-05-10 ENCOUNTER — Other Ambulatory Visit: Payer: Self-pay

## 2019-05-10 VITALS — BP 120/68 | HR 74 | Wt 258.0 lb

## 2019-05-10 DIAGNOSIS — Z794 Long term (current) use of insulin: Secondary | ICD-10-CM | POA: Diagnosis not present

## 2019-05-10 DIAGNOSIS — Z79899 Other long term (current) drug therapy: Secondary | ICD-10-CM | POA: Insufficient documentation

## 2019-05-10 DIAGNOSIS — E1142 Type 2 diabetes mellitus with diabetic polyneuropathy: Secondary | ICD-10-CM | POA: Diagnosis not present

## 2019-05-10 DIAGNOSIS — I428 Other cardiomyopathies: Secondary | ICD-10-CM | POA: Diagnosis not present

## 2019-05-10 DIAGNOSIS — Z6839 Body mass index (BMI) 39.0-39.9, adult: Secondary | ICD-10-CM | POA: Diagnosis not present

## 2019-05-10 DIAGNOSIS — Z7902 Long term (current) use of antithrombotics/antiplatelets: Secondary | ICD-10-CM | POA: Insufficient documentation

## 2019-05-10 DIAGNOSIS — Z8249 Family history of ischemic heart disease and other diseases of the circulatory system: Secondary | ICD-10-CM | POA: Diagnosis not present

## 2019-05-10 DIAGNOSIS — I493 Ventricular premature depolarization: Secondary | ICD-10-CM | POA: Diagnosis not present

## 2019-05-10 DIAGNOSIS — I2510 Atherosclerotic heart disease of native coronary artery without angina pectoris: Secondary | ICD-10-CM | POA: Diagnosis not present

## 2019-05-10 DIAGNOSIS — G4733 Obstructive sleep apnea (adult) (pediatric): Secondary | ICD-10-CM | POA: Diagnosis not present

## 2019-05-10 DIAGNOSIS — Z89421 Acquired absence of other right toe(s): Secondary | ICD-10-CM | POA: Insufficient documentation

## 2019-05-10 DIAGNOSIS — Z89422 Acquired absence of other left toe(s): Secondary | ICD-10-CM | POA: Insufficient documentation

## 2019-05-10 DIAGNOSIS — I447 Left bundle-branch block, unspecified: Secondary | ICD-10-CM | POA: Insufficient documentation

## 2019-05-10 DIAGNOSIS — E1122 Type 2 diabetes mellitus with diabetic chronic kidney disease: Secondary | ICD-10-CM | POA: Diagnosis not present

## 2019-05-10 DIAGNOSIS — Z955 Presence of coronary angioplasty implant and graft: Secondary | ICD-10-CM | POA: Insufficient documentation

## 2019-05-10 DIAGNOSIS — I509 Heart failure, unspecified: Secondary | ICD-10-CM | POA: Diagnosis not present

## 2019-05-10 DIAGNOSIS — N183 Chronic kidney disease, stage 3 unspecified: Secondary | ICD-10-CM

## 2019-05-10 DIAGNOSIS — E7800 Pure hypercholesterolemia, unspecified: Secondary | ICD-10-CM | POA: Insufficient documentation

## 2019-05-10 DIAGNOSIS — E559 Vitamin D deficiency, unspecified: Secondary | ICD-10-CM | POA: Insufficient documentation

## 2019-05-10 DIAGNOSIS — I5022 Chronic systolic (congestive) heart failure: Secondary | ICD-10-CM

## 2019-05-10 DIAGNOSIS — I130 Hypertensive heart and chronic kidney disease with heart failure and stage 1 through stage 4 chronic kidney disease, or unspecified chronic kidney disease: Secondary | ICD-10-CM | POA: Diagnosis not present

## 2019-05-10 DIAGNOSIS — Z833 Family history of diabetes mellitus: Secondary | ICD-10-CM | POA: Diagnosis not present

## 2019-05-10 DIAGNOSIS — Z95810 Presence of automatic (implantable) cardiac defibrillator: Secondary | ICD-10-CM | POA: Diagnosis not present

## 2019-05-10 DIAGNOSIS — E1151 Type 2 diabetes mellitus with diabetic peripheral angiopathy without gangrene: Secondary | ICD-10-CM | POA: Insufficient documentation

## 2019-05-10 DIAGNOSIS — Z8261 Family history of arthritis: Secondary | ICD-10-CM | POA: Insufficient documentation

## 2019-05-10 DIAGNOSIS — I252 Old myocardial infarction: Secondary | ICD-10-CM | POA: Diagnosis not present

## 2019-05-10 MED ORDER — HYDRALAZINE HCL 25 MG PO TABS: ORAL_TABLET | ORAL | 2 refills | Status: DC

## 2019-05-10 MED ORDER — ISOSORBIDE MONONITRATE ER 30 MG PO TB24: 30.0000 mg | ORAL_TABLET | Freq: Every day | ORAL | 11 refills | Status: DC

## 2019-05-10 NOTE — Progress Notes (Signed)
PCP: Primary Cardiologist: Dr Haroldine Laws  HPI: [name] a 63 y.o.femalewith MI>>LAD DES,LBBB, mixedNICM/ICM combinedsystolic and diastolic CHF,status post CRT-D 2015 with improvement in EF to 40 to 45%>>10-15% after ICD removed 2nd bacteremia,, DM 2, HTN, obesity,PAD w/ PTA L anterior tibial artery 10/06/2018,OSA prescribed CPAP, NSTEMI 12/2019s/p cath w/ med rx for CAD>>2/2 MSSA bacteremia s/p toe amputation, TV and lead vegetations, s/p Angiovac &ICD removal at [facility] .   Admitted on 03/23/19 with A/C biventricular HF. EF improved previously with CRT but now back down again after CRT removal due to sepsis.She had marked volume overload on exam and rising creatinine. Possibly low output. Started on IV lasix + metolazone.Placed on milrinone which was later weaned off. Unfortunately while hospitalized her husband had CVA and passed away on 2023/04/30. On 30-Apr-2023 she underwent CRT-D.  She is being discharged today with volume overload and worsening renal function, but she is anxious to go home and plan her husbands funeral. Discharge weight 287 pounds. Discharge with [facility].   Today she returns for HF follow up. Overall feeling fine. Getting stronger everyday. No longer wearing oxygen. Able to walk without the walker. Able to do house work without difficulty.  Denies SOB/PND/Orthopnea. Appetite ok. No fever or chills. Weight at home has been stable.  Taking all medications.  ROS: All systems negative except as listed in HPI, PMH and Problem List.  SH:  Social History   Socioeconomic History  . Marital status: Married    Spouse name: [name]  . Number of children: 2  . Years of education: 25  . Highest education level: High school graduate  Occupational History  . Occupation: disabled    Fish farm manager: UNEMPLOYED    Comment: 03/2010 for peripheral neuropathy  . 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  Social Needs  . Financial resource strain: Not hard at all  . Food insecurity    Worry: Never true    Inability: Never true  . Transportation needs    Medical: No    Non-medical: No  Tobacco Use  . Smoking status: Never Smoker  . Smokeless tobacco: Never Used  Substance and Sexual Activity  . Alcohol use: No    Alcohol/week: 0.0 standard drinks  . Drug use: No  . Sexual activity: Never    Birth control/protection: Post-menopausal, Surgical  Lifestyle  . Physical activity    Days per week: 0 days    Minutes per session: 0 min  . Stress: Rather much  Relationships  . Social connections    Talks on phone: More than three times a week    Gets together: More than three times a week    Attends religious service: Never    Active member of club or organization: No    Attends meetings of clubs or organizations: Never    Relationship status: Married  . Intimate partner violence    Fear of current or ex partner: No    Emotionally abused: No    Physically abused: No    Forced sexual activity: No  Other Topics Concern  . Not on file  Social History Narrative   Marital status:Married x 45 yrs. Happily married, no abuse.       Children:  2 children (daughter 31, son 70). Two grandsons and 2 step grandchildren.  1 gg.      Lives: with husband, daughter, son-in-law, 2 grandsons, and 1 on the way.      Employment: disability for peripheral neuropathy 2012.  Previously  had home daycare.      Tobacco: none      Alcohol: none      Drugs: none      Exercise: none. [employer].      Pets: dog.      Always uses seat belts, smoke detectors in home.      No guns in the home.       Caffeine use: 2 cups coffee per day.       Nutrition: Well balanced diet.    FH:  Family History  Problem Relation Age of Onset  . Diabetes Mother   . Hypertension Mother   . Arthritis Mother        knees, lumbar DDD, cervical DDD  . Cancer Father        prostate,skin,lymphoma.  . Cancer Brother 13       bladder cancer; non-smoker  . Diabetes Maternal Grandmother   . Heart  disease Maternal Grandmother   . COPD Maternal Grandmother   . Diabetes Paternal Grandmother   . Obesity Brother   . Diabetes Son   . Hypertension Son   . Cancer Maternal Grandfather   . Diabetes Paternal Grandfather     Past Medical History:  Diagnosis Date  . Acute myocardial infarction, subendocardial infarction, initial episode of care ([place]) 07/21/2012  . Acute osteomyelitis involving ankle and foot ([place]) 02/06/2015  . Acute systolic heart failure ([place]) 07/21/2012   New onset 07/19/12; admission to [facility] ED. Elevated Troponins.  S/p 2D-echo with EF 20-25%.  S/p cardiac catheterization with stenting LAD.  Repeat 2D-echo 10/2011 with improved EF of 35%.   . Anemia   . Automatic implantable cardioverter-defibrillator in situ    a. MDT CRT-D 06/2014, SN: [id number]  .removed in feb 2020  . CAD (coronary artery disease)    a. cardiac cath 101/04/2012: PCI/DES to chronically occluded mLAD, consideration PCI to diag branch in 4 weeks.   . Cataract   . Chicken pox   . Chronic kidney disease   . Chronic systolic CHF (congestive heart failure) (HCC)    a. mixed ICM & NICM; b. EF 20-25% by echo 07/2012, mid-dist 2/3 of LV sev HK/AK, mild MR. echo 10/2012: EF 30-35%, sev HK ant-septal & inf walls, GR1DD, mild MR, PASP 33. c. echo 02/2013: EF 30%, GR1DD, mild MR. echo 04/2014: EF 30%, Septal-lat dyssynchrony, global HK, inf AK, GR1DD, mild MR. d. echo 10/2014: EF50-55%, WM nl, GR1DD, septal mild paradox. e. echo 02/2015: EF 50-55%, wm   . Depression   . Heart attack ([place])   . Heel ulcer ([place]) 04/27/2015  . History of blood transfusion ~ 2011   "plasma; had neuropathy; couldn't walk"  . Hypertension   . LBBB (left bundle branch block)   . Neuromuscular disorder ([place])   . Neuropathy 2011  . Obesity, unspecified   . OSA on CPAP    Moderate with AHI 23/hr and now on CPAP at 16cm H2O.  Her DME is [facility]  . Pure hypercholesterolemia   . Sepsis ([place]) 09/2018  . Type II diabetes mellitus ([place])   .  Unspecified vitamin D deficiency     Current Outpatient Medications  Medication Sig Dispense Refill  . amiodarone (PACERONE) 200 MG tablet Take 200 mg by mouth daily.    . Calcium Carbonate-Vitamin D3 (CALCIUM 600-D) 600-400 MG-UNIT TABS Take 1 tablet by mouth 2 (two) times a day.    . clopidogrel (PLAVIX) 75 MG tablet Take 1 tablet (75 mg total)  by mouth daily. 90 tablet 0  . docusate sodium (COLACE) 100 MG capsule Take 1 capsule (100 mg total) by mouth 2 (two) times daily. 60 capsule 3  . ferrous sulfate 325 (65 FE) MG tablet Take 325 mg by mouth 2 (two) times daily with a meal.    . gabapentin (NEURONTIN) 600 MG tablet Take 1 tablet (600 mg total) by mouth 2 (two) times daily. Takes 1 in the morning and two at bedtime 180 tablet 1  . glucose blood (ONE TOUCH ULTRA TEST) test strip USE AS DIRECTED TO CHECK BLOOD SUGAR 3 TIMES DAILY 300 each 11  . insulin glargine (LANTUS) 100 UNIT/ML injection Inject 0.16 mLs (16 Units total) into the skin at bedtime. 10 mL 11  . Multiple Vitamin (MULTIVITAMIN) tablet Take 1 tablet by mouth daily.    . Omega-3 Fatty Acids (FISH OIL) 1000 MG CAPS Take 1 capsule by mouth 2 (two) times a day.     . potassium chloride (K-DUR) 20 MEQ tablet Take 1 tablet (20 mEq total) by mouth 2 (two) times daily. 180 tablet 1  . rosuvastatin (CRESTOR) 10 MG tablet Take 1 tablet (10 mg total) by mouth daily. 90 tablet 3  . torsemide (DEMADEX) 20 MG tablet Take 2 tablets (40 mg) in the AM and 1 tabler (20mg ) in the PM 540 tablet 3  . traMADol (ULTRAM) 50 MG tablet Take 1 tablet (50 mg total) by mouth every 6 (six) hours as needed. (Patient taking differently: Take 50 mg by mouth every 6 (six) hours as needed for moderate pain. ) 120 tablet 5  . hydrALAZINE (APRESOLINE) 25 MG tablet Take 1/2 tablet by mouth 3 times a day. 90 tablet 2  . isosorbide mononitrate (IMDUR) 30 MG 24 hr tablet Take 1 tablet (30 mg total) by mouth daily. 30 tablet 11   No current facility-administered  medications for this encounter.     Vitals:   05/10/19 1118  BP: 120/68  Pulse: 74  SpO2: 95%  Weight: 117 kg (258 lb)   Wt Readings from Last 3 Encounters:  05/10/19 117 kg (258 lb)  04/27/19 118.8 kg (262 lb)  04/26/19 118.4 kg (261 lb)    PHYSICAL EXAM: General:  Well appearing. No resp difficulty HEENT: normal Neck: supple. no JVD. Carotids 2+ bilat; no bruits. No lymphadenopathy or thryomegaly appreciated. Cor: PMI nondisplaced. Regular rate & rhythm. No rubs, gallops. L upper chest hematoma over ICD site.  Lungs: clear Abdomen: soft, nontender, nondistended. No hepatosplenomegaly. No bruits or masses. Good bowel sounds. Extremities: no cyanosis, clubbing, rash, edema. R and LLE varicosities.  Neuro: alert & orientedx3, cranial nerves grossly intact. moves all 4 extremities w/o difficulty. Affect pleasant     ASSESSMENT & PLAN: 1.  Chronic CHF due to mixed ICM. NICM with LBBB (152ms) - Echo 03/22/19 EF 10-15% (down from 40-45% prior to removal of CRT) - s/p CRT-D replacement 6/25 - Functional improvement. NYHA II.  - Volume status stable. Continue current diuretic regimen.  - No bb for now.  - No spiro, dig, or arb with CKD.   - Add 12.5 mg hydralazine three times a day + imdur 30 mg daily.  - Repeat ECHO the end of September.    2. Chronic CKD III   3. PAT & NSVT, frequent PVCs -Continue amiodarone 200 mg daily.  - Next visit check TSH/LFTs.   4. CAD - No s/s ischemia - last cath in 12/19 with severe LAD mid to distal LAD disease  after previous stent. No PCI options. No chest pain.  - continue statin and DAPT  5. DM2 - Lantus adjusted and now improved - cover with SSI - HgBa1c 6.7  6. H/o PAD - s/p bilateral toe amputations due to non-healing wounds  7. Morbid obesity Body mass index is 39.23 kg/m. Discussed portion control.   8. S/P CRT-D [employer]  Repeating ECHO in 2 months.   Follow up in 4 weeks.     Beau Vanduzer  NP-C  11:20 AM

## 2019-05-10 NOTE — Patient Instructions (Signed)
START Hydralazine 12.5mg . Take 1/2 tablet by mouth 3 times a day. START Imdur 30mg . Take 1 tablet by mouth daily.   Your physician recommends that you schedule a follow-up appointment in: 4 weeks  At the [facility], you and your health needs are our priority. As part of our continuing mission to provide you with exceptional heart care, we have created designated Provider Care Teams. These Care Teams include your primary Cardiologist (physician) and Advanced Practice Providers (APPs- Physician Assistants and Nurse Practitioners) who all work together to provide you with the care you need, when you need it.   You may see any of the following providers on your designated Care Team at your next follow up: Marland Kitchen Dr Glori Bickers . Dr [name] . Darrick Grinder, NP   Please be sure to bring in all your medications bottles to every appointment.

## 2019-05-17 ENCOUNTER — Telehealth (HOSPITAL_COMMUNITY): Payer: Self-pay

## 2019-05-17 NOTE — Telephone Encounter (Signed)
Weight 254 lbs.  Today had a telephone visit with [name].  She states been doing good and feels good.  She is aware of medication changes made last week and aware of taking them and for what.  She denies any problems today, especially chest pain, shortness of breath, headaches or dizziness.  Weighs daily.  She knows how to take her meds.  She is able to to place her meds in a box and take them everyday as directed.  She denies swelling or weight gain.  She watches her diet and fluids.  Meds verified.  Will continue to visit for heart failure and medication compliance.   [place] [phone]

## 2019-05-18 ENCOUNTER — Ambulatory Visit (INDEPENDENT_AMBULATORY_CARE_PROVIDER_SITE_OTHER): Payer: PPO | Admitting: Internal Medicine

## 2019-05-18 ENCOUNTER — Other Ambulatory Visit: Payer: Self-pay

## 2019-05-18 ENCOUNTER — Encounter: Payer: Self-pay | Admitting: Internal Medicine

## 2019-05-18 VITALS — BP 122/60 | HR 65 | Ht 68.0 in | Wt 252.0 lb

## 2019-05-18 DIAGNOSIS — I255 Ischemic cardiomyopathy: Secondary | ICD-10-CM

## 2019-05-18 DIAGNOSIS — Z79899 Other long term (current) drug therapy: Secondary | ICD-10-CM

## 2019-05-18 DIAGNOSIS — N183 Chronic kidney disease, stage 3 unspecified: Secondary | ICD-10-CM

## 2019-05-18 DIAGNOSIS — I493 Ventricular premature depolarization: Secondary | ICD-10-CM

## 2019-05-18 DIAGNOSIS — I5042 Chronic combined systolic (congestive) and diastolic (congestive) heart failure: Secondary | ICD-10-CM

## 2019-05-18 MED ORDER — LEVOTHYROXINE SODIUM 25 MCG PO TABS: 25.0000 ug | ORAL_TABLET | Freq: Every day | ORAL | 6 refills | Status: DC

## 2019-05-18 NOTE — Progress Notes (Signed)
Patient Care Team: Rutherford Guys, MD as PCP - General (Family Medicine) Minna Merritts, MD as PCP - Cardiology (Cardiology) Deboraha Sprang, MD as PCP - Electrophysiology (Cardiology) Bensimhon, Shaune Pascal, MD as PCP - Advanced Heart Failure (Cardiology) Calvert Cantor, MD as Consulting Physician (Ophthalmology) Lavonia Dana, MD as Consulting Physician (Internal Medicine) Elvina Mattes, Adele Schilder as Attending Physician (Podiatry) Tsosie Billing, MD as Consulting Physician (Infectious Diseases)   HPI  [name] is a 63 y.o. female Seen in follow-up for pocket hematoma related to a CRT-D implantation 03/31/2019.  Nonischemic cardiomyopathy and ventricular ectopy. During that hospitalization, she is also noted to have significant PVCs.  Amiodarone was initiated with nearly complete suppression.  Atrial tachycardia also suppressed.     Date Cr K Hgb TSH LFTs  3./20 3.54>>1.18  11 4.64  18  7/20 2.15 4.9 9.7 (6/20)17.4               No walking and able to do her daily chores.  Much better than prior to her CRT reimplantation.  No orthopnea.  Chronic left greater than right peripheral edema.  Records and Results Reviewed   Past Medical History:  Diagnosis Date  . Acute myocardial infarction, subendocardial infarction, initial episode of care ([place]) 07/21/2012  . Acute osteomyelitis involving ankle and foot ([place]) 02/06/2015  . Acute systolic heart failure ([place]) 07/21/2012   New onset 07/19/12; admission to [facility] ED. Elevated Troponins.  S/p 2D-echo with EF 20-25%.  S/p cardiac catheterization with stenting LAD.  Repeat 2D-echo 10/2011 with improved EF of 35%.   . Anemia   . Automatic implantable cardioverter-defibrillator in situ    a. MDT CRT-D 06/2014, SN: [id number]  .removed in feb 2020  . CAD (coronary artery disease)    a. cardiac cath 101/04/2012: PCI/DES to chronically occluded mLAD, consideration PCI to diag branch in 4 weeks.   . Cataract   .  Chicken pox   . Chronic kidney disease   . Chronic systolic CHF (congestive heart failure) (HCC)    a. mixed ICM & NICM; b. EF 20-25% by echo 07/2012, mid-dist 2/3 of LV sev HK/AK, mild MR. echo 10/2012: EF 30-35%, sev HK ant-septal & inf walls, GR1DD, mild MR, PASP 33. c. echo 02/2013: EF 30%, GR1DD, mild MR. echo 04/2014: EF 30%, Septal-lat dyssynchrony, global HK, inf AK, GR1DD, mild MR. d. echo 10/2014: EF50-55%, WM nl, GR1DD, septal mild paradox. e. echo 02/2015: EF 50-55%, wm   . Depression   . Heart attack ([place])   . Heel ulcer ([place]) 04/27/2015  . History of blood transfusion ~ 2011   "plasma; had neuropathy; couldn't walk"  . Hypertension   . LBBB (left bundle branch block)   . Neuromuscular disorder ([place])   . Neuropathy 2011  . Obesity, unspecified   . OSA on CPAP    Moderate with AHI 23/hr and now on CPAP at 16cm H2O.  Her DME is [facility]  . Pure hypercholesterolemia   . Sepsis ([place]) 09/2018  . Type II diabetes mellitus ([place])   . Unspecified vitamin D deficiency     Past Surgical History:  Procedure Laterality Date  . AMPUTATION TOE Right 06/18/2015   Procedure: AMPUTATION TOE;  Surgeon: Samara Deist, DPM;  Location: [facility] ORS;  Service: Podiatry;  Laterality: Right;  . AMPUTATION TOE Left 10/08/2018   Procedure: AMPUTATION TOE LEFT 2ND;  Surgeon: Albertine Patricia, DPM;  Location: [facility] ORS;  Service: Podiatry;  Laterality: Left;  .  BACK SURGERY    . BI-VENTRICULAR IMPLANTABLE CARDIOVERTER DEFIBRILLATOR N/A 07/06/2014   Procedure: BI-VENTRICULAR IMPLANTABLE CARDIOVERTER DEFIBRILLATOR  (CRT-D);  Surgeon: Deboraha Sprang, MD;  Location: [facility] CATH LAB;  Service: Cardiovascular;  Laterality: N/A;  . BI-VENTRICULAR IMPLANTABLE CARDIOVERTER DEFIBRILLATOR  (CRT-D)  07/06/2014  . BILATERAL OOPHORECTOMY  01/2011   ovarian cyst benign  . BIV ICD INSERTION CRT-D N/A 03/31/2019   Procedure: BIV ICD INSERTION CRT-D;  Surgeon: Constance Haw, MD;  Location: [place] CV LAB;  Service:  Cardiovascular;  Laterality: N/A;  . COLONOSCOPY WITH PROPOFOL Left 02/22/2015   Procedure: COLONOSCOPY WITH PROPOFOL;  Surgeon: Hulen Luster, MD;  Location: [facility] ENDOSCOPY;  Service: Endoscopy;  Laterality: Left;  . CORONARY ANGIOPLASTY WITH STENT PLACEMENT Left 07/2012   new onset systolic CHF; elevated troponins.  Cardiac catheterization with stenting to LAD; EF 15%.  2D-echo: EF 20-25%.  . ESOPHAGOGASTRODUODENOSCOPY N/A 02/22/2015   Procedure: ESOPHAGOGASTRODUODENOSCOPY (EGD);  Surgeon: Hulen Luster, MD;  Location: [facility] ENDOSCOPY;  Service: Endoscopy;  Laterality: N/A;  . INCISION AND DRAINAGE ABSCESS Right 2007   groin; with ICU stay due to sepsis.  Marland Kitchen LAPAROSCOPIC CHOLECYSTECTOMY  2011  . LEFT HEART CATH AND CORONARY ANGIOGRAPHY N/A 10/05/2018   Procedure: LEFT HEART CATH AND CORONARY ANGIOGRAPHY;  Surgeon: Minna Merritts, MD;  Location: [place] CV LAB;  Service: Cardiovascular;  Laterality: N/A;  . LEFT HEART CATHETERIZATION WITH CORONARY ANGIOGRAM N/A 07/21/2012   Procedure: LEFT HEART CATHETERIZATION WITH CORONARY ANGIOGRAM;  Surgeon: Jolaine Artist, MD;  Location: [facility] CATH LAB;  Service: Cardiovascular;  Laterality: N/A;  . [place]   L4-5  . PACEMAKER REMOVAL  11/2018   due to infection around pacemaker  . PERCUTANEOUS CORONARY STENT INTERVENTION (PCI-S) N/A 07/23/2012   Procedure: PERCUTANEOUS CORONARY STENT INTERVENTION (PCI-S);  Surgeon: Sherren Mocha, MD;  Location: [facility] CATH LAB;  Service: Cardiovascular;  Laterality: N/A;  . PERIPHERAL VASCULAR BALLOON ANGIOPLASTY Left 10/06/2018   Procedure: PERIPHERAL VASCULAR BALLOON ANGIOPLASTY;  Surgeon: Katha Cabal, MD;  Location: [place] CV LAB;  Service: Cardiovascular;  Laterality: Left;  . PERIPHERAL VASCULAR CATHETERIZATION N/A 02/10/2015   Procedure: Picc Line Insertion;  Surgeon: Katha Cabal, MD;  Location: [place] CV LAB;  Service: Cardiovascular;  Laterality: N/A;  . RIGHT HEART CATH  N/A 03/29/2019   Procedure: RIGHT HEART CATH;  Surgeon: Jolaine Artist, MD;  Location: [place] CV LAB;  Service: Cardiovascular;  Laterality: N/A;  . TEE WITHOUT CARDIOVERSION N/A 10/02/2018   Procedure: TRANSESOPHAGEAL ECHOCARDIOGRAM (TEE);  Surgeon: Wellington Hampshire, MD;  Location: [facility] ORS;  Service: Cardiovascular;  Laterality: N/A;  . [place]  . VAGINAL HYSTERECTOMY  01/2011   Fibroids/DUB.  Ovaries removed. [place].    Current Meds  Medication Sig  . amiodarone (PACERONE) 200 MG tablet Take 200 mg by mouth daily.  . Calcium Carbonate-Vitamin D3 (CALCIUM 600-D) 600-400 MG-UNIT TABS Take 1 tablet by mouth 2 (two) times a day.  . clopidogrel (PLAVIX) 75 MG tablet Take 1 tablet (75 mg total) by mouth daily.  Marland Kitchen docusate sodium (COLACE) 100 MG capsule Take 1 capsule (100 mg total) by mouth 2 (two) times daily.  . ferrous sulfate 325 (65 FE) MG tablet Take 325 mg by mouth 2 (two) times daily with a meal.  . gabapentin (NEURONTIN) 600 MG tablet Take 1 tablet (600 mg total) by mouth 2 (two) times daily. Takes 1 in the morning and two at bedtime  .  glucose blood (ONE TOUCH ULTRA TEST) test strip USE AS DIRECTED TO CHECK BLOOD SUGAR 3 TIMES DAILY  . hydrALAZINE (APRESOLINE) 25 MG tablet Take 1/2 tablet by mouth 3 times a day.  . insulin glargine (LANTUS) 100 UNIT/ML injection Inject 0.16 mLs (16 Units total) into the skin at bedtime.  . isosorbide mononitrate (IMDUR) 30 MG 24 hr tablet Take 1 tablet (30 mg total) by mouth daily.  . Multiple Vitamin (MULTIVITAMIN) tablet Take 1 tablet by mouth daily.  . Omega-3 Fatty Acids (FISH OIL) 1000 MG CAPS Take 1 capsule by mouth 2 (two) times a day.   . potassium chloride (K-DUR) 20 MEQ tablet Take 1 tablet (20 mEq total) by mouth 2 (two) times daily.  . rosuvastatin (CRESTOR) 10 MG tablet Take 1 tablet (10 mg total) by mouth daily.  Marland Kitchen torsemide (DEMADEX) 20 MG tablet Take 2 tablets (40 mg) in the AM and 1 tabler (20mg ) in the PM  .  traMADol (ULTRAM) 50 MG tablet Take 1 tablet (50 mg total) by mouth every 6 (six) hours as needed. (Patient taking differently: Take 50 mg by mouth every 6 (six) hours as needed for moderate pain. )    No Known Allergies    Review of Systems negative except from HPI and PMH  Physical Exam BP 122/60 (BP Location: Right Arm, Patient Position: Sitting, Cuff Size: Normal)   Pulse 65   Ht 5\' 8"  (1.727 m)   Wt 252 lb (114.3 kg)   SpO2 98%   BMI 38.32 kg/m   Well developed and nourished in no acute distress HENT normal Neck supple with JVP-  flat   Clear Regular rate and rhythm, no murmurs or gallops Abd-soft with active BS No Clubbing cyanosis left greater than right edema 2+ versus 1+ edema Skin-warm and dry A & Oriented  Grossly normal sensory and motor function  ECG none   Assessment and  Plan  CRT-D recent reimplantation following extraction  Nonischemic myopathy  Congestive heart failure-chronic-systolic class II  Pocket hematoma  Hypothyroidism  High Risk Medication Surveillance  PVCs  Functional status is much improved now a class IIb symptoms versus 3B symptoms.  Volume status is stable.  Ventricular ectopy is much less burdensome.  Tolerating amiodarone without apparent issues although she is now hypothyroid.  Her PCPs note had suggested the initiation of Synthroid; however, it was never consummated.  We will begin her on levothyroxine 25 mcg.  Plan to recheck when we see her in September. Current medicines are reviewed at length with the patient today .  The patient does not  have concerns regarding medicines.   We spent more than 50% of our >25 min visit in face to face counseling regarding the above

## 2019-05-18 NOTE — Patient Instructions (Addendum)
Medication Instructions:  Your physician has recommended you make the following change in your medication:  1- START Levothyroxine 25 mcg by mouth once a day in the morning prior to breakfast.  If you need a refill on your cardiac medications before your next appointment, please call your pharmacy.   Lab work: Your physician recommends that you return for lab work in: TODAY (BMET, TSH)  If you have labs (blood work) drawn today and your tests are completely normal, you will receive your results only by: Marland Kitchen MyChart Message (if you have MyChart) OR . A paper copy in the mail If you have any lab test that is abnormal or we need to change your treatment, we will call you to review the results.  Testing/Procedures: Your physician has requested that you have an echocardiogram (In December 2020). Echocardiography is a painless test that uses sound waves to create images of your heart. It provides your doctor with information about the size and shape of your heart and how well your heart's chambers and valves are working. This procedure takes approximately one hour. There are no restrictions for this procedure. You may get an IV, if needed, to receive an ultrasound enhancing agent through to better visualize your heart.    Follow-Up: At [facility], you and your health needs are our priority.  As part of our continuing mission to provide you with exceptional heart care, we have created designated Provider Care Teams.  These Care Teams include your primary Cardiologist (physician) and Advanced Practice Providers (APPs -  Physician Assistants and Nurse Practitioners) who all work together to provide you with the care you need, when you need it. You will need a follow up appointment in 1 months (on or after September 24th).  Please call our office 2 months in advance to schedule this appointment.  You may see Virl Axe, MD.     Echocardiogram An echocardiogram is a procedure that uses painless  sound waves (ultrasound) to produce an image of the heart. Images from an echocardiogram can provide important information about:  Signs of coronary artery disease (CAD).  Aneurysm detection. An aneurysm is a weak or damaged part of an artery wall that bulges out from the normal force of blood pumping through the body.  Heart size and shape. Changes in the size or shape of the heart can be associated with certain conditions, including heart failure, aneurysm, and CAD.  Heart muscle function.  Heart valve function.  Signs of a past heart attack.  Fluid buildup around the heart.  Thickening of the heart muscle.  A tumor or infectious growth around the heart valves. Tell a health care provider about:  Any allergies you have.  All medicines you are taking, including vitamins, herbs, eye drops, creams, and over-the-counter medicines.  Any blood disorders you have.  Any surgeries you have had.  Any medical conditions you have.  Whether you are pregnant or may be pregnant. What are the risks? Generally, this is a safe procedure. However, problems may occur, including:  Allergic reaction to dye (contrast) that may be used during the procedure. What happens before the procedure? No specific preparation is needed. You may eat and drink normally. What happens during the procedure?   An IV tube may be inserted into one of your veins.  You may receive contrast through this tube. A contrast is an injection that improves the quality of the pictures from your heart.  A gel will be applied to your chest.  A wand-like tool (transducer) will be moved over your chest. The gel will help to transmit the sound waves from the transducer.  The sound waves will harmlessly bounce off of your heart to allow the heart images to be captured in real-time motion. The images will be recorded on a computer. The procedure may vary among health care providers and hospitals. What happens after the  procedure?  You may return to your normal, everyday life, including diet, activities, and medicines, unless your health care provider tells you not to do that. Summary  An echocardiogram is a procedure that uses painless sound waves (ultrasound) to produce an image of the heart.  Images from an echocardiogram can provide important information about the size and shape of your heart, heart muscle function, heart valve function, and fluid buildup around your heart.  You do not need to do anything to prepare before this procedure. You may eat and drink normally.  After the echocardiogram is completed, you may return to your normal, everyday life, unless your health care provider tells you not to do that. This information is not intended to replace advice given to you by your health care provider. Make sure you discuss any questions you have with your health care provider. Document Released: 09/20/2000 Document Revised: 01/14/2019 Document Reviewed: 10/26/2016 Elsevier Patient Education  2020 [employer].

## 2019-05-19 LAB — BASIC METABOLIC PANEL
BUN/Creatinine Ratio: 23 (ref 12–28)
BUN: 44 mg/dL — ABNORMAL HIGH (ref 8–27)
CO2: 26 mmol/L (ref 20–29)
Calcium: 9.2 mg/dL (ref 8.7–10.3)
Chloride: 99 mmol/L (ref 96–106)
Creatinine, Ser: 1.93 mg/dL — ABNORMAL HIGH (ref 0.57–1.00)
GFR calc Af Amer: 31 mL/min/{1.73_m2} — ABNORMAL LOW (ref >59)
GFR calc non Af Amer: 27 mL/min/{1.73_m2} — ABNORMAL LOW (ref >59)
Glucose: 184 mg/dL — ABNORMAL HIGH (ref 65–99)
Potassium: 5.5 mmol/L — ABNORMAL HIGH (ref 3.5–5.2)
Sodium: 140 mmol/L (ref 134–144)

## 2019-05-19 LAB — TSH: TSH: 21.4 u[IU]/mL — ABNORMAL HIGH (ref 0.450–4.500)

## 2019-05-20 ENCOUNTER — Telehealth: Payer: Self-pay | Admitting: Family Medicine

## 2019-05-20 NOTE — Telephone Encounter (Signed)
Call coming in [place] line from [facility]) states that pt verbal order was taken incorrectly and wanted to submit chg to 1 wk (3)  1 every other (5) and 1 wk (1) you may calll her back or leave the verbal at [phone]

## 2019-05-24 ENCOUNTER — Telehealth: Payer: Self-pay | Admitting: Internal Medicine

## 2019-05-24 DIAGNOSIS — E875 Hyperkalemia: Secondary | ICD-10-CM

## 2019-05-24 NOTE — Telephone Encounter (Signed)
I called and spoke with the patient regarding his BMP/ TSH results.  She is aware her TSH was elevated, but she has started her thyroid replacement.   She did have a K+ of 5.5- not addressed by MD. She confirms she is taking Potassium 20 meq BID. I have advised her to hold her potassium until I can speak with Dr. Caryl Comes.  She voices understanding and is agreeable.

## 2019-05-25 DIAGNOSIS — D509 Iron deficiency anemia, unspecified: Secondary | ICD-10-CM | POA: Diagnosis not present

## 2019-05-25 DIAGNOSIS — Z48812 Encounter for surgical aftercare following surgery on the circulatory system: Secondary | ICD-10-CM | POA: Diagnosis not present

## 2019-05-25 DIAGNOSIS — I447 Left bundle-branch block, unspecified: Secondary | ICD-10-CM | POA: Diagnosis not present

## 2019-05-25 DIAGNOSIS — L97511 Non-pressure chronic ulcer of other part of right foot limited to breakdown of skin: Secondary | ICD-10-CM | POA: Diagnosis not present

## 2019-05-25 DIAGNOSIS — Z955 Presence of coronary angioplasty implant and graft: Secondary | ICD-10-CM | POA: Diagnosis not present

## 2019-05-25 DIAGNOSIS — E1122 Type 2 diabetes mellitus with diabetic chronic kidney disease: Secondary | ICD-10-CM | POA: Diagnosis not present

## 2019-05-25 DIAGNOSIS — E559 Vitamin D deficiency, unspecified: Secondary | ICD-10-CM | POA: Diagnosis not present

## 2019-05-25 DIAGNOSIS — Z950 Presence of cardiac pacemaker: Secondary | ICD-10-CM | POA: Diagnosis not present

## 2019-05-25 DIAGNOSIS — E1142 Type 2 diabetes mellitus with diabetic polyneuropathy: Secondary | ICD-10-CM | POA: Diagnosis not present

## 2019-05-25 DIAGNOSIS — Z8631 Personal history of diabetic foot ulcer: Secondary | ICD-10-CM | POA: Diagnosis not present

## 2019-05-25 DIAGNOSIS — Z95820 Peripheral vascular angioplasty status with implants and grafts: Secondary | ICD-10-CM | POA: Diagnosis not present

## 2019-05-25 DIAGNOSIS — Z87440 Personal history of urinary (tract) infections: Secondary | ICD-10-CM | POA: Diagnosis not present

## 2019-05-25 DIAGNOSIS — I5023 Acute on chronic systolic (congestive) heart failure: Secondary | ICD-10-CM | POA: Diagnosis not present

## 2019-05-25 DIAGNOSIS — F329 Major depressive disorder, single episode, unspecified: Secondary | ICD-10-CM | POA: Diagnosis not present

## 2019-05-25 DIAGNOSIS — N189 Chronic kidney disease, unspecified: Secondary | ICD-10-CM | POA: Diagnosis not present

## 2019-05-25 DIAGNOSIS — R238 Other skin changes: Secondary | ICD-10-CM | POA: Diagnosis not present

## 2019-05-25 DIAGNOSIS — I255 Ischemic cardiomyopathy: Secondary | ICD-10-CM | POA: Diagnosis not present

## 2019-05-25 DIAGNOSIS — I2690 Septic pulmonary embolism without acute cor pulmonale: Secondary | ICD-10-CM | POA: Diagnosis not present

## 2019-05-25 DIAGNOSIS — I252 Old myocardial infarction: Secondary | ICD-10-CM | POA: Diagnosis not present

## 2019-05-25 DIAGNOSIS — Z89422 Acquired absence of other left toe(s): Secondary | ICD-10-CM | POA: Diagnosis not present

## 2019-05-25 DIAGNOSIS — L97521 Non-pressure chronic ulcer of other part of left foot limited to breakdown of skin: Secondary | ICD-10-CM | POA: Diagnosis not present

## 2019-05-25 DIAGNOSIS — I130 Hypertensive heart and chronic kidney disease with heart failure and stage 1 through stage 4 chronic kidney disease, or unspecified chronic kidney disease: Secondary | ICD-10-CM | POA: Diagnosis not present

## 2019-05-25 DIAGNOSIS — I2510 Atherosclerotic heart disease of native coronary artery without angina pectoris: Secondary | ICD-10-CM | POA: Diagnosis not present

## 2019-05-25 DIAGNOSIS — G4733 Obstructive sleep apnea (adult) (pediatric): Secondary | ICD-10-CM | POA: Diagnosis not present

## 2019-05-25 DIAGNOSIS — Z5181 Encounter for therapeutic drug level monitoring: Secondary | ICD-10-CM | POA: Diagnosis not present

## 2019-05-25 DIAGNOSIS — Z89421 Acquired absence of other right toe(s): Secondary | ICD-10-CM | POA: Diagnosis not present

## 2019-05-25 NOTE — Telephone Encounter (Signed)
Reviewed the patient's elevated potassium with Dr. Caryl Comes. Per MD- hold potassium for now and repeat on Thursday (8/20).  I have called and notified the patient of MD recommendations.  She voices understanding is and is agreeable with a repeat BMP at the [place] on 8/20.  She is aware I will follow back up with her after the lab draw to instructions regarding his potassium.

## 2019-05-27 ENCOUNTER — Other Ambulatory Visit: Payer: Self-pay

## 2019-05-27 ENCOUNTER — Encounter: Payer: Self-pay | Admitting: Nurse Practitioner

## 2019-05-27 ENCOUNTER — Other Ambulatory Visit: Payer: PPO | Admitting: Nurse Practitioner

## 2019-05-27 DIAGNOSIS — Z515 Encounter for palliative care: Secondary | ICD-10-CM

## 2019-05-27 NOTE — Progress Notes (Signed)
Therapist, nutritional Palliative Care Consult Note Telephone: [phone]  Fax: [phone]  PATIENT NAME: [name] DOB: March 08, 1956 MRN: [id number]  PRIMARY CARE PROVIDER:   Myles Lipps, MD  REFERRING PROVIDER:  Myles Lipps, MD [address] [place],  [place] [address]  RESPONSIBLE PARTY:   Self  Due to the COVID-19 crisis, this visit was done via telemedicine from my office and it was initiated and consent by this patient and or family.  RECOMMENDATIONS and PLAN: 1. ACP: DNR; Treat what is treatable  2.Dyspneic R06.00 secondary to remain stable at present time. Continue daily weights  3.Generalized weaknessR53.1 improving secondary to CHF/CMcontinue with therapy as able. Encourage energy conservation and rest times.  4. Palliative care encounter Z51.5; Palliative medicine team will continue to support patient, patient's family, and medical team. Visit consisted of counseling and education dealing with the complex and emotionally intense issues of symptom management and palliative care in the setting of serious and potentially life-threatening illness  I spent 30 minutes providing this consultation,  from 2:00pm to 2:30pm. More than 50% of the time in this consultation was spent coordinating communication.   HISTORY OF PRESENT ILLNESS:  [name] is a 63 y.o. year old female with multiple medical problems including Late onset CVA, Systolic congestive heart failure, coronary artery disease, automatic implantable cardioverter-defibrillator placed, myocardial infarction, left bundle branch block, his shaved endocarditis, obstructive sleep apnea, diabetes, hyperlipidemia, lymphedema, hypertension, obesity, neuropathy, chronic kidney disease, iron deficiency anemia, depression, history of osteomyelitis ankle/foot 2013, cataracts, neuropathy, vaginal hysterectomy with oophorectomy, tubal ligation, TEE without cardioversion, right, left toe  amputation, lumbar disc surgery, cholecystectomy. Cardiology visit 7 / 1 / 2020 in [facility] Clinic. Her documentation Family Social stressors with sudden recent Unexpected death of her husband with funeral being held 50 / 46 / 51. She had an implant 6 / 24 / 2020 and hematoma at site which continue large area that extending eccymosis of the xiphoid and fairly towards the axillary. 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 She was admitted 6 / 39 / 2020 with A/C biventricular HF. Ejection fraction improve previously with CRT but now back down again after CRT removal secondary to sepsis. Fluid overload on exam with rising creatinine with low output. She was start on metolazone and IV Lasix. She was placed on milrinone which was weaned off. On 6 / 24 / 2020 she underwent a CRT-D and unfortunately her husband passed away of a CVA same day. She was discharged home with home health and Community paramedic to continue to be followed by palliative care. She was seen by the community paramedic 7 / 9 / 2020. I called [name] for scheduled telemedicine palliative care follow-up visit. We talked about purpose of palliative care follow-up visit. [name] n agreement. We talked about how she was feeling. [name] endorses she is doing much better. We talked about her functional abilities. She sure that she is able to do household chores and addition to make food. She does continue to live with her daughter and grandchildren. [place] to the last several weeks for some quiet time. We talked about the importance of keeping her phone with her in case of an emergency or if she was to become  symptomatic. She talked about her appointment with Dr. Graciela Husbands cardiologist. We talked about Community paramedic  visits. We talked about past medical history that is any of chronic disease and progression. She is doing significantly better since pacemaker has been switched out. She talked about symptoms of pain which are minimal. She does experience shortness of breath on occasion but not like she had. She is able to ambulate distances. She has had an improved appetite and eating well. She is very pleased with her progress. We talked about the loss of her husband and grieving. We talked about coping strategies. Discuss the role of palliative care and plan of care. Medical goals remain unchanged treat what is treatable but wishes are to remain a DNR. Praised [name] for her  positive efforts and self-care. Weights have been stable. Discuss follow-up palliative care visit in 2 months if needed or sooner should she declined. [name] will notify if need a visit sooner. Palliative care follow-up visit scheduled. Therapeutic listening and emotional support provided. Questions answered satisfaction. Contact information provided. Palliative Care was asked to help to continue to address goals of care.   CODE STATUS: DNR  PPS: 60% HOSPICE ELIGIBILITY/DIAGNOSIS: TBD  PAST MEDICAL HISTORY:  Past Medical History:  Diagnosis Date   Acute myocardial infarction, subendocardial infarction, initial episode of care (HCC) 07/21/2012   Acute osteomyelitis involving ankle and foot (HCC) 02/06/2015   Acute systolic heart failure (HCC) 07/21/2012   New onset 07/19/12; admission to [facility] ED. Elevated Troponins.  S/p 2D-echo with EF 20-25%.  S/p cardiac catheterization with stenting LAD.  Repeat 2D-echo 10/2011 with improved EF of 35%.    Anemia    Automatic implantable cardioverter-defibrillator in situ    a. MDT CRT-D 06/2014, SN: [id number]  .removed in feb 2020   CAD (coronary artery disease)    a. cardiac cath 101/04/2012: PCI/DES to chronically occluded mLAD, consideration PCI to diag branch in 4 weeks.     Cataract    Chicken pox    Chronic kidney disease    Chronic systolic CHF (congestive heart failure) (HCC)    a. mixed ICM & NICM; b. EF 20-25% by echo 07/2012, mid-dist 2/3 of LV sev HK/AK, mild MR. echo 10/2012: EF 30-35%, sev HK ant-septal & inf walls, GR1DD, mild MR, PASP 33. c. echo 02/2013: EF 30%, GR1DD, mild MR. echo 04/2014: EF 30%, Septal-lat dyssynchrony, global HK, inf AK, GR1DD, mild MR. d. echo 10/2014: EF50-55%, WM nl, GR1DD, septal mild paradox. e. echo 02/2015: EF 50-55%, wm    Depression    Heart attack (HCC)    Heel ulcer (HCC) 04/27/2015   History of blood transfusion ~ 2011   "plasma; had neuropathy; couldn't walk"   Hypertension    LBBB (left bundle branch block)    Neuromuscular disorder (HCC)    Neuropathy 2011   Obesity, unspecified    OSA on CPAP    Moderate with AHI 23/hr and now on CPAP at 16cm H2O.  Her DME is [facility]   Pure hypercholesterolemia    Sepsis (HCC) 09/2018   Type II diabetes mellitus (HCC)    Unspecified vitamin D deficiency     SOCIAL HX:  Social History   Tobacco Use   Smoking status: Never Smoker   Smokeless tobacco: Never Used  Substance Use Topics   Alcohol use: No    Alcohol/week: 0.0 standard drinks    ALLERGIES: No Known Allergies   PERTINENT MEDICATIONS:  Outpatient Encounter Medications as of 05/27/2019  Medication Sig   amiodarone (PACERONE) 200 MG tablet Take 200 mg by mouth daily.   Calcium Carbonate-Vitamin D3 (CALCIUM 600-D) 600-400 MG-UNIT TABS Take 1 tablet by mouth 2 (two) times a day.   clopidogrel (PLAVIX) 75 MG tablet Take 1 tablet (75 mg total) by mouth daily.   docusate sodium (COLACE) 100 MG capsule Take 1 capsule (100 mg total) by mouth 2 (two) times daily.   ferrous sulfate 325 (65 FE) MG tablet Take 325 mg by mouth 2 (two) times daily with a meal.   gabapentin (NEURONTIN) 600 MG tablet Take 1 tablet (600 mg total) by mouth 2 (two) times daily. Takes 1 in the morning and two at bedtime    glucose blood (ONE TOUCH ULTRA TEST) test strip USE AS DIRECTED TO CHECK BLOOD SUGAR 3 TIMES DAILY   hydrALAZINE (APRESOLINE) 25 MG tablet Take 1/2 tablet by mouth 3 times a day.   insulin glargine (LANTUS) 100 UNIT/ML injection Inject 0.16 mLs (16 Units total) into the skin at bedtime.   isosorbide mononitrate (IMDUR) 30 MG 24 hr tablet Take 1 tablet (30 mg total) by mouth daily.   levothyroxine (SYNTHROID) 25 MCG tablet Take 1 tablet (25 mcg total) by mouth daily before breakfast.   Multiple Vitamin (MULTIVITAMIN) tablet Take 1 tablet by mouth daily.   Omega-3 Fatty Acids (FISH OIL) 1000 MG CAPS Take 1 capsule by mouth 2 (two) times a day.    potassium chloride (K-DUR) 20 MEQ tablet Take 1 tablet (20 mEq total) by mouth 2 (two) times daily.   rosuvastatin (CRESTOR) 10 MG tablet Take 1 tablet (10 mg total) by mouth daily.   torsemide (DEMADEX) 20 MG tablet Take 2 tablets (40 mg) in the AM and 1 tabler (20mg ) in the PM   traMADol (ULTRAM) 50 MG tablet Take 1 tablet (50 mg total) by mouth every 6 (six) hours as needed. (Patient taking differently: Take 50 mg by mouth every 6 (six) hours as needed for moderate pain. )   No facility-administered encounter medications on file as of 05/27/2019.     PHYSICAL EXAM:   Deferred  Wilhelmine Krogstad Z Eyad Rochford, NP

## 2019-05-28 DIAGNOSIS — A492 Hemophilus influenzae infection, unspecified site: Secondary | ICD-10-CM | POA: Diagnosis not present

## 2019-05-28 DIAGNOSIS — G4733 Obstructive sleep apnea (adult) (pediatric): Secondary | ICD-10-CM | POA: Diagnosis not present

## 2019-05-28 DIAGNOSIS — J189 Pneumonia, unspecified organism: Secondary | ICD-10-CM | POA: Diagnosis not present

## 2019-05-28 DIAGNOSIS — I5022 Chronic systolic (congestive) heart failure: Secondary | ICD-10-CM | POA: Diagnosis not present

## 2019-06-01 DIAGNOSIS — M86171 Other acute osteomyelitis, right ankle and foot: Secondary | ICD-10-CM | POA: Diagnosis not present

## 2019-06-01 DIAGNOSIS — J189 Pneumonia, unspecified organism: Secondary | ICD-10-CM | POA: Diagnosis not present

## 2019-06-01 DIAGNOSIS — G4733 Obstructive sleep apnea (adult) (pediatric): Secondary | ICD-10-CM | POA: Diagnosis not present

## 2019-06-01 DIAGNOSIS — I5022 Chronic systolic (congestive) heart failure: Secondary | ICD-10-CM | POA: Diagnosis not present

## 2019-06-02 ENCOUNTER — Encounter: Payer: Self-pay | Admitting: Internal Medicine

## 2019-06-02 DIAGNOSIS — J449 Chronic obstructive pulmonary disease, unspecified: Secondary | ICD-10-CM | POA: Diagnosis not present

## 2019-06-02 DIAGNOSIS — R0902 Hypoxemia: Secondary | ICD-10-CM | POA: Diagnosis not present

## 2019-06-03 DIAGNOSIS — M2041 Other hammer toe(s) (acquired), right foot: Secondary | ICD-10-CM | POA: Diagnosis not present

## 2019-06-03 DIAGNOSIS — L97511 Non-pressure chronic ulcer of other part of right foot limited to breakdown of skin: Secondary | ICD-10-CM | POA: Diagnosis not present

## 2019-06-03 DIAGNOSIS — L97521 Non-pressure chronic ulcer of other part of left foot limited to breakdown of skin: Secondary | ICD-10-CM | POA: Diagnosis not present

## 2019-06-03 DIAGNOSIS — E1142 Type 2 diabetes mellitus with diabetic polyneuropathy: Secondary | ICD-10-CM | POA: Diagnosis not present

## 2019-06-05 DIAGNOSIS — M86171 Other acute osteomyelitis, right ankle and foot: Secondary | ICD-10-CM | POA: Diagnosis not present

## 2019-06-05 DIAGNOSIS — G4733 Obstructive sleep apnea (adult) (pediatric): Secondary | ICD-10-CM | POA: Diagnosis not present

## 2019-06-05 DIAGNOSIS — J189 Pneumonia, unspecified organism: Secondary | ICD-10-CM | POA: Diagnosis not present

## 2019-06-05 DIAGNOSIS — I5022 Chronic systolic (congestive) heart failure: Secondary | ICD-10-CM | POA: Diagnosis not present

## 2019-06-07 ENCOUNTER — Encounter (HOSPITAL_COMMUNITY): Payer: PPO

## 2019-06-07 NOTE — Telephone Encounter (Signed)
I do not see that the patient had her labs repeated. I attempted to call the patient to follow up on this. No answer- I left a message to please call back.

## 2019-06-07 NOTE — Telephone Encounter (Signed)
I spoke with the patient. I advised her I had not seen her BMP results come back yet. She states she is in the process of moving and forgot to get it checked.  She is still holding her potassium. I asked her if she could get this done this week so we know how to dose her potassium.  She is agreeable with coming to [place].

## 2019-06-08 ENCOUNTER — Other Ambulatory Visit: Payer: Self-pay

## 2019-06-08 ENCOUNTER — Encounter (HOSPITAL_COMMUNITY): Payer: Self-pay

## 2019-06-08 ENCOUNTER — Ambulatory Visit (HOSPITAL_COMMUNITY)
Admission: RE | Admit: 2019-06-08 | Discharge: 2019-06-08 | Disposition: A | Discharge status: Home / Self Care | Payer: PPO | Source: Ambulatory Visit | Attending: Cardiology | Admitting: Cardiology

## 2019-06-08 ENCOUNTER — Other Ambulatory Visit
Admission: RE | Admit: 2019-06-08 | Discharge: 2019-06-08 | Disposition: A | Discharge status: Home / Self Care | Payer: PPO | Source: Ambulatory Visit | Attending: Internal Medicine | Admitting: Internal Medicine

## 2019-06-08 VITALS — BP 118/65 | HR 76 | Wt 240.4 lb

## 2019-06-08 DIAGNOSIS — I493 Ventricular premature depolarization: Secondary | ICD-10-CM | POA: Insufficient documentation

## 2019-06-08 DIAGNOSIS — Z89421 Acquired absence of other right toe(s): Secondary | ICD-10-CM | POA: Insufficient documentation

## 2019-06-08 DIAGNOSIS — Z89422 Acquired absence of other left toe(s): Secondary | ICD-10-CM | POA: Diagnosis not present

## 2019-06-08 DIAGNOSIS — E1122 Type 2 diabetes mellitus with diabetic chronic kidney disease: Secondary | ICD-10-CM | POA: Diagnosis not present

## 2019-06-08 DIAGNOSIS — I25119 Atherosclerotic heart disease of native coronary artery with unspecified angina pectoris: Secondary | ICD-10-CM | POA: Diagnosis not present

## 2019-06-08 DIAGNOSIS — G4733 Obstructive sleep apnea (adult) (pediatric): Secondary | ICD-10-CM | POA: Insufficient documentation

## 2019-06-08 DIAGNOSIS — Z8042 Family history of malignant neoplasm of prostate: Secondary | ICD-10-CM | POA: Insufficient documentation

## 2019-06-08 DIAGNOSIS — E1142 Type 2 diabetes mellitus with diabetic polyneuropathy: Secondary | ICD-10-CM | POA: Insufficient documentation

## 2019-06-08 DIAGNOSIS — E039 Hypothyroidism, unspecified: Secondary | ICD-10-CM | POA: Insufficient documentation

## 2019-06-08 DIAGNOSIS — I472 Ventricular tachycardia: Secondary | ICD-10-CM | POA: Insufficient documentation

## 2019-06-08 DIAGNOSIS — Z8052 Family history of malignant neoplasm of bladder: Secondary | ICD-10-CM | POA: Insufficient documentation

## 2019-06-08 DIAGNOSIS — I5042 Chronic combined systolic (congestive) and diastolic (congestive) heart failure: Secondary | ICD-10-CM | POA: Insufficient documentation

## 2019-06-08 DIAGNOSIS — I428 Other cardiomyopathies: Secondary | ICD-10-CM | POA: Insufficient documentation

## 2019-06-08 DIAGNOSIS — Z95810 Presence of automatic (implantable) cardiac defibrillator: Secondary | ICD-10-CM | POA: Insufficient documentation

## 2019-06-08 DIAGNOSIS — I471 Supraventricular tachycardia: Secondary | ICD-10-CM | POA: Diagnosis not present

## 2019-06-08 DIAGNOSIS — I130 Hypertensive heart and chronic kidney disease with heart failure and stage 1 through stage 4 chronic kidney disease, or unspecified chronic kidney disease: Secondary | ICD-10-CM | POA: Diagnosis not present

## 2019-06-08 DIAGNOSIS — Z8249 Family history of ischemic heart disease and other diseases of the circulatory system: Secondary | ICD-10-CM | POA: Diagnosis not present

## 2019-06-08 DIAGNOSIS — I5022 Chronic systolic (congestive) heart failure: Secondary | ICD-10-CM | POA: Diagnosis not present

## 2019-06-08 DIAGNOSIS — Z955 Presence of coronary angioplasty implant and graft: Secondary | ICD-10-CM | POA: Diagnosis not present

## 2019-06-08 DIAGNOSIS — I447 Left bundle-branch block, unspecified: Secondary | ICD-10-CM | POA: Diagnosis not present

## 2019-06-08 DIAGNOSIS — N184 Chronic kidney disease, stage 4 (severe): Secondary | ICD-10-CM | POA: Diagnosis not present

## 2019-06-08 DIAGNOSIS — I252 Old myocardial infarction: Secondary | ICD-10-CM | POA: Insufficient documentation

## 2019-06-08 DIAGNOSIS — E1136 Type 2 diabetes mellitus with diabetic cataract: Secondary | ICD-10-CM | POA: Diagnosis not present

## 2019-06-08 DIAGNOSIS — E875 Hyperkalemia: Secondary | ICD-10-CM | POA: Insufficient documentation

## 2019-06-08 DIAGNOSIS — E669 Obesity, unspecified: Secondary | ICD-10-CM | POA: Diagnosis not present

## 2019-06-08 DIAGNOSIS — I2510 Atherosclerotic heart disease of native coronary artery without angina pectoris: Secondary | ICD-10-CM | POA: Diagnosis not present

## 2019-06-08 DIAGNOSIS — Z833 Family history of diabetes mellitus: Secondary | ICD-10-CM | POA: Insufficient documentation

## 2019-06-08 DIAGNOSIS — Z7902 Long term (current) use of antithrombotics/antiplatelets: Secondary | ICD-10-CM | POA: Diagnosis not present

## 2019-06-08 DIAGNOSIS — Z808 Family history of malignant neoplasm of other organs or systems: Secondary | ICD-10-CM | POA: Insufficient documentation

## 2019-06-08 LAB — BASIC METABOLIC PANEL
Anion gap: 10 (ref 5–15)
BUN: 50 mg/dL — ABNORMAL HIGH (ref 8–23)
CO2: 29 mmol/L (ref 22–32)
Calcium: 8.9 mg/dL (ref 8.9–10.3)
Chloride: 101 mmol/L (ref 98–111)
Creatinine, Ser: 2.02 mg/dL — ABNORMAL HIGH (ref 0.44–1.00)
GFR calc Af Amer: 29 mL/min — ABNORMAL LOW (ref >60)
GFR calc non Af Amer: 25 mL/min — ABNORMAL LOW (ref >60)
Glucose, Bld: 88 mg/dL (ref 70–99)
Potassium: 4.3 mmol/L (ref 3.5–5.1)
Sodium: 140 mmol/L (ref 135–145)

## 2019-06-08 NOTE — Progress Notes (Signed)
Advanced Heart Failure Clinic Progress Note     06/08/2019 Harlie Himmelsbach [place]   1956/05/24  [id number]  Primary Physician Myles Lipps, MD Primary Cardiologist: Julien Nordmann, MD  Electrophysiologist: Sherryl Manges, MD   Advanced HF: Dr. Gala Romney  Sleep Clinic: Dr. Mayford Knife   Reason for Visit/CC: f/u for chronic systolic HF  HPI:  [name] is a 63 y.o. female who is being seen today for f/u for chronic systolic HF and recent medication adjustment.   Has h/o CAD w/ prior MI>>LAD DES,LBBB, mixedNICM/ICM combinedsystolic and diastolic CHF,status post CRT-D 2015 with improvement in EF to 40 to 45%>>10-15% after ICD removed 2nd bacteremia,, DM 2, HTN, obesity,PAD w/ PTA L anterior tibial artery 10/06/2018,OSA prescribed CPAP, NSTEMI 12/2019s/p cath w/ med rx for CAD>>2/2 MSSA bacteremia s/p toe amputation, TV and lead vegetations, s/p Angiovac &ICD removal at [facility].   Admitted on 03/23/19 with A/C biventricular HF.EF improved previously with CRT but now back down again after CRT removal due to sepsis.She had marked volume overload on exam and rising creatinine. Possibly low output. Started on IV lasix + metolazone.Placed on milrinone which was later weaned off. Unfortunately while hospitalized her husband had CVA and passed away on 04/25/23. On 2023/04/25 she underwent CRT-D. She was discharged that admit, still  with volume overload and worsening renal function, but she was anxious to go home and plan her husbands funeral. Discharge weight 287 pounds. Discharge with [facility]. Had f/u with Tonye Becket NP on 05/10/19 and was doing well from a symptom standpoint. Volume status was stable. Hydralazine + nitro added to regimen. No ARB, dig or spiro due to CKD. Also on amiodarone for PAT/ NSVT. Recently seen by Dr. Graciela Husbands and TSH was high and pt started on levothyroxine. She has f/u again w/ him in 3 weeks. He placed order to repeat echo in Dec.   She reports that she is doing well. No complaints.  Denies exertional dyspnea. Checking weight at home and stable. Continues to come down. Avoiding salt. Tolerating new meds well. No side effects. No HA w/ Imdur. BP 118/65 today.    Cardiac Studies  [facility] 03/2019  Hemo Data   Most Recent Value  Fick Cardiac Output 5.37 L/min  Fick Cardiac Output Index 2.24 (L/min)/BSA  RA A Wave 16 mmHg  RA V Wave 18 mmHg  RA Mean 16 mmHg  RV Systolic Pressure 57 mmHg  RV Diastolic Pressure 8 mmHg  RV EDP 20 mmHg  PA Systolic Pressure 57 mmHg  PA Diastolic Pressure 28 mmHg  PA Mean 40 mmHg  PW A Wave 27 mmHg  PW V Wave 36 mmHg  PW Mean 27 mmHg  QP/QS 1  TPVR Index 17.85 HRUI    2D Echo 03/2019 1. The left ventricle has severely reduced systolic function, with an ejection fraction of 10-15%. The cavity was mildly dilated. There is mildly increased left ventricular wall thickness. Left ventricular diastolic Doppler parameters are indeterminate.  2. The right ventricle has severely reduced systolic function. The cavity was moderately enlarged. There is no increase in right ventricular wall thickness. Right ventricular systolic pressure is moderately elevated with an estimated pressure of 45.2  mmHg.  3. Left atrial size was mildly dilated.  4. Right atrial size was moderately dilated.  5. The mitral valve is degenerative. Mild thickening of the mitral valve leaflet. There is mild mitral annular calcification present. Mitral valve regurgitation is moderate by color flow Doppler.  6. Tricuspid valve regurgitation is moderate-severe.  7. The aortic valve is tricuspid. Mild thickening of the aortic valve. Aortic valve regurgitation is trivial by color flow Doppler.  8. The aortic root and ascending aorta are normal in size and structure.  9. The inferior vena cava was dilated in size with <50% respiratory variability.    No outpatient medications have been marked as taking for the 06/08/19 encounter [facility] Encounter) with [facility] PA/NP.   No Known  Allergies Past Medical History:  Diagnosis Date  . Acute myocardial infarction, subendocardial infarction, initial episode of care (HCC) 07/21/2012  . Acute osteomyelitis involving ankle and foot (HCC) 02/06/2015  . Acute systolic heart failure (HCC) 07/21/2012   New onset 07/19/12; admission to [facility] ED. Elevated Troponins.  S/p 2D-echo with EF 20-25%.  S/p cardiac catheterization with stenting LAD.  Repeat 2D-echo 10/2011 with improved EF of 35%.   . Anemia   . Automatic implantable cardioverter-defibrillator in situ    a. MDT CRT-D 06/2014, SN: [id number] H  .removed in feb 2020  . CAD (coronary artery disease)    a. cardiac cath 101/04/2012: PCI/DES to chronically occluded mLAD, consideration PCI to diag branch in 4 weeks.   . Cataract   . Chicken pox   . Chronic kidney disease   . Chronic systolic CHF (congestive heart failure) (HCC)    a. mixed ICM & NICM; b. EF 20-25% by echo 07/2012, mid-dist 2/3 of LV sev HK/AK, mild MR. echo 10/2012: EF 30-35%, sev HK ant-septal & inf walls, GR1DD, mild MR, PASP 33. c. echo 02/2013: EF 30%, GR1DD, mild MR. echo 04/2014: EF 30%, Septal-lat dyssynchrony, global HK, inf AK, GR1DD, mild MR. d. echo 10/2014: EF50-55%, WM nl, GR1DD, septal mild paradox. e. echo 02/2015: EF 50-55%, wm   . Depression   . Heart attack (HCC)   . Heel ulcer (HCC) 04/27/2015  . History of blood transfusion ~ 2011   "plasma; had neuropathy; couldn't walk"  . Hypertension   . LBBB (left bundle branch block)   . Neuromuscular disorder (HCC)   . Neuropathy 2011  . Obesity, unspecified   . OSA on CPAP    Moderate with AHI 23/hr and now on CPAP at 16cm H2O.  Her DME is [facility]  . Pure hypercholesterolemia   . Sepsis (HCC) 09/2018  . Type II diabetes mellitus (HCC)   . Unspecified vitamin D deficiency    Family History  Problem Relation Age of Onset  . Diabetes Mother   . Hypertension Mother   . Arthritis Mother        knees, lumbar DDD, cervical DDD  . Cancer Father         prostate,skin,lymphoma.  . Cancer Brother 61       bladder cancer; non-smoker  . Diabetes Maternal Grandmother   . Heart disease Maternal Grandmother   . COPD Maternal Grandmother   . Diabetes Paternal Grandmother   . Obesity Brother   . Diabetes Son   . Hypertension Son   . Cancer Maternal Grandfather   . Diabetes Paternal Grandfather    Past Surgical History:  Procedure Laterality Date  . AMPUTATION TOE Right 06/18/2015   Procedure: AMPUTATION TOE;  Surgeon: Gwyneth Revels, DPM;  Location: [facility] ORS;  Service: Podiatry;  Laterality: Right;  . AMPUTATION TOE Left 10/08/2018   Procedure: AMPUTATION TOE LEFT 2ND;  Surgeon: Recardo Evangelist, DPM;  Location: [facility] ORS;  Service: Podiatry;  Laterality: Left;  . BACK SURGERY    . BI-VENTRICULAR IMPLANTABLE CARDIOVERTER DEFIBRILLATOR N/A 07/06/2014   Procedure:  BI-VENTRICULAR IMPLANTABLE CARDIOVERTER DEFIBRILLATOR  (CRT-D);  Surgeon: Duke Salvia, MD;  Location: [facility] CATH LAB;  Service: Cardiovascular;  Laterality: N/A;  . BI-VENTRICULAR IMPLANTABLE CARDIOVERTER DEFIBRILLATOR  (CRT-D)  07/06/2014  . BILATERAL OOPHORECTOMY  01/2011   ovarian cyst benign  . BIV ICD INSERTION CRT-D N/A 03/31/2019   Procedure: BIV ICD INSERTION CRT-D;  Surgeon: Regan Lemming, MD;  Location: [facility] INVASIVE CV LAB;  Service: Cardiovascular;  Laterality: N/A;  . COLONOSCOPY WITH PROPOFOL Left 02/22/2015   Procedure: COLONOSCOPY WITH PROPOFOL;  Surgeon: Wallace Cullens, MD;  Location: [facility] ENDOSCOPY;  Service: Endoscopy;  Laterality: Left;  . CORONARY ANGIOPLASTY WITH STENT PLACEMENT Left 07/2012   new onset systolic CHF; elevated troponins.  Cardiac catheterization with stenting to LAD; EF 15%.  2D-echo: EF 20-25%.  . ESOPHAGOGASTRODUODENOSCOPY N/A 02/22/2015   Procedure: ESOPHAGOGASTRODUODENOSCOPY (EGD);  Surgeon: Wallace Cullens, MD;  Location[facility] ENDOSCOPY;  Service: Endoscopy;  Laterality: N/A;  . INCISION AND DRAINAGE ABSCESS Right 2007   groin; with ICU stay due to sepsis.   Marland Kitchen LAPAROSCOPIC CHOLECYSTECTOMY  2011  . LEFT HEART CATH AND CORONARY ANGIOGRAPHY N/A 10/05/2018   Procedure: LEFT HEART CATH AND CORONARY ANGIOGRAPHY;  Surgeon: Antonieta Iba, MD;  Location: [facility] INVASIVE CV LAB;  Service: Cardiovascular;  Laterality: N/A;  . LEFT HEART CATHETERIZATION WITH CORONARY ANGIOGRAM N/A 07/21/2012   Procedure: LEFT HEART CATHETERIZATION WITH CORONARY ANGIOGRAM;  Surgeon: Dolores Patty, MD;  Location[facility] CATH LAB;  Service: Cardiovascular;  Laterality: N/A;  . LUMBAR DISC SURGERY  1996   L4-5  . PACEMAKER REMOVAL  11/2018   due to infection around pacemaker  . PERCUTANEOUS CORONARY STENT INTERVENTION (PCI-S) N/A 07/23/2012   Procedure: PERCUTANEOUS CORONARY STENT INTERVENTION (PCI-S);  Surgeon: Tonny Bollman, MD;  Location: [facility] CATH LAB;  Service: Cardiovascular;  Laterality: N/A;  . PERIPHERAL VASCULAR BALLOON ANGIOPLASTY Left 10/06/2018   Procedure: PERIPHERAL VASCULAR BALLOON ANGIOPLASTY;  Surgeon: Renford Dills, MD;  Location: [facility] INVASIVE CV LAB;  Service: Cardiovascular;  Laterality: Left;  . PERIPHERAL VASCULAR CATHETERIZATION N/A 02/10/2015   Procedure: Picc Line Insertion;  Surgeon: Renford Dills, MD;  Location: [facility] INVASIVE CV LAB;  Service: Cardiovascular;  Laterality: N/A;  . RIGHT HEART CATH N/A 03/29/2019   Procedure: RIGHT HEART CATH;  Surgeon: Dolores Patty, MD;  Location: [facility] INVASIVE CV LAB;  Service: Cardiovascular;  Laterality: N/A;  . TEE WITHOUT CARDIOVERSION N/A 10/02/2018   Procedure: TRANSESOPHAGEAL ECHOCARDIOGRAM (TEE);  Surgeon: Iran Ouch, MD;  Location: [facility] ORS;  Service: Cardiovascular;  Laterality: N/A;  . TUBAL LIGATION  1981  . VAGINAL HYSTERECTOMY  01/2011   Fibroids/DUB.  Ovaries removed. [place].   Social History   Socioeconomic History  . Marital status: Married    Spouse name: Molly Maduro  . Number of children: 2  . Years of education: 29  . Highest education level: High school graduate   Occupational History  . Occupation: disabled    Associate Professor: UNEMPLOYED    Comment: 03/2010 for peripheral neuropathy  . 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  Social Needs  . Financial resource strain: Not hard at all  . Food insecurity    Worry: Never true    Inability: Never true  . Transportation needs    Medical: No    Non-medical: No  Tobacco Use  . Smoking status: Never Smoker  . Smokeless tobacco: Never Used  Substance and Sexual Activity  . Alcohol use: No  Alcohol/week: 0.0 standard drinks  . Drug use: No  . Sexual activity: Never    Birth control/protection: Post-menopausal, Surgical  Lifestyle  . Physical activity    Days per week: 0 days    Minutes per session: 0 min  . Stress: Rather much  Relationships  . Social connections    Talks on phone: More than three times a week    Gets together: More than three times a week    Attends religious service: Never    Active member of club or organization: No    Attends meetings of clubs or organizations: Never    Relationship status: Married  . Intimate partner violence    Fear of current or ex partner: No    Emotionally abused: No    Physically abused: No    Forced sexual activity: No  Other Topics Concern  . Not on file  Social History Narrative   Marital status:Married x 45 yrs. Happily married, no abuse.       Children:  2 children (daughter 107, son 59). Two grandsons and 2 step grandchildren.  1 gg.      Lives: with husband, daughter, son-in-law, 2 grandsons, and 1 on the way.      Employment: disability for peripheral neuropathy 2012.  Previously had home daycare.      Tobacco: none      Alcohol: none      Drugs: none      Exercise: none. [employer].      Pets: dog.      Always uses seat belts, smoke detectors in home.      No guns in the home.       Caffeine use: 2 cups coffee per day.       Nutrition: Well balanced diet.     Lipid Panel     Component Value Date/Time   CHOL 82  (L) 02/03/2019 1611   TRIG 101 02/03/2019 1611   HDL 37 (L) 02/03/2019 1611   CHOLHDL 2.2 02/03/2019 1611   CHOLHDL 5.6 (H) 08/06/2016 1431   VLDL 34 (H) 08/06/2016 1431   LDLCALC 25 02/03/2019 1611    Review of Systems: General: negative for chills, fever, night sweats or weight changes.  Cardiovascular: negative for chest pain, dyspnea on exertion, edema, orthopnea, palpitations, paroxysmal nocturnal dyspnea or shortness of breath Dermatological: negative for rash Respiratory: negative for cough or wheezing Urologic: negative for hematuria Abdominal: negative for nausea, vomiting, diarrhea, bright red blood per rectum, melena, or hematemesis Neurologic: negative for visual changes, syncope, or dizziness All other systems reviewed and are otherwise negative except as noted above.   Physical Exam:  Blood pressure 118/65, pulse 76, weight 109 kg (240 lb 6.4 oz), SpO2 96 %.  General appearance: alert, cooperative and no distress Neck: no carotid bruit and no JVD Lungs: clear to auscultation bilaterally Heart: regular rate and rhythm, S1, S2 normal, no murmur, click, rub or gallop Extremities: extremities normal, atraumatic, no cyanosis or edema and varicose veins noted Pulses: 2+ and symmetric Skin: Skin color, texture, turgor normal. No rashes or lesions Neurologic: Grossly normal  EKG not performed -- personally reviewed   ASSESSMENT AND PLAN:   1. Chronic Systolic HF/ Mixed Ischemic/NICM w/ LBBB:  EF 10-15%. Has CRT-D. NYHA Functional Class II. Euvolemic on exam and by [employer]. Continue diuretics. No ARB, dig or spiro due to CKD. Continue hydralazine and Imdur for afterload reduction. BP soft but stable. No changes made today. Continue daily weights at  home and low sodium diet.   2. CKD, Stage IV: followed by nephrology. SCr checked at last OV on 8/11 and had improved, down from 2.15>>1.93. On torsemide for volume control for chronic systolic HF. Continue current regimen, 40  qam/20 qpm.   3. PAT & NSVT, Frequent PVCs: on amiodarone, 200 mg daily and followed by Dr. Graciela Husbands. Denies palpitations.   4. Hypothyroidism: new diagnosed. TSH checked for amiodarone monitoring. Dr. Graciela Husbands recently started levothyroxine. Pt to f/u w/ Dr. Graciela Husbands in 3 weeks.   5. CAD:  prior MI>>LAD DES. Stable w/ angina. Continue Plavix and statin. Good lipid control. LDl 25 mg/dL.   6. OSA: compliant w/ CPAP    Follow-Up f/u in 4-6 weeks.   Gustavo Meditz Delmer Islam, MHS [facility] 06/08/2019 2:40 PM

## 2019-06-08 NOTE — Patient Instructions (Signed)
It was great to see you today! No medication changes are needed at this time.   Your physician recommends that you schedule a follow-up appointment in: 4-6 weeks  Do the following things EVERYDAY: 1) Weigh yourself in the morning before breakfast. Write it down and keep it in a log. 2) Take your medicines as prescribed 3) Eat low salt foods-Limit salt (sodium) to 2000 mg per day.  4) Stay as active as you can everyday 5) Limit all fluids for the day to less than 2 liters  At the [facility], you and your health needs are our priority. As part of our continuing mission to provide you with exceptional heart care, we have created designated Provider Care Teams. These Care Teams include your primary Cardiologist (physician) and Advanced Practice Providers (APPs- Physician Assistants and Nurse Practitioners) who all work together to provide you with the care you need, when you need it.   You may see any of the following providers on your designated Care Team at your next follow up: Marland Kitchen Dr Glori Bickers . Dr [name] . Darrick Grinder, NP   Please be sure to bring in all your medications bottles to every appointment.

## 2019-06-09 NOTE — Telephone Encounter (Signed)
BMP has resulted- K+ 4.3 on no potassium supplementation.  To Dr. Caryl Comes to advise on resuming or not due to diuretics/ and history of hypokalemia.

## 2019-06-10 NOTE — Telephone Encounter (Signed)
Continue diuretics with NO supplementation Will check again in 2 weeks please Nira Conn Thanks SK

## 2019-06-10 NOTE — Telephone Encounter (Signed)
I spoke with the patient.  She is aware of Dr. Olin Pia recommendations to continue diuretics with no potassium supplementation.  She is aware we will recheck a BMP on 9/22 at her follow up appointment.

## 2019-06-15 ENCOUNTER — Telehealth (HOSPITAL_COMMUNITY): Payer: Self-pay

## 2019-06-15 NOTE — Telephone Encounter (Signed)
Attempted to contact, no answer, left message.   Keygan Dumond Rolla EMT-Paramedic [phone] 

## 2019-06-17 DIAGNOSIS — M2041 Other hammer toe(s) (acquired), right foot: Secondary | ICD-10-CM | POA: Diagnosis not present

## 2019-06-17 DIAGNOSIS — L97521 Non-pressure chronic ulcer of other part of left foot limited to breakdown of skin: Secondary | ICD-10-CM | POA: Diagnosis not present

## 2019-06-17 DIAGNOSIS — E1142 Type 2 diabetes mellitus with diabetic polyneuropathy: Secondary | ICD-10-CM | POA: Diagnosis not present

## 2019-06-17 DIAGNOSIS — L97511 Non-pressure chronic ulcer of other part of right foot limited to breakdown of skin: Secondary | ICD-10-CM | POA: Diagnosis not present

## 2019-06-24 ENCOUNTER — Telehealth (HOSPITAL_COMMUNITY): Payer: Self-pay

## 2019-06-24 NOTE — Telephone Encounter (Signed)
Today was a telephone visit with [name].  She states she has been feeling great.  She has not moved yet, still working on it.  She states has all her medications, verified them.  She states her weight is 235 lbs, she is losing.  She watches her diet and fluid.  She states her sugar has been running good.  She denies any swelling.  She denies chest pain, shrotness of breath, headaches or dizziness.  She is aware of up coming appts.  Will continue to visit for heart failure.   [place] [phone]

## 2019-06-28 DIAGNOSIS — G4733 Obstructive sleep apnea (adult) (pediatric): Secondary | ICD-10-CM | POA: Diagnosis not present

## 2019-06-28 DIAGNOSIS — A492 Hemophilus influenzae infection, unspecified site: Secondary | ICD-10-CM | POA: Diagnosis not present

## 2019-06-28 DIAGNOSIS — J189 Pneumonia, unspecified organism: Secondary | ICD-10-CM | POA: Diagnosis not present

## 2019-06-28 DIAGNOSIS — I5022 Chronic systolic (congestive) heart failure: Secondary | ICD-10-CM | POA: Diagnosis not present

## 2019-06-29 ENCOUNTER — Other Ambulatory Visit: Payer: Self-pay

## 2019-06-29 ENCOUNTER — Ambulatory Visit (INDEPENDENT_AMBULATORY_CARE_PROVIDER_SITE_OTHER): Payer: PPO | Admitting: Internal Medicine

## 2019-06-29 VITALS — BP 122/68 | HR 58 | Ht 68.0 in | Wt 234.0 lb

## 2019-06-29 DIAGNOSIS — Z79899 Other long term (current) drug therapy: Secondary | ICD-10-CM

## 2019-06-29 DIAGNOSIS — I255 Ischemic cardiomyopathy: Secondary | ICD-10-CM

## 2019-06-29 DIAGNOSIS — I493 Ventricular premature depolarization: Secondary | ICD-10-CM

## 2019-06-29 DIAGNOSIS — I5022 Chronic systolic (congestive) heart failure: Secondary | ICD-10-CM | POA: Diagnosis not present

## 2019-06-29 DIAGNOSIS — Z95810 Presence of automatic (implantable) cardiac defibrillator: Secondary | ICD-10-CM

## 2019-06-29 NOTE — Progress Notes (Signed)
Patient Care Team: Rutherford Guys, MD as PCP - General (Family Medicine) Minna Merritts, MD as PCP - Cardiology (Cardiology) Deboraha Sprang, MD as PCP - Electrophysiology (Cardiology) Bensimhon, Shaune Pascal, MD as PCP - Advanced Heart Failure (Cardiology) Calvert Cantor, MD as Consulting Physician (Ophthalmology) Lavonia Dana, MD as Consulting Physician (Internal Medicine) Elvina Mattes, Adele Schilder as Attending Physician (Podiatry) Tsosie Billing, MD as Consulting Physician (Infectious Diseases)   HPI  [name] is a 63 y.o. female Seen in follow-up for pocket hematoma related to a CRT-D re implantation 03/31/2019 [facility]) .  She had undergone extraction 3/20 2/2 recurrent MSSA bacteremia at [facility]  Nonischemic cardiomyopathy and ventricular ectopy.  Patient had initial near normalization of LV systolic function following CRT with significant loss of LV function with extraction, although EM reminded me that her husband dies intercurrently as well (? [place])  Now feels great  The patient denies chest pain,  nocturnal dyspnea, orthopnea or peripheral edema.  There have been no palpitations, lightheadedness or syncope.  Modest dyspnea with exertion but MUCH improved      DATE TEST EF   9/15 echo 30 %   1 /17 echo 50 %   12/19 Echo 45-50%   12/19 TEE  No vegetations  12/19 LHC  pLAD80 with ISR; mLAD70 pCx 80;D1 90 mRCA 40  6/20 Echo  10-15%          Date Cr K Hgb TSH LFTs  3./20 3.54>>1.18  11 4.64  18  7/20 2.15 4.9 9.7 (6/20)17.4    18  9/20  2.02 4.3  21.4    No walking and able to do her daily chores.  Much better than prior to her CRT reimplantation.  No orthopnea.  Chronic left greater than right peripheral edema.  Records and Results Reviewed   Past Medical History:  Diagnosis Date  . Acute myocardial infarction, subendocardial infarction, initial episode of care ([place]) 07/21/2012  . Acute osteomyelitis involving ankle and foot  ([place]) 02/06/2015  . Acute systolic heart failure ([place]) 07/21/2012   New onset 07/19/12; admission to [facility] ED. Elevated Troponins.  S/p 2D-echo with EF 20-25%.  S/p cardiac catheterization with stenting LAD.  Repeat 2D-echo 10/2011 with improved EF of 35%.   . Anemia   . Automatic implantable cardioverter-defibrillator in situ    a. MDT CRT-D 06/2014, SN: [id number]  .removed in feb 2020  . CAD (coronary artery disease)    a. cardiac cath 101/04/2012: PCI/DES to chronically occluded mLAD, consideration PCI to diag branch in 4 weeks.   . Cataract   . Chicken pox   . Chronic kidney disease   . Chronic systolic CHF (congestive heart failure) (HCC)    a. mixed ICM & NICM; b. EF 20-25% by echo 07/2012, mid-dist 2/3 of LV sev HK/AK, mild MR. echo 10/2012: EF 30-35%, sev HK ant-septal & inf walls, GR1DD, mild MR, PASP 33. c. echo 02/2013: EF 30%, GR1DD, mild MR. echo 04/2014: EF 30%, Septal-lat dyssynchrony, global HK, inf AK, GR1DD, mild MR. d. echo 10/2014: EF50-55%, WM nl, GR1DD, septal mild paradox. e. echo 02/2015: EF 50-55%, wm   . Depression   . Heart attack ([place])   . Heel ulcer ([place]) 04/27/2015  . History of blood transfusion ~ 2011   "plasma; had neuropathy; couldn't walk"  . Hypertension   . LBBB (left bundle branch block)   . Neuromuscular disorder ([place])   . Neuropathy 2011  . Obesity, unspecified   .  OSA on CPAP    Moderate with AHI 23/hr and now on CPAP at 16cm H2O.  Her DME is [facility]  . Pure hypercholesterolemia   . Sepsis ([place]) 09/2018  . Type II diabetes mellitus ([place])   . Unspecified vitamin D deficiency     Past Surgical History:  Procedure Laterality Date  . AMPUTATION TOE Right 06/18/2015   Procedure: AMPUTATION TOE;  Surgeon: Samara Deist, DPM;  Location: [facility] ORS;  Service: Podiatry;  Laterality: Right;  . AMPUTATION TOE Left 10/08/2018   Procedure: AMPUTATION TOE LEFT 2ND;  Surgeon: Albertine Patricia, DPM;  Location: [facility] ORS;  Service: Podiatry;  Laterality: Left;  . BACK  SURGERY    . BI-VENTRICULAR IMPLANTABLE CARDIOVERTER DEFIBRILLATOR N/A 07/06/2014   Procedure: BI-VENTRICULAR IMPLANTABLE CARDIOVERTER DEFIBRILLATOR  (CRT-D);  Surgeon: Deboraha Sprang, MD;  Location: [facility] CATH LAB;  Service: Cardiovascular;  Laterality: N/A;  . BI-VENTRICULAR IMPLANTABLE CARDIOVERTER DEFIBRILLATOR  (CRT-D)  07/06/2014  . BILATERAL OOPHORECTOMY  01/2011   ovarian cyst benign  . BIV ICD INSERTION CRT-D N/A 03/31/2019   Procedure: BIV ICD INSERTION CRT-D;  Surgeon: Constance Haw, MD;  Location: [facility] CV LAB;  Service: Cardiovascular;  Laterality: N/A;  . COLONOSCOPY WITH PROPOFOL Left 02/22/2015   Procedure: COLONOSCOPY WITH PROPOFOL;  Surgeon: Hulen Luster, MD;  Location[facility] ENDOSCOPY;  Service: Endoscopy;  Laterality: Left;  . CORONARY ANGIOPLASTY WITH STENT PLACEMENT Left 07/2012   new onset systolic CHF; elevated troponins.  Cardiac catheterization with stenting to LAD; EF 15%.  2D-echo: EF 20-25%.  . ESOPHAGOGASTRODUODENOSCOPY N/A 02/22/2015   Procedure: ESOPHAGOGASTRODUODENOSCOPY (EGD);  Surgeon: Hulen Luster, MD;  Location: [facility] ENDOSCOPY;  Service: Endoscopy;  Laterality: N/A;  . INCISION AND DRAINAGE ABSCESS Right 2007   groin; with ICU stay due to sepsis.  Marland Kitchen LAPAROSCOPIC CHOLECYSTECTOMY  2011  . LEFT HEART CATH AND CORONARY ANGIOGRAPHY N/A 10/05/2018   Procedure: LEFT HEART CATH AND CORONARY ANGIOGRAPHY;  Surgeon: Minna Merritts, MD;  Location: [place] CV LAB;  Service: Cardiovascular;  Laterality: N/A;  . LEFT HEART CATHETERIZATION WITH CORONARY ANGIOGRAM N/A 07/21/2012   Procedure: LEFT HEART CATHETERIZATION WITH CORONARY ANGIOGRAM;  Surgeon: Jolaine Artist, MD;  Location: [facility] CATH LAB;  Service: Cardiovascular;  Laterality: N/A;  . [place]   L4-5  . PACEMAKER REMOVAL  11/2018   due to infection around pacemaker  . PERCUTANEOUS CORONARY STENT INTERVENTION (PCI-S) N/A 07/23/2012   Procedure: PERCUTANEOUS CORONARY STENT INTERVENTION  (PCI-S);  Surgeon: Sherren Mocha, MD;  Location: [facility] C[facility] LAB;  Service: Cardiovascular;  Laterality: N/A;  . PERIPHERAL VASCULAR BALLOON ANGIOPLASTY Left 10/06/2018   Procedure: PERIPHERAL VASCULAR BALLOON ANGIOPLASTY;  Surgeon: Katha Cabal, MD;  Location: [place] CV LAB;  Service: Cardiovascular;  Laterality: Left;  . PERIPHERAL VASCULAR CATHETERIZATION N/A 02/10/2015   Procedure: Picc Line Insertion;  Surgeon: Katha Cabal, MD;  Location: [place] CV LAB;  Service: Cardiovascular;  Laterality: N/A;  . RIGHT HEART CATH N/A 03/29/2019   Procedure: RIGHT HEART CATH;  Surgeon: Jolaine Artist, MD;  Location: [facility] CV LAB;  Service: Cardiovascular;  Laterality: N/A;  . TEE WITHOUT CARDIOVERSION N/A 10/02/2018   Procedure: TRANSESOPHAGEAL ECHOCARDIOGRAM (TEE);  Surgeon: Wellington Hampshire, MD;  Location: [facility] ORS;  Service: Cardiovascular;  Laterality: N/A;  . [place]  . VAGINAL HYSTERECTOMY  01/2011   Fibroids/DUB.  Ovaries removed. [place].    Current Meds  Medication Sig  . amiodarone (PACERONE) 200  MG tablet Take 200 mg by mouth daily.  . Calcium Carbonate-Vitamin D3 (CALCIUM 600-D) 600-400 MG-UNIT TABS Take 1 tablet by mouth 2 (two) times a day.  . clopidogrel (PLAVIX) 75 MG tablet Take 1 tablet (75 mg total) by mouth daily.  Marland Kitchen docusate sodium (COLACE) 100 MG capsule Take 1 capsule (100 mg total) by mouth 2 (two) times daily.  . ferrous sulfate 325 (65 FE) MG tablet Take 325 mg by mouth 2 (two) times daily with a meal.  . gabapentin (NEURONTIN) 600 MG tablet Take 1 tablet (600 mg total) by mouth 2 (two) times daily. Takes 1 in the morning and two at bedtime  . glucose blood (ONE TOUCH ULTRA TEST) test strip USE AS DIRECTED TO CHECK BLOOD SUGAR 3 TIMES DAILY  . hydrALAZINE (APRESOLINE) 25 MG tablet Take 1/2 tablet by mouth 3 times a day.  . insulin glargine (LANTUS) 100 UNIT/ML injection Inject 0.16 mLs (16 Units total) into the skin at bedtime.  .  isosorbide mononitrate (IMDUR) 30 MG 24 hr tablet Take 1 tablet (30 mg total) by mouth daily.  Marland Kitchen levothyroxine (SYNTHROID) 25 MCG tablet Take 1 tablet (25 mcg total) by mouth daily before breakfast.  . Multiple Vitamin (MULTIVITAMIN) tablet Take 1 tablet by mouth daily.  . Omega-3 Fatty Acids (FISH OIL) 1000 MG CAPS Take 1 capsule by mouth 2 (two) times a day.   . rosuvastatin (CRESTOR) 10 MG tablet Take 1 tablet (10 mg total) by mouth daily.  Marland Kitchen torsemide (DEMADEX) 20 MG tablet Take 2 tablets (40 mg) in the AM and 1 tabler (20mg ) in the PM  . traMADol (ULTRAM) 50 MG tablet Take 1 tablet (50 mg total) by mouth every 6 (six) hours as needed. (Patient taking differently: Take 50 mg by mouth every 6 (six) hours as needed for moderate pain. )    No Known Allergies    Review of Systems negative except from HPI and PMH  Physical Exam BP 122/68 (BP Location: Left Arm, Patient Position: Sitting, Cuff Size: Normal)   Pulse (!) 58   Ht 5\' 8"  (1.727 m)   Wt 234 lb (106.1 kg)   BMI 35.58 kg/m   .Well developed and well nourished in no acute distress HENT normal Neck supple with JVP-flat Clear Device pocket well healed; without hematoma or erythema.  There is no tethering  Regular rate and rhythm, no  gallop No  murmur Abd-soft with active BS No Clubbing cyanosis   edema Skin-warm and dry A & Oriented  Grossly normal sensory and motor function  ECG sinus w P-synchronous/ AV  pacing with 3 different morphologies, the dominant of whichis upright I and Neg VI but the BIV paced is neg Lead 1 and neg V1  Reprogramming upright lead 1 and Rs V1 QRSd 175 msec     Assessment and  Plan  CRT-D recent reimplantation following extraction  Nonischemic myopathy  Congestive heart failure-chronic-systolic class II  Pocket hematoma  Hypothyroidism  High Risk Medication Surveillance  PVCs  Functional status is much improved  Euvolemic continue current meds  With reprogramming of vectors  the QRS morphologies have changed signficantly so will need to keep a close eye on functional status and do an echo now, so we have a baseline  Continue current meds  Will recheck TSH today on replacement   May need uptitration    We spent more than 50% of our >25 min visit in face to face counseling regarding the above

## 2019-06-29 NOTE — Patient Instructions (Addendum)
Medication Instructions:  - Your physician recommends that you continue on your current medications as directed. Please refer to the Current Medication list given to you today.  If you need a refill on your cardiac medications before your next appointment, please call your pharmacy.   Lab work: - none ordered  If you have labs (blood work) drawn today and your tests are completely normal, you will receive your results only by: Marland Kitchen MyChart Message (if you have MyChart) OR . A paper copy in the mail If you have any lab test that is abnormal or we need to change your treatment, we will call you to review the results.  Testing/Procedures: - Your physician has requested that you have an echocardiogram (now & in December). Echocardiography is a painless test that uses sound waves to create images of your heart. It provides your doctor with information about the size and shape of your heart and how well your heart's chambers and valves are working. This procedure takes approximately one hour. There are no restrictions for this procedure.  Follow-Up: At [facility], you and your health needs are our priority.  As part of our continuing mission to provide you with exceptional heart care, we have created designated Provider Care Teams.  These Care Teams include your primary Cardiologist (physician) and Advanced Practice Providers (APPs -  Physician Assistants and Nurse Practitioners) who all work together to provide you with the care you need, when you need it. . in December with Dr. Caryl Comes  Any Other Special Instructions Will Be Listed Below (If Applicable). - N/A

## 2019-07-01 ENCOUNTER — Ambulatory Visit (INDEPENDENT_AMBULATORY_CARE_PROVIDER_SITE_OTHER): Payer: PPO | Admitting: *Deleted

## 2019-07-01 DIAGNOSIS — I255 Ischemic cardiomyopathy: Secondary | ICD-10-CM | POA: Diagnosis not present

## 2019-07-01 LAB — CUP PACEART REMOTE DEVICE CHECK
Battery Remaining Longevity: 93 mo
Battery Voltage: 3 V
Brady Statistic AP VP Percent: 6.46 %
Brady Statistic AP VS Percent: 0.06 %
Brady Statistic AS VP Percent: 93.38 %
Brady Statistic AS VS Percent: 0.1 %
Brady Statistic RA Percent Paced: 6.52 %
Brady Statistic RV Percent Paced: 98.61 %
Date Time Interrogation Session: 20200924071707
HighPow Impedance: 44 Ohm
Implantable Lead Implant Date: 20200625
Implantable Lead Implant Date: 20200625
Implantable Lead Implant Date: 20200625
Implantable Lead Location: 753858
Implantable Lead Location: 753859
Implantable Lead Location: 753860
Implantable Lead Model: 4398
Implantable Lead Model: 5076
Implantable Lead Model: 6935
Implantable Pulse Generator Implant Date: 20200625
Lead Channel Impedance Value: 184.154
Lead Channel Impedance Value: 195.429
Lead Channel Impedance Value: 212.8 Ohm
Lead Channel Impedance Value: 223.615
Lead Channel Impedance Value: 267.31 Ohm
Lead Channel Impedance Value: 342 Ohm
Lead Channel Impedance Value: 361 Ohm
Lead Channel Impedance Value: 399 Ohm
Lead Channel Impedance Value: 399 Ohm
Lead Channel Impedance Value: 418 Ohm
Lead Channel Impedance Value: 456 Ohm
Lead Channel Impedance Value: 646 Ohm
Lead Channel Impedance Value: 665 Ohm
Lead Channel Impedance Value: 703 Ohm
Lead Channel Impedance Value: 722 Ohm
Lead Channel Impedance Value: 760 Ohm
Lead Channel Impedance Value: 893 Ohm
Lead Channel Impedance Value: 893 Ohm
Lead Channel Pacing Threshold Amplitude: 0.625 V
Lead Channel Pacing Threshold Amplitude: 1 V
Lead Channel Pacing Threshold Amplitude: 1.375 V
Lead Channel Pacing Threshold Pulse Width: 0.4 ms
Lead Channel Pacing Threshold Pulse Width: 0.4 ms
Lead Channel Pacing Threshold Pulse Width: 1 ms
Lead Channel Sensing Intrinsic Amplitude: 0.25 mV
Lead Channel Sensing Intrinsic Amplitude: 0.25 mV
Lead Channel Sensing Intrinsic Amplitude: 13.875 mV
Lead Channel Sensing Intrinsic Amplitude: 13.875 mV
Lead Channel Setting Pacing Amplitude: 2 V
Lead Channel Setting Pacing Amplitude: 2 V
Lead Channel Setting Pacing Amplitude: 2 V
Lead Channel Setting Pacing Pulse Width: 0.4 ms
Lead Channel Setting Pacing Pulse Width: 1 ms
Lead Channel Setting Sensing Sensitivity: 0.3 mV

## 2019-07-06 DIAGNOSIS — J189 Pneumonia, unspecified organism: Secondary | ICD-10-CM | POA: Diagnosis not present

## 2019-07-06 DIAGNOSIS — M86171 Other acute osteomyelitis, right ankle and foot: Secondary | ICD-10-CM | POA: Diagnosis not present

## 2019-07-06 DIAGNOSIS — G4733 Obstructive sleep apnea (adult) (pediatric): Secondary | ICD-10-CM | POA: Diagnosis not present

## 2019-07-06 DIAGNOSIS — I5022 Chronic systolic (congestive) heart failure: Secondary | ICD-10-CM | POA: Diagnosis not present

## 2019-07-08 ENCOUNTER — Other Ambulatory Visit: Payer: PPO | Admitting: Nurse Practitioner

## 2019-07-08 ENCOUNTER — Encounter: Payer: Self-pay | Admitting: Cardiology

## 2019-07-08 ENCOUNTER — Other Ambulatory Visit: Payer: Self-pay

## 2019-07-08 ENCOUNTER — Telehealth: Payer: Self-pay | Admitting: Nurse Practitioner

## 2019-07-08 DIAGNOSIS — L97521 Non-pressure chronic ulcer of other part of left foot limited to breakdown of skin: Secondary | ICD-10-CM | POA: Diagnosis not present

## 2019-07-08 DIAGNOSIS — R809 Proteinuria, unspecified: Secondary | ICD-10-CM | POA: Diagnosis not present

## 2019-07-08 DIAGNOSIS — N183 Chronic kidney disease, stage 3 unspecified: Secondary | ICD-10-CM | POA: Diagnosis not present

## 2019-07-08 DIAGNOSIS — E1142 Type 2 diabetes mellitus with diabetic polyneuropathy: Secondary | ICD-10-CM | POA: Diagnosis not present

## 2019-07-08 DIAGNOSIS — N179 Acute kidney failure, unspecified: Secondary | ICD-10-CM | POA: Diagnosis not present

## 2019-07-08 DIAGNOSIS — I10 Essential (primary) hypertension: Secondary | ICD-10-CM | POA: Diagnosis not present

## 2019-07-08 NOTE — Progress Notes (Signed)
Remote ICD transmission.   

## 2019-07-08 NOTE — Telephone Encounter (Signed)
I called [name] for scheduled telemedicine visit, no answer, message left to re contact to reschedule.

## 2019-07-12 ENCOUNTER — Telehealth: Payer: Self-pay | Admitting: *Deleted

## 2019-07-12 NOTE — Telephone Encounter (Signed)

## 2019-07-13 ENCOUNTER — Other Ambulatory Visit: Payer: Self-pay

## 2019-07-15 ENCOUNTER — Other Ambulatory Visit: Payer: Self-pay

## 2019-07-18 NOTE — Progress Notes (Signed)
Virtual Visit via Video Note   This visit type was conducted due to national recommendations for restrictions regarding the COVID-19 Pandemic (e.g. social distancing) in an effort to limit this patient's exposure and mitigate transmission in our community.  Due to her co-morbid illnesses, this patient is at least at moderate risk for complications without adequate follow up.  This format is felt to be most appropriate for this patient at this time.  All issues noted in this document were discussed and addressed.  A limited physical exam was performed with this format.  Please refer to the patient's chart for her consent to telehealth for [facility].   Evaluation Performed:  Follow-up visit  This visit type was conducted due to national recommendations for restrictions regarding the COVID-19 Pandemic (e.g. social distancing).  This format is felt to be most appropriate for this patient at this time.  All issues noted in this document were discussed and addressed.  No physical exam was performed (except for noted visual exam findings with Video Visits).  Please refer to the patient's chart (MyChart message for video visits and phone note for telephone visits) for the patient's consent to telehealth for [facility].  Date:  07/19/2019   ID:  [name], DOB 18-Apr-1956, MRN [id number]  Patient Location:  Home  Provider location:   [place]  PCP:  Rutherford Guys, MD  Cardiologist:  Ida Rogue, MD  Sleep Medicine:  Fransico Him, MD Electrophysiologist:  Virl Axe, MD   Chief Complaint:  OSA  History of Present Illness:    [name] is a 63 y.o. female who presents via audio/video conferencing for a telehealth visit today.    This is a 63yo female with a history of ASCAD, CKD, chronic systolic CHF andModerateOSAwith AHI 23/hr titrated to CPAP at 16cm H2O.PSG showed moderate OSA with an overall AHI of 23 events per hour and 87 events per hour during REM  sleep. She had reduced sleep efficiency with increased frequency of arousals from respiratory events and moderate snoring. She underwent CPAP titration to 16cm H2O.  I saw her 01/2019 and she had not been using her PAP for 2 years.  We ordered a new mask and restarted her on auto PAP.  She was seen back 04/2019 and was having problems with her mask which was a nasal mask and she breaths through her mouth.  She was changed to a Respironics Dreamwear nasal mask and placed on auto PAP with overnight pulse ox which showed no nocturnal hypoxemia.  She is doing well with her CPAP device and thinks that she has gotten used to it.  She tolerates the nasal mask and chin strap and feels the pressure is adequate.  Since going on CPAP she feels rested in the am and has no significant daytime sleepiness.  She denies any significant mouth or nasal dryness or nasal congestion.  She does not think that he snores.    The patient does not have symptoms concerning for COVID-19 infection (fever, chills, cough, or new shortness of breath).    Prior CV studies:   The following studies were reviewed today:  PAP compliance download  Past Medical History:  Diagnosis Date   Acute myocardial infarction, subendocardial infarction, initial episode of care ([place]) 07/21/2012   Acute osteomyelitis involving ankle and foot ([place]) 12/11/6281   Acute systolic heart failure ([place]) 07/21/2012   New onset 07/19/12; admission to [facility] ED. Elevated Troponins.  S/p 2D-echo with EF 20-25%.  S/p cardiac catheterization with stenting LAD.  Repeat 2D-echo 10/2011 with improved EF of 35%.    Anemia    Automatic implantable cardioverter-defibrillator in situ    a. MDT CRT-D 06/2014, SN: [id number]  .removed in feb 2020   CAD (coronary artery disease)    a. cardiac cath 101/04/2012: PCI/DES to chronically occluded mLAD, consideration PCI to diag branch in 4 weeks.    Cataract    Chicken pox    Chronic kidney disease    Chronic  systolic CHF (congestive heart failure) (HCC)    a. mixed ICM & NICM; b. EF 20-25% by echo 07/2012, mid-dist 2/3 of LV sev HK/AK, mild MR. echo 10/2012: EF 30-35%, sev HK ant-septal & inf walls, GR1DD, mild MR, PASP 33. c. echo 02/2013: EF 30%, GR1DD, mild MR. echo 04/2014: EF 30%, Septal-lat dyssynchrony, global HK, inf AK, GR1DD, mild MR. d. echo 10/2014: EF50-55%, WM nl, GR1DD, septal mild paradox. e. echo 02/2015: EF 50-55%, wm    Depression    Heart attack ([place])    Heel ulcer ([place]) 04/27/2015   History of blood transfusion ~ 2011   "plasma; had neuropathy; couldn't walk"   Hypertension    LBBB (left bundle branch block)    Neuromuscular disorder ([place])    Neuropathy 2011   Obesity, unspecified    OSA on CPAP    Moderate with AHI 23/hr and now on CPAP at 16cm H2O.  Her DME is [facility]   Pure hypercholesterolemia    Sepsis ([place]) 09/2018   Type II diabetes mellitus ([place])    Unspecified vitamin D deficiency    Past Surgical History:  Procedure Laterality Date   AMPUTATION TOE Right 06/18/2015   Procedure: AMPUTATION TOE;  Surgeon: Samara Deist, DPM;  Location: [facility] ORS;  Service: Podiatry;  Laterality: Right;   AMPUTATION TOE Left 10/08/2018   Procedure: AMPUTATION TOE LEFT 2ND;  Surgeon: Albertine Patricia, DPM;  Location: [facility] ORS;  Service: Podiatry;  Laterality: Left;   BACK SURGERY     BI-VENTRICULAR IMPLANTABLE CARDIOVERTER DEFIBRILLATOR N/A 07/06/2014   Procedure: BI-VENTRICULAR IMPLANTABLE CARDIOVERTER DEFIBRILLATOR  (CRT-D);  Surgeon: Deboraha Sprang, MD;  Location[facility] CATH LAB;  Service: Cardiovascular;  Laterality: N/A;   BI-VENTRICULAR IMPLANTABLE CARDIOVERTER DEFIBRILLATOR  (CRT-D)  07/06/2014   BILATERAL OOPHORECTOMY  01/2011   ovarian cyst benign   BIV ICD INSERTION CRT-D N/A 03/31/2019   Procedure: BIV ICD INSERTION CRT-D;  Surgeon: Constance Haw, MD;  Location: [place] CV LAB;  Service: Cardiovascular;  Laterality: N/A;   COLONOSCOPY WITH PROPOFOL Left  02/22/2015   Procedure: COLONOSCOPY WITH PROPOFOL;  Surgeon: Hulen Luster, MD;  Location: [facility] ENDOSCOPY;  Service: Endoscopy;  Laterality: Left;   CORONARY ANGIOPLASTY WITH STENT PLACEMENT Left 07/2012   new onset systolic CHF; elevated troponins.  Cardiac catheterization with stenting to LAD; EF 15%.  2D-echo: EF 20-25%.   ESOPHAGOGASTRODUODENOSCOPY N/A 02/22/2015   Procedure: ESOPHAGOGASTRODUODENOSCOPY (EGD);  Surgeon: Hulen Luster, MD;  Location: [facility] ENDOSCOPY;  Service: Endoscopy;  Laterality: N/A;   INCISION AND DRAINAGE ABSCESS Right 2007   groin; with ICU stay due to sepsis.   LAPAROSCOPIC CHOLECYSTECTOMY  2011   LEFT HEART CATH AND CORONARY ANGIOGRAPHY N/A 10/05/2018   Procedure: LEFT HEART CATH AND CORONARY ANGIOGRAPHY;  Surgeon: Minna Merritts, MD;  Location: [place] CV LAB;  Service: Cardiovascular;  Laterality: N/A;   LEFT HEART CATHETERIZATION WITH CORONARY ANGIOGRAM N/A 07/21/2012   Procedure: LEFT HEART CATHETERIZATION WITH CORONARY ANGIOGRAM;  Surgeon: Quillian Quince  R Bensimhon, MD;  Location: [facility] CATH LAB;  Service: Cardiovascular;  Laterality: N/A;   LUMBAR [place]   L4-5   PACEMAKER REMOVAL  11/2018   due to infection around pacemaker   [place] (PCI-S) N/A 07/23/2012   Procedure: PERCUTANEOUS CORONARY STENT INTERVENTION (PCI-S);  Surgeon: Sherren Mocha, MD;  Location[facility] CATH LAB;  Service: Cardiovascular;  Laterality: N/A;   PERIPHERAL VASCULAR BALLOON ANGIOPLASTY Left 10/06/2018   Procedure: PERIPHERAL VASCULAR BALLOON ANGIOPLASTY;  Surgeon: Katha Cabal, MD;  Location: [place] CV LAB;  Service: Cardiovascular;  Laterality: Left;   PERIPHERAL VASCULAR CATHETERIZATION N/A 02/10/2015   Procedure: Picc Line Insertion;  Surgeon: Katha Cabal, MD;  Location: [place] CV LAB;  Service: Cardiovascular;  Laterality: N/A;   RIGHT HEART CATH N/A 03/29/2019   Procedure: RIGHT HEART CATH;  Surgeon: Jolaine Artist, MD;  Location: [place] CV LAB;  Service: Cardiovascular;  Laterality: N/A;   TEE WITHOUT CARDIOVERSION N/A 10/02/2018   Procedure: TRANSESOPHAGEAL ECHOCARDIOGRAM (TEE);  Surgeon: Wellington Hampshire, MD;  Location: [facility] ORS;  Service: Cardiovascular;  Laterality: N/A;   [place]  01/2011   Fibroids/DUB.  Ovaries removed. [place].     Current Meds  Medication Sig   amiodarone (PACERONE) 200 MG tablet Take 200 mg by mouth daily.   Calcium Carbonate-Vitamin D3 (CALCIUM 600-D) 600-400 MG-UNIT TABS Take 1 tablet by mouth 2 (two) times a day.   clopidogrel (PLAVIX) 75 MG tablet Take 1 tablet (75 mg total) by mouth daily.   docusate sodium (COLACE) 100 MG capsule Take 1 capsule (100 mg total) by mouth 2 (two) times daily.   ferrous sulfate 325 (65 FE) MG tablet Take 325 mg by mouth 2 (two) times daily with a meal.   gabapentin (NEURONTIN) 600 MG tablet Take 1 tablet (600 mg total) by mouth 2 (two) times daily. Takes 1 in the morning and two at bedtime   glucose blood (ONE TOUCH ULTRA TEST) test strip USE AS DIRECTED TO CHECK BLOOD SUGAR 3 TIMES DAILY   hydrALAZINE (APRESOLINE) 25 MG tablet Take 1/2 tablet by mouth 3 times a day.   insulin glargine (LANTUS) 100 UNIT/ML injection Inject 0.16 mLs (16 Units total) into the skin at bedtime.   isosorbide mononitrate (IMDUR) 30 MG 24 hr tablet Take 1 tablet (30 mg total) by mouth daily.   levothyroxine (SYNTHROID) 25 MCG tablet Take 1 tablet (25 mcg total) by mouth daily before breakfast.   Multiple Vitamin (MULTIVITAMIN) tablet Take 1 tablet by mouth daily.   Omega-3 Fatty Acids (FISH OIL) 1000 MG CAPS Take 1 capsule by mouth 2 (two) times a day.    rosuvastatin (CRESTOR) 10 MG tablet Take 1 tablet (10 mg total) by mouth daily.   torsemide (DEMADEX) 20 MG tablet Take 80 mg by mouth daily.   traMADol (ULTRAM) 50 MG tablet Take 1 tablet (50 mg total) by mouth every 6 (six) hours as needed.  (Patient taking differently: Take 50 mg by mouth every 6 (six) hours as needed for moderate pain. )     Allergies:   Patient has no known allergies.   Social History   Tobacco Use   Smoking status: Never Smoker   Smokeless tobacco: Never Used  Substance Use Topics   Alcohol use: No    Alcohol/week: 0.0 standard drinks   Drug use: No     Family Hx: The patient's family history includes Arthritis  in her mother; COPD in her maternal grandmother; Cancer in her father and maternal grandfather; Cancer (age of onset: 84) in her brother; Diabetes in her maternal grandmother, mother, paternal grandfather, paternal grandmother, and son; Heart disease in her maternal grandmother; Hypertension in her mother and son; Obesity in her brother.  ROS:   Please see the history of present illness.     All other systems reviewed and are negative.   Labs/Other Tests and Data Reviewed:    Recent Labs: 03/23/2019: ALT 22 03/30/2019: Magnesium 2.2 04/14/2019: B Natriuretic Peptide 1,604.1; Hemoglobin 9.7; Platelets 199 05/18/2019: TSH 21.400 06/08/2019: BUN 50; Creatinine, Ser 2.02; Potassium 4.3; Sodium 140   Recent Lipid Panel Lab Results  Component Value Date/Time   CHOL 82 (L) 02/03/2019 04:11 PM   TRIG 101 02/03/2019 04:11 PM   HDL 37 (L) 02/03/2019 04:11 PM   CHOLHDL 2.2 02/03/2019 04:11 PM   CHOLHDL 5.6 (H) 08/06/2016 02:31 PM   LDLCALC 25 02/03/2019 04:11 PM    Wt Readings from Last 3 Encounters:  07/19/19 237 lb (107.5 kg)  06/29/19 234 lb (106.1 kg)  06/08/19 240 lb 6.4 oz (109 kg)     Objective:    Vital Signs:  BP 120/68    Ht 5\' 8"  (1.727 m)    Wt 237 lb (107.5 kg)    SpO2 99%    BMI 36.04 kg/m    CONSTITUTIONAL:  Well nourished, well developed female in no acute distress.  EYES: anicteric MOUTH: oral mucosa is pink RESPIRATORY: Normal respiratory effort, symmetric expansion CARDIOVASCULAR: No peripheral edema SKIN: No rash, lesions or ulcers MUSCULOSKELETAL: no digital  cyanosis NEURO: Cranial Nerves II-XII grossly intact, moves all extremities PSYCH: Intact judgement and insight.  A&O x 3, Mood/affect appropriate   ASSESSMENT & PLAN:    1.  OSA - The patient is tolerating PAP therapy well without any problems. The PAP download was reviewed today and showed an AHI of 1.2/hr on 12 cm H2O with 87% compliance in using more than 4 hours nightly.  The patient has been using and benefiting from PAP use and will continue to benefit from therapy.   2.  HTN -BP controlled -continue Hydralazine 12.5mg  TID, Imdur 30mg  daily  3.  Obesity -she is exercising and watching her diet  COVID-19 Education: The signs and symptoms of COVID-19 were discussed with the patient and how to seek care for testing (follow up with PCP or arrange E-visit).  The importance of social distancing was discussed today.  Patient Risk:   After full review of this patient's clinical status, I feel that they are at least moderate risk at this time.  Time:   Today, I have spent 20 minutes directly with the patient on telemedicine discussing medical problems including OSA, HTN and Obesity.  We also reviewed the symptoms of COVID 19 and the ways to protect against contracting the virus with telehealth technology.  I spent an additional 5 minutes reviewing patient's chart including PAP compliance download.  Medication Adjustments/Labs and Tests Ordered: Current medicines are reviewed at length with the patient today.  Concerns regarding medicines are outlined above.  Tests Ordered: No orders of the defined types were placed in this encounter.  Medication Changes: No orders of the defined types were placed in this encounter.   Disposition:  Follow up in 1 year(s)  Signed, Fransico Him, MD  07/19/2019 9:48 AM    [facility]

## 2019-07-19 ENCOUNTER — Other Ambulatory Visit: Payer: Self-pay

## 2019-07-19 ENCOUNTER — Telehealth (INDEPENDENT_AMBULATORY_CARE_PROVIDER_SITE_OTHER): Payer: PPO | Admitting: Cardiology

## 2019-07-19 VITALS — BP 120/68 | Ht 68.0 in | Wt 237.0 lb

## 2019-07-19 DIAGNOSIS — G4733 Obstructive sleep apnea (adult) (pediatric): Secondary | ICD-10-CM | POA: Diagnosis not present

## 2019-07-19 DIAGNOSIS — E669 Obesity, unspecified: Secondary | ICD-10-CM

## 2019-07-19 DIAGNOSIS — I10 Essential (primary) hypertension: Secondary | ICD-10-CM | POA: Diagnosis not present

## 2019-07-19 NOTE — Patient Instructions (Signed)
Medication Instructions:  Your physician recommends that you continue on your current medications as directed. Please refer to the Current Medication list given to you today.  If you need a refill on your cardiac medications before your next appointment, please call your pharmacy.   Lab work: none If you have labs (blood work) drawn today and your tests are completely normal, you will receive your results only by: Marland Kitchen MyChart Message (if you have MyChart) OR . A paper copy in the mail If you have any lab test that is abnormal or we need to change your treatment, we will call you to review the results.  Testing/Procedures: none  Follow-Up: Your physician wants you to follow-up in: 12 months with Dr Radford Pax.You will receive a reminder letter in the mail two months in advance. If you don't receive a letter, please call our office to schedule the follow-up appointment.

## 2019-07-20 ENCOUNTER — Other Ambulatory Visit: Payer: Self-pay | Admitting: Family Medicine

## 2019-07-20 DIAGNOSIS — E1122 Type 2 diabetes mellitus with diabetic chronic kidney disease: Secondary | ICD-10-CM | POA: Diagnosis not present

## 2019-07-20 DIAGNOSIS — R768 Other specified abnormal immunological findings in serum: Secondary | ICD-10-CM | POA: Insufficient documentation

## 2019-07-20 DIAGNOSIS — I129 Hypertensive chronic kidney disease with stage 1 through stage 4 chronic kidney disease, or unspecified chronic kidney disease: Secondary | ICD-10-CM | POA: Diagnosis not present

## 2019-07-20 DIAGNOSIS — R809 Proteinuria, unspecified: Secondary | ICD-10-CM | POA: Diagnosis not present

## 2019-07-20 DIAGNOSIS — N184 Chronic kidney disease, stage 4 (severe): Secondary | ICD-10-CM | POA: Diagnosis not present

## 2019-07-20 DIAGNOSIS — D631 Anemia in chronic kidney disease: Secondary | ICD-10-CM | POA: Insufficient documentation

## 2019-07-20 DIAGNOSIS — E875 Hyperkalemia: Secondary | ICD-10-CM | POA: Insufficient documentation

## 2019-07-20 DIAGNOSIS — N2581 Secondary hyperparathyroidism of renal origin: Secondary | ICD-10-CM | POA: Diagnosis not present

## 2019-07-20 DIAGNOSIS — N189 Chronic kidney disease, unspecified: Secondary | ICD-10-CM | POA: Insufficient documentation

## 2019-07-20 MED ORDER — CLOPIDOGREL BISULFATE 75 MG PO TABS: 75.0000 mg | ORAL_TABLET | Freq: Every day | ORAL | 0 refills | Status: DC

## 2019-07-20 NOTE — Telephone Encounter (Signed)
Medication Refill - Medication: clopidogrel (PLAVIX) 75 MG tablet    Preferred Pharmacy (with phone number or street name):  [facility] [address]  [address] [place] [address]  Phone: [phone] Fax: [phone]     Agent: Please be advised that RX refills may take up to 3 business days. We ask that you follow-up with your pharmacy.

## 2019-07-21 ENCOUNTER — Telehealth: Payer: Self-pay | Admitting: Internal Medicine

## 2019-07-21 NOTE — Telephone Encounter (Signed)
° °  [facility] Pre-operative Risk Assessment    Request for surgical clearance:  1. What type of surgery is being performed? Dental Extraction  2. When is this surgery scheduled? ASAP   3. What type of clearance is required (medical clearance vs. Pharmacy clearance to hold med vs. Both)? both  4. Are there any medications that need to be held prior to surgery and how long?  Blood thinners - when to stop and how long?   5. Practice name and name of physician performing surgery?  [facility], Dr Payton Spark  6. What is your office phone number [phone]    7.   What is your office fax number [phone]  8.   Anesthesia type (None, local, MAC, general) ? Local    Oluwadamilola Deliz 07/21/2019, 11:02 AM  _________________________________________________________________   (provider comments below)

## 2019-07-21 NOTE — Telephone Encounter (Signed)
Called the requesting office and will call back tomorrow the office closes at 4:30 Mon-Thur.

## 2019-07-21 NOTE — Telephone Encounter (Signed)
Per Dr. Darol Destine office [facility] ) 2 teeth to be extracted asap

## 2019-07-21 NOTE — Telephone Encounter (Signed)
Please verify how many teeth are being extracted

## 2019-07-22 NOTE — Telephone Encounter (Signed)
[facility] calling back regarding clearance. Please call ASAP

## 2019-07-22 NOTE — Telephone Encounter (Signed)
   Primary Cardiologist: Ida Rogue, MD  Chart reviewed as part of pre-operative protocol coverage. Simple dental extractions are considered low risk procedures per guidelines and generally do not require any specific cardiac clearance. It is also generally accepted that for simple extractions and dental cleanings, there is no need to interrupt blood thinner therapy.   SBE prophylaxis is not required for the patient.  I will route this recommendation to the requesting party via Epic fax function and remove from pre-op pool.  Please call with questions.  Rhonda Barrett, PA-C 07/22/2019, 10:06 AM   5.   [facility], Dr Payton Spark  6. What is your office phone number [phone]    7.   What is your office fax number [phone]

## 2019-07-23 ENCOUNTER — Telehealth: Payer: Self-pay | Admitting: Nurse Practitioner

## 2019-07-23 ENCOUNTER — Other Ambulatory Visit: Payer: Self-pay

## 2019-07-23 ENCOUNTER — Encounter (HOSPITAL_COMMUNITY): Payer: Self-pay

## 2019-07-23 ENCOUNTER — Ambulatory Visit (HOSPITAL_COMMUNITY)
Admission: RE | Admit: 2019-07-23 | Discharge: 2019-07-23 | Disposition: A | Discharge status: Home / Self Care | Payer: PPO | Source: Ambulatory Visit | Attending: Adult Health | Admitting: Adult Health

## 2019-07-23 VITALS — BP 128/62 | HR 58 | Wt 243.0 lb

## 2019-07-23 DIAGNOSIS — I10 Essential (primary) hypertension: Secondary | ICD-10-CM | POA: Diagnosis not present

## 2019-07-23 DIAGNOSIS — E7800 Pure hypercholesterolemia, unspecified: Secondary | ICD-10-CM | POA: Diagnosis not present

## 2019-07-23 DIAGNOSIS — I493 Ventricular premature depolarization: Secondary | ICD-10-CM | POA: Insufficient documentation

## 2019-07-23 DIAGNOSIS — Z79890 Hormone replacement therapy (postmenopausal): Secondary | ICD-10-CM | POA: Diagnosis not present

## 2019-07-23 DIAGNOSIS — I5022 Chronic systolic (congestive) heart failure: Secondary | ICD-10-CM | POA: Diagnosis not present

## 2019-07-23 DIAGNOSIS — E1122 Type 2 diabetes mellitus with diabetic chronic kidney disease: Secondary | ICD-10-CM | POA: Insufficient documentation

## 2019-07-23 DIAGNOSIS — E1142 Type 2 diabetes mellitus with diabetic polyneuropathy: Secondary | ICD-10-CM | POA: Diagnosis not present

## 2019-07-23 DIAGNOSIS — Z8249 Family history of ischemic heart disease and other diseases of the circulatory system: Secondary | ICD-10-CM | POA: Insufficient documentation

## 2019-07-23 DIAGNOSIS — E1136 Type 2 diabetes mellitus with diabetic cataract: Secondary | ICD-10-CM | POA: Insufficient documentation

## 2019-07-23 DIAGNOSIS — Z79899 Other long term (current) drug therapy: Secondary | ICD-10-CM | POA: Diagnosis not present

## 2019-07-23 DIAGNOSIS — I428 Other cardiomyopathies: Secondary | ICD-10-CM | POA: Diagnosis not present

## 2019-07-23 DIAGNOSIS — I447 Left bundle-branch block, unspecified: Secondary | ICD-10-CM | POA: Diagnosis not present

## 2019-07-23 DIAGNOSIS — Z955 Presence of coronary angioplasty implant and graft: Secondary | ICD-10-CM | POA: Diagnosis not present

## 2019-07-23 DIAGNOSIS — N183 Chronic kidney disease, stage 3 unspecified: Secondary | ICD-10-CM | POA: Diagnosis not present

## 2019-07-23 DIAGNOSIS — Z794 Long term (current) use of insulin: Secondary | ICD-10-CM | POA: Diagnosis not present

## 2019-07-23 DIAGNOSIS — I130 Hypertensive heart and chronic kidney disease with heart failure and stage 1 through stage 4 chronic kidney disease, or unspecified chronic kidney disease: Secondary | ICD-10-CM | POA: Diagnosis not present

## 2019-07-23 DIAGNOSIS — I252 Old myocardial infarction: Secondary | ICD-10-CM | POA: Diagnosis not present

## 2019-07-23 DIAGNOSIS — I2510 Atherosclerotic heart disease of native coronary artery without angina pectoris: Secondary | ICD-10-CM | POA: Diagnosis not present

## 2019-07-23 DIAGNOSIS — I5082 Biventricular heart failure: Secondary | ICD-10-CM | POA: Diagnosis not present

## 2019-07-23 DIAGNOSIS — E669 Obesity, unspecified: Secondary | ICD-10-CM

## 2019-07-23 DIAGNOSIS — I472 Ventricular tachycardia: Secondary | ICD-10-CM | POA: Insufficient documentation

## 2019-07-23 DIAGNOSIS — G4733 Obstructive sleep apnea (adult) (pediatric): Secondary | ICD-10-CM | POA: Diagnosis not present

## 2019-07-23 DIAGNOSIS — Z6836 Body mass index (BMI) 36.0-36.9, adult: Secondary | ICD-10-CM | POA: Insufficient documentation

## 2019-07-23 DIAGNOSIS — Z95810 Presence of automatic (implantable) cardiac defibrillator: Secondary | ICD-10-CM | POA: Diagnosis not present

## 2019-07-23 MED ORDER — LOSARTAN POTASSIUM 25 MG PO TABS: 12.5000 mg | ORAL_TABLET | Freq: Every day | ORAL | 3 refills | Status: DC

## 2019-07-23 NOTE — Telephone Encounter (Signed)
Called patient to schedule a Palliative f/u visit, no answer - left message with reason for call along with my contact information 

## 2019-07-23 NOTE — Patient Instructions (Signed)
START Losartan 12.5mg  tab daily.  Please have labs drawn at [facility] when you have your echo.   Please follow up with the [facility] in 3 months.  At the [facility], you and your health needs are our priority. As part of our continuing mission to provide you with exceptional heart care, we have created designated Provider Care Teams. These Care Teams include your primary Cardiologist (physician) and Advanced Practice Providers (APPs- Physician Assistants and Nurse Practitioners) who all work together to provide you with the care you need, when you need it.   You may see any of the following providers on your designated Care Team at your next follow up: Marland Kitchen Dr Glori Bickers . Dr [name] . Darrick Grinder, NP . Lyda Jester, PA   Please be sure to bring in all your medications bottles to every appointment.

## 2019-07-23 NOTE — Telephone Encounter (Signed)
Rec'd a call back from patient and we have scheduled a Palliative f/u visit for 10/11/18 @ 9:30 AM

## 2019-07-23 NOTE — Progress Notes (Signed)
PCP: Dr Pamella Pert Cardiologist: Dr Radford Pax EP: Dr Caryl Comes  Primary HF Cardiologist: Dr Haroldine Laws  HPI: Vickie Sulewski Gerringeris a 63 y.o.femalewith MI>>LAD DES,LBBB, mixedNICM/ICM combinedsystolic and diastolic CHF,status post CRT-D 2015 with improvement in EF to 40 to 45%>>10-15% after ICD removed 2nd bacteremia,, DM 2, HTN, obesity,PAD w/ PTA L anterior tibial artery 10/06/2018,OSA prescribed CPAP, NSTEMI 12/2019s/p cath w/ med rx for CAD>>2/2 MSSA bacteremia s/p toe amputation, TV and lead vegetations, s/p Angiovac &ICD removal at [facility] .   Admitted on 03/23/19 with A/C biventricular HF. EF improved previously with CRT but now back down again after CRT removal due to sepsis.She had marked volume overload on exam and rising creatinine. Possibly low output. Started on IV lasix + metolazone.Placed on milrinone which was later weaned off. Unfortunately while hospitalized her husband had CVA and passed away on 04-13-23. On 2023-04-13 she underwent CRT-D.  She was discharged with volume overload and worsening renal function, but she is anxious to go home and plan her husbands funeral. Discharge weight 287 pounds. Discharge with [facility].   Today she returns for HF follow up.Overall feeling much better. Says she is getting stronger. Able to walk around the grocery store. SOB with steps. Denies PND/Orthopnea. No chest pain. Appetite ok. No fever or chills. Weight at home 235-237 pounds. Taking all medications.    ROS: All systems negative except as listed in HPI, PMH and Problem List.  SH:  Social History   Socioeconomic History  . Marital status: Married    Spouse name: [name]  . Number of children: 2  . Years of education: 62  . Highest education level: High school graduate  Occupational History  . Occupation: disabled    Fish farm manager: UNEMPLOYED    Comment: 03/2010 for peripheral neuropathy  . 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  Social Needs  . Financial resource strain: Not hard at all   . Food insecurity    Worry: Never true    Inability: Never true  . Transportation needs    Medical: No    Non-medical: No  Tobacco Use  . Smoking status: Never Smoker  . Smokeless tobacco: Never Used  Substance and Sexual Activity  . Alcohol use: No    Alcohol/week: 0.0 standard drinks  . Drug use: No  . Sexual activity: Never    Birth control/protection: Post-menopausal, Surgical  Lifestyle  . Physical activity    Days per week: 0 days    Minutes per session: 0 min  . Stress: Rather much  Relationships  . Social connections    Talks on phone: More than three times a week    Gets together: More than three times a week    Attends religious service: Never    Active member of club or organization: No    Attends meetings of clubs or organizations: Never    Relationship status: Married  . Intimate partner violence    Fear of current or ex partner: No    Emotionally abused: No    Physically abused: No    Forced sexual activity: No  Other Topics Concern  . Not on file  Social History Narrative   Marital status:Married x 45 yrs. Happily married, no abuse.       Children:  2 children (daughter 30, son 57). Two grandsons and 2 step grandchildren.  1 gg.      Lives: with husband, daughter, son-in-law, 2 grandsons, and 1 on the way.      Employment:  disability for peripheral neuropathy 2012.  Previously had home daycare.      Tobacco: none      Alcohol: none      Drugs: none      Exercise: none. [employer].      Pets: dog.      Always uses seat belts, smoke detectors in home.      No guns in the home.       Caffeine use: 2 cups coffee per day.       Nutrition: Well balanced diet.    FH:  Family History  Problem Relation Age of Onset  . Diabetes Mother   . Hypertension Mother   . Arthritis Mother        knees, lumbar DDD, cervical DDD  . Cancer Father        prostate,skin,lymphoma.  . Cancer Brother 82       bladder cancer; non-smoker  . Diabetes Maternal  Grandmother   . Heart disease Maternal Grandmother   . COPD Maternal Grandmother   . Diabetes Paternal Grandmother   . Obesity Brother   . Diabetes Son   . Hypertension Son   . Cancer Maternal Grandfather   . Diabetes Paternal Grandfather     Past Medical History:  Diagnosis Date  . Acute myocardial infarction, subendocardial infarction, initial episode of care ([place]) 07/21/2012  . Acute osteomyelitis involving ankle and foot ([place]) 02/06/2015  . Acute systolic heart failure ([place]) 07/21/2012   New onset 07/19/12; admission to [facility] ED. Elevated Troponins.  S/p 2D-echo with EF 20-25%.  S/p cardiac catheterization with stenting LAD.  Repeat 2D-echo 10/2011 with improved EF of 35%.   . Anemia   . Automatic implantable cardioverter-defibrillator in situ    a. MDT CRT-D 06/2014, SN: [id number]  .removed in feb 2020  . CAD (coronary artery disease)    a. cardiac cath 101/04/2012: PCI/DES to chronically occluded mLAD, consideration PCI to diag branch in 4 weeks.   . Cataract   . Chicken pox   . Chronic kidney disease   . Chronic systolic CHF (congestive heart failure) (HCC)    a. mixed ICM & NICM; b. EF 20-25% by echo 07/2012, mid-dist 2/3 of LV sev HK/AK, mild MR. echo 10/2012: EF 30-35%, sev HK ant-septal & inf walls, GR1DD, mild MR, PASP 33. c. echo 02/2013: EF 30%, GR1DD, mild MR. echo 04/2014: EF 30%, Septal-lat dyssynchrony, global HK, inf AK, GR1DD, mild MR. d. echo 10/2014: EF50-55%, WM nl, GR1DD, septal mild paradox. e. echo 02/2015: EF 50-55%, wm   . Depression   . Heart attack ([place])   . Heel ulcer ([place]) 04/27/2015  . History of blood transfusion ~ 2011   "plasma; had neuropathy; couldn't walk"  . Hypertension   . LBBB (left bundle branch block)   . Neuromuscular disorder ([place])   . Neuropathy 2011  . Obesity, unspecified   . OSA on CPAP    Moderate with AHI 23/hr and now on CPAP at 16cm H2O.  Her DME is [facility]  . Pure hypercholesterolemia   . Sepsis ([place]) 09/2018  . Type II diabetes  mellitus ([place])   . Unspecified vitamin D deficiency     Current Outpatient Medications  Medication Sig Dispense Refill  . amiodarone (PACERONE) 200 MG tablet Take 200 mg by mouth daily.    . Calcium Carbonate-Vitamin D3 (CALCIUM 600-D) 600-400 MG-UNIT TABS Take 1 tablet by mouth 2 (two) times a day.    . clopidogrel (PLAVIX) 75 MG  tablet Take 1 tablet (75 mg total) by mouth daily. 90 tablet 0  . ferrous sulfate 325 (65 FE) MG tablet Take 325 mg by mouth 2 (two) times daily with a meal.    . gabapentin (NEURONTIN) 600 MG tablet Take 1 tablet (600 mg total) by mouth 2 (two) times daily. Takes 1 in the morning and two at bedtime 180 tablet 1  . glucose blood (ONE TOUCH ULTRA TEST) test strip USE AS DIRECTED TO CHECK BLOOD SUGAR 3 TIMES DAILY 300 each 11  . hydrALAZINE (APRESOLINE) 25 MG tablet Take 1/2 tablet by mouth 3 times a day. 90 tablet 2  . insulin glargine (LANTUS) 100 UNIT/ML injection Inject 0.16 mLs (16 Units total) into the skin at bedtime. 10 mL 11  . isosorbide mononitrate (IMDUR) 30 MG 24 hr tablet Take 1 tablet (30 mg total) by mouth daily. 30 tablet 11  . levothyroxine (SYNTHROID) 25 MCG tablet Take 1 tablet (25 mcg total) by mouth daily before breakfast. 30 tablet 6  . Multiple Vitamin (MULTIVITAMIN) tablet Take 1 tablet by mouth daily.    . Omega-3 Fatty Acids (FISH OIL) 1000 MG CAPS Take 1 capsule by mouth 2 (two) times a day.     . rosuvastatin (CRESTOR) 10 MG tablet Take 1 tablet (10 mg total) by mouth daily. 90 tablet 3  . torsemide (DEMADEX) 20 MG tablet Take 80 mg by mouth daily.    . traMADol (ULTRAM) 50 MG tablet Take 1 tablet (50 mg total) by mouth every 6 (six) hours as needed. (Patient taking differently: Take 50 mg by mouth every 6 (six) hours as needed for moderate pain. ) 120 tablet 5   No current facility-administered medications for this encounter.     Vitals:   07/23/19 1137  BP: 128/62  Pulse: (!) 58  SpO2: 99%  Weight: 110.2 kg (243 lb)   Wt Readings  from Last 3 Encounters:  07/23/19 110.2 kg (243 lb)  07/19/19 107.5 kg (237 lb)  06/29/19 106.1 kg (234 lb)    PHYSICAL EXAM: General:  Well appearing. No resp difficulty. Walked in the clinic! HEENT: normal Neck: supple. no JVD. Carotids 2+ bilat; no bruits. No lymphadenopathy or thryomegaly appreciated. Cor: PMI nondisplaced. Regular rate & rhythm. No rubs, gallops or murmurs. Lungs: clear Abdomen: soft, nontender, nondistended. No hepatosplenomegaly. No bruits or masses. Good bowel sounds. Extremities: no cyanosis, clubbing, rash, edema.  Neuro: alert & orientedx3, cranial nerves grossly intact. moves all 4 extremities w/o difficulty. Affect pleasant     ASSESSMENT & PLAN: 1.  Chronic CHF due to mixed ICM. NICM with LBBB (128ms) - Echo 03/22/19 EF 10-15% (down from 40-45% prior to removal of CRT) - s/p CRT-D replacement 6/25  -NYHA II-III. Volume status stable. Continue current diuretic regimen.  - No bb for now.  - No spiro, dig. Add 12.5 mg losartan daily. Check BMET next week.   - Add 12.5 mg hydralazine three times a day + imdur 30 mg daily.  - Repeat ECHO next week  2. Chronic CKD III Followed by Nephrology - BMET next week after starting losartan.    3. PAT & NSVT, frequent PVCs -Continue amiodarone 200 mg daily.    4. CAD - No chest pain.  - last cath in 12/19 with severe LAD mid to distal LAD disease after previous stent. No PCI options.  - continue statin and DAPT  5. DM2 - Lantus adjusted and now improved - cover with SSI - HgBa1c 6.7  6. H/o PAD - s/p bilateral toe amputations due to non-healing wounds  7. Morbid obesity Body mass index is 36.95 kg/m. Discussed portion control.   8. S/P CRT-D [employer]  Repeat ECHO  Next week.   Follow up 3 months with Dr Haroldine Laws.      NP-C  11:47 AM

## 2019-07-28 DIAGNOSIS — G4733 Obstructive sleep apnea (adult) (pediatric): Secondary | ICD-10-CM | POA: Diagnosis not present

## 2019-07-28 DIAGNOSIS — I5022 Chronic systolic (congestive) heart failure: Secondary | ICD-10-CM | POA: Diagnosis not present

## 2019-07-28 DIAGNOSIS — A492 Hemophilus influenzae infection, unspecified site: Secondary | ICD-10-CM | POA: Diagnosis not present

## 2019-07-28 DIAGNOSIS — J189 Pneumonia, unspecified organism: Secondary | ICD-10-CM | POA: Diagnosis not present

## 2019-07-30 ENCOUNTER — Other Ambulatory Visit: Payer: Self-pay

## 2019-07-30 ENCOUNTER — Ambulatory Visit (INDEPENDENT_AMBULATORY_CARE_PROVIDER_SITE_OTHER): Payer: PPO

## 2019-07-30 DIAGNOSIS — I5022 Chronic systolic (congestive) heart failure: Secondary | ICD-10-CM | POA: Diagnosis not present

## 2019-07-30 DIAGNOSIS — I255 Ischemic cardiomyopathy: Secondary | ICD-10-CM | POA: Diagnosis not present

## 2019-08-02 ENCOUNTER — Telehealth (HOSPITAL_COMMUNITY): Payer: Self-pay

## 2019-08-02 NOTE — Telephone Encounter (Signed)
Today was a telephone appt with [name].  She states she has been doing great.  She denies any chest pain or discomfort, shortness of breath, headaches or dizziness.  She states has all her medications and verified them.  She is aware of how to take them.  She is able to get out and about and do things now.  She states will be a few more month til she moves.  She is watching her diet and fluid intake.  She has no swelling, her weight is down to 242 lbs.  Her daighter has moved out but she has her son living with her.  Will continue to visit for heart failure, diet and medication compliance.   [place] [phone]

## 2019-08-05 DIAGNOSIS — I5022 Chronic systolic (congestive) heart failure: Secondary | ICD-10-CM | POA: Diagnosis not present

## 2019-08-05 DIAGNOSIS — M86171 Other acute osteomyelitis, right ankle and foot: Secondary | ICD-10-CM | POA: Diagnosis not present

## 2019-08-05 DIAGNOSIS — J189 Pneumonia, unspecified organism: Secondary | ICD-10-CM | POA: Diagnosis not present

## 2019-08-05 DIAGNOSIS — G4733 Obstructive sleep apnea (adult) (pediatric): Secondary | ICD-10-CM | POA: Diagnosis not present

## 2019-08-09 ENCOUNTER — Telehealth: Payer: Self-pay | Admitting: Nurse Practitioner

## 2019-08-09 NOTE — Telephone Encounter (Signed)
Spoke with patient to see if we could reschedule her 08/12/19 Palliative f/u visit to 08/19/19, so that NP could work someone else in and she was in agreement with this.  Notified NP.

## 2019-08-12 ENCOUNTER — Other Ambulatory Visit: Payer: PPO | Admitting: Nurse Practitioner

## 2019-08-19 ENCOUNTER — Other Ambulatory Visit: Payer: Self-pay

## 2019-08-19 ENCOUNTER — Other Ambulatory Visit: Payer: PPO | Admitting: Nurse Practitioner

## 2019-08-19 ENCOUNTER — Encounter: Payer: Self-pay | Admitting: Nurse Practitioner

## 2019-08-19 DIAGNOSIS — I509 Heart failure, unspecified: Secondary | ICD-10-CM | POA: Diagnosis not present

## 2019-08-19 DIAGNOSIS — E1142 Type 2 diabetes mellitus with diabetic polyneuropathy: Secondary | ICD-10-CM | POA: Diagnosis not present

## 2019-08-19 DIAGNOSIS — Z515 Encounter for palliative care: Secondary | ICD-10-CM

## 2019-08-19 NOTE — Progress Notes (Signed)
Therapist, nutritional Palliative Care Consult Note Telephone: [phone]  Fax: [phone]  PATIENT NAME: [name] DOB: [birth year] MRN: [id number]  PRIMARY CARE PROVIDER:   Myles Lipps, MD  REFERRING PROVIDER:  Myles Lipps, MD [address] [place],  [place] [address]  RESPONSIBLE PARTY:   Self  Due to the COVID-19 crisis, this visit was done via telemedicine from my office and it was initiated and consent by this patient and or family.  RECOMMENDATIONS and PLAN: 1. ACP: DNR; Treat what is treatable  2.Dyspneic R06.00 secondary to remain stable at present time. Continue daily weights  3. Palliative care encounter Z51.5; Palliative medicine team will continue to support patient, patient's family, and medical team. Visit consisted of counseling and education dealing with the complex and emotionally intense issues of symptom management and palliative care in the setting of serious and potentially life-threatening illness  I spent 35 minutes providing this consultation,  from 9:00am to 9:35am. More than 50% of the time in this consultation was spent coordinating communication.   HISTORY OF PRESENT ILLNESS:  [name] is a 63 y.o. year old female with multiple medical problems including Late onset CVA, Systolic congestive heart failure, coronary artery disease, automatic implantable cardioverter-defibrillator placed, myocardial infarction, left bundle branch block, his shaved endocarditis, obstructive sleep apnea, diabetes, hyperlipidemia, lymphedema, hypertension, obesity, neuropathy, chronic kidney disease, iron deficiency anemia, depression, history of osteomyelitis ankle/foot 2013, cataracts, neuropathy, vaginal hysterectomy with oophorectomy, tubal ligation, TEE without cardioversion, right, left toe amputation, lumbar disc surgery, cholecystectomy. I called [name] for scheduled telemedicine telephonic follow-up palliative care  visit. She was seen by Nephrology 10 / 13 / 2020 Dr Wynelle Link  for follow up for chronic kidney disease stage IV. Vickie. [name] was seen by Tonye Becket, Nurse Practitioner for [facility] 10 / 16 / 2020 for follow-up visit with weights at home ranging from 235 to 237 lb. Recommendation for follow up Echo next week. Added Losartan  daily in addition to Hydralazine and Imdur. Portion control for diet was discussed as BMI 36.95. She does continue on Control and instrumentation engineer. We talked about purpose of palliative care visit and [name] in agreement. We talked about how she has been feeling. She denies chest pain, shortness of breath, edema. She has been feeling excellent. Vickie. [name] continues to go to her camper at a campground where she does get some peace and quiet. We talked about her appetite which is good. We talked about her functional status as she is independent, ambulatory and drives. We talked about her daughter recently moving out with her grandkids as now she is living alone. We talked about chronic disease progression and her recovery. We talked about medical goals of care to treat what is treatable. We talked about role of palliative care and plan of care. Discuss that will follow up in two months if needed or sooner should she declined. Vickie. [name] in agreement. Therapeutic listening and emotional support provided. She does continue to be stable at present time, doing very well. Questions answered to satisfaction. Contact information provided.  Palliative Care was asked to help to continue to address goals of care.   CODE STATUS: DNR  PPS: 60% HOSPICE ELIGIBILITY/DIAGNOSIS: TBD  PAST MEDICAL HISTORY:  Past Medical History:  Diagnosis Date   Acute myocardial infarction, subendocardial infarction, initial episode of care (HCC) 07/21/2012   Acute osteomyelitis involving ankle and foot (HCC) 02/06/2015   Acute systolic heart  failure (HCC) 07/21/2012   New onset  07/19/12; admission to [facility] ED. Elevated Troponins.  S/p 2D-echo with EF 20-25%.  S/p cardiac catheterization with stenting LAD.  Repeat 2D-echo 10/2011 with improved EF of 35%.    Anemia    Automatic implantable cardioverter-defibrillator in situ    a. MDT CRT-D 06/2014, SN: [id number] H  .removed in feb 2020   CAD (coronary artery disease)    a. cardiac cath 101/04/2012: PCI/DES to chronically occluded mLAD, consideration PCI to diag branch in 4 weeks.    Cataract    Chicken pox    Chronic kidney disease    Chronic systolic CHF (congestive heart failure) (HCC)    a. mixed ICM & NICM; b. EF 20-25% by echo 07/2012, mid-dist 2/3 of LV sev HK/AK, mild MR. echo 10/2012: EF 30-35%, sev HK ant-septal & inf walls, GR1DD, mild MR, PASP 33. c. echo 02/2013: EF 30%, GR1DD, mild MR. echo 04/2014: EF 30%, Septal-lat dyssynchrony, global HK, inf AK, GR1DD, mild MR. d. echo 10/2014: EF50-55%, WM nl, GR1DD, septal mild paradox. e. echo 02/2015: EF 50-55%, wm    Depression    Heart attack (HCC)    Heel ulcer (HCC) 04/27/2015   History of blood transfusion ~ 2011   "plasma; had neuropathy; couldn't walk"   Hypertension    LBBB (left bundle branch block)    Neuromuscular disorder (HCC)    Neuropathy 2011   Obesity, unspecified    OSA on CPAP    Moderate with AHI 23/hr and now on CPAP at 16cm H2O.  Her DME is [facility]   Pure hypercholesterolemia    Sepsis (HCC) 09/2018   Type II diabetes mellitus (HCC)    Unspecified vitamin D deficiency     SOCIAL HX:  Social History   Tobacco Use   Smoking status: Never Smoker   Smokeless tobacco: Never Used  Substance Use Topics   Alcohol use: No    Alcohol/week: 0.0 standard drinks    ALLERGIES: No Known Allergies   PERTINENT MEDICATIONS:  Outpatient Encounter Medications as of 08/19/2019  Medication Sig   amiodarone (PACERONE) 200 MG tablet Take 200 mg by mouth daily.   Calcium Carbonate-Vitamin D3 (CALCIUM 600-D) 600-400 MG-UNIT TABS  Take 1 tablet by mouth 2 (two) times a day.   clopidogrel (PLAVIX) 75 MG tablet Take 1 tablet (75 mg total) by mouth daily.   ferrous sulfate 325 (65 FE) MG tablet Take 325 mg by mouth 2 (two) times daily with a meal.   gabapentin (NEURONTIN) 600 MG tablet Take 1 tablet (600 mg total) by mouth 2 (two) times daily. Takes 1 in the morning and two at bedtime   glucose blood (ONE TOUCH ULTRA TEST) test strip USE AS DIRECTED TO CHECK BLOOD SUGAR 3 TIMES DAILY   hydrALAZINE (APRESOLINE) 25 MG tablet Take 1/2 tablet by mouth 3 times a day.   insulin glargine (LANTUS) 100 UNIT/ML injection Inject 0.16 mLs (16 Units total) into the skin at bedtime.   isosorbide mononitrate (IMDUR) 30 MG 24 hr tablet Take 1 tablet (30 mg total) by mouth daily.   levothyroxine (SYNTHROID) 25 MCG tablet Take 1 tablet (25 mcg total) by mouth daily before breakfast.   losartan (COZAAR) 25 MG tablet Take 0.5 tablets (12.5 mg total) by mouth daily.   Multiple Vitamin (MULTIVITAMIN) tablet Take 1 tablet by mouth daily.   Omega-3 Fatty Acids (FISH OIL) 1000 MG CAPS Take 1 capsule by mouth 2 (two) times a day.  rosuvastatin (CRESTOR) 10 MG tablet Take 1 tablet (10 mg total) by mouth daily.   torsemide (DEMADEX) 20 MG tablet Take 80 mg by mouth daily.   traMADol (ULTRAM) 50 MG tablet Take 1 tablet (50 mg total) by mouth every 6 (six) hours as needed. (Patient taking differently: Take 50 mg by mouth every 6 (six) hours as needed for moderate pain. )   No facility-administered encounter medications on file as of 08/19/2019.     PHYSICAL EXAM:  Deferred  Dafina Suk Z Zaelyn Noack, NP

## 2019-08-24 ENCOUNTER — Telehealth (HOSPITAL_COMMUNITY): Payer: Self-pay

## 2019-08-24 NOTE — Telephone Encounter (Signed)
Attempted to contact, left message.  ° °Nicholus Chandran °[place] EMT-Paramedic °[phone] °

## 2019-08-25 ENCOUNTER — Telehealth: Payer: Self-pay | Admitting: Family Medicine

## 2019-08-25 ENCOUNTER — Telehealth (HOSPITAL_COMMUNITY): Payer: Self-pay

## 2019-08-25 NOTE — Telephone Encounter (Signed)
Today had a telephone appt with [name].  She states been doing good.  She cheated some with Halloween candy and a birthday and she is getting her sugar back under control.  She states was elevated but back down to 160's now.  She states she has no swelling, todays weight 222 lbs.   She has had no weight gain.  She is still watching her sodium enriched foods and watching how much fluids she is drinking.  She has all her medications and meds verified.  She is working with her Dr getting refills for gabapentin and tramadol.  She ran out today.  She is staying very active remodeling a trailer that she will be moving into.  Denies any chest pain, shortness of breath, headaches or dizziness.  Advised her to watch her foods during the holidays coming up, very hard to keep on track.  She appears to understand.  She is aware of up coming appts.  Will continue to visit for heart failure.   [place] [phone]

## 2019-08-25 NOTE — Telephone Encounter (Signed)
Medication refill: gabapentin (NEURONTIN) 600 MG tablet [[id number]]  traMADol (ULTRAM) 50 MG tablet [[id number]]   Pharmacy:  [place], [place] [phone] (Phone) [phone] (Fax)   Pt is completely out of medication. Also aware of turn around time

## 2019-08-27 ENCOUNTER — Other Ambulatory Visit: Payer: Self-pay | Admitting: Family Medicine

## 2019-08-27 MED ORDER — GABAPENTIN 600 MG PO TABS: 600.0000 mg | ORAL_TABLET | Freq: Two times a day (BID) | ORAL | 1 refills | Status: DC

## 2019-08-27 MED ORDER — TRAMADOL HCL 50 MG PO TABS: 50.0000 mg | ORAL_TABLET | Freq: Four times a day (QID) | ORAL | 0 refills | Status: DC | PRN

## 2019-08-27 NOTE — Progress Notes (Signed)
pmp reviewed Med refilled Needs in office visit

## 2019-08-27 NOTE — Telephone Encounter (Signed)
Patient called to say that its been 4 days since she have had her pain medications. She also states that her blood sugar is out of control. Was 600 last night and 245 in the morning when she woke up she also said on the night of 08/26/2019 she took 28 units of Lantus. Please advise Ph#  [phone]

## 2019-08-27 NOTE — Telephone Encounter (Signed)
FYI,   would you like me to schedule and OV for this pt?

## 2019-08-28 DIAGNOSIS — J189 Pneumonia, unspecified organism: Secondary | ICD-10-CM | POA: Diagnosis not present

## 2019-08-28 DIAGNOSIS — A492 Hemophilus influenzae infection, unspecified site: Secondary | ICD-10-CM | POA: Diagnosis not present

## 2019-08-28 DIAGNOSIS — I5022 Chronic systolic (congestive) heart failure: Secondary | ICD-10-CM | POA: Diagnosis not present

## 2019-08-28 DIAGNOSIS — G4733 Obstructive sleep apnea (adult) (pediatric): Secondary | ICD-10-CM | POA: Diagnosis not present

## 2019-09-05 DIAGNOSIS — M86171 Other acute osteomyelitis, right ankle and foot: Secondary | ICD-10-CM | POA: Diagnosis not present

## 2019-09-05 DIAGNOSIS — G4733 Obstructive sleep apnea (adult) (pediatric): Secondary | ICD-10-CM | POA: Diagnosis not present

## 2019-09-05 DIAGNOSIS — J189 Pneumonia, unspecified organism: Secondary | ICD-10-CM | POA: Diagnosis not present

## 2019-09-05 DIAGNOSIS — I5022 Chronic systolic (congestive) heart failure: Secondary | ICD-10-CM | POA: Diagnosis not present

## 2019-09-06 ENCOUNTER — Encounter: Payer: Self-pay | Admitting: Family Medicine

## 2019-09-06 ENCOUNTER — Telehealth: Payer: Self-pay | Admitting: Family Medicine

## 2019-09-06 ENCOUNTER — Ambulatory Visit (INDEPENDENT_AMBULATORY_CARE_PROVIDER_SITE_OTHER): Payer: PPO | Admitting: Family Medicine

## 2019-09-06 ENCOUNTER — Other Ambulatory Visit: Payer: Self-pay

## 2019-09-06 VITALS — BP 119/56 | HR 65 | Temp 98.0°F | Ht 68.0 in | Wt 230.8 lb

## 2019-09-06 DIAGNOSIS — E1129 Type 2 diabetes mellitus with other diabetic kidney complication: Secondary | ICD-10-CM

## 2019-09-06 DIAGNOSIS — I5022 Chronic systolic (congestive) heart failure: Secondary | ICD-10-CM | POA: Diagnosis not present

## 2019-09-06 DIAGNOSIS — D508 Other iron deficiency anemias: Secondary | ICD-10-CM | POA: Diagnosis not present

## 2019-09-06 DIAGNOSIS — E1165 Type 2 diabetes mellitus with hyperglycemia: Secondary | ICD-10-CM | POA: Diagnosis not present

## 2019-09-06 DIAGNOSIS — E7800 Pure hypercholesterolemia, unspecified: Secondary | ICD-10-CM | POA: Diagnosis not present

## 2019-09-06 DIAGNOSIS — I10 Essential (primary) hypertension: Secondary | ICD-10-CM

## 2019-09-06 DIAGNOSIS — IMO0002 Reserved for concepts with insufficient information to code with codable children: Secondary | ICD-10-CM

## 2019-09-06 MED ORDER — INSULIN LISPRO (1 UNIT DIAL) 100 UNIT/ML (KWIKPEN): 5.0000 [IU] | PEN_INJECTOR | Freq: Three times a day (TID) | SUBCUTANEOUS | 11 refills | Status: DC

## 2019-09-06 MED ORDER — PEN NEEDLES 32G X 6 MM MISC: 1.0000 | Freq: Four times a day (QID) | 11 refills | Status: DC

## 2019-09-06 MED ORDER — INSULIN GLARGINE 100 UNIT/ML ~~LOC~~ SOLN: 22.0000 [IU] | Freq: Every day | SUBCUTANEOUS | 11 refills | Status: DC

## 2019-09-06 MED ORDER — INSULIN LISPRO 100 UNIT/ML ~~LOC~~ SOLN: 5.0000 [IU] | Freq: Three times a day (TID) | SUBCUTANEOUS | 2 refills | Status: DC

## 2019-09-06 MED ORDER — LANTUS SOLOSTAR 100 UNIT/ML ~~LOC~~ SOPN: 22.0000 [IU] | PEN_INJECTOR | Freq: Every day | SUBCUTANEOUS | 11 refills | Status: DC

## 2019-09-06 NOTE — Telephone Encounter (Signed)
I have spoken to pt and also called the pharmacy we have switched her medication to pens instead of vials via verbal and informed the pharmacy that we have sent her new latus in as well. Both pt and pharmacy tech stated understanding.

## 2019-09-06 NOTE — Telephone Encounter (Signed)
Lovena Le from the pharmacy calling.  States they received a RX from Dr. Pamella Pert for Humalog vial.  Pt states that she has been taking the Lantus pen and that is what she wants. Pharmacy will need new script sent in for this.

## 2019-09-06 NOTE — Patient Instructions (Signed)
° ° ° °  If you have lab work done today you will be contacted with your lab results within the next 2 weeks.  If you have not heard from us then please contact us. The fastest way to get your results is to register for My Chart. ° ° °IF you received an x-ray today, you will receive an invoice from [facility]. Please contact [facility] at [phone] with questions or concerns regarding your invoice.  ° °IF you received labwork today, you will receive an invoice from [facility]. Please contact [facility] at [phone] with questions or concerns regarding your invoice.  ° °Our billing staff will not be able to assist you with questions regarding bills from these companies. ° °You will be contacted with the lab results as soon as they are available. The fastest way to get your results is to activate your My Chart account. Instructions are located on the last page of this paperwork. If you have not heard from us regarding the results in 2 weeks, please contact this office. °  ° ° ° °

## 2019-09-06 NOTE — Progress Notes (Signed)
11/30/20203:13 PM  Coral Timme Nylen 28-Dec-1955, 63 y.o., female [id number]  Chief Complaint  Patient presents with  . Diabetes    f/u     HPI:   Patient is a 63 y.o. 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,MSSA bacteremia2/2 osteomyelitis s/p amputation,systolicCHF, OSA on cpap, DM2 on insulin, CKD, HTN, HLP, anemia here for routine followup  She finally got another pacemaker, has been followed very closely by cards/CHF clinic, doing sign well She is worried about her cbgs cbgs have been rising, mostly during the day lantus 22 units, fasting this morning 120 Daytime 200-300s Used to be on bolus insulin prior Her cbgs were doing well until recent dietary indiscretions Having increase family stressors Has seen podiatry Has to schedule eye doctor  Lab Results  Component Value Date   HGBA1C 6.7 (H) 03/23/2019   HGBA1C 5.6 02/03/2019   HGBA1C 7.8 (H) 10/22/2018   Lab Results  Component Value Date   MICROALBUR 6.9 08/06/2016   [place] 25 02/03/2019   CREATININE 2.02 (H) 06/08/2019    Depression screen PHQ 2/9 09/06/2019 04/16/2019 03/22/2019  Decreased Interest 0 0 0  Down, Depressed, Hopeless 0 0 0  PHQ - 2 Score 0 0 0  Altered sleeping - - -  Tired, decreased energy - - -  Change in appetite - - -  Feeling bad or failure about yourself  - - -  Trouble concentrating - - -  Moving slowly or fidgety/restless - - -  Suicidal thoughts - - -  PHQ-9 Score - - -  Difficult doing work/chores - - -  Some recent data might be hidden    Fall Risk  09/06/2019 04/16/2019 03/22/2019 02/03/2019 12/24/2018  Falls in the past year? 0 0 0 0 0  Number falls in past yr: 0 0 0 - 0  Injury with Fall? 0 0 0 - 0  Comment - - - - -  Risk for fall due to : - - - - -  Risk for fall due to: Comment - - - - -  Follow up Falls evaluation completed - - - -     No Known Allergies  Prior to Admission medications    Medication Sig Start Date End Date Taking? Authorizing Provider  amiodarone (PACERONE) 200 MG tablet Take 200 mg by mouth daily.   Yes [provider]  Calcium Carbonate-Vitamin D3 (CALCIUM 600-D) 600-400 MG-UNIT TABS Take 1 tablet by mouth 2 (two) times a day.   Yes [provider]  clopidogrel (PLAVIX) 75 MG tablet Take 1 tablet (75 mg total) by mouth daily. 07/20/19  Yes Rutherford Guys, MD  ferrous sulfate 325 (65 FE) MG tablet Take 325 mg by mouth 2 (two) times daily with a meal.   Yes [provider]  gabapentin (NEURONTIN) 600 MG tablet Take 1 tablet (600 mg total) by mouth 2 (two) times daily. Takes 1 in the morning and two at bedtime 08/27/19  Yes Rutherford Guys, MD  glucose blood (ONE TOUCH ULTRA TEST) test strip USE AS DIRECTED TO CHECK BLOOD SUGAR 3 TIMES DAILY 01/09/19  Yes Rutherford Guys, MD  hydrALAZINE (APRESOLINE) 25 MG tablet Take 1/2 tablet by mouth 3 times a day. 05/10/19  Yes Clegg, Amy D, NP  insulin glargine (LANTUS) 100 UNIT/ML injection Inject 0.16 mLs (16 Units total) into the skin at bedtime. 04/16/19  Yes Rutherford Guys, MD  isosorbide mononitrate (IMDUR) 30  MG 24 hr tablet Take 1 tablet (30 mg total) by mouth daily. 05/10/19 05/09/20 Yes Clegg, Amy D, NP  levothyroxine (SYNTHROID) 25 MCG tablet Take 1 tablet (25 mcg total) by mouth daily before breakfast. 05/18/19  Yes Deboraha Sprang, MD  losartan (COZAAR) 25 MG tablet Take 0.5 tablets (12.5 mg total) by mouth daily. 07/23/19 10/21/19 Yes Clegg, Amy D, NP  Multiple Vitamin (MULTIVITAMIN) tablet Take 1 tablet by mouth daily.   Yes [provider]  Omega-3 Fatty Acids (FISH OIL) 1000 MG CAPS Take 1 capsule by mouth 2 (two) times a day.    Yes [provider]  rosuvastatin (CRESTOR) 10 MG tablet Take 1 tablet (10 mg total) by mouth daily. 04/16/19  Yes Rutherford Guys, MD  torsemide (DEMADEX) 20 MG tablet Take 80 mg by mouth daily.   Yes [provider]  traMADol (ULTRAM)  50 MG tablet Take 1 tablet (50 mg total) by mouth every 6 (six) hours as needed. 08/27/19  Yes Rutherford Guys, MD    Past Medical History:  Diagnosis Date  . Acute myocardial infarction, subendocardial infarction, initial episode of care ([place]) 07/21/2012  . Acute osteomyelitis involving ankle and foot ([place]) 02/06/2015  . Acute systolic heart failure ([place]) 07/21/2012   New onset 07/19/12; admission to [facility] ED. Elevated Troponins.  S/p 2D-echo with EF 20-25%.  S/p cardiac catheterization with stenting LAD.  Repeat 2D-echo 10/2011 with improved EF of 35%.   . Anemia   . Automatic implantable cardioverter-defibrillator in situ    a. MDT CRT-D 06/2014, SN: [id number] H  .removed in feb 2020  . CAD (coronary artery disease)    a. cardiac cath 101/04/2012: PCI/DES to chronically occluded mLAD, consideration PCI to diag branch in 4 weeks.   . Cataract   . Chicken pox   . Chronic kidney disease   . Chronic systolic CHF (congestive heart failure) (HCC)    a. mixed ICM & NICM; b. EF 20-25% by echo 07/2012, mid-dist 2/3 of LV sev HK/AK, mild MR. echo 10/2012: EF 30-35%, sev HK ant-septal & inf walls, GR1DD, mild MR, PASP 33. c. echo 02/2013: EF 30%, GR1DD, mild MR. echo 04/2014: EF 30%, Septal-lat dyssynchrony, global HK, inf AK, GR1DD, mild MR. d. echo 10/2014: EF50-55%, WM nl, GR1DD, septal mild paradox. e. echo 02/2015: EF 50-55%, wm   . Depression   . Heart attack ([place])   . Heel ulcer ([place]) 04/27/2015  . History of blood transfusion ~ 2011   "plasma; had neuropathy; couldn't walk"  . Hypertension   . LBBB (left bundle branch block)   . Neuromuscular disorder ([place])   . Neuropathy 2011  . Obesity, unspecified   . OSA on CPAP    Moderate with AHI 23/hr and now on CPAP at 16cm H2O.  Her DME is [facility]  . Pure hypercholesterolemia   . Sepsis ([place]) 09/2018  . Type II diabetes mellitus ([place])   . Unspecified vitamin D deficiency     Past Surgical History:  Procedure Laterality Date  . AMPUTATION TOE  Right 06/18/2015   Procedure: AMPUTATION TOE;  Surgeon: Samara Deist, DPM;  Location: [facility] ORS;  Service: Podiatry;  Laterality: Right;  . AMPUTATION TOE Left 10/08/2018   Procedure: AMPUTATION TOE LEFT 2ND;  Surgeon: Albertine Patricia, DPM;  Location: [facility] ORS;  Service: Podiatry;  Laterality: Left;  . BACK SURGERY    . BI-VENTRICULAR IMPLANTABLE CARDIOVERTER DEFIBRILLATOR N/A 07/06/2014   Procedure: BI-VENTRICULAR IMPLANTABLE CARDIOVERTER DEFIBRILLATOR  (CRT-D);  Surgeon:  Deboraha Sprang, MD;  Location: [facility] CATH LAB;  Service: Cardiovascular;  Laterality: N/A;  . BI-VENTRICULAR IMPLANTABLE CARDIOVERTER DEFIBRILLATOR  (CRT-D)  07/06/2014  . BILATERAL OOPHORECTOMY  01/2011   ovarian cyst benign  . BIV ICD INSERTION CRT-D N/A 03/31/2019   Procedure: BIV ICD INSERTION CRT-D;  Surgeon: Constance Haw, MD;  Location: [place] CV LAB;  Service: Cardiovascular;  Laterality: N/A;  . COLONOSCOPY WITH PROPOFOL Left 02/22/2015   Procedure: COLONOSCOPY WITH PROPOFOL;  Surgeon: Hulen Luster, MD;  Location: [facility] ENDOSCOPY;  Service: Endoscopy;  Laterality: Left;  . CORONARY ANGIOPLASTY WITH STENT PLACEMENT Left 07/2012   new onset systolic CHF; elevated troponins.  Cardiac catheterization with stenting to LAD; EF 15%.  2D-echo: EF 20-25%.  . ESOPHAGOGASTRODUODENOSCOPY N/A 02/22/2015   Procedure: ESOPHAGOGASTRODUODENOSCOPY (EGD);  Surgeon: Hulen Luster, MD;  Location[facility] ENDOSCOPY;  Service: Endoscopy;  Laterality: N/A;  . INCISION AND DRAINAGE ABSCESS Right 2007   groin; with ICU stay due to sepsis.  Marland Kitchen LAPAROSCOPIC CHOLECYSTECTOMY  2011  . LEFT HEART CATH AND CORONARY ANGIOGRAPHY N/A 10/05/2018   Procedure: LEFT HEART CATH AND CORONARY ANGIOGRAPHY;  Surgeon: Minna Merritts, MD;  Location: [place] CV LAB;  Service: Cardiovascular;  Laterality: N/A;  . LEFT HEART CATHETERIZATION WITH CORONARY ANGIOGRAM N/A 07/21/2012   Procedure: LEFT HEART CATHETERIZATION WITH CORONARY ANGIOGRAM;  Surgeon: Jolaine Artist, MD;  Location: [facility] CATH LAB;  Service: Cardiovascular;  Laterality: N/A;  . [place]   L4-5  . PACEMAKER REMOVAL  11/2018   due to infection around pacemaker  . PERCUTANEOUS CORONARY STENT INTERVENTION (PCI-S) N/A 07/23/2012   Procedure: PERCUTANEOUS CORONARY STENT INTERVENTION (PCI-S);  Surgeon: Sherren Mocha, MD;  Location: [facility] CATH LAB;  Service: Cardiovascular;  Laterality: N/A;  . PERIPHERAL VASCULAR BALLOON ANGIOPLASTY Left 10/06/2018   Procedure: PERIPHERAL VASCULAR BALLOON ANGIOPLASTY;  Surgeon: Katha Cabal, MD;  Location: [place] CV LAB;  Service: Cardiovascular;  Laterality: Left;  . PERIPHERAL VASCULAR CATHETERIZATION N/A 02/10/2015   Procedure: Picc Line Insertion;  Surgeon: Katha Cabal, MD;  Location: [place] CV LAB;  Service: Cardiovascular;  Laterality: N/A;  . RIGHT HEART CATH N/A 03/29/2019   Procedure: RIGHT HEART CATH;  Surgeon: Jolaine Artist, MD;  Location: [place] CV LAB;  Service: Cardiovascular;  Laterality: N/A;  . TEE WITHOUT CARDIOVERSION N/A 10/02/2018   Procedure: TRANSESOPHAGEAL ECHOCARDIOGRAM (TEE);  Surgeon: Wellington Hampshire, MD;  Location: [facility] ORS;  Service: Cardiovascular;  Laterality: N/A;  . [place]  . VAGINAL HYSTERECTOMY  01/2011   Fibroids/DUB.  Ovaries removed. [facility].    Social History   Tobacco Use  . Smoking status: Never Smoker  . Smokeless tobacco: Never Used  Substance Use Topics  . Alcohol use: No    Alcohol/week: 0.0 standard drinks    Family History  Problem Relation Age of Onset  . Diabetes Mother   . Hypertension Mother   . Arthritis Mother        knees, lumbar DDD, cervical DDD  . Cancer Father        prostate,skin,lymphoma.  . Cancer Brother 64       bladder cancer; non-smoker  . Diabetes Maternal Grandmother   . Heart disease Maternal Grandmother   . COPD Maternal Grandmother   . Diabetes Paternal Grandmother   . Obesity Brother   . Diabetes Son    . Hypertension Son   . Cancer Maternal Grandfather   . Diabetes  Paternal Grandfather     Review of Systems  Constitutional: Negative for chills and fever.  Respiratory: Negative for cough and shortness of breath.   Cardiovascular: Negative for chest pain, palpitations, orthopnea, leg swelling and PND.  Gastrointestinal: Negative for abdominal pain, nausea and vomiting.     OBJECTIVE:  Today's Vitals   09/06/19 1500  BP: (!) 119/56  Pulse: 65  Temp: 98 F (36.7 C)  TempSrc: Temporal  SpO2: 99%  Weight: 230 lb 12.8 oz (104.7 kg)  Height: 5\' 8"  (1.727 m)   Body mass index is 35.09 kg/m.   Physical Exam Vitals signs and nursing note reviewed.  Constitutional:      Appearance: She is well-developed.  HENT:     Head: Normocephalic and atraumatic.     Mouth/Throat:     Pharynx: No oropharyngeal exudate.  Eyes:     General: No scleral icterus.    Conjunctiva/sclera: Conjunctivae normal.     Pupils: Pupils are equal, round, and reactive to light.  Neck:     Musculoskeletal: Neck supple.  Cardiovascular:     Rate and Rhythm: Normal rate and regular rhythm.     Heart sounds: Normal heart sounds. No murmur. No friction rub. No gallop.   Pulmonary:     Effort: Pulmonary effort is normal.     Breath sounds: Normal breath sounds. No wheezing, rhonchi or rales.  Musculoskeletal:     Right lower leg: No edema.     Left lower leg: No edema.  Skin:    General: Skin is warm and dry.  Neurological:     Mental Status: She is alert and oriented to person, place, and time.     No results found for this or any previous visit (from the past 24 hour(s)).  No results found.   ASSESSMENT and PLAN  1. DM type 2, uncontrolled, with renal complications (HCC) Adding bolus insulin. Discussed LFM.  - Comprehensive metabolic panel - TSH - CBC - Hemoglobin A1c  2. Pure hypercholesterolemia Checking labs today, medications will be adjusted as needed.  - Lipid panel  3.  Essential hypertension, benign Controlled. Continue current regime.   4. Other iron deficiency anemia Checking labs today, medications will be adjusted as needed.   5. Chronic systolic congestive heart failure (HCC) No signs of fluid overload. Managed by cards  Other orders - insulin lispro (HUMALOG KWIKPEN) 100 UNIT/ML KwikPen; Inject 0.05 mLs (5 Units total) into the skin 3 (three) times daily with meals. - Insulin Glargine (LANTUS SOLOSTAR) 100 UNIT/ML Solostar Pen; Inject 22 Units into the skin daily. - Insulin Pen Needle (PEN NEEDLES) 32G X 6 MM MISC; 1 each by Does not apply route 4 (four) times daily.  Return in about 3 months (around 12/05/2019).    Rutherford Guys, MD Primary Care at [place], [place] [address] Ph.  [phone] Fax [phone]

## 2019-09-07 ENCOUNTER — Other Ambulatory Visit: Payer: Self-pay | Admitting: Family Medicine

## 2019-09-07 DIAGNOSIS — E039 Hypothyroidism, unspecified: Secondary | ICD-10-CM

## 2019-09-07 LAB — COMPREHENSIVE METABOLIC PANEL
ALT: 23 IU/L (ref 0–32)
AST: 32 IU/L (ref 0–40)
Albumin/Globulin Ratio: 1.3 (ref 1.2–2.2)
Albumin: 4.3 g/dL (ref 3.8–4.8)
Alkaline Phosphatase: 107 IU/L (ref 39–117)
BUN/Creatinine Ratio: 28 (ref 12–28)
BUN: 62 mg/dL — ABNORMAL HIGH (ref 8–27)
Bilirubin Total: 0.3 mg/dL (ref 0.0–1.2)
CO2: 27 mmol/L (ref 20–29)
Calcium: 9.7 mg/dL (ref 8.7–10.3)
Chloride: 95 mmol/L — ABNORMAL LOW (ref 96–106)
Creatinine, Ser: 2.21 mg/dL — ABNORMAL HIGH (ref 0.57–1.00)
GFR calc Af Amer: 26 mL/min/{1.73_m2} — ABNORMAL LOW (ref >59)
GFR calc non Af Amer: 23 mL/min/{1.73_m2} — ABNORMAL LOW (ref >59)
Globulin, Total: 3.2 g/dL (ref 1.5–4.5)
Glucose: 191 mg/dL — ABNORMAL HIGH (ref 65–99)
Potassium: 4.7 mmol/L (ref 3.5–5.2)
Sodium: 140 mmol/L (ref 134–144)
Total Protein: 7.5 g/dL (ref 6.0–8.5)

## 2019-09-07 LAB — CBC
Hematocrit: 33.6 % — ABNORMAL LOW (ref 34.0–46.6)
Hemoglobin: 11.2 g/dL (ref 11.1–15.9)
MCH: 29 pg (ref 26.6–33.0)
MCHC: 33.3 g/dL (ref 31.5–35.7)
MCV: 87 fL (ref 79–97)
Platelets: 196 10*3/uL (ref 150–450)
RBC: 3.86 x10E6/uL (ref 3.77–5.28)
RDW: 14.2 % (ref 11.7–15.4)
WBC: 6.4 10*3/uL (ref 3.4–10.8)

## 2019-09-07 LAB — LIPID PANEL
Chol/HDL Ratio: 2.9 ratio (ref 0.0–4.4)
Cholesterol, Total: 123 mg/dL (ref 100–199)
HDL: 42 mg/dL (ref >39)
LDL Chol Calc (NIH): 55 mg/dL (ref 0–99)
Triglycerides: 149 mg/dL (ref 0–149)
VLDL Cholesterol Cal: 26 mg/dL (ref 5–40)

## 2019-09-07 LAB — HEMOGLOBIN A1C
Est. average glucose Bld gHb Est-mCnc: 186 mg/dL
Hgb A1c MFr Bld: 8.1 % — ABNORMAL HIGH (ref 4.8–5.6)

## 2019-09-07 LAB — TSH: TSH: 6.17 u[IU]/mL — ABNORMAL HIGH (ref 0.450–4.500)

## 2019-09-07 MED ORDER — LEVOTHYROXINE SODIUM 50 MCG PO TABS: 50.0000 ug | ORAL_TABLET | Freq: Every day | ORAL | 3 refills | Status: DC

## 2019-09-07 NOTE — Telephone Encounter (Signed)
Copied from [place] [id number]. Topic: General - Other >> Sep 07, 2019 10:50 AM Yvette Rack wrote: Reason for CRM: Gerald Stabs with [facility] stated pt insurance does not cover the insulin lispro (HUMALOG KWIKPEN) 100 UNIT/ML KwikPen however it does cover the Novolog FlexPen so they will fill Rx for Novolog FlexPen. Gerald Stabs stated if there are any questions or concerns to please call him. Cb# [phone]

## 2019-09-08 NOTE — Telephone Encounter (Signed)
Please advise and change med if that is okay.

## 2019-09-09 MED ORDER — INSULIN ASPART 100 UNIT/ML FLEXPEN: 5.0000 [IU] | PEN_INJECTOR | Freq: Three times a day (TID) | SUBCUTANEOUS | 11 refills | Status: DC

## 2019-09-10 ENCOUNTER — Ambulatory Visit (INDEPENDENT_AMBULATORY_CARE_PROVIDER_SITE_OTHER): Payer: PPO

## 2019-09-10 ENCOUNTER — Other Ambulatory Visit: Payer: PPO

## 2019-09-10 ENCOUNTER — Other Ambulatory Visit: Payer: Self-pay

## 2019-09-10 DIAGNOSIS — I5042 Chronic combined systolic (congestive) and diastolic (congestive) heart failure: Secondary | ICD-10-CM | POA: Diagnosis not present

## 2019-09-10 DIAGNOSIS — I255 Ischemic cardiomyopathy: Secondary | ICD-10-CM

## 2019-09-14 ENCOUNTER — Other Ambulatory Visit: Payer: Self-pay

## 2019-09-14 ENCOUNTER — Encounter: Payer: Self-pay | Admitting: Internal Medicine

## 2019-09-14 ENCOUNTER — Ambulatory Visit (INDEPENDENT_AMBULATORY_CARE_PROVIDER_SITE_OTHER): Payer: PPO | Admitting: Internal Medicine

## 2019-09-14 VITALS — BP 118/54 | HR 67 | Ht 68.0 in | Wt 238.2 lb

## 2019-09-14 DIAGNOSIS — I493 Ventricular premature depolarization: Secondary | ICD-10-CM | POA: Diagnosis not present

## 2019-09-14 DIAGNOSIS — I5022 Chronic systolic (congestive) heart failure: Secondary | ICD-10-CM

## 2019-09-14 DIAGNOSIS — Z95810 Presence of automatic (implantable) cardiac defibrillator: Secondary | ICD-10-CM | POA: Diagnosis not present

## 2019-09-14 DIAGNOSIS — Z79899 Other long term (current) drug therapy: Secondary | ICD-10-CM

## 2019-09-14 DIAGNOSIS — I428 Other cardiomyopathies: Secondary | ICD-10-CM | POA: Diagnosis not present

## 2019-09-14 LAB — CUP PACEART INCLINIC DEVICE CHECK
Battery Remaining Longevity: 90 mo
Battery Voltage: 3.01 V
Brady Statistic AP VP Percent: 1.68 %
Brady Statistic AP VS Percent: 0.02 %
Brady Statistic AS VP Percent: 98.12 %
Brady Statistic AS VS Percent: 0.18 %
Brady Statistic RA Percent Paced: 1.7 %
Brady Statistic RV Percent Paced: 98.9 %
Date Time Interrogation Session: 20201208122804
HighPow Impedance: 50 Ohm
Implantable Lead Implant Date: 20200625
Implantable Lead Implant Date: 20200625
Implantable Lead Implant Date: 20200625
Implantable Lead Location: 753858
Implantable Lead Location: 753859
Implantable Lead Location: 753860
Implantable Lead Model: 4398
Implantable Lead Model: 5076
Implantable Lead Model: 6935
Implantable Pulse Generator Implant Date: 20200625
Lead Channel Impedance Value: 169.459
Lead Channel Impedance Value: 178.125
Lead Channel Impedance Value: 187.841
Lead Channel Impedance Value: 222.34 Ohm
Lead Channel Impedance Value: 255.093
Lead Channel Impedance Value: 285 Ohm
Lead Channel Impedance Value: 342 Ohm
Lead Channel Impedance Value: 399 Ohm
Lead Channel Impedance Value: 399 Ohm
Lead Channel Impedance Value: 418 Ohm
Lead Channel Impedance Value: 475 Ohm
Lead Channel Impedance Value: 551 Ohm
Lead Channel Impedance Value: 646 Ohm
Lead Channel Impedance Value: 665 Ohm
Lead Channel Impedance Value: 703 Ohm
Lead Channel Impedance Value: 779 Ohm
Lead Channel Impedance Value: 817 Ohm
Lead Channel Impedance Value: 931 Ohm
Lead Channel Pacing Threshold Amplitude: 0.75 V
Lead Channel Pacing Threshold Amplitude: 0.75 V
Lead Channel Pacing Threshold Amplitude: 1 V
Lead Channel Pacing Threshold Pulse Width: 0.4 ms
Lead Channel Pacing Threshold Pulse Width: 0.4 ms
Lead Channel Pacing Threshold Pulse Width: 1 ms
Lead Channel Sensing Intrinsic Amplitude: 1.125 mV
Lead Channel Sensing Intrinsic Amplitude: 15.875 mV
Lead Channel Setting Pacing Amplitude: 2 V
Lead Channel Setting Pacing Amplitude: 2 V
Lead Channel Setting Pacing Amplitude: 2 V
Lead Channel Setting Pacing Pulse Width: 0.4 ms
Lead Channel Setting Pacing Pulse Width: 1 ms
Lead Channel Setting Sensing Sensitivity: 0.3 mV

## 2019-09-14 MED ORDER — CARVEDILOL 3.125 MG PO TABS: 3.1250 mg | ORAL_TABLET | Freq: Two times a day (BID) | ORAL | 3 refills | Status: DC

## 2019-09-14 NOTE — Progress Notes (Signed)
Patient Care Team: Rutherford Guys, MD as PCP - General (Family Medicine) Minna Merritts, MD as PCP - Cardiology (Cardiology) Deboraha Sprang, MD as PCP - Electrophysiology (Cardiology) Bensimhon, Shaune Pascal, MD as PCP - Advanced Heart Failure (Cardiology) Calvert Cantor, MD as Consulting Physician (Ophthalmology) Lavonia Dana, MD as Consulting Physician (Internal Medicine) Elvina Mattes, Adele Schilder as Attending Physician (Podiatry) Tsosie Billing, MD as Consulting Physician (Infectious Diseases)   HPI  [name] is a 63 y.o. female Seen in follow-up for pocket hematoma related to a CRT-D re implantation 03/31/2019 [facility]) .  She had undergone extraction 3/20 2/2 recurrent MSSA bacteremia at [facility]  Nonischemic cardiomyopathy and ventricular ectopy.  Patient had initial near normalization of LV systolic function following CRT with significant loss of LV function with extraction, although EM reminded me that her husband dies intercurrently as well (? [place])       DATE TEST EF   9/15 echo 30 %   1 /17 echo 50 %   12/19 Echo 45-50%   12/19 TEE  No vegetations  12/19 LHC  pLAD80 with ISR; mLAD70 pCx 80;D1 90 mRCA 40  6/20 Echo  10-15%   12/20 Echo 35%          Date Cr K Hgb TSH LFTs  3./20 3.54>>1.18  11 4.64  18  7/20 2.15 4.9 9.7 (6/20)17.4    18  9/20  2.02 4.3  21.4   11/20 2.17 4.7 11.2 6.17 23   No walking and able to do her daily chores.  Much better than prior to her CRT reimplantation.  No orthopnea.  Chronic left greater than right peripheral edema.  Records and Results Reviewed   Past Medical History:  Diagnosis Date  . Acute myocardial infarction, subendocardial infarction, initial episode of care ([place]) 07/21/2012  . Acute osteomyelitis involving ankle and foot ([place]) 02/06/2015  . Acute systolic heart failure ([place]) 07/21/2012   New onset 07/19/12; admission to [facility] ED. Elevated Troponins.  S/p 2D-echo with EF 20-25%.   S/p cardiac catheterization with stenting LAD.  Repeat 2D-echo 10/2011 with improved EF of 35%.   . Anemia   . Automatic implantable cardioverter-defibrillator in situ    a. MDT CRT-D 06/2014, SN: [id number] H  .removed in feb 2020  . CAD (coronary artery disease)    a. cardiac cath 101/04/2012: PCI/DES to chronically occluded mLAD, consideration PCI to diag branch in 4 weeks.   . Cataract   . Chicken pox   . Chronic kidney disease   . Chronic systolic CHF (congestive heart failure) (HCC)    a. mixed ICM & NICM; b. EF 20-25% by echo 07/2012, mid-dist 2/3 of LV sev HK/AK, mild MR. echo 10/2012: EF 30-35%, sev HK ant-septal & inf walls, GR1DD, mild MR, PASP 33. c. echo 02/2013: EF 30%, GR1DD, mild MR. echo 04/2014: EF 30%, Septal-lat dyssynchrony, global HK, inf AK, GR1DD, mild MR. d. echo 10/2014: EF50-55%, WM nl, GR1DD, septal mild paradox. e. echo 02/2015: EF 50-55%, wm   . Depression   . Heart attack ([place])   . Heel ulcer ([place]) 04/27/2015  . History of blood transfusion ~ 2011   "plasma; had neuropathy; couldn't walk"  . Hypertension   . LBBB (left bundle branch block)   . Neuromuscular disorder ([place])   . Neuropathy 2011  . Obesity, unspecified   . OSA on CPAP    Moderate with AHI 23/hr and now on CPAP at 16cm H2O.  Her  DME is [facility]  . Pure hypercholesterolemia   . Sepsis ([place]) 09/2018  . Type II diabetes mellitus ([place])   . Unspecified vitamin D deficiency     Past Surgical History:  Procedure Laterality Date  . AMPUTATION TOE Right 06/18/2015   Procedure: AMPUTATION TOE;  Surgeon: Samara Deist, DPM;  Location: [facility] ORS;  Service: Podiatry;  Laterality: Right;  . AMPUTATION TOE Left 10/08/2018   Procedure: AMPUTATION TOE LEFT 2ND;  Surgeon: Albertine Patricia, DPM;  Location: [facility] ORS;  Service: Podiatry;  Laterality: Left;  . BACK SURGERY    . BI-VENTRICULAR IMPLANTABLE CARDIOVERTER DEFIBRILLATOR N/A 07/06/2014   Procedure: BI-VENTRICULAR IMPLANTABLE CARDIOVERTER DEFIBRILLATOR  (CRT-D);   Surgeon: Deboraha Sprang, MD;  Location: [facility] CATH LAB;  Service: Cardiovascular;  Laterality: N/A;  . BI-VENTRICULAR IMPLANTABLE CARDIOVERTER DEFIBRILLATOR  (CRT-D)  07/06/2014  . BILATERAL OOPHORECTOMY  01/2011   ovarian cyst benign  . BIV ICD INSERTION CRT-D N/A 03/31/2019   Procedure: BIV ICD INSERTION CRT-D;  Surgeon: Constance Haw, MD;  Location: [place] CV LAB;  Service: Cardiovascular;  Laterality: N/A;  . COLONOSCOPY WITH PROPOFOL Left 02/22/2015   Procedure: COLONOSCOPY WITH PROPOFOL;  Surgeon: Hulen Luster, MD;  Location: [facility] ENDOSCOPY;  Service: Endoscopy;  Laterality: Left;  . CORONARY ANGIOPLASTY WITH STENT PLACEMENT Left 07/2012   new onset systolic CHF; elevated troponins.  Cardiac catheterization with stenting to LAD; EF 15%.  2D-echo: EF 20-25%.  . ESOPHAGOGASTRODUODENOSCOPY N/A 02/22/2015   Procedure: ESOPHAGOGASTRODUODENOSCOPY (EGD);  Surgeon: Hulen Luster, MD;  Location: [name] ENDOSCOPY;  Service: Endoscopy;  Laterality: N/A;  . INCISION AND DRAINAGE ABSCESS Right 2007   groin; with ICU stay due to sepsis.  Marland Kitchen LAPAROSCOPIC CHOLECYSTECTOMY  2011  . LEFT HEART CATH AND CORONARY ANGIOGRAPHY N/A 10/05/2018   Procedure: LEFT HEART CATH AND CORONARY ANGIOGRAPHY;  Surgeon: Minna Merritts, MD;  Location: [place] CV LAB;  Service: Cardiovascular;  Laterality: N/A;  . LEFT HEART CATHETERIZATION WITH CORONARY ANGIOGRAM N/A 07/21/2012   Procedure: LEFT HEART CATHETERIZATION WITH CORONARY ANGIOGRAM;  Surgeon: Jolaine Artist, MD;  Location: [facility] CATH LAB;  Service: Cardiovascular;  Laterality: N/A;  . [place]   L4-5  . PACEMAKER REMOVAL  11/2018   due to infection around pacemaker  . PERCUTANEOUS CORONARY STENT INTERVENTION (PCI-S) N/A 07/23/2012   Procedure: PERCUTANEOUS CORONARY STENT INTERVENTION (PCI-S);  Surgeon: Sherren Mocha, MD;  Location: [facility] CATH LAB;  Service: Cardiovascular;  Laterality: N/A;  . PERIPHERAL VASCULAR BALLOON ANGIOPLASTY Left  10/06/2018   Procedure: PERIPHERAL VASCULAR BALLOON ANGIOPLASTY;  Surgeon: Katha Cabal, MD;  Location: [place] CV LAB;  Service: Cardiovascular;  Laterality: Left;  . PERIPHERAL VASCULAR CATHETERIZATION N/A 02/10/2015   Procedure: Picc Line Insertion;  Surgeon: Katha Cabal, MD;  Location: [place] CV LAB;  Service: Cardiovascular;  Laterality: N/A;  . RIGHT HEART CATH N/A 03/29/2019   Procedure: RIGHT HEART CATH;  Surgeon: Jolaine Artist, MD;  Location: [place] CV LAB;  Service: Cardiovascular;  Laterality: N/A;  . TEE WITHOUT CARDIOVERSION N/A 10/02/2018   Procedure: TRANSESOPHAGEAL ECHOCARDIOGRAM (TEE);  Surgeon: Wellington Hampshire, MD;  Location: [facility] ORS;  Service: Cardiovascular;  Laterality: N/A;  . [place]  . VAGINAL HYSTERECTOMY  01/2011   Fibroids/DUB.  Ovaries removed. [place].    Current Meds  Medication Sig  . amiodarone (PACERONE) 200 MG tablet Take 200 mg by mouth daily.  . Calcium Carbonate-Vitamin D3 (CALCIUM 600-D) 600-400 MG-UNIT TABS Take  1 tablet by mouth 2 (two) times a day.  . clopidogrel (PLAVIX) 75 MG tablet Take 1 tablet (75 mg total) by mouth daily.  . ferrous sulfate 325 (65 FE) MG tablet Take 325 mg by mouth 2 (two) times daily with a meal.  . gabapentin (NEURONTIN) 600 MG tablet Take 1 tablet (600 mg total) by mouth 2 (two) times daily. Takes 1 in the morning and two at bedtime  . glucose blood (ONE TOUCH ULTRA TEST) test strip USE AS DIRECTED TO CHECK BLOOD SUGAR 3 TIMES DAILY  . hydrALAZINE (APRESOLINE) 25 MG tablet Take 1/2 tablet by mouth 3 times a day.  . insulin aspart (NOVOLOG) 100 UNIT/ML FlexPen Inject 5 Units into the skin 3 (three) times daily with meals.  . Insulin Glargine (LANTUS SOLOSTAR) 100 UNIT/ML Solostar Pen Inject 22 Units into the skin daily.  . Insulin Pen Needle (PEN NEEDLES) 32G X 6 MM MISC 1 each by Does not apply route 4 (four) times daily.  . isosorbide mononitrate (IMDUR) 30 MG 24 hr tablet  Take 1 tablet (30 mg total) by mouth daily.  Marland Kitchen levothyroxine (SYNTHROID) 50 MCG tablet Take 1 tablet (50 mcg total) by mouth daily before breakfast.  . losartan (COZAAR) 25 MG tablet Take 0.5 tablets (12.5 mg total) by mouth daily.  . Multiple Vitamin (MULTIVITAMIN) tablet Take 1 tablet by mouth daily.  . Omega-3 Fatty Acids (FISH OIL) 1000 MG CAPS Take 1 capsule by mouth 2 (two) times a day.   . rosuvastatin (CRESTOR) 10 MG tablet Take 1 tablet (10 mg total) by mouth daily.  Marland Kitchen torsemide (DEMADEX) 20 MG tablet Take 80 mg by mouth daily.  . traMADol (ULTRAM) 50 MG tablet Take 1 tablet (50 mg total) by mouth every 6 (six) hours as needed.    No Known Allergies    Review of Systems negative except from HPI and PMH  Physical Exam BP (!) 118/54 (BP Location: Left Arm, Patient Position: Sitting, Cuff Size: Large)   Pulse 67   Ht 5\' 8"  (1.727 m)   Wt 238 lb 4 oz (108.1 kg)   SpO2 98%   BMI 36.23 kg/m   .Well developed and well nourished in no acute distress HENT normal Neck supple with JVP-flat Clear Device pocket well healed; without hematoma or erythema.  There is no tethering  Regular rate and rhythm, no  gallop No  murmur Abd-soft with active BS No Clubbing cyanosis   edema Skin-warm and dry A & Oriented  Grossly normal sensory and motor function  ECG P-synchronous/ AV  Pacing qR lead 1 and neg lead V1    Assessment and  Plan  CRT-D recent reimplantation following extraction  Nonischemic myopathy  Congestive heart failure-chronic-systolic class II   Hypothyroidism  High Risk Medication Surveillance  PVCs  Euvolemic continue current meds  Functional status much improved coincidental with LVEF improvement following reimplantation.  ECG not ideal for BiV pacing, but for now with interval improvement will not make any adjustments-- repeat echo in 3 m  Tolerating amio-- surveillance labs--ok  TSH much better from 9/20 so will wait until recheck in about 3 to adjust  replacement  Will begin carvedilol

## 2019-09-14 NOTE — Patient Instructions (Signed)
Medication Instructions:  - Your physician has recommended you make the following change in your medication:   1) Start coreg (carvedilol) 3.125 mg- take 1 tablet by mouth twice a day  *If you need a refill on your cardiac medications before your next appointment, please call your pharmacy*  Lab Work: - none ordered  If you have labs (blood work) drawn today and your tests are completely normal, you will receive your results only by: Marland Kitchen MyChart Message (if you have MyChart) OR . A paper copy in the mail If you have any lab test that is abnormal or we need to change your treatment, we will call you to review the results.  Testing/Procedures: - Your physician has requested that you have a limited echocardiogram (in 6 months/ just prior to you appointment with Dr. Caryl Comes). Echocardiography is a painless test that uses sound waves to create images of your heart. It provides your doctor with information about the size and shape of your heart and how well your heart's chambers and valves are working. This procedure takes approximately one hour. There are no restrictions for this procedure.   Follow-Up: At [facility], you and your health needs are our priority.  As part of our continuing mission to provide you with exceptional heart care, we have created designated Provider Care Teams.  These Care Teams include your primary Cardiologist (physician) and Advanced Practice Providers (APPs -  Physician Assistants and Nurse Practitioners) who all work together to provide you with the care you need, when you need it.  Your next appointment:   6 month(s)  The format for your next appointment:   In Person  Provider:   Virl Axe, MD  Other Instructions n/a

## 2019-09-27 ENCOUNTER — Other Ambulatory Visit: Payer: PPO

## 2019-09-28 ENCOUNTER — Other Ambulatory Visit: Payer: PPO | Admitting: Nurse Practitioner

## 2019-09-28 ENCOUNTER — Other Ambulatory Visit: Payer: Self-pay

## 2019-09-28 ENCOUNTER — Telehealth: Payer: Self-pay | Admitting: Nurse Practitioner

## 2019-09-28 NOTE — Telephone Encounter (Signed)
I called [name] for scheduled palliative telemedicine, no answer, message left with contact information.

## 2019-09-29 ENCOUNTER — Telehealth: Payer: Self-pay | Admitting: Nurse Practitioner

## 2019-09-29 NOTE — Telephone Encounter (Signed)
I called [name], [name] to schedule f/u palliative care visit, no answer, message left with contact information

## 2019-09-30 ENCOUNTER — Ambulatory Visit (INDEPENDENT_AMBULATORY_CARE_PROVIDER_SITE_OTHER): Payer: PPO | Admitting: *Deleted

## 2019-09-30 DIAGNOSIS — I5022 Chronic systolic (congestive) heart failure: Secondary | ICD-10-CM

## 2019-10-01 LAB — CUP PACEART REMOTE DEVICE CHECK
Battery Remaining Longevity: 90 mo
Battery Voltage: 3.02 V
Brady Statistic AP VP Percent: 4.03 %
Brady Statistic AP VS Percent: 0.02 %
Brady Statistic AS VP Percent: 95.81 %
Brady Statistic AS VS Percent: 0.14 %
Brady Statistic RA Percent Paced: 4.01 %
Brady Statistic RV Percent Paced: 98.96 %
Date Time Interrogation Session: 20201224022503
HighPow Impedance: 44 Ohm
Implantable Lead Implant Date: 20200625
Implantable Lead Implant Date: 20200625
Implantable Lead Implant Date: 20200625
Implantable Lead Location: 753858
Implantable Lead Location: 753859
Implantable Lead Location: 753860
Implantable Lead Model: 4398
Implantable Lead Model: 5076
Implantable Lead Model: 6935
Implantable Pulse Generator Implant Date: 20200625
Lead Channel Impedance Value: 188.1 Ohm
Lead Channel Impedance Value: 188.1 Ohm
Lead Channel Impedance Value: 209 Ohm
Lead Channel Impedance Value: 211.021
Lead Channel Impedance Value: 237.686
Lead Channel Impedance Value: 342 Ohm
Lead Channel Impedance Value: 361 Ohm
Lead Channel Impedance Value: 399 Ohm
Lead Channel Impedance Value: 418 Ohm
Lead Channel Impedance Value: 418 Ohm
Lead Channel Impedance Value: 418 Ohm
Lead Channel Impedance Value: 551 Ohm
Lead Channel Impedance Value: 703 Ohm
Lead Channel Impedance Value: 703 Ohm
Lead Channel Impedance Value: 703 Ohm
Lead Channel Impedance Value: 779 Ohm
Lead Channel Impedance Value: 836 Ohm
Lead Channel Impedance Value: 893 Ohm
Lead Channel Pacing Threshold Amplitude: 0.625 V
Lead Channel Pacing Threshold Amplitude: 0.75 V
Lead Channel Pacing Threshold Amplitude: 1 V
Lead Channel Pacing Threshold Pulse Width: 0.4 ms
Lead Channel Pacing Threshold Pulse Width: 0.4 ms
Lead Channel Pacing Threshold Pulse Width: 1 ms
Lead Channel Sensing Intrinsic Amplitude: 0.625 mV
Lead Channel Sensing Intrinsic Amplitude: 0.625 mV
Lead Channel Sensing Intrinsic Amplitude: 15.875 mV
Lead Channel Sensing Intrinsic Amplitude: 15.875 mV
Lead Channel Setting Pacing Amplitude: 2 V
Lead Channel Setting Pacing Amplitude: 2 V
Lead Channel Setting Pacing Amplitude: 2 V
Lead Channel Setting Pacing Pulse Width: 0.4 ms
Lead Channel Setting Pacing Pulse Width: 1 ms
Lead Channel Setting Sensing Sensitivity: 0.3 mV

## 2019-10-05 ENCOUNTER — Telehealth: Payer: Self-pay | Admitting: Nurse Practitioner

## 2019-10-05 DIAGNOSIS — G4733 Obstructive sleep apnea (adult) (pediatric): Secondary | ICD-10-CM | POA: Diagnosis not present

## 2019-10-05 DIAGNOSIS — J189 Pneumonia, unspecified organism: Secondary | ICD-10-CM | POA: Diagnosis not present

## 2019-10-05 DIAGNOSIS — M86171 Other acute osteomyelitis, right ankle and foot: Secondary | ICD-10-CM | POA: Diagnosis not present

## 2019-10-05 DIAGNOSIS — I5022 Chronic systolic (congestive) heart failure: Secondary | ICD-10-CM | POA: Diagnosis not present

## 2019-10-05 NOTE — Telephone Encounter (Signed)
Rec'd call back from patient and we have rescheduled the Telephone f/u visit to 11/04/19 @ 10 AM

## 2019-10-05 NOTE — Telephone Encounter (Signed)
Called patient to reschedule the 09/28/19 f/u visit - no answer, left message with reason for call along with my contact information

## 2019-10-07 ENCOUNTER — Telehealth (HOSPITAL_COMMUNITY): Payer: Self-pay

## 2019-10-07 NOTE — Telephone Encounter (Signed)
Today wasa a telephone appt with [name].  She states been doing really well.  She feels good, weight is trending down, today was 229 lbs.  She has no swelling, denies chest pain, shortness of breath, headaches or dizziness.  She is watching high sodium foods and how much fluids a day she intakes.  Her sugars have been giving her some problems, her doctor has been switching her meds around.  She has been getting good feed back from cardiology appts.  She is aware of appts coming up.  She is active and gets around by her self.  She is enjoying life again.  She has all her medications, verified them.  She is aware of how to take her medications.  Appetite is good.  Will continue to visit for heart failure.   [place] [phone]

## 2019-10-22 ENCOUNTER — Other Ambulatory Visit: Payer: Self-pay

## 2019-10-22 ENCOUNTER — Other Ambulatory Visit: Payer: Self-pay | Admitting: Family Medicine

## 2019-10-22 NOTE — Telephone Encounter (Signed)
Pt is requesting medication 

## 2019-10-22 NOTE — Telephone Encounter (Signed)
Requested medication (s) are due for refill today: yes  Requested medication (s) are on the active medication list:  Yes  Future visit scheduled:  Yes  Last Refill: 08/27/19; # 120; no RF  Requested Prescriptions  Pending Prescriptions Disp Refills   traMADol (ULTRAM) 50 MG tablet [Pharmacy Med Name: TRAMADOL HCL 50 MG TAB] 120 tablet     Sig: TAKE 1 TABLET BY MOUTH EVERY 6 HOURS AS NEEDED      Not Delegated - Analgesics:  Opioid Agonists Failed - 10/22/2019 11:55 AM      Failed - This refill cannot be delegated      Failed - Urine Drug Screen completed in last 360 days.      Passed - Valid encounter within last 6 months    Recent Outpatient Visits           1 month ago DM type 2, uncontrolled, with renal complications [facility])   [facility], Lilia Argue, MD   6 months ago Diabetic peripheral neuropathy associated with type 2 diabetes mellitus [facility])   [facility], Lilia Argue, MD   7 months ago Acute on chronic systolic CHF (congestive heart failure) [facility])   [facility], Lilia Argue, MD   8 months ago Acute on chronic systolic CHF (congestive heart failure) [facility])   [facility], Lilia Argue, MD   8 months ago Acute on chronic systolic CHF (congestive heart failure) [facility])   [facility], Lilia Argue, MD       Future Appointments             In 1 month Rutherford Guys, MD Primary Care at [place], [facility]             Signed Prescriptions Disp Refills   gabapentin (NEURONTIN) 600 MG tablet 270 tablet 0    Sig: TAKE 1 TABLET BY IN THE MORNING AND 2 ATBEDTIME      Neurology: Anticonvulsants - gabapentin Passed - 10/22/2019 11:55 AM      Passed - Valid encounter within last 12 months    Recent Outpatient Visits           1 month ago DM type 2, uncontrolled, with renal complications [facility])   [facility], Lilia Argue, MD   6 months ago Diabetic peripheral neuropathy associated with type 2  diabetes mellitus [facility])   [facility], Lilia Argue, MD   7 months ago Acute on chronic systolic CHF (congestive heart failure) [facility])   [facility], Lilia Argue, MD   8 months ago Acute on chronic systolic CHF (congestive heart failure) [facility])   [facility], Lilia Argue, MD   8 months ago Acute on chronic systolic CHF (congestive heart failure) [facility])   [facility], Lilia Argue, MD       Future Appointments             In 1 month Rutherford Guys, MD Primary Care at [place], [facility]              clopidogrel (PLAVIX) 75 MG tablet 90 tablet 0    Sig: TAKE 1 TABLET BY MOUTH ONCE DAILY      Hematology: Antiplatelets - clopidogrel Failed - 10/22/2019 11:55 AM      Failed - Evaluate AST, ALT within 2 months of therapy initiation.      Failed -  HCT in normal range and within 180 days    Hematocrit  Date Value Ref Range Status  09/06/2019 33.6 (L) 34.0 - 46.6 % Final          Passed - ALT in normal range and within 360 days    ALT  Date Value Ref Range Status  09/06/2019 23 0 - 32 IU/L Final          Passed - AST in normal range and within 360 days    AST  Date Value Ref Range Status  09/06/2019 32 0 - 40 IU/L Final          Passed - HGB in normal range and within 180 days    Hemoglobin  Date Value Ref Range Status  09/06/2019 11.2 11.1 - 15.9 g/dL Final          Passed - PLT in normal range and within 180 days    Platelets  Date Value Ref Range Status  09/06/2019 196 150 - 450 x10E3/uL Final   Platelet Count, POC  Date Value Ref Range Status  02/24/2018 191 142 - 424 K/uL Final          Passed - Valid encounter within last 6 months    Recent Outpatient Visits           1 month ago DM type 2, uncontrolled, with renal complications [facility])   [facility], Lilia Argue, MD   6 months ago Diabetic peripheral neuropathy associated with type 2 diabetes mellitus [facility])   [facility], Lilia Argue, MD   7 months ago Acute on chronic systolic CHF (congestive heart failure) [facility])   [facility], Lilia Argue, MD   8 months ago Acute on chronic systolic CHF (congestive heart failure) [facility])   [facility], Lilia Argue, MD   8 months ago Acute on chronic systolic CHF (congestive heart failure) [facility])   [facility] at [facility], Lilia Argue, MD       Future Appointments             In 1 month Rutherford Guys, MD Primary Care at [place], [facility]

## 2019-10-22 NOTE — Telephone Encounter (Signed)
Requested Prescriptions  Pending Prescriptions Disp Refills  . gabapentin (NEURONTIN) 600 MG tablet [Pharmacy Med Name: GABAPENTIN 600 MG TAB] 270 tablet 0    Sig: TAKE 1 TABLET BY IN THE MORNING AND 2 ATBEDTIME     Neurology: Anticonvulsants - gabapentin Passed - 10/22/2019 11:55 AM      Passed - Valid encounter within last 12 months    Recent Outpatient Visits          1 month ago DM type 2, uncontrolled, with renal complications [facility])   [facility], Lilia Argue, MD   6 months ago Diabetic peripheral neuropathy associated with type 2 diabetes mellitus [facility])   [facility], Lilia Argue, MD   7 months ago Acute on chronic systolic CHF (congestive heart failure) [facility])   [facility] at [facility], Lilia Argue, MD   8 months ago Acute on chronic systolic CHF (congestive heart failure) [facility])   [facility], Lilia Argue, MD   8 months ago Acute on chronic systolic CHF (congestive heart failure) [facility])   [facility] at [facility], Lilia Argue, MD      Future Appointments            In 1 month Rutherford Guys, MD Primary Care at [place], [facility]           . clopidogrel (PLAVIX) 75 MG tablet [Pharmacy Med Name: CLOPIDOGREL BISULFATE 75 MG TAB] 90 tablet 0    Sig: TAKE 1 TABLET BY MOUTH ONCE DAILY     Hematology: Antiplatelets - clopidogrel Failed - 10/22/2019 11:55 AM      Failed - Evaluate AST, ALT within 2 months of therapy initiation.      Failed - HCT in normal range and within 180 days    Hematocrit  Date Value Ref Range Status  09/06/2019 33.6 (L) 34.0 - 46.6 % Final         Passed - ALT in normal range and within 360 days    ALT  Date Value Ref Range Status  09/06/2019 23 0 - 32 IU/L Final         Passed - AST in normal range and within 360 days    AST  Date Value Ref Range Status  09/06/2019 32 0 - 40 IU/L Final         Passed - HGB in normal range and within 180 days    Hemoglobin  Date Value Ref Range Status  09/06/2019  11.2 11.1 - 15.9 g/dL Final         Passed - PLT in normal range and within 180 days    Platelets  Date Value Ref Range Status  09/06/2019 196 150 - 450 x10E3/uL Final   Platelet Count, POC  Date Value Ref Range Status  02/24/2018 191 142 - 424 K/uL Final         Passed - Valid encounter within last 6 months    Recent Outpatient Visits          1 month ago DM type 2, uncontrolled, with renal complications [facility])   [facility], Lilia Argue, MD   6 months ago Diabetic peripheral neuropathy associated with type 2 diabetes mellitus [facility])   [facility], Lilia Argue, MD   7 months ago Acute on chronic systolic CHF (congestive heart failure) [facility])   [facility], Lilia Argue, MD   8 months ago Acute on  chronic systolic CHF (congestive heart failure) [facility])   [facility], Lilia Argue, MD   8 months ago Acute on chronic systolic CHF (congestive heart failure) [facility])   [facility], Lilia Argue, MD      Future Appointments            In 1 month Rutherford Guys, MD Primary Care at [place], [place]  . traMADol (ULTRAM) 50 MG tablet [Pharmacy Med Name: TRAMADOL HCL 50 MG TAB] 120 tablet     Sig: TAKE 1 TABLET BY MOUTH EVERY 6 HOURS AS NEEDED     Not Delegated - Analgesics:  Opioid Agonists Failed - 10/22/2019 11:55 AM      Failed - This refill cannot be delegated      Failed - Urine Drug Screen completed in last 360 days.      Passed - Valid encounter within last 6 months    Recent Outpatient Visits          1 month ago DM type 2, uncontrolled, with renal complications [facility])   [facility], Lilia Argue, MD   6 months ago Diabetic peripheral neuropathy associated with type 2 diabetes mellitus [facility])   [facility], Lilia Argue, MD   7 months ago Acute on chronic systolic CHF (congestive heart failure) [facility])   [facility],  Lilia Argue, MD   8 months ago Acute on chronic systolic CHF (congestive heart failure) [facility])   [facility], Lilia Argue, MD   8 months ago Acute on chronic systolic CHF (congestive heart failure) [facility])   [facility], Lilia Argue, MD      Future Appointments            In 1 month Rutherford Guys, MD Primary Care at [place], [facility]

## 2019-10-22 NOTE — Telephone Encounter (Signed)
pmp reviewd, appropriate meds refilled 

## 2019-10-25 ENCOUNTER — Ambulatory Visit (HOSPITAL_COMMUNITY)
Admission: RE | Admit: 2019-10-25 | Discharge: 2019-10-25 | Disposition: A | Discharge status: Home / Self Care | Payer: PPO | Source: Ambulatory Visit | Attending: Internal Medicine | Admitting: Internal Medicine

## 2019-10-25 ENCOUNTER — Encounter (HOSPITAL_COMMUNITY): Payer: Self-pay | Admitting: Internal Medicine

## 2019-10-25 ENCOUNTER — Other Ambulatory Visit: Payer: Self-pay

## 2019-10-25 VITALS — BP 120/52 | HR 54 | Wt 244.0 lb

## 2019-10-25 DIAGNOSIS — E039 Hypothyroidism, unspecified: Secondary | ICD-10-CM | POA: Insufficient documentation

## 2019-10-25 DIAGNOSIS — I493 Ventricular premature depolarization: Secondary | ICD-10-CM | POA: Diagnosis not present

## 2019-10-25 DIAGNOSIS — Z8349 Family history of other endocrine, nutritional and metabolic diseases: Secondary | ICD-10-CM | POA: Insufficient documentation

## 2019-10-25 DIAGNOSIS — I471 Supraventricular tachycardia: Secondary | ICD-10-CM | POA: Diagnosis not present

## 2019-10-25 DIAGNOSIS — Z9851 Tubal ligation status: Secondary | ICD-10-CM | POA: Insufficient documentation

## 2019-10-25 DIAGNOSIS — E669 Obesity, unspecified: Secondary | ICD-10-CM | POA: Diagnosis not present

## 2019-10-25 DIAGNOSIS — G4733 Obstructive sleep apnea (adult) (pediatric): Secondary | ICD-10-CM | POA: Insufficient documentation

## 2019-10-25 DIAGNOSIS — Z794 Long term (current) use of insulin: Secondary | ICD-10-CM | POA: Insufficient documentation

## 2019-10-25 DIAGNOSIS — Z8249 Family history of ischemic heart disease and other diseases of the circulatory system: Secondary | ICD-10-CM | POA: Insufficient documentation

## 2019-10-25 DIAGNOSIS — E1122 Type 2 diabetes mellitus with diabetic chronic kidney disease: Secondary | ICD-10-CM | POA: Insufficient documentation

## 2019-10-25 DIAGNOSIS — Z79890 Hormone replacement therapy (postmenopausal): Secondary | ICD-10-CM | POA: Insufficient documentation

## 2019-10-25 DIAGNOSIS — I252 Old myocardial infarction: Secondary | ICD-10-CM | POA: Insufficient documentation

## 2019-10-25 DIAGNOSIS — Z89421 Acquired absence of other right toe(s): Secondary | ICD-10-CM | POA: Insufficient documentation

## 2019-10-25 DIAGNOSIS — E7800 Pure hypercholesterolemia, unspecified: Secondary | ICD-10-CM | POA: Insufficient documentation

## 2019-10-25 DIAGNOSIS — Z79899 Other long term (current) drug therapy: Secondary | ICD-10-CM | POA: Diagnosis not present

## 2019-10-25 DIAGNOSIS — I5082 Biventricular heart failure: Secondary | ICD-10-CM | POA: Insufficient documentation

## 2019-10-25 DIAGNOSIS — Z7902 Long term (current) use of antithrombotics/antiplatelets: Secondary | ICD-10-CM | POA: Diagnosis not present

## 2019-10-25 DIAGNOSIS — I081 Rheumatic disorders of both mitral and tricuspid valves: Secondary | ICD-10-CM | POA: Diagnosis not present

## 2019-10-25 DIAGNOSIS — Z8052 Family history of malignant neoplasm of bladder: Secondary | ICD-10-CM | POA: Insufficient documentation

## 2019-10-25 DIAGNOSIS — I130 Hypertensive heart and chronic kidney disease with heart failure and stage 1 through stage 4 chronic kidney disease, or unspecified chronic kidney disease: Secondary | ICD-10-CM | POA: Diagnosis not present

## 2019-10-25 DIAGNOSIS — Z9049 Acquired absence of other specified parts of digestive tract: Secondary | ICD-10-CM | POA: Insufficient documentation

## 2019-10-25 DIAGNOSIS — N184 Chronic kidney disease, stage 4 (severe): Secondary | ICD-10-CM | POA: Diagnosis not present

## 2019-10-25 DIAGNOSIS — I2510 Atherosclerotic heart disease of native coronary artery without angina pectoris: Secondary | ICD-10-CM | POA: Diagnosis not present

## 2019-10-25 DIAGNOSIS — I5022 Chronic systolic (congestive) heart failure: Secondary | ICD-10-CM | POA: Diagnosis not present

## 2019-10-25 DIAGNOSIS — Z808 Family history of malignant neoplasm of other organs or systems: Secondary | ICD-10-CM | POA: Insufficient documentation

## 2019-10-25 DIAGNOSIS — Z955 Presence of coronary angioplasty implant and graft: Secondary | ICD-10-CM | POA: Insufficient documentation

## 2019-10-25 DIAGNOSIS — Z90722 Acquired absence of ovaries, bilateral: Secondary | ICD-10-CM | POA: Insufficient documentation

## 2019-10-25 DIAGNOSIS — Z95810 Presence of automatic (implantable) cardiac defibrillator: Secondary | ICD-10-CM | POA: Diagnosis not present

## 2019-10-25 DIAGNOSIS — I5042 Chronic combined systolic (congestive) and diastolic (congestive) heart failure: Secondary | ICD-10-CM | POA: Diagnosis not present

## 2019-10-25 DIAGNOSIS — R9431 Abnormal electrocardiogram [ECG] [EKG]: Secondary | ICD-10-CM | POA: Insufficient documentation

## 2019-10-25 DIAGNOSIS — Z89422 Acquired absence of other left toe(s): Secondary | ICD-10-CM | POA: Insufficient documentation

## 2019-10-25 DIAGNOSIS — E1151 Type 2 diabetes mellitus with diabetic peripheral angiopathy without gangrene: Secondary | ICD-10-CM | POA: Diagnosis not present

## 2019-10-25 DIAGNOSIS — Z807 Family history of other malignant neoplasms of lymphoid, hematopoietic and related tissues: Secondary | ICD-10-CM | POA: Insufficient documentation

## 2019-10-25 DIAGNOSIS — I472 Ventricular tachycardia: Secondary | ICD-10-CM | POA: Diagnosis not present

## 2019-10-25 DIAGNOSIS — I447 Left bundle-branch block, unspecified: Secondary | ICD-10-CM | POA: Insufficient documentation

## 2019-10-25 DIAGNOSIS — Z833 Family history of diabetes mellitus: Secondary | ICD-10-CM | POA: Insufficient documentation

## 2019-10-25 DIAGNOSIS — Z8042 Family history of malignant neoplasm of prostate: Secondary | ICD-10-CM | POA: Insufficient documentation

## 2019-10-25 DIAGNOSIS — E1136 Type 2 diabetes mellitus with diabetic cataract: Secondary | ICD-10-CM | POA: Insufficient documentation

## 2019-10-25 DIAGNOSIS — I428 Other cardiomyopathies: Secondary | ICD-10-CM | POA: Diagnosis not present

## 2019-10-25 DIAGNOSIS — D649 Anemia, unspecified: Secondary | ICD-10-CM | POA: Insufficient documentation

## 2019-10-25 DIAGNOSIS — Z809 Family history of malignant neoplasm, unspecified: Secondary | ICD-10-CM | POA: Insufficient documentation

## 2019-10-25 NOTE — Patient Instructions (Signed)
Please give [place] a call in September to schedule your follow up appointment.  If you have any questions or concerns before your next appointment please send [place] a message through [place] or call our office at [phone].  At the [facility], you and your health needs are our priority. As part of our continuing mission to provide you with exceptional heart care, we have created designated Provider Care Teams. These Care Teams include your primary Cardiologist (physician) and Advanced Practice Providers (APPs- Physician Assistants and Nurse Practitioners) who all work together to provide you with the care you need, when you need it.   You may see any of the following providers on your designated Care Team at your next follow up: Marland Kitchen Dr Glori Bickers . Dr [name] . Darrick Grinder, NP . Lyda Jester, PA . Audry Riles, PharmD   Please be sure to bring in all your medications bottles to every appointment.

## 2019-10-25 NOTE — Progress Notes (Addendum)
Advanced Heart Failure Clinic Progress Note     10/25/2019 [name]   April 09, 1956  [id number]  Primary Physician Rutherford Guys, MD Primary Cardiologist: Ida Rogue, MD  Electrophysiologist: Virl Axe, MD   Advanced HF: Dr. Haroldine Laws  Sleep Clinic: Dr. Radford Pax   Reason for Visit/CC: f/u for chronic systolic HF  HPI:  [name] is a 64 y.o. female with h/o CAD w/ prior MI>>LAD DES,LBBB, mixedNICM/ICM combinedsystolic and diastolic CHF,status post CRT-D 2015 with improvement in EF to 40 to 45%>>10-15% after ICD removed 2nd bacteremia,, DM 2, HTN, obesity,PAD w/ PTA L anterior tibial artery 10/06/2018,OSA on CPAP, NSTEMI 12/2019s/p cath w/ med rx for CAD>>2/2 MSSA bacteremia s/p toe amputation, TV and lead vegetations, s/p Angiovac &ICD removal at [facility].   Admitted on 03/23/19 with A/C biventricular HF.EF improved previously with CRT but now back down again after CRT removal due to sepsis.She had marked volume overload on exam and rising creatinine. Possibly low output. Started on IV lasix + metolazone.Placed on milrinone which was later weaned off. Unfortunately while hospitalized her husband had CVA and passed away on 14-Apr-2023. On 04-14-2023 she underwent CRT-D. She was discharged that admit, still  with volume overload and worsening renal function, but she was anxious to go home and plan her husbands funeral. Discharge weight 287 pounds. Discharge with [facility]. Had f/u with Darrick Grinder NP on 05/10/19 and was doing well from a symptom standpoint. Volume status was stable. Hydralazine + nitro added to regimen. No ARB, dig or spiro due to CKD. Also on amiodarone for PAT/ NSVT. Recently seen by Dr. Caryl Comes and TSH was high and pt started on levothyroxine. She has f/u again w/ him in 3 weeks. He placed order to repeat echo in Dec.   Here for routine f/u. Doing pretty well. Can do most activities including going to the store without problem. Gets SOB with stairs or if she goes to fast.  No edema, orthopnea or PND. No CP. No problem with meds. No dizziness. BP at home 120/60.    Cardiac Studies  R[facility] 03/2019  Hemo Data   Most Recent Value  Fick Cardiac Output 5.37 L/min  Fick Cardiac Output Index 2.24 (L/min)/BSA  RA A Wave 16 mmHg  RA V Wave 18 mmHg  RA Mean 16 mmHg  RV Systolic Pressure 57 mmHg  RV Diastolic Pressure 8 mmHg  RV EDP 20 mmHg  PA Systolic Pressure 57 mmHg  PA Diastolic Pressure 28 mmHg  PA Mean 40 mmHg  PW A Wave 27 mmHg  PW V Wave 36 mmHg  PW Mean 27 mmHg  QP/QS 1  TPVR Index 17.85 HRUI    2D Echo 03/2019 1. The left ventricle has severely reduced systolic function, with an ejection fraction of 10-15%. The cavity was mildly dilated. There is mildly increased left ventricular wall thickness. Left ventricular diastolic Doppler parameters are indeterminate.  2. The right ventricle has severely reduced systolic function. The cavity was moderately enlarged. There is no increase in right ventricular wall thickness. Right ventricular systolic pressure is moderately elevated with an estimated pressure of 45.2  mmHg.  3. Left atrial size was mildly dilated.  4. Right atrial size was moderately dilated.  5. The mitral valve is degenerative. Mild thickening of the mitral valve leaflet. There is mild mitral annular calcification present. Mitral valve regurgitation is moderate by color flow Doppler.  6. Tricuspid valve regurgitation is moderate-severe.  7. The aortic valve is tricuspid. Mild thickening of the  aortic valve. Aortic valve regurgitation is trivial by color flow Doppler.  8. The aortic root and ascending aorta are normal in size and structure.  9. The inferior vena cava was dilated in size with <50% respiratory variability.    Current Meds  Medication Sig  . amiodarone (PACERONE) 200 MG tablet Take 200 mg by mouth daily.  . Calcium Carbonate-Vitamin D3 (CALCIUM 600-D) 600-400 MG-UNIT TABS Take 1 tablet by mouth 2 (two) times a day.  .  carvedilol (COREG) 3.125 MG tablet Take 1 tablet (3.125 mg total) by mouth 2 (two) times daily with a meal.  . clopidogrel (PLAVIX) 75 MG tablet TAKE 1 TABLET BY MOUTH ONCE DAILY  . ferrous sulfate 325 (65 FE) MG tablet Take 325 mg by mouth 2 (two) times daily with a meal.  . gabapentin (NEURONTIN) 600 MG tablet TAKE 1 TABLET BY IN THE MORNING AND 2 ATBEDTIME  . glucose blood (ONE TOUCH ULTRA TEST) test strip USE AS DIRECTED TO CHECK BLOOD SUGAR 3 TIMES DAILY  . hydrALAZINE (APRESOLINE) 25 MG tablet Take 1/2 tablet by mouth 3 times a day.  . insulin aspart (NOVOLOG) 100 UNIT/ML FlexPen Inject 5 Units into the skin 3 (three) times daily with meals.  . Insulin Glargine (LANTUS SOLOSTAR) 100 UNIT/ML Solostar Pen Inject 22 Units into the skin daily.  . Insulin Pen Needle (PEN NEEDLES) 32G X 6 MM MISC 1 each by Does not apply route 4 (four) times daily.  . isosorbide mononitrate (IMDUR) 30 MG 24 hr tablet Take 1 tablet (30 mg total) by mouth daily.  Marland Kitchen levothyroxine (SYNTHROID) 50 MCG tablet Take 1 tablet (50 mcg total) by mouth daily before breakfast.  . losartan (COZAAR) 25 MG tablet Take 0.5 tablets (12.5 mg total) by mouth daily.  . Multiple Vitamin (MULTIVITAMIN) tablet Take 1 tablet by mouth daily.  . Omega-3 Fatty Acids (FISH OIL) 1000 MG CAPS Take 1 capsule by mouth 2 (two) times a day.   . rosuvastatin (CRESTOR) 10 MG tablet Take 1 tablet (10 mg total) by mouth daily.  Marland Kitchen torsemide (DEMADEX) 20 MG tablet Take 80 mg by mouth daily.  . traMADol (ULTRAM) 50 MG tablet TAKE 1 TABLET BY MOUTH EVERY 6 HOURS AS NEEDED   No Known Allergies Past Medical History:  Diagnosis Date  . Acute myocardial infarction, subendocardial infarction, initial episode of care ([place]) 07/21/2012  . Acute osteomyelitis involving ankle and foot ([place]) 02/06/2015  . Acute systolic heart failure ([place]) 07/21/2012   New onset 07/19/12; admission to [facility] ED. Elevated Troponins.  S/p 2D-echo with EF 20-25%.  S/p cardiac  catheterization with stenting LAD.  Repeat 2D-echo 10/2011 with improved EF of 35%.   . Anemia   . Automatic implantable cardioverter-defibrillator in situ    a. MDT CRT-D 06/2014, SN: [id number]  .removed in feb 2020  . CAD (coronary artery disease)    a. cardiac cath 101/04/2012: PCI/DES to chronically occluded mLAD, consideration PCI to diag branch in 4 weeks.   . Cataract   . Chicken pox   . Chronic kidney disease   . Chronic systolic CHF (congestive heart failure) (HCC)    a. mixed ICM & NICM; b. EF 20-25% by echo 07/2012, mid-dist 2/3 of LV sev HK/AK, mild MR. echo 10/2012: EF 30-35%, sev HK ant-septal & inf walls, GR1DD, mild MR, PASP 33. c. echo 02/2013: EF 30%, GR1DD, mild MR. echo 04/2014: EF 30%, Septal-lat dyssynchrony, global HK, inf AK, GR1DD, mild MR. d. echo 10/2014: EF50-55%, WM  nl, GR1DD, septal mild paradox. e. echo 02/2015: EF 50-55%, wm   . Depression   . Heart attack ([place])   . Heel ulcer ([place]) 04/27/2015  . History of blood transfusion ~ 2011   "plasma; had neuropathy; couldn't walk"  . Hypertension   . LBBB (left bundle branch block)   . Neuromuscular disorder ([place])   . Neuropathy 2011  . Obesity, unspecified   . OSA on CPAP    Moderate with AHI 23/hr and now on CPAP at 16cm H2O.  Her DME is [facility]  . Pure hypercholesterolemia   . Sepsis ([place]) 09/2018  . Type II diabetes mellitus ([place])   . Unspecified vitamin D deficiency    Family History  Problem Relation Age of Onset  . Diabetes Mother   . Hypertension Mother   . Arthritis Mother        knees, lumbar DDD, cervical DDD  . Cancer Father        prostate,skin,lymphoma.  . Cancer Brother 76       bladder cancer; non-smoker  . Diabetes Maternal Grandmother   . Heart disease Maternal Grandmother   . COPD Maternal Grandmother   . Diabetes Paternal Grandmother   . Obesity Brother   . Diabetes Son   . Hypertension Son   . Cancer Maternal Grandfather   . Diabetes Paternal Grandfather    Past Surgical History:   Procedure Laterality Date  . AMPUTATION TOE Right 06/18/2015   Procedure: AMPUTATION TOE;  Surgeon: Samara Deist, DPM;  Location: [facility] ORS;  Service: Podiatry;  Laterality: Right;  . AMPUTATION TOE Left 10/08/2018   Procedure: AMPUTATION TOE LEFT 2ND;  Surgeon: Albertine Patricia, DPM;  Location: [facility] ORS;  Service: Podiatry;  Laterality: Left;  . BACK SURGERY    . BI-VENTRICULAR IMPLANTABLE CARDIOVERTER DEFIBRILLATOR N/A 07/06/2014   Procedure: BI-VENTRICULAR IMPLANTABLE CARDIOVERTER DEFIBRILLATOR  (CRT-D);  Surgeon: Deboraha Sprang, MD;  Location: [facility] CATH LAB;  Service: Cardiovascular;  Laterality: N/A;  . BI-VENTRICULAR IMPLANTABLE CARDIOVERTER DEFIBRILLATOR  (CRT-D)  07/06/2014  . BILATERAL OOPHORECTOMY  01/2011   ovarian cyst benign  . BIV ICD INSERTION CRT-D N/A 03/31/2019   Procedure: BIV ICD INSERTION CRT-D;  Surgeon: Constance Haw, MD;  Location: [place] CV LAB;  Service: Cardiovascular;  Laterality: N/A;  . COLONOSCOPY WITH PROPOFOL Left 02/22/2015   Procedure: COLONOSCOPY WITH PROPOFOL;  Surgeon: Hulen Luster, MD;  Location: [facility] ENDOSCOPY;  Service: Endoscopy;  Laterality: Left;  . CORONARY ANGIOPLASTY WITH STENT PLACEMENT Left 07/2012   new onset systolic CHF; elevated troponins.  Cardiac catheterization with stenting to LAD; EF 15%.  2D-echo: EF 20-25%.  . ESOPHAGOGASTRODUODENOSCOPY N/A 02/22/2015   Procedure: ESOPHAGOGASTRODUODENOSCOPY (EGD);  Surgeon: Hulen Luster, MD;  Location: [facility] ENDOSCOPY;  Service: Endoscopy;  Laterality: N/A;  . INCISION AND DRAINAGE ABSCESS Right 2007   groin; with ICU stay due to sepsis.  Marland Kitchen LAPAROSCOPIC CHOLECYSTECTOMY  2011  . LEFT HEART CATH AND CORONARY ANGIOGRAPHY N/A 10/05/2018   Procedure: LEFT HEART CATH AND CORONARY ANGIOGRAPHY;  Surgeon: Minna Merritts, MD;  Location: [place] CV LAB;  Service: Cardiovascular;  Laterality: N/A;  . LEFT HEART CATHETERIZATION WITH CORONARY ANGIOGRAM N/A 07/21/2012   Procedure: LEFT HEART CATHETERIZATION  WITH CORONARY ANGIOGRAM;  Surgeon: Jolaine Artist, MD;  Location: [facility] CATH LAB;  Service: Cardiovascular;  Laterality: N/A;  . [place]   L4-5  . PACEMAKER REMOVAL  11/2018   due to infection around pacemaker  . PERCUTANEOUS CORONARY  STENT INTERVENTION (PCI-S) N/A 07/23/2012   Procedure: PERCUTANEOUS CORONARY STENT INTERVENTION (PCI-S);  Surgeon: Sherren Mocha, MD;  Location: [facility] CATH LAB;  Service: Cardiovascular;  Laterality: N/A;  . PERIPHERAL VASCULAR BALLOON ANGIOPLASTY Left 10/06/2018   Procedure: PERIPHERAL VASCULAR BALLOON ANGIOPLASTY;  Surgeon: Katha Cabal, MD;  Location: [place] CV LAB;  Service: Cardiovascular;  Laterality: Left;  . PERIPHERAL VASCULAR CATHETERIZATION N/A 02/10/2015   Procedure: Picc Line Insertion;  Surgeon: Katha Cabal, MD;  Location: [place] CV LAB;  Service: Cardiovascular;  Laterality: N/A;  . RIGHT HEART CATH N/A 03/29/2019   Procedure: RIGHT HEART CATH;  Surgeon: Jolaine Artist, MD;  Location: [place] CV LAB;  Service: Cardiovascular;  Laterality: N/A;  . TEE WITHOUT CARDIOVERSION N/A 10/02/2018   Procedure: TRANSESOPHAGEAL ECHOCARDIOGRAM (TEE);  Surgeon: Wellington Hampshire, MD;  Location: [facility] ORS;  Service: Cardiovascular;  Laterality: N/A;  . [place]  . VAGINAL HYSTERECTOMY  01/2011   Fibroids/DUB.  Ovaries removed. [place].   Social History   Socioeconomic History  . Marital status: Married    Spouse name: [name]  . Number of children: 2  . Years of education: 28  . Highest education level: High school graduate  Occupational History  . Occupation: disabled    Fish farm manager: UNEMPLOYED    Comment: 03/2010 for peripheral neuropathy  . 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  Tobacco Use  . Smoking status: Never Smoker  . Smokeless tobacco: Never Used  Substance and Sexual Activity  . Alcohol use: No    Alcohol/week: 0.0 standard drinks  . Drug use: No  . Sexual activity: Never     Birth control/protection: Post-menopausal, Surgical  Other Topics Concern  . Not on file  Social History Narrative   Marital status:Married x 45 yrs. Happily married, no abuse.       Children:  2 children (daughter 63, son 32). Two grandsons and 2 step grandchildren.  1 gg.      Lives: with husband, daughter, son-in-law, 2 grandsons, and 1 on the way.      Employment: disability for peripheral neuropathy 2012.  Previously had home daycare.      Tobacco: none      Alcohol: none      Drugs: none      Exercise: none. [employer].      Pets: dog.      Always uses seat belts, smoke detectors in home.      No guns in the home.       Caffeine use: 2 cups coffee per day.       Nutrition: Well balanced diet.   Social Determinants of Health   Financial Resource Strain:   . Difficulty of Paying Living Expenses: Not on file  Food Insecurity:   . Worried About Charity fundraiser in the Last Year: Not on file  . Ran Out of Food in the Last Year: Not on file  Transportation Needs:   . Lack of Transportation (Medical): Not on file  . Lack of Transportation (Non-Medical): Not on file  Physical Activity:   . Days of Exercise per Week: Not on file  . Minutes of Exercise per Session: Not on file  Stress:   . Feeling of Stress : Not on file  Social Connections:   . Frequency of Communication with Friends and Family: Not on file  . Frequency of Social Gatherings with Friends and Family: Not on file  .  Attends Religious Services: Not on file  . Active Member of Clubs or Organizations: Not on file  . Attends Archivist Meetings: Not on file  . Marital Status: Not on file  Intimate Partner Violence:   . Fear of Current or Ex-Partner: Not on file  . Emotionally Abused: Not on file  . Physically Abused: Not on file  . Sexually Abused: Not on file     Lipid Panel     Component Value Date/Time   CHOL 123 09/06/2019 1539   TRIG 149 09/06/2019 1539   HDL 42 09/06/2019 1539    CHOLHDL 2.9 09/06/2019 1539   CHOLHDL 5.6 (H) 08/06/2016 1431   VLDL 34 (H) 08/06/2016 1431   LDLCALC 55 09/06/2019 1539    Physical Exam:  Blood pressure (!) 120/52, pulse (!) 54, weight 110.7 kg (244 lb), SpO2 99 %.  General:  Well appearing. No resp difficulty HEENT: normal Neck: supple. no JVD. Carotids 2+ bilat; no bruits. No lymphadenopathy or thryomegaly appreciated. Cor: PMI nondisplaced. Regular rate & rhythm. No rubs, gallops or murmurs. Lungs: clear Abdomen: obese  soft, nontender, nondistended. No hepatosplenomegaly. No bruits or masses. Good bowel sounds. Extremities: no cyanosis, clubbing, rash, edema Neuro: alert & orientedx3, cranial nerves grossly intact. moves all 4 extremities w/o difficulty. Affect pleasant   EKG  Sinus brady with v-pacing 55 Personally reviewed   ASSESSMENT AND PLAN:   1. Chronic Systolic HF/ Mixed Ischemic/NICM w/ LBBB:  - Echo 6/20 EF 10-15%. - Echo 12/20 EF 35% s/p MDT CRT-D re-implant (initally removed due to sepsis).  - stable NYHA II - Volume status looks good.  - On low-dose hydral/nitrates, b-blocker and losartan.  - Given CKD 4 do not want to drop SBP much below 115-120. Will continue current meds.  - I did discuss SGLT2i with ehr and she will discuss with her Nephrologist as GFR is borderline   2. CKD, Stage IV:  - followed by nephrology (Dr. Rolly [facility]) - Last creatinine was 2.2 on 09/06/2019 - Has f/u this week   3. PAT & NSVT, Frequent PVCs:  - suppressed on amiodarone, 200 mg daily and followed by Dr. Caryl Comes. Denies palpitations.   4. Hypothyroidism:  - on synthroid n  5. CAD:   - s/p prior MI>>LAD DES.  - No s/s angina - On Plavix and statin.   6. OSA:  - compliant w/ CPAP using nasal device and chin strap  7. DM2 - managed by PCP - continue SGLT2i  Glori Bickers, MD  11:40 AM

## 2019-10-28 DIAGNOSIS — N184 Chronic kidney disease, stage 4 (severe): Secondary | ICD-10-CM | POA: Diagnosis not present

## 2019-10-28 DIAGNOSIS — R809 Proteinuria, unspecified: Secondary | ICD-10-CM | POA: Diagnosis not present

## 2019-10-28 DIAGNOSIS — D631 Anemia in chronic kidney disease: Secondary | ICD-10-CM | POA: Diagnosis not present

## 2019-10-28 DIAGNOSIS — R768 Other specified abnormal immunological findings in serum: Secondary | ICD-10-CM | POA: Diagnosis not present

## 2019-10-28 DIAGNOSIS — I129 Hypertensive chronic kidney disease with stage 1 through stage 4 chronic kidney disease, or unspecified chronic kidney disease: Secondary | ICD-10-CM | POA: Diagnosis not present

## 2019-10-28 DIAGNOSIS — E875 Hyperkalemia: Secondary | ICD-10-CM | POA: Diagnosis not present

## 2019-10-28 DIAGNOSIS — E1122 Type 2 diabetes mellitus with diabetic chronic kidney disease: Secondary | ICD-10-CM | POA: Diagnosis not present

## 2019-10-28 DIAGNOSIS — N2581 Secondary hyperparathyroidism of renal origin: Secondary | ICD-10-CM | POA: Diagnosis not present

## 2019-11-01 ENCOUNTER — Telehealth (HOSPITAL_COMMUNITY): Payer: Self-pay

## 2019-11-01 NOTE — Telephone Encounter (Signed)
Today spoke with Beda and she has been doing good.  She has not been in hospital since 03/23/2019.  She has had several good checkups with [facility] HF clinic.  She is watching her diet, fluids and weighing daily.  She has been going to all her appts and she is aware of how and when to take her medications.  She does her own medications boxes.  After discussing with Otila Kluver with HF clinic we are both in agreement to discharge Cherylynn from [place] aware if she has any questions or flairups she can be added back to the program.  She was thankful for the help and she will keep my number.  She appears to understand.  Will discharge Dazani as of today.   [place] [phone]

## 2019-11-04 ENCOUNTER — Other Ambulatory Visit: Payer: PPO | Admitting: Nurse Practitioner

## 2019-11-04 ENCOUNTER — Other Ambulatory Visit: Payer: Self-pay

## 2019-11-04 ENCOUNTER — Encounter: Payer: Self-pay | Admitting: Nurse Practitioner

## 2019-11-04 DIAGNOSIS — I509 Heart failure, unspecified: Secondary | ICD-10-CM | POA: Diagnosis not present

## 2019-11-04 DIAGNOSIS — Z515 Encounter for palliative care: Secondary | ICD-10-CM

## 2019-11-04 NOTE — Progress Notes (Signed)
Cardiology Office Note  Date:  11/05/2019   ID:  ACHSAH MCQUADE, DOB 09/12/1956, MRN [id number]  PCP:  Rutherford Guys, MD   Chief Complaint  Patient presents with  . office visit    6 month F/U; Meds verbally reviewed with patient.    HPI:  [name] is a 64 y.o. female who presents for a follow-up visit regarding   chronic systolic heart failure, previous ejection fraction 20% in 2011 now up to 50% in 2017 Status post Medtr[employer] CRT-D implantation 9/15.  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 Placing a short stent in the distal edge of the previously placed LAD stent was felt to provide little clinical benefit with high risk and would delay her needed toe amputation secondary to osteomyelitis.   medical management was advised.   2D echo at that time showed an EF of 45-50%.  TEE showed no evidence of valve vegetation or wire associated vegetation.   readmitted in 11/2018 with recurrent MSSA bacteremia and was transferred to [facility] where she underwent successful aspiration of TV/lead thrombus and CRT-D/lead extraction.  admitted in 03/2019 with acute on chronic biventricular CHF with EF subsequently reduced again after CRT removal    echo at that time showing an EF of 10 to 15%, mildly dilated LV cavity, severely reduced RV SF with moderately enlarged RV cavity, PASP 45.2 mmHg, mildly dilated left atrium, moderately dilated right atrium, moderate mitral valve regurgitation, moderate to severe tricuspid valve regurgitation, trivial aortic valve insufficiency.    markedly volume overloaded with worsening renal function.   Diuresis was augmented with milrinone.  husband suffered a CVA and passed away.   RHC showed elevated filling pressures leading to continued IV diuresis.  She subsequently underwent reimplantation of CRT-D.   seen by EP for PAT/NSVT and started on amiodarone.   Documented discharge weight of 287 pounds.  She has subsequently been seen by EP and noted to be hypothyroid, placed on replacement therapy.     echo in 09/2019 showed improvement in LV systolic function with an EF of 35% following reimplantation of CRT-D.  Lives with grandson Weight at the [facility] heart failure clinic was 244 pounds Today 246.8 pounds Weight at home 243  Has been taking Torsemide 80 in the AM and 40 mg in the PM Script says on torsemide 80, we will update Not on metolazone  She reports having spasms in her left back which she attributes to her pacer " I had this before, settings were changed and it went away now they are back again"  HAB1C 8.1,  HGB 11.2 Total chol 123, LDL 55 TSH 6.1 CR 2.2, BUN 62  EKG personally reviewed by myself on todays visit Shows paced rhythm rate 61 bpm  Other past medical history reviewed unable to tolerate spironolactone or lisinopril due to hyperkalemia.  hospitalized in May,2016 at [facility] for symptomatic anemia. Hemoglobin was 7.3.    improved with IV iron. Peripheral arterial dopplers 8/15 with normal ABIs bilaterally  Sleep study 11/15 with severe OSA  Lexiscan Cardiolite in 3/16 with EF 50%, small apical infarct with mild peri-infarct ischemia, low risk study.    She  had a small stroke in early 2007 due to suspected small vessel disease.   PMH:   has a past medical history of Acute myocardial infarction, subendocardial infarction, initial episode of care [facility]) (07/21/2012), Acute osteomyelitis involving ankle and foot ([place]) (02/07/2705), Acute systolic heart failure ([place]) (07/21/2012), Anemia, Automatic implantable cardioverter-defibrillator in situ, CAD (coronary artery disease), Cataract, Chicken pox, Chronic kidney disease, Chronic systolic CHF (congestive heart failure) ([place]), Depression, Heart attack ([place]), Heel ulcer ([place]) (04/27/2015), History of blood transfusion (~ 2011), Hypertension, LBBB (left bundle branch block), Neuromuscular disorder ([place]), Neuropathy (2011), Obesity, unspecified, OSA on CPAP, Pure hypercholesterolemia, Sepsis ([place]) (09/2018), Type II diabetes mellitus ([place]), and Unspecified vitamin D deficiency.  PSH:    Past Surgical History:  Procedure Laterality Date  . AMPUTATION TOE Right 06/18/2015   Procedure: AMPUTATION TOE;  Surgeon: Samara Deist, DPM;  Location: [facility] ORS;  Service: Podiatry;  Laterality: Right;  . AMPUTATION TOE Left 10/08/2018   Procedure: AMPUTATION TOE LEFT 2ND;  Surgeon: Albertine Patricia, DPM;  Location: [facility] ORS;  Service: Podiatry;  Laterality: Left;  . BACK SURGERY    . BI-VENTRICULAR IMPLANTABLE CARDIOVERTER DEFIBRILLATOR N/A 07/06/2014   Procedure: BI-VENTRICULAR IMPLANTABLE CARDIOVERTER DEFIBRILLATOR  (CRT-D);  Surgeon: Deboraha Sprang, MD;  Location[facility] CATH LAB;  Service: Cardiovascular;  Laterality: N/A;  . BI-VENTRICULAR IMPLANTABLE CARDIOVERTER DEFIBRILLATOR  (CRT-D)  07/06/2014  . BILATERAL OOPHORECTOMY  01/2011   ovarian cyst benign  . BIV ICD INSERTION CRT-D N/A 03/31/2019   Procedure: BIV ICD INSERTION CRT-D;  Surgeon: Constance Haw, MD;  Location: [place] CV LAB;  Service: Cardiovascular;  Laterality: N/A;  . COLONOSCOPY WITH PROPOFOL Left 02/22/2015   Procedure: COLONOSCOPY WITH PROPOFOL;   Surgeon: Hulen Luster, MD;  Location: [facility] ENDOSCOPY;  Service: Endoscopy;  Laterality: Left;  . CORONARY ANGIOPLASTY WITH STENT PLACEMENT Left 07/2012   new onset systolic CHF; elevated troponins.  Cardiac catheterization with stenting to LAD; EF 15%.  2D-echo: EF 20-25%.  . ESOPHAGOGASTRODUODENOSCOPY N/A 02/22/2015   Procedure: ESOPHAGOGASTRODUODENOSCOPY (EGD);  Surgeon: Hulen Luster, MD;  Location: [facility] ENDOSCOPY;  Service: Endoscopy;  Laterality: N/A;  . INCISION AND DRAINAGE ABSCESS Right 2007   groin; with ICU stay due to sepsis.  Marland Kitchen LAPAROSCOPIC CHOLECYSTECTOMY  2011  . LEFT HEART CATH AND CORONARY ANGIOGRAPHY N/A 10/05/2018   Procedure: LEFT HEART CATH AND CORONARY ANGIOGRAPHY;  Surgeon: Minna Merritts, MD;  Location: [place] CV LAB;  Service: Cardiovascular;  Laterality: N/A;  . LEFT HEART CATHETERIZATION WITH CORONARY ANGIOGRAM N/A 07/21/2012   Procedure: LEFT HEART CATHETERIZATION WITH CORONARY ANGIOGRAM;  Surgeon: Jolaine Artist, MD;  Location: [facility] CATH LAB;  Service: Cardiovascular;  Laterality: N/A;  . [place]   L4-5  . PACEMAKER REMOVAL  11/2018   due to infection around pacemaker  . PERCUTANEOUS CORONARY STENT INTERVENTION (PCI-S) N/A 07/23/2012   Procedure: PERCUTANEOUS CORONARY STENT INTERVENTION (PCI-S);  Surgeon: Sherren Mocha, MD;  Location: [facility] CATH LAB;  Service: Cardiovascular;  Laterality: N/A;  . PERIPHERAL VASCULAR BALLOON ANGIOPLASTY Left 10/06/2018   Procedure: PERIPHERAL VASCULAR BALLOON  ANGIOPLASTY;  Surgeon: Katha Cabal, MD;  Location: [place] CV LAB;  Service: Cardiovascular;  Laterality: Left;  . PERIPHERAL VASCULAR CATHETERIZATION N/A 02/10/2015   Procedure: Picc Line Insertion;  Surgeon: Katha Cabal, MD;  Location: [place] CV LAB;  Service: Cardiovascular;  Laterality: N/A;  . RIGHT HEART CATH N/A 03/29/2019   Procedure: RIGHT HEART CATH;  Surgeon: Jolaine Artist, MD;  Location: [place] CV LAB;  Service:  Cardiovascular;  Laterality: N/A;  . TEE WITHOUT CARDIOVERSION N/A 10/02/2018   Procedure: TRANSESOPHAGEAL ECHOCARDIOGRAM (TEE);  Surgeon: Wellington Hampshire, MD;  Location: [facility] ORS;  Service: Cardiovascular;  Laterality: N/A;  . [place]  . VAGINAL HYSTERECTOMY  01/2011   Fibroids/DUB.  Ovaries removed. [place].    Current Outpatient Medications  Medication Sig Dispense Refill  . amiodarone (PACERONE) 200 MG tablet Take 200 mg by mouth daily.    . Calcium Carbonate-Vitamin D3 (CALCIUM 600-D) 600-400 MG-UNIT TABS Take 1 tablet by mouth 2 (two) times a day.    . carvedilol (COREG) 3.125 MG tablet Take 1 tablet (3.125 mg total) by mouth 2 (two) times daily with a meal. 180 tablet 3  . clopidogrel (PLAVIX) 75 MG tablet TAKE 1 TABLET BY MOUTH ONCE DAILY 90 tablet 0  . ferrous sulfate 325 (65 FE) MG tablet Take 325 mg by mouth 2 (two) times daily with a meal.    . gabapentin (NEURONTIN) 600 MG tablet TAKE 1 TABLET BY IN THE MORNING AND 2 ATBEDTIME 270 tablet 0  . glucose blood (ONE TOUCH ULTRA TEST) test strip USE AS DIRECTED TO CHECK BLOOD SUGAR 3 TIMES DAILY 300 each 11  . hydrALAZINE (APRESOLINE) 25 MG tablet Take 1/2 tablet by mouth 3 times a day. 90 tablet 2  . insulin aspart (NOVOLOG) 100 UNIT/ML FlexPen Inject 5 Units into the skin 3 (three) times daily with meals. 15 mL 11  . Insulin Glargine (LANTUS SOLOSTAR) 100 UNIT/ML Solostar Pen Inject 22 Units into the skin daily. 5 pen 11  . Insulin Pen Needle (PEN NEEDLES) 32G X 6 MM MISC 1 each by Does not apply route 4 (four) times daily. 150 each 11  . isosorbide mononitrate (IMDUR) 30 MG 24 hr tablet Take 1 tablet (30 mg total) by mouth daily. 30 tablet 11  . levothyroxine (SYNTHROID) 50 MCG tablet Take 1 tablet (50 mcg total) by mouth daily before breakfast. 30 tablet 3  . Multiple Vitamin (MULTIVITAMIN) tablet Take 1 tablet by mouth daily.    . Omega-3 Fatty Acids (FISH OIL) 1000 MG CAPS Take 1 capsule by mouth 2 (two) times a  day.     . rosuvastatin (CRESTOR) 10 MG tablet Take 1 tablet (10 mg total) by mouth daily. 90 tablet 3  . torsemide (DEMADEX) 20 MG tablet Take 80 mg by mouth daily.    . traMADol (ULTRAM) 50 MG tablet TAKE 1 TABLET BY MOUTH EVERY 6 HOURS AS NEEDED 120 tablet 0  . losartan (COZAAR) 25 MG tablet Take 0.5 tablets (12.5 mg total) by mouth daily. 45 tablet 3   No current facility-administered medications for this visit.     Allergies:   Patient has no known allergies.   Social History:  The patient  reports that she has never smoked. She has never used smokeless tobacco. She reports that she does not drink alcohol or use drugs.   Family History:   family history includes Arthritis in her mother; COPD in her maternal grandmother; Cancer in  her father and maternal grandfather; Cancer (age of onset: 24) in her brother; Diabetes in her maternal grandmother, mother, paternal grandfather, paternal grandmother, and son; Heart disease in her maternal grandmother; Hypertension in her mother and son; Obesity in her brother.    Review of Systems  Constitutional: Negative.   HENT: Negative.   Respiratory: Negative.   Cardiovascular: Negative.   Gastrointestinal: Negative.   Musculoskeletal: Positive for back pain.  Neurological: Negative.   Psychiatric/Behavioral: Negative.   All other systems reviewed and are negative.   PHYSICAL EXAM: VS:  BP (!) 100/50 (BP Location: Right Arm, Patient Position: Sitting, Cuff Size: Normal)   Pulse 61   Ht 5\' 8"  (1.727 m)   Wt 246 lb 8 oz (111.8 kg)   SpO2 97%   BMI 37.48 kg/m  , BMI Body mass index is 37.48 kg/m. Constitutional:  oriented to person, place, and time. No distress.  HENT:  Head: Grossly normal Eyes:  no discharge. No scleral icterus.  Neck: No JVD, no carotid bruits  Cardiovascular: Regular rate and rhythm, no murmurs appreciated Pulmonary/Chest: Clear to auscultation bilaterally, no wheezes or rails Abdominal: Soft.  no distension.  no  tenderness.  Musculoskeletal: Normal range of motion Neurological:  normal muscle tone. Coordination normal. No atrophy Skin: Skin warm and dry Psychiatric: normal affect, pleasant  Recent Labs: 03/30/2019: Magnesium 2.2 04/14/2019: B Natriuretic Peptide 1,604.1 09/06/2019: ALT 23; BUN 62; Creatinine, Ser 2.21; Hemoglobin 11.2; Platelets 196; Potassium 4.7; Sodium 140; TSH 6.170    Lipid Panel Lab Results  Component Value Date   CHOL 123 09/06/2019   HDL 42 09/06/2019   LDLCALC 55 09/06/2019   TRIG 149 09/06/2019      Wt Readings from Last 3 Encounters:  11/05/19 246 lb 8 oz (111.8 kg)  10/25/19 244 lb (110.7 kg)  09/14/19 238 lb 4 oz (108.1 kg)     ASSESSMENT AND PLAN:  Dilated cardiomyopathy ([place]) - Plan: EKG 12-Lead  ejection fraction 50% in 2017 Most recent echocardiogram December 2020 ejection fraction 35% She reports that she feels euvolemic, Taking torsemide 80 in the morning 40 mg in the evening We will check a BMP today given slow trend upwards over the past several months Appears goal weight 243 pounds Given underlying renal dysfunction is not on Entresto or spironolactone  Coronary artery disease of native artery of native heart with stable angina pectoris ([place]) - Plan: EKG 12-Lead Prior cardiac catheterization results reviewed with her, medical management Cholesterol at goal Stressed importance of aggressive diabetes control, discussed her diet with her.  Grandson that lives with her likes [employer]  Hyperlipidemia, unspecified hyperlipidemia type - Continue Crestor 10 mg daily, Will continue to monitor lipids closely Goal LDL less than 70 preferably 60 or less  Chronic systolic heart failure (HCC) Euvolemic, will check BMP as detailed above No medication changes at this time Baseline weight appears 243  Diabetic peripheral neuropathy associated with type 2 diabetes mellitus ([place]) HBA1C 8 Diabetes diet discussed with her  History of CVA  (cerebrovascular accident)  currently on Plavix  ICD Followed by Dr. Caryl Comes She is having pulsations in her left lower back which she feels is consistent with previous symptoms from her device Likely having phrenic nerve stimulation, diaphragm spasm Discussed with nurse in the office, they will arrange interrogation with device clinic   Disposition:   F/U  2 months   Total encounter time more than 45 minutes  Greater than 50% was spent in counseling and coordination of care  with the patient   No orders of the defined types were placed in this encounter.    Signed, Esmond Plants, M.D., Ph.D. 11/05/2019  [place], [place]

## 2019-11-04 NOTE — Progress Notes (Signed)
Therapist, nutritional Palliative Care Consult Note Telephone: [phone]  Fax: [phone]  PATIENT NAME: [name] DOB: Dec 20, 1955 MRN: [id number]  PRIMARY CARE PROVIDER:   Myles Lipps, MD  REFERRING PROVIDER:  Myles Lipps, MD [address] [place],  [place] [address]  RESPONSIBLE PARTY:Self  Due to the COVID-19 crisis, this visit was done via telemedicine from my office and it was initiated and consent by this patient and or family.  RECOMMENDATIONS and PLAN: 1.ACP: DNR; Treat what is treatable  2.Dyspneic R06.00 secondary to remain stable at present time. Continue daily weights  3. Palliative care encounter Z51.5; Palliative medicine team will continue to support patient, patient's family, and medical team. Visit consisted of counseling and education dealing with the complex and emotionally intense issues of symptom management and palliative care in the setting of serious and potentially life-threatening illness  I spent 33 minutes providing this consultation,  from 10:00am to 10:33am. More than 50% of the time in this consultation was spent coordinating communication.   HISTORY OF PRESENT ILLNESS:  [name] is a 63 y.o. year old female with multiple medical problems including Late onset CVA, Systolic congestive heart failure, coronary artery disease, automatic implantable cardioverter-defibrillator placed, myocardial infarction, left bundle branch block, his shaved endocarditis, obstructive sleep apnea, diabetes, hyperlipidemia, lymphedema, hypertension, obesity, neuropathy, chronic kidney disease, iron deficiency anemia, depression, history of osteomyelitis ankle/foot 2013, cataracts, neuropathy, vaginal hysterectomy with oophorectomy, tubal ligation, TEE without cardioversion, right, left toe amputation, lumbar disc surgery, cholecystectomy. I called [name] and [name] returned my call. We talked about purpose of  palliative care visit. [name] in agreement. [name] endorses that she has been feeling great. No problems with symptoms of pain, shortness of breath, weakness, tired, fatigued. [name] has significantly improved, no recent falls, hospitalizations, wounds come infections. [name] currently resides independently by herself. [name] denies any concerns or complaints. We talked about medical goals of care and wishes to treat what is treatable but continued DNR. We talked about role of palliative care and plan of care. Discuss that will follow up in two months if needed or sooner should she did decline. [name] in agreement, appointment scheduled. Therapeutic listening and emotional support provided. Contact information. Questions answered to satisfaction.  Palliative Care was asked to help to continue to address goals of care.   CODE STATUS: DNR  PPS: 60% HOSPICE ELIGIBILITY/DIAGNOSIS: TBD  PAST MEDICAL HISTORY:  Past Medical History:  Diagnosis Date  . Acute myocardial infarction, subendocardial infarction, initial episode of care (HCC) 07/21/2012  . Acute osteomyelitis involving ankle and foot (HCC) 02/06/2015  . Acute systolic heart failure (HCC) 07/21/2012   New onset 07/19/12; admission to [facility] ED. Elevated Troponins.  S/p 2D-echo with EF 20-25%.  S/p cardiac catheterization with stenting LAD.  Repeat 2D-echo 10/2011 with improved EF of 35%.   . Anemia   . Automatic implantable cardioverter-defibrillator in situ    a. MDT CRT-D 06/2014, SN: [id number]  .removed in feb 2020  . CAD (coronary artery disease)    a. cardiac cath 101/04/2012: PCI/DES to chronically occluded mLAD, consideration PCI to diag branch in 4 weeks.   . Cataract   . Chicken pox   . Chronic kidney disease   . Chronic systolic CHF (congestive heart failure) (HCC)    a. mixed ICM & NICM; b. EF 20-25% by echo 07/2012, mid-dist 2/3 of LV sev HK/AK, mild MR. echo 10/2012: EF 30-35%, sev  HK  ant-septal & inf walls, GR1DD, mild MR, PASP 33. c. echo 02/2013: EF 30%, GR1DD, mild MR. echo 04/2014: EF 30%, Septal-lat dyssynchrony, global HK, inf AK, GR1DD, mild MR. d. echo 10/2014: EF50-55%, WM nl, GR1DD, septal mild paradox. e. echo 02/2015: EF 50-55%, wm   . Depression   . Heart attack (HCC)   . Heel ulcer (HCC) 04/27/2015  . History of blood transfusion ~ 2011   "plasma; had neuropathy; couldn't walk"  . Hypertension   . LBBB (left bundle branch block)   . Neuromuscular disorder (HCC)   . Neuropathy 2011  . Obesity, unspecified   . OSA on CPAP    Moderate with AHI 23/hr and now on CPAP at 16cm H2O.  Her DME is [facility]  . Pure hypercholesterolemia   . Sepsis (HCC) 09/2018  . Type II diabetes mellitus (HCC)   . Unspecified vitamin D deficiency     SOCIAL HX:  Social History   Tobacco Use  . Smoking status: Never Smoker  . Smokeless tobacco: Never Used  Substance Use Topics  . Alcohol use: No    Alcohol/week: 0.0 standard drinks    ALLERGIES: No Known Allergies   PERTINENT MEDICATIONS:  Outpatient Encounter Medications as of 11/04/2019  Medication Sig  . amiodarone (PACERONE) 200 MG tablet Take 200 mg by mouth daily.  . Calcium Carbonate-Vitamin D3 (CALCIUM 600-D) 600-400 MG-UNIT TABS Take 1 tablet by mouth 2 (two) times a day.  . carvedilol (COREG) 3.125 MG tablet Take 1 tablet (3.125 mg total) by mouth 2 (two) times daily with a meal.  . clopidogrel (PLAVIX) 75 MG tablet TAKE 1 TABLET BY MOUTH ONCE DAILY  . ferrous sulfate 325 (65 FE) MG tablet Take 325 mg by mouth 2 (two) times daily with a meal.  . gabapentin (NEURONTIN) 600 MG tablet TAKE 1 TABLET BY IN THE MORNING AND 2 ATBEDTIME  . glucose blood (ONE TOUCH ULTRA TEST) test strip USE AS DIRECTED TO CHECK BLOOD SUGAR 3 TIMES DAILY  . hydrALAZINE (APRESOLINE) 25 MG tablet Take 1/2 tablet by mouth 3 times a day.  . insulin aspart (NOVOLOG) 100 UNIT/ML FlexPen Inject 5 Units into the skin 3 (three) times daily with meals.  .  Insulin Glargine (LANTUS SOLOSTAR) 100 UNIT/ML Solostar Pen Inject 22 Units into the skin daily.  . Insulin Pen Needle (PEN NEEDLES) 32G X 6 MM MISC 1 each by Does not apply route 4 (four) times daily.  . isosorbide mononitrate (IMDUR) 30 MG 24 hr tablet Take 1 tablet (30 mg total) by mouth daily.  Marland Kitchen levothyroxine (SYNTHROID) 50 MCG tablet Take 1 tablet (50 mcg total) by mouth daily before breakfast.  . losartan (COZAAR) 25 MG tablet Take 0.5 tablets (12.5 mg total) by mouth daily.  . Multiple Vitamin (MULTIVITAMIN) tablet Take 1 tablet by mouth daily.  . Omega-3 Fatty Acids (FISH OIL) 1000 MG CAPS Take 1 capsule by mouth 2 (two) times a day.   . rosuvastatin (CRESTOR) 10 MG tablet Take 1 tablet (10 mg total) by mouth daily.  Marland Kitchen torsemide (DEMADEX) 20 MG tablet Take 80 mg by mouth daily.  . traMADol (ULTRAM) 50 MG tablet TAKE 1 TABLET BY MOUTH EVERY 6 HOURS AS NEEDED   No facility-administered encounter medications on file as of 11/04/2019.    PHYSICAL EXAM:  deferred Katherina Wimer [place], NP

## 2019-11-05 ENCOUNTER — Other Ambulatory Visit
Admission: RE | Admit: 2019-11-05 | Discharge: 2019-11-05 | Disposition: A | Discharge status: Home / Self Care | Payer: PPO | Source: Ambulatory Visit | Attending: Cardiovascular Disease | Admitting: Cardiovascular Disease

## 2019-11-05 ENCOUNTER — Other Ambulatory Visit: Payer: Self-pay

## 2019-11-05 ENCOUNTER — Telehealth: Payer: Self-pay | Admitting: *Deleted

## 2019-11-05 ENCOUNTER — Ambulatory Visit (INDEPENDENT_AMBULATORY_CARE_PROVIDER_SITE_OTHER): Payer: PPO | Admitting: Cardiovascular Disease

## 2019-11-05 ENCOUNTER — Encounter: Payer: Self-pay | Admitting: Cardiovascular Disease

## 2019-11-05 VITALS — BP 100/50 | HR 61 | Ht 68.0 in | Wt 246.5 lb

## 2019-11-05 DIAGNOSIS — E7800 Pure hypercholesterolemia, unspecified: Secondary | ICD-10-CM

## 2019-11-05 DIAGNOSIS — I2510 Atherosclerotic heart disease of native coronary artery without angina pectoris: Secondary | ICD-10-CM | POA: Diagnosis not present

## 2019-11-05 DIAGNOSIS — Z95810 Presence of automatic (implantable) cardiac defibrillator: Secondary | ICD-10-CM

## 2019-11-05 DIAGNOSIS — I428 Other cardiomyopathies: Secondary | ICD-10-CM | POA: Diagnosis not present

## 2019-11-05 DIAGNOSIS — I5023 Acute on chronic systolic (congestive) heart failure: Secondary | ICD-10-CM | POA: Diagnosis not present

## 2019-11-05 DIAGNOSIS — I480 Paroxysmal atrial fibrillation: Secondary | ICD-10-CM | POA: Diagnosis not present

## 2019-11-05 DIAGNOSIS — I330 Acute and subacute infective endocarditis: Secondary | ICD-10-CM

## 2019-11-05 LAB — BASIC METABOLIC PANEL
Anion gap: 14 (ref 5–15)
BUN: 81 mg/dL — ABNORMAL HIGH (ref 8–23)
CO2: 29 mmol/L (ref 22–32)
Calcium: 9 mg/dL (ref 8.9–10.3)
Chloride: 97 mmol/L — ABNORMAL LOW (ref 98–111)
Creatinine, Ser: 2.44 mg/dL — ABNORMAL HIGH (ref 0.44–1.00)
GFR calc Af Amer: 23 mL/min — ABNORMAL LOW (ref >60)
GFR calc non Af Amer: 20 mL/min — ABNORMAL LOW (ref >60)
Glucose, Bld: 346 mg/dL — ABNORMAL HIGH (ref 70–99)
Potassium: 4.9 mmol/L (ref 3.5–5.1)
Sodium: 140 mmol/L (ref 135–145)

## 2019-11-05 MED ORDER — TORSEMIDE 20 MG PO TABS: ORAL_TABLET | ORAL | 2 refills | Status: DC

## 2019-11-05 NOTE — Telephone Encounter (Signed)
-----   Message from  Filbert, RN sent at 11/05/2019 12:38 PM EST ----- Hello! Dr. Rockey Situ saw this patient in clinic this morning and said the patient is reporting more diaphragmatic stim. Would you be able to reach out to her to discuss please! He said he put his hand on her belly and could feel it.   Thanks!

## 2019-11-05 NOTE — Telephone Encounter (Signed)
Spoke with patient. PNS noted in certain positions beginning ~2 weeks ago, does not occur all the time. Previously reprogrammed LV vector to LV4-LV1 from LV1-LV4 at 06/2019 OV with improvement in stim. Patient reports she is able to tolerate stim by changing positions. She is not able to get to [facility] easily. Patient agreeable to appointment on 11/11/19 at 9:00am with Dr. Caryl Comes in [place]. Will arrange [employer] rep to be present. Patient appreciative of call and appointment. No additional questions at this time.

## 2019-11-05 NOTE — Patient Instructions (Addendum)
Medication Instructions:  No changes  If you need a refill on your cardiac medications before your next appointment, please call your pharmacy.    Lab work: Your physician recommends that you return for lab work today at the medical mall. ([facility]) No appt is needed. Hours are M-F 7AM- 6 PM.    If you have labs (blood work) drawn today and your tests are completely normal, you will receive your results only by: Marland Kitchen MyChart Message (if you have MyChart) OR . A paper copy in the mail If you have any lab test that is abnormal or we need to change your treatment, we will call you to review the results.   Testing/Procedures: No new testing needed   Follow-Up: At [facility], you and your health needs are our priority.  As part of our continuing mission to provide you with exceptional heart care, we have created designated Provider Care Teams.  These Care Teams include your primary Cardiologist (physician) and Advanced Practice Providers (APPs -  Physician Assistants and Nurse Practitioners) who all work together to provide you with the care you need, when you need it.  . You will need a follow up appointment in 2 months .  Marland Kitchen Providers on your designated Care Team:   . Murray Hodgkins, NP . Christell Faith, PA-C . Marrianne Mood, PA-C  Any Other Special Instructions Will Be Listed Below (If Applicable).  For educational health videos Log in to : [email or web address] Or : [email or web address], password : [email or web address]

## 2019-11-11 ENCOUNTER — Ambulatory Visit (INDEPENDENT_AMBULATORY_CARE_PROVIDER_SITE_OTHER): Payer: PPO | Admitting: Internal Medicine

## 2019-11-11 ENCOUNTER — Encounter: Payer: Self-pay | Admitting: Internal Medicine

## 2019-11-11 ENCOUNTER — Other Ambulatory Visit: Payer: Self-pay

## 2019-11-11 VITALS — BP 98/42 | HR 59 | Ht 68.0 in | Wt 246.2 lb

## 2019-11-11 DIAGNOSIS — I493 Ventricular premature depolarization: Secondary | ICD-10-CM | POA: Diagnosis not present

## 2019-11-11 DIAGNOSIS — Z95810 Presence of automatic (implantable) cardiac defibrillator: Secondary | ICD-10-CM

## 2019-11-11 DIAGNOSIS — I5022 Chronic systolic (congestive) heart failure: Secondary | ICD-10-CM

## 2019-11-11 DIAGNOSIS — I428 Other cardiomyopathies: Secondary | ICD-10-CM | POA: Diagnosis not present

## 2019-11-11 NOTE — Patient Instructions (Signed)
Medication Instructions:  - Your physician recommends that you continue on your current medications as directed. Please refer to the Current Medication list given to you today.  *If you need a refill on your cardiac medications before your next appointment, please call your pharmacy*  Lab Work: - none ordered  If you have labs (blood work) drawn today and your tests are completely normal, you will receive your results only by: Marland Kitchen MyChart Message (if you have MyChart) OR . A paper copy in the mail If you have any lab test that is abnormal or we need to change your treatment, we will call you to review the results.  Testing/Procedures: - none ordered  Follow-Up: At [facility], you and your health needs are our priority.  As part of our continuing mission to provide you with exceptional heart care, we have created designated Provider Care Teams.  These Care Teams include your primary Cardiologist (physician) and Advanced Practice Providers (APPs -  Physician Assistants and Nurse Practitioners) who all work together to provide you with the care you need, when you need it.  Your next appointment:   As scheduled   The format for your next appointment:   In Person  Provider:   Virl Axe, MD  Other Instructions n/a

## 2019-11-11 NOTE — Progress Notes (Signed)
Patient Care Team: Rutherford Guys, MD as PCP - General (Family Medicine) Minna Merritts, MD as PCP - Cardiology (Cardiology) Deboraha Sprang, MD as PCP - Electrophysiology (Cardiology) Bensimhon, Shaune Pascal, MD as PCP - Advanced Heart Failure (Cardiology) Calvert Cantor, MD as Consulting Physician (Ophthalmology) Lavonia Dana, MD as Consulting Physician (Internal Medicine) Elvina Mattes, Adele Schilder as Attending Physician (Podiatry) Tsosie Billing, MD as Consulting Physician (Infectious Diseases)   HPI  [name] is a 64 y.o. female Seen in follow-up for pocket hematoma related to a CRT-D re implantation 03/31/2019 [facility]) .  She had undergone extraction 3/20 2/2 recurrent MSSA bacteremia at [facility]  Nonischemic cardiomyopathy and ventricular ectopy.  Patient had initial near normalization of LV systolic function following CRT with significant loss of LV function with extraction, although EM reminded me that her husband dies intercurrently as well (? [place])    She is here 2/2 diaphragmatic stimulation started about 3 weeks ago No trauma  Functional status continues to improve, min sob , no edema or chest pain, no other palps    DATE TEST EF   9/15 echo 30 %   1 /17 echo 50 %   12/19 Echo 45-50%   12/19 TEE  No vegetations  12/19 LHC  pLAD80 with ISR; mLAD70 pCx 80;D1 90 mRCA 40  6/20 Echo  10-15%   12/20 Echo 35%          Date Cr K Hgb TSH LFTs  3./20 3.54>>1.18  11 4.64  18  7/20 2.15 4.9 9.7 (6/20)17.4    18  9/20  2.02 4.3  21.4   11/20 2.17 4.7 11.2 6.17 23  1/21 2.44 4.9         Records and Results Reviewed   Past Medical History:  Diagnosis Date  . Acute myocardial infarction, subendocardial infarction, initial episode of care ([place]) 07/21/2012  . Acute osteomyelitis involving ankle and foot ([place]) 02/06/2015  . Acute systolic heart failure ([place]) 07/21/2012   New onset 07/19/12; admission to [facility] ED. Elevated  Troponins.  S/p 2D-echo with EF 20-25%.  S/p cardiac catheterization with stenting LAD.  Repeat 2D-echo 10/2011 with improved EF of 35%.   . Anemia   . Automatic implantable cardioverter-defibrillator in situ    a. MDT CRT-D 06/2014, SN: [id number]  .removed in feb 2020  . CAD (coronary artery disease)    a. cardiac cath 101/04/2012: PCI/DES to chronically occluded mLAD, consideration PCI to diag branch in 4 weeks.   . Cataract   . Chicken pox   . Chronic kidney disease   . Chronic systolic CHF (congestive heart failure) (HCC)    a. mixed ICM & NICM; b. EF 20-25% by echo 07/2012, mid-dist 2/3 of LV sev HK/AK, mild MR. echo 10/2012: EF 30-35%, sev HK ant-septal & inf walls, GR1DD, mild MR, PASP 33. c. echo 02/2013: EF 30%, GR1DD, mild MR. echo 04/2014: EF 30%, Septal-lat dyssynchrony, global HK, inf AK, GR1DD, mild MR. d. echo 10/2014: EF50-55%, WM nl, GR1DD, septal mild paradox. e. echo 02/2015: EF 50-55%, wm   . Depression   . Heart attack ([place])   . Heel ulcer ([place]) 04/27/2015  . History of blood transfusion ~ 2011   "plasma; had neuropathy; couldn't walk"  . Hypertension   . LBBB (left bundle branch block)   . Neuromuscular disorder ([place])   . Neuropathy 2011  . Obesity, unspecified   . OSA on CPAP    Moderate with AHI  23/hr and now on CPAP at 16cm H2O.  Her DME is [facility]  . Pure hypercholesterolemia   . Sepsis ([place]) 09/2018  . Type II diabetes mellitus ([place])   . Unspecified vitamin D deficiency     Past Surgical History:  Procedure Laterality Date  . AMPUTATION TOE Right 06/18/2015   Procedure: AMPUTATION TOE;  Surgeon: Samara Deist, DPM;  Location: [facility] ORS;  Service: Podiatry;  Laterality: Right;  . AMPUTATION TOE Left 10/08/2018   Procedure: AMPUTATION TOE LEFT 2ND;  Surgeon: Albertine Patricia, DPM;  Location: [facility] ORS;  Service: Podiatry;  Laterality: Left;  . BACK SURGERY    . BI-VENTRICULAR IMPLANTABLE CARDIOVERTER DEFIBRILLATOR N/A 07/06/2014   Procedure: BI-VENTRICULAR IMPLANTABLE  CARDIOVERTER DEFIBRILLATOR  (CRT-D);  Surgeon: Deboraha Sprang, MD;  Location: [facility] CATH LAB;  Service: Cardiovascular;  Laterality: N/A;  . BI-VENTRICULAR IMPLANTABLE CARDIOVERTER DEFIBRILLATOR  (CRT-D)  07/06/2014  . BILATERAL OOPHORECTOMY  01/2011   ovarian cyst benign  . BIV ICD INSERTION CRT-D N/A 03/31/2019   Procedure: BIV ICD INSERTION CRT-D;  Surgeon: Constance Haw, MD;  Location: [place] CV LAB;  Service: Cardiovascular;  Laterality: N/A;  . COLONOSCOPY WITH PROPOFOL Left 02/22/2015   Procedure: COLONOSCOPY WITH PROPOFOL;  Surgeon: Hulen Luster, MD;  Location: [facility] ENDOSCOPY;  Service: Endoscopy;  Laterality: Left;  . CORONARY ANGIOPLASTY WITH STENT PLACEMENT Left 07/2012   new onset systolic CHF; elevated troponins.  Cardiac catheterization with stenting to LAD; EF 15%.  2D-echo: EF 20-25%.  . ESOPHAGOGASTRODUODENOSCOPY N/A 02/22/2015   Procedure: ESOPHAGOGASTRODUODENOSCOPY (EGD);  Surgeon: Hulen Luster, MD;  Location: [facility] ENDOSCOPY;  Service: Endoscopy;  Laterality: N/A;  . INCISION AND DRAINAGE ABSCESS Right 2007   groin; with ICU stay due to sepsis.  Marland Kitchen LAPAROSCOPIC CHOLECYSTECTOMY  2011  . LEFT HEART CATH AND CORONARY ANGIOGRAPHY N/A 10/05/2018   Procedure: LEFT HEART CATH AND CORONARY ANGIOGRAPHY;  Surgeon: Minna Merritts, MD;  Location: [place] CV LAB;  Service: Cardiovascular;  Laterality: N/A;  . LEFT HEART CATHETERIZATION WITH CORONARY ANGIOGRAM N/A 07/21/2012   Procedure: LEFT HEART CATHETERIZATION WITH CORONARY ANGIOGRAM;  Surgeon: Jolaine Artist, MD;  Location: [facility] CATH LAB;  Service: Cardiovascular;  Laterality: N/A;  . [place]   L4-5  . PACEMAKER REMOVAL  11/2018   due to infection around pacemaker  . PERCUTANEOUS CORONARY STENT INTERVENTION (PCI-S) N/A 07/23/2012   Procedure: PERCUTANEOUS CORONARY STENT INTERVENTION (PCI-S);  Surgeon: Sherren Mocha, MD;  Location: [facility] CATH LAB;  Service: Cardiovascular;  Laterality: N/A;  . PERIPHERAL  VASCULAR BALLOON ANGIOPLASTY Left 10/06/2018   Procedure: PERIPHERAL VASCULAR BALLOON ANGIOPLASTY;  Surgeon: Katha Cabal, MD;  Location: [place] CV LAB;  Service: Cardiovascular;  Laterality: Left;  . PERIPHERAL VASCULAR CATHETERIZATION N/A 02/10/2015   Procedure: Picc Line Insertion;  Surgeon: Katha Cabal, MD;  Location: [place] CV LAB;  Service: Cardiovascular;  Laterality: N/A;  . RIGHT HEART CATH N/A 03/29/2019   Procedure: RIGHT HEART CATH;  Surgeon: Jolaine Artist, MD;  Location: [place] CV LAB;  Service: Cardiovascular;  Laterality: N/A;  . TEE WITHOUT CARDIOVERSION N/A 10/02/2018   Procedure: TRANSESOPHAGEAL ECHOCARDIOGRAM (TEE);  Surgeon: Wellington Hampshire, MD;  Location: [facility] ORS;  Service: Cardiovascular;  Laterality: N/A;  . [place]  . VAGINAL HYSTERECTOMY  01/2011   Fibroids/DUB.  Ovaries removed. [place].    Current Meds  Medication Sig  . amiodarone (PACERONE) 200 MG tablet Take 200 mg by mouth daily.  Marland Kitchen  Calcium Carbonate-Vitamin D3 (CALCIUM 600-D) 600-400 MG-UNIT TABS Take 1 tablet by mouth 2 (two) times a day.  . carvedilol (COREG) 3.125 MG tablet Take 1 tablet (3.125 mg total) by mouth 2 (two) times daily with a meal.  . clopidogrel (PLAVIX) 75 MG tablet TAKE 1 TABLET BY MOUTH ONCE DAILY  . ferrous sulfate 325 (65 FE) MG tablet Take 325 mg by mouth 2 (two) times daily with a meal.  . gabapentin (NEURONTIN) 600 MG tablet TAKE 1 TABLET BY IN THE MORNING AND 2 ATBEDTIME  . glucose blood (ONE TOUCH ULTRA TEST) test strip USE AS DIRECTED TO CHECK BLOOD SUGAR 3 TIMES DAILY  . hydrALAZINE (APRESOLINE) 25 MG tablet Take 1/2 tablet by mouth 3 times a day.  . insulin aspart (NOVOLOG) 100 UNIT/ML FlexPen Inject 5 Units into the skin 3 (three) times daily with meals.  . Insulin Glargine (LANTUS SOLOSTAR) 100 UNIT/ML Solostar Pen Inject 22 Units into the skin daily.  . Insulin Pen Needle (PEN NEEDLES) 32G X 6 MM MISC 1 each by Does not apply  route 4 (four) times daily.  . isosorbide mononitrate (IMDUR) 30 MG 24 hr tablet Take 1 tablet (30 mg total) by mouth daily.  Marland Kitchen levothyroxine (SYNTHROID) 50 MCG tablet Take 1 tablet (50 mcg total) by mouth daily before breakfast.  . losartan (COZAAR) 25 MG tablet Take 0.5 tablets (12.5 mg total) by mouth daily.  . Multiple Vitamin (MULTIVITAMIN) tablet Take 1 tablet by mouth daily.  . Omega-3 Fatty Acids (FISH OIL) 1000 MG CAPS Take 1 capsule by mouth 2 (two) times a day.   . rosuvastatin (CRESTOR) 10 MG tablet Take 1 tablet (10 mg total) by mouth daily.  Marland Kitchen torsemide (DEMADEX) 20 MG tablet Take 4 tablets (80 mg total) in the morning and 2 tablets (40 mg total) in the afternoon.  . traMADol (ULTRAM) 50 MG tablet TAKE 1 TABLET BY MOUTH EVERY 6 HOURS AS NEEDED    No Known Allergies    Review of Systems negative except from HPI and PMH  Physical Exam BP (!) 98/42 (BP Location: Left Arm, Patient Position: Sitting, Cuff Size: Normal)   Pulse (!) 59   Ht 5\' 8"  (1.727 m)   Wt 246 lb 4 oz (111.7 kg)   SpO2 99%   BMI 37.44 kg/m  Well developed and well nourished in no acute distress HENT normal Neck supple with JVP-flat Clear Device pocket well healed; without hematoma or erythema.  There is no tethering  Regular rate and rhythm, no  murmur Abd-soft with active BS No Clubbing cyanosis   edema Skin-warm and dry A & Oriented  Grossly normal sensory and motor function  ECG sinus @ 59 qR lead 1 neg lead V1  Assessment and  Plan  CRT-D recent reimplantation following extraction  Nonischemic myopathy  Congestive heart failure-chronic-systolic class II   Hypothyroidism  High Risk Medication Surveillance  PVCs  Diaphragmatic stimulation   Functional status stable,    Not sure what caused the diaphragmatic stimulation, notable interestingly with both poles 1 and 4 but not so muchwith 2&3  Threshold 2-coil or 3 coil is 1.75 V  Will program 2- coil at 2.25V @ 1 msec

## 2019-11-11 NOTE — Telephone Encounter (Signed)
Reprogramed today to 2-coil @ 2.25 V/1.0 msec Threshold 1.75 volts

## 2019-11-15 ENCOUNTER — Telehealth: Payer: Self-pay | Admitting: *Deleted

## 2019-11-15 NOTE — Telephone Encounter (Signed)
-----   Message from Minna Merritts, MD sent at 11/13/2019 11:24 AM EST ----- Please see lab note

## 2019-11-15 NOTE — Telephone Encounter (Signed)
Left voicemail message for patient to call back regarding results and recommendations.     Vickie Merritts, MD  11/13/2019 11:24 AM EST    Creatinine and BUN high end of her range Would not let her weight drop too much below where she was on last clinic visit Was 56 in office, 244 at CHF clinic, goal likely 243 at home

## 2019-11-16 NOTE — Telephone Encounter (Signed)
Spoke with patient and reviewed results and recommendations. She read back information to keep weight around 243 and no lower. She verbalized understanding of our conversation, agreement with plan, and had no further questions at this time.

## 2019-11-18 ENCOUNTER — Ambulatory Visit: Payer: PPO

## 2019-11-21 ENCOUNTER — Ambulatory Visit: Payer: PPO | Attending: Internal Medicine

## 2019-11-21 DIAGNOSIS — Z23 Encounter for immunization: Secondary | ICD-10-CM

## 2019-11-21 NOTE — Progress Notes (Signed)
   Covid-19 Vaccination Clinic  Name:  [name]    MRN: [id number] DOB: 23-May-1956  11/21/2019  [name] was observed post Covid-19 immunization for 15 minutes without incidence. She was provided with Vaccine Information Sheet and instruction to access the V-Safe system.   [name] was instructed to call 911 with any severe reactions post vaccine: Marland Kitchen Difficulty breathing  . Swelling of your face and throat  . A fast heartbeat  . A bad rash all over your body  . Dizziness and weakness    Immunizations Administered    Name Date Dose VIS Date Route   Pfizer COVID-19 Vaccine 11/21/2019 10:12 AM 0.3 mL 09/17/2019 Intramuscular   Manufacturer: [place]   Lot: [id number]   [place]: [id number]

## 2019-11-24 ENCOUNTER — Other Ambulatory Visit (HOSPITAL_COMMUNITY): Payer: Self-pay | Admitting: Adult Health

## 2019-12-06 ENCOUNTER — Encounter: Payer: Self-pay | Admitting: Family Medicine

## 2019-12-06 ENCOUNTER — Other Ambulatory Visit: Payer: Self-pay

## 2019-12-06 ENCOUNTER — Telehealth (INDEPENDENT_AMBULATORY_CARE_PROVIDER_SITE_OTHER): Payer: PPO | Admitting: Family Medicine

## 2019-12-06 VITALS — Temp 98.0°F | Wt 240.0 lb

## 2019-12-06 DIAGNOSIS — E1165 Type 2 diabetes mellitus with hyperglycemia: Secondary | ICD-10-CM

## 2019-12-06 DIAGNOSIS — IMO0002 Reserved for concepts with insufficient information to code with codable children: Secondary | ICD-10-CM

## 2019-12-06 DIAGNOSIS — R197 Diarrhea, unspecified: Secondary | ICD-10-CM

## 2019-12-06 DIAGNOSIS — E1129 Type 2 diabetes mellitus with other diabetic kidney complication: Secondary | ICD-10-CM | POA: Diagnosis not present

## 2019-12-06 NOTE — Progress Notes (Signed)
Virtual Visit Note  I connected with patient on 12/06/19 at 255pm by video doximity and verified that I am speaking with the correct person using two identifiers. [name] is currently located in her car and patient is currently with them during visit. The provider, Rutherford Guys, MD is located in their office at time of visit.  I discussed the limitations, risks, security and privacy concerns of performing an evaluation and management service by telephone and the availability of in person appointments. I also discussed with the patient that there may be a patient responsible charge related to this service. The patient expressed understanding and agreed to proceed.   CC: diarrhea  HPI ? 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,MSSA bacteremia2/2 osteomyelitis s/p amputation,systolicCHF, OSA on cpap, DM2 on insulin, CKD 4, HTN, HLP, anemia  Last OV Nov 2020 - started bolus insulin, increased levothyroxine Followed closely by renal, EP and CHF  Patient reports diarrhea 2 days ago that started after spending day with her daughter and grandchildren celebrating bday She states that she has had no diarrhea today She denies any fever, chills, URI sx, loss of taste or smell, malaise, headaches, body aches  She reports that bedtime cbgs have been "horrible" as she is back to eating sweets Fasting are at goal, < 130 Bedtime tend to be > 200 Uses lantus 22 units qhs and novolog 5 units AC breakfast and dinner, she does not tend to eat lunch   Lab Results  Component Value Date   HGBA1C 8.1 (H) 09/06/2019   HGBA1C 6.7 (H) 03/23/2019   HGBA1C 5.6 02/03/2019   Lab Results  Component Value Date   MICROALBUR 6.9 08/06/2016   LDLCALC 55 09/06/2019   CREATININE 2.44 (H) 11/05/2019   GFR 20  Lab Results  Component Value Date   TSH 6.170 (H) 09/06/2019    No Known Allergies  Prior to Admission medications   Medication Sig Start Date End Date Taking?  Authorizing Provider  amiodarone (PACERONE) 200 MG tablet Take 200 mg by mouth daily.   Yes [provider]  Calcium Carbonate-Vitamin D3 (CALCIUM 600-D) 600-400 MG-UNIT TABS Take 1 tablet by mouth 2 (two) times a day.   Yes [provider]  carvedilol (COREG) 3.125 MG tablet Take 1 tablet (3.125 mg total) by mouth 2 (two) times daily with a meal. 09/14/19  Yes Deboraha Sprang, MD  clopidogrel (PLAVIX) 75 MG tablet TAKE 1 TABLET BY MOUTH ONCE DAILY 10/22/19  Yes Rutherford Guys, MD  ferrous sulfate 325 (65 FE) MG tablet Take 325 mg by mouth 2 (two) times daily with a meal.   Yes [provider]  gabapentin (NEURONTIN) 600 MG tablet TAKE 1 TABLET BY IN THE MORNING AND 2 ATBEDTIME 10/22/19  Yes Rutherford Guys, MD  glucose blood (ONE TOUCH ULTRA TEST) test strip USE AS DIRECTED TO CHECK BLOOD SUGAR 3 TIMES DAILY 01/09/19  Yes Rutherford Guys, MD  hydrALAZINE (APRESOLINE) 25 MG tablet TAKE 1/2 TABLET BY MOUTH 3 TIMES A DAY 11/24/19  Yes Bensimhon, Shaune Pascal, MD  insulin aspart (NOVOLOG) 100 UNIT/ML FlexPen Inject 5 Units into the skin 3 (three) times daily with meals. 09/09/19  Yes Rutherford Guys, MD  Insulin Glargine (LANTUS SOLOSTAR) 100 UNIT/ML Solostar Pen Inject 22 Units into the skin daily. 09/06/19  Yes Rutherford Guys, MD  Insulin Pen Needle (PEN NEEDLES) 32G X 6 MM MISC 1 each by Does not apply route 4 (four)  times daily. 09/06/19  Yes Rutherford Guys, MD  isosorbide mononitrate (IMDUR) 30 MG 24 hr tablet Take 1 tablet (30 mg total) by mouth daily. 05/10/19 05/09/20 Yes Clegg, Amy D, NP  levothyroxine (SYNTHROID) 50 MCG tablet Take 1 tablet (50 mcg total) by mouth daily before breakfast. 09/07/19  Yes Rutherford Guys, MD  losartan (COZAAR) 25 MG tablet Take 0.5 tablets (12.5 mg total) by mouth daily. 07/23/19 12/06/19 Yes Clegg, Amy D, NP  Multiple Vitamin (MULTIVITAMIN) tablet Take 1 tablet by mouth daily.   Yes [provider]  Omega-3 Fatty Acids (FISH OIL) 1000  MG CAPS Take 1 capsule by mouth 2 (two) times a day.    Yes [provider]  rosuvastatin (CRESTOR) 10 MG tablet Take 1 tablet (10 mg total) by mouth daily. 04/16/19  Yes Rutherford Guys, MD  torsemide (DEMADEX) 20 MG tablet Take 4 tablets (80 mg total) in the morning and 2 tablets (40 mg total) in the afternoon. 11/05/19  Yes Gollan, Kathlene November, MD  traMADol (ULTRAM) 50 MG tablet TAKE 1 TABLET BY MOUTH EVERY 6 HOURS AS NEEDED 10/22/19  Yes Rutherford Guys, MD    Past Medical History:  Diagnosis Date  . Acute myocardial infarction, subendocardial infarction, initial episode of care ([place]) 07/21/2012  . Acute osteomyelitis involving ankle and foot ([place]) 02/06/2015  . Acute systolic heart failure ([place]) 07/21/2012   New onset 07/19/12; admission to [facility] ED. Elevated Troponins.  S/p 2D-echo with EF 20-25%.  S/p cardiac catheterization with stenting LAD.  Repeat 2D-echo 10/2011 with improved EF of 35%.   . Anemia   . Automatic implantable cardioverter-defibrillator in situ    a. MDT CRT-D 06/2014, SN: [id number]  .removed in feb 2020  . CAD (coronary artery disease)    a. cardiac cath 101/04/2012: PCI/DES to chronically occluded mLAD, consideration PCI to diag branch in 4 weeks.   . Cataract   . Chicken pox   . Chronic kidney disease   . Chronic systolic CHF (congestive heart failure) (HCC)    a. mixed ICM & NICM; b. EF 20-25% by echo 07/2012, mid-dist 2/3 of LV sev HK/AK, mild MR. echo 10/2012: EF 30-35%, sev HK ant-septal & inf walls, GR1DD, mild MR, PASP 33. c. echo 02/2013: EF 30%, GR1DD, mild MR. echo 04/2014: EF 30%, Septal-lat dyssynchrony, global HK, inf AK, GR1DD, mild MR. d. echo 10/2014: EF50-55%, WM nl, GR1DD, septal mild paradox. e. echo 02/2015: EF 50-55%, wm   . Depression   . Heart attack ([place])   . Heel ulcer ([place]) 04/27/2015  . History of blood transfusion ~ 2011   "plasma; had neuropathy; couldn't walk"  . Hypertension   . LBBB (left bundle branch block)   . Neuromuscular  disorder ([place])   . Neuropathy 2011  . Obesity, unspecified   . OSA on CPAP    Moderate with AHI 23/hr and now on CPAP at 16cm H2O.  Her DME is [facility]  . Pure hypercholesterolemia   . Sepsis ([place]) 09/2018  . Type II diabetes mellitus ([place])   . Unspecified vitamin D deficiency     Past Surgical History:  Procedure Laterality Date  . AMPUTATION TOE Right 06/18/2015   Procedure: AMPUTATION TOE;  Surgeon: Samara Deist, DPM;  Location: [facility] ORS;  Service: Podiatry;  Laterality: Right;  . AMPUTATION TOE Left 10/08/2018   Procedure: AMPUTATION TOE LEFT 2ND;  Surgeon: Albertine Patricia, DPM;  Location: [facility] ORS;  Service: Podiatry;  Laterality: Left;  .  BACK SURGERY    . BI-VENTRICULAR IMPLANTABLE CARDIOVERTER DEFIBRILLATOR N/A 07/06/2014   Procedure: BI-VENTRICULAR IMPLANTABLE CARDIOVERTER DEFIBRILLATOR  (CRT-D);  Surgeon: Deboraha Sprang, MD;  Location: [facility] CATH LAB;  Service: Cardiovascular;  Laterality: N/A;  . BI-VENTRICULAR IMPLANTABLE CARDIOVERTER DEFIBRILLATOR  (CRT-D)  07/06/2014  . BILATERAL OOPHORECTOMY  01/2011   ovarian cyst benign  . BIV ICD INSERTION CRT-D N/A 03/31/2019   Procedure: BIV ICD INSERTION CRT-D;  Surgeon: Constance Haw, MD;  Location: [place] CV LAB;  Service: Cardiovascular;  Laterality: N/A;  . COLONOSCOPY WITH PROPOFOL Left 02/22/2015   Procedure: COLONOSCOPY WITH PROPOFOL;  Surgeon: Hulen Luster, MD;  Location: [facility] ENDOSCOPY;  Service: Endoscopy;  Laterality: Left;  . CORONARY ANGIOPLASTY WITH STENT PLACEMENT Left 07/2012   new onset systolic CHF; elevated troponins.  Cardiac catheterization with stenting to LAD; EF 15%.  2D-echo: EF 20-25%.  . ESOPHAGOGASTRODUODENOSCOPY N/A 02/22/2015   Procedure: ESOPHAGOGASTRODUODENOSCOPY (EGD);  Surgeon: Hulen Luster, MD;  Location: [facility] ENDOSCOPY;  Service: Endoscopy;  Laterality: N/A;  . INCISION AND DRAINAGE ABSCESS Right 2007   groin; with ICU stay due to sepsis.  Marland Kitchen LAPAROSCOPIC CHOLECYSTECTOMY  2011  . LEFT HEART CATH AND  CORONARY ANGIOGRAPHY N/A 10/05/2018   Procedure: LEFT HEART CATH AND CORONARY ANGIOGRAPHY;  Surgeon: Minna Merritts, MD;  Location: [place] CV LAB;  Service: Cardiovascular;  Laterality: N/A;  . LEFT HEART CATHETERIZATION WITH CORONARY ANGIOGRAM N/A 07/21/2012   Procedure: LEFT HEART CATHETERIZATION WITH CORONARY ANGIOGRAM;  Surgeon: Jolaine Artist, MD;  Location: [facility] CATH LAB;  Service: Cardiovascular;  Laterality: N/A;  . [place]   L4-5  . PACEMAKER REMOVAL  11/2018   due to infection around pacemaker  . PERCUTANEOUS CORONARY STENT INTERVENTION (PCI-S) N/A 07/23/2012   Procedure: PERCUTANEOUS CORONARY STENT INTERVENTION (PCI-S);  Surgeon: Sherren Mocha, MD;  Location: [facility] CATH LAB;  Service: Cardiovascular;  Laterality: N/A;  . PERIPHERAL VASCULAR BALLOON ANGIOPLASTY Left 10/06/2018   Procedure: PERIPHERAL VASCULAR BALLOON ANGIOPLASTY;  Surgeon: Katha Cabal, MD;  Location: [place] CV LAB;  Service: Cardiovascular;  Laterality: Left;  . PERIPHERAL VASCULAR CATHETERIZATION N/A 02/10/2015   Procedure: Picc Line Insertion;  Surgeon: Katha Cabal, MD;  Location: [place] CV LAB;  Service: Cardiovascular;  Laterality: N/A;  . RIGHT HEART CATH N/A 03/29/2019   Procedure: RIGHT HEART CATH;  Surgeon: Jolaine Artist, MD;  Location: [place] [facility] CV LAB;  Service: Cardiovascular;  Laterality: N/A;  . TEE WITHOUT CARDIOVERSION N/A 10/02/2018   Procedure: TRANSESOPHAGEAL ECHOCARDIOGRAM (TEE);  Surgeon: Wellington Hampshire, MD;  Location: [facility] ORS;  Service: Cardiovascular;  Laterality: N/A;  . [place]  . VAGINAL HYSTERECTOMY  01/2011   Fibroids/DUB.  Ovaries removed. [place].    Social History   Tobacco Use  . Smoking status: Never Smoker  . Smokeless tobacco: Never Used  Substance Use Topics  . Alcohol use: No    Alcohol/week: 0.0 standard drinks    Family History  Problem Relation Age of Onset  . Diabetes Mother   .  Hypertension Mother   . Arthritis Mother        knees, lumbar DDD, cervical DDD  . Cancer Father        prostate,skin,lymphoma.  . Cancer Brother 46       bladder cancer; non-smoker  . Diabetes Maternal Grandmother   . Heart disease Maternal Grandmother   . COPD Maternal Grandmother   . Diabetes Paternal Grandmother   .  Obesity Brother   . Diabetes Son   . Hypertension Son   . Cancer Maternal Grandfather   . Diabetes Paternal Grandfather     ROS Per hpi   Objective  Vitals as reported by the patient:  Vitals:   12/06/19 1425  Temp: 33 F (36.7 C)  Weight: 240 lb (108.9 kg)    GEN: AAOx3, NAD HEENT: [place]/AT, pupils are symmetrical, EOMI, non-icteric sclera Resp: breathing comfortably, speaking in full sentences Skin: no rashes noted, no pallor Psych: good eye contact, normal mood and affect  ASSESSMENT and PLAN  1. Diarrhea of presumed infectious origin Most likely food poisoning, test for covid 5 days after start of sx. Discussed RTC precautions.  - Novel Coronavirus, NAA ([facility])  2. DM type 2, uncontrolled, with renal complications [facility]) Patient declines referral to CDE or endo. Discussed importance of LFM. Increase novolog to 10 units AC dinner, increase testing frequency. Discussed ssx/mgt of lows  FOLLOW-UP: 4 weeks with cbg log   The above assessment and management plan was discussed with the patient. The patient verbalized understanding of and has agreed to the management plan. Patient is aware to call the clinic if symptoms persist or worsen. Patient is aware when to return to the clinic for a follow-up visit. Patient educated on when it is appropriate to go to the emergency department.    I provided 19 minutes of non-face-to-face time during this encounter.  Rutherford Guys, MD Primary Care at [place], [place] [address] Ph.  [phone] Fax [phone]

## 2019-12-06 NOTE — Patient Instructions (Addendum)
covid testing: scheduling can be done online at [email or web address] or by texting "COVID" to [phone].    If you have lab work done today you will be contacted with your lab results within the next 2 weeks.  If you have not heard from [place] then please contact us. The fastest way to get your results is to register for My Chart.   IF you received an x-ray today, you will receive an invoice from [facility]. Please contact [facility] at [phone] with questions or concerns regarding your invoice.   IF you received labwork today, you will receive an invoice from [place]. Please contact [facility] at [phone] with questions or concerns regarding your invoice.   Our billing staff will not be able to assist you with questions regarding bills from these companies.  You will be contacted with the lab results as soon as they are available. The fastest way to get your results is to activate your My Chart account. Instructions are located on the last page of this paperwork. If you have not heard from [place] regarding the results in 2 weeks, please contact this office.

## 2019-12-07 ENCOUNTER — Encounter: Payer: Self-pay | Admitting: Family Medicine

## 2019-12-16 ENCOUNTER — Other Ambulatory Visit: Payer: Self-pay | Admitting: Family Medicine

## 2019-12-16 NOTE — Telephone Encounter (Signed)
pmp reviewd, appropriate meds refilled 

## 2019-12-16 NOTE — Telephone Encounter (Signed)
Requested medication (s) are due for refill today: yes  Requested medication (s) are on the active medication list: yes  Last refill:  10/22/19  Future visit scheduled: No  Notes to clinic:  Not delegated    Requested Prescriptions  Pending Prescriptions Disp Refills   traMADol (ULTRAM) 50 MG tablet [Pharmacy Med Name: TRAMADOL HCL 50 MG TAB] 120 tablet     Sig: TAKE 1 TABLET BY MOUTH EVERY 6 HOURS AS NEEDED      Not Delegated - Analgesics:  Opioid Agonists Failed - 12/16/2019  3:49 PM      Failed - This refill cannot be delegated      Failed - Urine Drug Screen completed in last 360 days.      Passed - Valid encounter within last 6 months    Recent Outpatient Visits           1 week ago Diarrhea of presumed infectious origin   Primary Care at [facility], Lilia Argue, MD   3 months ago DM type 2, uncontrolled, with renal complications [facility])   [facility] at [facility], Lilia Argue, MD   8 months ago Diabetic peripheral neuropathy associated with type 2 diabetes mellitus [facility])   Primary Care at [facility], Lilia Argue, MD   8 months ago Acute on chronic systolic CHF (congestive heart failure) [facility])   Primary Care at [facility], Lilia Argue, MD   9 months ago Acute on chronic systolic CHF (congestive heart failure) [facility])   Primary Care at [facility], Lilia Argue, MD       Future Appointments             In 2 weeks Gollan, Kathlene November, MD [facility]

## 2019-12-21 ENCOUNTER — Ambulatory Visit: Payer: PPO | Attending: Internal Medicine

## 2019-12-21 DIAGNOSIS — Z23 Encounter for immunization: Secondary | ICD-10-CM

## 2019-12-21 NOTE — Progress Notes (Signed)
   Covid-19 Vaccination Clinic  Name:  CAROLY PUREWAL    MRN: [id number] DOB: 08-06-1956  12/21/2019  Ms. Gallogly was observed post Covid-19 immunization for 15 minutes without incident. She was provided with Vaccine Information Sheet and instruction to access the V-Safe system.   Ms. Bunyan was instructed to call 911 with any severe reactions post vaccine: Marland Kitchen Difficulty breathing  . Swelling of face and throat  . A fast heartbeat  . A bad rash all over body  . Dizziness and weakness   Immunizations Administered    Name Date Dose VIS Date Route   Pfizer COVID-19 Vaccine 12/21/2019 10:27 AM 0.3 mL 09/17/2019 Intramuscular   Manufacturer: [place]   Lot: [id number]   [place]: [address]-5

## 2019-12-27 ENCOUNTER — Other Ambulatory Visit: Payer: PPO | Admitting: Nurse Practitioner

## 2019-12-27 ENCOUNTER — Telehealth: Payer: Self-pay | Admitting: Nurse Practitioner

## 2019-12-27 ENCOUNTER — Other Ambulatory Visit: Payer: Self-pay

## 2019-12-27 NOTE — Telephone Encounter (Signed)
I attempted to call [name] for scheduled palliative f/u telemedicine visit, no answer, message left with contact information

## 2019-12-30 ENCOUNTER — Ambulatory Visit (INDEPENDENT_AMBULATORY_CARE_PROVIDER_SITE_OTHER): Payer: PPO | Admitting: *Deleted

## 2019-12-30 DIAGNOSIS — I5022 Chronic systolic (congestive) heart failure: Secondary | ICD-10-CM | POA: Diagnosis not present

## 2019-12-30 LAB — CUP PACEART REMOTE DEVICE CHECK
Battery Remaining Longevity: 80 mo
Battery Voltage: 3 V
Brady Statistic AP VP Percent: 2.4 %
Brady Statistic AP VS Percent: 0.02 %
Brady Statistic AS VP Percent: 97.42 %
Brady Statistic AS VS Percent: 0.16 %
Brady Statistic RA Percent Paced: 2.41 %
Brady Statistic RV Percent Paced: 99.47 %
Date Time Interrogation Session: 20210325012303
HighPow Impedance: 49 Ohm
Implantable Lead Implant Date: 20200625
Implantable Lead Implant Date: 20200625
Implantable Lead Implant Date: 20200625
Implantable Lead Location: 753858
Implantable Lead Location: 753859
Implantable Lead Location: 753860
Implantable Lead Model: 4398
Implantable Lead Model: 5076
Implantable Lead Model: 6935
Implantable Pulse Generator Implant Date: 20200625
Lead Channel Impedance Value: 205.114
Lead Channel Impedance Value: 211.891
Lead Channel Impedance Value: 215.064
Lead Channel Impedance Value: 246.635
Lead Channel Impedance Value: 250.943
Lead Channel Impedance Value: 361 Ohm
Lead Channel Impedance Value: 361 Ohm
Lead Channel Impedance Value: 418 Ohm
Lead Channel Impedance Value: 418 Ohm
Lead Channel Impedance Value: 475 Ohm
Lead Channel Impedance Value: 513 Ohm
Lead Channel Impedance Value: 532 Ohm
Lead Channel Impedance Value: 760 Ohm
Lead Channel Impedance Value: 817 Ohm
Lead Channel Impedance Value: 817 Ohm
Lead Channel Impedance Value: 836 Ohm
Lead Channel Impedance Value: 893 Ohm
Lead Channel Impedance Value: 931 Ohm
Lead Channel Pacing Threshold Amplitude: 0.625 V
Lead Channel Pacing Threshold Amplitude: 0.875 V
Lead Channel Pacing Threshold Amplitude: 1.375 V
Lead Channel Pacing Threshold Pulse Width: 0.4 ms
Lead Channel Pacing Threshold Pulse Width: 0.4 ms
Lead Channel Pacing Threshold Pulse Width: 1 ms
Lead Channel Sensing Intrinsic Amplitude: 0.625 mV
Lead Channel Sensing Intrinsic Amplitude: 0.625 mV
Lead Channel Sensing Intrinsic Amplitude: 14.125 mV
Lead Channel Sensing Intrinsic Amplitude: 14.125 mV
Lead Channel Setting Pacing Amplitude: 2 V
Lead Channel Setting Pacing Amplitude: 2 V
Lead Channel Setting Pacing Amplitude: 2.25 V
Lead Channel Setting Pacing Pulse Width: 0.4 ms
Lead Channel Setting Pacing Pulse Width: 1 ms
Lead Channel Setting Sensing Sensitivity: 0.3 mV

## 2019-12-30 NOTE — Progress Notes (Signed)
ICD Remote  

## 2020-01-03 ENCOUNTER — Ambulatory Visit: Payer: PPO | Admitting: Cardiovascular Disease

## 2020-01-13 DIAGNOSIS — G4733 Obstructive sleep apnea (adult) (pediatric): Secondary | ICD-10-CM | POA: Diagnosis not present

## 2020-01-19 DIAGNOSIS — R768 Other specified abnormal immunological findings in serum: Secondary | ICD-10-CM | POA: Diagnosis not present

## 2020-01-19 DIAGNOSIS — D631 Anemia in chronic kidney disease: Secondary | ICD-10-CM | POA: Diagnosis not present

## 2020-01-19 DIAGNOSIS — E875 Hyperkalemia: Secondary | ICD-10-CM | POA: Diagnosis not present

## 2020-01-19 DIAGNOSIS — N2581 Secondary hyperparathyroidism of renal origin: Secondary | ICD-10-CM | POA: Diagnosis not present

## 2020-01-19 DIAGNOSIS — R809 Proteinuria, unspecified: Secondary | ICD-10-CM | POA: Diagnosis not present

## 2020-01-19 DIAGNOSIS — N184 Chronic kidney disease, stage 4 (severe): Secondary | ICD-10-CM | POA: Diagnosis not present

## 2020-01-19 DIAGNOSIS — E1122 Type 2 diabetes mellitus with diabetic chronic kidney disease: Secondary | ICD-10-CM | POA: Diagnosis not present

## 2020-01-19 DIAGNOSIS — I129 Hypertensive chronic kidney disease with stage 1 through stage 4 chronic kidney disease, or unspecified chronic kidney disease: Secondary | ICD-10-CM | POA: Diagnosis not present

## 2020-01-26 DIAGNOSIS — I129 Hypertensive chronic kidney disease with stage 1 through stage 4 chronic kidney disease, or unspecified chronic kidney disease: Secondary | ICD-10-CM | POA: Diagnosis not present

## 2020-01-26 DIAGNOSIS — E875 Hyperkalemia: Secondary | ICD-10-CM | POA: Diagnosis not present

## 2020-01-26 DIAGNOSIS — E1122 Type 2 diabetes mellitus with diabetic chronic kidney disease: Secondary | ICD-10-CM | POA: Diagnosis not present

## 2020-01-26 DIAGNOSIS — N184 Chronic kidney disease, stage 4 (severe): Secondary | ICD-10-CM | POA: Diagnosis not present

## 2020-01-26 DIAGNOSIS — N2581 Secondary hyperparathyroidism of renal origin: Secondary | ICD-10-CM | POA: Diagnosis not present

## 2020-01-26 DIAGNOSIS — D631 Anemia in chronic kidney disease: Secondary | ICD-10-CM | POA: Diagnosis not present

## 2020-01-26 DIAGNOSIS — R768 Other specified abnormal immunological findings in serum: Secondary | ICD-10-CM | POA: Diagnosis not present

## 2020-01-26 DIAGNOSIS — R809 Proteinuria, unspecified: Secondary | ICD-10-CM | POA: Diagnosis not present

## 2020-01-28 ENCOUNTER — Other Ambulatory Visit: Payer: Self-pay | Admitting: Family Medicine

## 2020-01-28 NOTE — Telephone Encounter (Signed)
Patient is requesting a refill of the following medications: Requested Prescriptions   Pending Prescriptions Disp Refills   clopidogrel (PLAVIX) 75 MG tablet [Pharmacy Med Name: CLOPIDOGREL BISULFATE 75 MG TAB] 90 tablet 0    Sig: TAKE 1 TABLET BY MOUTH ONCE DAILY    Date of patient request: 01/28/2020 Last office visit: 09/06/2019 Date of last refill: 10/22/2019 Last refill amount: 90 tabs Follow up time period per chart: 3 months

## 2020-01-28 NOTE — Telephone Encounter (Signed)
Call to patient- left message to come for lab to recheck TSH level- courtesy refill given

## 2020-01-31 DIAGNOSIS — H43813 Vitreous degeneration, bilateral: Secondary | ICD-10-CM | POA: Diagnosis not present

## 2020-01-31 DIAGNOSIS — Z961 Presence of intraocular lens: Secondary | ICD-10-CM | POA: Diagnosis not present

## 2020-01-31 DIAGNOSIS — H35372 Puckering of macula, left eye: Secondary | ICD-10-CM | POA: Diagnosis not present

## 2020-01-31 DIAGNOSIS — E113393 Type 2 diabetes mellitus with moderate nonproliferative diabetic retinopathy without macular edema, bilateral: Secondary | ICD-10-CM | POA: Diagnosis not present

## 2020-02-04 DIAGNOSIS — E113313 Type 2 diabetes mellitus with moderate nonproliferative diabetic retinopathy with macular edema, bilateral: Secondary | ICD-10-CM | POA: Diagnosis not present

## 2020-02-04 DIAGNOSIS — H35372 Puckering of macula, left eye: Secondary | ICD-10-CM | POA: Diagnosis not present

## 2020-02-04 DIAGNOSIS — H43813 Vitreous degeneration, bilateral: Secondary | ICD-10-CM | POA: Diagnosis not present

## 2020-02-07 ENCOUNTER — Telehealth: Payer: Self-pay

## 2020-02-07 DIAGNOSIS — Z89422 Acquired absence of other left toe(s): Secondary | ICD-10-CM | POA: Diagnosis not present

## 2020-02-07 DIAGNOSIS — S90852A Superficial foreign body, left foot, initial encounter: Secondary | ICD-10-CM | POA: Diagnosis not present

## 2020-02-07 DIAGNOSIS — E1142 Type 2 diabetes mellitus with diabetic polyneuropathy: Secondary | ICD-10-CM | POA: Diagnosis not present

## 2020-02-07 DIAGNOSIS — B351 Tinea unguium: Secondary | ICD-10-CM | POA: Diagnosis not present

## 2020-02-07 NOTE — Telephone Encounter (Signed)
   [facility] Pre-operative Risk Assessment    Request for surgical clearance:  1. What type of surgery is being performed? Vitrectomy, membrane peel and intravitreal Eylea    2. When is this surgery scheduled? TBD   3. What type of clearance is required (medical clearance vs. Pharmacy clearance to hold med vs. Both)? Both  4. Are there any medications that need to be held prior to surgery and how long? Clopidogril   5. Practice name and name of physician performing surgery? Sherlynn Stalls, MD with [facility]    6. What is your office phone number [phone]    7.   What is your office fax number [phone]  8.   Anesthesia type (None, local, MAC, general) ? MAC   Yves Fodor R Madysyn Hanken 02/07/2020, 1:26 PM  _________________________________________________________________   (provider comments below)

## 2020-02-08 NOTE — Telephone Encounter (Signed)
[name] has appointment in 3 1/2 weeks with Dr. Rockey Situ. March 07, 2020. She can be cleared at that appointment as the date of surgery is TBD.  She has multiple medical issues and holding of Plavix can be discussed at that visit. If this is urgent please notify our office so she can be seen sooner.   Jory Sims DNP, ANP, AACC [facility]

## 2020-02-17 DIAGNOSIS — Z89422 Acquired absence of other left toe(s): Secondary | ICD-10-CM | POA: Diagnosis not present

## 2020-02-17 DIAGNOSIS — E1142 Type 2 diabetes mellitus with diabetic polyneuropathy: Secondary | ICD-10-CM | POA: Diagnosis not present

## 2020-02-17 DIAGNOSIS — S90852A Superficial foreign body, left foot, initial encounter: Secondary | ICD-10-CM | POA: Diagnosis not present

## 2020-02-23 ENCOUNTER — Other Ambulatory Visit: Payer: Self-pay | Admitting: Cardiovascular Disease

## 2020-02-23 ENCOUNTER — Other Ambulatory Visit: Payer: Self-pay | Admitting: *Deleted

## 2020-02-23 ENCOUNTER — Other Ambulatory Visit: Payer: Self-pay | Admitting: Family Medicine

## 2020-02-23 NOTE — Telephone Encounter (Signed)
Requested medication (s) are due for refill today:   Yes  Requested medication (s) are on the active medication list:   Yes  Future visit scheduled:   No   Last ordered: 01/28/2020  #30  0 refills  This was a courtesy refill  Clinic note:   Returned because she has had her 30 day courtesy refill.   Provider to review  Requested Prescriptions  Pending Prescriptions Disp Refills   levothyroxine (SYNTHROID) 50 MCG tablet [Pharmacy Med Name: LEVOTHYROXINE SODIUM 50 MCG TAB] 30 tablet 0    Sig: TAKE 1 TABLET BY MOUTH ONCE DAILY BEFOREBREAKFAST      Endocrinology:  Hypothyroid Agents Failed - 02/23/2020  1:32 PM      Failed - TSH needs to be rechecked within 3 months after an abnormal result. Refill until TSH is due.      Failed - TSH in normal range and within 360 days    TSH  Date Value Ref Range Status  09/06/2019 6.170 (H) 0.450 - 4.500 uIU/mL Final          Passed - Valid encounter within last 12 months    Recent Outpatient Visits           2 months ago Diarrhea of presumed infectious origin   Primary Care at [facility], Lilia Argue, MD   5 months ago DM type 2, uncontrolled, with renal complications [facility])   [facility] at [facility], Lilia Argue, MD   10 months ago Diabetic peripheral neuropathy associated with type 2 diabetes mellitus [facility])   Primary Care at [facility], Lilia Argue, MD   11 months ago Acute on chronic systolic CHF (congestive heart failure) [facility])   Primary Care at [facility], Lilia Argue, MD   12 months ago Acute on chronic systolic CHF (congestive heart failure) [facility])   Primary Care at [facility], Lilia Argue, MD

## 2020-02-23 NOTE — Telephone Encounter (Signed)
Ok to refill Dr. Donivan Scull 11/05/19 o/v notes under the assessment and plan has the patient taking Torsemide 80mg  in the am and 40 mg in the pm.

## 2020-02-23 NOTE — Telephone Encounter (Signed)
Please advise if ok to refill  Torsemide 20 mg tablet. Pt takes 4 tablets am and 2 tablets pm qd.  Last OV notes pt is taking 80 mg tablet qd.

## 2020-03-02 ENCOUNTER — Other Ambulatory Visit: Payer: Self-pay | Admitting: Family Medicine

## 2020-03-07 ENCOUNTER — Other Ambulatory Visit: Payer: PPO | Admitting: Nurse Practitioner

## 2020-03-07 ENCOUNTER — Other Ambulatory Visit: Payer: Self-pay

## 2020-03-07 ENCOUNTER — Encounter: Payer: Self-pay | Admitting: Nurse Practitioner

## 2020-03-07 ENCOUNTER — Ambulatory Visit (INDEPENDENT_AMBULATORY_CARE_PROVIDER_SITE_OTHER): Payer: PPO

## 2020-03-07 DIAGNOSIS — Z515 Encounter for palliative care: Secondary | ICD-10-CM

## 2020-03-07 DIAGNOSIS — I5022 Chronic systolic (congestive) heart failure: Secondary | ICD-10-CM

## 2020-03-07 DIAGNOSIS — I428 Other cardiomyopathies: Secondary | ICD-10-CM | POA: Diagnosis not present

## 2020-03-07 DIAGNOSIS — I509 Heart failure, unspecified: Secondary | ICD-10-CM

## 2020-03-07 NOTE — Progress Notes (Signed)
Therapist, nutritional Palliative Care Consult Note Telephone: [phone]  Fax: [phone]  PATIENT NAME: [name] DOB: 1956-07-21 MRN: [id number]  PRIMARY CARE PROVIDER:   Myles Lipps, MD  REFERRING PROVIDER:  Myles Lipps, MD [address] [place],  [place] [address]  RESPONSIBLE PARTY:   Self Due to the COVID-19 crisis, this visit was done via telemedicine from my office and it was initiated and consent by this patient and or family.  RECOMMENDATIONS and PLAN: 1.ACP: DNR; Treat what is treatable  2.Dyspneic secondary to remain stable at present time. Continue daily weights  3. Palliative care encounter; Palliative medicine team will continue to support patient, patient's family, and medical team. Visit consisted of counseling and education dealing with the complex and emotionally intense issues of symptom management and palliative care in the setting of serious and potentially life-threatening illness  I spent 50 minutes providing this consultation,  from 9:00am to 9:50am. More than 50% of the time in this consultation was spent coordinating communication.   HISTORY OF PRESENT ILLNESS:  [name] is a 64 y.o. year old female with multiple medical problems including  Late onset CVA, Systolic congestive heart failure, coronary artery disease, automatic implantable cardioverter-defibrillator placed, myocardial infarction, left bundle branch block, his shaved endocarditis, obstructive sleep apnea, diabetes, hyperlipidemia, lymphedema, hypertension, obesity, neuropathy, chronic kidney disease, iron deficiency anemia, depression, history of osteomyelitis ankle/foot 2013, cataracts, neuropathy, vaginal hysterectomy with oophorectomy, tubal ligation, TEE without cardioversion, right, left toe amputation, lumbar disc surgery, cholecystectomy.Palliative care follow-up visit telemedicine telephonic is video not available for [name]. This  caring sir and I talked about purpose of palliative care visit. [name] for in agreement. We talked about how she has been feeling. We talked about [name] continues to reside at home independently. 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 [name] endorse prior to her husband passing he had rental properties and ask her daughter not to rent this trailer out so she can update it, works out as she will be closer to her daughter. We talked about recent appointments. We talked about how she is feeling today. [name] endorses she is doing very well. [name] endorses the only thing that she would change would be her current eating habits. [name] at present time with all the work they have been doing on the trailer that is easier to go to fast food then it is to make a healthy meal. Appetite has been good. Weight is up. We talked about concern for increase in weight. We talked about alternatives including salads and [employer]. We talked about nutrition. We talked about recent appointment with Nephrology, [name]. We talked about chronic kidney disease stage 4. [name] endorse is that present time kidney function is stable. We talked about medical goals of care. We talked about previous discussions of dialysis when [name] was critically ill. At that time [name] did not want to have dialysis. We revisited the topic of dialysis if later she does require hemodialysis [name] endorse she would be interested in having that discussion, she would possibly wish to have dialysis if needed. We talked about Cardiology visit. We talked about quality-of-life. We talked about at present time [name] doing well. We talked about family dynamics. We talked about role of palliative care and plan of care. We talked about follow-up visit in  10 months if needed or sooner should she declined. Vickie.  [name] in agreement. Appointment scheduled for face-to-face. Therapeutic listening and emotional support provided. [name] endorses she is thankful for palliative care visit, support. Questions answered to satisfaction. Contact information. Palliative Care was asked to help to continue to address goals of care.   CODE STATUS: DNR  PPS: 60% HOSPICE ELIGIBILITY/DIAGNOSIS: TBD  PAST MEDICAL HISTORY:  Past Medical History:  Diagnosis Date  . Acute myocardial infarction, subendocardial infarction, initial episode of care (HCC) 07/21/2012  . Acute osteomyelitis involving ankle and foot (HCC) 02/06/2015  . Acute systolic heart failure (HCC) 07/21/2012   New onset 07/19/12; admission to [facility] ED. Elevated Troponins.  S/p 2D-echo with EF 20-25%.  S/p cardiac catheterization with stenting LAD.  Repeat 2D-echo 10/2011 with improved EF of 35%.   . Anemia   . Automatic implantable cardioverter-defibrillator in situ    a. MDT CRT-D 06/2014, SN: [id number]  .removed in feb 2020  . CAD (coronary artery disease)    a. cardiac cath 101/04/2012: PCI/DES to chronically occluded mLAD, consideration PCI to diag branch in 4 weeks.   . Cataract   . Chicken pox   . Chronic kidney disease   . Chronic systolic CHF (congestive heart failure) (HCC)    a. mixed ICM & NICM; b. EF 20-25% by echo 07/2012, mid-dist 2/3 of LV sev HK/AK, mild MR. echo 10/2012: EF 30-35%, sev HK ant-septal & inf walls, GR1DD, mild MR, PASP 33. c. echo 02/2013: EF 30%, GR1DD, mild MR. echo 04/2014: EF 30%, Septal-lat dyssynchrony, global HK, inf AK, GR1DD, mild MR. d. echo 10/2014: EF50-55%, WM nl, GR1DD, septal mild paradox. e. echo 02/2015: EF 50-55%, wm   . Depression   . Heart attack (HCC)   . Heel ulcer (HCC) 04/27/2015  . History of blood transfusion ~ 2011   "plasma; had neuropathy; couldn't walk"  . Hypertension   . LBBB (left bundle branch block)   . Neuromuscular disorder (HCC)   . Neuropathy 2011  . Obesity, unspecified     . OSA on CPAP    Moderate with AHI 23/hr and now on CPAP at 16cm H2O.  Her DME is [facility]  . Pure hypercholesterolemia   . Sepsis (HCC) 09/2018  . Type II diabetes mellitus (HCC)   . Unspecified vitamin D deficiency     SOCIAL HX:  Social History   Tobacco Use  . Smoking status: Never Smoker  . Smokeless tobacco: Never Used  Substance Use Topics  . Alcohol use: No    Alcohol/week: 0.0 standard drinks    ALLERGIES: No Known Allergies   PERTINENT MEDICATIONS:  Outpatient Encounter Medications as of 03/07/2020  Medication Sig  . amiodarone (PACERONE) 200 MG tablet Take 200 mg by mouth daily.  . Calcium Carbonate-Vitamin D3 (CALCIUM 600-D) 600-400 MG-UNIT TABS Take 1 tablet by mouth 2 (two) times a day.  . carvedilol (COREG) 3.125 MG tablet Take 1 tablet (3.125 mg total) by mouth 2 (two) times daily with a meal.  . clopidogrel (PLAVIX) 75 MG tablet TAKE 1 TABLET BY MOUTH ONCE DAILY  . ferrous sulfate 325 (65 FE) MG tablet Take 325 mg by mouth 2 (two) times daily with a meal.  . gabapentin (NEURONTIN) 600 MG tablet TAKE 1 TABLET BY IN THE MORNING AND 2 ATBEDTIME  . hydrALAZINE (APRESOLINE) 25 MG tablet TAKE 1/2 TABLET BY MOUTH 3 TIMES A DAY  . insulin aspart (NOVOLOG) 100 UNIT/ML FlexPen Inject 5 Units into the skin  3 (three) times daily with meals.  . Insulin Glargine (LANTUS SOLOSTAR) 100 UNIT/ML Solostar Pen Inject 22 Units into the skin daily.  . Insulin Pen Needle (PEN NEEDLES) 32G X 6 MM MISC 1 each by Does not apply route 4 (four) times daily.  . isosorbide mononitrate (IMDUR) 30 MG 24 hr tablet Take 1 tablet (30 mg total) by mouth daily.  Marland Kitchen levothyroxine (SYNTHROID) 50 MCG tablet TAKE 1 TABLET BY MOUTH ONCE DAILY BEFOREBREAKFAST  . losartan (COZAAR) 25 MG tablet Take 0.5 tablets (12.5 mg total) by mouth daily.  . Multiple Vitamin (MULTIVITAMIN) tablet Take 1 tablet by mouth daily.  . Omega-3 Fatty Acids (FISH OIL) 1000 MG CAPS Take 1 capsule by mouth 2 (two) times a day.   Letta Pate ULTRA test strip UUD TO CHECK BLOOD SUGAR 3 TIMES DAILY  . rosuvastatin (CRESTOR) 10 MG tablet Take 1 tablet (10 mg total) by mouth daily.  Marland Kitchen torsemide (DEMADEX) 20 MG tablet TAKE 4 TABLETS (80 MG TOTAL) IN THE MORNING AND 2 TABS (40 MG) IN THE AFTERNOON.  . traMADol (ULTRAM) 50 MG tablet TAKE 1 TABLET BY MOUTH EVERY 6 HOURS AS NEEDED   No facility-administered encounter medications on file as of 03/07/2020.    PHYSICAL EXAM:  Deferred  Jasenia Weilbacher Z Dameir Gentzler, NP

## 2020-03-15 DIAGNOSIS — Z634 Disappearance and death of family member: Secondary | ICD-10-CM | POA: Diagnosis not present

## 2020-03-15 DIAGNOSIS — F4321 Adjustment disorder with depressed mood: Secondary | ICD-10-CM | POA: Diagnosis not present

## 2020-03-15 DIAGNOSIS — T402X5D Adverse effect of other opioids, subsequent encounter: Secondary | ICD-10-CM | POA: Diagnosis not present

## 2020-03-15 DIAGNOSIS — G8929 Other chronic pain: Secondary | ICD-10-CM | POA: Diagnosis not present

## 2020-03-24 ENCOUNTER — Telehealth: Payer: Self-pay

## 2020-03-24 NOTE — Telephone Encounter (Signed)
I have [facility] a fax from [facility] they have,written the orders there were verbal. Pt plan of care form placed in provide sign bin.

## 2020-03-28 ENCOUNTER — Other Ambulatory Visit: Payer: Self-pay

## 2020-03-28 ENCOUNTER — Encounter: Payer: Self-pay | Admitting: Internal Medicine

## 2020-03-28 ENCOUNTER — Ambulatory Visit (INDEPENDENT_AMBULATORY_CARE_PROVIDER_SITE_OTHER): Payer: PPO | Admitting: Internal Medicine

## 2020-03-28 VITALS — BP 100/60 | HR 60 | Ht 68.0 in | Wt 260.2 lb

## 2020-03-28 DIAGNOSIS — Z79899 Other long term (current) drug therapy: Secondary | ICD-10-CM

## 2020-03-28 DIAGNOSIS — I5022 Chronic systolic (congestive) heart failure: Secondary | ICD-10-CM | POA: Diagnosis not present

## 2020-03-28 DIAGNOSIS — I428 Other cardiomyopathies: Secondary | ICD-10-CM

## 2020-03-28 DIAGNOSIS — Z95810 Presence of automatic (implantable) cardiac defibrillator: Secondary | ICD-10-CM

## 2020-03-28 DIAGNOSIS — I493 Ventricular premature depolarization: Secondary | ICD-10-CM | POA: Diagnosis not present

## 2020-03-28 MED ORDER — NITROGLYCERIN 0.4 MG SL SUBL: 0.4000 mg | SUBLINGUAL_TABLET | SUBLINGUAL | 3 refills | Status: DC | PRN

## 2020-03-28 NOTE — Progress Notes (Signed)
Patient Care Team: Rutherford Guys, MD as PCP - General (Family Medicine) Minna Merritts, MD as PCP - Cardiology (Cardiology) Deboraha Sprang, MD as PCP - Electrophysiology (Cardiology) Bensimhon, Shaune Pascal, MD as PCP - Advanced Heart Failure (Cardiology) Calvert Cantor, MD as Consulting Physician (Ophthalmology) Lavonia Dana, MD as Consulting Physician (Internal Medicine) Elvina Mattes, Adele Schilder as Attending Physician (Podiatry) Tsosie Billing, MD as Consulting Physician (Infectious Diseases)   HPI  [name] is a 64 y.o. female Seen in follow-up for pocket hematoma related to a CRT-D re implantation 03/31/2019 [facility]) .  She had undergone extraction 3/20 2/2 recurrent MSSA bacteremia at [facility]  Patient had initial near normalization of LV systolic function following CRT with significant loss of LV function with extraction, although EM reminded me that her husband dies intercurrently as well (? [place]) Following reimplantation LV function has improved considerably (see below)  History of atrial tachycardia for which she has been treated with amiodarone.  Patient denies symptoms of GI intolerance, sun sensitivity, neurological symptoms attributable to amiodarone.   Mild shortness of breath some edema.  No chest pain.  No palpitations.     DATE TEST EF   9/15 echo 30 %   1 /17 echo 50 %   12/19 Echo 45-50%   12/19 TEE  No vegetations  12/19 LHC  pLAD80 with ISR; mLAD70 pCx 80;D1 90 mRCA 40  6/20 Echo  10-15%   12/20 Echo 35%   6/21 Echo  50-55%          Date Cr K Hgb TSH LFTs  3./20 3.54>>1.18  11 4.64  18  7/20 2.15 4.9 9.7 (6/20)17.4    18  9/20  2.02 4.3  21.4   11/20 2.17 4.7 11.2 6.17 23  1/21 2.44 4.9                Records and Results Reviewed   Past Medical History:  Diagnosis Date  . Acute myocardial infarction, subendocardial infarction, initial episode of care ([place]) 07/21/2012  . Acute osteomyelitis involving ankle  and foot ([place]) 02/06/2015  . Acute systolic heart failure ([place]) 07/21/2012   New onset 07/19/12; admission to [facility] ED. Elevated Troponins.  S/p 2D-echo with EF 20-25%.  S/p cardiac catheterization with stenting LAD.  Repeat 2D-echo 10/2011 with improved EF of 35%.   . Anemia   . Automatic implantable cardioverter-defibrillator in situ    a. MDT CRT-D 06/2014, SN: [id number] H  .removed in feb 2020  . CAD (coronary artery disease)    a. cardiac cath 101/04/2012: PCI/DES to chronically occluded mLAD, consideration PCI to diag branch in 4 weeks.   . Cataract   . Chicken pox   . Chronic kidney disease   . Chronic systolic CHF (congestive heart failure) (HCC)    a. mixed ICM & NICM; b. EF 20-25% by echo 07/2012, mid-dist 2/3 of LV sev HK/AK, mild MR. echo 10/2012: EF 30-35%, sev HK ant-septal & inf walls, GR1DD, mild MR, PASP 33. c. echo 02/2013: EF 30%, GR1DD, mild MR. echo 04/2014: EF 30%, Septal-lat dyssynchrony, global HK, inf AK, GR1DD, mild MR. d. echo 10/2014: EF50-55%, WM nl, GR1DD, septal mild paradox. e. echo 02/2015: EF 50-55%, wm   . Depression   . Heart attack ([place])   . Heel ulcer ([place]) 04/27/2015  . History of blood transfusion ~ 2011   "plasma; had neuropathy; couldn't walk"  . Hypertension   . LBBB (left bundle branch block)   .  Neuromuscular disorder ([place])   . Neuropathy 2011  . Obesity, unspecified   . OSA on CPAP    Moderate with AHI 23/hr and now on CPAP at 16cm H2O.  Her DME is [facility]  . Pure hypercholesterolemia   . Sepsis ([place]) 09/2018  . Type II diabetes mellitus ([place])   . Unspecified vitamin D deficiency     Past Surgical History:  Procedure Laterality Date  . AMPUTATION TOE Right 06/18/2015   Procedure: AMPUTATION TOE;  Surgeon: Samara Deist, DPM;  Location: [facility] ORS;  Service: Podiatry;  Laterality: Right;  . AMPUTATION TOE Left 10/08/2018   Procedure: AMPUTATION TOE LEFT 2ND;  Surgeon: Albertine Patricia, DPM;  Location: [facility] ORS;  Service: Podiatry;  Laterality: Left;   . BACK SURGERY    . BI-VENTRICULAR IMPLANTABLE CARDIOVERTER DEFIBRILLATOR N/A 07/06/2014   Procedure: BI-VENTRICULAR IMPLANTABLE CARDIOVERTER DEFIBRILLATOR  (CRT-D);  Surgeon: Deboraha Sprang, MD;  Location[facility] CATH LAB;  Service: Cardiovascular;  Laterality: N/A;  . BI-VENTRICULAR IMPLANTABLE CARDIOVERTER DEFIBRILLATOR  (CRT-D)  07/06/2014  . BILATERAL OOPHORECTOMY  01/2011   ovarian cyst benign  . BIV ICD INSERTION CRT-D N/A 03/31/2019   Procedure: BIV ICD INSERTION CRT-D;  Surgeon: Constance Haw, MD;  Location: [place] CV LAB;  Service: Cardiovascular;  Laterality: N/A;  . COLONOSCOPY WITH PROPOFOL Left 02/22/2015   Procedure: COLONOSCOPY WITH PROPOFOL;  Surgeon: Hulen Luster, MD;  Location: [facility] ENDOSCOPY;  Service: Endoscopy;  Laterality: Left;  . CORONARY ANGIOPLASTY WITH STENT PLACEMENT Left 07/2012   new onset systolic CHF; elevated troponins.  Cardiac catheterization with stenting to LAD; EF 15%.  2D-echo: EF 20-25%.  . ESOPHAGOGASTRODUODENOSCOPY N/A 02/22/2015   Procedure: ESOPHAGOGASTRODUODENOSCOPY (EGD);  Surgeon: Hulen Luster, MD;  Location: [facility] ENDOSCOPY;  Service: Endoscopy;  Laterality: N/A;  . INCISION AND DRAINAGE ABSCESS Right 2007   groin; with ICU stay due to sepsis.  Marland Kitchen LAPAROSCOPIC CHOLECYSTECTOMY  2011  . LEFT HEART CATH AND CORONARY ANGIOGRAPHY N/A 10/05/2018   Procedure: LEFT HEART CATH AND CORONARY ANGIOGRAPHY;  Surgeon: Minna Merritts, MD;  Location: [place] CV LAB;  Service: Cardiovascular;  Laterality: N/A;  . LEFT HEART CATHETERIZATION WITH CORONARY ANGIOGRAM N/A 07/21/2012   Procedure: LEFT HEART CATHETERIZATION WITH CORONARY ANGIOGRAM;  Surgeon: Jolaine Artist, MD;  Location: [facility] CATH LAB;  Service: Cardiovascular;  Laterality: N/A;  . [place]   L4-5  . PACEMAKER REMOVAL  11/2018   due to infection around pacemaker  . PERCUTANEOUS CORONARY STENT INTERVENTION (PCI-S) N/A 07/23/2012   Procedure: PERCUTANEOUS CORONARY STENT  INTERVENTION (PCI-S);  Surgeon: Sherren Mocha, MD;  Location: [facility] CATH LAB;  Service: Cardiovascular;  Laterality: N/A;  . PERIPHERAL VASCULAR BALLOON ANGIOPLASTY Left 10/06/2018   Procedure: PERIPHERAL VASCULAR BALLOON ANGIOPLASTY;  Surgeon: Katha Cabal, MD;  Location: [place] CV LAB;  Service: Cardiovascular;  Laterality: Left;  . PERIPHERAL VASCULAR CATHETERIZATION N/A 02/10/2015   Procedure: Picc Line Insertion;  Surgeon: Katha Cabal, MD;  Location: [place] CV LAB;  Service: Cardiovascular;  Laterality: N/A;  . RIGHT HEART CATH N/A 03/29/2019   Procedure: RIGHT HEART CATH;  Surgeon: Jolaine Artist, MD;  Location: [place] CV LAB;  Service: Cardiovascular;  Laterality: N/A;  . TEE WITHOUT CARDIOVERSION N/A 10/02/2018   Procedure: TRANSESOPHAGEAL ECHOCARDIOGRAM (TEE);  Surgeon: Wellington Hampshire, MD;  Location: [facility] ORS;  Service: Cardiovascular;  Laterality: N/A;  . [place]  . VAGINAL HYSTERECTOMY  01/2011   Fibroids/DUB.  Ovaries  removed. [place].    Current Meds  Medication Sig  . amiodarone (PACERONE) 200 MG tablet Take 200 mg by mouth daily.  . Calcium Carbonate-Vitamin D3 (CALCIUM 600-D) 600-400 MG-UNIT TABS Take 1 tablet by mouth 2 (two) times a day.  . carvedilol (COREG) 3.125 MG tablet Take 1 tablet (3.125 mg total) by mouth 2 (two) times daily with a meal.  . clopidogrel (PLAVIX) 75 MG tablet TAKE 1 TABLET BY MOUTH ONCE DAILY  . ferrous sulfate 325 (65 FE) MG tablet Take 325 mg by mouth 2 (two) times daily with a meal.  . gabapentin (NEURONTIN) 600 MG tablet TAKE 1 TABLET BY IN THE MORNING AND 2 ATBEDTIME  . hydrALAZINE (APRESOLINE) 25 MG tablet TAKE 1/2 TABLET BY MOUTH 3 TIMES A DAY  . insulin aspart (NOVOLOG) 100 UNIT/ML FlexPen Inject 5 Units into the skin 3 (three) times daily with meals.  . Insulin Glargine (LANTUS SOLOSTAR) 100 UNIT/ML Solostar Pen Inject 22 Units into the skin daily.  . Insulin Pen Needle (PEN NEEDLES) 32G X 6 MM  MISC 1 each by Does not apply route 4 (four) times daily.  . isosorbide mononitrate (IMDUR) 30 MG 24 hr tablet Take 1 tablet (30 mg total) by mouth daily.  Marland Kitchen levothyroxine (SYNTHROID) 50 MCG tablet TAKE 1 TABLET BY MOUTH ONCE DAILY BEFOREBREAKFAST  . losartan (COZAAR) 25 MG tablet Take 0.5 tablets (12.5 mg total) by mouth daily.  . Multiple Vitamin (MULTIVITAMIN) tablet Take 1 tablet by mouth daily.  . Omega-3 Fatty Acids (FISH OIL) 1000 MG CAPS Take 1 capsule by mouth 2 (two) times a day.   Glory Rosebush ULTRA test strip UUD TO CHECK BLOOD SUGAR 3 TIMES DAILY  . rosuvastatin (CRESTOR) 10 MG tablet Take 1 tablet (10 mg total) by mouth daily.  Marland Kitchen torsemide (DEMADEX) 20 MG tablet TAKE 4 TABLETS (80 MG TOTAL) IN THE MORNING AND 2 TABS (40 MG) IN THE AFTERNOON.  . traMADol (ULTRAM) 50 MG tablet TAKE 1 TABLET BY MOUTH EVERY 6 HOURS AS NEEDED    No Known Allergies    Review of Systems negative except from HPI and PMH  Physical Exam BP 100/60 (BP Location: Left Arm, Patient Position: Sitting, Cuff Size: Large)   Pulse 60   Ht 5\' 8"  (1.727 m)   Wt 260 lb 4 oz (118 kg)   SpO2 97%   BMI 39.57 kg/m  Well developed and well nourished in no acute distress HENT normal Neck supple with JVP-5-6 Clear Device pocket well healed; without hematoma or erythema.  There is no tethering  Regular rate and rhythm, no *  murmur Abd-soft with active BS No Clubbing cyanosis ttr edema Skin-warm and dry A & Oriented  Grossly normal sensory and motor function  ECG sinus with P synchronous pacing with a negative QRS lead I and RS lead V1    Assessment and  Plan  CRT-D recent reimplantation following extraction  Nonischemic myopathy  Coronary artery disease  Congestive heart failure-chronic-systolic class II  Renal insufficiency grade 3   Hypothyroidism  High Risk Medication Surveillance  PVCs  Euvolemic continue current meds   Without symptoms of ischemia\  Tolerating amio; needs amiodarone  surveillance laboratories.  Hypothyroid at last check.  Suspect she will need more thyroid replacement  Infrequent atrial tachycardia; we will anticipate decreasing amiodarone at next visit to 1000 mg a week if no increase in atrial tachycardia burden   We will refill her nitroglycerin SL

## 2020-03-28 NOTE — Patient Instructions (Signed)
Medication Instructions:  - Your physician recommends that you continue on your current medications as directed. Please refer to the Current Medication list given to you today.  *If you need a refill on your cardiac medications before your next appointment, please call your pharmacy*   Lab Work: -Your physician recommends that you have lab work today: TSH/ Liver  If you have labs (blood work) drawn today and your tests are completely normal, you will receive your results only by: . MyChart Message (if you have MyChart) OR . A paper copy in the mail If you have any lab test that is abnormal or we need to change your treatment, we will call you to review the results.   Testing/Procedures: - none ordered   Follow-Up: At [facility], you and your health needs are our priority.  As part of our continuing mission to provide you with exceptional heart care, we have created designated Provider Care Teams.  These Care Teams include your primary Cardiologist (physician) and Advanced Practice Providers (APPs -  Physician Assistants and Nurse Practitioners) who all work together to provide you with the care you need, when you need it.  We recommend signing up for the patient portal called "MyChart".  Sign up information is provided on this After Visit Summary.  MyChart is used to connect with patients for Virtual Visits (Telemedicine).  Patients are able to view lab/test results, encounter notes, upcoming appointments, etc.  Non-urgent messages can be sent to your provider as well.   To learn more about what you can do with MyChart, go to [email or web address].    Your next appointment:   6 month(s)  The format for your next appointment:   In Person  Provider:   Steven Klein, MD   Other Instructions n/a  

## 2020-03-29 ENCOUNTER — Other Ambulatory Visit: Payer: Self-pay | Admitting: Family Medicine

## 2020-03-29 LAB — HEPATIC FUNCTION PANEL
ALT: 22 IU/L (ref 0–32)
AST: 26 IU/L (ref 0–40)
Albumin: 4.3 g/dL (ref 3.8–4.8)
Alkaline Phosphatase: 107 IU/L (ref 48–121)
Bilirubin Total: 0.3 mg/dL (ref 0.0–1.2)
Bilirubin, Direct: 0.13 mg/dL (ref 0.00–0.40)
Total Protein: 7.1 g/dL (ref 6.0–8.5)

## 2020-03-29 LAB — TSH: TSH: 3.69 u[IU]/mL (ref 0.450–4.500)

## 2020-03-29 NOTE — Telephone Encounter (Signed)
Requested Prescriptions  Pending Prescriptions Disp Refills   gabapentin (NEURONTIN) 600 MG tablet [Pharmacy Med Name: GABAPENTIN 600 MG TAB] 270 tablet 1    Sig: TAKE 1 TABLET BY IN THE MORNING AND 2 ATBEDTIME     Neurology: Anticonvulsants - gabapentin Passed - 03/29/2020  1:18 PM      Passed - Valid encounter within last 12 months    Recent Outpatient Visits          3 months ago Diarrhea of presumed infectious origin   Primary Care at [facility], Lilia Argue, MD   6 months ago DM type 2, uncontrolled, with renal complications [facility])   [facility] at [facility], Lilia Argue, MD   11 months ago Diabetic peripheral neuropathy associated with type 2 diabetes mellitus [facility])   Primary Care at [facility], Lilia Argue, MD   1 year ago Acute on chronic systolic CHF (congestive heart failure) [facility])   Primary Care at [facility], Lilia Argue, MD   1 year ago Acute on chronic systolic CHF (congestive heart failure) [facility])   [facility] at [facility], Lilia Argue, MD

## 2020-03-30 ENCOUNTER — Ambulatory Visit (INDEPENDENT_AMBULATORY_CARE_PROVIDER_SITE_OTHER): Payer: PPO | Admitting: *Deleted

## 2020-03-30 DIAGNOSIS — I471 Supraventricular tachycardia: Secondary | ICD-10-CM

## 2020-03-30 LAB — CUP PACEART REMOTE DEVICE CHECK
Battery Remaining Longevity: 86 mo
Battery Voltage: 2.99 V
Brady Statistic AP VP Percent: 0.55 %
Brady Statistic AP VS Percent: 0.01 %
Brady Statistic AS VP Percent: 99.41 %
Brady Statistic AS VS Percent: 0.03 %
Brady Statistic RA Percent Paced: 0.56 %
Brady Statistic RV Percent Paced: 99.73 %
Date Time Interrogation Session: 20210624001603
HighPow Impedance: 51 Ohm
Implantable Lead Implant Date: 20200625
Implantable Lead Implant Date: 20200625
Implantable Lead Implant Date: 20200625
Implantable Lead Location: 753858
Implantable Lead Location: 753859
Implantable Lead Location: 753860
Implantable Lead Model: 4398
Implantable Lead Model: 5076
Implantable Lead Model: 6935
Implantable Pulse Generator Implant Date: 20200625
Lead Channel Impedance Value: 198.837
Lead Channel Impedance Value: 205.2 Ohm
Lead Channel Impedance Value: 208.174
Lead Channel Impedance Value: 246.635
Lead Channel Impedance Value: 250.943
Lead Channel Impedance Value: 342 Ohm
Lead Channel Impedance Value: 361 Ohm
Lead Channel Impedance Value: 399 Ohm
Lead Channel Impedance Value: 456 Ohm
Lead Channel Impedance Value: 475 Ohm
Lead Channel Impedance Value: 513 Ohm
Lead Channel Impedance Value: 532 Ohm
Lead Channel Impedance Value: 722 Ohm
Lead Channel Impedance Value: 722 Ohm
Lead Channel Impedance Value: 760 Ohm
Lead Channel Impedance Value: 817 Ohm
Lead Channel Impedance Value: 836 Ohm
Lead Channel Impedance Value: 893 Ohm
Lead Channel Pacing Threshold Amplitude: 0.75 V
Lead Channel Pacing Threshold Amplitude: 0.875 V
Lead Channel Pacing Threshold Amplitude: 2 V
Lead Channel Pacing Threshold Pulse Width: 0.4 ms
Lead Channel Pacing Threshold Pulse Width: 0.4 ms
Lead Channel Pacing Threshold Pulse Width: 0.6 ms
Lead Channel Sensing Intrinsic Amplitude: 0.625 mV
Lead Channel Sensing Intrinsic Amplitude: 0.625 mV
Lead Channel Sensing Intrinsic Amplitude: 16.625 mV
Lead Channel Sensing Intrinsic Amplitude: 16.625 mV
Lead Channel Setting Pacing Amplitude: 2 V
Lead Channel Setting Pacing Amplitude: 2 V
Lead Channel Setting Pacing Amplitude: 2.25 V
Lead Channel Setting Pacing Pulse Width: 0.4 ms
Lead Channel Setting Pacing Pulse Width: 0.6 ms
Lead Channel Setting Sensing Sensitivity: 0.3 mV

## 2020-03-31 ENCOUNTER — Other Ambulatory Visit: Payer: Self-pay | Admitting: Family Medicine

## 2020-03-31 NOTE — Progress Notes (Signed)
Remote ICD transmission.   

## 2020-03-31 NOTE — Telephone Encounter (Signed)
Requested Prescriptions  Pending Prescriptions Disp Refills  . levothyroxine (SYNTHROID) 50 MCG tablet [Pharmacy Med Name: LEVOTHYROXINE SODIUM 50 MCG TAB] 30 tablet 0    Sig: TAKE 1 TABLET BY MOUTH ONCE DAILY BEFOREBREAKFAST     Endocrinology:  Hypothyroid Agents Failed - 03/31/2020  9:14 AM      Failed - TSH needs to be rechecked within 3 months after an abnormal result. Refill until TSH is due.      Passed - TSH in normal range and within 360 days    TSH  Date Value Ref Range Status  03/28/2020 3.690 0.450 - 4.500 uIU/mL Final         Passed - Valid encounter within last 12 months    Recent Outpatient Visits          3 months ago Diarrhea of presumed infectious origin   Primary Care at [facility], Lilia Argue, MD   6 months ago DM type 2, uncontrolled, with renal complications [facility])   [facility] at [facility], Lilia Argue, MD   11 months ago Diabetic peripheral neuropathy associated with type 2 diabetes mellitus [facility])   [facility] at [facility], Lilia Argue, MD   1 year ago Acute on chronic systolic CHF (congestive heart failure) [facility])   [facility] at [facility], Lilia Argue, MD   1 year ago Acute on chronic systolic CHF (congestive heart failure) [facility])   [facility] Care at [facility], Lilia Argue, MD

## 2020-04-04 NOTE — Telephone Encounter (Signed)
   Primary Cardiologist: Ida Rogue, MD  Chart reviewed as part of pre-operative protocol coverage. It appears that when clearance was originally received, the patient had a pending appointment with primary cardiologist Dr. Rockey Situ but she cancelled this visit. She has complex PMH including CAD and prior MI, systolic CHF (mixed), CAD, CRT-D with previous issues with bacteremia requiring extraction of device then reimplatation, CKD, depression, LBBB, obesity, OSA, HLD, sepsis, DM, vitamin D deficiency, atrial tachycardia requiring amiodarone. Mild SOB noted at last OV, but felt to be euvolemic without symptoms of ischemia. Last left heart cath appears to have been in 2019 in context of elevated troponin without PCI at that time.   She did, however, just see Dr. Caryl Comes a few days ago. Will route to Dr. Tomma Lightning. Rockey Situ for input on whether patient can be cleared based on this encounter and if she is OK to hold Plavix as requested below for eye surgery. She is not on aspirin. Please route response to P [facility] PREOP (the pre-op pool). Thank you.  Will also route to callback team to make them aware patient missed her pre-op appointment, hence the delay.   Charlie Pitter, PA-C 04/04/2020, 11:30 AM

## 2020-04-04 NOTE — Telephone Encounter (Signed)
[name] from [facility] called and wanted to check on the status of this clearance. The patient has been scheduled for her procedure on 04/17/20. Please call or fax the updated clearance status to the office

## 2020-04-05 NOTE — Telephone Encounter (Signed)
My suggestion would be to hold the Plavix 5 days before the procedure When not taking the Plavix would take aspirin 81 mg daily Following procedure could switch back to Plavix and hold the aspirin

## 2020-04-06 NOTE — Telephone Encounter (Signed)
   Primary Cardiologist: Ida Rogue, MD  Chart reviewed as part of pre-operative protocol coverage. Given past medical history and time since last visit, based on ACC/AHA guidelines, [name] would be at acceptable risk for the planned procedure without further cardiovascular testing.   She may hold her Plavix for 5 days prior to procedure.  While she is holding her Plavix we would like her to take 81 mg of aspirin daily.  Following the procedure we would like her to stop aspirin and resume Plavix.  I will route this recommendation to the requesting party via Epic fax function and remove from pre-op pool.  Please call with questions.  Jossie Ng. [name] NP-C    04/06/2020, 7:26 AM [place] [address] Office [phone] Fax [phone]

## 2020-04-07 ENCOUNTER — Other Ambulatory Visit: Payer: Self-pay | Admitting: Family Medicine

## 2020-04-10 ENCOUNTER — Other Ambulatory Visit: Payer: Self-pay | Admitting: Family Medicine

## 2020-04-11 NOTE — Telephone Encounter (Signed)
pmp reviewed  Med refilled 

## 2020-04-11 NOTE — Telephone Encounter (Signed)
Requested medication (s) are due for refill today: yes  Requested medication (s) are on the active medication list: yes  Last refill:  02/21/2020  Future visit scheduled: no  Notes to clinic: this refill cannot be delegated    Requested Prescriptions  Pending Prescriptions Disp Refills   traMADol (ULTRAM) 50 MG tablet [Pharmacy Med Name: TRAMADOL HCL 50 MG TAB] 120 tablet     Sig: TAKE 1 TABLET BY MOUTH EVERY 6 HOURS AS NEEDED      Not Delegated - Analgesics:  Opioid Agonists Failed - 04/10/2020  1:32 PM      Failed - This refill cannot be delegated      Failed - Urine Drug Screen completed in last 360 days.      Passed - Valid encounter within last 6 months    Recent Outpatient Visits           4 months ago Diarrhea of presumed infectious origin   Primary Care at [facility], Lilia Argue, MD   7 months ago DM type 2, uncontrolled, with renal complications [facility])   [facility] at [facility], Lilia Argue, MD   12 months ago Diabetic peripheral neuropathy associated with type 2 diabetes mellitus [facility])   [facility] at [facility], Lilia Argue, MD   1 year ago Acute on chronic systolic CHF (congestive heart failure) [facility])   [facility] at [facility], Lilia Argue, MD   1 year ago Acute on chronic systolic CHF (congestive heart failure) [facility])   [facility] at [facility], Lilia Argue, MD

## 2020-04-17 DIAGNOSIS — H35372 Puckering of macula, left eye: Secondary | ICD-10-CM | POA: Diagnosis not present

## 2020-04-17 DIAGNOSIS — G4733 Obstructive sleep apnea (adult) (pediatric): Secondary | ICD-10-CM | POA: Diagnosis not present

## 2020-04-17 DIAGNOSIS — E113312 Type 2 diabetes mellitus with moderate nonproliferative diabetic retinopathy with macular edema, left eye: Secondary | ICD-10-CM | POA: Diagnosis not present

## 2020-04-18 DIAGNOSIS — H35372 Puckering of macula, left eye: Secondary | ICD-10-CM | POA: Diagnosis not present

## 2020-04-18 DIAGNOSIS — E113312 Type 2 diabetes mellitus with moderate nonproliferative diabetic retinopathy with macular edema, left eye: Secondary | ICD-10-CM | POA: Diagnosis not present

## 2020-04-25 ENCOUNTER — Other Ambulatory Visit (HOSPITAL_COMMUNITY): Payer: Self-pay | Admitting: Adult Health

## 2020-04-25 DIAGNOSIS — H35372 Puckering of macula, left eye: Secondary | ICD-10-CM | POA: Diagnosis not present

## 2020-04-25 DIAGNOSIS — E113313 Type 2 diabetes mellitus with moderate nonproliferative diabetic retinopathy with macular edema, bilateral: Secondary | ICD-10-CM | POA: Diagnosis not present

## 2020-04-27 ENCOUNTER — Other Ambulatory Visit: Payer: Self-pay | Admitting: Cardiovascular Disease

## 2020-05-01 ENCOUNTER — Other Ambulatory Visit: Payer: Self-pay | Admitting: Family Medicine

## 2020-05-02 ENCOUNTER — Telehealth: Payer: Self-pay | Admitting: Internal Medicine

## 2020-05-02 NOTE — Telephone Encounter (Signed)
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  Would you recommend antibiotics prior to tooth extraction ?  Kerin Ransom PA-C 05/02/2020 4:36 PM

## 2020-05-02 NOTE — Telephone Encounter (Signed)
   [facility] Pre-operative Risk Assessment    HEARTCARE STAFF: - Please ensure there is not already an duplicate clearance open for this procedure. - Under Visit Info/Reason for Call, type in Other and utilize the format Clearance MM/DD/YY or Clearance TBD. Do not use dashes or single digits. - If request is for dental extraction, please clarify the # of teeth to be extracted.  Request for surgical clearance:  1. What type of surgery is being performed? Potential tooth extraction of 1 tooth  2. When is this surgery scheduled? 7/28 at 11:50 am  3. What type of clearance is required (medical clearance vs. Pharmacy clearance to hold med vs. Both)? pharmacy  4. Are there any medications that need to be held prior to surgery and how long?plavix  5. Practice name and name of physician performing surgery? [facility] Dr. Eber Jones  6. What is the office phone number? [phone]   7.   What is the office fax number? [phone]  8.   Anesthesia type (None, local, MAC, general) ? local   Marykay Lex 05/02/2020, 4:10 PM  _________________________________________________________________   (provider comments below)

## 2020-05-03 MED ORDER — AMOXICILLIN 500 MG PO CAPS: ORAL_CAPSULE | ORAL | 3 refills | Status: DC

## 2020-05-03 NOTE — Telephone Encounter (Signed)
I called and spoke with the patient and instructed her to go ahead and take the Amoxacillin that was called in for her yesterday.  Kerin Ransom PA-C 05/03/2020 1:53 PM

## 2020-05-03 NOTE — Telephone Encounter (Signed)
Pre op cardiology clearance request came in 05/02/2020 at 4:10 pm. Pharmacy recommendation for pre op antibiotics came in 7/28 at 12:55 pm.  The patient had her tooth pull this morning (7/28) at 11:55 am.  I spoke with dentist office who noted that no SBE prophylaxis was recommended prior to her previous dental extraction in Oct 2020.  I called the patient and left a message to call back, I will suggest she take the Amoxacillin that was called in today.   Kerin Ransom PA-C 05/03/2020 1:29 PM

## 2020-05-03 NOTE — Telephone Encounter (Addendum)
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 Please advise pt that rx for amoxicillin has been sent to her local pharmacy. She does not need to hold Plavix for potential single dental extraction (also has history of stroke).

## 2020-05-04 ENCOUNTER — Other Ambulatory Visit: Payer: Self-pay | Admitting: Family Medicine

## 2020-05-04 NOTE — Telephone Encounter (Signed)
Requested medication (s) are due for refill today: Yes  Requested medication (s) are on the active medication list: Yes  Last refill:  01/28/20  Future visit scheduled: No  Notes to clinic:  Labs due.    Requested Prescriptions  Pending Prescriptions Disp Refills   clopidogrel (PLAVIX) 75 MG tablet [Pharmacy Med Name: CLOPIDOGREL BISULFATE 75 MG TAB] 90 tablet 0    Sig: TAKE 1 TABLET BY MOUTH ONCE DAILY      Hematology: Antiplatelets - clopidogrel Failed - 05/04/2020 11:33 AM      Failed - Evaluate AST, ALT within 2 months of therapy initiation.      Failed - HCT in normal range and within 180 days    Hematocrit  Date Value Ref Range Status  09/06/2019 33.6 (L) 34.0 - 46.6 % Final          Failed - HGB in normal range and within 180 days    Hemoglobin  Date Value Ref Range Status  09/06/2019 11.2 11.1 - 15.9 g/dL Final          Failed - PLT in normal range and within 180 days    Platelets  Date Value Ref Range Status  09/06/2019 196 150 - 450 x10E3/uL Final   Platelet Count, POC  Date Value Ref Range Status  02/24/2018 191 142 - 424 K/uL Final          Passed - ALT in normal range and within 360 days    ALT  Date Value Ref Range Status  03/28/2020 22 0 - 32 IU/L Final          Passed - AST in normal range and within 360 days    AST  Date Value Ref Range Status  03/28/2020 26 0 - 40 IU/L Final          Passed - Valid encounter within last 6 months    Recent Outpatient Visits           5 months ago Diarrhea of presumed infectious origin   Primary Care at [facility], Lilia Argue, MD   8 months ago DM type 2, uncontrolled, with renal complications [facility])   [facility] at [facility], Lilia Argue, MD   1 year ago Diabetic peripheral neuropathy associated with type 2 diabetes mellitus [facility])   [facility] at [facility], Lilia Argue, MD   1 year ago Acute on chronic systolic CHF (congestive heart failure) [facility])   [facility] Care at [facility], Lilia Argue, MD    1 year ago Acute on chronic systolic CHF (congestive heart failure) [facility])   [facility] at [facility], Lilia Argue, MD

## 2020-05-09 ENCOUNTER — Telehealth: Payer: Self-pay | Admitting: Internal Medicine

## 2020-05-09 NOTE — Telephone Encounter (Signed)
Pt c/o BP issue: STAT if pt c/o blurred vision, one-sided weakness or slurred speech  1. What are your last 5 BP readings? BP: 110/64 HR:62 (sitting) BP: 84/44 HR:60 (standing) 2. Are you having any other symptoms (ex. Dizziness, headache, blurred vision, passed out)? Patient reports dizziness, per Joycelyn Schmid with [facility]  3. What is your BP issue? Margaret with [facility] is calling to make Dr. Caryl Comes aware of patient's BP. She states she advised that the patient increase her fluid intake, also incorporating beverages with electrolytes. Joycelyn Schmid assumes Torsemide medication may be the cause of BP drop. However, she would like a call back from Dr. Olin Pia nurse to further discuss.

## 2020-05-09 NOTE — Telephone Encounter (Signed)
Attempted to call [name] back to discuss x 2 attempts. Message states the call cannot be completed at this time.  Will forward to triage to try to attempt to call [name] back tomorrow as I will be out the rest of the week.

## 2020-05-10 NOTE — Telephone Encounter (Signed)
Attempted to call Joycelyn Schmid at [facility]. Message sts that the call cannot be completed at this time.

## 2020-05-12 NOTE — Telephone Encounter (Signed)
Spoke with patient and she states that she is doing much better now. Advised that I was calling because this lady from [facility] had called and we were not able to reach her back to discuss. So wanted to call patient to see how she is doing and she states that everything is back to normal now. Closing encounter and instructed patient to call back if any further questions or concerns. She verbalized understanding.

## 2020-05-16 ENCOUNTER — Other Ambulatory Visit (HOSPITAL_COMMUNITY): Payer: Self-pay | Admitting: Internal Medicine

## 2020-05-16 ENCOUNTER — Other Ambulatory Visit (HOSPITAL_COMMUNITY): Payer: Self-pay | Admitting: Adult Health

## 2020-05-16 ENCOUNTER — Other Ambulatory Visit: Payer: Self-pay | Admitting: Family Medicine

## 2020-05-16 DIAGNOSIS — H35372 Puckering of macula, left eye: Secondary | ICD-10-CM | POA: Diagnosis not present

## 2020-05-16 DIAGNOSIS — E113312 Type 2 diabetes mellitus with moderate nonproliferative diabetic retinopathy with macular edema, left eye: Secondary | ICD-10-CM | POA: Diagnosis not present

## 2020-05-17 ENCOUNTER — Telehealth: Payer: Self-pay | Admitting: Nurse Practitioner

## 2020-05-17 NOTE — Telephone Encounter (Signed)
Spoke with patient to see if we could reschedule the 05/29/20 Palliative f/u visit to 06/05/20 @ 4 PM and she was in agreement with this.

## 2020-05-24 DIAGNOSIS — I129 Hypertensive chronic kidney disease with stage 1 through stage 4 chronic kidney disease, or unspecified chronic kidney disease: Secondary | ICD-10-CM | POA: Diagnosis not present

## 2020-05-24 DIAGNOSIS — N2581 Secondary hyperparathyroidism of renal origin: Secondary | ICD-10-CM | POA: Diagnosis not present

## 2020-05-24 DIAGNOSIS — N184 Chronic kidney disease, stage 4 (severe): Secondary | ICD-10-CM | POA: Diagnosis not present

## 2020-05-24 DIAGNOSIS — E1122 Type 2 diabetes mellitus with diabetic chronic kidney disease: Secondary | ICD-10-CM | POA: Diagnosis not present

## 2020-05-24 DIAGNOSIS — R768 Other specified abnormal immunological findings in serum: Secondary | ICD-10-CM | POA: Diagnosis not present

## 2020-05-24 DIAGNOSIS — D631 Anemia in chronic kidney disease: Secondary | ICD-10-CM | POA: Diagnosis not present

## 2020-05-24 DIAGNOSIS — E875 Hyperkalemia: Secondary | ICD-10-CM | POA: Diagnosis not present

## 2020-05-29 ENCOUNTER — Other Ambulatory Visit: Payer: PPO | Admitting: Nurse Practitioner

## 2020-06-05 ENCOUNTER — Other Ambulatory Visit: Payer: PPO | Admitting: Nurse Practitioner

## 2020-06-05 ENCOUNTER — Other Ambulatory Visit: Payer: Self-pay

## 2020-06-13 DIAGNOSIS — E113391 Type 2 diabetes mellitus with moderate nonproliferative diabetic retinopathy without macular edema, right eye: Secondary | ICD-10-CM | POA: Diagnosis not present

## 2020-06-13 DIAGNOSIS — E113312 Type 2 diabetes mellitus with moderate nonproliferative diabetic retinopathy with macular edema, left eye: Secondary | ICD-10-CM | POA: Diagnosis not present

## 2020-06-15 ENCOUNTER — Other Ambulatory Visit: Payer: Self-pay | Admitting: Family Medicine

## 2020-06-15 NOTE — Telephone Encounter (Signed)
Requested medication (s) are due for refill today -yes  Requested medication (s) are on the active medication list -yes  Future visit scheduled -no  Last refill: 04/11/20  Notes to clinic: Request for non delegated Rx  Requested Prescriptions  Pending Prescriptions Disp Refills   traMADol (ULTRAM) 50 MG tablet [Pharmacy Med Name: TRAMADOL HCL 50 MG TAB] 120 tablet     Sig: TAKE 1 TABLET BY MOUTH EVERY 6 HOURS AS NEEDED      Not Delegated - Analgesics:  Opioid Agonists Failed - 06/15/2020  1:06 PM      Failed - This refill cannot be delegated      Failed - Urine Drug Screen completed in last 360 days.      Failed - Valid encounter within last 6 months    Recent Outpatient Visits           6 months ago Diarrhea of presumed infectious origin   Primary Care at [facility], Lilia Argue, MD   9 months ago DM type 2, uncontrolled, with renal complications [facility])   [facility] at [facility], Lilia Argue, MD   1 year ago Diabetic peripheral neuropathy associated with type 2 diabetes mellitus [facility])   [facility] at [facility], Lilia Argue, MD   1 year ago Acute on chronic systolic CHF (congestive heart failure) [facility])   [facility] at [facility], Lilia Argue, MD   1 year ago Acute on chronic systolic CHF (congestive heart failure) [facility])   [facility] at [facility], Lilia Argue, MD                  Requested Prescriptions  Pending Prescriptions Disp Refills   traMADol (ULTRAM) 50 MG tablet [Pharmacy Med Name: TRAMADOL HCL 50 MG TAB] 120 tablet     Sig: TAKE 1 TABLET BY MOUTH EVERY 6 HOURS AS NEEDED      Not Delegated - Analgesics:  Opioid Agonists Failed - 06/15/2020  1:06 PM      Failed - This refill cannot be delegated      Failed - Urine Drug Screen completed in last 360 days.      Failed - Valid encounter within last 6 months    Recent Outpatient Visits           6 months ago Diarrhea of presumed infectious origin   Primary Care at [facility], Lilia Argue, MD   9  months ago DM type 2, uncontrolled, with renal complications [facility])   [facility] at [facility], Lilia Argue, MD   1 year ago Diabetic peripheral neuropathy associated with type 2 diabetes mellitus [facility])   [facility] at [facility], Lilia Argue, MD   1 year ago Acute on chronic systolic CHF (congestive heart failure) [facility])   [facility] at [facility], Lilia Argue, MD   1 year ago Acute on chronic systolic CHF (congestive heart failure) [facility])   Primary Care at [facility], Lilia Argue, MD

## 2020-06-16 NOTE — Telephone Encounter (Signed)
Refillled tramadol, but patient needs to establish with new PCP. She needs a 40 min appt. thanks

## 2020-06-16 NOTE — Telephone Encounter (Signed)
Patient is requesting a refill of the following medications: Requested Prescriptions   Pending Prescriptions Disp Refills   traMADol (ULTRAM) 50 MG tablet [Pharmacy Med Name: TRAMADOL HCL 50 MG TAB] 120 tablet     Sig: TAKE 1 TABLET BY MOUTH EVERY 6 HOURS AS NEEDED    Date of patient request: 06/15/2020 Last office visit: 09/06/2019 Date of last refill: 04/11/2020 Last refill amount: 120tab Follow up time period per chart: 9mo (11/2019)

## 2020-06-16 NOTE — Telephone Encounter (Signed)
pmp reviewd, appropriate meds refilled 

## 2020-06-18 ENCOUNTER — Emergency Department
Admission: EM | Admit: 2020-06-18 | Discharge: 2020-06-18 | Disposition: A | Discharge status: Home / Self Care | Payer: PPO | Attending: Emergency Medicine | Admitting: Emergency Medicine

## 2020-06-18 ENCOUNTER — Other Ambulatory Visit: Payer: Self-pay

## 2020-06-18 ENCOUNTER — Emergency Department: Payer: PPO

## 2020-06-18 DIAGNOSIS — Z8673 Personal history of transient ischemic attack (TIA), and cerebral infarction without residual deficits: Secondary | ICD-10-CM | POA: Insufficient documentation

## 2020-06-18 DIAGNOSIS — M1711 Unilateral primary osteoarthritis, right knee: Secondary | ICD-10-CM | POA: Diagnosis not present

## 2020-06-18 DIAGNOSIS — E1122 Type 2 diabetes mellitus with diabetic chronic kidney disease: Secondary | ICD-10-CM | POA: Diagnosis not present

## 2020-06-18 DIAGNOSIS — Z79899 Other long term (current) drug therapy: Secondary | ICD-10-CM | POA: Insufficient documentation

## 2020-06-18 DIAGNOSIS — M25561 Pain in right knee: Secondary | ICD-10-CM | POA: Diagnosis not present

## 2020-06-18 DIAGNOSIS — I252 Old myocardial infarction: Secondary | ICD-10-CM | POA: Diagnosis not present

## 2020-06-18 DIAGNOSIS — I130 Hypertensive heart and chronic kidney disease with heart failure and stage 1 through stage 4 chronic kidney disease, or unspecified chronic kidney disease: Secondary | ICD-10-CM | POA: Diagnosis not present

## 2020-06-18 DIAGNOSIS — I2510 Atherosclerotic heart disease of native coronary artery without angina pectoris: Secondary | ICD-10-CM | POA: Insufficient documentation

## 2020-06-18 DIAGNOSIS — E1140 Type 2 diabetes mellitus with diabetic neuropathy, unspecified: Secondary | ICD-10-CM | POA: Insufficient documentation

## 2020-06-18 DIAGNOSIS — I4891 Unspecified atrial fibrillation: Secondary | ICD-10-CM | POA: Diagnosis not present

## 2020-06-18 DIAGNOSIS — Z794 Long term (current) use of insulin: Secondary | ICD-10-CM | POA: Insufficient documentation

## 2020-06-18 DIAGNOSIS — Z7902 Long term (current) use of antithrombotics/antiplatelets: Secondary | ICD-10-CM | POA: Insufficient documentation

## 2020-06-18 DIAGNOSIS — I5043 Acute on chronic combined systolic (congestive) and diastolic (congestive) heart failure: Secondary | ICD-10-CM | POA: Diagnosis not present

## 2020-06-18 DIAGNOSIS — N189 Chronic kidney disease, unspecified: Secondary | ICD-10-CM | POA: Insufficient documentation

## 2020-06-18 DIAGNOSIS — Z955 Presence of coronary angioplasty implant and graft: Secondary | ICD-10-CM | POA: Insufficient documentation

## 2020-06-18 DIAGNOSIS — I708 Atherosclerosis of other arteries: Secondary | ICD-10-CM | POA: Diagnosis not present

## 2020-06-18 MED ORDER — TRAMADOL HCL 50 MG PO TABS
50.0000 mg | ORAL_TABLET | Freq: Once | ORAL | Status: AC
  Administered 2020-06-18: 50 mg via ORAL
  Filled 2020-06-18: qty 1

## 2020-06-18 MED ORDER — TRAMADOL HCL 50 MG PO TABS: 50.0000 mg | ORAL_TABLET | Freq: Four times a day (QID) | ORAL | 0 refills | Status: DC | PRN

## 2020-06-18 NOTE — ED Triage Notes (Signed)
Pt arrived via POV with c/o R knee pain, states on Friday she felt a pop and today she is having difficulty bearing weight without pain.

## 2020-06-18 NOTE — ED Provider Notes (Signed)
[facility] Emergency Department Provider Note ____________________________________________  Time seen: Approximately 8:54 PM  I have reviewed the triage vital signs and the nursing notes.   HISTORY  Chief Complaint Knee Pain    HPI [name] is a 64 y.o. female who presents to the emergency department for evaluation and treatment of right knee pain.  She felt a pop in her knee on Friday and continues to have pain and difficulty bearing weight.  She uses a walker at baseline.  No relief with Tylenol.   Past Medical History:  Diagnosis Date  . Acute myocardial infarction, subendocardial infarction, initial episode of care ([place]) 07/21/2012  . Acute osteomyelitis involving ankle and foot ([place]) 02/06/2015  . Acute systolic heart failure ([place]) 07/21/2012   New onset 07/19/12; admission to [facility] ED. Elevated Troponins.  S/p 2D-echo with EF 20-25%.  S/p cardiac catheterization with stenting LAD.  Repeat 2D-echo 10/2011 with improved EF of 35%.   . Anemia   . Automatic implantable cardioverter-defibrillator in situ    a. MDT CRT-D 06/2014, SN: [id number]  .removed in feb 2020  . CAD (coronary artery disease)    a. cardiac cath 101/04/2012: PCI/DES to chronically occluded mLAD, consideration PCI to diag branch in 4 weeks.   . Cataract   . Chicken pox   . Chronic kidney disease   . Chronic systolic CHF (congestive heart failure) (HCC)    a. mixed ICM & NICM; b. EF 20-25% by echo 07/2012, mid-dist 2/3 of LV sev HK/AK, mild MR. echo 10/2012: EF 30-35%, sev HK ant-septal & inf walls, GR1DD, mild MR, PASP 33. c. echo 02/2013: EF 30%, GR1DD, mild MR. echo 04/2014: EF 30%, Septal-lat dyssynchrony, global HK, inf AK, GR1DD, mild MR. d. echo 10/2014: EF50-55%, WM nl, GR1DD, septal mild paradox. e. echo 02/2015: EF 50-55%, wm   . Depression   . Heart attack ([place])   . Heel ulcer ([place]) 04/27/2015  . History of blood transfusion ~ 2011   "plasma; had neuropathy; couldn't walk"   . Hypertension   . LBBB (left bundle branch block)   . Neuromuscular disorder ([place])   . Neuropathy 2011  . Obesity, unspecified   . OSA on CPAP    Moderate with AHI 23/hr and now on CPAP at 16cm H2O.  Her DME is [facility]  . Pure hypercholesterolemia   . Sepsis ([place]) 09/2018  . Type II diabetes mellitus ([place])   . Unspecified vitamin D deficiency     Patient Active Problem List   Diagnosis Date Noted  . Secondary hyperparathyroidism of renal origin ([place]) 07/20/2019  . Hyperkalemia 07/20/2019  . Benign hypertensive kidney disease with chronic kidney disease 07/20/2019  . Anemia in chronic kidney disease 07/20/2019  . Positive antinuclear antibody 07/20/2019  . Proteinuria 07/20/2019  . Acute on chronic systolic (congestive) heart failure ([place]) 03/23/2019  . UTI (urinary tract infection) 03/09/2019  . CAD (coronary artery disease) 03/09/2019  . DM type 2, uncontrolled, with renal complications ([place]) 53/29/9242  . Atrial tachycardia ([place])   . Acute kidney injury superimposed on chronic kidney disease ([place])   . AKI (acute kidney injury) ([place])   . Weakness generalized 12/22/2018  . Shortness of breath 12/22/2018  . Palliative care encounter 12/22/2018  . Endocarditis 12/14/2018  . NSTEMI (non-ST elevated myocardial infarction) ([place]) 11/22/2018  . Acute on chronic combined systolic and diastolic CHF (congestive heart failure) ([place]) 10/28/2018  . Lymphedema 10/28/2018  . Severe sepsis ([place]) 09/28/2018  . History of  amputation of lesser toe of right foot ([place]) 04/19/2018  . Presence of cardiac resynchronization therapy defibrillator (CRT-D) 04/19/2018  . Paraparesis of both lower limbs ([place]) 03/29/2018  . 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 Paroxysmal atrial fibrillation ([place]) 03/29/2018  . Class 3 obesity due to excess calories with serious comorbidity and body mass index (BMI) of 40.0 to 44.9 in adult 09/02/2016  . History of CVA (cerebrovascular accident) 11/11/2015  . Chronic  renal insufficiency 08/04/2015  . OSA (obstructive sleep apnea) 08/15/2014  . Morbid obesity ([place]) 10/26/2013  . Essential hypertension, benign 04/29/2013  . Pure hypercholesterolemia 12/07/2012  . Iron deficiency anemia 12/07/2012  . Diabetic peripheral neuropathy associated with type 2 diabetes mellitus ([place]) 12/07/2012  . Chronic systolic congestive heart failure ([place]) 07/29/2012  . Left bundle-branch block 07/25/2012  . Coronary artery disease involving native coronary artery of native heart without angina pectoris 07/25/2012    Past Surgical History:  Procedure Laterality Date  . AMPUTATION TOE Right 06/18/2015   Procedure: AMPUTATION TOE;  Surgeon: Samara Deist, DPM;  Location: [facility] ORS;  Service: Podiatry;  Laterality: Right;  . AMPUTATION TOE Left 10/08/2018   Procedure: AMPUTATION TOE LEFT 2ND;  Surgeon: Albertine Patricia, DPM;  Location: [facility] ORS;  Service: Podiatry;  Laterality: Left;  . BACK SURGERY    . BI-VENTRICULAR IMPLANTABLE CARDIOVERTER DEFIBRILLATOR N/A 07/06/2014   Procedure: BI-VENTRICULAR IMPLANTABLE CARDIOVERTER DEFIBRILLATOR  (CRT-D);  Surgeon: Deboraha Sprang, MD;  Location: [facility] CATH LAB;  Service: Cardiovascular;  Laterality: N/A;  . BI-VENTRICULAR IMPLANTABLE CARDIOVERTER DEFIBRILLATOR  (CRT-D)  07/06/2014  . BILATERAL OOPHORECTOMY  01/2011   ovarian cyst benign  . BIV ICD INSERTION CRT-D N/A 03/31/2019   Procedure: BIV ICD INSERTION CRT-D;  Surgeon: Constance Haw, MD;  Location: [place] CV LAB;  Service: Cardiovascular;  Laterality: N/A;  . COLONOSCOPY WITH PROPOFOL Left 02/22/2015   Procedure: COLONOSCOPY WITH PROPOFOL;  Surgeon: Hulen Luster, MD;  Location: [facility] ENDOSCOPY;  Service: Endoscopy;  Laterality: Left;  . CORONARY ANGIOPLASTY WITH STENT PLACEMENT Left 07/2012   new onset systolic CHF; elevated troponins.  Cardiac catheterization with stenting to LAD; EF 15%.  2D-echo: EF 20-25%.  . ESOPHAGOGASTRODUODENOSCOPY N/A 02/22/2015   Procedure:  ESOPHAGOGASTRODUODENOSCOPY (EGD);  Surgeon: Hulen Luster, MD;  Location: [facility] ENDOSCOPY;  Service: Endoscopy;  Laterality: N/A;  . INCISION AND DRAINAGE ABSCESS Right 2007   groin; with ICU stay due to sepsis.  Marland Kitchen LAPAROSCOPIC CHOLECYSTECTOMY  2011  . LEFT HEART CATH AND CORONARY ANGIOGRAPHY N/A 10/05/2018   Procedure: LEFT HEART CATH AND CORONARY ANGIOGRAPHY;  Surgeon: Minna Merritts, MD;  Location: [place] CV LAB;  Service: Cardiovascular;  Laterality: N/A;  . LEFT HEART CATHETERIZATION WITH CORONARY ANGIOGRAM N/A 07/21/2012   Procedure: LEFT HEART CATHETERIZATION WITH CORONARY ANGIOGRAM;  Surgeon: Jolaine Artist, MD;  Location: [facility] CATH LAB;  Service: Cardiovascular;  Laterality: N/A;  . [place]   L4-5  . PACEMAKER REMOVAL  11/2018   due to infection around pacemaker  . PERCUTANEOUS CORONARY STENT INTERVENTION (PCI-S) N/A 07/23/2012   Procedure: PERCUTANEOUS CORONARY STENT INTERVENTION (PCI-S);  Surgeon: Sherren Mocha, MD;  Location: [facility] CATH LAB;  Service: Cardiovascular;  Laterality: N/A;  . PERIPHERAL VASCULAR BALLOON ANGIOPLASTY Left 10/06/2018   Procedure: PERIPHERAL VASCULAR BALLOON ANGIOPLASTY;  Surgeon: Katha Cabal, MD;  Location: [place] CV LAB;  Service: Cardiovascular;  Laterality: Left;  . PERIPHERAL VASCULAR CATHETERIZATION N/A 02/10/2015   Procedure: Picc Line Insertion;  Surgeon: Katha Cabal,  MD;  Location: [place] CV LAB;  Service: Cardiovascular;  Laterality: N/A;  . RIGHT HEART CATH N/A 03/29/2019   Procedure: RIGHT HEART CATH;  Surgeon: Jolaine Artist, MD;  Location: [place] CV LAB;  Service: Cardiovascular;  Laterality: N/A;  . TEE WITHOUT CARDIOVERSION N/A 10/02/2018   Procedure: TRANSESOPHAGEAL ECHOCARDIOGRAM (TEE);  Surgeon: Wellington Hampshire, MD;  Location: [facility] ORS;  Service: Cardiovascular;  Laterality: N/A;  . [place]  . VAGINAL HYSTERECTOMY  01/2011   Fibroids/DUB.  Ovaries removed. [place].     Prior to Admission medications   Medication Sig Start Date End Date Taking? Authorizing Provider  amiodarone (PACERONE) 200 MG tablet Take 1 tablet (200 mg total) by mouth daily. 04/25/20   Bensimhon, Shaune Pascal, MD  amoxicillin (AMOXIL) 500 MG capsule Take 4 capsules by mouth 30-60 minutes prior to dental work 05/03/20   Deboraha Sprang, MD  Calcium Carbonate-Vitamin D3 (CALCIUM 600-D) 600-400 MG-UNIT TABS Take 1 tablet by mouth 2 (two) times a day.    [provider]  carvedilol (COREG) 3.125 MG tablet Take 1 tablet (3.125 mg total) by mouth 2 (two) times daily with a meal. 09/14/19   Deboraha Sprang, MD  clopidogrel (PLAVIX) 75 MG tablet TAKE 1 TABLET BY MOUTH ONCE DAILY 05/04/20   Rutherford Guys, MD  ferrous sulfate 325 (65 FE) MG tablet Take 325 mg by mouth 2 (two) times daily with a meal.    [provider]  gabapentin (NEURONTIN) 600 MG tablet TAKE 1 TABLET BY IN THE MORNING AND 2 ATBEDTIME 03/29/20   Rutherford Guys, MD  hydrALAZINE (APRESOLINE) 25 MG tablet Take 0.5 tablets (12.5 mg total) by mouth 3 (three) times daily. Needs appt for further refills 05/16/20   Bensimhon, Shaune Pascal, MD  insulin aspart (NOVOLOG) 100 UNIT/ML FlexPen Inject 5 Units into the skin 3 (three) times daily with meals. 09/09/19   Rutherford Guys, MD  Insulin Glargine (LANTUS SOLOSTAR) 100 UNIT/ML Solostar Pen Inject 22 Units into the skin daily. 09/06/19   Rutherford Guys, MD  Insulin Pen Needle (PEN NEEDLES) 32G X 6 MM MISC 1 each by Does not apply route 4 (four) times daily. 09/06/19   Rutherford Guys, MD  isosorbide mononitrate (IMDUR) 30 MG 24 hr tablet Take 1 tablet (30 mg total) by mouth daily. Needs appt 05/16/20   Bensimhon, Shaune Pascal, MD  levothyroxine (SYNTHROID) 50 MCG tablet TAKE 1 TABLET BY MOUTH ONCE DAILY BEFOREBREAKFAST 05/16/20   Rutherford Guys, MD  losartan (COZAAR) 25 MG tablet Take 0.5 tablets (12.5 mg total) by mouth daily. Needs appt 05/16/20   Bensimhon, Shaune Pascal, MD  Multiple  Vitamin (MULTIVITAMIN) tablet Take 1 tablet by mouth daily.    [provider]  nitroGLYCERIN (NITROSTAT) 0.4 MG SL tablet Place 1 tablet (0.4 mg total) under the tongue every 5 (five) minutes as needed for chest pain. 03/28/20 06/26/20  Deboraha Sprang, MD  Omega-3 Fatty Acids (FISH OIL) 1000 MG CAPS Take 1 capsule by mouth 2 (two) times a day.     [provider]  ONETOUCH ULTRA test strip UUD TO CHECK BLOOD SUGAR 3 TIMES DAILY 03/02/20   Rutherford Guys, MD  rosuvastatin (CRESTOR) 10 MG tablet TAKE 1 TABLET BY MOUTH ONCE DAILY 04/07/20   Rutherford Guys, MD  torsemide (DEMADEX) 20 MG tablet TAKE 4 TABLETS (80 MG TOTAL) IN THE MORNING AND 2 TABS (40 MG) IN THE AFTERNOON. 04/27/20  Minna Merritts, MD  traMADol (ULTRAM) 50 MG tablet Take 1 tablet (50 mg total) by mouth every 6 (six) hours as needed. 06/18/20   Victorino Dike, FNP    Allergies Patient has no known allergies.  Family History  Problem Relation Age of Onset  . Diabetes Mother   . Hypertension Mother   . Arthritis Mother        knees, lumbar DDD, cervical DDD  . Cancer Father        prostate,skin,lymphoma.  . Cancer Brother 46       bladder cancer; non-smoker  . Diabetes Maternal Grandmother   . Heart disease Maternal Grandmother   . COPD Maternal Grandmother   . Diabetes Paternal Grandmother   . Obesity Brother   . Diabetes Son   . Hypertension Son   . Cancer Maternal Grandfather   . Diabetes Paternal Grandfather     Social History Social History   Tobacco Use  . Smoking status: Never Smoker  . Smokeless tobacco: Never Used  Vaping Use  . Vaping Use: Never used  Substance Use Topics  . Alcohol use: No    Alcohol/week: 0.0 standard drinks  . Drug use: No    Review of Systems Constitutional: Negative for fever. Cardiovascular: Negative for chest pain. Respiratory: Negative for shortness of breath. Musculoskeletal: Positive for right knee pain. Skin: Intact. Neurological: Negative for  decrease in sensation  ____________________________________________   PHYSICAL EXAM:  VITAL SIGNS: ED Triage Vitals  Enc Vitals Group     BP 06/18/20 1501 (!) 114/50     Pulse Rate 06/18/20 1501 (!) 59     Resp 06/18/20 1501 16     Temp 06/18/20 1501 98.3 F (36.8 C)     Temp Source 06/18/20 1501 Oral     SpO2 06/18/20 1501 99 %     Weight 06/18/20 1501 260 lb (117.9 kg)     Height 06/18/20 1501 5\' 8"  (1.727 m)     Head Circumference --      Peak Flow --      Pain Score 06/18/20 1508 6     Pain Loc --      Pain Edu? --      Excl. in [place]? --     Constitutional: Alert and oriented. Well appearing and in no acute distress. Eyes: Conjunctivae are clear without discharge or drainage Head: Atraumatic Neck: Supple. Respiratory: No cough. Respirations are even and unlabored. Musculoskeletal: Right knee diffusely tender.  No laxity on exam.  No ballottement. Neurologic: Motor and sensory function is intact. Skin: No open wounds or lesions. Psychiatric: Affect and behavior are appropriate.  ____________________________________________   LABS (all labs ordered are listed, but only abnormal results are displayed)  Labs Reviewed - No data to display ____________________________________________  RADIOLOGY  Imaging of the right knee shows no acute abnormalities but does have worsening osteoarthritis in comparison to previous images.  I, Sherrie George, personally viewed and evaluated these images (plain radiographs) as part of my medical decision making, as well as reviewing the written report by the radiologist.  DG Knee Complete 4 Views Right  Result Date: 06/18/2020 CLINICAL DATA:  RIGHT knee pain, felt a pop on Friday, difficulty bearing weight today EXAM: RIGHT KNEE - COMPLETE 4+ VIEW COMPARISON:  05/17/2010 FINDINGS: Osseous demineralization. Joint space narrowing and spur formation progressive since prior exam. No acute fracture, dislocation, or bone destruction. No joint  effusion. Atherosclerotic calcifications of superficial femoral artery through trifurcation. IMPRESSION: Osseous demineralization with  progressive osteoarthritic changes RIGHT knee. No acute abnormalities. Electronically Signed   By: Lavonia Dana M.D.   On: 06/18/2020 15:57   ____________________________________________   PROCEDURES  Procedures  ____________________________________________   INITIAL IMPRESSION / ASSESSMENT AND PLAN / ED COURSE  [name] is a 64 y.o. who presents to the emergency department for treatment and evaluation of nontraumatic right knee pain.  See HPI for further details.  Imaging is reassuring but it does show worsening arthritis in comparison to previous imaging.   Plan will be to place her in a hinged knee brace and encouraged her to continue using her walker.  She will be prescribed some tramadol and encouraged to follow-up with orthopedics.  She was also instructed to return to the emergency department for symptoms of change or worsen if she is unable to see orthopedics or primary care.   Medications  traMADol (ULTRAM) tablet 50 mg (50 mg Oral Given 06/18/20 1731)    Pertinent labs & imaging results that were available during my care of the patient were reviewed by me and considered in my medical decision making (see chart for details).   _________________________________________   FINAL CLINICAL IMPRESSION(S) / ED DIAGNOSES  Final diagnoses:  Osteoarthritis of right knee, unspecified osteoarthritis type    ED Discharge Orders         Ordered    traMADol (ULTRAM) 50 MG tablet  Every 6 hours PRN        06/18/20 1644           If controlled substance prescribed during this visit, 12 month history viewed on the [place] prior to issuing an initial prescription for Schedule II or III opiod.   Victorino Dike, FNP 06/18/20 2104    Lavonia Drafts, MD 06/18/20 2126

## 2020-06-18 NOTE — ED Notes (Signed)
Hinged knee brace placed on right knee. Pt wheeled to lobby.

## 2020-06-19 NOTE — Telephone Encounter (Signed)
06/19/2020 - PATIENT REQUESTING A REFILL ON HER TRAMADOL (ULTRAM) 50 MG. DR. IRMA REFILLED IT ON 06/16/2020. I HAVE SCHEDULED PATIENT FOR A TRANSFER OF CARE FROM DR. IRMA TO DR. Carlota Raspberry ON Wednesday (09/27/2020) AT 3:00 pm. I DO NOT HAVE TO ROUTE MESSAGE BACK TO THE CLINICAL TEAM AT THIS TIME. [place]

## 2020-06-22 DIAGNOSIS — M1711 Unilateral primary osteoarthritis, right knee: Secondary | ICD-10-CM | POA: Diagnosis not present

## 2020-06-28 ENCOUNTER — Telehealth: Payer: Self-pay | Admitting: *Deleted

## 2020-06-28 NOTE — Telephone Encounter (Signed)
Patient declined at this time

## 2020-06-29 ENCOUNTER — Ambulatory Visit (INDEPENDENT_AMBULATORY_CARE_PROVIDER_SITE_OTHER): Payer: PPO | Admitting: Emergency Medicine

## 2020-06-29 DIAGNOSIS — I428 Other cardiomyopathies: Secondary | ICD-10-CM

## 2020-07-04 LAB — CUP PACEART REMOTE DEVICE CHECK
Battery Remaining Longevity: 81 mo
Battery Voltage: 2.99 V
Brady Statistic AP VP Percent: 7.68 %
Brady Statistic AP VS Percent: 0.02 %
Brady Statistic AS VP Percent: 92.21 %
Brady Statistic AS VS Percent: 0.1 %
Brady Statistic RA Percent Paced: 7.6 %
Brady Statistic RV Percent Paced: 98.87 %
Date Time Interrogation Session: 20210927132305
HighPow Impedance: 52 Ohm
Implantable Lead Implant Date: 20200625
Implantable Lead Implant Date: 20200625
Implantable Lead Implant Date: 20200625
Implantable Lead Location: 753858
Implantable Lead Location: 753859
Implantable Lead Location: 753860
Implantable Lead Model: 4398
Implantable Lead Model: 5076
Implantable Lead Model: 6935
Implantable Pulse Generator Implant Date: 20200625
Lead Channel Impedance Value: 185.366
Lead Channel Impedance Value: 185.366
Lead Channel Impedance Value: 193.455
Lead Channel Impedance Value: 237.5 Ohm
Lead Channel Impedance Value: 250.943
Lead Channel Impedance Value: 304 Ohm
Lead Channel Impedance Value: 361 Ohm
Lead Channel Impedance Value: 399 Ohm
Lead Channel Impedance Value: 418 Ohm
Lead Channel Impedance Value: 475 Ohm
Lead Channel Impedance Value: 475 Ohm
Lead Channel Impedance Value: 532 Ohm
Lead Channel Impedance Value: 703 Ohm
Lead Channel Impedance Value: 703 Ohm
Lead Channel Impedance Value: 760 Ohm
Lead Channel Impedance Value: 760 Ohm
Lead Channel Impedance Value: 836 Ohm
Lead Channel Impedance Value: 893 Ohm
Lead Channel Pacing Threshold Amplitude: 0.625 V
Lead Channel Pacing Threshold Amplitude: 0.75 V
Lead Channel Pacing Threshold Amplitude: 1.875 V
Lead Channel Pacing Threshold Pulse Width: 0.4 ms
Lead Channel Pacing Threshold Pulse Width: 0.4 ms
Lead Channel Pacing Threshold Pulse Width: 0.6 ms
Lead Channel Sensing Intrinsic Amplitude: 0.5 mV
Lead Channel Sensing Intrinsic Amplitude: 0.5 mV
Lead Channel Sensing Intrinsic Amplitude: 15.125 mV
Lead Channel Sensing Intrinsic Amplitude: 15.125 mV
Lead Channel Setting Pacing Amplitude: 2 V
Lead Channel Setting Pacing Amplitude: 2 V
Lead Channel Setting Pacing Amplitude: 2.25 V
Lead Channel Setting Pacing Pulse Width: 0.4 ms
Lead Channel Setting Pacing Pulse Width: 0.6 ms
Lead Channel Setting Sensing Sensitivity: 0.3 mV

## 2020-07-04 NOTE — Progress Notes (Signed)
Remote ICD transmission.   

## 2020-07-06 DIAGNOSIS — M1711 Unilateral primary osteoarthritis, right knee: Secondary | ICD-10-CM | POA: Diagnosis not present

## 2020-07-07 ENCOUNTER — Other Ambulatory Visit: Payer: PPO | Admitting: Nurse Practitioner

## 2020-07-07 ENCOUNTER — Telehealth: Payer: Self-pay | Admitting: Nurse Practitioner

## 2020-07-07 ENCOUNTER — Other Ambulatory Visit: Payer: Self-pay

## 2020-07-07 NOTE — Telephone Encounter (Signed)
I attempted to call [name] for f/u PC visit, no answer message left. Visit not made as [name] was moving to a new location and address was to be confirmed prior to visit. Contact information left.

## 2020-07-12 ENCOUNTER — Other Ambulatory Visit: Payer: Self-pay | Admitting: Family Medicine

## 2020-07-26 DIAGNOSIS — G4733 Obstructive sleep apnea (adult) (pediatric): Secondary | ICD-10-CM | POA: Diagnosis not present

## 2020-08-02 ENCOUNTER — Telehealth: Payer: Self-pay | Admitting: Nurse Practitioner

## 2020-08-02 NOTE — Telephone Encounter (Signed)
I called [name] to reschedule f/u PC visit, no answer, message left with contact information

## 2020-08-10 ENCOUNTER — Telehealth: Payer: Self-pay | Admitting: Cardiovascular Disease

## 2020-08-10 NOTE — Telephone Encounter (Signed)
Plavix has been being filled by her PCP and Torsemide is still at a high dose, not sure if these directions are still accurate. Please advise if refills are appropriate.  Thank you.

## 2020-08-11 NOTE — Telephone Encounter (Signed)
I think the torsemide order is correct. Will send to Dr. Rockey Situ (Primary Cardiologist) and his nurse to review both RX's.

## 2020-08-13 NOTE — Telephone Encounter (Signed)
Has been seen by Dr. Caryl Comes x2 since my last visit Also followed by nephrology From what I can tell from their notes, list dose is correct

## 2020-08-14 NOTE — Telephone Encounter (Signed)
I have refilled the requested medications for the patient. She was seen by Dr. Rockey Situ in January 2021 and was to follow up with him in 2 months.  Will forward to scheduling to please reach out to the patient to follow up with her Primary Cardiologist.

## 2020-08-14 NOTE — Telephone Encounter (Signed)
Spoke with patient, stated she would call back when she got out of bed

## 2020-08-17 DIAGNOSIS — D631 Anemia in chronic kidney disease: Secondary | ICD-10-CM | POA: Diagnosis not present

## 2020-08-17 DIAGNOSIS — R768 Other specified abnormal immunological findings in serum: Secondary | ICD-10-CM | POA: Diagnosis not present

## 2020-08-17 DIAGNOSIS — N184 Chronic kidney disease, stage 4 (severe): Secondary | ICD-10-CM | POA: Diagnosis not present

## 2020-08-17 DIAGNOSIS — I129 Hypertensive chronic kidney disease with stage 1 through stage 4 chronic kidney disease, or unspecified chronic kidney disease: Secondary | ICD-10-CM | POA: Diagnosis not present

## 2020-08-17 DIAGNOSIS — E875 Hyperkalemia: Secondary | ICD-10-CM | POA: Diagnosis not present

## 2020-08-17 DIAGNOSIS — N2581 Secondary hyperparathyroidism of renal origin: Secondary | ICD-10-CM | POA: Diagnosis not present

## 2020-08-17 DIAGNOSIS — E1122 Type 2 diabetes mellitus with diabetic chronic kidney disease: Secondary | ICD-10-CM | POA: Diagnosis not present

## 2020-08-21 ENCOUNTER — Other Ambulatory Visit: Payer: Self-pay | Admitting: Family Medicine

## 2020-08-21 NOTE — Telephone Encounter (Signed)
Copied from [place] [id number]. Topic: Quick Communication - Rx Refill/Question >> Aug 21, 2020  2:00 PM Mcneil, Ja-Kwan wrote: Medication: traMADol (ULTRAM) 50 MG tablet  Has the patient contacted their pharmacy? yes   Preferred Pharmacy (with phone number or street name): [facility] - [place], [place] - [address] [place]  Phone: [phone] Fax: [phone]  Agent: Please be advised that RX refills may take up to 3 business days. We ask that you follow-up with your pharmacy.

## 2020-08-23 ENCOUNTER — Telehealth: Payer: Self-pay | Admitting: Family Medicine

## 2020-08-23 NOTE — Telephone Encounter (Signed)
[name] has TOC coming uo with Dr. Carlota Raspberry 09/26/2021 And is needing refill on What is the name of the medication? traMADol (ULTRAM) 50 MG tablet [[id number]]    Have you contacted your pharmacy to request a refill?Y  Which pharmacy would you like this sent to? [facility], [address]  [place], [place] [address]  Phone:  [phone] Fax:  [phone]    Patient notified that their request is being sent to the clinical staff for review and that they should receive a call once it is complete. If they do not receive a call within 72 hours they can check with their pharmacy or our office.

## 2020-08-24 DIAGNOSIS — R768 Other specified abnormal immunological findings in serum: Secondary | ICD-10-CM | POA: Diagnosis not present

## 2020-08-24 DIAGNOSIS — E1122 Type 2 diabetes mellitus with diabetic chronic kidney disease: Secondary | ICD-10-CM | POA: Diagnosis not present

## 2020-08-24 DIAGNOSIS — D631 Anemia in chronic kidney disease: Secondary | ICD-10-CM | POA: Diagnosis not present

## 2020-08-24 DIAGNOSIS — N184 Chronic kidney disease, stage 4 (severe): Secondary | ICD-10-CM | POA: Diagnosis not present

## 2020-08-24 DIAGNOSIS — E875 Hyperkalemia: Secondary | ICD-10-CM | POA: Diagnosis not present

## 2020-08-24 DIAGNOSIS — N2581 Secondary hyperparathyroidism of renal origin: Secondary | ICD-10-CM | POA: Diagnosis not present

## 2020-08-24 DIAGNOSIS — R809 Proteinuria, unspecified: Secondary | ICD-10-CM | POA: Diagnosis not present

## 2020-08-24 DIAGNOSIS — I129 Hypertensive chronic kidney disease with stage 1 through stage 4 chronic kidney disease, or unspecified chronic kidney disease: Secondary | ICD-10-CM | POA: Diagnosis not present

## 2020-08-24 NOTE — Telephone Encounter (Signed)
A message has been routed to [place] regarding this from the Rx request pool.

## 2020-08-25 NOTE — Telephone Encounter (Signed)
Patient is calling back regarding this request/ asking for courtesy refill/ she is in a lot of pain

## 2020-08-28 ENCOUNTER — Other Ambulatory Visit: Payer: Self-pay | Admitting: Family Medicine

## 2020-08-28 DIAGNOSIS — Z1231 Encounter for screening mammogram for malignant neoplasm of breast: Secondary | ICD-10-CM

## 2020-08-29 ENCOUNTER — Telehealth: Payer: Self-pay | Admitting: *Deleted

## 2020-08-29 NOTE — Telephone Encounter (Signed)
Called pt to sch med refill appt pt stated that she mad an TOC appt with Dr. Carlota Raspberry for December and per pt " if we can not fill her medications that I can shove her medications up my butt" pt also stated that if this can not be done then she will go to another office. Please advise.

## 2020-08-29 NOTE — Telephone Encounter (Signed)
Pt has decided to move her care to another facility. Pt is upset that no tramadol has been sent for the pain in her legs. Pt decided to cancel her toc appt with Dr. Carlota Raspberry. I explained to the pt no meds can be given until toc is rescheduled. Pt refused due to frustration of having to go to another pcp

## 2020-08-29 NOTE — Telephone Encounter (Signed)
Patient need to schedule appointment for refill and transition of care. Thank you

## 2020-09-04 ENCOUNTER — Telehealth: Payer: Self-pay | Admitting: *Deleted

## 2020-09-04 NOTE — Telephone Encounter (Signed)
On 08/29/2020, faxed denied Rx request for Tramadol to [facility]. Patient needs to schedule appt and see phone message from Judson Roch.

## 2020-09-13 DIAGNOSIS — L97521 Non-pressure chronic ulcer of other part of left foot limited to breakdown of skin: Secondary | ICD-10-CM | POA: Diagnosis not present

## 2020-09-13 DIAGNOSIS — E1142 Type 2 diabetes mellitus with diabetic polyneuropathy: Secondary | ICD-10-CM | POA: Diagnosis not present

## 2020-09-13 DIAGNOSIS — S90822A Blister (nonthermal), left foot, initial encounter: Secondary | ICD-10-CM | POA: Diagnosis not present

## 2020-09-13 DIAGNOSIS — Z89422 Acquired absence of other left toe(s): Secondary | ICD-10-CM | POA: Diagnosis not present

## 2020-09-13 DIAGNOSIS — B351 Tinea unguium: Secondary | ICD-10-CM | POA: Diagnosis not present

## 2020-09-14 ENCOUNTER — Telehealth: Payer: Self-pay

## 2020-09-14 NOTE — Telephone Encounter (Signed)
Palliative Care Volunteer call made to check in on patient on 09/13/20. No answer. 

## 2020-09-15 ENCOUNTER — Other Ambulatory Visit: Payer: Self-pay

## 2020-09-15 ENCOUNTER — Other Ambulatory Visit: Payer: Self-pay | Admitting: Family Medicine

## 2020-09-15 ENCOUNTER — Ambulatory Visit: Payer: PPO | Admitting: Neurology

## 2020-09-15 ENCOUNTER — Encounter: Payer: Self-pay | Admitting: Neurology

## 2020-09-15 VITALS — BP 122/54 | HR 59 | Ht 68.0 in | Wt 270.0 lb

## 2020-09-15 DIAGNOSIS — E1142 Type 2 diabetes mellitus with diabetic polyneuropathy: Secondary | ICD-10-CM | POA: Diagnosis not present

## 2020-09-15 DIAGNOSIS — G610 Guillain-Barre syndrome: Secondary | ICD-10-CM | POA: Diagnosis not present

## 2020-09-15 MED ORDER — DULOXETINE HCL 30 MG PO CPEP: 30.0000 mg | ORAL_CAPSULE | Freq: Every day | ORAL | 3 refills | Status: DC

## 2020-09-15 NOTE — Progress Notes (Signed)
Follow-up Visit   Date: 09/15/20   [name] MRN: [id number] DOB: [birth year]   Interim History: [name] is a 64 y.o. right-handed Caucasian female returning to the clinic for follow-up of diabetic polyradiculoneuropathy.  The patient was accompanied to the clinic by self.  She was last seen in 2019.  The past two years have been very difficult. She has been hospitalized several times for toe infection, sepsis, and pacemaker complication.  She required another toe amputation and is being followed closely by podiatry for another toe ulceration on the left foot. She very tragically lost her husband during one of her hospitalizations from heard disease in 2019. She is grieving his loss.  More recently, she had another family death last week and her niece was diagnosed with terminal spinal cord tumor last night.  She is very tearful at today's visit.  She scheduled the visit because of ongoing painful neuropathy. She takes gabapentin 600mg  in the morning and 1200mg  at bedtime, in addition to tramadol for breakthrough pain.  She was unable to see her prescribing doctor and request that I write it. Of note, her renal function has also deteriorated and she has GFR < 30. Left foot drop is unchanged, she does not use AFO due to discomfort.  Fortunately, she has not suffered any falls.  She is compliant with using her walker.   Medications:  Current Outpatient Medications on File Prior to Visit  Medication Sig Dispense Refill  . amiodarone (PACERONE) 200 MG tablet Take 1 tablet (200 mg total) by mouth daily. 90 tablet 1  . Calcium Carb-Cholecalciferol (CALCIUM CARBONATE-VITAMIN D3) 600-400 MG-UNIT TABS Take 1 tablet by mouth 2 (two) times a day.    . carvedilol (COREG) 3.125 MG tablet Take 1 tablet (3.125 mg total) by mouth 2 (two) times daily with a meal. 180 tablet 3  . clopidogrel (PLAVIX) 75 MG tablet TAKE 1 TABLET BY MOUTH ONCE DAILY 90 tablet 0  . ferrous sulfate 325 (65 FE) MG  tablet Take 325 mg by mouth 2 (two) times daily with a meal.    . gabapentin (NEURONTIN) 600 MG tablet TAKE 1 TABLET BY IN THE MORNING AND 2 ATBEDTIME 270 tablet 1  . insulin aspart (NOVOLOG) 100 UNIT/ML FlexPen Inject 5 Units into the skin 3 (three) times daily with meals. 15 mL 11  . Insulin Glargine (LANTUS SOLOSTAR) 100 UNIT/ML Solostar Pen Inject 22 Units into the skin daily. 5 pen 11  . Insulin Pen Needle (PEN NEEDLES) 32G X 6 MM MISC 1 each by Does not apply route 4 (four) times daily. 150 each 11  . isosorbide mononitrate (IMDUR) 30 MG 24 hr tablet Take 1 tablet (30 mg total) by mouth daily. Needs appt 90 tablet 3  . levothyroxine (SYNTHROID) 50 MCG tablet TAKE 1 TABLET BY MOUTH ONCE DAILY BEFOREBREAKFAST 90 tablet 0  . losartan (COZAAR) 25 MG tablet Take 0.5 tablets (12.5 mg total) by mouth daily. Needs appt 45 tablet 3  . Multiple Vitamin (MULTIVITAMIN) tablet Take 1 tablet by mouth daily.    . nitroGLYCERIN (NITROSTAT) 0.4 MG SL tablet Place 1 tablet (0.4 mg total) under the tongue every 5 (five) minutes as needed for chest pain. 25 tablet 3  . Omega-3 Fatty Acids (FISH OIL) 1000 MG CAPS Take 1 capsule by mouth 2 (two) times a day.     Letta Pate ULTRA test strip UUD TO CHECK BLOOD SUGAR 3 TIMES DAILY 300 each 11  . rosuvastatin (CRESTOR)  10 MG tablet TAKE 1 TABLET BY MOUTH ONCE DAILY 90 tablet 1  . torsemide (DEMADEX) 20 MG tablet TAKE 4 TABLETS (80 MG TOTAL) IN THE MORNING AND 2 TABS (40 MG) IN THE AFTERNOON. 540 tablet 0  . traMADol (ULTRAM) 50 MG tablet Take 1 tablet (50 mg total) by mouth every 6 (six) hours as needed. 20 tablet 0  . amoxicillin (AMOXIL) 500 MG capsule Take 4 capsules by mouth 30-60 minutes prior to dental work (Patient not taking: Reported on 09/15/2020) 4 capsule 3  . hydrALAZINE (APRESOLINE) 25 MG tablet Take 0.5 tablets (12.5 mg total) by mouth 3 (three) times daily. Needs appt for further refills (Patient not taking: Reported on 09/15/2020) 90 tablet 2   No  current facility-administered medications on file prior to visit.    Allergies: No Known Allergies  Vital Signs:  BP (!) 122/54   Pulse (!) 59   Ht 5\' 8"  (1.727 m)   Wt 270 lb (122.5 kg)   SpO2 99%   BMI 41.05 kg/m   Neurological Exam: MENTAL STATUS including orientation to time, place, person, recent and remote memory, attention span and concentration, language, and fund of knowledge is normal.  Speech is not dysarthric.  MOTOR:  Motor strength is 5/5 in the RUE and RLE,  LUE with 4/5 finger extensors (old), and 2/5 left dorsoflexion, proximal strength is 5/5.  No pronator drift.  Tone is normal.    MSRs:  Reflexes are 2+/4 in the arms, absent in the legs.  SENSORY:  Vibration absent at the ankles, intact at the knees and MCP.  COORDINATION/GAIT:  Left foot drop, steppage gait on the left, assisted with walker  Data: Lab Results  Component Value Date   CREATININE 2.44 (H) 11/05/2019   BUN 81 (H) 11/05/2019   NA 140 11/05/2019   K 4.9 11/05/2019   CL 97 (L) 11/05/2019   CO2 29 11/05/2019     IMPRESSION/PLAN: Diabetic polyradiculoneuropathy with left foot drop and distal painful paresthesias  - She has been on gabapentin 600mg  in AM and 1200mg  QHS for sometime.  Unable to titrate due renal function  - I will start low dose Cymbalta 30mg  - monitor for side effects  - Patient will follow-up with PCP for tramadol.  She is was informed that I do not write controlled pain medication.   Return to clinic in 4-6 months   Thank you for allowing me to participate in patient's care.  If I can answer any additional questions, I would be pleased to do so.    Sincerely,    Kazoua Gossen K. Allena Katz, DO

## 2020-09-15 NOTE — Patient Instructions (Addendum)
Start Cymbalta 30mg  at bedtime  Continue gabapentin as you are taking  Return to clinic 4-6 months

## 2020-09-18 DIAGNOSIS — D631 Anemia in chronic kidney disease: Secondary | ICD-10-CM | POA: Diagnosis not present

## 2020-09-18 DIAGNOSIS — E875 Hyperkalemia: Secondary | ICD-10-CM | POA: Diagnosis not present

## 2020-09-18 DIAGNOSIS — R809 Proteinuria, unspecified: Secondary | ICD-10-CM | POA: Diagnosis not present

## 2020-09-18 DIAGNOSIS — I129 Hypertensive chronic kidney disease with stage 1 through stage 4 chronic kidney disease, or unspecified chronic kidney disease: Secondary | ICD-10-CM | POA: Diagnosis not present

## 2020-09-18 DIAGNOSIS — E1122 Type 2 diabetes mellitus with diabetic chronic kidney disease: Secondary | ICD-10-CM | POA: Diagnosis not present

## 2020-09-18 DIAGNOSIS — N184 Chronic kidney disease, stage 4 (severe): Secondary | ICD-10-CM | POA: Diagnosis not present

## 2020-09-18 DIAGNOSIS — N2581 Secondary hyperparathyroidism of renal origin: Secondary | ICD-10-CM | POA: Diagnosis not present

## 2020-09-18 DIAGNOSIS — R768 Other specified abnormal immunological findings in serum: Secondary | ICD-10-CM | POA: Diagnosis not present

## 2020-09-18 NOTE — Telephone Encounter (Signed)
Please Advise. I see that this pt has not ye been seen by you

## 2020-09-19 NOTE — Telephone Encounter (Signed)
Has f/u scheduled.  Sent. Thanks.

## 2020-09-25 ENCOUNTER — Ambulatory Visit: Payer: PPO | Admitting: Family Medicine

## 2020-09-25 DIAGNOSIS — E1122 Type 2 diabetes mellitus with diabetic chronic kidney disease: Secondary | ICD-10-CM | POA: Diagnosis not present

## 2020-09-25 DIAGNOSIS — R768 Other specified abnormal immunological findings in serum: Secondary | ICD-10-CM | POA: Diagnosis not present

## 2020-09-25 DIAGNOSIS — E875 Hyperkalemia: Secondary | ICD-10-CM | POA: Diagnosis not present

## 2020-09-25 DIAGNOSIS — R809 Proteinuria, unspecified: Secondary | ICD-10-CM | POA: Diagnosis not present

## 2020-09-25 DIAGNOSIS — D631 Anemia in chronic kidney disease: Secondary | ICD-10-CM | POA: Diagnosis not present

## 2020-09-25 DIAGNOSIS — N2581 Secondary hyperparathyroidism of renal origin: Secondary | ICD-10-CM | POA: Diagnosis not present

## 2020-09-25 DIAGNOSIS — N184 Chronic kidney disease, stage 4 (severe): Secondary | ICD-10-CM | POA: Diagnosis not present

## 2020-09-25 DIAGNOSIS — I129 Hypertensive chronic kidney disease with stage 1 through stage 4 chronic kidney disease, or unspecified chronic kidney disease: Secondary | ICD-10-CM | POA: Diagnosis not present

## 2020-09-26 ENCOUNTER — Encounter: Payer: Self-pay | Admitting: Internal Medicine

## 2020-09-26 ENCOUNTER — Other Ambulatory Visit: Payer: Self-pay

## 2020-09-26 ENCOUNTER — Ambulatory Visit (INDEPENDENT_AMBULATORY_CARE_PROVIDER_SITE_OTHER): Payer: PPO | Admitting: Internal Medicine

## 2020-09-26 VITALS — BP 108/50 | HR 62 | Ht 68.0 in | Wt 268.5 lb

## 2020-09-26 DIAGNOSIS — Z95810 Presence of automatic (implantable) cardiac defibrillator: Secondary | ICD-10-CM

## 2020-09-26 DIAGNOSIS — E1142 Type 2 diabetes mellitus with diabetic polyneuropathy: Secondary | ICD-10-CM | POA: Diagnosis not present

## 2020-09-26 DIAGNOSIS — I428 Other cardiomyopathies: Secondary | ICD-10-CM | POA: Diagnosis not present

## 2020-09-26 DIAGNOSIS — E7800 Pure hypercholesterolemia, unspecified: Secondary | ICD-10-CM

## 2020-09-26 DIAGNOSIS — Z79899 Other long term (current) drug therapy: Secondary | ICD-10-CM | POA: Diagnosis not present

## 2020-09-26 DIAGNOSIS — I480 Paroxysmal atrial fibrillation: Secondary | ICD-10-CM | POA: Diagnosis not present

## 2020-09-26 DIAGNOSIS — I5022 Chronic systolic (congestive) heart failure: Secondary | ICD-10-CM

## 2020-09-26 DIAGNOSIS — I493 Ventricular premature depolarization: Secondary | ICD-10-CM | POA: Diagnosis not present

## 2020-09-26 DIAGNOSIS — I5043 Acute on chronic combined systolic (congestive) and diastolic (congestive) heart failure: Secondary | ICD-10-CM | POA: Diagnosis not present

## 2020-09-26 DIAGNOSIS — L97521 Non-pressure chronic ulcer of other part of left foot limited to breakdown of skin: Secondary | ICD-10-CM | POA: Diagnosis not present

## 2020-09-26 DIAGNOSIS — Z89422 Acquired absence of other left toe(s): Secondary | ICD-10-CM | POA: Diagnosis not present

## 2020-09-26 LAB — PACEMAKER DEVICE OBSERVATION

## 2020-09-26 NOTE — Patient Instructions (Addendum)
Medication Instructions:  - Your physician recommends that you continue on your current medications as directed. Please refer to the Current Medication list given to you today.  *If you need a refill on your cardiac medications before your next appointment, please call your pharmacy*   Lab Work: - Your physician recommends that you have lab work today: TSH/ Liver/ Lipid  If you have labs (blood work) drawn today and your tests are completely normal, you will receive your results only by: Marland Kitchen MyChart Message (if you have MyChart) OR . A paper copy in the mail If you have any lab test that is abnormal or we need to change your treatment, we will call you to review the results.   Testing/Procedures: - none ordered   Follow-Up: At [facility], you and your health needs are our priority.  As part of our continuing mission to provide you with exceptional heart care, we have created designated Provider Care Teams.  These Care Teams include your primary Cardiologist (physician) and Advanced Practice Providers (APPs -  Physician Assistants and Nurse Practitioners) who all work together to provide you with the care you need, when you need it.  We recommend signing up for the patient portal called "MyChart".  Sign up information is provided on this After Visit Summary.  MyChart is used to connect with patients for Virtual Visits (Telemedicine).  Patients are able to view lab/test results, encounter notes, upcoming appointments, etc.  Non-urgent messages can be sent to your provider as well.   To learn more about what you can do with MyChart, go to [email or web address].    Your next appointment:   6 month(s)  The format for your next appointment:   In Person  Provider:   Virl Axe, MD   Other Instructions n/a

## 2020-09-26 NOTE — Progress Notes (Signed)
Patient Care Team: [name], MD as PCP - General (Family Medicine) Minna Merritts, MD as PCP - Cardiology (Cardiology) Deboraha Sprang, MD as PCP - Electrophysiology (Cardiology) Bensimhon, Shaune Pascal, MD as PCP - Advanced Heart Failure (Cardiology) Calvert Cantor, MD as Consulting Physician (Ophthalmology) Lavonia Dana, MD as Consulting Physician (Internal Medicine) Elvina Mattes, Adele Schilder as Attending Physician (Podiatry) Tsosie Billing, MD as Consulting Physician (Infectious Diseases) Alda Berthold, DO as Consulting Physician (Neurology)   HPI  [name] is a 64 y.o. female Seen in follow-up for pocket hematoma related to a CRT-D re implantation 03/31/2019 [facility]) .  She had undergone extraction 3/20 2/2 recurrent MSSA bacteremia at [facility]  Patient had initial near normalization of LV systolic function following CRT with significant loss of LV function with extraction, although EM reminded me that her husband dies intercurrently as well (? [place]) Following reimplantation LV function has improved considerably (see below)  History of atrial tachycardia and PVCs  for which she has been treated with amiodarone.  Patient denies symptoms of GI intolerance, sun sensitivity, neurological symptoms attributable to amiodarone.    Chronic dyspnea.  Not mobile.  Biggest limitation is neuropathy.  No chest pain  DATE TEST EF   9/15 echo 30 %   1 /17 echo 50 %   12/19 Echo 45-50%   12/19 TEE  No vegetations  12/19 LHC  pLAD80 with ISR; mLAD70 pCx 80;D1 90 mRCA 40  6/20 Echo  10-15%   12/20 Echo 35%   6/21 Echo  50-55%          Date Cr K Hgb TSH LFTs   LDL  3./20 3.54>>1.18  11 4.64  18   7/20 2.15 4.9 9.7 (6/20)17.4    18   9/20  2.02 4.3  21.4  55  11/20 2.17 4.7 11.2 6.17 23   1/21 2.44 4.9      12/21 2.09 5.1 11.6         Records and Results Reviewed   Past Medical History:  Diagnosis Date  . Acute myocardial infarction,  subendocardial infarction, initial episode of care ([place]) 07/21/2012  . Acute osteomyelitis involving ankle and foot ([place]) 02/06/2015  . Acute systolic heart failure ([place]) 07/21/2012   New onset 07/19/12; admission to [facility] ED. Elevated Troponins.  S/p 2D-echo with EF 20-25%.  S/p cardiac catheterization with stenting LAD.  Repeat 2D-echo 10/2011 with improved EF of 35%.   . Anemia   . Automatic implantable cardioverter-defibrillator in situ    a. MDT CRT-D 06/2014, SN: [id number]  .removed in feb 2020  . CAD (coronary artery disease)    a. cardiac cath 101/04/2012: PCI/DES to chronically occluded mLAD, consideration PCI to diag branch in 4 weeks.   . Cataract   . Chicken pox   . Chronic kidney disease   . Chronic systolic CHF (congestive heart failure) (HCC)    a. mixed ICM & NICM; b. EF 20-25% by echo 07/2012, mid-dist 2/3 of LV sev HK/AK, mild MR. echo 10/2012: EF 30-35%, sev HK ant-septal & inf walls, GR1DD, mild MR, PASP 33. c. echo 02/2013: EF 30%, GR1DD, mild MR. echo 04/2014: EF 30%, Septal-lat dyssynchrony, global HK, inf AK, GR1DD, mild MR. d. echo 10/2014: EF50-55%, WM nl, GR1DD, septal mild paradox. e. echo 02/2015: EF 50-55%, wm   . Depression   . Heart attack ([place])   . Heel ulcer ([place]) 04/27/2015  . History of blood transfusion ~ 2011   "  plasma; had neuropathy; couldn't walk"  . Hypertension   . LBBB (left bundle branch block)   . Neuromuscular disorder ([place])   . Neuropathy 2011  . Obesity, unspecified   . OSA on CPAP    Moderate with AHI 23/hr and now on CPAP at 16cm H2O.  Her DME is [facility]  . Pure hypercholesterolemia   . Sepsis ([place]) 09/2018  . Type II diabetes mellitus ([place])   . Unspecified vitamin D deficiency     Past Surgical History:  Procedure Laterality Date  . AMPUTATION TOE Right 06/18/2015   Procedure: AMPUTATION TOE;  Surgeon: Samara Deist, DPM;  Location: [facility] ORS;  Service: Podiatry;  Laterality: Right;  . AMPUTATION TOE Left 10/08/2018   Procedure: AMPUTATION  TOE LEFT 2ND;  Surgeon: Albertine Patricia, DPM;  Location: [facility] ORS;  Service: Podiatry;  Laterality: Left;  . BACK SURGERY    . BI-VENTRICULAR IMPLANTABLE CARDIOVERTER DEFIBRILLATOR N/A 07/06/2014   Procedure: BI-VENTRICULAR IMPLANTABLE CARDIOVERTER DEFIBRILLATOR  (CRT-D);  Surgeon: Deboraha Sprang, MD;  Location: [facility] CATH LAB;  Service: Cardiovascular;  Laterality: N/A;  . BI-VENTRICULAR IMPLANTABLE CARDIOVERTER DEFIBRILLATOR  (CRT-D)  07/06/2014  . BILATERAL OOPHORECTOMY  01/2011   ovarian cyst benign  . BIV ICD INSERTION CRT-D N/A 03/31/2019   Procedure: BIV ICD INSERTION CRT-D;  Surgeon: Constance Haw, MD;  Location: [place] CV LAB;  Service: Cardiovascular;  Laterality: N/A;  . COLONOSCOPY WITH PROPOFOL Left 02/22/2015   Procedure: COLONOSCOPY WITH PROPOFOL;  Surgeon: Hulen Luster, MD;  Location: [facility] ENDOSCOPY;  Service: Endoscopy;  Laterality: Left;  . CORONARY ANGIOPLASTY WITH STENT PLACEMENT Left 07/2012   new onset systolic CHF; elevated troponins.  Cardiac catheterization with stenting to LAD; EF 15%.  2D-echo: EF 20-25%.  . ESOPHAGOGASTRODUODENOSCOPY N/A 02/22/2015   Procedure: ESOPHAGOGASTRODUODENOSCOPY (EGD);  Surgeon: Hulen Luster, MD;  Location: [facility] ENDOSCOPY;  Service: Endoscopy;  Laterality: N/A;  . INCISION AND DRAINAGE ABSCESS Right 2007   groin; with ICU stay due to sepsis.  Marland Kitchen LAPAROSCOPIC CHOLECYSTECTOMY  2011  . LEFT HEART CATH AND CORONARY ANGIOGRAPHY N/A 10/05/2018   Procedure: LEFT HEART CATH AND CORONARY ANGIOGRAPHY;  Surgeon: Minna Merritts, MD;  Location: [place] CV LAB;  Service: Cardiovascular;  Laterality: N/A;  . LEFT HEART CATHETERIZATION WITH CORONARY ANGIOGRAM N/A 07/21/2012   Procedure: LEFT HEART CATHETERIZATION WITH CORONARY ANGIOGRAM;  Surgeon: Jolaine Artist, MD;  Location: [facility] CATH LAB;  Service: Cardiovascular;  Laterality: N/A;  . [place]   L4-5  . PACEMAKER REMOVAL  11/2018   due to infection around pacemaker  .  PERCUTANEOUS CORONARY STENT INTERVENTION (PCI-S) N/A 07/23/2012   Procedure: PERCUTANEOUS CORONARY STENT INTERVENTION (PCI-S);  Surgeon: Sherren Mocha, MD;  Location: [facility] LAB;  Service: Cardiovascular;  Laterality: N/A;  . PERIPHERAL VASCULAR BALLOON ANGIOPLASTY Left 10/06/2018   Procedure: PERIPHERAL VASCULAR BALLOON ANGIOPLASTY;  Surgeon: Katha Cabal, MD;  Location: [place] CV LAB;  Service: Cardiovascular;  Laterality: Left;  . PERIPHERAL VASCULAR CATHETERIZATION N/A 02/10/2015   Procedure: Picc Line Insertion;  Surgeon: Katha Cabal, MD;  Location: [place] CV LAB;  Service: Cardiovascular;  Laterality: N/A;  . RIGHT HEART CATH N/A 03/29/2019   Procedure: RIGHT HEART CATH;  Surgeon: Jolaine Artist, MD;  Location: [place] CV LAB;  Service: Cardiovascular;  Laterality: N/A;  . TEE WITHOUT CARDIOVERSION N/A 10/02/2018   Procedure: TRANSESOPHAGEAL ECHOCARDIOGRAM (TEE);  Surgeon: Wellington Hampshire, MD;  Location: [facility] ORS;  Service: Cardiovascular;  Laterality: N/A;  . TUBAL LIGATION  1981  . VAGINAL HYSTERECTOMY  01/2011   Fibroids/DUB.  Ovaries removed. [place].    Current Meds  Medication Sig  . amiodarone (PACERONE) 200 MG tablet Take 1 tablet (200 mg total) by mouth daily.  . Calcium Carb-Cholecalciferol (CALCIUM CARBONATE-VITAMIN D3) 600-400 MG-UNIT TABS Take 1 tablet by mouth 2 (two) times a day.  . carvedilol (COREG) 3.125 MG tablet Take 1 tablet (3.125 mg total) by mouth 2 (two) times daily with a meal.  . clopidogrel (PLAVIX) 75 MG tablet TAKE 1 TABLET BY MOUTH ONCE DAILY  . DULoxetine (CYMBALTA) 30 MG capsule Take 1 capsule (30 mg total) by mouth at bedtime.  . ferrous sulfate 325 (65 FE) MG tablet Take 325 mg by mouth 2 (two) times daily with a meal.  . gabapentin (NEURONTIN) 600 MG tablet TAKE 1 TABLET BY IN THE MORNING AND 2 ATBEDTIME  . Insulin Glargine (LANTUS SOLOSTAR) 100 UNIT/ML Solostar Pen Inject 22 Units into the skin daily.  . Insulin  Pen Needle (PEN NEEDLES) 32G X 6 MM MISC 1 each by Does not apply route 4 (four) times daily.  . isosorbide mononitrate (IMDUR) 30 MG 24 hr tablet Take 1 tablet (30 mg total) by mouth daily. Needs appt  . levothyroxine (SYNTHROID) 50 MCG tablet TAKE 1 TABLET BY MOUTH ONCE DAILY BEFOREBREAKFAST  . losartan (COZAAR) 25 MG tablet Take 0.5 tablets (12.5 mg total) by mouth daily. Needs appt  . Multiple Vitamin (MULTIVITAMIN) tablet Take 1 tablet by mouth daily.  . nitroGLYCERIN (NITROSTAT) 0.4 MG SL tablet Place 1 tablet (0.4 mg total) under the tongue every 5 (five) minutes as needed for chest pain.  Marland Kitchen NOVOLOG FLEXPEN 100 UNIT/ML FlexPen INJECT 0.05 ML (5 UNITS TOTAL) INTO THE SKIN 3 TIMES DAILY BEFORE MEALS  . Omega-3 Fatty Acids (FISH OIL) 1000 MG CAPS Take 1 capsule by mouth 2 (two) times a day.   Glory Rosebush ULTRA test strip UUD TO CHECK BLOOD SUGAR 3 TIMES DAILY  . rosuvastatin (CRESTOR) 10 MG tablet TAKE 1 TABLET BY MOUTH ONCE DAILY  . torsemide (DEMADEX) 20 MG tablet TAKE 4 TABLETS (80 MG TOTAL) IN THE MORNING AND 2 TABS (40 MG) IN THE AFTERNOON.  . traMADol (ULTRAM) 50 MG tablet Take 1 tablet (50 mg total) by mouth every 6 (six) hours as needed.    No Known Allergies    Review of Systems negative except from HPI and PMH  Physical Exam BP (!) 108/50 (BP Location: Left Arm, Patient Position: Sitting, Cuff Size: Normal)   Pulse 62   Ht 5\' 8"  (1.727 m)   Wt 268 lb 8 oz (121.8 kg)   SpO2 99%   BMI 40.83 kg/m  Well developed and well nourished in no acute distress HENT normal Neck supple with JVP-flat Clear Device pocket well healed; without hematoma or erythema.  There is no tethering  Regular rate and rhythm, no  gallop No  murmur Abd-soft with active BS No Clubbing cyanosis  tr edema Skin-warm and dry A & Oriented  Grossly normal sensory and motor function  ECG sinus at 62 with P synchronous pacing QRS upright lead V1 negative lead I   Assessment and  Plan  CRT-D recent  reimplantation following extraction  Nonischemic cardiomyopathy  Coronary artery disease  Congestive heart failure- chronic-systolic class II  Renal insufficiency grade 3   Hypothyroidism  High Risk Medication Surveillance-Amiodarone   PVCs Euvolemic continue current meds  No intercurrent Ventricular tachycardia  Without symptoms of ischemia  We will recheck LDL.  Last checked more than a year ago.  Was at target at that time.  Renal insufficiency stable.  Continue losartan

## 2020-09-27 ENCOUNTER — Other Ambulatory Visit: Payer: Self-pay | Admitting: Internal Medicine

## 2020-09-27 ENCOUNTER — Encounter: Payer: PPO | Admitting: Family Medicine

## 2020-09-27 LAB — CUP PACEART INCLINIC DEVICE CHECK
Battery Remaining Longevity: 6.7
Date Time Interrogation Session: 20211221100121
HighPow Impedance: 57 Ohm
Implantable Lead Implant Date: 20200625
Implantable Lead Implant Date: 20200625
Implantable Lead Implant Date: 20200625
Implantable Lead Location: 753858
Implantable Lead Location: 753859
Implantable Lead Location: 753860
Implantable Lead Model: 4398
Implantable Lead Model: 5076
Implantable Lead Model: 6935
Implantable Pulse Generator Implant Date: 20200625
Lead Channel Impedance Value: 418 Ohm
Lead Channel Impedance Value: 456 Ohm
Lead Channel Impedance Value: 513 Ohm
Lead Channel Pacing Threshold Amplitude: 0.75 V
Lead Channel Pacing Threshold Amplitude: 0.75 V
Lead Channel Pacing Threshold Amplitude: 1.75 V
Lead Channel Pacing Threshold Amplitude: 2 V
Lead Channel Pacing Threshold Pulse Width: 0.4 ms
Lead Channel Pacing Threshold Pulse Width: 0.4 ms
Lead Channel Pacing Threshold Pulse Width: 0.6 ms
Lead Channel Pacing Threshold Pulse Width: 0.8 ms
Lead Channel Setting Pacing Amplitude: 2 V
Lead Channel Setting Pacing Amplitude: 2 V
Lead Channel Setting Pacing Amplitude: 2 V
Lead Channel Setting Pacing Pulse Width: 0.4 ms
Lead Channel Setting Pacing Pulse Width: 1 ms
Lead Channel Setting Sensing Sensitivity: 0.3 mV

## 2020-09-27 LAB — HEPATIC FUNCTION PANEL
ALT: 28 IU/L (ref 0–32)
AST: 24 IU/L (ref 0–40)
Albumin: 4.3 g/dL (ref 3.8–4.8)
Alkaline Phosphatase: 95 IU/L (ref 44–121)
Bilirubin Total: 0.4 mg/dL (ref 0.0–1.2)
Bilirubin, Direct: 0.14 mg/dL (ref 0.00–0.40)
Total Protein: 7.1 g/dL (ref 6.0–8.5)

## 2020-09-27 LAB — LIPID PANEL
Chol/HDL Ratio: 2.9 ratio (ref 0.0–4.4)
Cholesterol, Total: 128 mg/dL (ref 100–199)
HDL: 44 mg/dL (ref >39)
LDL Chol Calc (NIH): 60 mg/dL (ref 0–99)
Triglycerides: 134 mg/dL (ref 0–149)
VLDL Cholesterol Cal: 24 mg/dL (ref 5–40)

## 2020-09-27 LAB — TSH: TSH: 1.4 u[IU]/mL (ref 0.450–4.500)

## 2020-09-28 ENCOUNTER — Other Ambulatory Visit: Payer: Self-pay | Admitting: Family Medicine

## 2020-09-28 ENCOUNTER — Other Ambulatory Visit: Payer: Self-pay

## 2020-09-28 ENCOUNTER — Ambulatory Visit (INDEPENDENT_AMBULATORY_CARE_PROVIDER_SITE_OTHER): Payer: PPO | Admitting: Family Medicine

## 2020-09-28 ENCOUNTER — Encounter: Payer: Self-pay | Admitting: Family Medicine

## 2020-09-28 ENCOUNTER — Ambulatory Visit (INDEPENDENT_AMBULATORY_CARE_PROVIDER_SITE_OTHER): Payer: PPO

## 2020-09-28 VITALS — BP 138/64 | HR 61 | Temp 96.5°F | Ht 68.0 in | Wt 269.7 lb

## 2020-09-28 DIAGNOSIS — IMO0002 Reserved for concepts with insufficient information to code with codable children: Secondary | ICD-10-CM

## 2020-09-28 DIAGNOSIS — I428 Other cardiomyopathies: Secondary | ICD-10-CM

## 2020-09-28 DIAGNOSIS — E1165 Type 2 diabetes mellitus with hyperglycemia: Secondary | ICD-10-CM | POA: Diagnosis not present

## 2020-09-28 DIAGNOSIS — E1129 Type 2 diabetes mellitus with other diabetic kidney complication: Secondary | ICD-10-CM | POA: Diagnosis not present

## 2020-09-28 LAB — CUP PACEART REMOTE DEVICE CHECK
Battery Remaining Longevity: 76 mo
Battery Voltage: 2.98 V
Brady Statistic AP VP Percent: 0.89 %
Brady Statistic AP VS Percent: 0.02 %
Brady Statistic AS VP Percent: 98.96 %
Brady Statistic AS VS Percent: 0.13 %
Brady Statistic RA Percent Paced: 0.91 %
Brady Statistic RV Percent Paced: 99.5 %
Date Time Interrogation Session: 20211223033524
HighPow Impedance: 64 Ohm
Implantable Lead Implant Date: 20200625
Implantable Lead Implant Date: 20200625
Implantable Lead Implant Date: 20200625
Implantable Lead Location: 753858
Implantable Lead Location: 753859
Implantable Lead Location: 753860
Implantable Lead Model: 4398
Implantable Lead Model: 5076
Implantable Lead Model: 6935
Implantable Pulse Generator Implant Date: 20200625
Lead Channel Impedance Value: 208.174
Lead Channel Impedance Value: 208.174
Lead Channel Impedance Value: 211.021
Lead Channel Impedance Value: 270.667
Lead Channel Impedance Value: 270.667
Lead Channel Impedance Value: 342 Ohm
Lead Channel Impedance Value: 399 Ohm
Lead Channel Impedance Value: 399 Ohm
Lead Channel Impedance Value: 456 Ohm
Lead Channel Impedance Value: 532 Ohm
Lead Channel Impedance Value: 532 Ohm
Lead Channel Impedance Value: 551 Ohm
Lead Channel Impedance Value: 703 Ohm
Lead Channel Impedance Value: 779 Ohm
Lead Channel Impedance Value: 779 Ohm
Lead Channel Impedance Value: 817 Ohm
Lead Channel Impedance Value: 988 Ohm
Lead Channel Impedance Value: 988 Ohm
Lead Channel Pacing Threshold Amplitude: 0.625 V
Lead Channel Pacing Threshold Amplitude: 0.75 V
Lead Channel Pacing Threshold Amplitude: 1.625 V
Lead Channel Pacing Threshold Pulse Width: 0.4 ms
Lead Channel Pacing Threshold Pulse Width: 0.4 ms
Lead Channel Pacing Threshold Pulse Width: 0.8 ms
Lead Channel Sensing Intrinsic Amplitude: 0.75 mV
Lead Channel Sensing Intrinsic Amplitude: 0.75 mV
Lead Channel Sensing Intrinsic Amplitude: 18 mV
Lead Channel Sensing Intrinsic Amplitude: 18 mV
Lead Channel Setting Pacing Amplitude: 2 V
Lead Channel Setting Pacing Amplitude: 2 V
Lead Channel Setting Pacing Amplitude: 2.25 V
Lead Channel Setting Pacing Pulse Width: 0.4 ms
Lead Channel Setting Pacing Pulse Width: 0.8 ms
Lead Channel Setting Sensing Sensitivity: 0.3 mV

## 2020-09-28 LAB — POCT GLYCOSYLATED HEMOGLOBIN (HGB A1C): Hemoglobin A1C: 7.3 % — AB (ref 4.0–5.6)

## 2020-09-28 MED ORDER — NOVOLOG FLEXPEN 100 UNIT/ML ~~LOC~~ SOPN
5.0000 [IU] | PEN_INJECTOR | Freq: Two times a day (BID) | SUBCUTANEOUS | Status: DC
Start: 2020-09-28 — End: 2021-02-15

## 2020-09-28 MED ORDER — TRAMADOL HCL 50 MG PO TABS: 50.0000 mg | ORAL_TABLET | Freq: Every evening | ORAL | 0 refills | Status: DC | PRN

## 2020-09-28 NOTE — Telephone Encounter (Signed)
Medication Refill - Medication: Insulin Glargine (LANTUS SOLOSTAR) 100 UNIT/ML Solostar Pen   Has the patient contacted their pharmacy? yes (Agent: If no, request that the patient contact the pharmacy for the refill.) (Agent: If yes, when and what did the pharmacy advise?)Contact PCP  Preferred Pharmacy (with phone number or street name):  [facility] [place] Phone:  [phone]  Fax:  [phone]       Agent: Please be advised that RX refills may take up to 3 business days. We ask that you follow-up with your pharmacy.

## 2020-09-28 NOTE — Patient Instructions (Addendum)
If your AM sugar is reasonable, then don't change the lantus for now.    Check your sugar before and then 2 hours after supper and let me know.    Plan on recheck in about 3 months.  We can do labs at the visit.    Restart tramadol and let me know if that isn't helping.   Take care.  Glad to see you.

## 2020-09-28 NOTE — Progress Notes (Signed)
This visit occurred during the SARS-CoV-2 public health emergency.  Safety protocols were in place, including screening questions prior to the visit, additional usage of staff PPE, and extensive cleaning of exam room while observing appropriate contact time as indicated for disinfecting solutions.  New patient.    DM.  Usually eating 2 meals a day.  H/o Cr elevation.  Had used tramadol for leg pain/neuropathy.  Had been off for about 2 months and had more pain in the meantime.  No SZ hx.  She has #4 CKD.  She was prev taking 100mg  tramadol at night w/o ADE on med.  Discussed renal considerations with the medication.  Given A1c, if AM sugars are reasonable and no lows, then would continue as is.  We talked about her A1c at the office visit.  Recent sugar log reviewed.  Generally her blood sugars have been 100-150 in the a.m. but higher at night.    Cardiomyopathy per cards.  No chest pain.  I will review old records.  Advanced directive discussed with patient.  Daughter designated if patient were incapacitated.    Meds, vitals, and allergies reviewed.   ROS: Per HPI unless specifically indicated in ROS section   GEN: nad, alert and oriented HEENT: ncat NECK: supple w/o LA CV: rrr.  PULM: ctab, no inc wob ABD: soft, +bs EXT: no edema SKIN: Well-perfused.  31 minutes were devoted to patient care in this encounter (this includes time spent reviewing the patient's file/history, interviewing and examining the patient, counseling/reviewing plan with patient).

## 2020-10-02 ENCOUNTER — Other Ambulatory Visit: Payer: Self-pay

## 2020-10-02 ENCOUNTER — Encounter: Payer: Self-pay | Admitting: Emergency Medicine

## 2020-10-02 ENCOUNTER — Telehealth: Payer: Self-pay

## 2020-10-02 ENCOUNTER — Other Ambulatory Visit: Payer: Self-pay | Admitting: Family Medicine

## 2020-10-02 ENCOUNTER — Emergency Department
Admission: EM | Admit: 2020-10-02 | Discharge: 2020-10-02 | Disposition: A | Discharge status: Home / Self Care | Payer: PPO | Attending: Emergency Medicine | Admitting: Emergency Medicine

## 2020-10-02 ENCOUNTER — Emergency Department: Payer: PPO

## 2020-10-02 DIAGNOSIS — N189 Chronic kidney disease, unspecified: Secondary | ICD-10-CM | POA: Insufficient documentation

## 2020-10-02 DIAGNOSIS — Z794 Long term (current) use of insulin: Secondary | ICD-10-CM | POA: Diagnosis not present

## 2020-10-02 DIAGNOSIS — I2510 Atherosclerotic heart disease of native coronary artery without angina pectoris: Secondary | ICD-10-CM | POA: Insufficient documentation

## 2020-10-02 DIAGNOSIS — I5023 Acute on chronic systolic (congestive) heart failure: Secondary | ICD-10-CM | POA: Insufficient documentation

## 2020-10-02 DIAGNOSIS — E1122 Type 2 diabetes mellitus with diabetic chronic kidney disease: Secondary | ICD-10-CM | POA: Insufficient documentation

## 2020-10-02 DIAGNOSIS — E11621 Type 2 diabetes mellitus with foot ulcer: Secondary | ICD-10-CM | POA: Insufficient documentation

## 2020-10-02 DIAGNOSIS — E1149 Type 2 diabetes mellitus with other diabetic neurological complication: Secondary | ICD-10-CM | POA: Diagnosis not present

## 2020-10-02 DIAGNOSIS — Z950 Presence of cardiac pacemaker: Secondary | ICD-10-CM | POA: Insufficient documentation

## 2020-10-02 DIAGNOSIS — I517 Cardiomegaly: Secondary | ICD-10-CM | POA: Diagnosis not present

## 2020-10-02 DIAGNOSIS — N2581 Secondary hyperparathyroidism of renal origin: Secondary | ICD-10-CM | POA: Diagnosis not present

## 2020-10-02 DIAGNOSIS — Z89422 Acquired absence of other left toe(s): Secondary | ICD-10-CM | POA: Diagnosis not present

## 2020-10-02 DIAGNOSIS — E1169 Type 2 diabetes mellitus with other specified complication: Secondary | ICD-10-CM | POA: Diagnosis not present

## 2020-10-02 DIAGNOSIS — J069 Acute upper respiratory infection, unspecified: Secondary | ICD-10-CM | POA: Diagnosis not present

## 2020-10-02 DIAGNOSIS — L97409 Non-pressure chronic ulcer of unspecified heel and midfoot with unspecified severity: Secondary | ICD-10-CM | POA: Insufficient documentation

## 2020-10-02 DIAGNOSIS — E1142 Type 2 diabetes mellitus with diabetic polyneuropathy: Secondary | ICD-10-CM | POA: Insufficient documentation

## 2020-10-02 DIAGNOSIS — Z89421 Acquired absence of other right toe(s): Secondary | ICD-10-CM | POA: Insufficient documentation

## 2020-10-02 DIAGNOSIS — H1032 Unspecified acute conjunctivitis, left eye: Secondary | ICD-10-CM | POA: Insufficient documentation

## 2020-10-02 DIAGNOSIS — R079 Chest pain, unspecified: Secondary | ICD-10-CM | POA: Diagnosis not present

## 2020-10-02 DIAGNOSIS — Z20822 Contact with and (suspected) exposure to covid-19: Secondary | ICD-10-CM | POA: Diagnosis not present

## 2020-10-02 DIAGNOSIS — I130 Hypertensive heart and chronic kidney disease with heart failure and stage 1 through stage 4 chronic kidney disease, or unspecified chronic kidney disease: Secondary | ICD-10-CM | POA: Insufficient documentation

## 2020-10-02 DIAGNOSIS — R059 Cough, unspecified: Secondary | ICD-10-CM | POA: Diagnosis present

## 2020-10-02 DIAGNOSIS — H109 Unspecified conjunctivitis: Secondary | ICD-10-CM

## 2020-10-02 LAB — BASIC METABOLIC PANEL
Anion gap: 12 (ref 5–15)
BUN: 68 mg/dL — ABNORMAL HIGH (ref 8–23)
CO2: 28 mmol/L (ref 22–32)
Calcium: 8.7 mg/dL — ABNORMAL LOW (ref 8.9–10.3)
Chloride: 95 mmol/L — ABNORMAL LOW (ref 98–111)
Creatinine, Ser: 1.94 mg/dL — ABNORMAL HIGH (ref 0.44–1.00)
GFR, Estimated: 28 mL/min — ABNORMAL LOW (ref >60)
Glucose, Bld: 274 mg/dL — ABNORMAL HIGH (ref 70–99)
Potassium: 4.1 mmol/L (ref 3.5–5.1)
Sodium: 135 mmol/L (ref 135–145)

## 2020-10-02 LAB — PROTIME-INR
INR: 1 (ref 0.8–1.2)
Prothrombin Time: 12.5 seconds (ref 11.4–15.2)

## 2020-10-02 LAB — RESP PANEL BY RT-PCR (FLU A&B, COVID) ARPGX2
Influenza A by PCR: NEGATIVE
Influenza B by PCR: NEGATIVE
SARS Coronavirus 2 by RT PCR: NEGATIVE

## 2020-10-02 LAB — CBC
HCT: 35.1 % — ABNORMAL LOW (ref 36.0–46.0)
Hemoglobin: 11.4 g/dL — ABNORMAL LOW (ref 12.0–15.0)
MCH: 30.7 pg (ref 26.0–34.0)
MCHC: 32.5 g/dL (ref 30.0–36.0)
MCV: 94.6 fL (ref 80.0–100.0)
Platelets: 150 10*3/uL (ref 150–400)
RBC: 3.71 MIL/uL — ABNORMAL LOW (ref 3.87–5.11)
RDW: 13.7 % (ref 11.5–15.5)
WBC: 6.4 10*3/uL (ref 4.0–10.5)
nRBC: 0 % (ref 0.0–0.2)

## 2020-10-02 LAB — TROPONIN I (HIGH SENSITIVITY): Troponin I (High Sensitivity): 12 ng/L (ref <18)

## 2020-10-02 MED ORDER — ERYTHROMYCIN 5 MG/GM OP OINT: 1.0000 "application " | TOPICAL_OINTMENT | Freq: Three times a day (TID) | OPHTHALMIC | 0 refills | Status: DC

## 2020-10-02 NOTE — Telephone Encounter (Signed)
Please Advise

## 2020-10-02 NOTE — Telephone Encounter (Signed)
Per chart review tab pt is at [facility] ED.FYI to Dr Damita Dunnings and Dr Darnell Level.

## 2020-10-02 NOTE — Telephone Encounter (Signed)
[place] Day - Client TELEPHONE ADVICE RECORD [facility] Patient Name: [name] Gender: Female DOB: 1956-07-28 Age: 64 Y 34 M 19 D Return Phone Number: [phone] (Primary) Address: City/State/Zip: [place] [address] Client [facility] - Client Client Site [facility] [place] - Day Physician [facility] - PHYSICIAN, NOT LISTED- MD Contact Type Call Who Is Calling Patient / Member / Family / Caregiver Call Type Triage / Clinical Relationship To Patient Self Return Phone Number [phone] (Primary) Chief Complaint BREATHING - shortness of breath or sounds breathless Reason for Call Symptomatic / Request for [place] states she has a cough, sore throat, runny nose, congestion in chest and SOB. Caller states she has pink eye as well. Translation No Nurse Assessment Nurse: Alinda Money, RN, Sarah Date/Time Eilene Ghazi Time): 10/02/2020 12:03:18 PM Confirm and document reason for call. If symptomatic, describe symptoms. ---Caller states she has cough, sore throat, pink eye, congestion in chest, and shortness of breath. Nasal discharge is green. Symptoms started 3 days ago. No fever. Does the patient have any new or worsening symptoms? ---Yes Will a triage be completed? ---Yes Related visit to physician within the last 2 weeks? ---No Does the PT have any chronic conditions? (i.e. diabetes, asthma, this includes High risk factors for pregnancy, etc.) ---Yes List chronic conditions. ---DM2, neuropathy, PPM/AICD, kidney failure, CHF, Is this a behavioral health or substance abuse call? ---No Guidelines Guideline Title Affirmed Question Affirmed Notes Nurse Date/Time (Eastern Time) COVID-19 - Diagnosed or Suspected MODERATE difficulty breathing (e.g., speaks in phrases, SOB even at rest, pulse 100-120) Goins, RN, Judson Roch 10/02/2020 12:05:25 PM Disp. Time Eilene Ghazi Time) Disposition Final  User 10/02/2020 12:02:13 PM Send to Urgent Queue Schlegelmilch, Apolonio Schneiders 10/02/2020 12:07:19 PM Go to ED Now Yes Goins, RN, Daryl Eastern NOTE: All timestamps contained within this report are represented as [place] Standard Time. CONFIDENTIALTY NOTICE: This fax transmission is intended only for the addressee. It contains information that is legally privileged, confidential or otherwise protected from use or disclosure. If you are not the intended recipient, you are strictly prohibited from reviewing, disclosing, copying using or disseminating any of this information or taking any action in reliance on or regarding this information. If you have received this fax in error, please notify us immediately by telephone so that we can arrange for its return to [place]. Phone: [phone], Toll-Free: [phone], Fax: [phone] Page: 2 of 2 Call Id: [id number] [place] Disagree/Comply Comply Caller Understands Yes PreDisposition Did not know what to do Care Advice Given Per Guideline GO TO ED NOW: * You need to be seen in the Emergency Department. * Go to the ED at ___________ [place] given per COVID-19 - DIAGNOSED OR SUSPECTED (Adult) guideline. Referrals GO TO FACILITY UNDECIDED

## 2020-10-02 NOTE — Telephone Encounter (Signed)
Will await ER notes.  Thanks.

## 2020-10-02 NOTE — ED Notes (Signed)
Pt's eyelids are slightly swollen and red, conjunctival redness also noted in Left eye > right; pt reports feeling of "scratchy" and tears running out. Pt reports her eyes were "gooped" shut this morning and she used a washcloth to clean them. States her grandchildren were sick with URI and pink eye and she was around them on Christmas

## 2020-10-02 NOTE — ED Provider Notes (Signed)
[facility] Emergency Department Provider Note   ____________________________________________   Event Date/Time   First MD Initiated Contact with Patient 10/02/20 1826     (approximate)  I have reviewed the triage vital signs and the nursing notes.   HISTORY  Chief Complaint URI    HPI [name] is a 64 y.o. female with past medical history of hypertension, diabetes, CAD, CHF status post AICD, and CKD who presents to the ED complaining of cough and congestion.  Patient reports that she has been dealing with nonproductive cough and chest tightness for the past 2 to 3 days.  She denies any fever or chest pain, does describe mild difficulty breathing.  She has felt congested with some nasal drainage as well as redness in her left eye.  She states she has been around her grandchildren recently, who have been sick with similar symptoms.  She is fully vaccinated against COVID-19 and has received a booster.        Past Medical History:  Diagnosis Date  . Acute myocardial infarction, subendocardial infarction, initial episode of care ([place]) 07/21/2012  . Acute osteomyelitis involving ankle and foot ([place]) 02/06/2015  . Acute systolic heart failure ([place]) 07/21/2012   New onset 07/19/12; admission to [facility] ED. Elevated Troponins.  S/p 2D-echo with EF 20-25%.  S/p cardiac catheterization with stenting LAD.  Repeat 2D-echo 10/2011 with improved EF of 35%.   . Anemia   . Automatic implantable cardioverter-defibrillator in situ    a. MDT CRT-D 06/2014, SN: [id number]  .removed in feb 2020  . CAD (coronary artery disease)    a. cardiac cath 101/04/2012: PCI/DES to chronically occluded mLAD, consideration PCI to diag branch in 4 weeks.   . Cataract   . Chicken pox   . Chronic kidney disease   . Chronic systolic CHF (congestive heart failure) (HCC)    a. mixed ICM & NICM; b. EF 20-25% by echo 07/2012, mid-dist 2/3 of LV sev HK/AK, mild MR. echo 10/2012: EF  30-35%, sev HK ant-septal & inf walls, GR1DD, mild MR, PASP 33. c. echo 02/2013: EF 30%, GR1DD, mild MR. echo 04/2014: EF 30%, Septal-lat dyssynchrony, global HK, inf AK, GR1DD, mild MR. d. echo 10/2014: EF50-55%, WM nl, GR1DD, septal mild paradox. e. echo 02/2015: EF 50-55%, wm   . Depression   . Heart attack ([place])   . Heel ulcer ([place]) 04/27/2015  . History of blood transfusion ~ 2011   "plasma; had neuropathy; couldn't walk"  . Hypertension   . LBBB (left bundle branch block)   . Neuromuscular disorder ([place])   . Neuropathy 2011  . Obesity, unspecified   . OSA on CPAP    Moderate with AHI 23/hr and now on CPAP at 16cm H2O.  Her DME is [facility]  . Pure hypercholesterolemia   . Sepsis ([place]) 09/2018  . Type II diabetes mellitus ([place])   . Unspecified vitamin D deficiency     Patient Active Problem List   Diagnosis Date Noted  . Secondary hyperparathyroidism of renal origin ([place]) 07/20/2019  . Hyperkalemia 07/20/2019  . Benign hypertensive kidney disease with chronic kidney disease 07/20/2019  . Anemia in chronic kidney disease 07/20/2019  . Positive antinuclear antibody 07/20/2019  . Proteinuria 07/20/2019  . Acute on chronic systolic (congestive) heart failure ([place]) 03/23/2019  . UTI (urinary tract infection) 03/09/2019  . CAD (coronary artery disease) 03/09/2019  . DM type 2, uncontrolled, with renal complications ([place]) 28/78/6767  . Atrial tachycardia ([place])   .  Acute kidney injury superimposed on chronic kidney disease ([place])   . AKI (acute kidney injury) ([place])   . Weakness generalized 12/22/2018  . Shortness of breath 12/22/2018  . Palliative care encounter 12/22/2018  . Endocarditis 12/14/2018  . NSTEMI (non-ST elevated myocardial infarction) ([place]) 11/22/2018  . Acute on chronic combined systolic and diastolic CHF (congestive heart failure) ([place]) 10/28/2018  . Lymphedema 10/28/2018  . Severe sepsis ([place]) 09/28/2018  . History of amputation of lesser toe of right foot ([place]) 04/19/2018   . Presence of cardiac resynchronization therapy defibrillator (CRT-D) 04/19/2018  . Paraparesis of both lower limbs ([place]) 03/29/2018  . 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 Paroxysmal atrial fibrillation ([place]) 03/29/2018  . Class 3 obesity due to excess calories with serious comorbidity and body mass index (BMI) of 40.0 to 44.9 in adult 09/02/2016  . History of CVA (cerebrovascular accident) 11/11/2015  . Chronic renal insufficiency 08/04/2015  . OSA (obstructive sleep apnea) 08/15/2014  . Morbid obesity ([place]) 10/26/2013  . Essential hypertension, benign 04/29/2013  . Pure hypercholesterolemia 12/07/2012  . Iron deficiency anemia 12/07/2012  . Diabetic peripheral neuropathy associated with type 2 diabetes mellitus ([place]) 12/07/2012  . Chronic systolic congestive heart failure ([place]) 07/29/2012  . Left bundle-branch block 07/25/2012  . Coronary artery disease involving native coronary artery of native heart without angina pectoris 07/25/2012    Past Surgical History:  Procedure Laterality Date  . AMPUTATION TOE Right 06/18/2015   Procedure: AMPUTATION TOE;  Surgeon: Samara Deist, DPM;  Location: [facility] ORS;  Service: Podiatry;  Laterality: Right;  . AMPUTATION TOE Left 10/08/2018   Procedure: AMPUTATION TOE LEFT 2ND;  Surgeon: Albertine Patricia, DPM;  Location: [facility] ORS;  Service: Podiatry;  Laterality: Left;  . BACK SURGERY    . BI-VENTRICULAR IMPLANTABLE CARDIOVERTER DEFIBRILLATOR N/A 07/06/2014   Procedure: BI-VENTRICULAR IMPLANTABLE CARDIOVERTER DEFIBRILLATOR  (CRT-D);  Surgeon: Deboraha Sprang, MD;  Location: [facility] CATH LAB;  Service: Cardiovascular;  Laterality: N/A;  . BI-VENTRICULAR IMPLANTABLE CARDIOVERTER DEFIBRILLATOR  (CRT-D)  07/06/2014  . BILATERAL OOPHORECTOMY  01/2011   ovarian cyst benign  . BIV ICD INSERTION CRT-D N/A 03/31/2019   Procedure: BIV ICD INSERTION CRT-D;  Surgeon: Constance Haw, MD;  Location: [place] CV LAB;  Service: Cardiovascular;  Laterality: N/A;   . COLONOSCOPY WITH PROPOFOL Left 02/22/2015   Procedure: COLONOSCOPY WITH PROPOFOL;  Surgeon: Hulen Luster, MD;  Location: [facility] ENDOSCOPY;  Service: Endoscopy;  Laterality: Left;  . CORONARY ANGIOPLASTY WITH STENT PLACEMENT Left 07/2012   new onset systolic CHF; elevated troponins.  Cardiac catheterization with stenting to LAD; EF 15%.  2D-echo: EF 20-25%.  . ESOPHAGOGASTRODUODENOSCOPY N/A 02/22/2015   Procedure: ESOPHAGOGASTRODUODENOSCOPY (EGD);  Surgeon: Hulen Luster, MD;  Location: [facility] ENDOSCOPY;  Service: Endoscopy;  Laterality: N/A;  . INCISION AND DRAINAGE ABSCESS Right 2007   groin; with ICU stay due to sepsis.  Marland Kitchen LAPAROSCOPIC CHOLECYSTECTOMY  2011  . LEFT HEART CATH AND CORONARY ANGIOGRAPHY N/A 10/05/2018   Procedure: LEFT HEART CATH AND CORONARY ANGIOGRAPHY;  Surgeon: Minna Merritts, MD;  Location: [place] CV LAB;  Service: Cardiovascular;  Laterality: N/A;  . LEFT HEART CATHETERIZATION WITH CORONARY ANGIOGRAM N/A 07/21/2012   Procedure: LEFT HEART CATHETERIZATION WITH CORONARY ANGIOGRAM;  Surgeon: Jolaine Artist, MD;  Location: [facility] CATH LAB;  Service: Cardiovascular;  Laterality: N/A;  . [place]   L4-5  . PACEMAKER REMOVAL  11/2018   due to infection around pacemaker  . PERCUTANEOUS CORONARY STENT INTERVENTION (PCI-S)  N/A 07/23/2012   Procedure: PERCUTANEOUS CORONARY STENT INTERVENTION (PCI-S);  Surgeon: Sherren Mocha, MD;  Location: [facility] CATH LAB;  Service: Cardiovascular;  Laterality: N/A;  . PERIPHERAL VASCULAR BALLOON ANGIOPLASTY Left 10/06/2018   Procedure: PERIPHERAL VASCULAR BALLOON ANGIOPLASTY;  Surgeon: Katha Cabal, MD;  Location: [place] CV LAB;  Service: Cardiovascular;  Laterality: Left;  . PERIPHERAL VASCULAR CATHETERIZATION N/A 02/10/2015   Procedure: Picc Line Insertion;  Surgeon: Katha Cabal, MD;  Location: [place] CV LAB;  Service: Cardiovascular;  Laterality: N/A;  . RIGHT HEART CATH N/A 03/29/2019   Procedure: RIGHT  HEART CATH;  Surgeon: Jolaine Artist, MD;  Location: [place] CV LAB;  Service: Cardiovascular;  Laterality: N/A;  . TEE WITHOUT CARDIOVERSION N/A 10/02/2018   Procedure: TRANSESOPHAGEAL ECHOCARDIOGRAM (TEE);  Surgeon: Wellington Hampshire, MD;  Location: [facility] ORS;  Service: Cardiovascular;  Laterality: N/A;  . [place]  . VAGINAL HYSTERECTOMY  01/2011   Fibroids/DUB.  Ovaries removed. [place].    Prior to Admission medications   Medication Sig Start Date End Date Taking? Authorizing Provider  erythromycin ophthalmic ointment Place 1 application into the left eye 3 (three) times daily. 10/02/20  Yes Blake Divine, MD  amiodarone (PACERONE) 200 MG tablet Take 1 tablet (200 mg total) by mouth daily. 04/25/20   Bensimhon, Shaune Pascal, MD  Calcium Carb-Cholecalciferol (CALCIUM CARBONATE-VITAMIN D3) 600-400 MG-UNIT TABS Take 1 tablet by mouth 2 (two) times a day.    [provider]  carvedilol (COREG) 3.125 MG tablet TAKE 1 TABLET BY MOUTH TWICE (2) DAILY WITH A MEAL 09/27/20   Deboraha Sprang, MD  clopidogrel (PLAVIX) 75 MG tablet TAKE 1 TABLET BY MOUTH ONCE DAILY 08/14/20   Minna Merritts, MD  DULoxetine (CYMBALTA) 30 MG capsule Take 1 capsule (30 mg total) by mouth at bedtime. 09/15/20   Narda Amber K, DO  ferrous sulfate 325 (65 FE) MG tablet Take 325 mg by mouth 2 (two) times daily with a meal.    [provider]  gabapentin (NEURONTIN) 600 MG tablet TAKE 1 TABLET BY IN THE MORNING AND 2 ATBEDTIME 10/02/20   [name], MD  insulin aspart (NOVOLOG FLEXPEN) 100 UNIT/ML FlexPen Inject 5 Units into the skin 2 (two) times daily with a meal. 09/28/20   [name], MD  Insulin Pen Needle (PEN NEEDLES) 32G X 6 MM MISC 1 each by Does not apply route 4 (four) times daily. 09/06/19   Daleen Squibb, MD  isosorbide mononitrate (IMDUR) 30 MG 24 hr tablet Take 1 tablet (30 mg total) by mouth daily. Needs appt 05/16/20   Bensimhon, Shaune Pascal, MD  LANTUS  SOLOSTAR 100 UNIT/ML Solostar Pen INJECT 22 UNITS UNDER THE SKIN DAILY. 10/02/20   [name], MD  levothyroxine (SYNTHROID) 50 MCG tablet TAKE 1 TABLET BY MOUTH ONCE DAILY BEFOREBREAKFAST 07/12/20   Jacelyn Pi, Lilia Argue, MD  losartan (COZAAR) 25 MG tablet Take 0.5 tablets (12.5 mg total) by mouth daily. Needs appt 05/16/20   Bensimhon, Shaune Pascal, MD  Multiple Vitamin (MULTIVITAMIN) tablet Take 1 tablet by mouth daily.    [provider]  nitroGLYCERIN (NITROSTAT) 0.4 MG SL tablet Place 1 tablet (0.4 mg total) under the tongue every 5 (five) minutes as needed for chest pain. 03/28/20 06/26/20  Deboraha Sprang, MD  Omega-3 Fatty Acids (FISH OIL) 1000 MG CAPS Take 1 capsule by mouth 2 (two) times a day.     [provider]  ONETOUCH ULTRA test strip UUD TO CHECK BLOOD SUGAR 3 TIMES DAILY 03/02/20   Jacelyn Pi, Lilia Argue, MD  rosuvastatin (CRESTOR) 10 MG tablet TAKE 1 TABLET BY MOUTH ONCE DAILY 04/07/20   Jacelyn Pi, Lilia Argue, MD  torsemide (DEMADEX) 20 MG tablet TAKE 4 TABLETS (80 MG TOTAL) IN THE MORNING AND 2 TABS (40 MG) IN THE AFTERNOON. 08/14/20   Minna Merritts, MD  traMADol (ULTRAM) 50 MG tablet Take 1-2 tablets (50-100 mg total) by mouth at bedtime as needed. 09/28/20   [name], MD    Allergies Patient has no known allergies.  Family History  Problem Relation Age of Onset  . Diabetes Mother   . Hypertension Mother   . Arthritis Mother        knees, lumbar DDD, cervical DDD  . Cancer Father        prostate,skin,lymphoma.  . Cancer Brother 67       bladder cancer; non-smoker  . Diabetes Maternal Grandmother   . Heart disease Maternal Grandmother   . COPD Maternal Grandmother   . Diabetes Paternal Grandmother   . Obesity Brother   . Diabetes Son   . Hypertension Son   . Cancer Maternal Grandfather   . Diabetes Paternal Grandfather     Social History Social History   Tobacco Use  . Smoking status: Never Smoker  . Smokeless tobacco: Never Used   Vaping Use  . Vaping Use: Never used  Substance Use Topics  . Alcohol use: No    Alcohol/week: 0.0 standard drinks  . Drug use: No    Review of Systems  Constitutional: No fever/chills Eyes: No visual changes. ENT: No sore throat.  Positive for congestion. Cardiovascular: Denies chest pain. Respiratory: Positive for cough and shortness of breath. Gastrointestinal: No abdominal pain.  No nausea, no vomiting.  No diarrhea.  No constipation. Genitourinary: Negative for dysuria. Musculoskeletal: Negative for back pain. Skin: Negative for rash. Neurological: Negative for headaches, focal weakness or numbness.  ____________________________________________   PHYSICAL EXAM:  VITAL SIGNS: ED Triage Vitals  Enc Vitals Group     BP 10/02/20 1557 133/61     Pulse Rate 10/02/20 1557 65     Resp 10/02/20 1557 18     Temp 10/02/20 1557 98.8 F (37.1 C)     Temp Source 10/02/20 1557 Oral     SpO2 10/02/20 1557 99 %     Weight 10/02/20 1558 270 lb (122.5 kg)     Height 10/02/20 1558 5\' 8"  (1.727 m)     Head Circumference --      Peak Flow --      Pain Score 10/02/20 1600 0     Pain Loc --      Pain Edu? --      Excl. in [place]? --     Constitutional: Alert and oriented. Eyes: Left conjunctiva erythematous with no purulent drainage, right conjunctiva are normal. Head: Atraumatic. Nose: No congestion/rhinnorhea. Mouth/Throat: Mucous membranes are moist. Neck: Normal ROM Cardiovascular: Normal rate, regular rhythm. Grossly normal heart sounds.  2+ radial pulses bilaterally. Respiratory: Normal respiratory effort.  No retractions. Lungs CTAB. Gastrointestinal: Soft and nontender. No distention. Genitourinary: deferred Musculoskeletal: No lower extremity tenderness nor edema. Neurologic:  Normal speech and language. No gross focal neurologic deficits are appreciated. Skin:  Skin is warm, dry and intact. No rash noted. Psychiatric: Mood and affect are normal. Speech and behavior are  normal.  ____________________________________________   LABS (all labs ordered are listed,  but only abnormal results are displayed)  Labs Reviewed  BASIC METABOLIC PANEL - Abnormal; Notable for the following components:      Result Value   Chloride 95 (*)    Glucose, Bld 274 (*)    BUN 68 (*)    Creatinine, Ser 1.94 (*)    Calcium 8.7 (*)    GFR, Estimated 28 (*)    All other components within normal limits  CBC - Abnormal; Notable for the following components:   RBC 3.71 (*)    Hemoglobin 11.4 (*)    HCT 35.1 (*)    All other components within normal limits  RESP PANEL BY RT-PCR (FLU A&B, COVID) ARPGX2  PROTIME-INR  TROPONIN I (HIGH SENSITIVITY)   ____________________________________________  EKG  ED ECG REPORT I, Blake Divine, the attending physician, personally viewed and interpreted this ECG.   Date: 10/02/2020  EKG Time: 15:50  Rate: 66  Rhythm: Atrial sensed, ventricular paced  Axis: LAD  Intervals:none  ST&T Change: None   PROCEDURES  Procedure(s) performed (including Critical Care):  Procedures   ____________________________________________   INITIAL IMPRESSION / ASSESSMENT AND PLAN / ED COURSE       64 year old female with past medical history of hypertension, diabetes, CAD, CHF status post AICD, and CKD who presents to the ED complaining of cough, mild shortness of breath, and upper airway congestion for the past 2 to 3 days.  She is not in any respiratory distress and is maintaining O2 sats on room air.  Lungs are clear to auscultation bilaterally.  We will further assess with chest x-ray but I doubt ACS or other cardiac process as EKG and troponin are unremarkable.  Remainder of labs are reassuring.  We will also perform testing for COVID-19 as I suspect her symptoms are viral in origin.  Exam of left eye is consistent with a viral conjunctivitis.  Chest x-ray reviewed by me and shows no infiltrate, edema, or effusion.  COVID-19 testing is  pending and at this point I suspect patient symptoms are due to Covid or other viral illness.  We will prescribe erythromycin ointment for symptomatic management of her left eye and she is appropriate for discharge home with PCP follow-up.  She was counseled to return to the ED for new worsening symptoms, patient agrees with plan.      ____________________________________________   FINAL CLINICAL IMPRESSION(S) / ED DIAGNOSES  Final diagnoses:  Upper respiratory tract infection, unspecified type  Conjunctivitis of left eye, unspecified conjunctivitis type     ED Discharge Orders         Ordered    erythromycin ophthalmic ointment  3 times daily        10/02/20 1943           Note:  This document was prepared using Dragon voice recognition software and may include unintentional dictation errors.   Blake Divine, MD 10/02/20 1944

## 2020-10-02 NOTE — ED Triage Notes (Signed)
C/O sinus congestion (green ), non productive cough, left eye redness, right chest tightness x 2 day.  Denies fever.

## 2020-10-02 NOTE — Telephone Encounter (Signed)
Sent. Thanks.   

## 2020-10-04 ENCOUNTER — Encounter: Payer: Self-pay | Admitting: Family Medicine

## 2020-10-04 DIAGNOSIS — Z7189 Other specified counseling: Secondary | ICD-10-CM | POA: Insufficient documentation

## 2020-10-04 NOTE — Assessment & Plan Note (Signed)
If AM sugar is reasonable, then no change in lantus for now.  We may need to allow her to have permissive relative hyperglycemia at night to prevent nocturnal hypoglycemia.  Discussed.  She will check sugar before and then 2 hours after supper and let me know.    Plan on recheck in about 3 months.  We can do labs at the visit.    Restart tramadol and she will let me know if that isn't helping.  Routine cautions given to patient on medication.  Continue gabapentin losartan Crestor and other medications as listed in the EMR.

## 2020-10-11 NOTE — Progress Notes (Signed)
Remote ICD transmission.   

## 2020-10-12 ENCOUNTER — Ambulatory Visit: Payer: PPO

## 2020-10-12 ENCOUNTER — Ambulatory Visit: Payer: PPO | Admitting: Neurology

## 2020-10-15 NOTE — Progress Notes (Signed)
[facility] Progress Note     10/16/2020 [name]   [birth year]  [id number]  Primary Physician [name], MD Primary Cardiologist: Ida Rogue, MD  Electrophysiologist: Virl Axe, MD   Advanced HF: Dr. Haroldine Laws  Sleep Clinic: Dr. Radford Pax   Reason for Visit/CC: f/u for chronic systolic HF  HPI:  [name] is a 65 y.o. 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  Admitted on 6/20 with A/C biventricular HF.EF improved previously with CRT but back down again 10-15% (6/20) after CRT removal at [facility] due to sepsis with TV endocarditis. Placed on milrinone which was later weaned off. Unfortunately while hospitalized her husband had CVA and passed away on 28-Apr-2023. On 04/28/2019 she underwent CRT-D reimplantation and was discharged home.   She has made a steady recovery with CRT. Echo 6/21 EF 50-55%   Saw Dr. Caryl Comes 12/21   She was seen in the ED 10/02/20. Felt to have URI. No COVID.   Today she returns for HF follow up.Overall feeling fine. SOB with steps.  Denies PND/Orthopnea. Appetite ok. No fever or chills. Weight at home 259-261 pounds. Taking all medications.   Optivol- Fluid index low. Activity < 1 hour per day. No VT/A fib.     Cardiac Studies  [facility] 03/2019  Hemo Data   Most Recent Value  Fick Cardiac Output 5.37 L/min  Fick Cardiac Output Index 2.24 (L/min)/BSA  RA A Wave 16 mmHg  RA V Wave 18 mmHg  RA Mean 16 mmHg  RV Systolic Pressure 57 mmHg  RV Diastolic Pressure 8 mmHg  RV EDP 20 mmHg  PA Systolic Pressure 57 mmHg  PA Diastolic Pressure 28 mmHg  PA Mean 40 mmHg  PW A Wave 27 mmHg  PW V Wave 36 mmHg  PW Mean 27 mmHg  QP/QS 1  TPVR Index 17.85 HRUI    2D Echo 03/2019 1. The left ventricle has severely reduced systolic function, with an ejection fraction  of 10-15%. The cavity was mildly dilated. There is mildly increased left ventricular wall thickness. Left ventricular diastolic Doppler parameters are indeterminate.  2. The right ventricle has severely reduced systolic function. The cavity was moderately enlarged. There is no increase in right ventricular wall thickness. Right ventricular systolic pressure is moderately elevated with an estimated pressure of 45.2  mmHg.  3. Left atrial size was mildly dilated.  4. Right atrial size was moderately dilated.  5. The mitral valve is degenerative. Mild thickening of the mitral valve leaflet. There is mild mitral annular calcification present. Mitral valve regurgitation is moderate by color flow Doppler.  6. Tricuspid valve regurgitation is moderate-severe.  7. The aortic valve is tricuspid. Mild thickening of the aortic valve. Aortic valve regurgitation is trivial by color flow Doppler.  8. The aortic root and ascending aorta are normal in size and structure.  9. The inferior vena cava was dilated in size with <50% respiratory variability.    Current Meds  Medication Sig  . amiodarone (PACERONE) 200 MG tablet Take 1 tablet (200 mg total) by mouth daily.  . Calcium Carb-Cholecalciferol (CALCIUM CARBONATE-VITAMIN D3) 600-400 MG-UNIT TABS Take 1 tablet by mouth 2 (two) times a day.  . carvedilol (COREG) 3.125 MG tablet Take 3.125 mg by mouth daily.  . clopidogrel (PLAVIX) 75 MG tablet TAKE 1  TABLET BY MOUTH ONCE DAILY  . DULoxetine (CYMBALTA) 30 MG capsule Take 1 capsule (30 mg total) by mouth at bedtime.  . ferrous sulfate 325 (65 FE) MG tablet Take 325 mg by mouth 2 (two) times daily with a meal.  . gabapentin (NEURONTIN) 600 MG tablet TAKE 1 TABLET BY IN THE MORNING AND 2 ATBEDTIME  . insulin aspart (NOVOLOG FLEXPEN) 100 UNIT/ML FlexPen Inject 5 Units into the skin 2 (two) times daily with a meal.  . Insulin Pen Needle (PEN NEEDLES) 32G X 6 MM MISC 1 each by Does not apply route 4 (four) times  daily.  . isosorbide mononitrate (IMDUR) 30 MG 24 hr tablet Take 1 tablet (30 mg total) by mouth daily. Needs appt  . LANTUS SOLOSTAR 100 UNIT/ML Solostar Pen INJECT 22 UNITS UNDER THE SKIN DAILY.  Marland Kitchen levothyroxine (SYNTHROID) 50 MCG tablet TAKE 1 TABLET BY MOUTH ONCE DAILY BEFOREBREAKFAST  . losartan (COZAAR) 25 MG tablet Take 0.5 tablets (12.5 mg total) by mouth daily. Needs appt  . Multiple Vitamin (MULTIVITAMIN) tablet Take 1 tablet by mouth daily.  . nitroGLYCERIN (NITROSTAT) 0.4 MG SL tablet Place 1 tablet (0.4 mg total) under the tongue every 5 (five) minutes as needed for chest pain.  . Omega-3 Fatty Acids (FISH OIL) 1000 MG CAPS Take 1 capsule by mouth 2 (two) times a day.   Glory Rosebush ULTRA test strip UUD TO CHECK BLOOD SUGAR 3 TIMES DAILY  . rosuvastatin (CRESTOR) 10 MG tablet TAKE 1 TABLET BY MOUTH ONCE DAILY  . torsemide (DEMADEX) 20 MG tablet TAKE 4 TABLETS (80 MG TOTAL) IN THE MORNING AND 2 TABS (40 MG) IN THE AFTERNOON.  . traMADol (ULTRAM) 50 MG tablet TAKE 1 TO 2 TABLETS BY MOUTH AT BEDTIME AS NEEDED   No Known Allergies Past Medical History:  Diagnosis Date  . Acute myocardial infarction, subendocardial infarction, initial episode of care ([place]) 07/21/2012  . Acute osteomyelitis involving ankle and foot ([place]) 02/06/2015  . Acute systolic heart failure ([place]) 07/21/2012   New onset 07/19/12; admission to [facility] ED. Elevated Troponins.  S/p 2D-echo with EF 20-25%.  S/p cardiac catheterization with stenting LAD.  Repeat 2D-echo 10/2011 with improved EF of 35%.   . Anemia   . Automatic implantable cardioverter-defibrillator in situ    a. MDT CRT-D 06/2014, SN: [id number]  .removed in feb 2020  . CAD (coronary artery disease)    a. cardiac cath 101/04/2012: PCI/DES to chronically occluded mLAD, consideration PCI to diag branch in 4 weeks.   . Cataract   . Chicken pox   . Chronic kidney disease   . Chronic systolic CHF (congestive heart failure) (HCC)    a. mixed ICM & NICM; b.  EF 20-25% by echo 07/2012, mid-dist 2/3 of LV sev HK/AK, mild MR. echo 10/2012: EF 30-35%, sev HK ant-septal & inf walls, GR1DD, mild MR, PASP 33. c. echo 02/2013: EF 30%, GR1DD, mild MR. echo 04/2014: EF 30%, Septal-lat dyssynchrony, global HK, inf AK, GR1DD, mild MR. d. echo 10/2014: EF50-55%, WM nl, GR1DD, septal mild paradox. e. echo 02/2015: EF 50-55%, wm   . Depression   . Heart attack ([place])   . Heel ulcer ([place]) 04/27/2015  . History of blood transfusion ~ 2011   "plasma; had neuropathy; couldn't walk"  . Hypertension   . LBBB (left bundle branch block)   . Neuromuscular disorder ([place])   . Neuropathy 2011  . Obesity, unspecified   . OSA on CPAP    Moderate  with AHI 23/hr and now on CPAP at 16cm H2O.  Her DME is [facility]  . Pure hypercholesterolemia   . Sepsis ([place]) 09/2018  . Type II diabetes mellitus ([place])   . Unspecified vitamin D deficiency    Family History  Problem Relation Age of Onset  . Diabetes Mother   . Hypertension Mother   . Arthritis Mother        knees, lumbar DDD, cervical DDD  . Cancer Father        prostate,skin,lymphoma.  . Cancer Brother 53       bladder cancer; non-smoker  . Diabetes Maternal Grandmother   . Heart disease Maternal Grandmother   . COPD Maternal Grandmother   . Diabetes Paternal Grandmother   . Obesity Brother   . Diabetes Son   . Hypertension Son   . Cancer Maternal Grandfather   . Diabetes Paternal Grandfather    Past Surgical History:  Procedure Laterality Date  . AMPUTATION TOE Right 06/18/2015   Procedure: AMPUTATION TOE;  Surgeon: Samara Deist, DPM;  Location: [facility] ORS;  Service: Podiatry;  Laterality: Right;  . AMPUTATION TOE Left 10/08/2018   Procedure: AMPUTATION TOE LEFT 2ND;  Surgeon: Albertine Patricia, DPM;  Location: [facility] ORS;  Service: Podiatry;  Laterality: Left;  . BACK SURGERY    . BI-VENTRICULAR IMPLANTABLE CARDIOVERTER DEFIBRILLATOR N/A 07/06/2014   Procedure: BI-VENTRICULAR IMPLANTABLE CARDIOVERTER DEFIBRILLATOR  (CRT-D);   Surgeon: Deboraha Sprang, MD;  Location: [facility] CATH LAB;  Service: Cardiovascular;  Laterality: N/A;  . BI-VENTRICULAR IMPLANTABLE CARDIOVERTER DEFIBRILLATOR  (CRT-D)  07/06/2014  . BILATERAL OOPHORECTOMY  01/2011   ovarian cyst benign  . BIV ICD INSERTION CRT-D N/A 03/31/2019   Procedure: BIV ICD INSERTION CRT-D;  Surgeon: Constance Haw, MD;  Location: [place] CV LAB;  Service: Cardiovascular;  Laterality: N/A;  . COLONOSCOPY WITH PROPOFOL Left 02/22/2015   Procedure: COLONOSCOPY WITH PROPOFOL;  Surgeon: Hulen Luster, MD;  Location[facility] ENDOSCOPY;  Service: Endoscopy;  Laterality: Left;  . CORONARY ANGIOPLASTY WITH STENT PLACEMENT Left 07/2012   new onset systolic CHF; elevated troponins.  Cardiac catheterization with stenting to LAD; EF 15%.  2D-echo: EF 20-25%.  . ESOPHAGOGASTRODUODENOSCOPY N/A 02/22/2015   Procedure: ESOPHAGOGASTRODUODENOSCOPY (EGD);  Surgeon: Hulen Luster, MD;  Location: [facility] ENDOSCOPY;  Service: Endoscopy;  Laterality: N/A;  . INCISION AND DRAINAGE ABSCESS Right 2007   groin; with ICU stay due to sepsis.  Marland Kitchen LAPAROSCOPIC CHOLECYSTECTOMY  2011  . LEFT HEART CATH AND CORONARY ANGIOGRAPHY N/A 10/05/2018   Procedure: LEFT HEART CATH AND CORONARY ANGIOGRAPHY;  Surgeon: Minna Merritts, MD;  Location: [place] CV LAB;  Service: Cardiovascular;  Laterality: N/A;  . LEFT HEART CATHETERIZATION WITH CORONARY ANGIOGRAM N/A 07/21/2012   Procedure: LEFT HEART CATHETERIZATION WITH CORONARY ANGIOGRAM;  Surgeon: Jolaine Artist, MD;  Location: [facility] CATH LAB;  Service: Cardiovascular;  Laterality: N/A;  . [place]   L4-5  . PACEMAKER REMOVAL  11/2018   due to infection around pacemaker  . PERCUTANEOUS CORONARY STENT INTERVENTION (PCI-S) N/A 07/23/2012   Procedure: PERCUTANEOUS CORONARY STENT INTERVENTION (PCI-S);  Surgeon: Sherren Mocha, MD;  Location: [facility] CATH LAB;  Service: Cardiovascular;  Laterality: N/A;  . PERIPHERAL VASCULAR BALLOON ANGIOPLASTY Left  10/06/2018   Procedure: PERIPHERAL VASCULAR BALLOON ANGIOPLASTY;  Surgeon: Katha Cabal, MD;  Location: [place] CV LAB;  Service: Cardiovascular;  Laterality: Left;  . PERIPHERAL VASCULAR CATHETERIZATION N/A 02/10/2015   Procedure: Picc Line Insertion;  Surgeon: [name]  Schnier, MD;  Location: [place] CV LAB;  Service: Cardiovascular;  Laterality: N/A;  . RIGHT HEART CATH N/A 03/29/2019   Procedure: RIGHT HEART CATH;  Surgeon: Jolaine Artist, MD;  Location: [place] CV LAB;  Service: Cardiovascular;  Laterality: N/A;  . TEE WITHOUT CARDIOVERSION N/A 10/02/2018   Procedure: TRANSESOPHAGEAL ECHOCARDIOGRAM (TEE);  Surgeon: Wellington Hampshire, MD;  Location: [facility] ORS;  Service: Cardiovascular;  Laterality: N/A;  . [name]  . VAGINAL HYSTERECTOMY  01/2011   Fibroids/DUB.  Ovaries removed. [place].   Social History   Socioeconomic History  . Marital status: Married    Spouse name: [name]  . Number of children: 2  . Years of education: 22  . Highest education level: High school graduate  Occupational History  . Occupation: disabled    Fish farm manager: UNEMPLOYED    Comment: 03/2010 for peripheral neuropathy  . 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  Tobacco Use  . Smoking status: Never Smoker  . Smokeless tobacco: Never Used  Vaping Use  . Vaping Use: Never used  Substance and Sexual Activity  . Alcohol use: No    Alcohol/week: 0.0 standard drinks  . Drug use: No  . Sexual activity: Never    Birth control/protection: Post-menopausal, Surgical  Other Topics Concern  . Not on file  Social History Narrative   Widowed, husband died when she was in surgery.        Children:  2 children (daughter, son). Two grandsons and 2 step grandchildren.      Lives: with husband, daughter, son-in-law, 2 grandsons, and 1 on the way.      Employment: disability for peripheral neuropathy 2012.  Previously had home daycare.   Social Determinants of Health    Financial Resource Strain: Not on file  Food Insecurity: Not on file  Transportation Needs: Not on file  Physical Activity: Not on file  Stress: Not on file  Social Connections: Not on file  Intimate Partner Violence: Not on file     Lipid Panel     Component Value Date/Time   CHOL 128 09/26/2020 1216   TRIG 134 09/26/2020 1216   HDL 44 09/26/2020 1216   CHOLHDL 2.9 09/26/2020 1216   CHOLHDL 5.6 (H) 08/06/2016 1431   VLDL 34 (H) 08/06/2016 1431   LDLCALC 60 09/26/2020 1216    Physical Exam:  Blood pressure (!) 148/80, pulse 60, weight 120.7 kg (266 lb 3.2 oz), SpO2 99 %.  Wt Readings from Last 3 Encounters:  10/16/20 120.7 kg (266 lb 3.2 oz)  10/02/20 122.5 kg (270 lb)  09/28/20 122.3 kg (269 lb 11.2 oz)    General:  Obese woman  Well appearing. No resp difficulty HEENT: normal Neck: supple. no JVD. Carotids 2+ bilat; no bruits. No lymphadenopathy or thryomegaly appreciated. Cor: PMI nondisplaced. Regular rate & rhythm. No rubs, gallops or murmurs. Lungs: clear Abdomen: obese  soft, nontender, nondistended. No hepatosplenomegaly. No bruits or masses. Good bowel sounds. Extremities: no cyanosis, clubbing, rash, edema Neuro: alert & orientedx3, cranial nerves grossly intact. moves all 4 extremities w/o difficulty. Affect pleasant   ASSESSMENT AND PLAN:   1. Chronic Systolic HF/ Mixed Ischemic/NICM w/ LBBB:  - Echo 6/20 EF 10-15%.(after extraction of CRT) - Echo 12/20 EF 35% s/p MDT CRT-D re-implant (initally removed due to sepsis).  - Echo 12/21 EF 50-55% (normalized with CRT-D) - NYHA II-III.  - Volume status looks good. Continue current dose of torsemide.  - On  low-dose hydral/nitrates, b-blocker and losartan.  - Nephrology did not want her to started SGLT2i but with CAD, HF, DM2 and CKD I think the data clearly support initiating. Will start [place] 5 daily  2. CKD, Stage IV:  - followed by nephrology (Dr. Rolly Salter [facility]) - Baseline creatinine 2.0-2.5 -  As above, start [place] 5 for nephroprotection  3. PAT & NSVT, Frequent PVCs:  - suppressed on amiodarone, 200 mg daily and followed by Dr. Caryl Comes. Denies palpitations.   4. Hypothyroidism:  - on synthroid  5. CAD:   - s/p prior MI>>LAD DES.  - No s/s angina - On Plavix and statin.   6. OSA:  - compliant w/ CPAP using nasal device and chin strap  7. DM2 - managed by PCP - Adding Wilder Glade as above  Glori Bickers, MD  10:02 PM

## 2020-10-16 ENCOUNTER — Other Ambulatory Visit: Payer: Self-pay

## 2020-10-16 ENCOUNTER — Encounter (HOSPITAL_COMMUNITY): Payer: Self-pay | Admitting: Internal Medicine

## 2020-10-16 ENCOUNTER — Other Ambulatory Visit: Payer: Self-pay | Admitting: Family Medicine

## 2020-10-16 ENCOUNTER — Ambulatory Visit (HOSPITAL_COMMUNITY)
Admission: RE | Admit: 2020-10-16 | Discharge: 2020-10-16 | Disposition: A | Discharge status: Home / Self Care | Payer: PPO | Source: Ambulatory Visit | Attending: Internal Medicine | Admitting: Internal Medicine

## 2020-10-16 VITALS — BP 148/80 | HR 60 | Wt 266.2 lb

## 2020-10-16 DIAGNOSIS — Z8052 Family history of malignant neoplasm of bladder: Secondary | ICD-10-CM | POA: Insufficient documentation

## 2020-10-16 DIAGNOSIS — I5082 Biventricular heart failure: Secondary | ICD-10-CM | POA: Insufficient documentation

## 2020-10-16 DIAGNOSIS — I472 Ventricular tachycardia: Secondary | ICD-10-CM | POA: Diagnosis not present

## 2020-10-16 DIAGNOSIS — Z8042 Family history of malignant neoplasm of prostate: Secondary | ICD-10-CM | POA: Insufficient documentation

## 2020-10-16 DIAGNOSIS — Z8349 Family history of other endocrine, nutritional and metabolic diseases: Secondary | ICD-10-CM | POA: Insufficient documentation

## 2020-10-16 DIAGNOSIS — N184 Chronic kidney disease, stage 4 (severe): Secondary | ICD-10-CM | POA: Diagnosis not present

## 2020-10-16 DIAGNOSIS — Z794 Long term (current) use of insulin: Secondary | ICD-10-CM | POA: Diagnosis not present

## 2020-10-16 DIAGNOSIS — G4733 Obstructive sleep apnea (adult) (pediatric): Secondary | ICD-10-CM | POA: Insufficient documentation

## 2020-10-16 DIAGNOSIS — E7800 Pure hypercholesterolemia, unspecified: Secondary | ICD-10-CM | POA: Insufficient documentation

## 2020-10-16 DIAGNOSIS — I5A Non-ischemic myocardial injury (non-traumatic): Secondary | ICD-10-CM | POA: Insufficient documentation

## 2020-10-16 DIAGNOSIS — I130 Hypertensive heart and chronic kidney disease with heart failure and stage 1 through stage 4 chronic kidney disease, or unspecified chronic kidney disease: Secondary | ICD-10-CM | POA: Insufficient documentation

## 2020-10-16 DIAGNOSIS — I255 Ischemic cardiomyopathy: Secondary | ICD-10-CM | POA: Diagnosis not present

## 2020-10-16 DIAGNOSIS — E1142 Type 2 diabetes mellitus with diabetic polyneuropathy: Secondary | ICD-10-CM | POA: Insufficient documentation

## 2020-10-16 DIAGNOSIS — I493 Ventricular premature depolarization: Secondary | ICD-10-CM | POA: Diagnosis not present

## 2020-10-16 DIAGNOSIS — I2510 Atherosclerotic heart disease of native coronary artery without angina pectoris: Secondary | ICD-10-CM | POA: Diagnosis not present

## 2020-10-16 DIAGNOSIS — Z79899 Other long term (current) drug therapy: Secondary | ICD-10-CM | POA: Diagnosis not present

## 2020-10-16 DIAGNOSIS — Z79890 Hormone replacement therapy (postmenopausal): Secondary | ICD-10-CM | POA: Insufficient documentation

## 2020-10-16 DIAGNOSIS — I447 Left bundle-branch block, unspecified: Secondary | ICD-10-CM | POA: Insufficient documentation

## 2020-10-16 DIAGNOSIS — I071 Rheumatic tricuspid insufficiency: Secondary | ICD-10-CM | POA: Diagnosis not present

## 2020-10-16 DIAGNOSIS — I428 Other cardiomyopathies: Secondary | ICD-10-CM | POA: Diagnosis not present

## 2020-10-16 DIAGNOSIS — Z8261 Family history of arthritis: Secondary | ICD-10-CM | POA: Diagnosis not present

## 2020-10-16 DIAGNOSIS — I5022 Chronic systolic (congestive) heart failure: Secondary | ICD-10-CM | POA: Insufficient documentation

## 2020-10-16 DIAGNOSIS — E1122 Type 2 diabetes mellitus with diabetic chronic kidney disease: Secondary | ICD-10-CM | POA: Insufficient documentation

## 2020-10-16 DIAGNOSIS — Z7902 Long term (current) use of antithrombotics/antiplatelets: Secondary | ICD-10-CM | POA: Diagnosis not present

## 2020-10-16 DIAGNOSIS — I252 Old myocardial infarction: Secondary | ICD-10-CM | POA: Insufficient documentation

## 2020-10-16 DIAGNOSIS — Z833 Family history of diabetes mellitus: Secondary | ICD-10-CM | POA: Insufficient documentation

## 2020-10-16 DIAGNOSIS — Z95810 Presence of automatic (implantable) cardiac defibrillator: Secondary | ICD-10-CM | POA: Insufficient documentation

## 2020-10-16 DIAGNOSIS — Z807 Family history of other malignant neoplasms of lymphoid, hematopoietic and related tissues: Secondary | ICD-10-CM | POA: Insufficient documentation

## 2020-10-16 DIAGNOSIS — E039 Hypothyroidism, unspecified: Secondary | ICD-10-CM | POA: Insufficient documentation

## 2020-10-16 DIAGNOSIS — Z8249 Family history of ischemic heart disease and other diseases of the circulatory system: Secondary | ICD-10-CM | POA: Insufficient documentation

## 2020-10-16 DIAGNOSIS — Z825 Family history of asthma and other chronic lower respiratory diseases: Secondary | ICD-10-CM | POA: Insufficient documentation

## 2020-10-16 DIAGNOSIS — Z955 Presence of coronary angioplasty implant and graft: Secondary | ICD-10-CM | POA: Insufficient documentation

## 2020-10-16 DIAGNOSIS — Z89421 Acquired absence of other right toe(s): Secondary | ICD-10-CM | POA: Insufficient documentation

## 2020-10-16 DIAGNOSIS — Z809 Family history of malignant neoplasm, unspecified: Secondary | ICD-10-CM | POA: Insufficient documentation

## 2020-10-16 MED ORDER — EMPAGLIFLOZIN 10 MG PO TABS: 10.0000 mg | ORAL_TABLET | Freq: Every day | ORAL | 6 refills | Status: DC

## 2020-10-16 NOTE — Telephone Encounter (Signed)
Pease advise on refill. Thank you.

## 2020-10-16 NOTE — Patient Instructions (Signed)
Start Jardiance 10 mg Daily  Your provider has prescribed Jardiance for you. Please be aware the most common side effect of this medication is urinary tract infections and yeast infections. Please practice good hygiene and keep this area clean and dry to help prevent this. If you do begin to have symptoms of these infections, such as difficulty urinating or painful urination,  please let us know.  Please call our office in June 2022 to schedule your follow up appointment  If you have any questions or concerns before your next appointment please send [place] a message through [place] or call our office at [phone].    TO LEAVE A MESSAGE FOR THE NURSE SELECT OPTION 2, PLEASE LEAVE A MESSAGE INCLUDING: . YOUR NAME . DATE OF BIRTH . CALL BACK NUMBER . REASON FOR CALL**this is important as we prioritize the call backs  [place] AS LONG AS YOU CALL BEFORE 4:00 PM  At the [facility], you and your health needs are our priority. As part of our continuing mission to provide you with exceptional heart care, we have created designated Provider Care Teams. These Care Teams include your primary Cardiologist (physician) and Advanced Practice Providers (APPs- Physician Assistants and Nurse Practitioners) who all work together to provide you with the care you need, when you need it.   You may see any of the following providers on your designated Care Team at your next follow up: Marland Kitchen Dr Glori Bickers . Dr [name] . Darrick Grinder, NP . Lyda Jester, PA . Audry Riles, PharmD   Please be sure to bring in all your medications bottles to every appointment.

## 2020-10-16 NOTE — Telephone Encounter (Signed)
Sent. Thanks.   

## 2020-10-17 DIAGNOSIS — E1142 Type 2 diabetes mellitus with diabetic polyneuropathy: Secondary | ICD-10-CM | POA: Diagnosis not present

## 2020-10-17 DIAGNOSIS — L97521 Non-pressure chronic ulcer of other part of left foot limited to breakdown of skin: Secondary | ICD-10-CM | POA: Diagnosis not present

## 2020-10-25 DIAGNOSIS — G4733 Obstructive sleep apnea (adult) (pediatric): Secondary | ICD-10-CM | POA: Diagnosis not present

## 2020-10-30 ENCOUNTER — Other Ambulatory Visit (HOSPITAL_COMMUNITY): Payer: Self-pay | Admitting: Internal Medicine

## 2020-10-30 ENCOUNTER — Other Ambulatory Visit: Payer: Self-pay | Admitting: Family Medicine

## 2020-10-30 MED ORDER — AMIODARONE HCL 200 MG PO TABS: 200.0000 mg | ORAL_TABLET | Freq: Every day | ORAL | 1 refills | Status: DC

## 2020-10-30 NOTE — Addendum Note (Signed)
Addended by: Shela Nevin R on: 06/17/6815 02:55 PM   Modules accepted: Orders

## 2020-11-03 ENCOUNTER — Telehealth: Payer: Self-pay | Admitting: Nurse Practitioner

## 2020-11-03 NOTE — Telephone Encounter (Signed)
Called patient to schedule a Palliataive f/u visit with NP, no answer - left message requesting a return call by Wed. 11/08/20 to let us know if she wishes to continue Palliative services or not, since it has been a while since NP has been able to see this patient.  If I don't hear back from patient at that time then we will discharge patient and notify MD

## 2020-11-06 IMAGING — DX PORTABLE CHEST - 1 VIEW
1 series · 1 of 1 positions shown · non-contrast
Comparison: 02/21/2019

CLINICAL DATA: Sepsis

EXAM:
PORTABLE CHEST 1 VIEW

[chest ap]
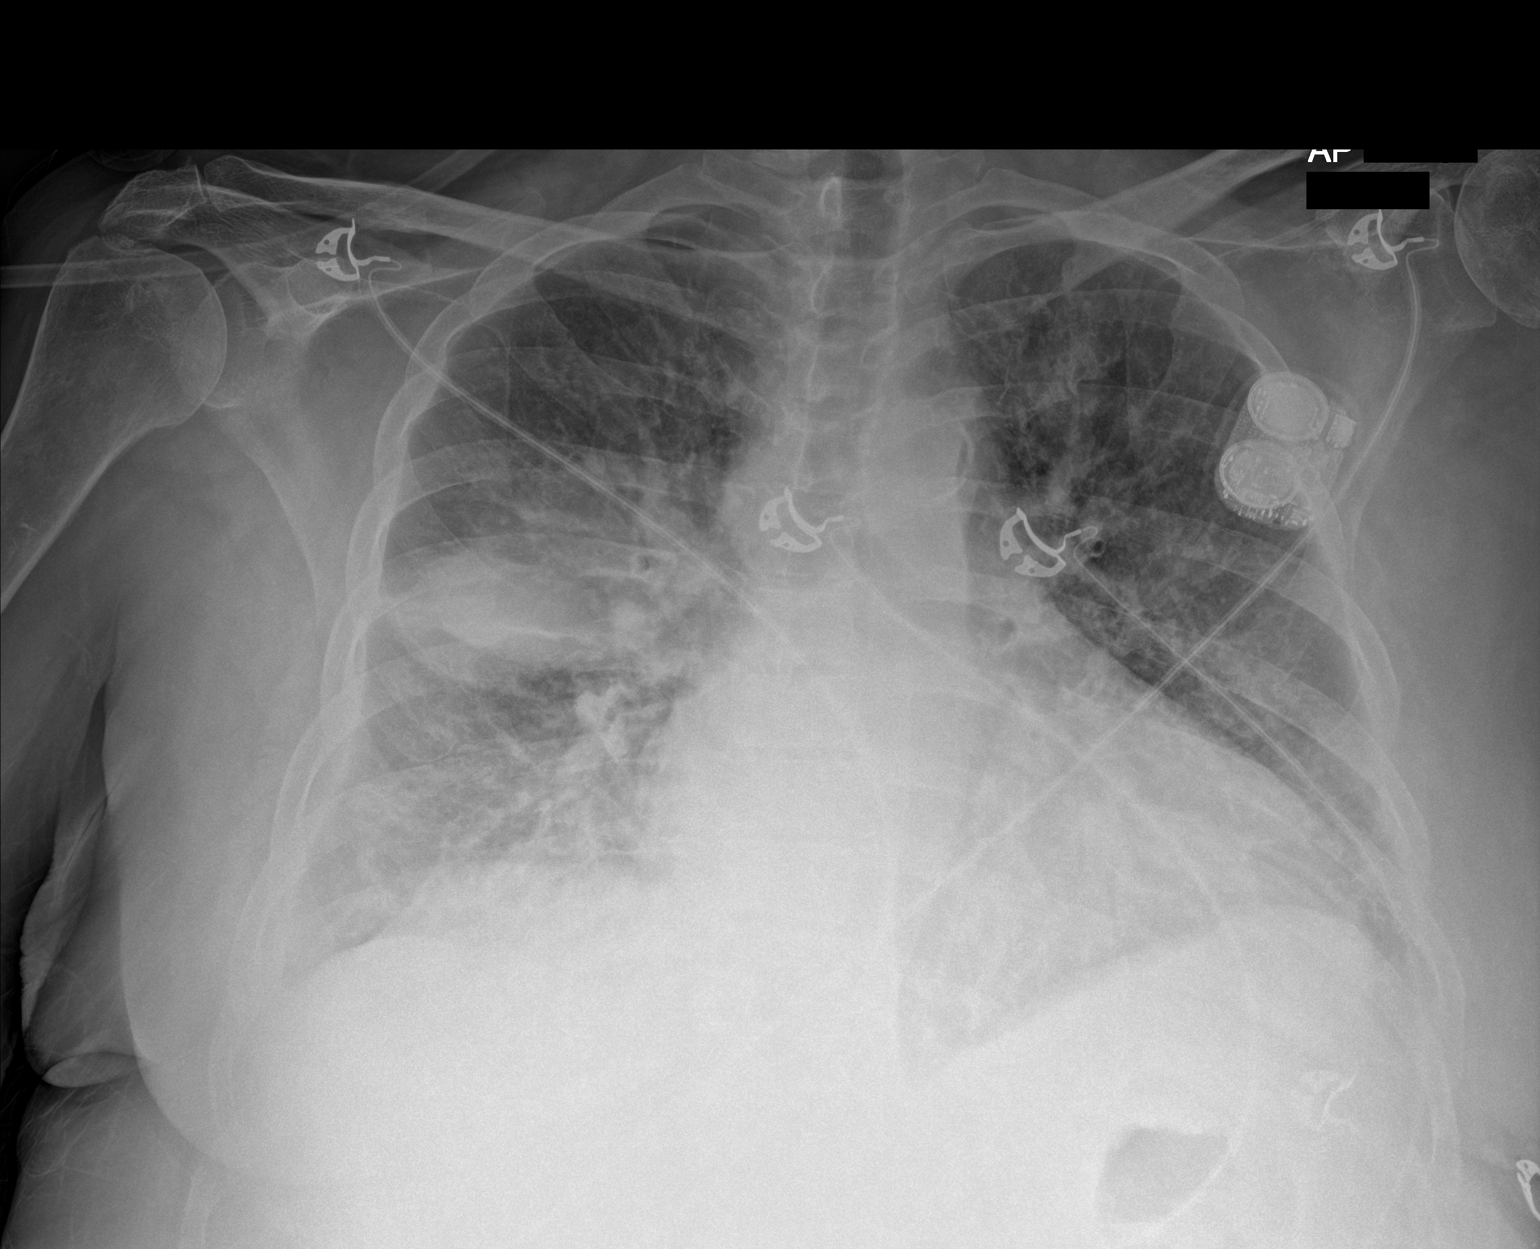

[1 of 1 positions shown; findings below may reference images not displayed]

FINDINGS: The cardiac silhouette remains enlarged. Prominent interstitial lung
markings are again noted. An airspace opacity is again noted in the
right mid lung zone which has not changed significantly since the
prior study. There is no pneumothorax. Small bilateral pleural
effusions are noted. A left-sided lead list pacemaker is noted.
IMPRESSION: Persistent findings of congestive heart failure, not significantly
changed from prior study.

## 2020-11-07 DIAGNOSIS — E1142 Type 2 diabetes mellitus with diabetic polyneuropathy: Secondary | ICD-10-CM | POA: Diagnosis not present

## 2020-11-07 DIAGNOSIS — L97521 Non-pressure chronic ulcer of other part of left foot limited to breakdown of skin: Secondary | ICD-10-CM | POA: Diagnosis not present

## 2020-11-07 DIAGNOSIS — M216X2 Other acquired deformities of left foot: Secondary | ICD-10-CM | POA: Diagnosis not present

## 2020-11-08 ENCOUNTER — Ambulatory Visit
Admission: RE | Admit: 2020-11-08 | Discharge: 2020-11-08 | Disposition: A | Discharge status: Home / Self Care | Payer: PPO | Source: Ambulatory Visit | Attending: Family Medicine | Admitting: Family Medicine

## 2020-11-08 ENCOUNTER — Other Ambulatory Visit: Payer: Self-pay | Admitting: Family Medicine

## 2020-11-08 ENCOUNTER — Other Ambulatory Visit: Payer: Self-pay

## 2020-11-08 DIAGNOSIS — Z1231 Encounter for screening mammogram for malignant neoplasm of breast: Secondary | ICD-10-CM

## 2020-11-10 ENCOUNTER — Other Ambulatory Visit: Payer: Self-pay | Admitting: Cardiovascular Disease

## 2020-11-10 NOTE — Telephone Encounter (Signed)
Rx request sent to pharmacy.  

## 2020-11-13 DIAGNOSIS — J449 Chronic obstructive pulmonary disease, unspecified: Secondary | ICD-10-CM | POA: Diagnosis not present

## 2020-11-13 DIAGNOSIS — N184 Chronic kidney disease, stage 4 (severe): Secondary | ICD-10-CM | POA: Diagnosis not present

## 2020-11-13 DIAGNOSIS — I25118 Atherosclerotic heart disease of native coronary artery with other forms of angina pectoris: Secondary | ICD-10-CM | POA: Diagnosis not present

## 2020-11-13 DIAGNOSIS — Z7902 Long term (current) use of antithrombotics/antiplatelets: Secondary | ICD-10-CM | POA: Diagnosis not present

## 2020-11-13 DIAGNOSIS — I480 Paroxysmal atrial fibrillation: Secondary | ICD-10-CM | POA: Diagnosis not present

## 2020-11-13 DIAGNOSIS — Z7984 Long term (current) use of oral hypoglycemic drugs: Secondary | ICD-10-CM | POA: Diagnosis not present

## 2020-11-13 DIAGNOSIS — N2581 Secondary hyperparathyroidism of renal origin: Secondary | ICD-10-CM | POA: Diagnosis not present

## 2020-11-13 DIAGNOSIS — D6869 Other thrombophilia: Secondary | ICD-10-CM | POA: Diagnosis not present

## 2020-11-13 DIAGNOSIS — G8220 Paraplegia, unspecified: Secondary | ICD-10-CM | POA: Diagnosis not present

## 2020-11-13 DIAGNOSIS — E1122 Type 2 diabetes mellitus with diabetic chronic kidney disease: Secondary | ICD-10-CM | POA: Diagnosis not present

## 2020-11-13 DIAGNOSIS — E1151 Type 2 diabetes mellitus with diabetic peripheral angiopathy without gangrene: Secondary | ICD-10-CM | POA: Diagnosis not present

## 2020-11-13 DIAGNOSIS — I509 Heart failure, unspecified: Secondary | ICD-10-CM | POA: Diagnosis not present

## 2020-11-15 ENCOUNTER — Other Ambulatory Visit: Payer: Self-pay | Admitting: Family Medicine

## 2020-11-24 ENCOUNTER — Inpatient Hospital Stay
Admission: EM | Admit: 2020-11-24 | Discharge: 2020-11-25 | DRG: 638 | Disposition: A | Discharge status: Other Acute Inpatient Hospital | Payer: PPO | Source: Ambulatory Visit | Attending: Family Medicine | Admitting: Family Medicine

## 2020-11-24 ENCOUNTER — Telehealth: Payer: Self-pay

## 2020-11-24 ENCOUNTER — Encounter: Payer: Self-pay | Admitting: Emergency Medicine

## 2020-11-24 ENCOUNTER — Other Ambulatory Visit: Payer: Self-pay

## 2020-11-24 DIAGNOSIS — I10 Essential (primary) hypertension: Secondary | ICD-10-CM | POA: Diagnosis not present

## 2020-11-24 DIAGNOSIS — Z955 Presence of coronary angioplasty implant and graft: Secondary | ICD-10-CM | POA: Diagnosis not present

## 2020-11-24 DIAGNOSIS — U071 COVID-19: Secondary | ICD-10-CM | POA: Diagnosis not present

## 2020-11-24 DIAGNOSIS — Z7902 Long term (current) use of antithrombotics/antiplatelets: Secondary | ICD-10-CM

## 2020-11-24 DIAGNOSIS — L97522 Non-pressure chronic ulcer of other part of left foot with fat layer exposed: Secondary | ICD-10-CM | POA: Diagnosis not present

## 2020-11-24 DIAGNOSIS — Z8249 Family history of ischemic heart disease and other diseases of the circulatory system: Secondary | ICD-10-CM | POA: Diagnosis not present

## 2020-11-24 DIAGNOSIS — Z794 Long term (current) use of insulin: Secondary | ICD-10-CM | POA: Diagnosis not present

## 2020-11-24 DIAGNOSIS — Z79899 Other long term (current) drug therapy: Secondary | ICD-10-CM | POA: Diagnosis not present

## 2020-11-24 DIAGNOSIS — E1165 Type 2 diabetes mellitus with hyperglycemia: Secondary | ICD-10-CM | POA: Diagnosis not present

## 2020-11-24 DIAGNOSIS — Z20822 Contact with and (suspected) exposure to covid-19: Secondary | ICD-10-CM | POA: Diagnosis present

## 2020-11-24 DIAGNOSIS — R7989 Other specified abnormal findings of blood chemistry: Secondary | ICD-10-CM | POA: Diagnosis not present

## 2020-11-24 DIAGNOSIS — Z79890 Hormone replacement therapy (postmenopausal): Secondary | ICD-10-CM

## 2020-11-24 DIAGNOSIS — E11621 Type 2 diabetes mellitus with foot ulcer: Secondary | ICD-10-CM | POA: Diagnosis not present

## 2020-11-24 DIAGNOSIS — L03032 Cellulitis of left toe: Secondary | ICD-10-CM | POA: Diagnosis present

## 2020-11-24 DIAGNOSIS — Z95810 Presence of automatic (implantable) cardiac defibrillator: Secondary | ICD-10-CM

## 2020-11-24 DIAGNOSIS — L02612 Cutaneous abscess of left foot: Secondary | ICD-10-CM

## 2020-11-24 DIAGNOSIS — L03116 Cellulitis of left lower limb: Secondary | ICD-10-CM | POA: Diagnosis not present

## 2020-11-24 DIAGNOSIS — Z89421 Acquired absence of other right toe(s): Secondary | ICD-10-CM | POA: Diagnosis not present

## 2020-11-24 DIAGNOSIS — L97529 Non-pressure chronic ulcer of other part of left foot with unspecified severity: Secondary | ICD-10-CM | POA: Diagnosis not present

## 2020-11-24 DIAGNOSIS — Z833 Family history of diabetes mellitus: Secondary | ICD-10-CM

## 2020-11-24 DIAGNOSIS — J982 Interstitial emphysema: Secondary | ICD-10-CM | POA: Diagnosis not present

## 2020-11-24 DIAGNOSIS — Z7984 Long term (current) use of oral hypoglycemic drugs: Secondary | ICD-10-CM | POA: Diagnosis not present

## 2020-11-24 DIAGNOSIS — I252 Old myocardial infarction: Secondary | ICD-10-CM

## 2020-11-24 DIAGNOSIS — E1142 Type 2 diabetes mellitus with diabetic polyneuropathy: Secondary | ICD-10-CM | POA: Diagnosis not present

## 2020-11-24 DIAGNOSIS — L97528 Non-pressure chronic ulcer of other part of left foot with other specified severity: Secondary | ICD-10-CM

## 2020-11-24 DIAGNOSIS — L97428 Non-pressure chronic ulcer of left heel and midfoot with other specified severity: Secondary | ICD-10-CM | POA: Diagnosis present

## 2020-11-24 DIAGNOSIS — Z89422 Acquired absence of other left toe(s): Secondary | ICD-10-CM | POA: Diagnosis not present

## 2020-11-24 LAB — CBC WITH DIFFERENTIAL/PLATELET
Abs Immature Granulocytes: 0.04 10*3/uL (ref 0.00–0.07)
Basophils Absolute: 0 10*3/uL (ref 0.0–0.1)
Basophils Relative: 0 %
Eosinophils Absolute: 0 10*3/uL (ref 0.0–0.5)
Eosinophils Relative: 0 %
HCT: 38.7 % (ref 36.0–46.0)
Hemoglobin: 12.5 g/dL (ref 12.0–15.0)
Immature Granulocytes: 0 %
Lymphocytes Relative: 12 %
Lymphs Abs: 1.4 10*3/uL (ref 0.7–4.0)
MCH: 30.9 pg (ref 26.0–34.0)
MCHC: 32.3 g/dL (ref 30.0–36.0)
MCV: 95.8 fL (ref 80.0–100.0)
Monocytes Absolute: 0.9 10*3/uL (ref 0.1–1.0)
Monocytes Relative: 8 %
Neutro Abs: 9.7 10*3/uL — ABNORMAL HIGH (ref 1.7–7.7)
Neutrophils Relative %: 80 %
Platelets: 158 10*3/uL (ref 150–400)
RBC: 4.04 MIL/uL (ref 3.87–5.11)
RDW: 13.2 % (ref 11.5–15.5)
WBC: 12.1 10*3/uL — ABNORMAL HIGH (ref 4.0–10.5)
nRBC: 0 % (ref 0.0–0.2)

## 2020-11-24 LAB — COMPREHENSIVE METABOLIC PANEL
ALT: 28 U/L (ref 0–44)
AST: 30 U/L (ref 15–41)
Albumin: 4.1 g/dL (ref 3.5–5.0)
Alkaline Phosphatase: 88 U/L (ref 38–126)
Anion gap: 12 (ref 5–15)
BUN: 65 mg/dL — ABNORMAL HIGH (ref 8–23)
CO2: 29 mmol/L (ref 22–32)
Calcium: 9 mg/dL (ref 8.9–10.3)
Chloride: 93 mmol/L — ABNORMAL LOW (ref 98–111)
Creatinine, Ser: 2.2 mg/dL — ABNORMAL HIGH (ref 0.44–1.00)
GFR, Estimated: 24 mL/min — ABNORMAL LOW (ref >60)
Glucose, Bld: 262 mg/dL — ABNORMAL HIGH (ref 70–99)
Potassium: 4 mmol/L (ref 3.5–5.1)
Sodium: 134 mmol/L — ABNORMAL LOW (ref 135–145)
Total Bilirubin: 0.7 mg/dL (ref 0.3–1.2)
Total Protein: 7.8 g/dL (ref 6.5–8.1)

## 2020-11-24 LAB — RESP PANEL BY RT-PCR (FLU A&B, COVID) ARPGX2
Influenza A by PCR: NEGATIVE
Influenza B by PCR: NEGATIVE
SARS Coronavirus 2 by RT PCR: POSITIVE — AB

## 2020-11-24 LAB — SEDIMENTATION RATE: Sed Rate: 60 mm/hr — ABNORMAL HIGH (ref 0–22)

## 2020-11-24 MED ORDER — SODIUM CHLORIDE 0.9 % IV SOLN: Freq: Once | INTRAVENOUS | Status: AC

## 2020-11-24 MED ORDER — VANCOMYCIN HCL 2000 MG/400ML IV SOLN
2000.0000 mg | Freq: Once | INTRAVENOUS | Status: AC
  Administered 2020-11-24: 2000 mg via INTRAVENOUS
  Filled 2020-11-24: qty 400

## 2020-11-24 MED ORDER — VANCOMYCIN HCL 500 MG/100ML IV SOLN
500.0000 mg | Freq: Once | INTRAVENOUS | Status: AC
  Administered 2020-11-25: 500 mg via INTRAVENOUS
  Filled 2020-11-24: qty 100

## 2020-11-24 MED ORDER — METRONIDAZOLE IN NACL 5-0.79 MG/ML-% IV SOLN
500.0000 mg | Freq: Once | INTRAVENOUS | Status: AC
  Administered 2020-11-25: 500 mg via INTRAVENOUS
  Filled 2020-11-24: qty 100

## 2020-11-24 MED ORDER — SODIUM CHLORIDE 0.9 % IV SOLN
2.0000 g | Freq: Two times a day (BID) | INTRAVENOUS | Status: AC
  Administered 2020-11-24: 2 g via INTRAVENOUS
  Filled 2020-11-24: qty 2

## 2020-11-24 MED ORDER — METRONIDAZOLE IN NACL 5-0.79 MG/ML-% IV SOLN: 500.0000 mg | Freq: Three times a day (TID) | INTRAVENOUS | Status: DC

## 2020-11-24 NOTE — ED Provider Notes (Signed)
[facility] Emergency Department Provider Note  ____________________________________________   Event Date/Time   First MD Initiated Contact with Patient 11/24/20 1933     (approximate)  I have reviewed the triage vital signs and the nursing notes.   HISTORY  Chief Complaint Wound Infection    HPI [name] is a 65 y.o. female  Sent from [facility] for evaluation of foot wound. Pt has had left foot wound for 1 month, for which she has been seeing Podiatry. She has chronic HTN, DM, h/o osteo and prior amputations. Reports that over past 24 hours, her wound has worsened and is now "a hole," and she has had associated redness and pain. It was not painful until today. Pain is aching, throbbing, 4-5 with ambulation only. She normally has very diminished sensation in her feet. Reports she went ot podiatry and had an XR concerning for subQ air, was sent here for admission and antibiotics. Pain not present at rest. Denies known fevers.        Past Medical History:  Diagnosis Date  . Acute myocardial infarction, subendocardial infarction, initial episode of care ([place]) 07/21/2012  . Acute osteomyelitis involving ankle and foot ([place]) 02/06/2015  . Acute systolic heart failure ([place]) 07/21/2012   New onset 07/19/12; admission to [facility] ED. Elevated Troponins.  S/p 2D-echo with EF 20-25%.  S/p cardiac catheterization with stenting LAD.  Repeat 2D-echo 10/2011 with improved EF of 35%.   . Anemia   . Automatic implantable cardioverter-defibrillator in situ    a. MDT CRT-D 06/2014, SN: [id number]  .removed in feb 2020  . CAD (coronary artery disease)    a. cardiac cath 101/04/2012: PCI/DES to chronically occluded mLAD, consideration PCI to diag branch in 4 weeks.   . Cataract   . Chicken pox   . Chronic kidney disease   . Chronic systolic CHF (congestive heart failure) (HCC)    a. mixed ICM & NICM; b. EF 20-25% by echo 07/2012, mid-dist 2/3 of LV sev HK/AK, mild MR. echo  10/2012: EF 30-35%, sev HK ant-septal & inf walls, GR1DD, mild MR, PASP 33. c. echo 02/2013: EF 30%, GR1DD, mild MR. echo 04/2014: EF 30%, Septal-lat dyssynchrony, global HK, inf AK, GR1DD, mild MR. d. echo 10/2014: EF50-55%, WM nl, GR1DD, septal mild paradox. e. echo 02/2015: EF 50-55%, wm   . Depression   . Heart attack ([place])   . Heel ulcer ([place]) 04/27/2015  . History of blood transfusion ~ 2011   "plasma; had neuropathy; couldn't walk"  . Hypertension   . LBBB (left bundle branch block)   . Neuromuscular disorder ([place])   . Neuropathy 2011  . Obesity, unspecified   . OSA on CPAP    Moderate with AHI 23/hr and now on CPAP at 16cm H2O.  Her DME is [facility]  . Pure hypercholesterolemia   . Sepsis ([place]) 09/2018  . Type II diabetes mellitus ([place])   . Unspecified vitamin D deficiency     Patient Active Problem List   Diagnosis Date Noted  . Diabetic foot ulcer ([place]) 11/24/2020  . Advance care planning 10/04/2020  . Secondary hyperparathyroidism of renal origin ([place]) 07/20/2019  . Hyperkalemia 07/20/2019  . Benign hypertensive kidney disease with chronic kidney disease 07/20/2019  . Anemia in chronic kidney disease 07/20/2019  . Positive antinuclear antibody 07/20/2019  . Proteinuria 07/20/2019  . Acute on chronic systolic (congestive) heart failure ([place]) 03/23/2019  . UTI (urinary tract infection) 03/09/2019  . CAD (coronary artery disease)  03/09/2019  . DM type 2, uncontrolled, with renal complications ([place]) 49/44/9675  . Atrial tachycardia ([place])   . Acute kidney injury superimposed on chronic kidney disease ([place])   . AKI (acute kidney injury) ([place])   . Weakness generalized 12/22/2018  . Shortness of breath 12/22/2018  . Palliative care encounter 12/22/2018  . Endocarditis 12/14/2018  . NSTEMI (non-ST elevated myocardial infarction) ([place]) 11/22/2018  . Acute on chronic combined systolic and diastolic CHF (congestive heart failure) ([place]) 10/28/2018  . Lymphedema 10/28/2018  . Severe  sepsis ([place]) 09/28/2018  . History of amputation of lesser toe of right foot ([place]) 04/19/2018  . Presence of cardiac resynchronization therapy defibrillator (CRT-D) 04/19/2018  . Paraparesis of both lower limbs ([place]) 03/29/2018  . 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 Paroxysmal atrial fibrillation ([place]) 03/29/2018  . Class 3 obesity due to excess calories with serious comorbidity and body mass index (BMI) of 40.0 to 44.9 in adult 09/02/2016  . History of CVA (cerebrovascular accident) 11/11/2015  . Chronic renal insufficiency 08/04/2015  . OSA (obstructive sleep apnea) 08/15/2014  . Morbid obesity ([place]) 10/26/2013  . Essential hypertension, benign 04/29/2013  . Pure hypercholesterolemia 12/07/2012  . Iron deficiency anemia 12/07/2012  . Diabetic peripheral neuropathy associated with type 2 diabetes mellitus ([place]) 12/07/2012  . Chronic systolic congestive heart failure ([place]) 07/29/2012  . Left bundle-branch block 07/25/2012  . Coronary artery disease involving native coronary artery of native heart without angina pectoris 07/25/2012    Past Surgical History:  Procedure Laterality Date  . AMPUTATION TOE Right 06/18/2015   Procedure: AMPUTATION TOE;  Surgeon: Samara Deist, DPM;  Location: [facility] ORS;  Service: Podiatry;  Laterality: Right;  . AMPUTATION TOE Left 10/08/2018   Procedure: AMPUTATION TOE LEFT 2ND;  Surgeon: Albertine Patricia, DPM;  Location: [facility] ORS;  Service: Podiatry;  Laterality: Left;  . BACK SURGERY    . BI-VENTRICULAR IMPLANTABLE CARDIOVERTER DEFIBRILLATOR N/A 07/06/2014   Procedure: BI-VENTRICULAR IMPLANTABLE CARDIOVERTER DEFIBRILLATOR  (CRT-D);  Surgeon: Deboraha Sprang, MD;  Location: [facility] CATH LAB;  Service: Cardiovascular;  Laterality: N/A;  . BI-VENTRICULAR IMPLANTABLE CARDIOVERTER DEFIBRILLATOR  (CRT-D)  07/06/2014  . BILATERAL OOPHORECTOMY  01/2011   ovarian cyst benign  . BIV ICD INSERTION CRT-D N/A 03/31/2019   Procedure: BIV ICD INSERTION CRT-D;  Surgeon:  Constance Haw, MD;  Location: [place] CV LAB;  Service: Cardiovascular;  Laterality: N/A;  . COLONOSCOPY WITH PROPOFOL Left 02/22/2015   Procedure: COLONOSCOPY WITH PROPOFOL;  Surgeon: Hulen Luster, MD;  Location: [facility] ENDOSCOPY;  Service: Endoscopy;  Laterality: Left;  . CORONARY ANGIOPLASTY WITH STENT PLACEMENT Left 07/2012   new onset systolic CHF; elevated troponins.  Cardiac catheterization with stenting to LAD; EF 15%.  2D-echo: EF 20-25%.  . ESOPHAGOGASTRODUODENOSCOPY N/A 02/22/2015   Procedure: ESOPHAGOGASTRODUODENOSCOPY (EGD);  Surgeon: Hulen Luster, MD;  Location: [facility] ENDOSCOPY;  Service: Endoscopy;  Laterality: N/A;  . INCISION AND DRAINAGE ABSCESS Right 2007   groin; with ICU stay due to sepsis.  Marland Kitchen LAPAROSCOPIC CHOLECYSTECTOMY  2011  . LEFT HEART CATH AND CORONARY ANGIOGRAPHY N/A 10/05/2018   Procedure: LEFT HEART CATH AND CORONARY ANGIOGRAPHY;  Surgeon: Minna Merritts, MD;  Location: [place] CV LAB;  Service: Cardiovascular;  Laterality: N/A;  . LEFT HEART CATHETERIZATION WITH CORONARY ANGIOGRAM N/A 07/21/2012   Procedure: LEFT HEART CATHETERIZATION WITH CORONARY ANGIOGRAM;  Surgeon: Jolaine Artist, MD;  Location: [facility] CATH LAB;  Service: Cardiovascular;  Laterality: N/A;  . [place]   L4-5  .  PACEMAKER REMOVAL  11/2018   due to infection around pacemaker  . PERCUTANEOUS CORONARY STENT INTERVENTION (PCI-S) N/A 07/23/2012   Procedure: PERCUTANEOUS CORONARY STENT INTERVENTION (PCI-S);  Surgeon: Sherren Mocha, MD;  Location: [facility] CATH LAB;  Service: Cardiovascular;  Laterality: N/A;  . PERIPHERAL VASCULAR BALLOON ANGIOPLASTY Left 10/06/2018   Procedure: PERIPHERAL VASCULAR BALLOON ANGIOPLASTY;  Surgeon: Katha Cabal, MD;  Location: [place] CV LAB;  Service: Cardiovascular;  Laterality: Left;  . PERIPHERAL VASCULAR CATHETERIZATION N/A 02/10/2015   Procedure: Picc Line Insertion;  Surgeon: Katha Cabal, MD;  Location: [place] CV LAB;   Service: Cardiovascular;  Laterality: N/A;  . RIGHT HEART CATH N/A 03/29/2019   Procedure: RIGHT HEART CATH;  Surgeon: Jolaine Artist, MD;  Location: [place] CV LAB;  Service: Cardiovascular;  Laterality: N/A;  . TEE WITHOUT CARDIOVERSION N/A 10/02/2018   Procedure: TRANSESOPHAGEAL ECHOCARDIOGRAM (TEE);  Surgeon: Wellington Hampshire, MD;  Location: [facility] ORS;  Service: Cardiovascular;  Laterality: N/A;  . [place]  . VAGINAL HYSTERECTOMY  01/2011   Fibroids/DUB.  Ovaries removed. [place].    Prior to Admission medications   Medication Sig Start Date End Date Taking? Authorizing Provider  amiodarone (PACERONE) 200 MG tablet Take 1 tablet (200 mg total) by mouth daily. 10/30/20   Bensimhon, Shaune Pascal, MD  Calcium Carb-Cholecalciferol (CALCIUM CARBONATE-VITAMIN D3) 600-400 MG-UNIT TABS Take 1 tablet by mouth 2 (two) times a day.    [provider]  carvedilol (COREG) 3.125 MG tablet Take 3.125 mg by mouth daily.    [provider]  clopidogrel (PLAVIX) 75 MG tablet TAKE 1 TABLET BY MOUTH ONCE DAILY 11/10/20   Minna Merritts, MD  DULoxetine (CYMBALTA) 30 MG capsule Take 1 capsule (30 mg total) by mouth at bedtime. 09/15/20   Narda Amber K, DO  empagliflozin (JARDIANCE) 10 MG TABS tablet Take 1 tablet (10 mg total) by mouth daily before breakfast. 10/16/20   Bensimhon, Shaune Pascal, MD  ferrous sulfate 325 (65 FE) MG tablet Take 325 mg by mouth 2 (two) times daily with a meal.    [provider]  gabapentin (NEURONTIN) 600 MG tablet TAKE 1 TABLET BY IN THE MORNING AND 2 ATBEDTIME 10/02/20   [name], MD  insulin aspart (NOVOLOG FLEXPEN) 100 UNIT/ML FlexPen Inject 5 Units into the skin 2 (two) times daily with a meal. 09/28/20   [name], MD  Insulin Pen Needle (PEN NEEDLES) 32G X 6 MM MISC 1 each by Does not apply route 4 (four) times daily. 09/06/19   Daleen Squibb, MD  isosorbide mononitrate (IMDUR) 30 MG 24 hr tablet Take 1 tablet  (30 mg total) by mouth daily. Needs appt 05/16/20   Bensimhon, Shaune Pascal, MD  LANTUS SOLOSTAR 100 UNIT/ML Solostar Pen INJECT 22 UNITS UNDER THE SKIN DAILY. 10/02/20   [name], MD  levothyroxine (SYNTHROID) 50 MCG tablet TAKE 1 TABLET BY MOUTH ONCE DAILY BEFOREBREAKFAST 10/30/20   [name], MD  losartan (COZAAR) 25 MG tablet Take 0.5 tablets (12.5 mg total) by mouth daily. Needs appt 05/16/20   Bensimhon, Shaune Pascal, MD  Multiple Vitamin (MULTIVITAMIN) tablet Take 1 tablet by mouth daily.    [provider]  nitroGLYCERIN (NITROSTAT) 0.4 MG SL tablet Place 1 tablet (0.4 mg total) under the tongue every 5 (five) minutes as needed for chest pain. 03/28/20 06/26/20  Deboraha Sprang, MD  Omega-3 Fatty Acids (FISH OIL) 1000 MG CAPS Take 1  capsule by mouth 2 (two) times a day.     [provider]  ONETOUCH ULTRA test strip UUD TO CHECK BLOOD SUGAR 3 TIMES DAILY 03/02/20   Jacelyn Pi, Lilia Argue, MD  rosuvastatin (CRESTOR) 10 MG tablet TAKE 1 TABLET BY MOUTH ONCE DAILY 11/15/20   [name], MD  torsemide (DEMADEX) 20 MG tablet TAKE 4 TABLETS (80 MG TOTAL) IN THE MORNING AND 2 TABS (40 MG) IN THE AFTERNOON. 11/10/20   Minna Merritts, MD  traMADol (ULTRAM) 50 MG tablet TAKE 1 TO 2 TABLETS BY MOUTH AT BEDTIME AS NEEDED 10/16/20   [name], MD    Allergies Patient has no known allergies.  Family History  Problem Relation Age of Onset  . Diabetes Mother   . Hypertension Mother   . Arthritis Mother        knees, lumbar DDD, cervical DDD  . Cancer Father        prostate,skin,lymphoma.  . Cancer Brother 28       bladder cancer; non-smoker  . Diabetes Maternal Grandmother   . Heart disease Maternal Grandmother   . COPD Maternal Grandmother   . Diabetes Paternal Grandmother   . Obesity Brother   . Diabetes Son   . Hypertension Son   . Cancer Maternal Grandfather   . Diabetes Paternal Grandfather     Social History Social History   Tobacco Use  . Smoking  status: Never Smoker  . Smokeless tobacco: Never Used  Vaping Use  . Vaping Use: Never used  Substance Use Topics  . Alcohol use: No    Alcohol/week: 0.0 standard drinks  . Drug use: No    Review of Systems  Review of Systems  Constitutional: Negative for chills, fatigue and fever.  HENT: Negative for congestion and sore throat.   Eyes: Negative for visual disturbance.  Respiratory: Negative for cough and shortness of breath.   Cardiovascular: Negative for chest pain.  Gastrointestinal: Negative for abdominal pain, diarrhea, nausea and vomiting.  Genitourinary: Negative for flank pain.  Musculoskeletal: Positive for gait problem. Negative for back pain and neck pain.  Skin: Positive for rash and wound.  Allergic/Immunologic: Negative for immunocompromised state.  Neurological: Negative for weakness and numbness.  Hematological: Does not bruise/bleed easily.     ____________________________________________  PHYSICAL EXAM:      VITAL SIGNS: ED Triage Vitals  Enc Vitals Group     BP 11/24/20 1734 140/62     Pulse Rate 11/24/20 1734 68     Resp 11/24/20 1734 16     Temp 11/24/20 1737 99.2 F (37.3 C)     Temp Source 11/24/20 1737 Oral     SpO2 11/24/20 1734 99 %     Weight 11/24/20 1734 260 lb (117.9 kg)     Height 11/24/20 1734 5' 8"  (1.727 m)     Head Circumference --      Peak Flow --      Pain Score 11/24/20 1734 5     Pain Loc --      Pain Edu? --      Excl. in [place]? --      Physical Exam Vitals and nursing note reviewed.  Constitutional:      General: She is not in acute distress.    Appearance: She is well-developed and well-nourished.  HENT:     Head: Normocephalic and atraumatic.  Eyes:     Conjunctiva/sclera: Conjunctivae normal.  Cardiovascular:     Rate and Rhythm:  Normal rate and regular rhythm.     Heart sounds: Normal heart sounds.  Pulmonary:     Effort: Pulmonary effort is normal. No respiratory distress.     Breath sounds: No wheezing.   Abdominal:     General: There is no distension.  Musculoskeletal:        General: No edema.     Cervical back: Neck supple.  Skin:    General: Skin is warm.     Capillary Refill: Capillary refill takes less than 2 seconds.     Findings: No rash.  Neurological:     Mental Status: She is alert and oriented to person, place, and time.     Motor: No abnormal muscle tone.      LOWER EXTREMITY EXAM: LEFT  INSPECTION & PALPATION: Ulceration to base of first MTP along sole of foot, with marked surrounding edema and erythema extending to dorsal midfoot, along with expressible serous drainage. No overt purulence. S/p amputation of second toe.  SENSORY: sensation is globally diminished in: Superficial peroneal nerve distribution (over dorsum of foot) Deep peroneal nerve distribution (over first dorsal web space) Sural nerve distribution (over lateral aspect 5th metatarsal) Saphenous nerve distribution (over medial instep)  MOTOR:  + Motor EHL (great toe dorsiflexion) + FHL (great toe plantar flexion)  + TA (ankle dorsiflexion)  + GSC (ankle plantar flexion)  VASCULAR: 1+ dorsalis pedis and posterior tibialis pulses Capillary refill < 2 sec, toes warm and well-perfused  ____________________________________________   LABS (all labs ordered are listed, but only abnormal results are displayed)  Labs Reviewed  RESP PANEL BY RT-PCR (FLU A&B, COVID) ARPGX2 - Abnormal; Notable for the following components:      Result Value   SARS Coronavirus 2 by RT PCR POSITIVE (*)    All other components within normal limits  CBC WITH DIFFERENTIAL/PLATELET - Abnormal; Notable for the following components:   WBC 12.1 (*)    Neutro Abs 9.7 (*)    All other components within normal limits  COMPREHENSIVE METABOLIC PANEL - Abnormal; Notable for the following components:   Sodium 134 (*)    Chloride 93 (*)    Glucose, Bld 262 (*)    BUN 65 (*)    Creatinine, Ser 2.20 (*)    GFR, Estimated 24 (*)     All other components within normal limits  SEDIMENTATION RATE - Abnormal; Notable for the following components:   Sed Rate 60 (*)    All other components within normal limits  CULTURE, BLOOD (ROUTINE X 2)  CULTURE, BLOOD (ROUTINE X 2)  AEROBIC CULTURE W GRAM STAIN (SUPERFICIAL SPECIMEN)  C-REACTIVE PROTEIN    ____________________________________________  EKG:  ________________________________________  RADIOLOGY All imaging, including plain films, CT scans, and ultrasounds, independently reviewed by me, and interpretations confirmed via formal radiology reads.  ED MD interpretation:   MR: Pending  Official radiology report(s): No results found.  ____________________________________________  PROCEDURES   Procedure(s) performed (including Critical Care):  Procedures  ____________________________________________  INITIAL IMPRESSION / MDM / [place] / ED COURSE  As part of my medical decision making, I reviewed the following data within the [place] notes reviewed and incorporated, Old chart reviewed, Notes from prior ED visits, and [place] Controlled Substance Database       *Amnah Breuer Forgue was evaluated in Emergency Department on 11/24/2020 for the symptoms described in the history of present illness. She was evaluated in the context of the global COVID-19 pandemic, which necessitated  consideration that the patient might be at risk for infection with the SARS-CoV-2 virus that causes COVID-19. Institutional protocols and algorithms that pertain to the evaluation of patients at risk for COVID-19 are in a state of rapid change based on information released by regulatory bodies including the CDC and federal and state organizations. These policies and algorithms were followed during the patient's care in the ED.  Some ED evaluations and interventions may be delayed as a result of limited staffing during the pandemic.*     Medical Decision  Making:  65 yo F here with likely infected diabetic ulcer to left first toe. H/o osteo and prior amputations. Pt is HDS on arrival but does have moderate leukocytosis on lab work, and exam is c/f streaking erythema. CMP with hyperglycemia w/o DKA. ESR, CRP sent and wound, blood cx sent. Will start empiric ABX. Discussed with Dr. Luana Shu of Podiatry who recommends MRI for evaluation of deep infection. Unfortunately, pt has PM so cannot obtain MRI here. However, I have confirmed with MR techs that pt can receive MR at [facility]. Will consult hospitalists at [facility] for admission.  ____________________________________________  FINAL CLINICAL IMPRESSION(S) / ED DIAGNOSES  Final diagnoses:  Diabetic ulcer of toe of left foot associated with type 2 diabetes mellitus, with other ulcer severity ([place])  Abscess or cellulitis of toe, left     MEDICATIONS GIVEN DURING THIS VISIT:  Medications  vancomycin (VANCOREADY) IVPB 2000 mg/400 mL (2,000 mg Intravenous New Bag/Given 11/24/20 2154)    Followed by  vancomycin (VANCOREADY) IVPB 500 mg/100 mL (has no administration in time range)  metroNIDAZOLE (FLAGYL) IVPB 500 mg (has no administration in time range)  0.9 %  sodium chloride infusion ( Intravenous New Bag/Given 11/24/20 2042)  ceFEPIme (MAXIPIME) 2 g in sodium chloride 0.9 % 100 mL IVPB (0 g Intravenous Stopped 11/24/20 2153)     ED Discharge Orders    None       Note:  This document was prepared using Dragon voice recognition software and may include unintentional dictation errors.   Duffy Bruce, MD 11/24/20 2238

## 2020-11-24 NOTE — Consult Note (Signed)
PHARMACY -  BRIEF ANTIBIOTIC NOTE   Pharmacy has received consult(s) for Zsoyn and Vancomycin from an ED provider.  The patient's profile has been reviewed for ht/wt/allergies/indication/available labs.    After discussion with ordering provider, one time order(s) placed for  Flagyl 500 mg  Vancomycin 2500 mg  Cefepime 2 gram   Further antibiotics/pharmacy consults should be ordered by admitting physician if indicated.                       Thank you, Dorothe Pea, PharmD, BCPS Clinical Pharmacist  11/24/2020  8:45 PM

## 2020-11-24 NOTE — Telephone Encounter (Signed)
Volunteer called patient on behalf of Palliative Care and did not get a answer from patient/family. ° °

## 2020-11-24 NOTE — ED Notes (Signed)
First RN note:  Pt sent over by [facility] for IV antibiotics for foot infection.

## 2020-11-24 NOTE — Progress Notes (Signed)
Patient accepted to med surg bed at [facility]. Requires transfer to [facility] from [facility] because she needs an MRI to rule out osteomyelitis, but their machine can't image patients with ICDs. [facility] hospitalists to assume care when patient arrives at accepting facility.

## 2020-11-24 NOTE — ED Triage Notes (Signed)
Pt to ED via POV from [facility] for wound infection on left foot. Pt states that the wound has been there for about 1 month. Pt reports that when her mother was dressing the wound today she noted that it was red and draining. Pt went to in see her foot doctor and he did an x-ray, he stated that he saw an "air pocket" on the x-ray and recommended that pt come to the ED for MRI of the foot and possible admission for IV antibiotics. Pt is in NAD.

## 2020-11-25 ENCOUNTER — Inpatient Hospital Stay (HOSPITAL_COMMUNITY)
Admission: RE | Admit: 2020-11-25 | Discharge: 2020-11-30 | DRG: 622 | Disposition: A | Discharge status: Home Health | Payer: PPO | Source: Other Acute Inpatient Hospital | Attending: Internal Medicine | Admitting: Internal Medicine

## 2020-11-25 ENCOUNTER — Inpatient Hospital Stay (HOSPITAL_COMMUNITY): Payer: PPO

## 2020-11-25 ENCOUNTER — Encounter (HOSPITAL_COMMUNITY): Payer: Self-pay | Admitting: Family Medicine

## 2020-11-25 DIAGNOSIS — I447 Left bundle-branch block, unspecified: Secondary | ICD-10-CM | POA: Diagnosis present

## 2020-11-25 DIAGNOSIS — Z955 Presence of coronary angioplasty implant and graft: Secondary | ICD-10-CM

## 2020-11-25 DIAGNOSIS — E0843 Diabetes mellitus due to underlying condition with diabetic autonomic (poly)neuropathy: Secondary | ICD-10-CM | POA: Diagnosis not present

## 2020-11-25 DIAGNOSIS — Z8249 Family history of ischemic heart disease and other diseases of the circulatory system: Secondary | ICD-10-CM

## 2020-11-25 DIAGNOSIS — I2510 Atherosclerotic heart disease of native coronary artery without angina pectoris: Secondary | ICD-10-CM | POA: Diagnosis present

## 2020-11-25 DIAGNOSIS — G4733 Obstructive sleep apnea (adult) (pediatric): Secondary | ICD-10-CM | POA: Diagnosis not present

## 2020-11-25 DIAGNOSIS — L02612 Cutaneous abscess of left foot: Secondary | ICD-10-CM | POA: Diagnosis present

## 2020-11-25 DIAGNOSIS — I130 Hypertensive heart and chronic kidney disease with heart failure and stage 1 through stage 4 chronic kidney disease, or unspecified chronic kidney disease: Secondary | ICD-10-CM | POA: Diagnosis not present

## 2020-11-25 DIAGNOSIS — F32A Depression, unspecified: Secondary | ICD-10-CM | POA: Diagnosis present

## 2020-11-25 DIAGNOSIS — E7800 Pure hypercholesterolemia, unspecified: Secondary | ICD-10-CM | POA: Diagnosis not present

## 2020-11-25 DIAGNOSIS — M7989 Other specified soft tissue disorders: Secondary | ICD-10-CM | POA: Diagnosis not present

## 2020-11-25 DIAGNOSIS — E1165 Type 2 diabetes mellitus with hyperglycemia: Secondary | ICD-10-CM

## 2020-11-25 DIAGNOSIS — Z79890 Hormone replacement therapy (postmenopausal): Secondary | ICD-10-CM

## 2020-11-25 DIAGNOSIS — N184 Chronic kidney disease, stage 4 (severe): Secondary | ICD-10-CM | POA: Diagnosis present

## 2020-11-25 DIAGNOSIS — Z6841 Body Mass Index (BMI) 40.0 and over, adult: Secondary | ICD-10-CM

## 2020-11-25 DIAGNOSIS — Z794 Long term (current) use of insulin: Secondary | ICD-10-CM

## 2020-11-25 DIAGNOSIS — E878 Other disorders of electrolyte and fluid balance, not elsewhere classified: Secondary | ICD-10-CM | POA: Diagnosis present

## 2020-11-25 DIAGNOSIS — Z89421 Acquired absence of other right toe(s): Secondary | ICD-10-CM

## 2020-11-25 DIAGNOSIS — E785 Hyperlipidemia, unspecified: Secondary | ICD-10-CM | POA: Diagnosis not present

## 2020-11-25 DIAGNOSIS — L97522 Non-pressure chronic ulcer of other part of left foot with fat layer exposed: Secondary | ICD-10-CM | POA: Diagnosis not present

## 2020-11-25 DIAGNOSIS — L03116 Cellulitis of left lower limb: Secondary | ICD-10-CM | POA: Diagnosis not present

## 2020-11-25 DIAGNOSIS — R0602 Shortness of breath: Secondary | ICD-10-CM

## 2020-11-25 DIAGNOSIS — I5043 Acute on chronic combined systolic (congestive) and diastolic (congestive) heart failure: Secondary | ICD-10-CM | POA: Diagnosis not present

## 2020-11-25 DIAGNOSIS — Z7902 Long term (current) use of antithrombotics/antiplatelets: Secondary | ICD-10-CM | POA: Diagnosis not present

## 2020-11-25 DIAGNOSIS — E871 Hypo-osmolality and hyponatremia: Secondary | ICD-10-CM | POA: Diagnosis not present

## 2020-11-25 DIAGNOSIS — R918 Other nonspecific abnormal finding of lung field: Secondary | ICD-10-CM | POA: Diagnosis not present

## 2020-11-25 DIAGNOSIS — I252 Old myocardial infarction: Secondary | ICD-10-CM | POA: Diagnosis not present

## 2020-11-25 DIAGNOSIS — L03032 Cellulitis of left toe: Secondary | ICD-10-CM | POA: Diagnosis not present

## 2020-11-25 DIAGNOSIS — Z20822 Contact with and (suspected) exposure to covid-19: Secondary | ICD-10-CM | POA: Diagnosis not present

## 2020-11-25 DIAGNOSIS — I480 Paroxysmal atrial fibrillation: Secondary | ICD-10-CM | POA: Diagnosis not present

## 2020-11-25 DIAGNOSIS — N189 Chronic kidney disease, unspecified: Secondary | ICD-10-CM | POA: Diagnosis not present

## 2020-11-25 DIAGNOSIS — Z9851 Tubal ligation status: Secondary | ICD-10-CM

## 2020-11-25 DIAGNOSIS — L97428 Non-pressure chronic ulcer of left heel and midfoot with other specified severity: Secondary | ICD-10-CM | POA: Diagnosis not present

## 2020-11-25 DIAGNOSIS — R7989 Other specified abnormal findings of blood chemistry: Secondary | ICD-10-CM | POA: Diagnosis not present

## 2020-11-25 DIAGNOSIS — I5042 Chronic combined systolic (congestive) and diastolic (congestive) heart failure: Secondary | ICD-10-CM | POA: Diagnosis present

## 2020-11-25 DIAGNOSIS — Z833 Family history of diabetes mellitus: Secondary | ICD-10-CM

## 2020-11-25 DIAGNOSIS — N179 Acute kidney failure, unspecified: Secondary | ICD-10-CM | POA: Diagnosis present

## 2020-11-25 DIAGNOSIS — Z7984 Long term (current) use of oral hypoglycemic drugs: Secondary | ICD-10-CM

## 2020-11-25 DIAGNOSIS — E039 Hypothyroidism, unspecified: Secondary | ICD-10-CM | POA: Diagnosis present

## 2020-11-25 DIAGNOSIS — Z95810 Presence of automatic (implantable) cardiac defibrillator: Secondary | ICD-10-CM | POA: Diagnosis not present

## 2020-11-25 DIAGNOSIS — U071 COVID-19: Secondary | ICD-10-CM | POA: Diagnosis not present

## 2020-11-25 DIAGNOSIS — L0390 Cellulitis, unspecified: Secondary | ICD-10-CM | POA: Diagnosis not present

## 2020-11-25 DIAGNOSIS — M869 Osteomyelitis, unspecified: Secondary | ICD-10-CM | POA: Diagnosis present

## 2020-11-25 DIAGNOSIS — E1122 Type 2 diabetes mellitus with diabetic chronic kidney disease: Secondary | ICD-10-CM | POA: Diagnosis not present

## 2020-11-25 DIAGNOSIS — J984 Other disorders of lung: Secondary | ICD-10-CM | POA: Diagnosis not present

## 2020-11-25 DIAGNOSIS — Z90710 Acquired absence of both cervix and uterus: Secondary | ICD-10-CM

## 2020-11-25 DIAGNOSIS — E11621 Type 2 diabetes mellitus with foot ulcer: Principal | ICD-10-CM | POA: Diagnosis present

## 2020-11-25 DIAGNOSIS — E1169 Type 2 diabetes mellitus with other specified complication: Secondary | ICD-10-CM | POA: Diagnosis not present

## 2020-11-25 DIAGNOSIS — Z79899 Other long term (current) drug therapy: Secondary | ICD-10-CM | POA: Diagnosis not present

## 2020-11-25 DIAGNOSIS — I428 Other cardiomyopathies: Secondary | ICD-10-CM | POA: Diagnosis not present

## 2020-11-25 DIAGNOSIS — I10 Essential (primary) hypertension: Secondary | ICD-10-CM | POA: Diagnosis not present

## 2020-11-25 DIAGNOSIS — Z89422 Acquired absence of other left toe(s): Secondary | ICD-10-CM

## 2020-11-25 DIAGNOSIS — E1142 Type 2 diabetes mellitus with diabetic polyneuropathy: Secondary | ICD-10-CM | POA: Diagnosis present

## 2020-11-25 DIAGNOSIS — L89899 Pressure ulcer of other site, unspecified stage: Secondary | ICD-10-CM | POA: Diagnosis not present

## 2020-11-25 DIAGNOSIS — M79672 Pain in left foot: Secondary | ICD-10-CM | POA: Diagnosis present

## 2020-11-25 DIAGNOSIS — L97521 Non-pressure chronic ulcer of other part of left foot limited to breakdown of skin: Secondary | ICD-10-CM | POA: Diagnosis not present

## 2020-11-25 LAB — CBC WITH DIFFERENTIAL/PLATELET
Abs Immature Granulocytes: 0.04 10*3/uL (ref 0.00–0.07)
Basophils Absolute: 0 10*3/uL (ref 0.0–0.1)
Basophils Relative: 0 %
Eosinophils Absolute: 0 10*3/uL (ref 0.0–0.5)
Eosinophils Relative: 0 %
HCT: 39 % (ref 36.0–46.0)
Hemoglobin: 12.4 g/dL (ref 12.0–15.0)
Immature Granulocytes: 0 %
Lymphocytes Relative: 16 %
Lymphs Abs: 1.5 10*3/uL (ref 0.7–4.0)
MCH: 31.1 pg (ref 26.0–34.0)
MCHC: 31.8 g/dL (ref 30.0–36.0)
MCV: 97.7 fL (ref 80.0–100.0)
Monocytes Absolute: 0.7 10*3/uL (ref 0.1–1.0)
Monocytes Relative: 7 %
Neutro Abs: 6.9 10*3/uL (ref 1.7–7.7)
Neutrophils Relative %: 77 %
Platelets: 138 10*3/uL — ABNORMAL LOW (ref 150–400)
RBC: 3.99 MIL/uL (ref 3.87–5.11)
RDW: 13.2 % (ref 11.5–15.5)
WBC: 9.1 10*3/uL (ref 4.0–10.5)
nRBC: 0 % (ref 0.0–0.2)

## 2020-11-25 LAB — COMPREHENSIVE METABOLIC PANEL
ALT: 24 U/L (ref 0–44)
AST: 22 U/L (ref 15–41)
Albumin: 3.4 g/dL — ABNORMAL LOW (ref 3.5–5.0)
Alkaline Phosphatase: 70 U/L (ref 38–126)
Anion gap: 14 (ref 5–15)
BUN: 60 mg/dL — ABNORMAL HIGH (ref 8–23)
CO2: 26 mmol/L (ref 22–32)
Calcium: 8.8 mg/dL — ABNORMAL LOW (ref 8.9–10.3)
Chloride: 98 mmol/L (ref 98–111)
Creatinine, Ser: 2.12 mg/dL — ABNORMAL HIGH (ref 0.44–1.00)
GFR, Estimated: 25 mL/min — ABNORMAL LOW (ref >60)
Glucose, Bld: 213 mg/dL — ABNORMAL HIGH (ref 70–99)
Potassium: 3.8 mmol/L (ref 3.5–5.1)
Sodium: 138 mmol/L (ref 135–145)
Total Bilirubin: 0.8 mg/dL (ref 0.3–1.2)
Total Protein: 7 g/dL (ref 6.5–8.1)

## 2020-11-25 LAB — MRSA PCR SCREENING: MRSA by PCR: NEGATIVE

## 2020-11-25 LAB — D-DIMER, QUANTITATIVE: D-Dimer, Quant: 1.54 ug/mL-FEU — ABNORMAL HIGH (ref 0.00–0.50)

## 2020-11-25 LAB — GLUCOSE, CAPILLARY
Glucose-Capillary: 168 mg/dL — ABNORMAL HIGH (ref 70–99)
Glucose-Capillary: 308 mg/dL — ABNORMAL HIGH (ref 70–99)
Glucose-Capillary: 214 mg/dL — ABNORMAL HIGH (ref 70–99)
Glucose-Capillary: 254 mg/dL — ABNORMAL HIGH (ref 70–99)

## 2020-11-25 LAB — FIBRINOGEN: Fibrinogen: 630 mg/dL — ABNORMAL HIGH (ref 210–475)

## 2020-11-25 LAB — C-REACTIVE PROTEIN
CRP: 2.7 mg/dL — ABNORMAL HIGH (ref <1.0)
CRP: 7.6 mg/dL — ABNORMAL HIGH (ref <1.0)

## 2020-11-25 LAB — HEMOGLOBIN A1C
Hgb A1c MFr Bld: 8.1 % — ABNORMAL HIGH (ref 4.8–5.6)
Mean Plasma Glucose: 185.77 mg/dL

## 2020-11-25 LAB — PROCALCITONIN: Procalcitonin: 0.17 ng/mL

## 2020-11-25 LAB — MAGNESIUM: Magnesium: 2.3 mg/dL (ref 1.7–2.4)

## 2020-11-25 LAB — FERRITIN: Ferritin: 283 ng/mL (ref 11–307)

## 2020-11-25 LAB — HIV ANTIBODY (ROUTINE TESTING W REFLEX): HIV Screen 4th Generation wRfx: NONREACTIVE

## 2020-11-25 LAB — TROPONIN I (HIGH SENSITIVITY): Troponin I (High Sensitivity): 17 ng/L (ref <18)

## 2020-11-25 LAB — LACTATE DEHYDROGENASE: LDH: 172 U/L (ref 98–192)

## 2020-11-25 LAB — BRAIN NATRIURETIC PEPTIDE: B Natriuretic Peptide: 304.7 pg/mL — ABNORMAL HIGH (ref 0.0–100.0)

## 2020-11-25 MED ORDER — GABAPENTIN 600 MG PO TABS
600.0000 mg | ORAL_TABLET | Freq: Every day | ORAL | Status: DC
  Administered 2020-11-25 – 2020-11-30 (×6): 600 mg via ORAL
  Filled 2020-11-25 (×6): qty 1

## 2020-11-25 MED ORDER — ENOXAPARIN SODIUM 60 MG/0.6ML ~~LOC~~ SOLN
60.0000 mg | SUBCUTANEOUS | Status: DC
  Administered 2020-11-25 – 2020-11-27 (×3): 60 mg via SUBCUTANEOUS
  Administered 2020-11-28: 30 mg via SUBCUTANEOUS
  Administered 2020-11-29: 60 mg via SUBCUTANEOUS
  Filled 2020-11-25 (×5): qty 0.6

## 2020-11-25 MED ORDER — EMPAGLIFLOZIN 10 MG PO TABS
10.0000 mg | ORAL_TABLET | Freq: Every day | ORAL | Status: DC
Start: 2020-11-25 — End: 2020-11-30
  Administered 2020-11-25 – 2020-11-30 (×6): 10 mg via ORAL
  Filled 2020-11-25 (×7): qty 1

## 2020-11-25 MED ORDER — FERROUS SULFATE 325 (65 FE) MG PO TABS
325.0000 mg | ORAL_TABLET | Freq: Two times a day (BID) | ORAL | Status: DC
  Administered 2020-11-25 – 2020-11-30 (×10): 325 mg via ORAL
  Filled 2020-11-25 (×10): qty 1

## 2020-11-25 MED ORDER — INSULIN GLARGINE 100 UNIT/ML ~~LOC~~ SOLN
22.0000 [IU] | Freq: Every day | SUBCUTANEOUS | Status: DC
  Administered 2020-11-25: 22 [IU] via SUBCUTANEOUS
  Filled 2020-11-25 (×2): qty 0.22

## 2020-11-25 MED ORDER — CHLORHEXIDINE GLUCONATE 0.12 % MT SOLN
15.0000 mL | Freq: Two times a day (BID) | OROMUCOSAL | Status: DC
  Administered 2020-11-25 – 2020-11-30 (×9): 15 mL via OROMUCOSAL
  Filled 2020-11-25 (×8): qty 15

## 2020-11-25 MED ORDER — CLOPIDOGREL BISULFATE 75 MG PO TABS
75.0000 mg | ORAL_TABLET | Freq: Every day | ORAL | Status: DC
  Administered 2020-11-25 – 2020-11-30 (×6): 75 mg via ORAL
  Filled 2020-11-25 (×6): qty 1

## 2020-11-25 MED ORDER — SODIUM CHLORIDE 0.9 % IV SOLN
100.0000 mg | Freq: Every day | INTRAVENOUS | Status: AC
  Administered 2020-11-26 – 2020-11-29 (×4): 100 mg via INTRAVENOUS
  Filled 2020-11-25 (×4): qty 20

## 2020-11-25 MED ORDER — TORSEMIDE 20 MG PO TABS: 40.0000 mg | ORAL_TABLET | Freq: Every day | ORAL | Status: DC

## 2020-11-25 MED ORDER — ADULT MULTIVITAMIN W/MINERALS CH
1.0000 | ORAL_TABLET | Freq: Every day | ORAL | Status: DC
  Administered 2020-11-25 – 2020-11-30 (×6): 1 via ORAL
  Filled 2020-11-25 (×6): qty 1

## 2020-11-25 MED ORDER — ROSUVASTATIN CALCIUM 5 MG PO TABS
10.0000 mg | ORAL_TABLET | Freq: Every day | ORAL | Status: DC
  Administered 2020-11-25 – 2020-11-30 (×6): 10 mg via ORAL
  Filled 2020-11-25 (×6): qty 2

## 2020-11-25 MED ORDER — ORAL CARE MOUTH RINSE
15.0000 mL | Freq: Two times a day (BID) | OROMUCOSAL | Status: DC
  Administered 2020-11-25 – 2020-11-30 (×8): 15 mL via OROMUCOSAL

## 2020-11-25 MED ORDER — LEVOTHYROXINE SODIUM 50 MCG PO TABS
50.0000 ug | ORAL_TABLET | Freq: Every day | ORAL | Status: DC
  Administered 2020-11-25 – 2020-11-30 (×6): 50 ug via ORAL
  Filled 2020-11-25 (×6): qty 1

## 2020-11-25 MED ORDER — INSULIN ASPART 100 UNIT/ML ~~LOC~~ SOLN
0.0000 [IU] | Freq: Three times a day (TID) | SUBCUTANEOUS | Status: DC
  Administered 2020-11-25: 11 [IU] via SUBCUTANEOUS
  Administered 2020-11-25: 5 [IU] via SUBCUTANEOUS
  Administered 2020-11-26: 15 [IU] via SUBCUTANEOUS

## 2020-11-25 MED ORDER — SODIUM CHLORIDE 0.9 % IV SOLN
200.0000 mg | Freq: Once | INTRAVENOUS | Status: AC
  Administered 2020-11-25: 200 mg via INTRAVENOUS
  Filled 2020-11-25 (×2): qty 40

## 2020-11-25 MED ORDER — TORSEMIDE 20 MG PO TABS: 80.0000 mg | ORAL_TABLET | Freq: Every day | ORAL | Status: DC

## 2020-11-25 MED ORDER — OMEGA-3-ACID ETHYL ESTERS 1 G PO CAPS
1.0000 g | ORAL_CAPSULE | Freq: Every day | ORAL | Status: DC
  Administered 2020-11-25 – 2020-11-30 (×6): 1 g via ORAL
  Filled 2020-11-25 (×6): qty 1

## 2020-11-25 MED ORDER — TRAMADOL HCL 50 MG PO TABS
50.0000 mg | ORAL_TABLET | Freq: Four times a day (QID) | ORAL | Status: DC | PRN
  Administered 2020-11-25: 50 mg via ORAL
  Filled 2020-11-25: qty 1

## 2020-11-25 MED ORDER — TORSEMIDE 20 MG PO TABS
20.0000 mg | ORAL_TABLET | Freq: Every day | ORAL | Status: DC
  Administered 2020-11-25 – 2020-11-26 (×2): 20 mg via ORAL
  Filled 2020-11-25 (×2): qty 1

## 2020-11-25 MED ORDER — ENOXAPARIN SODIUM 40 MG/0.4ML ~~LOC~~ SOLN: 40.0000 mg | SUBCUTANEOUS | Status: DC

## 2020-11-25 MED ORDER — LOSARTAN POTASSIUM 25 MG PO TABS
12.5000 mg | ORAL_TABLET | Freq: Every day | ORAL | Status: DC
  Administered 2020-11-25 – 2020-11-26 (×2): 12.5 mg via ORAL
  Filled 2020-11-25 (×2): qty 0.5

## 2020-11-25 MED ORDER — VITAMIN D 25 MCG (1000 UNIT) PO TABS
1000.0000 [IU] | ORAL_TABLET | Freq: Every day | ORAL | Status: DC
  Administered 2020-11-25 – 2020-11-30 (×6): 1000 [IU] via ORAL
  Filled 2020-11-25 (×6): qty 1

## 2020-11-25 MED ORDER — CARVEDILOL 3.125 MG PO TABS
3.1250 mg | ORAL_TABLET | Freq: Every day | ORAL | Status: DC
  Administered 2020-11-25 – 2020-11-30 (×6): 3.125 mg via ORAL
  Filled 2020-11-25 (×6): qty 1

## 2020-11-25 MED ORDER — ZINC SULFATE 220 (50 ZN) MG PO CAPS
220.0000 mg | ORAL_CAPSULE | Freq: Every day | ORAL | Status: DC
  Administered 2020-11-25 – 2020-11-30 (×6): 220 mg via ORAL
  Filled 2020-11-25 (×6): qty 1

## 2020-11-25 MED ORDER — GUAIFENESIN-DM 100-10 MG/5ML PO SYRP: 10.0000 mL | ORAL_SOLUTION | ORAL | Status: DC | PRN

## 2020-11-25 MED ORDER — GABAPENTIN 600 MG PO TABS
1200.0000 mg | ORAL_TABLET | Freq: Every day | ORAL | Status: DC
  Administered 2020-11-25 – 2020-11-29 (×5): 1200 mg via ORAL
  Filled 2020-11-25 (×5): qty 2

## 2020-11-25 MED ORDER — AMIODARONE HCL 200 MG PO TABS
200.0000 mg | ORAL_TABLET | Freq: Every day | ORAL | Status: DC
  Administered 2020-11-25 – 2020-11-30 (×6): 200 mg via ORAL
  Filled 2020-11-25 (×6): qty 1

## 2020-11-25 MED ORDER — CALCIUM CARBONATE-VITAMIN D 500-200 MG-UNIT PO TABS
1.0000 | ORAL_TABLET | Freq: Two times a day (BID) | ORAL | Status: DC
  Administered 2020-11-25 – 2020-11-30 (×10): 1 via ORAL
  Filled 2020-11-25 (×10): qty 1

## 2020-11-25 MED ORDER — PIPERACILLIN-TAZOBACTAM 3.375 G IVPB
3.3750 g | Freq: Three times a day (TID) | INTRAVENOUS | Status: DC
  Administered 2020-11-25 – 2020-11-30 (×14): 3.375 g via INTRAVENOUS
  Filled 2020-11-25 (×17): qty 50

## 2020-11-25 MED ORDER — PIPERACILLIN-TAZOBACTAM 3.375 G IVPB 30 MIN
3.3750 g | Freq: Once | INTRAVENOUS | Status: AC
  Administered 2020-11-25: 3.375 g via INTRAVENOUS
  Filled 2020-11-25: qty 50

## 2020-11-25 MED ORDER — MORPHINE SULFATE (PF) 2 MG/ML IV SOLN: 2.0000 mg | INTRAVENOUS | Status: DC | PRN

## 2020-11-25 MED ORDER — HYDROCOD POLST-CPM POLST ER 10-8 MG/5ML PO SUER: 5.0000 mL | Freq: Two times a day (BID) | ORAL | Status: DC | PRN

## 2020-11-25 MED ORDER — NITROGLYCERIN 0.4 MG SL SUBL: 0.4000 mg | SUBLINGUAL_TABLET | SUBLINGUAL | Status: DC | PRN

## 2020-11-25 MED ORDER — SODIUM CHLORIDE 0.9 % IV SOLN: INTRAVENOUS | Status: DC

## 2020-11-25 MED ORDER — ONDANSETRON HCL 4 MG PO TABS
4.0000 mg | ORAL_TABLET | Freq: Four times a day (QID) | ORAL | Status: DC | PRN
Start: 2020-11-25 — End: 2020-11-27

## 2020-11-25 MED ORDER — DULOXETINE HCL 30 MG PO CPEP
30.0000 mg | ORAL_CAPSULE | Freq: Every day | ORAL | Status: DC
  Administered 2020-11-25 – 2020-11-29 (×5): 30 mg via ORAL
  Filled 2020-11-25 (×5): qty 1

## 2020-11-25 MED ORDER — ALBUTEROL SULFATE HFA 108 (90 BASE) MCG/ACT IN AERS
2.0000 | INHALATION_SPRAY | Freq: Four times a day (QID) | RESPIRATORY_TRACT | Status: DC | PRN
  Filled 2020-11-25: qty 6.7

## 2020-11-25 MED ORDER — ACETAMINOPHEN 325 MG PO TABS: 650.0000 mg | ORAL_TABLET | Freq: Four times a day (QID) | ORAL | Status: DC | PRN

## 2020-11-25 MED ORDER — INSULIN ASPART 100 UNIT/ML ~~LOC~~ SOLN
0.0000 [IU] | Freq: Every day | SUBCUTANEOUS | Status: DC
  Administered 2020-11-25: 3 [IU] via SUBCUTANEOUS

## 2020-11-25 MED ORDER — ISOSORBIDE MONONITRATE ER 30 MG PO TB24
30.0000 mg | ORAL_TABLET | Freq: Every day | ORAL | Status: DC
  Administered 2020-11-25 – 2020-11-30 (×6): 30 mg via ORAL
  Filled 2020-11-25 (×6): qty 1

## 2020-11-25 MED ORDER — VANCOMYCIN HCL 1500 MG/300ML IV SOLN
1500.0000 mg | INTRAVENOUS | Status: DC
  Administered 2020-11-27 – 2020-11-28 (×2): 1500 mg via INTRAVENOUS
  Filled 2020-11-25 (×3): qty 300

## 2020-11-25 MED ORDER — TRAZODONE HCL 50 MG PO TABS: 25.0000 mg | ORAL_TABLET | Freq: Every evening | ORAL | Status: DC | PRN

## 2020-11-25 MED ORDER — ASCORBIC ACID 500 MG PO TABS
500.0000 mg | ORAL_TABLET | Freq: Every day | ORAL | Status: DC
  Administered 2020-11-25 – 2020-11-30 (×6): 500 mg via ORAL
  Filled 2020-11-25 (×6): qty 1

## 2020-11-25 MED ORDER — VANCOMYCIN HCL 1000 MG/200ML IV SOLN: 1000.0000 mg | Freq: Once | INTRAVENOUS | Status: DC

## 2020-11-25 MED ORDER — ONDANSETRON HCL 4 MG/2ML IJ SOLN: 4.0000 mg | Freq: Four times a day (QID) | INTRAMUSCULAR | Status: DC | PRN

## 2020-11-25 MED ORDER — MAGNESIUM HYDROXIDE 400 MG/5ML PO SUSP
30.0000 mL | Freq: Every day | ORAL | Status: DC | PRN
Start: 2020-11-25 — End: 2020-11-30

## 2020-11-25 MED ORDER — ACETAMINOPHEN 650 MG RE SUPP: 650.0000 mg | Freq: Four times a day (QID) | RECTAL | Status: DC | PRN

## 2020-11-25 NOTE — ED Notes (Signed)
Report given to Altha Harm RN of [place]

## 2020-11-25 NOTE — Progress Notes (Signed)
PROGRESS NOTE                                                                                                                                                                                                             Patient Demographics:    [name], is a 65 y.o. female, DOB - 1956/04/28, JOA:[id number]  Outpatient Primary MD for the patient is [name], MD    LOS - 0  Admit date - 11/25/2020    No chief complaint on file.      Brief Narrative (HPI from H&P) - 65 y.o. Caucasian female with medical history significant for coronary artery disease, hypertension, obstructive sleep apnea, type 2 diabetes mellitus, systolic CHF status post AICD due to episodes of V. tach in the past and previous history of osteomyelitis of the ankle and foot and depression, who presented to the emergency room with acute onset of worsening left foot swelling with erythema for the last month with left foot wound which has been followed by podiatry, on initial x-rays there was evidence of some subcutaneous air in the left foot and she was transferred from [facility] to [facility] for AICD compatible MRI.   Subjective:    [name] today has, No headache, No chest pain, No abdominal pain - No Nausea, No new weakness tingling or numbness, no SOB.   Assessment  & Plan :     1.  Left big toe and foot cellulitis acute on chronic.  Being followed outpatient with podiatry, MRI will be tried if AICD is compatible, Dr. Amalia Hailey to follow, continue empiric antibiotic, currently not septic.  2.  CAD with history of chronic diastolic CHF.  Last echo few months ago shows EF of 55%, has AICD. She follows with Dr. Caryl Comes for her cardiology issues, currently symptom-free, EKG is paced, no chest symptoms currently, continue combination of amiodarone, Coreg, Plavix, Imdur, ARB, statin for secondary prevention.  Note home dose diuretics have been reduced will  monitor and adjust as needed.  Note patient is on amiodarone and has AICD for history of V. tach in the past.  Note does not have confirmed paroxysmal A. fib in the chart.  3. Incidental COVID-19 infection and fully vaccinated and boosted patient.  Supportive care. Encouraged the patient to sit up in chair in the daytime use I-S and  flutter valve for pulmonary toiletry.   4.  Morbid obesity BMI 40.  Follow with PCP for weight loss.  5.  OSA.  CPAP nightly.  6.  Dyslipidemia.  On statin.  7.  CKD4.  Baseline creatinine around 2.2.  Monitor.  8.  DM type II.  On Lantus plus sliding scale for now.  Lab Results  Component Value Date   HGBA1C 8.1 (H) 11/25/2020    CBG (last 3)  Recent Labs    11/25/20 0759  GLUCAP 168*     SpO2: 97 %  Recent Labs  Lab 11/24/20 1752 11/24/20 2007 11/24/20 2040 11/25/20 0554  WBC 12.1*  --   --  9.1  HGB 12.5  --   --  12.4  HCT 38.7  --   --  39.0  PLT 158  --   --  138*  CRP  --  2.7*  --  7.6*  BNP  --   --   --  304.7*  DDIMER  --   --   --  1.54*  PROCALCITON  --   --   --  0.17  AST 30  --   --  22  ALT 28  --   --  24  ALKPHOS 88  --   --  70  BILITOT 0.7  --   --  0.8  ALBUMIN 4.1  --   --  3.4*  SARSCOV2NAA  --   --  POSITIVE*  --          Condition - Extremely Guarded  Family Communication  : Daughter [name] [phone] on 11/25/2020 message left at 9:33 AM.  Code Status :  Full  Consults  :  Podiatry Dr Amalia Hailey  PUD Prophylaxis :    Procedures  :     MRI -      Disposition Plan  :    Status is: Inpatient  Remains inpatient appropriate because:IV treatments appropriate due to intensity of illness or inability to take PO   Dispo: The patient is from: Home              Anticipated d/c is to: Home              Anticipated d/c date is: > 3 days              Patient currently is not medically stable to d/c.   Difficult to place patient No  DVT Prophylaxis  :  Lovenox    Lab Results  Component Value  Date   PLT 138 (L) 11/25/2020    Diet :  Diet Order            Diet heart healthy/carb modified Room service appropriate? Yes; Fluid consistency: Thin  Diet effective now                  Inpatient Medications  Scheduled Meds: . amiodarone  200 mg Oral Daily  . vitamin C  500 mg Oral Daily  . calcium-vitamin D  1 tablet Oral BID WC  . carvedilol  3.125 mg Oral Q breakfast  . chlorhexidine  15 mL Mouth Rinse BID  . cholecalciferol  1,000 Units Oral Daily  . clopidogrel  75 mg Oral Daily  . DULoxetine  30 mg Oral QHS  . empagliflozin  10 mg Oral QAC breakfast  . enoxaparin (LOVENOX) injection  40 mg Subcutaneous Q24H  . ferrous sulfate  325 mg Oral BID WC  . gabapentin  1,200 mg Oral QHS  . gabapentin  600 mg Oral Daily  . insulin aspart  0-15 Units Subcutaneous TID WC  . insulin aspart  0-5 Units Subcutaneous QHS  . insulin glargine  22 Units Subcutaneous Q2200  . isosorbide mononitrate  30 mg Oral Daily  . levothyroxine  50 mcg Oral Q0600  . losartan  12.5 mg Oral Daily  . mouth rinse  15 mL Mouth Rinse q12n4p  . multivitamin with minerals  1 tablet Oral Daily  . omega-3 acid ethyl esters  1 g Oral Daily  . rosuvastatin  10 mg Oral Daily  . torsemide  20 mg Oral q1800  . zinc sulfate  220 mg Oral Daily   Continuous Infusions: . piperacillin-tazobactam (ZOSYN)  IV    . remdesivir 200 mg in sodium chloride 0.9% 250 mL IVPB 200 mg (11/25/20 0850)   Followed by  . [START ON 11/26/2020] remdesivir 100 mg in NS 100 mL    . [START ON 11/27/2020] vancomycin     PRN Meds:.acetaminophen **OR** [DISCONTINUED] acetaminophen, albuterol, chlorpheniramine-HYDROcodone, guaiFENesin-dextromethorphan, magnesium hydroxide, morphine injection, nitroGLYCERIN, ondansetron **OR** ondansetron (ZOFRAN) IV, traMADol, traZODone  Antibiotics  :    Anti-infectives (From admission, onward)   Start     Dose/Rate Route Frequency Ordered Stop   11/27/20 0000  vancomycin (VANCOREADY) IVPB 1500  mg/300 mL        1,500 mg 150 mL/hr over 120 Minutes Intravenous Every 48 hours 11/25/20 0607     11/26/20 1000  remdesivir 100 mg in sodium chloride 0.9 % 100 mL IVPB       "Followed by" Linked Group Details   100 mg 200 mL/hr over 30 Minutes Intravenous Daily 11/25/20 0550 11/30/20 0959   11/25/20 1200  piperacillin-tazobactam (ZOSYN) IVPB 3.375 g        3.375 g 12.5 mL/hr over 240 Minutes Intravenous Every 8 hours 11/25/20 0555     11/25/20 0800  remdesivir 200 mg in sodium chloride 0.9% 250 mL IVPB       "Followed by" Linked Group Details   200 mg 580 mL/hr over 30 Minutes Intravenous Once 11/25/20 0550     11/25/20 0615  vancomycin (VANCOREADY) IVPB 1000 mg/200 mL  Status:  Discontinued        1,000 mg 200 mL/hr over 60 Minutes Intravenous  Once 11/25/20 0520 11/25/20 0530   11/25/20 0600  piperacillin-tazobactam (ZOSYN) IVPB 3.375 g        3.375 g 100 mL/hr over 30 Minutes Intravenous  Once 11/25/20 0520 11/25/20 0855       Time Spent in minutes  30   Lala Lund M.D on 11/25/2020 at 9:15 AM  To page go to [email or web address]   [facility] -  Office  [phone]     See all Orders from today for further details    Objective:   Vitals:   11/25/20 0435 11/25/20 0756  BP: (!) 127/53 (!) 117/48  Pulse:  63  Resp: 15 15  Temp: 98.3 F (36.8 C) 98.3 F (36.8 C)  TempSrc: Oral Oral  SpO2: 95% 97%  Weight: 121.8 kg   Height: 5\' 8"  (1.727 m)     Wt Readings from Last 3 Encounters:  11/25/20 121.8 kg  11/24/20 117.9 kg  10/16/20 120.7 kg     Intake/Output Summary (Last 24 hours) at 11/25/2020 0915 Last data filed at 11/25/2020 4315 Gross per 24 hour  Intake 200 ml  Output --  Net 200 ml     Physical  Exam  Awake Alert, No new F.N deficits, Normal affect [place].AT,PERRAL Supple Neck,No JVD, No cervical lymphadenopathy appriciated.  Symmetrical Chest wall movement, Good air movement bilaterally, CTAB RRR,No Gallops,Rubs or new Murmurs, No Parasternal  Heave +ve B.Sounds, Abd Soft, No tenderness, No organomegaly appriciated, No rebound - guarding or rigidity. L Big toe and forefoot cellulitis, amputated left and right second toe    Data Review:    CBC Recent Labs  Lab 11/24/20 1752 11/25/20 0554  WBC 12.1* 9.1  HGB 12.5 12.4  HCT 38.7 39.0  PLT 158 138*  MCV 95.8 97.7  MCH 30.9 31.1  MCHC 32.3 31.8  RDW 13.2 13.2  LYMPHSABS 1.4 1.5  MONOABS 0.9 0.7  EOSABS 0.0 0.0  BASOSABS 0.0 0.0    Recent Labs  Lab 11/24/20 1752 11/24/20 2007 11/25/20 0554  NA 134*  --  138  K 4.0  --  3.8  CL 93*  --  98  CO2 29  --  26  GLUCOSE 262*  --  213*  BUN 65*  --  60*  CREATININE 2.20*  --  2.12*  CALCIUM 9.0  --  8.8*  AST 30  --  22  ALT 28  --  24  ALKPHOS 88  --  70  BILITOT 0.7  --  0.8  ALBUMIN 4.1  --  3.4*  CRP  --  2.7* 7.6*  DDIMER  --   --  1.54*  PROCALCITON  --   --  0.17  HGBA1C  --   --  8.1*  BNP  --   --  304.7*    ------------------------------------------------------------------------------------------------------------------ No results for input(s): CHOL, HDL, LDLCALC, TRIG, CHOLHDL, LDLDIRECT in the last 72 hours.  Lab Results  Component Value Date   HGBA1C 8.1 (H) 11/25/2020   ------------------------------------------------------------------------------------------------------------------ No results for input(s): TSH, T4TOTAL, T3FREE, THYROIDAB in the last 72 hours.  Invalid input(s): FREET3  Cardiac Enzymes No results for input(s): CKMB, TROPONINI, MYOGLOBIN in the last 168 hours.  Invalid input(s): CK ------------------------------------------------------------------------------------------------------------------    Component Value Date/Time   BNP 304.7 (H) 11/25/2020 3790    Micro Results Recent Results (from the past 240 hour(s))  Aerobic Culture w Gram Stain (superficial specimen)     Status: None (Preliminary result)   Collection Time: 11/24/20  8:01 PM   Specimen: Foot; Wound   Result Value Ref Range Status   Specimen Description   Final    FOOT Performed at [facility], [address] [place], [place] [address]    Special Requests   Final    Normal Performed at [facility], [place], [place] [address]    Gram Stain   Final    NO WBC SEEN ABUNDANT GRAM POSITIVE COCCI RARE GRAM NEGATIVE RODS Performed at [facility], [place] [address], [place], [place] [address]    Culture PENDING  Incomplete   Report Status PENDING  Incomplete  Blood culture (routine x 2)     Status: None (Preliminary result)   Collection Time: 11/24/20  8:07 PM   Specimen: BLOOD  Result Value Ref Range Status   Specimen Description BLOOD RIGHT ANTECUBITAL  Final   Special Requests   Final    BOTTLES DRAWN AEROBIC AND ANAEROBIC Blood Culture results may not be optimal due to an inadequate volume of blood received in culture bottles   Culture   Final    NO GROWTH < 12 HOURS Performed at [facility], [place]  [address] [place], [place] [address]    Report Status PENDING  Incomplete  Blood culture (routine x 2)     Status: None (Preliminary result)   Collection Time: 11/24/20  8:07 PM   Specimen: BLOOD  Result Value Ref Range Status   Specimen Description BLOOD LEFT ANTECUBITAL  Final   Special Requests   Final    BOTTLES DRAWN AEROBIC AND ANAEROBIC Blood Culture results may not be optimal due to an inadequate volume of blood received in culture bottles   Culture   Final    NO GROWTH < 12 HOURS Performed at [facility], [address], [place], [place] [address]    Report Status PENDING  Incomplete  Resp Panel by RT-PCR (Flu A&B, Covid) Nasopharyngeal Swab     Status: Abnormal   Collection Time: 11/24/20  8:40 PM   Specimen: Nasopharyngeal Swab; Nasopharyngeal(NP) swabs in vial transport medium  Result Value Ref Range Status   SARS Coronavirus 2 by RT PCR POSITIVE (A) NEGATIVE Final    Comment: RESULT CALLED TO, READ  BACK BY AND VERIFIED WITH: [place] RN 11/24/2020 2144 JG (NOTE) SARS-CoV-2 target nucleic acids are DETECTED.  The SARS-CoV-2 RNA is generally detectable in upper respiratory specimens during the acute phase of infection. Positive results are indicative of the presence of the identified virus, but do not rule out bacterial infection or co-infection with other pathogens not detected by the test. Clinical correlation with patient history and other diagnostic information is necessary to determine patient infection status. The expected result is Negative.  Fact Sheet for Patients: EntrepreneurPulse.com.au  Fact Sheet for Healthcare Providers: IncredibleEmployment.be  This test is not yet approved or cleared by the [place] FDA and  has been authorized for detection and/or diagnosis of SARS-CoV-2 by FDA under an Emergency Use Authorization (EUA).  This EUA will remain in effect (meaning this test can  be used) for the duration of  the COVID-19 declaration under Section 564(b)(1) of the Act, 21 U.S.C. section 360bbb-3(b)(1), unless the authorization is terminated or revoked sooner.     Influenza A by PCR NEGATIVE NEGATIVE Final   Influenza B by PCR NEGATIVE NEGATIVE Final    Comment: (NOTE) The Xpert Xpress SARS-CoV-2/FLU/RSV plus assay is intended as an aid in the diagnosis of influenza from Nasopharyngeal swab specimens and should not be used as a sole basis for treatment. Nasal washings and aspirates are unacceptable for Xpert Xpress SARS-CoV-2/FLU/RSV testing.  Fact Sheet for Patients: EntrepreneurPulse.com.au  Fact Sheet for Healthcare Providers: IncredibleEmployment.be  This test is not yet approved or cleared by the [place] FDA and has been authorized for detection and/or diagnosis of SARS-CoV-2 by FDA under an Emergency Use Authorization (EUA). This EUA will remain in effect (meaning  this test can be used) for the duration of the COVID-19 declaration under Section 564(b)(1) of the Act, 21 U.S.C. section 360bbb-3(b)(1), unless the authorization is terminated or revoked.  Performed at [facility], [address] [place], [place] [address]   MRSA PCR Screening     Status: None   Collection Time: 11/25/20  5:39 AM   Specimen: Nasal Mucosa; Nasopharyngeal  Result Value Ref Range Status   MRSA by PCR NEGATIVE NEGATIVE Final    Comment:        The GeneXpert MRSA Assay (FDA approved for NASAL specimens only), is one component of a comprehensive MRSA colonization surveillance program. It is not intended to diagnose MRSA infection nor to guide or monitor treatment for  MRSA infections. Performed at [facility], [place] [address] [place], [place] [address]     Radiology Reports MM 3D SCREEN BREAST BILATERAL  Result Date: 11/09/2020 CLINICAL DATA:  Screening. EXAM: DIGITAL SCREENING BILATERAL MAMMOGRAM WITH TOMO AND CAD COMPARISON:  Previous exam(s). ACR Breast Density Category b: There are scattered areas of fibroglandular density. FINDINGS: There are no findings suspicious for malignancy. The images were evaluated with computer-aided detection. IMPRESSION: No mammographic evidence of malignancy. A result letter of this screening mammogram will be mailed directly to the patient. RECOMMENDATION: Screening mammogram in one year. (Code:SM-B-01Y) BI-RADS CATEGORY  1: Negative. Electronically Signed   By: Dorise Bullion III M.D   On: 11/09/2020 13:22

## 2020-11-25 NOTE — Progress Notes (Signed)
Pharmacy Antibiotic Note  [name] is a 65 y.o. female admitted on 11/25/2020 with cellulitis.  Pharmacy has been consulted for vancomycin dosing.  Rec'd vanc 2500mg  at [facility].  Plan: Vancomycin 1500mg  IV Q48H. Goal AUC 400-550.  Expected AUC 480.  SCr used 2.2.  Also on Zosyn per MD; changed to extended-interval infusion.  Height: 5\' 8"  (172.7 cm) Weight: 121.8 kg (268 lb 8.3 oz) IBW/kg (Calculated) : 63.9  Temp (24hrs), Avg:98.6 F (37 C), Min:98.3 F (36.8 C), Max:99.2 F (37.3 C)  Recent Labs  Lab 11/24/20 1752  WBC 12.1*  CREATININE 2.20*    Estimated Creatinine Clearance: 34.6 mL/min (A) (by C-G formula based on SCr of 2.2 mg/dL (H)).    No Known Allergies   Thank you for allowing pharmacy to be a part of this patient's care.  Wynona Neat, PharmD, BCPS  11/25/2020 6:02 AM

## 2020-11-25 NOTE — ED Notes (Signed)
emtala checked for completion.

## 2020-11-25 NOTE — Consult Note (Signed)
[name] MRN: [id number] DOB: 08-Jul-1956  Consulting Physician: Lala Lund  CC: LT foot diabetic foot ulcer. Possible OM LT foot  Brief Narrative: 65 y.o.Caucasian femalewith medical history significant forcoronary artery disease, hypertension, obstructive sleep apnea, type 2 diabetes mellitus, systolic CHF status post AICD due to episodes of V. tach in the past and previous history of osteomyelitis of the ankle and foot and depression, who presented to the emergency room with acute onset of worsening left foot swelling with erythema for the last month with left foot wound which has been followed by podiatry, on initial x-rays there was evidence of some subcutaneous air in the left foot and she was transferred from [facility] to [facility] for AICD compatible MRI.  Podiatry consulted upon transfer from [facility] to [facility]. Patient seen this AM resting comfortably in her chair. She states that the wound has been present for several months now. She has been managed outpatient by podiatry at [facility].  Past Medical History:  Diagnosis Date  . Acute myocardial infarction, subendocardial infarction, initial episode of care ([place]) 07/21/2012  . Acute osteomyelitis involving ankle and foot ([place]) 02/06/2015  . Acute systolic heart failure ([place]) 07/21/2012   New onset 07/19/12; admission to [facility] ED. Elevated Troponins.  S/p 2D-echo with EF 20-25%.  S/p cardiac catheterization with stenting LAD.  Repeat 2D-echo 10/2011 with improved EF of 35%.   . Anemia   . Automatic implantable cardioverter-defibrillator in situ    a. MDT CRT-D 06/2014, SN: [id number]  .removed in feb 2020  . CAD (coronary artery disease)    a. cardiac cath 101/04/2012: PCI/DES to chronically occluded mLAD, consideration PCI to diag branch in 4 weeks.   . Cataract   . Chicken pox   . Chronic kidney disease   . Chronic systolic CHF (congestive heart failure) (HCC)    a. mixed ICM & NICM; b. EF 20-25% by echo  07/2012, mid-dist 2/3 of LV sev HK/AK, mild MR. echo 10/2012: EF 30-35%, sev HK ant-septal & inf walls, GR1DD, mild MR, PASP 33. c. echo 02/2013: EF 30%, GR1DD, mild MR. echo 04/2014: EF 30%, Septal-lat dyssynchrony, global HK, inf AK, GR1DD, mild MR. d. echo 10/2014: EF50-55%, WM nl, GR1DD, septal mild paradox. e. echo 02/2015: EF 50-55%, wm   . Depression   . Heart attack ([place])   . Heel ulcer ([place]) 04/27/2015  . History of blood transfusion ~ 2011   "plasma; had neuropathy; couldn't walk"  . Hypertension   . LBBB (left bundle branch block)   . Neuromuscular disorder ([place])   . Neuropathy 2011  . Obesity, unspecified   . OSA on CPAP    Moderate with AHI 23/hr and now on CPAP at 16cm H2O.  Her DME is [facility]  . Pure hypercholesterolemia   . Sepsis ([place]) 09/2018  . Type II diabetes mellitus ([place])   . Unspecified vitamin D deficiency    CBC Latest Ref Rng & Units 11/25/2020 11/24/2020 10/02/2020  WBC 4.0 - 10.5 K/uL 9.1 12.1(H) 6.4  Hemoglobin 12.0 - 15.0 g/dL 12.4 12.5 11.4(L)  Hematocrit 36.0 - 46.0 % 39.0 38.7 35.1(L)  Platelets 150 - 400 K/uL 138(L) 158 150   BMP Latest Ref Rng & Units 11/25/2020 11/24/2020 10/02/2020  Glucose 70 - 99 mg/dL 213(H) 262(H) 274(H)  BUN 8 - 23 mg/dL 60(H) 65(H) 68(H)  Creatinine 0.44 - 1.00 mg/dL 2.12(H) 2.20(H) 1.94(H)  BUN/Creat Ratio 12 - 28 - - -  Sodium 135 - 145 mmol/L  138 134(L) 135  Potassium 3.5 - 5.1 mmol/L 3.8 4.0 4.1  Chloride 98 - 111 mmol/L 98 93(L) 95(L)  CO2 22 - 32 mmol/L 26 29 28   Calcium 8.9 - 10.3 mg/dL 8.8(L) 9.0 8.7(L)        Physical Exam: General: The patient is alert and oriented x3 in no acute distress.  Dermatology: Ulcer noted to the plantar aspect of the first MTPJ approximately 1.0cm in diameter. Serous drainage. No significant malodor noted. Wound appears chronic and stable. There is localized erythema and edema around the first MTPJ and forefoot.  Vascular: Palpable pedal pulses. No known history of PVD  Neurological:  Epicritic and protective threshold diminished bilaterally.   Musculoskeletal Exam: H/o second toe amputation left. No other pedal deformities noted  Assessment/Plan of Care: 1. Chronic ulcer subfirst MTPJ left w/cellulitis. 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 After speaking with the nurse she states that MRI is scheduled for Monday, 11/27/2020. Podiatry will follow-up after MRI results are posted for more definitive plan of care -In the meantime continue wound care. Paint periwound with Betadine daily.  Thank you for the consult. Please contact me directly with any questions or concerns. Cell #[phone]   Edrick Kins, DPM [facility]  Dr. Edrick Kins, DPM    [address], [place] [address]                Office [phone]  Fax [phone]

## 2020-11-25 NOTE — H&P (Addendum)
PATIENT NAME: [name]    MR#:  [id number]  DATE OF BIRTH:  23-May-1956  DATE OF ADMISSION:  11/25/2020  PRIMARY CARE PHYSICIAN: [name], MD   Patient is coming from: Home REQUESTING/REFERRING PHYSICIAN: Duffy Bruce, MD [facility] ER)  CHIEF COMPLAINT:  Worsening left big toe and foot swelling, redness and pain  HISTORY OF PRESENT ILLNESS:  [name] is a 65 y.o. Caucasian female with medical history significant for coronary artery disease, hypertension, obstructive sleep apnea, type 2 diabetes mellitus, systolic CHF status post AICD and previous history of osteomyelitis of the ankle and foot and depression, who presented to the emergency room with acute onset of worsening left foot swelling with erythema for the last month with left foot wound which has been followed by podiatry.  It has been increasingly painful and red since yesterday with aching and throbbing pain with ambulation.  She was seen by podiatry and had an x-ray concerning for subcutaneous air and was therefore sent for admission and IV antibiotic therapy.  She was expected to have an MRI of the left foot however this could not be performed at [facility] as the patient has an AICD.  The patient denies any loss of taste or smell.  No cough or wheezing or dyspnea.  No nausea or vomiting or diarrhea.  No generalized weakness or fatigue.  She had 2 vaccine injections and a booster for COVID-19.  She was therefore directly admitted to [facility] for further evaluation and management. ED Course: Vital signs were within normal upon arrival to the ER except for temperature of 99.2.  Labs revealed mild hyponatremia hypochloremia, hyperglycemia of 262, BUN of 65 and creatinine of 2.2 compared to 68 and 1.94 in December of last year, anion gap of 12, CRP of 2.7 and CBC showed leukocytosis of 12.1 with sed rate of 60.  Blood cultures were drawn.  The patient had a positive COVID-19 PCR test in the  ER. EKG is currently pending. Imaging: As above.  The patient will be admitted to a medical bed for further evaluation and management. PAST MEDICAL HISTORY:   Past Medical History:  Diagnosis Date  . Acute myocardial infarction, subendocardial infarction, initial episode of care ([place]) 07/21/2012  . Acute osteomyelitis involving ankle and foot ([place]) 02/06/2015  . Acute systolic heart failure ([place]) 07/21/2012   New onset 07/19/12; admission to [facility] ED. Elevated Troponins.  S/p 2D-echo with EF 20-25%.  S/p cardiac catheterization with stenting LAD.  Repeat 2D-echo 10/2011 with improved EF of 35%.   . Anemia   . Automatic implantable cardioverter-defibrillator in situ    a. MDT CRT-D 06/2014, SN: [id number]  .removed in feb 2020  . CAD (coronary artery disease)    a. cardiac cath 101/04/2012: PCI/DES to chronically occluded mLAD, consideration PCI to diag branch in 4 weeks.   . Cataract   . Chicken pox   . Chronic kidney disease   . Chronic systolic CHF (congestive heart failure) (HCC)    a. mixed ICM & NICM; b. EF 20-25% by echo 07/2012, mid-dist 2/3 of LV sev HK/AK, mild MR. echo 10/2012: EF 30-35%, sev HK ant-septal & inf walls, GR1DD, mild MR, PASP 33. c. echo 02/2013: EF 30%, GR1DD, mild MR. echo 04/2014: EF 30%, Septal-lat dyssynchrony, global HK, inf AK, GR1DD, mild MR. d. echo 10/2014: EF50-55%, WM nl, GR1DD, septal mild paradox. e. echo 02/2015: EF 50-55%, wm   . Depression   .  Heart attack ([place])   . Heel ulcer ([place]) 04/27/2015  . History of blood transfusion ~ 2011   "plasma; had neuropathy; couldn't walk"  . Hypertension   . LBBB (left bundle branch block)   . Neuromuscular disorder ([place])   . Neuropathy 2011  . Obesity, unspecified   . OSA on CPAP    Moderate with AHI 23/hr and now on CPAP at 16cm H2O.  Her DME is [facility]  . Pure hypercholesterolemia   . Sepsis ([place]) 09/2018  . Type II diabetes mellitus ([place])   . Unspecified vitamin D deficiency     PAST SURGICAL HISTORY:    Past Surgical History:  Procedure Laterality Date  . AMPUTATION TOE Right 06/18/2015   Procedure: AMPUTATION TOE;  Surgeon: Samara Deist, DPM;  Location: [facility] ORS;  Service: Podiatry;  Laterality: Right;  . AMPUTATION TOE Left 10/08/2018   Procedure: AMPUTATION TOE LEFT 2ND;  Surgeon: Albertine Patricia, DPM;  Location: [facility] ORS;  Service: Podiatry;  Laterality: Left;  . BACK SURGERY    . BI-VENTRICULAR IMPLANTABLE CARDIOVERTER DEFIBRILLATOR N/A 07/06/2014   Procedure: BI-VENTRICULAR IMPLANTABLE CARDIOVERTER DEFIBRILLATOR  (CRT-D);  Surgeon: Deboraha Sprang, MD;  Location: [facility] CATH LAB;  Service: Cardiovascular;  Laterality: N/A;  . BI-VENTRICULAR IMPLANTABLE CARDIOVERTER DEFIBRILLATOR  (CRT-D)  07/06/2014  . BILATERAL OOPHORECTOMY  01/2011   ovarian cyst benign  . BIV ICD INSERTION CRT-D N/A 03/31/2019   Procedure: BIV ICD INSERTION CRT-D;  Surgeon: Constance Haw, MD;  Location: [place] CV LAB;  Service: Cardiovascular;  Laterality: N/A;  . COLONOSCOPY WITH PROPOFOL Left 02/22/2015   Procedure: COLONOSCOPY WITH PROPOFOL;  Surgeon: Hulen Luster, MD;  Location: [facility] ENDOSCOPY;  Service: Endoscopy;  Laterality: Left;  . CORONARY ANGIOPLASTY WITH STENT PLACEMENT Left 07/2012   new onset systolic CHF; elevated troponins.  Cardiac catheterization with stenting to LAD; EF 15%.  2D-echo: EF 20-25%.  . ESOPHAGOGASTRODUODENOSCOPY N/A 02/22/2015   Procedure: ESOPHAGOGASTRODUODENOSCOPY (EGD);  Surgeon: Hulen Luster, MD;  Location: [facility] ENDOSCOPY;  Service: Endoscopy;  Laterality: N/A;  . INCISION AND DRAINAGE ABSCESS Right 2007   groin; with ICU stay due to sepsis.  Marland Kitchen LAPAROSCOPIC CHOLECYSTECTOMY  2011  . LEFT HEART CATH AND CORONARY ANGIOGRAPHY N/A 10/05/2018   Procedure: LEFT HEART CATH AND CORONARY ANGIOGRAPHY;  Surgeon: Minna Merritts, MD;  Location: [place] CV LAB;  Service: Cardiovascular;  Laterality: N/A;  . LEFT HEART CATHETERIZATION WITH CORONARY ANGIOGRAM N/A 07/21/2012   Procedure:  LEFT HEART CATHETERIZATION WITH CORONARY ANGIOGRAM;  Surgeon: Jolaine Artist, MD;  Location: [facility] CATH LAB;  Service: Cardiovascular;  Laterality: N/A;  . [place]   L4-5  . PACEMAKER REMOVAL  11/2018   due to infection around pacemaker  . PERCUTANEOUS CORONARY STENT INTERVENTION (PCI-S) N/A 07/23/2012   Procedure: PERCUTANEOUS CORONARY STENT INTERVENTION (PCI-S);  Surgeon: Sherren Mocha, MD;  Location: [facility] CATH LAB;  Service: Cardiovascular;  Laterality: N/A;  . PERIPHERAL VASCULAR BALLOON ANGIOPLASTY Left 10/06/2018   Procedure: PERIPHERAL VASCULAR BALLOON ANGIOPLASTY;  Surgeon: Katha Cabal, MD;  Location: [place] CV LAB;  Service: Cardiovascular;  Laterality: Left;  . PERIPHERAL VASCULAR CATHETERIZATION N/A 02/10/2015   Procedure: Picc Line Insertion;  Surgeon: Katha Cabal, MD;  Location: [place] CV LAB;  Service: Cardiovascular;  Laterality: N/A;  . RIGHT HEART CATH N/A 03/29/2019   Procedure: RIGHT HEART CATH;  Surgeon: Jolaine Artist, MD;  Location: [place] CV LAB;  Service: Cardiovascular;  Laterality: N/A;  .  TEE WITHOUT CARDIOVERSION N/A 10/02/2018   Procedure: TRANSESOPHAGEAL ECHOCARDIOGRAM (TEE);  Surgeon: Wellington Hampshire, MD;  Location: [facility] ORS;  Service: Cardiovascular;  Laterality: N/A;  . [place]  . VAGINAL HYSTERECTOMY  01/2011   Fibroids/DUB.  Ovaries removed. [place].    SOCIAL HISTORY:   Social History   Tobacco Use  . Smoking status: Never Smoker  . Smokeless tobacco: Never Used  Substance Use Topics  . Alcohol use: No    Alcohol/week: 0.0 standard drinks    FAMILY HISTORY:   Family History  Problem Relation Age of Onset  . Diabetes Mother   . Hypertension Mother   . Arthritis Mother        knees, lumbar DDD, cervical DDD  . Cancer Father        prostate,skin,lymphoma.  . Cancer Brother 73       bladder cancer; non-smoker  . Diabetes Maternal Grandmother   . Heart disease Maternal  Grandmother   . COPD Maternal Grandmother   . Diabetes Paternal Grandmother   . Obesity Brother   . Diabetes Son   . Hypertension Son   . Cancer Maternal Grandfather   . Diabetes Paternal Grandfather     DRUG ALLERGIES:  No Known Allergies  REVIEW OF SYSTEMS:   ROS As per history of present illness. All pertinent systems were reviewed above. Constitutional, HEENT, cardiovascular, respiratory, GI, GU, musculoskeletal, neuro, psychiatric, endocrine, integumentary and hematologic systems were reviewed and are otherwise negative/unremarkable except for positive findings mentioned above in the HPI.   MEDICATIONS AT HOME:   Prior to Admission medications   Medication Sig Start Date End Date Taking? Authorizing Provider  amiodarone (PACERONE) 200 MG tablet Take 1 tablet (200 mg total) by mouth daily. 10/30/20   Bensimhon, Shaune Pascal, MD  Calcium Carb-Cholecalciferol (CALCIUM CARBONATE-VITAMIN D3) 600-400 MG-UNIT TABS Take 1 tablet by mouth 2 (two) times a day.    [provider]  carvedilol (COREG) 3.125 MG tablet Take 3.125 mg by mouth daily.    [provider]  clopidogrel (PLAVIX) 75 MG tablet TAKE 1 TABLET BY MOUTH ONCE DAILY 11/10/20   Minna Merritts, MD  DULoxetine (CYMBALTA) 30 MG capsule Take 1 capsule (30 mg total) by mouth at bedtime. 09/15/20   Narda Amber K, DO  empagliflozin (JARDIANCE) 10 MG TABS tablet Take 1 tablet (10 mg total) by mouth daily before breakfast. 10/16/20   Bensimhon, Shaune Pascal, MD  ferrous sulfate 325 (65 FE) MG tablet Take 325 mg by mouth 2 (two) times daily with a meal.    [provider]  gabapentin (NEURONTIN) 600 MG tablet TAKE 1 TABLET BY IN THE MORNING AND 2 ATBEDTIME 10/02/20   [name], MD  insulin aspart (NOVOLOG FLEXPEN) 100 UNIT/ML FlexPen Inject 5 Units into the skin 2 (two) times daily with a meal. 09/28/20   [name], MD  Insulin Pen Needle (PEN NEEDLES) 32G X 6 MM MISC 1 each by Does not apply route 4  (four) times daily. 09/06/19   Daleen Squibb, MD  isosorbide mononitrate (IMDUR) 30 MG 24 hr tablet Take 1 tablet (30 mg total) by mouth daily. Needs appt 05/16/20   Bensimhon, Shaune Pascal, MD  LANTUS SOLOSTAR 100 UNIT/ML Solostar Pen INJECT 22 UNITS UNDER THE SKIN DAILY. 10/02/20   [name], MD  levothyroxine (SYNTHROID) 50 MCG tablet TAKE 1 TABLET BY MOUTH ONCE DAILY BEFOREBREAKFAST 10/30/20   [name], MD  losartan (COZAAR) 25  MG tablet Take 0.5 tablets (12.5 mg total) by mouth daily. Needs appt 05/16/20   Bensimhon, Shaune Pascal, MD  Multiple Vitamin (MULTIVITAMIN) tablet Take 1 tablet by mouth daily.    [provider]  nitroGLYCERIN (NITROSTAT) 0.4 MG SL tablet Place 1 tablet (0.4 mg total) under the tongue every 5 (five) minutes as needed for chest pain. 03/28/20 06/26/20  Deboraha Sprang, MD  Omega-3 Fatty Acids (FISH OIL) 1000 MG CAPS Take 1 capsule by mouth 2 (two) times a day.     [provider]  ONETOUCH ULTRA test strip UUD TO CHECK BLOOD SUGAR 3 TIMES DAILY 03/02/20   Jacelyn Pi, Lilia Argue, MD  rosuvastatin (CRESTOR) 10 MG tablet TAKE 1 TABLET BY MOUTH ONCE DAILY 11/15/20   [name], MD  torsemide (DEMADEX) 20 MG tablet TAKE 4 TABLETS (80 MG TOTAL) IN THE MORNING AND 2 TABS (40 MG) IN THE AFTERNOON. 11/10/20   Minna Merritts, MD  traMADol (ULTRAM) 50 MG tablet TAKE 1 TO 2 TABLETS BY MOUTH AT BEDTIME AS NEEDED 10/16/20   [name], MD      VITAL SIGNS:  Blood pressure (!) 127/53, temperature 98.3 F (36.8 C), temperature source Oral, resp. rate 15, height 5\' 8"  (1.727 m), weight 121.8 kg, SpO2 95 %.  PHYSICAL EXAMINATION:  Physical Exam  GENERAL:  65 y.o.-year-old Caucasian female patient lying in the bed with no acute distress.  EYES: Pupils equal, round, reactive to light and accommodation. No scleral icterus. Extraocular muscles intact.  HEENT: Head atraumatic, normocephalic. Oropharynx and nasopharynx clear.  NECK:  Supple, no  jugular venous distention. No thyroid enlargement, no tenderness.  LUNGS: Normal breath sounds bilaterally, no wheezing, rales,rhonchi or crepitation. No use of accessory muscles of respiration.  CARDIOVASCULAR: Regular rate and rhythm, S1, S2 normal. No murmurs, rubs, or gallops.  ABDOMEN: Soft, nondistended, nontender. Bowel sounds present. No organomegaly or mass.  EXTREMITIES: No pedal edema, cyanosis, or clubbing.  NEUROLOGIC: Cranial nerves II through XII are intact. Muscle strength 5/5 in all extremities. Sensation intact. Gait not checked.  PSYCHIATRIC: The patient is alert and oriented x 3.  Normal affect and good eye contact. SKIN: Swelling with tenderness and erythema of the big toe extending to her foot with plantar ulcerated callus and surrounding swelling in tenderness.        LABORATORY PANEL:   CBC Recent Labs  Lab 11/24/20 1752  WBC 12.1*  HGB 12.5  HCT 38.7  PLT 158   ------------------------------------------------------------------------------------------------------------------  Chemistries  Recent Labs  Lab 11/24/20 1752  NA 134*  K 4.0  CL 93*  CO2 29  GLUCOSE 262*  BUN 65*  CREATININE 2.20*  CALCIUM 9.0  AST 30  ALT 28  ALKPHOS 88  BILITOT 0.7   ------------------------------------------------------------------------------------------------------------------  Cardiac Enzymes No results for input(s): TROPONINI in the last 168 hours. ------------------------------------------------------------------------------------------------------------------  RADIOLOGY:  No results found.    IMPRESSION AND PLAN:  Active Problems:   Cellulitis of left foot 1.  Left foot moderate to severe nonpurulent cellulitis with suspected osteomyelitis. -The patient will be admitted to a medical bed. -We will continue IV antibiotic therapy with IV vancomycin and Zosyn. -Pain management will be provided. -Left foot MRI without contrast will be  obtained. -Podiatry consult will be obtained. -I notified Dr. Amalia Hailey about the patient and discussed the case with him.  2.  Asymptomatic COVID-19 infection. -The patient will be placed on IV remdesivir. -We will place her on vitamin D3, vitamin C  and zinc sulfate. -No current hypoxia to necessitate steroid therapy. -We will follow inflammatory markers though.  3.  Acute kidney injury on top of stage IV chronic kidney disease. -The patient will be hydrated with IV normal saline and will follow her BMP.  4.  Type 2 diabetes mellitus with peripheral neuropathy -The patient will be placed on supplement coverage with NovoLog I will continue her basal coverage. -I will continue her Jardiance holding Amaryl when the patient is n.p.o. -We will continue Neurontin..  5.  Hypothyroidism. -We will continue Synthroid and check TSH level.  6.  Coronary artery disease. -We will continue Imdur and as needed sublingual nitroglycerin, statin therapy and hold Plavix.  7.  Paroxysmal atrial fibrillation. -We will continue amiodarone and check EKG.  8.  Dyslipidemia. -We will continue statin therapy.  DVT prophylaxis: Lovenox. Code Status: full code. Family Communication:  The plan of care was discussed in details with the patient (and family). I answered all questions. The patient agreed to proceed with the above mentioned plan. Further management will depend upon hospital course. Disposition Plan: Back to previous home environment Consults called: Podiatry consult to Dr. Amalia Hailey. All the records are reviewed and case discussed with ED provider.  Status is: Inpatient  Remains inpatient appropriate because:Ongoing active pain requiring inpatient pain management, Ongoing diagnostic testing needed not appropriate for outpatient work up, Unsafe d/c plan, IV treatments appropriate due to intensity of illness or inability to take PO and Inpatient level of care appropriate due to severity of  illness   Dispo: The patient is from: Home              Anticipated d/c is to: SNF              Anticipated d/c date is: 3 days              Patient currently is not medically stable to d/c.   Difficult to place patient No  TOTAL TIME TAKING CARE OF THIS PATIENT: 55 minutes.    Christel Mormon M.D on 11/25/2020 at 5:22 AM  [facility]   From 7 PM-7 AM, contact night-coverage [email or web address]  CC: Primary care physician; [name], MD

## 2020-11-25 NOTE — Evaluation (Signed)
Physical Therapy Evaluation Patient Details Name: [name] MRN: [id number] DOB: Sep 03, 1956 Today's Date: 11/25/2020   History of Present Illness  65 y.o. Caucasian female with medical history significant for coronary artery disease, hypertension, obstructive sleep apnea, type 2 diabetes mellitus, systolic CHF status post AICD due to episodes of V. tach in the past and previous history of osteomyelitis of the ankle and foot and depression, who presented to the emergency room 11/24/20 with acute onset of worsening left foot swelling with erythema. Transferred from [facility] to [facility] for MRI of foot.  Clinical Impression   Pt admitted with above diagnosis. Patient reports began ambulating with left camboot and cane ~2 weeks ago. During evaluation, noted pt with very poor balance reactions with eyes closed (simulating dark environment) with fall backwards into chair positioned behind her. Patient agreeable to PT for balance training.  Pt currently with functional limitations due to the deficits listed below (see PT Problem List). Pt will benefit from skilled PT to increase their independence and safety with mobility to allow discharge to the venue listed below.       Follow Up Recommendations Outpatient PT (for balance and vestibular training)    Equipment Recommendations  None recommended by PT    Recommendations for Other Services       Precautions / Restrictions Precautions Precautions: Fall Required Braces or Orthoses: Other Brace Other Brace: RLE camwalker Restrictions Weight Bearing Restrictions: No Other Position/Activity Restrictions: pt reports wearing camwalking boot x 2 weeks      Mobility  Bed Mobility                    Transfers Overall transfer level: Needs assistance Equipment used: Straight cane Transfers: Sit to/from Stand Sit to Stand: Min guard         General transfer comment: initial sit to stand, pt began donning mask as coming to stand and with  LOB backwards into chair. 2nd attempt eyes open and no LOB  Ambulation/Gait Ambulation/Gait assistance: Modified independent (Device/Increase time) Gait Distance (Feet): 300 Feet Assistive device: Straight cane Gait Pattern/deviations: Step-through pattern;Decreased stance time - left;Decreased weight shift to left Gait velocity: WNL   General Gait Details: walked with camwalker on LLE and sneaker on RLE. Good balance during ambulation with cane.  Stairs            Wheelchair Mobility    Modified Rankin (Stroke Patients Only)       Balance Overall balance assessment: Needs assistance                             High Level Balance Comments: feet apart, eyes closed with immediate posterior imbalance and fall into chair; discussed need for lighting at all times due to vision-dependent for her balance. She denies h/o inner ear problems             Pertinent Vitals/Pain Pain Assessment: No/denies pain    Home Living Family/patient expects to be discharged to:: Private residence Living Arrangements: Parent Available Help at Discharge: Family;Available 24 hours/day Type of Home: House Home Access: Stairs to enter   [employer] of Steps: 2 Home Layout: One level Home Equipment: Walker - 2 wheels;Cane - single point;Bedside commode;Shower seat;Wheelchair - manual      Prior Function Level of Independence: Independent with assistive device(s)         Comments: using cane since began wearing camwalker     Hand Dominance  Extremity/Trunk Assessment   Upper Extremity Assessment Upper Extremity Assessment: Overall WFL for tasks assessed    Lower Extremity Assessment Lower Extremity Assessment: RLE deficits/detail;LLE deficits/detail RLE Sensation: history of peripheral neuropathy LLE Sensation: history of peripheral neuropathy    Cervical / Trunk Assessment Cervical / Trunk Assessment: Other exceptions Cervical / Trunk  Exceptions: overweight  Communication   Communication: No difficulties  Cognition Arousal/Alertness: Awake/alert Behavior During Therapy: WFL for tasks assessed/performed Overall Cognitive Status: Within Functional Limits for tasks assessed                                        General Comments General comments (skin integrity, edema, etc.): Discussed role of PT in improving balance and pt is interested in a balance program and follow-up PT    Exercises     Assessment/Plan    PT Assessment Patient needs continued PT services  PT Problem List Decreased balance;Decreased knowledge of precautions;Obesity       PT Treatment Interventions DME instruction;Gait training;Stair training;Functional mobility training;Therapeutic activities;Therapeutic exercise;Balance training;Patient/family education    PT Goals (Current goals can be found in the Care Plan section)  Acute Rehab PT Goals Patient Stated Goal: keep her track record of no falls PT Goal Formulation: With patient Time For Goal Achievement: 12/09/20 Potential to Achieve Goals: Good    Frequency Min 3X/week   Barriers to discharge        Co-evaluation               AM-PAC PT "6 Clicks" Mobility  Outcome Measure Help needed turning from your back to your side while in a flat bed without using bedrails?: None Help needed moving from lying on your back to sitting on the side of a flat bed without using bedrails?: None Help needed moving to and from a bed to a chair (including a wheelchair)?: A Little Help needed standing up from a chair using your arms (e.g., wheelchair or bedside chair)?: A Little Help needed to walk in hospital room?: A Little Help needed climbing 3-5 steps with a railing? : A Little 6 Click Score: 20    End of Session   Activity Tolerance: Patient tolerated treatment well Patient left: in chair;with call bell/phone within reach Nurse Communication: Mobility status (OK to  ambulate to bathroom with lights on, camwalker, shoe and cane) PT Visit Diagnosis: Unsteadiness on feet (R26.81);Other abnormalities of gait and mobility (R26.89)    Time: 0071-2197 PT Time Calculation (min) (ACUTE ONLY): 17 min   Charges:   PT Evaluation $PT Eval Low Complexity: 1 Low           Arby Barrette, PT Pager [phone]   Rexanne Mano 11/25/2020, 5:20 PM

## 2020-11-26 LAB — CBC WITH DIFFERENTIAL/PLATELET
Abs Immature Granulocytes: 0.02 10*3/uL (ref 0.00–0.07)
Basophils Absolute: 0 10*3/uL (ref 0.0–0.1)
Basophils Relative: 0 %
Eosinophils Absolute: 0.1 10*3/uL (ref 0.0–0.5)
Eosinophils Relative: 1 %
HCT: 34.6 % — ABNORMAL LOW (ref 36.0–46.0)
Hemoglobin: 11.4 g/dL — ABNORMAL LOW (ref 12.0–15.0)
Immature Granulocytes: 0 %
Lymphocytes Relative: 16 %
Lymphs Abs: 1.2 10*3/uL (ref 0.7–4.0)
MCH: 31.6 pg (ref 26.0–34.0)
MCHC: 32.9 g/dL (ref 30.0–36.0)
MCV: 95.8 fL (ref 80.0–100.0)
Monocytes Absolute: 0.6 10*3/uL (ref 0.1–1.0)
Monocytes Relative: 8 %
Neutro Abs: 6 10*3/uL (ref 1.7–7.7)
Neutrophils Relative %: 75 %
Platelets: 137 10*3/uL — ABNORMAL LOW (ref 150–400)
RBC: 3.61 MIL/uL — ABNORMAL LOW (ref 3.87–5.11)
RDW: 13.3 % (ref 11.5–15.5)
WBC: 7.9 10*3/uL (ref 4.0–10.5)
nRBC: 0 % (ref 0.0–0.2)

## 2020-11-26 LAB — MAGNESIUM: Magnesium: 2.6 mg/dL — ABNORMAL HIGH (ref 1.7–2.4)

## 2020-11-26 LAB — COMPREHENSIVE METABOLIC PANEL
ALT: 23 U/L (ref 0–44)
AST: 22 U/L (ref 15–41)
Albumin: 3 g/dL — ABNORMAL LOW (ref 3.5–5.0)
Alkaline Phosphatase: 67 U/L (ref 38–126)
Anion gap: 10 (ref 5–15)
BUN: 56 mg/dL — ABNORMAL HIGH (ref 8–23)
CO2: 28 mmol/L (ref 22–32)
Calcium: 9.1 mg/dL (ref 8.9–10.3)
Chloride: 99 mmol/L (ref 98–111)
Creatinine, Ser: 2.27 mg/dL — ABNORMAL HIGH (ref 0.44–1.00)
GFR, Estimated: 23 mL/min — ABNORMAL LOW (ref >60)
Glucose, Bld: 157 mg/dL — ABNORMAL HIGH (ref 70–99)
Potassium: 4.2 mmol/L (ref 3.5–5.1)
Sodium: 137 mmol/L (ref 135–145)
Total Bilirubin: 0.7 mg/dL (ref 0.3–1.2)
Total Protein: 6.5 g/dL (ref 6.5–8.1)

## 2020-11-26 LAB — C-REACTIVE PROTEIN: CRP: 12.7 mg/dL — ABNORMAL HIGH (ref <1.0)

## 2020-11-26 LAB — PROCALCITONIN: Procalcitonin: 0.21 ng/mL

## 2020-11-26 LAB — GLUCOSE, CAPILLARY
Glucose-Capillary: 120 mg/dL — ABNORMAL HIGH (ref 70–99)
Glucose-Capillary: 427 mg/dL — ABNORMAL HIGH (ref 70–99)
Glucose-Capillary: 172 mg/dL — ABNORMAL HIGH (ref 70–99)
Glucose-Capillary: 166 mg/dL — ABNORMAL HIGH (ref 70–99)

## 2020-11-26 LAB — D-DIMER, QUANTITATIVE: D-Dimer, Quant: 1.22 ug/mL-FEU — ABNORMAL HIGH (ref 0.00–0.50)

## 2020-11-26 LAB — BRAIN NATRIURETIC PEPTIDE: B Natriuretic Peptide: 92.8 pg/mL (ref 0.0–100.0)

## 2020-11-26 MED ORDER — INSULIN GLARGINE 100 UNIT/ML ~~LOC~~ SOLN
30.0000 [IU] | Freq: Every day | SUBCUTANEOUS | Status: DC
  Administered 2020-11-26 – 2020-11-30 (×5): 30 [IU] via SUBCUTANEOUS
  Filled 2020-11-26 (×5): qty 0.3

## 2020-11-26 MED ORDER — INSULIN ASPART 100 UNIT/ML ~~LOC~~ SOLN
0.0000 [IU] | Freq: Three times a day (TID) | SUBCUTANEOUS | Status: DC
  Administered 2020-11-26 – 2020-11-27 (×2): 4 [IU] via SUBCUTANEOUS

## 2020-11-26 MED ORDER — INSULIN ASPART 100 UNIT/ML ~~LOC~~ SOLN
3.0000 [IU] | Freq: Three times a day (TID) | SUBCUTANEOUS | Status: DC
  Administered 2020-11-26 – 2020-11-28 (×5): 3 [IU] via SUBCUTANEOUS

## 2020-11-26 MED ORDER — INSULIN ASPART 100 UNIT/ML ~~LOC~~ SOLN: 0.0000 [IU] | Freq: Every day | SUBCUTANEOUS | Status: DC

## 2020-11-26 MED ORDER — INSULIN ASPART 100 UNIT/ML ~~LOC~~ SOLN: 3.0000 [IU] | Freq: Three times a day (TID) | SUBCUTANEOUS | Status: DC

## 2020-11-26 MED ORDER — INSULIN GLARGINE 100 UNIT/ML ~~LOC~~ SOLN: 30.0000 [IU] | Freq: Two times a day (BID) | SUBCUTANEOUS | Status: DC

## 2020-11-26 MED ORDER — INSULIN ASPART 100 UNIT/ML ~~LOC~~ SOLN: 25.0000 [IU] | Freq: Once | SUBCUTANEOUS | Status: DC

## 2020-11-26 MED ORDER — INSULIN ASPART 100 UNIT/ML ~~LOC~~ SOLN
10.0000 [IU] | Freq: Once | SUBCUTANEOUS | Status: AC
  Administered 2020-11-26: 10 [IU] via SUBCUTANEOUS

## 2020-11-26 NOTE — Evaluation (Signed)
Occupational Therapy Evaluation Patient Details Name: [name] MRN: [id number] DOB: 02-23-1956 Today's Date: 11/26/2020    History of Present Illness 65 y.o. Caucasian female with medical history significant for coronary artery disease, hypertension, obstructive sleep apnea, type 2 diabetes mellitus, systolic CHF status post AICD due to episodes of V. tach in the past and previous history of osteomyelitis of the ankle and foot and depression, who presented to the emergency room 11/24/20 with acute onset of worsening left foot swelling with erythema. Transferred from [facility] to [facility] for MRI of foot.   Clinical Impression   Patient evaluated by Occupational Therapy with no further acute OT needs identified. All education has been completed and the patient has no further questions. Pt is able to perform ADLs mod I.  See below for any follow-up Occupational Therapy or equipment needs. OT is signing off. Thank you for this referral.      Follow Up Recommendations  No OT follow up    Equipment Recommendations  None recommended by OT    Recommendations for Other Services       Precautions / Restrictions Precautions Precautions: Fall Required Braces or Orthoses: Other Brace Other Brace: RLE camwalker Restrictions Weight Bearing Restrictions: No Other Position/Activity Restrictions: pt reports wearing camwalking boot x 2 weeks      Mobility Bed Mobility                    Transfers Overall transfer level: Modified independent                    Balance Overall balance assessment: Needs assistance Sitting-balance support: Feet supported;No upper extremity supported Sitting balance-Leahy Scale: Good     Standing balance support: During functional activity;No upper extremity supported Standing balance-Leahy Scale: Fair                             ADL either performed or assessed with clinical judgement   ADL Overall ADL's : Modified independent                                        General ADL Comments: Pt is able to perform ADLs mod I with SPC from sit to stand level.  She demonstrates good safety awareness     Vision Baseline Vision/History: Wears glasses Wears Glasses: At all times Patient Visual Report: No change from baseline       Perception     Praxis      Pertinent Vitals/Pain Pain Assessment: Faces Faces Pain Scale: Hurts little more Pain Location: Lt foog Pain Descriptors / Indicators: Aching Pain Intervention(s): Monitored during session     Hand Dominance Right   Extremity/Trunk Assessment Upper Extremity Assessment Upper Extremity Assessment: Overall WFL for tasks assessed   Lower Extremity Assessment Lower Extremity Assessment: Defer to PT evaluation LLE Sensation: history of peripheral neuropathy       Communication Communication Communication: No difficulties   Cognition Arousal/Alertness: Awake/alert Behavior During Therapy: WFL for tasks assessed/performed Overall Cognitive Status: Within Functional Limits for tasks assessed                                     General Comments  VSS    Exercises     Shoulder Instructions  Home Living Family/patient expects to be discharged to:: Private residence Living Arrangements: Parent Available Help at Discharge: Family;Available 24 hours/day Type of Home: House Home Access: Stairs to enter [employer] of Steps: 2   Home Layout: One level     Bathroom Shower/Tub: Occupational psychologist: Handicapped height     Home Equipment: Environmental consultant - 2 wheels;Cane - single point;Bedside commode;Shower seat;Wheelchair - manual   Additional Comments: currently living with her mother and is fixing up a mobile home to move into      Prior Functioning/Environment Level of Independence: Independent with assistive device(s)        Comments: using cane since began wearing camwalker.  Drives.  Retired        OT Problem List: Impaired balance (sitting and/or standing);Pain      OT Treatment/Interventions:      OT Goals(Current goals can be found in the care plan section) Acute Rehab OT Goals Patient Stated Goal: for Lt foot to heal OT Goal Formulation: All assessment and education complete, DC therapy  OT Frequency:     Barriers to D/C:            Co-evaluation              AM-PAC OT "6 Clicks" Daily Activity     Outcome Measure Help from another person eating meals?: None Help from another person taking care of personal grooming?: None Help from another person toileting, which includes using toliet, bedpan, or urinal?: None Help from another person bathing (including washing, rinsing, drying)?: None Help from another person to put on and taking off regular upper body clothing?: None Help from another person to put on and taking off regular lower body clothing?: None 6 Click Score: 24   End of Session Equipment Utilized During Treatment: Other (comment) [facility]) Nurse Communication: Mobility status  Activity Tolerance: Patient tolerated treatment well Patient left: in chair;with call bell/phone within reach  OT Visit Diagnosis: Unsteadiness on feet (R26.81)                Time: 1443-1500 OT Time Calculation (min): 17 min Charges:  OT General Charges $OT Visit: 1 Visit OT Evaluation $OT Eval Low Complexity: 1 Low  Nilsa Nutting., OTR/L Acute Rehabilitation Services Pager [phone] Office [phone]   Lucille Passy M 11/26/2020, 3:52 PM

## 2020-11-26 NOTE — Progress Notes (Signed)
PROGRESS NOTE                                                                                                                                                                                                             Patient Demographics:    [name], is a 65 y.o. female, DOB - 1956/08/14, [id number]  Outpatient Primary MD for the patient is [name], MD    LOS - 1  Admit date - 11/25/2020    No chief complaint on file.      Brief Narrative (HPI from H&P) - 65 y.o. Caucasian female with medical history significant for coronary artery disease, hypertension, obstructive sleep apnea, type 2 diabetes mellitus, systolic CHF status post AICD due to episodes of V. tach in the past and previous history of osteomyelitis of the ankle and foot and depression, who presented to the emergency room with acute onset of worsening left foot swelling with erythema for the last month with left foot wound which has been followed by podiatry, on initial x-rays there was evidence of some subcutaneous air in the left foot and she was transferred from [facility] to [facility] for AICD compatible MRI.   Subjective:   Patient in bed, appears comfortable, denies any headache, no fever, no chest pain or pressure, no shortness of breath , no abdominal pain. No focal weakness.   Assessment  & Plan :     1.  Left big toe and foot cellulitis acute on chronic.  Being followed outpatient with podiatry, MRI will be tried if AICD is compatible on 11/27/2020 due to staffing issues, Dr. Amalia Hailey following, continue empiric antibiotic, currently not septic.  2.  CAD with history of chronic diastolic CHF.  Last echo few months ago shows EF of 55%, has AICD. She follows with Dr. Caryl Comes for her cardiology issues, currently symptom-free, EKG is paced, no chest symptoms currently, continue combination of amiodarone, Coreg, Plavix, Imdur, ARB, statin for secondary  prevention.  Note home dose diuretics have been reduced will monitor and adjust as needed.  Note patient is on amiodarone and has AICD for history of V. tach in the past.  Note does not have confirmed paroxysmal A. fib in the chart.  3. Incidental COVID-19 infection and fully vaccinated and boosted patient.  Supportive care. Encouraged the patient to sit up in  chair in the daytime use I-S and flutter valve for pulmonary toiletry.   4.  Morbid obesity BMI 40.  Follow with PCP for weight loss.  5.  OSA.  CPAP nightly.  6.  Dyslipidemia.  On statin.  7.  CKD4.  Baseline creatinine around 2.2.  Monitor.  8.  DM type II.  On Lantus plus sliding scale for now.  Lab Results  Component Value Date   HGBA1C 8.1 (H) 11/25/2020    CBG (last 3)  Recent Labs    11/25/20 1712 11/25/20 2036 11/26/20 0736  GLUCAP 214* 254* 120*     SpO2: 97 %  Recent Labs  Lab 11/24/20 1752 11/24/20 2007 11/24/20 2040 11/25/20 0554 11/26/20 0130 11/26/20 0133  WBC 12.1*  --   --  9.1 7.9  --   HGB 12.5  --   --  12.4 11.4*  --   HCT 38.7  --   --  39.0 34.6*  --   PLT 158  --   --  138* 137*  --   CRP  --  2.7*  --  7.6* 12.7*  --   BNP  --   --   --  304.7*  --  92.8  DDIMER  --   --   --  1.54* 1.22*  --   PROCALCITON  --   --   --  0.17 0.21  --   AST 30  --   --  22 22  --   ALT 28  --   --  24 23  --   ALKPHOS 88  --   --  70 67  --   BILITOT 0.7  --   --  0.8 0.7  --   ALBUMIN 4.1  --   --  3.4* 3.0*  --   SARSCOV2NAA  --   --  POSITIVE*  --   --   --          Condition - Extremely Guarded  Family Communication  : Daughter [name] [phone] on 11/25/2020 message left at 9:33 AM.  Code Status :  Full  Consults  :  Podiatry Dr Amalia Hailey  PUD Prophylaxis :    Procedures  :     MRI -      Disposition Plan  :    Status is: Inpatient  Remains inpatient appropriate because:IV treatments appropriate due to intensity of illness or inability to take PO   Dispo: The patient is  from: Home              Anticipated d/c is to: Home              Anticipated d/c date is: > 3 days              Patient currently is not medically stable to d/c.   Difficult to place patient No  DVT Prophylaxis  :  Lovenox    Lab Results  Component Value Date   PLT 137 (L) 11/26/2020    Diet :  Diet Order            Diet heart healthy/carb modified Room service appropriate? Yes; Fluid consistency: Thin  Diet effective now                  Inpatient Medications  Scheduled Meds: . amiodarone  200 mg Oral Daily  . vitamin C  500 mg Oral Daily  . calcium-vitamin D  1 tablet Oral BID WC  .  carvedilol  3.125 mg Oral Q breakfast  . chlorhexidine  15 mL Mouth Rinse BID  . cholecalciferol  1,000 Units Oral Daily  . clopidogrel  75 mg Oral Daily  . DULoxetine  30 mg Oral QHS  . empagliflozin  10 mg Oral QAC breakfast  . enoxaparin (LOVENOX) injection  60 mg Subcutaneous Q24H  . ferrous sulfate  325 mg Oral BID WC  . gabapentin  1,200 mg Oral QHS  . gabapentin  600 mg Oral Daily  . insulin aspart  0-15 Units Subcutaneous TID WC  . insulin aspart  0-5 Units Subcutaneous QHS  . insulin glargine  22 Units Subcutaneous Q2200  . isosorbide mononitrate  30 mg Oral Daily  . levothyroxine  50 mcg Oral Q0600  . mouth rinse  15 mL Mouth Rinse q12n4p  . multivitamin with minerals  1 tablet Oral Daily  . omega-3 acid ethyl esters  1 g Oral Daily  . rosuvastatin  10 mg Oral Daily  . torsemide  20 mg Oral q1800  . zinc sulfate  220 mg Oral Daily   Continuous Infusions: . piperacillin-tazobactam (ZOSYN)  IV 3.375 g (11/26/20 0403)  . remdesivir 100 mg in NS 100 mL 100 mg (11/26/20 1056)  . [START ON 11/27/2020] vancomycin     PRN Meds:.acetaminophen **OR** [DISCONTINUED] acetaminophen, albuterol, chlorpheniramine-HYDROcodone, guaiFENesin-dextromethorphan, magnesium hydroxide, morphine injection, nitroGLYCERIN, ondansetron **OR** ondansetron (ZOFRAN) IV, traMADol, traZODone  Antibiotics   :    Anti-infectives (From admission, onward)   Start     Dose/Rate Route Frequency Ordered Stop   11/27/20 0000  vancomycin (VANCOREADY) IVPB 1500 mg/300 mL        1,500 mg 150 mL/hr over 120 Minutes Intravenous Every 48 hours 11/25/20 0607     11/26/20 1000  remdesivir 100 mg in sodium chloride 0.9 % 100 mL IVPB       "Followed by" Linked Group Details   100 mg 200 mL/hr over 30 Minutes Intravenous Daily 11/25/20 0550 11/30/20 0959   11/25/20 1200  piperacillin-tazobactam (ZOSYN) IVPB 3.375 g        3.375 g 12.5 mL/hr over 240 Minutes Intravenous Every 8 hours 11/25/20 0555     11/25/20 0800  remdesivir 200 mg in sodium chloride 0.9% 250 mL IVPB       "Followed by" Linked Group Details   200 mg 580 mL/hr over 30 Minutes Intravenous Once 11/25/20 0550 11/25/20 0925   11/25/20 0615  vancomycin (VANCOREADY) IVPB 1000 mg/200 mL  Status:  Discontinued        1,000 mg 200 mL/hr over 60 Minutes Intravenous  Once 11/25/20 0520 11/25/20 0530   11/25/20 0600  piperacillin-tazobactam (ZOSYN) IVPB 3.375 g        3.375 g 100 mL/hr over 30 Minutes Intravenous  Once 11/25/20 0520 11/25/20 0855       Time Spent in minutes  30   Lala Lund M.D on 11/26/2020 at 11:02 AM  To page go to [email or web address]   [facility] -  Office  [phone]     See all Orders from today for further details    Objective:   Vitals:   11/25/20 0756 11/25/20 1213 11/25/20 2039 11/26/20 0548  BP: (!) 117/48 (!) 91/56 (!) 147/55 (!) 115/56  Pulse: 63 60 64 65  Resp: 15 14 16 15   Temp: 98.3 F (36.8 C) 97.9 F (36.6 C) 98.4 F (36.9 C) 98.3 F (36.8 C)  TempSrc: Oral Oral Oral Axillary  SpO2: 97% 93% 98% 97%  Weight:      Height:        Wt Readings from Last 3 Encounters:  11/25/20 121.8 kg  11/24/20 117.9 kg  10/16/20 120.7 kg     Intake/Output Summary (Last 24 hours) at 11/26/2020 1102 Last data filed at 11/26/2020 7253 Gross per 24 hour  Intake 630 ml  Output 1000 ml  Net -370  ml     Physical Exam  Awake Alert, No new F.N deficits, Normal affect Fraser.AT,PERRAL Supple Neck,No JVD, No cervical lymphadenopathy appriciated.  Symmetrical Chest wall movement, Good air movement bilaterally, CTAB RRR,No Gallops,Rubs or new Murmurs, No Parasternal Heave +ve B.Sounds, Abd Soft, No tenderness, No organomegaly appriciated, No rebound - guarding or rigidity. L Big toe and forefoot cellulitis, amputated left and right second toe    Data Review:    CBC Recent Labs  Lab 11/24/20 1752 11/25/20 0554 11/26/20 0130  WBC 12.1* 9.1 7.9  HGB 12.5 12.4 11.4*  HCT 38.7 39.0 34.6*  PLT 158 138* 137*  MCV 95.8 97.7 95.8  MCH 30.9 31.1 31.6  MCHC 32.3 31.8 32.9  RDW 13.2 13.2 13.3  LYMPHSABS 1.4 1.5 1.2  MONOABS 0.9 0.7 0.6  EOSABS 0.0 0.0 0.1  BASOSABS 0.0 0.0 0.0    Recent Labs  Lab 11/24/20 1752 11/24/20 2007 11/25/20 0554 11/26/20 0130 11/26/20 0133  NA 134*  --  138 137  --   K 4.0  --  3.8 4.2  --   CL 93*  --  98 99  --   CO2 29  --  26 28  --   GLUCOSE 262*  --  213* 157*  --   BUN 65*  --  60* 56*  --   CREATININE 2.20*  --  2.12* 2.27*  --   CALCIUM 9.0  --  8.8* 9.1  --   AST 30  --  22 22  --   ALT 28  --  24 23  --   ALKPHOS 88  --  70 67  --   BILITOT 0.7  --  0.8 0.7  --   ALBUMIN 4.1  --  3.4* 3.0*  --   MG  --   --  2.3 2.6*  --   CRP  --  2.7* 7.6* 12.7*  --   DDIMER  --   --  1.54* 1.22*  --   PROCALCITON  --   --  0.17 0.21  --   HGBA1C  --   --  8.1*  --   --   BNP  --   --  304.7*  --  92.8    ------------------------------------------------------------------------------------------------------------------ No results for input(s): CHOL, HDL, LDLCALC, TRIG, CHOLHDL, LDLDIRECT in the last 72 hours.  Lab Results  Component Value Date   HGBA1C 8.1 (H) 11/25/2020   ------------------------------------------------------------------------------------------------------------------ No results for input(s): TSH, T4TOTAL, T3FREE,  THYROIDAB in the last 72 hours.  Invalid input(s): FREET3  Cardiac Enzymes No results for input(s): CKMB, TROPONINI, MYOGLOBIN in the last 168 hours.  Invalid input(s): CK ------------------------------------------------------------------------------------------------------------------    Component Value Date/Time   BNP 92.8 11/26/2020 0133    Micro Results Recent Results (from the past 240 hour(s))  Aerobic Culture w Gram Stain (superficial specimen)     Status: None (Preliminary result)   Collection Time: 11/24/20  8:01 PM   Specimen: Foot; Wound  Result Value Ref Range Status   Specimen Description   Final    FOOT Performed at [facility],  [place], [place] [address]    Special Requests   Final    Normal Performed at [facility], [place], [place] [address]    Gram Stain   Final    NO WBC SEEN ABUNDANT GRAM POSITIVE COCCI RARE GRAM NEGATIVE RODS    Culture   Final    FEW GRAM NEGATIVE RODS CULTURE REINCUBATED FOR BETTER GROWTH Performed at [facility], [place] [address] [place], [place] [address]    Report Status PENDING  Incomplete  Blood culture (routine x 2)     Status: None (Preliminary result)   Collection Time: 11/24/20  8:07 PM   Specimen: BLOOD  Result Value Ref Range Status   Specimen Description BLOOD RIGHT ANTECUBITAL  Final   Special Requests   Final    BOTTLES DRAWN AEROBIC AND ANAEROBIC Blood Culture results may not be optimal due to an inadequate volume of blood received in culture bottles   Culture   Final    NO GROWTH 2 DAYS Performed at [facility], [address], [place], [place] [address]    Report Status PENDING  Incomplete  Blood culture (routine x 2)     Status: None (Preliminary result)   Collection Time: 11/24/20  8:07 PM   Specimen: BLOOD  Result Value Ref Range Status   Specimen Description BLOOD LEFT ANTECUBITAL  Final   Special Requests   Final    BOTTLES DRAWN  AEROBIC AND ANAEROBIC Blood Culture results may not be optimal due to an inadequate volume of blood received in culture bottles   Culture   Final    NO GROWTH 2 DAYS Performed at [facility], [address], [place], [place] [address]    Report Status PENDING  Incomplete  Resp Panel by RT-PCR (Flu A&B, Covid) Nasopharyngeal Swab     Status: Abnormal   Collection Time: 11/24/20  8:40 PM   Specimen: Nasopharyngeal Swab; Nasopharyngeal(NP) swabs in vial transport medium  Result Value Ref Range Status   SARS Coronavirus 2 by RT PCR POSITIVE (A) NEGATIVE Final    Comment: RESULT CALLED TO, READ BACK BY AND VERIFIED WITH: Herington RN 11/24/2020 2144 JG (NOTE) SARS-CoV-2 target nucleic acids are DETECTED.  The SARS-CoV-2 RNA is generally detectable in upper respiratory specimens during the acute phase of infection. Positive results are indicative of the presence of the identified virus, but do not rule out bacterial infection or co-infection with other pathogens not detected by the test. Clinical correlation with patient history and other diagnostic information is necessary to determine patient infection status. The expected result is Negative.  Fact Sheet for Patients: EntrepreneurPulse.com.au  Fact Sheet for Healthcare Providers: IncredibleEmployment.be  This test is not yet approved or cleared by the [place] FDA and  has been authorized for detection and/or diagnosis of SARS-CoV-2 by FDA under an Emergency Use Authorization (EUA).  This EUA will remain in effect (meaning this test can  be used) for the duration of  the COVID-19 declaration under Section 564(b)(1) of the Act, 21 U.S.C. section 360bbb-3(b)(1), unless the authorization is terminated or revoked sooner.     Influenza A by PCR NEGATIVE NEGATIVE Final   Influenza B by PCR NEGATIVE NEGATIVE Final    Comment: (NOTE) The Xpert Xpress SARS-CoV-2/FLU/RSV plus assay is  intended as an aid in the diagnosis of influenza from Nasopharyngeal swab specimens and should not be used as a sole basis for treatment. Nasal washings and aspirates  are unacceptable for Xpert Xpress SARS-CoV-2/FLU/RSV testing.  Fact Sheet for Patients: EntrepreneurPulse.com.au  Fact Sheet for Healthcare Providers: IncredibleEmployment.be  This test is not yet approved or cleared by the [place] FDA and has been authorized for detection and/or diagnosis of SARS-CoV-2 by FDA under an Emergency Use Authorization (EUA). This EUA will remain in effect (meaning this test can be used) for the duration of the COVID-19 declaration under Section 564(b)(1) of the Act, 21 U.S.C. section 360bbb-3(b)(1), unless the authorization is terminated or revoked.  Performed at [facility], [address] [place], [place] [address]   MRSA PCR Screening     Status: None   Collection Time: 11/25/20  5:39 AM   Specimen: Nasal Mucosa; Nasopharyngeal  Result Value Ref Range Status   MRSA by PCR NEGATIVE NEGATIVE Final    Comment:        The GeneXpert MRSA Assay (FDA approved for NASAL specimens only), is one component of a comprehensive MRSA colonization surveillance program. It is not intended to diagnose MRSA infection nor to guide or monitor treatment for MRSA infections. Performed at [facility], [place] [address] [place], [place] [address]   Culture, blood (routine x 2)     Status: None (Preliminary result)   Collection Time: 11/25/20  6:17 AM   Specimen: BLOOD  Result Value Ref Range Status   Specimen Description BLOOD RIGHT ANTECUBITAL  Final   Special Requests AEROBIC BOTTLE ONLY Blood Culture adequate volume  Final   Culture   Final    NO GROWTH 1 DAY Performed at [facility], [place] [address] [place], [place] [address]    Report Status PENDING  Incomplete  Culture, blood (routine x 2)     Status: None (Preliminary  result)   Collection Time: 11/25/20  6:32 AM   Specimen: BLOOD LEFT HAND  Result Value Ref Range Status   Specimen Description BLOOD LEFT HAND  Final   Special Requests AEROBIC BOTTLE ONLY Blood Culture adequate volume  Final   Culture   Final    NO GROWTH 1 DAY Performed at [facility], [place] [address] [place], [place] [address]    Report Status PENDING  Incomplete    Radiology Reports DG Chest Port 1 View  Result Date: 11/25/2020 CLINICAL DATA:  COVID-19. EXAM: PORTABLE CHEST 1 VIEW COMPARISON:  October 02, 2020 FINDINGS: No pneumothorax. The AICD device is stable. The cardiomediastinal silhouette is stable. The left lung is clear. Minimal opacity seen in the right base. No other acute abnormalities. IMPRESSION: 1. Minimal opacity in the right base could represent atelectasis or early infiltrate. Recommend attention on follow-up. No other acute abnormalities. Electronically Signed   By: Dorise Bullion III M.D   On: 11/25/2020 09:48   MM 3D SCREEN BREAST BILATERAL  Result Date: 11/09/2020 CLINICAL DATA:  Screening. EXAM: DIGITAL SCREENING BILATERAL MAMMOGRAM WITH TOMO AND CAD COMPARISON:  Previous exam(s). ACR Breast Density Category b: There are scattered areas of fibroglandular density. FINDINGS: There are no findings suspicious for malignancy. The images were evaluated with computer-aided detection. IMPRESSION: No mammographic evidence of malignancy. A result letter of this screening mammogram will be mailed directly to the patient. RECOMMENDATION: Screening mammogram in one year. (Code:SM-B-01Y) BI-RADS CATEGORY  1: Negative. Electronically Signed   By: Dorise Bullion III M.D   On: 11/09/2020 13:22

## 2020-11-26 NOTE — Plan of Care (Signed)
Patient is currently resting in bed. VSS. Remains on room air. No complaints overnight. L foot elevated. Call bell within reach.   Problem: Education: Goal: Knowledge of risk factors and measures for prevention of condition will improve Outcome: Progressing   Problem: Coping: Goal: Psychosocial and spiritual needs will be supported Outcome: Progressing   Problem: Respiratory: Goal: Will maintain a patent airway Outcome: Progressing Goal: Complications related to the disease process, condition or treatment will be avoided or minimized Outcome: Progressing

## 2020-11-27 ENCOUNTER — Inpatient Hospital Stay (HOSPITAL_COMMUNITY): Payer: PPO

## 2020-11-27 DIAGNOSIS — L97521 Non-pressure chronic ulcer of other part of left foot limited to breakdown of skin: Secondary | ICD-10-CM

## 2020-11-27 LAB — GLUCOSE, CAPILLARY
Glucose-Capillary: 86 mg/dL (ref 70–99)
Glucose-Capillary: 167 mg/dL — ABNORMAL HIGH (ref 70–99)
Glucose-Capillary: 112 mg/dL — ABNORMAL HIGH (ref 70–99)
Glucose-Capillary: 147 mg/dL — ABNORMAL HIGH (ref 70–99)

## 2020-11-27 LAB — COMPREHENSIVE METABOLIC PANEL
ALT: 20 U/L (ref 0–44)
AST: 18 U/L (ref 15–41)
Albumin: 2.8 g/dL — ABNORMAL LOW (ref 3.5–5.0)
Alkaline Phosphatase: 63 U/L (ref 38–126)
Anion gap: 9 (ref 5–15)
BUN: 53 mg/dL — ABNORMAL HIGH (ref 8–23)
CO2: 29 mmol/L (ref 22–32)
Calcium: 8.8 mg/dL — ABNORMAL LOW (ref 8.9–10.3)
Chloride: 101 mmol/L (ref 98–111)
Creatinine, Ser: 2.58 mg/dL — ABNORMAL HIGH (ref 0.44–1.00)
GFR, Estimated: 20 mL/min — ABNORMAL LOW (ref >60)
Glucose, Bld: 104 mg/dL — ABNORMAL HIGH (ref 70–99)
Potassium: 4.1 mmol/L (ref 3.5–5.1)
Sodium: 139 mmol/L (ref 135–145)
Total Bilirubin: 0.7 mg/dL (ref 0.3–1.2)
Total Protein: 6.4 g/dL — ABNORMAL LOW (ref 6.5–8.1)

## 2020-11-27 LAB — CBC WITH DIFFERENTIAL/PLATELET
Abs Immature Granulocytes: 0.02 10*3/uL (ref 0.00–0.07)
Basophils Absolute: 0 10*3/uL (ref 0.0–0.1)
Basophils Relative: 0 %
Eosinophils Absolute: 0.1 10*3/uL (ref 0.0–0.5)
Eosinophils Relative: 1 %
HCT: 34.3 % — ABNORMAL LOW (ref 36.0–46.0)
Hemoglobin: 11 g/dL — ABNORMAL LOW (ref 12.0–15.0)
Immature Granulocytes: 0 %
Lymphocytes Relative: 18 %
Lymphs Abs: 1.5 10*3/uL (ref 0.7–4.0)
MCH: 30.8 pg (ref 26.0–34.0)
MCHC: 32.1 g/dL (ref 30.0–36.0)
MCV: 96.1 fL (ref 80.0–100.0)
Monocytes Absolute: 0.7 10*3/uL (ref 0.1–1.0)
Monocytes Relative: 9 %
Neutro Abs: 6 10*3/uL (ref 1.7–7.7)
Neutrophils Relative %: 72 %
Platelets: 147 10*3/uL — ABNORMAL LOW (ref 150–400)
RBC: 3.57 MIL/uL — ABNORMAL LOW (ref 3.87–5.11)
RDW: 13.2 % (ref 11.5–15.5)
WBC: 8.3 10*3/uL (ref 4.0–10.5)
nRBC: 0 % (ref 0.0–0.2)

## 2020-11-27 LAB — BRAIN NATRIURETIC PEPTIDE: B Natriuretic Peptide: 101.3 pg/mL — ABNORMAL HIGH (ref 0.0–100.0)

## 2020-11-27 LAB — MAGNESIUM: Magnesium: 2.4 mg/dL (ref 1.7–2.4)

## 2020-11-27 LAB — D-DIMER, QUANTITATIVE: D-Dimer, Quant: 1.27 ug/mL-FEU — ABNORMAL HIGH (ref 0.00–0.50)

## 2020-11-27 LAB — C-REACTIVE PROTEIN: CRP: 10.5 mg/dL — ABNORMAL HIGH (ref <1.0)

## 2020-11-27 LAB — PROCALCITONIN: Procalcitonin: 0.13 ng/mL

## 2020-11-27 MED ORDER — TORSEMIDE 20 MG PO TABS
20.0000 mg | ORAL_TABLET | Freq: Two times a day (BID) | ORAL | Status: DC
  Administered 2020-11-27 – 2020-11-30 (×7): 20 mg via ORAL
  Filled 2020-11-27 (×7): qty 1

## 2020-11-27 NOTE — Plan of Care (Signed)
Patient is currently resting in bed. Denies pain. VSS. Remains on room air. CPAP overnight. Plan for MRI today. Call bell within reach.   Problem: Education: Goal: Knowledge of risk factors and measures for prevention of condition will improve Outcome: Progressing   Problem: Coping: Goal: Psychosocial and spiritual needs will be supported Outcome: Progressing   Problem: Respiratory: Goal: Will maintain a patent airway Outcome: Progressing Goal: Complications related to the disease process, condition or treatment will be avoided or minimized Outcome: Progressing

## 2020-11-27 NOTE — Progress Notes (Signed)
PROGRESS NOTE                                                                                                                                                                                                             Patient Demographics:    [name], is a 65 y.o. female, DOB - 21-May-1956, NOI:[id number]  Outpatient Primary MD for the patient is [name], MD    LOS - 2  Admit date - 11/25/2020    No chief complaint on file.      Brief Narrative (HPI from H&P) - 65 y.o. Caucasian female with medical history significant for coronary artery disease, hypertension, obstructive sleep apnea, type 2 diabetes mellitus, systolic CHF status post AICD due to episodes of V. tach in the past and previous history of osteomyelitis of the ankle and foot and depression, who presented to the emergency room with acute onset of worsening left foot swelling with erythema for the last month with left foot wound which has been followed by podiatry, on initial x-rays there was evidence of some subcutaneous air in the left foot and she was transferred from [facility] to [facility] for AICD compatible MRI.   Subjective:   Patient in bed, appears comfortable, denies any headache, no fever, no chest pain or pressure, no shortness of breath , no abdominal pain. No focal weakness.   Assessment  & Plan :     1.  Left big toe and foot cellulitis acute on chronic.  Being followed outpatient with podiatry, MRI will be tried if AICD is compatible on 11/27/2020, Dr. Amalia Hailey following, continue empiric antibiotic, currently not septic.  2.  CAD with history of chronic diastolic CHF.  Last echo few months ago shows EF of 55%, has AICD. She follows with Dr. Caryl Comes for her cardiology issues, currently symptom-free, EKG is paced, no chest symptoms currently, continue combination of amiodarone, Coreg, Plavix, Imdur, ARB, statin for secondary prevention.  Note home  dose diuretics have been reduced adjusted on 11/27/20.  Note patient is on amiodarone and has AICD for history of V. tach in the past.  Note does not have confirmed paroxysmal A. fib in the chart.  3. Incidental COVID-19 infection and fully vaccinated and boosted patient.  Supportive care. Encouraged the patient to sit up in chair in the daytime use I-S and  flutter valve for pulmonary toiletry.   4.  Morbid obesity BMI 40.  Follow with PCP for weight loss.  5.  OSA.  CPAP nightly.  6.  Dyslipidemia.  On statin.  7.  CKD4.  Baseline creatinine around 2 to 2.5.  Monitor.  8.  DM type II.  On Lantus plus sliding scale for now.  Lab Results  Component Value Date   HGBA1C 8.1 (H) 11/25/2020    CBG (last 3)  Recent Labs    11/26/20 1701 11/26/20 2036 11/27/20 0748  GLUCAP 172* 166* 86     SpO2: 99 %  Recent Labs  Lab 11/24/20 1752 11/24/20 2007 11/24/20 2040 11/25/20 0554 11/26/20 0130 11/26/20 0133 11/27/20 0026  WBC 12.1*  --   --  9.1 7.9  --  8.3  HGB 12.5  --   --  12.4 11.4*  --  11.0*  HCT 38.7  --   --  39.0 34.6*  --  34.3*  PLT 158  --   --  138* 137*  --  147*  CRP  --  2.7*  --  7.6* 12.7*  --  10.5*  BNP  --   --   --  304.7*  --  92.8 101.3*  DDIMER  --   --   --  1.54* 1.22*  --  1.27*  PROCALCITON  --   --   --  0.17 0.21  --  0.13  AST 30  --   --  22 22  --  18  ALT 28  --   --  24 23  --  20  ALKPHOS 88  --   --  70 67  --  63  BILITOT 0.7  --   --  0.8 0.7  --  0.7  ALBUMIN 4.1  --   --  3.4* 3.0*  --  2.8*  SARSCOV2NAA  --   --  POSITIVE*  --   --   --   --          Condition - Extremely Guarded  Family Communication  : Daughter [name] [phone] on 11/25/2020 message left at 9:33 AM.  Code Status :  Full  Consults  :  Podiatry Dr Amalia Hailey  PUD Prophylaxis :    Procedures  :     MRI -      Disposition Plan  :    Status is: Inpatient  Remains inpatient appropriate because:IV treatments appropriate due to intensity of illness or  inability to take PO   Dispo: The patient is from: Home              Anticipated d/c is to: Home              Anticipated d/c date is: > 3 days              Patient currently is not medically stable to d/c.   Difficult to place patient No  DVT Prophylaxis  :  Lovenox    Lab Results  Component Value Date   PLT 147 (L) 11/27/2020    Diet :  Diet Order            Diet heart healthy/carb modified Room service appropriate? Yes; Fluid consistency: Thin  Diet effective now                  Inpatient Medications  Scheduled Meds: . amiodarone  200 mg Oral Daily  . vitamin C  500  mg Oral Daily  . calcium-vitamin D  1 tablet Oral BID WC  . carvedilol  3.125 mg Oral Q breakfast  . chlorhexidine  15 mL Mouth Rinse BID  . cholecalciferol  1,000 Units Oral Daily  . clopidogrel  75 mg Oral Daily  . DULoxetine  30 mg Oral QHS  . empagliflozin  10 mg Oral QAC breakfast  . enoxaparin (LOVENOX) injection  60 mg Subcutaneous Q24H  . ferrous sulfate  325 mg Oral BID WC  . gabapentin  1,200 mg Oral QHS  . gabapentin  600 mg Oral Daily  . insulin aspart  0-20 Units Subcutaneous TID WC  . insulin aspart  0-5 Units Subcutaneous QHS  . insulin aspart  3 Units Subcutaneous TID WC  . insulin glargine  30 Units Subcutaneous Daily  . isosorbide mononitrate  30 mg Oral Daily  . levothyroxine  50 mcg Oral Q0600  . mouth rinse  15 mL Mouth Rinse q12n4p  . multivitamin with minerals  1 tablet Oral Daily  . omega-3 acid ethyl esters  1 g Oral Daily  . rosuvastatin  10 mg Oral Daily  . torsemide  20 mg Oral BID  . zinc sulfate  220 mg Oral Daily   Continuous Infusions: . piperacillin-tazobactam (ZOSYN)  IV 3.375 g (11/27/20 0416)  . remdesivir 100 mg in NS 100 mL 100 mg (11/27/20 0907)  . vancomycin 1,500 mg (11/27/20 0045)   PRN Meds:.acetaminophen **OR** [DISCONTINUED] acetaminophen, albuterol, chlorpheniramine-HYDROcodone, guaiFENesin-dextromethorphan, magnesium hydroxide, morphine  injection, nitroGLYCERIN, [DISCONTINUED] ondansetron **OR** ondansetron (ZOFRAN) IV, traMADol, traZODone  Antibiotics  :    Anti-infectives (From admission, onward)   Start     Dose/Rate Route Frequency Ordered Stop   11/27/20 0000  vancomycin (VANCOREADY) IVPB 1500 mg/300 mL        1,500 mg 150 mL/hr over 120 Minutes Intravenous Every 48 hours 11/25/20 0607     11/26/20 1000  remdesivir 100 mg in sodium chloride 0.9 % 100 mL IVPB       "Followed by" Linked Group Details   100 mg 200 mL/hr over 30 Minutes Intravenous Daily 11/25/20 0550 11/30/20 0959   11/25/20 1200  piperacillin-tazobactam (ZOSYN) IVPB 3.375 g        3.375 g 12.5 mL/hr over 240 Minutes Intravenous Every 8 hours 11/25/20 0555     11/25/20 0800  remdesivir 200 mg in sodium chloride 0.9% 250 mL IVPB       "Followed by" Linked Group Details   200 mg 580 mL/hr over 30 Minutes Intravenous Once 11/25/20 0550 11/25/20 0925   11/25/20 0615  vancomycin (VANCOREADY) IVPB 1000 mg/200 mL  Status:  Discontinued        1,000 mg 200 mL/hr over 60 Minutes Intravenous  Once 11/25/20 0520 11/25/20 0530   11/25/20 0600  piperacillin-tazobactam (ZOSYN) IVPB 3.375 g        3.375 g 100 mL/hr over 30 Minutes Intravenous  Once 11/25/20 0520 11/25/20 0855       Time Spent in minutes  30   Lala Lund M.D on 11/27/2020 at 10:12 AM  To page go to [email or web address]   [facility] -  Office  [phone]   See all Orders from today for further details    Objective:   Vitals:   11/26/20 0548 11/26/20 1600 11/26/20 2101 11/27/20 0509  BP: (!) 115/56 124/68 134/73 140/75  Pulse: 65 63 66 65  Resp: 15 18 17 18   Temp: 98.3 F (36.8 C) 98.8 F (37.1 C) 98.7  F (37.1 C) 98.8 F (37.1 C)  TempSrc: Axillary Oral Axillary Axillary  SpO2: 97%  97% 99%  Weight:      Height:        Wt Readings from Last 3 Encounters:  11/25/20 121.8 kg  11/24/20 117.9 kg  10/16/20 120.7 kg     Intake/Output Summary (Last 24 hours) at  11/27/2020 1012 Last data filed at 11/27/2020 0915 Gross per 24 hour  Intake 650 ml  Output 1900 ml  Net -1250 ml     Physical Exam  Awake Alert, No new F.N deficits, Normal affect [place].AT,PERRAL Supple Neck,No JVD, No cervical lymphadenopathy appriciated.  Symmetrical Chest wall movement, Good air movement bilaterally, CTAB RRR,No Gallops, Rubs or new Murmurs, No Parasternal Heave +ve B.Sounds, Abd Soft, No tenderness, No organomegaly appriciated, No rebound - guarding or rigidity. L Big toe and forefoot cellulitis, amputated left and right second toe    Data Review:    CBC Recent Labs  Lab 11/24/20 1752 11/25/20 0554 11/26/20 0130 11/27/20 0026  WBC 12.1* 9.1 7.9 8.3  HGB 12.5 12.4 11.4* 11.0*  HCT 38.7 39.0 34.6* 34.3*  PLT 158 138* 137* 147*  MCV 95.8 97.7 95.8 96.1  MCH 30.9 31.1 31.6 30.8  MCHC 32.3 31.8 32.9 32.1  RDW 13.2 13.2 13.3 13.2  LYMPHSABS 1.4 1.5 1.2 1.5  MONOABS 0.9 0.7 0.6 0.7  EOSABS 0.0 0.0 0.1 0.1  BASOSABS 0.0 0.0 0.0 0.0    Recent Labs  Lab 11/24/20 1752 11/24/20 2007 11/25/20 0554 11/26/20 0130 11/26/20 0133 11/27/20 0026  NA 134*  --  138 137  --  139  K 4.0  --  3.8 4.2  --  4.1  CL 93*  --  98 99  --  101  CO2 29  --  26 28  --  29  GLUCOSE 262*  --  213* 157*  --  104*  BUN 65*  --  60* 56*  --  53*  CREATININE 2.20*  --  2.12* 2.27*  --  2.58*  CALCIUM 9.0  --  8.8* 9.1  --  8.8*  AST 30  --  22 22  --  18  ALT 28  --  24 23  --  20  ALKPHOS 88  --  70 67  --  63  BILITOT 0.7  --  0.8 0.7  --  0.7  ALBUMIN 4.1  --  3.4* 3.0*  --  2.8*  MG  --   --  2.3 2.6*  --  2.4  CRP  --  2.7* 7.6* 12.7*  --  10.5*  DDIMER  --   --  1.54* 1.22*  --  1.27*  PROCALCITON  --   --  0.17 0.21  --  0.13  HGBA1C  --   --  8.1*  --   --   --   BNP  --   --  304.7*  --  92.8 101.3*    ------------------------------------------------------------------------------------------------------------------ No results for input(s): CHOL, HDL,  LDLCALC, TRIG, CHOLHDL, LDLDIRECT in the last 72 hours.  Lab Results  Component Value Date   HGBA1C 8.1 (H) 11/25/2020   ------------------------------------------------------------------------------------------------------------------ No results for input(s): TSH, T4TOTAL, T3FREE, THYROIDAB in the last 72 hours.  Invalid input(s): FREET3  Cardiac Enzymes No results for input(s): CKMB, TROPONINI, MYOGLOBIN in the last 168 hours.  Invalid input(s): CK ------------------------------------------------------------------------------------------------------------------    Component Value Date/Time   BNP 101.3 (H) 11/27/2020 0026    Micro  Results Recent Results (from the past 240 hour(s))  Aerobic Culture w Gram Stain (superficial specimen)     Status: None (Preliminary result)   Collection Time: 11/24/20  8:01 PM   Specimen: Foot; Wound  Result Value Ref Range Status   Specimen Description   Final    FOOT Performed at [facility], [address] [place], [place] [address]    Special Requests   Final    Normal Performed at [facility], [place], [place] [address]    Gram Stain   Final    NO WBC SEEN ABUNDANT GRAM POSITIVE COCCI RARE GRAM NEGATIVE RODS    Culture   Final    FEW PROTEUS MIRABILIS SUSCEPTIBILITIES TO FOLLOW Performed at [facility], [place] [address], [place], [place] [address]    Report Status PENDING  Incomplete  Blood culture (routine x 2)     Status: None (Preliminary result)   Collection Time: 11/24/20  8:07 PM   Specimen: BLOOD  Result Value Ref Range Status   Specimen Description BLOOD RIGHT ANTECUBITAL  Final   Special Requests   Final    BOTTLES DRAWN AEROBIC AND ANAEROBIC Blood Culture results may not be optimal due to an inadequate volume of blood received in culture bottles   Culture   Final    NO GROWTH 3 DAYS Performed at [facility], [address], [place], [place] [address]    Report  Status PENDING  Incomplete  Blood culture (routine x 2)     Status: None (Preliminary result)   Collection Time: 11/24/20  8:07 PM   Specimen: BLOOD  Result Value Ref Range Status   Specimen Description BLOOD LEFT ANTECUBITAL  Final   Special Requests   Final    BOTTLES DRAWN AEROBIC AND ANAEROBIC Blood Culture results may not be optimal due to an inadequate volume of blood received in culture bottles   Culture   Final    NO GROWTH 3 DAYS Performed at [facility], [address], [place], [place] [address]    Report Status PENDING  Incomplete  Resp Panel by RT-PCR (Flu A&B, Covid) Nasopharyngeal Swab     Status: Abnormal   Collection Time: 11/24/20  8:40 PM   Specimen: Nasopharyngeal Swab; Nasopharyngeal(NP) swabs in vial transport medium  Result Value Ref Range Status   SARS Coronavirus 2 by RT PCR POSITIVE (A) NEGATIVE Final    Comment: RESULT CALLED TO, READ BACK BY AND VERIFIED WITH: Mitchell RN 11/24/2020 2144 JG (NOTE) SARS-CoV-2 target nucleic acids are DETECTED.  The SARS-CoV-2 RNA is generally detectable in upper respiratory specimens during the acute phase of infection. Positive results are indicative of the presence of the identified virus, but do not rule out bacterial infection or co-infection with other pathogens not detected by the test. Clinical correlation with patient history and other diagnostic information is necessary to determine patient infection status. The expected result is Negative.  Fact Sheet for Patients: EntrepreneurPulse.com.au  Fact Sheet for Healthcare Providers: IncredibleEmployment.be  This test is not yet approved or cleared by the [place] FDA and  has been authorized for detection and/or diagnosis of SARS-CoV-2 by FDA under an Emergency Use Authorization (EUA).  This EUA will remain in effect (meaning this test can  be used) for the duration of  the COVID-19 declaration under  Section 564(b)(1) of the Act, 21 U.S.C. section 360bbb-3(b)(1), unless the authorization is terminated or revoked sooner.  Influenza A by PCR NEGATIVE NEGATIVE Final   Influenza B by PCR NEGATIVE NEGATIVE Final    Comment: (NOTE) The Xpert Xpress SARS-CoV-2/FLU/RSV plus assay is intended as an aid in the diagnosis of influenza from Nasopharyngeal swab specimens and should not be used as a sole basis for treatment. Nasal washings and aspirates are unacceptable for Xpert Xpress SARS-CoV-2/FLU/RSV testing.  Fact Sheet for Patients: EntrepreneurPulse.com.au  Fact Sheet for Healthcare Providers: IncredibleEmployment.be  This test is not yet approved or cleared by the [place] FDA and has been authorized for detection and/or diagnosis of SARS-CoV-2 by FDA under an Emergency Use Authorization (EUA). This EUA will remain in effect (meaning this test can be used) for the duration of the COVID-19 declaration under Section 564(b)(1) of the Act, 21 U.S.C. section 360bbb-3(b)(1), unless the authorization is terminated or revoked.  Performed at [facility], [address] [place], [place] [address]   MRSA PCR Screening     Status: None   Collection Time: 11/25/20  5:39 AM   Specimen: Nasal Mucosa; Nasopharyngeal  Result Value Ref Range Status   MRSA by PCR NEGATIVE NEGATIVE Final    Comment:        The GeneXpert MRSA Assay (FDA approved for NASAL specimens only), is one component of a comprehensive MRSA colonization surveillance program. It is not intended to diagnose MRSA infection nor to guide or monitor treatment for MRSA infections. Performed at [facility], [place] [address], [place], [place] [address]   Culture, blood (routine x 2)     Status: None (Preliminary result)   Collection Time: 11/25/20  6:17 AM   Specimen: BLOOD  Result Value Ref Range Status   Specimen Description BLOOD RIGHT ANTECUBITAL  Final    Special Requests AEROBIC BOTTLE ONLY Blood Culture adequate volume  Final   Culture   Final    NO GROWTH 2 DAYS Performed at [facility], [place] [address], [place], [place] [address]    Report Status PENDING  Incomplete  Culture, blood (routine x 2)     Status: None (Preliminary result)   Collection Time: 11/25/20  6:32 AM   Specimen: BLOOD LEFT HAND  Result Value Ref Range Status   Specimen Description BLOOD LEFT HAND  Final   Special Requests AEROBIC BOTTLE ONLY Blood Culture adequate volume  Final   Culture   Final    NO GROWTH 2 DAYS Performed at [facility], [place] [address] [place], [place] [address]    Report Status PENDING  Incomplete    Radiology Reports DG Chest Port 1 View  Result Date: 11/27/2020 CLINICAL DATA:  Shortness of breath, COVID-19 EXAM: PORTABLE CHEST 1 VIEW COMPARISON:  Portable exam 0637 hours compared to 11/25/2020 FINDINGS: LEFT subclavian ICD leads project over RIGHT atrium, RIGHT ventricle, and coronary sinus. Normal heart size, mediastinal contours, and pulmonary vascularity. Atherosclerotic calcification aorta. Minimal patchy infiltrate in RIGHT upper lobe above minor fissure and at RIGHT base. LEFT lung clear. No pleural effusion or pneumothorax. IMPRESSION: Minimal patchy RIGHT lung infiltrates consistent with multifocal pneumonia. Electronically Signed   By: Lavonia Dana M.D.   On: 11/27/2020 08:15   DG Chest Port 1 View  Result Date: 11/25/2020 CLINICAL DATA:  COVID-19. EXAM: PORTABLE CHEST 1 VIEW COMPARISON:  October 02, 2020 FINDINGS: No pneumothorax. The AICD device is stable. The cardiomediastinal silhouette is stable. The left lung is clear. Minimal opacity seen in the right base. No other acute abnormalities. IMPRESSION: 1. Minimal  opacity in the right base could represent atelectasis or early infiltrate. Recommend attention on follow-up. No other acute abnormalities. Electronically Signed   By: Dorise Bullion III M.D   On: 11/25/2020  09:48   MM 3D SCREEN BREAST BILATERAL  Result Date: 11/09/2020 CLINICAL DATA:  Screening. EXAM: DIGITAL SCREENING BILATERAL MAMMOGRAM WITH TOMO AND CAD COMPARISON:  Previous exam(s). ACR Breast Density Category b: There are scattered areas of fibroglandular density. FINDINGS: There are no findings suspicious for malignancy. The images were evaluated with computer-aided detection. IMPRESSION: No mammographic evidence of malignancy. A result letter of this screening mammogram will be mailed directly to the patient. RECOMMENDATION: Screening mammogram in one year. (Code:SM-B-01Y) BI-RADS CATEGORY  1: Negative. Electronically Signed   By: Dorise Bullion III M.D   On: 11/09/2020 13:22

## 2020-11-27 NOTE — Progress Notes (Signed)
Per order, changed device settings for MRI to DOO at 80 bpm    Tachy-therapies to off if applicable.    Will program device back to pre-MRI settings after completion of exam.  During scan patient's bedside nurse is staying to monitor patient. Nothing further needed at this time.

## 2020-11-27 NOTE — Progress Notes (Signed)
Informed of MRI for today.   Device system confirmed to be MRI conditional, with implant date > 6 weeks ago and no evidence of abandoned or epicardial leads in review of most recent CXR Interrogation from today reviewed, pt is currently AS-VP at 60 bpm Change device settings for MRI to DOO at 80 bpm  Tachy-therapies to off if applicable.  Program device back to pre-MRI settings after completion of exam.  Shirley Friar, PA-C  11/27/2020 11:01 AM

## 2020-11-27 NOTE — Progress Notes (Signed)
Subjective: 64y.o.Caucasian femalewith medical history significant forcoronary artery disease, hypertension, obstructive sleep apnea, type 2 diabetes mellitus, systolic CHF status post AICDdue to episodes of V. tach in the pastand previous history of osteomyelitis of the ankle and foot and depression, who presented to the emergency room with acute onset of worsening left foot swelling with erythema for the last month with left foot wound which has been followed by podiatry,on initial x-rays there was evidence of some subcutaneous air in the left foot and she was transferred from [facility] to [facility] for AICD compatible MRI.  She had an MRI performed today.  She states that she has not seen significant provement in the redness.  However she does deny any fevers, chills, nausea, vomiting.     Objective: AAO x3, NAD DP/PT pulses palpable bilaterally, CRT less than 3 seconds Ulceration plantar sub-MPJ approximate 1 cm in diameter.  Minimal serous drainage expressed with there is no purulence there is no malodor.  There is no probing.  There is still edema and erythema mostly to the first MPJ extending dorsally on the first interspace on the area the previous second amputation to approximately mid level of the first metatarsal. No pain with calf compression, swelling, warmth, erythema  Assessment: Cellulitis and concern for abscess left foot  Plan: -I reviewed the MRI.  There is a small abscess noted.  This is been a chronic issue and has been draining.  Given her history as well as continued erythema despite IV antibiotics I have discussed with her conservative versus surgical treatment options.  At this time wants to proceed with surgical intervention I discussed with her incision and drainage, wound excision and debridement.  I discussed risks of surgery including, but not limited to, spread of infection, amputation, osteomyelitis as well as general risks of surgery including stroke, heart attack,  death.  She understands this and she wished to proceed with surgery. -We will plan for left foot incision and drainage, wound debridement, excision tomorrow evening.  N.p.o. after 8:30 AM.  Consent to be signed. -Offered to call family member however she declined.  Podiatry will continue to follow.  Celesta Gentile, DPM

## 2020-11-27 NOTE — Progress Notes (Signed)
Patient placed on CPAP via nasal lmask on a pressure of 12 cmH2O per chart review of home settings.  Patient is tolerating the setup at this time.  RN aware.

## 2020-11-27 NOTE — Progress Notes (Signed)
Physical Therapy Treatment Patient Details Name: [name] MRN: [id number] DOB: 1956/05/19 Today's Date: 11/27/2020    History of Present Illness Pt is a 65 y.o. female admitted 11/25/20 with worsening L foot swelling; MRI 2/21 with L foot cellulitis, small fluid collection at first MTP joint most consistent for osteomyelitis. Incidental (+) COVID-19. PMH includes L ankle/foot osteomyelitis, vtach s/p ACID, HTN, CAD, OSA, DM2, CHF, depression.   PT Comments    Pt progressing well with mobility, curious to know L foot MRI results (results not available during time of session). Pt mod indep with hallway ambulation using SPC. Today's session focused on balance training without UE support. Pt motivated to participate and regain PLOF. Continue to recommend outpatient PT for higher level balance training.    Follow Up Recommendations  Outpatient PT (balance/vestibular)     Equipment Recommendations  None recommended by PT    Recommendations for Other Services       Precautions / Restrictions Precautions Precautions: Fall Required Braces or Orthoses: Other Brace Other Brace: L foot cam walker boot Restrictions Other Position/Activity Restrictions: pt reports wearing cam walking boot x 2 weeks    Mobility  Bed Mobility               General bed mobility comments: received sitting in recliner    Transfers Overall transfer level: Independent Equipment used: None             General transfer comment: Indep sit<>stand with and without DME  Ambulation/Gait Ambulation/Gait assistance: Modified independent (Device/Increase time) Gait Distance (Feet): 340 Feet Assistive device: Straight cane Gait Pattern/deviations: Step-through pattern;Decreased stride length;Decreased weight shift to left   Gait velocity interpretation: 1.31 - 2.62 ft/sec, indicative of limited community ambulator General Gait Details: Pt mod indep ambulating with SPC with R shoe and L cam walker  boot donned   Stairs             Wheelchair Mobility    Modified Rankin (Stroke Patients Only)       Balance Overall balance assessment: Needs assistance   Sitting balance-Leahy Scale: Good     Standing balance support: During functional activity;No upper extremity supported Standing balance-Leahy Scale: Fair                 High Level Balance Comments: 'Feet together' (still with gap due to BLE mass) improved stability with eyes open and closed, able to maintain at least 20-sec; semi-tandem able to maintain with eyes open and either foot in front, unable to maintain with eyes closed; unable to maintain full tandem standing without UE support -- educ on postural reactions/balance strategies (ankle, hip, and stepping reaction) and decreased bilateral foot/ankle proprioception due to decreased sensation. Pt demonstrates adequate ankle strategy this session, as well as stepping strategy once educated that's an option to recover LOB            Cognition Arousal/Alertness: Awake/alert Behavior During Therapy: WFL for tasks assessed/performed Overall Cognitive Status: Within Functional Limits for tasks assessed                                        Exercises      General Comments General comments (skin integrity, edema, etc.): Educ on fall risk reduction strategies for home, including light on in bathroom for nightly trips, keeping SPC right next to bed      Pertinent Vitals/Pain Pain Assessment:  No/denies pain Pain Intervention(s): Monitored during session    Home Living                      Prior Function            PT Goals (current goals can now be found in the care plan section) Progress towards PT goals: Progressing toward goals    Frequency    Min 3X/week      PT Plan Current plan remains appropriate    Co-evaluation              AM-PAC PT "6 Clicks" Mobility   Outcome Measure  Help needed turning from  your back to your side while in a flat bed without using bedrails?: None Help needed moving from lying on your back to sitting on the side of a flat bed without using bedrails?: None Help needed moving to and from a bed to a chair (including a wheelchair)?: None Help needed standing up from a chair using your arms (e.g., wheelchair or bedside chair)?: None Help needed to walk in hospital room?: None Help needed climbing 3-5 steps with a railing? : A Little 6 Click Score: 23    End of Session   Activity Tolerance: Patient tolerated treatment well Patient left: in chair;with call bell/phone within reach Nurse Communication: Mobility status PT Visit Diagnosis: Unsteadiness on feet (R26.81);Other abnormalities of gait and mobility (R26.89)     Time: 1350-1410 PT Time Calculation (min) (ACUTE ONLY): 20 min  Charges:  $Neuromuscular Re-education: 8-22 mins                     Mabeline Caras, PT, DPT Acute Rehabilitation Services  Pager [phone] Office [place] 11/27/2020, 4:23 PM

## 2020-11-28 ENCOUNTER — Inpatient Hospital Stay (HOSPITAL_COMMUNITY): Payer: PPO | Admitting: Certified Registered Nurse Anesthetist

## 2020-11-28 ENCOUNTER — Encounter (HOSPITAL_COMMUNITY)
Admission: RE | Disposition: A | Discharge status: Home Health | Payer: Self-pay | Source: Other Acute Inpatient Hospital | Attending: Internal Medicine

## 2020-11-28 ENCOUNTER — Inpatient Hospital Stay (HOSPITAL_COMMUNITY): Payer: PPO

## 2020-11-28 DIAGNOSIS — L97521 Non-pressure chronic ulcer of other part of left foot limited to breakdown of skin: Secondary | ICD-10-CM

## 2020-11-28 DIAGNOSIS — R7989 Other specified abnormal findings of blood chemistry: Secondary | ICD-10-CM

## 2020-11-28 DIAGNOSIS — U071 COVID-19: Secondary | ICD-10-CM

## 2020-11-28 HISTORY — PX: I & D EXTREMITY: SHX5045

## 2020-11-28 LAB — CBC WITH DIFFERENTIAL/PLATELET
Abs Immature Granulocytes: 0.01 10*3/uL (ref 0.00–0.07)
Basophils Absolute: 0 10*3/uL (ref 0.0–0.1)
Basophils Relative: 0 %
Eosinophils Absolute: 0.1 10*3/uL (ref 0.0–0.5)
Eosinophils Relative: 1 %
HCT: 33.6 % — ABNORMAL LOW (ref 36.0–46.0)
Hemoglobin: 11.4 g/dL — ABNORMAL LOW (ref 12.0–15.0)
Immature Granulocytes: 0 %
Lymphocytes Relative: 22 %
Lymphs Abs: 1.4 10*3/uL (ref 0.7–4.0)
MCH: 32 pg (ref 26.0–34.0)
MCHC: 33.9 g/dL (ref 30.0–36.0)
MCV: 94.4 fL (ref 80.0–100.0)
Monocytes Absolute: 0.7 10*3/uL (ref 0.1–1.0)
Monocytes Relative: 11 %
Neutro Abs: 4.1 10*3/uL (ref 1.7–7.7)
Neutrophils Relative %: 66 %
Platelets: 157 10*3/uL (ref 150–400)
RBC: 3.56 MIL/uL — ABNORMAL LOW (ref 3.87–5.11)
RDW: 13.2 % (ref 11.5–15.5)
WBC: 6.3 10*3/uL (ref 4.0–10.5)
nRBC: 0 % (ref 0.0–0.2)

## 2020-11-28 LAB — TYPE AND SCREEN
ABO/RH(D): O POS
Antibody Screen: NEGATIVE

## 2020-11-28 LAB — COMPREHENSIVE METABOLIC PANEL
ALT: 21 U/L (ref 0–44)
AST: 18 U/L (ref 15–41)
Albumin: 3 g/dL — ABNORMAL LOW (ref 3.5–5.0)
Alkaline Phosphatase: 60 U/L (ref 38–126)
Anion gap: 12 (ref 5–15)
BUN: 48 mg/dL — ABNORMAL HIGH (ref 8–23)
CO2: 27 mmol/L (ref 22–32)
Calcium: 9 mg/dL (ref 8.9–10.3)
Chloride: 101 mmol/L (ref 98–111)
Creatinine, Ser: 2.22 mg/dL — ABNORMAL HIGH (ref 0.44–1.00)
GFR, Estimated: 24 mL/min — ABNORMAL LOW (ref >60)
Glucose, Bld: 127 mg/dL — ABNORMAL HIGH (ref 70–99)
Potassium: 3.7 mmol/L (ref 3.5–5.1)
Sodium: 140 mmol/L (ref 135–145)
Total Bilirubin: 0.7 mg/dL (ref 0.3–1.2)
Total Protein: 6.8 g/dL (ref 6.5–8.1)

## 2020-11-28 LAB — GLUCOSE, CAPILLARY
Glucose-Capillary: 105 mg/dL — ABNORMAL HIGH (ref 70–99)
Glucose-Capillary: 182 mg/dL — ABNORMAL HIGH (ref 70–99)
Glucose-Capillary: 102 mg/dL — ABNORMAL HIGH (ref 70–99)
Glucose-Capillary: 111 mg/dL — ABNORMAL HIGH (ref 70–99)
Glucose-Capillary: 86 mg/dL (ref 70–99)

## 2020-11-28 LAB — PROTIME-INR
INR: 1.2 (ref 0.8–1.2)
Prothrombin Time: 14.6 seconds (ref 11.4–15.2)

## 2020-11-28 LAB — PROCALCITONIN: Procalcitonin: 0.1 ng/mL

## 2020-11-28 LAB — D-DIMER, QUANTITATIVE: D-Dimer, Quant: 1.72 ug/mL-FEU — ABNORMAL HIGH (ref 0.00–0.50)

## 2020-11-28 LAB — MAGNESIUM: Magnesium: 2.2 mg/dL (ref 1.7–2.4)

## 2020-11-28 LAB — C-REACTIVE PROTEIN: CRP: 9.5 mg/dL — ABNORMAL HIGH (ref <1.0)

## 2020-11-28 LAB — BRAIN NATRIURETIC PEPTIDE: B Natriuretic Peptide: 164 pg/mL — ABNORMAL HIGH (ref 0.0–100.0)

## 2020-11-28 SURGERY — IRRIGATION AND DEBRIDEMENT EXTREMITY
Anesthesia: General | Site: Foot | Laterality: Left

## 2020-11-28 MED ORDER — BUPIVACAINE HCL (PF) 0.5 % IJ SOLN
INTRAMUSCULAR | Status: AC
  Filled 2020-11-28: qty 30

## 2020-11-28 MED ORDER — BUPIVACAINE HCL (PF) 0.5 % IJ SOLN
INTRAMUSCULAR | Status: DC | PRN
  Administered 2020-11-28: 15 mL

## 2020-11-28 MED ORDER — FENTANYL CITRATE (PF) 250 MCG/5ML IJ SOLN
INTRAMUSCULAR | Status: AC
  Filled 2020-11-28: qty 5

## 2020-11-28 MED ORDER — DEXAMETHASONE SODIUM PHOSPHATE 10 MG/ML IJ SOLN
INTRAMUSCULAR | Status: DC | PRN
  Administered 2020-11-28: 5 mg via INTRAVENOUS

## 2020-11-28 MED ORDER — FENTANYL CITRATE (PF) 100 MCG/2ML IJ SOLN: 25.0000 ug | INTRAMUSCULAR | Status: DC | PRN

## 2020-11-28 MED ORDER — FENTANYL CITRATE (PF) 250 MCG/5ML IJ SOLN
INTRAMUSCULAR | Status: DC | PRN
  Administered 2020-11-28: 50 ug via INTRAVENOUS

## 2020-11-28 MED ORDER — LIDOCAINE 2% (20 MG/ML) 5 ML SYRINGE
INTRAMUSCULAR | Status: DC | PRN
  Administered 2020-11-28: 100 mg via INTRAVENOUS

## 2020-11-28 MED ORDER — EPHEDRINE SULFATE 50 MG/ML IJ SOLN
INTRAMUSCULAR | Status: DC | PRN
  Administered 2020-11-28: 5 mg via INTRAVENOUS
  Administered 2020-11-28: 2.5 mg via INTRAVENOUS

## 2020-11-28 MED ORDER — 0.9 % SODIUM CHLORIDE (POUR BTL) OPTIME
TOPICAL | Status: DC | PRN
  Administered 2020-11-28: 1000 mL

## 2020-11-28 MED ORDER — PHENYLEPHRINE HCL (PRESSORS) 10 MG/ML IV SOLN
INTRAVENOUS | Status: DC | PRN
  Administered 2020-11-28: 80 ug via INTRAVENOUS

## 2020-11-28 MED ORDER — INSULIN ASPART 100 UNIT/ML ~~LOC~~ SOLN
0.0000 [IU] | Freq: Four times a day (QID) | SUBCUTANEOUS | Status: DC
  Administered 2020-11-28 – 2020-11-29 (×2): 4 [IU] via SUBCUTANEOUS
  Administered 2020-11-29: 7 [IU] via SUBCUTANEOUS
  Administered 2020-11-29: 11 [IU] via SUBCUTANEOUS
  Administered 2020-11-30: 7 [IU] via SUBCUTANEOUS

## 2020-11-28 MED ORDER — MIDAZOLAM HCL 2 MG/2ML IJ SOLN
INTRAMUSCULAR | Status: AC
  Filled 2020-11-28: qty 2

## 2020-11-28 MED ORDER — LACTATED RINGERS IV SOLN: INTRAVENOUS | Status: DC | PRN

## 2020-11-28 MED ORDER — PROPOFOL 10 MG/ML IV BOLUS
INTRAVENOUS | Status: DC | PRN
  Administered 2020-11-28: 200 mg via INTRAVENOUS

## 2020-11-28 MED ORDER — INSULIN ASPART 100 UNIT/ML ~~LOC~~ SOLN: 3.0000 [IU] | Freq: Three times a day (TID) | SUBCUTANEOUS | Status: DC

## 2020-11-28 MED ORDER — ONDANSETRON HCL 4 MG/2ML IJ SOLN
INTRAMUSCULAR | Status: DC | PRN
  Administered 2020-11-28: 4 mg via INTRAVENOUS

## 2020-11-28 MED ORDER — MIDAZOLAM HCL 5 MG/5ML IJ SOLN
INTRAMUSCULAR | Status: DC | PRN
  Administered 2020-11-28: .5 mg via INTRAVENOUS

## 2020-11-28 MED ORDER — DEXTROSE-NACL 5-0.45 % IV SOLN: INTRAVENOUS | Status: AC

## 2020-11-28 MED ORDER — PROPOFOL 10 MG/ML IV BOLUS
INTRAVENOUS | Status: AC
  Filled 2020-11-28: qty 20

## 2020-11-28 SURGICAL SUPPLY — 48 items
BANDAGE ESMARK 6X9 LF (GAUZE/BANDAGES/DRESSINGS) IMPLANT
BNDG CMPR 9X4 STRL LF SNTH (GAUZE/BANDAGES/DRESSINGS)
BNDG CMPR 9X6 STRL LF SNTH (GAUZE/BANDAGES/DRESSINGS)
BNDG CMPR MED 10X6 ELC LF (GAUZE/BANDAGES/DRESSINGS) ×1
BNDG COHESIVE 4X5 TAN STRL (GAUZE/BANDAGES/DRESSINGS) IMPLANT
BNDG ELASTIC 4X5.8 VLCR STR LF (GAUZE/BANDAGES/DRESSINGS) ×1 IMPLANT
BNDG ELASTIC 6X10 VLCR STRL LF (GAUZE/BANDAGES/DRESSINGS) ×2 IMPLANT
BNDG ESMARK 4X9 LF (GAUZE/BANDAGES/DRESSINGS) IMPLANT
BNDG ESMARK 6X9 LF (GAUZE/BANDAGES/DRESSINGS)
BNDG GAUZE ELAST 4 BULKY (GAUZE/BANDAGES/DRESSINGS) ×2 IMPLANT
COVER SURGICAL LIGHT HANDLE (MISCELLANEOUS) ×4 IMPLANT
COVER WAND RF STERILE (DRAPES) IMPLANT
CUFF TOURN SGL QUICK 34 (TOURNIQUET CUFF)
CUFF TRNQT CYL 34X4.125X (TOURNIQUET CUFF) ×1 IMPLANT
DRAPE U-SHAPE 47X51 STRL (DRAPES) ×2 IMPLANT
DRSG PAD ABDOMINAL 8X10 ST (GAUZE/BANDAGES/DRESSINGS) ×2 IMPLANT
DRSG XEROFORM 1X8 (GAUZE/BANDAGES/DRESSINGS) IMPLANT
DURAPREP 26ML APPLICATOR (WOUND CARE) ×2 IMPLANT
ELECT REM PT RETURN 9FT ADLT (ELECTROSURGICAL) ×2
ELECTRODE REM PT RTRN 9FT ADLT (ELECTROSURGICAL) ×1 IMPLANT
GAUZE SPONGE 4X4 12PLY STRL (GAUZE/BANDAGES/DRESSINGS) ×3 IMPLANT
GAUZE SPONGE 4X4 12PLY STRL LF (GAUZE/BANDAGES/DRESSINGS) ×2 IMPLANT
GAUZE XEROFORM 1X8 LF (GAUZE/BANDAGES/DRESSINGS) ×2 IMPLANT
GAUZE XEROFORM 5X9 LF (GAUZE/BANDAGES/DRESSINGS) ×1 IMPLANT
GLOVE BIOGEL M STRL SZ7.5 (GLOVE) ×2 IMPLANT
GLOVE INDICATOR 8.0 STRL GRN (GLOVE) ×2 IMPLANT
GOWN STRL REUS W/ TWL LRG LVL3 (GOWN DISPOSABLE) ×1 IMPLANT
GOWN STRL REUS W/ TWL XL LVL3 (GOWN DISPOSABLE) ×1 IMPLANT
GOWN STRL REUS W/TWL LRG LVL3 (GOWN DISPOSABLE) ×2
GOWN STRL REUS W/TWL XL LVL3 (GOWN DISPOSABLE) ×2
HANDPIECE INTERPULSE COAX TIP (DISPOSABLE)
KIT BASIN OR (CUSTOM PROCEDURE TRAY) ×2 IMPLANT
KIT TURNOVER KIT B (KITS) ×2 IMPLANT
MANIFOLD NEPTUNE II (INSTRUMENTS) ×2 IMPLANT
NEEDLE 22X1 1/2 (OR ONLY) (NEEDLE) ×1 IMPLANT
NS IRRIG 1000ML POUR BTL (IV SOLUTION) ×2 IMPLANT
PACK ORTHO EXTREMITY (CUSTOM PROCEDURE TRAY) ×2 IMPLANT
PAD ARMBOARD 7.5X6 YLW CONV (MISCELLANEOUS) ×4 IMPLANT
PAD CAST 4YDX4 CTTN HI CHSV (CAST SUPPLIES) ×1 IMPLANT
PADDING CAST COTTON 4X4 STRL (CAST SUPPLIES) ×2
SET HNDPC FAN SPRY TIP SCT (DISPOSABLE) ×1 IMPLANT
SPONGE LAP 18X18 RF (DISPOSABLE) IMPLANT
STOCKINETTE IMPERVIOUS 9X36 MD (GAUZE/BANDAGES/DRESSINGS) ×1 IMPLANT
SUT ETHILON 2 0 FS 18 (SUTURE) IMPLANT
SUT ETHILON 3 0 PS 1 (SUTURE) ×1 IMPLANT
TUBE CONNECTING 12X1/4 (SUCTIONS) ×2 IMPLANT
UNDERPAD 30X36 HEAVY ABSORB (UNDERPADS AND DIAPERS) ×2 IMPLANT
YANKAUER SUCT BULB TIP NO VENT (SUCTIONS) ×2 IMPLANT

## 2020-11-28 NOTE — Brief Op Note (Signed)
11/28/2020  8:45 PM  PATIENT:  [name]  65 y.o. female  PRE-OPERATIVE DIAGNOSIS:  decubitus ulcer left foot  POST-OPERATIVE DIAGNOSIS:  decubitus ulcer left foot  PROCEDURE:  Procedure(s): IRRIGATION AND DEBRIDEMENT OF FOOT (Left)  SURGEON:  Surgeon(s) and Role:    * Trula Slade, DPM - Primary  PHYSICIAN ASSISTANT:   ASSISTANTS: none   ANESTHESIA:   general  EBL:  Minimal    BLOOD ADMINISTERED:none  DRAINS: none   LOCAL MEDICATIONS USED:  OTHER 10 cc marcaine plain  SPECIMEN:  Source of Specimen:  wound culture  DISPOSITION OF SPECIMEN:  PATHOLOGY  COUNTS:  YES  TOURNIQUET:  * Missing tourniquet times found for documented tourniquets in log: [id number] *  DICTATION: .Dragon Dictation  PLAN OF CARE: Admit to inpatient   PATIENT DISPOSITION:  PACU - hemodynamically stable.   Delay start of Pharmacological VTE agent (>24hrs) due to surgical blood loss or risk of bleeding: no   Intraoperative findings: No purulence noted but tracking present to the 1st interspace. Excised ulcer and loosely closed to allow for drainage.

## 2020-11-28 NOTE — Progress Notes (Signed)
Seen in treatment room. Scheduled for left foot I&D, debridement. Went over the surgery and postop course. NPO since before 8am. No further questions/concerns. Surgical consent signed and limb verified.

## 2020-11-28 NOTE — Anesthesia Procedure Notes (Signed)
Procedure Name: LMA Insertion Date/Time: 11/28/2020 8:16 PM Performed by: Zollie Scale, CRNA Pre-anesthesia Checklist: Patient identified, Emergency Drugs available, Suction available and Patient being monitored Patient Re-evaluated:Patient Re-evaluated prior to induction Oxygen Delivery Method: Circle System Utilized Preoxygenation: Pre-oxygenation with 100% oxygen Induction Type: IV induction LMA: LMA inserted LMA Size: 4.0 Number of attempts: 1 Airway Equipment and Method: Bite block Placement Confirmation: positive ETCO2 Tube secured with: Tape Dental Injury: Teeth and Oropharynx as per pre-operative assessment

## 2020-11-28 NOTE — Progress Notes (Signed)
Pharmacy Antibiotic Note  [name] is a 65 y.o. female admitted on 11/25/2020 with cellulitis.   Continues on Vancomycin and Zosyn I+D planned for today Scr stable Blood cultures NGTD  Plan: Vancomycin 1500 mg iv Q 48 hours Zosyn 3.375 grams iv Q 8 hours - 4 hr infusion Follow up plan -- ? Level needed  Height: 5\' 8"  (172.7 cm) Weight: 121.8 kg (268 lb 8.3 oz) IBW/kg (Calculated) : 63.9  Temp (24hrs), Avg:98.3 F (36.8 C), Min:97.7 F (36.5 C), Max:98.9 F (37.2 C)  Recent Labs  Lab 11/24/20 1752 11/25/20 0554 11/26/20 0130 11/27/20 0026 11/28/20 0123  WBC 12.1* 9.1 7.9 8.3 6.3  CREATININE 2.20* 2.12* 2.27* 2.58* 2.22*    Estimated Creatinine Clearance: 34.3 mL/min (A) (by C-G formula based on SCr of 2.22 mg/dL (H)).    No Known Allergies  Thank you Anette Guarneri, PharmD [phone]  11/28/2020 8:41 AM

## 2020-11-28 NOTE — CV Procedure (Signed)
BLE venous duplex completed.  Results can be found under chart review under CV PROC. 11/28/2020 6:05 PM Kemora Pinard RVT, RDMS

## 2020-11-28 NOTE — Anesthesia Preprocedure Evaluation (Signed)
Anesthesia Evaluation  Patient identified by MRN, date of birth, ID band Patient awake    Airway Mallampati: II  TM Distance: >3 FB     Dental   Pulmonary shortness of breath, sleep apnea ,    breath sounds clear to auscultation       Cardiovascular hypertension, + CAD, + Past MI and +CHF  + dysrhythmias + Cardiac Defibrillator  Rhythm:Regular Rate:Normal     Neuro/Psych PSYCHIATRIC DISORDERS  Neuromuscular disease    GI/Hepatic Neg liver ROS,   Endo/Other  diabetes  Renal/GU Renal disease     Musculoskeletal   Abdominal   Peds  Hematology  (+) anemia ,   Anesthesia Other Findings   Reproductive/Obstetrics                             Anesthesia Physical Anesthesia Plan  ASA: III  Anesthesia Plan: General   Post-op Pain Management:    Induction: Intravenous  PONV Risk Score and Plan: Ondansetron, Dexamethasone and Midazolam  Airway Management Planned: LMA  Additional Equipment:   Intra-op Plan:   Post-operative Plan:   Informed Consent: I have reviewed the patients History and Physical, chart, labs and discussed the procedure including the risks, benefits and alternatives for the proposed anesthesia with the patient or authorized representative who has indicated his/her understanding and acceptance.     Dental advisory given  Plan Discussed with: CRNA  Anesthesia Plan Comments:         Anesthesia Quick Evaluation

## 2020-11-28 NOTE — Progress Notes (Signed)
Attempted to inform patient's RN of need to transport to OR. Patient in alert and oriented. Informed two floor RNs (whom broadcasted to the floor via Vocera that patient was needing to go to the OR) and unit secretary of patient being transported since patient's RN was unavailable at the time. VSS. Wagoner DPM present to transport with this CRNA.

## 2020-11-28 NOTE — Progress Notes (Signed)
PROGRESS NOTE                                                                                                                                                                                                             Patient Demographics:    [name], is a 65 y.o. female, DOB - 1956-06-21, WLN:[id number]  Outpatient Primary MD for the patient is [name], MD    LOS - 3  Admit date - 11/25/2020    No chief complaint on file.      Brief Narrative (HPI from H&P) - 65 y.o. Caucasian female with medical history significant for coronary artery disease, hypertension, obstructive sleep apnea, type 2 diabetes mellitus, systolic CHF status post AICD due to episodes of V. tach in the past and previous history of osteomyelitis of the ankle and foot and depression, who presented to the emergency room with acute onset of worsening left foot swelling with erythema for the last month with left foot wound which has been followed by podiatry, on initial x-rays there was evidence of some subcutaneous air in the left foot and she was transferred from [facility] to [facility] -  for AICD compatible MRI.   Subjective:   Patient in bed, appears comfortable, denies any headache, no fever, no chest pain or pressure, no shortness of breath , no abdominal pain. No focal weakness.    Assessment  & Plan :     1.  Left big toe and foot cellulitis acute on chronic.  Being followed outpatient with podiatry, MRI of the left foot notes cellulitis with a small abscess, podiatry taking patient for surgical drainage on 11/28/2020, will check INR and type screen, continue empiric antibiotics for now which are Vancomycin and Zosyn, superficial nonsurgical wound culture growing Proteus however could be unreliable.  No sepsis.  Follow surgical culture results.  2.  CAD with history of chronic diastolic CHF.  Last echo few months ago shows EF of 55%, has AICD. She  follows with Dr. Caryl Comes for her cardiology issues, currently symptom-free, EKG is paced, no chest symptoms currently, continue combination of amiodarone, Coreg, Plavix, Imdur, ARB, statin for secondary prevention.  Note home dose diuretics have been reduced adjusted on 11/27/20.  Note patient is on amiodarone and has AICD for history of V. tach in the past.  Note does not have  confirmed paroxysmal A. fib in the chart.  3. Incidental COVID-19 infection and fully vaccinated and boosted patient.  Supportive care. Encouraged the patient to sit up in chair in the daytime use I-S and flutter valve for pulmonary toiletry.   4.  Morbid obesity BMI 40.  Follow with PCP for weight loss.  5.  OSA.  CPAP nightly.  6.  Dyslipidemia.  On statin.  7.  CKD4.  Baseline creatinine around 2 to 2.5.  Monitor.  8.  Moderately elevated D-dimer.  On moderate dose Lovenox, this is likely due to inflammation, will check lower extremity venous duplex on 11/28/2020 to completely rule out a blood clot.    9. DM type II.  Is on combination of Lantus, sliding scale insulin and premeal NovoLog.  Since she is n.p.o. and surgery is late in the evening on 11/28/2020 will change her to every 6 Accu-Cheks with sliding scale, gentle D5 drip for the next 12 hours.  Please readdress sliding scale, Premeal NovoLog and Lantus dose again on 11/29/2020 postop.   Lab Results  Component Value Date   HGBA1C 8.1 (H) 11/25/2020    CBG (last 3)  Recent Labs    11/27/20 1715 11/27/20 2031 11/28/20 0734  GLUCAP 112* 147* 105*     SpO2: 96 %  Recent Labs  Lab 11/24/20 1752 11/24/20 2007 11/24/20 2040 11/25/20 0554 11/26/20 0130 11/26/20 0133 11/27/20 0026 11/28/20 0123  WBC 12.1*  --   --  9.1 7.9  --  8.3 6.3  HGB 12.5  --   --  12.4 11.4*  --  11.0* 11.4*  HCT 38.7  --   --  39.0 34.6*  --  34.3* 33.6*  PLT 158  --   --  138* 137*  --  147* 157  CRP  --  2.7*  --  7.6* 12.7*  --  10.5* 9.5*  BNP  --   --   --  304.7*  --   92.8 101.3* 164.0*  DDIMER  --   --   --  1.54* 1.22*  --  1.27* 1.72*  PROCALCITON  --   --   --  0.17 0.21  --  0.13 0.10  AST 30  --   --  22 22  --  18 18  ALT 28  --   --  24 23  --  20 21  ALKPHOS 88  --   --  70 67  --  63 60  BILITOT 0.7  --   --  0.8 0.7  --  0.7 0.7  ALBUMIN 4.1  --   --  3.4* 3.0*  --  2.8* 3.0*  SARSCOV2NAA  --   --  POSITIVE*  --   --   --   --   --          Condition - Extremely Guarded  Family Communication  : Daughter [name] [phone] on 11/25/2020 message left at 9:33 AM, 11/28/20 message left at 10:15 AM  Code Status :  Full  Consults  :  Podiatry Dr Amalia Hailey  PUD Prophylaxis :    Procedures  :     Leg [place]  MRI - Cellulitis. Small fluid collection in the plantar subcutaneous fatty tissues at the level of the first MTP joint is most consistent with a small abscess (not osteomyelitis) and appears to be deep to a skin wound.       Disposition Plan  :    Status is:  Inpatient  Remains inpatient appropriate because:IV treatments appropriate due to intensity of illness or inability to take PO   Dispo: The patient is from: Home              Anticipated d/c is to: Home              Anticipated d/c date is: > 3 days              Patient currently is not medically stable to d/c.   Difficult to place patient No  DVT Prophylaxis  :  Lovenox    Lab Results  Component Value Date   PLT 157 11/28/2020    Diet :  Diet Order            Diet NPO time specified  Diet effective ____                  Inpatient Medications  Scheduled Meds: . amiodarone  200 mg Oral Daily  . vitamin C  500 mg Oral Daily  . calcium-vitamin D  1 tablet Oral BID WC  . carvedilol  3.125 mg Oral Q breakfast  . chlorhexidine  15 mL Mouth Rinse BID  . cholecalciferol  1,000 Units Oral Daily  . clopidogrel  75 mg Oral Daily  . DULoxetine  30 mg Oral QHS  . empagliflozin  10 mg Oral QAC breakfast  . enoxaparin (LOVENOX) injection  60 mg Subcutaneous Q24H  .  ferrous sulfate  325 mg Oral BID WC  . gabapentin  1,200 mg Oral QHS  . gabapentin  600 mg Oral Daily  . insulin aspart  0-20 Units Subcutaneous TID WC  . insulin aspart  0-5 Units Subcutaneous QHS  . insulin aspart  3 Units Subcutaneous TID WC  . insulin glargine  30 Units Subcutaneous Daily  . isosorbide mononitrate  30 mg Oral Daily  . levothyroxine  50 mcg Oral Q0600  . mouth rinse  15 mL Mouth Rinse q12n4p  . multivitamin with minerals  1 tablet Oral Daily  . omega-3 acid ethyl esters  1 g Oral Daily  . rosuvastatin  10 mg Oral Daily  . torsemide  20 mg Oral BID  . zinc sulfate  220 mg Oral Daily   Continuous Infusions: . piperacillin-tazobactam (ZOSYN)  IV 3.375 g (11/28/20 0342)  . remdesivir 100 mg in NS 100 mL 100 mg (11/28/20 0907)  . vancomycin 1,500 mg (11/27/20 0045)   PRN Meds:.acetaminophen **OR** [DISCONTINUED] acetaminophen, albuterol, chlorpheniramine-HYDROcodone, guaiFENesin-dextromethorphan, magnesium hydroxide, morphine injection, nitroGLYCERIN, [DISCONTINUED] ondansetron **OR** ondansetron (ZOFRAN) IV, traMADol, traZODone  Antibiotics  :    Anti-infectives (From admission, onward)   Start     Dose/Rate Route Frequency Ordered Stop   11/27/20 0000  vancomycin (VANCOREADY) IVPB 1500 mg/300 mL        1,500 mg 150 mL/hr over 120 Minutes Intravenous Every 48 hours 11/25/20 0607     11/26/20 1000  remdesivir 100 mg in sodium chloride 0.9 % 100 mL IVPB       "Followed by" Linked Group Details   100 mg 200 mL/hr over 30 Minutes Intravenous Daily 11/25/20 0550 11/30/20 0959   11/25/20 1200  piperacillin-tazobactam (ZOSYN) IVPB 3.375 g        3.375 g 12.5 mL/hr over 240 Minutes Intravenous Every 8 hours 11/25/20 0555     11/25/20 0800  remdesivir 200 mg in sodium chloride 0.9% 250 mL IVPB       "Followed by" Linked Group Details  200 mg 580 mL/hr over 30 Minutes Intravenous Once 11/25/20 0550 11/25/20 0925   11/25/20 0615  vancomycin (VANCOREADY) IVPB 1000 mg/200  mL  Status:  Discontinued        1,000 mg 200 mL/hr over 60 Minutes Intravenous  Once 11/25/20 0520 11/25/20 0530   11/25/20 0600  piperacillin-tazobactam (ZOSYN) IVPB 3.375 g        3.375 g 100 mL/hr over 30 Minutes Intravenous  Once 11/25/20 0520 11/25/20 0855       Time Spent in minutes  30   Lala Lund M.D on 11/28/2020 at 10:12 AM  To page go to [email or web address]   [facility] -  Office  [phone]   See all Orders from today for further details    Objective:   Vitals:   11/27/20 0509 11/27/20 2017 11/28/20 0009 11/28/20 0449  BP: 140/75 125/60  136/62  Pulse: 65 62 77 60  Resp: 18 18 19 18   Temp: 98.8 F (37.1 C) 98.9 F (37.2 C)  97.7 F (36.5 C)  TempSrc: Axillary Oral  Oral  SpO2: 99% 98% 96% 96%  Weight:      Height:        Wt Readings from Last 3 Encounters:  11/25/20 121.8 kg  11/24/20 117.9 kg  10/16/20 120.7 kg     Intake/Output Summary (Last 24 hours) at 11/28/2020 1012 Last data filed at 11/28/2020 0906 Gross per 24 hour  Intake 600 ml  Output 750 ml  Net -150 ml     Physical Exam  Awake Alert, No new F.N deficits, Normal affect [place].AT,PERRAL Supple Neck,No JVD, No cervical lymphadenopathy appriciated.  Symmetrical Chest wall movement, Good air movement bilaterally, CTAB RRR,No Gallops, Rubs or new Murmurs, No Parasternal Heave +ve B.Sounds, Abd Soft, No tenderness, No organomegaly appriciated, No rebound - guarding or rigidity. L Big toe and forefoot cellulitis, amputated left and right second toe    Data Review:    CBC Recent Labs  Lab 11/24/20 1752 11/25/20 0554 11/26/20 0130 11/27/20 0026 11/28/20 0123  WBC 12.1* 9.1 7.9 8.3 6.3  HGB 12.5 12.4 11.4* 11.0* 11.4*  HCT 38.7 39.0 34.6* 34.3* 33.6*  PLT 158 138* 137* 147* 157  MCV 95.8 97.7 95.8 96.1 94.4  MCH 30.9 31.1 31.6 30.8 32.0  MCHC 32.3 31.8 32.9 32.1 33.9  RDW 13.2 13.2 13.3 13.2 13.2  LYMPHSABS 1.4 1.5 1.2 1.5 1.4  MONOABS 0.9 0.7 0.6 0.7 0.7  EOSABS  0.0 0.0 0.1 0.1 0.1  BASOSABS 0.0 0.0 0.0 0.0 0.0    Recent Labs  Lab 11/24/20 1752 11/24/20 2007 11/25/20 0554 11/26/20 0130 11/26/20 0133 11/27/20 0026 11/28/20 0123  NA 134*  --  138 137  --  139 140  K 4.0  --  3.8 4.2  --  4.1 3.7  CL 93*  --  98 99  --  101 101  CO2 29  --  26 28  --  29 27  GLUCOSE 262*  --  213* 157*  --  104* 127*  BUN 65*  --  60* 56*  --  53* 48*  CREATININE 2.20*  --  2.12* 2.27*  --  2.58* 2.22*  CALCIUM 9.0  --  8.8* 9.1  --  8.8* 9.0  AST 30  --  22 22  --  18 18  ALT 28  --  24 23  --  20 21  ALKPHOS 88  --  70 67  --  63 60  BILITOT  0.7  --  0.8 0.7  --  0.7 0.7  ALBUMIN 4.1  --  3.4* 3.0*  --  2.8* 3.0*  MG  --   --  2.3 2.6*  --  2.4 2.2  CRP  --  2.7* 7.6* 12.7*  --  10.5* 9.5*  DDIMER  --   --  1.54* 1.22*  --  1.27* 1.72*  PROCALCITON  --   --  0.17 0.21  --  0.13 0.10  HGBA1C  --   --  8.1*  --   --   --   --   BNP  --   --  304.7*  --  92.8 101.3* 164.0*    ------------------------------------------------------------------------------------------------------------------ No results for input(s): CHOL, HDL, LDLCALC, TRIG, CHOLHDL, LDLDIRECT in the last 72 hours.  Lab Results  Component Value Date   HGBA1C 8.1 (H) 11/25/2020   ------------------------------------------------------------------------------------------------------------------ No results for input(s): TSH, T4TOTAL, T3FREE, THYROIDAB in the last 72 hours.  Invalid input(s): FREET3  Cardiac Enzymes No results for input(s): CKMB, TROPONINI, MYOGLOBIN in the last 168 hours.  Invalid input(s): CK ------------------------------------------------------------------------------------------------------------------    Component Value Date/Time   BNP 164.0 (H) 11/28/2020 0123    Micro Results Recent Results (from the past 240 hour(s))  Aerobic Culture w Gram Stain (superficial specimen)     Status: None (Preliminary result)   Collection Time: 11/24/20  8:01 PM    Specimen: Foot; Wound  Result Value Ref Range Status   Specimen Description   Final    FOOT Performed at [facility], [address] [place], [place] [address]    Special Requests   Final    Normal Performed at [facility], [address] [place], [place] [address]    Gram Stain   Final    NO WBC SEEN ABUNDANT GRAM POSITIVE COCCI RARE GRAM NEGATIVE RODS    Culture   Final    FEW PROTEUS MIRABILIS CULTURE REINCUBATED FOR BETTER GROWTH Performed at [facility], [place] [address], [place], [place] [address]    Report Status PENDING  Incomplete   Organism ID, Bacteria PROTEUS MIRABILIS  Final      Susceptibility   Proteus mirabilis - MIC*    AMPICILLIN <=2 SENSITIVE Sensitive     CEFAZOLIN <=4 SENSITIVE Sensitive     CEFEPIME <=0.12 SENSITIVE Sensitive     CEFTAZIDIME <=1 SENSITIVE Sensitive     CEFTRIAXONE <=0.25 SENSITIVE Sensitive     CIPROFLOXACIN <=0.25 SENSITIVE Sensitive     GENTAMICIN <=1 SENSITIVE Sensitive     IMIPENEM 2 SENSITIVE Sensitive     TRIMETH/SULFA <=20 SENSITIVE Sensitive     AMPICILLIN/SULBACTAM <=2 SENSITIVE Sensitive     PIP/TAZO <=4 SENSITIVE Sensitive     * FEW PROTEUS MIRABILIS  Blood culture (routine x 2)     Status: None (Preliminary result)   Collection Time: 11/24/20  8:07 PM   Specimen: BLOOD  Result Value Ref Range Status   Specimen Description BLOOD RIGHT ANTECUBITAL  Final   Special Requests   Final    BOTTLES DRAWN AEROBIC AND ANAEROBIC Blood Culture results may not be optimal due to an inadequate volume of blood received in culture bottles   Culture   Final    NO GROWTH 4 DAYS Performed at [facility], [address] [place], [place] [address]    Report Status PENDING  Incomplete  Blood culture (routine x 2)     Status: None (Preliminary result)   Collection Time:  11/24/20  8:07 PM   Specimen: BLOOD  Result Value Ref Range Status   Specimen Description BLOOD LEFT ANTECUBITAL  Final   Special  Requests   Final    BOTTLES DRAWN AEROBIC AND ANAEROBIC Blood Culture results may not be optimal due to an inadequate volume of blood received in culture bottles   Culture   Final    NO GROWTH 4 DAYS Performed at [facility], [address], [place], [place] [address]    Report Status PENDING  Incomplete  Resp Panel by RT-PCR (Flu A&B, Covid) Nasopharyngeal Swab     Status: Abnormal   Collection Time: 11/24/20  8:40 PM   Specimen: Nasopharyngeal Swab; Nasopharyngeal(NP) swabs in vial transport medium  Result Value Ref Range Status   SARS Coronavirus 2 by RT PCR POSITIVE (A) NEGATIVE Final    Comment: RESULT CALLED TO, READ BACK BY AND VERIFIED WITH: [place] RN 11/24/2020 2144 JG (NOTE) SARS-CoV-2 target nucleic acids are DETECTED.  The SARS-CoV-2 RNA is generally detectable in upper respiratory specimens during the acute phase of infection. Positive results are indicative of the presence of the identified virus, but do not rule out bacterial infection or co-infection with other pathogens not detected by the test. Clinical correlation with patient history and other diagnostic information is necessary to determine patient infection status. The expected result is Negative.  Fact Sheet for Patients: EntrepreneurPulse.com.au  Fact Sheet for Healthcare Providers: IncredibleEmployment.be  This test is not yet approved or cleared by the [place] FDA and  has been authorized for detection and/or diagnosis of SARS-CoV-2 by FDA under an Emergency Use Authorization (EUA).  This EUA will remain in effect (meaning this test can  be used) for the duration of  the COVID-19 declaration under Section 564(b)(1) of the Act, 21 U.S.C. section 360bbb-3(b)(1), unless the authorization is terminated or revoked sooner.     Influenza A by PCR NEGATIVE NEGATIVE Final   Influenza B by PCR NEGATIVE NEGATIVE Final    Comment: (NOTE) The Xpert  Xpress SARS-CoV-2/FLU/RSV plus assay is intended as an aid in the diagnosis of influenza from Nasopharyngeal swab specimens and should not be used as a sole basis for treatment. Nasal washings and aspirates are unacceptable for Xpert Xpress SARS-CoV-2/FLU/RSV testing.  Fact Sheet for Patients: EntrepreneurPulse.com.au  Fact Sheet for Healthcare Providers: IncredibleEmployment.be  This test is not yet approved or cleared by the [place] FDA and has been authorized for detection and/or diagnosis of SARS-CoV-2 by FDA under an Emergency Use Authorization (EUA). This EUA will remain in effect (meaning this test can be used) for the duration of the COVID-19 declaration under Section 564(b)(1) of the Act, 21 U.S.C. section 360bbb-3(b)(1), unless the authorization is terminated or revoked.  Performed at [facility], [address] [place], [place] [address]   MRSA PCR Screening     Status: None   Collection Time: 11/25/20  5:39 AM   Specimen: Nasal Mucosa; Nasopharyngeal  Result Value Ref Range Status   MRSA by PCR NEGATIVE NEGATIVE Final    Comment:        The GeneXpert MRSA Assay (FDA approved for NASAL specimens only), is one component of a comprehensive MRSA colonization surveillance program. It is not intended to diagnose MRSA infection nor to guide or monitor treatment for MRSA infections. Performed at [facility], [place] [address], [place], [place] [address]   Culture, blood (routine x 2)     Status: None (Preliminary result)  Collection Time: 11/25/20  6:17 AM   Specimen: BLOOD  Result Value Ref Range Status   Specimen Description BLOOD RIGHT ANTECUBITAL  Final   Special Requests AEROBIC BOTTLE ONLY Blood Culture adequate volume  Final   Culture   Final    NO GROWTH 3 DAYS Performed at [facility], [address] [place], [place] [address]    Report Status PENDING  Incomplete  Culture, blood (routine  x 2)     Status: None (Preliminary result)   Collection Time: 11/25/20  6:32 AM   Specimen: BLOOD LEFT HAND  Result Value Ref Range Status   Specimen Description BLOOD LEFT HAND  Final   Special Requests AEROBIC BOTTLE ONLY Blood Culture adequate volume  Final   Culture   Final    NO GROWTH 3 DAYS Performed at [facility], [address], [place], [place] [address]    Report Status PENDING  Incomplete    Radiology Reports MR FOOT LEFT WO CONTRAST  Addendum Date: 11/27/2020   ADDENDUM REPORT: 11/27/2020 19:01 ADDENDUM: Request made for correction to impression. Original report dictated by Dr. Pollyann Kennedy, pt scheduled for OR in am. Impression should read cellulitis. Small fluid collection in the plantar subcutaneous fatty tissues at the level of the first MTP joint is most consistent with a small abscess (not osteomyelitis) and appears to be deep to a skin wound. Electronically Signed   By: Donavan Foil M.D.   On: 11/27/2020 19:01   Result Date: 11/27/2020 CLINICAL DATA:  Acute on chronic left great toe and foot cellulitis. EXAM: MRI OF THE LEFT FOOT WITHOUT CONTRAST TECHNIQUE: Multiplanar, multisequence MR imaging of the left foot was performed. No intravenous contrast was administered. COMPARISON:  Plain films left foot 09/28/2018. FINDINGS: Bones/Joint/Cartilage There is no marrow edema to suggest osteomyelitis. No joint effusion. Midfoot osteoarthritis is noted. Ligaments Intact. Muscles and Tendons No intramuscular fluid collection or mass. No tendon tear. Atrophy of intrinsic musculature the foot noted. Soft tissues There appears to be a skin wound on the plantar surface of the foot deep to the first MTP joint. A fluid collection measuring 0.6 cm transverse by 0.6 cm craniocaudal by 1 cm long is seen in the subjacent subcutaneous fatty tissues. Subcutaneous edema is present diffusely. IMPRESSION: Cellulitis. Small fluid collection in the plantar subcutaneous fatty tissues at the level of  the first MTP joint is most consistent with an osteomyelitis and appears to be deep to a skin wound. Negative for osteomyelitis, septic joint or myositis. Electronically Signed: By: Inge Rise M.D. On: 11/27/2020 14:17   DG Chest Port 1 View  Result Date: 11/27/2020 CLINICAL DATA:  Shortness of breath, COVID-19 EXAM: PORTABLE CHEST 1 VIEW COMPARISON:  Portable exam 0637 hours compared to 11/25/2020 FINDINGS: LEFT subclavian ICD leads project over RIGHT atrium, RIGHT ventricle, and coronary sinus. Normal heart size, mediastinal contours, and pulmonary vascularity. Atherosclerotic calcification aorta. Minimal patchy infiltrate in RIGHT upper lobe above minor fissure and at RIGHT base. LEFT lung clear. No pleural effusion or pneumothorax. IMPRESSION: Minimal patchy RIGHT lung infiltrates consistent with multifocal pneumonia. Electronically Signed   By: Lavonia Dana M.D.   On: 11/27/2020 08:15   DG Chest Port 1 View  Result Date: 11/25/2020 CLINICAL DATA:  COVID-19. EXAM: PORTABLE CHEST 1 VIEW COMPARISON:  October 02, 2020 FINDINGS: No pneumothorax. The AICD device is stable. The cardiomediastinal silhouette is stable. The left lung is clear. Minimal opacity seen in the right base. No other acute abnormalities.  IMPRESSION: 1. Minimal opacity in the right base could represent atelectasis or early infiltrate. Recommend attention on follow-up. No other acute abnormalities. Electronically Signed   By: Dorise Bullion III M.D   On: 11/25/2020 09:48   MM 3D SCREEN BREAST BILATERAL  Result Date: 11/09/2020 CLINICAL DATA:  Screening. EXAM: DIGITAL SCREENING BILATERAL MAMMOGRAM WITH TOMO AND CAD COMPARISON:  Previous exam(s). ACR Breast Density Category b: There are scattered areas of fibroglandular density. FINDINGS: There are no findings suspicious for malignancy. The images were evaluated with computer-aided detection. IMPRESSION: No mammographic evidence of malignancy. A result letter of this screening  mammogram will be mailed directly to the patient. RECOMMENDATION: Screening mammogram in one year. (Code:SM-B-01Y) BI-RADS CATEGORY  1: Negative. Electronically Signed   By: Dorise Bullion III M.D   On: 11/09/2020 13:22

## 2020-11-28 NOTE — Progress Notes (Signed)
Placed patient on CPAP for the night via auto-mode.  

## 2020-11-28 NOTE — Transfer of Care (Signed)
Immediate Anesthesia Transfer of Care Note  Patient: [name]  Procedure(s) Performed: IRRIGATION AND DEBRIDEMENT OF FOOT (Left Foot)  Patient Location: PACU  Anesthesia Type:General  Level of Consciousness: awake, alert  and oriented  Airway & Oxygen Therapy: Patient Spontanous Breathing  Post-op Assessment: Report given to RN and Post -op Vital signs reviewed and stable  Post vital signs: Reviewed and stable  Last Vitals:  Vitals Value Taken Time  BP 145/58 11/28/20 2052  Temp 36.1 C 11/28/20 2050  Pulse 60 11/28/20 2058  Resp 8 11/28/20 2058  SpO2 96 % 11/28/20 2058  Vitals shown include unvalidated device data.  Last Pain:  Vitals:   11/28/20 2050  TempSrc:   PainSc: 0-No pain         Complications: No complications documented.

## 2020-11-29 ENCOUNTER — Encounter (HOSPITAL_COMMUNITY): Payer: Self-pay | Admitting: Podiatry

## 2020-11-29 ENCOUNTER — Encounter (HOSPITAL_COMMUNITY)
Admission: RE | Disposition: A | Discharge status: Home Health | Payer: Self-pay | Source: Other Acute Inpatient Hospital | Attending: Internal Medicine

## 2020-11-29 LAB — C-REACTIVE PROTEIN: CRP: 8.1 mg/dL — ABNORMAL HIGH (ref <1.0)

## 2020-11-29 LAB — AEROBIC CULTURE W GRAM STAIN (SUPERFICIAL SPECIMEN)
Gram Stain: NONE SEEN
Special Requests: NORMAL

## 2020-11-29 LAB — CULTURE, BLOOD (ROUTINE X 2)
Culture: NO GROWTH
Culture: NO GROWTH

## 2020-11-29 LAB — D-DIMER, QUANTITATIVE: D-Dimer, Quant: 1.54 ug/mL-FEU — ABNORMAL HIGH (ref 0.00–0.50)

## 2020-11-29 LAB — CBC WITH DIFFERENTIAL/PLATELET
Abs Immature Granulocytes: 0.02 10*3/uL (ref 0.00–0.07)
Basophils Absolute: 0 10*3/uL (ref 0.0–0.1)
Basophils Relative: 0 %
Eosinophils Absolute: 0 10*3/uL (ref 0.0–0.5)
Eosinophils Relative: 0 %
HCT: 38.8 % (ref 36.0–46.0)
Hemoglobin: 12.9 g/dL (ref 12.0–15.0)
Immature Granulocytes: 0 %
Lymphocytes Relative: 15 %
Lymphs Abs: 1 10*3/uL (ref 0.7–4.0)
MCH: 31.5 pg (ref 26.0–34.0)
MCHC: 33.2 g/dL (ref 30.0–36.0)
MCV: 94.9 fL (ref 80.0–100.0)
Monocytes Absolute: 0.2 10*3/uL (ref 0.1–1.0)
Monocytes Relative: 2 %
Neutro Abs: 5.9 10*3/uL (ref 1.7–7.7)
Neutrophils Relative %: 83 %
Platelets: 169 10*3/uL (ref 150–400)
RBC: 4.09 MIL/uL (ref 3.87–5.11)
RDW: 13.1 % (ref 11.5–15.5)
WBC: 7.2 10*3/uL (ref 4.0–10.5)
nRBC: 0 % (ref 0.0–0.2)

## 2020-11-29 LAB — COMPREHENSIVE METABOLIC PANEL
ALT: 19 U/L (ref 0–44)
AST: 22 U/L (ref 15–41)
Albumin: 3.3 g/dL — ABNORMAL LOW (ref 3.5–5.0)
Alkaline Phosphatase: 67 U/L (ref 38–126)
Anion gap: 13 (ref 5–15)
BUN: 42 mg/dL — ABNORMAL HIGH (ref 8–23)
CO2: 25 mmol/L (ref 22–32)
Calcium: 9.2 mg/dL (ref 8.9–10.3)
Chloride: 102 mmol/L (ref 98–111)
Creatinine, Ser: 1.81 mg/dL — ABNORMAL HIGH (ref 0.44–1.00)
GFR, Estimated: 30 mL/min — ABNORMAL LOW (ref >60)
Glucose, Bld: 146 mg/dL — ABNORMAL HIGH (ref 70–99)
Potassium: 4.3 mmol/L (ref 3.5–5.1)
Sodium: 140 mmol/L (ref 135–145)
Total Bilirubin: 0.5 mg/dL (ref 0.3–1.2)
Total Protein: 7.4 g/dL (ref 6.5–8.1)

## 2020-11-29 LAB — PROCALCITONIN: Procalcitonin: <0.1 ng/mL

## 2020-11-29 LAB — MAGNESIUM: Magnesium: 2.1 mg/dL (ref 1.7–2.4)

## 2020-11-29 LAB — GLUCOSE, CAPILLARY
Glucose-Capillary: 225 mg/dL — ABNORMAL HIGH (ref 70–99)
Glucose-Capillary: 276 mg/dL — ABNORMAL HIGH (ref 70–99)
Glucose-Capillary: 156 mg/dL — ABNORMAL HIGH (ref 70–99)
Glucose-Capillary: 192 mg/dL — ABNORMAL HIGH (ref 70–99)

## 2020-11-29 LAB — BRAIN NATRIURETIC PEPTIDE: B Natriuretic Peptide: 230.9 pg/mL — ABNORMAL HIGH (ref 0.0–100.0)

## 2020-11-29 SURGERY — INCISION AND DRAINAGE
Anesthesia: Choice | Laterality: Left

## 2020-11-29 NOTE — Progress Notes (Signed)
Orthopedic Tech Progress Note Patient Details:  [name] 1956/10/04 [id number]  Ortho Devices Type of Ortho Device: Darco shoe Ortho Device/Splint Location: LLE Ortho Device/Splint Interventions: Ordered   Post Interventions Patient Tolerated: Well Instructions Provided: Care of device   [name] 11/29/2020, 4:44 PM

## 2020-11-29 NOTE — Op Note (Signed)
PATIENT:  [name]  65 y.o. female  PRE-OPERATIVE DIAGNOSIS:  decubitus ulcer left foot  POST-OPERATIVE DIAGNOSIS:  decubitus ulcer left foot  PROCEDURE:  Procedure(s): IRRIGATION AND DEBRIDEMENT OF FOOT (Left)  SURGEON:  Surgeon(s) and Role:    * Trula Slade, DPM - Primary  PHYSICIAN ASSISTANT:   ASSISTANTS: none   ANESTHESIA:   general  EBL:  Minimal    BLOOD ADMINISTERED:none  DRAINS: none   LOCAL MEDICATIONS USED:  OTHER 10 cc marcaine plain  SPECIMEN:  Source of Specimen:  wound culture  DISPOSITION OF SPECIMEN:  PATHOLOGY  COUNTS:  YES  TOURNIQUET:  * Missing tourniquet times found for documented tourniquets in log: [id number] *  DICTATION: .Dragon Dictation  PLAN OF CARE: Admit to inpatient   PATIENT DISPOSITION:  PACU - hemodynamically stable.   Delay start of Pharmacological VTE agent (>24hrs) due to surgical blood loss or risk of bleeding: no   Indications for surgery: 65 year old female was admitted to the hospital for cellulitis of her left foot.  She has had an open wound has been draining for some time.  Over the weekend she noticed the foot becoming more red and she was originally seen at [facility] however transferred to [facility] due to MRI compatible with her pacemaker.  She is continued have erythema.  MRI did show a small abscess.  Due to this I discussed with her incision and drainage, deep excision of the wound.  We discussed alternatives, risks, complications.  No promises or guarantees were given second procedure all questions were answered the best my ability.  Procedure in detail: Patient was both verbally and visually identified by myself, the nursing staff, and the anesthesia staff preoperatively.  She was brought directly from her room to the operating room given her COVID status.  LMA was placed by the anesthesia staff.  The left lower extremities and scrubbed, prepped, draped in normal sterile  fashion.  Timeout was performed.  At this time to some elliptical incisions were planned on the plantar aspect of the left foot submetatarsal 1 to excise the area of the ulceration.  Minimal amount of purulence was identified and a culture was obtained.  A loss of 15 blade scalpel as well as a rongeur to debride all nonviable devitalized tissue.  The wound did probe to the dorsal first interspace but there is no further probing, tracking or undermining.  There is no further purulence or signs of an abscess noted today.  There is no exposed bone or probing to bone today although close.  The incision was copiously irrigated with saline and hemostasis achieved.  The incision was loosely closed with 3-0 Prolene to allow for wound drainage.  Nonadherent dressing followed by dry sterile dressing was applied.  She was awoken from anesthesia and found to tolerate the procedure well and complications.  Transferred to PACU vital sign stable and vascular status intact.  Will remain inpatient for now.  Await cultures and likely discharge 48 hours after surgery with oral antibiotics pending clinical response.

## 2020-11-29 NOTE — Progress Notes (Addendum)
Subjective: POD #1 s/p left foot wound excision, I&D.  She says that she is doing well and no fevers, chills.  She had no pain.  She has no concerns today otherwise.  Objective: AAO x3, NAD Incision plantar aspect the foot loosely closed with sutures but able to drain.  Some amount of bloody drainage on the bandage but there is no purulence.  Erythema and edema has improved on the first MPJ today.  There is no ascending cellulitis but no fluctuation crepitation today there is no malodor.  No pain with calf compression, swelling, warmth, erythema  Assessment: POD #1 s/p left foot incision and drainage, wound excision.  Plan: She is doing well.  I change the bandage today and the erythema has improved.  For now continue IV antibiotics await culture results before discharge for antibiotic recommendations.  Continue daily dressing changes.  Upon discharge possible home health to help assist in dressing changes if needed.    Also given the long-term nature of the wound recommend arterial studies to check circulation. Studies ordered.   Celesta Gentile, DPM

## 2020-11-29 NOTE — Progress Notes (Signed)
Physical Therapy Treatment Patient Details Name: [name] MRN: [id number] DOB: 05-04-1956 Today's Date: 11/29/2020    History of Present Illness Pt is a 65 y.o. female admitted 11/25/20 with worsening L foot swelling; MRI 2/21 with L foot cellulitis, small fluid collection at first MTP joint most consistent for osteomyelitis. s/p I&D on 2/22, no weight bearing restrictions, Darco shoe ordered;  Incidental (+) COVID-19. PMH includes L ankle/foot osteomyelitis, vtach s/p ACID, HTN, CAD, OSA, DM2, CHF, depression.    PT Comments    Continuing work on functional mobility and activity tolerance;  Session focused on going gently into first bout of walking postop; Once she was up and managing in the room, she was asking to walk the hallways again; Overall managing in the room; We used the cam boot today, but [name] indicated he will order a Darco Wedge shoe -- plan to practice with the wedge shoe once it is available; Overall managing well and using the cane well; current plan for Outpt PT follow up remains appropriate  Follow Up Recommendations  Outpatient PT (balance/vestibular)     Equipment Recommendations  None recommended by PT    Recommendations for Other Services       Precautions / Restrictions Precautions Precautions: Fall Required Braces or Orthoses: Other Brace Other Brace: L foot cam walker boot in room; [name] plans to order a Darco shoe    Mobility  Bed Mobility               General bed mobility comments: received sitting in recliner    Transfers Overall transfer level: Needs assistance Equipment used: None Transfers: Sit to/from Stand Sit to Stand: Supervision         General transfer comment: Supervision today to monitor for any changes or difficulty postop; used cam boot; noted dependence on UEs to push and stabilize  Ambulation/Gait Ambulation/Gait assistance: Supervision Gait Distance (Feet): 300 Feet Assistive device: Straight  cane Gait Pattern/deviations: Step-through pattern;Decreased stride length;Decreased weight shift to left     General Gait Details: Able to walk the hallways with cam boot on and cane; Able to mostly keep weight on her L heel; Supervision for safety with first walk postop, but managing well   Stairs             Wheelchair Mobility    Modified Rankin (Stroke Patients Only)       Balance     Sitting balance-Leahy Scale: Good                                      Cognition Arousal/Alertness: Awake/alert Behavior During Therapy: WFL for tasks assessed/performed Overall Cognitive Status: Within Functional Limits for tasks assessed                                        Exercises      General Comments        Pertinent Vitals/Pain Pain Assessment: No/denies pain Pain Intervention(s): Monitored during session    Home Living                      Prior Function            PT Goals (current goals can now be found in the care plan section) Acute Rehab PT Goals Patient  Stated Goal: for Lt foot to heal PT Goal Formulation: With patient Time For Goal Achievement: 12/09/20 Potential to Achieve Goals: Good Progress towards PT goals: Progressing toward goals    Frequency    Min 3X/week      PT Plan Current plan remains appropriate    Co-evaluation              AM-PAC PT "6 Clicks" Mobility   Outcome Measure  Help needed turning from your back to your side while in a flat bed without using bedrails?: None Help needed moving from lying on your back to sitting on the side of a flat bed without using bedrails?: None Help needed moving to and from a bed to a chair (including a wheelchair)?: None Help needed standing up from a chair using your arms (e.g., wheelchair or bedside chair)?: None Help needed to walk in hospital room?: None Help needed climbing 3-5 steps with a railing? : A Little 6 Click Score: 23     End of Session Equipment Utilized During Treatment: Other (comment) (cam boot) Activity Tolerance: Patient tolerated treatment well Patient left: in chair;with call bell/phone within reach Nurse Communication: Mobility status PT Visit Diagnosis: Unsteadiness on feet (R26.81);Other abnormalities of gait and mobility (R26.89)     Time: 4462-8638 PT Time Calculation (min) (ACUTE ONLY): 25 min  Charges:  $Gait Training: 23-37 mins                     Roney Marion, [place]  Acute Rehabilitation Services Pager [phone] Office [phone]    [place] 11/29/2020, 2:03 PM

## 2020-11-29 NOTE — Progress Notes (Signed)
PROGRESS NOTE                                                                                                                                                                                                             Patient Demographics:    [name], is a 65 y.o. female, DOB - [birth year], ZOX:[id number]  Outpatient Primary MD for the patient is [name], MD    LOS - 4  Admit date - 11/25/2020    No chief complaint on file.      Brief Narrative (HPI from H&P) - 65 y.o. Caucasian female with medical history significant for coronary artery disease, hypertension, obstructive sleep apnea, type 2 diabetes mellitus, systolic CHF status post AICD due to episodes of V. tach in the past and previous history of osteomyelitis of the ankle and foot and depression, who presented to the emergency room with acute onset of worsening left foot swelling with erythema for the last month with left foot wound which has been followed by podiatry, on initial x-rays there was evidence of some subcutaneous air in the left foot and she was transferred from [facility] to [facility] for AICD compatible MRI.   Subjective:   No acute issues or events overnight, denies postoperative pain, nausea vomiting diarrhea constipation headache fevers or chills.    Assessment  & Plan :     Left big toe and foot cellulitis and concurrent abscess and ulcer acute on chronic   Being followed outpatient with podiatry, MRI of the left foot notes cellulitis with a small abscess S/P debridement on 11/28/2020 Continue Vancomycin and Zosyn - follow cultures - unlikely clean margins   CAD with history of chronic diastolic CHF Previous echo EF of 55%, has AICD.  Continue combination of amiodarone, Coreg, Plavix, Imdur, ARB, statin for secondary prevention.   Home dose diuretics have been reduced adjusted on 11/27/20.  Note patient is on amiodarone and has AICD for history of V. tach in the past.  Note does not have  confirmed paroxysmal A. fib in the chart.  Incidental COVID-19 infection and fully vaccinated and boosted patient.   Supportive care. Encouraged the patient to sit up in chair in the daytime use I-S and flutter valve for pulmonary toiletry.  Moderately elevated D-dimer  On moderate dose Lovenox, this is likely due to inflammation, DVT study negative bilaterally.    IDDM type II, uncontrolled.  Continue Lantus, sliding scale insulin and premeal NovoLog.   Morbid obesity BMI 40.  Follow with  PCP for weight loss. OSA.  CPAP nightly. Dyslipidemia.  On statin. CKD4.  Baseline creatinine around 2 to 2.5.  Monitor.   Lab Results  Component Value Date   HGBA1C 8.1 (H) 11/25/2020    CBG (last 3)  Recent Labs    11/28/20 1711 11/28/20 1943 11/28/20 2051  GLUCAP 102* 111* 86     SpO2: 99 %  Recent Labs  Lab 11/24/20 2040 11/25/20 0554 11/26/20 0130 11/26/20 0133 11/27/20 0026 11/28/20 0123 11/28/20 1041 11/29/20 0029  WBC  --  9.1 7.9  --  8.3 6.3  --  7.2  HGB  --  12.4 11.4*  --  11.0* 11.4*  --  12.9  HCT  --  39.0 34.6*  --  34.3* 33.6*  --  38.8  PLT  --  138* 137*  --  147* 157  --  169  CRP  --  7.6* 12.7*  --  10.5* 9.5*  --  8.1*  BNP  --  304.7*  --  92.8 101.3* 164.0*  --  230.9*  DDIMER  --  1.54* 1.22*  --  1.27* 1.72*  --  1.54*  PROCALCITON  --  0.17 0.21  --  0.13 0.10  --  <0.10  AST  --  22 22  --  18 18  --  22  ALT  --  24 23  --  20 21  --  19  ALKPHOS  --  70 67  --  63 60  --  67  BILITOT  --  0.8 0.7  --  0.7 0.7  --  0.5  ALBUMIN  --  3.4* 3.0*  --  2.8* 3.0*  --  3.3*  INR  --   --   --   --   --   --  1.2  --   SARSCOV2NAA POSITIVE*  --   --   --   --   --   --   --          Condition - Extremely Guarded  Family Communication  : Daughter [name] [phone] - unavailable  Code Status :  Full  Consults  :  Podiatry Dr Amalia Hailey  PUD Prophylaxis :    Procedures  :     Leg [place] negative bilaterally  MRI - Cellulitis. Small fluid  collection in the plantar subcutaneous fatty tissues at the level of the first MTP joint is most consistent with a small abscess (not osteomyelitis) and appears to be deep to a skin wound.       Disposition Plan  :    Status is: Inpatient  Remains inpatient appropriate because:IV treatments appropriate due to intensity of illness or inability to take PO   Dispo: The patient is from: Home              Anticipated d/c is to: Home              Anticipated d/c date is: 2 days              Patient currently is not medically stable to d/c.   Difficult to place patient No  DVT Prophylaxis  :  Lovenox    Lab Results  Component Value Date   PLT 169 11/29/2020    Diet :  Diet Order            Diet heart healthy/carb modified Room service appropriate? Yes; Fluid consistency: Thin  Diet effective now  Inpatient Medications  Scheduled Meds: . amiodarone  200 mg Oral Daily  . vitamin C  500 mg Oral Daily  . calcium-vitamin D  1 tablet Oral BID WC  . carvedilol  3.125 mg Oral Q breakfast  . chlorhexidine  15 mL Mouth Rinse BID  . cholecalciferol  1,000 Units Oral Daily  . clopidogrel  75 mg Oral Daily  . DULoxetine  30 mg Oral QHS  . empagliflozin  10 mg Oral QAC breakfast  . enoxaparin (LOVENOX) injection  60 mg Subcutaneous Q24H  . ferrous sulfate  325 mg Oral BID WC  . gabapentin  1,200 mg Oral QHS  . gabapentin  600 mg Oral Daily  . insulin aspart  0-20 Units Subcutaneous Q6H  . insulin glargine  30 Units Subcutaneous Daily  . isosorbide mononitrate  30 mg Oral Daily  . levothyroxine  50 mcg Oral Q0600  . mouth rinse  15 mL Mouth Rinse q12n4p  . multivitamin with minerals  1 tablet Oral Daily  . omega-3 acid ethyl esters  1 g Oral Daily  . rosuvastatin  10 mg Oral Daily  . torsemide  20 mg Oral BID  . zinc sulfate  220 mg Oral Daily   Continuous Infusions: . piperacillin-tazobactam (ZOSYN)  IV 3.375 g (11/29/20 0330)  . remdesivir 100 mg in NS 100  mL 100 mg (11/28/20 0907)  . vancomycin 1,500 mg (11/28/20 2357)   PRN Meds:.acetaminophen **OR** [DISCONTINUED] acetaminophen, albuterol, chlorpheniramine-HYDROcodone, guaiFENesin-dextromethorphan, magnesium hydroxide, morphine injection, nitroGLYCERIN, [DISCONTINUED] ondansetron **OR** ondansetron (ZOFRAN) IV, traMADol, traZODone  Antibiotics  :    Anti-infectives (From admission, onward)   Start     Dose/Rate Route Frequency Ordered Stop   11/27/20 0000  vancomycin (VANCOREADY) IVPB 1500 mg/300 mL        1,500 mg 150 mL/hr over 120 Minutes Intravenous Every 48 hours 11/25/20 0607     11/26/20 1000  remdesivir 100 mg in sodium chloride 0.9 % 100 mL IVPB       "Followed by" Linked Group Details   100 mg 200 mL/hr over 30 Minutes Intravenous Daily 11/25/20 0550 11/30/20 0959   11/25/20 1200  piperacillin-tazobactam (ZOSYN) IVPB 3.375 g        3.375 g 12.5 mL/hr over 240 Minutes Intravenous Every 8 hours 11/25/20 0555     11/25/20 0800  remdesivir 200 mg in sodium chloride 0.9% 250 mL IVPB       "Followed by" Linked Group Details   200 mg 580 mL/hr over 30 Minutes Intravenous Once 11/25/20 0550 11/25/20 0925   11/25/20 0615  vancomycin (VANCOREADY) IVPB 1000 mg/200 mL  Status:  Discontinued        1,000 mg 200 mL/hr over 60 Minutes Intravenous  Once 11/25/20 0520 11/25/20 0530   11/25/20 0600  piperacillin-tazobactam (ZOSYN) IVPB 3.375 g        3.375 g 100 mL/hr over 30 Minutes Intravenous  Once 11/25/20 0520 11/25/20 0855       Time Spent in minutes  [place]  DO on 11/29/2020 at 7:42 AM Pager: Secure chat  [facility] -  Office  [phone]    Objective:   Vitals:   11/28/20 2050 11/28/20 2105 11/29/20 0024 11/29/20 0626  BP: (!) 145/58 (!) 145/62  (!) 143/60  Pulse: 61 60 85 62  Resp: 11 14 18 19   Temp: (!) 97 F (36.1 C) (!) 97 F (36.1 C)  97.8 F (36.6 C)  TempSrc:    Axillary  SpO2: 97% 98% 95% 99%  Weight:      Height:        Wt  Readings from Last 3 Encounters:  11/25/20 121.8 kg  11/24/20 117.9 kg  10/16/20 120.7 kg     Intake/Output Summary (Last 24 hours) at 11/29/2020 0742 Last data filed at 11/29/2020 0330 Gross per 24 hour  Intake 1292.77 ml  Output 2050 ml  Net -757.23 ml     Physical Exam  Awake Alert, No new F.N deficits, Normal affect [place].AT,PERRAL Supple Neck,No JVD, No cervical lymphadenopathy appriciated.  Symmetrical Chest wall movement, Good air movement bilaterally, CTAB RRR,No Gallops, Rubs or new Murmurs, No Parasternal Heave Abdomen- +ve B.Sounds, Abd Soft, No tenderness, No organomegaly appriciated, No rebound - guarding or rigidity. Left lower extremity dressing clean dry and intact    Data Review:    CBC Recent Labs  Lab 11/25/20 0554 11/26/20 0130 11/27/20 0026 11/28/20 0123 11/29/20 0029  WBC 9.1 7.9 8.3 6.3 7.2  HGB 12.4 11.4* 11.0* 11.4* 12.9  HCT 39.0 34.6* 34.3* 33.6* 38.8  PLT 138* 137* 147* 157 169  MCV 97.7 95.8 96.1 94.4 94.9  MCH 31.1 31.6 30.8 32.0 31.5  MCHC 31.8 32.9 32.1 33.9 33.2  RDW 13.2 13.3 13.2 13.2 13.1  LYMPHSABS 1.5 1.2 1.5 1.4 1.0  MONOABS 0.7 0.6 0.7 0.7 0.2  EOSABS 0.0 0.1 0.1 0.1 0.0  BASOSABS 0.0 0.0 0.0 0.0 0.0    Recent Labs  Lab 11/25/20 0554 11/26/20 0130 11/26/20 0133 11/27/20 0026 11/28/20 0123 11/28/20 1041 11/29/20 0029  NA 138 137  --  139 140  --  140  K 3.8 4.2  --  4.1 3.7  --  4.3  CL 98 99  --  101 101  --  102  CO2 26 28  --  29 27  --  25  GLUCOSE 213* 157*  --  104* 127*  --  146*  BUN 60* 56*  --  53* 48*  --  42*  CREATININE 2.12* 2.27*  --  2.58* 2.22*  --  1.81*  CALCIUM 8.8* 9.1  --  8.8* 9.0  --  9.2  AST 22 22  --  18 18  --  22  ALT 24 23  --  20 21  --  19  ALKPHOS 70 67  --  63 60  --  67  BILITOT 0.8 0.7  --  0.7 0.7  --  0.5  ALBUMIN 3.4* 3.0*  --  2.8* 3.0*  --  3.3*  MG 2.3 2.6*  --  2.4 2.2  --  2.1  CRP 7.6* 12.7*  --  10.5* 9.5*  --  8.1*  DDIMER 1.54* 1.22*  --  1.27* 1.72*  --  1.54*   PROCALCITON 0.17 0.21  --  0.13 0.10  --  <0.10  INR  --   --   --   --   --  1.2  --   HGBA1C 8.1*  --   --   --   --   --   --   BNP 304.7*  --  92.8 101.3* 164.0*  --  230.9*    ------------------------------------------------------------------------------------------------------------------ No results for input(s): CHOL, HDL, LDLCALC, TRIG, CHOLHDL, LDLDIRECT in the last 72 hours.  Lab Results  Component Value Date   HGBA1C 8.1 (H) 11/25/2020   ------------------------------------------------------------------------------------------------------------------ No results for input(s): TSH, T4TOTAL, T3FREE, THYROIDAB in the last 72 hours.  Invalid input(s): FREET3  Cardiac Enzymes No  results for input(s): CKMB, TROPONINI, MYOGLOBIN in the last 168 hours.  Invalid input(s): CK ------------------------------------------------------------------------------------------------------------------    Component Value Date/Time   BNP 230.9 (H) 11/29/2020 0029    Micro Results Recent Results (from the past 240 hour(s))  Aerobic Culture w Gram Stain (superficial specimen)     Status: None (Preliminary result)   Collection Time: 11/24/20  8:01 PM   Specimen: Foot; Wound  Result Value Ref Range Status   Specimen Description   Final    FOOT Performed at [facility], [address] [place], [place] [address]    Special Requests   Final    Normal Performed at [facility], [place], [place] [address]    Gram Stain   Final    NO WBC SEEN ABUNDANT GRAM POSITIVE COCCI RARE GRAM NEGATIVE RODS    Culture   Final    FEW PROTEUS MIRABILIS ABUNDANT STAPHYLOCOCCUS AUREUS SUSCEPTIBILITIES TO FOLLOW Performed at [facility], [place] [address] [place], [place] [address]    Report Status PENDING  Incomplete   Organism ID, Bacteria PROTEUS MIRABILIS  Final      Susceptibility   Proteus mirabilis - MIC*    AMPICILLIN <=2 SENSITIVE Sensitive      CEFAZOLIN <=4 SENSITIVE Sensitive     CEFEPIME <=0.12 SENSITIVE Sensitive     CEFTAZIDIME <=1 SENSITIVE Sensitive     CEFTRIAXONE <=0.25 SENSITIVE Sensitive     CIPROFLOXACIN <=0.25 SENSITIVE Sensitive     GENTAMICIN <=1 SENSITIVE Sensitive     IMIPENEM 2 SENSITIVE Sensitive     TRIMETH/SULFA <=20 SENSITIVE Sensitive     AMPICILLIN/SULBACTAM <=2 SENSITIVE Sensitive     PIP/TAZO <=4 SENSITIVE Sensitive     * FEW PROTEUS MIRABILIS  Blood culture (routine x 2)     Status: None (Preliminary result)   Collection Time: 11/24/20  8:07 PM   Specimen: BLOOD  Result Value Ref Range Status   Specimen Description BLOOD RIGHT ANTECUBITAL  Final   Special Requests   Final    BOTTLES DRAWN AEROBIC AND ANAEROBIC Blood Culture results may not be optimal due to an inadequate volume of blood received in culture bottles   Culture   Final    NO GROWTH 4 DAYS Performed at [facility], [address] [place], [place] [address]    Report Status PENDING  Incomplete  Blood culture (routine x 2)     Status: None (Preliminary result)   Collection Time: 11/24/20  8:07 PM   Specimen: BLOOD  Result Value Ref Range Status   Specimen Description BLOOD LEFT ANTECUBITAL  Final   Special Requests   Final    BOTTLES DRAWN AEROBIC AND ANAEROBIC Blood Culture results may not be optimal due to an inadequate volume of blood received in culture bottles   Culture   Final    NO GROWTH 4 DAYS Performed at [facility], [address], [place], [place] [address]    Report Status PENDING  Incomplete  Resp Panel by RT-PCR (Flu A&B, Covid) Nasopharyngeal Swab     Status: Abnormal   Collection Time: 11/24/20  8:40 PM   Specimen: Nasopharyngeal Swab; Nasopharyngeal(NP) swabs in vial transport medium  Result Value Ref Range Status   SARS Coronavirus 2 by RT PCR POSITIVE (A) NEGATIVE Final    Comment: RESULT CALLED TO, READ BACK BY AND VERIFIED WITH: Hershey RN 11/24/2020 2144 JG (NOTE) SARS-CoV-2  target nucleic acids are DETECTED.  The SARS-CoV-2 RNA  is generally detectable in upper respiratory specimens during the acute phase of infection. Positive results are indicative of the presence of the identified virus, but do not rule out bacterial infection or co-infection with other pathogens not detected by the test. Clinical correlation with patient history and other diagnostic information is necessary to determine patient infection status. The expected result is Negative.  Fact Sheet for Patients: EntrepreneurPulse.com.au  Fact Sheet for Healthcare Providers: IncredibleEmployment.be  This test is not yet approved or cleared by the [place] FDA and  has been authorized for detection and/or diagnosis of SARS-CoV-2 by FDA under an Emergency Use Authorization (EUA).  This EUA will remain in effect (meaning this test can  be used) for the duration of  the COVID-19 declaration under Section 564(b)(1) of the Act, 21 U.S.C. section 360bbb-3(b)(1), unless the authorization is terminated or revoked sooner.     Influenza A by PCR NEGATIVE NEGATIVE Final   Influenza B by PCR NEGATIVE NEGATIVE Final    Comment: (NOTE) The Xpert Xpress SARS-CoV-2/FLU/RSV plus assay is intended as an aid in the diagnosis of influenza from Nasopharyngeal swab specimens and should not be used as a sole basis for treatment. Nasal washings and aspirates are unacceptable for Xpert Xpress SARS-CoV-2/FLU/RSV testing.  Fact Sheet for Patients: EntrepreneurPulse.com.au  Fact Sheet for Healthcare Providers: IncredibleEmployment.be  This test is not yet approved or cleared by the [place] FDA and has been authorized for detection and/or diagnosis of SARS-CoV-2 by FDA under an Emergency Use Authorization (EUA). This EUA will remain in effect (meaning this test can be used) for the duration of the COVID-19 declaration under Section  564(b)(1) of the Act, 21 U.S.C. section 360bbb-3(b)(1), unless the authorization is terminated or revoked.  Performed at [facility], [address] [place], [place] [address]   MRSA PCR Screening     Status: None   Collection Time: 11/25/20  5:39 AM   Specimen: Nasal Mucosa; Nasopharyngeal  Result Value Ref Range Status   MRSA by PCR NEGATIVE NEGATIVE Final    Comment:        The GeneXpert MRSA Assay (FDA approved for NASAL specimens only), is one component of a comprehensive MRSA colonization surveillance program. It is not intended to diagnose MRSA infection nor to guide or monitor treatment for MRSA infections. Performed at [facility], [place] [address] [place], [place] [address]   Culture, blood (routine x 2)     Status: None (Preliminary result)   Collection Time: 11/25/20  6:17 AM   Specimen: BLOOD  Result Value Ref Range Status   Specimen Description BLOOD RIGHT ANTECUBITAL  Final   Special Requests AEROBIC BOTTLE ONLY Blood Culture adequate volume  Final   Culture   Final    NO GROWTH 4 DAYS Performed at [facility], [place] [address] [place], [place] [address]    Report Status PENDING  Incomplete  Culture, blood (routine x 2)     Status: None (Preliminary result)   Collection Time: 11/25/20  6:32 AM   Specimen: BLOOD LEFT HAND  Result Value Ref Range Status   Specimen Description BLOOD LEFT HAND  Final   Special Requests AEROBIC BOTTLE ONLY Blood Culture adequate volume  Final   Culture   Final    NO GROWTH 4 DAYS Performed at [facility], [place] [address], [place], [place] [address]    Report Status PENDING  Incomplete  Aerobic/Anaerobic Culture w Gram Stain (surgical/deep wound)  Status: None (Preliminary result)   Collection Time: 11/28/20  8:27 PM   Specimen: PATH Other; Body Fluid  Result Value Ref Range Status   Specimen Description WOUND LEFT FOOT  Final   Special Requests NONE  Final   Gram Stain   Final    RARE WBC  PRESENT, PREDOMINANTLY PMN RARE GRAM POSITIVE COCCI Performed at [facility], [place] [address], [place], [place] [address]    Culture PENDING  Incomplete   Report Status PENDING  Incomplete    Radiology Reports MR FOOT LEFT WO CONTRAST  Addendum Date: 11/27/2020   ADDENDUM REPORT: 11/27/2020 19:01 ADDENDUM: Request made for correction to impression. Original report dictated by Dr. Pollyann Kennedy, pt scheduled for OR in am. Impression should read cellulitis. Small fluid collection in the plantar subcutaneous fatty tissues at the level of the first MTP joint is most consistent with a small abscess (not osteomyelitis) and appears to be deep to a skin wound. Electronically Signed   By: Donavan Foil M.D.   On: 11/27/2020 19:01   Result Date: 11/27/2020 CLINICAL DATA:  Acute on chronic left great toe and foot cellulitis. EXAM: MRI OF THE LEFT FOOT WITHOUT CONTRAST TECHNIQUE: Multiplanar, multisequence MR imaging of the left foot was performed. No intravenous contrast was administered. COMPARISON:  Plain films left foot 09/28/2018. FINDINGS: Bones/Joint/Cartilage There is no marrow edema to suggest osteomyelitis. No joint effusion. Midfoot osteoarthritis is noted. Ligaments Intact. Muscles and Tendons No intramuscular fluid collection or mass. No tendon tear. Atrophy of intrinsic musculature the foot noted. Soft tissues There appears to be a skin wound on the plantar surface of the foot deep to the first MTP joint. A fluid collection measuring 0.6 cm transverse by 0.6 cm craniocaudal by 1 cm long is seen in the subjacent subcutaneous fatty tissues. Subcutaneous edema is present diffusely. IMPRESSION: Cellulitis. Small fluid collection in the plantar subcutaneous fatty tissues at the level of the first MTP joint is most consistent with an osteomyelitis and appears to be deep to a skin wound. Negative for osteomyelitis, septic joint or myositis. Electronically Signed: By: Inge Rise M.D. On: 11/27/2020  14:17   DG Chest Port 1 View  Result Date: 11/27/2020 CLINICAL DATA:  Shortness of breath, COVID-19 EXAM: PORTABLE CHEST 1 VIEW COMPARISON:  Portable exam 0637 hours compared to 11/25/2020 FINDINGS: LEFT subclavian ICD leads project over RIGHT atrium, RIGHT ventricle, and coronary sinus. Normal heart size, mediastinal contours, and pulmonary vascularity. Atherosclerotic calcification aorta. Minimal patchy infiltrate in RIGHT upper lobe above minor fissure and at RIGHT base. LEFT lung clear. No pleural effusion or pneumothorax. IMPRESSION: Minimal patchy RIGHT lung infiltrates consistent with multifocal pneumonia. Electronically Signed   By: Lavonia Dana M.D.   On: 11/27/2020 08:15   DG Chest Port 1 View  Result Date: 11/25/2020 CLINICAL DATA:  COVID-19. EXAM: PORTABLE CHEST 1 VIEW COMPARISON:  October 02, 2020 FINDINGS: No pneumothorax. The AICD device is stable. The cardiomediastinal silhouette is stable. The left lung is clear. Minimal opacity seen in the right base. No other acute abnormalities. IMPRESSION: 1. Minimal opacity in the right base could represent atelectasis or early infiltrate. Recommend attention on follow-up. No other acute abnormalities. Electronically Signed   By: Dorise Bullion III M.D   On: 11/25/2020 09:48   MM 3D SCREEN BREAST BILATERAL  Result Date: 11/09/2020 CLINICAL DATA:  Screening. EXAM: DIGITAL SCREENING BILATERAL MAMMOGRAM WITH TOMO AND CAD COMPARISON:  Previous exam(s). ACR Breast Density Category b: There are scattered areas of  fibroglandular density. FINDINGS: There are no findings suspicious for malignancy. The images were evaluated with computer-aided detection. IMPRESSION: No mammographic evidence of malignancy. A result letter of this screening mammogram will be mailed directly to the patient. RECOMMENDATION: Screening mammogram in one year. (Code:SM-B-01Y) BI-RADS CATEGORY  1: Negative. Electronically Signed   By: Dorise Bullion III M.D   On: 11/09/2020 13:22    VAS [place] LOWER EXTREMITY VENOUS (DVT)  Result Date: 11/28/2020  Lower Venous DVT Study Indications: Rapidly rising d-dimer in covid patient.  Comparison Study: No previous exams. Performing Technologist: Rogelia Rohrer  Examination Guidelines: A complete evaluation includes B-mode imaging, spectral Doppler, color Doppler, and power Doppler as needed of all accessible portions of each vessel. Bilateral testing is considered an integral part of a complete examination. Limited examinations for reoccurring indications may be performed as noted. The reflux portion of the exam is performed with the patient in reverse Trendelenburg.  +---------+---------------+---------+-----------+----------+--------------+ RIGHT    CompressibilityPhasicitySpontaneityPropertiesThrombus Aging +---------+---------------+---------+-----------+----------+--------------+ CFV      Full           Yes      Yes                                 +---------+---------------+---------+-----------+----------+--------------+ SFJ      Full                                                        +---------+---------------+---------+-----------+----------+--------------+ FV Prox  Full           Yes      Yes                                 +---------+---------------+---------+-----------+----------+--------------+ FV Mid   Full           Yes      Yes                                 +---------+---------------+---------+-----------+----------+--------------+ FV DistalFull           Yes      Yes                                 +---------+---------------+---------+-----------+----------+--------------+ PFV      Full                                                        +---------+---------------+---------+-----------+----------+--------------+ POP      Full           Yes      Yes                                 +---------+---------------+---------+-----------+----------+--------------+ PTV      Full                                                         +---------+---------------+---------+-----------+----------+--------------+  PERO     Full                                                        +---------+---------------+---------+-----------+----------+--------------+   +---------+---------------+---------+-----------+----------+--------------+ LEFT     CompressibilityPhasicitySpontaneityPropertiesThrombus Aging +---------+---------------+---------+-----------+----------+--------------+ CFV      Full           Yes      Yes                                 +---------+---------------+---------+-----------+----------+--------------+ SFJ      Full                                                        +---------+---------------+---------+-----------+----------+--------------+ FV Prox  Full           Yes      Yes                                 +---------+---------------+---------+-----------+----------+--------------+ FV Mid   Full           Yes      Yes                                 +---------+---------------+---------+-----------+----------+--------------+ FV DistalFull           Yes      Yes                                 +---------+---------------+---------+-----------+----------+--------------+ PFV      Full                                                        +---------+---------------+---------+-----------+----------+--------------+ POP      Full           Yes      Yes                                 +---------+---------------+---------+-----------+----------+--------------+ PTV      Full                                                        +---------+---------------+---------+-----------+----------+--------------+ PERO     Full                                                        +---------+---------------+---------+-----------+----------+--------------+  Summary: BILATERAL: - No evidence of deep vein thrombosis seen in  the lower extremities, bilaterally. - No evidence of superficial venous thrombosis in the lower extremities, bilaterally. -No evidence of popliteal cyst, bilaterally.   *See table(s) above for measurements and observations.    Preliminary

## 2020-11-29 NOTE — Progress Notes (Signed)
Placed patient on CPAP for the night via auto-mode.  

## 2020-11-29 NOTE — Progress Notes (Signed)
Inpatient Diabetes Program Recommendations  AACE/ADA: New Consensus Statement on Inpatient Glycemic Control (2015)  Target Ranges:  Prepandial:   less than 140 mg/dL      Peak postprandial:   less than 180 mg/dL (1-2 hours)      Critically ill patients:  140 - 180 mg/dL   Lab Results  Component Value Date   GLUCAP 276 (H) 11/29/2020   HGBA1C 8.1 (H) 11/25/2020    Review of Glycemic Control Results for REI, CONTEE (MRN [id number]) as of 11/29/2020 13:14  Ref. Range 11/29/2020 07:44 11/29/2020 12:38  Glucose-Capillary Latest Ref Range: 70 - 99 mg/dL 225 (H) 276 (H)   Diabetes history: DM2  Outpatient Diabetes medications: Lantus 22 units QD, Novolog 5 units BID, Jardiance 10 mg QD  Current orders for Inpatient glycemic control: Lantus 30 units daily, Novolog 0-20 units Q6H, Jardiance 10 mg daily  Inpatient Diabetes Program Recommendations:    -Carb modified diet -Novolog 0-20 units TID and 0-5 units QHS -Novolog 3 units TID with meals  Will continue to follow while inpatient.  Thank you, Reche Dixon, RN, BSN Diabetes Coordinator Inpatient Diabetes Program [phone] (team pager from 8a-5p)

## 2020-11-30 ENCOUNTER — Encounter (HOSPITAL_COMMUNITY): Payer: PPO

## 2020-11-30 ENCOUNTER — Telehealth: Payer: Self-pay | Admitting: Podiatry

## 2020-11-30 LAB — BASIC METABOLIC PANEL WITH GFR
Anion gap: 14 (ref 5–15)
BUN: 56 mg/dL — ABNORMAL HIGH (ref 8–23)
CO2: 25 mmol/L (ref 22–32)
Calcium: 9 mg/dL (ref 8.9–10.3)
Chloride: 101 mmol/L (ref 98–111)
Creatinine, Ser: 2.43 mg/dL — ABNORMAL HIGH (ref 0.44–1.00)
GFR, Estimated: 21 mL/min — ABNORMAL LOW
Glucose, Bld: 106 mg/dL — ABNORMAL HIGH (ref 70–99)
Potassium: 4.5 mmol/L (ref 3.5–5.1)
Sodium: 140 mmol/L (ref 135–145)

## 2020-11-30 LAB — CULTURE, BLOOD (ROUTINE X 2)
Culture: NO GROWTH
Culture: NO GROWTH
Special Requests: ADEQUATE
Special Requests: ADEQUATE

## 2020-11-30 LAB — CBC
HCT: 35.9 % — ABNORMAL LOW (ref 36.0–46.0)
Hemoglobin: 11.7 g/dL — ABNORMAL LOW (ref 12.0–15.0)
MCH: 31.8 pg (ref 26.0–34.0)
MCHC: 32.6 g/dL (ref 30.0–36.0)
MCV: 97.6 fL (ref 80.0–100.0)
Platelets: 183 10*3/uL (ref 150–400)
RBC: 3.68 MIL/uL — ABNORMAL LOW (ref 3.87–5.11)
RDW: 13.3 % (ref 11.5–15.5)
WBC: 7.6 10*3/uL (ref 4.0–10.5)
nRBC: 0 % (ref 0.0–0.2)

## 2020-11-30 LAB — PROCALCITONIN: Procalcitonin: <0.1 ng/mL

## 2020-11-30 LAB — GLUCOSE, CAPILLARY
Glucose-Capillary: 115 mg/dL — ABNORMAL HIGH (ref 70–99)
Glucose-Capillary: 94 mg/dL (ref 70–99)
Glucose-Capillary: 211 mg/dL — ABNORMAL HIGH (ref 70–99)

## 2020-11-30 MED ORDER — TRAMADOL HCL 50 MG PO TABS: 50.0000 mg | ORAL_TABLET | Freq: Four times a day (QID) | ORAL | 0 refills | Status: AC | PRN

## 2020-11-30 MED ORDER — CEPHALEXIN 500 MG PO CAPS
500.0000 mg | ORAL_CAPSULE | Freq: Two times a day (BID) | ORAL | Status: DC
  Administered 2020-11-30: 500 mg via ORAL
  Filled 2020-11-30: qty 1

## 2020-11-30 MED ORDER — TORSEMIDE 20 MG PO TABS: 20.0000 mg | ORAL_TABLET | Freq: Two times a day (BID) | ORAL | 0 refills | Status: DC

## 2020-11-30 MED ORDER — CEPHALEXIN 500 MG PO CAPS: 500.0000 mg | ORAL_CAPSULE | Freq: Two times a day (BID) | ORAL | 0 refills | Status: DC

## 2020-11-30 NOTE — Care Management Important Message (Signed)
Important Message  Patient Details  Name: [name] MRN: [id number] Date of Birth: [birth year]   Medicare Important Message Given:  Yes - Important Message mailed due to current National Emergency   Verbal consent obtained due to current National Emergency  Relationship to patient: Self Contact Name: Nelda Luckey Call Date: 11/30/20  Time: 1205 Phone: [phone] Outcome: No Answer/Busy Important Message mailed to: Patient address on file    Delorse Lek 11/30/2020, 12:06 PM

## 2020-11-30 NOTE — Anesthesia Postprocedure Evaluation (Signed)
Anesthesia Post Note  Patient: [name]  Procedure(s) Performed: IRRIGATION AND DEBRIDEMENT OF FOOT (Left Foot)     Patient location during evaluation: PACU Anesthesia Type: General Level of consciousness: awake Pain management: pain level controlled Vital Signs Assessment: post-procedure vital signs reviewed and stable Respiratory status: spontaneous breathing Cardiovascular status: stable Postop Assessment: adequate PO intake Anesthetic complications: no   No complications documented.  Last Vitals:  Vitals:   11/29/20 2012 11/30/20 0601  BP: (!) 118/47 112/65  Pulse: 60 67  Resp: 16 18  Temp: 36.7 C 36.6 C  SpO2: 100% 99%    Last Pain:  Vitals:   11/30/20 0601  TempSrc: Axillary  PainSc:                  Tish Begin

## 2020-11-30 NOTE — TOC Transition Note (Addendum)
Transition of Care [facility]) - CM/SW Discharge Note   Patient Details  Name: [name] MRN: [id number] Date of Birth: 1955-11-10  [facility] CM/SW Contact:  Ninfa Meeker, RN Phone Number: 11/30/2020, 12:03 PM   Clinical Narrative:   Case Manager spoke with patient concerning need for [facility] at discharge. Discussed [place] agencies. Referral called to Adela Lank, [facility] Liaison. Patient will have family support at discharge. Her cell#:[phone]. No DME needs. [name] will be going to her mom's home for recovery: [place], [place]. 12:21pm CM notified by Tommi Rumps that [facility] doesn't have RN for wound care and cannot provide service. CM updated patient, will try [facility], referral called to [facility], waiting for confirmation or denial. CM will contact patient with Home Health agency when arranged.   12:30pm CM notified by Ramond Marrow that [facility] cannot provide service, neither can [facility], [facility] or [facility]. CM asked Ramond Marrow with [facility] to check and see if patient's going to her mom's home which is in a different area will make a difference, waiting for response. Case Manager has notified Podiatrist, [name].   2:52pm CM notified by Ramond Marrow that [facility] can not provide service. Case Manager has placed call to [facility] and is waiting for return call. CM spoke with  patient concerning difficulty in locating [facility] agency, we discussed her doing wound care herself and should she have any concerns she is to contact Dr. Leigh [place] office, and she voiced understanding and agreement.   Final next level of care: Home w Home Health Services Barriers to Discharge: No Barriers Identified   Patient Goals and CMS Choice   CMS Medicare.gov Compare Post Acute Care list provided to:: Patient Choice offered to / list presented to : Patient  Discharge Placement                       Discharge Plan and Services   Discharge  Planning Services: CM Consult Post Acute Care Choice: Home Health            DME Agency: NA       HH Arranged: RN [facility] Agency: [place] Date [facility] Agency Contacted: 11/30/20 Time [place]: 1202    Social Determinants of Health (SDOH) Interventions     Readmission Risk Interventions Readmission Risk Prevention Plan 04/02/2019 02/18/2019  Transportation Screening Complete Complete  Medication Review Press photographer) Complete Complete  PCP or Specialist appointment within 3-5 days of discharge Complete Complete  HRI or [place] Complete Complete  SW Recovery Care/Counseling Consult Patient refused Patient refused  Palliative Care Screening Not Applicable Not [place] Not Applicable Not Applicable  Some recent data might be hidden

## 2020-11-30 NOTE — Telephone Encounter (Signed)
Patient Is scheduled for discharge and [name] has requested follow up appointment, LVM for patient to return call to get follow up appointment scheduled

## 2020-11-30 NOTE — Progress Notes (Signed)
Patient discharging home. Vital signs stable at time of discharge as reflected in discharge summary. Discharge instructions given and verbal understanding returned. Patient discharging with post op Danco shoe and dressing supplies. Patient to follow up with PCP within a week.

## 2020-11-30 NOTE — Discharge Summary (Signed)
Physician Discharge Summary  [name] YSA:[id number] DOB: Dec 09, 1955 DOA: 11/25/2020  PCP: [name], MD  Admit date: 11/25/2020 Discharge date: 11/30/2020  Admitted From: Home Disposition: Home  Recommendations for Outpatient Follow-up:  1. Follow up with PCP in 1-2 weeks 2. Please obtain BMP/CBC in one week 3. Please follow up with podiatry for outpatient wound care and postop evaluation  Home Health: Nursing Equipment/Devices: Walking boot  Discharge Condition: Stable CODE STATUS: Full Diet recommendation: Low-salt low-fat low-carb diet  Brief/Interim Summary: 65 y.o.Caucasian femalewith medical history significant forcoronary artery disease, hypertension, obstructive sleep apnea, type 2 diabetes mellitus, systolic CHF status post AICD due to episodes of V. tach in the past and previous history of osteomyelitis of the ankle and foot and depression, who presented to the emergency room with acute onset of worsening left foot swelling with erythema for the last month with left foot wound which has been followed by podiatry, on initial x-rays there was evidence of some subcutaneous air in the left foot and she was transferred from [facility] to [facility] for AICD compatible MRI.  MRI of patient's lower extremity was noted to have osteomyelitis of the left foot -status post I&D and debridement of left foot with Dr. Earleen Newport podiatry.  Patient tolerated procedure well, based on culture and sensitivities will transition to Keflex twice daily at discharge as discussed, close follow-up with podiatry next week at their office as scheduled.  Patient otherwise will bandage changes per surgery at home with home health nursing.  Patient discharged with walking boot to ensure safe ambulation, PT agreeable for discharge home given patient's marked improvement, pain currently well controlled on tramadol, will continue the same at discharge at home.   Discharge Diagnoses:  Decubitus ulcer left  foot, cellulitis, abscess   Discharge Instructions  Discharge Instructions    Call MD for:  redness, tenderness, or signs of infection (pain, swelling, redness, odor or green/yellow discharge around incision site)   Complete by: As directed    Call MD for:  severe uncontrolled pain   Complete by: As directed    Call MD for:  temperature >100.4   Complete by: As directed    Diet - low sodium heart healthy   Complete by: As directed    Discharge wound care:   Complete by: As directed    Per surgeon   Increase activity slowly   Complete by: As directed      Allergies as of 11/30/2020   No Known Allergies     Medication List    STOP taking these medications   losartan 25 MG tablet Commonly known as: COZAAR     TAKE these medications   amiodarone 200 MG tablet Commonly known as: PACERONE Take 1 tablet (200 mg total) by mouth daily.   calcitRIOL 0.25 MCG capsule Commonly known as: ROCALTROL Take 0.25 mcg by mouth daily.   Calcium Carbonate-Vitamin D3 600-400 MG-UNIT Tabs Take 1 tablet by mouth 2 (two) times a day.   carvedilol 3.125 MG tablet Commonly known as: COREG Take 3.125 mg by mouth daily.   cephALEXin 500 MG capsule Commonly known as: KEFLEX Take 1 capsule (500 mg total) by mouth 2 (two) times daily for 10 days.   clopidogrel 75 MG tablet Commonly known as: PLAVIX TAKE 1 TABLET BY MOUTH ONCE DAILY What changed: when to take this   DULoxetine 30 MG capsule Commonly known as: Cymbalta Take 1 capsule (30 mg total) by mouth at bedtime.   empagliflozin 10  MG Tabs tablet Commonly known as: Jardiance Take 1 tablet (10 mg total) by mouth daily before breakfast.   ferrous sulfate 325 (65 FE) MG tablet Take 325 mg by mouth 2 (two) times daily with a meal.   Fish Oil 1000 MG Caps Take 1 capsule by mouth 2 (two) times a day.   gabapentin 600 MG tablet Commonly known as: NEURONTIN TAKE 1 TABLET BY IN THE MORNING AND 2 ATBEDTIME What changed: See the new  instructions.   isosorbide mononitrate 30 MG 24 hr tablet Commonly known as: IMDUR Take 1 tablet (30 mg total) by mouth daily. Needs appt   Lantus SoloStar 100 UNIT/ML Solostar Pen Generic drug: insulin glargine INJECT 22 UNITS UNDER THE SKIN DAILY. What changed: See the new instructions.   levothyroxine 50 MCG tablet Commonly known as: SYNTHROID TAKE 1 TABLET BY MOUTH ONCE DAILY BEFOREBREAKFAST What changed: See the new instructions.   multivitamin tablet Take 1 tablet by mouth daily.   nitroGLYCERIN 0.4 MG SL tablet Commonly known as: NITROSTAT Place 1 tablet (0.4 mg total) under the tongue every 5 (five) minutes as needed for chest pain.   NovoLOG FlexPen 100 UNIT/ML FlexPen Generic drug: insulin aspart Inject 5 Units into the skin 2 (two) times daily with a meal.   OneTouch Ultra test strip Generic drug: glucose blood UUD TO CHECK BLOOD SUGAR 3 TIMES DAILY   Pen Needles 32G X 6 MM Misc 1 each by Does not apply route 4 (four) times daily.   rosuvastatin 10 MG tablet Commonly known as: CRESTOR TAKE 1 TABLET BY MOUTH ONCE DAILY What changed: when to take this   torsemide 20 MG tablet Commonly known as: DEMADEX Take 1 tablet (20 mg total) by mouth 2 (two) times daily. What changed: See the new instructions.   traMADol 50 MG tablet Commonly known as: ULTRAM Take 1 tablet (50 mg total) by mouth every 6 (six) hours as needed for up to 3 days for moderate pain or severe pain. What changed:   how much to take  when to take this  reasons to take this            Discharge Care Instructions  (From admission, onward)         Start     Ordered   11/30/20 0000  Discharge wound care:       Comments: Per surgeon   11/30/20 1029          No Known Allergies  Consultations: Podiatry, Dr. Earleen Newport  Procedures/Studies: MR FOOT LEFT WO CONTRAST  Addendum Date: 11/27/2020   ADDENDUM REPORT: 11/27/2020 19:01 ADDENDUM: Request made for correction to  impression. Original report dictated by Dr. Pollyann Kennedy, pt scheduled for OR in am. Impression should read cellulitis. Small fluid collection in the plantar subcutaneous fatty tissues at the level of the first MTP joint is most consistent with a small abscess (not osteomyelitis) and appears to be deep to a skin wound. Electronically Signed   By: Donavan Foil M.D.   On: 11/27/2020 19:01   Result Date: 11/27/2020 CLINICAL DATA:  Acute on chronic left great toe and foot cellulitis. EXAM: MRI OF THE LEFT FOOT WITHOUT CONTRAST TECHNIQUE: Multiplanar, multisequence MR imaging of the left foot was performed. No intravenous contrast was administered. COMPARISON:  Plain films left foot 09/28/2018. FINDINGS: Bones/Joint/Cartilage There is no marrow edema to suggest osteomyelitis. No joint effusion. Midfoot osteoarthritis is noted. Ligaments Intact. Muscles and Tendons No intramuscular fluid collection or mass. No tendon tear.  Atrophy of intrinsic musculature the foot noted. Soft tissues There appears to be a skin wound on the plantar surface of the foot deep to the first MTP joint. A fluid collection measuring 0.6 cm transverse by 0.6 cm craniocaudal by 1 cm long is seen in the subjacent subcutaneous fatty tissues. Subcutaneous edema is present diffusely. IMPRESSION: Cellulitis. Small fluid collection in the plantar subcutaneous fatty tissues at the level of the first MTP joint is most consistent with an osteomyelitis and appears to be deep to a skin wound. Negative for osteomyelitis, septic joint or myositis. Electronically Signed: By: Inge Rise M.D. On: 11/27/2020 14:17   DG Chest Port 1 View  Result Date: 11/27/2020 CLINICAL DATA:  Shortness of breath, COVID-19 EXAM: PORTABLE CHEST 1 VIEW COMPARISON:  Portable exam 0637 hours compared to 11/25/2020 FINDINGS: LEFT subclavian ICD leads project over RIGHT atrium, RIGHT ventricle, and coronary sinus. Normal heart size, mediastinal contours, and pulmonary  vascularity. Atherosclerotic calcification aorta. Minimal patchy infiltrate in RIGHT upper lobe above minor fissure and at RIGHT base. LEFT lung clear. No pleural effusion or pneumothorax. IMPRESSION: Minimal patchy RIGHT lung infiltrates consistent with multifocal pneumonia. Electronically Signed   By: Lavonia Dana M.D.   On: 11/27/2020 08:15   DG Chest Port 1 View  Result Date: 11/25/2020 CLINICAL DATA:  COVID-19. EXAM: PORTABLE CHEST 1 VIEW COMPARISON:  October 02, 2020 FINDINGS: No pneumothorax. The AICD device is stable. The cardiomediastinal silhouette is stable. The left lung is clear. Minimal opacity seen in the right base. No other acute abnormalities. IMPRESSION: 1. Minimal opacity in the right base could represent atelectasis or early infiltrate. Recommend attention on follow-up. No other acute abnormalities. Electronically Signed   By: Dorise Bullion III M.D   On: 11/25/2020 09:48   MM 3D SCREEN BREAST BILATERAL  Result Date: 11/09/2020 CLINICAL DATA:  Screening. EXAM: DIGITAL SCREENING BILATERAL MAMMOGRAM WITH TOMO AND CAD COMPARISON:  Previous exam(s). ACR Breast Density Category b: There are scattered areas of fibroglandular density. FINDINGS: There are no findings suspicious for malignancy. The images were evaluated with computer-aided detection. IMPRESSION: No mammographic evidence of malignancy. A result letter of this screening mammogram will be mailed directly to the patient. RECOMMENDATION: Screening mammogram in one year. (Code:SM-B-01Y) BI-RADS CATEGORY  1: Negative. Electronically Signed   By: Dorise Bullion III M.D   On: 11/09/2020 13:22   VAS [place] LOWER EXTREMITY VENOUS (DVT)  Result Date: 11/29/2020  Lower Venous DVT Study Indications: Rapidly rising d-dimer in covid patient.  Comparison Study: No previous exams. Performing Technologist: Rogelia Rohrer  Examination Guidelines: A complete evaluation includes B-mode imaging, spectral Doppler, color Doppler, and power Doppler as needed  of all accessible portions of each vessel. Bilateral testing is considered an integral part of a complete examination. Limited examinations for reoccurring indications may be performed as noted. The reflux portion of the exam is performed with the patient in reverse Trendelenburg.  +---------+---------------+---------+-----------+----------+--------------+ RIGHT    CompressibilityPhasicitySpontaneityPropertiesThrombus Aging +---------+---------------+---------+-----------+----------+--------------+ CFV      Full           Yes      Yes                                 +---------+---------------+---------+-----------+----------+--------------+ SFJ      Full                                                        +---------+---------------+---------+-----------+----------+--------------+  FV Prox  Full           Yes      Yes                                 +---------+---------------+---------+-----------+----------+--------------+ FV Mid   Full           Yes      Yes                                 +---------+---------------+---------+-----------+----------+--------------+ FV DistalFull           Yes      Yes                                 +---------+---------------+---------+-----------+----------+--------------+ PFV      Full                                                        +---------+---------------+---------+-----------+----------+--------------+ POP      Full           Yes      Yes                                 +---------+---------------+---------+-----------+----------+--------------+ PTV      Full                                                        +---------+---------------+---------+-----------+----------+--------------+ PERO     Full                                                        +---------+---------------+---------+-----------+----------+--------------+    +---------+---------------+---------+-----------+----------+--------------+ LEFT     CompressibilityPhasicitySpontaneityPropertiesThrombus Aging +---------+---------------+---------+-----------+----------+--------------+ CFV      Full           Yes      Yes                                 +---------+---------------+---------+-----------+----------+--------------+ SFJ      Full                                                        +---------+---------------+---------+-----------+----------+--------------+ FV Prox  Full           Yes      Yes                                 +---------+---------------+---------+-----------+----------+--------------+ FV Mid   Full  Yes      Yes                                 +---------+---------------+---------+-----------+----------+--------------+ FV DistalFull           Yes      Yes                                 +---------+---------------+---------+-----------+----------+--------------+ PFV      Full                                                        +---------+---------------+---------+-----------+----------+--------------+ POP      Full           Yes      Yes                                 +---------+---------------+---------+-----------+----------+--------------+ PTV      Full                                                        +---------+---------------+---------+-----------+----------+--------------+ PERO     Full                                                        +---------+---------------+---------+-----------+----------+--------------+     Summary: BILATERAL: - No evidence of deep vein thrombosis seen in the lower extremities, bilaterally. - No evidence of superficial venous thrombosis in the lower extremities, bilaterally. -No evidence of popliteal cyst, bilaterally.   *See table(s) above for measurements and observations. Electronically signed by Curt Jews MD on 11/29/2020 at  2:58:45 PM.    Final       Subjective: No acute issues or events overnight denies nausea vomiting diarrhea constipation headache fevers or chills   Discharge Exam: Vitals:   11/29/20 2012 11/30/20 0601  BP: (!) 118/47 112/65  Pulse: 60 67  Resp: 16 18  Temp: 98.1 F (36.7 C) 97.9 F (36.6 C)  SpO2: 100% 99%   Vitals:   11/29/20 0626 11/29/20 1242 11/29/20 2012 11/30/20 0601  BP: (!) 143/60 110/63 (!) 118/47 112/65  Pulse: 62 65 60 67  Resp: 19 17 16 18   Temp: 97.8 F (36.6 C) 98 F (36.7 C) 98.1 F (36.7 C) 97.9 F (36.6 C)  TempSrc: Axillary Oral Axillary Axillary  SpO2: 99% 98% 100% 99%  Weight:      Height:        General: Pt is alert, awake, not in acute distress Cardiovascular: RRR, S1/S2 +, no rubs, no gallops Respiratory: CTA bilaterally, no wheezing, no rhonchi Abdominal: Soft, NT, ND, bowel sounds + Extremities: no edema, no cyanosis -left foot bandage clean dry intact with intact sensation distally    The results of significant diagnostics from this hospitalization (including imaging, microbiology, ancillary and laboratory)  are listed below for reference.     Microbiology: Recent Results (from the past 240 hour(s))  Aerobic Culture w Gram Stain (superficial specimen)     Status: None   Collection Time: 11/24/20  8:01 PM   Specimen: Foot; Wound  Result Value Ref Range Status   Specimen Description   Final    FOOT Performed at [facility], [address] [place], [place] [address]    Special Requests   Final    Normal Performed at [facility], [place], [place] [address]    Gram Stain   Final    NO WBC SEEN ABUNDANT GRAM POSITIVE COCCI RARE GRAM NEGATIVE RODS Performed at [facility], [place] [address], [place], [place] [address]    Culture   Final    FEW PROTEUS MIRABILIS ABUNDANT STAPHYLOCOCCUS AUREUS    Report Status 11/29/2020 FINAL  Final   Organism ID, Bacteria PROTEUS MIRABILIS  Final    Organism ID, Bacteria STAPHYLOCOCCUS AUREUS  Final      Susceptibility   Proteus mirabilis - MIC*    AMPICILLIN <=2 SENSITIVE Sensitive     CEFAZOLIN <=4 SENSITIVE Sensitive     CEFEPIME <=0.12 SENSITIVE Sensitive     CEFTAZIDIME <=1 SENSITIVE Sensitive     CEFTRIAXONE <=0.25 SENSITIVE Sensitive     CIPROFLOXACIN <=0.25 SENSITIVE Sensitive     GENTAMICIN <=1 SENSITIVE Sensitive     IMIPENEM 2 SENSITIVE Sensitive     TRIMETH/SULFA <=20 SENSITIVE Sensitive     AMPICILLIN/SULBACTAM <=2 SENSITIVE Sensitive     PIP/TAZO <=4 SENSITIVE Sensitive     * FEW PROTEUS MIRABILIS   Staphylococcus aureus - MIC*    CIPROFLOXACIN <=0.5 SENSITIVE Sensitive     ERYTHROMYCIN <=0.25 SENSITIVE Sensitive     GENTAMICIN <=0.5 SENSITIVE Sensitive     OXACILLIN 0.5 SENSITIVE Sensitive     TETRACYCLINE <=1 SENSITIVE Sensitive     VANCOMYCIN <=0.5 SENSITIVE Sensitive     TRIMETH/SULFA <=10 SENSITIVE Sensitive     CLINDAMYCIN <=0.25 SENSITIVE Sensitive     RIFAMPIN <=0.5 SENSITIVE Sensitive     Inducible Clindamycin NEGATIVE Sensitive     * ABUNDANT STAPHYLOCOCCUS AUREUS  Blood culture (routine x 2)     Status: None   Collection Time: 11/24/20  8:07 PM   Specimen: BLOOD  Result Value Ref Range Status   Specimen Description BLOOD RIGHT ANTECUBITAL  Final   Special Requests   Final    BOTTLES DRAWN AEROBIC AND ANAEROBIC Blood Culture results may not be optimal due to an inadequate volume of blood received in culture bottles   Culture   Final    NO GROWTH 5 DAYS Performed at [facility], [address] [place], [place] [address]    Report Status 11/29/2020 FINAL  Final  Blood culture (routine x 2)     Status: None   Collection Time: 11/24/20  8:07 PM   Specimen: BLOOD  Result Value Ref Range Status   Specimen Description BLOOD LEFT ANTECUBITAL  Final   Special Requests   Final    BOTTLES DRAWN AEROBIC AND ANAEROBIC Blood Culture results may not be optimal due to an inadequate volume of blood  received in culture bottles   Culture   Final    NO GROWTH 5 DAYS Performed at [facility], [address] [place], [place] [address]    Report Status 11/29/2020 FINAL  Final  Resp Panel by RT-PCR (Flu A&B, Covid) Nasopharyngeal Swab  Status: Abnormal   Collection Time: 11/24/20  8:40 PM   Specimen: Nasopharyngeal Swab; Nasopharyngeal(NP) swabs in vial transport medium  Result Value Ref Range Status   SARS Coronavirus 2 by RT PCR POSITIVE (A) NEGATIVE Final    Comment: RESULT CALLED TO, READ BACK BY AND VERIFIED WITH: [place] RN 11/24/2020 2144 JG (NOTE) SARS-CoV-2 target nucleic acids are DETECTED.  The SARS-CoV-2 RNA is generally detectable in upper respiratory specimens during the acute phase of infection. Positive results are indicative of the presence of the identified virus, but do not rule out bacterial infection or co-infection with other pathogens not detected by the test. Clinical correlation with patient history and other diagnostic information is necessary to determine patient infection status. The expected result is Negative.  Fact Sheet for Patients: EntrepreneurPulse.com.au  Fact Sheet for Healthcare Providers: IncredibleEmployment.be  This test is not yet approved or cleared by the [place] FDA and  has been authorized for detection and/or diagnosis of SARS-CoV-2 by FDA under an Emergency Use Authorization (EUA).  This EUA will remain in effect (meaning this test can  be used) for the duration of  the COVID-19 declaration under Section 564(b)(1) of the Act, 21 U.S.C. section 360bbb-3(b)(1), unless the authorization is terminated or revoked sooner.     Influenza A by PCR NEGATIVE NEGATIVE Final   Influenza B by PCR NEGATIVE NEGATIVE Final    Comment: (NOTE) The Xpert Xpress SARS-CoV-2/FLU/RSV plus assay is intended as an aid in the diagnosis of influenza from Nasopharyngeal swab specimens  and should not be used as a sole basis for treatment. Nasal washings and aspirates are unacceptable for Xpert Xpress SARS-CoV-2/FLU/RSV testing.  Fact Sheet for Patients: EntrepreneurPulse.com.au  Fact Sheet for Healthcare Providers: IncredibleEmployment.be  This test is not yet approved or cleared by the [place] FDA and has been authorized for detection and/or diagnosis of SARS-CoV-2 by FDA under an Emergency Use Authorization (EUA). This EUA will remain in effect (meaning this test can be used) for the duration of the COVID-19 declaration under Section 564(b)(1) of the Act, 21 U.S.C. section 360bbb-3(b)(1), unless the authorization is terminated or revoked.  Performed at [facility], [address] [place], [place] [address]   MRSA PCR Screening     Status: None   Collection Time: 11/25/20  5:39 AM   Specimen: Nasal Mucosa; Nasopharyngeal  Result Value Ref Range Status   MRSA by PCR NEGATIVE NEGATIVE Final    Comment:        The GeneXpert MRSA Assay (FDA approved for NASAL specimens only), is one component of a comprehensive MRSA colonization surveillance program. It is not intended to diagnose MRSA infection nor to guide or monitor treatment for MRSA infections. Performed at [facility], [place] [address] [place], [place] [address]   Culture, blood (routine x 2)     Status: None   Collection Time: 11/25/20  6:17 AM   Specimen: BLOOD  Result Value Ref Range Status   Specimen Description BLOOD RIGHT ANTECUBITAL  Final   Special Requests AEROBIC BOTTLE ONLY Blood Culture adequate volume  Final   Culture   Final    NO GROWTH 5 DAYS Performed at [facility], [place] [address] [place], [place] [address]    Report Status 11/30/2020 FINAL  Final  Culture, blood (routine x 2)     Status: None   Collection Time: 11/25/20  6:32 AM   Specimen: BLOOD LEFT HAND  Result Value Ref Range Status  Specimen  Description BLOOD LEFT HAND  Final   Special Requests AEROBIC BOTTLE ONLY Blood Culture adequate volume  Final   Culture   Final    NO GROWTH 5 DAYS Performed at [facility], [address], [place], [place] [address]    Report Status 11/30/2020 FINAL  Final  Aerobic/Anaerobic Culture w Gram Stain (surgical/deep wound)     Status: None (Preliminary result)   Collection Time: 11/28/20  8:27 PM   Specimen: PATH Other; Body Fluid  Result Value Ref Range Status   Specimen Description WOUND LEFT FOOT  Final   Special Requests NONE  Final   Gram Stain   Final    RARE WBC PRESENT, PREDOMINANTLY PMN RARE GRAM POSITIVE COCCI    Culture   Final    CULTURE REINCUBATED FOR BETTER GROWTH Performed at [facility], [place] [address], [place], [place] [address]    Report Status PENDING  Incomplete     Labs: BNP (last 3 results) Recent Labs    11/27/20 0026 11/28/20 0123 11/29/20 0029  BNP 101.3* 164.0* 650.3*   Basic Metabolic Panel: Recent Labs  Lab 11/25/20 0554 11/26/20 0130 11/27/20 0026 11/28/20 0123 11/29/20 0029 11/30/20 0204  NA 138 137 139 140 140 140  K 3.8 4.2 4.1 3.7 4.3 4.5  CL 98 99 101 101 102 101  CO2 26 28 29 27 25 25   GLUCOSE 213* 157* 104* 127* 146* 106*  BUN 60* 56* 53* 48* 42* 56*  CREATININE 2.12* 2.27* 2.58* 2.22* 1.81* 2.43*  CALCIUM 8.8* 9.1 8.8* 9.0 9.2 9.0  MG 2.3 2.6* 2.4 2.2 2.1  --    Liver Function Tests: Recent Labs  Lab 11/25/20 0554 11/26/20 0130 11/27/20 0026 11/28/20 0123 11/29/20 0029  AST 22 22 18 18 22   ALT 24 23 20 21 19   ALKPHOS 70 67 63 60 67  BILITOT 0.8 0.7 0.7 0.7 0.5  PROT 7.0 6.5 6.4* 6.8 7.4  ALBUMIN 3.4* 3.0* 2.8* 3.0* 3.3*   No results for input(s): LIPASE, AMYLASE in the last 168 hours. No results for input(s): AMMONIA in the last 168 hours. CBC: Recent Labs  Lab 11/25/20 0554 11/26/20 0130 11/27/20 0026 11/28/20 0123 11/29/20 0029 11/30/20 0204  WBC 9.1 7.9 8.3 6.3 7.2 7.6  NEUTROABS 6.9 6.0  6.0 4.1 5.9  --   HGB 12.4 11.4* 11.0* 11.4* 12.9 11.7*  HCT 39.0 34.6* 34.3* 33.6* 38.8 35.9*  MCV 97.7 95.8 96.1 94.4 94.9 97.6  PLT 138* 137* 147* 157 169 183   Cardiac Enzymes: No results for input(s): CKTOTAL, CKMB, CKMBINDEX, TROPONINI in the last 168 hours. BNP: Invalid input(s): POCBNP CBG: Recent Labs  Lab 11/29/20 1238 11/29/20 1724 11/29/20 2011 11/30/20 0138 11/30/20 0742  GLUCAP 276* 156* 192* 115* 94   D-Dimer Recent Labs    11/28/20 0123 11/29/20 0029  DDIMER 1.72* 1.54*   Hgb A1c No results for input(s): HGBA1C in the last 72 hours. Lipid Profile No results for input(s): CHOL, HDL, LDLCALC, TRIG, CHOLHDL, LDLDIRECT in the last 72 hours. Thyroid function studies No results for input(s): TSH, T4TOTAL, T3FREE, THYROIDAB in the last 72 hours.  Invalid input(s): FREET3 Anemia work up No results for input(s): VITAMINB12, FOLATE, FERRITIN, TIBC, IRON, RETICCTPCT in the last 72 hours. Urinalysis    Component Value Date/Time   COLORURINE AMBER (A) 03/09/2019 2054   APPEARANCEUR TURBID (A) 03/09/2019 2054   LABSPEC 1.017 03/09/2019 2054   PHURINE 5.0 03/09/2019 2054   GLUCOSEU  NEGATIVE 03/09/2019 2054   HGBUR LARGE (A) 03/09/2019 2054   BILIRUBINUR NEGATIVE 03/09/2019 2054   BILIRUBINUR negative 09/27/2017 1506   BILIRUBINUR neg 01/16/2015 [place] 03/09/2019 2054   PROTEINUR 100 (A) 03/09/2019 2054   UROBILINOGEN 0.2 09/27/2017 1506   UROBILINOGEN 1.0 05/17/2010 0214   NITRITE NEGATIVE 03/09/2019 2054   LEUKOCYTESUR MODERATE (A) 03/09/2019 2054   Sepsis Labs Invalid input(s): PROCALCITONIN,  WBC,  LACTICIDVEN Microbiology Recent Results (from the past 240 hour(s))  Aerobic Culture w Gram Stain (superficial specimen)     Status: None   Collection Time: 11/24/20  8:01 PM   Specimen: Foot; Wound  Result Value Ref Range Status   Specimen Description   Final    FOOT Performed at [facility], [address],  [place], [place] [address]    Special Requests   Final    Normal Performed at [facility], [place], [place] [address]    Gram Stain   Final    NO WBC SEEN ABUNDANT GRAM POSITIVE COCCI RARE GRAM NEGATIVE RODS Performed at [facility], [place] [address] [place], [place] [address]    Culture   Final    FEW PROTEUS MIRABILIS ABUNDANT STAPHYLOCOCCUS AUREUS    Report Status 11/29/2020 FINAL  Final   Organism ID, Bacteria PROTEUS MIRABILIS  Final   Organism ID, Bacteria STAPHYLOCOCCUS AUREUS  Final      Susceptibility   Proteus mirabilis - MIC*    AMPICILLIN <=2 SENSITIVE Sensitive     CEFAZOLIN <=4 SENSITIVE Sensitive     CEFEPIME <=0.12 SENSITIVE Sensitive     CEFTAZIDIME <=1 SENSITIVE Sensitive     CEFTRIAXONE <=0.25 SENSITIVE Sensitive     CIPROFLOXACIN <=0.25 SENSITIVE Sensitive     GENTAMICIN <=1 SENSITIVE Sensitive     IMIPENEM 2 SENSITIVE Sensitive     TRIMETH/SULFA <=20 SENSITIVE Sensitive     AMPICILLIN/SULBACTAM <=2 SENSITIVE Sensitive     PIP/TAZO <=4 SENSITIVE Sensitive     * FEW PROTEUS MIRABILIS   Staphylococcus aureus - MIC*    CIPROFLOXACIN <=0.5 SENSITIVE Sensitive     ERYTHROMYCIN <=0.25 SENSITIVE Sensitive     GENTAMICIN <=0.5 SENSITIVE Sensitive     OXACILLIN 0.5 SENSITIVE Sensitive     TETRACYCLINE <=1 SENSITIVE Sensitive     VANCOMYCIN <=0.5 SENSITIVE Sensitive     TRIMETH/SULFA <=10 SENSITIVE Sensitive     CLINDAMYCIN <=0.25 SENSITIVE Sensitive     RIFAMPIN <=0.5 SENSITIVE Sensitive     Inducible Clindamycin NEGATIVE Sensitive     * ABUNDANT STAPHYLOCOCCUS AUREUS  Blood culture (routine x 2)     Status: None   Collection Time: 11/24/20  8:07 PM   Specimen: BLOOD  Result Value Ref Range Status   Specimen Description BLOOD RIGHT ANTECUBITAL  Final   Special Requests   Final    BOTTLES DRAWN AEROBIC AND ANAEROBIC Blood Culture results may not be optimal due to an inadequate volume of blood received in culture bottles    Culture   Final    NO GROWTH 5 DAYS Performed at [facility], [address] [place], [place] [address]    Report Status 11/29/2020 FINAL  Final  Blood culture (routine x 2)     Status: None   Collection Time: 11/24/20  8:07 PM   Specimen: BLOOD  Result Value Ref Range Status   Specimen Description BLOOD LEFT ANTECUBITAL  Final   Special Requests   Final  BOTTLES DRAWN AEROBIC AND ANAEROBIC Blood Culture results may not be optimal due to an inadequate volume of blood received in culture bottles   Culture   Final    NO GROWTH 5 DAYS Performed at [facility], [address] [place], [place] [address]    Report Status 11/29/2020 FINAL  Final  Resp Panel by RT-PCR (Flu A&B, Covid) Nasopharyngeal Swab     Status: Abnormal   Collection Time: 11/24/20  8:40 PM   Specimen: Nasopharyngeal Swab; Nasopharyngeal(NP) swabs in vial transport medium  Result Value Ref Range Status   SARS Coronavirus 2 by RT PCR POSITIVE (A) NEGATIVE Final    Comment: RESULT CALLED TO, READ BACK BY AND VERIFIED WITH: Twin Brooks RN 11/24/2020 2144 JG (NOTE) SARS-CoV-2 target nucleic acids are DETECTED.  The SARS-CoV-2 RNA is generally detectable in upper respiratory specimens during the acute phase of infection. Positive results are indicative of the presence of the identified virus, but do not rule out bacterial infection or co-infection with other pathogens not detected by the test. Clinical correlation with patient history and other diagnostic information is necessary to determine patient infection status. The expected result is Negative.  Fact Sheet for Patients: EntrepreneurPulse.com.au  Fact Sheet for Healthcare Providers: IncredibleEmployment.be  This test is not yet approved or cleared by the [place] FDA and  has been authorized for detection and/or diagnosis of SARS-CoV-2 by FDA under an Emergency Use Authorization (EUA).  This  EUA will remain in effect (meaning this test can  be used) for the duration of  the COVID-19 declaration under Section 564(b)(1) of the Act, 21 U.S.C. section 360bbb-3(b)(1), unless the authorization is terminated or revoked sooner.     Influenza A by PCR NEGATIVE NEGATIVE Final   Influenza B by PCR NEGATIVE NEGATIVE Final    Comment: (NOTE) The Xpert Xpress SARS-CoV-2/FLU/RSV plus assay is intended as an aid in the diagnosis of influenza from Nasopharyngeal swab specimens and should not be used as a sole basis for treatment. Nasal washings and aspirates are unacceptable for Xpert Xpress SARS-CoV-2/FLU/RSV testing.  Fact Sheet for Patients: EntrepreneurPulse.com.au  Fact Sheet for Healthcare Providers: IncredibleEmployment.be  This test is not yet approved or cleared by the [place] FDA and has been authorized for detection and/or diagnosis of SARS-CoV-2 by FDA under an Emergency Use Authorization (EUA). This EUA will remain in effect (meaning this test can be used) for the duration of the COVID-19 declaration under Section 564(b)(1) of the Act, 21 U.S.C. section 360bbb-3(b)(1), unless the authorization is terminated or revoked.  Performed at [facility], [address] [place], [place] [address]   MRSA PCR Screening     Status: None   Collection Time: 11/25/20  5:39 AM   Specimen: Nasal Mucosa; Nasopharyngeal  Result Value Ref Range Status   MRSA by PCR NEGATIVE NEGATIVE Final    Comment:        The GeneXpert MRSA Assay (FDA approved for NASAL specimens only), is one component of a comprehensive MRSA colonization surveillance program. It is not intended to diagnose MRSA infection nor to guide or monitor treatment for MRSA infections. Performed at [facility], [place] [address] [place], [place] [address]   Culture, blood (routine x 2)     Status: None   Collection Time: 11/25/20  6:17 AM   Specimen: BLOOD   Result Value Ref Range Status   Specimen Description BLOOD RIGHT ANTECUBITAL  Final   Special Requests AEROBIC BOTTLE ONLY Blood Culture  adequate volume  Final   Culture   Final    NO GROWTH 5 DAYS Performed at [facility], [place] [address], [place], [place] [address]    Report Status 11/30/2020 FINAL  Final  Culture, blood (routine x 2)     Status: None   Collection Time: 11/25/20  6:32 AM   Specimen: BLOOD LEFT HAND  Result Value Ref Range Status   Specimen Description BLOOD LEFT HAND  Final   Special Requests AEROBIC BOTTLE ONLY Blood Culture adequate volume  Final   Culture   Final    NO GROWTH 5 DAYS Performed at [facility], [place] [address], [place], [place] [address]    Report Status 11/30/2020 FINAL  Final  Aerobic/Anaerobic Culture w Gram Stain (surgical/deep wound)     Status: None (Preliminary result)   Collection Time: 11/28/20  8:27 PM   Specimen: PATH Other; Body Fluid  Result Value Ref Range Status   Specimen Description WOUND LEFT FOOT  Final   Special Requests NONE  Final   Gram Stain   Final    RARE WBC PRESENT, PREDOMINANTLY PMN RARE GRAM POSITIVE COCCI    Culture   Final    CULTURE REINCUBATED FOR BETTER GROWTH Performed at [facility], [place] [address], [place], [place] [address]    Report Status PENDING  Incomplete     Time coordinating discharge: Over 30 minutes  SIGNED:   Little Ishikawa, DO [facility] 11/30/2020, 10:29 AM Pager   If 7PM-7AM, please contact night-coverage [email or web address]

## 2020-12-01 ENCOUNTER — Telehealth: Payer: Self-pay

## 2020-12-01 NOTE — Telephone Encounter (Signed)
Transition Care Management Follow-up Telephone Call  Date of discharge and from where: [facility]  How have you been since you were released from the hospital? Patient states that she is doing better.  Any questions or concerns? No  Items Reviewed:  Did the pt receive and understand the discharge instructions provided? Yes   Medications obtained and verified? Yes   Other? No   Any new allergies since your discharge? No   Dietary orders reviewed? Yes  Do you have support at home? Yes   Home Care and Equipment/Supplies: Were home health services ordered? not applicable If so, what is the name of the agency? N/A  Has the agency set up a time to come to the patient's home? not applicable Were any new equipment or medical supplies ordered?  No What is the name of the medical supply agency? N/A Were you able to get the supplies/equipment? not applicable Do you have any questions related to the use of the equipment or supplies? No  Functional Questionnaire: (I = Independent and D = Dependent) ADLs: I  Bathing/Dressing- I  Meal Prep- I  Eating- I  Maintaining continence- I  Transferring/Ambulation- I  Managing Meds- I  Follow up appointments reviewed:   PCP Hospital f/u appt confirmed? Yes  Scheduled to see Dr. Damita Dunnings on 12/05/2020 @ 12:30 pm.  [facility] f/u appt confirmed? Yes  Scheduled to see podiatry.  Are transportation arrangements needed? No   If their condition worsens, is the pt aware to call PCP or go to the Emergency Dept.? Yes  Was the patient provided with contact information for the PCP's office or ED? Yes  Was to pt encouraged to call back with questions or concerns? Yes

## 2020-12-03 LAB — AEROBIC/ANAEROBIC CULTURE W GRAM STAIN (SURGICAL/DEEP WOUND)

## 2020-12-05 ENCOUNTER — Ambulatory Visit (INDEPENDENT_AMBULATORY_CARE_PROVIDER_SITE_OTHER): Payer: PPO | Admitting: Family Medicine

## 2020-12-05 ENCOUNTER — Encounter: Payer: Self-pay | Admitting: Family Medicine

## 2020-12-05 ENCOUNTER — Other Ambulatory Visit: Payer: Self-pay

## 2020-12-05 VITALS — BP 130/64 | HR 71 | Temp 97.1°F | Ht 68.0 in | Wt 270.0 lb

## 2020-12-05 DIAGNOSIS — E1142 Type 2 diabetes mellitus with diabetic polyneuropathy: Secondary | ICD-10-CM | POA: Diagnosis not present

## 2020-12-05 DIAGNOSIS — L0390 Cellulitis, unspecified: Secondary | ICD-10-CM | POA: Diagnosis not present

## 2020-12-05 DIAGNOSIS — L97501 Non-pressure chronic ulcer of other part of unspecified foot limited to breakdown of skin: Secondary | ICD-10-CM

## 2020-12-05 DIAGNOSIS — E11621 Type 2 diabetes mellitus with foot ulcer: Secondary | ICD-10-CM

## 2020-12-05 LAB — BASIC METABOLIC PANEL
BUN: 49 mg/dL — ABNORMAL HIGH (ref 6–23)
CO2: 31 mEq/L (ref 19–32)
Calcium: 8.9 mg/dL (ref 8.4–10.5)
Chloride: 99 mEq/L (ref 96–112)
Creatinine, Ser: 2.24 mg/dL — ABNORMAL HIGH (ref 0.40–1.20)
GFR: 22.26 mL/min — ABNORMAL LOW (ref >60.00)
Glucose, Bld: 167 mg/dL — ABNORMAL HIGH (ref 70–99)
Potassium: 4.7 mEq/L (ref 3.5–5.1)
Sodium: 138 mEq/L (ref 135–145)

## 2020-12-05 LAB — CBC WITH DIFFERENTIAL/PLATELET
Basophils Absolute: 0 10*3/uL (ref 0.0–0.1)
Basophils Relative: 0.4 % (ref 0.0–3.0)
Eosinophils Absolute: 0.1 10*3/uL (ref 0.0–0.7)
Eosinophils Relative: 0.7 % (ref 0.0–5.0)
HCT: 36.8 % (ref 36.0–46.0)
Hemoglobin: 12.4 g/dL (ref 12.0–15.0)
Lymphocytes Relative: 19.1 % (ref 12.0–46.0)
Lymphs Abs: 1.6 10*3/uL (ref 0.7–4.0)
MCHC: 33.7 g/dL (ref 30.0–36.0)
MCV: 92.8 fl (ref 78.0–100.0)
Monocytes Absolute: 0.7 10*3/uL (ref 0.1–1.0)
Monocytes Relative: 7.9 % (ref 3.0–12.0)
Neutro Abs: 6.1 10*3/uL (ref 1.4–7.7)
Neutrophils Relative %: 71.9 % (ref 43.0–77.0)
Platelets: 199 10*3/uL (ref 150.0–400.0)
RBC: 3.96 Mil/uL (ref 3.87–5.11)
RDW: 13.8 % (ref 11.5–15.5)
WBC: 8.5 10*3/uL (ref 4.0–10.5)

## 2020-12-05 NOTE — Patient Instructions (Signed)
Change the dressing daily, more often if needed.  Elevate your foot.  Go to the lab on the way out.   If you have mychart we'll likely use that to update you.    Keep taking keflex.  Update me as needed.  Keep the follow up with podiatry.  Take care.  Glad to see you.

## 2020-12-05 NOTE — Progress Notes (Signed)
This visit occurred during the SARS-CoV-2 public health emergency.  Safety protocols were in place, including screening questions prior to the visit, additional usage of staff PPE, and extensive cleaning of exam room while observing appropriate contact time as indicated for disinfecting solutions.  Inpatient f/u.    =================  Recommendations for Outpatient Follow-up:  1. Follow up with PCP in 1-2 weeks 2. Please obtain BMP/CBC in one week 3. Please follow up with podiatry for outpatient wound care and postop evaluation  Home Health: Nursing Equipment/Devices: Walking boot  Discharge Condition: Stable CODE STATUS: Full Diet recommendation: Low-salt low-fat low-carb diet  Brief/Interim Summary: 65 y.o.Caucasian femalewith medical history significant forcoronary artery disease, hypertension, obstructive sleep apnea, type 2 diabetes mellitus, systolic CHF status post AICD due to episodes of V. tach in the past and previous history of osteomyelitis of the ankle and foot and depression, who presented to the emergency room with acute onset of worsening left foot swelling with erythema for the last month with left foot wound which has been followed by podiatry, on initial x-rays there was evidence of some subcutaneous air in the left foot and she was transferred from [facility] to [facility] for AICD compatible MRI.  MRI of patient's lower extremity was noted to have osteomyelitis of the left foot -status post I&D and debridement of left foot with Dr. Earleen Newport podiatry.  Patient tolerated procedure well, based on culture and sensitivities will transition to Keflex twice daily at discharge as discussed, close follow-up with podiatry next week at their office as scheduled.  Patient otherwise will bandage changes per surgery at home with home health nursing.  Patient discharged with walking boot to ensure safe ambulation, PT agreeable for discharge home given patient's marked improvement, pain  currently well controlled on tramadol, will continue the same at discharge at home.   Discharge Diagnoses:  Decubitus ulcer left foot, cellulitis, abscess ================= Inpatient follow-up.  Admitted with diabetic foot lesion/cellulitis but not osteomyelitis.  She has incision and drainage done, started on antibiotics, and improved to the point of being discharged.  Area still bandaged.  She is elevating it as much as she can.  When she is up and moving she is using a cam walker boot to protect the area.  She feels better in the meantime. No pain- she had pain prev.  No fevers.  No vomiting, no rash, no diarrhea.  rebandaged this AM, minimal drainage.  No redness locally.    A1c 8.1.  Inpatient labs discussed with patient.  Culture data d/w pt. previous MRI results discussed with patient.  Meds, vitals, and allergies reviewed.   ROS: Per HPI unless specifically indicated in ROS section   GEN: nad, alert and oriented HEENT: ncat NECK: supple w/o LA CV: rrr.  PULM: ctab, no inc wob ABD: soft, +bs EXT: no edema SKIN: Left foot has some mild redness on the dorsum but this is improved compared to previous.  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  She has plantar lesion on the left foot distally that appears to be healing.  Significant decrease in redness and tenderness locally per patient.  She has 3 stitches loosely placed there is still intact with plan for them to be removed by podiatry at follow-up.  Area redressed.

## 2020-12-06 NOTE — Assessment & Plan Note (Signed)
Discussed A1c and foot care.  In the meantime she is improved.  It makes sense to keep the follow-up with podiatry, continue antibiotics, continue offloading and elevating, and recheck her labs today.  See notes on labs.  At this point still okay for outpatient follow-up.  I think it makes sense to consider her sugars over the next few days and if she has significant sugar elevations at home then let me know.  She agrees with plan.  See after visit summary.  30 minutes were devoted to patient care in this encounter (this includes time spent reviewing the patient's file/history, interviewing and examining the patient, counseling/reviewing plan with patient).

## 2020-12-07 ENCOUNTER — Other Ambulatory Visit: Payer: Self-pay

## 2020-12-07 ENCOUNTER — Ambulatory Visit (INDEPENDENT_AMBULATORY_CARE_PROVIDER_SITE_OTHER): Payer: HMO | Admitting: Podiatry

## 2020-12-07 DIAGNOSIS — E1149 Type 2 diabetes mellitus with other diabetic neurological complication: Secondary | ICD-10-CM

## 2020-12-07 DIAGNOSIS — L97521 Non-pressure chronic ulcer of other part of left foot limited to breakdown of skin: Secondary | ICD-10-CM

## 2020-12-07 MED ORDER — CEPHALEXIN 500 MG PO CAPS: 500.0000 mg | ORAL_CAPSULE | Freq: Two times a day (BID) | ORAL | 0 refills | Status: AC

## 2020-12-12 NOTE — Progress Notes (Signed)
Subjective: [name] is a 65 y.o. is seen today in office s/p left foot I&D, wound excison preformed on 2/162022.  This is been doing without any significant discomfort.  She is wearing the cam boot.  She is on Keflex with any side effects.  She is been change the bandage at home and applying a bandage. Denies any systemic complaints such as fevers, chills, nausea, vomiting. No calf pain, chest pain, shortness of breath.   Objective: General: No acute distress, AAOx3  DP/PT pulses palpable 2/4, CRT < 3 sec to all digits.   LEFT foot: Wound on the plantar aspect submetatarsal 1 with sutures still intact.  Superficial granular wound is present without any probing to bone, undermining or tunneling.  There is faint edema erythema to the hallux but much improved compared to when she was discharged from the hospital. There is no ascending cellulitis, fluctuance, crepitus, malodor, drainage/purulence. There is minimal edema around the surgical site. There is no pain along the surgical site.  No other areas of tenderness to bilateral lower extremities.  No other open lesions or pre-ulcerative lesions.  No pain with calf compression, swelling, warmth, erythema.   Assessment and Plan:  Status post left foot I&D, doing well with no complications   -Treatment options discussed including all alternatives, risks, and complications -Plan I do not amount of Keflex.  Continue daily dressing changes.  Continue cam boot, offloading and elevation. -Monitor for any clinical signs or symptoms of infection and DVT/PE and directed to call the office immediately should any occur or go to the ER. -Follow-up as scheduled or sooner if any problems arise. In the meantime, encouraged to call the office with any questions, concerns, change in symptoms.   Celesta Gentile, DPM

## 2020-12-18 ENCOUNTER — Telehealth: Payer: Self-pay

## 2020-12-18 ENCOUNTER — Ambulatory Visit: Payer: PPO | Admitting: Podiatry

## 2020-12-18 ENCOUNTER — Other Ambulatory Visit: Payer: Self-pay

## 2020-12-18 DIAGNOSIS — I129 Hypertensive chronic kidney disease with stage 1 through stage 4 chronic kidney disease, or unspecified chronic kidney disease: Secondary | ICD-10-CM | POA: Diagnosis not present

## 2020-12-18 DIAGNOSIS — R768 Other specified abnormal immunological findings in serum: Secondary | ICD-10-CM | POA: Diagnosis not present

## 2020-12-18 DIAGNOSIS — D631 Anemia in chronic kidney disease: Secondary | ICD-10-CM | POA: Diagnosis not present

## 2020-12-18 DIAGNOSIS — E1122 Type 2 diabetes mellitus with diabetic chronic kidney disease: Secondary | ICD-10-CM | POA: Diagnosis not present

## 2020-12-18 DIAGNOSIS — L97501 Non-pressure chronic ulcer of other part of unspecified foot limited to breakdown of skin: Secondary | ICD-10-CM

## 2020-12-18 DIAGNOSIS — E1149 Type 2 diabetes mellitus with other diabetic neurological complication: Secondary | ICD-10-CM

## 2020-12-18 DIAGNOSIS — N2581 Secondary hyperparathyroidism of renal origin: Secondary | ICD-10-CM | POA: Diagnosis not present

## 2020-12-18 DIAGNOSIS — E875 Hyperkalemia: Secondary | ICD-10-CM | POA: Diagnosis not present

## 2020-12-18 DIAGNOSIS — N184 Chronic kidney disease, stage 4 (severe): Secondary | ICD-10-CM | POA: Diagnosis not present

## 2020-12-18 DIAGNOSIS — L97521 Non-pressure chronic ulcer of other part of left foot limited to breakdown of skin: Secondary | ICD-10-CM

## 2020-12-18 DIAGNOSIS — R809 Proteinuria, unspecified: Secondary | ICD-10-CM | POA: Diagnosis not present

## 2020-12-18 MED ORDER — CEPHALEXIN 500 MG PO CAPS: 500.0000 mg | ORAL_CAPSULE | Freq: Two times a day (BID) | ORAL | 0 refills | Status: DC

## 2020-12-18 NOTE — Telephone Encounter (Signed)
Orders for wound care supplies for foot ulcer size  1 cm x 0.2 cm x 1 cm faxed to [facility]. Calcium alginate dressing AG daily Conforming roll gauze daily  Sterile gauze 4 x 4 daily  Special instructions to mail supplies to : [address] [place], [place]  [address]

## 2020-12-18 NOTE — Telephone Encounter (Signed)
LVM notifying pt the Dr Damita Dunnings did not need her to come for lab appt on 12/27/20 because she had recent lab work. The lab appt is canceled

## 2020-12-19 DIAGNOSIS — E11621 Type 2 diabetes mellitus with foot ulcer: Secondary | ICD-10-CM | POA: Diagnosis not present

## 2020-12-24 NOTE — Progress Notes (Signed)
Subjective: [name] is a 65 y.o. is seen today in office s/p left foot I&D, wound excison preformed on 2/162022.  She states there still been some bloody drainage coming from the area but no pus.  The swelling the redness is much improved.  She is still on Keflex with any side effects.  Denies any fevers, chills, nausea, vomiting.  No calf pain, chest pain, shortness of breath.  She has no new concerns otherwise today.  Objective: General: No acute distress, AAOx3  DP/PT pulses palpable 2/4, CRT < 3 sec to all digits.   LEFT foot: Wound on the plantar aspect submetatarsal 1 with sutures still intact.  I remove the sutures today.  The wound today does have more depth today in the wound total measures 1 x 0.2 x 1 cm.  There is no probing to bone, undermining or tunneling.  There is no purulence identified.  There is serosanguineous drainage on the bandage.  There is no fluctuance or crepitation.  There is no malodor No other open lesions or pre-ulcerative lesions.  No pain with calf compression, swelling, warmth, erythema.   Assessment and Plan:  Status post left foot I&D, doing well with no complications   -Treatment options discussed including all alternatives, risks, and complications -Remove the sutures today.  Clean the wound.  I will order dressings today through [facility] to include calcium alginate dressing with silver.  Also order rolled gauze and 4 x 4's.  I gave them the updated address to mail to her mom's house. -We discussed possible return to surgery for debridement, washout as well as delayed primary closure of the wound if needed. -.[email or web address]  Return in about 10 days (around 12/28/2020) for wound check .  Trula Slade DPM

## 2020-12-25 ENCOUNTER — Ambulatory Visit: Payer: PPO | Admitting: Podiatry

## 2020-12-25 DIAGNOSIS — R768 Other specified abnormal immunological findings in serum: Secondary | ICD-10-CM | POA: Diagnosis not present

## 2020-12-25 DIAGNOSIS — E1122 Type 2 diabetes mellitus with diabetic chronic kidney disease: Secondary | ICD-10-CM | POA: Diagnosis not present

## 2020-12-25 DIAGNOSIS — R809 Proteinuria, unspecified: Secondary | ICD-10-CM | POA: Diagnosis not present

## 2020-12-25 DIAGNOSIS — E875 Hyperkalemia: Secondary | ICD-10-CM | POA: Diagnosis not present

## 2020-12-25 DIAGNOSIS — N2581 Secondary hyperparathyroidism of renal origin: Secondary | ICD-10-CM | POA: Diagnosis not present

## 2020-12-25 DIAGNOSIS — I129 Hypertensive chronic kidney disease with stage 1 through stage 4 chronic kidney disease, or unspecified chronic kidney disease: Secondary | ICD-10-CM | POA: Diagnosis not present

## 2020-12-25 DIAGNOSIS — N184 Chronic kidney disease, stage 4 (severe): Secondary | ICD-10-CM | POA: Diagnosis not present

## 2020-12-25 DIAGNOSIS — D631 Anemia in chronic kidney disease: Secondary | ICD-10-CM | POA: Diagnosis not present

## 2020-12-27 ENCOUNTER — Other Ambulatory Visit: Payer: PPO

## 2020-12-27 DIAGNOSIS — S31829D Unspecified open wound of left buttock, subsequent encounter: Secondary | ICD-10-CM | POA: Diagnosis not present

## 2020-12-28 ENCOUNTER — Ambulatory Visit (INDEPENDENT_AMBULATORY_CARE_PROVIDER_SITE_OTHER): Payer: HMO

## 2020-12-28 ENCOUNTER — Ambulatory Visit: Payer: HMO | Admitting: Podiatry

## 2020-12-28 ENCOUNTER — Other Ambulatory Visit: Payer: Self-pay

## 2020-12-28 DIAGNOSIS — I428 Other cardiomyopathies: Secondary | ICD-10-CM

## 2020-12-28 DIAGNOSIS — L97501 Non-pressure chronic ulcer of other part of unspecified foot limited to breakdown of skin: Secondary | ICD-10-CM

## 2020-12-28 DIAGNOSIS — E1149 Type 2 diabetes mellitus with other diabetic neurological complication: Secondary | ICD-10-CM

## 2020-12-28 LAB — CUP PACEART REMOTE DEVICE CHECK
Battery Remaining Longevity: 73 mo
Battery Voltage: 2.99 V
Brady Statistic AP VP Percent: 0.18 %
Brady Statistic AP VS Percent: 0.01 %
Brady Statistic AS VP Percent: 99.7 %
Brady Statistic AS VS Percent: 0.11 %
Brady Statistic RA Percent Paced: 0.19 %
Brady Statistic RV Percent Paced: 99.79 %
Date Time Interrogation Session: 20220324001603
HighPow Impedance: 52 Ohm
Implantable Lead Implant Date: 20200625
Implantable Lead Implant Date: 20200625
Implantable Lead Implant Date: 20200625
Implantable Lead Location: 753858
Implantable Lead Location: 753859
Implantable Lead Location: 753860
Implantable Lead Model: 4398
Implantable Lead Model: 5076
Implantable Lead Model: 6935
Implantable Pulse Generator Implant Date: 20200625
Lead Channel Impedance Value: 188.1 Ohm
Lead Channel Impedance Value: 208.174
Lead Channel Impedance Value: 208.174
Lead Channel Impedance Value: 234.08 Ohm
Lead Channel Impedance Value: 234.08 Ohm
Lead Channel Impedance Value: 342 Ohm
Lead Channel Impedance Value: 399 Ohm
Lead Channel Impedance Value: 399 Ohm
Lead Channel Impedance Value: 418 Ohm
Lead Channel Impedance Value: 456 Ohm
Lead Channel Impedance Value: 532 Ohm
Lead Channel Impedance Value: 532 Ohm
Lead Channel Impedance Value: 665 Ohm
Lead Channel Impedance Value: 722 Ohm
Lead Channel Impedance Value: 760 Ohm
Lead Channel Impedance Value: 817 Ohm
Lead Channel Impedance Value: 817 Ohm
Lead Channel Impedance Value: 836 Ohm
Lead Channel Pacing Threshold Amplitude: 0.625 V
Lead Channel Pacing Threshold Amplitude: 0.625 V
Lead Channel Pacing Threshold Amplitude: 1.875 V
Lead Channel Pacing Threshold Pulse Width: 0.4 ms
Lead Channel Pacing Threshold Pulse Width: 0.4 ms
Lead Channel Pacing Threshold Pulse Width: 0.8 ms
Lead Channel Sensing Intrinsic Amplitude: 0.75 mV
Lead Channel Sensing Intrinsic Amplitude: 0.75 mV
Lead Channel Sensing Intrinsic Amplitude: 17.625 mV
Lead Channel Sensing Intrinsic Amplitude: 17.625 mV
Lead Channel Setting Pacing Amplitude: 2 V
Lead Channel Setting Pacing Amplitude: 2 V
Lead Channel Setting Pacing Amplitude: 2.25 V
Lead Channel Setting Pacing Pulse Width: 0.4 ms
Lead Channel Setting Pacing Pulse Width: 0.8 ms
Lead Channel Setting Sensing Sensitivity: 0.3 mV

## 2020-12-31 NOTE — Progress Notes (Signed)
Subjective: [name] is a 65 y.o. is seen today in office s/p left foot I&D, wound excison preformed on 2/162022.  At last appointment order dressings for her through [facility] but she has not received them.  So there is some drainage coming from the ulcer.  Denies any increase in swelling or redness.  The swelling the redness that she is having she is in the hospital has resolved for the most part she reports.  Denies any fevers, chills, nausea, vomiting.  No calf pain, chest pain, shortness of breath.   Objective: General: No acute distress, AAOx3  DP/PT pulses palpable 2/4, CRT < 3 sec to all digits.   LEFT foot: Wound on the plantar aspect submetatarsal 1.  The wound today does have more depth today in the wound total measures 1 x 0.3 x 0.8 cm.  There is no probing to bone, undermining or tunneling.  There is no purulence identified.  There is serosanguineous drainage on the bandage.  There is no fluctuance or crepitation.  There is no malodor No other open lesions or pre-ulcerative lesions.  No pain with calf compression, swelling, warmth, erythema.   Assessment and Plan:  Status post left foot I&D  -Treatment options discussed including all alternatives, risks, and complications -The wound was cleansed today.  Loss of tissue.Marland Kitchen  The wound down to healthy, bleeding, viable tissue to promote wound healing and debride any nonviable tissue that was present within the wound.  For now we can continue with iodosorb. I would like for her to leave the dressing that water through [facility].  We will contact the company and they can reorder this. Monitor for any clinical signs or symptoms of infection and directed to call the office immediately should any occur or go to the ER.  Trula Slade DPM

## 2021-01-04 ENCOUNTER — Other Ambulatory Visit: Payer: Self-pay

## 2021-01-04 ENCOUNTER — Ambulatory Visit: Payer: HMO | Admitting: Podiatrist

## 2021-01-04 ENCOUNTER — Encounter: Payer: Self-pay | Admitting: Podiatrist

## 2021-01-04 DIAGNOSIS — L97521 Non-pressure chronic ulcer of other part of left foot limited to breakdown of skin: Secondary | ICD-10-CM

## 2021-01-04 DIAGNOSIS — E11621 Type 2 diabetes mellitus with foot ulcer: Secondary | ICD-10-CM

## 2021-01-04 NOTE — Progress Notes (Signed)
Chief Complaint  Patient presents with  . Foot Ulcer    1 week follow up left foot ulcer     HPI: Patient is 65 y.o. female who presents today for follow up of ulcer submet 1 left.  She had an I&D performed 11/22/2020.  Her dressing supplies have arrived via [facility] consisting of a calcium alginate dressing which is in place today. She presents today wearing her boot on the left foot.    No Known Allergies  Review of systems is reviewed and negative.   Physical Exam  Patient is awake, alert, and oriented x 3.  In no acute distress.    Vascular status is intact with palpable pedal pulses DP and PT bilateral and capillary refill time less than 3 seconds bilateral.  No edema or erythema noted.  Neurological exam reveals epicritic and protective sensation decrased left.   Left foot: Ulcer submet 1 leftt foot is stable.  Red, granular base with keratotic rim noted.  Ulcer measures 1cm in diameter and 0.7cm in depth post debridement. No probing to bone noted.  No redness, swelling, or drainage noted.  No malodor noted.       Assessment: Ulcer submet 1 left foot.   Status post incision and drainage.   Plan: Debridement of the ulcer was accomplished today with tissue nippers to the level of bleeding and viable tissue. A dressing of antibiotic medihoney and gauze was applied She will re dress with the calcium algnate tomororrow. Will return in 10 days for follow up .  If any redness arises she will go to the ed and call our office.

## 2021-01-08 ENCOUNTER — Other Ambulatory Visit: Payer: Self-pay | Admitting: Family Medicine

## 2021-01-09 NOTE — Progress Notes (Signed)
Remote ICD transmission.   

## 2021-01-10 ENCOUNTER — Telehealth: Payer: Self-pay

## 2021-01-10 MED ORDER — TRAMADOL HCL 50 MG PO TABS: 50.0000 mg | ORAL_TABLET | Freq: Every evening | ORAL | 1 refills | Status: DC | PRN

## 2021-01-10 NOTE — Telephone Encounter (Signed)
[facility] called and stated that they have sent in multiple refill requests for Tramadol. It looks like the patient was on this medication for a while, but it was discontinued by the [facility] Holli Humbles, MD. Patient is requesting that be re-ordered and she doesn't know why it was discontinued. Please advise.

## 2021-01-10 NOTE — Telephone Encounter (Signed)
Sent. Thanks.   

## 2021-01-10 NOTE — Addendum Note (Signed)
Addended by: [name] on: 01/10/2021 04:30 PM   Modules accepted: Orders

## 2021-01-10 NOTE — Telephone Encounter (Signed)
Patient notified rx was sent to [facility].

## 2021-01-13 ENCOUNTER — Other Ambulatory Visit: Payer: Self-pay

## 2021-01-13 ENCOUNTER — Emergency Department (HOSPITAL_COMMUNITY): Payer: HMO

## 2021-01-13 ENCOUNTER — Emergency Department (HOSPITAL_COMMUNITY)
Admission: EM | Admit: 2021-01-13 | Discharge: 2021-01-13 | Disposition: A | Discharge status: Home / Self Care | Payer: HMO | Attending: Emergency Medicine | Admitting: Emergency Medicine

## 2021-01-13 DIAGNOSIS — E1122 Type 2 diabetes mellitus with diabetic chronic kidney disease: Secondary | ICD-10-CM | POA: Diagnosis not present

## 2021-01-13 DIAGNOSIS — L97521 Non-pressure chronic ulcer of other part of left foot limited to breakdown of skin: Secondary | ICD-10-CM | POA: Diagnosis not present

## 2021-01-13 DIAGNOSIS — N189 Chronic kidney disease, unspecified: Secondary | ICD-10-CM | POA: Diagnosis not present

## 2021-01-13 DIAGNOSIS — I2510 Atherosclerotic heart disease of native coronary artery without angina pectoris: Secondary | ICD-10-CM | POA: Insufficient documentation

## 2021-01-13 DIAGNOSIS — Z79899 Other long term (current) drug therapy: Secondary | ICD-10-CM | POA: Diagnosis not present

## 2021-01-13 DIAGNOSIS — I5043 Acute on chronic combined systolic (congestive) and diastolic (congestive) heart failure: Secondary | ICD-10-CM | POA: Insufficient documentation

## 2021-01-13 DIAGNOSIS — L97529 Non-pressure chronic ulcer of other part of left foot with unspecified severity: Secondary | ICD-10-CM

## 2021-01-13 DIAGNOSIS — Z794 Long term (current) use of insulin: Secondary | ICD-10-CM | POA: Insufficient documentation

## 2021-01-13 DIAGNOSIS — L03116 Cellulitis of left lower limb: Secondary | ICD-10-CM | POA: Diagnosis not present

## 2021-01-13 DIAGNOSIS — M79672 Pain in left foot: Secondary | ICD-10-CM | POA: Diagnosis present

## 2021-01-13 DIAGNOSIS — I130 Hypertensive heart and chronic kidney disease with heart failure and stage 1 through stage 4 chronic kidney disease, or unspecified chronic kidney disease: Secondary | ICD-10-CM | POA: Insufficient documentation

## 2021-01-13 DIAGNOSIS — L97522 Non-pressure chronic ulcer of other part of left foot with fat layer exposed: Secondary | ICD-10-CM | POA: Diagnosis not present

## 2021-01-13 DIAGNOSIS — E11621 Type 2 diabetes mellitus with foot ulcer: Secondary | ICD-10-CM | POA: Diagnosis not present

## 2021-01-13 DIAGNOSIS — L03119 Cellulitis of unspecified part of limb: Secondary | ICD-10-CM

## 2021-01-13 LAB — CBC WITH DIFFERENTIAL/PLATELET
Abs Immature Granulocytes: 0.04 10*3/uL (ref 0.00–0.07)
Basophils Absolute: 0 10*3/uL (ref 0.0–0.1)
Basophils Relative: 0 %
Eosinophils Absolute: 0.1 10*3/uL (ref 0.0–0.5)
Eosinophils Relative: 1 %
HCT: 37.7 % (ref 36.0–46.0)
Hemoglobin: 12.3 g/dL (ref 12.0–15.0)
Immature Granulocytes: 0 %
Lymphocytes Relative: 14 %
Lymphs Abs: 1.6 10*3/uL (ref 0.7–4.0)
MCH: 31.8 pg (ref 26.0–34.0)
MCHC: 32.6 g/dL (ref 30.0–36.0)
MCV: 97.4 fL (ref 80.0–100.0)
Monocytes Absolute: 0.6 10*3/uL (ref 0.1–1.0)
Monocytes Relative: 6 %
Neutro Abs: 9.4 10*3/uL — ABNORMAL HIGH (ref 1.7–7.7)
Neutrophils Relative %: 79 %
Platelets: 146 10*3/uL — ABNORMAL LOW (ref 150–400)
RBC: 3.87 MIL/uL (ref 3.87–5.11)
RDW: 13.6 % (ref 11.5–15.5)
WBC: 11.7 10*3/uL — ABNORMAL HIGH (ref 4.0–10.5)
nRBC: 0 % (ref 0.0–0.2)

## 2021-01-13 LAB — COMPREHENSIVE METABOLIC PANEL
ALT: 23 U/L (ref 0–44)
AST: 22 U/L (ref 15–41)
Albumin: 3.7 g/dL (ref 3.5–5.0)
Alkaline Phosphatase: 90 U/L (ref 38–126)
Anion gap: 10 (ref 5–15)
BUN: 55 mg/dL — ABNORMAL HIGH (ref 8–23)
CO2: 30 mmol/L (ref 22–32)
Calcium: 9.1 mg/dL (ref 8.9–10.3)
Chloride: 93 mmol/L — ABNORMAL LOW (ref 98–111)
Creatinine, Ser: 2.67 mg/dL — ABNORMAL HIGH (ref 0.44–1.00)
GFR, Estimated: 19 mL/min — ABNORMAL LOW (ref >60)
Glucose, Bld: 335 mg/dL — ABNORMAL HIGH (ref 70–99)
Potassium: 3.8 mmol/L (ref 3.5–5.1)
Sodium: 133 mmol/L — ABNORMAL LOW (ref 135–145)
Total Bilirubin: 0.8 mg/dL (ref 0.3–1.2)
Total Protein: 7.6 g/dL (ref 6.5–8.1)

## 2021-01-13 LAB — LACTIC ACID, PLASMA
Lactic Acid, Venous: 2 mmol/L (ref 0.5–1.9)
Lactic Acid, Venous: 1 mmol/L (ref 0.5–1.9)

## 2021-01-13 MED ORDER — CLINDAMYCIN PHOSPHATE 600 MG/50ML IV SOLN
600.0000 mg | Freq: Once | INTRAVENOUS | Status: AC
  Administered 2021-01-13: 600 mg via INTRAVENOUS
  Filled 2021-01-13: qty 50

## 2021-01-13 MED ORDER — MORPHINE SULFATE (PF) 4 MG/ML IV SOLN
4.0000 mg | Freq: Once | INTRAVENOUS | Status: AC
  Administered 2021-01-13: 4 mg via INTRAVENOUS
  Filled 2021-01-13: qty 1

## 2021-01-13 MED ORDER — SODIUM CHLORIDE 0.9 % IV BOLUS
1000.0000 mL | Freq: Once | INTRAVENOUS | Status: AC
  Administered 2021-01-13: 1000 mL via INTRAVENOUS

## 2021-01-13 MED ORDER — CLINDAMYCIN HCL 300 MG PO CAPS: 300.0000 mg | ORAL_CAPSULE | Freq: Three times a day (TID) | ORAL | 0 refills | Status: DC

## 2021-01-13 NOTE — ED Triage Notes (Addendum)
Pt comes in POV from home with recent hx of admission for infection of left foot approx. 6 wks ago.  Has been followed by [facility] since.  Pt has redness of the skin on distal aspect of foot.  Wounds are present on ball of foot and lateral aspect with ulceration to both.  Pt has DMII which is "sort of" controlled according to the pt.  Pt takes lantus and novolog. States fasting blood glucose was 127 this morning.

## 2021-01-13 NOTE — ED Notes (Signed)
Patient verbalized understanding of discharge instructions. Opportunity for questions and answers.  

## 2021-01-13 NOTE — ED Provider Notes (Signed)
[facility] EMERGENCY DEPARTMENT Provider Note   CSN: [id number] Arrival date & time: 01/13/21  1457     History Chief Complaint  Patient presents with  . Foot Pain    [name] is a 65 y.o. female history of left foot osteomyelitis, known foot ulcer, diabetes, CAD who presented with left foot pain and redness.  Patient has a known foot ulcer.  She was admitted in the end of February and had I & D done by podiatry and finished several courses antibiotics.  Her last course of antibiotics was about a month ago.  Patient states that for the last 3 to 4 days, she noticed progressive swelling and redness of the left foot around the ulcer.  Denies any trauma or injury.  Denies any fevers or chills.   The history is provided by the patient.       Past Medical History:  Diagnosis Date  . Acute myocardial infarction, subendocardial infarction, initial episode of care ([place]) 07/21/2012  . Acute osteomyelitis involving ankle and foot ([place]) 02/06/2015  . Acute systolic heart failure ([place]) 07/21/2012   New onset 07/19/12; admission to [facility] ED. Elevated Troponins.  S/p 2D-echo with EF 20-25%.  S/p cardiac catheterization with stenting LAD.  Repeat 2D-echo 10/2011 with improved EF of 35%.   . Anemia   . Automatic implantable cardioverter-defibrillator in situ    a. MDT CRT-D 06/2014, SN: [id number]  .removed in feb 2020  . CAD (coronary artery disease)    a. cardiac cath 101/04/2012: PCI/DES to chronically occluded mLAD, consideration PCI to diag branch in 4 weeks.   . Cataract   . Chicken pox   . Chronic kidney disease   . Chronic systolic CHF (congestive heart failure) (HCC)    a. mixed ICM & NICM; b. EF 20-25% by echo 07/2012, mid-dist 2/3 of LV sev HK/AK, mild MR. echo 10/2012: EF 30-35%, sev HK ant-septal & inf walls, GR1DD, mild MR, PASP 33. c. echo 02/2013: EF 30%, GR1DD, mild MR. echo 04/2014: EF 30%, Septal-lat dyssynchrony, global HK, inf AK, GR1DD, mild MR. d.  echo 10/2014: EF50-55%, WM nl, GR1DD, septal mild paradox. e. echo 02/2015: EF 50-55%, wm   . Depression   . Heart attack ([place])   . Heel ulcer ([place]) 04/27/2015  . History of blood transfusion ~ 2011   "plasma; had neuropathy; couldn't walk"  . Hypertension   . LBBB (left bundle branch block)   . Neuromuscular disorder ([place])   . Neuropathy 2011  . Obesity, unspecified   . OSA on CPAP    Moderate with AHI 23/hr and now on CPAP at 16cm H2O.  Her DME is [facility]  . Pure hypercholesterolemia   . Sepsis ([place]) 09/2018  . Type II diabetes mellitus ([place])   . Unspecified vitamin D deficiency     Patient Active Problem List   Diagnosis Date Noted  . Cellulitis of left foot 11/25/2020  . Diabetic foot ulcer ([place]) 11/24/2020  . Advance care planning 10/04/2020  . Secondary hyperparathyroidism of renal origin ([place]) 07/20/2019  . Hyperkalemia 07/20/2019  . Benign hypertensive kidney disease with chronic kidney disease 07/20/2019  . Anemia in chronic kidney disease 07/20/2019  . Positive antinuclear antibody 07/20/2019  . Proteinuria 07/20/2019  . Acute on chronic systolic (congestive) heart failure ([place]) 03/23/2019  . UTI (urinary tract infection) 03/09/2019  . CAD (coronary artery disease) 03/09/2019  . DM type 2, uncontrolled, with renal complications ([place]) 60/63/0160  . Atrial tachycardia ([place])   .  Acute kidney injury superimposed on chronic kidney disease ([place])   . AKI (acute kidney injury) ([place])   . Weakness generalized 12/22/2018  . Shortness of breath 12/22/2018  . Palliative care encounter 12/22/2018  . Endocarditis 12/14/2018  . NSTEMI (non-ST elevated myocardial infarction) ([place]) 11/22/2018  . Acute on chronic combined systolic and diastolic CHF (congestive heart failure) ([place]) 10/28/2018  . Lymphedema 10/28/2018  . Severe sepsis ([place]) 09/28/2018  . History of amputation of lesser toe of right foot ([place]) 04/19/2018  . Presence of cardiac resynchronization therapy defibrillator  (CRT-D) 04/19/2018  . Paraparesis of both lower limbs ([place]) 03/29/2018  . 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 Paroxysmal atrial fibrillation ([place]) 03/29/2018  . Class 3 obesity due to excess calories with serious comorbidity and body mass index (BMI) of 40.0 to 44.9 in adult 09/02/2016  . History of CVA (cerebrovascular accident) 11/11/2015  . Chronic renal insufficiency 08/04/2015  . OSA (obstructive sleep apnea) 08/15/2014  . Morbid obesity ([place]) 10/26/2013  . Essential hypertension, benign 04/29/2013  . Pure hypercholesterolemia 12/07/2012  . Iron deficiency anemia 12/07/2012  . Diabetic peripheral neuropathy associated with type 2 diabetes mellitus ([place]) 12/07/2012  . Chronic systolic congestive heart failure ([place]) 07/29/2012  . Left bundle-branch block 07/25/2012  . Coronary artery disease involving native coronary artery of native heart without angina pectoris 07/25/2012    Past Surgical History:  Procedure Laterality Date  . AMPUTATION TOE Right 06/18/2015   Procedure: AMPUTATION TOE;  Surgeon: Samara Deist, DPM;  Location: [facility] ORS;  Service: Podiatry;  Laterality: Right;  . AMPUTATION TOE Left 10/08/2018   Procedure: AMPUTATION TOE LEFT 2ND;  Surgeon: Albertine Patricia, DPM;  Location: [facility] ORS;  Service: Podiatry;  Laterality: Left;  . BACK SURGERY    . BI-VENTRICULAR IMPLANTABLE CARDIOVERTER DEFIBRILLATOR N/A 07/06/2014   Procedure: BI-VENTRICULAR IMPLANTABLE CARDIOVERTER DEFIBRILLATOR  (CRT-D);  Surgeon: Deboraha Sprang, MD;  Location: [facility] CATH LAB;  Service: Cardiovascular;  Laterality: N/A;  . BI-VENTRICULAR IMPLANTABLE CARDIOVERTER DEFIBRILLATOR  (CRT-D)  07/06/2014  . BILATERAL OOPHORECTOMY  01/2011   ovarian cyst benign  . BIV ICD INSERTION CRT-D N/A 03/31/2019   Procedure: BIV ICD INSERTION CRT-D;  Surgeon: Constance Haw, MD;  Location: [facility] CV LAB;  Service: Cardiovascular;  Laterality: N/A;  . COLONOSCOPY WITH PROPOFOL Left 02/22/2015   Procedure:  COLONOSCOPY WITH PROPOFOL;  Surgeon: Hulen Luster, MD;  Location[facility] ENDOSCOPY;  Service: Endoscopy;  Laterality: Left;  . CORONARY ANGIOPLASTY WITH STENT PLACEMENT Left 07/2012   new onset systolic CHF; elevated troponins.  Cardiac catheterization with stenting to LAD; EF 15%.  2D-echo: EF 20-25%.  . ESOPHAGOGASTRODUODENOSCOPY N/A 02/22/2015   Procedure: ESOPHAGOGASTRODUODENOSCOPY (EGD);  Surgeon: Hulen Luster, MD;  Location: [facility] ENDOSCOPY;  Service: Endoscopy;  Laterality: N/A;  . I & D EXTREMITY Left 11/28/2020   Procedure: IRRIGATION AND DEBRIDEMENT OF FOOT;  Surgeon: Trula Slade, DPM;  Location: [facility];  Service: Podiatry;  Laterality: Left;  . INCISION AND DRAINAGE ABSCESS Right 2007   groin; with ICU stay due to sepsis.  Marland Kitchen LAPAROSCOPIC CHOLECYSTECTOMY  2011  . LEFT HEART CATH AND CORONARY ANGIOGRAPHY N/A 10/05/2018   Procedure: LEFT HEART CATH AND CORONARY ANGIOGRAPHY;  Surgeon: Minna Merritts, MD;  Location: [facility] CV LAB;  Service: Cardiovascular;  Laterality: N/A;  . LEFT HEART CATHETERIZATION WITH CORONARY ANGIOGRAM N/A 07/21/2012   Procedure: LEFT HEART CATHETERIZATION WITH CORONARY ANGIOGRAM;  Surgeon: Jolaine Artist, MD;  Location: [facility] CATH LAB;  Service: Cardiovascular;  Laterality: N/A;  . [place]   L4-5  . PACEMAKER REMOVAL  11/2018   due to infection around pacemaker  . PERCUTANEOUS CORONARY STENT INTERVENTION (PCI-S) N/A 07/23/2012   Procedure: PERCUTANEOUS CORONARY STENT INTERVENTION (PCI-S);  Surgeon: Sherren Mocha, MD;  Location: [facility] CATH LAB;  Service: Cardiovascular;  Laterality: N/A;  . PERIPHERAL VASCULAR BALLOON ANGIOPLASTY Left 10/06/2018   Procedure: PERIPHERAL VASCULAR BALLOON ANGIOPLASTY;  Surgeon: Katha Cabal, MD;  Location: [facility] CV LAB;  Service: Cardiovascular;  Laterality: Left;  . PERIPHERAL VASCULAR CATHETERIZATION N/A 02/10/2015   Procedure: Picc Line Insertion;  Surgeon: Katha Cabal, MD;  Location: [facility] CV LAB;  Service: Cardiovascular;  Laterality: N/A;  . RIGHT HEART CATH N/A 03/29/2019   Procedure: RIGHT HEART CATH;  Surgeon: Jolaine Artist, MD;  Location: [facility] CV LAB;  Service: Cardiovascular;  Laterality: N/A;  . TEE WITHOUT CARDIOVERSION N/A 10/02/2018   Procedure: TRANSESOPHAGEAL ECHOCARDIOGRAM (TEE);  Surgeon: Wellington Hampshire, MD;  Location: [facility] ORS;  Service: Cardiovascular;  Laterality: N/A;  . [place]  . VAGINAL HYSTERECTOMY  01/2011   Fibroids/DUB.  Ovaries removed. [place].     OB History   No obstetric history on file.     Family History  Problem Relation Age of Onset  . Diabetes Mother   . Hypertension Mother   . Arthritis Mother        knees, lumbar DDD, cervical DDD  . Cancer Father        prostate,skin,lymphoma.  . Cancer Brother 81       bladder cancer; non-smoker  . Diabetes Maternal Grandmother   . Heart disease Maternal Grandmother   . COPD Maternal Grandmother   . Diabetes Paternal Grandmother   . Obesity Brother   . Diabetes Son   . Hypertension Son   . Cancer Maternal Grandfather   . Diabetes Paternal Grandfather     Social History   Tobacco Use  . Smoking status: Never Smoker  . Smokeless tobacco: Never Used  Vaping Use  . Vaping Use: Never used  Substance Use Topics  . Alcohol use: No    Alcohol/week: 0.0 standard drinks  . Drug use: No    Home Medications Prior to Admission medications   Medication Sig Start Date End Date Taking? Authorizing Provider  amiodarone (PACERONE) 200 MG tablet Take 1 tablet (200 mg total) by mouth daily. 10/30/20   Bensimhon, Shaune Pascal, MD  calcitRIOL (ROCALTROL) 0.25 MCG capsule Take 0.25 mcg by mouth daily. 11/14/20   [provider]  calcitRIOL (ROCALTROL) 0.5 MCG capsule Take 0.5 mcg by mouth daily. 12/25/20   [provider]  Calcium Carb-Cholecalciferol (CALCIUM CARBONATE-VITAMIN D3) 600-400 MG-UNIT TABS Take 1 tablet by mouth 2 (two) times a day.     [provider]  carvedilol (COREG) 3.125 MG tablet Take 3.125 mg by mouth daily.    [provider]  cephALEXin (KEFLEX) 500 MG capsule Take 1 capsule (500 mg total) by mouth 2 (two) times daily. 12/18/20   Trula Slade, DPM  clopidogrel (PLAVIX) 75 MG tablet TAKE 1 TABLET BY MOUTH ONCE DAILY 11/10/20   Minna Merritts, MD  DULoxetine (CYMBALTA) 30 MG capsule Take 1 capsule (30 mg total) by mouth at bedtime. 09/15/20   Narda Amber K, DO  empagliflozin (JARDIANCE) 10 MG TABS tablet Take 1 tablet (10 mg total) by mouth daily before breakfast. 10/16/20   Bensimhon, Shaune Pascal, MD  ferrous sulfate  325 (65 FE) MG tablet Take 325 mg by mouth 2 (two) times daily with a meal.    [provider]  gabapentin (NEURONTIN) 600 MG tablet TAKE 1 TABLET BY IN THE MORNING AND 2 ATBEDTIME 10/02/20   [name], MD  insulin aspart (NOVOLOG FLEXPEN) 100 UNIT/ML FlexPen Inject 5 Units into the skin 2 (two) times daily with a meal. 09/28/20   [name], MD  Insulin Pen Needle (PEN NEEDLES) 32G X 6 MM MISC 1 each by Does not apply route 4 (four) times daily. 09/06/19   Daleen Squibb, MD  isosorbide mononitrate (IMDUR) 30 MG 24 hr tablet Take 1 tablet (30 mg total) by mouth daily. Needs appt 05/16/20   Bensimhon, Shaune Pascal, MD  LANTUS SOLOSTAR 100 UNIT/ML Solostar Pen INJECT 22 UNITS UNDER THE SKIN DAILY. 10/02/20   [name], MD  levothyroxine (SYNTHROID) 50 MCG tablet TAKE 1 TABLET BY MOUTH ONCE DAILY BEFOREBREAKFAST 10/30/20   [name], MD  Multiple Vitamin (MULTIVITAMIN) tablet Take 1 tablet by mouth daily.    [provider]  nitroGLYCERIN (NITROSTAT) 0.4 MG SL tablet Place 1 tablet (0.4 mg total) under the tongue every 5 (five) minutes as needed for chest pain. 03/28/20 06/26/20  Deboraha Sprang, MD  Omega-3 Fatty Acids (FISH OIL) 1000 MG CAPS Take 1 capsule by mouth 2 (two) times a day.     [provider]  ONETOUCH ULTRA test strip UUD  TO CHECK BLOOD SUGAR 3 TIMES DAILY 03/02/20   Jacelyn Pi, Lilia Argue, MD  rosuvastatin (CRESTOR) 10 MG tablet TAKE 1 TABLET BY MOUTH ONCE DAILY 11/15/20   [name], MD  torsemide (DEMADEX) 20 MG tablet Take 1 tablet (20 mg total) by mouth 2 (two) times daily. 11/30/20   Little Ishikawa, MD  traMADol (ULTRAM) 50 MG tablet Take 1-2 tablets (50-100 mg total) by mouth at bedtime as needed. 01/10/21   [name], MD    Allergies    Patient has no known allergies.  Review of Systems   Review of Systems  Skin:       L foot pain   All other systems reviewed and are negative.   Physical Exam Updated Vital Signs BP (!) 155/71 (BP Location: Right Arm)   Pulse 64   Temp 97.9 F (36.6 C) (Oral)   Resp 16   Ht 5\' 8"  (1.727 m)   Wt 119.3 kg   BMI 39.99 kg/m   Physical Exam Vitals and nursing note reviewed.  Constitutional:      Appearance: Normal appearance.  HENT:     Head: Normocephalic.     Nose: Nose normal.     Mouth/Throat:     Mouth: Mucous membranes are moist.  Eyes:     Extraocular Movements: Extraocular movements intact.     Pupils: Pupils are equal, round, and reactive to light.  Cardiovascular:     Rate and Rhythm: Normal rate and regular rhythm.     Pulses: Normal pulses.     Heart sounds: Normal heart sounds.  Pulmonary:     Effort: Pulmonary effort is normal.     Breath sounds: Normal breath sounds.  Abdominal:     Palpations: Abdomen is soft.  Musculoskeletal:     Cervical back: Normal range of motion.     Comments: l foot ulcer 1st MTP joint with surrounding redness. There is small area of redness along the L 5th toe.  She has  good pulses.  There is no fluctuance  Skin:    General: Skin is warm.     Capillary Refill: Capillary refill takes less than 2 seconds.  Neurological:     General: No focal deficit present.     Mental Status: She is alert and oriented to person, place, and time.  Psychiatric:        Mood and Affect: Mood normal.         Behavior: Behavior normal.       ED Results / Procedures / Treatments   Labs (all labs ordered are listed, but only abnormal results are displayed) Labs Reviewed  CBC WITH DIFFERENTIAL/PLATELET - Abnormal; Notable for the following components:      Result Value   WBC 11.7 (*)    Platelets 146 (*)    Neutro Abs 9.4 (*)    All other components within normal limits  COMPREHENSIVE METABOLIC PANEL - Abnormal; Notable for the following components:   Sodium 133 (*)    Chloride 93 (*)    Glucose, Bld 335 (*)    BUN 55 (*)    Creatinine, Ser 2.67 (*)    GFR, Estimated 19 (*)    All other components within normal limits  LACTIC ACID, PLASMA - Abnormal; Notable for the following components:   Lactic Acid, Venous 2.0 (*)    All other components within normal limits  CULTURE, BLOOD (ROUTINE X 2)  CULTURE, BLOOD (ROUTINE X 2)  LACTIC ACID, PLASMA    EKG None  Radiology DG Foot Complete Left  Result Date: 01/13/2021 CLINICAL DATA:  A foot ulcer. EXAM: LEFT FOOT - COMPLETE 3+ VIEW COMPARISON:  September 28, 2018 FINDINGS: Interval amputation of the second digit at the level of the metatarsophalangeal joint. Soft tissue swelling and soft tissue emphysema versus open wound is seen between the bases of the first and second digit. Second area of soft tissue swelling and probable wound is seen adjacent to the fifth metatarsophalangeal joint. No cortical osseous destruction to suggest osteomyelitis radiographically. IMPRESSION: 1. Soft tissue swelling and soft tissue emphysema versus open wound between the bases of the first and second digit. 2. Second area of soft tissue swelling and probable wound adjacent to the fifth metatarsophalangeal joint. 3. No cortical osseous destruction to suggest osteomyelitis radiographically. Electronically Signed   By: Fidela Salisbury M.D.   On: 01/13/2021 16:00    Procedures Procedures   Medications Ordered in ED Medications  clindamycin (CLEOCIN) IVPB 600  mg (600 mg Intravenous New Bag/Given 01/13/21 1758)  morphine 4 MG/ML injection 4 mg (4 mg Intravenous Given 01/13/21 1752)  sodium chloride 0.9 % bolus 1,000 mL (1,000 mLs Intravenous New Bag/Given 01/13/21 1757)    ED Course  I have reviewed the triage vital signs and the nursing notes.  Pertinent labs & imaging results that were available during my care of the patient were reviewed by me and considered in my medical decision making (see chart for details).    MDM Rules/Calculators/A&P                         KETRA DUCHESNE is a 65 y.o. female who presented with left big toe ulcer with some surrounding cellulitis.  Patient is not febrile.  Her white blood count is minimally elevated at 11.  Her lactate is 2.  She has mild AKI with creatinine of 2.6 and baseline somewhere around 2.  Her x-ray did not show any osteomyelitis.  I think she has mild cellulitis from the toe ulcer.  At this point she was given clindamycin and will discharge her home with clindamycin. She has podiatry follow up this week. Told her that she will need to have her kidney function rechecked this week as well    Final Clinical Impression(s) / ED Diagnoses Final diagnoses:  None    Rx / DC Orders ED Discharge Orders    None       Drenda Freeze, MD 01/13/21 [phone]

## 2021-01-13 NOTE — Discharge Instructions (Addendum)
Take clindamycin 3 times daily for a week  You need to see your foot doctor this week  Take Tylenol or Motrin for pain  Return to ER if you have worse foot pain or swelling, fever, trouble walking.

## 2021-01-14 ENCOUNTER — Telehealth: Payer: Self-pay | Admitting: Family Medicine

## 2021-01-14 DIAGNOSIS — N189 Chronic kidney disease, unspecified: Secondary | ICD-10-CM

## 2021-01-14 DIAGNOSIS — N179 Acute kidney failure, unspecified: Secondary | ICD-10-CM

## 2021-01-14 NOTE — Telephone Encounter (Signed)
Please call pt.  Needs repeat BMET this week.  Ordered.  Thanks.

## 2021-01-15 ENCOUNTER — Other Ambulatory Visit: Payer: Self-pay | Admitting: Podiatry

## 2021-01-15 ENCOUNTER — Ambulatory Visit: Payer: HMO | Admitting: Podiatry

## 2021-01-15 ENCOUNTER — Other Ambulatory Visit: Payer: Self-pay

## 2021-01-15 DIAGNOSIS — L97522 Non-pressure chronic ulcer of other part of left foot with fat layer exposed: Secondary | ICD-10-CM | POA: Diagnosis not present

## 2021-01-15 DIAGNOSIS — E0843 Diabetes mellitus due to underlying condition with diabetic autonomic (poly)neuropathy: Secondary | ICD-10-CM | POA: Diagnosis not present

## 2021-01-15 DIAGNOSIS — M8600 Acute hematogenous osteomyelitis, unspecified site: Secondary | ICD-10-CM

## 2021-01-15 NOTE — Progress Notes (Signed)
Subjective:  65 y.o. female with PMHx of diabetes mellitus presenting today for follow-up evaluation of an ulcer to the plantar aspect of the first MTPJ left foot as well as a new blister/ulcer that developed to the lateral fifth MTPJ.  Patient went to the emergency department on 01/13/2021 for increasing erythema around the foot.  She was sent home with oral clindamycin.  She presents here in the office for follow-up treatment evaluation  Past Medical History:  Diagnosis Date  . Acute myocardial infarction, subendocardial infarction, initial episode of care ([place]) 07/21/2012  . Acute osteomyelitis involving ankle and foot ([place]) 02/06/2015  . Acute systolic heart failure ([place]) 07/21/2012   New onset 07/19/12; admission to [facility] ED. Elevated Troponins.  S/p 2D-echo with EF 20-25%.  S/p cardiac catheterization with stenting LAD.  Repeat 2D-echo 10/2011 with improved EF of 35%.   . Anemia   . Automatic implantable cardioverter-defibrillator in situ    a. MDT CRT-D 06/2014, SN: [id number]  .removed in feb 2020  . CAD (coronary artery disease)    a. cardiac cath 101/04/2012: PCI/DES to chronically occluded mLAD, consideration PCI to diag branch in 4 weeks.   . Cataract   . Chicken pox   . Chronic kidney disease   . Chronic systolic CHF (congestive heart failure) (HCC)    a. mixed ICM & NICM; b. EF 20-25% by echo 07/2012, mid-dist 2/3 of LV sev HK/AK, mild MR. echo 10/2012: EF 30-35%, sev HK ant-septal & inf walls, GR1DD, mild MR, PASP 33. c. echo 02/2013: EF 30%, GR1DD, mild MR. echo 04/2014: EF 30%, Septal-lat dyssynchrony, global HK, inf AK, GR1DD, mild MR. d. echo 10/2014: EF50-55%, WM nl, GR1DD, septal mild paradox. e. echo 02/2015: EF 50-55%, wm   . Depression   . Heart attack ([place])   . Heel ulcer ([place]) 04/27/2015  . History of blood transfusion ~ 2011   "plasma; had neuropathy; couldn't walk"  . Hypertension   . LBBB (left bundle branch block)   . Neuromuscular disorder ([place])   . Neuropathy  2011  . Obesity, unspecified   . OSA on CPAP    Moderate with AHI 23/hr and now on CPAP at 16cm H2O.  Her DME is [facility]  . Pure hypercholesterolemia   . Sepsis ([place]) 09/2018  . Type II diabetes mellitus ([place])   . Unspecified vitamin D deficiency           Objective/Physical Exam General: The patient is alert and oriented x3 in no acute distress.  Dermatology:  Wound #1 noted to the subfirst MTPJ measuring 1.3 x 1.3 x 3.0 cm (LxWxD).  The ulcer does probe to the bone to the plantar aspect of the first MTPJ likely the sesamoids.  Heavy amount of serosanguineous drainage noted.  No significant malodor noted.  Erythema noted to the dorsum of the foot that stays localized to the forefoot.  Wound #2 noted to the lateral aspect of the fifth MTPJ measuring approximately 3.0 x 1.5 x 0.2 cm.  To the noted ulceration there is a moderate amount of slough fibrin and necrotic tissue noted.  There is no exposed bone muscle tendon ligament or joint.  Moderate erythema around the wound.  Vascular: Palpable pedal pulses bilaterally. No edema or erythema noted. Capillary refill within normal limits.  Neurological: Epicritic and protective threshold diminished bilaterally.   Musculoskeletal Exam: History of left second toe amputation Assessment: 1.  Ulcers left foot secondary to diabetes mellitus 2. diabetes mellitus w/ peripheral neuropathy 3.  H/0 left second toe amputation   Plan of Care:  1. Patient was evaluated. 2. medically necessary excisional debridement including subcutaneous tissue was performed using a tissue nipper and a chisel blade. Excisional debridement of all the necrotic nonviable tissue down to healthy bleeding viable tissue was performed with post-debridement measurements same as pre-. 3. the wound was cleansed and dry sterile dressing applied. 4.  Continue oral clindamycin 300 mg 3 times daily  5.  Culture taken and sent to pathology for culture and sensitivities  6.   Postsurgical shoe dispensed.  Patient may discontinue the cam boot.  She believes the blister may have been caused by the cam boot  7.  MRI ordered left foot  8.  Patient is to return to clinic 1 week.  If the cellulitis or redness around the foot increases recommend returning back to the emergency department  Edrick Kins, DPM [facility]  Dr. Edrick Kins, DPM    [address], [place] [address]                Office [phone]  Fax [phone]

## 2021-01-15 NOTE — Addendum Note (Signed)
Addended by: Judy Pimple D on: 01/15/2021 03:10 PM   Modules accepted: Orders

## 2021-01-17 ENCOUNTER — Other Ambulatory Visit: Payer: Self-pay | Admitting: *Deleted

## 2021-01-17 ENCOUNTER — Telehealth: Payer: Self-pay | Admitting: *Deleted

## 2021-01-17 NOTE — Telephone Encounter (Signed)
Lmtcb to schedule lab appt ?

## 2021-01-17 NOTE — Telephone Encounter (Signed)
Patient calling for the status of an MRI order that was supposed to be placed at her last office visit.  Called and explained to patient that the order had been placed and that it has to be pre authorized and then someone from imaging will contact her for scheduling. She verbalized understanding.

## 2021-01-17 NOTE — Telephone Encounter (Signed)
Patient called back and scheduled lab appt for 01/24/21

## 2021-01-18 ENCOUNTER — Other Ambulatory Visit: Payer: Self-pay | Admitting: Neurology

## 2021-01-18 ENCOUNTER — Ambulatory Visit: Payer: HMO | Admitting: Podiatry

## 2021-01-18 LAB — WOUND CULTURE
MICRO NUMBER:: 11754762
SPECIMEN QUALITY:: ADEQUATE

## 2021-01-18 LAB — CULTURE, BLOOD (ROUTINE X 2)
Culture: NO GROWTH
Culture: NO GROWTH
Special Requests: ADEQUATE
Special Requests: ADEQUATE

## 2021-01-18 LAB — HOUSE ACCOUNT TRACKING

## 2021-01-22 ENCOUNTER — Ambulatory Visit: Payer: HMO | Admitting: Podiatry

## 2021-01-22 ENCOUNTER — Other Ambulatory Visit: Payer: Self-pay

## 2021-01-22 DIAGNOSIS — M8600 Acute hematogenous osteomyelitis, unspecified site: Secondary | ICD-10-CM | POA: Diagnosis not present

## 2021-01-22 DIAGNOSIS — E0843 Diabetes mellitus due to underlying condition with diabetic autonomic (poly)neuropathy: Secondary | ICD-10-CM | POA: Diagnosis not present

## 2021-01-22 DIAGNOSIS — L97522 Non-pressure chronic ulcer of other part of left foot with fat layer exposed: Secondary | ICD-10-CM | POA: Diagnosis not present

## 2021-01-22 MED ORDER — GENTAMICIN SULFATE 0.1 % EX CREA: 1.0000 "application " | TOPICAL_CREAM | Freq: Two times a day (BID) | CUTANEOUS | 1 refills | Status: DC

## 2021-01-22 MED ORDER — CLINDAMYCIN HCL 300 MG PO CAPS: 300.0000 mg | ORAL_CAPSULE | Freq: Three times a day (TID) | ORAL | 0 refills | Status: DC

## 2021-01-22 NOTE — Progress Notes (Signed)
Subjective:  65 y.o. female with PMHx of diabetes mellitus presenting today for follow-up evaluation of an ulcer to the plantar aspect of the first MTPJ left foot as well as a new blister/ulcer that developed to the lateral fifth MTPJ.  She states that there has been some improvement over the past few weeks. She presents for further treatment and evaluation  Past Medical History:  Diagnosis Date  . Acute myocardial infarction, subendocardial infarction, initial episode of care ([place]) 07/21/2012  . Acute osteomyelitis involving ankle and foot ([place]) 02/06/2015  . Acute systolic heart failure ([place]) 07/21/2012   New onset 07/19/12; admission to [facility] ED. Elevated Troponins.  S/p 2D-echo with EF 20-25%.  S/p cardiac catheterization with stenting LAD.  Repeat 2D-echo 10/2011 with improved EF of 35%.   . Anemia   . Automatic implantable cardioverter-defibrillator in situ    a. MDT CRT-D 06/2014, SN: [id number]  .removed in feb 2020  . CAD (coronary artery disease)    a. cardiac cath 101/04/2012: PCI/DES to chronically occluded mLAD, consideration PCI to diag branch in 4 weeks.   . Cataract   . Chicken pox   . Chronic kidney disease   . Chronic systolic CHF (congestive heart failure) (HCC)    a. mixed ICM & NICM; b. EF 20-25% by echo 07/2012, mid-dist 2/3 of LV sev HK/AK, mild MR. echo 10/2012: EF 30-35%, sev HK ant-septal & inf walls, GR1DD, mild MR, PASP 33. c. echo 02/2013: EF 30%, GR1DD, mild MR. echo 04/2014: EF 30%, Septal-lat dyssynchrony, global HK, inf AK, GR1DD, mild MR. d. echo 10/2014: EF50-55%, WM nl, GR1DD, septal mild paradox. e. echo 02/2015: EF 50-55%, wm   . Depression   . Heart attack ([place])   . Heel ulcer ([place]) 04/27/2015  . History of blood transfusion ~ 2011   "plasma; had neuropathy; couldn't walk"  . Hypertension   . LBBB (left bundle branch block)   . Neuromuscular disorder ([place])   . Neuropathy 2011  . Obesity, unspecified   . OSA on CPAP    Moderate with AHI 23/hr and now  on CPAP at 16cm H2O.  Her DME is [facility]  . Pure hypercholesterolemia   . Sepsis ([place]) 09/2018  . Type II diabetes mellitus ([place])   . Unspecified vitamin D deficiency      Objective/Physical Exam General: The patient is alert and oriented x3 in no acute distress.  Dermatology:  Wound #1 noted to the subfirst MTPJ measuring 1.3 x 1.3 x 3.0 cm (LxWxD).  The ulcer does probe to the bone to the plantar aspect of the first MTPJ likely the sesamoids.  Heavy amount of serosanguineous drainage noted.  No significant malodor noted.  Erythema noted to the dorsum of the foot that stays localized to the forefoot.  Wound #2 noted to the lateral aspect of the fifth MTPJ measuring approximately 3.0 x 1.5 x 0.2 cm.  To the noted ulceration there is a moderate amount of slough fibrin and necrotic tissue noted.  There is no exposed bone muscle tendon ligament or joint.  Moderate erythema around the wound.  Vascular: Palpable pedal pulses bilaterally. No edema or erythema noted. Capillary refill within normal limits.  Neurological: Epicritic and protective threshold diminished bilaterally.   Musculoskeletal Exam: History of left second toe amputation  Assessment: 1.  Ulcers left foot secondary to diabetes mellitus 2. diabetes mellitus w/ peripheral neuropathy 3.  H/0 left second toe amputation 4. 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 Patient was evaluated.  Wound cultures reviewed today 2. medically necessary excisional debridement including subcutaneous tissue was performed using a tissue nipper and a chisel blade. Excisional debridement of all the necrotic nonviable tissue down to healthy bleeding viable tissue was performed with post-debridement measurements same as pre-. 3. the wound was cleansed and dry sterile dressing applied. 4.  Continue oral clindamycin 300 mg 3 times daily.  Refill provided 5. Continue postsurgical shoe 6. Prescription for gentamicin cream. Apply daily.  7. MRI  modified to be performed at [facility] since patient has a pacemaker implant.  8. RTC 2 weeks to review MRI  Edrick Kins, DPM [facility]  Dr. Edrick Kins, DPM    [address], [place] [address]                Office [phone]  Fax [phone]

## 2021-01-23 ENCOUNTER — Other Ambulatory Visit: Payer: Self-pay | Admitting: Podiatry

## 2021-01-23 ENCOUNTER — Telehealth: Payer: Self-pay | Admitting: Podiatry

## 2021-01-23 DIAGNOSIS — G4733 Obstructive sleep apnea (adult) (pediatric): Secondary | ICD-10-CM | POA: Diagnosis not present

## 2021-01-23 MED ORDER — CLINDAMYCIN HCL 300 MG PO CAPS: 300.0000 mg | ORAL_CAPSULE | Freq: Three times a day (TID) | ORAL | 0 refills | Status: DC

## 2021-01-23 NOTE — Addendum Note (Signed)
Addended by: Edrick Kins on: 01/23/2021 01:41 PM   Modules accepted: Orders

## 2021-01-23 NOTE — Telephone Encounter (Signed)
Sorry Rx sent. - Dr.Alonzo Owczarzak

## 2021-01-23 NOTE — Telephone Encounter (Signed)
Patient called and stated that Dr. Amalia Hailey was suppose to send in an antibiotic for her left foot. She wanted to make sure it was sent in before her foot gets worse. Please send to [place]

## 2021-01-24 ENCOUNTER — Other Ambulatory Visit: Payer: Self-pay | Admitting: Neurology

## 2021-01-24 ENCOUNTER — Other Ambulatory Visit (INDEPENDENT_AMBULATORY_CARE_PROVIDER_SITE_OTHER): Payer: HMO

## 2021-01-24 ENCOUNTER — Other Ambulatory Visit: Payer: Self-pay

## 2021-01-24 DIAGNOSIS — N189 Chronic kidney disease, unspecified: Secondary | ICD-10-CM

## 2021-01-24 DIAGNOSIS — N179 Acute kidney failure, unspecified: Secondary | ICD-10-CM

## 2021-01-24 LAB — BASIC METABOLIC PANEL
BUN: 48 mg/dL — ABNORMAL HIGH (ref 6–23)
CO2: 35 mEq/L — ABNORMAL HIGH (ref 19–32)
Calcium: 9.7 mg/dL (ref 8.4–10.5)
Chloride: 96 mEq/L (ref 96–112)
Creatinine, Ser: 3.02 mg/dL — ABNORMAL HIGH (ref 0.40–1.20)
GFR: 15.54 mL/min — ABNORMAL LOW (ref >60.00)
Glucose, Bld: 104 mg/dL — ABNORMAL HIGH (ref 70–99)
Potassium: 4.6 mEq/L (ref 3.5–5.1)
Sodium: 142 mEq/L (ref 135–145)

## 2021-01-26 ENCOUNTER — Other Ambulatory Visit: Payer: Self-pay

## 2021-01-26 ENCOUNTER — Ambulatory Visit: Payer: HMO | Admitting: Neurology

## 2021-01-26 VITALS — BP 143/73 | HR 72 | Ht 68.0 in | Wt 270.6 lb

## 2021-01-26 DIAGNOSIS — E1142 Type 2 diabetes mellitus with diabetic polyneuropathy: Secondary | ICD-10-CM | POA: Diagnosis not present

## 2021-01-26 MED ORDER — DULOXETINE HCL 30 MG PO CPEP: 30.0000 mg | ORAL_CAPSULE | Freq: Every day | ORAL | 3 refills | Status: DC

## 2021-01-26 NOTE — Patient Instructions (Addendum)
Reduce gabapentin to 300mg  three times daily - take half 600mg  tablet twice daily  Continue Cymbalta 30mg  daily  Return to clinic in 6 months

## 2021-01-26 NOTE — Progress Notes (Signed)
Follow-up Visit   Date: 01/26/21   [name] MRN: [id number] DOB: 1956/06/15   Interim History: [name] is a 65 y.o. right-handed Caucasian female returning to the clinic for follow-up of diabetic polyradiculoneuropathy.  The patient was accompanied to the clinic by self.   At her last visit, Cymbalta 30mg  was added because of worsening pain.  She also takes gabapentin 600mg  in the morning and 1200mg  at bedtime. Cymbalta has significantly helped her pain.  Diabetes remains poorly-controlled. She has a left foot ulcer which is being followed by podiatry and has close follow-up and is wearing a CAM boot.  She also tells me that her creatinine is rising.     Medications:  Current Outpatient Medications on File Prior to Visit  Medication Sig Dispense Refill  . amiodarone (PACERONE) 200 MG tablet Take 1 tablet (200 mg total) by mouth daily. 90 tablet 1  . calcitRIOL (ROCALTROL) 0.5 MCG capsule Take 0.5 mcg by mouth daily.    . Calcium Carb-Cholecalciferol (CALCIUM CARBONATE-VITAMIN D3) 600-400 MG-UNIT TABS Take 1 tablet by mouth 2 (two) times a day.    . carvedilol (COREG) 3.125 MG tablet Take 3.125 mg by mouth daily.    . clopidogrel (PLAVIX) 75 MG tablet TAKE 1 TABLET BY MOUTH ONCE DAILY 90 tablet 1  . DULoxetine (CYMBALTA) 30 MG capsule Take 1 capsule (30 mg total) by mouth at bedtime. 30 capsule 3  . empagliflozin (JARDIANCE) 10 MG TABS tablet Take 1 tablet (10 mg total) by mouth daily before breakfast. 30 tablet 6  . ferrous sulfate 325 (65 FE) MG tablet Take 325 mg by mouth 2 (two) times daily with a meal.    . gabapentin (NEURONTIN) 600 MG tablet TAKE 1 TABLET BY IN THE MORNING AND 2 ATBEDTIME 270 tablet 1  . gentamicin cream (GARAMYCIN) 0.1 % Apply 1 application topically 2 (two) times daily. 30 g 1  . insulin aspart (NOVOLOG FLEXPEN) 100 UNIT/ML FlexPen Inject 5 Units into the skin 2 (two) times daily with a meal.    . Insulin Pen Needle (PEN NEEDLES) 32G X 6  MM MISC 1 each by Does not apply route 4 (four) times daily. 150 each 11  . isosorbide mononitrate (IMDUR) 30 MG 24 hr tablet Take 1 tablet (30 mg total) by mouth daily. Needs appt 90 tablet 3  . LANTUS SOLOSTAR 100 UNIT/ML Solostar Pen INJECT 22 UNITS UNDER THE SKIN DAILY. 15 mL 5  . levothyroxine (SYNTHROID) 50 MCG tablet TAKE 1 TABLET BY MOUTH ONCE DAILY BEFOREBREAKFAST 90 tablet 1  . Multiple Vitamin (MULTIVITAMIN) tablet Take 1 tablet by mouth daily.    . Omega-3 Fatty Acids (FISH OIL) 1000 MG CAPS Take 1 capsule by mouth 2 (two) times a day.     Letta Pate ULTRA test strip UUD TO CHECK BLOOD SUGAR 3 TIMES DAILY 300 each 11  . torsemide (DEMADEX) 20 MG tablet Take 1 tablet (20 mg total) by mouth 2 (two) times daily. 60 tablet 0  . traMADol (ULTRAM) 50 MG tablet Take 1-2 tablets (50-100 mg total) by mouth at bedtime as needed. 60 tablet 1  . calcitRIOL (ROCALTROL) 0.25 MCG capsule Take 0.25 mcg by mouth daily. (Patient not taking: Reported on 01/26/2021)    . nitroGLYCERIN (NITROSTAT) 0.4 MG SL tablet Place 1 tablet (0.4 mg total) under the tongue every 5 (five) minutes as needed for chest pain. 25 tablet 3  . rosuvastatin (CRESTOR) 10 MG tablet TAKE 1 TABLET BY MOUTH  ONCE DAILY (Patient not taking: Reported on 01/26/2021) 90 tablet 1   No current facility-administered medications on file prior to visit.    Allergies: No Known Allergies  Vital Signs:  BP (!) 143/73 (BP Location: Left Arm, Patient Position: Sitting, Cuff Size: Normal)   Pulse 72   Ht 5\' 8"  (1.727 m)   Wt 270 lb 9.6 oz (122.7 kg)   SpO2 95%   BMI 41.14 kg/m   Neurological Exam: MENTAL STATUS including orientation to time, place, person, recent and remote memory, attention span and concentration, language, and fund of knowledge is normal.  Speech is not dysarthric.  MOTOR:  Motor strength is 5/5 in the RUE and RLE,  LUE with 4/5 finger extensors (old), and 2/5 left dorsoflexion, proximal strength is 5/5.  No pronator  drift.  Tone is normal.    SENSORY:  Vibration absent at the ankles, intact at the knees and MCP.  COORDINATION/GAIT:  Left foot drop, steppage gait on the left, unassisted.  She is wearing CAM boot.   Data: Lab Results  Component Value Date   CREATININE 3.02 (H) 01/24/2021   BUN 48 (H) 01/24/2021   NA 142 01/24/2021   K 4.6 01/24/2021   CL 96 01/24/2021   CO2 35 (H) 01/24/2021   Lab Results  Component Value Date   HGBA1C 8.1 (H) 11/25/2020     IMPRESSION/PLAN: Diabetic polyradiculoneuropathy with left foot drop and distal painful paresthesias  - Continue Cymbalta 30mg  daily  - Rising creatinine (3.02), renally dose gabapentin - reduce to 300mg  BID  - Strongly counseled on diabetes management with lifestyle changes, namely diet  Return to clinic in 6 months   Thank you for allowing me to participate in patient's care.  If I can answer any additional questions, I would be pleased to do so.    Sincerely,    Shaneece Stockburger K. Allena Katz, DO

## 2021-01-28 ENCOUNTER — Other Ambulatory Visit: Payer: Self-pay

## 2021-01-28 ENCOUNTER — Encounter (HOSPITAL_COMMUNITY): Payer: Self-pay | Admitting: Emergency Medicine

## 2021-01-28 ENCOUNTER — Inpatient Hospital Stay (HOSPITAL_COMMUNITY)
Admission: EM | Admit: 2021-01-28 | Discharge: 2021-02-06 | DRG: 617 | Disposition: A | Discharge status: Home Health | Payer: HMO | Attending: Family Medicine | Admitting: Family Medicine

## 2021-01-28 ENCOUNTER — Encounter: Payer: Self-pay | Admitting: Podiatry

## 2021-01-28 ENCOUNTER — Emergency Department (HOSPITAL_COMMUNITY): Payer: HMO

## 2021-01-28 DIAGNOSIS — Z8611 Personal history of tuberculosis: Secondary | ICD-10-CM

## 2021-01-28 DIAGNOSIS — E1129 Type 2 diabetes mellitus with other diabetic kidney complication: Secondary | ICD-10-CM | POA: Diagnosis present

## 2021-01-28 DIAGNOSIS — L97529 Non-pressure chronic ulcer of other part of left foot with unspecified severity: Secondary | ICD-10-CM | POA: Diagnosis not present

## 2021-01-28 DIAGNOSIS — Z20822 Contact with and (suspected) exposure to covid-19: Secondary | ICD-10-CM | POA: Diagnosis not present

## 2021-01-28 DIAGNOSIS — E1122 Type 2 diabetes mellitus with diabetic chronic kidney disease: Secondary | ICD-10-CM | POA: Diagnosis present

## 2021-01-28 DIAGNOSIS — E11621 Type 2 diabetes mellitus with foot ulcer: Secondary | ICD-10-CM | POA: Diagnosis not present

## 2021-01-28 DIAGNOSIS — Z79890 Hormone replacement therapy (postmenopausal): Secondary | ICD-10-CM

## 2021-01-28 DIAGNOSIS — E1140 Type 2 diabetes mellitus with diabetic neuropathy, unspecified: Secondary | ICD-10-CM | POA: Diagnosis not present

## 2021-01-28 DIAGNOSIS — Z9889 Other specified postprocedural states: Secondary | ICD-10-CM

## 2021-01-28 DIAGNOSIS — I5042 Chronic combined systolic (congestive) and diastolic (congestive) heart failure: Secondary | ICD-10-CM | POA: Diagnosis not present

## 2021-01-28 DIAGNOSIS — I130 Hypertensive heart and chronic kidney disease with heart failure and stage 1 through stage 4 chronic kidney disease, or unspecified chronic kidney disease: Secondary | ICD-10-CM | POA: Diagnosis not present

## 2021-01-28 DIAGNOSIS — G4733 Obstructive sleep apnea (adult) (pediatric): Secondary | ICD-10-CM | POA: Diagnosis present

## 2021-01-28 DIAGNOSIS — I4891 Unspecified atrial fibrillation: Secondary | ICD-10-CM | POA: Diagnosis present

## 2021-01-28 DIAGNOSIS — I2510 Atherosclerotic heart disease of native coronary artery without angina pectoris: Secondary | ICD-10-CM | POA: Diagnosis not present

## 2021-01-28 DIAGNOSIS — E1142 Type 2 diabetes mellitus with diabetic polyneuropathy: Secondary | ICD-10-CM | POA: Diagnosis present

## 2021-01-28 DIAGNOSIS — E7800 Pure hypercholesterolemia, unspecified: Secondary | ICD-10-CM | POA: Diagnosis not present

## 2021-01-28 DIAGNOSIS — L02612 Cutaneous abscess of left foot: Secondary | ICD-10-CM | POA: Diagnosis present

## 2021-01-28 DIAGNOSIS — Z8042 Family history of malignant neoplasm of prostate: Secondary | ICD-10-CM

## 2021-01-28 DIAGNOSIS — L03116 Cellulitis of left lower limb: Secondary | ICD-10-CM | POA: Diagnosis present

## 2021-01-28 DIAGNOSIS — I428 Other cardiomyopathies: Secondary | ICD-10-CM | POA: Diagnosis present

## 2021-01-28 DIAGNOSIS — I447 Left bundle-branch block, unspecified: Secondary | ICD-10-CM | POA: Diagnosis present

## 2021-01-28 DIAGNOSIS — Z794 Long term (current) use of insulin: Secondary | ICD-10-CM

## 2021-01-28 DIAGNOSIS — IMO0002 Reserved for concepts with insufficient information to code with codable children: Secondary | ICD-10-CM | POA: Diagnosis present

## 2021-01-28 DIAGNOSIS — L089 Local infection of the skin and subcutaneous tissue, unspecified: Secondary | ICD-10-CM | POA: Diagnosis not present

## 2021-01-28 DIAGNOSIS — N184 Chronic kidney disease, stage 4 (severe): Secondary | ICD-10-CM | POA: Diagnosis not present

## 2021-01-28 DIAGNOSIS — M86172 Other acute osteomyelitis, left ankle and foot: Secondary | ICD-10-CM | POA: Diagnosis not present

## 2021-01-28 DIAGNOSIS — M19072 Primary osteoarthritis, left ankle and foot: Secondary | ICD-10-CM | POA: Diagnosis not present

## 2021-01-28 DIAGNOSIS — S91302A Unspecified open wound, left foot, initial encounter: Secondary | ICD-10-CM | POA: Diagnosis not present

## 2021-01-28 DIAGNOSIS — Z825 Family history of asthma and other chronic lower respiratory diseases: Secondary | ICD-10-CM

## 2021-01-28 DIAGNOSIS — E1151 Type 2 diabetes mellitus with diabetic peripheral angiopathy without gangrene: Secondary | ICD-10-CM | POA: Diagnosis not present

## 2021-01-28 DIAGNOSIS — E11628 Type 2 diabetes mellitus with other skin complications: Secondary | ICD-10-CM | POA: Diagnosis not present

## 2021-01-28 DIAGNOSIS — Z89412 Acquired absence of left great toe: Secondary | ICD-10-CM | POA: Diagnosis not present

## 2021-01-28 DIAGNOSIS — N2581 Secondary hyperparathyroidism of renal origin: Secondary | ICD-10-CM | POA: Diagnosis present

## 2021-01-28 DIAGNOSIS — Z7902 Long term (current) use of antithrombotics/antiplatelets: Secondary | ICD-10-CM

## 2021-01-28 DIAGNOSIS — Z6841 Body Mass Index (BMI) 40.0 and over, adult: Secondary | ICD-10-CM | POA: Diagnosis not present

## 2021-01-28 DIAGNOSIS — Z8249 Family history of ischemic heart disease and other diseases of the circulatory system: Secondary | ICD-10-CM

## 2021-01-28 DIAGNOSIS — B952 Enterococcus as the cause of diseases classified elsewhere: Secondary | ICD-10-CM | POA: Diagnosis present

## 2021-01-28 DIAGNOSIS — T82856A Stenosis of peripheral vascular stent, initial encounter: Secondary | ICD-10-CM | POA: Diagnosis not present

## 2021-01-28 DIAGNOSIS — E1169 Type 2 diabetes mellitus with other specified complication: Secondary | ICD-10-CM | POA: Diagnosis not present

## 2021-01-28 DIAGNOSIS — I252 Old myocardial infarction: Secondary | ICD-10-CM

## 2021-01-28 DIAGNOSIS — I5022 Chronic systolic (congestive) heart failure: Secondary | ICD-10-CM | POA: Diagnosis not present

## 2021-01-28 DIAGNOSIS — M869 Osteomyelitis, unspecified: Secondary | ICD-10-CM | POA: Diagnosis not present

## 2021-01-28 DIAGNOSIS — Y718 Miscellaneous cardiovascular devices associated with adverse incidents, not elsewhere classified: Secondary | ICD-10-CM | POA: Diagnosis not present

## 2021-01-28 DIAGNOSIS — E875 Hyperkalemia: Secondary | ICD-10-CM | POA: Diagnosis not present

## 2021-01-28 DIAGNOSIS — M86171 Other acute osteomyelitis, right ankle and foot: Secondary | ICD-10-CM | POA: Diagnosis not present

## 2021-01-28 DIAGNOSIS — Z8673 Personal history of transient ischemic attack (TIA), and cerebral infarction without residual deficits: Secondary | ICD-10-CM

## 2021-01-28 DIAGNOSIS — M86672 Other chronic osteomyelitis, left ankle and foot: Secondary | ICD-10-CM | POA: Diagnosis not present

## 2021-01-28 DIAGNOSIS — Z8261 Family history of arthritis: Secondary | ICD-10-CM

## 2021-01-28 DIAGNOSIS — E039 Hypothyroidism, unspecified: Secondary | ICD-10-CM | POA: Diagnosis present

## 2021-01-28 DIAGNOSIS — L97522 Non-pressure chronic ulcer of other part of left foot with fat layer exposed: Secondary | ICD-10-CM | POA: Diagnosis not present

## 2021-01-28 DIAGNOSIS — Z79899 Other long term (current) drug therapy: Secondary | ICD-10-CM

## 2021-01-28 DIAGNOSIS — L97521 Non-pressure chronic ulcer of other part of left foot limited to breakdown of skin: Secondary | ICD-10-CM | POA: Diagnosis not present

## 2021-01-28 DIAGNOSIS — M21372 Foot drop, left foot: Secondary | ICD-10-CM | POA: Diagnosis present

## 2021-01-28 DIAGNOSIS — Z807 Family history of other malignant neoplasms of lymphoid, hematopoietic and related tissues: Secondary | ICD-10-CM

## 2021-01-28 DIAGNOSIS — J189 Pneumonia, unspecified organism: Secondary | ICD-10-CM | POA: Diagnosis not present

## 2021-01-28 DIAGNOSIS — I10 Essential (primary) hypertension: Secondary | ICD-10-CM | POA: Diagnosis present

## 2021-01-28 DIAGNOSIS — Z95810 Presence of automatic (implantable) cardiac defibrillator: Secondary | ICD-10-CM

## 2021-01-28 DIAGNOSIS — I739 Peripheral vascular disease, unspecified: Secondary | ICD-10-CM | POA: Diagnosis not present

## 2021-01-28 DIAGNOSIS — M7989 Other specified soft tissue disorders: Secondary | ICD-10-CM | POA: Diagnosis not present

## 2021-01-28 DIAGNOSIS — E1165 Type 2 diabetes mellitus with hyperglycemia: Secondary | ICD-10-CM | POA: Diagnosis present

## 2021-01-28 DIAGNOSIS — L97509 Non-pressure chronic ulcer of other part of unspecified foot with unspecified severity: Secondary | ICD-10-CM | POA: Diagnosis present

## 2021-01-28 DIAGNOSIS — Z7984 Long term (current) use of oral hypoglycemic drugs: Secondary | ICD-10-CM | POA: Diagnosis not present

## 2021-01-28 DIAGNOSIS — Z833 Family history of diabetes mellitus: Secondary | ICD-10-CM

## 2021-01-28 DIAGNOSIS — L0390 Cellulitis, unspecified: Secondary | ICD-10-CM | POA: Diagnosis not present

## 2021-01-28 LAB — BASIC METABOLIC PANEL
Anion gap: 8 (ref 5–15)
BUN: 55 mg/dL — ABNORMAL HIGH (ref 8–23)
CO2: 33 mmol/L — ABNORMAL HIGH (ref 22–32)
Calcium: 8.9 mg/dL (ref 8.9–10.3)
Chloride: 94 mmol/L — ABNORMAL LOW (ref 98–111)
Creatinine, Ser: 2.87 mg/dL — ABNORMAL HIGH (ref 0.44–1.00)
GFR, Estimated: 18 mL/min — ABNORMAL LOW (ref >60)
Glucose, Bld: 331 mg/dL — ABNORMAL HIGH (ref 70–99)
Potassium: 4.3 mmol/L (ref 3.5–5.1)
Sodium: 135 mmol/L (ref 135–145)

## 2021-01-28 LAB — CBC WITH DIFFERENTIAL/PLATELET
Abs Immature Granulocytes: 0.06 10*3/uL (ref 0.00–0.07)
Basophils Absolute: 0 10*3/uL (ref 0.0–0.1)
Basophils Relative: 0 %
Eosinophils Absolute: 0.1 10*3/uL (ref 0.0–0.5)
Eosinophils Relative: 1 %
HCT: 35.7 % — ABNORMAL LOW (ref 36.0–46.0)
Hemoglobin: 11.5 g/dL — ABNORMAL LOW (ref 12.0–15.0)
Immature Granulocytes: 1 %
Lymphocytes Relative: 13 %
Lymphs Abs: 1.5 10*3/uL (ref 0.7–4.0)
MCH: 31.2 pg (ref 26.0–34.0)
MCHC: 32.2 g/dL (ref 30.0–36.0)
MCV: 96.7 fL (ref 80.0–100.0)
Monocytes Absolute: 0.7 10*3/uL (ref 0.1–1.0)
Monocytes Relative: 6 %
Neutro Abs: 9.5 10*3/uL — ABNORMAL HIGH (ref 1.7–7.7)
Neutrophils Relative %: 79 %
Platelets: 179 10*3/uL (ref 150–400)
RBC: 3.69 MIL/uL — ABNORMAL LOW (ref 3.87–5.11)
RDW: 14 % (ref 11.5–15.5)
WBC: 11.9 10*3/uL — ABNORMAL HIGH (ref 4.0–10.5)
nRBC: 0 % (ref 0.0–0.2)

## 2021-01-28 LAB — LACTIC ACID, PLASMA
Lactic Acid, Venous: 1.5 mmol/L (ref 0.5–1.9)
Lactic Acid, Venous: 1.2 mmol/L (ref 0.5–1.9)

## 2021-01-28 LAB — CBC
HCT: 33.3 % — ABNORMAL LOW (ref 36.0–46.0)
Hemoglobin: 10.7 g/dL — ABNORMAL LOW (ref 12.0–15.0)
MCH: 30.7 pg (ref 26.0–34.0)
MCHC: 32.1 g/dL (ref 30.0–36.0)
MCV: 95.7 fL (ref 80.0–100.0)
Platelets: 166 10*3/uL (ref 150–400)
RBC: 3.48 MIL/uL — ABNORMAL LOW (ref 3.87–5.11)
RDW: 14.1 % (ref 11.5–15.5)
WBC: 11.4 10*3/uL — ABNORMAL HIGH (ref 4.0–10.5)
nRBC: 0 % (ref 0.0–0.2)

## 2021-01-28 LAB — CREATININE, SERUM
Creatinine, Ser: 2.75 mg/dL — ABNORMAL HIGH (ref 0.44–1.00)
GFR, Estimated: 18 mL/min — ABNORMAL LOW (ref >60)

## 2021-01-28 LAB — RESP PANEL BY RT-PCR (FLU A&B, COVID) ARPGX2
Influenza A by PCR: NEGATIVE
Influenza B by PCR: NEGATIVE
SARS Coronavirus 2 by RT PCR: NEGATIVE

## 2021-01-28 LAB — GLUCOSE, CAPILLARY: Glucose-Capillary: 269 mg/dL — ABNORMAL HIGH (ref 70–99)

## 2021-01-28 MED ORDER — CLOPIDOGREL BISULFATE 75 MG PO TABS
75.0000 mg | ORAL_TABLET | Freq: Every day | ORAL | Status: DC
  Administered 2021-01-29 – 2021-02-06 (×7): 75 mg via ORAL
  Filled 2021-01-28 (×7): qty 1

## 2021-01-28 MED ORDER — SODIUM CHLORIDE 0.9 % IV SOLN
2.0000 g | INTRAVENOUS | Status: DC
  Administered 2021-01-28 – 2021-01-30 (×3): 2 g via INTRAVENOUS
  Filled 2021-01-28 (×4): qty 2

## 2021-01-28 MED ORDER — VANCOMYCIN HCL 2000 MG/400ML IV SOLN
2000.0000 mg | Freq: Once | INTRAVENOUS | Status: AC
  Administered 2021-01-28: 2000 mg via INTRAVENOUS
  Filled 2021-01-28: qty 400

## 2021-01-28 MED ORDER — INSULIN GLARGINE 100 UNIT/ML ~~LOC~~ SOLN
22.0000 [IU] | Freq: Every day | SUBCUTANEOUS | Status: DC
  Administered 2021-01-28 – 2021-02-05 (×9): 22 [IU] via SUBCUTANEOUS
  Filled 2021-01-28 (×11): qty 0.22

## 2021-01-28 MED ORDER — LOSARTAN POTASSIUM 25 MG PO TABS
12.5000 mg | ORAL_TABLET | Freq: Every morning | ORAL | Status: DC
  Administered 2021-01-29 – 2021-02-04 (×5): 12.5 mg via ORAL
  Filled 2021-01-28 (×5): qty 1

## 2021-01-28 MED ORDER — INSULIN ASPART 100 UNIT/ML ~~LOC~~ SOLN
0.0000 [IU] | Freq: Every day | SUBCUTANEOUS | Status: DC
  Administered 2021-01-28: 3 [IU] via SUBCUTANEOUS
  Administered 2021-01-31: 4 [IU] via SUBCUTANEOUS
  Administered 2021-02-03: 5 [IU] via SUBCUTANEOUS
  Administered 2021-02-04 – 2021-02-05 (×2): 2 [IU] via SUBCUTANEOUS

## 2021-01-28 MED ORDER — AMIODARONE HCL 200 MG PO TABS
200.0000 mg | ORAL_TABLET | Freq: Every morning | ORAL | Status: DC
  Administered 2021-01-29 – 2021-02-06 (×7): 200 mg via ORAL
  Filled 2021-01-28 (×7): qty 1

## 2021-01-28 MED ORDER — SODIUM CHLORIDE 0.9 % IV BOLUS
1000.0000 mL | Freq: Once | INTRAVENOUS | Status: AC
  Administered 2021-01-28: 1000 mL via INTRAVENOUS

## 2021-01-28 MED ORDER — PIPERACILLIN-TAZOBACTAM 3.375 G IVPB
3.3750 g | Freq: Three times a day (TID) | INTRAVENOUS | Status: DC
  Filled 2021-01-28: qty 50

## 2021-01-28 MED ORDER — GABAPENTIN 300 MG PO CAPS
300.0000 mg | ORAL_CAPSULE | Freq: Two times a day (BID) | ORAL | Status: DC
  Administered 2021-01-28 – 2021-02-01 (×8): 300 mg via ORAL
  Filled 2021-01-28 (×9): qty 1

## 2021-01-28 MED ORDER — INSULIN GLARGINE 100 UNIT/ML SOLOSTAR PEN: 22.0000 [IU] | PEN_INJECTOR | Freq: Every day | SUBCUTANEOUS | Status: DC

## 2021-01-28 MED ORDER — TRAMADOL HCL 50 MG PO TABS
50.0000 mg | ORAL_TABLET | Freq: Four times a day (QID) | ORAL | Status: DC | PRN
  Administered 2021-01-29 – 2021-02-02 (×5): 50 mg via ORAL
  Filled 2021-01-28 (×6): qty 1

## 2021-01-28 MED ORDER — CARVEDILOL 3.125 MG PO TABS
3.1250 mg | ORAL_TABLET | Freq: Every day | ORAL | Status: DC
  Administered 2021-01-29 – 2021-02-06 (×8): 3.125 mg via ORAL
  Filled 2021-01-28 (×8): qty 1

## 2021-01-28 MED ORDER — PIPERACILLIN-TAZOBACTAM 3.375 G IVPB 30 MIN
3.3750 g | Freq: Once | INTRAVENOUS | Status: DC
  Administered 2021-01-28: 3.375 g via INTRAVENOUS

## 2021-01-28 MED ORDER — HEPARIN SODIUM (PORCINE) 5000 UNIT/ML IJ SOLN
5000.0000 [IU] | Freq: Three times a day (TID) | INTRAMUSCULAR | Status: DC
  Administered 2021-01-28 – 2021-02-02 (×11): 5000 [IU] via SUBCUTANEOUS
  Filled 2021-01-28 (×12): qty 1

## 2021-01-28 MED ORDER — DULOXETINE HCL 30 MG PO CPEP
30.0000 mg | ORAL_CAPSULE | Freq: Every day | ORAL | Status: DC
  Administered 2021-01-28 – 2021-02-05 (×9): 30 mg via ORAL
  Filled 2021-01-28 (×9): qty 1

## 2021-01-28 MED ORDER — SENNOSIDES-DOCUSATE SODIUM 8.6-50 MG PO TABS: 1.0000 | ORAL_TABLET | Freq: Every evening | ORAL | Status: DC | PRN

## 2021-01-28 MED ORDER — LACTATED RINGERS IV SOLN: INTRAVENOUS | Status: DC

## 2021-01-28 MED ORDER — LEVOTHYROXINE SODIUM 50 MCG PO TABS
50.0000 ug | ORAL_TABLET | Freq: Every day | ORAL | Status: DC
  Administered 2021-01-29 – 2021-02-06 (×9): 50 ug via ORAL
  Filled 2021-01-28 (×9): qty 1

## 2021-01-28 MED ORDER — VANCOMYCIN HCL 1500 MG/300ML IV SOLN
1500.0000 mg | INTRAVENOUS | Status: DC
  Administered 2021-01-30 – 2021-02-03 (×3): 1500 mg via INTRAVENOUS
  Filled 2021-01-28 (×4): qty 300

## 2021-01-28 MED ORDER — TORSEMIDE 20 MG PO TABS
20.0000 mg | ORAL_TABLET | Freq: Two times a day (BID) | ORAL | Status: DC
  Administered 2021-01-29 – 2021-02-02 (×9): 20 mg via ORAL
  Filled 2021-01-28 (×11): qty 1

## 2021-01-28 MED ORDER — ACETAMINOPHEN 325 MG PO TABS: 650.0000 mg | ORAL_TABLET | Freq: Four times a day (QID) | ORAL | Status: DC | PRN

## 2021-01-28 MED ORDER — ONDANSETRON HCL 4 MG PO TABS: 4.0000 mg | ORAL_TABLET | Freq: Four times a day (QID) | ORAL | Status: DC | PRN

## 2021-01-28 MED ORDER — EMPAGLIFLOZIN 10 MG PO TABS
10.0000 mg | ORAL_TABLET | Freq: Every day | ORAL | Status: DC
  Administered 2021-01-29 – 2021-02-01 (×4): 10 mg via ORAL
  Filled 2021-01-28 (×4): qty 1

## 2021-01-28 MED ORDER — ISOSORBIDE MONONITRATE ER 30 MG PO TB24
30.0000 mg | ORAL_TABLET | Freq: Every day | ORAL | Status: DC
  Administered 2021-01-29 – 2021-02-06 (×7): 30 mg via ORAL
  Filled 2021-01-28 (×7): qty 1

## 2021-01-28 MED ORDER — ONDANSETRON HCL 4 MG/2ML IJ SOLN: 4.0000 mg | Freq: Four times a day (QID) | INTRAMUSCULAR | Status: DC | PRN

## 2021-01-28 MED ORDER — INSULIN ASPART 100 UNIT/ML ~~LOC~~ SOLN
0.0000 [IU] | Freq: Three times a day (TID) | SUBCUTANEOUS | Status: DC
  Administered 2021-01-29: 4 [IU] via SUBCUTANEOUS
  Administered 2021-01-29 (×2): 3 [IU] via SUBCUTANEOUS
  Administered 2021-01-30: 4 [IU] via SUBCUTANEOUS
  Administered 2021-01-30: 7 [IU] via SUBCUTANEOUS
  Administered 2021-01-30: 3 [IU] via SUBCUTANEOUS
  Administered 2021-01-31: 7 [IU] via SUBCUTANEOUS
  Administered 2021-01-31: 3 [IU] via SUBCUTANEOUS
  Administered 2021-02-01: 4 [IU] via SUBCUTANEOUS
  Administered 2021-02-01: 7 [IU] via SUBCUTANEOUS
  Administered 2021-02-01: 4 [IU] via SUBCUTANEOUS
  Administered 2021-02-03 (×2): 7 [IU] via SUBCUTANEOUS
  Administered 2021-02-04: 15 [IU] via SUBCUTANEOUS
  Administered 2021-02-04 – 2021-02-05 (×3): 4 [IU] via SUBCUTANEOUS
  Administered 2021-02-05: 11 [IU] via SUBCUTANEOUS
  Administered 2021-02-05 – 2021-02-06 (×2): 3 [IU] via SUBCUTANEOUS
  Administered 2021-02-06: 11 [IU] via SUBCUTANEOUS

## 2021-01-28 MED ORDER — ACETAMINOPHEN 650 MG RE SUPP: 650.0000 mg | Freq: Four times a day (QID) | RECTAL | Status: DC | PRN

## 2021-01-28 NOTE — Progress Notes (Signed)
Pharmacy Antibiotic Note  GENEIVE SANDSTROM is a 65 y.o. female admitted on 01/28/2021 with cellulitis.  Pharmacy has been consulted for Zosyn dosing.  SCr elevated at 2.87. WBC 11.9   Plan: -Zosyn 3.375 gm IV Q 8 hours (EI infusion)  -Monitor CBC, renal fx, cultures and clinical progress  Height: 5\' 8"  (172.7 cm) Weight: 122.7 kg (270 lb 8.1 oz) IBW/kg (Calculated) : 63.9  Temp (24hrs), Avg:98.4 F (36.9 C), Min:98.4 F (36.9 C), Max:98.4 F (36.9 C)  Recent Labs  Lab 01/24/21 0953  CREATININE 3.02*    Estimated Creatinine Clearance: 25.3 mL/min (A) (by C-G formula based on SCr of 3.02 mg/dL (H)).    No Known Allergies  Antimicrobials this admission: Zosyn 4/24 >>  Vanc 4/24 >>   Dose adjustments this admission:   Microbiology results: 4/24 BCx:    Thank you for allowing pharmacy to be a part of this patient's care.  Albertina Parr, PharmD., BCPS, BCCCP Clinical Pharmacist Please refer to [facility] for unit-specific pharmacist

## 2021-01-28 NOTE — Progress Notes (Signed)
Pharmacy Antibiotic Note  [name] is a 65 y.o. female admitted on 01/28/2021 with LLE cellulitis.  Pharmacy has been consulted for Vancomycin  Dosing.  Vancomycin 2 g IV given in ED at 2100  Plan: Vancomycin 1500 mg IV q48h  Height: 5\' 8"  (172.7 cm) Weight: 122.7 kg (270 lb 8.1 oz) IBW/kg (Calculated) : 63.9  Temp (24hrs), Avg:98.4 F (36.9 C), Min:98.4 F (36.9 C), Max:98.4 F (36.9 C)  Recent Labs  Lab 01/24/21 0953 01/28/21 1935 01/28/21 2007 01/28/21 2310  WBC  --  11.9*  --  11.4*  CREATININE 3.02* 2.87*  --   --   LATICACIDVEN  --   --  1.5  --     Estimated Creatinine Clearance: 26.6 mL/min (A) (by C-G formula based on SCr of 2.87 mg/dL (H)).    No Known Allergies  Caryl Pina 01/28/2021 11:33 PM

## 2021-01-28 NOTE — Progress Notes (Signed)
Patient called on-call provider to let them know that her foot has gotten much worse and it's concerned about infection. She has been on antibiotics and taking them as directed but today she noticed that her foot was much more red and the redness was extending from the bottom of her foot to past midway on the top of her foot. She is pending an MRI to rule out osteomyelitis. She denies N/V/F/Ch. I advised her that given the worsening red ness and extent despite antibiotics she should proceed to the ER. Patient stated she will go to [facility] ED today.

## 2021-01-28 NOTE — H&P (Signed)
History and Physical    [name] NUU:[id number] DOB: 08-05-1956 DOA: 01/28/2021  PCP: Joaquim Nam, MD   Patient coming from: Home  Chief Complaint:  Worsening infection in foot.   HPI: [name] is a 65 y.o. female with medical history significant for DMT2 with neuropathy, CKD 4, HFrEF, CAD, has AICD, diabetic foot ulcer on left, HTN, HLD, morbid obesity who presents for evaluation of worsening infection in left foot.  Seen a month ago in the emergency room and placed on clindamycin for diabetic foot ulcer with cellulitis at that time.  She is followed up with podiatry as an outpatient and continued on the clindamycin for the last month.  Despite the antibiotic she has worsening swelling and redness of her left first toe and midfoot.  She continues to have a yellow purulent drainage from the diabetic foot ulcer on plantar surface of left foot at the first MP joint. Podiatry has been trying to get an MRI of her foot but she has a AICD which has complicated getting MRI as outpatient she states. She has not had fever or chills. She has a throbbing dull pain in left foot. Has neuropathy and denies sharp pain.  She reports her blood sugar has been running high for the last 1 to 2 weeks in the 180-300 range.  She had a hemoglobin A1c at the end of February which was 8.1.  Denies tobacco, alcohol, illicit drug use  ED Course:    Patient been hemodynamically stable in the emergency room.  She does have cellulitis of her left foot with diabetic foot ulcer with mild purulent drainage.  CBC is normal.  Renal function is at baseline.  Electrolytes are normal.  X-ray reveals swelling with gas at the base of the first left toe with possible abscess.  ER physician discussed with podiatry who request admission for IV antibiotics.  Podiatry will see patient in the morning and will make further recommendations.  Patient will likely need an MRI to make sure there is no osteomyelitis  Review of  Systems:  General: Denies weakness, fever, chills, weight loss, night sweats. Denies dizziness. Denies change in appetite HENT: Denies head trauma, headache, denies change in hearing, tinnitus. Denies nasal congestion or bleeding.  Denies sore throat, sores in mouth.  Denies difficulty swallowing Eyes: Denies blurry vision, pain in eye, drainage.  Denies discoloration of eyes. Neck: Denies pain.  Denies swelling.  Denies pain with movement. Cardiovascular: Denies chest pain, palpitations.  Denies edema.  Denies orthopnea Respiratory: Denies shortness of breath, cough.  Denies wheezing.  Denies sputum production Gastrointestinal: Denies abdominal pain, swelling.  Denies nausea, vomiting, diarrhea.  Denies melena.  Denies hematemesis. Musculoskeletal: Denies limitation of movement. Has ulcer of left plantar foot. No arthralgia or myalgias Genitourinary: Denies pelvic pain.  Denies urinary frequency or hesitancy.  Denies dysuria.  Skin: Has erythema of left first toe and midfoot with swelling. Denies rash.  Denies petechiae, purpura, ecchymosis. Neurological: Denies syncope. Denies seizure activity. Denies paresthesia. Denies slurred speech, drooping face.  Denies visual change. Psychiatric: Denies depression, anxiety. Denies hallucinations.  Past Medical History:  Diagnosis Date  . Acute myocardial infarction, subendocardial infarction, initial episode of care (HCC) 07/21/2012  . Acute osteomyelitis involving ankle and foot (HCC) 02/06/2015  . Acute systolic heart failure (HCC) 07/21/2012   New onset 07/19/12; admission to [facility] ED. Elevated Troponins.  S/p 2D-echo with EF 20-25%.  S/p cardiac catheterization with stenting LAD.  Repeat 2D-echo 10/2011 with  improved EF of 35%.   . Anemia   . Automatic implantable cardioverter-defibrillator in situ    a. MDT CRT-D 06/2014, SN: [id number]  .removed in feb 2020  . CAD (coronary artery disease)    a. cardiac cath 101/04/2012: PCI/DES to chronically  occluded mLAD, consideration PCI to diag branch in 4 weeks.   . Cataract   . Chicken pox   . Chronic kidney disease   . Chronic systolic CHF (congestive heart failure) (HCC)    a. mixed ICM & NICM; b. EF 20-25% by echo 07/2012, mid-dist 2/3 of LV sev HK/AK, mild MR. echo 10/2012: EF 30-35%, sev HK ant-septal & inf walls, GR1DD, mild MR, PASP 33. c. echo 02/2013: EF 30%, GR1DD, mild MR. echo 04/2014: EF 30%, Septal-lat dyssynchrony, global HK, inf AK, GR1DD, mild MR. d. echo 10/2014: EF50-55%, WM nl, GR1DD, septal mild paradox. e. echo 02/2015: EF 50-55%, wm   . Depression   . Heart attack (HCC)   . Heel ulcer (HCC) 04/27/2015  . History of blood transfusion ~ 2011   "plasma; had neuropathy; couldn't walk"  . Hypertension   . LBBB (left bundle branch block)   . Neuromuscular disorder (HCC)   . Neuropathy 2011  . Obesity, unspecified   . OSA on CPAP    Moderate with AHI 23/hr and now on CPAP at 16cm H2O.  Her DME is [facility]  . Pure hypercholesterolemia   . Sepsis (HCC) 09/2018  . Type II diabetes mellitus (HCC)   . Unspecified vitamin D deficiency     Past Surgical History:  Procedure Laterality Date  . AMPUTATION TOE Right 06/18/2015   Procedure: AMPUTATION TOE;  Surgeon: Gwyneth Revels, DPM;  Location: [facility] ORS;  Service: Podiatry;  Laterality: Right;  . AMPUTATION TOE Left 10/08/2018   Procedure: AMPUTATION TOE LEFT 2ND;  Surgeon: Recardo Evangelist, DPM;  Location: [facility] ORS;  Service: Podiatry;  Laterality: Left;  . BACK SURGERY    . BI-VENTRICULAR IMPLANTABLE CARDIOVERTER DEFIBRILLATOR N/A 07/06/2014   Procedure: BI-VENTRICULAR IMPLANTABLE CARDIOVERTER DEFIBRILLATOR  (CRT-D);  Surgeon: Duke Salvia, MD;  Location: [facility] CATH LAB;  Service: Cardiovascular;  Laterality: N/A;  . BI-VENTRICULAR IMPLANTABLE CARDIOVERTER DEFIBRILLATOR  (CRT-D)  07/06/2014  . BILATERAL OOPHORECTOMY  01/2011   ovarian cyst benign  . BIV ICD INSERTION CRT-D N/A 03/31/2019   Procedure: BIV ICD INSERTION CRT-D;  Surgeon:  Regan Lemming, MD;  Location: [facility] INVASIVE CV LAB;  Service: Cardiovascular;  Laterality: N/A;  . COLONOSCOPY WITH PROPOFOL Left 02/22/2015   Procedure: COLONOSCOPY WITH PROPOFOL;  Surgeon: Wallace Cullens, MD;  Location: [facility] ENDOSCOPY;  Service: Endoscopy;  Laterality: Left;  . CORONARY ANGIOPLASTY WITH STENT PLACEMENT Left 07/2012   new onset systolic CHF; elevated troponins.  Cardiac catheterization with stenting to LAD; EF 15%.  2D-echo: EF 20-25%.  . ESOPHAGOGASTRODUODENOSCOPY N/A 02/22/2015   Procedure: ESOPHAGOGASTRODUODENOSCOPY (EGD);  Surgeon: Wallace Cullens, MD;  Location: [facility] ENDOSCOPY;  Service: Endoscopy;  Laterality: N/A;  . I & D EXTREMITY Left 11/28/2020   Procedure: IRRIGATION AND DEBRIDEMENT OF FOOT;  Surgeon: Vivi Barrack, DPM;  Location: [facility] OR;  Service: Podiatry;  Laterality: Left;  . INCISION AND DRAINAGE ABSCESS Right 2007   groin; with ICU stay due to sepsis.  Marland Kitchen LAPAROSCOPIC CHOLECYSTECTOMY  2011  . LEFT HEART CATH AND CORONARY ANGIOGRAPHY N/A 10/05/2018   Procedure: LEFT HEART CATH AND CORONARY ANGIOGRAPHY;  Surgeon: Antonieta Iba, MD;  Location: [facility] INVASIVE CV LAB;  Service: Cardiovascular;  Laterality: N/A;  .  LEFT HEART CATHETERIZATION WITH CORONARY ANGIOGRAM N/A 07/21/2012   Procedure: LEFT HEART CATHETERIZATION WITH CORONARY ANGIOGRAM;  Surgeon: Dolores Patty, MD;  Location: [facility] CATH LAB;  Service: Cardiovascular;  Laterality: N/A;  . LUMBAR DISC SURGERY  1996   L4-5  . PACEMAKER REMOVAL  11/2018   due to infection around pacemaker  . PERCUTANEOUS CORONARY STENT INTERVENTION (PCI-S) N/A 07/23/2012   Procedure: PERCUTANEOUS CORONARY STENT INTERVENTION (PCI-S);  Surgeon: Tonny Bollman, MD;  Location: [facility] CATH LAB;  Service: Cardiovascular;  Laterality: N/A;  . PERIPHERAL VASCULAR BALLOON ANGIOPLASTY Left 10/06/2018   Procedure: PERIPHERAL VASCULAR BALLOON ANGIOPLASTY;  Surgeon: Renford Dills, MD;  Location: [facility] INVASIVE CV LAB;  Service:  Cardiovascular;  Laterality: Left;  . PERIPHERAL VASCULAR CATHETERIZATION N/A 02/10/2015   Procedure: Picc Line Insertion;  Surgeon: Renford Dills, MD;  Location: [facility] INVASIVE CV LAB;  Service: Cardiovascular;  Laterality: N/A;  . RIGHT HEART CATH N/A 03/29/2019   Procedure: RIGHT HEART CATH;  Surgeon: Dolores Patty, MD;  Location: [facility] INVASIVE CV LAB;  Service: Cardiovascular;  Laterality: N/A;  . TEE WITHOUT CARDIOVERSION N/A 10/02/2018   Procedure: TRANSESOPHAGEAL ECHOCARDIOGRAM (TEE);  Surgeon: Iran Ouch, MD;  Location: [facility] ORS;  Service: Cardiovascular;  Laterality: N/A;  . TUBAL LIGATION  1981  . VAGINAL HYSTERECTOMY  01/2011   Fibroids/DUB.  Ovaries removed. [place].    Social History  reports that she has never smoked. She has never used smokeless tobacco. She reports that she does not drink alcohol and does not use drugs.  No Known Allergies  Family History  Problem Relation Age of Onset  . Diabetes Mother   . Hypertension Mother   . Arthritis Mother        knees, lumbar DDD, cervical DDD  . Cancer Father        prostate,skin,lymphoma.  . Cancer Brother 27       bladder cancer; non-smoker  . Diabetes Maternal Grandmother   . Heart disease Maternal Grandmother   . COPD Maternal Grandmother   . Diabetes Paternal Grandmother   . Obesity Brother   . Diabetes Son   . Hypertension Son   . Cancer Maternal Grandfather   . Diabetes Paternal Grandfather      Prior to Admission medications   Medication Sig Start Date End Date Taking? Authorizing Provider  amiodarone (PACERONE) 200 MG tablet Take 1 tablet (200 mg total) by mouth daily. 10/30/20   Bensimhon, Bevelyn Buckles, MD  calcitRIOL (ROCALTROL) 0.25 MCG capsule Take 0.25 mcg by mouth daily. Patient not taking: Reported on 01/26/2021 11/14/20   [provider]  calcitRIOL (ROCALTROL) 0.5 MCG capsule Take 0.5 mcg by mouth daily. 12/25/20   [provider]  Calcium Carb-Cholecalciferol (CALCIUM  CARBONATE-VITAMIN D3) 600-400 MG-UNIT TABS Take 1 tablet by mouth 2 (two) times a day.    [provider]  carvedilol (COREG) 3.125 MG tablet Take 3.125 mg by mouth daily.    [provider]  clopidogrel (PLAVIX) 75 MG tablet TAKE 1 TABLET BY MOUTH ONCE DAILY 11/10/20   Antonieta Iba, MD  DULoxetine (CYMBALTA) 30 MG capsule Take 1 capsule (30 mg total) by mouth at bedtime. 01/26/21   Nita Sickle K, DO  empagliflozin (JARDIANCE) 10 MG TABS tablet Take 1 tablet (10 mg total) by mouth daily before breakfast. 10/16/20   Bensimhon, Bevelyn Buckles, MD  ferrous sulfate 325 (65 FE) MG tablet Take 325 mg by mouth 2 (two) times daily with a meal.  [provider]  gabapentin (NEURONTIN) 600 MG tablet TAKE 1 TABLET BY IN THE MORNING AND 2 ATBEDTIME 10/02/20   Joaquim Nam, MD  gentamicin cream (GARAMYCIN) 0.1 % Apply 1 application topically 2 (two) times daily. 01/22/21   Felecia Shelling, DPM  insulin aspart (NOVOLOG FLEXPEN) 100 UNIT/ML FlexPen Inject 5 Units into the skin 2 (two) times daily with a meal. 09/28/20   Joaquim Nam, MD  Insulin Pen Needle (PEN NEEDLES) 32G X 6 MM MISC 1 each by Does not apply route 4 (four) times daily. 09/06/19   Noni Saupe, MD  isosorbide mononitrate (IMDUR) 30 MG 24 hr tablet Take 1 tablet (30 mg total) by mouth daily. Needs appt 05/16/20   Bensimhon, Bevelyn Buckles, MD  LANTUS SOLOSTAR 100 UNIT/ML Solostar Pen INJECT 22 UNITS UNDER THE SKIN DAILY. 10/02/20   Joaquim Nam, MD  levothyroxine (SYNTHROID) 50 MCG tablet TAKE 1 TABLET BY MOUTH ONCE DAILY BEFOREBREAKFAST 10/30/20   Joaquim Nam, MD  Multiple Vitamin (MULTIVITAMIN) tablet Take 1 tablet by mouth daily.    [provider]  nitroGLYCERIN (NITROSTAT) 0.4 MG SL tablet Place 1 tablet (0.4 mg total) under the tongue every 5 (five) minutes as needed for chest pain. 03/28/20 06/26/20  Duke Salvia, MD  Omega-3 Fatty Acids (FISH OIL) 1000 MG CAPS Take 1 capsule by mouth 2  (two) times a day.     [provider]  ONETOUCH ULTRA test strip UUD TO CHECK BLOOD SUGAR 3 TIMES DAILY 03/02/20   Lezlie Lye, Meda Coffee, MD  rosuvastatin (CRESTOR) 10 MG tablet TAKE 1 TABLET BY MOUTH ONCE DAILY Patient not taking: Reported on 01/26/2021 11/15/20   Joaquim Nam, MD  torsemide (DEMADEX) 20 MG tablet Take 1 tablet (20 mg total) by mouth 2 (two) times daily. 11/30/20   Azucena Fallen, MD  traMADol (ULTRAM) 50 MG tablet Take 1-2 tablets (50-100 mg total) by mouth at bedtime as needed. 01/10/21   Joaquim Nam, MD    Physical Exam: Vitals:   01/28/21 1807 01/28/21 1941 01/28/21 1946  BP: (!) 133/56  (!) 136/58  Pulse: 69  64  Resp: 18  16  Temp: 98.4 F (36.9 C)    TempSrc: Oral    SpO2: 100%  97%  Weight:  122.7 kg   Height:  5\' 8"  (1.727 m)     Constitutional: NAD, calm, comfortable Vitals:   01/28/21 1807 01/28/21 1941 01/28/21 1946  BP: (!) 133/56  (!) 136/58  Pulse: 69  64  Resp: 18  16  Temp: 98.4 F (36.9 C)    TempSrc: Oral    SpO2: 100%  97%  Weight:  122.7 kg   Height:  5\' 8"  (1.727 m)    General: WDWN, Alert and oriented x3.  Eyes: EOMI, PERRL, conjunctivae normal.  Sclera nonicteric HENT:  [place]/AT, external ears normal.  Nares patent without epistasis.  Mucous membranes are moist.  Neck: Soft, normal range of motion, supple, no masses, Trachea midline Respiratory: clear to auscultation bilaterally, no wheezing, no crackles. Normal respiratory effort. No accessory muscle use.  Cardiovascular: Regular rate and rhythm, no murmurs / rubs / gallops. No extremity edema. 2+ pedal pulses. AICD in left upper chest.   Abdomen: Soft, no tenderness, nondistended, no rebound or guarding. Morbidly obese.  No masses palpated. Bowel sounds normoactive Musculoskeletal: FROM. S/p amputation of left second toe. no cyanosis. Normal muscle tone. Ulcer left foot plantar first MP joint. Mild  yellow drainage present. Skin: Warm, dry, intact no rashes, lesions.  Erythema left first toe and midfoot with induration. No fluctuation. Neurologic: CN 2-12 grossly intact.  Normal speech.  Sensation intact, patella DTR +1 bilaterally. Strength 5/5 in all extremities.   Psychiatric: Normal judgment and insight.  Normal mood.    Labs on Admission: I have personally reviewed following labs and imaging studies  CBC: Recent Labs  Lab 01/28/21 1935  WBC 11.9*  NEUTROABS 9.5*  HGB 11.5*  HCT 35.7*  MCV 96.7  PLT 179    Basic Metabolic Panel: Recent Labs  Lab 01/24/21 0953 01/28/21 1935  NA 142 135  K 4.6 4.3  CL 96 94*  CO2 35* 33*  GLUCOSE 104* 331*  BUN 48* 55*  CREATININE 3.02* 2.87*  CALCIUM 9.7 8.9    GFR: Estimated Creatinine Clearance: 26.6 mL/min (A) (by C-G formula based on SCr of 2.87 mg/dL (H)).  Liver Function Tests: No results for input(s): AST, ALT, ALKPHOS, BILITOT, PROT, ALBUMIN in the last 168 hours.  Urine analysis:    Component Value Date/Time   COLORURINE AMBER (A) 03/09/2019 2054   APPEARANCEUR TURBID (A) 03/09/2019 2054   LABSPEC 1.017 03/09/2019 2054   PHURINE 5.0 03/09/2019 2054   GLUCOSEU NEGATIVE 03/09/2019 2054   HGBUR LARGE (A) 03/09/2019 2054   BILIRUBINUR NEGATIVE 03/09/2019 2054   BILIRUBINUR negative 09/27/2017 1506   BILIRUBINUR neg 01/16/2015 1533   KETONESUR NEGATIVE 03/09/2019 2054   PROTEINUR 100 (A) 03/09/2019 2054   UROBILINOGEN 0.2 09/27/2017 1506   UROBILINOGEN 1.0 05/17/2010 0214   NITRITE NEGATIVE 03/09/2019 2054   LEUKOCYTESUR MODERATE (A) 03/09/2019 2054    Radiological Exams on Admission: DG Toe Great Left  Result Date: 01/28/2021 CLINICAL DATA:  Infection to the great toe for 3 weeks. On antibiotics. EXAM: LEFT GREAT TOE COMPARISON:  01/13/2021 FINDINGS: Previous amputation of the second toe at the metatarsal phalangeal joint level. Soft tissue swelling at the base of the first toe laterally, extending into the space between the first and second toes. Soft tissue gas is present.  Soft tissue swelling and gas or increasing since previous study, likely representing expanding abscess. No cortical erosion or bone sclerosis to suggest osteomyelitis. Degenerative changes in the intertarsal joints. IMPRESSION: 1. Soft tissue swelling and gas at the base of the first toe, increasing since prior study, likely representing expanding abscess. 2. No radiographic evidence of osteomyelitis. 3. Previous amputation of the second toe. Electronically Signed   By: Burman Nieves M.D.   On: 01/28/2021 19:31    Assessment/Plan Principal Problem:   Cellulitis of left foot [name] is admitted to Med/Surg floor.  Placed on vancomycin and cefepime for empiric antibiotic coverage.  Patient has been on clindamycin for the last month for infection but has worsened over that time. Pain control provided as needed. Podiatry has been consulted by the emergency room physician and will see patient in the morning. There is concern for osteomyelitis. X-ray reveals soft tissue swelling and gas at the base of the first toe of left foot.  Possible abscess. Patient will need MRI in the morning for further evaluation of osteomyelitis but she has a AICD need to verify can have the MRI with this pacemaker defibrillator.  Active Problems:   Diabetic peripheral neuropathy associated with type 2 diabetes mellitus  Chronic.    Essential hypertension, benign Continue Coreg and Imdur.  Monitor blood pressure    DM type 2, uncontrolled, with renal complications  Continue basal insulin with  lantus. Continue Jardiance. Monitor blood sugar with meals and at bedtime. SSI provided as needed for glycemic control.     Diabetic foot ulcer  Chronic and has not improved with outpatient therapy.  Consult wound care.     CKD (chronic kidney disease) stage 4, GFR 15-29 ml/min  Stable. Avoid nephrotoxic medications.     Chronic systolic congestive heart failure  Stable. Continue home meds once verified and  reconciled by pharmacy    AICD (automatic cardioverter/defibrillator) present     DVT prophylaxis: Heparin for DVT prophylaxis.  Code Status:   Full Code  Family Communication:    Diagnosis and plan discussed with patient.  Patient verbalized understanding and agrees with plan.  Further recommendations to follow as clinical indicated Disposition Plan:   Patient is from:  Home  Anticipated DC to:  Home  Anticipated DC date:  Anticipate more than 2 midnight stay in hospital.  Anticipated DC barriers: No barriers to discharge identified at this time  Consults called:  Podiatry, Dr. Samuella Cota was consulted by ER physician and will see pt in am Admission status:  Inpatient.   Claudean Severance Foxx Klarich MD [facility]  How to contact the [facility] Attending or Consulting provider 7A - 7P or covering provider during after hours 7P -7A, for this patient?   1. Check the care team in [facility] and look for a) attending/consulting [facility] provider listed and b) the [facility] team listed 2. Log into [email or web address] and use [place]'s universal password to access. If you do not have the password, please contact the hospital operator. 3. Locate the [facility] provider you are looking for under [facility] and page to a number that you can be directly reached. 4. If you still have difficulty reaching the provider, please page the [facility] (Director on Call) for the Hospitalists listed on amion for assistance.  01/28/2021, 9:54 PM

## 2021-01-28 NOTE — ED Provider Notes (Signed)
[place] EMERGENCY DEPARTMENT Provider Note   CSN: [id number] Arrival date & time: 01/28/21  1800     History Chief Complaint  Patient presents with  . Wound Infection    [name] is a 65 y.o. female history of CAD, heart failure, here presenting with wound infection.  Patient has a known left great toe ulcer. Patient has been managed with Dr. Amalia Hailey.  I saw her about a month ago.  Patient was placed on clindamycin with improvement.  However about a week ago, it got worse.  Patient saw Dr. Amalia Hailey again and was put on clindamycin again. She was scheduled for MRI outpatient but she has a pacemaker so she states that it has to be done in the hospital.  Patient states that she noticed more drainage and pain in the left big toe so came here for further evaluation. She called her podiatrist and was told that she may need IV antibiotics.  The history is provided by the patient.       Past Medical History:  Diagnosis Date  . Acute myocardial infarction, subendocardial infarction, initial episode of care ([place]) 07/21/2012  . Acute osteomyelitis involving ankle and foot ([place]) 02/06/2015  . Acute systolic heart failure ([place]) 07/21/2012   New onset 07/19/12; admission to [facility] ED. Elevated Troponins.  S/p 2D-echo with EF 20-25%.  S/p cardiac catheterization with stenting LAD.  Repeat 2D-echo 10/2011 with improved EF of 35%.   . Anemia   . Automatic implantable cardioverter-defibrillator in situ    a. MDT CRT-D 06/2014, SN: [id number]  .removed in feb 2020  . CAD (coronary artery disease)    a. cardiac cath 101/04/2012: PCI/DES to chronically occluded mLAD, consideration PCI to diag branch in 4 weeks.   . Cataract   . Chicken pox   . Chronic kidney disease   . Chronic systolic CHF (congestive heart failure) (HCC)    a. mixed ICM & NICM; b. EF 20-25% by echo 07/2012, mid-dist 2/3 of LV sev HK/AK, mild MR. echo 10/2012: EF 30-35%, sev HK ant-septal & inf walls, GR1DD,  mild MR, PASP 33. c. echo 02/2013: EF 30%, GR1DD, mild MR. echo 04/2014: EF 30%, Septal-lat dyssynchrony, global HK, inf AK, GR1DD, mild MR. d. echo 10/2014: EF50-55%, WM nl, GR1DD, septal mild paradox. e. echo 02/2015: EF 50-55%, wm   . Depression   . Heart attack ([place])   . Heel ulcer ([place]) 04/27/2015  . History of blood transfusion ~ 2011   "plasma; had neuropathy; couldn't walk"  . Hypertension   . LBBB (left bundle branch block)   . Neuromuscular disorder ([place])   . Neuropathy 2011  . Obesity, unspecified   . OSA on CPAP    Moderate with AHI 23/hr and now on CPAP at 16cm H2O.  Her DME is [facility]  . Pure hypercholesterolemia   . Sepsis ([place]) 09/2018  . Type II diabetes mellitus ([place])   . Unspecified vitamin D deficiency     Patient Active Problem List   Diagnosis Date Noted  . Cellulitis of left foot 11/25/2020  . Diabetic foot ulcer ([place]) 11/24/2020  . Advance care planning 10/04/2020  . Secondary hyperparathyroidism of renal origin ([place]) 07/20/2019  . Hyperkalemia 07/20/2019  . Benign hypertensive kidney disease with chronic kidney disease 07/20/2019  . Anemia in chronic kidney disease 07/20/2019  . Positive antinuclear antibody 07/20/2019  . Proteinuria 07/20/2019  . Acute on chronic systolic (congestive) heart failure ([place]) 03/23/2019  . UTI (urinary tract  infection) 03/09/2019  . CAD (coronary artery disease) 03/09/2019  . DM type 2, uncontrolled, with renal complications ([place]) 91/69/4503  . Atrial tachycardia ([place])   . Acute kidney injury superimposed on chronic kidney disease ([place])   . AKI (acute kidney injury) ([place])   . Weakness generalized 12/22/2018  . Shortness of breath 12/22/2018  . Palliative care encounter 12/22/2018  . Endocarditis 12/14/2018  . NSTEMI (non-ST elevated myocardial infarction) ([place]) 11/22/2018  . Acute on chronic combined systolic and diastolic CHF (congestive heart failure) ([place]) 10/28/2018  . Lymphedema 10/28/2018  . Severe sepsis ([place]) 09/28/2018   . History of amputation of lesser toe of right foot ([place]) 04/19/2018  . Presence of cardiac resynchronization therapy defibrillator (CRT-D) 04/19/2018  . Paraparesis of both lower limbs ([place]) 03/29/2018  . 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 Paroxysmal atrial fibrillation ([place]) 03/29/2018  . Class 3 obesity due to excess calories with serious comorbidity and body mass index (BMI) of 40.0 to 44.9 in adult 09/02/2016  . History of CVA (cerebrovascular accident) 11/11/2015  . Chronic renal insufficiency 08/04/2015  . OSA (obstructive sleep apnea) 08/15/2014  . Morbid obesity ([place]) 10/26/2013  . Essential hypertension, benign 04/29/2013  . Pure hypercholesterolemia 12/07/2012  . Iron deficiency anemia 12/07/2012  . Diabetic peripheral neuropathy associated with type 2 diabetes mellitus ([place]) 12/07/2012  . Chronic systolic congestive heart failure ([place]) 07/29/2012  . Left bundle-branch block 07/25/2012  . Coronary artery disease involving native coronary artery of native heart without angina pectoris 07/25/2012    Past Surgical History:  Procedure Laterality Date  . AMPUTATION TOE Right 06/18/2015   Procedure: AMPUTATION TOE;  Surgeon: Samara Deist, DPM;  Location: [facility] ORS;  Service: Podiatry;  Laterality: Right;  . AMPUTATION TOE Left 10/08/2018   Procedure: AMPUTATION TOE LEFT 2ND;  Surgeon: Albertine Patricia, DPM;  Location: [facility] ORS;  Service: Podiatry;  Laterality: Left;  . BACK SURGERY    . BI-VENTRICULAR IMPLANTABLE CARDIOVERTER DEFIBRILLATOR N/A 07/06/2014   Procedure: BI-VENTRICULAR IMPLANTABLE CARDIOVERTER DEFIBRILLATOR  (CRT-D);  Surgeon: Deboraha Sprang, MD;  Location: [facility] CATH LAB;  Service: Cardiovascular;  Laterality: N/A;  . BI-VENTRICULAR IMPLANTABLE CARDIOVERTER DEFIBRILLATOR  (CRT-D)  07/06/2014  . BILATERAL OOPHORECTOMY  01/2011   ovarian cyst benign  . BIV ICD INSERTION CRT-D N/A 03/31/2019   Procedure: BIV ICD INSERTION CRT-D;  Surgeon: Constance Haw, MD;   Location: [place] CV LAB;  Service: Cardiovascular;  Laterality: N/A;  . COLONOSCOPY WITH PROPOFOL Left 02/22/2015   Procedure: COLONOSCOPY WITH PROPOFOL;  Surgeon: Hulen Luster, MD;  Location[facility] ENDOSCOPY;  Service: Endoscopy;  Laterality: Left;  . CORONARY ANGIOPLASTY WITH STENT PLACEMENT Left 07/2012   new onset systolic CHF; elevated troponins.  Cardiac catheterization with stenting to LAD; EF 15%.  2D-echo: EF 20-25%.  . ESOPHAGOGASTRODUODENOSCOPY N/A 02/22/2015   Procedure: ESOPHAGOGASTRODUODENOSCOPY (EGD);  Surgeon: Hulen Luster, MD;  Location: [facility] ENDOSCOPY;  Service: Endoscopy;  Laterality: N/A;  . I & D EXTREMITY Left 11/28/2020   Procedure: IRRIGATION AND DEBRIDEMENT OF FOOT;  Surgeon: Trula Slade, DPM;  Location: [place];  Service: Podiatry;  Laterality: Left;  . INCISION AND DRAINAGE ABSCESS Right 2007   groin; with ICU stay due to sepsis.  Marland Kitchen LAPAROSCOPIC CHOLECYSTECTOMY  2011  . LEFT HEART CATH AND CORONARY ANGIOGRAPHY N/A 10/05/2018   Procedure: LEFT HEART CATH AND CORONARY ANGIOGRAPHY;  Surgeon: Minna Merritts, MD;  Location: [place] CV LAB;  Service: Cardiovascular;  Laterality: N/A;  . LEFT HEART CATHETERIZATION WITH  CORONARY ANGIOGRAM N/A 07/21/2012   Procedure: LEFT HEART CATHETERIZATION WITH CORONARY ANGIOGRAM;  Surgeon: Jolaine Artist, MD;  Location: [facility] CATH LAB;  Service: Cardiovascular;  Laterality: N/A;  . [place]   L4-5  . PACEMAKER REMOVAL  11/2018   due to infection around pacemaker  . PERCUTANEOUS CORONARY STENT INTERVENTION (PCI-S) N/A 07/23/2012   Procedure: PERCUTANEOUS CORONARY STENT INTERVENTION (PCI-S);  Surgeon: Sherren Mocha, MD;  Location: [facility] CATH LAB;  Service: Cardiovascular;  Laterality: N/A;  . PERIPHERAL VASCULAR BALLOON ANGIOPLASTY Left 10/06/2018   Procedure: PERIPHERAL VASCULAR BALLOON ANGIOPLASTY;  Surgeon: Katha Cabal, MD;  Location: [place] CV LAB;  Service: Cardiovascular;  Laterality: Left;  .  PERIPHERAL VASCULAR CATHETERIZATION N/A 02/10/2015   Procedure: Picc Line Insertion;  Surgeon: Katha Cabal, MD;  Location: [place] CV LAB;  Service: Cardiovascular;  Laterality: N/A;  . RIGHT HEART CATH N/A 03/29/2019   Procedure: RIGHT HEART CATH;  Surgeon: Jolaine Artist, MD;  Location: [place] CV LAB;  Service: Cardiovascular;  Laterality: N/A;  . TEE WITHOUT CARDIOVERSION N/A 10/02/2018   Procedure: TRANSESOPHAGEAL ECHOCARDIOGRAM (TEE);  Surgeon: Wellington Hampshire, MD;  Location: [facility] ORS;  Service: Cardiovascular;  Laterality: N/A;  . [place]  . VAGINAL HYSTERECTOMY  01/2011   Fibroids/DUB.  Ovaries removed. [place].     OB History   No obstetric history on file.     Family History  Problem Relation Age of Onset  . Diabetes Mother   . Hypertension Mother   . Arthritis Mother        knees, lumbar DDD, cervical DDD  . Cancer Father        prostate,skin,lymphoma.  . Cancer Brother 45       bladder cancer; non-smoker  . Diabetes Maternal Grandmother   . Heart disease Maternal Grandmother   . COPD Maternal Grandmother   . Diabetes Paternal Grandmother   . Obesity Brother   . Diabetes Son   . Hypertension Son   . Cancer Maternal Grandfather   . Diabetes Paternal Grandfather     Social History   Tobacco Use  . Smoking status: Never Smoker  . Smokeless tobacco: Never Used  Vaping Use  . Vaping Use: Never used  Substance Use Topics  . Alcohol use: No    Alcohol/week: 0.0 standard drinks  . Drug use: No    Home Medications Prior to Admission medications   Medication Sig Start Date End Date Taking? Authorizing Provider  amiodarone (PACERONE) 200 MG tablet Take 1 tablet (200 mg total) by mouth daily. 10/30/20   Bensimhon, Shaune Pascal, MD  calcitRIOL (ROCALTROL) 0.25 MCG capsule Take 0.25 mcg by mouth daily. Patient not taking: Reported on 01/26/2021 11/14/20   [provider]  calcitRIOL (ROCALTROL) 0.5 MCG capsule Take 0.5 mcg by  mouth daily. 12/25/20   [provider]  Calcium Carb-Cholecalciferol (CALCIUM CARBONATE-VITAMIN D3) 600-400 MG-UNIT TABS Take 1 tablet by mouth 2 (two) times a day.    [provider]  carvedilol (COREG) 3.125 MG tablet Take 3.125 mg by mouth daily.    [provider]  clopidogrel (PLAVIX) 75 MG tablet TAKE 1 TABLET BY MOUTH ONCE DAILY 11/10/20   Minna Merritts, MD  DULoxetine (CYMBALTA) 30 MG capsule Take 1 capsule (30 mg total) by mouth at bedtime. 01/26/21   Narda Amber K, DO  empagliflozin (JARDIANCE) 10 MG TABS tablet Take 1 tablet (10 mg total) by mouth daily before breakfast. 10/16/20  Bensimhon, Shaune Pascal, MD  ferrous sulfate 325 (65 FE) MG tablet Take 325 mg by mouth 2 (two) times daily with a meal.    [provider]  gabapentin (NEURONTIN) 600 MG tablet TAKE 1 TABLET BY IN THE MORNING AND 2 ATBEDTIME 10/02/20   [name], MD  gentamicin cream (GARAMYCIN) 0.1 % Apply 1 application topically 2 (two) times daily. 01/22/21   Edrick Kins, DPM  insulin aspart (NOVOLOG FLEXPEN) 100 UNIT/ML FlexPen Inject 5 Units into the skin 2 (two) times daily with a meal. 09/28/20   [name], MD  Insulin Pen Needle (PEN NEEDLES) 32G X 6 MM MISC 1 each by Does not apply route 4 (four) times daily. 09/06/19   Daleen Squibb, MD  isosorbide mononitrate (IMDUR) 30 MG 24 hr tablet Take 1 tablet (30 mg total) by mouth daily. Needs appt 05/16/20   Bensimhon, Shaune Pascal, MD  LANTUS SOLOSTAR 100 UNIT/ML Solostar Pen INJECT 22 UNITS UNDER THE SKIN DAILY. 10/02/20   [name], MD  levothyroxine (SYNTHROID) 50 MCG tablet TAKE 1 TABLET BY MOUTH ONCE DAILY BEFOREBREAKFAST 10/30/20   [name], MD  Multiple Vitamin (MULTIVITAMIN) tablet Take 1 tablet by mouth daily.    [provider]  nitroGLYCERIN (NITROSTAT) 0.4 MG SL tablet Place 1 tablet (0.4 mg total) under the tongue every 5 (five) minutes as needed for chest pain. 03/28/20 06/26/20  Deboraha Sprang, MD  Omega-3 Fatty Acids (FISH OIL) 1000 MG CAPS Take 1 capsule by mouth 2 (two) times a day.     [provider]  ONETOUCH ULTRA test strip UUD TO CHECK BLOOD SUGAR 3 TIMES DAILY 03/02/20   Jacelyn Pi, Lilia Argue, MD  rosuvastatin (CRESTOR) 10 MG tablet TAKE 1 TABLET BY MOUTH ONCE DAILY Patient not taking: Reported on 01/26/2021 11/15/20   [name], MD  torsemide (DEMADEX) 20 MG tablet Take 1 tablet (20 mg total) by mouth 2 (two) times daily. 11/30/20   Little Ishikawa, MD  traMADol (ULTRAM) 50 MG tablet Take 1-2 tablets (50-100 mg total) by mouth at bedtime as needed. 01/10/21   [name], MD    Allergies    Patient has no known allergies.  Review of Systems   Review of Systems  Skin: Positive for wound.  All other systems reviewed and are negative.   Physical Exam Updated Vital Signs BP (!) 136/58 (BP Location: Right Arm)   Pulse 64   Temp 98.4 F (36.9 C) (Oral)   Resp 16   Ht 5\' 8"  (1.727 m)   Wt 122.7 kg   SpO2 97%   BMI 41.13 kg/m   Physical Exam Vitals and nursing note reviewed.  Constitutional:      Appearance: Normal appearance.  HENT:     Head: Normocephalic.     Nose: Nose normal.     Mouth/Throat:     Mouth: Mucous membranes are moist.  Eyes:     Pupils: Pupils are equal, round, and reactive to light.  Cardiovascular:     Rate and Rhythm: Normal rate and regular rhythm.     Pulses: Normal pulses.     Heart sounds: Normal heart sounds.  Pulmonary:     Effort: Pulmonary effort is normal.     Breath sounds: Normal breath sounds.  Abdominal:     General: Abdomen is flat.     Palpations: Abdomen is soft.  Musculoskeletal:     Cervical back: Normal range  of motion and neck supple.     Comments: Patient has a ulcer underneath the left big toe.  The whole entire toe is erythematous.  There is erythema spreading to the midfoot.  There is some mild purulent drainage as well  Skin:    General: Skin is warm.     Capillary Refill:  Capillary refill takes less than 2 seconds.  Neurological:     General: No focal deficit present.     Mental Status: She is alert and oriented to person, place, and time.  Psychiatric:        Mood and Affect: Mood normal.        Behavior: Behavior normal.       ED Results / Procedures / Treatments   Labs (all labs ordered are listed, but only abnormal results are displayed) Labs Reviewed  CBC WITH DIFFERENTIAL/PLATELET - Abnormal; Notable for the following components:      Result Value   WBC 11.9 (*)    RBC 3.69 (*)    Hemoglobin 11.5 (*)    HCT 35.7 (*)    Neutro Abs 9.5 (*)    All other components within normal limits  BASIC METABOLIC PANEL - Abnormal; Notable for the following components:   Chloride 94 (*)    CO2 33 (*)    Glucose, Bld 331 (*)    BUN 55 (*)    Creatinine, Ser 2.87 (*)    GFR, Estimated 18 (*)    All other components within normal limits  CULTURE, BLOOD (ROUTINE X 2)  CULTURE, BLOOD (ROUTINE X 2)  LACTIC ACID, PLASMA  LACTIC ACID, PLASMA    EKG None  Radiology DG Toe Great Left  Result Date: 01/28/2021 CLINICAL DATA:  Infection to the great toe for 3 weeks. On antibiotics. EXAM: LEFT GREAT TOE COMPARISON:  01/13/2021 FINDINGS: Previous amputation of the second toe at the metatarsal phalangeal joint level. Soft tissue swelling at the base of the first toe laterally, extending into the space between the first and second toes. Soft tissue gas is present. Soft tissue swelling and gas or increasing since previous study, likely representing expanding abscess. No cortical erosion or bone sclerosis to suggest osteomyelitis. Degenerative changes in the intertarsal joints. IMPRESSION: 1. Soft tissue swelling and gas at the base of the first toe, increasing since prior study, likely representing expanding abscess. 2. No radiographic evidence of osteomyelitis. 3. Previous amputation of the second toe. Electronically Signed   By: Lucienne Capers M.D.   On:  01/28/2021 19:31    Procedures Procedures   Medications Ordered in ED Medications  vancomycin (VANCOREADY) IVPB 2000 mg/400 mL (2,000 mg Intravenous New Bag/Given 01/28/21 2100)  sodium chloride 0.9 % bolus 1,000 mL (1,000 mLs Intravenous New Bag/Given 01/28/21 2100)    ED Course  I have reviewed the triage vital signs and the nursing notes.  Pertinent labs & imaging results that were available during my care of the patient were reviewed by me and considered in my medical decision making (see chart for details).    MDM Rules/Calculators/A&P                         ANASOFIA MICALLEF is a 65 y.o. female who presenting with left big toe swelling and pain.  Patient is on second course of clindamycin already.  There was a concern for possible osteomyelitis and podiatry recommended outpatient MRI but has to be done at [facility] since she has a  pacemaker.  Plan to get CBC and BMP and left foot x-ray and consult podiatry  9:13 PM I talked to Dr. March Rummage from podiatry.  Her x-ray showed no osteomyelitis but possible abscess.  It is already draining clinically.  Her white blood cell count is 11.9.  He recommend IV antibiotics and admission.  He also recommend MRI of the foot.  Podiatry will see patient tomorrow.   Final Clinical Impression(s) / ED Diagnoses Final diagnoses:  None    Rx / DC Orders ED Discharge Orders    None       Drenda Freeze, MD 01/28/21 2114

## 2021-01-28 NOTE — ED Triage Notes (Addendum)
Pt reports L great toe infection 3 weeks ago.  Taking antibiotic and redness got better but still has "hole" in bottom of toe.  States her doctor ordered a MRI but she keeps getting calls about having it done somewhere else and she needs it done at [facility].  Woke up this morning and redness had returned.  Denies pain- has neuropathy.  Denies fever and chills.

## 2021-01-28 NOTE — ED Triage Notes (Signed)
Emergency Medicine Provider Triage Evaluation Note  [name] , a 65 y.o. female  was evaluated in triage.  Pt complains of left great toe pain and erythema. Currently on an antibiotic for toe infection which improved the erythema; however, erythema returned today. She called the on call podiatry doctor who recommended reporting to the ED for labs and images. No fever or chills. She has been compliant with the antibiotics  Review of Systems  Positive: Color change Negative: fever  Physical Exam  BP (!) 133/56 (BP Location: Left Arm)   Pulse 69   Temp 98.4 F (36.9 C) (Oral)   Resp 18   SpO2 100%  Gen:   Awake, no distress   HEENT:  Atraumatic  Resp:  Normal effort  Cardiac:  Normal rate  Abd:   Nondistended, nontender  MSK:   Left food bandaged and in post-operative shoe Neuro:  Speech clear   Medical Decision Making  Medically screening exam initiated at 6:45 PM.  Appropriate orders placed.  [name] was informed that the remainder of the evaluation will be completed by another provider, this initial triage assessment does not replace that evaluation, and the importance of remaining in the ED until their evaluation is complete.  Clinical Impression  Wound infection. X-ray ordered and routine labs. Patient not clinically septic at this time.    Suzy Bouchard, [place] 01/28/21 1901

## 2021-01-29 ENCOUNTER — Inpatient Hospital Stay (HOSPITAL_COMMUNITY): Payer: HMO

## 2021-01-29 DIAGNOSIS — L0390 Cellulitis, unspecified: Secondary | ICD-10-CM

## 2021-01-29 DIAGNOSIS — L97522 Non-pressure chronic ulcer of other part of left foot with fat layer exposed: Secondary | ICD-10-CM | POA: Diagnosis not present

## 2021-01-29 DIAGNOSIS — I739 Peripheral vascular disease, unspecified: Secondary | ICD-10-CM | POA: Diagnosis not present

## 2021-01-29 LAB — BASIC METABOLIC PANEL
Anion gap: 8 (ref 5–15)
BUN: 49 mg/dL — ABNORMAL HIGH (ref 8–23)
CO2: 30 mmol/L (ref 22–32)
Calcium: 8.6 mg/dL — ABNORMAL LOW (ref 8.9–10.3)
Chloride: 101 mmol/L (ref 98–111)
Creatinine, Ser: 2.48 mg/dL — ABNORMAL HIGH (ref 0.44–1.00)
GFR, Estimated: 21 mL/min — ABNORMAL LOW (ref >60)
Glucose, Bld: 141 mg/dL — ABNORMAL HIGH (ref 70–99)
Potassium: 3.9 mmol/L (ref 3.5–5.1)
Sodium: 139 mmol/L (ref 135–145)

## 2021-01-29 LAB — CBC
HCT: 32.6 % — ABNORMAL LOW (ref 36.0–46.0)
Hemoglobin: 10.6 g/dL — ABNORMAL LOW (ref 12.0–15.0)
MCH: 31.4 pg (ref 26.0–34.0)
MCHC: 32.5 g/dL (ref 30.0–36.0)
MCV: 96.4 fL (ref 80.0–100.0)
Platelets: 160 10*3/uL (ref 150–400)
RBC: 3.38 MIL/uL — ABNORMAL LOW (ref 3.87–5.11)
RDW: 14.1 % (ref 11.5–15.5)
WBC: 10.1 10*3/uL (ref 4.0–10.5)
nRBC: 0 % (ref 0.0–0.2)

## 2021-01-29 LAB — GLUCOSE, CAPILLARY
Glucose-Capillary: 146 mg/dL — ABNORMAL HIGH (ref 70–99)
Glucose-Capillary: 142 mg/dL — ABNORMAL HIGH (ref 70–99)
Glucose-Capillary: 197 mg/dL — ABNORMAL HIGH (ref 70–99)
Glucose-Capillary: 186 mg/dL — ABNORMAL HIGH (ref 70–99)

## 2021-01-29 MED ORDER — FERROUS SULFATE 325 (65 FE) MG PO TABS
325.0000 mg | ORAL_TABLET | Freq: Every day | ORAL | Status: DC
  Administered 2021-01-29 – 2021-02-06 (×8): 325 mg via ORAL
  Filled 2021-01-29 (×8): qty 1

## 2021-01-29 NOTE — Progress Notes (Signed)
Progress Note    ADRIAN SPECHT  JOA:[id number] DOB: 1956/04/22  DOA: 01/28/2021 PCP: [name], MD    Brief Narrative:     Medical records reviewed and are as summarized below:  [name] is an 65 y.o. female with medical history significant for DMT2 with neuropathy, CKD 4, HFrEF, CAD, has AICD, diabetic foot ulcer on left, HTN, HLD, morbid obesity who presents for evaluation of worsening infection in left foot.  Seen a month ago in the emergency room and placed on clindamycin for diabetic foot ulcer with cellulitis at that time.  She is followed up with podiatry as an outpatient and continued on the clindamycin for the last month.  Despite the antibiotic she has worsening swelling and redness of her left first toe and midfoot.   Assessment/Plan:   Principal Problem:   Cellulitis of left foot Active Problems:   Chronic systolic congestive heart failure (HCC)   Diabetic peripheral neuropathy associated with type 2 diabetes mellitus (HCC)   Essential hypertension, benign   DM type 2, uncontrolled, with renal complications (HCC)   Diabetic foot ulcer (HCC)   CKD (chronic kidney disease) stage 4, GFR 15-29 ml/min (HCC)   AICD (automatic cardioverter/defibrillator) present   Cellulitis of left foot - vancomycin and cefepime for empiric antibiotic coverage.  Patient has been on clindamycin for the last month for infection but has worsened over that time. Pain control provided as needed. Podiatry has been consulted by the emergency room physician and will see patient in the morning. - concern for osteomyelitis: MRI pending -X-ray reveals soft tissue swelling and gas at the base of the first toe of left foot.  Possible abscess- MRI Pending (pacemaker is compatible as has had MRI in past)   Diabetic peripheral neuropathy associated with type 2 diabetes mellitus  Chronic.    Essential hypertension, benign Continue Coreg and Imdur.    DM type 2, uncontrolled,  with renal complications  Continue basal insulin with lantus. Continue Jardiance. Monitor blood sugar with meals and at bedtime. SSI provided as needed for glycemic control.     Diabetic foot ulcer  Chronic and has not improved with outpatient therapy.  -ABI penidng    CKD (chronic kidney disease) stage 4, GFR 15-29 ml/min  Stable. Avoid nephrotoxic medications.     Chronic systolic congestive heart failure  Stable.   obesity Body mass index is 41.13 kg/m.   Family Communication/Anticipated D/C date and plan/Code Status   DVT prophylaxis: heparin Code Status: Full Code.  Disposition Plan: Status is: Inpatient  Remains inpatient appropriate because:Inpatient level of care appropriate due to severity of illness   Dispo: The patient is from: Home              Anticipated d/c is to: Home              Patient currently is not medically stable to d/c.   Difficult to place patient No         Medical Consultants:    Podiatry     Subjective:   In bed, no SOB, noCP  Objective:    Vitals:   01/28/21 2239 01/28/21 2300 01/29/21 0627 01/29/21 1203  BP: (!) 148/60 (!) 128/48 (!) 124/47 (!) 136/44  Pulse: 71 61 (!) 57 (!) 57  Resp: 15 18 18 16   Temp:  98.5 F (36.9 C) 98.3 F (36.8 C) 97.9 F (36.6 C)  TempSrc:  Oral Oral Oral  SpO2: 99% 99% 100%  100%  Weight:      Height:        Intake/Output Summary (Last 24 hours) at 01/29/2021 1303 Last data filed at 01/29/2021 1200 Gross per 24 hour  Intake 396.67 ml  Output --  Net 396.67 ml   Filed Weights   01/28/21 1941  Weight: 122.7 kg    Exam:  General: Appearance:    Severely obese female in no acute distress     Lungs:     respirations unlabored  Heart:    Bradycardic.      Neurologic:   Awake, alert, oriented x 3. No apparent focal neurological           defect.     Data Reviewed:   I have personally reviewed following labs and imaging studies:  Labs: Labs show the following:   Basic  Metabolic Panel: Recent Labs  Lab 01/24/21 0953 01/28/21 1935 01/28/21 2310 01/29/21 0624  NA 142 135  --  139  K 4.6 4.3  --  3.9  CL 96 94*  --  101  CO2 35* 33*  --  30  GLUCOSE 104* 331*  --  141*  BUN 48* 55*  --  49*  CREATININE 3.02* 2.87* 2.75* 2.48*  CALCIUM 9.7 8.9  --  8.6*   GFR Estimated Creatinine Clearance: 30.8 mL/min (A) (by C-G formula based on SCr of 2.48 mg/dL (H)). Liver Function Tests: No results for input(s): AST, ALT, ALKPHOS, BILITOT, PROT, ALBUMIN in the last 168 hours. No results for input(s): LIPASE, AMYLASE in the last 168 hours. No results for input(s): AMMONIA in the last 168 hours. Coagulation profile No results for input(s): INR, PROTIME in the last 168 hours.  CBC: Recent Labs  Lab 01/28/21 1935 01/28/21 2310 01/29/21 0624  WBC 11.9* 11.4* 10.1  NEUTROABS 9.5*  --   --   HGB 11.5* 10.7* 10.6*  HCT 35.7* 33.3* 32.6*  MCV 96.7 95.7 96.4  PLT 179 166 160   Cardiac Enzymes: No results for input(s): CKTOTAL, CKMB, CKMBINDEX, TROPONINI in the last 168 hours. BNP (last 3 results) No results for input(s): PROBNP in the last 8760 hours. CBG: Recent Labs  Lab 01/28/21 2320 01/29/21 0616 01/29/21 1135  GLUCAP 269* 146* 142*   D-Dimer: No results for input(s): DDIMER in the last 72 hours. Hgb A1c: No results for input(s): HGBA1C in the last 72 hours. Lipid Profile: No results for input(s): CHOL, HDL, LDLCALC, TRIG, CHOLHDL, LDLDIRECT in the last 72 hours. Thyroid function studies: No results for input(s): TSH, T4TOTAL, T3FREE, THYROIDAB in the last 72 hours.  Invalid input(s): FREET3 Anemia work up: No results for input(s): VITAMINB12, FOLATE, FERRITIN, TIBC, IRON, RETICCTPCT in the last 72 hours. Sepsis Labs: Recent Labs  Lab 01/28/21 1935 01/28/21 2007 01/28/21 2310 01/29/21 0624  WBC 11.9*  --  11.4* 10.1  LATICACIDVEN  --  1.5 1.2  --     Microbiology Recent Results (from the past 240 hour(s))  Blood culture (routine  x 2)     Status: None (Preliminary result)   Collection Time: 01/28/21  8:41 PM   Specimen: BLOOD RIGHT WRIST  Result Value Ref Range Status   Specimen Description BLOOD RIGHT WRIST  Final   Special Requests   Final    BOTTLES DRAWN AEROBIC AND ANAEROBIC Blood Culture adequate volume   Culture   Final    NO GROWTH < 12 HOURS Performed at [facility], [place] [address] [place], [place] [address]  Report Status PENDING  Incomplete  Blood culture (routine x 2)     Status: None (Preliminary result)   Collection Time: 01/28/21  8:55 PM   Specimen: BLOOD LEFT ARM  Result Value Ref Range Status   Specimen Description BLOOD LEFT ARM  Final   Special Requests   Final    BOTTLES DRAWN AEROBIC AND ANAEROBIC Blood Culture adequate volume   Culture   Final    NO GROWTH < 12 HOURS Performed at [facility], [place] [address], [place], [place] [address]    Report Status PENDING  Incomplete  Resp Panel by RT-PCR (Flu A&B, Covid) Nasopharyngeal Swab     Status: None   Collection Time: 01/28/21  9:39 PM   Specimen: Nasopharyngeal Swab; Nasopharyngeal(NP) swabs in vial transport medium  Result Value Ref Range Status   SARS Coronavirus 2 by RT PCR NEGATIVE NEGATIVE Final    Comment: (NOTE) SARS-CoV-2 target nucleic acids are NOT DETECTED.  The SARS-CoV-2 RNA is generally detectable in upper respiratory specimens during the acute phase of infection. The lowest concentration of SARS-CoV-2 viral copies this assay can detect is 138 copies/mL. A negative result does not preclude SARS-Cov-2 infection and should not be used as the sole basis for treatment or other patient management decisions. A negative result may occur with  improper specimen collection/handling, submission of specimen other than nasopharyngeal swab, presence of viral mutation(s) within the areas targeted by this assay, and inadequate number of viral copies(<138 copies/mL). A negative result must be combined with clinical  observations, patient history, and epidemiological information. The expected result is Negative.  Fact Sheet for Patients:  EntrepreneurPulse.com.au  Fact Sheet for Healthcare Providers:  IncredibleEmployment.be  This test is no t yet approved or cleared by the [place] FDA and  has been authorized for detection and/or diagnosis of SARS-CoV-2 by FDA under an Emergency Use Authorization (EUA). This EUA will remain  in effect (meaning this test can be used) for the duration of the COVID-19 declaration under Section 564(b)(1) of the Act, 21 U.S.C.section 360bbb-3(b)(1), unless the authorization is terminated  or revoked sooner.       Influenza A by PCR NEGATIVE NEGATIVE Final   Influenza B by PCR NEGATIVE NEGATIVE Final    Comment: (NOTE) The Xpert Xpress SARS-CoV-2/FLU/RSV plus assay is intended as an aid in the diagnosis of influenza from Nasopharyngeal swab specimens and should not be used as a sole basis for treatment. Nasal washings and aspirates are unacceptable for Xpert Xpress SARS-CoV-2/FLU/RSV testing.  Fact Sheet for Patients: EntrepreneurPulse.com.au  Fact Sheet for Healthcare Providers: IncredibleEmployment.be  This test is not yet approved or cleared by the [place] FDA and has been authorized for detection and/or diagnosis of SARS-CoV-2 by FDA under an Emergency Use Authorization (EUA). This EUA will remain in effect (meaning this test can be used) for the duration of the COVID-19 declaration under Section 564(b)(1) of the Act, 21 U.S.C. section 360bbb-3(b)(1), unless the authorization is terminated or revoked.  Performed at [facility], [place] [address], [place], [place] [address]     Procedures and diagnostic studies:  DG Toe Great Left  Result Date: 01/28/2021 CLINICAL DATA:  Infection to the great toe for 3 weeks. On antibiotics. EXAM: LEFT GREAT TOE COMPARISON:   01/13/2021 FINDINGS: Previous amputation of the second toe at the metatarsal phalangeal joint level. Soft tissue swelling at the base of the first toe laterally, extending into the space between the first and second toes. Soft  tissue gas is present. Soft tissue swelling and gas or increasing since previous study, likely representing expanding abscess. No cortical erosion or bone sclerosis to suggest osteomyelitis. Degenerative changes in the intertarsal joints. IMPRESSION: 1. Soft tissue swelling and gas at the base of the first toe, increasing since prior study, likely representing expanding abscess. 2. No radiographic evidence of osteomyelitis. 3. Previous amputation of the second toe. Electronically Signed   By: Lucienne Capers M.D.   On: 01/28/2021 19:31   VAS [place] ABI WITH/WO TBI  Result Date: 01/29/2021  LOWER EXTREMITY DOPPLER STUDY Patient Name:  ALONDA WEABER  Date of Exam:   01/29/2021 Medical Rec #: [id number]          Accession #:    [id number] Date of Birth: 28-Aug-1956           Patient Gender: F Patient Age:   64Y Exam Location:  [facility] Procedure:      VAS [place] ABI WITH/WO TBI Referring Phys: [id number] KEVIN P PATEL --------------------------------------------------------------------------------  Indications: Ulceration, and peripheral artery disease. High Risk Factors: Hypertension, Diabetes, prior MI, coronary artery disease,                    prior CVA.  Comparison Study: 10-06-2018 Peripheral vascular balloon angioplasty showed                   heavy disease of distal popliteal artery/trifurcation with                   >90% stenosis of the ATA, diffuse disease of the peroneal, and                   widely patent PTA. Documented successful recanalization. Performing Technologist: Darlin Coco RDMS RVT  Examination Guidelines: A complete evaluation includes at minimum, Doppler waveform signals and systolic blood pressure reading at the level of bilateral brachial, anterior tibial,  and posterior tibial arteries, when vessel segments are accessible. Bilateral testing is considered an integral part of a complete examination. Photoelectric Plethysmograph (PPG) waveforms and toe systolic pressure readings are included as required and additional duplex testing as needed. Limited examinations for reoccurring indications may be performed as noted.  ABI Findings: +---------+------------------+-----+---------+--------+ Right    Rt Pressure (mmHg)IndexWaveform Comment  +---------+------------------+-----+---------+--------+ Brachial 144                    triphasic         +---------+------------------+-----+---------+--------+ PTA      177               1.23 triphasic         +---------+------------------+-----+---------+--------+ DP       149               1.03 triphasic         +---------+------------------+-----+---------+--------+ Great Toe98                0.68                   +---------+------------------+-----+---------+--------+ +---------+------------------+-----+----------+-------+ Left     Lt Pressure (mmHg)IndexWaveform  Comment +---------+------------------+-----+----------+-------+ Brachial 138                    triphasic         +---------+------------------+-----+----------+-------+ PTA      123               0.85 monophasic        +---------+------------------+-----+----------+-------+  DP       73                0.51 monophasic        +---------+------------------+-----+----------+-------+ Great Toe20                0.14 Abnormal          +---------+------------------+-----+----------+-------+ +-------+-----------+-----------+------------+------------+ ABI/TBIToday's ABIToday's TBIPrevious ABIPrevious TBI +-------+-----------+-----------+------------+------------+ Right  1.23       0.68                                +-------+-----------+-----------+------------+------------+ Left   0.85       0.14                                 +-------+-----------+-----------+------------+------------+  Summary: Right: Resting right ankle-brachial index is within normal range. No evidence of significant right lower extremity arterial disease. Left: Resting left ankle-brachial index indicates mild left lower extremity arterial disease. The left toe-brachial index is abnormal. LT Great toe pressure = 20 mmHg.  *See table(s) above for measurements and observations.     Preliminary     Medications:   . amiodarone  200 mg Oral q morning  . carvedilol  3.125 mg Oral Daily  . clopidogrel  75 mg Oral Daily  . DULoxetine  30 mg Oral QHS  . empagliflozin  10 mg Oral QAC breakfast  . ferrous sulfate  325 mg Oral q morning  . gabapentin  300 mg Oral BID  . heparin  5,000 Units Subcutaneous Q8H  . insulin aspart  0-20 Units Subcutaneous TID WC  . insulin aspart  0-5 Units Subcutaneous QHS  . insulin glargine  22 Units Subcutaneous QHS  . isosorbide mononitrate  30 mg Oral Daily  . levothyroxine  50 mcg Oral Q0600  . losartan  12.5 mg Oral q morning  . torsemide  20 mg Oral BID   Continuous Infusions: . ceFEPime (MAXIPIME) IV 2 g (01/28/21 2353)  . lactated ringers 50 mL/hr at 01/28/21 2328  . [START ON 01/30/2021] vancomycin       LOS: 1 day   Geradine Girt  [facility]   How to contact the [facility] Attending or Consulting provider [place] or covering provider during after hours [place], for this patient?  1. Check the care team in [facility] and look for a) attending/consulting [facility] provider listed and b) the [facility] team listed 2. Log into [email or web address] and use [facility]'s universal password to access. If you do not have the password, please contact the hospital operator. 3. Locate the [facility] provider you are looking for under [facility] and page to a number that you can be directly reached. 4. If you still have difficulty reaching the provider, please page the [facility] (Director on Call) for the Hospitalists listed  on amion for assistance.  01/29/2021, 1:03 PM

## 2021-01-29 NOTE — Consult Note (Signed)
  Subjective:  Patient ID: [name], female    DOB: 1956/04/04,  MRN: [id number]  A 65 y.o. female with medical history significant for DMT2 with neuropathy, CKD 4, HFrEF, CAD, has AICD, diabetic foot ulcer on left, HTN, HLD, morbid obesity who presents for left foot abscess with cellulitis.  Patient states it started going on for past few months.  She was put on antibiotics as an outpatient by Dr. Amalia Hailey.  She states that overall she is better.  She has not had any fevers.  Her sugars have been also little bit uncontrolled.  Her last A1c was at 8.1.  She denies any other acute complaints. Objective:   Vitals:   01/28/21 2300 01/29/21 0627  BP: (!) 128/48 (!) 124/47  Pulse: 61 (!) 57  Resp: 18 18  Temp: 98.5 F (36.9 C) 98.3 F (36.8 C)  SpO2: 99% 100%   General AA&O x3. Normal mood and affect.  Vascular Dorsalis pedis and posterior tibial pulses nonpalpable Brisk capillary refill to all digits.  No pedal hair present.  Neurologic Epicritic sensation grossly intact.  Dermatologic  left submetatarsal 1 ulcer with cellulitis up to the circumferential around the first metatarsophalangeal joint and the digit.  Purulent drainage was expressed.  Using hemostat, the soft tissue was thoroughly decompressed.  No further purulent drainage noted.  No malodor present.  Does probe down to bone. Nails well groomed and normal in appearance.  Orthopedic: MMT 5/5 in dorsiflexion, plantarflexion, inversion, and eversion. Normal joint ROM without pain or crepitus.    IMPRESSION: 1. Soft tissue swelling and gas at the base of the first toe, increasing since prior study, likely representing expanding abscess. 2. No radiographic evidence of osteomyelitis. 3. Previous amputation of the second toe. Assessment & Plan:  Patient was evaluated and treated and all questions answered.  Left submetatarsal 1 ulceration with cellulitis -All questions and concerns were addressed at bedside. -Bedside  decompression was performed to express all the purulent drainage using hemostat to decompress all the plane.  Approximately 3 cc of purulent drainage was expressed.  No further noted at the end of it. -Continue IV antibiotics for cellulitis -I will plan on taking the patient to the operating room for formal incision and drainage washout debridement with bone biopsy versus partial first ray amputation. -I will attempt to order an MRI for this patient prior to the surgery. -She will also benefit from ABI/PVR to assess the vascular flow and a possible vascular evaluation and management as well. -Partial weightbearing to the heel to the left lower extremity with a Darco wedge shoe. -Soft tissue air as seen on radiograph correlates with the wound does not extend past it.  This has also been decompressed through my bedside decompression. -At this time I discussed with the patient extensive due to there is a high risk of losing the digit versus the foot.  Patient states understanding.  Felipa Furnace, DPM  Accessible via secure chat for questions or concerns.

## 2021-01-29 NOTE — Progress Notes (Signed)
ABI w/ TBI study completed.   Please see CV Proc for preliminary results.   Blaike Vickers, RDMS, RVT  

## 2021-01-29 NOTE — Plan of Care (Signed)

## 2021-01-30 ENCOUNTER — Inpatient Hospital Stay (HOSPITAL_COMMUNITY): Payer: HMO

## 2021-01-30 DIAGNOSIS — I739 Peripheral vascular disease, unspecified: Secondary | ICD-10-CM

## 2021-01-30 DIAGNOSIS — L03116 Cellulitis of left lower limb: Secondary | ICD-10-CM

## 2021-01-30 LAB — BASIC METABOLIC PANEL
Anion gap: 11 (ref 5–15)
BUN: 44 mg/dL — ABNORMAL HIGH (ref 8–23)
CO2: 28 mmol/L (ref 22–32)
Calcium: 8.8 mg/dL — ABNORMAL LOW (ref 8.9–10.3)
Chloride: 98 mmol/L (ref 98–111)
Creatinine, Ser: 2.21 mg/dL — ABNORMAL HIGH (ref 0.44–1.00)
GFR, Estimated: 24 mL/min — ABNORMAL LOW (ref >60)
Glucose, Bld: 158 mg/dL — ABNORMAL HIGH (ref 70–99)
Potassium: 4.1 mmol/L (ref 3.5–5.1)
Sodium: 137 mmol/L (ref 135–145)

## 2021-01-30 LAB — CBC
HCT: 31.5 % — ABNORMAL LOW (ref 36.0–46.0)
Hemoglobin: 10.1 g/dL — ABNORMAL LOW (ref 12.0–15.0)
MCH: 30.9 pg (ref 26.0–34.0)
MCHC: 32.1 g/dL (ref 30.0–36.0)
MCV: 96.3 fL (ref 80.0–100.0)
Platelets: 161 10*3/uL (ref 150–400)
RBC: 3.27 MIL/uL — ABNORMAL LOW (ref 3.87–5.11)
RDW: 14.1 % (ref 11.5–15.5)
WBC: 7.9 10*3/uL (ref 4.0–10.5)
nRBC: 0 % (ref 0.0–0.2)

## 2021-01-30 LAB — GLUCOSE, CAPILLARY
Glucose-Capillary: 144 mg/dL — ABNORMAL HIGH (ref 70–99)
Glucose-Capillary: 152 mg/dL — ABNORMAL HIGH (ref 70–99)
Glucose-Capillary: 216 mg/dL — ABNORMAL HIGH (ref 70–99)
Glucose-Capillary: 176 mg/dL — ABNORMAL HIGH (ref 70–99)

## 2021-01-30 LAB — HEMOGLOBIN A1C
Hgb A1c MFr Bld: 8.1 % — ABNORMAL HIGH (ref 4.8–5.6)
Mean Plasma Glucose: 186 mg/dL

## 2021-01-30 MED ORDER — ATORVASTATIN CALCIUM 10 MG PO TABS
10.0000 mg | ORAL_TABLET | Freq: Every day | ORAL | Status: DC
  Administered 2021-01-30 – 2021-02-06 (×6): 10 mg via ORAL
  Filled 2021-01-30 (×6): qty 1

## 2021-01-30 MED ORDER — ASPIRIN EC 81 MG PO TBEC
81.0000 mg | DELAYED_RELEASE_TABLET | Freq: Every day | ORAL | Status: DC
  Administered 2021-01-30 – 2021-02-06 (×7): 81 mg via ORAL
  Filled 2021-01-30 (×7): qty 1

## 2021-01-30 NOTE — Consult Note (Addendum)
[facility] [place] CONSULT NOTE  PERIPHERAL VASCULAR DISEASE WITH LEFT FOOT WOUND: This patient has a nonhealing wound in the left foot with evidence of severe infrainguinal arterial occlusive disease.  She also has CKD 4.  I have recommended we proceed with CO2 arteriography on Friday. I have reviewed with the patient the indications for arteriography. In addition, I have reviewed the potential complications of arteriography including but not limited to: Bleeding, arterial injury, arterial thrombosis, dye action, renal insufficiency, or other unpredictable medical problems. I have explained to the patient that if we find disease amenable to angioplasty we could potentially address this at the same time. I have discussed the potential complications of angioplasty and stenting, including but not limited to: Bleeding, arterial thrombosis, arterial injury, dissection, or the need for surgical intervention.  Fortunately she is not a smoker.  I have written for 81 mg of aspirin daily and a low-dose statin.  01/30/2021 5:31 PM  Deitra Mayo, MD    Reason for Consult: Non healing left foot wound Referring Physician: Dr. Boneta Lucks  History of Present Illness: 65 y/o female with left non healing foot wound.  Past medical history includes: DMT2 with neuropathy, CKD 4, HFrEF, CAD, has AICD, diabetic foot ulcer on left, HTN, HLD.    Her left foot wound started in January.  She was followed by Podiatry in [place], then [facility].   She was seen in the ED a month ago with left foot infection/cellulitis and placed on Clindamycin.  Her symptoms progressed with increased edema and cellulitis.  She had ABI's on 01/30/21 which demonstrates Left ABI 0.85 monophasic and TBI 0.14.  She denise issues on the right LE and her ABI is 1.23 with triphasic flow.    She denise fever and chills.     Current Facility-Administered Medications  Medication Dose Route Frequency Provider  Last Rate Last Admin  . acetaminophen (TYLENOL) tablet 650 mg  650 mg Oral Q6H PRN Chotiner, Yevonne Aline, MD       Or  . acetaminophen (TYLENOL) suppository 650 mg  650 mg Rectal Q6H PRN Chotiner, Yevonne Aline, MD      . amiodarone (PACERONE) tablet 200 mg  200 mg Oral q morning Chotiner, Yevonne Aline, MD   200 mg at 01/30/21 0831  . carvedilol (COREG) tablet 3.125 mg  3.125 mg Oral Daily Chotiner, Yevonne Aline, MD   3.125 mg at 01/30/21 0826  . ceFEPIme (MAXIPIME) 2 g in sodium chloride 0.9 % 100 mL IVPB  2 g Intravenous Q24H Chotiner, Yevonne Aline, MD 200 mL/hr at 01/29/21 2124 2 g at 01/29/21 2124  . clopidogrel (PLAVIX) tablet 75 mg  75 mg Oral Daily Chotiner, Yevonne Aline, MD   75 mg at 01/30/21 0826  . DULoxetine (CYMBALTA) DR capsule 30 mg  30 mg Oral QHS Chotiner, Yevonne Aline, MD   30 mg at 01/29/21 2127  . empagliflozin (JARDIANCE) tablet 10 mg  10 mg Oral QAC breakfast Chotiner, Yevonne Aline, MD   10 mg at 01/30/21 0826  . ferrous sulfate tablet 325 mg  325 mg Oral Q breakfast Eulogio Bear U, DO   325 mg at 01/30/21 1610  . gabapentin (NEURONTIN) capsule 300 mg  300 mg Oral BID Chotiner, Yevonne Aline, MD   300 mg at 01/30/21 0826  . heparin injection 5,000 Units  5,000 Units Subcutaneous Q8H Chotiner, Yevonne Aline, MD   5,000 Units at 01/30/21 1246  . insulin aspart (novoLOG) injection 0-20  Units  0-20 Units Subcutaneous TID WC Chotiner, Yevonne Aline, MD   4 Units at 01/30/21 1247  . insulin aspart (novoLOG) injection 0-5 Units  0-5 Units Subcutaneous QHS Chotiner, Yevonne Aline, MD   3 Units at 01/28/21 2339  . insulin glargine (LANTUS) injection 22 Units  22 Units Subcutaneous QHS Chotiner, Yevonne Aline, MD   22 Units at 01/29/21 2127  . isosorbide mononitrate (IMDUR) 24 hr tablet 30 mg  30 mg Oral Daily Chotiner, Yevonne Aline, MD   30 mg at 01/30/21 0826  . levothyroxine (SYNTHROID) tablet 50 mcg  50 mcg Oral Q0600 Chotiner, Yevonne Aline, MD   50 mcg at 01/30/21 0602  . losartan (COZAAR) tablet 12.5 mg  12.5 mg Oral q morning  Chotiner, Yevonne Aline, MD   12.5 mg at 01/30/21 0831  . ondansetron (ZOFRAN) tablet 4 mg  4 mg Oral Q6H PRN Chotiner, Yevonne Aline, MD       Or  . ondansetron (ZOFRAN) injection 4 mg  4 mg Intravenous Q6H PRN Chotiner, Yevonne Aline, MD      . senna-docusate (Senokot-S) tablet 1 tablet  1 tablet Oral QHS PRN Chotiner, Yevonne Aline, MD      . torsemide (DEMADEX) tablet 20 mg  20 mg Oral BID Chotiner, Yevonne Aline, MD   20 mg at 01/30/21 0826  . traMADol (ULTRAM) tablet 50 mg  50 mg Oral Q6H PRN Chotiner, Yevonne Aline, MD   50 mg at 01/29/21 2127  . vancomycin (VANCOREADY) IVPB 1500 mg/300 mL  1,500 mg Intravenous Q48H Chotiner, Yevonne Aline, MD        Pt meds include: Statin :No Betablocker: Yes ASA: No Other anticoagulants/antiplatelets: Plavix  Past Medical History:  Diagnosis Date  . Acute myocardial infarction, subendocardial infarction, initial episode of care ([place]) 07/21/2012  . Acute osteomyelitis involving ankle and foot ([place]) 02/06/2015  . Acute systolic heart failure ([place]) 07/21/2012   New onset 07/19/12; admission to [facility] ED. Elevated Troponins.  S/p 2D-echo with EF 20-25%.  S/p cardiac catheterization with stenting LAD.  Repeat 2D-echo 10/2011 with improved EF of 35%.   . Anemia   . Automatic implantable cardioverter-defibrillator in situ    a. MDT CRT-D 06/2014, SN: [id number]  .removed in feb 2020  . CAD (coronary artery disease)    a. cardiac cath 101/04/2012: PCI/DES to chronically occluded mLAD, consideration PCI to diag branch in 4 weeks.   . Cataract   . Chicken pox   . Chronic kidney disease   . Chronic systolic CHF (congestive heart failure) (HCC)    a. mixed ICM & NICM; b. EF 20-25% by echo 07/2012, mid-dist 2/3 of LV sev HK/AK, mild MR. echo 10/2012: EF 30-35%, sev HK ant-septal & inf walls, GR1DD, mild MR, PASP 33. c. echo 02/2013: EF 30%, GR1DD, mild MR. echo 04/2014: EF 30%, Septal-lat dyssynchrony, global HK, inf AK, GR1DD, mild MR. d. echo 10/2014: EF50-55%, WM nl, GR1DD, septal  mild paradox. e. echo 02/2015: EF 50-55%, wm   . Depression   . Heart attack ([place])   . Heel ulcer ([place]) 04/27/2015  . History of blood transfusion ~ 2011   "plasma; had neuropathy; couldn't walk"  . Hypertension   . LBBB (left bundle branch block)   . Neuromuscular disorder ([place])   . Neuropathy 2011  . Obesity, unspecified   . OSA on CPAP    Moderate with AHI 23/hr and now on CPAP at 16cm H2O.  Her DME is [facility]  . Pure hypercholesterolemia   .  Sepsis ([place]) 09/2018  . Type II diabetes mellitus ([place])   . Unspecified vitamin D deficiency     Past Surgical History:  Procedure Laterality Date  . AMPUTATION TOE Right 06/18/2015   Procedure: AMPUTATION TOE;  Surgeon: Samara Deist, DPM;  Location: [facility] ORS;  Service: Podiatry;  Laterality: Right;  . AMPUTATION TOE Left 10/08/2018   Procedure: AMPUTATION TOE LEFT 2ND;  Surgeon: Albertine Patricia, DPM;  Location: [facility] ORS;  Service: Podiatry;  Laterality: Left;  . BACK SURGERY    . BI-VENTRICULAR IMPLANTABLE CARDIOVERTER DEFIBRILLATOR N/A 07/06/2014   Procedure: BI-VENTRICULAR IMPLANTABLE CARDIOVERTER DEFIBRILLATOR  (CRT-D);  Surgeon: Deboraha Sprang, MD;  Location: [facility] CATH LAB;  Service: Cardiovascular;  Laterality: N/A;  . BI-VENTRICULAR IMPLANTABLE CARDIOVERTER DEFIBRILLATOR  (CRT-D)  07/06/2014  . BILATERAL OOPHORECTOMY  01/2011   ovarian cyst benign  . BIV ICD INSERTION CRT-D N/A 03/31/2019   Procedure: BIV ICD INSERTION CRT-D;  Surgeon: Constance Haw, MD;  Location: [place] CV LAB;  Service: Cardiovascular;  Laterality: N/A;  . COLONOSCOPY WITH PROPOFOL Left 02/22/2015   Procedure: COLONOSCOPY WITH PROPOFOL;  Surgeon: Hulen Luster, MD;  Location: [facility] ENDOSCOPY;  Service: Endoscopy;  Laterality: Left;  . CORONARY ANGIOPLASTY WITH STENT PLACEMENT Left 07/2012   new onset systolic CHF; elevated troponins.  Cardiac catheterization with stenting to LAD; EF 15%.  2D-echo: EF 20-25%.  . ESOPHAGOGASTRODUODENOSCOPY N/A 02/22/2015   Procedure:  ESOPHAGOGASTRODUODENOSCOPY (EGD);  Surgeon: Hulen Luster, MD;  Location: [facility] ENDOSCOPY;  Service: Endoscopy;  Laterality: N/A;  . I & D EXTREMITY Left 11/28/2020   Procedure: IRRIGATION AND DEBRIDEMENT OF FOOT;  Surgeon: Trula Slade, DPM;  Location: [place];  Service: Podiatry;  Laterality: Left;  . INCISION AND DRAINAGE ABSCESS Right 2007   groin; with ICU stay due to sepsis.  Marland Kitchen LAPAROSCOPIC CHOLECYSTECTOMY  2011  . LEFT HEART CATH AND CORONARY ANGIOGRAPHY N/A 10/05/2018   Procedure: LEFT HEART CATH AND CORONARY ANGIOGRAPHY;  Surgeon: Minna Merritts, MD;  Location: [place] CV LAB;  Service: Cardiovascular;  Laterality: N/A;  . LEFT HEART CATHETERIZATION WITH CORONARY ANGIOGRAM N/A 07/21/2012   Procedure: LEFT HEART CATHETERIZATION WITH CORONARY ANGIOGRAM;  Surgeon: Jolaine Artist, MD;  Location: [facility] CATH LAB;  Service: Cardiovascular;  Laterality: N/A;  . [place]   L4-5  . PACEMAKER REMOVAL  11/2018   due to infection around pacemaker  . PERCUTANEOUS CORONARY STENT INTERVENTION (PCI-S) N/A 07/23/2012   Procedure: PERCUTANEOUS CORONARY STENT INTERVENTION (PCI-S);  Surgeon: Sherren Mocha, MD;  Location: [facility] CATH LAB;  Service: Cardiovascular;  Laterality: N/A;  . PERIPHERAL VASCULAR BALLOON ANGIOPLASTY Left 10/06/2018   Procedure: PERIPHERAL VASCULAR BALLOON ANGIOPLASTY;  Surgeon: Katha Cabal, MD;  Location: [place] CV LAB;  Service: Cardiovascular;  Laterality: Left;  . PERIPHERAL VASCULAR CATHETERIZATION N/A 02/10/2015   Procedure: Picc Line Insertion;  Surgeon: Katha Cabal, MD;  Location: [place] CV LAB;  Service: Cardiovascular;  Laterality: N/A;  . RIGHT HEART CATH N/A 03/29/2019   Procedure: RIGHT HEART CATH;  Surgeon: Jolaine Artist, MD;  Location: [place] CV LAB;  Service: Cardiovascular;  Laterality: N/A;  . TEE WITHOUT CARDIOVERSION N/A 10/02/2018   Procedure: TRANSESOPHAGEAL ECHOCARDIOGRAM (TEE);  Surgeon: Wellington Hampshire, MD;  Location: [facility] ORS;  Service: Cardiovascular;  Laterality: N/A;  . [place]  . VAGINAL HYSTERECTOMY  01/2011   Fibroids/DUB.  Ovaries removed. [place].    Social History Social History   Tobacco  Use  . Smoking status: Never Smoker  . Smokeless tobacco: Never Used  Vaping Use  . Vaping Use: Never used  Substance Use Topics  . Alcohol use: No    Alcohol/week: 0.0 standard drinks  . Drug use: No    Family History Family History  Problem Relation Age of Onset  . Diabetes Mother   . Hypertension Mother   . Arthritis Mother        knees, lumbar DDD, cervical DDD  . Cancer Father        prostate,skin,lymphoma.  . Cancer Brother 46       bladder cancer; non-smoker  . Diabetes Maternal Grandmother   . Heart disease Maternal Grandmother   . COPD Maternal Grandmother   . Diabetes Paternal Grandmother   . Obesity Brother   . Diabetes Son   . Hypertension Son   . Cancer Maternal Grandfather   . Diabetes Paternal Grandfather     No Known Allergies   REVIEW OF SYSTEMS  General: _0  Weight loss, _1  Fever, _2  chills Neurologic: _3  Dizziness, _4  Blackouts, _5  Seizure _6  Stroke, _7  "Mini stroke", _8  Slurred speech, _9  Temporary blindness; _10  weakness in arms or legs, _11  Hoarseness _12  Dysphagia Cardiac: _13  Chest pain/pressure, _14  Shortness of breath at rest _15  Shortness of breath with exertion, _16  Atrial fibrillation or irregular heartbeat  Vascular: _17  Pain in legs with walking, _18  Pain in legs at rest, _19  Pain in legs at night,  [x ] Non-healing ulcer, _20  Blood clot in vein/DVT,   Pulmonary: _21  Home oxygen, _22  Productive cough, _23  Coughing up blood, _24  Asthma,  _25  Wheezing _26  COPD Musculoskeletal:  _27  Arthritis, _28  Low back pain, _29  Joint pain Hematologic: _30  Easy Bruising, _31  Anemia; _32  Hepatitis Gastrointestinal: _33  Blood in stool, _34  Gastroesophageal Reflux/heartburn, Urinary: [x ] chronic Kidney disease, _35  on HD - [  ] MWF or _36  TTHS, _37  Burning with urination, _38  Difficulty urinating Skin: _39  Rashes, _40  Wounds Psychological: _41  Anxiety, _42  Depression  Physical Examination Vitals:   01/29/21 1809 01/29/21 2315 01/30/21 0444 01/30/21 1232  BP: (!) 107/49 (!) 131/53 (!) 133/55 (!) 101/48  Pulse: (!) 58 (!) 59 61 (!) 56  Resp: _43 Temp: 98.9 F (37.2 C) 98.9 F (37.2 C) 98.6 F (37 C) 98.5 F (36.9 C)  TempSrc: Oral Oral Oral Oral  SpO2: 99% 97% 99% 98%  Weight:      Height:       Body mass index is 41.13 kg/m.  General:  WDWN in NAD Gait: Normal HENT: WNL Eyes: Pupils equal Pulmonary: normal non-labored breathing , without Rales, rhonchi,  wheezing Cardiac: RRR, without  Murmurs, rubs or gallops; No carotid bruits Abdomen: soft, NT, no masses Skin: no rashes, ulcers noted;  no Gangrene , no cellulitis; no open wounds;   Vascular Exam/Pulses:Palpable femoral pulses B, right DP/PT palpable.      Musculoskeletal: no muscle wasting or atrophy; no edema  Neurologic: A&O X 3; Appropriate Affect ;  SENSATION: normal; MOTOR FUNCTION: 5/5 Symmetric Speech is fluent/normal   Significant Diagnostic Studies: CBC Lab Results  Component Value Date   WBC 7.9 01/30/2021   HGB 10.1 (L) 01/30/2021   HCT 31.5 (L) 01/30/2021   MCV 96.3  01/30/2021   PLT 161 01/30/2021    BMET    Component Value Date/Time   NA 137 01/30/2021 0250   NA 140 09/06/2019 1539   K 4.1 01/30/2021 0250   CL 98 01/30/2021 0250   CO2 28 01/30/2021 0250   GLUCOSE 158 (H) 01/30/2021 0250   BUN 44 (H) 01/30/2021 0250   BUN 62 (H) 09/06/2019 1539   CREATININE 2.21 (H) 01/30/2021 0250   CREATININE 1.51 (H) 08/06/2016 1431   CALCIUM 8.8 (L) 01/30/2021 0250   GFRNONAA 24 (L) 01/30/2021 0250   GFRNONAA 46 (L) 12/21/2013 1132   GFRAA 23 (L) 11/05/2019 1327   GFRAA 53 (L) 12/21/2013 1132   Estimated Creatinine Clearance: 34.5 mL/min (A) (by C-G formula based on SCr of 2.21 mg/dL (H)).  COAG Lab  Results  Component Value Date   INR 1.2 11/28/2020   INR 1.0 10/02/2020   INR 1.1 12/14/2018     Non-Invasive Vascular Imaging:     ABI Findings:  +---------+------------------+-----+---------+--------+  Right  Rt Pressure (mmHg)IndexWaveform Comment   +---------+------------------+-----+---------+--------+  Brachial 144           triphasic      +---------+------------------+-----+---------+--------+  PTA   177        1.23 triphasic      +---------+------------------+-----+---------+--------+  DP    149        1.03 triphasic      +---------+------------------+-----+---------+--------+  Great Toe98        0.68            +---------+------------------+-----+---------+--------+   +---------+------------------+-----+----------+-------+  Left   Lt Pressure (mmHg)IndexWaveform Comment  +---------+------------------+-----+----------+-------+  Brachial 138           triphasic       +---------+------------------+-----+----------+-------+  PTA   123        0.85 monophasic      +---------+------------------+-----+----------+-------+  DP    73        0.51 monophasic      +---------+------------------+-----+----------+-------+  Great Toe20        0.14 Abnormal       +---------+------------------+-----+----------+-------+   +-------+-----------+-----------+------------+------------+  ABI/TBIToday's ABIToday's TBIPrevious ABIPrevious TBI  +-------+-----------+-----------+------------+------------+  Right 1.23    0.68                  +-------+-----------+-----------+------------+------------+  Left  0.85    0.14                  +-------+-----------+-----------+------------+------------+       Summary:  Right: Resting right ankle-brachial index is within  normal range. No  evidence of significant right lower extremity arterial disease.   Left: Resting left ankle-brachial index indicates mild left lower  extremity arterial disease. The left toe-brachial index is abnormal. LT  Great toe pressure   MRI left foot IMPRESSION: Skin ulceration on the plantar surface of the foot at the level of the base the proximal phalanx of the great toe with an underlying heterogeneous fluid collection worrisome for abscess.  Marrow edema in the medial and lateral sesamoids and proximal phalanx of the great toe as described above is consistent with osteomyelitis and new since the prior MRI.  Osteomyelitis in the periphery of the head of the fifth metatarsal and base of the proximal phalanx of the little toe is new since the prior MRI and most consistent with osteomyelitis. There appears to be a skin ulceration adjacent to the fifth metatarsal without underlying abscess.  Subcutaneous edema in the dorsal soft tissues of  the foot could be due to dependent change or cellulitis.  ASSESSMENT/PLAN:  DM left foot non healing plantar wound first met head. Cellulitis with Osteomyelitis in the periphery of the head of the fifth metatarsal and base of the proximal phalanx of the little toe is new since the prior MRI and most consistent with osteomyelitis.  From a vascular point of view she has PAD with infra inguinal and tibial disease.  I would recommend angiogram with CO2 secondary to CKD stage IV.  This may be possible Friday to improve her chances of healing a possible first metatarsal amputation.    Podiatry has her on the schedule for tomorrow Dr. Posey Pronto.       Roxy Horseman 01/30/2021 1:57 PM

## 2021-01-30 NOTE — Progress Notes (Signed)
Pharmacy Antibiotic Note  [name] is a 65 y.o. female admitted on 01/28/2021 with worsening left foot infection and possible osteomyelitis.  Pharmacy has been consulted for Vancomycin dosing. Also on Cefepime.  Was on Clindamycin PTA.     CKD, creatinine 2.87 on admit and improved to 2.21    Vancomcyin 2 gm IV loading dose given 4/24 pm and Vancomycin 1500 mg IV q48h to begin tonight. Regimen remains appropriate.  Also on Cefepime 2gm IV q24.  Podiatry planning I&D vs possible 1st ray amputation on 4/27.  Plan: Continue Vancomycin 1500 mg IV Q 48 hrs.  Goal AUC 400-550. Expected AUC: 468 SCr used: 2.21 Continue Cefepime 2 gm IV q24hrs. Borderline for needing change to q12h dosing. Will plan to increase on 4/27 if creatinine trends down further. Will follow renal function, culture data, clinical progress, OR procedure, and antibiotic plans.   Height: 5\' 8"  (172.7 cm) Weight: 122.7 kg (270 lb 8.1 oz) IBW/kg (Calculated) : 63.9  Temp (24hrs), Avg:98.8 F (37.1 C), Min:98.5 F (36.9 C), Max:98.9 F (37.2 C)  Recent Labs  Lab 01/24/21 0953 01/28/21 1935 01/28/21 2007 01/28/21 2310 01/29/21 0624 01/30/21 0250  WBC  --  11.9*  --  11.4* 10.1 7.9  CREATININE 3.02* 2.87*  --  2.75* 2.48* 2.21*  LATICACIDVEN  --   --  1.5 1.2  --   --     Estimated Creatinine Clearance: 34.5 mL/min (A) (by C-G formula based on SCr of 2.21 mg/dL (H)).    No Known Allergies  Antimicrobials this admission:  Vancomycin 4/24 >>  Cefepime 4/24>>  Zosyn x 1 on 4/24  Dose adjustments this admission:  n/a  Microbiology results:  4/24 blood x 2: no growth x 2 days to date  4/24 COVID and flu: negative  Thank you for allowing pharmacy to be a part of this patient's care.  Arty Baumgartner, [place] 01/30/2021 3:58 PM

## 2021-01-30 NOTE — Anesthesia Preprocedure Evaluation (Addendum)
Anesthesia Evaluation  Patient identified by MRN, date of birth, ID band Patient awake    Reviewed: Allergy & Precautions, NPO status , Patient's Chart, lab work & pertinent test results, reviewed documented beta blocker date and time   Airway Mallampati: III  TM Distance: >3 FB Neck ROM: Full    Dental no notable dental hx. (+) Teeth Intact, Missing, Dental Advisory Given,    Pulmonary sleep apnea and Continuous Positive Airway Pressure Ventilation ,    Pulmonary exam normal breath sounds clear to auscultation       Cardiovascular hypertension, Pt. on medications and Pt. on home beta blockers + CAD, + Past MI, + Cardiac Stents and +CHF (grade 1 diastolic dysfunction)  Normal cardiovascular exam+ dysrhythmias Atrial Fibrillation + Cardiac Defibrillator + Valvular Problems/Murmurs (mild to mod MR) MR  Rhythm:Regular Rate:Normal  Last echo 03/2020: 1. Left ventricular ejection fraction, by estimation, is 50 to 55%. The  left ventricle has low normal function. The left ventricle has no regional  wall motion abnormalities. Left ventricular diastolic parameters are  consistent with Grade I diastolic  dysfunction (impaired relaxation).  2. Right ventricular systolic function is low normal.  3. Mild to moderate mitral valve regurgitation.    Last cath 2020 while on milrinone for HF: 1. Persistently elevated biventricular pressures with mildly reduced CO on milrinone 2. Evidence of severe MR and TR in tracings 3. Frequent polymorphic PVCs throughout case    Neuro/Psych PSYCHIATRIC DISORDERS Depression negative neurological ROS     GI/Hepatic negative GI ROS, Neg liver ROS,   Endo/Other  diabetes, Well Controlled, Type 2, Insulin Dependent, Oral Hypoglycemic AgentsHypothyroidism Morbid obesityBMI 41 FS 126  Renal/GU Renal Insufficiency and CRFRenal diseaseCKD 4, Cr 2.2  negative genitourinary   Musculoskeletal Abscess L foot    Abdominal (+) + obese,   Peds  Hematology negative hematology ROS (+)   Anesthesia Other Findings   Reproductive/Obstetrics negative OB ROS                            Anesthesia Physical Anesthesia Plan  ASA: IV  Anesthesia Plan: General   Post-op Pain Management:    Induction:   PONV Risk Score and Plan: 3 and Ondansetron, Dexamethasone and Midazolam  Airway Management Planned: LMA  Additional Equipment: None  Intra-op Plan:   Post-operative Plan: Extubation in OR  Informed Consent: I have reviewed the patients History and Physical, chart, labs and discussed the procedure including the risks, benefits and alternatives for the proposed anesthesia with the patient or authorized representative who has indicated his/her understanding and acceptance.     Dental advisory given  Plan Discussed with: CRNA  Anesthesia Plan Comments:        Anesthesia Quick Evaluation

## 2021-01-30 NOTE — Progress Notes (Signed)
Patient here today for MRI of left foot wo contrast. She is currently an inpatient from [place], has SWOT RN to monitor the patient. [employer] device. [employer] express sent. Orders received for DOO 70. Will re-program once scan is complete

## 2021-01-30 NOTE — Progress Notes (Addendum)
Progress Note    LETTI TOWELL  JGG:[id number] DOB: [birth year]  DOA: 01/28/2021 PCP: [name], MD    Brief Narrative:     Medical records reviewed and are as summarized below:  [name] is an 65 y.o. female with medical history significant for DMT2 with neuropathy, CKD 4, HFrEF, CAD, has AICD, diabetic foot ulcer on left, HTN, HLD, morbid obesity who presents for evaluation of worsening infection in left foot.  Seen a month ago in the emergency room and placed on clindamycin for diabetic foot ulcer with cellulitis at that time.  She follows up with podiatry as an outpatient and continued on the clindamycin for the last month.  Despite the antibiotic she has worsening swelling and redness of her left first toe and midfoot.  MRI pending and plan per podiatry will be performed based on results.     Assessment/Plan:   Principal Problem:   Cellulitis of left foot Active Problems:   Chronic systolic congestive heart failure (HCC)   Diabetic peripheral neuropathy associated with type 2 diabetes mellitus (HCC)   Essential hypertension, benign   DM type 2, uncontrolled, with renal complications (HCC)   Diabetic foot ulcer (HCC)   CKD (chronic kidney disease) stage 4, GFR 15-29 ml/min (HCC)   AICD (automatic cardioverter/defibrillator) present   Cellulitis of left foot - vancomycin and cefepime for empiric antibiotic coverage.  Patient has been on clindamycin for the last month for infection but has worsened over that time. Pain control provided as needed. Podiatry following-- await MRI results - concern for osteomyelitis: MRI pending -X-ray reveals soft tissue swelling and gas at the base of the first toe of left foot.  Possible abscess- MRI Pending (pacemaker is compatible as has had MRI in past)   Diabetic peripheral neuropathy associated with type 2 diabetes mellitus  -continue meds    Essential hypertension, benign Continue Coreg and Imdur.    DM type  2, uncontrolled, with renal complications  Continue basal insulin with lantus. Continue Jardiance.  -SSI    Diabetic foot ulcer  Chronic and has not improved with outpatient therapy.  -ABI : right normal, left mild- podiatry requests vascular to see- consult placed    CKD (chronic kidney disease) stage 4, GFR 15-29 ml/min  Stable. Avoid nephrotoxic medications.     Chronic systolic congestive heart failure  Stable.   obesity Body mass index is 41.13 kg/m.   Family Communication/Anticipated D/C date and plan/Code Status   DVT prophylaxis: heparin Code Status: Full Code.  Disposition Plan: Status is: Inpatient  Remains inpatient appropriate because:Inpatient level of care appropriate due to severity of illness   Dispo: The patient is from: Home              Anticipated d/c is to: Home              Patient currently is not medically stable to d/c.   Difficult to place patient No         Medical Consultants:    Podiatry  vascular     Subjective:   No SOB, no CP  Objective:    Vitals:   01/29/21 1807 01/29/21 1809 01/29/21 2315 01/30/21 0444  BP: (!) 107/49 (!) 107/49 (!) 131/53 (!) 133/55  Pulse: (!) 58 (!) 58 (!) 59 61  Resp: 16 16 18 16   Temp: 98.9 F (37.2 C) 98.9 F (37.2 C) 98.9 F (37.2 C) 98.6 F (37 C)  TempSrc: Oral Oral Oral  Oral  SpO2: 99% 99% 97% 99%  Weight:      Height:        Intake/Output Summary (Last 24 hours) at 01/30/2021 0849 Last data filed at 01/30/2021 0447 Gross per 24 hour  Intake 360 ml  Output 1000 ml  Net -640 ml   Filed Weights   01/28/21 1941  Weight: 122.7 kg    Exam:  General: Appearance:    Obese female in no acute distress  Skin:  multiple senile skin lesions on UE  Lungs:     Clear to auscultation bilaterally, respirations unlabored  Heart:    Normal heart rate.   MS:   Left foot wrapped  Neurologic:   Awake, alert, oriented x 3. No apparent focal neurological           defect.      Data  Reviewed:   I have personally reviewed following labs and imaging studies:  Labs: Labs show the following:   Basic Metabolic Panel: Recent Labs  Lab 01/24/21 0953 01/28/21 1935 01/28/21 2310 01/29/21 0624 01/30/21 0250  NA 142 135  --  139 137  K 4.6 4.3  --  3.9 4.1  CL 96 94*  --  101 98  CO2 35* 33*  --  30 28  GLUCOSE 104* 331*  --  141* 158*  BUN 48* 55*  --  49* 44*  CREATININE 3.02* 2.87* 2.75* 2.48* 2.21*  CALCIUM 9.7 8.9  --  8.6* 8.8*   GFR Estimated Creatinine Clearance: 34.5 mL/min (A) (by C-G formula based on SCr of 2.21 mg/dL (H)). Liver Function Tests: No results for input(s): AST, ALT, ALKPHOS, BILITOT, PROT, ALBUMIN in the last 168 hours. No results for input(s): LIPASE, AMYLASE in the last 168 hours. No results for input(s): AMMONIA in the last 168 hours. Coagulation profile No results for input(s): INR, PROTIME in the last 168 hours.  CBC: Recent Labs  Lab 01/28/21 1935 01/28/21 2310 01/29/21 0624 01/30/21 0250  WBC 11.9* 11.4* 10.1 7.9  NEUTROABS 9.5*  --   --   --   HGB 11.5* 10.7* 10.6* 10.1*  HCT 35.7* 33.3* 32.6* 31.5*  MCV 96.7 95.7 96.4 96.3  PLT 179 166 160 161   Cardiac Enzymes: No results for input(s): CKTOTAL, CKMB, CKMBINDEX, TROPONINI in the last 168 hours. BNP (last 3 results) No results for input(s): PROBNP in the last 8760 hours. CBG: Recent Labs  Lab 01/29/21 0616 01/29/21 1135 01/29/21 1603 01/29/21 2059 01/30/21 0558  GLUCAP 146* 142* 197* 186* 144*   D-Dimer: No results for input(s): DDIMER in the last 72 hours. Hgb A1c: Recent Labs    01/28/21 2310  HGBA1C 8.1*   Lipid Profile: No results for input(s): CHOL, HDL, LDLCALC, TRIG, CHOLHDL, LDLDIRECT in the last 72 hours. Thyroid function studies: No results for input(s): TSH, T4TOTAL, T3FREE, THYROIDAB in the last 72 hours.  Invalid input(s): FREET3 Anemia work up: No results for input(s): VITAMINB12, FOLATE, FERRITIN, TIBC, IRON, RETICCTPCT in the last  72 hours. Sepsis Labs: Recent Labs  Lab 01/28/21 1935 01/28/21 2007 01/28/21 2310 01/29/21 0624 01/30/21 0250  WBC 11.9*  --  11.4* 10.1 7.9  LATICACIDVEN  --  1.5 1.2  --   --     Microbiology Recent Results (from the past 240 hour(s))  Blood culture (routine x 2)     Status: None (Preliminary result)   Collection Time: 01/28/21  8:41 PM   Specimen: BLOOD RIGHT WRIST  Result Value Ref Range  Status   Specimen Description BLOOD RIGHT WRIST  Final   Special Requests   Final    BOTTLES DRAWN AEROBIC AND ANAEROBIC Blood Culture adequate volume   Culture   Final    NO GROWTH 2 DAYS Performed at [facility], [address] [place], [place] [address]    Report Status PENDING  Incomplete  Blood culture (routine x 2)     Status: None (Preliminary result)   Collection Time: 01/28/21  8:55 PM   Specimen: BLOOD LEFT ARM  Result Value Ref Range Status   Specimen Description BLOOD LEFT ARM  Final   Special Requests   Final    BOTTLES DRAWN AEROBIC AND ANAEROBIC Blood Culture adequate volume   Culture   Final    NO GROWTH 2 DAYS Performed at [facility], [place] [address] [place], [place] [address]    Report Status PENDING  Incomplete  Resp Panel by RT-PCR (Flu A&B, Covid) Nasopharyngeal Swab     Status: None   Collection Time: 01/28/21  9:39 PM   Specimen: Nasopharyngeal Swab; Nasopharyngeal(NP) swabs in vial transport medium  Result Value Ref Range Status   SARS Coronavirus 2 by RT PCR NEGATIVE NEGATIVE Final    Comment: (NOTE) SARS-CoV-2 target nucleic acids are NOT DETECTED.  The SARS-CoV-2 RNA is generally detectable in upper respiratory specimens during the acute phase of infection. The lowest concentration of SARS-CoV-2 viral copies this assay can detect is 138 copies/mL. A negative result does not preclude SARS-Cov-2 infection and should not be used as the sole basis for treatment or other patient management decisions. A negative result may occur with   improper specimen collection/handling, submission of specimen other than nasopharyngeal swab, presence of viral mutation(s) within the areas targeted by this assay, and inadequate number of viral copies(<138 copies/mL). A negative result must be combined with clinical observations, patient history, and epidemiological information. The expected result is Negative.  Fact Sheet for Patients:  EntrepreneurPulse.com.au  Fact Sheet for Healthcare Providers:  IncredibleEmployment.be  This test is no t yet approved or cleared by the [place] FDA and  has been authorized for detection and/or diagnosis of SARS-CoV-2 by FDA under an Emergency Use Authorization (EUA). This EUA will remain  in effect (meaning this test can be used) for the duration of the COVID-19 declaration under Section 564(b)(1) of the Act, 21 U.S.C.section 360bbb-3(b)(1), unless the authorization is terminated  or revoked sooner.       Influenza A by PCR NEGATIVE NEGATIVE Final   Influenza B by PCR NEGATIVE NEGATIVE Final    Comment: (NOTE) The Xpert Xpress SARS-CoV-2/FLU/RSV plus assay is intended as an aid in the diagnosis of influenza from Nasopharyngeal swab specimens and should not be used as a sole basis for treatment. Nasal washings and aspirates are unacceptable for Xpert Xpress SARS-CoV-2/FLU/RSV testing.  Fact Sheet for Patients: EntrepreneurPulse.com.au  Fact Sheet for Healthcare Providers: IncredibleEmployment.be  This test is not yet approved or cleared by the [place] FDA and has been authorized for detection and/or diagnosis of SARS-CoV-2 by FDA under an Emergency Use Authorization (EUA). This EUA will remain in effect (meaning this test can be used) for the duration of the COVID-19 declaration under Section 564(b)(1) of the Act, 21 U.S.C. section 360bbb-3(b)(1), unless the authorization is terminated  or revoked.  Performed at [facility], [place] [address], [place], [place] [address]     Procedures and diagnostic studies:  DG Toe Great Left  Result  Date: 01/28/2021 CLINICAL DATA:  Infection to the great toe for 3 weeks. On antibiotics. EXAM: LEFT GREAT TOE COMPARISON:  01/13/2021 FINDINGS: Previous amputation of the second toe at the metatarsal phalangeal joint level. Soft tissue swelling at the base of the first toe laterally, extending into the space between the first and second toes. Soft tissue gas is present. Soft tissue swelling and gas or increasing since previous study, likely representing expanding abscess. No cortical erosion or bone sclerosis to suggest osteomyelitis. Degenerative changes in the intertarsal joints. IMPRESSION: 1. Soft tissue swelling and gas at the base of the first toe, increasing since prior study, likely representing expanding abscess. 2. No radiographic evidence of osteomyelitis. 3. Previous amputation of the second toe. Electronically Signed   By: Lucienne Capers M.D.   On: 01/28/2021 19:31   VAS [place] ABI WITH/WO TBI  Result Date: 01/29/2021  LOWER EXTREMITY DOPPLER STUDY Patient Name:  JAMILEX BOHNSACK  Date of Exam:   01/29/2021 Medical Rec #: [id number]          Accession #:    [id number] Date of Birth: 1956/08/27           Patient Gender: F Patient Age:   64Y Exam Location:  [facility] Procedure:      VAS [place] ABI WITH/WO TBI Referring Phys: [id number] KEVIN P PATEL --------------------------------------------------------------------------------  Indications: Ulceration, and peripheral artery disease. High Risk Factors: Hypertension, Diabetes, prior MI, coronary artery disease,                    prior CVA.  Comparison Study: 10-06-2018 Peripheral vascular balloon angioplasty showed                   heavy disease of distal popliteal artery/trifurcation with                   >90% stenosis of the ATA, diffuse disease of the peroneal, and                    widely patent PTA. Documented successful recanalization. Performing Technologist: Darlin Coco RDMS RVT  Examination Guidelines: A complete evaluation includes at minimum, Doppler waveform signals and systolic blood pressure reading at the level of bilateral brachial, anterior tibial, and posterior tibial arteries, when vessel segments are accessible. Bilateral testing is considered an integral part of a complete examination. Photoelectric Plethysmograph (PPG) waveforms and toe systolic pressure readings are included as required and additional duplex testing as needed. Limited examinations for reoccurring indications may be performed as noted.  ABI Findings: +---------+------------------+-----+---------+--------+ Right    Rt Pressure (mmHg)IndexWaveform Comment  +---------+------------------+-----+---------+--------+ Brachial 144                    triphasic         +---------+------------------+-----+---------+--------+ PTA      177               1.23 triphasic         +---------+------------------+-----+---------+--------+ DP       149               1.03 triphasic         +---------+------------------+-----+---------+--------+ Great Toe98                0.68                   +---------+------------------+-----+---------+--------+ +---------+------------------+-----+----------+-------+ Left     Lt Pressure (mmHg)IndexWaveform  Comment +---------+------------------+-----+----------+-------+ Brachial 138  triphasic         +---------+------------------+-----+----------+-------+ PTA      123               0.85 monophasic        +---------+------------------+-----+----------+-------+ DP       73                0.51 monophasic        +---------+------------------+-----+----------+-------+ Great Toe20                0.14 Abnormal          +---------+------------------+-----+----------+-------+  +-------+-----------+-----------+------------+------------+ ABI/TBIToday's ABIToday's TBIPrevious ABIPrevious TBI +-------+-----------+-----------+------------+------------+ Right  1.23       0.68                                +-------+-----------+-----------+------------+------------+ Left   0.85       0.14                                +-------+-----------+-----------+------------+------------+  Summary: Right: Resting right ankle-brachial index is within normal range. No evidence of significant right lower extremity arterial disease. Left: Resting left ankle-brachial index indicates mild left lower extremity arterial disease. The left toe-brachial index is abnormal. LT Great toe pressure = 20 mmHg.  *See table(s) above for measurements and observations.  Electronically signed by Ruta Hinds MD on 01/29/2021 at 4:08:15 PM.    Final     Medications:   . amiodarone  200 mg Oral q morning  . carvedilol  3.125 mg Oral Daily  . clopidogrel  75 mg Oral Daily  . DULoxetine  30 mg Oral QHS  . empagliflozin  10 mg Oral QAC breakfast  . ferrous sulfate  325 mg Oral Q breakfast  . gabapentin  300 mg Oral BID  . heparin  5,000 Units Subcutaneous Q8H  . insulin aspart  0-20 Units Subcutaneous TID WC  . insulin aspart  0-5 Units Subcutaneous QHS  . insulin glargine  22 Units Subcutaneous QHS  . isosorbide mononitrate  30 mg Oral Daily  . levothyroxine  50 mcg Oral Q0600  . losartan  12.5 mg Oral q morning  . torsemide  20 mg Oral BID   Continuous Infusions: . ceFEPime (MAXIPIME) IV 2 g (01/29/21 2124)  . lactated ringers 50 mL/hr at 01/29/21 2122  . vancomycin       LOS: 2 days   Geradine Girt  [facility]   How to contact the [facility] Attending or Consulting provider [place] or covering provider during after hours [place], for this patient?  1. Check the care team in [facility] and look for a) attending/consulting [facility] provider listed and b) the [facility] team listed 2. Log into  [email or web address] and use [place]'s universal password to access. If you do not have the password, please contact the hospital operator. 3. Locate the [facility] provider you are looking for under [facility] and page to a number that you can be directly reached. 4. If you still have difficulty reaching the provider, please page the [facility] (Director on Call) for the Hospitalists listed on amion for assistance.  01/30/2021, 8:49 AM

## 2021-01-31 ENCOUNTER — Inpatient Hospital Stay (HOSPITAL_COMMUNITY): Payer: HMO | Admitting: Anesthesiology

## 2021-01-31 ENCOUNTER — Encounter (HOSPITAL_COMMUNITY)
Admission: EM | Disposition: A | Discharge status: Home Health | Payer: Self-pay | Source: Home / Self Care | Attending: Family Medicine

## 2021-01-31 ENCOUNTER — Encounter (HOSPITAL_COMMUNITY): Payer: Self-pay | Admitting: Family Medicine

## 2021-01-31 DIAGNOSIS — L03116 Cellulitis of left lower limb: Secondary | ICD-10-CM

## 2021-01-31 HISTORY — PX: I & D EXTREMITY: SHX5045

## 2021-01-31 LAB — GLUCOSE, CAPILLARY
Glucose-Capillary: 143 mg/dL — ABNORMAL HIGH (ref 70–99)
Glucose-Capillary: 126 mg/dL — ABNORMAL HIGH (ref 70–99)
Glucose-Capillary: 154 mg/dL — ABNORMAL HIGH (ref 70–99)
Glucose-Capillary: 226 mg/dL — ABNORMAL HIGH (ref 70–99)
Glucose-Capillary: 304 mg/dL — ABNORMAL HIGH (ref 70–99)

## 2021-01-31 SURGERY — IRRIGATION AND DEBRIDEMENT EXTREMITY
Anesthesia: General | Site: Foot | Laterality: Left

## 2021-01-31 MED ORDER — ONDANSETRON HCL 4 MG/2ML IJ SOLN
INTRAMUSCULAR | Status: DC | PRN
  Administered 2021-01-31: 4 mg via INTRAVENOUS

## 2021-01-31 MED ORDER — OXYCODONE HCL 5 MG/5ML PO SOLN: 5.0000 mg | Freq: Once | ORAL | Status: DC | PRN

## 2021-01-31 MED ORDER — BUPIVACAINE HCL 0.25 % IJ SOLN
INTRAMUSCULAR | Status: DC | PRN
  Administered 2021-01-31: 5 mL

## 2021-01-31 MED ORDER — ONDANSETRON HCL 4 MG/2ML IJ SOLN
4.0000 mg | Freq: Once | INTRAMUSCULAR | Status: DC | PRN
Start: 2021-01-31 — End: 2021-01-31

## 2021-01-31 MED ORDER — BUPIVACAINE HCL (PF) 0.25 % IJ SOLN
INTRAMUSCULAR | Status: AC
  Filled 2021-01-31: qty 30

## 2021-01-31 MED ORDER — PROPOFOL 10 MG/ML IV BOLUS
INTRAVENOUS | Status: DC | PRN
  Administered 2021-01-31: 150 mg via INTRAVENOUS

## 2021-01-31 MED ORDER — FENTANYL CITRATE (PF) 100 MCG/2ML IJ SOLN: 25.0000 ug | INTRAMUSCULAR | Status: DC | PRN

## 2021-01-31 MED ORDER — ORAL CARE MOUTH RINSE: 15.0000 mL | Freq: Once | OROMUCOSAL | Status: AC

## 2021-01-31 MED ORDER — MIDAZOLAM HCL 2 MG/2ML IJ SOLN
INTRAMUSCULAR | Status: AC
  Filled 2021-01-31: qty 2

## 2021-01-31 MED ORDER — FENTANYL CITRATE (PF) 250 MCG/5ML IJ SOLN
INTRAMUSCULAR | Status: AC
  Filled 2021-01-31: qty 5

## 2021-01-31 MED ORDER — LIDOCAINE 1 % OPTIME INJ - NO CHARGE
INTRAMUSCULAR | Status: DC | PRN
  Administered 2021-01-31: 5 mL

## 2021-01-31 MED ORDER — LIDOCAINE HCL (PF) 1 % IJ SOLN
INTRAMUSCULAR | Status: AC
  Filled 2021-01-31: qty 30

## 2021-01-31 MED ORDER — ACETAMINOPHEN 500 MG PO TABS
1000.0000 mg | ORAL_TABLET | Freq: Once | ORAL | Status: AC
  Administered 2021-01-31: 1000 mg via ORAL
  Filled 2021-01-31: qty 2

## 2021-01-31 MED ORDER — CHLORHEXIDINE GLUCONATE 0.12 % MT SOLN
OROMUCOSAL | Status: AC
  Administered 2021-01-31: 15 mL via OROMUCOSAL
  Filled 2021-01-31: qty 15

## 2021-01-31 MED ORDER — SODIUM CHLORIDE 0.9 % IV SOLN
2.0000 g | Freq: Two times a day (BID) | INTRAVENOUS | Status: DC
  Administered 2021-01-31 – 2021-02-05 (×10): 2 g via INTRAVENOUS
  Filled 2021-01-31 (×11): qty 2

## 2021-01-31 MED ORDER — LIDOCAINE 2% (20 MG/ML) 5 ML SYRINGE
INTRAMUSCULAR | Status: DC | PRN
  Administered 2021-01-31: 60 mg via INTRAVENOUS

## 2021-01-31 MED ORDER — MIDAZOLAM HCL 2 MG/2ML IJ SOLN
INTRAMUSCULAR | Status: DC | PRN
  Administered 2021-01-31 (×2): 1 mg via INTRAVENOUS

## 2021-01-31 MED ORDER — OXYCODONE HCL 5 MG PO TABS: 5.0000 mg | ORAL_TABLET | Freq: Once | ORAL | Status: DC | PRN

## 2021-01-31 MED ORDER — EPHEDRINE SULFATE 50 MG/ML IJ SOLN
INTRAMUSCULAR | Status: DC | PRN
  Administered 2021-01-31: 5 mg via INTRAVENOUS
  Administered 2021-01-31: 10 mg via INTRAVENOUS
  Administered 2021-01-31 (×2): 5 mg via INTRAVENOUS

## 2021-01-31 MED ORDER — PHENYLEPHRINE 40 MCG/ML (10ML) SYRINGE FOR IV PUSH (FOR BLOOD PRESSURE SUPPORT)
PREFILLED_SYRINGE | INTRAVENOUS | Status: DC | PRN
  Administered 2021-01-31: 80 ug via INTRAVENOUS
  Administered 2021-01-31 (×2): 40 ug via INTRAVENOUS

## 2021-01-31 MED ORDER — CHLORHEXIDINE GLUCONATE 0.12 % MT SOLN: 15.0000 mL | Freq: Once | OROMUCOSAL | Status: AC

## 2021-01-31 MED ORDER — SODIUM CHLORIDE 0.9 % IR SOLN
Status: DC | PRN
  Administered 2021-01-31: 3000 mL

## 2021-01-31 MED ORDER — PROPOFOL 10 MG/ML IV BOLUS
INTRAVENOUS | Status: AC
  Filled 2021-01-31: qty 20

## 2021-01-31 MED ORDER — DEXAMETHASONE SODIUM PHOSPHATE 10 MG/ML IJ SOLN
INTRAMUSCULAR | Status: DC | PRN
  Administered 2021-01-31: 5 mg via INTRAVENOUS

## 2021-01-31 MED ORDER — LACTATED RINGERS IV SOLN: INTRAVENOUS | Status: DC

## 2021-01-31 MED ORDER — FENTANYL CITRATE (PF) 250 MCG/5ML IJ SOLN
INTRAMUSCULAR | Status: DC | PRN
  Administered 2021-01-31: 25 ug via INTRAVENOUS

## 2021-01-31 SURGICAL SUPPLY — 60 items
APL PRP STRL LF DISP 70% ISPRP (MISCELLANEOUS) ×1
BANDAGE ESMARK 6X9 LF (GAUZE/BANDAGES/DRESSINGS) IMPLANT
BLADE AVERAGE 25X9 (BLADE) IMPLANT
BLADE SURG 15 STRL LF DISP TIS (BLADE) ×2 IMPLANT
BLADE SURG 15 STRL SS (BLADE) ×8
BNDG CMPR 9X6 STRL LF SNTH (GAUZE/BANDAGES/DRESSINGS)
BNDG ELASTIC 3X5.8 VLCR STR LF (GAUZE/BANDAGES/DRESSINGS) IMPLANT
BNDG ELASTIC 4X5.8 VLCR STR LF (GAUZE/BANDAGES/DRESSINGS) ×2 IMPLANT
BNDG ESMARK 6X9 LF (GAUZE/BANDAGES/DRESSINGS)
BNDG GAUZE ELAST 4 BULKY (GAUZE/BANDAGES/DRESSINGS) ×3 IMPLANT
CHLORAPREP W/TINT 26 (MISCELLANEOUS) ×2 IMPLANT
CNTNR URN SCR LID CUP LEK RST (MISCELLANEOUS) IMPLANT
CONT SPEC 4OZ STRL OR WHT (MISCELLANEOUS) ×2
COVER BACK TABLE 60X90IN (DRAPES) ×2 IMPLANT
COVER WAND RF STERILE (DRAPES) IMPLANT
CUFF TOURN SGL QUICK 34 (TOURNIQUET CUFF)
CUFF TRNQT CYL 34X4.125X (TOURNIQUET CUFF) IMPLANT
DRAPE EXTREMITY T 121X128X90 (DISPOSABLE) ×2 IMPLANT
DRAPE IMP U-DRAPE 54X76 (DRAPES) ×2 IMPLANT
DRAPE OEC MINIVIEW 54X84 (DRAPES) IMPLANT
DRAPE SURG 17X23 STRL (DRAPES) IMPLANT
DRAPE U-SHAPE 47X51 STRL (DRAPES) ×2 IMPLANT
DRSG EMULSION OIL 3X3 NADH (GAUZE/BANDAGES/DRESSINGS) ×2 IMPLANT
DRSG PAD ABDOMINAL 8X10 ST (GAUZE/BANDAGES/DRESSINGS) ×1 IMPLANT
ELECT REM PT RETURN 9FT ADLT (ELECTROSURGICAL) ×2
ELECTRODE REM PT RTRN 9FT ADLT (ELECTROSURGICAL) ×1 IMPLANT
GAUZE 4X4 16PLY RFD (DISPOSABLE) IMPLANT
GAUZE SPONGE 4X4 12PLY STRL (GAUZE/BANDAGES/DRESSINGS) ×2 IMPLANT
GLOVE BIO SURGEON STRL SZ7 (GLOVE) ×2 IMPLANT
GLOVE BIOGEL PI IND STRL 7.5 (GLOVE) ×1 IMPLANT
GLOVE BIOGEL PI INDICATOR 7.5 (GLOVE) ×1
GOWN STRL REUS W/ TWL LRG LVL3 (GOWN DISPOSABLE) ×1 IMPLANT
GOWN STRL REUS W/ TWL XL LVL3 (GOWN DISPOSABLE) ×1 IMPLANT
GOWN STRL REUS W/TWL LRG LVL3 (GOWN DISPOSABLE) ×2
GOWN STRL REUS W/TWL XL LVL3 (GOWN DISPOSABLE) ×2
HANDPIECE INTERPULSE COAX TIP (DISPOSABLE) ×2
KIT ALARA NEURO ACCESS (KITS) ×2 IMPLANT
KIT BASIN OR (CUSTOM PROCEDURE TRAY) ×2 IMPLANT
NDL HYPO 25X1 1.5 SAFETY (NEEDLE) ×1 IMPLANT
NDL SAFETY ECLIPSE 18X1.5 (NEEDLE) IMPLANT
NEEDLE HYPO 18GX1.5 SHARP (NEEDLE)
NEEDLE HYPO 25X1 1.5 SAFETY (NEEDLE) ×2 IMPLANT
NS IRRIG 1000ML POUR BTL (IV SOLUTION) IMPLANT
PADDING CAST ABS 4INX4YD NS (CAST SUPPLIES) ×2
PADDING CAST ABS COTTON 4X4 ST (CAST SUPPLIES) ×2 IMPLANT
PENCIL SMOKE EVACUATOR (MISCELLANEOUS) ×2 IMPLANT
SET HNDPC FAN SPRY TIP SCT (DISPOSABLE) IMPLANT
STAPLER VISISTAT 35W (STAPLE) IMPLANT
STOCKINETTE 6  STRL (DRAPES) ×2
STOCKINETTE 6 STRL (DRAPES) ×1 IMPLANT
SUT MNCRL AB 3-0 PS2 18 (SUTURE) IMPLANT
SUT MNCRL AB 4-0 PS2 18 (SUTURE) IMPLANT
SUT MON AB 5-0 PS2 18 (SUTURE) IMPLANT
SUT PROLENE 3 0 PS 2 (SUTURE) ×2 IMPLANT
SUT PROLENE 4 0 PS 2 18 (SUTURE) IMPLANT
SWAB CULTURE LIQUID MINI MALE (MISCELLANEOUS) IMPLANT
SYR BULB EAR ULCER 3OZ GRN STR (SYRINGE) ×2 IMPLANT
SYR CONTROL 10ML LL (SYRINGE) IMPLANT
UNDERPAD 30X36 HEAVY ABSORB (UNDERPADS AND DIAPERS) ×2 IMPLANT
YANKAUER SUCT BULB TIP NO VENT (SUCTIONS) ×2 IMPLANT

## 2021-01-31 NOTE — Anesthesia Postprocedure Evaluation (Signed)
Anesthesia Post Note  Patient: [name]  Procedure(s) Performed: IRRIGATION AND DEBRIDEMENT EXTREMITY OF LEFT FOOT WITH BONE BIOPSY OF  1ST METATARSAL AND FIFTH METATARSAL (Left Foot)     Patient location during evaluation: PACU Anesthesia Type: General Level of consciousness: awake and alert, oriented and patient cooperative Pain management: pain level controlled Vital Signs Assessment: post-procedure vital signs reviewed and stable Respiratory status: spontaneous breathing, nonlabored ventilation and respiratory function stable Cardiovascular status: blood pressure returned to baseline and stable Postop Assessment: no apparent nausea or vomiting Anesthetic complications: no   No complications documented.  Last Vitals:  Vitals:   01/31/21 1335 01/31/21 1354  BP: (!) 117/52 (!) 118/53  Pulse: (!) 52 (!) 50  Resp: 12 16  Temp: (!) 36.1 C 36.6 C  SpO2: 95%     Last Pain:  Vitals:   01/31/21 1354  TempSrc: Oral  PainSc:                  Pervis Hocking

## 2021-01-31 NOTE — Progress Notes (Signed)
This rn notes approximate 3x3 inch bloody drainage on dorsal side post op left foot dressing. Pt denies discomfort, cns and posterior tib pulse remains intact. Dr Posey Pronto paged and informed of drainage. md states drainage expected d/t open wound, expect drainage to decrease by am. md states ok to give scheduled aspirin and plavix today.

## 2021-01-31 NOTE — Hospital Course (Addendum)
Potassium 5.1-->5.0 CO2 23 BUNs/creatinine 49/2.2-->56/2.3 Hemoglobin 10.6 platelet 188 WBC 8.9   Losartan held

## 2021-01-31 NOTE — Progress Notes (Signed)
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 Doren Custard  Hospital-Problem based course Osteomyelitis left foot wound Failed outpatient management on clindamycin-appreciate podiatry input-for surgery later on 1/27 At this time continue cefepime vancomycin Follow deep wound cultures and or recommendations from podiatrist CO2 angiogram planned for 4/29 by vascular surgeon NICM--removal of ICD's for bacteremia MSSA in the past Blood culture negative X2 from 4/24 Has pacemaker in place currently Would hold on further work-up at this juncture unless spikes fever, becomes bacteremic Continuing losartan 12.5 Imdur 30 Coreg 3.125 Demadex cut back to 20 mg twice daily PAD status post lower extremity interventions in the past ABIs as above Further management as per vascular surgeon Atrial fibrillation CHADS2 score >4 on amiodarone Continue amiodarone 200 at bedtime Continue Plavix 75, aspirin 81 and hold as per instructions from Dr. Doren Custard OSA on CPAP Will inquire about nighttime therapies for her DM TY 2 with neuropathy nephropathy  and CKD 4 Monitor on ARB Sugars 1 43-1 78 eating 100% of meals BMI 41 Requires further outpatient discussion of the same   DVT prophylaxis: Heparin Code Status: Full Family Communication:  Disposition:  Status is: Inpatient  Remains inpatient appropriate because:Hemodynamically unstable, Persistent severe electrolyte disturbances, Altered mental status and Ongoing diagnostic testing needed not appropriate for outpatient work up   Dispo: The patient is from: Home              Anticipated d/c is to: To be determined              Patient currently is not medically stable to d/c.   Difficult to place patient No       Consultants:   Podiatrist  Vascular surgeon  Procedures:   Antimicrobials:  Vancomycin and cefepime since 4/24   Subjective: Awake coherent no distress N.p.o. for procedure no chest pain Feels swelling in her left leg is a little diminished from prior Tells me lives with 67 year old mother Reasonably functional prior to admission-wears a boot to protect her leg  Objective: Vitals:   01/30/21 1232 01/30/21 1656 01/30/21 2358 01/31/21 0426  BP: (!) 101/48 (!) 123/53 (!) 132/50 135/65  Pulse: (!) 56 (!) 58 64 (!) 57  Resp: 17 17 18 18   Temp: 98.5 F (36.9 C) 99.3 F (37.4 C) 98.6 F (37 C) 98 F (36.7 C)  TempSrc: Oral Oral Oral Oral  SpO2: 98% 97% 97% 97%  Weight:      Height:        Intake/Output Summary (Last 24 hours) at 01/31/2021 1022 Last data filed at 01/30/2021 2100 Gross per 24 hour  Intake 1080 ml  Output --  Net 1080 ml   Filed Weights   01/28/21 1941  Weight:  122.7 kg    Examination:  Thick neck Mallampati 4 no icterus no pallor S1-S2 no murmur sinus rhythm on monitors Pacemaker in place over left chest-no fluctuance or signs of redness or infection S1-S2 no murmur Abdomen soft no rebound no guarding Chest clear no added sound no rales  Data Reviewed: personally reviewed    No labs performed today  Radiology  Studies: MR FOOT LEFT WO CONTRAST  Result Date: 01/30/2021 CLINICAL DATA:  Diabetic patient with a left foot ulcer between the first and second metatarsal heads. EXAM: MRI OF THE LEFT FOOT WITHOUT CONTRAST TECHNIQUE: Multiplanar, multisequence MR imaging of the left foot was performed. No intravenous contrast was administered. COMPARISON:  Plain films of the left great toe 01/08/2021. MRI of the left forefoot 11/27/2020. FINDINGS: Bones/Joint/Cartilage Since the prior exam, patient has developed intense marrow edema consistent with osteomyelitis in the medial and lateral sesamoids of the first MTP joint and marrow edema throughout almost the entire proximal phalanx of the great toe, worst in the proximal 1 cm of the phalanx. The patient also has new marrow edema in the lateral aspect of the distal 1.4 cm of the fifth metatarsal and proximal 1 cm of the proximal phalanx of the little toe. Midfoot osteoarthritis with subchondral cyst formation at the articulation of the navicular and middle and medial cuneiforms is noted. Bone marrow signal is otherwise negative. Ligaments Intact. Muscles and Tendons No intramuscular fluid collection. There is marked atrophy of intrinsic musculature the foot. Soft tissues There is a skin ulceration on the plantar surface of the foot at the level of the base of the proximal phalanx of the great toe. An underlying heterogeneous fluid collection measures 1.7 cm long by 1 cm craniocaudal by 1 cm transverse. There is some subcutaneous edema about the foot. Subtle focus of skin irregularity at the level of the head of the fifth metatarsal is worrisome for an ulceration. IMPRESSION: Skin ulceration on the plantar surface of the foot at the level of the base the proximal phalanx of the great toe with an underlying heterogeneous fluid collection worrisome for abscess. Marrow edema in the medial and lateral sesamoids and proximal phalanx of the great toe as described above is consistent with  osteomyelitis and new since the prior MRI. Osteomyelitis in the periphery of the head of the fifth metatarsal and base of the proximal phalanx of the little toe is new since the prior MRI and most consistent with osteomyelitis. There appears to be a skin ulceration adjacent to the fifth metatarsal without underlying abscess. Subcutaneous edema in the dorsal soft tissues of the foot could be due to dependent change or cellulitis. Electronically Signed   By: Inge Rise M.D.   On: 01/30/2021 14:21   VAS [place] ABI WITH/WO TBI  Result Date: 01/29/2021  LOWER EXTREMITY DOPPLER STUDY Patient Name:  [name]  Date of Exam:   01/29/2021 Medical Rec #: [id number]          Accession #:    [id number] Date of Birth: June 02, 1956           Patient Gender: F Patient Age:   64Y Exam Location:  [facility] Procedure:      VAS [place] ABI WITH/WO TBI Referring Phys: [id number] KEVIN P PATEL --------------------------------------------------------------------------------  Indications: Ulceration, and peripheral artery disease. High Risk Factors: Hypertension, Diabetes, prior MI, coronary artery disease,                    prior  CVA.  Comparison Study: 10-06-2018 Peripheral vascular balloon angioplasty showed                   heavy disease of distal popliteal artery/trifurcation with                   >90% stenosis of the ATA, diffuse disease of the peroneal, and                   widely patent PTA. Documented successful recanalization. Performing Technologist: Darlin Coco RDMS RVT  Examination Guidelines: A complete evaluation includes at minimum, Doppler waveform signals and systolic blood pressure reading at the level of bilateral brachial, anterior tibial, and posterior tibial arteries, when vessel segments are accessible. Bilateral testing is considered an integral part of a complete examination. Photoelectric Plethysmograph (PPG) waveforms and toe systolic pressure readings are included as required and additional  duplex testing as needed. Limited examinations for reoccurring indications may be performed as noted.  ABI Findings: +---------+------------------+-----+---------+--------+ Right    Rt Pressure (mmHg)IndexWaveform Comment  +---------+------------------+-----+---------+--------+ Brachial 144                    triphasic         +---------+------------------+-----+---------+--------+ PTA      177               1.23 triphasic         +---------+------------------+-----+---------+--------+ DP       149               1.03 triphasic         +---------+------------------+-----+---------+--------+ Great Toe98                0.68                   +---------+------------------+-----+---------+--------+ +---------+------------------+-----+----------+-------+ Left     Lt Pressure (mmHg)IndexWaveform  Comment +---------+------------------+-----+----------+-------+ Brachial 138                    triphasic         +---------+------------------+-----+----------+-------+ PTA      123               0.85 monophasic        +---------+------------------+-----+----------+-------+ DP       73                0.51 monophasic        +---------+------------------+-----+----------+-------+ Great Toe20                0.14 Abnormal          +---------+------------------+-----+----------+-------+ +-------+-----------+-----------+------------+------------+ ABI/TBIToday's ABIToday's TBIPrevious ABIPrevious TBI +-------+-----------+-----------+------------+------------+ Right  1.23       0.68                                +-------+-----------+-----------+------------+------------+ Left   0.85       0.14                                +-------+-----------+-----------+------------+------------+  Summary: Right: Resting right ankle-brachial index is within normal range. No evidence of significant right lower extremity arterial disease. Left: Resting left ankle-brachial  index indicates mild left lower extremity arterial disease. The left toe-brachial index is abnormal. LT Great toe pressure = 20 mmHg.  *See table(s) above for measurements and observations.  Electronically  signed by Ruta Hinds MD on 01/29/2021 at 4:08:15 PM.    Final      Scheduled Meds: . amiodarone  200 mg Oral q morning  . aspirin EC  81 mg Oral Daily  . atorvastatin  10 mg Oral Daily  . carvedilol  3.125 mg Oral Daily  . clopidogrel  75 mg Oral Daily  . DULoxetine  30 mg Oral QHS  . empagliflozin  10 mg Oral QAC breakfast  . ferrous sulfate  325 mg Oral Q breakfast  . gabapentin  300 mg Oral BID  . heparin  5,000 Units Subcutaneous Q8H  . insulin aspart  0-20 Units Subcutaneous TID WC  . insulin aspart  0-5 Units Subcutaneous QHS  . insulin glargine  22 Units Subcutaneous QHS  . isosorbide mononitrate  30 mg Oral Daily  . levothyroxine  50 mcg Oral Q0600  . losartan  12.5 mg Oral q morning  . torsemide  20 mg Oral BID   Continuous Infusions: . ceFEPime (MAXIPIME) IV 2 g (01/31/21 0958)  . vancomycin 1,500 mg (01/30/21 2033)     LOS: 3 days   Time spent: 44 minutes  Nita Sells, MD [facility] To contact the attending provider between 7A-7P or the covering provider during after hours 7P-7A, please log into the web site [email or web address] and access using universal  password for that web site. If you do not have the password, please call the hospital operator.  01/31/2021, 10:22 AM

## 2021-01-31 NOTE — Transfer of Care (Signed)
Immediate Anesthesia Transfer of Care Note  Patient: [name]  Procedure(s) Performed: IRRIGATION AND DEBRIDEMENT EXTREMITY OF LEFT FOOT WITH BONE BIOPSY OF  1ST METATARSAL AND FIFTH METATARSAL (Left Foot)  Patient Location: PACU  Anesthesia Type:General  Level of Consciousness: drowsy and patient cooperative  Airway & Oxygen Therapy: Patient Spontanous Breathing  Post-op Assessment: Report given to RN, Post -op Vital signs reviewed and stable and Patient moving all extremities X 4  Post vital signs: Reviewed and stable  Last Vitals:  Vitals Value Taken Time  BP    Temp    Pulse    Resp    SpO2      Last Pain:  Vitals:   01/31/21 0800  TempSrc:   PainSc: Asleep      Patients Stated Pain Goal: 0 (05/69/79 4801)  Complications: No complications documented.

## 2021-01-31 NOTE — Interval H&P Note (Signed)
History and Physical Interval Note:  01/31/2021 12:05 PM  [name]  has presented today for surgery, with the diagnosis of Abscess left foot.  The various methods of treatment have been discussed with the patient and family. After consideration of risks, benefits and other options for treatment, the patient has consented to  Procedure(s): IRRIGATION AND DEBRIDEMENT EXTREMITY with POSSIBLE AMPUTATION 1ST RAY. (Left) and left fifth metatarsal bone biopsy as well as first metatarsal bone biopsy as a surgical intervention.  The patient's history has been reviewed, patient examined, no change in status, stable for surgery.  I have reviewed the patient's chart and labs.  Questions were answered to the patient's satisfaction.     Felipa Furnace

## 2021-01-31 NOTE — Progress Notes (Signed)
VASCULAR SURGERY:  She is scheduled for a CO2 arteriogram on Friday with limited contrast.  She is on aspirin, plavix, and a statin.   01/31/2021 6:58 AM  Deitra Mayo, MD

## 2021-01-31 NOTE — Op Note (Signed)
Surgeon: Surgeon(s): Felipa Furnace, DPM  Assistants: None Pre-operative diagnosis: Abscess left foot and osteomyelitis left foot Post-operative diagnosis: same Procedure: 1.  Left foot incision and drainage washout debridement of the hallux and fifth metatarsal 2.  Bone biopsy of the left foot (hallux and fifth metatarsal) 3.  Packed primarily open 4.  Sesamoidectomy Pathology:  ID Type Source Tests Collected by Time Destination  1 : Left Foot Bone (Dirty) Tissue PATH Bone biopsy SURGICAL PATHOLOGY Felipa Furnace, DPM 01/31/2021 1245   2 : 1st Metatarsal (Clean Margin) Tissue PATH Bone biopsy SURGICAL PATHOLOGY Felipa Furnace, DPM 01/31/2021 1246   3 : Proximal Hallux (clean Margin) Tissue PATH Bone biopsy SURGICAL PATHOLOGY Felipa Furnace, DPM 01/31/2021 1246   4 : 5th Metatarsal (Clean) Tissue PATH Bone biopsy SURGICAL PATHOLOGY Felipa Furnace, DPM 01/31/2021 1247   5 : 5th Toe (clean) Tissue PATH Bone biopsy SURGICAL PATHOLOGY Felipa Furnace, DPM 01/31/2021 1247     Pertinent Intra-op findings: Purulent drainage as well as soft friable bone noted to both of hallux as well as the fifth metatarsal and Intra-Op bleeding was minimal Anesthesia: Choice  Hemostasis: * No tourniquets in log * EBL: 50 cc Materials: 4 x 4 gauze, Kerlix Ace bandage. Injectables: Half percent Marcaine plain 10 cc Complications: None  Indications for surgery: A 65 y.o. female presents with left foot abscess with concern for osteomyelitis of the left foot sesamoid and base of the proximal family as well as the fifth metatarsal head/toe. Patient has failed all conservative therapy including but not limited to local wound care and IV antibiotic. She wishes to have surgical correction of the foot/deformity. It was determined that patient would benefit from left foot incision and drainage of hallux and fifth metatarsal and excision of the sesamoidal complex with bone biopsy of first ray and fifth metatarsal ray. Informed  surgical risk consent was reviewed and read aloud to the patient.  I reviewed the films.  I have discussed my findings with the patient in great detail.  I have discussed all risks including but not limited to infection, stiffness, scarring, limp, disability, deformity, damage to blood vessels and nerves, numbness, poor healing, need for braces, arthritis, chronic pain, amputation, death.  All benefits and realistic expectations discussed in great detail.  I have made no promises as to the outcome.  I have provided realistic expectations.  I have offered the patient a 2nd opinion, which they have declined and assured me they preferred to proceed despite the risks   Procedure in detail: The patient was both verbally and visually identified by myself, the nursing staff, and anesthesia staff in the preoperative holding area. They were then transferred to the operating room and placed on the operative table in supine position.  Attention was directed to the left plantar wound of the hallux, a 15 blade was used to make an incision down to the level of the deep tissue.  Followed by use of hemostats to decompress all the purulent material.  At this time it was important note the sesamoid was involved.  Sesamoidectomy was performed in standard technique.  This was sent to pathology in standard manner as dirty bone.  No further purulent drainage was noted.  3 L of saline solution was utilized to irrigate thoroughly the wound.  At this time this will be packed open and his plan of a staged procedure and will come back on Saturday to delay primary closure versus surgical amputation based on culture  Attention was directed to the left lateral fifth metatarsal, using 15 blade incision was used to make down to the level of the deep tissue.  No purulent drainage was noted.  The wound was irrigated with 3 L of saline solution with pulse lavage thoroughly.  It is important note that the bone was soft and friable to touch.   At this time bone biopsy was obtained using using Jamshidi in standard technique of both the base of the fifth metatarsal as well as the head of the fifth metatarsal.  The cultures were sent to pathology in standard technique in sterile manner.  At this time the wounds were packed with Betadine 4 x 4 gauze, Kerlix and Ace bandage.  This is part of a staged procedure.  I will return back to the operating room on Saturday for delayed primary closure versus amputation.  At the conclusion of the procedure the patient was awoken from anesthesia and found to have tolerated the procedure well any complications. There were transferred to PACU with vital signs stable and vascular status intact.  Boneta Lucks, DPM

## 2021-01-31 NOTE — Anesthesia Procedure Notes (Signed)
Procedure Name: LMA Insertion Date/Time: 01/31/2021 12:22 PM Performed by: Darletta Moll, CRNA Pre-anesthesia Checklist: Patient identified, Emergency Drugs available, Suction available and Patient being monitored Patient Re-evaluated:Patient Re-evaluated prior to induction Oxygen Delivery Method: Circle system utilized Preoxygenation: Pre-oxygenation with 100% oxygen Induction Type: IV induction Ventilation: Mask ventilation without difficulty LMA: LMA inserted LMA Size: 4.0 Number of attempts: 1 Placement Confirmation: positive ETCO2 and breath sounds checked- equal and bilateral Tube secured with: Tape Dental Injury: Teeth and Oropharynx as per pre-operative assessment

## 2021-02-01 ENCOUNTER — Encounter (HOSPITAL_COMMUNITY): Payer: Self-pay | Admitting: Podiatry

## 2021-02-01 LAB — CBC WITH DIFFERENTIAL/PLATELET
Abs Immature Granulocytes: 0.03 10*3/uL (ref 0.00–0.07)
Basophils Absolute: 0 10*3/uL (ref 0.0–0.1)
Basophils Relative: 0 %
Eosinophils Absolute: 0 10*3/uL (ref 0.0–0.5)
Eosinophils Relative: 0 %
HCT: 33.2 % — ABNORMAL LOW (ref 36.0–46.0)
Hemoglobin: 10.9 g/dL — ABNORMAL LOW (ref 12.0–15.0)
Immature Granulocytes: 0 %
Lymphocytes Relative: 11 %
Lymphs Abs: 1 10*3/uL (ref 0.7–4.0)
MCH: 31.5 pg (ref 26.0–34.0)
MCHC: 32.8 g/dL (ref 30.0–36.0)
MCV: 96 fL (ref 80.0–100.0)
Monocytes Absolute: 0.5 10*3/uL (ref 0.1–1.0)
Monocytes Relative: 6 %
Neutro Abs: 7.2 10*3/uL (ref 1.7–7.7)
Neutrophils Relative %: 83 %
Platelets: 207 10*3/uL (ref 150–400)
RBC: 3.46 MIL/uL — ABNORMAL LOW (ref 3.87–5.11)
RDW: 14 % (ref 11.5–15.5)
WBC: 8.7 10*3/uL (ref 4.0–10.5)
nRBC: 0 % (ref 0.0–0.2)

## 2021-02-01 LAB — RENAL FUNCTION PANEL
Albumin: 2.9 g/dL — ABNORMAL LOW (ref 3.5–5.0)
Anion gap: 12 (ref 5–15)
BUN: 42 mg/dL — ABNORMAL HIGH (ref 8–23)
CO2: 26 mmol/L (ref 22–32)
Calcium: 8.9 mg/dL (ref 8.9–10.3)
Chloride: 99 mmol/L (ref 98–111)
Creatinine, Ser: 2.13 mg/dL — ABNORMAL HIGH (ref 0.44–1.00)
GFR, Estimated: 25 mL/min — ABNORMAL LOW (ref >60)
Glucose, Bld: 189 mg/dL — ABNORMAL HIGH (ref 70–99)
Phosphorus: 3.9 mg/dL (ref 2.5–4.6)
Potassium: 4.7 mmol/L (ref 3.5–5.1)
Sodium: 137 mmol/L (ref 135–145)

## 2021-02-01 LAB — GLUCOSE, CAPILLARY
Glucose-Capillary: 178 mg/dL — ABNORMAL HIGH (ref 70–99)
Glucose-Capillary: 207 mg/dL — ABNORMAL HIGH (ref 70–99)
Glucose-Capillary: 191 mg/dL — ABNORMAL HIGH (ref 70–99)
Glucose-Capillary: 188 mg/dL — ABNORMAL HIGH (ref 70–99)

## 2021-02-01 MED ORDER — TRAMADOL HCL 50 MG PO TABS
50.0000 mg | ORAL_TABLET | Freq: Every day | ORAL | Status: DC
  Administered 2021-02-01 – 2021-02-05 (×5): 50 mg via ORAL
  Filled 2021-02-01 (×5): qty 1

## 2021-02-01 MED ORDER — GABAPENTIN 400 MG PO CAPS
400.0000 mg | ORAL_CAPSULE | Freq: Two times a day (BID) | ORAL | Status: DC
  Administered 2021-02-01 – 2021-02-06 (×8): 400 mg via ORAL
  Filled 2021-02-01 (×8): qty 1

## 2021-02-01 MED ORDER — SODIUM CHLORIDE 0.9 % IV SOLN: INTRAVENOUS | Status: DC

## 2021-02-01 NOTE — H&P (View-Only) (Signed)
   VASCULAR SURGERY ASSESSMENT & PLAN:   PERIPHERAL VASCULAR DISEASE WITH LEFT FOOT WOUND: The patient underwent incision and drainage of the left foot with debridement and bone biopsy yesterday by Dr. Posey Pronto.  She is scheduled for an arteriogram tomorrow with possible angioplasty and stenting.  Given her chronic kidney disease we will use CO2 with limited contrast.  Her creatinine has slowly been coming down.  Would continue to use gentle hydration.  I have discussed with her the indication for the procedure and the potential complications and she is agreeable to proceed.  She is on aspirin, Plavix, and a statin.  I have written preop orders.   SUBJECTIVE:   No specific complaints this morning.  PHYSICAL EXAM:   Vitals:   01/31/21 1354 01/31/21 1830 02/01/21 0005 02/01/21 0614  BP: (!) 118/53 (!) 125/53 (!) 134/58 134/60  Pulse: (!) 50 62 61 (!) 59  Resp: 16 18 18 18   Temp: 97.8 F (36.6 C) 98.2 F (36.8 C) 98.4 F (36.9 C) 98.6 F (37 C)  TempSrc: Oral Oral Oral Oral  SpO2:  92% 100% 98%  Weight:      Height:       Dressing on left foot with minimal drainage.  LABS:   Lab Results  Component Value Date   WBC 8.7 02/01/2021   HGB 10.9 (L) 02/01/2021   HCT 33.2 (L) 02/01/2021   MCV 96.0 02/01/2021   PLT 207 02/01/2021   Lab Results  Component Value Date   CREATININE 2.13 (H) 02/01/2021   Lab Results  Component Value Date   INR 1.2 11/28/2020   CBG (last 3)  Recent Labs    01/31/21 1607 01/31/21 2215 02/01/21 0545  GLUCAP 226* 304* 178*    PROBLEM LIST:    Principal Problem:   Cellulitis of left foot Active Problems:   Chronic systolic congestive heart failure (HCC)   Diabetic peripheral neuropathy associated with type 2 diabetes mellitus (HCC)   Essential hypertension, benign   DM type 2, uncontrolled, with renal complications (HCC)   Diabetic foot ulcer (HCC)   CKD (chronic kidney disease) stage 4, GFR 15-29 ml/min (HCC)   AICD (automatic  cardioverter/defibrillator) present   CURRENT MEDS:   . amiodarone  200 mg Oral q morning  . aspirin EC  81 mg Oral Daily  . atorvastatin  10 mg Oral Daily  . carvedilol  3.125 mg Oral Daily  . clopidogrel  75 mg Oral Daily  . DULoxetine  30 mg Oral QHS  . empagliflozin  10 mg Oral QAC breakfast  . ferrous sulfate  325 mg Oral Q breakfast  . gabapentin  300 mg Oral BID  . heparin  5,000 Units Subcutaneous Q8H  . insulin aspart  0-20 Units Subcutaneous TID WC  . insulin aspart  0-5 Units Subcutaneous QHS  . insulin glargine  22 Units Subcutaneous QHS  . isosorbide mononitrate  30 mg Oral Daily  . levothyroxine  50 mcg Oral Q0600  . losartan  12.5 mg Oral q morning  . torsemide  20 mg Oral BID    Deitra Mayo Office: [phone] 02/01/2021

## 2021-02-01 NOTE — Progress Notes (Addendum)
   VASCULAR SURGERY ASSESSMENT & PLAN:   PERIPHERAL VASCULAR DISEASE WITH LEFT FOOT WOUND: The patient underwent incision and drainage of the left foot with debridement and bone biopsy yesterday by Dr. Posey Pronto.  She is scheduled for an arteriogram tomorrow with possible angioplasty and stenting.  Given her chronic kidney disease we will use CO2 with limited contrast.  Her creatinine has slowly been coming down.  Would continue to use gentle hydration.  I have discussed with her the indication for the procedure and the potential complications and she is agreeable to proceed.  She is on aspirin, Plavix, and a statin.  I have written preop orders.   SUBJECTIVE:   No specific complaints this morning.  PHYSICAL EXAM:   Vitals:   01/31/21 1354 01/31/21 1830 02/01/21 0005 02/01/21 0614  BP: (!) 118/53 (!) 125/53 (!) 134/58 134/60  Pulse: (!) 50 62 61 (!) 59  Resp: 16 18 18 18   Temp: 97.8 F (36.6 C) 98.2 F (36.8 C) 98.4 F (36.9 C) 98.6 F (37 C)  TempSrc: Oral Oral Oral Oral  SpO2:  92% 100% 98%  Weight:      Height:       Dressing on left foot with minimal drainage.  LABS:   Lab Results  Component Value Date   WBC 8.7 02/01/2021   HGB 10.9 (L) 02/01/2021   HCT 33.2 (L) 02/01/2021   MCV 96.0 02/01/2021   PLT 207 02/01/2021   Lab Results  Component Value Date   CREATININE 2.13 (H) 02/01/2021   Lab Results  Component Value Date   INR 1.2 11/28/2020   CBG (last 3)  Recent Labs    01/31/21 1607 01/31/21 2215 02/01/21 0545  GLUCAP 226* 304* 178*    PROBLEM LIST:    Principal Problem:   Cellulitis of left foot Active Problems:   Chronic systolic congestive heart failure (HCC)   Diabetic peripheral neuropathy associated with type 2 diabetes mellitus (HCC)   Essential hypertension, benign   DM type 2, uncontrolled, with renal complications (HCC)   Diabetic foot ulcer (HCC)   CKD (chronic kidney disease) stage 4, GFR 15-29 ml/min (HCC)   AICD (automatic  cardioverter/defibrillator) present   CURRENT MEDS:   . amiodarone  200 mg Oral q morning  . aspirin EC  81 mg Oral Daily  . atorvastatin  10 mg Oral Daily  . carvedilol  3.125 mg Oral Daily  . clopidogrel  75 mg Oral Daily  . DULoxetine  30 mg Oral QHS  . empagliflozin  10 mg Oral QAC breakfast  . ferrous sulfate  325 mg Oral Q breakfast  . gabapentin  300 mg Oral BID  . heparin  5,000 Units Subcutaneous Q8H  . insulin aspart  0-20 Units Subcutaneous TID WC  . insulin aspart  0-5 Units Subcutaneous QHS  . insulin glargine  22 Units Subcutaneous QHS  . isosorbide mononitrate  30 mg Oral Daily  . levothyroxine  50 mcg Oral Q0600  . losartan  12.5 mg Oral q morning  . torsemide  20 mg Oral BID    Deitra Mayo Office: [phone] 02/01/2021

## 2021-02-01 NOTE — Progress Notes (Signed)
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 Doren Custard  Hospital-Problem based course Osteomyelitis left foot wound Failed outpatient management on clindamycin-appreciate podiatry input-for surgery later on 1/27 At this time continue cefepime vancomycin Follow deep wound cultures and or recommendations from podiatrist--PT to evaluate -orders discussed with Dr. Posey Pronto re: wght bearing CO2 angiogram planned for 4/29 by vascular surgeon NICM--removal of ICD's for bacteremia MSSA in the past Blood culture negative X2 from 4/24 Has pacemaker in place currently hold further work-up  unless spikes fever, becomes bacteremic Continuing losartan 12.5 Imdur 30 Coreg 3.125 Demadex cut back to 20 mg twice daily PAD status post lower extremity interventions in the past ABIs as above Further management as per vascular surgeon Atrial fibrillation CHADS2 score >4 on amiodarone Continue amiodarone 200 at bedtime Continue Plavix 75, aspirin 81 and hold as per instructions from Dr. Doren Custard OSA on CPAP Resume cpap DM TY 2 with neuropathy  nephropathy and CKD 4 Monitor on ARB Sugars 191-207  eating 100% of meals BMI 41 Requires further outpatient discussion of the same   DVT prophylaxis: Heparin Code Status: Full Family Communication: none present Disposition:  Status is: Inpatient  Remains inpatient appropriate because:Hemodynamically unstable, Persistent severe electrolyte disturbances, Altered mental status and Ongoing diagnostic testing needed not appropriate for outpatient work up   Dispo: The patient is from: Home              Anticipated d/c is to: To be determined              Patient currently is not medically stable to d/c.   Difficult to place patient No   Consultants:   Podiatrist  Vascular surgeon  Procedures:   Antimicrobials:  Vancomycin and cefepime since 4/24   Subjective:  Well Pain manageable to some degree  no fever evetns noted overnight with bleeding to the dressing  Objective: Vitals:   02/01/21 0005 02/01/21 0614 02/01/21 1226 02/01/21 1635  BP: (!) 134/58 134/60 134/64 (!) 112/52  Pulse: 61 (!) 59 (!) 59 (!) 54  Resp: 18 18 18 18   Temp: 98.4 F (36.9 C) 98.6 F (37 C) 98.4 F (36.9 C) 98.4 F (36.9 C)  TempSrc: Oral Oral Oral Oral  SpO2: 100% 98% 99% 99%  Weight:      Height:        Intake/Output Summary (Last 24 hours) at 02/01/2021 1727 Last data filed at 02/01/2021 1500 Gross per 24 hour  Intake 558 ml  Output 1500 ml  Net -942 ml   Filed Weights   01/28/21 1941  Weight: 122.7 kg    Examination:  Mallampati 4 no icterus no pallor S1-S2 no murmur sinus rhythm on monitors Pacemaker in place over  left chest S1-S2 no murmur Abdomen soft no rebound no guarding Chest clear no added sound no rales Dressing has some mild blood tinge at bottom  Data Reviewed: personally reviewed   Bun/creat 42/2.13 Hemoglobin 10.9, WBC 8.7, PLT 207  Radiology Studies: No results found.   Scheduled Meds: . amiodarone  200 mg Oral q morning  . aspirin EC  81 mg  Oral Daily  . atorvastatin  10 mg Oral Daily  . carvedilol  3.125 mg Oral Daily  . clopidogrel  75 mg Oral Daily  . DULoxetine  30 mg Oral QHS  . ferrous sulfate  325 mg Oral Q breakfast  . gabapentin  400 mg Oral BID  . heparin  5,000 Units Subcutaneous Q8H  . insulin aspart  0-20 Units Subcutaneous TID WC  . insulin aspart  0-5 Units Subcutaneous QHS  . insulin glargine  22 Units Subcutaneous QHS  . isosorbide mononitrate  30 mg Oral Daily  . levothyroxine  50 mcg Oral Q0600  . losartan  12.5 mg Oral q morning  . torsemide  20 mg Oral BID  . traMADol  50 mg Oral QHS   Continuous Infusions: . [START ON 02/02/2021] sodium chloride    . ceFEPime (MAXIPIME) IV 2 g (02/01/21 0942)  . vancomycin 1,500 mg (01/30/21 2033)     LOS: 4 days   Time spent: 24 minutes  Nita Sells, MD [facility] To contact the attending provider between 7A-7P or the covering provider during after hours 7P-7A, please log into the web site [email or web address] and access using universal [place] password for that web site. If you do not have the password, please call the hospital operator.  02/01/2021, 5:27 PM

## 2021-02-01 NOTE — Care Management Important Message (Signed)
Important Message  Patient Details  Name: [name] MRN: [id number] Date of Birth: 07-13-1956   Medicare Important Message Given:  Yes     Crickett Abbett P [place] 02/01/2021, 2:20 PM

## 2021-02-02 ENCOUNTER — Inpatient Hospital Stay (HOSPITAL_COMMUNITY)
Admission: EM | Disposition: A | Discharge status: Home Health | Payer: Self-pay | Source: Home / Self Care | Attending: Family Medicine

## 2021-02-02 HISTORY — PX: ABDOMINAL AORTOGRAM W/LOWER EXTREMITY: CATH118223

## 2021-02-02 LAB — COMPREHENSIVE METABOLIC PANEL
ALT: 21 U/L (ref 0–44)
AST: 22 U/L (ref 15–41)
Albumin: 2.7 g/dL — ABNORMAL LOW (ref 3.5–5.0)
Alkaline Phosphatase: 53 U/L (ref 38–126)
Anion gap: 10 (ref 5–15)
BUN: 47 mg/dL — ABNORMAL HIGH (ref 8–23)
CO2: 28 mmol/L (ref 22–32)
Calcium: 8.7 mg/dL — ABNORMAL LOW (ref 8.9–10.3)
Chloride: 99 mmol/L (ref 98–111)
Creatinine, Ser: 2.21 mg/dL — ABNORMAL HIGH (ref 0.44–1.00)
GFR, Estimated: 24 mL/min — ABNORMAL LOW (ref >60)
Glucose, Bld: 204 mg/dL — ABNORMAL HIGH (ref 70–99)
Potassium: 4.1 mmol/L (ref 3.5–5.1)
Sodium: 137 mmol/L (ref 135–145)
Total Bilirubin: 0.6 mg/dL (ref 0.3–1.2)
Total Protein: 6.4 g/dL — ABNORMAL LOW (ref 6.5–8.1)

## 2021-02-02 LAB — GLUCOSE, CAPILLARY
Glucose-Capillary: 224 mg/dL — ABNORMAL HIGH (ref 70–99)
Glucose-Capillary: 142 mg/dL — ABNORMAL HIGH (ref 70–99)
Glucose-Capillary: 143 mg/dL — ABNORMAL HIGH (ref 70–99)
Glucose-Capillary: 169 mg/dL — ABNORMAL HIGH (ref 70–99)

## 2021-02-02 LAB — CULTURE, BLOOD (ROUTINE X 2)
Culture: NO GROWTH
Culture: NO GROWTH
Special Requests: ADEQUATE
Special Requests: ADEQUATE

## 2021-02-02 LAB — CBC WITH DIFFERENTIAL/PLATELET
Abs Immature Granulocytes: 0.05 10*3/uL (ref 0.00–0.07)
Basophils Absolute: 0 10*3/uL (ref 0.0–0.1)
Basophils Relative: 1 %
Eosinophils Absolute: 0.1 10*3/uL (ref 0.0–0.5)
Eosinophils Relative: 2 %
HCT: 31.9 % — ABNORMAL LOW (ref 36.0–46.0)
Hemoglobin: 10.5 g/dL — ABNORMAL LOW (ref 12.0–15.0)
Immature Granulocytes: 1 %
Lymphocytes Relative: 25 %
Lymphs Abs: 1.7 10*3/uL (ref 0.7–4.0)
MCH: 31.3 pg (ref 26.0–34.0)
MCHC: 32.9 g/dL (ref 30.0–36.0)
MCV: 95.2 fL (ref 80.0–100.0)
Monocytes Absolute: 0.5 10*3/uL (ref 0.1–1.0)
Monocytes Relative: 7 %
Neutro Abs: 4.5 10*3/uL (ref 1.7–7.7)
Neutrophils Relative %: 64 %
Platelets: 176 10*3/uL (ref 150–400)
RBC: 3.35 MIL/uL — ABNORMAL LOW (ref 3.87–5.11)
RDW: 14 % (ref 11.5–15.5)
WBC: 6.9 10*3/uL (ref 4.0–10.5)
nRBC: 0 % (ref 0.0–0.2)

## 2021-02-02 LAB — POCT ACTIVATED CLOTTING TIME
Activated Clotting Time: 321 seconds
Activated Clotting Time: 214 seconds
Activated Clotting Time: 190 seconds

## 2021-02-02 SURGERY — ABDOMINAL AORTOGRAM W/LOWER EXTREMITY
Anesthesia: LOCAL

## 2021-02-02 MED ORDER — HEPARIN (PORCINE) IN NACL 1000-0.9 UT/500ML-% IV SOLN
INTRAVENOUS | Status: DC | PRN
  Administered 2021-02-02 (×2): 500 mL

## 2021-02-02 MED ORDER — LABETALOL HCL 5 MG/ML IV SOLN: 10.0000 mg | INTRAVENOUS | Status: DC | PRN

## 2021-02-02 MED ORDER — FENTANYL CITRATE (PF) 100 MCG/2ML IJ SOLN
INTRAMUSCULAR | Status: DC | PRN
  Administered 2021-02-02: 50 ug via INTRAVENOUS

## 2021-02-02 MED ORDER — HEPARIN (PORCINE) IN NACL 1000-0.9 UT/500ML-% IV SOLN
INTRAVENOUS | Status: AC
  Filled 2021-02-02: qty 1000

## 2021-02-02 MED ORDER — CLOPIDOGREL BISULFATE 300 MG PO TABS
ORAL_TABLET | ORAL | Status: DC | PRN
  Administered 2021-02-02: 75 mg via ORAL

## 2021-02-02 MED ORDER — HEPARIN SODIUM (PORCINE) 1000 UNIT/ML IJ SOLN
INTRAMUSCULAR | Status: AC
  Filled 2021-02-02: qty 1

## 2021-02-02 MED ORDER — SODIUM CHLORIDE 0.9 % IV SOLN: 250.0000 mL | INTRAVENOUS | Status: DC | PRN

## 2021-02-02 MED ORDER — LIDOCAINE HCL (PF) 1 % IJ SOLN
INTRAMUSCULAR | Status: DC | PRN
  Administered 2021-02-02: 18 mL via INTRADERMAL

## 2021-02-02 MED ORDER — IODIXANOL 320 MG/ML IV SOLN
INTRAVENOUS | Status: DC | PRN
  Administered 2021-02-02: 10 mL via INTRA_ARTERIAL

## 2021-02-02 MED ORDER — ONDANSETRON HCL 4 MG/2ML IJ SOLN
4.0000 mg | Freq: Four times a day (QID) | INTRAMUSCULAR | Status: DC | PRN
Start: 2021-02-02 — End: 2021-02-06

## 2021-02-02 MED ORDER — ACETAMINOPHEN 325 MG PO TABS: 650.0000 mg | ORAL_TABLET | ORAL | Status: DC | PRN

## 2021-02-02 MED ORDER — CLOPIDOGREL BISULFATE 75 MG PO TABS
ORAL_TABLET | ORAL | Status: AC
  Filled 2021-02-02: qty 1

## 2021-02-02 MED ORDER — MIDAZOLAM HCL 2 MG/2ML IJ SOLN
INTRAMUSCULAR | Status: DC | PRN
  Administered 2021-02-02: 1 mg via INTRAVENOUS

## 2021-02-02 MED ORDER — HYDRALAZINE HCL 20 MG/ML IJ SOLN
5.0000 mg | INTRAMUSCULAR | Status: AC | PRN
Start: 2021-02-02 — End: 2021-02-02
  Administered 2021-02-02 (×2): 5 mg via INTRAVENOUS

## 2021-02-02 MED ORDER — LIDOCAINE HCL (PF) 1 % IJ SOLN
INTRAMUSCULAR | Status: AC
  Filled 2021-02-02: qty 30

## 2021-02-02 MED ORDER — OXYCODONE-ACETAMINOPHEN 5-325 MG PO TABS
1.0000 | ORAL_TABLET | ORAL | Status: DC | PRN
Start: 2021-02-02 — End: 2021-02-06
  Administered 2021-02-02 – 2021-02-03 (×2): 2 via ORAL
  Filled 2021-02-02 (×2): qty 2

## 2021-02-02 MED ORDER — HEPARIN SODIUM (PORCINE) 5000 UNIT/ML IJ SOLN
5000.0000 [IU] | Freq: Three times a day (TID) | INTRAMUSCULAR | Status: DC
  Administered 2021-02-02 – 2021-02-06 (×11): 5000 [IU] via SUBCUTANEOUS
  Filled 2021-02-02 (×10): qty 1

## 2021-02-02 MED ORDER — SODIUM CHLORIDE 0.9 % WEIGHT BASED INFUSION
1.0000 mL/kg/h | INTRAVENOUS | Status: DC
  Administered 2021-02-02: 1 mL/kg/h via INTRAVENOUS

## 2021-02-02 MED ORDER — HYDRALAZINE HCL 20 MG/ML IJ SOLN
INTRAMUSCULAR | Status: AC
  Filled 2021-02-02: qty 1

## 2021-02-02 MED ORDER — SODIUM CHLORIDE 0.9% FLUSH
3.0000 mL | INTRAVENOUS | Status: DC | PRN
  Administered 2021-02-03: 3 mL via INTRAVENOUS

## 2021-02-02 MED ORDER — SODIUM CHLORIDE 0.9% FLUSH
3.0000 mL | Freq: Two times a day (BID) | INTRAVENOUS | Status: DC
  Administered 2021-02-03 – 2021-02-05 (×5): 3 mL via INTRAVENOUS

## 2021-02-02 MED ORDER — MIDAZOLAM HCL 2 MG/2ML IJ SOLN
INTRAMUSCULAR | Status: AC
  Filled 2021-02-02: qty 2

## 2021-02-02 MED ORDER — HEPARIN SODIUM (PORCINE) 1000 UNIT/ML IJ SOLN
INTRAMUSCULAR | Status: DC | PRN
  Administered 2021-02-02: 12000 [IU] via INTRAVENOUS

## 2021-02-02 MED ORDER — FENTANYL CITRATE (PF) 100 MCG/2ML IJ SOLN
INTRAMUSCULAR | Status: AC
  Filled 2021-02-02: qty 2

## 2021-02-02 SURGICAL SUPPLY — 25 items
BALLN STERLING OTW 4X20X135 (BALLOONS) ×2
BALLN STERLING OTW 5X20X135 (BALLOONS) ×2
BALLOON STERLING OTW 4X20X135 (BALLOONS) IMPLANT
BALLOON STERLING OTW 5X20X135 (BALLOONS) IMPLANT
CATH ANGIO 5F PIGTAIL 65CM (CATHETERS) ×1 IMPLANT
CATH CROSS OVER TEMPO 5F (CATHETERS) ×1 IMPLANT
CATH STRAIGHT 5FR 65CM (CATHETERS) ×1 IMPLANT
CATH TEMPO AQUA 5F 100CM (CATHETERS) ×1 IMPLANT
FILTER CO2 0.2 MICRON (VASCULAR PRODUCTS) ×1 IMPLANT
GUIDEWIRE ANGLED .035X150CM (WIRE) ×1 IMPLANT
KIT ENCORE 26 ADVANTAGE (KITS) ×1 IMPLANT
KIT MICROPUNCTURE NIT STIFF (SHEATH) ×1 IMPLANT
KIT PV (KITS) ×2 IMPLANT
RESERVOIR CO2 (VASCULAR PRODUCTS) ×1 IMPLANT
SET FLUSH CO2 (MISCELLANEOUS) ×1 IMPLANT
SHEATH PINNACLE 5F 10CM (SHEATH) ×1 IMPLANT
SHEATH PINNACLE ST 6F 45CM (SHEATH) ×1 IMPLANT
SHEATH PROBE COVER 6X72 (BAG) ×1 IMPLANT
SYR MEDRAD MARK V 150ML (SYRINGE) ×1 IMPLANT
TRANSDUCER W/STOPCOCK (MISCELLANEOUS) ×2 IMPLANT
TRAY PV CATH (CUSTOM PROCEDURE TRAY) ×2 IMPLANT
WIRE G V18X300CM (WIRE) ×2 IMPLANT
WIRE HITORQ VERSACORE ST 145CM (WIRE) ×1 IMPLANT
WIRE ROSEN-J .035X180CM (WIRE) ×1 IMPLANT
WIRE VERSACORE LOC 115CM (WIRE) ×1 IMPLANT

## 2021-02-02 NOTE — Consult Note (Addendum)
  Subjective:  Patient ID: [name], female    DOB: 1956/08/08,  MRN: [id number]  A 66 y.o. female status post left foot incision and drainage with sesamoidectomy and multiple bone biopsy of first metatarsophalangeal joint and fifth metatarsophalangeal joint.  She states she is doing well she does not have any pain.  The bandages are clean dry and intact.  No strikethrough noted.  No calf pain noted.  No nausea fever chills vomiting. Objective:   Vitals:   02/02/21 1208 02/02/21 1403  BP: (!) 139/55   Pulse: (!) 54   Resp: 16   Temp: 98.2 F (36.8 C)   SpO2: 96% 99%   General AA&O x3. Normal mood and affect.  Vascular Dorsalis pedis and posterior tibial pulses nonpalpable Brisk capillary refill to all digits.  No pedal hair present.  Neurologic Epicritic sensation grossly intact.  Dermatologic  left foot bandages are clean dry and intact.  No calf pain noted.  Motor and sensory functions are intact.  Orthopedic: MMT 5/5 in dorsiflexion, plantarflexion, inversion, and eversion. Normal joint ROM without pain or crepitus.    IMPRESSION: 1. Soft tissue swelling and gas at the base of the first toe, increasing since prior study, likely representing expanding abscess. 2. No radiographic evidence of osteomyelitis. 3. Previous amputation of the second toe. Assessment & Plan:  Patient was evaluated and treated and all questions answered.  Left submetatarsal 1 ulceration abscess status post left foot incision drainage washout debridement with sesamoidectomy and bone biopsy of the first metatarsal head and fifth metatarsal head -All questions and concerns were addressed at bedside. -I will plan on returning back to the operating room for staged procedure for primary/delayed primary closure of the hallux wound as well as internal amputation of the left fifth metatarsophalangeal joint.  I discussed my surgical plan in extensive detail with the patient she states understanding would like  to proceed with the surgery. -She will be nonweightbearing to the left lower extremity afterwards. -Antibiotics per culture -She will follow 1 week from discharge -After 74-month surgery she would not need any dressing changes until follow-up.  Felipa Furnace, DPM  Accessible via secure chat for questions or concerns.

## 2021-02-02 NOTE — Progress Notes (Signed)
PT Cancellation Note  Patient Details Name: [name] MRN: [id number] DOB: April 11, 1956   Cancelled Treatment:    Reason Eval/Treat Not Completed: Other (comment).  I spoke with pt, obtained her PLOF.  She has not eaten since yesterday and is anxiously awaiting her procedure today.  Her preference is for me to check on her tomorrow (I am here), but warns she may be in surgery for her foot tomorrow (looks like she is at 7:15am per surgical schedule).  I will check on her in the afternoon tomorrow and see if she is ready to mobilize.    Thanks,  Verdene Lennert, PT, DPT  Acute Rehabilitation [phone] pager #[phone] office         Barbarann Ehlers Lynanne Delgreco 02/02/2021, 12:46 PM

## 2021-02-02 NOTE — Progress Notes (Signed)
Site area: Right groin a 6 french arterial long sheath was removed  Site Prior to Removal:  Level 0  Pressure Applied For 20 MINUTES    Bedrest Beginning at 1900p  Manual:   Yes.    Patient Status During Pull:  stable  Post Pull Groin Site:  Level 0  Post Pull Instructions Given:  Yes.    Post Pull Pulses Present:  Yes.    Dressing Applied:  Yes.    Comments:

## 2021-02-02 NOTE — Progress Notes (Signed)
Patient told me she was going for more surgery tomorrow. We have no orders for surgery. Dr. Posey Pronto called and left message to call me back. awaiting return call. Tim Camera operator aware.

## 2021-02-02 NOTE — Progress Notes (Signed)
Pharmacy Antibiotic Note  [name] is a 65 y.o. female admitted on 01/28/2021 with worsening left foot infection and possible osteomyelitis.  Pharmacy has been consulted for Vancomycin dosing. Also on Cefepime.  Was on Clindamycin PTA.  S/p I + D with arteriogram planned for today Scr slowly trending down Blood cultures negative   Plan: Continue Vancomycin 1500 mg IV Q 48 hrs.  (Goal AUC 400-550, Expected AUC: 468, SCr used: 2.21) Continue Cefepime 2 grams iv Q 12 hours  What is IV antibiotic LOT?   Height: 5\' 8"  (172.7 cm) Weight: 122.7 kg (270 lb 8.1 oz) IBW/kg (Calculated) : 63.9  Temp (24hrs), Avg:98.2 F (36.8 C), Min:97.7 F (36.5 C), Max:98.4 F (36.9 C)  Recent Labs  Lab 01/28/21 2007 01/28/21 2310 01/29/21 0624 01/30/21 0250 02/01/21 0440 02/02/21 0551  WBC  --  11.4* 10.1 7.9 8.7 6.9  CREATININE  --  2.75* 2.48* 2.21* 2.13* 2.21*  LATICACIDVEN 1.5 1.2  --   --   --   --     Estimated Creatinine Clearance: 34.5 mL/min (A) (by C-G formula based on SCr of 2.21 mg/dL (H)).    No Known Allergies  Thank you for allowing pharmacy to be a part of this patient's care. Anette Guarneri, PharmD  02/02/2021 8:52 AM

## 2021-02-02 NOTE — Op Note (Signed)
PATIENT: [name]      MRN: [id number] DOB: 1955-10-18    DATE OF PROCEDURE: 02/02/2021  INDICATIONS:    [name] is a 65 y.o. female who presented with a nonhealing wound of the left foot.  Patient is undergone previous angioplasty and stenting in the left lower extremity in 2019 at [facility].  She had dampened monophasic signals in the left foot and was felt to be at high risk for limb loss.  She presents for arteriography  PROCEDURE:    1.  Conscious sedation 2.  Ultrasound-guided access to the right common femoral artery 3.  CO2 aortogram 4.  Selective catheterization of the left external iliac artery with left lower extremity runoff using CO2 (second order catheterization) 5.  Selective catheterization of the left superficial femoral artery (third order catheterization) 6.  Angioplasty of 3 separate in-stent stenoses in the left superficial femoral artery.   SURGEON: Judeth Cornfield. Scot Dock, MD, FACS  ANESTHESIA: Local with sedation  EBL: Minimal  TECHNIQUE: The patient was brought to the peripheral vascular lab and was sedated. The period of conscious sedation was 99 minutes.  During that time period, I was present face-to-face 100% of the time.  The patient was administered 1 mg of Versed and 50 mcg of fentanyl. The patient's heart rate, blood pressure, and oxygen saturation were monitored by the nurse continuously during the procedure.  Both groins were prepped and draped in the usual sterile fashion.  Under ultrasound guidance, after the skin was anesthetized, I cannulated the right common femoral artery with a micropuncture needle and a micropuncture sheath was introduced over a wire.  This was exchanged for a 5 [place] sheath over a Bentson wire.  By ultrasound the femoral artery was patent. A real-time image was obtained and sent to the server.  The pigtail catheter was positioned the L1 vertebral body and flush aortogram obtained using  CO2.  The catheter was then positioned above the bifurcation and exchanged for a crossover catheter which was positioned into the left common iliac artery.  A wire was advanced into the external iliac artery and the crossover catheter exchanged for a straight catheter.  Selective left external iliac arteriogram was obtained with CO2 with left lower extremity runoff using staged images.  This demonstrated several areas of in-stent stenoses in her previously placed superficial femoral artery stents.  I elected to address these with angioplasty.  I advanced a Glidewire through the stents in the left leg and through the stenoses and then advanced a straight catheter over the wire.  I then placed a Rosen wire for support and exchanged the 5 French femoral sheath on the right for a 6 [place] destination sheath which was positioned into the left superficial femoral artery.  Further films were obtained which demonstrated an in-stent stenosis in the proximal stent in the proximal superficial femoral artery.  I dressed this with balloon angioplasty using a 4 mm x 2 cm balloon and then subsequently a 5 mm x 2 cm balloon with a good result.  There was a longer stent in the superficial femoral artery below that with the stenosis at the top of the stent and also at the distal aspect of the stent in the adductor canal.  This was addressed with a 4 mm x 2 cm balloon in both areas with a good result.  A total of 10 cc of contrast was used to study the tibial vessels and also to show the completion  resolved after the PTA of the in-stent stenoses  FINDINGS:   1.  Single renal arteries bilaterally with no significant renal artery stenosis.  The infrarenal aorta, bilateral common iliac arteries and bilateral external iliac arteries are patent. 2.  On the left side the common femoral and deep femoral artery are patent.  The superficial femoral artery is patent.  The proximal stent in the proximal superficial femoral artery had  an in-stent stenosis producing an approximately 60% narrowing which was addressed with angioplasty with minimal residual narrowing.  The longer SFA stent distal to this had a stenosis at the top of the stent producing an approximately 60% stenosis and an additional area of approximately 70% stenosis in the distal stent both of which were successfully addressed with angioplasty with minimal residual stenosis.  There is some narrowing in the popliteal artery below the popliteal stent.  I thought this would be associated with higher risk and given that she had excellent runoff with dominant runoff being the anterior tibial and posterior tibial artery I did not address this at this time.  CLINICAL NOTE: Overall I think she has reasonable circulation to the left foot and hopefully will have adequate circulation to heal her wound.  I I can follow her vascular issues as an outpatient.  From our standpoint she could be discharged.  She will remain on aspirin, Plavix, and a statin.     Vickie Mayo, MD, FACS [facility]  DATE OF DICTATION:   02/02/2021

## 2021-02-02 NOTE — Interval H&P Note (Signed)
History and Physical Interval Note:  02/02/2021 1:25 PM  [name]  has presented today for surgery, with the diagnosis of pvd with left foot wound.  The various methods of treatment have been discussed with the patient and family. After consideration of risks, benefits and other options for treatment, the patient has consented to  Procedure(s): ABDOMINAL AORTOGRAM W/LOWER EXTREMITY (N/A) as a surgical intervention.  The patient's history has been reviewed, patient examined, no change in status, stable for surgery.  I have reviewed the patient's chart and labs.  Questions were answered to the patient's satisfaction.     Deitra Mayo

## 2021-02-02 NOTE — Progress Notes (Signed)
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 Doren Custard  Hospital-Problem based course Osteomyelitis left foot wound Failed outpatient management on clindamycin-appreciate podiatry input-for surgery later on 1/27 Continue cefepime vancomycin BC x 2  --5 d. from admit neg from podiatrist--PT to evaluate  orders discussed with Dr. Posey Pronto re: wght bearing--defer to Dr. Posey Pronto further surgery? CO2 angiogram planned for 4/29 by vascular surgeon NICM--removal of ICD's for bacteremia MSSA in the past Blood culture negative X2 from 4/24 pacemaker in place currently hold further work-up  unless spikes fever, becomes bacteremic Continuing losartan 12.5 Imdur 30 Coreg 3.125 Demadex cut back to 20 mg twice daily PAD status post lower extremity interventions in the past ABIs as above Further management as per vascular surgeon Atrial fibrillation CHADS2 score >4 on amiodarone Continue amiodarone 200 at bedtime Continue Plavix 75, aspirin 81 and hold as per instructions from Dr. Doren Custard OSA on CPAP Resume cpap DM TY 2 AiC 8.1 with  neuropathy nephropathy and CKD 4 Npo for procedure-may resume diet post angio Monitor on ARB Sugars 178-224  BMI 41 Requires further outpatient discussion of the same   DVT prophylaxis: Heparin Code Status: Full Family Communication: none present Disposition:  Status is: Inpatient  Remains inpatient appropriate because:Hemodynamically unstable, Persistent severe electrolyte disturbances, Altered mental status and Ongoing diagnostic testing needed not appropriate for outpatient work up   Dispo: The patient is from: Home              Anticipated d/c is to: To be determined              Patient currently is not medically stable to d/c.   Difficult to place patient No   Consultants:   Podiatrist  Vascular surgeon  Procedures:   Antimicrobials:  Vancomycin and cefepime since 4/24   Subjective:  Sleepy but arousable this morning-n.p.o. for angiogram Denies pain No fever no chills noted   Objective: Vitals:   02/01/21 1226 02/01/21 1635 02/01/21 2300 02/02/21 0510  BP: 134/64 (!) 112/52 (!) 155/60 (!) 141/56  Pulse: (!) 59 (!) 54 (!) 55 (!) 55  Resp: 18 18 18 16   Temp: 98.4 F (36.9 C) 98.4 F (36.9 C) 97.7 F (36.5 C) 98.4 F (36.9 C)  TempSrc: Oral Oral Oral Oral  SpO2: 99% 99% 98% 95%  Weight:      Height:        Intake/Output Summary (Last 24 hours) at 02/02/2021 0903 Last data filed at 02/02/2021 0513 Gross per 24 hour  Intake 558 ml  Output 2350 ml  Net -1792 ml   Filed Weights   01/28/21 1941  Weight: 122.7 kg    Examination:  EOMI NCAT no focal deficit no icterus pallor  Chest clear no added sound rales rhonchi Abdomen obese nontender no rebound no guarding Left lower extremity wrapped in [place] with some tinged dried blood at baseblk fem first and second metatarsal-I did not disturb wound Neurologically intact moving all 4 limbs equally  Data Reviewed: personally reviewed   Bun/creat 42/2.13-->47/2.2 Hemoglobin 10.5,  WBC 8.7-->6.9-->  PLT 207-->176  Radiology Studies: No results found.   Scheduled Meds: . amiodarone  200 mg Oral q morning  . aspirin EC  81 mg Oral Daily  . atorvastatin  10 mg Oral Daily  . carvedilol  3.125 mg Oral Daily  . clopidogrel  75 mg Oral Daily  . DULoxetine  30 mg Oral QHS  . ferrous sulfate  325 mg Oral Q breakfast  . gabapentin  400 mg Oral BID  . heparin  5,000 Units Subcutaneous Q8H  . insulin aspart  0-20 Units Subcutaneous TID WC  . insulin aspart  0-5 Units Subcutaneous QHS  . insulin glargine  22 Units Subcutaneous QHS  . isosorbide mononitrate  30 mg Oral Daily  . levothyroxine  50 mcg Oral Q0600  . losartan  12.5 mg Oral q morning  . torsemide  20 mg Oral BID  . traMADol  50 mg Oral QHS   Continuous Infusions: . sodium chloride 100 mL/hr at 02/02/21 0038  . ceFEPime (MAXIPIME) IV 2 g (02/01/21 2249)  . vancomycin 1,500 mg (02/01/21 1959)     LOS: 5 days   Time spent: 14 minutes  Nita Sells, MD [facility] To contact the attending provider between 7A-7P or the covering provider during after hours 7P-7A, please log into the web site [email or web address] and access using universal [place] password for that web site. If you do not have the password, please call the hospital operator.  02/02/2021, 9:03 AM

## 2021-02-02 NOTE — Progress Notes (Signed)
Paged Dr. Posey Pronto again awaiting return call.

## 2021-02-03 ENCOUNTER — Inpatient Hospital Stay (HOSPITAL_COMMUNITY): Payer: HMO | Admitting: Certified Registered"

## 2021-02-03 ENCOUNTER — Encounter (HOSPITAL_COMMUNITY): Payer: Self-pay | Admitting: Family Medicine

## 2021-02-03 ENCOUNTER — Inpatient Hospital Stay (HOSPITAL_COMMUNITY): Payer: HMO

## 2021-02-03 ENCOUNTER — Encounter (HOSPITAL_COMMUNITY)
Admission: EM | Disposition: A | Discharge status: Home Health | Payer: Self-pay | Source: Home / Self Care | Attending: Family Medicine

## 2021-02-03 HISTORY — PX: I & D EXTREMITY: SHX5045

## 2021-02-03 LAB — RENAL FUNCTION PANEL
Albumin: 2.6 g/dL — ABNORMAL LOW (ref 3.5–5.0)
Anion gap: 11 (ref 5–15)
BUN: 41 mg/dL — ABNORMAL HIGH (ref 8–23)
CO2: 23 mmol/L (ref 22–32)
Calcium: 8.2 mg/dL — ABNORMAL LOW (ref 8.9–10.3)
Chloride: 104 mmol/L (ref 98–111)
Creatinine, Ser: 1.86 mg/dL — ABNORMAL HIGH (ref 0.44–1.00)
GFR, Estimated: 30 mL/min — ABNORMAL LOW (ref >60)
Glucose, Bld: 374 mg/dL — ABNORMAL HIGH (ref 70–99)
Phosphorus: 3.5 mg/dL (ref 2.5–4.6)
Potassium: 4.3 mmol/L (ref 3.5–5.1)
Sodium: 138 mmol/L (ref 135–145)

## 2021-02-03 LAB — BASIC METABOLIC PANEL
Anion gap: 10 (ref 5–15)
BUN: 40 mg/dL — ABNORMAL HIGH (ref 8–23)
CO2: 22 mmol/L (ref 22–32)
Calcium: 8.2 mg/dL — ABNORMAL LOW (ref 8.9–10.3)
Chloride: 105 mmol/L (ref 98–111)
Creatinine, Ser: 1.88 mg/dL — ABNORMAL HIGH (ref 0.44–1.00)
GFR, Estimated: 29 mL/min — ABNORMAL LOW (ref >60)
Glucose, Bld: 370 mg/dL — ABNORMAL HIGH (ref 70–99)
Potassium: 4.3 mmol/L (ref 3.5–5.1)
Sodium: 137 mmol/L (ref 135–145)

## 2021-02-03 LAB — GLUCOSE, CAPILLARY
Glucose-Capillary: 231 mg/dL — ABNORMAL HIGH (ref 70–99)
Glucose-Capillary: 84 mg/dL (ref 70–99)
Glucose-Capillary: 203 mg/dL — ABNORMAL HIGH (ref 70–99)
Glucose-Capillary: 360 mg/dL — ABNORMAL HIGH (ref 70–99)

## 2021-02-03 LAB — MRSA PCR SCREENING: MRSA by PCR: NEGATIVE

## 2021-02-03 SURGERY — IRRIGATION AND DEBRIDEMENT EXTREMITY
Anesthesia: General | Site: Foot | Laterality: Left

## 2021-02-03 MED ORDER — LIDOCAINE HCL 1 % IJ SOLN
INTRAMUSCULAR | Status: AC
  Filled 2021-02-03: qty 20

## 2021-02-03 MED ORDER — ONDANSETRON HCL 4 MG/2ML IJ SOLN
INTRAMUSCULAR | Status: DC | PRN
  Administered 2021-02-03: 4 mg via INTRAVENOUS

## 2021-02-03 MED ORDER — DEXAMETHASONE SODIUM PHOSPHATE 10 MG/ML IJ SOLN
INTRAMUSCULAR | Status: AC
  Filled 2021-02-03: qty 1

## 2021-02-03 MED ORDER — SUCCINYLCHOLINE CHLORIDE 200 MG/10ML IV SOSY
PREFILLED_SYRINGE | INTRAVENOUS | Status: AC
  Filled 2021-02-03: qty 10

## 2021-02-03 MED ORDER — VANCOMYCIN HCL 500 MG IV SOLR
INTRAVENOUS | Status: DC | PRN
  Administered 2021-02-03: 500 mg

## 2021-02-03 MED ORDER — BUPIVACAINE HCL (PF) 0.25 % IJ SOLN
INTRAMUSCULAR | Status: AC
  Filled 2021-02-03: qty 30

## 2021-02-03 MED ORDER — AMISULPRIDE (ANTIEMETIC) 5 MG/2ML IV SOLN: 10.0000 mg | Freq: Once | INTRAVENOUS | Status: DC | PRN

## 2021-02-03 MED ORDER — SODIUM CHLORIDE 0.9 % IR SOLN
Status: DC | PRN
  Administered 2021-02-03: 3000 mL

## 2021-02-03 MED ORDER — VANCOMYCIN HCL 500 MG IV SOLR
INTRAVENOUS | Status: AC
  Filled 2021-02-03: qty 500

## 2021-02-03 MED ORDER — MIDAZOLAM HCL 2 MG/2ML IJ SOLN
INTRAMUSCULAR | Status: AC
  Filled 2021-02-03: qty 2

## 2021-02-03 MED ORDER — ROCURONIUM BROMIDE 10 MG/ML (PF) SYRINGE
PREFILLED_SYRINGE | INTRAVENOUS | Status: AC
  Filled 2021-02-03: qty 10

## 2021-02-03 MED ORDER — FENTANYL CITRATE (PF) 100 MCG/2ML IJ SOLN: 25.0000 ug | INTRAMUSCULAR | Status: DC | PRN

## 2021-02-03 MED ORDER — CHLORHEXIDINE GLUCONATE 0.12 % MT SOLN
OROMUCOSAL | Status: AC
  Administered 2021-02-03: 15 mL via OROMUCOSAL
  Filled 2021-02-03: qty 15

## 2021-02-03 MED ORDER — EPINEPHRINE 1 MG/10ML IJ SOSY
PREFILLED_SYRINGE | INTRAMUSCULAR | Status: AC
  Filled 2021-02-03: qty 10

## 2021-02-03 MED ORDER — ONDANSETRON HCL 4 MG/2ML IJ SOLN: 4.0000 mg | Freq: Once | INTRAMUSCULAR | Status: DC | PRN

## 2021-02-03 MED ORDER — FENTANYL CITRATE (PF) 250 MCG/5ML IJ SOLN
INTRAMUSCULAR | Status: AC
  Filled 2021-02-03: qty 5

## 2021-02-03 MED ORDER — LIDOCAINE 2% (20 MG/ML) 5 ML SYRINGE
INTRAMUSCULAR | Status: AC
  Filled 2021-02-03: qty 5

## 2021-02-03 MED ORDER — LIDOCAINE 2% (20 MG/ML) 5 ML SYRINGE
INTRAMUSCULAR | Status: DC | PRN
  Administered 2021-02-03: 100 mg via INTRAVENOUS

## 2021-02-03 MED ORDER — PROPOFOL 10 MG/ML IV BOLUS
INTRAVENOUS | Status: DC | PRN
  Administered 2021-02-03: 100 mg via INTRAVENOUS

## 2021-02-03 MED ORDER — PROPOFOL 10 MG/ML IV BOLUS
INTRAVENOUS | Status: AC
  Filled 2021-02-03: qty 20

## 2021-02-03 MED ORDER — ACETAMINOPHEN 10 MG/ML IV SOLN: 1000.0000 mg | Freq: Once | INTRAVENOUS | Status: DC | PRN

## 2021-02-03 MED ORDER — MIDAZOLAM HCL 2 MG/2ML IJ SOLN
INTRAMUSCULAR | Status: DC | PRN
  Administered 2021-02-03: 2 mg via INTRAVENOUS

## 2021-02-03 MED ORDER — ONDANSETRON HCL 4 MG/2ML IJ SOLN
INTRAMUSCULAR | Status: AC
  Filled 2021-02-03: qty 4

## 2021-02-03 MED ORDER — 0.9 % SODIUM CHLORIDE (POUR BTL) OPTIME
TOPICAL | Status: DC | PRN
  Administered 2021-02-03: 1000 mL

## 2021-02-03 MED ORDER — CHLORHEXIDINE GLUCONATE 0.12 % MT SOLN: 15.0000 mL | Freq: Once | OROMUCOSAL | Status: AC

## 2021-02-03 MED ORDER — PHENYLEPHRINE 40 MCG/ML (10ML) SYRINGE FOR IV PUSH (FOR BLOOD PRESSURE SUPPORT)
PREFILLED_SYRINGE | INTRAVENOUS | Status: DC | PRN
  Administered 2021-02-03 (×3): 80 ug via INTRAVENOUS
  Administered 2021-02-03: 40 ug via INTRAVENOUS

## 2021-02-03 MED ORDER — PHENYLEPHRINE 40 MCG/ML (10ML) SYRINGE FOR IV PUSH (FOR BLOOD PRESSURE SUPPORT)
PREFILLED_SYRINGE | INTRAVENOUS | Status: AC
  Filled 2021-02-03: qty 10

## 2021-02-03 MED ORDER — ORAL CARE MOUTH RINSE
15.0000 mL | Freq: Once | OROMUCOSAL | Status: AC
Start: 2021-02-03 — End: 2021-02-03

## 2021-02-03 MED ORDER — DEXAMETHASONE SODIUM PHOSPHATE 10 MG/ML IJ SOLN
INTRAMUSCULAR | Status: DC | PRN
  Administered 2021-02-03: 5 mg via INTRAVENOUS

## 2021-02-03 MED ORDER — LACTATED RINGERS IV SOLN: INTRAVENOUS | Status: DC

## 2021-02-03 SURGICAL SUPPLY — 60 items
APL PRP STRL LF DISP 70% ISPRP (MISCELLANEOUS)
BANDAGE ESMARK 6X9 LF (GAUZE/BANDAGES/DRESSINGS) IMPLANT
BLADE AVERAGE 25X9 (BLADE) ×1 IMPLANT
BLADE SURG 15 STRL LF DISP TIS (BLADE) ×2 IMPLANT
BLADE SURG 15 STRL SS (BLADE) ×4
BNDG CMPR 9X6 STRL LF SNTH (GAUZE/BANDAGES/DRESSINGS)
BNDG ELASTIC 3X5.8 VLCR STR LF (GAUZE/BANDAGES/DRESSINGS) ×1 IMPLANT
BNDG ELASTIC 4X5.8 VLCR STR LF (GAUZE/BANDAGES/DRESSINGS) ×2 IMPLANT
BNDG ESMARK 6X9 LF (GAUZE/BANDAGES/DRESSINGS)
BNDG GAUZE ELAST 4 BULKY (GAUZE/BANDAGES/DRESSINGS) ×3 IMPLANT
CHLORAPREP W/TINT 26 (MISCELLANEOUS) ×1 IMPLANT
COVER BACK TABLE 60X90IN (DRAPES) ×1 IMPLANT
COVER WAND RF STERILE (DRAPES) IMPLANT
CUFF TOURN SGL QUICK 34 (TOURNIQUET CUFF)
CUFF TRNQT CYL 34X4.125X (TOURNIQUET CUFF) IMPLANT
DRAPE EXTREMITY T 121X128X90 (DISPOSABLE) ×1 IMPLANT
DRAPE IMP U-DRAPE 54X76 (DRAPES) ×2 IMPLANT
DRAPE OEC MINIVIEW 54X84 (DRAPES) IMPLANT
DRAPE SURG 17X23 STRL (DRAPES) IMPLANT
DRAPE U-SHAPE 47X51 STRL (DRAPES) ×1 IMPLANT
DRSG EMULSION OIL 3X3 NADH (GAUZE/BANDAGES/DRESSINGS) ×2 IMPLANT
DRSG PAD ABDOMINAL 8X10 ST (GAUZE/BANDAGES/DRESSINGS) ×1 IMPLANT
ELECT REM PT RETURN 9FT ADLT (ELECTROSURGICAL) ×2
ELECTRODE REM PT RTRN 9FT ADLT (ELECTROSURGICAL) ×1 IMPLANT
GAUZE 4X4 16PLY RFD (DISPOSABLE) IMPLANT
GAUZE SPONGE 4X4 12PLY STRL (GAUZE/BANDAGES/DRESSINGS) ×2 IMPLANT
GLOVE BIO SURGEON STRL SZ7 (GLOVE) ×2 IMPLANT
GLOVE BIOGEL PI IND STRL 7.5 (GLOVE) ×1 IMPLANT
GLOVE BIOGEL PI INDICATOR 7.5 (GLOVE) ×1
GOWN STRL REUS W/ TWL LRG LVL3 (GOWN DISPOSABLE) ×1 IMPLANT
GOWN STRL REUS W/ TWL XL LVL3 (GOWN DISPOSABLE) ×1 IMPLANT
GOWN STRL REUS W/TWL LRG LVL3 (GOWN DISPOSABLE) ×2
GOWN STRL REUS W/TWL XL LVL3 (GOWN DISPOSABLE) ×2
KIT BASIN OR (CUSTOM PROCEDURE TRAY) ×2 IMPLANT
MICROMATRIX 1000MG (Tissue) ×2 IMPLANT
NDL HYPO 25X1 1.5 SAFETY (NEEDLE) ×1 IMPLANT
NDL SAFETY ECLIPSE 18X1.5 (NEEDLE) IMPLANT
NEEDLE HYPO 18GX1.5 SHARP (NEEDLE) ×2
NEEDLE HYPO 25X1 1.5 SAFETY (NEEDLE) ×2 IMPLANT
NS IRRIG 1000ML POUR BTL (IV SOLUTION) ×1 IMPLANT
PADDING CAST ABS 4INX4YD NS (CAST SUPPLIES)
PADDING CAST ABS COTTON 4X4 ST (CAST SUPPLIES) ×2 IMPLANT
PENCIL SMOKE EVACUATOR (MISCELLANEOUS) ×2 IMPLANT
PULSAVAC PLUS IRRIG FAN TIP (DISPOSABLE) ×2
SOL PREP POV-IOD 4OZ 10% (MISCELLANEOUS) ×1 IMPLANT
SOL PREP PROV IODINE SCRUB 4OZ (MISCELLANEOUS) ×1 IMPLANT
SOLUTION PARTIC MCRMTRX 1000MG (Tissue) IMPLANT
STAPLER VISISTAT 35W (STAPLE) ×1 IMPLANT
STOCKINETTE 6  STRL (DRAPES) ×2
STOCKINETTE 6 STRL (DRAPES) ×1 IMPLANT
SUT MNCRL AB 3-0 PS2 18 (SUTURE) IMPLANT
SUT MNCRL AB 4-0 PS2 18 (SUTURE) IMPLANT
SUT MON AB 5-0 PS2 18 (SUTURE) IMPLANT
SUT PROLENE 3 0 PS 2 (SUTURE) ×2 IMPLANT
SUT PROLENE 4 0 PS 2 18 (SUTURE) ×2 IMPLANT
SYR BULB EAR ULCER 3OZ GRN STR (SYRINGE) ×2 IMPLANT
SYR CONTROL 10ML LL (SYRINGE) IMPLANT
TIP FAN IRRIG PULSAVAC PLUS (DISPOSABLE) IMPLANT
UNDERPAD 30X36 HEAVY ABSORB (UNDERPADS AND DIAPERS) ×2 IMPLANT
YANKAUER SUCT BULB TIP NO VENT (SUCTIONS) ×3 IMPLANT

## 2021-02-03 NOTE — Progress Notes (Signed)
PT Cancellation Note  Patient Details Name: [name] MRN: [id number] DOB: 10-23-1955   Cancelled Treatment:    Reason Eval/Treat Not Completed: Patient at procedure or test/unavailable as she is off unit for I&D this morning. PT will continue to follow after surgery and evaluate as appropriate.   Hardie Pulley, DPT   Acute Rehabilitation Department Pager #: [phone]   Otho Bellows 02/03/2021, 10:33 AM

## 2021-02-03 NOTE — Anesthesia Preprocedure Evaluation (Signed)
Anesthesia Evaluation  Patient identified by MRN, date of birth, ID band Patient awake    Reviewed: Allergy & Precautions, NPO status , Patient's Chart, lab work & pertinent test results, reviewed documented beta blocker date and time   Airway Mallampati: III  TM Distance: >3 FB Neck ROM: Full    Dental no notable dental hx. (+) Teeth Intact, Missing, Dental Advisory Given,    Pulmonary sleep apnea and Continuous Positive Airway Pressure Ventilation ,    Pulmonary exam normal breath sounds clear to auscultation       Cardiovascular hypertension, Pt. on medications and Pt. on home beta blockers + CAD, + Past MI, + Cardiac Stents and +CHF (grade 1 diastolic dysfunction)  Normal cardiovascular exam+ dysrhythmias Atrial Fibrillation + Cardiac Defibrillator + Valvular Problems/Murmurs (mild to mod MR) MR  Rhythm:Regular Rate:Normal  Last echo 03/2020: 1. Left ventricular ejection fraction, by estimation, is 50 to 55%. The  left ventricle has low normal function. The left ventricle has no regional  wall motion abnormalities. Left ventricular diastolic parameters are  consistent with Grade I diastolic  dysfunction (impaired relaxation).  2. Right ventricular systolic function is low normal.  3. Mild to moderate mitral valve regurgitation.    Last cath 2020 while on milrinone for HF: 1. Persistently elevated biventricular pressures with mildly reduced CO on milrinone 2. Evidence of severe MR and TR in tracings 3. Frequent polymorphic PVCs throughout case    Neuro/Psych PSYCHIATRIC DISORDERS Depression negative neurological ROS     GI/Hepatic negative GI ROS, Neg liver ROS,   Endo/Other  diabetes, Well Controlled, Type 2, Insulin Dependent, Oral Hypoglycemic AgentsHypothyroidism Morbid obesityBMI 41 FS 126  Renal/GU Renal Insufficiency and CRFRenal diseaseCKD 4, Cr 2.2     Musculoskeletal Abscess L foot   Abdominal (+) +  obese,   Peds  Hematology  (+) anemia , HLD   Anesthesia Other Findings abscess left foot  Reproductive/Obstetrics                             Anesthesia Physical  Anesthesia Plan  ASA: III  Anesthesia Plan: General   Post-op Pain Management:    Induction: Intravenous  PONV Risk Score and Plan: 3 and Ondansetron, Dexamethasone, Midazolam and Treatment may vary due to age or medical condition  Airway Management Planned: LMA  Additional Equipment:   Intra-op Plan:   Post-operative Plan: Extubation in OR  Informed Consent: I have reviewed the patients History and Physical, chart, labs and discussed the procedure including the risks, benefits and alternatives for the proposed anesthesia with the patient or authorized representative who has indicated his/her understanding and acceptance.     Dental advisory given  Plan Discussed with: CRNA  Anesthesia Plan Comments:         Anesthesia Quick Evaluation

## 2021-02-03 NOTE — Op Note (Signed)
Surgeon: Surgeon(s): Felipa Furnace, DPM  Assistants: Same Pre-operative diagnosis: abscess left foot and osteomyelitis Post-operative diagnosis: same Procedure:1.  Bone resection of the base of the proximal phalanx 2.  Delayed primary closure 3.  Bone resection of the fifth metatarsal head and base of the proximal phalanx of the fifth. Pathology:  ID Type Source Tests Collected by Time Destination  1 : Left Foot Tissue Dirty Tissue PATH Other SURGICAL PATHOLOGY Felipa Furnace, DPM 02/03/2021 1058     Pertinent Intra-op findings: All the soft friable bone was resected.  The margins did appear hard and indurated clinically.  However clean margins were sent to pathology and microbiology Anesthesia: General  Hemostasis: * No tourniquets in log * EBL: 20 mL  Materials: 3-0 Prolene Injectables: None Complications: None  Indications for surgery: A 65 y.o. female presents with left foot abscess and osteomyelitis returning back to the OR as part of a staged procedure. Patient has failed all conservative therapy including but not limited to IV antibiotics and local wound care. She wishes to have surgical correction of the foot/deformity. It was determined that patient would benefit from left foot internal amputation of the fifth as well as resection of the base of the proximal phalanx with delayed primary closure.. Informed surgical risk consent was reviewed and read aloud to the patient.  I reviewed the films.  I have discussed my findings with the patient in great detail.  I have discussed all risks including but not limited to infection, stiffness, scarring, limp, disability, deformity, damage to blood vessels and nerves, numbness, poor healing, need for braces, arthritis, chronic pain, amputation, death.  All benefits and realistic expectations discussed in great detail.  I have made no promises as to the outcome.  I have provided realistic expectations.  I have offered the patient a 2nd opinion,  which they have declined and assured me they preferred to proceed despite the risks   Procedure in detail: The patient was both verbally and visually identified by myself, the nursing staff, and anesthesia staff in the preoperative holding area. They were then transferred to the operating room and placed on the operative table in supine position.  Attention was directed to the left plantar foot at the previous incision, follow nonviable tissue was debrided down using a rongeur.  Good bleeding healthy tissue was noted at this time.  A sagittal saw was used to resect the base of the proximal phalanx given that there was a presence of osteomyelitis.  It was sent to pathology labeled dirty and standard technique it is important note that the bone was soft and friable.  It was resected down to healthy indurated bone.  Using pulse lavage and 3 L saline the wound was thoroughly irrigated down.  Clean margins were taken in standard technique and sent to both microbiology and pathology.  At this time the wound appeared clear of infection and can be primarily closed.  Using 3-0 Prolene the wound was primarily closed in simple interrupted suture technique.  She can be partial weightbearing to the heel.  Attention was directed to the left lateral fifth metatarsal incision.  At this time the bone was noted to be soft friable as well.  Internal amputation with bone resection of the head of the fifth metatarsal and base of the proximal phalanx of the fifth was accomplished using sagittal saw.  The bone was sent to pathology in the same specimen as above labeled as dirty.  All nonviable tissue was debrided.  Using 3 L saline solution the wound was thoroughly irrigated down. Clean margins were taken in standard technique and sent to both microbiology and pathology.  At this time the wound appeared clear of infection and can be primarily closed.  Using 3-0 Prolene the wound was primarily closed in simple interrupted suture  technique.  She can be partial weightbearing to the heel.  The foot was wrapped with Betadine wet-to-dry dressing, with Kerlix Ace bandage.  All bony prominences were adequately padded.  Patient can be discharged on p.o. antibiotics until culture speciation.  Should be partial weightbearing to the heel to the left side.   At the conclusion of the procedure the patient was awoken from anesthesia and found to have tolerated the procedure well any complications. There were transferred to PACU with vital signs stable and vascular status intact.  Boneta Lucks, DPM

## 2021-02-03 NOTE — Transfer of Care (Signed)
Immediate Anesthesia Transfer of Care Note  Patient: [name]  Procedure(s) Performed: IRRIGATION AND DEBRIDEMENT EXTREMITY WITH INTERNAL AMPUTATION FIRST AND FIFTH RAY (Left Foot)  Patient Location: PACU  Anesthesia Type:General  Level of Consciousness: drowsy, patient cooperative and responds to stimulation  Airway & Oxygen Therapy: Patient Spontanous Breathing  Post-op Assessment: Report given to RN and Post -op Vital signs reviewed and stable  Post vital signs: Reviewed and stable  Last Vitals:  Vitals Value Taken Time  BP 115/50 02/03/21 1115  Temp    Pulse 54 02/03/21 1116  Resp 10 02/03/21 1116  SpO2 98 % 02/03/21 1116  Vitals shown include unvalidated device data.  Last Pain:  Vitals:   02/03/21 0825  TempSrc:   PainSc: 0-No pain      Patients Stated Pain Goal: 0 (36/62/94 [id number])  Complications: No complications documented.

## 2021-02-03 NOTE — Evaluation (Signed)
Physical Therapy Evaluation Patient Details Name: [name] MRN: [id number] DOB: 12-13-1955 Today's Date: 02/03/2021   History of Present Illness  The pt is a 65 yo female presenting 4/24 due to non-healing wound on L foot after 3 weeks of antibiotics. Pt found to have dampened signals in the L foot and underwent arteriography with stant placement in superficial femoral artery on 4/29. I&D of L foot wound on 4/30. PMH includes: MI, osteomyelitis of foot and ankle, CAD, CKD, CHF, HTN, neuropathy, obesity, DM II, multiple toe amputations, and heart caths, and biventricular cardioverter defibrillator implantation.    Clinical Impression  Pt in bed upon arrival of PT, agreeable to evaluation at this time. Prior to admission the pt was independent with mobility without use of AD for ambulation and independent with ADLs. The pt now presents with limitations in functional mobility, strength, power, dynamic stability, and activity tolerance due to above dx, and will continue to benefit from skilled PT to address these deficits. The pt was able to demo good independence with bed mobility, but required modA to power up from sitting at the EOB, and modA with frequent verbal cues for RLE movement to complete short, heel-toe movement of R foot to pivot to recliner. The pt reports significant fatigue following short bout of standing activity, and will require continued therapy to progress functional strength and power for transfers, as well as endurance and stability to allow for longer bouts of mobility and capacity to navigate the 3 steps to enter her home. Pt would like to return home with HHPT if possible, currently may need short bout of SNF rehab to facilitate return home.      Follow Up Recommendations SNF (vs HHPT pending pt progression)    Equipment Recommendations   (pt has necessary RW and WC)    Recommendations for Other Services       Precautions / Restrictions Precautions Precautions:  Fall Restrictions Weight Bearing Restrictions: Yes LLE Weight Bearing: Partial weight bearing LLE Partial Weight Bearing Percentage or Pounds: partial wt bearing, through heel only      Mobility  Bed Mobility Overal bed mobility: Modified Independent             General bed mobility comments: increased tim and effort from flat bed, no assist given    Transfers Overall transfer level: Needs assistance Equipment used: Rolling walker (2 wheeled) Transfers: Sit to/from [facility] Sit to Stand: Mod assist;From elevated surface Stand pivot transfers: Mod assist       General transfer comment: modA to power up with use of momentum and elevated bed. Pt with difficulty maintaining heel-only due to inability to DF at L ankle, maintained mostly NWB with transfers. use of heel-toe wiggle with RLE to pivot. increased effort, time, and multimodal cues as well as modA to steady and to manage RW  Ambulation/Gait             General Gait Details: pt unable to generate any clearance with BUE on RW to step with RLE     Balance Overall balance assessment: Needs assistance Sitting-balance support: No upper extremity supported Sitting balance-Leahy Scale: Good     Standing balance support: Bilateral upper extremity supported;During functional activity Standing balance-Leahy Scale: Poor Standing balance comment: reliant on BUE support                             Pertinent Vitals/Pain Pain Assessment: No/denies pain  Home Living Family/patient expects to be discharged to:: Private residence Living Arrangements: Alone Available Help at Discharge: Family;Available 24 hours/day Type of Home: House Home Access: Stairs to enter Entrance Stairs-Rails: Chemical engineer of Steps: 3 Home Layout: One level Home Equipment: Walker - 2 wheels;Cane - single point;Bedside commode;Shower seat;Wheelchair - manual      Prior Function Level of  Independence: Independent with assistive device(s)         Comments: drives, retired, no AD use at this time, independent with ADLs     Hand Dominance   Dominant Hand: Right    Extremity/Trunk Assessment   Upper Extremity Assessment Upper Extremity Assessment: Overall WFL for tasks assessed    Lower Extremity Assessment Lower Extremity Assessment: Generalized weakness;LLE deficits/detail LLE Deficits / Details: grossly 4/5 at knee and hip, pt unable to complete ankle DF against gravity LLE Sensation: history of peripheral neuropathy    Cervical / Trunk Assessment Cervical / Trunk Assessment: Other exceptions Cervical / Trunk Exceptions: large body habitus  Communication   Communication: No difficulties  Cognition Arousal/Alertness: Awake/alert Behavior During Therapy: WFL for tasks assessed/performed Overall Cognitive Status: Within Functional Limits for tasks assessed                                        General Comments General comments (skin integrity, edema, etc.): VSS on RA    Exercises     Assessment/Plan    PT Assessment Patient needs continued PT services  PT Problem List Decreased range of motion;Decreased strength;Decreased activity tolerance;Decreased balance;Decreased mobility       PT Treatment Interventions DME instruction;Gait training;Stair training;Therapeutic exercise;Functional mobility training;Balance training;Therapeutic activities;Patient/family education    PT Goals (Current goals can be found in the Care Plan section)  Acute Rehab PT Goals Patient Stated Goal: return home, improve independence PT Goal Formulation: With patient Time For Goal Achievement: 02/17/21 Potential to Achieve Goals: Good    Frequency Min 3X/week    AM-PAC PT "6 Clicks" Mobility  Outcome Measure Help needed turning from your back to your side while in a flat bed without using bedrails?: None Help needed moving from lying on your back to  sitting on the side of a flat bed without using bedrails?: None Help needed moving to and from a bed to a chair (including a wheelchair)?: A Lot Help needed standing up from a chair using your arms (e.g., wheelchair or bedside chair)?: A Lot Help needed to walk in hospital room?: A Lot Help needed climbing 3-5 steps with a railing? : Total 6 Click Score: 15    End of Session Equipment Utilized During Treatment: Gait belt Activity Tolerance: Patient tolerated treatment well Patient left: in chair;with call bell/phone within reach Nurse Communication: Mobility status (recommend lateral scoot) PT Visit Diagnosis: Other abnormalities of gait and mobility (R26.89);Muscle weakness (generalized) (M62.81)    Time: 2956-2130 PT Time Calculation (min) (ACUTE ONLY): 28 min   Charges:   PT Evaluation $PT Eval Moderate Complexity: 1 Mod PT Treatments $Therapeutic Activity: 8-22 mins        Karma Ganja, PT, DPT   Acute Rehabilitation Department Pager #: [phone]  Otho Bellows 02/03/2021, 5:32 PM

## 2021-02-03 NOTE — Anesthesia Postprocedure Evaluation (Signed)
Anesthesia Post Note  Patient: [name]  Procedure(s) Performed: IRRIGATION AND DEBRIDEMENT EXTREMITY WITH INTERNAL AMPUTATION FIRST AND FIFTH RAY (Left Foot)     Patient location during evaluation: PACU Anesthesia Type: General Level of consciousness: awake Pain management: pain level controlled Vital Signs Assessment: post-procedure vital signs reviewed and stable Respiratory status: spontaneous breathing, nonlabored ventilation, respiratory function stable and patient connected to nasal cannula oxygen Cardiovascular status: blood pressure returned to baseline and stable Postop Assessment: no apparent nausea or vomiting Anesthetic complications: no   No complications documented.  Last Vitals:  Vitals:   02/03/21 1248 02/03/21 1554  BP: (!) 115/47 (!) 121/56  Pulse: (!) 52 (!) 59  Resp: 11 18  Temp: 36.7 C 36.7 C  SpO2: 97% 97%    Last Pain:  Vitals:   02/03/21 1554  TempSrc: Oral  PainSc:                  Jerami Tammen P [place] Jr

## 2021-02-03 NOTE — Progress Notes (Signed)
PROGRESS NOTE   [name]  MEQ:[id number] DOB: 10-02-1956 DOA: 01/28/2021 PCP: [name], MD  Brief Narrative:   48 white female Diabetes mellitus type 2 on insulin Hypothyroid Nonischemic cardiomyopathy combined systolic diastolic heart failure status post removal ICD secondary to bacteremia PAD left anterior tibial artery operation 10/06/2018 OSA on CPAP NSTEMI 2019 with MSSA bacteremia status post toe amputation-had TB and lead vegetations status post angio vac and ICD removal [facility] Atrial fibrillation on amiodarone  Nonhealing left foot wound that started in January-previously followed by podiatry in [facility] and then [facility]-Rx about 1 month ago clindamycin for cellulitis ABI 01/30/2021 = left ABI 0.85 monophasic and TBI 0.14 Right lower extremity ABI is 1.23 with triphasic flow MRI = osteomyelitis head fifth metatarsal base proximal phalanx--had initial surgery with podiatry and then  underwent CO2 angiogram 4/29 under Dr. Doren Custard Undergoing revision of surgery 4/30 Dr. Posey Pronto  PT input pending  Hospital-Problem based course Osteomyelitis left foot wound Failed outpatient management on clindamycin-appreciate podiatry input-for repeat podiatry surgery 4/30 Continue cefepime vancomycin-narrow as per podiatry BC x 2  --5 d. from admit neg from podiatrist orders discussed with Dr. Posey Pronto re: wght bearing- Patient going for revision of surgery 4/30 Peripheral vascular disease CO2 angiogram performed 4/29 In-stent stenosis 3 places L SFA  Defer further management to vascular surgeon-outpatient follow-up Dr. Doren Custard NICM--removal of ICD's for bacteremia MSSA in the past Blood culture negative X2 from 4/24--- no concerns for bacteremia at this time given negative cultures Patient being paced heart rates in the 50s Continuing losartan 12.5 Imdur 30 Coreg 3.125 Demadex held until all procedures performed PAD status post lower extremity interventions in the past ABIs  as above Atrial fibrillation CHADS2 score >4 on amiodarone Continue amiodarone 200 at bedtime Continue Plavix 75, aspirin 81 and hold as per instructions from Dr. Scot Dock OSA on CPAP Resume cpap DM TY 2 AiC 8.1 with neuropathy nephropathy and CKD 4 Check sugars every 4 until all procedures performed Monitor on ARB Sugars 2 31-3 74 BMI 41 Requires further outpatient discussion of the same   DVT prophylaxis: Heparin Code Status: Full Family Communication: none present--lives with mother Disposition:  Status is: Inpatient  Remains inpatient appropriate because:Hemodynamically unstable, Persistent severe electrolyte disturbances, Altered mental status and Ongoing diagnostic testing needed not appropriate for outpatient work up   Dispo: The patient is from: Home              Anticipated d/c is to: To be determined--await EVal by therapy once patient finished all procedures              Patient currently is not medically stable to d/c.   Difficult to place patient No   Consultants:   Podiatrist  Vascular surgeon  Procedures:   Antimicrobials:  Vancomycin and cefepime since 4/24   Subjective:  Arousable no distress N.p.o. for procedure No distress   Objective: Vitals:   02/02/21 2200 02/02/21 2330 02/03/21 0348 02/03/21 0814  BP: (!) 117/55 (!) 108/47 (!) 102/59 109/61  Pulse: 62 60 60 (!) 53  Resp: 16 20 16 16   Temp: 98 F (36.7 C) 98.2 F (36.8 C) 98 F (36.7 C) 97.7 F (36.5 C)  TempSrc: Oral Oral Oral Oral  SpO2: 100% 98% 98% 100%  Weight:      Height:        Intake/Output Summary (Last 24 hours) at 02/03/2021 0906 Last data filed at 02/03/2021 0815 Gross per 24 hour  Intake 460  ml  Output 1775 ml  Net -1315 ml   Filed Weights   01/28/21 1941 02/02/21 1944  Weight: 122.7 kg 122.8 kg    Examination:  EOMI NCAT no focal deficit no icterus pallor Wound not examined Chest clear no added sounds Paced rhythm sinus, sinus bradycardia on monitors  50 Neurologically intact  Data Reviewed: personally reviewed   Bun/creat 42/2.13-->47/2.2-->40/1.88  Radiology Studies: Initial surgery Dr. Posey Pronto  1.  Left foot incision and drainage washout debridement of the hallux and fifth metatarsal 2.  Bone biopsy of the left foot (hallux and fifth metatarsal) 3.  Packed primarily open 4.  Sesamoidectomy  VVS PROCEDURE:     1.  Conscious sedation 2.  Ultrasound-guided access to the right common femoral artery 3.  CO2 aortogram 4.  Selective catheterization of the left external iliac artery with left lower extremity runoff using CO2 (second order catheterization) 5.  Selective catheterization of the left superficial femoral artery (third order catheterization) 6.  Angioplasty of 3 separate in-stent stenoses in the left superficial femoral artery.  Scheduled Meds: . [MAR Hold] amiodarone  200 mg Oral q morning  . [MAR Hold] aspirin EC  81 mg Oral Daily  . [MAR Hold] atorvastatin  10 mg Oral Daily  . [MAR Hold] carvedilol  3.125 mg Oral Daily  . chlorhexidine  15 mL Mouth/Throat Once   Or  . mouth rinse  15 mL Mouth Rinse Once  . chlorhexidine      . [MAR Hold] clopidogrel  75 mg Oral Daily  . [MAR Hold] DULoxetine  30 mg Oral QHS  . [MAR Hold] ferrous sulfate  325 mg Oral Q breakfast  . [MAR Hold] gabapentin  400 mg Oral BID  . [MAR Hold] heparin  5,000 Units Subcutaneous Q8H  . [MAR Hold] insulin aspart  0-20 Units Subcutaneous TID WC  . [MAR Hold] insulin aspart  0-5 Units Subcutaneous QHS  . [MAR Hold] insulin glargine  22 Units Subcutaneous QHS  . [MAR Hold] isosorbide mononitrate  30 mg Oral Daily  . [MAR Hold] levothyroxine  50 mcg Oral Q0600  . [MAR Hold] losartan  12.5 mg Oral q morning  . [MAR Hold] sodium chloride flush  3 mL Intravenous Q12H  . [MAR Hold] traMADol  50 mg Oral QHS   Continuous Infusions: . [MAR Hold] sodium chloride    . [MAR Hold] ceFEPime (MAXIPIME) IV 2 g (02/02/21 2142)  . lactated ringers    . [MAR  Hold] vancomycin 1,500 mg (02/01/21 1959)     LOS: 6 days   Time spent: 22 minutes  Nita Sells, MD [facility] To contact the attending provider between 7A-7P or the covering provider during after hours 7P-7A, please log into the web site [email or web address] and access using universal [place] password for that web site. If you do not have the password, please call the hospital operator.  02/03/2021, 9:06 AM

## 2021-02-03 NOTE — Interval H&P Note (Signed)
History and Physical Interval Note:  02/03/2021 9:11 AM  [name]  has presented today for surgery, with the diagnosis of abscess left foot.  The various methods of treatment have been discussed with the patient and family. After consideration of risks, benefits and other options for treatment, the patient has consented to  Procedure(s): IRRIGATION AND DEBRIDEMENT EXTREMITY WITH POSSIBLE AMPUTATION FIRST AND FIFTH RAY (Left) as a surgical intervention.  The patient's history has been reviewed, patient examined, no change in status, stable for surgery.  I have reviewed the patient's chart and labs.  Questions were answered to the patient's satisfaction.     Felipa Furnace

## 2021-02-03 NOTE — Anesthesia Procedure Notes (Signed)
Procedure Name: LMA Insertion Date/Time: 02/03/2021 10:06 AM Performed by: Janace Litten, CRNA Pre-anesthesia Checklist: Patient identified, Emergency Drugs available, Suction available and Patient being monitored Patient Re-evaluated:Patient Re-evaluated prior to induction Oxygen Delivery Method: Circle System Utilized Preoxygenation: Pre-oxygenation with 100% oxygen Induction Type: IV induction Ventilation: Mask ventilation without difficulty LMA: LMA inserted LMA Size: 4.0 Number of attempts: 1 Airway Equipment and Method: Bite block Placement Confirmation: positive ETCO2 Tube secured with: Tape Dental Injury: Teeth and Oropharynx as per pre-operative assessment

## 2021-02-04 LAB — CBC WITH DIFFERENTIAL/PLATELET
Abs Immature Granulocytes: 0.05 10*3/uL (ref 0.00–0.07)
Basophils Absolute: 0 10*3/uL (ref 0.0–0.1)
Basophils Relative: 0 %
Eosinophils Absolute: 0 10*3/uL (ref 0.0–0.5)
Eosinophils Relative: 0 %
HCT: 33.8 % — ABNORMAL LOW (ref 36.0–46.0)
Hemoglobin: 10.6 g/dL — ABNORMAL LOW (ref 12.0–15.0)
Immature Granulocytes: 1 %
Lymphocytes Relative: 10 %
Lymphs Abs: 0.9 10*3/uL (ref 0.7–4.0)
MCH: 30.7 pg (ref 26.0–34.0)
MCHC: 31.4 g/dL (ref 30.0–36.0)
MCV: 98 fL (ref 80.0–100.0)
Monocytes Absolute: 0.5 10*3/uL (ref 0.1–1.0)
Monocytes Relative: 5 %
Neutro Abs: 7.5 10*3/uL (ref 1.7–7.7)
Neutrophils Relative %: 84 %
Platelets: 188 10*3/uL (ref 150–400)
RBC: 3.45 MIL/uL — ABNORMAL LOW (ref 3.87–5.11)
RDW: 14.2 % (ref 11.5–15.5)
WBC: 8.9 10*3/uL (ref 4.0–10.5)
nRBC: 0 % (ref 0.0–0.2)

## 2021-02-04 LAB — GLUCOSE, CAPILLARY
Glucose-Capillary: 181 mg/dL — ABNORMAL HIGH (ref 70–99)
Glucose-Capillary: 341 mg/dL — ABNORMAL HIGH (ref 70–99)
Glucose-Capillary: 164 mg/dL — ABNORMAL HIGH (ref 70–99)
Glucose-Capillary: 201 mg/dL — ABNORMAL HIGH (ref 70–99)

## 2021-02-04 LAB — BASIC METABOLIC PANEL
Anion gap: 8 (ref 5–15)
BUN: 49 mg/dL — ABNORMAL HIGH (ref 8–23)
CO2: 25 mmol/L (ref 22–32)
Calcium: 8.5 mg/dL — ABNORMAL LOW (ref 8.9–10.3)
Chloride: 104 mmol/L (ref 98–111)
Creatinine, Ser: 2.24 mg/dL — ABNORMAL HIGH (ref 0.44–1.00)
GFR, Estimated: 24 mL/min — ABNORMAL LOW (ref >60)
Glucose, Bld: 273 mg/dL — ABNORMAL HIGH (ref 70–99)
Potassium: 5.1 mmol/L (ref 3.5–5.1)
Sodium: 137 mmol/L (ref 135–145)

## 2021-02-04 MED ORDER — TORSEMIDE 20 MG PO TABS
20.0000 mg | ORAL_TABLET | Freq: Two times a day (BID) | ORAL | Status: DC
  Administered 2021-02-04 – 2021-02-05 (×3): 20 mg via ORAL
  Filled 2021-02-04 (×3): qty 1

## 2021-02-04 MED FILL — Clopidogrel Bisulfate Tab 75 MG (Base Equiv): ORAL | Qty: 1 | Status: AC

## 2021-02-04 NOTE — Discharge Instructions (Signed)
  [facility]  Discharge Instructions  Lower Extremity Angiogram; Angioplasty/Stenting  Please refer to the following instructions for your post-procedure care. Your surgeon or physician assistant will discuss any changes with you.  Activity  Avoid lifting more than 8 pounds (1 gallons of milk) for 5 days after your procedure. You may walk as much as you can tolerate. It's OK to drive after 72 hours.  Bathing/Showering  You may shower the day after your procedure. If you have a bandage, you may remove it at 24- 48 hours. Clean your incision site with mild soap and water. Pat the area dry with a clean towel.  Diet  Resume your pre-procedure diet. There are no special food restrictions following this procedure. All patients with peripheral vascular disease should follow a low fat/low cholesterol diet. In order to heal from your surgery, it is CRITICAL to get adequate nutrition. Your body requires vitamins, minerals, and protein. Vegetables are the best source of vitamins and minerals. Vegetables also provide the perfect balance of protein. Processed food has little nutritional value, so try to avoid this.  Medications  Resume taking all of your medications unless your doctor tells you not to. If your incision is causing pain, you may take over-the-counter pain relievers such as acetaminophen (Tylenol)  Follow Up  Follow up will be arranged at the time of your procedure. You may have an office visit scheduled or may be scheduled for surgery. Ask your surgeon if you have any questions.  Please call us immediately for any of the following conditions: .Severe or worsening pain your legs or feet at rest or with walking. .Increased pain, redness, drainage at your groin puncture site. .Fever of 101 degrees or higher. .If you have any mild or slow bleeding from your puncture site: lie down, apply firm constant pressure over the area with a piece of gauze or a  clean wash cloth for 30 minutes- no peeking!, call 911 right away if you are still bleeding after 30 minutes, or if the bleeding is heavy and unmanageable.  Reduce your risk factors of vascular disease:  . Stop smoking. If you would like help call [facility] at 1-800-QUIT-NOW (1-800-784-8669) or [place] at 336-586-4000. . Manage your cholesterol . Maintain a desired weight . Control your diabetes . Keep your blood pressure down .  If you have any questions, please call the office at [phone] 

## 2021-02-04 NOTE — Progress Notes (Addendum)
Mobility Specialist - Progress Note   02/04/21 1254  Mobility  Activity Ambulated in hall  Level of Assistance Minimal assist, patient does 75% or more  Assistive Device Front wheel walker  Mobility Response Tolerated well  Mobility performed by Mobility specialist  $Mobility charge 1 Mobility   Pt min assist to power up from bed w/ height elevated. She did not want to bear weight on her L heel and was min guard w/ RW to pivot to the chair. Pt states she has a more difficult time standing from the chair, will f/u later to assist pt into bed. HR remained ~56 throughout.   Addendum: Pt pivoted to chair, she did not ambulate.   Pricilla Handler Mobility Specialist Mobility Specialist Phone: [phone]

## 2021-02-04 NOTE — Plan of Care (Signed)
  Problem: Education: Goal: Knowledge of General Education information will improve Description Including pain rating scale, medication(s)/side effects and non-pharmacologic comfort measures Outcome: Progressing   

## 2021-02-04 NOTE — Progress Notes (Signed)
Mobility Specialist - Progress Note   02/04/21 1508  Mobility  Activity Transferred:  Chair to bed  Level of Assistance Moderate assist, patient does 50-74%  Assistive Device Front wheel walker  Mobility Response Tolerated well  Mobility performed by Mobility specialist  $Mobility charge 1 Mobility   Pt mod assist to stand from recliner. She was min guard while pivoting to the bed. HR in 50s throughout. Pt laying in bed w/ call bell at side.   Pricilla Handler Mobility Specialist Mobility Specialist Phone: [phone]

## 2021-02-04 NOTE — Progress Notes (Addendum)
Progress Note    02/04/2021 9:27 AM 1 Day Post-Op  Subjective: no complaints   Vitals:   02/04/21 0359 02/04/21 0904  BP: (!) 136/52 (!) 120/48  Pulse:  62  Resp: 12 20  Temp: 98.7 F (37.1 C) 99.1 F (37.3 C)  SpO2: 98% 96%   Physical Exam: Cardiac:  regular Lungs: non labored Extremities: left foot dressed. Right common femoral access site is clean, dry and intact without swelling or hematoma. Left doppler PT/AT signals Abdomen:  Obese, soft, non tender Neurologic: alert and oriented  CBC    Component Value Date/Time   WBC 8.9 02/04/2021 0102   RBC 3.45 (L) 02/04/2021 0102   HGB 10.6 (L) 02/04/2021 0102   HGB 11.2 09/06/2019 1539   HCT 33.8 (L) 02/04/2021 0102   HCT 33.6 (L) 09/06/2019 1539   PLT 188 02/04/2021 0102   PLT 196 09/06/2019 1539   MCV 98.0 02/04/2021 0102   MCV 87 09/06/2019 1539   MCH 30.7 02/04/2021 0102   MCHC 31.4 02/04/2021 0102   RDW 14.2 02/04/2021 0102   RDW 14.2 09/06/2019 1539   LYMPHSABS 0.9 02/04/2021 0102   LYMPHSABS 1.7 04/15/2018 1532   MONOABS 0.5 02/04/2021 0102   EOSABS 0.0 02/04/2021 0102   EOSABS 0.1 04/15/2018 1532   BASOSABS 0.0 02/04/2021 0102   BASOSABS 0.0 04/15/2018 1532    BMET    Component Value Date/Time   NA 137 02/04/2021 0102   NA 140 09/06/2019 1539   K 5.1 02/04/2021 0102   CL 104 02/04/2021 0102   CO2 25 02/04/2021 0102   GLUCOSE 273 (H) 02/04/2021 0102   BUN 49 (H) 02/04/2021 0102   BUN 62 (H) 09/06/2019 1539   CREATININE 2.24 (H) 02/04/2021 0102   CREATININE 1.51 (H) 08/06/2016 1431   CALCIUM 8.5 (L) 02/04/2021 0102   GFRNONAA 24 (L) 02/04/2021 0102   GFRNONAA 46 (L) 12/21/2013 1132   GFRAA 23 (L) 11/05/2019 1327   GFRAA 53 (L) 12/21/2013 1132    INR    Component Value Date/Time   INR 1.2 11/28/2020 1041     Intake/Output Summary (Last 24 hours) at 02/04/2021 5366 Last data filed at 02/03/2021 1557 Gross per 24 hour  Intake 800 ml  Output 270 ml  Net 530 ml     Assessment/Plan:   65 y.o. female is s/p 1.  Conscious sedation 2.  Ultrasound-guided access to the right common femoral artery 3.  CO2 aortogram 4.  Selective catheterization of the left external iliac artery with left lower extremity runoff using CO2 (second order catheterization) 5.  Selective catheterization of the left superficial femoral artery (third order catheterization) 6.  Angioplasty of 3 separate in-stent stenoses in the left superficial femoral artery. 2 Day Post-Op. Doing well post arteriogram. Left lower extremity is well perfused with PT/ AT signals. Slight increase in Scr 2.24 from 1.88 however she is at close to her baseline. Podiatry took her to Operating room yesterday for  1.  Bone resection of the base of the proximal phalanx 2.  Delayed primary closure 3.  Bone resection of the fifth metatarsal head and base of the proximal phalanx of the fifth. Wound care of left foot per podiatry. She is stable and optimized from vascular standpoint. She will go home on Aspirin, statin and plavix. She will follow up in 1 month with arterial duplex and ABI studies in our office  DVT prophylaxis:     Karoline Caldwell, PA-C [facility] [phone] 02/04/2021  9:27 AM   I agree with the above.  Patient is status post angiography with angioplasty of in-stent stenosis.  She has had no complications from her intervention.  She has scheduled follow-up in 1 month  Wells Breonia Kirstein

## 2021-02-04 NOTE — Progress Notes (Signed)
PROGRESS NOTE   [name]  DZH:[id number] DOB: 02-Feb-1956 DOA: 01/28/2021 PCP: [name], MD  Brief Narrative:   67 white female Diabetes mellitus type 2 on insulin Hypothyroid Nonischemic cardiomyopathy combined systolic diastolic heart failure status post removal ICD secondary to bacteremia PAD left anterior tibial artery operation 10/06/2018 OSA on CPAP NSTEMI 2019 with MSSA bacteremia status post toe amputation-had TB and lead vegetations status post angio vac and ICD removal [facility] Atrial fibrillation on amiodarone  Nonhealing left foot wound that started in January-previously followed by podiatry in [facility] and then [facility]-Rx about 1 month ago clindamycin for cellulitis ABI 01/30/2021 = left ABI 0.85 monophasic and TBI 0.14 Right lower extremity ABI is 1.23 with triphasic flow MRI = osteomyelitis head fifth metatarsal base proximal phalanx--had initial surgery with podiatry and then  underwent CO2 angiogram 4/29 under Dr. Scot Dock Undergoing revision of surgery 4/30 Dr. Posey Pronto   Currently awaitng Re-eval by PT re: dispo and narrowing of Antibiotics  Hospital-Problem based course Osteomyelitis left foot wound Failed outpatient management on clindamycin Continue cefepime vancomycin-shows Enterococcus/Staph on 4/30 Wound cult BC x 2  --5 d. from admit neg from podiatrist S/p revision of surgery 4/30 Foot Drop LLE Can be heel weightbearing but difficult to ambulate with current postop boot Secondary to diabetic polyneuropathy Defer to PT recommendations regarding equipment/placement Peripheral vascular disease CO2 angiogram performed 4/29---In-stent stenosis 3 places L SFA  -outpatient follow-up Dr. Scot Dock NICM--removal of ICD's for bacteremia MSSA in the past Blood culture negative X2 from 4/24--- no bacteremia-has negative cultures DC monitors as has been stable Continuing losartan 12.5 Imdur 30 Coreg 3.125 demadex resumed at dose 20 bid PAD status  post lower extremity interventions in the past ABIs as above Atrial fibrillation CHADS2 score >4 on amiodarone Continue amiodarone 200 at bedtime [OP LFT/TSH in 3 mo] Continue Plavix 75, aspirin 81  OSA on CPAP Resume cpap DM TY 2 AiC 8.1 with neuropathy nephropathy and CKD 4 Check sugars every 4 until all procedures performed CBG 164--341 BMI 41 Requires further outpatient discussion of the same   DVT prophylaxis: Heparin Code Status: Full Family Communication: none present--lives with mother Disposition:  Status is: Inpatient  Remains inpatient appropriate because:Hemodynamically unstable, Persistent severe electrolyte disturbances, Altered mental status and Ongoing diagnostic testing needed not appropriate for outpatient work up   Dispo: The patient is from: Home              Anticipated d/c is to: To be determined--await EVal by therapy once patient finished all procedures              Patient currently is not medically stable to d/c.   Difficult to place patient No   Consultants:   Podiatrist  Vascular surgeon  Procedures:   Antimicrobials:  Vancomycin and cefepime since 4/24   Subjective:  Well Tells me has PTA polyneuropathy--difficulty with Darco boot and post op shoe--also concerned about ambulation Eating and drinking ok No cp   Objective: Vitals:   02/04/21 0359 02/04/21 0904 02/04/21 1246 02/04/21 1724  BP: (!) 136/52 (!) 120/48 116/65 (!) 102/49  Pulse:  62 (!) 56 (!) 52  Resp: 12 20 12 16   Temp: 98.7 F (37.1 C) 99.1 F (37.3 C) 98.5 F (36.9 C) 97.6 F (36.4 C)  TempSrc: Oral Oral Oral Oral  SpO2: 98% 96% 97% 99%  Weight:      Height:        Intake/Output Summary (Last 24 hours) at 02/04/2021  [place] filed at 02/04/2021 1726 Gross per 24 hour  Intake --  Output 550 ml  Net -550 ml   Filed Weights   01/28/21 1941 02/02/21 1944  Weight: 122.7 kg 122.8 kg    Examination:  Pleasant no distress eomi ncat no focal  defciit Sinus sinus brady on monitors Chest clear no added sounds abd soft nt nd no rebound  Data Reviewed: personally reviewed   Bun/creat 42/2.13-->47/2.2-->40/1.88-->49/2.24 K 5.1 WBC 8.9, Hemoglobin 10.6  Radiology Studies: Initial surgery Dr. Posey Pronto  1.  Left foot incision and drainage washout debridement of the hallux and fifth metatarsal 2.  Bone biopsy of the left foot (hallux and fifth metatarsal) 3.  Packed primarily open 4.  Sesamoidectomy  VVS PROCEDURE 4/29:     1.  Conscious sedation 2.  Ultrasound-guided access to the right common femoral artery 3.  CO2 aortogram 4.  Selective catheterization of the left external iliac artery with left lower extremity runoff using CO2 (second order catheterization) 5.  Selective catheterization of the left superficial femoral artery (third order catheterization) 6.  Angioplasty of 3 separate in-stent stenoses in the left superficial femoral artery.  Scheduled Meds: . amiodarone  200 mg Oral q morning  . aspirin EC  81 mg Oral Daily  . atorvastatin  10 mg Oral Daily  . carvedilol  3.125 mg Oral Daily  . clopidogrel  75 mg Oral Daily  . DULoxetine  30 mg Oral QHS  . ferrous sulfate  325 mg Oral Q breakfast  . gabapentin  400 mg Oral BID  . heparin  5,000 Units Subcutaneous Q8H  . insulin aspart  0-20 Units Subcutaneous TID WC  . insulin aspart  0-5 Units Subcutaneous QHS  . insulin glargine  22 Units Subcutaneous QHS  . isosorbide mononitrate  30 mg Oral Daily  . levothyroxine  50 mcg Oral Q0600  . losartan  12.5 mg Oral q morning  . sodium chloride flush  3 mL Intravenous Q12H  . torsemide  20 mg Oral BID  . traMADol  50 mg Oral QHS   Continuous Infusions: . sodium chloride    . ceFEPime (MAXIPIME) IV 2 g (02/04/21 0927)  . vancomycin 1,500 mg (02/03/21 1952)     LOS: 7 days   Time spent: 22 minutes  Nita Sells, MD [facility] To contact the attending provider between 7A-7P or the covering  provider during after hours 7P-7A, please log into the web site [email or web address] and access using universal [place] password for that web site. If you do not have the password, please call the hospital operator.  02/04/2021, 5:46 PM

## 2021-02-05 ENCOUNTER — Encounter (HOSPITAL_COMMUNITY): Payer: Self-pay | Admitting: Vascular Surgery

## 2021-02-05 ENCOUNTER — Ambulatory Visit: Payer: HMO | Admitting: Podiatry

## 2021-02-05 LAB — COMPREHENSIVE METABOLIC PANEL
ALT: 24 U/L (ref 0–44)
AST: 31 U/L (ref 15–41)
Albumin: 2.5 g/dL — ABNORMAL LOW (ref 3.5–5.0)
Alkaline Phosphatase: 49 U/L (ref 38–126)
Anion gap: 8 (ref 5–15)
BUN: 56 mg/dL — ABNORMAL HIGH (ref 8–23)
CO2: 23 mmol/L (ref 22–32)
Calcium: 8.4 mg/dL — ABNORMAL LOW (ref 8.9–10.3)
Chloride: 104 mmol/L (ref 98–111)
Creatinine, Ser: 2.33 mg/dL — ABNORMAL HIGH (ref 0.44–1.00)
GFR, Estimated: 23 mL/min — ABNORMAL LOW (ref >60)
Glucose, Bld: 224 mg/dL — ABNORMAL HIGH (ref 70–99)
Potassium: 5 mmol/L (ref 3.5–5.1)
Sodium: 135 mmol/L (ref 135–145)
Total Bilirubin: 1 mg/dL (ref 0.3–1.2)
Total Protein: 6.1 g/dL — ABNORMAL LOW (ref 6.5–8.1)

## 2021-02-05 LAB — GLUCOSE, CAPILLARY
Glucose-Capillary: 146 mg/dL — ABNORMAL HIGH (ref 70–99)
Glucose-Capillary: 256 mg/dL — ABNORMAL HIGH (ref 70–99)
Glucose-Capillary: 185 mg/dL — ABNORMAL HIGH (ref 70–99)
Glucose-Capillary: 206 mg/dL — ABNORMAL HIGH (ref 70–99)

## 2021-02-05 LAB — VANCOMYCIN, TROUGH: Vancomycin Tr: 23 ug/mL (ref 15–20)

## 2021-02-05 MED ORDER — VANCOMYCIN HCL 1250 MG/250ML IV SOLN
1250.0000 mg | INTRAVENOUS | Status: DC
  Administered 2021-02-06: 1250 mg via INTRAVENOUS
  Filled 2021-02-05: qty 250

## 2021-02-05 MED ORDER — AMOXICILLIN-POT CLAVULANATE 875-125 MG PO TABS: 1.0000 | ORAL_TABLET | Freq: Two times a day (BID) | ORAL | Status: DC

## 2021-02-05 MED ORDER — AMOXICILLIN-POT CLAVULANATE 500-125 MG PO TABS
1.0000 | ORAL_TABLET | Freq: Two times a day (BID) | ORAL | Status: DC
  Administered 2021-02-05 – 2021-02-06 (×2): 500 mg via ORAL
  Filled 2021-02-05 (×4): qty 1

## 2021-02-05 NOTE — Progress Notes (Signed)
Pharmacy Antibiotic Note  [name] is a 65 y.o. female admitted on 01/28/2021 with worsening left foot infection and possible osteomyelitis.  Pharmacy has been consulted for vancomycin dosing. Cefepime d/c'd 5/2. ID awaiting c/s and may transition to Augmentin only 5/3 if appropriate. On Clindamycin PTA.  S/p I + D with arteriogram. Scr up a bit to 2.33 today.  Vancomycin trough this evening resulted slightly high at 23, drawn appropriately.  Plan: Push out timing of tonight's dose and adjust vancomycin dose down to 1250mg  IV q48h Monitor clinical progress, c/s, renal function F/u ID plans, further vancomycin levels as indicated   Height: 5\' 8"  (172.7 cm) Weight: 127.7 kg (281 lb 8.4 oz) IBW/kg (Calculated) : 63.9  Temp (24hrs), Avg:98.3 F (36.8 C), Min:97.9 F (36.6 C), Max:98.6 F (37 C)  Recent Labs  Lab 01/30/21 0250 02/01/21 0440 02/02/21 0551 02/03/21 0049 02/04/21 0102 02/05/21 0143 02/05/21 1954  WBC 7.9 8.7 6.9  --  8.9  --   --   CREATININE 2.21* 2.13* 2.21* 1.88*  1.86* 2.24* 2.33*  --   VANCOTROUGH  --   --   --   --   --   --  23*    Estimated Creatinine Clearance: 33.5 mL/min (A) (by C-G formula based on SCr of 2.33 mg/dL (H)).    No Known Allergies    Arturo Morton, PharmD, BCPS Please check AMION for all [place] contact numbers Clinical Pharmacist 02/05/2021 9:29 PM

## 2021-02-05 NOTE — Progress Notes (Signed)
Physical Therapy Treatment Patient Details Name: [name] MRN: [id number] DOB: August 30, 1956 Today's Date: 02/05/2021    History of Present Illness The pt is a 65 yo female presenting 4/24 due to non-healing wound on L foot after 3 weeks of antibiotics. Pt found to have dampened signals in the L foot and underwent arteriography with stant placement in superficial femoral artery on 4/29. I&D of L foot wound on 4/30. PMH includes: MI, osteomyelitis of foot and ankle, CAD, CKD, CHF, HTN, neuropathy, obesity, DM II, multiple toe amputations, and heart caths, and biventricular cardioverter defibrillator implantation.    PT Comments    Pt progressing well toward goals.  She would like to work on getting home with HHPT if able.  Emphasis on transitions, sit to stands and progression of gait with the RW and as little weight on L foot surgical sites as possible.  Will work on negotiating the stairs next session, again in attempt to go home.    Follow Up Recommendations  Home health PT;Supervision - Intermittent     Equipment Recommendations  TBA, has RW and w/c  Recommendations for Other Services       Precautions / Restrictions Precautions Precautions: Fall Restrictions LLE Weight Bearing: Partial weight bearing (As little weight as possible biased toward the heel) LLE Partial Weight Bearing Percentage or Pounds: partial wt bearing, through heel only    Mobility  Bed Mobility Overal bed mobility: Modified Independent                  Transfers Overall transfer level: Needs assistance Equipment used: Rolling walker (2 wheeled) Transfers: Sit to/from [employer] Sit to Stand: Min guard Stand pivot transfers: Min guard       General transfer comment: appropriate use of UE's.  Use of ortho shoe, rigged up a df assist AFO and had pt attemp weight biase toward heel.  Ambulation/Gait Ambulation/Gait assistance: Min guard Gait Distance (Feet): 60  Feet Assistive device: Rolling walker (2 wheeled) Gait Pattern/deviations: Step-to pattern   Gait velocity interpretation: <1.8 ft/sec, indicate of risk for recurrent falls General Gait Details: stable step to pattern with L heel bias and weight through UE's during L w/bearing.   Stairs             Wheelchair Mobility    Modified Rankin (Stroke Patients Only)       Balance Overall balance assessment: Needs assistance   Sitting balance-Leahy Scale: Good     Standing balance support: Bilateral upper extremity supported Standing balance-Leahy Scale: Poor Standing balance comment: reliant on BUE support                            Cognition Arousal/Alertness: Awake/alert Behavior During Therapy: WFL for tasks assessed/performed Overall Cognitive Status: Within Functional Limits for tasks assessed                                        Exercises      General Comments General comments (skin integrity, edema, etc.): vss      Pertinent Vitals/Pain Pain Assessment: Faces Faces Pain Scale: No hurt Pain Intervention(s): Monitored during session    Home Living                      Prior Function  PT Goals (current goals can now be found in the care plan section) Acute Rehab PT Goals Patient Stated Goal: return home, improve independence PT Goal Formulation: With patient Time For Goal Achievement: 02/17/21 Potential to Achieve Goals: Good Progress towards PT goals: Progressing toward goals    Frequency    Min 3X/week      PT Plan Current plan remains appropriate    Co-evaluation              AM-PAC PT "6 Clicks" Mobility   Outcome Measure  Help needed turning from your back to your side while in a flat bed without using bedrails?: None Help needed moving from lying on your back to sitting on the side of a flat bed without using bedrails?: None Help needed moving to and from a bed to a chair  (including a wheelchair)?: A Little Help needed standing up from a chair using your arms (e.g., wheelchair or bedside chair)?: A Little Help needed to walk in hospital room?: A Little Help needed climbing 3-5 steps with a railing? : A Lot 6 Click Score: 19    End of Session   Activity Tolerance: Patient tolerated treatment well Patient left: in chair;with call bell/phone within reach Nurse Communication: Mobility status PT Visit Diagnosis: Other abnormalities of gait and mobility (R26.89);Difficulty in walking, not elsewhere classified (R26.2)     Time: 7425-9563 PT Time Calculation (min) (ACUTE ONLY): 43 min  Charges:  $Gait Training: 8-22 mins $Therapeutic Activity: 23-37 mins                     02/05/2021  Jacinto Halim., PT Acute Rehabilitation Services [phone]  (pager) [phone]  (office)   [name] 02/05/2021, 2:48 PM

## 2021-02-05 NOTE — Progress Notes (Signed)
Mobility Specialist: Progress Note   02/05/21 1501  Mobility  Activity Ambulated in room  Level of Assistance Independent after set-up  Assistive Device Front wheel walker  Distance Ambulated (ft) 50 ft  Mobility Response Tolerated well  Mobility performed by Mobility specialist  Bed Position Chair  $Mobility charge 1 Mobility   Post-Mobility: 51 HR, 98% SpO2  Distance limited due to pt's arms fatiguing. Pt otherwise asx during ambulation. Pt back to chair after walk with call bell at her side.   [facility] Karma Hiney Mobility Specialist Mobility Specialist Phone: [phone]

## 2021-02-05 NOTE — Progress Notes (Addendum)
Subjective: [name] is a 65 y.o. female patient seen at bedside, resting comfortably in no acute distress s/p left 1st and 5th MTPJ I&D with debridement and biopsy of bone performed by Dr. Posey Pronto, 4/27 and 02/03/21 staged closure.  Patient denies pain at surgical site, denies calf pain, denies headache, chest pain, shortness of breath, nausea, vomitting, denies loss of appetite, denies problems with voiding. Just finished working with PT. No other issues noted.   Patient Active Problem List   Diagnosis Date Noted  . CKD (chronic kidney disease) stage 4, GFR 15-29 ml/min (HCC) 01/28/2021  . AICD (automatic cardioverter/defibrillator) present 01/28/2021  . Cellulitis of left foot 11/25/2020  . Diabetic foot ulcer ([place]) 11/24/2020  . Advance care planning 10/04/2020  . Secondary hyperparathyroidism of renal origin ([place]) 07/20/2019  . Hyperkalemia 07/20/2019  . Benign hypertensive kidney disease with chronic kidney disease 07/20/2019  . Anemia in chronic kidney disease 07/20/2019  . Positive antinuclear antibody 07/20/2019  . Proteinuria 07/20/2019  . Acute on chronic systolic (congestive) heart failure ([place]) 03/23/2019  . UTI (urinary tract infection) 03/09/2019  . CAD (coronary artery disease) 03/09/2019  . DM type 2, uncontrolled, with renal complications ([place]) 35/46/5681  . Atrial tachycardia ([place])   . Acute kidney injury superimposed on chronic kidney disease ([name])   . AKI (acute kidney injury) ([place])   . Weakness generalized 12/22/2018  . Shortness of breath 12/22/2018  . Palliative care encounter 12/22/2018  . Endocarditis 12/14/2018  . NSTEMI (non-ST elevated myocardial infarction) ([place]) 11/22/2018  . Acute on chronic combined systolic and diastolic CHF (congestive heart failure) ([place]) 10/28/2018  . Lymphedema 10/28/2018  . Severe sepsis ([place]) 09/28/2018  . History of amputation of lesser toe of right foot ([place]) 04/19/2018  . Presence of cardiac resynchronization therapy  defibrillator (CRT-D) 04/19/2018  . Paraparesis of both lower limbs ([place]) 03/29/2018  . 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 Paroxysmal atrial fibrillation ([place]) 03/29/2018  . Class 3 obesity due to excess calories with serious comorbidity and body mass index (BMI) of 40.0 to 44.9 in adult 09/02/2016  . History of CVA (cerebrovascular accident) 11/11/2015  . Chronic renal insufficiency 08/04/2015  . OSA (obstructive sleep apnea) 08/15/2014  . Morbid obesity ([place]) 10/26/2013  . Essential hypertension, benign 04/29/2013  . Pure hypercholesterolemia 12/07/2012  . Iron deficiency anemia 12/07/2012  . Diabetic peripheral neuropathy associated with type 2 diabetes mellitus ([place]) 12/07/2012  . Chronic systolic congestive heart failure ([place]) 07/29/2012  . Left bundle-branch block 07/25/2012  . Coronary artery disease involving native coronary artery of native heart without angina pectoris 07/25/2012     Current Facility-Administered Medications:  .  0.9 %  sodium chloride infusion, 250 mL, Intravenous, PRN, Felipa Furnace, DPM .  acetaminophen (TYLENOL) tablet 650 mg, 650 mg, Oral, Q4H PRN, Felipa Furnace, DPM .  amiodarone (PACERONE) tablet 200 mg, 200 mg, Oral, q morning, Felipa Furnace, DPM, 200 mg at 02/05/21 0948 .  aspirin EC tablet 81 mg, 81 mg, Oral, Daily, Felipa Furnace, DPM, 81 mg at 02/05/21 2751 .  atorvastatin (LIPITOR) tablet 10 mg, 10 mg, Oral, Daily, Felipa Furnace, DPM, 10 mg at 02/05/21 7001 .  carvedilol (COREG) tablet 3.125 mg, 3.125 mg, Oral, Daily, Felipa Furnace, DPM, 3.125 mg at 02/05/21 7494 .  ceFEPIme (MAXIPIME) 2 g in sodium chloride 0.9 % 100 mL IVPB, 2 g, Intravenous, Q12H, Felipa Furnace, DPM, Last Rate: 200 mL/hr at 02/05/21 0905, 2 g at 02/05/21 0905 .  clopidogrel (PLAVIX) tablet 75 mg, 75 mg, Oral, Daily, Felipa Furnace, DPM, 75 mg at 02/05/21 3790 .  DULoxetine (CYMBALTA) DR capsule 30 mg, 30 mg, Oral, QHS, Felipa Furnace, DPM, 30 mg at 02/04/21 2201 .   ferrous sulfate tablet 325 mg, 325 mg, Oral, Q breakfast, Felipa Furnace, DPM, 325 mg at 02/05/21 2409 .  gabapentin (NEURONTIN) capsule 400 mg, 400 mg, Oral, BID, Felipa Furnace, DPM, 400 mg at 02/05/21 7353 .  heparin injection 5,000 Units, 5,000 Units, Subcutaneous, Q8H, Felipa Furnace, DPM, 5,000 Units at 02/05/21 1328 .  insulin aspart (novoLOG) injection 0-20 Units, 0-20 Units, Subcutaneous, TID WC, Felipa Furnace, DPM, 11 Units at 02/05/21 1324 .  insulin aspart (novoLOG) injection 0-5 Units, 0-5 Units, Subcutaneous, QHS, Felipa Furnace, DPM, 2 Units at 02/04/21 2208 .  insulin glargine (LANTUS) injection 22 Units, 22 Units, Subcutaneous, QHS, Felipa Furnace, DPM, 22 Units at 02/04/21 2206 .  isosorbide mononitrate (IMDUR) 24 hr tablet 30 mg, 30 mg, Oral, Daily, Felipa Furnace, DPM, 30 mg at 02/05/21 2992 .  labetalol (NORMODYNE) injection 10 mg, 10 mg, Intravenous, Q10 min PRN, Felipa Furnace, DPM .  levothyroxine (SYNTHROID) tablet 50 mcg, 50 mcg, Oral, Q0600, Felipa Furnace, DPM, 50 mcg at 02/05/21 4268 .  ondansetron (ZOFRAN) injection 4 mg, 4 mg, Intravenous, Q6H PRN, Boneta Lucks P, DPM .  ondansetron (ZOFRAN) tablet 4 mg, 4 mg, Oral, Q6H PRN **OR** [DISCONTINUED] ondansetron (ZOFRAN) injection 4 mg, 4 mg, Intravenous, Q6H PRN, Felipa Furnace, DPM .  oxyCODONE-acetaminophen (PERCOCET/ROXICET) 5-325 MG per tablet 1-2 tablet, 1-2 tablet, Oral, Q4H PRN, Felipa Furnace, DPM, 2 tablet at 02/03/21 1438 .  senna-docusate (Senokot-S) tablet 1 tablet, 1 tablet, Oral, QHS PRN, Boneta Lucks P, DPM .  sodium chloride flush (NS) 0.9 % injection 3 mL, 3 mL, Intravenous, Q12H, Felipa Furnace, DPM, 3 mL at 02/05/21 0949 .  sodium chloride flush (NS) 0.9 % injection 3 mL, 3 mL, Intravenous, PRN, Felipa Furnace, DPM, 3 mL at 02/03/21 0223 .  torsemide (DEMADEX) tablet 20 mg, 20 mg, Oral, BID, Nita Sells, MD, 20 mg at 02/05/21 0903 .  traMADol (ULTRAM) tablet 50 mg, 50 mg, Oral, Q6H PRN, Felipa Furnace, DPM, 50 mg at 02/02/21 1804 .  traMADol (ULTRAM) tablet 50 mg, 50 mg, Oral, QHS, Felipa Furnace, DPM, 50 mg at 02/04/21 2201 .  vancomycin (VANCOREADY) IVPB 1500 mg/300 mL, 1,500 mg, Intravenous, Q48H, Felipa Furnace, DPM, Last Rate: 150 mL/hr at 02/03/21 1952, 1,500 mg at 02/03/21 1952  No Known Allergies   Objective: Today's Vitals   02/04/21 2318 02/05/21 0522 02/05/21 0611 02/05/21 0900  BP: (!) 110/46 135/60  115/75  Pulse:  (!) 59  (!) 58  Resp: 18 18  15   Temp: 97.9 F (36.6 C) 98.1 F (36.7 C)  98.6 F (37 C)  TempSrc: Oral Oral  Oral  SpO2: 96% 96%  98%  Weight:   127.7 kg   Height:      PainSc:    0-No pain    General: No acute distress  Left lower extremity: Dressing to left foot is clean, dry, intact. No strikethrough noted, upon removal sutures intact at 5th and 1st MTPJ.  Capillary fill time present to all digits 1,3-5, gross sensation present via light touch to toes however patient does have significant neuropathy and lack of protective sensation . No pain to calf. Range of motion excluding  surgical site within normal limits with no pain or crepitation. History of left 2nd toe amputation.        Results for orders placed or performed during the hospital encounter of 01/28/21  Blood culture (routine x 2)     Status: None   Collection Time: 01/28/21  8:41 PM   Specimen: BLOOD RIGHT WRIST  Result Value Ref Range Status   Specimen Description BLOOD RIGHT WRIST  Final   Special Requests   Final    BOTTLES DRAWN AEROBIC AND ANAEROBIC Blood Culture adequate volume   Culture   Final    NO GROWTH 5 DAYS Performed at [facility], [address] [place], [place] [address]    Report Status 02/02/2021 FINAL  Final  Blood culture (routine x 2)     Status: None   Collection Time: 01/28/21  8:55 PM   Specimen: BLOOD LEFT ARM  Result Value Ref Range Status   Specimen Description BLOOD LEFT ARM  Final   Special Requests   Final    BOTTLES DRAWN AEROBIC  AND ANAEROBIC Blood Culture adequate volume   Culture   Final    NO GROWTH 5 DAYS Performed at [facility], [place] [address] [place], [place] [address]    Report Status 02/02/2021 FINAL  Final  Resp Panel by RT-PCR (Flu A&B, Covid) Nasopharyngeal Swab     Status: None   Collection Time: 01/28/21  9:39 PM   Specimen: Nasopharyngeal Swab; Nasopharyngeal(NP) swabs in vial transport medium  Result Value Ref Range Status   SARS Coronavirus 2 by RT PCR NEGATIVE NEGATIVE Final    Comment: (NOTE) SARS-CoV-2 target nucleic acids are NOT DETECTED.  The SARS-CoV-2 RNA is generally detectable in upper respiratory specimens during the acute phase of infection. The lowest concentration of SARS-CoV-2 viral copies this assay can detect is 138 copies/mL. A negative result does not preclude SARS-Cov-2 infection and should not be used as the sole basis for treatment or other patient management decisions. A negative result may occur with  improper specimen collection/handling, submission of specimen other than nasopharyngeal swab, presence of viral mutation(s) within the areas targeted by this assay, and inadequate number of viral copies(<138 copies/mL). A negative result must be combined with clinical observations, patient history, and epidemiological information. The expected result is Negative.  Fact Sheet for Patients:  EntrepreneurPulse.com.au  Fact Sheet for Healthcare Providers:  IncredibleEmployment.be  This test is no t yet approved or cleared by the [place] FDA and  has been authorized for detection and/or diagnosis of SARS-CoV-2 by FDA under an Emergency Use Authorization (EUA). This EUA will remain  in effect (meaning this test can be used) for the duration of the COVID-19 declaration under Section 564(b)(1) of the Act, 21 U.S.C.section 360bbb-3(b)(1), unless the authorization is terminated  or revoked sooner.       Influenza A by PCR  NEGATIVE NEGATIVE Final   Influenza B by PCR NEGATIVE NEGATIVE Final    Comment: (NOTE) The Xpert Xpress SARS-CoV-2/FLU/RSV plus assay is intended as an aid in the diagnosis of influenza from Nasopharyngeal swab specimens and should not be used as a sole basis for treatment. Nasal washings and aspirates are unacceptable for Xpert Xpress SARS-CoV-2/FLU/RSV testing.  Fact Sheet for Patients: EntrepreneurPulse.com.au  Fact Sheet for Healthcare Providers: IncredibleEmployment.be  This test is not yet approved or cleared by the [place] FDA and has been authorized for detection and/or diagnosis of SARS-CoV-2 by FDA under an Emergency Use Authorization (  EUA). This EUA will remain in effect (meaning this test can be used) for the duration of the COVID-19 declaration under Section 564(b)(1) of the Act, 21 U.S.C. section 360bbb-3(b)(1), unless the authorization is terminated or revoked.  Performed at [facility], [place] [address] [place], [place] [address]   MRSA PCR Screening     Status: None   Collection Time: 02/03/21 12:15 AM   Specimen: Nasal Mucosa; Nasopharyngeal  Result Value Ref Range Status   MRSA by PCR NEGATIVE NEGATIVE Final    Comment:        The GeneXpert MRSA Assay (FDA approved for NASAL specimens only), is one component of a comprehensive MRSA colonization surveillance program. It is not intended to diagnose MRSA infection nor to guide or monitor treatment for MRSA infections. Performed at [facility], [place] [address], [place], [place] [address]   Aerobic/Anaerobic Culture w Gram Stain (surgical/deep wound)     Status: None (Preliminary result)   Collection Time: 02/03/21 11:23 AM   Specimen: PATH Other; Tissue  Result Value Ref Range Status   Specimen Description TISSUE LEFT FOOT  Final   Special Requests LEFT FOOT TISSUE  Final   Gram Stain   Final    RARE WBC PRESENT, PREDOMINANTLY PMN NO ORGANISMS  SEEN    Culture   Final    RARE ENTEROCOCCUS FAECALIS RARE STAPHYLOCOCCUS CAPITIS RARE STAPHYLOCOCCUS AUREUS SUSCEPTIBILITIES TO FOLLOW Performed at [facility], [place] [address], [place], [place] [address]    Report Status PENDING  Incomplete   *Note: Due to a large number of results and/or encounters for the requested time period, some results have not been displayed. A complete set of results can be found in Results Review.    Xrays left foot 02/03/21 IMPRESSION: Status post partial amputation of the first proximal phalanx, fifth metatarsal bone and fifth proximal phalanx. No evidence of surgical complicating feature.  Assessment and Plan:  Problem List Items Addressed This Visit      Musculoskeletal and Integument   * (Principal) Cellulitis of left foot - Primary    Other Visit Diagnoses    S/P foot surgery       Relevant Orders   DG Foot Complete Left (Completed)      -Patient seen and evaluated at bedside -Chart reviewed -Dressing change performed; applied betadine and dry dressing  -Advised patient to make sure to keep dressing clean, dry, and intact to Left foot; nursing to reinforce with dry dressing if there is any bloody strike-through or change as ordered if significant bleeding occurs  -Continue with rest and elevation to prevent against pain and to assist with edema control -Continue with broad-spectrum antibiotics recommend to tailor based on intraoperative cultures; pending results  -Patient may weight-bear to heel with walker and postoperative shoe for bedside and bathroom only and for longer distances as discussed with PT may use wheelchair -Patient stable from podiatry point of view with no other procedures planned -Anticipated plan of care: Discharge to home with oral antibiotics pending culture results with follow-up in office for weekly dressing changes with Dr. Posey Pronto after she is discharged.  Landis Martins, DPM [phone] cell [facility]

## 2021-02-05 NOTE — Progress Notes (Signed)
PROGRESS NOTE   [name]  YPP:[id number] DOB: 1956/01/14 DOA: 01/28/2021 PCP: [name], MD  Brief Narrative:   46 white female Diabetes mellitus type 2 on insulin Hypothyroid Nonischemic cardiomyopathy combined systolic diastolic heart failure status post removal ICD secondary to bacteremia PAD left anterior tibial artery operation 10/06/2018 OSA on CPAP NSTEMI 2019 with MSSA bacteremia status post toe amputation-had TB and lead vegetations status post angio vac and ICD removal [facility] Atrial fibrillation on amiodarone  Nonhealing left foot wound that started in January-previously followed by podiatry in [facility] and then [facility]-Rx about 1 month ago clindamycin for cellulitis ABI 01/30/2021 = left ABI 0.85 monophasic and TBI 0.14 Right lower extremity ABI is 1.23 with triphasic flow MRI = osteomyelitis head fifth metatarsal base proximal phalanx--had initial surgery with podiatry and then  underwent CO2 angiogram 4/29 under Dr. Scot Dock revision of surgery 4/30 Dr. Posey Pronto    Hospital-Problem based course Osteomyelitis left foot wound Failed outpatient management on clindamycin D/W ID--de-escalate from cefepime vancomycin to Augmentin in am for wounds growing Enterococcus/Staph  BC x 2  --5 d. from admit neg from podiatrist S/p revision of surgery 4/30 Foot Drop LLE Can be heel weightbearing but difficult to ambulate with current postop boot Secondary to diabetic polyneuropathy PT recommends home health Peripheral vascular disease CO2 angiogram performed 4/29---In-stent stenosis 3 places L SFA  NICM--removal of ICD's for bacteremia MSSA in the past Blood culture negative X2 from 4/24--- no bacteremia-has negative cultures DC monitors as has been stable Imdur 30 Coreg 3.125 Hold demadex/losartan today PAD status post lower extremity interventions in the past ABIs as above Atrial fibrillation CHADS2 score >4 on amiodarone Continue amiodarone 200 at bedtime  [OP LFT/TSH in 3 mo] Continue Plavix 75, aspirin 81  OSA on CPAP Resume cpap DM TY 2 AiC 8.1 with neuropathy nephropathy and CKD 4 CBG ranging 1 46-2 56 sliding scale coverage at this time Continue Lantus 22 Mild hyperkalemia Hold losartan 12.5 and repeat a.m. labs BMI 41 Requires further outpatient discussion of the same   DVT prophylaxis: Heparin Code Status: Full Family Communication: none present--lives with mother Disposition:  Status is: Inpatient  Remains inpatient appropriate because:Hemodynamically unstable, Persistent severe electrolyte disturbances, Altered mental status and Ongoing diagnostic testing needed not appropriate for outpatient work up   Dispo: The patient is from: Home              Anticipated d/c is to: To be determined--await EVal by therapy once patient finished all procedures              Patient currently is not medically stable to d/c.   Difficult to place patient No   Consultants:   Podiatrist  Vascular surgeon  Procedures:   Antimicrobials:  Vancomycin and cefepime since 4/24   Subjective:  Doing fair No distress no pain Eating drinking fairly well   Objective: Vitals:   02/04/21 2318 02/05/21 0522 02/05/21 0611 02/05/21 0900  BP: (!) 110/46 135/60  115/75  Pulse:  (!) 59  (!) 58  Resp: 18 18  15   Temp: 97.9 F (36.6 C) 98.1 F (36.7 C)  98.6 F (37 C)  TempSrc: Oral Oral  Oral  SpO2: 96% 96%  98%  Weight:   127.7 kg   Height:        Intake/Output Summary (Last 24 hours) at 02/05/2021 1502 Last data filed at 02/05/2021 0523 Gross per 24 hour  Intake 300 ml  Output 1450 ml  Net -  1150 ml   Filed Weights   01/28/21 1941 02/02/21 1944 02/05/21 0611  Weight: 122.7 kg 122.8 kg 127.7 kg    Examination:  No icterus no pallor Chest clear posteriorly and anteriorly Obese nontender nondistended Moving 4 limbs equally Wound bandaged  Data Reviewed: personally reviewed   Potassium 5.1-->5.0 CO2 23 BUNs/creatinine  49/2.2-->56/2.3 Hemoglobin 10.6 platelet 188 WBC 8.9  Radiology Studies: Initial surgery Dr. Posey Pronto  1.  Left foot incision and drainage washout debridement of the hallux and fifth metatarsal 2.  Bone biopsy of the left foot (hallux and fifth metatarsal) 3.  Packed primarily open 4.  Sesamoidectomy  VVS PROCEDURE 4/29:     1.  Conscious sedation 2.  Ultrasound-guided access to the right common femoral artery 3.  CO2 aortogram 4.  Selective catheterization of the left external iliac artery with left lower extremity runoff using CO2 (second order catheterization) 5.  Selective catheterization of the left superficial femoral artery (third order catheterization) 6.  Angioplasty of 3 separate in-stent stenoses in the left superficial femoral artery.  Scheduled Meds: . amiodarone  200 mg Oral q morning  . amoxicillin-clavulanate  1 tablet Oral Q12H  . aspirin EC  81 mg Oral Daily  . atorvastatin  10 mg Oral Daily  . carvedilol  3.125 mg Oral Daily  . clopidogrel  75 mg Oral Daily  . DULoxetine  30 mg Oral QHS  . ferrous sulfate  325 mg Oral Q breakfast  . gabapentin  400 mg Oral BID  . heparin  5,000 Units Subcutaneous Q8H  . insulin aspart  0-20 Units Subcutaneous TID WC  . insulin aspart  0-5 Units Subcutaneous QHS  . insulin glargine  22 Units Subcutaneous QHS  . isosorbide mononitrate  30 mg Oral Daily  . levothyroxine  50 mcg Oral Q0600  . sodium chloride flush  3 mL Intravenous Q12H  . traMADol  50 mg Oral QHS   Continuous Infusions: . sodium chloride    . vancomycin 1,500 mg (02/03/21 1952)     LOS: 8 days   Time spent: 15 minutes  Nita Sells, MD [facility] To contact the attending provider between 7A-7P or the covering provider during after hours 7P-7A, please log into the web site [email or web address] and access using universal [place] password for that web site. If you do not have the password, please call the hospital operator.  02/05/2021, 3:02 PM

## 2021-02-05 NOTE — Progress Notes (Signed)
Pharmacy Antibiotic Note  [name] is a 65 y.o. female admitted on 01/28/2021 with worsening left foot infection and possible osteomyelitis.  Pharmacy has been consulted for Vancomycin dosing. Also on Cefepime.  Was on Clindamycin PTA.  S/p I + D with arteriogram.  Scr up to 2.33 today.  Plan: Continue Vancomycin 1500 mg IV Q 48 hrs - will check trough level before tonight's dose. (Goal AUC 400-550, Expected AUC: 468, SCr used: 2.21) Continue Cefepime 2 grams iv Q 12 hours  Height: 5\' 8"  (172.7 cm) Weight: 127.7 kg (281 lb 8.4 oz) IBW/kg (Calculated) : 63.9  Temp (24hrs), Avg:98.2 F (36.8 C), Min:97.6 F (36.4 C), Max:99.1 F (37.3 C)  Recent Labs  Lab 01/30/21 0250 02/01/21 0440 02/02/21 0551 02/03/21 0049 02/04/21 0102 02/05/21 0143  WBC 7.9 8.7 6.9  --  8.9  --   CREATININE 2.21* 2.13* 2.21* 1.88*  1.86* 2.24* 2.33*    Estimated Creatinine Clearance: 33.5 mL/min (A) (by C-G formula based on SCr of 2.33 mg/dL (H)).    No Known Allergies   Cefepime 4/24>>  Zosyn x 1 on 4/24  Vanc 4/24 >>   4/24 blood x 2: neg  4/24 COVID and flu: negative  4/30 L foot tissue - rare enterococcus, rare staph capitis > reincubated  [name], [name], [facility] Clinical Pharmacist  02/05/2021 8:39 AM   [facility] pharmacy phone numbers are listed on [email or web address]

## 2021-02-05 NOTE — Plan of Care (Signed)
  Problem: Clinical Measurements: Goal: Respiratory complications will improve Outcome: Progressing Goal: Cardiovascular complication will be avoided Outcome: Progressing   

## 2021-02-06 ENCOUNTER — Other Ambulatory Visit: Payer: Self-pay | Admitting: Cardiovascular Disease

## 2021-02-06 ENCOUNTER — Telehealth: Payer: Self-pay | Admitting: Family Medicine

## 2021-02-06 LAB — COMPREHENSIVE METABOLIC PANEL
ALT: 30 U/L (ref 0–44)
AST: 32 U/L (ref 15–41)
Albumin: 3.1 g/dL — ABNORMAL LOW (ref 3.5–5.0)
Alkaline Phosphatase: 58 U/L (ref 38–126)
Anion gap: 9 (ref 5–15)
BUN: 51 mg/dL — ABNORMAL HIGH (ref 8–23)
CO2: 24 mmol/L (ref 22–32)
Calcium: 9 mg/dL (ref 8.9–10.3)
Chloride: 104 mmol/L (ref 98–111)
Creatinine, Ser: 2.12 mg/dL — ABNORMAL HIGH (ref 0.44–1.00)
GFR, Estimated: 25 mL/min — ABNORMAL LOW (ref >60)
Glucose, Bld: 79 mg/dL (ref 70–99)
Potassium: 4.3 mmol/L (ref 3.5–5.1)
Sodium: 137 mmol/L (ref 135–145)
Total Bilirubin: 0.4 mg/dL (ref 0.3–1.2)
Total Protein: 7.2 g/dL (ref 6.5–8.1)

## 2021-02-06 LAB — CBC WITH DIFFERENTIAL/PLATELET
Abs Immature Granulocytes: 0.06 10*3/uL (ref 0.00–0.07)
Basophils Absolute: 0.1 10*3/uL (ref 0.0–0.1)
Basophils Relative: 1 %
Eosinophils Absolute: 0.2 10*3/uL (ref 0.0–0.5)
Eosinophils Relative: 2 %
HCT: 36.7 % (ref 36.0–46.0)
Hemoglobin: 11.8 g/dL — ABNORMAL LOW (ref 12.0–15.0)
Immature Granulocytes: 1 %
Lymphocytes Relative: 25 %
Lymphs Abs: 1.9 10*3/uL (ref 0.7–4.0)
MCH: 31 pg (ref 26.0–34.0)
MCHC: 32.2 g/dL (ref 30.0–36.0)
MCV: 96.3 fL (ref 80.0–100.0)
Monocytes Absolute: 0.6 10*3/uL (ref 0.1–1.0)
Monocytes Relative: 8 %
Neutro Abs: 5 10*3/uL (ref 1.7–7.7)
Neutrophils Relative %: 63 %
Platelets: 196 10*3/uL (ref 150–400)
RBC: 3.81 MIL/uL — ABNORMAL LOW (ref 3.87–5.11)
RDW: 14.5 % (ref 11.5–15.5)
WBC: 7.8 10*3/uL (ref 4.0–10.5)
nRBC: 0 % (ref 0.0–0.2)

## 2021-02-06 LAB — GLUCOSE, CAPILLARY
Glucose-Capillary: 141 mg/dL — ABNORMAL HIGH (ref 70–99)
Glucose-Capillary: 137 mg/dL — ABNORMAL HIGH (ref 70–99)
Glucose-Capillary: 301 mg/dL — ABNORMAL HIGH (ref 70–99)

## 2021-02-06 LAB — SURGICAL PATHOLOGY

## 2021-02-06 MED ORDER — AMOXICILLIN-POT CLAVULANATE 500-125 MG PO TABS: 1.0000 | ORAL_TABLET | Freq: Two times a day (BID) | ORAL | 0 refills | Status: DC

## 2021-02-06 MED ORDER — ATORVASTATIN CALCIUM 10 MG PO TABS: 10.0000 mg | ORAL_TABLET | Freq: Every day | ORAL | 11 refills | Status: DC

## 2021-02-06 MED ORDER — ASPIRIN 81 MG PO TBEC: 81.0000 mg | DELAYED_RELEASE_TABLET | Freq: Every day | ORAL | 11 refills | Status: DC

## 2021-02-06 NOTE — Progress Notes (Signed)
W/C arrived and patient assisted to private vehicle by staff.  Discharge summary reviewed.  Medication changes reviewed.  IV access discontinued and patient assisted in dressing.

## 2021-02-06 NOTE — Telephone Encounter (Signed)
See if patient can get added to the scheduled at 1:30 on Friday of this week.  Hospital discharge.  We can do labs at the visit.  Thanks.

## 2021-02-06 NOTE — Progress Notes (Signed)
Left foot noted bleeding great toe and lateral .  Bandage changed without difficulty.  No active bleeding seen during bandage change.

## 2021-02-06 NOTE — Telephone Encounter (Signed)
Please call pt to schedule appt with Dr. Rockey Situ for refills. Last saw Dr. Rockey Situ in January 2021. Thank you!

## 2021-02-06 NOTE — Progress Notes (Signed)
Mobility Specialist: Progress Note   02/06/21 1247  Mobility  Activity Ambulated in hall  Level of Assistance Independent after set-up  Assistive Device Front wheel walker  Distance Ambulated (ft) 110 ft  Mobility Response Tolerated well  Mobility performed by Mobility specialist  $Mobility charge 1 Mobility   Post-Mobility: 76 HR, 95% SpO2  Pt asx during ambulation. Pt sitting EOB with call bell at her side and is hopeful for discharge soon.   [facility] Victorina Kable Mobility Specialist Mobility Specialist Phone: [phone]

## 2021-02-06 NOTE — Discharge Summary (Addendum)
Physician Discharge Summary  Forrestine Lecrone Tiger GDJ:[id number] DOB: [birth year] DOA: 01/28/2021  PCP: [name], MD  Admit date: 01/28/2021 Discharge date: 02/06/2021  Time spent: 36 minutes  Recommendations for Outpatient Follow-up:  1. as outpatient get labs in 3 days and address medication changes on the [facility]--- fluid status-- and resume Demadex if needed 2. home health PT OT and RN in an outpatient setting 3. Final final cultures in the outpatient setting and adjust antibiotics  Discharge Diagnoses:  MAIN problem for hospitalization   Diabetic foot wound status post amputation  Please see below for itemized issues addressed in HOpsital- refer to other progress notes for clarity if needed  Discharge Condition: Improved  Diet recommendation: Diabetic  Filed Weights   02/05/21 0611 02/06/21 0407 02/06/21 0610  Weight: 127.7 kg 130.6 kg 130.6 kg    History of present illness:  65 white female Diabetes mellitus type 2 on insulin Hypothyroid Nonischemic cardiomyopathy combined systolic diastolic heart failure status post removal ICD secondary to bacteremia PAD left anterior tibial artery operation 10/06/2018 OSA on CPAP NSTEMI 2019 with MSSA bacteremia status post toe amputation-had TB and lead vegetations status post angio vac and ICD removal [facility] Atrial fibrillation on amiodarone  Nonhealing left foot wound that started in January-previously followed by podiatry in [facility] and then [facility]-Rx about 1 month ago clindamycin for cellulitis ABI 01/30/2021 = left ABI 0.85 monophasic and TBI 0.14 Right lower extremity ABI is 1.23 with triphasic flow MRI = osteomyelitis head fifth metatarsal base proximal phalanx--had initial surgery with podiatry and then  underwent CO2 angiogram 4/29 under Dr. Scot Dock revision of surgery 4/30 Dr. Sherwood Gambler Course:  Osteomyelitis left foot wound Failed outpatient management on clindamycin D/W ID--de-escalate from cefepime  vancomycin to Augmentin growing Enterococcus/Staph --- BC x 2  --5 d. from admit neg from podiatrist S/p revision of surgery 4/30 Has stabilized Foot Drop LLE heel weightbearing Secondary to diabetic polyneuropathy PT recommends home health--add aide Rn in addition Peripheral vascular disease CO2 angiogram performed 4/29---In-stent stenosis 3 places L SFA  NICM--removal of ICD's for bacteremia MSSA in the past Blood culture negative X2 from 4/24--- no bacteremia-has negative cultures DC monitors as has been stable Imdur 30 Coreg 3.125 Hold demadex/losartan until seen in the OP PAD status post lower extremity interventions in the past ABIs as above Atrial fibrillation CHADS2 score >4 on amiodarone Continue amiodarone 200 at bedtime [OP LFT/TSH in 3 mo] Continue Plavix 75, aspirin 81  OSA on CPAP Resume cpap DM TY 2 AiC 8.1 with neuropathy nephropathy and CKD 4 CBG controlled in hospital Continue Lantus 22 Mild hyperkalemia Hold losartan 12.5 until OP seen BMI 41 Requires further outpatient discussion of the same  Radiology Studies: Initial surgery Dr. Posey Pronto 1.Left foot incision and drainage washout debridement of the hallux and fifth metatarsal 2.Bone biopsy of the left foot (hallux and fifth metatarsal) 3.Packed primarily open 4.Sesamoidectomy  VVS PROCEDURE 4/29:   1.Conscious sedation 2.Ultrasound-guided access to the right common femoral artery 3.CO2 aortogram 4.Selective catheterization of the left external iliac artery with left lower extremity runoff using CO2 (second order catheterization) 5.Selective catheterization of the left superficial femoral artery (third order catheterization) 6.Angioplasty of 3 separate in-stent stenoses in the left superficial femoral artery.  surgery 4/30 Pre-operative diagnosis: abscess left foot and osteomyelitis Post-operative diagnosis: same Procedure:1.  Bone resection of the base of the proximal  phalanx 2.  Delayed primary closure 3.  Bone resection of the fifth metatarsal head and base  of the proximal phalanx of the fifth.  Discharge Exam: Vitals:   02/06/21 0407 02/06/21 0743  BP: (!) 124/58 120/65  Pulse: 62 68  Resp: 17 15  Temp: 98.2 F (36.8 C) 98.4 F (36.9 C)  SpO2: 96% 98%    Subj on day of d/c   Awake coherent no distress eating drinking no pain no fever Abdomen soft nontender no rebound  General Exam on discharge  EOMI NCAT no focal deficit Mallampati 4 neck No icterus no pallor S1-S2 no murmur Chest clear no added sound no rales no rhonchi No lower extremity edema Wounds not examined today Neurologically intact moving all 4 limbs grossly  Discharge Instructions   Discharge Instructions    Diet - low sodium heart healthy   Complete by: As directed    Discharge instructions   Complete by: As directed    Please make sure you look at your meds carefully as Well. I would recommend that you get lab work at Dr. Elveria Rising office and ensure that you hold off on your losartan and your Demadex until you get the labs done It also would be a good idea to check your weight daily at home and if you gain more than 3 or 4 pounds in several days let him know at the same time when you get your lab so that he can resume your fluid medication  you were diagnosed with an infection in your foot--this can be treated with oral antibiotics which will take care of it but you need close follow-up with Dr. Posey Pronto who will see you in the office [I will CC both your doctor]  Good luck and take care   Discharge wound care:   Complete by: As directed    02/05/21 1347   Wound care  Every shift      Comments: Left foot: Keep dressing clean, dry, and intact to left foot, if there is bleeding or strike through may re-inforce with dry dressing but if significant bleeding may change dressing apply betadine to incision and 4x4, kerlix, ace wrap, PRN   Increase activity slowly   Complete  by: As directed      Allergies as of 02/06/2021   No Known Allergies     Medication List    STOP taking these medications   clindamycin 300 MG capsule Commonly known as: CLEOCIN   empagliflozin 10 MG Tabs tablet Commonly known as: Jardiance   gentamicin cream 0.1 % Commonly known as: GARAMYCIN   losartan 25 MG tablet Commonly known as: COZAAR   OneTouch Ultra test strip Generic drug: glucose blood   rosuvastatin 10 MG tablet Commonly known as: CRESTOR   torsemide 20 MG tablet Commonly known as: DEMADEX     TAKE these medications   amiodarone 200 MG tablet Commonly known as: PACERONE Take 1 tablet (200 mg total) by mouth daily. What changed: when to take this   amoxicillin-clavulanate 500-125 MG tablet Commonly known as: AUGMENTIN Take 1 tablet (500 mg total) by mouth every 12 (twelve) hours.   aspirin 81 MG EC tablet Take 1 tablet (81 mg total) by mouth daily. Swallow whole. Start taking on: Feb 07, 2021   atorvastatin 10 MG tablet Commonly known as: LIPITOR Take 1 tablet (10 mg total) by mouth daily. Start taking on: Feb 07, 2021   calcitRIOL 0.5 MCG capsule Commonly known as: ROCALTROL Take 0.5 mcg by mouth every morning.   Calcium Carbonate-Vitamin D3 600-400 MG-UNIT Tabs Take 1 tablet by mouth 2 (  two) times a day.   carvedilol 3.125 MG tablet Commonly known as: COREG Take 3.125 mg by mouth every morning.   clopidogrel 75 MG tablet Commonly known as: PLAVIX TAKE 1 TABLET BY MOUTH ONCE DAILY What changed: when to take this   DULoxetine 30 MG capsule Commonly known as: Cymbalta Take 1 capsule (30 mg total) by mouth at bedtime.   ferrous sulfate 325 (65 FE) MG tablet Take 325 mg by mouth every morning.   Fish Oil 1000 MG Caps Take 1,000 mg by mouth 2 (two) times a day.   gabapentin 600 MG tablet Commonly known as: NEURONTIN TAKE 1 TABLET BY IN THE MORNING AND 2 ATBEDTIME What changed: See the new instructions.   isosorbide mononitrate 30  MG 24 hr tablet Commonly known as: IMDUR Take 1 tablet (30 mg total) by mouth daily. Needs appt What changed:   when to take this  additional instructions   Lantus SoloStar 100 UNIT/ML Solostar Pen Generic drug: insulin glargine INJECT 22 UNITS UNDER THE SKIN DAILY. What changed: See the new instructions.   levothyroxine 50 MCG tablet Commonly known as: SYNTHROID TAKE 1 TABLET BY MOUTH ONCE DAILY BEFOREBREAKFAST What changed: See the new instructions.   multivitamin with minerals Tabs tablet Take 1 tablet by mouth every morning.   nitroGLYCERIN 0.4 MG SL tablet Commonly known as: NITROSTAT Place 1 tablet (0.4 mg total) under the tongue every 5 (five) minutes as needed for chest pain.   NovoLOG FlexPen 100 UNIT/ML FlexPen Generic drug: insulin aspart Inject 5 Units into the skin 2 (two) times daily with a meal. What changed: when to take this   Pen Needles 32G X 6 MM Misc 1 each by Does not apply route 4 (four) times daily.   traMADol 50 MG tablet Commonly known as: ULTRAM Take 1-2 tablets (50-100 mg total) by mouth at bedtime as needed. What changed:   how much to take  when to take this            Discharge Care Instructions  (From admission, onward)         Start     Ordered   02/06/21 0000  Discharge wound care:       Comments: 02/05/21 1347   Wound care  Every shift      Comments: Left foot: Keep dressing clean, dry, and intact to left foot, if there is bleeding or strike through may re-inforce with dry dressing but if significant bleeding may change dressing apply betadine to incision and 4x4, kerlix, ace wrap, PRN   02/06/21 0911         No Known Allergies  Follow-up Information    [facility] Follow up in 1 month(s).   Why: The office will contact patient with appointment (sent) Contact information: [address] [place] [phone]               The results of significant diagnostics  from this hospitalization (including imaging, microbiology, ancillary and laboratory) are listed below for reference.    Significant Diagnostic Studies: MR FOOT LEFT WO CONTRAST  Result Date: 01/30/2021 CLINICAL DATA:  Diabetic patient with a left foot ulcer between the first and second metatarsal heads. EXAM: MRI OF THE LEFT FOOT WITHOUT CONTRAST TECHNIQUE: Multiplanar, multisequence MR imaging of the left foot was performed. No intravenous contrast was administered. COMPARISON:  Plain films of the left great toe 01/08/2021. MRI of the left forefoot 11/27/2020. FINDINGS: Bones/Joint/Cartilage Since the prior  exam, patient has developed intense marrow edema consistent with osteomyelitis in the medial and lateral sesamoids of the first MTP joint and marrow edema throughout almost the entire proximal phalanx of the great toe, worst in the proximal 1 cm of the phalanx. The patient also has new marrow edema in the lateral aspect of the distal 1.4 cm of the fifth metatarsal and proximal 1 cm of the proximal phalanx of the little toe. Midfoot osteoarthritis with subchondral cyst formation at the articulation of the navicular and middle and medial cuneiforms is noted. Bone marrow signal is otherwise negative. Ligaments Intact. Muscles and Tendons No intramuscular fluid collection. There is marked atrophy of intrinsic musculature the foot. Soft tissues There is a skin ulceration on the plantar surface of the foot at the level of the base of the proximal phalanx of the great toe. An underlying heterogeneous fluid collection measures 1.7 cm long by 1 cm craniocaudal by 1 cm transverse. There is some subcutaneous edema about the foot. Subtle focus of skin irregularity at the level of the head of the fifth metatarsal is worrisome for an ulceration. IMPRESSION: Skin ulceration on the plantar surface of the foot at the level of the base the proximal phalanx of the great toe with an underlying heterogeneous fluid collection  worrisome for abscess. Marrow edema in the medial and lateral sesamoids and proximal phalanx of the great toe as described above is consistent with osteomyelitis and new since the prior MRI. Osteomyelitis in the periphery of the head of the fifth metatarsal and base of the proximal phalanx of the little toe is new since the prior MRI and most consistent with osteomyelitis. There appears to be a skin ulceration adjacent to the fifth metatarsal without underlying abscess. Subcutaneous edema in the dorsal soft tissues of the foot could be due to dependent change or cellulitis. Electronically Signed   By: Inge Rise M.D.   On: 01/30/2021 14:21   PERIPHERAL VASCULAR CATHETERIZATION  Result Date: 02/02/2021 PATIENT: OLIVIYA GILKISON      MRN: [id number] DOB: [birth year]    DATE OF PROCEDURE: 02/02/2021 INDICATIONS:  Laketa Sandoz Searing is a 65 y.o. female who presented with a nonhealing wound of the left foot.  Patient is undergone previous angioplasty and stenting in the left lower extremity in 2019 at [facility].  She had dampened monophasic signals in the left foot and was felt to be at high risk for limb loss.  She presents for arteriography PROCEDURE:  1.  Conscious sedation 2.  Ultrasound-guided access to the right common femoral artery 3.  CO2 aortogram 4.  Selective catheterization of the left external iliac artery with left lower extremity runoff using CO2 (second order catheterization) 5.  Selective catheterization of the left superficial femoral artery (third order catheterization) 6.  Angioplasty of 3 separate in-stent stenoses in the left superficial femoral artery. SURGEON: Judeth Cornfield. Scot Dock, MD, FACS ANESTHESIA: Local with sedation EBL: Minimal TECHNIQUE: The patient was brought to the peripheral vascular lab and was sedated. The period of conscious sedation was 99 minutes.  During that time period, I was present face-to-face 100% of the time.  The patient was administered 1  mg of Versed and 50 mcg of fentanyl. The patient's heart rate, blood pressure, and oxygen saturation were monitored by the nurse continuously during the procedure. Both groins were prepped and draped in the usual sterile fashion.  Under ultrasound guidance, after the skin was anesthetized, I cannulated the right common femoral artery with  a micropuncture needle and a micropuncture sheath was introduced over a wire.  This was exchanged for a 5 [place] sheath over a Bentson wire.  By ultrasound the femoral artery was patent. A real-time image was obtained and sent to the server. The pigtail catheter was positioned the L1 vertebral body and flush aortogram obtained using CO2.  The catheter was then positioned above the bifurcation and exchanged for a crossover catheter which was positioned into the left common iliac artery.  A wire was advanced into the external iliac artery and the crossover catheter exchanged for a straight catheter.  Selective left external iliac arteriogram was obtained with CO2 with left lower extremity runoff using staged images.  This demonstrated several areas of in-stent stenoses in her previously placed superficial femoral artery stents.  I elected to address these with angioplasty. I advanced a Glidewire through the stents in the left leg and through the stenoses and then advanced a straight catheter over the wire.  I then placed a Rosen wire for support and exchanged the 5 French femoral sheath on the right for a 6 [place] destination sheath which was positioned into the left superficial femoral artery.  Further films were obtained which demonstrated an in-stent stenosis in the proximal stent in the proximal superficial femoral artery.  I dressed this with balloon angioplasty using a 4 mm x 2 cm balloon and then subsequently a 5 mm x 2 cm balloon with a good result. There was a longer stent in the superficial femoral artery below that with the stenosis at the top of the stent and also at the  distal aspect of the stent in the adductor canal.  This was addressed with a 4 mm x 2 cm balloon in both areas with a good result. A total of 10 cc of contrast was used to study the tibial vessels and also to show the completion resolved after the PTA of the in-stent stenoses FINDINGS: 1.  Single renal arteries bilaterally with no significant renal artery stenosis.  The infrarenal aorta, bilateral common iliac arteries and bilateral external iliac arteries are patent. 2.  On the left side the common femoral and deep femoral artery are patent.  The superficial femoral artery is patent.  The proximal stent in the proximal superficial femoral artery had an in-stent stenosis producing an approximately 60% narrowing which was addressed with angioplasty with minimal residual narrowing.  The longer SFA stent distal to this had a stenosis at the top of the stent producing an approximately 60% stenosis and an additional area of approximately 70% stenosis in the distal stent both of which were successfully addressed with angioplasty with minimal residual stenosis.  There is some narrowing in the popliteal artery below the popliteal stent.  I thought this would be associated with higher risk and given that she had excellent runoff with dominant runoff being the anterior tibial and posterior tibial artery I did not address this at this time. CLINICAL NOTE: Overall I think she has reasonable circulation to the left foot and hopefully will have adequate circulation to heal her wound.  I I can follow her vascular issues as an outpatient.  From our standpoint she could be discharged.  She will remain on aspirin, Plavix, and a statin. Deitra Mayo, MD, FACS [facility] DATE OF DICTATION:   02/02/2021  DG Foot Complete Left  Result Date: 02/03/2021 CLINICAL DATA:  Status post surgery, wound infection and cellulitis of LEFT foot. EXAM: LEFT FOOT - COMPLETE  3+ VIEW COMPARISON:  Plain film of  the LEFT foot dated 01/28/2021. FINDINGS: Status post partial amputation of the first proximal phalanx, fifth metatarsal bone and fifth proximal phalanx. Expected postsurgical changes within the surrounding soft tissues. IMPRESSION: Status post partial amputation of the first proximal phalanx, fifth metatarsal bone and fifth proximal phalanx. No evidence of surgical complicating feature. Electronically Signed   By: Franki Cabot M.D.   On: 02/03/2021 14:30   DG Foot Complete Left  Result Date: 01/13/2021 CLINICAL DATA:  A foot ulcer. EXAM: LEFT FOOT - COMPLETE 3+ VIEW COMPARISON:  September 28, 2018 FINDINGS: Interval amputation of the second digit at the level of the metatarsophalangeal joint. Soft tissue swelling and soft tissue emphysema versus open wound is seen between the bases of the first and second digit. Second area of soft tissue swelling and probable wound is seen adjacent to the fifth metatarsophalangeal joint. No cortical osseous destruction to suggest osteomyelitis radiographically. IMPRESSION: 1. Soft tissue swelling and soft tissue emphysema versus open wound between the bases of the first and second digit. 2. Second area of soft tissue swelling and probable wound adjacent to the fifth metatarsophalangeal joint. 3. No cortical osseous destruction to suggest osteomyelitis radiographically. Electronically Signed   By: Fidela Salisbury M.D.   On: 01/13/2021 16:00   DG Toe Great Left  Result Date: 01/28/2021 CLINICAL DATA:  Infection to the great toe for 3 weeks. On antibiotics. EXAM: LEFT GREAT TOE COMPARISON:  01/13/2021 FINDINGS: Previous amputation of the second toe at the metatarsal phalangeal joint level. Soft tissue swelling at the base of the first toe laterally, extending into the space between the first and second toes. Soft tissue gas is present. Soft tissue swelling and gas or increasing since previous study, likely representing expanding abscess. No cortical erosion or bone  sclerosis to suggest osteomyelitis. Degenerative changes in the intertarsal joints. IMPRESSION: 1. Soft tissue swelling and gas at the base of the first toe, increasing since prior study, likely representing expanding abscess. 2. No radiographic evidence of osteomyelitis. 3. Previous amputation of the second toe. Electronically Signed   By: Lucienne Capers M.D.   On: 01/28/2021 19:31   VAS [place] ABI WITH/WO TBI  Result Date: 01/29/2021  LOWER EXTREMITY DOPPLER STUDY Patient Name:  KYNDAHL JABLON  Date of Exam:   01/29/2021 Medical Rec #: [id number]          Accession #:    [id number] Date of Birth: [birth year]           Patient Gender: F Patient Age:   64Y Exam Location:  [facility] Procedure:      VAS [place] ABI WITH/WO TBI Referring Phys: [id number] KEVIN P PATEL --------------------------------------------------------------------------------  Indications: Ulceration, and peripheral artery disease. High Risk Factors: Hypertension, Diabetes, prior MI, coronary artery disease,                    prior CVA.  Comparison Study: 10-06-2018 Peripheral vascular balloon angioplasty showed                   heavy disease of distal popliteal artery/trifurcation with                   >90% stenosis of the ATA, diffuse disease of the peroneal, and                   widely patent PTA. Documented successful recanalization. Performing Technologist: Darlin Coco RDMS RVT  Examination Guidelines: A  complete evaluation includes at minimum, Doppler waveform signals and systolic blood pressure reading at the level of bilateral brachial, anterior tibial, and posterior tibial arteries, when vessel segments are accessible. Bilateral testing is considered an integral part of a complete examination. Photoelectric Plethysmograph (PPG) waveforms and toe systolic pressure readings are included as required and additional duplex testing as needed. Limited examinations for reoccurring indications may be performed as noted.  ABI Findings:  +---------+------------------+-----+---------+--------+ Right    Rt Pressure (mmHg)IndexWaveform Comment  +---------+------------------+-----+---------+--------+ Brachial 144                    triphasic         +---------+------------------+-----+---------+--------+ PTA      177               1.23 triphasic         +---------+------------------+-----+---------+--------+ DP       149               1.03 triphasic         +---------+------------------+-----+---------+--------+ Great Toe98                0.68                   +---------+------------------+-----+---------+--------+ +---------+------------------+-----+----------+-------+ Left     Lt Pressure (mmHg)IndexWaveform  Comment +---------+------------------+-----+----------+-------+ Brachial 138                    triphasic         +---------+------------------+-----+----------+-------+ PTA      123               0.85 monophasic        +---------+------------------+-----+----------+-------+ DP       73                0.51 monophasic        +---------+------------------+-----+----------+-------+ Great Toe20                0.14 Abnormal          +---------+------------------+-----+----------+-------+ +-------+-----------+-----------+------------+------------+ ABI/TBIToday's ABIToday's TBIPrevious ABIPrevious TBI +-------+-----------+-----------+------------+------------+ Right  1.23       0.68                                +-------+-----------+-----------+------------+------------+ Left   0.85       0.14                                +-------+-----------+-----------+------------+------------+  Summary: Right: Resting right ankle-brachial index is within normal range. No evidence of significant right lower extremity arterial disease. Left: Resting left ankle-brachial index indicates mild left lower extremity arterial disease. The left toe-brachial index is abnormal. LT Great toe  pressure = 20 mmHg.  *See table(s) above for measurements and observations.  Electronically signed by Ruta Hinds MD on 01/29/2021 at 4:08:15 PM.    Final     Microbiology: Recent Results (from the past 240 hour(s))  Blood culture (routine x 2)     Status: None   Collection Time: 01/28/21  8:41 PM   Specimen: BLOOD RIGHT WRIST  Result Value Ref Range Status   Specimen Description BLOOD RIGHT WRIST  Final   Special Requests   Final    BOTTLES DRAWN AEROBIC AND ANAEROBIC Blood Culture adequate volume   Culture   Final    NO GROWTH 5  DAYS Performed at [facility], [place] [address] [place], [place] [address]    Report Status 02/02/2021 FINAL  Final  Blood culture (routine x 2)     Status: None   Collection Time: 01/28/21  8:55 PM   Specimen: BLOOD LEFT ARM  Result Value Ref Range Status   Specimen Description BLOOD LEFT ARM  Final   Special Requests   Final    BOTTLES DRAWN AEROBIC AND ANAEROBIC Blood Culture adequate volume   Culture   Final    NO GROWTH 5 DAYS Performed at [facility], [place] [address], [place], [place] [address]    Report Status 02/02/2021 FINAL  Final  Resp Panel by RT-PCR (Flu A&B, Covid) Nasopharyngeal Swab     Status: None   Collection Time: 01/28/21  9:39 PM   Specimen: Nasopharyngeal Swab; Nasopharyngeal(NP) swabs in vial transport medium  Result Value Ref Range Status   SARS Coronavirus 2 by RT PCR NEGATIVE NEGATIVE Final    Comment: (NOTE) SARS-CoV-2 target nucleic acids are NOT DETECTED.  The SARS-CoV-2 RNA is generally detectable in upper respiratory specimens during the acute phase of infection. The lowest concentration of SARS-CoV-2 viral copies this assay can detect is 138 copies/mL. A negative result does not preclude SARS-Cov-2 infection and should not be used as the sole basis for treatment or other patient management decisions. A negative result may occur with  improper specimen collection/handling, submission of specimen  other than nasopharyngeal swab, presence of viral mutation(s) within the areas targeted by this assay, and inadequate number of viral copies(<138 copies/mL). A negative result must be combined with clinical observations, patient history, and epidemiological information. The expected result is Negative.  Fact Sheet for Patients:  EntrepreneurPulse.com.au  Fact Sheet for Healthcare Providers:  IncredibleEmployment.be  This test is no t yet approved or cleared by the [place] FDA and  has been authorized for detection and/or diagnosis of SARS-CoV-2 by FDA under an Emergency Use Authorization (EUA). This EUA will remain  in effect (meaning this test can be used) for the duration of the COVID-19 declaration under Section 564(b)(1) of the Act, 21 U.S.C.section 360bbb-3(b)(1), unless the authorization is terminated  or revoked sooner.       Influenza A by PCR NEGATIVE NEGATIVE Final   Influenza B by PCR NEGATIVE NEGATIVE Final    Comment: (NOTE) The Xpert Xpress SARS-CoV-2/FLU/RSV plus assay is intended as an aid in the diagnosis of influenza from Nasopharyngeal swab specimens and should not be used as a sole basis for treatment. Nasal washings and aspirates are unacceptable for Xpert Xpress SARS-CoV-2/FLU/RSV testing.  Fact Sheet for Patients: EntrepreneurPulse.com.au  Fact Sheet for Healthcare Providers: IncredibleEmployment.be  This test is not yet approved or cleared by the [place] FDA and has been authorized for detection and/or diagnosis of SARS-CoV-2 by FDA under an Emergency Use Authorization (EUA). This EUA will remain in effect (meaning this test can be used) for the duration of the COVID-19 declaration under Section 564(b)(1) of the Act, 21 U.S.C. section 360bbb-3(b)(1), unless the authorization is terminated or revoked.  Performed at [facility], [place] [address], [place],  [place] [address]   MRSA PCR Screening     Status: None   Collection Time: 02/03/21 12:15 AM   Specimen: Nasal Mucosa; Nasopharyngeal  Result Value Ref Range Status   MRSA by PCR NEGATIVE NEGATIVE Final    Comment:        The GeneXpert MRSA Assay (FDA approved for  NASAL specimens only), is one component of a comprehensive MRSA colonization surveillance program. It is not intended to diagnose MRSA infection nor to guide or monitor treatment for MRSA infections. Performed at [facility], [place] [address], [place], [place] [address]   Aerobic/Anaerobic Culture w Gram Stain (surgical/deep wound)     Status: None (Preliminary result)   Collection Time: 02/03/21 11:23 AM   Specimen: PATH Other; Tissue  Result Value Ref Range Status   Specimen Description TISSUE LEFT FOOT  Final   Special Requests LEFT FOOT TISSUE  Final   Gram Stain   Final    RARE WBC PRESENT, PREDOMINANTLY PMN NO ORGANISMS SEEN Performed at [facility], [place] [address], [place], [place] [address]    Culture   Final    RARE ENTEROCOCCUS FAECALIS RARE STAPHYLOCOCCUS CAPITIS RARE STAPHYLOCOCCUS AUREUS SUSCEPTIBILITIES TO FOLLOW NO ANAEROBES ISOLATED; CULTURE IN PROGRESS FOR 5 DAYS    Report Status PENDING  Incomplete     Labs: Basic Metabolic Panel: Recent Labs  Lab 02/01/21 0440 02/02/21 0551 02/03/21 0049 02/04/21 0102 02/05/21 0143 02/06/21 0043  NA 137 137 137  138 137 135 137  K 4.7 4.1 4.3  4.3 5.1 5.0 4.3  CL 99 99 105  104 104 104 104  CO2 26 28 22  23 25 23 24   GLUCOSE 427* 204* 370*  374* 273* 224* 79  BUN 42* 47* 40*  41* 49* 56* 51*  CREATININE 2.13* 2.21* 1.88*  1.86* 2.24* 2.33* 2.12*  CALCIUM 8.9 8.7* 8.2*  8.2* 8.5* 8.4* 9.0  PHOS 3.9  --  3.5  --   --   --    Liver Function Tests: Recent Labs  Lab 02/01/21 0440 02/02/21 0551 02/03/21 0049 02/05/21 0143 02/06/21 0043  AST  --  22  --  31 32  ALT  --  21  --  24 30  ALKPHOS  --  53  --  49 58  BILITOT  --  0.6   --  1.0 0.4  PROT  --  6.4*  --  6.1* 7.2  ALBUMIN 2.9* 2.7* 2.6* 2.5* 3.1*   No results for input(s): LIPASE, AMYLASE in the last 168 hours. No results for input(s): AMMONIA in the last 168 hours. CBC: Recent Labs  Lab 02/01/21 0440 02/02/21 0551 02/04/21 0102 02/06/21 0043  WBC 8.7 6.9 8.9 7.8  NEUTROABS 7.2 4.5 7.5 5.0  HGB 10.9* 10.5* 10.6* 11.8*  HCT 33.2* 31.9* 33.8* 36.7  MCV 96.0 95.2 98.0 96.3  PLT 207 176 188 196   Cardiac Enzymes: No results for input(s): CKTOTAL, CKMB, CKMBINDEX, TROPONINI in the last 168 hours. BNP: BNP (last 3 results) Recent Labs    11/27/20 0026 11/28/20 0123 11/29/20 0029  BNP 101.3* 164.0* 230.9*    ProBNP (last 3 results) No results for input(s): PROBNP in the last 8760 hours.  CBG: Recent Labs  Lab 02/05/21 1246 02/05/21 1552 02/05/21 2025 02/06/21 0411 02/06/21 0616  GLUCAP 256* 185* 206* 141* 137*       Signed:  Nita Sells MD   [facility] 02/06/2021, 9:11 AM

## 2021-02-06 NOTE — Care Management Important Message (Signed)
Important Message  Patient Details  Name: KAYDANCE BOWIE MRN: [id number] Date of Birth: [birth year]   Medicare Important Message Given:  Yes     Shelda Altes 02/06/2021, 9:25 AM

## 2021-02-06 NOTE — Progress Notes (Signed)
Physical Therapy Treatment Patient Details Name: [name] MRN: [id number] DOB: 22-Dec-1955 Today's Date: 02/06/2021    History of Present Illness The pt is a 65 yo female presenting 4/24 due to non-healing wound on L foot after 3 weeks of antibiotics. Pt found to have dampened signals in the L foot and underwent arteriography with stant placement in superficial femoral artery on 4/29. I&D of L foot wound on 4/30. PMH includes: MI, osteomyelitis of foot and ankle, CAD, CKD, CHF, HTN, neuropathy, obesity, DM II, multiple toe amputations, and heart caths, and biventricular cardioverter defibrillator implantation.    PT Comments    Pt received in supine, agreeable to therapy session and with good participation and tolerance for transfer and gait/stair training. Pt planning to discharge today so focus mostly on safe stair sequencing and pt performed with min guard with RW on flat surface and standard height step but did verbal/visual review for sequencing with multiple steps and no railing using RW and pt receptive to instruction. Pt given handouts to reinforce stair sequencing, wheelchair over curbs and HEP for LLE. Pt continues to benefit from PT services to progress toward functional mobility goals. Continue to recommend HHPT and wheelchair as pt now reporting she had family check and her old wheelchair was sold.   Follow Up Recommendations  Home health PT;Supervision - Intermittent     Equipment Recommendations  Wheelchair (measurements PT);Wheelchair cushion (measurements PT) (pt no longer has WC, recommending per evaluating therapist states this is necessary (PWB restriction))    Recommendations for Other Services       Precautions / Restrictions Precautions Precautions: Fall Restrictions Weight Bearing Restrictions: Yes LLE Weight Bearing: Partial weight bearing LLE Partial Weight Bearing Percentage or Pounds: PWB through heel only    Mobility  Bed Mobility Overal bed  mobility: Modified Independent             General bed mobility comments: use of bed features    Transfers Overall transfer level: Needs assistance Equipment used: Rolling walker (2 wheeled) Transfers: Sit to/from [employer] Sit to Stand: Min guard Stand pivot transfers: Min guard       General transfer comment: appropriate use of UE's.  Use of ortho shoe, cues for foot placement to prevent forefoot loading; needed x2 attempts to rise and use of momentum strategy/bed height elevated to simulate home setup  Ambulation/Gait Ambulation/Gait assistance: Min guard Gait Distance (Feet): 15 Feet Assistive device: Rolling walker (2 wheeled) Gait Pattern/deviations: Step-to pattern     General Gait Details: stable step to pattern with L heel bias and weight through UE's during L w/bearing. To/from EOB<>step for practicing   Stairs Stairs: Yes Stairs assistance: Min guard Stair Management: With walker;Forwards;Step to pattern Number of Stairs: 3 General stair comments: cues for sequencing with visual demo first, pt with difficulty remembering proper sequencing so printed handout for her; reviewed use of RW on multiple steps as she was performing with platform step in room   Wheelchair Mobility    Modified Rankin (Stroke Patients Only)       Balance Overall balance assessment: Needs assistance Sitting-balance support: No upper extremity supported Sitting balance-Leahy Scale: Good     Standing balance support: Bilateral upper extremity supported Standing balance-Leahy Scale: Poor Standing balance comment: reliant on BUE support due to [facility] restriction                            Cognition Arousal/Alertness: Awake/alert Behavior  During Therapy: WFL for tasks assessed/performed Overall Cognitive Status: Within Functional Limits for tasks assessed                                 General Comments: pleasant, cooperative; needs  reinforcement to remember sequencing for steps (printed handout)      Exercises Other Exercises Other Exercises: pt given supine/standing HEP and instruction on only performing standing exercises on LLE due to L PWB restriction    General Comments        Pertinent Vitals/Pain Pain Assessment: No/denies pain Faces Pain Scale: No hurt Pain Intervention(s): Monitored during session    Home Living                      Prior Function            PT Goals (current goals can now be found in the care plan section) Acute Rehab PT Goals Patient Stated Goal: return home, improve independence PT Goal Formulation: With patient Time For Goal Achievement: 02/17/21 Potential to Achieve Goals: Good Progress towards PT goals: Progressing toward goals    Frequency    Min 3X/week      PT Plan Current plan remains appropriate;Equipment recommendations need to be updated    Co-evaluation              AM-PAC PT "6 Clicks" Mobility   Outcome Measure  Help needed turning from your back to your side while in a flat bed without using bedrails?: None Help needed moving from lying on your back to sitting on the side of a flat bed without using bedrails?: None Help needed moving to and from a bed to a chair (including a wheelchair)?: A Little Help needed standing up from a chair using your arms (e.g., wheelchair or bedside chair)?: A Little Help needed to walk in hospital room?: A Little Help needed climbing 3-5 steps with a railing? : A Little 6 Click Score: 20    End of Session Equipment Utilized During Treatment: Gait belt Activity Tolerance: Patient tolerated treatment well Patient left: with call bell/phone within reach;in bed Nurse Communication: Mobility status;Other (comment) (pt needs wc?) PT Visit Diagnosis: Other abnormalities of gait and mobility (R26.89);Difficulty in walking, not elsewhere classified (R26.2)     Time: 1610-9604 PT Time Calculation (min)  (ACUTE ONLY): 19 min  Charges:  $Gait Training: 8-22 mins                     Alizaya Oshea P., PTA Acute Rehabilitation Services Pager: [phone] Office: [phone]   Dorathy Kinsman Katieann Hungate 02/06/2021, 11:08 AM

## 2021-02-06 NOTE — Progress Notes (Cosign Needed)
   Durable Medical Equipment (From admission, onward)       Start     Ordered  02/06/21 1129   For home use only DME standard manual wheelchair with seat cushion  Once      Comments: Patient suffers from foot wound which impairs their ability to perform daily activities like dressing, feeding, and toileting in the home.  A cane or crutch will not resolve issue with performing activities of daily living. A wheelchair will allow patient to safely perform daily activities. Patient can safely propel the wheelchair in the home or has a caregiver who can provide assistance. Length of need Lifetime. Accessories: elevating leg rests (ELRs), wheel locks, extensions and anti-tippers.  Needs WC cushion as well  02/06/21 1128

## 2021-02-06 NOTE — Telephone Encounter (Signed)
Patient currently admitted to hospital, will call back when discharged

## 2021-02-06 NOTE — TOC Transition Note (Signed)
Transition of Care (TOC) - CM/SW Discharge Note Marvetta Gibbons RN, BSN Transitions of Care Unit 4E- RN Case Manager See Treatment Team for direct phone #    Patient Details  Name: [name] MRN: [id number] Date of Birth: Jun 10, 1956  Transition of Care [facility]) CM/SW Contact:  Dawayne Patricia, RN Phone Number: 02/06/2021, 12:35 PM   Clinical Narrative:    Pt stable for transition home today, orders placed for HHRN/PT/OT/aide. CM spoke with pt at bedside- per conversation pt states she will be going home to stay with her mom-  Address- [place], [place], [place] [address], Phone # confirmed in epic as well as PCP  Discussed [place] and DME needs- choice offered for [facility] needs- per pt she has always used [facility] and would like to use them again, list provided Per CMS guidelines from medicare.gov website with star ratings (copy placed in shadow chart) for pt to review in case [facility] can not accept referral. Call made to [facility] with [place]- unfortunately they are unable to accept for needed services in the [place] area at this time. Pt informed and she has selected [facility] as her second choice.  Call made to [facility] with Alvis Lemmings called for [facility] referral- and referral has been accepted- Alvis Lemmings will f/u with pt for start of care in the home.   During bedside conversation pt shared that she has a RW at home, she thought she also had her w/c however she recently sold her home and just this am found out that the w/c has left behind in the storage building. Pt states she will need a w/c in get into the home. Msg sent to PT to get recommendations on w/c size, as well as to MD for DME order.  DME- W/C order placed per MD- call made to [facility] regarding w/c needs- [facility] to f/u with pt regarding cost and deliver w/c to room prior to discharge.    Final next level of care: [place] Barriers to Discharge: Equipment Delay   Patient Goals and CMS Choice Patient states  their goals for this hospitalization and ongoing recovery are:: return home CMS Medicare.gov Compare Post Acute Care list provided to:: Patient Choice offered to / list presented to : Patient  Discharge Placement               Home with [place]        Discharge Plan and Services   Discharge Planning Services: CM Consult Post Acute Care Choice: [place]          DME Arranged: Wheelchair manual DME Agency: [facility] Date DME Agency Contacted: 02/06/21 Time DME Agency Contacted: 2130 Representative spoke with at DME Agency: [place]: RN,PT,OT [place] Agency: Shanele Nissan Date [place]: 02/06/21 Time [place]: 1140 Representative spoke with at [place]: [place] ([place]) Interventions     Readmission Risk Interventions Readmission Risk Prevention Plan 02/06/2021 04/02/2019 02/18/2019  Transportation Screening Complete Complete Complete  Medication Review Press photographer) Complete Complete Complete  PCP or Specialist appointment within 3-5 days of discharge Complete Complete Complete  HRI or Home Care Consult Complete Complete Complete  SW Recovery Care/Counseling Consult Complete Patient refused Patient refused  Palliative Care Screening Not Applicable Not Applicable Not [place] Not Applicable Not Applicable Not Applicable  Some recent data might be hidden

## 2021-02-07 ENCOUNTER — Telehealth: Payer: Self-pay

## 2021-02-07 NOTE — Telephone Encounter (Signed)
Transition Care Management Follow-up Telephone Call  Date of discharge and from where: 02/06/2021 [name]  How have you been since you were released from the hospital? Doing ok  Any questions or concerns? No  Items Reviewed:  Did the pt receive and understand the discharge instructions provided? Yes   Medications obtained and verified? Yes   Other? No   Any new allergies since your discharge? No   Dietary orders reviewed? Yes  Do you have support at home? Yes   Home Care and Equipment/Supplies: Were home health services ordered? yes If so, what is the name of the agency?   Has the agency set up a time to come to the patient's home? yes Were any new equipment or medical supplies ordered?  No What is the name of the medical supply agency? n/a Were you able to get the supplies/equipment? not applicable Do you have any questions related to the use of the equipment or supplies? No  Functional Questionnaire: (I = Independent and D = Dependent) ADLs: I  Bathing/Dressing- I  Meal Prep- D  Eating- I  Maintaining continence- I  Transferring/Ambulation- I  Managing Meds- I  Follow up appointments reviewed:   PCP Hospital f/u appt confirmed? Yes  Scheduled to see Dr. Damita Dunnings on 02/15/2021 @ 2:30..  Are transportation arrangements needed? No   If their condition worsens, is the pt aware to call PCP or go to the Emergency Dept.? Yes  Was the patient provided with contact information for the PCP's office or ED? Yes  Was to pt encouraged to call back with questions or concerns? Yes

## 2021-02-08 ENCOUNTER — Telehealth: Payer: Self-pay | Admitting: *Deleted

## 2021-02-08 DIAGNOSIS — I130 Hypertensive heart and chronic kidney disease with heart failure and stage 1 through stage 4 chronic kidney disease, or unspecified chronic kidney disease: Secondary | ICD-10-CM | POA: Diagnosis not present

## 2021-02-08 DIAGNOSIS — I428 Other cardiomyopathies: Secondary | ICD-10-CM | POA: Diagnosis not present

## 2021-02-08 DIAGNOSIS — Z89422 Acquired absence of other left toe(s): Secondary | ICD-10-CM | POA: Diagnosis not present

## 2021-02-08 DIAGNOSIS — F32A Depression, unspecified: Secondary | ICD-10-CM | POA: Diagnosis not present

## 2021-02-08 DIAGNOSIS — I447 Left bundle-branch block, unspecified: Secondary | ICD-10-CM | POA: Diagnosis not present

## 2021-02-08 DIAGNOSIS — E785 Hyperlipidemia, unspecified: Secondary | ICD-10-CM | POA: Diagnosis not present

## 2021-02-08 DIAGNOSIS — E039 Hypothyroidism, unspecified: Secondary | ICD-10-CM | POA: Diagnosis not present

## 2021-02-08 DIAGNOSIS — N184 Chronic kidney disease, stage 4 (severe): Secondary | ICD-10-CM | POA: Diagnosis not present

## 2021-02-08 DIAGNOSIS — I70202 Unspecified atherosclerosis of native arteries of extremities, left leg: Secondary | ICD-10-CM | POA: Diagnosis not present

## 2021-02-08 DIAGNOSIS — I2510 Atherosclerotic heart disease of native coronary artery without angina pectoris: Secondary | ICD-10-CM | POA: Diagnosis not present

## 2021-02-08 DIAGNOSIS — I5023 Acute on chronic systolic (congestive) heart failure: Secondary | ICD-10-CM | POA: Diagnosis not present

## 2021-02-08 DIAGNOSIS — D631 Anemia in chronic kidney disease: Secondary | ICD-10-CM | POA: Diagnosis not present

## 2021-02-08 DIAGNOSIS — I255 Ischemic cardiomyopathy: Secondary | ICD-10-CM | POA: Diagnosis not present

## 2021-02-08 DIAGNOSIS — E1151 Type 2 diabetes mellitus with diabetic peripheral angiopathy without gangrene: Secondary | ICD-10-CM | POA: Diagnosis not present

## 2021-02-08 DIAGNOSIS — I252 Old myocardial infarction: Secondary | ICD-10-CM | POA: Diagnosis not present

## 2021-02-08 DIAGNOSIS — Z4801 Encounter for change or removal of surgical wound dressing: Secondary | ICD-10-CM | POA: Diagnosis not present

## 2021-02-08 DIAGNOSIS — M869 Osteomyelitis, unspecified: Secondary | ICD-10-CM | POA: Diagnosis not present

## 2021-02-08 DIAGNOSIS — Z4781 Encounter for orthopedic aftercare following surgical amputation: Secondary | ICD-10-CM | POA: Diagnosis not present

## 2021-02-08 DIAGNOSIS — I5030 Unspecified diastolic (congestive) heart failure: Secondary | ICD-10-CM | POA: Diagnosis not present

## 2021-02-08 DIAGNOSIS — I4891 Unspecified atrial fibrillation: Secondary | ICD-10-CM | POA: Diagnosis not present

## 2021-02-08 DIAGNOSIS — E1122 Type 2 diabetes mellitus with diabetic chronic kidney disease: Secondary | ICD-10-CM | POA: Diagnosis not present

## 2021-02-08 DIAGNOSIS — G4733 Obstructive sleep apnea (adult) (pediatric): Secondary | ICD-10-CM | POA: Diagnosis not present

## 2021-02-08 DIAGNOSIS — I051 Rheumatic mitral insufficiency: Secondary | ICD-10-CM | POA: Diagnosis not present

## 2021-02-08 DIAGNOSIS — E1169 Type 2 diabetes mellitus with other specified complication: Secondary | ICD-10-CM | POA: Diagnosis not present

## 2021-02-08 DIAGNOSIS — E1142 Type 2 diabetes mellitus with diabetic polyneuropathy: Secondary | ICD-10-CM | POA: Diagnosis not present

## 2021-02-08 LAB — AEROBIC/ANAEROBIC CULTURE W GRAM STAIN (SURGICAL/DEEP WOUND)

## 2021-02-08 NOTE — Telephone Encounter (Signed)
Mary, PT called saying they had their eval with pt and now are calling for orders. Pt doesn't want the home aid so they need a verbal order to d/c home aid. Also they need nursing orders for 2x a week for 3 weeks then 1 time a week for 1 week to help with wound care  [employer] [phone]

## 2021-02-09 NOTE — Telephone Encounter (Signed)
Please give the order.  Thanks.   

## 2021-02-09 NOTE — Telephone Encounter (Signed)
Called and gave Verbal orders to [place], [place].

## 2021-02-12 DIAGNOSIS — T8189XA Other complications of procedures, not elsewhere classified, initial encounter: Secondary | ICD-10-CM | POA: Diagnosis not present

## 2021-02-13 ENCOUNTER — Other Ambulatory Visit: Payer: Self-pay

## 2021-02-13 DIAGNOSIS — I739 Peripheral vascular disease, unspecified: Secondary | ICD-10-CM

## 2021-02-13 NOTE — Telephone Encounter (Signed)
Patient states she no longer sees Dr. Rockey Situ and is seeing Dr. Caryl Comes in June.

## 2021-02-13 NOTE — Addendum Note (Signed)
Addended byDoylene Bode on: 02/13/2021 01:03 PM   Modules accepted: Orders

## 2021-02-15 ENCOUNTER — Other Ambulatory Visit: Payer: Self-pay

## 2021-02-15 ENCOUNTER — Encounter: Payer: Self-pay | Admitting: Family Medicine

## 2021-02-15 ENCOUNTER — Ambulatory Visit (INDEPENDENT_AMBULATORY_CARE_PROVIDER_SITE_OTHER): Payer: HMO | Admitting: Family Medicine

## 2021-02-15 VITALS — BP 142/62 | HR 67 | Temp 96.6°F

## 2021-02-15 DIAGNOSIS — N184 Chronic kidney disease, stage 4 (severe): Secondary | ICD-10-CM

## 2021-02-15 DIAGNOSIS — M869 Osteomyelitis, unspecified: Secondary | ICD-10-CM | POA: Diagnosis not present

## 2021-02-15 DIAGNOSIS — E1142 Type 2 diabetes mellitus with diabetic polyneuropathy: Secondary | ICD-10-CM | POA: Diagnosis not present

## 2021-02-15 LAB — SURGICAL PATHOLOGY

## 2021-02-15 MED ORDER — GABAPENTIN 600 MG PO TABS: 600.0000 mg | ORAL_TABLET | Freq: Every day | ORAL | Status: DC

## 2021-02-15 MED ORDER — TRAMADOL HCL 50 MG PO TABS: 50.0000 mg | ORAL_TABLET | Freq: Every evening | ORAL | 1 refills | Status: DC | PRN

## 2021-02-15 MED ORDER — NOVOLOG FLEXPEN 100 UNIT/ML ~~LOC~~ SOPN: 5.0000 [IU] | PEN_INJECTOR | Freq: Three times a day (TID) | SUBCUTANEOUS | Status: DC

## 2021-02-15 NOTE — Progress Notes (Signed)
This visit occurred during the SARS-CoV-2 public health emergency.  Safety protocols were in place, including screening questions prior to the visit, additional usage of staff PPE, and extensive cleaning of exam room while observing appropriate contact time as indicated for disinfecting solutions.  Inpatient f/u.    Admit date: 01/28/2021  Discharge date: 02/06/2021  Time spent: 36 minutes  Recommendations for Outpatient Follow-up:  1. as outpatient get labs in 3 days and address medication changes on the [facility]--- fluid status-- and resume Demadex if needed 2. home health PT OT and RN in an outpatient setting 3. Final final cultures in the outpatient setting and adjust antibiotics Discharge Diagnoses:  MAIN problem for hospitalization  Diabetic foot wound status post amputation  Please see below for itemized issues addressed in [facility] [employer]   02/05/21 0611 02/06/21 0407 02/06/21 0610  Weight: 127.7 kg 130.6 kg 130.6 kg   Nonhealing left foot wound that started in January-previously followed by podiatry in [facility] and then [facility]-Rx about 1 month ago clindamycin for cellulitis ABI 01/30/2021 = left ABI 0.85 monophasic and TBI 0.14 Right lower extremity ABI is 1.23 with triphasic flow  MRI = osteomyelitis head fifth metatarsal base proximal phalanx--had initial surgery with podiatry and then underwent CO2 angiogram 4/29 under Dr. Scot Dock  revision of surgery 4/30 Dr. Sherwood Gambler Course:  Osteomyelitis left foot wound  Failed outpatient management on clindamycin  D/W ID--de-escalate from cefepime vancomycin to Augmentin growing Enterococcus/Staph ---  BC x 2 --5 d. from admit neg from podiatrist  S/p revision of surgery 4/30  Has stabilized  Foot Drop LLE  heel weightbearing  Secondary to diabetic polyneuropathy  PT recommends home health--add aide RN in addition  Peripheral vascular disease  CO2 angiogram performed 4/29---In-stent stenosis 3 places L SFA   NICM--removal of ICD's for bacteremia MSSA in the past  Blood culture negative X2 from 4/24--- no bacteremia-has negative cultures  DC monitors as has been stable  Imdur 30 Coreg 3.125  Hold demadex/losartan until seen in the OP  PAD status post lower extremity interventions in the past  ABIs as above  Atrial fibrillation CHADS2 score >4 on amiodarone  Continue amiodarone 200 at bedtime [OP LFT/TSH in 3 mo]  Continue Plavix 75, aspirin 81  OSA on CPAP  Resume cpap  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 Posey Pronto  1. Left foot incision and drainage washout debridement of the hallux and fifth metatarsal  2. Bone biopsy of the left foot (hallux and fifth metatarsal)  3. Packed primarily open  4. Sesamoidectomy  VVS PROCEDURE 4/29:   1. Conscious sedation  2. Ultrasound-guided access to the right common femoral artery  3. CO2 aortogram  4. Selective catheterization of the left external iliac artery with left lower extremity runoff using CO2 (second order catheterization)  5. Selective catheterization of the left superficial femoral artery (third order catheterization)  6. Angioplasty of 3 separate in-stent stenoses in the left superficial femoral artery.  surgery 4/30  Pre-operative diagnosis: abscess left foot and osteomyelitis  Post-operative diagnosis: same  Procedure:1. Bone resection of the base of the proximal phalanx  2. Delayed primary closure  3. Bone resection of the fifth metatarsal head and base of the  proximal phalanx of the fifth.  STOP taking these medications        clindamycin 300 MG capsule  Commonly known as: CLEOCIN   empagliflozin 10 MG Tabs tablet  Commonly known as: Jardiance   gentamicin cream 0.1 %  Commonly known as: GARAMYCIN   losartan 25  MG tablet  Commonly known as: COZAAR   OneTouch Ultra test strip  Generic drug: glucose blood   rosuvastatin 10 MG tablet  Commonly known as: CRESTOR   torsemide 20 MG tablet  Commonly known as: DEMADEX   ===================== Inpatient course discussed with patient.  Diabetic osteomyelitis status post resection then revision with multiple medication changes related to infection and chronic kidney disease.  She improved enough to where she can be discharged and is here for outpatient follow-up with labs pending.  Med changes reviewed with patient, see above.  She is doing daily dressing changes and has podiatry follow-up pending.  No fevers.  She still has some serosanguineous discharge on the bandage but no purulent discharge.  She has home health to help with dressing changes. We talked about her diabetes and sugar control. Sugar 88 this AM.  200s after meals.  We talked about arranging sliding scale insulin.  See orders. She has taken tramadol for pain at night and needed a refill on that.  Prescription sent. Chronic kidney disease and recent creatinine is discussed with patient.  Follow-up labs pending.  See notes on labs. She has had a bruise on L shin since the hospitalization.  Does not noted to be tender now.  Meds, vitals, and allergies reviewed.  ROS: Per HPI unless specifically indicated in ROS section  GEN: nad, alert and oriented HEENT: ncat NECK: supple w/o LA CV: rrr. PULM: ctab, no inc wob ABD: soft, +bs EXT: trace BLE edema SKIN: Bruise noted on left shin without spreading erythema. Left foot with sutures in place.  Dried serosanguineous discharge noted on bandage but no purulent discharge.  Skin appears well perfused.  Amputation site noted.  Suture sites appear to be clean dry and intact at time of exam.  rebandaged at office visit..  39 minutes were devoted to patient care in this encounter (this includes time spent reviewing the patient's file/history, interviewing and  examining the patient, counseling/reviewing plan with patient).

## 2021-02-15 NOTE — Patient Instructions (Signed)
Go to the lab on the way out.   If you have mychart we'll likely use that to update you.    Use sliding scale insulin and update me about your sugars in a few days.   Plan on recheck in 1 month.  Update me as needed in the meantime.  Take care.  Glad to see you.

## 2021-02-16 ENCOUNTER — Ambulatory Visit (INDEPENDENT_AMBULATORY_CARE_PROVIDER_SITE_OTHER): Payer: HMO | Admitting: Podiatry

## 2021-02-16 DIAGNOSIS — E1149 Type 2 diabetes mellitus with other diabetic neurological complication: Secondary | ICD-10-CM

## 2021-02-16 DIAGNOSIS — Z9889 Other specified postprocedural states: Secondary | ICD-10-CM

## 2021-02-16 DIAGNOSIS — S8012XA Contusion of left lower leg, initial encounter: Secondary | ICD-10-CM | POA: Diagnosis not present

## 2021-02-16 LAB — CBC WITH DIFFERENTIAL/PLATELET
Basophils Absolute: 0.1 10*3/uL (ref 0.0–0.1)
Basophils Relative: 1 % (ref 0.0–3.0)
Eosinophils Absolute: 0.1 10*3/uL (ref 0.0–0.7)
Eosinophils Relative: 1.3 % (ref 0.0–5.0)
HCT: 32.7 % — ABNORMAL LOW (ref 36.0–46.0)
Hemoglobin: 11 g/dL — ABNORMAL LOW (ref 12.0–15.0)
Lymphocytes Relative: 25.7 % (ref 12.0–46.0)
Lymphs Abs: 2.1 10*3/uL (ref 0.7–4.0)
MCHC: 33.7 g/dL (ref 30.0–36.0)
MCV: 92.5 fl (ref 78.0–100.0)
Monocytes Absolute: 0.6 10*3/uL (ref 0.1–1.0)
Monocytes Relative: 7 % (ref 3.0–12.0)
Neutro Abs: 5.3 10*3/uL (ref 1.4–7.7)
Neutrophils Relative %: 65 % (ref 43.0–77.0)
Platelets: 204 10*3/uL (ref 150.0–400.0)
RBC: 3.54 Mil/uL — ABNORMAL LOW (ref 3.87–5.11)
RDW: 14.8 % (ref 11.5–15.5)
WBC: 8.1 10*3/uL (ref 4.0–10.5)

## 2021-02-16 LAB — COMPREHENSIVE METABOLIC PANEL
ALT: 22 U/L (ref 0–35)
AST: 24 U/L (ref 0–37)
Albumin: 3.7 g/dL (ref 3.5–5.2)
Alkaline Phosphatase: 67 U/L (ref 39–117)
BUN: 48 mg/dL — ABNORMAL HIGH (ref 6–23)
CO2: 30 mEq/L (ref 19–32)
Calcium: 9.2 mg/dL (ref 8.4–10.5)
Chloride: 101 mEq/L (ref 96–112)
Creatinine, Ser: 1.94 mg/dL — ABNORMAL HIGH (ref 0.40–1.20)
GFR: 26.41 mL/min — ABNORMAL LOW (ref >60.00)
Glucose, Bld: 136 mg/dL — ABNORMAL HIGH (ref 70–99)
Potassium: 5.2 mEq/L — ABNORMAL HIGH (ref 3.5–5.1)
Sodium: 138 mEq/L (ref 135–145)
Total Bilirubin: 0.4 mg/dL (ref 0.2–1.2)
Total Protein: 7.2 g/dL (ref 6.0–8.3)

## 2021-02-16 MED ORDER — AMOXICILLIN-POT CLAVULANATE 875-125 MG PO TABS: 1.0000 | ORAL_TABLET | Freq: Two times a day (BID) | ORAL | 0 refills | Status: DC

## 2021-02-18 ENCOUNTER — Other Ambulatory Visit: Payer: Self-pay | Admitting: Family Medicine

## 2021-02-18 DIAGNOSIS — M869 Osteomyelitis, unspecified: Secondary | ICD-10-CM | POA: Insufficient documentation

## 2021-02-18 DIAGNOSIS — N184 Chronic kidney disease, stage 4 (severe): Secondary | ICD-10-CM

## 2021-02-18 MED ORDER — TORSEMIDE 20 MG PO TABS: 20.0000 mg | ORAL_TABLET | Freq: Every day | ORAL | 0 refills | Status: DC | PRN

## 2021-02-18 NOTE — Assessment & Plan Note (Signed)
With podiatry follow-up pending.  Continue dressing changes and offloading.  She has home health to help with dressing changes.  See notes on labs.  No change in medications at this point.  Discussed glycemic control.  See diabetes discussion.

## 2021-02-18 NOTE — Assessment & Plan Note (Signed)
Discussed using sliding scale insulin and I want her to update me about her sugars in a few days.   Plan on recheck in 1 month.  Update me as needed in the meantime.  She agrees with plan.

## 2021-02-18 NOTE — Assessment & Plan Note (Signed)
With recheck creatinine pending.  See notes on labs.  We will see about potentially restarting torsemide and her ARB after I see her labs.

## 2021-02-19 DIAGNOSIS — I5023 Acute on chronic systolic (congestive) heart failure: Secondary | ICD-10-CM | POA: Diagnosis not present

## 2021-02-19 DIAGNOSIS — I255 Ischemic cardiomyopathy: Secondary | ICD-10-CM | POA: Diagnosis not present

## 2021-02-19 DIAGNOSIS — I447 Left bundle-branch block, unspecified: Secondary | ICD-10-CM | POA: Diagnosis not present

## 2021-02-19 DIAGNOSIS — G4733 Obstructive sleep apnea (adult) (pediatric): Secondary | ICD-10-CM | POA: Diagnosis not present

## 2021-02-19 DIAGNOSIS — I4891 Unspecified atrial fibrillation: Secondary | ICD-10-CM | POA: Diagnosis not present

## 2021-02-19 DIAGNOSIS — D631 Anemia in chronic kidney disease: Secondary | ICD-10-CM | POA: Diagnosis not present

## 2021-02-19 DIAGNOSIS — N184 Chronic kidney disease, stage 4 (severe): Secondary | ICD-10-CM | POA: Diagnosis not present

## 2021-02-19 DIAGNOSIS — I051 Rheumatic mitral insufficiency: Secondary | ICD-10-CM | POA: Diagnosis not present

## 2021-02-19 DIAGNOSIS — F32A Depression, unspecified: Secondary | ICD-10-CM | POA: Diagnosis not present

## 2021-02-19 DIAGNOSIS — E1142 Type 2 diabetes mellitus with diabetic polyneuropathy: Secondary | ICD-10-CM | POA: Diagnosis not present

## 2021-02-19 DIAGNOSIS — E1169 Type 2 diabetes mellitus with other specified complication: Secondary | ICD-10-CM | POA: Diagnosis not present

## 2021-02-19 DIAGNOSIS — M869 Osteomyelitis, unspecified: Secondary | ICD-10-CM | POA: Diagnosis not present

## 2021-02-19 DIAGNOSIS — I70202 Unspecified atherosclerosis of native arteries of extremities, left leg: Secondary | ICD-10-CM | POA: Diagnosis not present

## 2021-02-19 DIAGNOSIS — I428 Other cardiomyopathies: Secondary | ICD-10-CM | POA: Diagnosis not present

## 2021-02-19 DIAGNOSIS — Z4801 Encounter for change or removal of surgical wound dressing: Secondary | ICD-10-CM | POA: Diagnosis not present

## 2021-02-19 DIAGNOSIS — I252 Old myocardial infarction: Secondary | ICD-10-CM | POA: Diagnosis not present

## 2021-02-19 DIAGNOSIS — Z89422 Acquired absence of other left toe(s): Secondary | ICD-10-CM | POA: Diagnosis not present

## 2021-02-19 DIAGNOSIS — I130 Hypertensive heart and chronic kidney disease with heart failure and stage 1 through stage 4 chronic kidney disease, or unspecified chronic kidney disease: Secondary | ICD-10-CM | POA: Diagnosis not present

## 2021-02-19 DIAGNOSIS — E785 Hyperlipidemia, unspecified: Secondary | ICD-10-CM | POA: Diagnosis not present

## 2021-02-19 DIAGNOSIS — E1122 Type 2 diabetes mellitus with diabetic chronic kidney disease: Secondary | ICD-10-CM | POA: Diagnosis not present

## 2021-02-19 DIAGNOSIS — I5030 Unspecified diastolic (congestive) heart failure: Secondary | ICD-10-CM | POA: Diagnosis not present

## 2021-02-19 DIAGNOSIS — Z4781 Encounter for orthopedic aftercare following surgical amputation: Secondary | ICD-10-CM | POA: Diagnosis not present

## 2021-02-19 DIAGNOSIS — E039 Hypothyroidism, unspecified: Secondary | ICD-10-CM | POA: Diagnosis not present

## 2021-02-19 DIAGNOSIS — E1151 Type 2 diabetes mellitus with diabetic peripheral angiopathy without gangrene: Secondary | ICD-10-CM | POA: Diagnosis not present

## 2021-02-19 DIAGNOSIS — I2510 Atherosclerotic heart disease of native coronary artery without angina pectoris: Secondary | ICD-10-CM | POA: Diagnosis not present

## 2021-02-20 ENCOUNTER — Encounter: Payer: Self-pay | Admitting: Podiatry

## 2021-02-20 NOTE — Progress Notes (Signed)
Subjective:  Patient ID: [name], female    DOB: [birth year],  MRN: [id number]  Chief Complaint  Patient presents with  . Routine Post Op      POV #1 DOS 02/03/2021 s/p I&D left foot       65 y.o. female returns for post-op check.  Patient presents with follow-up from listed above procedure.  Patient states that she is doing well.  She finished antibiotics until yesterday.  She may need a refill.  She is doing a little bit better.  Everything seems to be healing well.  She also has secondary complaint large hematoma on the left shin.  She hit it with a wheelchair she went to the hospital but for now we will continue monitoring.  She just wanted to make sure that I was doing okay as well.  She denies any other acute complaints.  Review of Systems: Negative except as noted in the HPI. Denies N/V/F/Ch.  Past Medical History:  Diagnosis Date  . Acute myocardial infarction, subendocardial infarction, initial episode of care ([place]) 07/21/2012  . Acute osteomyelitis involving ankle and foot ([place]) 02/06/2015  . Acute systolic heart failure ([place]) 07/21/2012   New onset 07/19/12; admission to [facility] ED. Elevated Troponins.  S/p 2D-echo with EF 20-25%.  S/p cardiac catheterization with stenting LAD.  Repeat 2D-echo 10/2011 with improved EF of 35%.   . Anemia   . Automatic implantable cardioverter-defibrillator in situ    a. MDT CRT-D 06/2014, SN: [id number]  .removed in feb 2020  . CAD (coronary artery disease)    a. cardiac cath 101/04/2012: PCI/DES to chronically occluded mLAD, consideration PCI to diag branch in 4 weeks.   . Cataract   . Chicken pox   . Chronic kidney disease   . Chronic systolic CHF (congestive heart failure) (HCC)    a. mixed ICM & NICM; b. EF 20-25% by echo 07/2012, mid-dist 2/3 of LV sev HK/AK, mild MR. echo 10/2012: EF 30-35%, sev HK ant-septal & inf walls, GR1DD, mild MR, PASP 33. c. echo 02/2013: EF 30%, GR1DD, mild MR. echo 04/2014: EF 30%, Septal-lat dyssynchrony,  global HK, inf AK, GR1DD, mild MR. d. echo 10/2014: EF50-55%, WM nl, GR1DD, septal mild paradox. e. echo 02/2015: EF 50-55%, wm   . Depression   . Heart attack ([place])   . Heel ulcer ([place]) 04/27/2015  . History of blood transfusion ~ 2011   "plasma; had neuropathy; couldn't walk"  . Hypertension   . LBBB (left bundle branch block)   . Neuromuscular disorder ([place])   . Neuropathy 2011  . Obesity, unspecified   . OSA on CPAP    Moderate with AHI 23/hr and now on CPAP at 16cm H2O.  Her DME is [facility]  . Pure hypercholesterolemia   . Sepsis ([place]) 09/2018  . Type II diabetes mellitus ([place])   . Unspecified vitamin D deficiency     Current Outpatient Medications:  .  amoxicillin-clavulanate (AUGMENTIN) 875-125 MG tablet, Take 1 tablet by mouth 2 (two) times daily., Disp: 28 tablet, Rfl: 0 .  amiodarone (PACERONE) 200 MG tablet, Take 1 tablet (200 mg total) by mouth daily., Disp: 90 tablet, Rfl: 1 .  amoxicillin-clavulanate (AUGMENTIN) 500-125 MG tablet, Take 1 tablet (500 mg total) by mouth every 12 (twelve) hours., Disp: 18 tablet, Rfl: 0 .  aspirin EC 81 MG EC tablet, Take 1 tablet (81 mg total) by mouth daily. Swallow whole., Disp: 30 tablet, Rfl: 11 .  atorvastatin (LIPITOR) 10 MG tablet, Take  1 tablet (10 mg total) by mouth daily., Disp: 30 tablet, Rfl: 11 .  calcitRIOL (ROCALTROL) 0.5 MCG capsule, Take 0.5 mcg by mouth every morning., Disp: , Rfl:  .  Calcium Carb-Cholecalciferol (CALCIUM CARBONATE-VITAMIN D3) 600-400 MG-UNIT TABS, Take 1 tablet by mouth 2 (two) times a day., Disp: , Rfl:  .  carvedilol (COREG) 3.125 MG tablet, Take 3.125 mg by mouth every morning., Disp: , Rfl:  .  clopidogrel (PLAVIX) 75 MG tablet, TAKE 1 TABLET BY MOUTH ONCE DAILY, Disp: 90 tablet, Rfl: 1 .  DULoxetine (CYMBALTA) 30 MG capsule, Take 1 capsule (30 mg total) by mouth at bedtime., Disp: 90 capsule, Rfl: 3 .  ferrous sulfate 325 (65 FE) MG tablet, Take 325 mg by mouth every morning., Disp: , Rfl:  .  gabapentin  (NEURONTIN) 600 MG tablet, Take 1 tablet (600 mg total) by mouth at bedtime., Disp: , Rfl:  .  insulin aspart (NOVOLOG FLEXPEN) 100 UNIT/ML FlexPen, Inject 5-10 Units into the skin 3 (three) times daily before meals. Sugar 100-149 - 5 units.  150-199- 6 units.  200-249- 7 units.  250-299- 8 units. 300-349- 9 units.  350+ 10 units., Disp: 15 mL, Rfl:  .  Insulin Pen Needle (PEN NEEDLES) 32G X 6 MM MISC, 1 each by Does not apply route 4 (four) times daily., Disp: 150 each, Rfl: 11 .  isosorbide mononitrate (IMDUR) 30 MG 24 hr tablet, Take 1 tablet (30 mg total) by mouth daily. Needs appt, Disp: 90 tablet, Rfl: 3 .  LANTUS SOLOSTAR 100 UNIT/ML Solostar Pen, INJECT 22 UNITS UNDER THE SKIN DAILY., Disp: 15 mL, Rfl: 5 .  levothyroxine (SYNTHROID) 50 MCG tablet, TAKE 1 TABLET BY MOUTH ONCE DAILY BEFOREBREAKFAST, Disp: 90 tablet, Rfl: 1 .  Multiple Vitamin (MULTIVITAMIN WITH MINERALS) TABS tablet, Take 1 tablet by mouth every morning., Disp: , Rfl:  .  nitroGLYCERIN (NITROSTAT) 0.4 MG SL tablet, Place 1 tablet (0.4 mg total) under the tongue every 5 (five) minutes as needed for chest pain., Disp: 25 tablet, Rfl: 3 .  Omega-3 Fatty Acids (FISH OIL) 1000 MG CAPS, Take 1,000 mg by mouth 2 (two) times a day., Disp: , Rfl:  .  torsemide (DEMADEX) 20 MG tablet, Take 1 tablet (20 mg total) by mouth daily as needed (For fluid)., Disp: 60 tablet, Rfl: 0 .  traMADol (ULTRAM) 50 MG tablet, Take 1-2 tablets (50-100 mg total) by mouth at bedtime as needed., Disp: 60 tablet, Rfl: 1  Social History   Tobacco Use  Smoking Status Never Smoker  Smokeless Tobacco Never Used    Allergies  Allergen Reactions  . Nsaids     Kidney disease   Objective:  There were no vitals filed for this visit. There is no height or weight on file to calculate BMI. Constitutional Well developed. Well nourished.  Vascular Foot warm and well perfused. Capillary refill normal to all digits.   Neurologic Normal speech. Oriented to  person, place, and time. Epicritic sensation to light touch grossly present bilaterally.  Dermatologic Skin healing well without signs of infection. Skin edges well coapted without signs of infection.  Orthopedic: Tenderness to palpation noted about the surgical site.   Radiographs: None Assessment:   1. Hematoma of left lower leg   2. Type II diabetes mellitus with neurological manifestations ([place])   3. Status post foot surgery    Plan:  Patient was evaluated and treated and all questions answered.  S/p foot surgery left -Progressing as expected post-operatively. -XR:  None -WB Status: Partial weightbearing to the heel in [place] wedge shoe -Sutures: Intact.  No clinical signs of dehiscence noted.  No complication noted. -Medications: Augmentin will continue until skin has completely epithelialized -Foot redressed.  Left leg superficial hematoma -I explained the patient the etiology of hematoma and various treatment options were extensively discussed.  For now we will continue to clinically monitor the hematoma.  If it continues to cause a lot of pain we will plan on draining the hematoma.  She is currently on Plavix which could likely be the etiology of the hematoma.   No follow-ups on file.

## 2021-02-21 ENCOUNTER — Encounter (HOSPITAL_COMMUNITY): Payer: Self-pay | Admitting: Podiatry

## 2021-02-26 ENCOUNTER — Emergency Department (HOSPITAL_COMMUNITY): Payer: HMO

## 2021-02-26 ENCOUNTER — Inpatient Hospital Stay (HOSPITAL_COMMUNITY)
Admission: EM | Admit: 2021-02-26 | Discharge: 2021-03-07 | DRG: 617 | Disposition: A | Discharge status: Home Health | Payer: HMO | Attending: Internal Medicine | Admitting: Internal Medicine

## 2021-02-26 ENCOUNTER — Ambulatory Visit (INDEPENDENT_AMBULATORY_CARE_PROVIDER_SITE_OTHER): Payer: HMO | Admitting: Podiatry

## 2021-02-26 ENCOUNTER — Other Ambulatory Visit: Payer: Self-pay

## 2021-02-26 ENCOUNTER — Encounter (HOSPITAL_COMMUNITY): Payer: Self-pay

## 2021-02-26 DIAGNOSIS — T8131XA Disruption of external operation (surgical) wound, not elsewhere classified, initial encounter: Secondary | ICD-10-CM | POA: Diagnosis present

## 2021-02-26 DIAGNOSIS — I428 Other cardiomyopathies: Secondary | ICD-10-CM | POA: Diagnosis present

## 2021-02-26 DIAGNOSIS — M869 Osteomyelitis, unspecified: Secondary | ICD-10-CM | POA: Diagnosis present

## 2021-02-26 DIAGNOSIS — Z8052 Family history of malignant neoplasm of bladder: Secondary | ICD-10-CM

## 2021-02-26 DIAGNOSIS — Z794 Long term (current) use of insulin: Secondary | ICD-10-CM

## 2021-02-26 DIAGNOSIS — Z9889 Other specified postprocedural states: Secondary | ICD-10-CM | POA: Diagnosis not present

## 2021-02-26 DIAGNOSIS — I10 Essential (primary) hypertension: Secondary | ICD-10-CM | POA: Diagnosis not present

## 2021-02-26 DIAGNOSIS — E7800 Pure hypercholesterolemia, unspecified: Secondary | ICD-10-CM | POA: Diagnosis present

## 2021-02-26 DIAGNOSIS — I447 Left bundle-branch block, unspecified: Secondary | ICD-10-CM | POA: Diagnosis not present

## 2021-02-26 DIAGNOSIS — I5022 Chronic systolic (congestive) heart failure: Secondary | ICD-10-CM | POA: Diagnosis present

## 2021-02-26 DIAGNOSIS — L0390 Cellulitis, unspecified: Secondary | ICD-10-CM | POA: Diagnosis present

## 2021-02-26 DIAGNOSIS — Z6841 Body Mass Index (BMI) 40.0 and over, adult: Secondary | ICD-10-CM

## 2021-02-26 DIAGNOSIS — L97529 Non-pressure chronic ulcer of other part of left foot with unspecified severity: Secondary | ICD-10-CM | POA: Diagnosis present

## 2021-02-26 DIAGNOSIS — I2510 Atherosclerotic heart disease of native coronary artery without angina pectoris: Secondary | ICD-10-CM | POA: Diagnosis present

## 2021-02-26 DIAGNOSIS — E039 Hypothyroidism, unspecified: Secondary | ICD-10-CM | POA: Diagnosis present

## 2021-02-26 DIAGNOSIS — Z95810 Presence of automatic (implantable) cardiac defibrillator: Secondary | ICD-10-CM

## 2021-02-26 DIAGNOSIS — I252 Old myocardial infarction: Secondary | ICD-10-CM

## 2021-02-26 DIAGNOSIS — E1165 Type 2 diabetes mellitus with hyperglycemia: Secondary | ICD-10-CM | POA: Diagnosis present

## 2021-02-26 DIAGNOSIS — E11628 Type 2 diabetes mellitus with other skin complications: Principal | ICD-10-CM | POA: Diagnosis present

## 2021-02-26 DIAGNOSIS — B957 Other staphylococcus as the cause of diseases classified elsewhere: Secondary | ICD-10-CM | POA: Diagnosis present

## 2021-02-26 DIAGNOSIS — L03116 Cellulitis of left lower limb: Secondary | ICD-10-CM | POA: Diagnosis present

## 2021-02-26 DIAGNOSIS — M6258 Muscle wasting and atrophy, not elsewhere classified, other site: Secondary | ICD-10-CM | POA: Diagnosis not present

## 2021-02-26 DIAGNOSIS — Z7902 Long term (current) use of antithrombotics/antiplatelets: Secondary | ICD-10-CM

## 2021-02-26 DIAGNOSIS — I480 Paroxysmal atrial fibrillation: Secondary | ICD-10-CM | POA: Diagnosis present

## 2021-02-26 DIAGNOSIS — Z8249 Family history of ischemic heart disease and other diseases of the circulatory system: Secondary | ICD-10-CM

## 2021-02-26 DIAGNOSIS — E11621 Type 2 diabetes mellitus with foot ulcer: Secondary | ICD-10-CM | POA: Diagnosis present

## 2021-02-26 DIAGNOSIS — Z955 Presence of coronary angioplasty implant and graft: Secondary | ICD-10-CM

## 2021-02-26 DIAGNOSIS — E785 Hyperlipidemia, unspecified: Secondary | ICD-10-CM | POA: Diagnosis not present

## 2021-02-26 DIAGNOSIS — I130 Hypertensive heart and chronic kidney disease with heart failure and stage 1 through stage 4 chronic kidney disease, or unspecified chronic kidney disease: Secondary | ICD-10-CM | POA: Diagnosis present

## 2021-02-26 DIAGNOSIS — M7732 Calcaneal spur, left foot: Secondary | ICD-10-CM | POA: Diagnosis not present

## 2021-02-26 DIAGNOSIS — F32A Depression, unspecified: Secondary | ICD-10-CM | POA: Diagnosis present

## 2021-02-26 DIAGNOSIS — Z89422 Acquired absence of other left toe(s): Secondary | ICD-10-CM | POA: Diagnosis not present

## 2021-02-26 DIAGNOSIS — I160 Hypertensive urgency: Secondary | ICD-10-CM | POA: Diagnosis not present

## 2021-02-26 DIAGNOSIS — L02612 Cutaneous abscess of left foot: Secondary | ICD-10-CM

## 2021-02-26 DIAGNOSIS — I70245 Atherosclerosis of native arteries of left leg with ulceration of other part of foot: Secondary | ICD-10-CM | POA: Diagnosis not present

## 2021-02-26 DIAGNOSIS — L089 Local infection of the skin and subcutaneous tissue, unspecified: Secondary | ICD-10-CM

## 2021-02-26 DIAGNOSIS — G4733 Obstructive sleep apnea (adult) (pediatric): Secondary | ICD-10-CM | POA: Diagnosis present

## 2021-02-26 DIAGNOSIS — Z9049 Acquired absence of other specified parts of digestive tract: Secondary | ICD-10-CM

## 2021-02-26 DIAGNOSIS — Z833 Family history of diabetes mellitus: Secondary | ICD-10-CM

## 2021-02-26 DIAGNOSIS — E1122 Type 2 diabetes mellitus with diabetic chronic kidney disease: Secondary | ICD-10-CM | POA: Diagnosis present

## 2021-02-26 DIAGNOSIS — Z20822 Contact with and (suspected) exposure to covid-19: Secondary | ICD-10-CM | POA: Diagnosis present

## 2021-02-26 DIAGNOSIS — Z89421 Acquired absence of other right toe(s): Secondary | ICD-10-CM | POA: Diagnosis not present

## 2021-02-26 DIAGNOSIS — M86172 Other acute osteomyelitis, left ankle and foot: Secondary | ICD-10-CM | POA: Diagnosis not present

## 2021-02-26 DIAGNOSIS — L03032 Cellulitis of left toe: Secondary | ICD-10-CM

## 2021-02-26 DIAGNOSIS — E1169 Type 2 diabetes mellitus with other specified complication: Secondary | ICD-10-CM | POA: Diagnosis present

## 2021-02-26 DIAGNOSIS — Z79899 Other long term (current) drug therapy: Secondary | ICD-10-CM

## 2021-02-26 DIAGNOSIS — Z7982 Long term (current) use of aspirin: Secondary | ICD-10-CM

## 2021-02-26 DIAGNOSIS — Z807 Family history of other malignant neoplasms of lymphoid, hematopoietic and related tissues: Secondary | ICD-10-CM

## 2021-02-26 DIAGNOSIS — Z89412 Acquired absence of left great toe: Secondary | ICD-10-CM | POA: Diagnosis not present

## 2021-02-26 DIAGNOSIS — D638 Anemia in other chronic diseases classified elsewhere: Secondary | ICD-10-CM | POA: Diagnosis present

## 2021-02-26 DIAGNOSIS — M7989 Other specified soft tissue disorders: Secondary | ICD-10-CM | POA: Diagnosis not present

## 2021-02-26 DIAGNOSIS — Z8673 Personal history of transient ischemic attack (TIA), and cerebral infarction without residual deficits: Secondary | ICD-10-CM

## 2021-02-26 DIAGNOSIS — Z886 Allergy status to analgesic agent status: Secondary | ICD-10-CM

## 2021-02-26 DIAGNOSIS — Z8261 Family history of arthritis: Secondary | ICD-10-CM

## 2021-02-26 DIAGNOSIS — Z90710 Acquired absence of both cervix and uterus: Secondary | ICD-10-CM

## 2021-02-26 DIAGNOSIS — IMO0002 Reserved for concepts with insufficient information to code with codable children: Secondary | ICD-10-CM | POA: Diagnosis present

## 2021-02-26 DIAGNOSIS — N184 Chronic kidney disease, stage 4 (severe): Secondary | ICD-10-CM | POA: Diagnosis present

## 2021-02-26 DIAGNOSIS — R600 Localized edema: Secondary | ICD-10-CM | POA: Diagnosis not present

## 2021-02-26 DIAGNOSIS — Z7901 Long term (current) use of anticoagulants: Secondary | ICD-10-CM

## 2021-02-26 DIAGNOSIS — I739 Peripheral vascular disease, unspecified: Secondary | ICD-10-CM | POA: Diagnosis not present

## 2021-02-26 DIAGNOSIS — E1129 Type 2 diabetes mellitus with other diabetic kidney complication: Secondary | ICD-10-CM | POA: Diagnosis present

## 2021-02-26 DIAGNOSIS — M19072 Primary osteoarthritis, left ankle and foot: Secondary | ICD-10-CM | POA: Diagnosis not present

## 2021-02-26 DIAGNOSIS — Y838 Other surgical procedures as the cause of abnormal reaction of the patient, or of later complication, without mention of misadventure at the time of the procedure: Secondary | ICD-10-CM | POA: Diagnosis present

## 2021-02-26 DIAGNOSIS — E1152 Type 2 diabetes mellitus with diabetic peripheral angiopathy with gangrene: Secondary | ICD-10-CM | POA: Diagnosis present

## 2021-02-26 DIAGNOSIS — I472 Ventricular tachycardia: Secondary | ICD-10-CM | POA: Diagnosis present

## 2021-02-26 DIAGNOSIS — M25572 Pain in left ankle and joints of left foot: Secondary | ICD-10-CM | POA: Diagnosis not present

## 2021-02-26 DIAGNOSIS — Z825 Family history of asthma and other chronic lower respiratory diseases: Secondary | ICD-10-CM

## 2021-02-26 DIAGNOSIS — Z452 Encounter for adjustment and management of vascular access device: Secondary | ICD-10-CM | POA: Diagnosis not present

## 2021-02-26 DIAGNOSIS — Z79890 Hormone replacement therapy (postmenopausal): Secondary | ICD-10-CM

## 2021-02-26 DIAGNOSIS — Z8042 Family history of malignant neoplasm of prostate: Secondary | ICD-10-CM

## 2021-02-26 DIAGNOSIS — S8012XA Contusion of left lower leg, initial encounter: Secondary | ICD-10-CM

## 2021-02-26 LAB — CBC WITH DIFFERENTIAL/PLATELET
Abs Immature Granulocytes: 0.01 10*3/uL (ref 0.00–0.07)
Basophils Absolute: 0 10*3/uL (ref 0.0–0.1)
Basophils Relative: 0 %
Eosinophils Absolute: 0.1 10*3/uL (ref 0.0–0.5)
Eosinophils Relative: 1 %
HCT: 36.3 % (ref 36.0–46.0)
Hemoglobin: 11.5 g/dL — ABNORMAL LOW (ref 12.0–15.0)
Immature Granulocytes: 0 %
Lymphocytes Relative: 29 %
Lymphs Abs: 2.1 10*3/uL (ref 0.7–4.0)
MCH: 30.5 pg (ref 26.0–34.0)
MCHC: 31.7 g/dL (ref 30.0–36.0)
MCV: 96.3 fL (ref 80.0–100.0)
Monocytes Absolute: 0.5 10*3/uL (ref 0.1–1.0)
Monocytes Relative: 7 %
Neutro Abs: 4.5 10*3/uL (ref 1.7–7.7)
Neutrophils Relative %: 63 %
Platelets: 184 10*3/uL (ref 150–400)
RBC: 3.77 MIL/uL — ABNORMAL LOW (ref 3.87–5.11)
RDW: 13.5 % (ref 11.5–15.5)
WBC: 7.3 10*3/uL (ref 4.0–10.5)
nRBC: 0 % (ref 0.0–0.2)

## 2021-02-26 LAB — COMPREHENSIVE METABOLIC PANEL
ALT: 15 U/L (ref 0–44)
AST: 17 U/L (ref 15–41)
Albumin: 3.5 g/dL (ref 3.5–5.0)
Alkaline Phosphatase: 64 U/L (ref 38–126)
Anion gap: 10 (ref 5–15)
BUN: 31 mg/dL — ABNORMAL HIGH (ref 8–23)
CO2: 25 mmol/L (ref 22–32)
Calcium: 9.6 mg/dL (ref 8.9–10.3)
Chloride: 103 mmol/L (ref 98–111)
Creatinine, Ser: 1.8 mg/dL — ABNORMAL HIGH (ref 0.44–1.00)
GFR, Estimated: 30 mL/min — ABNORMAL LOW (ref >60)
Glucose, Bld: 154 mg/dL — ABNORMAL HIGH (ref 70–99)
Potassium: 4.8 mmol/L (ref 3.5–5.1)
Sodium: 138 mmol/L (ref 135–145)
Total Bilirubin: 0.7 mg/dL (ref 0.3–1.2)
Total Protein: 7.2 g/dL (ref 6.5–8.1)

## 2021-02-26 NOTE — ED Provider Notes (Signed)
Emergency Medicine Provider Triage Evaluation Note  ANVITHA HUTMACHER , a 65 y.o. female  was evaluated in triage.  Pt complains of L foot infection.  Had surgery to remove bone from foot 4 weeks ago. Noticed drainage and redness today when dressing was being changed. No pain as patient has neuropathy. No fever or injuries.  Review of Systems  Positive: wound Negative: fever  Physical Exam  BP 129/65 (BP Location: Left Arm)   Pulse 69   Temp 98.3 F (36.8 C) (Oral)   Resp 16   SpO2 98%  Gen:   Awake, no distress   Resp:  Normal effort  MSK:   Moves extremities without difficulty  Other:  Dressing on LLE  Medical Decision Making  Medically screening exam initiated at 6:57 PM.  Appropriate orders placed.  Etosha Wetherell [place] was informed that the remainder of the evaluation will be completed by another provider, this initial triage assessment does not replace that evaluation, and the importance of remaining in the ED until their evaluation is complete.  Labs and blood cultures ordered, also need imaging.   Delia Heady, PA-C 02/26/21 1859    Valarie Merino, MD 02/26/21 [phone]

## 2021-02-26 NOTE — ED Triage Notes (Signed)
Pt recent Left foot surgery 4 weeks ago. HH RN came to see pt today, states her foot "busted open" beside the stitches. States surgeon sent her here for IV ABX, MRI and possible partial amputation.

## 2021-02-27 ENCOUNTER — Inpatient Hospital Stay (HOSPITAL_COMMUNITY): Payer: HMO

## 2021-02-27 ENCOUNTER — Encounter (HOSPITAL_COMMUNITY): Payer: Self-pay | Admitting: Internal Medicine

## 2021-02-27 ENCOUNTER — Encounter: Payer: Self-pay | Admitting: Podiatry

## 2021-02-27 DIAGNOSIS — F32A Depression, unspecified: Secondary | ICD-10-CM | POA: Diagnosis present

## 2021-02-27 DIAGNOSIS — E785 Hyperlipidemia, unspecified: Secondary | ICD-10-CM | POA: Diagnosis not present

## 2021-02-27 DIAGNOSIS — I2510 Atherosclerotic heart disease of native coronary artery without angina pectoris: Secondary | ICD-10-CM | POA: Diagnosis present

## 2021-02-27 DIAGNOSIS — L03116 Cellulitis of left lower limb: Secondary | ICD-10-CM | POA: Diagnosis present

## 2021-02-27 DIAGNOSIS — I10 Essential (primary) hypertension: Secondary | ICD-10-CM

## 2021-02-27 DIAGNOSIS — E039 Hypothyroidism, unspecified: Secondary | ICD-10-CM | POA: Diagnosis present

## 2021-02-27 DIAGNOSIS — L97529 Non-pressure chronic ulcer of other part of left foot with unspecified severity: Secondary | ICD-10-CM | POA: Diagnosis not present

## 2021-02-27 DIAGNOSIS — Z89421 Acquired absence of other right toe(s): Secondary | ICD-10-CM | POA: Diagnosis not present

## 2021-02-27 DIAGNOSIS — N184 Chronic kidney disease, stage 4 (severe): Secondary | ICD-10-CM | POA: Diagnosis present

## 2021-02-27 DIAGNOSIS — I160 Hypertensive urgency: Secondary | ICD-10-CM | POA: Diagnosis not present

## 2021-02-27 DIAGNOSIS — E1122 Type 2 diabetes mellitus with diabetic chronic kidney disease: Secondary | ICD-10-CM | POA: Diagnosis present

## 2021-02-27 DIAGNOSIS — E1169 Type 2 diabetes mellitus with other specified complication: Secondary | ICD-10-CM | POA: Diagnosis present

## 2021-02-27 DIAGNOSIS — I70245 Atherosclerosis of native arteries of left leg with ulceration of other part of foot: Secondary | ICD-10-CM | POA: Diagnosis not present

## 2021-02-27 DIAGNOSIS — E1165 Type 2 diabetes mellitus with hyperglycemia: Secondary | ICD-10-CM | POA: Diagnosis present

## 2021-02-27 DIAGNOSIS — M869 Osteomyelitis, unspecified: Secondary | ICD-10-CM | POA: Diagnosis present

## 2021-02-27 DIAGNOSIS — I472 Ventricular tachycardia: Secondary | ICD-10-CM | POA: Diagnosis present

## 2021-02-27 DIAGNOSIS — Z20822 Contact with and (suspected) exposure to covid-19: Secondary | ICD-10-CM | POA: Diagnosis present

## 2021-02-27 DIAGNOSIS — Y838 Other surgical procedures as the cause of abnormal reaction of the patient, or of later complication, without mention of misadventure at the time of the procedure: Secondary | ICD-10-CM | POA: Diagnosis present

## 2021-02-27 DIAGNOSIS — I252 Old myocardial infarction: Secondary | ICD-10-CM | POA: Diagnosis not present

## 2021-02-27 DIAGNOSIS — E1152 Type 2 diabetes mellitus with diabetic peripheral angiopathy with gangrene: Secondary | ICD-10-CM | POA: Diagnosis present

## 2021-02-27 DIAGNOSIS — I130 Hypertensive heart and chronic kidney disease with heart failure and stage 1 through stage 4 chronic kidney disease, or unspecified chronic kidney disease: Secondary | ICD-10-CM | POA: Diagnosis present

## 2021-02-27 DIAGNOSIS — T8131XA Disruption of external operation (surgical) wound, not elsewhere classified, initial encounter: Secondary | ICD-10-CM | POA: Diagnosis present

## 2021-02-27 DIAGNOSIS — Z89422 Acquired absence of other left toe(s): Secondary | ICD-10-CM | POA: Diagnosis not present

## 2021-02-27 DIAGNOSIS — I428 Other cardiomyopathies: Secondary | ICD-10-CM | POA: Diagnosis present

## 2021-02-27 DIAGNOSIS — E11628 Type 2 diabetes mellitus with other skin complications: Secondary | ICD-10-CM | POA: Diagnosis present

## 2021-02-27 DIAGNOSIS — L0390 Cellulitis, unspecified: Secondary | ICD-10-CM | POA: Insufficient documentation

## 2021-02-27 DIAGNOSIS — D638 Anemia in other chronic diseases classified elsewhere: Secondary | ICD-10-CM | POA: Diagnosis present

## 2021-02-27 DIAGNOSIS — E11621 Type 2 diabetes mellitus with foot ulcer: Secondary | ICD-10-CM | POA: Diagnosis present

## 2021-02-27 DIAGNOSIS — M86172 Other acute osteomyelitis, left ankle and foot: Secondary | ICD-10-CM | POA: Diagnosis not present

## 2021-02-27 DIAGNOSIS — E1129 Type 2 diabetes mellitus with other diabetic kidney complication: Secondary | ICD-10-CM | POA: Diagnosis not present

## 2021-02-27 DIAGNOSIS — I5022 Chronic systolic (congestive) heart failure: Secondary | ICD-10-CM | POA: Diagnosis present

## 2021-02-27 DIAGNOSIS — I739 Peripheral vascular disease, unspecified: Secondary | ICD-10-CM | POA: Diagnosis not present

## 2021-02-27 DIAGNOSIS — Z6841 Body Mass Index (BMI) 40.0 and over, adult: Secondary | ICD-10-CM | POA: Diagnosis not present

## 2021-02-27 LAB — CBG MONITORING, ED
Glucose-Capillary: 131 mg/dL — ABNORMAL HIGH (ref 70–99)
Glucose-Capillary: 120 mg/dL — ABNORMAL HIGH (ref 70–99)
Glucose-Capillary: 130 mg/dL — ABNORMAL HIGH (ref 70–99)
Glucose-Capillary: 137 mg/dL — ABNORMAL HIGH (ref 70–99)
Glucose-Capillary: 149 mg/dL — ABNORMAL HIGH (ref 70–99)

## 2021-02-27 LAB — CBC
HCT: 33.7 % — ABNORMAL LOW (ref 36.0–46.0)
Hemoglobin: 10.7 g/dL — ABNORMAL LOW (ref 12.0–15.0)
MCH: 30.2 pg (ref 26.0–34.0)
MCHC: 31.8 g/dL (ref 30.0–36.0)
MCV: 95.2 fL (ref 80.0–100.0)
Platelets: 149 10*3/uL — ABNORMAL LOW (ref 150–400)
RBC: 3.54 MIL/uL — ABNORMAL LOW (ref 3.87–5.11)
RDW: 13.6 % (ref 11.5–15.5)
WBC: 5.5 10*3/uL (ref 4.0–10.5)
nRBC: 0 % (ref 0.0–0.2)

## 2021-02-27 LAB — BASIC METABOLIC PANEL
Anion gap: 8 (ref 5–15)
BUN: 29 mg/dL — ABNORMAL HIGH (ref 8–23)
CO2: 25 mmol/L (ref 22–32)
Calcium: 8.8 mg/dL — ABNORMAL LOW (ref 8.9–10.3)
Chloride: 106 mmol/L (ref 98–111)
Creatinine, Ser: 1.82 mg/dL — ABNORMAL HIGH (ref 0.44–1.00)
GFR, Estimated: 30 mL/min — ABNORMAL LOW (ref >60)
Glucose, Bld: 190 mg/dL — ABNORMAL HIGH (ref 70–99)
Potassium: 4.9 mmol/L (ref 3.5–5.1)
Sodium: 139 mmol/L (ref 135–145)

## 2021-02-27 LAB — RESP PANEL BY RT-PCR (FLU A&B, COVID) ARPGX2
Influenza A by PCR: NEGATIVE
Influenza B by PCR: NEGATIVE
SARS Coronavirus 2 by RT PCR: NEGATIVE

## 2021-02-27 LAB — GLUCOSE, CAPILLARY: Glucose-Capillary: 236 mg/dL — ABNORMAL HIGH (ref 70–99)

## 2021-02-27 MED ORDER — FERROUS SULFATE 325 (65 FE) MG PO TABS
325.0000 mg | ORAL_TABLET | Freq: Every morning | ORAL | Status: DC
  Administered 2021-02-27 – 2021-03-07 (×9): 325 mg via ORAL
  Filled 2021-02-27 (×9): qty 1

## 2021-02-27 MED ORDER — LEVOTHYROXINE SODIUM 50 MCG PO TABS
50.0000 ug | ORAL_TABLET | Freq: Every day | ORAL | Status: DC
  Administered 2021-02-27 – 2021-03-07 (×8): 50 ug via ORAL
  Filled 2021-02-27 (×5): qty 1
  Filled 2021-02-27: qty 2
  Filled 2021-02-27 (×2): qty 1

## 2021-02-27 MED ORDER — VANCOMYCIN HCL 1000 MG/200ML IV SOLN: 1000.0000 mg | Freq: Once | INTRAVENOUS | Status: DC

## 2021-02-27 MED ORDER — VANCOMYCIN HCL 2000 MG/400ML IV SOLN
2000.0000 mg | INTRAVENOUS | Status: DC
  Administered 2021-03-01 – 2021-03-05 (×3): 2000 mg via INTRAVENOUS
  Filled 2021-02-27 (×4): qty 400

## 2021-02-27 MED ORDER — SODIUM CHLORIDE 0.9 % IV SOLN: 2.0000 g | Freq: Once | INTRAVENOUS | Status: DC

## 2021-02-27 MED ORDER — INSULIN GLARGINE 100 UNIT/ML ~~LOC~~ SOLN
22.0000 [IU] | Freq: Every day | SUBCUTANEOUS | Status: DC
  Administered 2021-02-27 – 2021-03-06 (×8): 22 [IU] via SUBCUTANEOUS
  Filled 2021-02-27 (×10): qty 0.22

## 2021-02-27 MED ORDER — SODIUM CHLORIDE 0.9 % IV SOLN
2.0000 g | Freq: Two times a day (BID) | INTRAVENOUS | Status: DC
  Administered 2021-02-27 – 2021-03-06 (×15): 2 g via INTRAVENOUS
  Filled 2021-02-27 (×18): qty 2

## 2021-02-27 MED ORDER — MORPHINE SULFATE (PF) 2 MG/ML IV SOLN
1.0000 mg | INTRAVENOUS | Status: DC | PRN
  Administered 2021-03-02: 1 mg via INTRAVENOUS
  Filled 2021-02-27: qty 1

## 2021-02-27 MED ORDER — CARVEDILOL 3.125 MG PO TABS
3.1250 mg | ORAL_TABLET | Freq: Every morning | ORAL | Status: DC
  Administered 2021-02-28 – 2021-03-07 (×8): 3.125 mg via ORAL
  Filled 2021-02-27 (×8): qty 1

## 2021-02-27 MED ORDER — NITROGLYCERIN 0.4 MG SL SUBL: 0.4000 mg | SUBLINGUAL_TABLET | SUBLINGUAL | Status: DC | PRN

## 2021-02-27 MED ORDER — DULOXETINE HCL 30 MG PO CPEP
30.0000 mg | ORAL_CAPSULE | Freq: Every day | ORAL | Status: DC
  Administered 2021-02-27 – 2021-03-06 (×8): 30 mg via ORAL
  Filled 2021-02-27 (×9): qty 1

## 2021-02-27 MED ORDER — ATORVASTATIN CALCIUM 10 MG PO TABS
10.0000 mg | ORAL_TABLET | Freq: Every day | ORAL | Status: DC
  Administered 2021-02-27 – 2021-03-07 (×9): 10 mg via ORAL
  Filled 2021-02-27 (×9): qty 1

## 2021-02-27 MED ORDER — GABAPENTIN 300 MG PO CAPS
600.0000 mg | ORAL_CAPSULE | Freq: Every day | ORAL | Status: DC
  Administered 2021-02-27 – 2021-03-06 (×8): 600 mg via ORAL
  Filled 2021-02-27 (×8): qty 2

## 2021-02-27 MED ORDER — CALCITRIOL 0.5 MCG PO CAPS
0.5000 ug | ORAL_CAPSULE | Freq: Every morning | ORAL | Status: DC
  Administered 2021-02-27 – 2021-03-07 (×9): 0.5 ug via ORAL
  Filled 2021-02-27 (×9): qty 1

## 2021-02-27 MED ORDER — PIPERACILLIN-TAZOBACTAM 3.375 G IVPB 30 MIN
3.3750 g | Freq: Once | INTRAVENOUS | Status: AC
  Administered 2021-02-27: 3.375 g via INTRAVENOUS
  Filled 2021-02-27: qty 50

## 2021-02-27 MED ORDER — AMIODARONE HCL 200 MG PO TABS
200.0000 mg | ORAL_TABLET | Freq: Every day | ORAL | Status: DC
  Administered 2021-02-27 – 2021-03-07 (×9): 200 mg via ORAL
  Filled 2021-02-27 (×9): qty 1

## 2021-02-27 MED ORDER — INSULIN ASPART 100 UNIT/ML IJ SOLN
0.0000 [IU] | INTRAMUSCULAR | Status: DC
  Administered 2021-02-27: 1 [IU] via SUBCUTANEOUS
  Administered 2021-02-27: 3 [IU] via SUBCUTANEOUS
  Administered 2021-02-28: 2 [IU] via SUBCUTANEOUS
  Administered 2021-02-28: 3 [IU] via SUBCUTANEOUS
  Administered 2021-02-28 (×2): 2 [IU] via SUBCUTANEOUS
  Administered 2021-02-28: 1 [IU] via SUBCUTANEOUS
  Administered 2021-03-01 (×2): 2 [IU] via SUBCUTANEOUS
  Administered 2021-03-01 (×2): 1 [IU] via SUBCUTANEOUS
  Administered 2021-03-01 (×2): 2 [IU] via SUBCUTANEOUS
  Administered 2021-03-02: 1 [IU] via SUBCUTANEOUS
  Administered 2021-03-02: 5 [IU] via SUBCUTANEOUS
  Administered 2021-03-02: 3 [IU] via SUBCUTANEOUS
  Administered 2021-03-02: 1 [IU] via SUBCUTANEOUS
  Administered 2021-03-03 (×3): 5 [IU] via SUBCUTANEOUS
  Administered 2021-03-03: 3 [IU] via SUBCUTANEOUS
  Administered 2021-03-03: 5 [IU] via SUBCUTANEOUS
  Administered 2021-03-04 (×3): 2 [IU] via SUBCUTANEOUS
  Administered 2021-03-04: 7 [IU] via SUBCUTANEOUS
  Administered 2021-03-05: 3 [IU] via SUBCUTANEOUS
  Administered 2021-03-05 (×4): 2 [IU] via SUBCUTANEOUS
  Administered 2021-03-06 (×3): 1 [IU] via SUBCUTANEOUS

## 2021-02-27 MED ORDER — VANCOMYCIN HCL 2000 MG/400ML IV SOLN
2000.0000 mg | Freq: Once | INTRAVENOUS | Status: AC
  Administered 2021-02-27: 2000 mg via INTRAVENOUS
  Filled 2021-02-27: qty 400

## 2021-02-27 MED ORDER — ISOSORBIDE MONONITRATE ER 30 MG PO TB24
30.0000 mg | ORAL_TABLET | Freq: Every day | ORAL | Status: DC
  Administered 2021-02-27 – 2021-03-07 (×9): 30 mg via ORAL
  Filled 2021-02-27 (×9): qty 1

## 2021-02-27 NOTE — ED Notes (Signed)
Patient transported to CT 

## 2021-02-27 NOTE — Progress Notes (Addendum)
Pharmacy Antibiotic Note  [name] is a 65 y.o. female admitted on 02/26/2021 with post-op wound infection - L foot.  Pharmacy has been consulted for Vancomycin dosing.  Noted pt with recent admission (~3 wks ago) and received Vancomycin/Cefepime while inpt and sent home on Augmentin.  Plan: Vancomycin 2000mg  IV q48h. Goal AUC 400-550. Expected AUC: 516 SCr used: 1.8; Vd coeff 0.5 Will f/u renal function, micro data, and pt's clinical condition Vanc levels at Css in obese pt F/u gram negative coverage  Addendum (9528) MD adding Cefepime per pharmacy Cefepime 2gm IV Q12h    Height: 5\' 8"  (172.7 cm) Weight: 130 kg (286 lb 9.6 oz) IBW/kg (Calculated) : 63.9  Temp (24hrs), Avg:98.2 F (36.8 C), Min:98.1 F (36.7 C), Max:98.3 F (36.8 C)  Recent Labs  Lab 02/26/21 2000  WBC 7.3  CREATININE 1.80*    Estimated Creatinine Clearance: 43.2 mL/min (A) (by C-G formula based on SCr of 1.8 mg/dL (H)).    Allergies  Allergen Reactions  . Nsaids     Kidney disease    Antimicrobials this admission: 5/24 Vanc >>  5/24 Zosyn x 1  Microbiology results: 5/23 BCx:   Thank you for allowing pharmacy to be a part of this patient's care.  Sherlon Handing, PharmD, BCPS Please see amion for complete clinical pharmacist phone list 02/27/2021 1:41 AM

## 2021-02-27 NOTE — ED Provider Notes (Signed)
[facility] EMERGENCY DEPARTMENT Provider Note   CSN: [id number] Arrival date & time: 02/26/21  1629     History Chief Complaint  Patient presents with  . Foot Pain    [name] is a 65 y.o. female.  Patient with past medical history notable for diabetes, with recent left foot osteomyelitis presents to the emergency department with a chief complaint of worsening redness and discharge from the left foot.  She states that she was seen by her podiatrist, and was told to come to the emergency department for IV antibiotics, hospital admission, and probable partial amputation.  She denies any fevers.  She states that she has peripheral neuropathy, and that the pain is not too bad.  She states that she noticed a split in her skin yesterday with discharge.  The history is provided by the patient. No language interpreter was used.       Past Medical History:  Diagnosis Date  . Acute myocardial infarction, subendocardial infarction, initial episode of care ([place]) 07/21/2012  . Acute osteomyelitis involving ankle and foot ([place]) 02/06/2015  . Acute systolic heart failure ([place]) 07/21/2012   New onset 07/19/12; admission to [facility] ED. Elevated Troponins.  S/p 2D-echo with EF 20-25%.  S/p cardiac catheterization with stenting LAD.  Repeat 2D-echo 10/2011 with improved EF of 35%.   . Anemia   . Automatic implantable cardioverter-defibrillator in situ    a. MDT CRT-D 06/2014, SN: [id number]  .removed in feb 2020  . CAD (coronary artery disease)    a. cardiac cath 101/04/2012: PCI/DES to chronically occluded mLAD, consideration PCI to diag branch in 4 weeks.   . Cataract   . Chicken pox   . Chronic kidney disease   . Chronic systolic CHF (congestive heart failure) (HCC)    a. mixed ICM & NICM; b. EF 20-25% by echo 07/2012, mid-dist 2/3 of LV sev HK/AK, mild MR. echo 10/2012: EF 30-35%, sev HK ant-septal & inf walls, GR1DD, mild MR, PASP 33. c. echo 02/2013: EF 30%, GR1DD,  mild MR. echo 04/2014: EF 30%, Septal-lat dyssynchrony, global HK, inf AK, GR1DD, mild MR. d. echo 10/2014: EF50-55%, WM nl, GR1DD, septal mild paradox. e. echo 02/2015: EF 50-55%, wm   . Depression   . Heart attack ([place])   . Heel ulcer ([place]) 04/27/2015  . History of blood transfusion ~ 2011   "plasma; had neuropathy; couldn't walk"  . Hypertension   . LBBB (left bundle branch block)   . Neuromuscular disorder ([place])   . Neuropathy 2011  . Obesity, unspecified   . OSA on CPAP    Moderate with AHI 23/hr and now on CPAP at 16cm H2O.  Her DME is [facility]  . Pure hypercholesterolemia   . Sepsis ([employer]) 09/2018  . Type II diabetes mellitus ([place])   . Unspecified vitamin D deficiency     Patient Active Problem List   Diagnosis Date Noted  . Osteomyelitis ([place]) 02/18/2021  . CKD (chronic kidney disease) stage 4, GFR 15-29 ml/min (HCC) 01/28/2021  . AICD (automatic cardioverter/defibrillator) present 01/28/2021  . Diabetic foot ulcer ([place]) 11/24/2020  . Advance care planning 10/04/2020  . Secondary hyperparathyroidism of renal origin ([place]) 07/20/2019  . Hyperkalemia 07/20/2019  . Benign hypertensive kidney disease with chronic kidney disease 07/20/2019  . Anemia in chronic kidney disease 07/20/2019  . Positive antinuclear antibody 07/20/2019  . Proteinuria 07/20/2019  . Acute on chronic systolic (congestive) heart failure ([place]) 03/23/2019  . UTI (urinary tract infection) 03/09/2019  .  CAD (coronary artery disease) 03/09/2019  . DM type 2, uncontrolled, with renal complications ([place]) 41/74/0814  . Atrial tachycardia ([place])   . Acute kidney injury superimposed on chronic kidney disease ([place])   . AKI (acute kidney injury) ([place])   . Weakness generalized 12/22/2018  . Shortness of breath 12/22/2018  . Palliative care encounter 12/22/2018  . Endocarditis 12/14/2018  . NSTEMI (non-ST elevated myocardial infarction) ([place]) 11/22/2018  . Acute on chronic combined systolic and diastolic CHF (congestive  heart failure) ([place]) 10/28/2018  . Lymphedema 10/28/2018  . Severe sepsis ([place]) 09/28/2018  . History of amputation of lesser toe of right foot ([place]) 04/19/2018  . Presence of cardiac resynchronization therapy defibrillator (CRT-D) 04/19/2018  . Paraparesis of both lower limbs ([place]) 03/29/2018  . 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 Paroxysmal atrial fibrillation ([place]) 03/29/2018  . Class 3 obesity due to excess calories with serious comorbidity and body mass index (BMI) of 40.0 to 44.9 in adult 09/02/2016  . History of CVA (cerebrovascular accident) 11/11/2015  . Chronic renal insufficiency 08/04/2015  . OSA (obstructive sleep apnea) 08/15/2014  . Morbid obesity ([place]) 10/26/2013  . Essential hypertension, benign 04/29/2013  . Pure hypercholesterolemia 12/07/2012  . Iron deficiency anemia 12/07/2012  . Diabetic peripheral neuropathy associated with type 2 diabetes mellitus ([place]) 12/07/2012  . Chronic systolic congestive heart failure ([place]) 07/29/2012  . Left bundle-branch block 07/25/2012  . Coronary artery disease involving native coronary artery of native heart without angina pectoris 07/25/2012    Past Surgical History:  Procedure Laterality Date  . ABDOMINAL AORTOGRAM W/LOWER EXTREMITY N/A 02/02/2021   Procedure: ABDOMINAL AORTOGRAM W/LOWER EXTREMITY;  Surgeon: Angelia Mould, MD;  Location: [facility];  Service: Cardiovascular;  Laterality: N/A;  . AMPUTATION TOE Right 06/18/2015   Procedure: AMPUTATION TOE;  Surgeon: Samara Deist, DPM;  Location: [facility] ORS;  Service: Podiatry;  Laterality: Right;  . AMPUTATION TOE Left 10/08/2018   Procedure: AMPUTATION TOE LEFT 2ND;  Surgeon: Albertine Patricia, DPM;  Location: [facility] ORS;  Service: Podiatry;  Laterality: Left;  . BACK SURGERY    . BI-VENTRICULAR IMPLANTABLE CARDIOVERTER DEFIBRILLATOR N/A 07/06/2014   Procedure: BI-VENTRICULAR IMPLANTABLE CARDIOVERTER DEFIBRILLATOR  (CRT-D);  Surgeon: Deboraha Sprang, MD;  Location:  [facility] CATH LAB;  Service: Cardiovascular;  Laterality: N/A;  . BI-VENTRICULAR IMPLANTABLE CARDIOVERTER DEFIBRILLATOR  (CRT-D)  07/06/2014  . BILATERAL OOPHORECTOMY  01/2011   ovarian cyst benign  . BIV ICD INSERTION CRT-D N/A 03/31/2019   Procedure: BIV ICD INSERTION CRT-D;  Surgeon: Constance Haw, MD;  Location: [facility];  Service: Cardiovascular;  Laterality: N/A;  . COLONOSCOPY WITH PROPOFOL Left 02/22/2015   Procedure: COLONOSCOPY WITH PROPOFOL;  Surgeon: Hulen Luster, MD;  Location: [facility] ENDOSCOPY;  Service: Endoscopy;  Laterality: Left;  . CORONARY ANGIOPLASTY WITH STENT PLACEMENT Left 07/2012   new onset systolic CHF; elevated troponins.  Cardiac catheterization with stenting to LAD; EF 15%.  2D-echo: EF 20-25%.  . ESOPHAGOGASTRODUODENOSCOPY N/A 02/22/2015   Procedure: ESOPHAGOGASTRODUODENOSCOPY (EGD);  Surgeon: Hulen Luster, MD;  Location: [facility] ENDOSCOPY;  Service: Endoscopy;  Laterality: N/A;  . I & D EXTREMITY Left 11/28/2020   Procedure: IRRIGATION AND DEBRIDEMENT OF FOOT;  Surgeon: Trula Slade, DPM;  Location: [facility];  Service: Podiatry;  Laterality: Left;  . I & D EXTREMITY Left 01/31/2021   Procedure: IRRIGATION AND DEBRIDEMENT EXTREMITY OF LEFT FOOT WITH BONE BIOPSY OF  1ST METATARSAL AND FIFTH METATARSAL;  Surgeon: Felipa Furnace, DPM;  Location: [facility]  OR;  Service: Podiatry;  Laterality: Left;  . I & D EXTREMITY Left 02/03/2021   Procedure: IRRIGATION AND DEBRIDEMENT EXTREMITY WITH INTERNAL AMPUTATION FIRST AND FIFTH RAY;  Surgeon: Felipa Furnace, DPM;  Location: [facility];  Service: Podiatry;  Laterality: Left;  . INCISION AND DRAINAGE ABSCESS Right 2007   groin; with ICU stay due to sepsis.  Marland Kitchen LAPAROSCOPIC CHOLECYSTECTOMY  2011  . LEFT HEART CATH AND CORONARY ANGIOGRAPHY N/A 10/05/2018   Procedure: LEFT HEART CATH AND CORONARY ANGIOGRAPHY;  Surgeon: Minna Merritts, MD;  Location: [facility];  Service: Cardiovascular;  Laterality: N/A;  . LEFT HEART  CATHETERIZATION WITH CORONARY ANGIOGRAM N/A 07/21/2012   Procedure: LEFT HEART CATHETERIZATION WITH CORONARY ANGIOGRAM;  Surgeon: Jolaine Artist, MD;  Location: [facility] CATH LAB;  Service: Cardiovascular;  Laterality: N/A;  . [place]   L4-5  . PACEMAKER REMOVAL  11/2018   due to infection around pacemaker  . PERCUTANEOUS CORONARY STENT INTERVENTION (PCI-S) N/A 07/23/2012   Procedure: PERCUTANEOUS CORONARY STENT INTERVENTION (PCI-S);  Surgeon: Sherren Mocha, MD;  Location: [facility] CATH LAB;  Service: Cardiovascular;  Laterality: N/A;  . PERIPHERAL VASCULAR BALLOON ANGIOPLASTY Left 10/06/2018   Procedure: PERIPHERAL VASCULAR BALLOON ANGIOPLASTY;  Surgeon: Katha Cabal, MD;  Location: [facility] LAB;  Service: Cardiovascular;  Laterality: Left;  . PERIPHERAL VASCULAR CATHETERIZATION N/A 02/10/2015   Procedure: Picc Line Insertion;  Surgeon: Katha Cabal, MD;  Location: [facility] LAB;  Service: Cardiovascular;  Laterality: N/A;  . RIGHT HEART CATH N/A 03/29/2019   Procedure: RIGHT HEART CATH;  Surgeon: Jolaine Artist, MD;  Location: [facility] LAB;  Service: Cardiovascular;  Laterality: N/A;  . TEE WITHOUT CARDIOVERSION N/A 10/02/2018   Procedure: TRANSESOPHAGEAL ECHOCARDIOGRAM (TEE);  Surgeon: Wellington Hampshire, MD;  Location: [facility] ORS;  Service: Cardiovascular;  Laterality: N/A;  . [place]  . VAGINAL HYSTERECTOMY  01/2011   Fibroids/DUB.  Ovaries removed. [place].     OB History   No obstetric history on file.     Family History  Problem Relation Age of Onset  . Diabetes Mother   . Hypertension Mother   . Arthritis Mother        knees, lumbar DDD, cervical DDD  . Cancer Father        prostate,skin,lymphoma.  . Cancer Brother 5       bladder cancer; non-smoker  . Diabetes Maternal Grandmother   . Heart disease Maternal Grandmother   . COPD Maternal Grandmother   . Diabetes Paternal Grandmother   . Obesity Brother   .  Diabetes Son   . Hypertension Son   . Cancer Maternal Grandfather   . Diabetes Paternal Grandfather     Social History   Tobacco Use  . Smoking status: Never Smoker  . Smokeless tobacco: Never Used  Vaping Use  . Vaping Use: Never used  Substance Use Topics  . Alcohol use: No    Alcohol/week: 0.0 standard drinks  . Drug use: No    Home Medications Prior to Admission medications   Medication Sig Start Date End Date Taking? Authorizing Provider  amiodarone (PACERONE) 200 MG tablet Take 1 tablet (200 mg total) by mouth daily. 10/30/20   Bensimhon, Shaune Pascal, MD  amoxicillin-clavulanate (AUGMENTIN) 500-125 MG tablet Take 1 tablet (500 mg total) by mouth every 12 (twelve) hours. 02/06/21   Nita Sells, MD  amoxicillin-clavulanate (AUGMENTIN) 875-125 MG tablet Take 1 tablet by mouth 2 (  two) times daily. 02/16/21   Felipa Furnace, DPM  aspirin EC 81 MG EC tablet Take 1 tablet (81 mg total) by mouth daily. Swallow whole. 02/07/21   Nita Sells, MD  atorvastatin (LIPITOR) 10 MG tablet Take 1 tablet (10 mg total) by mouth daily. 02/07/21   Nita Sells, MD  calcitRIOL (ROCALTROL) 0.5 MCG capsule Take 0.5 mcg by mouth every morning. 12/25/20   [provider]  Calcium Carb-Cholecalciferol (CALCIUM CARBONATE-VITAMIN D3) 600-400 MG-UNIT TABS Take 1 tablet by mouth 2 (two) times a day.    [provider]  carvedilol (COREG) 3.125 MG tablet Take 3.125 mg by mouth every morning.    [provider]  clopidogrel (PLAVIX) 75 MG tablet TAKE 1 TABLET BY MOUTH ONCE DAILY 11/10/20   Minna Merritts, MD  DULoxetine (CYMBALTA) 30 MG capsule Take 1 capsule (30 mg total) by mouth at bedtime. 01/26/21   Narda Amber K, DO  ferrous sulfate 325 (65 FE) MG tablet Take 325 mg by mouth every morning.    [provider]  gabapentin (NEURONTIN) 600 MG tablet Take 1 tablet (600 mg total) by mouth at bedtime. 02/15/21   [name], MD  insulin aspart (NOVOLOG  FLEXPEN) 100 UNIT/ML FlexPen Inject 5-10 Units into the skin 3 (three) times daily before meals. Sugar 100-149 - 5 units.  150-199- 6 units.  200-249- 7 units.  250-299- 8 units. 300-349- 9 units.  350+ 10 units. 02/15/21   [name], MD  Insulin Pen Needle (PEN NEEDLES) 32G X 6 MM MISC 1 each by Does not apply route 4 (four) times daily. 09/06/19   Daleen Squibb, MD  isosorbide mononitrate (IMDUR) 30 MG 24 hr tablet Take 1 tablet (30 mg total) by mouth daily. Needs appt 05/16/20   Bensimhon, Shaune Pascal, MD  LANTUS SOLOSTAR 100 UNIT/ML Solostar Pen INJECT 22 UNITS UNDER THE SKIN DAILY. 10/02/20   [name], MD  levothyroxine (SYNTHROID) 50 MCG tablet TAKE 1 TABLET BY MOUTH ONCE DAILY BEFOREBREAKFAST 10/30/20   [name], MD  Multiple Vitamin (MULTIVITAMIN WITH MINERALS) TABS tablet Take 1 tablet by mouth every morning.    [provider]  nitroGLYCERIN (NITROSTAT) 0.4 MG SL tablet Place 1 tablet (0.4 mg total) under the tongue every 5 (five) minutes as needed for chest pain. 03/28/20 06/26/20  Deboraha Sprang, MD  Omega-3 Fatty Acids (FISH OIL) 1000 MG CAPS Take 1,000 mg by mouth 2 (two) times a day.    [provider]  torsemide (DEMADEX) 20 MG tablet Take 1 tablet (20 mg total) by mouth daily as needed (For fluid). 02/18/21   [name], MD  traMADol (ULTRAM) 50 MG tablet Take 1-2 tablets (50-100 mg total) by mouth at bedtime as needed. 02/15/21   [name], MD    Allergies    Nsaids  Review of Systems   Review of Systems  All other systems reviewed and are negative.   Physical Exam Updated Vital Signs BP 123/65   Pulse 63   Temp 98.1 F (36.7 C) (Oral)   Resp 17   Wt 130 kg   SpO2 96%   BMI 43.58 kg/m   Physical Exam Vitals and nursing note reviewed.  Constitutional:      General: She is not in acute distress.    Appearance: She is well-developed.  HENT:     Head: Normocephalic and atraumatic.  Eyes:     Conjunctiva/sclera:  Conjunctivae normal.  Cardiovascular:     Rate and Rhythm: Normal rate and regular rhythm.     Heart sounds: No murmur heard.   Pulmonary:     Effort: Pulmonary effort is normal. No respiratory distress.     Breath sounds: Normal breath sounds.  Abdominal:     Palpations: Abdomen is soft.     Tenderness: There is no abdominal tenderness.  Musculoskeletal:        General: Normal range of motion.     Cervical back: Neck supple.  Skin:    General: Skin is warm and dry.     Comments: Left foot as pictured  Neurological:     Mental Status: She is alert and oriented to person, place, and time.  Psychiatric:        Mood and Affect: Mood normal.        Behavior: Behavior normal.         ED Results / Procedures / Treatments   Labs (all labs ordered are listed, but only abnormal results are displayed) Labs Reviewed  COMPREHENSIVE METABOLIC PANEL - Abnormal; Notable for the following components:      Result Value   Glucose, Bld 154 (*)    BUN 31 (*)    Creatinine, Ser 1.80 (*)    GFR, Estimated 30 (*)    All other components within normal limits  CBC WITH DIFFERENTIAL/PLATELET - Abnormal; Notable for the following components:   RBC 3.77 (*)    Hemoglobin 11.5 (*)    All other components within normal limits  CULTURE, BLOOD (ROUTINE X 2)  CULTURE, BLOOD (ROUTINE X 2)  RESP PANEL BY RT-PCR (FLU A&B, COVID) ARPGX2    EKG None  Radiology DG Foot Complete Left  Result Date: 02/26/2021 CLINICAL DATA:  65 year old female with concern for infection EXAM: LEFT FOOT - COMPLETE 3+ VIEW COMPARISON:  Left foot radiograph dated 02/03/2021. FINDINGS: Status post prior amputation of proximal half of the proximal phalanx of the great toe and distal half of the fifth metatarsal as well as amputation of the phalanges of the second toe. There is slight fragmentation of the amputated edge of the proximal phalanx of the great toe. No acute fracture or dislocation. The bones are osteopenic.  There is soft tissue swelling of the forefoot. No radiopaque foreign object or soft tissue gas. IMPRESSION: 1. Postsurgical changes of the forefoot as seen on the prior radiograph. No acute fracture or dislocation. MRI or a white blood cell nuclear scan may provide better evaluation if there is high clinical concern for acute osteomyelitis. 2. Diffuse soft tissue swelling of the forefoot. No soft tissue gas. Electronically Signed   By: Anner Crete M.D.   On: 02/26/2021 20:12    Procedures Procedures   Medications Ordered in ED Medications  piperacillin-tazobactam (ZOSYN) IVPB 3.375 g (has no administration in time range)  vancomycin (VANCOREADY) IVPB 1000 mg/200 mL (has no administration in time range)    ED Course  I have reviewed the triage vital signs and the nursing notes.  Pertinent labs & imaging results that were available during my care of the patient were reviewed by me and considered in my medical decision making (see chart for details).    MDM Rules/Calculators/A&P                          This patient complains of left foot infection, this involves an extensive number of treatment options, and is a complaint that carries with  it a high risk of complications and morbidity.    Differential Dx Osteo, cellulitis, abscess, gout  Pertinent Labs I ordered, reviewed, and interpreted labs, which were notable for: COVID negative No leukocytosis Cr elevated at 1.8, but suspect this is near patient's baseline.  Imaging Interpretation I ordered imaging studies which included plan films of the left foot, which showed  No obvious osteo, but MRI recommended..   Medications I ordered medication antibiotics and pain meds.  Consultants Dr. Hal Hope, who is appreciated for admitting.  Plan Admit    Final Clinical Impression(s) / ED Diagnoses Final diagnoses:  Toe infection    Rx / DC Orders ED Discharge Orders    None       Montine Circle, Hershal Coria [birth year]  [id number]    Delora Fuel, MD 95/58/31 [phone]

## 2021-02-27 NOTE — Consult Note (Signed)
Reason for Consult: Cellulitis  Referring Physician: Dr. Marylu Lund, MD  [name] is an 65 y.o. female.  HPI: 65 y.o.femalewithhistory of nonischemic cardiomyopathy status post AICD, atrial fibrillation, diabetes mellitus, sleep apnea, hypothyroidism who was admitted last month for left foot osteomyelitis status post bone resection of the fifth proximal phalanx and metatarsal and sesamoidectomy also had vascular procedure was eventually discharged home on Feb 06, 2021 about 3 weeks ago and antibiotics till patient is on antibiotics started noticing increasing discharge.  At that time she was seen in the office by Dr. Sherryle Lis was sent to the hospital given cellulitis.  She had x-rays as well as CT scan.  Upon admission to the hospital her white blood count was 7.3, afebrile.  Past Medical History:  Diagnosis Date  . Acute myocardial infarction, subendocardial infarction, initial episode of care ([place]) 07/21/2012  . Acute osteomyelitis involving ankle and foot ([place]) 02/06/2015  . Acute systolic heart failure ([place]) 07/21/2012   New onset 07/19/12; admission to [facility] ED. Elevated Troponins.  S/p 2D-echo with EF 20-25%.  S/p cardiac catheterization with stenting LAD.  Repeat 2D-echo 10/2011 with improved EF of 35%.   . Anemia   . Automatic implantable cardioverter-defibrillator in situ    a. MDT CRT-D 06/2014, SN: [id number]  .removed in feb 2020  . CAD (coronary artery disease)    a. cardiac cath 101/04/2012: PCI/DES to chronically occluded mLAD, consideration PCI to diag branch in 4 weeks.   . Cataract   . Chicken pox   . Chronic kidney disease   . Chronic systolic CHF (congestive heart failure) (HCC)    a. mixed ICM & NICM; b. EF 20-25% by echo 07/2012, mid-dist 2/3 of LV sev HK/AK, mild MR. echo 10/2012: EF 30-35%, sev HK ant-septal & inf walls, GR1DD, mild MR, PASP 33. c. echo 02/2013: EF 30%, GR1DD, mild MR. echo 04/2014: EF 30%, Septal-lat dyssynchrony, global HK, inf AK, GR1DD,  mild MR. d. echo 10/2014: EF50-55%, WM nl, GR1DD, septal mild paradox. e. echo 02/2015: EF 50-55%, wm   . Depression   . Heart attack ([place])   . Heel ulcer ([place]) 04/27/2015  . History of blood transfusion ~ 2011   "plasma; had neuropathy; couldn't walk"  . Hypertension   . LBBB (left bundle branch block)   . Neuromuscular disorder ([place])   . Neuropathy 2011  . Obesity, unspecified   . OSA on CPAP    Moderate with AHI 23/hr and now on CPAP at 16cm H2O.  Her DME is [facility]  . Pure hypercholesterolemia   . Sepsis ([place]) 09/2018  . Type II diabetes mellitus ([place])   . Unspecified vitamin D deficiency     Past Surgical History:  Procedure Laterality Date  . ABDOMINAL AORTOGRAM W/LOWER EXTREMITY N/A 02/02/2021   Procedure: ABDOMINAL AORTOGRAM W/LOWER EXTREMITY;  Surgeon: Angelia Mould, MD;  Location: [place] [facility];  Service: Cardiovascular;  Laterality: N/A;  . AMPUTATION TOE Right 06/18/2015   Procedure: AMPUTATION TOE;  Surgeon: Samara Deist, DPM;  Location: [facility] ORS;  Service: Podiatry;  Laterality: Right;  . AMPUTATION TOE Left 10/08/2018   Procedure: AMPUTATION TOE LEFT 2ND;  Surgeon: Albertine Patricia, DPM;  Location: [facility] OR[facility];  Service: Podiatry;  Laterality: Left;  . BACK SURGERY    . BI-VENTRICULAR IMPLANTABLE CARDIOVERTER DEFIBRILLATOR N/A 07/06/2014   Procedure: BI-VENTRICULAR IMPLANTABLE CARDIOVERTER DEFIBRILLATOR  (CRT-D);  Surgeon: Deboraha Sprang, MD;  Location: [facility] CATH LAB;  Service: Cardiovascular;  Laterality: N/A;  . BI-VENTRICULAR  IMPLANTABLE CARDIOVERTER DEFIBRILLATOR  (CRT-D)  07/06/2014  . BILATERAL OOPHORECTOMY  01/2011   ovarian cyst benign  . BIV ICD INSERTION CRT-D N/A 03/31/2019   Procedure: BIV ICD INSERTION CRT-D;  Surgeon: Constance Haw, MD;  Location: [place] [facility];  Service: Cardiovascular;  Laterality: N/A;  . COLONOSCOPY WITH PROPOFOL Left 02/22/2015   Procedure: COLONOSCOPY WITH PROPOFOL;  Surgeon: Hulen Luster, MD;  Location: [facility] ENDOSCOPY;   Service: Endoscopy;  Laterality: Left;  . CORONARY ANGIOPLASTY WITH STENT PLACEMENT Left 07/2012   new onset systolic CHF; elevated troponins.  Cardiac catheterization with stenting to LAD; EF 15%.  2D-echo: EF 20-25%.  . ESOPHAGOGASTRODUODENOSCOPY N/A 02/22/2015   Procedure: ESOPHAGOGASTRODUODENOSCOPY (EGD);  Surgeon: Hulen Luster, MD;  Location: [facility] ENDOSCOPY;  Service: Endoscopy;  Laterality: N/A;  . I & D EXTREMITY Left 11/28/2020   Procedure: IRRIGATION AND DEBRIDEMENT OF FOOT;  Surgeon: Trula Slade, DPM;  Location: [place];  Service: Podiatry;  Laterality: Left;  . I & D EXTREMITY Left 01/31/2021   Procedure: IRRIGATION AND DEBRIDEMENT EXTREMITY OF LEFT FOOT WITH BONE BIOPSY OF  1ST METATARSAL AND FIFTH METATARSAL;  Surgeon: Felipa Furnace, DPM;  Location: [place];  Service: Podiatry;  Laterality: Left;  . I & D EXTREMITY Left 02/03/2021   Procedure: IRRIGATION AND DEBRIDEMENT EXTREMITY WITH INTERNAL AMPUTATION FIRST AND FIFTH RAY;  Surgeon: Felipa Furnace, DPM;  Location: [place];  Service: Podiatry;  Laterality: Left;  . INCISION AND DRAINAGE ABSCESS Right 2007   groin; with ICU stay due to sepsis.  Marland Kitchen LAPAROSCOPIC CHOLECYSTECTOMY  2011  . LEFT HEART CATH AND CORONARY ANGIOGRAPHY N/A 10/05/2018   Procedure: LEFT HEART CATH AND CORONARY ANGIOGRAPHY;  Surgeon: Minna Merritts, MD;  Location: [place] [facility];  Service: Cardiovascular;  Laterality: N/A;  . LEFT HEART CATHETERIZATION WITH CORONARY ANGIOGRAM N/A 07/21/2012   Procedure: LEFT HEART CATHETERIZATION WITH CORONARY ANGIOGRAM;  Surgeon: Jolaine Artist, MD;  Location: [facility] CATH LAB;  Service: Cardiovascular;  Laterality: N/A;  . [place]   L4-5  . PACEMAKER REMOVAL  11/2018   due to infection around pacemaker  . PERCUTANEOUS CORONARY STENT INTERVENTION (PCI-S) N/A 07/23/2012   Procedure: PERCUTANEOUS CORONARY STENT INTERVENTION (PCI-S);  Surgeon: Sherren Mocha, MD;  Location: [facility] CATH LAB;  Service:  Cardiovascular;  Laterality: N/A;  . PERIPHERAL VASCULAR BALLOON ANGIOPLASTY Left 10/06/2018   Procedure: PERIPHERAL VASCULAR BALLOON ANGIOPLASTY;  Surgeon: Katha Cabal, MD;  Location: [place] [facility];  Service: Cardiovascular;  Laterality: Left;  . PERIPHERAL VASCULAR CATHETERIZATION N/A 02/10/2015   Procedure: Picc Line Insertion;  Surgeon: Katha Cabal, MD;  Location: [place] [facility] LAB;  Service: Cardiovascular;  Laterality: N/A;  . RIGHT HEART CATH N/A 03/29/2019   Procedure: RIGHT HEART CATH;  Surgeon: Jolaine Artist, MD;  Location: [place] [facility];  Service: Cardiovascular;  Laterality: N/A;  . TEE WITHOUT CARDIOVERSION N/A 10/02/2018   Procedure: TRANSESOPHAGEAL ECHOCARDIOGRAM (TEE);  Surgeon: Wellington Hampshire, MD;  Location: [facility] ORS;  Service: Cardiovascular;  Laterality: N/A;  . [place]  . VAGINAL HYSTERECTOMY  01/2011   Fibroids/DUB.  Ovaries removed. [place].    Family History  Problem Relation Age of Onset  . Diabetes Mother   . Hypertension Mother   . Arthritis Mother        knees, lumbar DDD, cervical DDD  . Cancer Father        prostate,skin,lymphoma.  . Cancer Brother  57       bladder cancer; non-smoker  . Diabetes Maternal Grandmother   . Heart disease Maternal Grandmother   . COPD Maternal Grandmother   . Diabetes Paternal Grandmother   . Obesity Brother   . Diabetes Son   . Hypertension Son   . Cancer Maternal Grandfather   . Diabetes Paternal Grandfather     Social History:  reports that she has never smoked. She has never used smokeless tobacco. She reports that she does not drink alcohol and does not use drugs.  Allergies:  Allergies  Allergen Reactions  . Nsaids     Kidney disease    Medications: I have reviewed the patient's current medications.  Results for orders placed or performed during the hospital encounter of 02/26/21 (from the past 48 hour(s))  Culture, blood (routine x 2)     Status: None  (Preliminary result)   Collection Time: 02/26/21  7:00 PM   Specimen: BLOOD RIGHT ARM  Result Value Ref Range   Specimen Description BLOOD RIGHT ARM    Special Requests      BOTTLES DRAWN AEROBIC AND ANAEROBIC Blood Culture adequate volume   Culture      NO GROWTH < 12 HOURS Performed at [facility] [address], [place], [place] [address]    Report Status PENDING   Culture, blood (routine x 2)     Status: None (Preliminary result)   Collection Time: 02/26/21  7:15 PM   Specimen: BLOOD LEFT ARM  Result Value Ref Range   Specimen Description BLOOD LEFT ARM    Special Requests      BOTTLES DRAWN AEROBIC ONLY Blood Culture results may not be optimal due to an excessive volume of blood received in culture bottles   Culture      NO GROWTH < 12 HOURS Performed at [facility] [address], [place], [place] [address]    Report Status PENDING   Comprehensive metabolic panel     Status: Abnormal   Collection Time: 02/26/21  8:00 PM  Result Value Ref Range   Sodium 138 135 - 145 mmol/L   Potassium 4.8 3.5 - 5.1 mmol/L   Chloride 103 98 - 111 mmol/L   CO2 25 22 - 32 mmol/L   Glucose, Bld 154 (H) 70 - 99 mg/dL    Comment: Glucose reference range applies only to samples taken after fasting for at least 8 hours.   BUN 31 (H) 8 - 23 mg/dL   Creatinine, Ser 1.80 (H) 0.44 - 1.00 mg/dL   Calcium 9.6 8.9 - 10.3 mg/dL   Total Protein 7.2 6.5 - 8.1 g/dL   Albumin 3.5 3.5 - 5.0 g/dL   AST 17 15 - 41 U/L   ALT 15 0 - 44 U/L   Alkaline Phosphatase 64 38 - 126 U/L   Total Bilirubin 0.7 0.3 - 1.2 mg/dL   GFR, Estimated 30 (L) >60 mL/min    Comment: (NOTE) Calculated using the CKD-EPI Creatinine Equation (2021)    Anion gap 10 5 - 15    Comment: Performed at [facility] [address] [place], [place] [address]  CBC with Differential     Status: Abnormal   Collection Time: 02/26/21  8:00 PM  Result Value Ref Range   WBC 7.3 4.0 - 10.5 K/uL   RBC 3.77 (L) 3.87 -  5.11 MIL/uL   Hemoglobin 11.5 (L) 12.0 - 15.0 g/dL   HCT 36.3 36.0 -  46.0 %   MCV 96.3 80.0 - 100.0 fL   MCH 30.5 26.0 - 34.0 pg   MCHC 31.7 30.0 - 36.0 g/dL   RDW 13.5 11.5 - 15.5 %   Platelets 184 150 - 400 K/uL   nRBC 0.0 0.0 - 0.2 %   Neutrophils Relative % 63 %   Neutro Abs 4.5 1.7 - 7.7 K/uL   Lymphocytes Relative 29 %   Lymphs Abs 2.1 0.7 - 4.0 K/uL   Monocytes Relative 7 %   Monocytes Absolute 0.5 0.1 - 1.0 K/uL   Eosinophils Relative 1 %   Eosinophils Absolute 0.1 0.0 - 0.5 K/uL   Basophils Relative 0 %   Basophils Absolute 0.0 0.0 - 0.1 K/uL   Immature Granulocytes 0 %   Abs Immature Granulocytes 0.01 0.00 - 0.07 K/uL    Comment: Performed at [facility] [address] [place], [place] [address]  Resp Panel by RT-PCR (Flu A&B, Covid) Nasopharyngeal Swab     Status: None   Collection Time: 02/27/21  3:22 AM   Specimen: Nasopharyngeal Swab; Nasopharyngeal(NP) swabs in vial transport medium  Result Value Ref Range   SARS Coronavirus 2 by RT PCR NEGATIVE NEGATIVE    Comment: (NOTE) SARS-CoV-2 target nucleic acids are NOT DETECTED.  The SARS-CoV-2 RNA is generally detectable in upper respiratory specimens during the acute phase of infection. The lowest concentration of SARS-CoV-2 viral copies this assay can detect is 138 copies/mL. A negative result does not preclude SARS-Cov-2 infection and should not be used as the sole basis for treatment or other patient management decisions. A negative result may occur with  improper specimen collection/handling, submission of specimen other than nasopharyngeal swab, presence of viral mutation(s) within the areas targeted by this assay, and inadequate number of viral copies(<138 copies/mL). A negative result must be combined with clinical observations, patient history, and epidemiological information. The expected result is Negative.  Fact Sheet for Patients:  EntrepreneurPulse.com.au  Fact Sheet for  Healthcare Providers:  IncredibleEmployment.be  This test is no t yet approved or cleared by the [place] FDA and  has been authorized for detection and/or diagnosis of SARS-CoV-2 by FDA under an Emergency Use Authorization (EUA). This EUA will remain  in effect (meaning this test can be used) for the duration of the COVID-19 declaration under Section 564(b)(1) of the Act, 21 U.S.C.section 360bbb-3(b)(1), unless the authorization is terminated  or revoked sooner.       Influenza A by PCR NEGATIVE NEGATIVE   Influenza B by PCR NEGATIVE NEGATIVE    Comment: (NOTE) The Xpert Xpress SARS-CoV-2/FLU/RSV plus assay is intended as an aid in the diagnosis of influenza from Nasopharyngeal swab specimens and should not be used as a sole basis for treatment. Nasal washings and aspirates are unacceptable for Xpert Xpress SARS-CoV-2/FLU/RSV testing.  Fact Sheet for Patients: EntrepreneurPulse.com.au  Fact Sheet for Healthcare Providers: IncredibleEmployment.be  This test is not yet approved or cleared by the [place] FDA and has been authorized for detection and/or diagnosis of SARS-CoV-2 by FDA under an Emergency Use Authorization (EUA). This EUA will remain in effect (meaning this test can be used) for the duration of the COVID-19 declaration under Section 564(b)(1) of the Act, 21 U.S.C. section 360bbb-3(b)(1), unless the authorization is terminated or revoked.  Performed at [facility], [place] [address] [place], [place] [address]   CBG monitoring, ED     Status: Abnormal   Collection Time: 02/27/21  6:39 AM  Result Value Ref Range   Glucose-Capillary 131 (H) 70 - 99 mg/dL    Comment: Glucose reference range applies only to samples taken after fasting for at least 8 hours.  CBG monitoring, ED     Status: Abnormal   Collection Time: 02/27/21  7:52 AM  Result Value Ref Range   Glucose-Capillary 120 (H) 70 - 99 mg/dL     Comment: Glucose reference range applies only to samples taken after fasting for at least 8 hours.  CBG monitoring, ED     Status: Abnormal   Collection Time: 02/27/21 10:35 AM  Result Value Ref Range   Glucose-Capillary 130 (H) 70 - 99 mg/dL    Comment: Glucose reference range applies only to samples taken after fasting for at least 8 hours.   *Note: Due to a large number of results and/or encounters for the requested time period, some results have not been displayed. A complete set of results can be found in Results Review.    CT FOOT LEFT WO CONTRAST  Result Date: 02/27/2021 CLINICAL DATA:  Osteomyelitis of foot. Recent left foot surgery 4 weeks ago. EXAM: CT OF THE LEFT FOOT WITHOUT CONTRAST TECHNIQUE: Multidetector CT imaging of the left foot was performed according to the standard protocol. Multiplanar CT image reconstructions were also generated. COMPARISON:  X-ray left foot 02/26/2021 FINDINGS: Bones/Joint/Cartilage Partially amputation of the first digit proximal phalanx. Indication of second digit phalanges. Amputation of the proximal phalanx and partial metatarsal of the fifth digit. Cortical irregularity with several fracture fragments along the first digit and fifth digit amputations with no definite cortical erosion or destruction. No acute displaced fracture or dislocation. Ligaments Suboptimally assessed by CT. Muscles and Tendons Atrophic. Soft tissues Subcutaneus soft tissue edema as well as couple foci of soft tissue emphysema along the first digit metatarsal head. Edema fifth digit. Soft tissue defect of the forefoot. IMPRESSION: Postsurgical forefoot surgical changes with several fracture fragments along the first digit and fifth digit amputations with no definite cortical erosion or destruction. Associated subcutaneus soft tissue edema and emphysema in a patient with a soft tissue defect. Recommend MRI (with intravenous contrast if GFR greater than 30) for further evaluation of  possible osteomyelitis. Electronically Signed   By: Iven Finn M.D.   On: 02/27/2021 04:38   DG Foot Complete Left  Result Date: 02/26/2021 CLINICAL DATA:  65 year old female with concern for infection EXAM: LEFT FOOT - COMPLETE 3+ VIEW COMPARISON:  Left foot radiograph dated 02/03/2021. FINDINGS: Status post prior amputation of proximal half of the proximal phalanx of the great toe and distal half of the fifth metatarsal as well as amputation of the phalanges of the second toe. There is slight fragmentation of the amputated edge of the proximal phalanx of the great toe. No acute fracture or dislocation. The bones are osteopenic. There is soft tissue swelling of the forefoot. No radiopaque foreign object or soft tissue gas. IMPRESSION: 1. Postsurgical changes of the forefoot as seen on the prior radiograph. No acute fracture or dislocation. MRI or a white blood cell nuclear scan may provide better evaluation if there is high clinical concern for acute osteomyelitis. 2. Diffuse soft tissue swelling of the forefoot. No soft tissue gas. Electronically Signed   By: Anner Crete M.D.   On: 02/26/2021 20:12    Review of Systems Blood pressure (!) 133/54, pulse (!) 58, temperature 98 F (36.7 C), temperature source Oral, resp. rate 18, height 5\' 8"  (1.727 m), weight 130 kg, SpO2 99 %.  Physical Exam General: AAO x3, NAD-in the stretcher in the emergency department  Dermatological: Incision over plantar aspect of the left foot submetatarsal phalangeal joint of the first with sutures intact with wound dehiscence.  No purulence identified but there is probing noted.  Localized edema and erythema mostly on the first MPJ.  Sutures intact along the fifth digit without any significant erythema.  There is no fluctuance or crepitation.  There is no malodor.  On the anterior left leg there is a old hematoma from when she hit her leg during last hospitalization on a wheelchair she reports.  Vascular: Dorsalis  Pedis artery and Posterior Tibial artery pedal pulses are palpable bilateral with immedate capillary fill time.   Neruologic: Sensation decreased.  Musculoskeletal: No significant tenderness palpation   Assessment/Plan: Left foot cellulitis with nonhealing ulcer  She has been afebrile and white blood cell count is normal.  X-ray showed osseous fragments but no definitive evidence of osteomyelitis.  CT scan did show cortical irregularity with fracture fragments along the first and fifth digit with no cortical erosion or destruction.  Given the findings continue IV antibiotics for now.  I discussed with her surgical intervention to either debride the wound versus going to proceed with amputation of the big toe given the ongoing issues of the wound and reoccurrence of infection.  The fifth toe appears to be stable.    She is scheduled to have repeat arterial studies on July 1.  Would recommend pulmonary repeating this while in the hospital.  Will continue to follow  Trula Slade 02/27/2021, 11:44 AM

## 2021-02-27 NOTE — Progress Notes (Signed)
  Subjective:  Patient ID: [name], female    DOB: 04-09-1956,  MRN: [id number]  Chief Complaint  Patient presents with  . Post-op Problem    (urgent work in) patient has an open area near the surgical site POV #2 DOS 02/03/2021 s/p I&D left foot    65 y.o. female presents with the above complaint. History confirmed with patient.  Returns for urgent visit because the wound opened up.  Hematoma still present left.  She has been on Augmentin and   Objective:  Physical Exam: Left foot cellulitis to midfoot, central plantar wound dehiscence and probes directly to metatarsal, large hematoma still present left leg beginning to necrosis of skin      Assessment:   1. Hematoma of left lower leg      Plan:  Patient was evaluated and treated and all questions answered.  Evaluated and discussed with the patient that she has a dehiscence of the wound probes directly to the bone.  Likely high chance of infection at this point with cellulitis bone.  Hematoma is also looking worse.  I recommend admitted to the hospital for possible surgical intervention and IV antibiotics.  She went to the hospital directly after the visit  No follow-ups on file.

## 2021-02-27 NOTE — Progress Notes (Signed)
PROGRESS NOTE    [name]  UUE:[id number] DOB: [birth year] DOA: 02/26/2021 PCP: [name], MD    Brief Narrative:  65 y.o. female with history of nonischemic cardiomyopathy status post AICD, atrial fibrillation, diabetes mellitus, sleep apnea, hypothyroidism who was admitted last month for left foot osteomyelitis status post bone resection of the fifth proximal phalanx and metatarsal and sesamoidectomy also had vascular procedure was eventually discharged home on Feb 06, 2021 about 3 weeks ago and antibiotics till patient is on antibiotics started noticing increasing discharge that has bloody since yesterday morning.  Has subjective feeling of fever chills.  Increasing pain.  The discharge mostly on the plantar aspect of the first metatarsal head.  Patient had gone to her podiatrist office and was referred to the ER.  In the ED, pt had findings worrisome for osteomyelitis involving L foot.  Assessment & Plan:   Principal Problem:   Cellulitis of left foot Active Problems:   Essential hypertension, benign   OSA (obstructive sleep apnea)   History of CVA (cerebrovascular accident)   Paroxysmal atrial fibrillation (HCC)   DM type 2, uncontrolled, with renal complications (HCC)   CKD (chronic kidney disease) stage 4, GFR 15-29 ml/min (HCC)   Osteomyelitis (HCC)   Cellulitis  1. Cellulitis of the left foot with concerning features for possible osteomyelitis 1. Pt is continued on cefepime and vanc 2. Have consulted podiatry. Discussed case with [name]. Plan for now is to continue on empiric abx  3. Podiatry to later determine if pt would require surgery. Would f/u on podiatry recs 4. Repeat bmet and cbc 2. Peripheral artery disease status post recent angioplasty. 1. Seems stable at this time 3. Diabetes mellitus type 2  1. on Lantus insulin 22 units at night.   2. Continue on SSI coverage 4. Nonischemic cardiomyopathy  1. Appears compensated on exam 2. At home, on  torsemide PRN swelling 3. Would monitor for now 4. Repeat bmet in AM 5. Paroxysmal atrial fibrillation on amiodarone which will be continued.   1. Not on anticoagulation. 6. Chronic kidney disease stage IV  1. Presenting creatinine appears to be at baseline when compared to recent past 2. Will repeat bmet in AM. 7. Anemia of chronic disease and renal disease 1. Repeat cbc in AM 8. History of sleep apnea 1. Continue on CPAP. 9. Hypothyroidism 1. Continue Synthroid per home regimen  DVT prophylaxis: SCD's Code Status: Full Family Communication: Pt in room, family not at bedside  Status is: Inpatient  Remains inpatient appropriate because:Inpatient level of care appropriate due to severity of illness   Dispo: The patient is from: Home              Anticipated d/c is to: Home              Patient currently is not medically stable to d/c.   Difficult to place patient No    Consultants:   Podiatry  Procedures:     Antimicrobials: Anti-infectives (From admission, onward)   Start     Dose/Rate Route Frequency Ordered Stop   03/01/21 0400  vancomycin (VANCOREADY) IVPB 2000 mg/400 mL        2,000 mg 200 mL/hr over 120 Minutes Intravenous Every 48 hours 02/27/21 0147     02/27/21 0400  ceFEPIme (MAXIPIME) 2 g in sodium chloride 0.9 % 100 mL IVPB        2 g 200 mL/hr over 30 Minutes Intravenous Every 12 hours 02/27/21 0337  02/27/21 0345  vancomycin (VANCOREADY) IVPB 1000 mg/200 mL  Status:  Discontinued        1,000 mg 200 mL/hr over 60 Minutes Intravenous  Once 02/27/21 0332 02/27/21 0335   02/27/21 0345  ceFEPIme (MAXIPIME) 2 g in sodium chloride 0.9 % 100 mL IVPB  Status:  Discontinued        2 g 200 mL/hr over 30 Minutes Intravenous  Once 02/27/21 0332 02/27/21 0336   02/27/21 0145  piperacillin-tazobactam (ZOSYN) IVPB 3.375 g        3.375 g 100 mL/hr over 30 Minutes Intravenous  Once 02/27/21 0135 02/27/21 0336   02/27/21 0145  vancomycin (VANCOREADY) IVPB 1000  mg/200 mL  Status:  Discontinued        1,000 mg 200 mL/hr over 60 Minutes Intravenous  Once 02/27/21 0135 02/27/21 0141   02/27/21 0145  vancomycin (VANCOREADY) IVPB 2000 mg/400 mL        2,000 mg 200 mL/hr over 120 Minutes Intravenous  Once 02/27/21 0141 02/27/21 0605       Subjective: Without complaints at this time  Objective: Vitals:   02/27/21 1000 02/27/21 1133 02/27/21 1322 02/27/21 1515  BP: (!) 138/51 (!) 133/54 127/60 127/62  Pulse: (!) 55 (!) 58 68 62  Resp: 18 18 17 14   Temp:  98 F (36.7 C)    TempSrc:  Oral    SpO2: 99%  99% 99%  Weight:      Height:       No intake or output data in the 24 hours ending 02/27/21 1527 Filed Weights   02/27/21 0100  Weight: 130 kg    Examination: General exam: Awake, laying in bed, in nad Respiratory system: Normal respiratory effort, no wheezing Cardiovascular system: regular rate, s1, s2 Gastrointestinal system: Soft, nondistended, positive BS Central nervous system: CN2-12 grossly intact, strength intact Extremities: Perfused, no clubbing, s/p partial L foot amputation Skin: Normal skin turgor, erythema over L foot Psychiatry: Mood normal // no visual hallucinations    Data Reviewed: I have personally reviewed following labs and imaging studies  CBC: Recent Labs  Lab 02/26/21 2000  WBC 7.3  NEUTROABS 4.5  HGB 11.5*  HCT 36.3  MCV 96.3  PLT 063   Basic Metabolic Panel: Recent Labs  Lab 02/26/21 2000  NA 138  K 4.8  CL 103  CO2 25  GLUCOSE 154*  BUN 31*  CREATININE 1.80*  CALCIUM 9.6   GFR: Estimated Creatinine Clearance: 43.2 mL/min (A) (by C-G formula based on SCr of 1.8 mg/dL (H)). Liver Function Tests: Recent Labs  Lab 02/26/21 2000  AST 17  ALT 15  ALKPHOS 64  BILITOT 0.7  PROT 7.2  ALBUMIN 3.5   No results for input(s): LIPASE, AMYLASE in the last 168 hours. No results for input(s): AMMONIA in the last 168 hours. Coagulation Profile: No results for input(s): INR, PROTIME in the  last 168 hours. Cardiac Enzymes: No results for input(s): CKTOTAL, CKMB, CKMBINDEX, TROPONINI in the last 168 hours. BNP (last 3 results) No results for input(s): PROBNP in the last 8760 hours. HbA1C: No results for input(s): HGBA1C in the last 72 hours. CBG: Recent Labs  Lab 02/27/21 0639 02/27/21 0752 02/27/21 1035 02/27/21 1256  GLUCAP 131* 120* 130* 137*   Lipid Profile: No results for input(s): CHOL, HDL, LDLCALC, TRIG, CHOLHDL, LDLDIRECT in the last 72 hours. Thyroid Function Tests: No results for input(s): TSH, T4TOTAL, FREET4, T3FREE, THYROIDAB in the last 72 hours. Anemia Panel: No results  for input(s): VITAMINB12, FOLATE, FERRITIN, TIBC, IRON, RETICCTPCT in the last 72 hours. Sepsis Labs: No results for input(s): PROCALCITON, LATICACIDVEN in the last 168 hours.  Recent Results (from the past 240 hour(s))  Culture, blood (routine x 2)     Status: None (Preliminary result)   Collection Time: 02/26/21  7:00 PM   Specimen: BLOOD RIGHT ARM  Result Value Ref Range Status   Specimen Description BLOOD RIGHT ARM  Final   Special Requests   Final    BOTTLES DRAWN AEROBIC AND ANAEROBIC Blood Culture adequate volume   Culture   Final    NO GROWTH < 12 HOURS Performed at [facility], [address], [place], [place] [address]    Report Status PENDING  Incomplete  Culture, blood (routine x 2)     Status: None (Preliminary result)   Collection Time: 02/26/21  7:15 PM   Specimen: BLOOD LEFT ARM  Result Value Ref Range Status   Specimen Description BLOOD LEFT ARM  Final   Special Requests   Final    BOTTLES DRAWN AEROBIC ONLY Blood Culture results may not be optimal due to an excessive volume of blood received in culture bottles   Culture   Final    NO GROWTH < 12 HOURS Performed at [facility], [place] [address], [place], [place] [address]    Report Status PENDING  Incomplete  Resp Panel by RT-PCR (Flu A&B, Covid) Nasopharyngeal Swab     Status: None    Collection Time: 02/27/21  3:22 AM   Specimen: Nasopharyngeal Swab; Nasopharyngeal(NP) swabs in vial transport medium  Result Value Ref Range Status   SARS Coronavirus 2 by RT PCR NEGATIVE NEGATIVE Final    Comment: (NOTE) SARS-CoV-2 target nucleic acids are NOT DETECTED.  The SARS-CoV-2 RNA is generally detectable in upper respiratory specimens during the acute phase of infection. The lowest concentration of SARS-CoV-2 viral copies this assay can detect is 138 copies/mL. A negative result does not preclude SARS-Cov-2 infection and should not be used as the sole basis for treatment or other patient management decisions. A negative result may occur with  improper specimen collection/handling, submission of specimen other than nasopharyngeal swab, presence of viral mutation(s) within the areas targeted by this assay, and inadequate number of viral copies(<138 copies/mL). A negative result must be combined with clinical observations, patient history, and epidemiological information. The expected result is Negative.  Fact Sheet for Patients:  EntrepreneurPulse.com.au  Fact Sheet for Healthcare Providers:  IncredibleEmployment.be  This test is no t yet approved or cleared by the [place] FDA and  has been authorized for detection and/or diagnosis of SARS-CoV-2 by FDA under an Emergency Use Authorization (EUA). This EUA will remain  in effect (meaning this test can be used) for the duration of the COVID-19 declaration under Section 564(b)(1) of the Act, 21 U.S.C.section 360bbb-3(b)(1), unless the authorization is terminated  or revoked sooner.       Influenza A by PCR NEGATIVE NEGATIVE Final   Influenza B by PCR NEGATIVE NEGATIVE Final    Comment: (NOTE) The Xpert Xpress SARS-CoV-2/FLU/RSV plus assay is intended as an aid in the diagnosis of influenza from Nasopharyngeal swab specimens and should not be used as a sole basis for treatment.  Nasal washings and aspirates are unacceptable for Xpert Xpress SARS-CoV-2/FLU/RSV testing.  Fact Sheet for Patients: EntrepreneurPulse.com.au  Fact Sheet for Healthcare Providers: IncredibleEmployment.be  This test is not yet approved or cleared by the [place] and  has been authorized for detection and/or diagnosis of SARS-CoV-2 by FDA under an Emergency Use Authorization (EUA). This EUA will remain in effect (meaning this test can be used) for the duration of the COVID-19 declaration under Section 564(b)(1) of the Act, 21 U.S.C. section 360bbb-3(b)(1), unless the authorization is terminated or revoked.  Performed at [facility], [place] [address] [place], [place] [address]      Radiology Studies: CT FOOT LEFT WO CONTRAST  Result Date: 02/27/2021 CLINICAL DATA:  Osteomyelitis of foot. Recent left foot surgery 4 weeks ago. EXAM: CT OF THE LEFT FOOT WITHOUT CONTRAST TECHNIQUE: Multidetector CT imaging of the left foot was performed according to the standard protocol. Multiplanar CT image reconstructions were also generated. COMPARISON:  X-ray left foot 02/26/2021 FINDINGS: Bones/Joint/Cartilage Partially amputation of the first digit proximal phalanx. Indication of second digit phalanges. Amputation of the proximal phalanx and partial metatarsal of the fifth digit. Cortical irregularity with several fracture fragments along the first digit and fifth digit amputations with no definite cortical erosion or destruction. No acute displaced fracture or dislocation. Ligaments Suboptimally assessed by CT. Muscles and Tendons Atrophic. Soft tissues Subcutaneus soft tissue edema as well as couple foci of soft tissue emphysema along the first digit metatarsal head. Edema fifth digit. Soft tissue defect of the forefoot. IMPRESSION: Postsurgical forefoot surgical changes with several fracture fragments along the first digit and fifth digit amputations with  no definite cortical erosion or destruction. Associated subcutaneus soft tissue edema and emphysema in a patient with a soft tissue defect. Recommend MRI (with intravenous contrast if GFR greater than 30) for further evaluation of possible osteomyelitis. Electronically Signed   By: Iven Finn M.D.   On: 02/27/2021 04:38   DG Foot Complete Left  Result Date: 02/26/2021 CLINICAL DATA:  65 year old female with concern for infection EXAM: LEFT FOOT - COMPLETE 3+ VIEW COMPARISON:  Left foot radiograph dated 02/03/2021. FINDINGS: Status post prior amputation of proximal half of the proximal phalanx of the great toe and distal half of the fifth metatarsal as well as amputation of the phalanges of the second toe. There is slight fragmentation of the amputated edge of the proximal phalanx of the great toe. No acute fracture or dislocation. The bones are osteopenic. There is soft tissue swelling of the forefoot. No radiopaque foreign object or soft tissue gas. IMPRESSION: 1. Postsurgical changes of the forefoot as seen on the prior radiograph. No acute fracture or dislocation. MRI or a white blood cell nuclear scan may provide better evaluation if there is high clinical concern for acute osteomyelitis. 2. Diffuse soft tissue swelling of the forefoot. No soft tissue gas. Electronically Signed   By: Anner Crete M.D.   On: 02/26/2021 20:12    Scheduled Meds: . amiodarone  200 mg Oral Daily  . atorvastatin  10 mg Oral Daily  . calcitRIOL  0.5 mcg Oral q morning  . carvedilol  3.125 mg Oral q morning  . DULoxetine  30 mg Oral QHS  . ferrous sulfate  325 mg Oral q morning  . gabapentin  600 mg Oral QHS  . insulin aspart  0-9 Units Subcutaneous Q4H  . insulin glargine  22 Units Subcutaneous Q2200  . isosorbide mononitrate  30 mg Oral Daily  . levothyroxine  50 mcg Oral Q0600   Continuous Infusions: . ceFEPime (MAXIPIME) IV Stopped (02/27/21 1245)  . [START ON 03/01/2021] vancomycin       LOS: 0 days    Marylu Lund, MD [facility] Pager On Amion  If 7PM-7AM, please contact night-coverage 02/27/2021, 3:27 PM

## 2021-02-27 NOTE — Progress Notes (Signed)
Foam dressing applied after area cleansed with N/S.  Surface area friable and covered with foam to protect shearing.

## 2021-02-27 NOTE — H&P (Signed)
History and Physical    [name]:[id number] DOB: [birth year] DOA: 02/26/2021  PCP: [name], MD   Patient coming from: Home.  Chief Complaint: Left foot discharge and pain.  HPI: [name] is a 65 y.o. female with history of nonischemic cardiomyopathy status post AICD, atrial fibrillation, diabetes mellitus, sleep apnea, hypothyroidism who was admitted last month for left foot osteomyelitis status post bone resection of the fifth proximal phalanx and metatarsal and sesamoidectomy also had vascular procedure was eventually discharged home on Feb 06, 2021 about 3 weeks ago and antibiotics till patient is on antibiotics started noticing increasing discharge that has bloody since yesterday morning.  Has subjective feeling of fever chills.  Increasing pain.  The discharge mostly on the plantar aspect of the first metatarsal head.  Patient had gone to her podiatrist office and was referred to the ER.  ED Course: In the ER patient was afebrile x-ray shows soft tissue swelling concerning for cellulitis.  Labs are largely at baseline with anemia and chronic kidney disease stage IV and COVID test are pending.  Patient started on empiric antibiotics admitted for further management for cellulitis with possible osteomyelitis.  Blood cultures been sent.  Review of Systems: As per HPI, rest all negative.   Past Medical History:  Diagnosis Date  . Acute myocardial infarction, subendocardial infarction, initial episode of care ([place]) 07/21/2012  . Acute osteomyelitis involving ankle and foot ([place]) 02/06/2015  . Acute systolic heart failure ([place]) 07/21/2012   New onset 07/19/12; admission to [facility] ED. Elevated Troponins.  S/p 2D-echo with EF 20-25%.  S/p cardiac catheterization with stenting LAD.  Repeat 2D-echo 10/2011 with improved EF of 35%.   . Anemia   . Automatic implantable cardioverter-defibrillator in situ    a. MDT CRT-D 06/2014, SN: [id number]  .removed in feb 2020  .  CAD (coronary artery disease)    a. cardiac cath 101/04/2012: PCI/DES to chronically occluded mLAD, consideration PCI to diag branch in 4 weeks.   . Cataract   . Chicken pox   . Chronic kidney disease   . Chronic systolic CHF (congestive heart failure) (HCC)    a. mixed ICM & NICM; b. EF 20-25% by echo 07/2012, mid-dist 2/3 of LV sev HK/AK, mild MR. echo 10/2012: EF 30-35%, sev HK ant-septal & inf walls, GR1DD, mild MR, PASP 33. c. echo 02/2013: EF 30%, GR1DD, mild MR. echo 04/2014: EF 30%, Septal-lat dyssynchrony, global HK, inf AK, GR1DD, mild MR. d. echo 10/2014: EF50-55%, WM nl, GR1DD, septal mild paradox. e. echo 02/2015: EF 50-55%, wm   . Depression   . Heart attack ([place])   . Heel ulcer ([place]) 04/27/2015  . History of blood transfusion ~ 2011   "plasma; had neuropathy; couldn't walk"  . Hypertension   . LBBB (left bundle branch block)   . Neuromuscular disorder ([place])   . Neuropathy 2011  . Obesity, unspecified   . OSA on CPAP    Moderate with AHI 23/hr and now on CPAP at 16cm H2O.  Her DME is [facility]  . Pure hypercholesterolemia   . Sepsis ([place]) 09/2018  . Type II diabetes mellitus ([place])   . Unspecified vitamin D deficiency     Past Surgical History:  Procedure Laterality Date  . ABDOMINAL AORTOGRAM W/LOWER EXTREMITY N/A 02/02/2021   Procedure: ABDOMINAL AORTOGRAM W/LOWER EXTREMITY;  Surgeon: Angelia Mould, MD;  Location: [place] CV LAB;  Service: Cardiovascular;  Laterality: N/A;  . AMPUTATION TOE Right 06/18/2015  Procedure: AMPUTATION TOE;  Surgeon: Samara Deist, DPM;  Location: [facility] ORS;  Service: Podiatry;  Laterality: Right;  . AMPUTATION TOE Left 10/08/2018   Procedure: AMPUTATION TOE LEFT 2ND;  Surgeon: Albertine Patricia, DPM;  Location: [facility] ORS;  Service: Podiatry;  Laterality: Left;  . BACK SURGERY    . BI-VENTRICULAR IMPLANTABLE CARDIOVERTER DEFIBRILLATOR N/A 07/06/2014   Procedure: BI-VENTRICULAR IMPLANTABLE CARDIOVERTER DEFIBRILLATOR  (CRT-D);  Surgeon: Deboraha Sprang, MD;  Location: [facility] CATH LAB;  Service: Cardiovascular;  Laterality: N/A;  . BI-VENTRICULAR IMPLANTABLE CARDIOVERTER DEFIBRILLATOR  (CRT-D)  07/06/2014  . BILATERAL OOPHORECTOMY  01/2011   ovarian cyst benign  . BIV ICD INSERTION CRT-D N/A 03/31/2019   Procedure: BIV ICD INSERTION CRT-D;  Surgeon: Constance Haw, MD;  Location: [place] CV LAB;  Service: Cardiovascular;  Laterality: N/A;  . COLONOSCOPY WITH PROPOFOL Left 02/22/2015   Procedure: COLONOSCOPY WITH PROPOFOL;  Surgeon: Hulen Luster, MD;  Location[facility] ENDOSCOPY;  Service: Endoscopy;  Laterality: Left;  . CORONARY ANGIOPLASTY WITH STENT PLACEMENT Left 07/2012   new onset systolic CHF; elevated troponins.  Cardiac catheterization with stenting to LAD; EF 15%.  2D-echo: EF 20-25%.  . ESOPHAGOGASTRODUODENOSCOPY N/A 02/22/2015   Procedure: ESOPHAGOGASTRODUODENOSCOPY (EGD);  Surgeon: Hulen Luster, MD;  Location: [facility] ENDOSCOPY;  Service: Endoscopy;  Laterality: N/A;  . I & D EXTREMITY Left 11/28/2020   Procedure: IRRIGATION AND DEBRIDEMENT OF FOOT;  Surgeon: Trula Slade, DPM;  Location: [place];  Service: Podiatry;  Laterality: Left;  . I & D EXTREMITY Left 01/31/2021   Procedure: IRRIGATION AND DEBRIDEMENT EXTREMITY OF LEFT FOOT WITH BONE BIOPSY OF  1ST METATARSAL AND FIFTH METATARSAL;  Surgeon: Felipa Furnace, DPM;  Location: [place];  Service: Podiatry;  Laterality: Left;  . I & D EXTREMITY Left 02/03/2021   Procedure: IRRIGATION AND DEBRIDEMENT EXTREMITY WITH INTERNAL AMPUTATION FIRST AND FIFTH RAY;  Surgeon: Felipa Furnace, DPM;  Location: [place];  Service: Podiatry;  Laterality: Left;  . INCISION AND DRAINAGE ABSCESS Right 2007   groin; with ICU stay due to sepsis.  Marland Kitchen LAPAROSCOPIC CHOLECYSTECTOMY  2011  . LEFT HEART CATH AND CORONARY ANGIOGRAPHY N/A 10/05/2018   Procedure: LEFT HEART CATH AND CORONARY ANGIOGRAPHY;  Surgeon: Minna Merritts, MD;  Location: [place] CV LAB;  Service: Cardiovascular;  Laterality: N/A;  .  LEFT HEART CATHETERIZATION WITH CORONARY ANGIOGRAM N/A 07/21/2012   Procedure: LEFT HEART CATHETERIZATION WITH CORONARY ANGIOGRAM;  Surgeon: Jolaine Artist, MD;  Location: [facility] CATH LAB;  Service: Cardiovascular;  Laterality: N/A;  . [place]   L4-5  . PACEMAKER REMOVAL  11/2018   due to infection around pacemaker  . PERCUTANEOUS CORONARY STENT INTERVENTION (PCI-S) N/A 07/23/2012   Procedure: PERCUTANEOUS CORONARY STENT INTERVENTION (PCI-S);  Surgeon: Sherren Mocha, MD;  Location: [facility] CATH LAB;  Service: Cardiovascular;  Laterality: N/A;  . PERIPHERAL VASCULAR BALLOON ANGIOPLASTY Left 10/06/2018   Procedure: PERIPHERAL VASCULAR BALLOON ANGIOPLASTY;  Surgeon: Katha Cabal, MD;  Location: [place] CV LAB;  Service: Cardiovascular;  Laterality: Left;  . PERIPHERAL VASCULAR CATHETERIZATION N/A 02/10/2015   Procedure: Picc Line Insertion;  Surgeon: Katha Cabal, MD;  Location: [place] CV LAB;  Service: Cardiovascular;  Laterality: N/A;  . RIGHT HEART CATH N/A 03/29/2019   Procedure: RIGHT HEART CATH;  Surgeon: Jolaine Artist, MD;  Location: [place] CV LAB;  Service: Cardiovascular;  Laterality: N/A;  . TEE WITHOUT CARDIOVERSION N/A 10/02/2018   Procedure: TRANSESOPHAGEAL ECHOCARDIOGRAM (  TEE);  Surgeon: Wellington Hampshire, MD;  Location: [facility] ORS;  Service: Cardiovascular;  Laterality: N/A;  . TUBAL LIGATION  1981  . VAGINAL HYSTERECTOMY  01/2011   Fibroids/DUB.  Ovaries removed. [place].     reports that she has never smoked. She has never used smokeless tobacco. She reports that she does not drink alcohol and does not use drugs.  Allergies  Allergen Reactions  . Nsaids     Kidney disease    Family History  Problem Relation Age of Onset  . Diabetes Mother   . Hypertension Mother   . Arthritis Mother        knees, lumbar DDD, cervical DDD  . Cancer Father        prostate,skin,lymphoma.  . Cancer Brother 31       bladder cancer; non-smoker   . Diabetes Maternal Grandmother   . Heart disease Maternal Grandmother   . COPD Maternal Grandmother   . Diabetes Paternal Grandmother   . Obesity Brother   . Diabetes Son   . Hypertension Son   . Cancer Maternal Grandfather   . Diabetes Paternal Grandfather     Prior to Admission medications   Medication Sig Start Date End Date Taking? Authorizing Provider  amiodarone (PACERONE) 200 MG tablet Take 1 tablet (200 mg total) by mouth daily. 10/30/20   Bensimhon, Shaune Pascal, MD  amoxicillin-clavulanate (AUGMENTIN) 500-125 MG tablet Take 1 tablet (500 mg total) by mouth every 12 (twelve) hours. 02/06/21   Nita Sells, MD  amoxicillin-clavulanate (AUGMENTIN) 875-125 MG tablet Take 1 tablet by mouth 2 (two) times daily. 02/16/21   Felipa Furnace, DPM  aspirin EC 81 MG EC tablet Take 1 tablet (81 mg total) by mouth daily. Swallow whole. 02/07/21   Nita Sells, MD  atorvastatin (LIPITOR) 10 MG tablet Take 1 tablet (10 mg total) by mouth daily. 02/07/21   Nita Sells, MD  calcitRIOL (ROCALTROL) 0.5 MCG capsule Take 0.5 mcg by mouth every morning. 12/25/20   [provider]  Calcium Carb-Cholecalciferol (CALCIUM CARBONATE-VITAMIN D3) 600-400 MG-UNIT TABS Take 1 tablet by mouth 2 (two) times a day.    [provider]  carvedilol (COREG) 3.125 MG tablet Take 3.125 mg by mouth every morning.    [provider]  clopidogrel (PLAVIX) 75 MG tablet TAKE 1 TABLET BY MOUTH ONCE DAILY 11/10/20   Minna Merritts, MD  DULoxetine (CYMBALTA) 30 MG capsule Take 1 capsule (30 mg total) by mouth at bedtime. 01/26/21   Narda Amber K, DO  ferrous sulfate 325 (65 FE) MG tablet Take 325 mg by mouth every morning.    [provider]  gabapentin (NEURONTIN) 600 MG tablet Take 1 tablet (600 mg total) by mouth at bedtime. 02/15/21   [name], MD  insulin aspart (NOVOLOG FLEXPEN) 100 UNIT/ML FlexPen Inject 5-10 Units into the skin 3 (three) times daily before meals.  Sugar 100-149 - 5 units.  150-199- 6 units.  200-249- 7 units.  250-299- 8 units. 300-349- 9 units.  350+ 10 units. 02/15/21   [name], MD  Insulin Pen Needle (PEN NEEDLES) 32G X 6 MM MISC 1 each by Does not apply route 4 (four) times daily. 09/06/19   Daleen Squibb, MD  isosorbide mononitrate (IMDUR) 30 MG 24 hr tablet Take 1 tablet (30 mg total) by mouth daily. Needs appt 05/16/20   Bensimhon, Shaune Pascal, MD  LANTUS SOLOSTAR 100 UNIT/ML Solostar Pen INJECT 22 UNITS UNDER THE SKIN DAILY. 10/02/20  [name], MD  levothyroxine (SYNTHROID) 50 MCG tablet TAKE 1 TABLET BY MOUTH ONCE DAILY BEFOREBREAKFAST 10/30/20   [name], MD  Multiple Vitamin (MULTIVITAMIN WITH MINERALS) TABS tablet Take 1 tablet by mouth every morning.    [provider]  nitroGLYCERIN (NITROSTAT) 0.4 MG SL tablet Place 1 tablet (0.4 mg total) under the tongue every 5 (five) minutes as needed for chest pain. 03/28/20 06/26/20  Deboraha Sprang, MD  Omega-3 Fatty Acids (FISH OIL) 1000 MG CAPS Take 1,000 mg by mouth 2 (two) times a day.    [provider]  torsemide (DEMADEX) 20 MG tablet Take 1 tablet (20 mg total) by mouth daily as needed (For fluid). 02/18/21   [name], MD  traMADol (ULTRAM) 50 MG tablet Take 1-2 tablets (50-100 mg total) by mouth at bedtime as needed. 02/15/21   [name], MD    Physical Exam: Constitutional: Moderately built and nourished. Vitals:   02/26/21 2033 02/26/21 2340 02/27/21 0000 02/27/21 0100  BP: 118/71 138/67 123/65 119/71  Pulse: 68 72 63 63  Resp: 20 18 17 19   Temp: 98.1 F (36.7 C)     TempSrc: Oral     SpO2: 100% 97% 96% 98%  Weight:    130 kg  Height:    5\' 8"  (1.727 m)   Eyes: Anicteric no pallor. ENMT: No discharge from the ears eyes nose and mouth. Neck: No mass felt.  No neck rigidity. Respiratory: No rhonchi or crepitations. Cardiovascular: S1-S2 heard. Abdomen: Soft nontender bowel sound present. Musculoskeletal:  Swelling of the left foot mostly on the right great toe area with some discoloration around the first metatarsal head.  Sutures seen. Skin: Erythema and swelling of the right foot great toe area with some erythema. Neurologic: Alert awake oriented to time place and person.  Moves all extremities. Psychiatric: Appears normal.  Normal affect.   Labs on Admission: I have personally reviewed following labs and imaging studies  CBC: Recent Labs  Lab 02/26/21 2000  WBC 7.3  NEUTROABS 4.5  HGB 11.5*  HCT 36.3  MCV 96.3  PLT 119   Basic Metabolic Panel: Recent Labs  Lab 02/26/21 2000  NA 138  K 4.8  CL 103  CO2 25  GLUCOSE 154*  BUN 31*  CREATININE 1.80*  CALCIUM 9.6   GFR: Estimated Creatinine Clearance: 43.2 mL/min (A) (by C-G formula based on SCr of 1.8 mg/dL (H)). Liver Function Tests: Recent Labs  Lab 02/26/21 2000  AST 17  ALT 15  ALKPHOS 64  BILITOT 0.7  PROT 7.2  ALBUMIN 3.5   No results for input(s): LIPASE, AMYLASE in the last 168 hours. No results for input(s): AMMONIA in the last 168 hours. Coagulation Profile: No results for input(s): INR, PROTIME in the last 168 hours. Cardiac Enzymes: No results for input(s): CKTOTAL, CKMB, CKMBINDEX, TROPONINI in the last 168 hours. BNP (last 3 results) No results for input(s): PROBNP in the last 8760 hours. HbA1C: No results for input(s): HGBA1C in the last 72 hours. CBG: No results for input(s): GLUCAP in the last 168 hours. Lipid Profile: No results for input(s): CHOL, HDL, LDLCALC, TRIG, CHOLHDL, LDLDIRECT in the last 72 hours. Thyroid Function Tests: No results for input(s): TSH, T4TOTAL, FREET4, T3FREE, THYROIDAB in the last 72 hours. Anemia Panel: No results for input(s): VITAMINB12, FOLATE, FERRITIN, TIBC, IRON, RETICCTPCT in the last 72 hours. Urine analysis:    Component Value Date/Time   COLORURINE AMBER (A) 03/09/2019 2054  APPEARANCEUR TURBID (A) 03/09/2019 2054   LABSPEC 1.017 03/09/2019 2054    PHURINE 5.0 03/09/2019 2054   GLUCOSEU NEGATIVE 03/09/2019 2054   HGBUR LARGE (A) 03/09/2019 2054   BILIRUBINUR NEGATIVE 03/09/2019 2054   BILIRUBINUR negative 09/27/2017 1506   BILIRUBINUR neg 01/16/2015 1533   [place] 03/09/2019 2054   PROTEINUR 100 (A) 03/09/2019 2054   UROBILINOGEN 0.2 09/27/2017 1506   UROBILINOGEN 1.0 05/17/2010 0214   NITRITE NEGATIVE 03/09/2019 2054   LEUKOCYTESUR MODERATE (A) 03/09/2019 2054   Sepsis Labs: @LABRCNTIP (procalcitonin:4,lacticidven:4) )No results found for this or any previous visit (from the past 240 hour(s)).   Radiological Exams on Admission: DG Foot Complete Left  Result Date: 02/26/2021 CLINICAL DATA:  65 year old female with concern for infection EXAM: LEFT FOOT - COMPLETE 3+ VIEW COMPARISON:  Left foot radiograph dated 02/03/2021. FINDINGS: Status post prior amputation of proximal half of the proximal phalanx of the great toe and distal half of the fifth metatarsal as well as amputation of the phalanges of the second toe. There is slight fragmentation of the amputated edge of the proximal phalanx of the great toe. No acute fracture or dislocation. The bones are osteopenic. There is soft tissue swelling of the forefoot. No radiopaque foreign object or soft tissue gas. IMPRESSION: 1. Postsurgical changes of the forefoot as seen on the prior radiograph. No acute fracture or dislocation. MRI or a white blood cell nuclear scan may provide better evaluation if there is high clinical concern for acute osteomyelitis. 2. Diffuse soft tissue swelling of the forefoot. No soft tissue gas. Electronically Signed   By: Anner Crete M.D.   On: 02/26/2021 20:12     Assessment/Plan Principal Problem:   Cellulitis of left foot Active Problems:   Essential hypertension, benign   OSA (obstructive sleep apnea)   History of CVA (cerebrovascular accident)   Paroxysmal atrial fibrillation ([place])   DM type 2, uncontrolled, with renal complications  (HCC)   CKD (chronic kidney disease) stage 4, GFR 15-29 ml/min (HCC)   Osteomyelitis (HCC)   Cellulitis    1. Cellulitis of the left foot with concerning features for possible osteomyelitis mainly around the first metatarsal head -blood cultures were sent and patient was started on empiric antibiotics.  CT of the left foot has been ordered.  Patient's.  Is aware. 2. Peripheral artery disease status post recent angioplasty. 3. Diabetes mellitus type 2 on Lantus insulin usually takes at night 22 units.  With sliding scale coverage.  Since patient is kept n.p.o. I have kept sliding scale every 4.  Dose of Lantus may need to be adjusted if patient remains n.p.o. or based on patient's diet. 4. Nonischemic cardiomyopathy since patient is probably going for surgery we will hold torsemide for now but appears compensated. 5. Paroxysmal atrial fibrillation on amiodarone which will be continued.  Not on anticoagulation. 6. Chronic kidney disease stage IV creatinine appears to be at baseline when compared to recent past. 7. Anemia of chronic disease and renal disease follow CBC. 8. History of sleep apnea on CPAP. 9. Hypothyroidism on Synthroid.   COVID test is pending.  Since patient has cellulitis with possible osteomyelitis will need close monitoring for any further worsening in inpatient status.   DVT prophylaxis: SCDs.  Avoiding anticoagulation anticipation of possible need for surgery. Code Status: Full code. Family Communication: Discussed with patient. Disposition Plan: Home. Consults called: We will need to notify podiatrist in the morning. Admission status: Inpatient.   Rise Patience MD [facility]  Pager [phone].  If 7PM-7AM, please contact night-coverage [email or web address] Password [facility]  02/27/2021, 3:32 AM

## 2021-02-28 ENCOUNTER — Encounter (HOSPITAL_COMMUNITY): Payer: HMO

## 2021-02-28 DIAGNOSIS — E1129 Type 2 diabetes mellitus with other diabetic kidney complication: Secondary | ICD-10-CM | POA: Diagnosis not present

## 2021-02-28 DIAGNOSIS — L03116 Cellulitis of left lower limb: Secondary | ICD-10-CM | POA: Diagnosis not present

## 2021-02-28 DIAGNOSIS — N184 Chronic kidney disease, stage 4 (severe): Secondary | ICD-10-CM | POA: Diagnosis not present

## 2021-02-28 DIAGNOSIS — I10 Essential (primary) hypertension: Secondary | ICD-10-CM | POA: Diagnosis not present

## 2021-02-28 DIAGNOSIS — M86172 Other acute osteomyelitis, left ankle and foot: Secondary | ICD-10-CM | POA: Diagnosis not present

## 2021-02-28 LAB — GLUCOSE, CAPILLARY
Glucose-Capillary: 115 mg/dL — ABNORMAL HIGH (ref 70–99)
Glucose-Capillary: 130 mg/dL — ABNORMAL HIGH (ref 70–99)
Glucose-Capillary: 152 mg/dL — ABNORMAL HIGH (ref 70–99)
Glucose-Capillary: 179 mg/dL — ABNORMAL HIGH (ref 70–99)
Glucose-Capillary: 194 mg/dL — ABNORMAL HIGH (ref 70–99)
Glucose-Capillary: 245 mg/dL — ABNORMAL HIGH (ref 70–99)

## 2021-02-28 NOTE — TOC Initial Note (Signed)
Transition of Care [facility]) - Initial/Assessment Note    Patient Details  Name: [name] MRN: [id number] Date of Birth: 09-28-1956  Transition of Care [facility]) CM/SW Contact:    Marilu Favre, RN Phone Number: 02/28/2021, 11:30 AM  Clinical Narrative:                 Spoke to patient at bedside. Patient recent discharge from hospital with  [facility] through [place]. Patient would like to continue services through [place].  Cory with [facility] aware of patient's admission and is following. Will need resumption of care orders, prior to discahrge.  Patient has a walker and wheel chair at home already.   Patient staying with her mother at : [address], [place], [place] [address] phone [phone]  PCP is Dr Elsie Stain   NCM will continue to follow for discharge needs  Expected Discharge Plan: [place] Barriers to Discharge: Continued Medical Work up   Patient Goals and CMS Choice Patient states their goals for this hospitalization and ongoing recovery are:: to return to mother's home CMS Medicare.gov Compare Post Acute Care list provided to:: Patient Choice offered to / list presented to : Patient  Expected Discharge Plan and Services Expected Discharge Plan: [place] Choice: [place] arrangements for the past 2 months: Single Family Home                           HH Arranged: RN          Prior Living Arrangements/Services Living arrangements for the past 2 months: Single Family Home Lives with:: Parents Patient language and need for interpreter reviewed:: Yes Do you feel safe going back to the place where you live?: Yes      Need for Family Participation in Patient Care: Yes (Comment) Care giver support system in place?: Yes (comment) Current home services: DME Criminal Activity/Legal Involvement Pertinent to Current Situation/Hospitalization: No - Comment as needed  Activities of Daily  Living      Permission Sought/Granted   Permission granted to share information with : No              Emotional Assessment Appearance:: Appears stated age Attitude/Demeanor/Rapport: Engaged Affect (typically observed): Accepting Orientation: : Oriented to Self,Oriented to Place,Oriented to  Time,Oriented to Situation Alcohol / Substance Use: Not Applicable Psych Involvement: No (comment)  Admission diagnosis:  Cellulitis [L03.90] Osteomyelitis ([place]) [M86.9] Toe infection [L08.9] Patient Active Problem List   Diagnosis Date Noted  . Cellulitis of left foot 02/27/2021  . Cellulitis 02/27/2021  . Osteomyelitis ([place]) 02/18/2021  . CKD (chronic kidney disease) stage 4, GFR 15-29 ml/min (HCC) 01/28/2021  . AICD (automatic cardioverter/defibrillator) present 01/28/2021  . Diabetic foot ulcer ([place]) 11/24/2020  . Advance care planning 10/04/2020  . Secondary hyperparathyroidism of renal origin ([place]) 07/20/2019  . Hyperkalemia 07/20/2019  . Benign hypertensive kidney disease with chronic kidney disease 07/20/2019  . Anemia in chronic kidney disease 07/20/2019  . Positive antinuclear antibody 07/20/2019  . Proteinuria 07/20/2019  . Acute on chronic systolic (congestive) heart failure ([place]) 03/23/2019  . UTI (urinary tract infection) 03/09/2019  . CAD (coronary artery disease) 03/09/2019  . DM type 2, uncontrolled, with renal complications ([place]) 24/06/7352  . Atrial tachycardia ([place])   . Acute kidney injury superimposed on chronic kidney disease ([place])   . AKI (acute kidney injury) ([place])   .  Weakness generalized 12/22/2018  . Shortness of breath 12/22/2018  . Palliative care encounter 12/22/2018  . Endocarditis 12/14/2018  . NSTEMI (non-ST elevated myocardial infarction) ([place]) 11/22/2018  . Acute on chronic combined systolic and diastolic CHF (congestive heart failure) ([place]) 10/28/2018  . Lymphedema 10/28/2018  . Severe sepsis ([place]) 09/28/2018  . History of amputation of  lesser toe of right foot ([place]) 04/19/2018  . Presence of cardiac resynchronization therapy defibrillator (CRT-D) 04/19/2018  . Paraparesis of both lower limbs ([place]) 03/29/2018  . 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 Paroxysmal atrial fibrillation ([place]) 03/29/2018  . Class 3 obesity due to excess calories with serious comorbidity and body mass index (BMI) of 40.0 to 44.9 in adult 09/02/2016  . History of CVA (cerebrovascular accident) 11/11/2015  . Chronic renal insufficiency 08/04/2015  . OSA (obstructive sleep apnea) 08/15/2014  . Morbid obesity ([place]) 10/26/2013  . Essential hypertension, benign 04/29/2013  . Pure hypercholesterolemia 12/07/2012  . Iron deficiency anemia 12/07/2012  . Diabetic peripheral neuropathy associated with type 2 diabetes mellitus ([place]) 12/07/2012  . Chronic systolic congestive heart failure ([place]) 07/29/2012  . Left bundle-branch block 07/25/2012  . Coronary artery disease involving native coronary artery of native heart without angina pectoris 07/25/2012   PCP:  [name], MD Pharmacy:   [facility] [place], [place] [address] Phone: [phone] Fax: [phone] - [place], [place] [address] [place] [address] Phone: [phone] Fax: [phone]     Social Determinants of Health (SDOH) Interventions    Readmission Risk Interventions Readmission Risk Prevention Plan 02/06/2021 04/02/2019 02/18/2019  Transportation Screening Complete Complete Complete  Medication Review (RN Care Manager) Complete Complete Complete  PCP or Specialist appointment within 3-5 days of discharge Complete Complete Complete  HRI or Home Care Consult Complete Complete Complete  SW Recovery Care/Counseling Consult Complete Patient refused Patient refused  Palliative Care Screening Not Applicable Not Applicable Not [place] Not Applicable Not Applicable Not Applicable  Some recent data might be hidden

## 2021-02-28 NOTE — Progress Notes (Signed)
Dr Jacqualyn Posey at bedside. Md discussed and updated pt on recommended plan of care. Dressing change left foot done by md while this rn at bedside. Md reports yellow drng noted from bottom side of foot in area of toe. Md applied dry gauze then kerlix and ace wrap.

## 2021-02-28 NOTE — Progress Notes (Signed)
PROGRESS NOTE  [name] NFA:[id number] DOB: [birth year] DOA: 02/26/2021 PCP: [name], MD   LOS: 1 day   Brief Narrative / Interim history: 65 year old female with nonischemic cardiomyopathy/AICD in place, A. fib, DM 2, OSA, hypothyroidism, chronic kidney disease stage IV who recently had left foot osteomyelitis and was admitted last month status post bone resection of the fifth proximal phalanx and metatarsal and sesamoidectomy was discharged home on May 3.  At that time vascular was also consulted and she was status post angioplasty of 3 separate in-stent stenosis in the left superficial femoral artery.  Intraoperative cultures grew Enterococcus and staph aureus and capitis, all pansensitive, per ID she was narrowed to Augmentin.  She has had worsening discharge from her foot and came back to the hospital from her podiatrist office.  Podiatry consulted  Subjective / 24h Interval events: She is doing well this morning, denies any fever or chills.  She denies  Assessment & Plan: Principal Problem Left foot cellulitis, concerning for osteomyelitis-patient was started on empiric antibiotics with vancomycin and cefepime, cultures were sent.  Podiatry consulted, appreciate input  Active Problems Peripheral arterial disease-she just recently had angioplasty few weeks ago.  Will repeat an ABI  DM2-poorly controlled, with hyperglycemia most recent A1c 8.1.  Continue Lantus, sliding scale.  Continue gabapentin for neuropathy  Paroxysmal A. fib, NSVT-currently on amiodarone, dual antiplatelet therapy which is now on hold.  Not sure the context of her prior A. fib episode but currently not on anticoagulation  Chronic kidney disease, stage IV-Baseline creatinine 2.0-2.5, currently at baseline  Coronary artery disease -no chest pain, continue statin, Coreg, Imdur  Chronic systolic CHF, mixed ischemic/nonischemic cardiomyopathy with left bundle branch block -in 2020 her EF was 10-15%,  now has a CRT and most recent 2D echo December 2021 showed EF 50-55%  Hypothyroidism-continue Synthroid  OSA-continue CPAP  Anemia of chronic disease-hemoglobin 10.7, stable  Scheduled Meds: . amiodarone  200 mg Oral Daily  . atorvastatin  10 mg Oral Daily  . calcitRIOL  0.5 mcg Oral q morning  . carvedilol  3.125 mg Oral q morning  . DULoxetine  30 mg Oral QHS  . ferrous sulfate  325 mg Oral q morning  . gabapentin  600 mg Oral QHS  . insulin aspart  0-9 Units Subcutaneous Q4H  . insulin glargine  22 Units Subcutaneous Q2200  . isosorbide mononitrate  30 mg Oral Daily  . levothyroxine  50 mcg Oral Q0600   Continuous Infusions: . ceFEPime (MAXIPIME) IV 2 g (02/28/21 0431)  . [START ON 03/01/2021] vancomycin     PRN Meds:.morphine injection, nitroGLYCERIN  Diet Orders (From admission, onward)    Start     Ordered   02/27/21 1554  Diet Carb Modified Fluid consistency: Thin; Room service appropriate? Yes  Diet effective now       Question Answer Comment  Diet-HS Snack? Nothing   Calorie Level Medium 1600-2000   Fluid consistency: Thin   Room service appropriate? Yes      02/27/21 1553          DVT prophylaxis: SCDs Start: 02/27/21 0331     Code Status: Full Code  Family Communication: No family present at bedside  Status is: Inpatient  Remains inpatient appropriate because:Inpatient level of care appropriate due to severity of illness   Dispo: The patient is from: Home              Anticipated d/c is to: Home  Patient currently is not medically stable to d/c.   Difficult to place patient No   Level of care: Telemetry Medical  Consultants:  Podiatry  Procedures:  None   Microbiology  Blood cultures 5/23-no growth  Antimicrobials: Vancomycin 5/23 >> Cefepime 5/23 >>   Objective: Vitals:   02/27/21 1729 02/27/21 2007 02/28/21 0000 02/28/21 0413  BP: (!) 157/72 (!) 141/51 (!) 151/55 (!) 140/53  Pulse: 65 64 61 (!) 57  Resp: 15 16 16  18   Temp: 98.4 F (36.9 C) 98.6 F (37 C) 98.6 F (37 C) 97.8 F (36.6 C)  TempSrc: Oral Oral Oral Oral  SpO2: 99% 98% 98% 98%  Weight: 130 kg     Height: 5\' 8"  (1.727 m)       Intake/Output Summary (Last 24 hours) at 02/28/2021 1133 Last data filed at 02/28/2021 0431 Gross per 24 hour  Intake 440 ml  Output 500 ml  Net -60 ml   Filed Weights   02/27/21 0100 02/27/21 1729  Weight: 130 kg 130 kg    Examination:  Constitutional: NAD Eyes: no scleral icterus ENMT: Mucous membranes are moist.  Neck: normal, supple Respiratory: clear to auscultation bilaterally, no wheezing, no crackles. Normal respiratory effort.  Cardiovascular: Regular rate and rhythm, no murmurs / rubs / gallops. No LE edema. Abdomen: non distended, no tenderness. Bowel sounds positive.  Musculoskeletal: no clubbing / cyanosis.  Skin: no rashes, left foot wrapped Neurologic: CN 2-12 grossly intact. Strength 5/5 in all 4.  Psychiatric: Normal judgment and insight. Alert and oriented x 3. Normal mood.    Data Reviewed: I have independently reviewed following labs and imaging studies   CBC: Recent Labs  Lab 02/26/21 2000 02/27/21 1917  WBC 7.3 5.5  NEUTROABS 4.5  --   HGB 11.5* 10.7*  HCT 36.3 33.7*  MCV 96.3 95.2  PLT 184 323*   Basic Metabolic Panel: Recent Labs  Lab 02/26/21 2000 02/27/21 1917  NA 138 139  K 4.8 4.9  CL 103 106  CO2 25 25  GLUCOSE 154* 190*  BUN 31* 29*  CREATININE 1.80* 1.82*  CALCIUM 9.6 8.8*   Liver Function Tests: Recent Labs  Lab 02/26/21 2000  AST 17  ALT 15  ALKPHOS 64  BILITOT 0.7  PROT 7.2  ALBUMIN 3.5   Coagulation Profile: No results for input(s): INR, PROTIME in the last 168 hours. HbA1C: No results for input(s): HGBA1C in the last 72 hours. CBG: Recent Labs  Lab 02/27/21 2013 02/28/21 0006 02/28/21 0410 02/28/21 0749 02/28/21 1109  GLUCAP 236* 152* 130* 115* 179*    Recent Results (from the past 240 hour(s))  Culture, blood  (routine x 2)     Status: None (Preliminary result)   Collection Time: 02/26/21  7:00 PM   Specimen: BLOOD RIGHT ARM  Result Value Ref Range Status   Specimen Description BLOOD RIGHT ARM  Final   Special Requests   Final    BOTTLES DRAWN AEROBIC AND ANAEROBIC Blood Culture adequate volume   Culture   Final    NO GROWTH 2 DAYS Performed at [facility], [place] [name], [place], [place] [address]    Report Status PENDING  Incomplete  Culture, blood (routine x 2)     Status: None (Preliminary result)   Collection Time: 02/26/21  7:15 PM   Specimen: BLOOD LEFT ARM  Result Value Ref Range Status   Specimen Description BLOOD LEFT ARM  Final   Special Requests   Final  BOTTLES DRAWN AEROBIC ONLY Blood Culture results may not be optimal due to an excessive volume of blood received in culture bottles   Culture   Final    NO GROWTH 2 DAYS Performed at [facility] [address], [place], [place] [address]    Report Status PENDING  Incomplete  Resp Panel by RT-PCR (Flu A&B, Covid) Nasopharyngeal Swab     Status: None   Collection Time: 02/27/21  3:22 AM   Specimen: Nasopharyngeal Swab; Nasopharyngeal(NP) swabs in vial transport medium  Result Value Ref Range Status   SARS Coronavirus 2 by RT PCR NEGATIVE NEGATIVE Final    Comment: (NOTE) SARS-CoV-2 target nucleic acids are NOT DETECTED.  The SARS-CoV-2 RNA is generally detectable in upper respiratory specimens during the acute phase of infection. The lowest concentration of SARS-CoV-2 viral copies this assay can detect is 138 copies/mL. A negative result does not preclude SARS-Cov-2 infection and should not be used as the sole basis for treatment or other patient management decisions. A negative result may occur with  improper specimen collection/handling, submission of specimen other than nasopharyngeal swab, presence of viral mutation(s) within the areas targeted by this assay, and inadequate number of  viral copies(<138 copies/mL). A negative result must be combined with clinical observations, patient history, and epidemiological information. The expected result is Negative.  Fact Sheet for Patients:  EntrepreneurPulse.com.au  Fact Sheet for Healthcare Providers:  IncredibleEmployment.be  This test is no t yet approved or cleared by the [place] FDA and  has been authorized for detection and/or diagnosis of SARS-CoV-2 by FDA under an Emergency Use Authorization (EUA). This EUA will remain  in effect (meaning this test can be used) for the duration of the COVID-19 declaration under Section 564(b)(1) of the Act, 21 U.S.C.section 360bbb-3(b)(1), unless the authorization is terminated  or revoked sooner.       Influenza A by PCR NEGATIVE NEGATIVE Final   Influenza B by PCR NEGATIVE NEGATIVE Final    Comment: (NOTE) The Xpert Xpress SARS-CoV-2/FLU/RSV plus assay is intended as an aid in the diagnosis of influenza from Nasopharyngeal swab specimens and should not be used as a sole basis for treatment. Nasal washings and aspirates are unacceptable for Xpert Xpress SARS-CoV-2/FLU/RSV testing.  Fact Sheet for Patients: EntrepreneurPulse.com.au  Fact Sheet for Healthcare Providers: IncredibleEmployment.be  This test is not yet approved or cleared by the [place] FDA and has been authorized for detection and/or diagnosis of SARS-CoV-2 by FDA under an Emergency Use Authorization (EUA). This EUA will remain in effect (meaning this test can be used) for the duration of the COVID-19 declaration under Section 564(b)(1) of the Act, 21 U.S.C. section 360bbb-3(b)(1), unless the authorization is terminated or revoked.  Performed at [facility], [place] [address] [place], [place] [address]      Radiology Studies: No results found.  Marzetta Board, MD, PhD [facility]  Between 7 am - 7 pm I  am available, please contact me via Amion (for emergencies) or Securechat (non urgent messages)  Between 7 pm - 7 am I am not available, please contact night coverage MD/APP via Amion

## 2021-02-28 NOTE — Progress Notes (Signed)
Subjective:  65 year old female presenting to the hospital with cellulitis, dehiscence of surgical wound.  She is started on antibiotics and overall she feels well today.  She has no new concerns.  Denies any fevers, chills, nausea, vomiting.  She has no pain although she does have significant neuropathy.  Objective: AAO x3, NAD DP/PT pulses palpable bilaterally, CRT less than 3 seconds Sutures intact to the fifth metatarsal without any surrounding edema, erythema or signs of infection.  However there is continued edema and erythema mostly to the hallux with localized edema spreading to the first MPJ.  There is no fluctuation crepitation.  There is no malodor.  Incision of the plantar first MPJ with dehiscence noted a small amount of purulence identified. No pain with calf compression, swelling, warmth, erythema  Assessment: Recurrence of infection left foot  Plan: Although the cellulitis is much improved today I discussed with her treatment options.  Discussed with conservative as well as surgical options.  This is the third time she has been admitted for the left foot particularly along infection of the big toe.  At this point I discussed with her amputation of the toe given the reoccurrence of infection and I am still concerned there is osteomyelitis present.  After discussion with her she agrees to this and wished to go ahead and proceed.  We will Plan on doing a partial first ray rotation on Friday.  For now continue IV antibiotics.  He is weightbearing as tolerated in surgical shoe.  Elevation of the foot.  Celesta Gentile, DPM

## 2021-03-01 ENCOUNTER — Telehealth: Payer: Self-pay

## 2021-03-01 ENCOUNTER — Encounter (HOSPITAL_COMMUNITY): Payer: HMO

## 2021-03-01 DIAGNOSIS — M86172 Other acute osteomyelitis, left ankle and foot: Secondary | ICD-10-CM | POA: Diagnosis not present

## 2021-03-01 DIAGNOSIS — E1129 Type 2 diabetes mellitus with other diabetic kidney complication: Secondary | ICD-10-CM | POA: Diagnosis not present

## 2021-03-01 DIAGNOSIS — I10 Essential (primary) hypertension: Secondary | ICD-10-CM | POA: Diagnosis not present

## 2021-03-01 DIAGNOSIS — L03116 Cellulitis of left lower limb: Secondary | ICD-10-CM | POA: Diagnosis not present

## 2021-03-01 DIAGNOSIS — N184 Chronic kidney disease, stage 4 (severe): Secondary | ICD-10-CM | POA: Diagnosis not present

## 2021-03-01 LAB — CBC
HCT: 32.5 % — ABNORMAL LOW (ref 36.0–46.0)
Hemoglobin: 10.7 g/dL — ABNORMAL LOW (ref 12.0–15.0)
MCH: 30.6 pg (ref 26.0–34.0)
MCHC: 32.9 g/dL (ref 30.0–36.0)
MCV: 92.9 fL (ref 80.0–100.0)
Platelets: 148 10*3/uL — ABNORMAL LOW (ref 150–400)
RBC: 3.5 MIL/uL — ABNORMAL LOW (ref 3.87–5.11)
RDW: 13.5 % (ref 11.5–15.5)
WBC: 5.7 10*3/uL (ref 4.0–10.5)
nRBC: 0 % (ref 0.0–0.2)

## 2021-03-01 LAB — BASIC METABOLIC PANEL
Anion gap: 8 (ref 5–15)
BUN: 27 mg/dL — ABNORMAL HIGH (ref 8–23)
CO2: 24 mmol/L (ref 22–32)
Calcium: 8.9 mg/dL (ref 8.9–10.3)
Chloride: 108 mmol/L (ref 98–111)
Creatinine, Ser: 1.49 mg/dL — ABNORMAL HIGH (ref 0.44–1.00)
GFR, Estimated: 38 mL/min — ABNORMAL LOW (ref >60)
Glucose, Bld: 119 mg/dL — ABNORMAL HIGH (ref 70–99)
Potassium: 3.8 mmol/L (ref 3.5–5.1)
Sodium: 140 mmol/L (ref 135–145)

## 2021-03-01 LAB — GLUCOSE, CAPILLARY
Glucose-Capillary: 121 mg/dL — ABNORMAL HIGH (ref 70–99)
Glucose-Capillary: 143 mg/dL — ABNORMAL HIGH (ref 70–99)
Glucose-Capillary: 156 mg/dL — ABNORMAL HIGH (ref 70–99)
Glucose-Capillary: 163 mg/dL — ABNORMAL HIGH (ref 70–99)
Glucose-Capillary: 172 mg/dL — ABNORMAL HIGH (ref 70–99)
Glucose-Capillary: 194 mg/dL — ABNORMAL HIGH (ref 70–99)

## 2021-03-01 MED ORDER — ADULT MULTIVITAMIN W/MINERALS CH
1.0000 | ORAL_TABLET | Freq: Every day | ORAL | Status: DC
  Administered 2021-03-01 – 2021-03-07 (×7): 1 via ORAL
  Filled 2021-03-01 (×7): qty 1

## 2021-03-01 MED ORDER — ENSURE MAX PROTEIN PO LIQD
11.0000 [oz_av] | Freq: Every day | ORAL | Status: DC
  Administered 2021-03-01 – 2021-03-06 (×4): 11 [oz_av] via ORAL
  Filled 2021-03-01 (×9): qty 330

## 2021-03-01 NOTE — Telephone Encounter (Signed)
Called patient to let her know that [name] will take her to surgery Friday 03/02/21 at 1:00pm. She did not pick up, so I left a voicemail to advise. Also encouraged to call back with any questions

## 2021-03-01 NOTE — Progress Notes (Signed)
Subjective:  65 year old female presenting to the hospital with cellulitis, dehiscence of surgical wound.  States that she is feeling well this morning without any new concerns.  Denies any fevers or chills.  No nausea or vomiting.  No chest pain or shortness of breath.  Objective: AAO x3, NAD-resting in bed comfortably DP/PT pulses palpable bilaterally, CRT less than 3 seconds Sutures intact to the fifth metatarsal without any surrounding edema, erythema or signs of infection.  Continued edema and erythema mostly to the hallux and first MPJ area.  Incision of the plantar first MPJ with sutures intact and bloody drainage identified.  There is bloody drainage on the bandage and no purulence.  There is no fluctuation crepitation there is no malodor. No pain with calf compression, swelling, warmth, erythema  Assessment: Recurrence of infection left foot  Plan: I again discussed with conservative and surgical options for her.  Given the reoccurrence of the infection at the left first MPJ at this point I recommend at least a partial first ray amputation.  We can schedule her for tomorrow for this.  We discussed the risks of surgery including, but not limited to, spread of infection, further amputation, delayed or nonhealing as well as general risks of surgery including stroke, heart attack, death.  She understands surgery and wishes to go ahead and proceed with this tomorrow.  N.p.o. after midnight.  Consent to be done. Awaiting updated vascular studies.  Celesta Gentile, DPM

## 2021-03-01 NOTE — Progress Notes (Signed)
Initial Nutrition Assessment  DOCUMENTATION CODES:   Morbid obesity  INTERVENTION:    Ensure Max po once daily, each supplement provides 150 kcal and 30 grams of protein.   MVI with minerals daily.  High Protein education provided.  NUTRITION DIAGNOSIS:   Increased nutrient needs related to wound healing as evidenced by estimated needs.  GOAL:   Patient will meet greater than or equal to 90% of their needs  MONITOR:   PO intake,Supplement acceptance,Skin  REASON FOR ASSESSMENT:   Consult Wound healing  ASSESSMENT:     65 yo female admitted with cellulitis, dehiscence of L foot/toe surgical wound incision. PMH includes vitamin D deficiency, neuropathy, hypercholesterolemia, DM-2, CAD, HTN, neuropathy, AICD.  Plans for left first ray amputation 5/27.   Patient reports good appetite and good intake now and PTA at home. She lives with her mother who helps her by preparing meals and with dressing changes. She has had no recent weight changes.  Discussed with patient the importance of adequate protein intake and good glycemic control to support wound healing. She states she has had a few amputations in the past and had no issues with healing. Reviewed good dietary sources of protein and ways to increase protein intake with patient. She was very receptive. Patient drinks Fair Life Core Protein shakes at home. Encouraged her to continue protein supplements at home. Will order Ensure Max Protein shake once daily while in the hospital.   Labs reviewed.  CBG: [id number]  Medications reviewed and include calcitriol, ferrous sulfate, Novolog SSI, Lantus.   NUTRITION - FOCUSED PHYSICAL EXAM:  Flowsheet Row Most Recent Value  Orbital Region No depletion  Upper Arm Region No depletion  Thoracic and Lumbar Region No depletion  Buccal Region No depletion  Temple Region No depletion  Clavicle Bone Region No depletion  Clavicle and Acromion Bone Region No depletion  Scapular  Bone Region No depletion  Dorsal Hand Mild depletion  Patellar Region No depletion  Anterior Thigh Region No depletion  Posterior Calf Region No depletion  Edema (RD Assessment) Mild  Hair Reviewed  Eyes Reviewed  Mouth Reviewed  Skin Reviewed  Nails Reviewed       Diet Order:   Diet Order            Diet Carb Modified Fluid consistency: Thin; Room service appropriate? Yes  Diet effective now                 EDUCATION NEEDS:   Education needs have been addressed  Skin:  Skin Assessment: Skin Integrity Issues: Skin Integrity Issues:: Other (Comment),Incisions Incisions: L foot surgical incision open and draining Other: L tibial non pressure wound   Last BM:  5/25  Height:   Ht Readings from Last 1 Encounters:  02/27/21 5\' 8"  (1.727 m)    Weight:   Wt Readings from Last 1 Encounters:  02/27/21 130 kg    BMI:  Body mass index is 43.58 kg/m.  Estimated Nutritional Needs:   Kcal:  2000-2300  Protein:  120-140 gm  Fluid:  >/= 2.2 L    Lucas Mallow, RD, LDN, CNSC Please refer to Amion for contact information.

## 2021-03-01 NOTE — Consult Note (Signed)
[facility] Nurse Consult Note: Patient receiving care in [place]. Reason for Consult: left shin wound Wound type: Per note from Dr. Celesta Gentile on 02/27/21 at 1144, the patient reports the area on the left lower lateral leg is an "old hematoma from when she hit her leg during last hospitalization on a wheelchair". Pressure Injury POA: Yes/No/NA Measurement: To be provided by the bedside RN in the flowsheet section Wound bed: see photo Drainage (amount, consistency, odor)  Periwound: Dressing procedure/placement/frequency: Gently cleanse left lateral lower leg wound with soap and water, pat dry. Place a Xeroform gauze Kellie Simmering [phone]) over the wound, secure with kerlex. Change daily. Monitor the wound area(s) for worsening of condition such as: Signs/symptoms of infection,  Increase in size,  Development of or worsening of odor, Development of pain, or increased pain at the affected locations.  Notify the medical team if any of these develop.  Thank you for the consult. [facility] nurse will not follow at this time.  Please re-consult the [facility] team if needed.  Val Riles, RN, MSN, CWOCN, CNS-BC, pager [phone]

## 2021-03-01 NOTE — Progress Notes (Signed)
PROGRESS NOTE  [name] DGU:[id number] DOB: [birth year] DOA: 02/26/2021 PCP: [name], MD   LOS: 2 days   Brief Narrative / Interim history: 65 year old female with nonischemic cardiomyopathy/AICD in place, A. fib, DM 2, OSA, hypothyroidism, chronic kidney disease stage IV who recently had left foot osteomyelitis and was admitted last month status post bone resection of the fifth proximal phalanx and metatarsal and sesamoidectomy was discharged home on May 3.  At that time vascular was also consulted and she was status post angioplasty of 3 separate in-stent stenosis in the left superficial femoral artery.  Intraoperative cultures grew Enterococcus and staph aureus and capitis, all pansensitive, per ID she was narrowed to Augmentin.  She has had worsening discharge from her foot and came back to the hospital from her podiatrist office.  Podiatry consulted  Subjective / 24h Interval events: No foot pain, no fever, no chills.  No chest pain, shortness of breath  Assessment & Plan: Principal Problem Left foot cellulitis, concerning for osteomyelitis-patient was started on empiric antibiotics with vancomycin and cefepime, cultures were sent.  Podiatry consulted, appreciate input, she will be taken to the OR for amputation tomorrow  Active Problems Peripheral arterial disease-she just recently had angioplasty few weeks ago.  Repeat ABI pending  DM2-poorly controlled, with hyperglycemia most recent A1c 8.1.  Continue Lantus, sliding scale.  Continue gabapentin for neuropathy.  CBGs well controlled  CBG (last 3)  Recent Labs    03/01/21 0416 03/01/21 0755 03/01/21 1110  GLUCAP 121* 163* 156*    Paroxysmal A. fib, NSVT-currently on amiodarone, dual antiplatelet therapy which is now on hold.  Not sure the context of her prior A. fib episode but currently not on anticoagulation.  He is afebrile, respiratory effort is  Chronic kidney disease, stage IV-Baseline creatinine 2.0-2.5,  currently better than baseline  Coronary artery disease -no chest pain, continue statin, Coreg, Imdur  Chronic systolic CHF, mixed ischemic/nonischemic cardiomyopathy with left bundle branch block -in 2020 her EF was 10-15%, now has a CRT and most recent 2D echo December 2021 showed EF 50-55%  Hypothyroidism-continue Synthroid  OSA-continue CPAP  Anemia of chronic disease-hemoglobin stable, will monitor postoperatively  Scheduled Meds: . amiodarone  200 mg Oral Daily  . atorvastatin  10 mg Oral Daily  . calcitRIOL  0.5 mcg Oral q morning  . carvedilol  3.125 mg Oral q morning  . DULoxetine  30 mg Oral QHS  . ferrous sulfate  325 mg Oral q morning  . gabapentin  600 mg Oral QHS  . insulin aspart  0-9 Units Subcutaneous Q4H  . insulin glargine  22 Units Subcutaneous Q2200  . isosorbide mononitrate  30 mg Oral Daily  . levothyroxine  50 mcg Oral Q0600  . multivitamin with minerals  1 tablet Oral Daily  . Ensure Max Protein  11 oz Oral Daily   Continuous Infusions: . ceFEPime (MAXIPIME) IV 2 g (03/01/21 0408)  . vancomycin 2,000 mg (03/01/21 0404)   PRN Meds:.morphine injection, nitroGLYCERIN  Diet Orders (From admission, onward)    Start     Ordered   02/27/21 1554  Diet Carb Modified Fluid consistency: Thin; Room service appropriate? Yes  Diet effective now       Question Answer Comment  Diet-HS Snack? Nothing   Calorie Level Medium 1600-2000   Fluid consistency: Thin   Room service appropriate? Yes      02/27/21 1553          DVT prophylaxis: SCDs Start: 02/27/21  0331     Code Status: Full Code  Family Communication: No family present at bedside  Status is: Inpatient  Remains inpatient appropriate because:Inpatient level of care appropriate due to severity of illness   Dispo: The patient is from: Home              Anticipated d/c is to: Home              Patient currently is not medically stable to d/c.   Difficult to place patient No   Level of care:  Telemetry Medical  Consultants:  Podiatry  Procedures:  None   Microbiology  Blood cultures 5/23-no growth  Antimicrobials: Vancomycin 5/23 >> Cefepime 5/23 >>   Objective: Vitals:   03/01/21 0029 03/01/21 0513 03/01/21 0935 03/01/21 1225  BP: (!) 146/61 (!) 136/55  (!) 168/61  Pulse: 64 (!) 56 70 64  Resp: 18 18  16   Temp: 98.3 F (36.8 C) 97.7 F (36.5 C)  98 F (36.7 C)  TempSrc: Oral Oral  Oral  SpO2: 99% 99%  100%  Weight:      Height:        Intake/Output Summary (Last 24 hours) at 03/01/2021 1246 Last data filed at 02/28/2021 2200 Gross per 24 hour  Intake 240 ml  Output 300 ml  Net -60 ml   Filed Weights   02/27/21 0100 02/27/21 1729  Weight: 130 kg 130 kg    Examination:  Constitutional: NAD Eyes: No icterus ENMT: mmm Neck: normal, supple Respiratory: Lungs are clear bilaterally, no wheezing or crackles Cardiovascular: Heart is regular, Abdomen: Nondistended, bowel sounds positive Musculoskeletal: no clubbing / cyanosis.  Skin: No new rashes Neurologic: No focal deficits   Data Reviewed: I have independently reviewed following labs and imaging studies   CBC: Recent Labs  Lab 02/26/21 2000 02/27/21 1917 03/01/21 0309  WBC 7.3 5.5 5.7  NEUTROABS 4.5  --   --   HGB 11.5* 10.7* 10.7*  HCT 36.3 33.7* 32.5*  MCV 96.3 95.2 92.9  PLT 184 149* 308*   Basic Metabolic Panel: Recent Labs  Lab 02/26/21 2000 02/27/21 1917 03/01/21 0309  NA 138 139 140  K 4.8 4.9 3.8  CL 103 106 108  CO2 25 25 24   GLUCOSE 154* 190* 119*  BUN 31* 29* 27*  CREATININE 1.80* 1.82* 1.49*  CALCIUM 9.6 8.8* 8.9   Liver Function Tests: Recent Labs  Lab 02/26/21 2000  AST 17  ALT 15  ALKPHOS 64  BILITOT 0.7  PROT 7.2  ALBUMIN 3.5   Coagulation Profile: No results for input(s): INR, PROTIME in the last 168 hours. HbA1C: No results for input(s): HGBA1C in the last 72 hours. CBG: Recent Labs  Lab 02/28/21 2024 03/01/21 0034 03/01/21 0416  03/01/21 0755 03/01/21 1110  GLUCAP 245* 143* 121* 163* 156*    Recent Results (from the past 240 hour(s))  Culture, blood (routine x 2)     Status: None (Preliminary result)   Collection Time: 02/26/21  7:00 PM   Specimen: BLOOD RIGHT ARM  Result Value Ref Range Status   Specimen Description BLOOD RIGHT ARM  Final   Special Requests   Final    BOTTLES DRAWN AEROBIC AND ANAEROBIC Blood Culture adequate volume   Culture   Final    NO GROWTH 3 DAYS Performed at [facility], [place] [address], [place], [place] [address]    Report Status PENDING  Incomplete  Culture, blood (routine x 2)  Status: None (Preliminary result)   Collection Time: 02/26/21  7:15 PM   Specimen: BLOOD LEFT ARM  Result Value Ref Range Status   Specimen Description BLOOD LEFT ARM  Final   Special Requests   Final    BOTTLES DRAWN AEROBIC ONLY Blood Culture results may not be optimal due to an excessive volume of blood received in culture bottles   Culture   Final    NO GROWTH 3 DAYS Performed at [facility], [place] [address] [place], [place] [address]    Report Status PENDING  Incomplete  Resp Panel by RT-PCR (Flu A&B, Covid) Nasopharyngeal Swab     Status: None   Collection Time: 02/27/21  3:22 AM   Specimen: Nasopharyngeal Swab; Nasopharyngeal(NP) swabs in vial transport medium  Result Value Ref Range Status   SARS Coronavirus 2 by RT PCR NEGATIVE NEGATIVE Final    Comment: (NOTE) SARS-CoV-2 target nucleic acids are NOT DETECTED.  The SARS-CoV-2 RNA is generally detectable in upper respiratory specimens during the acute phase of infection. The lowest concentration of SARS-CoV-2 viral copies this assay can detect is 138 copies/mL. A negative result does not preclude SARS-Cov-2 infection and should not be used as the sole basis for treatment or other patient management decisions. A negative result may occur with  improper specimen collection/handling, submission of specimen other than  nasopharyngeal swab, presence of viral mutation(s) within the areas targeted by this assay, and inadequate number of viral copies(<138 copies/mL). A negative result must be combined with clinical observations, patient history, and epidemiological information. The expected result is Negative.  Fact Sheet for Patients:  EntrepreneurPulse.com.au  Fact Sheet for Healthcare Providers:  IncredibleEmployment.be  This test is no t yet approved or cleared by the [place] FDA and  has been authorized for detection and/or diagnosis of SARS-CoV-2 by FDA under an Emergency Use Authorization (EUA). This EUA will remain  in effect (meaning this test can be used) for the duration of the COVID-19 declaration under Section 564(b)(1) of the Act, 21 U.S.C.section 360bbb-3(b)(1), unless the authorization is terminated  or revoked sooner.       Influenza A by PCR NEGATIVE NEGATIVE Final   Influenza B by PCR NEGATIVE NEGATIVE Final    Comment: (NOTE) The Xpert Xpress SARS-CoV-2/FLU/RSV plus assay is intended as an aid in the diagnosis of influenza from Nasopharyngeal swab specimens and should not be used as a sole basis for treatment. Nasal washings and aspirates are unacceptable for Xpert Xpress SARS-CoV-2/FLU/RSV testing.  Fact Sheet for Patients: EntrepreneurPulse.com.au  Fact Sheet for Healthcare Providers: IncredibleEmployment.be  This test is not yet approved or cleared by the [place] FDA and has been authorized for detection and/or diagnosis of SARS-CoV-2 by FDA under an Emergency Use Authorization (EUA). This EUA will remain in effect (meaning this test can be used) for the duration of the COVID-19 declaration under Section 564(b)(1) of the Act, 21 U.S.C. section 360bbb-3(b)(1), unless the authorization is terminated or revoked.  Performed at [facility], [place] [address] [place], [place] [address]       Radiology Studies: No results found.  Marzetta Board, MD, PhD [facility]  Between 7 am - 7 pm I am available, please contact me via Amion (for emergencies) or Securechat (non urgent messages)  Between 7 pm - 7 am I am not available, please contact night coverage MD/APP via Amion

## 2021-03-02 ENCOUNTER — Inpatient Hospital Stay (HOSPITAL_COMMUNITY): Payer: HMO | Admitting: Certified Registered Nurse Anesthetist

## 2021-03-02 ENCOUNTER — Encounter: Payer: HMO | Admitting: Podiatry

## 2021-03-02 ENCOUNTER — Inpatient Hospital Stay (HOSPITAL_COMMUNITY): Payer: HMO

## 2021-03-02 ENCOUNTER — Encounter (HOSPITAL_COMMUNITY)
Admission: EM | Disposition: A | Discharge status: Home Health | Payer: Self-pay | Source: Home / Self Care | Attending: Internal Medicine

## 2021-03-02 DIAGNOSIS — L0390 Cellulitis, unspecified: Secondary | ICD-10-CM

## 2021-03-02 DIAGNOSIS — I2510 Atherosclerotic heart disease of native coronary artery without angina pectoris: Secondary | ICD-10-CM | POA: Diagnosis not present

## 2021-03-02 DIAGNOSIS — I160 Hypertensive urgency: Secondary | ICD-10-CM

## 2021-03-02 DIAGNOSIS — N184 Chronic kidney disease, stage 4 (severe): Secondary | ICD-10-CM | POA: Diagnosis not present

## 2021-03-02 DIAGNOSIS — E785 Hyperlipidemia, unspecified: Secondary | ICD-10-CM | POA: Diagnosis not present

## 2021-03-02 DIAGNOSIS — I10 Essential (primary) hypertension: Secondary | ICD-10-CM | POA: Diagnosis not present

## 2021-03-02 DIAGNOSIS — L97529 Non-pressure chronic ulcer of other part of left foot with unspecified severity: Secondary | ICD-10-CM

## 2021-03-02 DIAGNOSIS — I70245 Atherosclerosis of native arteries of left leg with ulceration of other part of foot: Secondary | ICD-10-CM

## 2021-03-02 DIAGNOSIS — I739 Peripheral vascular disease, unspecified: Secondary | ICD-10-CM | POA: Diagnosis not present

## 2021-03-02 DIAGNOSIS — L03116 Cellulitis of left lower limb: Secondary | ICD-10-CM | POA: Diagnosis not present

## 2021-03-02 DIAGNOSIS — M86172 Other acute osteomyelitis, left ankle and foot: Secondary | ICD-10-CM | POA: Diagnosis not present

## 2021-03-02 DIAGNOSIS — E1129 Type 2 diabetes mellitus with other diabetic kidney complication: Secondary | ICD-10-CM | POA: Diagnosis not present

## 2021-03-02 HISTORY — PX: AMPUTATION: SHX166

## 2021-03-02 LAB — BASIC METABOLIC PANEL
Anion gap: 8 (ref 5–15)
BUN: 28 mg/dL — ABNORMAL HIGH (ref 8–23)
CO2: 23 mmol/L (ref 22–32)
Calcium: 9.1 mg/dL (ref 8.9–10.3)
Chloride: 107 mmol/L (ref 98–111)
Creatinine, Ser: 1.35 mg/dL — ABNORMAL HIGH (ref 0.44–1.00)
GFR, Estimated: 43 mL/min — ABNORMAL LOW (ref >60)
Glucose, Bld: 234 mg/dL — ABNORMAL HIGH (ref 70–99)
Potassium: 3.9 mmol/L (ref 3.5–5.1)
Sodium: 138 mmol/L (ref 135–145)

## 2021-03-02 LAB — GLUCOSE, CAPILLARY
Glucose-Capillary: 101 mg/dL — ABNORMAL HIGH (ref 70–99)
Glucose-Capillary: 126 mg/dL — ABNORMAL HIGH (ref 70–99)
Glucose-Capillary: 139 mg/dL — ABNORMAL HIGH (ref 70–99)
Glucose-Capillary: 170 mg/dL — ABNORMAL HIGH (ref 70–99)
Glucose-Capillary: 227 mg/dL — ABNORMAL HIGH (ref 70–99)
Glucose-Capillary: 273 mg/dL — ABNORMAL HIGH (ref 70–99)

## 2021-03-02 LAB — CBC
HCT: 34.9 % — ABNORMAL LOW (ref 36.0–46.0)
Hemoglobin: 11.5 g/dL — ABNORMAL LOW (ref 12.0–15.0)
MCH: 30.3 pg (ref 26.0–34.0)
MCHC: 33 g/dL (ref 30.0–36.0)
MCV: 91.8 fL (ref 80.0–100.0)
Platelets: 152 10*3/uL (ref 150–400)
RBC: 3.8 MIL/uL — ABNORMAL LOW (ref 3.87–5.11)
RDW: 13.4 % (ref 11.5–15.5)
WBC: 6.4 10*3/uL (ref 4.0–10.5)
nRBC: 0 % (ref 0.0–0.2)

## 2021-03-02 LAB — MRSA PCR SCREENING: MRSA by PCR: NEGATIVE

## 2021-03-02 SURGERY — AMPUTATION, FOOT, RAY
Anesthesia: Monitor Anesthesia Care | Site: Toe | Laterality: Left

## 2021-03-02 MED ORDER — LIDOCAINE 2% (20 MG/ML) 5 ML SYRINGE
INTRAMUSCULAR | Status: DC | PRN
  Administered 2021-03-02: 60 mg via INTRAVENOUS

## 2021-03-02 MED ORDER — SODIUM CHLORIDE 0.9 % IR SOLN
Status: DC | PRN
  Administered 2021-03-02: 1000 mL

## 2021-03-02 MED ORDER — PROPOFOL 10 MG/ML IV BOLUS
INTRAVENOUS | Status: AC
  Filled 2021-03-02: qty 20

## 2021-03-02 MED ORDER — LACTATED RINGERS IV SOLN: INTRAVENOUS | Status: DC

## 2021-03-02 MED ORDER — BUPIVACAINE HCL (PF) 0.5 % IJ SOLN
INTRAMUSCULAR | Status: AC
  Filled 2021-03-02: qty 30

## 2021-03-02 MED ORDER — ACETAMINOPHEN 500 MG PO TABS
1000.0000 mg | ORAL_TABLET | Freq: Once | ORAL | Status: AC
  Administered 2021-03-02: 1000 mg via ORAL
  Filled 2021-03-02: qty 2

## 2021-03-02 MED ORDER — CHLORHEXIDINE GLUCONATE 0.12 % MT SOLN
15.0000 mL | Freq: Once | OROMUCOSAL | Status: AC
  Administered 2021-03-02: 15 mL via OROMUCOSAL
  Filled 2021-03-02: qty 15

## 2021-03-02 MED ORDER — MIDAZOLAM HCL 5 MG/5ML IJ SOLN
INTRAMUSCULAR | Status: DC | PRN
  Administered 2021-03-02: 1 mg via INTRAVENOUS

## 2021-03-02 MED ORDER — MEPERIDINE HCL 25 MG/ML IJ SOLN: 6.2500 mg | INTRAMUSCULAR | Status: DC | PRN

## 2021-03-02 MED ORDER — LIDOCAINE 1 % OPTIME INJ - NO CHARGE
INTRAMUSCULAR | Status: DC | PRN
  Administered 2021-03-02 (×2): 5 mL

## 2021-03-02 MED ORDER — PROMETHAZINE HCL 25 MG/ML IJ SOLN: 6.2500 mg | INTRAMUSCULAR | Status: DC | PRN

## 2021-03-02 MED ORDER — HEMOSTATIC AGENTS (NO CHARGE) OPTIME
TOPICAL | Status: DC | PRN
  Administered 2021-03-02: 1 via TOPICAL

## 2021-03-02 MED ORDER — LIDOCAINE HCL (PF) 1 % IJ SOLN
INTRAMUSCULAR | Status: AC
  Filled 2021-03-02: qty 30

## 2021-03-02 MED ORDER — HYDROMORPHONE HCL 1 MG/ML IJ SOLN: 0.2500 mg | INTRAMUSCULAR | Status: DC | PRN

## 2021-03-02 MED ORDER — OXYCODONE HCL 5 MG PO TABS
5.0000 mg | ORAL_TABLET | Freq: Once | ORAL | Status: DC | PRN
Start: 2021-03-02 — End: 2021-03-02

## 2021-03-02 MED ORDER — PROPOFOL 10 MG/ML IV BOLUS
INTRAVENOUS | Status: DC | PRN
  Administered 2021-03-02: 30 mg via INTRAVENOUS

## 2021-03-02 MED ORDER — ONDANSETRON HCL 4 MG/2ML IJ SOLN
INTRAMUSCULAR | Status: DC | PRN
  Administered 2021-03-02: 4 mg via INTRAVENOUS

## 2021-03-02 MED ORDER — MIDAZOLAM HCL 2 MG/2ML IJ SOLN
INTRAMUSCULAR | Status: AC
  Filled 2021-03-02: qty 2

## 2021-03-02 MED ORDER — FENTANYL CITRATE (PF) 250 MCG/5ML IJ SOLN
INTRAMUSCULAR | Status: AC
  Filled 2021-03-02: qty 5

## 2021-03-02 MED ORDER — PROPOFOL 500 MG/50ML IV EMUL
INTRAVENOUS | Status: DC | PRN
  Administered 2021-03-02: 75 ug/kg/min via INTRAVENOUS

## 2021-03-02 MED ORDER — DEXAMETHASONE SODIUM PHOSPHATE 10 MG/ML IJ SOLN
INTRAMUSCULAR | Status: DC | PRN
  Administered 2021-03-02: 4 mg via INTRAVENOUS

## 2021-03-02 MED ORDER — FENTANYL CITRATE (PF) 100 MCG/2ML IJ SOLN
INTRAMUSCULAR | Status: DC | PRN
  Administered 2021-03-02: 50 ug via INTRAVENOUS

## 2021-03-02 MED ORDER — 0.9 % SODIUM CHLORIDE (POUR BTL) OPTIME
TOPICAL | Status: DC | PRN
  Administered 2021-03-02: 1000 mL

## 2021-03-02 MED ORDER — MIDAZOLAM HCL 2 MG/2ML IJ SOLN: 0.5000 mg | Freq: Once | INTRAMUSCULAR | Status: DC | PRN

## 2021-03-02 MED ORDER — OXYCODONE HCL 5 MG/5ML PO SOLN: 5.0000 mg | Freq: Once | ORAL | Status: DC | PRN

## 2021-03-02 MED ORDER — ORAL CARE MOUTH RINSE: 15.0000 mL | Freq: Once | OROMUCOSAL | Status: AC

## 2021-03-02 MED ORDER — BUPIVACAINE HCL 0.5 % IJ SOLN
INTRAMUSCULAR | Status: DC | PRN
  Administered 2021-03-02 (×2): 5 mL

## 2021-03-02 SURGICAL SUPPLY — 58 items
BANDAGE ESMARK 6X9 LF (GAUZE/BANDAGES/DRESSINGS) IMPLANT
BLADE AVERAGE 25X9 (BLADE) ×2 IMPLANT
BLADE SURG 10 STRL SS (BLADE) ×1 IMPLANT
BNDG CMPR 9X6 STRL LF SNTH (GAUZE/BANDAGES/DRESSINGS) ×1
BNDG COHESIVE 4X5 TAN STRL (GAUZE/BANDAGES/DRESSINGS) ×2 IMPLANT
BNDG ELASTIC 4X5.8 VLCR STR LF (GAUZE/BANDAGES/DRESSINGS) ×1 IMPLANT
BNDG ESMARK 6X9 LF (GAUZE/BANDAGES/DRESSINGS) ×2
BNDG GAUZE ELAST 4 BULKY (GAUZE/BANDAGES/DRESSINGS) ×1 IMPLANT
CNTNR URN SCR LID CUP LEK RST (MISCELLANEOUS) IMPLANT
CONT SPEC 4OZ STRL OR WHT (MISCELLANEOUS) ×4
COVER WAND RF STERILE (DRAPES) ×2 IMPLANT
CUFF TOURN SGL QUICK 34 (TOURNIQUET CUFF)
CUFF TOURN SGL QUICK 42 (TOURNIQUET CUFF) IMPLANT
CUFF TRNQT CYL 34X4.125X (TOURNIQUET CUFF) IMPLANT
DECANTER SPIKE VIAL GLASS SM (MISCELLANEOUS) ×2 IMPLANT
DRAPE C-ARM MINI (DRAPES) ×1 IMPLANT
DRAPE U-SHAPE 47X51 STRL (DRAPES) ×4 IMPLANT
DRSG MEPILEX SACRM 8.7X9.8 (GAUZE/BANDAGES/DRESSINGS) ×1 IMPLANT
DRSG PAD ABDOMINAL 8X10 ST (GAUZE/BANDAGES/DRESSINGS) ×1 IMPLANT
DURAPREP 26ML APPLICATOR (WOUND CARE) ×2 IMPLANT
ELECT REM PT RETURN 9FT ADLT (ELECTROSURGICAL) ×2
ELECTRODE REM PT RTRN 9FT ADLT (ELECTROSURGICAL) ×1 IMPLANT
GAUZE SPONGE 4X4 12PLY STRL (GAUZE/BANDAGES/DRESSINGS) ×1 IMPLANT
GAUZE SPONGE 4X4 12PLY STRL LF (GAUZE/BANDAGES/DRESSINGS) ×1 IMPLANT
GAUZE XEROFORM 5X9 LF (GAUZE/BANDAGES/DRESSINGS) ×2 IMPLANT
GLOVE BIO SURGEON STRL SZ8 (GLOVE) ×5 IMPLANT
GOWN STRL REUS W/ TWL LRG LVL3 (GOWN DISPOSABLE) ×1 IMPLANT
GOWN STRL REUS W/ TWL XL LVL3 (GOWN DISPOSABLE) ×1 IMPLANT
GOWN STRL REUS W/TWL LRG LVL3 (GOWN DISPOSABLE) ×4
GOWN STRL REUS W/TWL XL LVL3 (GOWN DISPOSABLE) ×2
HANDPIECE INTERPULSE COAX TIP (DISPOSABLE) ×2
KIT BASIN OR (CUSTOM PROCEDURE TRAY) ×2 IMPLANT
KIT TURNOVER KIT B (KITS) ×2 IMPLANT
MICROMATRIX 1000MG (Tissue) ×2 IMPLANT
NDL 25GX 5/8IN NON SAFETY (NEEDLE) ×1 IMPLANT
NEEDLE 25GX 5/8IN NON SAFETY (NEEDLE) ×4 IMPLANT
NS IRRIG 1000ML POUR BTL (IV SOLUTION) ×2 IMPLANT
PACK ORTHO EXTREMITY (CUSTOM PROCEDURE TRAY) ×2 IMPLANT
PAD ARMBOARD 7.5X6 YLW CONV (MISCELLANEOUS) ×4 IMPLANT
PAD CAST 4YDX4 CTTN HI CHSV (CAST SUPPLIES) ×1 IMPLANT
PADDING CAST COTTON 4X4 STRL (CAST SUPPLIES)
SET HNDPC FAN SPRY TIP SCT (DISPOSABLE) IMPLANT
SOLUTION PARTIC MCRMTRX 1000MG (Tissue) IMPLANT
SPONGE LAP 18X18 RF (DISPOSABLE) ×4 IMPLANT
SPONGE SURGIFOAM ABS GEL SZ50 (HEMOSTASIS) ×1 IMPLANT
STAPLER VISISTAT 35W (STAPLE) ×2 IMPLANT
STOCKINETTE IMPERVIOUS LG (DRAPES) ×1 IMPLANT
SUT ETHILON 2 0 PSLX (SUTURE) IMPLANT
SUT MNCRL AB 3-0 PS2 27 (SUTURE) ×1 IMPLANT
SUT PROLENE 3 0 PS 2 (SUTURE) ×7 IMPLANT
SWAB COLLECTION DEVICE MRSA (MISCELLANEOUS) IMPLANT
SWAB CULTURE ESWAB REG 1ML (MISCELLANEOUS) IMPLANT
SYR CONTROL 10ML LL (SYRINGE) ×2 IMPLANT
TOWEL GREEN STERILE (TOWEL DISPOSABLE) ×2 IMPLANT
TOWEL GREEN STERILE FF (TOWEL DISPOSABLE) ×2 IMPLANT
TUBE CONNECTING 12X1/4 (SUCTIONS) ×2 IMPLANT
WATER STERILE IRR 1000ML POUR (IV SOLUTION) ×1 IMPLANT
YANKAUER SUCT BULB TIP NO VENT (SUCTIONS) ×2 IMPLANT

## 2021-03-02 NOTE — Consult Note (Signed)
Hospital Consult    Reason for Consult: Evaluate left lower extremity PAD after arterial duplex Referring Physician: Hospitalist MRN #:  [id number]  History of Present Illness: This is a 65 y.o. female with multiple medical problems that vascular surgery has been consulted to evaluate left lower extremity PAD.  Patient is well-known to vascular surgery and recently on 02/02/2021 she had a left lower extremity arteriogram with Dr. Scot Dock for a nonhealing wound on the left foot.  Prior to intervention she had undergone previous left SFA stenting at [facility] in 2019.  Dr. Scot Dock performed balloon angioplasty of 3 separate in-stent stenosis in the left SFA.  There was some disease identified in the popliteal artery distally but this was left alone given after addressing the SFA in-stent stenosis x3 she had much more brisk runoff into the foot through the anterior tibial and posterior tibial arteries.   She was readmitted with worsening drainage from the left foot wound from her podiatry office.  Duplex was obtained by the primary team that showed a velocity of 496 distal to the stents in the popliteal artery.  It appears on 02/03/2021 she underwent bone resection of the left fifth metatarsal head with part of the proximal phalanx with podiatry.  Today she underwent ray amputation of the left great toe.  Past Medical History:  Diagnosis Date  . Acute myocardial infarction, subendocardial infarction, initial episode of care ([place]) 07/21/2012  . Acute osteomyelitis involving ankle and foot ([place]) 02/06/2015  . Acute systolic heart failure ([place]) 07/21/2012   New onset 07/19/12; admission to [facility] ED. Elevated Troponins.  S/p 2D-echo with EF 20-25%.  S/p cardiac catheterization with stenting LAD.  Repeat 2D-echo 10/2011 with improved EF of 35%.   . Anemia   . Automatic implantable cardioverter-defibrillator in situ    a. MDT CRT-D 06/2014, SN: [id number]  .removed in feb 2020  . CAD (coronary artery  disease)    a. cardiac cath 101/04/2012: PCI/DES to chronically occluded mLAD, consideration PCI to diag branch in 4 weeks.   . Cataract   . Chicken pox   . Chronic kidney disease   . Chronic systolic CHF (congestive heart failure) (HCC)    a. mixed ICM & NICM; b. EF 20-25% by echo 07/2012, mid-dist 2/3 of LV sev HK/AK, mild MR. echo 10/2012: EF 30-35%, sev HK ant-septal & inf walls, GR1DD, mild MR, PASP 33. c. echo 02/2013: EF 30%, GR1DD, mild MR. echo 04/2014: EF 30%, Septal-lat dyssynchrony, global HK, inf AK, GR1DD, mild MR. d. echo 10/2014: EF50-55%, WM nl, GR1DD, septal mild paradox. e. echo 02/2015: EF 50-55%, wm   . Depression   . Heart attack ([place])   . Heel ulcer ([place]) 04/27/2015  . History of blood transfusion ~ 2011   "plasma; had neuropathy; couldn't walk"  . Hypertension   . LBBB (left bundle branch block)   . Neuromuscular disorder ([place])   . Neuropathy 2011  . Obesity, unspecified   . OSA on CPAP    Moderate with AHI 23/hr and now on CPAP at 16cm H2O.  Her DME is [facility]  . Pure hypercholesterolemia   . Sepsis ([place]) 09/2018  . Type II diabetes mellitus ([place])   . Unspecified vitamin D deficiency     Past Surgical History:  Procedure Laterality Date  . ABDOMINAL AORTOGRAM W/LOWER EXTREMITY N/A 02/02/2021   Procedure: ABDOMINAL AORTOGRAM W/LOWER EXTREMITY;  Surgeon: Angelia Mould, MD;  Location: [place] [facility] LAB;  Service: Cardiovascular;  Laterality: N/A;  .  AMPUTATION TOE Right 06/18/2015   Procedure: AMPUTATION TOE;  Surgeon: Samara Deist, DPM;  Location: [facility] ORS;  Service: Podiatry;  Laterality: Right;  . AMPUTATION TOE Left 10/08/2018   Procedure: AMPUTATION TOE LEFT 2ND;  Surgeon: Albertine Patricia, DPM;  Location: [facility] ORS;  Service: Podiatry;  Laterality: Left;  . BACK SURGERY    . BI-VENTRICULAR IMPLANTABLE CARDIOVERTER DEFIBRILLATOR N/A 07/06/2014   Procedure: BI-VENTRICULAR IMPLANTABLE CARDIOVERTER DEFIBRILLATOR  (CRT-D);  Surgeon: Deboraha Sprang, MD;  Location:  [facility] CATH LAB;  Service: Cardiovascular;  Laterality: N/A;  . BI-VENTRICULAR IMPLANTABLE CARDIOVERTER DEFIBRILLATOR  (CRT-D)  07/06/2014  . BILATERAL OOPHORECTOMY  01/2011   ovarian cyst benign  . BIV ICD INSERTION CRT-D N/A 03/31/2019   Procedure: BIV ICD INSERTION CRT-D;  Surgeon: Constance Haw, MD;  Location: [place] [facility] LAB;  Service: Cardiovascular;  Laterality: N/A;  . COLONOSCOPY WITH PROPOFOL Left 02/22/2015   Procedure: COLONOSCOPY WITH PROPOFOL;  Surgeon: Hulen Luster, MD;  Location: [facility] ENDOSCOPY;  Service: Endoscopy;  Laterality: Left;  . CORONARY ANGIOPLASTY WITH STENT PLACEMENT Left 07/2012   new onset systolic CHF; elevated troponins.  Cardiac catheterization with stenting to LAD; EF 15%.  2D-echo: EF 20-25%.  . ESOPHAGOGASTRODUODENOSCOPY N/A 02/22/2015   Procedure: ESOPHAGOGASTRODUODENOSCOPY (EGD);  Surgeon: Hulen Luster, MD;  Location: [facility] ENDOSCOPY;  Service: Endoscopy;  Laterality: N/A;  . I & D EXTREMITY Left 11/28/2020   Procedure: IRRIGATION AND DEBRIDEMENT OF FOOT;  Surgeon: Trula Slade, DPM;  Location: [place];  Service: Podiatry;  Laterality: Left;  . I & D EXTREMITY Left 01/31/2021   Procedure: IRRIGATION AND DEBRIDEMENT EXTREMITY OF LEFT FOOT WITH BONE BIOPSY OF  1ST METATARSAL AND FIFTH METATARSAL;  Surgeon: Felipa Furnace, DPM;  Location: [place];  Service: Podiatry;  Laterality: Left;  . I & D EXTREMITY Left 02/03/2021   Procedure: IRRIGATION AND DEBRIDEMENT EXTREMITY WITH INTERNAL AMPUTATION FIRST AND FIFTH RAY;  Surgeon: Felipa Furnace, DPM;  Location: [place];  Service: Podiatry;  Laterality: Left;  . INCISION AND DRAINAGE ABSCESS Right 2007   groin; with ICU stay due to sepsis.  Marland Kitchen LAPAROSCOPIC CHOLECYSTECTOMY  2011  . LEFT HEART CATH AND CORONARY ANGIOGRAPHY N/A 10/05/2018   Procedure: LEFT HEART CATH AND CORONARY ANGIOGRAPHY;  Surgeon: Minna Merritts, MD;  Location: [place] [facility] LAB;  Service: Cardiovascular;  Laterality: N/A;  . LEFT HEART  CATHETERIZATION WITH CORONARY ANGIOGRAM N/A 07/21/2012   Procedure: LEFT HEART CATHETERIZATION WITH CORONARY ANGIOGRAM;  Surgeon: Jolaine Artist, MD;  Location: [facility] CATH LAB;  Service: Cardiovascular;  Laterality: N/A;  . [place]   L4-5  . PACEMAKER REMOVAL  11/2018   due to infection around pacemaker  . PERCUTANEOUS CORONARY STENT INTERVENTION (PCI-S) N/A 07/23/2012   Procedure: PERCUTANEOUS CORONARY STENT INTERVENTION (PCI-S);  Surgeon: Sherren Mocha, MD;  Location: [facility] CATH LAB;  Service: Cardiovascular;  Laterality: N/A;  . PERIPHERAL VASCULAR BALLOON ANGIOPLASTY Left 10/06/2018   Procedure: PERIPHERAL VASCULAR BALLOON ANGIOPLASTY;  Surgeon: Katha Cabal, MD;  Location: [place] CV LAB;  Service: Cardiovascular;  Laterality: Left;  . PERIPHERAL VASCULAR CATHETERIZATION N/A 02/10/2015   Procedure: Picc Line Insertion;  Surgeon: Katha Cabal, MD;  Location: [place] CV LAB;  Service: Cardiovascular;  Laterality: N/A;  . RIGHT HEART CATH N/A 03/29/2019   Procedure: RIGHT HEART CATH;  Surgeon: Jolaine Artist, MD;  Location: [place] CV LAB;  Service: Cardiovascular;  Laterality: N/A;  . TEE WITHOUT CARDIOVERSION N/A  10/02/2018   Procedure: TRANSESOPHAGEAL ECHOCARDIOGRAM (TEE);  Surgeon: Wellington Hampshire, MD;  Location: [facility] ORS;  Service: Cardiovascular;  Laterality: N/A;  . [place]  . VAGINAL HYSTERECTOMY  01/2011   Fibroids/DUB.  Ovaries removed. [place].    Allergies  Allergen Reactions  . Nsaids     Kidney disease    Prior to Admission medications   Medication Sig Start Date End Date Taking? Authorizing Provider  amiodarone (PACERONE) 200 MG tablet Take 1 tablet (200 mg total) by mouth daily. 10/30/20  Yes Bensimhon, Shaune Pascal, MD  amoxicillin-clavulanate (AUGMENTIN) 875-125 MG tablet Take 1 tablet by mouth 2 (two) times daily. Patient taking differently: Take 1 tablet by mouth 2 (two) times daily. Start date: Unknown per  patient. Stop Date on 03/03/21 02/16/21  Yes Felipa Furnace, DPM  aspirin EC 81 MG EC tablet Take 1 tablet (81 mg total) by mouth daily. Swallow whole. 02/07/21  Yes Nita Sells, MD  atorvastatin (LIPITOR) 10 MG tablet Take 1 tablet (10 mg total) by mouth daily. 02/07/21  Yes Nita Sells, MD  calcitRIOL (ROCALTROL) 0.5 MCG capsule Take 0.5 mcg by mouth every morning. 12/25/20  Yes [provider]  Calcium Carb-Cholecalciferol (CALCIUM CARBONATE-VITAMIN D3) 600-400 MG-UNIT TABS Take 1 tablet by mouth 2 (two) times a day.   Yes [provider]  carvedilol (COREG) 3.125 MG tablet Take 3.125 mg by mouth every morning.   Yes [provider]  clopidogrel (PLAVIX) 75 MG tablet TAKE 1 TABLET BY MOUTH ONCE DAILY Patient taking differently: Take 75 mg by mouth daily. 11/10/20  Yes Minna Merritts, MD  DULoxetine (CYMBALTA) 30 MG capsule Take 1 capsule (30 mg total) by mouth at bedtime. 01/26/21  Yes Patel, Donika K, DO  ferrous sulfate 325 (65 FE) MG tablet Take 325 mg by mouth every morning.   Yes [provider]  gabapentin (NEURONTIN) 600 MG tablet Take 1 tablet (600 mg total) by mouth at bedtime. 02/15/21  Yes [name], MD  insulin aspart (NOVOLOG FLEXPEN) 100 UNIT/ML FlexPen Inject 5-10 Units into the skin 3 (three) times daily before meals. Sugar 100-149 - 5 units.  150-199- 6 units.  200-249- 7 units.  250-299- 8 units. 300-349- 9 units.  350+ 10 units. 02/15/21  Yes [name], MD  Insulin Pen Needle (PEN NEEDLES) 32G X 6 MM MISC 1 each by Does not apply route 4 (four) times daily. 09/06/19  Yes Jacelyn Pi, Lilia Argue, MD  isosorbide mononitrate (IMDUR) 30 MG 24 hr tablet Take 1 tablet (30 mg total) by mouth daily. Needs appt 05/16/20  Yes Bensimhon, Shaune Pascal, MD  LANTUS SOLOSTAR 100 UNIT/ML Solostar Pen INJECT 22 UNITS UNDER THE SKIN DAILY. Patient taking differently: Inject 22 Units into the skin daily. 10/02/20  Yes [name], MD   levothyroxine (SYNTHROID) 50 MCG tablet TAKE 1 TABLET BY MOUTH ONCE DAILY BEFOREBREAKFAST Patient taking differently: Take 50 mcg by mouth daily before breakfast. 10/30/20  Yes [name], MD  Multiple Vitamin (MULTIVITAMIN WITH MINERALS) TABS tablet Take 1 tablet by mouth every morning.   Yes [provider]  Omega-3 Fatty Acids (FISH OIL) 1000 MG CAPS Take 1,000 mg by mouth 2 (two) times a day.   Yes [provider]  traMADol (ULTRAM) 50 MG tablet Take 1-2 tablets (50-100 mg total) by mouth at bedtime as needed. Patient taking differently: Take 50-100 mg by mouth at bedtime as needed for moderate pain. 02/15/21  Yes  [name], MD  amoxicillin-clavulanate (AUGMENTIN) 500-125 MG tablet Take 1 tablet (500 mg total) by mouth every 12 (twelve) hours. Patient not taking: Reported on 02/27/2021 02/06/21   Nita Sells, MD  nitroGLYCERIN (NITROSTAT) 0.4 MG SL tablet Place 1 tablet (0.4 mg total) under the tongue every 5 (five) minutes as needed for chest pain. 03/28/20 06/26/20  Deboraha Sprang, MD  torsemide (DEMADEX) 20 MG tablet Take 1 tablet (20 mg total) by mouth daily as needed (For fluid). Patient not taking: No sig reported 02/18/21   [name], MD    Social History   Socioeconomic History  . Marital status: Widowed    Spouse name: [name]  . Number of children: 2  . Years of education: 55  . Highest education level: High school graduate  Occupational History  . Occupation: disabled    Fish farm manager: UNEMPLOYED    Comment: 03/2010 for peripheral neuropathy  . 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  Tobacco Use  . Smoking status: Never Smoker  . Smokeless tobacco: Never Used  Vaping Use  . Vaping Use: Never used  Substance and Sexual Activity  . Alcohol use: No    Alcohol/week: 0.0 standard drinks  . Drug use: No  . Sexual activity: Not Currently    Birth control/protection: Post-menopausal, Surgical  Other Topics Concern  . Not on file   Social History Narrative   Widowed, husband died when she was in surgery.        Children:  2 children (daughter, son). Two grandsons and 2 step grandchildren.      Lives: with husband, daughter, son-in-law, 2 grandsons, and 1 on the way.      Employment: disability for peripheral neuropathy 2012.  Previously had home daycare.   Social Determinants of Health   Financial Resource Strain: Not on file  Food Insecurity: Not on file  Transportation Needs: Not on file  Physical Activity: Not on file  Stress: Not on file  Social Connections: Not on file  Intimate Partner Violence: Not on file     Family History  Problem Relation Age of Onset  . Diabetes Mother   . Hypertension Mother   . Arthritis Mother        knees, lumbar DDD, cervical DDD  . Cancer Father        prostate,skin,lymphoma.  . Cancer Brother 70       bladder cancer; non-smoker  . Diabetes Maternal Grandmother   . Heart disease Maternal Grandmother   . COPD Maternal Grandmother   . Diabetes Paternal Grandmother   . Obesity Brother   . Diabetes Son   . Hypertension Son   . Cancer Maternal Grandfather   . Diabetes Paternal Grandfather     ROS: [x]  Positive   [ ]  Negative   [ ]  All sytems reviewed and are negative  Cardiovascular: []  chest pain/pressure []  palpitations []  SOB lying flat []  DOE []  pain in legs while walking []  pain in legs at rest []  pain in legs at night []  non-healing ulcers []  hx of DVT []  swelling in legs  Pulmonary: []  productive cough []  asthma/wheezing []  home O2  Neurologic: []  weakness in []  arms []  legs []  numbness in []  arms []  legs []  hx of CVA []  mini stroke [] difficulty speaking or slurred speech []  temporary loss of vision in one eye []  dizziness  Hematologic: []  hx of cancer []  bleeding problems []  problems with blood clotting easily  Endocrine:   []   diabetes []  thyroid disease  GI []  vomiting blood []  blood in stool  GU: []  CKD/renal failure []   HD--[]  M/W/F or []  T/T/S []  burning with urination []  blood in urine  Psychiatric: []  anxiety []  depression  Musculoskeletal: []  arthritis []  joint pain  Integumentary: []  rashes []  ulcers  Constitutional: []  fever []  chills   Physical Examination  Vitals:   03/02/21 1100 03/02/21 1228  BP: (!) 155/66 (!) 156/61  Pulse: 61 61  Resp: 18 17  Temp: 98.5 F (36.9 C) 97.9 F (36.6 C)  SpO2: 99% 100%   Body mass index is 43.58 kg/m.  General:  NAD Gait: Not observed HENT: WNL, normocephalic Pulmonary: normal non-labored breathing Cardiac: regular, without  Murmurs, rubs or gallops Vascular Exam/Pulses: Left femoral pulse palpable Left foot wrapped after surgery Brisk monophasic PT signal left foot - unable to evaluate DP given dressing Extremities: Left foot wrapped Musculoskeletal: no muscle wasting or atrophy  Neurologic: A&O X 3; Appropriate Affect ; SENSATION: normal; MOTOR FUNCTION:  moving all extremities equally. Speech is fluent/normal  CBC    Component Value Date/Time   WBC 6.4 03/02/2021 0235   RBC 3.80 (L) 03/02/2021 0235   HGB 11.5 (L) 03/02/2021 0235   HGB 11.2 09/06/2019 1539   HCT 34.9 (L) 03/02/2021 0235   HCT 33.6 (L) 09/06/2019 1539   PLT 152 03/02/2021 0235   PLT 196 09/06/2019 1539   MCV 91.8 03/02/2021 0235   MCV 87 09/06/2019 1539   MCH 30.3 03/02/2021 0235   MCHC 33.0 03/02/2021 0235   RDW 13.4 03/02/2021 0235   RDW 14.2 09/06/2019 1539   LYMPHSABS 2.1 02/26/2021 2000   LYMPHSABS 1.7 04/15/2018 1532   MONOABS 0.5 02/26/2021 2000   EOSABS 0.1 02/26/2021 2000   EOSABS 0.1 04/15/2018 1532   BASOSABS 0.0 02/26/2021 2000   BASOSABS 0.0 04/15/2018 1532    BMET    Component Value Date/Time   NA 138 03/02/2021 0235   NA 140 09/06/2019 1539   K 3.9 03/02/2021 0235   CL 107 03/02/2021 0235   CO2 23 03/02/2021 0235   GLUCOSE 234 (H) 03/02/2021 0235   BUN 28 (H) 03/02/2021 0235   BUN 62 (H) 09/06/2019 1539   CREATININE 1.35 (H)  03/02/2021 0235   CREATININE 1.51 (H) 08/06/2016 1431   CALCIUM 9.1 03/02/2021 0235   GFRNONAA 43 (L) 03/02/2021 0235   GFRNONAA 46 (L) 12/21/2013 1132   GFRAA 23 (L) 11/05/2019 1327   GFRAA 53 (L) 12/21/2013 1132    COAGS: Lab Results  Component Value Date   INR 1.2 11/28/2020   INR 1.0 10/02/2020   INR 1.1 12/14/2018     Non-Invasive Vascular Imaging:     OWER EXTREMITY ARTERIAL DUPLEX STUDY   Patient Name: [name] Date of Exam:  03/02/2021  Medical Rec #: [id number]     Accession #:  [id number]  Date of Birth: [birth year]      Patient Gender: F  Patient Age:  65Y  Exam [facility]  Procedure:   VAS [place] LOWER EXTREMITY ARTERIAL DUPLEX  Referring Phys: [id number] MATTHEW R WAGONER    ---------------------------------------------------------------------------  -----    High Risk Factors: Hypertension, hyperlipidemia, coronary artery disease.   Other Factors: Left foot osteomyelitis with last month status post bone         resection of the 5th proximal phalanx and metatarsal and         sesamoidectomy on 02/03/2021.  Vascular Interventions: Angioplasty left  SFA stent on 02/02/21. Know             narrowing/stenosis in left distal to popliteal  stent.  Current ABI:      Right 1.1   Limitations: Left ankle wound with bandages. Patient was not able to  tolerate BP        cuff pressure to obtain ABI on the left side.   Performing Technologist: Oda Cogan RDMS, RVT     Examination Guidelines: A complete evaluation includes B-mode imaging,  spectral  Doppler, color Doppler, and power Doppler as needed of all accessible  portions  of each vessel. Bilateral testing is considered an integral part of a  complete  examination. Limited examinations for reoccurring indications may be  performed  as noted.          +-----------+--------+-----+--------+----------+--------+   LEFT    PSV cm/sRatioStenosisWaveform Comments  +-----------+--------+-----+--------+----------+--------+  CFA Mid  123          triphasic       +-----------+--------+-----+--------+----------+--------+  DFA    85          biphasic       +-----------+--------+-----+--------+----------+--------+  SFA Prox                  stent    +-----------+--------+-----+--------+----------+--------+  SFA Mid                  stent    +-----------+--------+-----+--------+----------+--------+  SFA Distal                 stent    +-----------+--------+-----+--------+----------+--------+  POP Mid                  stent    +-----------+--------+-----+--------+----------+--------+  POP Distal                       +-----------+--------+-----+--------+----------+--------+  ATA Prox  48          monophasic      +-----------+--------+-----+--------+----------+--------+  ATA Distal 41          monophasic      +-----------+--------+-----+--------+----------+--------+  PTA Prox  64          monophasic      +-----------+--------+-----+--------+----------+--------+  PTA Distal 75          monophasic      +-----------+--------+-----+--------+----------+--------+  PERO Distal21          monophasic      +-----------+--------+-----+--------+----------+--------+     Left Stent(s):  +---------------+--------+--------+----------+--------+  SFA      PSV cm/sStenosisWaveform Comments  +---------------+--------+--------+----------+--------+  Prox to Stent 94       monophasic      +---------------+--------+--------+----------+--------+  Proximal Stent 105       monophasic       +---------------+--------+--------+----------+--------+  Mid Stent   116       monophasic      +---------------+--------+--------+----------+--------+  Distal Stent  137       monophasic      +---------------+--------+--------+----------+--------+  Distal to Stent106       monophasic      +---------------+--------+--------+----------+--------+     Left Stent +---------------+--------+---------------+----------+--------+  POP A     PSV cm/sStenosis    Waveform Comments  +---------------+--------+---------------+----------+--------+  Prox to Stent 125           monophasic      +---------------+--------+---------------+----------+--------+  Distal Stent  265   50-99% stenosismonophasic      +---------------+--------+---------------+----------+--------+  Distal to [phone]  50-99% stenosismonophasic      +---------------+--------+---------------+----------+--------+          Summary:  Left: Patent left superficial femoral artery stent without significant  stensois. Left 50-99% stenosis at the distal popliteal stent segment.     See table(s) above for measurements and observations.      Electronically signed by Monica Martinez MD on 03/02/2021 at 12:34:51 PM.    ASSESSMENT/PLAN: This is a 65 y.o. female with multiple medical comorbidities that vascular surgery has been asked to re-evaluate her peripheral arterial disease after repeat duplex here in the hospital showed a likely high-grade stenosis in the popliteal artery distal to her existing SFA stents.  She recently underwent left leg intervention by Dr. Scot Dock on 02/02/21 for a nonhealing left foot wound where he treated 3 areas of in-stent restenosis for SFA stents that were placed at [place] in 2019.  The popliteal disease was identified on arteriogram but she had much more brisk flow into the foot after  intervening on the SFA stenosis with two vessel runoff in AT and PT.  Her toe pressure is improved from 20 prior to intervention to 90 today which should be adequate for wound healing.  She does have a great PT signal at the ankle although monophasic and has in-line flow.  I will make Dr. Scot Dock aware regarding her admission and he can decide if he feels additional intervention is warranted and if discharged she has follow-up with him next Wednesday.      Marty Heck, MD [facility] Office: [place]

## 2021-03-02 NOTE — Op Note (Signed)
PATIENT:  [name]  65 y.o. female  PRE-OPERATIVE DIAGNOSIS:  Osteomyeltiis  POST-OPERATIVE DIAGNOSIS:  Osteomyeltiis  PROCEDURE:  Procedure(s): AMPUTATION RAY, left great toe (Left)  SURGEON:  Surgeon(s) and Role:    * Trula Slade, DPM - Primary  PHYSICIAN ASSISTANT:   ASSISTANTS: none   ANESTHESIA:   general  EBL:  50 mL   BLOOD ADMINISTERED:none  DRAINS: none   LOCAL MEDICATIONS USED:  Amount: 20 ml lidocaine and marcaine plain  SPECIMEN:  Source of Specimen:  bone for pathology, bone culture clean margin   DISPOSITION OF SPECIMEN:  PATHOLOGY  COUNTS:  YES  TOURNIQUET:  * No tourniquets in log *  DICTATION: .Dragon Dictation  PLAN OF CARE: Admit to inpatient   PATIENT DISPOSITION:  PACU - hemodynamically stable.   Delay start of Pharmacological VTE agent (>24hrs) due to surgical blood loss or risk of bleeding: no  Indications for surgery: 65 year old female was admitted to the hospital with wound dehiscence, cellulitis of the left foot.  She has had previous debridements, IV antibiotics.  She states that when she finished the antibiotics the infection flared back up.  Due to this I discussed with her partial first ray amputation.  I discussed the surgical's postoperative course.  Alternatives risks and complications were discussed.  No promises or guarantees given.  All questions answered the best my ability.  Procedure in detail: Patient was both verbally and visually identified by myself, nursing staff, the anesthesia staff preoperatively.  She was then transferred to the operating room via stretcher and placed on operative table in supine position.  After adequate plane of anesthesia was obtained a timeout was performed and initial 10 cc of lidocaine, Marcaine plain was infiltrated in a regional block fashion.  Attention was directed to the plantar aspect the foot on the prior incision.  A modified racquet shaped incision was  planned.  I excised the previous wound on the plantar aspect and circumferentially around the first MPJ removing the hallux.  At this time the first metatarsal identified the distal portion appeared to be softened.  Sagittal bone saw was utilized to resect the metatarsal head initially just proximal to the neck.  At this time the remaining bone still appeared to be soft.  A dorsal linear incision was then made at this point creating a flap of skin.  I then resected further first metatarsal bone down to healthy, bleeding and what appeared to be viable bone.  A clean bone culture was obtained of the proximal first metatarsal.  The wound was copiously irrigated saline hemostasis achieved.  All bleeders were ligated and cauterized as necessary.  I used Gelfoam to help with hemostasis.  I utilized 3-0 Prolene to help close the deep structures on the plantar aspect of the flap and the skin was then closed with 3-0 Prolene.  I also utilized 1000 mL of micro matrix that was placed in the wound to help facilitate wound healing.  The dorsal skin was then closed with 3-0 Prolene.  Xeroform was applied followed by 4 x 4's, Kerlix, ABD pads and an Ace bandage.  There did appear to be bleeding during the case although she is on Plavix.  Given her arterial studies recommended vascular evaluation as she had a follow-up with them next week.  Remain inpatient on IV antibiotics and await bone culture results.  Weightbearing to heel in cam boot.  Encourage elevation.

## 2021-03-02 NOTE — Anesthesia Procedure Notes (Signed)
Procedure Name: MAC Date/Time: 03/02/2021 1:32 PM Performed by: Leonor Liv, CRNA Oxygen Delivery Method: Simple face mask Induction Type: IV induction Dental Injury: Teeth and Oropharynx as per pre-operative assessment

## 2021-03-02 NOTE — Anesthesia Postprocedure Evaluation (Signed)
Anesthesia Post Note  Patient: [name]  Procedure(s) Performed: AMPUTATION RAY, left great toe (Left Toe)     Patient location during evaluation: PACU Anesthesia Type: MAC Level of consciousness: awake and alert, patient cooperative and oriented Pain management: pain level controlled Vital Signs Assessment: post-procedure vital signs reviewed and stable Respiratory status: spontaneous breathing, nonlabored ventilation and respiratory function stable Cardiovascular status: blood pressure returned to baseline and stable Postop Assessment: no apparent nausea or vomiting Anesthetic complications: no   No complications documented.  Last Vitals:  Vitals:   03/02/21 1513 03/02/21 1540  BP: (!) 114/53 (!) 123/53  Pulse: (!) 58 (!) 56  Resp: (!) 22 19  Temp:  36.6 C  SpO2: 98% 100%    Last Pain:  Vitals:   03/02/21 1540  TempSrc: Oral  PainSc:                  Tashawnda Bleiler,E. Ival Basquez

## 2021-03-02 NOTE — Progress Notes (Signed)
PROGRESS NOTE  [name] QQV:[id number] DOB: 08-Mar-1956 DOA: 02/26/2021 PCP: [name], MD   LOS: 3 days   Brief Narrative / Interim history: 65 year old female with nonischemic cardiomyopathy/AICD in place, A. fib, DM 2, OSA, hypothyroidism, chronic kidney disease stage IV who recently had left foot osteomyelitis and was admitted last month status post bone resection of the fifth proximal phalanx and metatarsal and sesamoidectomy was discharged home on May 3.  At that time vascular was also consulted and she was status post angioplasty of 3 separate in-stent stenosis in the left superficial femoral artery.  Intraoperative cultures grew Enterococcus and staph aureus and capitis, all pansensitive, per ID she was narrowed to Augmentin.  She has had worsening discharge from her foot and came back to the hospital from her podiatrist office.  Podiatry consulted  Subjective / 24h Interval events: She is doing well this morning, no foot pain, no chest pain, no shortness of breath.  Awaiting surgery  Assessment & Plan: Principal Problem Left foot cellulitis, concerning for osteomyelitis-patient was started on empiric antibiotics with vancomycin and cefepime, cultures were sent.  Podiatry consulted, appreciate input, she will be taken to the OR today for ray amputation  Active Problems Peripheral arterial disease-she just recently had angioplasty few weeks ago.  Repeat ABI pending  DM2-poorly controlled, with hyperglycemia most recent A1c 8.1.  Continue Lantus, sliding scale.  Continue gabapentin for neuropathy.  CBGs well controlled  CBG (last 3)  Recent Labs    03/01/21 2035 03/02/21 0006 03/02/21 0444  GLUCAP 172* 227* 139*    Paroxysmal A. fib, NSVT-currently on amiodarone, dual antiplatelet therapy which is now on hold.  Not sure the context of her prior A. fib episode but currently not on anticoagulation.  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 amiodarone  200 mg Oral Daily  . atorvastatin  10 mg Oral Daily  . calcitRIOL  0.5 mcg Oral q morning  . carvedilol  3.125 mg Oral q morning  . DULoxetine  30 mg Oral QHS  . ferrous sulfate  325 mg Oral q morning  . gabapentin  600 mg Oral QHS  . insulin aspart  0-9 Units Subcutaneous Q4H  . insulin glargine  22 Units Subcutaneous Q2200  . isosorbide mononitrate  30 mg Oral Daily  . levothyroxine  50 mcg Oral Q0600  . multivitamin with minerals  1 tablet Oral Daily  . Ensure Max Protein  11 oz Oral Daily   Continuous Infusions: . ceFEPime (MAXIPIME) IV 2 g (03/02/21 0318)  . vancomycin 2,000 mg (03/01/21 0404)   PRN Meds:.morphine injection, nitroGLYCERIN  Diet Orders (From admission, onward)    Start     Ordered   02/27/21 1554  Diet Carb Modified Fluid consistency: Thin; Room service appropriate? Yes  Diet effective now       Question Answer Comment  Diet-HS Snack? Nothing   Calorie Level Medium 1600-2000   Fluid consistency: Thin   Room service appropriate? Yes      02/27/21 1553  DVT prophylaxis: SCDs Start: 02/27/21 0331     Code Status: Full Code  Family Communication: No family present at bedside  Status is: Inpatient  Remains inpatient appropriate because:Inpatient level of care appropriate due to severity of illness   Dispo: The patient is from: Home              Anticipated d/c is to: Home              Patient currently is not medically stable to d/c.    Difficult to place patient No   Level of care: Telemetry Medical  Consultants:  Podiatry  Procedures:  None   Microbiology  Blood cultures 5/23-no growth  Antimicrobials: Vancomycin 5/23 >> Cefepime 5/23 >>   Objective: Vitals:   03/01/21 1225 03/01/21 1817 03/02/21 0009 03/02/21 0445  BP: (!) 168/61 (!) 151/59 (!) 153/61 (!) 151/62  Pulse: 64 61 74 63  Resp: 16 16 18 18   Temp: 98 F (36.7 C) 98.2 F (36.8 C) 98.3 F (36.8 C) 97.7 F (36.5 C)  TempSrc: Oral Oral Oral Oral  SpO2: 100% 100% 100% 98%  Weight:      Height:        Intake/Output Summary (Last 24 hours) at 03/02/2021 1045 Last data filed at 03/02/2021 0448 Gross per 24 hour  Intake 550 ml  Output 600 ml  Net -50 ml   Filed Weights   02/27/21 0100 02/27/21 1729  Weight: 130 kg 130 kg    Examination:  Constitutional: NAD Eyes: No icterus ENMT: mmm Neck: normal, supple Respiratory: Clear bilaterally, no wheezing or crackles Cardiovascular: Regular rate and rhythm, no murmurs Abdomen: Nondistended, bowel sounds positive Musculoskeletal: no clubbing / cyanosis.  Skin: No new rashes Neurologic: No focal deficits   Data Reviewed: I have independently reviewed following labs and imaging studies   CBC: Recent Labs  Lab 02/26/21 2000 02/27/21 1917 03/01/21 0309 03/02/21 0235  WBC 7.3 5.5 5.7 6.4  NEUTROABS 4.5  --   --   --   HGB 11.5* 10.7* 10.7* 11.5*  HCT 36.3 33.7* 32.5* 34.9*  MCV 96.3 95.2 92.9 91.8  PLT 184 149* 148* 076   Basic Metabolic Panel: Recent Labs  Lab 02/26/21 2000 02/27/21 1917 03/01/21 0309 03/02/21 0235  NA 138 139 140 138  K 4.8 4.9 3.8 3.9  CL 103 106 108 107  CO2 25 25 24 23   GLUCOSE 154* 190* 119* 234*  BUN 31* 29* 27* 28*  CREATININE 1.80* 1.82* 1.49* 1.35*  CALCIUM 9.6 8.8* 8.9 9.1   Liver Function Tests: Recent Labs  Lab 02/26/21 2000  AST 17  ALT 15  ALKPHOS 64  BILITOT 0.7  PROT 7.2  ALBUMIN 3.5   Coagulation Profile: No results for  input(s): INR, PROTIME in the last 168 hours. HbA1C: No results for input(s): HGBA1C in the last 72 hours. CBG: Recent Labs  Lab 03/01/21 1110 03/01/21 1612 03/01/21 2035 03/02/21 0006 03/02/21 0444  GLUCAP 156* 194* 172* 227* 139*    Recent Results (from the past 240 hour(s))  Culture, blood (routine x 2)     Status: None (Preliminary result)   Collection Time: 02/26/21  7:00 PM   Specimen: BLOOD RIGHT ARM  Result Value Ref Range Status   Specimen Description BLOOD RIGHT ARM  Final   Special Requests   Final    BOTTLES DRAWN AEROBIC AND ANAEROBIC Blood Culture adequate volume   Culture   Final    NO GROWTH 4 DAYS Performed  at [facility], [place] [address], [place], [place] [address]    Report Status PENDING  Incomplete  Culture, blood (routine x 2)     Status: None (Preliminary result)   Collection Time: 02/26/21  7:15 PM   Specimen: BLOOD LEFT ARM  Result Value Ref Range Status   Specimen Description BLOOD LEFT ARM  Final   Special Requests   Final    BOTTLES DRAWN AEROBIC ONLY Blood Culture results may not be optimal due to an excessive volume of blood received in culture bottles   Culture   Final    NO GROWTH 4 DAYS Performed at [facility], [place] [address] [place], [name] [address]    Report Status PENDING  Incomplete  Resp Panel by RT-PCR (Flu A&B, Covid) Nasopharyngeal Swab     Status: None   Collection Time: 02/27/21  3:22 AM   Specimen: Nasopharyngeal Swab; Nasopharyngeal(NP) swabs in vial transport medium  Result Value Ref Range Status   SARS Coronavirus 2 by RT PCR NEGATIVE NEGATIVE Final    Comment: (NOTE) SARS-CoV-2 target nucleic acids are NOT DETECTED.  The SARS-CoV-2 RNA is generally detectable in upper respiratory specimens during the acute phase of infection. The lowest concentration of SARS-CoV-2 viral copies this assay can detect is 138 copies/mL. A negative result does not preclude SARS-Cov-2 infection and should not be used as the  sole basis for treatment or other patient management decisions. A negative result may occur with  improper specimen collection/handling, submission of specimen other than nasopharyngeal swab, presence of viral mutation(s) within the areas targeted by this assay, and inadequate number of viral copies(<138 copies/mL). A negative result must be combined with clinical observations, patient history, and epidemiological information. The expected result is Negative.  Fact Sheet for Patients:  EntrepreneurPulse.com.au  Fact Sheet for Healthcare Providers:  IncredibleEmployment.be  This test is no t yet approved or cleared by the [place] FDA and  has been authorized for detection and/or diagnosis of SARS-CoV-2 by FDA under an Emergency Use Authorization (EUA). This EUA will remain  in effect (meaning this test can be used) for the duration of the COVID-19 declaration under Section 564(b)(1) of the Act, 21 U.S.C.section 360bbb-3(b)(1), unless the authorization is terminated  or revoked sooner.       Influenza A by PCR NEGATIVE NEGATIVE Final   Influenza B by PCR NEGATIVE NEGATIVE Final    Comment: (NOTE) The Xpert Xpress SARS-CoV-2/FLU/RSV plus assay is intended as an aid in the diagnosis of influenza from Nasopharyngeal swab specimens and should not be used as a sole basis for treatment. Nasal washings and aspirates are unacceptable for Xpert Xpress SARS-CoV-2/FLU/RSV testing.  Fact Sheet for Patients: EntrepreneurPulse.com.au  Fact Sheet for Healthcare Providers: IncredibleEmployment.be  This test is not yet approved or cleared by the [place] FDA and has been authorized for detection and/or diagnosis of SARS-CoV-2 by FDA under an Emergency Use Authorization (EUA). This EUA will remain in effect (meaning this test can be used) for the duration of the COVID-19 declaration under Section 564(b)(1) of the  Act, 21 U.S.C. section 360bbb-3(b)(1), unless the authorization is terminated or revoked.  Performed at [facility], [place] [address], [place], [place] [address]   MRSA PCR Screening     Status: None   Collection Time: 03/01/21  9:00 PM   Specimen: Nasopharyngeal  Result Value Ref Range Status   MRSA by PCR NEGATIVE NEGATIVE Final    Comment:  The GeneXpert MRSA Assay (FDA approved for NASAL specimens only), is one component of a comprehensive MRSA colonization surveillance program. It is not intended to diagnose MRSA infection nor to guide or monitor treatment for MRSA infections. Performed at [facility], [place] [address] [place], [place] [address]      Radiology Studies: VAS [place] ABI WITH/WO TBI  Result Date: 03/02/2021  LOWER EXTREMITY DOPPLER STUDY Patient Name:  [name]  Date of Exam:   03/02/2021 Medical Rec #: [id number]          Accession #:    [id number] Date of Birth: 10/03/1956           Patient Gender: F Patient Age:   65Y Exam Location:  [facility] Procedure:      VAS [place] ABI WITH/WO TBI Referring Phys: Letcher --------------------------------------------------------------------------------  Indications: Ulceration, and peripheral artery disease. High Risk Factors: Hypertension, hyperlipidemia, Diabetes. Other Factors: Left foot osteomyelitis with last month status post bone                resection of the 5th proximal phalanx and metatarsal and                sesamoidectomy on 02/03/2021.  Vascular Interventions: Angioplasty left SFA stent on 02/02/21. Know                         narrowing/stenosis in left popliteal stent. Limitations: Today's exam was limited due to an open wound, bandages and Patient              could not tolerate the BP cuff pressure on the left side to obtain              ABI. Performing Technologist: Oda Cogan RDMS, RVT  Examination Guidelines: A complete evaluation includes at minimum, Doppler waveform  signals and systolic blood pressure reading at the level of bilateral brachial, anterior tibial, and posterior tibial arteries, when vessel segments are accessible. Bilateral testing is considered an integral part of a complete examination. Photoelectric Plethysmograph (PPG) waveforms and toe systolic pressure readings are included as required and additional duplex testing as needed. Limited examinations for reoccurring indications may be performed as noted.  ABI Findings: +---------+------------------+-----+---------+--------+ Right    Rt Pressure (mmHg)IndexWaveform Comment  +---------+------------------+-----+---------+--------+ Brachial 174                    triphasic         +---------+------------------+-----+---------+--------+ PTA      195               1.12 triphasic         +---------+------------------+-----+---------+--------+ DP       204               1.17 triphasic         +---------+------------------+-----+---------+--------+ Great Toe158               0.91                   +---------+------------------+-----+---------+--------+ +---------+------------------+-----+----------+---------------------+ Left     Lt Pressure (mmHg)IndexWaveform  Comment               +---------+------------------+-----+----------+---------------------+ Brachial 165                    triphasic                       +---------+------------------+-----+----------+---------------------+  PTA                             monophasicsee limitations above +---------+------------------+-----+----------+---------------------+ DP                              monophasicsee limitations above +---------+------------------+-----+----------+---------------------+ Great Toe90                0.52                                 +---------+------------------+-----+----------+---------------------+ +-------+--------------+-----------+------------+------------+ ABI/TBIToday's ABI    Today's TBIPrevious ABIPrevious TBI +-------+--------------+-----------+------------+------------+ Right  1.1           0.91       1.2         0.68         +-------+--------------+-----------+------------+------------+ Left   not obtainable0.52       0.85        0.14         +-------+--------------+-----------+------------+------------+  Right ABIs appear essentially unchanged compared to prior study on 01/29/2021.  Summary: Right: Resting right ankle-brachial index is within normal range. No evidence of significant right lower extremity arterial disease. The right toe-brachial index is normal. Left: The left toe-brachial index is abnormal. Left ABI was not obtainable due to patient inability to tolerate BP cuff pressure.  *See table(s) above for measurements and observations.     Preliminary     Marzetta Board, MD, PhD [facility]  Between 7 am - 7 pm I am available, please contact me via Amion (for emergencies) or Securechat (non urgent messages)  Between 7 pm - 7 am I am not available, please contact night coverage MD/APP via Amion

## 2021-03-02 NOTE — Anesthesia Preprocedure Evaluation (Addendum)
Anesthesia Evaluation  Patient identified by MRN, date of birth, ID band Patient awake    Reviewed: Allergy & Precautions, NPO status , Patient's Chart, lab work & pertinent test results, reviewed documented beta blocker date and time   History of Anesthesia Complications Negative for: history of anesthetic complications  Airway Mallampati: I  TM Distance: >3 FB Neck ROM: Full    Dental  (+) Caps, Dental Advisory Given   Pulmonary sleep apnea and Continuous Positive Airway Pressure Ventilation ,    breath sounds clear to auscultation       Cardiovascular hypertension, Pt. on medications and Pt. on home beta blockers (-) angina+ CAD, + Cardiac Stents and +CHF  + dysrhythmias Atrial Fibrillation + pacemaker + Cardiac Defibrillator (device has never shocked patient)  Rhythm:Regular Rate:Normal  '21 ECHO: EF 50-55%. The LV has low normal function, no regional wall motion abnormalities. Grade I diastolic  dysfunction (impaired relaxation). RV systolic function is low normal. Mild-moderate MR.    Neuro/Psych Depression Diabetic peripheral neuropathy    GI/Hepatic negative GI ROS, Neg liver ROS,   Endo/Other  diabetes (glu 101), Insulin DependentHypothyroidism Morbid obesity  Renal/GU Renal InsufficiencyRenal disease (creat 1.35)     Musculoskeletal   Abdominal (+) + obese,   Peds  Hematology negative hematology ROS (+)   Anesthesia Other Findings   Reproductive/Obstetrics                            Anesthesia Physical Anesthesia Plan  ASA: III  Anesthesia Plan: MAC   Post-op Pain Management:    Induction: Intravenous  PONV Risk Score and Plan: 2 and Ondansetron, Dexamethasone and Treatment may vary due to age or medical condition  Airway Management Planned: Natural Airway and Simple Face Mask  Additional Equipment: None  Intra-op Plan:   Post-operative Plan:   Informed Consent: I  have reviewed the patients History and Physical, chart, labs and discussed the procedure including the risks, benefits and alternatives for the proposed anesthesia with the patient or authorized representative who has indicated his/her understanding and acceptance.     Dental advisory given  Plan Discussed with: CRNA and Surgeon  Anesthesia Plan Comments:        Anesthesia Quick Evaluation

## 2021-03-02 NOTE — Progress Notes (Signed)
Patient seen in pre-op. 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 We discussed limb salvage versus amputation and she is in agreement to go ahead and proceed with amputation as this is her 3rd hospitalization for recurring infection in the toe. Discussed the surgery and post-op course. Discussed risks of surgery and she agrees. Consent signed.   Recommend vascular consult as well.   Will discuss with patients mom post-op at patients request.  [phone] [facility]

## 2021-03-02 NOTE — Progress Notes (Addendum)
Pharmacy Antibiotic Note  [name] is a 65 y.o. female admitted on 02/26/2021 with post-op wound infection - L foot.  Pharmacy has been consulted for vancomycin and cefepime dosing. Patient has been receiving vancomycin 2000mg  q48h and cefepime 2g q12h. Renal function improving. Podiatry planning partial 1st ray amputation today.   Plan: Continue vancomycin 2000mg  IV q48h (eAUC 402 using Scr 1.35, Vd 0.5) Continue cefepime 2g q12h  Follow up length of therapy, vancomycin levels as indication, cultures, and clinical progression   Height: 5\' 8"  (172.7 cm) Weight: 130 kg (286 lb 9.6 oz) IBW/kg (Calculated) : 63.9  Temp (24hrs), Avg:98.1 F (36.7 C), Min:97.7 F (36.5 C), Max:98.5 F (36.9 C)  Recent Labs  Lab 02/26/21 2000 02/27/21 1917 03/01/21 0309 03/02/21 0235  WBC 7.3 5.5 5.7 6.4  CREATININE 1.80* 1.82* 1.49* 1.35*    Estimated Creatinine Clearance: 57.6 mL/min (A) (by C-G formula based on SCr of 1.35 mg/dL (H)).    Allergies  Allergen Reactions  . Nsaids     Kidney disease    Antimicrobials this admission: 5/24 Vanc >>  5/24 cefepime >> 5/24 Zosyn x 1  Microbiology results: 5/23 BCx: ngtd  Thank you for allowing pharmacy to be a part of this patient's care.  Cristela Felt, PharmD Clinical Pharmacist  03/02/2021 12:09 PM

## 2021-03-02 NOTE — Transfer of Care (Signed)
Immediate Anesthesia Transfer of Care Note  Patient: [name]  Procedure(s) Performed: AMPUTATION RAY, left great toe (Left Toe)  Patient Location: PACU  Anesthesia Type:MAC combined with regional for post-op pain  Level of Consciousness: awake, alert  and oriented  Airway & Oxygen Therapy: Patient Spontanous Breathing  Post-op Assessment: Report given to RN and Post -op Vital signs reviewed and stable  Post vital signs: Reviewed and stable  Last Vitals:  Vitals Value Taken Time  BP    Temp    Pulse 60 03/02/21 1458  Resp 16 03/02/21 1458  SpO2 100 % 03/02/21 1458  Vitals shown include unvalidated device data.  Last Pain:  Vitals:   03/02/21 1228  TempSrc: Oral  PainSc:          Complications: No complications documented.

## 2021-03-02 NOTE — Brief Op Note (Signed)
03/02/2021  3:20 PM  PATIENT:  [name]  65 y.o. female  PRE-OPERATIVE DIAGNOSIS:  Osteomyeltiis  POST-OPERATIVE DIAGNOSIS:  Osteomyeltiis  PROCEDURE:  Procedure(s): AMPUTATION RAY, left great toe (Left)  SURGEON:  Surgeon(s) and Role:    * Trula Slade, DPM - Primary  PHYSICIAN ASSISTANT:   ASSISTANTS: none   ANESTHESIA:   general  EBL:  50 mL   BLOOD ADMINISTERED:none  DRAINS: none   LOCAL MEDICATIONS USED:  Amount: 20 ml lidocaine and marcaine plain  SPECIMEN:  Source of Specimen:  bone for pathology, bone culture clean margin   DISPOSITION OF SPECIMEN:  PATHOLOGY  COUNTS:  YES  TOURNIQUET:  * No tourniquets in log *  DICTATION: .Dragon Dictation  PLAN OF CARE: Admit to inpatient   PATIENT DISPOSITION:  PACU - hemodynamically stable.   Delay start of Pharmacological VTE agent (>24hrs) due to surgical blood loss or risk of bleeding: no

## 2021-03-03 DIAGNOSIS — I10 Essential (primary) hypertension: Secondary | ICD-10-CM | POA: Diagnosis not present

## 2021-03-03 DIAGNOSIS — E1129 Type 2 diabetes mellitus with other diabetic kidney complication: Secondary | ICD-10-CM | POA: Diagnosis not present

## 2021-03-03 DIAGNOSIS — L03116 Cellulitis of left lower limb: Secondary | ICD-10-CM | POA: Diagnosis not present

## 2021-03-03 DIAGNOSIS — N184 Chronic kidney disease, stage 4 (severe): Secondary | ICD-10-CM | POA: Diagnosis not present

## 2021-03-03 LAB — COMPREHENSIVE METABOLIC PANEL
ALT: 14 U/L (ref 0–44)
AST: 16 U/L (ref 15–41)
Albumin: 3 g/dL — ABNORMAL LOW (ref 3.5–5.0)
Alkaline Phosphatase: 55 U/L (ref 38–126)
Anion gap: 6 (ref 5–15)
BUN: 30 mg/dL — ABNORMAL HIGH (ref 8–23)
CO2: 21 mmol/L — ABNORMAL LOW (ref 22–32)
Calcium: 8.7 mg/dL — ABNORMAL LOW (ref 8.9–10.3)
Chloride: 108 mmol/L (ref 98–111)
Creatinine, Ser: 1.39 mg/dL — ABNORMAL HIGH (ref 0.44–1.00)
GFR, Estimated: 42 mL/min — ABNORMAL LOW (ref >60)
Glucose, Bld: 292 mg/dL — ABNORMAL HIGH (ref 70–99)
Potassium: 5 mmol/L (ref 3.5–5.1)
Sodium: 135 mmol/L (ref 135–145)
Total Bilirubin: 0.4 mg/dL (ref 0.3–1.2)
Total Protein: 6.1 g/dL — ABNORMAL LOW (ref 6.5–8.1)

## 2021-03-03 LAB — CBC
HCT: 32 % — ABNORMAL LOW (ref 36.0–46.0)
Hemoglobin: 10.6 g/dL — ABNORMAL LOW (ref 12.0–15.0)
MCH: 30.6 pg (ref 26.0–34.0)
MCHC: 33.1 g/dL (ref 30.0–36.0)
MCV: 92.5 fL (ref 80.0–100.0)
Platelets: 157 10*3/uL (ref 150–400)
RBC: 3.46 MIL/uL — ABNORMAL LOW (ref 3.87–5.11)
RDW: 13.2 % (ref 11.5–15.5)
WBC: 6.8 10*3/uL (ref 4.0–10.5)
nRBC: 0 % (ref 0.0–0.2)

## 2021-03-03 LAB — GLUCOSE, CAPILLARY
Glucose-Capillary: 221 mg/dL — ABNORMAL HIGH (ref 70–99)
Glucose-Capillary: 256 mg/dL — ABNORMAL HIGH (ref 70–99)
Glucose-Capillary: 261 mg/dL — ABNORMAL HIGH (ref 70–99)
Glucose-Capillary: 273 mg/dL — ABNORMAL HIGH (ref 70–99)
Glucose-Capillary: 281 mg/dL — ABNORMAL HIGH (ref 70–99)

## 2021-03-03 LAB — CULTURE, BLOOD (ROUTINE X 2)
Culture: NO GROWTH
Culture: NO GROWTH
Special Requests: ADEQUATE

## 2021-03-03 NOTE — Progress Notes (Signed)
Subjective: Postop day #1 status post left partial first amputation.  States that she is doing well without any concerns.  Denies any fevers, chills, nausea, vomiting.  No pain currently.  Objective: AAO x3, NAD DP/PT pulses palpable bilaterally, CRT less than 3 seconds Dressing clean, dry, intact without any strikethrough.   No pain with calf compression, bandage intact all sides of the leg from the old hematoma, abrasion.  Assessment: Postop day #1 status post left partial first amputation  Plan: Doing well this morning.  White blood cell count is 6.8, hemoglobin 10.6.  Bone culture still pending.  She is weightbearing to the heel in a cam boot.  Previously tried a Agricultural consultant but did not tolerate this.  Encouraged elevation.  We will plan on changing the dressing tomorrow.  Awaiting bone culture results before discharged for appropriate antibiotics.  Celesta Gentile, DPM

## 2021-03-03 NOTE — Evaluation (Signed)
Physical Therapy Evaluation Patient Details Name: ZOELLE VIELMA MRN: [id number] DOB: Feb 01, 1956 Today's Date: 03/03/2021   History of Present Illness  The pt is a 65 yo female presenting 4/24 due to non-healing wound on L foot after 3 weeks of antibiotics. Pt found to have dampened signals in the L foot and underwent arteriography with stant placement in superficial femoral artery on 4/29. I&D of L foot wound on 4/30. PMH includes: MI, osteomyelitis of foot and ankle, CAD, CKD, CHF, HTN, neuropathy, obesity, DM II, multiple toe amputations, and heart caths, and biventricular cardioverter defibrillator implantation.  Clinical Impression  Pt admitted with/for L foot great toe ray amputation due to osteo.  Pt mobilizing at a supervision level, but asked to keep mobility to a limited level and w/bearing biased toward L heel..  Pt currently limited functionally due to the problems listed below.  (see problems list.)  Pt will benefit from PT to maximize function and safety to be able to get home safely with available assist.     Follow Up Recommendations No PT follow up;Supervision - Intermittent    Equipment Recommendations  None recommended by PT    Recommendations for Other Services       Precautions / Restrictions Precautions Precautions: Fall Required Braces or Orthoses: Other Brace (cam walker shoe.) Restrictions LLE Weight Bearing: Partial weight bearing      Mobility  Bed Mobility               General bed mobility comments: OOB in the chair on arrival    Transfers Overall transfer level: Needs assistance Equipment used: Rolling walker (2 wheeled) Transfers: Sit to/from Stand Sit to Stand: Supervision         General transfer comment: appropriate use of UE's, no assist, generally maintains pwb onto the heel.  Ambulation/Gait Ambulation/Gait assistance: Supervision Gait Distance (Feet): 50 Feet Assistive device: Rolling walker (2 wheeled) Gait  Pattern/deviations: Step-to pattern   Gait velocity interpretation: <1.8 ft/sec, indicate of risk for recurrent falls General Gait Details: stable step to pattern with L heel bias and weight through UE's during L w/bearing. To/from EOB<>step for practicing  Stairs            Wheelchair Mobility    Modified Rankin (Stroke Patients Only)       Balance     Sitting balance-Leahy Scale: Good       Standing balance-Leahy Scale: Poor Standing balance comment: reliant on BUE support due to [facility] restriction                             Pertinent Vitals/Pain Pain Assessment: Faces Faces Pain Scale: No hurt Pain Intervention(s): Monitored during session    Home Living Family/patient expects to be discharged to:: Private residence Living Arrangements: Parent Available Help at Discharge: Family;Available 24 hours/day Type of Home: House Home Access: Stairs to enter Entrance Stairs-Rails: Lawyer of Steps: 3 Home Layout: One level Home Equipment: Walker - 2 wheels;Cane - single point;Bedside commode;Shower seat;Wheelchair - manual      Prior Function Level of Independence: Independent with assistive device(s)         Comments: drives, retired     Higher education careers adviser   Dominant Hand: Right    Extremity/Trunk Assessment   Upper Extremity Assessment Upper Extremity Assessment: Overall WFL for tasks assessed    Lower Extremity Assessment Lower Extremity Assessment: Overall WFL for tasks assessed;LLE deficits/detail LLE Deficits / Details: drop  foot, grossly >4/5 LLE Sensation: history of peripheral neuropathy       Communication   Communication: No difficulties  Cognition Arousal/Alertness: Awake/alert Behavior During Therapy: WFL for tasks assessed/performed Overall Cognitive Status: Within Functional Limits for tasks assessed                                        General Comments General comments (skin  integrity, edema, etc.): Discussed progression of activity, conservative nature of w/bearing through heel.  Doing activity needed and then foot propped.    Exercises     Assessment/Plan    PT Assessment Patient needs continued PT services  PT Problem List Decreased activity tolerance;Decreased balance;Decreased mobility;Decreased knowledge of precautions       PT Treatment Interventions DME instruction;Gait training;Stair training;Functional mobility training;Therapeutic activities;Patient/family education    PT Goals (Current goals can be found in the Care Plan section)  Acute Rehab PT Goals Patient Stated Goal: maintain the health/status of this foot. PT Goal Formulation: With patient Time For Goal Achievement: 03/16/21 Potential to Achieve Goals: Good    Frequency Min 3X/week   Barriers to discharge        Co-evaluation               AM-PAC PT "6 Clicks" Mobility  Outcome Measure Help needed turning from your back to your side while in a flat bed without using bedrails?: None Help needed moving from lying on your back to sitting on the side of a flat bed without using bedrails?: None Help needed moving to and from a bed to a chair (including a wheelchair)?: A Little Help needed standing up from a chair using your arms (e.g., wheelchair or bedside chair)?: A Little Help needed to walk in hospital room?: A Little Help needed climbing 3-5 steps with a railing? : A Little 6 Click Score: 20    End of Session   Activity Tolerance: Patient tolerated treatment well Patient left: in chair;with call bell/phone within reach Nurse Communication: Mobility status PT Visit Diagnosis: Other abnormalities of gait and mobility (R26.89);Difficulty in walking, not elsewhere classified (R26.2)    Time: 7425-9563 PT Time Calculation (min) (ACUTE ONLY): 30 min   Charges:   PT Evaluation $PT Eval Moderate Complexity: 1 Mod PT Treatments $Gait Training: 8-22 mins         03/03/2021  Jacinto Halim., PT Acute Rehabilitation Services [phone]  (pager) [phone]  (office)  Eliseo Gum Zebulun Deman 03/03/2021, 5:33 PM

## 2021-03-03 NOTE — Progress Notes (Signed)
PROGRESS NOTE  [name] NTZ:[id number] DOB: September 05, 1956 DOA: 02/26/2021 PCP: [name], MD   LOS: 4 days   Brief Narrative / Interim history: 65 year old female with nonischemic cardiomyopathy/AICD in place, A. fib, DM 2, OSA, hypothyroidism, chronic kidney disease stage IV who recently had left foot osteomyelitis and was admitted last month status post bone resection of the fifth proximal phalanx and metatarsal and sesamoidectomy was discharged home on May 3.  At that time vascular was also consulted and she was status post angioplasty of 3 separate in-stent stenosis in the left superficial femoral artery.  Intraoperative cultures grew Enterococcus and staph aureus and capitis, all pansensitive, per ID she was narrowed to Augmentin.  She has had worsening discharge from her foot and came back to the hospital from her podiatrist office.  Podiatry consulted  Subjective / 24h Interval events: Denies any pain.  Doing well.  No chest discomfort, shortness of breath, no abdominal pain, no nausea or vomiting.  Last bowel movement 2 days ago but that is normal for  Assessment & Plan: Principal Problem Left foot cellulitis, concerning for osteomyelitis-patient was started on empiric antibiotics with vancomycin and cefepime, cultures were sent.  Podiatry consulted, she is status post ray amputation 5/27, tissue cultures sent intraoperatively, monitor microbiology and narrow accordingly  Active Problems Peripheral arterial disease-she just recently had angioplasty few weeks ago.  Repeat arterial studies 5/27 showed a 50-9 9% stenosis on the distal left popliteal stent segment.  Vascular surgery consulted, no interventions for now, appreciate input.  DM2-poorly controlled, with hyperglycemia most recent A1c 8.1.  Continue Lantus, sliding scale.  Continue gabapentin for neuropathy.  CBGs stable  CBG (last 3)  Recent Labs    03/02/21 2106 03/02/21 2358 03/03/21 0356  GLUCAP 273* 221* 256*     Paroxysmal A. fib, NSVT-currently on amiodarone, dual antiplatelet therapy which is now on hold.  Not sure the context of her prior A. fib episode but currently not on anticoagulation.  No active issues right now.  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 amiodarone  200 mg Oral Daily  . atorvastatin  10 mg Oral Daily  . calcitRIOL  0.5 mcg Oral q morning  . carvedilol  3.125 mg Oral q morning  . DULoxetine  30 mg Oral QHS  . ferrous sulfate  325 mg Oral q morning  . gabapentin  600 mg Oral QHS  . insulin aspart  0-9 Units Subcutaneous Q4H  . insulin glargine  22 Units Subcutaneous Q2200  . isosorbide mononitrate  30 mg Oral Daily  . levothyroxine  50 mcg Oral Q0600  . multivitamin with minerals  1 tablet Oral Daily  . Ensure Max Protein  11 oz Oral Daily   Continuous Infusions: . ceFEPime (MAXIPIME) IV 2 g (03/03/21 0349)  . vancomycin 2,000 mg (03/03/21 0518)   PRN Meds:.morphine injection, nitroGLYCERIN  Diet Orders (From admission, onward)    Start     Ordered   02/27/21 1554  Diet Carb Modified Fluid consistency: Thin; Room service appropriate? Yes  Diet effective now       Question Answer Comment  Diet-HS Snack? Nothing   Calorie Level Medium 1600-2000   Fluid consistency: Thin   Room service appropriate? Yes      02/27/21 1553          DVT prophylaxis: SCDs Start: 02/27/21 0331     Code Status: Full Code  Family  Communication: No family present at bedside  Status is: Inpatient  Remains inpatient appropriate because:Inpatient level of care appropriate due to severity of illness   Dispo: The patient is from: Home              Anticipated d/c is to: Home              Patient currently is not medically stable to d/c.   Difficult to place patient No   Level of care: Telemetry Medical  Consultants:  Podiatry  Procedures:  None   Microbiology  Blood cultures 5/23-no growth  Antimicrobials: Vancomycin 5/23 >> Cefepime 5/23 >>   Objective: Vitals:   03/02/21 1513 03/02/21 1540 03/02/21 2322 03/03/21 0442  BP: (!) 114/53 (!) 123/53 132/62 (!) 139/58  Pulse: (!) 58 (!) 56 61 62  Resp: (!) 22 19 17 18   Temp:  97.9 F (36.6 C) 98.4 F (36.9 C) 98.5 F (36.9 C)  TempSrc:  Oral Oral Oral  SpO2: 98% 100% 99% 98%  Weight:      Height:        Intake/Output Summary (Last 24 hours) at 03/03/2021 1025 Last data filed at 03/03/2021 0444 Gross per 24 hour  Intake 880 ml  Output 50 ml  Net 830 ml   Filed Weights   02/27/21 0100 02/27/21 1729 03/02/21 1228  Weight: 130 kg 130 kg 130 kg    Examination:  Constitutional: She is in no distress Eyes: No scleral icterus ENMT: mmm Neck: normal, supple Respiratory: Lungs are clear bilaterally, no wheezing, no crackles Cardiovascular: Regular rate and rhythm, no murmurs Abdomen: Soft, NT, ND, bowel sounds positive Musculoskeletal: no clubbing / cyanosis.  Skin: No rashes seen Neurologic: Nonfocal   Data Reviewed: I have independently reviewed following labs and imaging studies   CBC: Recent Labs  Lab 02/26/21 2000 02/27/21 1917 03/01/21 0309 03/02/21 0235 03/03/21 0131  WBC 7.3 5.5 5.7 6.4 6.8  NEUTROABS 4.5  --   --   --   --   HGB 11.5* 10.7* 10.7* 11.5* 10.6*  HCT 36.3 33.7* 32.5* 34.9* 32.0*  MCV 96.3 95.2 92.9 91.8 92.5  PLT 184 149* 148* 152 353   Basic Metabolic Panel: Recent Labs  Lab 02/26/21 2000  02/27/21 1917 03/01/21 0309 03/02/21 0235 03/03/21 0131  NA 138 139 140 138 135  K 4.8 4.9 3.8 3.9 5.0  CL 103 106 108 107 108  CO2 25 25 24 23  21*  GLUCOSE 154* 190* 119* 234* 292*  BUN 31* 29* 27* 28* 30*  CREATININE 1.80* 1.82* 1.49* 1.35* 1.39*  CALCIUM 9.6 8.8* 8.9 9.1 8.7*   Liver Function Tests: Recent Labs  Lab 02/26/21 2000 03/03/21 0131  AST 17 16  ALT 15 14  ALKPHOS 64 55  BILITOT 0.7 0.4  PROT 7.2 6.1*  ALBUMIN 3.5 3.0*   Coagulation Profile: No results for input(s): INR, PROTIME in the last 168 hours. HbA1C: No results for input(s): HGBA1C in the last 72 hours. CBG: Recent Labs  Lab 03/02/21 1459 03/02/21 1616 03/02/21 2106 03/02/21 2358 03/03/21 0356  GLUCAP 126* 170* 273* 221*  256*    Recent Results (from the past 240 hour(s))  Culture, blood (routine x 2)     Status: None   Collection Time: 02/26/21  7:00 PM   Specimen: BLOOD RIGHT ARM  Result Value Ref Range Status   Specimen Description BLOOD RIGHT ARM  Final   Special Requests   Final    BOTTLES DRAWN AEROBIC AND ANAEROBIC Blood Culture adequate volume   Culture   Final    NO GROWTH 5 DAYS Performed at [facility], [address] [place], [place] [address]    Report Status 03/03/2021 FINAL  Final  Culture, blood (routine x 2)     Status: None   Collection Time: 02/26/21  7:15 PM   Specimen: BLOOD LEFT ARM  Result Value Ref Range Status   Specimen Description BLOOD LEFT ARM  Final   Special Requests   Final    BOTTLES DRAWN AEROBIC ONLY Blood Culture results may not be optimal due to an excessive volume of blood received in culture bottles   Culture   Final    NO GROWTH 5 DAYS Performed at [facility], [place] [address] [place], [place] [address]    Report Status 03/03/2021 FINAL  Final  Resp Panel by RT-PCR (Flu A&B, Covid) Nasopharyngeal Swab     Status: None   Collection Time: 02/27/21  3:22 AM   Specimen: Nasopharyngeal Swab; Nasopharyngeal(NP) swabs in vial  transport medium  Result Value Ref Range Status   SARS Coronavirus 2 by RT PCR NEGATIVE NEGATIVE Final    Comment: (NOTE) SARS-CoV-2 target nucleic acids are NOT DETECTED.  The SARS-CoV-2 RNA is generally detectable in upper respiratory specimens during the acute phase of infection. The lowest concentration of SARS-CoV-2 viral copies this assay can detect is 138 copies/mL. A negative result does not preclude SARS-Cov-2 infection and should not be used as the sole basis for treatment or other patient management decisions. A negative result may occur with  improper specimen collection/handling, submission of specimen other than nasopharyngeal swab, presence of viral mutation(s) within the areas targeted by this assay, and inadequate number of viral copies(<138 copies/mL). A negative result must be combined with clinical observations, patient history, and epidemiological information. The expected result is Negative.  Fact Sheet for Patients:  EntrepreneurPulse.com.au  Fact Sheet for Healthcare Providers:  IncredibleEmployment.be  This test is no t yet approved or cleared by the [place] FDA and  has been authorized for detection and/or diagnosis of SARS-CoV-2 by FDA under an Emergency Use Authorization (EUA). This EUA will remain  in effect (meaning this test can be used) for the duration of the COVID-19 declaration under Section 564(b)(1) of the Act, 21 U.S.C.section 360bbb-3(b)(1), unless the authorization is terminated  or revoked sooner.       Influenza A by PCR NEGATIVE NEGATIVE Final   Influenza B by PCR NEGATIVE NEGATIVE Final    Comment: (NOTE) The Xpert Xpress SARS-CoV-2/FLU/RSV plus assay is intended as an aid in the diagnosis of influenza from Nasopharyngeal swab specimens and should not be used as a sole basis for treatment. Nasal washings and aspirates are unacceptable for Xpert Xpress SARS-CoV-2/FLU/RSV testing.  Fact  Sheet for Patients: EntrepreneurPulse.com.au  Fact Sheet for Healthcare Providers: IncredibleEmployment.be  This test is not yet approved or cleared by the [place] FDA and has been authorized for detection and/or diagnosis of SARS-CoV-2 by FDA under an Emergency Use Authorization (EUA). This EUA will remain in effect (meaning this test can  be used) for the duration of the COVID-19 declaration under Section 564(b)(1) of the Act, 21 U.S.C. section 360bbb-3(b)(1), unless the authorization is terminated or revoked.  Performed at [facility], [place] [address] [place], [place] [address]   MRSA PCR Screening     Status: None   Collection Time: 03/01/21  9:00 PM   Specimen: Nasopharyngeal  Result Value Ref Range Status   MRSA by PCR NEGATIVE NEGATIVE Final    Comment:        The GeneXpert MRSA Assay (FDA approved for NASAL specimens only), is one component of a comprehensive MRSA colonization surveillance program. It is not intended to diagnose MRSA infection nor to guide or monitor treatment for MRSA infections. Performed at [facility], [place] [address] [place], [place] [address]      Radiology Studies: DG Foot Complete Left  Result Date: 03/02/2021 CLINICAL DATA:  Postoperative state. EXAM: LEFT FOOT - COMPLETE 3+ VIEW COMPARISON:  Left radiograph 02/26/2021, left foot CT 02/27/2021 FINDINGS: New postoperative changes of great toe amputation with residual proximal first metatarsal. Adjacent soft tissue swelling. Prior partial amputation of the fifth metatarsal with preserved distal phalanx and partially the middle phalanx, with unchanged small osseous fragments interposed between the bones. Prior second digit amputation. There is no evidence of new osseous erosion or destruction. There is plantar calcaneal spurring and calcification along the plantar fascia. Mild Achilles enthesophyte formation. Moderate midfoot degenerative  arthritis. IMPRESSION: New postoperative changes of first toe amputation with residual proximal first metatarsal. Adjacent soft tissue swelling. Unchanged unchanged appearance of the second toe and fifth metatarsal amputations. No other acute findings. Electronically Signed   By: Maurine Simmering   On: 03/02/2021 18:44    Marzetta Board, MD, PhD [facility]  Between 7 am - 7 pm I am available, please contact me via Amion (for emergencies) or Securechat (non urgent messages)  Between 7 pm - 7 am I am not available, please contact night coverage MD/APP via Amion

## 2021-03-03 NOTE — Progress Notes (Signed)
[facility]  Subjective  -no acute events overnight.  States her dressing will be changed tomorrow by podiatry.   Objective (!) 139/58 62 98.5 F (36.9 C) (Oral) 18 98%  Intake/Output Summary (Last 24 hours) at 03/03/2021 0919 Last data filed at 03/03/2021 0444 Gross per 24 hour  Intake 880 ml  Output 50 ml  Net 830 ml    Left femoral pulse palpable Left PT signal monophasic but brisk by Doppler above her dressing  Laboratory Lab Results: Recent Labs    03/02/21 0235 03/03/21 0131  WBC 6.4 6.8  HGB 11.5* 10.6*  HCT 34.9* 32.0*  PLT 152 157   BMET Recent Labs    03/02/21 0235 03/03/21 0131  NA 138 135  K 3.9 5.0  CL 107 108  CO2 23 21*  GLUCOSE 234* 292*  BUN 28* 30*  CREATININE 1.35* 1.39*  CALCIUM 9.1 8.7*    COAG Lab Results  Component Value Date   INR 1.2 11/28/2020   INR 1.0 10/02/2020   INR 1.1 12/14/2018   No results found for: PTT  Assessment/Planning:  65 year old female who has recently undergone recent left SFA intervention for in-stent restenosis by Dr. Scot Dock with inline flow and two-vessel runoff in the left foot.  Vascular was asked to reengage yesterday given evidence of popliteal stenosis on duplex.  Even with the popliteal stenosis she has inline flow and her toe pressure has improved significantly.  I will let Dr. Scot Dock know of her admission and defer to him if he feels any further intervention is warranted.  Likely will see how this toe wound progresses and certainly if this makes no progress another angiogram may be warranted but again I think she had good results with recent intervention.  Marty Heck 03/03/2021 9:19 AM --

## 2021-03-04 DIAGNOSIS — L03116 Cellulitis of left lower limb: Secondary | ICD-10-CM | POA: Diagnosis not present

## 2021-03-04 DIAGNOSIS — I10 Essential (primary) hypertension: Secondary | ICD-10-CM | POA: Diagnosis not present

## 2021-03-04 DIAGNOSIS — N184 Chronic kidney disease, stage 4 (severe): Secondary | ICD-10-CM | POA: Diagnosis not present

## 2021-03-04 DIAGNOSIS — E1129 Type 2 diabetes mellitus with other diabetic kidney complication: Secondary | ICD-10-CM | POA: Diagnosis not present

## 2021-03-04 LAB — GLUCOSE, CAPILLARY
Glucose-Capillary: 159 mg/dL — ABNORMAL HIGH (ref 70–99)
Glucose-Capillary: 187 mg/dL — ABNORMAL HIGH (ref 70–99)
Glucose-Capillary: 200 mg/dL — ABNORMAL HIGH (ref 70–99)
Glucose-Capillary: 311 mg/dL — ABNORMAL HIGH (ref 70–99)
Glucose-Capillary: 77 mg/dL (ref 70–99)

## 2021-03-04 LAB — CBC
HCT: 29.7 % — ABNORMAL LOW (ref 36.0–46.0)
Hemoglobin: 9.7 g/dL — ABNORMAL LOW (ref 12.0–15.0)
MCH: 30.5 pg (ref 26.0–34.0)
MCHC: 32.7 g/dL (ref 30.0–36.0)
MCV: 93.4 fL (ref 80.0–100.0)
Platelets: 147 10*3/uL — ABNORMAL LOW (ref 150–400)
RBC: 3.18 MIL/uL — ABNORMAL LOW (ref 3.87–5.11)
RDW: 13.6 % (ref 11.5–15.5)
WBC: 8.4 10*3/uL (ref 4.0–10.5)
nRBC: 0 % (ref 0.0–0.2)

## 2021-03-04 LAB — BASIC METABOLIC PANEL
Anion gap: 5 (ref 5–15)
BUN: 46 mg/dL — ABNORMAL HIGH (ref 8–23)
CO2: 23 mmol/L (ref 22–32)
Calcium: 8.6 mg/dL — ABNORMAL LOW (ref 8.9–10.3)
Chloride: 108 mmol/L (ref 98–111)
Creatinine, Ser: 1.98 mg/dL — ABNORMAL HIGH (ref 0.44–1.00)
GFR, Estimated: 27 mL/min — ABNORMAL LOW (ref >60)
Glucose, Bld: 83 mg/dL (ref 70–99)
Potassium: 4.1 mmol/L (ref 3.5–5.1)
Sodium: 136 mmol/L (ref 135–145)

## 2021-03-04 MED ORDER — CLOPIDOGREL BISULFATE 75 MG PO TABS
75.0000 mg | ORAL_TABLET | Freq: Every day | ORAL | Status: DC
  Administered 2021-03-04 – 2021-03-07 (×4): 75 mg via ORAL
  Filled 2021-03-04 (×4): qty 1

## 2021-03-04 MED ORDER — ASPIRIN EC 81 MG PO TBEC
81.0000 mg | DELAYED_RELEASE_TABLET | Freq: Every day | ORAL | Status: DC
  Administered 2021-03-04 – 2021-03-07 (×4): 81 mg via ORAL
  Filled 2021-03-04 (×4): qty 1

## 2021-03-04 NOTE — Progress Notes (Addendum)
Subjective: Postop day #2 status post left partial first amputation.  States that she has been feeling well without any fevers or chills.  No nausea or vomiting.  She has no pain.   Objective: AAO x3, NAD-resting comfortably in bed DP/PT pulses palpable bilaterally, CRT less than 3 seconds Dressing was changed today.  See pictures below.  Incision is well coapted with any surrounding erythema, ascending cellulitis.  There is no fluctuation crepitation.  There is no malodor.  On the dorsal foot/anterior ankle with small erythematous area from irritation but no skin breakdown. Dressing intact with old hematoma site.  This was just changed by nursing.          Assessment: Postop day #2 status post left partial first amputation  Plan: Doing well this morning.  White blood cell count 8.4, afebrile.  Creatinine up some to 1.98 today.  Dressing was changed today.  Nonadherent dressing was applied to the incision followed by dry sterile dressing.  She is weightbearing to the heel.  She is worked with physical therapy.  Awaiting bone culture results from the clean margin.  Once this comes back she can be discharged back to keep her on broad-spectrum antibiotics until this culture comes back.  If it comes back negative will discharge home with 10 days of oral broad-spectrum antibiotics.  Celesta Gentile, DPM

## 2021-03-04 NOTE — Progress Notes (Signed)
[facility]  Subjective  -still waiting for dressing change with podiatry today   Objective (!) 151/59 63 97.6 F (36.4 C) (Oral) 18 96%  Intake/Output Summary (Last 24 hours) at 03/04/2021 0938 Last data filed at 03/04/2021 0630 Gross per 24 hour  Intake --  Output 1100 ml  Net -1100 ml    Left femoral pulse palpable Left PT signal monophasic but brisk by Doppler above her dressing  Laboratory Lab Results: Recent Labs    03/03/21 0131 03/04/21 0311  WBC 6.8 8.4  HGB 10.6* 9.7*  HCT 32.0* 29.7*  PLT 157 147*   BMET Recent Labs    03/03/21 0131 03/04/21 0311  NA 135 136  K 5.0 4.1  CL 108 108  CO2 21* 23  GLUCOSE 292* 83  BUN 30* 46*  CREATININE 1.39* 1.98*  CALCIUM 8.7* 8.6*    COAG Lab Results  Component Value Date   INR 1.2 11/28/2020   INR 1.0 10/02/2020   INR 1.1 12/14/2018   No results found for: PTT  Assessment/Planning:  65 year old female who has recently undergone left SFA intervention for in-stent restenosis by Dr. Scot Dock with inline flow and two-vessel runoff in the left foot.  Vascular was asked to reengage given evidence of popliteal stenosis on duplex.  Even with the popliteal stenosis she has inline flow and her toe pressure has improved significantly as previously noted.  I will let Dr. Scot Dock know of her admission and defer to him if he feels any further intervention is warranted.  Likely will see how this toe wound progresses and certainly if this makes no progress another angiogram may be warranted.  Awaiting first dressing change with podiatry.  Marty Heck 03/04/2021 9:38 AM --

## 2021-03-04 NOTE — Progress Notes (Signed)
PROGRESS NOTE  [name] CWC:[id number] DOB: [birth year] DOA: 02/26/2021 PCP: [name], MD   LOS: 5 days   Brief Narrative / Interim history: 65 year old female with nonischemic cardiomyopathy/AICD in place, A. fib, DM 2, OSA, hypothyroidism, chronic kidney disease stage IV who recently had left foot osteomyelitis and was admitted last month status post bone resection of the fifth proximal phalanx and metatarsal and sesamoidectomy was discharged home on May 3.  At that time vascular was also consulted and she was status post angioplasty of 3 separate in-stent stenosis in the left superficial femoral artery.  Intraoperative cultures grew Enterococcus and staph aureus and capitis, all pansensitive, per ID she was narrowed to Augmentin.  She has had worsening discharge from her foot and came back to the hospital from her podiatrist office.  Podiatry consulted  Subjective / 24h Interval events: No pain, no abdominal pain, no nausea or vomiting  Assessment & Plan: Principal Problem Left foot cellulitis, concerning for osteomyelitis-patient was started on empiric antibiotics with vancomycin and cefepime, cultures were sent.  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  Repeat arterial studies 5/27 showed a 50-9 9% stenosis on the distal left popliteal stent segment.  Vascular surgery consulted, no interventions for now, appreciate input.  DM2-poorly controlled, with hyperglycemia most recent A1c 8.1.  Continue Lantus, sliding scale.  Continue gabapentin for neuropathy.  CBGs are stable  CBG (last 3)  Recent Labs    03/03/21 2000 03/04/21 0007 03/04/21 0407  GLUCAP 281* 159* 77   Paroxysmal A. fib, NSVT-currently on amiodarone, dual  antiplatelet therapy which is now on hold.  Not sure the context of her prior A. fib episode but currently not on anticoagulation.  No active issues right now.  I will discuss with podiatry regarding resuming aspirin and Plavix  Chronic kidney disease, stage IV-Baseline creatinine 2.0-2.5, 1.9 this morning, close to baseline  Coronary artery disease -no chest pain, continue statin, Coreg, Imdur  Chronic systolic CHF, mixed ischemic/nonischemic cardiomyopathy with left bundle branch block -in 2020 her EF was 10-15%, now has a CRT and most recent 2D echo December 2021 showed EF 50-55%  Hypothyroidism-continue Synthroid  OSA-continue CPAP  Anemia of chronic disease-hemoglobin stable, will monitor postoperatively  Scheduled Meds: . amiodarone  200 mg Oral Daily  . atorvastatin  10 mg Oral Daily  . calcitRIOL  0.5 mcg Oral q morning  . carvedilol  3.125 mg Oral q morning  . DULoxetine  30 mg Oral QHS  . ferrous sulfate  325 mg Oral q morning  . gabapentin  600 mg Oral QHS  . insulin aspart  0-9 Units Subcutaneous Q4H  . insulin glargine  22 Units Subcutaneous Q2200  . isosorbide mononitrate  30 mg Oral Daily  . levothyroxine  50 mcg Oral Q0600  . multivitamin with minerals  1 tablet Oral Daily  . Ensure Max Protein  11 oz Oral Daily   Continuous Infusions: . ceFEPime (MAXIPIME) IV 2 g (03/04/21 0418)  . vancomycin 2,000 mg (03/03/21 0518)   PRN Meds:.morphine injection, nitroGLYCERIN  Diet Orders (From admission, onward)    Start     Ordered   02/27/21 1554  Diet Carb Modified Fluid consistency: Thin; Room service appropriate? Yes  Diet effective now       Question Answer Comment  Diet-HS  Snack? Nothing   Calorie Level Medium 1600-2000   Fluid consistency: Thin   Room service appropriate? Yes      02/27/21 1553          DVT prophylaxis: SCDs Start: 02/27/21 0331     Code Status: Full Code  Family Communication: No family present at bedside  Status is:  Inpatient  Remains inpatient appropriate because:Inpatient level of care appropriate due to severity of illness   Dispo: The patient is from: Home              Anticipated d/c is to: Home              Patient currently is not medically stable to d/c.   Difficult to place patient No   Level of care: Telemetry Medical  Consultants:  Podiatry  Procedures:  None   Microbiology  Blood cultures 5/23-no growth  Antimicrobials: Vancomycin 5/23 >> Cefepime 5/23 >>   Objective: Vitals:   03/03/21 1239 03/03/21 1818 03/03/21 2237 03/04/21 0424  BP: (!) 141/55 123/60 (!) 148/58 (!) 151/59  Pulse: 63 69 65 63  Resp: 16 16 18 18   Temp: 97.7 F (36.5 C) 98.4 F (36.9 C) 98.6 F (37 C) 97.6 F (36.4 C)  TempSrc: Oral Oral Oral Oral  SpO2: 100% 100% 99% 96%  Weight:      Height:        Intake/Output Summary (Last 24 hours) at 03/04/2021 1059 Last data filed at 03/04/2021 0630 Gross per 24 hour  Intake --  Output 1100 ml  Net -1100 ml   Filed Weights   02/27/21 0100 02/27/21 1729 03/02/21 1228  Weight: 130 kg 130 kg 130 kg    Examination:  Constitutional: NAD Eyes: No icterus ENMT: mmm Neck: normal, supple Respiratory: Clear bilaterally, no wheezing, no crackles Cardiovascular: Regular rate and rhythm, no murmurs Abdomen: Soft, NT, ND, bowel sounds positive Musculoskeletal: no clubbing / cyanosis.  Skin: No rashes seen Neurologic: No focal deficits   Data Reviewed: I have independently reviewed following labs and imaging studies   CBC: Recent Labs  Lab 02/26/21 2000 02/27/21 1917 03/01/21 0309 03/02/21 0235 03/03/21 0131 03/04/21 0311  WBC 7.3 5.5 5.7 6.4 6.8 8.4  NEUTROABS 4.5  --   --   --   --   --   HGB 11.5* 10.7* 10.7* 11.5* 10.6* 9.7*  HCT 36.3 33.7* 32.5* 34.9* 32.0* 29.7*  MCV 96.3 95.2 92.9 91.8 92.5 93.4  PLT 184 149* 148* 152 157 194*   Basic Metabolic Panel: Recent Labs  Lab 02/27/21 1917 03/01/21 0309 03/02/21 0235 03/03/21 0131  03/04/21 0311  NA 139 140 138 135 136  K 4.9 3.8 3.9 5.0 4.1  CL 106 108 107 108 108  CO2 25 24 23  21* 23  GLUCOSE 190* 119* 234* 292* 83  BUN 29* 27* 28* 30* 46*  CREATININE 1.82* 1.49* 1.35* 1.39* 1.98*  CALCIUM 8.8* 8.9 9.1 8.7* 8.6*   Liver Function Tests: Recent Labs  Lab 02/26/21 2000 03/03/21 0131  AST 17 16  ALT 15 14  ALKPHOS 64 55  BILITOT 0.7 0.4  PROT 7.2 6.1*  ALBUMIN 3.5 3.0*   Coagulation Profile: No results for input(s): INR, PROTIME in the last 168 hours. HbA1C: No results for input(s): HGBA1C in the last 72 hours. CBG: Recent Labs  Lab 03/03/21 1103 03/03/21 1607 03/03/21 2000 03/04/21 0007 03/04/21 0407  GLUCAP 261* 273* 281* 159* 77    Recent Results (from the past 240  hour(s))  Culture, blood (routine x 2)     Status: None   Collection Time: 02/26/21  7:00 PM   Specimen: BLOOD RIGHT ARM  Result Value Ref Range Status   Specimen Description BLOOD RIGHT ARM  Final   Special Requests   Final    BOTTLES DRAWN AEROBIC AND ANAEROBIC Blood Culture adequate volume   Culture   Final    NO GROWTH 5 DAYS Performed at [facility], [address], [place], [place] [address]    Report Status 03/03/2021 FINAL  Final  Culture, blood (routine x 2)     Status: None   Collection Time: 02/26/21  7:15 PM   Specimen: BLOOD LEFT ARM  Result Value Ref Range Status   Specimen Description BLOOD LEFT ARM  Final   Special Requests   Final    BOTTLES DRAWN AEROBIC ONLY Blood Culture results may not be optimal due to an excessive volume of blood received in culture bottles   Culture   Final    NO GROWTH 5 DAYS Performed at [facility], [place] [address] [place], [place] [address]    Report Status 03/03/2021 FINAL  Final  Resp Panel by RT-PCR (Flu A&B, Covid) Nasopharyngeal Swab     Status: None   Collection Time: 02/27/21  3:22 AM   Specimen: Nasopharyngeal Swab; Nasopharyngeal(NP) swabs in vial transport medium  Result Value Ref Range Status    SARS Coronavirus 2 by RT PCR NEGATIVE NEGATIVE Final    Comment: (NOTE) SARS-CoV-2 target nucleic acids are NOT DETECTED.  The SARS-CoV-2 RNA is generally detectable in upper respiratory specimens during the acute phase of infection. The lowest concentration of SARS-CoV-2 viral copies this assay can detect is 138 copies/mL. A negative result does not preclude SARS-Cov-2 infection and should not be used as the sole basis for treatment or other patient management decisions. A negative result may occur with  improper specimen collection/handling, submission of specimen other than nasopharyngeal swab, presence of viral mutation(s) within the areas targeted by this assay, and inadequate number of viral copies(<138 copies/mL). A negative result must be combined with clinical observations, patient history, and epidemiological information. The expected result is Negative.  Fact Sheet for Patients:  EntrepreneurPulse.com.au  Fact Sheet for Healthcare Providers:  IncredibleEmployment.be  This test is no t yet approved or cleared by the [place] FDA and  has been authorized for detection and/or diagnosis of SARS-CoV-2 by FDA under an Emergency Use Authorization (EUA). This EUA will remain  in effect (meaning this test can be used) for the duration of the COVID-19 declaration under Section 564(b)(1) of the Act, 21 U.S.C.section 360bbb-3(b)(1), unless the authorization is terminated  or revoked sooner.       Influenza A by PCR NEGATIVE NEGATIVE Final   Influenza B by PCR NEGATIVE NEGATIVE Final    Comment: (NOTE) The Xpert Xpress SARS-CoV-2/FLU/RSV plus assay is intended as an aid in the diagnosis of influenza from Nasopharyngeal swab specimens and should not be used as a sole basis for treatment. Nasal washings and aspirates are unacceptable for Xpert Xpress SARS-CoV-2/FLU/RSV testing.  Fact Sheet for  Patients: EntrepreneurPulse.com.au  Fact Sheet for Healthcare Providers: IncredibleEmployment.be  This test is not yet approved or cleared by the [place] FDA and has been authorized for detection and/or diagnosis of SARS-CoV-2 by FDA under an Emergency Use Authorization (EUA). This EUA will remain in effect (meaning this test can be used) for the duration of the COVID-19 declaration under  Section 564(b)(1) of the Act, 21 U.S.C. section 360bbb-3(b)(1), unless the authorization is terminated or revoked.  Performed at [facility], [place] [address] [place], [place] [address]   MRSA PCR Screening     Status: None   Collection Time: 03/01/21  9:00 PM   Specimen: Nasopharyngeal  Result Value Ref Range Status   MRSA by PCR NEGATIVE NEGATIVE Final    Comment:        The GeneXpert MRSA Assay (FDA approved for NASAL specimens only), is one component of a comprehensive MRSA colonization surveillance program. It is not intended to diagnose MRSA infection nor to guide or monitor treatment for MRSA infections. Performed at [facility], [place] [address], [place], [place] [address]   Aerobic/Anaerobic Culture w Gram Stain (surgical/deep wound)     Status: None (Preliminary result)   Collection Time: 03/02/21  2:10 PM   Specimen: Bone; Tissue  Result Value Ref Range Status   Specimen Description BONE LEFT TOE  Final   Special Requests BASE OF LEFT GREAT TOE  Final   Gram Stain   Final    RARE WBC PRESENT,BOTH PMN AND MONONUCLEAR NO ORGANISMS SEEN    Culture   Final    NO GROWTH < 24 HOURS Performed at [facility], [place] [address] [place], [place] [address]    Report Status PENDING  Incomplete     Radiology Studies: No results found.  Marzetta Board, MD, PhD [facility]  Between 7 am - 7 pm I am available, please contact me via Amion (for emergencies) or Securechat (non urgent messages)  Between 7 pm - 7 am I am  not available, please contact night coverage MD/APP via Amion

## 2021-03-05 DIAGNOSIS — N184 Chronic kidney disease, stage 4 (severe): Secondary | ICD-10-CM | POA: Diagnosis not present

## 2021-03-05 DIAGNOSIS — I10 Essential (primary) hypertension: Secondary | ICD-10-CM | POA: Diagnosis not present

## 2021-03-05 DIAGNOSIS — E1129 Type 2 diabetes mellitus with other diabetic kidney complication: Secondary | ICD-10-CM | POA: Diagnosis not present

## 2021-03-05 DIAGNOSIS — L03116 Cellulitis of left lower limb: Secondary | ICD-10-CM | POA: Diagnosis not present

## 2021-03-05 LAB — CBC
HCT: 28.2 % — ABNORMAL LOW (ref 36.0–46.0)
Hemoglobin: 9.2 g/dL — ABNORMAL LOW (ref 12.0–15.0)
MCH: 30.6 pg (ref 26.0–34.0)
MCHC: 32.6 g/dL (ref 30.0–36.0)
MCV: 93.7 fL (ref 80.0–100.0)
Platelets: 141 10*3/uL — ABNORMAL LOW (ref 150–400)
RBC: 3.01 MIL/uL — ABNORMAL LOW (ref 3.87–5.11)
RDW: 13.7 % (ref 11.5–15.5)
WBC: 6.8 10*3/uL (ref 4.0–10.5)
nRBC: 0 % (ref 0.0–0.2)

## 2021-03-05 LAB — BASIC METABOLIC PANEL
Anion gap: 9 (ref 5–15)
BUN: 52 mg/dL — ABNORMAL HIGH (ref 8–23)
CO2: 20 mmol/L — ABNORMAL LOW (ref 22–32)
Calcium: 8.7 mg/dL — ABNORMAL LOW (ref 8.9–10.3)
Chloride: 107 mmol/L (ref 98–111)
Creatinine, Ser: 1.85 mg/dL — ABNORMAL HIGH (ref 0.44–1.00)
GFR, Estimated: 30 mL/min — ABNORMAL LOW (ref >60)
Glucose, Bld: 174 mg/dL — ABNORMAL HIGH (ref 70–99)
Potassium: 4.1 mmol/L (ref 3.5–5.1)
Sodium: 136 mmol/L (ref 135–145)

## 2021-03-05 LAB — GLUCOSE, CAPILLARY
Glucose-Capillary: 152 mg/dL — ABNORMAL HIGH (ref 70–99)
Glucose-Capillary: 187 mg/dL — ABNORMAL HIGH (ref 70–99)
Glucose-Capillary: 191 mg/dL — ABNORMAL HIGH (ref 70–99)
Glucose-Capillary: 195 mg/dL — ABNORMAL HIGH (ref 70–99)
Glucose-Capillary: 205 mg/dL — ABNORMAL HIGH (ref 70–99)

## 2021-03-05 NOTE — Progress Notes (Signed)
MD Gherghe notified of patient's positive culture results.

## 2021-03-05 NOTE — Progress Notes (Signed)
Pharmacy Antibiotic Note  [name] is a 65 y.o. female admitted on 02/26/2021 with post-op wound infection - L foot.  Pharmacy has been consulted for vancomycin and cefepime dosing. Patient has been receiving vancomycin 2000mg  q48h and cefepime 2g q12h. 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 Bone culture with rare s. Capitis (reincubated).   Plan: Continue vancomycin 2000mg  IV q48h (eAUC 402 using Scr 1.35, Vd 0.5) Continue cefepime 2g q12h  Follow up length of therapy, vancomycin levels as indication, cultures, de-escalation, and clinical progression   Height: 5\' 8"  (172.7 cm) Weight: 130 kg (286 lb 9.6 oz) IBW/kg (Calculated) : 63.9  Temp (24hrs), Avg:98.2 F (36.8 C), Min:97.6 F (36.4 C), Max:98.4 F (36.9 C)  Recent Labs  Lab 03/01/21 0309 03/02/21 0235 03/03/21 0131 03/04/21 0311 03/05/21 0121  WBC 5.7 6.4 6.8 8.4 6.8  CREATININE 1.49* 1.35* 1.39* 1.98* 1.85*    Estimated Creatinine Clearance: 42.1 mL/min (A) (by C-G formula based on SCr of 1.85 mg/dL (H)).    Allergies  Allergen Reactions  . Nsaids     Kidney disease    Antimicrobials this admission: 5/24 Vanc >>  5/24 cefepime >> 5/24 Zosyn x 1  Microbiology results: 5/23 BCx: negative  5/27 toe bone: rare s. capitis  Thank you for allowing pharmacy to be a part of this patient's care.  Cristela Felt, PharmD Clinical Pharmacist  03/05/2021 2:42 PM

## 2021-03-05 NOTE — Progress Notes (Signed)
PROGRESS NOTE  [name] GYJ:[id number] DOB: 09/03/1956 DOA: 02/26/2021 PCP: [name], MD   LOS: 6 days   Brief Narrative / Interim history: 65 year old female with nonischemic cardiomyopathy/AICD in place, A. fib, DM 2, OSA, hypothyroidism, chronic kidney disease stage IV who recently had left foot osteomyelitis and was admitted last month status post bone resection of the fifth proximal phalanx and metatarsal and sesamoidectomy was discharged home on May 3.  At that time vascular was also consulted and she was status post angioplasty of 3 separate in-stent stenosis in the left superficial femoral artery.  Intraoperative cultures grew Enterococcus and staph aureus and capitis, all pansensitive, per ID she was narrowed to Augmentin.  She has had worsening discharge from her foot and came back to the hospital from her podiatrist office.  Podiatry consulted  Subjective / 24h Interval events: No complaints, feels well  Assessment & Plan: Principal Problem Left foot cellulitis, concerning for osteomyelitis-patient was started on empiric antibiotics with vancomycin and cefepime, cultures were sent.  Podiatry consulted, she is status post ray amputation 5/27, tissue cultures sent intraoperatively, monitor microbiology and narrow accordingly -Tissue cultures remain negative, if they will be negative by tomorrow very unlikely that the bacteria will be identified  Active Problems Peripheral arterial disease-she just recently had angioplasty few weeks ago.  Repeat arterial studies 5/27 showed a 50-9 9% stenosis on the distal left popliteal stent segment.  Vascular surgery consulted, no interventions for now, appreciate input.  DM2-poorly controlled, with hyperglycemia most recent A1c 8.1.  Continue Lantus, sliding scale.  Continue gabapentin for neuropathy.  CBGs are stable today  CBG (last 3)  Recent Labs    03/04/21 2003 03/05/21 0000 03/05/21 0407  GLUCAP 311* 191* 152*    Paroxysmal A. fib, NSVT-currently on amiodarone, dual antiplatelet therapy which is now on hold.  Not sure the context of her prior A. fib episode but currently not on anticoagulation.  No active issues right now.  I will discuss with podiatry regarding resuming aspirin and Plavix  Chronic kidney disease, stage IV-Baseline creatinine 2.0-2.5, 1.9 this morning, close to baseline  Coronary artery disease -no chest pain, continue statin, Coreg, Imdur  Chronic systolic CHF, mixed ischemic/nonischemic cardiomyopathy with left bundle branch block -in 2020 her EF was 10-15%, now has a CRT and most recent 2D echo December 2021 showed EF 50-55%  Hypothyroidism-continue Synthroid  OSA-continue CPAP  Anemia of chronic disease-hemoglobin stable, will monitor postoperatively  Scheduled Meds: . amiodarone  200 mg Oral Daily  . aspirin EC  81 mg Oral Daily  . atorvastatin  10 mg Oral Daily  . calcitRIOL  0.5 mcg Oral q morning  . carvedilol  3.125 mg Oral q morning  . clopidogrel  75 mg Oral Daily  . DULoxetine  30 mg Oral QHS  . ferrous sulfate  325 mg Oral q morning  . gabapentin  600 mg Oral QHS  . insulin aspart  0-9 Units Subcutaneous Q4H  . insulin glargine  22 Units Subcutaneous Q2200  . isosorbide mononitrate  30 mg Oral Daily  . levothyroxine  50 mcg Oral Q0600  . multivitamin with minerals  1 tablet Oral Daily  . Ensure Max Protein  11 oz Oral Daily   Continuous Infusions: . ceFEPime (MAXIPIME) IV 2 g (03/05/21 0418)  . vancomycin 2,000 mg (03/05/21 0518)   PRN Meds:.morphine injection, nitroGLYCERIN  Diet Orders (From admission, onward)    Start     Ordered   02/27/21 1554  Diet Carb Modified Fluid consistency: Thin; Room service appropriate? Yes  Diet effective now       Question Answer Comment  Diet-HS Snack? Nothing   Calorie Level Medium 1600-2000   Fluid consistency: Thin   Room service appropriate? Yes      02/27/21 1553          DVT prophylaxis: SCDs Start:  02/27/21 0331     Code Status: Full Code  Family Communication: No family present at bedside  Status is: Inpatient  Remains inpatient appropriate because:Inpatient level of care appropriate due to severity of illness   Dispo: The patient is from: Home              Anticipated d/c is to: Home              Patient currently is not medically stable to d/c.   Difficult to place patient No   Level of care: Telemetry Medical  Consultants:  Podiatry  Procedures:  None   Microbiology  Blood cultures 5/23-no growth  Antimicrobials: Vancomycin 5/23 >> Cefepime 5/23 >>   Objective: Vitals:   03/04/21 1159 03/04/21 1714 03/04/21 2357 03/05/21 0405  BP: (!) 146/58 127/68 (!) 151/63 (!) 165/90  Pulse: 65 66 61 67  Resp: 18 18 16 18   Temp: 98.2 F (36.8 C) 98.3 F (36.8 C) 98.4 F (36.9 C) 98.4 F (36.9 C)  TempSrc: Oral Oral Oral Oral  SpO2: 98% 100% 99% 100%  Weight:      Height:        Intake/Output Summary (Last 24 hours) at 03/05/2021 1106 Last data filed at 03/05/2021 0272 Gross per 24 hour  Intake 240 ml  Output 300 ml  Net -60 ml   Filed Weights   02/27/21 0100 02/27/21 1729 03/02/21 1228  Weight: 130 kg 130 kg 130 kg    Examination:  Constitutional: No distress Eyes: No icterus ENMT: mmm Neck: normal, supple Respiratory: Clear bilaterally, no wheezing or crackles Cardiovascular: Regular rate and rhythm, no murmurs Abdomen: Soft, NT, ND, positive bowel sounds Musculoskeletal: no clubbing / cyanosis.  Skin: No rashes seen Neurologic: No focal deficits   Data Reviewed: I have independently reviewed following labs and imaging studies   CBC: Recent Labs  Lab 02/26/21 2000 02/27/21 1917 03/01/21 0309 03/02/21 0235 03/03/21 0131 03/04/21 0311 03/05/21 0121  WBC 7.3   < > 5.7 6.4 6.8 8.4 6.8  NEUTROABS 4.5  --   --   --   --   --   --   HGB 11.5*   < > 10.7* 11.5* 10.6* 9.7* 9.2*  HCT 36.3   < > 32.5* 34.9* 32.0* 29.7* 28.2*  MCV 96.3   < >  92.9 91.8 92.5 93.4 93.7  PLT 184   < > 148* 152 157 147* 141*   < > = values in this interval not displayed.   Basic Metabolic Panel: Recent Labs  Lab 03/01/21 0309 03/02/21 0235 03/03/21 0131 03/04/21 0311 03/05/21 0121  NA 140 138 135 136 136  K 3.8 3.9 5.0 4.1 4.1  CL 108 107 108 108 107  CO2 24 23 21* 23 20*  GLUCOSE 119* 234* 292* 83 174*  BUN 27* 28* 30* 46* 52*  CREATININE 1.49* 1.35* 1.39* 1.98* 1.85*  CALCIUM 8.9 9.1 8.7* 8.6* 8.7*   Liver Function Tests: Recent Labs  Lab 02/26/21 2000 03/03/21 0131  AST 17 16  ALT 15 14  ALKPHOS 64 55  BILITOT 0.7 0.4  PROT  7.2 6.1*  ALBUMIN 3.5 3.0*   Coagulation Profile: No results for input(s): INR, PROTIME in the last 168 hours. HbA1C: No results for input(s): HGBA1C in the last 72 hours. CBG: Recent Labs  Lab 03/04/21 1146 03/04/21 1614 03/04/21 2003 03/05/21 0000 03/05/21 0407  GLUCAP 200* 187* 311* 191* 152*    Recent Results (from the past 240 hour(s))  Culture, blood (routine x 2)     Status: None   Collection Time: 02/26/21  7:00 PM   Specimen: BLOOD RIGHT ARM  Result Value Ref Range Status   Specimen Description BLOOD RIGHT ARM  Final   Special Requests   Final    BOTTLES DRAWN AEROBIC AND ANAEROBIC Blood Culture adequate volume   Culture   Final    NO GROWTH 5 DAYS Performed at [facility], [name] [address], [place], [place] [address]    Report Status 03/03/2021 FINAL  Final  Culture, blood (routine x 2)     Status: None   Collection Time: 02/26/21  7:15 PM   Specimen: BLOOD LEFT ARM  Result Value Ref Range Status   Specimen Description BLOOD LEFT ARM  Final   Special Requests   Final    BOTTLES DRAWN AEROBIC ONLY Blood Culture results may not be optimal due to an excessive volume of blood received in culture bottles   Culture   Final    NO GROWTH 5 DAYS Performed at [facility], [place] [address], [place], [place] [address]    Report Status 03/03/2021 FINAL  Final  Resp Panel by  RT-PCR (Flu A&B, Covid) Nasopharyngeal Swab     Status: None   Collection Time: 02/27/21  3:22 AM   Specimen: Nasopharyngeal Swab; Nasopharyngeal(NP) swabs in vial transport medium  Result Value Ref Range Status   SARS Coronavirus 2 by RT PCR NEGATIVE NEGATIVE Final    Comment: (NOTE) SARS-CoV-2 target nucleic acids are NOT DETECTED.  The SARS-CoV-2 RNA is generally detectable in upper respiratory specimens during the acute phase of infection. The lowest concentration of SARS-CoV-2 viral copies this assay can detect is 138 copies/mL. A negative result does not preclude SARS-Cov-2 infection and should not be used as the sole basis for treatment or other patient management decisions. A negative result may occur with  improper specimen collection/handling, submission of specimen other than nasopharyngeal swab, presence of viral mutation(s) within the areas targeted by this assay, and inadequate number of viral copies(<138 copies/mL). A negative result must be combined with clinical observations, patient history, and epidemiological information. The expected result is Negative.  Fact Sheet for Patients:  EntrepreneurPulse.com.au  Fact Sheet for Healthcare Providers:  IncredibleEmployment.be  This test is no t yet approved or cleared by the [place] FDA and  has been authorized for detection and/or diagnosis of SARS-CoV-2 by FDA under an Emergency Use Authorization (EUA). This EUA will remain  in effect (meaning this test can be used) for the duration of the COVID-19 declaration under Section 564(b)(1) of the Act, 21 U.S.C.section 360bbb-3(b)(1), unless the authorization is terminated  or revoked sooner.       Influenza A by PCR NEGATIVE NEGATIVE Final   Influenza B by PCR NEGATIVE NEGATIVE Final    Comment: (NOTE) The Xpert Xpress SARS-CoV-2/FLU/RSV plus assay is intended as an aid in the diagnosis of influenza from Nasopharyngeal swab  specimens and should not be used as a sole basis for treatment. Nasal washings and aspirates are unacceptable for Xpert Xpress SARS-CoV-2/FLU/RSV testing.  Fact  Sheet for Patients: EntrepreneurPulse.com.au  Fact Sheet for Healthcare Providers: IncredibleEmployment.be  This test is not yet approved or cleared by the [place] FDA and has been authorized for detection and/or diagnosis of SARS-CoV-2 by FDA under an Emergency Use Authorization (EUA). This EUA will remain in effect (meaning this test can be used) for the duration of the COVID-19 declaration under Section 564(b)(1) of the Act, 21 U.S.C. section 360bbb-3(b)(1), unless the authorization is terminated or revoked.  Performed at [facility], [place] [address] [place], [place] [address]   MRSA PCR Screening     Status: None   Collection Time: 03/01/21  9:00 PM   Specimen: Nasopharyngeal  Result Value Ref Range Status   MRSA by PCR NEGATIVE NEGATIVE Final    Comment:        The GeneXpert MRSA Assay (FDA approved for NASAL specimens only), is one component of a comprehensive MRSA colonization surveillance program. It is not intended to diagnose MRSA infection nor to guide or monitor treatment for MRSA infections. Performed at [facility], [place] [address], [place], [place] [address]   Aerobic/Anaerobic Culture w Gram Stain (surgical/deep wound)     Status: None (Preliminary result)   Collection Time: 03/02/21  2:10 PM   Specimen: Bone; Tissue  Result Value Ref Range Status   Specimen Description BONE LEFT TOE  Final   Special Requests BASE OF LEFT GREAT TOE  Final   Gram Stain   Final    RARE WBC PRESENT,BOTH PMN AND MONONUCLEAR NO ORGANISMS SEEN    Culture   Final    NO GROWTH 2 DAYS NO ANAEROBES ISOLATED; CULTURE IN PROGRESS FOR 5 DAYS Performed at [facility], [place] [address] [place],  [address]    Report Status PENDING  Incomplete      Radiology Studies: No results found.  Marzetta Board, MD, PhD [facility]  Between 7 am - 7 pm I am available, please contact me via Amion (for emergencies) or Securechat (non urgent messages)  Between 7 pm - 7 am I am not available, please contact night coverage MD/APP via Amion

## 2021-03-05 NOTE — Progress Notes (Signed)
Physical Therapy Treatment Patient Details Name: [name] MRN: [id number] DOB: Feb 06, 1956 Today's Date: 03/05/2021    History of Present Illness The pt is a 65 yo female presenting 4/24 due to non-healing wound on L foot after 3 weeks of antibiotics. Pt found to have dampened signals in the L foot and underwent arteriography with stant placement in superficial femoral artery on 4/29. I&D of L foot wound on 4/30. PMH includes: MI, osteomyelitis of foot and ankle, CAD, CKD, CHF, HTN, neuropathy, obesity, DM II, multiple toe amputations, and heart caths, and biventricular cardioverter defibrillator implantation.    PT Comments    Pt progressing towards goals and able to increase ambulation distance. Pt requiring min guard to supervision for safety. Cues for maintaining WB status, however, pt with foot drop on RLE, so had some difficulty with heel walking on the R. Educated about using WC for longer distances. Current recommendations appropriate. Will continue to follow acutely.   Follow Up Recommendations  No PT follow up;Supervision - Intermittent     Equipment Recommendations  None recommended by PT    Recommendations for Other Services       Precautions / Restrictions Precautions Precautions: Fall Required Braces or Orthoses: Other Brace (cam walker shoe) Restrictions Weight Bearing Restrictions: Yes LLE Weight Bearing: Partial weight bearing LLE Partial Weight Bearing Percentage or Pounds: through heel only    Mobility  Bed Mobility Overal bed mobility: Modified Independent                  Transfers Overall transfer level: Needs assistance Equipment used: Rolling walker (2 wheeled) Transfers: Sit to/from Stand Sit to Stand: Min guard;From elevated surface         General transfer comment: Min guard for safety. Required elevated surface to sit.  Ambulation/Gait Ambulation/Gait assistance: Min guard;Supervision Gait Distance (Feet): 75 Feet Assistive  device: Rolling walker (2 wheeled) Gait Pattern/deviations: Step-to pattern Gait velocity: Decreased   General Gait Details: step to pattern. Unsure of ability to maintain precautions as pt with foot drop on the L. Pt did report she was keeping weight on heel of foot.   Stairs             Wheelchair Mobility    Modified Rankin (Stroke Patients Only)       Balance Overall balance assessment: Needs assistance Sitting-balance support: No upper extremity supported Sitting balance-Leahy Scale: Good     Standing balance support: Bilateral upper extremity supported Standing balance-Leahy Scale: Poor Standing balance comment: reliant on BUE support due to [facility] restriction                            Cognition Arousal/Alertness: Awake/alert Behavior During Therapy: WFL for tasks assessed/performed Overall Cognitive Status: Within Functional Limits for tasks assessed                                        Exercises      General Comments        Pertinent Vitals/Pain Pain Assessment: No/denies pain (does not have good sensation in feet)    Home Living                      Prior Function            PT Goals (current goals can now be found in the  care plan section) Acute Rehab PT Goals Patient Stated Goal: to go home PT Goal Formulation: With patient Time For Goal Achievement: 03/16/21 Potential to Achieve Goals: Good Progress towards PT goals: Progressing toward goals    Frequency    Min 3X/week      PT Plan Current plan remains appropriate;Equipment recommendations need to be updated    Co-evaluation              AM-PAC PT "6 Clicks" Mobility   Outcome Measure  Help needed turning from your back to your side while in a flat bed without using bedrails?: None Help needed moving from lying on your back to sitting on the side of a flat bed without using bedrails?: None Help needed moving to and from a bed to a  chair (including a wheelchair)?: A Little Help needed standing up from a chair using your arms (e.g., wheelchair or bedside chair)?: A Little Help needed to walk in hospital room?: A Little Help needed climbing 3-5 steps with a railing? : A Little 6 Click Score: 20    End of Session Equipment Utilized During Treatment: Gait belt Activity Tolerance: Patient tolerated treatment well Patient left: in chair;with call bell/phone within reach Nurse Communication: Mobility status PT Visit Diagnosis: Other abnormalities of gait and mobility (R26.89);Difficulty in walking, not elsewhere classified (R26.2)     Time: 1624-4695 PT Time Calculation (min) (ACUTE ONLY): 16 min  Charges:  $Gait Training: 8-22 mins                     Reuel Derby, PT, DPT  Acute Rehabilitation Services  Pager: [phone] Office: [phone]    Rudean Hitt 03/05/2021, 9:30 AM

## 2021-03-05 NOTE — Progress Notes (Signed)
Subjective:  Patient ID: [name], female    DOB: 08-12-1956,  MRN: [id number]  Patient seen bedside. Resting comfortably. Denies pain. Tolerating PO diet. Objective:   Vitals:   03/05/21 0405 03/05/21 1138  BP: (!) 165/90 (!) 145/67  Pulse: 67 61  Resp: 18 18  Temp: 98.4 F (36.9 C) 97.6 F (36.4 C)  SpO2: 100% 100%   Objective: AAO x3, NAD-resting comfortably in bed DP/PT pulses palpable bilaterally, CRT less than 3 seconds Left foot warm and well perfused. Amputation site healing well with intact sutures. No excessive warmth, erythema noted. Mod SS drainage on dressings prior to removal.  Dressing intact left leg.   Results for orders placed or performed during the hospital encounter of 02/26/21  Culture, blood (routine x 2)     Status: None   Collection Time: 02/26/21  7:00 PM   Specimen: BLOOD RIGHT ARM  Result Value Ref Range Status   Specimen Description BLOOD RIGHT ARM  Final   Special Requests   Final    BOTTLES DRAWN AEROBIC AND ANAEROBIC Blood Culture adequate volume   Culture   Final    NO GROWTH 5 DAYS Performed at [facility], [place] [address] [place], [place] [address]    Report Status 03/03/2021 FINAL  Final  Culture, blood (routine x 2)     Status: None   Collection Time: 02/26/21  7:15 PM   Specimen: BLOOD LEFT ARM  Result Value Ref Range Status   Specimen Description BLOOD LEFT ARM  Final   Special Requests   Final    BOTTLES DRAWN AEROBIC ONLY Blood Culture results may not be optimal due to an excessive volume of blood received in culture bottles   Culture   Final    NO GROWTH 5 DAYS Performed at [facility], [place] [address] [place], [place] [address]    Report Status 03/03/2021 FINAL  Final  Resp Panel by RT-PCR (Flu A&B, Covid) Nasopharyngeal Swab     Status: None   Collection Time: 02/27/21  3:22 AM   Specimen: Nasopharyngeal Swab; Nasopharyngeal(NP) swabs in vial transport medium  Result Value Ref Range Status   SARS  Coronavirus 2 by RT PCR NEGATIVE NEGATIVE Final    Comment: (NOTE) SARS-CoV-2 target nucleic acids are NOT DETECTED.  The SARS-CoV-2 RNA is generally detectable in upper respiratory specimens during the acute phase of infection. The lowest concentration of SARS-CoV-2 viral copies this assay can detect is 138 copies/mL. A negative result does not preclude SARS-Cov-2 infection and should not be used as the sole basis for treatment or other patient management decisions. A negative result may occur with  improper specimen collection/handling, submission of specimen other than nasopharyngeal swab, presence of viral mutation(s) within the areas targeted by this assay, and inadequate number of viral copies(<138 copies/mL). A negative result must be combined with clinical observations, patient history, and epidemiological information. The expected result is Negative.  Fact Sheet for Patients:  EntrepreneurPulse.com.au  Fact Sheet for Healthcare Providers:  IncredibleEmployment.be  This test is no t yet approved or cleared by the [place] FDA and  has been authorized for detection and/or diagnosis of SARS-CoV-2 by FDA under an Emergency Use Authorization (EUA). This EUA will remain  in effect (meaning this test can be used) for the duration of the COVID-19 declaration under Section 564(b)(1) of the Act, 21 U.S.C.section 360bbb-3(b)(1), unless the authorization is terminated  or revoked sooner.       Influenza A  by PCR NEGATIVE NEGATIVE Final   Influenza B by PCR NEGATIVE NEGATIVE Final    Comment: (NOTE) The Xpert Xpress SARS-CoV-2/FLU/RSV plus assay is intended as an aid in the diagnosis of influenza from Nasopharyngeal swab specimens and should not be used as a sole basis for treatment. Nasal washings and aspirates are unacceptable for Xpert Xpress SARS-CoV-2/FLU/RSV testing.  Fact Sheet for  Patients: EntrepreneurPulse.com.au  Fact Sheet for Healthcare Providers: IncredibleEmployment.be  This test is not yet approved or cleared by the [place] FDA and has been authorized for detection and/or diagnosis of SARS-CoV-2 by FDA under an Emergency Use Authorization (EUA). This EUA will remain in effect (meaning this test can be used) for the duration of the COVID-19 declaration under Section 564(b)(1) of the Act, 21 U.S.C. section 360bbb-3(b)(1), unless the authorization is terminated or revoked.  Performed at [facility], [place]  [address], [place], [place] [address]   MRSA PCR Screening     Status: None   Collection Time: 03/01/21  9:00 PM   Specimen: Nasopharyngeal  Result Value Ref Range Status   MRSA by PCR NEGATIVE NEGATIVE Final    Comment:        The GeneXpert MRSA Assay (FDA approved for NASAL specimens only), is one component of a comprehensive MRSA colonization surveillance program. It is not intended to diagnose MRSA infection nor to guide or monitor treatment for MRSA infections. Performed at [facility], [place] [address] [place], [place] [address]   Aerobic/Anaerobic Culture w Gram Stain (surgical/deep wound)     Status: None (Preliminary result)   Collection Time: 03/02/21  2:10 PM   Specimen: Bone; Tissue  Result Value Ref Range Status   Specimen Description BONE LEFT TOE  Final   Special Requests BASE OF LEFT GREAT TOE  Final   Gram Stain   Final    RARE WBC PRESENT,BOTH PMN AND MONONUCLEAR NO ORGANISMS SEEN    Culture   Final    RARE STAPHYLOCOCCUS CAPITIS CULTURE REINCUBATED FOR BETTER GROWTH NO ANAEROBES ISOLATED; CULTURE IN PROGRESS FOR 5 DAYS CRITICAL RESULT CALLED TO, READ BACK BY AND VERIFIED WITH: RN L.ILLERBRUN AT 0626 ON 03/05/2021 BY T.SAAD. Performed at [facility], [place] [address], [place],  [address]    Report Status PENDING  Incomplete   *Note: Due to a large  number of results and/or encounters for the requested time period, some results have not been displayed. A complete set of results can be found in Results Review.    Assessment & Plan:  Patient was evaluated and treated and all questions answered.  Left foot osteomyelitis s/p partial 1st ray resection  -Labs reviewed. No leukocytosis. Downtrending Cr. -WBAT to the heel in surgical shoe -PT/OT -Recc ID consult for abx reccs for positive bone margin. Awaiting sensitivity. -Will be ok for d/c once home Abx plan in place. -Will continue to follow.  Evelina Bucy, DPM  Call/secure chat with concerns. Provider cell number listed on secure chat.

## 2021-03-06 ENCOUNTER — Encounter (HOSPITAL_COMMUNITY): Payer: Self-pay | Admitting: Podiatry

## 2021-03-06 ENCOUNTER — Inpatient Hospital Stay (HOSPITAL_COMMUNITY): Payer: HMO

## 2021-03-06 DIAGNOSIS — I739 Peripheral vascular disease, unspecified: Secondary | ICD-10-CM

## 2021-03-06 DIAGNOSIS — E1129 Type 2 diabetes mellitus with other diabetic kidney complication: Secondary | ICD-10-CM | POA: Diagnosis not present

## 2021-03-06 DIAGNOSIS — M869 Osteomyelitis, unspecified: Secondary | ICD-10-CM | POA: Diagnosis not present

## 2021-03-06 DIAGNOSIS — L03116 Cellulitis of left lower limb: Secondary | ICD-10-CM

## 2021-03-06 DIAGNOSIS — E1165 Type 2 diabetes mellitus with hyperglycemia: Secondary | ICD-10-CM

## 2021-03-06 DIAGNOSIS — I10 Essential (primary) hypertension: Secondary | ICD-10-CM | POA: Diagnosis not present

## 2021-03-06 DIAGNOSIS — N184 Chronic kidney disease, stage 4 (severe): Secondary | ICD-10-CM | POA: Diagnosis not present

## 2021-03-06 HISTORY — PX: IR US GUIDE VASC ACCESS RIGHT: IMG2390

## 2021-03-06 HISTORY — PX: IR FLUORO GUIDE CV LINE RIGHT: IMG2283

## 2021-03-06 LAB — BASIC METABOLIC PANEL
Anion gap: 8 (ref 5–15)
BUN: 42 mg/dL — ABNORMAL HIGH (ref 8–23)
CO2: 21 mmol/L — ABNORMAL LOW (ref 22–32)
Calcium: 8.9 mg/dL (ref 8.9–10.3)
Chloride: 107 mmol/L (ref 98–111)
Creatinine, Ser: 1.58 mg/dL — ABNORMAL HIGH (ref 0.44–1.00)
GFR, Estimated: 36 mL/min — ABNORMAL LOW (ref >60)
Glucose, Bld: 150 mg/dL — ABNORMAL HIGH (ref 70–99)
Potassium: 3.9 mmol/L (ref 3.5–5.1)
Sodium: 136 mmol/L (ref 135–145)

## 2021-03-06 LAB — GLUCOSE, CAPILLARY
Glucose-Capillary: 128 mg/dL — ABNORMAL HIGH (ref 70–99)
Glucose-Capillary: 131 mg/dL — ABNORMAL HIGH (ref 70–99)
Glucose-Capillary: 143 mg/dL — ABNORMAL HIGH (ref 70–99)
Glucose-Capillary: 201 mg/dL — ABNORMAL HIGH (ref 70–99)
Glucose-Capillary: 206 mg/dL — ABNORMAL HIGH (ref 70–99)
Glucose-Capillary: 255 mg/dL — ABNORMAL HIGH (ref 70–99)
Glucose-Capillary: 70 mg/dL (ref 70–99)

## 2021-03-06 LAB — C-REACTIVE PROTEIN: CRP: 0.7 mg/dL (ref <1.0)

## 2021-03-06 LAB — SEDIMENTATION RATE: Sed Rate: 54 mm/hr — ABNORMAL HIGH (ref 0–22)

## 2021-03-06 LAB — SURGICAL PATHOLOGY

## 2021-03-06 MED ORDER — CHLORHEXIDINE GLUCONATE CLOTH 2 % EX PADS
6.0000 | MEDICATED_PAD | Freq: Every day | CUTANEOUS | Status: DC
  Administered 2021-03-06 – 2021-03-07 (×2): 6 via TOPICAL

## 2021-03-06 MED ORDER — LIDOCAINE HCL 1 % IJ SOLN
INTRAMUSCULAR | Status: AC
  Filled 2021-03-06: qty 20

## 2021-03-06 MED ORDER — "THROMBI-PAD 3""X3"" EX PADS"
1.0000 | MEDICATED_PAD | Freq: Once | CUTANEOUS | Status: AC
  Administered 2021-03-06: 1 via TOPICAL
  Filled 2021-03-06: qty 1

## 2021-03-06 MED ORDER — LIDOCAINE HCL 1 % IJ SOLN
INTRAMUSCULAR | Status: DC | PRN
  Administered 2021-03-06: 10 mL

## 2021-03-06 MED ORDER — THROMBIN 20000 UNITS EX KIT
20000.0000 [IU] | PACK | Freq: Once | CUTANEOUS | Status: DC
  Filled 2021-03-06: qty 1

## 2021-03-06 MED ORDER — SODIUM CHLORIDE 0.9 % IV SOLN
8.0000 mg/kg | Freq: Every day | INTRAVENOUS | Status: DC
  Administered 2021-03-06 – 2021-03-07 (×2): 700 mg via INTRAVENOUS
  Filled 2021-03-06 (×2): qty 14

## 2021-03-06 MED ORDER — INSULIN ASPART 100 UNIT/ML IJ SOLN
0.0000 [IU] | Freq: Three times a day (TID) | INTRAMUSCULAR | Status: DC
  Administered 2021-03-06 (×2): 3 [IU] via SUBCUTANEOUS
  Administered 2021-03-07: 1 [IU] via SUBCUTANEOUS
  Administered 2021-03-07: 3 [IU] via SUBCUTANEOUS

## 2021-03-06 NOTE — TOC Progression Note (Signed)
Transition of Care [facility]) - Progression Note    Patient Details  Name: [name] MRN: [id number] Date of Birth: 08-01-1956  Transition of Care [facility]) CM/SW Contact  Jacalyn Lefevre Edson Snowball, RN Phone Number: 03/06/2021, 12:18 PM  Clinical Narrative:     Discussed IV ABX at home. PAtient aware she will have HHRN with [facility] however that [facility] will not be present every time a dose is due. Prior to discharge Pam with [facility] will provide teaching to patient and a support person. Patient states her mother will assist her at home.   Pam with [facility] and Tommi Rumps with [facility] aware of home IV ABX.   Expected Discharge Plan: [place] Barriers to Discharge: Continued Medical Work up  Expected Discharge Plan and Services Expected Discharge Plan: [place] Choice: [place] arrangements for the past 2 months: Single Family Home                           HH Arranged: RN           Social Determinants of Health (SDOH) Interventions    Readmission Risk Interventions Readmission Risk Prevention Plan 02/06/2021 04/02/2019 02/18/2019  Transportation Screening Complete Complete Complete  Medication Review Press photographer) Complete Complete Complete  PCP or Specialist appointment within 3-5 days of discharge Complete Complete Complete  HRI or Home Care Consult Complete Complete Complete  SW Recovery Care/Counseling Consult Complete Patient refused Patient refused  Palliative Care Screening Not Applicable Not Applicable Not [place] Not Applicable Not Applicable Not Applicable  Some recent data might be hidden

## 2021-03-06 NOTE — Care Management Important Message (Signed)
Important Message  Patient Details  Name: SENTORIA BRENT MRN: [id number] Date of Birth: 1956-06-05   Medicare Important Message Given:  Yes     Barb Merino Versia Mignogna 03/06/2021, 12:52 PM

## 2021-03-06 NOTE — Procedures (Signed)
Interventional Radiology Procedure Note  Procedure: Power line placement  Indication: Osteomyelitis  Findings: Tip at cavoatrial junction.  Powerline ready for use. Please refer to procedural dictation for full description.  Complications: None  EBL: < 10 mL  Miachel Roux, MD [phone]

## 2021-03-06 NOTE — Progress Notes (Signed)
PROGRESS NOTE  [name] INO:[id number] DOB: 05-03-1956 DOA: 02/26/2021 PCP: [name], MD   LOS: 7 days   Brief Narrative / Interim history: 65 year old female with nonischemic cardiomyopathy/AICD in place, A. fib, DM 2, OSA, hypothyroidism, chronic kidney disease stage IV who recently had left foot osteomyelitis and was admitted last month status post bone resection of the fifth proximal phalanx and metatarsal and sesamoidectomy was discharged home on May 3.  At that time vascular was also consulted and she was status post angioplasty of 3 separate in-stent stenosis in the left superficial femoral artery.  Intraoperative cultures grew Enterococcus and staph aureus and capitis, all pansensitive, per ID she was narrowed to Augmentin.  She has had worsening discharge from her foot and came back to the hospital from her podiatrist office.  Podiatry consulted  Subjective / 24h Interval events: Denies any pain in her foot.  No chest pain, no abdominal pain, no nausea or vomiting  Assessment & Plan: Principal Problem Left foot cellulitis, concerning for osteomyelitis-patient was started on empiric antibiotics with vancomycin and cefepime, cultures were sent.  Podiatry consulted, she is status post ray amputation 5/27, tissue cultures sent intraoperatively, preliminary results show Staph capitis -Given positive on monitoring ID consulted, recommending 6 weeks of intravenous antibiotics -Given CKD stage IV would like to avoid PICC line, consulted IR for tunneled central line for home antibiotics, currently on vancomycin alone  Active Problems Peripheral arterial disease-she just recently had angioplasty few weeks ago.  Repeat arterial studies 5/27 showed a 50-9 9% stenosis on the distal left popliteal stent segment.  Vascular surgery consulted, no interventions for now, appreciate input.  Continue aspirin and Plavix  DM2-poorly controlled, with hyperglycemia most recent A1c 8.1.  Continue  Lantus, sliding scale.  Continue gabapentin for neuropathy.  CBGs are stable today  CBG (last 3)  Recent Labs    03/06/21 0413 03/06/21 0756 03/06/21 1124  GLUCAP 70 143* 131*   Paroxysmal A. fib, NSVT-currently on amiodarone, dual antiplatelet therapy which is now on hold.  Not sure the context of her prior A. fib episode but currently not on anticoagulation.  No active issues right now.  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 amiodarone  200 mg Oral Daily  . aspirin EC  81 mg Oral Daily  . atorvastatin  10 mg Oral Daily  . calcitRIOL  0.5 mcg Oral q morning  . carvedilol  3.125 mg Oral q morning  . clopidogrel  75 mg Oral Daily  . DULoxetine  30 mg Oral QHS  . ferrous sulfate  325 mg Oral q morning  . gabapentin  600 mg Oral QHS  . insulin aspart  0-9 Units Subcutaneous Q4H  . insulin glargine  22 Units Subcutaneous Q2200  . isosorbide mononitrate  30 mg Oral Daily  . levothyroxine  50 mcg Oral Q0600  . multivitamin with minerals  1 tablet Oral Daily  . Ensure Max Protein  11 oz Oral Daily   Continuous Infusions: . vancomycin 2,000 mg (03/05/21 0518)   PRN  Meds:.morphine injection, nitroGLYCERIN  Diet Orders (From admission, onward)    Start     Ordered   02/27/21 1554  Diet Carb Modified Fluid consistency: Thin; Room service appropriate? Yes  Diet effective now       Question Answer Comment  Diet-HS Snack? Nothing   Calorie Level Medium 1600-2000   Fluid  consistency: Thin   Room service appropriate? Yes      02/27/21 1553          DVT prophylaxis: SCDs Start: 02/27/21 0331     Code Status: Full Code  Family Communication: No family present at bedside  Status is: Inpatient  Remains inpatient appropriate because:Inpatient level of care appropriate due to severity of illness   Dispo: The patient is from: Home              Anticipated d/c is to: Home              Patient currently is not medically stable to d/c.   Difficult to place patient No   Level of care: Telemetry Medical  Consultants:  Podiatry  Procedures:  None   Microbiology  Blood cultures 5/23-no growth  Antimicrobials: Vancomycin 5/23 >> Cefepime 5/23 >> 5/31   Objective: Vitals:   03/05/21 1649 03/06/21 0019 03/06/21 0524 03/06/21 1125  BP: (!) 131/59 (!) 150/58 (!) 159/68 137/86  Pulse: 61 62 60 66  Resp: 18 18 18 18   Temp: 99.6 F (37.6 C) 98.5 F (36.9 C) 98.3 F (36.8 C) 99.3 F (37.4 C)  TempSrc: Oral Oral Oral Oral  SpO2: 99% 100% 99% 100%  Weight:      Height:        Intake/Output Summary (Last 24 hours) at 03/06/2021 1257 Last data filed at 03/06/2021 1000 Gross per 24 hour  Intake 600 ml  Output --  Net 600 ml   Filed Weights   02/27/21 0100 02/27/21 1729 03/02/21 1228  Weight: 130 kg 130 kg 130 kg    Examination:  Constitutional: NAD Eyes: No icterus ENMT: mmm Neck: normal, supple Respiratory: Clear bilaterally, no wheezing Cardiovascular: Regular rate and rhythm, no murmurs Abdomen: Soft, nontender, nondistended, positive bowel sounds Musculoskeletal: no clubbing / cyanosis.  Skin: No rashes seen Neurologic: Nonfocal, equal strength   Data Reviewed: I have independently reviewed following labs and imaging studies   CBC: Recent Labs  Lab 03/01/21 0309 03/02/21 0235 03/03/21 0131 03/04/21 0311 03/05/21 0121  WBC 5.7 6.4 6.8 8.4 6.8  HGB 10.7* 11.5* 10.6* 9.7* 9.2*  HCT 32.5* 34.9* 32.0* 29.7* 28.2*   MCV 92.9 91.8 92.5 93.4 93.7  PLT 148* 152 157 147* 762*   Basic Metabolic Panel: Recent Labs  Lab 03/02/21 0235 03/03/21 0131 03/04/21 0311 03/05/21 0121 03/06/21 0210  NA 138 135 136 136 136  K 3.9 5.0 4.1 4.1 3.9  CL 107 108 108 107 107  CO2 23 21* 23 20* 21*  GLUCOSE 234* 292* 83 174* 150*  BUN 28* 30* 46* 52* 42*  CREATININE 1.35* 1.39* 1.98* 1.85* 1.58*  CALCIUM 9.1 8.7* 8.6* 8.7* 8.9   Liver Function Tests: Recent Labs  Lab 03/03/21 0131  AST 16  ALT 14  ALKPHOS 55  BILITOT 0.4  PROT 6.1*  ALBUMIN 3.0*   Coagulation Profile: No results for input(s): INR, PROTIME in the last 168 hours. HbA1C: No results for input(s): HGBA1C in the last 72 hours. CBG: Recent Labs  Lab 03/05/21 2010 03/06/21 0019 03/06/21 0413 03/06/21 0756 03/06/21 1124  GLUCAP  195* 128* 70 143* 131*    Recent Results (from the past 240 hour(s))  Culture, blood (routine x 2)     Status: None   Collection Time: 02/26/21  7:00 PM   Specimen: BLOOD RIGHT ARM  Result Value Ref Range Status   Specimen Description BLOOD RIGHT ARM  Final   Special Requests   Final    BOTTLES DRAWN AEROBIC AND ANAEROBIC Blood Culture adequate volume   Culture   Final    NO GROWTH 5 DAYS Performed at [facility], [address] [place], [place] [address]    Report Status 03/03/2021 FINAL  Final  Culture, blood (routine x 2)     Status: None   Collection Time: 02/26/21  7:15 PM   Specimen: BLOOD LEFT ARM  Result Value Ref Range Status   Specimen Description BLOOD LEFT ARM  Final   Special Requests   Final    BOTTLES DRAWN AEROBIC ONLY Blood Culture results may not be optimal due to an excessive volume of blood received in culture bottles   Culture   Final    NO GROWTH 5 DAYS Performed at [facility], [place] [name] [place], [place] [address]    Report Status 03/03/2021 FINAL  Final  Resp Panel by RT-PCR (Flu A&B, Covid) Nasopharyngeal Swab     Status: None   Collection Time:  02/27/21  3:22 AM   Specimen: Nasopharyngeal Swab; Nasopharyngeal(NP) swabs in vial transport medium  Result Value Ref Range Status   SARS Coronavirus 2 by RT PCR NEGATIVE NEGATIVE Final    Comment: (NOTE) SARS-CoV-2 target nucleic acids are NOT DETECTED.  The SARS-CoV-2 RNA is generally detectable in upper respiratory specimens during the acute phase of infection. The lowest concentration of SARS-CoV-2 viral copies this assay can detect is 138 copies/mL. A negative result does not preclude SARS-Cov-2 infection and should not be used as the sole basis for treatment or other patient management decisions. A negative result may occur with  improper specimen collection/handling, submission of specimen other than nasopharyngeal swab, presence of viral mutation(s) within the areas targeted by this assay, and inadequate number of viral copies(<138 copies/mL). A negative result must be combined with clinical observations, patient history, and epidemiological information. The expected result is Negative.  Fact Sheet for Patients:  EntrepreneurPulse.com.au  Fact Sheet for Healthcare Providers:  IncredibleEmployment.be  This test is no t yet approved or cleared by the [place] FDA and  has been authorized for detection and/or diagnosis of SARS-CoV-2 by FDA under an Emergency Use Authorization (EUA). This EUA will remain  in effect (meaning this test can be used) for the duration of the COVID-19 declaration under Section 564(b)(1) of the Act, 21 U.S.C.section 360bbb-3(b)(1), unless the authorization is terminated  or revoked sooner.       Influenza A by PCR NEGATIVE NEGATIVE Final   Influenza B by PCR NEGATIVE NEGATIVE Final    Comment: (NOTE) The Xpert Xpress SARS-CoV-2/FLU/RSV plus assay is intended as an aid in the diagnosis of influenza from Nasopharyngeal swab specimens and should not be used as a sole basis for treatment. Nasal washings  and aspirates are unacceptable for Xpert Xpress SARS-CoV-2/FLU/RSV testing.  Fact Sheet for Patients: EntrepreneurPulse.com.au  Fact Sheet for Healthcare Providers: IncredibleEmployment.be  This test is not yet approved or cleared by the [place] FDA and has been authorized for detection and/or diagnosis of SARS-CoV-2 by FDA under an Emergency Use Authorization (EUA). This EUA will remain in effect (  meaning this test can be used) for the duration of the COVID-19 declaration under Section 564(b)(1) of the Act, 21 U.S.C. section 360bbb-3(b)(1), unless the authorization is terminated or revoked.  Performed at [facility], [place] [address] [place], [place] [address]   MRSA PCR Screening     Status: None   Collection Time: 03/01/21  9:00 PM   Specimen: Nasopharyngeal  Result Value Ref Range Status   MRSA by PCR NEGATIVE NEGATIVE Final    Comment:        The GeneXpert MRSA Assay (FDA approved for NASAL specimens only), is one component of a comprehensive MRSA colonization surveillance program. It is not intended to diagnose MRSA infection nor to guide or monitor treatment for MRSA infections. Performed at [facility], [place] [address] [place], [place] [address]   Aerobic/Anaerobic Culture w Gram Stain (surgical/deep wound)     Status: None (Preliminary result)   Collection Time: 03/02/21  2:10 PM   Specimen: Bone; Tissue  Result Value Ref Range Status   Specimen Description BONE LEFT TOE  Final   Special Requests BASE OF LEFT GREAT TOE  Final   Gram Stain   Final    RARE WBC PRESENT,BOTH PMN AND MONONUCLEAR NO ORGANISMS SEEN Performed at [facility], [place] [address] [place], [place] [address]    Culture   Final    RARE STAPHYLOCOCCUS CAPITIS SUSCEPTIBILITIES TO FOLLOW CRITICAL RESULT CALLED TO, READ BACK BY AND VERIFIED WITH: RN L.ILLERBRUN AT 0347 ON 03/05/2021 BY T.SAAD. NO ANAEROBES ISOLATED; CULTURE IN  PROGRESS FOR 5 DAYS    Report Status PENDING  Incomplete     Radiology Studies: No results found.  Marzetta Board, MD, PhD [facility]  Between 7 am - 7 pm I am available, please contact me via Amion (for emergencies) or Securechat (non urgent messages)  Between 7 pm - 7 am I am not available, please contact night coverage MD/APP via Amion

## 2021-03-06 NOTE — Progress Notes (Signed)
   VASCULAR SURGERY ASSESSMENT & PLAN:   PERIPHERAL VASCULAR DISEASE: This patient had undergone previous angioplasty and stenting of the left lower extremity in 2019 at [facility].  She had presented with dampened monophasic signals in the left foot and on 02/02/2021 she underwent CO2 arteriogram with angioplasty of 3 separate in-stent stenoses in the left superficial femoral artery.  She underwent ray amputation of the left great toe by [name] on 03/02/21.  The dressing was changed yesterday and reportedly this shows the amputation site healing well and the foot warm and well-perfused.  Noninvasive studies on 03/02/2021 show a toe pressure of 90 mmHg which should support adequate circulation for healing.  If the foot does not heal however we could consider arteriography to address a stenosis in the mid popliteal artery behind the knee.  This would be associated with increased risk as it extends below the knee joint but this could potentially be addressed with laser arthrectomy.  However we will only consider this if the wound is failing to heal.   SUBJECTIVE:   No complaints.  PHYSICAL EXAM:   Vitals:   03/06/21 0019 03/06/21 0524 03/06/21 1125 03/06/21 1557  BP: (!) 150/58 (!) 159/68 137/86 (!) 181/84  Pulse: 62 60 66 (!) 58  Resp: 18 18 18 18   Temp: 98.5 F (36.9 C) 98.3 F (36.8 C) 99.3 F (37.4 C) 98.1 F (36.7 C)  TempSrc: Oral Oral Oral Oral  SpO2: 100% 99% 100% 92%  Weight:      Height:       Monophasic but brisk posterior tibial, peroneal, and anterior tibial signal with a Doppler.  LABS:   Lab Results  Component Value Date   WBC 6.8 03/05/2021   HGB 9.2 (L) 03/05/2021   HCT 28.2 (L) 03/05/2021   MCV 93.7 03/05/2021   PLT 141 (L) 03/05/2021   Lab Results  Component Value Date   CREATININE 1.58 (H) 03/06/2021    CBG (last 3)  Recent Labs    03/06/21 0756 03/06/21 1124 03/06/21 1553  GLUCAP 143* 131* 206*    PROBLEM LIST:     Principal Problem:   Osteomyelitis (HCC) Active Problems:   Essential hypertension, benign   OSA (obstructive sleep apnea)   History of CVA (cerebrovascular accident)   Paroxysmal atrial fibrillation ([place])   DM type 2, uncontrolled, with renal complications (HCC)   CKD (chronic kidney disease) stage 4, GFR 15-29 ml/min (HCC)   Cellulitis of left foot   CURRENT MEDS:   . amiodarone  200 mg Oral Daily  . aspirin EC  81 mg Oral Daily  . atorvastatin  10 mg Oral Daily  . calcitRIOL  0.5 mcg Oral q morning  . carvedilol  3.125 mg Oral q morning  . Chlorhexidine Gluconate Cloth  6 each Topical Daily  . clopidogrel  75 mg Oral Daily  . DULoxetine  30 mg Oral QHS  . ferrous sulfate  325 mg Oral q morning  . gabapentin  600 mg Oral QHS  . insulin aspart  0-9 Units Subcutaneous TID AC & HS  . insulin glargine  22 Units Subcutaneous Q2200  . isosorbide mononitrate  30 mg Oral Daily  . levothyroxine  50 mcg Oral Q0600  . lidocaine      . multivitamin with minerals  1 tablet Oral Daily  . Ensure Max Protein  11 oz Oral Daily    Deitra Mayo Office: [phone] 03/06/2021

## 2021-03-06 NOTE — Progress Notes (Signed)
Bleeding to site above central line stopped with pressure x2. Thrombin pad applied as prophylactic. Thrombin spray held. Will continue to monitor site.

## 2021-03-06 NOTE — Consult Note (Addendum)
I have seen and examined the patient. I have personally reviewed the clinical findings, laboratory findings, microbiological data and imaging studies. The assessment and treatment plan was discussed with the  Advance Practice Provider, [name]  I agree with her/his recommendations except following additions/corrections.  65 Y O female with PMH of DM, PAD ( s/p angioplasty in the left superficial femoral artery in 4/21) with a left foot wound that seems to have started with a skin thickening  in January 2022 She is s/p several OR interventions by Podiatry - s/p I and D on 2/22 ( cultures positive for MSSA, given a course of cephalexin) s/p a course of clindamycin with no improvement in 4/9 followed by repeat I and D/biopsy of hallux and fifth metatarsal along with sesamoidectomy on 4/27, bone resection of the base of proximal phalanx and 5th metatarsal head and base of the 5th proximal phalanx with delayed primary closure on 4/30 ( discharged on Augmentin, cultures grew Amp S E faecalis,  Staph capitis and MSSA ) who is admitted with worsening erythema and drainage of left foot. She tells me she has been taking Augmentin since her last hospital admission   Labs/Imagings/Microbiology data reviewed.   She is S/p Left great toe amputation with concerns of remaining Osteomyelitis per Podiatry. OR cultures growing staph capitis( S pending ). Blood cultures 5/23 No growth in 5 days.   Afebrile and no leukocytosis   OK to place central line for long term IV access ( PICC vs tunneled line), Defer to primary based on GFR Will follow cultures peripherally and make changes as appropriate DC cefepime, continue Vancomycin (Pharmacy to check if Daptomycin is cost friendly for Outpatient) - Plan for 6 weeks Add metronidazole 539m [name] BID - plan for 2 weeks  Monitor CBC, BMP and Vancomycin trough  Fu ESR and CRP A follow up with [name] will be made upon discharge   SRosiland Oz MMoundfor  IEmisonfor Infectious Disease    Date of Admission:  02/26/2021     Total days of antibiotics 9     Reason for Consult: Osteomyelitis   Referring Provider: GCruzita LedererPrimary Care Provider: DTonia Ghent MD   ASSESSMENT:  Ms. [name] a 65y/o caucasian female with Type 2 diabetes complicated by development of a left foot wound ultimately resulting in osteomyelitis now s/p left great toe amputation with concern for residual infection. Previous tissue cultures with Enterococcus faecalis, Staphylococcus capitis, and MSSA. Most recent surgical cultures with Staphylococcus capitis with sensitivities pending. Will narrow antibiotics to Vancomycin. Continue to monitor renal function and vancomycin trough. Will need central line placement prior to discharge. Planning for 6 weeks of IV therapy with continued wound care per Podiatry. Discussed importance of blood sugar control to reduce risk of complicated healing and further infection. Will check on affordability of Daptomycin.   PLAN:  1. Continue vancomycin. 2. Discontinue cefepime. 3. Await sensitivities of surgical cultures.  4. Monitor renal function and vancomycin levels per protocol.  5. Central line placement prior to discharge. 6. Wound care per Podiatry.   Principal Problem:   Osteomyelitis ([place] Active Problems:   Essential hypertension, benign   OSA (obstructive sleep apnea)   History of CVA (cerebrovascular accident)   Paroxysmal atrial fibrillation (HCC)   DM type 2, uncontrolled, with renal complications (HCC)   CKD (chronic kidney disease) stage 4, GFR 15-29 ml/min (HCC)   Cellulitis of  left foot   . amiodarone  200 mg Oral Daily  . aspirin EC  81 mg Oral Daily  . atorvastatin  10 mg Oral Daily  . calcitRIOL  0.5 mcg Oral q morning  . carvedilol  3.125 mg Oral q morning  . clopidogrel  75 mg Oral Daily  . DULoxetine  30 mg Oral QHS  . ferrous sulfate   325 mg Oral q morning  . gabapentin  600 mg Oral QHS  . insulin aspart  0-9 Units Subcutaneous Q4H  . insulin glargine  22 Units Subcutaneous Q2200  . isosorbide mononitrate  30 mg Oral Daily  . levothyroxine  50 mcg Oral Q0600  . multivitamin with minerals  1 tablet Oral Daily  . Ensure Max Protein  11 oz Oral Daily     HPI: [name] is a 65 y.o. female with previous medical history of nonischemic cardiomyopathy s/p AICD, atrial fibrillation on anticoagulation, sleep apnea, and Type 2 diabetes complicated by left foot osteomyelitis s/p fifth proximal phalanx and metatarsal and sesamoidectomy admitted from [name] office with post-surgical site wound dehiscence.   [name] initially began with a callus on her left great toe in February. Since that time has undergone I&D on 2/22 with 10 days of Keflex followed by a second course on 3/14.  Imaging on 4/26 with concern for abscess at the base of the proximal phalanx of the great toe; osteomyelitis of the medial and lateral sesamoids and proximal phalanx; and osteomyelitis of the head of the fifth metatarsal and base of the proximal phalanx of the little toe. On 4/27 returned to OR for I&D with bone biopsy of the 1st metatarsal and 5th metatarsal. On 4/30 underwent I&D with internal amputation of the 1st and 5th ray. Treated with 14 day course of Augmentin started on 5/13. Subsequently developed dehiscence which was noted on follow up on 5/23 resulting in current admission. X-ray left foot 5/23 with post-surgical changes of the forefoot and CT left foot 5/24 with no definite cortical erosion or destruction. Underwent left great toe amputation on 5/27. Currently POD #4.   [name] has been afebrile since admission and WBC count without leukocytosis. Previous cultures from 02/03/21 with Enterococcus faecalis, Staphylococcus capitis, and MSSA. Most recent cultures from 5/27 with Staphylococcus capitis with sensitivity pending. Currently on  Day 9 of antimicrobial therapy with Vancomycin and Cefepime. Has received one dose of piperacillin-tazobactam on admission. Currently without pain. She lives with her 47 y/o mother. ID asked to provide antibiotic recommendations.   Review of Systems: Review of Systems  Constitutional: Negative for chills, fever and weight loss.  Respiratory: Negative for cough, shortness of breath and wheezing.   Cardiovascular: Negative for chest pain and leg swelling.  Gastrointestinal: Negative for abdominal pain, constipation, diarrhea, nausea and vomiting.  Skin: Negative for rash.     Past Medical History:  Diagnosis Date  . Acute myocardial infarction, subendocardial infarction, initial episode of care ([place]) 07/21/2012  . Acute osteomyelitis involving ankle and foot ([place]) 02/06/2015  . Acute systolic heart failure ([place]) 07/21/2012   New onset 07/19/12; admission to [facility] ED. Elevated Troponins.  S/p 2D-echo with EF 20-25%.  S/p cardiac catheterization with stenting LAD.  Repeat 2D-echo 10/2011 with improved EF of 35%.   . Anemia   . Automatic implantable cardioverter-defibrillator in situ    a. MDT CRT-D 06/2014, SN: [id number]  .removed in feb 2020  . CAD (coronary artery disease)    a. cardiac cath 101/04/2012:  PCI/DES to chronically occluded mLAD, consideration PCI to diag branch in 4 weeks.   . Cataract   . Chicken pox   . Chronic kidney disease   . Chronic systolic CHF (congestive heart failure) (HCC)    a. mixed ICM & NICM; b. EF 20-25% by echo 07/2012, mid-dist 2/3 of LV sev HK/AK, mild MR. echo 10/2012: EF 30-35%, sev HK ant-septal & inf walls, GR1DD, mild MR, PASP 33. c. echo 02/2013: EF 30%, GR1DD, mild MR. echo 04/2014: EF 30%, Septal-lat dyssynchrony, global HK, inf AK, GR1DD, mild MR. d. echo 10/2014: EF50-55%, WM nl, GR1DD, septal mild paradox. e. echo 02/2015: EF 50-55%, wm   . Depression   . Heart attack ([place])   . Heel ulcer ([place]) 04/27/2015  . History of blood transfusion ~ 2011    "plasma; had neuropathy; couldn't walk"  . Hypertension   . LBBB (left bundle branch block)   . Neuromuscular disorder ([place])   . Neuropathy 2011  . Obesity, unspecified   . OSA on CPAP    Moderate with AHI 23/hr and now on CPAP at 16cm H2O.  Her DME is [facility]  . Pure hypercholesterolemia   . Sepsis ([place]) 09/2018  . Type II diabetes mellitus ([place])   . Unspecified vitamin D deficiency     Social History   Tobacco Use  . Smoking status: Never Smoker  . Smokeless tobacco: Never Used  Vaping Use  . Vaping Use: Never used  Substance Use Topics  . Alcohol use: No    Alcohol/week: 0.0 standard drinks  . Drug use: No    Family History  Problem Relation Age of Onset  . Diabetes Mother   . Hypertension Mother   . Arthritis Mother        knees, lumbar DDD, cervical DDD  . Cancer Father        prostate,skin,lymphoma.  . Cancer Brother 21       bladder cancer; non-smoker  . Diabetes Maternal Grandmother   . Heart disease Maternal Grandmother   . COPD Maternal Grandmother   . Diabetes Paternal Grandmother   . Obesity Brother   . Diabetes Son   . Hypertension Son   . Cancer Maternal Grandfather   . Diabetes Paternal Grandfather     Allergies  Allergen Reactions  . Nsaids     Kidney disease    OBJECTIVE: Blood pressure 137/86, pulse 66, temperature 99.3 F (37.4 C), temperature source Oral, resp. rate 18, height 5' 8"  (1.727 m), weight 130 kg, SpO2 100 %.  Physical Exam Constitutional:      General: She is not in acute distress.    Appearance: She is well-developed.     Comments: Lying in bed with head of bed elevated; pleasant.   Cardiovascular:     Rate and Rhythm: Normal rate and regular rhythm.     Heart sounds: Normal heart sounds.  Pulmonary:     Effort: Pulmonary effort is normal.     Breath sounds: Normal breath sounds.  Musculoskeletal:     Comments: Surgical dressing and shoe present. No shadowing.   Skin:    General: Skin is warm and dry.  Neurological:      Mental Status: She is alert and oriented to person, place, and time.  Psychiatric:        Behavior: Behavior normal.        Thought Content: Thought content normal.        Judgment: Judgment normal.     Lab Results  Lab Results  Component Value Date   WBC 6.8 03/05/2021   HGB 9.2 (L) 03/05/2021   HCT 28.2 (L) 03/05/2021   MCV 93.7 03/05/2021   PLT 141 (L) 03/05/2021    Lab Results  Component Value Date   CREATININE 1.58 (H) 03/06/2021   BUN 42 (H) 03/06/2021   NA 136 03/06/2021   K 3.9 03/06/2021   CL 107 03/06/2021   CO2 21 (L) 03/06/2021    Lab Results  Component Value Date   ALT 14 03/03/2021   AST 16 03/03/2021   ALKPHOS 55 03/03/2021   BILITOT 0.4 03/03/2021     Microbiology: Recent Results (from the past 240 hour(s))  Culture, blood (routine x 2)     Status: None   Collection Time: 02/26/21  7:00 PM   Specimen: BLOOD RIGHT ARM  Result Value Ref Range Status   Specimen Description BLOOD RIGHT ARM  Final   Special Requests   Final    BOTTLES DRAWN AEROBIC AND ANAEROBIC Blood Culture adequate volume   Culture   Final    NO GROWTH 5 DAYS Performed at [facility], [place] [address], [place], [place] [address]    Report Status 03/03/2021 FINAL  Final  Culture, blood (routine x 2)     Status: None   Collection Time: 02/26/21  7:15 PM   Specimen: BLOOD LEFT ARM  Result Value Ref Range Status   Specimen Description BLOOD LEFT ARM  Final   Special Requests   Final    BOTTLES DRAWN AEROBIC ONLY Blood Culture results may not be optimal due to an excessive volume of blood received in culture bottles   Culture   Final    NO GROWTH 5 DAYS Performed at [facility], [place] [address], [place], [place] [address]    Report Status 03/03/2021 FINAL  Final  Resp Panel by RT-PCR (Flu A&B, Covid) Nasopharyngeal Swab     Status: None   Collection Time: 02/27/21  3:22 AM   Specimen: Nasopharyngeal Swab; Nasopharyngeal(NP) swabs in vial transport medium  Result  Value Ref Range Status   SARS Coronavirus 2 by RT PCR NEGATIVE NEGATIVE Final    Comment: (NOTE) SARS-CoV-2 target nucleic acids are NOT DETECTED.  The SARS-CoV-2 RNA is generally detectable in upper respiratory specimens during the acute phase of infection. The lowest concentration of SARS-CoV-2 viral copies this assay can detect is 138 copies/mL. A negative result does not preclude SARS-Cov-2 infection and should not be used as the sole basis for treatment or other patient management decisions. A negative result may occur with  improper specimen collection/handling, submission of specimen other than nasopharyngeal swab, presence of viral mutation(s) within the areas targeted by this assay, and inadequate number of viral copies(<138 copies/mL). A negative result must be combined with clinical observations, patient history, and epidemiological information. The expected result is Negative.  Fact Sheet for Patients:  EntrepreneurPulse.com.au  Fact Sheet for Healthcare Providers:  IncredibleEmployment.be  This test is no t yet approved or cleared by the [place] FDA and  has been authorized for detection and/or diagnosis of SARS-CoV-2 by FDA under an Emergency Use Authorization (EUA). This EUA will remain  in effect (meaning this test can be used) for the duration of the COVID-19 declaration under Section 564(b)(1) of the Act, 21 U.S.C.section 360bbb-3(b)(1), unless the authorization is terminated  or revoked sooner.       Influenza A by PCR NEGATIVE NEGATIVE Final   Influenza B by PCR  NEGATIVE NEGATIVE Final    Comment: (NOTE) The Xpert Xpress SARS-CoV-2/FLU/RSV plus assay is intended as an aid in the diagnosis of influenza from Nasopharyngeal swab specimens and should not be used as a sole basis for treatment. Nasal washings and aspirates are unacceptable for Xpert Xpress SARS-CoV-2/FLU/RSV testing.  Fact Sheet for  Patients: EntrepreneurPulse.com.au  Fact Sheet for Healthcare Providers: IncredibleEmployment.be  This test is not yet approved or cleared by the [place] FDA and has been authorized for detection and/or diagnosis of SARS-CoV-2 by FDA under an Emergency Use Authorization (EUA). This EUA will remain in effect (meaning this test can be used) for the duration of the COVID-19 declaration under Section 564(b)(1) of the Act, 21 U.S.C. section 360bbb-3(b)(1), unless the authorization is terminated or revoked.  Performed at [facility], [place] [address], [place], [place] [address]   MRSA PCR Screening     Status: None   Collection Time: 03/01/21  9:00 PM   Specimen: Nasopharyngeal  Result Value Ref Range Status   MRSA by PCR NEGATIVE NEGATIVE Final    Comment:        The GeneXpert MRSA Assay (FDA approved for NASAL specimens only), is one component of a comprehensive MRSA colonization surveillance program. It is not intended to diagnose MRSA infection nor to guide or monitor treatment for MRSA infections. Performed at [facility], [place] [address], [place], [place] [address]   Aerobic/Anaerobic Culture w Gram Stain (surgical/deep wound)     Status: None (Preliminary result)   Collection Time: 03/02/21  2:10 PM   Specimen: Bone; Tissue  Result Value Ref Range Status   Specimen Description BONE LEFT TOE  Final   Special Requests BASE OF LEFT GREAT TOE  Final   Gram Stain   Final    RARE WBC PRESENT,BOTH PMN AND MONONUCLEAR NO ORGANISMS SEEN    Culture   Final    RARE STAPHYLOCOCCUS CAPITIS CULTURE REINCUBATED FOR BETTER GROWTH NO ANAEROBES ISOLATED; CULTURE IN PROGRESS FOR 5 DAYS CRITICAL RESULT CALLED TO, READ BACK BY AND VERIFIED WITH: RN L.ILLERBRUN AT 9476 ON 03/05/2021 BY T.SAAD. Performed at [facility], [place] [address] [place], [place] [address]    Report Status PENDING  Incomplete   Pertinent Imagings CT Left  Foot 02/27/21 ( personally reviewed)  IMPRESSION: Postsurgical forefoot surgical changes with several fracture fragments along the first digit and fifth digit amputations with no definite cortical erosion or destruction. Associated subcutaneus soft tissue edema and emphysema in a patient with a soft tissue defect. Recommend MRI (with intravenous contrast if GFR greater than 30) for further evaluation of possible osteomyelitis.   Left Foot Xray 03/02/21 ( personally reviewed) FINDINGS: New postoperative changes of great toe amputation with residual proximal first metatarsal. Adjacent soft tissue swelling. Prior partial amputation of the fifth metatarsal with preserved distal phalanx and partially the middle phalanx, with unchanged small osseous fragments interposed between the bones. Prior second digit amputation. There is no evidence of new osseous erosion or destruction. There is plantar calcaneal spurring and calcification along the plantar fascia. Mild Achilles enthesophyte formation. Moderate midfoot degenerative arthritis.  IMPRESSION: New postoperative changes of first toe amputation with residual proximal first metatarsal. Adjacent soft tissue swelling. Unchanged unchanged appearance of the second toe and fifth metatarsal amputations. No other acute findings.  Terri Piedra, NP [facility]  03/06/2021  11:32 AM

## 2021-03-06 NOTE — Progress Notes (Signed)
Dr. Cruzita Lederer notified that the site above the central line has continued to bleed through 3 dressings and 2 rounds of holding pressure. We will try to apply thrombin pad to see if this will stop the bleeding.

## 2021-03-07 ENCOUNTER — Encounter (HOSPITAL_COMMUNITY): Payer: HMO

## 2021-03-07 ENCOUNTER — Inpatient Hospital Stay (HOSPITAL_COMMUNITY): Admit: 2021-03-07 | Payer: HMO

## 2021-03-07 ENCOUNTER — Encounter: Payer: Self-pay | Admitting: Internal Medicine

## 2021-03-07 ENCOUNTER — Other Ambulatory Visit (HOSPITAL_COMMUNITY): Payer: Self-pay

## 2021-03-07 DIAGNOSIS — M869 Osteomyelitis, unspecified: Secondary | ICD-10-CM | POA: Diagnosis not present

## 2021-03-07 LAB — BASIC METABOLIC PANEL
Anion gap: 12 (ref 5–15)
BUN: 37 mg/dL — ABNORMAL HIGH (ref 8–23)
CO2: 19 mmol/L — ABNORMAL LOW (ref 22–32)
Calcium: 9.2 mg/dL (ref 8.9–10.3)
Chloride: 108 mmol/L (ref 98–111)
Creatinine, Ser: 1.58 mg/dL — ABNORMAL HIGH (ref 0.44–1.00)
GFR, Estimated: 36 mL/min — ABNORMAL LOW (ref >60)
Glucose, Bld: 123 mg/dL — ABNORMAL HIGH (ref 70–99)
Potassium: 4.2 mmol/L (ref 3.5–5.1)
Sodium: 139 mmol/L (ref 135–145)

## 2021-03-07 LAB — GLUCOSE, CAPILLARY
Glucose-Capillary: 121 mg/dL — ABNORMAL HIGH (ref 70–99)
Glucose-Capillary: 216 mg/dL — ABNORMAL HIGH (ref 70–99)
Glucose-Capillary: 161 mg/dL — ABNORMAL HIGH (ref 70–99)

## 2021-03-07 LAB — CBC
HCT: 32.6 % — ABNORMAL LOW (ref 36.0–46.0)
Hemoglobin: 10.8 g/dL — ABNORMAL LOW (ref 12.0–15.0)
MCH: 30.3 pg (ref 26.0–34.0)
MCHC: 33.1 g/dL (ref 30.0–36.0)
MCV: 91.3 fL (ref 80.0–100.0)
Platelets: 180 10*3/uL (ref 150–400)
RBC: 3.57 MIL/uL — ABNORMAL LOW (ref 3.87–5.11)
RDW: 13.6 % (ref 11.5–15.5)
WBC: 7.7 10*3/uL (ref 4.0–10.5)
nRBC: 0 % (ref 0.0–0.2)

## 2021-03-07 LAB — AEROBIC/ANAEROBIC CULTURE W GRAM STAIN (SURGICAL/DEEP WOUND)

## 2021-03-07 MED ORDER — DAPTOMYCIN IV (FOR PTA / DISCHARGE USE ONLY): 700.0000 mg | INTRAVENOUS | 0 refills | Status: AC

## 2021-03-07 MED ORDER — METRONIDAZOLE 500 MG PO TABS
500.0000 mg | ORAL_TABLET | Freq: Two times a day (BID) | ORAL | Status: DC
  Administered 2021-03-07: 500 mg via ORAL
  Filled 2021-03-07: qty 1

## 2021-03-07 MED ORDER — ANTICOAGULANT SODIUM CITRATE 4% (200MG/5ML) IV SOLN
2.5000 mL | Status: AC | PRN
  Administered 2021-03-07: 2.5 mL
  Filled 2021-03-07: qty 5
  Filled 2021-03-07: qty 2.5

## 2021-03-07 MED ORDER — SODIUM CHLORIDE 0.9% FLUSH
10.0000 mL | Freq: Two times a day (BID) | INTRAVENOUS | Status: DC
  Administered 2021-03-07: 10 mL

## 2021-03-07 MED ORDER — METRONIDAZOLE 500 MG PO TABS
500.0000 mg | ORAL_TABLET | Freq: Two times a day (BID) | ORAL | 0 refills | Status: DC
  Filled 2021-03-07: qty 28, 14d supply, fill #0

## 2021-03-07 MED ORDER — ATORVASTATIN CALCIUM 10 MG PO TABS: 10.0000 mg | ORAL_TABLET | Freq: Every day | ORAL | 11 refills | Status: DC

## 2021-03-07 MED ORDER — SODIUM CHLORIDE 0.9% FLUSH: 10.0000 mL | INTRAVENOUS | Status: DC | PRN

## 2021-03-07 NOTE — Discharge Summary (Signed)
Physician Discharge Summary  [name] URK:[id number] DOB: [birth year] DOA: 02/26/2021  PCP: [name], MD  Admit date: 02/26/2021 Discharge date: 03/07/2021  Admitted From: Home Discharge disposition: Home   Recommendations for Outpatient Follow-Up:   Home health for IV antibiotics Will need weekly CBC with differential, BMP, CRP, ESR, CK Patient will need to return to the hospital for removal of tunneled catheter upon completion of antibiotics  Discharge Diagnosis:   Principal Problem:   Osteomyelitis ([place]) Active Problems:   Essential hypertension, benign   OSA (obstructive sleep apnea)   History of CVA (cerebrovascular accident)   Paroxysmal atrial fibrillation ([place])   DM type 2, uncontrolled, with renal complications (HCC)   CKD (chronic kidney disease) stage 4, GFR 15-29 ml/min (HCC)   Cellulitis of left foot    Discharge Condition: Improved.  Diet recommendation: Low sodium, heart healthy.  Carbohydrate-modified.   Wound care: See below  Code status: Full.   History of Present Illness:   65 year old female with nonischemic cardiomyopathy/AICD in place, A. fib, DM 2, OSA, hypothyroidism, chronic kidney disease stage IV who recently had left foot osteomyelitis and was admitted last month status post bone resection of the fifth proximal phalanx and metatarsal and sesamoidectomy was discharged home on May 3.  At that time vascular was also consulted and she was status post angioplasty of 3 separate in-stent stenosis in the left superficial femoral artery.  Intraoperative cultures grew Enterococcus and staph aureus and capitis, all pansensitive, per ID she was narrowed to Augmentin.  She has had worsening discharge from her foot and came back to the hospital from her podiatrist office.  Podiatry consulted   Hospital Course by Problem:   Left foot cellulitis, concerning for osteomyelitis-patient was started on empiric antibiotics with vancomycin and  cefepime, cultures were sent.  Podiatry consulted, she is status post ray amputation 5/27, tissue cultures sent intraoperatively, preliminary results show Staph capitis - ID consulted, recommending 6 weeks of intravenous antibiotics -Given CKD stage IV would like to avoid PICC line, consulted IR for tunneled central line for home antibiotics -Daptomycin for total of 6 weeks; end date of 7/8 -P.o. Flagyl for 2 weeks  Peripheral arterial disease-she just recently had angioplasty few weeks ago.  Repeat arterial studies 5/27 showed a 50-9 9% stenosis on the distal left popliteal stent segment.  Vascular surgery consulted, no interventions for now, appreciate input.  Continue aspirin and Plavix  DM2-poorly controlled, with hyperglycemia most recent A1c 8.1.   -Resume home meds  Paroxysmal A. fib, NSVT-currently on amiodarone, dual antiplatelet therapy which is now on hold.  Not sure the context of her prior A. fib episode but currently not on anticoagulation.  No active issues right now.  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 -in 2020 her EF was 10-15%, now has a CRT and most recent 2D echo December 2021 showed EF 50-55%  Hypothyroidism-continue Synthroid  OSA-continue CPAP  Anemia of chronic disease-hemoglobin stable -Follow-up outpatient      Medical Consultants:   Podiatry ID Vascular   Discharge Exam:   Vitals:   03/07/21 0043 03/07/21 0512  BP: (!) 144/64 (!) 149/61  Pulse: 61 63  Resp: 18 18  Temp: 98.3 F (36.8 C) 97.8 F (36.6 C)  SpO2: 98% 99%   Vitals:   03/06/21 1125 03/06/21 1557 03/07/21 0043 03/07/21 0512  BP: 137/86 (!) 181/84 (!) 144/64 (!) 149/61  Pulse: 66 (!) 58 61 63  Resp: _0 Temp: 99.3 F (37.4 C) 98.1 F (36.7 C) 98.3 F (36.8 C) 97.8 F (36.6 C)  TempSrc: Oral Oral Oral Oral  SpO2: 100% 92% 98% 99%  Weight:      Height:        General exam: Appears calm and comfortable.   The results of significant diagnostics from this hospitalization (including imaging, microbiology, ancillary and laboratory) are listed below for reference.     Procedures and Diagnostic Studies:   CT FOOT LEFT WO CONTRAST  Result Date: 02/27/2021 CLINICAL DATA:  Osteomyelitis of foot. Recent left foot surgery 4 weeks ago. EXAM: CT OF THE LEFT FOOT WITHOUT CONTRAST TECHNIQUE: Multidetector CT imaging of the left foot was performed according to the standard protocol. Multiplanar CT image reconstructions were also generated. COMPARISON:  X-ray left foot 02/26/2021 FINDINGS: Bones/Joint/Cartilage Partially amputation of the first digit proximal phalanx. Indication of second digit phalanges. Amputation of the proximal phalanx and partial metatarsal of the fifth digit. Cortical irregularity with several fracture fragments along the first digit and fifth digit amputations with no definite cortical erosion or destruction. No acute displaced fracture or dislocation. Ligaments Suboptimally assessed by CT. Muscles and Tendons Atrophic. Soft tissues Subcutaneus soft tissue edema as well as couple foci of soft tissue emphysema along the first digit metatarsal head. Edema fifth digit. Soft tissue defect of the forefoot. IMPRESSION: Postsurgical forefoot surgical changes with several fracture fragments along the first digit and fifth digit amputations with no definite cortical erosion or destruction. Associated subcutaneus soft tissue edema and emphysema in a patient with a soft tissue defect. Recommend MRI (with intravenous contrast if GFR greater than 30) for further evaluation of possible osteomyelitis. Electronically Signed   By: Iven Finn M.D.   On: 02/27/2021 04:38   DG Foot Complete  Left  Result Date: 02/26/2021 CLINICAL DATA:  65 year old female with concern for infection EXAM: LEFT FOOT - COMPLETE 3+ VIEW COMPARISON:  Left foot radiograph dated 02/03/2021. FINDINGS: Status post prior amputation of proximal half of the proximal phalanx of the great toe and distal half of the fifth metatarsal as well as amputation of the phalanges of the second toe. There is slight fragmentation of the amputated edge of the proximal phalanx of the great toe. No acute fracture or dislocation. The bones are osteopenic. There is soft tissue swelling of the forefoot. No radiopaque foreign object or soft tissue gas. IMPRESSION: 1. Postsurgical changes of the forefoot as seen on the prior radiograph. No acute fracture or dislocation. MRI or a white blood cell nuclear scan may provide better evaluation if there is high clinical concern for acute osteomyelitis. 2. Diffuse soft tissue swelling of the forefoot. No soft tissue gas. Electronically Signed   By: Anner Crete M.D.   On: 02/26/2021 20:12     Labs:   Basic Metabolic Panel: Recent Labs  Lab 03/03/21 0131 03/04/21 0311 03/05/21 0121 03/06/21 0210 03/07/21 0322  NA 135 136 136 136 139  K 5.0 4.1 4.1 3.9 4.2  CL 108 108 107 107 108  CO2 21* 23 20* 21* 19*  GLUCOSE 292* 83 174* 150* 123*  BUN 30* 46* 52* 42* 37*  CREATININE 1.39* 1.98* 1.85* 1.58* 1.58*  CALCIUM 8.7* 8.6* 8.7* 8.9 9.2   GFR Estimated Creatinine  Clearance: 49.3 mL/min (A) (by C-G formula based on SCr of 1.58 mg/dL (H)). Liver Function Tests: Recent Labs  Lab 03/03/21 0131  AST 16  ALT 14  ALKPHOS 55  BILITOT 0.4  PROT 6.1*  ALBUMIN 3.0*   No results for input(s): LIPASE, AMYLASE in the last 168 hours. No results for input(s): AMMONIA in the last 168 hours. Coagulation profile No results for input(s): INR, PROTIME in the last 168 hours.  CBC: Recent Labs  Lab 03/02/21 0235 03/03/21 0131 03/04/21 0311 03/05/21 0121 03/07/21 0322  WBC 6.4 6.8 8.4  6.8 7.7  HGB 11.5* 10.6* 9.7* 9.2* 10.8*  HCT 34.9* 32.0* 29.7* 28.2* 32.6*  MCV 91.8 92.5 93.4 93.7 91.3  PLT 152 157 147* 141* 180   Cardiac Enzymes: No results for input(s): CKTOTAL, CKMB, CKMBINDEX, TROPONINI in the last 168 hours. BNP: Invalid input(s): POCBNP CBG: Recent Labs  Lab 03/06/21 1553 03/06/21 2005 03/06/21 2155 03/07/21 0611 03/07/21 1106  GLUCAP 206* 255* 201* 121* 216*   D-Dimer No results for input(s): DDIMER in the last 72 hours. Hgb A1c No results for input(s): HGBA1C in the last 72 hours. Lipid Profile No results for input(s): CHOL, HDL, LDLCALC, TRIG, CHOLHDL, LDLDIRECT in the last 72 hours. Thyroid function studies No results for input(s): TSH, T4TOTAL, T3FREE, THYROIDAB in the last 72 hours.  Invalid input(s): FREET3 Anemia work up No results for input(s): VITAMINB12, FOLATE, FERRITIN, TIBC, IRON, RETICCTPCT in the last 72 hours. Microbiology Recent Results (from the past 240 hour(s))  Culture, blood (routine x 2)     Status: None   Collection Time: 02/26/21  7:00 PM   Specimen: BLOOD RIGHT ARM  Result Value Ref Range Status   Specimen Description BLOOD RIGHT ARM  Final   Special Requests   Final    BOTTLES DRAWN AEROBIC AND ANAEROBIC Blood Culture adequate volume   Culture   Final    NO GROWTH 5 DAYS Performed at [facility], [address], [place], [place] [address]    Report Status 03/03/2021 FINAL  Final  Culture, blood (routine x 2)     Status: None   Collection Time: 02/26/21  7:15 PM   Specimen: BLOOD LEFT ARM  Result Value Ref Range Status   Specimen Description BLOOD LEFT ARM  Final   Special Requests   Final    BOTTLES DRAWN AEROBIC ONLY Blood Culture results may not be optimal due to an excessive volume of blood received in culture bottles   Culture   Final    NO GROWTH 5 DAYS Performed at [facility], [place] [address], [place], [place] [address]    Report Status 03/03/2021 FINAL  Final  Resp Panel by RT-PCR  (Flu A&B, Covid) Nasopharyngeal Swab     Status: None   Collection Time: 02/27/21  3:22 AM   Specimen: Nasopharyngeal Swab; Nasopharyngeal(NP) swabs in vial transport medium  Result Value Ref Range Status   SARS Coronavirus 2 by RT PCR NEGATIVE NEGATIVE Final    Comment: (NOTE) SARS-CoV-2 target nucleic acids are NOT DETECTED.  The SARS-CoV-2 RNA is generally detectable in upper respiratory specimens during the acute phase of infection. The lowest concentration of SARS-CoV-2 viral copies this assay can detect is 138 copies/mL. A negative result does not preclude SARS-Cov-2 infection and should not be used as the sole basis for treatment or other patient management decisions. A negative result may occur with  improper specimen collection/handling, submission of specimen other than nasopharyngeal swab,  presence of viral mutation(s) within the areas targeted by this assay, and inadequate number of viral copies(<138 copies/mL). A negative result must be combined with clinical observations, patient history, and epidemiological information. The expected result is Negative.  Fact Sheet for Patients:  EntrepreneurPulse.com.au  Fact Sheet for Healthcare Providers:  IncredibleEmployment.be  This test is no t yet approved or cleared by the [place] FDA and  has been authorized for detection and/or diagnosis of SARS-CoV-2 by FDA under an Emergency Use Authorization (EUA). This EUA will remain  in effect (meaning this test can be used) for the duration of the COVID-19 declaration under Section 564(b)(1) of the Act, 21 U.S.C.section 360bbb-3(b)(1), unless the authorization is terminated  or revoked sooner.       Influenza A by PCR NEGATIVE NEGATIVE Final   Influenza B by PCR NEGATIVE NEGATIVE Final    Comment: (NOTE) The Xpert Xpress SARS-CoV-2/FLU/RSV plus assay is intended as an aid in the diagnosis of influenza from Nasopharyngeal swab specimens  and should not be used as a sole basis for treatment. Nasal washings and aspirates are unacceptable for Xpert Xpress SARS-CoV-2/FLU/RSV testing.  Fact Sheet for Patients: EntrepreneurPulse.com.au  Fact Sheet for Healthcare Providers: IncredibleEmployment.be  This test is not yet approved or cleared by the [place] FDA and has been authorized for detection and/or diagnosis of SARS-CoV-2 by FDA under an Emergency Use Authorization (EUA). This EUA will remain in effect (meaning this test can be used) for the duration of the COVID-19 declaration under Section 564(b)(1) of the Act, 21 U.S.C. section 360bbb-3(b)(1), unless the authorization is terminated or revoked.  Performed at [facility], [place] [address], [place], [place] [address]   MRSA PCR Screening     Status: None   Collection Time: 03/01/21  9:00 PM   Specimen: Nasopharyngeal  Result Value Ref Range Status   MRSA by PCR NEGATIVE NEGATIVE Final    Comment:        The GeneXpert MRSA Assay (FDA approved for NASAL specimens only), is one component of a comprehensive MRSA colonization surveillance program. It is not intended to diagnose MRSA infection nor to guide or monitor treatment for MRSA infections. Performed at [facility], [place] [address], [place], [place] [address]   Aerobic/Anaerobic Culture w Gram Stain (surgical/deep wound)     Status: None (Preliminary result)   Collection Time: 03/02/21  2:10 PM   Specimen: Bone; Tissue  Result Value Ref Range Status   Specimen Description BONE LEFT TOE  Final   Special Requests BASE OF LEFT GREAT TOE  Final   Gram Stain   Final    RARE WBC PRESENT,BOTH PMN AND MONONUCLEAR NO ORGANISMS SEEN Performed at [facility], [place] [address] [place], [place] [address]    Culture   Final    RARE STAPHYLOCOCCUS CAPITIS SUSCEPTIBILITIES TO FOLLOW CRITICAL RESULT CALLED TO, READ BACK BY AND VERIFIED WITH: RN L.ILLERBRUN AT  4536 ON 03/05/2021 BY T.SAAD. NO ANAEROBES ISOLATED; CULTURE IN PROGRESS FOR 5 DAYS    Report Status PENDING  Incomplete     Discharge Instructions:   Discharge Instructions    [facility] pharmacist to adjust dose for Vancomycin, Aminoglycosides and other anti-infective therapies as requested by physician.   Complete by: As directed    [facility] to provide Cath Flo 72m   Complete by: As directed    Administer for PICC line occlusion and as ordered by physician for other access device issues.  Anaphylaxis Kit: Provided to treat any anaphylactic reaction to the medication being provided to the patient if First Dose or when requested by physician   Complete by: As directed    Epinephrine 68m/ml vial / amp: Administer 0.378m(0.42m29msubcutaneously once for moderate to severe anaphylaxis, nurse to call physician and pharmacy when reaction occurs and call 911 if needed for immediate care   Diphenhydramine 3m18m IV vial: Administer 25-3mg34mIM PRN for first dose reaction, rash, itching, mild reaction, nurse to call physician and pharmacy when reaction occurs   Sodium Chloride 0.9% NS 500ml 58mAdminister if needed for hypovolemic blood pressure drop or as ordered by physician after call to physician with anaphylactic reaction   Change dressing on IV access line weekly and PRN   Complete by: As directed    Diet Carb Modified   Complete by: As directed    Discharge wound care:   Complete by: As directed    Gently cleanse left lateral lower leg wound with soap and water, pat dry. Place a Xeroform gauze over the wound, secure with kerlex. Change daily.   Flush IV access with Sodium Chloride 0.9% and Heparin 10 units/ml or 100 units/ml   Complete by: As directed    Home infusion instructions - [facility]   Complete by: As directed    Instructions: Flush IV access with Sodium Chloride 0.9% and Heparin 10units/ml or 100units/ml   Change dressing on IV access  line: Weekly and PRN   Instructions Cath Flo 2mg: A42mnister for PICC Line occlusion and as ordered by physician for other access device   [facility] pharmacist to adjust dose for: Vancomycin, Aminoglycosides and other anti-infective therapies as requested by physician   Increase activity slowly   Complete by: As directed    Method of administration may be changed at the discretion of home infusion pharmacist based upon assessment of the patient and/or caregiver's ability to self-administer the medication ordered   Complete by: As directed      Allergies as of 03/07/2021      Reactions   Nsaids    Kidney disease      Medication List    STOP taking these medications   amoxicillin-clavulanate 500-125 MG tablet Commonly known as: AUGMENTIN   amoxicillin-clavulanate 875-125 MG tablet Commonly known as: AUGMENTIN   torsemide 20 MG tablet Commonly known as: DEMADEX     TAKE these medications   amiodarone 200 MG tablet Commonly known as: PACERONE Take 1 tablet (200 mg total) by mouth daily.   aspirin 81 MG EC tablet Take 1 tablet (81 mg total) by mouth daily. Swallow whole.   atorvastatin 10 MG tablet Commonly known as: LIPITOR Take 1 tablet (10 mg total) by mouth daily. Start taking on: April 16, 2021 What changed: These instructions start on April 16, 2021. If you are unsure what to do until then, ask your doctor or other care provider.   calcitRIOL 0.5 MCG capsule Commonly known as: ROCALTROL Take 0.5 mcg by mouth every morning.   Calcium Carbonate-Vitamin D3 600-400 MG-UNIT Tabs Take 1 tablet by mouth 2 (two) times a day.   carvedilol 3.125 MG tablet Commonly known as: COREG Take 3.125 mg by mouth every morning.   clopidogrel 75 MG tablet Commonly known as: PLAVIX TAKE 1 TABLET BY MOUTH ONCE DAILY   daptomycin  IVPB Commonly known as: CUBICIN Inject 700 mg into the vein daily. Indication:  Osteomyelitis  First Dose: Yes Last Day of Therapy:  04/13/21 Labs - Once weekly:  CBC/D, BMP, and CPK Labs - Every other week:  ESR and CRP Method of administration: IV Push Method of administration may be changed at the discretion of home infusion pharmacist based upon assessment of the patient and/or caregiver's ability to self-administer the medication ordered.   DULoxetine 30 MG capsule Commonly known as: Cymbalta Take 1 capsule (30 mg total) by mouth at bedtime.   ferrous sulfate 325 (65 FE) MG tablet Take 325 mg by mouth every morning.   Fish Oil 1000 MG Caps Take 1,000 mg by mouth 2 (two) times a day.   gabapentin 600 MG tablet Commonly known as: NEURONTIN Take 1 tablet (600 mg total) by mouth at bedtime.   isosorbide mononitrate 30 MG 24 hr tablet Commonly known as: IMDUR Take 1 tablet (30 mg total) by mouth daily. Needs appt   Lantus SoloStar 100 UNIT/ML Solostar Pen Generic drug: insulin glargine INJECT 22 UNITS UNDER THE SKIN DAILY. What changed: See the new instructions.   levothyroxine 50 MCG tablet Commonly known as: SYNTHROID TAKE 1 TABLET BY MOUTH ONCE DAILY BEFOREBREAKFAST What changed: See the new instructions.   metroNIDAZOLE 500 MG tablet Commonly known as: FLAGYL Take 1 tablet (500 mg total) by mouth every 12 (twelve) hours.   multivitamin with minerals Tabs tablet Take 1 tablet by mouth every morning.   nitroGLYCERIN 0.4 MG SL tablet Commonly known as: NITROSTAT Place 1 tablet (0.4 mg total) under the tongue every 5 (five) minutes as needed for chest pain.   NovoLOG FlexPen 100 UNIT/ML FlexPen Generic drug: insulin aspart Inject 5-10 Units into the skin 3 (three) times daily before meals. Sugar 100-149 - 5 units.  150-199- 6 units.  200-249- 7 units.  250-299- 8 units. 300-349- 9 units.  350+ 10 units.   Pen Needles 32G X 6 MM Misc 1 each by Does not apply route 4 (four) times daily.   traMADol 50 MG tablet Commonly known as: ULTRAM Take 1-2 tablets (50-100 mg total) by mouth at bedtime as  needed. What changed: reasons to take this            Discharge Care Instructions  (From admission, onward)         Start     Ordered   03/07/21 0000  Change dressing on IV access line weekly and PRN  (Home infusion instructions - [facility] )        03/07/21 1151   03/07/21 0000  Discharge wound care:       Comments: Gently cleanse left lateral lower leg wound with soap and water, pat dry. Place a Xeroform gauze over the wound, secure with kerlex. Change daily.   03/07/21 1151          Follow-up Information    [facility] Follow up.   Specialty: Home Health Services Contact information: [place] [address] [phone]        [name], MD Follow up in 1 week(s).   Specialty: Family Medicine Contact information: [place] [place] [address] [phone]        Minna Merritts, MD .   Specialty: Cardiology Contact information: [place] [address] [phone]        Deboraha Sprang, MD .   Specialty: Cardiology Contact information: [address] [place] [address] [phone]  Bensimhon, Shaune Pascal, MD .   Specialty: Cardiology Contact information: [address] [place] [address] [phone]                Time coordinating discharge:  35 min  Signed:  Geradine Girt DO  [facility] 03/07/2021, 11:51 AM

## 2021-03-07 NOTE — Progress Notes (Signed)
Diagnosis: Osteomyelitis   Culture Result: Staph capitis   Allergies  Allergen Reactions  . Nsaids     Kidney disease    OPAT Orders Discharge antibiotics to be given via PICC line Discharge antibiotics: Daptomycin  Per pharmacy protocol  Aim for Vancomycin trough 15-20 or AUC 400-550 (unless otherwise indicated) Duration: 6 weeks  End Date: 04/13/21  [facility] Care Per Protocol:  Home health RN for IV administration and teaching; PICC line care and labs.    Labs weekly while on IV antibiotics: X__ CBC with differential X__ BMP __ CMP X__ CRP X__ ESR __ Vancomycin trough X__ CK  X__ Please pull PIC at completion of IV antibiotics __ Please leave PIC in place until doctor has seen patient or been notified  Fax weekly labs to [phone]  Clinic Follow Up Appt: 03/30/21  _0 :45 am with Dr West Bali  Rosiland Oz, MD Infectious Disease Physician [facility] [address] [place], [place] [address] Phone: [phone]  Fax: [phone]

## 2021-03-07 NOTE — Progress Notes (Signed)
Physical Therapy Treatment Patient Details Name: [name] MRN: [id number] DOB: Oct 01, 1956 Today's Date: 03/07/2021    History of Present Illness The pt is a 65 yo female presenting 4/24 due to non-healing wound on L foot after 3 weeks of antibiotics. Pt found to have dampened signals in the L foot and underwent arteriography with stant placement in superficial femoral artery on 4/29. I&D of L foot wound on 4/30. PMH includes: MI, osteomyelitis of foot and ankle, CAD, CKD, CHF, HTN, neuropathy, obesity, DM II, multiple toe amputations, and heart caths, and biventricular cardioverter defibrillator implantation.    PT Comments    Pt received in chair, agreeable to therapy session and with good participation and tolerance for stair and gait progression. Pt performed household distance gait trial with Supervision/RW and stairs with up to min guard and good recall of sequencing for stair safety from previous admission training. Reviewed supine/seated HEP and encouraged her to continue this per previous handout. VSS on RA. Pt continues to benefit from PT services to progress toward functional mobility goals. Anticipate pt safe to DC home with PRN supervision/assist from mother once medically cleared.   Follow Up Recommendations  No PT follow up;Supervision - Intermittent     Equipment Recommendations  None recommended by PT    Recommendations for Other Services       Precautions / Restrictions Precautions Precautions: Fall Required Braces or Orthoses: Other Brace (cam walker shoe) Restrictions Weight Bearing Restrictions: Yes LLE Weight Bearing: Partial weight bearing Other Position/Activity Restrictions: emphasis on WB through heel    Mobility  Bed Mobility Overal bed mobility: Modified Independent             General bed mobility comments: OOB in the chair on arrival    Transfers Overall transfer level: Needs assistance Equipment used: Rolling walker (2  wheeled) Transfers: Sit to/from Stand Sit to Stand: Supervision         General transfer comment: cues needed for hand placement/not to plop  Ambulation/Gait Ambulation/Gait assistance: Supervision Gait Distance (Feet): 85 Feet Assistive device: Rolling walker (2 wheeled) Gait Pattern/deviations: Step-to pattern Gait velocity: Decreased   General Gait Details: step to pattern. Unsure of ability to maintain precautions as pt with foot drop on the L but compliant with step-to pattern throughout. Pt did report she was keeping weight on heel of foot.   Stairs Stairs: Yes Stairs assistance: Min guard Stair Management: With walker;Forwards;Step to pattern Number of Stairs: 3 General stair comments: cues for sequencing with visual demo first, pt with good carryover of instruction, reports she will have B handrails she can reach; her mother also present in room and receptive to instruction   Wheelchair Mobility    Modified Rankin (Stroke Patients Only)       Balance Overall balance assessment: Needs assistance Sitting-balance support: No upper extremity supported Sitting balance-Leahy Scale: Good     Standing balance support: Bilateral upper extremity supported Standing balance-Leahy Scale: Fair Standing balance comment: reliant on BUE support due to PWB restriction, no LOB                            Cognition Arousal/Alertness: Awake/alert Behavior During Therapy: WFL for tasks assessed/performed Overall Cognitive Status: Within Functional Limits for tasks assessed  Exercises Other Exercises Other Exercises: encouraged ankle pumps, heel slides, hip abduction in supine and seated hip flexion/LAQ seated 3x/day    General Comments General comments (skin integrity, edema, etc.): SpO2 100% on RA, HR 62 bpm; BP 112/56 (72) prior to exertion and no dizziness reported      Pertinent Vitals/Pain Pain  Assessment: No/denies pain    Home Living                      Prior Function            PT Goals (current goals can now be found in the care plan section) Acute Rehab PT Goals Patient Stated Goal: to go home PT Goal Formulation: With patient Time For Goal Achievement: 03/16/21 Potential to Achieve Goals: Good Progress towards PT goals: Progressing toward goals    Frequency    Min 3X/week      PT Plan Current plan remains appropriate;Equipment recommendations need to be updated    Co-evaluation              AM-PAC PT "6 Clicks" Mobility   Outcome Measure  Help needed turning from your back to your side while in a flat bed without using bedrails?: None Help needed moving from lying on your back to sitting on the side of a flat bed without using bedrails?: None Help needed moving to and from a bed to a chair (including a wheelchair)?: A Little Help needed standing up from a chair using your arms (e.g., wheelchair or bedside chair)?: A Little Help needed to walk in hospital room?: A Little Help needed climbing 3-5 steps with a railing? : A Little 6 Click Score: 20    End of Session Equipment Utilized During Treatment: Gait belt Activity Tolerance: Patient tolerated treatment well Patient left: in chair;with call bell/phone within reach;with family/visitor present Nurse Communication: Mobility status PT Visit Diagnosis: Other abnormalities of gait and mobility (R26.89);Difficulty in walking, not elsewhere classified (R26.2)     Time: 8119-1478 PT Time Calculation (min) (ACUTE ONLY): 24 min  Charges:  $Gait Training: 23-37 mins                     Teyah Rossy P., PTA Acute Rehabilitation Services Pager: [phone] Office: [phone]   [place] 03/07/2021, 1:34 PM

## 2021-03-07 NOTE — Progress Notes (Signed)
PHARMACY CONSULT NOTE FOR:  OUTPATIENT  PARENTERAL ANTIBIOTIC THERAPY (OPAT)  Indication: Osteomyelitis  Regimen: Daptomycin 700 mg every 24 hours + Metronidazole 500 mg BID  End date: 04/13/21 for dapto 03/21/21 for metronidazole   IV antibiotic discharge orders are pended. To discharging provider:  please sign these orders via discharge navigator,  Select New Orders & click on the button choice - Manage This Unsigned Work.     Thank you for allowing pharmacy to be a part of this patient's care.  Jimmy Footman, PharmD, BCPS, BCIDP Infectious Diseases Clinical Pharmacist Phone: [phone] 03/07/2021, 11:04 AM

## 2021-03-08 DIAGNOSIS — Z4801 Encounter for change or removal of surgical wound dressing: Secondary | ICD-10-CM | POA: Diagnosis not present

## 2021-03-08 DIAGNOSIS — Z89422 Acquired absence of other left toe(s): Secondary | ICD-10-CM | POA: Diagnosis not present

## 2021-03-08 DIAGNOSIS — E785 Hyperlipidemia, unspecified: Secondary | ICD-10-CM | POA: Diagnosis not present

## 2021-03-08 DIAGNOSIS — I4891 Unspecified atrial fibrillation: Secondary | ICD-10-CM | POA: Diagnosis not present

## 2021-03-08 DIAGNOSIS — E1122 Type 2 diabetes mellitus with diabetic chronic kidney disease: Secondary | ICD-10-CM | POA: Diagnosis not present

## 2021-03-08 DIAGNOSIS — E039 Hypothyroidism, unspecified: Secondary | ICD-10-CM | POA: Diagnosis not present

## 2021-03-08 DIAGNOSIS — I5030 Unspecified diastolic (congestive) heart failure: Secondary | ICD-10-CM | POA: Diagnosis not present

## 2021-03-08 DIAGNOSIS — I5022 Chronic systolic (congestive) heart failure: Secondary | ICD-10-CM | POA: Diagnosis not present

## 2021-03-08 DIAGNOSIS — M86171 Other acute osteomyelitis, right ankle and foot: Secondary | ICD-10-CM | POA: Diagnosis not present

## 2021-03-08 DIAGNOSIS — I2510 Atherosclerotic heart disease of native coronary artery without angina pectoris: Secondary | ICD-10-CM | POA: Diagnosis not present

## 2021-03-08 DIAGNOSIS — A4902 Methicillin resistant Staphylococcus aureus infection, unspecified site: Secondary | ICD-10-CM | POA: Diagnosis not present

## 2021-03-08 DIAGNOSIS — I130 Hypertensive heart and chronic kidney disease with heart failure and stage 1 through stage 4 chronic kidney disease, or unspecified chronic kidney disease: Secondary | ICD-10-CM | POA: Diagnosis not present

## 2021-03-08 DIAGNOSIS — I252 Old myocardial infarction: Secondary | ICD-10-CM | POA: Diagnosis not present

## 2021-03-08 DIAGNOSIS — I428 Other cardiomyopathies: Secondary | ICD-10-CM | POA: Diagnosis not present

## 2021-03-08 DIAGNOSIS — I38 Endocarditis, valve unspecified: Secondary | ICD-10-CM | POA: Diagnosis not present

## 2021-03-08 DIAGNOSIS — L03116 Cellulitis of left lower limb: Secondary | ICD-10-CM | POA: Diagnosis not present

## 2021-03-08 DIAGNOSIS — N184 Chronic kidney disease, stage 4 (severe): Secondary | ICD-10-CM | POA: Diagnosis not present

## 2021-03-08 DIAGNOSIS — D631 Anemia in chronic kidney disease: Secondary | ICD-10-CM | POA: Diagnosis not present

## 2021-03-08 DIAGNOSIS — E1169 Type 2 diabetes mellitus with other specified complication: Secondary | ICD-10-CM | POA: Diagnosis not present

## 2021-03-08 DIAGNOSIS — I051 Rheumatic mitral insufficiency: Secondary | ICD-10-CM | POA: Diagnosis not present

## 2021-03-08 DIAGNOSIS — E860 Dehydration: Secondary | ICD-10-CM | POA: Diagnosis not present

## 2021-03-08 DIAGNOSIS — A419 Sepsis, unspecified organism: Secondary | ICD-10-CM | POA: Diagnosis not present

## 2021-03-08 DIAGNOSIS — I5023 Acute on chronic systolic (congestive) heart failure: Secondary | ICD-10-CM | POA: Diagnosis not present

## 2021-03-08 DIAGNOSIS — I447 Left bundle-branch block, unspecified: Secondary | ICD-10-CM | POA: Diagnosis not present

## 2021-03-08 DIAGNOSIS — M869 Osteomyelitis, unspecified: Secondary | ICD-10-CM | POA: Diagnosis not present

## 2021-03-08 DIAGNOSIS — I70202 Unspecified atherosclerosis of native arteries of extremities, left leg: Secondary | ICD-10-CM | POA: Diagnosis not present

## 2021-03-08 DIAGNOSIS — G4733 Obstructive sleep apnea (adult) (pediatric): Secondary | ICD-10-CM | POA: Diagnosis not present

## 2021-03-08 DIAGNOSIS — F32A Depression, unspecified: Secondary | ICD-10-CM | POA: Diagnosis not present

## 2021-03-08 DIAGNOSIS — I255 Ischemic cardiomyopathy: Secondary | ICD-10-CM | POA: Diagnosis not present

## 2021-03-08 DIAGNOSIS — E1151 Type 2 diabetes mellitus with diabetic peripheral angiopathy without gangrene: Secondary | ICD-10-CM | POA: Diagnosis not present

## 2021-03-08 DIAGNOSIS — Z4781 Encounter for orthopedic aftercare following surgical amputation: Secondary | ICD-10-CM | POA: Diagnosis not present

## 2021-03-08 DIAGNOSIS — J189 Pneumonia, unspecified organism: Secondary | ICD-10-CM | POA: Diagnosis not present

## 2021-03-08 DIAGNOSIS — E1142 Type 2 diabetes mellitus with diabetic polyneuropathy: Secondary | ICD-10-CM | POA: Diagnosis not present

## 2021-03-09 ENCOUNTER — Telehealth: Payer: Self-pay | Admitting: *Deleted

## 2021-03-09 ENCOUNTER — Telehealth: Payer: Self-pay | Admitting: Podiatry

## 2021-03-09 NOTE — Telephone Encounter (Signed)
Returned call to [facility] w/ [facility], gave verbal orders per Dr Jacqualyn Posey, verbalized understanding. She saw patient 1 day ago and site was well saturated ,would be better that dressings be changed every other day. Please advise

## 2021-03-09 NOTE — Telephone Encounter (Signed)
Yes, change every other day if they can. Thank you.

## 2021-03-09 NOTE — Telephone Encounter (Signed)
She can change the bandage twice a week. Clean wound with saline. Apply xeroform to the incision and cover with 4x4, kerlix, ACE. Make sure to pad all prominent areas. [name], can you please send this over  [name]- can you make sure she has a follow up appointment for next week? She was discharged from the hospital this week and had a partial first ray amputation. Thanks.

## 2021-03-09 NOTE — Telephone Encounter (Signed)
[name] called with [facility], she was calling to get some wound care orders for [name]. Please call

## 2021-03-12 ENCOUNTER — Telehealth: Payer: Self-pay

## 2021-03-12 DIAGNOSIS — M869 Osteomyelitis, unspecified: Secondary | ICD-10-CM | POA: Diagnosis not present

## 2021-03-12 NOTE — Telephone Encounter (Signed)
She needs in person eval asap.  Please advise UC if she can't be seen at clinic o/w in the near future.

## 2021-03-12 NOTE — Telephone Encounter (Signed)
Saw her today. Her surgical wound has increased redness and beginning to do like it did prior to hospitalization. Incision line is looking macerated. Whole lower left leg is declining. No increased pain or drainage. Cleaned and placed dry dressing as her xeroform was not delivered. Please advise what to do [phone]

## 2021-03-12 NOTE — Telephone Encounter (Signed)
Patient advised to go to [facility] this afternoon if possible. Patient states she will be seeing the foot doctor tomorrow and Dr. Damita Dunnings is okay with that. Advised patient to update [place] as needed.

## 2021-03-13 ENCOUNTER — Ambulatory Visit (INDEPENDENT_AMBULATORY_CARE_PROVIDER_SITE_OTHER): Payer: HMO | Admitting: Podiatry

## 2021-03-13 ENCOUNTER — Ambulatory Visit (INDEPENDENT_AMBULATORY_CARE_PROVIDER_SITE_OTHER): Payer: HMO

## 2021-03-13 ENCOUNTER — Other Ambulatory Visit: Payer: Self-pay

## 2021-03-13 ENCOUNTER — Encounter: Payer: Self-pay | Admitting: Podiatry

## 2021-03-13 DIAGNOSIS — S98112A Complete traumatic amputation of left great toe, initial encounter: Secondary | ICD-10-CM | POA: Diagnosis not present

## 2021-03-13 DIAGNOSIS — M8600 Acute hematogenous osteomyelitis, unspecified site: Secondary | ICD-10-CM

## 2021-03-13 DIAGNOSIS — E1149 Type 2 diabetes mellitus with other diabetic neurological complication: Secondary | ICD-10-CM

## 2021-03-13 DIAGNOSIS — Z9889 Other specified postprocedural states: Secondary | ICD-10-CM

## 2021-03-14 DIAGNOSIS — T8189XA Other complications of procedures, not elsewhere classified, initial encounter: Secondary | ICD-10-CM | POA: Diagnosis not present

## 2021-03-16 DIAGNOSIS — I38 Endocarditis, valve unspecified: Secondary | ICD-10-CM | POA: Diagnosis not present

## 2021-03-16 DIAGNOSIS — I5022 Chronic systolic (congestive) heart failure: Secondary | ICD-10-CM | POA: Diagnosis not present

## 2021-03-16 DIAGNOSIS — M86171 Other acute osteomyelitis, right ankle and foot: Secondary | ICD-10-CM | POA: Diagnosis not present

## 2021-03-16 DIAGNOSIS — A419 Sepsis, unspecified organism: Secondary | ICD-10-CM | POA: Diagnosis not present

## 2021-03-16 DIAGNOSIS — G4733 Obstructive sleep apnea (adult) (pediatric): Secondary | ICD-10-CM | POA: Diagnosis not present

## 2021-03-16 DIAGNOSIS — J189 Pneumonia, unspecified organism: Secondary | ICD-10-CM | POA: Diagnosis not present

## 2021-03-16 DIAGNOSIS — L03116 Cellulitis of left lower limb: Secondary | ICD-10-CM | POA: Diagnosis not present

## 2021-03-16 DIAGNOSIS — A4902 Methicillin resistant Staphylococcus aureus infection, unspecified site: Secondary | ICD-10-CM | POA: Diagnosis not present

## 2021-03-16 DIAGNOSIS — E860 Dehydration: Secondary | ICD-10-CM | POA: Diagnosis not present

## 2021-03-18 NOTE — Progress Notes (Signed)
Subjective: [name] is a 65 y.o. 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  She does not have any pain although she does have neuropathy.  She still on IV As per infectious disease.  Denies any systemic complaints such as fevers, chills, nausea, vomiting. No calf pain, chest pain, shortness of breath.   Objective: General: No acute distress, AAOx3  DP/PT pulses palpable, CRT < 3 sec to all remaining digits.  Left foot: Incision is well coapted without any evidence of dehiscence of the partial first ray amputation site.  There is mild erythema but no significant warmth or drainage or pus or ascending cellulitis.  Incision from the previous metatarsal resection on the fifth is healed but sutures are still intact.  Mild scab to this area.  No erythema or warmth.   There is a hematoma on the left leg is doing much better and is almost resolved. No other areas of tenderness to bilateral lower extremities.  No other open lesions or pre-ulcerative lesions.  No pain with calf compression, swelling, warmth, erythema.   Assessment and Plan:  Status post left partial first amputation, doing well with no complications   -Treatment options discussed including all alternatives, risks, and complications -I removed a couple sutures today as I do they are causing some suture reaction for the partial first amputation.  I removed the sutures that remained on the fifth metatarsal resection site. -Recommended twice a week dressing changes.  Orders given for home health care. -Ice/elevation -Offloading -Continue antibiotics through PICC line per infectious disease. -Monitor for any clinical signs or symptoms of infection and DVT/PE and directed to call the office immediately should any occur or go to the ER. -Follow-up as scheduled or sooner if any problems arise. In the meantime, encouraged to call the office with any questions, concerns, change in  symptoms.   Celesta Gentile, DPM

## 2021-03-19 ENCOUNTER — Other Ambulatory Visit: Payer: Self-pay | Admitting: Family Medicine

## 2021-03-19 DIAGNOSIS — M869 Osteomyelitis, unspecified: Secondary | ICD-10-CM | POA: Diagnosis not present

## 2021-03-21 ENCOUNTER — Ambulatory Visit (INDEPENDENT_AMBULATORY_CARE_PROVIDER_SITE_OTHER): Payer: HMO

## 2021-03-21 ENCOUNTER — Other Ambulatory Visit: Payer: Self-pay

## 2021-03-21 DIAGNOSIS — Z0000 Encounter for general adult medical examination without abnormal findings: Secondary | ICD-10-CM | POA: Diagnosis not present

## 2021-03-21 NOTE — Progress Notes (Signed)
Subjective:   [name] is a 65 y.o. female who presents for Medicare Annual (Subsequent) preventive examination.  Review of Systems: N/A     I connected with the patient today by telephone and verified that I am speaking with the correct person using two identifiers. Location patient: home Location nurse: work Persons participating in the telephone visit: patient, nurse.   I discussed the limitations, risks, security and privacy concerns of performing an evaluation and management service by telephone and the availability of in person appointments. I also discussed with the patient that there may be a patient responsible charge related to this service. The patient expressed understanding and verbally consented to this telephonic visit.        Cardiac Risk Factors include: advanced age (>61mn, >>10women);diabetes mellitus     Objective:    Today's Vitals   03/21/21 0848  PainSc: 0-No pain   There is no height or weight on file to calculate BMI.  Advanced Directives 03/21/2021 03/07/2021 02/03/2021 01/29/2021 01/13/2021 11/25/2020 11/24/2020  Does Patient Have a Medical Advance Directive? Yes Yes Yes Yes Yes Yes Yes  Type of AParamedicof AHobuckenLiving will Living will Living will Living will Out of facility DNR (pink MOST or yellow form) Living will [place] Does patient want to make changes to medical advance directive? - No - Patient declined No - Patient declined No - Patient declined No - Patient declined No - Patient declined No - Patient declined  Copy of [place] Chart? Yes - validated most recent copy scanned in chart (See row information) - - - - - -  Would patient like information on creating a medical advance directive? - - - - - - -  Pre-existing out of facility DNR order (yellow form or pink MOST form) - - - - (No Data) - -    Current Medications (verified) Outpatient Encounter Medications as of  03/21/2021  Medication Sig   amiodarone (PACERONE) 200 MG tablet Take 1 tablet (200 mg total) by mouth daily.   aspirin EC 81 MG EC tablet Take 1 tablet (81 mg total) by mouth daily. Swallow whole.   [START ON 04/16/2021] atorvastatin (LIPITOR) 10 MG tablet Take 1 tablet (10 mg total) by mouth daily.   calcitRIOL (ROCALTROL) 0.5 MCG capsule Take 0.5 mcg by mouth every morning.   Calcium Carb-Cholecalciferol (CALCIUM CARBONATE-VITAMIN D3) 600-400 MG-UNIT TABS Take 1 tablet by mouth 2 (two) times a day.   carvedilol (COREG) 3.125 MG tablet Take 3.125 mg by mouth every morning.   clopidogrel (PLAVIX) 75 MG tablet TAKE 1 TABLET BY MOUTH ONCE DAILY (Patient taking differently: Take 75 mg by mouth daily.)   daptomycin (CUBICIN) IVPB Inject 700 mg into the vein daily. Indication:  Osteomyelitis  First Dose: Yes Last Day of Therapy:  04/13/21 Labs - Once weekly:  CBC/D, BMP, and CPK Labs - Every other week:  ESR and CRP Method of administration: IV Push Method of administration may be changed at the discretion of home infusion pharmacist based upon assessment of the patient and/or caregiver's ability to self-administer the medication ordered.   DULoxetine (CYMBALTA) 30 MG capsule Take 1 capsule (30 mg total) by mouth at bedtime.   ferrous sulfate 325 (65 FE) MG tablet Take 325 mg by mouth every morning.   gabapentin (NEURONTIN) 600 MG tablet Take 1 tablet (600 mg total) by mouth at bedtime.   insulin aspart (NOVOLOG FLEXPEN) 100 UNIT/ML  FlexPen Inject 5-10 Units into the skin 3 (three) times daily before meals. Sugar 100-149 - 5 units.  150-199- 6 units.  200-249- 7 units.  250-299- 8 units. 300-349- 9 units.  350+ 10 units.   Insulin Pen Needle (PEN NEEDLES) 32G X 6 MM MISC 1 each by Does not apply route 4 (four) times daily.   isosorbide mononitrate (IMDUR) 30 MG 24 hr tablet Take 1 tablet (30 mg total) by mouth daily. Needs appt   LANTUS SOLOSTAR 100 UNIT/ML Solostar Pen INJECT 22 UNITS UNDER THE SKIN  DAILY. (Patient taking differently: Inject 22 Units into the skin daily.)   levothyroxine (SYNTHROID) 50 MCG tablet TAKE 1 TABLET BY MOUTH ONCE DAILY BEFOREBREAKFAST (Patient taking differently: Take 50 mcg by mouth daily before breakfast.)   metroNIDAZOLE (FLAGYL) 500 MG tablet Take 1 tablet (500 mg total) by mouth every 12 (twelve) hours.   Multiple Vitamin (MULTIVITAMIN WITH MINERALS) TABS tablet Take 1 tablet by mouth every morning.   Omega-3 Fatty Acids (FISH OIL) 1000 MG CAPS Take 1,000 mg by mouth 2 (two) times a day.   ONETOUCH ULTRA test strip UUD TO CHECK BLOOD SUGAR 3 TIMES DAILY   traMADol (ULTRAM) 50 MG tablet Take 1-2 tablets (50-100 mg total) by mouth at bedtime as needed. (Patient taking differently: Take 50-100 mg by mouth at bedtime as needed for moderate pain.)   nitroGLYCERIN (NITROSTAT) 0.4 MG SL tablet Place 1 tablet (0.4 mg total) under the tongue every 5 (five) minutes as needed for chest pain.   No facility-administered encounter medications on file as of 03/21/2021.    Allergies (verified) Heparin and Nsaids   History: Past Medical History:  Diagnosis Date   Acute myocardial infarction, subendocardial infarction, initial episode of care ([place]) 07/21/2012   Acute osteomyelitis involving ankle and foot ([place]) 10/14/5629   Acute systolic heart failure ([place]) 07/21/2012   New onset 07/19/12; admission to [facility] ED. Elevated Troponins.  S/p 2D-echo with EF 20-25%.  S/p cardiac catheterization with stenting LAD.  Repeat 2D-echo 10/2011 with improved EF of 35%.    Anemia    Automatic implantable cardioverter-defibrillator in situ    a. MDT CRT-D 06/2014, SN: [id number]  .removed in feb 2020   CAD (coronary artery disease)    a. cardiac cath 101/04/2012: PCI/DES to chronically occluded mLAD, consideration PCI to diag branch in 4 weeks.    Cataract    Chicken pox    Chronic kidney disease    Chronic systolic CHF (congestive heart failure) (HCC)    a. mixed ICM & NICM; b. EF  20-25% by echo 07/2012, mid-dist 2/3 of LV sev HK/AK, mild MR. echo 10/2012: EF 30-35%, sev HK ant-septal & inf walls, GR1DD, mild MR, PASP 33. c. echo 02/2013: EF 30%, GR1DD, mild MR. echo 04/2014: EF 30%, Septal-lat dyssynchrony, global HK, inf AK, GR1DD, mild MR. d. echo 10/2014: EF50-55%, WM nl, GR1DD, septal mild paradox. e. echo 02/2015: EF 50-55%, wm    Depression    Heart attack ([place])    Heel ulcer ([place]) 04/27/2015   History of blood transfusion ~ 2011   "plasma; had neuropathy; couldn't walk"   Hypertension    LBBB (left bundle branch block)    Neuromuscular disorder ([place])    Neuropathy 2011   Obesity, unspecified    OSA on CPAP    Moderate with AHI 23/hr and now on CPAP at 16cm H2O.  Her DME is [facility]   Pure hypercholesterolemia    Sepsis ([place]) 09/2018  Type II diabetes mellitus ([place])    Unspecified vitamin D deficiency    Past Surgical History:  Procedure Laterality Date   ABDOMINAL AORTOGRAM W/LOWER EXTREMITY N/A 02/02/2021   Procedure: ABDOMINAL AORTOGRAM W/LOWER EXTREMITY;  Surgeon: Angelia Mould, MD;  Location: [place] CV LAB;  Service: Cardiovascular;  Laterality: N/A;   AMPUTATION Left 03/02/2021   Procedure: AMPUTATION RAY, left great toe;  Surgeon: Trula Slade, DPM;  Location: [place];  Service: Podiatry;  Laterality: Left;   AMPUTATION TOE Right 06/18/2015   Procedure: AMPUTATION TOE;  Surgeon: Samara Deist, DPM;  Location: [facility] ORS;  Service: Podiatry;  Laterality: Right;   AMPUTATION TOE Left 10/08/2018   Procedure: AMPUTATION TOE LEFT 2ND;  Surgeon: Albertine Patricia, DPM;  Location: [facility] OR[facility];  Service: Podiatry;  Laterality: Left;   BACK SURGERY     BI-VENTRICULAR IMPLANTABLE CARDIOVERTER DEFIBRILLATOR N/A 07/06/2014   Procedure: BI-VENTRICULAR IMPLANTABLE CARDIOVERTER DEFIBRILLATOR  (CRT-D);  Surgeon: Deboraha Sprang, MD;  Location: [facility] CATH LAB;  Service: Cardiovascular;  Laterality: N/A;   BI-VENTRICULAR IMPLANTABLE CARDIOVERTER DEFIBRILLATOR  (CRT-D)   07/06/2014   BILATERAL OOPHORECTOMY  01/2011   ovarian cyst benign   BIV ICD INSERTION CRT-D N/A 03/31/2019   Procedure: BIV ICD INSERTION CRT-D;  Surgeon: Constance Haw, MD;  Location: [place] CV LAB;  Service: Cardiovascular;  Laterality: N/A;   COLONOSCOPY WITH PROPOFOL Left 02/22/2015   Procedure: COLONOSCOPY WITH PROPOFOL;  Surgeon: Hulen Luster, MD;  Location: [facility] ENDOSCOPY;  Service: Endoscopy;  Laterality: Left;   CORONARY ANGIOPLASTY WITH STENT PLACEMENT Left 07/2012   new onset systolic CHF; elevated troponins.  Cardiac catheterization with stenting to LAD; EF 15%.  2D-echo: EF 20-25%.   ESOPHAGOGASTRODUODENOSCOPY N/A 02/22/2015   Procedure: ESOPHAGOGASTRODUODENOSCOPY (EGD);  Surgeon: Hulen Luster, MD;  Location: [facility] ENDOSCOPY;  Service: Endoscopy;  Laterality: N/A;   I & D EXTREMITY Left 11/28/2020   Procedure: IRRIGATION AND DEBRIDEMENT OF FOOT;  Surgeon: Trula Slade, DPM;  Location: [place];  Service: Podiatry;  Laterality: Left;   I & D EXTREMITY Left 01/31/2021   Procedure: IRRIGATION AND DEBRIDEMENT EXTREMITY OF LEFT FOOT WITH BONE BIOPSY OF  1ST METATARSAL AND FIFTH METATARSAL;  Surgeon: Felipa Furnace, DPM;  Location: [place];  Service: Podiatry;  Laterality: Left;   I & D EXTREMITY Left 02/03/2021   Procedure: IRRIGATION AND DEBRIDEMENT EXTREMITY WITH INTERNAL AMPUTATION FIRST AND FIFTH RAY;  Surgeon: Felipa Furnace, DPM;  Location: [place];  Service: Podiatry;  Laterality: Left;   INCISION AND DRAINAGE ABSCESS Right 2007   groin; with ICU stay due to sepsis.   IR FLUORO GUIDE CV LINE RIGHT  03/06/2021   IR US GUIDE VASC ACCESS RIGHT  03/06/2021   LAPAROSCOPIC CHOLECYSTECTOMY  2011   LEFT HEART CATH AND CORONARY ANGIOGRAPHY N/A 10/05/2018   Procedure: LEFT HEART CATH AND CORONARY ANGIOGRAPHY;  Surgeon: Minna Merritts, MD;  Location: [place] CV LAB;  Service: Cardiovascular;  Laterality: N/A;   LEFT HEART CATHETERIZATION WITH CORONARY ANGIOGRAM N/A 07/21/2012    Procedure: LEFT HEART CATHETERIZATION WITH CORONARY ANGIOGRAM;  Surgeon: Jolaine Artist, MD;  Location: [facility] CATH LAB;  Service: Cardiovascular;  Laterality: N/A;   LUMBAR [place]   L4-5   PACEMAKER REMOVAL  11/2018   due to infection around pacemaker   [place] (PCI-S) N/A 07/23/2012   Procedure: PERCUTANEOUS CORONARY STENT INTERVENTION (PCI-S);  Surgeon: Sherren Mocha, MD;  Location: [facility] CATH LAB;  Service: Cardiovascular;  Laterality: N/A;   PERIPHERAL VASCULAR BALLOON ANGIOPLASTY Left 10/06/2018   Procedure: PERIPHERAL VASCULAR BALLOON ANGIOPLASTY;  Surgeon: Katha Cabal, MD;  Location: [place] CV LAB;  Service: Cardiovascular;  Laterality: Left;   PERIPHERAL VASCULAR CATHETERIZATION N/A 02/10/2015   Procedure: Picc Line Insertion;  Surgeon: Katha Cabal, MD;  Location: [place] CV LAB;  Service: Cardiovascular;  Laterality: N/A;   RIGHT HEART CATH N/A 03/29/2019   Procedure: RIGHT HEART CATH;  Surgeon: Jolaine Artist, MD;  Location: [place] CV LAB;  Service: Cardiovascular;  Laterality: N/A;   TEE WITHOUT CARDIOVERSION N/A 10/02/2018   Procedure: TRANSESOPHAGEAL ECHOCARDIOGRAM (TEE);  Surgeon: Wellington Hampshire, MD;  Location: [facility] ORS;  Service: Cardiovascular;  Laterality: N/A;   [place]  01/2011   Fibroids/DUB.  Ovaries removed. [place].   Family History  Problem Relation Age of Onset   Diabetes Mother    Hypertension Mother    Arthritis Mother        knees, lumbar DDD, cervical DDD   Cancer Father        prostate,skin,lymphoma.   Cancer Brother 33       bladder cancer; non-smoker   Diabetes Maternal Grandmother    Heart disease Maternal Grandmother    COPD Maternal Grandmother    Diabetes Paternal Grandmother    Obesity Brother    Diabetes Son    Hypertension Son    Cancer Maternal Grandfather    Diabetes Paternal Grandfather    Social History   Socioeconomic  History   Marital status: Widowed    Spouse name: [name]   Number of children: 2   Years of education: 12   Highest education level: High school graduate  Occupational History   Occupation: disabled    Employer: UNEMPLOYED    Comment: 03/2010 for peripheral neuropathy   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  Tobacco Use   Smoking status: Never   Smokeless tobacco: Never  Vaping Use   Vaping Use: Never used  Substance and Sexual Activity   Alcohol use: No    Alcohol/week: 0.0 standard drinks   Drug use: No   Sexual activity: Not Currently    Birth control/protection: Post-menopausal, Surgical  Other Topics Concern   Not on file  Social History Narrative   Widowed, husband died when she was in surgery.        Children:  2 children (daughter, son). Two grandsons and 2 step grandchildren.      Lives: with husband, daughter, son-in-law, 2 grandsons, and 1 on the way.      Employment: disability for peripheral neuropathy 2012.  Previously had home daycare.   Social Determinants of Health   Financial Resource Strain: Low Risk    Difficulty of Paying Living Expenses: Not hard at all  Food Insecurity: No Food Insecurity   Worried About Charity fundraiser in the Last Year: Never true   [place] in the Last Year: Never true  Transportation Needs: No Transportation Needs   Lack of Transportation (Medical): No   Lack of Transportation (Non-Medical): No  Physical Activity: Inactive   Days of Exercise per Week: 0 days   Minutes of Exercise per Session: 0 min  Stress: No Stress Concern Present   Feeling of Stress : Not at all  Social Connections: Not on file    Tobacco Counseling Counseling given: Not Answered   Clinical Intake:  Pre-visit preparation completed: Yes  Pain : No/denies pain Pain Score: 0-No pain     Nutritional Risks: None Diabetes: Yes CBG done?: No Did pt. bring in CBG monitor from home?: No  How often do you need to have someone  help you when you read instructions, pamphlets, or other written materials from your doctor or pharmacy?: 1 - Never  Diabetic: Yes Nutrition Risk Assessment:  Has the patient had any N/V/D within the last 2 months?  No  Does the patient have any non-healing wounds?  No  Has the patient had any unintentional weight loss or weight gain?  No   Diabetes:  Is the patient diabetic?  Yes  If diabetic, was a CBG obtained today?  No virtual/telephone visit  Did the patient bring in their glucometer from home?  No  virtual/telephone visit  How often do you monitor your CBG's? 3 times a day.   Financial Strains and Diabetes Management:  Are you having any financial strains with the device, your supplies or your medication? No .  Does the patient want to be seen by Chronic Care Management for management of their diabetes?  No  Would the patient like to be referred to a Nutritionist or for Diabetic Management?  No   Diabetic Exams:  Diabetic Eye Exam: Overdue for diabetic eye exam. Pt has been advised about the importance in completing this exam. Patient advised to call and schedule an eye exam. Diabetic Foot Exam: Overdue, Pt has been advised about the importance in completing this exam. Interpreter Needed?: No  Information entered by :: CJohnson, LPN   Activities of Daily Living In your present state of health, do you have any difficulty performing the following activities: 03/21/2021 03/07/2021  Hearing? N N  Vision? N N  Difficulty concentrating or making decisions? N N  Walking or climbing stairs? N Y  Dressing or bathing? N N  Doing errands, shopping? N Y  Conservation officer, nature and eating ? N -  Using the Toilet? N -  In the past six months, have you accidently leaked urine? N -  Do you have problems with loss of bowel control? N -  Managing your Medications? N -  Managing your Finances? N -  Housekeeping or managing your Housekeeping? N -  Some recent data might be hidden    Patient  Care Team: [name], MD as PCP - General (Family Medicine) Minna Merritts, MD as PCP - Cardiology (Cardiology) Deboraha Sprang, MD as PCP - Electrophysiology (Cardiology) Bensimhon, Shaune Pascal, MD as PCP - Advanced Heart Failure (Cardiology) Calvert Cantor, MD as Consulting Physician (Ophthalmology) Lavonia Dana, MD as Consulting Physician (Internal Medicine) Elvina Mattes, Adele Schilder as Attending Physician (Podiatry) Tsosie Billing, MD as Consulting Physician (Infectious Diseases) Alda Berthold, DO as Consulting Physician (Neurology)  Indicate any recent Medical Services you may have received from other than [facility] providers in the past year (date may be approximate).     Assessment:   This is a routine wellness examination for [place].  Hearing/Vision screen No results found.  Dietary issues and exercise activities discussed: Current Exercise Habits: The patient does not participate in regular exercise at present, Exercise limited by: None identified   Goals Addressed             This Visit's Progress    Patient Stated       03/21/2021, I will maintain and continue medications as prescribed.         Depression Screen  PHQ 2/9 Scores 03/21/2021 09/28/2020 12/06/2019 09/06/2019 04/16/2019 03/22/2019 02/03/2019  PHQ - 2 Score 0 1 0 0 0 0 0  PHQ- 9 Score 0 7 - - - - -    Fall Risk Fall Risk  03/21/2021 09/28/2020 09/28/2020 09/15/2020 12/06/2019  Falls in the past year? 0 0 0 0 1  Number falls in past yr: 0 0 0 0 0  Injury with Fall? 0 0 0 0 0  Comment - - - - -  Risk for fall due to : Medication side effect - - - -  Risk for fall due to: Comment - - - - -  Follow up Falls evaluation completed;Falls prevention discussed Falls evaluation completed - - -    FALL RISK PREVENTION PERTAINING TO THE HOME:  Any stairs in or around the home? Yes  If so, are there any without handrails? No  Home free of loose throw rugs in walkways, pet beds, electrical cords, etc? Yes   Adequate lighting in your home to reduce risk of falls? Yes   ASSISTIVE DEVICES UTILIZED TO PREVENT FALLS:  Life alert? No  Use of a cane, walker or w/c? Yes  Grab bars in the bathroom? Yes  Shower chair or bench in shower? Yes  Elevated toilet seat or a handicapped toilet? No   TIMED UP AND GO:  Was the test performed?  N/A virtual/telephone visit .   Cognitive Function: MMSE - Mini Mental State Exam 03/21/2021  Not completed: Refused     Mini Cog  Mini-Cog screen was not completed. Patient refused. Maximum score is 22. A value of 0 denotes this part of the MMSE was not completed or the patient failed this part of the Mini-Cog screening.  6CIT Screen 09/08/2017  What Year? 0 points  What month? 0 points  What time? 0 points  Count back from 20 0 points  Months in reverse 0 points  Repeat phrase 2 points  Total Score 2    Immunizations Immunization History  Administered Date(s) Administered   Hepatitis B 03/16/2013   Hepatitis B, adult 03/30/2014, 07/18/2014   Influenza Split 07/21/2012, 07/03/2020   Influenza, High Dose Seasonal PF 07/01/2019   Influenza,inj,Quad PF,6+ Mos 06/16/2013, 07/07/2014, 06/18/2015, 08/06/2016, 07/20/2018   Influenza-Unspecified 07/23/2017, 07/22/2018, 06/11/2019   PFIZER(Purple Top)SARS-COV-2 Vaccination 11/21/2019, 12/21/2019, 07/19/2020   Pneumococcal Polysaccharide-23 08/08/2011, 06/18/2015, 08/13/2016   Tdap 03/07/2010, 05/03/2016   Zoster, Live 04/06/2014    TDAP status: Up to date  Flu Vaccine status: Up to date  Pneumococcal vaccine status: Due, Education has been provided regarding the importance of this vaccine. Advised may receive this vaccine at local pharmacy or Health Dept. Aware to provide a copy of the vaccination record if obtained from local pharmacy or Health Dept. Verbalized acceptance and understanding.  Covid-19 vaccine status: Completed 3 vaccines, second booster due. Patient aware  Qualifies for Shingles  Vaccine? Yes   Zostavax completed Yes   Shingrix Completed?: No.    Education has been provided regarding the importance of this vaccine. Patient has been advised to call insurance company to determine out of pocket expense if they have not yet received this vaccine. Advised may also receive vaccine at local pharmacy or Health Dept. Verbalized acceptance and understanding.  Screening Tests Health Maintenance  Topic Date Due   Zoster Vaccines- Shingrix (1 of 2) Never done   DEXA SCAN  Never done   PNA vac Low Risk Adult (1 of 2 - PCV13) 02/12/2019   OPHTHALMOLOGY EXAM  04/09/2019   FOOT EXAM  07/21/2019   URINE MICROALBUMIN  02/03/2020   COVID-19 Vaccine (4 - Booster for Pfizer series) 10/19/2020   INFLUENZA VACCINE  05/07/2021   HEMOGLOBIN A1C  07/30/2021   MAMMOGRAM  11/08/2022   COLONOSCOPY (Pts 45-60yr Insurance coverage will need to be confirmed)  02/21/2025   TETANUS/TDAP  05/03/2026   Hepatitis C Screening  Completed   HPV VACCINES  Aged Out    Health Maintenance  Health Maintenance Due  Topic Date Due   Zoster Vaccines- Shingrix (1 of 2) Never done   DEXA SCAN  Never done   PNA vac Low Risk Adult (1 of 2 - PCV13) 02/12/2019   OPHTHALMOLOGY EXAM  04/09/2019   FOOT EXAM  07/21/2019   URINE MICROALBUMIN  02/03/2020   COVID-19 Vaccine (4 - Booster for PH[employer] View Heightsseries) 10/19/2020    Colorectal cancer screening: Type of screening: Colonoscopy. Completed 02/22/2015. Repeat every 10 years  Mammogram status: Completed 11/08/2020. Repeat every year  Bone Density status: due, will complete when mammogram comes due  Lung Cancer Screening: (Low Dose CT Chest recommended if Age 67-80years, 30 pack-year currently smoking OR have quit w/in 15years.) does not qualify.  Additional Screening:  Hepatitis C Screening: does qualify; Completed 08/04/2015  Vision Screening: Recommended annual ophthalmology exams for early detection of glaucoma and other disorders of the eye. Is the  patient up to date with their annual eye exam?  Yes  Who is the provider or what is the name of the office in which the patient attends annual eye exams? Dr. DBing PlumeIf pt is not established with a provider, would they like to be referred to a provider to establish care? No .   Dental Screening: Recommended annual dental exams for proper oral hygiene  Community Resource Referral / Chronic Care Management: CRR required this visit?  No   CCM required this visit?  No      Plan:     I have personally reviewed and noted the following in the patient's chart:   Medical and social history Use of alcohol, tobacco or illicit drugs  Current medications and supplements including opioid prescriptions.  Functional ability and status Nutritional status Physical activity Advanced directives List of other physicians Hospitalizations, surgeries, and ER visits in previous 12 months Vitals Screenings to include cognitive, depression, and falls Referrals and appointments  In addition, I have reviewed and discussed with patient certain preventive protocols, quality metrics, and best practice recommendations. A written personalized care plan for preventive services as well as general preventive health recommendations were provided to patient.   Due to this being a telephonic visit, the after visit summary with patients personalized plan was offered to patient via office or my-chart. Patient preferred to pick up at office at next visit or via mychart.   JAndrez Grime LPN   [id number]

## 2021-03-21 NOTE — Progress Notes (Signed)
PCP notes:  Health Maintenance: Prevnar- due Shingrix- due Dexa- due  Eye exam- due Foot exam- due    Abnormal Screenings: none   Patient concerns: none   Nurse concerns: none   Next PCP appt.: none

## 2021-03-21 NOTE — Patient Instructions (Signed)
[name] , Thank you for taking time to come for your Medicare Wellness Visit. I appreciate your ongoing commitment to your health goals. Please review the following plan we discussed and let me know if I can assist you in the future.   Screening recommendations/referrals: Colonoscopy: Up to date, completed 02/22/2015, due 02/2025 Mammogram: Up to date, completed 11/08/2020, due 11/2021 Bone Density: due, will complete when mammogram comes due  Recommended yearly ophthalmology/optometry visit for glaucoma screening and checkup Recommended yearly dental visit for hygiene and checkup  Vaccinations: Influenza vaccine: Up to date, completed 07/03/2020, due 05/2021 Pneumococcal vaccine: due, will discuss with provider at next visit  Tdap vaccine: Up to date, completed 05/03/2016, due 04/2026 Shingles vaccine: due, check with your insurance regarding coverage if interested    Covid-19:completed 3 vaccines, second booster due. Complete at your convenience  Advanced directives: copy in chart  Conditions/risks identified: diabetes  Next appointment: Follow up in one year for your annual wellness visit    Preventive Care 67 Years and Older, Female Preventive care refers to lifestyle choices and visits with your health care provider that can promote health and wellness. What does preventive care include? A yearly physical exam. This is also called an annual well check. Dental exams once or twice a year. Routine eye exams. Ask your health care provider how often you should have your eyes checked. Personal lifestyle choices, including: Daily care of your teeth and gums. Regular physical activity. Eating a healthy diet. Avoiding tobacco and drug use. Limiting alcohol use. Practicing safe sex. Taking low-dose aspirin every day. Taking vitamin and mineral supplements as recommended by your health care provider. What happens during an annual well check? The services and screenings done by your  health care provider during your annual well check will depend on your age, overall health, lifestyle risk factors, and family history of disease. Counseling  Your health care provider may ask you questions about your: Alcohol use. Tobacco use. Drug use. Emotional well-being. Home and relationship well-being. Sexual activity. Eating habits. History of falls. Memory and ability to understand (cognition). Work and work Statistician. Reproductive health. Screening  You may have the following tests or measurements: Height, weight, and BMI. Blood pressure. Lipid and cholesterol levels. These may be checked every 5 years, or more frequently if you are over 41 years old. Skin check. Lung cancer screening. You may have this screening every year starting at age 63 if you have a 30-pack-year history of smoking and currently smoke or have quit within the past 15 years. Fecal occult blood test (FOBT) of the stool. You may have this test every year starting at age 67. Flexible sigmoidoscopy or colonoscopy. You may have a sigmoidoscopy every 5 years or a colonoscopy every 10 years starting at age 67. Hepatitis C blood test. Hepatitis B blood test. Sexually transmitted disease (STD) testing. Diabetes screening. This is done by checking your blood sugar (glucose) after you have not eaten for a while (fasting). You may have this done every 1-3 years. Bone density scan. This is done to screen for osteoporosis. You may have this done starting at age 67. Mammogram. This may be done every 1-2 years. Talk to your health care provider about how often you should have regular mammograms. Talk with your health care provider about your test results, treatment options, and if necessary, the need for more tests. Vaccines  Your health care provider may recommend certain vaccines, such as: Influenza vaccine. This is recommended every year. Tetanus, diphtheria,  and acellular pertussis (Tdap, Td) vaccine. You may  need a Td booster every 10 years. Zoster vaccine. You may need this after age 67. Pneumococcal 13-valent conjugate (PCV13) vaccine. One dose is recommended after age 67. Pneumococcal polysaccharide (PPSV23) vaccine. One dose is recommended after age 52. Talk to your health care provider about which screenings and vaccines you need and how often you need them. This information is not intended to replace advice given to you by your health care provider. Make sure you discuss any questions you have with your health care provider. Document Released: 10/20/2015 Document Revised: 06/12/2016 Document Reviewed: 07/25/2015 Elsevier Interactive Patient Education  2017 [place] Prevention in the Home Falls can cause injuries. They can happen to people of all ages. There are many things you can do to make your home safe and to help prevent falls. What can I do on the outside of my home? Regularly fix the edges of walkways and driveways and fix any cracks. Remove anything that might make you trip as you walk through a door, such as a raised step or threshold. Trim any bushes or trees on the path to your home. Use bright outdoor lighting. Clear any walking paths of anything that might make someone trip, such as rocks or tools. Regularly check to see if handrails are loose or broken. Make sure that both sides of any steps have handrails. Any raised decks and porches should have guardrails on the edges. Have any leaves, snow, or ice cleared regularly. Use sand or salt on walking paths during winter. Clean up any spills in your garage right away. This includes oil or grease spills. What can I do in the bathroom? Use night lights. Install grab bars by the toilet and in the tub and shower. Do not use towel bars as grab bars. Use non-skid mats or decals in the tub or shower. If you need to sit down in the shower, use a plastic, non-slip stool. Keep the floor dry. Clean up any water that spills on  the floor as soon as it happens. Remove soap buildup in the tub or shower regularly. Attach bath mats securely with double-sided non-slip rug tape. Do not have throw rugs and other things on the floor that can make you trip. What can I do in the bedroom? Use night lights. Make sure that you have a light by your bed that is easy to reach. Do not use any sheets or blankets that are too big for your bed. They should not hang down onto the floor. Have a firm chair that has side arms. You can use this for support while you get dressed. Do not have throw rugs and other things on the floor that can make you trip. What can I do in the kitchen? Clean up any spills right away. Avoid walking on wet floors. Keep items that you use a lot in easy-to-reach places. If you need to reach something above you, use a strong step stool that has a grab bar. Keep electrical cords out of the way. Do not use floor polish or wax that makes floors slippery. If you must use wax, use non-skid floor wax. Do not have throw rugs and other things on the floor that can make you trip. What can I do with my stairs? Do not leave any items on the stairs. Make sure that there are handrails on both sides of the stairs and use them. Fix handrails that are broken or loose. Make sure  that handrails are as long as the stairways. Check any carpeting to make sure that it is firmly attached to the stairs. Fix any carpet that is loose or worn. Avoid having throw rugs at the top or bottom of the stairs. If you do have throw rugs, attach them to the floor with carpet tape. Make sure that you have a light switch at the top of the stairs and the bottom of the stairs. If you do not have them, ask someone to add them for you. What else can I do to help prevent falls? Wear shoes that: Do not have high heels. Have rubber bottoms. Are comfortable and fit you well. Are closed at the toe. Do not wear sandals. If you use a stepladder: Make sure  that it is fully opened. Do not climb a closed stepladder. Make sure that both sides of the stepladder are locked into place. Ask someone to hold it for you, if possible. Clearly mark and make sure that you can see: Any grab bars or handrails. First and last steps. Where the edge of each step is. Use tools that help you move around (mobility aids) if they are needed. These include: Canes. Walkers. Scooters. Crutches. Turn on the lights when you go into a dark area. Replace any light bulbs as soon as they burn out. Set up your furniture so you have a clear path. Avoid moving your furniture around. If any of your floors are uneven, fix them. If there are any pets around you, be aware of where they are. Review your medicines with your doctor. Some medicines can make you feel dizzy. This can increase your chance of falling. Ask your doctor what other things that you can do to help prevent falls. This information is not intended to replace advice given to you by your health care provider. Make sure you discuss any questions you have with your health care provider. Document Released: 07/20/2009 Document Revised: 02/29/2016 Document Reviewed: 10/28/2014 Elsevier Interactive Patient Education  2017 [employer].

## 2021-03-22 ENCOUNTER — Ambulatory Visit (INDEPENDENT_AMBULATORY_CARE_PROVIDER_SITE_OTHER): Payer: HMO | Admitting: Podiatry

## 2021-03-22 ENCOUNTER — Other Ambulatory Visit: Payer: Self-pay

## 2021-03-22 DIAGNOSIS — E1149 Type 2 diabetes mellitus with other diabetic neurological complication: Secondary | ICD-10-CM

## 2021-03-22 DIAGNOSIS — Z9889 Other specified postprocedural states: Secondary | ICD-10-CM

## 2021-03-22 DIAGNOSIS — S98112A Complete traumatic amputation of left great toe, initial encounter: Secondary | ICD-10-CM

## 2021-03-23 DIAGNOSIS — A4902 Methicillin resistant Staphylococcus aureus infection, unspecified site: Secondary | ICD-10-CM | POA: Diagnosis not present

## 2021-03-23 DIAGNOSIS — G4733 Obstructive sleep apnea (adult) (pediatric): Secondary | ICD-10-CM | POA: Diagnosis not present

## 2021-03-23 DIAGNOSIS — E860 Dehydration: Secondary | ICD-10-CM | POA: Diagnosis not present

## 2021-03-23 DIAGNOSIS — I5022 Chronic systolic (congestive) heart failure: Secondary | ICD-10-CM | POA: Diagnosis not present

## 2021-03-23 DIAGNOSIS — M86171 Other acute osteomyelitis, right ankle and foot: Secondary | ICD-10-CM | POA: Diagnosis not present

## 2021-03-23 DIAGNOSIS — A419 Sepsis, unspecified organism: Secondary | ICD-10-CM | POA: Diagnosis not present

## 2021-03-23 DIAGNOSIS — J189 Pneumonia, unspecified organism: Secondary | ICD-10-CM | POA: Diagnosis not present

## 2021-03-26 ENCOUNTER — Telehealth: Payer: Self-pay

## 2021-03-26 ENCOUNTER — Encounter: Payer: Self-pay | Admitting: Internal Medicine

## 2021-03-26 DIAGNOSIS — E1122 Type 2 diabetes mellitus with diabetic chronic kidney disease: Secondary | ICD-10-CM | POA: Diagnosis not present

## 2021-03-26 DIAGNOSIS — D631 Anemia in chronic kidney disease: Secondary | ICD-10-CM | POA: Diagnosis not present

## 2021-03-26 DIAGNOSIS — Z4801 Encounter for change or removal of surgical wound dressing: Secondary | ICD-10-CM | POA: Diagnosis not present

## 2021-03-26 DIAGNOSIS — Z89422 Acquired absence of other left toe(s): Secondary | ICD-10-CM | POA: Diagnosis not present

## 2021-03-26 DIAGNOSIS — I130 Hypertensive heart and chronic kidney disease with heart failure and stage 1 through stage 4 chronic kidney disease, or unspecified chronic kidney disease: Secondary | ICD-10-CM | POA: Diagnosis not present

## 2021-03-26 DIAGNOSIS — I2510 Atherosclerotic heart disease of native coronary artery without angina pectoris: Secondary | ICD-10-CM | POA: Diagnosis not present

## 2021-03-26 DIAGNOSIS — I051 Rheumatic mitral insufficiency: Secondary | ICD-10-CM | POA: Diagnosis not present

## 2021-03-26 DIAGNOSIS — N184 Chronic kidney disease, stage 4 (severe): Secondary | ICD-10-CM | POA: Diagnosis not present

## 2021-03-26 DIAGNOSIS — I4891 Unspecified atrial fibrillation: Secondary | ICD-10-CM | POA: Diagnosis not present

## 2021-03-26 DIAGNOSIS — E1142 Type 2 diabetes mellitus with diabetic polyneuropathy: Secondary | ICD-10-CM | POA: Diagnosis not present

## 2021-03-26 DIAGNOSIS — F32A Depression, unspecified: Secondary | ICD-10-CM | POA: Diagnosis not present

## 2021-03-26 DIAGNOSIS — M869 Osteomyelitis, unspecified: Secondary | ICD-10-CM | POA: Diagnosis not present

## 2021-03-26 DIAGNOSIS — Z4781 Encounter for orthopedic aftercare following surgical amputation: Secondary | ICD-10-CM | POA: Diagnosis not present

## 2021-03-26 DIAGNOSIS — I252 Old myocardial infarction: Secondary | ICD-10-CM | POA: Diagnosis not present

## 2021-03-26 DIAGNOSIS — E785 Hyperlipidemia, unspecified: Secondary | ICD-10-CM | POA: Diagnosis not present

## 2021-03-26 DIAGNOSIS — I70202 Unspecified atherosclerosis of native arteries of extremities, left leg: Secondary | ICD-10-CM | POA: Diagnosis not present

## 2021-03-26 DIAGNOSIS — I255 Ischemic cardiomyopathy: Secondary | ICD-10-CM | POA: Diagnosis not present

## 2021-03-26 DIAGNOSIS — I428 Other cardiomyopathies: Secondary | ICD-10-CM | POA: Diagnosis not present

## 2021-03-26 DIAGNOSIS — I447 Left bundle-branch block, unspecified: Secondary | ICD-10-CM | POA: Diagnosis not present

## 2021-03-26 DIAGNOSIS — E039 Hypothyroidism, unspecified: Secondary | ICD-10-CM | POA: Diagnosis not present

## 2021-03-26 DIAGNOSIS — I5023 Acute on chronic systolic (congestive) heart failure: Secondary | ICD-10-CM | POA: Diagnosis not present

## 2021-03-26 DIAGNOSIS — E1151 Type 2 diabetes mellitus with diabetic peripheral angiopathy without gangrene: Secondary | ICD-10-CM | POA: Diagnosis not present

## 2021-03-26 DIAGNOSIS — E1169 Type 2 diabetes mellitus with other specified complication: Secondary | ICD-10-CM | POA: Diagnosis not present

## 2021-03-26 DIAGNOSIS — I5030 Unspecified diastolic (congestive) heart failure: Secondary | ICD-10-CM | POA: Diagnosis not present

## 2021-03-26 DIAGNOSIS — G4733 Obstructive sleep apnea (adult) (pediatric): Secondary | ICD-10-CM | POA: Diagnosis not present

## 2021-03-26 NOTE — Telephone Encounter (Signed)
[name] called asking for verbal orders to decrease frequency to 1 x week for [facility] Line (then phone cut out) until she left her phone number [phone]. I have called her to ask that she call back to fill in the blanks.

## 2021-03-26 NOTE — Progress Notes (Signed)
Subjective: [name] is a 65 y.o. 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  No pain that she reports no changes.  Home health can change the dressing as well as her mom.  She still on IV As per infectious disease.  Denies any systemic complaints such as fevers, chills, nausea, vomiting. No calf pain, chest pain, shortness of breath.   Objective: General: No acute distress, AAOx3  DP/PT pulses palpable, CRT < 3 sec to all remaining digits.  Left foot: Incision is well coapted with sutures intact however on the central aspect there is a opening of the skin.  There appears to be some Gelfoam that we use during surgery that has come through which I removed.  After cleaning this there is no probing to bone only bloody drainage expressed there is no purulence.  There is no surrounding erythema, ascending cellulitis.  The erythema that was present previously has improved. No other areas of tenderness to bilateral lower extremities.  No other open lesions or pre-ulcerative lesions.  No pain with calf compression, swelling, warmth, erythema.   Assessment and Plan:  Status post left partial first amputation, central area of dehiscence  -Treatment options discussed including all alternatives, risks, and complications -I did clean the wound today removed and Gelfoam that was present.  I irrigated the wound as well.  We will packed the wound gently with a small amount saline moistened 4 x 4.  I supplied a single Steri-Strip to help hold the wound together.  Discussed possible debridement and closure of the wound as long as clinically there is no signs of infection.  Continue IV antibiotics for now.  Limit weightbearing, elevation. -Monitor for any clinical signs or symptoms of infection and directed to call the office immediately should any occur or go to the ER.  Return in about 1 week (around 03/29/2021).  Trula Slade DPM

## 2021-03-27 ENCOUNTER — Encounter: Payer: Self-pay | Admitting: Internal Medicine

## 2021-03-27 ENCOUNTER — Ambulatory Visit (INDEPENDENT_AMBULATORY_CARE_PROVIDER_SITE_OTHER): Payer: HMO | Admitting: Internal Medicine

## 2021-03-27 ENCOUNTER — Other Ambulatory Visit: Payer: Self-pay

## 2021-03-27 VITALS — BP 110/60 | HR 62 | Ht 68.0 in

## 2021-03-27 DIAGNOSIS — Z95810 Presence of automatic (implantable) cardiac defibrillator: Secondary | ICD-10-CM

## 2021-03-27 DIAGNOSIS — I480 Paroxysmal atrial fibrillation: Secondary | ICD-10-CM

## 2021-03-27 DIAGNOSIS — I5022 Chronic systolic (congestive) heart failure: Secondary | ICD-10-CM

## 2021-03-27 DIAGNOSIS — I493 Ventricular premature depolarization: Secondary | ICD-10-CM | POA: Diagnosis not present

## 2021-03-27 DIAGNOSIS — Z79899 Other long term (current) drug therapy: Secondary | ICD-10-CM | POA: Diagnosis not present

## 2021-03-27 DIAGNOSIS — I428 Other cardiomyopathies: Secondary | ICD-10-CM

## 2021-03-27 NOTE — Progress Notes (Signed)
Patient ID: [name], female   DOB: 1956-05-29, 65 y.o.   MRN: [id number]      Patient Care Team: [name], MD as PCP - General (Family Medicine) Minna Merritts, MD as PCP - Cardiology (Cardiology) Deboraha Sprang, MD as PCP - Electrophysiology (Cardiology) Bensimhon, Shaune Pascal, MD as PCP - Advanced Heart Failure (Cardiology) Calvert Cantor, MD as Consulting Physician (Ophthalmology) Lavonia Dana, MD as Consulting Physician (Internal Medicine) Elvina Mattes, Adele Schilder as Attending Physician (Podiatry) Tsosie Billing, MD as Consulting Physician (Infectious Diseases) Alda Berthold, DO as Consulting Physician (Neurology)   HPI  [name] is a 65 y.o. female CRT-D implanted for nonischemic cardiomyopathy. Developed recurrent MSSA bacteremia and underwent system extraction 3/20 with aspiration of the thrombus of the lead.  Recurrent heart failure and interval worsening of LV function prompted decision to reimplant done [facility]) 6/20  Following reimplantation LV function has improved considerably (see below)  History of atrial tachycardia and PVCs  for which she has been treated with amiodarone. You have [name]   Today, the patient denies chest pain, shortness of breath, nocturnal dyspnea, orthopnea or peripheral edema.  There have been no palpitations, lightheadedness or syncope.  Not very mobile.  She has had to have amputation of her left great toe.  Blood cultures drawn 2/22 and 4/22 were negative  She notes she can only transfer her self to restroom and to and from bed or a chair. She is unable to walk she has to stand and pivot to her desired locations   Patient denies symptoms of GI intolerance, sun sensitivity, neurological symptoms attributable to amiodarone.   DATE TEST EF%    9/15  echo  30 %    1 /17 echo  50 %    12/19 Echo  45-50%    12/19 TEE   No vegetations  12/19 LHC   pLAD80 with ISR; mLAD70 pCx 80;D1 90 mRCA 40  2/20 CT      2/20 TEE    3/20  (2) Echo  40%   6/20 Echo  10-15%   12/20 Echo 35%   6/21 Echo  50-55%     Date Cr K Hgb TSH LFTs   LDL  3/20 3.54>>1.18  11 4.64  18   7/20 2.15 4.9 9.7 (6/20)17.4    18   9/20  2.02 4.3  21.4  55  11/20 2.17 4.7 11.2 6.17 23   1/21 2.44 4.9      12/21 2.09 5.1 11.6     6/22  (3/22)2.48 1.58 (3/22)4.7 4.2 (3/22)13.0 10.8               Past Medical History:  Diagnosis Date   Acute myocardial infarction, subendocardial infarction, initial episode of care ([place]) 07/21/2012   Acute osteomyelitis involving ankle and foot ([place]) 05/07/4480   Acute systolic heart failure ([place]) 07/21/2012   New onset 07/19/12; admission to [facility] ED. Elevated Troponins.  S/p 2D-echo with EF 20-25%.  S/p cardiac catheterization with stenting LAD.  Repeat 2D-echo 10/2011 with improved EF of 35%.    Anemia    Automatic implantable cardioverter-defibrillator in situ    a. MDT CRT-D 06/2014, SN: [id number]  .removed in feb 2020   CAD (coronary artery disease)    a. cardiac cath 101/04/2012: PCI/DES to chronically occluded mLAD, consideration PCI to diag branch in 4 weeks.    Cataract    Chicken pox    Chronic kidney disease  Chronic systolic CHF (congestive heart failure) (HCC)    a. mixed ICM & NICM; b. EF 20-25% by echo 07/2012, mid-dist 2/3 of LV sev HK/AK, mild MR. echo 10/2012: EF 30-35%, sev HK ant-septal & inf walls, GR1DD, mild MR, PASP 33. c. echo 02/2013: EF 30%, GR1DD, mild MR. echo 04/2014: EF 30%, Septal-lat dyssynchrony, global HK, inf AK, GR1DD, mild MR. d. echo 10/2014: EF50-55%, WM nl, GR1DD, septal mild paradox. e. echo 02/2015: EF 50-55%, wm    Depression    Heart attack ([place])    Heel ulcer ([place]) 04/27/2015   History of blood transfusion ~ 2011   "plasma; had neuropathy; couldn't walk"   Hypertension    LBBB (left bundle branch block)    Neuromuscular disorder ([place])    Neuropathy 2011   Obesity, unspecified    OSA on CPAP    Moderate with AHI 23/hr and now on CPAP  at 16cm H2O.  Her DME is [facility]   Pure hypercholesterolemia    Sepsis ([place]) 09/2018   Type II diabetes mellitus ([place])    Unspecified vitamin D deficiency    Past Surgical History:  Procedure Laterality Date   ABDOMINAL AORTOGRAM W/LOWER EXTREMITY N/A 02/02/2021   Procedure: ABDOMINAL AORTOGRAM W/LOWER EXTREMITY;  Surgeon: Angelia Mould, MD;  Location: [place] CV [facility];  Service: Cardiovascular;  Laterality: N/A;   AMPUTATION Left 03/02/2021   Procedure: AMPUTATION RAY, left great toe;  Surgeon: Trula Slade, DPM;  Location: [place];  Service: Podiatry;  Laterality: Left;   AMPUTATION TOE Right 06/18/2015   Procedure: AMPUTATION TOE;  Surgeon: Samara Deist, DPM;  Location: [facility];  Service: Podiatry;  Laterality: Right;   AMPUTATION TOE Left 10/08/2018   Procedure: AMPUTATION TOE LEFT 2ND;  Surgeon: Albertine Patricia, DPM;  Location: [facility];  Service: Podiatry;  Laterality: Left;   BACK SURGERY     BI-VENTRICULAR IMPLANTABLE CARDIOVERTER DEFIBRILLATOR N/A 07/06/2014   Procedure: BI-VENTRICULAR IMPLANTABLE CARDIOVERTER DEFIBRILLATOR  (CRT-D);  Surgeon: Deboraha Sprang, MD;  Location: [facility] CATH LAB;  Service: Cardiovascular;  Laterality: N/A;   BI-VENTRICULAR IMPLANTABLE CARDIOVERTER DEFIBRILLATOR  (CRT-D)  07/06/2014   BILATERAL OOPHORECTOMY  01/2011   ovarian cyst benign   BIV ICD INSERTION CRT-D N/A 03/31/2019   Procedure: BIV ICD INSERTION CRT-D;  Surgeon: Constance Haw, MD;  Location: [place] [facility];  Service: Cardiovascular;  Laterality: N/A;   COLONOSCOPY WITH PROPOFOL Left 02/22/2015   Procedure: COLONOSCOPY WITH PROPOFOL;  Surgeon: Hulen Luster, MD;  Location: [facility] ENDOSCOPY;  Service: Endoscopy;  Laterality: Left;   CORONARY ANGIOPLASTY WITH STENT PLACEMENT Left 07/2012   new onset systolic CHF; elevated troponins.  Cardiac catheterization with stenting to LAD; EF 15%.  2D-echo: EF 20-25%.   ESOPHAGOGASTRODUODENOSCOPY N/A 02/22/2015   Procedure:  ESOPHAGOGASTRODUODENOSCOPY (EGD);  Surgeon: Hulen Luster, MD;  Location: [facility] ENDOSCOPY;  Service: Endoscopy;  Laterality: N/A;   I & D EXTREMITY Left 11/28/2020   Procedure: IRRIGATION AND DEBRIDEMENT OF FOOT;  Surgeon: Trula Slade, DPM;  Location: [place];  Service: Podiatry;  Laterality: Left;   I & D EXTREMITY Left 01/31/2021   Procedure: IRRIGATION AND DEBRIDEMENT EXTREMITY OF LEFT FOOT WITH BONE BIOPSY OF  1ST METATARSAL AND FIFTH METATARSAL;  Surgeon: Felipa Furnace, DPM;  Location: [place];  Service: Podiatry;  Laterality: Left;   I & D EXTREMITY Left 02/03/2021   Procedure: IRRIGATION AND DEBRIDEMENT EXTREMITY WITH INTERNAL AMPUTATION FIRST AND FIFTH RAY;  Surgeon: Felipa Furnace, DPM;  Location: [place];  Service: Podiatry;  Laterality: Left;   INCISION AND DRAINAGE ABSCESS Right 2007   groin; with ICU stay due to sepsis.   IR FLUORO GUIDE CV LINE RIGHT  03/06/2021   IR US GUIDE VASC ACCESS RIGHT  03/06/2021   LAPAROSCOPIC CHOLECYSTECTOMY  2011   LEFT HEART CATH AND CORONARY ANGIOGRAPHY N/A 10/05/2018   Procedure: LEFT HEART CATH AND CORONARY ANGIOGRAPHY;  Surgeon: Minna Merritts, MD;  Location: [place] [facility];  Service: Cardiovascular;  Laterality: N/A;   LEFT HEART CATHETERIZATION WITH CORONARY ANGIOGRAM N/A 07/21/2012   Procedure: LEFT HEART CATHETERIZATION WITH CORONARY ANGIOGRAM;  Surgeon: Jolaine Artist, MD;  Location: [facility] CATH LAB;  Service: Cardiovascular;  Laterality: N/A;   LUMBAR [place]   L4-5   PACEMAKER REMOVAL  11/2018   due to infection around pacemaker   [place] (PCI-S) N/A 07/23/2012   Procedure: PERCUTANEOUS CORONARY STENT INTERVENTION (PCI-S);  Surgeon: Sherren Mocha, MD;  Location: [facility] CATH LAB;  Service: Cardiovascular;  Laterality: N/A;   PERIPHERAL VASCULAR BALLOON ANGIOPLASTY Left 10/06/2018   Procedure: PERIPHERAL VASCULAR BALLOON ANGIOPLASTY;  Surgeon: Katha Cabal, MD;  Location: [place] [facility] LAB;   Service: Cardiovascular;  Laterality: Left;   PERIPHERAL VASCULAR CATHETERIZATION N/A 02/10/2015   Procedure: Picc Line Insertion;  Surgeon: Katha Cabal, MD;  Location: [place] [facility];  Service: Cardiovascular;  Laterality: N/A;   RIGHT HEART CATH N/A 03/29/2019   Procedure: RIGHT HEART CATH;  Surgeon: Jolaine Artist, MD;  Location: [place] CV LAB;  Service: Cardiovascular;  Laterality: N/A;   TEE WITHOUT CARDIOVERSION N/A 10/02/2018   Procedure: TRANSESOPHAGEAL ECHOCARDIOGRAM (TEE);  Surgeon: Wellington Hampshire, MD;  Location: [facility] OR[facility];  Service: Cardiovascular;  Laterality: N/A;   [place]  01/2011   Fibroids/DUB.  Ovaries removed. [place].   Current Meds  Medication Sig   amiodarone (PACERONE) 200 MG tablet Take 1 tablet (200 mg total) by mouth daily.   aspirin EC 81 MG EC tablet Take 1 tablet (81 mg total) by mouth daily. Swallow whole.   [START ON 04/16/2021] atorvastatin (LIPITOR) 10 MG tablet Take 1 tablet (10 mg total) by mouth daily.   calcitRIOL (ROCALTROL) 0.5 MCG capsule Take 0.5 mcg by mouth every morning.   Calcium Carb-Cholecalciferol (CALCIUM CARBONATE-VITAMIN D3) 600-400 MG-UNIT TABS Take 1 tablet by mouth 2 (two) times a day.   carvedilol (COREG) 3.125 MG tablet Take 3.125 mg by mouth every morning.   clopidogrel (PLAVIX) 75 MG tablet TAKE 1 TABLET BY MOUTH ONCE DAILY   daptomycin (CUBICIN) IVPB Inject 700 mg into the vein daily. Indication:  Osteomyelitis  First Dose: Yes Last Day of Therapy:  04/13/21 Labs - Once weekly:  CBC/D, BMP, and CPK Labs - Every other week:  ESR and CRP Method of administration: IV Push Method of administration may be changed at the discretion of home infusion pharmacist based upon assessment of the patient and/or caregiver's ability to self-administer the medication ordered.   DULoxetine (CYMBALTA) 30 MG capsule Take 1 capsule (30 mg total) by mouth at bedtime.   ferrous sulfate 325 (65 FE) MG  tablet Take 325 mg by mouth every morning.   gabapentin (NEURONTIN) 600 MG tablet Take 1 tablet (600 mg total) by mouth at bedtime.   insulin aspart (NOVOLOG FLEXPEN) 100 UNIT/ML FlexPen Inject 5-10 Units into the skin 3 (three) times daily before meals. Sugar 100-149 - 5 units.  150-199- 6 units.  200-249- 7 units.  250-299- 8 units. 300-349- 9 units.  350+ 10 units.   Insulin Pen Needle (PEN NEEDLES) 32G X 6 MM MISC 1 each by Does not apply route 4 (four) times daily.   isosorbide mononitrate (IMDUR) 30 MG 24 hr tablet Take 1 tablet (30 mg total) by mouth daily. Needs appt   LANTUS SOLOSTAR 100 UNIT/ML Solostar Pen INJECT 22 UNITS UNDER THE SKIN DAILY.   levothyroxine (SYNTHROID) 50 MCG tablet TAKE 1 TABLET BY MOUTH ONCE DAILY BEFOREBREAKFAST   metroNIDAZOLE (FLAGYL) 500 MG tablet Take 1 tablet (500 mg total) by mouth every 12 (twelve) hours.   Multiple Vitamin (MULTIVITAMIN WITH MINERALS) TABS tablet Take 1 tablet by mouth every morning.   Omega-3 Fatty Acids (FISH OIL) 1000 MG CAPS Take 1,000 mg by mouth 2 (two) times a day.   ONETOUCH ULTRA test strip UUD TO CHECK BLOOD SUGAR 3 TIMES DAILY   traMADol (ULTRAM) 50 MG tablet Take 1-2 tablets (50-100 mg total) by mouth at bedtime as needed.   Allergies  Allergen Reactions   Heparin    Nsaids     Kidney disease   Review of Systems negative except from HPI and PMH  Physical Exam: BP 110/60 (BP Location: Right Arm, Patient Position: Sitting, Cuff Size: Large)   Pulse 62   Ht 5' 8"  (1.727 m)   SpO2 98%   BMI 43.58 kg/m  Well developed and well nourished in no acute distress HENT normal Neck supple with JVP-flat Lungs Clear Device pocket well healed; without hematoma or erythema.  There is no tethering  Regular rate and rhythm, no  gallop No  murmur Abd-soft with active BS No Clubbing cyanosis  edema Skin-warm and dry A & Oriented  Grossly normal sensory and motor function  ECG: Sinus with P synchronous pacing with a negative QRS  lead V1 and an upright QRS lead I  Review of the tracing from 11/21 showed a QR in lead I and RS in lead V1  Assessment and  Plan  CRT-D recent reimplantation following extraction--loss of LV capture  Nonischemic cardiomyopathy  Coronary artery disease  Congestive heart failure- chronic-systolic class II  Renal insufficiency grade 3   Hypothyroidism  High Risk Medication Surveillance-Amiodarone   PVCs  Recent amputation  Volume status is relatively stable.  Renal function is improved.  We will continue her on as needed diuretics She takes amiodarone for PVCs and nonsustained ventricular tachycardia.  Hard to quantitate the former of her device.  We will decrease her amiodarone from 200-100 mg a day.  We will check her TSH on her surveillance.  On arrival, she was not biventricular paced.  Changing the vector from 2--3-coil and shortening the AV delay from 120--90 resulted in an upright QRS in lead V1 and negative QRS in lead I consistent with electrocardiographic evidence of resynchronization.  Renal function remains problematic and so we will hold on her diuretic regime as noted above for right now.  Cardiomyopathy is being managed currently by carvedilol which we will continue at 3.125 and isosorbide 30.  And losartan 50    No ischemic symptoms   continue imdur and clopidogrel     I,Stephanie Williams,acting as a scribe for Virl Axe, MD.,have documented all relevant documentation on the behalf of Virl Axe, MD,as directed by  Virl Axe, MD while in the presence of Virl Axe, MD.  I, Virl Axe, MD, have reviewed all documentation for this visit. The documentation on 03/27/21  for the exam, diagnosis, procedures, and orders are all accurate and complete.

## 2021-03-27 NOTE — Patient Instructions (Signed)
Medication Instructions:  - Your physician recommends that you continue on your current medications as directed. Please refer to the Current Medication list given to you today.  *If you need a refill on your cardiac medications before your next appointment, please call your pharmacy*   Lab Work: - Your physician recommends that you have lab work today: TSH  If you have labs (blood work) drawn today and your tests are completely normal, you will receive your results only by: MyChart Message (if you have MyChart) OR A paper copy in the mail If you have any lab test that is abnormal or we need to change your treatment, we will call you to review the results.   Testing/Procedures: - none ordered   Follow-Up: At [facility], you and your health needs are our priority.  As part of our continuing mission to provide you with exceptional heart care, we have created designated Provider Care Teams.  These Care Teams include your primary Cardiologist (physician) and Advanced Practice Providers (APPs -  Physician Assistants and Nurse Practitioners) who all work together to provide you with the care you need, when you need it.  We recommend signing up for the patient portal called "MyChart".  Sign up information is provided on this After Visit Summary.  MyChart is used to connect with patients for Virtual Visits (Telemedicine).  Patients are able to view lab/test results, encounter notes, upcoming appointments, etc.  Non-urgent messages can be sent to your provider as well.   To learn more about what you can do with MyChart, go to [email or web address].    Your next appointment:   6 month(s)  The format for your next appointment:   In Person  Provider:   Virl Axe, MD   Other Instructions N/a

## 2021-03-28 LAB — TSH: TSH: 1.16 u[IU]/mL (ref 0.450–4.500)

## 2021-03-29 ENCOUNTER — Ambulatory Visit (INDEPENDENT_AMBULATORY_CARE_PROVIDER_SITE_OTHER): Payer: HMO | Admitting: Podiatry

## 2021-03-29 ENCOUNTER — Other Ambulatory Visit: Payer: Self-pay

## 2021-03-29 ENCOUNTER — Ambulatory Visit: Payer: PPO

## 2021-03-29 DIAGNOSIS — S98112A Complete traumatic amputation of left great toe, initial encounter: Secondary | ICD-10-CM

## 2021-03-29 DIAGNOSIS — Z9889 Other specified postprocedural states: Secondary | ICD-10-CM

## 2021-03-29 DIAGNOSIS — E1149 Type 2 diabetes mellitus with other diabetic neurological complication: Secondary | ICD-10-CM

## 2021-03-30 ENCOUNTER — Ambulatory Visit (INDEPENDENT_AMBULATORY_CARE_PROVIDER_SITE_OTHER): Payer: HMO | Admitting: Infectious Diseases

## 2021-03-30 ENCOUNTER — Telehealth: Payer: Self-pay

## 2021-03-30 ENCOUNTER — Other Ambulatory Visit: Payer: Self-pay

## 2021-03-30 ENCOUNTER — Other Ambulatory Visit: Payer: Self-pay | Admitting: Cardiology

## 2021-03-30 VITALS — BP 131/76 | HR 64 | Temp 96.4°F | Resp 16 | Ht 68.0 in | Wt 274.0 lb

## 2021-03-30 DIAGNOSIS — Z5181 Encounter for therapeutic drug level monitoring: Secondary | ICD-10-CM

## 2021-03-30 DIAGNOSIS — M8610 Other acute osteomyelitis, unspecified site: Secondary | ICD-10-CM

## 2021-03-30 NOTE — Telephone Encounter (Signed)
Advised [name] at [facility] that patient will need PULL PICC after last dose on 04/13/2021. Verbal orders understood and [place] notified. Per DR [place] also called to relay to patient no follow up needed since booked. Had to leave secure VM

## 2021-03-30 NOTE — Progress Notes (Signed)
[facility]                                                             [place], [place], [place], [address]                                                                  Phn. [phone]; Fax: [phone]                                                                             Date: 03/30/21  Reason for Referral: HFU for osteomyelitis   Assessment 66 Y O female with PMH of DM, PAD ( s/p angioplasty in the left superficial femoral artery in 4/21) with a left foot wound that seems to have started with a skin thickening  in January 2022 She is s/p several OR interventions by Podiatry - s/p I and D on 2/22 ( cultures positive for MSSA, given a course of cephalexin) s/p a course of clindamycin with no improvement in 4/9 followed by repeat I and D/biopsy of hallux and fifth metatarsal along with sesamoidectomy on 4/27, bone resection of the base of proximal phalanx and 5th metatarsal head and base of the 5th proximal phalanx with delayed primary closure on 4/30 ( discharged on Augmentin, cultures grew Amp S E faecalis,  Staph capitis and MSSA ), s/p left great toe amputation with concerns of remaining Osteomyelitis. OR cultures growing Staph capitis.   Plan Complete 6 weeks of daptomycin. End date 04/13/21 Will request labs from [facility] for medication monitoring  ( 6/13 cr 1.54, ESR 36, CRP 2) ck not available  Fu in 2 weeks  Fu with Podiatry   All questions and concerns were discussed and addressed. Patient verbalized understanding of the plan. ____________________________________________________________________________________________________________________ HPI/Interval events Here for HFU for Left foot Osteomyelitis. She is accompanied by her mother. She is getting IV Daptomycin through the PICC line. Denies any issues with the PICC line. Denies any  nausea/vomiting, abdominal pain and diarrhea. She is following with Podiatry. She is also following up with Dr Jacqualyn Posey from [place]. She denies any erythema/swelling and drainage from the surgical site. No complaints today.   ROS: Negative for fever, chills, activity change, appetite change, fatigue and unexpected weight change Negative for chest pain/cough and SOB Negative for GU symptoms   Past Medical History:  Diagnosis Date   Acute myocardial infarction, subendocardial infarction, initial episode of care ([place]) 07/21/2012   Acute osteomyelitis involving ankle and foot ([place]) 04/14/2955   Acute systolic heart failure ([place]) 07/21/2012   New onset 07/19/12; admission to [facility] ED. Elevated Troponins.  S/p 2D-echo with EF 20-25%.  S/p cardiac catheterization with stenting LAD.  Repeat 2D-echo 10/2011 with improved EF of 35%.  Anemia    Automatic implantable cardioverter-defibrillator in situ    a. MDT CRT-D 06/2014, SN: [id number]  .removed in feb 2020   CAD (coronary artery disease)    a. cardiac cath 101/04/2012: PCI/DES to chronically occluded mLAD, consideration PCI to diag branch in 4 weeks.    Cataract    Chicken pox    Chronic kidney disease    Chronic systolic CHF (congestive heart failure) (HCC)    a. mixed ICM & NICM; b. EF 20-25% by echo 07/2012, mid-dist 2/3 of LV sev HK/AK, mild MR. echo 10/2012: EF 30-35%, sev HK ant-septal & inf walls, GR1DD, mild MR, PASP 33. c. echo 02/2013: EF 30%, GR1DD, mild MR. echo 04/2014: EF 30%, Septal-lat dyssynchrony, global HK, inf AK, GR1DD, mild MR. d. echo 10/2014: EF50-55%, WM nl, GR1DD, septal mild paradox. e. echo 02/2015: EF 50-55%, wm    Depression    Heart attack ([place])    Heel ulcer ([place]) 04/27/2015   History of blood transfusion ~ 2011   "plasma; had neuropathy; couldn't walk"   Hypertension    LBBB (left bundle branch block)    Neuromuscular disorder ([place])    Neuropathy 2011   Obesity, unspecified    OSA on CPAP    Moderate with AHI  23/hr and now on CPAP at 16cm H2O.  Her DME is [facility]   Pure hypercholesterolemia    Sepsis ([place]) 09/2018   Type II diabetes mellitus ([place])    Unspecified vitamin D deficiency    Past Surgical History:  Procedure Laterality Date   ABDOMINAL AORTOGRAM W/LOWER EXTREMITY N/A 02/02/2021   Procedure: ABDOMINAL AORTOGRAM W/LOWER EXTREMITY;  Surgeon: Angelia Mould, MD;  Location: [place] [facility];  Service: Cardiovascular;  Laterality: N/A;   AMPUTATION Left 03/02/2021   Procedure: AMPUTATION RAY, left great toe;  Surgeon: Trula Slade, DPM;  Location: [place];  Service: Podiatry;  Laterality: Left;   AMPUTATION TOE Right 06/18/2015   Procedure: AMPUTATION TOE;  Surgeon: Samara Deist, DPM;  Location: [facility];  Service: Podiatry;  Laterality: Right;   AMPUTATION TOE Left 10/08/2018   Procedure: AMPUTATION TOE LEFT 2ND;  Surgeon: Albertine Patricia, DPM;  Location: [facility];  Service: Podiatry;  Laterality: Left;   BACK SURGERY     BI-VENTRICULAR IMPLANTABLE CARDIOVERTER DEFIBRILLATOR N/A 07/06/2014   Procedure: BI-VENTRICULAR IMPLANTABLE CARDIOVERTER DEFIBRILLATOR  (CRT-D);  Surgeon: Deboraha Sprang, MD;  Location: [facility] CATH LAB;  Service: Cardiovascular;  Laterality: N/A;   BI-VENTRICULAR IMPLANTABLE CARDIOVERTER DEFIBRILLATOR  (CRT-D)  07/06/2014   BILATERAL OOPHORECTOMY  01/2011   ovarian cyst benign   BIV ICD INSERTION CRT-D N/A 03/31/2019   Procedure: BIV ICD INSERTION CRT-D;  Surgeon: Constance Haw, MD;  Location: [place] [facility];  Service: Cardiovascular;  Laterality: N/A;   COLONOSCOPY WITH PROPOFOL Left 02/22/2015   Procedure: COLONOSCOPY WITH PROPOFOL;  Surgeon: Hulen Luster, MD;  Location: [facility] ENDOSCOPY;  Service: Endoscopy;  Laterality: Left;   CORONARY ANGIOPLASTY WITH STENT PLACEMENT Left 07/2012   new onset systolic CHF; elevated troponins.  Cardiac catheterization with stenting to LAD; EF 15%.  2D-echo: EF 20-25%.   ESOPHAGOGASTRODUODENOSCOPY N/A 02/22/2015   Procedure:  ESOPHAGOGASTRODUODENOSCOPY (EGD);  Surgeon: Hulen Luster, MD;  Location: [facility] ENDOSCOPY;  Service: Endoscopy;  Laterality: N/A;   I & D EXTREMITY Left 11/28/2020   Procedure: IRRIGATION AND DEBRIDEMENT OF FOOT;  Surgeon: Trula Slade, DPM;  Location: [facility];  Service: Podiatry;  Laterality: Left;   I & D EXTREMITY  Left 01/31/2021   Procedure: IRRIGATION AND DEBRIDEMENT EXTREMITY OF LEFT FOOT WITH BONE BIOPSY OF  1ST METATARSAL AND FIFTH METATARSAL;  Surgeon: Felipa Furnace, DPM;  Location: [facility];  Service: Podiatry;  Laterality: Left;   I & D EXTREMITY Left 02/03/2021   Procedure: IRRIGATION AND DEBRIDEMENT EXTREMITY WITH INTERNAL AMPUTATION FIRST AND FIFTH RAY;  Surgeon: Felipa Furnace, DPM;  Location: [place] [facility];  Service: Podiatry;  Laterality: Left;   INCISION AND DRAINAGE ABSCESS Right 2007   groin; with ICU stay due to sepsis.   IR FLUORO GUIDE CV LINE RIGHT  03/06/2021   IR US GUIDE VASC ACCESS RIGHT  03/06/2021   LAPAROSCOPIC CHOLECYSTECTOMY  2011   LEFT HEART CATH AND CORONARY ANGIOGRAPHY N/A 10/05/2018   Procedure: LEFT HEART CATH AND CORONARY ANGIOGRAPHY;  Surgeon: Minna Merritts, MD;  Location: [place] [facility];  Service: Cardiovascular;  Laterality: N/A;   LEFT HEART CATHETERIZATION WITH CORONARY ANGIOGRAM N/A 07/21/2012   Procedure: LEFT HEART CATHETERIZATION WITH CORONARY ANGIOGRAM;  Surgeon: Jolaine Artist, MD;  Location: [facility] CATH LAB;  Service: Cardiovascular;  Laterality: N/A;   LUMBAR [place]   L4-5   PACEMAKER REMOVAL  11/2018   due to infection around pacemaker   [place] (PCI-S) N/A 07/23/2012   Procedure: PERCUTANEOUS CORONARY STENT INTERVENTION (PCI-S);  Surgeon: Sherren Mocha, MD;  Location: [facility] CATH LAB;  Service: Cardiovascular;  Laterality: N/A;   PERIPHERAL VASCULAR BALLOON ANGIOPLASTY Left 10/06/2018   Procedure: PERIPHERAL VASCULAR BALLOON ANGIOPLASTY;  Surgeon: Katha Cabal, MD;  Location: [place] [facility];   Service: Cardiovascular;  Laterality: Left;   PERIPHERAL VASCULAR CATHETERIZATION N/A 02/10/2015   Procedure: Picc Line Insertion;  Surgeon: Katha Cabal, MD;  Location: [place] [facility];  Service: Cardiovascular;  Laterality: N/A;   RIGHT HEART CATH N/A 03/29/2019   Procedure: RIGHT HEART CATH;  Surgeon: Jolaine Artist, MD;  Location: [place] [facility];  Service: Cardiovascular;  Laterality: N/A;   TEE WITHOUT CARDIOVERSION N/A 10/02/2018   Procedure: TRANSESOPHAGEAL ECHOCARDIOGRAM (TEE);  Surgeon: Wellington Hampshire, MD;  Location: [facility];  Service: Cardiovascular;  Laterality: N/A;   [place]  01/2011   Fibroids/DUB.  Ovaries removed. [place].   Current Outpatient Medications on File Prior to Visit  Medication Sig Dispense Refill   amiodarone (PACERONE) 200 MG tablet Take 1 tablet (200 mg total) by mouth daily. 90 tablet 1   aspirin EC 81 MG EC tablet Take 1 tablet (81 mg total) by mouth daily. Swallow whole. 30 tablet 11   [START ON 04/16/2021] atorvastatin (LIPITOR) 10 MG tablet Take 1 tablet (10 mg total) by mouth daily. 30 tablet 11   calcitRIOL (ROCALTROL) 0.5 MCG capsule Take 0.5 mcg by mouth every morning.     Calcium Carb-Cholecalciferol (CALCIUM CARBONATE-VITAMIN D3) 600-400 MG-UNIT TABS Take 1 tablet by mouth 2 (two) times a day.     carvedilol (COREG) 3.125 MG tablet Take 3.125 mg by mouth every morning.     clopidogrel (PLAVIX) 75 MG tablet TAKE 1 TABLET BY MOUTH ONCE DAILY 90 tablet 1   daptomycin (CUBICIN) IVPB Inject 700 mg into the vein daily. Indication:  Osteomyelitis  First Dose: Yes Last Day of Therapy:  04/13/21 Labs - Once weekly:  CBC/D, BMP, and CPK Labs - Every other week:  ESR and CRP Method of administration: IV Push Method of administration may be changed at the discretion of home  infusion pharmacist based upon assessment of the patient and/or caregiver's ability to self-administer the medication ordered. 37 Units  0   DULoxetine (CYMBALTA) 30 MG capsule Take 1 capsule (30 mg total) by mouth at bedtime. 90 capsule 3   ferrous sulfate 325 (65 FE) MG tablet Take 325 mg by mouth every morning.     gabapentin (NEURONTIN) 600 MG tablet Take 1 tablet (600 mg total) by mouth at bedtime.     insulin aspart (NOVOLOG FLEXPEN) 100 UNIT/ML FlexPen Inject 5-10 Units into the skin 3 (three) times daily before meals. Sugar 100-149 - 5 units.  150-199- 6 units.  200-249- 7 units.  250-299- 8 units. 300-349- 9 units.  350+ 10 units. 15 mL    Insulin Pen Needle (PEN NEEDLES) 32G X 6 MM MISC 1 each by Does not apply route 4 (four) times daily. 150 each 11   isosorbide mononitrate (IMDUR) 30 MG 24 hr tablet Take 1 tablet (30 mg total) by mouth daily. Needs appt 90 tablet 3   LANTUS SOLOSTAR 100 UNIT/ML Solostar Pen INJECT 22 UNITS UNDER THE SKIN DAILY. 15 mL 5   levothyroxine (SYNTHROID) 50 MCG tablet TAKE 1 TABLET BY MOUTH ONCE DAILY BEFOREBREAKFAST 90 tablet 1   Multiple Vitamin (MULTIVITAMIN WITH MINERALS) TABS tablet Take 1 tablet by mouth every morning.     Omega-3 Fatty Acids (FISH OIL) 1000 MG CAPS Take 1,000 mg by mouth 2 (two) times a day.     ONETOUCH ULTRA test strip UUD TO CHECK BLOOD SUGAR 3 TIMES DAILY 300 each 2   traMADol (ULTRAM) 50 MG tablet Take 1-2 tablets (50-100 mg total) by mouth at bedtime as needed. 60 tablet 1   nitroGLYCERIN (NITROSTAT) 0.4 MG SL tablet Place 1 tablet (0.4 mg total) under the tongue every 5 (five) minutes as needed for chest pain. 25 tablet 3   No current facility-administered medications on file prior to visit.   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  Tobacco Use   Smoking status: Never   Smokeless tobacco: Never  Vaping Use   Vaping Use: Never used  Substance and Sexual Activity   Alcohol use: No    Alcohol/week: 0.0 standard drinks   Drug use: No   Sexual activity: Not Currently    Birth control/protection: Post-menopausal, Surgical  Other Topics Concern   Not on file  Social History Narrative   Widowed, husband died when she was in surgery.        Children:  2 children (daughter, son). Two grandsons and 2 step grandchildren.      Lives: with husband, daughter, son-in-law, 2 grandsons, and 1 on the way.      Employment: disability for peripheral neuropathy 2012.  Previously had home daycare.   Social Determinants of Health   Financial Resource Strain: Low Risk    Difficulty of Paying Living Expenses: Not hard at all  Food Insecurity: No Food Insecurity   Worried About Charity fundraiser in the Last Year: Never true  Ran Out of Food in the Last Year: Never true  Transportation Needs: No Transportation Needs   Lack of Transportation (Medical): No   Lack of Transportation (Non-Medical): No  Physical Activity: Inactive   Days of Exercise per Week: 0 days   Minutes of Exercise per Session: 0 min  Stress: No Stress Concern Present   Feeling of Stress : Not at all  Social Connections: Not on file  Intimate Partner Violence: Not At Risk   Fear of Current or Ex-Partner: No   Emotionally Abused: No   Physically Abused: No   Sexually Abused: No    Vitals BP 131/76   Pulse 64   Temp (!) 96.4 F (35.8 C)   Resp 16   Ht 5' 8"  (1.727 m)   Wt 274 lb (124.3 kg) Comment: With boot  SpO2 99%   BMI 41.66 kg/m    Examination  General - not in acute distress, comfortably sitting in chair HEENT - PEERLA, no pallor and no icterus Chest - b/l clear air entry, no additional sounds CVS- Normal s1s2, RRR Abdomen - Soft Neuro: grossly normal Psych : calm and  cooperative Left foot      Recent labs CBC Latest Ref Rng & Units 03/07/2021 03/05/2021 03/04/2021  WBC 4.0 - 10.5 K/uL 7.7 6.8 8.4  Hemoglobin 12.0 - 15.0 g/dL 10.8(L) 9.2(L) 9.7(L)  Hematocrit 36.0 - 46.0 % 32.6(L) 28.2(L) 29.7(L)  Platelets 150 - 400 K/uL 180 141(L) 147(L)   CMP Latest Ref Rng & Units 03/07/2021 03/06/2021 03/05/2021  Glucose 70 - 99 mg/dL 123(H) 150(H) 174(H)  BUN 8 - 23 mg/dL 37(H) 42(H) 52(H)  Creatinine 0.44 - 1.00 mg/dL 1.58(H) 1.58(H) 1.85(H)  Sodium 135 - 145 mmol/L 139 136 136  Potassium 3.5 - 5.1 mmol/L 4.2 3.9 4.1  Chloride 98 - 111 mmol/L 108 107 107  CO2 22 - 32 mmol/L 19(L) 21(L) 20(L)  Calcium 8.9 - 10.3 mg/dL 9.2 8.9 8.7(L)  Total Protein 6.5 - 8.1 g/dL - - -  Total Bilirubin 0.3 - 1.2 mg/dL - - -  Alkaline Phos 38 - 126 U/L - - -  AST 15 - 41 U/L - - -  ALT 0 - 44 U/L - - -    Pertinent Microbiology Results for orders placed or performed during the hospital encounter of 02/26/21  Culture, blood (routine x 2)     Status: None   Collection Time: 02/26/21  7:00 PM   Specimen: BLOOD RIGHT ARM  Result Value Ref Range Status   Specimen Description BLOOD RIGHT ARM  Final   Special Requests   Final    BOTTLES DRAWN AEROBIC AND ANAEROBIC Blood Culture adequate volume   Culture   Final    NO GROWTH 5 DAYS Performed at [facility], [address] [place], [place] [address]    Report Status 03/03/2021 FINAL  Final  Culture, blood (routine x 2)     Status: None   Collection Time: 02/26/21  7:15 PM   Specimen: BLOOD LEFT ARM  Result Value Ref Range Status   Specimen Description BLOOD LEFT ARM  Final   Special Requests   Final    BOTTLES DRAWN AEROBIC ONLY Blood Culture results may not be optimal due to an excessive volume of blood received in culture bottles   Culture   Final    NO GROWTH 5 DAYS Performed at [facility], [place] [address] [place], [place] [address]    Report Status 03/03/2021  FINAL  Final  Resp Panel by RT-PCR (Flu  A&B, Covid) Nasopharyngeal Swab     Status: None   Collection Time: 02/27/21  3:22 AM   Specimen: Nasopharyngeal Swab; Nasopharyngeal(NP) swabs in vial transport medium  Result Value Ref Range Status   SARS Coronavirus 2 by RT PCR NEGATIVE NEGATIVE Final    Comment: (NOTE) SARS-CoV-2 target nucleic acids are NOT DETECTED.  The SARS-CoV-2 RNA is generally detectable in upper respiratory specimens during the acute phase of infection. The lowest concentration of SARS-CoV-2 viral copies this assay can detect is 138 copies/mL. A negative result does not preclude SARS-Cov-2 infection and should not be used as the sole basis for treatment or other patient management decisions. A negative result may occur with  improper specimen collection/handling, submission of specimen other than nasopharyngeal swab, presence of viral mutation(s) within the areas targeted by this assay, and inadequate number of viral copies(<138 copies/mL). A negative result must be combined with clinical observations, patient history, and epidemiological information. The expected result is Negative.  Fact Sheet for Patients:  EntrepreneurPulse.com.au  Fact Sheet for Healthcare Providers:  IncredibleEmployment.be  This test is no t yet approved or cleared by the [place] FDA and  has been authorized for detection and/or diagnosis of SARS-CoV-2 by FDA under an Emergency Use Authorization (EUA). This EUA will remain  in effect (meaning this test can be used) for the duration of the COVID-19 declaration under Section 564(b)(1) of the Act, 21 U.S.C.section 360bbb-3(b)(1), unless the authorization is terminated  or revoked sooner.       Influenza A by PCR NEGATIVE NEGATIVE Final   Influenza B by PCR NEGATIVE NEGATIVE Final    Comment: (NOTE) The Xpert Xpress SARS-CoV-2/FLU/RSV plus assay is intended as an aid in the diagnosis of influenza from Nasopharyngeal swab specimens  and should not be used as a sole basis for treatment. Nasal washings and aspirates are unacceptable for Xpert Xpress SARS-CoV-2/FLU/RSV testing.  Fact Sheet for Patients: EntrepreneurPulse.com.au  Fact Sheet for Healthcare Providers: IncredibleEmployment.be  This test is not yet approved or cleared by the [place] FDA and has been authorized for detection and/or diagnosis of SARS-CoV-2 by FDA under an Emergency Use Authorization (EUA). This EUA will remain in effect (meaning this test can be used) for the duration of the COVID-19 declaration under Section 564(b)(1) of the Act, 21 U.S.C. section 360bbb-3(b)(1), unless the authorization is terminated or revoked.  Performed at [facility], [place] [address] [place], [place] [address]   MRSA PCR Screening     Status: None   Collection Time: 03/01/21  9:00 PM   Specimen: Nasopharyngeal  Result Value Ref Range Status   MRSA by PCR NEGATIVE NEGATIVE Final    Comment:        The GeneXpert MRSA Assay (FDA approved for NASAL specimens only), is one component of a comprehensive MRSA colonization surveillance program. It is not intended to diagnose MRSA infection nor to guide or monitor treatment for MRSA infections. Performed at [facility], [place] [address], [place],  [address]   Aerobic/Anaerobic Culture w Gram Stain (surgical/deep wound)     Status: None   Collection Time: 03/02/21  2:10 PM   Specimen: Bone; Tissue  Result Value Ref Range Status   Specimen Description BONE LEFT TOE  Final   Special Requests BASE OF LEFT GREAT TOE  Final   Gram Stain   Final    RARE WBC PRESENT,BOTH PMN AND MONONUCLEAR NO ORGANISMS SEEN  Culture   Final    RARE STAPHYLOCOCCUS CAPITIS CRITICAL RESULT CALLED TO, READ BACK BY AND VERIFIED WITH: RN L.ILLERBRUN AT 2751 ON 03/05/2021 BY T.SAAD. NO ANAEROBES ISOLATED Performed at [facility], [place] [address], [place], [place]  [address]    Report Status 03/07/2021 FINAL  Final   Organism ID, Bacteria STAPHYLOCOCCUS CAPITIS  Final      Susceptibility   Staphylococcus capitis - MIC*    CIPROFLOXACIN <=0.5 SENSITIVE Sensitive     ERYTHROMYCIN <=0.25 SENSITIVE Sensitive     GENTAMICIN <=0.5 SENSITIVE Sensitive     OXACILLIN <=0.25 SENSITIVE Sensitive     TETRACYCLINE <=1 SENSITIVE Sensitive     VANCOMYCIN 1 SENSITIVE Sensitive     TRIMETH/SULFA <=10 SENSITIVE Sensitive     CLINDAMYCIN <=0.25 SENSITIVE Sensitive     RIFAMPIN <=0.5 SENSITIVE Sensitive     Inducible Clindamycin NEGATIVE Sensitive     * RARE STAPHYLOCOCCUS CAPITIS   *Note: Due to a large number of results and/or encounters for the requested time period, some results have not been displayed. A complete set of results can be found in Results Review.    Pertinent Imaging All pertinent labs/Imagings/notes reviewed. All pertinent plain films and CT images have been personally visualized and interpreted; radiology reports have been reviewed. Decision making incorporated into the Impression / Recommendations.  I have spent 60 minutes for this patient encounter including  review of prior medical records with greater than 50% of time in face to face counsel of the patient/discussing diagnostics and plan of care.   Electronically signed by:  Rosiland Oz, MD Infectious Disease Physician [facility] [address] [place], [place] [address] Phone: [phone]  Fax: [phone]

## 2021-03-31 DIAGNOSIS — A419 Sepsis, unspecified organism: Secondary | ICD-10-CM | POA: Diagnosis not present

## 2021-03-31 DIAGNOSIS — E860 Dehydration: Secondary | ICD-10-CM | POA: Diagnosis not present

## 2021-03-31 DIAGNOSIS — I5022 Chronic systolic (congestive) heart failure: Secondary | ICD-10-CM | POA: Diagnosis not present

## 2021-03-31 DIAGNOSIS — I38 Endocarditis, valve unspecified: Secondary | ICD-10-CM | POA: Diagnosis not present

## 2021-03-31 DIAGNOSIS — A4902 Methicillin resistant Staphylococcus aureus infection, unspecified site: Secondary | ICD-10-CM | POA: Diagnosis not present

## 2021-03-31 DIAGNOSIS — L03116 Cellulitis of left lower limb: Secondary | ICD-10-CM | POA: Diagnosis not present

## 2021-03-31 DIAGNOSIS — G4733 Obstructive sleep apnea (adult) (pediatric): Secondary | ICD-10-CM | POA: Diagnosis not present

## 2021-03-31 DIAGNOSIS — J189 Pneumonia, unspecified organism: Secondary | ICD-10-CM | POA: Diagnosis not present

## 2021-03-31 DIAGNOSIS — M86171 Other acute osteomyelitis, right ankle and foot: Secondary | ICD-10-CM | POA: Diagnosis not present

## 2021-03-31 DIAGNOSIS — Z5181 Encounter for therapeutic drug level monitoring: Secondary | ICD-10-CM | POA: Insufficient documentation

## 2021-04-02 DIAGNOSIS — N2581 Secondary hyperparathyroidism of renal origin: Secondary | ICD-10-CM | POA: Diagnosis not present

## 2021-04-02 DIAGNOSIS — R809 Proteinuria, unspecified: Secondary | ICD-10-CM | POA: Diagnosis not present

## 2021-04-02 DIAGNOSIS — D631 Anemia in chronic kidney disease: Secondary | ICD-10-CM | POA: Diagnosis not present

## 2021-04-02 DIAGNOSIS — E1122 Type 2 diabetes mellitus with diabetic chronic kidney disease: Secondary | ICD-10-CM | POA: Diagnosis not present

## 2021-04-02 DIAGNOSIS — R768 Other specified abnormal immunological findings in serum: Secondary | ICD-10-CM | POA: Diagnosis not present

## 2021-04-02 DIAGNOSIS — M869 Osteomyelitis, unspecified: Secondary | ICD-10-CM | POA: Diagnosis not present

## 2021-04-02 DIAGNOSIS — E875 Hyperkalemia: Secondary | ICD-10-CM | POA: Diagnosis not present

## 2021-04-02 DIAGNOSIS — N184 Chronic kidney disease, stage 4 (severe): Secondary | ICD-10-CM | POA: Diagnosis not present

## 2021-04-02 DIAGNOSIS — I129 Hypertensive chronic kidney disease with stage 1 through stage 4 chronic kidney disease, or unspecified chronic kidney disease: Secondary | ICD-10-CM | POA: Diagnosis not present

## 2021-04-03 ENCOUNTER — Telehealth: Payer: Self-pay

## 2021-04-03 NOTE — Telephone Encounter (Signed)
Called [facility] and spoke with [name] And gave verbal order to add weekly CK. Verbal order read back and understood.  Eugenia Mcalpine

## 2021-04-03 NOTE — Telephone Encounter (Signed)
-----   Message from [place] Callas, NP sent at 04/03/2021  2:55 PM EDT ----- Regarding: Can we call to add on CK weekly please? Thanks!

## 2021-04-05 ENCOUNTER — Telehealth: Payer: Self-pay | Admitting: Podiatry

## 2021-04-05 NOTE — Progress Notes (Signed)
Subjective: [name] is a 65 y.o. 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  She presents today remainder the sutures removed.  She still on IV antibiotics per infectious disease.  Denies any increase in swelling.  She is noticing occasional redness around the incision and the dressings changed.  Has not been worsening. Denies any systemic complaints such as fevers, chills, nausea, vomiting. No calf pain, chest pain, shortness of breath.   Objective: General: No acute distress, AAOx3  DP/PT pulses palpable, CRT < 3 sec to all remaining digits.  Left foot: Incision is well coapted with sutures intact however on the central aspect there is a opening of the skin.  This appears to be filling in.  There is no drainage or pus identified.  It does probe approximately 1.5 cm however there is no probing to bone.  There is no fluctuation crepitation.  There is faint erythema along the surgical site there is no significant warmth.  No fluctuation crepitation there is no malodor. No other open lesions or pre-ulcerative lesions.  No pain with calf compression, swelling, warmth, erythema.   Assessment and Plan:  Status post left partial first amputation, central area of dehiscence, mild erythema  -Treatment options discussed including all alternatives, risks, and complications -Clean the wound today.  I irrigated this as well.  We will continue with daily dressing changes. It appears to be failing any and overall the diameter or smaller. -Continue antibiotics per infectious disease -Offloading and limit weightbearing.  Elevation encouraged. -Monitor for any clinical signs or symptoms of infection and directed to call the office immediately should any occur or go to the ER.  Return in about 1 week (around 04/05/2021).  Trula Slade DPM

## 2021-04-05 NOTE — Telephone Encounter (Signed)
Mary from [facility] called stating this patient has an opening to the L wound and looks infected. She will be there tomorrow and just wanted to inform you.

## 2021-04-06 ENCOUNTER — Ambulatory Visit (INDEPENDENT_AMBULATORY_CARE_PROVIDER_SITE_OTHER): Payer: HMO | Admitting: Podiatry

## 2021-04-06 ENCOUNTER — Other Ambulatory Visit: Payer: Self-pay

## 2021-04-06 DIAGNOSIS — S98112A Complete traumatic amputation of left great toe, initial encounter: Secondary | ICD-10-CM

## 2021-04-06 DIAGNOSIS — E1149 Type 2 diabetes mellitus with other diabetic neurological complication: Secondary | ICD-10-CM

## 2021-04-08 DIAGNOSIS — E860 Dehydration: Secondary | ICD-10-CM | POA: Diagnosis not present

## 2021-04-08 DIAGNOSIS — G4733 Obstructive sleep apnea (adult) (pediatric): Secondary | ICD-10-CM | POA: Diagnosis not present

## 2021-04-08 DIAGNOSIS — A4902 Methicillin resistant Staphylococcus aureus infection, unspecified site: Secondary | ICD-10-CM | POA: Diagnosis not present

## 2021-04-08 DIAGNOSIS — M86171 Other acute osteomyelitis, right ankle and foot: Secondary | ICD-10-CM | POA: Diagnosis not present

## 2021-04-08 DIAGNOSIS — I5022 Chronic systolic (congestive) heart failure: Secondary | ICD-10-CM | POA: Diagnosis not present

## 2021-04-08 DIAGNOSIS — I38 Endocarditis, valve unspecified: Secondary | ICD-10-CM | POA: Diagnosis not present

## 2021-04-08 DIAGNOSIS — L03116 Cellulitis of left lower limb: Secondary | ICD-10-CM | POA: Diagnosis not present

## 2021-04-08 DIAGNOSIS — A419 Sepsis, unspecified organism: Secondary | ICD-10-CM | POA: Diagnosis not present

## 2021-04-08 DIAGNOSIS — J189 Pneumonia, unspecified organism: Secondary | ICD-10-CM | POA: Diagnosis not present

## 2021-04-10 ENCOUNTER — Ambulatory Visit (INDEPENDENT_AMBULATORY_CARE_PROVIDER_SITE_OTHER): Payer: HMO | Admitting: Podiatry

## 2021-04-10 ENCOUNTER — Encounter: Payer: Self-pay | Admitting: Podiatry

## 2021-04-10 ENCOUNTER — Other Ambulatory Visit: Payer: Self-pay

## 2021-04-10 VITALS — Temp 95.3°F

## 2021-04-10 DIAGNOSIS — E1169 Type 2 diabetes mellitus with other specified complication: Secondary | ICD-10-CM | POA: Diagnosis not present

## 2021-04-10 DIAGNOSIS — I4891 Unspecified atrial fibrillation: Secondary | ICD-10-CM | POA: Diagnosis not present

## 2021-04-10 DIAGNOSIS — E1149 Type 2 diabetes mellitus with other diabetic neurological complication: Secondary | ICD-10-CM

## 2021-04-10 DIAGNOSIS — E039 Hypothyroidism, unspecified: Secondary | ICD-10-CM | POA: Diagnosis not present

## 2021-04-10 DIAGNOSIS — I130 Hypertensive heart and chronic kidney disease with heart failure and stage 1 through stage 4 chronic kidney disease, or unspecified chronic kidney disease: Secondary | ICD-10-CM | POA: Diagnosis not present

## 2021-04-10 DIAGNOSIS — E1151 Type 2 diabetes mellitus with diabetic peripheral angiopathy without gangrene: Secondary | ICD-10-CM | POA: Diagnosis not present

## 2021-04-10 DIAGNOSIS — I5023 Acute on chronic systolic (congestive) heart failure: Secondary | ICD-10-CM | POA: Diagnosis not present

## 2021-04-10 DIAGNOSIS — Z9889 Other specified postprocedural states: Secondary | ICD-10-CM

## 2021-04-10 DIAGNOSIS — I252 Old myocardial infarction: Secondary | ICD-10-CM | POA: Diagnosis not present

## 2021-04-10 DIAGNOSIS — J189 Pneumonia, unspecified organism: Secondary | ICD-10-CM | POA: Diagnosis not present

## 2021-04-10 DIAGNOSIS — I428 Other cardiomyopathies: Secondary | ICD-10-CM | POA: Diagnosis not present

## 2021-04-10 DIAGNOSIS — Z89422 Acquired absence of other left toe(s): Secondary | ICD-10-CM | POA: Diagnosis not present

## 2021-04-10 DIAGNOSIS — E785 Hyperlipidemia, unspecified: Secondary | ICD-10-CM | POA: Diagnosis not present

## 2021-04-10 DIAGNOSIS — D631 Anemia in chronic kidney disease: Secondary | ICD-10-CM | POA: Diagnosis not present

## 2021-04-10 DIAGNOSIS — Z4781 Encounter for orthopedic aftercare following surgical amputation: Secondary | ICD-10-CM | POA: Diagnosis not present

## 2021-04-10 DIAGNOSIS — F32A Depression, unspecified: Secondary | ICD-10-CM | POA: Diagnosis not present

## 2021-04-10 DIAGNOSIS — N184 Chronic kidney disease, stage 4 (severe): Secondary | ICD-10-CM | POA: Diagnosis not present

## 2021-04-10 DIAGNOSIS — I2510 Atherosclerotic heart disease of native coronary artery without angina pectoris: Secondary | ICD-10-CM | POA: Diagnosis not present

## 2021-04-10 DIAGNOSIS — I447 Left bundle-branch block, unspecified: Secondary | ICD-10-CM | POA: Diagnosis not present

## 2021-04-10 DIAGNOSIS — I70202 Unspecified atherosclerosis of native arteries of extremities, left leg: Secondary | ICD-10-CM | POA: Diagnosis not present

## 2021-04-10 DIAGNOSIS — S81802D Unspecified open wound, left lower leg, subsequent encounter: Secondary | ICD-10-CM | POA: Diagnosis not present

## 2021-04-10 DIAGNOSIS — E1122 Type 2 diabetes mellitus with diabetic chronic kidney disease: Secondary | ICD-10-CM | POA: Diagnosis not present

## 2021-04-10 DIAGNOSIS — S98112A Complete traumatic amputation of left great toe, initial encounter: Secondary | ICD-10-CM

## 2021-04-10 DIAGNOSIS — I5030 Unspecified diastolic (congestive) heart failure: Secondary | ICD-10-CM | POA: Diagnosis not present

## 2021-04-10 DIAGNOSIS — E1142 Type 2 diabetes mellitus with diabetic polyneuropathy: Secondary | ICD-10-CM | POA: Diagnosis not present

## 2021-04-10 DIAGNOSIS — G4733 Obstructive sleep apnea (adult) (pediatric): Secondary | ICD-10-CM | POA: Diagnosis not present

## 2021-04-10 DIAGNOSIS — I255 Ischemic cardiomyopathy: Secondary | ICD-10-CM | POA: Diagnosis not present

## 2021-04-10 DIAGNOSIS — M869 Osteomyelitis, unspecified: Secondary | ICD-10-CM | POA: Diagnosis not present

## 2021-04-11 ENCOUNTER — Encounter: Payer: Self-pay | Admitting: Podiatry

## 2021-04-11 NOTE — Progress Notes (Signed)
Subjective: [name] is a 65 y.o. 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  She presents today remainder the sutures removed.  She still on IV antibiotics per infectious disease.  Denies any increase in swelling.  She wanted to get it checked out because there is some redness around the incision site.  Denies any systemic complaints such as fevers, chills, nausea, vomiting. No calf pain, chest pain, shortness of breath.   Objective: General: No acute distress, AAOx3  DP/PT pulses palpable, CRT < 3 sec to all remaining digits.  Left foot: Incision is well coapted with sutures intact however on the central aspect there is a opening of the skin.  This appears to be filling in.  There is no drainage or pus identified.  It does probe approximately 1.5 cm however there is no probing to bone.  There is no fluctuation crepitation.  There is some erythema along the surgical site there is no significant warmth could likely be correlated with dressings.  No fluctuation crepitation there is no malodor. No other open lesions or pre-ulcerative lesions.  No pain with calf compression, swelling, warmth, erythema.   Assessment and Plan:  Status post left partial first amputation, central area of dehiscence, mild erythema  -Treatment options discussed including all alternatives, risks, and complications -Clean the wound today.  I irrigated this as well.  We will continue with daily dressing changes.  It appears to be very stagnant and has not decreased in size or growth. -There are some maceration present therefore I encouraged her to do some Betadine wet-to-dry dressings.  No purulent drainage was expressed. -The redness may also be contributing from tight dressings and it may not be infectious in nature. -Patient will follow up with [name] for further management. -Continue antibiotics per infectious disease -Offloading and limit weightbearing.   Elevation encouraged. -Monitor for any clinical signs or symptoms of infection and directed to call the office immediately should any occur or go to the ER.  No follow-ups on file.  Felipa Furnace DPM

## 2021-04-12 ENCOUNTER — Telehealth: Payer: Self-pay

## 2021-04-12 NOTE — Telephone Encounter (Signed)
Patient called and left a voicemail stating she would like to leave her picc line in place until after she sees the surgeon on Monday 04/16/21. Patient states the surgeon is suppose to make a decision then on doing another surgery on her foot. Patient scheduled for last dose of IV antibiotics on 04/13/21. Patient does not want to have to get picc line placed again if she needs it after surgery. Can we have orders to maintain the picc line after last dose of antibiotics until follow up on 04/20/21?

## 2021-04-12 NOTE — Telephone Encounter (Signed)
Ok to have a PICC line placed for a  few days post completion of antibiotics but there is a chance of infection with  PICC lines as many long days as you keep it.

## 2021-04-13 NOTE — Telephone Encounter (Signed)
I spoke to the patient and advised her of the risk of keeping the picc line past the end date of IV antibiotics. Patient verbalized understanding and requested to leave the picc line until Monday 7/11 and will call our office to let us know what the surgeon says and we will proceed from there.

## 2021-04-13 NOTE — Telephone Encounter (Signed)
I attempted to call patient back in regards to leaving her picc line in. Patient did not answer and no secured voicemail set up.  Everard Interrante T Brooks Sailors

## 2021-04-13 NOTE — Telephone Encounter (Signed)
I spoke to [name] with [facility] and verbal orders given to maintain patient's picc line until they hear from [place] on Monday 7/11.

## 2021-04-16 ENCOUNTER — Ambulatory Visit (INDEPENDENT_AMBULATORY_CARE_PROVIDER_SITE_OTHER): Payer: HMO | Admitting: Podiatry

## 2021-04-16 ENCOUNTER — Ambulatory Visit (INDEPENDENT_AMBULATORY_CARE_PROVIDER_SITE_OTHER): Payer: HMO

## 2021-04-16 ENCOUNTER — Other Ambulatory Visit: Payer: Self-pay

## 2021-04-16 DIAGNOSIS — S98112A Complete traumatic amputation of left great toe, initial encounter: Secondary | ICD-10-CM

## 2021-04-16 NOTE — Progress Notes (Signed)
Subjective: [name] is a 65 y.o. 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 She still on IV antibiotics per infectious disease.  She follow-up with Dr. Posey [place] for increased redness.  The wound did probe and he packed the wound.  They have been performing dressing changes at home.  Denies any systemic complaints such as fevers, chills, nausea, vomiting. No calf pain, chest pain, shortness of breath.   Objective: General: No acute distress, AAOx3  DP/PT pulses palpable, CRT < 3 sec to all remaining digits.  Left foot: Incision is well coapted with sutures intact however on the central aspect there is a opening of the skin.  This appears to be filling in compared to previous measurements.  Probes approximately 1 cm today.  There is no probing to bone and there is no exposed tendon or bone.  No significant erythema on the surgical site today.  There is no significant warmth.  No fluctuance or crepitation.  No malodor. No other open lesions or pre-ulcerative lesions.  No pain with calf compression, swelling, warmth, erythema.   Assessment and Plan:  Status post left partial first amputation, central area of dehiscence, mild erythema  -Treatment options discussed including all alternatives, risks, and complications -Clean the wound today. We will continue with daily dressing changes.  I irrigated the wound.  Wound was repacked and recommended continue with this as well however appears to be filling in. -Continue antibiotics per infectious disease -Discussed possible return to the operating room for debridement of the wound. -Offloading and limit weightbearing.  Elevation encouraged. -Monitor for any clinical signs or symptoms of infection and directed to call the office immediately should any occur or go to the ER.  Trula Slade DPM

## 2021-04-16 NOTE — Patient Instructions (Signed)

## 2021-04-17 ENCOUNTER — Telehealth: Payer: Self-pay | Admitting: Family Medicine

## 2021-04-17 ENCOUNTER — Telehealth: Payer: Self-pay

## 2021-04-17 ENCOUNTER — Telehealth: Payer: Self-pay | Admitting: *Deleted

## 2021-04-17 DIAGNOSIS — T8189XA Other complications of procedures, not elsewhere classified, initial encounter: Secondary | ICD-10-CM | POA: Diagnosis not present

## 2021-04-17 NOTE — Telephone Encounter (Signed)
New message  Came to front desk with forms that need to be completed and the forms are [facility].  Forms are placed in MD box folder

## 2021-04-17 NOTE — Progress Notes (Signed)
Subjective: [name] is a 65 y.o. 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 She has finished the course of IV antibiotics. She is still packing the wound.  Denies any systemic complaints such as fevers, chills, nausea, vomiting. No calf pain, chest pain, shortness of breath.   Objective: General: No acute distress, AAOx3  DP/PT pulses palpable, CRT < 3 sec to all remaining digits.  Left foot: Incision is well coapted with sutures intact however on the central aspect there is dehiscence.  The central aspect is open and there is approximately 1 cm probing as well as it does track slightly distal as well.  No probing to bone.  There is no surrounding erythema today.  Minimal edema.  No drainage or pus today.  There is no fluctuation crepitation.  No malodor. No other open lesions or pre-ulcerative lesions.  No pain with calf compression, swelling, warmth, erythema.   Assessment and Plan:  Status post left partial first amputation, central area of dehiscence  -Treatment options discussed including all alternatives, risks, and complications -X-rays obtained reviewed.  No definitive evidence of osteomyelitis. -At this point I discussed with her return to operating room for debridement of the wound.  Discussed with her excisional wound debridement with possible resection of bone if needed.  She wants to go and proceed with this.  We will plan on doing this hopefully this week and if not then we will do it next week. -The incision placement as well as the postoperative course was discussed with the patient. I discussed risks of the surgery which include, but not limited to, infection, bleeding, pain, swelling, need for further surgery, delayed or nonhealing, painful or ugly scar, numbness or sensation changes, over/under correction, recurrence, transfer lesions, further deformity, DVT/PE, loss of toe/foot. Patient understands these risks and wishes  to proceed with surgery. The surgical consent was reviewed with the patient all 3 pages were signed. No promises or guarantees were given to the outcome of the procedure. All questions were answered to the best of my ability. Before the surgery the patient was encouraged to call the office if there is any further questions. The surgery will be performed at the [facility] on an outpatient basis.  Trula Slade DPM

## 2021-04-17 NOTE — Telephone Encounter (Signed)
error 

## 2021-04-17 NOTE — Telephone Encounter (Signed)
It's a surgical clearance form, pt is having surgery on 04/25/21. Will placed in PCP's inbox for review to see if he can clear her from hospital f/u appt in May or if he wants to work her in sometime before her appt, please advise

## 2021-04-17 NOTE — Telephone Encounter (Signed)
Nurse w/[facility] is requesting an order to pull the central line before patient's surgery on 04/18/21. Please advise.

## 2021-04-17 NOTE — Telephone Encounter (Signed)
Patient was seen by Dr. Earleen Newport yesterday and he has advised the patient to have picc line pulled. Okay to pull picc?

## 2021-04-17 NOTE — Telephone Encounter (Signed)
[name] with [facility] have been given verbal orders to remove patient's picc line per Dr. West Bali. [name] verbalized understanding. Laresha Bacorn T Brooks Sailors

## 2021-04-18 ENCOUNTER — Telehealth: Payer: Self-pay | Admitting: Urology

## 2021-04-18 ENCOUNTER — Other Ambulatory Visit: Payer: Self-pay | Admitting: Podiatry

## 2021-04-18 NOTE — Telephone Encounter (Signed)
Form done.  I don't have additional info to add and it doesn't make sense to delay treatment so that she can have OV with me in the meantime.  Thanks.

## 2021-04-18 NOTE — Telephone Encounter (Signed)
DOS - 04/25/21  EXCISION OF ULCER LEFT --- 11043 DEBRIDEMENT OF ULCER LEFT --- 12001 POSSIBLE REMOVAL OF 1ST MET --- [phone]   [employer] EFFECTIVE DATE -    Insurance claims handler FROM [employer] STATING CPT CODES 11155, [phone] AND 23361 HAVE BEEN APPROVED, AUTH # [id number], 04/25/21 - 07/24/21.

## 2021-04-18 NOTE — Telephone Encounter (Signed)
I will work on the hardcopy.  Thanks. 

## 2021-04-18 NOTE — Progress Notes (Signed)
error 

## 2021-04-18 NOTE — Progress Notes (Addendum)
COVID Vaccine Completed: yes x3 Date COVID Vaccine completed:11/21/19, 12/21/19 Has received booster:07/19/20 COVID vaccine manufacturer: Pfizer      Date of COVID positive in last 90 days: N/A  PCP - Elsie Stain, MD Cardiologist - Ida Rogue, MD Electrophysiologist- Virl Axe, MD  Chest x-ray - 11/27/20 Epic EKG - 03/27/21 Epic Stress Test - long time per pt ECHO - 03/07/20 Epic Cardiac Cath - 03/29/19 Epic Pacemaker/ICD device last checked: 12/28/20 Epic June per pt Spinal Cord Stimulator: N/A  Sleep Study - yes pos sleep apnea CPAP - yes every night  Fasting Blood Sugar - 100-150s Checks Blood Sugar  x3__ times a day  Blood Thinner Instructions: Plavix still taking, not given instructions to stop. Instructed pt to call provider and ask if she she hold until surgery. Aspirin Instructions: stop x1 week Last Dose: 04/18/21 for ASA  Activity level: Can perform activities of daily living without stopping and without symptoms of chest pain or shortness of breath. Unable to do stairs due to toe amputation and foot ulcer.    Anesthesia review: MI (2013), CHF, ejection fraction 35%, CAD, HTN, LBBB, OSA, DM, CVA, sepsis, SOB, OSA, hospitalized in May for osteomyelitis.  Patient denies shortness of breath, fever, cough and chest pain at PAT appointment   Patient verbalized understanding of instructions that were given to them at the PAT appointment. Patient was also instructed that they will need to review over the PAT instructions again at home before surgery.

## 2021-04-18 NOTE — Telephone Encounter (Signed)
Returned call to [place] and spoke with nurse to give verbal orders to remove central line. She stated that she was unable to remove because patient was having bad venous spasms.  Informed by surgery scheduler (Shelly)that the surgery has been cancelled for today,rescheduled for 04/25/21. Surgery has to done at hospital per the anesthesiologist. Returned call to [facility] [place] to give information.

## 2021-04-19 NOTE — Patient Instructions (Addendum)
DUE TO COVID-19 ONLY ONE VISITOR IS ALLOWED TO COME WITH YOU AND STAY IN THE WAITING ROOM ONLY DURING PRE OP AND PROCEDURE.   **NO VISITORS ARE ALLOWED IN THE SHORT STAY AREA OR RECOVERY ROOM!!**        Your procedure is scheduled on: 04/25/21   Report to [facility] Main  Entrance    Report to admitting at 10:30 AM   Call this number if you have problems the morning of surgery [phone]   Do not eat food :After Midnight.   May have liquids until 9:30 AM day of surgery  CLEAR LIQUID DIET  Foods Allowed                                                                     Foods Excluded  Water, Black Coffee and tea, regular and decaf               liquids that you cannot  Plain Jell-O in any flavor  (No red)                                     see through such as: Fruit ices (not with fruit pulp)                                             milk, soups, orange juice              Iced Popsicles (No red)                                                 All solid food                                   Apple juices    Sports drinks like Gatorade (No red) Lightly seasoned clear broth or consume(fat free) Sugar, honey syrup   Oral Hygiene is also important to reduce your risk of infection.                                    Remember - BRUSH YOUR TEETH THE MORNING OF SURGERY WITH YOUR REGULAR TOOTHPASTE   Do NOT smoke after Midnight   Take these medicines the morning of surgery with A SIP OF WATER: Amiodarone, Atorvastatin, Carvedilol, IMDUR, Lexothyroxine.   DO NOT TAKE ANY ORAL DIABETIC MEDICATIONS DAY OF YOUR SURGERY  How to Manage Your Diabetes Before and After Surgery  Why is it important to control my blood sugar before and after surgery? Improving blood sugar levels before and after surgery helps healing and can limit problems. A way of improving blood sugar control is eating a healthy diet by:  Eating less sugar and carbohydrates  Increasing activity/exercise   Talking with your doctor about  reaching your blood sugar goals High blood sugars (greater than 180 mg/dL) can raise your risk of infections and slow your recovery, so you will need to focus on controlling your diabetes during the weeks before surgery. Make sure that the doctor who takes care of your diabetes knows about your planned surgery including the date and location.  How do I manage my blood sugar before surgery? Check your blood sugar at least 4 times a day, starting 2 days before surgery, to make sure that the level is not too high or low. Check your blood sugar the morning of your surgery when you wake up and every 2 hours until you get to the Short Stay unit. If your blood sugar is less than 70 mg/dL, you will need to treat for low blood sugar: Do not take insulin. Treat a low blood sugar (less than 70 mg/dL) with  cup of clear juice (cranberry or apple), 4 glucose tablets, OR glucose gel. Recheck blood sugar in 15 minutes after treatment (to make sure it is greater than 70 mg/dL). If your blood sugar is not greater than 70 mg/dL on recheck, call [phone] for further instructions. Report your blood sugar to the short stay nurse when you get to Short Stay.  If you are admitted to the hospital after surgery: Your blood sugar will be checked by the staff and you will probably be given insulin after surgery (instead of oral diabetes medicines) to make sure you have good blood sugar levels. The goal for blood sugar control after surgery is 80-180 mg/dL.   WHAT DO I DO ABOUT MY DIABETES MEDICATION?  Do not take oral diabetes medicines (pills) the morning of surgery.  THE DAY BEFORE SURGERY, take 11 units of Lantus at bedtime. Take insulin aspart as prescribed before meals. Do not take bedtime insulin aspart.      THE MORNING OF SURGERY, do not take insulin aspart unless blood sugar is greater than 220, then take 1/2 normal correction dose.  If your CBG is greater than 220 mg/dL,  you may take  of your sliding scale  (correction) dose of insulin.    Reviewed and Endorsed by [facility] Patient Education Committee, August 2015                               You may not have any metal on your body including hair pins, jewelry, and body piercing             Do not wear make-up, lotions, powders, perfumes, or deodorant  Do not wear nail polish including gel and S&S, artificial/acrylic nails, or any other type of covering on natural nails including finger and toenails. If you have artificial nails, gel coating, etc. that needs to be removed by a nail salon please have this removed prior to surgery or surgery may need to be canceled/ delayed if the surgeon/ anesthesia feels like they are unable to be safely monitored.   Do not shave  48 hours prior to surgery.              Do not bring valuables to the hospital. [place].   Bring in CPAP mask and tubing day of surgery.    Patients discharged the day of surgery will not be allowed to drive home.  Special Instructions: Bring a  copy of your healthcare power of attorney and living will documents         the day of surgery if you haven't scanned them in before.  Please read over the following fact sheets you were given: IF YOU HAVE QUESTIONS ABOUT YOUR PRE OP INSTRUCTIONS PLEASE CALL [phone]- [place] - Preparing for Surgery Before surgery, you can play an important role.  Because skin is not sterile, your skin needs to be as free of germs as possible.  You can reduce the number of germs on your skin by washing with CHG (chlorahexidine gluconate) soap before surgery.  CHG is an antiseptic cleaner which kills germs and bonds with the skin to continue killing germs even after washing. Please DO NOT use if you have an allergy to CHG or antibacterial soaps.  If your skin becomes reddened/irritated stop using the CHG and inform your nurse when you arrive at Short  Stay. Do not shave (including legs and underarms) for at least 48 hours prior to the first CHG shower.  You may shave your face/neck.  Please follow these instructions carefully:  1.  Shower with CHG Soap the night before surgery and the  morning of surgery.  2.  If you choose to wash your hair, wash your hair first as usual with your normal  shampoo.  3.  After you shampoo, rinse your hair and body thoroughly to remove the shampoo.                             4.  Use CHG as you would any other liquid soap.  You can apply chg directly to the skin and wash.  Gently with a scrungie or clean washcloth.  5.  Apply the CHG Soap to your body ONLY FROM THE NECK DOWN.   Do   not use on face/ open                           Wound or open sores. Avoid contact with eyes, ears mouth and   genitals (private parts).                       Wash face,  Genitals (private parts) with your normal soap.             6.  Wash thoroughly, paying special attention to the area where your    surgery  will be performed.  7.  Thoroughly rinse your body with warm water from the neck down.  8.  DO NOT shower/wash with your normal soap after using and rinsing off the CHG Soap.                9.  Pat yourself dry with a clean towel.            10.  Wear clean pajamas.            11.  Place clean sheets on your bed the night of your first shower and do not  sleep with pets. Day of Surgery : Do not apply any lotions/deodorants the morning of surgery.  Please wear clean clothes to the hospital/surgery center.  FAILURE TO FOLLOW THESE INSTRUCTIONS MAY RESULT IN THE CANCELLATION OF YOUR SURGERY  PATIENT SIGNATURE_________________________________  NURSE SIGNATURE__________________________________  ________________________________________________________________________

## 2021-04-19 NOTE — Telephone Encounter (Signed)
I notified pt that paperwork was done. Pt requested for it to be faxed.

## 2021-04-19 NOTE — Progress Notes (Signed)
I am requesting pre-op orders in epic for day of surgery: 04/25/2021 Spoke with Caryl Pina at Dr. Leigh Aurora office Pre op appointment is on: 04/23/2021    Thank you, Harlon Flor, BSN, RN

## 2021-04-20 ENCOUNTER — Encounter: Payer: Self-pay | Admitting: Infectious Diseases

## 2021-04-20 ENCOUNTER — Other Ambulatory Visit: Payer: Self-pay | Admitting: Family Medicine

## 2021-04-20 ENCOUNTER — Other Ambulatory Visit: Payer: Self-pay

## 2021-04-20 ENCOUNTER — Telehealth: Payer: Self-pay

## 2021-04-20 ENCOUNTER — Encounter (HOSPITAL_COMMUNITY): Payer: HMO

## 2021-04-20 ENCOUNTER — Ambulatory Visit (INDEPENDENT_AMBULATORY_CARE_PROVIDER_SITE_OTHER): Payer: HMO | Admitting: Infectious Diseases

## 2021-04-20 ENCOUNTER — Other Ambulatory Visit (HOSPITAL_COMMUNITY): Payer: Self-pay | Admitting: Internal Medicine

## 2021-04-20 VITALS — BP 151/78 | HR 63 | Temp 97.6°F | Wt 275.0 lb

## 2021-04-20 DIAGNOSIS — M8610 Other acute osteomyelitis, unspecified site: Secondary | ICD-10-CM | POA: Diagnosis not present

## 2021-04-20 DIAGNOSIS — Z5181 Encounter for therapeutic drug level monitoring: Secondary | ICD-10-CM | POA: Diagnosis not present

## 2021-04-20 NOTE — Telephone Encounter (Signed)
Patient arrived for appointment with provider. Provider requested line be removed. RN spoke with Home health team who stated they weren't able to remove "PICC" due to venous spasms the patient was experiencing. They asked if we could remove it during office visit today. RN stated we could try but if it was too difficult we would refer patient ot the hospital for IR removal.   Provider verbally gave order to remove line during visit. RN went in to assess site and noticed the site was NOT a PICC line rather a CVC in her right IJ. RN notified provider that it must be removed in the hospital and contacted [facility] home health to inform them of decision.   Spoke with Bronwen Betters as well as the RN that contacted office this afternoon. It appears that multiple attempts were made to remove the line in the home and the lines sutures have been removed.   Referral/order has been placed for IR removal. Slight redness at insertion site but no other drainage or bleeding noted. Patient reports no pain and feels fine.   Egbert Seidel Lorita Officer, RN

## 2021-04-20 NOTE — Progress Notes (Addendum)
[facility]                                                             [place], [place], [place], [address]                                                                  Phn. [phone]; Fax: [phone]                                                                             Date: 04/20/21  Reason for Follow up: osteomyelitis   Assessment 65 Y O female with PMH of DM, PAD ( s/p angioplasty in the left superficial femoral artery in 4/21) with a left foot wound that seems to have started with a skin thickening  in January 2022 She is s/p several OR interventions by Podiatry - s/p I and D on 2/22 ( cultures positive for MSSA, given a course of cephalexin) s/p a course of clindamycin with no improvement in 4/9 followed by repeat I and D/biopsy of hallux and fifth metatarsal along with sesamoidectomy on 4/27, bone resection of the base of proximal phalanx and 5th metatarsal head and base of the 5th proximal phalanx with delayed primary closure on 4/30 ( discharged on Augmentin, cultures grew Amp S E faecalis,  Staph capitis and MSSA ), s/p left great toe amputation with concerns of remaining Osteomyelitis. OR cultures growing Staph capitis. Completed 6 weeks of antibiotics on 7/8  Medication Monitoring  04/10/21 Cr 1.66, ESR 85, CR 4  Plan Will coordinate with IR for central line removal  Fu with Podiatry  Fu with me as needed  Discussed at length regarding BG control and also recurrent infections leading to amputation  All questions and concerns were discussed and addressed. Patient verbalized understanding of the plan. ____________________________________________________________________________________________________________________ HPI/Interval events 03/30/21 Here for HFU for Left foot Osteomyelitis. She is accompanied by her mother. She is getting IV Daptomycin through the PICC line.  Denies any issues with the PICC line. Denies any nausea/vomiting, abdominal pain and diarrhea. She is following with Podiatry. She is also following up with Dr Jacqualyn Posey from [facility]. She denies any erythema/swelling and drainage from the surgical site. No complaints today.   04/20/21 Here for fu of Osteomyelitis of Left foot. She has completed her last dose of IV abx on 7/8. She still has a central line in place which has not been removed yet. She is also following up with Podiatry DR Jacqualyn Posey who is planning for repeat debridement on 7/20. Denies any fevers, chills and sweats. Denies nausea, vomiting, abdominal pain and diarrhea. Complains of on and off yellowish drainage from the left foot wound. Discussed with her to follow up with Dr Jacqualyn Posey and she has  completed adequate tx for osteomyelitis. I also discussed with her that DFU are hard to treat and can end up needing amputation for source control eventually.   ROS: Negative for fever, chills, activity change, appetite change, fatigue and unexpected weight change Negative for chest pain/cough and SOB Negative for GU symptoms   Past Medical History:  Diagnosis Date   Acute myocardial infarction, subendocardial infarction, initial episode of care ([place]) 07/21/2012   Acute osteomyelitis involving ankle and foot ([place]) 4/0/9811   Acute systolic heart failure ([place]) 07/21/2012   New onset 07/19/12; admission to [facility] ED. Elevated Troponins.  S/p 2D-echo with EF 20-25%.  S/p cardiac catheterization with stenting LAD.  Repeat 2D-echo 10/2011 with improved EF of 35%.    Anemia    Automatic implantable cardioverter-defibrillator in situ    a. MDT CRT-D 06/2014, SN: [id number]  .removed in feb 2020   CAD (coronary artery disease)    a. cardiac cath 101/04/2012: PCI/DES to chronically occluded mLAD, consideration PCI to diag branch in 4 weeks.    Cataract    Chicken pox    Chronic kidney disease    Chronic systolic CHF (congestive heart failure) (HCC)     a. mixed ICM & NICM; b. EF 20-25% by echo 07/2012, mid-dist 2/3 of LV sev HK/AK, mild MR. echo 10/2012: EF 30-35%, sev HK ant-septal & inf walls, GR1DD, mild MR, PASP 33. c. echo 02/2013: EF 30%, GR1DD, mild MR. echo 04/2014: EF 30%, Septal-lat dyssynchrony, global HK, inf AK, GR1DD, mild MR. d. echo 10/2014: EF50-55%, WM nl, GR1DD, septal mild paradox. e. echo 02/2015: EF 50-55%, wm    Depression    Heart attack ([place])    Heel ulcer ([place]) 04/27/2015   History of blood transfusion ~ 2011   "plasma; had neuropathy; couldn't walk"   Hypertension    LBBB (left bundle branch block)    Neuromuscular disorder ([place])    Neuropathy 2011   Obesity, unspecified    OSA on CPAP    Moderate with AHI 23/hr and now on CPAP at 16cm H2O.  Her DME is [facility]   Pure hypercholesterolemia    Sepsis ([place]) 09/2018   Type II diabetes mellitus ([place])    Unspecified vitamin D deficiency    Past Surgical History:  Procedure Laterality Date   ABDOMINAL AORTOGRAM W/LOWER EXTREMITY N/A 02/02/2021   Procedure: ABDOMINAL AORTOGRAM W/LOWER EXTREMITY;  Surgeon: Angelia Mould, MD;  Location: [facility];  Service: Cardiovascular;  Laterality: N/A;   AMPUTATION Left 03/02/2021   Procedure: AMPUTATION RAY, left great toe;  Surgeon: Trula Slade, DPM;  Location: [facility];  Service: Podiatry;  Laterality: Left;   AMPUTATION TOE Right 06/18/2015   Procedure: AMPUTATION TOE;  Surgeon: Samara Deist, DPM;  Location: [facility];  Service: Podiatry;  Laterality: Right;   AMPUTATION TOE Left 10/08/2018   Procedure: AMPUTATION TOE LEFT 2ND;  Surgeon: Albertine Patricia, DPM;  Location: [facility];  Service: Podiatry;  Laterality: Left;   BACK SURGERY     BI-VENTRICULAR IMPLANTABLE CARDIOVERTER DEFIBRILLATOR N/A 07/06/2014   Procedure: BI-VENTRICULAR IMPLANTABLE CARDIOVERTER DEFIBRILLATOR  (CRT-D);  Surgeon: Deboraha Sprang, MD;  Location: [facility] C[facility] LAB;  Service: Cardiovascular;  Laterality: N/A;   BI-VENTRICULAR IMPLANTABLE  CARDIOVERTER DEFIBRILLATOR  (CRT-D)  07/06/2014   BILATERAL OOPHORECTOMY  01/2011   ovarian cyst benign   BIV ICD INSERTION CRT-D N/A 03/31/2019   Procedure: BIV ICD INSERTION CRT-D;  Surgeon: Constance Haw, MD;  Location: [facility];  Service: Cardiovascular;  Laterality: N/A;   COLONOSCOPY WITH PROPOFOL Left 02/22/2015   Procedure: COLONOSCOPY WITH PROPOFOL;  Surgeon: Hulen Luster, MD;  Location: [facility]OSCOPY;  Service: Endoscopy;  Laterality: Left;   CORONARY ANGIOPLASTY WITH STENT PLACEMENT Left 07/2012   new onset systolic CHF; elevated troponins.  Cardiac catheterization with stenting to LAD; EF 15%.  2D-echo: EF 20-25%.   ESOPHAGOGASTRODUODENOSCOPY N/A 02/22/2015   Procedure: ESOPHAGOGASTRODUODENOSCOPY (EGD);  Surgeon: Hulen Luster, MD;  Location: [facility] ENDOSCOPY;  Service: Endoscopy;  Laterality: N/A;   I & D EXTREMITY Left 11/28/2020   Procedure: IRRIGATION AND DEBRIDEMENT OF FOOT;  Surgeon: Trula Slade, DPM;  Location: [facility];  Service: Podiatry;  Laterality: Left;   I & D EXTREMITY Left 01/31/2021   Procedure: IRRIGATION AND DEBRIDEMENT EXTREMITY OF LEFT FOOT WITH BONE BIOPSY OF  1ST METATARSAL AND FIFTH METATARSAL;  Surgeon: Felipa Furnace, DPM;  Location: [facility];  Service: Podiatry;  Laterality: Left;   I & D EXTREMITY Left 02/03/2021   Procedure: IRRIGATION AND DEBRIDEMENT EXTREMITY WITH INTERNAL AMPUTATION FIRST AND FIFTH RAY;  Surgeon: Felipa Furnace, DPM;  Location: [facility];  Service: Podiatry;  Laterality: Left;   INCISION AND DRAINAGE ABSCESS Right 2007   groin; with ICU stay due to sepsis.   IR FLUORO GUIDE CV LINE RIGHT  03/06/2021   IR US GUIDE VASC ACCESS RIGHT  03/06/2021   LAPAROSCOPIC CHOLECYSTECTOMY  2011   LEFT HEART CATH AND CORONARY ANGIOGRAPHY N/A 10/05/2018   Procedure: LEFT HEART CATH AND CORONARY ANGIOGRAPHY;  Surgeon: Minna Merritts, MD;  Location: [facility];  Service: Cardiovascular;  Laterality: N/A;   LEFT HEART CATHETERIZATION WITH  CORONARY ANGIOGRAM N/A 07/21/2012   Procedure: LEFT HEART CATHETERIZATION WITH CORONARY ANGIOGRAM;  Surgeon: Jolaine Artist, MD;  Location: [facility] CATH LAB;  Service: Cardiovascular;  Laterality: N/A;   LUMBAR [place]   L4-5   PACEMAKER REMOVAL  11/2018   due to infection around pacemaker   [place] (PCI-S) N/A 07/23/2012   Procedure: PERCUTANEOUS CORONARY STENT INTERVENTION (PCI-S);  Surgeon: Sherren Mocha, MD;  Location: [facility] LAB;  Service: Cardiovascular;  Laterality: N/A;   PERIPHERAL VASCULAR BALLOON ANGIOPLASTY Left 10/06/2018   Procedure: PERIPHERAL VASCULAR BALLOON ANGIOPLASTY;  Surgeon: Katha Cabal, MD;  Location: [facility];  Service: Cardiovascular;  Laterality: Left;   PERIPHERAL VASCULAR CATHETERIZATION N/A 02/10/2015   Procedure: Picc Line Insertion;  Surgeon: Katha Cabal, MD;  Location: [facility];  Service: Cardiovascular;  Laterality: N/A;   RIGHT HEART CATH N/A 03/29/2019   Procedure: RIGHT HEART CATH;  Surgeon: Jolaine Artist, MD;  Location: [facility];  Service: Cardiovascular;  Laterality: N/A;   TEE WITHOUT CARDIOVERSION N/A 10/02/2018   Procedure: TRANSESOPHAGEAL ECHOCARDIOGRAM (TEE);  Surgeon: Wellington Hampshire, MD;  Location: [facility];  Service: Cardiovascular;  Laterality: N/A;   [place]  01/2011   Fibroids/DUB.  Ovaries removed. [place].   Current Outpatient Medications on File Prior to Visit  Medication Sig Dispense Refill   amiodarone (PACERONE) 200 MG tablet Take 1 tablet (200 mg total) by mouth daily. 90 tablet 1   aspirin EC 81 MG EC tablet Take 1 tablet (81 mg total) by mouth daily. Swallow whole. (Patient not taking: No sig reported) 30 tablet 11   atorvastatin (LIPITOR) 10 MG tablet Take 1 tablet (10 mg total) by mouth daily. 30 tablet  11   calcitRIOL (ROCALTROL) 0.5 MCG capsule Take 0.5 mcg by mouth every morning.     Calcium  Carb-Cholecalciferol (CALCIUM CARBONATE-VITAMIN D3) 600-400 MG-UNIT TABS Take 1 tablet by mouth 2 (two) times a day.     carvedilol (COREG) 3.125 MG tablet Take 3.125 mg by mouth every morning.     clopidogrel (PLAVIX) 75 MG tablet TAKE 1 TABLET BY MOUTH ONCE DAILY (Patient taking differently: Take 75 mg by mouth daily.) 90 tablet 1   DULoxetine (CYMBALTA) 30 MG capsule Take 1 capsule (30 mg total) by mouth at bedtime. 90 capsule 3   ferrous sulfate 325 (65 FE) MG tablet Take 325 mg by mouth 2 (two) times daily with a meal.     gabapentin (NEURONTIN) 600 MG tablet Take 1 tablet (600 mg total) by mouth at bedtime.     insulin aspart (NOVOLOG FLEXPEN) 100 UNIT/ML FlexPen Inject 5-10 Units into the skin 3 (three) times daily before meals. Sugar 100-149 - 5 units.  150-199- 6 units.  200-249- 7 units.  250-299- 8 units. 300-349- 9 units.  350+ 10 units. 15 mL    Insulin Pen Needle (PEN NEEDLES) 32G X 6 MM MISC 1 each by Does not apply route 4 (four) times daily. 150 each 11   isosorbide mononitrate (IMDUR) 30 MG 24 hr tablet Take 1 tablet (30 mg total) by mouth daily. Needs appt 90 tablet 3   LANTUS SOLOSTAR 100 UNIT/ML Solostar Pen INJECT 22 UNITS UNDER THE SKIN DAILY. (Patient taking differently: Inject 22 Units into the skin at bedtime.) 15 mL 5   levothyroxine (SYNTHROID) 50 MCG tablet TAKE 1 TABLET BY MOUTH ONCE DAILY BEFOREBREAKFAST (Patient taking differently: Take 50 mcg by mouth daily before breakfast.) 90 tablet 1   Multiple Vitamin (MULTIVITAMIN WITH MINERALS) TABS tablet Take 1 tablet by mouth every morning.     nitroGLYCERIN (NITROSTAT) 0.4 MG SL tablet Place 1 tablet (0.4 mg total) under the tongue every 5 (five) minutes as needed for chest pain. 25 tablet 3   Omega-3 Fatty Acids (FISH OIL) 1000 MG CAPS Take 1,000 mg by mouth 2 (two) times a day.     ONETOUCH ULTRA test strip UUD TO CHECK BLOOD SUGAR 3 TIMES DAILY 300 each 2   traMADol (ULTRAM) 50 MG tablet Take 1-2 tablets (50-100 mg total)  by mouth at bedtime as needed. (Patient taking differently: Take 100 mg by mouth at bedtime.) 60 tablet 1   No current facility-administered medications on file prior to visit.     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  Tobacco Use   Smoking status: Never   Smokeless tobacco: Never  Vaping Use   Vaping Use: Never used  Substance and Sexual Activity   Alcohol use: No    Alcohol/week: 0.0 standard drinks   Drug use: No   Sexual activity: Not Currently    Birth control/protection: Post-menopausal, Surgical  Other Topics Concern   Not on file  Social History Narrative  Widowed, husband died when she was in surgery.        Children:  2 children (daughter, son). Two grandsons and 2 step grandchildren.      Lives: with husband, daughter, son-in-law, 2 grandsons, and 1 on the way.      Employment: disability for peripheral neuropathy 2012.  Previously had home daycare.   Social Determinants of Health   Financial Resource Strain: Low Risk    Difficulty of Paying Living Expenses: Not hard at all  Food Insecurity: No Food Insecurity   Worried About Charity fundraiser in the Last Year: Never true   [place] in the Last Year: Never true  Transportation Needs: No Transportation Needs   Lack of Transportation (Medical): No   Lack of Transportation (Non-Medical): No  Physical Activity: Inactive   Days of Exercise per Week: 0 days   Minutes of Exercise per Session: 0 min  Stress: No Stress Concern Present   Feeling of Stress : Not at all  Social Connections: Not  on file  Intimate Partner Violence: Not At Risk   Fear of Current or Ex-Partner: No   Emotionally Abused: No   Physically Abused: No   Sexually Abused: No    Vitals BP (!) 151/78   Pulse 63   Temp 97.6 F (36.4 C) (Oral)   Wt 275 lb (124.7 kg)   SpO2 99%   BMI 41.81 kg/m     Examination  General - not in acute distress, comfortably sitting in chair HEENT - PEERLA, no pallor and no icterus Chest - b/l clear air entry, no additional sounds CVS- Normal s1s2, RRR Abdomen - Soft Neuro: grossly normal Psych : calm and cooperative Left foot         Recent labs CBC Latest Ref Rng & Units 03/07/2021 03/05/2021 03/04/2021  WBC 4.0 - 10.5 K/uL 7.7 6.8 8.4  Hemoglobin 12.0 - 15.0 g/dL 10.8(L) 9.2(L) 9.7(L)  Hematocrit 36.0 - 46.0 % 32.6(L) 28.2(L) 29.7(L)  Platelets 150 - 400 K/uL 180 141(L) 147(L)   CMP Latest Ref Rng & Units 03/07/2021 03/06/2021 03/05/2021  Glucose 70 - 99 mg/dL 123(H) 150(H) 174(H)  BUN 8 - 23 mg/dL 37(H) 42(H) 52(H)  Creatinine 0.44 - 1.00 mg/dL 1.58(H) 1.58(H) 1.85(H)  Sodium 135 - 145 mmol/L 139 136 136  Potassium 3.5 - 5.1 mmol/L 4.2 3.9 4.1  Chloride 98 - 111 mmol/L 108 107 107  CO2 22 - 32 mmol/L 19(L) 21(L) 20(L)  Calcium 8.9 - 10.3 mg/dL 9.2 8.9 8.7(L)  Total Protein 6.5 - 8.1 g/dL - - -  Total Bilirubin 0.3 - 1.2 mg/dL - - -  Alkaline Phos 38 - 126 U/L - - -  AST 15 - 41 U/L - - -  ALT 0 - 44 U/L - - -    Pertinent Microbiology Results for orders placed or performed during the hospital encounter of 02/26/21  Culture, blood (routine x 2)     Status: None   Collection Time: 02/26/21  7:00 PM   Specimen: BLOOD RIGHT ARM  Result Value Ref Range Status   Specimen Description BLOOD RIGHT ARM  Final   Special Requests   Final    BOTTLES DRAWN AEROBIC AND ANAEROBIC Blood Culture adequate volume   Culture   Final    NO GROWTH 5 DAYS Performed at [facility], [address], [place], [place] [address]    Report Status 03/03/2021 FINAL   Final  Culture, blood (routine x  2)     Status: None   Collection Time: 02/26/21  7:15 PM   Specimen: BLOOD LEFT ARM  Result Value Ref Range Status   Specimen Description BLOOD LEFT ARM  Final   Special Requests   Final    BOTTLES DRAWN AEROBIC ONLY Blood Culture results may not be optimal due to an excessive volume of blood received in culture bottles   Culture   Final    NO GROWTH 5 DAYS Performed at [facility], [place] [address], [place], [place] [address]    Report Status 03/03/2021 FINAL  Final  Resp Panel by RT-PCR (Flu A&B, Covid) Nasopharyngeal Swab     Status: None   Collection Time: 02/27/21  3:22 AM   Specimen: Nasopharyngeal Swab; Nasopharyngeal(NP) swabs in vial transport medium  Result Value Ref Range Status   SARS Coronavirus 2 by RT PCR NEGATIVE NEGATIVE Final    Comment: (NOTE) SARS-CoV-2 target nucleic acids are NOT DETECTED.  The SARS-CoV-2 RNA is generally detectable in upper respiratory specimens during the acute phase of infection. The lowest concentration of SARS-CoV-2 viral copies this assay can detect is 138 copies/mL. A negative result does not preclude SARS-Cov-2 infection and should not be used as the sole basis for treatment or other patient management decisions. A negative result may occur with  improper specimen collection/handling, submission of specimen other than nasopharyngeal swab, presence of viral mutation(s) within the areas targeted by this assay, and inadequate number of viral copies(<138 copies/mL). A negative result must be combined with clinical observations, patient history, and epidemiological information. The expected result is Negative.  Fact Sheet for Patients:  EntrepreneurPulse.com.au  Fact Sheet for Healthcare Providers:  IncredibleEmployment.be  This test is no t yet approved or cleared by the [place] FDA and  has been authorized for detection and/or diagnosis of SARS-CoV-2  by FDA under an Emergency Use Authorization (EUA). This EUA will remain  in effect (meaning this test can be used) for the duration of the COVID-19 declaration under Section 564(b)(1) of the Act, 21 U.S.C.section 360bbb-3(b)(1), unless the authorization is terminated  or revoked sooner.       Influenza A by PCR NEGATIVE NEGATIVE Final   Influenza B by PCR NEGATIVE NEGATIVE Final    Comment: (NOTE) The Xpert Xpress SARS-CoV-2/FLU/RSV plus assay is intended as an aid in the diagnosis of influenza from Nasopharyngeal swab specimens and should not be used as a sole basis for treatment. Nasal washings and aspirates are unacceptable for Xpert Xpress SARS-CoV-2/FLU/RSV testing.  Fact Sheet for Patients: EntrepreneurPulse.com.au  Fact Sheet for Healthcare Providers: IncredibleEmployment.be  This test is not yet approved or cleared by the [place] FDA and has been authorized for detection and/or diagnosis of SARS-CoV-2 by FDA under an Emergency Use Authorization (EUA). This EUA will remain in effect (meaning this test can be used) for the duration of the COVID-19 declaration under Section 564(b)(1) of the Act, 21 U.S.C. section 360bbb-3(b)(1), unless the authorization is terminated or revoked.  Performed at [facility], [place] [address], [place], [place] [address]   MRSA PCR Screening     Status: None   Collection Time: 03/01/21  9:00 PM   Specimen: Nasopharyngeal  Result Value Ref Range Status   MRSA by PCR NEGATIVE NEGATIVE Final    Comment:        The GeneXpert MRSA Assay (FDA approved for NASAL specimens only), is one component of a comprehensive MRSA colonization surveillance program. It is not intended  to diagnose MRSA infection nor to guide or monitor treatment for MRSA infections. Performed at [facility], [place] [address], [place], [place] [address]   Aerobic/Anaerobic Culture w Gram Stain (surgical/deep wound)      Status: None   Collection Time: 03/02/21  2:10 PM   Specimen: Bone; Tissue  Result Value Ref Range Status   Specimen Description BONE LEFT TOE  Final   Special Requests BASE OF LEFT GREAT TOE  Final   Gram Stain   Final    RARE WBC PRESENT,BOTH PMN AND MONONUCLEAR NO ORGANISMS SEEN    Culture   Final    RARE STAPHYLOCOCCUS CAPITIS CRITICAL RESULT CALLED TO, READ BACK BY AND VERIFIED WITH: RN L.ILLERBRUN AT 5790 ON 03/05/2021 BY T.SAAD. NO ANAEROBES ISOLATED Performed at [facility], [place] [address], [place], [place] [address]    Report Status 03/07/2021 FINAL  Final   Organism ID, Bacteria STAPHYLOCOCCUS CAPITIS  Final      Susceptibility   Staphylococcus capitis - MIC*    CIPROFLOXACIN <=0.5 SENSITIVE Sensitive     ERYTHROMYCIN <=0.25 SENSITIVE Sensitive     GENTAMICIN <=0.5 SENSITIVE Sensitive     OXACILLIN <=0.25 SENSITIVE Sensitive     TETRACYCLINE <=1 SENSITIVE Sensitive     VANCOMYCIN 1 SENSITIVE Sensitive     TRIMETH/SULFA <=10 SENSITIVE Sensitive     CLINDAMYCIN <=0.25 SENSITIVE Sensitive     RIFAMPIN <=0.5 SENSITIVE Sensitive     Inducible Clindamycin NEGATIVE Sensitive     * RARE STAPHYLOCOCCUS CAPITIS   *Note: Due to a large number of results and/or encounters for the requested time period, some results have not been displayed. A complete set of results can be found in Results Review.    Pertinent Imaging All pertinent labs/Imagings/notes reviewed. All pertinent plain films and CT images have been personally visualized and interpreted; radiology reports have been reviewed. Decision making incorporated into the Impression / Recommendations.  I have spent 60 minutes for this patient encounter including  review of prior medical records with greater than 50% of time in face to face counsel of the patient/discussing diagnostics and plan of care.   Electronically signed by:  Rosiland Oz, MD Infectious Disease Physician [facility] [address] [name], [place] [address] Phone: [phone]  Fax: [phone]

## 2021-04-20 NOTE — Telephone Encounter (Signed)
Left voicemail with IR to schedule appointment for central line removal. Will need appointment as soon as possible. Patient does not have a preferred time/day. Will call patient with appointment information once scheduled. Leatrice Jewels, RMA

## 2021-04-20 NOTE — Telephone Encounter (Signed)
Patient's home health RN attempted to remove patient's PICC line today during home visit. Unable to remove line due to venous spasm. RN asked if possible for team to attempt to remove it today during office visit. RN stated we could attempt to pull it however if there are issues, we'd refer her to the hospital for removal.   Carlean Purl, RN

## 2021-04-23 ENCOUNTER — Other Ambulatory Visit: Payer: Self-pay

## 2021-04-23 ENCOUNTER — Encounter (HOSPITAL_COMMUNITY): Payer: Self-pay

## 2021-04-23 ENCOUNTER — Encounter (HOSPITAL_COMMUNITY)
Admission: RE | Admit: 2021-04-23 | Discharge: 2021-04-23 | Disposition: A | Discharge status: Home / Self Care | Payer: HMO | Source: Ambulatory Visit | Attending: Podiatry | Admitting: Podiatry

## 2021-04-23 DIAGNOSIS — Z01812 Encounter for preprocedural laboratory examination: Secondary | ICD-10-CM | POA: Diagnosis not present

## 2021-04-23 HISTORY — DX: Hypothyroidism, unspecified: E03.9

## 2021-04-23 LAB — BASIC METABOLIC PANEL
Anion gap: 10 (ref 5–15)
BUN: 38 mg/dL — ABNORMAL HIGH (ref 8–23)
CO2: 26 mmol/L (ref 22–32)
Calcium: 9.2 mg/dL (ref 8.9–10.3)
Chloride: 103 mmol/L (ref 98–111)
Creatinine, Ser: 1.77 mg/dL — ABNORMAL HIGH (ref 0.44–1.00)
GFR, Estimated: 31 mL/min — ABNORMAL LOW (ref >60)
Glucose, Bld: 243 mg/dL — ABNORMAL HIGH (ref 70–99)
Potassium: 4.7 mmol/L (ref 3.5–5.1)
Sodium: 139 mmol/L (ref 135–145)

## 2021-04-23 LAB — HEMOGLOBIN A1C
Hgb A1c MFr Bld: 6.8 % — ABNORMAL HIGH (ref 4.8–5.6)
Mean Plasma Glucose: 148.46 mg/dL

## 2021-04-23 NOTE — Addendum Note (Signed)
Addended by: Leatrice Jewels on: 04/23/2021 11:58 AM   Modules accepted: Orders

## 2021-04-23 NOTE — Telephone Encounter (Addendum)
Appointment reschedule for Friday due to schedule conflict. Not able to reach patient at this time with appointment informaiton. Will update Home health to continue to manage central line until IR appt. Orders relayed to [facility] with [facility]. Leatrice Jewels, RMA

## 2021-04-23 NOTE — Telephone Encounter (Signed)
Patient scheduled with IR on 7/20 at 12 at [facility]. Will need to arrive 15 mins prior for check in.  Left voicemail with appointment information. Requested patient call office back to confirm or reschedule if needed. Leatrice Jewels, RMA

## 2021-04-23 NOTE — Progress Notes (Signed)
Creatinine came back 1.77. Routed to [name].

## 2021-04-24 ENCOUNTER — Encounter: Payer: HMO | Admitting: Podiatry

## 2021-04-24 ENCOUNTER — Encounter: Payer: Self-pay | Admitting: Internal Medicine

## 2021-04-24 ENCOUNTER — Ambulatory Visit (INDEPENDENT_AMBULATORY_CARE_PROVIDER_SITE_OTHER): Payer: HMO | Admitting: Podiatry

## 2021-04-24 ENCOUNTER — Encounter: Payer: Self-pay | Admitting: Podiatry

## 2021-04-24 DIAGNOSIS — M8600 Acute hematogenous osteomyelitis, unspecified site: Secondary | ICD-10-CM | POA: Diagnosis not present

## 2021-04-24 DIAGNOSIS — E1149 Type 2 diabetes mellitus with other diabetic neurological complication: Secondary | ICD-10-CM | POA: Diagnosis not present

## 2021-04-24 LAB — GLUCOSE, CAPILLARY: Glucose-Capillary: 259 mg/dL — ABNORMAL HIGH (ref 70–99)

## 2021-04-24 NOTE — Progress Notes (Signed)
PERIOPERATIVE PRESCRIPTION FOR IMPLANTED CARDIAC DEVICE PROGRAMMING  Patient Information: Name:  [name]  DOB:  1956-06-03  MRN:  [id number]    Planned Procedure:  Excision and debridement of  left foot ulcer  Surgeon:  Dr. Celesta Gentile  Date of Procedure:  04/25/2021  Position during surgery:  unknown   Please send documentation back to:  [facility] (Fax # [phone])   Device Information:  Clinic EP Physician:  Virl Axe, MD   Device Type:  Defibrillator Manufacturer and Phone #:  [employer]tronic: [id number] Pacemaker Dependent?:  No. Date of Last Device Check:  01/30/21 Normal Device Function?:  Yes.    Electrophysiologist's Recommendations:  Have magnet available. Provide continuous ECG monitoring when magnet is used or reprogramming is to be performed.  Procedure should not interfere with device function.  No device programming or magnet placement needed.  Per [facility] [address] Simone Curia, RN  8:42 AM 04/24/2021

## 2021-04-24 NOTE — Telephone Encounter (Signed)
Was able to reach patient this morning to relay appointment information. Patient is okay with appointment date/time. Will call office with any questions/ concerns.  Leatrice Jewels, RMA

## 2021-04-24 NOTE — Progress Notes (Signed)
Anesthesia Chart Review   Case: [id number] Date/Time: 04/25/21 1215   Procedure: EXCISION AND DEBRIDEMENT WOUND ULCER LEFT FOOT;POSSIBLE METATARSECTOMY (Left)   Anesthesia type: Choice   Pre-op diagnosis: WOUND ULCER LEFT FOOT   [facility]: [facility]   Surgeons: Trula Slade, DPM       DISCUSSION:65 y.o. never smoker with h/o HTN, LBBB, CAD, nonischemic cardiomyopathy s/p AICD (Device orders in progress note 04/24/2021) EF 50-55%, DM II, OSA, CKD, wound ulcer left foot scheduled for above procedure 04/25/2021 with Celesta Gentile, DPM.   Pt last seen by cardiology 03/27/2021. Per OV note volume status stable, no ischemic symptoms, no medication changes.   Anticipate pt can proceed with planned procedure barring acute status change and after evaluation DOS by anesthesia, assess volume status.  VS: BP (!) 150/66   Pulse (!) 58   Temp 36.6 C (Oral)   Resp 18   Ht 5\' 8"  (1.727 m)   Wt 122.7 kg   SpO2 99%   BMI 41.11 kg/m   PROVIDERS: [name], MD is PCP   Virl Axe, MD is Cardiologist  LABS: Labs reviewed: Acceptable for surgery. (all labs ordered are listed, but only abnormal results are displayed)  Labs Reviewed  HEMOGLOBIN A1C - Abnormal; Notable for the following components:      Result Value   Hgb A1c MFr Bld 6.8 (*)    All other components within normal limits  BASIC METABOLIC PANEL - Abnormal; Notable for the following components:   Glucose, Bld 243 (*)    BUN 38 (*)    Creatinine, Ser 1.77 (*)    GFR, Estimated 31 (*)    All other components within normal limits  GLUCOSE, CAPILLARY - Abnormal; Notable for the following components:   Glucose-Capillary 259 (*)    All other components within normal limits     IMAGES:   EKG: 03/27/2021 Rate 62 bpm   CV: Echo 03/07/2020 IMPRESSIONS     1. Left ventricular ejection fraction, by estimation, is 50 to 55%. The  left ventricle has low normal function. The left ventricle has no regional   wall motion abnormalities. Left ventricular diastolic parameters are  consistent with Grade I diastolic  dysfunction (impaired relaxation).   2. Right ventricular systolic function is low normal.   3. Mild to moderate mitral valve regurgitation.  Past Medical History:  Diagnosis Date   Acute myocardial infarction, subendocardial infarction, initial episode of care ([place]) 07/21/2012   Acute osteomyelitis involving ankle and foot ([place]) 44/31/5400   Acute systolic heart failure ([place]) 07/21/2012   New onset 07/19/12; admission to [facility] ED. Elevated Troponins.  S/p 2D-echo with EF 20-25%.  S/p cardiac catheterization with stenting LAD.  Repeat 2D-echo 10/2011 with improved EF of 35%.    Anemia    Automatic implantable cardioverter-defibrillator in situ    a. MDT CRT-D 06/2014, SN: [id number]  .removed in feb 2020   CAD (coronary artery disease)    a. cardiac cath 101/04/2012: PCI/DES to chronically occluded mLAD, consideration PCI to diag branch in 4 weeks.    Cataract    Chicken pox    Chronic kidney disease    Chronic systolic CHF (congestive heart failure) (HCC)    a. mixed ICM & NICM; b. EF 20-25% by echo 07/2012, mid-dist 2/3 of LV sev HK/AK, mild MR. echo 10/2012: EF 30-35%, sev HK ant-septal & inf walls, GR1DD, mild MR, PASP 33. c. echo 02/2013: EF 30%, GR1DD, mild MR. echo  04/2014: EF 30%, Septal-lat dyssynchrony, global HK, inf AK, GR1DD, mild MR. d. echo 10/2014: EF50-55%, WM nl, GR1DD, septal mild paradox. e. echo 02/2015: EF 50-55%, wm    Depression    Heart attack ([place])    Heel ulcer ([place]) 04/27/2015   History of blood transfusion ~ 2011   "plasma; had neuropathy; couldn't walk"   Hypertension    Hypothyroidism    LBBB (left bundle branch block)    Neuromuscular disorder ([place])    Neuropathy 2011   Obesity, unspecified    OSA on CPAP    Moderate with AHI 23/hr and now on CPAP at 16cm H2O.  Her DME is [facility]   Pure hypercholesterolemia    Sepsis ([place]) 09/2018   Type II diabetes  mellitus ([place])    Unspecified vitamin D deficiency     Past Surgical History:  Procedure Laterality Date   ABDOMINAL AORTOGRAM W/LOWER EXTREMITY N/A 02/02/2021   Procedure: ABDOMINAL AORTOGRAM W/LOWER EXTREMITY;  Surgeon: Angelia Mould, MD;  Location: [place] [facility];  Service: Cardiovascular;  Laterality: N/A;   AMPUTATION Left 03/02/2021   Procedure: AMPUTATION RAY, left great toe;  Surgeon: Trula Slade, DPM;  Location: [place];  Service: Podiatry;  Laterality: Left;   AMPUTATION TOE Right 06/18/2015   Procedure: AMPUTATION TOE;  Surgeon: Samara Deist, DPM;  Location: [facility] OR[facility];  Service: Podiatry;  Laterality: Right;   AMPUTATION TOE Left 10/08/2018   Procedure: AMPUTATION TOE LEFT 2ND;  Surgeon: Albertine Patricia, DPM;  Location: [facility] OR[facility];  Service: Podiatry;  Laterality: Left;   BACK SURGERY     BI-VENTRICULAR IMPLANTABLE CARDIOVERTER DEFIBRILLATOR N/A 07/06/2014   Procedure: BI-VENTRICULAR IMPLANTABLE CARDIOVERTER DEFIBRILLATOR  (CRT-D);  Surgeon: Deboraha Sprang, MD;  Location: [facility] CATH LAB;  Service: Cardiovascular;  Laterality: N/A;   BI-VENTRICULAR IMPLANTABLE CARDIOVERTER DEFIBRILLATOR  (CRT-D)  07/06/2014   BILATERAL OOPHORECTOMY  01/2011   ovarian cyst benign   BIV ICD INSERTION CRT-D N/A 03/31/2019   Procedure: BIV ICD INSERTION CRT-D;  Surgeon: Constance Haw, MD;  Location: [place] [facility];  Service: Cardiovascular;  Laterality: N/A;   COLONOSCOPY WITH PROPOFOL Left 02/22/2015   Procedure: COLONOSCOPY WITH PROPOFOL;  Surgeon: Hulen Luster, MD;  Location: [facility] ENDOSCOPY;  Service: Endoscopy;  Laterality: Left;   CORONARY ANGIOPLASTY WITH STENT PLACEMENT Left 07/2012   new onset systolic CHF; elevated troponins.  Cardiac catheterization with stenting to LAD; EF 15%.  2D-echo: EF 20-25%.   ESOPHAGOGASTRODUODENOSCOPY N/A 02/22/2015   Procedure: ESOPHAGOGASTRODUODENOSCOPY (EGD);  Surgeon: Hulen Luster, MD;  Location: [facility] ENDOSCOPY;  Service: Endoscopy;  Laterality: N/A;    I & D EXTREMITY Left 11/28/2020   Procedure: IRRIGATION AND DEBRIDEMENT OF FOOT;  Surgeon: Trula Slade, DPM;  Location: [place];  Service: Podiatry;  Laterality: Left;   I & D EXTREMITY Left 01/31/2021   Procedure: IRRIGATION AND DEBRIDEMENT EXTREMITY OF LEFT FOOT WITH BONE BIOPSY OF  1ST METATARSAL AND FIFTH METATARSAL;  Surgeon: Felipa Furnace, DPM;  Location: [place];  Service: Podiatry;  Laterality: Left;   I & D EXTREMITY Left 02/03/2021   Procedure: IRRIGATION AND DEBRIDEMENT EXTREMITY WITH INTERNAL AMPUTATION FIRST AND FIFTH RAY;  Surgeon: Felipa Furnace, DPM;  Location: [place];  Service: Podiatry;  Laterality: Left;   INCISION AND DRAINAGE ABSCESS Right 2007   groin; with ICU stay due to sepsis.   IR FLUORO GUIDE CV LINE RIGHT  03/06/2021   IR US GUIDE VASC ACCESS RIGHT  03/06/2021  LAPAROSCOPIC CHOLECYSTECTOMY  2011   LEFT HEART CATH AND CORONARY ANGIOGRAPHY N/A 10/05/2018   Procedure: LEFT HEART CATH AND CORONARY ANGIOGRAPHY;  Surgeon: Minna Merritts, MD;  Location: [place] [facility];  Service: Cardiovascular;  Laterality: N/A;   LEFT HEART CATHETERIZATION WITH CORONARY ANGIOGRAM N/A 07/21/2012   Procedure: LEFT HEART CATHETERIZATION WITH CORONARY ANGIOGRAM;  Surgeon: Jolaine Artist, MD;  Location: [facility] CATH LAB;  Service: Cardiovascular;  Laterality: N/A;   LUMBAR [place]   L4-5   PACEMAKER REMOVAL  11/2018   due to infection around pacemaker   [place] (PCI-S) N/A 07/23/2012   Procedure: PERCUTANEOUS CORONARY STENT INTERVENTION (PCI-S);  Surgeon: Sherren Mocha, MD;  Location: [facility] CATH LAB;  Service: Cardiovascular;  Laterality: N/A;   PERIPHERAL VASCULAR BALLOON ANGIOPLASTY Left 10/06/2018   Procedure: PERIPHERAL VASCULAR BALLOON ANGIOPLASTY;  Surgeon: Katha Cabal, MD;  Location: [place] [facility];  Service: Cardiovascular;  Laterality: Left;   PERIPHERAL VASCULAR CATHETERIZATION N/A 02/10/2015   Procedure: Picc Line  Insertion;  Surgeon: Katha Cabal, MD;  Location: [place] [facility];  Service: Cardiovascular;  Laterality: N/A;   RIGHT HEART CATH N/A 03/29/2019   Procedure: RIGHT HEART CATH;  Surgeon: Jolaine Artist, MD;  Location: [place] [facility];  Service: Cardiovascular;  Laterality: N/A;   TEE WITHOUT CARDIOVERSION N/A 10/02/2018   Procedure: TRANSESOPHAGEAL ECHOCARDIOGRAM (TEE);  Surgeon: [place] Hampshire, MD;  Location: [facility] ORS;  Service: Cardiovascular;  Laterality: N/A;   [place]  01/2011   Fibroids/DUB.  Ovaries removed. [place].    MEDICATIONS:  amiodarone (PACERONE) 200 MG tablet   aspirin EC 81 MG EC tablet   atorvastatin (LIPITOR) 10 MG tablet   calcitRIOL (ROCALTROL) 0.5 MCG capsule   Calcium Carb-Cholecalciferol (CALCIUM CARBONATE-VITAMIN D3) 600-400 MG-UNIT TABS   carvedilol (COREG) 3.125 MG tablet   clopidogrel (PLAVIX) 75 MG tablet   DULoxetine (CYMBALTA) 30 MG capsule   ferrous sulfate 325 (65 FE) MG tablet   gabapentin (NEURONTIN) 600 MG tablet   insulin aspart (NOVOLOG FLEXPEN) 100 UNIT/ML FlexPen   Insulin Pen Needle (PEN NEEDLES) 32G X 6 MM MISC   isosorbide mononitrate (IMDUR) 30 MG 24 hr tablet   LANTUS SOLOSTAR 100 UNIT/ML Solostar Pen   levothyroxine (SYNTHROID) 50 MCG tablet   Multiple Vitamin (MULTIVITAMIN WITH MINERALS) TABS tablet   nitroGLYCERIN (NITROSTAT) 0.4 MG SL tablet   Omega-3 Fatty Acids (FISH OIL) 1000 MG CAPS   ONETOUCH ULTRA test strip   traMADol (ULTRAM) 50 MG tablet   No current facility-administered medications for this encounter.    Konrad Felix, PA-C [facility] [phone]

## 2021-04-25 ENCOUNTER — Ambulatory Visit (HOSPITAL_COMMUNITY): Payer: HMO | Admitting: Anesthesiology

## 2021-04-25 ENCOUNTER — Ambulatory Visit (HOSPITAL_COMMUNITY): Payer: HMO

## 2021-04-25 ENCOUNTER — Encounter (HOSPITAL_COMMUNITY)
Admission: RE | Disposition: A | Discharge status: Home / Self Care | Payer: Self-pay | Source: Home / Self Care | Attending: Podiatry

## 2021-04-25 ENCOUNTER — Telehealth: Payer: Self-pay | Admitting: *Deleted

## 2021-04-25 ENCOUNTER — Ambulatory Visit (HOSPITAL_COMMUNITY)
Admission: RE | Admit: 2021-04-25 | Discharge: 2021-04-25 | Disposition: A | Discharge status: Home / Self Care | Payer: HMO | Attending: Podiatry | Admitting: Podiatry

## 2021-04-25 ENCOUNTER — Encounter (HOSPITAL_COMMUNITY): Payer: Self-pay | Admitting: Podiatry

## 2021-04-25 ENCOUNTER — Ambulatory Visit (HOSPITAL_COMMUNITY): Payer: HMO | Admitting: Physician Assistant

## 2021-04-25 ENCOUNTER — Encounter: Payer: Self-pay | Admitting: Podiatry

## 2021-04-25 DIAGNOSIS — M869 Osteomyelitis, unspecified: Secondary | ICD-10-CM | POA: Diagnosis not present

## 2021-04-25 DIAGNOSIS — G4733 Obstructive sleep apnea (adult) (pediatric): Secondary | ICD-10-CM | POA: Diagnosis not present

## 2021-04-25 DIAGNOSIS — L97529 Non-pressure chronic ulcer of other part of left foot with unspecified severity: Secondary | ICD-10-CM | POA: Diagnosis not present

## 2021-04-25 DIAGNOSIS — M7989 Other specified soft tissue disorders: Secondary | ICD-10-CM | POA: Diagnosis not present

## 2021-04-25 DIAGNOSIS — T8781 Dehiscence of amputation stump: Secondary | ICD-10-CM | POA: Insufficient documentation

## 2021-04-25 DIAGNOSIS — Y835 Amputation of limb(s) as the cause of abnormal reaction of the patient, or of later complication, without mention of misadventure at the time of the procedure: Secondary | ICD-10-CM | POA: Diagnosis not present

## 2021-04-25 DIAGNOSIS — Z9889 Other specified postprocedural states: Secondary | ICD-10-CM | POA: Diagnosis not present

## 2021-04-25 DIAGNOSIS — Z955 Presence of coronary angioplasty implant and graft: Secondary | ICD-10-CM | POA: Diagnosis not present

## 2021-04-25 DIAGNOSIS — M86672 Other chronic osteomyelitis, left ankle and foot: Secondary | ICD-10-CM | POA: Diagnosis not present

## 2021-04-25 DIAGNOSIS — E1169 Type 2 diabetes mellitus with other specified complication: Secondary | ICD-10-CM | POA: Diagnosis not present

## 2021-04-25 DIAGNOSIS — S91302A Unspecified open wound, left foot, initial encounter: Secondary | ICD-10-CM | POA: Diagnosis not present

## 2021-04-25 DIAGNOSIS — E11621 Type 2 diabetes mellitus with foot ulcer: Secondary | ICD-10-CM | POA: Diagnosis not present

## 2021-04-25 HISTORY — PX: IRRIGATION AND DEBRIDEMENT FOOT: SHX6602

## 2021-04-25 LAB — CBC
HCT: 38.1 % (ref 36.0–46.0)
Hemoglobin: 12.1 g/dL (ref 12.0–15.0)
MCH: 30 pg (ref 26.0–34.0)
MCHC: 31.8 g/dL (ref 30.0–36.0)
MCV: 94.5 fL (ref 80.0–100.0)
Platelets: 185 10*3/uL (ref 150–400)
RBC: 4.03 MIL/uL (ref 3.87–5.11)
RDW: 13.6 % (ref 11.5–15.5)
WBC: 7.5 10*3/uL (ref 4.0–10.5)
nRBC: 0 % (ref 0.0–0.2)

## 2021-04-25 LAB — BASIC METABOLIC PANEL
Anion gap: 13 (ref 5–15)
BUN: 41 mg/dL — ABNORMAL HIGH (ref 8–23)
CO2: 25 mmol/L (ref 22–32)
Calcium: 9.8 mg/dL (ref 8.9–10.3)
Chloride: 103 mmol/L (ref 98–111)
Creatinine, Ser: 1.68 mg/dL — ABNORMAL HIGH (ref 0.44–1.00)
GFR, Estimated: 33 mL/min — ABNORMAL LOW (ref >60)
Glucose, Bld: 159 mg/dL — ABNORMAL HIGH (ref 70–99)
Potassium: 4.9 mmol/L (ref 3.5–5.1)
Sodium: 141 mmol/L (ref 135–145)

## 2021-04-25 LAB — APTT: aPTT: 33 seconds (ref 24–36)

## 2021-04-25 LAB — GLUCOSE, CAPILLARY
Glucose-Capillary: 150 mg/dL — ABNORMAL HIGH (ref 70–99)
Glucose-Capillary: 151 mg/dL — ABNORMAL HIGH (ref 70–99)

## 2021-04-25 LAB — PROTIME-INR
INR: 1 (ref 0.8–1.2)
Prothrombin Time: 13.2 seconds (ref 11.4–15.2)

## 2021-04-25 SURGERY — IRRIGATION AND DEBRIDEMENT FOOT
Anesthesia: Monitor Anesthesia Care | Site: Foot | Laterality: Left

## 2021-04-25 MED ORDER — ACETAMINOPHEN 10 MG/ML IV SOLN: 1000.0000 mg | Freq: Once | INTRAVENOUS | Status: DC | PRN

## 2021-04-25 MED ORDER — CHLORHEXIDINE GLUCONATE CLOTH 2 % EX PADS: 6.0000 | MEDICATED_PAD | Freq: Once | CUTANEOUS | Status: DC

## 2021-04-25 MED ORDER — BUPIVACAINE HCL (PF) 0.5 % IJ SOLN
INTRAMUSCULAR | Status: AC
  Filled 2021-04-25: qty 30

## 2021-04-25 MED ORDER — ONDANSETRON HCL 4 MG/2ML IJ SOLN
INTRAMUSCULAR | Status: DC | PRN
  Administered 2021-04-25: 4 mg via INTRAVENOUS

## 2021-04-25 MED ORDER — ONDANSETRON HCL 4 MG/2ML IJ SOLN
INTRAMUSCULAR | Status: AC
  Filled 2021-04-25: qty 2

## 2021-04-25 MED ORDER — LACTATED RINGERS IV SOLN: INTRAVENOUS | Status: DC

## 2021-04-25 MED ORDER — FENTANYL CITRATE (PF) 100 MCG/2ML IJ SOLN
INTRAMUSCULAR | Status: DC | PRN
  Administered 2021-04-25: 50 ug via INTRAVENOUS

## 2021-04-25 MED ORDER — CHLORHEXIDINE GLUCONATE 0.12 % MT SOLN
15.0000 mL | Freq: Once | OROMUCOSAL | Status: AC
  Administered 2021-04-25: 15 mL via OROMUCOSAL

## 2021-04-25 MED ORDER — BUPIVACAINE HCL (PF) 0.5 % IJ SOLN
INTRAMUSCULAR | Status: DC | PRN
  Administered 2021-04-25: 5 mL

## 2021-04-25 MED ORDER — LIDOCAINE HCL (PF) 1 % IJ SOLN
INTRAMUSCULAR | Status: AC
  Filled 2021-04-25: qty 30

## 2021-04-25 MED ORDER — FENTANYL CITRATE (PF) 100 MCG/2ML IJ SOLN: 25.0000 ug | INTRAMUSCULAR | Status: DC | PRN

## 2021-04-25 MED ORDER — DOXYCYCLINE HYCLATE 100 MG PO CAPS: 100.0000 mg | ORAL_CAPSULE | Freq: Two times a day (BID) | ORAL | 0 refills | Status: DC

## 2021-04-25 MED ORDER — LIDOCAINE HCL 1 % IJ SOLN
INTRAMUSCULAR | Status: DC | PRN
  Administered 2021-04-25: 5 mL

## 2021-04-25 MED ORDER — HYDROCODONE-ACETAMINOPHEN 5-325 MG PO TABS: 1.0000 | ORAL_TABLET | Freq: Four times a day (QID) | ORAL | 0 refills | Status: DC | PRN

## 2021-04-25 MED ORDER — MIDAZOLAM HCL 2 MG/2ML IJ SOLN
INTRAMUSCULAR | Status: AC
  Filled 2021-04-25: qty 2

## 2021-04-25 MED ORDER — PROPOFOL 500 MG/50ML IV EMUL
INTRAVENOUS | Status: DC | PRN
  Administered 2021-04-25: 75 ug/kg/min via INTRAVENOUS

## 2021-04-25 MED ORDER — MIDAZOLAM HCL 5 MG/5ML IJ SOLN
INTRAMUSCULAR | Status: DC | PRN
  Administered 2021-04-25: 1 mg via INTRAVENOUS

## 2021-04-25 MED ORDER — FENTANYL CITRATE (PF) 100 MCG/2ML IJ SOLN
INTRAMUSCULAR | Status: AC
  Filled 2021-04-25: qty 2

## 2021-04-25 MED ORDER — ONDANSETRON HCL 4 MG/2ML IJ SOLN: 4.0000 mg | Freq: Once | INTRAMUSCULAR | Status: DC | PRN

## 2021-04-25 MED ORDER — ORAL CARE MOUTH RINSE: 15.0000 mL | Freq: Once | OROMUCOSAL | Status: AC

## 2021-04-25 MED ORDER — PROPOFOL 500 MG/50ML IV EMUL
INTRAVENOUS | Status: AC
  Filled 2021-04-25: qty 50

## 2021-04-25 MED ORDER — CEFAZOLIN IN SODIUM CHLORIDE 3-0.9 GM/100ML-% IV SOLN
3.0000 g | INTRAVENOUS | Status: AC
  Administered 2021-04-25: 3 g via INTRAVENOUS
  Filled 2021-04-25: qty 100

## 2021-04-25 SURGICAL SUPPLY — 60 items
BAG COUNTER SPONGE SURGICOUNT (BAG) IMPLANT
BAG SPNG CNTER NS LX DISP (BAG)
BLADE HEX COATED 2.75 (ELECTRODE) ×2 IMPLANT
BLADE OSCILLATING/SAGITTAL (BLADE) ×2
BLADE SURG 15 STRL LF DISP TIS (BLADE) ×2 IMPLANT
BLADE SURG 15 STRL SS (BLADE) ×4
BLADE SW THK.38XMED LNG THN (BLADE) ×1 IMPLANT
BNDG CMPR 9X4 STRL LF SNTH (GAUZE/BANDAGES/DRESSINGS) ×1
BNDG CMPR MED 10X6 ELC LF (GAUZE/BANDAGES/DRESSINGS) ×1
BNDG CONFORM 2 STRL LF (GAUZE/BANDAGES/DRESSINGS) ×2 IMPLANT
BNDG ELASTIC 3X5.8 VLCR STR LF (GAUZE/BANDAGES/DRESSINGS) ×2 IMPLANT
BNDG ELASTIC 6X10 VLCR STRL LF (GAUZE/BANDAGES/DRESSINGS) ×1 IMPLANT
BNDG ELASTIC 6X5.8 VLCR STR LF (GAUZE/BANDAGES/DRESSINGS) ×2 IMPLANT
BNDG ESMARK 4X9 LF (GAUZE/BANDAGES/DRESSINGS) ×2 IMPLANT
BNDG GAUZE ELAST 4 BULKY (GAUZE/BANDAGES/DRESSINGS) ×2 IMPLANT
BUR EGG ELITE 4.0 (BURR) ×2 IMPLANT
COVER BACK TABLE 60X90IN (DRAPES) ×2 IMPLANT
CUFF TOURN SGL QUICK 18X4 (TOURNIQUET CUFF) IMPLANT
DRAPE EXTREMITY T 121X128X90 (DISPOSABLE) ×2 IMPLANT
DRAPE IMP U-DRAPE 54X76 (DRAPES) ×2 IMPLANT
DRAPE OEC MINIVIEW 54X84 (DRAPES) ×2 IMPLANT
DRSG ADAPTIC 3X8 NADH LF (GAUZE/BANDAGES/DRESSINGS) ×1 IMPLANT
DRSG EMULSION OIL 3X3 NADH (GAUZE/BANDAGES/DRESSINGS) ×2 IMPLANT
DURAPREP 26ML APPLICATOR (WOUND CARE) IMPLANT
ELECT REM PT RETURN 15FT ADLT (MISCELLANEOUS) ×2 IMPLANT
GAUZE 4X4 16PLY ~~LOC~~+RFID DBL (SPONGE) IMPLANT
GAUZE SPONGE 4X4 12PLY STRL (GAUZE/BANDAGES/DRESSINGS) ×2 IMPLANT
GAUZE XEROFORM 1X8 LF (GAUZE/BANDAGES/DRESSINGS) ×1 IMPLANT
GLOVE SURG ENC MOIS LTX SZ7.5 (GLOVE) ×4 IMPLANT
GLOVE SURG LTX SZ7.5 (GLOVE) ×2 IMPLANT
GLOVE SURG UNDER POLY LF SZ7.5 (GLOVE) ×2 IMPLANT
GOWN STRL REUS W/ TWL LRG LVL3 (GOWN DISPOSABLE) ×1 IMPLANT
GOWN STRL REUS W/ TWL XL LVL3 (GOWN DISPOSABLE) ×1 IMPLANT
GOWN STRL REUS W/TWL LRG LVL3 (GOWN DISPOSABLE) ×2
GOWN STRL REUS W/TWL XL LVL3 (GOWN DISPOSABLE) ×2
KIT BASIN OR (CUSTOM PROCEDURE TRAY) ×2 IMPLANT
MICROMATRIX 500MG (Tissue) ×2 IMPLANT
NDL HYPO 25X1 1.5 SAFETY (NEEDLE) ×2 IMPLANT
NDL SAFETY ECLIPSE 18X1.5 (NEEDLE) IMPLANT
NEEDLE HYPO 18GX1.5 SHARP (NEEDLE)
NEEDLE HYPO 25X1 1.5 SAFETY (NEEDLE) ×4 IMPLANT
NS IRRIG 1000ML POUR BTL (IV SOLUTION) IMPLANT
PADDING CAST ABS 4INX4YD NS (CAST SUPPLIES) ×1
PADDING CAST ABS COTTON 4X4 ST (CAST SUPPLIES) ×1 IMPLANT
PENCIL SMOKE EVACUATOR (MISCELLANEOUS) ×2 IMPLANT
SOLUTION PARTIC MCRMTRX 500MG (Tissue) IMPLANT
STOCKINETTE 6  STRL (DRAPES) ×2
STOCKINETTE 6 STRL (DRAPES) ×1 IMPLANT
STRIP SUTURE WOUND CLOSURE 1/2 (MISCELLANEOUS) IMPLANT
SUT ETHILON 3 0 PS 1 (SUTURE) ×2 IMPLANT
SUT MNCRL AB 3-0 PS2 18 (SUTURE) ×2 IMPLANT
SUT MNCRL AB 4-0 PS2 18 (SUTURE) IMPLANT
SUT MON AB 5-0 PS2 18 (SUTURE) IMPLANT
SUT PROLENE 2 0 SH DA (SUTURE) ×2 IMPLANT
SUT PROLENE 3 0 PS 2 (SUTURE) ×2 IMPLANT
SYR 10ML LL (SYRINGE) IMPLANT
SYR BULB EAR ULCER 3OZ GRN STR (SYRINGE) ×2 IMPLANT
SYR CONTROL 10ML LL (SYRINGE) ×4 IMPLANT
TUBING CONNECTING 10 (TUBING) ×2 IMPLANT
UNDERPAD 30X36 HEAVY ABSORB (UNDERPADS AND DIAPERS) ×2 IMPLANT

## 2021-04-25 NOTE — Anesthesia Postprocedure Evaluation (Signed)
Anesthesia Post Note  Patient: [name]  Procedure(s) Performed: EXCISION AND DEBRIDEMENT WOUND ULCER LEFT FOOT; METATARSECTOMY (Left: Foot)     Patient location during evaluation: PACU Anesthesia Type: MAC Level of consciousness: awake and alert Pain management: pain level controlled Vital Signs Assessment: post-procedure vital signs reviewed and stable Respiratory status: spontaneous breathing, nonlabored ventilation, respiratory function stable and patient connected to nasal cannula oxygen Cardiovascular status: stable and blood pressure returned to baseline Postop Assessment: no apparent nausea or vomiting Anesthetic complications: no   No notable events documented.  Last Vitals:  Vitals:   04/25/21 1350 04/25/21 1400  BP: 138/64 135/72  Pulse: (!) 50 (!) 58  Resp: 10 10  Temp: 36.4 C   SpO2: 99% 96%    Last Pain:  Vitals:   04/25/21 1400  TempSrc:   PainSc: 0-No pain                 Bunny Lowdermilk S

## 2021-04-25 NOTE — Telephone Encounter (Signed)
Done today.  For documentation purposes, I had already signed (in the distant past) all received orders from last month and the months prior.  Thanks. 

## 2021-04-25 NOTE — Progress Notes (Signed)
I called the patient to see how she was doing after surgery. She is home and doing well. Denies any fevers, chills. No chest pain or shortness of breath. No bleeding through the bandages. No pain to the foot or leg. Her mom will check the color of the toes to make sure they are pink and warm. If any issues encouraged to call back. She has no concerns.

## 2021-04-25 NOTE — Transfer of Care (Signed)
Immediate Anesthesia Transfer of Care Note  Patient: [name]  Procedure(s) Performed: EXCISION AND DEBRIDEMENT WOUND ULCER LEFT FOOT; METATARSECTOMY (Left: Foot)  Patient Location: PACU  Anesthesia Type:MAC  Level of Consciousness: awake, alert , oriented and patient cooperative  Airway & Oxygen Therapy: Patient Spontanous Breathing and Patient connected to face mask oxygen  Post-op Assessment: Report given to RN and Post -op Vital signs reviewed and stable  Post vital signs: Reviewed and stable  Last Vitals:  Vitals Value Taken Time  BP 138/64 04/25/21 1349  Temp    Pulse 59 04/25/21 1351  Resp 12 04/25/21 1351  SpO2 100 % 04/25/21 1351  Vitals shown include unvalidated device data.  Last Pain:  Vitals:   04/25/21 1109  TempSrc:   PainSc: 0-No pain      Patients Stated Pain Goal: 3 (11/46/43 1427)  Complications: No notable events documented.

## 2021-04-25 NOTE — Brief Op Note (Signed)
04/25/2021  1:46 PM  PATIENT:  [name]  65 y.o. female  PRE-OPERATIVE DIAGNOSIS:  WOUND ULCER LEFT FOOT, OSTEOMYELITIS  POST-OPERATIVE DIAGNOSIS:  WOUND ULCER LEFT FOOT, OSTEOMYELITIS   PROCEDURE:  Procedure(s): EXCISION AND DEBRIDEMENT WOUND ULCER LEFT FOOT; METATARSECTOMY (Left)  SURGEON:  Surgeon(s) and Role:    * Trula Slade, DPM - Primary  PHYSICIAN ASSISTANT:   ASSISTANTS: none   ANESTHESIA:   MAC  EBL:  10 mL   BLOOD ADMINISTERED:none  DRAINS: none   LOCAL MEDICATIONS USED:  MARCAINE    and XYLOCAINE   SPECIMEN:  Source of Specimen:  METATARSAL FOR PATHOLOGY  DISPOSITION OF SPECIMEN:  PATHOLOGY  COUNTS:  YES  TOURNIQUET:   Total Tourniquet Time Documented: Calf (Left) - 26 minutes Total: Calf (Left) - 26 minutes   DICTATION: .Viviann Spare Dictation  PLAN OF CARE: Discharge to home after PACU  PATIENT DISPOSITION:  PACU - hemodynamically stable.   Delay start of Pharmacological VTE agent (>24hrs) due to surgical blood loss or risk of bleeding: no   Intraoperative findings: Metatarsal appeared soft. Due to concern for residual infection the metatarsal was resected. No purulence or signs of abscess. Wound debrided, irrigated, hemostasis achieved. 580m Acell micromatrix applied. Wound closed without tension. Discharge home with oral antibiotics for a week. toruniquet was used due to bleeding and use of Plavix.

## 2021-04-25 NOTE — Telephone Encounter (Signed)
Mariann Laster with [facility] called stating that they have several outstanding orders on the patient. Mariann Laster stated that the orders are Dr. Josefine Class orders. Mariann Laster stated that they have faxed the orders over to the office several times. Mariann Laster stated that the dates on the outstanding orders are 03/08/21, 03/29/21, 04/05/21 and 04/17/21. Mariann Laster stated the one for 03/08/21 is for resumption of care.

## 2021-04-25 NOTE — Progress Notes (Signed)
In phase 2 of PACU I talked to the patient again and noticed that there was blood on the bandage and it had soaked through onto the surgical shoe. She was sitting up with her foot hanging down. Due to this I changed the entire bandage as it was saturated through. There was no active bleeding up evaluation and the incision is in intact. There is an immediate capillary refill time to the toes and vascular status unchanged compared to pre-op. Reapplied dressing. Encouraged elevation. When she gets home to keep the foot elevated and she has assistive devices including a walker and wheelchair.

## 2021-04-25 NOTE — Anesthesia Procedure Notes (Signed)
Procedure Name: MAC Date/Time: 04/25/2021 12:59 PM Performed by: Lollie Sails, CRNA Pre-anesthesia Checklist: Patient identified, Emergency Drugs available, Suction available, Patient being monitored and Timeout performed Oxygen Delivery Method: Simple face mask Placement Confirmation: positive ETCO2

## 2021-04-25 NOTE — Anesthesia Preprocedure Evaluation (Signed)
Anesthesia Evaluation  Patient identified by MRN, date of birth, ID band Patient awake    Reviewed: Allergy & Precautions, NPO status , Patient's Chart, lab work & pertinent test results  Airway Mallampati: II  TM Distance: >3 FB Neck ROM: Full    Dental no notable dental hx.    Pulmonary sleep apnea ,    Pulmonary exam normal breath sounds clear to auscultation       Cardiovascular hypertension, + CAD, + Past MI, + Cardiac Stents and +CHF  Normal cardiovascular exam Rhythm:Regular Rate:Normal     Neuro/Psych negative neurological ROS  negative psych ROS   GI/Hepatic negative GI ROS, Neg liver ROS,   Endo/Other  diabetesHypothyroidism Morbid obesity  Renal/GU negative Renal ROS  negative genitourinary   Musculoskeletal negative musculoskeletal ROS (+)   Abdominal (+) + obese,   Peds negative pediatric ROS (+)  Hematology negative hematology ROS (+)   Anesthesia Other Findings   Reproductive/Obstetrics negative OB ROS                             Anesthesia Physical Anesthesia Plan  ASA: 3  Anesthesia Plan: MAC   Post-op Pain Management:    Induction: Intravenous  PONV Risk Score and Plan: 2 and Ondansetron, Dexamethasone, Treatment may vary due to age or medical condition and Propofol infusion  Airway Management Planned: Simple Face Mask  Additional Equipment:   Intra-op Plan:   Post-operative Plan:   Informed Consent: I have reviewed the patients History and Physical, chart, labs and discussed the procedure including the risks, benefits and alternatives for the proposed anesthesia with the patient or authorized representative who has indicated his/her understanding and acceptance.     Dental advisory given  Plan Discussed with: CRNA and Surgeon  Anesthesia Plan Comments:         Anesthesia Quick Evaluation

## 2021-04-25 NOTE — Telephone Encounter (Signed)
Called and spoke with Mariann Laster and advised that I faxed a bunch of orders for patient this afternoon. Dr. Damita Dunnings signed everything he had today. She still had not received anything and gave me another fax number to try: [phone]. I have now faxed the orders to this number as well and received confirmation on faxes from earlier and just now.

## 2021-04-25 NOTE — H&P (Signed)
Patient seen in pre-op at [facility] long. Scheduled for left foot ulcer excision, debridement, possible resection of 1st metatarsal and delayed closure of the ulcer. Again discussed the surgery and postop course. Again discussed risks, complications of the surgery. She has no further questions. Consent signed. NPO confirmed. Will proceed as scheduled

## 2021-04-25 NOTE — Discharge Instructions (Addendum)
Start doxycycline for one week Resume all home medications as before Elevate the foot and stay off of the foot as much as possible.  Check the color of the other toes to make sure they are pink in color.   When you are finished with your appointment at the hospital on Friday come by the office for a post-op check. I have you scheduled for 2:15 but if you cannot make it just come when you are done.   Please call me with any questions or concerns. [phone]

## 2021-04-26 ENCOUNTER — Encounter (HOSPITAL_COMMUNITY): Payer: Self-pay | Admitting: Podiatry

## 2021-04-27 ENCOUNTER — Other Ambulatory Visit: Payer: Self-pay

## 2021-04-27 ENCOUNTER — Ambulatory Visit (HOSPITAL_COMMUNITY)
Admission: RE | Admit: 2021-04-27 | Discharge: 2021-04-27 | Disposition: A | Discharge status: Home / Self Care | Payer: HMO | Source: Ambulatory Visit | Attending: Infectious Diseases | Admitting: Infectious Diseases

## 2021-04-27 ENCOUNTER — Ambulatory Visit (INDEPENDENT_AMBULATORY_CARE_PROVIDER_SITE_OTHER): Payer: HMO | Admitting: Podiatry

## 2021-04-27 VITALS — BP 154/79 | HR 65 | Temp 97.9°F

## 2021-04-27 DIAGNOSIS — S98112A Complete traumatic amputation of left great toe, initial encounter: Secondary | ICD-10-CM

## 2021-04-27 DIAGNOSIS — M8610 Other acute osteomyelitis, unspecified site: Secondary | ICD-10-CM

## 2021-04-27 DIAGNOSIS — E1149 Type 2 diabetes mellitus with other diabetic neurological complication: Secondary | ICD-10-CM

## 2021-04-27 DIAGNOSIS — Z452 Encounter for adjustment and management of vascular access device: Secondary | ICD-10-CM | POA: Insufficient documentation

## 2021-04-27 DIAGNOSIS — I878 Other specified disorders of veins: Secondary | ICD-10-CM | POA: Insufficient documentation

## 2021-04-27 DIAGNOSIS — Z9889 Other specified postprocedural states: Secondary | ICD-10-CM

## 2021-04-27 HISTORY — PX: IR REMOVAL TUN CV CATH W/O FL: IMG2289

## 2021-04-27 LAB — SURGICAL PATHOLOGY

## 2021-04-27 NOTE — Progress Notes (Signed)
Subjective: [name] is a 65 y.o. is seen today in office s/p left foot first metatarsal excision, wound debridement preformed on April 25, 2021.  Her pain is controlled currently.  She has tried to stay off the foot is much as possible.  She presents in a wheelchair.  She had her central line removed today.  Denies any systemic complaints such as fevers, chills, nausea, vomiting. No calf pain, chest pain, shortness of breath.   Objective: General: No acute distress, AAOx3  Neurovascular status unchanged compared to preop CRT less than 3 seconds to all remaining digits.  Foot is of normal temperature Left foot: Dressing intact with moderate strikethrough through the Kerlix but not through the Ace bandage.  Incision is well coapted without any evidence of dehiscence with sutures intact.  Mild surrounding erythema quite more from inflammation.  There is no ascending cellulitis, fluctuance, crepitus, malodor, drainage/purulence. There is mild edema around the surgical site. There is no pain along the surgical site.  On the left lateral leg on the area the previous wound hematoma this is almost healed.  A small superficial area of skin breakdown identified. No other areas of tenderness to bilateral lower extremities.  No other open lesions or pre-ulcerative lesions.  No pain with calf compression, swelling, warmth, erythema.   Assessment and Plan:  Status post left foot surgery, doing well with no complications   -Treatment options discussed including all alternatives, risks, and complications -Dressing was changed today.  There was no active bleeding identified.  Wound was clean.  Xeroform was applied followed by dry sterile dressing. -Encouraged elevation -Monitor for any clinical signs or symptoms of infection and DVT/PE and directed to call the office immediately should any occur or go to the ER. -Follow-up as scheduled or sooner if any problems arise. In the meantime, encouraged to call  the office with any questions, concerns, change in symptoms.   Celesta Gentile, DPM

## 2021-04-27 NOTE — Patient Instructions (Signed)
Home health orders: Was incision area with saline. Dry well. Apply xeroform to the incision and lateral leg wound followed by 4x4, kerlix, ACE.

## 2021-04-28 NOTE — Procedures (Signed)
PROCEDURE SUMMARY:  Successful removal of tunneled central catheter.  Patient tolerated well.  EBL < 5 mL  See full dictation in Imaging for details.  Armando Gang Areej Tayler PA-C 04/28/2021 9:41 AM

## 2021-04-28 NOTE — Progress Notes (Signed)
Subjective: [name] is a 65 y.o. 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  She is scheduled for surgery tomorrow for wound excision, possible removal of first metatarsal.  She is scheduled to get central line removed. Denies any systemic complaints such as fevers, chills, nausea, vomiting. No calf pain, chest pain, shortness of breath.   Objective: General: No acute distress, AAOx3  DP/PT pulses palpable, CRT < 3 sec to all remaining digits.  Left foot: Incision is well coapted with sutures intact however on the central aspect there is dehiscence.  The central aspect is open and there is approximately 1 cm probing as well as it does track slightly distal as well.  No probing to bone.  There is no surrounding erythema today.  There is no purulence.  There is no fluctuation crepitation.  No malodor. No other open lesions or pre-ulcerative lesions.  No pain with calf compression, swelling, warmth, erythema.   Assessment and Plan:  Status post left partial first amputation, central area of dehiscence  -Treatment options discussed including all alternatives, risks, and complications -At this point I discussed with her return to the operating tomorrow as scheduled for wound excision, debridement as well as possible move the first metatarsal.  Discussed Will likely need to do  this if the bone is soft or appears to be infected.  Unfortunately she is at high risk of amputation given reoccurrence of infections in her left foot.  She understands this but does not want proceed with proximal amputation at this time. -Again discussed the surgery as well as postoperative course.  She agrees and will proceed as scheduled.  Trula Slade DPM

## 2021-05-01 ENCOUNTER — Ambulatory Visit (INDEPENDENT_AMBULATORY_CARE_PROVIDER_SITE_OTHER): Payer: HMO | Admitting: Podiatry

## 2021-05-01 ENCOUNTER — Other Ambulatory Visit: Payer: Self-pay

## 2021-05-01 ENCOUNTER — Encounter: Payer: Self-pay | Admitting: Podiatry

## 2021-05-01 DIAGNOSIS — Z9889 Other specified postprocedural states: Secondary | ICD-10-CM

## 2021-05-01 DIAGNOSIS — E1149 Type 2 diabetes mellitus with other diabetic neurological complication: Secondary | ICD-10-CM

## 2021-05-01 DIAGNOSIS — S98112A Complete traumatic amputation of left great toe, initial encounter: Secondary | ICD-10-CM

## 2021-05-02 NOTE — Op Note (Signed)
PATIENT:  [name]  65 y.o. female   PRE-OPERATIVE DIAGNOSIS:  WOUND ULCER LEFT FOOT, OSTEOMYELITIS   POST-OPERATIVE DIAGNOSIS:  WOUND ULCER LEFT FOOT, OSTEOMYELITIS    PROCEDURE:  Procedure(s): EXCISION AND DEBRIDEMENT WOUND ULCER LEFT FOOT; METATARSECTOMY (Left)   SURGEON:  Surgeon(s) and Role:    * Trula Slade, DPM - Primary   PHYSICIAN ASSISTANT:   ASSISTANTS: none    ANESTHESIA:   MAC   EBL:  10 mL    BLOOD ADMINISTERED:none   DRAINS: none    LOCAL MEDICATIONS USED:  MARCAINE    and XYLOCAINE   SPECIMEN:  Source of Specimen:  METATARSAL FOR PATHOLOGY   DISPOSITION OF SPECIMEN:  PATHOLOGY   COUNTS:  YES   TOURNIQUET:   Total Tourniquet Time Documented: Calf (Left) - 26 minutes Total: Calf (Left) - 26 minutes     DICTATION: .Viviann Spare Dictation   PLAN OF CARE: Discharge to home after PACU   PATIENT DISPOSITION:  PACU - hemodynamically stable.   Delay start of Pharmacological VTE agent (>24hrs) due to surgical blood loss or risk of bleeding: no   Indications for surgery: 65 year old female with history of multiple debridements on her left foot.  She had a previous partial first amputation and the wound healed except for one area that remains open and drains.  There is no purulence.  During the postop course she would have intermittent erythema.  She is finished IV antibiotics per infectious disease.  Given the ongoing wound I recommended debridement, excision of the wound and possible resection of the remainder of the first metatarsal.  We discussed the surgery as well as postoperative course.  Mom is also present in the office for the preoperative discussion I can discuss with the patient the day of surgery.  Alternatives, risks, complications discussed.  No promises or guarantees given.  She understands that she is at risk of leg loss.  Procedure in detail: The patient was both verbally and visually identified by myself, nursing staff, the anesthesia  staff.  She was then transferred to the operating room via stretcher placed on operative table in supine position.  Well-padded calf tourniquet applied proximal to the area of the previous abrasion, hematoma on the leg.  After timeout was performed a mixture of lidocaine, Marcaine plain was infiltrated in a regional block fashion.  The left lower extremities and scrubbed, prepped, draped in normal sterile fashion.  Due to concern of bleeding a tourniquet was inflated.  At this time incision was made on the previous scar circumferentially also around the area of the draining wound.  The incision was made with a 15 by scalpel.  This was to the level of the first metatarsal.  The previous wound was excised as well.  At this time soft tissue was freed from the first metatarsal and metatarsal appeared to be soft.  Due to this elected to go and proceed with resection of the remaining metatarsal.  An osteotome was utilized to identify the first metatarsal cuneiform joint.  15 blade scalpel was utilized to release the soft tissues and the bone was then removed.  Metatarsal was sent to pathology.  The cuneiform appear to be viable.  Upon further debridement of the wound the second tarsal was not exposed.  There is no purulence or any signs of proximal infection.  At this time hemostasis was achieved.  The wound was then closed with nylon suture.  Xeroform was applied followed by dry sterile dressing.  Tourniquet was  released and there is found to be an immediate capillary fill time of the remaining digits.  She is awoken from anesthesia and found to have tolerated the procedure well and complications.  She was transferred to PACU vital signs stable and vascular status intact.

## 2021-05-03 ENCOUNTER — Encounter: Payer: HMO | Admitting: Podiatry

## 2021-05-03 ENCOUNTER — Other Ambulatory Visit: Payer: Self-pay | Admitting: Cardiovascular Disease

## 2021-05-03 NOTE — Telephone Encounter (Signed)
LMOV  

## 2021-05-03 NOTE — Telephone Encounter (Signed)
Please schedule overdue F/U appointment with Dr. Rockey Situ. Thank you!

## 2021-05-06 NOTE — Progress Notes (Signed)
Subjective: [name] is a 65 y.o. is seen today in office s/p left foot first metatarsal excision, wound debridement preformed on April 25, 2021.  She presents today for dressing change.  Denies any fevers or chills.  No nausea or vomiting.  She has no other concerns.   Objective: General: No acute distress, AAOx3  Neurovascular status unchanged compared to preop CRT less than 3 seconds to all remaining digits.  Foot is of normal temperature Left foot: Dressing intact with mild bloody strikethrough through the Kerlix but not through the Ace bandage.  Upon removal there is no active bleeding.  There is mild edema and faint erythema but there is no fluctuation, crepitation.  There is no increased skin warmth of the foot compared to contralateral extremity.  No purulence. No other open lesions or pre-ulcerative lesions.  No pain with calf compression, swelling, warmth, erythema.   Assessment and Plan:  Status post left foot surgery, doing well with no complications   -Treatment options discussed including all alternatives, risks, and complications -Dressing was changed today.  Wound was cleaned and Xeroform was applied followed by dry sterile dressing.  Discussed at this point they can do the dressing changes every other day.  Encouraged elevation in transcatheter is much as possible and continuing cam boot. -Monitor for any clinical signs or symptoms of infection and DVT/PE and directed to call the office immediately should any occur or go to the ER. -Follow-up as scheduled or sooner if any problems arise. In the meantime, encouraged to call the office with any questions, concerns, change in symptoms.   Celesta Gentile, DPM

## 2021-05-07 ENCOUNTER — Telehealth: Payer: Self-pay | Admitting: Podiatry

## 2021-05-07 DIAGNOSIS — I428 Other cardiomyopathies: Secondary | ICD-10-CM | POA: Diagnosis not present

## 2021-05-07 DIAGNOSIS — I130 Hypertensive heart and chronic kidney disease with heart failure and stage 1 through stage 4 chronic kidney disease, or unspecified chronic kidney disease: Secondary | ICD-10-CM | POA: Diagnosis not present

## 2021-05-07 DIAGNOSIS — Z89422 Acquired absence of other left toe(s): Secondary | ICD-10-CM | POA: Diagnosis not present

## 2021-05-07 DIAGNOSIS — I4891 Unspecified atrial fibrillation: Secondary | ICD-10-CM | POA: Diagnosis not present

## 2021-05-07 DIAGNOSIS — I252 Old myocardial infarction: Secondary | ICD-10-CM | POA: Diagnosis not present

## 2021-05-07 DIAGNOSIS — Z4781 Encounter for orthopedic aftercare following surgical amputation: Secondary | ICD-10-CM | POA: Diagnosis not present

## 2021-05-07 DIAGNOSIS — I447 Left bundle-branch block, unspecified: Secondary | ICD-10-CM | POA: Diagnosis not present

## 2021-05-07 DIAGNOSIS — E785 Hyperlipidemia, unspecified: Secondary | ICD-10-CM | POA: Diagnosis not present

## 2021-05-07 DIAGNOSIS — E039 Hypothyroidism, unspecified: Secondary | ICD-10-CM | POA: Diagnosis not present

## 2021-05-07 DIAGNOSIS — I255 Ischemic cardiomyopathy: Secondary | ICD-10-CM | POA: Diagnosis not present

## 2021-05-07 DIAGNOSIS — M869 Osteomyelitis, unspecified: Secondary | ICD-10-CM | POA: Diagnosis not present

## 2021-05-07 DIAGNOSIS — E1142 Type 2 diabetes mellitus with diabetic polyneuropathy: Secondary | ICD-10-CM | POA: Diagnosis not present

## 2021-05-07 DIAGNOSIS — E1151 Type 2 diabetes mellitus with diabetic peripheral angiopathy without gangrene: Secondary | ICD-10-CM | POA: Diagnosis not present

## 2021-05-07 DIAGNOSIS — D631 Anemia in chronic kidney disease: Secondary | ICD-10-CM | POA: Diagnosis not present

## 2021-05-07 DIAGNOSIS — J189 Pneumonia, unspecified organism: Secondary | ICD-10-CM | POA: Diagnosis not present

## 2021-05-07 DIAGNOSIS — I5030 Unspecified diastolic (congestive) heart failure: Secondary | ICD-10-CM | POA: Diagnosis not present

## 2021-05-07 DIAGNOSIS — I2510 Atherosclerotic heart disease of native coronary artery without angina pectoris: Secondary | ICD-10-CM | POA: Diagnosis not present

## 2021-05-07 DIAGNOSIS — I70202 Unspecified atherosclerosis of native arteries of extremities, left leg: Secondary | ICD-10-CM | POA: Diagnosis not present

## 2021-05-07 DIAGNOSIS — I5023 Acute on chronic systolic (congestive) heart failure: Secondary | ICD-10-CM | POA: Diagnosis not present

## 2021-05-07 DIAGNOSIS — S81802D Unspecified open wound, left lower leg, subsequent encounter: Secondary | ICD-10-CM | POA: Diagnosis not present

## 2021-05-07 DIAGNOSIS — N184 Chronic kidney disease, stage 4 (severe): Secondary | ICD-10-CM | POA: Diagnosis not present

## 2021-05-07 DIAGNOSIS — F32A Depression, unspecified: Secondary | ICD-10-CM | POA: Diagnosis not present

## 2021-05-07 DIAGNOSIS — G4733 Obstructive sleep apnea (adult) (pediatric): Secondary | ICD-10-CM | POA: Diagnosis not present

## 2021-05-07 DIAGNOSIS — E1122 Type 2 diabetes mellitus with diabetic chronic kidney disease: Secondary | ICD-10-CM | POA: Diagnosis not present

## 2021-05-07 DIAGNOSIS — E1169 Type 2 diabetes mellitus with other specified complication: Secondary | ICD-10-CM | POA: Diagnosis not present

## 2021-05-07 NOTE — Telephone Encounter (Signed)
[facility] called and wanted to know if [name] wanted to do the same wound care instructions from, before and how often  [phone] [facility]

## 2021-05-07 NOTE — Telephone Encounter (Signed)
Mary from [facility] called requesting you or a nurse to giver her a call to clarify wound care orders. Please advise.

## 2021-05-07 NOTE — Telephone Encounter (Signed)
Dr Jacqualyn Posey called and spoke [name] [facility]). [name]

## 2021-05-09 DIAGNOSIS — J189 Pneumonia, unspecified organism: Secondary | ICD-10-CM | POA: Diagnosis not present

## 2021-05-09 DIAGNOSIS — M86171 Other acute osteomyelitis, right ankle and foot: Secondary | ICD-10-CM | POA: Diagnosis not present

## 2021-05-09 DIAGNOSIS — I5022 Chronic systolic (congestive) heart failure: Secondary | ICD-10-CM | POA: Diagnosis not present

## 2021-05-09 DIAGNOSIS — G4733 Obstructive sleep apnea (adult) (pediatric): Secondary | ICD-10-CM | POA: Diagnosis not present

## 2021-05-10 ENCOUNTER — Ambulatory Visit (INDEPENDENT_AMBULATORY_CARE_PROVIDER_SITE_OTHER): Payer: HMO | Admitting: Podiatry

## 2021-05-10 ENCOUNTER — Other Ambulatory Visit: Payer: Self-pay

## 2021-05-10 ENCOUNTER — Ambulatory Visit: Payer: HMO

## 2021-05-10 DIAGNOSIS — L97522 Non-pressure chronic ulcer of other part of left foot with fat layer exposed: Secondary | ICD-10-CM

## 2021-05-10 DIAGNOSIS — E11621 Type 2 diabetes mellitus with foot ulcer: Secondary | ICD-10-CM | POA: Diagnosis not present

## 2021-05-10 DIAGNOSIS — E0843 Diabetes mellitus due to underlying condition with diabetic autonomic (poly)neuropathy: Secondary | ICD-10-CM

## 2021-05-14 ENCOUNTER — Telehealth: Payer: Self-pay | Admitting: Family Medicine

## 2021-05-14 NOTE — Chronic Care Management (AMB) (Signed)
  Chronic Care Management   Note  05/14/2021 Name: [name] MRN: [id number] DOB: [birth year]  [name] is a 65 y.o. year old female who is a primary care patient of [name], MD. I reached out to [name] by phone today in response to a referral sent by [name]'s PCP, [name], MD.   [name] was given information about Chronic Care Management services today including:  CCM service includes personalized support from designated clinical staff supervised by her physician, including individualized plan of care and coordination with other care providers 24/7 contact phone numbers for assistance for urgent and routine care needs. Service will only be billed when office clinical staff spend 20 minutes or more in a month to coordinate care. Only one practitioner may furnish and bill the service in a calendar month. The patient may stop CCM services at any time (effective at the end of the month) by phone call to the office staff.   Patient agreed to services and verbal consent obtained.   Follow up plan:   Tatjana Secretary/administrator

## 2021-05-15 ENCOUNTER — Telehealth: Payer: Self-pay | Admitting: Podiatry

## 2021-05-15 ENCOUNTER — Telehealth: Payer: Self-pay | Admitting: *Deleted

## 2021-05-15 NOTE — Telephone Encounter (Signed)
[name] calling to get clarification on wound orders.Patient stated she was given hydrofera blue in the office, but [name] stated she spoke with [name] in regards to wound care and this apparently isnt what was needed. Please advise.

## 2021-05-15 NOTE — Telephone Encounter (Signed)
Tried to call [name], RN back but no answer and VM was full. Also called patient but line went dead. Will try again tomorrow.

## 2021-05-15 NOTE — Telephone Encounter (Signed)
Mary RN with [facility] called stating that she was doing a home visit with patient today and her blood pressure was elevated at 172/76 asymptomatic, no headache. [name] stated that patient's blood sugar was 273 and asymptomatic. Mary requested a call back with any instructions.

## 2021-05-15 NOTE — Progress Notes (Signed)
Subjective: [name] is a 65 y.o. is seen today in office s/p left foot first metatarsal excision, wound debridement preformed on April 25, 2021.  She presents today for dressing change.  She is interested off the foot is much as possible.  Denies any fevers or chills.  No nausea or vomiting.  She has no other concerns.   Objective: General: No acute distress, AAOx3  Neurovascular status unchanged compared to preop CRT less than 3 seconds to all remaining digits.  Foot is of normal temperature Left foot: Dressing intact with mild bloody strikethrough through the Kerlix but not through the Ace bandage.  Upon removal there is no active bleeding.  There is also mild edema but no cellulitis.  No fluctuance or crepitation but there is no drainage or pus identified today.  The central aspect of the incision is coapted but does appear to be taking longer to heal compared to the other portion of the incision.   No other open lesions or pre-ulcerative lesions.  No pain with calf compression, swelling, warmth, erythema.   Assessment and Plan:  Status post left foot surgery, doing well with no complications   -Treatment options discussed including all alternatives, risks, and complications -Does appear that the central portion is still closed but seems to be taking longer to heal.  We will place referral to the wound care center because I am fearful of the wound opening.  We will switch to using Hydrofera Blue dressing changes on the incision daily which I ordered through [facility].  -Continue nonweightbearing -Encouraged elevation -Monitor for any clinical signs or symptoms of infection and directed to call the office immediately should any occur or go to the ER.  Return in about 1 week (around 05/17/2021).  Trula Slade DPM

## 2021-05-15 NOTE — Telephone Encounter (Signed)
Please get a few more sugar and BP readings and let me know about both.  Thanks.

## 2021-05-16 NOTE — Telephone Encounter (Signed)
LMTCB

## 2021-05-17 ENCOUNTER — Encounter: Payer: HMO | Admitting: Podiatry

## 2021-05-17 ENCOUNTER — Other Ambulatory Visit: Payer: Self-pay

## 2021-05-17 ENCOUNTER — Ambulatory Visit (INDEPENDENT_AMBULATORY_CARE_PROVIDER_SITE_OTHER): Payer: HMO | Admitting: Podiatry

## 2021-05-17 DIAGNOSIS — E0843 Diabetes mellitus due to underlying condition with diabetic autonomic (poly)neuropathy: Secondary | ICD-10-CM

## 2021-05-17 DIAGNOSIS — L97522 Non-pressure chronic ulcer of other part of left foot with fat layer exposed: Secondary | ICD-10-CM

## 2021-05-17 NOTE — Telephone Encounter (Signed)
Called and spoke with [name] this morning and relayed the message per Dr Jacqualyn Posey. [name]

## 2021-05-17 NOTE — Telephone Encounter (Signed)
Tried to call the nurse [facility]) back from [place] and the voice mail is full and can not accept any new messages. [name]

## 2021-05-17 NOTE — Patient Instructions (Addendum)
[place] wound with saline. Apply hydrofera blue to the incision and then cover with 4x4, kerlix, ACE. Change bandage 2 times a week.

## 2021-05-18 NOTE — Telephone Encounter (Signed)
Please get a few more sugar and BP readings and let me know about both.  Thanks.

## 2021-05-18 NOTE — Telephone Encounter (Signed)
LVM for patient to call the office for BP and sugar reading.  Davina Howlett,cma

## 2021-05-18 NOTE — Progress Notes (Signed)
[facility] Progress Note     Primary Physician [name], MD Primary Cardiologist: Ida Rogue, MD  Electrophysiologist: Virl Axe, MD   Advanced HF: Dr. Haroldine Laws  Sleep Clinic: Dr. Radford Pax   Reason for Visit/CC: f/u for chronic systolic HF  HPI:  [name] is a 65 y.o. 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   Admitted on 6/20 with A/C biventricular HF. EF improved previously with CRT but back down again 10-15% (6/20) after CRT removal at [facility] due to sepsis with TV endocarditis. Placed on milrinone which was later weaned off. Unfortunately while hospitalized her husband had CVA and passed away on 04-21-2023. On 04-21-19 she underwent CRT-D reimplantation and was discharged home.   She has made a steady recovery with CRT. Echo 6/21 EF 50-55%   Saw Dr. Caryl Comes 12/21   She was seen in the ED 10/02/20. Felt to have URI. No COVID.   Admitted 2/22 for cellulitis, with MRI findings showing osteomyelitis of the left foot. Underwent I&D and debridement with podiatry. Incidentally found to be COVID+, asymptomatic. She was discharged on reduced torsemide dose (20 mg bid) and losartan was stopped this admission due to worsening renal function.  Admitted 4/22 for osteomyelitis of left foot after failing outpatient management. Had initial surgery (I&D, bone biopsy, and sesamoidectomy). Vascular was consulted after it was felt she was high risk for limb loss due to dampened monophasic signals in left foot on ABI. She underwent CO2 angiogram with angioplasty of 3 separate in-stent stenoses in the left superficial femoral artery, and then revision of surgery (bone resection of 5th metatarsal head and base of the prox phalanx of the 5th). Torsemide held at discharge due to worsening renal  function.  Admitted 5/22 for cellulitis. She underwent amputation of left great toe 5/27. Repeat arterial studies 5/27 showed a 50-59% stenosis on the distal left popliteal stent segment, no new interventions per vascular. ID was consulted and recommended 6 weeks of abx. Tunneled central line placed (PICC avoided due to CKD) and she was discharged on Daptomycin.   Today she returns for HF follow up. Last seen 1/22 and was doing well with NYHA III symptoms, volume was good. Since then, she has had several hospitalizations for non-healing foot ulcer, ultimately requiring amputation and IV abx for osteomyelitis (see above). Struggling to get around with her left foot in an ortho shoe and requiring a walker for balance. Has not been very active but no dyspnea with ADLs. Denies CP, dizziness, edema, or PND/Orthopnea. Appetite ok. No fever or chills. She does not weigh at home. She is not on torsemide or Jardiance.   Cardiac Studies  - Echo (6/20): EF 10-15%, RV severely reduced, moderately enlarged, RVSP 45 mmHG, moderate RAE, moderate MR, mod/severe TR.  - RHC (6/20):    Most Recent Value  Fick Cardiac Output 5.37 L/min  Fick Cardiac Output Index 2.24 (L/min)/BSA  RA A Wave 16 mmHg  RA V Wave 18 mmHg  RA Mean 16 mmHg  RV Systolic Pressure 57 mmHg  RV Diastolic Pressure 8 mmHg  RV EDP 20 mmHg  PA Systolic Pressure 57 mmHg  PA Diastolic Pressure 28 mmHg  PA Mean 40 mmHg  PW A Wave 27 mmHg  PW V Wave 36 mmHg  PW Mean 27 mmHg  QP/QS 1  TPVR Index 17.85 HRUI   Current Meds  Medication Sig   amiodarone (PACERONE) 200 MG tablet TAKE 1 TABLET BY MOUTH ONCE DAILY   aspirin EC 81 MG EC tablet Take 1 tablet (81 mg total) by mouth daily. Swallow whole.   atorvastatin (LIPITOR) 10 MG tablet Take 1 tablet (10 mg total) by mouth daily.   calcitRIOL (ROCALTROL) 0.5 MCG capsule Take 0.5 mcg by mouth every morning.   Calcium Carb-Cholecalciferol (CALCIUM CARBONATE-VITAMIN D3) 600-400 MG-UNIT TABS Take 1  tablet by mouth 2 (two) times a day.   carvedilol (COREG) 3.125 MG tablet Take 3.125 mg by mouth every morning.   clopidogrel (PLAVIX) 75 MG tablet TAKE 1 TABLET BY MOUTH ONCE DAILY   DULoxetine (CYMBALTA) 30 MG capsule Take 1 capsule (30 mg total) by mouth at bedtime.   ferrous sulfate 325 (65 FE) MG tablet Take 325 mg by mouth 2 (two) times daily with a meal.   gabapentin (NEURONTIN) 600 MG tablet Take 1 tablet (600 mg total) by mouth at bedtime.   HYDROcodone-acetaminophen (NORCO/VICODIN) 5-325 MG tablet Take 1-2 tablets by mouth every 6 (six) hours as needed.   insulin aspart (NOVOLOG FLEXPEN) 100 UNIT/ML FlexPen Inject 5-10 Units into the skin 3 (three) times daily before meals. Sugar 100-149 - 5 units.  150-199- 6 units.  200-249- 7 units.  250-299- 8 units. 300-349- 9 units.  350+ 10 units.   Insulin Pen Needle (PEN NEEDLES) 32G X 6 MM MISC 1 each by Does not apply route 4 (four) times daily.   isosorbide mononitrate (IMDUR) 30 MG 24 hr tablet Take 1 tablet (30 mg total) by mouth daily. Needs appt   LANTUS SOLOSTAR 100 UNIT/ML Solostar Pen INJECT 22 UNITS UNDER THE SKIN DAILY. (Patient taking differently: Inject 22 Units into the skin at bedtime.)   levothyroxine (SYNTHROID) 50 MCG tablet Take 1 tablet (50 mcg total) by mouth daily before breakfast.   losartan (COZAAR) 25 MG tablet Take 12.5 mg by mouth daily.   Multiple Vitamin (MULTIVITAMIN WITH MINERALS) TABS tablet Take 1 tablet by mouth every morning.   nitroGLYCERIN (NITROSTAT) 0.4 MG SL tablet Place 1 tablet (0.4 mg total) under the tongue every 5 (five) minutes as needed for chest pain.   Omega-3 Fatty Acids (FISH OIL) 1000 MG CAPS Take 1,000 mg by mouth 2 (two) times a day.   ONETOUCH ULTRA test strip UUD TO CHECK BLOOD SUGAR 3 TIMES DAILY   torsemide (DEMADEX) 20 MG tablet Take 1 tablet (20 mg total) by mouth daily.   traMADol (ULTRAM) 50 MG tablet Take 50 mg by mouth 2 (two) times daily as needed.   Allergies  Allergen  Reactions   Heparin     Can not remember reaction   Nsaids     Kidney disease   Past Medical History:  Diagnosis Date   Acute myocardial infarction, subendocardial infarction, initial episode of care ([employer]) 07/21/2012   Acute osteomyelitis involving ankle and foot ([place]) 16/96/7893   Acute systolic heart failure ([place]) 07/21/2012   New onset 07/19/12; admission to [facility] ED. Elevated Troponins.  S/p 2D-echo with EF 20-25%.  S/p cardiac catheterization with stenting LAD.  Repeat 2D-echo 10/2011 with improved EF of 35%.    Anemia    Automatic implantable cardioverter-defibrillator in situ    a. MDT CRT-D 06/2014, SN: [id number]  .removed in feb 2020   CAD (coronary artery disease)    a. cardiac cath 101/04/2012: PCI/DES to chronically occluded mLAD, consideration  PCI to diag branch in 4 weeks.    Cataract    Chicken pox    Chronic kidney disease    Chronic systolic CHF (congestive heart failure) (HCC)    a. mixed ICM & NICM; b. EF 20-25% by echo 07/2012, mid-dist 2/3 of LV sev HK/AK, mild MR. echo 10/2012: EF 30-35%, sev HK ant-septal & inf walls, GR1DD, mild MR, PASP 33. c. echo 02/2013: EF 30%, GR1DD, mild MR. echo 04/2014: EF 30%, Septal-lat dyssynchrony, global HK, inf AK, GR1DD, mild MR. d. echo 10/2014: EF50-55%, WM nl, GR1DD, septal mild paradox. e. echo 02/2015: EF 50-55%, wm    Depression    Heart attack ([place])    Heel ulcer ([place]) 04/27/2015   History of blood transfusion ~ 2011   "plasma; had neuropathy; couldn't walk"   Hypertension    Hypothyroidism    LBBB (left bundle branch block)    Neuromuscular disorder ([place])    Neuropathy 2011   Obesity, unspecified    OSA on CPAP    Moderate with AHI 23/hr and now on CPAP at 16cm H2O.  Her DME is [facility]   Pure hypercholesterolemia    Sepsis ([place]) 09/2018   Type II diabetes mellitus ([place])    Unspecified vitamin D deficiency    Family History  Problem Relation Age of Onset   Diabetes Mother    Hypertension Mother    Arthritis Mother         knees, lumbar DDD, cervical DDD   Cancer Father        prostate,skin,lymphoma.   Cancer Brother 31       bladder cancer; non-smoker   Diabetes Maternal Grandmother    Heart disease Maternal Grandmother    COPD Maternal Grandmother    Diabetes Paternal Grandmother    Obesity Brother    Diabetes Son    Hypertension Son    Cancer Maternal Grandfather    Diabetes Paternal Grandfather    Past Surgical History:  Procedure Laterality Date   ABDOMINAL AORTOGRAM W/LOWER EXTREMITY N/A 02/02/2021   Procedure: ABDOMINAL AORTOGRAM W/LOWER EXTREMITY;  Surgeon: Angelia Mould, MD;  Location: [place] CV LAB;  Service: Cardiovascular;  Laterality: N/A;   AMPUTATION Left 03/02/2021   Procedure: AMPUTATION RAY, left great toe;  Surgeon: Trula Slade, DPM;  Location: [place];  Service: Podiatry;  Laterality: Left;   AMPUTATION TOE Right 06/18/2015   Procedure: AMPUTATION TOE;  Surgeon: Samara Deist, DPM;  Location: [facility] ORS;  Service: Podiatry;  Laterality: Right;   AMPUTATION TOE Left 10/08/2018   Procedure: AMPUTATION TOE LEFT 2ND;  Surgeon: Albertine Patricia, DPM;  Location: [facility] OR[facility];  Service: Podiatry;  Laterality: Left;   BACK SURGERY     BI-VENTRICULAR IMPLANTABLE CARDIOVERTER DEFIBRILLATOR N/A 07/06/2014   Procedure: BI-VENTRICULAR IMPLANTABLE CARDIOVERTER DEFIBRILLATOR  (CRT-D);  Surgeon: Deboraha Sprang, MD;  Location: [facility] CATH LAB;  Service: Cardiovascular;  Laterality: N/A;   BI-VENTRICULAR IMPLANTABLE CARDIOVERTER DEFIBRILLATOR  (CRT-D)  07/06/2014   BILATERAL OOPHORECTOMY  01/2011   ovarian cyst benign   BIV ICD INSERTION CRT-D N/A 03/31/2019   Procedure: BIV ICD INSERTION CRT-D;  Surgeon: Constance Haw, MD;  Location: [place] CV LAB;  Service: Cardiovascular;  Laterality: N/A;   COLONOSCOPY WITH PROPOFOL Left 02/22/2015   Procedure: COLONOSCOPY WITH PROPOFOL;  Surgeon: Hulen Luster, MD;  Location: [facility] ENDOSCOPY;  Service: Endoscopy;  Laterality: Left;   CORONARY  ANGIOPLASTY WITH STENT PLACEMENT Left 07/2012   new onset systolic CHF; elevated troponins.  Cardiac catheterization with stenting to LAD; EF 15%.  2D-echo: EF 20-25%.   ESOPHAGOGASTRODUODENOSCOPY N/A 02/22/2015   Procedure: ESOPHAGOGASTRODUODENOSCOPY (EGD);  Surgeon: Hulen Luster, MD;  Location: [facility] ENDOSCOPY;  Service: Endoscopy;  Laterality: N/A;   I & D EXTREMITY Left 11/28/2020   Procedure: IRRIGATION AND DEBRIDEMENT OF FOOT;  Surgeon: Trula Slade, DPM;  Location: [place];  Service: Podiatry;  Laterality: Left;   I & D EXTREMITY Left 01/31/2021   Procedure: IRRIGATION AND DEBRIDEMENT EXTREMITY OF LEFT FOOT WITH BONE BIOPSY OF  1ST METATARSAL AND FIFTH METATARSAL;  Surgeon: Felipa Furnace, DPM;  Location: [place];  Service: Podiatry;  Laterality: Left;   I & D EXTREMITY Left 02/03/2021   Procedure: IRRIGATION AND DEBRIDEMENT EXTREMITY WITH INTERNAL AMPUTATION FIRST AND FIFTH RAY;  Surgeon: Felipa Furnace, DPM;  Location: [place];  Service: Podiatry;  Laterality: Left;   INCISION AND DRAINAGE ABSCESS Right 2007   groin; with ICU stay due to sepsis.   IR FLUORO GUIDE CV LINE RIGHT  03/06/2021   IR REMOVAL TUN CV CATH W/O FL  04/27/2021   IR US GUIDE VASC ACCESS RIGHT  03/06/2021   IRRIGATION AND DEBRIDEMENT FOOT Left 04/25/2021   Procedure: EXCISION AND DEBRIDEMENT WOUND ULCER LEFT FOOT; METATARSECTOMY;  Surgeon: Trula Slade, DPM;  Location: [facility];  Service: Podiatry;  Laterality: Left;   LAPAROSCOPIC CHOLECYSTECTOMY  2011   LEFT HEART CATH AND CORONARY ANGIOGRAPHY N/A 10/05/2018   Procedure: LEFT HEART CATH AND CORONARY ANGIOGRAPHY;  Surgeon: Minna Merritts, MD;  Location: [place] CV LAB;  Service: Cardiovascular;  Laterality: N/A;   LEFT HEART CATHETERIZATION WITH CORONARY ANGIOGRAM N/A 07/21/2012   Procedure: LEFT HEART CATHETERIZATION WITH CORONARY ANGIOGRAM;  Surgeon: Jolaine Artist, MD;  Location: [facility] C[facility] LAB;  Service: Cardiovascular;  Laterality: N/A;   LUMBAR [place]   L4-5   PACEMAKER REMOVAL  11/2018   due to infection around pacemaker   [place] (PCI-S) N/A 07/23/2012   Procedure: PERCUTANEOUS CORONARY STENT INTERVENTION (PCI-S);  Surgeon: Sherren Mocha, MD;  Location: [facility] CATH LAB;  Service: Cardiovascular;  Laterality: N/A;   PERIPHERAL VASCULAR BALLOON ANGIOPLASTY Left 10/06/2018   Procedure: PERIPHERAL VASCULAR BALLOON ANGIOPLASTY;  Surgeon: Katha Cabal, MD;  Location: [place] CV LAB;  Service: Cardiovascular;  Laterality: Left;   PERIPHERAL VASCULAR CATHETERIZATION N/A 02/10/2015   Procedure: Picc Line Insertion;  Surgeon: Katha Cabal, MD;  Location: [place] CV LAB;  Service: Cardiovascular;  Laterality: N/A;   RIGHT HEART CATH N/A 03/29/2019   Procedure: RIGHT HEART CATH;  Surgeon: Jolaine Artist, MD;  Location: [place] CV LAB;  Service: Cardiovascular;  Laterality: N/A;   TEE WITHOUT CARDIOVERSION N/A 10/02/2018   Procedure: TRANSESOPHAGEAL ECHOCARDIOGRAM (TEE);  Surgeon: Wellington Hampshire, MD;  Location: [facility] OR[facility];  Service: Cardiovascular;  Laterality: N/A;   [place]  01/2011   Fibroids/DUB.  Ovaries removed. [place].   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  Tobacco Use   Smoking status: Never   Smokeless tobacco: Never  Vaping  Use   Vaping Use: Never used  Substance and Sexual Activity   Alcohol use: No    Alcohol/week: 0.0 standard drinks   Drug use: No   Sexual activity: Not Currently    Birth control/protection: Post-menopausal, Surgical  Other Topics Concern   Not on file  Social History Narrative   Widowed,  husband died when she was in surgery.        Children:  2 children (daughter, son). Two grandsons and 2 step grandchildren.      Lives: with husband, daughter, son-in-law, 2 grandsons, and 1 on the way.      Employment: disability for peripheral neuropathy 2012.  Previously had home daycare.   Social Determinants of Health   Financial Resource Strain: Low Risk    Difficulty of Paying Living Expenses: Not hard at all  Food Insecurity: No Food Insecurity   Worried About Charity fundraiser in the Last Year: Never true   [place] in the Last Year: Never true  Transportation Needs: No Transportation Needs   Lack of Transportation (Medical): No   Lack of Transportation (Non-Medical): No  Physical Activity: Inactive   Days of Exercise per Week: 0 days   Minutes of Exercise per Session: 0 min  Stress: No Stress Concern Present   Feeling of Stress : Not at all  Social Connections: Not on file  Intimate Partner Violence: Not At Risk   Fear of Current or Ex-Partner: No   Emotionally Abused: No   Physically Abused: No   Sexually Abused: No    Lipid Panel     Component Value Date/Time   CHOL 128 09/26/2020 1216   TRIG 134 09/26/2020 1216   HDL 44 09/26/2020 1216   CHOLHDL 2.9 09/26/2020 1216   CHOLHDL 5.6 (H) 08/06/2016 1431   VLDL 34 (H) 08/06/2016 1431   LDLCALC 60 09/26/2020 1216   Wt Readings from Last 3 Encounters:  05/21/21 125.9 kg  04/23/21 122.7 kg  04/20/21 124.7 kg   BP 139/77   Pulse 67   Wt 125.9 kg   SpO2 98%   BMI 42.21 kg/m   Physical Exam General:  NAD. No resp difficulty HEENT: Normal Neck: Supple. No JVD. Carotids 2+ bilat; no bruits. No lymphadenopathy or thryomegaly appreciated. Cor: PMI nondisplaced. Regular rate & rhythm. No rubs, gallops or murmurs. Lungs: Clear Abdomen: Obese, nontender, nondistended. No hepatosplenomegaly. No bruits or masses. Good bowel sounds. Extremities: No cyanosis, clubbing, rash, edema; Left ortho boot Neuro: Alert  & oriented x 3, cranial nerves grossly intact. Moves all 4 extremities w/o difficulty. Affect pleasant.  ECG: BiV pacing (personally reviewed).  Optivol: OptiVol elevated, thoracic impedence down, no AF, >99% V-pacing, no VT/VF, daily activity <1 hr (personally reviewed).  ASSESSMENT AND PLAN:   1. Chronic Systolic HF/ Mixed Ischemic/NICM w/ LBBB:  - Echo 6/20 EF 10-15%.(after extraction of CRT) - Echo 12/20 EF 35% s/p MDT CRT-D re-implant (initally removed due to sepsis).  - Echo 12/21 EF 50-55% (normalized with CRT-D) - NYHA III-IIIb, although not very active due to recent amputation. Volume status elevated on device interrogation. - Restart torsemide 20 mg daily. - Hold off on re-starting SGLT2i until renal function results. Discussed with PharmD regarding recent osteomyelitis infection, OK to re-start if no current infection. - Continue carvedilol 3.125 mg bid. - Continue losartan 12.5 mg daily. - Continue Imdur 30 mg daily. - No Entresto or spiro with CKD IV. - Will ask device RN to complete transmission in  7 days to follow fluid trend. - BMET, BNP today; repeat BMET in 10 days.  2. CKD, Stage IV:  - Followed by nephrology (Dr. Rolly Salter [facility]). - Baseline creatinine 2.0-2.5. - Will hold off re-starting SGLT2i until labs are back. - Labs today.  3. PAT & NSVT, Frequent PVCs:  - Suppressed on amiodarone 200 mg daily. - Denies palpitations. Followed by Dr. Caryl Comes.  4. Hypothyroidism:  - On synthroid.  5. CAD:   - s/p prior MI>>LAD DES.  - No s/s angina. - On Plavix and statin.   6. OSA:  - Compliant w/ CPAP using nasal device and chin strap.  7. DM2: - Managed by PCP. - A1c 6.8 (7/22).  8. Foot wound, left - s/p left foot first metatarsal excision, wound debridement 7/22 - Has been referred to [facility] by podiatry. - NWB.  Follow up with APP in 3-4 weeks, then with Dr. Haroldine Laws in 6 months.  [place], FNP  05/21/21

## 2021-05-21 ENCOUNTER — Encounter (HOSPITAL_COMMUNITY): Payer: Self-pay

## 2021-05-21 ENCOUNTER — Ambulatory Visit (HOSPITAL_COMMUNITY)
Admission: RE | Admit: 2021-05-21 | Discharge: 2021-05-21 | Disposition: A | Discharge status: Home / Self Care | Payer: HMO | Source: Ambulatory Visit | Attending: Family Medicine | Admitting: Family Medicine

## 2021-05-21 ENCOUNTER — Other Ambulatory Visit: Payer: Self-pay

## 2021-05-21 VITALS — BP 139/77 | HR 67 | Wt 277.6 lb

## 2021-05-21 DIAGNOSIS — I252 Old myocardial infarction: Secondary | ICD-10-CM | POA: Diagnosis not present

## 2021-05-21 DIAGNOSIS — I5022 Chronic systolic (congestive) heart failure: Secondary | ICD-10-CM | POA: Diagnosis not present

## 2021-05-21 DIAGNOSIS — E1142 Type 2 diabetes mellitus with diabetic polyneuropathy: Secondary | ICD-10-CM | POA: Insufficient documentation

## 2021-05-21 DIAGNOSIS — E039 Hypothyroidism, unspecified: Secondary | ICD-10-CM | POA: Insufficient documentation

## 2021-05-21 DIAGNOSIS — I447 Left bundle-branch block, unspecified: Secondary | ICD-10-CM | POA: Diagnosis not present

## 2021-05-21 DIAGNOSIS — I472 Ventricular tachycardia: Secondary | ICD-10-CM | POA: Diagnosis not present

## 2021-05-21 DIAGNOSIS — Z6841 Body Mass Index (BMI) 40.0 and over, adult: Secondary | ICD-10-CM | POA: Diagnosis not present

## 2021-05-21 DIAGNOSIS — Z886 Allergy status to analgesic agent status: Secondary | ICD-10-CM | POA: Insufficient documentation

## 2021-05-21 DIAGNOSIS — Z8673 Personal history of transient ischemic attack (TIA), and cerebral infarction without residual deficits: Secondary | ICD-10-CM | POA: Diagnosis not present

## 2021-05-21 DIAGNOSIS — I130 Hypertensive heart and chronic kidney disease with heart failure and stage 1 through stage 4 chronic kidney disease, or unspecified chronic kidney disease: Secondary | ICD-10-CM | POA: Diagnosis not present

## 2021-05-21 DIAGNOSIS — E7800 Pure hypercholesterolemia, unspecified: Secondary | ICD-10-CM | POA: Diagnosis not present

## 2021-05-21 DIAGNOSIS — Z95810 Presence of automatic (implantable) cardiac defibrillator: Secondary | ICD-10-CM | POA: Diagnosis not present

## 2021-05-21 DIAGNOSIS — S81802S Unspecified open wound, left lower leg, sequela: Secondary | ICD-10-CM

## 2021-05-21 DIAGNOSIS — Z8349 Family history of other endocrine, nutritional and metabolic diseases: Secondary | ICD-10-CM | POA: Insufficient documentation

## 2021-05-21 DIAGNOSIS — E669 Obesity, unspecified: Secondary | ICD-10-CM | POA: Diagnosis not present

## 2021-05-21 DIAGNOSIS — Z7902 Long term (current) use of antithrombotics/antiplatelets: Secondary | ICD-10-CM | POA: Insufficient documentation

## 2021-05-21 DIAGNOSIS — E1122 Type 2 diabetes mellitus with diabetic chronic kidney disease: Secondary | ICD-10-CM | POA: Insufficient documentation

## 2021-05-21 DIAGNOSIS — I509 Heart failure, unspecified: Secondary | ICD-10-CM

## 2021-05-21 DIAGNOSIS — I2510 Atherosclerotic heart disease of native coronary artery without angina pectoris: Secondary | ICD-10-CM | POA: Diagnosis not present

## 2021-05-21 DIAGNOSIS — Z79890 Hormone replacement therapy (postmenopausal): Secondary | ICD-10-CM | POA: Insufficient documentation

## 2021-05-21 DIAGNOSIS — Z8616 Personal history of COVID-19: Secondary | ICD-10-CM | POA: Insufficient documentation

## 2021-05-21 DIAGNOSIS — I493 Ventricular premature depolarization: Secondary | ICD-10-CM

## 2021-05-21 DIAGNOSIS — N184 Chronic kidney disease, stage 4 (severe): Secondary | ICD-10-CM | POA: Diagnosis not present

## 2021-05-21 DIAGNOSIS — Z888 Allergy status to other drugs, medicaments and biological substances status: Secondary | ICD-10-CM | POA: Diagnosis not present

## 2021-05-21 DIAGNOSIS — Z79899 Other long term (current) drug therapy: Secondary | ICD-10-CM | POA: Diagnosis not present

## 2021-05-21 DIAGNOSIS — Z955 Presence of coronary angioplasty implant and graft: Secondary | ICD-10-CM | POA: Insufficient documentation

## 2021-05-21 DIAGNOSIS — I480 Paroxysmal atrial fibrillation: Secondary | ICD-10-CM | POA: Diagnosis not present

## 2021-05-21 DIAGNOSIS — Z7982 Long term (current) use of aspirin: Secondary | ICD-10-CM | POA: Diagnosis not present

## 2021-05-21 DIAGNOSIS — Z8249 Family history of ischemic heart disease and other diseases of the circulatory system: Secondary | ICD-10-CM | POA: Insufficient documentation

## 2021-05-21 DIAGNOSIS — Z794 Long term (current) use of insulin: Secondary | ICD-10-CM | POA: Insufficient documentation

## 2021-05-21 DIAGNOSIS — Z833 Family history of diabetes mellitus: Secondary | ICD-10-CM | POA: Insufficient documentation

## 2021-05-21 DIAGNOSIS — G4733 Obstructive sleep apnea (adult) (pediatric): Secondary | ICD-10-CM | POA: Diagnosis not present

## 2021-05-21 DIAGNOSIS — I428 Other cardiomyopathies: Secondary | ICD-10-CM | POA: Insufficient documentation

## 2021-05-21 LAB — BASIC METABOLIC PANEL
Anion gap: 7 (ref 5–15)
BUN: 30 mg/dL — ABNORMAL HIGH (ref 8–23)
CO2: 28 mmol/L (ref 22–32)
Calcium: 9 mg/dL (ref 8.9–10.3)
Chloride: 106 mmol/L (ref 98–111)
Creatinine, Ser: 1.56 mg/dL — ABNORMAL HIGH (ref 0.44–1.00)
GFR, Estimated: 36 mL/min — ABNORMAL LOW (ref >60)
Glucose, Bld: 160 mg/dL — ABNORMAL HIGH (ref 70–99)
Potassium: 5.2 mmol/L — ABNORMAL HIGH (ref 3.5–5.1)
Sodium: 141 mmol/L (ref 135–145)

## 2021-05-21 LAB — BRAIN NATRIURETIC PEPTIDE: B Natriuretic Peptide: 279.4 pg/mL — ABNORMAL HIGH (ref 0.0–100.0)

## 2021-05-21 MED ORDER — TORSEMIDE 20 MG PO TABS: 20.0000 mg | ORAL_TABLET | Freq: Every day | ORAL | 3 refills | Status: DC

## 2021-05-21 NOTE — Patient Instructions (Signed)
RESTART Torsemide 20 mg, one tab daily  .Labs today We will only contact you if something comes back abnormal or we need to make some changes. Otherwise no news is good news!  Labs needed in 7-10 days  Your physician recommends that you schedule a follow-up appointment in: 3 week  in the Advanced Practitioners (PA/NP) Clinic    Do the following things EVERYDAY: Weigh yourself in the morning before breakfast. Write it down and keep it in a log. Take your medicines as prescribed Eat low salt foods--Limit salt (sodium) to 2000 mg per day.  Stay as active as you can everyday Limit all fluids for the day to less than 2 liters  milAt the [facility] Clinic, you and your health needs are our priority. As part of our continuing mission to provide you with exceptional heart care, we have created designated Provider Care Teams. These Care Teams include your primary Cardiologist (physician) and Advanced Practice Providers (APPs- Physician Assistants and Nurse Practitioners) who all work together to provide you with the care you need, when you need it.   You may see any of the following providers on your designated Care Team at your next follow up: Dr Glori Bickers Dr [name] Dr Patrice Paradise, NP Lyda Jester, [place] Ginnie Smart Audry Riles, PharmD   Please be sure to bring in all your medications bottles to every appointment.

## 2021-05-22 ENCOUNTER — Encounter: Payer: HMO | Attending: Physician Assistant | Admitting: Physician Assistant

## 2021-05-22 DIAGNOSIS — I110 Hypertensive heart disease with heart failure: Secondary | ICD-10-CM | POA: Diagnosis not present

## 2021-05-22 DIAGNOSIS — Z955 Presence of coronary angioplasty implant and graft: Secondary | ICD-10-CM | POA: Diagnosis not present

## 2021-05-22 DIAGNOSIS — T8130XA Disruption of wound, unspecified, initial encounter: Secondary | ICD-10-CM | POA: Diagnosis not present

## 2021-05-22 DIAGNOSIS — E785 Hyperlipidemia, unspecified: Secondary | ICD-10-CM | POA: Insufficient documentation

## 2021-05-22 DIAGNOSIS — I5042 Chronic combined systolic (congestive) and diastolic (congestive) heart failure: Secondary | ICD-10-CM | POA: Insufficient documentation

## 2021-05-22 DIAGNOSIS — L97522 Non-pressure chronic ulcer of other part of left foot with fat layer exposed: Secondary | ICD-10-CM | POA: Diagnosis not present

## 2021-05-22 DIAGNOSIS — Z6841 Body Mass Index (BMI) 40.0 and over, adult: Secondary | ICD-10-CM | POA: Diagnosis not present

## 2021-05-22 DIAGNOSIS — T8131XA Disruption of external operation (surgical) wound, not elsewhere classified, initial encounter: Secondary | ICD-10-CM | POA: Diagnosis not present

## 2021-05-22 DIAGNOSIS — Z95810 Presence of automatic (implantable) cardiac defibrillator: Secondary | ICD-10-CM | POA: Insufficient documentation

## 2021-05-22 DIAGNOSIS — I252 Old myocardial infarction: Secondary | ICD-10-CM | POA: Insufficient documentation

## 2021-05-22 DIAGNOSIS — E1122 Type 2 diabetes mellitus with diabetic chronic kidney disease: Secondary | ICD-10-CM | POA: Diagnosis not present

## 2021-05-22 DIAGNOSIS — L97822 Non-pressure chronic ulcer of other part of left lower leg with fat layer exposed: Secondary | ICD-10-CM | POA: Diagnosis not present

## 2021-05-22 DIAGNOSIS — E1142 Type 2 diabetes mellitus with diabetic polyneuropathy: Secondary | ICD-10-CM | POA: Diagnosis not present

## 2021-05-22 DIAGNOSIS — N184 Chronic kidney disease, stage 4 (severe): Secondary | ICD-10-CM | POA: Diagnosis not present

## 2021-05-22 DIAGNOSIS — S81812A Laceration without foreign body, left lower leg, initial encounter: Secondary | ICD-10-CM | POA: Diagnosis not present

## 2021-05-22 DIAGNOSIS — X58XXXA Exposure to other specified factors, initial encounter: Secondary | ICD-10-CM | POA: Diagnosis not present

## 2021-05-22 DIAGNOSIS — I132 Hypertensive heart and chronic kidney disease with heart failure and with stage 5 chronic kidney disease, or end stage renal disease: Secondary | ICD-10-CM | POA: Insufficient documentation

## 2021-05-22 NOTE — Progress Notes (Signed)
[name], [name] ([id number]) Visit Report for 05/22/2021 Abuse/Suicide Risk Screen Details Patient Name: [name], [name] Date of Service: 05/22/2021 12:45 PM Medical Record Number: [id number] Patient Account Number: [id number] Date of Birth/Sex: [birth year] (65 y.o. F) Treating RN: Rogers Blocker Primary Care Copelan Maultsby: Crawford Givens Other Clinician: Referring Lenon Kuennen: Ovid Curd Treating Harbor Vanover/Extender: Allen Derry Weeks in Treatment: 0 Abuse/Suicide Risk Screen Items Answer ABUSE RISK SCREEN: Has anyone close to you tried to hurt or harm you recentlyo No Do you feel uncomfortable with anyone in your familyo No Has anyone forced you do things that you didnot want to doo No Electronic Signature(s) Signed: 05/22/2021 4:36:48 PM By: Rogers Blocker RN Entered By: Rogers Blocker on 05/22/2021 12:58:59 [name] ([id number]) -------------------------------------------------------------------------------- Activities of Daily Living Details Patient Name: [name] Date of Service: 05/22/2021 12:45 PM Medical Record Number: [id number] Patient Account Number: [id number] Date of Birth/Sex: [birth year] (65 y.o. F) Treating RN: Rogers Blocker Primary Care Jael Kostick: Crawford Givens Other Clinician: Referring Bee Hammerschmidt: Ovid Curd Treating Montey Ebel/Extender: Rowan Blase in Treatment: 0 Activities of Daily Living Items Answer Activities of Daily Living (Please select one for each item) Drive Automobile Completely Able Take Medications Completely Able Use Telephone Completely Able Care for Appearance Completely Able Use Toilet Completely Able Bath / Shower Completely Able Dress Self Completely Able Feed Self Completely Able Walk Completely Able Get In / Out Bed Completely Able Housework Completely Able Prepare Meals Completely Able Handle Money Completely Able Shop for Self Completely Able Electronic Signature(s) Signed: 05/22/2021 4:36:48 PM By:  Rogers Blocker RN Entered By: Rogers Blocker on 05/22/2021 13:06:32 [name] ([id number]) -------------------------------------------------------------------------------- Education Screening Details Patient Name: [name] Date of Service: 05/22/2021 12:45 PM Medical Record Number: [id number] Patient Account Number: [id number] Date of Birth/Sex: Nov 13, 1955 (65 y.o. F) Treating RN: Rogers Blocker Primary Care Tyah Acord: Crawford Givens Other Clinician: Referring Brinleigh Tew: Ovid Curd Treating Alazae Crymes/Extender: Rowan Blase in Treatment: 0 Primary Learner Assessed: Patient Learning Preferences/Education Level/Primary Language Learning Preference: Explanation, Demonstration Highest Education Level: High School Preferred Language: English Cognitive Barrier Language Barrier: No Translator Needed: No Memory Deficit: No Emotional Barrier: No Cultural/Religious Beliefs Affecting Medical Care: No Physical Barrier Impaired Vision: No Impaired Hearing: No Decreased Hand dexterity: No Knowledge/Comprehension Knowledge Level: Medium Comprehension Level: Medium Ability to understand written instructions: Medium Ability to understand verbal instructions: Medium Motivation Anxiety Level: Calm Cooperation: Cooperative Education Importance: Acknowledges Need Interest in Health Problems: Asks Questions Perception: Coherent Willingness to Engage in Self-Management Medium Activities: Readiness to Engage in Self-Management Medium Activities: Electronic Signature(s) Signed: 05/22/2021 4:36:48 PM By: Rogers Blocker RN Entered By: Rogers Blocker on 05/22/2021 13:07:01 [name] ([id number]) -------------------------------------------------------------------------------- Fall Risk Assessment Details Patient Name: [name] Date of Service: 05/22/2021 12:45 PM Medical Record Number: [id number] Patient Account Number: [id number] Date of  Birth/Sex: Feb 19, 1956 (65 y.o. F) Treating RN: Rogers Blocker Primary Care Jerad Dunlap: Crawford Givens Other Clinician: Referring Joleah Kosak: Ovid Curd Treating Isabelly Kobler/Extender: Rowan Blase in Treatment: 0 Fall Risk Assessment Items Have you had 2 or more falls in the last 12 monthso 0 No Have you had any fall that resulted in injury in the last 12 monthso 0 No FALLS RISK SCREEN History of falling - immediate or within 3 months 0 No Secondary diagnosis (Do you have 2 or more medical diagnoseso) 15 Yes Ambulatory aid None/bed rest/wheelchair/nurse 0 No Crutches/cane/walker 15 Yes Furniture 0 No Intravenous therapy Access/Saline/Heparin Lock 0 No Gait/Transferring Normal/ bed rest/ wheelchair  0 No Weak (short steps with or without shuffle, stooped but able to lift head while walking, may 10 Yes seek support from furniture) Impaired (short steps with shuffle, may have difficulty arising from chair, head down, impaired 0 No balance) Mental Status Oriented to own ability 0 Yes Electronic Signature(s) Signed: 05/22/2021 4:36:48 PM By: Rogers Blocker RN Entered By: Rogers Blocker on 05/22/2021 13:07:14 [name] ([id number]) -------------------------------------------------------------------------------- Foot Assessment Details Patient Name: [name] Date of Service: 05/22/2021 12:45 PM Medical Record Number: [id number] Patient Account Number: [id number] Date of Birth/Sex: 12/31/1955 (65 y.o. F) Treating RN: Rogers Blocker Primary Care Davia Smyre: Crawford Givens Other Clinician: Referring Derico Mitton: Ovid Curd Treating Humza Tallerico/Extender: Allen Derry Weeks in Treatment: 0 Foot Assessment Items Site Locations + = Sensation present, - = Sensation absent, C = Callus, U = Ulcer R = Redness, W = Warmth, M = Maceration, PU = Pre-ulcerative lesion F = Fissure, S = Swelling, D = Dryness Assessment Right: Left: Other Deformity: No No Prior Foot Ulcer: No  No Prior Amputation: No No Charcot Joint: No No Ambulatory Status: Ambulatory With Help Assistance Device: Walker Gait: Steady Electronic Signature(s) Signed: 05/22/2021 4:36:48 PM By: Rogers Blocker RN Entered By: Rogers Blocker on 05/22/2021 13:07:41 [name] ([id number]) -------------------------------------------------------------------------------- Nutrition Risk Screening Details Patient Name: [name] Date of Service: 05/22/2021 12:45 PM Medical Record Number: [id number] Patient Account Number: [id number] Date of Birth/Sex: July 29, 1956 (65 y.o. F) Treating RN: Rogers Blocker Primary Care Jenesa Foresta: Crawford Givens Other Clinician: Referring Velna Hedgecock: Ovid Curd Treating Zyaire Dumas/Extender: Allen Derry Weeks in Treatment: 0 Height (in): 68 Weight (lbs): 278 Body Mass Index (BMI): 42.3 Nutrition Risk Screening Items Score Screening NUTRITION RISK SCREEN: I have an illness or condition that made me change the kind and/or amount of food I eat 0 No I eat fewer than two meals per day 0 No I eat few fruits and vegetables, or milk products 0 No I have three or more drinks of beer, liquor or wine almost every day 0 No I have tooth or mouth problems that make it hard for me to eat 0 No I don't always have enough money to buy the food I need 0 No I eat alone most of the time 0 No I take three or more different prescribed or over-the-counter drugs a day 1 Yes Without wanting to, I have lost or gained 10 pounds in the last six months 0 No I am not always physically able to shop, cook and/or feed myself 0 No Nutrition Protocols Good Risk Protocol 0 No interventions needed Moderate Risk Protocol High Risk Proctocol Risk Level: Good Risk Score: 1 Electronic Signature(s) Signed: 05/22/2021 4:36:48 PM By: Rogers Blocker RN Entered By: Rogers Blocker on 05/22/2021 13:07:22

## 2021-05-22 NOTE — Progress Notes (Signed)
ordered : 05/22/2021 Biologic debridement : 05/22/2021 Enzymatic debridement : 05/22/2021 Excisional debridement : 05/22/2021 Notes: Orientation to the Wound Care Program Nursing Diagnoses: Knowledge deficit related to the wound healing center program Goals: Patient/caregiver will verbalize understanding of the [facility] Date Initiated: 05/22/2021 Target Resolution Date: 05/22/2021 Goal Status: Active Interventions: Provide education on orientation to the wound center Notes: Wound/Skin Impairment Nursing Diagnoses: Impaired tissue integrity Goals: Patient/caregiver will verbalize understanding of skin care regimen Date Initiated: 05/22/2021 Target Resolution Date: 05/22/2021 Goal Status: Active Ulcer/skin breakdown will have a  volume reduction of 30% by week 4 Date Initiated: 05/22/2021 Target Resolution Date: 06/22/2021 Goal Status: Active Ulcer/skin breakdown will have a volume reduction of 50% by week 8 Date Initiated: 05/22/2021 Target Resolution Date: 07/22/2021 [name], [name] ([id number]) Goal Status: Active Ulcer/skin breakdown will have a volume reduction of 80% by week 12 Date Initiated: 05/22/2021 Target Resolution Date: 08/22/2021 Goal Status: Active Ulcer/skin breakdown will heal within 14 weeks Date Initiated: 05/22/2021 Target Resolution Date: 09/21/2021 Goal Status: Active Interventions: Assess patient/caregiver ability to obtain necessary supplies Assess patient/caregiver ability to perform ulcer/skin care regimen upon admission and as needed Assess ulceration(s) every visit Provide education on ulcer and skin care Treatment Activities: Referred to DME [name] for dressing supplies : 05/22/2021 Skin care regimen initiated : 05/22/2021 Notes: Electronic Signature(s) Signed: 05/22/2021 4:36:48 PM By: Vickie Amen RN Entered By: [name] on 05/22/2021 13:54:01 [name] ([id number]) -------------------------------------------------------------------------------- Pain Assessment Details Patient Name: [name] Date of Service: 05/22/2021 12:45 PM Medical Record Number: [id number] Patient Account Number: [id number] Date of Birth/Sex: [birth year] (65 y.o. F) Treating RN: [name] Primary Care [name]: [name] Other Clinician: Referring [name]: [name] Treating [name]/Extender: [name] in Treatment: 0 Active Problems Location of Pain Severity and Description of Pain Patient Has Paino No Site Locations Rate the pain. Current Pain Level: 0 Pain Management and Medication Current Pain Management: Electronic Signature(s) Signed: 05/22/2021 4:36:48 PM By: Vickie Amen RN Entered By: [name] on 05/22/2021  12:49:11 [name] ([id number]) -------------------------------------------------------------------------------- Patient/Caregiver Education Details Patient Name: [name] Date of Service: 05/22/2021 12:45 PM Medical Record Number: [id number] Patient Account Number: [id number] Date of Birth/Gender: 1955/12/23 (65 y.o. F) Treating RN: [name] Primary Care Physician: [name] Other Clinician: Referring Physician: Celesta [name] Treating Physician/Extender: [name] in Treatment: 0 Education Assessment Education Provided To: Patient Education Topics Provided Welcome To [facility]: Methods: Explain/Verbal Responses: State content correctly Wound/Skin Impairment: Methods: Explain/Verbal Responses: State content correctly Electronic Signature(s) Signed: 05/22/2021 4:36:48 PM By: Vickie Amen RN Entered By: [name] on 05/22/2021 13:53:12 [name] ([id number]) -------------------------------------------------------------------------------- Wound Assessment Details Patient Name: [name] Date of Service: 05/22/2021 12:45 PM Medical Record Number: [id number] Patient Account Number: [id number] Date of Birth/Sex: 14-Oct-1955 (65 y.o. F) Treating RN: [name] Primary Care [name]: [name] Other Clinician: Referring [name]: [name] Treating [name]/Extender: [name] Weeks in Treatment: 0 Wound Status Wound Number: 10 Primary Dehisced Wound Etiology: Wound Location: Left, Dorsal Foot Wound Open Wounding Event: Surgical Injury Status: Date Acquired: 12/05/2020 Comorbid Anemia, Arrhythmia, Congestive Heart Failure, Weeks Of Treatment: 0 History: Hypertension, Myocardial Infarction, Type II Diabetes, End Clustered Wound: No Stage Renal Disease, Osteoarthritis, Osteomyelitis, Neuropathy, Seizure Disorder Photos Wound Measurements Length: (cm) 0.5 Width: (cm) 0.3 Depth: (cm)  2.6 Area: (cm) 0.118 Volume: (cm) 0.306 % Reduction in Area: 0% % Reduction in Volume: -53% Epithelialization: None  hospital within the last three years: Yes Total Score: 85 Level Of Care: New/Established - Level 3 [name] ([id number]) Electronic Signature(s) Signed: 05/22/2021 4:36:48 PM By: Vickie Amen RN Entered By: [name] on 05/22/2021 13:52:41 [name] ([id number]) -------------------------------------------------------------------------------- Lower Extremity Assessment Details Patient Name: [name] Date of Service: 05/22/2021 12:45 PM Medical Record Number: [id number] Patient Account Number: [id number] Date of Birth/Sex: 30-Jul-1956 (65 y.o. F) Treating RN: [name] Primary Care [name]: [name] Other Clinician: Referring [name]: [name] Treating [name]/Extender: [name] Weeks in Treatment: 0 Edema Assessment Assessed: [name]: Yes] [Right: No] Edema: [Left: Ye] [Right: s] Vascular Assessment Pulses: Dorsalis Pedis Palpable: [Left:Yes Yes] Notes ABI'c completed on 03/02/21 Electronic Signature(s) Signed: 05/22/2021 4:36:48 PM By: Vickie Amen RN Entered By: [name] on 05/22/2021 13:20:41 [name] ([id number]) -------------------------------------------------------------------------------- Multi Wound Chart Details Patient Name: [name] Date of Service: 05/22/2021 12:45 PM Medical Record Number: [id number] Patient Account Number: [id number] Date of Birth/Sex: 02-05-1956 (65 y.o. F) Treating RN: [name] Primary Care Dushawn Pusey: [name] Other Clinician: Referring Sharif Rendell: [name] Treating Caylynn Minchew/Extender: [name] Weeks in Treatment: 0 Vital Signs Height(in): 6 Pulse(bpm):  39 Weight(lbs): 278 Blood Pressure(mmHg): 155/77 Body Mass Index(BMI): 42 Temperature(F): 98.3 Respiratory Rate(breaths/min): 18 Photos: [N/A:N/A] Wound Location: Left, Dorsal Foot Left Lower Leg N/A Wounding Event: Surgical Injury Trauma N/A Primary Etiology: Dehisced Wound Trauma, Other N/A Comorbid History: Anemia, Arrhythmia, Congestive Anemia, Arrhythmia, Congestive N/A Heart Failure, Hypertension, Heart Failure, Hypertension, Myocardial Infarction, Type II Myocardial Infarction, Type II Diabetes, End Stage Renal Disease, Diabetes, End Stage Renal Disease, Osteoarthritis, Osteomyelitis, Osteoarthritis, Osteomyelitis, Neuropathy, Seizure Disorder Neuropathy, Seizure Disorder Date Acquired: 12/05/2020 02/04/2021 N/A Weeks of Treatment: 0 0 N/A Wound Status: Open Open N/A Measurements L x W x D (cm) 0.5x0.3x2.6 1.7x1x0.1 N/A Area (cm) : 0.118 1.335 N/A Volume (cm) : 0.306 0.134 N/A % Reduction in Area: 0.00% 0.00% N/A % Reduction in Volume: -53.00% 0.00% N/A Classification: Full Thickness Without Exposed Full Thickness Without Exposed N/A Support Structures Support Structures Exudate Amount: Medium Medium N/A Exudate Type: Serosanguineous Serosanguineous N/A Exudate Color: red, brown red, brown N/A Granulation Amount: None Present (0%) Large (67-100%) N/A Granulation Quality: N/A Red, Pink N/A Necrotic Amount: None Present (0%) None Present (0%) N/A Exposed Structures: Fat Layer (Subcutaneous Tissue): Fat Layer (Subcutaneous Tissue): N/A Yes Yes Fascia: No Fascia: No Tendon: No Tendon: No Muscle: No Muscle: No Joint: No Joint: No Bone: No Bone: No Epithelialization: None Medium (34-66%) N/A Treatment Notes Electronic Signature(s) Signed: 05/22/2021 4:36:48 PM By: Vickie Amen RN Entered By: [name] on 05/22/2021 13:43:31 [name] ([id number]) [name]  ([id number]) -------------------------------------------------------------------------------- [place] Details Patient Name: [name] Date of Service: 05/22/2021 12:45 PM Medical Record Number: [id number] Patient Account Number: [id number] Date of Birth/Sex: 19-Jun-1956 (65 y.o. F) Treating RN: [name] Primary Care Aviela Blundell: [name] Other Clinician: Referring Love Milbourne: [name] Treating Jaimya Feliciano/Extender: [name] in Treatment: 0 Active Inactive Necrotic Tissue Nursing Diagnoses: Impaired tissue integrity related to necrotic/devitalized tissue Goals: Necrotic/devitalized tissue will be minimized in the wound bed Date Initiated: 05/22/2021 Target Resolution Date: 05/22/2021 Goal Status: Active Patient/caregiver will verbalize understanding of reason and process for debridement of necrotic tissue Date Initiated: 05/22/2021 Target Resolution Date: 05/22/2021 Goal Status: Active Interventions: Assess patient pain level pre-, during and post procedure and prior to discharge Provide education on necrotic tissue and debridement process Treatment Activities: Apply topical anesthetic as  ordered : 05/22/2021 Biologic debridement : 05/22/2021 Enzymatic debridement : 05/22/2021 Excisional debridement : 05/22/2021 Notes: Orientation to the Wound Care Program Nursing Diagnoses: Knowledge deficit related to the wound healing center program Goals: Patient/caregiver will verbalize understanding of the [facility] Date Initiated: 05/22/2021 Target Resolution Date: 05/22/2021 Goal Status: Active Interventions: Provide education on orientation to the wound center Notes: Wound/Skin Impairment Nursing Diagnoses: Impaired tissue integrity Goals: Patient/caregiver will verbalize understanding of skin care regimen Date Initiated: 05/22/2021 Target Resolution Date: 05/22/2021 Goal Status: Active Ulcer/skin breakdown will have a  volume reduction of 30% by week 4 Date Initiated: 05/22/2021 Target Resolution Date: 06/22/2021 Goal Status: Active Ulcer/skin breakdown will have a volume reduction of 50% by week 8 Date Initiated: 05/22/2021 Target Resolution Date: 07/22/2021 [name], [name] ([id number]) Goal Status: Active Ulcer/skin breakdown will have a volume reduction of 80% by week 12 Date Initiated: 05/22/2021 Target Resolution Date: 08/22/2021 Goal Status: Active Ulcer/skin breakdown will heal within 14 weeks Date Initiated: 05/22/2021 Target Resolution Date: 09/21/2021 Goal Status: Active Interventions: Assess patient/caregiver ability to obtain necessary supplies Assess patient/caregiver ability to perform ulcer/skin care regimen upon admission and as needed Assess ulceration(s) every visit Provide education on ulcer and skin care Treatment Activities: Referred to DME [name] for dressing supplies : 05/22/2021 Skin care regimen initiated : 05/22/2021 Notes: Electronic Signature(s) Signed: 05/22/2021 4:36:48 PM By: Vickie Amen RN Entered By: [name] on 05/22/2021 13:54:01 [name] ([id number]) -------------------------------------------------------------------------------- Pain Assessment Details Patient Name: [name] Date of Service: 05/22/2021 12:45 PM Medical Record Number: [id number] Patient Account Number: [id number] Date of Birth/Sex: [birth year] (65 y.o. F) Treating RN: [name] Primary Care [name]: [name] Other Clinician: Referring [name]: [name] Treating [name]/Extender: [name] in Treatment: 0 Active Problems Location of Pain Severity and Description of Pain Patient Has Paino No Site Locations Rate the pain. Current Pain Level: 0 Pain Management and Medication Current Pain Management: Electronic Signature(s) Signed: 05/22/2021 4:36:48 PM By: Vickie Amen RN Entered By: [name] on 05/22/2021  12:49:11 [name] ([id number]) -------------------------------------------------------------------------------- Patient/Caregiver Education Details Patient Name: [name] Date of Service: 05/22/2021 12:45 PM Medical Record Number: [id number] Patient Account Number: [id number] Date of Birth/Gender: 1955/12/23 (65 y.o. F) Treating RN: [name] Primary Care Physician: [name] Other Clinician: Referring Physician: Celesta [name] Treating Physician/Extender: [name] in Treatment: 0 Education Assessment Education Provided To: Patient Education Topics Provided Welcome To [facility]: Methods: Explain/Verbal Responses: State content correctly Wound/Skin Impairment: Methods: Explain/Verbal Responses: State content correctly Electronic Signature(s) Signed: 05/22/2021 4:36:48 PM By: Vickie Amen RN Entered By: [name] on 05/22/2021 13:53:12 [name] ([id number]) -------------------------------------------------------------------------------- Wound Assessment Details Patient Name: [name] Date of Service: 05/22/2021 12:45 PM Medical Record Number: [id number] Patient Account Number: [id number] Date of Birth/Sex: 14-Oct-1955 (65 y.o. F) Treating RN: [name] Primary Care [name]: [name] Other Clinician: Referring [name]: [name] Treating [name]/Extender: [name] Weeks in Treatment: 0 Wound Status Wound Number: 10 Primary Dehisced Wound Etiology: Wound Location: Left, Dorsal Foot Wound Open Wounding Event: Surgical Injury Status: Date Acquired: 12/05/2020 Comorbid Anemia, Arrhythmia, Congestive Heart Failure, Weeks Of Treatment: 0 History: Hypertension, Myocardial Infarction, Type II Diabetes, End Clustered Wound: No Stage Renal Disease, Osteoarthritis, Osteomyelitis, Neuropathy, Seizure Disorder Photos Wound Measurements Length: (cm) 0.5 Width: (cm) 0.3 Depth: (cm)  2.6 Area: (cm) 0.118 Volume: (cm) 0.306 % Reduction in Area: 0% % Reduction in Volume: -53% Epithelialization: None  ordered : 05/22/2021 Biologic debridement : 05/22/2021 Enzymatic debridement : 05/22/2021 Excisional debridement : 05/22/2021 Notes: Orientation to the Wound Care Program Nursing Diagnoses: Knowledge deficit related to the wound healing center program Goals: Patient/caregiver will verbalize understanding of the [facility] Date Initiated: 05/22/2021 Target Resolution Date: 05/22/2021 Goal Status: Active Interventions: Provide education on orientation to the wound center Notes: Wound/Skin Impairment Nursing Diagnoses: Impaired tissue integrity Goals: Patient/caregiver will verbalize understanding of skin care regimen Date Initiated: 05/22/2021 Target Resolution Date: 05/22/2021 Goal Status: Active Ulcer/skin breakdown will have a  volume reduction of 30% by week 4 Date Initiated: 05/22/2021 Target Resolution Date: 06/22/2021 Goal Status: Active Ulcer/skin breakdown will have a volume reduction of 50% by week 8 Date Initiated: 05/22/2021 Target Resolution Date: 07/22/2021 [name], [name] ([id number]) Goal Status: Active Ulcer/skin breakdown will have a volume reduction of 80% by week 12 Date Initiated: 05/22/2021 Target Resolution Date: 08/22/2021 Goal Status: Active Ulcer/skin breakdown will heal within 14 weeks Date Initiated: 05/22/2021 Target Resolution Date: 09/21/2021 Goal Status: Active Interventions: Assess patient/caregiver ability to obtain necessary supplies Assess patient/caregiver ability to perform ulcer/skin care regimen upon admission and as needed Assess ulceration(s) every visit Provide education on ulcer and skin care Treatment Activities: Referred to DME [name] for dressing supplies : 05/22/2021 Skin care regimen initiated : 05/22/2021 Notes: Electronic Signature(s) Signed: 05/22/2021 4:36:48 PM By: Vickie Amen RN Entered By: [name] on 05/22/2021 13:54:01 [name] ([id number]) -------------------------------------------------------------------------------- Pain Assessment Details Patient Name: [name] Date of Service: 05/22/2021 12:45 PM Medical Record Number: [id number] Patient Account Number: [id number] Date of Birth/Sex: [birth year] (65 y.o. F) Treating RN: [name] Primary Care [name]: [name] Other Clinician: Referring [name]: [name] Treating [name]/Extender: [name] in Treatment: 0 Active Problems Location of Pain Severity and Description of Pain Patient Has Paino No Site Locations Rate the pain. Current Pain Level: 0 Pain Management and Medication Current Pain Management: Electronic Signature(s) Signed: 05/22/2021 4:36:48 PM By: Vickie Amen RN Entered By: [name] on 05/22/2021  12:49:11 [name] ([id number]) -------------------------------------------------------------------------------- Patient/Caregiver Education Details Patient Name: [name] Date of Service: 05/22/2021 12:45 PM Medical Record Number: [id number] Patient Account Number: [id number] Date of Birth/Gender: 1955/12/23 (65 y.o. F) Treating RN: [name] Primary Care Physician: [name] Other Clinician: Referring Physician: Celesta [name] Treating Physician/Extender: [name] in Treatment: 0 Education Assessment Education Provided To: Patient Education Topics Provided Welcome To [facility]: Methods: Explain/Verbal Responses: State content correctly Wound/Skin Impairment: Methods: Explain/Verbal Responses: State content correctly Electronic Signature(s) Signed: 05/22/2021 4:36:48 PM By: Vickie Amen RN Entered By: [name] on 05/22/2021 13:53:12 [name] ([id number]) -------------------------------------------------------------------------------- Wound Assessment Details Patient Name: [name] Date of Service: 05/22/2021 12:45 PM Medical Record Number: [id number] Patient Account Number: [id number] Date of Birth/Sex: 14-Oct-1955 (65 y.o. F) Treating RN: [name] Primary Care [name]: [name] Other Clinician: Referring [name]: [name] Treating [name]/Extender: [name] Weeks in Treatment: 0 Wound Status Wound Number: 10 Primary Dehisced Wound Etiology: Wound Location: Left, Dorsal Foot Wound Open Wounding Event: Surgical Injury Status: Date Acquired: 12/05/2020 Comorbid Anemia, Arrhythmia, Congestive Heart Failure, Weeks Of Treatment: 0 History: Hypertension, Myocardial Infarction, Type II Diabetes, End Clustered Wound: No Stage Renal Disease, Osteoarthritis, Osteomyelitis, Neuropathy, Seizure Disorder Photos Wound Measurements Length: (cm) 0.5 Width: (cm) 0.3 Depth: (cm)  2.6 Area: (cm) 0.118 Volume: (cm) 0.306 % Reduction in Area: 0% % Reduction in Volume: -53% Epithelialization: None

## 2021-05-23 NOTE — Telephone Encounter (Signed)
Called patient and was able to get BS readings; patient does not keep BP readings and her machine she has is dead.   Jun 08, 2023 - fasting am 89; bedtime 234 8/13 - fasting am 81; 164 at super and 162 at bedtime 8/14 - fasting am 82; 136 at super and 183 at bedtime 8/15 - fasting am 86; 274 at bedtime 8/16 - fasting am 82; 135 at super; 168 at bedtime 8/17 - fasting am 84

## 2021-05-23 NOTE — Telephone Encounter (Signed)
Patient notified no changes needed at this time; BS and BP doing well.

## 2021-05-23 NOTE — Telephone Encounter (Addendum)
Thanks for the update.  Those blood sugar readings are reasonable, given her situation.  I would not change her medications based on those.  Her blood pressure was reasonable on recent check with cardiology and they addressed her blood pressure medications.  I do not have any advice/changes to make at this point.  Thanks.

## 2021-05-23 NOTE — Progress Notes (Signed)
Subjective: [name] is a 65 y.o. is seen today in office s/p left foot first metatarsal excision, wound debridement preformed on April 25, 2021.  She is to continue with offloading transcatheter is much as possible.  She has a follow-up with the wound care center scheduled.  Denies any fevers or chills.  No nausea or vomiting.  She has no other concerns.   Objective: General: No acute distress, AAOx3  Left foot: Dressing clean, dry, intact.  Incision is healing well except on the central aspect where there is probing noted approximate 1 cm but not to bone.  No undermining or tunneling otherwise.  There is no erythema or warmth to the foot.  Minimal edema.  No fluctuation crepitation.  No malodor. No other open lesions or pre-ulcerative lesions.  No pain with calf compression, swelling, warmth, erythema.   Assessment and Plan:  Status post left foot surgery,  -Treatment options discussed including all alternatives, risks, and complications -Central aspect opened up improving.  She has wound care follow-up scheduled for next week.  Now packing of the wound.  Irrigate with saline.  Continue cam boot and limit weightbearing.  Elevation. -Monitor for any clinical signs or symptoms of infection and directed to call the office immediately should any occur or go to the ER.  Trula Slade DPM

## 2021-05-24 ENCOUNTER — Encounter: Payer: HMO | Admitting: Podiatry

## 2021-05-24 DIAGNOSIS — S81802D Unspecified open wound, left lower leg, subsequent encounter: Secondary | ICD-10-CM | POA: Diagnosis not present

## 2021-05-24 DIAGNOSIS — T8189XA Other complications of procedures, not elsewhere classified, initial encounter: Secondary | ICD-10-CM | POA: Diagnosis not present

## 2021-05-24 NOTE — Progress Notes (Signed)
SHARIS, KEERAN ([id number]) Visit Report for 05/22/2021 Chief Complaint Document Details Patient Name: [name], [name] Date of Service: 05/22/2021 12:45 PM Medical Record Number: [id number] Patient Account Number: [id number] Date of Birth/Sex: 08-31-1956 (65 y.o. F) Treating RN: Dolan Amen Primary Care Provider: Elsie Stain Other Clinician: Referring Provider: Celesta Gentile Treating Provider/Extender: Skipper Cliche in Treatment: 0 Information Obtained from: Patient Chief Complaint Left LE and Left Foot Ulcer Electronic Signature(s) Signed: 05/22/2021 1:33:49 PM By: Worthy Keeler PA-C Entered By: Worthy Keeler on 05/22/2021 13:33:49 [name] ([id number]) -------------------------------------------------------------------------------- Debridement Details Patient Name: [name] Date of Service: 05/22/2021 12:45 PM Medical Record Number: [id number] Patient Account Number: [id number] Date of Birth/Sex: [birth year] (65 y.o. F) Treating RN: Dolan Amen Primary Care Provider: Elsie Stain Other Clinician: Referring Provider: Celesta Gentile Treating Provider/Extender: Jeri Cos Weeks in Treatment: 0 Debridement Performed for Wound #10 Left,Dorsal Foot Assessment: Performed By: Physician Tommie Sams., PA-C Debridement Type: Debridement Level of Consciousness (Pre- Awake and Alert procedure): Pre-procedure Verification/Time Out Yes - 13:43 Taken: Start Time: 13:43 Total Area Debrided (L x W): 1 (cm) x 1 (cm) = 1 (cm) Tissue and other material Viable, Non-Viable, Slough, Subcutaneous, Slough debrided: Level: Skin/Subcutaneous Tissue Debridement Description: Excisional Instrument: Curette Bleeding: Minimum Hemostasis Achieved: Pressure Response to Treatment: Procedure was tolerated well Level of Consciousness (Post- Awake and Alert procedure): Post Debridement Measurements of Total Wound Length: (cm) 0.5 Width: (cm)  0.3 Depth: (cm) 2.6 Volume: (cm) 0.306 Character of Wound/Ulcer Post Debridement: Stable Post Procedure Diagnosis Same as Pre-procedure Electronic Signature(s) Signed: 05/22/2021 4:36:48 PM By: Dolan Amen RN Signed: 05/24/2021 4:48:07 PM By: Worthy Keeler PA-C Entered By: Dolan Amen on 05/22/2021 13:45:08 [name] ([id number]) -------------------------------------------------------------------------------- Debridement Details Patient Name: [name] Date of Service: 05/22/2021 12:45 PM Medical Record Number: [id number] Patient Account Number: [id number] Date of Birth/Sex: [birth year] (65 y.o. F) Treating RN: Dolan Amen Primary Care Provider: Elsie Stain Other Clinician: Referring Provider: Celesta Gentile Treating Provider/Extender: Jeri Cos Weeks in Treatment: 0 Debridement Performed for Wound #9 Left Lower Leg Assessment: Performed By: Physician Tommie Sams., PA-C Debridement Type: Debridement Level of Consciousness (Pre- Awake and Alert procedure): Pre-procedure Verification/Time Out Yes - 13:43 Taken: Start Time: 13:43 Total Area Debrided (L x W): 1.7 (cm) x 1 (cm) = 1.7 (cm) Tissue and other material Viable, Non-Viable, Slough, Subcutaneous, Slough debrided: Level: Skin/Subcutaneous Tissue Debridement Description: Excisional Instrument: Curette Bleeding: Moderate Hemostasis Achieved: Silver Nitrate Response to Treatment: Procedure was tolerated well Level of Consciousness (Post- Awake and Alert procedure): Post Debridement Measurements of Total Wound Length: (cm) 1.7 Width: (cm) 1 Depth: (cm) 0.2 Volume: (cm) 0.267 Character of Wound/Ulcer Post Debridement: Stable Post Procedure Diagnosis Same as Pre-procedure Notes 1 silver nitrate stick used Electronic Signature(s) Signed: 05/22/2021 4:36:48 PM By: Dolan Amen RN Signed: 05/24/2021 4:48:07 PM By: Worthy Keeler PA-C Entered By: Dolan Amen on 05/22/2021  13:51:08 [name] ([id number]) -------------------------------------------------------------------------------- HPI Details Patient Name: [name] Date of Service: 05/22/2021 12:45 PM Medical Record Number: [id number] Patient Account Number: [id number] Date of Birth/Sex: 03/09/1956 (65 y.o. F) Treating RN: Dolan Amen Primary Care Provider: Elsie Stain Other Clinician: Referring Provider: Celesta Gentile Treating Provider/Extender: Skipper Cliche in Treatment: 0 History of Present Illness HPI Description: 65 year old patient who is known to have diabetes for several years and generally has it under good control last hemoglobin A1c was 6.7 done 2 weeks ago. She has several other comorbidities  including acute CHF, hypoxia, hyperlipidemia, systolic heart failure, myocardial infarction, coronary artery disease, iron deficiency anemia, renal insufficiency, essential hypertension, morbid obesity and obstructive sleep apnea. 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 In the past she's had a coronary angioplasty with stent in 2013, laparoscopic cholecystectomy in 2011, bilateral oophorectomy in April 2012 and she's had some lumbar disc surgery in 1996. She says she's had a lot of peripheral neuropathy and due to this she's had a ulcer on her left second toe for a while. She had a dry eschar on her left heel and recently this was removed and she found that she had some infection and some drainage from there. She has minimal pain around the heel but no pain on any areas of her toes. she has not been on any antibiotics recently and has known vascular problems in the recent past. Readmission: 05/22/2021 upon evaluation today patient appears to be doing somewhat poorly in regard to the wound that is present on her left lateral lower leg as well as the left dorsal foot. The foot is very minimal and seems to be doing decently well  the leg is a little bit worse. The foot is been present since about March 1 and the leg since about May 1. Subsequently the patient tells me that this is due to a trauma. She does have diabetes. With that being said she also has chronic kidney disease stage IV, congestive heart failure, and hypertension. She tells me that this is actually gotten significantly better since the initial injury. With that being said there still is a significant issue here that we do need to try to get under control. The patient voiced understanding. Nonetheless I do believe that this is going to require regular wound care here in the clinic with [place]. Patient's wounds are going also require some sharp debridement to clear away some of the necrotic tissue from both locations and I think this is absolutely appropriate based on what we are seeing today I think it would also help with getting things moving in a much better direction in general. Electronic Signature(s) Signed: 05/24/2021 9:20:20 AM By: Worthy Keeler PA-C Entered By: Worthy Keeler on 05/24/2021 09:20:20 [name] ([id number]) -------------------------------------------------------------------------------- Physical Exam Details Patient Name: [name] Date of Service: 05/22/2021 12:45 PM Medical Record Number: [id number] Patient Account Number: [id number] Date of Birth/Sex: [birth year] (65 y.o. F) Treating RN: Dolan Amen Primary Care Provider: Elsie Stain Other Clinician: Referring Provider: Celesta Gentile Treating Provider/Extender: Jeri Cos Weeks in Treatment: 0 Constitutional sitting or standing blood pressure is within target range for patient.. pulse regular and within target range for patient.Marland Kitchen respirations regular, non- labored and within target range for patient.Marland Kitchen temperature within target range for patient.. Well-nourished and well-hydrated in no acute distress. Eyes conjunctiva clear no eyelid edema noted. pupils  equal round and reactive to light and accommodation. Ears, Nose, Mouth, and Throat no gross abnormality of ear auricles or external auditory canals. normal hearing noted during conversation. mucus membranes moist. Respiratory normal breathing without difficulty. Cardiovascular 2+ dorsalis pedis/posterior tibialis pulses. 1+ pitting edema of the bilateral lower extremities. Musculoskeletal normal gait and posture. no significant deformity or arthritic changes, no loss or range of motion, no clubbing. Psychiatric this patient is able to make decisions and demonstrates good insight into disease process. Alert and Oriented x 3. pleasant and cooperative. Notes Upon inspection patient's wound bed actually showed signs of having necrotic tissue on  the surface of the wound she did have some hypergranulation as well in regard to the leg. I did perform debridement to clear away both the necrotic tissue as well as the hypergranular tissue and postdebridement the wound bed appears to be doing much better which is great news at both locations. Fortunately I think that she is definitely showing signs of healing already and I think with the appropriate treatment here including compression therapy I think she will actually do quite well. Electronic Signature(s) Signed: 05/24/2021 9:20:56 AM By: Worthy Keeler PA-C Entered By: Worthy Keeler on 05/24/2021 09:20:56 [name] ([id number]) -------------------------------------------------------------------------------- Physician Orders Details Patient Name: [name] Date of Service: 05/22/2021 12:45 PM Medical Record Number: [id number] Patient Account Number: [id number] Date of Birth/Sex: 1956/04/10 (65 y.o. 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 [place] for wound care. May utilize formulary equivalent dressing for wound treatment orders unless otherwise specified. Home Health Nurse may visit [facility] to address patientos wound care needs. o Scheduled days for dressing changes to be completed; exception, patient has scheduled wound care visit that day. o **Please direct any NON-WOUND related issues/requests for orders to patient's Primary Care Physician. **If current dressing causes regression in wound condition, may D/C ordered dressing product/s and apply Normal Saline Moist Dressing daily until next [place] or Other MD appointment. **Notify [facility] of regression in wound condition at [phone]. Bathing/ Shower/ Hygiene o May shower; gently cleanse wound with antibacterial soap, rinse and pat dry prior to dressing wounds Medications-Please add to medication list. o P.O. Antibiotics - Start Augmentin Wound Treatment Wound #10 - Foot Wound Laterality: Dorsal, Left Cleanser: Normal Saline 3 x Per Week/30 Days Discharge Instructions: Wash your hands with soap and water. Remove old dressing, discard into plastic bag and place into trash. Cleanse the wound with Normal Saline prior to applying a clean dressing using gauze sponges, not tissues or cotton balls. Do not scrub or use excessive force. Pat dry using gauze sponges, not tissue or cotton balls. Primary Dressing:  Hydrofera Blue Classic Foam Rope Dressing, 9x6 (mm/in) 3 x Per Week/30 Days Discharge Instructions: Cut about 3 cm in length, cut in half-pack in wound and moisten with a couple drops of saline (extra sent with patient) Secondary Dressing: Mepilex Border Flex, 4x4 (in/in) 3 x Per Week/30 Days Discharge Instructions: Apply to wound as directed. Do not cut. Wound #9 - Lower Leg Wound Laterality: Left Cleanser: Normal Saline 3 x Per Week/30 Days Discharge Instructions: Wash your hands with soap and water. Remove old dressing, discard into plastic bag and place into trash. Cleanse the wound with Normal Saline prior to applying a clean dressing using gauze sponges, not tissues or cotton balls. Do not scrub or use excessive force. Pat dry using gauze sponges, not tissue or cotton balls. Primary Dressing: Hydrofera Blue Ready Transfer Foam, 2.5x2.5 (in/in) 3 x Per Week/30 Days Discharge  Instructions: Apply Hydrofera Blue Ready to wound bed as directed Secondary Dressing: Mepilex Border Flex, 4x4 (in/in) 3 x Per Week/30 Days Discharge Instructions: Apply to wound as directed. Do not cut. CHARLE, MCLAURIN ([id number]) Patient Medications Allergies: heparin Notifications Medication Indication Start End Augmentin 05/22/2021 DOSE 1 - oral 875 mg-125 mg tablet - 1 tablet oral taken 2 times per day for 10 days Electronic Signature(s) Signed: 05/22/2021 2:29:27 PM By: Worthy Keeler PA-C Entered By: Worthy Keeler on 05/22/2021 14:29:26 [name] ([id number]) -------------------------------------------------------------------------------- Problem List Details Patient Name: [name] Date of Service: 05/22/2021 12:45 PM Medical Record Number: [id number] Patient Account Number: [id number] Date of Birth/Sex: 11-05-1955 (65 y.o. F) Treating RN: Dolan Amen Primary Care Provider: Elsie Stain Other Clinician: Referring Provider: Celesta Gentile Treating Provider/Extender: Jeri Cos Weeks in Treatment: 0 Active Problems ICD-10 Encounter Code Description Active Date MDM Diagnosis E11.621 Type 2 diabetes mellitus with foot ulcer 05/22/2021 No Yes L97.522 Non-pressure chronic ulcer of other part of left foot with fat layer 05/22/2021 No Yes exposed S81.812A Laceration without foreign body, left lower leg, initial encounter 05/22/2021 No Yes N18.4 Chronic kidney disease, stage 4 (severe) 05/22/2021 No Yes I50.42 Chronic combined systolic (congestive) and diastolic (congestive) heart 05/22/2021 No Yes failure I10 Essential (primary) hypertension 05/22/2021 No Yes Inactive Problems Resolved Problems Electronic Signature(s) Signed: 05/22/2021 1:32:02 PM By: Worthy Keeler PA-C Entered By: Worthy Keeler on 05/22/2021 13:32:02 [name] ([id number]) -------------------------------------------------------------------------------- Progress Note Details Patient Name: [name] Date of Service: 05/22/2021 12:45 PM Medical Record Number: [id number] Patient Account Number: [id number] Date of Birth/Sex: March 28, 1956 (65 y.o. F) Treating RN: Dolan Amen Primary Care Provider: Elsie Stain Other Clinician: Referring Provider: Celesta Gentile Treating Provider/Extender: Skipper Cliche in Treatment: 0 Subjective Chief Complaint Information obtained from Patient Left LE and Left Foot Ulcer History of Present Illness (HPI) 65 year old patient who is known to have diabetes for several years and generally has it under good control last hemoglobin A1c was 6.7 done 2 weeks ago. She has several other comorbidities including acute CHF, hypoxia, hyperlipidemia, systolic heart failure, myocardial infarction, coronary artery disease, iron deficiency anemia, renal insufficiency, essential hypertension, morbid obesity and obstructive sleep apnea. 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 In the past she's had a  coronary angioplasty with stent in 2013, laparoscopic cholecystectomy in 2011, bilateral oophorectomy in April 2012 and she's had some lumbar disc surgery in 1996. She says she's had a lot of peripheral neuropathy and due to this she's had a ulcer on her left second toe for a while. She had a dry eschar on her left heel and recently this was removed and she found that she had some infection and some drainage from there. She has minimal pain around the heel but no pain on any areas of her toes. she has not been on any antibiotics recently and has known vascular problems in the recent past. Readmission: 05/22/2021 upon evaluation today patient appears to be doing somewhat poorly in regard to the wound that is present on her left lateral lower leg as well as the left dorsal foot. The foot is very minimal and seems to be doing decently well the leg is a little bit worse. The foot is been present since about March 1 and the leg since about May 1. Subsequently the patient tells me that this is due to a trauma. She does have diabetes. With that being said she  also has chronic kidney disease stage IV, congestive heart failure, and hypertension. She tells me that this is actually gotten significantly better since the initial injury. With that being said there still is a significant issue here that we do need to try to get under control. The patient voiced understanding. Nonetheless I do believe that this is going to require regular wound care here in the clinic with [place]. Patient's wounds are going also require some sharp debridement to clear away some of the necrotic tissue from both locations and I think this is absolutely appropriate based on what we are seeing today I think it would also help with getting things moving in a much better direction in general. Patient History Information obtained from Patient. Allergies heparin Family History Cancer - Father, Diabetes - Paternal Grandparents, Heart Disease -  Maternal Grandparents, No family history of Hereditary Spherocytosis, Hypertension, Kidney Disease, Lung Disease, Seizures, Stroke, Thyroid Problems, Tuberculosis. Social History Never smoker, Marital Status - Widowed, Alcohol Use - Never, Drug Use - No History, Caffeine Use - Daily - coffee. Medical History Eyes Denies history of Cataracts, Glaucoma, Optic Neuritis Ear/Nose/Mouth/Throat Denies history of Chronic sinus problems/congestion, Middle ear problems Hematologic/Lymphatic Patient has history of Anemia Denies history of Hemophilia, Human Immunodeficiency Virus, Lymphedema, Sickle Cell Disease Respiratory Denies history of Aspiration, Asthma, Chronic Obstructive Pulmonary Disease (COPD), Pneumothorax, Sleep Apnea, Tuberculosis Cardiovascular Patient has history of Arrhythmia, Congestive Heart Failure, Hypertension, Myocardial Infarction Denies history of Hypotension, Peripheral Arterial Disease, Peripheral Venous Disease, Phlebitis, Vasculitis Gastrointestinal Denies history of Cirrhosis , Colitis, Crohn s, Hepatitis A, Hepatitis B, Hepatitis C Endocrine Patient has history of Type II Diabetes Genitourinary Patient has history of End Stage Renal Disease - stage 4 kidney failure Immunological Denies history of Lupus Erythematosus, Raynaud s, Scleroderma Integumentary (Skin) Denies history of History of Burn, History of pressure wounds Musculoskeletal Patient has history of Osteoarthritis, Osteomyelitis MELEANA, [place] ([id number]) Denies history of Gout, Rheumatoid Arthritis Neurologic Patient has history of Neuropathy, Seizure Disorder Denies history of Dementia, Quadriplegia, Paraplegia Oncologic Denies history of Received Chemotherapy, Received Radiation Psychiatric Denies history of Anorexia/bulimia, Confinement Anxiety Patient is treated with Insulin. Blood sugar is tested. Medical And Surgical History Notes Cardiovascular pacemaker, defibilator Review of  Systems (ROS) Constitutional Symptoms (General Health) Denies complaints or symptoms of Fatigue, Fever, Chills, Marked Weight Change. Eyes Denies complaints or symptoms of Dry Eyes, Vision Changes, Glasses / Contacts. Ear/Nose/Mouth/Throat Denies complaints or symptoms of Difficult clearing ears, Sinusitis. Hematologic/Lymphatic Denies complaints or symptoms of Bleeding / Clotting Disorders, Human Immunodeficiency Virus. Respiratory Denies complaints or symptoms of Chronic or frequent coughs, Shortness of Breath. Cardiovascular Denies complaints or symptoms of Chest pain, LE edema. Gastrointestinal Denies complaints or symptoms of Frequent diarrhea, Nausea, Vomiting. Endocrine Denies complaints or symptoms of Hepatitis, Thyroid disease, Polydypsia (Excessive Thirst). Genitourinary Denies complaints or symptoms of Kidney failure/ Dialysis, Incontinence/dribbling. Immunological Denies complaints or symptoms of Hives, Itching. Integumentary (Skin) Complains or has symptoms of Wounds, Swelling. Denies complaints or symptoms of Bleeding or bruising tendency, Breakdown. Musculoskeletal Denies complaints or symptoms of Muscle Pain, Muscle Weakness. Neurologic Denies complaints or symptoms of Numbness/parasthesias, Focal/Weakness. Psychiatric Denies complaints or symptoms of Anxiety, Claustrophobia. Objective Constitutional sitting or standing blood pressure is within target range for patient.. pulse regular and within target range for patient.Marland Kitchen respirations regular, non- labored and within target range for patient.Marland Kitchen temperature within target range for patient.. Well-nourished and well-hydrated in no acute distress. Vitals Time Taken: 12:49 PM, Height: 68 in, Source: Stated, Weight: 278  lbs, Source: Measured, BMI: 42.3, Temperature: 98.3 F, Pulse: 63 bpm, Respiratory Rate: 18 breaths/min, Blood Pressure: 155/77 mmHg. Eyes conjunctiva clear no eyelid edema noted. pupils equal round and  reactive to light and accommodation. Ears, Nose, Mouth, and Throat no gross abnormality of ear auricles or external auditory canals. normal hearing noted during conversation. mucus membranes moist. Respiratory normal breathing without difficulty. Cardiovascular 2+ dorsalis pedis/posterior tibialis pulses. 1+ pitting edema of the bilateral lower extremities. Musculoskeletal normal gait and posture. no significant deformity or arthritic changes, no loss or range of motion, no clubbing. TATIONA, STECH ([id number]) Psychiatric this patient is able to make decisions and demonstrates good insight into disease process. Alert and Oriented x 3. pleasant and cooperative. General Notes: Upon inspection patient's wound bed actually showed signs of having necrotic tissue on the surface of the wound she did have some hypergranulation as well in regard to the leg. I did perform debridement to clear away both the necrotic tissue as well as the hypergranular tissue and postdebridement the wound bed appears to be doing much better which is great news at both locations. Fortunately I think that she is definitely showing signs of healing already and I think with the appropriate treatment here including compression therapy I think she will actually do quite well. Integumentary (Hair, Skin) Wound #10 status is Open. Original cause of wound was Surgical Injury. The date acquired was: 12/05/2020. The wound is located on the Left,Dorsal Foot. The wound measures 0.5cm length x 0.3cm width x 2.6cm depth; 0.118cm^2 area and 0.306cm^3 volume. There is Fat Layer (Subcutaneous Tissue) exposed. There is no tunneling or undermining noted. There is a medium amount of serosanguineous drainage noted. There is no granulation within the wound bed. There is no necrotic tissue within the wound bed. Wound #9 status is Open. Original cause of wound was Trauma. The date acquired was: 02/04/2021. The wound is located on the Left Lower  Leg. The wound measures 1.7cm length x 1cm width x 0.1cm depth; 1.335cm^2 area and 0.134cm^3 volume. There is Fat Layer (Subcutaneous Tissue) exposed. There is no tunneling or undermining noted. There is a medium amount of serosanguineous drainage noted. There is large (67- 100%) red, pink, hyper - granulation within the wound bed. There is no necrotic tissue within the wound bed. Assessment Active Problems ICD-10 Type 2 diabetes mellitus with foot ulcer Non-pressure chronic ulcer of other part of left foot with fat layer exposed Laceration without foreign body, left lower leg, initial encounter Chronic kidney disease, stage 4 (severe) Chronic combined systolic (congestive) and diastolic (congestive) heart failure Essential (primary) hypertension Procedures Wound #10 Pre-procedure diagnosis of Wound #10 is a Dehisced Wound located on the Left,Dorsal Foot . There was a Excisional Skin/Subcutaneous Tissue Debridement with a total area of 1 sq cm performed by Tommie Sams., PA-C. With the following instrument(s): Curette to remove Viable and Non-Viable tissue/material. Material removed includes Subcutaneous Tissue and Slough and. A time out was conducted at 13:43, prior to the start of the procedure. A Minimum amount of bleeding was controlled with Pressure. The procedure was tolerated well. Post Debridement Measurements: 0.5cm length x 0.3cm width x 2.6cm depth; 0.306cm^3 volume. Character of Wound/Ulcer Post Debridement is stable. Post procedure Diagnosis Wound #10: Same as Pre-Procedure Wound #9 Pre-procedure diagnosis of Wound #9 is a Trauma, Other located on the Left Lower Leg . There was a Excisional Skin/Subcutaneous Tissue Debridement with a total area of 1.7 sq cm performed by Tommie Sams., PA-C. With the  following instrument(s): Curette to remove Viable and Non-Viable tissue/material. Material removed includes Subcutaneous Tissue and Slough and. A time out was conducted at 13:43,  prior to the start of the procedure. A Moderate amount of bleeding was controlled with Silver Nitrate. The procedure was tolerated well. Post Debridement Measurements: 1.7cm length x 1cm width x 0.2cm depth; 0.267cm^3 volume. Character of Wound/Ulcer Post Debridement is stable. Post procedure Diagnosis Wound #9: Same as Pre-Procedure General Notes: 1 silver nitrate stick used. Plan Follow-up Appointments: Return Appointment in 1 week. Home Health: [place]: - [place] for wound care. May utilize formulary equivalent dressing for wound treatment orders unless otherwise specified. Dante ([id number]) Health Nurse may visit PRN to address patient s wound care needs. Scheduled days for dressing changes to be completed; exception, patient has scheduled wound care visit that day. **Please direct any NON-WOUND related issues/requests for orders to patient's Primary Care Physician. **If current dressing causes regression in wound condition, may D/C ordered dressing product/s and apply Normal Saline Moist Dressing daily until next [facility] or Other MD appointment. **Notify [facility] of regression in wound condition at [phone]. Bathing/ Shower/ Hygiene: May shower; gently cleanse wound with antibacterial soap, rinse and pat dry prior to dressing wounds Medications-Please add to medication list.: P.O. Antibiotics - Start Augmentin The following medication(s) was prescribed: Augmentin oral 875 mg-125 mg tablet 1 1 tablet oral taken 2 times per day for 10 days starting 05/22/2021 WOUND #10: - Foot Wound Laterality: Dorsal, Left Cleanser: Normal Saline 3 x Per Week/30 Days Discharge Instructions: Wash your hands with soap and water. Remove old dressing, discard into plastic bag and place into trash. Cleanse the wound with Normal Saline prior to applying a clean dressing using gauze sponges, not tissues or cotton balls. Do not  scrub or use excessive force. Pat dry using gauze sponges, not tissue or cotton balls. Primary Dressing: Hydrofera Blue Classic Foam Rope Dressing, 9x6 (mm/in) 3 x Per Week/30 Days Discharge Instructions: Cut about 3 cm in length, cut in half-pack in wound and moisten with a couple drops of saline (extra sent with patient) Secondary Dressing: Mepilex Border Flex, 4x4 (in/in) 3 x Per Week/30 Days Discharge Instructions: Apply to wound as directed. Do not cut. WOUND #9: - Lower Leg Wound Laterality: Left Cleanser: Normal Saline 3 x Per Week/30 Days Discharge Instructions: Wash your hands with soap and water. Remove old dressing, discard into plastic bag and place into trash. Cleanse the wound with Normal Saline prior to applying a clean dressing using gauze sponges, not tissues or cotton balls. Do not scrub or use excessive force. Pat dry using gauze sponges, not tissue or cotton balls. Primary Dressing: Hydrofera Blue Ready Transfer Foam, 2.5x2.5 (in/in) 3 x Per Week/30 Days Discharge Instructions: Apply Hydrofera Blue Ready to wound bed as directed Secondary Dressing: Mepilex Border Flex, 4x4 (in/in) 3 x Per Week/30 Days Discharge Instructions: Apply to wound as directed. Do not cut. 1. Would recommend currently that we going to continue with wound care measures as before and the patient is in agreement with the plan. This includes the use of the [facility] dressing which we use to both locations. 2. Also can recommend that we have the patient continue with a border foam dressing to cover which actually I think will do quite well as well. 3. I am going to get her on Augmentin just due to how the wound appears and again for prevention type purposes  I think that this will be appropriate currently. 4. I am also can recommend that we continue with the elevation to ensure there is no swelling obviously right now she is not too swollen but that could change elevation will definitely be of  benefit for her. We will see patient back for reevaluation in 1 week here in the clinic. If anything worsens or changes patient will contact our office for additional recommendations. Electronic Signature(s) Signed: 05/24/2021 9:21:54 AM By: Worthy Keeler PA-C Entered By: Worthy Keeler on 05/24/2021 09:21:54 [name] ([id number]) -------------------------------------------------------------------------------- ROS/PFSH Details Patient Name: [name] Date of Service: 05/22/2021 12:45 PM Medical Record Number: [id number] Patient Account Number: [id number] Date of Birth/Sex: [birth year] (65 y.o. F) Treating RN: Dolan Amen Primary Care Provider: Elsie Stain Other Clinician: Referring Provider: Celesta Gentile Treating Provider/Extender: Skipper Cliche in Treatment: 0 Information Obtained From Patient Constitutional Symptoms (General Health) Complaints and Symptoms: Negative for: Fatigue; Fever; Chills; Marked Weight Change Eyes Complaints and Symptoms: Negative for: Dry Eyes; Vision Changes; Glasses / Contacts Medical History: Negative for: Cataracts; Glaucoma; Optic Neuritis Ear/Nose/Mouth/Throat Complaints and Symptoms: Negative for: Difficult clearing ears; Sinusitis Medical History: Negative for: Chronic sinus problems/congestion; Middle ear problems Hematologic/Lymphatic Complaints and Symptoms: Negative for: Bleeding / Clotting Disorders; Human Immunodeficiency Virus Medical History: Positive for: Anemia Negative for: Hemophilia; Human Immunodeficiency Virus; Lymphedema; Sickle Cell Disease Respiratory Complaints and Symptoms: Negative for: Chronic or frequent coughs; Shortness of Breath Medical History: Negative for: Aspiration; Asthma; Chronic Obstructive Pulmonary Disease (COPD); Pneumothorax; Sleep Apnea; Tuberculosis Cardiovascular Complaints and Symptoms: Negative for: Chest pain; LE edema Medical History: Positive for: Arrhythmia;  Congestive Heart Failure; Hypertension; Myocardial Infarction Negative for: Hypotension; Peripheral Arterial Disease; Peripheral Venous Disease; Phlebitis; Vasculitis Past Medical History Notes: pacemaker, defibilator Gastrointestinal Complaints and Symptoms: Negative for: Frequent diarrhea; Nausea; Vomiting Medical History: Negative for: Cirrhosis ; Colitis; Crohnos; Hepatitis A; Hepatitis B; Hepatitis C Endocrine SHUNDA, RABADI. ([id number]) Complaints and Symptoms: Negative for: Hepatitis; Thyroid disease; Polydypsia (Excessive Thirst) Medical History: Positive for: Type II Diabetes Time with diabetes: over 30 years Treated with: Insulin Blood sugar tested every day: Yes Tested : Genitourinary Complaints and Symptoms: Negative for: Kidney failure/ Dialysis; Incontinence/dribbling Medical History: Positive for: End Stage Renal Disease - stage 4 kidney failure Immunological Complaints and Symptoms: Negative for: Hives; Itching Medical History: Negative for: Lupus Erythematosus; Raynaudos; Scleroderma Integumentary (Skin) Complaints and Symptoms: Positive for: Wounds; Swelling Negative for: Bleeding or bruising tendency; Breakdown Medical History: Negative for: History of Burn; History of pressure wounds Musculoskeletal Complaints and Symptoms: Negative for: Muscle Pain; Muscle Weakness Medical History: Positive for: Osteoarthritis; Osteomyelitis Negative for: Gout; Rheumatoid Arthritis Neurologic Complaints and Symptoms: Negative for: Numbness/parasthesias; Focal/Weakness Medical History: Positive for: Neuropathy; Seizure Disorder Negative for: Dementia; Quadriplegia; Paraplegia Psychiatric Complaints and Symptoms: Negative for: Anxiety; Claustrophobia Medical History: Negative for: Anorexia/bulimia; Confinement Anxiety Oncologic Medical History: Negative for: Received Chemotherapy; Received Radiation Immunizations Pneumococcal Vaccine: Received  Pneumococcal Vaccination: Yes Received Pneumococcal Vaccination On or After 60th Birthday: Yes Immunization Notes: NAUTIKA, CRESSEY ([id number]) patient don't remember Implantable Devices Yes Family and Social History Cancer: Yes - Father; Diabetes: Yes - Paternal Grandparents; Heart Disease: Yes - Maternal Grandparents; Hereditary Spherocytosis: No; Hypertension: No; Kidney Disease: No; Lung Disease: No; Seizures: No; Stroke: No; Thyroid Problems: No; Tuberculosis: No; Never smoker; Marital Status - Widowed; Alcohol Use: Never; Drug Use: No History; Caffeine Use: Daily - coffee; Financial Concerns: No; Food, Clothing or Shelter Needs: No; Support System Lacking: No; Transportation Concerns:  No Electronic Signature(s) Signed: 05/22/2021 4:36:48 PM By: Dolan Amen RN Signed: 05/24/2021 4:48:07 PM By: Worthy Keeler PA-C Entered By: Dolan Amen on 05/22/2021 12:58:54 [name] ([id number]) -------------------------------------------------------------------------------- SuperBill Details Patient Name: [name] Date of Service: 05/22/2021 Medical Record Number: [id number] Patient Account Number: [id number] Date of Birth/Sex: September 09, 1956 (65 y.o. 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 S81.812A Laceration without foreign body, left lower leg, initial encounter N18.4 Chronic kidney disease, stage 4 (severe) I50.42 Chronic combined systolic (congestive) and diastolic (congestive) heart failure I10 Essential (primary) hypertension Facility Procedures CPT4 Code: 60737106 Description: [place] VISIT-LEV 3 EST PT Modifier: Quantity:  1 CPT4 Code: 26948546 Description: 11042 - DEB SUBQ TISSUE 20 SQ CM/< Modifier: Quantity: 1 CPT4 Code: Description: ICD-10 Diagnosis Description S81.812A Laceration without foreign body, left lower leg, initial encounter L97.522 Non-pressure chronic ulcer of other part of left foot with fat layer expo Modifier: sed Quantity: Physician Procedures CPT4 Code: 2703500 Description: 93818 - WC PHYS LEVEL 4 - NEW PT Modifier: 25 Quantity: 1 CPT4 Code: Description: ICD-10 Diagnosis Description E11.621 Type 2 diabetes mellitus with foot ulcer L97.522 Non-pressure chronic ulcer of other part of left foot with fat layer exp E99.371I Laceration without foreign body, left lower leg, initial encounter N18.4  Chronic kidney disease, stage 4 (severe) Modifier: osed Quantity: CPT4 Code: 9678938 Description: 11042 - WC PHYS SUBQ TISS 20 SQ CM Modifier: Quantity: 1 CPT4 Code: Description: ICD-10 Diagnosis Description S81.812A Laceration without foreign body, left lower leg, initial encounter 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

## 2021-05-29 ENCOUNTER — Telehealth: Payer: Self-pay

## 2021-05-29 NOTE — Telephone Encounter (Signed)
Attempted call to epic phone number listed for patient and non working number, [phone].  Was attempting to reach patient to request to send remote transmission to recheck fluid levels per HF clinic request after 05/21/2021.  Unable to reach patient.    Will inform Allena Katz, NP at HF clinic was not successful in reaching patient to request remote transmission.  Patient's carelink home monitor has been disconnected since Mar 05, 2021 and unable to schedule an automatic report.

## 2021-05-31 ENCOUNTER — Encounter: Payer: HMO | Admitting: Physician Assistant

## 2021-05-31 ENCOUNTER — Other Ambulatory Visit: Payer: Self-pay

## 2021-05-31 DIAGNOSIS — T8189XA Other complications of procedures, not elsewhere classified, initial encounter: Secondary | ICD-10-CM | POA: Diagnosis not present

## 2021-05-31 DIAGNOSIS — L97822 Non-pressure chronic ulcer of other part of left lower leg with fat layer exposed: Secondary | ICD-10-CM | POA: Diagnosis not present

## 2021-05-31 DIAGNOSIS — T8130XA Disruption of wound, unspecified, initial encounter: Secondary | ICD-10-CM | POA: Diagnosis not present

## 2021-05-31 NOTE — Progress Notes (Signed)
Medical Record Number: [id number] Patient Account Number: [id number] Date of Birth/Sex: 1956/09/12 (65 y.o. F) Treating RN: Dolan Amen Primary Care Biddie Sebek: Elsie Stain Other Clinician: Referring Xavia Kniskern: Elsie Stain Treating Avika Carbine/Extender: Skipper Cliche in Treatment: 1 Active Inactive Necrotic Tissue Nursing Diagnoses: Impaired tissue integrity related to necrotic/devitalized tissue Goals: Necrotic/devitalized tissue will be minimized in the wound bed Date Initiated: 05/22/2021 Target Resolution Date: 05/22/2021 Goal Status: Active Patient/caregiver will verbalize understanding of reason and process for debridement of necrotic tissue Date Initiated: 05/22/2021 Target Resolution Date: 05/22/2021 Goal Status: Active Interventions: Assess patient pain level pre-, during and post procedure and prior to discharge Provide education on necrotic tissue and debridement process Treatment Activities: Apply topical anesthetic as ordered : 05/22/2021 Biologic debridement : 05/22/2021 Enzymatic debridement : 05/22/2021 Excisional debridement : 05/22/2021 Notes: Wound/Skin Impairment Nursing Diagnoses: Impaired tissue integrity Goals: Patient/caregiver will verbalize understanding of skin care  regimen Date Initiated: 05/22/2021 Target Resolution Date: 05/22/2021 Goal Status: Active Ulcer/skin breakdown will have a volume reduction of 30% by week 4 Date Initiated: 05/22/2021 Target Resolution Date: 06/22/2021 Goal Status: Active Ulcer/skin breakdown will have a volume reduction of 50% by week 8 Date Initiated: 05/22/2021 Target Resolution Date: 07/22/2021 Goal Status: Active Ulcer/skin breakdown will have a volume reduction of 80% by week 12 Date Initiated: 05/22/2021 Target Resolution Date: 08/22/2021 Goal Status: Active Ulcer/skin breakdown will heal within 14 weeks Date Initiated: 05/22/2021 Target Resolution Date: 09/21/2021 Goal Status: Active Interventions: Assess patient/caregiver ability to obtain necessary supplies Assess patient/caregiver ability to perform ulcer/skin care regimen upon admission and as needed Assess ulceration(s) every visit Provide education on ulcer and skin care [name], [name] ([id number]) Treatment Activities: Referred to DME Natanya Holecek for dressing supplies : 05/22/2021 Skin care regimen initiated : 05/22/2021 Notes: Electronic Signature(s) Signed: 05/31/2021 3:34:58 PM By: Dolan Amen RN Entered By: Dolan Amen on 05/31/2021 14:36:48 [name] ([id number]) -------------------------------------------------------------------------------- Pain Assessment Details Patient Name: [name] Date of Service: 05/31/2021 2:00 PM Medical Record Number: [id number] Patient Account Number: [id number] Date of Birth/Sex: 12-01-1955 (65 y.o. F) Treating RN: Dolan Amen Primary Care Berthel Bagnall: Elsie Stain Other Clinician: Referring Jenicka Coxe: Elsie Stain Treating Ed Rayson/Extender: Skipper Cliche in Treatment: 1 Active Problems Location of Pain Severity and Description of Pain Patient Has Paino No Site Locations Rate the pain. Current Pain Level: 0 Pain Management and Medication Current Pain Management: Electronic  Signature(s) Signed: 05/31/2021 3:34:58 PM By: Dolan Amen RN Entered By: Dolan Amen on 05/31/2021 14:24:23 [name] ([id number]) -------------------------------------------------------------------------------- Patient/Caregiver Education Details Patient Name: [name] Date of Service: 05/31/2021 2:00 PM Medical Record Number: [id number] Patient Account Number: [id number] Date of Birth/Gender: 01-28-1956 (65 y.o. F) Treating RN: Dolan Amen Primary Care Physician: Elsie Stain Other Clinician: Referring Physician: Elsie Stain Treating Physician/Extender: Skipper Cliche in Treatment: 1 Education Assessment Education Provided To: Patient Education Topics Provided Wound Debridement: Methods: Explain/Verbal Responses: State content correctly Wound/Skin Impairment: Methods: Explain/Verbal Responses: State content correctly Electronic Signature(s) Signed: 05/31/2021 3:34:58 PM By: Dolan Amen RN Entered By: Dolan Amen on 05/31/2021 14:49:33 [name] ([id number]) -------------------------------------------------------------------------------- Wound Assessment Details Patient Name: [name] Date of Service: 05/31/2021 2:00 PM Medical Record Number: [id number] Patient Account Number: [id number] Date of Birth/Sex: May 03, 1956 (65 y.o. F) Treating RN: Dolan Amen Primary Care Cael Worth: Elsie Stain Other Clinician: Referring Lavayah Vita: Elsie Stain Treating Abiha Lukehart/Extender: Jeri Cos Weeks in Treatment: 1 Wound Status Wound Number: 10 Primary Dehisced Wound Etiology: Wound Location: Left, Dorsal Foot  Medical Record Number: [id number] Patient Account Number: [id number] Date of Birth/Sex: 1956/09/12 (65 y.o. F) Treating RN: Dolan Amen Primary Care Biddie Sebek: Elsie Stain Other Clinician: Referring Xavia Kniskern: Elsie Stain Treating Avika Carbine/Extender: Skipper Cliche in Treatment: 1 Active Inactive Necrotic Tissue Nursing Diagnoses: Impaired tissue integrity related to necrotic/devitalized tissue Goals: Necrotic/devitalized tissue will be minimized in the wound bed Date Initiated: 05/22/2021 Target Resolution Date: 05/22/2021 Goal Status: Active Patient/caregiver will verbalize understanding of reason and process for debridement of necrotic tissue Date Initiated: 05/22/2021 Target Resolution Date: 05/22/2021 Goal Status: Active Interventions: Assess patient pain level pre-, during and post procedure and prior to discharge Provide education on necrotic tissue and debridement process Treatment Activities: Apply topical anesthetic as ordered : 05/22/2021 Biologic debridement : 05/22/2021 Enzymatic debridement : 05/22/2021 Excisional debridement : 05/22/2021 Notes: Wound/Skin Impairment Nursing Diagnoses: Impaired tissue integrity Goals: Patient/caregiver will verbalize understanding of skin care  regimen Date Initiated: 05/22/2021 Target Resolution Date: 05/22/2021 Goal Status: Active Ulcer/skin breakdown will have a volume reduction of 30% by week 4 Date Initiated: 05/22/2021 Target Resolution Date: 06/22/2021 Goal Status: Active Ulcer/skin breakdown will have a volume reduction of 50% by week 8 Date Initiated: 05/22/2021 Target Resolution Date: 07/22/2021 Goal Status: Active Ulcer/skin breakdown will have a volume reduction of 80% by week 12 Date Initiated: 05/22/2021 Target Resolution Date: 08/22/2021 Goal Status: Active Ulcer/skin breakdown will heal within 14 weeks Date Initiated: 05/22/2021 Target Resolution Date: 09/21/2021 Goal Status: Active Interventions: Assess patient/caregiver ability to obtain necessary supplies Assess patient/caregiver ability to perform ulcer/skin care regimen upon admission and as needed Assess ulceration(s) every visit Provide education on ulcer and skin care [name], [name] ([id number]) Treatment Activities: Referred to DME Natanya Holecek for dressing supplies : 05/22/2021 Skin care regimen initiated : 05/22/2021 Notes: Electronic Signature(s) Signed: 05/31/2021 3:34:58 PM By: Dolan Amen RN Entered By: Dolan Amen on 05/31/2021 14:36:48 [name] ([id number]) -------------------------------------------------------------------------------- Pain Assessment Details Patient Name: [name] Date of Service: 05/31/2021 2:00 PM Medical Record Number: [id number] Patient Account Number: [id number] Date of Birth/Sex: 12-01-1955 (65 y.o. F) Treating RN: Dolan Amen Primary Care Berthel Bagnall: Elsie Stain Other Clinician: Referring Jenicka Coxe: Elsie Stain Treating Ed Rayson/Extender: Skipper Cliche in Treatment: 1 Active Problems Location of Pain Severity and Description of Pain Patient Has Paino No Site Locations Rate the pain. Current Pain Level: 0 Pain Management and Medication Current Pain Management: Electronic  Signature(s) Signed: 05/31/2021 3:34:58 PM By: Dolan Amen RN Entered By: Dolan Amen on 05/31/2021 14:24:23 [name] ([id number]) -------------------------------------------------------------------------------- Patient/Caregiver Education Details Patient Name: [name] Date of Service: 05/31/2021 2:00 PM Medical Record Number: [id number] Patient Account Number: [id number] Date of Birth/Gender: 01-28-1956 (65 y.o. F) Treating RN: Dolan Amen Primary Care Physician: Elsie Stain Other Clinician: Referring Physician: Elsie Stain Treating Physician/Extender: Skipper Cliche in Treatment: 1 Education Assessment Education Provided To: Patient Education Topics Provided Wound Debridement: Methods: Explain/Verbal Responses: State content correctly Wound/Skin Impairment: Methods: Explain/Verbal Responses: State content correctly Electronic Signature(s) Signed: 05/31/2021 3:34:58 PM By: Dolan Amen RN Entered By: Dolan Amen on 05/31/2021 14:49:33 [name] ([id number]) -------------------------------------------------------------------------------- Wound Assessment Details Patient Name: [name] Date of Service: 05/31/2021 2:00 PM Medical Record Number: [id number] Patient Account Number: [id number] Date of Birth/Sex: May 03, 1956 (65 y.o. F) Treating RN: Dolan Amen Primary Care Cael Worth: Elsie Stain Other Clinician: Referring Lavayah Vita: Elsie Stain Treating Abiha Lukehart/Extender: Jeri Cos Weeks in Treatment: 1 Wound Status Wound Number: 10 Primary Dehisced Wound Etiology: Wound Location: Left, Dorsal Foot  [name], [name] ([id number]) Visit Report for 05/31/2021 Arrival Information Details Patient Name: [name], [name] Date of Service: 05/31/2021 2:00 PM Medical Record Number: [id number] Patient Account Number: [id number] Date of Birth/Sex: 08/10/1956 (65 y.o. 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 F) Treating RN: Dolan Amen Primary Care Arriel Victor: Elsie Stain Other Clinician: Referring Deyonte Cadden: Elsie Stain Treating Deb Loudin/Extender: Skipper Cliche in Treatment: 1 Clinic Level of Care Assessment Items TOOL 4 Quantity Score X - Use when only an EandM is performed on FOLLOW-UP visit 1 0 ASSESSMENTS - Nursing Assessment / Reassessment X - Reassessment of Co-morbidities (includes updates in patient status) 1 10 X- 1 5 Reassessment of Adherence to Treatment Plan ASSESSMENTS - Wound and Skin Assessment / Reassessment []  - Simple Wound Assessment / Reassessment - one wound 0 X- 2 5 Complex Wound Assessment / Reassessment - multiple wounds []  - 0 Dermatologic / Skin Assessment (not related to wound  area) ASSESSMENTS - Focused Assessment []  - Circumferential Edema Measurements - multi extremities 0 []  - 0 Nutritional Assessment / Counseling / Intervention []  - 0 Lower Extremity Assessment (monofilament, tuning fork, pulses) []  - 0 Peripheral Arterial Disease Assessment (using hand held doppler) ASSESSMENTS - Ostomy and/or Continence Assessment and Care []  - Incontinence Assessment and Management 0 []  - 0 Ostomy Care Assessment and Management (repouching, etc.) PROCESS - Coordination of Care X - Simple Patient / Family Education for ongoing care 1 15 []  - 0 Complex (extensive) Patient / Family Education for ongoing care []  - 0 Staff obtains Programmer, systems, Records, Test Results / Process Orders []  - 0 Staff telephones HHA, Nursing Homes / Clarify orders / etc []  - 0 Routine Transfer to another Facility (non-emergent condition) []  - 0 Routine Hospital Admission (non-emergent condition) []  - 0 New Admissions / Biomedical engineer / Ordering NPWT, Apligraf, etc. []  - 0 Emergency Hospital Admission (emergent condition) X- 1 10 Simple Discharge Coordination []  - 0 Complex (extensive) Discharge Coordination PROCESS - Special Needs []  - Pediatric / Minor Patient Management 0 []  - 0 Isolation Patient Management []  - 0 Hearing / Language / Visual special needs []  - 0 Assessment of Community assistance (transportation, D/C planning, etc.) []  - 0 Additional assistance / Altered mentation []  - 0 Support Surface(s) Assessment (bed, cushion, seat, etc.) INTERVENTIONS - Wound Cleansing / Measurement [name], [name] ([id number]) []  - 0 Simple Wound Cleansing - one wound X- 2 5 Complex Wound Cleansing - multiple wounds X- 1 5 Wound Imaging (photographs - any number of wounds) []  - 0 Wound Tracing (instead of photographs) []  - 0 Simple Wound Measurement - one wound X- 2 5 Complex Wound Measurement - multiple wounds INTERVENTIONS - Wound Dressings []  - Small Wound  Dressing one or multiple wounds 0 X- 1 15 Medium Wound Dressing one or multiple wounds []  - 0 Large Wound Dressing one or  [name], [name] ([id number]) Visit Report for 05/31/2021 Arrival Information Details Patient Name: [name], [name] Date of Service: 05/31/2021 2:00 PM Medical Record Number: [id number] Patient Account Number: [id number] Date of Birth/Sex: 08/10/1956 (65 y.o. 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 F) Treating RN: Dolan Amen Primary Care Arriel Victor: Elsie Stain Other Clinician: Referring Deyonte Cadden: Elsie Stain Treating Deb Loudin/Extender: Skipper Cliche in Treatment: 1 Clinic Level of Care Assessment Items TOOL 4 Quantity Score X - Use when only an EandM is performed on FOLLOW-UP visit 1 0 ASSESSMENTS - Nursing Assessment / Reassessment X - Reassessment of Co-morbidities (includes updates in patient status) 1 10 X- 1 5 Reassessment of Adherence to Treatment Plan ASSESSMENTS - Wound and Skin Assessment / Reassessment []  - Simple Wound Assessment / Reassessment - one wound 0 X- 2 5 Complex Wound Assessment / Reassessment - multiple wounds []  - 0 Dermatologic / Skin Assessment (not related to wound  area) ASSESSMENTS - Focused Assessment []  - Circumferential Edema Measurements - multi extremities 0 []  - 0 Nutritional Assessment / Counseling / Intervention []  - 0 Lower Extremity Assessment (monofilament, tuning fork, pulses) []  - 0 Peripheral Arterial Disease Assessment (using hand held doppler) ASSESSMENTS - Ostomy and/or Continence Assessment and Care []  - Incontinence Assessment and Management 0 []  - 0 Ostomy Care Assessment and Management (repouching, etc.) PROCESS - Coordination of Care X - Simple Patient / Family Education for ongoing care 1 15 []  - 0 Complex (extensive) Patient / Family Education for ongoing care []  - 0 Staff obtains Programmer, systems, Records, Test Results / Process Orders []  - 0 Staff telephones HHA, Nursing Homes / Clarify orders / etc []  - 0 Routine Transfer to another Facility (non-emergent condition) []  - 0 Routine Hospital Admission (non-emergent condition) []  - 0 New Admissions / Biomedical engineer / Ordering NPWT, Apligraf, etc. []  - 0 Emergency Hospital Admission (emergent condition) X- 1 10 Simple Discharge Coordination []  - 0 Complex (extensive) Discharge Coordination PROCESS - Special Needs []  - Pediatric / Minor Patient Management 0 []  - 0 Isolation Patient Management []  - 0 Hearing / Language / Visual special needs []  - 0 Assessment of Community assistance (transportation, D/C planning, etc.) []  - 0 Additional assistance / Altered mentation []  - 0 Support Surface(s) Assessment (bed, cushion, seat, etc.) INTERVENTIONS - Wound Cleansing / Measurement [name], [name] ([id number]) []  - 0 Simple Wound Cleansing - one wound X- 2 5 Complex Wound Cleansing - multiple wounds X- 1 5 Wound Imaging (photographs - any number of wounds) []  - 0 Wound Tracing (instead of photographs) []  - 0 Simple Wound Measurement - one wound X- 2 5 Complex Wound Measurement - multiple wounds INTERVENTIONS - Wound Dressings []  - Small Wound  Dressing one or multiple wounds 0 X- 1 15 Medium Wound Dressing one or multiple wounds []  - 0 Large Wound Dressing one or

## 2021-05-31 NOTE — Progress Notes (Addendum)
JAYME, MEDNICK ([id number]) Visit Report for 05/31/2021 Chief Complaint Document Details Patient Name: [name], [name] Date of Service: 05/31/2021 2:00 PM Medical Record Number: [id number] Patient Account Number: [id number] Date of Birth/Sex: 08-29-1956 (65 y.o. F) Treating RN: Dolan Amen Primary Care Provider: Elsie Stain Other Clinician: Referring Provider: Elsie Stain Treating Provider/Extender: Skipper Cliche in Treatment: 1 Information Obtained from: Patient Chief Complaint Left LE and Left Foot Ulcer Electronic Signature(s) Signed: 05/31/2021 2:32:34 PM By: Worthy Keeler PA-C Entered By: Worthy Keeler on 05/31/2021 14:32:34 [name] ([id number]) -------------------------------------------------------------------------------- HPI Details Patient Name: [name] Date of Service: 05/31/2021 2:00 PM Medical Record Number: [id number] Patient Account Number: [id number] Date of Birth/Sex: 1956/07/26 (65 y.o. F) Treating RN: Dolan Amen Primary Care Provider: Elsie Stain Other Clinician: Referring Provider: Elsie Stain Treating Provider/Extender: Skipper Cliche in Treatment: 1 History of Present Illness HPI Description: 65 year old patient who is known to have diabetes for several years and generally has it under good control last hemoglobin A1c was 6.7 done 2 weeks ago. She has several other comorbidities including acute CHF, hypoxia, hyperlipidemia, systolic heart failure, myocardial infarction, coronary artery disease, iron deficiency anemia, renal insufficiency, essential hypertension, morbid obesity and obstructive sleep apnea. 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 In the past she's had a coronary angioplasty with stent in 2013, laparoscopic cholecystectomy in 2011, bilateral oophorectomy in April 2012 and she's had some lumbar disc surgery in 1996. She says she's had a lot of  peripheral neuropathy and due to this she's had a ulcer on her left second toe for a while. She had a dry eschar on her left heel and recently this was removed and she found that she had some infection and some drainage from there. She has minimal pain around the heel but no pain on any areas of her toes. she has not been on any antibiotics recently and has known vascular problems in the recent past. Readmission: 05/22/2021 upon evaluation today patient appears to be doing somewhat poorly in regard to the wound that is present on her left lateral lower leg as well as the left dorsal foot. The foot is very minimal and seems to be doing decently well the leg is a little bit worse. The foot is been present since about March 1 and the leg since about May 1. Subsequently the patient tells me that this is due to a trauma. She does have diabetes. With that being said she also has chronic kidney disease stage IV, congestive heart failure, and hypertension. She tells me that this is actually gotten significantly better since the initial injury. With that being said there still is a significant issue here that we do need to try to get under control. The patient voiced understanding. Nonetheless I do believe that this is going to require regular wound care here in the clinic with [place]. Patient's wounds are going also require some sharp debridement to clear away some of the necrotic tissue from both locations and I think this is absolutely appropriate based on what we are seeing today I think it would also help with getting things moving in a much better direction in general. 05/31/2021 upon evaluation today patient actually appears to be doing excellent in regard to her wounds. I am actually very pleased with where things stand today. There is no signs of active infection at this time. No fevers, chills, nausea, vomiting, or diarrhea. Electronic Signature(s) Signed: 05/31/2021 2:41:20  PM By: Worthy Keeler  PA-C Entered By: Worthy Keeler on 05/31/2021 14:41:20 [name] ([id number]) -------------------------------------------------------------------------------- Physical Exam Details Patient Name: [name] Date of Service: 05/31/2021 2:00 PM Medical Record Number: [id number] Patient Account Number: [id number] Date of Birth/Sex: 07-20-1956 (65 y.o. F) Treating RN: Dolan Amen Primary Care Provider: Elsie Stain Other Clinician: Referring Provider: Elsie Stain Treating Provider/Extender: Jeri Cos Weeks in Treatment: 1 Constitutional Well-nourished and well-hydrated in no acute distress. Respiratory normal breathing without difficulty. Psychiatric this patient is able to make decisions and demonstrates good insight into disease process. Alert and Oriented x 3. pleasant and cooperative. Notes Upon inspection patient's wound bed showed signs of good granulation and epithelization at this point. Fortunately there does not appear to be any signs of infection at all which is great and overall I am extremely pleased with where things stand. No fevers, chills, nausea, vomiting, or diarrhea. Electronic Signature(s) Signed: 05/31/2021 2:41:40 PM By: Worthy Keeler PA-C Entered By: Worthy Keeler on 05/31/2021 14:41:40 [name] ([id number]) -------------------------------------------------------------------------------- Physician Orders Details Patient Name: [name] Date of Service: 05/31/2021 2:00 PM Medical Record Number: [id number] Patient Account Number: [id number] Date of Birth/Sex: April 11, 1956 (65 y.o. F) Treating RN: Dolan Amen Primary Care Provider: Elsie Stain Other Clinician: Referring Provider: Elsie Stain Treating Provider/Extender: Skipper Cliche in Treatment: 1 Verbal / Phone Orders: No Diagnosis Coding ICD-10 Coding Code Description E11.621 Type 2 diabetes mellitus with foot ulcer L97.522 Non-pressure chronic ulcer  of other part of left foot with fat layer exposed S81.812A Laceration without foreign body, left lower leg, initial encounter N18.4 Chronic kidney disease, stage 4 (severe) I50.42 Chronic combined systolic (congestive) and diastolic (congestive) heart failure I10 Essential (primary) hypertension Follow-up Appointments o Return Appointment in 1 week. [facility] for wound care. May utilize formulary equivalent dressing for wound treatment orders unless otherwise specified. Home Health Nurse may visit [facility] to address patientos wound care needs. o Scheduled days for dressing changes to be completed; exception, patient has scheduled wound care visit that day. o **Please direct any NON-WOUND related issues/requests for orders to patient's Primary Care Physician. **If current dressing causes regression in wound condition, may D/C ordered dressing product/s and apply Normal Saline Moist Dressing daily until next [place] or Other MD appointment. **Notify [facility] of regression in wound condition at [phone]. Bathing/ Shower/ Hygiene o May shower; gently cleanse wound with antibacterial soap, rinse and pat dry prior to dressing wounds Medications-Please add to medication list. o P.O. Antibiotics - Start Augmentin Wound Treatment Wound #10 - Foot Wound Laterality: Dorsal, Left Cleanser: Normal Saline 3 x Per Week/30 Days Discharge Instructions: Wash your hands with soap and water. Remove old dressing, discard into plastic bag and place into trash. Cleanse the wound with Normal Saline prior to applying a clean dressing using gauze sponges, not tissues or cotton balls. Do not scrub or use excessive force. Pat dry using gauze sponges, not tissue or cotton balls. Primary Dressing: Hydrofera Blue Classic Foam Rope Dressing, 9x6 (mm/in) 3 x Per Week/30 Days Discharge Instructions: Cut about 3 cm in length, cut  in half or 4ths if needed-pack in wound and moisten with a couple drops of saline AFTER hydrofera is in (extra sent with patient) Secondary Dressing: Mepilex Border Flex, 4x4 (in/in) 3 x Per Week/30 Days Discharge Instructions: Apply to wound as directed. Do not cut. Wound #9 - Lower Leg Wound  Laterality: Left Cleanser: Normal Saline 3 x Per Week/30 Days Discharge Instructions: Wash your hands with soap and water. Remove old dressing, discard into plastic bag and place into trash. Cleanse the wound with Normal Saline prior to applying a clean dressing using gauze sponges, not tissues or cotton balls. Do not scrub or use excessive force. Pat dry using gauze sponges, not tissue or cotton balls. Primary Dressing: Hydrofera Blue Ready Transfer Foam, 2.5x2.5 (in/in) 3 x Per Week/30 Days Discharge Instructions: Apply Hydrofera Blue Ready to wound bed as directed Secondary Dressing: Mepilex Border Flex, 4x4 (in/in) 3 x Per Week/30 Days Discharge Instructions: Apply to wound as directed. Do not cut. KEELIN, NEVILLE ([id number]) Electronic Signature(s) Signed: 05/31/2021 3:34:58 PM By: Dolan Amen RN Signed: 05/31/2021 3:56:40 PM By: Worthy Keeler PA-C Entered By: Dolan Amen on 05/31/2021 14:40:39 [name] ([id number]) -------------------------------------------------------------------------------- Problem List Details Patient Name: [name] Date of Service: 05/31/2021 2:00 PM Medical Record Number: [id number] Patient Account Number: [id number] Date of Birth/Sex: 10-07-1956 (65 y.o. F) Treating RN: Dolan Amen Primary Care Provider: Elsie Stain Other Clinician: Referring Provider: Elsie Stain Treating Provider/Extender: Skipper Cliche in Treatment: 1 Active Problems ICD-10 Encounter Code Description Active Date MDM Diagnosis E11.621 Type 2 diabetes mellitus with foot ulcer 05/22/2021 No Yes L97.522 Non-pressure chronic ulcer of other part of left  foot with fat layer 05/22/2021 No Yes exposed S81.812A Laceration without foreign body, left lower leg, initial encounter 05/22/2021 No Yes N18.4 Chronic kidney disease, stage 4 (severe) 05/22/2021 No Yes I50.42 Chronic combined systolic (congestive) and diastolic (congestive) heart 05/22/2021 No Yes failure I10 Essential (primary) hypertension 05/22/2021 No Yes Inactive Problems Resolved Problems Electronic Signature(s) Signed: 05/31/2021 2:32:20 PM By: Worthy Keeler PA-C Entered By: Worthy Keeler on 05/31/2021 14:32:20 [name] ([id number]) -------------------------------------------------------------------------------- Progress Note Details Patient Name: [name] Date of Service: 05/31/2021 2:00 PM Medical Record Number: [id number] Patient Account Number: [id number] Date of Birth/Sex: September 28, 1956 (65 y.o. F) Treating RN: Dolan Amen Primary Care Provider: Elsie Stain Other Clinician: Referring Provider: Elsie Stain Treating Provider/Extender: Skipper Cliche in Treatment: 1 Subjective Chief Complaint Information obtained from Patient Left LE and Left Foot Ulcer History of Present Illness (HPI) 65 year old patient who is known to have diabetes for several years and generally has it under good control last hemoglobin A1c was 6.7 done 2 weeks ago. She has several other comorbidities including acute CHF, hypoxia, hyperlipidemia, systolic heart failure, myocardial infarction, coronary artery disease, iron deficiency anemia, renal insufficiency, essential hypertension, morbid obesity and obstructive sleep apnea. 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 In the past she's had a coronary angioplasty with stent in 2013, laparoscopic cholecystectomy in 2011, bilateral oophorectomy in April 2012 and she's had some lumbar disc surgery in 1996. She says she's had a lot of peripheral neuropathy and due to this she's  had a ulcer on her left second toe for a while. She had a dry eschar on her left heel and recently this was removed and she found that she had some infection and some drainage from there. She has minimal pain around the heel but no pain on any areas of her toes. she has not been on any antibiotics recently and has known vascular problems in the recent past. Readmission: 05/22/2021 upon evaluation today patient appears to be doing somewhat poorly in regard to the wound that is present on her left lateral lower leg as well as  the left dorsal foot. The foot is very minimal and seems to be doing decently well the leg is a little bit worse. The foot is been present since about March 1 and the leg since about May 1. Subsequently the patient tells me that this is due to a trauma. She does have diabetes. With that being said she also has chronic kidney disease stage IV, congestive heart failure, and hypertension. She tells me that this is actually gotten significantly better since the initial injury. With that being said there still is a significant issue here that we do need to try to get under control. The patient voiced understanding. Nonetheless I do believe that this is going to require regular wound care here in the clinic with [place]. Patient's wounds are going also require some sharp debridement to clear away some of the necrotic tissue from both locations and I think this is absolutely appropriate based on what we are seeing today I think it would also help with getting things moving in a much better direction in general. 05/31/2021 upon evaluation today patient actually appears to be doing excellent in regard to her wounds. I am actually very pleased with where things stand today. There is no signs of active infection at this time. No fevers, chills, nausea, vomiting, or diarrhea. Objective Constitutional Well-nourished and well-hydrated in no acute distress. Vitals Time Taken: 2:24 PM, Height: 68 in,  Weight: 278 lbs, BMI: 42.3, Temperature: 98.0 F, Pulse: 63 bpm, Respiratory Rate: 18 breaths/min, Blood Pressure: 94/58 mmHg. Respiratory normal breathing without difficulty. Psychiatric this patient is able to make decisions and demonstrates good insight into disease process. Alert and Oriented x 3. pleasant and cooperative. General Notes: Upon inspection patient's wound bed showed signs of good granulation and epithelization at this point. Fortunately there does not appear to be any signs of infection at all which is great and overall I am extremely pleased with where things stand. No fevers, chills, nausea, vomiting, or diarrhea. Integumentary (Hair, Skin) Wound #10 status is Open. Original cause of wound was Surgical Injury. The date acquired was: 12/05/2020. The wound has been in treatment 1 weeks. The wound is located on the Left,Dorsal Foot. The wound measures 0.4cm length x 0.2cm width x 2.8cm depth; 0.063cm^2 area and 0.176cm^3 volume. There is Fat Layer (Subcutaneous Tissue) exposed. There is no tunneling or undermining noted. There is a medium amount of serosanguineous drainage noted. There is no granulation within the wound bed. There is no necrotic tissue within the wound bed. Wound #9 status is Open. Original cause of wound was Trauma. The date acquired was: 02/04/2021. The wound has been in treatment 1 weeks. ESHAL, PROPPS ([id number]) The wound is located on the Left Lower Leg. The wound measures 1.3cm length x 0.8cm width x 0.1cm depth; 0.817cm^2 area and 0.082cm^3 volume. There is Fat Layer (Subcutaneous Tissue) exposed. There is no tunneling or undermining noted. There is a medium amount of serosanguineous drainage noted. The wound margin is flat and intact. There is large (67-100%) red, hyper - granulation within the wound bed. There is no necrotic tissue within the wound bed. Assessment Active Problems ICD-10 Type 2 diabetes mellitus with foot ulcer Non-pressure  chronic ulcer of other part of left foot with fat layer exposed Laceration without foreign body, left lower leg, initial encounter Chronic kidney disease, stage 4 (severe) Chronic combined systolic (congestive) and diastolic (congestive) heart failure Essential (primary) hypertension Plan Follow-up Appointments: Return Appointment in 1 week. Home Health: [place]: -  [facility] for wound care. May utilize formulary equivalent dressing for wound treatment orders unless otherwise specified. Home Health Nurse may visit [facility] to address patient s wound care needs. Scheduled days for dressing changes to be completed; exception, patient has scheduled wound care visit that day. **Please direct any NON-WOUND related issues/requests for orders to patient's Primary Care Physician. **If current dressing causes regression in wound condition, may D/C ordered dressing product/s and apply Normal Saline Moist Dressing daily until next [place] or Other MD appointment. **Notify [facility]aling Center of regression in wound condition at [phone]. Bathing/ Shower/ Hygiene: May shower; gently cleanse wound with antibacterial soap, rinse and pat dry prior to dressing wounds Medications-Please add to medication list.: P.O. Antibiotics - Start Augmentin WOUND #10: - Foot Wound Laterality: Dorsal, Left Cleanser: Normal Saline 3 x Per Week/30 Days Discharge Instructions: Wash your hands with soap and water. Remove old dressing, discard into plastic bag and place into trash. Cleanse the wound with Normal Saline prior to applying a clean dressing using gauze sponges, not tissues or cotton balls. Do not scrub or use excessive force. Pat dry using gauze sponges, not tissue or cotton balls. Primary Dressing: Hydrofera Blue Classic Foam Rope Dressing, 9x6 (mm/in) 3 x Per Week/30 Days Discharge Instructions: Cut about 3 cm in length, cut in half or 4ths if needed-pack in wound and  moisten with a couple drops of saline AFTER hydrofera is in (extra sent with patient) Secondary Dressing: Mepilex Border Flex, 4x4 (in/in) 3 x Per Week/30 Days Discharge Instructions: Apply to wound as directed. Do not cut. WOUND #9: - Lower Leg Wound Laterality: Left Cleanser: Normal Saline 3 x Per Week/30 Days Discharge Instructions: Wash your hands with soap and water. Remove old dressing, discard into plastic bag and place into trash. Cleanse the wound with Normal Saline prior to applying a clean dressing using gauze sponges, not tissues or cotton balls. Do not scrub or use excessive force. Pat dry using gauze sponges, not tissue or cotton balls. Primary Dressing: Hydrofera Blue Ready Transfer Foam, 2.5x2.5 (in/in) 3 x Per Week/30 Days Discharge Instructions: Apply Hydrofera Blue Ready to wound bed as directed Secondary Dressing: Mepilex Border Flex, 4x4 (in/in) 3 x Per Week/30 Days Discharge Instructions: Apply to wound as directed. Do not cut. 1. Would recommend currently that we continue with the Hydrofera Blue applied to the leg ulcer which I think is doing a great job. 2. I am also can recommend that we continue with the Hydrofera Blue rope to the foot. This can be cut into fourth if need be by home health although right now cutting it in half seems to have been helpful and has gotten into the air without any complication. The big thing is we need to make sure it is down to the base of the wound to allow for healing from the bottom out. We will see patient back for reevaluation in 1 week here in the clinic. If anything worsens or changes patient will contact our office for additional recommendations. Electronic Signature(s) Signed: 05/31/2021 2:42:33 PM By: Maple Hudson, Van Clines ([id number]) Entered By: Worthy Keeler on 05/31/2021 14:42:33 [name] ([id number]) -------------------------------------------------------------------------------- SuperBill  Details Patient Name: [name] Date of Service: 05/31/2021 Medical Record Number: [id number] Patient Account Number: [id number] Date of Birth/Sex: 1956-03-26 (65 y.o. 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 S81.812A Laceration without foreign body, left lower leg, initial encounter N18.4 Chronic kidney disease, stage 4 (severe) I50.42 Chronic combined systolic (congestive) and diastolic (congestive) heart failure I10 Essential (primary) hypertension Facility Procedures CPT4 Code: [id number] Description: 99213 - WOUND CARE VISIT-LEV 3 EST PT Modifier: Quantity: 1 Physician Procedures CPT4 Code: [id number] Description: 99214 - WC PHYS LEVEL 4 - EST PT Modifier: Quantity: 1 CPT4 Code: Description: ICD-10 Diagnosis Description E11.621 Type 2 diabetes mellitus with foot ulcer L97.522 Non-pressure chronic ulcer of other part of left foot with fat layer ex T41.962I Laceration without foreign body, left lower leg, initial encounter N18.4  Chronic kidney disease, stage 4 (severe) Modifier: posed Quantity: Electronic Signature(s) Signed: 05/31/2021 3:34:58 PM By: Dolan Amen RN Signed: 05/31/2021 3:56:40 PM By: Worthy Keeler PA-C Previous Signature: 05/31/2021 2:42:46 PM Version By: Worthy Keeler PA-C Entered By: Dolan Amen on 05/31/2021 14:49:21

## 2021-06-05 ENCOUNTER — Other Ambulatory Visit: Payer: Self-pay | Admitting: Family Medicine

## 2021-06-05 ENCOUNTER — Encounter (HOSPITAL_COMMUNITY): Payer: Self-pay | Admitting: Cardiology

## 2021-06-05 NOTE — Telephone Encounter (Signed)
Last OV 02/15/21 Medicare Wellness Visit 03/21/21  Last refill - Historical Provider

## 2021-06-06 NOTE — Telephone Encounter (Signed)
Sent. Thanks.   

## 2021-06-07 ENCOUNTER — Other Ambulatory Visit: Payer: Self-pay

## 2021-06-07 ENCOUNTER — Ambulatory Visit (INDEPENDENT_AMBULATORY_CARE_PROVIDER_SITE_OTHER): Payer: HMO | Admitting: Podiatry

## 2021-06-07 DIAGNOSIS — L97522 Non-pressure chronic ulcer of other part of left foot with fat layer exposed: Secondary | ICD-10-CM

## 2021-06-07 DIAGNOSIS — E0843 Diabetes mellitus due to underlying condition with diabetic autonomic (poly)neuropathy: Secondary | ICD-10-CM

## 2021-06-08 ENCOUNTER — Encounter: Payer: HMO | Attending: Physician Assistant | Admitting: Physician Assistant

## 2021-06-08 DIAGNOSIS — I132 Hypertensive heart and chronic kidney disease with heart failure and with stage 5 chronic kidney disease, or end stage renal disease: Secondary | ICD-10-CM | POA: Diagnosis not present

## 2021-06-08 DIAGNOSIS — G40909 Epilepsy, unspecified, not intractable, without status epilepticus: Secondary | ICD-10-CM | POA: Diagnosis not present

## 2021-06-08 DIAGNOSIS — E785 Hyperlipidemia, unspecified: Secondary | ICD-10-CM | POA: Diagnosis not present

## 2021-06-08 DIAGNOSIS — X58XXXA Exposure to other specified factors, initial encounter: Secondary | ICD-10-CM | POA: Diagnosis not present

## 2021-06-08 DIAGNOSIS — L97522 Non-pressure chronic ulcer of other part of left foot with fat layer exposed: Secondary | ICD-10-CM | POA: Diagnosis not present

## 2021-06-08 DIAGNOSIS — G4733 Obstructive sleep apnea (adult) (pediatric): Secondary | ICD-10-CM | POA: Insufficient documentation

## 2021-06-08 DIAGNOSIS — I252 Old myocardial infarction: Secondary | ICD-10-CM | POA: Insufficient documentation

## 2021-06-08 DIAGNOSIS — E1142 Type 2 diabetes mellitus with diabetic polyneuropathy: Secondary | ICD-10-CM | POA: Diagnosis not present

## 2021-06-08 DIAGNOSIS — Z95810 Presence of automatic (implantable) cardiac defibrillator: Secondary | ICD-10-CM | POA: Insufficient documentation

## 2021-06-08 DIAGNOSIS — E11621 Type 2 diabetes mellitus with foot ulcer: Secondary | ICD-10-CM | POA: Diagnosis not present

## 2021-06-08 DIAGNOSIS — E1122 Type 2 diabetes mellitus with diabetic chronic kidney disease: Secondary | ICD-10-CM | POA: Diagnosis not present

## 2021-06-08 DIAGNOSIS — L97822 Non-pressure chronic ulcer of other part of left lower leg with fat layer exposed: Secondary | ICD-10-CM | POA: Diagnosis not present

## 2021-06-08 DIAGNOSIS — D509 Iron deficiency anemia, unspecified: Secondary | ICD-10-CM | POA: Insufficient documentation

## 2021-06-08 DIAGNOSIS — Z6841 Body Mass Index (BMI) 40.0 and over, adult: Secondary | ICD-10-CM | POA: Diagnosis not present

## 2021-06-08 DIAGNOSIS — M1990 Unspecified osteoarthritis, unspecified site: Secondary | ICD-10-CM | POA: Insufficient documentation

## 2021-06-08 DIAGNOSIS — N186 End stage renal disease: Secondary | ICD-10-CM | POA: Diagnosis not present

## 2021-06-08 DIAGNOSIS — Z955 Presence of coronary angioplasty implant and graft: Secondary | ICD-10-CM | POA: Diagnosis not present

## 2021-06-08 DIAGNOSIS — I2510 Atherosclerotic heart disease of native coronary artery without angina pectoris: Secondary | ICD-10-CM | POA: Insufficient documentation

## 2021-06-08 DIAGNOSIS — I5042 Chronic combined systolic (congestive) and diastolic (congestive) heart failure: Secondary | ICD-10-CM | POA: Insufficient documentation

## 2021-06-08 DIAGNOSIS — S81812A Laceration without foreign body, left lower leg, initial encounter: Secondary | ICD-10-CM | POA: Diagnosis not present

## 2021-06-08 NOTE — Progress Notes (Signed)
[name] ([id number]) Visit Report for 06/08/2021 Arrival Information Details Patient Name: [name] Date of Service: 06/08/2021 8:15 AM Medical Record Number: [id number] Patient Account Number: [id number] Date of Birth/Sex: 08/10/1956 (65 y.o. F) Treating RN: Rogers Blocker Primary Care Sonya Pucci: Crawford Givens Other Clinician: Referring Jasminne Mealy: Crawford Givens Treating Violeta Lecount/Extender: Rowan Blase in Treatment: 2 Visit Information History Since Last Visit Pain Present Now: No Patient Arrived: Walker Arrival Time: 08:22 Accompanied By: self Transfer Assistance: None Patient Identification Verified: Yes Secondary Verification Process Completed: Yes Patient Has Alerts: Yes Patient Alerts: Patient on Blood Thinner ***PLAVIX*** Type II Diabetic HAS PACEMAKER Electronic Signature(s) Signed: 06/08/2021 3:53:03 PM By: Rogers Blocker RN Entered By: Rogers Blocker on 06/08/2021 08:22:25 [name] ([id number]) -------------------------------------------------------------------------------- Clinic Level of Care Assessment Details Patient Name: [name] Date of Service: 06/08/2021 8:15 AM Medical Record Number: [id number] Patient Account Number: [id number] Date of Birth/Sex: 12/18/1955 (65 y.o. F) Treating RN: Rogers Blocker Primary Care Maria Gallicchio: Crawford Givens Other Clinician: Referring Iley Deignan: Crawford Givens Treating Alayssa Flinchum/Extender: Rowan Blase in Treatment: 2 Clinic Level of Care Assessment Items TOOL 1 Quantity Score []  - Use when EandM and Procedure is performed on INITIAL visit 0 ASSESSMENTS - Nursing Assessment / Reassessment []  - General Physical Exam (combine w/ comprehensive assessment (listed just below) when performed on new 0 pt. evals) []  - 0 Comprehensive Assessment (HX, ROS, Risk Assessments, Wounds Hx, etc.) ASSESSMENTS - Wound and Skin Assessment / Reassessment []  - Dermatologic / Skin Assessment (not related  to wound area) 0 ASSESSMENTS - Ostomy and/or Continence Assessment and Care []  - Incontinence Assessment and Management 0 []  - 0 Ostomy Care Assessment and Management (repouching, etc.) PROCESS - Coordination of Care []  - Simple Patient / Family Education for ongoing care 0 []  - 0 Complex (extensive) Patient / Family Education for ongoing care []  - 0 Staff obtains Chiropractor, Records, Test Results / Process Orders []  - 0 Staff telephones HHA, Nursing Homes / Clarify orders / etc []  - 0 Routine Transfer to another Facility (non-emergent condition) []  - 0 Routine Hospital Admission (non-emergent condition) []  - 0 New Admissions / Manufacturing engineer / Ordering NPWT, Apligraf, etc. []  - 0 Emergency Hospital Admission (emergent condition) PROCESS - Special Needs []  - Pediatric / Minor Patient Management 0 []  - 0 Isolation Patient Management []  - 0 Hearing / Language / Visual special needs []  - 0 Assessment of Community assistance (transportation, D/C planning, etc.) []  - 0 Additional assistance / Altered mentation []  - 0 Support Surface(s) Assessment (bed, cushion, seat, etc.) INTERVENTIONS - Miscellaneous []  - External ear exam 0 []  - 0 Patient Transfer (multiple staff / Nurse, adult / Similar devices) []  - 0 Simple Staple / Suture removal (25 or less) []  - 0 Complex Staple / Suture removal (26 or more) []  - 0 Hypo/Hyperglycemic Management (do not check if billed separately) []  - 0 Ankle / Brachial Index (ABI) - do not check if billed separately Has the patient been seen at the hospital within the last three years: Yes Total Score: 0 Level Of Care: ____ [name] ([id number]) Electronic Signature(s) Signed: 06/08/2021 3:53:03 PM By: Rogers Blocker RN Entered By: Rogers Blocker on 06/08/2021 08:57:11 [name] ([id number]) -------------------------------------------------------------------------------- Encounter Discharge Information  Details Patient Name: [name] Date of Service: 06/08/2021 8:15 AM Medical Record Number: [id number] Patient Account Number: [id number] Date of Birth/Sex: 08/16/1956 (65 y.o. F) Treating RN: Rogers Blocker Primary Care Kahlan Engebretson: Crawford Givens  Other Clinician: Referring Jeiden Daughtridge: Crawford Givens Treating Tosca Pletz/Extender: Rowan Blase in Treatment: 2 Encounter Discharge Information Items Post Procedure Vitals Discharge Condition: Stable Temperature (F): 97.8 Ambulatory Status: Walker Pulse (bpm): 56 Discharge Destination: Home Respiratory Rate (breaths/min): 18 Transportation: Private Auto Blood Pressure (mmHg): 140/75 Accompanied By: self Schedule Follow-up Appointment: Yes Clinical Summary of Care: Electronic Signature(s) Signed: 06/08/2021 3:53:03 PM By: Rogers Blocker RN Entered By: Rogers Blocker on 06/08/2021 09:04:09 [name] ([id number]) -------------------------------------------------------------------------------- Lower Extremity Assessment Details Patient Name: [name] Date of Service: 06/08/2021 8:15 AM Medical Record Number: [id number] Patient Account Number: [id number] Date of Birth/Sex: [birth year] (65 y.o. F) Treating RN: Rogers Blocker Primary Care Aliz Meritt: Crawford Givens Other Clinician: Referring Douglass Dunshee: Crawford Givens Treating Kayce Chismar/Extender: Allen Derry Weeks in Treatment: 2 Edema Assessment Assessed: Kyra Searles: Yes] [Right: No] Edema: [Left: Ye] [Right: s] Vascular Assessment Pulses: Dorsalis Pedis Palpable: [Left:Yes] Electronic Signature(s) Signed: 06/08/2021 3:53:03 PM By: Rogers Blocker RN Entered By: Rogers Blocker on 06/08/2021 08:33:15 [name] ([id number]) -------------------------------------------------------------------------------- Multi Wound Chart Details Patient Name: [name] Date of Service: 06/08/2021 8:15 AM Medical Record Number: [id number] Patient Account Number:  [id number] Date of Birth/Sex: 30-Mar-1956 (65 y.o. F) Treating RN: Rogers Blocker Primary Care Calib Wadhwa: Crawford Givens Other Clinician: Referring Carly Applegate: Crawford Givens Treating Gurnoor Sloop/Extender: Allen Derry Weeks in Treatment: 2 Vital Signs Height(in): 68 Pulse(bpm): 56 Weight(lbs): 278 Blood Pressure(mmHg): 140/75 Body Mass Index(BMI): 42 Temperature(F): 97.8 Respiratory Rate(breaths/min): 18 Photos: [N/A:N/A] Wound Location: Left, Dorsal Foot Left Lower Leg N/A Wounding Event: Surgical Injury Trauma N/A Primary Etiology: Dehisced Wound Trauma, Other N/A Comorbid History: Anemia, Arrhythmia, Congestive Anemia, Arrhythmia, Congestive N/A Heart Failure, Hypertension, Heart Failure, Hypertension, Myocardial Infarction, Type II Myocardial Infarction, Type II Diabetes, End Stage Renal Disease, Diabetes, End Stage Renal Disease, Osteoarthritis, Osteomyelitis, Osteoarthritis, Osteomyelitis, Neuropathy, Seizure Disorder Neuropathy, Seizure Disorder Date Acquired: 12/05/2020 02/04/2021 N/A Weeks of Treatment: 2 2 N/A Wound Status: Open Open N/A Measurements L x W x D (cm) 0.3x0.3x2.6 0.4x0.2x0.1 N/A Area (cm) : 0.071 0.063 N/A Volume (cm) : 0.184 0.006 N/A % Reduction in Area: 39.80% 95.30% N/A % Reduction in Volume: 39.90% 95.50% N/A Classification: Full Thickness Without Exposed Full Thickness Without Exposed N/A Support Structures Support Structures Exudate Amount: Medium Medium N/A Exudate Type: Serosanguineous Serosanguineous N/A Exudate Color: red, brown red, brown N/A Wound Margin: N/A Flat and Intact N/A Granulation Amount: None Present (0%) Large (67-100%) N/A Granulation Quality: N/A Red N/A Necrotic Amount: None Present (0%) None Present (0%) N/A Exposed Structures: Fat Layer (Subcutaneous Tissue): Fat Layer (Subcutaneous Tissue): N/A Yes Yes Fascia: No Fascia: No Tendon: No Tendon: No Muscle: No Muscle: No Joint: No Joint: No Bone: No Bone:  No Epithelialization: None Large (67-100%) N/A Treatment Notes Electronic Signature(s) Signed: 06/08/2021 3:53:03 PM By: Rogers Blocker RN Entered By: Rogers Blocker on 06/08/2021 08:53:45 [name] ([id number]) [name] ([id number]) -------------------------------------------------------------------------------- Multi-Disciplinary Care Plan Details Patient Name: [name] Date of Service: 06/08/2021 8:15 AM Medical Record Number: [id number] Patient Account Number: [id number] Date of Birth/Sex: 09/07/1956 (65 y.o. F) Treating RN: Rogers Blocker Primary Care Zachari Alberta: Crawford Givens Other Clinician: Referring Lafonda Patron: Crawford Givens Treating Harjas Biggins/Extender: Rowan Blase in Treatment: 2 Active Inactive Necrotic Tissue Nursing Diagnoses: Impaired tissue integrity related to necrotic/devitalized tissue Goals: Necrotic/devitalized tissue will be minimized in the wound bed Date Initiated: 05/22/2021 Target Resolution Date: 05/22/2021 Goal Status: Active Patient/caregiver will verbalize understanding of reason and process for debridement of necrotic tissue Date  Initiated: 05/22/2021 Target Resolution Date: 05/22/2021 Goal Status: Active Interventions: Assess patient pain level pre-, during and post procedure and prior to discharge Provide education on necrotic tissue and debridement process Treatment Activities: Apply topical anesthetic as ordered : 05/22/2021 Biologic debridement : 05/22/2021 Enzymatic debridement : 05/22/2021 Excisional debridement : 05/22/2021 Notes: Wound/Skin Impairment Nursing Diagnoses: Impaired tissue integrity Goals: Patient/caregiver will verbalize understanding of skin care regimen Date Initiated: 05/22/2021 Target Resolution Date: 05/22/2021 Goal Status: Active Ulcer/skin breakdown will have a volume reduction of 30% by week 4 Date Initiated: 05/22/2021 Target Resolution Date: 06/22/2021 Goal Status: Active Ulcer/skin  breakdown will have a volume reduction of 50% by week 8 Date Initiated: 05/22/2021 Target Resolution Date: 07/22/2021 Goal Status: Active Ulcer/skin breakdown will have a volume reduction of 80% by week 12 Date Initiated: 05/22/2021 Target Resolution Date: 08/22/2021 Goal Status: Active Ulcer/skin breakdown will heal within 14 weeks Date Initiated: 05/22/2021 Target Resolution Date: 09/21/2021 Goal Status: Active Interventions: Assess patient/caregiver ability to obtain necessary supplies Assess patient/caregiver ability to perform ulcer/skin care regimen upon admission and as needed Assess ulceration(s) every visit Provide education on ulcer and skin care MCKINNA, MAPES ([id number]) Treatment Activities: Referred to DME Jacinto Keil for dressing supplies : 05/22/2021 Skin care regimen initiated : 05/22/2021 Notes: Electronic Signature(s) Signed: 06/08/2021 3:53:03 PM By: Rogers Blocker RN Entered By: Rogers Blocker on 06/08/2021 08:53:38 [name] ([id number]) -------------------------------------------------------------------------------- Pain Assessment Details Patient Name: [name] Date of Service: 06/08/2021 8:15 AM Medical Record Number: [id number] Patient Account Number: [id number] Date of Birth/Sex: 04-04-1956 (65 y.o. F) Treating RN: Rogers Blocker Primary Care Dalyce Renne: Crawford Givens Other Clinician: Referring Phoenix Dresser: Crawford Givens Treating Zabrina Brotherton/Extender: Rowan Blase in Treatment: 2 Active Problems Location of Pain Severity and Description of Pain Patient Has Paino No Site Locations Rate the pain. Current Pain Level: 0 Pain Management and Medication Current Pain Management: Electronic Signature(s) Signed: 06/08/2021 3:53:03 PM By: Rogers Blocker RN Entered By: Rogers Blocker on 06/08/2021 53:66:44 [name] ([id number]) -------------------------------------------------------------------------------- Patient/Caregiver  Education Details Patient Name: [name] Date of Service: 06/08/2021 8:15 AM Medical Record Number: [id number] Patient Account Number: [id number] Date of Birth/Gender: January 18, 1956 (65 y.o. F) Treating RN: Rogers Blocker Primary Care Physician: Crawford Givens Other Clinician: Referring Physician: Crawford Givens Treating Physician/Extender: Rowan Blase in Treatment: 2 Education Assessment Education Provided To: Patient Education Topics Provided Wound/Skin Impairment: Methods: Explain/Verbal Responses: State content correctly Electronic Signature(s) Signed: 06/08/2021 3:53:03 PM By: Rogers Blocker RN Entered By: Rogers Blocker on 06/08/2021 09:03:04 [name] ([id number]) -------------------------------------------------------------------------------- Wound Assessment Details Patient Name: [name] Date of Service: 06/08/2021 8:15 AM Medical Record Number: [id number] Patient Account Number: [id number] Date of Birth/Sex: [birth year] (65 y.o. F) Treating RN: Rogers Blocker Primary Care Senta Kantor: Crawford Givens Other Clinician: Referring Elysabeth Aust: Crawford Givens Treating Jamesina Gaugh/Extender: Allen Derry Weeks in Treatment: 2 Wound Status Wound Number: 10 Primary Dehisced Wound Etiology: Wound Location: Left, Dorsal Foot Wound Open Wounding Event: Surgical Injury Status: Date Acquired: 12/05/2020 Comorbid Anemia, Arrhythmia, Congestive Heart Failure, Weeks Of Treatment: 2 History: Hypertension, Myocardial Infarction, Type II Diabetes, End Clustered Wound: No Stage Renal Disease, Osteoarthritis, Osteomyelitis, Neuropathy, Seizure Disorder Photos Wound Measurements Length: (cm) 0.3 Width: (cm) 0.3 Depth: (cm) 2.6 Area: (cm) 0.071 Volume: (cm) 0.184 % Reduction in Area: 39.8% % Reduction in Volume: 39.9% Epithelialization: None Tunneling: No Undermining: No Wound Description Classification: Full Thickness Without Exposed Support  Structures Exudate Amount: Medium Exudate Type: Serosanguineous Exudate Color: red, brown  Foul Odor After Cleansing: No Slough/Fibrino No Wound Bed Granulation Amount: None Present (0%) Exposed Structure Necrotic Amount: None Present (0%) Fascia Exposed: No Fat Layer (Subcutaneous Tissue) Exposed: Yes Tendon Exposed: No Muscle Exposed: No Joint Exposed: No Bone Exposed: No Treatment Notes Wound #10 (Foot) Wound Laterality: Dorsal, Left Cleanser Normal Saline Discharge Instruction: Wash your hands with soap and water. Remove old dressing, discard into plastic bag and place into trash. Cleanse the wound with Normal Saline prior to applying a clean dressing using gauze sponges, not tissues or cotton balls. Do not TALAIYAH, WEEKES. ([id number]) scrub or use excessive force. Pat dry using gauze sponges, not tissue or cotton balls. Peri-Wound Care Topical Primary Dressing Hydrofera Blue Classic Foam Rope Dressing, 9x6 (mm/in) Discharge Instruction: Cut about 3 cm in length, cut in half or 4ths if needed-pack in wound and moisten with a couple drops of saline AFTER hydrofera is in (extra sent with patient) Secondary Dressing Mepilex Border Flex, 4x4 (in/in) Discharge Instruction: Apply to wound as directed. Do not cut. Secured With Compression Wrap Compression Stockings Add-Ons Electronic Signature(s) Signed: 06/08/2021 3:53:03 PM By: Rogers Blocker RN Entered By: Rogers Blocker on 06/08/2021 08:31:42 [name] ([id number]) -------------------------------------------------------------------------------- Wound Assessment Details Patient Name: [name] Date of Service: 06/08/2021 8:15 AM Medical Record Number: [id number] Patient Account Number: [id number] Date of Birth/Sex: 11-11-1955 (65 y.o. F) Treating RN: Rogers Blocker Primary Care Ariyannah Pauling: Crawford Givens Other Clinician: Referring Alen Matheson: Crawford Givens Treating Breea Loncar/Extender: Allen Derry Weeks in  Treatment: 2 Wound Status Wound Number: 9 Primary Trauma, Other Etiology: Wound Location: Left Lower Leg Wound Open Wounding Event: Trauma Status: Date Acquired: 02/04/2021 Comorbid Anemia, Arrhythmia, Congestive Heart Failure, Weeks Of Treatment: 2 History: Hypertension, Myocardial Infarction, Type II Diabetes, End Clustered Wound: No Stage Renal Disease, Osteoarthritis, Osteomyelitis, Neuropathy, Seizure Disorder Photos Wound Measurements Length: (cm) 0.4 Width: (cm) 0.2 Depth: (cm) 0.1 Area: (cm) 0.063 Volume: (cm) 0.006 % Reduction in Area: 95.3% % Reduction in Volume: 95.5% Epithelialization: Large (67-100%) Tunneling: No Undermining: No Wound Description Classification: Full Thickness Without Exposed Support Structures Wound Margin: Flat and Intact Exudate Amount: Medium Exudate Type: Serosanguineous Exudate Color: red, brown Foul Odor After Cleansing: No Slough/Fibrino No Wound Bed Granulation Amount: Large (67-100%) Exposed Structure Granulation Quality: Red Fascia Exposed: No Necrotic Amount: None Present (0%) Fat Layer (Subcutaneous Tissue) Exposed: Yes Tendon Exposed: No Muscle Exposed: No Joint Exposed: No Bone Exposed: No Treatment Notes Wound #9 (Lower Leg) Wound Laterality: Left Cleanser Normal Saline Discharge Instruction: Wash your hands with soap and water. Remove old dressing, discard into plastic bag and place into trash. Cleanse the wound with Normal Saline prior to applying a clean dressing using gauze sponges, not tissues or cotton balls. Do not RHYLYNN, RUDIE. ([id number]) scrub or use excessive force. Pat dry using gauze sponges, not tissue or cotton balls. Peri-Wound Care Topical Primary Dressing Xeroform-HBD 2x2 (in/in) Discharge Instruction: Apply Xeroform-HBD 2x2 (in/in) as directed Secondary Dressing Mepilex Border Flex, 4x4 (in/in) Discharge Instruction: Apply to wound as directed. Do not cut. Secured With Compression  Wrap Compression Stockings Add-Ons Electronic Signature(s) Signed: 06/08/2021 3:53:03 PM By: Rogers Blocker RN Entered By: Rogers Blocker on 06/08/2021 08:32:14 [name] ([id number]) -------------------------------------------------------------------------------- Vitals Details Patient Name: [name] Date of Service: 06/08/2021 8:15 AM Medical Record Number: [id number] Patient Account Number: [id number] Date of Birth/Sex: 1956/05/31 (65 y.o. F) Treating RN: Rogers Blocker Primary Care Essance Gatti: Crawford Givens Other Clinician: Referring Pegi Milazzo: Crawford Givens Treating Suzanna Zahn/Extender:  Allen Derry Weeks in Treatment: 2 Vital Signs Time Taken: 08:22 Temperature (F): 97.8 Height (in): 68 Pulse (bpm): 56 Weight (lbs): 278 Respiratory Rate (breaths/min): 18 Body Mass Index (BMI): 42.3 Blood Pressure (mmHg): 140/75 Reference Range: 80 - 120 mg / dl Electronic Signature(s) Signed: 06/08/2021 3:53:03 PM By: Rogers Blocker RN Entered By: Rogers Blocker on 06/08/2021 08:24:05

## 2021-06-08 NOTE — Progress Notes (Signed)
[name], [name] ([id number]) Visit Report for 06/08/2021 Chief Complaint Document Details Patient Name: [name] Date of Service: 06/08/2021 8:15 AM Medical Record Number: [id number] Patient Account Number: [id number] Date of Birth/Sex: 10/17/1955 (65 y.o. F) Treating RN: Dolan Amen Primary Care Provider: Elsie Stain Other Clinician: Referring Provider: Elsie Stain Treating Provider/Extender: Skipper Cliche in Treatment: 2 Information Obtained from: Patient Chief Complaint Left LE and Left Foot Ulcer Electronic Signature(s) Signed: 06/08/2021 4:48:27 PM By: Worthy Keeler PA-C Entered By: Worthy Keeler on 06/08/2021 08:21:29 [name] ([id number]) -------------------------------------------------------------------------------- Debridement Details Patient Name: [name] Date of Service: 06/08/2021 8:15 AM Medical Record Number: [id number] Patient Account Number: [id number] Date of Birth/Sex: [birth year] (65 y.o. F) Treating RN: Dolan Amen Primary Care Provider: Elsie Stain Other Clinician: Referring Provider: Elsie Stain Treating Provider/Extender: Skipper Cliche in Treatment: 2 Debridement Performed for Wound #9 Left Lower Leg Assessment: Performed By: Physician Tommie Sams., PA-C Debridement Type: Debridement Level of Consciousness (Pre- Awake and Alert procedure): Pre-procedure Verification/Time Out Yes - 08:54 Taken: Start Time: 08:54 Total Area Debrided (L x W): 1 (cm) x 1 (cm) = 1 (cm) Tissue and other material Non-Viable, Skin: Epidermis debrided: Level: Skin/Epidermis Debridement Description: Selective/Open Wound Instrument: Curette Bleeding: Minimum Hemostasis Achieved: Pressure Response to Treatment: Procedure was tolerated well Level of Consciousness (Post- Awake and Alert procedure): Post Debridement Measurements of Total Wound Length: (cm) 0.4 Width: (cm) 0.2 Depth: (cm) 0.1 Volume: (cm)  0.006 Character of Wound/Ulcer Post Debridement: Stable Post Procedure Diagnosis Same as Pre-procedure Electronic Signature(s) Signed: 06/08/2021 3:53:03 PM By: Dolan Amen RN Signed: 06/08/2021 4:48:27 PM By: Worthy Keeler PA-C Entered By: Dolan Amen on 06/08/2021 08:56:31 [name] ([id number]) -------------------------------------------------------------------------------- HPI Details Patient Name: [name] Date of Service: 06/08/2021 8:15 AM Medical Record Number: [id number] Patient Account Number: [id number] Date of Birth/Sex: 1956-06-10 (65 y.o. F) Treating RN: Dolan Amen Primary Care Provider: Elsie Stain Other Clinician: Referring Provider: Elsie Stain Treating Provider/Extender: Skipper Cliche in Treatment: 2 History of Present Illness HPI Description: 65 year old patient who is known to have diabetes for several years and generally has it under good control last hemoglobin A1c was 6.7 done 2 weeks ago. She has several other comorbidities including acute CHF, hypoxia, hyperlipidemia, systolic heart failure, myocardial infarction, coronary artery disease, iron deficiency anemia, renal insufficiency, essential hypertension, morbid obesity and obstructive sleep apnea. 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 In the past she's had a coronary angioplasty with stent in 2013, laparoscopic cholecystectomy in 2011, bilateral oophorectomy in April 2012 and she's had some lumbar disc surgery in 1996. She says she's had a lot of peripheral neuropathy and due to this she's had a ulcer on her left second toe for a while. She had a dry eschar on her left heel and recently this was removed and she found that she had some infection and some drainage from there. She has minimal pain around the heel but no pain on any areas of her toes. she has not been on any antibiotics recently and has known vascular problems  in the recent past. Readmission: 05/22/2021 upon evaluation today patient appears to be doing somewhat poorly in regard to the wound that is present on her left lateral lower leg as well as the left dorsal foot. The foot is very minimal and seems to be doing decently well the leg is a little bit worse. The foot is been  present since about March 1 and the leg since about May 1. Subsequently the patient tells me that this is due to a trauma. She does have diabetes. With that being said she also has chronic kidney disease stage IV, congestive heart failure, and hypertension. She tells me that this is actually gotten significantly better since the initial injury. With that being said there still is a significant issue here that we do need to try to get under control. The patient voiced understanding. Nonetheless I do believe that this is going to require regular wound care here in the clinic with [place]. Patient's wounds are going also require some sharp debridement to clear away some of the necrotic tissue from both locations and I think this is absolutely appropriate based on what we are seeing today I think it would also help with getting things moving in a much better direction in general. 05/31/2021 upon evaluation today patient actually appears to be doing excellent in regard to her wounds. I am actually very pleased with where things stand today. There is no signs of active infection at this time. No fevers, chills, nausea, vomiting, or diarrhea. 06/08/2021 upon evaluation today patient appears to be doing well with regard to her wound. On the leg especially this is very close to complete resolution though is getting really dry at this point I think we can use some Xeroform to try to see if that would be of benefit. She is in agreement with that plan. In regard to the foot this is showing signs of improvement although it still taking some time or doing the [facility] Blue rope packing. Electronic  Signature(s) Signed: 06/08/2021 9:05:04 AM By: Worthy Keeler PA-C Entered By: Worthy Keeler on 06/08/2021 09:05:04 [name] ([id number]) -------------------------------------------------------------------------------- Physical Exam Details Patient Name: [name] Date of Service: 06/08/2021 8:15 AM Medical Record Number: [id number] Patient Account Number: [id number] Date of Birth/Sex: 1956-05-25 (65 y.o. F) Treating RN: Dolan Amen Primary Care Provider: Elsie Stain Other Clinician: Referring Provider: Elsie Stain Treating Provider/Extender: Jeri Cos Weeks in Treatment: 2 Constitutional Well-nourished and well-hydrated in no acute distress. Respiratory normal breathing without difficulty. Psychiatric this patient is able to make decisions and demonstrates good insight into disease process. Alert and Oriented x 3. pleasant and cooperative. Notes Upon inspection patient's wound bed actually showed signs of fairly good granulation epithelization at this point. There was some sharp debridement necessary on the leg in order to clear away some of the necrotic tissue on the surface and dry skin that was pretty much yet. This was trapped in a little bit of fluid underneath once removed this looks to be doing much better. Electronic Signature(s) Signed: 06/08/2021 9:05:26 AM By: Worthy Keeler PA-C Entered By: Worthy Keeler on 06/08/2021 09:05:26 [name] ([id number]) -------------------------------------------------------------------------------- Physician Orders Details Patient Name: [name] Date of Service: 06/08/2021 8:15 AM Medical Record Number: [id number] Patient Account Number: [id number] Date of Birth/Sex: [birth year] (65 y.o. F) Treating RN: Dolan Amen Primary Care Provider: Elsie Stain Other Clinician: Referring Provider: Elsie Stain Treating Provider/Extender: Skipper Cliche in Treatment: 2 Verbal / Phone Orders:  No Diagnosis Coding ICD-10 Coding Code Description E11.621 Type 2 diabetes mellitus with foot ulcer L97.522 Non-pressure chronic ulcer of other part of left foot with fat layer exposed S81.812A Laceration without foreign body, left lower leg, initial encounter N18.4 Chronic kidney disease, stage 4 (severe) I50.42 Chronic combined systolic (congestive) and diastolic (congestive) heart failure I10 Essential (primary) hypertension Follow-up  Appointments o Return Appointment in 1 week. [place] for wound care. May utilize formulary equivalent dressing for wound treatment orders unless otherwise specified. Home Health Nurse may visit [facility] to address patientos wound care needs. o Scheduled days for dressing changes to be completed; exception, patient has scheduled wound care visit that day. o **Please direct any NON-WOUND related issues/requests for orders to patient's Primary Care Physician. **If current dressing causes regression in wound condition, may D/C ordered dressing product/s and apply Normal Saline Moist Dressing daily until next [place] or Other MD appointment. **Notify [facility] of regression in wound condition at [phone]. Bathing/ Shower/ Hygiene o May shower; gently cleanse wound with antibacterial soap, rinse and pat dry prior to dressing wounds Wound Treatment Wound #10 - Foot Wound Laterality: Dorsal, Left Cleanser: Normal Saline 3 x Per Week/30 Days Discharge Instructions: Wash your hands with soap and water. Remove old dressing, discard into plastic bag and place into trash. Cleanse the wound with Normal Saline prior to applying a clean dressing using gauze sponges, not tissues or cotton balls. Do not scrub or use excessive force. Pat dry using gauze sponges, not tissue or cotton balls. Primary Dressing: Hydrofera Blue Classic Foam Rope Dressing, 9x6 (mm/in) 3 x Per Week/30  Days Discharge Instructions: Cut about 3 cm in length, cut in half or 4ths if needed-pack in wound and moisten with a couple drops of saline AFTER hydrofera is in (extra sent with patient) Secondary Dressing: Mepilex Border Flex, 4x4 (in/in) 3 x Per Week/30 Days Discharge Instructions: Apply to wound as directed. Do not cut. Wound #9 - Lower Leg Wound Laterality: Left Cleanser: Normal Saline 3 x Per Week/30 Days Discharge Instructions: Wash your hands with soap and water. Remove old dressing, discard into plastic bag and place into trash. Cleanse the wound with Normal Saline prior to applying a clean dressing using gauze sponges, not tissues or cotton balls. Do not scrub or use excessive force. Pat dry using gauze sponges, not tissue or cotton balls. Primary Dressing: Xeroform-HBD 2x2 (in/in) 3 x Per Week/30 Days Discharge Instructions: Apply Xeroform-HBD 2x2 (in/in) as directed Secondary Dressing: Mepilex Border Flex, 4x4 (in/in) 3 x Per Week/30 Days Discharge Instructions: Apply to wound as directed. Do not cut. [name], [name] ([id number]) Electronic Signature(s) Signed: 06/08/2021 3:53:03 PM By: Dolan Amen RN Signed: 06/08/2021 4:48:27 PM By: Worthy Keeler PA-C Entered By: Dolan Amen on 06/08/2021 08:57:04 [name] ([id number]) -------------------------------------------------------------------------------- Problem List Details Patient Name: [name] Date of Service: 06/08/2021 8:15 AM Medical Record Number: [id number] Patient Account Number: [id number] Date of Birth/Sex: 10-20-1955 (65 y.o. F) Treating RN: Dolan Amen Primary Care Provider: Elsie Stain Other Clinician: Referring Provider: Elsie Stain Treating Provider/Extender: Skipper Cliche in Treatment: 2 Active Problems ICD-10 Encounter Code Description Active Date MDM Diagnosis E11.621 Type 2 diabetes mellitus with foot ulcer 05/22/2021 No Yes L97.522 Non-pressure chronic ulcer of  other part of left foot with fat layer 05/22/2021 No Yes exposed S81.812A Laceration without foreign body, left lower leg, initial encounter 05/22/2021 No Yes N18.4 Chronic kidney disease, stage 4 (severe) 05/22/2021 No Yes I50.42 Chronic combined systolic (congestive) and diastolic (congestive) heart 05/22/2021 No Yes failure I10 Essential (primary) hypertension 05/22/2021 No Yes Inactive Problems Resolved Problems Electronic Signature(s) Signed: 06/08/2021 4:48:27 PM By: Worthy Keeler PA-C Entered By: Worthy Keeler on 06/08/2021 08:21:18 [name] ([id number]) -------------------------------------------------------------------------------- Progress Note Details Patient  Name: [name], [name] Date of Service: 06/08/2021 8:15 AM Medical Record Number: [id number] Patient Account Number: [id number] Date of Birth/Sex: 1956-03-22 (65 y.o. F) Treating RN: Dolan Amen Primary Care Provider: Elsie Stain Other Clinician: Referring Provider: Elsie Stain Treating Provider/Extender: Skipper Cliche in Treatment: 2 Subjective Chief Complaint Information obtained from Patient Left LE and Left Foot Ulcer History of Present Illness (HPI) 65 year old patient who is known to have diabetes for several years and generally has it under good control last hemoglobin A1c was 6.7 done 2 weeks ago. She has several other comorbidities including acute CHF, hypoxia, hyperlipidemia, systolic heart failure, myocardial infarction, coronary artery disease, iron deficiency anemia, renal insufficiency, essential hypertension, morbid obesity and obstructive sleep apnea. 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 In the past she's had a coronary angioplasty with stent in 2013, laparoscopic cholecystectomy in 2011, bilateral oophorectomy in April 2012 and she's had some lumbar disc surgery in 1996. She says she's had a lot of peripheral neuropathy and due  to this she's had a ulcer on her left second toe for a while. She had a dry eschar on her left heel and recently this was removed and she found that she had some infection and some drainage from there. She has minimal pain around the heel but no pain on any areas of her toes. she has not been on any antibiotics recently and has known vascular problems in the recent past. Readmission: 05/22/2021 upon evaluation today patient appears to be doing somewhat poorly in regard to the wound that is present on her left lateral lower leg as well as the left dorsal foot. The foot is very minimal and seems to be doing decently well the leg is a little bit worse. The foot is been present since about March 1 and the leg since about May 1. Subsequently the patient tells me that this is due to a trauma. She does have diabetes. With that being said she also has chronic kidney disease stage IV, congestive heart failure, and hypertension. She tells me that this is actually gotten significantly better since the initial injury. With that being said there still is a significant issue here that we do need to try to get under control. The patient voiced understanding. Nonetheless I do believe that this is going to require regular wound care here in the clinic with [place]. Patient's wounds are going also require some sharp debridement to clear away some of the necrotic tissue from both locations and I think this is absolutely appropriate based on what we are seeing today I think it would also help with getting things moving in a much better direction in general. 05/31/2021 upon evaluation today patient actually appears to be doing excellent in regard to her wounds. I am actually very pleased with where things stand today. There is no signs of active infection at this time. No fevers, chills, nausea, vomiting, or diarrhea. 06/08/2021 upon evaluation today patient appears to be doing well with regard to her wound. On the leg especially  this is very close to complete resolution though is getting really dry at this point I think we can use some Xeroform to try to see if that would be of benefit. She is in agreement with that plan. In regard to the foot this is showing signs of improvement although it still taking some time or doing the [facility] Blue rope packing. Objective Constitutional Well-nourished and well-hydrated in no acute distress. Vitals  Time Taken: 8:22 AM, Height: 68 in, Weight: 278 lbs, BMI: 42.3, Temperature: 97.8 F, Pulse: 56 bpm, Respiratory Rate: 18 breaths/min, Blood Pressure: 140/75 mmHg. Respiratory normal breathing without difficulty. Psychiatric this patient is able to make decisions and demonstrates good insight into disease process. Alert and Oriented x 3. pleasant and cooperative. General Notes: Upon inspection patient's wound bed actually showed signs of fairly good granulation epithelization at this point. There was some sharp debridement necessary on the leg in order to clear away some of the necrotic tissue on the surface and dry skin that was pretty much yet. This was trapped in a little bit of fluid underneath once removed this looks to be doing much better. Integumentary (Hair, Skin) Wound #10 status is Open. Original cause of wound was Surgical Injury. The date acquired was: 12/05/2020. The wound has been in treatment 2 [name], [name]. ([id number]) weeks. The wound is located on the Left,Dorsal Foot. The wound measures 0.3cm length x 0.3cm width x 2.6cm depth; 0.071cm^2 area and 0.184cm^3 volume. There is Fat Layer (Subcutaneous Tissue) exposed. There is no tunneling or undermining noted. There is a medium amount of serosanguineous drainage noted. There is no granulation within the wound bed. There is no necrotic tissue within the wound bed. Wound #9 status is Open. Original cause of wound was Trauma. The date acquired was: 02/04/2021. The wound has been in treatment 2 weeks. The wound is  located on the Left Lower Leg. The wound measures 0.4cm length x 0.2cm width x 0.1cm depth; 0.063cm^2 area and 0.006cm^3 volume. There is Fat Layer (Subcutaneous Tissue) exposed. There is no tunneling or undermining noted. There is a medium amount of serosanguineous drainage noted. The wound margin is flat and intact. There is large (67-100%) red granulation within the wound bed. There is no necrotic tissue within the wound bed. Assessment Active Problems ICD-10 Type 2 diabetes mellitus with foot ulcer Non-pressure chronic ulcer of other part of left foot with fat layer exposed Laceration without foreign body, left lower leg, initial encounter Chronic kidney disease, stage 4 (severe) Chronic combined systolic (congestive) and diastolic (congestive) heart failure Essential (primary) hypertension Procedures Wound #9 Pre-procedure diagnosis of Wound #9 is a Trauma, Other located on the Left Lower Leg . There was a Selective/Open Wound Skin/Epidermis Debridement with a total area of 1 sq cm performed by Tommie Sams., PA-C. With the following instrument(s): Curette to remove Non-Viable tissue/material. Material removed includes Skin: Epidermis. A time out was conducted at 08:54, prior to the start of the procedure. A Minimum amount of bleeding was controlled with Pressure. The procedure was tolerated well. Post Debridement Measurements: 0.4cm length x 0.2cm width x 0.1cm depth; 0.006cm^3 volume. Character of Wound/Ulcer Post Debridement is stable. Post procedure Diagnosis Wound #9: Same as Pre-Procedure Plan Follow-up Appointments: Return Appointment in 1 week. Home Health: [facility]: - [place] for wound care. May utilize formulary equivalent dressing for wound treatment orders unless otherwise specified. Home Health Nurse may visit [facility] to address patient s wound care needs. Scheduled days for dressing changes to be completed; exception, patient has scheduled  wound care visit that day. **Please direct any NON-WOUND related issues/requests for orders to patient's Primary Care Physician. **If current dressing causes regression in wound condition, may D/C ordered dressing product/s and apply Normal Saline Moist Dressing daily until next [place] or Other MD appointment. **Notify [facility] of regression in wound condition at [phone]. Bathing/ Shower/ Hygiene: May shower; gently  cleanse wound with antibacterial soap, rinse and pat dry prior to dressing wounds WOUND #10: - Foot Wound Laterality: Dorsal, Left Cleanser: Normal Saline 3 x Per Week/30 Days Discharge Instructions: Wash your hands with soap and water. Remove old dressing, discard into plastic bag and place into trash. Cleanse the wound with Normal Saline prior to applying a clean dressing using gauze sponges, not tissues or cotton balls. Do not scrub or use excessive force. Pat dry using gauze sponges, not tissue or cotton balls. Primary Dressing: Hydrofera Blue Classic Foam Rope Dressing, 9x6 (mm/in) 3 x Per Week/30 Days Discharge Instructions: Cut about 3 cm in length, cut in half or 4ths if needed-pack in wound and moisten with a couple drops of saline AFTER hydrofera is in (extra sent with patient) Secondary Dressing: Mepilex Border Flex, 4x4 (in/in) 3 x Per Week/30 Days Discharge Instructions: Apply to wound as directed. Do not cut. WOUND #9: - Lower Leg Wound Laterality: Left Cleanser: Normal Saline 3 x Per Week/30 Days Discharge Instructions: Wash your hands with soap and water. Remove old dressing, discard into plastic bag and place into trash. Cleanse the wound with Normal Saline prior to applying a clean dressing using gauze sponges, not tissues or cotton balls. Do not scrub or use excessive force. Pat dry using gauze sponges, not tissue or cotton balls. [name], [name] ([id number]) Primary Dressing: Xeroform-HBD 2x2 (in/in) 3 x Per Week/30  Days Discharge Instructions: Apply Xeroform-HBD 2x2 (in/in) as directed Secondary Dressing: Mepilex Border Flex, 4x4 (in/in) 3 x Per Week/30 Days Discharge Instructions: Apply to wound as directed. Do not cut. 1. Would recommend currently that we go ahead and initiate treatment with a continuation of the Hydrofera Blue rope for the foot. I think that still appropriate. 2. I am also can recommend that we go ahead and switch to Xeroform for the leg this is getting so dry and crusty that is actually trapping fluid up underneath I think the Xeroform may do better in this regard we will see how things do. We will see patient back for reevaluation in 1 week here in the clinic. If anything worsens or changes patient will contact our office for additional recommendations. Electronic Signature(s) Signed: 06/08/2021 9:06:39 AM By: Worthy Keeler PA-C Entered By: Worthy Keeler on 06/08/2021 09:06:39 [name] ([id number]) -------------------------------------------------------------------------------- SuperBill Details Patient Name: [name] Date of Service: 06/08/2021 Medical Record Number: [id number] Patient Account Number: [id number] Date of Birth/Sex: December 22, 1955 (65 y.o. 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 S81.812A Laceration without foreign body, left lower leg, initial encounter N18.4 Chronic kidney disease, stage 4 (severe) I50.42 Chronic combined systolic (congestive) and diastolic (congestive) heart failure I10 Essential (primary) hypertension Facility Procedures CPT4 Code: [id number] Description: [phone] - DEBRIDE WOUND 1ST 20 SQ CM OR  < Modifier: Quantity: 1 CPT4 Code: Description: ICD-10 Diagnosis Description S81.812A Laceration without foreign body, left lower leg, initial encounter Modifier: Quantity: Physician Procedures CPT4 Code: [id number] Description: 99214 - WC PHYS LEVEL 4 - EST PT Modifier: 25 Quantity: 1 CPT4 Code: Description: ICD-10 Diagnosis Description E11.621 Type 2 diabetes mellitus with foot ulcer L97.522 Non-pressure chronic ulcer of other part of left foot with fat layer expo Z00.174B Laceration without foreign body, left lower leg,  initial encounter N18.4  Chronic kidney disease, stage 4 (severe) Modifier: sed Quantity: CPT4 Code: [id number] Description: 93594 - WC PHYS DEBR WO ANESTH 20 SQ CM Modifier: Quantity: 1 CPT4 Code: Description: ICD-10 Diagnosis Description S81.812A Laceration without foreign body, left lower leg, initial encounter Modifier: Quantity: Electronic Signature(s) Signed: 06/08/2021 9:10:11 AM By: Worthy Keeler PA-C Entered By: Worthy Keeler on 06/08/2021 09:10:10

## 2021-06-09 DIAGNOSIS — I5022 Chronic systolic (congestive) heart failure: Secondary | ICD-10-CM | POA: Diagnosis not present

## 2021-06-09 DIAGNOSIS — J189 Pneumonia, unspecified organism: Secondary | ICD-10-CM | POA: Diagnosis not present

## 2021-06-09 DIAGNOSIS — G4733 Obstructive sleep apnea (adult) (pediatric): Secondary | ICD-10-CM | POA: Diagnosis not present

## 2021-06-09 DIAGNOSIS — M86171 Other acute osteomyelitis, right ankle and foot: Secondary | ICD-10-CM | POA: Diagnosis not present

## 2021-06-12 ENCOUNTER — Other Ambulatory Visit (HOSPITAL_COMMUNITY): Payer: Self-pay | Admitting: Internal Medicine

## 2021-06-12 DIAGNOSIS — I2510 Atherosclerotic heart disease of native coronary artery without angina pectoris: Secondary | ICD-10-CM | POA: Diagnosis not present

## 2021-06-12 DIAGNOSIS — E1151 Type 2 diabetes mellitus with diabetic peripheral angiopathy without gangrene: Secondary | ICD-10-CM | POA: Diagnosis not present

## 2021-06-12 DIAGNOSIS — D631 Anemia in chronic kidney disease: Secondary | ICD-10-CM | POA: Diagnosis not present

## 2021-06-12 DIAGNOSIS — I4891 Unspecified atrial fibrillation: Secondary | ICD-10-CM | POA: Diagnosis not present

## 2021-06-12 DIAGNOSIS — E1122 Type 2 diabetes mellitus with diabetic chronic kidney disease: Secondary | ICD-10-CM | POA: Diagnosis not present

## 2021-06-12 DIAGNOSIS — I5030 Unspecified diastolic (congestive) heart failure: Secondary | ICD-10-CM | POA: Diagnosis not present

## 2021-06-12 DIAGNOSIS — I447 Left bundle-branch block, unspecified: Secondary | ICD-10-CM | POA: Diagnosis not present

## 2021-06-12 DIAGNOSIS — E039 Hypothyroidism, unspecified: Secondary | ICD-10-CM | POA: Diagnosis not present

## 2021-06-12 DIAGNOSIS — N184 Chronic kidney disease, stage 4 (severe): Secondary | ICD-10-CM | POA: Diagnosis not present

## 2021-06-12 DIAGNOSIS — I130 Hypertensive heart and chronic kidney disease with heart failure and stage 1 through stage 4 chronic kidney disease, or unspecified chronic kidney disease: Secondary | ICD-10-CM | POA: Diagnosis not present

## 2021-06-12 DIAGNOSIS — G4733 Obstructive sleep apnea (adult) (pediatric): Secondary | ICD-10-CM | POA: Diagnosis not present

## 2021-06-12 DIAGNOSIS — E785 Hyperlipidemia, unspecified: Secondary | ICD-10-CM | POA: Diagnosis not present

## 2021-06-12 DIAGNOSIS — I051 Rheumatic mitral insufficiency: Secondary | ICD-10-CM | POA: Diagnosis not present

## 2021-06-12 DIAGNOSIS — M869 Osteomyelitis, unspecified: Secondary | ICD-10-CM | POA: Diagnosis not present

## 2021-06-12 DIAGNOSIS — I252 Old myocardial infarction: Secondary | ICD-10-CM | POA: Diagnosis not present

## 2021-06-12 DIAGNOSIS — S81802D Unspecified open wound, left lower leg, subsequent encounter: Secondary | ICD-10-CM | POA: Diagnosis not present

## 2021-06-12 DIAGNOSIS — I255 Ischemic cardiomyopathy: Secondary | ICD-10-CM | POA: Diagnosis not present

## 2021-06-12 DIAGNOSIS — F32A Depression, unspecified: Secondary | ICD-10-CM | POA: Diagnosis not present

## 2021-06-12 DIAGNOSIS — Z4781 Encounter for orthopedic aftercare following surgical amputation: Secondary | ICD-10-CM | POA: Diagnosis not present

## 2021-06-12 DIAGNOSIS — E1169 Type 2 diabetes mellitus with other specified complication: Secondary | ICD-10-CM | POA: Diagnosis not present

## 2021-06-12 DIAGNOSIS — Z89422 Acquired absence of other left toe(s): Secondary | ICD-10-CM | POA: Diagnosis not present

## 2021-06-12 DIAGNOSIS — I5023 Acute on chronic systolic (congestive) heart failure: Secondary | ICD-10-CM | POA: Diagnosis not present

## 2021-06-12 DIAGNOSIS — I428 Other cardiomyopathies: Secondary | ICD-10-CM | POA: Diagnosis not present

## 2021-06-12 DIAGNOSIS — E1142 Type 2 diabetes mellitus with diabetic polyneuropathy: Secondary | ICD-10-CM | POA: Diagnosis not present

## 2021-06-12 DIAGNOSIS — I70202 Unspecified atherosclerosis of native arteries of extremities, left leg: Secondary | ICD-10-CM | POA: Diagnosis not present

## 2021-06-13 NOTE — Progress Notes (Signed)
Subjective: [name] is a 65 y.o. is seen today in office s/p left foot first metatarsal excision, wound debridement preformed on April 25, 2021.  She did have one area that opened back up and since I saw her she has been referred to the wound care center and she is followed up with them.  She has no new concerns otherwise.  No fevers or chills.   Objective: General: No acute distress, AAOx3  Left foot: Dressing clean, dry, intact.  Incision is healing well except on the central aspect where there is probing noted approximately 1 inch.  There is Hydrofera Blue packed into the wound.  There is no drainage or pus.  No significant erythema, ascending cellulitis.  No fluctuance crepitation but there is no malodor.  No other open lesions or pre-ulcerative lesions.  No pain with calf compression, swelling, warmth, erythema.   Assessment and Plan:  Status post left foot surgery, central dehiscence  -Treatment options discussed including all alternatives, risks, and complications -At this point I defer to the wound care center for further treatment of the wound.  Discussed with her that she is doing well at me now. -Monitor for any clinical signs or symptoms of infection and directed to call the office immediately should any occur or go to the ER.  Trula Slade DPM

## 2021-06-15 ENCOUNTER — Other Ambulatory Visit: Payer: Self-pay

## 2021-06-15 ENCOUNTER — Encounter: Payer: HMO | Admitting: Physician Assistant

## 2021-06-15 DIAGNOSIS — E11621 Type 2 diabetes mellitus with foot ulcer: Secondary | ICD-10-CM | POA: Diagnosis not present

## 2021-06-15 DIAGNOSIS — T8189XA Other complications of procedures, not elsewhere classified, initial encounter: Secondary | ICD-10-CM | POA: Diagnosis not present

## 2021-06-15 DIAGNOSIS — L97829 Non-pressure chronic ulcer of other part of left lower leg with unspecified severity: Secondary | ICD-10-CM | POA: Diagnosis not present

## 2021-06-15 NOTE — Progress Notes (Signed)
multiple wounds []  - 0 Large Wound Dressing one or multiple wounds []  - 0 Application of Medications - topical []  - 0 Application of Medications - injection INTERVENTIONS - Miscellaneous []  - External ear exam 0 []  - 0 Specimen Collection (cultures, biopsies, blood, body fluids, etc.) []  - 0 Specimen(s) / Culture(s) sent or taken to Lab for analysis []  - 0 Patient Transfer (multiple staff / Civil Service fast streamer / Similar devices) []  - 0 Simple Staple / Suture removal (25 or less) []  - 0 Complex Staple / Suture removal (26 or more) []  - 0 Hypo / Hyperglycemic Management (close monitor of Blood Glucose) []  - 0 Ankle / Brachial Index (ABI) - do not check if billed separately X- 1 5 Vital Signs Has the patient been seen at the hospital within the last three years: Yes Total Score: 80 Level Of Care: New/Established - Level 3 Electronic Signature(s) Signed: 06/15/2021 4:22:57 PM By: Dolan Amen RN Entered By: Dolan Amen on 06/15/2021 08:56:45 [name] ([id number]) -------------------------------------------------------------------------------- Encounter Discharge Information Details Patient Name: [name] Date of Service: 06/15/2021 8:15 AM Medical Record Number: [id number] Patient Account Number: [id number] Date of Birth/Sex: 10-Sep-1956 (65 y.o. F) Treating RN: Dolan Amen Primary Care Adelita Hone: Elsie Stain Other Clinician: Referring Jahayra Mazo: Elsie Stain Treating Jowell Bossi/Extender: Skipper Cliche in Treatment: 3 Encounter Discharge Information Items Discharge Condition: Stable Ambulatory Status: Walker Discharge Destination: Home Transportation: Private Auto Accompanied By: self Schedule Follow-up Appointment: Yes Clinical Summary of Care: Electronic Signature(s) Signed: 06/15/2021 4:22:57 PM By: Dolan Amen RN Entered By: Dolan Amen on 06/15/2021 08:59:15 [name] ([id number]) -------------------------------------------------------------------------------- Lower Extremity Assessment Details Patient Name: [name] Date of Service: 06/15/2021 8:15 AM Medical Record Number: [id number] Patient Account Number: [id number] Date of Birth/Sex: September 10, 1956 (65 y.o. F) Treating RN: Dolan Amen Primary Care Ariyon Mittleman: Elsie Stain Other Clinician: Referring Micala Saltsman: Elsie Stain Treating Tayvien Kane/Extender: Jeri Cos Weeks in Treatment: 3 Vascular Assessment Pulses: Dorsalis Pedis Palpable: [Left:Yes] Electronic Signature(s) Signed: 06/15/2021 4:22:57 PM By: Dolan Amen RN Entered By: Dolan Amen on 06/15/2021 08:30:51 [name] ([id number]) -------------------------------------------------------------------------------- Multi Wound Chart Details Patient Name: [name] Date of Service: 06/15/2021 8:15 AM Medical Record Number: [id number] Patient Account Number: [id number] Date of Birth/Sex: 1955/10/11 (65 y.o. F) Treating RN: Dolan Amen Primary Care Lucian Baswell: Elsie Stain Other Clinician: Referring Angellee Cohill: Elsie Stain Treating Aislynn Cifelli/Extender: Jeri Cos Weeks in Treatment: 3 Vital Signs Height(in): 68 Pulse(bpm): 18 Weight(lbs): 278 Blood Pressure(mmHg): 128/72 Body Mass Index(BMI): 42 Temperature(F): 97.9 Respiratory Rate(breaths/min): 18 Photos: [N/A:N/A] Wound Location: Left, Dorsal Foot Left Lower Leg N/A Wounding Event: Surgical Injury Trauma N/A Primary Etiology: Dehisced Wound Trauma, Other N/A Comorbid History: Anemia, Arrhythmia, Congestive Anemia, Arrhythmia, Congestive N/A Heart Failure, Hypertension, Heart Failure, Hypertension, Myocardial Infarction, Type II Myocardial Infarction, Type II Diabetes, End Stage Renal Disease, Diabetes, End Stage Renal Disease, Osteoarthritis, Osteomyelitis,  Osteoarthritis, Osteomyelitis, Neuropathy, Seizure Disorder Neuropathy, Seizure Disorder Date Acquired: 12/05/2020 02/04/2021 N/A Weeks of Treatment: 3 3 N/A Wound Status: Open Open N/A Measurements L x W x D (cm) 0.2x0.2x2.2 0x0x0 N/A Area (cm) : 0.031 0 N/A Volume (cm) : 0.069 0 N/A % Reduction in Area: 73.70% 100.00% N/A % Reduction in Volume: 77.50% 100.00% N/A Classification: Full Thickness Without Exposed Full Thickness Without Exposed N/A Support Structures Support Structures Exudate Amount: Medium None Present N/A Exudate Type: Serosanguineous N/A N/A Exudate Color: red, brown N/A N/A Wound Margin: N/A Flat and Intact N/A Granulation Amount: None Present (  [name], [name] ([id number]) Visit Report for 06/15/2021 Arrival Information Details Patient Name: [name], [name] Date of Service: 06/15/2021 8:15 AM Medical Record Number: [id number] Patient Account Number: [id number] Date of Birth/Sex: Feb 25, 1956 (65 y.o. F) Treating RN: Dolan Amen Primary Care Fayola Meckes: Elsie Stain Other Clinician: Referring Ammy Lienhard: Elsie Stain Treating Zakariah Dejarnette/Extender: Skipper Cliche in Treatment: 3 Visit Information History Since Last Visit Pain Present Now: No Patient Arrived: Walker Arrival Time: 08:22 Accompanied By: self Transfer Assistance: None Patient Identification Verified: Yes Secondary Verification Process Completed: Yes Patient Has Alerts: Yes Patient Alerts: Patient on Blood Thinner ***PLAVIX*** Type II Diabetic HAS PACEMAKER Electronic Signature(s) Signed: 06/15/2021 4:22:57 PM By: Dolan Amen RN Entered By: Dolan Amen on 06/15/2021 08:22:34 [name] ([id number]) -------------------------------------------------------------------------------- Clinic Level of Care Assessment Details Patient Name: [name] Date of Service: 06/15/2021 8:15 AM Medical Record Number: [id number] Patient Account Number: [id number] Date of Birth/Sex: 1956-06-29 (65 y.o. F) Treating RN: Dolan Amen Primary Care Abdullahi Vallone: Elsie Stain Other Clinician: Referring Krisandra Bueno: Elsie Stain Treating Ieasha Boerema/Extender: Skipper Cliche in Treatment: 3 Clinic Level of Care Assessment Items TOOL 4 Quantity Score X - Use when only an EandM is performed on FOLLOW-UP visit 1 0 ASSESSMENTS - Nursing Assessment / Reassessment X - Reassessment of Co-morbidities (includes updates in patient status) 1 10 X- 1 5 Reassessment of Adherence to Treatment Plan ASSESSMENTS - Wound and Skin Assessment / Reassessment X - Simple Wound Assessment / Reassessment - one wound 1 5 []  - 0 Complex Wound Assessment / Reassessment - multiple  wounds []  - 0 Dermatologic / Skin Assessment (not related to wound area) ASSESSMENTS - Focused Assessment []  - Circumferential Edema Measurements - multi extremities 0 []  - 0 Nutritional Assessment / Counseling / Intervention []  - 0 Lower Extremity Assessment (monofilament, tuning fork, pulses) []  - 0 Peripheral Arterial Disease Assessment (using hand held doppler) ASSESSMENTS - Ostomy and/or Continence Assessment and Care []  - Incontinence Assessment and Management 0 []  - 0 Ostomy Care Assessment and Management (repouching, etc.) PROCESS - Coordination of Care X - Simple Patient / Family Education for ongoing care 1 15 []  - 0 Complex (extensive) Patient / Family Education for ongoing care []  - 0 Staff obtains Programmer, systems, Records, Test Results / Process Orders []  - 0 Staff telephones HHA, Nursing Homes / Clarify orders / etc []  - 0 Routine Transfer to another Facility (non-emergent condition) []  - 0 Routine Hospital Admission (non-emergent condition) []  - 0 New Admissions / Biomedical engineer / Ordering NPWT, Apligraf, etc. []  - 0 Emergency Hospital Admission (emergent condition) X- 1 10 Simple Discharge Coordination []  - 0 Complex (extensive) Discharge Coordination PROCESS - Special Needs []  - Pediatric / Minor Patient Management 0 []  - 0 Isolation Patient Management []  - 0 Hearing / Language / Visual special needs []  - 0 Assessment of Community assistance (transportation, D/C planning, etc.) []  - 0 Additional assistance / Altered mentation []  - 0 Support Surface(s) Assessment (bed, cushion, seat, etc.) INTERVENTIONS - Wound Cleansing / Measurement [name], [name]. ([id number]) X- 1 5 Simple Wound Cleansing - one wound []  - 0 Complex Wound Cleansing - multiple wounds X- 1 5 Wound Imaging (photographs - any number of wounds) []  - 0 Wound Tracing (instead of photographs) X- 1 5 Simple Wound Measurement - one wound []  - 0 Complex Wound Measurement -  multiple wounds INTERVENTIONS - Wound Dressings []  - Small Wound Dressing one or multiple wounds 0 X- 1 15 Medium Wound Dressing one or  [name], [name] ([id number]) Visit Report for 06/15/2021 Arrival Information Details Patient Name: [name], [name] Date of Service: 06/15/2021 8:15 AM Medical Record Number: [id number] Patient Account Number: [id number] Date of Birth/Sex: Feb 25, 1956 (65 y.o. F) Treating RN: Dolan Amen Primary Care Fayola Meckes: Elsie Stain Other Clinician: Referring Ammy Lienhard: Elsie Stain Treating Zakariah Dejarnette/Extender: Skipper Cliche in Treatment: 3 Visit Information History Since Last Visit Pain Present Now: No Patient Arrived: Walker Arrival Time: 08:22 Accompanied By: self Transfer Assistance: None Patient Identification Verified: Yes Secondary Verification Process Completed: Yes Patient Has Alerts: Yes Patient Alerts: Patient on Blood Thinner ***PLAVIX*** Type II Diabetic HAS PACEMAKER Electronic Signature(s) Signed: 06/15/2021 4:22:57 PM By: Dolan Amen RN Entered By: Dolan Amen on 06/15/2021 08:22:34 [name] ([id number]) -------------------------------------------------------------------------------- Clinic Level of Care Assessment Details Patient Name: [name] Date of Service: 06/15/2021 8:15 AM Medical Record Number: [id number] Patient Account Number: [id number] Date of Birth/Sex: 1956-06-29 (65 y.o. F) Treating RN: Dolan Amen Primary Care Abdullahi Vallone: Elsie Stain Other Clinician: Referring Krisandra Bueno: Elsie Stain Treating Ieasha Boerema/Extender: Skipper Cliche in Treatment: 3 Clinic Level of Care Assessment Items TOOL 4 Quantity Score X - Use when only an EandM is performed on FOLLOW-UP visit 1 0 ASSESSMENTS - Nursing Assessment / Reassessment X - Reassessment of Co-morbidities (includes updates in patient status) 1 10 X- 1 5 Reassessment of Adherence to Treatment Plan ASSESSMENTS - Wound and Skin Assessment / Reassessment X - Simple Wound Assessment / Reassessment - one wound 1 5 []  - 0 Complex Wound Assessment / Reassessment - multiple  wounds []  - 0 Dermatologic / Skin Assessment (not related to wound area) ASSESSMENTS - Focused Assessment []  - Circumferential Edema Measurements - multi extremities 0 []  - 0 Nutritional Assessment / Counseling / Intervention []  - 0 Lower Extremity Assessment (monofilament, tuning fork, pulses) []  - 0 Peripheral Arterial Disease Assessment (using hand held doppler) ASSESSMENTS - Ostomy and/or Continence Assessment and Care []  - Incontinence Assessment and Management 0 []  - 0 Ostomy Care Assessment and Management (repouching, etc.) PROCESS - Coordination of Care X - Simple Patient / Family Education for ongoing care 1 15 []  - 0 Complex (extensive) Patient / Family Education for ongoing care []  - 0 Staff obtains Programmer, systems, Records, Test Results / Process Orders []  - 0 Staff telephones HHA, Nursing Homes / Clarify orders / etc []  - 0 Routine Transfer to another Facility (non-emergent condition) []  - 0 Routine Hospital Admission (non-emergent condition) []  - 0 New Admissions / Biomedical engineer / Ordering NPWT, Apligraf, etc. []  - 0 Emergency Hospital Admission (emergent condition) X- 1 10 Simple Discharge Coordination []  - 0 Complex (extensive) Discharge Coordination PROCESS - Special Needs []  - Pediatric / Minor Patient Management 0 []  - 0 Isolation Patient Management []  - 0 Hearing / Language / Visual special needs []  - 0 Assessment of Community assistance (transportation, D/C planning, etc.) []  - 0 Additional assistance / Altered mentation []  - 0 Support Surface(s) Assessment (bed, cushion, seat, etc.) INTERVENTIONS - Wound Cleansing / Measurement [name], [name]. ([id number]) X- 1 5 Simple Wound Cleansing - one wound []  - 0 Complex Wound Cleansing - multiple wounds X- 1 5 Wound Imaging (photographs - any number of wounds) []  - 0 Wound Tracing (instead of photographs) X- 1 5 Simple Wound Measurement - one wound []  - 0 Complex Wound Measurement -  multiple wounds INTERVENTIONS - Wound Dressings []  - Small Wound Dressing one or multiple wounds 0 X- 1 15 Medium Wound Dressing one or  Methods: Explain/Verbal Responses: State content correctly Electronic Signature(s) Signed: 06/15/2021 4:22:57 PM By: Dolan Amen RN Entered By: Dolan Amen on 06/15/2021 08:57:07 [name] ([id number]) -------------------------------------------------------------------------------- Wound Assessment Details Patient Name: [name] Date of Service: 06/15/2021 8:15 AM Medical Record  Number: [id number] Patient Account Number: [id number] Date of Birth/Sex: 1956-02-08 (65 y.o. F) Treating RN: Dolan Amen Primary Care Wannetta Langland: Elsie Stain Other Clinician: Referring Sameen Leas: Elsie Stain Treating Beryl Hornberger/Extender: Jeri Cos Weeks in Treatment: 3 Wound Status Wound Number: 10 Primary Dehisced Wound Etiology: Wound Location: Left, Dorsal Foot Wound Open Wounding Event: Surgical Injury Status: Date Acquired: 12/05/2020 Comorbid Anemia, Arrhythmia, Congestive Heart Failure, Weeks Of Treatment: 3 History: Hypertension, Myocardial Infarction, Type II Diabetes, End Clustered Wound: No Stage Renal Disease, Osteoarthritis, Osteomyelitis, Neuropathy, Seizure Disorder Photos Wound Measurements Length: (cm) 0.2 Width: (cm) 0.2 Depth: (cm) 2.2 Area: (cm) 0.031 Volume: (cm) 0.069 % Reduction in Area: 73.7% % Reduction in Volume: 77.5% Epithelialization: None Tunneling: No Undermining: No Wound Description Classification: Full Thickness Without Exposed Support Structu Exudate Amount: Medium Exudate Type: Serosanguineous Exudate Color: red, brown res Foul Odor After Cleansing: No Slough/Fibrino No Wound Bed Granulation Amount: None Present (0%) Exposed Structure Necrotic Amount: None Present (0%) Fascia Exposed: No Fat Layer (Subcutaneous Tissue) Exposed: Yes Tendon Exposed: No Muscle Exposed: No Joint Exposed: No Bone Exposed: No Assessment Notes Maceration noted preiwound Treatment Notes Wound #10 (Foot) Wound Laterality: Dorsal, Left Cleanser Normal Saline [name], [name] ([id number]) Discharge Instruction: Wash your hands with soap and water. Remove old dressing, discard into plastic bag and place into trash. Cleanse the wound with Normal Saline prior to applying a clean dressing using gauze sponges, not tissues or cotton balls. Do not scrub or use excessive force. Pat dry using gauze sponges, not tissue or cotton balls. Peri-Wound  Care Topical Primary Dressing Hydrofera Blue Classic Foam Rope Dressing, 9x6 (mm/in) Discharge Instruction: Cut about 2.5 cm in length, cut into 4ths-pack in wound and moisten with a couple drops of saline AFTER hydrofera is in (extra sent with patient) Secondary Dressing Mepilex Border Flex, 4x4 (in/in) Discharge Instruction: Apply to wound as directed. Do not cut. Secured With Compression Wrap Compression Stockings Add-Ons Electronic Signature(s) Signed: 06/15/2021 4:22:57 PM By: Dolan Amen RN Entered By: Dolan Amen on 06/15/2021 08:29:42 [name] ([id number]) -------------------------------------------------------------------------------- Wound Assessment Details Patient Name: [name] Date of Service: 06/15/2021 8:15 AM Medical Record Number: [id number] Patient Account Number: [id number] Date of Birth/Sex: 05-19-1956 (65 y.o. F) Treating RN: Dolan Amen Primary Care Laiba Fuerte: Elsie Stain Other Clinician: Referring Alantra Popoca: Elsie Stain Treating Iokepa Geffre/Extender: Jeri Cos Weeks in Treatment: 3 Wound Status Wound Number: 9 Primary Trauma, Other Etiology: Wound Location: Left Lower Leg Wound Open Wounding Event: Trauma Status: Date Acquired: 02/04/2021 Comorbid Anemia, Arrhythmia, Congestive Heart Failure, Weeks Of Treatment: 3 History: Hypertension, Myocardial Infarction, Type II Diabetes, End Clustered Wound: No Stage Renal Disease, Osteoarthritis, Osteomyelitis, Neuropathy, Seizure Disorder Photos Wound Measurements Length: (cm) 0 Width: (cm) 0 Depth: (cm) 0 Area: (cm) 0 Volume: (cm) 0 % Reduction in Area: 100% % Reduction in Volume: 100% Epithelialization: Large (67-100%) Tunneling: No Undermining: No Wound Description Classification: Full Thickness Without Exposed Support Structure Wound Margin: Flat and Intact Exudate Amount: None Present s Foul Odor After Cleansing: No Slough/Fibrino No Wound Bed Granulation  Amount: None Present (0%) Exposed Structure Necrotic Amount: None Present (0%) Fascia Exposed: No Fat Layer (Subcutaneous Tissue) Exposed: No Tendon Exposed: No Muscle Exposed: No Joint Exposed: No Bone  Methods: Explain/Verbal Responses: State content correctly Electronic Signature(s) Signed: 06/15/2021 4:22:57 PM By: Dolan Amen RN Entered By: Dolan Amen on 06/15/2021 08:57:07 [name] ([id number]) -------------------------------------------------------------------------------- Wound Assessment Details Patient Name: [name] Date of Service: 06/15/2021 8:15 AM Medical Record  Number: [id number] Patient Account Number: [id number] Date of Birth/Sex: 1956-02-08 (65 y.o. F) Treating RN: Dolan Amen Primary Care Wannetta Langland: Elsie Stain Other Clinician: Referring Sameen Leas: Elsie Stain Treating Beryl Hornberger/Extender: Jeri Cos Weeks in Treatment: 3 Wound Status Wound Number: 10 Primary Dehisced Wound Etiology: Wound Location: Left, Dorsal Foot Wound Open Wounding Event: Surgical Injury Status: Date Acquired: 12/05/2020 Comorbid Anemia, Arrhythmia, Congestive Heart Failure, Weeks Of Treatment: 3 History: Hypertension, Myocardial Infarction, Type II Diabetes, End Clustered Wound: No Stage Renal Disease, Osteoarthritis, Osteomyelitis, Neuropathy, Seizure Disorder Photos Wound Measurements Length: (cm) 0.2 Width: (cm) 0.2 Depth: (cm) 2.2 Area: (cm) 0.031 Volume: (cm) 0.069 % Reduction in Area: 73.7% % Reduction in Volume: 77.5% Epithelialization: None Tunneling: No Undermining: No Wound Description Classification: Full Thickness Without Exposed Support Structu Exudate Amount: Medium Exudate Type: Serosanguineous Exudate Color: red, brown res Foul Odor After Cleansing: No Slough/Fibrino No Wound Bed Granulation Amount: None Present (0%) Exposed Structure Necrotic Amount: None Present (0%) Fascia Exposed: No Fat Layer (Subcutaneous Tissue) Exposed: Yes Tendon Exposed: No Muscle Exposed: No Joint Exposed: No Bone Exposed: No Assessment Notes Maceration noted preiwound Treatment Notes Wound #10 (Foot) Wound Laterality: Dorsal, Left Cleanser Normal Saline [name], [name] ([id number]) Discharge Instruction: Wash your hands with soap and water. Remove old dressing, discard into plastic bag and place into trash. Cleanse the wound with Normal Saline prior to applying a clean dressing using gauze sponges, not tissues or cotton balls. Do not scrub or use excessive force. Pat dry using gauze sponges, not tissue or cotton balls. Peri-Wound  Care Topical Primary Dressing Hydrofera Blue Classic Foam Rope Dressing, 9x6 (mm/in) Discharge Instruction: Cut about 2.5 cm in length, cut into 4ths-pack in wound and moisten with a couple drops of saline AFTER hydrofera is in (extra sent with patient) Secondary Dressing Mepilex Border Flex, 4x4 (in/in) Discharge Instruction: Apply to wound as directed. Do not cut. Secured With Compression Wrap Compression Stockings Add-Ons Electronic Signature(s) Signed: 06/15/2021 4:22:57 PM By: Dolan Amen RN Entered By: Dolan Amen on 06/15/2021 08:29:42 [name] ([id number]) -------------------------------------------------------------------------------- Wound Assessment Details Patient Name: [name] Date of Service: 06/15/2021 8:15 AM Medical Record Number: [id number] Patient Account Number: [id number] Date of Birth/Sex: 05-19-1956 (65 y.o. F) Treating RN: Dolan Amen Primary Care Laiba Fuerte: Elsie Stain Other Clinician: Referring Alantra Popoca: Elsie Stain Treating Iokepa Geffre/Extender: Jeri Cos Weeks in Treatment: 3 Wound Status Wound Number: 9 Primary Trauma, Other Etiology: Wound Location: Left Lower Leg Wound Open Wounding Event: Trauma Status: Date Acquired: 02/04/2021 Comorbid Anemia, Arrhythmia, Congestive Heart Failure, Weeks Of Treatment: 3 History: Hypertension, Myocardial Infarction, Type II Diabetes, End Clustered Wound: No Stage Renal Disease, Osteoarthritis, Osteomyelitis, Neuropathy, Seizure Disorder Photos Wound Measurements Length: (cm) 0 Width: (cm) 0 Depth: (cm) 0 Area: (cm) 0 Volume: (cm) 0 % Reduction in Area: 100% % Reduction in Volume: 100% Epithelialization: Large (67-100%) Tunneling: No Undermining: No Wound Description Classification: Full Thickness Without Exposed Support Structure Wound Margin: Flat and Intact Exudate Amount: None Present s Foul Odor After Cleansing: No Slough/Fibrino No Wound Bed Granulation  Amount: None Present (0%) Exposed Structure Necrotic Amount: None Present (0%) Fascia Exposed: No Fat Layer (Subcutaneous Tissue) Exposed: No Tendon Exposed: No Muscle Exposed: No Joint Exposed: No Bone

## 2021-06-15 NOTE — Progress Notes (Addendum)
SOPHINA, MITTEN ([id number]) Visit Report for 06/15/2021 Chief Complaint Document Details Patient Name: [name], CUBERO Date of Service: 06/15/2021 8:15 AM Medical Record Number: [id number] Patient Account Number: [id number] Date of Birth/Sex: Mar 27, 1956 (65 y.o. F) Treating RN: Dolan Amen Primary Care Provider: Elsie Stain Other Clinician: Referring Provider: Elsie Stain Treating Provider/Extender: Skipper Cliche in Treatment: 3 Information Obtained from: Patient Chief Complaint Left LE and Left Foot Ulcer Electronic Signature(s) Signed: 06/15/2021 8:46:09 AM By: Worthy Keeler PA-C Entered By: Worthy Keeler on 06/15/2021 08:46:09 [name] ([id number]) -------------------------------------------------------------------------------- HPI Details Patient Name: [name] Date of Service: 06/15/2021 8:15 AM Medical Record Number: [id number] Patient Account Number: [id number] Date of Birth/Sex: Aug 22, 1956 (65 y.o. F) Treating RN: Dolan Amen Primary Care Provider: Elsie Stain Other Clinician: Referring Provider: Elsie Stain Treating Provider/Extender: Skipper Cliche in Treatment: 3 History of Present Illness HPI Description: 65 year old patient who is known to have diabetes for several years and generally has it under good control last hemoglobin A1c was 6.7 done 2 weeks ago. She has several other comorbidities including acute CHF, hypoxia, hyperlipidemia, systolic heart failure, myocardial infarction, coronary artery disease, iron deficiency anemia, renal insufficiency, essential hypertension, morbid obesity and obstructive sleep apnea. 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 In the past she's had a coronary angioplasty with stent in 2013, laparoscopic cholecystectomy in 2011, bilateral oophorectomy in April 2012 and she's had some lumbar disc surgery in 1996. She says she's had a lot of  peripheral neuropathy and due to this she's had a ulcer on her left second toe for a while. She had a dry eschar on her left heel and recently this was removed and she found that she had some infection and some drainage from there. She has minimal pain around the heel but no pain on any areas of her toes. she has not been on any antibiotics recently and has known vascular problems in the recent past. Readmission: 05/22/2021 upon evaluation today patient appears to be doing somewhat poorly in regard to the wound that is present on her left lateral lower leg as well as the left dorsal foot. The foot is very minimal and seems to be doing decently well the leg is a little bit worse. The foot is been present since about March 1 and the leg since about May 1. Subsequently the patient tells me that this is due to a trauma. She does have diabetes. With that being said she also has chronic kidney disease stage IV, congestive heart failure, and hypertension. She tells me that this is actually gotten significantly better since the initial injury. With that being said there still is a significant issue here that we do need to try to get under control. The patient voiced understanding. Nonetheless I do believe that this is going to require regular wound care here in the clinic with [place]. Patient's wounds are going also require some sharp debridement to clear away some of the necrotic tissue from both locations and I think this is absolutely appropriate based on what we are seeing today I think it would also help with getting things moving in a much better direction in general. 05/31/2021 upon evaluation today patient actually appears to be doing excellent in regard to her wounds. I am actually very pleased with where things stand today. There is no signs of active infection at this time. No fevers, chills, nausea, vomiting, or diarrhea. 06/08/2021 upon evaluation today patient  appears to be doing well with regard to her  wound. On the leg especially this is very close to complete resolution though is getting really dry at this point I think we can use some Xeroform to try to see if that would be of benefit. She is in agreement with that plan. In regard to the foot this is showing signs of improvement although it still taking some time or doing the [facility] Blue rope packing. 06/15/2021 upon evaluation today patient appears to be doing well with regard to her wounds. The leg is healed and the foot is showing signs of improvement. There does not appear to be any evidence of active infection at this time. No fevers, chills, nausea, vomiting, or diarrhea. Electronic Signature(s) Signed: 06/15/2021 8:54:07 AM By: Worthy Keeler PA-C Entered By: Worthy Keeler on 06/15/2021 08:54:07 [name] ([id number]) -------------------------------------------------------------------------------- Physical Exam Details Patient Name: [name] Date of Service: 06/15/2021 8:15 AM Medical Record Number: [id number] Patient Account Number: [id number] Date of Birth/Sex: Jan 13, 1956 (65 y.o. F) Treating RN: Dolan Amen Primary Care Provider: Elsie Stain Other Clinician: Referring Provider: Elsie Stain Treating Provider/Extender: Jeri Cos Weeks in Treatment: 3 Constitutional Well-nourished and well-hydrated in no acute distress. Respiratory normal breathing without difficulty. Psychiatric this patient is able to make decisions and demonstrates good insight into disease process. Alert and Oriented x 3. pleasant and cooperative. Notes Upon inspection patient's wound bed showed signs of good granulation epithelization and again the leg is completely healed and the foot though not healed is doing significantly better. Overall I am extremely pleased with where we stand at this point. Electronic Signature(s) Signed: 06/15/2021 8:54:23 AM By: Worthy Keeler PA-C Entered By: Worthy Keeler on 06/15/2021  08:54:22 [name] ([id number]) -------------------------------------------------------------------------------- Physician Orders Details Patient Name: [name] Date of Service: 06/15/2021 8:15 AM Medical Record Number: [id number] Patient Account Number: [id number] Date of Birth/Sex: [birth year] (65 y.o. 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 [facility] for wound care. May utilize formulary equivalent dressing for wound treatment orders unless otherwise specified. Home Health Nurse may visit PRN to address patientos wound care needs. o Scheduled days for dressing changes to be completed; exception, patient has scheduled wound care visit that day. o **Please direct any NON-WOUND related issues/requests for orders to patient's Primary Care Physician. **If current dressing causes regression in wound condition, may D/C ordered dressing product/s and apply Normal Saline Moist Dressing daily until next [place] or Other MD appointment. **Notify [facility] of regression in wound condition at [phone]. Bathing/ Shower/ Hygiene o May shower; gently cleanse wound with antibacterial soap, rinse and pat dry prior to dressing wounds Wound  Treatment Wound #10 - Foot Wound Laterality: Dorsal, Left Cleanser: Normal Saline 3 x Per Week/30 Days Discharge Instructions: Wash your hands with soap and water. Remove old dressing, discard into plastic bag and place into trash. Cleanse the  wound with Normal Saline prior to applying a clean dressing using gauze sponges, not tissues or cotton balls. Do not scrub or use excessive force. Pat dry using gauze sponges, not tissue or cotton balls. Primary Dressing: Hydrofera Blue Classic Foam Rope Dressing, 9x6 (mm/in) 3 x Per Week/30 Days Discharge Instructions: Cut about 2.5 cm in length, cut into 4ths-pack in wound and moisten with a couple drops of saline AFTER hydrofera is in (extra sent with patient) Secondary Dressing: Mepilex Border Flex, 4x4 (in/in) 3 x Per Week/30 Days Discharge Instructions: Apply to wound as directed. Do not cut. Electronic Signature(s) Signed: 06/15/2021 11:43:44 AM By: Worthy Keeler PA-C Signed: 06/15/2021 4:22:57 PM By: Dolan Amen RN Entered By: Dolan Amen on 06/15/2021 08:48:41 [name] ([id number]) -------------------------------------------------------------------------------- Problem List Details Patient Name: [name] Date of Service: 06/15/2021 8:15 AM Medical Record Number: [id number] Patient Account Number: [id number] Date of Birth/Sex: [birth year] (65 y.o. F) Treating RN: Dolan Amen Primary Care Provider: Elsie Stain Other Clinician: Referring Provider: Elsie Stain Treating Provider/Extender: Skipper Cliche in Treatment: 3 Active Problems ICD-10 Encounter Code Description Active Date MDM Diagnosis E11.621 Type 2 diabetes mellitus with foot ulcer 05/22/2021 No Yes L97.522 Non-pressure chronic ulcer of other part of left foot with fat layer 05/22/2021 No Yes exposed S81.812A Laceration without foreign body, left lower leg, initial encounter 05/22/2021 No Yes N18.4 Chronic kidney disease, stage 4 (severe) 05/22/2021  No Yes I50.42 Chronic combined systolic (congestive) and diastolic (congestive) heart 05/22/2021 No Yes failure I10 Essential (primary) hypertension 05/22/2021 No Yes Inactive Problems Resolved Problems Electronic Signature(s) Signed: 06/15/2021 8:46:05 AM By: Worthy Keeler PA-C Entered By: Worthy Keeler on 06/15/2021 08:46:04 [name] ([id number]) -------------------------------------------------------------------------------- Progress Note Details Patient Name: [name] Date of Service: 06/15/2021 8:15 AM Medical Record Number: [id number] Patient Account Number: [id number] Date of Birth/Sex: Apr 09, 1956 (65 y.o. F) Treating RN: Dolan Amen Primary Care Provider: Elsie Stain Other Clinician: Referring Provider: Elsie Stain Treating Provider/Extender: Skipper Cliche in Treatment: 3 Subjective Chief Complaint Information obtained from Patient Left LE and Left Foot Ulcer History of Present Illness (HPI) 65 year old patient who is known to have diabetes for several years and generally has it under good control last hemoglobin A1c was 6.7 done 2 weeks ago. She has several other comorbidities including acute CHF, hypoxia, hyperlipidemia, systolic heart failure, myocardial infarction, coronary artery disease, iron deficiency anemia, renal insufficiency, essential hypertension, morbid obesity and obstructive sleep apnea. 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 In the past she's had a coronary angioplasty with stent in 2013, laparoscopic cholecystectomy in 2011, bilateral oophorectomy in April 2012 and she's had some lumbar disc surgery in 1996. She says she's had a lot of peripheral neuropathy and due to this she's had a ulcer on her left second toe for a while. She had a dry eschar on her left heel and recently this was removed and she found that she had some infection and some drainage from there. She has  minimal pain around the heel but no pain on any areas of her toes. she has not been on any antibiotics recently and has known vascular problems in the recent past. Readmission: 05/22/2021 upon evaluation today patient appears to be doing somewhat poorly in regard to the wound that is present on her left lateral lower leg as well as the left dorsal foot. The foot is very minimal and seems to be doing decently well the  leg is a little bit worse. The foot is been present since about March 1 and the leg since about May 1. Subsequently the patient tells me that this is due to a trauma. She does have diabetes. With that being said she also has chronic kidney disease stage IV, congestive heart failure, and hypertension. She tells me that this is actually gotten significantly better since the initial injury. With that being said there still is a significant issue here that we do need to try to get under control. The patient voiced understanding. Nonetheless I do believe that this is going to require regular wound care here in the clinic with [place]. Patient's wounds are going also require some sharp debridement to clear away some of the necrotic tissue from both locations and I think this is absolutely appropriate based on what we are seeing today I think it would also help with getting things moving in a much better direction in general. 05/31/2021 upon evaluation today patient actually appears to be doing excellent in regard to her wounds. I am actually very pleased with where things stand today. There is no signs of active infection at this time. No fevers, chills, nausea, vomiting, or diarrhea. 06/08/2021 upon evaluation today patient appears to be doing well with regard to her wound. On the leg especially this is very close to complete resolution though is getting really dry at this point I think we can use some Xeroform to try to see if that would be of benefit. She is in agreement with that plan. In regard to  the foot this is showing signs of improvement although it still taking some time or doing the [facility] Blue rope packing. 06/15/2021 upon evaluation today patient appears to be doing well with regard to her wounds. The leg is healed and the foot is showing signs of improvement. There does not appear to be any evidence of active infection at this time. No fevers, chills, nausea, vomiting, or diarrhea. Objective Constitutional Well-nourished and well-hydrated in no acute distress. Vitals Time Taken: 8:22 AM, Height: 68 in, Weight: 278 lbs, BMI: 42.3, Temperature: 97.9 F, Pulse: 56 bpm, Respiratory Rate: 18 breaths/min, Blood Pressure: 128/72 mmHg. Respiratory normal breathing without difficulty. Psychiatric this patient is able to make decisions and demonstrates good insight into disease process. Alert and Oriented x 3. pleasant and cooperative. General Notes: Upon inspection patient's wound bed showed signs of good granulation epithelization and again the leg is completely healed and the foot though not healed is doing significantly better. Overall I am extremely pleased with where we stand at this point. KENNESHIA, REHM ([id number]) Integumentary (Hair, Skin) Wound #10 status is Open. Original cause of wound was Surgical Injury. The date acquired was: 12/05/2020. The wound has been in treatment 3 weeks. The wound is located on the Left,Dorsal Foot. The wound measures 0.2cm length x 0.2cm width x 2.2cm depth; 0.031cm^2 area and 0.069cm^3 volume. There is Fat Layer (Subcutaneous Tissue) exposed. There is no tunneling or undermining noted. There is a medium amount of serosanguineous drainage noted. There is no granulation within the wound bed. There is no necrotic tissue within the wound bed. General Notes: Maceration noted preiwound Wound #9 status is Open. Original cause of wound was Trauma. The date acquired was: 02/04/2021. The wound has been in treatment 3 weeks. The wound is located on the  Left Lower Leg. The wound measures 0cm length x 0cm width x 0cm depth; 0cm^2 area and 0cm^3 volume. There is no tunneling or  undermining noted. There is a none present amount of drainage noted. The wound margin is flat and intact. There is no granulation within the wound bed. There is no necrotic tissue within the wound bed. Assessment Active Problems ICD-10 Type 2 diabetes mellitus with foot ulcer Non-pressure chronic ulcer of other part of left foot with fat layer exposed Laceration without foreign body, left lower leg, initial encounter Chronic kidney disease, stage 4 (severe) Chronic combined systolic (congestive) and diastolic (congestive) heart failure Essential (primary) hypertension Plan Follow-up Appointments: Return Appointment in 1 week. Home Health: [facility]: - [place] for wound care. May utilize formulary equivalent dressing for wound treatment orders unless otherwise specified. Home Health Nurse may visit PRN to address patient s wound care needs. Scheduled days for dressing changes to be completed; exception, patient has scheduled wound care visit that day. **Please direct any NON-WOUND related issues/requests for orders to patient's Primary Care Physician. **If current dressing causes regression in wound condition, may D/C ordered dressing product/s and apply Normal Saline Moist Dressing daily until next [place] or Other MD appointment. **Notify [facility] of regression in wound condition at [phone]. Bathing/ Shower/ Hygiene: May shower; gently cleanse wound with antibacterial soap, rinse and pat dry prior to dressing wounds WOUND #10: - Foot Wound Laterality: Dorsal, Left Cleanser: Normal Saline 3 x Per Week/30 Days Discharge Instructions: Wash your hands with soap and water. Remove old dressing, discard into plastic bag and place into trash. Cleanse the wound with Normal Saline prior to applying a clean dressing  using gauze sponges, not tissues or cotton balls. Do not scrub or use excessive force. Pat dry using gauze sponges, not tissue or cotton balls. Primary Dressing: Hydrofera Blue Classic Foam Rope Dressing, 9x6 (mm/in) 3 x Per Week/30 Days Discharge Instructions: Cut about 2.5 cm in length, cut into 4ths-pack in wound and moisten with a couple drops of saline AFTER hydrofera is in (extra sent with patient) Secondary Dressing: Mepilex Border Flex, 4x4 (in/in) 3 x Per Week/30 Days Discharge Instructions: Apply to wound as directed. Do not cut. 1. Would recommend that we going continue with the wound care measures as before and the patient is in agreement with that plan. This includes the use of the Hydrofera Blue rope to the foot which I think is doing a great job. 2. I am also can recommend with the patient going to continue with the dressing changes 3 times per week I think that is an all some way to go with this Cut the right much smaller than we have been. 3. Muscle can recommend the patient continue to monitor for any signs of worsening or infection if anything changes she should let me know soon as possible. We will see patient back for reevaluation in 1 week here in the clinic. If anything worsens or changes patient will contact our office for additional recommendations. Electronic Signature(s) Signed: 06/15/2021 8:55:08 AM By: Maple Hudson, Van Clines ([id number]) Entered By: Worthy Keeler on 06/15/2021 08:55:08 [name] ([id number]) -------------------------------------------------------------------------------- SuperBill Details Patient Name: [name] Date of Service: 06/15/2021 Medical Record Number: [id number] Patient Account Number: [id number] Date of Birth/Sex: [birth year] (65 y.o. F) Treating RN: Dolan Amen Primary Care Provider: Elsie Stain Other Clinician: Referring Provider: Elsie Stain Treating Provider/Extender: Jeri Cos Weeks  in Treatment: 3 Diagnosis Coding ICD-10 Codes Code Description [phone] Type 2 diabetes mellitus with foot ulcer L97.522 Non-pressure chronic ulcer of other part of left  foot with fat layer exposed S81.812A Laceration without foreign body, left lower leg, initial encounter N18.4 Chronic kidney disease, stage 4 (severe) I50.42 Chronic combined systolic (congestive) and diastolic (congestive) heart failure I10 Essential (primary) hypertension Facility Procedures CPT4 Code: [id number] Description: 99213 - WOUND CARE VISIT-LEV 3 EST PT Modifier: Quantity: 1 Physician Procedures CPT4 Code: [id number] Description: 08676 - WC PHYS LEVEL 4 - EST PT Modifier: Quantity: 1 CPT4 Code: Description: ICD-10 Diagnosis Description E11.621 Type 2 diabetes mellitus with foot ulcer L97.522 Non-pressure chronic ulcer of other part of left foot with fat layer ex P95.093O Laceration without foreign body, left lower leg, initial encounter N18.4  Chronic kidney disease, stage 4 (severe) Modifier: posed Quantity: Electronic Signature(s) Signed: 06/15/2021 11:43:44 AM By: Worthy Keeler PA-C Signed: 06/15/2021 4:22:57 PM By: Dolan Amen RN Previous Signature: 06/15/2021 8:55:26 AM Version By: Worthy Keeler PA-C Entered By: Dolan Amen on 06/15/2021 08:56:53

## 2021-06-18 ENCOUNTER — Encounter (HOSPITAL_COMMUNITY): Payer: HMO

## 2021-06-22 ENCOUNTER — Other Ambulatory Visit: Payer: Self-pay

## 2021-06-22 ENCOUNTER — Encounter: Payer: HMO | Admitting: Physician Assistant

## 2021-06-22 DIAGNOSIS — E11621 Type 2 diabetes mellitus with foot ulcer: Secondary | ICD-10-CM | POA: Diagnosis not present

## 2021-06-22 DIAGNOSIS — T8189XA Other complications of procedures, not elsewhere classified, initial encounter: Secondary | ICD-10-CM | POA: Diagnosis not present

## 2021-06-22 NOTE — Progress Notes (Signed)
Muscle: No Joint: No Bone: No Epithelialization: Medium (34-66%) N/A N/A Treatment Notes Electronic Signature(s) Signed: 06/22/2021 4:40:04 PM By: Vickie Amen RN Entered By: [name] on 06/22/2021 15:59:15 [name] ([id number]) -------------------------------------------------------------------------------- [place] Details Patient Name: [name] Date of Service: 06/22/2021 3:30 PM Medical Record Number: [id number] Patient Account Number: [id number] Date of Birth/Sex: [birth year] (65 y.o. F) Treating RN: [name] Primary Care Sherrell Farish: Elsie Stain Other Clinician: Referring Synthia Fairbank: Elsie Stain Treating Anela Bensman/Extender: Skipper Cliche in Treatment: 4 Active Inactive Necrotic Tissue Nursing Diagnoses: Impaired tissue integrity related to necrotic/devitalized tissue Goals: Necrotic/devitalized tissue will be minimized in the wound bed Date Initiated: 05/22/2021 Target Resolution Date: 05/22/2021 Goal Status: Active Patient/caregiver will verbalize understanding of reason and process for debridement of necrotic tissue Date Initiated: 05/22/2021 Target Resolution Date: 05/22/2021 Goal Status: Active Interventions: Assess patient pain level pre-, during and post procedure and prior to discharge Provide education on  necrotic tissue and debridement process Treatment Activities: Apply topical anesthetic as ordered : 05/22/2021 Biologic debridement : 05/22/2021 Enzymatic debridement : 05/22/2021 Excisional debridement : 05/22/2021 Notes: Wound/Skin Impairment Nursing Diagnoses: Impaired tissue integrity Goals: Patient/caregiver will verbalize understanding of skin care regimen Date Initiated: 05/22/2021 Date Inactivated: 06/22/2021 Target Resolution Date: 05/22/2021 Goal Status: Met Ulcer/skin breakdown will have a volume reduction of 30% by week 4 Date Initiated: 05/22/2021 Date Inactivated: 06/22/2021 Target Resolution Date: 06/22/2021 Goal Status: Met Ulcer/skin breakdown will have a volume reduction of 50% by week 8 Date Initiated: 05/22/2021 Target Resolution Date: 07/22/2021 Goal Status: Active Ulcer/skin breakdown will have a volume reduction of 80% by week 12 Date Initiated: 05/22/2021 Target Resolution Date: 08/22/2021 Goal Status: Active Ulcer/skin breakdown will heal within 14 weeks Date Initiated: 05/22/2021 Target Resolution Date: 09/21/2021 Goal Status: Active Interventions: Assess patient/caregiver ability to obtain necessary supplies Assess patient/caregiver ability to perform ulcer/skin care regimen upon admission and as needed Assess ulceration(s) every visit Provide education on ulcer and skin care CLIO, GERHART ([id number]) Treatment Activities: Referred to DME Xoe Hoe for dressing supplies : 05/22/2021 Skin care regimen initiated : 05/22/2021 Notes: Electronic Signature(s) Signed: 06/22/2021 4:40:04 PM By: Vickie Amen RN Entered By: [name] on 06/22/2021 15:59:09 [name] ([id number]) -------------------------------------------------------------------------------- Pain Assessment Details Patient Name: [name] Date of Service: 06/22/2021 3:30 PM Medical Record Number: [id number] Patient Account Number: [id number] Date of Birth/Sex:  April 07, 1956 (65 y.o. F) Treating RN: [name] Primary Care Keldan Eplin: Elsie Stain Other Clinician: Referring Lot [place]: Elsie Stain Treating Ferlin Fairhurst/Extender: Skipper Cliche in Treatment: 4 Active Problems Location of Pain Severity and Description of Pain Patient Has Paino No Site Locations Rate the pain. Current Pain Level: 0 Pain Management and Medication Current Pain Management: Electronic Signature(s) Signed: 06/22/2021 4:40:04 PM By: Vickie Amen RN Entered By: [name] on 06/22/2021 15:44:58 [name] ([id number]) -------------------------------------------------------------------------------- Patient/Caregiver Education Details Patient Name: [name] Date of Service: 06/22/2021 3:30 PM Medical Record Number: [id number] Patient Account Number: [id number] Date of Birth/Gender: Nov 17, 1955 (65 y.o. F) Treating RN: [name] Primary Care Physician: Elsie Stain Other Clinician: Referring Physician: Elsie Stain Treating Physician/Extender: Skipper Cliche in Treatment: 4 Education Assessment Education Provided To: Patient Education Topics Provided Wound/Skin Impairment: Methods: Explain/Verbal Responses: State content correctly Electronic Signature(s) Signed: 06/22/2021 4:40:04 PM By: Vickie Amen RN Entered By: [name] on 06/22/2021 16:33:48 [name] ([id number]) -------------------------------------------------------------------------------- Wound Assessment Details Patient Name: [name] Date of Service: 06/22/2021 3:30 PM Medical Record  multiple wounds []  - 0 Large Wound Dressing one or multiple wounds []  - 0 Application of Medications - topical []  - 0 Application of Medications - injection INTERVENTIONS - Miscellaneous []  - External ear exam 0 []  - 0 Specimen Collection (cultures, biopsies, blood, body fluids, etc.) []  - 0 Specimen(s) / Culture(s) sent or taken to Lab for analysis []  - 0 Patient Transfer (multiple staff / [employer] fast streamer / Similar devices) []  - 0 Simple Staple / Suture removal (25 or less) []  - 0 Complex Staple / Suture removal (26 or more) []  - 0 Hypo / Hyperglycemic Management (close monitor of Blood Glucose) []  - 0 Ankle / Brachial Index (ABI) - do not check if billed separately X- 1 5 Vital Signs Has the patient been seen at the hospital within the last three years: Yes Total Score: 80 Level Of Care: New/Established - Level 3 Electronic Signature(s) Signed: 06/22/2021 4:40:04 PM By: Vickie Amen RN Entered By: [name] on 06/22/2021 16:33:22 [name] ([id number]) -------------------------------------------------------------------------------- Encounter Discharge Information Details Patient Name: [name] Date of Service: 06/22/2021 3:30 PM Medical Record Number: [id number] Patient Account Number: [id number] Date of Birth/Sex: [birth year] (65 y.o. F) Treating RN: [name] Primary Care Kimball Manske: Elsie Stain Other Clinician: Referring Tonianne Fine: Elsie Stain Treating Rozanne Heumann/Extender: Skipper Cliche in Treatment: 4 Encounter Discharge Information Items Discharge Condition: Stable Ambulatory Status: Ambulatory Discharge Destination: Home Transportation: Private Auto Accompanied By: mother Schedule Follow-up Appointment: Yes Clinical Summary of Care: Electronic Signature(s) Signed: 06/22/2021  4:34:29 PM By: Vickie Amen RN Entered By: [name] on 06/22/2021 16:34:29 [name] ([id number]) -------------------------------------------------------------------------------- Lower Extremity Assessment Details Patient Name: [name] Date of Service: 06/22/2021 3:30 PM Medical Record Number: [id number] Patient Account Number: [id number] Date of Birth/Sex: [birth year] (65 y.o. F) Treating RN: [name] Primary Care Sanela Evola: Elsie Stain Other Clinician: Referring Yeslin Delio: Elsie Stain Treating Josten Warmuth/Extender: Jeri Cos Weeks in Treatment: 4 Edema Assessment Assessed: [name]: Yes] [Right: No] Edema: [Left: N] [Right: o] Vascular Assessment Pulses: Dorsalis Pedis Palpable: [Left:Yes] Electronic Signature(s) Signed: 06/22/2021 4:40:04 PM By: Vickie Amen RN Entered By: [name] on 06/22/2021 15:49:23 [name] ([id number]) -------------------------------------------------------------------------------- Multi Wound Chart Details Patient Name: [name] Date of Service: 06/22/2021 3:30 PM Medical Record Number: [id number] Patient Account Number: [id number] Date of Birth/Sex: [birth year] (65 y.o. F) Treating RN: [name] Primary Care Dreamer Carillo: Elsie Stain Other Clinician: Referring Aina Rossbach: Elsie Stain Treating Redding Cloe/Extender: Skipper Cliche in Treatment: 4 Vital Signs Height(in): 15 Pulse(bpm): 73 Weight(lbs): 93 Blood Pressure(mmHg): 153/70 Body Mass Index(BMI): 42 Temperature(F): 98.6 Respiratory Rate(breaths/min): 18 Photos: [N/A:N/A] Wound Location: Left, Dorsal Foot N/A N/A Wounding Event: Surgical Injury N/A N/A Primary Etiology: Dehisced Wound N/A N/A Comorbid History: Anemia, Arrhythmia, Congestive N/A N/A Heart Failure, Hypertension, Myocardial Infarction, Type II Diabetes, End Stage Renal Disease, Osteoarthritis, Osteomyelitis, Neuropathy, Seizure Disorder Date Acquired:  12/05/2020 N/A N/A Weeks of Treatment: 4 N/A N/A Wound Status: Open N/A N/A Measurements L x W x D (cm) 0.1x0.1x1.1 N/A N/A Area (cm) : 0.008 N/A N/A Volume (cm) : 0.009 N/A N/A % Reduction in Area: 93.20% N/A N/A % Reduction in Volume: 97.10% N/A N/A Classification: Full Thickness Without Exposed N/A N/A Support Structures Exudate Amount: Medium N/A N/A Exudate Type: Serosanguineous N/A N/A Exudate Color: red, brown N/A N/A Granulation Amount: None Present (0%) N/A N/A Necrotic Amount: None Present (0%) N/A N/A Exposed Structures: Fat Layer (Subcutaneous Tissue): N/A N/A Yes Fascia: No Tendon: No  multiple wounds []  - 0 Large Wound Dressing one or multiple wounds []  - 0 Application of Medications - topical []  - 0 Application of Medications - injection INTERVENTIONS - Miscellaneous []  - External ear exam 0 []  - 0 Specimen Collection (cultures, biopsies, blood, body fluids, etc.) []  - 0 Specimen(s) / Culture(s) sent or taken to Lab for analysis []  - 0 Patient Transfer (multiple staff / Civil [employer] fast streamer / Similar devices) []  - 0 Simple Staple / Suture removal (25 or less) []  - 0 Complex Staple / Suture removal (26 or more) []  - 0 Hypo / Hyperglycemic Management (close monitor of Blood Glucose) []  - 0 Ankle / Brachial Index (ABI) - do not check if billed separately X- 1 5 Vital Signs Has the patient been seen at the hospital within the last three years: Yes Total Score: 80 Level Of Care: New/Established - Level 3 Electronic Signature(s) Signed: 06/22/2021 4:40:04 PM By: Vickie Amen RN Entered By: [name] on 06/22/2021 16:33:22 [name] ([id number]) -------------------------------------------------------------------------------- Encounter Discharge Information Details Patient Name: [name] Date of Service: 06/22/2021 3:30 PM Medical Record Number: [id number] Patient Account Number: [id number] Date of Birth/Sex: [birth year] (65 y.o. F) Treating RN: [name] Primary Care Kimball Manske: Elsie Stain Other Clinician: Referring Tonianne Fine: Elsie Stain Treating Rozanne Heumann/Extender: Skipper Cliche in Treatment: 4 Encounter Discharge Information Items Discharge Condition: Stable Ambulatory Status: Ambulatory Discharge Destination: Home Transportation: Private Auto Accompanied By: mother Schedule Follow-up Appointment: Yes Clinical Summary of Care: Electronic Signature(s) Signed: 06/22/2021  4:34:29 PM By: Vickie Amen RN Entered By: [name] on 06/22/2021 16:34:29 [name] ([id number]) -------------------------------------------------------------------------------- Lower Extremity Assessment Details Patient Name: [name] Date of Service: 06/22/2021 3:30 PM Medical Record Number: [id number] Patient Account Number: [id number] Date of Birth/Sex: [birth year] (65 y.o. F) Treating RN: [name] Primary Care Sanela Evola: Elsie Stain Other Clinician: Referring Yeslin Delio: Elsie Stain Treating Josten Warmuth/Extender: Jeri Cos Weeks in Treatment: 4 Edema Assessment Assessed: [name]: Yes] [Right: No] Edema: [Left: N] [Right: o] Vascular Assessment Pulses: Dorsalis Pedis Palpable: [Left:Yes] Electronic Signature(s) Signed: 06/22/2021 4:40:04 PM By: Vickie Amen RN Entered By: [name] on 06/22/2021 15:49:23 [name] ([id number]) -------------------------------------------------------------------------------- Multi Wound Chart Details Patient Name: [name] Date of Service: 06/22/2021 3:30 PM Medical Record Number: [id number] Patient Account Number: [id number] Date of Birth/Sex: [birth year] (65 y.o. F) Treating RN: [name] Primary Care Dreamer Carillo: Elsie Stain Other Clinician: Referring Aina Rossbach: Elsie Stain Treating Redding Cloe/Extender: Skipper Cliche in Treatment: 4 Vital Signs Height(in): 15 Pulse(bpm): 73 Weight(lbs): 93 Blood Pressure(mmHg): 153/70 Body Mass Index(BMI): 42 Temperature(F): 98.6 Respiratory Rate(breaths/min): 18 Photos: [N/A:N/A] Wound Location: Left, Dorsal Foot N/A N/A Wounding Event: Surgical Injury N/A N/A Primary Etiology: Dehisced Wound N/A N/A Comorbid History: Anemia, Arrhythmia, Congestive N/A N/A Heart Failure, Hypertension, Myocardial Infarction, Type II Diabetes, End Stage Renal Disease, Osteoarthritis, Osteomyelitis, Neuropathy, Seizure Disorder Date Acquired:  12/05/2020 N/A N/A Weeks of Treatment: 4 N/A N/A Wound Status: Open N/A N/A Measurements L x W x D (cm) 0.1x0.1x1.1 N/A N/A Area (cm) : 0.008 N/A N/A Volume (cm) : 0.009 N/A N/A % Reduction in Area: 93.20% N/A N/A % Reduction in Volume: 97.10% N/A N/A Classification: Full Thickness Without Exposed N/A N/A Support Structures Exudate Amount: Medium N/A N/A Exudate Type: Serosanguineous N/A N/A Exudate Color: red, brown N/A N/A Granulation Amount: None Present (0%) N/A N/A Necrotic Amount: None Present (0%) N/A N/A Exposed Structures: Fat Layer (Subcutaneous Tissue): N/A N/A Yes Fascia: No Tendon: No  Number: [id number] Patient Account Number: [id number] Date of Birth/Sex: [birth year] (65 y.o. F) Treating RN: [name] Primary Care Arneshia Ade: Elsie Stain Other Clinician: Referring Sinai Mahany: Elsie Stain Treating Kenston Longton/Extender: Jeri Cos Weeks in Treatment: 4 Wound Status Wound Number: 10 Primary Dehisced Wound Etiology: Wound Location:  Left, Dorsal Foot Wound Open Wounding Event: Surgical Injury Status: Date Acquired: 12/05/2020 Comorbid Anemia, Arrhythmia, Congestive Heart Failure, Weeks Of Treatment: 4 History: Hypertension, Myocardial Infarction, Type II Diabetes, End Clustered Wound: No Stage Renal Disease, Osteoarthritis, Osteomyelitis, Neuropathy, Seizure Disorder Photos Wound Measurements Length: (cm) 0.1 Width: (cm) 0.1 Depth: (cm) 1.1 Area: (cm) 0.008 Volume: (cm) 0.009 % Reduction in Area: 93.2% % Reduction in Volume: 97.1% Epithelialization: Medium (34-66%) Tunneling: No Undermining: No Wound Description Classification: Full Thickness Without Exposed Support Structures Exudate Amount: Medium Exudate Type: Serosanguineous Exudate Color: red, brown Foul Odor After Cleansing: No Slough/Fibrino No Wound Bed Granulation Amount: None Present (0%) Exposed Structure Necrotic Amount: None Present (0%) Fascia Exposed: No Fat Layer (Subcutaneous Tissue) Exposed: Yes Tendon Exposed: No Muscle Exposed: No Joint Exposed: No Bone Exposed: No Treatment Notes Wound #10 (Foot) Wound Laterality: Dorsal, Left Cleanser Normal Saline Discharge Instruction: Wash your hands with soap and water. Remove old dressing, discard into plastic bag and place into trash. Cleanse the wound with Normal Saline prior to applying a clean dressing using gauze sponges, not tissues or cotton balls. Do not YAZMYN, VALBUENA. ([id number]) scrub or use excessive force. Pat dry using gauze sponges, not tissue or cotton balls. Peri-Wound Care Topical Primary Dressing Endoform 2x2 (in/in) Discharge Instruction: Cut into thin strip, moisten with saline, pack in wound using sterile tipped applicator for easier guiding-depth about 1.1 cm. Extra sent with patient Secondary Dressing Mepilex Border Flex, 4x4 (in/in) Discharge Instruction: Apply to wound as directed. Do not cut. Secured With Compression Wrap Compression  Stockings Add-Ons Electronic Signature(s) Signed: 06/22/2021 4:40:04 PM By: Vickie Amen RN Entered By: [name] on 06/22/2021 15:47:40 [name] ([id number]) -------------------------------------------------------------------------------- [place] Details Patient Name: [name] Date of Service: 06/22/2021 3:30 PM Medical Record Number: [id number] Patient Account Number: [id number] Date of Birth/Sex: Jul 23, 1956 (65 y.o. F) Treating RN: [name] Primary Care Juell Radney: Elsie Stain Other Clinician: Referring Norris Brumbach: Elsie Stain Treating Brennyn Ortlieb/Extender: Skipper Cliche in Treatment: 4 Vital Signs Time Taken: 15:42 Temperature (F): 98.6 Height (in): 68 Pulse (bpm): 65 Weight (lbs): 278 Respiratory Rate (breaths/min): 18 Body Mass Index (BMI): 42.3 Blood Pressure (mmHg): 153/70 Reference Range: 80 - 120 mg / dl Electronic Signature(s) Signed: 06/22/2021 4:40:04 PM By: Vickie Amen RN Entered By: [name] on 06/22/2021 15:42:53

## 2021-06-22 NOTE — Progress Notes (Addendum)
[name], [name] ([id number]) Visit Report for 06/22/2021 Chief Complaint Document Details Patient Name: [name], [name] Date of Service: 06/22/2021 3:30 PM Medical Record Number: [id number] Patient Account Number: [id number] Date of Birth/Sex: [birth year] (65 y.o. F) Treating RN: Dolan Amen Primary Care Provider: Elsie Stain Other Clinician: Referring Provider: Elsie Stain Treating Provider/Extender: Skipper Cliche in Treatment: 4 Information Obtained from: Patient Chief Complaint Left LE and Left Foot Ulcer Electronic Signature(s) Signed: 06/22/2021 3:46:55 PM By: Worthy Keeler PA-C Entered By: Worthy Keeler on 06/22/2021 15:46:55 [name] ([id number]) -------------------------------------------------------------------------------- HPI Details Patient Name: [name] Date of Service: 06/22/2021 3:30 PM Medical Record Number: [id number] Patient Account Number: [id number] Date of Birth/Sex: March 28, 1956 (65 y.o. F) Treating RN: Dolan Amen Primary Care Provider: Elsie Stain Other Clinician: Referring Provider: Elsie Stain Treating Provider/Extender: Skipper Cliche in Treatment: 4 History of Present Illness HPI Description: 65 year old patient who is known to have diabetes for several years and generally has it under good control last hemoglobin A1c was 6.7 done 2 weeks ago. She has several other comorbidities including acute CHF, hypoxia, hyperlipidemia, systolic heart failure, myocardial infarction, coronary artery disease, iron deficiency anemia, renal insufficiency, essential hypertension, morbid obesity and obstructive sleep apnea. 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 In the past she's had a coronary angioplasty with stent in 2013, laparoscopic cholecystectomy in 2011, bilateral oophorectomy in April 2012 and she's had some lumbar disc surgery in 1996. She says she's had a lot of  peripheral neuropathy and due to this she's had a ulcer on her left second toe for a while. She had a dry eschar on her left heel and recently this was removed and she found that she had some infection and some drainage from there. She has minimal pain around the heel but no pain on any areas of her toes. she has not been on any antibiotics recently and has known vascular problems in the recent past. Readmission: 05/22/2021 upon evaluation today patient appears to be doing somewhat poorly in regard to the wound that is present on her left lateral lower leg as well as the left dorsal foot. The foot is very minimal and seems to be doing decently well the leg is a little bit worse. The foot is been present since about March 1 and the leg since about May 1. Subsequently the patient tells me that this is due to a trauma. She does have diabetes. With that being said she also has chronic kidney disease stage IV, congestive heart failure, and hypertension. She tells me that this is actually gotten significantly better since the initial injury. With that being said there still is a significant issue here that we do need to try to get under control. The patient voiced understanding. Nonetheless I do believe that this is going to require regular wound care here in the clinic with [place]. Patient's wounds are going also require some sharp debridement to clear away some of the necrotic tissue from both locations and I think this is absolutely appropriate based on what we are seeing today I think it would also help with getting things moving in a much better direction in general. 05/31/2021 upon evaluation today patient actually appears to be doing excellent in regard to her wounds. I am actually very pleased with where things stand today. There is no signs of active infection at this time. No fevers, chills, nausea, vomiting, or diarrhea. 06/08/2021 upon evaluation today patient  appears to be doing well with regard to her  wound. On the leg especially this is very close to complete resolution though is getting really dry at this point I think we can use some Xeroform to try to see if that would be of benefit. She is in agreement with that plan. In regard to the foot this is showing signs of improvement although it still taking some time or doing the [facility] Blue rope packing. 06/15/2021 upon evaluation today patient appears to be doing well with regard to her wounds. The leg is healed and the foot is showing signs of improvement. There does not appear to be any evidence of active infection at this time. No fevers, chills, nausea, vomiting, or diarrhea. 06/22/2021 upon evaluation today patient appears to be doing well with regard to her foot ulcer. She has been tolerating the dressing changes without complication. Fortunately there does not appear to be any signs of active infection at this time. No fevers, chills, nausea, vomiting, or diarrhea. Electronic Signature(s) Signed: 06/22/2021 4:34:04 PM By: Worthy Keeler PA-C Entered By: Worthy Keeler on 06/22/2021 16:34:04 [name] ([id number]) -------------------------------------------------------------------------------- Physical Exam Details Patient Name: [name] Date of Service: 06/22/2021 3:30 PM Medical Record Number: [id number] Patient Account Number: [id number] Date of Birth/Sex: 05/27/1956 (65 y.o. F) Treating RN: Dolan Amen Primary Care Provider: Elsie Stain Other Clinician: Referring Provider: Elsie Stain Treating Provider/Extender: Jeri Cos Weeks in Treatment: 4 Constitutional Well-nourished and well-hydrated in no acute distress. Respiratory normal breathing without difficulty. Psychiatric this patient is able to make decisions and demonstrates good insight into disease process. Alert and Oriented x 3. pleasant and cooperative. Notes Upon inspection the biggest issue I see currently is that we can have some time  getting this to completely fill in from a packing perspective. Even the Hydrofera Blue rope I think is going to be extremely tough to get into this hole at this point being that it has closed down so much. This is a good thing but at the same time does pose a little bit of a complication here. I think that we are probably going to switch to endoform to see if that will be beneficial. Electronic Signature(s) Signed: 06/22/2021 4:34:36 PM By: Worthy Keeler PA-C Entered By: Worthy Keeler on 06/22/2021 16:34:36 [name] ([id number]) -------------------------------------------------------------------------------- Physician Orders Details Patient Name: [name] Date of Service: 06/22/2021 3:30 PM Medical Record Number: [id number] Patient Account Number: [id number] Date of Birth/Sex: 12-18-1955 (65 y.o. 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 [place] for wound care. May utilize formulary equivalent dressing for wound treatment orders unless otherwise specified. Home Health Nurse may visit PRN to address patientos wound care needs. o Scheduled days for dressing changes to be completed; exception, patient has scheduled wound care visit that day. o **Please  direct any NON-WOUND related issues/requests for orders to patient's Primary Care Physician. **If current  dressing causes regression in wound condition, may D/C ordered dressing product/s and apply Normal Saline Moist Dressing daily until next [place] or Other MD appointment. **Notify [facility] of regression in wound condition at [phone]. Bathing/ Shower/ Hygiene o May shower; gently cleanse wound with antibacterial soap, rinse and pat dry prior to dressing wounds Wound Treatment Wound #10 - Foot Wound Laterality: Dorsal, Left Cleanser: Normal Saline 3 x Per Week/30 Days Discharge Instructions: Wash your hands with soap and water. Remove old dressing, discard into plastic bag and place into trash. Cleanse the wound with Normal Saline prior to applying a clean dressing using gauze sponges, not tissues or cotton balls. Do not scrub or use excessive force. Pat dry using gauze sponges, not tissue or cotton balls. Primary Dressing: Endoform 2x2 (in/in) 3 x Per Week/30 Days Discharge Instructions: Cut into thin strip, moisten with saline, pack in wound using sterile tipped applicator for easier guiding- depth about 1.1 cm. Extra sent with patient Secondary Dressing: Mepilex Border Flex, 4x4 (in/in) 3 x Per Week/30 Days Discharge Instructions: Apply to wound as directed. Do not cut. Electronic Signature(s) Signed: 06/22/2021 4:40:04 PM By: Dolan Amen RN Signed: 06/22/2021 5:37:36 PM By: Worthy Keeler PA-C Entered By: Dolan Amen on 06/22/2021 16:11:44 [name] ([id number]) -------------------------------------------------------------------------------- Problem List Details Patient Name: [name] Date of Service: 06/22/2021 3:30 PM Medical Record Number: [id number] Patient Account Number: [id number] Date of Birth/Sex: October 31, 1955 (65 y.o. F) Treating RN: Dolan Amen Primary Care Provider: Elsie Stain Other Clinician: Referring  Provider: Elsie Stain Treating Provider/Extender: Skipper Cliche in Treatment: 4 Active Problems ICD-10 Encounter Code Description Active Date MDM Diagnosis E11.621 Type 2 diabetes mellitus with foot ulcer 05/22/2021 No Yes L97.522 Non-pressure chronic ulcer of other part of left foot with fat layer 05/22/2021 No Yes exposed S81.812A Laceration without foreign body, left lower leg, initial encounter 05/22/2021 No Yes N18.4 Chronic kidney disease, stage 4 (severe) 05/22/2021 No Yes I50.42 Chronic combined systolic (congestive) and diastolic (congestive) heart 05/22/2021 No Yes failure I10 Essential (primary) hypertension 05/22/2021 No Yes Inactive Problems Resolved Problems Electronic Signature(s) Signed: 06/22/2021 3:46:45 PM By: Worthy Keeler PA-C Entered By: Worthy Keeler on 06/22/2021 15:46:43 [name] ([id number]) -------------------------------------------------------------------------------- Progress Note Details Patient Name: [name] Date of Service: 06/22/2021 3:30 PM Medical Record Number: [id number] Patient Account Number: [id number] Date of Birth/Sex: October 12, 1955 (65 y.o. F) Treating RN: Dolan Amen Primary Care Provider: Elsie Stain Other Clinician: Referring Provider: Elsie Stain Treating Provider/Extender: Skipper Cliche in Treatment: 4 Subjective Chief Complaint Information obtained from Patient Left LE and Left Foot Ulcer History of Present Illness (HPI) 65 year old patient who is known to have diabetes for several years and generally has it under good control last hemoglobin A1c was 6.7 done 2 weeks ago. She has several other comorbidities including acute CHF, hypoxia, hyperlipidemia, systolic heart failure, myocardial infarction, coronary artery disease, iron deficiency anemia, renal insufficiency, essential hypertension, morbid obesity and obstructive sleep apnea. She also has a history of a implantable cardioverter  defibrillator placed in September 2015. In the past she's had a coronary angioplasty with stent in 2013, laparoscopic cholecystectomy in 2011, bilateral oophorectomy in April 2012 and she's had some lumbar disc surgery in 1996. She says she's had a lot of peripheral neuropathy and due to this she's had a ulcer on her left second toe for a while. She had a dry eschar on her left heel and recently this was removed and  she found that she had some infection and some drainage from there. She has minimal pain around the heel but no pain on any areas of her toes. she has not been on any antibiotics recently and has known vascular problems in the recent past. Readmission: 05/22/2021 upon evaluation today patient appears to be doing somewhat poorly in regard to the wound that is present on her left lateral lower leg as well as the left dorsal foot. The foot is very minimal and seems to be doing decently well the leg is a little bit worse. The foot is been present since about March 1 and the leg since about May 1. Subsequently the patient tells me that this is due to a trauma. She does have diabetes. With that being said she also has chronic kidney disease stage IV, congestive heart failure, and hypertension. She tells me that this is actually gotten significantly better since the initial injury. With that being said there still is a significant issue here that we do need to try to get under control. The patient voiced understanding. Nonetheless I do believe that this is going to require regular wound care here in the clinic with [place]. Patient's wounds are going also require some sharp debridement to clear away some of the necrotic tissue from both locations and I think this is absolutely appropriate based on what we are seeing today I think it would also help with getting things moving in a much better direction in general. 05/31/2021 upon evaluation today patient actually appears to be doing excellent in regard to  her wounds. I am actually very pleased with where things stand today. There is no signs of active infection at this time. No fevers, chills, nausea, vomiting, or diarrhea. 06/08/2021 upon evaluation today patient appears to be doing well with regard to her wound. On the leg especially this is very close to complete resolution though is getting really dry at this point I think we can use some Xeroform to try to see if that would be of benefit. She is in agreement with that plan. In regard to the foot this is showing signs of improvement although it still taking some time or doing the [facility] Blue rope packing. 06/15/2021 upon evaluation today patient appears to be doing well with regard to her wounds. The leg is healed and the foot is showing signs of improvement. There does not appear to be any evidence of active infection at this time. No fevers, chills, nausea, vomiting, or diarrhea. 06/22/2021 upon evaluation today patient appears to be doing well with regard to her foot ulcer. She has been tolerating the dressing changes without complication. Fortunately there does not appear to be any signs of active infection at this time. No fevers, chills, nausea, vomiting, or diarrhea. Objective Constitutional Well-nourished and well-hydrated in no acute distress. Vitals Time Taken: 3:42 PM, Height: 68 in, Weight: 278 lbs, BMI: 42.3, Temperature: 98.6 F, Pulse: 65 bpm, Respiratory Rate: 18 breaths/min, Blood Pressure: 153/70 mmHg. Respiratory normal breathing without difficulty. Psychiatric this patient is able to make decisions and demonstrates good insight into disease process. Alert and Oriented x 3. pleasant and cooperative. RASHEA, HOSKIE ([id number]) General Notes: Upon inspection the biggest issue I see currently is that we can have some time getting this to completely fill in from a packing perspective. Even the Hydrofera Blue rope I think is going to be extremely tough to get into this hole  at this point being that it has closed down so  much. This is a good thing but at the same time does pose a little bit of a complication here. I think that we are probably going to switch to endoform to see if that will be beneficial. Integumentary (Hair, Skin) Wound #10 status is Open. Original cause of wound was Surgical Injury. The date acquired was: 12/05/2020. The wound has been in treatment 4 weeks. The wound is located on the Left,Dorsal Foot. The wound measures 0.1cm length x 0.1cm width x 1.1cm depth; 0.008cm^2 area and 0.009cm^3 volume. There is Fat Layer (Subcutaneous Tissue) exposed. There is no tunneling or undermining noted. There is a medium amount of serosanguineous drainage noted. There is no granulation within the wound bed. There is no necrotic tissue within the wound bed. Assessment Active Problems ICD-10 Type 2 diabetes mellitus with foot ulcer Non-pressure chronic ulcer of other part of left foot with fat layer exposed Laceration without foreign body, left lower leg, initial encounter Chronic kidney disease, stage 4 (severe) Chronic combined systolic (congestive) and diastolic (congestive) heart failure Essential (primary) hypertension Plan Follow-up Appointments: Return Appointment in 1 week. Home Health: [place]: - [place] for wound care. May utilize formulary equivalent dressing for wound treatment orders unless otherwise specified. Home Health Nurse may visit PRN to address patient s wound care needs. Scheduled days for dressing changes to be completed; exception, patient has scheduled wound care visit that day. **Please direct any NON-WOUND related issues/requests for orders to patient's Primary Care Physician. **If current dressing causes regression in wound condition, may D/C ordered dressing product/s and apply Normal Saline Moist Dressing daily until next [place] or Other MD appointment. **Notify [facility]  of regression in wound condition at [phone]. Bathing/ Shower/ Hygiene: May shower; gently cleanse wound with antibacterial soap, rinse and pat dry prior to dressing wounds WOUND #10: - Foot Wound Laterality: Dorsal, Left Cleanser: Normal Saline 3 x Per Week/30 Days Discharge Instructions: Wash your hands with soap and water. Remove old dressing, discard into plastic bag and place into trash. Cleanse the wound with Normal Saline prior to applying a clean dressing using gauze sponges, not tissues or cotton balls. Do not scrub or use excessive force. Pat dry using gauze sponges, not tissue or cotton balls. Primary Dressing: Endoform 2x2 (in/in) 3 x Per Week/30 Days Discharge Instructions: Cut into thin strip, moisten with saline, pack in wound using sterile tipped applicator for easier guiding-depth about 1.1 cm. Extra sent with patient Secondary Dressing: Mepilex Border Flex, 4x4 (in/in) 3 x Per Week/30 Days Discharge Instructions: Apply to wound as directed. Do not cut. 1. Would recommend currently that we switch to endoform for the patient and I described what this is and how it works to her as well today. 2. I am also can recommend that we have the patient continue with dressing changes 3 times a week which I think is appropriate. 3. I am also can recommend that we have the patient continue to cover this with the border foam dressing which I think is doing a great job. We will see patient back for reevaluation in 1 week here in the clinic. If anything worsens or changes patient will contact our office for additional recommendations. Electronic Signature(s) Signed: 06/22/2021 4:35:10 PM By: Worthy Keeler PA-C Entered By: Worthy Keeler on 06/22/2021 16:35:10 [name] ([id number]) -------------------------------------------------------------------------------- SuperBill Details Patient Name: [name] Date of Service: 06/22/2021 Medical Record Number:  [id number] Patient Account Number: [id number] Date of  Birth/Sex: [birth year] (65 y.o. 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 S81.812A Laceration without foreign body, left lower leg, initial encounter N18.4 Chronic kidney disease, stage 4 (severe) I50.42 Chronic combined systolic (congestive) and diastolic (congestive) heart failure I10 Essential (primary) hypertension Facility Procedures CPT4 Code: [id number] Description: 99213 - WOUND CARE VISIT-LEV 3 EST PT Modifier: Quantity: 1 Physician Procedures CPT4 Code: [id number] Description: 99214 - WC PHYS LEVEL 4 - EST PT Modifier: Quantity: 1 CPT4 Code: Description: ICD-10 Diagnosis Description E11.621 Type 2 diabetes mellitus with foot ulcer L97.522 Non-pressure chronic ulcer of other part of left foot with fat layer ex Y81.859M Laceration without foreign body, left lower leg, initial encounter N18.4  Chronic kidney disease, stage 4 (severe) Modifier: posed Quantity: Electronic Signature(s) Signed: 06/22/2021 4:35:25 PM By: Worthy Keeler PA-C Previous Signature: 06/22/2021 4:33:37 PM Version By: Dolan Amen RN Entered By: Worthy Keeler on 06/22/2021 16:35:25

## 2021-06-25 ENCOUNTER — Telehealth: Payer: Self-pay | Admitting: Pharmacist

## 2021-06-25 NOTE — Progress Notes (Signed)
Chronic Care Management Pharmacy Assistant   Name: [name] MRN: [id number] DOB: Oct 04, 1956  Reason for Encounter: Initial Questions Appointment: Televisit 06/29/21 @ 9 am   Recent office visits:  02/15/21 Damita Dunnings (PCP) [facility] f/u. Osteomyelitis. Start Gabapentin 600 mg & Insulin Aspart 3x a day.   Recent consult visits: 06/07/21 Jacqualyn Posey (Podiatry) - Diabetes mellitus due to underlying condition with diabetic autonomic neuropathy, unspecified whether long term insulin use. No med changes.  05/31/21 Stone,PA (Wound Care) - NP Left LE and Left Foot Ulcer. Apply Hydrofera Blue Ready to wound. Weekly treatments.  05/17/21 Wagoner (Podiatry) - Diabetes mellitus due to underlying condition with diabetic autonomic neuropathy, unspecified whether long term insulin use. No med changes.  05/10/21 Wagoner (Podiatry) - Ulcer of left foot with fat layer exposed. No med changes.  05/01/21 Wagoner (Podiatry) - Amputation of left great toe. No med changes.  04/27/21 Wagoner (Podiatry) - Amputation of left great toe. No med changes.  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 No med changes.  04/20/21 Manandhar (Infectious Disease) - Acute osteomyelitis.   04/16/21 Wagoner (Podiatry) - Amputation of left great toe. No med changes.  04/10/21 Wagoner (Podiatry) - Amputation of left great toe. No med changes.  04/06/21 Patel (Podiatry) - Amputation of left great toe. No med changes.  04/02/21 12/25/20 Kolluru (Nephrology) - Chronic kidney disease, stage 4. F/u 3 mos.  03/30/21 Manandhar (Infectious Disease) - Acute osteomyelitis. Start Daptomycin for 6 wks.  03/29/21 Wagoner (Podiatry) - Amputation of left great toe. No med changes.  06/25/21 Caryl Comes ( Cardiology) - Nonischemic cardiomyopathy. EKG 12-lead. No med changes.  6/716/22 Wagoner (Podiatry) - Post op. Amputation of left great toe. No med changes.  03/13/21 Wagoner (Podiatry) - Amputation of left  great toe. No med changes.  02/16/21 Patel (Podiatry) - Hematoma of left lower leg. Start Amoxicillin 875-125 mg.  01/26/21 Patel (Podiatry) - Diabetic peripheral neuropathy associated with type 2 diabetes mellitus. D/c Cephalexin & Clindamycin.  01/22/21 Evans (Podiatry) - f/u foot ulcer. Start GARAMYCIN 0.1%.   01/04/21 Egerton (Podiatry) - Foot Ulcer. No med changes.  12/28/20 Wagoner (Podiatry) - Ulcer of foot, limited to breakdown of skin. No med changes.  12/25/20 Kolluru (Nephrology) - F/u Chronic kidney disease, Stage IV. Start Cephalexin 500mg  & Empagliflozin 10 mg.D/c Cacitriol.  F/u 3 mos.  Hospital visits:  Medication Reconciliation was completed by comparing discharge summary, patient's EMR and Pharmacy list, and upon discussion with patient.  2. Admitted to the hospital on 02/26/21 due to Osteomyelitis. Discharge date was 03/07/21. Discharged from [facility]?Medications Started at [facility] Discharge:?? -START taking: daptomycin IVPB (CUBICIN) metroNIDAZOLE (FLAGYL)  Medication Changes at Hospital Discharge: -CHANGE how you take: atorvastatin (LIPITOR)  Medications Discontinued at Hospital Discharge: -STOP taking: amoxicillin-clavulanate 500-125 MG tablet (AUGMENTIN) amoxicillin-clavulanate 875-125 MG tablet (AUGMENTIN) torsemide 20 MG tablet (DEMADEX)  Medications that remain the same after Hospital Discharge:??  -All other medications will remain the same.    Admitted to the hospital on 01/28/21 due to Cellulitis of left foot. Discharge date was 02/06/21. Discharged from [facility].   START taking: amoxicillin-clavulanate (AUGMENTIN) Aspirin atorvastatin (LIPITOR)   STOP taking: clindamycin 300 MG capsule (CLEOCIN) empagliflozin 10 MG Tabs tablet (Jardiance) gentamicin cream 0.1 % (GARAMYCIN) losartan 25 MG tablet (COZAAR) OneTouch Ultra test strip rosuvastatin 10 MG tablet (CRESTOR) torsemide 20 MG tablet (DEMADEX) Review your updated  medication list below.  04/25/21 Wagoner (Podiatry) - [facility].  START taking: doxycycline (VIBRAMYCIN) HYDROcodone-acetaminophen (NORCO/VICODIN)  STOP taking: traMADol 50 MG tablet  01/13/21 Darl Householder (ED Provider) - [facility]. Cellulitis of foot.   Start Clindamycin 300mg .  ASK how to take: aspirin 81 MG EC tablet clopidogrel 75 MG tablet (PLAVIX) Lantus SoloStar 100 UNIT/ML Solostar Pen (insulin glargine)  Medications: Outpatient Encounter Medications as of 06/25/2021  Medication Sig Note   amiodarone (PACERONE) 200 MG tablet TAKE 1 TABLET BY MOUTH ONCE DAILY    amoxicillin-clavulanate (AUGMENTIN) 875-125 MG tablet Take 1 tablet by mouth 2 (two) times daily.    aspirin EC 81 MG EC tablet Take 1 tablet (81 mg total) by mouth daily. Swallow whole.    atorvastatin (LIPITOR) 10 MG tablet Take 1 tablet (10 mg total) by mouth daily.    calcitRIOL (ROCALTROL) 0.5 MCG capsule Take 0.5 mcg by mouth every morning.    Calcium Carb-Cholecalciferol (CALCIUM CARBONATE-VITAMIN D3) 600-400 MG-UNIT TABS Take 1 tablet by mouth 2 (two) times a day.    carvedilol (COREG) 3.125 MG tablet Take 3.125 mg by mouth every morning.    clopidogrel (PLAVIX) 75 MG tablet TAKE 1 TABLET BY MOUTH ONCE DAILY    DULoxetine (CYMBALTA) 30 MG capsule Take 1 capsule (30 mg total) by mouth at bedtime.    ferrous sulfate 325 (65 FE) MG tablet Take 325 mg by mouth 2 (two) times daily with a meal.    gabapentin (NEURONTIN) 600 MG tablet Take 1 tablet (600 mg total) by mouth at bedtime.    HYDROcodone-acetaminophen (NORCO/VICODIN) 5-325 MG tablet Take 1-2 tablets by mouth every 6 (six) hours as needed.    insulin aspart (NOVOLOG FLEXPEN) 100 UNIT/ML FlexPen Inject 5-10 Units into the skin 3 (three) times daily before meals. Sugar 100-149 - 5 units.  150-199- 6 units.  200-249- 7 units.  250-299- 8 units. 300-349- 9 units.  350+ 10 units. 04/25/2021: 5u at 1630 04/24/21   Insulin Pen Needle (PEN NEEDLES) 32G X 6  MM MISC 1 each by Does not apply route 4 (four) times daily.    isosorbide mononitrate (IMDUR) 30 MG 24 hr tablet Take 1 tablet (30 mg total) by mouth daily. Needs appt    LANTUS SOLOSTAR 100 UNIT/ML Solostar Pen INJECT 22 UNITS UNDER THE SKIN DAILY. (Patient taking differently: Inject 22 Units into the skin at bedtime.) 04/25/2021: 11u 2230 04/24/21   levothyroxine (SYNTHROID) 50 MCG tablet Take 1 tablet (50 mcg total) by mouth daily before breakfast.    losartan (COZAAR) 25 MG tablet TAKE 1/2 TABLET BY MOUTH DAILY    Multiple Vitamin (MULTIVITAMIN WITH MINERALS) TABS tablet Take 1 tablet by mouth every morning.    nitroGLYCERIN (NITROSTAT) 0.4 MG SL tablet Place 1 tablet (0.4 mg total) under the tongue every 5 (five) minutes as needed for chest pain.    Omega-3 Fatty Acids (FISH OIL) 1000 MG CAPS Take 1,000 mg by mouth 2 (two) times a day.    ONETOUCH ULTRA test strip UUD TO CHECK BLOOD SUGAR 3 TIMES DAILY    torsemide (DEMADEX) 20 MG tablet Take 1 tablet (20 mg total) by mouth daily.    traMADol (ULTRAM) 50 MG tablet TAKE 1 TO 2 TABLET BY MOUTH AT BEDTIME AS NEEDED    No facility-administered encounter medications on file as of 06/25/2021.    Have you seen any other providers since your last visit?  Patient states she has seen her wound care provider, her podiatrists and cardiologist this month.   Any changes in  your medications or health?  Patient states no changes in medications.  Any side effects from any medications?  Patient states no side effects.  Do you have an symptoms or problems not managed by your medications?  Patient states no symptoms or problems.  Any concerns about your health right now?  Patient states no concerns at this time.  Has your provider asked that you check blood pressure, blood sugar, or follow special diet at home?  Patient states she checks her BP & glucose and her readings are usually in normal range. Patient states she follows a no salt diet.  Do you  get any type of exercise on a regular basis?  Patient states she does not exercise.  Can you think of a goal you would like to reach for your health?  Patient states she does not have any goals set.   Do you have any problems getting your medications?  Patient states she does not have any problems getting her meds.  Is there anything that you would like to discuss during the appointment?  Patient states no concerns at this time.  Please bring medications and supplements to appointment.   Star Rating Drugs: Atorvastatin - last fill 05/26/21 Wann, [place] Clinical Pharmacists Assistant [phone]

## 2021-06-26 DIAGNOSIS — I70202 Unspecified atherosclerosis of native arteries of extremities, left leg: Secondary | ICD-10-CM | POA: Diagnosis not present

## 2021-06-26 DIAGNOSIS — F32A Depression, unspecified: Secondary | ICD-10-CM | POA: Diagnosis not present

## 2021-06-26 DIAGNOSIS — I447 Left bundle-branch block, unspecified: Secondary | ICD-10-CM | POA: Diagnosis not present

## 2021-06-26 DIAGNOSIS — I255 Ischemic cardiomyopathy: Secondary | ICD-10-CM | POA: Diagnosis not present

## 2021-06-26 DIAGNOSIS — E1122 Type 2 diabetes mellitus with diabetic chronic kidney disease: Secondary | ICD-10-CM | POA: Diagnosis not present

## 2021-06-26 DIAGNOSIS — D631 Anemia in chronic kidney disease: Secondary | ICD-10-CM | POA: Diagnosis not present

## 2021-06-26 DIAGNOSIS — E1151 Type 2 diabetes mellitus with diabetic peripheral angiopathy without gangrene: Secondary | ICD-10-CM | POA: Diagnosis not present

## 2021-06-26 DIAGNOSIS — M869 Osteomyelitis, unspecified: Secondary | ICD-10-CM | POA: Diagnosis not present

## 2021-06-26 DIAGNOSIS — Z4781 Encounter for orthopedic aftercare following surgical amputation: Secondary | ICD-10-CM | POA: Diagnosis not present

## 2021-06-26 DIAGNOSIS — E1169 Type 2 diabetes mellitus with other specified complication: Secondary | ICD-10-CM | POA: Diagnosis not present

## 2021-06-26 DIAGNOSIS — S81802D Unspecified open wound, left lower leg, subsequent encounter: Secondary | ICD-10-CM | POA: Diagnosis not present

## 2021-06-26 DIAGNOSIS — I5023 Acute on chronic systolic (congestive) heart failure: Secondary | ICD-10-CM | POA: Diagnosis not present

## 2021-06-26 DIAGNOSIS — E1142 Type 2 diabetes mellitus with diabetic polyneuropathy: Secondary | ICD-10-CM | POA: Diagnosis not present

## 2021-06-26 DIAGNOSIS — E039 Hypothyroidism, unspecified: Secondary | ICD-10-CM | POA: Diagnosis not present

## 2021-06-26 DIAGNOSIS — I252 Old myocardial infarction: Secondary | ICD-10-CM | POA: Diagnosis not present

## 2021-06-26 DIAGNOSIS — G4733 Obstructive sleep apnea (adult) (pediatric): Secondary | ICD-10-CM | POA: Diagnosis not present

## 2021-06-26 DIAGNOSIS — I428 Other cardiomyopathies: Secondary | ICD-10-CM | POA: Diagnosis not present

## 2021-06-26 DIAGNOSIS — I5030 Unspecified diastolic (congestive) heart failure: Secondary | ICD-10-CM | POA: Diagnosis not present

## 2021-06-26 DIAGNOSIS — N184 Chronic kidney disease, stage 4 (severe): Secondary | ICD-10-CM | POA: Diagnosis not present

## 2021-06-26 DIAGNOSIS — I2510 Atherosclerotic heart disease of native coronary artery without angina pectoris: Secondary | ICD-10-CM | POA: Diagnosis not present

## 2021-06-26 DIAGNOSIS — I4891 Unspecified atrial fibrillation: Secondary | ICD-10-CM | POA: Diagnosis not present

## 2021-06-26 DIAGNOSIS — I051 Rheumatic mitral insufficiency: Secondary | ICD-10-CM | POA: Diagnosis not present

## 2021-06-26 DIAGNOSIS — I130 Hypertensive heart and chronic kidney disease with heart failure and stage 1 through stage 4 chronic kidney disease, or unspecified chronic kidney disease: Secondary | ICD-10-CM | POA: Diagnosis not present

## 2021-06-26 DIAGNOSIS — Z89422 Acquired absence of other left toe(s): Secondary | ICD-10-CM | POA: Diagnosis not present

## 2021-06-26 DIAGNOSIS — E785 Hyperlipidemia, unspecified: Secondary | ICD-10-CM | POA: Diagnosis not present

## 2021-06-28 ENCOUNTER — Ambulatory Visit: Payer: HMO | Admitting: Physician Assistant

## 2021-06-28 DIAGNOSIS — T8189XA Other complications of procedures, not elsewhere classified, initial encounter: Secondary | ICD-10-CM | POA: Diagnosis not present

## 2021-06-28 DIAGNOSIS — S81802D Unspecified open wound, left lower leg, subsequent encounter: Secondary | ICD-10-CM | POA: Diagnosis not present

## 2021-06-29 ENCOUNTER — Ambulatory Visit (INDEPENDENT_AMBULATORY_CARE_PROVIDER_SITE_OTHER): Payer: HMO | Admitting: Pharmacist

## 2021-06-29 ENCOUNTER — Other Ambulatory Visit: Payer: Self-pay

## 2021-06-29 DIAGNOSIS — I2510 Atherosclerotic heart disease of native coronary artery without angina pectoris: Secondary | ICD-10-CM

## 2021-06-29 DIAGNOSIS — E1142 Type 2 diabetes mellitus with diabetic polyneuropathy: Secondary | ICD-10-CM

## 2021-06-29 DIAGNOSIS — E1165 Type 2 diabetes mellitus with hyperglycemia: Secondary | ICD-10-CM

## 2021-06-29 DIAGNOSIS — I5022 Chronic systolic (congestive) heart failure: Secondary | ICD-10-CM

## 2021-06-29 DIAGNOSIS — E7800 Pure hypercholesterolemia, unspecified: Secondary | ICD-10-CM

## 2021-06-29 DIAGNOSIS — I10 Essential (primary) hypertension: Secondary | ICD-10-CM

## 2021-06-29 DIAGNOSIS — Z8673 Personal history of transient ischemic attack (TIA), and cerebral infarction without residual deficits: Secondary | ICD-10-CM

## 2021-06-29 DIAGNOSIS — N184 Chronic kidney disease, stage 4 (severe): Secondary | ICD-10-CM

## 2021-06-29 DIAGNOSIS — E039 Hypothyroidism, unspecified: Secondary | ICD-10-CM

## 2021-06-29 DIAGNOSIS — IMO0002 Reserved for concepts with insufficient information to code with codable children: Secondary | ICD-10-CM

## 2021-06-29 DIAGNOSIS — I471 Supraventricular tachycardia: Secondary | ICD-10-CM

## 2021-06-29 NOTE — Patient Instructions (Signed)
Visit Information  Phone number for Pharmacist: [phone]  Thank you for meeting with me to discuss your medications! I look forward to working with you to achieve your health care goals. Below is a summary of what we talked about during the visit:   Goals Addressed             This Visit's Progress    Manage My Medicine       Timeframe:  Long-Range Goal Priority:  High Start Date:     06/29/21                        Expected End Date:    06/29/22                   Follow Up Date Dec 2022   - call for medicine refill 2 or 3 days before it runs out - call if I am sick and can't take my medicine - keep a list of all the medicines I take; vitamins and herbals too - use a pillbox to sort medicine  -Get Shingrix and flu vaccine    Why is this important?   These steps will help you keep on track with your medicines.   Notes:         Care Plan : [place]  Updates made by [name], RPH since 06/29/2021 12:00 AM     Problem: Hypertension, Hyperlipidemia, Diabetes, Atrial Fibrillation, Heart Failure, Coronary Artery Disease, and Chronic Kidney Disease   Priority: High     Long-Range Goal: Disease management   Start Date: 06/29/2021  Expected End Date: 06/29/2022  This Visit's Progress: On track  Priority: High  Note:   Current Barriers:  Unable to independently monitor therapeutic efficacy  Pharmacist Clinical Goal(s):  Patient will achieve adherence to monitoring guidelines and medication adherence to achieve therapeutic efficacy through collaboration with PharmD and provider.   Interventions: 1:1 collaboration with [name], MD regarding development and update of comprehensive plan of care as evidenced by provider attestation and co-signature Inter-disciplinary care team collaboration (see longitudinal plan of care) Comprehensive medication review performed; medication list updated in electronic medical record  Hyperlipidemia: (LDL  goal < 70) -Controlled - LDL is a goal; pt reports compliance with medications as prescribed; she endorses some bleeding issues - when she nicks herself it takes longer to stop bleeding, she denies blood in urine/stool -Hx CAD - MI 07/2012 w/ stent; Hx stroke -Current treatment: Atorvastatin 10 mg daily Isosorbide MN 30 mg daily Nitroglycerin 0.4 mg SL prn Clopidogrel 75 mg daily HS Aspirin 81 mg daily OTC Omega-3 Fish oil 1000 mg BID -Educated on Cholesterol goals; Benefits of statin for ASCVD risk reduction; -Counseled on benefits of clopidogrel/aspirin for stent patency; discussed nuisance bleeding vs major bleeding; counseled on signs/symptoms of major bleeding and when to seek emergency care -Recommended to continue current medication  Diabetes (A1c goal <7%) -Controlled - A1c is at goal; pt endorses compliance with insulin regimen as prescribed; she reports she typically takes Novolog twice a day since she eats 2 meals per day - usually ~5 units in AM, 6-7 units with PM meal -Pt reports rare hypoglycemia; she uses peanut butter for hypoglycemic episodes -Hx toe amputation, osteomyelitis -Current home glucose readings fasting glucose: 83 post prandial glucose: 179 -Current medications: Lantus 22 units daily HS Novolog 5-10 units TID per sliding scale - usually BID Testing supplies -Medications previously tried: Trulicity, glimepiride, Jardiance,  metformin, Januvia -Educated on A1c and blood sugar goals; Complications of diabetes including kidney damage, retinal damage, and cardiovascular disease; -Pt would benefit from GLP-1 agonist for CV risk reduction, weight loss benefits; pt is not open to med changes today, may consider in future -Recommended to continue current medication  Atrial Tachyardia/NSVT/AFIB (Goal: prevent stroke and major bleeding) -Controlled - pt reports palpitations have improved since being on amiodarone -AFIB dx unclear - Pt does have dx of PAF in the chart,  but per cardiology notes this dx is not clearly delineated; seems to be more NSVT/PVCs -CHADSVASC: 8; per chart review patient has never been on anticoagulation for Afib (was briefly on Eliquis for a DVT in 2016); she is currently on DAPT for hx CAD w/ stent -Current treatment: Rate control: Amiodarone 200 mg daily, carvedilol 3.125 mg AM Anticoagulation: none -Medications previously tried: Eliquis (for DVT, 2016) -Counseled on benefits of rate/rhythm control drugs to prevent tachycardia -Consult with PCP/cardiology to clarify Afib dx/if anticoagulation is warranted -Recommended to continue current medication  Heart Failure (Goal: manage symptoms and prevent exacerbations) -Controlled - pt reports compliance with medications; she denies excess swelling; she follows regularly with cardiology/HF clinic; she reports HF doctor started Jardiance but her nephrologist does not want her on that -Current home BP/HR readings: 139/80, HR high 50s-low 60s -Last ejection fraction: 50-55% (Date: 03/07/20) -HF type: Diastolic -NYHA Class: I-II -Current treatment: Carvedilol 3.125 mg BID Isosorbide MN 30 mg daily Losartan 25 mg - 1/2 tab daily Torsemide 20 mg daily -Medications previously tried: Jardiance 10 mg  -Educated on Benefits of medications for managing symptoms and prolonging life; Importance of blood pressure control -Discussed SGLT2i (Jardiance) - given comorbidities (CKD, HF, DM) data supports use of SGLT2i; pt does have recent amputation of toe and SGLT2i have been linked to higher amputation risk, usually in patients with A1c >8; given recency of amputation it is reasonable to hold off restarting Jardiance, but ultimately benefits of SGLT2 likely outweigh risk in this patient -Recommended to continue current medication  Hypothyroidism (Goal: maintain TSH in goal range) -Controlled - pt takes levothyroxine with other meds in AM and waits 30 min to eat. TSH is at goal -Current treatment   Levothyroxine 50 mcg daily AM  -Recommended to continue current medication  CKD stage 3b / HyperPTH (Goal: prevent progression) -Controlled - most recent GFR is improved (previously pt was in Stage 4 CKD) -Current treatment  Calcitriol 0.5 mg daily AM Ferrous sulfate 325 mg BID Losartan 25 mg - 1/2 tab daily -Counseled on maintaining adequate hydration; importance of BP and DM control to prevent CKD progression; avoidance of NSAIDs and other nephrotoxins -Recommended to continue current medication  Depression/Anxiety (Goal: manage symptoms) -Controlled - pt reports current dose is maintaining stable mood -Current treatment: Duloxetine 30 mg daily -PHQ9: 0 -Connected with PCP for mental health support -Educated on Benefits of medication for symptom control; also benefits for neuropathy/pain -Recommended to continue current medication  Neuropathy (Goal: manage symptoms) -Controlled - pt is taking higher dose of gabapentin than prescribed (pt was previously prescribed 3 tab/day); she reports pain is relatively controlled -Current treatment  Duloxetine 30 mg daily HS Gabapentin 600 mg  - 1 AM, 2 HS Tramadol 50 mg PRN - 2 HS -Recommended to continue current medication  Health Maintenance -Vaccine gaps: Shingrix, covid booster, flu -Pt reports she had Covid booster and Prevnar vaccines 06/21/21. Advised to get Shingrix and Flu vaccines -Current therapy:  Calcium carbonate-Vitamin D3 600-400 BID Multivitamin -Patient  is satisfied with current therapy and denies issues -Recommended to continue current medication  Patient Goals/Self-Care Activities Patient will:  - take medications as prescribed focus on medication adherence by pill box check glucose daily, document, and provide at future appointments check blood pressure daily, document, and provide at future appointments weigh daily, and contact provider if weight gain of 3+ lbs overnight, 5+ lbs in a week -Get Shingrix and flu  vaccine        Ms. Granlund was given information about Chronic Care Management services today including:  CCM service includes personalized support from designated clinical staff supervised by her physician, including individualized plan of care and coordination with other care providers 24/7 contact phone numbers for assistance for urgent and routine care needs. Standard insurance, coinsurance, copays and deductibles apply for chronic care management only during months in which we provide at least 20 minutes of these services. Most insurances cover these services at 100%, however patients may be responsible for any copay, coinsurance and/or deductible if applicable. This service may help you avoid the need for more expensive face-to-face services. Only one practitioner may furnish and bill the service in a calendar month. The patient may stop CCM services at any time (effective at the end of the month) by phone call to the office staff.  Patient agreed to services and verbal consent obtained.   Patient verbalizes understanding of instructions provided today and agrees to view in [place].  Telephone follow up appointment with pharmacy team member scheduled for: 3 months  Charlene Brooke, PharmD, [place], CPP Clinical Pharmacist [facility] [phone]

## 2021-06-29 NOTE — Progress Notes (Signed)
Chronic Care Management Pharmacy Note  06/29/2021 Name:  [name] MRN:  [id number] DOB:  Apr 05, 1956  Summary: -Home BG and BP readings are at goal; pt endorses compliance with meds -Pt has dx of Atrial Fibrillation in her chart; however clinical significance is unclear; she is followed by cardiology and only Atrial tachyardia, NSVT, PVCs are mentioned; she has never been on anticoagulation for Afib although CHADSVASC=8 -HF Clinic would like pt to be on Jardiance 10 mg (benefits in CKD, DM, obesity as well); pt states her nephrologist does not want her on Jardiance however the most recent nephrology note from Dr Abigail Butts says to continue Jardiance. (of note SGLT2i have historically been linked to increased amputation risk, however more recent meta analyses do not show a correlation with amputations)  Recommendations/Changes made from today's visit: -Consult with PCP/cardiologist regarding relevance of Afib diagnosis, if anticoagulation is warranted -Advised Jardiance benefits likely outweigh risk; advised pt to discuss again with Dr Haroldine Laws, Dr Abigail Butts   Subjective: [name] is an 65 y.o. year old female who is a primary patient of Damita Dunnings, Elveria Rising, MD.  The CCM team was consulted for assistance with disease management and care coordination needs.    Engaged with patient by telephone for initial visit in response to provider referral for pharmacy case management and/or care coordination services.   Consent to Services:  The patient was given the following information about Chronic Care Management services today, agreed to services, and gave verbal consent: 1. CCM service includes personalized support from designated clinical staff supervised by the primary care provider, including individualized plan of care and coordination with other care providers 2. 24/7 contact phone numbers for assistance for urgent and routine care needs. 3. Service will only be billed when office  clinical staff spend 20 minutes or more in a month to coordinate care. 4. Only one practitioner may furnish and bill the service in a calendar month. 5.The patient may stop CCM services at any time (effective at the end of the month) by phone call to the office staff. 6. The patient will be responsible for cost sharing (co-pay) of up to 20% of the service fee (after annual deductible is met). Patient agreed to services and consent obtained.  Patient Care Team: [name], MD as PCP - General (Family Medicine) Minna Merritts, MD as PCP - Cardiology (Cardiology) Deboraha Sprang, MD as PCP - Electrophysiology (Cardiology) Bensimhon, Shaune Pascal, MD as PCP - Advanced Heart Failure (Cardiology) Calvert Cantor, MD as Consulting Physician (Ophthalmology) Lavonia Dana, MD as Consulting Physician (Internal Medicine) Elvina Mattes, Adele Schilder as Attending Physician (Podiatry) Tsosie Billing, MD as Consulting Physician (Infectious Diseases) Alda Berthold, DO as Consulting Physician (Neurology) [name], [facility] as Pharmacist (Pharmacist)   Patient has been living with her mother since her toe amputation so she can help with dressing changes.   Recent office visits: 02/15/21 Damita Dunnings (PCP) [facility] f/u. Osteomyelitis. Reduce Gabapentin 600 mg HS & start Novolog sliding scale. Advised to restart torsemide 20 mg daily PRN leg swelling. Recheck BMP/K in 1 week.  Recent consult visits: Following with podiatry (Dr Jacqualyn Posey) and wound care for L great toe amputation 05/31/21 Stone,PA (Wound Care) - NP Left LE and Left Foot Ulcer. Apply Hydrofera Blue Ready to wound. Weekly treatments.  05/21/21 NP Allena Katz (HF clinic): restart torsemide 20 mg daily. Hold off on SGLT2i until renal function results. K 5.2, avoid K-rich foods and repeat BMP 7 days.  04/20/21  Manandhar (Infectious Disease) - Acute osteomyelitis. Central line removal. F/u podiatry. F/u ID PRN.  04/02/21 12/25/20 Kolluru  (Nephrology) - Chronic kidney disease, stage 4. F/u 3 mos.   03/30/21 Manandhar (Infectious Disease) - Acute osteomyelitis. Start Daptomycin for 6 wks.   03/25/21 Caryl Comes (Cardiology) - Nonischemic cardiomyopathy. EKG 12-lead. No med changes.  02/16/21 Patel (Podiatry) - Hematoma of left lower leg. Start Amoxicillin 875-125 mg.   01/26/21 Patel (Podiatry) - Diabetic peripheral neuropathy associated with type 2 diabetes mellitus. D/c Cephalexin & Clindamycin.   01/22/21 Evans (Podiatry) - f/u foot ulcer. Start GARAMYCIN 0.1%.   12/25/20 Kolluru (Nephrology) - F/u Chronic kidney disease, Stage IV. Start Cephalexin 538m & Empagliflozin 10 mg.D/c Cacitriol.  F/u 3 mos.  Hospital visits: Admitted 5/22 for cellulitis. She underwent amputation of left great toe 5/27. Repeat arterial studies 5/27 showed a 50-59% stenosis on the distal left popliteal stent segment, no new interventions per vascular. ID was consulted and recommended 6 weeks of abx. Tunneled central line placed (PICC avoided due to CKD) and she was discharged on Daptomycin  Admitted 4/22 for osteomyelitis of left foot after failing outpatient management. Had initial surgery (I&D, bone biopsy, and sesamoidectomy). Vascular was consulted after it was felt she was high risk for limb loss due to dampened monophasic signals in left foot on ABI. She underwent CO2 angiogram with angioplasty of 3 separate in-stent stenoses in the left superficial femoral artery, and then revision of surgery (bone resection of 5th metatarsal head and base of the prox phalanx of the 5th). Torsemide held at discharge due to worsening renal function.  Objective:  Lab Results  Component Value Date   CREATININE 1.56 (H) 05/21/2021   BUN 30 (H) 05/21/2021   GFR 26.41 (L) 02/15/2021   GFRNONAA 36 (L) 05/21/2021   GFRAA 23 (L) 11/05/2019   NA 141 05/21/2021   K 5.2 (H) 05/21/2021   CALCIUM 9.0 05/21/2021   CO2 28 05/21/2021   GLUCOSE 160 (H) 05/21/2021    Lab  Results  Component Value Date/Time   HGBA1C 6.8 (H) 04/23/2021 12:23 PM   HGBA1C 8.1 (H) 01/28/2021 11:10 PM   FRUCTOSAMINE 305 (H) 12/24/2018 05:53 PM   GFR 26.41 (L) 02/15/2021 03:29 PM   GFR 15.54 (L) 01/24/2021 09:53 AM   MICROALBUR 6.9 08/06/2016 02:32 PM   MICROALBUR 10.0 12/12/2015 03:48 PM    Last diabetic Eye exam: No results found for: HMDIABEYEEXA  Last diabetic Foot exam: No results found for: HMDIABFOOTEX   Lab Results  Component Value Date   CHOL 128 09/26/2020   HDL 44 09/26/2020   LDLCALC 60 09/26/2020   TRIG 134 09/26/2020   CHOLHDL 2.9 09/26/2020    Hepatic Function Latest Ref Rng & Units 03/03/2021 02/26/2021 02/15/2021  Total Protein 6.5 - 8.1 g/dL 6.1(L) 7.2 7.2  Albumin 3.5 - 5.0 g/dL 3.0(L) 3.5 3.7  AST 15 - 41 U/L 16 17 24   ALT 0 - 44 U/L 14 15 22   Alk Phosphatase 38 - 126 U/L 55 64 67  Total Bilirubin 0.3 - 1.2 mg/dL 0.4 0.7 0.4  Bilirubin, Direct 0.00 - 0.40 mg/dL - - -    Lab Results  Component Value Date/Time   TSH 1.160 03/27/2021 12:47 PM   TSH 1.400 09/26/2020 12:16 PM   FREET4 1.19 03/22/2019 11:59 AM   FREET4 1.1 04/16/2016 04:50 PM    CBC Latest Ref Rng & Units 04/25/2021 03/07/2021 03/05/2021  WBC 4.0 - 10.5 K/uL 7.5 7.7 6.8  Hemoglobin 12.0 -  15.0 g/dL 12.1 10.8(L) 9.2(L)  Hematocrit 36.0 - 46.0 % 38.1 32.6(L) 28.2(L)  Platelets 150 - 400 K/uL 185 180 141(L)    Lab Results  Component Value Date/Time   VD25OH 32 06/01/2012 10:00 AM   CHA2DS2-VASc Score = 8  The patient's score is based upon: CHF History: 1 HTN History: 1 Diabetes History: 1 Stroke History: 2 Vascular Disease History: 1 Age Score: 1 Gender Score: 1       Clinical ASCVD: Yes  The ASCVD Risk score (Arnett DK, et al., 2019) failed to calculate for the following reasons:   The patient has a prior MI or stroke diagnosis    Depression screen [facility] 2/9 03/30/2021 03/21/2021 09/28/2020  Decreased Interest 0 0 0  Down, Depressed, Hopeless - 0 1  PHQ - 2 Score 0 0 1   Altered sleeping - 0 0  Tired, decreased energy - 0 3  Change in appetite - 0 3  Feeling bad or failure about yourself  - 0 0  Trouble concentrating - 0 0  Moving slowly or fidgety/restless - 0 0  Suicidal thoughts - 0 0  PHQ-9 Score - 0 7  Difficult doing work/chores - Not difficult at all Not difficult at all  Some recent data might be hidden    Social History   Tobacco Use  Smoking Status Never  Smokeless Tobacco Never   BP Readings from Last 3 Encounters:  05/21/21 139/77  04/27/21 (!) 154/79  04/25/21 (!) 142/66   Pulse Readings from Last 3 Encounters:  05/21/21 67  04/27/21 65  04/25/21 (!) 58   Wt Readings from Last 3 Encounters:  05/21/21 277 lb 9.6 oz (125.9 kg)  04/23/21 270 lb 6.4 oz (122.7 kg)  04/20/21 275 lb (124.7 kg)   BMI Readings from Last 3 Encounters:  05/21/21 42.21 kg/m  04/23/21 41.11 kg/m  04/20/21 41.81 kg/m    Assessment/Interventions: Review of patient past medical history, allergies, medications, health status, including review of consultants reports, laboratory and other test data, was performed as part of comprehensive evaluation and provision of chronic care management services.   SDOH:  (Social Determinants of Health) assessments and interventions performed: Yes  SDOH Screenings   Alcohol Screen: Low Risk    Last Alcohol Screening Score (AUDIT): 0  Depression (PHQ2-9): Low Risk    PHQ-2 Score: 0  Financial Resource Strain: Low Risk    Difficulty of Paying Living Expenses: Not hard at all  Food Insecurity: No Food Insecurity   Worried About Charity fundraiser in the Last Year: Never true   Ran Out of Food in the Last Year: Never true  Housing: Low Risk    Last Housing Risk Score: 0  Physical Activity: Inactive   Days of Exercise per Week: 0 days   Minutes of Exercise per Session: 0 min  Social Connections: Not on file  Stress: No Stress Concern Present   Feeling of Stress : Not at all  Tobacco Use: Low Risk    Smoking  Tobacco Use: Never   Smokeless Tobacco Use: Never  Transportation Needs: No Transportation Needs   Lack of Transportation (Medical): No   Lack of Transportation (Non-Medical): No    CCM Care Plan  Allergies  Allergen Reactions   Heparin     Can not remember reaction   Nsaids     Kidney disease    Medications Reviewed Today     Reviewed by [name], [facility] (Pharmacist) on 06/29/21 at 1517  Med List Status: <None>   Medication Order Taking? Sig Documenting Provider Last Dose Status Informant  amiodarone (PACERONE) 200 MG tablet [id number] Yes TAKE 1 TABLET BY MOUTH ONCE DAILY Bensimhon, Shaune Pascal, MD Taking Active   aspirin EC 81 MG EC tablet [id number] Yes Take 1 tablet (81 mg total) by mouth daily. Swallow whole. Nita Sells, MD Taking Active   atorvastatin (LIPITOR) 10 MG tablet [id number] Yes Take 1 tablet (10 mg total) by mouth daily. Geradine Girt, DO Taking Active Self  calcitRIOL (ROCALTROL) 0.5 MCG capsule [id number] Yes Take 0.5 mcg by mouth every morning. [provider] Taking Active Self  Calcium Carb-Cholecalciferol (CALCIUM CARBONATE-VITAMIN D3) 600-400 MG-UNIT TABS [id number] Yes Take 1 tablet by mouth 2 (two) times a day. [provider] Taking Active Self  carvedilol (COREG) 3.125 MG tablet [id number] Yes Take 3.125 mg by mouth every morning. [provider] Taking Active Self  clopidogrel (PLAVIX) 75 MG tablet [id number] Yes TAKE 1 TABLET BY MOUTH ONCE DAILY Gollan, Kathlene November, MD Taking Active   DULoxetine (CYMBALTA) 30 MG capsule [id number] Yes Take 1 capsule (30 mg total) by mouth at bedtime. Alda Berthold, DO Taking Active Self  ferrous sulfate 325 (65 FE) MG tablet [id number] Yes Take 325 mg by mouth 2 (two) times daily with a meal. [provider] Taking Active Self  gabapentin (NEURONTIN) 600 MG tablet [id number] Yes Take 1 tablet (600 mg total) by mouth at bedtime.  Patient taking differently: Take 600 mg by mouth.  1 tab AM and 2 tabs HS   [name], MD Taking Active Self  insulin aspart (NOVOLOG FLEXPEN) 100 UNIT/ML FlexPen [id number] Yes Inject 5-10 Units into the skin 3 (three) times daily before meals. Sugar 100-149 - 5 units.  150-199- 6 units.  200-249- 7 units.  250-299- 8 units. 300-349- 9 units.  350+ 10 units. [name], MD Taking Active Self           Med Note [name]   Fri Jun 29, 2021 10:14 AM)    Insulin Pen Needle (PEN NEEDLES) 32G X 6 MM MISC [id number] Yes 1 each by Does not apply route 4 (four) times daily. Jacelyn Pi, Lilia Argue, MD Taking Active Self  isosorbide mononitrate (IMDUR) 30 MG 24 hr tablet [id number] Yes Take 1 tablet (30 mg total) by mouth daily. Needs appt Bensimhon, Shaune Pascal, MD Taking Active Self  LANTUS SOLOSTAR 100 UNIT/ML Solostar Pen [id number] Yes INJECT 22 UNITS UNDER THE SKIN DAILY.  Patient taking differently: Inject 22 Units into the skin at bedtime.   [name], MD Taking Active            Med Note Lenord Fellers, Cleaster Corin   Fri Jun 29, 2021 10:14 AM)    levothyroxine (SYNTHROID) 50 MCG tablet [id number] Yes Take 1 tablet (50 mcg total) by mouth daily before breakfast. [name], MD Taking Active   losartan (COZAAR) 25 MG tablet [id number] Yes TAKE 1/2 TABLET BY MOUTH DAILY Bensimhon, Shaune Pascal, MD Taking Active   Multiple Vitamin (MULTIVITAMIN WITH MINERALS) TABS tablet [id number] Yes Take 1 tablet by mouth every morning. [provider] Taking Active Self  nitroGLYCERIN (NITROSTAT) 0.4 MG SL tablet [id number] Yes Place 1 tablet (0.4 mg total) under the tongue every 5 (five) minutes as needed for chest pain. Deboraha Sprang, MD Taking Active Self  Omega-3 Fatty Acids (FISH OIL) 1000 MG CAPS [id number] Yes Take 1,000 mg by mouth 2 (two) times  a day. [provider] Taking Active Self  Donald Siva test strip [id number] Yes UUD TO CHECK BLOOD SUGAR 3 TIMES DAILY [name], MD Taking Active Self  torsemide  (DEMADEX) 20 MG tablet [id number] Yes Take 1 tablet (20 mg total) by mouth daily. [place], Maricela Bo, FNP Taking Active   traMADol (ULTRAM) 50 MG tablet [id number] Yes TAKE 1 TO 2 TABLET BY MOUTH AT BEDTIME AS NEEDED [name], MD Taking Active             Patient Active Problem List   Diagnosis Date Noted   Medication monitoring encounter 03/31/2021   Cellulitis of left foot 02/27/2021   Cellulitis 02/27/2021   Osteomyelitis ([place]) 02/18/2021   CKD (chronic kidney disease) stage 4, GFR 15-29 ml/min ([place]) 01/28/2021   AICD (automatic cardioverter/defibrillator) present 01/28/2021   Diabetic foot ulcer ([place]) 11/24/2020   Advance care planning 10/04/2020   Secondary hyperparathyroidism of renal origin ([place]) 07/20/2019   Hyperkalemia 07/20/2019   Benign hypertensive kidney disease with chronic kidney disease 07/20/2019   Anemia in chronic kidney disease 07/20/2019   Positive antinuclear antibody 07/20/2019   Proteinuria 07/20/2019   Acute on chronic systolic (congestive) heart failure ([place]) 03/23/2019   UTI (urinary tract infection) 03/09/2019   CAD (coronary artery disease) 03/09/2019   DM type 2, uncontrolled, with renal complications ([place]) 97/35/3299   Atrial tachycardia ([place])    Acute kidney injury superimposed on chronic kidney disease ([place])    AKI (acute kidney injury) ([place])    Weakness generalized 12/22/2018   Shortness of breath 12/22/2018   Palliative care encounter 12/22/2018   Endocarditis 12/14/2018   NSTEMI (non-ST elevated myocardial infarction) ([place]) 11/22/2018   Acute on chronic combined systolic and diastolic CHF (congestive heart failure) ([place]) 10/28/2018   Lymphedema 10/28/2018   Severe sepsis ([place]) 09/28/2018   History of amputation of lesser toe of right foot ([place]) 04/19/2018   Presence of cardiac resynchronization therapy defibrillator (CRT-D) 04/19/2018   Paraparesis of both lower limbs ([place]) 03/29/2018   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    Paroxysmal atrial fibrillation ([place]) 03/29/2018   Class 3 obesity due to excess calories with serious comorbidity and body mass index (BMI) of 40.0 to 44.9 in adult 09/02/2016   History of CVA (cerebrovascular accident) 11/11/2015   Chronic renal insufficiency 08/04/2015   OSA (obstructive sleep apnea) 08/15/2014   Morbid obesity ([place]) 10/26/2013   Essential hypertension, benign 04/29/2013   Pure hypercholesterolemia 12/07/2012   Iron deficiency anemia 12/07/2012   Diabetic peripheral neuropathy associated with type 2 diabetes mellitus ([place]) 24/26/8341   Chronic systolic congestive heart failure ([place]) 07/29/2012   Left bundle-branch block 07/25/2012   Coronary artery disease involving native coronary artery of native heart without angina pectoris 07/25/2012   Heart failure ([place]) 07/21/2012   Hyperlipidemia 07/21/2012    Immunization History  Administered Date(s) Administered   Hepatitis B 03/16/2013   Hepatitis B, adult 03/30/2014, 07/18/2014   Influenza Split 07/21/2012, 07/03/2020   Influenza, High Dose Seasonal PF 07/01/2019   Influenza,inj,Quad PF,6+ Mos 06/16/2013, 07/07/2014, 06/18/2015, 08/06/2016, 07/20/2018   Influenza-Unspecified 07/23/2017, 07/22/2018, 06/11/2019   PFIZER Comirnaty(Gray Top)Covid-19 Tri-Sucrose Vaccine 06/21/2021   PFIZER(Purple Top)SARS-COV-2 Vaccination 11/21/2019, 12/21/2019, 07/19/2020   PNEUMOCOCCAL CONJUGATE-20 06/21/2021   Pneumococcal Polysaccharide-23 08/08/2011, 06/18/2015, 08/13/2016   Tdap 03/07/2010, 05/03/2016   Zoster, Live 04/06/2014    Conditions to be addressed/monitored:  Hypertension, Hyperlipidemia, Diabetes, Atrial Fibrillation, Heart Failure, Coronary Artery Disease, and Chronic Kidney Disease  Care Plan : [place]  Plan  Updates made by [name], RPH since 06/29/2021 12:00 AM     Problem: Hypertension, Hyperlipidemia, Diabetes, Atrial Fibrillation, Heart Failure, Coronary Artery Disease, and Chronic Kidney  Disease   Priority: High     Long-Range Goal: Disease management   Start Date: 06/29/2021  Expected End Date: 06/29/2022  This Visit's Progress: On track  Priority: High  Note:   Current Barriers:  Unable to independently monitor therapeutic efficacy  Pharmacist Clinical Goal(s):  Patient will achieve adherence to monitoring guidelines and medication adherence to achieve therapeutic efficacy through collaboration with PharmD and provider.   Interventions: 1:1 collaboration with [name], MD regarding development and update of comprehensive plan of care as evidenced by provider attestation and co-signature Inter-disciplinary care team collaboration (see longitudinal plan of care) Comprehensive medication review performed; medication list updated in electronic medical record  Hyperlipidemia: (LDL goal < 70) -Controlled - LDL is a goal; pt reports compliance with medications as prescribed; she endorses some bleeding issues - when she nicks herself it takes longer to stop bleeding, she denies blood in urine/stool -Hx CAD - MI 07/2012 w/ stent; Hx stroke -Current treatment: Atorvastatin 10 mg daily Isosorbide MN 30 mg daily Nitroglycerin 0.4 mg SL prn Clopidogrel 75 mg daily HS Aspirin 81 mg daily OTC Omega-3 Fish oil 1000 mg BID -Educated on Cholesterol goals; Benefits of statin for ASCVD risk reduction; -Counseled on benefits of clopidogrel/aspirin for stent patency; discussed nuisance bleeding vs major bleeding; counseled on signs/symptoms of major bleeding and when to seek emergency care -Recommended to continue current medication  Diabetes (A1c goal <7%) -Controlled - A1c is at goal; pt endorses compliance with insulin regimen as prescribed; she reports she typically takes Novolog twice a day since she eats 2 meals per day - usually ~5 units in AM, 6-7 units with PM meal -Pt reports rare hypoglycemia; she uses peanut butter for hypoglycemic episodes -Hx toe amputation,  osteomyelitis -Current home glucose readings fasting glucose: 83 post prandial glucose: 179 -Current medications: Lantus 22 units daily HS Novolog 5-10 units TID per sliding scale - usually BID Testing supplies -Medications previously tried: Trulicity, glimepiride, Jardiance, metformin, Januvia -Educated on A1c and blood sugar goals; Complications of diabetes including kidney damage, retinal damage, and cardiovascular disease; -Pt would benefit from GLP-1 agonist for CV risk reduction, weight loss benefits; pt is not open to med changes today, may consider in future -Recommended to continue current medication  Atrial Tachyardia/NSVT/AFIB (Goal: prevent stroke and major bleeding) -Controlled - pt reports palpitations have improved since being on amiodarone -AFIB dx unclear - Pt does have dx of PAF in the chart, but per cardiology notes this dx is not clearly delineated; seems to be more NSVT/PVCs -CHADSVASC: 8; per chart review patient has never been on anticoagulation for Afib (was briefly on Eliquis for a DVT in 2016); she is currently on DAPT for hx CAD w/ stent -Current treatment: Rate control: Amiodarone 200 mg daily, carvedilol 3.125 mg AM Anticoagulation: none -Medications previously tried: Eliquis (for DVT, 2016) -Counseled on benefits of rate/rhythm control drugs to prevent tachycardia -Consult with PCP/cardiology to clarify Afib dx/if anticoagulation is warranted -Recommended to continue current medication  Heart Failure (Goal: manage symptoms and prevent exacerbations) -Controlled - pt reports compliance with medications; she denies excess swelling; she follows regularly with cardiology/HF clinic; she reports HF doctor started Jardiance but her nephrologist does not want her on that -Current home BP/HR readings: 139/80, HR high 50s-low 60s -Last ejection fraction: 50-55% (Date:  03/07/20) -HF type: Diastolic -NYHA Class: I-II -Current treatment: Carvedilol 3.125 mg  BID Isosorbide MN 30 mg daily Losartan 25 mg - 1/2 tab daily Torsemide 20 mg daily -Medications previously tried: Jardiance 10 mg  -Educated on Benefits of medications for managing symptoms and prolonging life; Importance of blood pressure control -Discussed SGLT2i (Jardiance) - given comorbidities (CKD, HF, DM) data supports use of SGLT2i; pt does have recent amputation of toe and SGLT2i have been linked to higher amputation risk, usually in patients with A1c >8; given recency of amputation it is reasonable to hold off restarting Jardiance, but ultimately benefits of SGLT2 likely outweigh risk in this patient -Recommended to continue current medication  Hypothyroidism (Goal: maintain TSH in goal range) -Controlled - pt takes levothyroxine with other meds in AM and waits 30 min to eat. TSH is at goal -Current treatment  Levothyroxine 50 mcg daily AM  -Recommended to continue current medication  CKD stage 3b / HyperPTH (Goal: prevent progression) -Controlled - most recent GFR is improved (previously pt was in Stage 4 CKD) -Current treatment  Calcitriol 0.5 mg daily AM Ferrous sulfate 325 mg BID Losartan 25 mg - 1/2 tab daily -Counseled on maintaining adequate hydration; importance of BP and DM control to prevent CKD progression; avoidance of NSAIDs and other nephrotoxins -Recommended to continue current medication  Depression/Anxiety (Goal: manage symptoms) -Controlled - pt reports current dose is maintaining stable mood -Current treatment: Duloxetine 30 mg daily -PHQ9: 0 -Connected with PCP for mental health support -Educated on Benefits of medication for symptom control; also benefits for neuropathy/pain -Recommended to continue current medication  Neuropathy (Goal: manage symptoms) -Controlled - pt is taking higher dose of gabapentin than prescribed (pt was previously prescribed 3 tab/day); she reports pain is relatively controlled -Current treatment  Duloxetine 30 mg daily  HS Gabapentin 600 mg  - 1 AM, 2 HS Tramadol 50 mg PRN - 2 HS -Recommended to continue current medication  Health Maintenance -Vaccine gaps: Shingrix, covid booster, flu -Pt reports she had Covid booster and Prevnar vaccines 06/21/21. Advised to get Shingrix and Flu vaccines -Current therapy:  Calcium carbonate-Vitamin D3 600-400 BID Multivitamin -Patient is satisfied with current therapy and denies issues -Recommended to continue current medication  Patient Goals/Self-Care Activities Patient will:  - take medications as prescribed focus on medication adherence by pill box check glucose daily, document, and provide at future appointments check blood pressure daily, document, and provide at future appointments weigh daily, and contact provider if weight gain of 3+ lbs overnight, 5+ lbs in a week -Get Shingrix and flu vaccine         Medication Assistance: None required.  Patient affirms current coverage meets needs.  Compliance/Adherence/Medication fill history: Care Gaps: DEXA Eye exam (04/09/19) Foot exam (07/21/19)  Star-Rating Drugs: Atorvastatin - LF 06/25/21 x 30 ds Losartan - LF 06/27/21 x 30 ds  Patient's preferred pharmacy is:  [facility] ([place]) [place], [place] [address] Phone: [phone] Fax: [phone]  [place], [place] [place] [address] Phone: [phone] Fax: [phone]  [facility] [address] [place] [address] Phone: [phone] Fax: [phone]  Uses pill box? Yes Pt endorses 100% compliance  We discussed: Benefits of medication synchronization, packaging and delivery as well as enhanced pharmacist oversight with [employer]. Patient decided to: Continue current medication management strategy  Care Plan and Follow Up Patient  Decision:  Patient agrees to Care  Plan and Follow-up.  Plan: Telephone follow up appointment with care management team member scheduled for:  3 months  Charlene Brooke, PharmD, [place], CPP Clinical Pharmacist [facility] [phone]

## 2021-07-04 ENCOUNTER — Other Ambulatory Visit (HOSPITAL_COMMUNITY): Payer: Self-pay | Admitting: Internal Medicine

## 2021-07-04 ENCOUNTER — Other Ambulatory Visit: Payer: Self-pay | Admitting: Family Medicine

## 2021-07-05 ENCOUNTER — Other Ambulatory Visit: Payer: Self-pay

## 2021-07-05 ENCOUNTER — Encounter: Payer: HMO | Admitting: Physician Assistant

## 2021-07-05 DIAGNOSIS — T8189XA Other complications of procedures, not elsewhere classified, initial encounter: Secondary | ICD-10-CM | POA: Diagnosis not present

## 2021-07-05 DIAGNOSIS — E11621 Type 2 diabetes mellitus with foot ulcer: Secondary | ICD-10-CM | POA: Diagnosis not present

## 2021-07-05 NOTE — Progress Notes (Addendum)
[name], [name] ([id number]) Visit Report for 07/05/2021 Chief Complaint Document Details Patient Name: [name], [name] Date of Service: 07/05/2021 3:30 PM Medical Record Number: [id number] Patient Account Number: [id number] Date of Birth/Sex: 11-24-1955 (65 y.o. F) Treating RN: Dolan Amen Primary Care Provider: Elsie Stain Other Clinician: Referring Provider: Elsie Stain Treating Provider/Extender: Skipper Cliche in Treatment: 6 Information Obtained from: Patient Chief Complaint Left LE and Left Foot Ulcer Electronic Signature(s) Signed: 07/05/2021 3:54:57 PM By: Worthy Keeler PA-C Entered By: Worthy Keeler on 07/05/2021 15:54:56 [name] ([id number]) -------------------------------------------------------------------------------- HPI Details Patient Name: [name] Date of Service: 07/05/2021 3:30 PM Medical Record Number: [id number] Patient Account Number: [id number] Date of Birth/Sex: 11/16/1955 (65 y.o. F) Treating RN: Dolan Amen Primary Care Provider: Elsie Stain Other Clinician: Referring Provider: Elsie Stain Treating Provider/Extender: Skipper Cliche in Treatment: 6 History of Present Illness HPI Description: 65 year old patient who is known to have diabetes for several years and generally has it under good control last hemoglobin A1c was 6.7 done 2 weeks ago. She has several other comorbidities including acute CHF, hypoxia, hyperlipidemia, systolic heart failure, myocardial infarction, coronary artery disease, iron deficiency anemia, renal insufficiency, essential hypertension, morbid obesity and obstructive sleep apnea. 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 In the past she's had a coronary angioplasty with stent in 2013, laparoscopic cholecystectomy in 2011, bilateral oophorectomy in April 2012 and she's had some lumbar disc surgery in 1996. She says she's had a lot of  peripheral neuropathy and due to this she's had a ulcer on her left second toe for a while. She had a dry eschar on her left heel and recently this was removed and she found that she had some infection and some drainage from there. She has minimal pain around the heel but no pain on any areas of her toes. she has not been on any antibiotics recently and has known vascular problems in the recent past. Readmission: 05/22/2021 upon evaluation today patient appears to be doing somewhat poorly in regard to the wound that is present on her left lateral lower leg as well as the left dorsal foot. The foot is very minimal and seems to be doing decently well the leg is a little bit worse. The foot is been present since about March 1 and the leg since about May 1. Subsequently the patient tells me that this is due to a trauma. She does have diabetes. With that being said she also has chronic kidney disease stage IV, congestive heart failure, and hypertension. She tells me that this is actually gotten significantly better since the initial injury. With that being said there still is a significant issue here that we do need to try to get under control. The patient voiced understanding. Nonetheless I do believe that this is going to require regular wound care here in the clinic with [place]. Patient's wounds are going also require some sharp debridement to clear away some of the necrotic tissue from both locations and I think this is absolutely appropriate based on what we are seeing today I think it would also help with getting things moving in a much better direction in general. 05/31/2021 upon evaluation today patient actually appears to be doing excellent in regard to her wounds. I am actually very pleased with where things stand today. There is no signs of active infection at this time. No fevers, chills, nausea, vomiting, or diarrhea. 06/08/2021 upon evaluation today patient  appears to be doing well with regard to her  wound. On the leg especially this is very close to complete resolution though is getting really dry at this point I think we can use some Xeroform to try to see if that would be of benefit. She is in agreement with that plan. In regard to the foot this is showing signs of improvement although it still taking some time or doing the [facility] Blue rope packing. 06/15/2021 upon evaluation today patient appears to be doing well with regard to her wounds. The leg is healed and the foot is showing signs of improvement. There does not appear to be any evidence of active infection at this time. No fevers, chills, nausea, vomiting, or diarrhea. 06/22/2021 upon evaluation today patient appears to be doing well with regard to her foot ulcer. She has been tolerating the dressing changes without complication. Fortunately there does not appear to be any signs of active infection at this time. No fevers, chills, nausea, vomiting, or diarrhea. 07/05/2021 upon evaluation today patient appears to be doing awesome in regard to her foot ulcer. This is pretty much about completely closed there is may be a small 0.1 opening but nothing significant whatsoever at this time. I am extremely happy with where we stand. Electronic Signature(s) Signed: 07/05/2021 5:04:15 PM By: Worthy Keeler PA-C Entered By: Worthy Keeler on 07/05/2021 17:04:15 [name] ([id number]) -------------------------------------------------------------------------------- Physical Exam Details Patient Name: [name] Date of Service: 07/05/2021 3:30 PM Medical Record Number: [id number] Patient Account Number: [id number] Date of Birth/Sex: Jul 06, 1956 (65 y.o. F) Treating RN: Dolan Amen Primary Care Provider: Elsie Stain Other Clinician: Referring Provider: Elsie Stain Treating Provider/Extender: Jeri Cos Weeks in Treatment: 6 Constitutional Obese and well-hydrated in no acute distress. Respiratory normal breathing  without difficulty. Psychiatric this patient is able to make decisions and demonstrates good insight into disease process. Alert and Oriented x 3. pleasant and cooperative. Notes Upon inspection patient's wound bed showed signs of good granulation and epithelization there was just a very small area of opening centrally noted at this point. There really does not appear to be any significant slough buildup and really no significant drainage. It was very close to complete resolution today but I do not think were quite there yet. Hopefully we will be by next week. Electronic Signature(s) Signed: 07/05/2021 5:04:37 PM By: Worthy Keeler PA-C Entered By: Worthy Keeler on 07/05/2021 17:04:37 [name] ([id number]) -------------------------------------------------------------------------------- Physician Orders Details Patient Name: [name] Date of Service: 07/05/2021 3:30 PM Medical Record Number: [id number] Patient Account Number: [id number] Date of Birth/Sex: June 12, 1956 (65 y.o. 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 [place] for wound care. May utilize formulary equivalent dressing for wound treatment orders unless otherwise specified. Home Health Nurse may  visit PRN to address patientos wound care needs. o Scheduled days for dressing changes to be completed; exception, patient has scheduled  wound care visit that day. o **Please direct any NON-WOUND related issues/requests for orders to patient's Primary Care Physician. **If current dressing causes regression in wound condition, may D/C ordered dressing product/s and apply Normal Saline Moist Dressing daily until next [place] or Other MD appointment. **Notify Wound Healing Center of regression in wound condition at [phone]. Bathing/ Shower/ Hygiene o May shower; gently cleanse wound with antibacterial soap, rinse and pat dry prior to dressing wounds Wound Treatment Wound #10 - Foot Wound Laterality: Dorsal, Left Cleanser: Normal Saline 3 x Per Week/30 Days Discharge Instructions: Wash your hands with soap and water. Remove old dressing, discard into plastic bag and place into trash. Cleanse the wound with Normal Saline prior to applying a clean dressing using gauze sponges, not tissues or cotton balls. Do not scrub or use excessive force. Pat dry using gauze sponges, not tissue or cotton balls. Primary Dressing: Algicell Calcium Alginate Dressing 2x2 (in/in) 3 x Per Week/30 Days Discharge Instructions: Apply to wound Secondary Dressing: Mepilex Border Flex, 4x4 (in/in) 3 x Per Week/30 Days Discharge Instructions: Apply to wound as directed. Do not cut. Electronic Signature(s) Signed: 07/05/2021 4:16:02 PM By: Dolan Amen RN Signed: 07/05/2021 5:23:54 PM By: Worthy Keeler PA-C Entered By: Dolan Amen on 07/05/2021 15:57:18 [name] ([id number]) -------------------------------------------------------------------------------- Problem List Details Patient Name: [name] Date of Service: 07/05/2021 3:30 PM Medical Record Number: [id number] Patient Account Number: [id number] Date of Birth/Sex: 06-Mar-1956 (65 y.o. F) Treating RN: Dolan Amen Primary Care Provider: Elsie Stain Other Clinician: Referring Provider: Elsie Stain Treating Provider/Extender: Skipper Cliche in Treatment: 6 Active Problems ICD-10 Encounter Code Description Active Date MDM Diagnosis E11.621 Type 2 diabetes mellitus with foot ulcer 05/22/2021 No Yes L97.522 Non-pressure chronic ulcer of other part of left foot with fat layer 05/22/2021 No Yes exposed S81.812A Laceration without foreign body, left lower leg, initial encounter 05/22/2021 No Yes N18.4 Chronic kidney disease, stage 4 (severe) 05/22/2021 No Yes I50.42 Chronic combined systolic (congestive) and diastolic (congestive) heart 05/22/2021 No Yes failure I10 Essential (primary) hypertension 05/22/2021 No Yes Inactive Problems Resolved Problems Electronic Signature(s) Signed: 07/05/2021 3:47:23 PM By: Worthy Keeler PA-C Entered By: Worthy Keeler on 07/05/2021 15:47:23 [name] ([id number]) -------------------------------------------------------------------------------- Progress Note Details Patient Name: [name] Date of Service: 07/05/2021 3:30 PM Medical Record Number: [id number] Patient Account Number: [id number] Date of Birth/Sex: Aug 25, 1956 (65 y.o. F) Treating RN: Dolan Amen Primary Care Provider: Elsie Stain Other Clinician: Referring Provider: Elsie Stain Treating Provider/Extender: Skipper Cliche in Treatment: 6 Subjective Chief Complaint Information obtained from Patient Left LE and Left Foot Ulcer History of Present Illness (HPI) 65 year old patient who is known to have diabetes for several years and generally has it under good control last hemoglobin A1c was 6.7 done 2 weeks ago. She has several other comorbidities including acute CHF, hypoxia, hyperlipidemia, systolic heart failure, myocardial infarction, coronary artery disease, iron deficiency anemia, renal insufficiency, essential hypertension, morbid obesity and obstructive  sleep apnea. 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 In the past she's had a coronary angioplasty with stent in 2013, laparoscopic cholecystectomy in 2011, bilateral oophorectomy in April 2012 and she's had some lumbar disc surgery in 1996. She says she's had a lot of peripheral neuropathy and due to this she's had a ulcer on her left second toe for a while. She had a dry eschar on her left heel and recently this was  removed and she found that she had some infection and some drainage from there. She has minimal pain around the heel but no pain on any areas of her toes. she has not been on any antibiotics recently and has known vascular problems in the recent past. Readmission: 05/22/2021 upon evaluation today patient appears to be doing somewhat poorly in regard to the wound that is present on her left lateral lower leg as well as the left dorsal foot. The foot is very minimal and seems to be doing decently well the leg is a little bit worse. The foot is been present since about March 1 and the leg since about May 1. Subsequently the patient tells me that this is due to a trauma. She does have diabetes. With that being said she also has chronic kidney disease stage IV, congestive heart failure, and hypertension. She tells me that this is actually gotten significantly better since the initial injury. With that being said there still is a significant issue here that we do need to try to get under control. The patient voiced understanding. Nonetheless I do believe that this is going to require regular wound care here in the clinic with [place]. Patient's wounds are going also require some sharp debridement to clear away some of the necrotic tissue from both locations and I think this is absolutely appropriate based on what we are seeing today I think it would also help with getting things moving in a much better direction in general. 05/31/2021 upon  evaluation today patient actually appears to be doing excellent in regard to her wounds. I am actually very pleased with where things stand today. There is no signs of active infection at this time. No fevers, chills, nausea, vomiting, or diarrhea. 06/08/2021 upon evaluation today patient appears to be doing well with regard to her wound. On the leg especially this is very close to complete resolution though is getting really dry at this point I think we can use some Xeroform to try to see if that would be of benefit. She is in agreement with that plan. In regard to the foot this is showing signs of improvement although it still taking some time or doing the [facility] Blue rope packing. 06/15/2021 upon evaluation today patient appears to be doing well with regard to her wounds. The leg is healed and the foot is showing signs of improvement. There does not appear to be any evidence of active infection at this time. No fevers, chills, nausea, vomiting, or diarrhea. 06/22/2021 upon evaluation today patient appears to be doing well with regard to her foot ulcer. She has been tolerating the dressing changes without complication. Fortunately there does not appear to be any signs of active infection at this time. No fevers, chills, nausea, vomiting, or diarrhea. 07/05/2021 upon evaluation today patient appears to be doing awesome in regard to her foot ulcer. This is pretty much about completely closed there is may be a small 0.1 opening but nothing significant whatsoever at this time. I am extremely happy with where we stand. Objective Constitutional Obese and well-hydrated in no acute distress. Vitals Time Taken: 3:39 PM, Height: 68 in, Weight: 278 lbs, BMI: 42.3, Temperature: 98.3 F, Pulse: 58 bpm, Respiratory Rate: 18 breaths/min, Blood Pressure: 166/74 mmHg. Respiratory normal breathing without difficulty. NAAVYA, POSTMA ([id number]) Psychiatric this patient is able to make decisions and  demonstrates good insight into disease process. Alert and Oriented x 3. pleasant and cooperative. General Notes: Upon inspection patient's wound bed  showed signs of good granulation and epithelization there was just a very small area of opening centrally noted at this point. There really does not appear to be any significant slough buildup and really no significant drainage. It was very close to complete resolution today but I do not think were quite there yet. Hopefully we will be by next week. Integumentary (Hair, Skin) Wound #10 status is Open. Original cause of wound was Surgical Injury. The date acquired was: 12/05/2020. The wound has been in treatment 6 weeks. The wound is located on the Left,Dorsal Foot. The wound measures 0.1cm length x 0.1cm width x 0.1cm depth; 0.008cm^2 area and 0.001cm^3 volume. There is Fat Layer (Subcutaneous Tissue) exposed. There is no tunneling or undermining noted. There is a none present amount of drainage noted. There is no granulation within the wound bed. There is no necrotic tissue within the wound bed. Assessment Active Problems ICD-10 Type 2 diabetes mellitus with foot ulcer Non-pressure chronic ulcer of other part of left foot with fat layer exposed Laceration without foreign body, left lower leg, initial encounter Chronic kidney disease, stage 4 (severe) Chronic combined systolic (congestive) and diastolic (congestive) heart failure Essential (primary) hypertension Plan Follow-up Appointments: Return Appointment in 1 week. Home Health: [facility]: - [place] for wound care. May utilize formulary equivalent dressing for wound treatment orders unless otherwise specified. Home Health Nurse may visit PRN to address patient s wound care needs. Scheduled days for dressing changes to be completed; exception, patient has scheduled wound care visit that day. **Please direct any NON-WOUND related issues/requests for orders to  patient's Primary Care Physician. **If current dressing causes regression in wound condition, may D/C ordered dressing product/s and apply Normal Saline Moist Dressing daily until next [place] or Other MD appointment. **Notify Wound Healing Center of regression in wound condition at [phone]. Bathing/ Shower/ Hygiene: May shower; gently cleanse wound with antibacterial soap, rinse and pat dry prior to dressing wounds WOUND #10: - Foot Wound Laterality: Dorsal, Left Cleanser: Normal Saline 3 x Per Week/30 Days Discharge Instructions: Wash your hands with soap and water. Remove old dressing, discard into plastic bag and place into trash. Cleanse the wound with Normal Saline prior to applying a clean dressing using gauze sponges, not tissues or cotton balls. Do not scrub or use excessive force. Pat dry using gauze sponges, not tissue or cotton balls. Primary Dressing: Algicell Calcium Alginate Dressing 2x2 (in/in) 3 x Per Week/30 Days Discharge Instructions: Apply to wound Secondary Dressing: Mepilex Border Flex, 4x4 (in/in) 3 x Per Week/30 Days Discharge Instructions: Apply to wound as directed. Do not cut. 1. Would recommend currently that we going to continue with wound care measures as before and the patient is in agreement with plan although we will switch to a plain alginate dressing versus anything silver as it will think she needs this any longer. 2. I am also can recommend that we continue with the border foam to cover. We will see patient back for reevaluation in 1 week here in the clinic. If anything worsens or changes patient will contact our office for additional recommendations. Electronic Signature(s) Signed: 07/05/2021 5:05:01 PM By: Worthy Keeler PA-C Entered By: Worthy Keeler on 07/05/2021 17:05:01 [name] ([id number]) -------------------------------------------------------------------------------- SuperBill Details Patient Name: [name] Date of Service: 07/05/2021 Medical Record Number: [id number] Patient Account Number: [id number] Date of Birth/Sex: [birth year] (65 y.o. 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 S81.812A Laceration without foreign body, left lower leg, initial encounter N18.4 Chronic kidney disease, stage 4 (severe) I50.42 Chronic combined systolic (congestive) and diastolic (congestive) heart failure I10 Essential (primary) hypertension Facility Procedures CPT4 Code: [id number] Description: 99213 - WOUND CARE VISIT-LEV 3 EST PT Modifier: Quantity: 1 Physician Procedures CPT4 Code: 3382505 Description: 99213 - WC PHYS LEVEL 3 - EST PT Modifier: Quantity: 1 CPT4 Code: Description: ICD-10 Diagnosis Description E11.621 Type 2 diabetes mellitus with foot ulcer L97.522 Non-pressure chronic ulcer of other part of left foot with fat layer ex L97.673A Laceration without foreign body, left lower leg, initial encounter N18.4  Chronic kidney disease, stage 4 (severe) Modifier: posed Quantity: Electronic Signature(s) Signed: 07/05/2021 5:05:30 PM By: Worthy Keeler PA-C Previous Signature: 07/05/2021 4:16:02 PM Version By: Dolan Amen RN Entered By: Worthy Keeler on 07/05/2021 17:05:29

## 2021-07-05 NOTE — Progress Notes (Signed)
Description Classification: Full Thickness Without Exposed Support Structures Exudate Amount: None Present Foul Odor After Cleansing: No Slough/Fibrino No Wound Bed Granulation Amount: None Present (0%) Exposed Structure Necrotic Amount: None Present (0%) Fascia Exposed: No Fat Layer (Subcutaneous Tissue) Exposed: Yes Tendon Exposed: No Muscle Exposed: No Joint Exposed:  No Bone Exposed: No Treatment Notes Wound #10 (Foot) Wound Laterality: Dorsal, Left Cleanser Normal Saline Discharge Instruction: Wash your hands with soap and water. Remove old dressing, discard into plastic bag and place into trash. Cleanse the wound with Normal Saline prior to applying a clean dressing using gauze sponges, not tissues or cotton balls. Do not scrub or use excessive force. Pat dry using gauze sponges, not tissue or cotton balls. [name], [name] Peri-Wound Care Topical Primary Dressing Algicell Calcium Alginate Dressing 2x2 (in/in) Discharge Instruction: Apply to wound Secondary Dressing Mepilex Border Flex, 4x4 (in/in) Discharge Instruction: Apply to wound as directed. Do not cut. Secured With Compression Wrap Compression Stockings Add-Ons Electronic Signature(s) Signed: 07/05/2021 4:16:02 PM By: Dolan Amen RN Entered By: Dolan Amen on 07/05/2021 15:44:58 [name] ([id number]) -------------------------------------------------------------------------------- [place] Details Patient Name: [name] Date of Service: 07/05/2021 3:30 PM Medical Record Number: [id number] Patient Account Number: [id number] Date of Birth/Sex: 08/28/1956 (65 y.o. F) Treating RN: Dolan Amen Primary Care Arless Vineyard: Elsie Stain Other Clinician: Referring Augusten Lipkin: Elsie Stain Treating Salem Lembke/Extender: Skipper Cliche in Treatment: 6 Vital Signs Time Taken: 15:39 Temperature (F): 98.3 Height (in): 68 Pulse (bpm): 58 Weight (lbs): 278 Respiratory Rate (breaths/min): 18 Body Mass Index (BMI): 42.3 Blood Pressure (mmHg): 166/74 Reference Range: 80 - 120 mg / dl Electronic Signature(s) Signed: 07/05/2021 4:16:02 PM By: Dolan Amen RN Entered By: Dolan Amen on 07/05/2021 15:43:06  on 07/05/2021 15:57:44 [name], [name] ([id number]) -------------------------------------------------------------------------------- [place] Details Patient Name: [name], [name] Date of Service: 07/05/2021 3:30 PM Medical Record Number: [id number] Patient Account Number: [id number] Date of Birth/Sex: 02-21-1956 (65 y.o. F) Treating RN: Dolan Amen Primary Care Tyshell Ramberg: Elsie Stain Other Clinician: Referring Kitara Hebb: Elsie Stain Treating Melanie Openshaw/Extender: Skipper Cliche in Treatment: 6 Active Inactive Wound/Skin Impairment Nursing Diagnoses: Impaired tissue integrity Goals: Patient/caregiver will verbalize understanding of skin care regimen Date Initiated: 05/22/2021 Date Inactivated: 06/22/2021 Target Resolution Date: 05/22/2021 Goal Status: Met Ulcer/skin breakdown will have a volume reduction of 30% by week 4 Date Initiated: 05/22/2021 Date Inactivated: 06/22/2021 Target Resolution Date: 06/22/2021 Goal Status: Met Ulcer/skin breakdown will have a volume reduction of 50% by week 8 Date Initiated: 05/22/2021 Date Inactivated: 07/05/2021 Target Resolution Date: 07/22/2021 Goal Status: Met Ulcer/skin breakdown will have a volume reduction of 80% by week 12 Date Initiated: 05/22/2021 Date Inactivated: 07/05/2021 Target Resolution Date: 08/22/2021 Goal  Status: Met Ulcer/skin breakdown will heal within 14 weeks Date Initiated: 05/22/2021 Target Resolution Date: 09/21/2021 Goal Status: Active Interventions: Assess patient/caregiver ability to obtain necessary supplies Assess patient/caregiver ability to perform ulcer/skin care regimen upon admission and as needed Assess ulceration(s) every visit Provide education on ulcer and skin care Treatment Activities: Referred to DME Niasha Devins for dressing supplies : 05/22/2021 Skin care regimen initiated : 05/22/2021 Notes: Electronic Signature(s) Signed: 07/05/2021 4:16:02 PM By: Dolan Amen RN Entered By: Dolan Amen on 07/05/2021 15:57:37 [name] ([id number]) -------------------------------------------------------------------------------- Pain Assessment Details Patient Name: [name] Date of Service: 07/05/2021 3:30 PM Medical Record Number: [id number] Patient Account Number: [id number] Date of Birth/Sex: [birth year] (65 y.o. F) Treating RN: Dolan Amen Primary Care Shannyn Jankowiak: Elsie Stain Other Clinician: Referring Demitria Hay: Elsie Stain Treating Tyjae Shvartsman/Extender: Skipper Cliche in Treatment: 6 Active Problems Location of Pain Severity and Description of Pain Patient Has Paino No Site Locations Rate the pain. Current Pain Level: 0 Pain Management and Medication Current Pain Management: Electronic Signature(s) Signed: 07/05/2021 4:16:02 PM By: Dolan Amen RN Entered By: Dolan Amen on 07/05/2021 15:43:39 [name] ([id number]) -------------------------------------------------------------------------------- Patient/Caregiver Education Details Patient Name: [name] Date of Service: 07/05/2021 3:30 PM Medical Record Number: [id number] Patient Account Number: [id number] Date of Birth/Gender: [birth year] (65 y.o. F) Treating RN: Dolan Amen Primary Care Physician: Elsie Stain Other Clinician: Referring Physician: Elsie Stain Treating Physician/Extender: Skipper Cliche in Treatment: 6 Education Assessment Education Provided To: Patient Education Topics Provided Wound/Skin Impairment: Methods: Explain/Verbal Responses: State content correctly Electronic Signature(s) Signed: 07/05/2021 4:16:02 PM By: Dolan Amen RN Entered By: Dolan Amen on 07/05/2021 15:58:19 [name] ([id number]) -------------------------------------------------------------------------------- Wound Assessment Details Patient Name: [name] Date of Service: 07/05/2021 3:30 PM Medical Record Number: [id number] Patient Account Number: [id number] Date of Birth/Sex: 11/21/1955 (65 y.o. F) Treating RN: Dolan Amen Primary Care Rowyn Spilde: Elsie Stain Other Clinician: Referring Naseer Hearn: Elsie Stain Treating Caedmon Louque/Extender: Jeri Cos Weeks in Treatment: 6 Wound Status Wound Number: 10 Primary Dehisced Wound Etiology: Wound Location: Left, Dorsal Foot Wound Open Wounding Event: Surgical Injury Status: Date Acquired: 12/05/2020 Comorbid Anemia, Arrhythmia, Congestive Heart Failure, Weeks Of Treatment: 6 History: Hypertension, Myocardial Infarction, Type II Diabetes, End Clustered Wound: No Stage Renal Disease, Osteoarthritis, Osteomyelitis, Neuropathy, Seizure Disorder Photos Wound Measurements Length: (cm) 0.1 Width: (cm) 0.1 Depth: (cm) 0.1 Area: (cm) 0.008 Volume: (cm) 0.001 % Reduction in Area: 93.2% % Reduction in Volume: 99.7% Epithelialization: Large (67-100%) Tunneling: No Undermining: No Wound  on 07/05/2021 15:57:44 [name], [name] ([id number]) -------------------------------------------------------------------------------- [place] Details Patient Name: [name], [name] Date of Service: 07/05/2021 3:30 PM Medical Record Number: [id number] Patient Account Number: [id number] Date of Birth/Sex: 02-21-1956 (65 y.o. F) Treating RN: Dolan Amen Primary Care Tyshell Ramberg: Elsie Stain Other Clinician: Referring Kitara Hebb: Elsie Stain Treating Melanie Openshaw/Extender: Skipper Cliche in Treatment: 6 Active Inactive Wound/Skin Impairment Nursing Diagnoses: Impaired tissue integrity Goals: Patient/caregiver will verbalize understanding of skin care regimen Date Initiated: 05/22/2021 Date Inactivated: 06/22/2021 Target Resolution Date: 05/22/2021 Goal Status: Met Ulcer/skin breakdown will have a volume reduction of 30% by week 4 Date Initiated: 05/22/2021 Date Inactivated: 06/22/2021 Target Resolution Date: 06/22/2021 Goal Status: Met Ulcer/skin breakdown will have a volume reduction of 50% by week 8 Date Initiated: 05/22/2021 Date Inactivated: 07/05/2021 Target Resolution Date: 07/22/2021 Goal Status: Met Ulcer/skin breakdown will have a volume reduction of 80% by week 12 Date Initiated: 05/22/2021 Date Inactivated: 07/05/2021 Target Resolution Date: 08/22/2021 Goal  Status: Met Ulcer/skin breakdown will heal within 14 weeks Date Initiated: 05/22/2021 Target Resolution Date: 09/21/2021 Goal Status: Active Interventions: Assess patient/caregiver ability to obtain necessary supplies Assess patient/caregiver ability to perform ulcer/skin care regimen upon admission and as needed Assess ulceration(s) every visit Provide education on ulcer and skin care Treatment Activities: Referred to DME Niasha Devins for dressing supplies : 05/22/2021 Skin care regimen initiated : 05/22/2021 Notes: Electronic Signature(s) Signed: 07/05/2021 4:16:02 PM By: Dolan Amen RN Entered By: Dolan Amen on 07/05/2021 15:57:37 [name] ([id number]) -------------------------------------------------------------------------------- Pain Assessment Details Patient Name: [name] Date of Service: 07/05/2021 3:30 PM Medical Record Number: [id number] Patient Account Number: [id number] Date of Birth/Sex: [birth year] (65 y.o. F) Treating RN: Dolan Amen Primary Care Shannyn Jankowiak: Elsie Stain Other Clinician: Referring Demitria Hay: Elsie Stain Treating Tyjae Shvartsman/Extender: Skipper Cliche in Treatment: 6 Active Problems Location of Pain Severity and Description of Pain Patient Has Paino No Site Locations Rate the pain. Current Pain Level: 0 Pain Management and Medication Current Pain Management: Electronic Signature(s) Signed: 07/05/2021 4:16:02 PM By: Dolan Amen RN Entered By: Dolan Amen on 07/05/2021 15:43:39 [name] ([id number]) -------------------------------------------------------------------------------- Patient/Caregiver Education Details Patient Name: [name] Date of Service: 07/05/2021 3:30 PM Medical Record Number: [id number] Patient Account Number: [id number] Date of Birth/Gender: [birth year] (65 y.o. F) Treating RN: Dolan Amen Primary Care Physician: Elsie Stain Other Clinician: Referring Physician: Elsie Stain Treating Physician/Extender: Skipper Cliche in Treatment: 6 Education Assessment Education Provided To: Patient Education Topics Provided Wound/Skin Impairment: Methods: Explain/Verbal Responses: State content correctly Electronic Signature(s) Signed: 07/05/2021 4:16:02 PM By: Dolan Amen RN Entered By: Dolan Amen on 07/05/2021 15:58:19 [name] ([id number]) -------------------------------------------------------------------------------- Wound Assessment Details Patient Name: [name] Date of Service: 07/05/2021 3:30 PM Medical Record Number: [id number] Patient Account Number: [id number] Date of Birth/Sex: 11/21/1955 (65 y.o. F) Treating RN: Dolan Amen Primary Care Rowyn Spilde: Elsie Stain Other Clinician: Referring Naseer Hearn: Elsie Stain Treating Caedmon Louque/Extender: Jeri Cos Weeks in Treatment: 6 Wound Status Wound Number: 10 Primary Dehisced Wound Etiology: Wound Location: Left, Dorsal Foot Wound Open Wounding Event: Surgical Injury Status: Date Acquired: 12/05/2020 Comorbid Anemia, Arrhythmia, Congestive Heart Failure, Weeks Of Treatment: 6 History: Hypertension, Myocardial Infarction, Type II Diabetes, End Clustered Wound: No Stage Renal Disease, Osteoarthritis, Osteomyelitis, Neuropathy, Seizure Disorder Photos Wound Measurements Length: (cm) 0.1 Width: (cm) 0.1 Depth: (cm) 0.1 Area: (cm) 0.008 Volume: (cm) 0.001 % Reduction in Area: 93.2% % Reduction in Volume: 99.7% Epithelialization: Large (67-100%) Tunneling: No Undermining: No Wound  multiple wounds []  - 0 Large Wound Dressing one or multiple wounds []  - 0 Application of Medications - topical []  - 0 Application of Medications - injection INTERVENTIONS - Miscellaneous []  - External ear exam 0 []  - 0 Specimen Collection (cultures, biopsies, blood, body fluids, etc.) []  - 0 Specimen(s) / Culture(s) sent or taken to Lab for analysis []  - 0 Patient Transfer (multiple staff / [employer] fast streamer / Similar devices) []  - 0 Simple Staple / Suture removal (25 or less) []  - 0 Complex Staple / Suture removal (26 or more) []  - 0 Hypo / Hyperglycemic Management (close monitor of Blood Glucose) []  - 0 Ankle / Brachial Index (ABI) - do not check if billed separately X- 1 5 Vital Signs Has the patient been seen at the hospital within the last three years: Yes Total Score: 80 Level Of Care: New/Established - Level 3 Electronic Signature(s) Signed: 07/05/2021 4:16:02 PM By: Dolan Amen RN Entered By: Dolan Amen on 07/05/2021 15:58:07 [name] ([id number]) -------------------------------------------------------------------------------- Encounter Discharge Information Details Patient Name: [name] Date of Service: 07/05/2021 3:30 PM Medical Record Number: [id number] Patient Account Number: [id number] Date of Birth/Sex: Feb 02, 1956 (65 y.o. F) Treating RN: Dolan Amen Primary Care Elma Limas: Elsie Stain Other Clinician: Referring Alesana Magistro: Elsie Stain Treating Jenice Leiner/Extender: Skipper Cliche in Treatment: 6 Encounter Discharge Information Items Discharge Condition: Stable Ambulatory Status: Ambulatory Discharge Destination: Home Transportation: Private Auto Accompanied By: self Schedule Follow-up Appointment: Yes Clinical Summary of Care: Electronic Signature(s) Signed: 07/05/2021  4:16:02 PM By: Dolan Amen RN Entered By: Dolan Amen on 07/05/2021 15:58:42 [name] ([id number]) -------------------------------------------------------------------------------- Lower Extremity Assessment Details Patient Name: [name] Date of Service: 07/05/2021 3:30 PM Medical Record Number: [id number] Patient Account Number: [id number] Date of Birth/Sex: Sep 07, 1956 (65 y.o. F) Treating RN: Dolan Amen Primary Care Dajae Kizer: Elsie Stain Other Clinician: Referring Ashland Osmer: Elsie Stain Treating Kaytelyn Glore/Extender: Jeri Cos Weeks in Treatment: 6 Electronic Signature(s) Signed: 07/05/2021 4:16:02 PM By: Dolan Amen RN Entered By: Dolan Amen on 07/05/2021 15:45:14 [name] ([id number]) -------------------------------------------------------------------------------- Multi Wound Chart Details Patient Name: [name] Date of Service: 07/05/2021 3:30 PM Medical Record Number: [id number] Patient Account Number: [id number] Date of Birth/Sex: 1956-03-09 (65 y.o. F) Treating RN: Dolan Amen Primary Care Wyland Rastetter: Elsie Stain Other Clinician: Referring Alexsys Eskin: Elsie Stain Treating Mathias Bogacki/Extender: Skipper Cliche in Treatment: 6 Vital Signs Height(in): 68 Pulse(bpm): 38 Weight(lbs): 278 Blood Pressure(mmHg): 166/74 Body Mass Index(BMI): 42 Temperature(F): 98.3 Respiratory Rate(breaths/min): 18 Photos: [N/A:N/A] Wound Location: Left, Dorsal Foot N/A N/A Wounding Event: Surgical Injury N/A N/A Primary Etiology: Dehisced Wound N/A N/A Comorbid History: Anemia, Arrhythmia, Congestive N/A N/A Heart Failure, Hypertension, Myocardial Infarction, Type II Diabetes, End Stage Renal Disease, Osteoarthritis, Osteomyelitis, Neuropathy, Seizure Disorder Date Acquired: 12/05/2020 N/A N/A Weeks of Treatment: 6 N/A N/A Wound Status: Open N/A N/A Measurements L x W x D (cm) 0.1x0.1x0.1 N/A N/A Area (cm) : 0.008 N/A  N/A Volume (cm) : 0.001 N/A N/A % Reduction in Area: 93.20% N/A N/A % Reduction in Volume: 99.70% N/A N/A Classification: Full Thickness Without Exposed N/A N/A Support Structures Exudate Amount: None Present N/A N/A Granulation Amount: None Present (0%) N/A N/A Necrotic Amount: None Present (0%) N/A N/A Exposed Structures: Fat Layer (Subcutaneous Tissue): N/A N/A Yes Fascia: No Tendon: No Muscle: No Joint: No Bone: No Epithelialization: Large (67-100%) N/A N/A Treatment Notes Electronic Signature(s) Signed: 07/05/2021 4:16:02 PM By: Dolan Amen RN Entered By: Dolan Amen

## 2021-07-05 NOTE — Telephone Encounter (Signed)
Refill request for Gabapentin 600 mg tablets  LOV - 02/15/21 Next OV - not scheduled Last refill - 10/02/20

## 2021-07-06 DIAGNOSIS — I2510 Atherosclerotic heart disease of native coronary artery without angina pectoris: Secondary | ICD-10-CM | POA: Diagnosis not present

## 2021-07-06 DIAGNOSIS — E1165 Type 2 diabetes mellitus with hyperglycemia: Secondary | ICD-10-CM | POA: Diagnosis not present

## 2021-07-06 DIAGNOSIS — E1142 Type 2 diabetes mellitus with diabetic polyneuropathy: Secondary | ICD-10-CM | POA: Diagnosis not present

## 2021-07-06 DIAGNOSIS — I5022 Chronic systolic (congestive) heart failure: Secondary | ICD-10-CM

## 2021-07-06 DIAGNOSIS — N184 Chronic kidney disease, stage 4 (severe): Secondary | ICD-10-CM

## 2021-07-06 DIAGNOSIS — I10 Essential (primary) hypertension: Secondary | ICD-10-CM

## 2021-07-06 DIAGNOSIS — E1129 Type 2 diabetes mellitus with other diabetic kidney complication: Secondary | ICD-10-CM | POA: Diagnosis not present

## 2021-07-06 DIAGNOSIS — E039 Hypothyroidism, unspecified: Secondary | ICD-10-CM | POA: Diagnosis not present

## 2021-07-06 DIAGNOSIS — E7800 Pure hypercholesterolemia, unspecified: Secondary | ICD-10-CM | POA: Diagnosis not present

## 2021-07-06 NOTE — Telephone Encounter (Signed)
Please schedule follow-up when possible this fall.  Sent. Thanks.

## 2021-07-09 ENCOUNTER — Encounter: Payer: Self-pay | Admitting: Family Medicine

## 2021-07-09 DIAGNOSIS — I5022 Chronic systolic (congestive) heart failure: Secondary | ICD-10-CM | POA: Diagnosis not present

## 2021-07-09 DIAGNOSIS — M86171 Other acute osteomyelitis, right ankle and foot: Secondary | ICD-10-CM | POA: Diagnosis not present

## 2021-07-09 DIAGNOSIS — J189 Pneumonia, unspecified organism: Secondary | ICD-10-CM | POA: Diagnosis not present

## 2021-07-09 DIAGNOSIS — G4733 Obstructive sleep apnea (adult) (pediatric): Secondary | ICD-10-CM | POA: Diagnosis not present

## 2021-07-09 NOTE — Telephone Encounter (Signed)
Sent letter via my chart.

## 2021-07-09 NOTE — Telephone Encounter (Signed)
Called patient no answer.

## 2021-07-10 DIAGNOSIS — I252 Old myocardial infarction: Secondary | ICD-10-CM | POA: Diagnosis not present

## 2021-07-10 DIAGNOSIS — E1151 Type 2 diabetes mellitus with diabetic peripheral angiopathy without gangrene: Secondary | ICD-10-CM | POA: Diagnosis not present

## 2021-07-10 DIAGNOSIS — N184 Chronic kidney disease, stage 4 (severe): Secondary | ICD-10-CM | POA: Diagnosis not present

## 2021-07-10 DIAGNOSIS — E039 Hypothyroidism, unspecified: Secondary | ICD-10-CM | POA: Diagnosis not present

## 2021-07-10 DIAGNOSIS — D631 Anemia in chronic kidney disease: Secondary | ICD-10-CM | POA: Diagnosis not present

## 2021-07-10 DIAGNOSIS — Z4781 Encounter for orthopedic aftercare following surgical amputation: Secondary | ICD-10-CM | POA: Diagnosis not present

## 2021-07-10 DIAGNOSIS — E1122 Type 2 diabetes mellitus with diabetic chronic kidney disease: Secondary | ICD-10-CM | POA: Diagnosis not present

## 2021-07-10 DIAGNOSIS — I5030 Unspecified diastolic (congestive) heart failure: Secondary | ICD-10-CM | POA: Diagnosis not present

## 2021-07-10 DIAGNOSIS — F32A Depression, unspecified: Secondary | ICD-10-CM | POA: Diagnosis not present

## 2021-07-10 DIAGNOSIS — E1142 Type 2 diabetes mellitus with diabetic polyneuropathy: Secondary | ICD-10-CM | POA: Diagnosis not present

## 2021-07-10 DIAGNOSIS — S81802D Unspecified open wound, left lower leg, subsequent encounter: Secondary | ICD-10-CM | POA: Diagnosis not present

## 2021-07-10 DIAGNOSIS — I130 Hypertensive heart and chronic kidney disease with heart failure and stage 1 through stage 4 chronic kidney disease, or unspecified chronic kidney disease: Secondary | ICD-10-CM | POA: Diagnosis not present

## 2021-07-10 DIAGNOSIS — I2510 Atherosclerotic heart disease of native coronary artery without angina pectoris: Secondary | ICD-10-CM | POA: Diagnosis not present

## 2021-07-10 DIAGNOSIS — I428 Other cardiomyopathies: Secondary | ICD-10-CM | POA: Diagnosis not present

## 2021-07-10 DIAGNOSIS — E785 Hyperlipidemia, unspecified: Secondary | ICD-10-CM | POA: Diagnosis not present

## 2021-07-10 DIAGNOSIS — M869 Osteomyelitis, unspecified: Secondary | ICD-10-CM | POA: Diagnosis not present

## 2021-07-10 DIAGNOSIS — I4891 Unspecified atrial fibrillation: Secondary | ICD-10-CM | POA: Diagnosis not present

## 2021-07-10 DIAGNOSIS — I051 Rheumatic mitral insufficiency: Secondary | ICD-10-CM | POA: Diagnosis not present

## 2021-07-10 DIAGNOSIS — Z89422 Acquired absence of other left toe(s): Secondary | ICD-10-CM | POA: Diagnosis not present

## 2021-07-10 DIAGNOSIS — E1169 Type 2 diabetes mellitus with other specified complication: Secondary | ICD-10-CM | POA: Diagnosis not present

## 2021-07-10 DIAGNOSIS — I5023 Acute on chronic systolic (congestive) heart failure: Secondary | ICD-10-CM | POA: Diagnosis not present

## 2021-07-10 DIAGNOSIS — I447 Left bundle-branch block, unspecified: Secondary | ICD-10-CM | POA: Diagnosis not present

## 2021-07-10 DIAGNOSIS — I70202 Unspecified atherosclerosis of native arteries of extremities, left leg: Secondary | ICD-10-CM | POA: Diagnosis not present

## 2021-07-10 DIAGNOSIS — G4733 Obstructive sleep apnea (adult) (pediatric): Secondary | ICD-10-CM | POA: Diagnosis not present

## 2021-07-10 DIAGNOSIS — I255 Ischemic cardiomyopathy: Secondary | ICD-10-CM | POA: Diagnosis not present

## 2021-07-11 ENCOUNTER — Encounter (HOSPITAL_COMMUNITY): Payer: HMO

## 2021-07-12 ENCOUNTER — Encounter: Payer: HMO | Attending: Physician Assistant | Admitting: Physician Assistant

## 2021-07-12 ENCOUNTER — Other Ambulatory Visit: Payer: Self-pay

## 2021-07-12 DIAGNOSIS — I2510 Atherosclerotic heart disease of native coronary artery without angina pectoris: Secondary | ICD-10-CM | POA: Diagnosis not present

## 2021-07-12 DIAGNOSIS — I132 Hypertensive heart and chronic kidney disease with heart failure and with stage 5 chronic kidney disease, or end stage renal disease: Secondary | ICD-10-CM | POA: Insufficient documentation

## 2021-07-12 DIAGNOSIS — T8189XA Other complications of procedures, not elsewhere classified, initial encounter: Secondary | ICD-10-CM | POA: Diagnosis not present

## 2021-07-12 DIAGNOSIS — G40909 Epilepsy, unspecified, not intractable, without status epilepticus: Secondary | ICD-10-CM | POA: Diagnosis not present

## 2021-07-12 DIAGNOSIS — E11621 Type 2 diabetes mellitus with foot ulcer: Secondary | ICD-10-CM | POA: Insufficient documentation

## 2021-07-12 DIAGNOSIS — E785 Hyperlipidemia, unspecified: Secondary | ICD-10-CM | POA: Insufficient documentation

## 2021-07-12 DIAGNOSIS — I5042 Chronic combined systolic (congestive) and diastolic (congestive) heart failure: Secondary | ICD-10-CM | POA: Diagnosis not present

## 2021-07-12 DIAGNOSIS — N184 Chronic kidney disease, stage 4 (severe): Secondary | ICD-10-CM | POA: Insufficient documentation

## 2021-07-12 DIAGNOSIS — E1122 Type 2 diabetes mellitus with diabetic chronic kidney disease: Secondary | ICD-10-CM | POA: Insufficient documentation

## 2021-07-12 DIAGNOSIS — S81812A Laceration without foreign body, left lower leg, initial encounter: Secondary | ICD-10-CM | POA: Insufficient documentation

## 2021-07-12 DIAGNOSIS — I252 Old myocardial infarction: Secondary | ICD-10-CM | POA: Diagnosis not present

## 2021-07-12 DIAGNOSIS — Z6841 Body Mass Index (BMI) 40.0 and over, adult: Secondary | ICD-10-CM | POA: Insufficient documentation

## 2021-07-12 DIAGNOSIS — M1990 Unspecified osteoarthritis, unspecified site: Secondary | ICD-10-CM | POA: Insufficient documentation

## 2021-07-12 DIAGNOSIS — L97522 Non-pressure chronic ulcer of other part of left foot with fat layer exposed: Secondary | ICD-10-CM | POA: Diagnosis not present

## 2021-07-12 DIAGNOSIS — X58XXXA Exposure to other specified factors, initial encounter: Secondary | ICD-10-CM | POA: Insufficient documentation

## 2021-07-12 NOTE — Progress Notes (Addendum)
NAHIARA, ECKBERG ([id number]) Visit Report for 07/12/2021 Arrival Information Details Patient Name: [name], [name] Date of Service: 07/12/2021 10:45 AM Medical Record Number: [id number] Patient Account Number: [id number] Date of Birth/Sex: 1955-11-09 (65 y.o. F) Treating RN: Rogers Blocker Primary Care Karie Skowron: Crawford Givens Other Clinician: Referring Kaylla Cobos: Crawford Givens Treating Ether Wolters/Extender: Rowan Blase in Treatment: 7 Visit Information History Since Last Visit Pain Present Now: No Patient Arrived: Ambulatory Arrival Time: 10:57 Accompanied By: mother Transfer Assistance: None Patient Identification Verified: Yes Secondary Verification Process Completed: Yes Patient Has Alerts: Yes Patient Alerts: Patient on Blood Thinner ***PLAVIX*** Type II Diabetic HAS PACEMAKER Electronic Signature(s) Signed: 07/12/2021 1:42:44 PM By: Rogers Blocker RN Entered By: Rogers Blocker on 07/12/2021 10:57:16 [name] ([id number]) -------------------------------------------------------------------------------- Clinic Level of Care Assessment Details Patient Name: [name] Date of Service: 07/12/2021 10:45 AM Medical Record Number: [id number] Patient Account Number: [id number] Date of Birth/Sex: Jan 23, 1956 (65 y.o. F) Treating RN: Rogers Blocker Primary Care Izola Teague: Crawford Givens Other Clinician: Referring Levenia Skalicky: Crawford Givens Treating Lynia Landry/Extender: Rowan Blase in Treatment: 7 Clinic Level of Care Assessment Items TOOL 4 Quantity Score X - Use when only an EandM is performed on FOLLOW-UP visit 1 0 ASSESSMENTS - Nursing Assessment / Reassessment X - Reassessment of Co-morbidities (includes updates in patient status) 1 10 X- 1 5 Reassessment of Adherence to Treatment Plan ASSESSMENTS - Wound and Skin Assessment / Reassessment []  - Simple Wound Assessment / Reassessment - one wound 0 []  - 0 Complex Wound Assessment / Reassessment -  multiple wounds []  - 0 Dermatologic / Skin Assessment (not related to wound area) ASSESSMENTS - Focused Assessment []  - Circumferential Edema Measurements - multi extremities 0 []  - 0 Nutritional Assessment / Counseling / Intervention []  - 0 Lower Extremity Assessment (monofilament, tuning fork, pulses) []  - 0 Peripheral Arterial Disease Assessment (using hand held doppler) ASSESSMENTS - Ostomy and/or Continence Assessment and Care []  - Incontinence Assessment and Management 0 []  - 0 Ostomy Care Assessment and Management (repouching, etc.) PROCESS - Coordination of Care X - Simple Patient / Family Education for ongoing care 1 15 []  - 0 Complex (extensive) Patient / Family Education for ongoing care []  - 0 Staff obtains Chiropractor, Records, Test Results / Process Orders []  - 0 Staff telephones HHA, Nursing Homes / Clarify orders / etc []  - 0 Routine Transfer to another Facility (non-emergent condition) []  - 0 Routine Hospital Admission (non-emergent condition) []  - 0 New Admissions / Manufacturing engineer / Ordering NPWT, Apligraf, etc. []  - 0 Emergency Hospital Admission (emergent condition) X- 1 10 Simple Discharge Coordination []  - 0 Complex (extensive) Discharge Coordination PROCESS - Special Needs []  - Pediatric / Minor Patient Management 0 []  - 0 Isolation Patient Management []  - 0 Hearing / Language / Visual special needs []  - 0 Assessment of Community assistance (transportation, D/C planning, etc.) []  - 0 Additional assistance / Altered mentation []  - 0 Support Surface(s) Assessment (bed, cushion, seat, etc.) INTERVENTIONS - Wound Cleansing / Measurement SOLARIS, KOZLOFF ([id number]) []  - 0 Simple Wound Cleansing - one wound []  - 0 Complex Wound Cleansing - multiple wounds []  - 0 Wound Imaging (photographs - any number of wounds) []  - 0 Wound Tracing (instead of photographs) []  - 0 Simple Wound Measurement - one wound []  - 0 Complex Wound  Measurement - multiple wounds INTERVENTIONS - Wound Dressings []  - Small Wound Dressing one or multiple wounds 0 []  - 0 Medium Wound Dressing one or multiple wounds []  -  0 Large Wound Dressing one or multiple wounds []  - 0 Application of Medications - topical []  - 0 Application of Medications - injection INTERVENTIONS - Miscellaneous []  - External ear exam 0 []  - 0 Specimen Collection (cultures, biopsies, blood, body fluids, etc.) []  - 0 Specimen(s) / Culture(s) sent or taken to Lab for analysis []  - 0 Patient Transfer (multiple staff / Nurse, adult / Similar devices) []  - 0 Simple Staple / Suture removal (25 or less) []  - 0 Complex Staple / Suture removal (26 or more) []  - 0 Hypo / Hyperglycemic Management (close monitor of Blood Glucose) []  - 0 Ankle / Brachial Index (ABI) - do not check if billed separately X- 1 5 Vital Signs Has the patient been seen at the hospital within the last three years: Yes Total Score: 45 Level Of Care: New/Established - Level 2 Electronic Signature(s) Signed: 07/12/2021 1:42:44 PM By: Rogers Blocker RN Entered By: Rogers Blocker on 07/12/2021 11:17:58 [name] ([id number]) -------------------------------------------------------------------------------- Encounter Discharge Information Details Patient Name: [name] Date of Service: 07/12/2021 10:45 AM Medical Record Number: [id number] Patient Account Number: [id number] Date of Birth/Sex: Apr 26, 1956 (65 y.o. F) Treating RN: Rogers Blocker Primary Care Chanice Brenton: Crawford Givens Other Clinician: Referring Jonni Oelkers: Crawford Givens Treating Alenah Sarria/Extender: Rowan Blase in Treatment: 7 Encounter Discharge Information Items Discharge Condition: Stable Ambulatory Status: Ambulatory Discharge Destination: Home Transportation: Private Auto Accompanied By: mother Schedule Follow-up Appointment: No Clinical Summary of Care: Electronic Signature(s) Signed: 07/12/2021  1:42:44 PM By: Rogers Blocker RN Entered By: Rogers Blocker on 07/12/2021 11:18:55 [name] ([id number]) -------------------------------------------------------------------------------- Lower Extremity Assessment Details Patient Name: [name] Date of Service: 07/12/2021 10:45 AM Medical Record Number: [id number] Patient Account Number: [id number] Date of Birth/Sex: 1956-09-28 (65 y.o. F) Treating RN: Rogers Blocker Primary Care Austin Herd: Crawford Givens Other Clinician: Referring Sims Laday: Crawford Givens Treating Tiquan Bouch/Extender: Allen Derry Weeks in Treatment: 7 Electronic Signature(s) Signed: 07/12/2021 1:42:44 PM By: Rogers Blocker RN Entered By: Rogers Blocker on 07/12/2021 11:02:05 [name] ([id number]) -------------------------------------------------------------------------------- Multi Wound Chart Details Patient Name: [name] Date of Service: 07/12/2021 10:45 AM Medical Record Number: [id number] Patient Account Number: [id number] Date of Birth/Sex: 07-Aug-1956 (65 y.o. F) Treating RN: Rogers Blocker Primary Care Corita Allinson: Crawford Givens Other Clinician: Referring Esker Dever: Crawford Givens Treating Elray Dains/Extender: Rowan Blase in Treatment: 7 Vital Signs Height(in): 68 Pulse(bpm): 61 Weight(lbs): 278 Blood Pressure(mmHg): 126/69 Body Mass Index(BMI): 42 Temperature(F): 97.6 Respiratory Rate(breaths/min): 18 Photos: [N/A:N/A] Wound Location: Left, Dorsal Foot N/A N/A Wounding Event: Surgical Injury N/A N/A Primary Etiology: Dehisced Wound N/A N/A Comorbid History: Anemia, Arrhythmia, Congestive N/A N/A Heart Failure, Hypertension, Myocardial Infarction, Type II Diabetes, End Stage Renal Disease, Osteoarthritis, Osteomyelitis, Neuropathy, Seizure Disorder Date Acquired: 12/05/2020 N/A N/A Weeks of Treatment: 7 N/A N/A Wound Status: Open N/A N/A Measurements L x W x D (cm) 0x0x0 N/A N/A Area (cm) : 0 N/A N/A Volume  (cm) : 0 N/A N/A % Reduction in Area: 100.00% N/A N/A % Reduction in Volume: 100.00% N/A N/A Classification: Full Thickness Without Exposed N/A N/A Support Structures Exudate Amount: None Present N/A N/A Granulation Amount: None Present (0%) N/A N/A Necrotic Amount: None Present (0%) N/A N/A Exposed Structures: Fascia: No N/A N/A Fat Layer (Subcutaneous Tissue): No Tendon: No Muscle: No Joint: No Bone: No Epithelialization: Large (67-100%) N/A N/A Treatment Notes Electronic Signature(s) Signed: 07/12/2021 1:42:44 PM By: Rogers Blocker RN Entered By: Rogers Blocker on 07/12/2021 11:14:06 [name] ([id number]) -------------------------------------------------------------------------------- Multi-Disciplinary Care Plan Details  Patient Name: LAKEYA, HARPENAU Date of Service: 07/12/2021 10:45 AM Medical Record Number: [id number] Patient Account Number: [id number] Date of Birth/Sex: 04-16-1956 (65 y.o. F) Treating RN: Rogers Blocker Primary Care Marshall Roehrich: Crawford Givens Other Clinician: Referring Rheanna Sergent: Crawford Givens Treating Lasharn Bufkin/Extender: Allen Derry Weeks in Treatment: 7 Active Inactive Electronic Signature(s) Signed: 07/12/2021 1:42:44 PM By: Rogers Blocker RN Entered By: Rogers Blocker on 07/12/2021 11:13:58 [name] ([id number]) -------------------------------------------------------------------------------- Pain Assessment Details Patient Name: [name] Date of Service: 07/12/2021 10:45 AM Medical Record Number: [id number] Patient Account Number: [id number] Date of Birth/Sex: 09/03/1956 (65 y.o. F) Treating RN: Rogers Blocker Primary Care Deliana Avalos: Crawford Givens Other Clinician: Referring Charlese Gruetzmacher: Crawford Givens Treating Cleva Camero/Extender: Rowan Blase in Treatment: 7 Active Problems Location of Pain Severity and Description of Pain Patient Has Paino No Site Locations Pain Management and Medication Current Pain  Management: Electronic Signature(s) Signed: 07/12/2021 1:42:44 PM By: Rogers Blocker RN Entered By: Rogers Blocker on 07/12/2021 10:58:58 [name] ([id number]) -------------------------------------------------------------------------------- Patient/Caregiver Education Details Patient Name: [name] Date of Service: 07/12/2021 10:45 AM Medical Record Number: [id number] Patient Account Number: [id number] Date of Birth/Gender: Jan 30, 1956 (65 y.o. F) Treating RN: Rogers Blocker Primary Care Physician: Crawford Givens Other Clinician: Referring Physician: Crawford Givens Treating Physician/Extender: Rowan Blase in Treatment: 7 Education Assessment Education Provided To: Patient Education Topics Provided Notes discharge instructions Electronic Signature(s) Signed: 07/12/2021 1:42:44 PM By: Rogers Blocker RN Entered By: Rogers Blocker on 07/12/2021 11:18:27 [name] ([id number]) -------------------------------------------------------------------------------- Wound Assessment Details Patient Name: [name] Date of Service: 07/12/2021 10:45 AM Medical Record Number: [id number] Patient Account Number: [id number] Date of Birth/Sex: 1956-01-15 (65 y.o. F) Treating RN: Rogers Blocker Primary Care Aariel Ems: Crawford Givens Other Clinician: Referring Tony Granquist: Crawford Givens Treating Akram Kissick/Extender: Allen Derry Weeks in Treatment: 7 Wound Status Wound Number: 10 Primary Dehisced Wound Etiology: Wound Location: Left, Dorsal Foot Wound Open Wounding Event: Surgical Injury Status: Date Acquired: 12/05/2020 Comorbid Anemia, Arrhythmia, Congestive Heart Failure, Weeks Of Treatment: 7 History: Hypertension, Myocardial Infarction, Type II Diabetes, End Clustered Wound: No Stage Renal Disease, Osteoarthritis, Osteomyelitis, Neuropathy, Seizure Disorder Photos Wound Measurements Length: (cm) 0 Width: (cm) 0 Depth: (cm) 0 Area: (cm) 0 Volume: (cm)  0 % Reduction in Area: 100% % Reduction in Volume: 100% Epithelialization: Large (67-100%) Tunneling: No Undermining: No Wound Description Classification: Full Thickness Without Exposed Support Structures Exudate Amount: None Present Foul Odor After Cleansing: No Slough/Fibrino No Wound Bed Granulation Amount: None Present (0%) Exposed Structure Necrotic Amount: None Present (0%) Fascia Exposed: No Fat Layer (Subcutaneous Tissue) Exposed: No Tendon Exposed: No Muscle Exposed: No Joint Exposed: No Bone Exposed: No Electronic Signature(s) Signed: 07/12/2021 1:42:44 PM By: Rogers Blocker RN Entered By: Rogers Blocker on 07/12/2021 11:01:31 [name] ([id number]) -------------------------------------------------------------------------------- Vitals Details Patient Name: [name] Date of Service: 07/12/2021 10:45 AM Medical Record Number: [id number] Patient Account Number: [id number] Date of Birth/Sex: 10-27-1955 (65 y.o. F) Treating RN: Rogers Blocker Primary Care Aunica Dauphinee: Crawford Givens Other Clinician: Referring Lakia Gritton: Crawford Givens Treating Yasser Hepp/Extender: Rowan Blase in Treatment: 7 Vital Signs Time Taken: 10:58 Temperature (F): 97.6 Height (in): 68 Pulse (bpm): 61 Weight (lbs): 278 Respiratory Rate (breaths/min): 18 Body Mass Index (BMI): 42.3 Blood Pressure (mmHg): 126/69 Reference Range: 80 - 120 mg / dl Electronic Signature(s) Signed: 07/12/2021 1:42:44 PM By: Rogers Blocker RN Entered By: Rogers Blocker on 07/12/2021 10:58:44

## 2021-07-12 NOTE — Progress Notes (Addendum)
[name] ([id number]) Visit Report for 07/12/2021 Chief Complaint Document Details Patient Name: [name] Date of Service: 07/12/2021 10:45 AM Medical Record Number: [id number] Patient Account Number: [id number] Date of Birth/Sex: 1955-12-13 (65 y.o. F) Treating RN: Dolan Amen Primary Care Provider: Elsie Stain Other Clinician: Referring Provider: Elsie Stain Treating Provider/Extender: Skipper Cliche in Treatment: 7 Information Obtained from: Patient Chief Complaint Left LE and Left Foot Ulcer Electronic Signature(s) Signed: 07/12/2021 10:56:10 AM By: Worthy Keeler PA-C Entered By: Worthy Keeler on 07/12/2021 10:56:10 [name] ([id number]) -------------------------------------------------------------------------------- HPI Details Patient Name: [name] Date of Service: 07/12/2021 10:45 AM Medical Record Number: [id number] Patient Account Number: [id number] Date of Birth/Sex: 10-11-1955 (65 y.o. F) Treating RN: Dolan Amen Primary Care Provider: Elsie Stain Other Clinician: Referring Provider: Elsie Stain Treating Provider/Extender: Skipper Cliche in Treatment: 7 History of Present Illness HPI Description: 65 year old patient who is known to have diabetes for several years and generally has it under good control last hemoglobin A1c was 6.7 done 2 weeks ago. She has several other comorbidities including acute CHF, hypoxia, hyperlipidemia, systolic heart failure, myocardial infarction, coronary artery disease, iron deficiency anemia, renal insufficiency, essential hypertension, morbid obesity and obstructive sleep apnea. 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 In the past she's had a coronary angioplasty with stent in 2013, laparoscopic cholecystectomy in 2011, bilateral oophorectomy in April 2012 and she's had some lumbar disc surgery in 1996. She says she's had a lot  of peripheral neuropathy and due to this she's had a ulcer on her left second toe for a while. She had a dry eschar on her left heel and recently this was removed and she found that she had some infection and some drainage from there. She has minimal pain around the heel but no pain on any areas of her toes. she has not been on any antibiotics recently and has known vascular problems in the recent past. Readmission: 05/22/2021 upon evaluation today patient appears to be doing somewhat poorly in regard to the wound that is present on her left lateral lower leg as well as the left dorsal foot. The foot is very minimal and seems to be doing decently well the leg is a little bit worse. The foot is been present since about March 1 and the leg since about May 1. Subsequently the patient tells me that this is due to a trauma. She does have diabetes. With that being said she also has chronic kidney disease stage IV, congestive heart failure, and hypertension. She tells me that this is actually gotten significantly better since the initial injury. With that being said there still is a significant issue here that we do need to try to get under control. The patient voiced understanding. Nonetheless I do believe that this is going to require regular wound care here in the clinic with [place]. Patient's wounds are going also require some sharp debridement to clear away some of the necrotic tissue from both locations and I think this is absolutely appropriate based on what we are seeing today I think it would also help with getting things moving in a much better direction in general. 05/31/2021 upon evaluation today patient actually appears to be doing excellent in regard to her wounds. I am actually very pleased with where things stand today. There is no signs of active infection at this time. No fevers, chills, nausea, vomiting, or diarrhea. 06/08/2021 upon evaluation today patient  appears to be doing well with regard to  her wound. On the leg especially this is very close to complete resolution though is getting really dry at this point I think we can use some Xeroform to try to see if that would be of benefit. She is in agreement with that plan. In regard to the foot this is showing signs of improvement although it still taking some time or doing the [facility] Blue rope packing. 06/15/2021 upon evaluation today patient appears to be doing well with regard to her wounds. The leg is healed and the foot is showing signs of improvement. There does not appear to be any evidence of active infection at this time. No fevers, chills, nausea, vomiting, or diarrhea. 06/22/2021 upon evaluation today patient appears to be doing well with regard to her foot ulcer. She has been tolerating the dressing changes without complication. Fortunately there does not appear to be any signs of active infection at this time. No fevers, chills, nausea, vomiting, or diarrhea. 07/05/2021 upon evaluation today patient appears to be doing awesome in regard to her foot ulcer. This is pretty much about completely closed there is may be a small 0.1 opening but nothing significant whatsoever at this time. I am extremely happy with where we stand. 07/12/2021 on evaluation today patient's wound on her foot actually showing signs of complete resolution this has dried up she has not had any drainage over the past week I suspected it was healed last week but we were not 100% sure. I am very pleased to see this is still doing well. Electronic Signature(s) Signed: 07/12/2021 11:40:08 AM By: Worthy Keeler PA-C Entered By: Worthy Keeler on 07/12/2021 11:40:08 [name] ([id number]) -------------------------------------------------------------------------------- Physical Exam Details Patient Name: [name] Date of Service: 07/12/2021 10:45 AM Medical Record Number: [id number] Patient Account Number: [id number] Date of Birth/Sex: [birth year]  (65 y.o. F) Treating RN: Dolan Amen Primary Care Provider: Elsie Stain Other Clinician: Referring Provider: Elsie Stain Treating Provider/Extender: Jeri Cos Weeks in Treatment: 7 Constitutional Well-nourished and well-hydrated in no acute distress. Respiratory normal breathing without difficulty. Psychiatric this patient is able to make decisions and demonstrates good insight into disease process. Alert and Oriented x 3. pleasant and cooperative. Notes Upon evaluation patient's foot actually appears to be completely healed which is great news I see no signs of opening and this is awesome as well. Regular go ahead and discontinue wound care services at this point. Electronic Signature(s) Signed: 07/13/2021 8:19:04 AM By: Worthy Keeler PA-C Entered By: Worthy Keeler on 07/13/2021 08:19:04 [name] ([id number]) -------------------------------------------------------------------------------- Physician Orders Details Patient Name: [name] Date of Service: 07/12/2021 10:45 AM Medical Record Number: [id number] Patient Account Number: [id number] Date of Birth/Sex: 09/02/1956 (65 y.o. 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 S81.812A Laceration without foreign body, left lower leg, initial encounter N18.4 Chronic kidney disease, stage 4 (severe) I50.42 Chronic combined systolic (congestive) and diastolic (congestive) heart failure I10 Essential (primary) hypertension Discharge From [facility] Services o Discharge from [place] surgical shoe for 2 weeks and then patient able to  transition to regular shoe Electronic Signature(s) Signed: 07/12/2021 1:42:44 PM By: Laurin Coder,  Minus Breeding RN Signed: 07/13/2021 9:28:39 AM By: Worthy Keeler PA-C Entered By: Dolan Amen on 07/12/2021 11:15:18 [name] ([id number]) -------------------------------------------------------------------------------- Problem List Details Patient Name: [name] Date of Service: 07/12/2021 10:45 AM Medical Record Number: [id number] Patient Account Number: [id number] Date of Birth/Sex: [birth year] (65 y.o. F) Treating RN: Dolan Amen Primary Care Provider: Elsie Stain Other Clinician: Referring Provider: Elsie Stain Treating Provider/Extender: Skipper Cliche in Treatment: 7 Active Problems ICD-10 Encounter Code Description Active Date MDM Diagnosis E11.621 Type 2 diabetes mellitus with foot ulcer 05/22/2021 No Yes L97.522 Non-pressure chronic ulcer of other part of left foot with fat layer 05/22/2021 No Yes exposed S81.812A Laceration without foreign body, left lower leg, initial encounter 05/22/2021 No Yes N18.4 Chronic kidney disease, stage 4 (severe) 05/22/2021 No Yes I50.42 Chronic combined systolic (congestive) and diastolic (congestive) heart 05/22/2021 No Yes failure I10 Essential (primary) hypertension 05/22/2021 No Yes Inactive Problems Resolved Problems Electronic Signature(s) Signed: 07/12/2021 10:55:58 AM By: Worthy Keeler PA-C Entered By: Worthy Keeler on 07/12/2021 10:55:57 [name] ([id number]) -------------------------------------------------------------------------------- Progress Note Details Patient Name: [name] Date of Service: 07/12/2021 10:45 AM Medical Record Number: [id number] Patient Account Number: [id number] Date of Birth/Sex: 1956/07/29 (65 y.o. F) Treating RN: Dolan Amen Primary Care Provider: Elsie Stain Other Clinician: Referring Provider: Elsie Stain Treating Provider/Extender: Skipper Cliche in  Treatment: 7 Subjective Chief Complaint Information obtained from Patient Left LE and Left Foot Ulcer History of Present Illness (HPI) 65 year old patient who is known to have diabetes for several years and generally has it under good control last hemoglobin A1c was 6.7 done 2 weeks ago. She has several other comorbidities including acute CHF, hypoxia, hyperlipidemia, systolic heart failure, myocardial infarction, coronary artery disease, iron deficiency anemia, renal insufficiency, essential hypertension, morbid obesity and obstructive sleep apnea. 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 In the past she's had a coronary angioplasty with stent in 2013, laparoscopic cholecystectomy in 2011, bilateral oophorectomy in April 2012 and she's had some lumbar disc surgery in 1996. She says she's had a lot of peripheral neuropathy and due to this she's had a ulcer on her left second toe for a while. She had a dry eschar on her left heel and recently this was removed and she found that she had some infection and some drainage from there. She has minimal pain around the heel but no pain on any areas of her toes. she has not been on any antibiotics recently and has known vascular problems in the recent past. Readmission: 05/22/2021 upon evaluation today patient appears to be doing somewhat poorly in regard to the wound that is present on her left lateral lower leg as well as the left dorsal foot. The foot is very minimal and seems to be doing decently well the leg is a little bit worse. The foot is been present since about March 1 and the leg since about May 1. Subsequently the patient tells me that this is due to a trauma. She does have diabetes. With that being said she also has chronic kidney disease stage IV, congestive heart failure, and hypertension. She tells me that this is actually gotten significantly better since the initial injury. With that  being said there still is a significant issue here that we do need to try to get under control. The patient voiced understanding. Nonetheless I do believe that this is going to require regular wound care here in the clinic  with [place]. Patient's wounds are going also require some sharp debridement to clear away some of the necrotic tissue from both locations and I think this is absolutely appropriate based on what we are seeing today I think it would also help with getting things moving in a much better direction in general. 05/31/2021 upon evaluation today patient actually appears to be doing excellent in regard to her wounds. I am actually very pleased with where things stand today. There is no signs of active infection at this time. No fevers, chills, nausea, vomiting, or diarrhea. 06/08/2021 upon evaluation today patient appears to be doing well with regard to her wound. On the leg especially this is very close to complete resolution though is getting really dry at this point I think we can use some Xeroform to try to see if that would be of benefit. She is in agreement with that plan. In regard to the foot this is showing signs of improvement although it still taking some time or doing the [facility] Blue rope packing. 06/15/2021 upon evaluation today patient appears to be doing well with regard to her wounds. The leg is healed and the foot is showing signs of improvement. There does not appear to be any evidence of active infection at this time. No fevers, chills, nausea, vomiting, or diarrhea. 06/22/2021 upon evaluation today patient appears to be doing well with regard to her foot ulcer. She has been tolerating the dressing changes without complication. Fortunately there does not appear to be any signs of active infection at this time. No fevers, chills, nausea, vomiting, or diarrhea. 07/05/2021 upon evaluation today patient appears to be doing awesome in regard to her foot ulcer. This is pretty much  about completely closed there is may be a small 0.1 opening but nothing significant whatsoever at this time. I am extremely happy with where we stand. 07/12/2021 on evaluation today patient's wound on her foot actually showing signs of complete resolution this has dried up she has not had any drainage over the past week I suspected it was healed last week but we were not 100% sure. I am very pleased to see this is still doing well. Objective Constitutional Well-nourished and well-hydrated in no acute distress. Vitals Time Taken: 10:58 AM, Height: 68 in, Weight: 278 lbs, BMI: 42.3, Temperature: 97.6 F, Pulse: 61 bpm, Respiratory Rate: 18 breaths/min, Blood Pressure: 126/69 mmHg. [name], [name] ([id number]) Respiratory normal breathing without difficulty. Psychiatric this patient is able to make decisions and demonstrates good insight into disease process. Alert and Oriented x 3. pleasant and cooperative. General Notes: Upon evaluation patient's foot actually appears to be completely healed which is great news I see no signs of opening and this is awesome as well. Regular go ahead and discontinue wound care services at this point. Integumentary (Hair, Skin) Wound #10 status is Open. Original cause of wound was Surgical Injury. The date acquired was: 12/05/2020. The wound has been in treatment 7 weeks. The wound is located on the Left,Dorsal Foot. The wound measures 0cm length x 0cm width x 0cm depth; 0cm^2 area and 0cm^3 volume. There is no tunneling or undermining noted. There is a none present amount of drainage noted. There is no granulation within the wound bed. There is no necrotic tissue within the wound bed. Assessment Active Problems ICD-10 Type 2 diabetes mellitus with foot ulcer Non-pressure chronic ulcer of other part of left foot with fat layer exposed Laceration without foreign body, left lower leg, initial encounter Chronic kidney  disease, stage 4 (severe) Chronic combined  systolic (congestive) and diastolic (congestive) heart failure Essential (primary) hypertension Plan Discharge From [facility] Services: Discharge from [place] Treatment Complete - Wear surgical shoe for 2 weeks and then patient able to transition to regular shoe 1. Would recommend currently that we going continue with the wound care measures as before and the patient is in agreement with plan. This includes the use of the protective dressing for the next week to 2 weeks. Also think she needs to watch out for any signs of drainage if anything occurs she should let me know as soon as possible. 2. I am also going to recommend the patient should continue to use the postop shoe for the next 2 weeks after that she will go back to a standard shoe. We will see patient back for reevaluation in 1 week here in the clinic. If anything worsens or changes patient will contact our office for additional recommendations. Electronic Signature(s) Signed: 07/13/2021 8:19:44 AM By: Worthy Keeler PA-C Entered By: Worthy Keeler on 07/13/2021 08:19:44 [name] ([id number]) -------------------------------------------------------------------------------- SuperBill Details Patient Name: [name] Date of Service: 07/12/2021 Medical Record Number: [id number] Patient Account Number: [id number] Date of Birth/Sex: 03/19/1956 (65 y.o. 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 S81.812A Laceration without foreign body, left lower leg, initial encounter N18.4 Chronic kidney disease, stage 4 (severe) I50.42 Chronic combined systolic (congestive) and diastolic (congestive) heart  failure I10 Essential (primary) hypertension Facility Procedures CPT4 Code: [id number] Description: [phone] - WOUND CARE VISIT-LEV 2 EST PT Modifier: Quantity: 1 Physician Procedures CPT4 Code: [id number] Description: 99213 - WC PHYS LEVEL 3 - EST PT Modifier: Quantity: 1 CPT4 Code: Description: ICD-10 Diagnosis Description E11.621 Type 2 diabetes mellitus with foot ulcer L97.522 Non-pressure chronic ulcer of other part of left foot with fat layer ex P80.998P Laceration without foreign body, left lower leg, initial encounter N18.4  Chronic kidney disease, stage 4 (severe) Modifier: posed Quantity: Electronic Signature(s) Signed: 07/13/2021 8:20:29 AM By: Worthy Keeler PA-C Previous Signature: 07/12/2021 1:42:44 PM Version By: Dolan Amen RN Entered By: Worthy Keeler on 07/13/2021 08:20:29

## 2021-07-13 ENCOUNTER — Encounter: Payer: Self-pay | Admitting: Neurology

## 2021-07-23 ENCOUNTER — Encounter: Payer: Self-pay | Admitting: Neurology

## 2021-07-23 ENCOUNTER — Ambulatory Visit (INDEPENDENT_AMBULATORY_CARE_PROVIDER_SITE_OTHER): Payer: HMO | Admitting: Neurology

## 2021-07-23 ENCOUNTER — Other Ambulatory Visit: Payer: Self-pay

## 2021-07-23 VITALS — BP 105/62 | HR 55 | Ht 68.0 in | Wt 273.0 lb

## 2021-07-23 DIAGNOSIS — G610 Guillain-Barre syndrome: Secondary | ICD-10-CM

## 2021-07-23 DIAGNOSIS — E1142 Type 2 diabetes mellitus with diabetic polyneuropathy: Secondary | ICD-10-CM | POA: Diagnosis not present

## 2021-07-23 MED ORDER — GABAPENTIN 600 MG PO TABS: ORAL_TABLET | ORAL | 1 refills | Status: DC

## 2021-07-23 NOTE — Patient Instructions (Addendum)
Reduce gabapentin to 300mg  at bedtime.  If your pain gets worse, you can take 300mg  twice daily  Continue Cymbalta 30mg  daily  Return to clinic in 6 months

## 2021-07-23 NOTE — Progress Notes (Signed)
Follow-up Visit   Date: 07/23/21   [name] MRN: [id number] DOB: Sep 01, 1956   Interim History: [name] is a 65 y.o. right-handed Caucasian female returning to the clinic for follow-up of diabetic polyradiculoneuropathy.  The patient was accompanied to the clinic by self.   Her left foot ulcer has healed and she has been out of her CAM boot for the past two weeks.  There has been no significant change in the degree of numbness and tingling in the legs and feet.  Despite reducing her gabapentin to 300mg  twice daily (due to renal function), she has not noticed any marked change in pain.  She remains on Cymbalta 30mg  daily.    Medications:  Current Outpatient Medications on File Prior to Visit  Medication Sig Dispense Refill   amiodarone (PACERONE) 200 MG tablet TAKE 1 TABLET BY MOUTH ONCE DAILY 90 tablet 1   aspirin EC 81 MG EC tablet Take 1 tablet (81 mg total) by mouth daily. Swallow whole. 30 tablet 11   atorvastatin (LIPITOR) 10 MG tablet Take 1 tablet (10 mg total) by mouth daily. 30 tablet 11   calcitRIOL (ROCALTROL) 0.5 MCG capsule Take 0.5 mcg by mouth every morning.     Calcium Carb-Cholecalciferol (CALCIUM CARBONATE-VITAMIN D3) 600-400 MG-UNIT TABS Take 1 tablet by mouth 2 (two) times a day.     carvedilol (COREG) 3.125 MG tablet Take 3.125 mg by mouth every morning.     clopidogrel (PLAVIX) 75 MG tablet TAKE 1 TABLET BY MOUTH ONCE DAILY 90 tablet 1   DULoxetine (CYMBALTA) 30 MG capsule Take 1 capsule (30 mg total) by mouth at bedtime. 90 capsule 3   ferrous sulfate 325 (65 FE) MG tablet Take 325 mg by mouth 2 (two) times daily with a meal.     gabapentin (NEURONTIN) 600 MG tablet 1 tablet in the morning and 2 tablets at bedtime. 270 tablet 1   insulin aspart (NOVOLOG FLEXPEN) 100 UNIT/ML FlexPen Inject 5-10 Units into the skin 3 (three) times daily before meals. Sugar 100-149 - 5 units.  150-199- 6 units.  200-249- 7 units.  250-299- 8 units. 300-349- 9  units.  350+ 10 units. 15 mL    Insulin Pen Needle (PEN NEEDLES) 32G X 6 MM MISC 1 each by Does not apply route 4 (four) times daily. 150 each 11   isosorbide mononitrate (IMDUR) 30 MG 24 hr tablet TAKE 1 TABLET BY MOUTH ONCE DAILY 90 tablet 3   LANTUS SOLOSTAR 100 UNIT/ML Solostar Pen INJECT 22 UNITS UNDER THE SKIN DAILY. (Patient taking differently: Inject 22 Units into the skin at bedtime.) 15 mL 5   levothyroxine (SYNTHROID) 50 MCG tablet Take 1 tablet (50 mcg total) by mouth daily before breakfast. 90 tablet 1   losartan (COZAAR) 25 MG tablet TAKE 1/2 TABLET BY MOUTH DAILY 45 tablet 2   Multiple Vitamin (MULTIVITAMIN WITH MINERALS) TABS tablet Take 1 tablet by mouth every morning.     nitroGLYCERIN (NITROSTAT) 0.4 MG SL tablet Place 1 tablet (0.4 mg total) under the tongue every 5 (five) minutes as needed for chest pain. 25 tablet 3   Omega-3 Fatty Acids (FISH OIL) 1000 MG CAPS Take 1,000 mg by mouth 2 (two) times a day.     ONETOUCH ULTRA test strip UUD TO CHECK BLOOD SUGAR 3 TIMES DAILY 300 each 2   torsemide (DEMADEX) 20 MG tablet Take 1 tablet (20 mg total) by mouth daily. 30 tablet 3   traMADol (ULTRAM)  50 MG tablet TAKE 1 TO 2 TABLET BY MOUTH AT BEDTIME AS NEEDED 60 tablet 2   No current facility-administered medications on file prior to visit.    Allergies:  Allergies  Allergen Reactions   Heparin     Can not remember reaction   Nsaids     Kidney disease    Vital Signs:  There were no vitals taken for this visit.  Neurological Exam: MENTAL STATUS including orientation to time, place, person, recent and remote memory, attention span and concentration, language, and fund of knowledge is normal.  Speech is not dysarthric.  MOTOR:  Motor strength is 5/5 in the RUE and RLE,  LUE with 4/5 finger extensors (old), and 2/5 left dorsoflexion, proximal strength is 5/5.  No pronator drift.  Tone is normal.    SENSORY:  Vibration absent at the ankles, intact at the knees and  MCP.  COORDINATION/GAIT:  Gait not tested, she has her walker today  Data: Lab Results  Component Value Date   CREATININE 1.56 (H) 05/21/2021   BUN 30 (H) 05/21/2021   NA 141 05/21/2021   K 5.2 (H) 05/21/2021   CL 106 05/21/2021   CO2 28 05/21/2021   Lab Results  Component Value Date   HGBA1C 6.8 (H) 04/23/2021     IMPRESSION/PLAN: Diabetic polyradiculonueropathy with left foot drop and lower leg/feet paresthesias.  HbA1c well controlled 6.8 - Reduce gabapentin to 300mg  at bedtime (try to get her on the lowest therapeutic dose due to renal function) - Continue Cymbalta 30mg  daily  Return to clinic in 6 months   Thank you for allowing me to participate in patient's care.  If I can answer any additional questions, I would be pleased to do so.    Sincerely,    Banyan Goodchild K. Allena Katz, DO

## 2021-07-25 DIAGNOSIS — M170 Bilateral primary osteoarthritis of knee: Secondary | ICD-10-CM | POA: Insufficient documentation

## 2021-07-25 NOTE — Telephone Encounter (Signed)
Scheduled f/u for 07/30/21 at 2:30 pm via telephone. Patient can not come to [place] and does not have a way to do VV.

## 2021-07-30 ENCOUNTER — Ambulatory Visit: Payer: HMO | Admitting: Family Medicine

## 2021-07-30 ENCOUNTER — Ambulatory Visit: Payer: HMO | Admitting: Neurology

## 2021-08-04 DIAGNOSIS — M25561 Pain in right knee: Secondary | ICD-10-CM | POA: Diagnosis not present

## 2021-08-06 DIAGNOSIS — H35033 Hypertensive retinopathy, bilateral: Secondary | ICD-10-CM | POA: Diagnosis not present

## 2021-08-06 DIAGNOSIS — Z961 Presence of intraocular lens: Secondary | ICD-10-CM | POA: Diagnosis not present

## 2021-08-06 DIAGNOSIS — H43813 Vitreous degeneration, bilateral: Secondary | ICD-10-CM | POA: Diagnosis not present

## 2021-08-06 DIAGNOSIS — H35372 Puckering of macula, left eye: Secondary | ICD-10-CM | POA: Diagnosis not present

## 2021-08-06 DIAGNOSIS — E113292 Type 2 diabetes mellitus with mild nonproliferative diabetic retinopathy without macular edema, left eye: Secondary | ICD-10-CM | POA: Diagnosis not present

## 2021-08-08 DIAGNOSIS — M1711 Unilateral primary osteoarthritis, right knee: Secondary | ICD-10-CM | POA: Diagnosis not present

## 2021-08-09 DIAGNOSIS — M86171 Other acute osteomyelitis, right ankle and foot: Secondary | ICD-10-CM | POA: Diagnosis not present

## 2021-08-09 DIAGNOSIS — G4733 Obstructive sleep apnea (adult) (pediatric): Secondary | ICD-10-CM | POA: Diagnosis not present

## 2021-08-09 DIAGNOSIS — J189 Pneumonia, unspecified organism: Secondary | ICD-10-CM | POA: Diagnosis not present

## 2021-08-09 DIAGNOSIS — I5022 Chronic systolic (congestive) heart failure: Secondary | ICD-10-CM | POA: Diagnosis not present

## 2021-09-04 ENCOUNTER — Other Ambulatory Visit: Payer: Self-pay | Admitting: Family Medicine

## 2021-09-08 DIAGNOSIS — J189 Pneumonia, unspecified organism: Secondary | ICD-10-CM | POA: Diagnosis not present

## 2021-09-08 DIAGNOSIS — M86171 Other acute osteomyelitis, right ankle and foot: Secondary | ICD-10-CM | POA: Diagnosis not present

## 2021-09-08 DIAGNOSIS — I5022 Chronic systolic (congestive) heart failure: Secondary | ICD-10-CM | POA: Diagnosis not present

## 2021-09-08 DIAGNOSIS — G4733 Obstructive sleep apnea (adult) (pediatric): Secondary | ICD-10-CM | POA: Diagnosis not present

## 2021-09-13 ENCOUNTER — Other Ambulatory Visit: Payer: Self-pay | Admitting: Cardiovascular Disease

## 2021-09-13 ENCOUNTER — Other Ambulatory Visit: Payer: Self-pay | Admitting: Family Medicine

## 2021-09-13 NOTE — Telephone Encounter (Signed)
Please schedule overdue F/U appointment with Dr. Rockey Situ. Thank you!

## 2021-09-13 NOTE — Telephone Encounter (Signed)
Please contact pt overdue for 2 month f/u. Last seen 10/2019. Pt needing refills.

## 2021-09-13 NOTE — Telephone Encounter (Signed)
No VM  

## 2021-09-14 NOTE — Telephone Encounter (Signed)
Refill request for tramadol 50 mg tabs  LOV - 02/15/21 Next OV - not scheduled Last refill - 06/06/21 #60/2

## 2021-09-16 NOTE — Telephone Encounter (Signed)
Needs f/u scheduled re: DM.  We can do labs at the visit.  Rx sent. Thanks.

## 2021-09-17 ENCOUNTER — Encounter: Payer: Self-pay | Admitting: Family Medicine

## 2021-09-17 NOTE — Telephone Encounter (Signed)
Sent mychart letter.

## 2021-09-17 NOTE — Telephone Encounter (Signed)
Called patient vm is not set up and her home phone is disconnected

## 2021-09-21 ENCOUNTER — Telehealth: Payer: Self-pay

## 2021-09-21 NOTE — Progress Notes (Signed)
Chronic Care Management Pharmacy Assistant   Name: KEYONTA MADRID  MRN: [id number] DOB: 1955-11-18  Reason for Encounter: CCM (Appointment Reminder)   Medications: Outpatient Encounter Medications as of 09/21/2021  Medication Sig   amiodarone (PACERONE) 200 MG tablet TAKE 1 TABLET BY MOUTH ONCE DAILY   aspirin EC 81 MG EC tablet Take 1 tablet (81 mg total) by mouth daily. Swallow whole.   atorvastatin (LIPITOR) 10 MG tablet Take 1 tablet (10 mg total) by mouth daily.   calcitRIOL (ROCALTROL) 0.5 MCG capsule Take 0.5 mcg by mouth every morning.   Calcium Carb-Cholecalciferol (CALCIUM CARBONATE-VITAMIN D3) 600-400 MG-UNIT TABS Take 1 tablet by mouth 2 (two) times a day.   carvedilol (COREG) 3.125 MG tablet Take 3.125 mg by mouth every morning.   clopidogrel (PLAVIX) 75 MG tablet TAKE 1 TABLET BY MOUTH ONCE DAILY   DULoxetine (CYMBALTA) 30 MG capsule Take 1 capsule (30 mg total) by mouth at bedtime.   ferrous sulfate 325 (65 FE) MG tablet Take 325 mg by mouth 2 (two) times daily with a meal.   gabapentin (NEURONTIN) 600 MG tablet Take 300mg  at bedtime.   Insulin Aspart FlexPen (NOVOLOG) 100 UNIT/ML INJECT 0.05 ML (5 UNITS TOTAL) INTO THE SKIN 3 TIMES DAILY BEFORE MEALS   Insulin Pen Needle (PEN NEEDLES) 32G X 6 MM MISC 1 each by Does not apply route 4 (four) times daily.   isosorbide mononitrate (IMDUR) 30 MG 24 hr tablet TAKE 1 TABLET BY MOUTH ONCE DAILY   LANTUS SOLOSTAR 100 UNIT/ML Solostar Pen INJECT 22 UNITS UNDER THE SKIN DAILY. (Patient taking differently: Inject 22 Units into the skin at bedtime.)   levothyroxine (SYNTHROID) 50 MCG tablet Take 1 tablet (50 mcg total) by mouth daily before breakfast.   losartan (COZAAR) 25 MG tablet TAKE 1/2 TABLET BY MOUTH DAILY   Multiple Vitamin (MULTIVITAMIN WITH MINERALS) TABS tablet Take 1 tablet by mouth every morning.   nitroGLYCERIN (NITROSTAT) 0.4 MG SL tablet Place 1 tablet (0.4 mg total) under the tongue every 5 (five) minutes as  needed for chest pain.   Omega-3 Fatty Acids (FISH OIL) 1000 MG CAPS Take 1,000 mg by mouth 2 (two) times a day.   ONETOUCH ULTRA test strip UUD TO CHECK BLOOD SUGAR 3 TIMES DAILY   torsemide (DEMADEX) 20 MG tablet Take 1 tablet (20 mg total) by mouth daily.   traMADol (ULTRAM) 50 MG tablet TAKE 1 TO 2 TABLET BY MOUTH AT BEDTIME AS NEEDED   No facility-administered encounter medications on file as of 09/21/2021.   Avina Eberle July was contacted to remind her of her upcoming telephone visit with Charlene Brooke on 09/26/2021 at 1:00. Patient was reminded to have all medications, supplements and any blood glucose and blood pressure readings available for review at appointment. Patient was asked to record her blood pressure and blood sugar daily starting today until the call with Charlene Brooke. I advised patient that Mendel Ryder would ask for her readings. Patient understood and agreed.   Are you having any problems with your medications? No  Do you have any concerns you like to discuss with the pharmacist? Yes - Patient is having a hard time keeping her blood sugar in range. Patient stated she is under a lot of stress right now and feels this could be contributing to her numbers.   Star Rating Drugs: Medication:   Last Fill: Day Supply Insulin Aspart 100 unit 09/04/2021 100 Losartan 25 mg  09/07/2021 90 Atorvastatin 10 mg  09/07/2021 30 Lantus 100 Unit  08/04/2021 [place], CPP notified  Marijean Niemann, [place] [phone]  Time Spent:  10 Minutes

## 2021-09-24 ENCOUNTER — Other Ambulatory Visit: Payer: Self-pay

## 2021-09-24 ENCOUNTER — Ambulatory Visit (INDEPENDENT_AMBULATORY_CARE_PROVIDER_SITE_OTHER): Payer: HMO | Admitting: Family Medicine

## 2021-09-24 VITALS — BP 147/80 | HR 65 | Ht 68.0 in | Wt 280.0 lb

## 2021-09-24 DIAGNOSIS — E1142 Type 2 diabetes mellitus with diabetic polyneuropathy: Secondary | ICD-10-CM | POA: Diagnosis not present

## 2021-09-24 NOTE — Progress Notes (Signed)
Interactive audio and video telecommunications were attempted between this provider and patient, however failed, due to patient having technical difficulties OR patient did not have access to video capability.  We continued and completed visit with audio only.   Virtual Visit via Telephone Note  I connected with patient on 09/24/21  at 2:47 PM  by telephone and verified that I am speaking with the correct person using two identifiers.  Location of patient: home   Location of MD: [place] Name of referring provider (if blank then none associated): Names per persons and role in encounter:  MD: Earlyne Iba, Patient: name listed above.    I discussed the limitations, risks, security and privacy concerns of performing an evaluation and management service by telephone and the availability of in person appointments. I also discussed with the patient that there may be a patient responsible charge related to this service. The patient expressed understanding and agreed to proceed.  CC: DM2 f/u.    History of Present Illness:   Taking sliding scale with meals 2 times a day along with 22 units of lantus.    Sugar usually 300 at night, just prior to bed.  By the AM is it down as low as 130s, but some AM still with sugars in the 190s.  We talked about checking pre and post meal sugars.  She can call back with that info.    Her foot healed and she is seeing ortho about a knee brace.  Flu shot to be done when possible, discussed.     Observations/Objective: Nad Speech wnl    Assessment and Plan:  DM2.   We talked about checking pre and post meal sugars.  She can call back with that info.  She also needs labs set up at [facility] station.  I will check on getting that set up.  I will see if she can qualify for a freestyle meter.  In the meantime would continue with her Lantus and sliding scale insulin as is.  I will await update from patient and we will go from there.  Follow Up Instructions:  see above.    I discussed the assessment and treatment plan with the patient. The patient was provided an opportunity to ask questions and all were answered. The patient agreed with the plan and demonstrated an understanding of the instructions.   The patient was advised to call back or seek an in-person evaluation if the symptoms worsen or if the condition fails to improve as anticipated.  I provided 18 minutes of non-face-to-face time during this encounter.  Elsie Stain, MD

## 2021-09-26 ENCOUNTER — Ambulatory Visit (INDEPENDENT_AMBULATORY_CARE_PROVIDER_SITE_OTHER): Payer: HMO | Admitting: Pharmacist

## 2021-09-26 ENCOUNTER — Encounter: Payer: Self-pay | Admitting: Internal Medicine

## 2021-09-26 ENCOUNTER — Telehealth: Payer: Self-pay | Admitting: Pharmacist

## 2021-09-26 ENCOUNTER — Other Ambulatory Visit: Payer: Self-pay

## 2021-09-26 DIAGNOSIS — E1142 Type 2 diabetes mellitus with diabetic polyneuropathy: Secondary | ICD-10-CM

## 2021-09-26 DIAGNOSIS — I2510 Atherosclerotic heart disease of native coronary artery without angina pectoris: Secondary | ICD-10-CM

## 2021-09-26 DIAGNOSIS — E7800 Pure hypercholesterolemia, unspecified: Secondary | ICD-10-CM

## 2021-09-26 DIAGNOSIS — I471 Supraventricular tachycardia: Secondary | ICD-10-CM

## 2021-09-26 DIAGNOSIS — N184 Chronic kidney disease, stage 4 (severe): Secondary | ICD-10-CM

## 2021-09-26 DIAGNOSIS — I5022 Chronic systolic (congestive) heart failure: Secondary | ICD-10-CM

## 2021-09-26 DIAGNOSIS — I10 Essential (primary) hypertension: Secondary | ICD-10-CM

## 2021-09-26 NOTE — Progress Notes (Signed)
Chronic Care Management Pharmacy Note  09/26/2021 Name:  [name] MRN:  [id number] DOB:  04-10-1956  Summary: -Pt is interested in CGM and her insurance plan covers Freestyle Libre at her pharmacy of choice  Recommendations/Changes made from today's visit: -Floyd 2 (sensors + reader); pt will call once she has device for help with set up/first application  Plan -Pharmacist follow up televisit scheduled for 2 weeks   Subjective: [name] is an 65 y.o. year old female who is a primary patient of Damita Dunnings, Elveria Rising, MD.  The CCM team was consulted for assistance with disease management and care coordination needs.    Engaged with patient by telephone for follow up visit in response to provider referral for pharmacy case management and/or care coordination services.   Consent to Services:  The patient was given information about Chronic Care Management services, agreed to services, and gave verbal consent prior to initiation of services.  Please see initial visit note for detailed documentation.   Patient Care Team: [name], MD as PCP - General (Family Medicine) Minna Merritts, MD as PCP - Cardiology (Cardiology) Deboraha Sprang, MD as PCP - Electrophysiology (Cardiology) Bensimhon, Shaune Pascal, MD as PCP - Advanced Heart Failure (Cardiology) Calvert Cantor, MD as Consulting Physician (Ophthalmology) Lavonia Dana, MD as Consulting Physician (Internal Medicine) Elvina Mattes, Adele Schilder (Inactive) as Attending Physician (Podiatry) Tsosie Billing, MD as Consulting Physician (Infectious Diseases) Alda Berthold, DO as Consulting Physician (Neurology) [name], [facility] as Pharmacist (Pharmacist)   Patient has been living with her mother since her toe amputation so she can help with dressing changes.   Recent office visits: 09/24/21 Dr Damita Dunnings VV (telephone): advised chekcing pre and post-meal sugars. Rec CGM. Labs ordered  (CMP, CBC, A1C, lipids)  02/15/21 Damita Dunnings (PCP) [facility] f/u. Osteomyelitis. Reduce Gabapentin 600 mg HS & start Novolog sliding scale. Advised to restart torsemide 20 mg daily PRN leg swelling. Recheck BMP/K in 1 week.  Recent consult visits: 07/23/21 Dr Posey Pronto (neurology): f/u neuropathy; reduce gabapentin to 300 mg HS due to renal function;   Following with podiatry (Dr Jacqualyn Posey) and wound care for L great toe amputation 05/31/21 Stone,PA (Wound Care) - NP Left LE and Left Foot Ulcer. Apply Hydrofera Blue Ready to wound. Weekly treatments.  05/21/21 NP Allena Katz (HF clinic): restart torsemide 20 mg daily. Hold off on SGLT2i until renal function results. K 5.2, avoid K-rich foods and repeat BMP 7 days.  04/20/21 Manandhar (Infectious Disease) - Acute osteomyelitis. Central line removal. F/u podiatry. F/u ID PRN.  04/02/21 12/25/20 Kolluru (Nephrology) - Chronic kidney disease, stage 4. F/u 3 mos.   03/30/21 Manandhar (Infectious Disease) - Acute osteomyelitis. Start Daptomycin for 6 wks.   03/25/21 Caryl Comes (Cardiology) - Nonischemic cardiomyopathy. EKG 12-lead. No med changes.  02/16/21 Patel (Podiatry) - Hematoma of left lower leg. Start Amoxicillin 875-125 mg.   01/26/21 Patel (Podiatry) - Diabetic peripheral neuropathy associated with type 2 diabetes mellitus. D/c Cephalexin & Clindamycin.   01/22/21 Evans (Podiatry) - f/u foot ulcer. Start GARAMYCIN 0.1%.   12/25/20 Kolluru (Nephrology) - F/u Chronic kidney disease, Stage IV. Start Cephalexin 545m & Empagliflozin 10 mg.D/c Cacitriol.  F/u 3 mos.  Hospital visits: Admitted 5/22 for cellulitis. She underwent amputation of left great toe 5/27. Repeat arterial studies 5/27 showed a 50-59% stenosis on the distal left popliteal stent segment, no new interventions per vascular. ID was consulted and recommended 6 weeks of abx. Tunneled central  line placed (PICC avoided due to CKD) and she was discharged on Daptomycin  Admitted 4/22 for  osteomyelitis of left foot after failing outpatient management. Had initial surgery (I&D, bone biopsy, and sesamoidectomy). Vascular was consulted after it was felt she was high risk for limb loss due to dampened monophasic signals in left foot on ABI. She underwent CO2 angiogram with angioplasty of 3 separate in-stent stenoses in the left superficial femoral artery, and then revision of surgery (bone resection of 5th metatarsal head and base of the prox phalanx of the 5th). Torsemide held at discharge due to worsening renal function.  Objective:  Lab Results  Component Value Date   CREATININE 1.56 (H) 05/21/2021   BUN 30 (H) 05/21/2021   GFR 26.41 (L) 02/15/2021   GFRNONAA 36 (L) 05/21/2021   GFRAA 23 (L) 11/05/2019   NA 141 05/21/2021   K 5.2 (H) 05/21/2021   CALCIUM 9.0 05/21/2021   CO2 28 05/21/2021   GLUCOSE 160 (H) 05/21/2021    Lab Results  Component Value Date/Time   HGBA1C 6.8 (H) 04/23/2021 12:23 PM   HGBA1C 8.1 (H) 01/28/2021 11:10 PM   FRUCTOSAMINE 305 (H) 12/24/2018 05:53 PM   GFR 26.41 (L) 02/15/2021 03:29 PM   GFR 15.54 (L) 01/24/2021 09:53 AM   MICROALBUR 6.9 08/06/2016 02:32 PM   MICROALBUR 10.0 12/12/2015 03:48 PM    Last diabetic Eye exam: No results found for: HMDIABEYEEXA  Last diabetic Foot exam: No results found for: HMDIABFOOTEX   Lab Results  Component Value Date   CHOL 128 09/26/2020   HDL 44 09/26/2020   LDLCALC 60 09/26/2020   TRIG 134 09/26/2020   CHOLHDL 2.9 09/26/2020    Hepatic Function Latest Ref Rng & Units 03/03/2021 02/26/2021 02/15/2021  Total Protein 6.5 - 8.1 g/dL 6.1(L) 7.2 7.2  Albumin 3.5 - 5.0 g/dL 3.0(L) 3.5 3.7  AST 15 - 41 U/L _0 ALT 0 - 44 U/L _1 Alk Phosphatase 38 - 126 U/L 55 64 67  Total Bilirubin 0.3 - 1.2 mg/dL 0.4 0.7 0.4  Bilirubin, Direct 0.00 - 0.40 mg/dL - - -    Lab Results  Component Value Date/Time   TSH 1.160 03/27/2021 12:47 PM   TSH 1.400 09/26/2020 12:16 PM   FREET4 1.19 03/22/2019 11:59  AM   FREET4 1.1 04/16/2016 04:50 PM    CBC Latest Ref Rng & Units 04/25/2021 03/07/2021 03/05/2021  WBC 4.0 - 10.5 K/uL 7.5 7.7 6.8  Hemoglobin 12.0 - 15.0 g/dL 12.1 10.8(L) 9.2(L)  Hematocrit 36.0 - 46.0 % 38.1 32.6(L) 28.2(L)  Platelets 150 - 400 K/uL 185 180 141(L)    Lab Results  Component Value Date/Time   VD25OH 32 06/01/2012 10:00 AM   CHA2DS2-VASc Score = 8  The patient's score is based upon: CHF History: 1 HTN History: 1 Diabetes History: 1 Stroke History: 2 Vascular Disease History: 1 Age Score: 1 Gender Score: 1        Clinical ASCVD: Yes  The ASCVD Risk score (Arnett DK, et al., 2019) failed to calculate for the following reasons:   The patient has a prior MI or stroke diagnosis    Depression screen [facility] 2/9 03/30/2021 03/21/2021 09/28/2020  Decreased Interest 0 0 0  Down, Depressed, Hopeless - 0 1  PHQ - 2 Score 0 0 1  Altered sleeping - 0 0  Tired, decreased energy - 0 3  Change in appetite - 0 3  Feeling bad or failure about  yourself  - 0 0  Trouble concentrating - 0 0  Moving slowly or fidgety/restless - 0 0  Suicidal thoughts - 0 0  PHQ-9 Score - 0 7  Difficult doing work/chores - Not difficult at all Not difficult at all  Some recent data might be hidden    Social History   Tobacco Use  Smoking Status Never  Smokeless Tobacco Never   BP Readings from Last 3 Encounters:  09/24/21 (!) 147/80  07/23/21 105/62  05/21/21 139/77   Pulse Readings from Last 3 Encounters:  09/24/21 65  07/23/21 (!) 55  05/21/21 67   Wt Readings from Last 3 Encounters:  09/24/21 280 lb (127 kg)  07/23/21 273 lb (123.8 kg)  05/21/21 277 lb 9.6 oz (125.9 kg)   BMI Readings from Last 3 Encounters:  09/24/21 42.57 kg/m  07/23/21 41.51 kg/m  05/21/21 42.21 kg/m    Assessment/Interventions: Review of patient past medical history, allergies, medications, health status, including review of consultants reports, laboratory and other test data, was performed as part  of comprehensive evaluation and provision of chronic care management services.   SDOH:  (Social Determinants of Health) assessments and interventions performed: Yes  SDOH Screenings   Alcohol Screen: Low Risk    Last Alcohol Screening Score (AUDIT): 0  Depression (PHQ2-9): Low Risk    PHQ-2 Score: 0  Financial Resource Strain: Low Risk    Difficulty of Paying Living Expenses: Not hard at all  Food Insecurity: No Food Insecurity   Worried About Charity fundraiser in the Last Year: Never true   Ran Out of Food in the Last Year: Never true  Housing: Low Risk    Last Housing Risk Score: 0  Physical Activity: Inactive   Days of Exercise per Week: 0 days   Minutes of Exercise per Session: 0 min  Social Connections: Not on file  Stress: No Stress Concern Present   Feeling of Stress : Not at all  Tobacco Use: Low Risk    Smoking Tobacco Use: Never   Smokeless Tobacco Use: Never   Passive Exposure: Not on file  Transportation Needs: No Transportation Needs   Lack of Transportation (Medical): No   Lack of Transportation (Non-Medical): No    CCM Care Plan  Allergies  Allergen Reactions   Heparin     Can not remember reaction   Nsaids     Kidney disease    Medications Reviewed Today     Reviewed by Filbert Berthold, CMA (Certified Medical Assistant) on 09/24/21 at 1439  Med List Status: <None>   Medication Order Taking? Sig Documenting Provider Last Dose Status Informant  amiodarone (PACERONE) 200 MG tablet [id number] Yes TAKE 1 TABLET BY MOUTH ONCE DAILY Bensimhon, Shaune Pascal, MD Taking Active   aspirin EC 81 MG EC tablet [id number] Yes Take 1 tablet (81 mg total) by mouth daily. Swallow whole. Nita Sells, MD Taking Active   atorvastatin (LIPITOR) 10 MG tablet [id number] Yes Take 1 tablet (10 mg total) by mouth daily. Geradine Girt, DO Taking Active Self  calcitRIOL (ROCALTROL) 0.5 MCG capsule [id number] Yes Take 0.5 mcg by mouth every morning. [provider]  Taking Active Self  Calcium Carb-Cholecalciferol (CALCIUM CARBONATE-VITAMIN D3) 600-400 MG-UNIT TABS [id number] Yes Take 1 tablet by mouth 2 (two) times a day. [provider] Taking Active Self  carvedilol (COREG) 3.125 MG tablet [id number] Yes Take 3.125 mg by mouth every morning. [provider] Taking Active Self  clopidogrel (PLAVIX) 75  MG tablet [id number] Yes Take 1 tablet (75 mg total) by mouth daily. NO FURTHER REFILLS UNTIL SEEN IN CLINIC, PLEASE CALL OFFICE TO SCHEDULE AN APPOINTMENT. Minna Merritts, MD Taking Active   DULoxetine (CYMBALTA) 30 MG capsule [id number] Yes Take 1 capsule (30 mg total) by mouth at bedtime. Alda Berthold, DO Taking Active Self  ferrous sulfate 325 (65 FE) MG tablet [id number] Yes Take 325 mg by mouth 2 (two) times daily with a meal. [provider] Taking Active Self  gabapentin (NEURONTIN) 600 MG tablet [id number] Yes Take 325m at bedtime. PNarda AmberK, DO Taking Active   Insulin Aspart FlexPen (NOVOLOG) 100 UNIT/ML [id number] INJECT 0.05 ML (5 UNITS TOTAL) INTO THE SKIN 3 TIMES DAILY BEFORE MEALS DTonia Ghent MD Taking Active   Insulin Pen Needle (PEN NEEDLES) 32G X 6 MM MISC [id number] 1 each by Does not apply route 4 (four) times daily. SJacelyn Pi ILilia Argue MD Taking Active Self  isosorbide mononitrate (IMDUR) 30 MG 24 hr tablet [id number] TAKE 1 TABLET BY MOUTH ONCE DAILY Bensimhon, DShaune Pascal MD Taking Active   LANTUS SOLOSTAR 100 UNIT/ML Solostar Pen [id number] INJECT 22 UNITS UNDER THE SKIN DAILY.  Patient taking differently: Inject 22 Units into the skin at bedtime.   DTonia Ghent MD Taking Active            Med Note (Lenord Fellers LCleaster Corin  Fri Jun 29, 2021 10:14 AM)    levothyroxine (SYNTHROID) 50 MCG tablet [id number] Take 1 tablet (50 mcg total) by mouth daily before breakfast. DTonia Ghent MD Taking Active   losartan (COZAAR) 25 MG tablet [id number] TAKE 1/2 TABLET BY MOUTH DAILY  Bensimhon, DShaune Pascal MD Taking Active   Multiple Vitamin (MULTIVITAMIN WITH MINERALS) TABS tablet [id number] Take 1 tablet by mouth every morning. [provider] Taking Active Self  nitroGLYCERIN (NITROSTAT) 0.4 MG SL tablet [id number] Place 1 tablet (0.4 mg total) under the tongue every 5 (five) minutes as needed for chest pain. KDeboraha Sprang MD Taking Active Self  Omega-3 Fatty Acids (FISH OIL) 1000 MG CAPS [id number] Take 1,000 mg by mouth 2 (two) times a day. [provider] Taking Active Self  ODonald Sivatest strip [id number] UUD TO CHECK BLOOD SUGAR 3 TIMES DAILY DTonia Ghent MD Taking Active Self  torsemide (DEMADEX) 20 MG tablet [id number] Take 1 tablet (20 mg total) by mouth daily. [place] F[place] CarolinaTaking Active   traMADol (ULTRAM) 50 MG tablet [id number] TAKE 1 TO 2 TABLET BY MOUTH AT BEDTIME AS NEEDED DTonia Ghent MD Taking Active             Patient Active Problem List   Diagnosis Date Noted   Localized osteoarthritis of knees, bilateral 07/25/2021   Medication monitoring encounter 03/31/2021   Cellulitis of left foot 02/27/2021   Cellulitis 02/27/2021   Osteomyelitis ([place] 02/18/2021   CKD (chronic kidney disease) stage 4, GFR 15-29 ml/min ([place] 01/28/2021   AICD (automatic cardioverter/defibrillator) present 01/28/2021   Diabetic foot ulcer ([place] 11/24/2020   Advance care planning 10/04/2020   Secondary hyperparathyroidism of renal origin ([place] 07/20/2019   Hyperkalemia 07/20/2019   Benign hypertensive kidney disease with chronic kidney disease 07/20/2019   Anemia in chronic kidney disease 07/20/2019   Positive antinuclear antibody 07/20/2019   Proteinuria 07/20/2019   Acute on chronic systolic (congestive) heart failure ([place] 03/23/2019   UTI (urinary tract  infection) 03/09/2019   CAD (coronary artery disease) 03/09/2019   DM type 2, uncontrolled, with renal complications 09/47/0962   Atrial tachycardia  ([place])    Acute kidney injury superimposed on chronic kidney disease ([place])    AKI (acute kidney injury) ([place])    Weakness generalized 12/22/2018   Shortness of breath 12/22/2018   Palliative care encounter 12/22/2018   Endocarditis 12/14/2018   NSTEMI (non-ST elevated myocardial infarction) ([place]) 11/22/2018   Acute on chronic combined systolic and diastolic CHF (congestive heart failure) ([place]) 10/28/2018   Lymphedema 10/28/2018   Severe sepsis ([place]) 09/28/2018   History of amputation of lesser toe of right foot ([place]) 04/19/2018   Presence of cardiac resynchronization therapy defibrillator (CRT-D) 04/19/2018   Paraparesis of both lower limbs ([place]) 03/29/2018   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   Paroxysmal atrial fibrillation ([place]) 03/29/2018   Class 3 obesity due to excess calories with serious comorbidity and body mass index (BMI) of 40.0 to 44.9 in adult 09/02/2016   History of CVA (cerebrovascular accident) 11/11/2015   Chronic renal insufficiency 08/04/2015   OSA (obstructive sleep apnea) 08/15/2014   Morbid obesity ([place]) 10/26/2013   Essential hypertension, benign 04/29/2013   Pure hypercholesterolemia 12/07/2012   Iron deficiency anemia 12/07/2012   Diabetic peripheral neuropathy associated with type 2 diabetes mellitus ([place]) 83/66/2947   Chronic systolic congestive heart failure ([place]) 07/29/2012   Left bundle-branch block 07/25/2012   Coronary artery disease involving native coronary artery of native heart without angina pectoris 07/25/2012   Heart failure ([place]) 07/21/2012   Hyperlipidemia 07/21/2012    Immunization History  Administered Date(s) Administered   Hepatitis B 03/16/2013   Hepatitis B, adult 03/30/2014, 07/18/2014   Influenza Split 07/21/2012, 07/03/2020   Influenza, High Dose Seasonal PF 07/01/2019   Influenza,inj,Quad PF,6+ Mos 06/16/2013, 07/07/2014, 06/18/2015, 08/06/2016, 07/20/2018   Influenza-Unspecified 07/23/2017, 07/22/2018, 06/11/2019    PFIZER Comirnaty(Gray Top)Covid-19 Tri-Sucrose Vaccine 06/21/2021   PFIZER(Purple Top)SARS-COV-2 Vaccination 11/21/2019, 12/21/2019, 07/19/2020   PNEUMOCOCCAL CONJUGATE-20 06/21/2021   Pneumococcal Polysaccharide-23 08/08/2011, 06/18/2015, 08/13/2016   Tdap 03/07/2010, 05/03/2016   Zoster, Live 04/06/2014    Conditions to be addressed/monitored:  Hypertension, Hyperlipidemia, Diabetes, Atrial Fibrillation, Heart Failure, Coronary Artery Disease, and Chronic Kidney Disease  There are no care plans that you recently modified to display for this patient.      Medication Assistance: None required.  Patient affirms current coverage meets needs.  Compliance/Adherence/Medication fill history: Care Gaps: DEXA Eye exam (04/09/19) Foot exam (07/21/19)  Star-Rating Drugs: Atorvastatin - LF 09/07/21 x 30 ds; PDC 63% Losartan - LF 09/07/21 x 90 ds; [place] 100%  Patient's preferred pharmacy is:  [facility] [place], [place] [address] Phone: [phone] Fax: [phone]  [place], [place] [address] Phone: [phone] Fax: [phone]  [facility] [address] [address] Phone: [phone] Fax: [phone]   Uses pill box? Yes Pt endorses 100% compliance  We discussed: Benefits of medication synchronization, packaging and delivery as well as enhanced pharmacist oversight with [employer]. Patient decided to: Continue current medication management strategy  Care Plan and Follow Up Patient Decision:  Patient agrees to Care Plan and Follow-up.  Plan: Telephone follow up appointment with care management team member scheduled for:  3 months  Charlene Brooke, PharmD, [facility] Clinical Pharmacist [facility] [phone]

## 2021-09-26 NOTE — Telephone Encounter (Signed)
Spoke with patient, she is very interested in using [employer] to keep track of sugars. Her insurance plan will cover this at her pharmacy of choice, so it can be prescribed to [facility] for pick up.  Briefly counseled patient on how to use device, she will call me back once she has the device to walk through how to set it up and apply first sensor.  Recommended orders: Freestyle Libre 2 sensors #2 per month Freestyle Libre 2 reader #1 device

## 2021-09-26 NOTE — Patient Instructions (Signed)
Visit Information  Phone number for Pharmacist: [phone]   Goals Addressed             This Visit's Progress    Manage My Medicine       Timeframe:  Long-Range Goal Priority:  High Start Date:     06/29/21                        Expected End Date:    06/29/22                   Follow Up Date Jan 2023   - call for medicine refill 2 or 3 days before it runs out - call if I am sick and can't take my medicine - keep a list of all the medicines I take; vitamins and herbals too - use a pillbox to sort medicine    Why is this important?   These steps will help you keep on track with your medicines.   Notes:      Monitor and Manage My Blood Sugar-Diabetes Type 2       Timeframe:  Long-Range Goal Priority:  High Start Date:      09/26/21                       Expected End Date:    09/26/22                   Follow Up Date Jan 2023   - check blood sugar at prescribed times - check blood sugar before and after exercise - check blood sugar if I feel it is too high or too low - take the blood sugar meter to all doctor visits -Use Freestyle Libre to monitor sugar continuously    Why is this important?   Checking your blood sugar at home helps to keep it from getting very high or very low.  Writing the results in a diary or log helps the doctor know how to care for you.  Your blood sugar log should have the time, date and the results.  Also, write down the amount of insulin or other medicine that you take.  Other information, like what you ate, exercise done and how you were feeling, will also be helpful.     Notes:         Care Plan : [place]  Updates made by [name], RPH since 09/26/2021 12:00 AM     Problem: Hypertension, Hyperlipidemia, Diabetes, Atrial Fibrillation, Heart Failure, Coronary Artery Disease, and Chronic Kidney Disease   Priority: High     Long-Range Goal: Disease management   Start Date: 06/29/2021  Expected End Date:  06/29/2022  This Visit's Progress: On track  Recent Progress: On track  Priority: High  Note:   Current Barriers:  Unable to independently monitor therapeutic efficacy  Pharmacist Clinical Goal(s):  Patient will achieve adherence to monitoring guidelines and medication adherence to achieve therapeutic efficacy through collaboration with PharmD and provider.   Interventions: 1:1 collaboration with [name], MD regarding development and update of comprehensive plan of care as evidenced by provider attestation and co-signature Inter-disciplinary care team collaboration (see longitudinal plan of care) Comprehensive medication review performed; medication list updated in electronic medical record  Hyperlipidemia: (LDL goal < 70) -Controlled - LDL is a goal; pt reports compliance with medications as prescribed; she endorses some bleeding issues - when she nicks herself  it takes longer to stop bleeding, she denies blood in urine/stool -Hx CAD - MI 07/2012 w/ stent; Hx stroke -Current treatment: Atorvastatin 10 mg daily Isosorbide MN 30 mg daily Nitroglycerin 0.4 mg SL prn Clopidogrel 75 mg daily HS Aspirin 81 mg daily OTC Omega-3 Fish oil 1000 mg BID -Educated on Cholesterol goals; Benefits of statin for ASCVD risk reduction; -Counseled on benefits of clopidogrel/aspirin for stent patency; discussed nuisance bleeding vs major bleeding; counseled on signs/symptoms of major bleeding and when to seek emergency care -Recommended to continue current medication  Diabetes (A1c goal <7%) -Not ideally controlled - A1c is at goal; pt endorses compliance with insulin regimen as prescribed; she reports she typically takes Novolog twice a day since she eats 2 meals per day - usually ~5 units in AM, 6-7 units with PM meal -Pt reports rare hypoglycemia; she uses peanut butter for hypoglycemic episodes -Hx toe amputation, osteomyelitis -Current home glucose readings fasting glucose: 130-190 post  prandial glucose: 300 -Current medications: Lantus 22 units daily HS Novolog 5-10 units TID per sliding scale - usually BID Testing supplies -Medications previously tried: Trulicity, glimepiride, Jardiance, metformin, Januvia -Educated on A1c and blood sugar goals; Complications of diabetes including kidney damage, retinal damage, and cardiovascular disease; -Pt would benefit from GLP-1 agonist for CV risk reduction, weight loss benefits; pt is not open to med changes today, may consider in future -Recommended to continue current medication  Atrial Tachyardia/NSVT/AFIB (Goal: prevent stroke and major bleeding) -Controlled - pt reports palpitations have improved since being on amiodarone -Clarified Afib dx previously - pt had 1 hr of SCAF on device, no repeat events; cardiology has not recommended anticoagulation -CHADSVASC: 8; per chart review patient has never been on anticoagulation for Afib (was briefly on Eliquis for a DVT in 2016); she is currently on DAPT for hx CAD w/ stent -Current treatment: Rate control: Amiodarone 200 mg daily, carvedilol 3.125 mg AM Anticoagulation: none -Medications previously tried: Eliquis (for DVT, 2016) -Counseled on benefits of rate/rhythm control drugs to prevent tachycardia -Recommended to continue current medication  Heart Failure (Goal: manage symptoms and prevent exacerbations) -Controlled - pt reports compliance with medications; she denies excess swelling; she follows regularly with cardiology/HF clinic; she reports HF doctor started Jardiance but her nephrologist does not want her on that -Current home BP/HR readings: 139/80, HR high 50s-low 60s -Last ejection fraction: 50-55% (Date: 03/07/20) -HF type: Diastolic; NYHA Class: I-II -Current treatment: Carvedilol 3.125 mg BID Isosorbide MN 30 mg daily Losartan 25 mg - 1/2 tab daily Torsemide 20 mg daily -Medications previously tried: Jardiance 10 mg -Educated on Benefits of medications for  managing symptoms and prolonging life; Importance of blood pressure control -Discussed SGLT2i (Jardiance) - given comorbidities (CKD, HF, DM) data supports use of SGLT2i; pt does have recent amputation of toe and SGLT2i have been linked to higher amputation risk, usually in patients with A1c >8; given recency of amputation it is reasonable to hold off restarting Jardiance, but ultimately benefits of SGLT2 likely outweigh risk in this patient -Recommended to continue current medication  CKD stage 3b / HyperPTH (Goal: prevent progression) -Controlled - most recent GFR is improved (previously pt was in Stage 4 CKD) -Current treatment  Calcitriol 0.5 mg daily AM Ferrous sulfate 325 mg BID Losartan 25 mg - 1/2 tab daily -Counseled on maintaining adequate hydration; importance of BP and DM control to prevent CKD progression; avoidance of NSAIDs and other nephrotoxins -Recommended to continue current medication  Neuropathy (Goal: manage symptoms) -Controlled -  pt is taking higher dose of gabapentin than prescribed (pt was previously prescribed 3 tab/day); she reports pain is not controlled unless she takes at least 2 gabapentin at night, she is willing to cut out AM dose -Current treatment  Duloxetine 30 mg daily HS Gabapentin 600 mg  - 1 AM, 2 HS Tramadol 50 mg PRN - 2 HS -Recommended to reduce gabapentin to 600 mg HS; pt is willing to cut out AM dose but will continue 2 tablets HS  Health Maintenance -Vaccine gaps: Shingrix, covid booster, flu -Advised to get Flu vaccine as soon as able  Patient Goals/Self-Care Activities Patient will:  - take medications as prescribed -focus on medication adherence by pill box -check glucose daily, document, and provide at future appointments -check blood pressure daily, document, and provide at future appointments -Get Flu vaccine -Call CCM Pharmacist for help with [facility]      Patient verbalizes understanding of instructions provided today  and agrees to view in [place].  Telephone follow up appointment with pharmacy team member scheduled for: 2 weeks  Charlene Brooke, PharmD, [facility] Clinical Pharmacist [facility] at [facility] [phone]

## 2021-09-27 MED ORDER — FREESTYLE LIBRE 2 SENSOR MISC: 12 refills | Status: DC

## 2021-09-27 MED ORDER — FREESTYLE LIBRE 2 READER DEVI: 0 refills | Status: DC

## 2021-09-27 NOTE — Telephone Encounter (Signed)
I sent the order for the meter.  Please see about getting her a lab visit set up at [facility].  Thanks.

## 2021-09-27 NOTE — Addendum Note (Signed)
Addended by: [name] on: 09/27/2021 12:08 AM   Modules accepted: Orders

## 2021-09-27 NOTE — Telephone Encounter (Signed)
Spoke with patient and set up lab appt for 10/03/21 at 11:30 am for fasting labs.

## 2021-09-27 NOTE — Assessment & Plan Note (Signed)
DM2.   We talked about checking pre and post meal sugars. She can call back with that info. She also needs labs set up at [facility] station.  I will check on getting that set up.  I will see if she can qualify for a freestyle meter.  In the meantime would continue with her Lantus and sliding scale insulin as is.  I will await update from patient and we will go from there.

## 2021-10-03 ENCOUNTER — Other Ambulatory Visit (INDEPENDENT_AMBULATORY_CARE_PROVIDER_SITE_OTHER): Payer: HMO

## 2021-10-03 ENCOUNTER — Other Ambulatory Visit: Payer: Self-pay

## 2021-10-03 DIAGNOSIS — E1142 Type 2 diabetes mellitus with diabetic polyneuropathy: Secondary | ICD-10-CM

## 2021-10-03 DIAGNOSIS — Z23 Encounter for immunization: Secondary | ICD-10-CM

## 2021-10-03 LAB — LIPID PANEL
Cholesterol: 96 mg/dL (ref 0–200)
HDL: 38.9 mg/dL — ABNORMAL LOW (ref >39.00)
LDL Cholesterol: 43 mg/dL (ref 0–99)
NonHDL: 57.15
Total CHOL/HDL Ratio: 2
Triglycerides: 70 mg/dL (ref 0.0–149.0)
VLDL: 14 mg/dL (ref 0.0–40.0)

## 2021-10-03 LAB — COMPREHENSIVE METABOLIC PANEL
ALT: 7 U/L (ref 0–35)
AST: 10 U/L (ref 0–37)
Albumin: 3.2 g/dL — ABNORMAL LOW (ref 3.5–5.2)
Alkaline Phosphatase: 82 U/L (ref 39–117)
BUN: 50 mg/dL — ABNORMAL HIGH (ref 6–23)
CO2: 29 mEq/L (ref 19–32)
Calcium: 9.1 mg/dL (ref 8.4–10.5)
Chloride: 103 mEq/L (ref 96–112)
Creatinine, Ser: 1.82 mg/dL — ABNORMAL HIGH (ref 0.40–1.20)
GFR: 28.39 mL/min — ABNORMAL LOW (ref >60.00)
Glucose, Bld: 77 mg/dL (ref 70–99)
Potassium: 4.6 mEq/L (ref 3.5–5.1)
Sodium: 141 mEq/L (ref 135–145)
Total Bilirubin: 0.3 mg/dL (ref 0.2–1.2)
Total Protein: 6.4 g/dL (ref 6.0–8.3)

## 2021-10-03 LAB — CBC WITH DIFFERENTIAL/PLATELET
Basophils Absolute: 0 10*3/uL (ref 0.0–0.1)
Basophils Relative: 0.4 % (ref 0.0–3.0)
Eosinophils Absolute: 0.1 10*3/uL (ref 0.0–0.7)
Eosinophils Relative: 0.9 % (ref 0.0–5.0)
HCT: 28.4 % — ABNORMAL LOW (ref 36.0–46.0)
Hemoglobin: 9.1 g/dL — ABNORMAL LOW (ref 12.0–15.0)
Lymphocytes Relative: 13.6 % (ref 12.0–46.0)
Lymphs Abs: 1.2 10*3/uL (ref 0.7–4.0)
MCHC: 32.1 g/dL (ref 30.0–36.0)
MCV: 83.4 fl (ref 78.0–100.0)
Monocytes Absolute: 0.7 10*3/uL (ref 0.1–1.0)
Monocytes Relative: 8 % (ref 3.0–12.0)
Neutro Abs: 6.8 10*3/uL (ref 1.4–7.7)
Neutrophils Relative %: 77.1 % — ABNORMAL HIGH (ref 43.0–77.0)
Platelets: 257 10*3/uL (ref 150.0–400.0)
RBC: 3.41 Mil/uL — ABNORMAL LOW (ref 3.87–5.11)
RDW: 15.3 % (ref 11.5–15.5)
WBC: 8.8 10*3/uL (ref 4.0–10.5)

## 2021-10-03 LAB — HEMOGLOBIN A1C: Hgb A1c MFr Bld: 8.1 % — ABNORMAL HIGH (ref 4.6–6.5)

## 2021-10-06 DIAGNOSIS — I10 Essential (primary) hypertension: Secondary | ICD-10-CM

## 2021-10-06 DIAGNOSIS — I2510 Atherosclerotic heart disease of native coronary artery without angina pectoris: Secondary | ICD-10-CM

## 2021-10-06 DIAGNOSIS — N184 Chronic kidney disease, stage 4 (severe): Secondary | ICD-10-CM

## 2021-10-06 DIAGNOSIS — E7800 Pure hypercholesterolemia, unspecified: Secondary | ICD-10-CM

## 2021-10-06 DIAGNOSIS — I5022 Chronic systolic (congestive) heart failure: Secondary | ICD-10-CM

## 2021-10-06 DIAGNOSIS — E1142 Type 2 diabetes mellitus with diabetic polyneuropathy: Secondary | ICD-10-CM

## 2021-10-09 DIAGNOSIS — G4733 Obstructive sleep apnea (adult) (pediatric): Secondary | ICD-10-CM | POA: Diagnosis not present

## 2021-10-09 DIAGNOSIS — I5022 Chronic systolic (congestive) heart failure: Secondary | ICD-10-CM | POA: Diagnosis not present

## 2021-10-09 DIAGNOSIS — M86171 Other acute osteomyelitis, right ankle and foot: Secondary | ICD-10-CM | POA: Diagnosis not present

## 2021-10-09 DIAGNOSIS — J189 Pneumonia, unspecified organism: Secondary | ICD-10-CM | POA: Diagnosis not present

## 2021-10-12 ENCOUNTER — Emergency Department (HOSPITAL_COMMUNITY)
Admission: EM | Admit: 2021-10-12 | Discharge: 2021-10-13 | Disposition: A | Discharge status: Home / Self Care | Payer: HMO | Attending: Emergency Medicine | Admitting: Emergency Medicine

## 2021-10-12 ENCOUNTER — Telehealth: Payer: Self-pay

## 2021-10-12 ENCOUNTER — Emergency Department (HOSPITAL_COMMUNITY): Payer: HMO

## 2021-10-12 ENCOUNTER — Other Ambulatory Visit: Payer: Self-pay

## 2021-10-12 DIAGNOSIS — M25561 Pain in right knee: Secondary | ICD-10-CM | POA: Insufficient documentation

## 2021-10-12 DIAGNOSIS — I509 Heart failure, unspecified: Secondary | ICD-10-CM | POA: Insufficient documentation

## 2021-10-12 DIAGNOSIS — I110 Hypertensive heart disease with heart failure: Secondary | ICD-10-CM | POA: Diagnosis not present

## 2021-10-12 DIAGNOSIS — Z89421 Acquired absence of other right toe(s): Secondary | ICD-10-CM | POA: Diagnosis not present

## 2021-10-12 DIAGNOSIS — R2243 Localized swelling, mass and lump, lower limb, bilateral: Secondary | ICD-10-CM | POA: Insufficient documentation

## 2021-10-12 DIAGNOSIS — R34 Anuria and oliguria: Secondary | ICD-10-CM | POA: Diagnosis not present

## 2021-10-12 DIAGNOSIS — Z7982 Long term (current) use of aspirin: Secondary | ICD-10-CM | POA: Insufficient documentation

## 2021-10-12 DIAGNOSIS — R609 Edema, unspecified: Secondary | ICD-10-CM | POA: Diagnosis not present

## 2021-10-12 DIAGNOSIS — I5023 Acute on chronic systolic (congestive) heart failure: Secondary | ICD-10-CM | POA: Diagnosis not present

## 2021-10-12 DIAGNOSIS — Z89422 Acquired absence of other left toe(s): Secondary | ICD-10-CM | POA: Diagnosis not present

## 2021-10-12 DIAGNOSIS — R0602 Shortness of breath: Secondary | ICD-10-CM | POA: Diagnosis not present

## 2021-10-12 LAB — BASIC METABOLIC PANEL
Anion gap: 9 (ref 5–15)
BUN: 52 mg/dL — ABNORMAL HIGH (ref 8–23)
CO2: 27 mmol/L (ref 22–32)
Calcium: 10.2 mg/dL (ref 8.9–10.3)
Chloride: 100 mmol/L (ref 98–111)
Creatinine, Ser: 1.87 mg/dL — ABNORMAL HIGH (ref 0.44–1.00)
GFR, Estimated: 29 mL/min — ABNORMAL LOW (ref >60)
Glucose, Bld: 173 mg/dL — ABNORMAL HIGH (ref 70–99)
Potassium: 4.4 mmol/L (ref 3.5–5.1)
Sodium: 136 mmol/L (ref 135–145)

## 2021-10-12 LAB — CBC WITH DIFFERENTIAL/PLATELET
Abs Immature Granulocytes: 0.04 10*3/uL (ref 0.00–0.07)
Basophils Absolute: 0 10*3/uL (ref 0.0–0.1)
Basophils Relative: 0 %
Eosinophils Absolute: 0.1 10*3/uL (ref 0.0–0.5)
Eosinophils Relative: 1 %
HCT: 31 % — ABNORMAL LOW (ref 36.0–46.0)
Hemoglobin: 9.4 g/dL — ABNORMAL LOW (ref 12.0–15.0)
Immature Granulocytes: 1 %
Lymphocytes Relative: 18 %
Lymphs Abs: 1.5 10*3/uL (ref 0.7–4.0)
MCH: 26.3 pg (ref 26.0–34.0)
MCHC: 30.3 g/dL (ref 30.0–36.0)
MCV: 86.6 fL (ref 80.0–100.0)
Monocytes Absolute: 0.7 10*3/uL (ref 0.1–1.0)
Monocytes Relative: 9 %
Neutro Abs: 5.7 10*3/uL (ref 1.7–7.7)
Neutrophils Relative %: 71 %
Platelets: 309 10*3/uL (ref 150–400)
RBC: 3.58 MIL/uL — ABNORMAL LOW (ref 3.87–5.11)
RDW: 14.4 % (ref 11.5–15.5)
WBC: 8 10*3/uL (ref 4.0–10.5)
nRBC: 0 % (ref 0.0–0.2)

## 2021-10-12 LAB — TROPONIN I (HIGH SENSITIVITY)
Troponin I (High Sensitivity): 15 ng/L (ref <18)
Troponin I (High Sensitivity): 15 ng/L (ref <18)

## 2021-10-12 LAB — BRAIN NATRIURETIC PEPTIDE: B Natriuretic Peptide: 207 pg/mL — ABNORMAL HIGH (ref 0.0–100.0)

## 2021-10-12 NOTE — ED Provider Triage Note (Signed)
Emergency Medicine Provider Triage Evaluation Note  [name] , a 66 y.o. female  was evaluated in triage.  Pt complains of lower extremity swelling.  Patient states that for 1 week she has had worsening bilateral lower extremity swelling.  She has a history of congestive heart failure but only takes 20 mg of Lasix in the morning.  She states that she has had increased fluid buildup and feels tight in her abdomen.  She denies chest pain or shortness of breath.  Denies unilateral leg swelling.  Review of Systems  Positive: See above Negative:   Physical Exam  BP (!) 151/63 (BP Location: Left Arm)    Pulse (!) 59    Temp 98.5 F (36.9 C) (Oral)    Resp 16    SpO2 100%  Gen:   Awake, no distress   Resp:  Normal effort  MSK:   Moves extremities without difficulty Other:  Bilateral lower extremities with 2+ pitting edema up to the knee.  Medical Decision Making  Medically screening exam initiated at 5:42 PM.  Appropriate orders placed.  [name] was informed that the remainder of the evaluation will be completed by another provider, this initial triage assessment does not replace that evaluation, and the importance of remaining in the ED until their evaluation is complete.     Vickie Hillier, PA-C 10/12/21 1743

## 2021-10-12 NOTE — Progress Notes (Signed)
Chronic Care Management Pharmacy Assistant   Name: [name]  MRN: [id number] DOB: [birth year]  Reason for Encounter: CCM (Appointment Reminder)   Medications: Outpatient Encounter Medications as of 10/12/2021  Medication Sig   amiodarone (PACERONE) 200 MG tablet TAKE 1 TABLET BY MOUTH ONCE DAILY   aspirin EC 81 MG EC tablet Take 1 tablet (81 mg total) by mouth daily. Swallow whole.   atorvastatin (LIPITOR) 10 MG tablet Take 1 tablet (10 mg total) by mouth daily.   calcitRIOL (ROCALTROL) 0.5 MCG capsule Take 0.5 mcg by mouth every morning.   Calcium Carb-Cholecalciferol (CALCIUM CARBONATE-VITAMIN D3) 600-400 MG-UNIT TABS Take 1 tablet by mouth 2 (two) times a day.   carvedilol (COREG) 3.125 MG tablet Take 3.125 mg by mouth every morning.   clopidogrel (PLAVIX) 75 MG tablet Take 1 tablet (75 mg total) by mouth daily. NO FURTHER REFILLS UNTIL SEEN IN CLINIC, PLEASE CALL OFFICE TO SCHEDULE AN APPOINTMENT.   Continuous Blood Gluc Receiver (FREESTYLE LIBRE 2 READER) DEVI Use to check sugar multiple times per day as directed.  H/o DM2, treated with insulin.   Continuous Blood Gluc Sensor (FREESTYLE LIBRE 2 SENSOR) MISC Use to check sugar multiple times per day as directed.  H/o DM2, treated with insulin.   DULoxetine (CYMBALTA) 30 MG capsule Take 1 capsule (30 mg total) by mouth at bedtime.   ferrous sulfate 325 (65 FE) MG tablet Take 325 mg by mouth 2 (two) times daily with a meal.   gabapentin (NEURONTIN) 600 MG tablet Take 300mg  at bedtime. (Patient taking differently: Take 1,200 mg by mouth at bedtime. Take 300mg  at bedtime.)   Insulin Aspart FlexPen (NOVOLOG) 100 UNIT/ML INJECT 0.05 ML (5 UNITS TOTAL) INTO THE SKIN 3 TIMES DAILY BEFORE MEALS   Insulin Pen Needle (PEN NEEDLES) 32G X 6 MM MISC 1 each by Does not apply route 4 (four) times daily.   isosorbide mononitrate (IMDUR) 30 MG 24 hr tablet TAKE 1 TABLET BY MOUTH ONCE DAILY   LANTUS SOLOSTAR 100 UNIT/ML Solostar Pen INJECT 22  UNITS UNDER THE SKIN DAILY.   levothyroxine (SYNTHROID) 50 MCG tablet Take 1 tablet (50 mcg total) by mouth daily before breakfast.   losartan (COZAAR) 25 MG tablet TAKE 1/2 TABLET BY MOUTH DAILY   Multiple Vitamin (MULTIVITAMIN WITH MINERALS) TABS tablet Take 1 tablet by mouth every morning.   nitroGLYCERIN (NITROSTAT) 0.4 MG SL tablet Place 1 tablet (0.4 mg total) under the tongue every 5 (five) minutes as needed for chest pain.   Omega-3 Fatty Acids (FISH OIL) 1000 MG CAPS Take 1,000 mg by mouth 2 (two) times a day.   ONETOUCH ULTRA test strip UUD TO CHECK BLOOD SUGAR 3 TIMES DAILY   torsemide (DEMADEX) 20 MG tablet Take 1 tablet (20 mg total) by mouth daily.   traMADol (ULTRAM) 50 MG tablet TAKE 1 TO 2 TABLET BY MOUTH AT BEDTIME AS NEEDED   No facility-administered encounter medications on file as of 10/12/2021.   [name] was contacted to remind of upcoming telephone visit with Charlene Brooke on 10/17/2021 at 3:30 pm. Patient was reminded to have all medications, supplements and any blood glucose and blood pressure readings available for review at appointment. If unable to reach, a voicemail was left for patient.   Are you having any problems with your medications? No   Do you have any concerns you like to discuss with the pharmacist? No  Star Rating Drugs: Medication:  Last Fill: Day Supply Atorvastatin  10 mg 09/07/2021 30 Losartan 25 mg 09/07/2021 90 Novolog 100 unit 09/04/2021 100  Lantus 100 unit 08/04/2021 68 Fill dates verified with [place], CPP notified  Marijean Niemann, [place] Pharmacy Assistant [phone]  Time Spent: 10 Minutes

## 2021-10-12 NOTE — ED Triage Notes (Addendum)
Pt here via EMS for c/o bilateral leg swelling X1 week. Pt alert and oriented X4. Pt has history of CHF. Abdomen swelling noted. Endorses some SOB  146 palpated HR 70 99% RA  186 CBG

## 2021-10-13 MED ORDER — FUROSEMIDE 10 MG/ML IJ SOLN
60.0000 mg | Freq: Once | INTRAMUSCULAR | Status: AC
  Administered 2021-10-13: 60 mg via INTRAVENOUS
  Filled 2021-10-13: qty 6

## 2021-10-13 NOTE — Discharge Instructions (Addendum)
Take double your torsemide dose for the next 5 days.  Schedule follow-up with cardiology for as soon as possible.  If your swelling worsens or you become more short of breath, return to the ER.

## 2021-10-13 NOTE — ED Provider Notes (Signed)
[facility] EMERGENCY DEPARTMENT Provider Note   CSN: [id number] Arrival date & time: 10/12/21  1604     History  Chief Complaint  Patient presents with   Leg Swelling    [name] is a 66 y.o. female.  Patient presents to the emergency department for evaluation of swelling of her legs for 1 week.  Patient reports a history of congestive heart failure.  She takes torsemide.  Patient does not adjust her torsemide for increased swelling at home.  Patient complains of mild shortness of breath and tightness in the abdomen with some distention.  She has been complaining of right knee pain as well.  Patient denies any injury to the knee.  She reports that she has "bone-on-bone" in that knee and is hurting her tonight.      Home Medications Prior to Admission medications   Medication Sig Start Date End Date Taking? Authorizing Provider  acetaminophen (TYLENOL) 500 MG tablet Take 1,500 mg by mouth every 6 (six) hours as needed for moderate pain or headache.   Yes [provider]  amiodarone (PACERONE) 200 MG tablet TAKE 1 TABLET BY MOUTH ONCE DAILY Patient taking differently: Take 200 mg by mouth daily. 04/23/21  Yes Bensimhon, Shaune Pascal, MD  aspirin EC 81 MG EC tablet Take 1 tablet (81 mg total) by mouth daily. Swallow whole. 02/07/21  Yes Nita Sells, MD  atorvastatin (LIPITOR) 10 MG tablet Take 1 tablet (10 mg total) by mouth daily. Patient taking differently: Take 10 mg by mouth at bedtime. 04/16/21  Yes Geradine Girt, DO  calcitRIOL (ROCALTROL) 0.5 MCG capsule Take 0.5 mcg by mouth every morning. 12/25/20  Yes [provider]  Calcium Carb-Cholecalciferol (CALCIUM CARBONATE-VITAMIN D3) 600-400 MG-UNIT TABS Take 1 tablet by mouth 2 (two) times a day.   Yes [provider]  carvedilol (COREG) 3.125 MG tablet Take 3.125 mg by mouth in the morning and at bedtime.   Yes [provider]  clopidogrel (PLAVIX) 75 MG tablet Take  1 tablet (75 mg total) by mouth daily. NO FURTHER REFILLS UNTIL SEEN IN CLINIC, PLEASE CALL OFFICE TO SCHEDULE AN APPOINTMENT. Patient taking differently: Take 75 mg by mouth at bedtime. 09/21/21  Yes Minna Merritts, MD  Continuous Blood Gluc Receiver (FREESTYLE LIBRE 2 READER) DEVI Use to check sugar multiple times per day as directed.  H/o DM2, treated with insulin. 09/27/21  Yes [name], MD  Continuous Blood Gluc Sensor (FREESTYLE LIBRE 2 SENSOR) MISC Use to check sugar multiple times per day as directed.  H/o DM2, treated with insulin. 09/27/21  Yes [name], MD  DULoxetine (CYMBALTA) 30 MG capsule Take 1 capsule (30 mg total) by mouth at bedtime. 01/26/21  Yes Patel, Donika K, DO  ferrous sulfate 325 (65 FE) MG tablet Take 325 mg by mouth 2 (two) times daily with a meal.   Yes [provider]  gabapentin (NEURONTIN) 600 MG tablet Take 300mg  at bedtime. Patient taking differently: Take 1,200 mg by mouth at bedtime. Take 300mg  at bedtime. 07/23/21  Yes Patel, Donika K, DO  Insulin Aspart FlexPen (NOVOLOG) 100 UNIT/ML INJECT 0.05 ML (5 UNITS TOTAL) INTO THE SKIN 3 TIMES DAILY BEFORE MEALS Patient taking differently: Inject 5 Units into the skin in the morning, at noon, and at bedtime. 09/04/21  Yes [name], MD  Insulin Pen Needle (PEN NEEDLES) 32G X 6 MM MISC 1 each by Does not apply route 4 (four) times daily.  09/06/19  Yes Jacelyn Pi, Irma M, MD  isosorbide mononitrate (IMDUR) 30 MG 24 hr tablet TAKE 1 TABLET BY MOUTH ONCE DAILY Patient taking differently: Take 30 mg by mouth daily. 07/05/21  Yes Bensimhon, Shaune Pascal, MD  LANTUS SOLOSTAR 100 UNIT/ML Solostar Pen INJECT 22 UNITS UNDER THE SKIN DAILY. Patient taking differently: Inject 22 Units into the skin at bedtime. 10/02/20  Yes [name], MD  levothyroxine (SYNTHROID) 50 MCG tablet Take 1 tablet (50 mcg total) by mouth daily before breakfast. 04/23/21  Yes [name], MD  losartan (COZAAR) 25  MG tablet TAKE 1/2 TABLET BY MOUTH DAILY Patient taking differently: Take 12.5 mg by mouth daily. 06/12/21  Yes Bensimhon, Shaune Pascal, MD  Multiple Vitamin (MULTIVITAMIN WITH MINERALS) TABS tablet Take 1 tablet by mouth every morning.   Yes [provider]  nitroGLYCERIN (NITROSTAT) 0.4 MG SL tablet Place 1 tablet (0.4 mg total) under the tongue every 5 (five) minutes as needed for chest pain. 03/28/20 05/21/22 Yes Deboraha Sprang, MD  Omega-3 Fatty Acids (FISH OIL) 1000 MG CAPS Take 1,000 mg by mouth 2 (two) times a day.   Yes [provider]  torsemide (DEMADEX) 20 MG tablet Take 1 tablet (20 mg total) by mouth daily. 05/21/21 10/13/21 Yes Mil[place], Maricela Bo, FNP  traMADol (ULTRAM) 50 MG tablet TAKE 1 TO 2 TABLET BY MOUTH AT BEDTIME AS NEEDED Patient taking differently: Take 100 mg by mouth at bedtime. 09/16/21  Yes [name], MD      Allergies    Heparin and Nsaids    Review of Systems   Review of Systems  Physical Exam Updated Vital Signs BP (!) 161/48    Pulse 69    Temp 98.4 F (36.9 C) (Oral)    Resp 16    SpO2 98%  Physical Exam Vitals and nursing note reviewed.  Constitutional:      General: She is not in acute distress.    Appearance: Normal appearance. She is well-developed.  HENT:     Head: Normocephalic and atraumatic.     Right Ear: Hearing normal.     Left Ear: Hearing normal.     Nose: Nose normal.  Eyes:     Conjunctiva/sclera: Conjunctivae normal.     Pupils: Pupils are equal, round, and reactive to light.  Cardiovascular:     Rate and Rhythm: Regular rhythm.     Heart sounds: S1 normal and S2 normal. No murmur heard.   No friction rub. No gallop.  Pulmonary:     Effort: Pulmonary effort is normal. No respiratory distress.     Breath sounds: Normal breath sounds.  Chest:     Chest wall: No tenderness.  Abdominal:     General: Bowel sounds are normal.     Palpations: Abdomen is soft.     Tenderness: There is no abdominal tenderness. There  is no guarding or rebound. Negative signs include Murphy's sign and McBurney's sign.     Hernia: No hernia is present.  Musculoskeletal:        General: Normal range of motion.     Cervical back: Normal range of motion and neck supple.     Right knee: No swelling, deformity, effusion or erythema. Tenderness present.     Right lower leg: 3+ Pitting Edema present.     Left lower leg: 3+ Pitting Edema present.  Skin:    General: Skin is warm and dry.     Findings: No rash.  Neurological:     Mental Status: She is alert and oriented to person, place, and time.     GCS: GCS eye subscore is 4. GCS verbal subscore is 5. GCS motor subscore is 6.     Cranial Nerves: No cranial nerve deficit.     Sensory: No sensory deficit.     Coordination: Coordination normal.  Psychiatric:        Speech: Speech normal.        Behavior: Behavior normal.        Thought Content: Thought content normal.    ED Results / Procedures / Treatments   Labs (all labs ordered are listed, but only abnormal results are displayed) Labs Reviewed  BASIC METABOLIC PANEL - Abnormal; Notable for the following components:      Result Value   Glucose, Bld 173 (*)    BUN 52 (*)    Creatinine, Ser 1.87 (*)    GFR, Estimated 29 (*)    All other components within normal limits  BRAIN NATRIURETIC PEPTIDE - Abnormal; Notable for the following components:   B Natriuretic Peptide 207.0 (*)    All other components within normal limits  CBC WITH DIFFERENTIAL/PLATELET - Abnormal; Notable for the following components:   RBC 3.58 (*)    Hemoglobin 9.4 (*)    HCT 31.0 (*)    All other components within normal limits  TROPONIN I (HIGH SENSITIVITY)  TROPONIN I (HIGH SENSITIVITY)    EKG EKG Interpretation  Date/Time:  Friday October 12 2021 17:39:01 EST Ventricular Rate:  60 PR Interval:  132 QRS Duration: 168 QT Interval:  512 QTC Calculation: 512 R Axis:   263 Text Interpretation: Atrial-sensed ventricular-paced rhythm  with occasional AV dual-paced complexes and with occasional Premature ventricular complexes Abnormal ECG When compared with ECG of 21-May-2021 11:23, PREVIOUS ECG IS PRESENT Confirmed by Orpah Greek [phone]) on 10/13/2021 2:45:07 AM  Radiology DG Chest 2 View  Result Date: 10/12/2021 CLINICAL DATA:  Short of breath, bilateral lower extremity edema EXAM: CHEST - 2 VIEW COMPARISON:  11/27/2020 FINDINGS: Frontal and lateral views of the chest demonstrate multi lead pacer/AICD unchanged. Cardiac silhouette is stable. No airspace disease, effusion, or pneumothorax. No acute bony abnormalities. IMPRESSION: 1. Stable chest, no acute process. Electronically Signed   By: Randa Ngo M.D.   On: 10/12/2021 19:02    Procedures Procedures    Medications Ordered in ED Medications  furosemide (LASIX) injection 60 mg (60 mg Intravenous Given 10/13/21 0433)    ED Course/ Medical Decision Making/ A&P                           Medical Decision Making  Patient with previous history of congestive heart failure, diabetes, obstructive sleep apnea, renal insufficiency, CVA, CAD, NSTEMI , pacemaker presents to the emergency department with swelling of her legs, abdomen and shortness of breath.  Patient sinus rhythm on the monitor.  Chest x-ray, independently reviewed and interpreted by myself does not reveal any evidence of pulmonary edema.  Patient does have increased swelling of her legs and pitting edema on exam.  Clinically she appears well.  No hypoxia.  Chest x-ray does not show pneumonia, congestive heart failure, acute pathology.  BNP is slightly elevated in the 200 range.  No signs of acute coronary syndrome.  As patient does not have any hypoxia or new oxygen demand, does not require hospitalization.  She likely is experiencing congestive heart failure exacerbation despite  taking her 20 mg of torsemide daily.  We will give IV Lasix to initiate diuresis, increase her torsemide to 40 mg daily for  5 days.  Follow-up with her cardiologist.         Final Clinical Impression(s) / ED Diagnoses Final diagnoses:  Acute on chronic congestive heart failure, unspecified heart failure type [facility])    Rx / DC Orders ED Discharge Orders     None         Orpah Greek, MD 10/13/21 [phone]

## 2021-10-17 ENCOUNTER — Telehealth: Payer: Self-pay | Admitting: Pharmacist

## 2021-10-17 ENCOUNTER — Other Ambulatory Visit: Payer: Self-pay

## 2021-10-17 ENCOUNTER — Ambulatory Visit (INDEPENDENT_AMBULATORY_CARE_PROVIDER_SITE_OTHER): Payer: HMO | Admitting: Pharmacist

## 2021-10-17 DIAGNOSIS — I10 Essential (primary) hypertension: Secondary | ICD-10-CM

## 2021-10-17 DIAGNOSIS — E7800 Pure hypercholesterolemia, unspecified: Secondary | ICD-10-CM

## 2021-10-17 DIAGNOSIS — N184 Chronic kidney disease, stage 4 (severe): Secondary | ICD-10-CM

## 2021-10-17 DIAGNOSIS — I5022 Chronic systolic (congestive) heart failure: Secondary | ICD-10-CM

## 2021-10-17 DIAGNOSIS — I2510 Atherosclerotic heart disease of native coronary artery without angina pectoris: Secondary | ICD-10-CM

## 2021-10-17 MED ORDER — OZEMPIC (0.25 OR 0.5 MG/DOSE) 2 MG/1.5ML ~~LOC~~ SOPN: 0.2500 mg | PEN_INJECTOR | SUBCUTANEOUS | 1 refills | Status: DC

## 2021-10-17 NOTE — Telephone Encounter (Signed)
Agree.  I sent the rx for ozempic.  Please let her know.  Can you follow up with her in 1 month about her sugars and potentially increasing her dose?  Please let me know.  Thanks.

## 2021-10-17 NOTE — Patient Instructions (Signed)
Visit Information  Phone number for Pharmacist: [phone]   Goals Addressed             This Visit's Progress    Monitor and Manage My Blood Sugar-Diabetes Type 2       Timeframe:  Long-Range Goal Priority:  High Start Date:      09/26/21                       Expected End Date:    09/26/22                   Follow Up Date Feb 2023   - check blood sugar at prescribed times - check blood sugar before and after exercise - check blood sugar if I feel it is too high or too low - take the blood sugar meter to all doctor visits -Use Freestyle Libre to monitor sugar continuously    Why is this important?   Checking your blood sugar at home helps to keep it from getting very high or very low.  Writing the results in a diary or log helps the doctor know how to care for you.  Your blood sugar log should have the time, date and the results.  Also, write down the amount of insulin or other medicine that you take.  Other information, like what you ate, exercise done and how you were feeling, will also be helpful.     Notes:         Care Plan : [place]  Updates made by [name], RPH since 10/17/2021 12:00 AM     Problem: Hypertension, Hyperlipidemia, Diabetes, Atrial Fibrillation, Heart Failure, Coronary Artery Disease, and Chronic Kidney Disease   Priority: High     Long-Range Goal: Disease management   Start Date: 06/29/2021  Expected End Date: 06/29/2022  This Visit's Progress: Not on track  Recent Progress: On track  Priority: High  Note:   Current Barriers:  Unable to independently monitor therapeutic efficacy Unable to maintain control of DM, HF  Pharmacist Clinical Goal(s):  Patient will achieve adherence to monitoring guidelines and medication adherence to achieve therapeutic efficacy adhere to plan to optimize therapeutic regimen for DM, HF as evidenced by report of adherence to recommended medication management changes through  collaboration with PharmD and provider.   Interventions: 1:1 collaboration with [name], MD regarding development and update of comprehensive plan of care as evidenced by provider attestation and co-signature Inter-disciplinary care team collaboration (see longitudinal plan of care) Comprehensive medication review performed; medication list updated in electronic medical record  Hyperlipidemia: (LDL goal < 70) -Controlled - LDL is a goal; pt reports compliance with medications as prescribed; she endorses some bleeding issues - when she nicks herself it takes longer to stop bleeding, she denies blood in urine/stool -Hx CAD - MI 07/2012 w/ stent; Hx stroke -Current treatment: Atorvastatin 10 mg daily - Appropriate, Effective, Safe, Accessible Isosorbide MN 30 mg daily - Appropriate, Effective, Safe, Accessible Nitroglycerin 0.4 mg SL prn - Appropriate, Effective, Safe, Accessible Clopidogrel 75 mg daily HS - Appropriate, Effective, Safe, Accessible Aspirin 81 mg daily - Appropriate, Effective, Safe, Accessible -Educated on Cholesterol goals; Benefits of statin for ASCVD risk reduction; -Counseled on benefits of clopidogrel/aspirin for stent patency; discussed nuisance bleeding vs major bleeding; counseled on signs/symptoms of major bleeding and when to seek emergency care -Recommended to continue current medication  Diabetes (A1c goal <7%) -Uncontrolled, worsening- A1c is above  goal; pt endorses compliance with insulin regimen as prescribed; she reports she typically takes Novolog twice a day since she eats 2 meals per day - usually ~5 units in AM, 6-7 units with PM meal -Pt reports rare hypoglycemia; she uses peanut butter for hypoglycemic episodes -Hx toe amputation, osteomyelitis -Pt started using Freestyle Libre ~2 weeks ago and provides following readings -7-day avg glucose 198 Before  After  Before  After    Bedtime breakfast Breakfast Supper  Supper 1/1 103  264      154   1/2 142  305  186  225  179 1/3 188  320  288  188  188 1/4 144    288    315 1/5 174  303  256  298  228 1/6 167  202 1/7 240  266  260  254  233 1/8 144    130  254  226 1/9 164  343  288  382  249 1/10 111  242  237  193  148 1/11 110  202  170  308 -Current medications: Lantus 22 units daily HS Novolog 5-10 units TID per sliding scale - usually BID Testing supplies -Medications previously tried: Trulicity, glimepiride, Jardiance, metformin, Januvia -Fasting BG are slightly elevated, post-prandial BG after breakfast and supper are much higher, would recommend targeting post-prandial sugars with a GLP-1 agonist to limit risk for hypoglycemia -Recommend starting Ozempic 0.25 mg weekly, titrate up to 0.5 mg after 4 weeks  Atrial Tachyardia/NSVT/AFIB (Goal: prevent stroke and major bleeding) -Controlled - pt reports palpitations have improved since being on amiodarone -Clarified Afib dx previously - pt had 1 hr of SCAF on device, no repeat events; cardiology has not recommended anticoagulation -CHADSVASC: 8; per chart review patient has never been on anticoagulation for Afib (was briefly on Eliquis for a DVT in 2016); she is currently on DAPT for hx CAD w/ stent -Current treatment: Rate control: Amiodarone 200 mg daily, carvedilol 3.125 mg AM Anticoagulation: none -Medications previously tried: Eliquis (for DVT, 2016) -Counseled on benefits of rate/rhythm control drugs to prevent tachycardia -Recommended to continue current medication  Heart Failure (Goal: manage symptoms and prevent exacerbations) -Not ideally controlled - pt was seen in the ED last week for excess swelling/HF exacerbation and given IV lasix; she increased her torsemide to 3 tablets and reports leg swelling is back to normal -Current home BP/HR readings: 139/80, HR high 50s-low 60s -Last ejection fraction: 50-55% (Date: 03/07/20) -HF type:  Diastolic; NYHA Class: I-II -Current treatment: Carvedilol 3.125 mg BID Isosorbide MN 30 mg daily Losartan 25 mg - 1/2 tab daily Torsemide 20 mg daily -Medications previously tried: Jardiance 10 mg -Educated on Benefits of medications for managing symptoms and prolonging life; Importance of blood pressure control -Discussed SGLT2i (Jardiance) - given comorbidities (CKD, HF, DM) data supports use of SGLT2i; pt does have recent amputation of toe and SGLT2i have historically been linked to higher amputation risk, usually in patients with A1c >8; given recency of amputation it is reasonable to hold off restarting Jardiance, but ultimately benefits of SGLT2 likely outweigh risk in this patient -Advised pt return to torsemide 20 mg/day and call HF clinic for f/u appt  CKD stage 4 / HyperPTH (Goal: prevent progression) -Worsening - most recent GFR is worse (36 > 28 from Aug to Dec 2022), now in Stage 4 CKD -Current treatment  Calcitriol 0.5 mg daily AM Ferrous sulfate 325 mg BID Losartan 25 mg - 1/2 tab daily -Counseled on  maintaining adequate hydration; importance of BP and DM control to prevent CKD progression; avoidance of NSAIDs and other nephrotoxins -Recommended to continue current medication  Health Maintenance -Vaccine gaps: Shingrix, covid booster, flu -Advised to get Flu vaccine as soon as able  Patient Goals/Self-Care Activities Patient will:  - take medications as prescribed -focus on medication adherence by pill box -check glucose daily, document, and provide at future appointments -check blood pressure daily, document, and provide at future appointments -Email Freestyle report to PharmD -Call HF clinic (Dr Haroldine Laws) for f/u appt      Patient verbalizes understanding of instructions and care plan provided today and agrees to view in [place]. Active MyChart status confirmed with patient.   Telephone follow up appointment with pharmacy team member scheduled for: 1  month  Charlene Brooke, PharmD, [facility] Clinical Pharmacist [facility] [phone]

## 2021-10-17 NOTE — Telephone Encounter (Signed)
Patient has been wearing Freestyle Libre ~10 days and reports 7-day average is 198. She is not able to upload CGM report but has been keeping a BG log:  Before  After  Before  After   Bedtime breakfast Breakfast Supper  Supper 1/1 103  264      154   1/2 142  305  186  225  179 1/3 188  320  288  188  188 1/4 144    288    315 1/5 174  303  256  298  228 1/6 167  202 1/7 240  266  260  254  233 1/8 144    130  254  226 1/9 164  343  288  382  249 1/10 111  242  237  193  148 1/11 110  202  170  308   She is currently taking Lantus 22 units at bedtime, Novolog 5-6 units with breakfast and 6-7 units with supper (she eats 2 meals/day).   I would recommend targeting post-prandial BG as these are significantly more elevated than fasting. A GLP-1 agonist would be ideal to limit hypoglycemic risk and provide wt loss/CV protection - she may even be able to come off of Novolog eventually. Consider Ozempic 0.25 mg weekly, titrated up to 0.5 mg after 4 weeks.  Forwarding recommendation to PCP.

## 2021-10-17 NOTE — Addendum Note (Signed)
Addended by: [name] on: 10/17/2021 08:44 PM   Modules accepted: Orders

## 2021-10-17 NOTE — Progress Notes (Signed)
Chronic Care Management Pharmacy Note  10/17/2021 Name:  CAMELA WICH MRN:  [id number] DOB:  [birth year]  Summary: -Pt has been wearing Freestyle Libre. She reports 7-day avg 198. See assessment for full BG log.  -Pt has been taking torsemide 60 mg/day since ED visit 1/6 and reports leg swelling is back to normal  Recommendations/Changes made from today's visit: -Recommend adding Ozempic 0.25 mg weekly, titrate to 0.5 mg after 4 weeks (see phone note) -Advised pt return to torsemide 20 mg/day and contact HF clinic for f/u appt  Plan -[place] will call patient 2 weeks to f/u med changes -Pharmacist follow up televisit scheduled for 1 month   Subjective: ANANI GU is an 66 y.o. year old female who is a primary patient of Damita Dunnings, Elveria Rising, MD.  The CCM team was consulted for assistance with disease management and care coordination needs.    Engaged with patient by telephone for follow up visit in response to provider referral for pharmacy case management and/or care coordination services.   Consent to Services:  The patient was given information about Chronic Care Management services, agreed to services, and gave verbal consent prior to initiation of services.  Please see initial visit note for detailed documentation.   Patient Care Team: [name], MD as PCP - General (Family Medicine) Minna Merritts, MD as PCP - Cardiology (Cardiology) Deboraha Sprang, MD as PCP - Electrophysiology (Cardiology) Bensimhon, Shaune Pascal, MD as PCP - Advanced Heart Failure (Cardiology) Calvert Cantor, MD as Consulting Physician (Ophthalmology) Lavonia Dana, MD as Consulting Physician (Internal Medicine) Elvina Mattes, Adele Schilder (Inactive) as Attending Physician (Podiatry) Tsosie Billing, MD as Consulting Physician (Infectious Diseases) Alda Berthold, DO as Consulting Physician (Neurology) [name], [facility] as Pharmacist (Pharmacist)   Patient has  been living with her mother since her toe amputation so she can help with dressing changes.   Recent office visits: 09/24/21 Dr Damita Dunnings VV (telephone): advised chekcing pre and post-meal sugars. Rec CGM. Labs ordered (CMP, CBC, A1C, lipids)  02/15/21 Damita Dunnings (PCP) [facility] f/u. Osteomyelitis. Reduce Gabapentin 600 mg HS & start Novolog sliding scale. Advised to restart torsemide 20 mg daily PRN leg swelling. Recheck BMP/K in 1 week.  Recent consult visits: 07/23/21 Dr Posey [place] (neurology): f/u neuropathy; reduce gabapentin to 300 mg HS due to renal function;   Following with podiatry (Dr Jacqualyn Posey) and wound care for L great toe amputation  05/21/21 NP Allena Katz (HF clinic): restart torsemide 20 mg daily. Hold off on SGLT2i until renal function results. K 5.2, avoid K-rich foods and repeat BMP 7 days.  04/02/21 12/25/20 Koll[place] (Nephrology) - Chronic kidney disease, stage 4. F/u 3 mos. 03/25/21 Caryl Comes (Cardiology) - Nonischemic cardiomyopathy. EKG 12-lead. No med changes. 12/25/20 Kolluru (Nephrology) - F/u Chronic kidney disease, Stage IV. Start Jardiance 10 mg. D/c Cacitriol.  F/u 3 mos.  Hospital visits: Medication Reconciliation was completed by comparing discharge summary, patients EMR and Pharmacy list, and upon discussion with patient.  Admitted to the ED on 10/12/21 due to CHF exacerbation. Discharge date was 10/13/21. Discharged from [facility].   -IV lasix in ED  Medication Changes at Hospital Discharge: -Changed torsemide to 40 mg daily x 5 days, then back to 20 mg  Medications that remain the same after Hospital Discharge:??  -All other medications will remain the same.    Admitted 02/2021 for cellulitis. She underwent amputation of left great toe 5/27. Repeat arterial studies 5/27 showed a 50-59%  stenosis on the distal left popliteal stent segment, no new interventions per vascular. ID was consulted and recommended 6 weeks of abx. Tunneled central line placed (PICC avoided  due to CKD) and she was discharged on Daptomycin  Admitted 01/2021 for osteomyelitis of left foot after failing outpatient management. Had initial surgery (I&D, bone biopsy, and sesamoidectomy). Vascular was consulted after it was felt she was high risk for limb loss due to dampened monophasic signals in left foot on ABI. She underwent CO2 angiogram with angioplasty of 3 separate in-stent stenoses in the left superficial femoral artery, and then revision of surgery (bone resection of 5th metatarsal head and base of the prox phalanx of the 5th). Torsemide held at discharge due to worsening renal function.  Objective:  Lab Results  Component Value Date   CREATININE 1.87 (H) 10/12/2021   BUN 52 (H) 10/12/2021   GFR 28.39 (L) 10/03/2021   GFRNONAA 29 (L) 10/12/2021   GFRAA 23 (L) 11/05/2019   NA 136 10/12/2021   K 4.4 10/12/2021   CALCIUM 10.2 10/12/2021   CO2 27 10/12/2021   GLUCOSE 173 (H) 10/12/2021    Lab Results  Component Value Date/Time   HGBA1C 8.1 (H) 10/03/2021 11:37 AM   HGBA1C 6.8 (H) 04/23/2021 12:23 PM   FRUCTOSAMINE 305 (H) 12/24/2018 05:53 PM   GFR 28.39 (L) 10/03/2021 11:37 AM   GFR 26.41 (L) 02/15/2021 03:29 PM   MICROALBUR 6.9 08/06/2016 02:32 PM   MICROALBUR 10.0 12/12/2015 03:48 PM    Last diabetic Eye exam: No results found for: HMDIABEYEEXA  Last diabetic Foot exam: No results found for: HMDIABFOOTEX   Lab Results  Component Value Date   CHOL 96 10/03/2021   HDL 38.90 (L) 10/03/2021   LDLCALC 43 10/03/2021   TRIG 70.0 10/03/2021   CHOLHDL 2 10/03/2021    Hepatic Function Latest Ref Rng & Units 10/03/2021 03/03/2021 02/26/2021  Total Protein 6.0 - 8.3 g/dL 6.4 6.1(L) 7.2  Albumin 3.5 - 5.2 g/dL 3.2(L) 3.0(L) 3.5  AST 0 - 37 U/L 10 16 17   ALT 0 - 35 U/L 7 14 15   Alk Phosphatase 39 - 117 U/L 82 55 64  Total Bilirubin 0.2 - 1.2 mg/dL 0.3 0.4 0.7  Bilirubin, Direct 0.00 - 0.40 mg/dL - - -    Lab Results  Component Value Date/Time   TSH 1.160 03/27/2021  12:47 PM   TSH 1.400 09/26/2020 12:16 PM   FREET4 1.19 03/22/2019 11:59 AM   FREET4 1.1 04/16/2016 04:50 PM    CBC Latest Ref Rng & Units 10/12/2021 10/03/2021 04/25/2021  WBC 4.0 - 10.5 K/uL 8.0 8.8 7.5  Hemoglobin 12.0 - 15.0 g/dL 9.4(L) 9.1(L) 12.1  Hematocrit 36.0 - 46.0 % 31.0(L) 28.4(L) 38.1  Platelets 150 - 400 K/uL 309 257.0 185    Lab Results  Component Value Date/Time   VD25OH 32 06/01/2012 10:00 AM   CHA2DS2-VASc Score = 8  The patient's score is based upon: CHF History: 1 HTN History: 1 Diabetes History: 1 Stroke History: 2 Vascular Disease History: 1 Age Score: 1 Gender Score: 1     Clinical ASCVD: Yes  The ASCVD Risk score (Arnett DK, et al., 2019) failed to calculate for the following reasons:   The patient has a prior MI or stroke diagnosis    Depression screen [facility] 2/9 03/30/2021 03/21/2021 09/28/2020  Decreased Interest 0 0 0  Down, Depressed, Hopeless - 0 1  PHQ - 2 Score 0 0 1  Altered sleeping - 0  0  Tired, decreased energy - 0 3  Change in appetite - 0 3  Feeling bad or failure about yourself  - 0 0  Trouble concentrating - 0 0  Moving slowly or fidgety/restless - 0 0  Suicidal thoughts - 0 0  PHQ-9 Score - 0 7  Difficult doing work/chores - Not difficult at all Not difficult at all  Some recent data might be hidden    Social History   Tobacco Use  Smoking Status Never  Smokeless Tobacco Never   BP Readings from Last 3 Encounters:  10/13/21 123/90  09/24/21 (!) 147/80  07/23/21 105/62   Pulse Readings from Last 3 Encounters:  10/13/21 68  09/24/21 65  07/23/21 (!) 55   Wt Readings from Last 3 Encounters:  09/24/21 280 lb (127 kg)  07/23/21 273 lb (123.8 kg)  05/21/21 277 lb 9.6 oz (125.9 kg)   BMI Readings from Last 3 Encounters:  09/24/21 42.57 kg/m  07/23/21 41.51 kg/m  05/21/21 42.21 kg/m    Assessment/Interventions: Review of patient past medical history, allergies, medications, health status, including review of  consultants reports, laboratory and other test data, was performed as part of comprehensive evaluation and provision of chronic care management services.   SDOH:  (Social Determinants of Health) assessments and interventions performed: Yes  SDOH Screenings   Alcohol Screen: Low Risk    Last Alcohol Screening Score (AUDIT): 0  Depression (PHQ2-9): Low Risk    PHQ-2 Score: 0  Financial Resource Strain: Low Risk    Difficulty of Paying Living Expenses: Not hard at all  Food Insecurity: No Food Insecurity   Worried About Charity fundraiser in the Last Year: Never true   Ran Out of Food in the Last Year: Never true  Housing: Low Risk    Last Housing Risk Score: 0  Physical Activity: Inactive   Days of Exercise per Week: 0 days   Minutes of Exercise per Session: 0 min  Social Connections: Not on file  Stress: No Stress Concern Present   Feeling of Stress : Not at all  Tobacco Use: Low Risk    Smoking Tobacco Use: Never   Smokeless Tobacco Use: Never   Passive Exposure: Not on file  Transportation Needs: No Transportation Needs   Lack of Transportation (Medical): No   Lack of Transportation (Non-Medical): No    CCM Care Plan  Allergies  Allergen Reactions   Heparin     Can not remember reaction   Nsaids     Kidney disease    Medications Reviewed Today     Reviewed by [name], [facility] (Pharmacist) on 10/17/21 at 1551  Med List Status: <None>   Medication Order Taking? Sig Documenting Provider Last Dose Status Informant  acetaminophen (TYLENOL) 500 MG tablet [id number] Yes Take 1,500 mg by mouth every 6 (six) hours as needed for moderate pain or headache. [provider] Taking Active Self  amiodarone (PACERONE) 200 MG tablet [id number] Yes TAKE 1 TABLET BY MOUTH ONCE DAILY  Patient taking differently: Take 200 mg by mouth daily.   Bensimhon, Shaune Pascal, MD Taking Active Self  aspirin EC 81 MG EC tablet [id number] Yes Take 1 tablet (81 mg total) by mouth daily.  Swallow whole. Nita Sells, MD Taking Active Self  atorvastatin (LIPITOR) 10 MG tablet [id number] Yes Take 1 tablet (10 mg total) by mouth daily.  Patient taking differently: Take 10 mg by mouth at bedtime.   Geradine Girt, DO Taking Active  Self  calcitRIOL (ROCALTROL) 0.5 MCG capsule [id number] Yes Take 0.5 mcg by mouth every morning. [provider] Taking Active Self  Calcium Carb-Cholecalciferol (CALCIUM CARBONATE-VITAMIN D3) 600-400 MG-UNIT TABS [id number] Yes Take 1 tablet by mouth 2 (two) times a day. [provider] Taking Active Self  carvedilol (COREG) 3.125 MG tablet [id number] Yes Take 3.125 mg by mouth in the morning and at bedtime. [provider] Taking Active Self  clopidogrel (PLAVIX) 75 MG tablet [id number] Yes Take 1 tablet (75 mg total) by mouth daily. NO FURTHER REFILLS UNTIL SEEN IN CLINIC, PLEASE CALL OFFICE TO SCHEDULE AN APPOINTMENT.  Patient taking differently: Take 75 mg by mouth at bedtime.   Minna Merritts, MD Taking Active Self  Continuous Blood Gluc Receiver (FREESTYLE LIBRE 2 READER) [place] [id number] Yes Use to check sugar multiple times per day as directed.  H/o DM2, treated with insulin. [name], MD Taking Active Self  Continuous Blood Gluc Sensor (FREESTYLE LIBRE 2 SENSOR) [place] [id number] Yes Use to check sugar multiple times per day as directed.  H/o DM2, treated with insulin. [name], MD Taking Active Self  DULoxetine (CYMBALTA) 30 MG capsule [id number] Yes Take 1 capsule (30 mg total) by mouth at bedtime. Alda Berthold, DO Taking Active Self  ferrous sulfate 325 (65 FE) MG tablet [id number] Yes Take 325 mg by mouth 2 (two) times daily with a meal. [provider] Taking Active Self  gabapentin (NEURONTIN) 600 MG tablet [id number] Yes Take 36m at bedtime.  Patient taking differently: Take 1,200 mg by mouth at bedtime. Take 3047mat bedtime.   PaAlda BertholdDO Taking Active Self  Insulin Aspart  FlexPen (NOVOLOG) 100 UNIT/ML [id number] INJECT 0.05 ML (5 UNITS TOTAL) INTO THE SKIN 3 TIMES DAILY BEFORE MEALS  Patient taking differently: Inject 5 Units into the skin in the morning, at noon, and at bedtime.   DuTonia GhentMD Taking Active Self  Insulin Pen Needle (PEN NEEDLES) 32G X 6 MM MISC [id number] 1 each by Does not apply route 4 (four) times daily. SaJacelyn PiIrLilia ArgueMD Taking Active Self  isosorbide mononitrate (IMDUR) 30 MG 24 hr tablet [id number] TAKE 1 TABLET BY MOUTH ONCE DAILY  Patient taking differently: Take 30 mg by mouth daily.   Bensimhon, DaShaune PascalMD Taking Active Self  LANTUS SOLOSTAR 100 UNIT/ML Solostar Pen [id number] INJECT 22 UNITS UNDER THE SKIN DAILY.  Patient taking differently: Inject 22 Units into the skin at bedtime.   DuTonia GhentMD Taking Active Self           Med Note (FCharlton Haws Fri Jun 29, 2021 10:14 AM)    levothyroxine (SYNTHROID) 50 MCG tablet [id number] Take 1 tablet (50 mcg total) by mouth daily before breakfast. DuTonia GhentMD Taking Active Self  losartan (COZAAR) 25 MG tablet [id number] TAKE 1/2 TABLET BY MOUTH DAILY  Patient taking differently: Take 12.5 mg by mouth daily.   Bensimhon, DaShaune PascalMD Taking Active Self  Multiple Vitamin (MULTIVITAMIN WITH MINERALS) TABS tablet [id number] Take 1 tablet by mouth every morning. [provider] Taking Active Self  nitroGLYCERIN (NITROSTAT) 0.4 MG SL tablet [id number] Place 1 tablet (0.4 mg total) under the tongue every 5 (five) minutes as needed for chest pain. KlDeboraha SprangMD Taking Active Self  Omega-3 Fatty Acids (FISH OIL) 1000 MG CAPS [id number] Take 1,000 mg by mouth 2 (two)  times a day. [provider] Taking Active Self  torsemide (DEMADEX) 20 MG tablet [id number]  Take 1 tablet (20 mg total) by mouth daily. Allena Katz [place], [place]  Expired 10/13/21 2359 Self  traMADol (ULTRAM) 50 MG tablet [id number] Yes TAKE 1 TO 2  TABLET BY MOUTH AT BEDTIME AS NEEDED  Patient taking differently: Take 100 mg by mouth at bedtime.   [name], MD Taking Active Self            Patient Active Problem List   Diagnosis Date Noted   Localized osteoarthritis of knees, bilateral 07/25/2021   Medication monitoring encounter 03/31/2021   Cellulitis of left foot 02/27/2021   Cellulitis 02/27/2021   Osteomyelitis ([place]) 02/18/2021   CKD (chronic kidney disease) stage 4, GFR 15-29 ml/min (HCC) 01/28/2021   AICD (automatic cardioverter/defibrillator) present 01/28/2021   Diabetic foot ulcer ([place]) 11/24/2020   Advance care planning 10/04/2020   Secondary hyperparathyroidism of renal origin ([place]) 07/20/2019   Hyperkalemia 07/20/2019   Benign hypertensive kidney disease with chronic kidney disease 07/20/2019   Anemia in chronic kidney disease 07/20/2019   Positive antinuclear antibody 07/20/2019   Proteinuria 07/20/2019   Acute on chronic systolic (congestive) heart failure ([place]) 03/23/2019   UTI (urinary tract infection) 03/09/2019   CAD (coronary artery disease) 03/09/2019   DM type 2, uncontrolled, with renal complications 90/30/0923   Atrial tachycardia ([place])    Acute kidney injury superimposed on chronic kidney disease ([place])    AKI (acute kidney injury) ([place])    Weakness generalized 12/22/2018   Shortness of breath 12/22/2018   Palliative care encounter 12/22/2018   Endocarditis 12/14/2018   NSTEMI (non-ST elevated myocardial infarction) ([place]) 11/22/2018   Acute on chronic combined systolic and diastolic CHF (congestive heart failure) ([place]) 10/28/2018   Lymphedema 10/28/2018   Severe sepsis ([place]) 09/28/2018   History of amputation of lesser toe of right foot ([place]) 04/19/2018   Presence of cardiac resynchronization therapy defibrillator (CRT-D) 04/19/2018   Paraparesis of both lower limbs ([place]) 03/29/2018   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   Paroxysmal atrial fibrillation ([place]) 03/29/2018   Class 3  obesity due to excess calories with serious comorbidity and body mass index (BMI) of 40.0 to 44.9 in adult 09/02/2016   History of CVA (cerebrovascular accident) 11/11/2015   Chronic renal insufficiency 08/04/2015   OSA (obstructive sleep apnea) 08/15/2014   Morbid obesity ([place]) 10/26/2013   Essential hypertension, benign 04/29/2013   Pure hypercholesterolemia 12/07/2012   Iron deficiency anemia 12/07/2012   Diabetic peripheral neuropathy associated with type 2 diabetes mellitus ([place]) 30/04/6225   Chronic systolic congestive heart failure ([place]) 07/29/2012   Left bundle-branch block 07/25/2012   Coronary artery disease involving native coronary artery of native heart without angina pectoris 07/25/2012   Heart failure ([place]) 07/21/2012   Hyperlipidemia 07/21/2012    Immunization History  Administered Date(s) Administered   Fluad Quad(high Dose 65+) 10/03/2021   Hepatitis B 03/16/2013   Hepatitis B, adult 03/30/2014, 07/18/2014   Influenza Split 07/21/2012, 07/03/2020   Influenza, High Dose Seasonal PF 07/01/2019   Influenza,inj,Quad PF,6+ Mos 06/16/2013, 07/07/2014, 06/18/2015, 08/06/2016, 07/20/2018   Influenza-Unspecified 07/23/2017, 07/22/2018, 06/11/2019   PFIZER Comirnaty(Gray Top)Covid-19 Tri-Sucrose Vaccine 06/21/2021   PFIZER(Purple Top)SARS-COV-2 Vaccination 11/21/2019, 12/21/2019, 07/19/2020   PNEUMOCOCCAL CONJUGATE-20 06/21/2021   Pneumococcal Polysaccharide-23 08/08/2011, 06/18/2015, 08/13/2016   Tdap 03/07/2010, 05/03/2016   Zoster, Live 04/06/2014    Conditions to be addressed/monitored:  Hypertension, Hyperlipidemia, Diabetes, Atrial Fibrillation, Heart Failure, Coronary Artery Disease, and Chronic Kidney  Disease  Care Plan : CCM Pharmacy Care Plan  Updates made by [name], RPH since 10/17/2021 12:00 AM     Problem: Hypertension, Hyperlipidemia, Diabetes, Atrial Fibrillation, Heart Failure, Coronary Artery Disease, and Chronic Kidney Disease    Priority: High     Long-Range Goal: Disease management   Start Date: 06/29/2021  Expected End Date: 06/29/2022  This Visit's Progress: Not on track  Recent Progress: On track  Priority: High  Note:   Current Barriers:  Unable to independently monitor therapeutic efficacy Unable to maintain control of DM, HF  Pharmacist Clinical Goal(s):  Patient will achieve adherence to monitoring guidelines and medication adherence to achieve therapeutic efficacy adhere to plan to optimize therapeutic regimen for DM, HF as evidenced by report of adherence to recommended medication management changes through collaboration with PharmD and provider.   Interventions: 1:1 collaboration with [name], MD regarding development and update of comprehensive plan of care as evidenced by provider attestation and co-signature Inter-disciplinary care team collaboration (see longitudinal plan of care) Comprehensive medication review performed; medication list updated in electronic medical record  Hyperlipidemia: (LDL goal < 70) -Controlled - LDL is a goal; pt reports compliance with medications as prescribed; she endorses some bleeding issues - when she nicks herself it takes longer to stop bleeding, she denies blood in urine/stool -Hx CAD - MI 07/2012 w/ stent; Hx stroke -Current treatment: Atorvastatin 10 mg daily - Appropriate, Effective, Safe, Accessible Isosorbide MN 30 mg daily - Appropriate, Effective, Safe, Accessible Nitroglycerin 0.4 mg SL prn - Appropriate, Effective, Safe, Accessible Clopidogrel 75 mg daily HS - Appropriate, Effective, Safe, Accessible Aspirin 81 mg daily - Appropriate, Effective, Safe, Accessible -Educated on Cholesterol goals; Benefits of statin for ASCVD risk reduction; -Counseled on benefits of clopidogrel/aspirin for stent patency; discussed nuisance bleeding vs major bleeding; counseled on signs/symptoms of major bleeding and when to seek emergency care -Recommended to  continue current medication  Diabetes (A1c goal <7%) -Uncontrolled, worsening- A1c is above goal; pt endorses compliance with insulin regimen as prescribed; she reports she typically takes Novolog twice a day since she eats 2 meals per day - usually ~5 units in AM, 6-7 units with PM meal -Pt reports rare hypoglycemia; she uses peanut butter for hypoglycemic episodes -Hx toe amputation, osteomyelitis -Pt started using Freestyle Libre ~2 weeks ago and provides following readings -7-day avg glucose 198 Before  After  Before  After   Bedtime breakfast Breakfast Supper  Supper 1/1 103  264      154   1/2 142  305  186  225  179 1/3 188  320  288  188  188 1/4 144    288    315 1/5 174  303  256  298  228 1/6 167  202 1/7 240  266  260  254  233 1/8 144    130  254  226 1/9 164  343  288  382  249 1/10 111  242  237  193  148 1/11 110  202  170  308 -Current medications: Lantus 22 units daily HS Novolog 5-10 units TID per sliding scale - usually BID Testing supplies -Medications previously tried: Trulicity, glimepiride, Jardiance, metformin, Januvia -Fasting BG are slightly elevated, post-prandial BG after breakfast and supper are much higher, would recommend targeting post-prandial sugars with a GLP-1 agonist to limit risk for hypoglycemia -Recommend starting Ozempic 0.25 mg weekly, titrate up to 0.5 mg after 4 weeks  Atrial [place]/NSVT/AFIB (  Goal: prevent stroke and major bleeding) -Controlled - pt reports palpitations have improved since being on amiodarone -Clarified Afib dx previously - pt had 1 hr of SCAF on device, no repeat events; cardiology has not recommended anticoagulation -CHADSVASC: 8; per chart review patient has never been on anticoagulation for Afib (was briefly on Eliquis for a DVT in 2016); she is currently on DAPT for hx CAD w/ stent -Current treatment: Rate control: Amiodarone 200 mg daily, carvedilol 3.125 mg AM Anticoagulation: none -Medications previously  tried: Eliquis (for DVT, 2016) -Counseled on benefits of rate/rhythm control drugs to prevent tachycardia -Recommended to continue current medication  Heart Failure (Goal: manage symptoms and prevent exacerbations) -Not ideally controlled - pt was seen in the ED last week for excess swelling/HF exacerbation and given IV lasix; she increased her torsemide to 3 tablets and reports leg swelling is back to normal -Current home BP/HR readings: 139/80, HR high 50s-low 60s -Last ejection fraction: 50-55% (Date: 03/07/20) -HF type: Diastolic; NYHA Class: I-II -Current treatment: Carvedilol 3.125 mg BID Isosorbide MN 30 mg daily Losartan 25 mg - 1/2 tab daily Torsemide 20 mg daily -Medications previously tried: Jardiance 10 mg -Educated on Benefits of medications for managing symptoms and prolonging life; Importance of blood pressure control -Discussed SGLT2i (Jardiance) - given comorbidities (CKD, HF, DM) data supports use of SGLT2i; pt does have recent amputation of toe and SGLT2i have historically been linked to higher amputation risk, usually in patients with A1c >8; given recency of amputation it is reasonable to hold off restarting Jardiance, but ultimately benefits of SGLT2 likely outweigh risk in this patient -Advised pt return to torsemide 20 mg/day and call HF clinic for f/u appt  CKD stage 4 / HyperPTH (Goal: prevent progression) -Worsening - most recent GFR is worse (36 > 28 from Aug to Dec 2022), now in Stage 4 CKD -Current treatment  Calcitriol 0.5 mg daily AM Ferrous sulfate 325 mg BID Losartan 25 mg - 1/2 tab daily -Counseled on maintaining adequate hydration; importance of BP and DM control to prevent CKD progression; avoidance of NSAIDs and other nephrotoxins -Recommended to continue current medication  Health Maintenance -Vaccine gaps: Shingrix, covid booster, flu -Advised to get Flu vaccine as soon as able  Patient Goals/Self-Care Activities Patient will:  - take  medications as prescribed -focus on medication adherence by pill box -check glucose daily, document, and provide at future appointments -check blood pressure daily, document, and provide at future appointments -Email Freestyle report to PharmD -Call HF clinic (Dr Haroldine Laws) for f/u appt     Medication Assistance: None required.  Patient affirms current coverage meets needs.  Compliance/Adherence/Medication fill history: Care Gaps: DEXA Eye exam (04/09/19) Foot exam (07/21/19)  Star-Rating Drugs: Atorvastatin - LF 09/07/21 x 30 ds; PDC 63% Losartan - LF 09/07/21 x 90 ds; [place] 100%  Patient's preferred pharmacy is:  [facility] ([place]) [place], [place] [address] Phone: [phone] Fax: [phone]  [place], [place] [address] Phone: [phone] Fax: [phone]  [facility] [address] [place] [address] Phone: [phone] Fax: [phone]   Uses pill box? Yes Pt endorses 100% compliance  We discussed: Benefits of medication synchronization, packaging and delivery as well as enhanced pharmacist oversight with [employer]. Patient decided to: Continue current medication management strategy  Care Plan and Follow Up Patient Decision:  Patient agrees to Care  Plan and Follow-up.  Plan: Telephone follow up appointment with care management team member scheduled for:  1 month  Charlene Brooke, PharmD, [facility] Clinical Pharmacist [facility] [phone]

## 2021-10-18 NOTE — Telephone Encounter (Signed)
Spoke with patient, she agreed to start Ozempic as prescribed. She will let us know if she has any issues at the pharmacy. Will call patient in 4 weeks to assess tolerability/increase dose.

## 2021-10-19 NOTE — Telephone Encounter (Signed)
Great.  Thanks

## 2021-10-24 ENCOUNTER — Other Ambulatory Visit: Payer: Self-pay | Admitting: Family Medicine

## 2021-10-31 ENCOUNTER — Telehealth: Payer: Self-pay

## 2021-10-31 NOTE — Progress Notes (Addendum)
° ° °  Chronic Care Management Pharmacy Assistant   Name: [name]  MRN: [id number] DOB: [birth year]  Reason for Encounter: CCM (Blood Glucose Log)   Call patient in regards to her blood glucose log and her Ozempic adherence/tolerability as patient started Ozempic as a new medication on 10/17/2021.  Patient started her Ozempic injections on 10/22/2021. She takes her injections on Monday in the morning. Patient took her second injection on 10/29/2021. Patient denies any side effects. She stated she could not tell a difference on the medication.   Date  AM 2 HR LATER LUNCH 2 HRS LATER  BEDTIME 01/25  158 Called patient in morning 01/24  178 225  236  242   213 01/23  166 234  154  242   285 01/22  202 246  225  -----   236 01/21  210 ----  219  260   258  Patient is concerned that her numbers are still running high. Patient states she eats balanced meals and watches her salt intake. Patient only drinks water with Crystal Light packets in it. Patient is also concerned that her knee pain may be driving up her numbers. Patient has been waiting for [facility] to order her a knee brace which they did on Friday. Patient has not received it yet. Patient stated her pain level most of the time is 10/10 and never falls below 5/10. Patient said [facility] did not give her anything for pain; only to take Tylenol.   I inquired about patients regimen for diabetes. Patient states she takes 0.25 of Ozempic injection on Monday mornings. Patient stated she takes 22 units of Lantus at night around 11:00 pm. Patient gave me the following breakdown of how she takes her Novolog depending on her readings.  6 units = glucose is 150-199 7 units = glucose is 200-249 8 units = glucose is 250-299 9 units = glucose is 300-349 10 units = glucose is 350+  Charlene Brooke, CPP notified  Marijean Niemann, [place] Clinical Pharmacy Assistant [phone]  Pharmacist addendum: It is not abnormal for there to be little  change in BG with starting dose of Ozempic, normally we do not see improvements until increasing to 0.5 mg. Pt has f/u appt with PharmD in February to continue dose titration, will defer changes until then.  Charlene Brooke, PharmD, BCACP Clinical Pharmacist [facility] at [facility] [phone]

## 2021-10-31 NOTE — Telephone Encounter (Signed)
The patient needed a new monitor ordered. I told her she should receive it in 7-10 business days.

## 2021-11-04 LAB — CUP PACEART REMOTE DEVICE CHECK
Battery Remaining Longevity: 59 mo
Battery Voltage: 2.98 V
Brady Statistic AP VP Percent: 1.6 %
Brady Statistic AP VS Percent: 0.02 %
Brady Statistic AS VP Percent: 98.31 %
Brady Statistic AS VS Percent: 0.07 %
Brady Statistic RA Percent Paced: 1.62 %
Brady Statistic RV Percent Paced: 99.78 %
Date Time Interrogation Session: 20230128103915
HighPow Impedance: 65 Ohm
Implantable Lead Implant Date: 20200625
Implantable Lead Implant Date: 20200625
Implantable Lead Implant Date: 20200625
Implantable Lead Location: 753858
Implantable Lead Location: 753859
Implantable Lead Location: 753860
Implantable Lead Model: 4398
Implantable Lead Model: 5076
Implantable Lead Model: 6935
Implantable Pulse Generator Implant Date: 20200625
Lead Channel Impedance Value: 1007 Ohm
Lead Channel Impedance Value: 1026 Ohm
Lead Channel Impedance Value: 178.125
Lead Channel Impedance Value: 183.214
Lead Channel Impedance Value: 194.043
Lead Channel Impedance Value: 266.667
Lead Channel Impedance Value: 278.237
Lead Channel Impedance Value: 285 Ohm
Lead Channel Impedance Value: 361 Ohm
Lead Channel Impedance Value: 361 Ohm
Lead Channel Impedance Value: 456 Ohm
Lead Channel Impedance Value: 475 Ohm
Lead Channel Impedance Value: 513 Ohm
Lead Channel Impedance Value: 608 Ohm
Lead Channel Impedance Value: 608 Ohm
Lead Channel Impedance Value: 703 Ohm
Lead Channel Impedance Value: 703 Ohm
Lead Channel Impedance Value: 836 Ohm
Lead Channel Pacing Threshold Amplitude: 0.625 V
Lead Channel Pacing Threshold Amplitude: 0.625 V
Lead Channel Pacing Threshold Amplitude: 2.25 V
Lead Channel Pacing Threshold Pulse Width: 0.4 ms
Lead Channel Pacing Threshold Pulse Width: 0.4 ms
Lead Channel Pacing Threshold Pulse Width: 0.8 ms
Lead Channel Sensing Intrinsic Amplitude: 1 mV
Lead Channel Sensing Intrinsic Amplitude: 1 mV
Lead Channel Sensing Intrinsic Amplitude: 19.75 mV
Lead Channel Sensing Intrinsic Amplitude: 19.75 mV
Lead Channel Setting Pacing Amplitude: 2 V
Lead Channel Setting Pacing Amplitude: 2 V
Lead Channel Setting Pacing Amplitude: 2.5 V
Lead Channel Setting Pacing Pulse Width: 0.4 ms
Lead Channel Setting Pacing Pulse Width: 0.8 ms
Lead Channel Setting Sensing Sensitivity: 0.3 mV

## 2021-11-05 ENCOUNTER — Ambulatory Visit (INDEPENDENT_AMBULATORY_CARE_PROVIDER_SITE_OTHER): Payer: HMO

## 2021-11-05 DIAGNOSIS — I428 Other cardiomyopathies: Secondary | ICD-10-CM | POA: Diagnosis not present

## 2021-11-05 LAB — CUP PACEART REMOTE DEVICE CHECK
Battery Remaining Longevity: 59 mo
Battery Voltage: 2.98 V
Brady Statistic AP VP Percent: 0.13 %
Brady Statistic AP VS Percent: 0.02 %
Brady Statistic AS VP Percent: 99.8 %
Brady Statistic AS VS Percent: 0.05 %
Brady Statistic RA Percent Paced: 0.15 %
Brady Statistic RV Percent Paced: 99.86 %
Date Time Interrogation Session: 20230130033322
HighPow Impedance: 63 Ohm
Implantable Lead Implant Date: 20200625
Implantable Lead Implant Date: 20200625
Implantable Lead Implant Date: 20200625
Implantable Lead Location: 753858
Implantable Lead Location: 753859
Implantable Lead Location: 753860
Implantable Lead Model: 4398
Implantable Lead Model: 5076
Implantable Lead Model: 6935
Implantable Pulse Generator Implant Date: 20200625
Lead Channel Impedance Value: 1007 Ohm
Lead Channel Impedance Value: 183.214
Lead Channel Impedance Value: 183.214
Lead Channel Impedance Value: 192.065
Lead Channel Impedance Value: 274.19 Ohm
Lead Channel Impedance Value: 274.19 Ohm
Lead Channel Impedance Value: 285 Ohm
Lead Channel Impedance Value: 361 Ohm
Lead Channel Impedance Value: 361 Ohm
Lead Channel Impedance Value: 456 Ohm
Lead Channel Impedance Value: 513 Ohm
Lead Channel Impedance Value: 513 Ohm
Lead Channel Impedance Value: 589 Ohm
Lead Channel Impedance Value: 608 Ohm
Lead Channel Impedance Value: 703 Ohm
Lead Channel Impedance Value: 722 Ohm
Lead Channel Impedance Value: 836 Ohm
Lead Channel Impedance Value: 988 Ohm
Lead Channel Pacing Threshold Amplitude: 0.625 V
Lead Channel Pacing Threshold Amplitude: 0.625 V
Lead Channel Pacing Threshold Amplitude: 2.375 V
Lead Channel Pacing Threshold Pulse Width: 0.4 ms
Lead Channel Pacing Threshold Pulse Width: 0.4 ms
Lead Channel Pacing Threshold Pulse Width: 0.8 ms
Lead Channel Sensing Intrinsic Amplitude: 1 mV
Lead Channel Sensing Intrinsic Amplitude: 1 mV
Lead Channel Sensing Intrinsic Amplitude: 19.375 mV
Lead Channel Sensing Intrinsic Amplitude: 19.375 mV
Lead Channel Setting Pacing Amplitude: 2 V
Lead Channel Setting Pacing Amplitude: 2 V
Lead Channel Setting Pacing Amplitude: 2.5 V
Lead Channel Setting Pacing Pulse Width: 0.4 ms
Lead Channel Setting Pacing Pulse Width: 0.8 ms
Lead Channel Setting Sensing Sensitivity: 0.3 mV

## 2021-11-06 DIAGNOSIS — E7800 Pure hypercholesterolemia, unspecified: Secondary | ICD-10-CM

## 2021-11-06 DIAGNOSIS — N184 Chronic kidney disease, stage 4 (severe): Secondary | ICD-10-CM

## 2021-11-06 DIAGNOSIS — I5022 Chronic systolic (congestive) heart failure: Secondary | ICD-10-CM

## 2021-11-06 DIAGNOSIS — I10 Essential (primary) hypertension: Secondary | ICD-10-CM | POA: Diagnosis not present

## 2021-11-06 DIAGNOSIS — I2510 Atherosclerotic heart disease of native coronary artery without angina pectoris: Secondary | ICD-10-CM

## 2021-11-07 ENCOUNTER — Telehealth: Payer: Self-pay | Admitting: Internal Medicine

## 2021-11-07 NOTE — Telephone Encounter (Signed)
Vickie Sprang, MD  11/05/2021  3:02 PM EST     Remote reviewed. This remote is abnormal for ? LV threshold elevation She was missed for recall 12/22 can we have her come in please [facility]

## 2021-11-07 NOTE — Telephone Encounter (Signed)
Attempted to call the patient. No answer- I left a message to please call back to discuss the message below.  The patient is currently scheduled for a follow up appointment on 12/11/21. I advised on the message I left on her machine that we need to move this up to sooner.  Will await a call back.

## 2021-11-07 NOTE — Telephone Encounter (Signed)
I spoke with the patient regarding Dr. Olin Pia message/ recommendations as stated below. I advised her we should see her sooner than 12/11/21.  The patient voices understanding and is agreeable.  I have advised the patient that we could see her on 11/20/21 at 8:20 am. The patient advised she is having knee problems and has to have someone drive her so she will try to make it that early. She will call if there is an issue.  I have advised her we will call her if we have a cancellation for a later appointment. She declined [place].   I have also sent a message to scheduling to see if one of our later patient's would be willing to come in at 8:20 so the patient may come later.

## 2021-11-08 ENCOUNTER — Encounter (HOSPITAL_COMMUNITY): Payer: HMO

## 2021-11-09 DIAGNOSIS — G4733 Obstructive sleep apnea (adult) (pediatric): Secondary | ICD-10-CM | POA: Diagnosis not present

## 2021-11-09 DIAGNOSIS — M86171 Other acute osteomyelitis, right ankle and foot: Secondary | ICD-10-CM | POA: Diagnosis not present

## 2021-11-09 DIAGNOSIS — I5022 Chronic systolic (congestive) heart failure: Secondary | ICD-10-CM | POA: Diagnosis not present

## 2021-11-09 DIAGNOSIS — J189 Pneumonia, unspecified organism: Secondary | ICD-10-CM | POA: Diagnosis not present

## 2021-11-12 ENCOUNTER — Other Ambulatory Visit: Payer: Self-pay | Admitting: Family Medicine

## 2021-11-12 NOTE — Progress Notes (Signed)
Advanced Heart Failure Clinic Progress Note     Primary Physician [name], MD Primary Cardiologist: Ida Rogue, MD  Electrophysiologist: Virl Axe, MD   Advanced HF: Dr. Haroldine Laws  Sleep Clinic: Dr. Radford Pax   Reason for Visit/CC: f/u for chronic systolic HF  HPI:  [name] is a 66 y.o. 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   Admitted on 6/20 with A/C biventricular HF. EF improved previously with CRT but back down again 10-15% (6/20) after CRT removal at [facility] due to sepsis with TV endocarditis. Placed on milrinone which was later weaned off. Unfortunately while hospitalized her husband had CVA and passed away on 2023/04/06. On 04/06/19 she underwent CRT-D reimplantation and was discharged home.   She has made a steady recovery with CRT. Echo 6/21 EF 50-55%   Saw Dr. Caryl Comes 12/21   She was seen in the ED 10/02/20. Felt to have URI. No COVID.   Admitted 2/22 for cellulitis, with MRI findings showing osteomyelitis of the left foot. Underwent I&D and debridement with podiatry. Incidentally found to be COVID+, asymptomatic. She was discharged on reduced torsemide dose (20 mg bid) and losartan was stopped this admission due to worsening renal function.  Admitted 4/22 for osteomyelitis of left foot after failing outpatient management. Had initial surgery (I&D, bone biopsy, and sesamoidectomy). Vascular was consulted and she underwent CO2 angiogram with angioplasty of 3 separate in-stent stenoses in the left superficial femoral artery, and then revision of surgery (bone resection of 5th metatarsal head and base of the prox phalanx of the 5th). Torsemide held at discharge due to worsening renal function.  Admitted 5/22 for cellulitis. She underwent amputation of left great toe. Repeat arterial studies showed  a 50-59% stenosis on the distal left popliteal stent segment, no new interventions per vascular. ID was consulted and recommended 6 weeks of abx. Tunneled central line placed (PICC avoided due to CKD) and she was discharged on Daptomycin.   She has had several hospitalizations for non-healing foot ulcer, ultimately requiring amputation and IV abx for osteomyelitis (see above).   Seen in ED 10/13/21 for LEE, given a dose of IV lasix and torsemide increased to 40 mg daily x 5 days.  Today she returns for HF follow up. Main issue is left knee pain and she is considering a TKR. She has also noticed legs are cooler and experiencing some numbness in them. She is able to get around the house and do some ADLs, but limited mostly by pain. Breathing is OK. Has some LE swelling, occasionally takes extra torsemide. Denies palpitations, abnormal bleeding, CP, dizziness, or PND/Orthopnea. Appetite ok. No fever or chills. Unable to stand for weights at home. Taking all medications.  No leg wounds.  Cardiac Studies  - Echo (6/20): EF 10-15%, RV severely reduced, moderately enlarged, RVSP 45 mmHG, moderate RAE, moderate MR, mod/severe TR.  - RHC (6/20):    Most Recent Value  Fick Cardiac Output 5.37 L/min  Fick Cardiac Output Index 2.24 (L/min)/BSA  RA A Wave 16 mmHg  RA V Wave 18 mmHg  RA Mean 16 mmHg  RV Systolic Pressure 57 mmHg  RV Diastolic Pressure 8 mmHg  RV EDP 20 mmHg  PA Systolic Pressure 57 mmHg  PA Diastolic Pressure 28 mmHg  PA Mean 40 mmHg  PW A  Wave 27 mmHg  PW V Wave 36 mmHg  PW Mean 27 mmHg  QP/QS 1  TPVR Index 17.85 HRUI   Current Meds  Medication Sig   acetaminophen (TYLENOL) 500 MG tablet Take 1,500 mg by mouth every 6 (six) hours as needed for moderate pain or headache.   amiodarone (PACERONE) 200 MG tablet TAKE 1 TABLET BY MOUTH ONCE DAILY (Patient taking differently: Take 200 mg by mouth daily.)   aspirin EC 81 MG EC tablet Take 1 tablet (81 mg total) by mouth daily. Swallow  whole.   atorvastatin (LIPITOR) 10 MG tablet Take 1 tablet (10 mg total) by mouth daily. (Patient taking differently: Take 10 mg by mouth at bedtime.)   calcitRIOL (ROCALTROL) 0.5 MCG capsule Take 0.5 mcg by mouth every morning.   Calcium Carb-Cholecalciferol (CALCIUM CARBONATE-VITAMIN D3) 600-400 MG-UNIT TABS Take 1 tablet by mouth 2 (two) times a day.   carvedilol (COREG) 3.125 MG tablet Take 3.125 mg by mouth in the morning and at bedtime.   clopidogrel (PLAVIX) 75 MG tablet Take 1 tablet (75 mg total) by mouth daily. NO FURTHER REFILLS UNTIL SEEN IN CLINIC, PLEASE CALL OFFICE TO SCHEDULE AN APPOINTMENT. (Patient taking differently: Take 75 mg by mouth at bedtime.)   Continuous Blood Gluc Receiver (FREESTYLE LIBRE 2 READER) DEVI Use to check sugar multiple times per day as directed.  H/o DM2, treated with insulin.   Continuous Blood Gluc Sensor (FREESTYLE LIBRE 2 SENSOR) MISC Use to check sugar multiple times per day as directed.  H/o DM2, treated with insulin.   DULoxetine (CYMBALTA) 30 MG capsule Take 1 capsule (30 mg total) by mouth at bedtime.   ferrous sulfate 325 (65 FE) MG tablet Take 325 mg by mouth 2 (two) times daily with a meal.   gabapentin (NEURONTIN) 600 MG tablet Take 300mg  at bedtime. (Patient taking differently: Take 1,200 mg by mouth at bedtime. Take 300mg  at bedtime.)   Insulin Aspart FlexPen (NOVOLOG) 100 UNIT/ML INJECT 0.05 ML (5 UNITS TOTAL) INTO THE SKIN 3 TIMES DAILY BEFORE MEALS (Patient taking differently: Inject 5 Units into the skin in the morning, at noon, and at bedtime.)   Insulin Pen Needle (PEN NEEDLES) 32G X 6 MM MISC 1 each by Does not apply route 4 (four) times daily.   isosorbide mononitrate (IMDUR) 30 MG 24 hr tablet TAKE 1 TABLET BY MOUTH ONCE DAILY (Patient taking differently: Take 30 mg by mouth daily.)   LANTUS SOLOSTAR 100 UNIT/ML Solostar Pen INJECT 22 UNITS UNDER THE SKIN DAILY.   levothyroxine (SYNTHROID) 50 MCG tablet Take 1 tablet (50 mcg total) by  mouth daily before breakfast.   losartan (COZAAR) 25 MG tablet TAKE 1/2 TABLET BY MOUTH DAILY (Patient taking differently: Take 12.5 mg by mouth daily.)   Multiple Vitamin (MULTIVITAMIN WITH MINERALS) TABS tablet Take 1 tablet by mouth every morning.   nitroGLYCERIN (NITROSTAT) 0.4 MG SL tablet Place 1 tablet (0.4 mg total) under the tongue every 5 (five) minutes as needed for chest pain.   Omega-3 Fatty Acids (FISH OIL) 1000 MG CAPS Take 1,000 mg by mouth 2 (two) times a day.   Semaglutide,0.25 or 0.5MG /DOS, (OZEMPIC, 0.25 OR 0.5 MG/DOSE,) 2 MG/1.5ML SOPN Inject 0.25 mg into the skin once a week.   traMADol (ULTRAM) 50 MG tablet TAKE 1 TO 2 TABLET BY MOUTH AT BEDTIME AS NEEDED   Allergies  Allergen Reactions   Heparin     Can not remember reaction   Nsaids  Kidney disease   Past Medical History:  Diagnosis Date   Acute myocardial infarction, subendocardial infarction, initial episode of care ([place]) 07/21/2012   Acute osteomyelitis involving ankle and foot ([place]) 27/03/2375   Acute systolic heart failure ([place]) 07/21/2012   New onset 07/19/12; admission to [facility] ED. Elevated Troponins.  S/p 2D-echo with EF 20-25%.  S/p cardiac catheterization with stenting LAD.  Repeat 2D-echo 10/2011 with improved EF of 35%.    Anemia    Automatic implantable cardioverter-defibrillator in situ    a. MDT CRT-D 06/2014, SN: [id number]  .removed in feb 2020   CAD (coronary artery disease)    a. cardiac cath 101/04/2012: PCI/DES to chronically occluded mLAD, consideration PCI to diag branch in 4 weeks.    Cataract    Chicken pox    Chronic kidney disease    Chronic systolic CHF (congestive heart failure) (HCC)    a. mixed ICM & NICM; b. EF 20-25% by echo 07/2012, mid-dist 2/3 of LV sev HK/AK, mild MR. echo 10/2012: EF 30-35%, sev HK ant-septal & inf walls, GR1DD, mild MR, PASP 33. c. echo 02/2013: EF 30%, GR1DD, mild MR. echo 04/2014: EF 30%, Septal-lat dyssynchrony, global HK, inf AK, GR1DD, mild MR. d.  echo 10/2014: EF50-55%, WM nl, GR1DD, septal mild paradox. e. echo 02/2015: EF 50-55%, wm    Depression    Heart attack ([place])    Heel ulcer ([place]) 04/27/2015   History of blood transfusion ~ 2011   "plasma; had neuropathy; couldn't walk"   Hypertension    Hypothyroidism    LBBB (left bundle branch block)    Neuromuscular disorder ([place])    Neuropathy 2011   Obesity, unspecified    OSA on CPAP    Moderate with AHI 23/hr and now on CPAP at 16cm H2O.  Her DME is [facility]   Pure hypercholesterolemia    Sepsis ([place]) 09/2018   Type II diabetes mellitus ([place])    Unspecified vitamin D deficiency    Family History  Problem Relation Age of Onset   Diabetes Mother    Hypertension Mother    Arthritis Mother        knees, lumbar DDD, cervical DDD   Cancer Father        prostate,skin,lymphoma.   Cancer Brother 79       bladder cancer; non-smoker   Diabetes Maternal Grandmother    Heart disease Maternal Grandmother    COPD Maternal Grandmother    Diabetes Paternal Grandmother    Obesity Brother    Diabetes Son    Hypertension Son    Cancer Maternal Grandfather    Diabetes Paternal Grandfather    Past Surgical History:  Procedure Laterality Date   ABDOMINAL AORTOGRAM W/LOWER EXTREMITY N/A 02/02/2021   Procedure: ABDOMINAL AORTOGRAM W/LOWER EXTREMITY;  Surgeon: Angelia Mould, MD;  Location: [place] [facility];  Service: Cardiovascular;  Laterality: N/A;   AMPUTATION Left 03/02/2021   Procedure: AMPUTATION RAY, left great toe;  Surgeon: Trula Slade, DPM;  Location: [place];  Service: Podiatry;  Laterality: Left;   AMPUTATION TOE Right 06/18/2015   Procedure: AMPUTATION TOE;  Surgeon: Samara Deist, DPM;  Location: [facility];  Service: Podiatry;  Laterality: Right;   AMPUTATION TOE Left 10/08/2018   Procedure: AMPUTATION TOE LEFT 2ND;  Surgeon: Albertine Patricia, DPM;  Location: [facility];  Service: Podiatry;  Laterality: Left;   BACK SURGERY     BI-VENTRICULAR IMPLANTABLE CARDIOVERTER  DEFIBRILLATOR N/A 07/06/2014   Procedure: BI-VENTRICULAR IMPLANTABLE CARDIOVERTER DEFIBRILLATOR  (  CRT-D);  Surgeon: Deboraha Sprang, MD;  Location: [facility] CATH LAB;  Service: Cardiovascular;  Laterality: N/A;   BI-VENTRICULAR IMPLANTABLE CARDIOVERTER DEFIBRILLATOR  (CRT-D)  07/06/2014   BILATERAL OOPHORECTOMY  01/2011   ovarian cyst benign   BIV ICD INSERTION CRT-D N/A 03/31/2019   Procedure: BIV ICD INSERTION CRT-D;  Surgeon: Constance Haw, MD;  Location: [place] [facility];  Service: Cardiovascular;  Laterality: N/A;   COLONOSCOPY WITH PROPOFOL Left 02/22/2015   Procedure: COLONOSCOPY WITH PROPOFOL;  Surgeon: Hulen Luster, MD;  Location: [facility] ENDOSCOPY;  Service: Endoscopy;  Laterality: Left;   CORONARY ANGIOPLASTY WITH STENT PLACEMENT Left 07/2012   new onset systolic CHF; elevated troponins.  Cardiac catheterization with stenting to LAD; EF 15%.  2D-echo: EF 20-25%.   ESOPHAGOGASTRODUODENOSCOPY N/A 02/22/2015   Procedure: ESOPHAGOGASTRODUODENOSCOPY (EGD);  Surgeon: Hulen Luster, MD;  Location: [facility] ENDOSCOPY;  Service: Endoscopy;  Laterality: N/A;   I & D EXTREMITY Left 11/28/2020   Procedure: IRRIGATION AND DEBRIDEMENT OF FOOT;  Surgeon: Trula Slade, DPM;  Location: [place];  Service: Podiatry;  Laterality: Left;   I & D EXTREMITY Left 01/31/2021   Procedure: IRRIGATION AND DEBRIDEMENT EXTREMITY OF LEFT FOOT WITH BONE BIOPSY OF  1ST METATARSAL AND FIFTH METATARSAL;  Surgeon: Felipa Furnace, DPM;  Location: [place];  Service: Podiatry;  Laterality: Left;   I & D EXTREMITY Left 02/03/2021   Procedure: IRRIGATION AND DEBRIDEMENT EXTREMITY WITH INTERNAL AMPUTATION FIRST AND FIFTH RAY;  Surgeon: Felipa Furnace, DPM;  Location: [place];  Service: Podiatry;  Laterality: Left;   INCISION AND DRAINAGE ABSCESS Right 2007   groin; with ICU stay due to sepsis.   IR FLUORO GUIDE CV LINE RIGHT  03/06/2021   IR REMOVAL TUN CV CATH W/O FL  04/27/2021   IR US GUIDE VASC ACCESS RIGHT  03/06/2021   IRRIGATION AND  DEBRIDEMENT FOOT Left 04/25/2021   Procedure: EXCISION AND DEBRIDEMENT WOUND ULCER LEFT FOOT; METATARSECTOMY;  Surgeon: Trula Slade, DPM;  Location: [facility];  Service: Podiatry;  Laterality: Left;   LAPAROSCOPIC CHOLECYSTECTOMY  2011   LEFT HEART CATH AND CORONARY ANGIOGRAPHY N/A 10/05/2018   Procedure: LEFT HEART CATH AND CORONARY ANGIOGRAPHY;  Surgeon: Minna Merritts, MD;  Location: [place] [facility];  Service: Cardiovascular;  Laterality: N/A;   LEFT HEART CATHETERIZATION WITH CORONARY ANGIOGRAM N/A 07/21/2012   Procedure: LEFT HEART CATHETERIZATION WITH CORONARY ANGIOGRAM;  Surgeon: Jolaine Artist, MD;  Location: [facility] CATH LAB;  Service: Cardiovascular;  Laterality: N/A;   LUMBAR [place]   L4-5   PACEMAKER REMOVAL  11/2018   due to infection around pacemaker   [place] (PCI-S) N/A 07/23/2012   Procedure: PERCUTANEOUS CORONARY STENT INTERVENTION (PCI-S);  Surgeon: Sherren Mocha, MD;  Location: [facility] CATH LAB;  Service: Cardiovascular;  Laterality: N/A;   PERIPHERAL VASCULAR BALLOON ANGIOPLASTY Left 10/06/2018   Procedure: PERIPHERAL VASCULAR BALLOON ANGIOPLASTY;  Surgeon: Katha Cabal, MD;  Location: [place] [facility];  Service: Cardiovascular;  Laterality: Left;   PERIPHERAL VASCULAR CATHETERIZATION N/A 02/10/2015   Procedure: Picc Line Insertion;  Surgeon: Katha Cabal, MD;  Location: [place] [facility];  Service: Cardiovascular;  Laterality: N/A;   RIGHT HEART CATH N/A 03/29/2019   Procedure: RIGHT HEART CATH;  Surgeon: Jolaine Artist, MD;  Location: [place] [facility];  Service: Cardiovascular;  Laterality: N/A;   TEE WITHOUT CARDIOVERSION N/A 10/02/2018   Procedure: TRANSESOPHAGEAL ECHOCARDIOGRAM (TEE);  Surgeon:  Wellington Hampshire, MD;  Location: [facility];  Service: Cardiovascular;  Laterality: N/A;   TUBAL LIGATION  1981   VAGINAL HYSTERECTOMY  01/2011   Fibroids/DUB.  Ovaries removed. [place].   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  Tobacco Use   Smoking status: Never   Smokeless tobacco: Never  Vaping Use   Vaping Use: Never used  Substance and Sexual Activity   Alcohol use: No    Alcohol/week: 0.0 standard drinks   Drug use: No   Sexual activity: Not Currently    Birth control/protection: Post-menopausal, Surgical  Other Topics Concern   Not on file  Social History Narrative   Widowed, husband died when she was in surgery.        Children:  2 children (daughter, son). Two grandsons and 2 step grandchildren.      Lives: with husband, daughter, son-in-law, 2 grandsons, and 1 on the way.      Employment: disability for peripheral neuropathy 2012.  Previously had home daycare.   Social Determinants of Health   Financial Resource Strain: Low Risk    Difficulty of Paying Living Expenses: Not hard at all  Food Insecurity: No Food Insecurity   Worried About Charity fundraiser in the Last Year: Never true   [place] in the Last Year: Never true  Transportation Needs: No Transportation Needs   Lack of Transportation (Medical): No   Lack of Transportation (Non-Medical): No  Physical Activity: Inactive   Days of Exercise per Week: 0 days   Minutes of Exercise per Session: 0 min  Stress: No Stress Concern Present   Feeling of Stress : Not at all  Social Connections: Not on file  Intimate Partner Violence: Not At Risk   Fear of Current or Ex-Partner: No   Emotionally Abused: No   Physically Abused: No   Sexually Abused: No    Lipid Panel     Component Value Date/Time   CHOL 96 10/03/2021 1137   CHOL 128 09/26/2020 1216   TRIG 70.0 10/03/2021 1137   HDL 38.90  (L) 10/03/2021 1137   HDL 44 09/26/2020 1216   CHOLHDL 2 10/03/2021 1137   VLDL 14.0 10/03/2021 1137   LDLCALC 43 10/03/2021 1137   LDLCALC 60 09/26/2020 1216   Wt Readings from Last 3 Encounters:  09/24/21 127 kg (280 lb)  07/23/21 123.8 kg (273 lb)  05/21/21 125.9 kg (277 lb 9.6 oz)   BP 130/70    Pulse 69    Ht 5\' 8"  (1.727 m)    SpO2 96%    BMI 42.57 kg/m   Physical Exam General:  NAD. No resp difficulty, arrived in [facility] HEENT: Normal Neck: Supple. No JVD. Carotids 2+ bilat; no bruits. No lymphadenopathy or thryomegaly appreciated. Cor: PMI nondisplaced. Regular rate & rhythm. No rubs, gallops or murmurs. Lungs: Clear Abdomen: Obese, nontender, nondistended. No hepatosplenomegaly. No bruits or masses. Good bowel sounds. Extremities: No cyanosis, clubbing, rash, 1+ BLE edema Neuro: Alert & oriented x 3, cranial nerves grossly intact. Moves all 4 extremities w/o difficulty. Affect pleasant.  ECG: BiV pacing (personally reviewed).  Optivol: OptiVol down, thoracic impedence stable, no AF, >80% BiV-pacing, no VT/VF, daily activity <1 hr (personally reviewed).  ASSESSMENT AND PLAN:   1. Chronic Systolic HF/ Mixed Ischemic/NICM w/ LBBB:  - Echo 6/20 EF 10-15%.(after extraction of CRT) - Echo 12/20 EF 35% s/p MDT CRT-D re-implant (initally removed due to sepsis).  - Echo 12/21 EF 50-55% (normalized with CRT-D) - NYHA III, limited mostly by knee pain. Volume looks ok on exam and by device, mild LE edema today. - Elevate legs at home. - Continue torsemide 20 mg daily. Can take extra 20 PRN swelling. - Continue carvedilol 3.125 mg bid. - Continue losartan 12.5 mg daily. - Continue Imdur 30 mg daily. - Off Jardiance with CKD (Nephrologist stopped) - No Entresto or spiro with CKD IV. - Update echo. - BMET and BNP today.  2. CKD, Stage IV:  - Followed by nephrology (Dr. Rolly Salter [facility]). - Baseline creatinine 2.0-2.5. - Labs today.  3. PAT & NSVT, Frequent PVCs:  -  Suppressed on amiodarone 200 mg daily. - LFTS (12/22) and TSH (6/22) ok. - Denies palpitations. Followed by Dr. Caryl Comes.  4. Hypothyroidism:  - On synthroid.  5. CAD:   - s/p prior MI>>LAD DES.  - No s/s angina. - On Plavix and statin.   6. OSA:  - Compliant w/ CPAP using nasal device and chin strap.  7. DM2: - Managed by PCP. - A1c 6.8 (7/22). - Experiencing peripheral neuropathy.  8. PVD: - s/p angioplasty and stenting of LLE 2019 - s/p CO2 ateriogram w/ angioplasty of 3 separate in-stent stenoses in left femoral artery 2022. - No new LE wounds.  - She is worried about her legs being cold to touch. - We discussed repeating ABI, but will refer to Vascular, she has seen Dr. Scot Dock - Continue ASA + statin.  9. Pre-op cardiac risk stratification: - She has significant knee OA affecting her QOL and mobility and is considering TKR. - Discussed with Dr. Haroldine Laws, she is low to moderate risk to TKR.  Follow up with Dr. Jeffie Pollock in 3-4 months.  [place], FNP  11/14/21

## 2021-11-12 NOTE — Telephone Encounter (Signed)
Refill request for Tramadol 50 mg tablets  LOV - 09/24/21 Next OV - not scheduled Last refill - 09/16/21 #[phone]

## 2021-11-13 NOTE — Telephone Encounter (Signed)
Rx sent.  Please schedule DM2 f/u for March at [facility].  Thanks.

## 2021-11-13 NOTE — Progress Notes (Signed)
Remote ICD transmission.   

## 2021-11-14 ENCOUNTER — Other Ambulatory Visit: Payer: Self-pay

## 2021-11-14 ENCOUNTER — Ambulatory Visit (HOSPITAL_COMMUNITY)
Admission: RE | Admit: 2021-11-14 | Discharge: 2021-11-14 | Disposition: A | Discharge status: Home / Self Care | Payer: HMO | Source: Ambulatory Visit | Attending: Family Medicine | Admitting: Family Medicine

## 2021-11-14 ENCOUNTER — Other Ambulatory Visit: Payer: Self-pay | Admitting: Family Medicine

## 2021-11-14 ENCOUNTER — Telehealth: Payer: Self-pay

## 2021-11-14 ENCOUNTER — Encounter (HOSPITAL_COMMUNITY): Payer: Self-pay

## 2021-11-14 VITALS — BP 130/70 | HR 69 | Ht 68.0 in

## 2021-11-14 DIAGNOSIS — E039 Hypothyroidism, unspecified: Secondary | ICD-10-CM | POA: Insufficient documentation

## 2021-11-14 DIAGNOSIS — N184 Chronic kidney disease, stage 4 (severe): Secondary | ICD-10-CM | POA: Insufficient documentation

## 2021-11-14 DIAGNOSIS — I252 Old myocardial infarction: Secondary | ICD-10-CM | POA: Diagnosis not present

## 2021-11-14 DIAGNOSIS — I472 Ventricular tachycardia, unspecified: Secondary | ICD-10-CM | POA: Insufficient documentation

## 2021-11-14 DIAGNOSIS — I130 Hypertensive heart and chronic kidney disease with heart failure and stage 1 through stage 4 chronic kidney disease, or unspecified chronic kidney disease: Secondary | ICD-10-CM | POA: Insufficient documentation

## 2021-11-14 DIAGNOSIS — Z7984 Long term (current) use of oral hypoglycemic drugs: Secondary | ICD-10-CM | POA: Insufficient documentation

## 2021-11-14 DIAGNOSIS — E1151 Type 2 diabetes mellitus with diabetic peripheral angiopathy without gangrene: Secondary | ICD-10-CM | POA: Insufficient documentation

## 2021-11-14 DIAGNOSIS — I428 Other cardiomyopathies: Secondary | ICD-10-CM | POA: Diagnosis not present

## 2021-11-14 DIAGNOSIS — Z955 Presence of coronary angioplasty implant and graft: Secondary | ICD-10-CM | POA: Diagnosis not present

## 2021-11-14 DIAGNOSIS — I493 Ventricular premature depolarization: Secondary | ICD-10-CM | POA: Diagnosis not present

## 2021-11-14 DIAGNOSIS — Z8616 Personal history of COVID-19: Secondary | ICD-10-CM | POA: Diagnosis not present

## 2021-11-14 DIAGNOSIS — I2510 Atherosclerotic heart disease of native coronary artery without angina pectoris: Secondary | ICD-10-CM | POA: Insufficient documentation

## 2021-11-14 DIAGNOSIS — I447 Left bundle-branch block, unspecified: Secondary | ICD-10-CM | POA: Insufficient documentation

## 2021-11-14 DIAGNOSIS — M179 Osteoarthritis of knee, unspecified: Secondary | ICD-10-CM | POA: Insufficient documentation

## 2021-11-14 DIAGNOSIS — Z79890 Hormone replacement therapy (postmenopausal): Secondary | ICD-10-CM | POA: Insufficient documentation

## 2021-11-14 DIAGNOSIS — G4733 Obstructive sleep apnea (adult) (pediatric): Secondary | ICD-10-CM | POA: Diagnosis not present

## 2021-11-14 DIAGNOSIS — E1122 Type 2 diabetes mellitus with diabetic chronic kidney disease: Secondary | ICD-10-CM | POA: Diagnosis not present

## 2021-11-14 DIAGNOSIS — E1142 Type 2 diabetes mellitus with diabetic polyneuropathy: Secondary | ICD-10-CM | POA: Diagnosis not present

## 2021-11-14 DIAGNOSIS — I739 Peripheral vascular disease, unspecified: Secondary | ICD-10-CM | POA: Diagnosis not present

## 2021-11-14 DIAGNOSIS — Z7902 Long term (current) use of antithrombotics/antiplatelets: Secondary | ICD-10-CM | POA: Diagnosis not present

## 2021-11-14 DIAGNOSIS — Z79899 Other long term (current) drug therapy: Secondary | ICD-10-CM | POA: Diagnosis not present

## 2021-11-14 DIAGNOSIS — I5022 Chronic systolic (congestive) heart failure: Secondary | ICD-10-CM | POA: Insufficient documentation

## 2021-11-14 DIAGNOSIS — Z01810 Encounter for preprocedural cardiovascular examination: Secondary | ICD-10-CM | POA: Diagnosis not present

## 2021-11-14 LAB — BASIC METABOLIC PANEL
Anion gap: 13 (ref 5–15)
BUN: 30 mg/dL — ABNORMAL HIGH (ref 8–23)
CO2: 28 mmol/L (ref 22–32)
Calcium: 11 mg/dL — ABNORMAL HIGH (ref 8.9–10.3)
Chloride: 96 mmol/L — ABNORMAL LOW (ref 98–111)
Creatinine, Ser: 2.15 mg/dL — ABNORMAL HIGH (ref 0.44–1.00)
GFR, Estimated: 25 mL/min — ABNORMAL LOW (ref >60)
Glucose, Bld: 126 mg/dL — ABNORMAL HIGH (ref 70–99)
Potassium: 4.2 mmol/L (ref 3.5–5.1)
Sodium: 137 mmol/L (ref 135–145)

## 2021-11-14 LAB — BRAIN NATRIURETIC PEPTIDE: B Natriuretic Peptide: 308.5 pg/mL — ABNORMAL HIGH (ref 0.0–100.0)

## 2021-11-14 NOTE — Progress Notes (Signed)
Chronic Care Management Pharmacy Assistant   Name: [name]  MRN: [id number] DOB: [birth year]  Reason for Encounter: CCM (Appointment Reminder)  Medications: Outpatient Encounter Medications as of 11/14/2021  Medication Sig   acetaminophen (TYLENOL) 500 MG tablet Take 1,500 mg by mouth every 6 (six) hours as needed for moderate pain or headache.   amiodarone (PACERONE) 200 MG tablet TAKE 1 TABLET BY MOUTH ONCE DAILY (Patient taking differently: Take 200 mg by mouth daily.)   aspirin EC 81 MG EC tablet Take 1 tablet (81 mg total) by mouth daily. Swallow whole.   atorvastatin (LIPITOR) 10 MG tablet Take 1 tablet (10 mg total) by mouth daily. (Patient taking differently: Take 10 mg by mouth at bedtime.)   calcitRIOL (ROCALTROL) 0.5 MCG capsule Take 0.5 mcg by mouth every morning.   Calcium Carb-Cholecalciferol (CALCIUM CARBONATE-VITAMIN D3) 600-400 MG-UNIT TABS Take 1 tablet by mouth 2 (two) times a day.   carvedilol (COREG) 3.125 MG tablet Take 3.125 mg by mouth in the morning and at bedtime.   clopidogrel (PLAVIX) 75 MG tablet Take 1 tablet (75 mg total) by mouth daily. NO FURTHER REFILLS UNTIL SEEN IN CLINIC, PLEASE CALL OFFICE TO SCHEDULE AN APPOINTMENT. (Patient taking differently: Take 75 mg by mouth at bedtime.)   Continuous Blood Gluc Receiver (FREESTYLE LIBRE 2 READER) DEVI Use to check sugar multiple times per day as directed.  H/o DM2, treated with insulin.   Continuous Blood Gluc Sensor (FREESTYLE LIBRE 2 SENSOR) MISC Use to check sugar multiple times per day as directed.  H/o DM2, treated with insulin.   DULoxetine (CYMBALTA) 30 MG capsule Take 1 capsule (30 mg total) by mouth at bedtime.   ferrous sulfate 325 (65 FE) MG tablet Take 325 mg by mouth 2 (two) times daily with a meal.   gabapentin (NEURONTIN) 600 MG tablet Take 300mg  at bedtime. (Patient taking differently: Take 1,200 mg by mouth at bedtime. Take 300mg  at bedtime.)   Insulin Aspart FlexPen (NOVOLOG) 100 UNIT/ML  INJECT 0.05 ML (5 UNITS TOTAL) INTO THE SKIN 3 TIMES DAILY BEFORE MEALS (Patient taking differently: Inject 5 Units into the skin in the morning, at noon, and at bedtime.)   Insulin Pen Needle (PEN NEEDLES) 32G X 6 MM MISC 1 each by Does not apply route 4 (four) times daily.   isosorbide mononitrate (IMDUR) 30 MG 24 hr tablet TAKE 1 TABLET BY MOUTH ONCE DAILY (Patient taking differently: Take 30 mg by mouth daily.)   LANTUS SOLOSTAR 100 UNIT/ML Solostar Pen INJECT 22 UNITS UNDER THE SKIN DAILY.   levothyroxine (SYNTHROID) 50 MCG tablet Take 1 tablet (50 mcg total) by mouth daily before breakfast.   losartan (COZAAR) 25 MG tablet TAKE 1/2 TABLET BY MOUTH DAILY (Patient taking differently: Take 12.5 mg by mouth daily.)   Multiple Vitamin (MULTIVITAMIN WITH MINERALS) TABS tablet Take 1 tablet by mouth every morning.   nitroGLYCERIN (NITROSTAT) 0.4 MG SL tablet Place 1 tablet (0.4 mg total) under the tongue every 5 (five) minutes as needed for chest pain.   Omega-3 Fatty Acids (FISH OIL) 1000 MG CAPS Take 1,000 mg by mouth 2 (two) times a day.   Semaglutide,0.25 or 0.5MG /DOS, (OZEMPIC, 0.25 OR 0.5 MG/DOSE,) 2 MG/1.5ML SOPN Inject 0.25 mg into the skin once a week.   torsemide (DEMADEX) 20 MG tablet Take 1 tablet (20 mg total) by mouth daily.   traMADol (ULTRAM) 50 MG tablet TAKE 1 TO 2 TABLET BY MOUTH AT BEDTIME AS NEEDED  No facility-administered encounter medications on file as of 11/14/2021.   [name] was contacted to remind of upcoming telephone visit with [name] on 11/19/2021 at 3:30 pm. Patient was reminded to have all medications, supplements and any blood glucose and blood pressure readings available for review at appointment. If unable to reach, a voicemail was left for patient.   Are you having any problems with your medications? No   Do you have any concerns you like to discuss with the pharmacist? No  Star Rating Drugs: Medication:   Last Fill: Day Supply Ozempic  0.25 or 0.5 mg 10/18/2021 28 Losartan 25 mg  09/07/2021 90 Atorvastatin 10 mg  09/07/2021 30 Fill dates verified with [place], CPP notified  Marijean Niemann, [place]  Time Spent: 10 Minutes

## 2021-11-14 NOTE — Patient Instructions (Signed)
It was great to see you today! No medication changes are needed at this time.   Labs today We will only contact you if something comes back abnormal or we need to make some changes. Otherwise no news is good news!  Your physician has requested that you have an echocardiogram. Echocardiography is a painless test that uses sound waves to create images of your heart. It provides your doctor with information about the size and shape of your heart and how well your hearts chambers and valves are working. This procedure takes approximately one hour. There are no restrictions for this procedure.  Your physician has requested that you have an ankle brachial index (ABI). During this test an ultrasound and blood pressure cuff are used to evaluate the arteries that supply the arms and legs with blood. Allow thirty minutes for this exam. There are no restrictions or special instructions.  Your physician recommends that you schedule a follow-up appointment in: 3-4 months with Dr Haroldine Laws  Do the following things EVERYDAY: Weigh yourself in the morning before breakfast. Write it down and keep it in a log. Take your medicines as prescribed Eat low salt foods--Limit salt (sodium) to 2000 mg per day.  Stay as active as you can everyday Limit all fluids for the day to less than 2 liters  At the [facility], you and your health needs are our priority. As part of our continuing mission to provide you with exceptional heart care, we have created designated Provider Care Teams. These Care Teams include your primary Cardiologist (physician) and Advanced Practice Providers (APPs- Physician Assistants and Nurse Practitioners) who all work together to provide you with the care you need, when you need it.   You may see any of the following providers on your designated Care Team at your next follow up: Dr Glori Bickers Dr Haynes Kerns, NP Lyda Jester, [place] [facility] [place], [place] Audry Riles, PharmD   Please be sure to bring in all your medications bottles to every appointment.   If you have any questions or concerns before your next appointment please send [place] a message through [place] or call our office at [phone].    TO LEAVE A MESSAGE FOR THE NURSE SELECT OPTION 2, PLEASE LEAVE A MESSAGE INCLUDING: YOUR NAME DATE OF BIRTH CALL BACK NUMBER REASON FOR CALL**this is important as we prioritize the call backs  YOU WILL RECEIVE A CALL BACK THE SAME DAY AS LONG AS YOU CALL BEFORE 4:00 PM

## 2021-11-19 ENCOUNTER — Telehealth: Payer: HMO

## 2021-11-19 ENCOUNTER — Telehealth: Payer: Self-pay | Admitting: Pharmacist

## 2021-11-19 NOTE — Progress Notes (Unsigned)
Chronic Care Management Pharmacy Note  11/19/2021 Name:  [name] MRN:  [id number] DOB:  Jul 26, 1956  Summary: -Pt has been wearing Freestyle Libre. She reports 7-day avg 198. See assessment for full BG log.  -Pt has been taking torsemide 60 mg/day since ED visit 1/6 and reports leg swelling is back to normal  Recommendations/Changes made from today's visit: -Recommend adding Ozempic 0.25 mg weekly, titrate to 0.5 mg after 4 weeks (see phone note) -Advised pt return to torsemide 20 mg/day and contact HF clinic for f/u appt  Plan -Jagual will call patient 2 weeks to f/u med changes -Pharmacist follow up televisit scheduled for 1 month   Subjective: [name] is an 66 y.o. year old female who is a primary patient of Damita Dunnings, Elveria Rising, MD.  The CCM team was consulted for assistance with disease management and care coordination needs.    Engaged with patient by telephone for follow up visit in response to provider referral for pharmacy case management and/or care coordination services.   Consent to Services:  The patient was given information about Chronic Care Management services, agreed to services, and gave verbal consent prior to initiation of services.  Please see initial visit note for detailed documentation.   Patient Care Team: [name], MD as PCP - General (Family Medicine) Minna Merritts, MD as PCP - Cardiology (Cardiology) Deboraha Sprang, MD as PCP - Electrophysiology (Cardiology) Bensimhon, Shaune Pascal, MD as PCP - Advanced Heart Failure (Cardiology) Calvert Cantor, MD as Consulting Physician (Ophthalmology) Lavonia Dana, MD as Consulting Physician (Internal Medicine) Elvina Mattes, Adele Schilder (Inactive) as Attending Physician (Podiatry) Tsosie Billing, MD as Consulting Physician (Infectious Diseases) Alda Berthold, DO as Consulting Physician (Neurology) [name], [facility] as Pharmacist (Pharmacist)   Patient has  been living with her mother since her toe amputation so she can help with dressing changes.   Recent office visits: 09/24/21 Dr Damita Dunnings VV (telephone): advised chekcing pre and post-meal sugars. Rec CGM. Labs ordered (CMP, CBC, A1C, lipids)  02/15/21 Damita Dunnings (PCP) [facility] f/u. Osteomyelitis. Reduce Gabapentin 600 mg HS & start Novolog sliding scale. Advised to restart torsemide 20 mg daily PRN leg swelling. Recheck BMP/K in 1 week.  Recent consult visits: 11/14/21 NP Allena Katz (HF clinic): f/u HF. Labs stable. Ordered ABI, ECHO. No med changes.   07/23/21 Dr Posey Pronto (neurology): f/u neuropathy; reduce gabapentin to 300 mg HS due to renal function;   Following with podiatry (Dr Jacqualyn Posey) and wound care for L great toe amputation  05/21/21 NP Allena Katz (HF clinic): restart torsemide 20 mg daily. Hold off on SGLT2i until renal function results. K 5.2, avoid K-rich foods and repeat BMP 7 days.  04/02/21 12/25/20 Kolluru (Nephrology) - Chronic kidney disease, stage 4. F/u 3 mos. 03/25/21 Caryl Comes (Cardiology) - Nonischemic cardiomyopathy. EKG 12-lead. No med changes. 12/25/20 Kolluru (Nephrology) - F/u Chronic kidney disease, Stage IV. Start Jardiance 10 mg. D/c Cacitriol.  F/u 3 mos.  Hospital visits: Medication Reconciliation was completed by comparing discharge summary, patients EMR and Pharmacy list, and upon discussion with patient.  Admitted to the ED on 10/12/21 due to CHF exacerbation. Discharge date was 10/13/21. Discharged from [facility].   -IV lasix in ED  Medication Changes at Hospital Discharge: -Changed torsemide to 40 mg daily x 5 days, then back to 20 mg  Medications that remain the same after Hospital Discharge:??  -All other medications will remain the same.    Admitted  02/2021 for cellulitis. She underwent amputation of left great toe 5/27. Repeat arterial studies 5/27 showed a 50-59% stenosis on the distal left popliteal stent segment, no new interventions per  vascular. ID was consulted and recommended 6 weeks of abx. Tunneled central line placed (PICC avoided due to CKD) and she was discharged on Daptomycin  Admitted 01/2021 for osteomyelitis of left foot after failing outpatient management. Had initial surgery (I&D, bone biopsy, and sesamoidectomy). Vascular was consulted after it was felt she was high risk for limb loss due to dampened monophasic signals in left foot on ABI. She underwent CO2 angiogram with angioplasty of 3 separate in-stent stenoses in the left superficial femoral artery, and then revision of surgery (bone resection of 5th metatarsal head and base of the prox phalanx of the 5th). Torsemide held at discharge due to worsening renal function.  Objective:  Lab Results  Component Value Date   CREATININE 2.15 (H) 11/14/2021   BUN 30 (H) 11/14/2021   GFR 28.39 (L) 10/03/2021   GFRNONAA 25 (L) 11/14/2021   GFRAA 23 (L) 11/05/2019   NA 137 11/14/2021   K 4.2 11/14/2021   CALCIUM 11.0 (H) 11/14/2021   CO2 28 11/14/2021   GLUCOSE 126 (H) 11/14/2021    Lab Results  Component Value Date/Time   HGBA1C 8.1 (H) 10/03/2021 11:37 AM   HGBA1C 6.8 (H) 04/23/2021 12:23 PM   FRUCTOSAMINE 305 (H) 12/24/2018 05:53 PM   GFR 28.39 (L) 10/03/2021 11:37 AM   GFR 26.41 (L) 02/15/2021 03:29 PM   MICROALBUR 6.9 08/06/2016 02:32 PM   MICROALBUR 10.0 12/12/2015 03:48 PM    Last diabetic Eye exam: No results found for: HMDIABEYEEXA  Last diabetic Foot exam: No results found for: HMDIABFOOTEX   Lab Results  Component Value Date   CHOL 96 10/03/2021   HDL 38.90 (L) 10/03/2021   LDLCALC 43 10/03/2021   TRIG 70.0 10/03/2021   CHOLHDL 2 10/03/2021    Hepatic Function Latest Ref Rng & Units 10/03/2021 03/03/2021 02/26/2021  Total Protein 6.0 - 8.3 g/dL 6.4 6.1(L) 7.2  Albumin 3.5 - 5.2 g/dL 3.2(L) 3.0(L) 3.5  AST 0 - 37 U/L 10 16 17   ALT 0 - 35 U/L 7 14 15   Alk Phosphatase 39 - 117 U/L 82 55 64  Total Bilirubin 0.2 - 1.2 mg/dL 0.3 0.4 0.7   Bilirubin, Direct 0.00 - 0.40 mg/dL - - -    Lab Results  Component Value Date/Time   TSH 1.160 03/27/2021 12:47 PM   TSH 1.400 09/26/2020 12:16 PM   FREET4 1.19 03/22/2019 11:59 AM   FREET4 1.1 04/16/2016 04:50 PM    CBC Latest Ref Rng & Units 10/12/2021 10/03/2021 04/25/2021  WBC 4.0 - 10.5 K/uL 8.0 8.8 7.5  Hemoglobin 12.0 - 15.0 g/dL 9.4(L) 9.1(L) 12.1  Hematocrit 36.0 - 46.0 % 31.0(L) 28.4(L) 38.1  Platelets 150 - 400 K/uL 309 257.0 185    Lab Results  Component Value Date/Time   VD25OH 32 06/01/2012 10:00 AM   CHA2DS2-VASc Score = 8  The patient's score is based upon: CHF History: 1 HTN History: 1 Diabetes History: 1 Stroke History: 2 Vascular Disease History: 1 Age Score: 1 Gender Score: 1     Clinical ASCVD: Yes  The ASCVD Risk score (Arnett DK, et al., 2019) failed to calculate for the following reasons:   The patient has a prior MI or stroke diagnosis    Depression screen [facility] 2/9 03/30/2021 03/21/2021 09/28/2020  Decreased Interest 0 0 0  Down, Depressed, Hopeless - 0 1  PHQ - 2 Score 0 0 1  Altered sleeping - 0 0  Tired, decreased energy - 0 3  Change in appetite - 0 3  Feeling bad or failure about yourself  - 0 0  Trouble concentrating - 0 0  Moving slowly or fidgety/restless - 0 0  Suicidal thoughts - 0 0  PHQ-9 Score - 0 7  Difficult doing work/chores - Not difficult at all Not difficult at all  Some recent data might be hidden    Social History   Tobacco Use  Smoking Status Never  Smokeless Tobacco Never   BP Readings from Last 3 Encounters:  11/14/21 130/70  10/13/21 123/90  09/24/21 (!) 147/80   Pulse Readings from Last 3 Encounters:  11/14/21 69  10/13/21 68  09/24/21 65   Wt Readings from Last 3 Encounters:  09/24/21 280 lb (127 kg)  07/23/21 273 lb (123.8 kg)  05/21/21 277 lb 9.6 oz (125.9 kg)   BMI Readings from Last 3 Encounters:  11/14/21 42.57 kg/m  09/24/21 42.57 kg/m  07/23/21 41.51 kg/m     Assessment/Interventions: Review of patient past medical history, allergies, medications, health status, including review of consultants reports, laboratory and other test data, was performed as part of comprehensive evaluation and provision of chronic care management services.   SDOH:  (Social Determinants of Health) assessments and interventions performed: Yes  SDOH Screenings   Alcohol Screen: Low Risk    Last Alcohol Screening Score (AUDIT): 0  Depression (PHQ2-9): Low Risk    PHQ-2 Score: 0  Financial Resource Strain: Low Risk    Difficulty of Paying Living Expenses: Not hard at all  Food Insecurity: No Food Insecurity   Worried About Charity fundraiser in the Last Year: Never true   Ran Out of Food in the Last Year: Never true  Housing: Low Risk    Last Housing Risk Score: 0  Physical Activity: Inactive   Days of Exercise per Week: 0 days   Minutes of Exercise per Session: 0 min  Social Connections: Not on file  Stress: No Stress Concern Present   Feeling of Stress : Not at all  Tobacco Use: Low Risk    Smoking Tobacco Use: Never   Smokeless Tobacco Use: Never   Passive Exposure: Not on file  Transportation Needs: No Transportation Needs   Lack of Transportation (Medical): No   Lack of Transportation (Non-Medical): No    CCM Care Plan  Allergies  Allergen Reactions   Heparin     Can not remember reaction   Nsaids     Kidney disease    Medications Reviewed Today     Reviewed by Rafael Bihari, FNP (Family Nurse Practitioner) on 11/14/21 at 1600  Med List Status: <None>   Medication Order Taking? Sig Documenting Provider Last Dose Status Informant  acetaminophen (TYLENOL) 500 MG tablet [id number] Yes Take 1,500 mg by mouth every 6 (six) hours as needed for moderate pain or headache. [provider] Taking Active Self  amiodarone (PACERONE) 200 MG tablet [id number] Yes TAKE 1 TABLET BY MOUTH ONCE DAILY  Patient taking differently: Take 200 mg by  mouth daily.   Bensimhon, Shaune Pascal, MD Taking Active Self  aspirin EC 81 MG EC tablet [id number] Yes Take 1 tablet (81 mg total) by mouth daily. Swallow whole. Nita Sells, MD Taking Active Self  atorvastatin (LIPITOR) 10 MG tablet [id number] Yes Take 1 tablet (10 mg total) by mouth  daily.  Patient taking differently: Take 10 mg by mouth at bedtime.   Geradine Girt, DO Taking Active Self  calcitRIOL (ROCALTROL) 0.5 MCG capsule [id number] Yes Take 0.5 mcg by mouth every morning. [provider] Taking Active Self  Calcium Carb-Cholecalciferol (CALCIUM CARBONATE-VITAMIN D3) 600-400 MG-UNIT TABS [id number] Yes Take 1 tablet by mouth 2 (two) times a day. [provider] Taking Active Self  carvedilol (COREG) 3.125 MG tablet [id number] Yes Take 3.125 mg by mouth in the morning and at bedtime. [provider] Taking Active Self  clopidogrel (PLAVIX) 75 MG tablet [id number] Yes Take 1 tablet (75 mg total) by mouth daily. NO FURTHER REFILLS UNTIL SEEN IN CLINIC, PLEASE CALL OFFICE TO SCHEDULE AN APPOINTMENT.  Patient taking differently: Take 75 mg by mouth at bedtime.   Minna Merritts, MD Taking Active Self  Continuous Blood Gluc Receiver (FREESTYLE LIBRE 2 READER) [place] [id number] Yes Use to check sugar multiple times per day as directed.  H/o DM2, treated with insulin. [name], MD Taking Active Self  Continuous Blood Gluc Sensor (FREESTYLE LIBRE 2 SENSOR) [place] [id number] Yes Use to check sugar multiple times per day as directed.  H/o DM2, treated with insulin. [name], MD Taking Active Self  DULoxetine (CYMBALTA) 30 MG capsule [id number] Yes Take 1 capsule (30 mg total) by mouth at bedtime. Alda Berthold, DO Taking Active Self  ferrous sulfate 325 (65 FE) MG tablet [id number] Yes Take 325 mg by mouth 2 (two) times daily with a meal. [provider] Taking Active Self  gabapentin (NEURONTIN) 600 MG tablet [id number] Yes Take 368m at bedtime.   Patient taking differently: Take 1,200 mg by mouth at bedtime. Take 3053mat bedtime.   PaAlda BertholdDO Taking Active Self  Insulin Aspart FlexPen (NOVOLOG) 100 UNIT/ML [id number] INJECT 0.05 ML (5 UNITS TOTAL) INTO THE SKIN 3 TIMES DAILY BEFORE MEALS  Patient taking differently: Inject 5 Units into the skin in the morning, at noon, and at bedtime.   DuTonia GhentMD Taking Active Self  Insulin Pen Needle (PEN NEEDLES) 32G X 6 MM MISC [id number] 1 each by Does not apply route 4 (four) times daily. SaJacelyn PiIrLilia ArgueMD Taking Active Self  isosorbide mononitrate (IMDUR) 30 MG 24 hr tablet [id number] TAKE 1 TABLET BY MOUTH ONCE DAILY  Patient taking differently: Take 30 mg by mouth daily.   Bensimhon, DaShaune PascalMD Taking Active Self  LANTUS SOLOSTAR 100 UNIT/ML Solostar Pen [id number] INJECT 22 UNITS UNDER THE SKIN DAILY. DuTonia GhentMD Taking Active   levothyroxine (SYNTHROID) 50 MCG tablet [id number] Take 1 tablet (50 mcg total) by mouth daily before breakfast. DuTonia GhentMD Taking Active Self  losartan (COZAAR) 25 MG tablet [id number] TAKE 1/2 TABLET BY MOUTH DAILY  Patient taking differently: Take 12.5 mg by mouth daily.   Bensimhon, DaShaune PascalMD Taking Active Self  Multiple Vitamin (MULTIVITAMIN WITH MINERALS) TABS tablet [id number] Take 1 tablet by mouth every morning. [provider] Taking Active Self  nitroGLYCERIN (NITROSTAT) 0.4 MG SL tablet [id number] Place 1 tablet (0.4 mg total) under the tongue every 5 (five) minutes as needed for chest pain. KlDeboraha SprangMD Taking Active Self  Omega-3 Fatty Acids (FISH OIL) 1000 MG CAPS [id number] Take 1,000 mg by mouth 2 (two) times a day. [provider] Taking Active Self  Semaglutide,0.25 or 0.5MG/DOS, (OZEMPIC, 0.25 OR 0.5 MG/DOSE,) 2  MG/1.5ML SOPN [id number] Yes Inject 0.25 mg into the skin once a week. [name], MD Taking Active   torsemide (DEMADEX) 20 MG tablet  [id number]  Take 1 tablet (20 mg total) by mouth daily. Allena Katz [place], [place]  Expired 10/13/21 2359 Self  traMADol (ULTRAM) 50 MG tablet [id number] Yes TAKE 1 TO 2 TABLET BY MOUTH AT BEDTIME AS NEEDED [name], MD Taking Active             Patient Active Problem List   Diagnosis Date Noted   Localized osteoarthritis of knees, bilateral 07/25/2021   Medication monitoring encounter 03/31/2021   Cellulitis of left foot 02/27/2021   Cellulitis 02/27/2021   Osteomyelitis ([place]) 02/18/2021   CKD (chronic kidney disease) stage 4, GFR 15-29 ml/min (HCC) 01/28/2021   AICD (automatic cardioverter/defibrillator) present 01/28/2021   Diabetic foot ulcer ([place]) 11/24/2020   Advance care planning 10/04/2020   Secondary hyperparathyroidism of renal origin ([place]) 07/20/2019   Hyperkalemia 07/20/2019   Benign hypertensive kidney disease with chronic kidney disease 07/20/2019   Anemia in chronic kidney disease 07/20/2019   Positive antinuclear antibody 07/20/2019   Proteinuria 07/20/2019   Acute on chronic systolic (congestive) heart failure ([place]) 03/23/2019   UTI (urinary tract infection) 03/09/2019   CAD (coronary artery disease) 03/09/2019   DM type 2, uncontrolled, with renal complications 26/71/2458   Atrial tachycardia ([place])    Acute kidney injury superimposed on chronic kidney disease ([place])    AKI (acute kidney injury) ([place])    Weakness generalized 12/22/2018   Shortness of breath 12/22/2018   Palliative care encounter 12/22/2018   Endocarditis 12/14/2018   NSTEMI (non-ST elevated myocardial infarction) ([place]) 11/22/2018   Acute on chronic combined systolic and diastolic CHF (congestive heart failure) ([place]) 10/28/2018   Lymphedema 10/28/2018   Severe sepsis ([place]) 09/28/2018   History of amputation of lesser toe of right foot ([place]) 04/19/2018   Presence of cardiac resynchronization therapy defibrillator (CRT-D) 04/19/2018   Paraparesis of both lower limbs ([place]) 03/29/2018    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) 03/29/2018   Class 3 obesity due to excess calories with serious comorbidity and body mass index (BMI) of 40.0 to 44.9 in adult 09/02/2016   History of CVA (cerebrovascular accident) 11/11/2015   Chronic renal insufficiency 08/04/2015   OSA (obstructive sleep apnea) 08/15/2014   Morbid obesity ([place]) 10/26/2013   Essential hypertension, benign 04/29/2013   Pure hypercholesterolemia 12/07/2012   Iron deficiency anemia 12/07/2012   Diabetic peripheral neuropathy associated with type 2 diabetes mellitus ([place]) 09/98/3382   Chronic systolic congestive heart failure ([place]) 07/29/2012   Left bundle-branch block 07/25/2012   Coronary artery disease involving native coronary artery of native heart without angina pectoris 07/25/2012   Heart failure ([place]) 07/21/2012   Hyperlipidemia 07/21/2012    Immunization History  Administered Date(s) Administered   Fluad Quad(high Dose 65+) 10/03/2021   Hepatitis B 03/16/2013   Hepatitis B, adult 03/30/2014, 07/18/2014   Influenza Split 07/21/2012, 07/03/2020   Influenza, High Dose Seasonal PF 07/01/2019   Influenza,inj,Quad PF,6+ Mos 06/16/2013, 07/07/2014, 06/18/2015, 08/06/2016, 07/20/2018   Influenza-Unspecified 07/23/2017, 07/22/2018, 06/11/2019   PFIZER Comirnaty(Gray Top)Covid-19 Tri-Sucrose Vaccine 06/21/2021   PFIZER(Purple Top)SARS-COV-2 Vaccination 11/21/2019, 12/21/2019, 07/19/2020   PNEUMOCOCCAL CONJUGATE-20 06/21/2021   Pneumococcal Polysaccharide-23 08/08/2011, 06/18/2015, 08/13/2016   Tdap 03/07/2010, 05/03/2016   Zoster, Live 04/06/2014    Conditions to be addressed/monitored:  Hypertension, Hyperlipidemia, Diabetes, Atrial Fibrillation, Heart Failure, Coronary Artery Disease, and Chronic Kidney Disease  There are no care plans that you recently modified to display for this patient.    Medication Assistance: None required.  Patient affirms current  coverage meets needs.  Compliance/Adherence/Medication fill history: Care Gaps: DEXA Eye exam (04/09/19) Foot exam (07/21/19)  Star-Rating Drugs: Atorvastatin - LF 09/07/21 x 30 ds; PDC 63% Losartan - LF 09/07/21 x 90 ds; [place] 100%  Patient's preferred pharmacy is:  [facility] ([place]) [address], [place] [address] Phone: [phone] Fax: [phone]  [place], [address] [place] [address] Phone: [phone] Fax: [phone]  [facility] [address] [address] Phone: [phone] Fax: [phone]   Uses pill box? Yes Pt endorses 100% compliance  We discussed: Benefits of medication synchronization, packaging and delivery as well as enhanced pharmacist oversight with [employer]. Patient decided to: Continue current medication management strategy  Care Plan and Follow Up Patient Decision:  Patient agrees to Care Plan and Follow-up.  Plan: Telephone follow up appointment with care management team member scheduled for:  1 month  Charlene Brooke, PharmD, [facility] Clinical Pharmacist [facility] [phone]   Current Barriers:  Unable to independently monitor therapeutic efficacy Unable to maintain control of DM, HF  Pharmacist Clinical Goal(s):  Patient will achieve adherence to monitoring guidelines and medication adherence to achieve therapeutic efficacy adhere to plan to optimize therapeutic regimen for DM, HF as evidenced by report of adherence to recommended medication management changes through collaboration with PharmD and provider.   Interventions: 1:1 collaboration with [name], MD regarding development and update of comprehensive plan of care as evidenced by provider attestation and co-signature Inter-disciplinary care team collaboration (see longitudinal plan of  care) Comprehensive medication review performed; medication list updated in electronic medical record  Hyperlipidemia: (LDL goal < 70) -Controlled - LDL is a goal; pt reports compliance with medications as prescribed; she endorses some bleeding issues - when she nicks herself it takes longer to stop bleeding, she denies blood in urine/stool -Hx CAD - MI 07/2012 w/ stent; Hx stroke -Current treatment: Atorvastatin 10 mg daily - Appropriate, Effective, Safe, Accessible Isosorbide MN 30 mg daily - Appropriate, Effective, Safe, Accessible Nitroglycerin 0.4 mg SL prn - Appropriate, Effective, Safe, Accessible Clopidogrel 75 mg daily HS - Appropriate, Effective, Safe, Accessible Aspirin 81 mg daily - Appropriate, Effective, Safe, Accessible -Educated on Cholesterol goals; Benefits of statin for ASCVD risk reduction; -Counseled on benefits of clopidogrel/aspirin for stent patency; discussed nuisance bleeding vs major bleeding; counseled on signs/symptoms of major bleeding and when to seek emergency care -Recommended to continue current medication  Diabetes (A1c goal <7%) -Uncontrolled, worsening- A1c is above goal; pt endorses compliance with insulin regimen as prescribed; she reports she typically takes Novolog twice a day since she eats 2 meals per day - usually ~5 units in AM, 6-7 units with PM meal -Pt reports rare hypoglycemia; she uses peanut butter for hypoglycemic episodes -Hx toe amputation, osteomyelitis -Pt started using Freestyle Libre ~2 weeks ago and provides following readings -7-day avg glucose 198 Before  After  Before  After   Bedtime breakfast Breakfast Supper  Supper 1/1 103  264      154 1/2 142  305  186  225  179 1/3 188  320  288  188  188 1/4 144    288    315 1/5 174  303  256  298  228 1/6 167  202 1/7 240  266  260  254  233 1/8 144    130  254  226 1/9 164  343  288  382  249 1/10 111  242  237  193  148 1/11 110  202  170  308 -Current medications: Lantus 22  units daily HS -Appropriate, Effective, Safe, Accessible Novolog 5-10 units TID per sliding scale - usually BID -Appropriate, Effective, Safe, Accessible Ozempic 0.25 mg weekly -Appropriate, Effective, Safe, Accessible Freestyle Libre 2 -Medications previously tried: Musician, glimepiride, Jardiance, metformin, Januvia -Fasting BG are slightly elevated, post-prandial BG after breakfast and supper are much higher, would recommend targeting post-prandial sugars with a GLP-1 agonist to limit risk for hypoglycemia -Recommend starting Ozempic 0.25 mg weekly, titrate up to 0.5 mg after 4 weeks  Atrial Tachyardia/NSVT/AFIB (Goal: prevent stroke and major bleeding) -Controlled - pt reports palpitations have improved since being on amiodarone -Clarified Afib dx previously - pt had 1 hr of SCAF on device, no repeat events; cardiology has not recommended anticoagulation -CHADSVASC: 8; per chart review patient has never been on anticoagulation for Afib (was briefly on Eliquis for a DVT in 2016); she is currently on DAPT for hx CAD w/ stent -Current treatment: Amiodarone 200 mg daily - Appropriate, Effective, Safe, Accessible Carvedilol 3.125 mg AM -Appropriate, Effective, Safe, Accessible -Medications previously tried: Eliquis (for DVT, 2016) -Counseled on benefits of rate/rhythm control drugs to prevent tachycardia -Recommended to continue current medication  Heart Failure (Goal: manage symptoms and prevent exacerbations) -Not ideally controlled - pt was seen in the ED last week for excess swelling/HF exacerbation and given IV lasix; she increased her torsemide to 3 tablets and reports leg swelling is back to normal -Current home BP/HR readings: 139/80, HR high 50s-low 60s -Last ejection fraction: 50-55% (Date: 03/07/20) -HF type: Diastolic; NYHA Class: I-II -Current treatment: Carvedilol 3.125 mg BID -Appropriate, Effective, Safe, Accessible Isosorbide MN 30 mg daily -Appropriate, Effective, Safe,  Accessible Losartan 25 mg - 1/2 tab daily -Appropriate, Effective, Safe, Accessible Torsemide 20 mg daily -Appropriate, Effective, Safe, Accessible -Medications previously tried: Jardiance 10 mg -Educated on Benefits of medications for managing symptoms and prolonging life; Importance of blood pressure control -Discussed SGLT2i (Jardiance) - given comorbidities (CKD, HF, DM) data supports use of SGLT2i; pt does have recent amputation of toe and SGLT2i have historically been linked to higher amputation risk, usually in patients with A1c >8; given recency of amputation it is reasonable to hold off restarting Jardiance, but ultimately benefits of SGLT2 likely outweigh risk in this patient -Advised pt return to torsemide 20 mg/day and call HF clinic for f/u appt  CKD stage 4 / HyperPTH (Goal: prevent progression) -Worsening - most recent GFR is worse (36 > 28>25 from Aug to Dec to Feb 2023), now in Stage 4 CKD -Current treatment  Calcitriol 0.5 mg daily AM -Appropriate, Effective, Safe, Accessible Ferrous sulfate 325 mg BID -Appropriate, Effective, Safe, Accessible Losartan 25 mg - 1/2 tab daily -Appropriate, Effective, Safe, Accessible -Counseled on maintaining adequate hydration; importance of BP and DM control to prevent CKD progression; avoidance of NSAIDs and other nephrotoxins -Recommended to continue current medication  Health Maintenance -Vaccine gaps: Shingrix, covid booster, flu -Advised to get Flu vaccine as soon as able  Patient Goals/Self-Care Activities Patient will:  - take medications as prescribed -focus on medication adherence by pill box -check glucose daily, document, and provide at future appointments -check blood pressure daily, document, and provide at future  appointments -Email Freestyle report to PharmD -Call HF clinic (Dr Haroldine Laws) for f/u appt

## 2021-11-19 NOTE — Telephone Encounter (Signed)
°  Chronic Care Management   Outreach Note  11/19/2021 Name: [name] MRN: [id number] DOB: April 12, 1956  Referred by: [name], MD  Patient had a phone appointment scheduled with clinical pharmacist today.  An unsuccessful telephone outreach was attempted today. The patient was referred to the pharmacist for assistance with medications, care management and care coordination.   Patient will NOT be penalized in any way for missing a CCM appointment. The no-show fee does not apply.  If possible, a message was left to return call to: [phone] or to [facility].  Charlene Brooke, PharmD, BCACP Clinical Pharmacist [facility] [phone]

## 2021-11-20 ENCOUNTER — Ambulatory Visit (INDEPENDENT_AMBULATORY_CARE_PROVIDER_SITE_OTHER): Payer: HMO | Admitting: Internal Medicine

## 2021-11-20 ENCOUNTER — Encounter: Payer: Self-pay | Admitting: Internal Medicine

## 2021-11-20 ENCOUNTER — Other Ambulatory Visit: Payer: Self-pay

## 2021-11-20 VITALS — BP 110/54 | HR 64 | Ht 68.0 in

## 2021-11-20 DIAGNOSIS — I5022 Chronic systolic (congestive) heart failure: Secondary | ICD-10-CM

## 2021-11-20 DIAGNOSIS — Z95810 Presence of automatic (implantable) cardiac defibrillator: Secondary | ICD-10-CM

## 2021-11-20 DIAGNOSIS — Z79899 Other long term (current) drug therapy: Secondary | ICD-10-CM

## 2021-11-20 DIAGNOSIS — I493 Ventricular premature depolarization: Secondary | ICD-10-CM | POA: Diagnosis not present

## 2021-11-20 DIAGNOSIS — I428 Other cardiomyopathies: Secondary | ICD-10-CM | POA: Diagnosis not present

## 2021-11-20 LAB — PACEMAKER DEVICE OBSERVATION

## 2021-11-20 MED ORDER — AMIODARONE HCL 200 MG PO TABS: ORAL_TABLET | ORAL | 1 refills | Status: DC

## 2021-11-20 NOTE — Progress Notes (Signed)
Patient ID: [name], female   DOB: 07/04/1956, 66 y.o.   MRN: [id number]      Patient Care Team: [name], MD as PCP - General (Family Medicine) Minna Merritts, MD as PCP - Cardiology (Cardiology) Deboraha Sprang, MD as PCP - Electrophysiology (Cardiology) Bensimhon, Shaune Pascal, MD as PCP - Advanced Heart Failure (Cardiology) Calvert Cantor, MD as Consulting Physician (Ophthalmology) Lavonia Dana, MD as Consulting Physician (Internal Medicine) Elvina Mattes, Adele Schilder (Inactive) as Attending Physician (Podiatry) Tsosie Billing, MD as Consulting Physician (Infectious Diseases) Alda Berthold, DO as Consulting Physician (Neurology) [name], [facility] as Pharmacist (Pharmacist)   HPI  [name] is a 66 y.o. female CRT-D implanted for nonischemic cardiomyopathy. Developed recurrent MSSA bacteremia and underwent system extraction 3/20 with aspiration of the thrombus of the lead.  Recurrent heart failure and interval worsening of LV function prompted decision to reimplant done [facility]) 6/20  Following reimplantation LV function has improved considerably (see below)  History of atrial tachycardia and PVCs  for which she has been treated with amiodarone. You have [name]   The patient denies chest pain, nocturnal dyspnea, orthopnea.  There have been no palpitations, lightheadedness or syncope.  Complains of significant bilateral peripheral edema.  Diuresis has been challenging given her elevated creatinine.  Associated with significant dyspnea on exertion but she is also limited in her ambulation because of knee issues with "bone-on-bone "in her right knee with discussions about knee surgery.   Patient denies symptoms of GI intolerance, sun sensitivity, neurological symptoms attributable to amiodarone.    DATE TEST EF%    9/15  echo  30 %    1 /17 echo  50 %    12/19 Echo  45-50%    12/19 TEE   No vegetations  12/19 LHC   pLAD80 with ISR;  mLAD70 pCx 80;D1 90 mRCA 40  2/20 CT     2/20 TEE    3/20  (2) Echo  40%   6/20 Echo  10-15%   12/20 Echo 35%   6/21 Echo  50-55%     Date Cr K Hgb TSH LFTs   LDL  3/20 3.54>>1.18  11 4.64  18   7/20 2.15 4.9 9.7 (6/20)17.4    18   9/20  2.02 4.3  21.4  55  11/20 2.17 4.7 11.2 6.17 23   1/21 2.44 4.9      12/21 2.09 5.1 11.6     6/22  (3/22)2.48 1.58 (3/22)4.7 4.2 (3/22)13.0 10.8     2/23 2.15<<1.87         Past Medical History:  Diagnosis Date   Acute myocardial infarction, subendocardial infarction, initial episode of care ([place]) 07/21/2012   Acute osteomyelitis involving ankle and foot ([place]) 69/67/8938   Acute systolic heart failure ([place]) 07/21/2012   New onset 07/19/12; admission to [facility] ED. Elevated Troponins.  S/p 2D-echo with EF 20-25%.  S/p cardiac catheterization with stenting LAD.  Repeat 2D-echo 10/2011 with improved EF of 35%.    Anemia    Automatic implantable cardioverter-defibrillator in situ    a. MDT CRT-D 06/2014, SN: [id number]  .removed in feb 2020   CAD (coronary artery disease)    a. cardiac cath 101/04/2012: PCI/DES to chronically occluded mLAD, consideration PCI to diag branch in 4 weeks.    Cataract    Chicken pox    Chronic kidney disease    Chronic systolic CHF (congestive heart failure) ([place])  a. mixed ICM & NICM; b. EF 20-25% by echo 07/2012, mid-dist 2/3 of LV sev HK/AK, mild MR. echo 10/2012: EF 30-35%, sev HK ant-septal & inf walls, GR1DD, mild MR, PASP 33. c. echo 02/2013: EF 30%, GR1DD, mild MR. echo 04/2014: EF 30%, Septal-lat dyssynchrony, global HK, inf AK, GR1DD, mild MR. d. echo 10/2014: EF50-55%, WM nl, GR1DD, septal mild paradox. e. echo 02/2015: EF 50-55%, wm    Depression    Heart attack ([place])    Heel ulcer ([place]) 04/27/2015   History of blood transfusion ~ 2011   "plasma; had neuropathy; couldn't walk"   Hypertension    Hypothyroidism    LBBB (left bundle branch block)    Neuromuscular disorder ([place])    Neuropathy 2011    Obesity, unspecified    OSA on CPAP    Moderate with AHI 23/hr and now on CPAP at 16cm H2O.  Her DME is [facility]   Pure hypercholesterolemia    Sepsis ([place]) 09/2018   Type II diabetes mellitus ([place])    Unspecified vitamin D deficiency    Past Surgical History:  Procedure Laterality Date   ABDOMINAL AORTOGRAM W/LOWER EXTREMITY N/A 02/02/2021   Procedure: ABDOMINAL AORTOGRAM W/LOWER EXTREMITY;  Surgeon: Angelia Mould, MD;  Location: [place] CV LAB;  Service: Cardiovascular;  Laterality: N/A;   AMPUTATION Left 03/02/2021   Procedure: AMPUTATION RAY, left great toe;  Surgeon: Trula Slade, DPM;  Location: [place];  Service: Podiatry;  Laterality: Left;   AMPUTATION TOE Right 06/18/2015   Procedure: AMPUTATION TOE;  Surgeon: Samara Deist, DPM;  Location: [facility] ORS;  Service: Podiatry;  Laterality: Right;   AMPUTATION TOE Left 10/08/2018   Procedure: AMPUTATION TOE LEFT 2ND;  Surgeon: Albertine Patricia, DPM;  Location: [facility] ORS;  Service: Podiatry;  Laterality: Left;   BACK SURGERY     BI-VENTRICULAR IMPLANTABLE CARDIOVERTER DEFIBRILLATOR N/A 07/06/2014   Procedure: BI-VENTRICULAR IMPLANTABLE CARDIOVERTER DEFIBRILLATOR  (CRT-D);  Surgeon: Deboraha Sprang, MD;  Location: [facility] CATH LAB;  Service: Cardiovascular;  Laterality: N/A;   BI-VENTRICULAR IMPLANTABLE CARDIOVERTER DEFIBRILLATOR  (CRT-D)  07/06/2014   BILATERAL OOPHORECTOMY  01/2011   ovarian cyst benign   BIV ICD INSERTION CRT-D N/A 03/31/2019   Procedure: BIV ICD INSERTION CRT-D;  Surgeon: Constance Haw, MD;  Location: [place] CV LAB;  Service: Cardiovascular;  Laterality: N/A;   COLONOSCOPY WITH PROPOFOL Left 02/22/2015   Procedure: COLONOSCOPY WITH PROPOFOL;  Surgeon: Hulen Luster, MD;  Location: [facility] ENDOSCOPY;  Service: Endoscopy;  Laterality: Left;   CORONARY ANGIOPLASTY WITH STENT PLACEMENT Left 07/2012   new onset systolic CHF; elevated troponins.  Cardiac catheterization with stenting to LAD; EF 15%.  2D-echo: EF 20-25%.    ESOPHAGOGASTRODUODENOSCOPY N/A 02/22/2015   Procedure: ESOPHAGOGASTRODUODENOSCOPY (EGD);  Surgeon: Hulen Luster, MD;  Location: [facility] ENDOSCOPY;  Service: Endoscopy;  Laterality: N/A;   I & D EXTREMITY Left 11/28/2020   Procedure: IRRIGATION AND DEBRIDEMENT OF FOOT;  Surgeon: Trula Slade, DPM;  Location: [place];  Service: Podiatry;  Laterality: Left;   I & D EXTREMITY Left 01/31/2021   Procedure: IRRIGATION AND DEBRIDEMENT EXTREMITY OF LEFT FOOT WITH BONE BIOPSY OF  1ST METATARSAL AND FIFTH METATARSAL;  Surgeon: Felipa Furnace, DPM;  Location: [place];  Service: Podiatry;  Laterality: Left;   I & D EXTREMITY Left 02/03/2021   Procedure: IRRIGATION AND DEBRIDEMENT EXTREMITY WITH INTERNAL AMPUTATION FIRST AND FIFTH RAY;  Surgeon: Felipa Furnace, DPM;  Location: [place];  Service: Podiatry;  Laterality: Left;   INCISION AND DRAINAGE ABSCESS Right 2007   groin; with ICU stay due to sepsis.   IR FLUORO GUIDE CV LINE RIGHT  03/06/2021   IR REMOVAL TUN CV CATH W/O FL  04/27/2021   IR US GUIDE VASC ACCESS RIGHT  03/06/2021   IRRIGATION AND DEBRIDEMENT FOOT Left 04/25/2021   Procedure: EXCISION AND DEBRIDEMENT WOUND ULCER LEFT FOOT; METATARSECTOMY;  Surgeon: Trula Slade, DPM;  Location: [facility];  Service: Podiatry;  Laterality: Left;   LAPAROSCOPIC CHOLECYSTECTOMY  2011   LEFT HEART CATH AND CORONARY ANGIOGRAPHY N/A 10/05/2018   Procedure: LEFT HEART CATH AND CORONARY ANGIOGRAPHY;  Surgeon: Minna Merritts, MD;  Location: [place] CV LAB;  Service: Cardiovascular;  Laterality: N/A;   LEFT HEART CATHETERIZATION WITH CORONARY ANGIOGRAM N/A 07/21/2012   Procedure: LEFT HEART CATHETERIZATION WITH CORONARY ANGIOGRAM;  Surgeon: Jolaine Artist, MD;  Location: [facility] CATH LAB;  Service: Cardiovascular;  Laterality: N/A;   LUMBAR [place]   L4-5   PACEMAKER REMOVAL  11/2018   due to infection around pacemaker   [place] (PCI-S) N/A 07/23/2012   Procedure:  PERCUTANEOUS CORONARY STENT INTERVENTION (PCI-S);  Surgeon: Sherren Mocha, MD;  Location: [facility] CATH LAB;  Service: Cardiovascular;  Laterality: N/A;   PERIPHERAL VASCULAR BALLOON ANGIOPLASTY Left 10/06/2018   Procedure: PERIPHERAL VASCULAR BALLOON ANGIOPLASTY;  Surgeon: Katha Cabal, MD;  Location: [place] CV LAB;  Service: Cardiovascular;  Laterality: Left;   PERIPHERAL VASCULAR CATHETERIZATION N/A 02/10/2015   Procedure: Picc Line Insertion;  Surgeon: Katha Cabal, MD;  Location: [place] CV LAB;  Service: Cardiovascular;  Laterality: N/A;   RIGHT HEART CATH N/A 03/29/2019   Procedure: RIGHT HEART CATH;  Surgeon: Jolaine Artist, MD;  Location: [place] CV LAB;  Service: Cardiovascular;  Laterality: N/A;   TEE WITHOUT CARDIOVERSION N/A 10/02/2018   Procedure: TRANSESOPHAGEAL ECHOCARDIOGRAM (TEE);  Surgeon: Wellington Hampshire, MD;  Location: [facility] OR[facility];  Service: Cardiovascular;  Laterality: N/A;   [place]  01/2011   Fibroids/DUB.  Ovaries removed. [place].   Current Meds  Medication Sig   acetaminophen (TYLENOL) 500 MG tablet Take 1,500 mg by mouth every 6 (six) hours as needed for moderate pain or headache.   amiodarone (PACERONE) 200 MG tablet TAKE 1 TABLET BY MOUTH ONCE DAILY   aspirin EC 81 MG EC tablet Take 1 tablet (81 mg total) by mouth daily. Swallow whole.   atorvastatin (LIPITOR) 10 MG tablet Take 1 tablet (10 mg total) by mouth daily.   calcitRIOL (ROCALTROL) 0.5 MCG capsule Take 0.5 mcg by mouth every morning.   Calcium Carb-Cholecalciferol (CALCIUM CARBONATE-VITAMIN D3) 600-400 MG-UNIT TABS Take 1 tablet by mouth 2 (two) times a day.   carvedilol (COREG) 3.125 MG tablet TAKE 1 TABLET BY MOUTH TWICE (2) DAILY WITH A MEAL   clopidogrel (PLAVIX) 75 MG tablet Take 75 mg by mouth daily.   Continuous Blood Gluc Receiver (FREESTYLE LIBRE 2 READER) DEVI Use to check sugar multiple times per day as directed.  H/o DM2, treated  with insulin.   Continuous Blood Gluc Sensor (FREESTYLE LIBRE 2 SENSOR) MISC Use to check sugar multiple times per day as directed.  H/o DM2, treated with insulin.   DULoxetine (CYMBALTA) 30 MG capsule Take 1 capsule (30 mg total) by mouth at bedtime.   ferrous sulfate 325 (65 FE) MG tablet Take 325 mg by mouth 2 (two) times daily with a  meal.   gabapentin (NEURONTIN) 600 MG tablet Take 300mg  at bedtime. (Patient taking differently: Take 1,200 mg by mouth at bedtime. Take 300mg  at bedtime.)   Insulin Aspart FlexPen (NOVOLOG) 100 UNIT/ML INJECT 0.05 ML (5 UNITS TOTAL) INTO THE SKIN 3 TIMES DAILY BEFORE MEALS (Patient taking differently: Inject 5 Units into the skin in the morning, at noon, and at bedtime.)   Insulin Pen Needle (PEN NEEDLES) 32G X 6 MM MISC 1 each by Does not apply route 4 (four) times daily.   isosorbide mononitrate (IMDUR) 30 MG 24 hr tablet TAKE 1 TABLET BY MOUTH ONCE DAILY   LANTUS SOLOSTAR 100 UNIT/ML Solostar Pen INJECT 22 UNITS UNDER THE SKIN DAILY.   levothyroxine (SYNTHROID) 50 MCG tablet Take 1 tablet (50 mcg total) by mouth daily before breakfast.   losartan (COZAAR) 25 MG tablet Take 12.5 mg by mouth daily.   Multiple Vitamin (MULTIVITAMIN WITH MINERALS) TABS tablet Take 1 tablet by mouth every morning.   nitroGLYCERIN (NITROSTAT) 0.4 MG SL tablet Place 1 tablet (0.4 mg total) under the tongue every 5 (five) minutes as needed for chest pain.   Omega-3 Fatty Acids (FISH OIL) 1000 MG CAPS Take 1,000 mg by mouth 2 (two) times a day.   Semaglutide,0.25 or 0.5MG /DOS, (OZEMPIC, 0.25 OR 0.5 MG/DOSE,) 2 MG/1.5ML SOPN Inject 0.25 mg into the skin once a week.   torsemide (DEMADEX) 20 MG tablet Take 1 tablet (20 mg total) by mouth daily.   traMADol (ULTRAM) 50 MG tablet TAKE 1 TO 2 TABLET BY MOUTH AT BEDTIME AS NEEDED   Allergies  Allergen Reactions   Heparin     Can not remember reaction   Nsaids     Kidney disease   Review of Systems negative except from HPI and  PMH  Physical Exam: BP (!) 110/54 (BP Location: Right Arm, Patient Position: Sitting, Cuff Size: Large)    Pulse 64    Ht 5\' 8"  (1.727 m)    SpO2 99%    BMI 42.57 kg/m    Well developed and Morbidly obese in no acute distress HENT normal Neck supple with JVO >10 Clear Device pocket well healed; without hematoma or erythema.  There is no tethering w/ some subcutaneous atrophy Regular rate and rhythm, no murmur Abd-soft with active BS No Clubbing cyanosis 3+ edema Skin-warm and dry A & Oriented  Grossly normal sensory and motor function   ECG: Sinus with P synchronous pacing Intervals 14/18/49we were able to change the vector from 3--coil to 2--coil and maintain an upright QRS lead V1. 8/22 Sinus with P synchronous pacing with a negative QRS lead V1 and an upright QRS lead I>> Electrical resynchronization was successful  Review of the tracing from 11/21 showed a QR in lead I and RS in lead V1  Assessment and  Plan  CRT-D recent reimplantation following extraction--loss of LV capture  Nonischemic cardiomyopathy  Coronary artery disease  Congestive heart failure- chronic-systolic class II  Renal insufficiency grade 3   Hypothyroidism  High Risk Medication Surveillance-Amiodarone   PVCs  Recent amputation  Volume status is significantly overloaded.  She saw heart failure yesterday and they will be managing her diuretics.    We will continue her on her amiodarone but decrease it from 200 mg 7 days a week--5 days a week.  Needs surveillance laboratories.  Increased voltage threshold in the 3-coil configuration we were able to reprogram it back to 2-coil  with a short AV delay  maintained an upright QRS  in lead V1 on arrival,    Renal function remains problematic -- last measurements show an increase in the creatinine from stable 1.8--2.15.  We will check it today  No ischemic symptoms, we will continue her aspirin clopidogrel and isosorbide 30  She also wanted to  discuss the possibility of knee surgery.  I am not sure whether her weight would be precluding.  Certainly her edema would need to be better controlled and with her renal function being worse, she might need in-hospital preparation for that.  We will recheck her metabolic profile today and follow-up with the heart failure clinic/nephrology regarding adjustments of her diuretics

## 2021-11-20 NOTE — Progress Notes (Addendum)
Called patient to reschedule her missed appointment from 11/19/21. No answer; No voicemail. Will try again tomorrow.   Charlene Brooke, CPP notified  Marijean Niemann, [place] Clinical Pharmacy Assistant [phone]  Time Spent: 3 Minutes

## 2021-11-20 NOTE — Patient Instructions (Addendum)
Medication Instructions:  - Your physician has recommended you make the following change in your medication:   1) DECREASE amiodarone 200 mg: - take 1 tablet by mouth 5 days a week (try not to skip 2 consecutive days in a row)  *If you need a refill on your cardiac medications before your next appointment, please call your pharmacy*   Lab Work: - Your physician recommends that you have lab work today: TSH/ BMP  If you have labs (blood work) drawn today and your tests are completely normal, you will receive your results only by: [place] (if you have [place]) OR A paper copy in the mail If you have any lab test that is abnormal or we need to change your treatment, we will call you to review the results.   Testing/Procedures: - none ordered    Follow-Up: At [facility], you and your health needs are our priority.  As part of our continuing mission to provide you with exceptional heart care, we have created designated Provider Care Teams.  These Care Teams include your primary Cardiologist (physician) and Advanced Practice Providers (APPs -  Physician Assistants and Nurse Practitioners) who all work together to provide you with the care you need, when you need it.  We recommend signing up for the patient portal called "MyChart".  Sign up information is provided on this After Visit Summary.  MyChart is used to connect with patients for Virtual Visits (Telemedicine).  Patients are able to view lab/test results, encounter notes, upcoming appointments, etc.  Non-urgent messages can be sent to your provider as well.   To learn more about what you can do with MyChart, go to [email or web address].    Your next appointment:   6 month(s)  The format for your next appointment:   In Person  Provider:   Virl Axe, MD    Other Instructions N/a

## 2021-11-20 NOTE — Telephone Encounter (Signed)
Called pt to schedule DM . Lvmtcb

## 2021-11-21 NOTE — Progress Notes (Signed)
Called patient to reschedule her missed appointment from 11/19/21. Patient has been rescheduled to 01/02/2022 at 3:00 with Charlene Brooke.  Charlene Brooke, CPP notified  Marijean Niemann, [place] Clinical Pharmacy Assistant [phone]  Time Spent: 10 Minutes

## 2021-11-22 ENCOUNTER — Ambulatory Visit (HOSPITAL_COMMUNITY)
Admission: RE | Admit: 2021-11-22 | Discharge: 2021-11-22 | Disposition: A | Discharge status: Home / Self Care | Payer: HMO | Source: Ambulatory Visit | Attending: Internal Medicine | Admitting: Internal Medicine

## 2021-11-22 ENCOUNTER — Encounter (HOSPITAL_COMMUNITY): Payer: HMO

## 2021-11-22 ENCOUNTER — Other Ambulatory Visit: Payer: Self-pay

## 2021-11-22 DIAGNOSIS — I739 Peripheral vascular disease, unspecified: Secondary | ICD-10-CM | POA: Insufficient documentation

## 2021-11-22 LAB — BASIC METABOLIC PANEL
BUN/Creatinine Ratio: 16 (ref 12–28)
BUN: 34 mg/dL — ABNORMAL HIGH (ref 8–27)
CO2: 28 mmol/L (ref 20–29)
Calcium: 11.3 mg/dL — ABNORMAL HIGH (ref 8.7–10.3)
Chloride: 94 mmol/L — ABNORMAL LOW (ref 96–106)
Creatinine, Ser: 2.18 mg/dL — ABNORMAL HIGH (ref 0.57–1.00)
Glucose: 199 mg/dL — ABNORMAL HIGH (ref 70–99)
Potassium: 4.3 mmol/L (ref 3.5–5.2)
Sodium: 137 mmol/L (ref 134–144)
eGFR: 24 mL/min/{1.73_m2} — ABNORMAL LOW (ref >59)

## 2021-11-22 LAB — TSH: TSH: 1.12 u[IU]/mL (ref 0.450–4.500)

## 2021-11-22 NOTE — Progress Notes (Signed)
ABI's have been completed. Preliminary results can be found in CV Proc through chart review.   11/22/21 4:37 PM [name] RVT

## 2021-11-26 ENCOUNTER — Other Ambulatory Visit: Payer: Self-pay | Admitting: Family Medicine

## 2021-11-26 ENCOUNTER — Other Ambulatory Visit: Payer: Self-pay

## 2021-11-26 ENCOUNTER — Ambulatory Visit (HOSPITAL_COMMUNITY)
Admission: RE | Admit: 2021-11-26 | Discharge: 2021-11-26 | Disposition: A | Discharge status: Home / Self Care | Payer: HMO | Source: Ambulatory Visit | Attending: Family Medicine | Admitting: Family Medicine

## 2021-11-26 DIAGNOSIS — E119 Type 2 diabetes mellitus without complications: Secondary | ICD-10-CM | POA: Diagnosis not present

## 2021-11-26 DIAGNOSIS — I110 Hypertensive heart disease with heart failure: Secondary | ICD-10-CM | POA: Diagnosis not present

## 2021-11-26 DIAGNOSIS — I5022 Chronic systolic (congestive) heart failure: Secondary | ICD-10-CM | POA: Insufficient documentation

## 2021-11-26 DIAGNOSIS — I252 Old myocardial infarction: Secondary | ICD-10-CM | POA: Insufficient documentation

## 2021-11-26 LAB — ECHOCARDIOGRAM COMPLETE
Area-P 1/2: 3.12 cm2
S' Lateral: 4.3 cm

## 2021-11-27 ENCOUNTER — Other Ambulatory Visit: Payer: Self-pay

## 2021-11-30 ENCOUNTER — Telehealth: Payer: Self-pay | Admitting: Family Medicine

## 2021-11-30 NOTE — Telephone Encounter (Signed)
Called ms. Rika and got her in on 4/11 @3 

## 2021-11-30 NOTE — Telephone Encounter (Signed)
-----   Message from [name], MD sent at 11/25/2021  9:35 AM EST ----- Please see about setting follow-up office visit this spring.  Thanks.  Brigitte Pulse

## 2021-12-03 NOTE — Telephone Encounter (Signed)
Noted. Thanks.

## 2021-12-05 ENCOUNTER — Telehealth: Payer: Self-pay | Admitting: Internal Medicine

## 2021-12-05 ENCOUNTER — Other Ambulatory Visit (HOSPITAL_COMMUNITY): Payer: Self-pay | Admitting: Cardiology

## 2021-12-05 DIAGNOSIS — I739 Peripheral vascular disease, unspecified: Secondary | ICD-10-CM

## 2021-12-05 DIAGNOSIS — G8929 Other chronic pain: Secondary | ICD-10-CM

## 2021-12-05 NOTE — Telephone Encounter (Signed)
Patient would like to know if Dr Caryl Comes can send in a referral for Dr Alvan Dame at [facility] for her knee ?Please call to discuss  ?

## 2021-12-06 NOTE — Telephone Encounter (Signed)
Attempted to call the patient twice. ?The phone would ring and the patient would pick up but she could not hear me. ? ?Will call back a little later.  ?

## 2021-12-06 NOTE — Telephone Encounter (Signed)
I spoke with the patient. ?She is aware I received her message and will discuss with Dr. Caryl Comes if he is ok with referring her to Dr. Alvan Dame at [facility] for right knee pain/ possible surgery. ? ?The patient discussed briefly with Dr. Caryl Comes in the office on 11/20/21. ? ?She is aware I will review with Dr. Caryl Comes and call her back, most likely early next week. ? ?The patient voices understanding and is agreeable.  ? ? ?

## 2021-12-07 DIAGNOSIS — G4733 Obstructive sleep apnea (adult) (pediatric): Secondary | ICD-10-CM | POA: Diagnosis not present

## 2021-12-07 DIAGNOSIS — I5022 Chronic systolic (congestive) heart failure: Secondary | ICD-10-CM | POA: Diagnosis not present

## 2021-12-07 DIAGNOSIS — M86171 Other acute osteomyelitis, right ankle and foot: Secondary | ICD-10-CM | POA: Diagnosis not present

## 2021-12-07 DIAGNOSIS — J189 Pneumonia, unspecified organism: Secondary | ICD-10-CM | POA: Diagnosis not present

## 2021-12-08 ENCOUNTER — Other Ambulatory Visit (HOSPITAL_COMMUNITY): Payer: Self-pay | Admitting: Internal Medicine

## 2021-12-08 ENCOUNTER — Other Ambulatory Visit: Payer: Self-pay | Admitting: Family Medicine

## 2021-12-10 NOTE — Telephone Encounter (Signed)
Refill request for GABAPENTIN 600 MG TAB ? ?LOV - 09/24/21 ?Next OV - 01/15/22 ?Last refill - 07/23/21 #270/1 ? ?

## 2021-12-11 ENCOUNTER — Encounter: Payer: HMO | Admitting: Internal Medicine

## 2021-12-13 DIAGNOSIS — M25461 Effusion, right knee: Secondary | ICD-10-CM | POA: Diagnosis not present

## 2021-12-13 DIAGNOSIS — I509 Heart failure, unspecified: Secondary | ICD-10-CM | POA: Diagnosis not present

## 2021-12-13 DIAGNOSIS — N183 Chronic kidney disease, stage 3 unspecified: Secondary | ICD-10-CM | POA: Diagnosis not present

## 2021-12-13 DIAGNOSIS — G8929 Other chronic pain: Secondary | ICD-10-CM | POA: Diagnosis not present

## 2021-12-13 DIAGNOSIS — Z89422 Acquired absence of other left toe(s): Secondary | ICD-10-CM | POA: Diagnosis not present

## 2021-12-13 DIAGNOSIS — I480 Paroxysmal atrial fibrillation: Secondary | ICD-10-CM | POA: Diagnosis not present

## 2021-12-13 DIAGNOSIS — Z89421 Acquired absence of other right toe(s): Secondary | ICD-10-CM | POA: Diagnosis not present

## 2021-12-13 DIAGNOSIS — Z89412 Acquired absence of left great toe: Secondary | ICD-10-CM | POA: Diagnosis not present

## 2021-12-13 DIAGNOSIS — Z794 Long term (current) use of insulin: Secondary | ICD-10-CM | POA: Diagnosis not present

## 2021-12-13 DIAGNOSIS — D692 Other nonthrombocytopenic purpura: Secondary | ICD-10-CM | POA: Diagnosis not present

## 2021-12-13 DIAGNOSIS — E1122 Type 2 diabetes mellitus with diabetic chronic kidney disease: Secondary | ICD-10-CM | POA: Diagnosis not present

## 2021-12-13 DIAGNOSIS — D6869 Other thrombophilia: Secondary | ICD-10-CM | POA: Diagnosis not present

## 2021-12-13 NOTE — Telephone Encounter (Signed)
Referral paperwork faxed to Dr. Alvan Dame at [phone]. ?Fax confirmation received. ? ?I have called and notified the patient the referral has been sent. ?She was very appreciative of this. ? ?I also advised her Dr. Caryl Comes wanted me to see how her edema is doing. ?Per the patient, she has had no change in her swelling since she was seen in the office (no worse, but no better). ?She is unable to stand on the scale to weigh at home. ? ?Breathing is stable. ? ?I advised the patient I would send to Dr. Caryl Comes as an FYI, but no other changes will be made at this time. ? ?The patient voices understanding and is agreeable. ? ? ?

## 2021-12-13 NOTE — Telephone Encounter (Signed)
Reviewed surgical referral with Dr. Caryl Comes- he is ok making this referral. ? ? ?

## 2021-12-18 ENCOUNTER — Other Ambulatory Visit (HOSPITAL_COMMUNITY): Payer: Self-pay | Admitting: Internal Medicine

## 2021-12-18 ENCOUNTER — Other Ambulatory Visit: Payer: Self-pay | Admitting: Cardiovascular Disease

## 2021-12-25 ENCOUNTER — Encounter: Payer: HMO | Admitting: Vascular Surgery

## 2021-12-28 ENCOUNTER — Telehealth: Payer: Self-pay

## 2021-12-28 NOTE — Chronic Care Management (AMB) (Signed)
? ? ?Chronic Care Management ?Pharmacy Assistant  ? ?Name: [name]  MRN: [id number] DOB: 1955/12/17 ? ? ? ?Reason for Encounter:Reminder Call ?  ?Conditions to be addressed/monitored: ?CAD, HTN, HLD, and DMII ? ? ?Medications: ?Outpatient Encounter Medications as of 12/28/2021  ?Medication Sig  ? acetaminophen (TYLENOL) 500 MG tablet Take 1,500 mg by mouth every 6 (six) hours as needed for moderate pain or headache.  ? amiodarone (PACERONE) 200 MG tablet TAKE 1 TABLET BY MOUTH ONCE DAILY  ? aspirin EC 81 MG EC tablet Take 1 tablet (81 mg total) by mouth daily. Swallow whole.  ? atorvastatin (LIPITOR) 10 MG tablet Take 1 tablet (10 mg total) by mouth daily.  ? calcitRIOL (ROCALTROL) 0.5 MCG capsule Take 0.5 mcg by mouth every morning.  ? Calcium Carb-Cholecalciferol (CALCIUM CARBONATE-VITAMIN D3) 600-400 MG-UNIT TABS Take 1 tablet by mouth 2 (two) times a day.  ? carvedilol (COREG) 3.125 MG tablet TAKE 1 TABLET BY MOUTH TWICE (2) DAILY WITH A MEAL  ? clopidogrel (PLAVIX) 75 MG tablet Take 1 tablet (75 mg total) by mouth daily.  ? Continuous Blood Gluc Receiver (FREESTYLE LIBRE 2 READER) DEVI Use to check sugar multiple times per day as directed.  H/o DM2, treated with insulin.  ? Continuous Blood Gluc Sensor (FREESTYLE LIBRE 2 SENSOR) MISC Use to check sugar multiple times per day as directed.  H/o DM2, treated with insulin.  ? DULoxetine (CYMBALTA) 30 MG capsule Take 1 capsule (30 mg total) by mouth at bedtime.  ? ferrous sulfate 325 (65 FE) MG tablet Take 325 mg by mouth 2 (two) times daily with a meal.  ? gabapentin (NEURONTIN) 600 MG tablet TAKE 1 TABLET BY IN THE MORNING AND 2 ATBEDTIME  ? Insulin Aspart FlexPen (NOVOLOG) 100 UNIT/ML INJECT 0.05 ML (5 UNITS TOTAL) INTO THE SKIN 3 TIMES DAILY BEFORE MEALS (Patient taking differently: Inject 5 Units into the skin in the morning, at noon, and at bedtime.)  ? Insulin Pen Needle (PEN NEEDLES) 32G X 6 MM MISC 1 each by Does not apply route 4 (four) times daily.   ? isosorbide mononitrate (IMDUR) 30 MG 24 hr tablet TAKE 1 TABLET BY MOUTH ONCE DAILY  ? LANTUS SOLOSTAR 100 UNIT/ML Solostar Pen INJECT 22 UNITS UNDER THE SKIN DAILY.  ? levothyroxine (SYNTHROID) 50 MCG tablet TAKE 1 TABLET BY MOUTH ONCE DAILY BEFOREBREAKFAST  ? losartan (COZAAR) 25 MG tablet TAKE 1/2 TABLET BY MOUTH DAILY  ? Multiple Vitamin (MULTIVITAMIN WITH MINERALS) TABS tablet Take 1 tablet by mouth every morning.  ? nitroGLYCERIN (NITROSTAT) 0.4 MG SL tablet Place 1 tablet (0.4 mg total) under the tongue every 5 (five) minutes as needed for chest pain.  ? Omega-3 Fatty Acids (FISH OIL) 1000 MG CAPS Take 1,000 mg by mouth 2 (two) times a day.  ? Semaglutide,0.25 or 0.5MG /DOS, (OZEMPIC, 0.25 OR 0.5 MG/DOSE,) 2 MG/1.5ML SOPN Inject 0.25 mg into the skin once a week.  ? torsemide (DEMADEX) 20 MG tablet Take 1 tablet (20 mg total) by mouth daily.  ? traMADol (ULTRAM) 50 MG tablet TAKE 1 TO 2 TABLET BY MOUTH AT BEDTIME AS NEEDED  ? ?No facility-administered encounter medications on file as of 12/28/2021.  ? ?Ade Deitering Earp was contacted to remind of upcoming telephone visit with Al Corpus on 01/02/22 at 3:00pm. Patient was reminded to have any blood glucose and blood pressure readings available for review at appointment.  ? ?Message was left reminding patient of appointment. ? ? ? ?Star Rating  Drugs: ?Medication:  Last Fill: Day Supply ?Atorvastatin 10mg  09/07/21 30 ?Novolog   09/04/21 100 ?Lantus   08/04/21 68 ?Losartan 25mg  09/07/21 90 ? ?Al Corpus, CPP notified ? ?Quinlan Vollmer, CCMA ?Health concierge  ?[phone]  ? ?  ?

## 2022-01-02 ENCOUNTER — Ambulatory Visit (INDEPENDENT_AMBULATORY_CARE_PROVIDER_SITE_OTHER): Payer: HMO | Admitting: Pharmacist

## 2022-01-02 DIAGNOSIS — I5022 Chronic systolic (congestive) heart failure: Secondary | ICD-10-CM

## 2022-01-02 DIAGNOSIS — N184 Chronic kidney disease, stage 4 (severe): Secondary | ICD-10-CM

## 2022-01-02 DIAGNOSIS — E1142 Type 2 diabetes mellitus with diabetic polyneuropathy: Secondary | ICD-10-CM

## 2022-01-02 DIAGNOSIS — I10 Essential (primary) hypertension: Secondary | ICD-10-CM

## 2022-01-02 DIAGNOSIS — I2510 Atherosclerotic heart disease of native coronary artery without angina pectoris: Secondary | ICD-10-CM

## 2022-01-02 DIAGNOSIS — Z8673 Personal history of transient ischemic attack (TIA), and cerebral infarction without residual deficits: Secondary | ICD-10-CM

## 2022-01-02 NOTE — Telephone Encounter (Signed)
Patient scheduled with Dr. Alvan Dame 4-28 . ?

## 2022-01-02 NOTE — Progress Notes (Signed)
?Chronic Care Management ?Pharmacy Note ? ?01/04/2022 ?Name:  [name] MRN:  [id number] DOB:  [birth year] ? ?Summary: CCM F/U call ?-Pt has been wearing Freestyle Libre. She reports fasting BG 125-166, post-prandial BG 205-250. She never increased Ozempic to 0.5 mg after completing 0.25 mg x 1 month. ?-Since last CCM visit, kidney function has declined further (Cr 1.87 > 2.18, GFR 32 > 25); pt has follow up with PCP to discuss 4/11 ?-Pt reports she is adherent to all medications as prescribed, but claims data shows several medications are well overdue (atorvastatin, carvedilol, clopidogrel, isosorbide, levothyroxine) ? ?Recommendations/Changes made from today's visit: ?-Recommend increasing Ozempic to 0.5 mg weekly ?-Will contact pharmacy for fill dates on overdue medications to verify adherence ? ?Plan ?-[facility] Concierge will call patient 2 weeks to f/u med changes ?-Pharmacist follow up televisit scheduled for 2 months ?-PCP F/U 01/15/22 ? ? ?Subjective: ?[name] is an 66 y.o. year old female who is a primary patient of Para March, Dwana Curd, MD.  The CCM team was consulted for assistance with disease management and care coordination needs.   ? ?Engaged with patient by telephone for follow up visit in response to provider referral for pharmacy case management and/or care coordination services.  ? ?Consent to Services:  ?The patient was given information about Chronic Care Management services, agreed to services, and gave verbal consent prior to initiation of services.  Please see initial visit note for detailed documentation.  ? ?Patient Care Team: ?Joaquim Nam, MD as PCP - General (Family Medicine) ?Antonieta Iba, MD as PCP - Cardiology (Cardiology) ?Duke Salvia, MD as PCP - Electrophysiology (Cardiology) ?Bensimhon, Bevelyn Buckles, MD as PCP - Advanced Heart Failure (Cardiology) ?Nelson Chimes, MD as Consulting Physician (Ophthalmology) ?Lamont Dowdy, MD as Consulting Physician (Internal  Medicine) ?Recardo Evangelist, DPM (Inactive) as Attending Physician (Podiatry) ?Lynn Ito, MD as Consulting Physician (Infectious Diseases) ?Glendale Chard, DO as Consulting Physician (Neurology) ?Blakelee Allington, Garry Heater, [facility] as Pharmacist (Pharmacist) ? ? ?Patient has been living with her mother since her toe amputation so she can help with dressing changes.  ? ?Recent office visits: ?09/24/21 Dr Para March VV (telephone): advised chekcing pre and post-meal sugars. Rec CGM. Labs ordered (CMP, CBC, A1C, lipids) ? ?02/15/21 Para March (PCP) [facility] f/u. Osteomyelitis. Reduce Gabapentin 600 mg HS & start Novolog sliding scale. Advised to restart torsemide 20 mg daily PRN leg swelling. Recheck BMP/K in 1 week. ? ?Recent consult visits: ?11/20/21 Dr Graciela Husbands (Cardiology): f/u NICM, CHF. Decrease amiodarone to 200 mg 5x per week. Cr 2.18, GFR 24 - requires eval per PCP. ? ?11/14/21 NP Prince Rome (HF clinic): f/u HF. Labs stable. Ordered ABI, ECHO. No med changes. ? ?07/23/21 Dr Allena Katz (neurology): f/u neuropathy; reduce gabapentin to 300 mg HS due to renal function;  ? ?Following with podiatry (Dr Ardelle Anton) and wound care for L great toe amputation ? ?05/21/21 NP Prince Rome (HF clinic): restart torsemide 20 mg daily. Hold off on SGLT2i until renal function results. K 5.2, avoid K-rich foods and repeat BMP 7 days. ? ?04/02/21 12/25/20 Kolluru (Nephrology) - Chronic kidney disease, stage 4. F/u 3 mos. ?03/25/21 Graciela Husbands (Cardiology) - Nonischemic cardiomyopathy. EKG 12-lead. No med changes. ?12/25/20 Kolluru (Nephrology) - F/u Chronic kidney disease, Stage IV. Start Jardiance 10 mg. D/c Cacitriol.  F/u 3 mos. ? ?Hospital visits: ?Medication Reconciliation was completed by comparing discharge summary, patient?s EMR and Pharmacy list, and upon discussion with patient. ? ?Admitted to the ED on 10/12/21 due  to CHF exacerbation. Discharge date was 10/13/21. Discharged from [facility].   ?-IV lasix in ED ? ?Medication Changes  at Hospital Discharge: ?-Changed torsemide to 40 mg daily x 5 days, then back to 20 mg ? ?Medications that remain the same after Hospital Discharge:??  ?-All other medications will remain the same.   ? ?Admitted 02/2021 for cellulitis. She underwent amputation of left great toe 5/27. Repeat arterial studies 5/27 showed a 50-59% stenosis on the distal left popliteal stent segment, no new interventions per vascular. ID was consulted and recommended 6 weeks of abx. Tunneled central line placed (PICC avoided due to CKD) and she was discharged on Daptomycin ? ?Admitted 01/2021 for osteomyelitis of left foot after failing outpatient management. Had initial surgery (I&D, bone biopsy, and sesamoidectomy). Vascular was consulted after it was felt she was high risk for limb loss due to dampened monophasic signals in left foot on ABI. She underwent CO2 angiogram with angioplasty of 3 separate in-stent stenoses in the left superficial femoral artery, and then revision of surgery (bone resection of 5th metatarsal head and base of the prox phalanx of the 5th). Torsemide held at discharge due to worsening renal function. ? ?Objective: ? ?Lab Results  ?Component Value Date  ? CREATININE 2.18 (H) 11/20/2021  ? BUN 34 (H) 11/20/2021  ? GFR 28.39 (L) 10/03/2021  ? GFRNONAA 25 (L) 11/14/2021  ? GFRAA 23 (L) 11/05/2019  ? NA 137 11/20/2021  ? K 4.3 11/20/2021  ? CALCIUM 11.3 (H) 11/20/2021  ? CO2 28 11/20/2021  ? GLUCOSE 199 (H) 11/20/2021  ? ? ?Lab Results  ?Component Value Date/Time  ? HGBA1C 8.1 (H) 10/03/2021 11:37 AM  ? HGBA1C 6.8 (H) 04/23/2021 12:23 PM  ? FRUCTOSAMINE 305 (H) 12/24/2018 05:53 PM  ? GFR 28.39 (L) 10/03/2021 11:37 AM  ? GFR 26.41 (L) 02/15/2021 03:29 PM  ? MICROALBUR 6.9 08/06/2016 02:32 PM  ? MICROALBUR 10.0 12/12/2015 03:48 PM  ?  ?Last diabetic Eye exam: No results found for: HMDIABEYEEXA  ?Last diabetic Foot exam: No results found for: HMDIABFOOTEX  ? ?Lab Results  ?Component Value Date  ? CHOL 96 10/03/2021  ?  HDL 38.90 (L) 10/03/2021  ? LDLCALC 43 10/03/2021  ? TRIG 70.0 10/03/2021  ? CHOLHDL 2 10/03/2021  ? ? ? ?  Latest Ref Rng & Units 10/03/2021  ? 11:37 AM 03/03/2021  ?  1:31 AM 02/26/2021  ?  8:00 PM  ?Hepatic Function  ?Total Protein 6.0 - 8.3 g/dL 6.4   6.1   7.2    ?Albumin 3.5 - 5.2 g/dL 3.2   3.0   3.5    ?AST 0 - 37 U/L 10   16   17     ?ALT 0 - 35 U/L 7   14   15     ?Alk Phosphatase 39 - 117 U/L 82   55   64    ?Total Bilirubin 0.2 - 1.2 mg/dL 0.3   0.4   0.7    ? ? ?Lab Results  ?Component Value Date/Time  ? TSH 1.120 11/20/2021 12:32 PM  ? TSH 1.160 03/27/2021 12:47 PM  ? FREET4 1.19 03/22/2019 11:59 AM  ? FREET4 1.1 04/16/2016 04:50 PM  ? ? ? ?  Latest Ref Rng & Units 10/12/2021  ?  5:54 PM 10/03/2021  ? 11:37 AM 04/25/2021  ? 11:40 AM  ?CBC  ?WBC 4.0 - 10.5 K/uL 8.0   8.8   7.5    ?Hemoglobin 12.0 -  15.0 g/dL 9.4   9.1   08.6    ?Hematocrit 36.0 - 46.0 % 31.0   28.4   38.1    ?Platelets 150 - 400 K/uL 309   257.0   185    ? ? ?Lab Results  ?Component Value Date/Time  ? VD25OH 32 06/01/2012 10:00 AM  ? ?CHA2DS2-VASc Score = 8  ?The patient's score is based upon: ?CHF History: 1 ?HTN History: 1 ?Diabetes History: 1 ?Stroke History: 2 ?Vascular Disease History: 1 ?Age Score: 1 ?Gender Score: 1 ?    ?Clinical ASCVD: Yes  ?The ASCVD Risk score (Arnett DK, et al., 2019) failed to calculate for the following reasons: ?  The patient has a prior MI or stroke diagnosis   ? ? ?  03/30/2021  ? 10:46 AM 03/21/2021  ?  9:24 AM 09/28/2020  ? 10:36 AM  ?Depression screen PHQ 2/9  ?Decreased Interest 0 0 0  ?Down, Depressed, Hopeless  0 1  ?PHQ - 2 Score 0 0 1  ?Altered sleeping  0 0  ?Tired, decreased energy  0 3  ?Change in appetite  0 3  ?Feeling bad or failure about yourself   0 0  ?Trouble concentrating  0 0  ?Moving slowly or fidgety/restless  0 0  ?Suicidal thoughts  0 0  ?PHQ-9 Score  0 7  ?Difficult doing work/chores  Not difficult at all Not difficult at all  ?  ?Social History  ? ?Tobacco Use  ?Smoking Status Never   ?Smokeless Tobacco Never  ? ?BP Readings from Last 3 Encounters:  ?01/03/22 (!) 106/54  ?11/20/21 (!) 110/54  ?11/14/21 130/70  ? ?Pulse Readings from Last 3 Encounters:  ?01/03/22 64  ?11/20/21 64  ?02/

## 2022-01-03 ENCOUNTER — Encounter: Payer: HMO | Admitting: Vascular Surgery

## 2022-01-03 ENCOUNTER — Encounter: Payer: Self-pay | Admitting: Vascular Surgery

## 2022-01-03 ENCOUNTER — Ambulatory Visit: Payer: HMO | Admitting: Vascular Surgery

## 2022-01-03 VITALS — BP 106/54 | HR 64 | Temp 97.0°F | Resp 20 | Ht 68.0 in | Wt 280.0 lb

## 2022-01-03 DIAGNOSIS — I739 Peripheral vascular disease, unspecified: Secondary | ICD-10-CM | POA: Diagnosis not present

## 2022-01-03 NOTE — Progress Notes (Signed)
? ?ASSESSMENT & PLAN  ? ?PERIPHERAL ARTERIAL DISEASE: This patient is undergone previous intervention on the left leg at [facility] and then most recently balloon angioplasty of stent in stent stenoses on the left on 02/02/2021.  She is being considered for a right total knee replacement and was sent for preoperative vascular evaluation.  She has a palpable right dorsalis pedis pulse and biphasic Doppler signals in the right foot with an ABI of 100% and a toe pressure of 110 mmHg.  From a vascular standpoint she should have adequate circulation for healing her right knee surgery.  She does have evidence of some underlying venous insufficiency and will need aggressive DVT prophylaxis and mobilization postoperatively.  In order to continue to follow her peripheral arterial disease I have ordered follow-up ABIs in 9 months and I will see her back at that time.  She knows to call sooner if she has problems. ? ?REASON FOR CONSULT:   ? ?Peripheral arterial disease.  The consult is requested by Pierre Bali ? ?HPI:  ? ?[name] is a 66 y.o. female who is previously had a superficial femoral artery stents placed at [facility]. She had previously presented with a wound on her left foot last year.  In April of last year she underwent an arteriogram and had several areas of in-stent stenosis which were addressed with balloon angioplasty.  She also had some narrowing in her popliteal artery at the level of the knee.  I felt that she likely had adequate circulation to heal her left foot wound.  Of note given her chronic kidney disease the right lower extremity was not studied. ? ?Since I saw her last the wound on the left foot has healed.  Her main complaint now is severe arthritis of the right knee and she is being considered for a a knee replacement on the right.  We have been asked to evaluate her peripheral arterial disease prior to knee surgery. ? ?Her activity is  limited by her knee pain so I really do not get any history of claudication.  She denies any history of rest pain.  Again the wound on the left foot has healed. ? ?Past Medical History:  ?Diagnosis Date  ? Acute myocardial infarction, subendocardial infarction, initial episode of care [facility]) 07/21/2012  ? Acute osteomyelitis involving ankle and foot ([place]) 02/06/2015  ? Acute systolic heart failure ([place]) 07/21/2012  ? New onset 07/19/12; admission to [facility] ED. Elevated Troponins.  S/p 2D-echo with EF 20-25%.  S/p cardiac catheterization with stenting LAD.  Repeat 2D-echo 10/2011 with improved EF of 35%.   ? Anemia   ? Automatic implantable cardioverter-defibrillator in situ   ? a. MDT CRT-D 06/2014, SN: [id number]  .removed in feb 2020  ? CAD (coronary artery disease)   ? a. cardiac cath 101/04/2012: PCI/DES to chronically occluded mLAD, consideration PCI to diag branch in 4 weeks.   ? Cataract   ? Chicken pox   ? Chronic kidney disease   ? Chronic systolic CHF (congestive heart failure) ([place])   ? a. mixed ICM & NICM; b. EF 20-25% by echo 07/2012, mid-dist 2/3 of LV sev HK/AK, mild MR. echo 10/2012: EF 30-35%, sev HK ant-septal & inf walls, GR1DD, mild MR, PASP 33. c. echo 02/2013: EF 30%, GR1DD, mild MR. echo 04/2014: EF 30%, Septal-lat dyssynchrony, global HK, inf AK, GR1DD, mild MR. d. echo 10/2014: EF50-55%, WM nl, GR1DD, septal mild paradox. e. echo 02/2015: EF  50-55%, wm   ? Depression   ? Heart attack ([place])   ? Heel ulcer ([place]) 04/27/2015  ? History of blood transfusion ~ 2011  ? "plasma; had neuropathy; couldn't walk"  ? Hypertension   ? Hypothyroidism   ? LBBB (left bundle branch block)   ? Neuromuscular disorder ([place])   ? Neuropathy 2011  ? Obesity, unspecified   ? OSA on CPAP   ? Moderate with AHI 23/hr and now on CPAP at 16cm H2O.  Her DME is [facility]  ? Pure hypercholesterolemia   ? Sepsis ([place]) 09/2018  ? Type II diabetes mellitus ([place])   ? Unspecified vitamin D deficiency   ? ? ?Family History  ?Problem  Relation Age of Onset  ? Diabetes Mother   ? Hypertension Mother   ? Arthritis Mother   ?     knees, lumbar DDD, cervical DDD  ? Cancer Father   ?     prostate,skin,lymphoma.  ? Cancer Brother 57  ?     bladder cancer; non-smoker  ? Diabetes Maternal Grandmother   ? Heart disease Maternal Grandmother   ? COPD Maternal Grandmother   ? Diabetes Paternal Grandmother   ? Obesity Brother   ? Diabetes Son   ? Hypertension Son   ? Cancer Maternal Grandfather   ? Diabetes Paternal Grandfather   ? ? ?SOCIAL HISTORY: ?Social History  ? ?Tobacco Use  ? Smoking status: Never  ? Smokeless tobacco: Never  ?Substance Use Topics  ? Alcohol use: No  ?  Alcohol/week: 0.0 standard drinks  ? ? ?Allergies  ?Allergen Reactions  ? Heparin   ?  Can not remember reaction  ? Nsaids   ?  Kidney disease  ? ? ?Current Outpatient Medications  ?Medication Sig Dispense Refill  ? acetaminophen (TYLENOL) 500 MG tablet Take 1,500 mg by mouth every 6 (six) hours as needed for moderate pain or headache.    ? amiodarone (PACERONE) 200 MG tablet TAKE 1 TABLET BY MOUTH ONCE DAILY 90 tablet 1  ? aspirin EC 81 MG EC tablet Take 1 tablet (81 mg total) by mouth daily. Swallow whole. 30 tablet 11  ? atorvastatin (LIPITOR) 10 MG tablet Take 1 tablet (10 mg total) by mouth daily. 30 tablet 11  ? calcitRIOL (ROCALTROL) 0.5 MCG capsule Take 0.5 mcg by mouth every morning.    ? Calcium Carb-Cholecalciferol (CALCIUM CARBONATE-VITAMIN D3) 600-400 MG-UNIT TABS Take 1 tablet by mouth 2 (two) times a day.    ? carvedilol (COREG) 3.125 MG tablet TAKE 1 TABLET BY MOUTH TWICE (2) DAILY WITH A MEAL 180 tablet 2  ? clopidogrel (PLAVIX) 75 MG tablet Take 1 tablet (75 mg total) by mouth daily. 30 tablet 3  ? Continuous Blood Gluc Receiver (FREESTYLE LIBRE 2 READER) DEVI Use to check sugar multiple times per day as directed.  H/o DM2, treated with insulin. 1 each 0  ? Continuous Blood Gluc Sensor (FREESTYLE LIBRE 2 SENSOR) MISC Use to check sugar multiple times per day as  directed.  H/o DM2, treated with insulin. 2 each 12  ? DULoxetine (CYMBALTA) 30 MG capsule Take 1 capsule (30 mg total) by mouth at bedtime. 90 capsule 3  ? ferrous sulfate 325 (65 FE) MG tablet Take 325 mg by mouth 2 (two) times daily with a meal.    ? gabapentin (NEURONTIN) 600 MG tablet TAKE 1 TABLET BY IN THE MORNING AND 2 ATBEDTIME 270 tablet 1  ? Insulin Aspart FlexPen (NOVOLOG) 100 UNIT/ML INJECT 0.05  ML (5 UNITS TOTAL) INTO THE SKIN 3 TIMES DAILY BEFORE MEALS (Patient taking differently: Inject 5 Units into the skin in the morning, at noon, and at bedtime.) 15 mL 1  ? Insulin Pen Needle (PEN NEEDLES) 32G X 6 MM MISC 1 each by Does not apply route 4 (four) times daily. 150 each 11  ? isosorbide mononitrate (IMDUR) 30 MG 24 hr tablet TAKE 1 TABLET BY MOUTH ONCE DAILY 90 tablet 3  ? LANTUS SOLOSTAR 100 UNIT/ML Solostar Pen INJECT 22 UNITS UNDER THE SKIN DAILY. 15 mL 5  ? levothyroxine (SYNTHROID) 50 MCG tablet TAKE 1 TABLET BY MOUTH ONCE DAILY BEFOREBREAKFAST 90 tablet 1  ? losartan (COZAAR) 25 MG tablet TAKE 1/2 TABLET BY MOUTH DAILY 45 tablet 4  ? Multiple Vitamin (MULTIVITAMIN WITH MINERALS) TABS tablet Take 1 tablet by mouth every morning.    ? nitroGLYCERIN (NITROSTAT) 0.4 MG SL tablet Place 1 tablet (0.4 mg total) under the tongue every 5 (five) minutes as needed for chest pain. 25 tablet 3  ? Omega-3 Fatty Acids (FISH OIL) 1000 MG CAPS Take 1,000 mg by mouth 2 (two) times a day.    ? Semaglutide,0.25 or 0.'5MG'$ /DOS, (OZEMPIC, 0.25 OR 0.5 MG/DOSE,) 2 MG/1.5ML SOPN Inject 0.25 mg into the skin once a week. 3 mL 1  ? torsemide (DEMADEX) 20 MG tablet Take 1 tablet (20 mg total) by mouth daily. 30 tablet 3  ? traMADol (ULTRAM) 50 MG tablet TAKE 1 TO 2 TABLET BY MOUTH AT BEDTIME AS NEEDED 60 tablet 2  ? ?No current facility-administered medications for this visit.  ? ? ?REVIEW OF SYSTEMS:  ?'[X]'$  denotes positive finding, '[ ]'$  denotes negative finding ?Cardiac  Comments:  ?Chest pain or chest pressure:     ?Shortness of breath upon exertion:    ?Short of breath when lying flat:    ?Irregular heart rhythm:    ?    ?Vascular    ?Pain in calf, thigh, or hip brought on by ambulation:    ?Pain in feet at night that wakes you up from y

## 2022-01-04 DIAGNOSIS — Z7984 Long term (current) use of oral hypoglycemic drugs: Secondary | ICD-10-CM

## 2022-01-04 DIAGNOSIS — Z794 Long term (current) use of insulin: Secondary | ICD-10-CM

## 2022-01-04 DIAGNOSIS — N184 Chronic kidney disease, stage 4 (severe): Secondary | ICD-10-CM | POA: Diagnosis not present

## 2022-01-04 DIAGNOSIS — I4891 Unspecified atrial fibrillation: Secondary | ICD-10-CM

## 2022-01-04 DIAGNOSIS — I5030 Unspecified diastolic (congestive) heart failure: Secondary | ICD-10-CM | POA: Diagnosis not present

## 2022-01-04 DIAGNOSIS — I4729 Other ventricular tachycardia: Secondary | ICD-10-CM | POA: Diagnosis not present

## 2022-01-04 DIAGNOSIS — E1142 Type 2 diabetes mellitus with diabetic polyneuropathy: Secondary | ICD-10-CM

## 2022-01-04 DIAGNOSIS — I130 Hypertensive heart and chronic kidney disease with heart failure and stage 1 through stage 4 chronic kidney disease, or unspecified chronic kidney disease: Secondary | ICD-10-CM | POA: Diagnosis not present

## 2022-01-04 DIAGNOSIS — E1122 Type 2 diabetes mellitus with diabetic chronic kidney disease: Secondary | ICD-10-CM

## 2022-01-04 NOTE — Patient Instructions (Signed)
Visit Information ? ?Phone number for Pharmacist: [phone] ? ? Goals Addressed   ?None ?  ? ? ?Care Plan : [place]  ?Updates made by [name], RPH since 01/04/2022 12:00 AM  ?  ? ?Problem: Hypertension, Hyperlipidemia, Diabetes, Atrial Fibrillation, Heart Failure, Coronary Artery Disease, and Chronic Kidney Disease   ?Priority: High  ?  ? ?Long-Range Goal: Disease management   ?Start Date: 06/29/2021  ?Expected End Date: 06/29/2022  ?This Visit's Progress: On track  ?Recent Progress: Not on track  ?Priority: High  ?Note:   ?Current Barriers:  ?Unable to independently monitor therapeutic efficacy ?Unable to maintain control of DM, HF ?  ?Pharmacist Clinical Goal(s):  ?Patient will achieve adherence to monitoring guidelines and medication adherence to achieve therapeutic efficacy ?adhere to plan to optimize therapeutic regimen for DM, HF as evidenced by report of adherence to recommended medication management changes through collaboration with PharmD and provider.  ?  ?Interventions: ?1:1 collaboration with [name], MD regarding development and update of comprehensive plan of care as evidenced by provider attestation and co-signature ?Inter-disciplinary care team collaboration (see longitudinal plan of care) ?Comprehensive medication review performed; medication list updated in electronic medical record ?  ?Hyperlipidemia: (LDL goal < 70) ?-Controlled - LDL is a goal; pt reports compliance with medications as prescribed; she endorses some bleeding issues - when she nicks herself it takes longer to stop bleeding, she denies blood in urine/stool ?-Hx CAD - MI 07/2012 w/ stent; Hx stroke ?-Current treatment: ?Atorvastatin 10 mg daily - Appropriate, Effective, Safe, Accessible ?Isosorbide MN 30 mg daily - Appropriate, Effective, Safe, Accessible ?Nitroglycerin 0.4 mg SL prn - Appropriate, Effective, Safe, Accessible ?Clopidogrel 75 mg daily HS - Appropriate, Effective, Safe,  Accessible ?Aspirin 81 mg daily - Appropriate, Effective, Safe, Accessible ?-Educated on Cholesterol goals; Benefits of statin for ASCVD risk reduction; ?-Counseled on benefits of clopidogrel/aspirin for stent patency; discussed nuisance bleeding vs major bleeding; counseled on signs/symptoms of major bleeding and when to seek emergency care ?-Recommended to continue current medication ?  ?Diabetes (A1c goal <7%) ?-Uncontrolled, improving- A1c 8.1% (09/2021) above goal; pt endorses compliance with insulin regimen as prescribed and started Ozempic ~2 months ago, she is still on 0.25 mg dose due to missing f/u appt last month ?-she reports she typically takes Novolog twice a day since she eats 2 meals per day - usually ~5 units in AM, 6-7 units with PM meal ?-Pt reports rare hypoglycemia; she uses peanut butter for hypoglycemic episodes ?-Hx toe amputation, osteomyelitis ?-Home BG (via Friend) ?Fasting: 125-166; 79 ?After breakfast: 201, 242, 242, (breakfast honey nut cheerios) ?-Current medications: ?Lantus 22 units daily HS -Appropriate, Effective, Safe, Accessible ?Novolog 5-10 units TID per sliding scale - usually BID -Appropriate, Effective, Safe, Accessible ?Ozempic 0.25 mg weekly -Appropriate, Query Effective ?Freestyle Libre 2 ?-Medications previously tried: Musician, glimepiride, Jardiance, metformin, Januvia ?-Discussed fasting BG are largely at goal, post-prandial still above goal, which is to be expected as she is still on subtherapeutic dose of Ozempic ?-Recommended to increase Ozempic to 0.5 mg weekly ?  ?Atrial [place]/NSVT/AFIB (Goal: prevent stroke and major bleeding) ?-Controlled - pt reports palpitations have improved since being on amiodarone ?-Clarified Afib dx previously - pt had 1 hr of SCAF on device, no repeat events; cardiology has not recommended anticoagulation ?-CHADSVASC: 8; per chart review patient has never been on anticoagulation for Afib (was briefly on Eliquis for a DVT  in 2016); she is currently on DAPT for hx CAD  w/ stent ?-Current treatment: ?Amiodarone 200 mg x5 days/week - Appropriate, Effective, Safe, Accessible ?Carvedilol 3.125 mg AM -Appropriate, Effective, Safe, Accessible ?-Medications previously tried: Eliquis (for DVT, 2016) ?-Counseled on benefits of rate/rhythm control drugs to prevent tachycardia ?-Recommended to continue current medication ?  ?Heart Failure (Goal: manage symptoms and prevent exacerbations) ?-Improved ?-Current home BP/HR readings: 139/80, HR high 50s-low 60s ?-Last ejection fraction: 50-55% (Date: 11/26/21) ?-HF type: Diastolic; NYHA Class: I-II ?-Current treatment: ?Carvedilol 3.125 mg BID -Appropriate, Effective, Safe, Accessible ?Isosorbide MN 30 mg daily -Appropriate, Effective, Safe, Accessible ?Losartan 25 mg - 1/2 tab daily -Appropriate, Effective, Safe, Accessible ?Torsemide 20 mg daily -Appropriate, Effective, Safe, Accessible ?-Medications previously tried: Jardiance 10 mg ?-Educated on Benefits of medications for managing symptoms and prolonging life; Importance of blood pressure control ?-Recommend to continue current medication ?  ?CKD stage 4 / HyperPTH (Goal: prevent progression) ?-Worsening - most recent GFR is worse (36 > 28>25 from Aug to Dec to Feb 2023), now in Stage 4 CKD ?-Current treatment  ?Calcitriol 0.5 mg daily AM -Appropriate, Effective, Safe, Accessible ?Ferrous sulfate 325 mg BID -Appropriate, Effective, Safe, Accessible ?Losartan 25 mg - 1/2 tab daily -Appropriate, Effective, Safe, Accessible ?-Counseled on maintaining adequate hydration; importance of BP and DM control to prevent CKD progression; avoidance of NSAIDs and other nephrotoxins ?-Recommended to continue current medication; f/u with PCP ?  ?Health Maintenance ?-Vaccine gaps: Shingrix, covid booster, flu ?-Advised to get Flu vaccine as soon as able ?  ?Patient Goals/Self-Care Activities ?Patient will:  ?- take medications as prescribed ?-focus on medication  adherence by pill box ?-check glucose daily, document, and provide at future appointments ?-check blood pressure daily, document, and provide at future appointments ?-Email Freestyle report to PharmD ?  ?  ? ?Patient verbalizes understanding of instructions and care plan provided today and agrees to view in [place]. Active MyChart status confirmed with patient.   ?Telephone follow up appointment with pharmacy team member scheduled for: 2 months ? ?Charlene Brooke, PharmD, BCACP ?Clinical Pharmacist ?[facility] ?[phone] ?  ?

## 2022-01-07 DIAGNOSIS — I5022 Chronic systolic (congestive) heart failure: Secondary | ICD-10-CM | POA: Diagnosis not present

## 2022-01-07 DIAGNOSIS — M86171 Other acute osteomyelitis, right ankle and foot: Secondary | ICD-10-CM | POA: Diagnosis not present

## 2022-01-07 DIAGNOSIS — G4733 Obstructive sleep apnea (adult) (pediatric): Secondary | ICD-10-CM | POA: Diagnosis not present

## 2022-01-07 DIAGNOSIS — J189 Pneumonia, unspecified organism: Secondary | ICD-10-CM | POA: Diagnosis not present

## 2022-01-15 ENCOUNTER — Encounter: Payer: Self-pay | Admitting: Family Medicine

## 2022-01-15 ENCOUNTER — Other Ambulatory Visit: Payer: Self-pay | Admitting: Family Medicine

## 2022-01-15 ENCOUNTER — Ambulatory Visit (INDEPENDENT_AMBULATORY_CARE_PROVIDER_SITE_OTHER): Payer: HMO | Admitting: Family Medicine

## 2022-01-15 VITALS — BP 124/68 | HR 61 | Temp 97.2°F | Ht 68.0 in

## 2022-01-15 DIAGNOSIS — E1142 Type 2 diabetes mellitus with diabetic polyneuropathy: Secondary | ICD-10-CM | POA: Diagnosis not present

## 2022-01-15 DIAGNOSIS — N184 Chronic kidney disease, stage 4 (severe): Secondary | ICD-10-CM

## 2022-01-15 DIAGNOSIS — I10 Essential (primary) hypertension: Secondary | ICD-10-CM | POA: Diagnosis not present

## 2022-01-15 DIAGNOSIS — N183 Chronic kidney disease, stage 3 unspecified: Secondary | ICD-10-CM

## 2022-01-15 MED ORDER — FERROUS SULFATE 325 (65 FE) MG PO TABS: 325.0000 mg | ORAL_TABLET | ORAL | Status: DC

## 2022-01-15 MED ORDER — OZEMPIC (0.25 OR 0.5 MG/DOSE) 2 MG/1.5ML ~~LOC~~ SOPN: 0.5000 mg | PEN_INJECTOR | SUBCUTANEOUS | Status: DC

## 2022-01-15 MED ORDER — AMIODARONE HCL 200 MG PO TABS: ORAL_TABLET | ORAL | Status: DC

## 2022-01-15 MED ORDER — TORSEMIDE 20 MG PO TABS: 20.0000 mg | ORAL_TABLET | Freq: Every day | ORAL | Status: DC

## 2022-01-15 MED ORDER — INSULIN ASPART FLEXPEN 100 UNIT/ML ~~LOC~~ SOPN: 0.0000 [IU] | PEN_INJECTOR | Freq: Three times a day (TID) | SUBCUTANEOUS | Status: DC

## 2022-01-15 NOTE — Patient Instructions (Signed)
Go to the lab on the way out.   If you have mychart we'll likely use that to update you.    ?Take care.  Glad to see you. ?We'll make follow up plans when I see your labs.  ?

## 2022-01-15 NOTE — Progress Notes (Signed)
Diabetes:  ?Using medications without difficulties: yes ?Hypoglycemic episodes: rare, at night, cautions d/w pt.   ?Hyperglycemic episodes:rare, if taking more carbs.   ?Feet problems: she has dec in sensation in BLE edema.  ?Blood Sugars averaging: 100-150 in the AMs.   ?Labs pending.  Recently increased dose of ozempic but likely hasn't had time for full effect yet.   ? ?She reported taking 1/2 tab amiodarone every other day per cards instruction.   ? ?Has ben taking iron '325mg'$  QOD.   ? ?She had to inc torsemide to 3 tabs a day, with dec in BLE edema.  F/u labs pending.   ? ?She is trying to see about options for knee pain, other than surgery.   ? ?PMH and SH reviewed ? ?Meds, vitals, and allergies reviewed.  ? ?ROS: Per HPI unless specifically indicated in ROS section  ? ?GEN: nad, alert and oriented, in wheelchair.   ?HEENT: ncat ?NECK: supple w/o LA ?CV: rrr. ?PULM: ctab, no inc wob ?ABD: soft, +bs ?EXT: trace BLE edema.  ?SKIN: no acute rash but she has warty changes on the L 2nd, 3rd, 5th fingers, near the nails.  She is going to f/u with ortho and check with them about options.   ? ?Diabetic foot exam: ?L 1st and 2nd toe amp, distal R 2nd toe amp ?No skin breakdown ?Calluses L foot, distal 3rd MT ?Normal DP pulses ?Normal sensation to light touch and monofilament ?Nails normal ? ?

## 2022-01-16 ENCOUNTER — Other Ambulatory Visit: Payer: Self-pay | Admitting: *Deleted

## 2022-01-16 ENCOUNTER — Telehealth: Payer: Self-pay

## 2022-01-16 DIAGNOSIS — I739 Peripheral vascular disease, unspecified: Secondary | ICD-10-CM

## 2022-01-16 LAB — CBC WITH DIFFERENTIAL/PLATELET
Basophils Absolute: 0.1 10*3/uL (ref 0.0–0.1)
Basophils Relative: 1.1 % (ref 0.0–3.0)
Eosinophils Absolute: 0.1 10*3/uL (ref 0.0–0.7)
Eosinophils Relative: 1.3 % (ref 0.0–5.0)
HCT: 29.3 % — ABNORMAL LOW (ref 36.0–46.0)
Hemoglobin: 9.6 g/dL — ABNORMAL LOW (ref 12.0–15.0)
Lymphocytes Relative: 21.3 % (ref 12.0–46.0)
Lymphs Abs: 1.6 10*3/uL (ref 0.7–4.0)
MCHC: 32.9 g/dL (ref 30.0–36.0)
MCV: 85.4 fl (ref 78.0–100.0)
Monocytes Absolute: 0.5 10*3/uL (ref 0.1–1.0)
Monocytes Relative: 7.2 % (ref 3.0–12.0)
Neutro Abs: 5 10*3/uL (ref 1.4–7.7)
Neutrophils Relative %: 69.1 % (ref 43.0–77.0)
Platelets: 220 10*3/uL (ref 150.0–400.0)
RBC: 3.43 Mil/uL — ABNORMAL LOW (ref 3.87–5.11)
RDW: 17.6 % — ABNORMAL HIGH (ref 11.5–15.5)
WBC: 7.3 10*3/uL (ref 4.0–10.5)

## 2022-01-16 LAB — BASIC METABOLIC PANEL
BUN: 49 mg/dL — ABNORMAL HIGH (ref 6–23)
CO2: 32 mEq/L (ref 19–32)
Calcium: 10.9 mg/dL — ABNORMAL HIGH (ref 8.4–10.5)
Chloride: 96 mEq/L (ref 96–112)
Creatinine, Ser: 3.04 mg/dL — ABNORMAL HIGH (ref 0.40–1.20)
GFR: 15.31 mL/min — ABNORMAL LOW (ref >60.00)
Glucose, Bld: 178 mg/dL — ABNORMAL HIGH (ref 70–99)
Potassium: 4.4 mEq/L (ref 3.5–5.1)
Sodium: 136 mEq/L (ref 135–145)

## 2022-01-16 LAB — HEMOGLOBIN A1C: Hgb A1c MFr Bld: 7.7 % — ABNORMAL HIGH (ref 4.6–6.5)

## 2022-01-16 LAB — IRON: Iron: 41 ug/dL — ABNORMAL LOW (ref 42–145)

## 2022-01-16 NOTE — Telephone Encounter (Signed)
New message   Pharmacy is requesting a new prescription called in for Oxempic  0.5 mg   1. Which medications need to be refilled? (please list name of each medication and dose if known) Semaglutide,0.25 or 0.'5MG'$ /DOS, (OZEMPIC, 0.25 OR 0.5 MG/DOSE,) 2 MG/1.5ML SOPN  2. Which pharmacy/location (including street and city if local pharmacy) is medication to be sent to? [place], [place]  3. Do they need a 30 day or 90 day supply? 30 days supply    Patient received clarification from the Dr. Caryl Comes amiodarone (PACERONE) 200 MG tablet one tablet by mouth 5 days a week.

## 2022-01-18 MED ORDER — GABAPENTIN 300 MG PO CAPS: 300.0000 mg | ORAL_CAPSULE | Freq: Every day | ORAL | Status: DC

## 2022-01-18 NOTE — Assessment & Plan Note (Signed)
Recheck A1c pending see notes on labs.  Continue insulin and ozempic.  We'll set s/u after I see her labs.   ?35 minutes were devoted to patient care in this encounter (this includes time spent reviewing the patient's file/history, interviewing and examining the patient, counseling/reviewing plan with patient).  ? ?

## 2022-01-18 NOTE — Assessment & Plan Note (Signed)
With recent torsemide use 3 tabs a day with recheck Cr pending.  See notes on labs.   ?

## 2022-01-18 NOTE — Telephone Encounter (Signed)
Per notes on 01/15/22 recently increased dose of Ozempic. Ok to refill and any extra refills? ? ? ?

## 2022-01-20 MED ORDER — OZEMPIC (0.25 OR 0.5 MG/DOSE) 2 MG/1.5ML ~~LOC~~ SOPN: 0.5000 mg | PEN_INJECTOR | SUBCUTANEOUS | 1 refills | Status: DC

## 2022-01-20 NOTE — Telephone Encounter (Signed)
Sent. Thanks.   

## 2022-01-21 ENCOUNTER — Telehealth: Payer: HMO | Admitting: Neurology

## 2022-01-22 ENCOUNTER — Encounter (HOSPITAL_COMMUNITY): Payer: Self-pay | Admitting: Cardiology

## 2022-01-22 NOTE — Telephone Encounter (Signed)
Patient can not pick up medication due to PA being needed. I have done PA 3 times in [email or web address] and all three times have come back as PA not needed/required. I called the pharmacy to see what it shows on their end for patient and they are still showing PA is needed. Insurance will need to be called to see if they can approve medication for patient.  ?

## 2022-01-22 NOTE — Telephone Encounter (Signed)
Patient called today to state that [name] does not have her increased dose of ozempic to fill for her ? ?Patient was supposed to take next dose on 4.17.23 ? ? ?

## 2022-01-23 ENCOUNTER — Other Ambulatory Visit: Payer: Self-pay | Admitting: Family Medicine

## 2022-01-23 DIAGNOSIS — N184 Chronic kidney disease, stage 4 (severe): Secondary | ICD-10-CM

## 2022-01-24 ENCOUNTER — Encounter: Payer: Self-pay | Admitting: Neurology

## 2022-01-29 DIAGNOSIS — Z7902 Long term (current) use of antithrombotics/antiplatelets: Secondary | ICD-10-CM | POA: Diagnosis not present

## 2022-01-29 DIAGNOSIS — Z89412 Acquired absence of left great toe: Secondary | ICD-10-CM | POA: Diagnosis not present

## 2022-01-29 DIAGNOSIS — Z794 Long term (current) use of insulin: Secondary | ICD-10-CM | POA: Diagnosis not present

## 2022-01-29 DIAGNOSIS — I480 Paroxysmal atrial fibrillation: Secondary | ICD-10-CM | POA: Diagnosis not present

## 2022-01-29 DIAGNOSIS — D6869 Other thrombophilia: Secondary | ICD-10-CM | POA: Diagnosis not present

## 2022-01-29 DIAGNOSIS — Z89422 Acquired absence of other left toe(s): Secondary | ICD-10-CM | POA: Diagnosis not present

## 2022-01-29 DIAGNOSIS — I509 Heart failure, unspecified: Secondary | ICD-10-CM | POA: Diagnosis not present

## 2022-01-29 DIAGNOSIS — N184 Chronic kidney disease, stage 4 (severe): Secondary | ICD-10-CM | POA: Diagnosis not present

## 2022-01-29 DIAGNOSIS — E1122 Type 2 diabetes mellitus with diabetic chronic kidney disease: Secondary | ICD-10-CM | POA: Diagnosis not present

## 2022-01-29 DIAGNOSIS — Z89421 Acquired absence of other right toe(s): Secondary | ICD-10-CM | POA: Diagnosis not present

## 2022-01-29 DIAGNOSIS — D692 Other nonthrombocytopenic purpura: Secondary | ICD-10-CM | POA: Diagnosis not present

## 2022-01-29 NOTE — Telephone Encounter (Signed)
PA has been approved from 01/29/22 to 10/06/22. Patient has been updated.  ?

## 2022-01-29 NOTE — Telephone Encounter (Signed)
Called patients insurance and started PA with them. Waiting on determination from insurance to come thru; stated could take 48-72 hours. PA request ID: [id number] ?

## 2022-01-30 ENCOUNTER — Other Ambulatory Visit: Payer: HMO

## 2022-01-30 ENCOUNTER — Other Ambulatory Visit (INDEPENDENT_AMBULATORY_CARE_PROVIDER_SITE_OTHER): Payer: HMO

## 2022-01-30 DIAGNOSIS — N184 Chronic kidney disease, stage 4 (severe): Secondary | ICD-10-CM | POA: Diagnosis not present

## 2022-01-30 LAB — BASIC METABOLIC PANEL
BUN: 44 mg/dL — ABNORMAL HIGH (ref 6–23)
CO2: 31 mEq/L (ref 19–32)
Calcium: 9.7 mg/dL (ref 8.4–10.5)
Chloride: 97 mEq/L (ref 96–112)
Creatinine, Ser: 2.11 mg/dL — ABNORMAL HIGH (ref 0.40–1.20)
GFR: 23.72 mL/min — ABNORMAL LOW (ref >60.00)
Glucose, Bld: 354 mg/dL — ABNORMAL HIGH (ref 70–99)
Potassium: 4.1 mEq/L (ref 3.5–5.1)
Sodium: 138 mEq/L (ref 135–145)

## 2022-02-01 ENCOUNTER — Other Ambulatory Visit: Payer: HMO

## 2022-02-01 DIAGNOSIS — M1711 Unilateral primary osteoarthritis, right knee: Secondary | ICD-10-CM | POA: Diagnosis not present

## 2022-02-04 ENCOUNTER — Ambulatory Visit (INDEPENDENT_AMBULATORY_CARE_PROVIDER_SITE_OTHER): Payer: HMO

## 2022-02-04 ENCOUNTER — Other Ambulatory Visit: Payer: Self-pay | Admitting: Family Medicine

## 2022-02-04 DIAGNOSIS — I428 Other cardiomyopathies: Secondary | ICD-10-CM | POA: Diagnosis not present

## 2022-02-04 DIAGNOSIS — N184 Chronic kidney disease, stage 4 (severe): Secondary | ICD-10-CM

## 2022-02-04 LAB — CUP PACEART REMOTE DEVICE CHECK
Battery Remaining Longevity: 54 mo
Battery Voltage: 2.98 V
Brady Statistic AP VP Percent: 0.45 %
Brady Statistic AP VS Percent: 0.01 %
Brady Statistic AS VP Percent: 99.48 %
Brady Statistic AS VS Percent: 0.06 %
Brady Statistic RA Percent Paced: 0.46 %
Brady Statistic RV Percent Paced: 99.65 %
Date Time Interrogation Session: 20230501022602
HighPow Impedance: 53 Ohm
Implantable Lead Implant Date: 20200625
Implantable Lead Implant Date: 20200625
Implantable Lead Implant Date: 20200625
Implantable Lead Location: 753858
Implantable Lead Location: 753859
Implantable Lead Location: 753860
Implantable Lead Model: 4398
Implantable Lead Model: 5076
Implantable Lead Model: 6935
Implantable Pulse Generator Implant Date: 20200625
Lead Channel Impedance Value: 215.064
Lead Channel Impedance Value: 215.064
Lead Channel Impedance Value: 218.104
Lead Channel Impedance Value: 270.667
Lead Channel Impedance Value: 270.667
Lead Channel Impedance Value: 361 Ohm
Lead Channel Impedance Value: 399 Ohm
Lead Channel Impedance Value: 399 Ohm
Lead Channel Impedance Value: 456 Ohm
Lead Channel Impedance Value: 532 Ohm
Lead Channel Impedance Value: 532 Ohm
Lead Channel Impedance Value: 551 Ohm
Lead Channel Impedance Value: 722 Ohm
Lead Channel Impedance Value: 779 Ohm
Lead Channel Impedance Value: 779 Ohm
Lead Channel Impedance Value: 836 Ohm
Lead Channel Impedance Value: 988 Ohm
Lead Channel Impedance Value: 988 Ohm
Lead Channel Pacing Threshold Amplitude: 0.625 V
Lead Channel Pacing Threshold Amplitude: 0.625 V
Lead Channel Pacing Threshold Amplitude: 2 V
Lead Channel Pacing Threshold Pulse Width: 0.4 ms
Lead Channel Pacing Threshold Pulse Width: 0.4 ms
Lead Channel Pacing Threshold Pulse Width: 0.8 ms
Lead Channel Sensing Intrinsic Amplitude: 1 mV
Lead Channel Sensing Intrinsic Amplitude: 1 mV
Lead Channel Sensing Intrinsic Amplitude: 17.625 mV
Lead Channel Sensing Intrinsic Amplitude: 17.625 mV
Lead Channel Setting Pacing Amplitude: 2 V
Lead Channel Setting Pacing Amplitude: 2 V
Lead Channel Setting Pacing Amplitude: 2.5 V
Lead Channel Setting Pacing Pulse Width: 0.4 ms
Lead Channel Setting Pacing Pulse Width: 0.8 ms
Lead Channel Setting Sensing Sensitivity: 0.3 mV

## 2022-02-05 ENCOUNTER — Encounter: Payer: Self-pay | Admitting: Internal Medicine

## 2022-02-06 DIAGNOSIS — G4733 Obstructive sleep apnea (adult) (pediatric): Secondary | ICD-10-CM | POA: Diagnosis not present

## 2022-02-06 DIAGNOSIS — J189 Pneumonia, unspecified organism: Secondary | ICD-10-CM | POA: Diagnosis not present

## 2022-02-06 DIAGNOSIS — M86171 Other acute osteomyelitis, right ankle and foot: Secondary | ICD-10-CM | POA: Diagnosis not present

## 2022-02-06 DIAGNOSIS — I5022 Chronic systolic (congestive) heart failure: Secondary | ICD-10-CM | POA: Diagnosis not present

## 2022-02-13 ENCOUNTER — Other Ambulatory Visit: Payer: Self-pay | Admitting: Family Medicine

## 2022-02-13 NOTE — Telephone Encounter (Signed)
Refill request for TRAMADOL HCL 50 MG TAB ? ?LOV - 01/15/22 ?Next OV - 04/23/22 ?Last refill - 11/13/21 #60/2 ? ?

## 2022-02-19 NOTE — Progress Notes (Signed)
Remote ICD transmission.   

## 2022-02-20 ENCOUNTER — Telehealth: Payer: Self-pay

## 2022-02-20 NOTE — Progress Notes (Signed)
? ? ?Chronic Care Management ?Pharmacy Assistant  ? ?Name: [name]  MRN: [id number] DOB: 26-Apr-1956 ? ?Reason for Encounter: CCM Counsellor) ?  ?Medications: ?Outpatient Encounter Medications as of 02/20/2022  ?Medication Sig  ? acetaminophen (TYLENOL) 500 MG tablet Take 1,500 mg by mouth every 6 (six) hours as needed for moderate pain or headache.  ? amiodarone (PACERONE) 200 MG tablet TAKE 1/2 TABLET BY MOUTH EVERY OTHER DAY  ? aspirin EC 81 MG EC tablet Take 1 tablet (81 mg total) by mouth daily. Swallow whole.  ? atorvastatin (LIPITOR) 10 MG tablet Take 1 tablet (10 mg total) by mouth daily.  ? calcitRIOL (ROCALTROL) 0.5 MCG capsule Take 0.5 mcg by mouth every morning.  ? Calcium Carb-Cholecalciferol (CALCIUM CARBONATE-VITAMIN D3) 600-400 MG-UNIT TABS Take 1 tablet by mouth 2 (two) times a day.  ? carvedilol (COREG) 3.125 MG tablet TAKE 1 TABLET BY MOUTH TWICE (2) DAILY WITH A MEAL  ? clopidogrel (PLAVIX) 75 MG tablet Take 1 tablet (75 mg total) by mouth daily.  ? Continuous Blood Gluc Receiver (FREESTYLE LIBRE 2 READER) DEVI Use to check sugar multiple times per day as directed.  H/o DM2, treated with insulin.  ? Continuous Blood Gluc Sensor (FREESTYLE LIBRE 2 SENSOR) MISC Use to check sugar multiple times per day as directed.  H/o DM2, treated with insulin.  ? DULoxetine (CYMBALTA) 30 MG capsule Take 1 capsule (30 mg total) by mouth at bedtime.  ? ferrous sulfate 325 (65 FE) MG tablet Take 1 tablet (325 mg total) by mouth every other day.  ? gabapentin (NEURONTIN) 300 MG capsule Take 1 capsule (300 mg total) by mouth daily.  ? Insulin Aspart FlexPen (NOVOLOG) 100 UNIT/ML Inject 0-10 Units into the skin 3 (three) times daily before meals. Per sliding scale  ? Insulin Pen Needle (PEN NEEDLES) 32G X 6 MM MISC 1 each by Does not apply route 4 (four) times daily.  ? isosorbide mononitrate (IMDUR) 30 MG 24 hr tablet TAKE 1 TABLET BY MOUTH ONCE DAILY  ? LANTUS SOLOSTAR 100 UNIT/ML Solostar Pen  INJECT 22 UNITS UNDER THE SKIN DAILY.  ? levothyroxine (SYNTHROID) 50 MCG tablet TAKE 1 TABLET BY MOUTH ONCE DAILY BEFOREBREAKFAST  ? losartan (COZAAR) 25 MG tablet TAKE 1/2 TABLET BY MOUTH DAILY  ? Multiple Vitamin (MULTIVITAMIN WITH MINERALS) TABS tablet Take 1 tablet by mouth every morning.  ? nitroGLYCERIN (NITROSTAT) 0.4 MG SL tablet Place 1 tablet (0.4 mg total) under the tongue every 5 (five) minutes as needed for chest pain.  ? Omega-3 Fatty Acids (FISH OIL) 1000 MG CAPS Take 1,000 mg by mouth 2 (two) times a day.  ? Semaglutide,0.25 or 0.'5MG'$ /DOS, (OZEMPIC, 0.25 OR 0.5 MG/DOSE,) 2 MG/1.5ML SOPN Inject 0.5 mg into the skin once a week.  ? torsemide (DEMADEX) 20 MG tablet Take 1-3 tablets (20-60 mg total) by mouth daily.  ? traMADol (ULTRAM) 50 MG tablet TAKE 1 TO 2 TABLET BY MOUTH AT BEDTIME AS NEEDED  ? ?No facility-administered encounter medications on file as of 02/20/2022.  ? ?[name] was contacted to remind of upcoming telephone visit with Charlene Brooke on 02/25/22 at 3:00. Patient was reminded to have any blood glucose and blood pressure readings available for review at appointment.  ? ?Message was left reminding patient of appointment. ? ?CCM referral has been placed prior to visit?  No  ? ?Star Rating Drugs: ?Medication:   Last Fill: Day Supply ?Ozempic 0.25/0.5 mg  01/31/2022 28  ?Losartan 25 mg  12/18/2021 90 ?Atorvastatin 10 mg  02/05/2022 30 ?Fill dates verified with [facility]  ? ?Charlene Brooke, CPP notified ? ?Marijean Niemann, RMA ?Clinical Pharmacy Assistant ?[phone] ? ? ? ? ?

## 2022-02-25 ENCOUNTER — Ambulatory Visit: Payer: HMO | Admitting: Pharmacist

## 2022-02-25 DIAGNOSIS — I5022 Chronic systolic (congestive) heart failure: Secondary | ICD-10-CM

## 2022-02-25 DIAGNOSIS — I10 Essential (primary) hypertension: Secondary | ICD-10-CM

## 2022-02-25 DIAGNOSIS — I2510 Atherosclerotic heart disease of native coronary artery without angina pectoris: Secondary | ICD-10-CM

## 2022-02-25 DIAGNOSIS — E7800 Pure hypercholesterolemia, unspecified: Secondary | ICD-10-CM

## 2022-02-25 DIAGNOSIS — E1142 Type 2 diabetes mellitus with diabetic polyneuropathy: Secondary | ICD-10-CM

## 2022-02-25 DIAGNOSIS — N184 Chronic kidney disease, stage 4 (severe): Secondary | ICD-10-CM

## 2022-02-25 DIAGNOSIS — I480 Paroxysmal atrial fibrillation: Secondary | ICD-10-CM

## 2022-02-25 NOTE — Progress Notes (Unsigned)
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 improve adherence? Yes  Factors that may affect medication adherence? no barriers identified Are meds synced by current pharmacy? No  Are meds delivered by current pharmacy? No  Does patient experience delays in  picking up medications due to transportation concerns? No   [facility] Reviewed: Is patient disadvantaged to use [facility]?: No  Current Rx insurance plan: H[employer] Name and location of Current pharmacy:  [facility] [place] [address] [place] [address] Phone: [phone] Fax: [phone]  [facility] services reviewed with patient today?: No  Patient requests to transfer care to [facility]?: No  Reason patient declined to change pharmacies: Loyalty to other pharmacy/Patient preference   Care Plan and Follow Up Patient Decision:  Patient  agrees to Care Plan and Follow-up.  Plan: Telephone follow up appointment with care management team member scheduled for:  3 months  Charlene Brooke, PharmD, [facility] Clinical Pharmacist [facility] [phone]

## 2022-02-26 ENCOUNTER — Other Ambulatory Visit: Payer: Self-pay | Admitting: Neurology

## 2022-02-26 NOTE — Patient Instructions (Addendum)
Visit Information  Phone number for Pharmacist: [phone]   Goals Addressed             This Visit's Progress    Monitor and Manage My Blood Sugar-Diabetes Type 2       Timeframe:  Long-Range Goal Priority:  High Start Date:      09/26/21                       Expected End Date:    09/26/22                   Follow Up Date July 2023   - check blood sugar at prescribed times - check blood sugar before and after exercise - check blood sugar if I feel it is too high or too low - take the blood sugar meter to all doctor visits -Use Freestyle Libre to monitor sugar continuously    Why is this important?   Checking your blood sugar at home helps to keep it from getting very high or very low.  Writing the results in a diary or log helps the doctor know how to care for you.  Your blood sugar log should have the time, date and the results.  Also, write down the amount of insulin or other medicine that you take.  Other information, like what you ate, exercise done and how you were feeling, will also be helpful.     Notes:         Care Plan : [place]  Updates made by [name], RPH since 02/26/2022 12:00 AM     Problem: Hypertension, Hyperlipidemia, Diabetes, Atrial Fibrillation, Heart Failure, Coronary Artery Disease, and Chronic Kidney Disease   Priority: High     Long-Range Goal: Disease management   Start Date: 06/29/2021  Expected End Date: 06/29/2022  This Visit's Progress: On track  Recent Progress: On track  Priority: High  Note:   Current Barriers:  Unable to maintain control of DM, CKD   Pharmacist Clinical Goal(s):  Patient will adhere to plan to optimize therapeutic regimen for DM, HF as evidenced by report of adherence to recommended medication management changes through collaboration with PharmD and provider.    Interventions: 1:1 collaboration with [name], MD regarding development and update of comprehensive plan of  care as evidenced by provider attestation and co-signature Inter-disciplinary care team collaboration (see longitudinal plan of care) Comprehensive medication review performed; medication list updated in electronic medical record   Hyperlipidemia: (LDL goal < 70) -Controlled - LDL is a goal; pt reports compliance with medications as prescribed; she endorses some bleeding issues - when she nicks herself it takes longer to stop bleeding, she denies blood in urine/stool -Hx CAD - MI 07/2012 w/ stent; Hx stroke -Current treatment: Atorvastatin 10 mg daily - Appropriate, Effective, Safe, Accessible Isosorbide MN 30 mg daily - Appropriate, Effective, Safe, Accessible Nitroglycerin 0.4 mg SL prn - Appropriate, Effective, Safe, Accessible Clopidogrel 75 mg daily HS - Appropriate, Effective, Safe, Accessible Aspirin 81 mg daily - Appropriate, Effective, Safe, Accessible -Educated on Cholesterol goals; Benefits of statin for ASCVD risk reduction; -Counseled on benefits of clopidogrel/aspirin for stent patency; discussed nuisance bleeding vs major bleeding; counseled on signs/symptoms of major bleeding and when to seek emergency care -Recommended to continue current medication   Diabetes (A1c goal <7%) -Uncontrolled, improving- A1c 8.1% (09/2021) above goal; pt endorses compliance with insulin regimen as prescribed increased Ozempic to 0.5 mg ~  3 weeks ago as instructed -she reports she typically takes Novolog twice a day since she eats 2 meals per day - usually ~5 units in AM, 6-7 units with PM meal -Pt reports rare hypoglycemia; she uses peanut butter for hypoglycemic episodes -Hx toe amputation, osteomyelitis -Home BG (via [employer] - uses reader) - avg 134 (7 days) Fasting: 88-116 Low sugar - 68, either bedtime or overnight; she denies low sugars after Novolog doses -Current medications: Lantus 22 units daily HS -Appropriate, Effective, Query Safe Novolog 5-10 units TID per sliding scale -  usually BID -Appropriate, Effective, Safe, Accessible Ozempic 0.5 mg weekly -Appropriate, Effective, Safe, Accessible Freestyle Libre 2 -Medications previously tried: Musician, glimepiride, Jardiance, metformin, Januvia -Reviewed home BG -significant improvements seen with higher dose of Ozempic, even having some low sugars occasionally -Recommend to reduce Lantus to 20 units to prevent hypoglycemia   Atrial Tachyardia/NSVT/AFIB (Goal: prevent stroke and major bleeding) -Controlled - pt reports palpitations have improved since being on amiodarone -Clarified Afib dx previously - pt had 1 hr of SCAF on device, no repeat events; cardiology has not recommended anticoagulation -CHADSVASC: 8; per chart review patient has never been on anticoagulation for Afib (was briefly on Eliquis for a DVT in 2016); she is currently on DAPT for hx CAD w/ stent -Current treatment: Amiodarone 200 mg - 1/2 tab QOD Appropriate, Effective, Safe, Accessible Carvedilol 3.125 mg AM - Appropriate, Effective, Safe, Accessible -Medications previously tried: Eliquis (for DVT, 2016) -Counseled on benefits of rate/rhythm control drugs to prevent tachycardia -Recommended to continue current medication   Heart Failure (Goal: manage symptoms and prevent exacerbations) -Controlled - pt reports swelling is under control with 40 mg of torsemide/day; her kidney function deteriorated with 60 mg of torsemide but appears to be improving -Current home BP/HR readings: 139/80, HR high 50s-low 60s -Last ejection fraction: 50-55% (Date: 11/26/21) -HF type: Diastolic; NYHA Class: I-II -Current treatment: Carvedilol 3.125 mg BID -Appropriate, Effective, Safe, Accessible Isosorbide MN 30 mg daily -Appropriate, Effective, Safe, Accessible Losartan 25 mg - 1/2 tab daily -Appropriate, Effective, Safe, Accessible Torsemide 20 mg - 2 tab daily-Appropriate, Effective, Query Safe -Medications previously tried: Jardiance 10 mg -Educated on  Benefits of medications for managing symptoms and prolonging life; Importance of blood pressure control -Recommend to continue current medication   CKD stage 4 / HyperPTH (Goal: prevent progression) -Query controlled - Cr was significantly worse on high dose torsemide (3.04, baseline 1.14 Oct 2020) but has improved after holding torsemide/losartan for a few days (most recent Cr 2.11), pt is due for repeat BMP again -Pt reports she is overdue for nephrology appt - they have declined to refill calcitriol until she makes an appt; per pt she needs fire dept to help her down the steps so consolidating lab/doctor appts would be helpful -All medications assessed for renal dosing and appropriateness in chronic kidney disease. -Current treatment  Calcitriol 0.5 mg daily AM -Appropriate, Effective, Safe, Accessible Ferrous sulfate 325 mg BID -Appropriate, Effective, Safe, Accessible Losartan 25 mg - 1/2 tab daily -Appropriate, Effective, Safe, Accessible -Counseled on maintaining adequate hydration; importance of BP and DM control to prevent CKD progression; avoidance of NSAIDs and other nephrotoxins -Recommended to continue current medication; advised to schedule nephrology appt and ask to get labwork done same day   Health Maintenance -Vaccine gaps: Shingrix, covid booster, flu   Patient Goals/Self-Care Activities Patient will:  - take medications as prescribed -focus on medication adherence by pill box -check glucose daily, document, and provide at future  appointments -check blood pressure daily, document, and provide at future appointments -Schedule Nephrology appt and ask to get labwork same day      Patient verbalizes understanding of instructions and care plan provided today and agrees to view in [place]. Active MyChart status and patient understanding of how to access instructions and care plan via MyChart confirmed with patient.    Telephone follow up appointment with pharmacy team member  scheduled for: 3 months  Charlene Brooke, PharmD, [facility] Clinical Pharmacist [place] [facility] [phone]

## 2022-03-06 DIAGNOSIS — I10 Essential (primary) hypertension: Secondary | ICD-10-CM | POA: Diagnosis not present

## 2022-03-06 DIAGNOSIS — R809 Proteinuria, unspecified: Secondary | ICD-10-CM | POA: Diagnosis not present

## 2022-03-06 DIAGNOSIS — N2581 Secondary hyperparathyroidism of renal origin: Secondary | ICD-10-CM | POA: Diagnosis not present

## 2022-03-06 DIAGNOSIS — E1122 Type 2 diabetes mellitus with diabetic chronic kidney disease: Secondary | ICD-10-CM | POA: Diagnosis not present

## 2022-03-06 DIAGNOSIS — D631 Anemia in chronic kidney disease: Secondary | ICD-10-CM | POA: Diagnosis not present

## 2022-03-06 DIAGNOSIS — N184 Chronic kidney disease, stage 4 (severe): Secondary | ICD-10-CM | POA: Diagnosis not present

## 2022-03-07 ENCOUNTER — Encounter: Payer: Self-pay | Admitting: Internal Medicine

## 2022-03-08 ENCOUNTER — Telehealth: Payer: Self-pay

## 2022-03-08 MED ORDER — FREESTYLE LIBRE 2 READER DEVI: 0 refills | Status: AC

## 2022-03-08 NOTE — Telephone Encounter (Signed)
MEDICATION: Continuous Blood Gluc Receiver (FREESTYLE LIBRE 2 READER) DEVI   PHARMACY: [facility]  Comments: Received a call from [name]. Patient broke her meter, and is needing a new one, as she doesn't get another one for another two weeks. Does still have test strips but is needing the device.   **Let patient know to contact pharmacy at the end of the day to make sure medication is ready. **  ** Please notify patient to allow 48-72 hours to process**  **Encourage patient to contact the pharmacy for refills or they can request refills through [facility]**

## 2022-03-08 NOTE — Telephone Encounter (Signed)
Erx sent

## 2022-03-12 ENCOUNTER — Other Ambulatory Visit: Payer: Self-pay | Admitting: Family Medicine

## 2022-03-13 DIAGNOSIS — I480 Paroxysmal atrial fibrillation: Secondary | ICD-10-CM | POA: Diagnosis not present

## 2022-03-13 DIAGNOSIS — D6869 Other thrombophilia: Secondary | ICD-10-CM | POA: Diagnosis not present

## 2022-03-13 DIAGNOSIS — M25461 Effusion, right knee: Secondary | ICD-10-CM | POA: Diagnosis not present

## 2022-03-13 DIAGNOSIS — E1122 Type 2 diabetes mellitus with diabetic chronic kidney disease: Secondary | ICD-10-CM | POA: Diagnosis not present

## 2022-03-13 DIAGNOSIS — K5900 Constipation, unspecified: Secondary | ICD-10-CM | POA: Diagnosis not present

## 2022-03-13 DIAGNOSIS — D692 Other nonthrombocytopenic purpura: Secondary | ICD-10-CM | POA: Diagnosis not present

## 2022-03-13 DIAGNOSIS — N184 Chronic kidney disease, stage 4 (severe): Secondary | ICD-10-CM | POA: Diagnosis not present

## 2022-03-13 DIAGNOSIS — M25561 Pain in right knee: Secondary | ICD-10-CM | POA: Diagnosis not present

## 2022-03-13 DIAGNOSIS — E1151 Type 2 diabetes mellitus with diabetic peripheral angiopathy without gangrene: Secondary | ICD-10-CM | POA: Diagnosis not present

## 2022-03-13 DIAGNOSIS — I25118 Atherosclerotic heart disease of native coronary artery with other forms of angina pectoris: Secondary | ICD-10-CM | POA: Diagnosis not present

## 2022-03-13 DIAGNOSIS — I509 Heart failure, unspecified: Secondary | ICD-10-CM | POA: Diagnosis not present

## 2022-03-13 DIAGNOSIS — G8929 Other chronic pain: Secondary | ICD-10-CM | POA: Diagnosis not present

## 2022-03-15 ENCOUNTER — Encounter (HOSPITAL_COMMUNITY): Payer: HMO | Admitting: Internal Medicine

## 2022-03-20 ENCOUNTER — Telehealth: Payer: Self-pay

## 2022-03-20 NOTE — Telephone Encounter (Signed)
Pre-operative Risk Assessment    Patient Name: [name]  DOB: [birth year] MRN: [id number]      Request for Surgical Clearance    Procedure:   Right total knee arthroplasty  Date of Surgery:  Clearance 05/07/22                                 Surgeon:  Dr. Durene Romans Surgeon's Group or Practice Name:  [facility] Phone number:  [phone] Fax number:  [phone]   Type of Clearance Requested:   - Medical  - Pharmacy:  Hold Aspirin and Clopidogrel (Plavix)     Type of Anesthesia:  Spinal   Additional requests/questions:    Signed, Madison Direnzo   03/20/2022, 4:46 PM

## 2022-03-21 NOTE — Telephone Encounter (Signed)
She has an appointment with Dr. Haroldine Laws on 04/05/22. She will need to keep this appointment to address medical/antiplatelet clearance for upcoming surgery.

## 2022-03-21 NOTE — Telephone Encounter (Signed)
Will fax FYI to requesting office the pt has appt 04/05/22 with Dr. Haroldine Laws

## 2022-03-22 ENCOUNTER — Ambulatory Visit: Payer: HMO

## 2022-04-01 ENCOUNTER — Telehealth: Payer: Self-pay | Admitting: Family Medicine

## 2022-04-05 ENCOUNTER — Encounter (HOSPITAL_COMMUNITY): Payer: Self-pay | Admitting: Internal Medicine

## 2022-04-05 ENCOUNTER — Ambulatory Visit (HOSPITAL_COMMUNITY)
Admission: RE | Admit: 2022-04-05 | Discharge: 2022-04-05 | Disposition: A | Discharge status: Home / Self Care | Payer: HMO | Source: Ambulatory Visit | Attending: Internal Medicine | Admitting: Internal Medicine

## 2022-04-05 VITALS — BP 130/64 | HR 56 | Wt 252.4 lb

## 2022-04-05 DIAGNOSIS — Z9049 Acquired absence of other specified parts of digestive tract: Secondary | ICD-10-CM | POA: Insufficient documentation

## 2022-04-05 DIAGNOSIS — E1142 Type 2 diabetes mellitus with diabetic polyneuropathy: Secondary | ICD-10-CM | POA: Insufficient documentation

## 2022-04-05 DIAGNOSIS — M25569 Pain in unspecified knee: Secondary | ICD-10-CM | POA: Diagnosis not present

## 2022-04-05 DIAGNOSIS — I428 Other cardiomyopathies: Secondary | ICD-10-CM | POA: Insufficient documentation

## 2022-04-05 DIAGNOSIS — G4733 Obstructive sleep apnea (adult) (pediatric): Secondary | ICD-10-CM | POA: Insufficient documentation

## 2022-04-05 DIAGNOSIS — Z79890 Hormone replacement therapy (postmenopausal): Secondary | ICD-10-CM | POA: Diagnosis not present

## 2022-04-05 DIAGNOSIS — I5022 Chronic systolic (congestive) heart failure: Secondary | ICD-10-CM | POA: Insufficient documentation

## 2022-04-05 DIAGNOSIS — E1122 Type 2 diabetes mellitus with diabetic chronic kidney disease: Secondary | ICD-10-CM | POA: Diagnosis not present

## 2022-04-05 DIAGNOSIS — I2510 Atherosclerotic heart disease of native coronary artery without angina pectoris: Secondary | ICD-10-CM | POA: Insufficient documentation

## 2022-04-05 DIAGNOSIS — Z95810 Presence of automatic (implantable) cardiac defibrillator: Secondary | ICD-10-CM | POA: Diagnosis not present

## 2022-04-05 DIAGNOSIS — Z79899 Other long term (current) drug therapy: Secondary | ICD-10-CM | POA: Insufficient documentation

## 2022-04-05 DIAGNOSIS — I472 Ventricular tachycardia, unspecified: Secondary | ICD-10-CM | POA: Diagnosis not present

## 2022-04-05 DIAGNOSIS — Z7902 Long term (current) use of antithrombotics/antiplatelets: Secondary | ICD-10-CM | POA: Diagnosis not present

## 2022-04-05 DIAGNOSIS — I130 Hypertensive heart and chronic kidney disease with heart failure and stage 1 through stage 4 chronic kidney disease, or unspecified chronic kidney disease: Secondary | ICD-10-CM | POA: Diagnosis not present

## 2022-04-05 DIAGNOSIS — I252 Old myocardial infarction: Secondary | ICD-10-CM | POA: Diagnosis not present

## 2022-04-05 DIAGNOSIS — N184 Chronic kidney disease, stage 4 (severe): Secondary | ICD-10-CM | POA: Insufficient documentation

## 2022-04-05 DIAGNOSIS — E039 Hypothyroidism, unspecified: Secondary | ICD-10-CM | POA: Insufficient documentation

## 2022-04-05 DIAGNOSIS — Z01810 Encounter for preprocedural cardiovascular examination: Secondary | ICD-10-CM | POA: Insufficient documentation

## 2022-04-05 DIAGNOSIS — E1151 Type 2 diabetes mellitus with diabetic peripheral angiopathy without gangrene: Secondary | ICD-10-CM | POA: Diagnosis not present

## 2022-04-05 DIAGNOSIS — I447 Left bundle-branch block, unspecified: Secondary | ICD-10-CM | POA: Diagnosis not present

## 2022-04-05 DIAGNOSIS — E1136 Type 2 diabetes mellitus with diabetic cataract: Secondary | ICD-10-CM | POA: Diagnosis not present

## 2022-04-05 NOTE — Progress Notes (Signed)
Advanced Heart Failure Clinic Note     Primary Physician [name], MD Primary Cardiologist: Ida Rogue, MD  Electrophysiologist: Virl Axe, MD   Advanced HF: Dr. Haroldine Laws  Sleep Clinic: Dr. Radford Pax   Reason for Visit/CC: Pre-op clearance   HPI:  [name] is a 66 y.o. 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   Admitted on 6/20 with A/C biventricular HF. EF improved previously with CRT but back down again 10-15% (6/20) after CRT removal at [facility] due to sepsis with TV endocarditis. Placed on milrinone which was later weaned off. Unfortunately while hospitalized her husband had CVA and passed away on Apr 12, 2019. On April 12, 2019 she underwent CRT-D reimplantation and was discharged home.   She has made a steady recovery with CRT. Echo 6/21 EF 50-55%   Admitted 4/22 for osteomyelitis of left foot after failing outpatient management. Had initial surgery (I&D, bone biopsy, and sesamoidectomy). Vascular was consulted and she underwent CO2 angiogram with angioplasty of 3 separate in-stent stenoses in the left superficial femoral artery, and then revision of surgery (bone resection of 5th metatarsal head and base of the prox phalanx of the 5th).   Admitted 5/22 for cellulitis. She underwent amputation of left great toe. Repeat arterial studies showed a 50-59% stenosis on the distal left popliteal stent segment, no new interventions per vascular. ID was consulted and recommended 6 weeks of abx. Tunneled central line placed (PICC avoided due to CKD) and she was discharged on Daptomycin.   Echo 2/23 EF 50-55%   Today she returns for pre-op CV clearance. Pending R TKA (under local block)  with Dr. Alvan Dame on 05/07/22. Has been sedentary due to previous L foot osteo and severe R knee pain. Gets around the house with walker  without problem. + L ankle swelling but otherwise ok> No palpitations or syncope.   Cardiac Studies  - Echo (6/20): EF 10-15%, RV severely reduced, moderately enlarged, RVSP 45 mmHG, moderate RAE, moderate MR, mod/severe TR.  - RHC (6/20):    Most Recent Value  Fick Cardiac Output 5.37 L/min  Fick Cardiac Output Index 2.24 (L/min)/BSA  RA A Wave 16 mmHg  RA V Wave 18 mmHg  RA Mean 16 mmHg  RV Systolic Pressure 57 mmHg  RV Diastolic Pressure 8 mmHg  RV EDP 20 mmHg  PA Systolic Pressure 57 mmHg  PA Diastolic Pressure 28 mmHg  PA Mean 40 mmHg  PW A Wave 27 mmHg  PW V Wave 36 mmHg  PW Mean 27 mmHg  QP/QS 1  TPVR Index 17.85 HRUI   Current Meds  Medication Sig   acetaminophen (TYLENOL) 500 MG tablet Take 1,500 mg by mouth every 6 (six) hours as needed for moderate pain or headache.   amiodarone (PACERONE) 200 MG tablet TAKE 1/2 TABLET BY MOUTH EVERY OTHER DAY   aspirin EC 81 MG EC tablet Take 1 tablet (81 mg total) by mouth daily. Swallow whole.   atorvastatin (LIPITOR) 10 MG tablet TAKE 1 TABLET BY MOUTH ONCE DAILY   calcitRIOL (ROCALTROL) 0.5 MCG capsule Take 0.5 mcg by mouth every morning.   Calcium Carb-Cholecalciferol (CALCIUM CARBONATE-VITAMIN D3) 600-400 MG-UNIT TABS Take 1 tablet by mouth 2 (two) times a day.   carvedilol (COREG) 3.125 MG tablet TAKE 1 TABLET BY MOUTH TWICE (2) DAILY WITH A MEAL  clopidogrel (PLAVIX) 75 MG tablet Take 1 tablet (75 mg total) by mouth daily.   Continuous Blood Gluc Receiver (FREESTYLE LIBRE 2 READER) DEVI Use to check sugar multiple times per day as directed.  H/o DM2, treated with insulin.   Continuous Blood Gluc Receiver (FREESTYLE LIBRE 2 READER) DEVI FreeStyle Libre 2 Reader   docusate sodium (COLACE) 100 MG capsule Take 100 mg by mouth daily.   DULoxetine (CYMBALTA) 30 MG capsule TAKE 1 CAPSULE BY MOUTH EVERY NIGHT AT BEDTIME   gabapentin (NEURONTIN) 300 MG capsule Take 1 capsule (300 mg total) by mouth daily.   gabapentin (NEURONTIN)  600 MG tablet Take 1,200 mg by mouth at bedtime.   Insulin Aspart FlexPen (NOVOLOG) 100 UNIT/ML Inject 0-10 Units into the skin 3 (three) times daily before meals. Per sliding scale   insulin glargine (LANTUS) 100 UNIT/ML injection Inject 18 Units into the skin daily.   Insulin Pen Needle (PEN NEEDLES) 32G X 6 MM MISC 1 each by Does not apply route 4 (four) times daily.   isosorbide mononitrate (IMDUR) 30 MG 24 hr tablet TAKE 1 TABLET BY MOUTH ONCE DAILY   levothyroxine (SYNTHROID) 50 MCG tablet TAKE 1 TABLET BY MOUTH ONCE DAILY BEFOREBREAKFAST   losartan (COZAAR) 25 MG tablet TAKE 1/2 TABLET BY MOUTH DAILY   Multiple Vitamin (MULTIVITAMIN WITH MINERALS) TABS tablet Take 1 tablet by mouth every morning.   nitroGLYCERIN (NITROSTAT) 0.4 MG SL tablet Place 1 tablet (0.4 mg total) under the tongue every 5 (five) minutes as needed for chest pain.   Omega-3 Fatty Acids (FISH OIL) 1000 MG CAPS Take 1,000 mg by mouth 2 (two) times a day.   torsemide (DEMADEX) 20 MG tablet Take 40 mg by mouth daily.   traMADol (ULTRAM) 50 MG tablet TAKE 1 TO 2 TABLET BY MOUTH AT BEDTIME AS NEEDED   Allergies  Allergen Reactions   Heparin     Can not remember reaction   Nsaids     Kidney disease   Past Medical History:  Diagnosis Date   Acute myocardial infarction, subendocardial infarction, initial episode of care ([place]) 07/21/2012   Acute osteomyelitis involving ankle and foot ([place]) 21/30/8657   Acute systolic heart failure ([place]) 07/21/2012   New onset 07/19/12; admission to [facility] ED. Elevated Troponins.  S/p 2D-echo with EF 20-25%.  S/p cardiac catheterization with stenting LAD.  Repeat 2D-echo 10/2011 with improved EF of 35%.    Anemia    Automatic implantable cardioverter-defibrillator in situ    a. MDT CRT-D 06/2014, SN: [id number]  .removed in feb 2020   CAD (coronary artery disease)    a. cardiac cath 101/04/2012: PCI/DES to chronically occluded mLAD, consideration PCI to diag branch in 4 weeks.     Cataract    Chicken pox    Chronic kidney disease    Chronic systolic CHF (congestive heart failure) (HCC)    a. mixed ICM & NICM; b. EF 20-25% by echo 07/2012, mid-dist 2/3 of LV sev HK/AK, mild MR. echo 10/2012: EF 30-35%, sev HK ant-septal & inf walls, GR1DD, mild MR, PASP 33. c. echo 02/2013: EF 30%, GR1DD, mild MR. echo 04/2014: EF 30%, Septal-lat dyssynchrony, global HK, inf AK, GR1DD, mild MR. d. echo 10/2014: EF50-55%, WM nl, GR1DD, septal mild paradox. e. echo 02/2015: EF 50-55%, wm    Depression    Heart attack ([place])    Heel ulcer ([place]) 04/27/2015   History of blood transfusion ~ 2011   "plasma; had neuropathy; couldn't walk"  Hypertension    Hypothyroidism    LBBB (left bundle branch block)    Neuromuscular disorder ([place])    Neuropathy 2011   Obesity, unspecified    OSA on CPAP    Moderate with AHI 23/hr and now on CPAP at 16cm H2O.  Her DME is [facility]   Pure hypercholesterolemia    Sepsis ([place]) 09/2018   Type II diabetes mellitus ([place])    Unspecified vitamin D deficiency    Family History  Problem Relation Age of Onset   Diabetes Mother    Hypertension Mother    Arthritis Mother        knees, lumbar DDD, cervical DDD   Cancer Father        prostate,skin,lymphoma.   Cancer Brother 75       bladder cancer; non-smoker   Diabetes Maternal Grandmother    Heart disease Maternal Grandmother    COPD Maternal Grandmother    Diabetes Paternal Grandmother    Obesity Brother    Diabetes Son    Hypertension Son    Cancer Maternal Grandfather    Diabetes Paternal Grandfather    Past Surgical History:  Procedure Laterality Date   ABDOMINAL AORTOGRAM W/LOWER EXTREMITY N/A 02/02/2021   Procedure: ABDOMINAL AORTOGRAM W/LOWER EXTREMITY;  Surgeon: Angelia Mould, MD;  Location: [place] [facility];  Service: Cardiovascular;  Laterality: N/A;   AMPUTATION Left 03/02/2021   Procedure: AMPUTATION RAY, left great toe;  Surgeon: Trula Slade, DPM;  Location: [place];  Service:  Podiatry;  Laterality: Left;   AMPUTATION TOE Right 06/18/2015   Procedure: AMPUTATION TOE;  Surgeon: Samara Deist, DPM;  Location: [facility];  Service: Podiatry;  Laterality: Right;   AMPUTATION TOE Left 10/08/2018   Procedure: AMPUTATION TOE LEFT 2ND;  Surgeon: Albertine Patricia, DPM;  Location: [facility];  Service: Podiatry;  Laterality: Left;   BACK SURGERY     BI-VENTRICULAR IMPLANTABLE CARDIOVERTER DEFIBRILLATOR N/A 07/06/2014   Procedure: BI-VENTRICULAR IMPLANTABLE CARDIOVERTER DEFIBRILLATOR  (CRT-D);  Surgeon: Deboraha Sprang, MD;  Location: [facility] CATH LAB;  Service: Cardiovascular;  Laterality: N/A;   BI-VENTRICULAR IMPLANTABLE CARDIOVERTER DEFIBRILLATOR  (CRT-D)  07/06/2014   BILATERAL OOPHORECTOMY  01/2011   ovarian cyst benign   BIV ICD INSERTION CRT-D N/A 03/31/2019   Procedure: BIV ICD INSERTION CRT-D;  Surgeon: Constance Haw, MD;  Location: [place] [facility];  Service: Cardiovascular;  Laterality: N/A;   COLONOSCOPY WITH PROPOFOL Left 02/22/2015   Procedure: COLONOSCOPY WITH PROPOFOL;  Surgeon: Hulen Luster, MD;  Location: [facility] ENDOSCOPY;  Service: Endoscopy;  Laterality: Left;   CORONARY ANGIOPLASTY WITH STENT PLACEMENT Left 07/2012   new onset systolic CHF; elevated troponins.  Cardiac catheterization with stenting to LAD; EF 15%.  2D-echo: EF 20-25%.   ESOPHAGOGASTRODUODENOSCOPY N/A 02/22/2015   Procedure: ESOPHAGOGASTRODUODENOSCOPY (EGD);  Surgeon: Hulen Luster, MD;  Location: [facility] ENDOSCOPY;  Service: Endoscopy;  Laterality: N/A;   I & D EXTREMITY Left 11/28/2020   Procedure: IRRIGATION AND DEBRIDEMENT OF FOOT;  Surgeon: Trula Slade, DPM;  Location: [place];  Service: Podiatry;  Laterality: Left;   I & D EXTREMITY Left 01/31/2021   Procedure: IRRIGATION AND DEBRIDEMENT EXTREMITY OF LEFT FOOT WITH BONE BIOPSY OF  1ST METATARSAL AND FIFTH METATARSAL;  Surgeon: Felipa Furnace, DPM;  Location: [place];  Service: Podiatry;  Laterality: Left;   I & D EXTREMITY Left 02/03/2021   Procedure:  IRRIGATION AND DEBRIDEMENT EXTREMITY WITH INTERNAL AMPUTATION FIRST AND FIFTH RAY;  Surgeon: Boneta Lucks  P, DPM;  Location: [place];  Service: Podiatry;  Laterality: Left;   INCISION AND DRAINAGE ABSCESS Right 2007   groin; with ICU stay due to sepsis.   IR FLUORO GUIDE CV LINE RIGHT  03/06/2021   IR REMOVAL TUN CV CATH W/O FL  04/27/2021   IR US GUIDE VASC ACCESS RIGHT  03/06/2021   IRRIGATION AND DEBRIDEMENT FOOT Left 04/25/2021   Procedure: EXCISION AND DEBRIDEMENT WOUND ULCER LEFT FOOT; METATARSECTOMY;  Surgeon: Trula Slade, DPM;  Location: [facility];  Service: Podiatry;  Laterality: Left;   LAPAROSCOPIC CHOLECYSTECTOMY  2011   LEFT HEART CATH AND CORONARY ANGIOGRAPHY N/A 10/05/2018   Procedure: LEFT HEART CATH AND CORONARY ANGIOGRAPHY;  Surgeon: Minna Merritts, MD;  Location: [place] [facility];  Service: Cardiovascular;  Laterality: N/A;   LEFT HEART CATHETERIZATION WITH CORONARY ANGIOGRAM N/A 07/21/2012   Procedure: LEFT HEART CATHETERIZATION WITH CORONARY ANGIOGRAM;  Surgeon: Jolaine Artist, MD;  Location: [facility] CATH LAB;  Service: Cardiovascular;  Laterality: N/A;   LUMBAR [place]   L4-5   PACEMAKER REMOVAL  11/2018   due to infection around pacemaker   [place] (PCI-S) N/A 07/23/2012   Procedure: PERCUTANEOUS CORONARY STENT INTERVENTION (PCI-S);  Surgeon: Sherren Mocha, MD;  Location: [facility] CATH LAB;  Service: Cardiovascular;  Laterality: N/A;   PERIPHERAL VASCULAR BALLOON ANGIOPLASTY Left 10/06/2018   Procedure: PERIPHERAL VASCULAR BALLOON ANGIOPLASTY;  Surgeon: Katha Cabal, MD;  Location: [place] [facility];  Service: Cardiovascular;  Laterality: Left;   PERIPHERAL VASCULAR CATHETERIZATION N/A 02/10/2015   Procedure: Picc Line Insertion;  Surgeon: Katha Cabal, MD;  Location: [place] [facility];  Service: Cardiovascular;  Laterality: N/A;   RIGHT HEART CATH N/A 03/29/2019   Procedure: RIGHT HEART CATH;  Surgeon: Jolaine Artist, MD;  Location: [place] [facility];  Service: Cardiovascular;  Laterality: N/A;   TEE WITHOUT CARDIOVERSION N/A 10/02/2018   Procedure: TRANSESOPHAGEAL ECHOCARDIOGRAM (TEE);  Surgeon: Wellington Hampshire, MD;  Location: [facility];  Service: Cardiovascular;  Laterality: N/A;   [place]  01/2011   Fibroids/DUB.  Ovaries removed. [place].   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  Tobacco Use   Smoking status: Never   Smokeless tobacco: Never  Vaping Use   Vaping Use: Never used  Substance and Sexual Activity   Alcohol use: No    Alcohol/week: 0.0 standard drinks of alcohol   Drug use: No   Sexual activity: Not Currently    Birth control/protection: Post-menopausal, Surgical  Other Topics Concern   Not on file  Social History Narrative   Widowed, husband died when she was in surgery.        Children:  2 children (daughter, son). Two grandsons and 2 step grandchildren.      Lives: with husband, daughter, son-in-law, 2 grandsons, and 1 on the way.      Employment: disability for peripheral neuropathy 2012.  Previously had home daycare.   Social Determinants of Health   Financial Resource Strain: Low Risk  (03/21/2021)   Overall Emergency planning/management officer Strain (  CARDIA)    Difficulty of Paying Living Expenses: Not hard at all  Food Insecurity: No Food Insecurity (03/21/2021)   Hunger Vital Sign    Worried About Running Out of Food in the Last Year: Never true    Ran Out of Food in the Last Year: Never true  Transportation Needs: No Transportation Needs (03/21/2021)   PRAPARE - Hydrologist (Medical): No     Lack of Transportation (Non-Medical): No  Physical Activity: Inactive (03/21/2021)   Exercise Vital Sign    Days of Exercise per Week: 0 days    Minutes of Exercise per Session: 0 min  Stress: No Stress Concern Present (03/21/2021)   [place]    Feeling of Stress : Not at all  Social Connections: Somewhat Isolated (09/08/2017)   Social Connection and Isolation Panel [NHANES]    Frequency of Communication with Friends and Family: More than three times a week    Frequency of Social Gatherings with Friends and Family: More than three times a week    Attends Religious Services: Never    Marine scientist or Organizations: No    Attends Archivist Meetings: Never    Marital Status: Married  Human resources officer Violence: Not At Risk (03/21/2021)   Humiliation, Afraid, Rape, and Kick questionnaire    Fear of Current or Ex-Partner: No    Emotionally Abused: No    Physically Abused: No    Sexually Abused: No    Lipid Panel     Component Value Date/Time   CHOL 96 10/03/2021 1137   CHOL 128 09/26/2020 1216   TRIG 70.0 10/03/2021 1137   HDL 38.90 (L) 10/03/2021 1137   HDL 44 09/26/2020 1216   CHOLHDL 2 10/03/2021 1137   VLDL 14.0 10/03/2021 1137   LDLCALC 43 10/03/2021 1137   [place] 60 09/26/2020 1216   Wt Readings from Last 3 Encounters:  04/05/22 114.5 kg (252 lb 6.4 oz)  01/03/22 127 kg (280 lb)  09/24/21 127 kg (280 lb)   BP 130/64   Pulse (!) 56   Wt 114.5 kg (252 lb 6.4 oz)   SpO2 98%   BMI 38.38 kg/m   Physical Exam General:  Obese woman No resp difficulty, arrived in [facility] HEENT: normal Neck: supple. no JVD. Carotids 2+ bilat; no bruits. No lymphadenopathy or thryomegaly appreciated. Cor: PMI nondisplaced. Regular rate & rhythm. No rubs, gallops or murmurs. Lungs: clear Abdomen: obese soft, nontender, nondistended. No hepatosplenomegaly. No bruits or masses. Good bowel sounds. Extremities: no  cyanosis, clubbing, rash, tr edema Neuro: alert & orientedx3, cranial nerves grossly intact. moves all 4 extremities w/o difficulty. Affect pleasant  ECG: sinus brady 57 with biv pacing Personally reviewed  Optivol: OptiVol up in May but now back down,  no AF, 100% BiV-pacing, no VT/VF, daily activity < 1hr/day Personally reviewed  ASSESSMENT AND PLAN:   1. Chronic Systolic HF/ Mixed Ischemic/NICM w/ LBBB:  - Echo 6/20 EF 10-15%.(after extraction of CRT) - Echo 12/20 EF 35% s/p MDT CRT-D re-implant (initally removed due to sepsis).  - Echo 12/21 EF 50-55% (normalized with CRT-D) - Echo 2/23 EF 50-55%  - Stable NYHA III, limited mostly by knee pain. Volume was up earlier this month but now better - Continue torsemide 20 mg daily. Can take extra 20 PRN swelling. - Continue carvedilol 3.125 mg bid. - Continue losartan 12.5 mg daily. - Continue Imdur 30 mg daily. -  Off Jardiance with CKD (Nephrologist stopped) - No Entresto or spiro with CKD IV.  2. CKD, Stage IV:  - Followed by nephrology (Dr. Rolly Salter [facility]). - Baseline creatinine 2.0-2.5. -Recent labs ok   3. PAT & NSVT, Frequent PVCs:  - Suppressed on amiodarone 200 mg daily. - LFTS (12/22) and TSH (6/22) ok. - Denies palpitations. Followed by Dr. Caryl Comes.  4. Hypothyroidism:  - On synthroid.  5. CAD:   - s/p prior MI>>LAD DES.  - No s/s angina - On Plavix and statin.   6. OSA:  - Compliant w/ CPAP using nasal device and chin strap.  7. DM2: - Managed by PCP.  8. PVD: - s/p angioplasty and stenting of LLE 2019 - s/p CO2 ateriogram w/ angioplasty of 3 separate in-stent stenoses in left femoral artery 2022. - No new LE wounds.  - Follows with Dr. Scot Dock   9. Pre-op cardiac risk stratification: - Overall low to moderate risk for peri-op CV complication with LE block. Ok to proceed without further testing   Glori Bickers, MD  04/05/22

## 2022-04-05 NOTE — Patient Instructions (Signed)
Medication Changes:  None, continue current medications  Lab Work:  None  Testing/Procedures:  None  Referrals:  None  Special Instructions // Education:  Do the following things EVERYDAY: Weigh yourself in the morning before breakfast. Write it down and keep it in a log. Take your medicines as prescribed Eat low salt foods--Limit salt (sodium) to 2000 mg per day.  Stay as active as you can everyday Limit all fluids for the day to less than 2 liters   Follow-Up in: 9 months (March 2024), **PLEASE CALL OUR OFFICE IN JANUARY 2024 TO SCHEDULE THIS APPOINTMENT  At the Advanced [facility] Failure Clinic, you and your health needs are our priority. We have a designated team specialized in the treatment of Heart Failure. This Care Team includes your primary Heart Failure Specialized Cardiologist (physician), Advanced Practice Providers (APPs- Physician Assistants and Nurse Practitioners), and Pharmacist who all work together to provide you with the care you need, when you need it.   You may see any of the following providers on your designated Care Team at your next follow up:  Dr Glori Bickers Dr Haynes Kerns, NP Lyda Jester, [place] [facility] [place], [place] Audry Riles, PharmD   Please be sure to bring in all your medications bottles to every appointment.   Need to Contact us:  If you have any questions or concerns before your next appointment please send [place] a message through [place] or call our office at [phone].    TO LEAVE A MESSAGE FOR THE NURSE SELECT OPTION 2, PLEASE LEAVE A MESSAGE INCLUDING: YOUR NAME DATE OF BIRTH CALL BACK NUMBER REASON FOR CALL**this is important as we prioritize the call backs  YOU WILL RECEIVE A CALL BACK THE SAME DAY AS LONG AS YOU CALL BEFORE 4:00 PM

## 2022-04-08 ENCOUNTER — Other Ambulatory Visit (HOSPITAL_COMMUNITY): Payer: Self-pay | Admitting: Family Medicine

## 2022-04-10 ENCOUNTER — Encounter: Payer: Self-pay | Admitting: Internal Medicine

## 2022-04-10 ENCOUNTER — Encounter: Payer: Self-pay | Admitting: Oncology

## 2022-04-10 ENCOUNTER — Other Ambulatory Visit: Payer: Self-pay

## 2022-04-10 ENCOUNTER — Inpatient Hospital Stay: Payer: HMO | Attending: Oncology | Admitting: Oncology

## 2022-04-10 ENCOUNTER — Inpatient Hospital Stay: Payer: HMO

## 2022-04-10 VITALS — BP 140/58 | HR 58 | Temp 96.6°F | Ht 68.0 in | Wt 252.0 lb

## 2022-04-10 DIAGNOSIS — D631 Anemia in chronic kidney disease: Secondary | ICD-10-CM | POA: Diagnosis not present

## 2022-04-10 DIAGNOSIS — N184 Chronic kidney disease, stage 4 (severe): Secondary | ICD-10-CM

## 2022-04-10 DIAGNOSIS — D649 Anemia, unspecified: Secondary | ICD-10-CM

## 2022-04-10 LAB — COMPREHENSIVE METABOLIC PANEL
ALT: 15 U/L (ref 0–44)
AST: 19 U/L (ref 15–41)
Albumin: 3.7 g/dL (ref 3.5–5.0)
Alkaline Phosphatase: 76 U/L (ref 38–126)
Anion gap: 10 (ref 5–15)
BUN: 49 mg/dL — ABNORMAL HIGH (ref 8–23)
CO2: 29 mmol/L (ref 22–32)
Calcium: 8.8 mg/dL — ABNORMAL LOW (ref 8.9–10.3)
Chloride: 99 mmol/L (ref 98–111)
Creatinine, Ser: 2.17 mg/dL — ABNORMAL HIGH (ref 0.44–1.00)
GFR, Estimated: 24 mL/min — ABNORMAL LOW (ref >60)
Glucose, Bld: 218 mg/dL — ABNORMAL HIGH (ref 70–99)
Potassium: 4.4 mmol/L (ref 3.5–5.1)
Sodium: 138 mmol/L (ref 135–145)
Total Bilirubin: 0.4 mg/dL (ref 0.3–1.2)
Total Protein: 7.3 g/dL (ref 6.5–8.1)

## 2022-04-10 LAB — IRON AND TIBC
Iron: 55 ug/dL (ref 28–170)
Saturation Ratios: 21 % (ref 10.4–31.8)
TIBC: 259 ug/dL (ref 250–450)
UIBC: 204 ug/dL

## 2022-04-10 LAB — TECHNOLOGIST SMEAR REVIEW
Plt Morphology: NORMAL
RBC MORPHOLOGY: NORMAL
WBC MORPHOLOGY: NORMAL

## 2022-04-10 LAB — RETIC PANEL
Immature Retic Fract: 9.9 % (ref 2.3–15.9)
RBC.: 3.74 MIL/uL — ABNORMAL LOW (ref 3.87–5.11)
Retic Count, Absolute: 70.7 10*3/uL (ref 19.0–186.0)
Retic Ct Pct: 1.9 % (ref 0.4–3.1)
Reticulocyte Hemoglobin: 31.2 pg (ref >27.9)

## 2022-04-10 LAB — CBC WITH DIFFERENTIAL/PLATELET
Abs Immature Granulocytes: 0.06 10*3/uL (ref 0.00–0.07)
Basophils Absolute: 0 10*3/uL (ref 0.0–0.1)
Basophils Relative: 0 %
Eosinophils Absolute: 0.1 10*3/uL (ref 0.0–0.5)
Eosinophils Relative: 1 %
HCT: 35 % — ABNORMAL LOW (ref 36.0–46.0)
Hemoglobin: 11.1 g/dL — ABNORMAL LOW (ref 12.0–15.0)
Immature Granulocytes: 1 %
Lymphocytes Relative: 16 %
Lymphs Abs: 1.2 10*3/uL (ref 0.7–4.0)
MCH: 29.5 pg (ref 26.0–34.0)
MCHC: 31.7 g/dL (ref 30.0–36.0)
MCV: 93.1 fL (ref 80.0–100.0)
Monocytes Absolute: 0.4 10*3/uL (ref 0.1–1.0)
Monocytes Relative: 6 %
Neutro Abs: 5.7 10*3/uL (ref 1.7–7.7)
Neutrophils Relative %: 76 %
Platelets: 210 10*3/uL (ref 150–400)
RBC: 3.76 MIL/uL — ABNORMAL LOW (ref 3.87–5.11)
RDW: 14.3 % (ref 11.5–15.5)
WBC: 7.5 10*3/uL (ref 4.0–10.5)
nRBC: 0 % (ref 0.0–0.2)

## 2022-04-10 LAB — VITAMIN B12: Vitamin B-12: 490 pg/mL (ref 180–914)

## 2022-04-10 LAB — LACTATE DEHYDROGENASE: LDH: 136 U/L (ref 98–192)

## 2022-04-10 LAB — FERRITIN: Ferritin: 305 ng/mL (ref 11–307)

## 2022-04-10 LAB — FOLATE: Folate: >40 ng/mL (ref >5.9)

## 2022-04-10 NOTE — Progress Notes (Signed)
Hematology/Oncology Consult note [phone] [phone]      Patient Care Team: [name], MD as PCP - General (Family Medicine) Minna Merritts, MD as PCP - Cardiology (Cardiology) Deboraha Sprang, MD as PCP - Electrophysiology (Cardiology) Bensimhon, Shaune Pascal, MD as PCP - Advanced Heart Failure (Cardiology) Calvert Cantor, MD as Consulting Physician (Ophthalmology) Lavonia Dana, MD as Consulting Physician (Internal Medicine) Elvina Mattes, Adele Schilder (Inactive) as Attending Physician (Podiatry) Tsosie Billing, MD as Consulting Physician (Infectious Diseases) Alda Berthold, DO as Consulting Physician (Neurology) [name], [facility] as Pharmacist (Pharmacist) Earlie Server, MD as Consulting Physician (Oncology)   REFERRING PROVIDER: Anthonette Legato, MD  CHIEF COMPLAINTS/REASON FOR VISIT:  Anemia  ASSESSMENT & PLAN:   Anemia in chronic kidney disease Labs reviewed and discussed with patient.  Anemia is likely secondary to chronic kidney disease.  Rule out other etiologies for Check CBC, CMP, LDH, iron, TIBC ferritin, multiple myeloma panel, light chain ratio, B12, folate.   We discussed about the possibility of need of IV Venofer treatments depending on her iron level.  Rationale and potential side effects were reviewed and discussed with patient.  She agrees if she needs IV iron infusion.  CKD (chronic kidney disease) stage 4, GFR 15-29 ml/min (HCC) Encourage oral hydration.  Avoid nephrotoxins. Available labs reviewed at the time of dictation. Iron panel is not consistent with iron deficiency.   Hemoglobin is above 10.  Hold off erythropoietin therapy.  Orders Placed This Encounter  Procedures   Ferritin    Standing Status:   Future    Number of Occurrences:   1    Standing Expiration Date:   10/11/2022   Folate    Standing Status:   Future    Number of Occurrences:   1    Standing Expiration Date:   04/11/2023   Vitamin B12     Standing Status:   Future    Number of Occurrences:   1    Standing Expiration Date:   04/11/2023   Technologist smear review    Standing Status:   Future    Number of Occurrences:   1    Standing Expiration Date:   04/11/2023   Comprehensive metabolic panel    Standing Status:   Future    Number of Occurrences:   1    Standing Expiration Date:   04/11/2023   CBC with Differential/Platelet    Standing Status:   Future    Number of Occurrences:   1    Standing Expiration Date:   04/11/2023   Lactate dehydrogenase    Standing Status:   Future    Number of Occurrences:   1    Standing Expiration Date:   04/11/2023   Iron and TIBC    Standing Status:   Future    Number of Occurrences:   1    Standing Expiration Date:   04/11/2023   Retic Panel    Standing Status:   Future    Number of Occurrences:   1    Standing Expiration Date:   04/11/2023   Multiple Myeloma Panel (SPEP&IFE w/QIG)    Standing Status:   Future    Number of Occurrences:   1    Standing Expiration Date:   04/11/2023   Kappa/lambda light chains    Standing Status:   Future    Number of Occurrences:   1    Standing Expiration Date:   04/11/2023   Recommend patient to  follow-up in 3 to 4 weeks to review results. All questions were answered. The patient knows to call the clinic with any problems, questions or concerns.  Earlie Server, MD, PhD [facility] Hematology Oncology 04/10/2022     HISTORY OF PRESENTING ILLNESS:  [name] is a  66 y.o.  female with PMH listed below who was referred to me for anemia Reviewed patient's recent labs that was done.  She was found to have abnormal CBC on 01/15/2022 with hemoglobin 9.6, MCV 85.4, normal WBC and platelet counts and differential. Reviewed patient's previous labs ordered by primary care physician's office, anemia is chronic onset , duration is since 2019. Patient has chronic kidney disease, stage IV, felt to be secondary to diabetes. Patient reports fatigue. She denies  recent chest pain on exertion, shortness of breath on minimal exertion, pre-syncopal episodes, or palpitations She had not noticed any recent bleeding such as epistaxis, hematuria or hematochezia.  Her last colonoscopy was in 2016. She denies any pica and eats a variety of diet.    MEDICAL HISTORY:  Past Medical History:  Diagnosis Date   Acute myocardial infarction, subendocardial infarction, initial episode of care ([place]) 07/21/2012   Acute osteomyelitis involving ankle and foot ([place]) 08/81/1031   Acute systolic heart failure ([place]) 07/21/2012   New onset 07/19/12; admission to [facility] ED. Elevated Troponins.  S/p 2D-echo with EF 20-25%.  S/p cardiac catheterization with stenting LAD.  Repeat 2D-echo 10/2011 with improved EF of 35%.    Anemia    Automatic implantable cardioverter-defibrillator in situ    a. MDT CRT-D 06/2014, SN: [id number]  .removed in feb 2020   CAD (coronary artery disease)    a. cardiac cath 101/04/2012: PCI/DES to chronically occluded mLAD, consideration PCI to diag branch in 4 weeks.    Cataract    Chicken pox    Chronic kidney disease    Chronic systolic CHF (congestive heart failure) (HCC)    a. mixed ICM & NICM; b. EF 20-25% by echo 07/2012, mid-dist 2/3 of LV sev HK/AK, mild MR. echo 10/2012: EF 30-35%, sev HK ant-septal & inf walls, GR1DD, mild MR, PASP 33. c. echo 02/2013: EF 30%, GR1DD, mild MR. echo 04/2014: EF 30%, Septal-lat dyssynchrony, global HK, inf AK, GR1DD, mild MR. d. echo 10/2014: EF50-55%, WM nl, GR1DD, septal mild paradox. e. echo 02/2015: EF 50-55%, wm    Depression    Heart attack ([place])    Heel ulcer ([place]) 04/27/2015   History of blood transfusion ~ 2011   "plasma; had neuropathy; couldn't walk"   Hypertension    Hypothyroidism    LBBB (left bundle branch block)    Neuromuscular disorder ([place])    Neuropathy 2011   Obesity, unspecified    OSA on CPAP    Moderate with AHI 23/hr and now on CPAP at 16cm H2O.  Her DME is [facility]   Pure  hypercholesterolemia    Sepsis ([place]) 09/2018   Type II diabetes mellitus ([place])    Unspecified vitamin D deficiency     SURGICAL HISTORY: Past Surgical History:  Procedure Laterality Date   ABDOMINAL AORTOGRAM W/LOWER EXTREMITY N/A 02/02/2021   Procedure: ABDOMINAL AORTOGRAM W/LOWER EXTREMITY;  Surgeon: Angelia Mould, MD;  Location: [place] CV LAB;  Service: Cardiovascular;  Laterality: N/A;   AMPUTATION Left 03/02/2021   Procedure: AMPUTATION RAY, left great toe;  Surgeon: Trula Slade, DPM;  Location: [place];  Service: Podiatry;  Laterality: Left;   AMPUTATION TOE Right 06/18/2015  Procedure: AMPUTATION TOE;  Surgeon: Samara Deist, DPM;  Location: [facility] ORS;  Service: Podiatry;  Laterality: Right;   AMPUTATION TOE Left 10/08/2018   Procedure: AMPUTATION TOE LEFT 2ND;  Surgeon: Albertine Patricia, DPM;  Location: [facility] ORS;  Service: Podiatry;  Laterality: Left;   BACK SURGERY     BI-VENTRICULAR IMPLANTABLE CARDIOVERTER DEFIBRILLATOR N/A 07/06/2014   Procedure: BI-VENTRICULAR IMPLANTABLE CARDIOVERTER DEFIBRILLATOR  (CRT-D);  Surgeon: Deboraha Sprang, MD;  Location: [facility] CATH LAB;  Service: Cardiovascular;  Laterality: N/A;   BI-VENTRICULAR IMPLANTABLE CARDIOVERTER DEFIBRILLATOR  (CRT-D)  07/06/2014   BILATERAL OOPHORECTOMY  01/2011   ovarian cyst benign   BIV ICD INSERTION CRT-D N/A 03/31/2019   Procedure: BIV ICD INSERTION CRT-D;  Surgeon: Constance Haw, MD;  Location: [place] CV LAB;  Service: Cardiovascular;  Laterality: N/A;   COLONOSCOPY WITH PROPOFOL Left 02/22/2015   Procedure: COLONOSCOPY WITH PROPOFOL;  Surgeon: Hulen Luster, MD;  Location: [facility] ENDOSCOPY;  Service: Endoscopy;  Laterality: Left;   CORONARY ANGIOPLASTY WITH STENT PLACEMENT Left 07/2012   new onset systolic CHF; elevated troponins.  Cardiac catheterization with stenting to LAD; EF 15%.  2D-echo: EF 20-25%.   ESOPHAGOGASTRODUODENOSCOPY N/A 02/22/2015   Procedure: ESOPHAGOGASTRODUODENOSCOPY (EGD);   Surgeon: Hulen Luster, MD;  Location: [facility] ENDOSCOPY;  Service: Endoscopy;  Laterality: N/A;   I & D EXTREMITY Left 11/28/2020   Procedure: IRRIGATION AND DEBRIDEMENT OF FOOT;  Surgeon: Trula Slade, DPM;  Location: [place];  Service: Podiatry;  Laterality: Left;   I & D EXTREMITY Left 01/31/2021   Procedure: IRRIGATION AND DEBRIDEMENT EXTREMITY OF LEFT FOOT WITH BONE BIOPSY OF  1ST METATARSAL AND FIFTH METATARSAL;  Surgeon: Felipa Furnace, DPM;  Location: [place];  Service: Podiatry;  Laterality: Left;   I & D EXTREMITY Left 02/03/2021   Procedure: IRRIGATION AND DEBRIDEMENT EXTREMITY WITH INTERNAL AMPUTATION FIRST AND FIFTH RAY;  Surgeon: Felipa Furnace, DPM;  Location: [place];  Service: Podiatry;  Laterality: Left;   INCISION AND DRAINAGE ABSCESS Right 2007   groin; with ICU stay due to sepsis.   IR FLUORO GUIDE CV LINE RIGHT  03/06/2021   IR REMOVAL TUN CV CATH W/O FL  04/27/2021   IR US GUIDE VASC ACCESS RIGHT  03/06/2021   IRRIGATION AND DEBRIDEMENT FOOT Left 04/25/2021   Procedure: EXCISION AND DEBRIDEMENT WOUND ULCER LEFT FOOT; METATARSECTOMY;  Surgeon: Trula Slade, DPM;  Location: [facility] OR[facility];  Service: Podiatry;  Laterality: Left;   LAPAROSCOPIC CHOLECYSTECTOMY  2011   LEFT HEART CATH AND CORONARY ANGIOGRAPHY N/A 10/05/2018   Procedure: LEFT HEART CATH AND CORONARY ANGIOGRAPHY;  Surgeon: Minna Merritts, MD;  Location: [place] CV LAB;  Service: Cardiovascular;  Laterality: N/A;   LEFT HEART CATHETERIZATION WITH CORONARY ANGIOGRAM N/A 07/21/2012   Procedure: LEFT HEART CATHETERIZATION WITH CORONARY ANGIOGRAM;  Surgeon: Jolaine Artist, MD;  Location: [facility] CATH LAB;  Service: Cardiovascular;  Laterality: N/A;   LUMBAR [place]   L4-5   PACEMAKER REMOVAL  11/2018   due to infection around pacemaker   [place] (PCI-S) N/A 07/23/2012   Procedure: PERCUTANEOUS CORONARY STENT INTERVENTION (PCI-S);  Surgeon: Sherren Mocha, MD;  Location: [facility] CATH  LAB;  Service: Cardiovascular;  Laterality: N/A;   PERIPHERAL VASCULAR BALLOON ANGIOPLASTY Left 10/06/2018   Procedure: PERIPHERAL VASCULAR BALLOON ANGIOPLASTY;  Surgeon: Katha Cabal, MD;  Location: [place] CV LAB;  Service: Cardiovascular;  Laterality: Left;   PERIPHERAL VASCULAR CATHETERIZATION N/A 02/10/2015  Procedure: Picc Line Insertion;  Surgeon: Katha Cabal, MD;  Location: [place] CV LAB;  Service: Cardiovascular;  Laterality: N/A;   RIGHT HEART CATH N/A 03/29/2019   Procedure: RIGHT HEART CATH;  Surgeon: Jolaine Artist, MD;  Location: [place] CV LAB;  Service: Cardiovascular;  Laterality: N/A;   TEE WITHOUT CARDIOVERSION N/A 10/02/2018   Procedure: TRANSESOPHAGEAL ECHOCARDIOGRAM (TEE);  Surgeon: Wellington Hampshire, MD;  Location: [facility] ORS;  Service: Cardiovascular;  Laterality: N/A;   [place]  01/2011   Fibroids/DUB.  Ovaries removed. [place].    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  Tobacco Use   Smoking status: Never   Smokeless tobacco: Never  Vaping Use   Vaping Use: Never used  Substance and Sexual Activity   Alcohol use: No    Alcohol/week: 0.0 standard drinks of alcohol   Drug use: No   Sexual activity: Not Currently    Birth control/protection: Post-menopausal, Surgical  Other Topics Concern   Not on file  Social History Narrative   Widowed, husband died when she was in surgery.        Children:  2 children (daughter, son). Two grandsons and 2 step grandchildren.      Lives: with husband, daughter, son-in-law, 2 grandsons, and 1 on the way.      Employment:  disability for peripheral neuropathy 2012.  Previously had home daycare.   Social Determinants of Health   Financial Resource Strain: Low Risk  (03/21/2021)   Overall Financial Resource Strain (CARDIA)    Difficulty of Paying Living Expenses: Not hard at all  Food Insecurity: No Food Insecurity (03/21/2021)   Hunger Vital Sign    Worried About Running Out of Food in the Last Year: Never true    Ran Out of Food in the Last Year: Never true  Transportation Needs: No Transportation Needs (03/21/2021)   PRAPARE - Hydrologist (Medical): No    Lack of Transportation (Non-Medical): No  Physical Activity: Inactive (03/21/2021)   Exercise Vital Sign    Days of Exercise per Week: 0 days    Minutes of Exercise per Session: 0 min  Stress: No Stress Concern Present (03/21/2021)   Max    Feeling of Stress : Not at all  Social Connections: Somewhat Isolated (09/08/2017)   Social Connection and Isolation Panel [NHANES]    Frequency of Communication with Friends and Family: More than three times a week    Frequency of Social Gatherings with Friends and Family: More than three times a week    Attends Religious Services: Never    Marine scientist or Organizations: No    Attends Archivist Meetings: Never    Marital Status: Married  Human resources officer Violence: Not At Risk (03/21/2021)   Humiliation, Afraid, Rape, and Kick questionnaire    Fear of Current or Ex-Partner: No    Emotionally Abused: No    Physically Abused: No    Sexually Abused: No    FAMILY HISTORY: Family History  Problem Relation Age of Onset   Diabetes Mother    Hypertension Mother    Arthritis Mother        knees, lumbar DDD, cervical DDD   Cancer Father        prostate,skin,lymphoma.   Cancer Brother 60       bladder cancer; non-smoker   Diabetes Maternal Grandmother    Heart disease Maternal Grandmother     COPD Maternal Grandmother    Diabetes Paternal Grandmother    Obesity Brother    Diabetes Son    Hypertension Son    Cancer Maternal Grandfather    Diabetes Paternal Grandfather     ALLERGIES:  is allergic to heparin and nsaids.  MEDICATIONS:  Current Outpatient Medications  Medication Sig Dispense Refill   acetaminophen (TYLENOL) 500 MG tablet Take 1,500 mg by mouth every 6 (six) hours as needed for moderate pain or headache.     amiodarone (PACERONE) 200 MG tablet TAKE 1/2 TABLET BY MOUTH EVERY OTHER DAY     aspirin EC 81 MG EC tablet Take 1 tablet (81 mg total) by mouth daily. Swallow whole. 30 tablet 11   atorvastatin (LIPITOR) 10 MG tablet TAKE 1 TABLET BY MOUTH ONCE DAILY 30 tablet 11   calcitRIOL (ROCALTROL) 0.5 MCG capsule Take 0.5 mcg by mouth every morning.     Calcium Carb-Cholecalciferol (CALCIUM CARBONATE-VITAMIN D3) 600-400 MG-UNIT TABS Take 1 tablet by mouth 2 (two) times a day.     carvedilol (COREG) 3.125 MG tablet TAKE 1 TABLET BY MOUTH TWICE (2) DAILY WITH A MEAL 180 tablet 2   clopidogrel (PLAVIX) 75 MG tablet Take 1 tablet (75 mg total) by mouth daily. 30 tablet 3   Continuous Blood Gluc Receiver (FREESTYLE LIBRE 2 READER) DEVI Use to check sugar multiple times per day as directed.  H/o DM2, treated with insulin. 1 each 0   Continuous Blood Gluc Receiver (FREESTYLE LIBRE 2 READER) DEVI FreeStyle Libre 2 Reader     docusate sodium (COLACE) 100 MG capsule Take 100 mg by mouth daily.     DULoxetine (CYMBALTA) 30 MG capsule TAKE 1 CAPSULE BY MOUTH EVERY NIGHT AT BEDTIME 90 capsule 0   gabapentin (NEURONTIN) 300 MG capsule Take 1 capsule (300 mg total) by mouth daily.     gabapentin (NEURONTIN) 600 MG tablet Take 1,200 mg by mouth at bedtime.     Insulin Aspart FlexPen (NOVOLOG) 100 UNIT/ML Inject 0-10 Units into the skin 3 (three) times daily before meals. Per sliding scale     insulin glargine (LANTUS) 100 UNIT/ML injection Inject 18 Units into the skin daily.      Insulin Pen Needle (PEN NEEDLES) 32G X 6 MM MISC 1 each by Does not apply route 4 (four) times daily. 150 each 11   isosorbide mononitrate (IMDUR) 30 MG 24 hr tablet TAKE 1 TABLET BY MOUTH ONCE DAILY 90 tablet 3   levothyroxine (SYNTHROID) 50 MCG tablet TAKE 1 TABLET BY MOUTH ONCE DAILY BEFOREBREAKFAST 90 tablet 1   losartan (COZAAR) 25 MG tablet TAKE 1/2 TABLET BY MOUTH DAILY 45 tablet 4   Multiple Vitamin (MULTIVITAMIN WITH MINERALS) TABS tablet Take 1 tablet by mouth every morning.     nitroGLYCERIN (NITROSTAT) 0.4 MG SL tablet Place 1 tablet (0.4 mg total) under the tongue every 5 (five) minutes as needed for chest pain. 25 tablet 3   Omega-3 Fatty Acids (FISH OIL) 1000 MG CAPS Take 1,000 mg by mouth 2 (two) times a day.  torsemide (DEMADEX) 20 MG tablet TAKE 1 TABLET BY MOUTH ONCE DAILY 30 tablet 11   traMADol (ULTRAM) 50 MG tablet TAKE 1 TO 2 TABLET BY MOUTH AT BEDTIME AS NEEDED 60 tablet 2   Semaglutide,0.25 or 0.5MG/DOS, (OZEMPIC, 0.25 OR 0.5 MG/DOSE,) 2 MG/1.5ML SOPN Inject 0.5 mg into the skin once a week. (Patient not taking: Reported on 04/05/2022) 4.5 mL 1   No current facility-administered medications for this visit.    Review of Systems  Constitutional:  Positive for fatigue. Negative for appetite change, chills and fever.  HENT:   Negative for hearing loss and voice change.   Eyes:  Negative for eye problems.  Respiratory:  Negative for chest tightness and cough.   Cardiovascular:  Negative for chest pain.  Gastrointestinal:  Negative for abdominal distention, abdominal pain and blood in stool.  Endocrine: Negative for hot flashes.  Genitourinary:  Negative for difficulty urinating and frequency.   Musculoskeletal:  Negative for arthralgias.  Skin:  Negative for itching and rash.  Neurological:  Negative for extremity weakness.  Hematological:  Negative for adenopathy.  Psychiatric/Behavioral:  Negative for confusion.     PHYSICAL EXAMINATION: ECOG PERFORMANCE STATUS:  1 - Symptomatic but completely ambulatory Vitals:   04/10/22 1128  BP: (!) 140/58  Pulse: (!) 58  Temp: (!) 96.6 F (35.9 C)   Filed Weights   04/10/22 1128  Weight: 252 lb (114.3 kg)    Physical Exam Constitutional:      General: She is not in acute distress.    Appearance: She is obese.  HENT:     Head: Normocephalic and atraumatic.  Eyes:     General: No scleral icterus. Cardiovascular:     Rate and Rhythm: Normal rate and regular rhythm.     Heart sounds: Normal heart sounds.  Pulmonary:     Effort: Pulmonary effort is normal. No respiratory distress.     Breath sounds: No wheezing.  Abdominal:     General: Bowel sounds are normal. There is no distension.     Palpations: Abdomen is soft.  Musculoskeletal:        General: No deformity. Normal range of motion.     Cervical back: Normal range of motion and neck supple.  Skin:    General: Skin is warm and dry.     Findings: No erythema or rash.  Neurological:     Mental Status: She is alert and oriented to person, place, and time. Mental status is at baseline.     Cranial Nerves: No cranial nerve deficit.     Coordination: Coordination normal.  Psychiatric:        Mood and Affect: Mood normal.      LABORATORY DATA:  I have reviewed the data as listed    Latest Ref Rng & Units 04/10/2022   11:55 AM 01/15/2022    3:49 PM 10/12/2021    5:54 PM  CBC  WBC 4.0 - 10.5 K/uL 7.5  7.3  8.0   Hemoglobin 12.0 - 15.0 g/dL 11.1  9.6  9.4   Hematocrit 36.0 - 46.0 % 35.0  29.3  31.0   Platelets 150 - 400 K/uL 210  220.0  309        Latest Ref Rng & Units 04/10/2022   11:55 AM 01/30/2022   12:43 PM 01/15/2022    3:49 PM  CMP  Glucose 70 - 99 mg/dL 218  354  178   BUN 8 - 23 mg/dL 49  44  49  Creatinine 0.44 - 1.00 mg/dL 2.17  2.11  3.04   Sodium 135 - 145 mmol/L 138  138  136   Potassium 3.5 - 5.1 mmol/L 4.4  4.1  4.4   Chloride 98 - 111 mmol/L 99  97  96   CO2 22 - 32 mmol/L 29  31  32   Calcium 8.9 - 10.3 mg/dL 8.8   9.7  10.9   Total Protein 6.5 - 8.1 g/dL 7.3     Total Bilirubin 0.3 - 1.2 mg/dL 0.4     Alkaline Phos 38 - 126 U/L 76     AST 15 - 41 U/L 19     ALT 0 - 44 U/L 15          Component Value Date/Time   IRON 55 04/10/2022 1155   IRON 39 02/03/2019 1611   TIBC 259 04/10/2022 1155   TIBC 285 02/03/2019 1611   FERRITIN 305 04/10/2022 1155   FERRITIN 239 (H) 02/03/2019 1611   IRONPCTSAT 21 04/10/2022 1155   IRONPCTSAT 14 (L) 02/03/2019 1611   IRONPCTSAT 6 (L) 12/30/2014 1840     RADIOGRAPHIC STUDIES: I have personally reviewed the radiological images as listed and agreed with the findings in the report. CUP PACEART REMOTE DEVICE CHECK  Result Date: 02/04/2022 Scheduled remote reviewed. Normal device function.  Optivol crossed threshold 4/16 and is ongoing Next remote 91 days. LA

## 2022-04-10 NOTE — Assessment & Plan Note (Signed)
Encourage oral hydration. Avoid nephrotoxins. 

## 2022-04-10 NOTE — Assessment & Plan Note (Signed)
Labs reviewed and discussed with patient.  Anemia is likely secondary to chronic kidney disease.  Rule out other etiologies for Check CBC, CMP, LDH, iron, TIBC ferritin, multiple myeloma panel, light chain ratio, B12, folate.   We discussed about the possibility of need of IV Venofer treatments depending on her iron level.  Rationale and potential side effects were reviewed and discussed with patient.  She agrees if she needs IV iron infusion.

## 2022-04-11 ENCOUNTER — Telehealth: Payer: Self-pay

## 2022-04-11 ENCOUNTER — Telehealth: Payer: Self-pay | Admitting: Oncology

## 2022-04-11 LAB — KAPPA/LAMBDA LIGHT CHAINS
Kappa free light chain: 86.1 mg/L — ABNORMAL HIGH (ref 3.3–19.4)
Kappa, lambda light chain ratio: 1.32 (ref 0.26–1.65)
Lambda free light chains: 65.4 mg/L — ABNORMAL HIGH (ref 5.7–26.3)

## 2022-04-11 NOTE — Telephone Encounter (Signed)
Called and left detailed message for patient informing her of some lab results that have results and Dr. Collie Siad recommendation to observe for now and follow up with her in 3-4 weeks to go over results. Patient scheduled for 05/09/2022 at 10:15. Advised patient to call back with any questions or concerns.

## 2022-04-11 NOTE — Telephone Encounter (Signed)
pt called in stating that she is having knee surgery, on 08/01 and would like to cancel and call for appt after her surgery. I offered to have her appt moved to before surgery and she declined. will give [place] a call .Marland KitchenKJ

## 2022-04-11 NOTE — Telephone Encounter (Signed)
-----   Message from Earlie Server, MD sent at 04/10/2022  7:59 PM EDT ----- Please let patient know that her hemoglobin showed improvement of hemoglobin to 11.1.  Iron panels are adequate she does not need IV iron treatment at this point.  I recommend observation. Some labs are still pending.  Please schedule patient to do a MD follow-up in 3 to 4 weeks to go over results.

## 2022-04-15 LAB — MULTIPLE MYELOMA PANEL, SERUM
Albumin SerPl Elph-Mcnc: 3.4 g/dL (ref 2.9–4.4)
Albumin/Glob SerPl: 1.1 (ref 0.7–1.7)
Alpha 1: 0.3 g/dL (ref 0.0–0.4)
Alpha2 Glob SerPl Elph-Mcnc: 0.9 g/dL (ref 0.4–1.0)
B-Globulin SerPl Elph-Mcnc: 1 g/dL (ref 0.7–1.3)
Gamma Glob SerPl Elph-Mcnc: 1.1 g/dL (ref 0.4–1.8)
Globulin, Total: 3.3 g/dL (ref 2.2–3.9)
IgA: 269 mg/dL (ref 87–352)
IgG (Immunoglobin G), Serum: 1063 mg/dL (ref 586–1602)
IgM (Immunoglobulin M), Srm: 193 mg/dL (ref 26–217)
Total Protein ELP: 6.7 g/dL (ref 6.0–8.5)

## 2022-04-18 ENCOUNTER — Other Ambulatory Visit: Payer: Self-pay | Admitting: Internal Medicine

## 2022-04-23 ENCOUNTER — Ambulatory Visit (INDEPENDENT_AMBULATORY_CARE_PROVIDER_SITE_OTHER): Payer: HMO | Admitting: Family Medicine

## 2022-04-23 ENCOUNTER — Encounter: Payer: Self-pay | Admitting: Internal Medicine

## 2022-04-23 ENCOUNTER — Encounter: Payer: Self-pay | Admitting: Family Medicine

## 2022-04-23 VITALS — BP 100/52 | HR 63 | Temp 97.3°F | Ht 68.0 in | Wt 252.0 lb

## 2022-04-23 DIAGNOSIS — M25569 Pain in unspecified knee: Secondary | ICD-10-CM | POA: Diagnosis not present

## 2022-04-23 DIAGNOSIS — E1142 Type 2 diabetes mellitus with diabetic polyneuropathy: Secondary | ICD-10-CM | POA: Diagnosis not present

## 2022-04-23 DIAGNOSIS — I25118 Atherosclerotic heart disease of native coronary artery with other forms of angina pectoris: Secondary | ICD-10-CM | POA: Diagnosis not present

## 2022-04-23 LAB — POCT GLYCOSYLATED HEMOGLOBIN (HGB A1C): Hemoglobin A1C: 6.4 % — AB (ref 4.0–5.6)

## 2022-04-23 MED ORDER — TORSEMIDE 20 MG PO TABS: 40.0000 mg | ORAL_TABLET | Freq: Every day | ORAL | Status: DC

## 2022-04-23 NOTE — Patient Instructions (Addendum)
Ask ortho about home health PT since it would be hard getting in and out of the house.  Let me check with your other docs in the meantime.  Take care.  Glad to see you.  If AM if below 100, then cut 1 unit of insulin a day.  If above 150, then add 1 unit.  If 100-150, then no change.    I'll update ortho.   Plan on recheck A1c in about 4 months at a visit.

## 2022-04-23 NOTE — Progress Notes (Unsigned)
Plan for R TKA.  D/w pt about preop eval.  No CP.  Not SOB.  Per cardiology,  "Overall low to moderate risk for peri-op CV complication with LE block. Ok to proceed without further testing".   She is miserable from knee pain.  She does not see any good options other than surgery.  She has failed previous treatment and cannot take NSAIDs.  D/w pt about taking 1/2 tab amiodarone every other day, had been taking 1 tab QOD.  On Plavix per Dr. Caryl Comes.    Diabetes:  Using medications without difficulties: yes Hypoglycemic episodes: cautions d/w pt.  Resolved with snack.   Hyperglycemic episodes: no Feet problems: minimal feeling in the feet.   Blood Sugars averaging: 101 this AM, that is typical for patient.   A1c 6.4.  Discussed with patient at office visit. She ran out of ozempic 1 month ago.  Discussed staying off med for now.   Meds, vitals, and allergies reviewed.   ROS: Per HPI unless specifically indicated in ROS section   Nad In wheelchair Ncat Neck supple, no LA Rrr Ctab Abd soft.  Skin well perfused. Right knee in brace. L foot with minimal plantar callous.  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  30 minutes were devoted to patient care in this encounter (this includes time spent reviewing the patient's file/history, interviewing and examining the patient, counseling/reviewing plan with patient).

## 2022-04-24 ENCOUNTER — Encounter: Payer: Self-pay | Admitting: Internal Medicine

## 2022-04-24 DIAGNOSIS — M25569 Pain in unspecified knee: Secondary | ICD-10-CM | POA: Insufficient documentation

## 2022-04-24 NOTE — Assessment & Plan Note (Signed)
Discussed that while I cannot "clear" the patient for surgery it does appear that she is appropriately low risk and it makes sense to proceed.  Her A1c is reasonable and we will update orthopedics.  I asked her to check with ortho about home health PT since it would be hard getting in and out of the house.

## 2022-04-24 NOTE — Assessment & Plan Note (Signed)
A1c is clearly better.  Continue insulin as is.  Would stay off Ozempic for now.  If AM if below 100, then cut 1 unit of insulin a day.  If above 150, then add 1 unit.  If 100-150, then no change.    Plan on recheck A1c in about 4 months at a visit.

## 2022-04-24 NOTE — Assessment & Plan Note (Addendum)
I routed this to Dr. Caryl Comes for input.  I updated her med list.  I need verification for him that she should be on 1/2 tablet of amiodarone every other day.  I will defer the timeline to hold Plavix and aspirin prior to surgery to Dr. Caryl Comes.

## 2022-04-25 ENCOUNTER — Encounter: Payer: Self-pay | Admitting: Internal Medicine

## 2022-04-25 NOTE — Progress Notes (Signed)
PERIOPERATIVE PRESCRIPTION FOR IMPLANTED CARDIAC DEVICE PROGRAMMING  Patient Information: Name:  ANGELISA WINTHROP  DOB:  08/09/1956  MRN:  [id number]    Planned Procedure:  R total knee replacement  Surgeon:  Paralee Cancel  Date of Procedure:  05-07-22  Cautery will be used. Yes  Position during surgery:  Supine   Please send documentation back to:  [facility] (Fax # [phone])  Device Information:  Clinic EP Physician:  Virl Axe, MD   Device Type:  Defibrillator Manufacturer and Phone #:  Medtronic: [phone] Pacemaker Dependent?:  Unknown  Date of Last Device Check:  04/05/22 Normal Device Function?:  Yes.    Electrophysiologist's Recommendations:  Have magnet available. Provide continuous ECG monitoring when magnet is used or reprogramming is to be performed.  Procedure should not interfere with device function.  No device programming or magnet placement needed.  Per Device Clinic Standing Orders, Simone Curia, RN  10:42 AM 04/25/2022

## 2022-04-25 NOTE — Progress Notes (Addendum)
COVID Vaccine Completed:  Yes  Date of COVID positive in last 90 days: No  PCP - Elsie Stain, MD Cardiologist - Ida Rogue, MD Electrophysiologist - Virl Axe, MD Advance Heart Failure - Glori Bickers, MD Nephrology - Munsoor Holley Raring, MD Neurology - Narda Amber, DO  Medical clearance in Epic dated 04-23-22 by Dr. Phillip Heal  Clearance from Dr. Haroldine Laws in [place] dated 04-05-22  Chest x-ray - 10-12-21 Epic EKG - 04-05-22 Epic Stress Test - greater than 2 years ECHO - 11-26-21 Epic Cardiac Cath - greater than 2 years Epic Pacemaker/ICD device last checked: 02-04-22 Epic Spinal Cord Stimulator:N/A  Bowel Prep - N/A  Sleep Study - Yes, +sleep apnea CPAP - Yes  Fasting Blood Sugar - 105 to 254 Checks Blood Sugar - 3- 4 times a day Freestyle Libre R arm  Blood Thinner Instructions:  Plavix.  Per patient to stop 5 days before   Aspirin Instructions:ASA 81. Pt stated that she is going to check with  with Dr. Haroldine Laws to see if safe to stop Last Dose:  Activity level:   Unable to climb stairs due to neuropathy and joint pain.  Able to perform activities of daily living without stopping and without symptoms of chest pain or shortness of breath.  Anesthesia review: CAD, Afib, CHF, LBBB, HTN, hx of MI, CKD, PAD  Patient denies shortness of breath, fever, cough and chest pain at PAT appointment  Patient verbalized understanding of instructions that were given to them at the PAT appointment. Patient was also instructed that they will need to review over the PAT instructions again at home before surgery.

## 2022-04-25 NOTE — Patient Instructions (Addendum)
DUE TO COVID-19 ONLY TWO VISITORS  (aged 68 and older)  IS ALLOWED TO COME WITH YOU AND STAY IN THE WAITING ROOM ONLY DURING PRE OP AND PROCEDURE.   **NO VISITORS ARE ALLOWED IN THE SHORT STAY AREA OR RECOVERY ROOM!!**  IF YOU WILL BE ADMITTED INTO THE HOSPITAL YOU ARE ALLOWED ONLY FOUR SUPPORT PEOPLE DURING VISITATION HOURS ONLY (7 AM -8PM)   The support person(s) must pass our screening, gel in and out Visitors GUEST BADGE MUST BE WORN VISIBLY  One adult visitor may remain with you overnight and MUST be in the room by 8 P.M.   You are not required to [employer] often Do NOT share personal items Notify your provider if you are in close contact with someone who has COVID or you develop fever 100.4 or greater, new onset of sneezing, cough, sore throat, shortness of breath or body aches.        Your procedure is scheduled on:  05-07-22   Report to [facility] Main Entrance    Report to admitting at 1:30 PM   Call this number if you have problems the morning of surgery [phone]   Do not eat food :After Midnight the night before surgery   After Midnight you may have the following liquids until 1:00 PM DAY OF SURGERY  Clear Liquid Diet Water Black Coffee (sugar ok, NO MILK/CREAM OR CREAMERS)  Tea (sugar ok, NO MILK/CREAM OR CREAMERS) regular and decaf                             Plain Jell-O (NO RED)                                           Fruit ices (not with fruit pulp, NO RED)                                     Popsicles (NO RED)                                                                  Juice: apple, WHITE grape, WHITE cranberry Sports drinks like Gatorade (NO RED)                    The day of surgery:  Drink ONE (1) Pre-Surgery G2 at 1:00 PM the morning of surgery. Drink in one sitting. Do not sip.  This drink was given to you during your hospital  pre-op appointment visit. Nothing else to drink after completing the Pre-Surgery G2.           If you have questions, please contact your surgeon's office.   FOLLOW  ANY ADDITIONAL PRE OP INSTRUCTIONS YOU RECEIVED FROM YOUR SURGEON'S OFFICE!!!     Oral Hygiene is also important to reduce your risk of infection.  Remember - BRUSH YOUR TEETH THE MORNING OF SURGERY WITH YOUR REGULAR TOOTHPASTE   Do NOT smoke after Midnight  Take these medicines the morning of surgery with A SIP OF WATER: Amiodarone, Atorvastatin, Carvedilol, Isosorbide, Levothyroxine.  Okay to take Tylenol if needed   DO NOT [place]. PHARMACY WILL DISPENSE MEDICATIONS LISTED ON YOUR MEDICATION LIST TO YOU DURING YOUR ADMISSION [place]!  How to Manage Your Diabetes Before and After Surgery  Why is it important to control my blood sugar before and after surgery? Improving blood sugar levels before and after surgery helps healing and can limit problems. A way of improving blood sugar control is eating a healthy diet by:  Eating less sugar and carbohydrates  Increasing activity/exercise  Talking with your doctor about reaching your blood sugar goals High blood sugars (greater than 180 mg/dL) can raise your risk of infections and slow your recovery, so you will need to focus on controlling your diabetes during the weeks before surgery. Make sure that the doctor who takes care of your diabetes knows about your planned surgery including the date and location.  How do I manage my blood sugar before surgery? Check your blood sugar at least 4 times a day, starting 2 days before surgery, to make sure that the level is not too high or low. Check your blood sugar the morning of your surgery when you wake up and every 2 hours until you get to the Short Stay unit. If your blood sugar is less than 70 mg/dL, you will need to treat for low blood sugar: Do not take insulin. Treat a low blood sugar (less than 70 mg/dL) with  cup of clear juice  (cranberry or apple), 4 glucose tablets, OR glucose gel. Recheck blood sugar in 15 minutes after treatment (to make sure it is greater than 70 mg/dL). If your blood sugar is not greater than 70 mg/dL on recheck, call [phone] for further instructions. Report your blood sugar to the short stay nurse when you get to Short Stay.  If you are admitted to the hospital after surgery: Your blood sugar will be checked by the staff and you will probably be given insulin after surgery (instead of oral diabetes medicines) to make sure you have good blood sugar levels. The goal for blood sugar control after surgery is 80-180 mg/dL.   WHAT DO I DO ABOUT MY DIABETES MEDICATION?  Do not take oral diabetes medicines (pills) the morning of surgery.  THE NIGHT BEFORE SURGERY:  Take 9 units of Lantus insulin at bedtime.       THE MORNING OF SURGERY:  Take 50% of Novolog insulin if CBG 220 or higher.  Reviewed and Endorsed by [facility] Patient Education Committee, August 2015   Bring CPAP mask and tubing day of surgery.                              You may not have any metal on your body including hair pins, jewelry, and body piercing             Do not wear make-up, lotions, powders, perfumes or deodorant  Do not wear nail polish including gel and S&S, artificial/acrylic nails, or any other type of covering on natural nails including finger and toenails. If you have artificial nails, gel coating, etc. that needs to be removed by a nail salon please have this removed prior  to surgery or surgery may need to be canceled/ delayed if the surgeon/ anesthesia feels like they are unable to be safely monitored.   Do not shave  48 hours prior to surgery.    Contacts, dentures or bridgework may not be worn into surgery.   Bring small overnight bag day of surgery.  Do not bring valuables to the hospital. [place].   Special Instructions: Bring a copy of your healthcare  power of attorney and living will documents the day of surgery if you haven't scanned them before.  Please read over the following fact sheets you were given: IF YOU HAVE QUESTIONS ABOUT YOUR PRE-OP INSTRUCTIONS PLEASE CALL [place] - Preparing for Surgery Before surgery, you can play an important role.  Because skin is not sterile, your skin needs to be as free of germs as possible.  You can reduce the number of germs on your skin by washing with CHG (chlorahexidine gluconate) soap before surgery.  CHG is an antiseptic cleaner which kills germs and bonds with the skin to continue killing germs even after washing. Please DO NOT use if you have an allergy to CHG or antibacterial soaps.  If your skin becomes reddened/irritated stop using the CHG and inform your nurse when you arrive at Short Stay. Do not shave (including legs and underarms) for at least 48 hours prior to the first CHG shower.  You may shave your face/neck.  Please follow these instructions carefully:  1.  Shower with CHG Soap the night before surgery and the  morning of surgery.  2.  If you choose to wash your hair, wash your hair first as usual with your normal  shampoo.  3.  After you shampoo, rinse your hair and body thoroughly to remove the shampoo.                             4.  Use CHG as you would any other liquid soap.  You can apply chg directly to the skin and wash.  Gently with a scrungie or clean washcloth.  5.  Apply the CHG Soap to your body ONLY FROM THE NECK DOWN.   Do   not use on face/ open                           Wound or open sores. Avoid contact with eyes, ears mouth and   genitals (private parts).                       Wash face,  Genitals (private parts) with your normal soap.             6.  Wash thoroughly, paying special attention to the area where your    surgery  will be performed.  7.  Thoroughly rinse your body with warm water from the neck down.  8.  DO NOT shower/wash with  your normal soap after using and rinsing off the CHG Soap.                9.  Pat yourself dry with a clean towel.            10.  Wear clean pajamas.            11.  Place clean sheets on your bed the night of your  first shower and do not  sleep with pets. Day of Surgery : Do not apply any lotions/deodorants the morning of surgery.  Please wear clean clothes to the hospital/surgery center.  FAILURE TO FOLLOW THESE INSTRUCTIONS MAY RESULT IN THE CANCELLATION OF YOUR SURGERY  PATIENT SIGNATURE_________________________________  NURSE SIGNATURE__________________________________  ________________________________________________________________________     [name]  An incentive spirometer is a tool that can help keep your lungs clear and active. This tool measures how well you are filling your lungs with each breath. Taking long deep breaths may help reverse or decrease the chance of developing breathing (pulmonary) problems (especially infection) following: A long period of time when you are unable to move or be active. BEFORE THE PROCEDURE  If the spirometer includes an indicator to show your best effort, your nurse or respiratory therapist will set it to a desired goal. If possible, sit up straight or lean slightly forward. Try not to slouch. Hold the incentive spirometer in an upright position. INSTRUCTIONS FOR USE  Sit on the edge of your bed if possible, or sit up as far as you can in bed or on a chair. Hold the incentive spirometer in an upright position. Breathe out normally. Place the mouthpiece in your mouth and seal your lips tightly around it. Breathe in slowly and as deeply as possible, raising the piston or the ball toward the top of the column. Hold your breath for 3-5 seconds or for as long as possible. Allow the piston or ball to fall to the bottom of the column. Remove the mouthpiece from your mouth and breathe out normally. Rest for a few seconds and repeat  Steps 1 through 7 at least 10 times every 1-2 hours when you are awake. Take your time and take a few normal breaths between deep breaths. The spirometer may include an indicator to show your best effort. Use the indicator as a goal to work toward during each repetition. After each set of 10 deep breaths, practice coughing to be sure your lungs are clear. If you have an incision (the cut made at the time of surgery), support your incision when coughing by placing a pillow or rolled up towels firmly against it. Once you are able to get out of bed, walk around indoors and cough well. You may stop using the incentive spirometer when instructed by your caregiver.  RISKS AND COMPLICATIONS Take your time so you do not get dizzy or light-headed. If you are in pain, you may need to take or ask for pain medication before doing incentive spirometry. It is harder to take a deep breath if you are having pain. AFTER USE Rest and breathe slowly and easily. It can be helpful to keep track of a log of your progress. Your caregiver can provide you with a simple table to help with this. If you are using the spirometer at home, follow these instructions: [place] IF:  You are having difficultly using the spirometer. You have trouble using the spirometer as often as instructed. Your pain medication is not giving enough relief while using the spirometer. You develop fever of 100.5 F (38.1 C) or higher. SEEK IMMEDIATE MEDICAL CARE IF:  You cough up bloody sputum that had not been present before. You develop fever of 102 F (38.9 C) or greater. You develop worsening pain at or near the incision site. MAKE SURE YOU:  Understand these instructions. Will watch your condition. Will get help right away if you are not doing well  or get worse. Document Released: 02/03/2007 Document Revised: 12/16/2011 Document Reviewed: 04/06/2007 ExitCare Patient Information 2014 ExitCare,  [place].   ________________________________________________________________________  WHAT IS A BLOOD TRANSFUSION? Blood Transfusion Information  A transfusion is the replacement of blood or some of its parts. Blood is made up of multiple cells which provide different functions. Red blood cells carry oxygen and are used for blood loss replacement. White blood cells fight against infection. Platelets control bleeding. Plasma helps clot blood. Other blood products are available for specialized needs, such as hemophilia or other clotting disorders. BEFORE THE TRANSFUSION  Who gives blood for transfusions?  Healthy volunteers who are fully evaluated to make sure their blood is safe. This is blood bank blood. Transfusion therapy is the safest it has ever been in the practice of medicine. Before blood is taken from a donor, a complete history is taken to make sure that person has no history of diseases nor engages in risky social behavior (examples are intravenous drug use or sexual activity with multiple partners). The donor's travel history is screened to minimize risk of transmitting infections, such as malaria. The donated blood is tested for signs of infectious diseases, such as HIV and hepatitis. The blood is then tested to be sure it is compatible with you in order to minimize the chance of a transfusion reaction. If you or a relative donates blood, this is often done in anticipation of surgery and is not appropriate for emergency situations. It takes many days to process the donated blood. RISKS AND COMPLICATIONS Although transfusion therapy is very safe and saves many lives, the main dangers of transfusion include:  Getting an infectious disease. Developing a transfusion reaction. This is an allergic reaction to something in the blood you were given. Every precaution is taken to prevent this. The decision to have a blood transfusion has been considered carefully by your caregiver before blood is  given. Blood is not given unless the benefits outweigh the risks. AFTER THE TRANSFUSION Right after receiving a blood transfusion, you will usually feel much better and more energetic. This is especially true if your red blood cells have gotten low (anemic). The transfusion raises the level of the red blood cells which carry oxygen, and this usually causes an energy increase. The nurse administering the transfusion will monitor you carefully for complications. HOME CARE INSTRUCTIONS  No special instructions are needed after a transfusion. You may find your energy is better. Speak with your caregiver about any limitations on activity for underlying diseases you may have. SEEK MEDICAL CARE IF:  Your condition is not improving after your transfusion. You develop redness or irritation at the intravenous (IV) site. SEEK IMMEDIATE MEDICAL CARE IF:  Any of the following symptoms occur over the next 12 hours: Shaking chills. You have a temperature by mouth above 102 F (38.9 C), not controlled by medicine. Chest, back, or muscle pain. People around you feel you are not acting correctly or are confused. Shortness of breath or difficulty breathing. Dizziness and fainting. You get a rash or develop hives. You have a decrease in urine output. Your urine turns a dark color or changes to pink, red, or brown. Any of the following symptoms occur over the next 10 days: You have a temperature by mouth above 102 F (38.9 C), not controlled by medicine. Shortness of breath. Weakness after normal activity. The white part of the eye turns yellow (jaundice). You have a decrease in the amount of urine or are urinating less often. Your  urine turns a dark color or changes to pink, red, or brown. Document Released: 09/20/2000 Document Revised: 12/16/2011 Document Reviewed: 05/09/2008 [facility] Patient Information 2014 [place], [place].  _______________________________________________________________________

## 2022-04-26 ENCOUNTER — Encounter (HOSPITAL_COMMUNITY)
Admission: RE | Admit: 2022-04-26 | Discharge: 2022-04-26 | Disposition: A | Discharge status: Home / Self Care | Payer: PPO | Source: Ambulatory Visit | Attending: Orthopedic Surgery | Admitting: Orthopedic Surgery

## 2022-04-26 ENCOUNTER — Encounter (HOSPITAL_COMMUNITY): Payer: Self-pay

## 2022-04-26 ENCOUNTER — Other Ambulatory Visit: Payer: Self-pay

## 2022-04-26 VITALS — BP 123/66 | HR 56 | Temp 98.3°F | Resp 18 | Ht 67.5 in | Wt 258.0 lb

## 2022-04-26 DIAGNOSIS — Z01818 Encounter for other preprocedural examination: Secondary | ICD-10-CM

## 2022-04-26 DIAGNOSIS — Z794 Long term (current) use of insulin: Secondary | ICD-10-CM | POA: Diagnosis not present

## 2022-04-26 DIAGNOSIS — M1711 Unilateral primary osteoarthritis, right knee: Secondary | ICD-10-CM | POA: Diagnosis not present

## 2022-04-26 DIAGNOSIS — E119 Type 2 diabetes mellitus without complications: Secondary | ICD-10-CM | POA: Diagnosis not present

## 2022-04-26 DIAGNOSIS — Z01812 Encounter for preprocedural laboratory examination: Secondary | ICD-10-CM | POA: Insufficient documentation

## 2022-04-26 HISTORY — DX: Unspecified osteoarthritis, unspecified site: M19.90

## 2022-04-26 LAB — TYPE AND SCREEN
ABO/RH(D): O POS
Antibody Screen: NEGATIVE

## 2022-04-26 LAB — GLUCOSE, CAPILLARY: Glucose-Capillary: 106 mg/dL — ABNORMAL HIGH (ref 70–99)

## 2022-04-26 LAB — SURGICAL PCR SCREEN
MRSA, PCR: NEGATIVE
Staphylococcus aureus: POSITIVE — AB

## 2022-04-29 ENCOUNTER — Other Ambulatory Visit (HOSPITAL_COMMUNITY): Payer: Self-pay | Admitting: Internal Medicine

## 2022-04-29 ENCOUNTER — Telehealth (HOSPITAL_COMMUNITY): Payer: Self-pay | Admitting: *Deleted

## 2022-04-29 NOTE — Telephone Encounter (Signed)
Pt aware.

## 2022-04-29 NOTE — Progress Notes (Signed)
[facility]+ results routed to Dr. Olin. 

## 2022-04-29 NOTE — Telephone Encounter (Signed)
Pt said she was told to hold plavix before surgery but she wasn't sure if she should hold aspirin as well.   Routed to [place] for advice

## 2022-04-30 NOTE — Anesthesia Preprocedure Evaluation (Addendum)
Anesthesia Evaluation  Patient identified by MRN, date of birth, ID band Patient awake    Reviewed: Allergy & Precautions, NPO status , Patient's Chart, lab work & pertinent test results  Airway Mallampati: III  TM Distance: >3 FB Neck ROM: Full    Dental no notable dental hx.    Pulmonary sleep apnea and Continuous Positive Airway Pressure Ventilation ,    Pulmonary exam normal        Cardiovascular hypertension, Pt. on home beta blockers and Pt. on medications + CAD, + Past MI, + Cardiac Stents and +CHF  Normal cardiovascular exam+ pacemaker + Cardiac Defibrillator   ECHO: Endocardium is not well seen Difficult to evaluate regional wall motion. . Left ventricular ejection fraction, by estimation, is 50 to 55%. The left ventricle has low normal function. Left ventricular diastolic parameters are consistent with Grade I diastolic dysfunction (impaired relaxation). Elevated left atrial pressure. Right ventricular systolic function is normal. The right ventricular size is normal. The mitral valve is normal in structure. Mild mitral valve regurgitation. The aortic valve is tricuspid. Aortic valve regurgitation is not visualized.   Neuro/Psych PSYCHIATRIC DISORDERS Depression  Neuromuscular disease    GI/Hepatic negative GI ROS, Neg liver ROS,   Endo/Other  diabetesHypothyroidism   Renal/GU Renal disease     Musculoskeletal  (+) Arthritis ,   Abdominal (+) + obese,   Peds  Hematology  (+) Blood dyscrasia, anemia ,   Anesthesia Other Findings Right knee osteoarthritis  Reproductive/Obstetrics                            Anesthesia Physical Anesthesia Plan  ASA: 3  Anesthesia Plan: Spinal and Regional   Post-op Pain Management:    Induction: Intravenous  PONV Risk Score and Plan: 2 and Ondansetron, Dexamethasone, Propofol infusion and Treatment may vary due to age or medical  condition  Airway Management Planned: Simple Face Mask  Additional Equipment:   Intra-op Plan:   Post-operative Plan:   Informed Consent: I have reviewed the patients History and Physical, chart, labs and discussed the procedure including the risks, benefits and alternatives for the proposed anesthesia with the patient or authorized representative who has indicated his/her understanding and acceptance.     Dental advisory given  Plan Discussed with: CRNA  Anesthesia Plan Comments: (Reviewed PAT note 04/26/2022)       Anesthesia Quick Evaluation

## 2022-04-30 NOTE — Progress Notes (Signed)
Anesthesia Chart Review   Case: [id number] Date/Time: 05/07/22 1545   Procedure: TOTAL KNEE ARTHROPLASTY (Right: Knee)   Anesthesia type: Spinal   Pre-op diagnosis: Right knee osteoarthritis   Location: [place] 09 / [facility]   Surgeons: Paralee Cancel, MD       DISCUSSION:66 y.o. never smoker with h/o HTN, DM II, LBBB, CAD (DES), CHF, pacemaker in place (device orders in 04/25/2022 progress note, procedure should not interfere), PVD (s/p angioplasty and stenting 2019, angioplasty 2022), OSA on CPAP, CKD IV baseline creatinine 2.0-2.5, right knee OA scheduled for above procedure 05/06/2022 with Dr. Paralee Cancel.   Per PCP pt is low risk for planned procedure.   Pt seen by cardiology 04/05/2022. Per OV note, "Overall low to moderate risk for peri-op CV complication with LE block. Ok to proceed without further testing"  Pt advised to hold Plavix 5 days prior to procedure.   Anticipate pt can proceed with planned procedure barring acute status change.   VS: BP 123/66   Pulse (!) 56   Temp 36.8 C (Oral)   Resp 18   Ht 5' 7.5" (1.715 m)   Wt 117 kg   SpO2 100%   BMI 39.81 kg/m   PROVIDERS: [name], MD is PCP   Cardiologist - Ida Rogue, MD Electrophysiologist - Virl Axe, MD Advance Heart Failure - Glori Bickers, MD Nephrology - Anthonette Legato, MD Neurology - Narda Amber, DO LABS: Labs reviewed: Acceptable for surgery. (all labs ordered are listed, but only abnormal results are displayed)  Labs Reviewed  SURGICAL PCR SCREEN - Abnormal; Notable for the following components:      Result Value   Staphylococcus aureus POSITIVE (*)    All other components within normal limits  GLUCOSE, CAPILLARY - Abnormal; Notable for the following components:   Glucose-Capillary 106 (*)    All other components within normal limits  TYPE AND SCREEN     IMAGES:   EKG: 04/05/2022 Rate 57 bpm  Atrial-sensed ventricular-paced rhythm Abnormal ECG When compared with ECG of  14-Nov-2021 15:47, Vent. rate has decreased BY 14 BPM No significant change since last tracing  CV: Echo 11/26/2021 1. Endocardium is not well seen Difficult to evaluate regional wall  motion. . Left ventricular ejection fraction, by estimation, is 50 to 55%.  The left ventricle has low normal function. Left ventricular diastolic  parameters are consistent with Grade I  diastolic dysfunction (impaired relaxation). Elevated left atrial  pressure.   2. Right ventricular systolic function is normal. The right ventricular  size is normal.   3. The mitral valve is normal in structure. Mild mitral valve  regurgitation.   4. The aortic valve is tricuspid. Aortic valve regurgitation is not  visualized.  Past Medical History:  Diagnosis Date   Acute myocardial infarction, subendocardial infarction, initial episode of care ([place]) 07/21/2012   Acute osteomyelitis involving ankle and foot ([place]) 17/61/6073   Acute systolic heart failure ([place]) 07/21/2012   New onset 07/19/12; admission to [facility] ED. Elevated Troponins.  S/p 2D-echo with EF 20-25%.  S/p cardiac catheterization with stenting LAD.  Repeat 2D-echo 10/2011 with improved EF of 35%.    Anemia    Arthritis    Automatic implantable cardioverter-defibrillator in situ    a. MDT CRT-D 06/2014, SN: [id number]  .removed in feb 2020   CAD (coronary artery disease)    a. cardiac cath 101/04/2012: PCI/DES to chronically occluded mLAD, consideration PCI to diag branch in 4 weeks.  Cataract    Chicken pox    Chronic kidney disease    Chronic systolic CHF (congestive heart failure) (HCC)    a. mixed ICM & NICM; b. EF 20-25% by echo 07/2012, mid-dist 2/3 of LV sev HK/AK, mild MR. echo 10/2012: EF 30-35%, sev HK ant-septal & inf walls, GR1DD, mild MR, PASP 33. c. echo 02/2013: EF 30%, GR1DD, mild MR. echo 04/2014: EF 30%, Septal-lat dyssynchrony, global HK, inf AK, GR1DD, mild MR. d. echo 10/2014: EF50-55%, WM nl, GR1DD, septal mild paradox. e. echo  02/2015: EF 50-55%, wm    Depression    Heart attack ([place])    Heel ulcer ([place]) 04/27/2015   History of blood transfusion ~ 2011   "plasma; had neuropathy; couldn't walk"   Hypertension    Hypothyroidism    LBBB (left bundle branch block)    Neuromuscular disorder ([place])    Neuropathy 2011   Obesity, unspecified    OSA on CPAP    Moderate with AHI 23/hr and now on CPAP at 16cm H2O.  Her DME is [facility]   Pure hypercholesterolemia    Sepsis ([place]) 09/2018   Type II diabetes mellitus ([place])    Unspecified vitamin D deficiency     Past Surgical History:  Procedure Laterality Date   ABDOMINAL AORTOGRAM W/LOWER EXTREMITY N/A 02/02/2021   Procedure: ABDOMINAL AORTOGRAM W/LOWER EXTREMITY;  Surgeon: Angelia Mould, MD;  Location: [place] CV LAB;  Service: Cardiovascular;  Laterality: N/A;   AMPUTATION Left 03/02/2021   Procedure: AMPUTATION RAY, left great toe;  Surgeon: Trula Slade, DPM;  Location: [place];  Service: Podiatry;  Laterality: Left;   AMPUTATION TOE Right 06/18/2015   Procedure: AMPUTATION TOE;  Surgeon: Samara Deist, DPM;  Location: [facility] ORS;  Service: Podiatry;  Laterality: Right;   AMPUTATION TOE Left 10/08/2018   Procedure: AMPUTATION TOE LEFT 2ND;  Surgeon: Albertine Patricia, DPM;  Location: [facility];  Service: Podiatry;  Laterality: Left;   BACK SURGERY     BI-VENTRICULAR IMPLANTABLE CARDIOVERTER DEFIBRILLATOR N/A 07/06/2014   Procedure: BI-VENTRICULAR IMPLANTABLE CARDIOVERTER DEFIBRILLATOR  (CRT-D);  Surgeon: Deboraha Sprang, MD;  Location: [facility] CATH LAB;  Service: Cardiovascular;  Laterality: N/A;   BI-VENTRICULAR IMPLANTABLE CARDIOVERTER DEFIBRILLATOR  (CRT-D)  07/06/2014   BILATERAL OOPHORECTOMY  01/2011   ovarian cyst benign   BIV ICD INSERTION CRT-D N/A 03/31/2019   Procedure: BIV ICD INSERTION CRT-D;  Surgeon: Constance Haw, MD;  Location: [place] CV LAB;  Service: Cardiovascular;  Laterality: N/A;   COLONOSCOPY WITH PROPOFOL Left 02/22/2015    Procedure: COLONOSCOPY WITH PROPOFOL;  Surgeon: Hulen Luster, MD;  Location: [facility] ENDOSCOPY;  Service: Endoscopy;  Laterality: Left;   CORONARY ANGIOPLASTY WITH STENT PLACEMENT Left 07/2012   new onset systolic CHF; elevated troponins.  Cardiac catheterization with stenting to LAD; EF 15%.  2D-echo: EF 20-25%.   ESOPHAGOGASTRODUODENOSCOPY N/A 02/22/2015   Procedure: ESOPHAGOGASTRODUODENOSCOPY (EGD);  Surgeon: Hulen Luster, MD;  Location: [facility] ENDOSCOPY;  Service: Endoscopy;  Laterality: N/A;   I & D EXTREMITY Left 11/28/2020   Procedure: IRRIGATION AND DEBRIDEMENT OF FOOT;  Surgeon: Trula Slade, DPM;  Location: [place];  Service: Podiatry;  Laterality: Left;   I & D EXTREMITY Left 01/31/2021   Procedure: IRRIGATION AND DEBRIDEMENT EXTREMITY OF LEFT FOOT WITH BONE BIOPSY OF  1ST METATARSAL AND FIFTH METATARSAL;  Surgeon: Felipa Furnace, DPM;  Location: [place];  Service: Podiatry;  Laterality: Left;   I & D EXTREMITY Left 02/03/2021  Procedure: IRRIGATION AND DEBRIDEMENT EXTREMITY WITH INTERNAL AMPUTATION FIRST AND FIFTH RAY;  Surgeon: Felipa Furnace, DPM;  Location: [place];  Service: Podiatry;  Laterality: Left;   INCISION AND DRAINAGE ABSCESS Right 2007   groin; with ICU stay due to sepsis.   IR FLUORO GUIDE CV LINE RIGHT  03/06/2021   IR REMOVAL TUN CV CATH W/O FL  04/27/2021   IR US GUIDE VASC ACCESS RIGHT  03/06/2021   IRRIGATION AND DEBRIDEMENT FOOT Left 04/25/2021   Procedure: EXCISION AND DEBRIDEMENT WOUND ULCER LEFT FOOT; METATARSECTOMY;  Surgeon: Trula Slade, DPM;  Location: [facility];  Service: Podiatry;  Laterality: Left;   LAPAROSCOPIC CHOLECYSTECTOMY  2011   LEFT HEART CATH AND CORONARY ANGIOGRAPHY N/A 10/05/2018   Procedure: LEFT HEART CATH AND CORONARY ANGIOGRAPHY;  Surgeon: Minna Merritts, MD;  Location: [place] CV LAB;  Service: Cardiovascular;  Laterality: N/A;   LEFT HEART CATHETERIZATION WITH CORONARY ANGIOGRAM N/A 07/21/2012   Procedure: LEFT HEART CATHETERIZATION WITH  CORONARY ANGIOGRAM;  Surgeon: Jolaine Artist, MD;  Location: [facility] CATH LAB;  Service: Cardiovascular;  Laterality: N/A;   LUMBAR [place]   L4-5   PACEMAKER REMOVAL  11/2018   due to infection around pacemaker   [place] (PCI-S) N/A 07/23/2012   Procedure: PERCUTANEOUS CORONARY STENT INTERVENTION (PCI-S);  Surgeon: Sherren Mocha, MD;  Location: [facility] CATH LAB;  Service: Cardiovascular;  Laterality: N/A;   PERIPHERAL VASCULAR BALLOON ANGIOPLASTY Left 10/06/2018   Procedure: PERIPHERAL VASCULAR BALLOON ANGIOPLASTY;  Surgeon: Katha Cabal, MD;  Location: [place] CV LAB;  Service: Cardiovascular;  Laterality: Left;   PERIPHERAL VASCULAR CATHETERIZATION N/A 02/10/2015   Procedure: Picc Line Insertion;  Surgeon: Katha Cabal, MD;  Location: [place] CV LAB;  Service: Cardiovascular;  Laterality: N/A;   RIGHT HEART CATH N/A 03/29/2019   Procedure: RIGHT HEART CATH;  Surgeon: Jolaine Artist, MD;  Location: [place] CV LAB;  Service: Cardiovascular;  Laterality: N/A;   TEE WITHOUT CARDIOVERSION N/A 10/02/2018   Procedure: TRANSESOPHAGEAL ECHOCARDIOGRAM (TEE);  Surgeon: [place] Hampshire, MD;  Location: [facility];  Service: Cardiovascular;  Laterality: N/A;   [place]  01/2011   Fibroids/DUB.  Ovaries removed. [place].    MEDICATIONS:  acetaminophen (TYLENOL) 500 MG tablet   amiodarone (PACERONE) 200 MG tablet   aspirin EC 81 MG EC tablet   atorvastatin (LIPITOR) 10 MG tablet   calcitRIOL (ROCALTROL) 0.5 MCG capsule   Calcium Carb-Cholecalciferol (CALCIUM CARBONATE-VITAMIN D3) 600-400 MG-UNIT TABS   carvedilol (COREG) 3.125 MG tablet   clopidogrel (PLAVIX) 75 MG tablet   Continuous Blood Gluc Receiver (FREESTYLE LIBRE 2 READER) DEVI   Continuous Blood Gluc Receiver (FREESTYLE LIBRE 2 READER) DEVI   docusate sodium (COLACE) 100 MG capsule   DULoxetine (CYMBALTA) 30 MG capsule   gabapentin  (NEURONTIN) 600 MG tablet   Insulin Aspart FlexPen (NOVOLOG) 100 UNIT/ML   insulin glargine (LANTUS) 100 UNIT/ML injection   Insulin Pen Needle (PEN NEEDLES) 32G X 6 MM MISC   isosorbide mononitrate (IMDUR) 30 MG 24 hr tablet   levothyroxine (SYNTHROID) 50 MCG tablet   losartan (COZAAR) 25 MG tablet   Multiple Vitamin (MULTIVITAMIN WITH MINERALS) TABS tablet   nitroGLYCERIN (NITROSTAT) 0.4 MG SL tablet   Omega-3 Fatty Acids (FISH OIL) 1000 MG CAPS   torsemide (DEMADEX) 20 MG tablet   traMADol (ULTRAM) 50 MG tablet   No current facility-administered medications for this encounter.  Konrad Felix Ward, PA-C [facility] [phone]

## 2022-05-06 ENCOUNTER — Ambulatory Visit (INDEPENDENT_AMBULATORY_CARE_PROVIDER_SITE_OTHER): Payer: HMO

## 2022-05-06 ENCOUNTER — Encounter: Payer: Self-pay | Admitting: Internal Medicine

## 2022-05-06 DIAGNOSIS — Z95810 Presence of automatic (implantable) cardiac defibrillator: Secondary | ICD-10-CM | POA: Diagnosis not present

## 2022-05-06 LAB — CUP PACEART REMOTE DEVICE CHECK
Battery Remaining Longevity: 50 mo
Battery Voltage: 2.98 V
Brady Statistic AP VP Percent: 0.52 %
Brady Statistic AP VS Percent: 0.01 %
Brady Statistic AS VP Percent: 99.43 %
Brady Statistic AS VS Percent: 0.04 %
Brady Statistic RA Percent Paced: 0.53 %
Brady Statistic RV Percent Paced: 99.83 %
Date Time Interrogation Session: 20230731033524
HighPow Impedance: 53 Ohm
Implantable Lead Implant Date: 20200625
Implantable Lead Implant Date: 20200625
Implantable Lead Implant Date: 20200625
Implantable Lead Location: 753858
Implantable Lead Location: 753859
Implantable Lead Location: 753860
Implantable Lead Model: 4398
Implantable Lead Model: 5076
Implantable Lead Model: 6935
Implantable Pulse Generator Implant Date: 20200625
Lead Channel Impedance Value: 178.125
Lead Channel Impedance Value: 178.125
Lead Channel Impedance Value: 194.043
Lead Channel Impedance Value: 266.667
Lead Channel Impedance Value: 266.667
Lead Channel Impedance Value: 285 Ohm
Lead Channel Impedance Value: 361 Ohm
Lead Channel Impedance Value: 361 Ohm
Lead Channel Impedance Value: 418 Ohm
Lead Channel Impedance Value: 475 Ohm
Lead Channel Impedance Value: 475 Ohm
Lead Channel Impedance Value: 608 Ohm
Lead Channel Impedance Value: 646 Ohm
Lead Channel Impedance Value: 703 Ohm
Lead Channel Impedance Value: 703 Ohm
Lead Channel Impedance Value: 836 Ohm
Lead Channel Impedance Value: 988 Ohm
Lead Channel Impedance Value: 988 Ohm
Lead Channel Pacing Threshold Amplitude: 0.625 V
Lead Channel Pacing Threshold Amplitude: 0.625 V
Lead Channel Pacing Threshold Amplitude: 2.125 V
Lead Channel Pacing Threshold Pulse Width: 0.4 ms
Lead Channel Pacing Threshold Pulse Width: 0.4 ms
Lead Channel Pacing Threshold Pulse Width: 0.8 ms
Lead Channel Sensing Intrinsic Amplitude: 0.875 mV
Lead Channel Sensing Intrinsic Amplitude: 0.875 mV
Lead Channel Sensing Intrinsic Amplitude: 15.375 mV
Lead Channel Sensing Intrinsic Amplitude: 15.375 mV
Lead Channel Setting Pacing Amplitude: 2 V
Lead Channel Setting Pacing Amplitude: 2 V
Lead Channel Setting Pacing Amplitude: 2.5 V
Lead Channel Setting Pacing Pulse Width: 0.4 ms
Lead Channel Setting Pacing Pulse Width: 0.8 ms
Lead Channel Setting Sensing Sensitivity: 0.3 mV

## 2022-05-06 NOTE — H&P (Signed)
TOTAL KNEE ADMISSION H&P  Patient is being admitted for right total knee arthroplasty.  Subjective:  Chief Complaint:right knee pain.  HPI: [name], 66 y.o. female, has a history of pain and functional disability in the right knee due to arthritis and has failed non-surgical conservative treatments for greater than 12 weeks to includeNSAID's and/or analgesics, corticosteriod injections, and activity modification.  Onset of symptoms was gradual, starting 2 years ago with gradually worsening course since that time. The patient noted no past surgery on the right knee(s).  Patient currently rates pain in the right knee(s) at 8 out of 10 with activity. Patient has worsening of pain with activity and weight bearing, pain that interferes with activities of daily living, and pain with passive range of motion.  Patient has evidence of joint space narrowing by imaging studies. There is no active infection.  Patient Active Problem List   Diagnosis Date Noted   Knee pain 04/24/2022   Localized osteoarthritis of knees, bilateral 07/25/2021   Medication monitoring encounter 03/31/2021   Osteomyelitis ([place]) 02/18/2021   CKD (chronic kidney disease) stage 4, GFR 15-29 ml/min ([place]) 01/28/2021   AICD (automatic cardioverter/defibrillator) present 01/28/2021   Diabetic foot ulcer ([place]) 11/24/2020   Advance care planning 10/04/2020   Secondary hyperparathyroidism of renal origin ([place]) 07/20/2019   Hyperkalemia 07/20/2019   Benign hypertensive kidney disease with chronic kidney disease 07/20/2019   Anemia in chronic kidney disease 07/20/2019   Positive antinuclear antibody 07/20/2019   Proteinuria 07/20/2019   Acute on chronic systolic (congestive) heart failure ([place]) 03/23/2019   UTI (urinary tract infection) 03/09/2019   CAD (coronary artery disease) 03/09/2019   DM type 2, uncontrolled, with renal complications 65/46/5035   Atrial tachycardia ([place])    Acute kidney injury superimposed on  chronic kidney disease ([place])    AKI (acute kidney injury) ([place])    Weakness generalized 12/22/2018   Shortness of breath 12/22/2018   Palliative care encounter 12/22/2018   Endocarditis 12/14/2018   NSTEMI (non-ST elevated myocardial infarction) ([place]) 11/22/2018   Acute on chronic combined systolic and diastolic CHF (congestive heart failure) ([place]) 10/28/2018   Lymphedema 10/28/2018   Severe sepsis ([place]) 09/28/2018   History of amputation of lesser toe of right foot ([place]) 04/19/2018   Presence of cardiac resynchronization therapy defibrillator (CRT-D) 04/19/2018   Paraparesis of both lower limbs ([place]) 03/29/2018   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   Paroxysmal atrial fibrillation ([place]) 03/29/2018   Class 3 obesity due to excess calories with serious comorbidity and body mass index (BMI) of 40.0 to 44.9 in adult 09/02/2016   History of CVA (cerebrovascular accident) 11/11/2015   Chronic renal insufficiency 08/04/2015   OSA (obstructive sleep apnea) 08/15/2014   Morbid obesity ([place]) 10/26/2013   Essential hypertension, benign 04/29/2013   Pure hypercholesterolemia 12/07/2012   Iron deficiency anemia 12/07/2012   Diabetic peripheral neuropathy associated with type 2 diabetes mellitus ([place]) 46/56/8127   Chronic systolic congestive heart failure ([place]) 07/29/2012   Left bundle-branch block 07/25/2012   Coronary artery disease involving native coronary artery of native heart without angina pectoris 07/25/2012   Heart failure ([place]) 07/21/2012   Hyperlipidemia 07/21/2012   Past Medical History:  Diagnosis Date   Acute myocardial infarction, subendocardial infarction, initial episode of care ([place]) 07/21/2012   Acute osteomyelitis involving ankle and foot ([place]) 51/70/0174   Acute systolic heart failure ([place]) 07/21/2012   New onset 07/19/12; admission to [facility] ED. Elevated Troponins.  S/p 2D-echo with EF 20-25%.  S/p cardiac  catheterization with stenting LAD.  Repeat 2D-echo 10/2011  with improved EF of 35%.    Anemia    Arthritis    Automatic implantable cardioverter-defibrillator in situ    a. MDT CRT-D 06/2014, SN: [id number]  .removed in feb 2020   CAD (coronary artery disease)    a. cardiac cath 101/04/2012: PCI/DES to chronically occluded mLAD, consideration PCI to diag branch in 4 weeks.    Cataract    Chicken pox    Chronic kidney disease    Chronic systolic CHF (congestive heart failure) (HCC)    a. mixed ICM & NICM; b. EF 20-25% by echo 07/2012, mid-dist 2/3 of LV sev HK/AK, mild MR. echo 10/2012: EF 30-35%, sev HK ant-septal & inf walls, GR1DD, mild MR, PASP 33. c. echo 02/2013: EF 30%, GR1DD, mild MR. echo 04/2014: EF 30%, Septal-lat dyssynchrony, global HK, inf AK, GR1DD, mild MR. d. echo 10/2014: EF50-55%, WM nl, GR1DD, septal mild paradox. e. echo 02/2015: EF 50-55%, wm    Depression    Heart attack ([place])    Heel ulcer ([place]) 04/27/2015   History of blood transfusion ~ 2011   "plasma; had neuropathy; couldn't walk"   Hypertension    Hypothyroidism    LBBB (left bundle branch block)    Neuromuscular disorder ([place])    Neuropathy 2011   Obesity, unspecified    OSA on CPAP    Moderate with AHI 23/hr and now on CPAP at 16cm H2O.  Her DME is [facility]   Pure hypercholesterolemia    Sepsis ([place]) 09/2018   Type II diabetes mellitus ([place])    Unspecified vitamin D deficiency     Past Surgical History:  Procedure Laterality Date   ABDOMINAL AORTOGRAM W/LOWER EXTREMITY N/A 02/02/2021   Procedure: ABDOMINAL AORTOGRAM W/LOWER EXTREMITY;  Surgeon: Angelia Mould, MD;  Location: [place] CV LAB;  Service: Cardiovascular;  Laterality: N/A;   AMPUTATION Left 03/02/2021   Procedure: AMPUTATION RAY, left great toe;  Surgeon: Trula Slade, DPM;  Location: [place];  Service: Podiatry;  Laterality: Left;   AMPUTATION TOE Right 06/18/2015   Procedure: AMPUTATION TOE;  Surgeon: Samara Deist, DPM;  Location: [facility] ORS;  Service: Podiatry;  Laterality: Right;   AMPUTATION  TOE Left 10/08/2018   Procedure: AMPUTATION TOE LEFT 2ND;  Surgeon: Albertine Patricia, DPM;  Location: [facility] ORS;  Service: Podiatry;  Laterality: Left;   BACK SURGERY     BI-VENTRICULAR IMPLANTABLE CARDIOVERTER DEFIBRILLATOR N/A 07/06/2014   Procedure: BI-VENTRICULAR IMPLANTABLE CARDIOVERTER DEFIBRILLATOR  (CRT-D);  Surgeon: Deboraha Sprang, MD;  Location: [facility] CATH LAB;  Service: Cardiovascular;  Laterality: N/A;   BI-VENTRICULAR IMPLANTABLE CARDIOVERTER DEFIBRILLATOR  (CRT-D)  07/06/2014   BILATERAL OOPHORECTOMY  01/2011   ovarian cyst benign   BIV ICD INSERTION CRT-D N/A 03/31/2019   Procedure: BIV ICD INSERTION CRT-D;  Surgeon: Constance Haw, MD;  Location: [place] [facility] LAB;  Service: Cardiovascular;  Laterality: N/A;   COLONOSCOPY WITH PROPOFOL Left 02/22/2015   Procedure: COLONOSCOPY WITH PROPOFOL;  Surgeon: Hulen Luster, MD;  Location: [facility] ENDOSCOPY;  Service: Endoscopy;  Laterality: Left;   CORONARY ANGIOPLASTY WITH STENT PLACEMENT Left 07/2012   new onset systolic CHF; elevated troponins.  Cardiac catheterization with stenting to LAD; EF 15%.  2D-echo: EF 20-25%.   ESOPHAGOGASTRODUODENOSCOPY N/A 02/22/2015   Procedure: ESOPHAGOGASTRODUODENOSCOPY (EGD);  Surgeon: Hulen Luster, MD;  Location: [facility] ENDOSCOPY;  Service: Endoscopy;  Laterality: N/A;   I & D EXTREMITY Left 11/28/2020   Procedure: IRRIGATION AND  DEBRIDEMENT OF FOOT;  Surgeon: Trula Slade, DPM;  Location: [place];  Service: Podiatry;  Laterality: Left;   I & D EXTREMITY Left 01/31/2021   Procedure: IRRIGATION AND DEBRIDEMENT EXTREMITY OF LEFT FOOT WITH BONE BIOPSY OF  1ST METATARSAL AND FIFTH METATARSAL;  Surgeon: Felipa Furnace, DPM;  Location: [place];  Service: Podiatry;  Laterality: Left;   I & D EXTREMITY Left 02/03/2021   Procedure: IRRIGATION AND DEBRIDEMENT EXTREMITY WITH INTERNAL AMPUTATION FIRST AND FIFTH RAY;  Surgeon: Felipa Furnace, DPM;  Location: [place];  Service: Podiatry;  Laterality: Left;   INCISION AND DRAINAGE  ABSCESS Right 2007   groin; with ICU stay due to sepsis.   IR FLUORO GUIDE CV LINE RIGHT  03/06/2021   IR REMOVAL TUN CV CATH W/O FL  04/27/2021   IR US GUIDE VASC ACCESS RIGHT  03/06/2021   IRRIGATION AND DEBRIDEMENT FOOT Left 04/25/2021   Procedure: EXCISION AND DEBRIDEMENT WOUND ULCER LEFT FOOT; METATARSECTOMY;  Surgeon: Trula Slade, DPM;  Location: [facility];  Service: Podiatry;  Laterality: Left;   LAPAROSCOPIC CHOLECYSTECTOMY  2011   LEFT HEART CATH AND CORONARY ANGIOGRAPHY N/A 10/05/2018   Procedure: LEFT HEART CATH AND CORONARY ANGIOGRAPHY;  Surgeon: Minna Merritts, MD;  Location: [place] CV LAB;  Service: Cardiovascular;  Laterality: N/A;   LEFT HEART CATHETERIZATION WITH CORONARY ANGIOGRAM N/A 07/21/2012   Procedure: LEFT HEART CATHETERIZATION WITH CORONARY ANGIOGRAM;  Surgeon: Jolaine Artist, MD;  Location: [facility] CATH LAB;  Service: Cardiovascular;  Laterality: N/A;   LUMBAR [place]   L4-5   PACEMAKER REMOVAL  11/2018   due to infection around pacemaker   [place] (PCI-S) N/A 07/23/2012   Procedure: PERCUTANEOUS CORONARY STENT INTERVENTION (PCI-S);  Surgeon: Sherren Mocha, MD;  Location: [facility] C[facility] LAB;  Service: Cardiovascular;  Laterality: N/A;   PERIPHERAL VASCULAR BALLOON ANGIOPLASTY Left 10/06/2018   Procedure: PERIPHERAL VASCULAR BALLOON ANGIOPLASTY;  Surgeon: Katha Cabal, MD;  Location: [place] CV LAB;  Service: Cardiovascular;  Laterality: Left;   PERIPHERAL VASCULAR CATHETERIZATION N/A 02/10/2015   Procedure: Picc Line Insertion;  Surgeon: Katha Cabal, MD;  Location: [place] CV LAB;  Service: Cardiovascular;  Laterality: N/A;   RIGHT HEART CATH N/A 03/29/2019   Procedure: RIGHT HEART CATH;  Surgeon: Jolaine Artist, MD;  Location: [place] CV LAB;  Service: Cardiovascular;  Laterality: N/A;   TEE WITHOUT CARDIOVERSION N/A 10/02/2018   Procedure: TRANSESOPHAGEAL ECHOCARDIOGRAM (TEE);  Surgeon:  Wellington Hampshire, MD;  Location: [facility] OR[facility];  Service: Cardiovascular;  Laterality: N/A;   [place]  01/2011   Fibroids/DUB.  Ovaries removed. [place].    No current facility-administered medications for this encounter.   Current Outpatient Medications  Medication Sig Dispense Refill Last Dose   amiodarone (PACERONE) 200 MG tablet TAKE 1/2 TABLET BY MOUTH EVERY OTHER DAY      aspirin EC 81 MG EC tablet Take 1 tablet (81 mg total) by mouth daily. Swallow whole. 30 tablet 11    atorvastatin (LIPITOR) 10 MG tablet TAKE 1 TABLET BY MOUTH ONCE DAILY 30 tablet 11    calcitRIOL (ROCALTROL) 0.5 MCG capsule Take 0.5 mcg by mouth every morning.      Calcium Carb-Cholecalciferol (CALCIUM CARBONATE-VITAMIN D3) 600-400 MG-UNIT TABS Take 1 tablet by mouth 2 (two) times a day.      carvedilol (COREG) 3.125 MG tablet TAKE 1 TABLET BY MOUTH TWICE (2) DAILY  WITH A MEAL 180 tablet 2    clopidogrel (PLAVIX) 75 MG tablet Take 1 tablet (75 mg total) by mouth daily. 30 tablet 3    docusate sodium (COLACE) 100 MG capsule Take 100 mg by mouth daily.      DULoxetine (CYMBALTA) 30 MG capsule TAKE 1 CAPSULE BY MOUTH EVERY NIGHT AT BEDTIME 90 capsule 0    gabapentin (NEURONTIN) 600 MG tablet Take 1,200 mg by mouth at bedtime.      Insulin Aspart FlexPen (NOVOLOG) 100 UNIT/ML Inject 0-10 Units into the skin 3 (three) times daily before meals. Per sliding scale      insulin glargine (LANTUS) 100 UNIT/ML injection Inject 18 Units into the skin at bedtime.      isosorbide mononitrate (IMDUR) 30 MG 24 hr tablet TAKE 1 TABLET BY MOUTH ONCE DAILY 90 tablet 3    levothyroxine (SYNTHROID) 50 MCG tablet TAKE 1 TABLET BY MOUTH ONCE DAILY BEFOREBREAKFAST 90 tablet 1    losartan (COZAAR) 25 MG tablet TAKE 1/2 TABLET BY MOUTH DAILY 45 tablet 4    Multiple Vitamin (MULTIVITAMIN WITH MINERALS) TABS tablet Take 1 tablet by mouth every morning.      nitroGLYCERIN (NITROSTAT) 0.4 MG SL tablet Place 1  tablet (0.4 mg total) under the tongue every 5 (five) minutes as needed for chest pain. 25 tablet 3    Omega-3 Fatty Acids (FISH OIL) 1000 MG CAPS Take 1,000 mg by mouth 2 (two) times a day.      traMADol (ULTRAM) 50 MG tablet TAKE 1 TO 2 TABLET BY MOUTH AT BEDTIME AS NEEDED 60 tablet 2    acetaminophen (TYLENOL) 500 MG tablet Take 1,500 mg by mouth every 6 (six) hours as needed for moderate pain or headache.      Continuous Blood Gluc Receiver (FREESTYLE LIBRE 2 READER) DEVI Use to check sugar multiple times per day as directed.  H/o DM2, treated with insulin. 1 each 0    Continuous Blood Gluc Receiver (FREESTYLE LIBRE 2 READER) DEVI FreeStyle Libre 2 Reader      Insulin Pen Needle (PEN NEEDLES) 32G X 6 MM MISC 1 each by Does not apply route 4 (four) times daily. 150 each 11    torsemide (DEMADEX) 20 MG tablet Take 1 tablet (20 mg total) by mouth 2 (two) times daily. 180 tablet 3    Allergies  Allergen Reactions   Heparin Other (See Comments)    Can not remember reaction   Nsaids Other (See Comments)    Kidney disease    Social History   Tobacco Use   Smoking status: Never   Smokeless tobacco: Never  Substance Use Topics   Alcohol use: No    Alcohol/week: 0.0 standard drinks of alcohol    Family History  Problem Relation Age of Onset   Diabetes Mother    Hypertension Mother    Arthritis Mother        knees, lumbar DDD, cervical DDD   Cancer Father        prostate,skin,lymphoma.   Cancer Brother 34       bladder cancer; non-smoker   Diabetes Maternal Grandmother    Heart disease Maternal Grandmother    COPD Maternal Grandmother    Diabetes Paternal Grandmother    Obesity Brother    Diabetes Son    Hypertension Son    Cancer Maternal Grandfather    Diabetes Paternal Grandfather      Review of Systems  Constitutional:  Negative for chills and fever.  Respiratory:  Negative for cough and shortness of breath.   Cardiovascular:  Negative for chest pain.  Gastrointestinal:   Negative for nausea and vomiting.  Musculoskeletal:  Positive for arthralgias.     Objective:  Physical Exam Well nourished and well developed. General: Alert and oriented x3, cooperative and pleasant, no acute distress. Head: normocephalic, atraumatic, neck supple. Eyes: EOMI.  Musculoskeletal: Right knee exam: No palpable effusion, warmth or erythema Generalized tenderness more so the medial and lateral Weakness but no extensor lag with 5 degree flexion contracture Flexion limited with pain to 90 degrees in the seated position No significant lower extremity erythema or calf tenderness  Calves soft and nontender. Motor function intact in LE. Strength 5/5 LE bilaterally. Neuro: Distal pulses 2+. Sensation to light touch intact in LE.  Vital signs in last 24 hours:    Labs:   Estimated body mass index is 39.81 kg/m as calculated from the following:   Height as of 04/26/22: 5' 7.5" (1.715 m).   Weight as of 04/26/22: 117 kg.   Imaging Review Plain radiographs demonstrate severe degenerative joint disease of the right knee(s). The overall alignment isneutral. The bone quality appears to be adequate for age and reported activity level.      Assessment/Plan:  End stage arthritis, right knee   The patient history, physical examination, clinical judgment of the provider and imaging studies are consistent with end stage degenerative joint disease of the right knee(s) and total knee arthroplasty is deemed medically necessary. The treatment options including medical management, injection therapy arthroscopy and arthroplasty were discussed at length. The risks and benefits of total knee arthroplasty were presented and reviewed. The risks due to aseptic loosening, infection, stiffness, patella tracking problems, thromboembolic complications and other imponderables were discussed. The patient acknowledged the explanation, agreed to proceed with the plan and consent was signed.  Patient is being admitted for inpatient treatment for surgery, pain control, PT, OT, prophylactic antibiotics, VTE prophylaxis, progressive ambulation and ADL's and discharge planning. The patient is planning to be discharged  home.  Therapy Plans: outpatient therapy at [facility] Disposition: Home with mother Planned DVT Prophylaxis: aspirin '81mg'$  BID & Plavix DME needed: none PCP: Dr. Damita Dunnings, clearance received Nephrologist: Dr. Holley Raring, clearance received (CKD) Cardiologist: Dr. Ardyth Man. Caryl Comes, clearance received (HF) TXA: IV Allergies: heparin - rash Anesthesia Concerns: none BMI: 39.8 Last HgbA1c: 6.4%   Other: - Needs to be on abx after surgery - Hgb 10 - No NSAIDs - CKD - Baseline creatinine 2.0-2.5 - Hx of osteomyelitis L foot - last year - had amputations of 2 digits - oxycodone, robaxin, tylenol   Patient's anticipated LOS is less than 2 midnights, meeting these requirements: - Younger than 53 - Lives within 1 hour of care - Has a competent adult at home to recover with post-op recover - NO history of  - Chronic pain requiring opiods  - Diabetes  - Coronary Artery Disease  - Heart failure  - Heart attack  - Stroke  - DVT/VTE  - Cardiac arrhythmia  - Respiratory Failure/COPD  - Renal failure  - Anemia  - Advanced Liver disease  Costella Hatcher, PA-C Orthopedic Surgery [facility] [place] [phone]

## 2022-05-07 ENCOUNTER — Ambulatory Visit (HOSPITAL_COMMUNITY): Payer: HMO | Admitting: Physician Assistant

## 2022-05-07 ENCOUNTER — Other Ambulatory Visit: Payer: Self-pay

## 2022-05-07 ENCOUNTER — Observation Stay (HOSPITAL_COMMUNITY)
Admission: RE | Admit: 2022-05-07 | Discharge: 2022-05-08 | Disposition: A | Discharge status: Home / Self Care | Payer: HMO | Source: Ambulatory Visit | Attending: Orthopedic Surgery | Admitting: Orthopedic Surgery

## 2022-05-07 ENCOUNTER — Encounter (HOSPITAL_COMMUNITY)
Admission: RE | Disposition: A | Discharge status: Home / Self Care | Payer: Self-pay | Source: Ambulatory Visit | Attending: Orthopedic Surgery

## 2022-05-07 ENCOUNTER — Ambulatory Visit (HOSPITAL_BASED_OUTPATIENT_CLINIC_OR_DEPARTMENT_OTHER): Payer: HMO | Admitting: Anesthesiology

## 2022-05-07 ENCOUNTER — Encounter (HOSPITAL_COMMUNITY): Payer: Self-pay | Admitting: Orthopedic Surgery

## 2022-05-07 DIAGNOSIS — Z79899 Other long term (current) drug therapy: Secondary | ICD-10-CM | POA: Insufficient documentation

## 2022-05-07 DIAGNOSIS — Z95810 Presence of automatic (implantable) cardiac defibrillator: Secondary | ICD-10-CM | POA: Diagnosis not present

## 2022-05-07 DIAGNOSIS — I5043 Acute on chronic combined systolic (congestive) and diastolic (congestive) heart failure: Secondary | ICD-10-CM | POA: Diagnosis not present

## 2022-05-07 DIAGNOSIS — I130 Hypertensive heart and chronic kidney disease with heart failure and stage 1 through stage 4 chronic kidney disease, or unspecified chronic kidney disease: Secondary | ICD-10-CM | POA: Insufficient documentation

## 2022-05-07 DIAGNOSIS — I5022 Chronic systolic (congestive) heart failure: Secondary | ICD-10-CM | POA: Diagnosis not present

## 2022-05-07 DIAGNOSIS — E1151 Type 2 diabetes mellitus with diabetic peripheral angiopathy without gangrene: Secondary | ICD-10-CM | POA: Insufficient documentation

## 2022-05-07 DIAGNOSIS — I509 Heart failure, unspecified: Secondary | ICD-10-CM | POA: Diagnosis not present

## 2022-05-07 DIAGNOSIS — E039 Hypothyroidism, unspecified: Secondary | ICD-10-CM | POA: Insufficient documentation

## 2022-05-07 DIAGNOSIS — I110 Hypertensive heart disease with heart failure: Secondary | ICD-10-CM

## 2022-05-07 DIAGNOSIS — I480 Paroxysmal atrial fibrillation: Secondary | ICD-10-CM | POA: Insufficient documentation

## 2022-05-07 DIAGNOSIS — Z96651 Presence of right artificial knee joint: Secondary | ICD-10-CM

## 2022-05-07 DIAGNOSIS — Z89412 Acquired absence of left great toe: Secondary | ICD-10-CM | POA: Diagnosis not present

## 2022-05-07 DIAGNOSIS — N184 Chronic kidney disease, stage 4 (severe): Secondary | ICD-10-CM | POA: Insufficient documentation

## 2022-05-07 DIAGNOSIS — Z89422 Acquired absence of other left toe(s): Secondary | ICD-10-CM | POA: Insufficient documentation

## 2022-05-07 DIAGNOSIS — I252 Old myocardial infarction: Secondary | ICD-10-CM

## 2022-05-07 DIAGNOSIS — M1711 Unilateral primary osteoarthritis, right knee: Secondary | ICD-10-CM | POA: Diagnosis not present

## 2022-05-07 DIAGNOSIS — E1122 Type 2 diabetes mellitus with diabetic chronic kidney disease: Secondary | ICD-10-CM | POA: Insufficient documentation

## 2022-05-07 DIAGNOSIS — Z794 Long term (current) use of insulin: Secondary | ICD-10-CM | POA: Insufficient documentation

## 2022-05-07 DIAGNOSIS — I2510 Atherosclerotic heart disease of native coronary artery without angina pectoris: Secondary | ICD-10-CM | POA: Diagnosis not present

## 2022-05-07 DIAGNOSIS — Z955 Presence of coronary angioplasty implant and graft: Secondary | ICD-10-CM | POA: Diagnosis not present

## 2022-05-07 DIAGNOSIS — Z7982 Long term (current) use of aspirin: Secondary | ICD-10-CM | POA: Diagnosis not present

## 2022-05-07 DIAGNOSIS — Z7902 Long term (current) use of antithrombotics/antiplatelets: Secondary | ICD-10-CM | POA: Insufficient documentation

## 2022-05-07 DIAGNOSIS — G8918 Other acute postprocedural pain: Secondary | ICD-10-CM | POA: Diagnosis not present

## 2022-05-07 HISTORY — PX: TOTAL KNEE ARTHROPLASTY: SHX125

## 2022-05-07 LAB — GLUCOSE, CAPILLARY
Glucose-Capillary: 107 mg/dL — ABNORMAL HIGH (ref 70–99)
Glucose-Capillary: 132 mg/dL — ABNORMAL HIGH (ref 70–99)
Glucose-Capillary: 229 mg/dL — ABNORMAL HIGH (ref 70–99)
Glucose-Capillary: 349 mg/dL — ABNORMAL HIGH (ref 70–99)

## 2022-05-07 SURGERY — ARTHROPLASTY, KNEE, TOTAL
Anesthesia: Regional | Site: Knee | Laterality: Right

## 2022-05-07 MED ORDER — SODIUM CHLORIDE 0.9 % IV SOLN: INTRAVENOUS | Status: DC

## 2022-05-07 MED ORDER — POVIDONE-IODINE 10 % EX SWAB
2.0000 | Freq: Once | CUTANEOUS | Status: AC
  Administered 2022-05-07: 2 via TOPICAL

## 2022-05-07 MED ORDER — FERROUS SULFATE 325 (65 FE) MG PO TABS
325.0000 mg | ORAL_TABLET | Freq: Three times a day (TID) | ORAL | Status: DC
  Administered 2022-05-07 – 2022-05-08 (×4): 325 mg via ORAL
  Filled 2022-05-07 (×4): qty 1

## 2022-05-07 MED ORDER — FENTANYL CITRATE PF 50 MCG/ML IJ SOSY: 25.0000 ug | PREFILLED_SYRINGE | INTRAMUSCULAR | Status: DC | PRN

## 2022-05-07 MED ORDER — BUPIVACAINE-EPINEPHRINE (PF) 0.5% -1:200000 IJ SOLN
INTRAMUSCULAR | Status: DC | PRN
  Administered 2022-05-07: 30 mL via PERINEURAL

## 2022-05-07 MED ORDER — DEXAMETHASONE SODIUM PHOSPHATE 10 MG/ML IJ SOLN: 8.0000 mg | Freq: Once | INTRAMUSCULAR | Status: DC

## 2022-05-07 MED ORDER — METHOCARBAMOL 500 MG PO TABS
500.0000 mg | ORAL_TABLET | Freq: Four times a day (QID) | ORAL | Status: DC | PRN
  Administered 2022-05-07 – 2022-05-08 (×2): 500 mg via ORAL
  Filled 2022-05-07 (×3): qty 1

## 2022-05-07 MED ORDER — BUPIVACAINE-EPINEPHRINE (PF) 0.25% -1:200000 IJ SOLN
INTRAMUSCULAR | Status: DC | PRN
  Administered 2022-05-07: 30 mL

## 2022-05-07 MED ORDER — POLYETHYLENE GLYCOL 3350 17 G PO PACK
17.0000 g | PACK | Freq: Every day | ORAL | Status: DC | PRN
Start: 2022-05-07 — End: 2022-05-08

## 2022-05-07 MED ORDER — PROPOFOL 500 MG/50ML IV EMUL
INTRAVENOUS | Status: DC | PRN
  Administered 2022-05-07: 80 ug/kg/min via INTRAVENOUS

## 2022-05-07 MED ORDER — LACTATED RINGERS IV SOLN: INTRAVENOUS | Status: DC

## 2022-05-07 MED ORDER — GABAPENTIN 400 MG PO CAPS
1200.0000 mg | ORAL_CAPSULE | Freq: Every day | ORAL | Status: DC
  Administered 2022-05-07: 1200 mg via ORAL
  Filled 2022-05-07: qty 3

## 2022-05-07 MED ORDER — DIPHENHYDRAMINE HCL 12.5 MG/5ML PO ELIX: 12.5000 mg | ORAL_SOLUTION | ORAL | Status: DC | PRN

## 2022-05-07 MED ORDER — PHENYLEPHRINE HCL-NACL 20-0.9 MG/250ML-% IV SOLN
INTRAVENOUS | Status: DC | PRN
  Administered 2022-05-07: 25 ug/min via INTRAVENOUS

## 2022-05-07 MED ORDER — OXYCODONE HCL 5 MG PO TABS
5.0000 mg | ORAL_TABLET | ORAL | Status: DC | PRN
  Administered 2022-05-07: 5 mg via ORAL
  Administered 2022-05-08: 10 mg via ORAL
  Administered 2022-05-08: 5 mg via ORAL
  Administered 2022-05-08: 10 mg via ORAL
  Filled 2022-05-07 (×2): qty 1
  Filled 2022-05-07 (×3): qty 2

## 2022-05-07 MED ORDER — CARVEDILOL 3.125 MG PO TABS
3.1250 mg | ORAL_TABLET | Freq: Two times a day (BID) | ORAL | Status: DC
  Administered 2022-05-07 – 2022-05-08 (×3): 3.125 mg via ORAL
  Filled 2022-05-07 (×3): qty 1

## 2022-05-07 MED ORDER — BUPIVACAINE-EPINEPHRINE (PF) 0.25% -1:200000 IJ SOLN
INTRAMUSCULAR | Status: AC
  Filled 2022-05-07: qty 30

## 2022-05-07 MED ORDER — KETOROLAC TROMETHAMINE 30 MG/ML IJ SOLN
INTRAMUSCULAR | Status: AC
  Filled 2022-05-07: qty 1

## 2022-05-07 MED ORDER — ONDANSETRON HCL 4 MG/2ML IJ SOLN: 4.0000 mg | Freq: Four times a day (QID) | INTRAMUSCULAR | Status: DC | PRN

## 2022-05-07 MED ORDER — MIDAZOLAM HCL 2 MG/2ML IJ SOLN
1.0000 mg | INTRAMUSCULAR | Status: DC
  Filled 2022-05-07: qty 2

## 2022-05-07 MED ORDER — PROPOFOL 500 MG/50ML IV EMUL
INTRAVENOUS | Status: AC
  Filled 2022-05-07: qty 50

## 2022-05-07 MED ORDER — METOCLOPRAMIDE HCL 5 MG/ML IJ SOLN: 5.0000 mg | Freq: Three times a day (TID) | INTRAMUSCULAR | Status: DC | PRN

## 2022-05-07 MED ORDER — CHLORHEXIDINE GLUCONATE 0.12 % MT SOLN
15.0000 mL | Freq: Once | OROMUCOSAL | Status: AC
  Administered 2022-05-07: 15 mL via OROMUCOSAL

## 2022-05-07 MED ORDER — TRANEXAMIC ACID-NACL 1000-0.7 MG/100ML-% IV SOLN
1000.0000 mg | Freq: Once | INTRAVENOUS | Status: AC
  Administered 2022-05-07: 1000 mg via INTRAVENOUS
  Filled 2022-05-07: qty 100

## 2022-05-07 MED ORDER — ACETAMINOPHEN 500 MG PO TABS
1000.0000 mg | ORAL_TABLET | Freq: Four times a day (QID) | ORAL | Status: AC
  Administered 2022-05-07 – 2022-05-08 (×4): 1000 mg via ORAL
  Filled 2022-05-07 (×4): qty 2

## 2022-05-07 MED ORDER — VANCOMYCIN HCL 1000 MG IV SOLR
INTRAVENOUS | Status: DC | PRN
  Administered 2022-05-07: 2000 mg

## 2022-05-07 MED ORDER — ATORVASTATIN CALCIUM 10 MG PO TABS
10.0000 mg | ORAL_TABLET | Freq: Every day | ORAL | Status: DC
  Administered 2022-05-07 – 2022-05-08 (×2): 10 mg via ORAL
  Filled 2022-05-07 (×2): qty 1

## 2022-05-07 MED ORDER — HYDROMORPHONE HCL 1 MG/ML IJ SOLN: 0.5000 mg | INTRAMUSCULAR | Status: DC | PRN

## 2022-05-07 MED ORDER — ASPIRIN 81 MG PO TBEC
81.0000 mg | DELAYED_RELEASE_TABLET | Freq: Every day | ORAL | Status: DC
  Administered 2022-05-08: 81 mg via ORAL
  Filled 2022-05-07: qty 1

## 2022-05-07 MED ORDER — PROPOFOL 1000 MG/100ML IV EMUL
INTRAVENOUS | Status: AC
  Filled 2022-05-07: qty 100

## 2022-05-07 MED ORDER — METOCLOPRAMIDE HCL 5 MG PO TABS: 5.0000 mg | ORAL_TABLET | Freq: Three times a day (TID) | ORAL | Status: DC | PRN

## 2022-05-07 MED ORDER — CARVEDILOL 3.125 MG PO TABS: 3.1250 mg | ORAL_TABLET | Freq: Two times a day (BID) | ORAL | Status: DC

## 2022-05-07 MED ORDER — MENTHOL 3 MG MT LOZG: 1.0000 | LOZENGE | OROMUCOSAL | Status: DC | PRN

## 2022-05-07 MED ORDER — ONDANSETRON HCL 4 MG/2ML IJ SOLN: 4.0000 mg | Freq: Once | INTRAMUSCULAR | Status: DC | PRN

## 2022-05-07 MED ORDER — BUPIVACAINE IN DEXTROSE 0.75-8.25 % IT SOLN
INTRATHECAL | Status: DC | PRN
  Administered 2022-05-07: 1.6 mL via INTRATHECAL

## 2022-05-07 MED ORDER — CEFADROXIL 500 MG PO CAPS
500.0000 mg | ORAL_CAPSULE | Freq: Two times a day (BID) | ORAL | Status: DC
  Filled 2022-05-07: qty 1

## 2022-05-07 MED ORDER — SODIUM CHLORIDE (PF) 0.9 % IJ SOLN
INTRAMUSCULAR | Status: DC | PRN
  Administered 2022-05-07: 30 mL

## 2022-05-07 MED ORDER — HYDRALAZINE HCL 20 MG/ML IJ SOLN
5.0000 mg | INTRAMUSCULAR | Status: DC | PRN
  Administered 2022-05-07: 5 mg via INTRAVENOUS

## 2022-05-07 MED ORDER — SODIUM CHLORIDE 0.9 % IR SOLN
Status: DC | PRN
  Administered 2022-05-07: 1000 mL

## 2022-05-07 MED ORDER — CLOPIDOGREL BISULFATE 75 MG PO TABS
75.0000 mg | ORAL_TABLET | Freq: Every day | ORAL | Status: DC
  Administered 2022-05-08: 75 mg via ORAL
  Filled 2022-05-07: qty 1

## 2022-05-07 MED ORDER — VANCOMYCIN HCL 1000 MG IV SOLR
INTRAVENOUS | Status: AC
  Filled 2022-05-07: qty 40

## 2022-05-07 MED ORDER — PHENYLEPHRINE HCL-NACL 20-0.9 MG/250ML-% IV SOLN
INTRAVENOUS | Status: AC
  Filled 2022-05-07: qty 250

## 2022-05-07 MED ORDER — DULOXETINE HCL 30 MG PO CPEP
30.0000 mg | ORAL_CAPSULE | Freq: Every day | ORAL | Status: DC
Start: 2022-05-07 — End: 2022-05-08
  Administered 2022-05-07: 30 mg via ORAL
  Filled 2022-05-07: qty 1

## 2022-05-07 MED ORDER — DOCUSATE SODIUM 100 MG PO CAPS
100.0000 mg | ORAL_CAPSULE | Freq: Two times a day (BID) | ORAL | Status: DC
  Administered 2022-05-07 – 2022-05-08 (×2): 100 mg via ORAL
  Filled 2022-05-07 (×2): qty 1

## 2022-05-07 MED ORDER — 0.9 % SODIUM CHLORIDE (POUR BTL) OPTIME
TOPICAL | Status: DC | PRN
  Administered 2022-05-07: 1000 mL

## 2022-05-07 MED ORDER — ORAL CARE MOUTH RINSE: 15.0000 mL | Freq: Once | OROMUCOSAL | Status: AC

## 2022-05-07 MED ORDER — HYDRALAZINE HCL 20 MG/ML IJ SOLN
INTRAMUSCULAR | Status: AC
  Filled 2022-05-07: qty 1

## 2022-05-07 MED ORDER — ONDANSETRON HCL 4 MG PO TABS: 4.0000 mg | ORAL_TABLET | Freq: Four times a day (QID) | ORAL | Status: DC | PRN

## 2022-05-07 MED ORDER — LEVOTHYROXINE SODIUM 50 MCG PO TABS
50.0000 ug | ORAL_TABLET | Freq: Every day | ORAL | Status: DC
  Administered 2022-05-08: 50 ug via ORAL
  Filled 2022-05-07: qty 1

## 2022-05-07 MED ORDER — DEXAMETHASONE SODIUM PHOSPHATE 10 MG/ML IJ SOLN
10.0000 mg | Freq: Once | INTRAMUSCULAR | Status: AC
  Administered 2022-05-08: 10 mg via INTRAVENOUS
  Filled 2022-05-07: qty 1

## 2022-05-07 MED ORDER — ISOSORBIDE MONONITRATE ER 30 MG PO TB24
30.0000 mg | ORAL_TABLET | Freq: Every day | ORAL | Status: DC
  Administered 2022-05-08: 30 mg via ORAL
  Filled 2022-05-07: qty 1

## 2022-05-07 MED ORDER — CEFAZOLIN SODIUM-DEXTROSE 2-4 GM/100ML-% IV SOLN
2.0000 g | Freq: Four times a day (QID) | INTRAVENOUS | Status: AC
  Administered 2022-05-07 – 2022-05-08 (×2): 2 g via INTRAVENOUS
  Filled 2022-05-07 (×2): qty 100

## 2022-05-07 MED ORDER — FENTANYL CITRATE PF 50 MCG/ML IJ SOSY
50.0000 ug | PREFILLED_SYRINGE | INTRAMUSCULAR | Status: AC
  Administered 2022-05-07: 50 ug via INTRAVENOUS
  Filled 2022-05-07: qty 2

## 2022-05-07 MED ORDER — AMISULPRIDE (ANTIEMETIC) 5 MG/2ML IV SOLN: 10.0000 mg | Freq: Once | INTRAVENOUS | Status: DC | PRN

## 2022-05-07 MED ORDER — SODIUM CHLORIDE (PF) 0.9 % IJ SOLN
INTRAMUSCULAR | Status: AC
  Filled 2022-05-07: qty 50

## 2022-05-07 MED ORDER — PROPOFOL 10 MG/ML IV BOLUS
INTRAVENOUS | Status: DC | PRN
  Administered 2022-05-07: 10 mg via INTRAVENOUS

## 2022-05-07 MED ORDER — AMIODARONE HCL 100 MG PO TABS
100.0000 mg | ORAL_TABLET | ORAL | Status: DC
  Administered 2022-05-08: 100 mg via ORAL
  Filled 2022-05-07: qty 1

## 2022-05-07 MED ORDER — BISACODYL 10 MG RE SUPP: 10.0000 mg | Freq: Every day | RECTAL | Status: DC | PRN

## 2022-05-07 MED ORDER — INSULIN GLARGINE-YFGN 100 UNIT/ML ~~LOC~~ SOLN
18.0000 [IU] | Freq: Every day | SUBCUTANEOUS | Status: DC
  Administered 2022-05-07: 18 [IU] via SUBCUTANEOUS
  Filled 2022-05-07 (×2): qty 0.18

## 2022-05-07 MED ORDER — CEFAZOLIN SODIUM-DEXTROSE 2-4 GM/100ML-% IV SOLN
2.0000 g | INTRAVENOUS | Status: AC
  Administered 2022-05-07: 2 g via INTRAVENOUS
  Filled 2022-05-07: qty 100

## 2022-05-07 MED ORDER — METHOCARBAMOL 1000 MG/10ML IJ SOLN: 500.0000 mg | Freq: Four times a day (QID) | INTRAVENOUS | Status: DC | PRN

## 2022-05-07 MED ORDER — NITROGLYCERIN 0.4 MG SL SUBL
0.4000 mg | SUBLINGUAL_TABLET | SUBLINGUAL | Status: DC | PRN
Start: 2022-05-07 — End: 2022-05-08

## 2022-05-07 MED ORDER — TRANEXAMIC ACID-NACL 1000-0.7 MG/100ML-% IV SOLN
1000.0000 mg | INTRAVENOUS | Status: AC
  Administered 2022-05-07: 1000 mg via INTRAVENOUS
  Filled 2022-05-07: qty 100

## 2022-05-07 MED ORDER — DEXAMETHASONE SODIUM PHOSPHATE 10 MG/ML IJ SOLN
INTRAMUSCULAR | Status: AC
  Filled 2022-05-07: qty 1

## 2022-05-07 MED ORDER — LOSARTAN POTASSIUM 25 MG PO TABS
12.5000 mg | ORAL_TABLET | Freq: Every day | ORAL | Status: DC
  Administered 2022-05-07 – 2022-05-08 (×2): 12.5 mg via ORAL
  Filled 2022-05-07 (×2): qty 1

## 2022-05-07 MED ORDER — TORSEMIDE 20 MG PO TABS
20.0000 mg | ORAL_TABLET | Freq: Two times a day (BID) | ORAL | Status: DC
  Administered 2022-05-07 – 2022-05-08 (×2): 20 mg via ORAL
  Filled 2022-05-07 (×2): qty 1

## 2022-05-07 MED ORDER — INSULIN ASPART 100 UNIT/ML IJ SOLN
0.0000 [IU] | Freq: Three times a day (TID) | INTRAMUSCULAR | Status: DC
  Administered 2022-05-07: 5 [IU] via SUBCUTANEOUS
  Administered 2022-05-08: 11 [IU] via SUBCUTANEOUS
  Administered 2022-05-08 (×2): 5 [IU] via SUBCUTANEOUS

## 2022-05-07 MED ORDER — CALCITRIOL 0.5 MCG PO CAPS
0.5000 ug | ORAL_CAPSULE | Freq: Every day | ORAL | Status: DC
Start: 2022-05-08 — End: 2022-05-08
  Administered 2022-05-08: 0.5 ug via ORAL
  Filled 2022-05-07: qty 1

## 2022-05-07 MED ORDER — ACETAMINOPHEN 10 MG/ML IV SOLN
1000.0000 mg | Freq: Once | INTRAVENOUS | Status: DC | PRN
Start: 2022-05-07 — End: 2022-05-07

## 2022-05-07 MED ORDER — ONDANSETRON HCL 4 MG/2ML IJ SOLN
INTRAMUSCULAR | Status: AC
  Filled 2022-05-07: qty 2

## 2022-05-07 MED ORDER — OXYCODONE HCL 5 MG PO TABS
10.0000 mg | ORAL_TABLET | ORAL | Status: DC | PRN
  Administered 2022-05-08: 10 mg via ORAL

## 2022-05-07 MED ORDER — PHENOL 1.4 % MT LIQD: 1.0000 | OROMUCOSAL | Status: DC | PRN

## 2022-05-07 SURGICAL SUPPLY — 61 items
ADH SKN CLS APL DERMABOND .7 (GAUZE/BANDAGES/DRESSINGS) ×1
ATTUNE MED ANAT PAT 41 KNEE (Knees) ×1 IMPLANT
ATTUNE PS FEM RT SZ 6 CEM KNEE (Femur) ×1 IMPLANT
ATTUNE PSRP INSR SZ6 10 KNEE (Insert) ×1 IMPLANT
BAG COUNTER SPONGE SURGICOUNT (BAG) IMPLANT
BAG SPEC THK2 15X12 ZIP CLS (MISCELLANEOUS)
BAG SPNG CNTER NS LX DISP (BAG)
BAG ZIPLOCK 12X15 (MISCELLANEOUS) IMPLANT
BASE TIBIAL ROT PLAT SZ 7 KNEE (Knees) IMPLANT
BLADE SAW SGTL 11.0X1.19X90.0M (BLADE) IMPLANT
BLADE SAW SGTL 13.0X1.19X90.0M (BLADE) ×3 IMPLANT
BNDG CMPR MED 15X6 ELC VLCR LF (GAUZE/BANDAGES/DRESSINGS) ×1
BNDG ELASTIC 6X15 VLCR STRL LF (GAUZE/BANDAGES/DRESSINGS) ×1 IMPLANT
BNDG ELASTIC 6X5.8 VLCR STR LF (GAUZE/BANDAGES/DRESSINGS) ×3 IMPLANT
BOWL SMART MIX CTS (DISPOSABLE) ×3 IMPLANT
BSPLAT TIB 7 CMNT ROT PLAT STR (Knees) ×1 IMPLANT
CEMENT HV SMART SET (Cement) ×2 IMPLANT
CUFF TOURN SGL QUICK 34 (TOURNIQUET CUFF)
CUFF TOURN SGL QUICK 42 (TOURNIQUET CUFF) ×1 IMPLANT
CUFF TRNQT CYL 34X4.125X (TOURNIQUET CUFF) ×2 IMPLANT
DERMABOND ADVANCED (GAUZE/BANDAGES/DRESSINGS) ×1
DERMABOND ADVANCED .7 DNX12 (GAUZE/BANDAGES/DRESSINGS) ×2 IMPLANT
DRAPE SHEET LG 3/4 BI-LAMINATE (DRAPES) ×3 IMPLANT
DRAPE U-SHAPE 47X51 STRL (DRAPES) ×3 IMPLANT
DRESSING AQUACEL AG SP 3.5X10 (GAUZE/BANDAGES/DRESSINGS) ×2 IMPLANT
DRSG AQUACEL AG SP 3.5X10 (GAUZE/BANDAGES/DRESSINGS) ×2
DURAPREP 26ML APPLICATOR (WOUND CARE) ×6 IMPLANT
ELECT REM PT RETURN 15FT ADLT (MISCELLANEOUS) ×3 IMPLANT
GLOVE BIO SURGEON STRL SZ 6 (GLOVE) ×3 IMPLANT
GLOVE BIOGEL PI IND STRL 6.5 (GLOVE) ×2 IMPLANT
GLOVE BIOGEL PI IND STRL 7.5 (GLOVE) ×2 IMPLANT
GLOVE BIOGEL PI INDICATOR 6.5 (GLOVE) ×1
GLOVE BIOGEL PI INDICATOR 7.5 (GLOVE) ×1
GLOVE ORTHO TXT STRL SZ7.5 (GLOVE) ×6 IMPLANT
GOWN STRL REUS W/ TWL LRG LVL3 (GOWN DISPOSABLE) ×4 IMPLANT
GOWN STRL REUS W/TWL LRG LVL3 (GOWN DISPOSABLE) ×4
HANDPIECE INTERPULSE COAX TIP (DISPOSABLE) ×2
HOLDER FOLEY CATH W/STRAP (MISCELLANEOUS) IMPLANT
KIT TURNOVER KIT A (KITS) IMPLANT
MANIFOLD NEPTUNE II (INSTRUMENTS) ×3 IMPLANT
NDL SAFETY ECLIPSE 18X1.5 (NEEDLE) IMPLANT
NEEDLE HYPO 18GX1.5 SHARP (NEEDLE)
NS IRRIG 1000ML POUR BTL (IV SOLUTION) ×3 IMPLANT
PACK TOTAL KNEE CUSTOM (KITS) ×3 IMPLANT
PROTECTOR NERVE ULNAR (MISCELLANEOUS) ×3 IMPLANT
SET HNDPC FAN SPRY TIP SCT (DISPOSABLE) ×2 IMPLANT
SET PAD KNEE POSITIONER (MISCELLANEOUS) ×3 IMPLANT
SPIKE FLUID TRANSFER (MISCELLANEOUS) ×6 IMPLANT
SUT MNCRL AB 4-0 PS2 18 (SUTURE) ×3 IMPLANT
SUT STRATAFIX PDS+ 0 24IN (SUTURE) ×3 IMPLANT
SUT VIC AB 1 CT1 36 (SUTURE) ×4 IMPLANT
SUT VIC AB 2-0 CT1 27 (SUTURE) ×4
SUT VIC AB 2-0 CT1 TAPERPNT 27 (SUTURE) ×4 IMPLANT
SYR 3ML LL SCALE MARK (SYRINGE) ×3 IMPLANT
TIBIAL BASE ROT PLAT SZ 7 KNEE (Knees) ×2 IMPLANT
TOWEL GREEN STERILE FF (TOWEL DISPOSABLE) ×3 IMPLANT
TRAY CATH INTERMITTENT SS 16FR (CATHETERS) ×3 IMPLANT
TRAY FOLEY MTR SLVR 16FR STAT (SET/KITS/TRAYS/PACK) ×3 IMPLANT
TUBE SUCTION HIGH CAP CLEAR NV (SUCTIONS) ×3 IMPLANT
WATER STERILE IRR 1000ML POUR (IV SOLUTION) ×6 IMPLANT
WRAP KNEE MAXI GEL POST OP (GAUZE/BANDAGES/DRESSINGS) ×3 IMPLANT

## 2022-05-07 NOTE — Care Plan (Signed)
Ortho Bundle Case Management Note  Patient Details  Name: [name] MRN: [id number] Date of Birth: Jan 14, 1956  R TKA on 05-07-22 DCP:  Home with mother DME:  No needs, has a RW PT:  [employer] on 05-13-22                   DME Arranged:  N/A DME Agency:  NA  HH Arranged:  NA [place] Agency:  NA  Additional Comments: Please contact me with any questions of if this plan should need to change.  Marianne Sofia, RN,CCM [facility]  [phone] 05/07/2022, 3:31 PM

## 2022-05-07 NOTE — Transfer of Care (Signed)
Immediate Anesthesia Transfer of Care Note  Patient: [name]  Procedure(s) Performed: TOTAL KNEE ARTHROPLASTY (Right: Knee)  Patient Location: PACU  Anesthesia Type:Spinal  Level of Consciousness: sedated  Airway & Oxygen Therapy: Patient Spontanous Breathing and Patient connected to face mask oxygen  Post-op Assessment: Report given to RN and Post -op Vital signs reviewed and stable  Post vital signs: Reviewed and stable  Last Vitals:  Vitals Value Taken Time  BP    Temp    Pulse    Resp    SpO2      Last Pain:  Vitals:   05/07/22 1213  TempSrc:   PainSc: 0-No pain      Patients Stated Pain Goal: 3 (51/83/43 7357)  Complications: No notable events documented.

## 2022-05-07 NOTE — Op Note (Signed)
NAME:  [place] RECORD NO.:  [id number]                             FACILITY:  [facility]      PHYSICIAN:  Pietro Cassis. Alvan Dame, M.D.  DATE OF BIRTH:  1956/09/11      DATE OF PROCEDURE:  05/07/2022                                     OPERATIVE REPORT         PREOPERATIVE DIAGNOSIS:  Right knee osteoarthritis.      POSTOPERATIVE DIAGNOSIS:  Right knee osteoarthritis.      FINDINGS:  The patient was noted to have complete loss of cartilage and   bone-on-bone arthritis with associated osteophytes in the medial and patellofemoral compartments of   the knee with a significant synovitis and associated effusion.  The patient had failed months of conservative treatment including medications, injection therapy, activity modification.     PROCEDURE:  Right total knee replacement.      COMPONENTS USED:  DePuy Attune rotating platform posterior stabilized knee   system, a size 6 femur, 7 tibia, size 10 mm PS AOX insert, and 38 anatomic patellar   button.      SURGEON:  Pietro Cassis. Alvan Dame, M.D.      ANESTHESIA:  Regional and Spinal.      SPECIMENS:  None.      COMPLICATION:  None.      DRAINS:  None.  EBL: 200 cc      TOURNIQUET TIME:   Total Tourniquet Time Documented: Thigh (Right) - 48 minutes Total: Thigh (Right) - 48 minutes  .      The patient was stable to the recovery room.      INDICATION FOR PROCEDURE:  [name] is a 66 y.o. female patient of   mine.  The patient had been seen, evaluated, and treated for months conservatively in the   office with medication, activity modification, and injections.  The patient had   radiographic changes of bone-on-bone arthritis with endplate sclerosis and osteophytes noted.  Based on the radiographic changes and failed conservative measures, the patient   decided to proceed with definitive treatment, total knee replacement.  Risks of infection, DVT, component failure, need for revision surgery,  neurovascular injury were reviewed in the office setting.  The postop course was reviewed stressing the efforts to maximize post-operative satisfaction and function.  Consent was obtained for benefit of pain   relief.      PROCEDURE IN DETAIL:  The patient was brought to the operative theater.   Once adequate anesthesia, preoperative antibiotics, 2 gm of Ancef,1 gm of Tranexamic Acid, and 10 mg of Decadron administered, the patient was positioned supine with a right thigh tourniquet placed.  The  right lower extremity was prepped and draped in sterile fashion.  A time-   out was performed identifying the patient, planned procedure, and the appropriate extremity.      The right lower extremity was placed in the [facility] leg holder.  The leg was   exsanguinated, tourniquet elevated to 250 mmHg.  A midline incision was   made followed by median parapatellar arthrotomy.  Following initial  exposure, attention was first directed to the patella.  Precut   measurement was noted to be 24 mm.  I resected down to 13 mm and used a   38 anatomic patellar button to restore patellar height as well as cover the cut surface.      The lug holes were drilled and a metal shim was placed to protect the   patella from retractors and saw blade during the procedure.      At this point, attention was now directed to the femur.  The femoral   canal was opened with a drill, irrigated to try to prevent fat emboli.  An   intramedullary rod was passed at 3 degrees valgus, 9 mm of bone was   resected off the distal femur.  Following this resection, the tibia was   subluxated anteriorly.  Using the extramedullary guide, 2 mm of bone was resected off   the proximal medial tibia.  We confirmed the gap would be   stable medially and laterally with a size 6 spacer block as well as confirmed that the tibial cut was perpendicular in the coronal plane, checking with an alignment rod.      Once this was done, I sized the femur to  be a size 6 in the anterior-   posterior dimension, chose a standard component based on medial and   lateral dimension.  The size 6 rotation block was then pinned in   position anterior referenced using the C-clamp to set rotation.  The   anterior, posterior, and  chamfer cuts were made without difficulty nor   notching making certain that I was along the anterior cortex to help   with flexion gap stability.      The final box cut was made off the lateral aspect of distal femur.      At this point, the tibia was sized to be a size 7.  The size 7 tray was   then pinned in position through the medial third of the tubercle,   drilled, and keel punched.  Trial reduction was now carried with a 6 femur,  7 tibia, a size 10 mm PS insert, and the 38 anatomic patella botton.  The knee was brought to full extension with good flexion stability with the patella   tracking through the trochlea without application of pressure.  Given   all these findings the trial components removed.  Final components were   opened and cement was mixed.  The knee was irrigated with normal saline solution and pulse lavage.  The synovial lining was   then injected with 30 cc of 0.25% Marcaine with epinephrine, 1 cc of Toradol and 30 cc of NS for a total of 61 cc.     Final implants were then cemented onto cleaned and dried cut surfaces of bone with the knee brought to extension with a size 10 mm PS trial insert.      Once the cement had fully cured, excess cement was removed   throughout the knee.  I confirmed that I was satisfied with the range of   motion and stability, and the final size 10 mm PS AOX insert was chosen.  It was   placed into the knee.      The tourniquet had been let down at 48 minutes.  No significant   hemostasis was required.  The extensor mechanism was then reapproximated using #1 Vicryl and #1 Stratafix sutures with the knee   in flexion.  The   remaining wound was closed with 2-0 Vicryl and  running 4-0 Monocryl.   The knee was cleaned, dried, dressed sterilely using Dermabond and   Aquacel dressing.  The patient was then   brought to recovery room in stable condition, tolerating the procedure   well.    Intraoperative findings.  Upon entering her knee I found that there was significant fibrotic reaction encompassing the entire knee.  It had the appearance of a post infectious septic arthritic appearance.  There is no purulence.  Given this finding it took extensive debridement to allow for exposure.  Additionally found that her bone was very soft.  Given these findings I did mix 1 g of vancomycin powder with her cement as well as placed 1 g of vancomycin deep in the soft tissues at the time of closure. I will likely place her on long term post antibiotics after discharge           Pietro Cassis. Alvan Dame, M.D.    05/07/2022 2:54 PM

## 2022-05-07 NOTE — Anesthesia Postprocedure Evaluation (Signed)
Anesthesia Post Note  Patient: [name]  Procedure(s) Performed: TOTAL KNEE ARTHROPLASTY (Right: Knee)     Patient location during evaluation: PACU Anesthesia Type: Regional and Spinal Level of consciousness: awake Pain management: pain level controlled Vital Signs Assessment: post-procedure vital signs reviewed and stable Respiratory status: spontaneous breathing, nonlabored ventilation, respiratory function stable and patient connected to nasal cannula oxygen Cardiovascular status: stable and blood pressure returned to baseline Postop Assessment: no apparent nausea or vomiting Anesthetic complications: no   No notable events documented.  Last Vitals:  Vitals:   05/07/22 1748 05/07/22 1949  BP: (!) 166/73   Pulse: 65 68  Resp: 18 17  Temp: 36.4 C   SpO2: 100% 99%    Last Pain:  Vitals:   05/07/22 1800  TempSrc:   PainSc: 0-No pain                 Emberlynn Riggan P Lindsie Simar

## 2022-05-07 NOTE — Plan of Care (Signed)
  Problem: Education: Goal: Ability to describe self-care measures that may prevent or decrease complications (Diabetes Survival Skills Education) will improve Outcome: Progressing   Problem: Education: Goal: Knowledge of General Education information will improve Description: Including pain rating scale, medication(s)/side effects and non-pharmacologic comfort measures Outcome: Progressing   Problem: Clinical Measurements: Goal: Ability to maintain clinical measurements within normal limits will improve Outcome: Progressing   Problem: Activity: Goal: Risk for activity intolerance will decrease Outcome: Progressing   Problem: Elimination: Goal: Will not experience complications related to bowel motility Outcome: Progressing   Problem: Education: Goal: Knowledge of the prescribed therapeutic regimen will improve Outcome: Progressing   Problem: Activity: Goal: Ability to avoid complications of mobility impairment will improve Outcome: Progressing   Problem: Pain Management: Goal: Pain level will decrease with appropriate interventions Outcome: Progressing   Problem: Skin Integrity: Goal: Will show signs of wound healing Outcome: Progressing

## 2022-05-07 NOTE — Discharge Instructions (Signed)

## 2022-05-07 NOTE — Anesthesia Procedure Notes (Signed)
Spinal  Patient location during procedure: OR Start time: 05/07/2022 12:54 PM End time: 05/07/2022 12:56 PM Reason for block: surgical anesthesia Staffing Performed: resident/CRNA  Anesthesiologist: Murvin Natal, MD Resident/CRNA: Lind Covert, CRNA Performed by: Lind Covert, CRNA Authorized by: Lind Covert, CRNA   Preanesthetic Checklist Completed: patient identified, IV checked, site marked, risks and benefits discussed, surgical consent, monitors and equipment checked, pre-op evaluation and timeout performed Spinal Block Patient position: sitting Prep: DuraPrep Patient monitoring: heart rate, cardiac monitor, continuous pulse ox and blood pressure Approach: left paramedian Location: L3-4 Injection technique: single-shot Needle Needle type: Pencan  Needle gauge: 24 G Needle length: 10 cm Needle insertion depth: 7 cm Assessment Sensory level: T6 Events: CSF return Additional Notes Timeout performed. Patient in sitting position. Site identified and cleansed with Duraprep. SAB without difficulty. To supine position.

## 2022-05-07 NOTE — Anesthesia Procedure Notes (Signed)
Anesthesia Regional Block: Adductor canal block   Pre-Anesthetic Checklist: , timeout performed,  Correct Patient, Correct Site, Correct Laterality,  Correct Procedure,, site marked,  Risks and benefits discussed,  Surgical consent,  Pre-op evaluation,  At surgeon's request and post-op pain management  Laterality: Right  Prep: chloraprep       Needles:  Injection technique: Single-shot  Needle Type: Echogenic Stimulator Needle     Needle Length: 10cm  Needle Gauge: 20     Additional Needles:   Procedures:,,,, ultrasound used (permanent image in chart),,    Narrative:  Start time: 05/07/2022 12:05 PM End time: 05/07/2022 12:15 PM Injection made incrementally with aspirations every 5 mL.  Performed by: Personally  Anesthesiologist: Murvin Natal, MD  Additional Notes: Functioning IV was confirmed and monitors were applied. A time-out was performed. Hand hygiene and sterile gloves were used. The thigh was placed in a frog-leg position and prepped in a sterile fashion. A 171m 20ga Bbraun echogenic stimulator needle was placed using ultrasound guidance.  Negative aspiration and negative test dose prior to incremental administration of local anesthetic. The patient tolerated the procedure well.

## 2022-05-07 NOTE — Interval H&P Note (Signed)
History and Physical Interval Note:  05/07/2022 11:39 AM  [name]  has presented today for surgery, with the diagnosis of Right knee osteoarthritis.  The various methods of treatment have been discussed with the patient and family. After consideration of risks, benefits and other options for treatment, the patient has consented to  Procedure(s): TOTAL KNEE ARTHROPLASTY (Right) as a surgical intervention.  The patient's history has been reviewed, patient examined, no change in status, stable for surgery.  I have reviewed the patient's chart and labs.  Questions were answered to the patient's satisfaction.     Mauri Pole

## 2022-05-08 ENCOUNTER — Encounter (HOSPITAL_COMMUNITY): Payer: Self-pay | Admitting: Orthopedic Surgery

## 2022-05-08 DIAGNOSIS — M1711 Unilateral primary osteoarthritis, right knee: Secondary | ICD-10-CM | POA: Diagnosis not present

## 2022-05-08 LAB — BASIC METABOLIC PANEL
Anion gap: 6 (ref 5–15)
BUN: 41 mg/dL — ABNORMAL HIGH (ref 8–23)
CO2: 27 mmol/L (ref 22–32)
Calcium: 8.8 mg/dL — ABNORMAL LOW (ref 8.9–10.3)
Chloride: 104 mmol/L (ref 98–111)
Creatinine, Ser: 2.08 mg/dL — ABNORMAL HIGH (ref 0.44–1.00)
GFR, Estimated: 25 mL/min — ABNORMAL LOW (ref >60)
Glucose, Bld: 389 mg/dL — ABNORMAL HIGH (ref 70–99)
Potassium: 5.1 mmol/L (ref 3.5–5.1)
Sodium: 137 mmol/L (ref 135–145)

## 2022-05-08 LAB — CBC
HCT: 29.5 % — ABNORMAL LOW (ref 36.0–46.0)
Hemoglobin: 9.4 g/dL — ABNORMAL LOW (ref 12.0–15.0)
MCH: 29.7 pg (ref 26.0–34.0)
MCHC: 31.9 g/dL (ref 30.0–36.0)
MCV: 93.4 fL (ref 80.0–100.0)
Platelets: 174 10*3/uL (ref 150–400)
RBC: 3.16 MIL/uL — ABNORMAL LOW (ref 3.87–5.11)
RDW: 13.8 % (ref 11.5–15.5)
WBC: 7.6 10*3/uL (ref 4.0–10.5)
nRBC: 0 % (ref 0.0–0.2)

## 2022-05-08 LAB — GLUCOSE, CAPILLARY
Glucose-Capillary: 247 mg/dL — ABNORMAL HIGH (ref 70–99)
Glucose-Capillary: 230 mg/dL — ABNORMAL HIGH (ref 70–99)
Glucose-Capillary: 331 mg/dL — ABNORMAL HIGH (ref 70–99)

## 2022-05-08 MED ORDER — METHOCARBAMOL 500 MG PO TABS: 500.0000 mg | ORAL_TABLET | Freq: Four times a day (QID) | ORAL | 0 refills | Status: DC | PRN

## 2022-05-08 MED ORDER — CEFADROXIL 500 MG PO CAPS: 500.0000 mg | ORAL_CAPSULE | Freq: Two times a day (BID) | ORAL | 0 refills | Status: AC

## 2022-05-08 MED ORDER — ACETAMINOPHEN 500 MG PO TABS: 1000.0000 mg | ORAL_TABLET | Freq: Four times a day (QID) | ORAL | 0 refills | Status: AC | PRN

## 2022-05-08 MED ORDER — CEFADROXIL 500 MG PO CAPS
500.0000 mg | ORAL_CAPSULE | Freq: Two times a day (BID) | ORAL | Status: DC
  Administered 2022-05-08: 500 mg via ORAL
  Filled 2022-05-08 (×2): qty 1

## 2022-05-08 MED ORDER — OXYCODONE HCL 5 MG PO TABS: 5.0000 mg | ORAL_TABLET | ORAL | 0 refills | Status: DC | PRN

## 2022-05-08 MED ORDER — POLYETHYLENE GLYCOL 3350 17 G PO PACK
17.0000 g | PACK | Freq: Every day | ORAL | 0 refills | Status: AC | PRN
Start: 2022-05-08 — End: ?

## 2022-05-08 NOTE — Progress Notes (Signed)
05/08/22 1400  PT Visit Information  Last PT Received On 05/08/22  Assistance Needed +1  Pt progressing well. Reviewed stairs and pt with good stability, no knee buckling.  Pt concerned about knee "popping". Slight popping initial ~3 reps of knee flexion this am, pt reports knee "popping" with amb to bathroom with nursing staff.  Repeated knee flexion after amb/stairs, no popping noted. Pt denies pain, R knee does not appear or feel unstable per pt report. RN made aware. Pt reports BS to be 369, RN notified as well.  History of Present Illness 66 yo female s/p RTKA on 05/07/22/ PMH: CAD, osteomyelitis, back surgery, obesity, toe amputations, HTN, neuropathy, DM, pacemaker and removal  Subjective Data  Patient Stated Goal home soon, less knee pain  Precautions  Precautions Fall;Knee  Restrictions  Weight Bearing Restrictions No  Other Position/Activity Restrictions WBAT  Pain Assessment  Pain Assessment 0-10  Pain Score 2  Pain Location right knee  Pain Descriptors / Indicators Aching;Grimacing;Sore  Pain Intervention(s) Limited activity within patient's tolerance;Monitored during session;Repositioned  Cognition  Arousal/Alertness Awake/alert  Behavior During Therapy WFL for tasks assessed/performed  Overall Cognitive Status Within Functional Limits for tasks assessed  Transfers  Overall transfer level Needs assistance  Equipment used Rolling walker (2 wheels)  Transfers Sit to/from Stand  Sit to Stand Supervision;Min guard  General transfer comment cues for hand placement  Ambulation/Gait  Ambulation/Gait assistance Min guard;Supervision  Gait Distance (Feet) 50 Feet  Assistive device Rolling walker (2 wheels)  Gait Pattern/deviations Step-to pattern;Decreased stance time - right  General Gait Details cues for sequence and RW position  Stairs Yes  Stairs assistance Min guard  Stair Management One rail Left;Step to pattern;Sideways  Number of Stairs 5 (x2)  General stair  comments cues for sequence and technique. good stability no knee buckling  PT - End of Session  Equipment Utilized During Treatment Gait belt  Activity Tolerance Patient tolerated treatment well  Patient left in chair;with call bell/phone within reach  Nurse Communication Mobility status   PT - Assessment/Plan  PT Plan Current plan remains appropriate  PT Visit Diagnosis Difficulty in walking, not elsewhere classified (R26.2);Other abnormalities of gait and mobility (R26.89)  PT Frequency (ACUTE ONLY) 7X/week  Follow Up Recommendations Follow physician's recommendations for discharge plan and follow up therapies  Assistance recommended at discharge PRN  Patient can return home with the following Assist for transportation;Help with stairs or ramp for entrance;Assistance with cooking/housework  PT equipment None recommended by PT  AM-PAC PT "6 Clicks" Mobility Outcome Measure (Version 2)  Help needed turning from your back to your side while in a flat bed without using bedrails? 3  Help needed moving from lying on your back to sitting on the side of a flat bed without using bedrails? 3  Help needed moving to and from a bed to a chair (including a wheelchair)? 3  Help needed standing up from a chair using your arms (e.g., wheelchair or bedside chair)? 3  Help needed to walk in hospital room? 3  Help needed climbing 3-5 steps with a railing?  3  6 Click Score 18  Consider Recommendation of Discharge To: Home with [facility]  Progressive Mobility  What is the highest level of mobility based on the progressive mobility assessment? Level 5 (Walks with assist in room/hall) - Balance while stepping forward/back and can walk in room with assist - Complete  Activity Ambulated with assistance in hallway  PT Goal Progression  Progress towards PT  goals Progressing toward goals  Acute Rehab PT Goals  PT Goal Formulation With patient  Time For Goal Achievement 05/15/22  Potential to Achieve Goals Good  PT  Time Calculation  PT Start Time (ACUTE ONLY) 1440  PT Stop Time (ACUTE ONLY) 1455  PT Time Calculation (min) (ACUTE ONLY) 15 min  PT General Charges  $$ ACUTE PT VISIT 1 Visit  PT Treatments  $Gait Training 8-22 mins

## 2022-05-08 NOTE — Progress Notes (Signed)
Patient discharged to home w/ family. Given all belongings, instructions. Verbalized understanding of all instructions. Escorted to pov via w/c. 

## 2022-05-08 NOTE — Evaluation (Signed)
Physical Therapy Evaluation Patient Details Name: [name] MRN: [id number] DOB: May 08, 1956 Today's Date: 05/08/2022  History of Present Illness  66 yo female s/p RTKA on 05/07/22/ PMH: CAD, osteomyelitis, back surgery, obesity, toe amputations, HTN, neuropathy, DM, pacemaker and removal  Clinical Impression  Pt is s/p TKA resulting in the deficits listed below (see PT Problem List).  PT is doing well, very pleasant and motivated. Will see again this afternoon and pt should be ready to d/c later today  Pt will benefit from skilled PT to increase their independence and safety with mobility to allow discharge to the venue listed below.         Recommendations for follow up therapy are one component of a multi-disciplinary discharge planning process, led by the attending physician.  Recommendations may be updated based on patient status, additional functional criteria and insurance authorization.  Follow Up Recommendations Follow physician's recommendations for discharge plan and follow up therapies      Assistance Recommended at Discharge PRN  Patient can return home with the following  Assist for transportation;Help with stairs or ramp for entrance;Assistance with cooking/housework    Equipment Recommendations None recommended by PT  Recommendations for Other Services       Functional Status Assessment Patient has had a recent decline in their functional status and demonstrates the ability to make significant improvements in function in a reasonable and predictable amount of time.     Precautions / Restrictions Precautions Precautions: Fall;Knee Restrictions Weight Bearing Restrictions: No Other Position/Activity Restrictions: WBAT      Mobility  Bed Mobility               General bed mobility comments: pt in recliner    Transfers Overall transfer level: Needs assistance Equipment used: Rolling walker (2 wheels) Transfers: Sit to/from Stand Sit to Stand:  Supervision, Min guard           General transfer comment: cues for hand placement    Ambulation/Gait Ambulation/Gait assistance: Min guard, Supervision Gait Distance (Feet): 100 Feet Assistive device: Rolling walker (2 wheels) Gait Pattern/deviations: Step-to pattern, Decreased stance time - right       General Gait Details: cues for sequence and RW position  Stairs            Wheelchair Mobility    Modified Rankin (Stroke Patients Only)       Balance                                             Pertinent Vitals/Pain Pain Assessment Pain Assessment: 0-10 Pain Location: right knee Pain Descriptors / Indicators: Aching, Grimacing, Sore Pain Intervention(s): Limited activity within patient's tolerance, Monitored during session, Premedicated before session, Repositioned, Ice applied    Home Living Family/patient expects to be discharged to:: Private residence Living Arrangements: Parent Available Help at Discharge: Family Type of Home: House Home Access: Stairs to enter Entrance Stairs-Rails: Psychiatric nurse of Steps: 4     Home Equipment: Conservation officer, nature (2 wheels);BSC/3in1;Shower seat;Wheelchair - manual Additional Comments: mother and dtr assisting after surgery    Prior Function Prior Level of Function : Independent/Modified Independent                     Hand Dominance        Extremity/Trunk Assessment   Upper Extremity Assessment Upper Extremity Assessment: Overall [facility]  for tasks assessed    Lower Extremity Assessment Lower Extremity Assessment: RLE deficits/detail (drop foot on L) RLE Deficits / Details: ankle WFL. knee and hip strength 2+/5.  knee flexion ~ 5 to 65 degrees       Communication      Cognition Arousal/Alertness: Awake/alert Behavior During Therapy: WFL for tasks assessed/performed Overall Cognitive Status: Within Functional Limits for tasks assessed                                  General Comments: very pleasant and motivated        General Comments      Exercises Total Joint Exercises Ankle Circles/Pumps: AROM, Right, 10 reps Quad Sets: 10 reps, Both, AROM Heel Slides: AAROM, Right, 10 reps Hip ABduction/ADduction: AROM, AAROM, Right, 10 reps Straight Leg Raises: AROM, AAROM, 5 reps, Right   Assessment/Plan    PT Assessment Patient needs continued PT services  PT Problem List Decreased strength;Decreased mobility;Decreased range of motion;Decreased activity tolerance;Decreased balance;Pain;Decreased knowledge of use of DME       PT Treatment Interventions DME instruction;Therapeutic exercise;Gait training;Functional mobility training;Therapeutic activities;Patient/family education;Stair training    PT Goals (Current goals can be found in the Care Plan section)  Acute Rehab PT Goals Patient Stated Goal: home soon, less knee pain PT Goal Formulation: With patient Time For Goal Achievement: 05/15/22 Potential to Achieve Goals: Good    Frequency 7X/week     Co-evaluation               AM-PAC PT "6 Clicks" Mobility  Outcome Measure Help needed turning from your back to your side while in a flat bed without using bedrails?: A Little Help needed moving from lying on your back to sitting on the side of a flat bed without using bedrails?: A Little Help needed moving to and from a bed to a chair (including a wheelchair)?: A Little Help needed standing up from a chair using your arms (e.g., wheelchair or bedside chair)?: A Little Help needed to walk in hospital room?: A Little Help needed climbing 3-5 steps with a railing? : A Little 6 Click Score: 18    End of Session Equipment Utilized During Treatment: Gait belt Activity Tolerance: Patient tolerated treatment well Patient left: in chair;with call bell/phone within reach Nurse Communication: Mobility status PT Visit Diagnosis: Difficulty in walking, not elsewhere  classified (R26.2);Other abnormalities of gait and mobility (R26.89)    Time: 2202-5427 PT Time Calculation (min) (ACUTE ONLY): 23 min   Charges:   PT Evaluation $PT Eval Low Complexity: 1 Low PT Treatments $Gait Training: 8-22 mins        Baxter Flattery, PT  Acute Rehab Dept [facility]) [phone]  [facility] Weekend Pager [facility] only)  [phone]  05/08/2022   [facility] 05/08/2022, 11:27 AM

## 2022-05-08 NOTE — TOC Transition Note (Signed)
Transition of Care [facility]) - CM/SW Discharge Note  Patient Details  Name: [name] MRN: [id number] Date of Birth: 08-20-1956  Transition of Care [facility]) CM/SW Contact:  Sherie Don, LCSW Phone Number: 05/08/2022, 10:57 AM  Clinical Narrative: Patient is expected to discharge home after working with PT. CSW met with patient to confirm discharge plan. Patient will go home with OPPT at [facility] in [place]. Patient has a rolling walker at home, so there are no DME needs at this time. TOC signing off.  Final next level of care: OP Rehab Barriers to Discharge: No Barriers Identified  Patient Goals and CMS Choice Patient states their goals for this hospitalization and ongoing recovery are:: Discharge home with OPPT at [facility] Choice offered to / list presented to : NA  Discharge Plan and Services        DME Arranged: N/A DME Agency: NA HH Arranged: NA [place] Agency: NA  Readmission Risk Interventions    02/06/2021   12:25 PM  Readmission Risk Prevention Plan  Transportation Screening Complete  Medication Review ([place]) Complete  PCP or Specialist appointment within 3-5 days of discharge Complete  HRI or [place] Complete  SW Recovery Care/Counseling Consult Complete  [place] Not Applicable

## 2022-05-08 NOTE — Progress Notes (Signed)
Patient ID: [name], female   DOB: September 14, 1956, 66 y.o.   MRN: [id number] Subjective: 1 Day Post-Op Procedure(s) (LRB): TOTAL KNEE ARTHROPLASTY (Right)    Patient reports pain as mild to moderate Already has stood by bed this am No events noted or reported  Objective:   VITALS:   Vitals:   05/08/22 0230 05/08/22 0612  BP: (!) 122/50 (!) 112/93  Pulse: 65 62  Resp: 18 18  Temp: 98.5 F (36.9 C) 98.6 F (37 C)  SpO2: 98% 98%    Neurovascular intact Incision: dressing C/D/I  LABS Recent Labs    05/08/22 0240  HGB 9.4*  HCT 29.5*  WBC 7.6  PLT 174    Recent Labs    05/08/22 0240  NA 137  K 5.1  BUN 41*  CREATININE 2.08*  GLUCOSE 389*    No results for input(s): "LABPT", "INR" in the last 72 hours.   Assessment/Plan: 1 Day Post-Op Procedure(s) (LRB): TOTAL KNEE ARTHROPLASTY (Right)   Advance diet Up with therapy  Reviewed intraoperative findings I am going to place her on at least 2 weeks of prophylactic antibiotics Reviewed concerns of her bone quality Reviewed recommendations for high frequency of movement of her knee throughout the days RTC in 2 weeks

## 2022-05-08 NOTE — Progress Notes (Signed)
Patient dangled and stood at side of bed. Tolerated well

## 2022-05-09 ENCOUNTER — Ambulatory Visit: Payer: HMO | Admitting: Oncology

## 2022-05-10 ENCOUNTER — Telehealth: Payer: Self-pay

## 2022-05-10 NOTE — Progress Notes (Signed)
Chronic Care Management Pharmacy Assistant   Name: [name]  MRN: [id number] DOB: 10/10/1955  Reason for Encounter: CCM (Hosptial Follow Up)  Medications: Outpatient Encounter Medications as of 05/10/2022  Medication Sig Note   acetaminophen (TYLENOL) 500 MG tablet Take 2 tablets (1,000 mg total) by mouth every 6 (six) hours as needed for moderate pain or headache.    amiodarone (PACERONE) 200 MG tablet TAKE 1/2 TABLET BY MOUTH EVERY OTHER DAY    aspirin EC 81 MG EC tablet Take 1 tablet (81 mg total) by mouth daily. Swallow whole.    atorvastatin (LIPITOR) 10 MG tablet TAKE 1 TABLET BY MOUTH ONCE DAILY    calcitRIOL (ROCALTROL) 0.5 MCG capsule Take 0.5 mcg by mouth every morning.    Calcium Carb-Cholecalciferol (CALCIUM CARBONATE-VITAMIN D3) 600-400 MG-UNIT TABS Take 1 tablet by mouth 2 (two) times a day.    carvedilol (COREG) 3.125 MG tablet TAKE 1 TABLET BY MOUTH TWICE (2) DAILY WITH A MEAL    cefadroxil (DURICEF) 500 MG capsule Take 1 capsule (500 mg total) by mouth 2 (two) times daily for 14 days.    clopidogrel (PLAVIX) 75 MG tablet Take 1 tablet (75 mg total) by mouth daily.    Continuous Blood Gluc Receiver (FREESTYLE LIBRE 2 READER) DEVI Use to check sugar multiple times per day as directed.  H/o DM2, treated with insulin.    Continuous Blood Gluc Receiver (FREESTYLE LIBRE 2 READER) DEVI FreeStyle [place] 2 Reader    docusate sodium (COLACE) 100 MG capsule Take 100 mg by mouth daily.    DULoxetine (CYMBALTA) 30 MG capsule TAKE 1 CAPSULE BY MOUTH EVERY NIGHT AT BEDTIME    gabapentin (NEURONTIN) 600 MG tablet Take 1,200 mg by mouth at bedtime.    Insulin Aspart FlexPen (NOVOLOG) 100 UNIT/ML Inject 0-10 Units into the skin 3 (three) times daily before meals. Per sliding scale 05/07/2022: 4 units sq    insulin glargine (LANTUS) 100 UNIT/ML injection Inject 18 Units into the skin at bedtime. 05/07/2022: 8 units sq   Insulin Pen Needle (PEN NEEDLES) 32G X 6 MM MISC 1 each by Does not  apply route 4 (four) times daily.    isosorbide mononitrate (IMDUR) 30 MG 24 hr tablet TAKE 1 TABLET BY MOUTH ONCE DAILY    levothyroxine (SYNTHROID) 50 MCG tablet TAKE 1 TABLET BY MOUTH ONCE DAILY BEFOREBREAKFAST    losartan (COZAAR) 25 MG tablet TAKE 1/2 TABLET BY MOUTH DAILY    methocarbamol (ROBAXIN) 500 MG tablet Take 1 tablet (500 mg total) by mouth every 6 (six) hours as needed for muscle spasms.    Multiple Vitamin (MULTIVITAMIN WITH MINERALS) TABS tablet Take 1 tablet by mouth every morning.    nitroGLYCERIN (NITROSTAT) 0.4 MG SL tablet Place 1 tablet (0.4 mg total) under the tongue every 5 (five) minutes as needed for chest pain.    Omega-3 Fatty Acids (FISH OIL) 1000 MG CAPS Take 1,000 mg by mouth 2 (two) times a day.    oxyCODONE (OXY IR/ROXICODONE) 5 MG immediate release tablet Take 1 tablet (5 mg total) by mouth every 4 (four) hours as needed for severe pain.    polyethylene glycol (MIRALAX / GLYCOLAX) 17 g packet Take 17 g by mouth daily as needed for mild constipation.    torsemide (DEMADEX) 20 MG tablet Take 1 tablet (20 mg total) by mouth 2 (two) times daily.    No facility-administered encounter medications on file as of 05/10/2022.     Reviewed hospital  notes for details of recent visit. Patient has been contacted by Transitions of Care team: No  Admitted to the hospital on 05/07/2022. Discharge date was 05/08/2022.  Discharged from [facility].   Discharge diagnosis (Principal Problem):  Right total knee arthroplasty Patient was discharged to Home  Brief summary of hospital course:  Reviewed intraoperative findings Intraoperative findings.  (Upon entering her knee I found that there was significant fibrotic reaction encompassing the entire knee.  It had the appearance of a post infectious septic arthritic appearance.  There is no purulence.  Given this finding it took extensive debridement to allow for exposure.  Additionally found that her bone was very soft.  Given these findings I did mix 1 g of vancomycin powder with her cement as well as placed 1 g of vancomycin deep in the soft tissues at the time of closure) I am going to place her on at least 2 weeks of prophylactic antibiotics Reviewed concerns of her bone quality Reviewed recommendations for high frequency of movement of her knee throughout the days RTC in 2 weeks   New Medications Started at Hospital Discharge:?? -Started cefadroxil (DURICEF) 500 MG capsule -Started methocarbamol (ROBAXIN) 500 MG tablet -Started oxyCODONE (OXY IR/ROXICODONE) 5 MG immediate release tablet -Started polyethylene glycol (MIRALAX / GLYCOLAX) 17 g packet  Medication Changes at Hospital Discharge: -Changed acetaminophen (TYLENOL) 500 MG tablet  Medications Discontinued at Hospital Discharge: -Stopped traMADol 50 MG tablet (ULTRAM)  Medications that remain the same after Hospital Discharge:??  -All other medications will remain the same.    Next CCM appt: 05/30/2022  Other upcoming appts: No appointments scheduled within the next 30 days.  Charlene Brooke, PharmD notified and will determine if action is needed.  Charlene Brooke, CPP notified  Marijean Niemann, [place] Clinical Pharmacy Assistant [phone]

## 2022-05-13 DIAGNOSIS — Z96651 Presence of right artificial knee joint: Secondary | ICD-10-CM | POA: Diagnosis not present

## 2022-05-15 DIAGNOSIS — Z96651 Presence of right artificial knee joint: Secondary | ICD-10-CM | POA: Diagnosis not present

## 2022-05-20 ENCOUNTER — Encounter: Payer: Self-pay | Admitting: Neurology

## 2022-05-20 ENCOUNTER — Telehealth (INDEPENDENT_AMBULATORY_CARE_PROVIDER_SITE_OTHER): Payer: HMO | Admitting: Neurology

## 2022-05-20 VITALS — Ht 67.5 in | Wt 258.0 lb

## 2022-05-20 DIAGNOSIS — E1142 Type 2 diabetes mellitus with diabetic polyneuropathy: Secondary | ICD-10-CM | POA: Diagnosis not present

## 2022-05-20 DIAGNOSIS — G610 Guillain-Barre syndrome: Secondary | ICD-10-CM | POA: Diagnosis not present

## 2022-05-20 DIAGNOSIS — Z96651 Presence of right artificial knee joint: Secondary | ICD-10-CM | POA: Diagnosis not present

## 2022-05-20 MED ORDER — DULOXETINE HCL 30 MG PO CPEP: 30.0000 mg | ORAL_CAPSULE | Freq: Every day | ORAL | 3 refills | Status: DC

## 2022-05-20 NOTE — Progress Notes (Signed)
   Virtual Visit via Video Note The purpose of this virtual visit is to provide medical care while limiting exposure to the novel coronavirus.    Consent was obtained for video visit:  Yes.   Answered questions that patient had about telehealth interaction:  Yes.   I discussed the limitations, risks, security and privacy concerns of performing an evaluation and management service by telemedicine. I also discussed with the patient that there may be a patient responsible charge related to this service. The patient expressed understanding and agreed to proceed.  Pt location: Home Physician Location: office Name of referring provider:  [name], MD I connected with [name] at patients initiation/request on 05/20/2022 at  2:30 PM EDT by video enabled telemedicine application and verified that I am speaking with the correct person using two identifiers. Pt MRN:  [id number] Pt DOB:  09-19-1956 Video Participants:  [name]   History of Present Illness: This is a 66 y.o. female returning for follow-up of polyradiculoneuropathy with residual left foot drop and distal paresthesias. Patient was unable to connect video, so visit was switched to audio only.   She has been doing well since her last visit in October 2022.  Due to CKD, her gabapentin was reduced to '300mg'$  qhs, which she is tolerating.  She has not noticed significant increase in pain.  She also takes cymbalta '30mg'$  daily.  No new neurological complaints. Diabetes remains well-controlled with HbA1c 6.4 She had right knee TKA earlier in August and starting PT for this.    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  HbA1c well controlled 6. Clinically stable.  - Continue gabapentin '300mg'$  at bedtime (renally dosed) and cymbalta '30mg'$  daily   Follow Up Instructions:   I discussed the  assessment and treatment plan with the patient. The patient was provided an opportunity to ask questions and all were answered. The patient agreed with the plan and demonstrated an understanding of the instructions.   The patient was advised to call back or seek an in-person evaluation if the symptoms worsen or if the condition fails to improve as anticipated.  Follow-up in 6 months  Total time spent:  8 minutes     Alda Berthold, DO

## 2022-05-20 NOTE — Discharge Summary (Signed)
Patient ID: [name] MRN: [id number] DOB/AGE: 1956-03-19 66 y.o.  Admit date: 05/07/2022 Discharge date: 05/08/2022  Admission Diagnoses:  Right knee osteoarthritis  Discharge Diagnoses:  Principal Problem:   S/P total knee arthroplasty, right   Past Medical History:  Diagnosis Date   Acute myocardial infarction, subendocardial infarction, initial episode of care ([place]) 07/21/2012   Acute osteomyelitis involving ankle and foot ([place]) 53/61/4431   Acute systolic heart failure ([place]) 07/21/2012   New onset 07/19/12; admission to [facility] ED. Elevated Troponins.  S/p 2D-echo with EF 20-25%.  S/p cardiac catheterization with stenting LAD.  Repeat 2D-echo 10/2011 with improved EF of 35%.    Anemia    Arthritis    Automatic implantable cardioverter-defibrillator in situ    a. MDT CRT-D 06/2014, SN: [id number]  .removed in feb 2020   CAD (coronary artery disease)    a. cardiac cath 101/04/2012: PCI/DES to chronically occluded mLAD, consideration PCI to diag branch in 4 weeks.    Cataract    Chicken pox    Chronic kidney disease    Chronic systolic CHF (congestive heart failure) (HCC)    a. mixed ICM & NICM; b. EF 20-25% by echo 07/2012, mid-dist 2/3 of LV sev HK/AK, mild MR. echo 10/2012: EF 30-35%, sev HK ant-septal & inf walls, GR1DD, mild MR, PASP 33. c. echo 02/2013: EF 30%, GR1DD, mild MR. echo 04/2014: EF 30%, Septal-lat dyssynchrony, global HK, inf AK, GR1DD, mild MR. d. echo 10/2014: EF50-55%, WM nl, GR1DD, septal mild paradox. e. echo 02/2015: EF 50-55%, wm    Depression    Heart attack ([place])    Heel ulcer ([place]) 04/27/2015   History of blood transfusion ~ 2011   "plasma; had neuropathy; couldn't walk"   Hypertension    Hypothyroidism    LBBB (left bundle branch block)    Neuromuscular disorder ([place])    Neuropathy 2011   Obesity, unspecified    OSA on CPAP    Moderate with AHI 23/hr and now on CPAP at 16cm H2O.  Her DME is [facility]   Pure hypercholesterolemia    Sepsis ([place])  09/2018   Type II diabetes mellitus ([place])    Unspecified vitamin D deficiency     Surgeries: Procedure(s): TOTAL KNEE ARTHROPLASTY on 05/07/2022   Consultants:   Discharged Condition: Improved  Hospital Course: [name] is an 66 y.o. female who was admitted 05/07/2022 for operative treatment ofS/P total knee arthroplasty, right. Patient has severe unremitting pain that affects sleep, daily activities, and work/hobbies. After pre-op clearance the patient was taken to the operating room on 05/07/2022 and underwent  Procedure(s): TOTAL KNEE ARTHROPLASTY.    Patient was given perioperative antibiotics:  Anti-infectives (From admission, onward)    Start     Dose/Rate Route Frequency Ordered Stop   05/08/22 1000  cefadroxil (DURICEF) capsule 500 mg  Status:  Discontinued        500 mg Oral 2 times daily 05/07/22 1319 05/08/22 0658   05/08/22 1000  cefadroxil (DURICEF) capsule 500 mg  Status:  Discontinued        500 mg Oral 2 times daily 05/08/22 0655 05/08/22 2246   05/08/22 0000  cefadroxil (DURICEF) 500 MG capsule        500 mg Oral 2 times daily 05/08/22 0844 05/22/22 2359   05/07/22 1830  ceFAZolin (ANCEF) IVPB 2g/100 mL premix        2 g 200 mL/hr over 30 Minutes Intravenous Every 6 hours 05/07/22 1751 05/08/22 0108  05/07/22 1357  vancomycin (VANCOCIN) powder  Status:  Discontinued          As needed 05/07/22 1357 05/07/22 1735   05/07/22 1100  ceFAZolin (ANCEF) IVPB 2g/100 mL premix        2 g 200 mL/hr over 30 Minutes Intravenous On call to O.R. 05/07/22 1053 05/07/22 1257        Patient was given sequential compression devices, early ambulation, and chemoprophylaxis to prevent DVT. Patient worked with PT and was meeting their goals regarding safe ambulation and transfers.  Patient benefited maximally from hospital stay and there were no complications.    Recent vital signs: No data found.   Recent laboratory studies: No results for input(s): "WBC", "HGB", "HCT",  "PLT", "NA", "K", "CL", "CO2", "BUN", "CREATININE", "GLUCOSE", "INR", "CALCIUM" in the last 72 hours.  Invalid input(s): "PT", "2"   Discharge Medications:   Allergies as of 05/08/2022       Reactions   Heparin Other (See Comments)   Can not remember reaction   Nsaids Other (See Comments)   Kidney disease        Medication List     STOP taking these medications    traMADol 50 MG tablet Commonly known as: ULTRAM       TAKE these medications    acetaminophen 500 MG tablet Commonly known as: TYLENOL Take 2 tablets (1,000 mg total) by mouth every 6 (six) hours as needed for moderate pain or headache. What changed: how much to take   amiodarone 200 MG tablet Commonly known as: PACERONE TAKE 1/2 TABLET BY MOUTH EVERY OTHER DAY   aspirin EC 81 MG tablet Take 1 tablet (81 mg total) by mouth daily. Swallow whole.   atorvastatin 10 MG tablet Commonly known as: LIPITOR TAKE 1 TABLET BY MOUTH ONCE DAILY   calcitRIOL 0.5 MCG capsule Commonly known as: ROCALTROL Take 0.5 mcg by mouth every morning.   Calcium Carbonate-Vitamin D3 600-400 MG-UNIT Tabs Take 1 tablet by mouth 2 (two) times a day.   carvedilol 3.125 MG tablet Commonly known as: COREG TAKE 1 TABLET BY MOUTH TWICE (2) DAILY WITH A MEAL   cefadroxil 500 MG capsule Commonly known as: DURICEF Take 1 capsule (500 mg total) by mouth 2 (two) times daily for 14 days.   clopidogrel 75 MG tablet Commonly known as: PLAVIX Take 1 tablet (75 mg total) by mouth daily.   docusate sodium 100 MG capsule Commonly known as: COLACE Take 100 mg by mouth daily.   DULoxetine 30 MG capsule Commonly known as: CYMBALTA TAKE 1 CAPSULE BY MOUTH EVERY NIGHT AT BEDTIME   Fish Oil 1000 MG Caps Take 1,000 mg by mouth 2 (two) times a day.   FreeStyle [place] 2 Reader [facility] [place] 2 Reader   FreeStyle [place] 2 Reader [place] Use to check sugar multiple times per day as directed.  H/o DM2, treated with insulin.    gabapentin 600 MG tablet Commonly known as: NEURONTIN Take 1,200 mg by mouth at bedtime.   Insulin Aspart FlexPen 100 UNIT/ML Commonly known as: NOVOLOG Inject 0-10 Units into the skin 3 (three) times daily before meals. Per sliding scale   insulin glargine 100 UNIT/ML injection Commonly known as: LANTUS Inject 18 Units into the skin at bedtime.   isosorbide mononitrate 30 MG 24 hr tablet Commonly known as: IMDUR TAKE 1 TABLET BY MOUTH ONCE DAILY   levothyroxine 50 MCG tablet Commonly known as: SYNTHROID TAKE 1 TABLET BY MOUTH ONCE DAILY BEFOREBREAKFAST  losartan 25 MG tablet Commonly known as: COZAAR TAKE 1/2 TABLET BY MOUTH DAILY   methocarbamol 500 MG tablet Commonly known as: ROBAXIN Take 1 tablet (500 mg total) by mouth every 6 (six) hours as needed for muscle spasms.   multivitamin with minerals Tabs tablet Take 1 tablet by mouth every morning.   nitroGLYCERIN 0.4 MG SL tablet Commonly known as: NITROSTAT Place 1 tablet (0.4 mg total) under the tongue every 5 (five) minutes as needed for chest pain.   oxyCODONE 5 MG immediate release tablet Commonly known as: Oxy IR/ROXICODONE Take 1 tablet (5 mg total) by mouth every 4 (four) hours as needed for severe pain.   Pen Needles 32G X 6 MM Misc 1 each by Does not apply route 4 (four) times daily.   polyethylene glycol 17 g packet Commonly known as: MIRALAX / GLYCOLAX Take 17 g by mouth daily as needed for mild constipation.   torsemide 20 MG tablet Commonly known as: DEMADEX Take 1 tablet (20 mg total) by mouth 2 (two) times daily.               Discharge Care Instructions  (From admission, onward)           Start     Ordered   05/08/22 0000  Change dressing       Comments: Maintain surgical dressing until follow up in the clinic. If the edges start to pull up, may reinforce with tape. If the dressing is no longer working, may remove and cover with gauze and tape, but must keep the area dry and  clean.  Call with any questions or concerns.   05/08/22 0844            Diagnostic Studies: CUP PACEART REMOTE DEVICE CHECK  Result Date: 05/06/2022 Scheduled remote reviewed. Normal device function.  Optivol crossed threshold 4/16 and is ongoing Next remote 91 days. [place]   Disposition: Discharge disposition: 01-Home or Self Care       Discharge Instructions     Call MD / Call 911   Complete by: As directed    If you experience chest pain or shortness of breath, CALL 911 and be transported to the hospital emergency room.  If you develope a fever above 101 F, pus (white drainage) or increased drainage or redness at the wound, or calf pain, call your surgeon's office.   Change dressing   Complete by: As directed    Maintain surgical dressing until follow up in the clinic. If the edges start to pull up, may reinforce with tape. If the dressing is no longer working, may remove and cover with gauze and tape, but must keep the area dry and clean.  Call with any questions or concerns.   Constipation Prevention   Complete by: As directed    Drink plenty of fluids.  Prune juice may be helpful.  You may use a stool softener, such as Colace (over the counter) 100 mg twice a day.  Use MiraLax (over the counter) for constipation as needed.   Diet - low sodium heart healthy   Complete by: As directed    Increase activity slowly as tolerated   Complete by: As directed    Weight bearing as tolerated with assist device (walker, cane, etc) as directed, use it as long as suggested by your surgeon or therapist, typically at least 4-6 weeks.   Post-operative opioid taper instructions:   Complete by: As directed    POST-OPERATIVE OPIOID TAPER INSTRUCTIONS:  It is important to wean off of your opioid medication as soon as possible. If you do not need pain medication after your surgery it is ok to stop day one. Opioids include: Codeine, Hydrocodone(Norco, Vicodin), Oxycodone(Percocet, oxycontin) and  hydromorphone amongst others.  Long term and even short term use of opiods can cause: Increased pain response Dependence Constipation Depression Respiratory depression And more.  Withdrawal symptoms can include Flu like symptoms Nausea, vomiting And more Techniques to manage these symptoms Hydrate well Eat regular healthy meals Stay active Use relaxation techniques(deep breathing, meditating, yoga) Do Not substitute Alcohol to help with tapering If you have been on opioids for less than two weeks and do not have pain than it is ok to stop all together.  Plan to wean off of opioids This plan should start within one week post op of your joint replacement. Maintain the same interval or time between taking each dose and first decrease the dose.  Cut the total daily intake of opioids by one tablet each day Next start to increase the time between doses. The last dose that should be eliminated is the evening dose.      TED hose   Complete by: As directed    Use stockings (TED hose) for 2 weeks on both leg(s).  You may remove them at night for sleeping.        Follow-up Information     Irving Copas, PA-C. Go on 05/22/2022.   Specialty: Orthopedic Surgery Why: You are scheduled for a follow up appointment on 05-22-22 at 3:30 pm. Contact information: [address] [place] [address] [phone]                  Signed: Irving Copas 05/20/2022, 7:53 AM

## 2022-05-21 ENCOUNTER — Other Ambulatory Visit: Payer: Self-pay | Admitting: Family Medicine

## 2022-05-24 DIAGNOSIS — Z96651 Presence of right artificial knee joint: Secondary | ICD-10-CM | POA: Diagnosis not present

## 2022-05-27 ENCOUNTER — Telehealth: Payer: Self-pay

## 2022-05-27 DIAGNOSIS — Z96651 Presence of right artificial knee joint: Secondary | ICD-10-CM | POA: Diagnosis not present

## 2022-05-27 NOTE — Progress Notes (Signed)
Chronic Care Management Pharmacy Assistant   Name: [name]  MRN: [id number] DOB: August 09, 1956  Reason for Encounter: CCM (Appointment Reminder)  Medications: Outpatient Encounter Medications as of 05/27/2022  Medication Sig Note   acetaminophen (TYLENOL) 500 MG tablet Take 2 tablets (1,000 mg total) by mouth every 6 (six) hours as needed for moderate pain or headache.    amiodarone (PACERONE) 200 MG tablet TAKE 1/2 TABLET BY MOUTH EVERY OTHER DAY    aspirin EC 81 MG EC tablet Take 1 tablet (81 mg total) by mouth daily. Swallow whole.    atorvastatin (LIPITOR) 10 MG tablet TAKE 1 TABLET BY MOUTH ONCE DAILY    calcitRIOL (ROCALTROL) 0.5 MCG capsule Take 0.5 mcg by mouth every morning.    Calcium Carb-Cholecalciferol (CALCIUM CARBONATE-VITAMIN D3) 600-400 MG-UNIT TABS Take 1 tablet by mouth 2 (two) times a day.    carvedilol (COREG) 3.125 MG tablet TAKE 1 TABLET BY MOUTH TWICE (2) DAILY WITH A MEAL    clopidogrel (PLAVIX) 75 MG tablet Take 1 tablet (75 mg total) by mouth daily.    Continuous Blood Gluc Receiver (FREESTYLE LIBRE 2 READER) DEVI Use to check sugar multiple times per day as directed.  H/o DM2, treated with insulin.    Continuous Blood Gluc Receiver (FREESTYLE LIBRE 2 READER) DEVI FreeStyle Libre 2 Reader    docusate sodium (COLACE) 100 MG capsule Take 100 mg by mouth daily.    DULoxetine (CYMBALTA) 30 MG capsule Take 1 capsule (30 mg total) by mouth at bedtime.    gabapentin (NEURONTIN) 300 MG capsule Take 300 mg by mouth 3 (three) times daily.    insulin glargine (LANTUS) 100 UNIT/ML injection Inject 18 Units into the skin at bedtime. 05/07/2022: 8 units sq   Insulin Pen Needle (PEN NEEDLES) 32G X 6 MM MISC 1 each by Does not apply route 4 (four) times daily.    isosorbide mononitrate (IMDUR) 30 MG 24 hr tablet TAKE 1 TABLET BY MOUTH ONCE DAILY    levothyroxine (SYNTHROID) 50 MCG tablet TAKE 1 TABLET BY MOUTH ONCE DAILY BEFOREBREAKFAST    losartan (COZAAR) 25 MG tablet  TAKE 1/2 TABLET BY MOUTH DAILY    methocarbamol (ROBAXIN) 500 MG tablet Take 1 tablet (500 mg total) by mouth every 6 (six) hours as needed for muscle spasms.    Multiple Vitamin (MULTIVITAMIN WITH MINERALS) TABS tablet Take 1 tablet by mouth every morning.    nitroGLYCERIN (NITROSTAT) 0.4 MG SL tablet Place 1 tablet (0.4 mg total) under the tongue every 5 (five) minutes as needed for chest pain.    NOVOLOG FLEXPEN 100 UNIT/ML FlexPen INJECT 0.05 ML (5 UNITS TOTAL) INTO THE SKIN 3 TIMES DAILY BEFORE MEALS    Omega-3 Fatty Acids (FISH OIL) 1000 MG CAPS Take 1,000 mg by mouth 2 (two) times a day.    oxyCODONE (OXY IR/ROXICODONE) 5 MG immediate release tablet Take 1 tablet (5 mg total) by mouth every 4 (four) hours as needed for severe pain.    polyethylene glycol (MIRALAX / GLYCOLAX) 17 g packet Take 17 g by mouth daily as needed for mild constipation.    torsemide (DEMADEX) 20 MG tablet Take 1 tablet (20 mg total) by mouth 2 (two) times daily.    No facility-administered encounter medications on file as of 05/27/2022.   [name] was contacted to remind of upcoming telephone visit with Charlene Brooke on 05/30/22 at 3:00. Patient was reminded to have any blood glucose and blood pressure readings available  for review at appointment.   Message was left reminding patient of appointment.  CCM referral has been placed prior to visit?  No   Star Rating Drugs: Medication:  Last Fill: Day Supply Atorvastatin 10 mg 05/15/22 30 Losartan 25 mg 03/19/22 90 Fill dates verified with [place], CPP notified  Marijean Niemann, [name] Pharmacy Assistant [phone]

## 2022-05-28 ENCOUNTER — Other Ambulatory Visit: Payer: Self-pay | Admitting: Family Medicine

## 2022-05-28 ENCOUNTER — Encounter: Payer: HMO | Admitting: Internal Medicine

## 2022-05-29 NOTE — Telephone Encounter (Signed)
Patient states she takes 1 oxycodone before PT which is twice a week. States she is making good progress with that. Takes 2 tramadol before bed at night.

## 2022-05-29 NOTE — Telephone Encounter (Signed)
Noted.  Sent.  Thanks. 

## 2022-05-29 NOTE — Telephone Encounter (Signed)
Refill request for TRAMADOL HCL 50 MG TAB  LOV - 04/23/22 Next OV - not scheduled Last refill - 02/13/22 #60/2; looks like it was discontinued by the hospital but gave her two refills.

## 2022-05-29 NOTE — Telephone Encounter (Signed)
Please verify current use vs oxycodone and let me know.  I hope she is making progress from her knee surgery.  Thanks.

## 2022-05-30 ENCOUNTER — Ambulatory Visit: Payer: HMO | Admitting: Pharmacist

## 2022-05-30 DIAGNOSIS — E1142 Type 2 diabetes mellitus with diabetic polyneuropathy: Secondary | ICD-10-CM

## 2022-05-30 DIAGNOSIS — I5022 Chronic systolic (congestive) heart failure: Secondary | ICD-10-CM

## 2022-05-30 DIAGNOSIS — E7800 Pure hypercholesterolemia, unspecified: Secondary | ICD-10-CM

## 2022-05-30 DIAGNOSIS — N184 Chronic kidney disease, stage 4 (severe): Secondary | ICD-10-CM

## 2022-05-30 DIAGNOSIS — I10 Essential (primary) hypertension: Secondary | ICD-10-CM

## 2022-05-30 DIAGNOSIS — I25118 Atherosclerotic heart disease of native coronary artery with other forms of angina pectoris: Secondary | ICD-10-CM

## 2022-05-30 DIAGNOSIS — I480 Paroxysmal atrial fibrillation: Secondary | ICD-10-CM

## 2022-05-30 NOTE — Progress Notes (Signed)
Chronic Care Management Pharmacy Note  06/06/2022 Name:  [name] MRN:  [id number] DOB:  07/16/1956  Summary: CCM F/U call -DM: A1c 6.4% (04/2022); per Freestyle Libre avg glucose 153 (7-days). She has been off Ozempic since May due to high cost in donut hole, sugars seems to be well controlled even without it. Can consider PAP in future if it needs to be restarted. -Pt reports vast improvement in knee pain since TKA 05/07/22  Recommendations: -No med changes  Follow up [place]ia will call patient 3 month for DM update -Pharmacist follow up televisit scheduled for 6 months -PCP F/U due Nov 2023 (A1c)   Subjective: [name] is an 66 y.o. year old female who is a primary patient of Damita Dunnings, Elveria Rising, MD.  The CCM team was consulted for assistance with disease management and care coordination needs.    Engaged with patient by telephone for follow up visit in response to provider referral for pharmacy case management and/or care coordination services.   Consent to Services:  The patient was given information about Chronic Care Management services, agreed to services, and gave verbal consent prior to initiation of services.  Please see initial visit note for detailed documentation.   Patient Care Team: [name], MD as PCP - General (Family Medicine) Minna Merritts, MD as PCP - Cardiology (Cardiology) Deboraha Sprang, MD as PCP - Electrophysiology (Cardiology) Bensimhon, Shaune Pascal, MD as PCP - Advanced Heart Failure (Cardiology) Calvert Cantor, MD as Consulting Physician (Ophthalmology) Lavonia Dana, MD as Consulting Physician (Internal Medicine) Elvina Mattes, Adele Schilder (Inactive) as Attending Physician (Podiatry) Tsosie Billing, MD as Consulting Physician (Infectious Diseases) Alda Berthold, DO as Consulting Physician (Neurology) [name], [facility] as Pharmacist (Pharmacist) Earlie Server, MD as Consulting Physician  (Oncology)   Patient has been living with her mother since her toe amputation so she can help with dressing changes.   Recent office visits: 04/23/22 Dr Damita Dunnings OV: A1c 6.4. Stay off [place].  If AM if below 100, then cut 1 unit of insulin a day.  If above 150, then add 1 unit.  If 100-150, then no change.    01/30/22 repeat BMP - Cr improved. Repeat BMP 1 month. 01/15/22 Dr Damita Dunnings OV: f/u CKD. Updated amiodarone 200 mg to 1/2 tab QOD, ferrous sulfate to QOD, Gabapentin to 300 mg daily, Novolog to SSI. Cr significnatly worse - advised to hold losartan and torsemide x 2 days, then resume. Repeat BMP 1 week.  09/24/21 Dr Damita Dunnings VV (telephone): advised chekcing pre and post-meal sugars. Rec CGM. Labs ordered (CMP, CBC, A1C, lipids)  02/15/21 Damita Dunnings (PCP) [facility] f/u. Osteomyelitis. Reduce Gabapentin 600 mg HS & start Novolog sliding scale. Advised to restart torsemide 20 mg daily PRN leg swelling. Recheck BMP/K in 1 week.  Recent consult visits: 04/05/22 Dr Haroldine Laws (HF clinic): no changes.  03/06/22 Dr Holley Raring (Nephrology): f/u CKD. GFR 23. A1c 7.7%. Hold [place] Glade due to low GFR.  01/03/22 Dr Scot Dock (Vascular): new pt - PAD. S/p L leg balloon angioplasty. Preop eval for TKA today. Will require aggressive DVT ppx post-op.  11/20/21 Dr Caryl Comes (Cardiology): f/u NICM, CHF. Decrease amiodarone to 200 mg 5x per week. Cr 2.18, GFR 24 - requires eval per PCP.  11/14/21 NP Allena Katz (HF clinic): f/u HF. Labs stable. Ordered ABI, ECHO. No med changes.  Hospital visits: 05/07/22 admission for TKA.  10/12/21 ED visit [facility]): CHF exacerbation. IV lasix in ED, temporarily increased torsemide for  5 days.  Admitted 02/2021 for cellulitis. She underwent amputation of left great toe 5/27. Repeat arterial studies 5/27 showed a 50-59% stenosis on the distal left popliteal stent segment, no new interventions per vascular. ID was consulted and recommended 6 weeks of abx. Tunneled central line placed (PICC avoided  due to CKD) and she was discharged on Daptomycin  Admitted 01/2021 for osteomyelitis of left foot after failing outpatient management. Had initial surgery (I&D, bone biopsy, and sesamoidectomy). Vascular was consulted after it was felt she was high risk for limb loss due to dampened monophasic signals in left foot on ABI. She underwent CO2 angiogram with angioplasty of 3 separate in-stent stenoses in the left superficial femoral artery, and then revision of surgery (bone resection of 5th metatarsal head and base of the prox phalanx of the 5th). Torsemide held at discharge due to worsening renal function.  Objective:  Lab Results  Component Value Date   CREATININE 2.08 (H) 05/08/2022   BUN 41 (H) 05/08/2022   GFR 23.72 (L) 01/30/2022   GFRNONAA 25 (L) 05/08/2022   GFRAA 23 (L) 11/05/2019   NA 137 05/08/2022   K 5.1 05/08/2022   CALCIUM 8.8 (L) 05/08/2022   CO2 27 05/08/2022   GLUCOSE 389 (H) 05/08/2022    Lab Results  Component Value Date/Time   HGBA1C 6.4 (A) 04/23/2022 04:10 PM   HGBA1C 7.7 (H) 01/15/2022 03:49 PM   HGBA1C 8.1 (H) 10/03/2021 11:37 AM   FRUCTOSAMINE 305 (H) 12/24/2018 05:53 PM   GFR 23.72 (L) 01/30/2022 12:43 PM   GFR 15.31 (L) 01/15/2022 03:49 PM   MICROALBUR 6.9 08/06/2016 02:32 PM   MICROALBUR 10.0 12/12/2015 03:48 PM    Last diabetic Eye exam: No results found for: "HMDIABEYEEXA"  Last diabetic Foot exam: No results found for: "HMDIABFOOTEX"   Lab Results  Component Value Date   CHOL 96 10/03/2021   HDL 38.90 (L) 10/03/2021   LDLCALC 43 10/03/2021   TRIG 70.0 10/03/2021   CHOLHDL 2 10/03/2021       Latest Ref Rng & Units 04/10/2022   11:55 AM 10/03/2021   11:37 AM 03/03/2021    1:31 AM  Hepatic Function  Total Protein 6.5 - 8.1 g/dL 7.3  6.4  6.1   Albumin 3.5 - 5.0 g/dL 3.7  3.2  3.0   AST 15 - 41 U/L 19  10  16    ALT 0 - 44 U/L 15  7  14    Alk Phosphatase 38 - 126 U/L 76  82  55   Total Bilirubin 0.3 - 1.2 mg/dL 0.4  0.3  0.4     Lab Results   Component Value Date/Time   TSH 1.120 11/20/2021 12:32 PM   TSH 1.160 03/27/2021 12:47 PM   FREET4 1.19 03/22/2019 11:59 AM   FREET4 1.1 04/16/2016 04:50 PM       Latest Ref Rng & Units 05/08/2022    2:40 AM 04/10/2022   11:55 AM 01/15/2022    3:49 PM  CBC  WBC 4.0 - 10.5 K/uL 7.6  7.5  7.3   Hemoglobin 12.0 - 15.0 g/dL 9.4  11.1  9.6   Hematocrit 36.0 - 46.0 % 29.5  35.0  29.3   Platelets 150 - 400 K/uL 174  210  220.0     Lab Results  Component Value Date/Time   VD25OH 32 06/01/2012 10:00 AM   CHA2DS2-VASc Score = 8  The patient's score is based upon: CHF History: 1 HTN History: 1 Diabetes  History: 1 Stroke History: 2 Vascular Disease History: 1 Age Score: 1 Gender Score: 1     Clinical ASCVD: Yes  The ASCVD Risk score (Arnett DK, et al., 2019) failed to calculate for the following reasons:   The patient has a prior MI or stroke diagnosis       03/30/2021   10:46 AM 03/21/2021    9:24 AM 09/28/2020   10:36 AM  Depression screen PHQ 2/9  Decreased Interest 0 0 0  Down, Depressed, Hopeless  0 1  PHQ - 2 Score 0 0 1  Altered sleeping  0 0  Tired, decreased energy  0 3  Change in appetite  0 3  Feeling bad or failure about yourself   0 0  Trouble concentrating  0 0  Moving slowly or fidgety/restless  0 0  Suicidal thoughts  0 0  PHQ-9 Score  0 7  Difficult doing work/chores  Not difficult at all Not difficult at all    Social History   Tobacco Use  Smoking Status Never  Smokeless Tobacco Never   BP Readings from Last 3 Encounters:  05/08/22 (!) 133/53  04/26/22 123/66  04/23/22 (!) 100/52   Pulse Readings from Last 3 Encounters:  05/08/22 60  04/26/22 (!) 56  04/23/22 63   Wt Readings from Last 3 Encounters:  05/20/22 258 lb (117 kg)  05/07/22 258 lb (117 kg)  04/26/22 258 lb (117 kg)   BMI Readings from Last 3 Encounters:  05/20/22 39.81 kg/m  05/07/22 39.81 kg/m  04/26/22 39.81 kg/m    Assessment/Interventions: Review of patient past  medical history, allergies, medications, health status, including review of consultants reports, laboratory and other test data, was performed as part of comprehensive evaluation and provision of chronic care management services.   SDOH:  (Social Determinants of Health) assessments and interventions performed: Yes  SDOH Screenings   Alcohol Screen: Low Risk  (03/21/2021)   Alcohol Screen    Last Alcohol Screening Score (AUDIT): 0  Depression (PHQ2-9): Low Risk  (03/30/2021)   Depression (PHQ2-9)    PHQ-2 Score: 0  Financial Resource Strain: Low Risk  (03/21/2021)   Overall Financial Resource Strain (CARDIA)    Difficulty of Paying Living Expenses: Not hard at all  Food Insecurity: No Food Insecurity (03/21/2021)   Hunger Vital Sign    Worried About Running Out of Food in the Last Year: Never true    [place] in the Last Year: Never true  Housing: Low Risk  (03/21/2021)   Housing    Last Housing Risk Score: 0  Physical Activity: Inactive (03/21/2021)   Exercise Vital Sign    Days of Exercise per Week: 0 days    Minutes of Exercise per Session: 0 min  Social Connections: Somewhat Isolated (09/08/2017)   Social Connection and Isolation Panel [NHANES]    Frequency of Communication with Friends and Family: More than three times a week    Frequency of Social Gatherings with Friends and Family: More than three times a week    Attends Religious Services: Never    Marine scientist or Organizations: No    Attends Archivist Meetings: Never    Marital Status: Married  Stress: No Stress Concern Present (03/21/2021)   [place]    Feeling of Stress : Not at all  Tobacco Use: Low Risk  (05/20/2022)   Patient History    Smoking Tobacco Use: Never  Smokeless Tobacco Use: Never    Passive Exposure: Not on file  Transportation Needs: No Transportation Needs (03/21/2021)   PRAPARE - Radiographer, therapeutic (Medical): No    Lack of Transportation (Non-Medical): No    CCM Care Plan  Allergies  Allergen Reactions   Heparin Other (See Comments)    Can not remember reaction   Nsaids Other (See Comments)    Kidney disease    Medications Reviewed Today     Reviewed by Nira Conn, CMA (Certified Medical Assistant) on 05/20/22 at 1109  Med List Status: <None>   Medication Order Taking? Sig Documenting Provider Last Dose Status Informant  acetaminophen (TYLENOL) 500 MG tablet [id number] Yes Take 2 tablets (1,000 mg total) by mouth every 6 (six) hours as needed for moderate pain or headache. Irving Copas, PA-C Taking Active   amiodarone (PACERONE) 200 MG tablet [id number] Yes TAKE 1/2 TABLET BY MOUTH EVERY OTHER DAY [name], MD Taking Active Self  aspirin EC 81 MG EC tablet [id number] Yes Take 1 tablet (81 mg total) by mouth daily. Swallow whole. Nita Sells, MD Taking Active Self  atorvastatin (LIPITOR) 10 MG tablet [id number] Yes TAKE 1 TABLET BY MOUTH ONCE DAILY [name], MD Taking Active Self  calcitRIOL (ROCALTROL) 0.5 MCG capsule [id number] Yes Take 0.5 mcg by mouth every morning. [provider] Taking Active Self  Calcium Carb-Cholecalciferol (CALCIUM CARBONATE-VITAMIN D3) 600-400 MG-UNIT TABS [id number] Yes Take 1 tablet by mouth 2 (two) times a day. [provider] Taking Active Self  carvedilol (COREG) 3.125 MG tablet [id number] Yes TAKE 1 TABLET BY MOUTH TWICE (2) DAILY WITH A MEAL [name], MD Taking Active Self  cefadroxil (DURICEF) 500 MG capsule [id number] Yes Take 1 capsule (500 mg total) by mouth 2 (two) times daily for 14 days. Irving Copas, PA-C Taking Active   clopidogrel (PLAVIX) 75 MG tablet [id number] Yes Take 1 tablet (75 mg total) by mouth daily. Deboraha Sprang, MD Taking Active Self  Continuous Blood Gluc Receiver (FREESTYLE LIBRE 2 READER) DEVI [id number] Yes Use to check sugar multiple times per day  as directed.  H/o DM2, treated with insulin. [name], MD Taking Active Self  Continuous Blood Gluc Receiver (FREESTYLE LIBRE 2 READER) Kerrin Mo [id number] Yes FreeStyle Libre 2 Reader [provider] Taking Active Self  docusate sodium (COLACE) 100 MG capsule [id number] Yes Take 100 mg by mouth daily. [provider] Taking Active Self  DULoxetine (CYMBALTA) 30 MG capsule [id number] Yes TAKE 1 CAPSULE BY MOUTH EVERY NIGHT AT BEDTIME Narda Amber K, DO Taking Active Self  gabapentin (NEURONTIN) 600 MG tablet [id number] Yes Take 1,200 mg by mouth at bedtime. [provider] Taking Active Self  Insulin Aspart FlexPen (NOVOLOG) 100 UNIT/ML [id number] Yes Inject 0-10 Units into the skin 3 (three) times daily before meals. Per sliding scale [name], MD Taking Active Self           Med Note Thressa Sheller, GWENDOLYN D   Tue May 07, 2022 11:12 AM) 4 units sq   insulin glargine (LANTUS) 100 UNIT/ML injection [id number] Yes Inject 18 Units into the skin at bedtime. [provider] Taking Active Self           Med Note Thressa Sheller, GWENDOLYN D   Tue May 07, 2022 11:13 AM) 8 units sq  Insulin Pen Needle (PEN NEEDLES) 32G X 6 MM MISC [id number] Yes 1 each by Does not apply route  4 (four) times daily. Jacelyn Pi, Lilia Argue, MD Taking Active Self  isosorbide mononitrate (IMDUR) 30 MG 24 hr tablet [id number] Yes TAKE 1 TABLET BY MOUTH ONCE DAILY Bensimhon, Shaune Pascal, MD Taking Active Self  levothyroxine (SYNTHROID) 50 MCG tablet [id number] Yes TAKE 1 TABLET BY MOUTH ONCE DAILY BEFOREBREAKFAST [name], MD Taking Active Self  losartan (COZAAR) 25 MG tablet [id number] Yes TAKE 1/2 TABLET BY MOUTH DAILY Bensimhon, Shaune Pascal, MD Taking Active Self  methocarbamol (ROBAXIN) 500 MG tablet [id number] Yes Take 1 tablet (500 mg total) by mouth every 6 (six) hours as needed for muscle spasms. Irving Copas, PA-C Taking Active   Multiple Vitamin (MULTIVITAMIN WITH MINERALS) TABS tablet  [id number] Yes Take 1 tablet by mouth every morning. [provider] Taking Active Self  nitroGLYCERIN (NITROSTAT) 0.4 MG SL tablet [id number] Yes Place 1 tablet (0.4 mg total) under the tongue every 5 (five) minutes as needed for chest pain. Deboraha Sprang, MD Taking Active Self  Omega-3 Fatty Acids (FISH OIL) 1000 MG CAPS [id number] Yes Take 1,000 mg by mouth 2 (two) times a day. [provider] Taking Active Self  oxyCODONE (OXY IR/ROXICODONE) 5 MG immediate release tablet [id number] Yes Take 1 tablet (5 mg total) by mouth every 4 (four) hours as needed for severe pain. Irving Copas, PA-C Taking Active   polyethylene glycol (MIRALAX / GLYCOLAX) 17 g packet [id number] Yes Take 17 g by mouth daily as needed for mild constipation. Irving Copas, PA-C Taking Active   torsemide (DEMADEX) 20 MG tablet [id number] Yes Take 1 tablet (20 mg total) by mouth 2 (two) times daily. Bensimhon, Shaune Pascal, MD Taking Active             Patient Active Problem List   Diagnosis Date Noted   S/P total knee arthroplasty, right 05/07/2022   Knee pain 04/24/2022   Localized osteoarthritis of knees, bilateral 07/25/2021   Medication monitoring encounter 03/31/2021   Osteomyelitis ([place]) 02/18/2021   CKD (chronic kidney disease) stage 4, GFR 15-29 ml/min ([place]) 01/28/2021   AICD (automatic cardioverter/defibrillator) present 01/28/2021   Diabetic foot ulcer ([place]) 11/24/2020   Advance care planning 10/04/2020   Secondary hyperparathyroidism of renal origin ([place]) 07/20/2019   Hyperkalemia 07/20/2019   Benign hypertensive kidney disease with chronic kidney disease 07/20/2019   Anemia in chronic kidney disease 07/20/2019   Positive antinuclear antibody 07/20/2019   Proteinuria 07/20/2019   Acute on chronic systolic (congestive) heart failure ([place]) 03/23/2019   UTI (urinary tract infection) 03/09/2019   CAD (coronary artery disease) 03/09/2019   DM type 2, uncontrolled, with renal complications  76/81/1572   Atrial tachycardia ([place])    Acute kidney injury superimposed on chronic kidney disease ([place])    AKI (acute kidney injury) ([place])    Weakness generalized 12/22/2018   Shortness of breath 12/22/2018   Palliative care encounter 12/22/2018   Endocarditis 12/14/2018   NSTEMI (non-ST elevated myocardial infarction) ([place]) 11/22/2018   Acute on chronic combined systolic and diastolic CHF (congestive heart failure) ([place]) 10/28/2018   Lymphedema 10/28/2018   Severe sepsis ([place]) 09/28/2018   History of amputation of lesser toe of right foot ([place]) 04/19/2018   Presence of cardiac resynchronization therapy defibrillator (CRT-D) 04/19/2018   Paraparesis of both lower limbs ([place]) 03/29/2018   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   Paroxysmal atrial fibrillation ([place]) 03/29/2018   Class 3 obesity due to excess calories with serious comorbidity and body mass index (BMI) of 40.0 to 44.9 in adult  09/02/2016   History of CVA (cerebrovascular accident) 11/11/2015   Chronic renal insufficiency 08/04/2015   OSA (obstructive sleep apnea) 08/15/2014   Morbid obesity ([place]) 10/26/2013   Essential hypertension, benign 04/29/2013   Pure hypercholesterolemia 12/07/2012   Iron deficiency anemia 12/07/2012   Diabetic peripheral neuropathy associated with type 2 diabetes mellitus ([place]) 17/51/0258   Chronic systolic congestive heart failure ([place]) 07/29/2012   Left bundle-branch block 07/25/2012   Coronary artery disease involving native coronary artery of native heart without angina pectoris 07/25/2012   Heart failure ([place]) 07/21/2012   Hyperlipidemia 07/21/2012    Immunization History  Administered Date(s) Administered   Fluad Quad(high Dose 65+) 10/03/2021   Hepatitis B 03/16/2013   Hepatitis B, adult 03/30/2014, 07/18/2014   Influenza Split 07/21/2012, 07/03/2020   Influenza, High Dose Seasonal PF 07/01/2019   Influenza,inj,Quad PF,6+ Mos 06/16/2013, 07/07/2014, 06/18/2015, 08/06/2016,  07/20/2018   Influenza-Unspecified 07/23/2017, 07/22/2018, 06/11/2019   PFIZER Comirnaty(Gray Top)Covid-19 Tri-Sucrose Vaccine 06/21/2021   PFIZER(Purple Top)SARS-COV-2 Vaccination 11/21/2019, 12/21/2019, 07/19/2020   PNEUMOCOCCAL CONJUGATE-20 06/21/2021   Pneumococcal Polysaccharide-23 08/08/2011, 06/18/2015, 08/13/2016   Tdap 03/07/2010, 05/03/2016   Zoster, Live 04/06/2014    Conditions to be addressed/monitored:  Hypertension, Hyperlipidemia, Diabetes, Atrial Fibrillation, Heart Failure, Coronary Artery Disease, and Chronic Kidney Disease  Care Plan : [place]  Updates made by [name], [place] since 06/06/2022 12:00 AM     Problem: Hypertension, Hyperlipidemia, Diabetes, Atrial Fibrillation, Heart Failure, Coronary Artery Disease, and Chronic Kidney Disease   Priority: High     Long-Range Goal: Disease management   Start Date: 06/29/2021  Expected End Date: 12/05/2022  This Visit's Progress: On track  Recent Progress: On track  Priority: High  Note:   Current Barriers:  Unable to maintain control of DM, CKD   Pharmacist Clinical Goal(s):  Patient will adhere to plan to optimize therapeutic regimen for DM, HF as evidenced by report of adherence to recommended medication management changes through collaboration with PharmD and provider.    Interventions: 1:1 collaboration with [name], MD regarding development and update of comprehensive plan of care as evidenced by provider attestation and co-signature Inter-disciplinary care team collaboration (see longitudinal plan of care) Comprehensive medication review performed; medication list updated in electronic medical record   Hyperlipidemia: (LDL goal < 70) -Controlled - LDL43 (09/2021) at goal; pt reports compliance with medications as prescribed; she endorses some bleeding issues - when she nicks herself it takes longer to stop bleeding, she denies blood in urine/stool -Hx CAD - MI 07/2012 w/  stent; Hx stroke -Current treatment: Atorvastatin 10 mg daily - Appropriate, Effective, Safe, Accessible Isosorbide MN 30 mg daily - Appropriate, Effective, Safe, Accessible Nitroglycerin 0.4 mg SL prn - Appropriate, Effective, Safe, Accessible Clopidogrel 75 mg daily HS - Appropriate, Effective, Safe, Accessible Aspirin 81 mg daily - Appropriate, Effective, Safe, Accessible -Educated on Cholesterol goals; Benefits of statin for ASCVD risk reduction; -Counseled on benefits of clopidogrel/aspirin for stent patency; discussed nuisance bleeding vs major bleeding; counseled on signs/symptoms of major bleeding and when to seek emergency care -Recommended to continue current medication   Diabetes (A1c goal <7%) -Controlled - A1c 6.4% (04/2022) at goal; pt endorses compliance with insulin regimen as prescribed; she has been off Ozempic since late May due to high cost in donut hole -she reports she typically takes Novolog twice a day since she eats 2 meals per day - usually ~5 units in AM, 6-7 units with PM meal -Pt reports rare hypoglycemia; she  uses peanut butter for hypoglycemic episodes -Home BG (via [employer] - uses reader) - avg 153 (7 days) -Hx toe amputation, osteomyelitis -Current medications: Lantus 18 units daily HS -Appropriate, Effective, Query Safe Novolog 5-10 units TID per sliding scale - usually BID -Appropriate, Effective, Safe, Accessible Freestyle Libre 2 (w/ reader) - Appropriate, Effective, Safe, Accessible -Medications previously tried: Trulicity, glimepiride, Jardiance, metformin, Januvia, Ozempic (cost in donut hole), Farxiga (low GFR) -Reviewed home BG - per CGM sugars appear controlled even without Ozempic; considered pursuing PAP for Ozempic but if patient can control BG without it she would like to -Recommend to continue current medication; consider Ozempic PAP in future if needed   Atrial [place]/NSVT/AFIB (Goal: prevent stroke and major bleeding) -Controlled -  pt reports palpitations have improved since being on amiodarone -Clarified Afib dx previously - pt had 1 hr of SCAF on device, no repeat events; cardiology has not recommended anticoagulation -CHADSVASC: 8; per chart review patient has never been on anticoagulation for Afib (was briefly on Eliquis for a DVT in 2016); she is currently on DAPT for hx CAD w/ stent -Current treatment: Amiodarone 200 mg - 1/2 tab QOD Appropriate, Effective, Safe, Accessible Carvedilol 3.125 mg AM - Appropriate, Effective, Safe, Accessible -Medications previously tried: Eliquis (for DVT, 2016) -Counseled on benefits of rate/rhythm control drugs to prevent tachycardia -Recommended to continue current medication   Heart Failure (Goal: manage symptoms and prevent exacerbations) -Controlled - pt reports swelling is under control with 40 mg of torsemide/day; her kidney function deteriorated with 60 mg of torsemide previously but is now stable -Current home BP/HR readings: 139/80, HR high 50s-low 60s -Last ejection fraction: 50-55% (Date: 11/26/21) -HF type: Diastolic; NYHA Class: I-II -Current treatment: Carvedilol 3.125 mg BID -Appropriate, Effective, Safe, Accessible Isosorbide MN 30 mg daily -Appropriate, Effective, Safe, Accessible Losartan 25 mg - 1/2 tab daily -Appropriate, Effective, Safe, Accessible Torsemide 20 mg - 2 tab daily-Appropriate, Effective, Query Safe -Medications previously tried: Jardiance 10 mg -Educated on Benefits of medications for managing symptoms and prolonging life; Importance of blood pressure control -Recommend to continue current medication   CKD stage 4 / HyperPTH (Goal: prevent progression) -Query controlled -  -Hx AKI 01/2022 on torsemide/losartan, improved with holding medication; she has since restarted losartan at 1/2 dose -Pt reports she is overdue for nephrology appt - they have declined to refill calcitriol until she makes an appt; per pt she needs fire dept to help her down  the steps so consolidating lab/doctor appts would be helpful -All medications assessed for renal dosing and appropriateness in chronic kidney disease. -Current treatment  Calcitriol 0.5 mg daily AM -Appropriate, Effective, Safe, Accessible Ferrous sulfate 325 mg BID -Appropriate, Effective, Safe, Accessible Losartan 25 mg - 1/2 tab daily -Appropriate, Effective, Safe, Accessible -Counseled on maintaining adequate hydration; importance of BP and DM control to prevent CKD progression; avoidance of NSAIDs and other nephrotoxins -Recommended to continue current medication   Health Maintenance -Vaccine gaps: Shingrix, covid booster, flu   Patient Goals/Self-Care Activities Patient will:  - take medications as prescribed -focus on medication adherence by pill box -check glucose daily, document, and provide at future appointments -check blood pressure daily, document, and provide at future appointments    Medication Assistance: None required.  Patient affirms current coverage meets needs.  Compliance/Adherence/Medication fill history: Care Gaps: DEXA Eye exam (04/09/19) Foot exam (07/21/19)  Star-Rating Drugs: Atorvastatin - PDC inaccurate; pt affirms compliance Losartan - PDC inaccurate; pt affirms compliance Ozempic - PDC inaccurate; pt affirms compliance  Medication Access: Within the past 30 days, how often has patient missed a dose of medication? 0 Is a pillbox or other method used to improve adherence? Yes  Factors that may affect medication adherence? no barriers identified Are meds synced by current pharmacy? No  Are meds delivered by current pharmacy? No  Does patient experience delays in picking up medications due to transportation concerns? No   [facility] Reviewed: Is patient disadvantaged to use [facility]?: No  Current Rx insurance plan: HTA Name and location of Current pharmacy:  [facility] [place] [address] [place] [address] Phone: [phone] Fax: [phone]  [facility] services reviewed with patient today?: No  Patient requests to transfer care to [facility]?: No  Reason patient declined to change pharmacies: Loyalty to other pharmacy/Patient preference   Care Plan and Follow Up Patient Decision:  Patient agrees to Care Plan and Follow-up.  Plan: Telephone follow up appointment with care management team member scheduled for:  6 months  Charlene Brooke, PharmD, [facility] Clinical Pharmacist [facility] [phone]

## 2022-05-31 DIAGNOSIS — Z96651 Presence of right artificial knee joint: Secondary | ICD-10-CM | POA: Diagnosis not present

## 2022-06-03 DIAGNOSIS — R809 Proteinuria, unspecified: Secondary | ICD-10-CM | POA: Diagnosis not present

## 2022-06-03 DIAGNOSIS — E1122 Type 2 diabetes mellitus with diabetic chronic kidney disease: Secondary | ICD-10-CM | POA: Diagnosis not present

## 2022-06-03 DIAGNOSIS — N2581 Secondary hyperparathyroidism of renal origin: Secondary | ICD-10-CM | POA: Diagnosis not present

## 2022-06-03 DIAGNOSIS — N184 Chronic kidney disease, stage 4 (severe): Secondary | ICD-10-CM | POA: Diagnosis not present

## 2022-06-03 DIAGNOSIS — I10 Essential (primary) hypertension: Secondary | ICD-10-CM | POA: Diagnosis not present

## 2022-06-03 DIAGNOSIS — D631 Anemia in chronic kidney disease: Secondary | ICD-10-CM | POA: Diagnosis not present

## 2022-06-04 DIAGNOSIS — Z96651 Presence of right artificial knee joint: Secondary | ICD-10-CM | POA: Diagnosis not present

## 2022-06-05 DIAGNOSIS — Z89422 Acquired absence of other left toe(s): Secondary | ICD-10-CM | POA: Diagnosis not present

## 2022-06-05 DIAGNOSIS — E1122 Type 2 diabetes mellitus with diabetic chronic kidney disease: Secondary | ICD-10-CM | POA: Diagnosis not present

## 2022-06-05 DIAGNOSIS — Z6839 Body mass index (BMI) 39.0-39.9, adult: Secondary | ICD-10-CM | POA: Diagnosis not present

## 2022-06-05 DIAGNOSIS — N184 Chronic kidney disease, stage 4 (severe): Secondary | ICD-10-CM | POA: Diagnosis not present

## 2022-06-05 DIAGNOSIS — Z794 Long term (current) use of insulin: Secondary | ICD-10-CM | POA: Diagnosis not present

## 2022-06-05 DIAGNOSIS — Z89421 Acquired absence of other right toe(s): Secondary | ICD-10-CM | POA: Diagnosis not present

## 2022-06-05 DIAGNOSIS — I480 Paroxysmal atrial fibrillation: Secondary | ICD-10-CM | POA: Diagnosis not present

## 2022-06-05 DIAGNOSIS — Z89412 Acquired absence of left great toe: Secondary | ICD-10-CM | POA: Diagnosis not present

## 2022-06-05 DIAGNOSIS — M25561 Pain in right knee: Secondary | ICD-10-CM | POA: Diagnosis not present

## 2022-06-05 DIAGNOSIS — I509 Heart failure, unspecified: Secondary | ICD-10-CM | POA: Diagnosis not present

## 2022-06-05 DIAGNOSIS — D692 Other nonthrombocytopenic purpura: Secondary | ICD-10-CM | POA: Diagnosis not present

## 2022-06-05 DIAGNOSIS — Z96651 Presence of right artificial knee joint: Secondary | ICD-10-CM | POA: Diagnosis not present

## 2022-06-06 NOTE — Patient Instructions (Signed)
Visit Information  Phone number for Pharmacist: [phone]   Goals Addressed   None     Care Plan : [place]  Updates made by [name], RPH since 06/06/2022 12:00 AM     Problem: Hypertension, Hyperlipidemia, Diabetes, Atrial Fibrillation, Heart Failure, Coronary Artery Disease, and Chronic Kidney Disease   Priority: High     Long-Range Goal: Disease management   Start Date: 06/29/2021  Expected End Date: 12/05/2022  This Visit's Progress: On track  Recent Progress: On track  Priority: High  Note:   Current Barriers:  Unable to maintain control of DM, CKD   Pharmacist Clinical Goal(s):  Patient will adhere to plan to optimize therapeutic regimen for DM, HF as evidenced by report of adherence to recommended medication management changes through collaboration with PharmD and provider.    Interventions: 1:1 collaboration with [name], MD regarding development and update of comprehensive plan of care as evidenced by provider attestation and co-signature Inter-disciplinary care team collaboration (see longitudinal plan of care) Comprehensive medication review performed; medication list updated in electronic medical record   Hyperlipidemia: (LDL goal < 70) -Controlled - LDL43 (09/2021) at goal; pt reports compliance with medications as prescribed; she endorses some bleeding issues - when she nicks herself it takes longer to stop bleeding, she denies blood in urine/stool -Hx CAD - MI 07/2012 w/ stent; Hx stroke -Current treatment: Atorvastatin 10 mg daily - Appropriate, Effective, Safe, Accessible Isosorbide MN 30 mg daily - Appropriate, Effective, Safe, Accessible Nitroglycerin 0.4 mg SL prn - Appropriate, Effective, Safe, Accessible Clopidogrel 75 mg daily HS - Appropriate, Effective, Safe, Accessible Aspirin 81 mg daily - Appropriate, Effective, Safe, Accessible -Educated on Cholesterol goals; Benefits of statin for ASCVD risk  reduction; -Counseled on benefits of clopidogrel/aspirin for stent patency; discussed nuisance bleeding vs major bleeding; counseled on signs/symptoms of major bleeding and when to seek emergency care -Recommended to continue current medication   Diabetes (A1c goal <7%) -Controlled - A1c 6.4% (04/2022) at goal; pt endorses compliance with insulin regimen as prescribed; she has been off Ozempic since late May due to high cost in donut hole -she reports she typically takes Novolog twice a day since she eats 2 meals per day - usually ~5 units in AM, 6-7 units with PM meal -Pt reports rare hypoglycemia; she uses peanut butter for hypoglycemic episodes -Home BG (via [employer] - uses reader) - avg 153 (7 days) -Hx toe amputation, osteomyelitis -Current medications: Lantus 18 units daily HS -Appropriate, Effective, Query Safe Novolog 5-10 units TID per sliding scale - usually BID -Appropriate, Effective, Safe, Accessible Freestyle Libre 2 (w/ reader) - Appropriate, Effective, Safe, Accessible -Medications previously tried: Trulicity, glimepiride, Jardiance, metformin, Januvia, Ozempic (cost in donut hole), Farxiga (low GFR) -Reviewed home BG - per CGM sugars appear controlled even without Ozempic; considered pursuing PAP for Ozempic but if patient can control BG without it she would like to -Recommend to continue current medication; consider Ozempic PAP in future if needed   Atrial Tachyardia/NSVT/AFIB (Goal: prevent stroke and major bleeding) -Controlled - pt reports palpitations have improved since being on amiodarone -Clarified Afib dx previously - pt had 1 hr of SCAF on device, no repeat events; cardiology has not recommended anticoagulation -CHADSVASC: 8; per chart review patient has never been on anticoagulation for Afib (was briefly on Eliquis for a DVT in 2016); she is currently on DAPT for hx CAD w/ stent -Current treatment: Amiodarone 200 mg - 1/2 tab QOD Appropriate, Effective,  Safe,  Accessible Carvedilol 3.125 mg AM - Appropriate, Effective, Safe, Accessible -Medications previously tried: Eliquis (for DVT, 2016) -Counseled on benefits of rate/rhythm control drugs to prevent tachycardia -Recommended to continue current medication   Heart Failure (Goal: manage symptoms and prevent exacerbations) -Controlled - pt reports swelling is under control with 40 mg of torsemide/day; her kidney function deteriorated with 60 mg of torsemide previously but is now stable -Current home BP/HR readings: 139/80, HR high 50s-low 60s -Last ejection fraction: 50-55% (Date: 11/26/21) -HF type: Diastolic; NYHA Class: I-II -Current treatment: Carvedilol 3.125 mg BID -Appropriate, Effective, Safe, Accessible Isosorbide MN 30 mg daily -Appropriate, Effective, Safe, Accessible Losartan 25 mg - 1/2 tab daily -Appropriate, Effective, Safe, Accessible Torsemide 20 mg - 2 tab daily-Appropriate, Effective, Query Safe -Medications previously tried: Jardiance 10 mg -Educated on Benefits of medications for managing symptoms and prolonging life; Importance of blood pressure control -Recommend to continue current medication   CKD stage 4 / HyperPTH (Goal: prevent progression) -Query controlled -  -Hx AKI 01/2022 on torsemide/losartan, improved with holding medication; she has since restarted losartan at 1/2 dose -Pt reports she is overdue for nephrology appt - they have declined to refill calcitriol until she makes an appt; per pt she needs fire dept to help her down the steps so consolidating lab/doctor appts would be helpful -All medications assessed for renal dosing and appropriateness in chronic kidney disease. -Current treatment  Calcitriol 0.5 mg daily AM -Appropriate, Effective, Safe, Accessible Ferrous sulfate 325 mg BID -Appropriate, Effective, Safe, Accessible Losartan 25 mg - 1/2 tab daily -Appropriate, Effective, Safe, Accessible -Counseled on maintaining adequate hydration; importance of BP  and DM control to prevent CKD progression; avoidance of NSAIDs and other nephrotoxins -Recommended to continue current medication   Health Maintenance -Vaccine gaps: Shingrix, covid booster, flu   Patient Goals/Self-Care Activities Patient will:  - take medications as prescribed -focus on medication adherence by pill box -check glucose daily, document, and provide at future appointments -check blood pressure daily, document, and provide at future appointments      Patient verbalizes understanding of instructions and care plan provided today and agrees to view in [place]. Active MyChart status and patient understanding of how to access instructions and care plan via MyChart confirmed with patient.    Telephone follow up appointment with pharmacy team member scheduled for: 6 months  Charlene Brooke, PharmD, [facility] Clinical Pharmacist [place] [facility] [phone]

## 2022-06-06 NOTE — Progress Notes (Signed)
Remote ICD transmission.   

## 2022-06-07 DIAGNOSIS — Z96651 Presence of right artificial knee joint: Secondary | ICD-10-CM | POA: Diagnosis not present

## 2022-06-10 DIAGNOSIS — Z7902 Long term (current) use of antithrombotics/antiplatelets: Secondary | ICD-10-CM | POA: Diagnosis not present

## 2022-06-10 DIAGNOSIS — G8929 Other chronic pain: Secondary | ICD-10-CM | POA: Diagnosis not present

## 2022-06-10 DIAGNOSIS — Z89429 Acquired absence of other toe(s), unspecified side: Secondary | ICD-10-CM | POA: Diagnosis not present

## 2022-06-10 DIAGNOSIS — M79605 Pain in left leg: Secondary | ICD-10-CM | POA: Diagnosis not present

## 2022-06-10 DIAGNOSIS — M545 Low back pain, unspecified: Secondary | ICD-10-CM | POA: Diagnosis not present

## 2022-06-10 DIAGNOSIS — Z79891 Long term (current) use of opiate analgesic: Secondary | ICD-10-CM | POA: Diagnosis not present

## 2022-06-11 ENCOUNTER — Encounter (HOSPITAL_COMMUNITY): Payer: Self-pay

## 2022-06-11 ENCOUNTER — Emergency Department (HOSPITAL_COMMUNITY)
Admission: EM | Admit: 2022-06-11 | Discharge: 2022-06-11 | Disposition: A | Discharge status: Home / Self Care | Payer: HMO | Attending: Emergency Medicine | Admitting: Emergency Medicine

## 2022-06-11 ENCOUNTER — Encounter: Payer: Self-pay | Admitting: Internal Medicine

## 2022-06-11 DIAGNOSIS — R011 Cardiac murmur, unspecified: Secondary | ICD-10-CM | POA: Diagnosis not present

## 2022-06-11 DIAGNOSIS — E1122 Type 2 diabetes mellitus with diabetic chronic kidney disease: Secondary | ICD-10-CM | POA: Insufficient documentation

## 2022-06-11 DIAGNOSIS — Z7982 Long term (current) use of aspirin: Secondary | ICD-10-CM | POA: Insufficient documentation

## 2022-06-11 DIAGNOSIS — M545 Low back pain, unspecified: Secondary | ICD-10-CM | POA: Diagnosis present

## 2022-06-11 DIAGNOSIS — Z7984 Long term (current) use of oral hypoglycemic drugs: Secondary | ICD-10-CM | POA: Diagnosis not present

## 2022-06-11 DIAGNOSIS — Z96651 Presence of right artificial knee joint: Secondary | ICD-10-CM | POA: Diagnosis not present

## 2022-06-11 DIAGNOSIS — M5442 Lumbago with sciatica, left side: Secondary | ICD-10-CM | POA: Diagnosis not present

## 2022-06-11 DIAGNOSIS — N189 Chronic kidney disease, unspecified: Secondary | ICD-10-CM | POA: Diagnosis not present

## 2022-06-11 DIAGNOSIS — M62830 Muscle spasm of back: Secondary | ICD-10-CM | POA: Diagnosis not present

## 2022-06-11 DIAGNOSIS — I2510 Atherosclerotic heart disease of native coronary artery without angina pectoris: Secondary | ICD-10-CM | POA: Insufficient documentation

## 2022-06-11 DIAGNOSIS — M5432 Sciatica, left side: Secondary | ICD-10-CM

## 2022-06-11 MED ORDER — METHOCARBAMOL 500 MG PO TABS: 500.0000 mg | ORAL_TABLET | Freq: Two times a day (BID) | ORAL | 0 refills | Status: DC

## 2022-06-11 MED ORDER — LIDOCAINE 5 % EX PTCH
2.0000 | MEDICATED_PATCH | CUTANEOUS | Status: DC
  Administered 2022-06-11: 2 via TRANSDERMAL
  Filled 2022-06-11: qty 2

## 2022-06-11 MED ORDER — OXYCODONE-ACETAMINOPHEN 5-325 MG PO TABS
1.0000 | ORAL_TABLET | Freq: Once | ORAL | Status: AC
  Administered 2022-06-11: 1 via ORAL
  Filled 2022-06-11: qty 1

## 2022-06-11 NOTE — ED Provider Triage Note (Signed)
Emergency Medicine Provider Triage Evaluation Note  [name] , a 66 y.o. female  was evaluated in triage.  Pt complains of left low back pain that radiates down left lower extremity x4 days.  Patient states she was at PT on Friday and twisted her low back and began having pain later that evening.  No direct trauma.  Denies saddle paresthesias, bowel/bladder incontinence, lower extremity numbness/tingling, lower extremity weakness.  Patient had a recent right knee replacement on 8/1 by Dr. Alvan Dame. Admits to previous back surgery. NO fever or chills.  Review of Systems  Positive: Back pain Negative: fever  Physical Exam  BP (!) 135/41 (BP Location: Right Arm)   Pulse 71   Temp 99.5 F (37.5 C) (Oral)   Resp 16   Ht '5\' 7"'$  (1.702 m)   Wt 117 kg   SpO2 92%   BMI 40.41 kg/m  Gen:   Awake, no distress   Resp:  Normal effort  MSK:   Moves extremities without difficulty  Other:  No midline tenderness  Medical Decision Making  Medically screening exam initiated at 3:50 PM.  Appropriate orders placed.  [name] was informed that the remainder of the evaluation will be completed by another provider, this initial triage assessment does not replace that evaluation, and the importance of remaining in the ED until their evaluation is complete.  Low back pain without midline tenderness   Suzy Bouchard, PA-C 06/11/22 1552

## 2022-06-11 NOTE — ED Provider Notes (Signed)
[place] DEPT Provider Note   CSN: [id number] Arrival date & time: 06/11/22  1518     History  No chief complaint on file.   [name] is a 66 y.o. female who presents to the ER at this time with concern for pain that starts in her left buttock and hip and radiates down her left leg and started after physical therapy on Friday.  She is 1 month status post right total knee replacement, doing well but states that she feels she has been favoring the right leg with the left.  Pain at home, mild improvement with oral oxycodone at home, some improvement with Robaxin though she states she is out of her prescription.  At baseline patient has some numbness and tingling in the legs anyways from her diabetes, endorses mild increase in the tingling sensation in her left leg at this time.  No saddle anesthesia, no weakness in the leg.  Personally reviewed her medical records which she has had poorly controlled diabetes, hyperlipidemia, CKD, history of CVA, left bundle branch block, CAD with AICD.  She is not anticoagulated.  HPI     Home Medications Prior to Admission medications   Medication Sig Start Date End Date Taking? Authorizing Provider  methocarbamol (ROBAXIN) 500 MG tablet Take 1 tablet (500 mg total) by mouth 2 (two) times daily. 06/11/22  Yes Grainne Knights, Gypsy Balsam, PA-C  acetaminophen (TYLENOL) 500 MG tablet Take 2 tablets (1,000 mg total) by mouth every 6 (six) hours as needed for moderate pain or headache. 05/08/22   Irving Copas, PA-C  amiodarone (PACERONE) 200 MG tablet TAKE 1/2 TABLET BY MOUTH EVERY OTHER DAY 01/15/22   [name], MD  aspirin EC 81 MG EC tablet Take 1 tablet (81 mg total) by mouth daily. Swallow whole. 02/07/21   Nita Sells, MD  atorvastatin (LIPITOR) 10 MG tablet TAKE 1 TABLET BY MOUTH ONCE DAILY 03/12/22   [name], MD  calcitRIOL (ROCALTROL) 0.5 MCG capsule Take 0.5 mcg by mouth every morning. 12/25/20    [provider]  Calcium Carb-Cholecalciferol (CALCIUM CARBONATE-VITAMIN D3) 600-400 MG-UNIT TABS Take 1 tablet by mouth 2 (two) times a day.    [provider]  carvedilol (COREG) 3.125 MG tablet TAKE 1 TABLET BY MOUTH TWICE (2) DAILY WITH A MEAL 05/21/22   [name], MD  clopidogrel (PLAVIX) 75 MG tablet Take 1 tablet (75 mg total) by mouth daily. 12/18/21   Deboraha Sprang, MD  Continuous Blood Gluc Receiver (FREESTYLE LIBRE 2 READER) DEVI Use to check sugar multiple times per day as directed.  H/o DM2, treated with insulin. 03/08/22   [name], MD  Continuous Blood Gluc Receiver (FREESTYLE LIBRE 2 READER) [place] 2 Reader    [provider]  docusate sodium (COLACE) 100 MG capsule Take 100 mg by mouth daily.    [provider]  DULoxetine (CYMBALTA) 30 MG capsule Take 1 capsule (30 mg total) by mouth at bedtime. 05/20/22   Patel, Arvin Collard K, DO  gabapentin (NEURONTIN) 300 MG capsule Take 300 mg by mouth 3 (three) times daily.    [provider]  insulin glargine (LANTUS) 100 UNIT/ML injection Inject 18 Units into the skin at bedtime.    [provider]  Insulin Pen Needle (PEN NEEDLES) 32G X 6 MM MISC 1 each by Does not apply route 4 (four) times daily. 09/06/19   Daleen Squibb, MD  isosorbide mononitrate (IMDUR)  30 MG 24 hr tablet TAKE 1 TABLET BY MOUTH ONCE DAILY 07/05/21   Bensimhon, Shaune Pascal, MD  levothyroxine (SYNTHROID) 50 MCG tablet TAKE 1 TABLET BY MOUTH ONCE DAILY BEFOREBREAKFAST 11/26/21   [name], MD  losartan (COZAAR) 25 MG tablet TAKE 1/2 TABLET BY MOUTH DAILY 12/18/21   Bensimhon, Shaune Pascal, MD  Multiple Vitamin (MULTIVITAMIN WITH MINERALS) TABS tablet Take 1 tablet by mouth every morning.    [provider]  nitroGLYCERIN (NITROSTAT) 0.4 MG SL tablet Place 1 tablet (0.4 mg total) under the tongue every 5 (five) minutes as needed for chest pain. 03/28/20 05/21/22  Deboraha Sprang, MD   NOVOLOG FLEXPEN 100 UNIT/ML FlexPen INJECT 0.05 ML (5 UNITS TOTAL) INTO THE SKIN 3 TIMES DAILY BEFORE MEALS 05/21/22   [name], MD  Omega-3 Fatty Acids (FISH OIL) 1000 MG CAPS Take 1,000 mg by mouth 2 (two) times a day.    [provider]  oxyCODONE (OXY IR/ROXICODONE) 5 MG immediate release tablet Take 1 tablet (5 mg total) by mouth every 4 (four) hours as needed for severe pain. 05/08/22   Irving Copas, PA-C  polyethylene glycol (MIRALAX / GLYCOLAX) 17 g packet Take 17 g by mouth daily as needed for mild constipation. 05/08/22   Irving Copas, PA-C  torsemide (DEMADEX) 20 MG tablet Take 1 tablet (20 mg total) by mouth 2 (two) times daily. 04/29/22 07/28/22  Bensimhon, Shaune Pascal, MD  traMADol (ULTRAM) 50 MG tablet Take 1-2 tablets (50-100 mg total) by mouth at bedtime as needed (don't take with oxycodone.). 05/29/22   [name], MD      Allergies    Heparin and Nsaids    Review of Systems   Review of Systems  Musculoskeletal:  Positive for back pain.    Physical Exam Updated Vital Signs BP (!) 167/64   Pulse 67   Temp 98.7 F (37.1 C) (Oral)   Resp 18   Ht '5\' 7"'$  (1.702 m)   Wt 117 kg   SpO2 96%   BMI 40.41 kg/m  Physical Exam Vitals and nursing note reviewed.  Constitutional:      Appearance: She is obese. She is not ill-appearing or toxic-appearing.  HENT:     Head: Normocephalic and atraumatic.  Eyes:     General: No scleral icterus.       Right eye: No discharge.        Left eye: No discharge.     Conjunctiva/sclera: Conjunctivae normal.  Cardiovascular:     Heart sounds: Murmur heard.  Pulmonary:     Effort: Pulmonary effort is normal.  Abdominal:     General: There is no distension.     Palpations: Abdomen is soft.     Tenderness: There is no abdominal tenderness.  Musculoskeletal:     Cervical back: No bony tenderness.     Thoracic back: No bony tenderness.     Lumbar back: Spasms and tenderness present. No bony tenderness. Normal  range of motion. Negative right straight leg raise test and negative left straight leg raise test.     Right hip: Normal.     Left hip: Tenderness present. No deformity, lacerations, bony tenderness or crepitus. Normal range of motion. Normal strength.       Legs:  Feet:     Comments: Multiple toe amputations without evidence of acute infection.  Skin:    General: Skin is warm and dry.     Capillary Refill: Capillary refill  takes less than 2 seconds.  Neurological:     General: No focal deficit present.     Mental Status: She is alert.     Sensory: Sensation is intact.     Motor: Motor function is intact.     Comments: Symmetric strength and sensation in the lower extremities bilaterally.   Psychiatric:        Mood and Affect: Mood normal.     ED Results / Procedures / Treatments   Labs (all labs ordered are listed, but only abnormal results are displayed) Labs Reviewed - No data to display  EKG None  Radiology No results found.  Procedures Procedures    Medications Ordered in ED Medications  oxyCODONE-acetaminophen (PERCOCET/ROXICET) 5-325 MG per tablet 1 tablet (has no administration in time range)  lidocaine (LIDODERM) 5 % 2 patch (has no administration in time range)    ED Course/ Medical Decision Making/ A&P                           Medical Decision Making 66 year old female who presents with concern for left hip and leg pain x 4 days. No hx of IVDU, diabetic, but no recent surgical procedures or injections near the spine.   HTN on intake, cardiopulmomary exam is normal abdominal exam is benign. Normal and symmetric strength in the lower extremities bilaterally. Trigger point in left buttock, no midline TTP of the spine.   The emergent differential diagnosis for low back pain includes acute ligamentous and muscular injury, cord compression syndrome, pathologic fracture, transverse myelitis, vertebral osteomyelitis, discitis, and epidural abscess.Ddx also  includes lumbar radiculopathy, muscle spasm, and sciatica.     Lidoderm patch and single dose of analgesia offered in the emergency department.  Discharged with course of Robaxin And recommendations to follow-up as discussed with her physical therapist tomorrow.  She already has oral pain medication at home recommend usage of this and Tylenol, NSAIDs to be avoided given history of CKD stage IV.  No further comport in the ER at this time.  No red flag signs or symptoms, clinical concern for more emergent underlying etiology such as epidural abscess or hematoma is exceedingly low.  Annasophia and her daughter voiced understanding of her medical evaluation and treatment plan. Each of their questions answered to their expressed satisfaction.  Return precautions were given.  Patient is well-appearing, stable, and was discharged in good condition..  This chart was dictated using voice recognition software, Dragon. Despite the best efforts of this provider to proofread and correct errors, errors may still occur which can change documentation meaning.     Final Clinical Impression(s) / ED Diagnoses Final diagnoses:  Sciatica of left side    Rx / DC Orders ED Discharge Orders          Ordered    methocarbamol (ROBAXIN) 500 MG tablet  2 times daily        06/11/22 2329              Jemiah Ellen[place], Gypsy Balsam, PA-C 06/11/22 2339    Palumbo, April, MD 06/11/22 2343

## 2022-06-11 NOTE — Discharge Instructions (Addendum)
You were seen in the emergency department today for your low back/leg pain.  Your physical exam and vital signs are very reassuring.  The muscles in your left buttock are in what is called spasm, meaning they are inappropriately tightened up.  This is causing inflammation of the sciatic nerve that runs through the muscle. This can be quite painful.  To help with your pain you may take Tylenol and / or NSAID medication (such as ibuprofen or naproxen) to help with your pain.  Additionally, you have been prescribed a muscle relaxer called Robaxin to help relieve some of the muscle spasm.  Please be advised that this medication may make you very sleepy, so you should not drive or operate heavy machinery while you are taking it.  You may also utilize topical pain relief such as Biofreeze, IcyHot, or topical lidocaine patches.  I also recommend that you apply heat to the area, such as a hot shower or heating pad, and follow heat application with massage of the muscles that are most tight.  Please return to the emergency department if you develop any numbness/tingling/weakness in your arms or legs, any difficulty urinating, or urinary incontinence chest pain, shortness of breath, abdominal pain, nausea or vomiting that does not stop, or any other new severe symptoms.  Please follow up with your physical therapy as previously scheduled.

## 2022-06-11 NOTE — ED Triage Notes (Signed)
Pt presents via EMS from home with c/o back pain radiating to her left leg that started on Friday during PT. Pt is currently in PT for a recent right knee replacement.

## 2022-06-13 ENCOUNTER — Other Ambulatory Visit: Payer: Self-pay | Admitting: Family Medicine

## 2022-06-18 ENCOUNTER — Telehealth: Payer: Self-pay | Admitting: Family Medicine

## 2022-06-18 DIAGNOSIS — M5416 Radiculopathy, lumbar region: Secondary | ICD-10-CM | POA: Insufficient documentation

## 2022-06-18 DIAGNOSIS — M549 Dorsalgia, unspecified: Secondary | ICD-10-CM

## 2022-06-18 NOTE — Telephone Encounter (Signed)
Patient called in stating that she had a total knee replacement. During her PT her sciatic nerve had messed up. She received a referral from her knee surgeon to [facility] for her back. They are now needing a referral for [facility] from Dr. Damita Dunnings for her back. She has an appointment at 12:15 with [facility] and will need it before they can see her in regards to her back. She stated it can be faxed over to [phone]. Please advise. Thank you!

## 2022-06-18 NOTE — Telephone Encounter (Signed)
I put in the referral.  Thanks.  

## 2022-06-18 NOTE — Addendum Note (Signed)
Addended by: [name] on: 06/18/2022 01:48 PM   Modules accepted: Orders

## 2022-06-20 DIAGNOSIS — Z96651 Presence of right artificial knee joint: Secondary | ICD-10-CM | POA: Diagnosis not present

## 2022-06-20 DIAGNOSIS — M5416 Radiculopathy, lumbar region: Secondary | ICD-10-CM | POA: Diagnosis not present

## 2022-06-21 DIAGNOSIS — Z79891 Long term (current) use of opiate analgesic: Secondary | ICD-10-CM | POA: Diagnosis not present

## 2022-06-21 DIAGNOSIS — M545 Low back pain, unspecified: Secondary | ICD-10-CM | POA: Diagnosis not present

## 2022-06-27 DIAGNOSIS — Z5189 Encounter for other specified aftercare: Secondary | ICD-10-CM | POA: Diagnosis not present

## 2022-06-27 DIAGNOSIS — M545 Low back pain, unspecified: Secondary | ICD-10-CM | POA: Diagnosis not present

## 2022-07-01 ENCOUNTER — Other Ambulatory Visit: Payer: Self-pay | Admitting: Internal Medicine

## 2022-07-05 DIAGNOSIS — I252 Old myocardial infarction: Secondary | ICD-10-CM | POA: Insufficient documentation

## 2022-07-05 DIAGNOSIS — M47819 Spondylosis without myelopathy or radiculopathy, site unspecified: Secondary | ICD-10-CM | POA: Insufficient documentation

## 2022-07-08 ENCOUNTER — Telehealth: Payer: Self-pay

## 2022-07-08 NOTE — Progress Notes (Signed)
Chronic Care Management Pharmacy Assistant   Name: [name]  MRN: [id number] DOB: 1956-01-03  Reason for Encounter: CCM (Diabetes Disease State)  Recent office visits:  None since last CCM contact  Recent consult visits:  06/18/22 [name], DPT ([facility]) Lumbar radiculopathy No other information 06/07/22 [name] [facility]) History of right total knee replacement No other information 06/04/22 [name], LPTA ([facility]) History of right total knee replacement No other information 06/03/22 Vickie Lateef, MD (Nephrology) DM No med changes FU 4 months 05/31/22 [name] [facility]) History of right total knee replacement No other information  Hospital visits:  Medication Reconciliation was completed by comparing discharge summary, patient's EMR and Pharmacy list, and upon discussion with patient.  Admitted to the hospital on 06/11/22 due to Sciatica of left side. Discharge date was 06/11/2022. Discharged from [facility].    Medication Changes at Hospital Discharge: -Changed methocarbamol (ROBAXIN) 500 MG tablet  Medications that remain the same after Hospital Discharge:??  -All other medications will remain the same.    Medications: Outpatient Encounter Medications as of 07/08/2022  Medication Sig Note   acetaminophen (TYLENOL) 500 MG tablet Take 2 tablets (1,000 mg total) by mouth every 6 (six) hours as needed for moderate pain or headache.    amiodarone (PACERONE) 200 MG tablet TAKE 1/2 TABLET BY MOUTH EVERY OTHER DAY    aspirin EC 81 MG EC tablet Take 1 tablet (81 mg total) by mouth daily. Swallow whole.    atorvastatin (LIPITOR) 10 MG tablet TAKE 1 TABLET BY MOUTH ONCE DAILY    calcitRIOL (ROCALTROL) 0.5 MCG capsule Take 0.5 mcg by mouth every morning.    Calcium Carb-Cholecalciferol (CALCIUM CARBONATE-VITAMIN D3) 600-400 MG-UNIT TABS Take 1 tablet by mouth 2 (two) times a day.    carvedilol (COREG) 3.125 MG tablet TAKE 1  TABLET BY MOUTH TWICE (2) DAILY WITH A MEAL    clopidogrel (PLAVIX) 75 MG tablet TAKE 1 TABLET BY MOUTH ONCE A DAY    Continuous Blood Gluc Receiver (FREESTYLE LIBRE 2 READER) DEVI Use to check sugar multiple times per day as directed.  H/o DM2, treated with insulin.    Continuous Blood Gluc Receiver (FREESTYLE LIBRE 2 READER) DEVI FreeStyle Libre 2 Reader    docusate sodium (COLACE) 100 MG capsule Take 100 mg by mouth daily.    DULoxetine (CYMBALTA) 30 MG capsule Take 1 capsule (30 mg total) by mouth at bedtime.    gabapentin (NEURONTIN) 300 MG capsule Take 300 mg by mouth 3 (three) times daily.    insulin glargine (LANTUS) 100 UNIT/ML injection Inject 18 Units into the skin at bedtime. 05/07/2022: 8 units sq   Insulin Pen Needle (PEN NEEDLES) 32G X 6 MM MISC 1 each by Does not apply route 4 (four) times daily.    isosorbide mononitrate (IMDUR) 30 MG 24 hr tablet TAKE 1 TABLET BY MOUTH ONCE DAILY    levothyroxine (SYNTHROID) 50 MCG tablet TAKE 1 TABLET BY MOUTH ONCE DAILY BEFOREBREAKFAST    losartan (COZAAR) 25 MG tablet TAKE 1/2 TABLET BY MOUTH DAILY    methocarbamol (ROBAXIN) 500 MG tablet Take 1 tablet (500 mg total) by mouth 2 (two) times daily.    Multiple Vitamin (MULTIVITAMIN WITH MINERALS) TABS tablet Take 1 tablet by mouth every morning.    nitroGLYCERIN (NITROSTAT) 0.4 MG SL tablet Place 1 tablet (0.4 mg total) under the tongue every 5 (five) minutes as needed for chest pain.    NOVOLOG FLEXPEN 100 UNIT/ML FlexPen INJECT  0.05 ML (5 UNITS TOTAL) INTO THE SKIN 3 TIMES DAILY BEFORE MEALS    Omega-3 Fatty Acids (FISH OIL) 1000 MG CAPS Take 1,000 mg by mouth 2 (two) times a day.    ONETOUCH ULTRA test strip USE AS INSTRUCTED TO CHECK BLOOD SUGAR 3TIMES DAILY    oxyCODONE (OXY IR/ROXICODONE) 5 MG immediate release tablet Take 1 tablet (5 mg total) by mouth every 4 (four) hours as needed for severe pain.    polyethylene glycol (MIRALAX / GLYCOLAX) 17 g packet Take 17 g by mouth daily as needed  for mild constipation.    torsemide (DEMADEX) 20 MG tablet Take 1 tablet (20 mg total) by mouth 2 (two) times daily.    traMADol (ULTRAM) 50 MG tablet Take 1-2 tablets (50-100 mg total) by mouth at bedtime as needed (don't take with oxycodone.).    No facility-administered encounter medications on file as of 07/08/2022.    Recent Relevant Labs: Lab Results  Component Value Date/Time   HGBA1C 6.4 (A) 04/23/2022 04:10 PM   HGBA1C 7.7 (H) 01/15/2022 03:49 PM   HGBA1C 8.1 (H) 10/03/2021 11:37 AM   MICROALBUR 6.9 08/06/2016 02:32 PM   MICROALBUR 10.0 12/12/2015 03:48 PM    Kidney Function Lab Results  Component Value Date/Time   CREATININE 2.08 (H) 05/08/2022 02:40 AM   CREATININE 2.17 (H) 04/10/2022 11:55 AM   CREATININE 1.51 (H) 08/06/2016 02:31 PM   CREATININE 1.53 (H) 04/16/2016 04:50 PM   GFR 23.72 (L) 01/30/2022 12:43 PM   GFRNONAA 25 (L) 05/08/2022 02:40 AM   GFRNONAA 46 (L) 12/21/2013 11:32 AM   GFRAA 23 (L) 11/05/2019 01:27 PM   GFRAA 53 (L) 12/21/2013 11:32 AM   Contacted patient on 07/08/2022 to discuss diabetes disease state.   Current antihyperglycemic regimen:  Lantus 18 units daily HS  Novolog 5-10 units TID per sliding scale - usually BID Freestyle Libre 2 (w/ reader)    Patient verbally confirms she is taking the above medications as directed. Yes  What diet changes have been made to improve diabetes control?  What recent interventions/DTPs have been made to improve glycemic control:  No recent intervention  Have there been any recent hospitalizations or ED visits since last visit with CPP? Yes  Patient denies hypoglycemic symptoms, including Pale, Sweaty, Shaky, Hungry, Nervous/irritable, and Vision changes  Patient denies hyperglycemic symptoms, including blurry vision, excessive thirst, fatigue, polyuria, and weakness  Patient is not wearing her [employer]. Patient is sticking her finger; three times a day. Patient will [facility] back [facility] (07/08/2022). AM readings are fasting. She feels her sugar readings are high due to Prednisone tablets. She finished her last tablet Friday 07/05/2022.  Date  AM PM - 3:00    Bedtime - 11:00 07/08/22 132 07/07/22 162 141     389 - Took 24 units Lantus 07/05/22 99 209 - Took 8 units Novolog  284 - Took 24 units Lantus  07/04/22 178 198 - Took 8 units Novolog  167 07/03/22 182 294 - Took 8 units Novolog  143 07/02/22 193 330 - Took 10 units Novolog  224 - Took 24 units Lantus 07/01/22 171 265 - Took 8 units Novolog  290 - Took 24 units Lantus 06/30/22 174 407- Took 10 units Novolog  335 - Took 26 units Lantus  During the week, how often does your blood glucose drop below 70? Never  Are you checking your feet daily/regularly? Yes  Adherence Review: Is the patient currently  on a STATIN medication? Yes Is the patient currently on ACE/ARB medication? Yes Does the patient have >5 day gap between last estimated fill dates? No  Care Gaps: Annual wellness visit in last year? No Most recent A1C reading: 6.4 on 04/23/2022 Most Recent BP reading: 132/61 on 06/11/2022  Last eye exam / retinopathy screening: 03/09/2018 Last diabetic foot exam: Up to date  Summary of recommendations from last CCM Pharmacy visit (Date:06/06/22  Summary: CCM F/U call -DM: A1c 6.4% (04/2022); per Freestyle Libre avg glucose 153 (7-days). She has been off Ozempic since May due to high cost in donut hole, sugars seems to be well controlled even without it. Can consider PAP in future if it needs to be restarted. -Pt reports vast improvement in knee pain since TKA 05/07/22   Recommendations: -No med changes   Follow up [place] will call patient 3 month for DM update -Pharmacist follow up televisit scheduled for 6 months -PCP F/U due Nov 2023 (A1c)  Star Rating Drugs: Medication:                Last Fill:         Day Supply Atorvastatin 10 mg      06/18/22          30  Losartan 25 mg            06/24/22          90 Fill dates verified with [place] appointment on 12/06/2022  Charlene Brooke, CPP notified  Marijean Niemann, [place] Assistant [phone]

## 2022-07-09 ENCOUNTER — Ambulatory Visit: Payer: HMO | Attending: Internal Medicine | Admitting: Internal Medicine

## 2022-07-09 ENCOUNTER — Other Ambulatory Visit (HOSPITAL_COMMUNITY): Payer: Self-pay | Admitting: Internal Medicine

## 2022-07-09 ENCOUNTER — Encounter: Payer: Self-pay | Admitting: Internal Medicine

## 2022-07-09 VITALS — BP 100/53 | HR 62 | Ht 68.0 in | Wt 268.0 lb

## 2022-07-09 DIAGNOSIS — I428 Other cardiomyopathies: Secondary | ICD-10-CM | POA: Diagnosis not present

## 2022-07-09 DIAGNOSIS — Z79899 Other long term (current) drug therapy: Secondary | ICD-10-CM

## 2022-07-09 DIAGNOSIS — I493 Ventricular premature depolarization: Secondary | ICD-10-CM

## 2022-07-09 DIAGNOSIS — Z95810 Presence of automatic (implantable) cardiac defibrillator: Secondary | ICD-10-CM

## 2022-07-09 DIAGNOSIS — I5022 Chronic systolic (congestive) heart failure: Secondary | ICD-10-CM

## 2022-07-09 NOTE — Progress Notes (Signed)
Patient ID: [name], female   DOB: 1956/01/19, 66 y.o.   MRN: [id number]      Patient Care Team: [name], MD as PCP - General (Family Medicine) Minna Merritts, MD as PCP - Cardiology (Cardiology) Deboraha Sprang, MD as PCP - Electrophysiology (Cardiology) Bensimhon, Shaune Pascal, MD as PCP - Advanced Heart Failure (Cardiology) Calvert Cantor, MD as Consulting Physician (Ophthalmology) Lavonia Dana, MD as Consulting Physician (Internal Medicine) Elvina Mattes, Adele Schilder (Inactive) as Attending Physician (Podiatry) Tsosie Billing, MD as Consulting Physician (Infectious Diseases) Alda Berthold, DO as Consulting Physician (Neurology) [name], [facility] as Pharmacist (Pharmacist) Earlie Server, MD as Consulting Physician (Oncology)   HPI  Shaneisha Burkel Diers is a 66 y.o. female CRT-D implanted for nonischemic cardiomyopathy with concomitant coronary disease.. Developed recurrent MSSA bacteremia and underwent system extraction 3/20 with aspiration of the thrombus of the lead.  Recurrent heart failure and interval worsening of LV function prompted decision to reimplant done [facility]) 6/20  Following reimplantation LV function has improved considerably (see below)  History of atrial tachycardia and PVCs  for which she has been treated with low dose amiodarone. Patient denies symptoms of respiratory, GI intolerance, sun sensitivity, neurological symptoms attributable to amiodarone.      The patient denies chest pain, nocturnal dyspnea, orthopnea.  There have been no palpitations, lightheadedness or syncope.  Complains of limited exercise capacity.  Had knee surgery with resolution of her knee pain; now has had back problems which have been limiting.  Marland Kitchen     DATE TEST EF%    9/15  echo  30 %    1 /17 echo  50 %    12/19 Echo  45-50%    12/19 TEE   No vegetations  12/19 LHC   pLAD80 with ISR; mLAD70 pCx 80;D1 90 mRCA 40  2/20 CT     2/20 TEE    3/20  (2) Echo  40%    6/20 Echo  10-15%   12/20 Echo 35%   6/21 Echo  50-55%   2/23 Echo  50-55%     Date Cr K Hgb TSH LFTs   LDL  3/20 3.54>>1.18  11 4.64  18   7/20 2.15 4.9 9.7 (6/20)17.4    18   9/20  2.02 4.3  21.4  55  11/20 2.17 4.7 11.2 6.17 23   1/21 2.44 4.9      12/21 2.09 5.1 11.6     6/22  (3/22)2.48 1.58 (3/22)4.7 4.2 (3/22)13.0 10.8     2/23 2.15<<1.87   1.12  43  8/23 2.08 5.1 9.4  15     Past Medical History:  Diagnosis Date   Acute myocardial infarction, subendocardial infarction, initial episode of care ([place]) 07/21/2012   Acute osteomyelitis involving ankle and foot ([place]) 59/16/3846   Acute systolic heart failure ([place]) 07/21/2012   New onset 07/19/12; admission to [facility] ED. Elevated Troponins.  S/p 2D-echo with EF 20-25%.  S/p cardiac catheterization with stenting LAD.  Repeat 2D-echo 10/2011 with improved EF of 35%.    Anemia    Arthritis    Automatic implantable cardioverter-defibrillator in situ    a. MDT CRT-D 06/2014, SN: [id number]  .removed in feb 2020   CAD (coronary artery disease)    a. cardiac cath 101/04/2012: PCI/DES to chronically occluded mLAD, consideration PCI to diag branch in 4 weeks.    Cataract    Chicken pox    Chronic kidney disease  Chronic systolic CHF (congestive heart failure) (HCC)    a. mixed ICM & NICM; b. EF 20-25% by echo 07/2012, mid-dist 2/3 of LV sev HK/AK, mild MR. echo 10/2012: EF 30-35%, sev HK ant-septal & inf walls, GR1DD, mild MR, PASP 33. c. echo 02/2013: EF 30%, GR1DD, mild MR. echo 04/2014: EF 30%, Septal-lat dyssynchrony, global HK, inf AK, GR1DD, mild MR. d. echo 10/2014: EF50-55%, WM nl, GR1DD, septal mild paradox. e. echo 02/2015: EF 50-55%, wm    Depression    Heart attack ([place])    Heel ulcer ([place]) 04/27/2015   History of blood transfusion ~ 2011   "plasma; had neuropathy; couldn't walk"   Hypertension    Hypothyroidism    LBBB (left bundle branch block)    Neuromuscular disorder ([place])    Neuropathy 2011   Obesity,  unspecified    OSA on CPAP    Moderate with AHI 23/hr and now on CPAP at 16cm H2O.  Her DME is [facility]   Pure hypercholesterolemia    Sepsis ([place]) 09/2018   Type II diabetes mellitus ([place])    Unspecified vitamin D deficiency    Past Surgical History:  Procedure Laterality Date   ABDOMINAL AORTOGRAM W/LOWER EXTREMITY N/A 02/02/2021   Procedure: ABDOMINAL AORTOGRAM W/LOWER EXTREMITY;  Surgeon: Angelia Mould, MD;  Location: [place] [facility];  Service: Cardiovascular;  Laterality: N/A;   AMPUTATION Left 03/02/2021   Procedure: AMPUTATION RAY, left great toe;  Surgeon: Trula Slade, DPM;  Location: [place];  Service: Podiatry;  Laterality: Left;   AMPUTATION TOE Right 06/18/2015   Procedure: AMPUTATION TOE;  Surgeon: Samara Deist, DPM;  Location: [facility];  Service: Podiatry;  Laterality: Right;   AMPUTATION TOE Left 10/08/2018   Procedure: AMPUTATION TOE LEFT 2ND;  Surgeon: Albertine Patricia, DPM;  Location: [facility];  Service: Podiatry;  Laterality: Left;   BACK SURGERY     BI-VENTRICULAR IMPLANTABLE CARDIOVERTER DEFIBRILLATOR N/A 07/06/2014   Procedure: BI-VENTRICULAR IMPLANTABLE CARDIOVERTER DEFIBRILLATOR  (CRT-D);  Surgeon: Deboraha Sprang, MD;  Location: [facility] CATH LAB;  Service: Cardiovascular;  Laterality: N/A;   BI-VENTRICULAR IMPLANTABLE CARDIOVERTER DEFIBRILLATOR  (CRT-D)  07/06/2014   BILATERAL OOPHORECTOMY  01/2011   ovarian cyst benign   BIV ICD INSERTION CRT-D N/A 03/31/2019   Procedure: BIV ICD INSERTION CRT-D;  Surgeon: Constance Haw, MD;  Location: [place] CV LAB;  Service: Cardiovascular;  Laterality: N/A;   COLONOSCOPY WITH PROPOFOL Left 02/22/2015   Procedure: COLONOSCOPY WITH PROPOFOL;  Surgeon: Hulen Luster, MD;  Location: [facility] ENDOSCOPY;  Service: Endoscopy;  Laterality: Left;   CORONARY ANGIOPLASTY WITH STENT PLACEMENT Left 07/2012   new onset systolic CHF; elevated troponins.  Cardiac catheterization with stenting to LAD; EF 15%.  2D-echo: EF 20-25%.    ESOPHAGOGASTRODUODENOSCOPY N/A 02/22/2015   Procedure: ESOPHAGOGASTRODUODENOSCOPY (EGD);  Surgeon: Hulen Luster, MD;  Location: [facility] ENDOSCOPY;  Service: Endoscopy;  Laterality: N/A;   I & D EXTREMITY Left 11/28/2020   Procedure: IRRIGATION AND DEBRIDEMENT OF FOOT;  Surgeon: Trula Slade, DPM;  Location: [place];  Service: Podiatry;  Laterality: Left;   I & D EXTREMITY Left 01/31/2021   Procedure: IRRIGATION AND DEBRIDEMENT EXTREMITY OF LEFT FOOT WITH BONE BIOPSY OF  1ST METATARSAL AND FIFTH METATARSAL;  Surgeon: Felipa Furnace, DPM;  Location: [place];  Service: Podiatry;  Laterality: Left;   I & D EXTREMITY Left 02/03/2021   Procedure: IRRIGATION AND DEBRIDEMENT EXTREMITY WITH INTERNAL AMPUTATION FIRST AND FIFTH RAY;  Surgeon: Posey Pronto,  Thomasene Lot, DPM;  Location: [place];  Service: Podiatry;  Laterality: Left;   INCISION AND DRAINAGE ABSCESS Right 2007   groin; with ICU stay due to sepsis.   IR FLUORO GUIDE CV LINE RIGHT  03/06/2021   IR REMOVAL TUN CV CATH W/O FL  04/27/2021   IR US GUIDE VASC ACCESS RIGHT  03/06/2021   IRRIGATION AND DEBRIDEMENT FOOT Left 04/25/2021   Procedure: EXCISION AND DEBRIDEMENT WOUND ULCER LEFT FOOT; METATARSECTOMY;  Surgeon: Trula Slade, DPM;  Location: [facility];  Service: Podiatry;  Laterality: Left;   LAPAROSCOPIC CHOLECYSTECTOMY  2011   LEFT HEART CATH AND CORONARY ANGIOGRAPHY N/A 10/05/2018   Procedure: LEFT HEART CATH AND CORONARY ANGIOGRAPHY;  Surgeon: Minna Merritts, MD;  Location: [place] CV LAB;  Service: Cardiovascular;  Laterality: N/A;   LEFT HEART CATHETERIZATION WITH CORONARY ANGIOGRAM N/A 07/21/2012   Procedure: LEFT HEART CATHETERIZATION WITH CORONARY ANGIOGRAM;  Surgeon: Jolaine Artist, MD;  Location: [facility] CATH LAB;  Service: Cardiovascular;  Laterality: N/A;   LUMBAR [place]   L4-5   PACEMAKER REMOVAL  11/2018   due to infection around pacemaker   [place] (PCI-S) N/A 07/23/2012   Procedure:  PERCUTANEOUS CORONARY STENT INTERVENTION (PCI-S);  Surgeon: Sherren Mocha, MD;  Location: [facility] CATH LAB;  Service: Cardiovascular;  Laterality: N/A;   PERIPHERAL VASCULAR BALLOON ANGIOPLASTY Left 10/06/2018   Procedure: PERIPHERAL VASCULAR BALLOON ANGIOPLASTY;  Surgeon: Katha Cabal, MD;  Location: [place] CV LAB;  Service: Cardiovascular;  Laterality: Left;   PERIPHERAL VASCULAR CATHETERIZATION N/A 02/10/2015   Procedure: Picc Line Insertion;  Surgeon: Katha Cabal, MD;  Location: [place] CV LAB;  Service: Cardiovascular;  Laterality: N/A;   RIGHT HEART CATH N/A 03/29/2019   Procedure: RIGHT HEART CATH;  Surgeon: Jolaine Artist, MD;  Location: [place] CV LAB;  Service: Cardiovascular;  Laterality: N/A;   TEE WITHOUT CARDIOVERSION N/A 10/02/2018   Procedure: TRANSESOPHAGEAL ECHOCARDIOGRAM (TEE);  Surgeon: Wellington Hampshire, MD;  Location: [facility];  Service: Cardiovascular;  Laterality: N/A;   TOTAL KNEE ARTHROPLASTY Right 05/07/2022   Procedure: TOTAL KNEE ARTHROPLASTY;  Surgeon: Paralee Cancel, MD;  Location: [facility];  Service: Orthopedics;  Laterality: Right;   TUBAL LIGATION  1981   VAGINAL HYSTERECTOMY  01/2011   Fibroids/DUB.  Ovaries removed. [place].   Current Meds  Medication Sig   acetaminophen (TYLENOL) 500 MG tablet Take 2 tablets (1,000 mg total) by mouth every 6 (six) hours as needed for moderate pain or headache.   amiodarone (PACERONE) 200 MG tablet TAKE 1/2 TABLET BY MOUTH EVERY OTHER DAY   atorvastatin (LIPITOR) 10 MG tablet TAKE 1 TABLET BY MOUTH ONCE DAILY   calcitRIOL (ROCALTROL) 0.5 MCG capsule Take 0.5 mcg by mouth every morning.   Calcium Carb-Cholecalciferol (CALCIUM CARBONATE-VITAMIN D3) 600-400 MG-UNIT TABS Take 1 tablet by mouth 2 (two) times a day.   carvedilol (COREG) 3.125 MG tablet TAKE 1 TABLET BY MOUTH TWICE (2) DAILY WITH A MEAL   clopidogrel (PLAVIX) 75 MG tablet TAKE 1 TABLET BY MOUTH ONCE A DAY   Continuous Blood Gluc Receiver  (FREESTYLE LIBRE 2 READER) DEVI Use to check sugar multiple times per day as directed.  H/o DM2, treated with insulin.   Continuous Blood Gluc Receiver (FREESTYLE LIBRE 2 READER) DEVI FreeStyle Libre 2 Reader   DULoxetine (CYMBALTA) 30 MG capsule Take 1 capsule (30 mg total) by mouth at bedtime.   gabapentin (NEURONTIN) 300 MG capsule Take  300 mg by mouth 3 (three) times daily.   insulin glargine (LANTUS) 100 UNIT/ML injection Inject 18 Units into the skin at bedtime.   Insulin Pen Needle (PEN NEEDLES) 32G X 6 MM MISC 1 each by Does not apply route 4 (four) times daily.   isosorbide mononitrate (IMDUR) 30 MG 24 hr tablet TAKE 1 TABLET BY MOUTH ONCE DAILY   levothyroxine (SYNTHROID) 50 MCG tablet TAKE 1 TABLET BY MOUTH ONCE DAILY BEFOREBREAKFAST   losartan (COZAAR) 25 MG tablet TAKE 1/2 TABLET BY MOUTH DAILY   methocarbamol (ROBAXIN) 500 MG tablet Take 1 tablet (500 mg total) by mouth 2 (two) times daily.   Multiple Vitamin (MULTIVITAMIN WITH MINERALS) TABS tablet Take 1 tablet by mouth every morning.   nitroGLYCERIN (NITROSTAT) 0.4 MG SL tablet Place 0.4 mg under the tongue every 5 (five) minutes as needed for chest pain.   NOVOLOG FLEXPEN 100 UNIT/ML FlexPen INJECT 0.05 ML (5 UNITS TOTAL) INTO THE SKIN 3 TIMES DAILY BEFORE MEALS   Omega-3 Fatty Acids (FISH OIL) 1000 MG CAPS Take 1,000 mg by mouth 2 (two) times a day.   ONETOUCH ULTRA test strip USE AS INSTRUCTED TO CHECK BLOOD SUGAR 3TIMES DAILY   polyethylene glycol (MIRALAX / GLYCOLAX) 17 g packet Take 17 g by mouth daily as needed for mild constipation.   torsemide (DEMADEX) 20 MG tablet Take 1 tablet (20 mg total) by mouth 2 (two) times daily.   traMADol (ULTRAM) 50 MG tablet Take 1-2 tablets (50-100 mg total) by mouth at bedtime as needed (don't take with oxycodone.).   [DISCONTINUED] aspirin EC 81 MG EC tablet Take 1 tablet (81 mg total) by mouth daily. Swallow whole.   Allergies  Allergen Reactions   Heparin Other (See Comments)    Can  not remember reaction   Nsaids Other (See Comments)    Kidney disease   Review of Systems negative except from HPI and PMH  Physical Exam: BP (!) 100/53   Pulse 62   Ht '5\' 8"'$  (1.727 m)   Wt 268 lb (121.6 kg)   SpO2 98%   BMI 40.75 kg/m  Well developed and well nourished in no acute distress HENT normal Neck supple with JVP-flat Clear Device pocket well healed; without hematoma or erythema.  There is no tethering  Regular rate and rhythm, no  gallop No / murmur Abd-soft with active BS No Clubbing cyanosis 2+ L and none on R edema Skin-warm and dry A & Oriented  Grossly normal sensory and motor function  ECG sinus with P synchronous pacing at 62 Intervals-/16/47 Upright QRS lead V1 Left axis deviation 8/22 Sinus with P synchronous pacing with a negative QRS lead V1 and an upright QRS lead I>> Electrical resynchronization was successful  Review of the tracing from 11/21 showed a QR in lead I and RS in lead V1  Assessment and  Plan  CRT-D recent reimplantation   Nonischemic cardiomyopathy  Coronary artery disease  Peripheral vascular disease  Congestive heart failure- chronic-systolic class II  Renal insufficiency grade 3   Hypothyroidism  High Risk Medication Surveillance-Amiodarone   PVCs  Recent amputation lower extremity digits  Volume status is much improved.  Her left leg I think is not a clear marker.  Interestingly, her OptiVol plummeted about 3 weeks ago and she does not know why.  Make no changes in her diuretics at this time.  Her blood pressure is low but no lightheadedness.  We will continue the carvedilol losartan and the isosorbide.  I have reached out to Dr. Marjie Skiff and Audelia Acton for clarification as to her need for DAPT.  For right now we will hold off on her aspirin.  No interval ventricular tachycardia.  We will continue amiodarone but we will continue the dose at 100 mg daily.  Surveillance laboratories are in the range  Device  function is normal. Programming changes none  See Paceart for details

## 2022-07-09 NOTE — Patient Instructions (Addendum)
Medication Instructions:  - Your physician has recommended you make the following change in your medication:  1) STOP Aspirin  *If you need a refill on your cardiac medications before your next appointment, please call your pharmacy*   Lab Work: - none ordered  If you have labs (blood work) drawn today and your tests are completely normal, you will receive your results only by: [place] (if you have MyChart) OR A paper copy in the mail If you have any lab test that is abnormal or we need to change your treatment, we will call you to review the results.   Testing/Procedures: - none ordered   Follow-Up: At [facility], you and your health needs are our priority.  As part of our continuing mission to provide you with exceptional heart care, we have created designated Provider Care Teams.  These Care Teams include your primary Cardiologist (physician) and Advanced Practice Providers (APPs -  Physician Assistants and Nurse Practitioners) who all work together to provide you with the care you need, when you need it.  We recommend signing up for the patient portal called "MyChart".  Sign up information is provided on this After Visit Summary.  MyChart is used to connect with patients for Virtual Visits (Telemedicine).  Patients are able to view lab/test results, encounter notes, upcoming appointments, etc.  Non-urgent messages can be sent to your provider as well.   To learn more about what you can do with MyChart, go to [email or web address].    Your next appointment:   1 year(s)  The format for your next appointment:   In Person  Provider:   Virl Axe, MD    Other Instructions N/a  Important Information About Sugar

## 2022-07-12 DIAGNOSIS — J309 Allergic rhinitis, unspecified: Secondary | ICD-10-CM | POA: Diagnosis not present

## 2022-07-23 NOTE — Telephone Encounter (Signed)
Patient has been scheduled for an in-office appointment on 08/05/2022 at 2:15 for [facility] report.   Charlene Brooke, CPP notified  Marijean Niemann, [place] Clinical Pharmacy Assistant [phone]

## 2022-07-24 ENCOUNTER — Telehealth: Payer: Self-pay | Admitting: Family Medicine

## 2022-07-24 ENCOUNTER — Telehealth: Payer: Self-pay

## 2022-07-24 DIAGNOSIS — J309 Allergic rhinitis, unspecified: Secondary | ICD-10-CM | POA: Diagnosis not present

## 2022-07-24 NOTE — Telephone Encounter (Signed)
...     Pre-operative Risk Assessment    Patient Name: [name]  DOB: [birth year] MRN: [id number]      Request for Surgical Clearance    Procedure:   BILATERAL L3/L4/L5  MEDICAL BRANCH BLOCK INJECTION  Date of Surgery:  Clearance TBD                                 Surgeon:  DR [facility] Surgeon's Group or Practice Name:  [facility] Phone number:  [phone] Fax number:  [phone]   Type of Clearance Requested:   - Medical    Type of Anesthesia:   NOT INDICATION   Additional requests/questions:    Gwenlyn Found   07/24/2022, 12:10 PM

## 2022-07-24 NOTE — Telephone Encounter (Signed)
Please reschedule patient.

## 2022-07-24 NOTE — Telephone Encounter (Signed)
Patient would like a call to reschedule her 10/30 appointment with you.

## 2022-07-25 NOTE — Telephone Encounter (Signed)
[name] 66 year old female is requesting preoperative cardiac evaluation for L3/L4/L5 medical branch block injection.  Procedure is not yet been scheduled.   She has a PMH of CRT-D implanted for nonischemic cardiomyopathy, CAD, recurrent MSSA bacteremia, CHF, atrial tachycardia, PVCs.  She underwent cardiac catheterization 10/05/2018 which showed proximal LAD 40%, proximal LAD 90%, proximal LAD-mid LAD 80%, mid LAD-distal LAD 70%, first diagonal 90%, ostial first diagonal 80%, mid RCA 40%, mid RCA 40% lesions.   May her clopidogrel be held prior to her procedure?   Thank you for your help.  Please direct your response to [facility] preop pool.   Jossie Ng. Shaurya Rawdon NP-C     07/25/2022, 8:16 AM [place] [address] Office [phone] Fax [phone]

## 2022-07-29 ENCOUNTER — Other Ambulatory Visit: Payer: Self-pay | Admitting: Internal Medicine

## 2022-07-29 NOTE — Telephone Encounter (Signed)
Amy Manus Rudd has left a few voicemails for patient to reschedule appt.  If patient calls office again, please have her call Amy to reschedule: [phone]

## 2022-07-29 NOTE — Telephone Encounter (Signed)
Patient called and stated she hasn't heard anything back so she can get rescheduled.

## 2022-07-29 NOTE — Telephone Encounter (Signed)
     Primary Cardiologist: Virl Axe, MD  Chart reviewed as part of pre-operative protocol coverage. Given past medical history and time since last visit, based on ACC/AHA guidelines, ANILA BOJARSKI would be at acceptable risk for the planned procedure without further cardiovascular testing.   Her Plavix may be held for 5 days prior to her procedure.  Please resume as soon as hemostasis is achieved.  I will route this recommendation to the requesting party via Epic fax function and remove from pre-op pool.  Please call with questions.  Jossie Ng. Merrel Crabbe NP-C     07/29/2022, 9:16 AM [place] [address] Office [phone] Fax [phone]

## 2022-07-29 NOTE — Telephone Encounter (Signed)
Called patient to reschedule her appointment with Charlene Brooke on 08/05/2022. No answer; left message.  Charlene Brooke, CPP notified  [name], [place] Clinical Pharmacy Assistant [phone]

## 2022-07-31 ENCOUNTER — Telehealth: Payer: Self-pay

## 2022-07-31 NOTE — Telephone Encounter (Signed)
Spoke with patient. Rescheduled for 11/2 @ 2:15

## 2022-07-31 NOTE — Progress Notes (Signed)
Chronic Care Management Pharmacy Assistant   Name: [name]  MRN: [id number] DOB: [birth year]  Reason for Encounter: CCM (Appointment Reminder)  Medications: Outpatient Encounter Medications as of 07/31/2022  Medication Sig   acetaminophen (TYLENOL) 500 MG tablet Take 2 tablets (1,000 mg total) by mouth every 6 (six) hours as needed for moderate pain or headache.   amiodarone (PACERONE) 200 MG tablet TAKE 1/2 TABLET BY MOUTH EVERY OTHER DAY   atorvastatin (LIPITOR) 10 MG tablet TAKE 1 TABLET BY MOUTH ONCE DAILY   calcitRIOL (ROCALTROL) 0.5 MCG capsule Take 0.5 mcg by mouth every morning.   Calcium Carb-Cholecalciferol (CALCIUM CARBONATE-VITAMIN D3) 600-400 MG-UNIT TABS Take 1 tablet by mouth 2 (two) times a day.   carvedilol (COREG) 3.125 MG tablet TAKE 1 TABLET BY MOUTH TWICE (2) DAILY WITH A MEAL   clopidogrel (PLAVIX) 75 MG tablet TAKE 1 TABLET BY MOUTH ONCE A DAY   Continuous Blood Gluc Receiver (FREESTYLE LIBRE 2 READER) DEVI Use to check sugar multiple times per day as directed.  H/o DM2, treated with insulin.   Continuous Blood Gluc Receiver (FREESTYLE LIBRE 2 READER) DEVI FreeStyle Libre 2 Reader   DULoxetine (CYMBALTA) 30 MG capsule Take 1 capsule (30 mg total) by mouth at bedtime.   gabapentin (NEURONTIN) 300 MG capsule Take 300 mg by mouth 3 (three) times daily.   insulin glargine (LANTUS) 100 UNIT/ML injection Inject 18 Units into the skin at bedtime.   Insulin Pen Needle (PEN NEEDLES) 32G X 6 MM MISC 1 each by Does not apply route 4 (four) times daily.   isosorbide mononitrate (IMDUR) 30 MG 24 hr tablet TAKE 1 TABLET BY MOUTH ONCE DAILY   levothyroxine (SYNTHROID) 50 MCG tablet TAKE 1 TABLET BY MOUTH ONCE DAILY BEFOREBREAKFAST   losartan (COZAAR) 25 MG tablet TAKE 1/2 TABLET BY MOUTH DAILY   methocarbamol (ROBAXIN) 500 MG tablet Take 1 tablet (500 mg total) by mouth 2 (two) times daily.   Multiple Vitamin (MULTIVITAMIN WITH MINERALS) TABS tablet Take 1 tablet by  mouth every morning.   nitroGLYCERIN (NITROSTAT) 0.4 MG SL tablet Place 0.4 mg under the tongue every 5 (five) minutes as needed for chest pain.   NOVOLOG FLEXPEN 100 UNIT/ML FlexPen INJECT 0.05 ML (5 UNITS TOTAL) INTO THE SKIN 3 TIMES DAILY BEFORE MEALS   Omega-3 Fatty Acids (FISH OIL) 1000 MG CAPS Take 1,000 mg by mouth 2 (two) times a day.   ONETOUCH ULTRA test strip USE AS INSTRUCTED TO CHECK BLOOD SUGAR 3TIMES DAILY   oxyCODONE (OXY IR/ROXICODONE) 5 MG immediate release tablet Take 1 tablet (5 mg total) by mouth every 4 (four) hours as needed for severe pain. (Patient not taking: Reported on 07/09/2022)   polyethylene glycol (MIRALAX / GLYCOLAX) 17 g packet Take 17 g by mouth daily as needed for mild constipation.   torsemide (DEMADEX) 20 MG tablet Take 1 tablet (20 mg total) by mouth 2 (two) times daily.   traMADol (ULTRAM) 50 MG tablet Take 1-2 tablets (50-100 mg total) by mouth at bedtime as needed (don't take with oxycodone.).   No facility-administered encounter medications on file as of 07/31/2022.   [name] was contacted to remind of upcoming office visit with [name] on 08/08/2022 at 2:15. Patient was reminded to have any blood glucose and blood pressure readings available for review at appointment.   Message was left reminding patient of appointment.  CCM referral has been placed prior to visit?  No   Star Rating  Drugs: Medication:                Last Fill:         Day Supply Atorvastatin 10 mg     07/11/22          30  Losartan 25 mg           06/24/22          90 Verified with [place], CPP notified  Marijean Niemann, [place] Pharmacy Assistant [phone]

## 2022-08-05 ENCOUNTER — Ambulatory Visit (INDEPENDENT_AMBULATORY_CARE_PROVIDER_SITE_OTHER): Payer: HMO

## 2022-08-05 ENCOUNTER — Ambulatory Visit: Payer: HMO

## 2022-08-05 ENCOUNTER — Encounter: Payer: Self-pay | Admitting: Neurology

## 2022-08-05 DIAGNOSIS — I5022 Chronic systolic (congestive) heart failure: Secondary | ICD-10-CM

## 2022-08-05 LAB — CUP PACEART REMOTE DEVICE CHECK
Battery Remaining Longevity: 46 mo
Battery Voltage: 2.97 V
Brady Statistic AP VP Percent: 3.84 %
Brady Statistic AP VS Percent: 0.02 %
Brady Statistic AS VP Percent: 95.98 %
Brady Statistic AS VS Percent: 0.16 %
Brady Statistic RA Percent Paced: 3.8 %
Brady Statistic RV Percent Paced: 98.02 %
Date Time Interrogation Session: 20231030033422
HighPow Impedance: 54 Ohm
Implantable Lead Connection Status: 753985
Implantable Lead Connection Status: 753985
Implantable Lead Connection Status: 753985
Implantable Lead Implant Date: 20200625
Implantable Lead Implant Date: 20200625
Implantable Lead Implant Date: 20200625
Implantable Lead Location: 753858
Implantable Lead Location: 753859
Implantable Lead Location: 753860
Implantable Lead Model: 4398
Implantable Lead Model: 5076
Implantable Lead Model: 6935
Implantable Pulse Generator Implant Date: 20200625
Lead Channel Impedance Value: 1026 Ohm
Lead Channel Impedance Value: 1064 Ohm
Lead Channel Impedance Value: 178.125
Lead Channel Impedance Value: 183.214
Lead Channel Impedance Value: 197.755
Lead Channel Impedance Value: 273.729
Lead Channel Impedance Value: 285 Ohm
Lead Channel Impedance Value: 285.934
Lead Channel Impedance Value: 361 Ohm
Lead Channel Impedance Value: 361 Ohm
Lead Channel Impedance Value: 456 Ohm
Lead Channel Impedance Value: 475 Ohm
Lead Channel Impedance Value: 513 Ohm
Lead Channel Impedance Value: 646 Ohm
Lead Channel Impedance Value: 665 Ohm
Lead Channel Impedance Value: 703 Ohm
Lead Channel Impedance Value: 722 Ohm
Lead Channel Impedance Value: 893 Ohm
Lead Channel Pacing Threshold Amplitude: 0.625 V
Lead Channel Pacing Threshold Amplitude: 0.625 V
Lead Channel Pacing Threshold Amplitude: 2.125 V
Lead Channel Pacing Threshold Pulse Width: 0.4 ms
Lead Channel Pacing Threshold Pulse Width: 0.4 ms
Lead Channel Pacing Threshold Pulse Width: 0.8 ms
Lead Channel Sensing Intrinsic Amplitude: 0.875 mV
Lead Channel Sensing Intrinsic Amplitude: 0.875 mV
Lead Channel Sensing Intrinsic Amplitude: 18.25 mV
Lead Channel Sensing Intrinsic Amplitude: 18.25 mV
Lead Channel Setting Pacing Amplitude: 1.75 V
Lead Channel Setting Pacing Amplitude: 2 V
Lead Channel Setting Pacing Amplitude: 2.5 V
Lead Channel Setting Pacing Pulse Width: 0.4 ms
Lead Channel Setting Pacing Pulse Width: 0.8 ms
Lead Channel Setting Sensing Sensitivity: 0.3 mV
Zone Setting Status: 755011
Zone Setting Status: 755011

## 2022-08-08 ENCOUNTER — Ambulatory Visit (INDEPENDENT_AMBULATORY_CARE_PROVIDER_SITE_OTHER): Payer: HMO | Admitting: Pharmacist

## 2022-08-08 DIAGNOSIS — E1142 Type 2 diabetes mellitus with diabetic polyneuropathy: Secondary | ICD-10-CM

## 2022-08-08 DIAGNOSIS — Z23 Encounter for immunization: Secondary | ICD-10-CM | POA: Diagnosis not present

## 2022-08-08 DIAGNOSIS — I480 Paroxysmal atrial fibrillation: Secondary | ICD-10-CM

## 2022-08-08 DIAGNOSIS — I10 Essential (primary) hypertension: Secondary | ICD-10-CM

## 2022-08-08 DIAGNOSIS — I2510 Atherosclerotic heart disease of native coronary artery without angina pectoris: Secondary | ICD-10-CM

## 2022-08-08 DIAGNOSIS — I5032 Chronic diastolic (congestive) heart failure: Secondary | ICD-10-CM

## 2022-08-08 NOTE — Patient Instructions (Addendum)
Thank you for meeting with me to discuss your medications! I look forward to working with you to achieve your health care goals. Below is a summary of what we talked about during the visit:  Decrease your Lantus to 15 units. Take this every night. Continue Novolog 5-10 units with meals.  We will work on getting [place] approved through [employer]   Visit Information  Phone number for Pharmacist: [phone]   Goals Addressed   None     Care Plan : [place]  Updates made by [name], RPH since 08/12/2022 12:00 AM     Problem: Hypertension, Hyperlipidemia, Diabetes, Atrial Fibrillation, Heart Failure, Coronary Artery Disease, and Chronic Kidney Disease   Priority: High     Long-Range Goal: Disease management   Start Date: 06/29/2021  Expected End Date: 12/05/2022  This Visit's Progress: On track  Recent Progress: On track  Priority: High  Note:   Current Barriers:  Unable to maintain control of DM, CKD   Pharmacist Clinical Goal(s):  Patient will adhere to plan to optimize therapeutic regimen for DM, HF as evidenced by report of adherence to recommended medication management changes through collaboration with PharmD and provider.    Interventions: 1:1 collaboration with [name], MD regarding development and update of comprehensive plan of care as evidenced by provider attestation and co-signature Inter-disciplinary care team collaboration (see longitudinal plan of care) Comprehensive medication review performed; medication list updated in electronic medical record   Hyperlipidemia: (LDL goal < 70) -Controlled - LDL 43 (09/2021) at goal; pt reports compliance with medications as prescribed; she endorses some bleeding issues - when she nicks herself it takes longer to stop bleeding, she denies blood in urine/stool -Hx CAD - MI 07/2012 w/ stent; Hx stroke -Current treatment: Atorvastatin 10 mg daily - Appropriate, Effective, Safe,  Accessible Isosorbide MN 30 mg daily - Appropriate, Effective, Safe, Accessible Nitroglycerin 0.4 mg SL prn - Appropriate, Effective, Safe, Accessible Clopidogrel 75 mg daily HS - Appropriate, Effective, Safe, Accessible Aspirin 81 mg daily - Appropriate, Effective, Safe, Accessible -Educated on Cholesterol goals; Benefits of statin for ASCVD risk reduction; -Counseled on benefits of clopidogrel/aspirin for stent patency; discussed nuisance bleeding vs major bleeding; counseled on signs/symptoms of major bleeding and when to seek emergency care -Recommended to continue current medication   Diabetes (A1c goal <7%) -Controlled - A1c 6.4% (04/2022) at goal; pt reports she adjusts PM Lantus dose based on PM glucose reading; she has been taking up to 8-10 units of Novolog -Pt eats 2 meals per day -Pt reports rare hypoglycemia; she uses peanut butter for hypoglycemic episodes -Hx toe amputation, osteomyelitis -Home BG (via [facility] - uses reader) Reviewed AGP report: 07/26/22 to 08/08/22. Sensor active: 64%  Time in range (70-180): 67% (goal > 70%)  High (180-250): 20%  Very high (>250): 7%  Low (< 70): 6% (goal < 4%)  GMI: 6.9%; Average glucose: 149  -Current medications: Lantus 18 units daily HS -Appropriate, Effective, Query Safe Novolog 5-10 units TID per sliding scale - usually BID -Appropriate, Effective, Safe, Accessible Freestyle Libre 2 (w/ reader) - Appropriate, Effective, Safe, Accessible -Medications previously tried: Trulicity, glimepiride, Jardiance, metformin, Januvia, Ozempic (cost in donut hole), Farxiga (low GFR) -Reviewed CGM report: lows are occurring between 4am-11am; she is taking Lantus at night -Recommend to reduce Lantus to 15 units daily -Recommend to restart Ozempic - pursue PAP. Hopefully can d/c Novolog when starting Ozempic.   Atrial [place]/NSVT/AFIB (Goal: prevent stroke and major  bleeding) -Controlled - pt reports palpitations have improved since  being on amiodarone -Clarified Afib dx previously - pt had 1 hr of SCAF on device, no repeat events; cardiology has not recommended anticoagulation -CHADSVASC: 8; per chart review patient has never been on anticoagulation for Afib (was briefly on Eliquis for a DVT in 2016); she is currently on DAPT for hx CAD w/ stent -Current treatment: Amiodarone 200 mg - 1/2 tab QOD Appropriate, Effective, Safe, Accessible Carvedilol 3.125 mg AM - Appropriate, Effective, Safe, Accessible -Medications previously tried: Eliquis (for DVT, 2016) -Counseled on benefits of rate/rhythm control drugs to prevent tachycardia -Recommended to continue current medication   Heart Failure (Goal: manage symptoms and prevent exacerbations) -Controlled - pt reports swelling is under control with 40 mg of torsemide/day; her kidney function deteriorated with 60 mg of torsemide previously but is now stable -Current home BP/HR readings: 139/80, HR high 50s-low 60s -Last ejection fraction: 50-55% (Date: 11/26/21); previously 10-15% in 2020 -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%); NYHA Class: I-II -Current treatment: Carvedilol 3.125 mg BID -Appropriate, Effective, Safe, Accessible Isosorbide MN 30 mg daily -Appropriate, Effective, Safe, Accessible Losartan 25 mg - 1/2 tab daily -Appropriate, Effective, Safe, Accessible Torsemide 20 mg - 2 tab daily-Appropriate, Effective, Query Safe -Medications previously tried: Jardiance 10 mg -Educated on Benefits of medications for managing symptoms and prolonging life; Importance of blood pressure control -Recommend to continue current medication   CKD stage 4 / HyperPTH (Goal: prevent progression) -Query controlled -  -Hx AKI 01/2022 on torsemide/losartan, improved with holding medication; she has since restarted losartan at 1/2 dose -Pt reports she is overdue for nephrology appt - they have declined to refill calcitriol until she makes an appt; per pt she needs fire dept to  help her down the steps so consolidating lab/doctor appts would be helpful -All medications assessed for renal dosing and appropriateness in chronic kidney disease. -Current treatment  Calcitriol 0.5 mg daily AM -Appropriate, Effective, Safe, Accessible Ferrous sulfate 325 mg BID -Appropriate, Effective, Safe, Accessible Losartan 25 mg - 1/2 tab daily -Appropriate, Effective, Safe, Accessible -Counseled on maintaining adequate hydration; importance of BP and DM control to prevent CKD progression; avoidance of NSAIDs and other nephrotoxins -Recommended to continue current medication   Health Maintenance -Vaccine gaps: Shingrix, covid booster, flu   Patient Goals/Self-Care Activities Patient will:  - take medications as prescribed -focus on medication adherence by pill box -check glucose daily, document, and provide at future appointments -check blood pressure daily, document, and provide at future appointments      Patient verbalizes understanding of instructions and care plan provided today and agrees to view in [place]. Active MyChart status and patient understanding of how to access instructions and care plan via MyChart confirmed with patient.    Telephone follow up appointment with pharmacy team member scheduled for: 2 months  Charlene Brooke, PharmD, BCACP Clinical Pharmacist [facility] [place]   Charlene Brooke, PharmD, [facility] Clinical Pharmacist [place] [facility] [phone]

## 2022-08-08 NOTE — Progress Notes (Signed)
Chronic Care Management Pharmacy Note  08/12/2022 Name:  ALSACE [name] MRN:  [id number] DOB:  1956/09/23  Summary: CCM F/U call -DM: A1c 6.4% (04/2022); per [facility] she is having hypoglycemia between 4am-11am most often; pt reports she was using less insulin when she was on Ozempic previously (stopped due to high cost in donut hole ~May 2023) -Reviewed AGP report: 07/26/22 to 08/08/22. Sensor active: 64%  Time in range (70-180): 67% (goal > 70%)  High (180-250): 20%  Very high (>250): 7%  Low (< 70): 6% (goal < 4%)  GMI: 6.9%; Average glucose: 149  Recommendations: -Recommend to reduce Lantus to 15 units daily -Recommend to restart Ozempic - pursue PAP (will take 4-8 weeks for medication shipment). Hopefully can reduce Lantus and D/C Novolog when starting Ozempic  Follow up [place] will assist with Ozempic PAP -Pharmacist follow up televisit scheduled for 2 months   Subjective: ROYA GIESELMAN is an 66 y.o. year old female who is a primary patient of Damita Dunnings, Elveria Rising, MD.  The CCM team was consulted for assistance with disease management and care coordination needs.    Engaged with patient by telephone for follow up visit in response to provider referral for pharmacy case management and/or care coordination services.   Consent to Services:  The patient was given information about Chronic Care Management services, agreed to services, and gave verbal consent prior to initiation of services.  Please see initial visit note for detailed documentation.   Patient Care Team: [name], MD as PCP - General (Family Medicine) Minna Merritts, MD as PCP - Cardiology (Cardiology) Deboraha Sprang, MD as PCP - Electrophysiology (Cardiology) Bensimhon, Shaune Pascal, MD as PCP - Advanced Heart Failure (Cardiology) Calvert Cantor, MD as Consulting Physician (Ophthalmology) Lavonia Dana, MD as Consulting Physician (Internal Medicine) Elvina Mattes, Adele Schilder  (Inactive) as Attending Physician (Podiatry) Tsosie Billing, MD as Consulting Physician (Infectious Diseases) Alda Berthold, DO as Consulting Physician (Neurology) [name], [facility] as Pharmacist (Pharmacist) Earlie Server, MD as Consulting Physician (Oncology)   Patient has been living with her mother since her toe amputation so she can help with dressing changes.   Recent office visits: 04/23/22 Dr Damita Dunnings OV: A1c 6.4. Stay off [place].  If AM if below 100, then cut 1 unit of insulin a day.  If above 150, then add 1 unit.  If 100-150, then no change.    01/30/22 repeat BMP - Cr improved. Repeat BMP 1 month. 01/15/22 Dr Damita Dunnings OV: f/u CKD. Updated amiodarone 200 mg to 1/2 tab QOD, ferrous sulfate to QOD, Gabapentin to 300 mg daily, Novolog to SSI. Cr significnatly worse - advised to hold losartan and torsemide x 2 days, then resume. Repeat BMP 1 week.  09/24/21 Dr Damita Dunnings VV (telephone): advised chekcing pre and post-meal sugars. Rec CGM. Labs ordered (CMP, CBC, A1C, lipids)  02/15/21 Damita Dunnings (PCP) [facility] f/u. Osteomyelitis. Reduce Gabapentin 600 mg HS & start Novolog sliding scale. Advised to restart torsemide 20 mg daily PRN leg swelling. Recheck BMP/K in 1 week.  Recent consult visits: 04/05/22 Dr Haroldine Laws (HF clinic): no changes.  03/06/22 Dr Holley Raring (Nephrology): f/u CKD. GFR 23. A1c 7.7%. Hold Wilder Glade due to low GFR.  01/03/22 Dr Scot Dock (Vascular): new pt - PAD. S/p L leg balloon angioplasty. Preop eval for TKA today. Will require aggressive DVT ppx post-op.  11/20/21 Dr Caryl Comes (Cardiology): f/u NICM, CHF. Decrease amiodarone to 200 mg 5x per week. Cr 2.18, GFR 24 -  requires eval per PCP.  11/14/21 NP Allena Katz (HF clinic): f/u HF. Labs stable. Ordered ABI, ECHO. No med changes.  Hospital visits: 05/07/22 admission for TKA.  10/12/21 ED visit [facility]): CHF exacerbation. IV lasix in ED, temporarily increased torsemide for 5 days.  Admitted 02/2021 for cellulitis. She  underwent amputation of left great toe 5/27. Repeat arterial studies 5/27 showed a 50-59% stenosis on the distal left popliteal stent segment, no new interventions per vascular. ID was consulted and recommended 6 weeks of abx. Tunneled central line placed (PICC avoided due to CKD) and she was discharged on Daptomycin  Admitted 01/2021 for osteomyelitis of left foot after failing outpatient management. Had initial surgery (I&D, bone biopsy, and sesamoidectomy). Vascular was consulted after it was felt she was high risk for limb loss due to dampened monophasic signals in left foot on ABI. She underwent CO2 angiogram with angioplasty of 3 separate in-stent stenoses in the left superficial femoral artery, and then revision of surgery (bone resection of 5th metatarsal head and base of the prox phalanx of the 5th). Torsemide held at discharge due to worsening renal function.  Objective:  Lab Results  Component Value Date   CREATININE 2.08 (H) 05/08/2022   BUN 41 (H) 05/08/2022   GFR 23.72 (L) 01/30/2022   GFRNONAA 25 (L) 05/08/2022   GFRAA 23 (L) 11/05/2019   NA 137 05/08/2022   K 5.1 05/08/2022   CALCIUM 8.8 (L) 05/08/2022   CO2 27 05/08/2022   GLUCOSE 389 (H) 05/08/2022    Lab Results  Component Value Date/Time   HGBA1C 6.4 (A) 04/23/2022 04:10 PM   HGBA1C 7.7 (H) 01/15/2022 03:49 PM   HGBA1C 8.1 (H) 10/03/2021 11:37 AM   FRUCTOSAMINE 305 (H) 12/24/2018 05:53 PM   GFR 23.72 (L) 01/30/2022 12:43 PM   GFR 15.31 (L) 01/15/2022 03:49 PM   MICROALBUR 6.9 08/06/2016 02:32 PM   MICROALBUR 10.0 12/12/2015 03:48 PM    Last diabetic Eye exam: No results found for: "HMDIABEYEEXA"  Last diabetic Foot exam: No results found for: "HMDIABFOOTEX"   Lab Results  Component Value Date   CHOL 96 10/03/2021   HDL 38.90 (L) 10/03/2021   LDLCALC 43 10/03/2021   TRIG 70.0 10/03/2021   CHOLHDL 2 10/03/2021       Latest Ref Rng & Units 04/10/2022   11:55 AM 10/03/2021   11:37 AM 03/03/2021    1:31 AM   Hepatic Function  Total Protein 6.5 - 8.1 g/dL 7.3  6.4  6.1   Albumin 3.5 - 5.0 g/dL 3.7  3.2  3.0   AST 15 - 41 U/L _0 ALT 0 - 44 U/L _1 Alk Phosphatase 38 - 126 U/L 76  82  55   Total Bilirubin 0.3 - 1.2 mg/dL 0.4  0.3  0.4     Lab Results  Component Value Date/Time   TSH 1.120 11/20/2021 12:32 PM   TSH 1.160 03/27/2021 12:47 PM   FREET4 1.19 03/22/2019 11:59 AM   FREET4 1.1 04/16/2016 04:50 PM       Latest Ref Rng & Units 05/08/2022    2:40 AM 04/10/2022   11:55 AM 01/15/2022    3:49 PM  CBC  WBC 4.0 - 10.5 K/uL 7.6  7.5  7.3   Hemoglobin 12.0 - 15.0 g/dL 9.4  11.1  9.6   Hematocrit 36.0 - 46.0 % 29.5  35.0  29.3   Platelets 150 - 400  K/uL 174  210  220.0    Iron/TIBC/Ferritin/ %Sat    Component Value Date/Time   IRON 55 04/10/2022 1155   IRON 39 02/03/2019 1611   TIBC 259 04/10/2022 1155   TIBC 285 02/03/2019 1611   FERRITIN 305 04/10/2022 1155   FERRITIN 239 (H) 02/03/2019 1611   IRONPCTSAT 21 04/10/2022 1155   IRONPCTSAT 14 (L) 02/03/2019 1611   IRONPCTSAT 6 (L) 12/30/2014 1840     Lab Results  Component Value Date/Time   VD25OH 32 06/01/2012 10:00 AM   CHA2DS2-VASc Score =    The patient's score is based upon:       Clinical ASCVD: Yes  The ASCVD Risk score (Arnett DK, et al., 2019) failed to calculate for the following reasons:   The patient has a prior MI or stroke diagnosis       03/30/2021   10:46 AM 03/21/2021    9:24 AM 09/28/2020   10:36 AM  Depression screen PHQ 2/9  Decreased Interest 0 0 0  Down, Depressed, Hopeless  0 1  PHQ - 2 Score 0 0 1  Altered sleeping  0 0  Tired, decreased energy  0 3  Change in appetite  0 3  Feeling bad or failure about yourself   0 0  Trouble concentrating  0 0  Moving slowly or fidgety/restless  0 0  Suicidal thoughts  0 0  PHQ-9 Score  0 7  Difficult doing work/chores  Not difficult at all Not difficult at all    Social History   Tobacco Use  Smoking Status Never  Smokeless  Tobacco Never   BP Readings from Last 3 Encounters:  07/09/22 (!) 100/53  06/11/22 132/61  05/08/22 (!) 133/53   Pulse Readings from Last 3 Encounters:  07/09/22 62  06/11/22 68  05/08/22 60   Wt Readings from Last 3 Encounters:  07/09/22 268 lb (121.6 kg)  06/11/22 258 lb (117 kg)  05/20/22 258 lb (117 kg)   BMI Readings from Last 3 Encounters:  07/09/22 40.75 kg/m  06/11/22 40.41 kg/m  05/20/22 39.81 kg/m    Assessment/Interventions: Review of patient past medical history, allergies, medications, health status, including review of consultants reports, laboratory and other test data, was performed as part of comprehensive evaluation and provision of chronic care management services.   SDOH:  (Social Determinants of Health) assessments and interventions performed: Yes SDOH Interventions    Flowsheet Row Clinical Support from 03/21/2021 in [place] at [place] Interventions   Depression Interventions/Treatment  HYW7-3 Score <4 Follow-up Not Indicated      [place]: No Food Insecurity (03/21/2021)  Housing: Low Risk  (03/21/2021)  Transportation Needs: No Transportation Needs (03/21/2021)  Alcohol Screen: Low Risk  (03/21/2021)  Depression (PHQ2-9): Low Risk  (03/30/2021)  Financial Resource Strain: Low Risk  (03/21/2021)  Physical Activity: Inactive (03/21/2021)  Social Connections: Somewhat Isolated (09/08/2017)  Stress: No Stress Concern Present (03/21/2021)  Tobacco Use: Low Risk  (07/09/2022)    [place]  Allergies  Allergen Reactions   Heparin Other (See Comments)    Can not remember reaction   Nsaids Other (See Comments)    Kidney disease    Medications Reviewed Today     Reviewed by [name], [facility] (Pharmacist) on 08/12/22 at [place] List Status: <None>   Medication Order Taking? Sig Documenting Provider Last Dose Status Informant  acetaminophen (TYLENOL) 500 MG tablet [id number] Yes Take 2  tablets (1,000 mg total) by mouth every 6 (six) hours as needed for moderate pain or headache. Irving Copas, PA-C Taking Active   amiodarone (PACERONE) 200 MG tablet [id number] Yes TAKE 1/2 TABLET BY MOUTH EVERY OTHER DAY [name], MD Taking Active Self  atorvastatin (LIPITOR) 10 MG tablet [id number] Yes TAKE 1 TABLET BY MOUTH ONCE DAILY [name], MD Taking Active Self  calcitRIOL (ROCALTROL) 0.5 MCG capsule [id number] Yes Take 0.5 mcg by mouth every morning. [provider] Taking Active Self  Calcium Carb-Cholecalciferol (CALCIUM CARBONATE-VITAMIN D3) 600-400 MG-UNIT TABS [id number] Yes Take 1 tablet by mouth 2 (two) times a day. [provider] Taking Active Self  carvedilol (COREG) 3.125 MG tablet [id number] Yes TAKE 1 TABLET BY MOUTH TWICE (2) DAILY WITH A MEAL [name], MD Taking Active   clopidogrel (PLAVIX) 75 MG tablet [id number] Yes TAKE 1 TABLET BY MOUTH ONCE A DAY Deboraha Sprang, MD Taking Active   Continuous Blood Gluc Receiver (FREESTYLE LIBRE 2 READER) DEVI [id number] Yes Use to check sugar multiple times per day as directed.  H/o DM2, treated with insulin. [name], MD Taking Active Self  Continuous Blood Gluc Receiver (FREESTYLE LIBRE 2 READER) Kerrin Mo [id number] Yes FreeStyle Libre 2 Reader [provider] Taking Active Self  DULoxetine (CYMBALTA) 30 MG capsule [id number] Yes Take 1 capsule (30 mg total) by mouth at bedtime. Narda Amber K, DO Taking Active   gabapentin (NEURONTIN) 300 MG capsule [id number] Yes Take 300 mg by mouth 3 (three) times daily. [provider] Taking Active   insulin glargine (LANTUS) 100 UNIT/ML injection [id number] Yes Inject 18 Units into the skin at bedtime. [provider] Taking Active Self           Med Note (GARNER, TIFFANY L   Tue Jul 09, 2022 12:06 PM)    Insulin Pen Needle (PEN NEEDLES) 32G X 6 MM MISC [id number] Yes 1 each by Does not apply route 4 (four) times daily. Jacelyn Pi, Lilia Argue, MD Taking Active Self  isosorbide mononitrate (IMDUR) 30 MG 24 hr tablet [id number] Yes TAKE 1 TABLET BY MOUTH ONCE DAILY Bensimhon, Shaune Pascal, MD Taking Active   levothyroxine (SYNTHROID) 50 MCG tablet [id number] Yes TAKE 1 TABLET BY MOUTH ONCE DAILY BEFOREBREAKFAST [name], MD Taking Active   losartan (COZAAR) 25 MG tablet [id number] Yes TAKE 1/2 TABLET BY MOUTH DAILY Bensimhon, Shaune Pascal, MD Taking Active Self  methocarbamol (ROBAXIN) 500 MG tablet [id number] Yes Take 1 tablet (500 mg total) by mouth 2 (two) times daily. Sponseller, Gypsy Balsam, PA-C Taking Active   Multiple Vitamin (MULTIVITAMIN WITH MINERALS) TABS tablet [id number] Yes Take 1 tablet by mouth every morning. [provider] Taking Active Self  nitroGLYCERIN (NITROSTAT) 0.4 MG SL tablet [id number] Yes Place 0.4 mg under the tongue every 5 (five) minutes as needed for chest pain. [provider] Taking Active   NOVOLOG FLEXPEN 100 UNIT/ML FlexPen [id number] Yes INJECT 0.05 ML (5 UNITS TOTAL) INTO THE SKIN 3 TIMES DAILY BEFORE MEALS [name], MD Taking Active   Omega-3 Fatty Acids (FISH OIL) 1000 MG CAPS [id number] Yes Take 1,000 mg by mouth 2 (two) times a day. [provider] Taking Active Self  Donald Siva test strip [id number] Yes USE AS INSTRUCTED TO CHECK BLOOD SUGAR 3TIMES DAILY [name], MD Taking Active   oxyCODONE (OXY IR/ROXICODONE) 5 MG immediate release tablet [id number] Yes Take 1 tablet (5 mg total) by mouth every  4 (four) hours as needed for severe pain. Irving Copas, PA-C Taking Active   polyethylene glycol (MIRALAX / GLYCOLAX) 17 g packet [id number] Yes Take 17 g by mouth daily as needed for mild constipation. Irving Copas, PA-C Taking Active   torsemide (DEMADEX) 20 MG tablet [id number]  Take 1 tablet (20 mg total) by mouth 2 (two) times daily. Bensimhon, Shaune Pascal, MD  Expired 07/28/22 2359   traMADol (ULTRAM) 50 MG tablet [id number] Yes Take 1-2 tablets  (50-100 mg total) by mouth at bedtime as needed (don't take with oxycodone.). [name], MD Taking Active             Patient Active Problem List   Diagnosis Date Noted   History of myocardial infarction 07/05/2022   Spondylosis without myelopathy 07/05/2022   Lumbar radiculopathy 06/18/2022   S/P total knee arthroplasty, right 05/07/2022   Knee pain 04/24/2022   Localized osteoarthritis of knees, bilateral 07/25/2021   Medication monitoring encounter 03/31/2021   Osteomyelitis ([place]) 02/18/2021   CKD (chronic kidney disease) stage 4, GFR 15-29 ml/min ([place]) 01/28/2021   AICD (automatic cardioverter/defibrillator) present 01/28/2021   Diabetic foot ulcer ([place]) 11/24/2020   Advance care planning 10/04/2020   Secondary hyperparathyroidism of renal origin ([place]) 07/20/2019   Hyperkalemia 07/20/2019   Benign hypertensive kidney disease with chronic kidney disease 07/20/2019   Anemia in chronic kidney disease 07/20/2019   Positive antinuclear antibody 07/20/2019   Proteinuria 07/20/2019   Acute on chronic systolic (congestive) heart failure ([place]) 03/23/2019   UTI (urinary tract infection) 03/09/2019   CAD (coronary artery disease) 03/09/2019   DM type 2, uncontrolled, with renal complications 85/88/5027   Atrial tachycardia    Acute kidney injury superimposed on chronic kidney disease ([place])    AKI (acute kidney injury) ([place])    Weakness generalized 12/22/2018   Shortness of breath 12/22/2018   Palliative care encounter 12/22/2018   Endocarditis 12/14/2018   NSTEMI (non-ST elevated myocardial infarction) ([place]) 11/22/2018   Acute on chronic combined systolic and diastolic CHF (congestive heart failure) ([place]) 10/28/2018   Lymphedema 10/28/2018   Severe sepsis ([place]) 09/28/2018   History of amputation of lesser toe of right foot ([place]) 04/19/2018   Presence of cardiac resynchronization therapy defibrillator (CRT-D) 04/19/2018   Paraparesis of both lower limbs ([place]) 03/29/2018    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   Paroxysmal atrial fibrillation ([place]) 03/29/2018   Class 3 obesity due to excess calories with serious comorbidity and body mass index (BMI) of 40.0 to 44.9 in adult 09/02/2016   History of CVA (cerebrovascular accident) 11/11/2015   Chronic renal insufficiency 08/04/2015   OSA (obstructive sleep apnea) 08/15/2014   Morbid obesity ([place]) 10/26/2013   Essential hypertension, benign 04/29/2013   Pure hypercholesterolemia 12/07/2012   Iron deficiency anemia 12/07/2012   Diabetic peripheral neuropathy associated with type 2 diabetes mellitus ([place]) 74/09/8785   Chronic systolic congestive heart failure ([place]) 07/29/2012   Left bundle-branch block 07/25/2012   Coronary artery disease involving native coronary artery of native heart without angina pectoris 07/25/2012   Heart failure ([place]) 07/21/2012   Hyperlipidemia 07/21/2012    Immunization History  Administered Date(s) Administered   Fluad Quad(high Dose 65+) 10/03/2021, 08/08/2022   Hepatitis B 03/16/2013   Hepatitis B, adult 03/30/2014, 07/18/2014   Influenza Split 07/21/2012, 07/03/2020   Influenza, High Dose Seasonal PF 07/01/2019   Influenza,inj,Quad PF,6+ Mos 06/16/2013, 07/07/2014, 06/18/2015, 08/06/2016, 07/20/2018   Influenza-Unspecified 07/23/2017, 07/22/2018, 06/11/2019   PFIZER Comirnaty(Gray Top)Covid-19 Tri-Sucrose Vaccine 06/21/2021  PFIZER(Purple Top)SARS-COV-2 Vaccination 11/21/2019, 12/21/2019, 07/19/2020   PNEUMOCOCCAL CONJUGATE-20 06/21/2021   Pneumococcal Polysaccharide-23 08/08/2011, 06/18/2015, 08/13/2016   Tdap 03/07/2010, 05/03/2016   Zoster, Live 04/06/2014    Conditions to be addressed/monitored:  Hypertension, Hyperlipidemia, Diabetes, Atrial Fibrillation, Heart Failure, Coronary Artery Disease, and Chronic Kidney Disease  Care Plan : [place]  Updates made by [name], [place] since 08/12/2022 12:00 AM     Problem: Hypertension,  Hyperlipidemia, Diabetes, Atrial Fibrillation, Heart Failure, Coronary Artery Disease, and Chronic Kidney Disease   Priority: High     Long-Range Goal: Disease management   Start Date: 06/29/2021  Expected End Date: 12/05/2022  This Visit's Progress: On track  Recent Progress: On track  Priority: High  Note:   Current Barriers:  Unable to maintain control of DM, CKD   Pharmacist Clinical Goal(s):  Patient will adhere to plan to optimize therapeutic regimen for DM, HF as evidenced by report of adherence to recommended medication management changes through collaboration with PharmD and provider.    Interventions: 1:1 collaboration with [name], MD regarding development and update of comprehensive plan of care as evidenced by provider attestation and co-signature Inter-disciplinary care team collaboration (see longitudinal plan of care) Comprehensive medication review performed; medication list updated in electronic medical record   Hyperlipidemia: (LDL goal < 70) -Controlled - LDL 43 (09/2021) at goal; pt reports compliance with medications as prescribed; she endorses some bleeding issues - when she nicks herself it takes longer to stop bleeding, she denies blood in urine/stool -Hx CAD - MI 07/2012 w/ stent; Hx stroke -Current treatment: Atorvastatin 10 mg daily - Appropriate, Effective, Safe, Accessible Isosorbide MN 30 mg daily - Appropriate, Effective, Safe, Accessible Nitroglycerin 0.4 mg SL prn - Appropriate, Effective, Safe, Accessible Clopidogrel 75 mg daily HS - Appropriate, Effective, Safe, Accessible Aspirin 81 mg daily - Appropriate, Effective, Safe, Accessible -Educated on Cholesterol goals; Benefits of statin for ASCVD risk reduction; -Counseled on benefits of clopidogrel/aspirin for stent patency; discussed nuisance bleeding vs major bleeding; counseled on signs/symptoms of major bleeding and when to seek emergency care -Recommended to continue current medication    Diabetes (A1c goal <7%) -Controlled - A1c 6.4% (04/2022) at goal; pt reports she adjusts PM Lantus dose based on PM glucose reading; she has been taking up to 8-10 units of Novolog -Pt eats 2 meals per day -Pt reports rare hypoglycemia; she uses peanut butter for hypoglycemic episodes -Hx toe amputation, osteomyelitis -Home BG (via [facility] - uses reader) Reviewed AGP report: 07/26/22 to 08/08/22. Sensor active: 64%  Time in range (70-180): 67% (goal > 70%)  High (180-250): 20%  Very high (>250): 7%  Low (< 70): 6% (goal < 4%)  GMI: 6.9%; Average glucose: 149  -Current medications: Lantus 18 units daily HS -Appropriate, Effective, Query Safe Novolog 5-10 units TID per sliding scale - usually BID -Appropriate, Effective, Safe, Accessible Freestyle Libre 2 (w/ reader) - Appropriate, Effective, Safe, Accessible -Medications previously tried: Trulicity, glimepiride, Jardiance, metformin, Januvia, Ozempic (cost in donut hole), Farxiga (low GFR) -Reviewed CGM report: lows are occurring between 4am-11am; she is taking Lantus at night -Recommend to reduce Lantus to 15 units daily -Recommend to restart Ozempic - pursue PAP. Hopefully can d/c Novolog when starting Ozempic.   Atrial [place]/NSVT/AFIB (Goal: prevent stroke and major bleeding) -Controlled - pt reports palpitations have improved since being on amiodarone -Clarified Afib dx previously - pt had 1 hr of SCAF on device, no repeat events; cardiology has not recommended  anticoagulation -CHADSVASC: 8; per chart review patient has never been on anticoagulation for Afib (was briefly on Eliquis for a DVT in 2016); she is currently on DAPT for hx CAD w/ stent -Current treatment: Amiodarone 200 mg - 1/2 tab QOD Appropriate, Effective, Safe, Accessible Carvedilol 3.125 mg AM - Appropriate, Effective, Safe, Accessible -Medications previously tried: Eliquis (for DVT, 2016) -Counseled on benefits of rate/rhythm control drugs to prevent  tachycardia -Recommended to continue current medication   Heart Failure (Goal: manage symptoms and prevent exacerbations) -Controlled - pt reports swelling is under control with 40 mg of torsemide/day; her kidney function deteriorated with 60 mg of torsemide previously but is now stable -Current home BP/HR readings: 139/80, HR high 50s-low 60s -Last ejection fraction: 50-55% (Date: 11/26/21); previously 10-15% in 2020 -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%); NYHA Class: I-II -Current treatment: Carvedilol 3.125 mg BID -Appropriate, Effective, Safe, Accessible Isosorbide MN 30 mg daily -Appropriate, Effective, Safe, Accessible Losartan 25 mg - 1/2 tab daily -Appropriate, Effective, Safe, Accessible Torsemide 20 mg - 2 tab daily-Appropriate, Effective, Query Safe -Medications previously tried: Jardiance 10 mg -Educated on Benefits of medications for managing symptoms and prolonging life; Importance of blood pressure control -Recommend to continue current medication   CKD stage 4 / HyperPTH (Goal: prevent progression) -Query controlled -  -Hx AKI 01/2022 on torsemide/losartan, improved with holding medication; she has since restarted losartan at 1/2 dose -Pt reports she is overdue for nephrology appt - they have declined to refill calcitriol until she makes an appt; per pt she needs fire dept to help her down the steps so consolidating lab/doctor appts would be helpful -All medications assessed for renal dosing and appropriateness in chronic kidney disease. -Current treatment  Calcitriol 0.5 mg daily AM -Appropriate, Effective, Safe, Accessible Ferrous sulfate 325 mg BID -Appropriate, Effective, Safe, Accessible Losartan 25 mg - 1/2 tab daily -Appropriate, Effective, Safe, Accessible -Counseled on maintaining adequate hydration; importance of BP and DM control to prevent CKD progression; avoidance of NSAIDs and other nephrotoxins -Recommended to continue current medication   Health  Maintenance -Vaccine gaps: Shingrix, covid booster, flu   Patient Goals/Self-Care Activities Patient will:  - take medications as prescribed -focus on medication adherence by pill box -check glucose daily, document, and provide at future appointments -check blood pressure daily, document, and provide at future appointments     Medication Assistance: None required.  Patient affirms current coverage meets needs.  Compliance/Adherence/Medication fill history: Care Gaps: DEXA Eye exam (04/09/19) Foot exam (07/21/19)  Star-Rating Drugs: Atorvastatin - PDC 79% Losartan - PDC 97%  Medication Access: Within the past 30 days, how often has patient missed a dose of medication? 0 Is a pillbox or other method used to improve adherence? Yes  Factors that may affect medication adherence? no barriers identified Are meds synced by current pharmacy? No  Are meds delivered by current pharmacy? No  Does patient experience delays in picking up medications due to transportation concerns? No   [facility] Reviewed: Is patient disadvantaged to use [facility]?: No  Current Rx insurance plan: H[employer] Name and location of Current pharmacy:  [facility], [place] [address] [place] [address] Phone: [phone] Fax: [phone]  [facility] services reviewed with patient today?: No  Patient requests to transfer care to [facility]?: No  Reason patient declined to change pharmacies: Loyalty to other pharmacy/Patient preference   Care Plan and Follow Up Patient Decision:  Patient agrees to  Care Plan and Follow-up.  Plan: Telephone follow up appointment with care management team member scheduled for:  2 months  Charlene Brooke, PharmD, [facility] Clinical Pharmacist [facility] [phone]

## 2022-08-12 ENCOUNTER — Telehealth: Payer: Self-pay | Admitting: Pharmacist

## 2022-08-12 NOTE — Telephone Encounter (Signed)
Patient brought [place] reader to office to download CGM report.  Reviewed AGP report: 07/26/22 to 08/08/22. Sensor active: 64%  Time in range (70-180): 67% (goal > 70%)  High (180-250): 20%  Very high (>250): 7%  Low (< 70): 6% (goal < 4%)  GMI: 6.9%; Average glucose: 149   Patient is having hypoglycemia ~4am-11am. She is currently taking Lantus 18 units at night and Novolog 8-10 units with meals. She was previously on Ozempic and using less insulin, but since being off Ozempic she has needed more insulin.  Recommend to reduce Lantus to 15 units. Recommend to restart Ozempic - pursue PAP (will take 4-8 weeks for shipment); hopefully will be able to further reduce Lantus and possible d/c Novolog when starting Ozempic.

## 2022-08-13 NOTE — Telephone Encounter (Signed)
Agreed and thanks

## 2022-08-15 LAB — HM DIABETES EYE EXAM

## 2022-08-27 ENCOUNTER — Other Ambulatory Visit: Payer: Self-pay | Admitting: Family Medicine

## 2022-08-28 ENCOUNTER — Telehealth: Payer: Self-pay | Admitting: Family Medicine

## 2022-08-28 ENCOUNTER — Encounter: Payer: Self-pay | Admitting: Internal Medicine

## 2022-08-28 NOTE — Telephone Encounter (Signed)
Left message for patient to call back and schedule Medicare Annual Wellness Visit (AWV) either virtually or phone  Left  my [name] number [phone]   Last AWV 03/21/21  please schedule with Nurse Health Adviser   45 min for awv-i and in office appointments 30 min for awv-s  phone/virtual appointments

## 2022-09-02 ENCOUNTER — Other Ambulatory Visit: Payer: Self-pay | Admitting: Family Medicine

## 2022-09-02 ENCOUNTER — Ambulatory Visit (INDEPENDENT_AMBULATORY_CARE_PROVIDER_SITE_OTHER): Payer: HMO

## 2022-09-02 VITALS — Ht 68.0 in | Wt 268.0 lb

## 2022-09-02 DIAGNOSIS — Z0000 Encounter for general adult medical examination without abnormal findings: Secondary | ICD-10-CM

## 2022-09-02 NOTE — Progress Notes (Signed)
Subjective:   [name] is a 65 y.o. female who presents for Medicare Annual (Subsequent) preventive examination.  Review of Systems    Virtual Visit via Telephone Note  I connected with  [name] on 09/02/22 at 10:45 AM EST by telephone and verified that I am speaking with the correct person using two identifiers.  Location: Patient: Home Provider: Office Persons participating in the virtual visit: patient/Nurse Health Advisor   I discussed the limitations, risks, security and privacy concerns of performing an evaluation and management service by telephone and the availability of in person appointments. The patient expressed understanding and agreed to proceed.  Interactive audio and video telecommunications were attempted between this nurse and patient, however failed, due to patient having technical difficulties OR patient did not have access to video capability.  We continued and completed visit with audio only.  Some vital signs may be absent or patient reported.   Criselda Peaches, LPN  Cardiac Risk Factors include: advanced age (>28mn, >>54women);diabetes mellitus;hypertension     Objective:    Today's Vitals   09/02/22 1049  Weight: 268 lb (121.6 kg)  Height: '5\' 8"'$  (1.727 m)   Body mass index is 40.75 kg/m.     09/02/2022   10:59 AM 06/11/2022    3:34 PM 05/20/2022   11:09 AM 05/07/2022    6:00 PM 04/26/2022    1:34 PM 04/10/2022   11:24 AM 07/23/2021    1:36 PM  Advanced Directives  Does Patient Have a Medical Advance Directive? Yes No No Yes Yes Yes Yes  Type of AParamedicof AEmpireLiving will  HDunfermlineLiving will;Out of facility DNR (pink MOST or yellow form) HCastle PointLiving will Living will  H[place]Living will;Out of facility DNR (pink MOST or yellow form)  Does patient want to make changes to medical advance directive? No - Patient declined   No - Patient  declined     Copy of HBushtonin Chart? Yes - validated most recent copy scanned in chart (See row information)        Would patient like information on creating a medical advance directive?  No - Patient declined         Current Medications (verified) Outpatient Encounter Medications as of 09/02/2022  Medication Sig   acetaminophen (TYLENOL) 500 MG tablet Take 2 tablets (1,000 mg total) by mouth every 6 (six) hours as needed for moderate pain or headache.   amiodarone (PACERONE) 200 MG tablet TAKE 1/2 TABLET BY MOUTH EVERY OTHER DAY   atorvastatin (LIPITOR) 10 MG tablet TAKE 1 TABLET BY MOUTH ONCE DAILY   calcitRIOL (ROCALTROL) 0.5 MCG capsule Take 0.5 mcg by mouth every morning.   Calcium Carb-Cholecalciferol (CALCIUM CARBONATE-VITAMIN D3) 600-400 MG-UNIT TABS Take 1 tablet by mouth 2 (two) times a day.   carvedilol (COREG) 3.125 MG tablet TAKE 1 TABLET BY MOUTH TWICE (2) DAILY WITH A MEAL   clopidogrel (PLAVIX) 75 MG tablet TAKE 1 TABLET BY MOUTH ONCE A DAY   Continuous Blood Gluc Receiver (FREESTYLE LIBRE 2 READER) DEVI Use to check sugar multiple times per day as directed.  H/o DM2, treated with insulin.   Continuous Blood Gluc Receiver (FREESTYLE LIBRE 2 READER) DEVI FreeStyle Libre 2 Reader   DULoxetine (CYMBALTA) 30 MG capsule Take 1 capsule (30 mg total) by mouth at bedtime.   gabapentin (NEURONTIN) 300 MG capsule Take 300 mg by mouth 3 (three)  times daily.   insulin glargine (LANTUS) 100 UNIT/ML injection Inject 18 Units into the skin at bedtime.   Insulin Pen Needle (PEN NEEDLES) 32G X 6 MM MISC 1 each by Does not apply route 4 (four) times daily.   isosorbide mononitrate (IMDUR) 30 MG 24 hr tablet TAKE 1 TABLET BY MOUTH ONCE DAILY   levothyroxine (SYNTHROID) 50 MCG tablet TAKE 1 TABLET BY MOUTH ONCE DAILY BEFOREBREAKFAST   losartan (COZAAR) 25 MG tablet TAKE 1/2 TABLET BY MOUTH DAILY   methocarbamol (ROBAXIN) 500 MG tablet Take 1 tablet (500 mg total) by mouth  2 (two) times daily.   Multiple Vitamin (MULTIVITAMIN WITH MINERALS) TABS tablet Take 1 tablet by mouth every morning.   nitroGLYCERIN (NITROSTAT) 0.4 MG SL tablet Place 0.4 mg under the tongue every 5 (five) minutes as needed for chest pain.   NOVOLOG FLEXPEN 100 UNIT/ML FlexPen INJECT 0.05 ML (5 UNITS TOTAL) INTO THE SKIN 3 TIMES DAILY BEFORE MEALS   Omega-3 Fatty Acids (FISH OIL) 1000 MG CAPS Take 1,000 mg by mouth 2 (two) times a day.   ONETOUCH ULTRA test strip USE AS INSTRUCTED TO CHECK BLOOD SUGAR 3TIMES DAILY   oxyCODONE (OXY IR/ROXICODONE) 5 MG immediate release tablet Take 1 tablet (5 mg total) by mouth every 4 (four) hours as needed for severe pain.   polyethylene glycol (MIRALAX / GLYCOLAX) 17 g packet Take 17 g by mouth daily as needed for mild constipation.   torsemide (DEMADEX) 20 MG tablet Take 1 tablet (20 mg total) by mouth 2 (two) times daily.   traMADol (ULTRAM) 50 MG tablet Take 1-2 tablets (50-100 mg total) by mouth at bedtime as needed (don't take with oxycodone.).   No facility-administered encounter medications on file as of 09/02/2022.    Allergies (verified) Heparin and Nsaids   History: Past Medical History:  Diagnosis Date   Acute myocardial infarction, subendocardial infarction, initial episode of care ([place]) 07/21/2012   Acute osteomyelitis involving ankle and foot ([place]) 42/59/5638   Acute systolic heart failure ([place]) 07/21/2012   New onset 07/19/12; admission to [facility] ED. Elevated Troponins.  S/p 2D-echo with EF 20-25%.  S/p cardiac catheterization with stenting LAD.  Repeat 2D-echo 10/2011 with improved EF of 35%.    Anemia    Arthritis    Automatic implantable cardioverter-defibrillator in situ    a. MDT CRT-D 06/2014, SN: [id number]  .removed in feb 2020   CAD (coronary artery disease)    a. cardiac cath 101/04/2012: PCI/DES to chronically occluded mLAD, consideration PCI to diag branch in 4 weeks.    Cataract    Chicken pox    Chronic kidney  disease    Chronic systolic CHF (congestive heart failure) (HCC)    a. mixed ICM & NICM; b. EF 20-25% by echo 07/2012, mid-dist 2/3 of LV sev HK/AK, mild MR. echo 10/2012: EF 30-35%, sev HK ant-septal & inf walls, GR1DD, mild MR, PASP 33. c. echo 02/2013: EF 30%, GR1DD, mild MR. echo 04/2014: EF 30%, Septal-lat dyssynchrony, global HK, inf AK, GR1DD, mild MR. d. echo 10/2014: EF50-55%, WM nl, GR1DD, septal mild paradox. e. echo 02/2015: EF 50-55%, wm    Depression    Heart attack ([place])    Heel ulcer ([place]) 04/27/2015   History of blood transfusion ~ 2011   "plasma; had neuropathy; couldn't walk"   Hypertension    Hypothyroidism    LBBB (left bundle branch block)    Neuromuscular disorder ([place])    Neuropathy 2011  Obesity, unspecified    OSA on CPAP    Moderate with AHI 23/hr and now on CPAP at 16cm H2O.  Her DME is [facility]   Pure hypercholesterolemia    Sepsis ([place]) 09/2018   Type II diabetes mellitus ([place])    Unspecified vitamin D deficiency    Past Surgical History:  Procedure Laterality Date   ABDOMINAL AORTOGRAM W/LOWER EXTREMITY N/A 02/02/2021   Procedure: ABDOMINAL AORTOGRAM W/LOWER EXTREMITY;  Surgeon: Angelia Mould, MD;  Location: [place] CV LAB;  Service: Cardiovascular;  Laterality: N/A;   AMPUTATION Left 03/02/2021   Procedure: AMPUTATION RAY, left great toe;  Surgeon: Trula Slade, DPM;  Location: [place];  Service: Podiatry;  Laterality: Left;   AMPUTATION TOE Right 06/18/2015   Procedure: AMPUTATION TOE;  Surgeon: Samara Deist, DPM;  Location: [facility] ORS;  Service: Podiatry;  Laterality: Right;   AMPUTATION TOE Left 10/08/2018   Procedure: AMPUTATION TOE LEFT 2ND;  Surgeon: Albertine Patricia, DPM;  Location: [facility] ORS;  Service: Podiatry;  Laterality: Left;   BACK SURGERY     BI-VENTRICULAR IMPLANTABLE CARDIOVERTER DEFIBRILLATOR N/A 07/06/2014   Procedure: BI-VENTRICULAR IMPLANTABLE CARDIOVERTER DEFIBRILLATOR  (CRT-D);  Surgeon: Deboraha Sprang, MD;  Location: [facility] CATH  LAB;  Service: Cardiovascular;  Laterality: N/A;   BI-VENTRICULAR IMPLANTABLE CARDIOVERTER DEFIBRILLATOR  (CRT-D)  07/06/2014   BILATERAL OOPHORECTOMY  01/2011   ovarian cyst benign   BIV ICD INSERTION CRT-D N/A 03/31/2019   Procedure: BIV ICD INSERTION CRT-D;  Surgeon: Constance Haw, MD;  Location: [place] CV LAB;  Service: Cardiovascular;  Laterality: N/A;   COLONOSCOPY WITH PROPOFOL Left 02/22/2015   Procedure: COLONOSCOPY WITH PROPOFOL;  Surgeon: Hulen Luster, MD;  Location: [facility] ENDOSCOPY;  Service: Endoscopy;  Laterality: Left;   CORONARY ANGIOPLASTY WITH STENT PLACEMENT Left 07/2012   new onset systolic CHF; elevated troponins.  Cardiac catheterization with stenting to LAD; EF 15%.  2D-echo: EF 20-25%.   ESOPHAGOGASTRODUODENOSCOPY N/A 02/22/2015   Procedure: ESOPHAGOGASTRODUODENOSCOPY (EGD);  Surgeon: Hulen Luster, MD;  Location: [facility] ENDOSCOPY;  Service: Endoscopy;  Laterality: N/A;   I & D EXTREMITY Left 11/28/2020   Procedure: IRRIGATION AND DEBRIDEMENT OF FOOT;  Surgeon: Trula Slade, DPM;  Location: [place];  Service: Podiatry;  Laterality: Left;   I & D EXTREMITY Left 01/31/2021   Procedure: IRRIGATION AND DEBRIDEMENT EXTREMITY OF LEFT FOOT WITH BONE BIOPSY OF  1ST METATARSAL AND FIFTH METATARSAL;  Surgeon: Felipa Furnace, DPM;  Location: [place];  Service: Podiatry;  Laterality: Left;   I & D EXTREMITY Left 02/03/2021   Procedure: IRRIGATION AND DEBRIDEMENT EXTREMITY WITH INTERNAL AMPUTATION FIRST AND FIFTH RAY;  Surgeon: Felipa Furnace, DPM;  Location: [place];  Service: Podiatry;  Laterality: Left;   INCISION AND DRAINAGE ABSCESS Right 2007   groin; with ICU stay due to sepsis.   IR FLUORO GUIDE CV LINE RIGHT  03/06/2021   IR REMOVAL TUN CV CATH W/O FL  04/27/2021   IR US GUIDE VASC ACCESS RIGHT  03/06/2021   IRRIGATION AND DEBRIDEMENT FOOT Left 04/25/2021   Procedure: EXCISION AND DEBRIDEMENT WOUND ULCER LEFT FOOT; METATARSECTOMY;  Surgeon: Trula Slade, DPM;  [facility];  Service: Podiatry;  Laterality: Left;   LAPAROSCOPIC CHOLECYSTECTOMY  2011   LEFT HEART CATH AND CORONARY ANGIOGRAPHY N/A 10/05/2018   Procedure: LEFT HEART CATH AND CORONARY ANGIOGRAPHY;  Surgeon: Minna Merritts, MD;  Location: [place] CV LAB;  Service: Cardiovascular;  Laterality: N/A;  LEFT HEART CATHETERIZATION WITH CORONARY ANGIOGRAM N/A 07/21/2012   Procedure: LEFT HEART CATHETERIZATION WITH CORONARY ANGIOGRAM;  Surgeon: Jolaine Artist, MD;  Location: [facility] CATH LAB;  Service: Cardiovascular;  Laterality: N/A;   LUMBAR [place]   L4-5   PACEMAKER REMOVAL  11/2018   due to infection around pacemaker   [place] (PCI-S) N/A 07/23/2012   Procedure: PERCUTANEOUS CORONARY STENT INTERVENTION (PCI-S);  Surgeon: Sherren Mocha, MD;  Location: [facility] CATH LAB;  Service: Cardiovascular;  Laterality: N/A;   PERIPHERAL VASCULAR BALLOON ANGIOPLASTY Left 10/06/2018   Procedure: PERIPHERAL VASCULAR BALLOON ANGIOPLASTY;  Surgeon: Katha Cabal, MD;  Location: [place] CV LAB;  Service: Cardiovascular;  Laterality: Left;   PERIPHERAL VASCULAR CATHETERIZATION N/A 02/10/2015   Procedure: Picc Line Insertion;  Surgeon: Katha Cabal, MD;  Location: [place] CV LAB;  Service: Cardiovascular;  Laterality: N/A;   RIGHT HEART CATH N/A 03/29/2019   Procedure: RIGHT HEART CATH;  Surgeon: Jolaine Artist, MD;  Location: [place] CV LAB;  Service: Cardiovascular;  Laterality: N/A;   TEE WITHOUT CARDIOVERSION N/A 10/02/2018   Procedure: TRANSESOPHAGEAL ECHOCARDIOGRAM (TEE);  Surgeon: Wellington Hampshire, MD;  Location: [facility] ORS;  Service: Cardiovascular;  Laterality: N/A;   TOTAL KNEE ARTHROPLASTY Right 05/07/2022   Procedure: TOTAL KNEE ARTHROPLASTY;  Surgeon: Paralee Cancel, MD;  Location: [facility];  Service: Orthopedics;  Laterality: Right;   TUBAL LIGATION  1981   VAGINAL HYSTERECTOMY  01/2011   Fibroids/DUB.  Ovaries removed. [place].    Family History  Problem Relation Age of Onset   Diabetes Mother    Hypertension Mother    Arthritis Mother        knees, lumbar DDD, cervical DDD   Cancer Father        prostate,skin,lymphoma.   Cancer Brother 70       bladder cancer; non-smoker   Diabetes Maternal Grandmother    Heart disease Maternal Grandmother    COPD Maternal Grandmother    Diabetes Paternal Grandmother    Obesity Brother    Diabetes Son    Hypertension Son    Cancer Maternal Grandfather    Diabetes Paternal Grandfather    Social History   Socioeconomic History   Marital status: Widowed    Spouse name: [name]   Number of children: 2   Years of education: 12   Highest education level: High school graduate  Occupational History   Occupation: disabled    Employer: UNEMPLOYED    Comment: 03/2010 for peripheral neuropathy   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  Tobacco Use   Smoking status: Never   Smokeless tobacco: Never  Vaping Use   Vaping Use: Never used  Substance and Sexual Activity   Alcohol use: No    Alcohol/week: 0.0 standard drinks of alcohol   Drug use: No   Sexual activity: Not Currently    Birth control/protection: Post-menopausal, Surgical  Other Topics Concern   Not on file  Social History Narrative   Widowed, husband died when she was in surgery.        Children:  2 children (daughter, son). Two grandsons and 2 step grandchildren.      Lives: with husband, daughter, son-in-law, 2 grandsons, and 1 on the way.      Employment: disability for peripheral neuropathy 2012.  Previously had home daycare.   Social Determinants of Health   Financial Resource Strain: Low Risk  (09/02/2022)   Overall Financial  Resource Strain (CARDIA)    Difficulty of Paying Living Expenses: Not hard at all  Food Insecurity: No Food Insecurity (09/02/2022)   Hunger Vital Sign    Worried About Running Out of Food in the Last Year: Never true    Ran Out of Food in the Last Year: Never true   Transportation Needs: No Transportation Needs (09/02/2022)   PRAPARE - Hydrologist (Medical): No    Lack of Transportation (Non-Medical): No  Physical Activity: Inactive (09/02/2022)   Exercise Vital Sign    Days of Exercise per Week: 0 days    Minutes of Exercise per Session: 0 min  Stress: No Stress Concern Present (09/02/2022)   [place]    Feeling of Stress : Not at all  Social Connections: Socially Isolated (09/02/2022)   Social Connection and Isolation Panel [NHANES]    Frequency of Communication with Friends and Family: More than three times a week    Frequency of Social Gatherings with Friends and Family: More than three times a week    Attends Religious Services: Never    Marine scientist or Organizations: No    Attends Archivist Meetings: Never    Marital Status: Widowed    Tobacco Counseling Counseling given: Not Answered   Clinical Intake:  Pre-visit preparation completed: No  Pain : No/denies pain   Nutrition Risk Assessment:  Has the patient had any N/V/D within the last 2 months?  No  Does the patient have any non-healing wounds?  No  Has the patient had any unintentional weight loss or weight gain?  No   Diabetes:  Is the patient diabetic?  Yes  If diabetic, was a CBG obtained today?  Yes CBG 77 Taken by patient Did the patient bring in their glucometer from home?  No  How often do you monitor your CBG's? 4 X Daily.   Financial Strains and Diabetes Management:  Are you having any financial strains with the device, your supplies or your medication? No .  Does the patient want to be seen by Chronic Care Management for management of their diabetes?  No  Would the patient like to be referred to a Nutritionist or for Diabetic Management?  No   Diabetic Exams:  Diabetic Eye Exam: Completed No. Overdue for diabetic eye exam. Pt has been  advised about the importance in completing this exam. A referral has been placed today. Message sent to referral coordinator for scheduling purposes. Advised pt to expect a call from office referred to regarding appt.  Diabetic Foot Exam: Completed No. Pt has been advised about the importance in completing this exam. Pt is scheduled for diabetic foot exam on Followed by PCP.    BMI - recorded: 40.75 Nutritional Status: BMI > 30  Obese Nutritional Risks: None Diabetes: Yes CBG done?: Yes (CBG 77 Taken by patient) CBG resulted in Enter/ Edit results?: Yes  How often do you need to have someone help you when you read instructions, pamphlets, or other written materials from your doctor or pharmacy?: 1 - Never  Diabetic?  Yes  Interpreter Needed?: No  Information entered by :: Rolene Arbour LPN   Activities of Daily Living    09/02/2022   10:58 AM 05/07/2022    6:00 PM  In your present state of health, do you have any difficulty performing the following activities:  Hearing? 0 0  Vision? 0 0  Difficulty concentrating  or making decisions? 0 0  Walking or climbing stairs? 0 1  Dressing or bathing? 0 0  Doing errands, shopping? 0 0  Preparing Food and eating ? N   Using the Toilet? N   In the past six months, have you accidently leaked urine? N   Do you have problems with loss of bowel control? N   Managing your Medications? N   Managing your Finances? N   Housekeeping or managing your Housekeeping? N     Patient Care Team: [name], MD as PCP - General (Family Medicine) Minna Merritts, MD as PCP - Cardiology (Cardiology) Deboraha Sprang, MD as PCP - Electrophysiology (Cardiology) Bensimhon, Shaune Pascal, MD as PCP - Advanced Heart Failure (Cardiology) Calvert Cantor, MD as Consulting Physician (Ophthalmology) Lavonia Dana, MD as Consulting Physician (Internal Medicine) Elvina Mattes, Adele Schilder (Inactive) as Attending Physician (Podiatry) Tsosie Billing, MD as  Consulting Physician (Infectious Diseases) Alda Berthold, DO as Consulting Physician (Neurology) [name], [facility] as Pharmacist (Pharmacist) Earlie Server, MD as Consulting Physician (Oncology)  Indicate any recent Medical Services you may have received from other than [facility] providers in the past year (date may be approximate).     Assessment:   This is a routine wellness examination for [name].  Hearing/Vision screen Hearing Screening - Comments:: Denies hearing difficulties   Vision Screening - Comments:: Wears reading glasses - up to date with routine eye exams with  Dr Bing Plume  Dietary issues and exercise activities discussed: Exercise limited by: neurologic condition(s)   Goals Addressed               This Visit's Progress     Stay Healthy (pt-stated)         Depression Screen    09/02/2022   10:56 AM 03/30/2021   10:46 AM 03/21/2021    9:24 AM 09/28/2020   10:36 AM 12/06/2019    2:28 PM 09/06/2019    3:02 PM 04/16/2019    4:45 PM  PHQ 2/9 Scores  PHQ - 2 Score 0 0 0 1 0 0 0  PHQ- 9 Score   0 7       Fall Risk    09/02/2022   10:58 AM 05/20/2022   11:09 AM 07/23/2021    1:36 PM 03/30/2021   10:46 AM 03/21/2021    9:23 AM  Fall Risk   Falls in the past year? 0 0 0 0 0  Number falls in past yr: 0 0 0 0 0  Injury with Fall? 0 0 0 0 0  Risk for fall due to : No Fall Risks    Medication side effect  Follow up Falls prevention discussed    Falls evaluation completed;Falls prevention discussed    FALL RISK PREVENTION PERTAINING TO THE HOME:  Any stairs in or around the home? Yes  If so, are there any without handrails? No  Home free of loose throw rugs in walkways, pet beds, electrical cords, etc? Yes  Adequate lighting in your home to reduce risk of falls? Yes   ASSISTIVE DEVICES UTILIZED TO PREVENT FALLS:  Life alert? No  Use of a cane, walker or w/c? Yes  Grab bars in the bathroom? No  Shower chair or bench in shower? Yes  Elevated toilet seat or a  handicapped toilet? Yes   TIMED UP AND GO:  Was the test performed? No . Audio Visit  Cognitive Function:    03/21/2021    9:25 AM  MMSE -  Mini Mental State Exam  Not completed: Refused        09/02/2022   10:59 AM 09/08/2017   11:48 AM  6CIT Screen  What Year? 0 points 0 points  What month? 0 points 0 points  What time? 0 points 0 points  Count back from 20 0 points 0 points  Months in reverse 0 points 0 points  Repeat phrase 0 points 2 points  Total Score 0 points 2 points    Immunizations Immunization History  Administered Date(s) Administered   Fluad Quad(high Dose 65+) 10/03/2021, 08/08/2022   Hepatitis B 03/16/2013   Hepatitis B, adult 03/30/2014, 07/18/2014   Influenza Split 07/21/2012, 07/03/2020   Influenza, High Dose Seasonal PF 07/01/2019   Influenza,inj,Quad PF,6+ Mos 06/16/2013, 07/07/2014, 06/18/2015, 08/06/2016, 07/20/2018   Influenza-Unspecified 07/23/2017, 07/22/2018, 06/11/2019   PFIZER Comirnaty(Gray Top)Covid-19 Tri-Sucrose Vaccine 06/21/2021   PFIZER(Purple Top)SARS-COV-2 Vaccination 11/21/2019, 12/21/2019, 07/19/2020   PNEUMOCOCCAL CONJUGATE-20 06/21/2021   Pneumococcal Polysaccharide-23 08/08/2011, 06/18/2015, 08/13/2016   Tdap 03/07/2010, 05/03/2016   Zoster, Live 04/06/2014      Flu Vaccine status: Up to date  Pneumococcal vaccine status: Up to date  Covid-19 vaccine status: Completed vaccines  Qualifies for Shingles Vaccine? Yes   Zostavax completed No   Shingrix Completed?: No.    Education has been provided regarding the importance of this vaccine. Patient has been advised to call insurance company to determine out of pocket expense if they have not yet received this vaccine. Advised may also receive vaccine at local pharmacy or Health Dept. Verbalized acceptance and understanding.  Screening Tests Health Maintenance  Topic Date Due   COVID-19 Vaccine (5 - 2023-24 season) 09/18/2022 (Originally 06/07/2022)   Zoster Vaccines-  Shingrix (1 of 2) 12/03/2022 (Originally 02/11/1975)   DEXA SCAN  09/03/2023 (Originally 02/12/2019)   HEMOGLOBIN A1C  10/24/2022   MAMMOGRAM  11/08/2022   FOOT EXAM  01/16/2023   OPHTHALMOLOGY EXAM  08/16/2023   COLONOSCOPY (Pts 45-71yr Insurance coverage will need to be confirmed)  02/21/2025   Pneumonia Vaccine 68 Years old  Completed   INFLUENZA VACCINE  Completed   Hepatitis C Screening  Completed   HPV VACCINES  Aged Out    Health Maintenance  There are no preventive care reminders to display for this patient.   Colorectal cancer screening: Type of screening: Colonoscopy. Completed 02/22/15. Repeat every 10 years  Mammogram status: Completed 11/08/20. Repeat every year  Bone Density status: Ordered Patient deferred. Pt provided with contact info and advised to call to schedule appt.  Lung Cancer Screening: (Low Dose CT Chest recommended if Age 68-80years, 30 pack-year currently smoking OR have quit w/in 15years.) does not qualify.     Additional Screening:  Hepatitis C Screening: does qualify; Completed 08/04/15  Vision Screening: Recommended annual ophthalmology exams for early detection of glaucoma and other disorders of the eye. Is the patient up to date with their annual eye exam?  Yes  Who is the provider or what is the name of the office in which the patient attends annual eye exams? Dr DBing PlumeIf pt is not established with a provider, would they like to be referred to a provider to establish care? No .   Dental Screening: Recommended annual dental exams for proper oral hygiene  Community Resource Referral / Chronic Care Management:  CRR required this visit?  No   CCM required this visit?  No      Plan:     I have personally reviewed and noted the  following in the patient's chart:   Medical and social history Use of alcohol, tobacco or illicit drugs  Current medications and supplements including opioid prescriptions. Patient is currently taking opioid  prescriptions. Information provided to patient regarding non-opioid alternatives. Patient advised to discuss non-opioid treatment plan with their provider. Functional ability and status Nutritional status Physical activity Advanced directives List of other physicians Hospitalizations, surgeries, and ER visits in previous 12 months Vitals Screenings to include cognitive, depression, and falls Referrals and appointments  In addition, I have reviewed and discussed with patient certain preventive protocols, quality metrics, and best practice recommendations. A written personalized care plan for preventive services as well as general preventive health recommendations were provided to patient.     Criselda Peaches, LPN   [id number]   Nurse Notes: None

## 2022-09-02 NOTE — Patient Instructions (Addendum)
[name] , Thank you for taking time to come for your Medicare Wellness Visit. I appreciate your ongoing commitment to your health goals. Please review the following plan we discussed and let me know if I can assist you in the future.   These are the goals we discussed:  Goals       DIET - REDUCE SUGAR INTAKE      Patient states that she wants to try to cut back on her sugar intake daily.       Manage My Medicine      Timeframe:  Long-Range Goal Priority:  High Start Date:     06/29/21                        Expected End Date:    06/29/22                   Follow Up Date Jan 2023   - call for medicine refill 2 or 3 days before it runs out - call if I am sick and can't take my medicine - keep a list of all the medicines I take; vitamins and herbals too - use a pillbox to sort medicine    Why is this important?   These steps will help you keep on track with your medicines.   Notes:       Monitor and Manage My Blood Sugar-Diabetes Type 2      Timeframe:  Long-Range Goal Priority:  High Start Date:      09/26/21                       Expected End Date:    09/26/22                   Follow Up Date July 2023   - check blood sugar at prescribed times - check blood sugar before and after exercise - check blood sugar if I feel it is too high or too low - take the blood sugar meter to all doctor visits -Use Freestyle Libre to monitor sugar continuously    Why is this important?   Checking your blood sugar at home helps to keep it from getting very high or very low.  Writing the results in a diary or log helps the doctor know how to care for you.  Your blood sugar log should have the time, date and the results.  Also, write down the amount of insulin or other medicine that you take.  Other information, like what you ate, exercise done and how you were feeling, will also be helpful.     Notes:       Patient Stated      03/21/2021, I will maintain and continue medications as  prescribed.       Stay Healthy (pt-stated)        This is a list of the screening recommended for you and due dates:  Health Maintenance  Topic Date Due   COVID-19 Vaccine (5 - 2023-24 season) 09/18/2022*   Zoster (Shingles) Vaccine (1 of 2) 12/03/2022*   DEXA scan (bone density measurement)  09/03/2023*   Hemoglobin A1C  10/24/2022   Mammogram  11/08/2022   Complete foot exam   01/16/2023   Eye exam for diabetics  08/16/2023   Colon Cancer Screening  02/21/2025   Pneumonia Vaccine  Completed   Flu Shot  Completed   Hepatitis C Screening:  USPSTF Recommendation to screen - Ages 33-79 yo.  Completed   HPV Vaccine  Aged Out  *Topic was postponed. The date shown is not the original due date.   Opioid Pain Medicine Management Opioids are powerful medicines that are used to treat moderate to severe pain. When used for short periods of time, they can help you to: Sleep better. Do better in physical or occupational therapy. Feel better in the first few days after an injury. Recover from surgery. Opioids should be taken with the supervision of a trained health care provider. They should be taken for the shortest period of time possible. This is because opioids can be addictive, and the longer you take opioids, the greater your risk of addiction. This addiction can also be called opioid use disorder. What are the risks? Using opioid pain medicines for longer than 3 days increases your risk of side effects. Side effects include: Constipation. Nausea and vomiting. Breathing difficulties (respiratory depression). Drowsiness. Confusion. Opioid use disorder. Itching. Taking opioid pain medicine for a long period of time can affect your ability to do daily tasks. It also puts you at risk for: Motor vehicle crashes. Depression. Suicide. Heart attack. Overdose, which can be life-threatening. What is a pain treatment plan? A pain treatment plan is an agreement between you and your health  care provider. Pain is unique to each person, and treatments vary depending on your condition. To manage your pain, you and your health care provider need to work together. To help you do this: Discuss the goals of your treatment, including how much pain you might expect to have and how you will manage the pain. Review the risks and benefits of taking opioid medicines. Remember that a good treatment plan uses more than one approach and minimizes the chance of side effects. Be honest about the amount of medicines you take and about any drug or alcohol use. Get pain medicine prescriptions from only one health care provider. Pain can be managed with many types of alternative treatments. Ask your health care provider to refer you to one or more specialists who can help you manage pain through: Physical or occupational therapy. Counseling (cognitive behavioral therapy). Good nutrition. Biofeedback. Massage. Meditation. Non-opioid medicine. Following a gentle exercise program. How to use opioid pain medicine Taking medicine Take your pain medicine exactly as told by your health care provider. Take it only when you need it. If your pain gets less severe, you may take less than your prescribed dose if your health care provider approves. If you are not having pain, do nottake pain medicine unless your health care provider tells you to take it. If your pain is severe, do nottry to treat it yourself by taking more pills than instructed on your prescription. Contact your health care provider for help. Write down the times when you take your pain medicine. It is easy to become confused while on pain medicine. Writing the time can help you avoid overdose. Take other over-the-counter or prescription medicines only as told by your health care provider. Keeping yourself and others safe  While you are taking opioid pain medicine: Do not drive, use machinery, or power tools. Do not sign legal documents. Do  not drink alcohol. Do not take sleeping pills. Do not supervise children by yourself. Do not do activities that require climbing or being in high places. Do not go to a lake, river, ocean, spa, or swimming pool. Do not share your pain medicine with anyone. Keep pain medicine in  a locked cabinet or in a secure area where pets and children cannot reach it. Stopping your use of opioids If you have been taking opioid medicine for more than a few weeks, you may need to slowly decrease (taper) how much you take until you stop completely. Tapering your use of opioids can decrease your risk of symptoms of withdrawal, such as: Pain and cramping in the abdomen. Nausea. Sweating. Sleepiness. Restlessness. Uncontrollable shaking (tremors). Cravings for the medicine. Do not attempt to taper your use of opioids on your own. Talk with your health care provider about how to do this. Your health care provider may prescribe a step-down schedule based on how much medicine you are taking and how long you have been taking it. Getting rid of leftover pills Do not save any leftover pills. Get rid of leftover pills safely by: Taking the medicine to a prescription take-back program. This is usually offered by the county or law enforcement. Bringing them to a pharmacy that has a drug disposal container. Flushing them down the toilet. Check the label or package insert of your medicine to see whether this is safe to do. Throwing them out in the trash. Check the label or package insert of your medicine to see whether this is safe to do. If it is safe to throw it out, remove the medicine from the original container, put it into a sealable bag or container, and mix it with used coffee grounds, food scraps, dirt, or cat litter before putting it in the trash. Follow these instructions at home: Activity Do exercises as told by your health care provider. Avoid activities that make your pain worse. Return to your normal  activities as told by your health care provider. Ask your health care provider what activities are safe for you. General instructions You may need to take these actions to prevent or treat constipation: Drink enough fluid to keep your urine pale yellow. Take over-the-counter or prescription medicines. Eat foods that are high in fiber, such as beans, whole grains, and fresh fruits and vegetables. Limit foods that are high in fat and processed sugars, such as fried or sweet foods. Keep all follow-up visits. This is important. Where to find support If you have been taking opioids for a long time, you may benefit from receiving support for quitting from a local support group or counselor. Ask your health care provider for a referral to these resources in your area. Where to find more information Centers for Disease Control and Prevention (CDC): http://www.wolf.info/ [place] Food and Drug Administration (FDA): [place].ch Get help right away if: You may have taken too much of an opioid (overdosed). Common symptoms of an overdose: Your breathing is slower or more shallow than normal. You have a very slow heartbeat (pulse). You have slurred speech. You have nausea and vomiting. Your pupils become very small. You have other potential symptoms: You are very confused. You faint or feel like you will faint. You have cold, clammy skin. You have blue lips or fingernails. You have thoughts of harming yourself or harming others. These symptoms may represent a serious problem that is an emergency. Do not wait to see if the symptoms will go away. Get medical help right away. Call your local emergency services (911 in the [place] Do not drive yourself to the hospital.  If you ever feel like you may hurt yourself or others, or have thoughts about taking your own life, get help right away. Go to your nearest emergency department or:  Call your local emergency services (911 in the [place] Call the [facility] (660) 372-1208 in the [place] Call a suicide crisis helpline, such as the [place] at 236 801 4462 or 988 in the [place]. This is open 24 hours a day in the [place] Text the Crisis Text Line at 249-556-0352 (in the [place].). Summary Opioid medicines can help you manage moderate to severe pain for a short period of time. A pain treatment plan is an agreement between you and your health care provider. Discuss the goals of your treatment, including how much pain you might expect to have and how you will manage the pain. If you think that you or someone else may have taken too much of an opioid, get medical help right away. This information is not intended to replace advice given to you by your health care provider. Make sure you discuss any questions you have with your health care provider. Document Revised: 04/18/2021 Document Reviewed: 01/03/2021 Elsevier Patient Education  [place] directives: In Chart  Conditions/risks identified: None  Next appointment: Follow up in one year for your annual wellness visit     Preventive Care 65 Years and Older, Female Preventive care refers to lifestyle choices and visits with your health care provider that can promote health and wellness. What does preventive care include? A yearly physical exam. This is also called an annual well check. Dental exams once or twice a year. Routine eye exams. Ask your health care provider how often you should have your eyes checked. Personal lifestyle choices, including: Daily care of your teeth and gums. Regular physical activity. Eating a healthy diet. Avoiding tobacco and drug use. Limiting alcohol use. Practicing safe sex. Taking low-dose aspirin every day. Taking vitamin and mineral supplements as recommended by your health care provider. What happens during an annual well check? The services and screenings done by your health care provider during your annual well  check will depend on your age, overall health, lifestyle risk factors, and family history of disease. Counseling  Your health care provider may ask you questions about your: Alcohol use. Tobacco use. Drug use. Emotional well-being. Home and relationship well-being. Sexual activity. Eating habits. History of falls. Memory and ability to understand (cognition). Work and work Statistician. Reproductive health. Screening  You may have the following tests or measurements: Height, weight, and BMI. Blood pressure. Lipid and cholesterol levels. These may be checked every 5 years, or more frequently if you are over 21 years old. Skin check. Lung cancer screening. You may have this screening every year starting at age 52 if you have a 30-pack-year history of smoking and currently smoke or have quit within the past 15 years. Fecal occult blood test (FOBT) of the stool. You may have this test every year starting at age 65. Flexible sigmoidoscopy or colonoscopy. You may have a sigmoidoscopy every 5 years or a colonoscopy every 10 years starting at age 108. Hepatitis C blood test. Hepatitis B blood test. Sexually transmitted disease (STD) testing. Diabetes screening. This is done by checking your blood sugar (glucose) after you have not eaten for a while (fasting). You may have this done every 1-3 years. Bone density scan. This is done to screen for osteoporosis. You may have this done starting at age 69. Mammogram. This may be done every 1-2 years. Talk to your health care provider about how often you should have regular mammograms. Talk with your health care provider about your test results, treatment  options, and if necessary, the need for more tests. Vaccines  Your health care provider may recommend certain vaccines, such as: Influenza vaccine. This is recommended every year. Tetanus, diphtheria, and acellular pertussis (Tdap, Td) vaccine. You may need a Td booster every 10 years. Zoster  vaccine. You may need this after age 58. Pneumococcal 13-valent conjugate (PCV13) vaccine. One dose is recommended after age 51. Pneumococcal polysaccharide (PPSV23) vaccine. One dose is recommended after age 65. Talk to your health care provider about which screenings and vaccines you need and how often you need them. This information is not intended to replace advice given to you by your health care provider. Make sure you discuss any questions you have with your health care provider. Document Released: 10/20/2015 Document Revised: 06/12/2016 Document Reviewed: 07/25/2015 Elsevier Interactive Patient Education  2017 [place] Prevention in the Home Falls can cause injuries. They can happen to people of all ages. There are many things you can do to make your home safe and to help prevent falls. What can I do on the outside of my home? Regularly fix the edges of walkways and driveways and fix any cracks. Remove anything that might make you trip as you walk through a door, such as a raised step or threshold. Trim any bushes or trees on the path to your home. Use bright outdoor lighting. Clear any walking paths of anything that might make someone trip, such as rocks or tools. Regularly check to see if handrails are loose or broken. Make sure that both sides of any steps have handrails. Any raised decks and porches should have guardrails on the edges. Have any leaves, snow, or ice cleared regularly. Use sand or salt on walking paths during winter. Clean up any spills in your garage right away. This includes oil or grease spills. What can I do in the bathroom? Use night lights. Install grab bars by the toilet and in the tub and shower. Do not use towel bars as grab bars. Use non-skid mats or decals in the tub or shower. If you need to sit down in the shower, use a plastic, non-slip stool. Keep the floor dry. Clean up any water that spills on the floor as soon as it happens. Remove  soap buildup in the tub or shower regularly. Attach bath mats securely with double-sided non-slip rug tape. Do not have throw rugs and other things on the floor that can make you trip. What can I do in the bedroom? Use night lights. Make sure that you have a light by your bed that is easy to reach. Do not use any sheets or blankets that are too big for your bed. They should not hang down onto the floor. Have a firm chair that has side arms. You can use this for support while you get dressed. Do not have throw rugs and other things on the floor that can make you trip. What can I do in the kitchen? Clean up any spills right away. Avoid walking on wet floors. Keep items that you use a lot in easy-to-reach places. If you need to reach something above you, use a strong step stool that has a grab bar. Keep electrical cords out of the way. Do not use floor polish or wax that makes floors slippery. If you must use wax, use non-skid floor wax. Do not have throw rugs and other things on the floor that can make you trip. What can I do with my stairs? Do not  leave any items on the stairs. Make sure that there are handrails on both sides of the stairs and use them. Fix handrails that are broken or loose. Make sure that handrails are as long as the stairways. Check any carpeting to make sure that it is firmly attached to the stairs. Fix any carpet that is loose or worn. Avoid having throw rugs at the top or bottom of the stairs. If you do have throw rugs, attach them to the floor with carpet tape. Make sure that you have a light switch at the top of the stairs and the bottom of the stairs. If you do not have them, ask someone to add them for you. What else can I do to help prevent falls? Wear shoes that: Do not have high heels. Have rubber bottoms. Are comfortable and fit you well. Are closed at the toe. Do not wear sandals. If you use a stepladder: Make sure that it is fully opened. Do not climb a  closed stepladder. Make sure that both sides of the stepladder are locked into place. Ask someone to hold it for you, if possible. Clearly mark and make sure that you can see: Any grab bars or handrails. First and last steps. Where the edge of each step is. Use tools that help you move around (mobility aids) if they are needed. These include: Canes. Walkers. Scooters. Crutches. Turn on the lights when you go into a dark area. Replace any light bulbs as soon as they burn out. Set up your furniture so you have a clear path. Avoid moving your furniture around. If any of your floors are uneven, fix them. If there are any pets around you, be aware of where they are. Review your medicines with your doctor. Some medicines can make you feel dizzy. This can increase your chance of falling. Ask your doctor what other things that you can do to help prevent falls. This information is not intended to replace advice given to you by your health care provider. Make sure you discuss any questions you have with your health care provider. Document Released: 07/20/2009 Document Revised: 02/29/2016 Document Reviewed: 10/28/2014 Elsevier Interactive Patient Education  2017 [employer].

## 2022-09-03 NOTE — Telephone Encounter (Signed)
Refill request for  TRAMADOL HCL 50 MG TAB   LOV - 04/23/22 Next OV - not scheduled Last refill - 05/29/22 #60/2

## 2022-09-04 NOTE — Progress Notes (Signed)
Remote ICD transmission.   

## 2022-09-16 ENCOUNTER — Other Ambulatory Visit: Payer: Self-pay | Admitting: Family Medicine

## 2022-09-16 NOTE — Telephone Encounter (Signed)
Refill request for GABAPENTIN 600 MG TAB   LOV - 04/23/22 Next OV - not scheduled Last refill - in EMR as historical

## 2022-09-24 ENCOUNTER — Telehealth: Payer: Self-pay | Admitting: Pharmacist

## 2022-09-24 NOTE — Telephone Encounter (Signed)
Received fax from [facility] regarding Ozempic application that was faxed last month. They request income documentation to continue processing the application.

## 2022-09-24 NOTE — Telephone Encounter (Signed)
Spoke with patient; she expressed understanding. Patient will bring documentation to the office.  Charlene Brooke, CPP notified  Marijean Niemann, [place] Clinical Pharmacy Assistant [phone]

## 2022-10-01 ENCOUNTER — Other Ambulatory Visit: Payer: Self-pay | Admitting: Family Medicine

## 2022-10-06 ENCOUNTER — Other Ambulatory Visit: Payer: Self-pay | Admitting: Family Medicine

## 2022-10-06 DIAGNOSIS — E1142 Type 2 diabetes mellitus with diabetic polyneuropathy: Secondary | ICD-10-CM

## 2022-10-09 ENCOUNTER — Other Ambulatory Visit: Payer: Self-pay | Admitting: Family Medicine

## 2022-10-09 DIAGNOSIS — E1142 Type 2 diabetes mellitus with diabetic polyneuropathy: Secondary | ICD-10-CM

## 2022-10-10 ENCOUNTER — Telehealth: Payer: Self-pay

## 2022-10-10 NOTE — Progress Notes (Addendum)
Care Management & Coordination Services Pharmacy Team  Reason for Encounter: Appointment Reminder  Contacted patient on 10/10/2022    Medications: Outpatient Encounter Medications as of 10/10/2022  Medication Sig   acetaminophen (TYLENOL) 500 MG tablet Take 2 tablets (1,000 mg total) by mouth every 6 (six) hours as needed for moderate pain or headache.   amiodarone (PACERONE) 200 MG tablet TAKE 1/2 TABLET BY MOUTH EVERY OTHER DAY   atorvastatin (LIPITOR) 10 MG tablet TAKE 1 TABLET BY MOUTH ONCE DAILY   calcitRIOL (ROCALTROL) 0.5 MCG capsule Take 0.5 mcg by mouth every morning.   Calcium Carb-Cholecalciferol (CALCIUM CARBONATE-VITAMIN D3) 600-400 MG-UNIT TABS Take 1 tablet by mouth 2 (two) times a day.   carvedilol (COREG) 3.125 MG tablet TAKE 1 TABLET BY MOUTH TWICE (2) DAILY WITH A MEAL   clopidogrel (PLAVIX) 75 MG tablet TAKE 1 TABLET BY MOUTH ONCE A DAY   Continuous Blood Gluc Receiver (FREESTYLE LIBRE 2 READER) DEVI Use to check sugar multiple times per day as directed.  H/o DM2, treated with insulin.   Continuous Blood Gluc Receiver (FREESTYLE LIBRE 2 READER) DEVI FreeStyle Libre 2 Reader   Continuous Blood Gluc Sensor (FREESTYLE LIBRE 2 SENSOR) MISC USE TO CHECK SUGAR MULTIPLE TIMES PER DAY AS DIRECTED   DULoxetine (CYMBALTA) 30 MG capsule Take 1 capsule (30 mg total) by mouth at bedtime.   gabapentin (NEURONTIN) 300 MG capsule Take 300 mg by mouth 3 (three) times daily.   gabapentin (NEURONTIN) 600 MG tablet TAKE 1 TABLET BY IN THE MORNING AND 2 ATBEDTIME   insulin glargine (LANTUS) 100 UNIT/ML injection Inject 18 Units into the skin at bedtime.   Insulin Pen Needle (PEN NEEDLES) 32G X 6 MM MISC 1 each by Does not apply route 4 (four) times daily.   isosorbide mononitrate (IMDUR) 30 MG 24 hr tablet TAKE 1 TABLET BY MOUTH ONCE DAILY   levothyroxine (SYNTHROID) 50 MCG tablet TAKE 1 TABLET BY MOUTH ONCE DAILY BEFOREBREAKFAST   losartan (COZAAR) 25 MG tablet TAKE 1/2 TABLET BY MOUTH DAILY    methocarbamol (ROBAXIN) 500 MG tablet Take 1 tablet (500 mg total) by mouth 2 (two) times daily.   Multiple Vitamin (MULTIVITAMIN WITH MINERALS) TABS tablet Take 1 tablet by mouth every morning.   nitroGLYCERIN (NITROSTAT) 0.4 MG SL tablet Place 0.4 mg under the tongue every 5 (five) minutes as needed for chest pain.   NOVOLOG FLEXPEN 100 UNIT/ML FlexPen INJECT 0.05 ML (5 UNITS TOTAL) INTO THE SKIN 3 TIMES DAILY BEFORE MEALS   Omega-3 Fatty Acids (FISH OIL) 1000 MG CAPS Take 1,000 mg by mouth 2 (two) times a day.   ONETOUCH ULTRA test strip USE AS INSTRUCTED TO CHECK BLOOD SUGAR 3TIMES DAILY   oxyCODONE (OXY IR/ROXICODONE) 5 MG immediate release tablet Take 1 tablet (5 mg total) by mouth every 4 (four) hours as needed for severe pain.   polyethylene glycol (MIRALAX / GLYCOLAX) 17 g packet Take 17 g by mouth daily as needed for mild constipation.   torsemide (DEMADEX) 20 MG tablet Take 1 tablet (20 mg total) by mouth 2 (two) times daily.   traMADol (ULTRAM) 50 MG tablet TAKE 1 TO 2 TABLET BY MOUTH AT BEDTIME AS NEEDED   No facility-administered encounter medications on file as of 10/10/2022.   Lab Results  Component Value Date/Time   HGBA1C 6.4 (A) 04/23/2022 04:10 PM   HGBA1C 7.7 (H) 01/15/2022 03:49 PM   HGBA1C 8.1 (H) 10/03/2021 11:37 AM   MICROALBUR 6.9 08/06/2016 02:32 PM  MICROALBUR 10.0 12/12/2015 03:48 PM    BP Readings from Last 3 Encounters:  07/09/22 (!) 100/53  06/11/22 132/61  05/08/22 (!) 133/53    Unsuccessful attempt to reach patient. Left patient message reminding patient of appointment.  Patient contacted to confirm telephone appointment with Charlene Brooke,   PharmD, on 10/11/2022 at 1:45.  Clopidogrel '75mg'$  10/08/2022 [facility]:  Medication:  Last Fill: Day Supply Atorvastatin '10mg'$  10/01/22 30 Losartan '25mg'$  06/24/22 90 Lantus   05/15/22  68 Novolog  05/21/22 100 Ozempic manufacturer  Care Gaps: Annual wellness visit in last year? Yes  If  Diabetic: Last eye exam / retinopathy screening:UTD Last diabetic foot exam:UTD  Charlene Brooke, CPP notified  Avel Sensor, [place]  [phone]

## 2022-10-11 ENCOUNTER — Telehealth: Payer: HMO

## 2022-10-11 ENCOUNTER — Ambulatory Visit: Payer: HMO | Admitting: Pharmacist

## 2022-10-11 ENCOUNTER — Telehealth: Payer: Self-pay | Admitting: Pharmacist

## 2022-10-11 NOTE — Telephone Encounter (Signed)
Pt is in process of applying for patient assistance to restart Ozempic - we are aiming to reduce insulin use to prevent hypoglycemia. It will likely take another month to get PAP approved and Ozempic shipped to office. Now that is is January, her insurance has reset and Ozempic will be free at the pharmacy in the meantime, in the interest of starting Ozempic as soon as possible, I recommend ordering Ozempic to the pharmacy until PAP is approved.  Currently she is taking Lantus 12 units and Novolog 8-16 units per sliding scale. She reports glucose has been higher lately during the holidays, typically 150-250. She denies any recent lows.  -Recommend Ozempic 0.25 mg weekly x 4 weeks, then increase to 0.5 mg weekly -Advised pt to monitor glucose very closely (she has [employer]), advised to continue insulin as prescribed for now, anticipate she will be able to stop Novolog and reduce Lantus with titrating Ozempic up.

## 2022-10-11 NOTE — Progress Notes (Signed)
Care Management & Coordination Services Pharmacy Note  10/11/2022 Name:  [name] MRN:  [id number] DOB:  06/15/1956  Summary: -DM: A1c 6.4% (04/2022); Last visit we agreed to restart Ozempic through PAP which has not yet been approved. -Home BG (via [employer] - uses reader): pt reports glucose has been "higher" over the holidays, 150-250 throughout the day; she denies any recent low gluose   Recommendations: -Recommend to restart Ozempic 0.25 mg weekly x 4 weeks- fill at pharmacy in January and await PAP approval for further refills; Hopefully can reduce Lantus and D/C Novolog when starting Ozempic  Follow up plan: -Health Concierge will call patient 2 weeks for DM update -Pharmacist follow up televisit scheduled for 1 month -PCP appt: none scheduled    Subjective: [name] is an 67 y.o. year old female who is a primary patient of Para March, Dwana Curd, MD.  The care coordination team was consulted for assistance with disease management and care coordination needs.    Engaged with patient by telephone for follow up visit.  Patient Care Team: Joaquim Nam, MD as PCP - General (Family Medicine) Antonieta Iba, MD as PCP - Cardiology (Cardiology) Duke Salvia, MD as PCP - Electrophysiology (Cardiology) Bensimhon, Bevelyn Buckles, MD as PCP - Advanced Heart Failure (Cardiology) Nelson Chimes, MD as Consulting Physician (Ophthalmology) Lamont Dowdy, MD as Consulting Physician (Internal Medicine) Orland Jarred, Blanchie Serve (Inactive) as Attending Physician (Podiatry) Lynn Ito, MD as Consulting Physician (Infectious Diseases) Glendale Chard, DO as Consulting Physician (Neurology) Kathyrn Sheriff, [facility] as Pharmacist (Pharmacist) Rickard Patience, MD as Consulting Physician (Oncology)   Recent office visits: 04/23/22 Dr Para March OV: A1c 6.4. Stay off Ozempic (ran out/high cost).  If AM if below 100, then cut 1 unit of insulin a day.  If above 150, then add 1  unit.  If 100-150, then no change.     01/30/22 repeat BMP - Cr improved. Repeat BMP 1 month. 01/15/22 Dr Para March OV: f/u CKD. Updated amiodarone 200 mg to 1/2 tab QOD, ferrous sulfate to QOD, Gabapentin to 300 mg daily, Novolog to SSI. Cr significnatly worse - advised to hold losartan and torsemide x 2 days, then resume. Repeat BMP 1 week.   09/24/21 Dr Para March VV (telephone): advised chekcing pre and post-meal sugars. Rec CGM. Labs ordered (CMP, CBC, A1C, lipids)   02/15/21 Para March (PCP) [facility] f/u. Osteomyelitis. Reduce Gabapentin 600 mg HS & start Novolog sliding scale. Advised to restart torsemide 20 mg daily PRN leg swelling. Recheck BMP/K in 1 week.  Recent consult visits: 04/05/22 Dr Gala Romney (HF clinic): no changes.   03/06/22 Dr Cherylann Ratel (Nephrology): f/u CKD. GFR 23. A1c 7.7%. Hold Marcelline Deist due to low GFR.   01/03/22 Dr Edilia Bo (Vascular): new pt - PAD. S/p L leg balloon angioplasty. Preop eval for TKA today. Will require aggressive DVT ppx post-op.   11/20/21 Dr Graciela Husbands (Cardiology): f/u NICM, CHF. Decrease amiodarone to 200 mg 5x per week. Cr 2.18, GFR 24 - requires eval per PCP.   11/14/21 NP Prince Rome (HF clinic): f/u HF. Labs stable. Ordered ABI, ECHO. No med changes.  Hospital visits: 05/07/22 admission for TKA.   10/12/21 ED visit [facility]): CHF exacerbation. IV lasix in ED, temporarily increased torsemide for 5 days.   Admitted 02/2021 for cellulitis. She underwent amputation of left great toe 5/27.    Admitted 01/2021 for osteomyelitis of left foot after failing outpatient management. Had initial surgery (I&D, bone biopsy, and sesamoidectomy)  Objective:  Lab Results  Component Value Date   CREATININE 2.08 (H) 05/08/2022   BUN 41 (H) 05/08/2022   GFR 23.72 (L) 01/30/2022   EGFR 24 (L) 11/20/2021   GFRNONAA 25 (L) 05/08/2022   GFRAA 23 (L) 11/05/2019   NA 137 05/08/2022   K 5.1 05/08/2022   CALCIUM 8.8 (L) 05/08/2022   CO2 27 05/08/2022   GLUCOSE 389 (H) 05/08/2022     Lab Results  Component Value Date/Time   HGBA1C 6.4 (A) 04/23/2022 04:10 PM   HGBA1C 7.7 (H) 01/15/2022 03:49 PM   HGBA1C 8.1 (H) 10/03/2021 11:37 AM   FRUCTOSAMINE 305 (H) 12/24/2018 05:53 PM   GFR 23.72 (L) 01/30/2022 12:43 PM   GFR 15.31 (L) 01/15/2022 03:49 PM   MICROALBUR 6.9 08/06/2016 02:32 PM   MICROALBUR 10.0 12/12/2015 03:48 PM    Last diabetic Eye exam:  Lab Results  Component Value Date/Time   HMDIABEYEEXA No Retinopathy 08/15/2022 12:00 AM    Last diabetic Foot exam: No results found for: "HMDIABFOOTEX"   Lab Results  Component Value Date   CHOL 96 10/03/2021   HDL 38.90 (L) 10/03/2021   LDLCALC 43 10/03/2021   TRIG 70.0 10/03/2021   CHOLHDL 2 10/03/2021       Latest Ref Rng & Units 04/10/2022   11:55 AM 10/03/2021   11:37 AM 03/03/2021    1:31 AM  Hepatic Function  Total Protein 6.5 - 8.1 g/dL 7.3  6.4  6.1   Albumin 3.5 - 5.0 g/dL 3.7  3.2  3.0   AST 15 - 41 U/L 19  10  16    ALT 0 - 44 U/L 15  7  14    Alk Phosphatase 38 - 126 U/L 76  82  55   Total Bilirubin 0.3 - 1.2 mg/dL 0.4  0.3  0.4     Lab Results  Component Value Date/Time   TSH 1.120 11/20/2021 12:32 PM   TSH 1.160 03/27/2021 12:47 PM   FREET4 1.19 03/22/2019 11:59 AM   FREET4 1.1 04/16/2016 04:50 PM       Latest Ref Rng & Units 05/08/2022    2:40 AM 04/10/2022   11:55 AM 01/15/2022    3:49 PM  CBC  WBC 4.0 - 10.5 K/uL 7.6  7.5  7.3   Hemoglobin 12.0 - 15.0 g/dL 9.4  72.5  9.6   Hematocrit 36.0 - 46.0 % 29.5  35.0  29.3   Platelets 150 - 400 K/uL 174  210  220.0     Lab Results  Component Value Date/Time   VD25OH 32 06/01/2012 10:00 AM   VITAMINB12 490 04/10/2022 11:56 AM   VITAMINB12 415 11/22/2018 06:50 PM    Clinical ASCVD: Yes  The ASCVD Risk score (Arnett DK, et al., 2019) failed to calculate for the following reasons:   The patient has a prior MI or stroke diagnosis       09/02/2022   10:56 AM 03/30/2021   10:46 AM 03/21/2021    9:24 AM  Depression screen PHQ 2/9   Decreased Interest 0 0 0  Down, Depressed, Hopeless 0  0  PHQ - 2 Score 0 0 0  Altered sleeping   0  Tired, decreased energy   0  Change in appetite   0  Feeling bad or failure about yourself    0  Trouble concentrating   0  Moving slowly or fidgety/restless   0  Suicidal thoughts   0  PHQ-9 Score  0  Difficult doing work/chores   Not difficult at all    CHA2DS2/VAS Stroke Risk Points  Current as of 11 minutes ago     6 >= 2 Points: High Risk  1 - 1.99 Points: Medium Risk  0 Points: Low Risk    Last Change: N/A         Points Metrics  1 Has Congestive Heart Failure:  Yes    Current as of 11 minutes ago  1 Has Vascular Disease:  Yes    Current as of 11 minutes ago  1 Has Hypertension:  Yes    Current as of 11 minutes ago  1 Age:  42    Current as of 11 minutes ago  1 Has Diabetes:  Yes    Current as of 11 minutes ago  0 Had Stroke:  No  Had TIA:  No  Had Thromboembolism:  No    Current as of 11 minutes ago  1 Female:  Yes    Current as of 11 minutes ago     Social History   Tobacco Use  Smoking Status Never  Smokeless Tobacco Never   BP Readings from Last 3 Encounters:  07/09/22 (!) 100/53  06/11/22 132/61  05/08/22 (!) 133/53   Pulse Readings from Last 3 Encounters:  07/09/22 62  06/11/22 68  05/08/22 60   Wt Readings from Last 3 Encounters:  09/02/22 268 lb (121.6 kg)  07/09/22 268 lb (121.6 kg)  06/11/22 258 lb (117 kg)   BMI Readings from Last 3 Encounters:  09/02/22 40.75 kg/m  07/09/22 40.75 kg/m  06/11/22 40.41 kg/m    Allergies  Allergen Reactions   Heparin Other (See Comments)    Can not remember reaction   Nsaids Other (See Comments)    Kidney disease    Medications Reviewed Today     Reviewed by Tillie Rung, LPN (Licensed Practical Nurse) on 09/02/22 at 1046  Med List Status: <None>   Medication Order Taking? Sig Documenting Provider Last Dose Status Informant  acetaminophen (TYLENOL) 500 MG tablet [id number] No Take 2  tablets (1,000 mg total) by mouth every 6 (six) hours as needed for moderate pain or headache. Cassandria Anger, PA-C Taking Active   amiodarone (PACERONE) 200 MG tablet [id number] No TAKE 1/2 TABLET BY MOUTH EVERY OTHER DAY Joaquim Nam, MD Taking Active Self  atorvastatin (LIPITOR) 10 MG tablet [id number] No TAKE 1 TABLET BY MOUTH ONCE DAILY Joaquim Nam, MD Taking Active Self  calcitRIOL (ROCALTROL) 0.5 MCG capsule [id number] No Take 0.5 mcg by mouth every morning. [provider] Taking Active Self  Calcium Carb-Cholecalciferol (CALCIUM CARBONATE-VITAMIN D3) 600-400 MG-UNIT TABS [id number] No Take 1 tablet by mouth 2 (two) times a day. [provider] Taking Active Self  carvedilol (COREG) 3.125 MG tablet [id number] No TAKE 1 TABLET BY MOUTH TWICE (2) DAILY WITH A MEAL Joaquim Nam, MD Taking Active   clopidogrel (PLAVIX) 75 MG tablet [id number] No TAKE 1 TABLET BY MOUTH ONCE A DAY Duke Salvia, MD Taking Active   Continuous Blood Gluc Receiver (FREESTYLE LIBRE 2 READER) DEVI [id number] No Use to check sugar multiple times per day as directed.  H/o DM2, treated with insulin. Joaquim Nam, MD Taking Active Self  Continuous Blood Gluc Receiver (FREESTYLE LIBRE 2 READER) DEVI [id number] No FreeStyle Libre 2 Reader [provider] Taking Active Self  DULoxetine (CYMBALTA) 30 MG capsule [id number] No Take 1 capsule (30 mg total) by mouth  at bedtime. Nita Sickle K, DO Taking Active   gabapentin (NEURONTIN) 300 MG capsule [id number] No Take 300 mg by mouth 3 (three) times daily. [provider] Taking Active   insulin glargine (LANTUS) 100 UNIT/ML injection [id number] No Inject 18 Units into the skin at bedtime. [provider] Taking Active Self           Med Note (GARNER, TIFFANY L   Tue Jul 09, 2022 12:06 PM)    Insulin Pen Needle (PEN NEEDLES) 32G X 6 MM MISC [id number] No 1 each by Does not apply route 4 (four) times daily. Lezlie Lye, Meda Coffee,  MD Taking Active Self  isosorbide mononitrate (IMDUR) 30 MG 24 hr tablet [id number] No TAKE 1 TABLET BY MOUTH ONCE DAILY Bensimhon, Bevelyn Buckles, MD Taking Active   levothyroxine (SYNTHROID) 50 MCG tablet [id number] No TAKE 1 TABLET BY MOUTH ONCE DAILY BEFOREBREAKFAST Joaquim Nam, MD Taking Active   losartan (COZAAR) 25 MG tablet [id number] No TAKE 1/2 TABLET BY MOUTH DAILY Bensimhon, Bevelyn Buckles, MD Taking Active Self  methocarbamol (ROBAXIN) 500 MG tablet [id number] No Take 1 tablet (500 mg total) by mouth 2 (two) times daily. Sponseller, Eugene Gavia, PA-C Taking Active   Multiple Vitamin (MULTIVITAMIN WITH MINERALS) TABS tablet [id number] No Take 1 tablet by mouth every morning. [provider] Taking Active Self  nitroGLYCERIN (NITROSTAT) 0.4 MG SL tablet [id number] No Place 0.4 mg under the tongue every 5 (five) minutes as needed for chest pain. [provider] Taking Active   NOVOLOG FLEXPEN 100 UNIT/ML FlexPen [id number]  INJECT 0.05 ML (5 UNITS TOTAL) INTO THE SKIN 3 TIMES DAILY BEFORE MEALS Joaquim Nam, MD  Active   Omega-3 Fatty Acids (FISH OIL) 1000 MG CAPS [id number] No Take 1,000 mg by mouth 2 (two) times a day. [provider] Taking Active Self  Koren Bound test strip [id number] No USE AS INSTRUCTED TO CHECK BLOOD SUGAR 3TIMES DAILY Joaquim Nam, MD Taking Active   oxyCODONE (OXY IR/ROXICODONE) 5 MG immediate release tablet [id number] No Take 1 tablet (5 mg total) by mouth every 4 (four) hours as needed for severe pain. Cassandria Anger, PA-C Taking Active   polyethylene glycol (MIRALAX / GLYCOLAX) 17 g packet [id number] No Take 17 g by mouth daily as needed for mild constipation. Cassandria Anger, PA-C Taking Active   torsemide (DEMADEX) 20 MG tablet [id number] No Take 1 tablet (20 mg total) by mouth 2 (two) times daily. Bensimhon, Bevelyn Buckles, MD Taking Expired 07/28/22 2359   traMADol (ULTRAM) 50 MG tablet [id number] No Take 1-2 tablets (50-100 mg total) by mouth  at bedtime as needed (don't take with oxycodone.). Joaquim Nam, MD Taking Active             Patient Active Problem List   Diagnosis Date Noted   History of myocardial infarction 07/05/2022   Spondylosis without myelopathy 07/05/2022   Lumbar radiculopathy 06/18/2022   S/P total knee arthroplasty, right 05/07/2022   Knee pain 04/24/2022   Localized osteoarthritis of knees, bilateral 07/25/2021   Medication monitoring encounter 03/31/2021   Osteomyelitis (HCC) 02/18/2021   CKD (chronic kidney disease) stage 4, GFR 15-29 ml/min (HCC) 01/28/2021   AICD (automatic cardioverter/defibrillator) present 01/28/2021   Diabetic foot ulcer (HCC) 11/24/2020   Advance care planning 10/04/2020   Secondary hyperparathyroidism of renal origin (HCC) 07/20/2019   Hyperkalemia 07/20/2019   Benign hypertensive kidney disease with chronic kidney disease 07/20/2019   Anemia in chronic kidney disease  07/20/2019   Positive antinuclear antibody 07/20/2019   Proteinuria 07/20/2019   Acute on chronic systolic (congestive) heart failure (HCC) 03/23/2019   UTI (urinary tract infection) 03/09/2019   CAD (coronary artery disease) 03/09/2019   DM type 2, uncontrolled, with renal complications 03/09/2019   Atrial tachycardia    Acute kidney injury superimposed on chronic kidney disease (HCC)    AKI (acute kidney injury) (HCC)    Weakness generalized 12/22/2018   Shortness of breath 12/22/2018   Palliative care encounter 12/22/2018   Endocarditis 12/14/2018   NSTEMI (non-ST elevated myocardial infarction) (HCC) 11/22/2018   Acute on chronic combined systolic and diastolic CHF (congestive heart failure) (HCC) 10/28/2018   Lymphedema 10/28/2018   Severe sepsis (HCC) 09/28/2018   History of amputation of lesser toe of right foot (HCC) 04/19/2018   Presence of cardiac resynchronization therapy defibrillator (CRT-D) 04/19/2018   Paraparesis of both lower limbs (HCC) 03/29/2018   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/2018   Paroxysmal atrial fibrillation (HCC) 03/29/2018   Class 3 obesity due to excess calories with serious comorbidity and body mass index (BMI) of 40.0 to 44.9 in adult 09/02/2016   History of CVA (cerebrovascular accident) 11/11/2015   Chronic renal insufficiency 08/04/2015   OSA (obstructive sleep apnea) 08/15/2014   Morbid obesity (HCC) 10/26/2013   Essential hypertension, benign 04/29/2013   Pure hypercholesterolemia 12/07/2012   Iron deficiency anemia 12/07/2012   Diabetic peripheral neuropathy associated with type 2 diabetes mellitus (HCC) 12/07/2012   Chronic systolic congestive heart failure (HCC) 07/29/2012   Left bundle-branch block 07/25/2012   Coronary artery disease involving native coronary artery of native heart without angina pectoris 07/25/2012   Heart failure (HCC) 07/21/2012   Hyperlipidemia 07/21/2012    Immunization History  Administered Date(s) Administered   Fluad Quad(high Dose 65+) 10/03/2021, 08/08/2022   Hepatitis B 03/16/2013   Hepatitis B, adult 03/30/2014, 07/18/2014   Influenza Split 07/21/2012, 07/03/2020   Influenza, High Dose Seasonal PF 07/01/2019   Influenza,inj,Quad PF,6+ Mos 06/16/2013, 07/07/2014, 06/18/2015, 08/06/2016, 07/20/2018   Influenza-Unspecified 07/23/2017, 07/22/2018, 06/11/2019   PFIZER Comirnaty(Gray Top)Covid-19 Tri-Sucrose Vaccine 06/21/2021   PFIZER(Purple Top)SARS-COV-2 Vaccination 11/21/2019, 12/21/2019, 07/19/2020   PNEUMOCOCCAL CONJUGATE-20 06/21/2021   Pneumococcal Polysaccharide-23 08/08/2011, 06/18/2015, 08/13/2016   Tdap 03/07/2010, 05/03/2016   Zoster, Live 04/06/2014     Compliance/Adherence/Medication fill history: Care Gaps: None  Star-Rating Drugs: Atorvastatin - PDC 49%; LF 06/25/22 x 30 ds --checking with pharmacy to make sure fill dates are correct; pt endorses compliance as prescribed Losartan - PDC 87% Ozempic (PAP - pending)   SDOH:  (Social Determinants of Health) assessments and  interventions performed: No SDOH Interventions    Flowsheet Row Clinical Support from 09/02/2022 in [facility] Clinical Support from 03/21/2021 in [facility] [place]  SDOH Interventions    Food Insecurity Interventions Intervention Not Indicated --  Housing Interventions Intervention Not Indicated --  Transportation Interventions Intervention Not Indicated --  Utilities Interventions Intervention Not Indicated --  Alcohol Usage Interventions Intervention Not Indicated (Score <7) --  Depression Interventions/Treatment  -- PHQ2-9 Score <4 Follow-up Not Indicated  Financial Strain Interventions Intervention Not Indicated --  Physical Activity Interventions Intervention Not Indicated --  Stress Interventions Intervention Not Indicated --  Social Connections Interventions Intervention Not Indicated --      SDOH Screenings   Food Insecurity: No Food Insecurity (09/02/2022)  Housing: Low Risk  (09/02/2022)  Transportation Needs: No Transportation Needs (09/02/2022)  Utilities: Not At Risk (09/02/2022)  Alcohol Screen: Low  Risk  (09/02/2022)  Depression (PHQ2-9): Low Risk  (09/02/2022)  Financial Resource Strain: Low Risk  (09/02/2022)  Physical Activity: Inactive (09/02/2022)  Social Connections: Socially Isolated (09/02/2022)  Stress: No Stress Concern Present (09/02/2022)  Tobacco Use: Low Risk  (09/02/2022)    Medication Assistance:  Ozempic - Novo Cares PAP pending for 2024  Medication Access: Within the past 30 days, how often has patient missed a dose of medication? 0 Is a pillbox or other method used to improve adherence? Yes  Factors that may affect medication adherence? financial need Are meds synced by current pharmacy? No  Are meds delivered by current pharmacy? No  Does patient experience delays in picking up medications due to transportation concerns? No   [facility] Services Reviewed: Is patient disadvantaged to use [facility]  Pharmacy?: Yes  Current Rx insurance plan: HTA Name and location of Current pharmacy:  [facility] - [place], [place] - [address] [place] [address] Phone: [phone] Fax: [phone]  [facility] - [place], [place] - [address] [place] [address] Phone: [phone] Fax: [phone]  [facility] Pharmacy services reviewed with patient today?: No  Patient requests to transfer care to [facility] Pharmacy?: No  Reason patient declined to change pharmacies: Disadvantaged due to insurance/mail order   Assessment/Plan   Hyperlipidemia: (LDL goal < 70) -Controlled - LDL 43 (09/2021) at goal; pt reports compliance with medications as prescribed; she endorses some bleeding issues - when she nicks herself it takes longer to stop bleeding, she denies blood in urine/stool -Hx CAD - MI 07/2012 w/ stent; Hx stroke -Current treatment: Atorvastatin 10 mg daily - Appropriate, Effective, Safe, Accessible Isosorbide MN 30 mg daily - Appropriate, Effective, Safe, Accessible Nitroglycerin 0.4 mg SL prn - Appropriate, Effective, Safe, Accessible Clopidogrel 75 mg daily HS - Appropriate, Effective, Safe, Accessible Aspirin 81 mg daily - Appropriate, Effective, Safe, Accessible -Educated on Cholesterol goals; Benefits of statin for ASCVD risk reduction; -Counseled on benefits of clopidogrel/aspirin for stent patency; discussed nuisance bleeding vs major bleeding; counseled on signs/symptoms of major bleeding and when to seek emergency care -Recommended to continue current medication   Diabetes (A1c goal <7%) -Controlled - A1c 6.4% (04/2022) at goal, though pt was on Ozempic earlier in 2023 but has been off it since April/May due to high cost in donut hole. Last visit we agreed to restart Ozempic through PAP which has not yet been approved. -Pt eats 2 meals per day -Pt reports rare hypoglycemia; she uses  peanut butter for hypoglycemic episodes -Home BG (via [employer] - uses reader): pt reports glucose has been "higher" over the holidays, 150-250 throughout the day; she denies any recent low gluose -Hx toe amputation, osteomyelitis -Current medications: Lantus 12 units daily AM-Appropriate, Effective, Query Safe Novolog 8-16 units TID per sliding scale - usually BID -Appropriate, Effective, Safe, Accessible Freestyle Libre 2 (w/ reader) - Appropriate, Effective, Safe, Accessible -Medications previously tried: Trulicity, glimepiride, Jardiance, metformin, Januvia, Ozempic (cost in donut hole), Farxiga (low GFR) -Recommend to restart Ozempic 0.25 mg weekly x 4 weeks- fill at pharmacy in January and await PAP approval for further refills   Atrial Tachyardia/NSVT/AFIB (Goal: prevent stroke and major bleeding) -Controlled - pt reports palpitations have improved since being on amiodarone -Clarified Afib dx previously - pt had 1 hr of SCAF on device, no repeat events; cardiology has not recommended anticoagulation -CHADSVASC: 8; per chart review patient has never been on anticoagulation for Afib (was briefly on  Eliquis for a DVT in 2016); she is currently on DAPT for hx CAD w/ stent -Current treatment: Amiodarone 200 mg - 1/2 tab QOD Appropriate, Effective, Safe, Accessible Carvedilol 3.125 mg AM - Appropriate, Effective, Safe, Accessible -Medications previously tried: Eliquis (for DVT, 2016) -Counseled on benefits of rate/rhythm control drugs to prevent tachycardia -Recommended to continue current medication   Heart Failure (Goal: manage symptoms and prevent exacerbations) -Controlled - pt reports swelling is under control with 40 mg of torsemide/day; her kidney function deteriorated with 60 mg of torsemide previously but is now stable -Current home BP/HR readings: 99/61 today -Last ejection fraction: 50-55% (Date: 11/26/21); previously 10-15% in 2020 -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%); NYHA Class: I-II -Current treatment: Carvedilol 3.125 mg BID -Appropriate, Effective, Safe, Accessible Isosorbide MN 30 mg daily -Appropriate, Effective, Safe, Accessible Losartan 25 mg - 1/2 tab daily -Appropriate, Effective, Safe, Accessible Torsemide 20 mg - 2 tab daily-Appropriate, Effective, Query Safe -Medications previously tried: Jardiance 10 mg -Educated on Benefits of medications for managing symptoms and prolonging life; Importance of blood pressure control -Reviewed low BP reading at home - discussed hydration, cutting back on meds - advised to take 1/2 of isosorbide dose daily and continue monitoring BP   CKD stage 4 / HyperPTH (Goal: prevent progression) -Query controlled -  -Hx AKI 01/2022 on torsemide/losartan, improved with holding medication; she has since restarted losartan at 1/2 dose -Pt reports she is overdue for nephrology appt - they have declined to refill calcitriol until she makes an appt; per pt she needs fire dept to help her down the steps so consolidating lab/doctor appts would be helpful -All medications assessed for renal dosing and appropriateness in chronic kidney disease. -Current treatment  Calcitriol 0.5 mg daily AM -Appropriate, Effective, Safe, Accessible Ferrous sulfate 325 mg BID -Appropriate, Effective, Safe, Accessible Losartan 25 mg - 1/2 tab daily -Appropriate, Effective, Safe, Accessible -Counseled on maintaining adequate hydration; importance of BP and DM control to prevent CKD progression; avoidance of NSAIDs and other nephrotoxins -Recommended to continue current medication   Health Maintenance -Vaccine gaps: Shingrix, covid booster, flu    Al Corpus, PharmD, BCACP Clinical Pharmacist [name] [phone]

## 2022-10-13 MED ORDER — OZEMPIC (0.25 OR 0.5 MG/DOSE) 2 MG/3ML ~~LOC~~ SOPN: PEN_INJECTOR | SUBCUTANEOUS | 3 refills | Status: DC

## 2022-10-13 NOTE — Telephone Encounter (Signed)
Sent. Thanks.   

## 2022-10-14 ENCOUNTER — Other Ambulatory Visit: Payer: Self-pay | Admitting: Family Medicine

## 2022-10-14 DIAGNOSIS — Z89422 Acquired absence of other left toe(s): Secondary | ICD-10-CM | POA: Diagnosis not present

## 2022-10-14 DIAGNOSIS — N184 Chronic kidney disease, stage 4 (severe): Secondary | ICD-10-CM | POA: Diagnosis not present

## 2022-10-14 DIAGNOSIS — E113591 Type 2 diabetes mellitus with proliferative diabetic retinopathy without macular edema, right eye: Secondary | ICD-10-CM | POA: Diagnosis not present

## 2022-10-14 DIAGNOSIS — Z89412 Acquired absence of left great toe: Secondary | ICD-10-CM | POA: Diagnosis not present

## 2022-10-14 DIAGNOSIS — Z794 Long term (current) use of insulin: Secondary | ICD-10-CM | POA: Diagnosis not present

## 2022-10-14 DIAGNOSIS — Z1231 Encounter for screening mammogram for malignant neoplasm of breast: Secondary | ICD-10-CM

## 2022-10-14 DIAGNOSIS — J449 Chronic obstructive pulmonary disease, unspecified: Secondary | ICD-10-CM | POA: Diagnosis not present

## 2022-10-14 DIAGNOSIS — D692 Other nonthrombocytopenic purpura: Secondary | ICD-10-CM | POA: Diagnosis not present

## 2022-10-14 DIAGNOSIS — E1122 Type 2 diabetes mellitus with diabetic chronic kidney disease: Secondary | ICD-10-CM | POA: Diagnosis not present

## 2022-10-14 NOTE — Patient Instructions (Signed)
Visit Information  Phone number for Pharmacist: [phone]  Thank you for meeting with me to discuss your medications! Below is a summary of what we talked about during the visit:    Recommendations: -restart Ozempic 0.25 mg weekly x 4 weeks- fill at pharmacy in January and await PAP approval for further refills; Hopefully can reduce Lantus and D/C Novolog when starting Ozempic  Follow up plan: -Health Concierge will call patient 2 weeks for DM update -Pharmacist follow up televisit scheduled for 1 month -PCP appt: none scheduled   Charlene Brooke, PharmD, BCACP Clinical Pharmacist [facility] [phone]

## 2022-10-16 NOTE — Progress Notes (Signed)
Fax received for medical clearance/medication hold for Dr. Scot Dock.  Provider signed, form faxed back to sender, verified successful, sent to scan center.

## 2022-10-17 DIAGNOSIS — H40013 Open angle with borderline findings, low risk, bilateral: Secondary | ICD-10-CM | POA: Diagnosis not present

## 2022-10-21 DIAGNOSIS — I10 Essential (primary) hypertension: Secondary | ICD-10-CM | POA: Diagnosis not present

## 2022-10-21 DIAGNOSIS — N2581 Secondary hyperparathyroidism of renal origin: Secondary | ICD-10-CM | POA: Diagnosis not present

## 2022-10-21 DIAGNOSIS — D631 Anemia in chronic kidney disease: Secondary | ICD-10-CM | POA: Diagnosis not present

## 2022-10-21 DIAGNOSIS — N184 Chronic kidney disease, stage 4 (severe): Secondary | ICD-10-CM | POA: Diagnosis not present

## 2022-10-28 ENCOUNTER — Encounter: Payer: Self-pay | Admitting: Nurse Practitioner

## 2022-10-28 ENCOUNTER — Telehealth: Payer: Self-pay

## 2022-10-28 ENCOUNTER — Ambulatory Visit (INDEPENDENT_AMBULATORY_CARE_PROVIDER_SITE_OTHER): Payer: HMO | Admitting: Nurse Practitioner

## 2022-10-28 VITALS — BP 128/68 | HR 75 | Ht 68.0 in | Wt 267.0 lb

## 2022-10-28 DIAGNOSIS — W548XXA Other contact with dog, initial encounter: Secondary | ICD-10-CM | POA: Diagnosis not present

## 2022-10-28 DIAGNOSIS — L853 Xerosis cutis: Secondary | ICD-10-CM | POA: Insufficient documentation

## 2022-10-28 NOTE — Progress Notes (Signed)
Called patient; no answer; left message.  Lindsey Foltanski, PharmD notified  Ansen Sayegh, RMA Clinical Pharmacy Assistant [phone]   

## 2022-10-28 NOTE — Patient Instructions (Signed)
For the dog scratch you can continue to use the polysporin daily and cover it with a bandaid. You can clean it with soap and water daily  The spot on the foot. Clean with soap and water keep a band aid on it while up and about. At night use vasoline to hydrate the skin and help it heal  I would update your tetanus vaccine at the local pharmacy

## 2022-10-28 NOTE — Assessment & Plan Note (Signed)
Dry skin that has cracked and bleeding some.  No signs of infection.  Told patient to keep it covered while up and about on her feet.  Clean with soap and water daily and use Vaseline at night around the area and he will try to reach her hydrate skin.  See clinical photos

## 2022-10-28 NOTE — Assessment & Plan Note (Signed)
Happened approximately 2 weeks ago.  Incision seems to be healing well she is having some bloody discharge but no signs of infection.  Using Polysporin over-the-counter cleaning with soap and water keep covered with a Band-Aid.  Did recommend patient update her tetanus shot at local pharmacy

## 2022-10-28 NOTE — Telephone Encounter (Signed)
-----  Message from [place], [facility] sent at 10/14/2022  2:56 PM EST ----- Regarding: DM update Please call for BG update - she should have started Ozempic 2 weeks ago. Any lows?

## 2022-10-28 NOTE — Progress Notes (Signed)
   Acute Office Visit  Subjective:     Patient ID: [name], female    DOB: 1956/09/17, 67 y.o.   MRN: [id number]  Chief Complaint  Patient presents with   Foot Pain    Let foot, callous on bottom of foot and heel, cracked open and painful/bleeding     Patient is in today for left foot  States that she had a callus on the left lower foot. States that it was trimmed off. States that Sunday she noticed bleeding. [facility] [place] [facility] Jacqualyn Posey. States last visit over 2 years.  Does have neuropathy and decreased   Review of Systems  Constitutional:  Negative for chills and fever.  Skin:        callus        Objective:    BP 128/68   Pulse 75   Ht '5\' 8"'$  (1.727 m)   Wt 267 lb (121.1 kg)   SpO2 99%   BMI 40.60 kg/m    Physical Exam Vitals and nursing note reviewed.  Constitutional:      Appearance: Normal appearance. She is obese.  Cardiovascular:     Rate and Rhythm: Normal rate and regular rhythm.     Heart sounds: Normal heart sounds.  Pulmonary:     Effort: Pulmonary effort is normal.     Breath sounds: Normal breath sounds.  Musculoskeletal:       Feet:  Feet:     Comments: Missing left great and second toe. Skin:      Neurological:     Mental Status: She is alert.        No results found for any visits on 10/28/22.      Assessment & Plan:   Problem List Items Addressed This Visit       Musculoskeletal and Integument   Dog scratch    Happened approximately 2 weeks ago.  Incision seems to be healing well she is having some bloody discharge but no signs of infection.  Using Polysporin over-the-counter cleaning with soap and water keep covered with a Band-Aid.  Did recommend patient update her tetanus shot at local pharmacy      Dry skin - Primary    Dry skin that has cracked and bleeding some.  No signs of infection.  Told patient to keep it covered while up and about on her feet.  Clean with soap and water daily and use  Vaseline at night around the area and he will try to reach her hydrate skin.  See clinical photos       No orders of the defined types were placed in this encounter.   Return if symptoms worsen or fail to improve.  Romilda Garret, NP

## 2022-10-29 ENCOUNTER — Other Ambulatory Visit: Payer: Self-pay | Admitting: Family Medicine

## 2022-10-29 DIAGNOSIS — I10 Essential (primary) hypertension: Secondary | ICD-10-CM | POA: Diagnosis not present

## 2022-10-29 DIAGNOSIS — I129 Hypertensive chronic kidney disease with stage 1 through stage 4 chronic kidney disease, or unspecified chronic kidney disease: Secondary | ICD-10-CM | POA: Diagnosis not present

## 2022-10-29 DIAGNOSIS — E875 Hyperkalemia: Secondary | ICD-10-CM | POA: Diagnosis not present

## 2022-10-29 DIAGNOSIS — N184 Chronic kidney disease, stage 4 (severe): Secondary | ICD-10-CM | POA: Diagnosis not present

## 2022-10-29 DIAGNOSIS — R809 Proteinuria, unspecified: Secondary | ICD-10-CM | POA: Diagnosis not present

## 2022-10-29 DIAGNOSIS — D631 Anemia in chronic kidney disease: Secondary | ICD-10-CM | POA: Diagnosis not present

## 2022-10-29 DIAGNOSIS — E1122 Type 2 diabetes mellitus with diabetic chronic kidney disease: Secondary | ICD-10-CM | POA: Diagnosis not present

## 2022-10-29 DIAGNOSIS — N2581 Secondary hyperparathyroidism of renal origin: Secondary | ICD-10-CM | POA: Diagnosis not present

## 2022-10-30 ENCOUNTER — Telehealth: Payer: Self-pay

## 2022-10-30 NOTE — Telephone Encounter (Signed)
-----  Message from [place], [facility] sent at 10/14/2022  2:56 PM EST ----- Regarding: DM update Please call for BG update - she should have started Ozempic 2 weeks ago. Any lows?

## 2022-10-30 NOTE — Progress Notes (Cosign Needed)
Called patient for blood glucose readings. No answer; left message.  Charlene Brooke, PharmD notified  Marijean Niemann, [place] Clinical Pharmacy Assistant [phone]

## 2022-11-04 ENCOUNTER — Ambulatory Visit: Payer: HMO

## 2022-11-04 ENCOUNTER — Other Ambulatory Visit: Payer: Self-pay | Admitting: Family Medicine

## 2022-11-04 DIAGNOSIS — I5022 Chronic systolic (congestive) heart failure: Secondary | ICD-10-CM | POA: Diagnosis not present

## 2022-11-04 DIAGNOSIS — I428 Other cardiomyopathies: Secondary | ICD-10-CM

## 2022-11-04 NOTE — Telephone Encounter (Signed)
Refill request for TRAMADOL HCL 50 MG TAB   LOV - 04/23/22 Next OV - not scheduled Last refill - 09/04/22 #60/1

## 2022-11-05 LAB — CUP PACEART REMOTE DEVICE CHECK
Battery Remaining Longevity: 44 mo
Battery Voltage: 2.96 V
Brady Statistic AP VP Percent: 5.66 %
Brady Statistic AP VS Percent: 0.01 %
Brady Statistic AS VP Percent: 94.15 %
Brady Statistic AS VS Percent: 0.18 %
Brady Statistic RA Percent Paced: 5.53 %
Brady Statistic RV Percent Paced: 97.32 %
Date Time Interrogation Session: 20240129012403
HighPow Impedance: 58 Ohm
Implantable Lead Connection Status: 753985
Implantable Lead Connection Status: 753985
Implantable Lead Connection Status: 753985
Implantable Lead Implant Date: 20200625
Implantable Lead Implant Date: 20200625
Implantable Lead Implant Date: 20200625
Implantable Lead Location: 753858
Implantable Lead Location: 753859
Implantable Lead Location: 753860
Implantable Lead Model: 4398
Implantable Lead Model: 5076
Implantable Lead Model: 6935
Implantable Pulse Generator Implant Date: 20200625
Lead Channel Impedance Value: 1007 Ohm
Lead Channel Impedance Value: 1026 Ohm
Lead Channel Impedance Value: 185.366
Lead Channel Impedance Value: 190.884
Lead Channel Impedance Value: 202.667
Lead Channel Impedance Value: 266.667
Lead Channel Impedance Value: 278.237
Lead Channel Impedance Value: 304 Ohm
Lead Channel Impedance Value: 361 Ohm
Lead Channel Impedance Value: 399 Ohm
Lead Channel Impedance Value: 456 Ohm
Lead Channel Impedance Value: 475 Ohm
Lead Channel Impedance Value: 513 Ohm
Lead Channel Impedance Value: 608 Ohm
Lead Channel Impedance Value: 703 Ohm
Lead Channel Impedance Value: 703 Ohm
Lead Channel Impedance Value: 722 Ohm
Lead Channel Impedance Value: 836 Ohm
Lead Channel Pacing Threshold Amplitude: 0.625 V
Lead Channel Pacing Threshold Amplitude: 0.75 V
Lead Channel Pacing Threshold Amplitude: 2.5 V
Lead Channel Pacing Threshold Pulse Width: 0.4 ms
Lead Channel Pacing Threshold Pulse Width: 0.4 ms
Lead Channel Pacing Threshold Pulse Width: 0.8 ms
Lead Channel Sensing Intrinsic Amplitude: 0.75 mV
Lead Channel Sensing Intrinsic Amplitude: 0.75 mV
Lead Channel Sensing Intrinsic Amplitude: 17.375 mV
Lead Channel Sensing Intrinsic Amplitude: 17.375 mV
Lead Channel Setting Pacing Amplitude: 1.75 V
Lead Channel Setting Pacing Amplitude: 2 V
Lead Channel Setting Pacing Amplitude: 2.5 V
Lead Channel Setting Pacing Pulse Width: 0.4 ms
Lead Channel Setting Pacing Pulse Width: 0.8 ms
Lead Channel Setting Sensing Sensitivity: 0.3 mV
Zone Setting Status: 755011
Zone Setting Status: 755011

## 2022-11-06 ENCOUNTER — Telehealth: Payer: Self-pay

## 2022-11-06 NOTE — Telephone Encounter (Signed)
Sent. Thanks.   

## 2022-11-06 NOTE — Progress Notes (Signed)
Care Management & Coordination Services Pharmacy Team  Reason for Encounter: Appointment Reminder  Contacted patient to confirm telephone appointment with Charlene Brooke, PharmD on 11/11/2022 at 3:00.  Unsuccessful outreach. Left voicemail for patient to return call.  Star Rating Drugs:  Medication:  Last Fill: Day Supply Atorvastatin 10 mg 11/04/22 30 Losartan 25 mg 09/25/23 90 Ozempic  PAP Verified with [place] Gaps: Annual wellness visit in last year? Yes 09/02/2022  If Diabetic: Last eye exam / retinopathy screening: Up to date Last diabetic foot exam: Up to date  Charlene Brooke, PharmD notified  Marijean Niemann, [place] Assistant [phone]

## 2022-11-07 ENCOUNTER — Other Ambulatory Visit: Payer: Self-pay | Admitting: Family Medicine

## 2022-11-11 ENCOUNTER — Ambulatory Visit: Payer: HMO | Admitting: Pharmacist

## 2022-11-11 ENCOUNTER — Telehealth: Payer: Self-pay | Admitting: Family Medicine

## 2022-11-11 NOTE — Progress Notes (Signed)
Care Management & Coordination Services Pharmacy Note  11/11/2022 Name:  [name] MRN:  [id number] DOB:  Jan 31, 1956  Summary: F/U visit -DM: A1c 6.4% (04/2022); pt has restarted Ozempic and completed 4 weeks of 0.25 mg dose; she continues Lantus 8-10 units daily and Novolog 8-15 units BID; she has been adjusting Lantus dose based on bedtime BG, discussed Lantus dosing is based on fasting BG -Home BG (via [employer] - uses reader): fasting 85-130, bedtime ~200 -HTN: home BP has been elevated since holding losartan (due to AKI) - 166/83, 172/86, 166/83, 151/87, 160/74; she also has been cutting isosorbide in 1/2 due to previous issues with hypotension/dizziness   Recommendations: -Advised to increase Ozempic to 0.5 mg weekly as prescribed; Reduce Novolog to 5-8 units with meals -Advised to resume full dose of isosorbide 30 mg daily; contact provide if BP remains > 150/90 over next 1-2 weeks  Follow up plan: -Health Concierge will call patient 2 weeks for DM update -Pharmacist follow up televisit scheduled for 1 month -Neurology appt 11/26/22; PCP appt due    SUBJECTIVE: [name] is an 67 y.o. year old female who is a primary patient of Damita Dunnings, Elveria Rising, MD.  The care coordination team was consulted for assistance with disease management and care coordination needs.    Engaged with patient by telephone for follow up visit.   Recent office visits: 10/28/22 NP Romilda Garret OV: dog scratch. Update TD booster at pharmacy. Use vaseline on dry skin.  04/23/22 Dr Damita Dunnings OV: A1c 6.4. Stay off [place] (ran out/high cost).    01/30/22 repeat BMP - Cr improved. Repeat BMP 1 month. 01/15/22 Dr Damita Dunnings OV: f/u CKD. Updated amiodarone 200 mg to 1/2 tab QOD, ferrous sulfate to QOD, Gabapentin to 300 mg daily, Novolog to SSI. Cr significnatly worse - advised to hold losartan and torsemide x 2 days, then resume. Repeat BMP 1 week.  Recent consult visits: 10/21/22 NP Hyler (Nephology): AKI -  Cr 2.77 up from 1.9, advised compression stockings. Stop losartan for now. Repeat BMP 1 week. Decrease calcitriol to 0.25 mcg. Repeat PTH 6 weeks.  07/09/22 Dr Caryl Comes (Cardiology): ICD, HF. Renewed NTG. Stop aspirin.   06/03/22 Dr Holley Raring (Nephrology): f/u CKD  04/05/22 Dr Haroldine Laws (HF clinic): no changes. 03/06/22 Dr Holley Raring (Nephrology): f/u CKD. GFR 23. A1c 7.7%. Hold Wilder Glade due to low GFR. 01/03/22 Dr Scot Dock (Vascular): new pt - PAD. S/p L leg balloon angioplasty. Preop eval for TKA today. Will require aggressive DVT ppx post-op. 11/20/21 Dr Caryl Comes (Cardiology): f/u NICM, CHF. Decrease amiodarone to 200 mg 5x per week. Cr 2.18, GFR 24 - requires eval per PCP.    Hospital visits: 05/07/22 admission for TKA.   10/12/21 ED visit [facility]): CHF exacerbation. IV lasix in ED, temporarily increased torsemide for 5 days.   Admitted 02/2021 for cellulitis. She underwent amputation of left great toe 5/27.    Admitted 01/2021 for osteomyelitis of left foot after failing outpatient management. Had initial surgery (I&D, bone biopsy, and sesamoidectomy)   OBJECTIVE:  Lab Results  Component Value Date   CREATININE 2.08 (H) 05/08/2022   BUN 41 (H) 05/08/2022   GFR 23.72 (L) 01/30/2022   EGFR 24 (L) 11/20/2021   GFRNONAA 25 (L) 05/08/2022   GFRAA 23 (L) 11/05/2019   NA 137 05/08/2022   K 5.1 05/08/2022   CALCIUM 8.8 (L) 05/08/2022   CO2 27 05/08/2022   GLUCOSE 389 (H) 05/08/2022    Lab Results  Component Value Date/Time  HGBA1C 6.4 (A) 04/23/2022 04:10 PM   HGBA1C 7.7 (H) 01/15/2022 03:49 PM   HGBA1C 8.1 (H) 10/03/2021 11:37 AM   FRUCTOSAMINE 305 (H) 12/24/2018 05:53 PM   GFR 23.72 (L) 01/30/2022 12:43 PM   GFR 15.31 (L) 01/15/2022 03:49 PM   MICROALBUR 6.9 08/06/2016 02:32 PM   MICROALBUR 10.0 12/12/2015 03:48 PM    Last diabetic Eye exam:  Lab Results  Component Value Date/Time   HMDIABEYEEXA No Retinopathy 08/15/2022 12:00 AM    Last diabetic Foot exam: No results found for:  "HMDIABFOOTEX"   Lab Results  Component Value Date   CHOL 96 10/03/2021   HDL 38.90 (L) 10/03/2021   LDLCALC 43 10/03/2021   TRIG 70.0 10/03/2021   CHOLHDL 2 10/03/2021       Latest Ref Rng & Units 04/10/2022   11:55 AM 10/03/2021   11:37 AM 03/03/2021    1:31 AM  Hepatic Function  Total Protein 6.5 - 8.1 g/dL 7.3  6.4  6.1   Albumin 3.5 - 5.0 g/dL 3.7  3.2  3.0   AST 15 - 41 U/L '19  10  16   '$ ALT 0 - 44 U/L '15  7  14   '$ Alk Phosphatase 38 - 126 U/L 76  82  55   Total Bilirubin 0.3 - 1.2 mg/dL 0.4  0.3  0.4     Lab Results  Component Value Date/Time   TSH 1.120 11/20/2021 12:32 PM   TSH 1.160 03/27/2021 12:47 PM   FREET4 1.19 03/22/2019 11:59 AM   FREET4 1.1 04/16/2016 04:50 PM       Latest Ref Rng & Units 05/08/2022    2:40 AM 04/10/2022   11:55 AM 01/15/2022    3:49 PM  CBC  WBC 4.0 - 10.5 K/uL 7.6  7.5  7.3   Hemoglobin 12.0 - 15.0 g/dL 9.4  11.1  9.6   Hematocrit 36.0 - 46.0 % 29.5  35.0  29.3   Platelets 150 - 400 K/uL 174  210  220.0    Iron/TIBC/Ferritin/ %Sat    Component Value Date/Time   IRON 55 04/10/2022 1155   IRON 39 02/03/2019 1611   TIBC 259 04/10/2022 1155   TIBC 285 02/03/2019 1611   FERRITIN 305 04/10/2022 1155   FERRITIN 239 (H) 02/03/2019 1611   IRONPCTSAT 21 04/10/2022 1155   IRONPCTSAT 14 (L) 02/03/2019 1611   IRONPCTSAT 6 (L) 12/30/2014 1840    Lab Results  Component Value Date/Time   VD25OH 32 06/01/2012 10:00 AM   VITAMINB12 490 04/10/2022 11:56 AM   VITAMINB12 415 11/22/2018 06:50 PM   Clinical ASCVD: Yes  The ASCVD Risk score (Arnett DK, et al., 2019) failed to calculate for the following reasons:   The patient has a prior MI or stroke diagnosis       09/02/2022   10:56 AM 03/30/2021   10:46 AM 03/21/2021    9:24 AM  Depression screen PHQ 2/9  Decreased Interest 0 0 0  Down, Depressed, Hopeless 0  0  PHQ - 2 Score 0 0 0  Altered sleeping   0  Tired, decreased energy   0  Change in appetite   0  Feeling bad or failure  about yourself    0  Trouble concentrating   0  Moving slowly or fidgety/restless   0  Suicidal thoughts   0  PHQ-9 Score   0  Difficult doing work/chores   Not difficult at all    CHA2DS2/VAS Stroke  Risk Points  Current as of 11 minutes ago     6 >= 2 Points: High Risk  1 - 1.99 Points: Medium Risk  0 Points: Low Risk    Last Change: N/A      Points Metrics  1 Has Congestive Heart Failure:  Yes    Current as of 11 minutes ago  1 Has Vascular Disease:  Yes    Current as of 11 minutes ago  1 Has Hypertension:  Yes    Current as of 11 minutes ago  1 Age:  58    Current as of 11 minutes ago  1 Has Diabetes:  Yes    Current as of 11 minutes ago  0 Had Stroke:  No  Had TIA:  No  Had Thromboembolism:  No    Current as of 11 minutes ago  1 Female:  Yes    Current as of 11 minutes ago     Social History   Tobacco Use  Smoking Status Never  Smokeless Tobacco Never   BP Readings from Last 3 Encounters:  10/28/22 128/68  07/09/22 (!) 100/53  06/11/22 132/61   Pulse Readings from Last 3 Encounters:  10/28/22 75  07/09/22 62  06/11/22 68   Wt Readings from Last 3 Encounters:  10/28/22 267 lb (121.1 kg)  09/02/22 268 lb (121.6 kg)  07/09/22 268 lb (121.6 kg)   BMI Readings from Last 3 Encounters:  10/28/22 40.60 kg/m  09/02/22 40.75 kg/m  07/09/22 40.75 kg/m     Medications Reviewed Today     Reviewed by [name], RPH (Pharmacist) on 11/11/22 at 1542  Med List Status: <None>   Medication Order Taking? Sig Documenting Provider Last Dose Status Informant  acetaminophen (TYLENOL) 500 MG tablet [id number] Yes Take 2 tablets (1,000 mg total) by mouth every 6 (six) hours as needed for moderate pain or headache. Irving Copas, PA-C Taking Active   amiodarone (PACERONE) 200 MG tablet [id number] Yes TAKE 1/2 TABLET BY MOUTH EVERY OTHER DAY [name], MD Taking Active Self  atorvastatin (LIPITOR) 10 MG tablet [id number] Yes TAKE 1 TABLET BY MOUTH ONCE  DAILY [name], MD Taking Active Self  Calcium Carb-Cholecalciferol (CALCIUM CARBONATE-VITAMIN D3) 600-400 MG-UNIT TABS [id number] Yes Take 1 tablet by mouth 2 (two) times a day. [provider] Taking Active Self  carvedilol (COREG) 3.125 MG tablet [id number] Yes TAKE 1 TABLET BY MOUTH TWICE (2) DAILY WITH A MEAL [name], MD Taking Active   clopidogrel (PLAVIX) 75 MG tablet [id number] Yes TAKE 1 TABLET BY MOUTH ONCE A DAY Deboraha Sprang, MD Taking Active   Continuous Blood Gluc Receiver (FREESTYLE LIBRE 2 READER) DEVI [id number] Yes Use to check sugar multiple times per day as directed.  H/o DM2, treated with insulin. [name], MD Taking Active Self  Continuous Blood Gluc Receiver (FREESTYLE LIBRE 2 READER) Kerrin Mo [id number] Yes FreeStyle Libre 2 Reader [provider] Taking Active Self  Continuous Blood Gluc Sensor (FREESTYLE LIBRE 2 SENSOR) [place] [id number] Yes USE TO CHECK SUGAR MULTIPLE TIMES PER DAY AS DIRECTED [name], MD Taking Active   DULoxetine (CYMBALTA) 30 MG capsule [id number] Yes Take 1 capsule (30 mg total) by mouth at bedtime. Narda Amber K, DO Taking Active   gabapentin (NEURONTIN) 600 MG tablet [id number] Yes TAKE 1 TABLET BY IN THE MORNING AND 2 ATBEDTIME [name], MD Taking Active   insulin glargine (LANTUS SOLOSTAR) 100 UNIT/ML Solostar Pen [id number]  Yes INJECT 22 UNITS UNDER THE SKIN DAILY.  Patient taking differently: 8-10 units daily in AM   [name], MD Taking Active   Insulin Pen Needle (PEN NEEDLES) 32G X 6 MM MISC [id number] Yes 1 each by Does not apply route 4 (four) times daily. Jacelyn Pi, Lilia Argue, MD Taking Active Self  isosorbide mononitrate (IMDUR) 30 MG 24 hr tablet [id number] Yes TAKE 1 TABLET BY MOUTH ONCE DAILY  Patient taking differently: 15 mg.   Bensimhon, Shaune Pascal, MD Taking Active   levothyroxine (SYNTHROID) 50 MCG tablet [id number] Yes TAKE 1 TABLET BY MOUTH ONCE DAILY BEFOREBREAKFAST [name], MD Taking Active   losartan (COZAAR) 25 MG tablet [id number] No TAKE 1/2 TABLET BY MOUTH DAILY  Patient not taking: Reported on 11/11/2022   BensimhonShaune Pascal, MD Not Taking Active Self           Med Note Luna Glasgow Nov 11, 2022  3:27 PM) On hold per Nephrology (AKI)  methocarbamol (ROBAXIN) 500 MG tablet [id number] Yes Take 1 tablet (500 mg total) by mouth 2 (two) times daily. Sponseller, Gypsy Balsam, PA-C Taking Active   Multiple Vitamin (MULTIVITAMIN WITH MINERALS) TABS tablet [id number] Yes Take 1 tablet by mouth every morning. [provider] Taking Active Self  nitroGLYCERIN (NITROSTAT) 0.4 MG SL tablet [id number] Yes Place 0.4 mg under the tongue every 5 (five) minutes as needed for chest pain. [provider] Taking Active   NOVOLOG FLEXPEN 100 UNIT/ML FlexPen [id number] Yes INJECT 0.05 ML (5 UNITS TOTAL) INTO THE SKIN 3 TIMES DAILY BEFORE MEALS  Patient taking differently: 14 units with breakfast, 5-8 units with dinner   [name], MD Taking Active   Omega-3 Fatty Acids (FISH OIL) 1000 MG CAPS [id number] Yes Take 1,000 mg by mouth 2 (two) times a day. [provider] Taking Active Self  Donald Siva test strip [id number] Yes USE AS INSTRUCTED TO CHECK BLOOD SUGAR 3TIMES DAILY [name], MD Taking Active   polyethylene glycol (MIRALAX / GLYCOLAX) 17 g packet [id number] Yes Take 17 g by mouth daily as needed for mild constipation. Irving Copas, PA-C Taking Active   Semaglutide,0.25 or 0.'5MG'$ /DOS, (OZEMPIC, 0.25 OR 0.5 MG/DOSE,) 2 MG/3ML SOPN [id number] Yes Inject 0.25 mg weekly x 4 weeks, then increase to 0.5 mg weekly [name], MD Taking Active   torsemide (DEMADEX) 20 MG tablet [id number]  Take 1 tablet (20 mg total) by mouth 2 (two) times daily. Bensimhon, Shaune Pascal, MD  Expired 07/28/22 2359   traMADol (ULTRAM) 50 MG tablet [id number] Yes TAKE 1 TO 2 TABLET BY MOUTH AT BEDTIME AS NEEDED [name], MD Taking Active               SDOH:  (Social Determinants of Health) assessments and interventions performed: No SDOH Interventions    Flowsheet Row Clinical Support from 09/02/2022 in [place] at [place] from 03/21/2021 in [place] at [place] Interventions Intervention Not Indicated --  Housing Interventions Intervention Not Indicated --  Transportation Interventions Intervention Not Indicated --  Utilities Interventions Intervention Not Indicated --  Alcohol Usage Interventions Intervention Not Indicated (Score <7) --  Depression Interventions/Treatment  -- MHD6-2 Score <4 Follow-up Not Indicated  Financial Strain Interventions Intervention Not Indicated --  Physical Activity Interventions Intervention Not Indicated --  Stress Interventions Intervention Not Indicated --  Social Connections Interventions Intervention Not Indicated --      SDOH Screenings   Food Insecurity: No Food Insecurity (09/02/2022)  Housing: Low Risk  (09/02/2022)  Transportation Needs: No Transportation Needs (09/02/2022)  Utilities: Not At Risk (09/02/2022)  Alcohol Screen: Low Risk  (09/02/2022)  Depression (PHQ2-9): Low Risk  (09/02/2022)  Financial Resource Strain: Low Risk  (09/02/2022)  Physical Activity: Inactive (09/02/2022)  Social Connections: Socially Isolated (09/02/2022)  Stress: No Stress Concern Present (09/02/2022)  Tobacco Use: Low Risk  (10/28/2022)    Medication Assistance:  [place] PAP approved for 2024  Medication Access: Within the past 30 days, how often has patient missed a dose of medication? 0 Is a pillbox or other method used to improve adherence? Yes  Factors that may affect medication adherence? financial need Are meds synced by current pharmacy? No  Are meds delivered by current pharmacy? No  Does patient experience delays in picking up medications due to  transportation concerns? No   [facility] Services Reviewed: Is patient disadvantaged to use [facility]?: Yes  Current Rx insurance plan: HTA Name and location of Current pharmacy:  [id number] [address] [name] [address] Phone: [phone] Fax: [place], [address] [place] [address] Phone: [phone] Fax: [phone]  [facility] services reviewed with patient today?: No  Patient requests to transfer care to [facility]?: No  Reason patient declined to change pharmacies: Disadvantaged due to insurance/mail order   Compliance/Adherence/Medication fill history: Care Gaps: None  Star-Rating Drugs: Atorvastatin - PDC 36%; [place] inaccurate, LF 11/04/22 x 30 ds Losartan - PDC 74%; [place] inaccurate, LF 09/25/23 x 90 ds Ozempic (PAP)    ASSESSMENT / PLAN    Diabetes (A1c goal <7%) -Query Controlled - A1c 6.4% (04/2022) at goal; pt recently re-started Ozempic, she has completed 4 weeks of 0.25 mg/wk; pt adjusts her Lantus based on bedtime glucose readings, discussed Lantus dose should be based off fasting glucose -Pt eats 2 meals per day -Pt reports rare hypoglycemia; she uses peanut butter for hypoglycemic episodes -Home BG (via [employer] - uses reader):   Fasting BG 107, 119, 110, 100, 88  Bedtime BG: ~200; 288 after muffins -Hx toe amputation, osteomyelitis -Current medications: Lantus 8-10 units daily AM-Appropriate, Effective, Query Safe Novolog 14 units AM, 5-8 units PM Ozempic 0.25 mg weekly  - Appropriate, Effective, Safe, Accessible Freestyle Libre 2 (w/ reader) - Appropriate, Effective, Safe, Accessible -Medications previously tried: Trulicity, glimepiride, Jardiance, metformin, Januvia, Ozempic (cost in donut hole), Farxiga (low GFR) -Reviewed difference between basal and bolus insulin;  advised to avoid adjusting Lantus dose based on daily BG readings unless fasting BG is < 70 or > 130 -Discussed increasing Ozempic will lead to lower insulin requirements; advised to monitor glucose closely and reduce Novolog dose as needed -Advised to increase Ozempic to 0.5 mg weekly as prescribed   Heart Failure (Goal: manage symptoms and prevent exacerbations) -Uncontrolled - per home BP readings, in s/o holding losartan d/t AKI -Current home BP/HR readings: 166/83, 172/86, 166/83, 151/87, 160/74 -Last ejection fraction: 50-55% (Date: 11/26/21); previously 10-15% in 2020 -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%); NYHA Class: I-II -Current treatment: Carvedilol 3.125 mg BID -Appropriate, Query Effective Isosorbide MN 30 mg -1/2 tab daily -Appropriate, Query Effective Losartan 25 mg - 1/2 tab daily -ON HOLD Torsemide 20 mg - 2 tab  daily-Appropriate, Effective, Query Safe -Medications previously tried: Jardiance 10 mg -Educated on Benefits of medications for managing symptoms and prolonging life; Importance of blood pressure control -Advised to resume full dose isosorbide 30 mg daily; advised to contact providers if BP remains > 150/90 after 1-2 wks   CKD stage 4 / HyperPTH (Goal: prevent progression) -Query controlled -  -Hx AKI 01/2022 on torsemide/losartan, improved with holding medication; recently 10/2022 she had AKI again and was instructed to hold losartan again -All medications assessed for renal dosing and appropriateness in chronic kidney disease. -Current treatment  Ferrous sulfate 325 mg BID -Appropriate, Effective, Safe, Accessible Losartan 25 mg - 1/2 tab daily -ON HOLD -Medications previously tried: calcitriol -Counseled on maintaining adequate hydration; importance of BP and DM control to prevent CKD progression; avoidance of NSAIDs and other nephrotoxins -Recommended to continue current medication   Hyperlipidemia: (LDL goal < 70) -Controlled - LDL 43 (09/2021)  at goal; pt reports compliance with medications as prescribed; she endorses some bleeding issues - when she nicks herself it takes longer to stop bleeding, she denies blood in urine/stool -Hx CAD - MI 07/2012 w/ stent; Hx stroke -Current treatment: Atorvastatin 10 mg daily - Appropriate, Effective, Safe, Accessible Isosorbide MN 30 mg daily - Appropriate, Effective, Safe, Accessible Nitroglycerin 0.4 mg SL prn - Appropriate, Effective, Safe, Accessible Clopidogrel 75 mg daily HS - Appropriate, Effective, Safe, Accessible -Educated on Cholesterol goals; Benefits of statin for ASCVD risk reduction; -Counseled on benefits of clopidogrel/aspirin for stent patency; discussed nuisance bleeding vs major bleeding; counseled on signs/symptoms of major bleeding and when to seek emergency care -Recommended to continue current medication    Atrial Tach[place]ia/NSVT/AFIB (Goal: prevent stroke and major bleeding) -Controlled - pt reports palpitations have improved since being on amiodarone -Clarified Afib dx previously - pt had 1 hr of SCAF on device, no repeat events; cardiology has not recommended anticoagulation -CHADSVASC: 8; per chart review patient has never been on anticoagulation for Afib (was briefly on Eliquis for a DVT in 2016); she is currently on DAPT for hx CAD w/ stent -Current treatment: Amiodarone 200 mg - 1/2 tab QOD Appropriate, Effective, Safe, Accessible Carvedilol 3.125 mg AM - Appropriate, Effective, Safe, Accessible -Medications previously tried: Eliquis (for DVT, 2016) -Counseled on benefits of rate/rhythm control drugs to prevent tachycardia -Recommended to continue current medication  Health Maintenance -Vaccine gaps: Shingrix, covid booster, flu    Charlene Brooke, PharmD, BCACP Clinical Pharmacist [facility] at [facility] [phone]

## 2022-11-11 NOTE — Telephone Encounter (Signed)
Please schedule OV when possible.  We can do labs at the visit.  Thanks.

## 2022-11-11 NOTE — Patient Instructions (Signed)
Visit Information  Phone number for Pharmacist: [phone]  Thank you for meeting with me to discuss your medications! Below is a summary of what we talked about during the visit:   Recommendations: -Advised to increase Ozempic to 0.5 mg weekly as prescribed; Reduce Novolog to 5-8 units with meals -Advised to resume full dose of isosorbide 30 mg daily; contact provide if BP remains > 150/90 over next 1-2 weeks  Follow up plan: -Health Concierge will call patient 2 weeks for DM update -Pharmacist follow up televisit scheduled for 1 month -Neurology appt 11/26/22; PCP appt due   Charlene Brooke, PharmD, BCACP Clinical Pharmacist [facility] [phone]

## 2022-11-12 NOTE — Telephone Encounter (Signed)
Patient has been scheduled.

## 2022-11-18 ENCOUNTER — Encounter: Payer: Self-pay | Admitting: Family Medicine

## 2022-11-18 ENCOUNTER — Ambulatory Visit (INDEPENDENT_AMBULATORY_CARE_PROVIDER_SITE_OTHER): Payer: HMO | Admitting: Family Medicine

## 2022-11-18 VITALS — BP 102/52 | HR 72 | Temp 97.1°F | Ht 68.0 in | Wt 267.0 lb

## 2022-11-18 DIAGNOSIS — Z794 Long term (current) use of insulin: Secondary | ICD-10-CM | POA: Diagnosis not present

## 2022-11-18 DIAGNOSIS — E1122 Type 2 diabetes mellitus with diabetic chronic kidney disease: Secondary | ICD-10-CM | POA: Diagnosis not present

## 2022-11-18 DIAGNOSIS — D631 Anemia in chronic kidney disease: Secondary | ICD-10-CM

## 2022-11-18 DIAGNOSIS — E785 Hyperlipidemia, unspecified: Secondary | ICD-10-CM

## 2022-11-18 DIAGNOSIS — E1142 Type 2 diabetes mellitus with diabetic polyneuropathy: Secondary | ICD-10-CM | POA: Diagnosis not present

## 2022-11-18 DIAGNOSIS — N189 Chronic kidney disease, unspecified: Secondary | ICD-10-CM

## 2022-11-18 MED ORDER — LANTUS SOLOSTAR 100 UNIT/ML ~~LOC~~ SOPN: PEN_INJECTOR | SUBCUTANEOUS | 1 refills | Status: DC

## 2022-11-18 MED ORDER — OZEMPIC (0.25 OR 0.5 MG/DOSE) 2 MG/3ML ~~LOC~~ SOPN: PEN_INJECTOR | SUBCUTANEOUS | 3 refills | Status: DC

## 2022-11-18 MED ORDER — GABAPENTIN 600 MG PO TABS: 1200.0000 mg | ORAL_TABLET | Freq: Every day | ORAL | Status: DC

## 2022-11-18 MED ORDER — TORSEMIDE 20 MG PO TABS: 40.0000 mg | ORAL_TABLET | Freq: Every day | ORAL | Status: DC

## 2022-11-18 MED ORDER — NOVOLOG FLEXPEN 100 UNIT/ML ~~LOC~~ SOPN: PEN_INJECTOR | SUBCUTANEOUS | 1 refills | Status: DC

## 2022-11-18 NOTE — Patient Instructions (Signed)
Don't change your meds for now. Go to the lab on the way out.   If you have mychart we'll likely use that to update you.    Take care.  Glad to see you.

## 2022-11-18 NOTE — Progress Notes (Unsigned)
Diabetes:  Using medications without difficulties: yes Hypoglycemic episodes:no Hyperglycemic episodes:no Feet problems: see below.  Blood Sugars averaging: 80-120 in the AM and usually slightly higher later in the PM eye exam within last year: yes Labs pending.   Using L ankle brace at baseline.  Using walker at baseline.  H/o neuropathy noted.    She isn't lightheaded on standing.    Elevated Cholesterol: Using medications without problems:yes Muscle aches: no Diet compliance: yes Labs pending.   H/o anemia noted.  Recheck ferritin pending. See notes on labs.     PMH and SH reviewed  Meds, vitals, and allergies reviewed.   ROS: Per HPI unless specifically indicated in ROS section   GEN: nad, alert and oriented HEENT: ncat NECK: supple w/o LA CV: rrr. PULM: ctab, no inc wob ABD: soft, +bs EXT: no edema SKIN: chronic changes on the BLE Left ankle orthosis issue at baseline.  Diabetic foot exam: Multiple digit amputations noted.  No skin breakdown Callus noted L plantar side.  Trimmed with scalpel at OV w/o complication, pt consented.  No bleeding.  Tolerated well. dec DP pulses (pulse less prominent on L foot compared to R) dec sensation to light touch and absent to monofilament Nails normal  30 minutes were devoted to patient care in this encounter (this includes time spent reviewing the patient's file/history, interviewing and examining the patient, counseling/reviewing plan with patient).

## 2022-11-19 LAB — LIPID PANEL
Cholesterol: 135 mg/dL (ref 0–200)
HDL: 34.5 mg/dL — ABNORMAL LOW (ref >39.00)
LDL Cholesterol: 62 mg/dL (ref 0–99)
NonHDL: 100.89
Total CHOL/HDL Ratio: 4
Triglycerides: 193 mg/dL — ABNORMAL HIGH (ref 0.0–149.0)
VLDL: 38.6 mg/dL (ref 0.0–40.0)

## 2022-11-19 LAB — HEMOGLOBIN A1C: Hgb A1c MFr Bld: 6.5 % (ref 4.6–6.5)

## 2022-11-19 LAB — FERRITIN: Ferritin: 138.2 ng/mL (ref 10.0–291.0)

## 2022-11-19 LAB — TSH: TSH: 0.85 u[IU]/mL (ref 0.35–5.50)

## 2022-11-20 NOTE — Assessment & Plan Note (Signed)
History of.  See notes on labs. 

## 2022-11-20 NOTE — Assessment & Plan Note (Signed)
See notes on labs.  Sugars been controlled on home checks in the meantime.  History of neuropathy, using left ankle orthosis at baseline.  Using walker at baseline.  Footcare discussed with patient.  Callus trimmed.  Foot cautions discussed with patient.

## 2022-11-20 NOTE — Assessment & Plan Note (Signed)
Continue atorvastatin.  See notes on labs.

## 2022-11-25 ENCOUNTER — Telehealth: Payer: Self-pay

## 2022-11-25 ENCOUNTER — Ambulatory Visit: Payer: HMO | Admitting: Neurology

## 2022-11-25 ENCOUNTER — Encounter: Payer: Self-pay | Admitting: Internal Medicine

## 2022-11-25 ENCOUNTER — Ambulatory Visit (INDEPENDENT_AMBULATORY_CARE_PROVIDER_SITE_OTHER): Payer: HMO | Admitting: Internal Medicine

## 2022-11-25 VITALS — BP 120/62 | HR 82 | Temp 97.5°F | Ht 68.0 in | Wt 265.0 lb

## 2022-11-25 DIAGNOSIS — K1121 Acute sialoadenitis: Secondary | ICD-10-CM | POA: Insufficient documentation

## 2022-11-25 MED ORDER — AMOXICILLIN-POT CLAVULANATE 875-125 MG PO TABS: 1.0000 | ORAL_TABLET | Freq: Two times a day (BID) | ORAL | 0 refills | Status: DC

## 2022-11-25 NOTE — Telephone Encounter (Signed)
Per appt notes pt has appt with Dr Silvio Pate 11/25/22 at 2:15. I spoke with pt and she said starting on Sat 11/23/22 pt noticed some swelling under tongue; then on 11/24/22 pt noticed knot on lt side of neck. Today pt said there is more swelling on lt side of neck. No difficulty in breathing but slight problem with swallowing. Pt said no S/T and issue with swallowing is not bad. Pt declines going to UC or ED. Pt advised if any difficulty in breathing or swelling increases prior to appt pt is to go to ED. Pt voiced understanding but plans to keep appt with Dr Silvio Pate today at 2:15;(pt will be at [facility] at 2 PM for registration at front desk). Sending note to Dr Silvio Pate and Silvio Pate pool and will teams Janett Billow CMA who is working with Dr Silvio Pate this afternoon.

## 2022-11-25 NOTE — Assessment & Plan Note (Signed)
Unclear if this is from stone or just infection Very sudden enlargement so doubt tumor Will try empiric augmentin 875 bid x 7 days If doesn't improve, or the parotid enlargement doesn't go down over time--will set up with ENT

## 2022-11-25 NOTE — Telephone Encounter (Signed)
[facility] - Client TELEPHONE ADVICE RECORD [facility] Patient Name: [name] Gender: Female DOB: [birth year] Age: 67 Y 9 M 11 D Return Phone Number: [phone] (Primary) Address: City/ State/ Zip: [place] [address] Client [facility] - Client Client Site [place] - Day Provider Renford Dills - MD Contact Type Call Who Is Calling Patient / Member / Family / Caregiver Call Type Triage / Clinical Relationship To Patient Self Return Phone Number [phone] (Primary) Chief Complaint Facial Swelling Reason for Call Symptomatic / Request for [place] states she has swelling on her left side of her face and has a knot. Translation No Nurse Assessment Nurse: Thad Ranger, RN, Denise Date/Time (Eastern Time): 11/25/2022 11:07:27 AM Confirm and document reason for call. If symptomatic, describe symptoms. ---Caller states she has swelling on her left side of her neck and has a knot. Denies tooth pain and sore throat. Does the patient have any new or worsening symptoms? ---Yes Will a triage be completed? ---Yes Related visit to physician within the last 2 weeks? ---No Does the PT have any chronic conditions? (i.e. diabetes, asthma, this includes High risk factors for pregnancy, etc.) ---No Is this a behavioral health or substance abuse call? ---No Guidelines Guideline Title Affirmed Question Affirmed Notes Nurse Date/Time Eilene Ghazi Time) Lymph Nodes - Swollen [1] Single large node AND [2] size > 1 inch (2.5 cm) AND [3] no fever Carmon, RN, Denise 11/25/2022 11:09:39 AM Disp. Time Eilene Ghazi Time) Disposition Final User 11/25/2022 11:11:13 AM See PCP within 24 Hours Yes Carmon, RN, Langley Gauss Final Disposition 11/25/2022 11:11:13 AM See PCP within 24 Hours Yes Carmon, RN, Langley Gauss PLEASE NOTE: All timestamps contained within this report are represented as [place]  Standard Time. CONFIDENTIALTY NOTICE: This fax transmission is intended only for the addressee. It contains information that is legally privileged, confidential or otherwise protected from use or disclosure. If you are not the intended recipient, you are strictly prohibited from reviewing, disclosing, copying using or disseminating any of this information or taking any action in reliance on or regarding this information. If you have received this fax in error, please notify us immediately by telephone so that we can arrange for its return to [place]. Phone: [phone], Toll-Free: [phone], Fax: [phone] Page: 2 of 2 Call Id: [id number] [place] Disagree/Comply Comply Caller Understands Yes PreDisposition Call Doctor Care Advice Given Per Guideline SEE PCP WITHIN 24 HOURS: PAIN MEDICINES: * ACETAMINOPHEN - EXTRA STRENGTH TYLENOL: Take 1,000 mg (two 500 mg pills) every 6 to 8 hours as needed. Each Extra Strength Tylenol pill has 500 mg of acetaminophen. The most you should take is 6 pills a day (3,000 mg total). Note: In [place], the maximum is 8 pills a day (4,000 mg total). CALL BACK IF: * You become worse CARE ADVICE given per Lymph Nodes Swollen (Adult) guideline. Comments User: Romeo Apple, RN Date/Time Eilene Ghazi Time): 11/25/2022 11:09:19 AM Hx of Diabetes, CAD w/dual pacer/AICD, Kidney failure w/o dialysis User: Romeo Apple, RN Date/Time Eilene Ghazi Time): 11/25/2022 11:11:00 AM Appt today at 46 Referrals REFERRED TO PCP OFFIC

## 2022-11-25 NOTE — Telephone Encounter (Signed)
Okay---I will evaluate her at the [facility]

## 2022-11-25 NOTE — Progress Notes (Signed)
Subjective:    Patient ID: [name], female    DOB: [birth year], 67 y.o.   MRN: [id number]  HPI Here due to facial swelling  Let mandibular swelling Has a knot there Feels something under her tongue---like swelling there (2 days ago) Next day ---felt knot along the mandible Has bad left upper molar--but no pain there No fever No sweats or chills  Did have slight trouble swallowing apple cobbler--today No problems with this yesterday No problems with pop tart this morning  Current Outpatient Medications on File Prior to Visit  Medication Sig Dispense Refill   acetaminophen (TYLENOL) 500 MG tablet Take 2 tablets (1,000 mg total) by mouth every 6 (six) hours as needed for moderate pain or headache. 30 tablet 0   amiodarone (PACERONE) 200 MG tablet TAKE 1/2 TABLET BY MOUTH EVERY OTHER DAY     atorvastatin (LIPITOR) 10 MG tablet TAKE 1 TABLET BY MOUTH ONCE DAILY 30 tablet 11   Calcium Carb-Cholecalciferol (CALCIUM CARBONATE-VITAMIN D3) 600-400 MG-UNIT TABS Take 1 tablet by mouth 2 (two) times a day.     carvedilol (COREG) 3.125 MG tablet TAKE 1 TABLET BY MOUTH TWICE (2) DAILY WITH A MEAL 180 tablet 2   clopidogrel (PLAVIX) 75 MG tablet TAKE 1 TABLET BY MOUTH ONCE A DAY 30 tablet 11   Continuous Blood Gluc Receiver (FREESTYLE LIBRE 2 READER) DEVI Use to check sugar multiple times per day as directed.  H/o DM2, treated with insulin. 1 each 0   Continuous Blood Gluc Receiver (FREESTYLE LIBRE 2 READER) DEVI FreeStyle Libre 2 Reader     Continuous Blood Gluc Sensor (FREESTYLE LIBRE 2 SENSOR) MISC USE TO CHECK SUGAR MULTIPLE TIMES PER DAY AS DIRECTED 2 each 0   DULoxetine (CYMBALTA) 30 MG capsule Take 1 capsule (30 mg total) by mouth at bedtime. 90 capsule 3   gabapentin (NEURONTIN) 600 MG tablet Take 2 tablets (1,200 mg total) by mouth at bedtime.     insulin aspart (NOVOLOG FLEXPEN) 100 UNIT/ML FlexPen 14 units with breakfast, 5-8 units with dinner 15 mL 1   insulin glargine (LANTUS  SOLOSTAR) 100 UNIT/ML Solostar Pen 8 units daily in PM 15 mL 1   Insulin Pen Needle (PEN NEEDLES) 32G X 6 MM MISC 1 each by Does not apply route 4 (four) times daily. 150 each 11   isosorbide mononitrate (IMDUR) 30 MG 24 hr tablet TAKE 1 TABLET BY MOUTH ONCE DAILY 90 tablet 3   levothyroxine (SYNTHROID) 50 MCG tablet TAKE 1 TABLET BY MOUTH ONCE DAILY BEFOREBREAKFAST 90 tablet 1   losartan (COZAAR) 25 MG tablet TAKE 1/2 TABLET BY MOUTH DAILY 45 tablet 4   Multiple Vitamin (MULTIVITAMIN WITH MINERALS) TABS tablet Take 1 tablet by mouth every morning.     nitroGLYCERIN (NITROSTAT) 0.4 MG SL tablet Place 0.4 mg under the tongue every 5 (five) minutes as needed for chest pain.     Omega-3 Fatty Acids (FISH OIL) 1000 MG CAPS Take 1,000 mg by mouth 2 (two) times a day.     ONETOUCH ULTRA test strip USE AS INSTRUCTED TO CHECK BLOOD SUGAR 3TIMES DAILY 300 each 4   polyethylene glycol (MIRALAX / GLYCOLAX) 17 g packet Take 17 g by mouth daily as needed for mild constipation. 14 each 0   Semaglutide,0.25 or 0.5MG/DOS, (OZEMPIC, 0.25 OR 0.5 MG/DOSE,) 2 MG/3ML SOPN Inject 0.5 mg weekly 3 mL 3   torsemide (DEMADEX) 20 MG tablet Take 2 tablets (40 mg total) by mouth daily.  traMADol (ULTRAM) 50 MG tablet TAKE 1 TO 2 TABLET BY MOUTH AT BEDTIME AS NEEDED 60 tablet 1   No current facility-administered medications on file prior to visit.    Allergies  Allergen Reactions   Heparin Other (See Comments)    Can not remember reaction   Nsaids Other (See Comments)    Kidney disease    Past Medical History:  Diagnosis Date   Acute myocardial infarction, subendocardial infarction, initial episode of care ([place]) 07/21/2012   Acute osteomyelitis involving ankle and foot ([place]) [id number]   Acute systolic heart failure ([place]) 07/21/2012   New onset 07/19/12; admission to [facility] ED. Elevated Troponins.  S/p 2D-echo with EF 20-25%.  S/p cardiac catheterization with stenting LAD.  Repeat 2D-echo 10/2011 with improved  EF of 35%.    Anemia    Arthritis    Automatic implantable cardioverter-defibrillator in situ    a. MDT CRT-D 06/2014, [id number] H  .removed in feb 2020   CAD (coronary artery disease)    a. cardiac cath 101/04/2012: PCI/DES to chronically occluded mLAD, consideration PCI to diag branch in 4 weeks.    Cataract    Chicken pox    Chronic kidney disease    Chronic systolic CHF (congestive heart failure) (HCC)    a. mixed ICM & NICM; b. EF 20-25% by echo 07/2012, mid-dist 2/3 of LV sev HK/AK, mild MR. echo 10/2012: EF 30-35%, sev HK ant-septal & inf walls, GR1DD, mild MR, PASP 33. c. echo 02/2013: EF 30%, GR1DD, mild MR. echo 04/2014: EF 30%, Septal-lat dyssynchrony, global HK, inf AK, GR1DD, mild MR. d. echo 10/2014: EF50-55%, WM nl, GR1DD, septal mild paradox. e. echo 02/2015: EF 50-55%, wm    Depression    Heart attack ([place])    Heel ulcer ([place]) 04/27/2015   History of blood transfusion ~ 2011   "plasma; had neuropathy; couldn't walk"   Hypertension    Hypothyroidism    LBBB (left bundle branch block)    Neuromuscular disorder ([place])    Neuropathy 2011   Obesity, unspecified    OSA on CPAP    Moderate with AHI 23/hr and now on CPAP at 16cm H2O.  Her DME is [facility]   Pure hypercholesterolemia    Sepsis ([place]) 09/2018   Type II diabetes mellitus ([place])    Unspecified vitamin D deficiency     Past Surgical History:  Procedure Laterality Date   ABDOMINAL AORTOGRAM W/LOWER EXTREMITY N/A 02/02/2021   Procedure: ABDOMINAL AORTOGRAM W/LOWER EXTREMITY;  Surgeon: Angelia Mould, MD;  Location: [facility];  Service: Cardiovascular;  Laterality: N/A;   AMPUTATION Left 03/02/2021   Procedure: AMPUTATION RAY, left great toe;  Surgeon: Trula Slade, DPM;  Location: [facility];  Service: Podiatry;  Laterality: Left;   AMPUTATION TOE Right 06/18/2015   Procedure: AMPUTATION TOE;  Surgeon: Samara Deist, DPM;  Location: [facility];  Service: Podiatry;  Laterality: Right;   AMPUTATION TOE Left  10/08/2018   Procedure: AMPUTATION TOE LEFT 2ND;  Surgeon: Albertine Patricia, DPM;  Location: [facility];  Service: Podiatry;  Laterality: Left;   BACK SURGERY     BI-VENTRICULAR IMPLANTABLE CARDIOVERTER DEFIBRILLATOR N/A 07/06/2014   Procedure: BI-VENTRICULAR IMPLANTABLE CARDIOVERTER DEFIBRILLATOR  (CRT-D);  Surgeon: Deboraha Sprang, MD;  Location: [facility] CATH LAB;  Service: Cardiovascular;  Laterality: N/A;   BI-VENTRICULAR IMPLANTABLE CARDIOVERTER DEFIBRILLATOR  (CRT-D)  07/06/2014   BILATERAL OOPHORECTOMY  01/2011   ovarian cyst benign   BIV ICD INSERTION CRT-D N/A 03/31/2019  Procedure: BIV ICD INSERTION CRT-D;  Surgeon: Constance Haw, MD;  Location: [place] [facility];  Service: Cardiovascular;  Laterality: N/A;   COLONOSCOPY WITH PROPOFOL Left 02/22/2015   Procedure: COLONOSCOPY WITH PROPOFOL;  Surgeon: Hulen Luster, MD;  Location: [facility] ENDOSCOPY;  Service: Endoscopy;  Laterality: Left;   CORONARY ANGIOPLASTY WITH STENT PLACEMENT Left 07/2012   new onset systolic CHF; elevated troponins.  Cardiac catheterization with stenting to LAD; EF 15%.  2D-echo: EF 20-25%.   ESOPHAGOGASTRODUODENOSCOPY N/A 02/22/2015   Procedure: ESOPHAGOGASTRODUODENOSCOPY (EGD);  Surgeon: Hulen Luster, MD;  Location: [facility] ENDOSCOPY;  Service: Endoscopy;  Laterality: N/A;   I & D EXTREMITY Left 11/28/2020   Procedure: IRRIGATION AND DEBRIDEMENT OF FOOT;  Surgeon: Trula Slade, DPM;  Location: [facility];  Service: Podiatry;  Laterality: Left;   I & D EXTREMITY Left 01/31/2021   Procedure: IRRIGATION AND DEBRIDEMENT EXTREMITY OF LEFT FOOT WITH BONE BIOPSY OF  1ST METATARSAL AND FIFTH METATARSAL;  Surgeon: Felipa Furnace, DPM;  Location: [facility];  Service: Podiatry;  Laterality: Left;   I & D EXTREMITY Left 02/03/2021   Procedure: IRRIGATION AND DEBRIDEMENT EXTREMITY WITH INTERNAL AMPUTATION FIRST AND FIFTH RAY;  Surgeon: Felipa Furnace, DPM;  Location: [place];  Service: Podiatry;  Laterality: Left;   INCISION AND DRAINAGE ABSCESS  Right 2007   groin; with ICU stay due to sepsis.   IR FLUORO GUIDE CV LINE RIGHT  03/06/2021   IR REMOVAL TUN CV CATH W/O FL  04/27/2021   IR US GUIDE VASC ACCESS RIGHT  03/06/2021   IRRIGATION AND DEBRIDEMENT FOOT Left 04/25/2021   Procedure: EXCISION AND DEBRIDEMENT WOUND ULCER LEFT FOOT; METATARSECTOMY;  Surgeon: Trula Slade, DPM;  Location: [facility];  Service: Podiatry;  Laterality: Left;   LAPAROSCOPIC CHOLECYSTECTOMY  2011   LEFT HEART CATH AND CORONARY ANGIOGRAPHY N/A 10/05/2018   Procedure: LEFT HEART CATH AND CORONARY ANGIOGRAPHY;  Surgeon: Minna Merritts, MD;  Location: [place] [facility];  Service: Cardiovascular;  Laterality: N/A;   LEFT HEART CATHETERIZATION WITH CORONARY ANGIOGRAM N/A 07/21/2012   Procedure: LEFT HEART CATHETERIZATION WITH CORONARY ANGIOGRAM;  Surgeon: Jolaine Artist, MD;  Location: [facility] CATH LAB;  Service: Cardiovascular;  Laterality: N/A;   LUMBAR [place]   L4-5   PACEMAKER REMOVAL  11/2018   due to infection around pacemaker   [place] (PCI-S) N/A 07/23/2012   Procedure: PERCUTANEOUS CORONARY STENT INTERVENTION (PCI-S);  Surgeon: Sherren Mocha, MD;  Location: [facility] CATH LAB;  Service: Cardiovascular;  Laterality: N/A;   PERIPHERAL VASCULAR BALLOON ANGIOPLASTY Left 10/06/2018   Procedure: PERIPHERAL VASCULAR BALLOON ANGIOPLASTY;  Surgeon: Katha Cabal, MD;  Location: [place] [facility];  Service: Cardiovascular;  Laterality: Left;   PERIPHERAL VASCULAR CATHETERIZATION N/A 02/10/2015   Procedure: Picc Line Insertion;  Surgeon: Katha Cabal, MD;  Location: [place] [facility];  Service: Cardiovascular;  Laterality: N/A;   RIGHT HEART CATH N/A 03/29/2019   Procedure: RIGHT HEART CATH;  Surgeon: Jolaine Artist, MD;  Location: [place] [facility];  Service: Cardiovascular;  Laterality: N/A;   TEE WITHOUT CARDIOVERSION N/A 10/02/2018   Procedure: TRANSESOPHAGEAL ECHOCARDIOGRAM (TEE);  Surgeon: Wellington Hampshire, MD;  Location: [facility];  Service: Cardiovascular;  Laterality: N/A;   TOTAL KNEE ARTHROPLASTY Right 05/07/2022   Procedure: TOTAL KNEE ARTHROPLASTY;  Surgeon: Paralee Cancel, MD;  Location: [facility];  Service: Orthopedics;  Laterality: Right;   [place]  VAGINAL HYSTERECTOMY  01/2011   Fibroids/DUB.  Ovaries removed. [place].    Family History  Problem Relation Age of Onset   Diabetes Mother    Hypertension Mother    Arthritis Mother        knees, lumbar DDD, cervical DDD   Cancer Father        prostate,skin,lymphoma.   Cancer Brother 89       bladder cancer; non-smoker   Diabetes Maternal Grandmother    Heart disease Maternal Grandmother    COPD Maternal Grandmother    Diabetes Paternal Grandmother    Obesity Brother    Diabetes Son    Hypertension Son    Cancer Maternal Grandfather    Diabetes Paternal Grandfather     Social History   Socioeconomic History   Marital status: Widowed    Spouse name: [name]   Number of children: 2   Years of education: 12   Highest education level: High school graduate  Occupational History   Occupation: disabled    Employer: UNEMPLOYED    Comment: 03/2010 for peripheral neuropathy   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  Tobacco Use   Smoking status: Never   Smokeless tobacco: Never  Vaping Use   Vaping Use: Never used  Substance and Sexual Activity   Alcohol use: No    Alcohol/week: 0.0 standard drinks of alcohol   Drug use: No   Sexual activity: Not Currently    Birth control/protection: Post-menopausal, Surgical  Other Topics Concern   Not on file  Social History Narrative   Widowed, husband died when she was in surgery.        Children:  2 children (daughter, son). Two grandsons and 2 step grandchildren.      Lives: with husband, daughter, son-in-law, 2 grandsons, and 1 on the way.      Employment: disability for peripheral neuropathy 2012.  Previously had home daycare.   Social  Determinants of Health   Financial Resource Strain: Low Risk  (09/02/2022)   Overall Financial Resource Strain (CARDIA)    Difficulty of Paying Living Expenses: Not hard at all  Food Insecurity: No Food Insecurity (09/02/2022)   Hunger Vital Sign    Worried About Running Out of Food in the Last Year: Never true    Ran Out of Food in the Last Year: Never true  Transportation Needs: No Transportation Needs (09/02/2022)   PRAPARE - Hydrologist (Medical): No    Lack of Transportation (Non-Medical): No  Physical Activity: Inactive (09/02/2022)   Exercise Vital Sign    Days of Exercise per Week: 0 days    Minutes of Exercise per Session: 0 min  Stress: No Stress Concern Present (09/02/2022)   [place]    Feeling of Stress : Not at all  Social Connections: Socially Isolated (09/02/2022)   Social Connection and Isolation Panel [NHANES]    Frequency of Communication with Friends and Family: More than three times a week    Frequency of Social Gatherings with Friends and Family: More than three times a week    Attends Religious Services: Never    Marine scientist or Organizations: No    Attends Archivist Meetings: Never    Marital Status: Widowed  Intimate Partner Violence: Not At Risk (09/02/2022)   Humiliation, Afraid, Rape, and Kick questionnaire    Fear of Current or Ex-Partner: No  Emotionally Abused: No    Physically Abused: No    Sexually Abused: No   Review of Systems No SOB No ear pain---but sometimes feels something in her ear (slight pain? Today)    Objective:   Physical Exam Constitutional:      Appearance: Normal appearance.  HENT:     Head:     Comments: Firm tender left parotid near the angle of left mandible    Right Ear: Tympanic membrane and ear canal normal.     Left Ear: Tympanic membrane and ear canal normal.     Mouth/Throat:     Pharynx: No  oropharyngeal exudate or posterior oropharyngeal erythema.  Musculoskeletal:     Cervical back: Neck supple.  Lymphadenopathy:     Cervical: No cervical adenopathy.  Neurological:     Mental Status: She is alert.            Assessment & Plan:

## 2022-11-26 ENCOUNTER — Encounter: Payer: Self-pay | Admitting: Internal Medicine

## 2022-11-26 ENCOUNTER — Encounter: Payer: Self-pay | Admitting: Neurology

## 2022-11-26 ENCOUNTER — Ambulatory Visit (INDEPENDENT_AMBULATORY_CARE_PROVIDER_SITE_OTHER): Payer: PPO | Admitting: Neurology

## 2022-11-26 VITALS — BP 124/62 | HR 85 | Ht 68.0 in | Wt 262.0 lb

## 2022-11-26 DIAGNOSIS — E1142 Type 2 diabetes mellitus with diabetic polyneuropathy: Secondary | ICD-10-CM

## 2022-11-26 DIAGNOSIS — G610 Guillain-Barre syndrome: Secondary | ICD-10-CM

## 2022-11-26 DIAGNOSIS — G510 Bell's palsy: Secondary | ICD-10-CM | POA: Diagnosis not present

## 2022-11-26 NOTE — Patient Instructions (Signed)
Please talk to nephrology about the dose of your gabapentin

## 2022-11-26 NOTE — Progress Notes (Signed)
Follow-up Visit   Date: 11/26/22   [name] MRN: [id number] DOB: 05/30/1956   Interim History: [name] is a 67 y.o. right-handed Caucasian female returning to the clinic for follow-up of diabetic polyradiculoneuropathy.  The patient was accompanied to the clinic by self.   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  HbA1c remains well-controlled - Recommend that she reduce gabapentin to 375m to adjust based on her renal function.  She is currently on gabapentin 12087mat bedtime, and I have asked her to discuss with nephrologist next week - Continue Cymbalta 3019maily  2.  Right Bell's palsy with residual ptosis (08/2022)   --------------------------------------------------------- UPDATE 11/26/2022:   She had Bell's plasy in November which has left her right eyelid weak and tends to droop.  She has noticed increased tearing afterwards.  She has minimal weakness of the lower face.  More recently, she has noticed left jaw fullness and pain.  She saw her PCP who started antibiotics for possible parotid gland infection.  She reports no significant change in her neuropathy in the feet and lower legs.  I had lowered gabapentin to 300m66m bedtime at her last visit, however due to pain, her gabapentin was increased to 1200mg88mbedtime by PCP.  She has not discussed this with her nephrologist.    Medications:  Current Outpatient Medications on File Prior to Visit  Medication Sig Dispense Refill   acetaminophen (TYLENOL) 500 MG tablet Take 2 tablets (1,000 mg total) by mouth every 6 (six) hours as needed for moderate pain or headache. 30 tablet 0   amiodarone (PACERONE) 200 MG tablet TAKE 1/2 TABLET BY MOUTH EVERY OTHER DAY     amoxicillin-clavulanate (AUGMENTIN) 875-125 MG tablet Take 1 tablet by mouth 2 (two) times daily. 14 tablet 0   atorvastatin (LIPITOR) 10 MG tablet TAKE 1 TABLET BY MOUTH ONCE DAILY 30 tablet 11    Calcium Carb-Cholecalciferol (CALCIUM CARBONATE-VITAMIN D3) 600-400 MG-UNIT TABS Take 1 tablet by mouth 2 (two) times a day.     carvedilol (COREG) 3.125 MG tablet TAKE 1 TABLET BY MOUTH TWICE (2) DAILY WITH A MEAL 180 tablet 2   clopidogrel (PLAVIX) 75 MG tablet TAKE 1 TABLET BY MOUTH ONCE A DAY 30 tablet 11   Continuous Blood Gluc Receiver (FREESTYLE LIBRE 2 READER) DEVI Use to check sugar multiple times per day as directed.  H/o DM2, treated with insulin. 1 each 0   Continuous Blood Gluc Receiver (FREESTYLE LIBRE 2 READER) DEVI FreeStyle Libre 2 Reader     Continuous Blood Gluc Sensor (FREESTYLE LIBRE 2 SENSOR) MISC USE TO CHECK SUGAR MULTIPLE TIMES PER DAY AS DIRECTED 2 each 0   DULoxetine (CYMBALTA) 30 MG capsule Take 1 capsule (30 mg total) by mouth at bedtime. 90 capsule 3   gabapentin (NEURONTIN) 600 MG tablet Take 2 tablets (1,200 mg total) by mouth at bedtime.     insulin aspart (NOVOLOG FLEXPEN) 100 UNIT/ML FlexPen 14 units with breakfast, 5-8 units with dinner 15 mL 1   insulin glargine (LANTUS SOLOSTAR) 100 UNIT/ML Solostar Pen 8 units daily in PM 15 mL 1   Insulin Pen Needle (PEN NEEDLES) 32G X 6 MM MISC 1 each by Does not apply route 4 (four) times daily. 150 each 11   isosorbide mononitrate (IMDUR) 30 MG 24 hr tablet TAKE 1 TABLET BY MOUTH ONCE DAILY 90 tablet 3   levothyroxine (SYNTHROID) 50 MCG tablet TAKE 1 TABLET BY MOUTH ONCE  DAILY BEFOREBREAKFAST 90 tablet 1   losartan (COZAAR) 25 MG tablet TAKE 1/2 TABLET BY MOUTH DAILY 45 tablet 4   Multiple Vitamin (MULTIVITAMIN WITH MINERALS) TABS tablet Take 1 tablet by mouth every morning.     nitroGLYCERIN (NITROSTAT) 0.4 MG SL tablet Place 0.4 mg under the tongue every 5 (five) minutes as needed for chest pain.     Omega-3 Fatty Acids (FISH OIL) 1000 MG CAPS Take 1,000 mg by mouth 2 (two) times a day.     ONETOUCH ULTRA test strip USE AS INSTRUCTED TO CHECK BLOOD SUGAR 3TIMES DAILY 300 each 4   polyethylene glycol (MIRALAX / GLYCOLAX)  17 g packet Take 17 g by mouth daily as needed for mild constipation. 14 each 0   Semaglutide,0.25 or 0.5MG/DOS, (OZEMPIC, 0.25 OR 0.5 MG/DOSE,) 2 MG/3ML SOPN Inject 0.5 mg weekly 3 mL 3   torsemide (DEMADEX) 20 MG tablet Take 2 tablets (40 mg total) by mouth daily.     traMADol (ULTRAM) 50 MG tablet TAKE 1 TO 2 TABLET BY MOUTH AT BEDTIME AS NEEDED 60 tablet 1   No current facility-administered medications on file prior to visit.    Allergies:  Allergies  Allergen Reactions   Heparin Other (See Comments)    Can not remember reaction   Nsaids Other (See Comments)    Kidney disease    Vital Signs:  BP 124/62   Pulse 85   Ht 5' 8"$  (1.727 m)   Wt 262 lb (118.8 kg)   SpO2 98%   BMI 39.84 kg/m   Neurological Exam: MENTAL STATUS including orientation to time, place, person, recent and remote memory, attention span and concentration, language, and fund of knowledge is normal.  Speech is not dysarthric.  CRANIAL NERVES:  Pupils round and reactive bilaterally.  Extraocular muscles intact.  Right ptosis with lid lag and mild right facial asymmetric (Bell's palsy).  Smile is symmetric.  There is submandibular and left jaw fullness.  MOTOR:  Motor strength is 5/5 in the RUE and RLE,  LUE with 4/5 finger extensors (old), and 2/5 left dorsoflexion, proximal strength is 5/5.  No pronator drift.  Tone is normal.    SENSORY:  Vibration absent at the ankles, intact at the knees and MCP.  COORDINATION/GAIT:  Gait not tested, she has her walker today  Data: Lab Results  Component Value Date   CREATININE 2.08 (H) 05/08/2022   BUN 41 (H) 05/08/2022   NA 137 05/08/2022   K 5.1 05/08/2022   CL 104 05/08/2022   CO2 27 05/08/2022   Lab Results  Component Value Date   HGBA1C 6.5 11/18/2022      Thank you for allowing me to participate in patient's care.  If I can answer any additional questions, I would be pleased to do so.    Sincerely,    Kishawn Pickar K. Posey Pronto, DO

## 2022-11-27 DIAGNOSIS — Z89421 Acquired absence of other right toe(s): Secondary | ICD-10-CM | POA: Diagnosis not present

## 2022-11-27 DIAGNOSIS — Z89412 Acquired absence of left great toe: Secondary | ICD-10-CM | POA: Diagnosis not present

## 2022-11-27 DIAGNOSIS — E119 Type 2 diabetes mellitus without complications: Secondary | ICD-10-CM | POA: Diagnosis not present

## 2022-11-27 DIAGNOSIS — Z794 Long term (current) use of insulin: Secondary | ICD-10-CM | POA: Diagnosis not present

## 2022-11-27 DIAGNOSIS — Z89422 Acquired absence of other left toe(s): Secondary | ICD-10-CM | POA: Diagnosis not present

## 2022-11-29 ENCOUNTER — Other Ambulatory Visit: Payer: Self-pay

## 2022-11-29 MED ORDER — FREESTYLE LIBRE 2 SENSOR MISC: 3 refills | Status: DC

## 2022-12-02 DIAGNOSIS — I10 Essential (primary) hypertension: Secondary | ICD-10-CM | POA: Diagnosis not present

## 2022-12-02 DIAGNOSIS — E1122 Type 2 diabetes mellitus with diabetic chronic kidney disease: Secondary | ICD-10-CM | POA: Diagnosis not present

## 2022-12-02 DIAGNOSIS — D631 Anemia in chronic kidney disease: Secondary | ICD-10-CM | POA: Diagnosis not present

## 2022-12-02 DIAGNOSIS — N2581 Secondary hyperparathyroidism of renal origin: Secondary | ICD-10-CM | POA: Diagnosis not present

## 2022-12-02 DIAGNOSIS — I509 Heart failure, unspecified: Secondary | ICD-10-CM | POA: Diagnosis not present

## 2022-12-05 ENCOUNTER — Ambulatory Visit
Admission: RE | Admit: 2022-12-05 | Discharge: 2022-12-05 | Disposition: A | Discharge status: Home / Self Care | Payer: PPO | Source: Ambulatory Visit | Attending: Family Medicine | Admitting: Family Medicine

## 2022-12-05 DIAGNOSIS — Z1231 Encounter for screening mammogram for malignant neoplasm of breast: Secondary | ICD-10-CM | POA: Diagnosis not present

## 2022-12-06 ENCOUNTER — Telehealth: Payer: HMO

## 2022-12-10 ENCOUNTER — Ambulatory Visit: Payer: HMO | Admitting: Pharmacist

## 2022-12-10 NOTE — Progress Notes (Signed)
Care Management & Coordination Services Pharmacy Note  12/10/2022 Name:  [name] MRN:  [id number] DOB:  1956-02-15  Summary: F/U visit -DM: A1c 6.5% (11/2022); pt is doing well with Ozempic; she is wearing Freestyle Libre and reports glucose in range most of the time, she is not able to provide readings/averages during call -HTN/HF: BP at goal in recent OV; losartan is on hold per recommendations from nephrology -Neuropathy: pt has stopped gabapentin and started pregabalin per recommendation from nephrology; she reports pain is reasonably controlled with pregabalin; updated med list   Recommendations: -No med changes  Follow up plan: -Health Concierge will call patient 1 month for BP update -Pharmacist follow up televisit scheduled for 3 months -PCP appt due ~05/2023    SUBJECTIVE: [name] is an 67 y.o. year old female who is a primary patient of Damita Dunnings, Elveria Rising, MD.  The care coordination team was consulted for assistance with disease management and care coordination needs.    Engaged with patient by telephone for follow up visit.   Recent office visits: 11/25/22 Dr Silvio Pate OV: acute parotitis - rx Augmentin  11/18/22 Dr Damita Dunnings OV: f/u - A1c 6.5%; update gabapentin to 1200 mg HS, update Novolog; update Lantus to 8 units; update torsemide to 40 mg daily. RTC 6 months  10/28/22 NP Matt Cable OV: dog scratch. Update TD booster at pharmacy. Use vaseline on dry skin 04/23/22 Dr Damita Dunnings OV: A1c 6.4. Stay off [place] (ran out/high cost).  01/30/22 repeat BMP - Cr improved. Repeat BMP 1 month. 01/15/22 Dr Damita Dunnings OV: f/u CKD. Updated amiodarone 200 mg to 1/2 tab QOD, ferrous sulfate to QOD, Gabapentin to 300 mg daily, Novolog to SSI. Cr significnatly worse - advised to hold losartan and torsemide x 2 days, then resume. Repeat BMP 1 week.  Recent consult visits: 12/02/22 NP Hyler (Nephrology): f/u - D/C gabapentin. Start Pregabalin 25 mg BID. Continue to hold losartan.  11/26/22 Dr  Posey Pronto (Neurology): f/u polyradiculoneuropathy; advised to reduce gabapentin to 300 mg based on renal function - discuss with nephrologist.  10/21/22 NP Hyler (Nephology): AKI - Cr 2.77 up from 1.9, advised compression stockings. Stop losartan for now. Repeat BMP 1 week. Decrease calcitriol to 0.25 mcg. Repeat PTH 6 weeks.  07/09/22 Dr Caryl Comes (Cardiology): ICD, HF. Renewed NTG. Stop aspirin.   06/03/22 Dr Holley Raring (Nephrology): f/u CKD 04/05/22 Dr Haroldine Laws (HF clinic): no changes. 03/06/22 Dr Holley Raring (Nephrology): f/u CKD. GFR 23. A1c 7.7%. Hold Wilder Glade due to low GFR. 01/03/22 Dr Scot Dock (Vascular): new pt - PAD. S/p L leg balloon angioplasty. Preop eval for TKA today. Will require aggressive DVT ppx post-op. 11/20/21 Dr Caryl Comes (Cardiology): f/u NICM, CHF. Decrease amiodarone to 200 mg 5x per week. Cr 2.18, GFR 24 - requires eval per PCP.    Hospital visits: 05/07/22 admission for TKA.   10/12/21 ED visit [facility] CHF exacerbation. IV lasix in ED, temporarily increased torsemide for 5 days.   Admitted 02/2021 for cellulitis. She underwent amputation of left great toe 5/27.    Admitted 01/2021 for osteomyelitis of left foot after failing outpatient management. Had initial surgery (I&D, bone biopsy, and sesamoidectomy)   OBJECTIVE:  Lab Results  Component Value Date   CREATININE 2.08 (H) 05/08/2022   BUN 41 (H) 05/08/2022   GFR 23.72 (L) 01/30/2022   EGFR 24 (L) 11/20/2021   GFRNONAA 25 (L) 05/08/2022   GFRAA 23 (L) 11/05/2019   NA 137 05/08/2022   K 5.1 05/08/2022   CALCIUM 8.8 (L)  05/08/2022   CO2 27 05/08/2022   GLUCOSE 389 (H) 05/08/2022    Lab Results  Component Value Date/Time   HGBA1C 6.5 11/18/2022 02:51 PM   HGBA1C 6.4 (A) 04/23/2022 04:10 PM   HGBA1C 7.7 (H) 01/15/2022 03:49 PM   FRUCTOSAMINE 305 (H) 12/24/2018 05:53 PM   GFR 23.72 (L) 01/30/2022 12:43 PM   GFR 15.31 (L) 01/15/2022 03:49 PM   MICROALBUR 6.9 08/06/2016 02:32 PM   MICROALBUR 10.0 12/12/2015 03:48 PM    Last  diabetic Eye exam:  Lab Results  Component Value Date/Time   HMDIABEYEEXA No Retinopathy 08/15/2022 12:00 AM    Last diabetic Foot exam: No results found for: "HMDIABFOOTEX"   Lab Results  Component Value Date   CHOL 135 11/18/2022   HDL 34.50 (L) 11/18/2022   LDLCALC 62 11/18/2022   TRIG 193.0 (H) 11/18/2022   CHOLHDL 4 11/18/2022       Latest Ref Rng & Units 04/10/2022   11:55 AM 10/03/2021   11:37 AM 03/03/2021    1:31 AM  Hepatic Function  Total Protein 6.5 - 8.1 g/dL 7.3  6.4  6.1   Albumin 3.5 - 5.0 g/dL 3.7  3.2  3.0   AST 15 - 41 U/L '19  10  16   '$ ALT 0 - 44 U/L '15  7  14   '$ Alk Phosphatase 38 - 126 U/L 76  82  55   Total Bilirubin 0.3 - 1.2 mg/dL 0.4  0.3  0.4     Lab Results  Component Value Date/Time   TSH 0.85 11/18/2022 02:51 PM   TSH 1.120 11/20/2021 12:32 PM   FREET4 1.19 03/22/2019 11:59 AM   FREET4 1.1 04/16/2016 04:50 PM       Latest Ref Rng & Units 05/08/2022    2:40 AM 04/10/2022   11:55 AM 01/15/2022    3:49 PM  CBC  WBC 4.0 - 10.5 K/uL 7.6  7.5  7.3   Hemoglobin 12.0 - 15.0 g/dL 9.4  11.1  9.6   Hematocrit 36.0 - 46.0 % 29.5  35.0  29.3   Platelets 150 - 400 K/uL 174  210  220.0    Iron/TIBC/Ferritin/ %Sat    Component Value Date/Time   IRON 55 04/10/2022 1155   IRON 39 02/03/2019 1611   TIBC 259 04/10/2022 1155   TIBC 285 02/03/2019 1611   FERRITIN 138.2 11/18/2022 1451   FERRITIN 239 (H) 02/03/2019 1611   IRONPCTSAT 21 04/10/2022 1155   IRONPCTSAT 14 (L) 02/03/2019 1611   IRONPCTSAT 6 (L) 12/30/2014 1840    Lab Results  Component Value Date/Time   VD25OH 32 06/01/2012 10:00 AM   VITAMINB12 490 04/10/2022 11:56 AM   VITAMINB12 415 11/22/2018 06:50 PM   Clinical ASCVD: Yes  The ASCVD Risk score (Arnett DK, et al., 2019) failed to calculate for the following reasons:   The patient has a prior MI or stroke diagnosis       11/25/2022    2:20 PM 09/02/2022   10:56 AM 03/30/2021   10:46 AM  Depression screen PHQ 2/9  Decreased  Interest 0 0 0  Down, Depressed, Hopeless 1 0   PHQ - 2 Score 1 0 0  Altered sleeping 0    Tired, decreased energy 0    Change in appetite 0    Feeling bad or failure about yourself  0    Trouble concentrating 0    Moving slowly or fidgety/restless 0    Suicidal thoughts 0  PHQ-9 Score 1    Difficult doing work/chores Not difficult at all      CHA2DS2/VAS Stroke Risk Points  Current as of 11 minutes ago     6 >= 2 Points: High Risk  1 - 1.99 Points: Medium Risk  0 Points: Low Risk    Last Change: N/A      Points Metrics  1 Has Congestive Heart Failure:  Yes    Current as of 11 minutes ago  1 Has Vascular Disease:  Yes    Current as of 11 minutes ago  1 Has Hypertension:  Yes    Current as of 11 minutes ago  1 Age:  45    Current as of 11 minutes ago  1 Has Diabetes:  Yes    Current as of 11 minutes ago  0 Had Stroke:  No  Had TIA:  No  Had Thromboembolism:  No    Current as of 11 minutes ago  1 Female:  Yes    Current as of 11 minutes ago     Social History   Tobacco Use  Smoking Status Never  Smokeless Tobacco Never   BP Readings from Last 3 Encounters:  11/26/22 124/62  11/25/22 120/62  11/18/22 (!) 102/52   Pulse Readings from Last 3 Encounters:  11/26/22 85  11/25/22 82  11/18/22 72   Wt Readings from Last 3 Encounters:  11/26/22 262 lb (118.8 kg)  11/25/22 265 lb (120.2 kg)  11/18/22 267 lb (121.1 kg)   BMI Readings from Last 3 Encounters:  11/26/22 39.84 kg/m  11/25/22 40.29 kg/m  11/18/22 40.60 kg/m     Medications Reviewed Today     Reviewed by [name], RPH (Pharmacist) on 12/12/22 at 1726  Med List Status: <None>   Medication Order Taking? Sig Documenting Provider Last Dose Status Informant  acetaminophen (TYLENOL) 500 MG tablet [id number] Yes Take 2 tablets (1,000 mg total) by mouth every 6 (six) hours as needed for moderate pain or headache. Irving Copas, PA-C Taking Active   amiodarone (PACERONE) 200 MG tablet  [id number] Yes TAKE 1/2 TABLET BY MOUTH EVERY OTHER DAY [name], MD Taking Active Self  amoxicillin-clavulanate (AUGMENTIN) 875-125 MG tablet [id number] Yes Take 1 tablet by mouth 2 (two) times daily. Venia Carbon, MD Taking Active   atorvastatin (LIPITOR) 10 MG tablet [id number] Yes TAKE 1 TABLET BY MOUTH ONCE DAILY [name], MD Taking Active Self  Calcium Carb-Cholecalciferol (CALCIUM CARBONATE-VITAMIN D3) 600-400 MG-UNIT TABS [id number] Yes Take 1 tablet by mouth 2 (two) times a day. [provider] Taking Active Self  carvedilol (COREG) 3.125 MG tablet [id number] Yes TAKE 1 TABLET BY MOUTH TWICE (2) DAILY WITH A MEAL [name], MD Taking Active   clopidogrel (PLAVIX) 75 MG tablet [id number] Yes TAKE 1 TABLET BY MOUTH ONCE A DAY Deboraha Sprang, MD Taking Active   Continuous Blood Gluc Receiver (FREESTYLE LIBRE 2 READER) DEVI [id number] Yes Use to check sugar multiple times per day as directed.  H/o DM2, treated with insulin. [name], MD Taking Active Self  Continuous Blood Gluc Receiver (FREESTYLE LIBRE 2 READER) Kerrin Mo [id number] Yes FreeStyle Libre 2 Reader [provider] Taking Active Self  Continuous Blood Gluc Sensor (FREESTYLE LIBRE 2 SENSOR) MISC [id number] Yes Use to check sugar multiple times a day as directed. Dx E11.9 [name], MD Taking Active   DULoxetine (CYMBALTA) 30 MG capsule [id number] Yes Take 1 capsule (30 mg total)  by mouth at bedtime. Narda Amber K, DO Taking Active   insulin aspart (NOVOLOG FLEXPEN) 100 UNIT/ML FlexPen [id number] Yes 14 units with breakfast, 5-8 units with dinner [name], MD Taking Active   insulin glargine (LANTUS SOLOSTAR) 100 UNIT/ML Solostar Pen [id number] Yes 8 units daily in PM [name], MD Taking Active   Insulin Pen Needle (PEN NEEDLES) 32G X 6 MM MISC [id number] Yes 1 each by Does not apply route 4 (four) times daily. Jacelyn Pi, Lilia Argue, MD Taking Active Self  isosorbide  mononitrate (IMDUR) 30 MG 24 hr tablet [id number] Yes TAKE 1 TABLET BY MOUTH ONCE DAILY Bensimhon, Shaune Pascal, MD Taking Active   levothyroxine (SYNTHROID) 50 MCG tablet [id number] Yes TAKE 1 TABLET BY MOUTH ONCE DAILY BEFOREBREAKFAST [name], MD Taking Active   losartan (COZAAR) 25 MG tablet [id number] No TAKE 1/2 TABLET BY MOUTH DAILY  Patient not taking: Reported on 12/12/2022   Bensimhon, Shaune Pascal, MD Not Taking Active Self           Med Note Earna Coder Nov 18, 2022  2:19 PM)    Multiple Vitamin (MULTIVITAMIN WITH MINERALS) TABS tablet [id number] Yes Take 1 tablet by mouth every morning. [provider] Taking Active Self  nitroGLYCERIN (NITROSTAT) 0.4 MG SL tablet [id number] Yes Place 0.4 mg under the tongue every 5 (five) minutes as needed for chest pain. [provider] Taking Active   Omega-3 Fatty Acids (FISH OIL) 1000 MG CAPS [id number] Yes Take 1,000 mg by mouth 2 (two) times a day. [provider] Taking Active Self  Donald Siva test strip [id number] Yes USE AS INSTRUCTED TO CHECK BLOOD SUGAR 3TIMES DAILY [name], MD Taking Active   polyethylene glycol (MIRALAX / GLYCOLAX) 17 g packet [id number] Yes Take 17 g by mouth daily as needed for mild constipation. Irving Copas, PA-C Taking Active   pregabalin (LYRICA) 25 MG capsule [id number] Yes Take 25 mg by mouth 2 (two) times daily. Hyler, Gwen Her, NP Taking Active   Semaglutide,0.25 or 0.'5MG'$ /DOS, (OZEMPIC, 0.25 OR 0.5 MG/DOSE,) 2 MG/3ML SOPN [id number] Yes Inject 0.5 mg weekly [name], MD Taking Active   torsemide (DEMADEX) 20 MG tablet [id number] Yes Take 2 tablets (40 mg total) by mouth daily. [name], MD Taking Active   traMADol Veatrice Bourbon) 50 MG tablet [id number] Yes TAKE 1 TO 2 TABLET BY MOUTH AT BEDTIME AS NEEDED [name], MD Taking Active              SDOH:  (Social Determinants of Health) assessments and interventions performed: No SDOH Interventions     Flowsheet Row Office Visit from 11/25/2022 in [place] at [place] from 09/02/2022 in [place] [facility] [place] from 03/21/2021 in [place] at [facility] Interventions -- Intervention Not Indicated --  Housing Interventions -- Intervention Not Indicated --  Transportation Interventions -- Intervention Not Indicated --  Utilities Interventions -- Intervention Not Indicated --  Alcohol Usage Interventions -- Intervention Not Indicated (Score <7) --  Depression Interventions/Treatment  Counseling -- PHQ2-9 Score <4 Follow-up Not Indicated  Financial Strain Interventions -- Intervention Not Indicated --  Physical Activity Interventions -- Intervention Not Indicated --  Stress Interventions -- Intervention Not Indicated --  Social Connections Interventions -- Intervention Not Indicated --      SDOH Screenings  Food Insecurity: No Food Insecurity (09/02/2022)  Housing: Low Risk  (09/02/2022)  Transportation Needs: No Transportation Needs (09/02/2022)  Utilities: Not At Risk (09/02/2022)  Alcohol Screen: Low Risk  (09/02/2022)  Depression (PHQ2-9): Low Risk  (11/25/2022)  Financial Resource Strain: Low Risk  (09/02/2022)  Physical Activity: Inactive (09/02/2022)  Social Connections: Socially Isolated (09/02/2022)  Stress: No Stress Concern Present (09/02/2022)  Tobacco Use: Low Risk  (11/26/2022)    Medication Assistance:  [place] PAP approved for 2024  Medication Access: Within the past 30 days, how often has patient missed a dose of medication? 0 Is a pillbox or other method used to improve adherence? Yes  Factors that may affect medication adherence? financial need Are meds synced by current pharmacy? No  Are meds delivered by current pharmacy? No  Does patient experience delays in picking up medications due to transportation  concerns? No   [facility] Services Reviewed: Is patient disadvantaged to use [facility]?: Yes  Current Rx insurance plan: HTA Name and location of Current pharmacy:  [id number] ([name]) [place], [place] [address] Phone: [phone] Fax: [phone]  [place], [address] [place] [address] Phone: [phone] Fax: [phone]  [facility] services reviewed with patient today?: No  Patient requests to transfer care to [facility]?: No  Reason patient declined to change pharmacies: Disadvantaged due to insurance/mail order   Compliance/Adherence/Medication fill history: Care Gaps: None  Star-Rating Drugs: Atorvastatin - PDC 36%; [place] inaccurate, LF 11/04/22 x 30 ds Losartan - PDC 74%; [place] inaccurate, LF 09/25/23 x 90 ds Ozempic (PAP)    ASSESSMENT / PLAN    Diabetes (A1c goal <7%) -Controlled - A1c 6.5% (11/2022) at goal; pt recently re-started Ozempic and is tolerating well; she reports Freestyle  Elenor Legato is in range most of the time but is not able to provide readings/averages -Pt reports rare hypoglycemia; she uses peanut butter for hypoglycemic episodes -Home BG (via [employer] - uses reader): none reported -Hx toe amputation, osteomyelitis -Current medications: Lantus 8 units daily PM-Appropriate, Effective, Query Safe Novolog 14 units AM, 5-8 units PM - Appropriate, Effective, Safe, Accessible Ozempic 0.5 mg weekly - Appropriate, Effective, Safe, Accessible Freestyle Libre 2 (w/ reader) - Appropriate, Effective, Safe, Accessible -Medications previously tried: Trulicity, glimepiride, Jardiance, metformin, Januvia, Ozempic (cost in donut hole), Farxiga (low GFR) -Reviewed difference between basal and bolus insulin; advised to avoid adjusting Lantus dose based on daily BG readings unless fasting BG is < 70 or >  130 -Discussed increasing Ozempic will lead to lower insulin requirements; advised to monitor glucose closely and reduce Novolog dose as needed -Recommend to continue current medication   Heart Failure / HTN (Goal: BP < 130/80; prevent exacerbations) -Controlled - BP at goal in recent OV; pt is holding losartan per nephrology recommendations -Current home BP/HR readings: none reported -Last ejection fraction: 50-55% (Date: 11/26/21); previously 10-15% in 2020 -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%); NYHA Class: I-II -Current treatment: Carvedilol 3.125 mg BID -Appropriate, Query Effective Isosorbide MN 30 mg -1/2 tab daily -Appropriate, Query Effective Losartan 25 mg - 1/2 tab daily -ON HOLD Torsemide 20 mg - 2 tab daily-Appropriate, Effective, Query Safe -Medications previously tried: Jardiance 10 mg -Educated on Benefits of medications for managing symptoms and prolonging life; Importance of blood pressure control -Recommend to continue current medication   CKD stage 4 / HyperPTH (Goal:  prevent progression) -Query controlled -  -Hx AKI 01/2022 on torsemide/losartan, improved with holding medication; recently 10/2022 she had AKI again and was instructed to hold losartan again -All medications assessed for renal dosing and appropriateness in chronic kidney disease. -Current treatment  Ferrous sulfate 325 mg BID -Appropriate, Effective, Safe, Accessible Losartan 25 mg - 1/2 tab daily -ON HOLD -Medications previously tried: calcitriol -Counseled on maintaining adequate hydration; importance of BP and DM control to prevent CKD progression; avoidance of NSAIDs and other nephrotoxins -Recommended to continue current medication   Hyperlipidemia: (LDL goal < 70) -Controlled - LDL 43 (09/2021) at goal; pt reports compliance with medications as prescribed; she endorses some bleeding issues - when she nicks herself it takes longer to stop bleeding, she denies blood in urine/stool -Hx CAD  - MI 07/2012 w/ stent; Hx stroke -Current treatment: Atorvastatin 10 mg daily - Appropriate, Effective, Safe, Accessible Isosorbide MN 30 mg daily - Appropriate, Effective, Safe, Accessible Nitroglycerin 0.4 mg SL prn - Appropriate, Effective, Safe, Accessible Clopidogrel 75 mg daily HS - Appropriate, Effective, Safe, Accessible -Educated on Cholesterol goals; Benefits of statin for ASCVD risk reduction; -Counseled on benefits of clopidogrel/aspirin for stent patency; discussed nuisance bleeding vs major bleeding; counseled on signs/symptoms of major bleeding and when to seek emergency care -Recommended to continue current medication  Atrial Tach[place]ia/NSVT/AFIB (Goal: prevent stroke and major bleeding) -Controlled - pt reports palpitations have improved since being on amiodarone -Clarified Afib dx previously - pt had 1 hr of SCAF on device, no repeat events; cardiology has not recommended anticoagulation -CHADSVASC: 8; per chart review patient has never been on anticoagulation for Afib (was briefly on Eliquis for a DVT in 2016); she is currently on DAPT for hx CAD w/ stent -Current treatment: Amiodarone 200 mg - 1/2 tab QOD Appropriate, Effective, Safe, Accessible Carvedilol 3.125 mg AM - Appropriate, Effective, Safe, Accessible -Medications previously tried: Eliquis (for DVT, 2016) -Counseled on benefits of rate/rhythm control drugs to prevent tachycardia -Recommended to continue current medication  Neuropathy (Goal: mange pain) -Controlled - pt reports pregabalin is working fine -Current treatment  Pregabalin 25 mg BID - Appropriate, Effective, Safe, Accessible -Medications previously tried: gabapentin (renal fxn)  -Recommended to continue current medication  Health Maintenance -Vaccine gaps: Shingrix, covid booster, flu    Charlene Brooke, PharmD, BCACP Clinical Pharmacist [facility] at [facility] [phone]

## 2022-12-12 NOTE — Patient Instructions (Signed)
Visit Information  Phone number for Pharmacist: [phone]  Thank you for meeting with me to discuss your medications! Below is a summary of what we talked about during the visit:   Summary: F/U visit -DM: A1c 6.5% (11/2022); pt is doing well with Ozempic; she is wearing Freestyle Libre and reports glucose in range most of the time, she is not able to provide readings/averages during call -HTN/HF: BP at goal in recent OV; losartan is on hold per recommendations from nephrology -Neuropathy: pt has stopped gabapentin and started pregabalin per recommendation from nephrology; she reports pain is reasonably controlled with pregabalin; updated med list   Recommendations: -No med changes  Follow up plan: -Health Concierge will call patient 1 month for BP update -Pharmacist follow up televisit scheduled for 3 months -PCP appt due ~05/2023   Charlene Brooke, PharmD, BCACP Clinical Pharmacist [facility] at [facility] [phone]

## 2022-12-18 ENCOUNTER — Other Ambulatory Visit: Payer: Self-pay | Admitting: Family Medicine

## 2022-12-18 NOTE — Progress Notes (Signed)
Remote ICD transmission.   

## 2022-12-27 NOTE — Progress Notes (Signed)
Advanced Heart Failure Clinic Note     Primary Physician [name], MD Primary Cardiologist: Ida Rogue, MD  Electrophysiologist: Virl Axe, MD   Advanced HF: Dr. Haroldine Laws  Sleep Clinic: Dr. Radford Pax   Reason for Visit/CC: Pre-op clearance   HPI:  [name] is a 67 y.o. 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   Admitted on 6/20 with A/C biventricular HF. EF improved previously with CRT but back down again 10-15% (6/20) after CRT removal at [facility] due to sepsis with TV endocarditis. Placed on milrinone which was later weaned off. Unfortunately while hospitalized her husband had CVA and passed away on Apr 12, 2019. On April 12, 2019 she underwent CRT-D reimplantation and was discharged home.   She has made a steady recovery with CRT. Echo 6/21 EF 50-55%   Admitted 4/22 for osteomyelitis of left foot after failing outpatient management. Had initial surgery (I&D, bone biopsy, and sesamoidectomy). Vascular was consulted and she underwent CO2 angiogram with angioplasty of 3 separate in-stent stenoses in the left superficial femoral artery, and then revision of surgery (bone resection of 5th metatarsal head and base of the prox phalanx of the 5th).   Admitted 5/22 for cellulitis. She underwent amputation of left great toe. Repeat arterial studies showed a 50-59% stenosis on the distal left popliteal stent segment, no new interventions per vascular. ID was consulted and recommended 6 weeks of abx. Tunneled central line placed (PICC avoided due to CKD) and she was discharged on Daptomycin.   Echo 2/23 EF 50-55%   Today she returns for pre-op CV clearance. Pending R TKA (under local block)  with Dr. Alvan Dame on 05/07/22. Has been sedentary due to previous L foot osteo and severe R knee pain. Gets around the house with walker  without problem. + L ankle swelling but otherwise ok> No palpitations or syncope.   Cardiac Studies  - Echo (6/20): EF 10-15%, RV severely reduced, moderately enlarged, RVSP 45 mmHG, moderate RAE, moderate MR, mod/severe TR.  - RHC (6/20):    Most Recent Value  Fick Cardiac Output 5.37 L/min  Fick Cardiac Output Index 2.24 (L/min)/BSA  RA A Wave 16 mmHg  RA V Wave 18 mmHg  RA Mean 16 mmHg  RV Systolic Pressure 57 mmHg  RV Diastolic Pressure 8 mmHg  RV EDP 20 mmHg  PA Systolic Pressure 57 mmHg  PA Diastolic Pressure 28 mmHg  PA Mean 40 mmHg  PW A Wave 27 mmHg  PW V Wave 36 mmHg  PW Mean 27 mmHg  QP/QS 1  TPVR Index 17.85 HRUI   Current Meds  Medication Sig   acetaminophen (TYLENOL) 500 MG tablet Take 1,500 mg by mouth every 6 (six) hours as needed for moderate pain or headache.   amiodarone (PACERONE) 200 MG tablet TAKE 1/2 TABLET BY MOUTH EVERY OTHER DAY   aspirin EC 81 MG EC tablet Take 1 tablet (81 mg total) by mouth daily. Swallow whole.   atorvastatin (LIPITOR) 10 MG tablet TAKE 1 TABLET BY MOUTH ONCE DAILY   calcitRIOL (ROCALTROL) 0.5 MCG capsule Take 0.5 mcg by mouth every morning.   Calcium Carb-Cholecalciferol (CALCIUM CARBONATE-VITAMIN D3) 600-400 MG-UNIT TABS Take 1 tablet by mouth 2 (two) times a day.   carvedilol (COREG) 3.125 MG tablet TAKE 1 TABLET BY MOUTH TWICE (2) DAILY WITH A MEAL  clopidogrel (PLAVIX) 75 MG tablet Take 1 tablet (75 mg total) by mouth daily.   Continuous Blood Gluc Receiver (FREESTYLE LIBRE 2 READER) DEVI Use to check sugar multiple times per day as directed.  H/o DM2, treated with insulin.   Continuous Blood Gluc Receiver (FREESTYLE LIBRE 2 READER) DEVI FreeStyle Libre 2 Reader   docusate sodium (COLACE) 100 MG capsule Take 100 mg by mouth daily.   DULoxetine (CYMBALTA) 30 MG capsule TAKE 1 CAPSULE BY MOUTH EVERY NIGHT AT BEDTIME   gabapentin (NEURONTIN) 300 MG capsule Take 1 capsule (300 mg total) by mouth daily.   gabapentin (NEURONTIN)  600 MG tablet Take 1,200 mg by mouth at bedtime.   Insulin Aspart FlexPen (NOVOLOG) 100 UNIT/ML Inject 0-10 Units into the skin 3 (three) times daily before meals. Per sliding scale   insulin glargine (LANTUS) 100 UNIT/ML injection Inject 18 Units into the skin daily.   Insulin Pen Needle (PEN NEEDLES) 32G X 6 MM MISC 1 each by Does not apply route 4 (four) times daily.   isosorbide mononitrate (IMDUR) 30 MG 24 hr tablet TAKE 1 TABLET BY MOUTH ONCE DAILY   levothyroxine (SYNTHROID) 50 MCG tablet TAKE 1 TABLET BY MOUTH ONCE DAILY BEFOREBREAKFAST   losartan (COZAAR) 25 MG tablet TAKE 1/2 TABLET BY MOUTH DAILY   Multiple Vitamin (MULTIVITAMIN WITH MINERALS) TABS tablet Take 1 tablet by mouth every morning.   nitroGLYCERIN (NITROSTAT) 0.4 MG SL tablet Place 1 tablet (0.4 mg total) under the tongue every 5 (five) minutes as needed for chest pain.   Omega-3 Fatty Acids (FISH OIL) 1000 MG CAPS Take 1,000 mg by mouth 2 (two) times a day.   torsemide (DEMADEX) 20 MG tablet Take 40 mg by mouth daily.   traMADol (ULTRAM) 50 MG tablet TAKE 1 TO 2 TABLET BY MOUTH AT BEDTIME AS NEEDED   Allergies  Allergen Reactions   Heparin     Can not remember reaction   Nsaids     Kidney disease   Past Medical History:  Diagnosis Date   Acute myocardial infarction, subendocardial infarction, initial episode of care ([place]) 07/21/2012   Acute osteomyelitis involving ankle and foot ([place]) 21/30/8657   Acute systolic heart failure ([place]) 07/21/2012   New onset 07/19/12; admission to [facility] ED. Elevated Troponins.  S/p 2D-echo with EF 20-25%.  S/p cardiac catheterization with stenting LAD.  Repeat 2D-echo 10/2011 with improved EF of 35%.    Anemia    Automatic implantable cardioverter-defibrillator in situ    a. MDT CRT-D 06/2014, SN: [id number]  .removed in feb 2020   CAD (coronary artery disease)    a. cardiac cath 101/04/2012: PCI/DES to chronically occluded mLAD, consideration PCI to diag branch in 4 weeks.     Cataract    Chicken pox    Chronic kidney disease    Chronic systolic CHF (congestive heart failure) (HCC)    a. mixed ICM & NICM; b. EF 20-25% by echo 07/2012, mid-dist 2/3 of LV sev HK/AK, mild MR. echo 10/2012: EF 30-35%, sev HK ant-septal & inf walls, GR1DD, mild MR, PASP 33. c. echo 02/2013: EF 30%, GR1DD, mild MR. echo 04/2014: EF 30%, Septal-lat dyssynchrony, global HK, inf AK, GR1DD, mild MR. d. echo 10/2014: EF50-55%, WM nl, GR1DD, septal mild paradox. e. echo 02/2015: EF 50-55%, wm    Depression    Heart attack ([place])    Heel ulcer ([place]) 04/27/2015   History of blood transfusion ~ 2011   "plasma; had neuropathy; couldn't walk"  Hypertension    Hypothyroidism    LBBB (left bundle branch block)    Neuromuscular disorder ([place])    Neuropathy 2011   Obesity, unspecified    OSA on CPAP    Moderate with AHI 23/hr and now on CPAP at 16cm H2O.  Her DME is [facility]   Pure hypercholesterolemia    Sepsis ([place]) 09/2018   Type II diabetes mellitus ([place])    Unspecified vitamin D deficiency    Family History  Problem Relation Age of Onset   Diabetes Mother    Hypertension Mother    Arthritis Mother        knees, lumbar DDD, cervical DDD   Cancer Father        prostate,skin,lymphoma.   Cancer Brother 75       bladder cancer; non-smoker   Diabetes Maternal Grandmother    Heart disease Maternal Grandmother    COPD Maternal Grandmother    Diabetes Paternal Grandmother    Obesity Brother    Diabetes Son    Hypertension Son    Cancer Maternal Grandfather    Diabetes Paternal Grandfather    Past Surgical History:  Procedure Laterality Date   ABDOMINAL AORTOGRAM W/LOWER EXTREMITY N/A 02/02/2021   Procedure: ABDOMINAL AORTOGRAM W/LOWER EXTREMITY;  Surgeon: Angelia Mould, MD;  Location: [place] CV [facility];  Service: Cardiovascular;  Laterality: N/A;   AMPUTATION Left 03/02/2021   Procedure: AMPUTATION RAY, left great toe;  Surgeon: Trula Slade, DPM;  Location: [place];  Service:  Podiatry;  Laterality: Left;   AMPUTATION TOE Right 06/18/2015   Procedure: AMPUTATION TOE;  Surgeon: Samara Deist, DPM;  Location: [facility];  Service: Podiatry;  Laterality: Right;   AMPUTATION TOE Left 10/08/2018   Procedure: AMPUTATION TOE LEFT 2ND;  Surgeon: Albertine Patricia, DPM;  Location: [facility];  Service: Podiatry;  Laterality: Left;   BACK SURGERY     BI-VENTRICULAR IMPLANTABLE CARDIOVERTER DEFIBRILLATOR N/A 07/06/2014   Procedure: BI-VENTRICULAR IMPLANTABLE CARDIOVERTER DEFIBRILLATOR  (CRT-D);  Surgeon: Deboraha Sprang, MD;  Location: [facility] CATH LAB;  Service: Cardiovascular;  Laterality: N/A;   BI-VENTRICULAR IMPLANTABLE CARDIOVERTER DEFIBRILLATOR  (CRT-D)  07/06/2014   BILATERAL OOPHORECTOMY  01/2011   ovarian cyst benign   BIV ICD INSERTION CRT-D N/A 03/31/2019   Procedure: BIV ICD INSERTION CRT-D;  Surgeon: Constance Haw, MD;  Location: [place] CV LAB;  Service: Cardiovascular;  Laterality: N/A;   COLONOSCOPY WITH PROPOFOL Left 02/22/2015   Procedure: COLONOSCOPY WITH PROPOFOL;  Surgeon: Hulen Luster, MD;  Location: [facility] ENDOSCOPY;  Service: Endoscopy;  Laterality: Left;   CORONARY ANGIOPLASTY WITH STENT PLACEMENT Left 07/2012   new onset systolic CHF; elevated troponins.  Cardiac catheterization with stenting to LAD; EF 15%.  2D-echo: EF 20-25%.   ESOPHAGOGASTRODUODENOSCOPY N/A 02/22/2015   Procedure: ESOPHAGOGASTRODUODENOSCOPY (EGD);  Surgeon: Hulen Luster, MD;  Location: [facility] ENDOSCOPY;  Service: Endoscopy;  Laterality: N/A;   I & D EXTREMITY Left 11/28/2020   Procedure: IRRIGATION AND DEBRIDEMENT OF FOOT;  Surgeon: Trula Slade, DPM;  Location: [place];  Service: Podiatry;  Laterality: Left;   I & D EXTREMITY Left 01/31/2021   Procedure: IRRIGATION AND DEBRIDEMENT EXTREMITY OF LEFT FOOT WITH BONE BIOPSY OF  1ST METATARSAL AND FIFTH METATARSAL;  Surgeon: Felipa Furnace, DPM;  Location: [place];  Service: Podiatry;  Laterality: Left;   I & D EXTREMITY Left 02/03/2021   Procedure:  IRRIGATION AND DEBRIDEMENT EXTREMITY WITH INTERNAL AMPUTATION FIRST AND FIFTH RAY;  Surgeon: Boneta Lucks  P, DPM;  Location: [place];  Service: Podiatry;  Laterality: Left;   INCISION AND DRAINAGE ABSCESS Right 2007   groin; with ICU stay due to sepsis.   IR FLUORO GUIDE CV LINE RIGHT  03/06/2021   IR REMOVAL TUN CV CATH W/O FL  04/27/2021   IR US GUIDE VASC ACCESS RIGHT  03/06/2021   IRRIGATION AND DEBRIDEMENT FOOT Left 04/25/2021   Procedure: EXCISION AND DEBRIDEMENT WOUND ULCER LEFT FOOT; METATARSECTOMY;  Surgeon: Trula Slade, DPM;  Location: [facility];  Service: Podiatry;  Laterality: Left;   LAPAROSCOPIC CHOLECYSTECTOMY  2011   LEFT HEART CATH AND CORONARY ANGIOGRAPHY N/A 10/05/2018   Procedure: LEFT HEART CATH AND CORONARY ANGIOGRAPHY;  Surgeon: Minna Merritts, MD;  Location: [place] [facility];  Service: Cardiovascular;  Laterality: N/A;   LEFT HEART CATHETERIZATION WITH CORONARY ANGIOGRAM N/A 07/21/2012   Procedure: LEFT HEART CATHETERIZATION WITH CORONARY ANGIOGRAM;  Surgeon: Jolaine Artist, MD;  Location: [facility] CATH LAB;  Service: Cardiovascular;  Laterality: N/A;   LUMBAR [place]   L4-5   PACEMAKER REMOVAL  11/2018   due to infection around pacemaker   [place] (PCI-S) N/A 07/23/2012   Procedure: PERCUTANEOUS CORONARY STENT INTERVENTION (PCI-S);  Surgeon: Sherren Mocha, MD;  Location: [facility] CATH LAB;  Service: Cardiovascular;  Laterality: N/A;   PERIPHERAL VASCULAR BALLOON ANGIOPLASTY Left 10/06/2018   Procedure: PERIPHERAL VASCULAR BALLOON ANGIOPLASTY;  Surgeon: Katha Cabal, MD;  Location: [place] [facility];  Service: Cardiovascular;  Laterality: Left;   PERIPHERAL VASCULAR CATHETERIZATION N/A 02/10/2015   Procedure: Picc Line Insertion;  Surgeon: Katha Cabal, MD;  Location: [place] [facility] LAB;  Service: Cardiovascular;  Laterality: N/A;   RIGHT HEART CATH N/A 03/29/2019   Procedure: RIGHT HEART CATH;  Surgeon: Jolaine Artist, MD;  Location: [place] CV LAB;  Service: Cardiovascular;  Laterality: N/A;   TEE WITHOUT CARDIOVERSION N/A 10/02/2018   Procedure: TRANSESOPHAGEAL ECHOCARDIOGRAM (TEE);  Surgeon: Wellington Hampshire, MD;  Location: [facility];  Service: Cardiovascular;  Laterality: N/A;   [place]  01/2011   Fibroids/DUB.  Ovaries removed. [place].   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  Tobacco Use   Smoking status: Never   Smokeless tobacco: Never  Vaping Use   Vaping Use: Never used  Substance and Sexual Activity   Alcohol use: No    Alcohol/week: 0.0 standard drinks of alcohol   Drug use: No   Sexual activity: Not Currently    Birth control/protection: Post-menopausal, Surgical  Other Topics Concern   Not on file  Social History Narrative   Widowed, husband died when she was in surgery.        Children:  2 children (daughter, son). Two grandsons and 2 step grandchildren.      Lives: with husband, daughter, son-in-law, 2 grandsons, and 1 on the way.      Employment: disability for peripheral neuropathy 2012.  Previously had home daycare.   Social Determinants of Health   Financial Resource Strain: Low Risk  (03/21/2021)   Overall Emergency planning/management officer Strain (  CARDIA)    Difficulty of Paying Living Expenses: Not hard at all  Food Insecurity: No Food Insecurity (03/21/2021)   Hunger Vital Sign    Worried About Running Out of Food in the Last Year: Never true    Ran Out of Food in the Last Year: Never true  Transportation Needs: No Transportation Needs (03/21/2021)   PRAPARE - Hydrologist (Medical): No     Lack of Transportation (Non-Medical): No  Physical Activity: Inactive (03/21/2021)   Exercise Vital Sign    Days of Exercise per Week: 0 days    Minutes of Exercise per Session: 0 min  Stress: No Stress Concern Present (03/21/2021)   [place]    Feeling of Stress : Not at all  Social Connections: Somewhat Isolated (09/08/2017)   Social Connection and Isolation Panel [NHANES]    Frequency of Communication with Friends and Family: More than three times a week    Frequency of Social Gatherings with Friends and Family: More than three times a week    Attends Religious Services: Never    Marine scientist or Organizations: No    Attends Archivist Meetings: Never    Marital Status: Married  Human resources officer Violence: Not At Risk (03/21/2021)   Humiliation, Afraid, Rape, and Kick questionnaire    Fear of Current or Ex-Partner: No    Emotionally Abused: No    Physically Abused: No    Sexually Abused: No    Lipid Panel     Component Value Date/Time   CHOL 96 10/03/2021 1137   CHOL 128 09/26/2020 1216   TRIG 70.0 10/03/2021 1137   HDL 38.90 (L) 10/03/2021 1137   HDL 44 09/26/2020 1216   CHOLHDL 2 10/03/2021 1137   VLDL 14.0 10/03/2021 1137   LDLCALC 43 10/03/2021 1137   [place] 60 09/26/2020 1216   Wt Readings from Last 3 Encounters:  04/05/22 114.5 kg (252 lb 6.4 oz)  01/03/22 127 kg (280 lb)  09/24/21 127 kg (280 lb)   BP 130/64   Pulse (!) 56   Wt 114.5 kg (252 lb 6.4 oz)   SpO2 98%   BMI 38.38 kg/m   Physical Exam General:  Obese woman No resp difficulty, arrived in [facility] HEENT: normal Neck: supple. no JVD. Carotids 2+ bilat; no bruits. No lymphadenopathy or thryomegaly appreciated. Cor: PMI nondisplaced. Regular rate & rhythm. No rubs, gallops or murmurs. Lungs: clear Abdomen: obese soft, nontender, nondistended. No hepatosplenomegaly. No bruits or masses. Good bowel sounds. Extremities: no  cyanosis, clubbing, rash, tr edema Neuro: alert & orientedx3, cranial nerves grossly intact. moves all 4 extremities w/o difficulty. Affect pleasant  ECG: sinus brady 57 with biv pacing Personally reviewed  Optivol: OptiVol up in May but now back down,  no AF, 100% BiV-pacing, no VT/VF, daily activity < 1hr/day Personally reviewed  ASSESSMENT AND PLAN:   1. Chronic Systolic HF/ Mixed Ischemic/NICM w/ LBBB:  - Echo 6/20 EF 10-15%.(after extraction of CRT) - Echo 12/20 EF 35% s/p MDT CRT-D re-implant (initally removed due to sepsis).  - Echo 12/21 EF 50-55% (normalized with CRT-D) - Echo 2/23 EF 50-55%  - Stable NYHA III, limited mostly by knee pain. Volume was up earlier this month but now better - Continue torsemide 20 mg daily. Can take extra 20 PRN swelling. - Continue carvedilol 3.125 mg bid. - Continue losartan 12.5 mg daily. - Continue Imdur 30 mg daily. -  Off Jardiance with CKD (Nephrologist stopped) - No Entresto or spiro with CKD IV.  2. CKD, Stage IV:  - Followed by nephrology (Dr. Rolly Salter [facility]). - Baseline creatinine 2.0-2.5. -Recent labs ok   3. PAT & NSVT, Frequent PVCs:  - Suppressed on amiodarone 200 mg daily. - LFTS (12/22) and TSH (6/22) ok. - Denies palpitations. Followed by Dr. Caryl Comes.  4. Hypothyroidism:  - On synthroid.  5. CAD:   - s/p prior MI>>LAD DES.  - No s/s angina - On Plavix and statin.   6. OSA:  - Compliant w/ CPAP using nasal device and chin strap.  7. DM2: - Managed by PCP.  8. PVD: - s/p angioplasty and stenting of LLE 2019 - s/p CO2 ateriogram w/ angioplasty of 3 separate in-stent stenoses in left femoral artery 2022. - No new LE wounds.  - Follows with Dr. Scot Dock   9. Pre-op cardiac risk stratification: - Overall low to moderate risk for peri-op CV complication with LE block. Ok to proceed without further testing   Glori Bickers, MD  04/05/22

## 2023-01-01 ENCOUNTER — Encounter (HOSPITAL_COMMUNITY): Payer: Self-pay

## 2023-01-01 ENCOUNTER — Ambulatory Visit (HOSPITAL_COMMUNITY)
Admission: RE | Admit: 2023-01-01 | Discharge: 2023-01-01 | Disposition: A | Discharge status: Home / Self Care | Payer: PPO | Source: Ambulatory Visit | Attending: Family Medicine | Admitting: Family Medicine

## 2023-01-01 VITALS — BP 130/82 | HR 72 | Wt 260.0 lb

## 2023-01-01 DIAGNOSIS — Z79899 Other long term (current) drug therapy: Secondary | ICD-10-CM | POA: Insufficient documentation

## 2023-01-01 DIAGNOSIS — G4733 Obstructive sleep apnea (adult) (pediatric): Secondary | ICD-10-CM

## 2023-01-01 DIAGNOSIS — I5022 Chronic systolic (congestive) heart failure: Secondary | ICD-10-CM | POA: Diagnosis not present

## 2023-01-01 DIAGNOSIS — I739 Peripheral vascular disease, unspecified: Secondary | ICD-10-CM | POA: Diagnosis not present

## 2023-01-01 DIAGNOSIS — I472 Ventricular tachycardia, unspecified: Secondary | ICD-10-CM | POA: Diagnosis not present

## 2023-01-01 DIAGNOSIS — I2510 Atherosclerotic heart disease of native coronary artery without angina pectoris: Secondary | ICD-10-CM | POA: Diagnosis not present

## 2023-01-01 DIAGNOSIS — Z7902 Long term (current) use of antithrombotics/antiplatelets: Secondary | ICD-10-CM | POA: Insufficient documentation

## 2023-01-01 DIAGNOSIS — I130 Hypertensive heart and chronic kidney disease with heart failure and stage 1 through stage 4 chronic kidney disease, or unspecified chronic kidney disease: Secondary | ICD-10-CM | POA: Insufficient documentation

## 2023-01-01 DIAGNOSIS — I447 Left bundle-branch block, unspecified: Secondary | ICD-10-CM | POA: Insufficient documentation

## 2023-01-01 DIAGNOSIS — I428 Other cardiomyopathies: Secondary | ICD-10-CM | POA: Diagnosis not present

## 2023-01-01 DIAGNOSIS — N184 Chronic kidney disease, stage 4 (severe): Secondary | ICD-10-CM

## 2023-01-01 DIAGNOSIS — Z794 Long term (current) use of insulin: Secondary | ICD-10-CM | POA: Diagnosis not present

## 2023-01-01 DIAGNOSIS — E1151 Type 2 diabetes mellitus with diabetic peripheral angiopathy without gangrene: Secondary | ICD-10-CM | POA: Insufficient documentation

## 2023-01-01 DIAGNOSIS — R0602 Shortness of breath: Secondary | ICD-10-CM | POA: Insufficient documentation

## 2023-01-01 DIAGNOSIS — E039 Hypothyroidism, unspecified: Secondary | ICD-10-CM | POA: Diagnosis not present

## 2023-01-01 DIAGNOSIS — E1136 Type 2 diabetes mellitus with diabetic cataract: Secondary | ICD-10-CM | POA: Insufficient documentation

## 2023-01-01 DIAGNOSIS — E1122 Type 2 diabetes mellitus with diabetic chronic kidney disease: Secondary | ICD-10-CM | POA: Diagnosis not present

## 2023-01-01 DIAGNOSIS — I252 Old myocardial infarction: Secondary | ICD-10-CM | POA: Insufficient documentation

## 2023-01-01 DIAGNOSIS — Z79890 Hormone replacement therapy (postmenopausal): Secondary | ICD-10-CM | POA: Insufficient documentation

## 2023-01-01 DIAGNOSIS — E1142 Type 2 diabetes mellitus with diabetic polyneuropathy: Secondary | ICD-10-CM | POA: Insufficient documentation

## 2023-01-01 DIAGNOSIS — I493 Ventricular premature depolarization: Secondary | ICD-10-CM | POA: Diagnosis not present

## 2023-01-01 LAB — COMPREHENSIVE METABOLIC PANEL
ALT: 16 U/L (ref 0–44)
AST: 23 U/L (ref 15–41)
Albumin: 3.5 g/dL (ref 3.5–5.0)
Alkaline Phosphatase: 82 U/L (ref 38–126)
Anion gap: 11 (ref 5–15)
BUN: 35 mg/dL — ABNORMAL HIGH (ref 8–23)
CO2: 27 mmol/L (ref 22–32)
Calcium: 9.4 mg/dL (ref 8.9–10.3)
Chloride: 100 mmol/L (ref 98–111)
Creatinine, Ser: 2.17 mg/dL — ABNORMAL HIGH (ref 0.44–1.00)
GFR, Estimated: 24 mL/min — ABNORMAL LOW (ref >60)
Glucose, Bld: 79 mg/dL (ref 70–99)
Potassium: 4.6 mmol/L (ref 3.5–5.1)
Sodium: 138 mmol/L (ref 135–145)
Total Bilirubin: 0.6 mg/dL (ref 0.3–1.2)
Total Protein: 6.9 g/dL (ref 6.5–8.1)

## 2023-01-01 MED ORDER — NITROGLYCERIN 0.4 MG SL SUBL: 0.4000 mg | SUBLINGUAL_TABLET | SUBLINGUAL | 3 refills | Status: AC | PRN

## 2023-01-01 MED ORDER — CARVEDILOL 6.25 MG PO TABS: 6.2500 mg | ORAL_TABLET | Freq: Two times a day (BID) | ORAL | 3 refills | Status: DC

## 2023-01-01 NOTE — Patient Instructions (Addendum)
Thank you for coming in today  Labs were done today, if any labs are abnormal the clinic will call you No news is good news  EKG was done today   Medications: Increase coreg to 6.25 mg 1 tablet twice daily   Follow up appointments:  Your physician recommends that you schedule a follow-up appointment in:  6 months with Dr. Haroldine Laws  You will receive a reminder letter in the mail a few months in advance. If you don't receive a letter, please call our office to schedule the follow-up appointment.    Do the following things EVERYDAY: Weigh yourself in the morning before breakfast. Write it down and keep it in a log. Take your medicines as prescribed Eat low salt foods--Limit salt (sodium) to 2000 mg per day.  Stay as active as you can everyday Limit all fluids for the day to less than 2 liters   At the [facility], you and your health needs are our priority. As part of our continuing mission to provide you with exceptional heart care, we have created designated Provider Care Teams. These Care Teams include your primary Cardiologist (physician) and Advanced Practice Providers (APPs- Physician Assistants and Nurse Practitioners) who all work together to provide you with the care you need, when you need it.   You may see any of the following providers on your designated Care Team at your next follow up: Dr Glori Bickers Dr [name] Dr. Roxana Hires, NP Lyda Jester, [place] [facility] [place], [place] Forestine Na, NP Audry Riles, PharmD   Please be sure to bring in all your medications bottles to every appointment.    Thank you for choosing [facility]  If you have any questions or concerns before your next appointment please send [place] a message through [place] or call our office at [phone].    TO LEAVE A MESSAGE FOR THE NURSE SELECT OPTION 2, PLEASE LEAVE A MESSAGE INCLUDING: YOUR  NAME DATE OF BIRTH CALL BACK NUMBER REASON FOR CALL**this is important as we prioritize the call backs  YOU WILL RECEIVE A CALL BACK THE SAME DAY AS LONG AS YOU CALL BEFORE 4:00 PM

## 2023-01-06 ENCOUNTER — Other Ambulatory Visit: Payer: Self-pay | Admitting: Family Medicine

## 2023-01-06 NOTE — Telephone Encounter (Signed)
Refill request for TRAMADOL HYDROCHLORIDE 50MG  TABLET   LOV - 11/18/22 Next OV - not scheduled Last refill - 11/06/22 #60/1

## 2023-01-16 ENCOUNTER — Telehealth: Payer: Self-pay | Admitting: Family Medicine

## 2023-01-16 MED ORDER — NOVOLOG FLEXPEN 100 UNIT/ML ~~LOC~~ SOPN: PEN_INJECTOR | SUBCUTANEOUS | 1 refills | Status: DC

## 2023-01-16 MED ORDER — LANTUS SOLOSTAR 100 UNIT/ML ~~LOC~~ SOPN: PEN_INJECTOR | SUBCUTANEOUS | 1 refills | Status: DC

## 2023-01-16 NOTE — Telephone Encounter (Signed)
Erxs sent 

## 2023-01-16 NOTE — Telephone Encounter (Signed)
Prescription Request  01/16/2023  LOV: 11/18/2022  What is the name of the medication or equipment? insulin aspart (NOVOLOG FLEXPEN) 100 UNIT/ML FlexPen  insulin glargine (LANTUS SOLOSTAR) 100 UNIT/ML Solostar Pen  Have you contacted your pharmacy to request a refill? Yes   Which pharmacy would you like this sent to?  [facility] - [place], [place] - [address] [place] [address] Phone: [phone] Fax: [phone]    Patient notified that their request is being sent to the clinical staff for review and that they should receive a response within 2 business days.   Please advise at Mobile There is no such number on file (mobile).

## 2023-01-22 DIAGNOSIS — I480 Paroxysmal atrial fibrillation: Secondary | ICD-10-CM | POA: Diagnosis not present

## 2023-01-22 DIAGNOSIS — I25118 Atherosclerotic heart disease of native coronary artery with other forms of angina pectoris: Secondary | ICD-10-CM | POA: Diagnosis not present

## 2023-02-03 ENCOUNTER — Telehealth: Payer: Self-pay | Admitting: Family Medicine

## 2023-02-03 ENCOUNTER — Ambulatory Visit (INDEPENDENT_AMBULATORY_CARE_PROVIDER_SITE_OTHER): Payer: PPO

## 2023-02-03 DIAGNOSIS — I5022 Chronic systolic (congestive) heart failure: Secondary | ICD-10-CM

## 2023-02-03 MED ORDER — PEN NEEDLES 32G X 6 MM MISC: 1.0000 | Freq: Four times a day (QID) | 11 refills | Status: AC

## 2023-02-03 NOTE — Telephone Encounter (Signed)
Contacted [name] to schedule their annual wellness visit. Appointment made for 03/17/2023.  [facility] Care Guide [facility] AWV TEAM Direct Dial: [phone]

## 2023-02-03 NOTE — Telephone Encounter (Signed)
Erx sent

## 2023-02-03 NOTE — Telephone Encounter (Signed)
Prescription Request  02/03/2023  LOV: 11/18/2022  What is the name of the medication or equipment?   Insulin Pen Needle (PEN NEEDLES) 32G X 6 MM MISC  Have you contacted your pharmacy to request a refill? No   Which pharmacy would you like this sent to? [facility] - [place], [place] - [address] [place] [address] Phone: [phone] Fax: [phone]    Patient notified that their request is being sent to the clinical staff for review and that they should receive a response within 2 business days.   Please advise at Mobile There is no such number on file (mobile).

## 2023-02-04 LAB — CUP PACEART REMOTE DEVICE CHECK
Battery Remaining Longevity: 39 mo
Battery Voltage: 2.96 V
Brady Statistic AP VP Percent: 2.27 %
Brady Statistic AP VS Percent: 0.01 %
Brady Statistic AS VP Percent: 97.5 %
Brady Statistic AS VS Percent: 0.22 %
Brady Statistic RA Percent Paced: 2.26 %
Brady Statistic RV Percent Paced: 97.43 %
Date Time Interrogation Session: 20240429002204
HighPow Impedance: 61 Ohm
Implantable Lead Connection Status: 753985
Implantable Lead Connection Status: 753985
Implantable Lead Connection Status: 753985
Implantable Lead Implant Date: 20200625
Implantable Lead Implant Date: 20200625
Implantable Lead Implant Date: 20200625
Implantable Lead Location: 753858
Implantable Lead Location: 753859
Implantable Lead Location: 753860
Implantable Lead Model: 4398
Implantable Lead Model: 5076
Implantable Lead Model: 6935
Implantable Pulse Generator Implant Date: 20200625
Lead Channel Impedance Value: 1026 Ohm
Lead Channel Impedance Value: 1064 Ohm
Lead Channel Impedance Value: 183.214
Lead Channel Impedance Value: 183.214
Lead Channel Impedance Value: 197.755
Lead Channel Impedance Value: 285 Ohm
Lead Channel Impedance Value: 285.934
Lead Channel Impedance Value: 285.934
Lead Channel Impedance Value: 361 Ohm
Lead Channel Impedance Value: 399 Ohm
Lead Channel Impedance Value: 456 Ohm
Lead Channel Impedance Value: 513 Ohm
Lead Channel Impedance Value: 513 Ohm
Lead Channel Impedance Value: 646 Ohm
Lead Channel Impedance Value: 703 Ohm
Lead Channel Impedance Value: 703 Ohm
Lead Channel Impedance Value: 722 Ohm
Lead Channel Impedance Value: 874 Ohm
Lead Channel Pacing Threshold Amplitude: 0.625 V
Lead Channel Pacing Threshold Amplitude: 0.625 V
Lead Channel Pacing Threshold Amplitude: 2.5 V
Lead Channel Pacing Threshold Pulse Width: 0.4 ms
Lead Channel Pacing Threshold Pulse Width: 0.4 ms
Lead Channel Pacing Threshold Pulse Width: 0.8 ms
Lead Channel Sensing Intrinsic Amplitude: 0.375 mV
Lead Channel Sensing Intrinsic Amplitude: 0.375 mV
Lead Channel Sensing Intrinsic Amplitude: 19.875 mV
Lead Channel Sensing Intrinsic Amplitude: 19.875 mV
Lead Channel Setting Pacing Amplitude: 1.75 V
Lead Channel Setting Pacing Amplitude: 2 V
Lead Channel Setting Pacing Amplitude: 2.5 V
Lead Channel Setting Pacing Pulse Width: 0.4 ms
Lead Channel Setting Pacing Pulse Width: 0.8 ms
Lead Channel Setting Sensing Sensitivity: 0.3 mV
Zone Setting Status: 755011
Zone Setting Status: 755011

## 2023-02-20 ENCOUNTER — Other Ambulatory Visit (HOSPITAL_COMMUNITY): Payer: Self-pay | Admitting: Internal Medicine

## 2023-03-06 NOTE — Progress Notes (Signed)
Remote ICD transmission.   

## 2023-03-07 ENCOUNTER — Telehealth: Payer: Self-pay

## 2023-03-07 NOTE — Progress Notes (Signed)
Care Management & Coordination Services Pharmacy Team  Reason for Encounter: Appointment Reminder  Contacted patient to confirm telephone appointment with Al Corpus, PharmD on 03/12/2023 at 3:00.  Unsuccessful outreach. Left voicemail for patient to return call.  Star Rating Drugs:  Medication:  Last Fill: Day Supply Atorvastatin 10 mg 02/11/2023 90 Ozempic  PAP  Care Gaps: Annual wellness visit in last year? Yes 09/02/2022  If Diabetic: Last eye exam / retinopathy screening: Up to date Last diabetic foot exam: Up to date  Al Corpus, PharmD notified  Claudina Lick, [place] Clinical Pharmacy Assistant [phone]

## 2023-03-10 ENCOUNTER — Other Ambulatory Visit: Payer: Self-pay | Admitting: Family Medicine

## 2023-03-10 NOTE — Telephone Encounter (Signed)
Refill request for TRAMADOL HYDROCHLORIDE 50MG  TABLET   LOV - 11/25/22 Next OV - not scheduled Last refill - 01/07/23 #60/1

## 2023-03-12 ENCOUNTER — Ambulatory Visit: Payer: PPO | Admitting: Pharmacist

## 2023-03-12 NOTE — Progress Notes (Signed)
Care Management & Coordination Services Pharmacy Note  03/12/2023 Name:  [name] MRN:  [id number] DOB:  Feb 04, 1956  Summary: F/U visit -DM: A1c 6.5% (11/2022); pt is doing well with Ozempic; she is wearing Freestyle Libre and reports glucose in range most of the time, she is not able to provide readings/averages during call -HTN/HF: BP at goal in recent OV; pt endorses compliance as prescribed -CKD IV: Pt continues to hold losartan, calcitriol per nephrology recommendations; she has f/u appt upcoming this month  Recommendations: -No med changes  Follow up plan: -Health Concierge will call patient 1 month for DM update -Pharmacist follow up PRN -PCP appt due ~05/2023    SUBJECTIVE: [name] is an 67 y.o. year old female who is a primary patient of Para March, Dwana Curd, MD.  The care coordination team was consulted for assistance with disease management and care coordination needs.    Engaged with patient by telephone for follow up visit.   Recent office visits: 11/25/22 Dr Alphonsus Sias OV: acute parotitis - rx Augmentin  11/18/22 Dr Para March OV: f/u - A1c 6.5%; update gabapentin to 1200 mg HS, update Novolog; update Lantus to 8 units; update torsemide to 40 mg daily. RTC 6 months  10/28/22 NP Matt Cable OV: dog scratch. Update TD booster at pharmacy. Use vaseline on dry skin 04/23/22 Dr Para March OV: A1c 6.4. Stay off Ozempic (ran out/high cost).  01/30/22 repeat BMP - Cr improved. Repeat BMP 1 month. 01/15/22 Dr Para March OV: f/u CKD. Updated amiodarone 200 mg to 1/2 tab QOD, ferrous sulfate to QOD, Gabapentin to 300 mg daily, Novolog to SSI. Cr significnatly worse - advised to hold losartan and torsemide x 2 days, then resume. Repeat BMP 1 week.  Recent consult visits: 12/02/22 NP Hyler (Nephrology): f/u - D/C gabapentin. Start Pregabalin 25 mg BID. Continue to hold losartan.  11/26/22 Dr Allena Katz (Neurology): f/u polyradiculoneuropathy; advised to reduce gabapentin to 300 mg based on renal  function - discuss with nephrologist.  10/21/22 NP Hyler (Nephology): AKI - Cr 2.77 up from 1.9, advised compression stockings. Stop losartan for now. Repeat BMP 1 week. Decrease calcitriol to 0.25 mcg. Repeat PTH 6 weeks.  07/09/22 Dr Graciela Husbands (Cardiology): ICD, HF. Renewed NTG. Stop aspirin.   06/03/22 Dr Cherylann Ratel (Nephrology): f/u CKD 04/05/22 Dr Gala Romney (HF clinic): no changes. 03/06/22 Dr Cherylann Ratel (Nephrology): f/u CKD. GFR 23. A1c 7.7%. Hold Marcelline Deist due to low GFR. 01/03/22 Dr Edilia Bo (Vascular): new pt - PAD. S/p L leg balloon angioplasty. Preop eval for TKA today. Will require aggressive DVT ppx post-op. 11/20/21 Dr Graciela Husbands (Cardiology): f/u NICM, CHF. Decrease amiodarone to 200 mg 5x per week. Cr 2.18, GFR 24 - requires eval per PCP.    Hospital visits: 05/07/22 admission for TKA.   10/12/21 ED visit [facility]): CHF exacerbation. IV lasix in ED, temporarily increased torsemide for 5 days.   Admitted 02/2021 for cellulitis. She underwent amputation of left great toe 5/27.    Admitted 01/2021 for osteomyelitis of left foot after failing outpatient management. Had initial surgery (I&D, bone biopsy, and sesamoidectomy)   OBJECTIVE:  Lab Results  Component Value Date   CREATININE 2.17 (H) 01/01/2023   BUN 35 (H) 01/01/2023   GFR 23.72 (L) 01/30/2022   EGFR 24 (L) 11/20/2021   GFRNONAA 24 (L) 01/01/2023   GFRAA 23 (L) 11/05/2019   NA 138 01/01/2023   K 4.6 01/01/2023   CALCIUM 9.4 01/01/2023   CO2 27 01/01/2023   GLUCOSE 79 01/01/2023  Lab Results  Component Value Date/Time   HGBA1C 6.5 11/18/2022 02:51 PM   HGBA1C 6.4 (A) 04/23/2022 04:10 PM   HGBA1C 7.7 (H) 01/15/2022 03:49 PM   FRUCTOSAMINE 305 (H) 12/24/2018 05:53 PM   GFR 23.72 (L) 01/30/2022 12:43 PM   GFR 15.31 (L) 01/15/2022 03:49 PM   MICROALBUR 6.9 08/06/2016 02:32 PM   MICROALBUR 10.0 12/12/2015 03:48 PM    Last diabetic Eye exam:  Lab Results  Component Value Date/Time   HMDIABEYEEXA No Retinopathy 08/15/2022 12:00 AM     Last diabetic Foot exam: No results found for: "HMDIABFOOTEX"   Lab Results  Component Value Date   CHOL 135 11/18/2022   HDL 34.50 (L) 11/18/2022   LDLCALC 62 11/18/2022   TRIG 193.0 (H) 11/18/2022   CHOLHDL 4 11/18/2022       Latest Ref Rng & Units 01/01/2023    2:15 PM 04/10/2022   11:55 AM 10/03/2021   11:37 AM  Hepatic Function  Total Protein 6.5 - 8.1 g/dL 6.9  7.3  6.4   Albumin 3.5 - 5.0 g/dL 3.5  3.7  3.2   AST 15 - 41 U/L 23  19  10    ALT 0 - 44 U/L 16  15  7    Alk Phosphatase 38 - 126 U/L 82  76  82   Total Bilirubin 0.3 - 1.2 mg/dL 0.6  0.4  0.3     Lab Results  Component Value Date/Time   TSH 0.85 11/18/2022 02:51 PM   TSH 1.120 11/20/2021 12:32 PM   FREET4 1.19 03/22/2019 11:59 AM   FREET4 1.1 04/16/2016 04:50 PM       Latest Ref Rng & Units 05/08/2022    2:40 AM 04/10/2022   11:55 AM 01/15/2022    3:49 PM  CBC  WBC 4.0 - 10.5 K/uL 7.6  7.5  7.3   Hemoglobin 12.0 - 15.0 g/dL 9.4  16.1  9.6   Hematocrit 36.0 - 46.0 % 29.5  35.0  29.3   Platelets 150 - 400 K/uL 174  210  220.0    Iron/TIBC/Ferritin/ %Sat    Component Value Date/Time   IRON 55 04/10/2022 1155   IRON 39 02/03/2019 1611   TIBC 259 04/10/2022 1155   TIBC 285 02/03/2019 1611   FERRITIN 138.2 11/18/2022 1451   FERRITIN 239 (H) 02/03/2019 1611   IRONPCTSAT 21 04/10/2022 1155   IRONPCTSAT 14 (L) 02/03/2019 1611   IRONPCTSAT 6 (L) 12/30/2014 1840    Lab Results  Component Value Date/Time   VD25OH 32 06/01/2012 10:00 AM   VITAMINB12 490 04/10/2022 11:56 AM   VITAMINB12 415 11/22/2018 06:50 PM   Clinical ASCVD: Yes  The ASCVD Risk score (Arnett DK, et al., 2019) failed to calculate for the following reasons:   The patient has a prior MI or stroke diagnosis       11/25/2022    2:20 PM 09/02/2022   10:56 AM 03/30/2021   10:46 AM  Depression screen PHQ 2/9  Decreased Interest 0 0 0  Down, Depressed, Hopeless 1 0   PHQ - 2 Score 1 0 0  Altered sleeping 0    Tired, decreased energy 0     Change in appetite 0    Feeling bad or failure about yourself  0    Trouble concentrating 0    Moving slowly or fidgety/restless 0    Suicidal thoughts 0    PHQ-9 Score 1    Difficult doing work/chores Not difficult at  all      CHA2DS2/VAS Stroke Risk Points      6 >= 2 Points: High Risk  1 - 1.99 Points: Medium Risk  0 Points: Low Risk    Last Change: N/A     Social History   Tobacco Use  Smoking Status Never  Smokeless Tobacco Never   BP Readings from Last 3 Encounters:  01/01/23 130/82  11/26/22 124/62  11/25/22 120/62   Pulse Readings from Last 3 Encounters:  01/01/23 72  11/26/22 85  11/25/22 82   Wt Readings from Last 3 Encounters:  01/01/23 260 lb (117.9 kg)  11/26/22 262 lb (118.8 kg)  11/25/22 265 lb (120.2 kg)   BMI Readings from Last 3 Encounters:  01/01/23 39.53 kg/m  11/26/22 39.84 kg/m  11/25/22 40.29 kg/m     Medications Reviewed Today     Reviewed by Kathyrn Sheriff, RPH (Pharmacist) on 03/12/23 at 1555  Med List Status: <None>   Medication Order Taking? Sig Documenting Provider Last Dose Status Informant  acetaminophen (TYLENOL) 500 MG tablet [id number] Yes Take 2 tablets (1,000 mg total) by mouth every 6 (six) hours as needed for moderate pain or headache. Cassandria Anger, PA-C Taking Active   amiodarone (PACERONE) 200 MG tablet [id number] Yes TAKE 1/2 TABLET BY MOUTH EVERY OTHER DAY Joaquim Nam, MD Taking Active Self  atorvastatin (LIPITOR) 10 MG tablet [id number] Yes TAKE 1 TABLET BY MOUTH ONCE DAILY Joaquim Nam, MD Taking Active Self  carvedilol (COREG) 6.25 MG tablet [id number] Yes Take 1 tablet (6.25 mg total) by mouth 2 (two) times daily. [place], [place] Taking Active   clopidogrel (PLAVIX) 75 MG tablet [id number] Yes TAKE 1 TABLET BY MOUTH ONCE A DAY Duke Salvia, MD Taking Active   Continuous Blood Gluc Receiver (FREESTYLE LIBRE 2 READER) DEVI [id number] Yes Use to check sugar multiple times per day as  directed.  H/o DM2, treated with insulin. Joaquim Nam, MD Taking Active Self  Continuous Blood Gluc Receiver (FREESTYLE LIBRE 2 READER) Hardie Pulley [id number] Yes FreeStyle Libre 2 Reader [provider] Taking Active Self  Continuous Blood Gluc Sensor (FREESTYLE LIBRE 2 SENSOR) MISC [id number] Yes Use to check sugar multiple times a day as directed. Dx E11.9 Joaquim Nam, MD Taking Active   DULoxetine (CYMBALTA) 30 MG capsule [id number] Yes Take 1 capsule (30 mg total) by mouth at bedtime. Nita Sickle K, DO Taking Active   insulin aspart (NOVOLOG FLEXPEN) 100 UNIT/ML FlexPen [id number] Yes 14 units with breakfast, 5-8 units with dinner Joaquim Nam, MD Taking Active   insulin glargine (LANTUS SOLOSTAR) 100 UNIT/ML Solostar Pen [id number] Yes 8 units daily in PM Joaquim Nam, MD Taking Active   Insulin Pen Needle (PEN NEEDLES) 32G X 6 MM MISC [id number] Yes 1 each by Does not apply route 4 (four) times daily. Joaquim Nam, MD Taking Active   isosorbide mononitrate (IMDUR) 30 MG 24 hr tablet [id number] Yes TAKE 1 TABLET BY MOUTH ONCE DAILY Bensimhon, Bevelyn Buckles, MD Taking Active   levothyroxine (SYNTHROID) 50 MCG tablet [id number] Yes TAKE ONE TABLET BY MOUTH ONCE DAILY BEFORE BREAKFAST Joaquim Nam, MD Taking Active   Multiple Vitamin (MULTIVITAMIN WITH MINERALS) TABS tablet [id number] Yes Take 1 tablet by mouth every morning. [provider] Taking Active Self  nitroGLYCERIN (NITROSTAT) 0.4 MG SL tablet [id number] Yes Place 1 tablet (0.4 mg total) under the tongue every 5 (five) minutes as needed for chest pain. [place], Maynard,  FNP Taking Active   Omega-3 Fatty Acids (FISH OIL) 1000 MG CAPS [id number] Yes Take 1,000 mg by mouth 2 (two) times a day. [provider] Taking Active Self  Koren Bound test strip [id number] Yes USE AS INSTRUCTED TO CHECK BLOOD SUGAR 3TIMES DAILY Joaquim Nam, MD Taking Active   polyethylene glycol (MIRALAX / GLYCOLAX) 17 g packet  [id number] Yes Take 17 g by mouth daily as needed for mild constipation. Cassandria Anger, PA-C Taking Active   pregabalin (LYRICA) 25 MG capsule [id number] Yes Take 25 mg by mouth 2 (two) times daily. Hyler, Janine Limbo, NP Taking Active   Semaglutide,0.25 or 0.5MG /DOS, (OZEMPIC, 0.25 OR 0.5 MG/DOSE,) 2 MG/3ML SOPN [id number] Yes Inject 0.5 mg weekly Joaquim Nam, MD Taking Active   torsemide (DEMADEX) 20 MG tablet [id number] Yes TAKE ONE TABLET BY MOUTH TWICE A DAY Bensimhon, Bevelyn Buckles, MD Taking Active   traMADol (ULTRAM) 50 MG tablet [id number] Yes TAKE ONE TO TWO TABLET BY MOUTH AT BEDTIME AS NEEDED Joaquim Nam, MD Taking Active              SDOH:  (Social Determinants of Health) assessments and interventions performed: No SDOH Interventions    Flowsheet Row Office Visit from 11/25/2022 in [facility] Clinical Support from 09/02/2022 in [facility] Clinical Support from 03/21/2021 in [facility] [place]  SDOH Interventions     Food Insecurity Interventions -- Intervention Not Indicated --  Housing Interventions -- Intervention Not Indicated --  Transportation Interventions -- Intervention Not Indicated --  Utilities Interventions -- Intervention Not Indicated --  Alcohol Usage Interventions -- Intervention Not Indicated (Score <7) --  Depression Interventions/Treatment  Counseling -- PHQ2-9 Score <4 Follow-up Not Indicated  Financial Strain Interventions -- Intervention Not Indicated --  Physical Activity Interventions -- Intervention Not Indicated --  Stress Interventions -- Intervention Not Indicated --  Social Connections Interventions -- Intervention Not Indicated --      SDOH Screenings   Food Insecurity: No Food Insecurity (09/02/2022)  Housing: Low Risk  (09/02/2022)  Transportation Needs: No Transportation Needs (09/02/2022)  Utilities: Not At Risk (09/02/2022)  Alcohol  Screen: Low Risk  (09/02/2022)  Depression (PHQ2-9): Low Risk  (11/25/2022)  Financial Resource Strain: Low Risk  (09/02/2022)  Physical Activity: Inactive (09/02/2022)  Social Connections: Socially Isolated (09/02/2022)  Stress: No Stress Concern Present (09/02/2022)  Tobacco Use: Low Risk  (01/01/2023)    Medication Assistance:  Ozempic - Novo Cares PAP approved for 2024  Medication Access: Within the past 30 days, how often has patient missed a dose of medication? 0 Is a pillbox or other method used to improve adherence? Yes  Factors that may affect medication adherence? financial need Are meds synced by current pharmacy? No  Are meds delivered by current pharmacy? No  Does patient experience delays in picking up medications due to transportation concerns? No  Current Rx insurance plan: HTA Name and location of Current pharmacy:  [facility] [place] - [address] [place] [address] Phone: [phone] Fax: [phone]  [facility] [place], [place] - [address] [place] [address] Phone: [phone] Fax: [phone]   Compliance/Adherence/Medication fill history: Care Gaps: None  Star-Rating Drugs: Atorvastatin -  PDC inaccurate, LF 02/11/23 x 90 ds   ASSESSMENT / PLAN    Diabetes (A1c goal <7%) -Controlled - A1c  6.5% (11/2022) at goal; on Ozempic through PAP and is tolerating well; she reports Freestyle  Josephine Igo is in range most of the time but is not able to provide readings/averages -Pt reports rare hypoglycemia; she uses peanut butter for hypoglycemic episodes -Home BG (via [employer] - uses reader): none reported -Hx toe amputation, osteomyelitis -Current medications: Lantus 8 units daily PM-Appropriate, Effective, Query Safe Novolog 14 units AM, 5-8 units PM - Appropriate, Effective, Safe, Accessible Ozempic 0.5 mg weekly - Appropriate, Effective,  Safe, Accessible Freestyle Libre 2 (w/ reader) - Appropriate, Effective, Safe, Accessible -Medications previously tried: Trulicity, glimepiride, Jardiance, metformin, Januvia, Ozempic (cost in donut hole), Farxiga (low GFR) -Recommend to continue current medication   Heart Failure / HTN (Goal: BP < 130/80; prevent exacerbations) -Controlled - BP at goal in recent OV; -Current home BP/HR readings: none reported -Last ejection fraction: 50-55% (Date: 11/26/21); previously 10-15% in 2020 -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%); NYHA Class: I-II -Current treatment: Carvedilol 6.25 mg BID - Appropriate, Effective, Safe, Accessible Isosorbide MN 30 mg -1/2 tab daily - Appropriate, Effective, Safe, Accessible Torsemide 20 mg - 2 tab daily-  Appropriate, Effective, Safe, Accessible -Medications previously tried: Jardiance 10 mg, losartan (hyperK) -Educated on Benefits of medications for managing symptoms and prolonging life; Importance of blood pressure control -Recommend to continue current medication   CKD stage 4 / HyperPTH (Goal: prevent progression) -stable; follows with nephrology, appt upcoming June 2024 -Hx AKI 01/2022 on torsemide/losartan, improved with holding medication; recently 10/2022 she had AKI again and was instructed to hold losartan again -All medications assessed for renal dosing and appropriateness in chronic kidney disease. -Current treatment  None -Medications previously tried: calcitriol, Jardiance, losartan -Counseled on maintaining adequate hydration; importance of BP and DM control to prevent CKD progression; avoidance of NSAIDs and other nephrotoxins -Recommended to continue current medication   Hyperlipidemia: (LDL goal < 70) -Controlled - LDL 43 (09/2021) at goal; pt reports compliance with medications as prescribed; she endorses some bleeding issues - when she nicks herself it takes longer to stop bleeding, she denies blood in urine/stool -Hx CAD - MI  07/2012 w/ stent; Hx stroke -Current treatment: Atorvastatin 10 mg daily - Appropriate, Effective, Safe, Accessible Isosorbide MN 30 mg daily - Appropriate, Effective, Safe, Accessible Nitroglycerin 0.4 mg SL prn - Appropriate, Effective, Safe, Accessible Clopidogrel 75 mg daily HS - Appropriate, Effective, Safe, Accessible -Educated on Cholesterol goals; Benefits of statin for ASCVD risk reduction; -Counseled on benefits of clopidogrel/aspirin for stent patency; discussed nuisance bleeding vs major bleeding; counseled on signs/symptoms of major bleeding and when to seek emergency care -Recommended to continue current medication  Atrial Tachyardia/NSVT/AFIB (Goal: prevent stroke and major bleeding) -Controlled - pt reports palpitations have improved since being on amiodarone -Clarified Afib dx previously - pt had 1 hr of SCAF on device, no repeat events; cardiology has not recommended anticoagulation -CHADSVASC: 8; per chart review patient has never been on anticoagulation for Afib (was briefly on Eliquis for a DVT in 2016); she is currently on DAPT for hx CAD w/ stent -Current treatment: Amiodarone 200 mg - 1/2 tab QOD Appropriate, Effective, Safe, Accessible Carvedilol 6.25 mg AM - Appropriate, Effective, Safe, Accessible -Medications previously tried: Eliquis (for DVT, 2016) -Counseled on benefits of rate/rhythm control drugs to prevent tachycardia -Recommended to continue current medication  Health Maintenance -Vaccine gaps: Shingrix, covid booster, flu -Neuropathy: pregabalin 25 mg BID - Appropriate, Effective, Safe, Accessibe    Al Corpus, PharmD, [facility] Clinical Pharmacist  [facility] [phone]

## 2023-03-17 ENCOUNTER — Ambulatory Visit (INDEPENDENT_AMBULATORY_CARE_PROVIDER_SITE_OTHER): Payer: PPO

## 2023-03-17 ENCOUNTER — Other Ambulatory Visit: Payer: Self-pay | Admitting: Family Medicine

## 2023-03-17 VITALS — Ht 68.0 in | Wt 263.0 lb

## 2023-03-17 DIAGNOSIS — Z0000 Encounter for general adult medical examination without abnormal findings: Secondary | ICD-10-CM

## 2023-03-17 NOTE — Progress Notes (Signed)
I connected with  [name] on 03/17/23 by a audio enabled telemedicine application and verified that I am speaking with the correct person using two identifiers.  Patient Location: Home  Provider Location: Home Office  I discussed the limitations of evaluation and management by telemedicine. The patient expressed understanding and agreed to proceed.  Subjective:   [name] is a 67 y.o. female who presents for Medicare Annual (Subsequent) preventive examination.  Review of Systems      Cardiac Risk Factors include: advanced age (>80men, >33 women);hypertension;diabetes mellitus;sedentary lifestyle     Objective:    Today's Vitals   03/17/23 0800  Weight: 263 lb (119.3 kg)  Height: 5\' 8"  (1.727 m)   Body mass index is 39.99 kg/m.     03/17/2023    8:12 AM 11/26/2022    2:41 PM 09/02/2022   10:59 AM 06/11/2022    3:34 PM 05/20/2022   11:09 AM 05/07/2022    6:00 PM 04/26/2022    1:34 PM  Advanced Directives  Does Patient Have a Medical Advance Directive? Yes Yes Yes No No Yes Yes  Type of Estate agent of [employer];Living will Healthcare Power of [place];Living will;Out of facility DNR (pink MOST or yellow form) Healthcare Power of [place];Living will  Healthcare Power of [place];Living will;Out of facility DNR (pink MOST or yellow form) Healthcare Power of [place];Living will Living will  Does patient want to make changes to medical advance directive? No - Patient declined  No - Patient declined   No - Patient declined   Copy of Healthcare Power of Attorney in Chart? Yes - validated most recent copy scanned in chart (See row information)  Yes - validated most recent copy scanned in chart (See row information)      Would patient like information on creating a medical advance directive?    No - Patient declined       Current Medications (verified) Outpatient Encounter Medications as of 03/17/2023  Medication Sig   acetaminophen (TYLENOL) 500  MG tablet Take 2 tablets (1,000 mg total) by mouth every 6 (six) hours as needed for moderate pain or headache.   amiodarone (PACERONE) 200 MG tablet TAKE 1/2 TABLET BY MOUTH EVERY OTHER DAY   atorvastatin (LIPITOR) 10 MG tablet TAKE 1 TABLET BY MOUTH ONCE DAILY   carvedilol (COREG) 6.25 MG tablet Take 1 tablet (6.25 mg total) by mouth 2 (two) times daily.   clopidogrel (PLAVIX) 75 MG tablet TAKE 1 TABLET BY MOUTH ONCE A DAY   Continuous Blood Gluc Receiver (FREESTYLE LIBRE 2 READER) DEVI Use to check sugar multiple times per day as directed.  H/o DM2, treated with insulin.   Continuous Blood Gluc Receiver (FREESTYLE LIBRE 2 READER) DEVI FreeStyle Libre 2 Reader   Continuous Blood Gluc Sensor (FREESTYLE LIBRE 2 SENSOR) MISC Use to check sugar multiple times a day as directed. Dx E11.9   DULoxetine (CYMBALTA) 30 MG capsule Take 1 capsule (30 mg total) by mouth at bedtime.   insulin aspart (NOVOLOG FLEXPEN) 100 UNIT/ML FlexPen 14 units with breakfast, 5-8 units with dinner   insulin glargine (LANTUS SOLOSTAR) 100 UNIT/ML Solostar Pen 8 units daily in PM   Insulin Pen Needle (PEN NEEDLES) 32G X 6 MM MISC 1 each by Does not apply route 4 (four) times daily.   isosorbide mononitrate (IMDUR) 30 MG 24 hr tablet TAKE 1 TABLET BY MOUTH ONCE DAILY   levothyroxine (SYNTHROID) 50 MCG tablet TAKE ONE TABLET BY MOUTH ONCE DAILY  BEFORE BREAKFAST   Multiple Vitamin (MULTIVITAMIN WITH MINERALS) TABS tablet Take 1 tablet by mouth every morning.   Omega-3 Fatty Acids (FISH OIL) 1000 MG CAPS Take 1,000 mg by mouth 2 (two) times a day.   ONETOUCH ULTRA test strip USE AS INSTRUCTED TO CHECK BLOOD SUGAR 3TIMES DAILY   polyethylene glycol (MIRALAX / GLYCOLAX) 17 g packet Take 17 g by mouth daily as needed for mild constipation.   pregabalin (LYRICA) 25 MG capsule Take 25 mg by mouth 2 (two) times daily.   Semaglutide,0.25 or 0.5MG /DOS, (OZEMPIC, 0.25 OR 0.5 MG/DOSE,) 2 MG/3ML SOPN Inject 0.5 mg weekly   torsemide  (DEMADEX) 20 MG tablet TAKE ONE TABLET BY MOUTH TWICE A DAY   traMADol (ULTRAM) 50 MG tablet TAKE ONE TO TWO TABLET BY MOUTH AT BEDTIME AS NEEDED   nitroGLYCERIN (NITROSTAT) 0.4 MG SL tablet Place 1 tablet (0.4 mg total) under the tongue every 5 (five) minutes as needed for chest pain. (Patient not taking: Reported on 03/17/2023)   No facility-administered encounter medications on file as of 03/17/2023.    Allergies (verified) Heparin and Nsaids   History: Past Medical History:  Diagnosis Date   Acute myocardial infarction, subendocardial infarction, initial episode of care (HCC) 07/21/2012   Acute osteomyelitis involving ankle and foot (HCC) 02/06/2015   Acute systolic heart failure (HCC) 07/21/2012   New onset 07/19/12; admission to [facility] ED. Elevated Troponins.  S/p 2D-echo with EF 20-25%.  S/p cardiac catheterization with stenting LAD.  Repeat 2D-echo 10/2011 with improved EF of 35%.    Anemia    Arthritis    Automatic implantable cardioverter-defibrillator in situ    a. MDT CRT-D 06/2014, SN: [id number]  .removed in feb 2020   CAD (coronary artery disease)    a. cardiac cath 101/04/2012: PCI/DES to chronically occluded mLAD, consideration PCI to diag branch in 4 weeks.    Cataract    Chicken pox    Chronic kidney disease    Chronic systolic CHF (congestive heart failure) (HCC)    a. mixed ICM & NICM; b. EF 20-25% by echo 07/2012, mid-dist 2/3 of LV sev HK/AK, mild MR. echo 10/2012: EF 30-35%, sev HK ant-septal & inf walls, GR1DD, mild MR, PASP 33. c. echo 02/2013: EF 30%, GR1DD, mild MR. echo 04/2014: EF 30%, Septal-lat dyssynchrony, global HK, inf AK, GR1DD, mild MR. d. echo 10/2014: EF50-55%, WM nl, GR1DD, septal mild paradox. e. echo 02/2015: EF 50-55%, wm    Depression    Heart attack (HCC)    Heel ulcer (HCC) 04/27/2015   History of blood transfusion ~ 2011   "plasma; had neuropathy; couldn't walk"   Hypertension    Hypothyroidism    LBBB (left bundle branch block)     Neuromuscular disorder (HCC)    Neuropathy 2011   Obesity, unspecified    OSA on CPAP    Moderate with AHI 23/hr and now on CPAP at 16cm H2O.  Her DME is [facility]   Pure hypercholesterolemia    Sepsis (HCC) 09/2018   Type II diabetes mellitus (HCC)    Unspecified vitamin D deficiency    Past Surgical History:  Procedure Laterality Date   ABDOMINAL AORTOGRAM W/LOWER EXTREMITY N/A 02/02/2021   Procedure: ABDOMINAL AORTOGRAM W/LOWER EXTREMITY;  Surgeon: Chuck Hint, MD;  [facility] INVASIVE CV LAB;  Service: Cardiovascular;  Laterality: N/A;   AMPUTATION Left 03/02/2021   Procedure: AMPUTATION RAY, left great toe;  Surgeon: Vivi Barrack, DPM;  Location: [facility] OR;  Service: Podiatry;  Laterality: Left;   AMPUTATION TOE Right 06/18/2015   Procedure: AMPUTATION TOE;  Surgeon: Gwyneth Revels, DPM;  Location: [facility] ORS;  Service: Podiatry;  Laterality: Right;   AMPUTATION TOE Left 10/08/2018   Procedure: AMPUTATION TOE LEFT 2ND;  Surgeon: Recardo Evangelist, DPM;  Location: [facility] ORS;  Service: Podiatry;  Laterality: Left;   BACK SURGERY     BI-VENTRICULAR IMPLANTABLE CARDIOVERTER DEFIBRILLATOR N/A 07/06/2014   Procedure: BI-VENTRICULAR IMPLANTABLE CARDIOVERTER DEFIBRILLATOR  (CRT-D);  Surgeon: Duke Salvia, MD;  Location: [facility] CATH LAB;  Service: Cardiovascular;  Laterality: N/A;   BI-VENTRICULAR IMPLANTABLE CARDIOVERTER DEFIBRILLATOR  (CRT-D)  07/06/2014   BILATERAL OOPHORECTOMY  01/2011   ovarian cyst benign   BIV ICD INSERTION CRT-D N/A 03/31/2019   Procedure: BIV ICD INSERTION CRT-D;  Surgeon: Regan Lemming, MD;  Location: [facility] INVASIVE CV LAB;  Service: Cardiovascular;  Laterality: N/A;   COLONOSCOPY WITH PROPOFOL Left 02/22/2015   Procedure: COLONOSCOPY WITH PROPOFOL;  Surgeon: Wallace Cullens, MD;  Location: [facility] ENDOSCOPY;  Service: Endoscopy;  Laterality: Left;   CORONARY ANGIOPLASTY WITH STENT PLACEMENT Left 07/2012   new onset systolic CHF; elevated troponins.  Cardiac catheterization  with stenting to LAD; EF 15%.  2D-echo: EF 20-25%.   ESOPHAGOGASTRODUODENOSCOPY N/A 02/22/2015   Procedure: ESOPHAGOGASTRODUODENOSCOPY (EGD);  Surgeon: Wallace Cullens, MD;  Location: [facility] ENDOSCOPY;  Service: Endoscopy;  Laterality: N/A;   I & D EXTREMITY Left 11/28/2020   Procedure: IRRIGATION AND DEBRIDEMENT OF FOOT;  Surgeon: Vivi Barrack, DPM;  Location: [facility] OR;  Service: Podiatry;  Laterality: Left;   I & D EXTREMITY Left 01/31/2021   Procedure: IRRIGATION AND DEBRIDEMENT EXTREMITY OF LEFT FOOT WITH BONE BIOPSY OF  1ST METATARSAL AND FIFTH METATARSAL;  Surgeon: Candelaria Stagers, DPM;  Location: [facility] OR;  Service: Podiatry;  Laterality: Left;   I & D EXTREMITY Left 02/03/2021   Procedure: IRRIGATION AND DEBRIDEMENT EXTREMITY WITH INTERNAL AMPUTATION FIRST AND FIFTH RAY;  Surgeon: Candelaria Stagers, DPM;  Location: [facility] OR;  Service: Podiatry;  Laterality: Left;   INCISION AND DRAINAGE ABSCESS Right 2007   groin; with ICU stay due to sepsis.   IR FLUORO GUIDE CV LINE RIGHT  03/06/2021   IR REMOVAL TUN CV CATH W/O FL  04/27/2021   IR US GUIDE VASC ACCESS RIGHT  03/06/2021   IRRIGATION AND DEBRIDEMENT FOOT Left 04/25/2021   Procedure: EXCISION AND DEBRIDEMENT WOUND ULCER LEFT FOOT; METATARSECTOMY;  Surgeon: Vivi Barrack, DPM;  Location: [facility] ORS;  Service: Podiatry;  Laterality: Left;   LAPAROSCOPIC CHOLECYSTECTOMY  2011   LEFT HEART CATH AND CORONARY ANGIOGRAPHY N/A 10/05/2018   Procedure: LEFT HEART CATH AND CORONARY ANGIOGRAPHY;  Surgeon: Antonieta Iba, MD;  Location: [facility] INVASIVE CV LAB;  Service: Cardiovascular;  Laterality: N/A;   LEFT HEART CATHETERIZATION WITH CORONARY ANGIOGRAM N/A 07/21/2012   Procedure: LEFT HEART CATHETERIZATION WITH CORONARY ANGIOGRAM;  Surgeon: Dolores Patty, MD;  Location: [facility] CATH LAB;  Service: Cardiovascular;  Laterality: N/A;   LUMBAR DISC SURGERY  1996   L4-5   PACEMAKER REMOVAL  11/2018   due to infection around pacemaker   PERCUTANEOUS CORONARY STENT  INTERVENTION (PCI-S) N/A 07/23/2012   Procedure: PERCUTANEOUS CORONARY STENT INTERVENTION (PCI-S);  Surgeon: Tonny Bollman, MD;  Location: [facility] CATH LAB;  Service: Cardiovascular;  Laterality: N/A;   PERIPHERAL VASCULAR BALLOON ANGIOPLASTY Left 10/06/2018   Procedure: PERIPHERAL VASCULAR BALLOON ANGIOPLASTY;  Surgeon: Renford Dills, MD;  Location: [facility] INVASIVE CV LAB;  Service: Cardiovascular;  Laterality: Left;   PERIPHERAL VASCULAR CATHETERIZATION N/A 02/10/2015   Procedure: Picc Line Insertion;  Surgeon: Renford Dills, MD;  Location: [facility] INVASIVE CV LAB;  Service: Cardiovascular;  Laterality: N/A;   RIGHT HEART CATH N/A 03/29/2019   Procedure: RIGHT HEART CATH;  Surgeon: Dolores Patty, MD;  Location: [facility] INVASIVE CV LAB;  Service: Cardiovascular;  Laterality: N/A;   TEE WITHOUT CARDIOVERSION N/A 10/02/2018   Procedure: TRANSESOPHAGEAL ECHOCARDIOGRAM (TEE);  Surgeon: Iran Ouch, MD;  Location: [facility] ORS;  Service: Cardiovascular;  Laterality: N/A;   TOTAL KNEE ARTHROPLASTY Right 05/07/2022   Procedure: TOTAL KNEE ARTHROPLASTY;  Surgeon: Durene Romans, MD;  Location: [facility] ORS;  Service: Orthopedics;  Laterality: Right;   TUBAL LIGATION  1981   VAGINAL HYSTERECTOMY  01/2011   Fibroids/DUB.  Ovaries removed. [place].   Family History  Problem Relation Age of Onset   Diabetes Mother    Hypertension Mother    Arthritis Mother        knees, lumbar DDD, cervical DDD   Cancer Father        prostate,skin,lymphoma.   Cancer Brother 29       bladder cancer; non-smoker   Diabetes Maternal Grandmother    Heart disease Maternal Grandmother    COPD Maternal Grandmother    Diabetes Paternal Grandmother    Obesity Brother    Diabetes Son    Hypertension Son    Cancer Maternal Grandfather    Diabetes Paternal Grandfather    Social History   Socioeconomic History   Marital status: Widowed    Spouse name: Molly Maduro   Number of children: 2   Years of education: 12   Highest  education level: High school graduate  Occupational History   Occupation: disabled    Employer: UNEMPLOYED    Comment: 03/2010 for peripheral neuropathy   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  Tobacco Use   Smoking status: Never   Smokeless tobacco: Never  Vaping Use   Vaping Use: Never used  Substance and Sexual Activity   Alcohol use: No    Alcohol/week: 0.0 standard drinks of alcohol   Drug use: No   Sexual activity: Not Currently    Birth control/protection: Post-menopausal, Surgical  Other Topics Concern   Not on file  Social History Narrative   Widowed, husband died when she was in surgery.        Children:  2 children (daughter, son). Two grandsons and 2 step grandchildren.      Lives: with husband, daughter, son-in-law, 2 grandsons, and 1 on the way.      Employment: disability for peripheral neuropathy 2012.  Previously had home daycare.   Social Determinants of Health   Financial Resource Strain: Low Risk  (03/17/2023)   Overall Financial Resource Strain (CARDIA)    Difficulty of Paying Living Expenses: Not hard at all  Food Insecurity: No Food Insecurity (03/17/2023)   Hunger Vital Sign    Worried About Running Out of Food in the Last Year: Never true    Ran Out of Food in the Last Year: Never true  Transportation Needs: No Transportation Needs (03/17/2023)   PRAPARE - Administrator, Civil Service (Medical): No    Lack of Transportation (Non-Medical): No  Physical Activity: Inactive (03/17/2023)   Exercise Vital Sign    Days of Exercise per Week: 0 days    Minutes of Exercise per Session: 0 min  Stress: No Stress Concern  Present (03/17/2023)   [employer] of Occupational Health - Occupational Stress Questionnaire    Feeling of Stress : Not at all  Social Connections: Socially Isolated (03/17/2023)   Social Connection and Isolation Panel [NHANES]    Frequency of Communication with Friends and Family: More than three times a week     Frequency of Social Gatherings with Friends and Family: More than three times a week    Attends Religious Services: Never    Database administrator or Organizations: No    Attends Banker Meetings: Never    Marital Status: Widowed    Tobacco Counseling Counseling given: Not Answered   Clinical Intake:  Pre-visit preparation completed: Yes  Pain : No/denies pain     Nutritional Risks: None Diabetes: Yes CBG done?: Yes (111 per pt) CBG resulted in Enter/ Edit results?: No Did pt. bring in CBG monitor from home?: No  How often do you need to have someone help you when you read instructions, pamphlets, or other written materials from your doctor or pharmacy?: 1 - Never  Diabetic?Nutrition Risk Assessment:  Has the patient had any N/V/D within the last 2 months?  No  Does the patient have any non-healing wounds?  No  Has the patient had any unintentional weight loss or weight gain?  No   Diabetes:  Is the patient diabetic?  Yes  If diabetic, was a CBG obtained today?  Yes ,111 per pt Did the patient bring in their glucometer from home?  No  How often do you monitor your CBG's? QD.   Financial Strains and Diabetes Management:  Are you having any financial strains with the device, your supplies or your medication? No .  Does the patient want to be seen by Chronic Care Management for management of their diabetes?  No  Would the patient like to be referred to a Nutritionist or for Diabetic Management?  No   Diabetic Exams:  Diabetic Eye Exam: Completed 08/15/22 [facility] Diabetic Foot Exam: Completed 02/12//24 PCP    Interpreter Needed?: No  Information entered by :: C.Braley Luckenbaugh LPN   Activities of Daily Living    03/17/2023    8:13 AM 09/02/2022   10:58 AM  In your present state of health, do you have any difficulty performing the following activities:  Hearing? 0 0  Vision? 0 0  Difficulty concentrating or making decisions? 0 0  Walking or  climbing stairs? 0 0  Dressing or bathing? 0 0  Doing errands, shopping? 1 0  Comment family assists   Preparing Food and eating ? N N  Using the Toilet? N N  In the past six months, have you accidently leaked urine? N N  Do you have problems with loss of bowel control? N N  Managing your Medications? N N  Managing your Finances? N N  Housekeeping or managing your Housekeeping? N N    Patient Care Team: Joaquim Nam, MD as PCP - General (Family Medicine) Antonieta Iba, MD as PCP - Cardiology (Cardiology) Duke Salvia, MD as PCP - Electrophysiology (Cardiology) Bensimhon, Bevelyn Buckles, MD as PCP - Advanced Heart Failure (Cardiology) Nelson Chimes, MD as Consulting Physician (Ophthalmology) Lamont Dowdy, MD as Consulting Physician (Internal Medicine) Orland Jarred, Blanchie Serve (Inactive) as Attending Physician (Podiatry) Lynn Ito, MD as Consulting Physician (Infectious Diseases) Glendale Chard, DO as Consulting Physician (Neurology) Kathyrn Sheriff, [facility] as Pharmacist (Pharmacist) Rickard Patience, MD as Consulting Physician (Oncology)  Indicate any recent Medical  Services you may have received from other than [facility] providers in the past year (date may be approximate).     Assessment:   This is a routine wellness examination for [name].  Hearing/Vision screen Hearing Screening - Comments:: No hearing issues Vision Screening - Comments:: Readers - [facility]  Dietary issues and exercise activities discussed: Current Exercise Habits: Home exercise routine, Exercise limited by: None identified   Goals Addressed             This Visit's Progress    Patient Stated       Keep moving       Depression Screen    03/17/2023    8:11 AM 11/25/2022    2:20 PM 09/02/2022   10:56 AM 03/30/2021   10:46 AM 03/21/2021    9:24 AM 09/28/2020   10:36 AM 12/06/2019    2:28 PM  PHQ 2/9 Scores  PHQ - 2 Score 0 1 0 0 0 1 0  PHQ- 9 Score  1   0 7     Fall Risk     03/17/2023    8:13 AM 11/26/2022    2:41 PM 11/25/2022    2:20 PM 11/18/2022    2:13 PM 09/02/2022   10:58 AM  Fall Risk   Falls in the past year? 0 0 0 1 0  Number falls in past yr: 0 0 0 0 0  Injury with Fall? 0 0 0 0 0  Risk for fall due to : No Fall Risks  No Fall Risks Impaired mobility No Fall Risks  Follow up Falls prevention discussed;Falls evaluation completed Falls evaluation completed Falls evaluation completed Falls evaluation completed Falls prevention discussed    FALL RISK PREVENTION PERTAINING TO THE HOME:  Any stairs in or around the home? Yes  If so, are there any without handrails? No  Home free of loose throw rugs in walkways, pet beds, electrical cords, etc? Yes  Adequate lighting in your home to reduce risk of falls? Yes   ASSISTIVE DEVICES UTILIZED TO PREVENT FALLS:  Life alert? No  Use of a cane, walker or w/c? Yes  Grab bars in the bathroom? No  Shower chair or bench in shower? Yes  Elevated toilet seat or a handicapped toilet? Yes    Cognitive Function:    03/21/2021    9:25 AM  MMSE - Mini Mental State Exam  Not completed: Refused        03/17/2023    8:14 AM 09/02/2022   10:59 AM 09/08/2017   11:48 AM  6CIT Screen  What Year? 0 points 0 points 0 points  What month? 0 points 0 points 0 points  What time? 0 points 0 points 0 points  Count back from 20 0 points 0 points 0 points  Months in reverse 0 points 0 points 0 points  Repeat phrase 0 points 0 points 2 points  Total Score 0 points 0 points 2 points    Immunizations Immunization History  Administered Date(s) Administered   Fluad Quad(high Dose 65+) 10/03/2021, 08/08/2022   Hepatitis B 03/16/2013   Hepatitis B, ADULT 03/30/2014, 07/18/2014   Influenza Split 07/21/2012, 07/03/2020   Influenza, High Dose Seasonal PF 07/01/2019   Influenza,inj,Quad PF,6+ Mos 06/16/2013, 07/07/2014, 06/18/2015, 08/06/2016, 07/20/2018   Influenza-Unspecified 07/23/2017, 07/22/2018, 06/11/2019   PFIZER  Comirnaty(Gray Top)Covid-19 Tri-Sucrose Vaccine 06/21/2021   PFIZER(Purple Top)SARS-COV-2 Vaccination 11/21/2019, 12/21/2019, 07/19/2020   PNEUMOCOCCAL CONJUGATE-20 06/21/2021   Pneumococcal Polysaccharide-23 08/08/2011, 06/18/2015, 08/13/2016   Rsv, Bivalent,  Protein Subunit Rsvpref,pf Verdis Frederickson) 10/19/2022   Tdap 03/07/2010, 05/03/2016   Zoster Recombinat (Shingrix) 10/19/2022   Zoster, Live 04/06/2014    TDAP status: Up to date  Flu Vaccine status: Up to date  Pneumococcal vaccine status: Up to date  Covid-19 vaccine status: Information provided on how to obtain vaccines.   Qualifies for Shingles Vaccine? Yes   Zostavax completed No   Shingrix Completed?: Yes, per pt   Screening Tests Health Maintenance  Topic Date Due   COVID-19 Vaccine (5 - 2023-24 season) 06/07/2022   Zoster Vaccines- Shingrix (2 of 2) 12/14/2022   DEXA SCAN  09/03/2023 (Originally 02/12/2019)   INFLUENZA VACCINE  05/08/2023   HEMOGLOBIN A1C  05/19/2023   OPHTHALMOLOGY EXAM  08/16/2023   FOOT EXAM  11/19/2023   MAMMOGRAM  12/04/2024   Colonoscopy  02/21/2025   DTaP/Tdap/Td (3 - Td or Tdap) 05/03/2026   Pneumonia Vaccine 71+ Years old  Completed   Hepatitis C Screening  Completed   HPV VACCINES  Aged Out    Health Maintenance  Health Maintenance Due  Topic Date Due   COVID-19 Vaccine (5 - 2023-24 season) 06/07/2022   Zoster Vaccines- Shingrix (2 of 2) 12/14/2022    Colorectal cancer screening: Type of screening: Colonoscopy. Completed 02/22/15. Repeat every 10 years  Mammogram status: Completed 12/05/22. Repeat every year   Lung Cancer Screening: (Low Dose CT Chest recommended if Age 67-80 years, 30 pack-year currently smoking OR have quit w/in 15years.) does not qualify.   Lung Cancer Screening Referral: no  Additional Screening:  Hepatitis C Screening: does qualify; Completed 08/04/15  Vision Screening: Recommended annual ophthalmology exams for early detection of glaucoma and other  disorders of the eye. Is the patient up to date with their annual eye exam?  Yes  Who is the provider or what is the name of the office in which the patient attends annual eye exams? [facility] If pt is not established with a provider, would they like to be referred to a provider to establish care? Yes .   Dental Screening: Recommended annual dental exams for proper oral hygiene  Community Resource Referral / Chronic Care Management: CRR required this visit?  No   CCM required this visit?  No      Plan:     I have personally reviewed and noted the following in the patient's chart:   Medical and social history Use of alcohol, tobacco or illicit drugs  Current medications and supplements including opioid prescriptions. Patient is currently taking opioid prescriptions. Information provided to patient regarding non-opioid alternatives. Patient advised to discuss non-opioid treatment plan with their provider. Functional ability and status Nutritional status Physical activity Advanced directives List of other physicians Hospitalizations, surgeries, and ER visits in previous 12 months Vitals Screenings to include cognitive, depression, and falls Referrals and appointments  In addition, I have reviewed and discussed with patient certain preventive protocols, quality metrics, and best practice recommendations. A written personalized care plan for preventive services as well as general preventive health recommendations were provided to patient.     Maryan Puls, LPN   [id number]/61/0960   Nurse Notes: none

## 2023-03-17 NOTE — Patient Instructions (Signed)
[name] , Thank you for taking time to come for your Medicare Wellness Visit. I appreciate your ongoing commitment to your health goals. Please review the following plan we discussed and let me know if I can assist you in the future.   These are the goals we discussed:  Goals       DIET - REDUCE SUGAR INTAKE      Patient states that she wants to try to cut back on her sugar intake daily.       Manage My Medicine      Timeframe:  Long-Range Goal Priority:  High Start Date:     06/29/21                        Expected End Date:    06/29/22                   Follow Up Date Jan 2023   - call for medicine refill 2 or 3 days before it runs out - call if I am sick and can't take my medicine - keep a list of all the medicines I take; vitamins and herbals too - use a pillbox to sort medicine    Why is this important?   These steps will help you keep on track with your medicines.   Notes:       Monitor and Manage My Blood Sugar-Diabetes Type 2      Timeframe:  Long-Range Goal Priority:  High Start Date:      09/26/21                       Expected End Date:    09/26/22                   Follow Up Date July 2023   - check blood sugar at prescribed times - check blood sugar before and after exercise - check blood sugar if I feel it is too high or too low - take the blood sugar meter to all doctor visits -Use Freestyle Libre to monitor sugar continuously    Why is this important?   Checking your blood sugar at home helps to keep it from getting very high or very low.  Writing the results in a diary or log helps the doctor know how to care for you.  Your blood sugar log should have the time, date and the results.  Also, write down the amount of insulin or other medicine that you take.  Other information, like what you ate, exercise done and how you were feeling, will also be helpful.     Notes:       Patient Stated      03/21/2021, I will maintain and continue medications as  prescribed.       Patient Stated      Keep moving      Stay Healthy (pt-stated)        This is a list of the screening recommended for you and due dates:  Health Maintenance  Topic Date Due   COVID-19 Vaccine (5 - 2023-24 season) 06/07/2022   Zoster (Shingles) Vaccine (2 of 2) 12/14/2022   DEXA scan (bone density measurement)  09/03/2023*   Flu Shot  05/08/2023   Hemoglobin A1C  05/19/2023   Eye exam for diabetics  08/16/2023   Complete foot exam   11/19/2023   Mammogram  12/04/2024   Colon Cancer  Screening  02/21/2025   DTaP/Tdap/Td vaccine (3 - Td or Tdap) 05/03/2026   Pneumonia Vaccine  Completed   Hepatitis C Screening  Completed   HPV Vaccine  Aged Out  *Topic was postponed. The date shown is not the original due date.   Managing Pain Without Opioids Opioids are strong medicines used to treat moderate to severe pain. For some people, especially those who have long-term (chronic) pain, opioids may not be the best choice for pain management due to: Side effects like nausea, constipation, and sleepiness. The risk of addiction (opioid use disorder). The longer you take opioids, the greater your risk of addiction. Pain that lasts for more than 3 months is called chronic pain. Managing chronic pain usually requires more than one approach and is often provided by a team of health care providers working together (multidisciplinary approach). Pain management may be done at a pain management center or pain clinic. How to manage pain without the use of opioids Use non-opioid medicines Non-opioid medicines for pain may include: Over-the-counter or prescription non-steroidal anti-inflammatory drugs (NSAIDs). These may be the first medicines used for pain. They work well for muscle and bone pain, and they reduce swelling. Acetaminophen. This over-the-counter medicine may work well for milder pain but not swelling. Antidepressants. These may be used to treat chronic pain. A certain type of  antidepressant (tricyclics) is often used. These medicines are given in lower doses for pain than when used for depression. Anticonvulsants. These are usually used to treat seizures but may also reduce nerve (neuropathic) pain. Muscle relaxants. These relieve pain caused by sudden muscle tightening (spasms). You may also use a pain medicine that is applied to the skin as a patch, cream, or gel (topical analgesic), such as a numbing medicine. These may cause fewer side effects than medicines taken by mouth. Do certain therapies as directed Some therapies can help with pain management. They include: Physical therapy. You will do exercises to gain strength and flexibility. A physical therapist may teach you exercises to move and stretch parts of your body that are weak, stiff, or painful. You can learn these exercises at physical therapy visits and practice them at home. Physical therapy may also involve: Massage. Heat wraps or applying heat or cold to affected areas. Electrical signals that interrupt pain signals (transcutaneous electrical nerve stimulation, TENS). Weak lasers that reduce pain and swelling (low-level laser therapy). Signals from your body that help you learn to regulate pain (biofeedback). Occupational therapy. This helps you to learn ways to function at home and work with less pain. Recreational therapy. This involves trying new activities or hobbies, such as a physical activity or drawing. Mental health therapy, including: Cognitive behavioral therapy (CBT). This helps you learn coping skills for dealing with pain. Acceptance and commitment therapy (ACT) to change the way you think and react to pain. Relaxation therapies, including muscle relaxation exercises and mindfulness-based stress reduction. Pain management counseling. This may be individual, family, or group counseling.  Receive medical treatments Medical treatments for pain management include: Nerve block injections.  These may include a pain blocker and anti-inflammatory medicines. You may have injections: Near the spine to relieve chronic back or neck pain. Into joints to relieve back or joint pain. Into nerve areas that supply a painful area to relieve body pain. Into muscles (trigger point injections) to relieve some painful muscle conditions. A medical device placed near your spine to help block pain signals and relieve nerve pain or chronic back pain (spinal cord  stimulation device). Acupuncture. Follow these instructions at home Medicines Take over-the-counter and prescription medicines only as told by your health care provider. If you are taking pain medicine, ask your health care providers about possible side effects to watch out for. Do not drive or use heavy machinery while taking prescription opioid pain medicine. Lifestyle  Do not use drugs or alcohol to reduce pain. If you drink alcohol, limit how much you have to: 0-1 drink a day for women who are not pregnant. 0-2 drinks a day for men. Know how much alcohol is in a drink. In the [place] one drink equals one 12 oz bottle of beer (355 mL), one 5 oz glass of wine (148 mL), or one 1 oz glass of hard liquor (44 mL). Do not use any products that contain nicotine or tobacco. These products include cigarettes, chewing tobacco, and vaping devices, such as e-cigarettes. If you need help quitting, ask your health care provider. Eat a healthy diet and maintain a healthy weight. Poor diet and excess weight may make pain worse. Eat foods that are high in fiber. These include fresh fruits and vegetables, whole grains, and beans. Limit foods that are high in fat and processed sugars, such as fried and sweet foods. Exercise regularly. Exercise lowers stress and may help relieve pain. Ask your health care provider what activities and exercises are safe for you. If your health care provider approves, join an exercise class that combines movement and stress  reduction. Examples include yoga and tai chi. Get enough sleep. Lack of sleep may make pain worse. Lower stress as much as possible. Practice stress reduction techniques as told by your therapist. General instructions Work with all your pain management providers to find the treatments that work best for you. You are an important member of your pain management team. There are many things you can do to reduce pain on your own. Consider joining an online or in-person support group for people who have chronic pain. Keep all follow-up visits. This is important. Where to find more information You can find more information about managing pain without opioids from: American Academy of Pain Medicine: painmed.org Institute for Chronic Pain: instituteforchronicpain.org American Chronic Pain Association: theacpa.org Contact a health care provider if: You have side effects from pain medicine. Your pain gets worse or does not get better with treatments or home therapy. You are struggling with anxiety or depression. Summary Many types of pain can be managed without opioids. Chronic pain may respond better to pain management without opioids. Pain is best managed when you and a team of health care providers work together. Pain management without opioids may include non-opioid medicines, medical treatments, physical therapy, mental health therapy, and lifestyle changes. Tell your health care providers if your pain gets worse or is not being managed well enough. This information is not intended to replace advice given to you by your health care provider. Make sure you discuss any questions you have with your health care provider. Document Revised: 01/03/2021 Document Reviewed: 01/03/2021 Elsevier Patient Education  2024 Elsevier Inc.   Advanced directives: has copy on file  Conditions/risks identified: Aim for 30 minutes of exercise or brisk walking, 6-8 glasses of water, and 5 servings of fruits and  vegetables each day.   Next appointment: Follow up in one year for your annual wellness visit 03/17/24 @ 8:15 telephone   Preventive Care 65 Years and Older, Female Preventive care refers to lifestyle choices and visits with your health care provider that  can promote health and wellness. What does preventive care include? A yearly physical exam. This is also called an annual well check. Dental exams once or twice a year. Routine eye exams. Ask your health care provider how often you should have your eyes checked. Personal lifestyle choices, including: Daily care of your teeth and gums. Regular physical activity. Eating a healthy diet. Avoiding tobacco and drug use. Limiting alcohol use. Practicing safe sex. Taking low-dose aspirin every day. Taking vitamin and mineral supplements as recommended by your health care provider. What happens during an annual well check? The services and screenings done by your health care provider during your annual well check will depend on your age, overall health, lifestyle risk factors, and family history of disease. Counseling  Your health care provider may ask you questions about your: Alcohol use. Tobacco use. Drug use. Emotional well-being. Home and relationship well-being. Sexual activity. Eating habits. History of falls. Memory and ability to understand (cognition). Work and work Astronomer. Reproductive health. Screening  You may have the following tests or measurements: Height, weight, and BMI. Blood pressure. Lipid and cholesterol levels. These may be checked every 5 years, or more frequently if you are over 15 years old. Skin check. Lung cancer screening. You may have this screening every year starting at age 71 if you have a 30-pack-year history of smoking and currently smoke or have quit within the past 15 years. Fecal occult blood test (FOBT) of the stool. You may have this test every year starting at age 43. Flexible  sigmoidoscopy or colonoscopy. You may have a sigmoidoscopy every 5 years or a colonoscopy every 10 years starting at age 47. Hepatitis C blood test. Hepatitis B blood test. Sexually transmitted disease (STD) testing. Diabetes screening. This is done by checking your blood sugar (glucose) after you have not eaten for a while (fasting). You may have this done every 1-3 years. Bone density scan. This is done to screen for osteoporosis. You may have this done starting at age 19. Mammogram. This may be done every 1-2 years. Talk to your health care provider about how often you should have regular mammograms. Talk with your health care provider about your test results, treatment options, and if necessary, the need for more tests. Vaccines  Your health care provider may recommend certain vaccines, such as: Influenza vaccine. This is recommended every year. Tetanus, diphtheria, and acellular pertussis (Tdap, Td) vaccine. You may need a Td booster every 10 years. Zoster vaccine. You may need this after age 47. Pneumococcal 13-valent conjugate (PCV13) vaccine. One dose is recommended after age 34. Pneumococcal polysaccharide (PPSV23) vaccine. One dose is recommended after age 32. Talk to your health care provider about which screenings and vaccines you need and how often you need them. This information is not intended to replace advice given to you by your health care provider. Make sure you discuss any questions you have with your health care provider. Document Released: 10/20/2015 Document Revised: 06/12/2016 Document Reviewed: 07/25/2015 Elsevier Interactive Patient Education  2017 [employer].  Fall Prevention in the Home Falls can cause injuries. They can happen to people of all ages. There are many things you can do to make your home safe and to help prevent falls. What can I do on the outside of my home? Regularly fix the edges of walkways and driveways and fix any cracks. Remove anything that  might make you trip as you walk through a door, such as a raised step or threshold. Trim any  bushes or trees on the path to your home. Use bright outdoor lighting. Clear any walking paths of anything that might make someone trip, such as rocks or tools. Regularly check to see if handrails are loose or broken. Make sure that both sides of any steps have handrails. Any raised decks and porches should have guardrails on the edges. Have any leaves, snow, or ice cleared regularly. Use sand or salt on walking paths during winter. Clean up any spills in your garage right away. This includes oil or grease spills. What can I do in the bathroom? Use night lights. Install grab bars by the toilet and in the tub and shower. Do not use towel bars as grab bars. Use non-skid mats or decals in the tub or shower. If you need to sit down in the shower, use a plastic, non-slip stool. Keep the floor dry. Clean up any water that spills on the floor as soon as it happens. Remove soap buildup in the tub or shower regularly. Attach bath mats securely with double-sided non-slip rug tape. Do not have throw rugs and other things on the floor that can make you trip. What can I do in the bedroom? Use night lights. Make sure that you have a light by your bed that is easy to reach. Do not use any sheets or blankets that are too big for your bed. They should not hang down onto the floor. Have a firm chair that has side arms. You can use this for support while you get dressed. Do not have throw rugs and other things on the floor that can make you trip. What can I do in the kitchen? Clean up any spills right away. Avoid walking on wet floors. Keep items that you use a lot in easy-to-reach places. If you need to reach something above you, use a strong step stool that has a grab bar. Keep electrical cords out of the way. Do not use floor polish or wax that makes floors slippery. If you must use wax, use non-skid floor  wax. Do not have throw rugs and other things on the floor that can make you trip. What can I do with my stairs? Do not leave any items on the stairs. Make sure that there are handrails on both sides of the stairs and use them. Fix handrails that are broken or loose. Make sure that handrails are as long as the stairways. Check any carpeting to make sure that it is firmly attached to the stairs. Fix any carpet that is loose or worn. Avoid having throw rugs at the top or bottom of the stairs. If you do have throw rugs, attach them to the floor with carpet tape. Make sure that you have a light switch at the top of the stairs and the bottom of the stairs. If you do not have them, ask someone to add them for you. What else can I do to help prevent falls? Wear shoes that: Do not have high heels. Have rubber bottoms. Are comfortable and fit you well. Are closed at the toe. Do not wear sandals. If you use a stepladder: Make sure that it is fully opened. Do not climb a closed stepladder. Make sure that both sides of the stepladder are locked into place. Ask someone to hold it for you, if possible. Clearly mark and make sure that you can see: Any grab bars or handrails. First and last steps. Where the edge of each step is. Use tools that help  you move around (mobility aids) if they are needed. These include: Canes. Walkers. Scooters. Crutches. Turn on the lights when you go into a dark area. Replace any light bulbs as soon as they burn out. Set up your furniture so you have a clear path. Avoid moving your furniture around. If any of your floors are uneven, fix them. If there are any pets around you, be aware of where they are. Review your medicines with your doctor. Some medicines can make you feel dizzy. This can increase your chance of falling. Ask your doctor what other things that you can do to help prevent falls. This information is not intended to replace advice given to you by your  health care provider. Make sure you discuss any questions you have with your health care provider. Document Released: 07/20/2009 Document Revised: 02/29/2016 Document Reviewed: 10/28/2014 Elsevier Interactive Patient Education  2017 [employer].

## 2023-03-19 DIAGNOSIS — I509 Heart failure, unspecified: Secondary | ICD-10-CM | POA: Diagnosis not present

## 2023-03-19 DIAGNOSIS — N2581 Secondary hyperparathyroidism of renal origin: Secondary | ICD-10-CM | POA: Diagnosis not present

## 2023-03-19 DIAGNOSIS — D631 Anemia in chronic kidney disease: Secondary | ICD-10-CM | POA: Diagnosis not present

## 2023-03-19 DIAGNOSIS — E1122 Type 2 diabetes mellitus with diabetic chronic kidney disease: Secondary | ICD-10-CM | POA: Diagnosis not present

## 2023-03-19 DIAGNOSIS — I10 Essential (primary) hypertension: Secondary | ICD-10-CM | POA: Diagnosis not present

## 2023-03-25 DIAGNOSIS — E1122 Type 2 diabetes mellitus with diabetic chronic kidney disease: Secondary | ICD-10-CM | POA: Diagnosis not present

## 2023-03-25 DIAGNOSIS — E875 Hyperkalemia: Secondary | ICD-10-CM | POA: Diagnosis not present

## 2023-03-25 DIAGNOSIS — I129 Hypertensive chronic kidney disease with stage 1 through stage 4 chronic kidney disease, or unspecified chronic kidney disease: Secondary | ICD-10-CM | POA: Diagnosis not present

## 2023-03-25 DIAGNOSIS — N2581 Secondary hyperparathyroidism of renal origin: Secondary | ICD-10-CM | POA: Diagnosis not present

## 2023-03-25 DIAGNOSIS — I10 Essential (primary) hypertension: Secondary | ICD-10-CM | POA: Diagnosis not present

## 2023-03-25 DIAGNOSIS — I509 Heart failure, unspecified: Secondary | ICD-10-CM | POA: Diagnosis not present

## 2023-03-25 DIAGNOSIS — D631 Anemia in chronic kidney disease: Secondary | ICD-10-CM | POA: Diagnosis not present

## 2023-03-25 DIAGNOSIS — N184 Chronic kidney disease, stage 4 (severe): Secondary | ICD-10-CM | POA: Diagnosis not present

## 2023-03-25 DIAGNOSIS — R768 Other specified abnormal immunological findings in serum: Secondary | ICD-10-CM | POA: Diagnosis not present

## 2023-03-25 DIAGNOSIS — R809 Proteinuria, unspecified: Secondary | ICD-10-CM | POA: Diagnosis not present

## 2023-03-27 ENCOUNTER — Other Ambulatory Visit: Payer: Self-pay | Admitting: Family Medicine

## 2023-04-25 ENCOUNTER — Telehealth: Payer: Self-pay

## 2023-04-25 NOTE — Telephone Encounter (Signed)
Received patient Ozempic. Placed in fridge. Not able to leave message for patient.

## 2023-04-28 ENCOUNTER — Telehealth: Payer: Self-pay

## 2023-04-28 NOTE — Telephone Encounter (Signed)
No answer or voice mail for patient to schedule follow up appointment with office for chronic care.  [name], [facility] Health Specialist

## 2023-04-30 ENCOUNTER — Ambulatory Visit: Payer: PPO | Attending: Internal Medicine

## 2023-04-30 ENCOUNTER — Telehealth: Payer: Self-pay

## 2023-04-30 DIAGNOSIS — I5023 Acute on chronic systolic (congestive) heart failure: Secondary | ICD-10-CM | POA: Diagnosis not present

## 2023-04-30 LAB — CUP PACEART INCLINIC DEVICE CHECK
Date Time Interrogation Session: 20240724171308
Implantable Lead Connection Status: 753985
Implantable Lead Connection Status: 753985
Implantable Lead Connection Status: 753985
Implantable Lead Implant Date: 20200625
Implantable Lead Implant Date: 20200625
Implantable Lead Implant Date: 20200625
Implantable Lead Location: 753858
Implantable Lead Location: 753859
Implantable Lead Location: 753860
Implantable Lead Model: 4398
Implantable Lead Model: 5076
Implantable Lead Model: 6935
Implantable Pulse Generator Implant Date: 20200625

## 2023-04-30 NOTE — Telephone Encounter (Signed)
Spoke to [name] who advised concerns for loss of capture on LV lead. Also note LV threshold is very close to LV output. Spoke to [place], [place] in hospital and she is available to see patient if needed. Patient to come in DC today @ 3:30 for apt.

## 2023-04-30 NOTE — Progress Notes (Signed)
CRT-D device check in office. Thresholds and sensing consistent with previous device measurements. Lead impedance trends stable over time. No mode switch episodes recorded. No ventricular arrhythmia episodes recorded. Patient bi-ventricularly pacing 81.4% of the time. Device programmed with appropriate safety margins. Heart failure diagnostics reviewed and trends are stable for patient. No changes made this session. Estimated longevity _2.4 years.  Patient enrolled in remote follow up 05/05/23. Plan to check device remotely in 3 months and see in office in 6 months. Patient education completed. Assisted by Del with [employer] rep and Luster Landsberg, PA updated.   LV output programmed from 2.0 V to 3.0 V & LV pulse width programmed from 0.8 ms to 1.0 ms.

## 2023-04-30 NOTE — Telephone Encounter (Signed)
The patient states her ICD making noise. She states she can't lay on her left side or her heart speeds up. I asked her to send a manual transmission with her home remote monitor for the nurse to review. Transmission received.

## 2023-04-30 NOTE — Telephone Encounter (Signed)
Waiting on Del, [employer] rep to come from hospital to review transmission for further interpretation.

## 2023-05-05 ENCOUNTER — Ambulatory Visit (INDEPENDENT_AMBULATORY_CARE_PROVIDER_SITE_OTHER): Payer: PPO

## 2023-05-05 DIAGNOSIS — I5022 Chronic systolic (congestive) heart failure: Secondary | ICD-10-CM

## 2023-05-05 LAB — CUP PACEART REMOTE DEVICE CHECK
Battery Remaining Longevity: 33 mo
Battery Voltage: 2.95 V
Brady Statistic AP VP Percent: 0.45 %
Brady Statistic AP VS Percent: 0.01 %
Brady Statistic AS VP Percent: 99.43 %
Brady Statistic AS VS Percent: 0.1 %
Brady Statistic RA Percent Paced: 0.46 %
Brady Statistic RV Percent Paced: 99.48 %
Date Time Interrogation Session: 20240729022707
HighPow Impedance: 66 Ohm
Implantable Lead Connection Status: 753985
Implantable Lead Connection Status: 753985
Implantable Lead Connection Status: 753985
Implantable Lead Implant Date: 20200625
Implantable Lead Implant Date: 20200625
Implantable Lead Implant Date: 20200625
Implantable Lead Location: 753858
Implantable Lead Location: 753859
Implantable Lead Location: 753860
Implantable Lead Model: 4398
Implantable Lead Model: 5076
Implantable Lead Model: 6935
Implantable Pulse Generator Implant Date: 20200625
Lead Channel Impedance Value: 1064 Ohm
Lead Channel Impedance Value: 1064 Ohm
Lead Channel Impedance Value: 185.581
Lead Channel Impedance Value: 185.581
Lead Channel Impedance Value: 197.755
Lead Channel Impedance Value: 285 Ohm
Lead Channel Impedance Value: 291.742
Lead Channel Impedance Value: 291.742
Lead Channel Impedance Value: 399 Ohm
Lead Channel Impedance Value: 399 Ohm
Lead Channel Impedance Value: 456 Ohm
Lead Channel Impedance Value: 532 Ohm
Lead Channel Impedance Value: 532 Ohm
Lead Channel Impedance Value: 646 Ohm
Lead Channel Impedance Value: 703 Ohm
Lead Channel Impedance Value: 703 Ohm
Lead Channel Impedance Value: 703 Ohm
Lead Channel Impedance Value: 836 Ohm
Lead Channel Pacing Threshold Amplitude: 0.5 V
Lead Channel Pacing Threshold Amplitude: 0.625 V
Lead Channel Pacing Threshold Amplitude: 2.5 V
Lead Channel Pacing Threshold Pulse Width: 0.4 ms
Lead Channel Pacing Threshold Pulse Width: 0.4 ms
Lead Channel Pacing Threshold Pulse Width: 1 ms
Lead Channel Sensing Intrinsic Amplitude: 0.75 mV
Lead Channel Sensing Intrinsic Amplitude: 0.75 mV
Lead Channel Sensing Intrinsic Amplitude: 19.25 mV
Lead Channel Sensing Intrinsic Amplitude: 19.25 mV
Lead Channel Setting Pacing Amplitude: 1.75 V
Lead Channel Setting Pacing Amplitude: 2 V
Lead Channel Setting Pacing Amplitude: 3 V
Lead Channel Setting Pacing Pulse Width: 0.4 ms
Lead Channel Setting Pacing Pulse Width: 1 ms
Lead Channel Setting Sensing Sensitivity: 0.3 mV
Zone Setting Status: 755011
Zone Setting Status: 755011

## 2023-05-06 ENCOUNTER — Other Ambulatory Visit: Payer: Self-pay | Admitting: Family Medicine

## 2023-05-06 NOTE — Telephone Encounter (Signed)
Refill request for  TRAMADOL HYDROCHLORIDE 50MG  TABLET   LOV - 11/18/22 Next OV - not scheduled Last refill - 03/11/23 #60/1

## 2023-05-07 NOTE — Telephone Encounter (Signed)
Sent. Thanks.   

## 2023-05-13 NOTE — Telephone Encounter (Signed)
Called pt to inform her of Ozempic to be picked up. Pt states that she will come by today to get the medication.  [name], CMA

## 2023-05-14 ENCOUNTER — Encounter: Payer: Self-pay | Admitting: Pharmacist

## 2023-05-14 NOTE — Progress Notes (Signed)
Pharmacy Quality Measure Review  This patient is appearing on a report for being at risk of failing the adherence measure for cholesterol (statin) medications this calendar year.   Medication: atorvastatin 10mg  Last fill date: 03/17/2023 for 30 day supply- per [facility] adherence report.   Reviewed Dr Tiajuana Amass database. Unable to locate refill records for [name].  Called her pharmacy - [facility].  Verified patient did fill atorvastatin 10mg  on the following days recently - 03/17/2023, 04/15/2023 and 05/12/2023 for 30 day supply. Verified atorvastatin was run with [employer] benefits and meds were delivered to patient.   No patient outreach needed at this time.   Henrene Pastor, PharmD Clinical Pharmacist [facility]  [facility]

## 2023-05-19 ENCOUNTER — Other Ambulatory Visit: Payer: Self-pay | Admitting: Neurology

## 2023-05-21 ENCOUNTER — Other Ambulatory Visit (HOSPITAL_COMMUNITY): Payer: Self-pay | Admitting: Internal Medicine

## 2023-05-21 NOTE — Progress Notes (Signed)
Remote ICD transmission.   

## 2023-06-04 ENCOUNTER — Other Ambulatory Visit: Payer: Self-pay | Admitting: Neurology

## 2023-06-17 ENCOUNTER — Other Ambulatory Visit: Payer: Self-pay | Admitting: Family Medicine

## 2023-07-02 DIAGNOSIS — N184 Chronic kidney disease, stage 4 (severe): Secondary | ICD-10-CM | POA: Diagnosis not present

## 2023-07-02 DIAGNOSIS — N2581 Secondary hyperparathyroidism of renal origin: Secondary | ICD-10-CM | POA: Diagnosis not present

## 2023-07-02 DIAGNOSIS — E1122 Type 2 diabetes mellitus with diabetic chronic kidney disease: Secondary | ICD-10-CM | POA: Diagnosis not present

## 2023-07-02 DIAGNOSIS — R768 Other specified abnormal immunological findings in serum: Secondary | ICD-10-CM | POA: Diagnosis not present

## 2023-07-02 DIAGNOSIS — E875 Hyperkalemia: Secondary | ICD-10-CM | POA: Diagnosis not present

## 2023-07-02 DIAGNOSIS — D631 Anemia in chronic kidney disease: Secondary | ICD-10-CM | POA: Diagnosis not present

## 2023-07-02 DIAGNOSIS — R809 Proteinuria, unspecified: Secondary | ICD-10-CM | POA: Diagnosis not present

## 2023-07-02 DIAGNOSIS — I129 Hypertensive chronic kidney disease with stage 1 through stage 4 chronic kidney disease, or unspecified chronic kidney disease: Secondary | ICD-10-CM | POA: Diagnosis not present

## 2023-07-09 ENCOUNTER — Other Ambulatory Visit: Payer: Self-pay | Admitting: Family Medicine

## 2023-07-09 NOTE — Telephone Encounter (Signed)
Sent.   Please schedule DM f/u.  We can do labs at the visit.  Thanks.

## 2023-07-28 ENCOUNTER — Other Ambulatory Visit: Payer: Self-pay | Admitting: Family Medicine

## 2023-07-29 ENCOUNTER — Encounter: Payer: Self-pay | Admitting: Internal Medicine

## 2023-07-29 ENCOUNTER — Ambulatory Visit
Admission: RE | Admit: 2023-07-29 | Discharge: 2023-07-29 | Disposition: A | Discharge status: Home / Self Care | Payer: PPO | Source: Ambulatory Visit | Attending: Internal Medicine | Admitting: Internal Medicine

## 2023-07-29 ENCOUNTER — Ambulatory Visit: Payer: PPO | Attending: Internal Medicine | Admitting: Internal Medicine

## 2023-07-29 VITALS — BP 98/58 | HR 74 | Ht 68.0 in | Wt 253.0 lb

## 2023-07-29 DIAGNOSIS — Z79899 Other long term (current) drug therapy: Secondary | ICD-10-CM | POA: Diagnosis not present

## 2023-07-29 DIAGNOSIS — I493 Ventricular premature depolarization: Secondary | ICD-10-CM | POA: Diagnosis not present

## 2023-07-29 DIAGNOSIS — Z955 Presence of coronary angioplasty implant and graft: Secondary | ICD-10-CM | POA: Diagnosis not present

## 2023-07-29 DIAGNOSIS — Z95810 Presence of automatic (implantable) cardiac defibrillator: Secondary | ICD-10-CM | POA: Diagnosis not present

## 2023-07-29 DIAGNOSIS — I5022 Chronic systolic (congestive) heart failure: Secondary | ICD-10-CM

## 2023-07-29 MED ORDER — CARVEDILOL 3.125 MG PO TABS: 3.1250 mg | ORAL_TABLET | Freq: Two times a day (BID) | ORAL | 3 refills | Status: DC

## 2023-07-29 NOTE — Progress Notes (Unsigned)
Patient ID: [name], female   DOB: 1956/01/14, 67 y.o.   MRN: [id number]      Patient Care Team: Joaquim Nam, MD as PCP - General (Family Medicine) Antonieta Iba, MD as PCP - Cardiology (Cardiology) Duke Salvia, MD as PCP - Electrophysiology (Cardiology) Bensimhon, Bevelyn Buckles, MD as PCP - Advanced Heart Failure (Cardiology) Nelson Chimes, MD as Consulting Physician (Ophthalmology) Lamont Dowdy, MD as Consulting Physician (Internal Medicine) Orland Jarred, Blanchie Serve (Inactive) as Attending Physician (Podiatry) Lynn Ito, MD as Consulting Physician (Infectious Diseases) Glendale Chard, DO as Consulting Physician (Neurology) Kathyrn Sheriff, [facility] (Inactive) as Pharmacist (Pharmacist) Rickard Patience, MD as Consulting Physician (Oncology)   HPI  [name] is a 67 y.o. female CRT-D implanted for nonischemic cardiomyopathy with concomitant coronary disease.. Developed recurrent MSSA bacteremia and underwent system extraction 3/20 with aspiration of the thrombus of the lead.  Recurrent heart failure and interval worsening of LV function prompted decision to reimplant done [facility]) 6/20  Following reimplantation LV function has improved considerably (see below)  History of atrial tachycardia and PVCs  for which she has been treated with low dose amiodarone. Patient denies symptoms of  GI intolerance, sun sensitivity, neurological symptoms attributable to amiodarone.     no coughing  The patient denies chest pain, shortness of breath, nocturnal dyspnea, orthopnea or peripheral edema.  There have been no palpitations or syncope.   Significant lightheadedness.  Addressed by renal with changing her carvedilol to once a day and discontinued her isosorbide with some improvement.  Still with orthostatic dizziness.  Chronic modest shortness of breath with interval worsening;    Interval  development of diaphragmatic stimulation.  Last chest x-ray 1/23 compared with  post implant without significant change.  DATE TEST EF%    9/15  echo  30 %    1 /17 echo  50 %    12/19 Echo  45-50%    12/19 TEE   No vegetations  12/19 LHC   pLAD80 with ISR; mLAD70 pCx 80;D1 90 mRCA 40  2/20 CT     2/20 TEE    3/20  (2) Echo  40%   6/20 Echo  10-15%   12/20 Echo 35%   6/21 Echo  50-55%   2/23 Echo  50-55%     Date Cr K Hgb TSH LFTs   LDL  3/20 3.54>>1.18  11 4.64  18   7/20 2.15 4.9 9.7 (6/20)17.4    18   9/20  2.02 4.3  21.4  55  11/20 2.17 4.7 11.2 6.17 23   1/21 2.44 4.9      12/21 2.09 5.1 11.6     6/22  (3/22)2.48 1.58 (3/22)4.7 4.2 (3/22)13.0 10.8     2/23 2.15<<1.87   1.12  43  8/23 2.08 5.1 9.4  15   9/24 2.15 4.7 12.5       Past Medical History:  Diagnosis Date   Acute myocardial infarction, subendocardial infarction, initial episode of care (HCC) 07/21/2012   Acute osteomyelitis involving ankle and foot (HCC) 02/06/2015   Acute systolic heart failure (HCC) 07/21/2012   New onset 07/19/12; admission to [facility] ED. Elevated Troponins.  S/p 2D-echo with EF 20-25%.  S/p cardiac catheterization with stenting LAD.  Repeat 2D-echo 10/2011 with improved EF of 35%.    Anemia    Arthritis    Automatic implantable cardioverter-defibrillator in situ    a. MDT CRT-D 06/2014, SN: [id number]  .removed in feb  2020   CAD (coronary artery disease)    a. cardiac cath 101/04/2012: PCI/DES to chronically occluded mLAD, consideration PCI to diag branch in 4 weeks.    Cataract    Chicken pox    Chronic kidney disease    Chronic systolic CHF (congestive heart failure) (HCC)    a. mixed ICM & NICM; b. EF 20-25% by echo 07/2012, mid-dist 2/3 of LV sev HK/AK, mild MR. echo 10/2012: EF 30-35%, sev HK ant-septal & inf walls, GR1DD, mild MR, PASP 33. c. echo 02/2013: EF 30%, GR1DD, mild MR. echo 04/2014: EF 30%, Septal-lat dyssynchrony, global HK, inf AK, GR1DD, mild MR. d. echo 10/2014: EF50-55%, WM nl, GR1DD, septal mild paradox. e. echo 02/2015: EF 50-55%, wm     Depression    Heart attack (HCC)    Heel ulcer (HCC) 04/27/2015   History of blood transfusion ~ 2011   "plasma; had neuropathy; couldn't walk"   Hypertension    Hypothyroidism    LBBB (left bundle branch block)    Neuromuscular disorder (HCC)    Neuropathy 2011   Obesity, unspecified    OSA on CPAP    Moderate with AHI 23/hr and now on CPAP at 16cm H2O.  Her DME is [facility]   Pure hypercholesterolemia    Sepsis (HCC) 09/2018   Type II diabetes mellitus (HCC)    Unspecified vitamin D deficiency    Past Surgical History:  Procedure Laterality Date   ABDOMINAL AORTOGRAM W/LOWER EXTREMITY N/A 02/02/2021   Procedure: ABDOMINAL AORTOGRAM W/LOWER EXTREMITY;  Surgeon: Chuck Hint, MD;  Location: [facility] INVASIVE CV LAB;  Service: Cardiovascular;  Laterality: N/A;   AMPUTATION Left 03/02/2021   Procedure: AMPUTATION RAY, left great toe;  Surgeon: Vivi Barrack, DPM;  Location: [facility] OR;  Service: Podiatry;  Laterality: Left;   AMPUTATION TOE Right 06/18/2015   Procedure: AMPUTATION TOE;  Surgeon: Gwyneth Revels, DPM;  Location: [facility] ORS;  Service: Podiatry;  Laterality: Right;   AMPUTATION TOE Left 10/08/2018   Procedure: AMPUTATION TOE LEFT 2ND;  Surgeon: Recardo Evangelist, DPM;  Location: [facility] ORS;  Service: Podiatry;  Laterality: Left;   BACK SURGERY     BI-VENTRICULAR IMPLANTABLE CARDIOVERTER DEFIBRILLATOR N/A 07/06/2014   Procedure: BI-VENTRICULAR IMPLANTABLE CARDIOVERTER DEFIBRILLATOR  (CRT-D);  Surgeon: Duke Salvia, MD;  Location[facility] CATH LAB;  Service: Cardiovascular;  Laterality: N/A;   BI-VENTRICULAR IMPLANTABLE CARDIOVERTER DEFIBRILLATOR  (CRT-D)  07/06/2014   BILATERAL OOPHORECTOMY  01/2011   ovarian cyst benign   BIV ICD INSERTION CRT-D N/A 03/31/2019   Procedure: BIV ICD INSERTION CRT-D;  Surgeon: Regan Lemming, MD;  Location: [facility] INVASIVE CV LAB;  Service: Cardiovascular;  Laterality: N/A;   COLONOSCOPY WITH PROPOFOL Left 02/22/2015   Procedure: COLONOSCOPY WITH PROPOFOL;   Surgeon: Wallace Cullens, MD;  Location: [facility] ENDOSCOPY;  Service: Endoscopy;  Laterality: Left;   CORONARY ANGIOPLASTY WITH STENT PLACEMENT Left 07/2012   new onset systolic CHF; elevated troponins.  Cardiac catheterization with stenting to LAD; EF 15%.  2D-echo: EF 20-25%.   ESOPHAGOGASTRODUODENOSCOPY N/A 02/22/2015   Procedure: ESOPHAGOGASTRODUODENOSCOPY (EGD);  Surgeon: Wallace Cullens, MD;  Location: [facility] ENDOSCOPY;  Service: Endoscopy;  Laterality: N/A;   I & D EXTREMITY Left 11/28/2020   Procedure: IRRIGATION AND DEBRIDEMENT OF FOOT;  Surgeon: Vivi Barrack, DPM;  Location: [facility] OR;  Service: Podiatry;  Laterality: Left;   I & D EXTREMITY Left 01/31/2021   Procedure: IRRIGATION AND DEBRIDEMENT EXTREMITY OF LEFT FOOT WITH BONE BIOPSY OF  1ST  METATARSAL AND FIFTH METATARSAL;  Surgeon: Candelaria Stagers, DPM;  Location: [facility] OR;  Service: Podiatry;  Laterality: Left;   I & D EXTREMITY Left 02/03/2021   Procedure: IRRIGATION AND DEBRIDEMENT EXTREMITY WITH INTERNAL AMPUTATION FIRST AND FIFTH RAY;  Surgeon: Candelaria Stagers, DPM;  Location: [facility] OR;  Service: Podiatry;  Laterality: Left;   INCISION AND DRAINAGE ABSCESS Right 2007   groin; with ICU stay due to sepsis.   IR FLUORO GUIDE CV LINE RIGHT  03/06/2021   IR REMOVAL TUN CV CATH W/O FL  04/27/2021   IR US GUIDE VASC ACCESS RIGHT  03/06/2021   IRRIGATION AND DEBRIDEMENT FOOT Left 04/25/2021   Procedure: EXCISION AND DEBRIDEMENT WOUND ULCER LEFT FOOT; METATARSECTOMY;  Surgeon: Vivi Barrack, DPM;  Location: [facility] ORS;  Service: Podiatry;  Laterality: Left;   LAPAROSCOPIC CHOLECYSTECTOMY  2011   LEFT HEART CATH AND CORONARY ANGIOGRAPHY N/A 10/05/2018   Procedure: LEFT HEART CATH AND CORONARY ANGIOGRAPHY;  Surgeon: Antonieta Iba, MD;  Location: [facility] INVASIVE CV LAB;  Service: Cardiovascular;  Laterality: N/A;   LEFT HEART CATHETERIZATION WITH CORONARY ANGIOGRAM N/A 07/21/2012   Procedure: LEFT HEART CATHETERIZATION WITH CORONARY ANGIOGRAM;  Surgeon: Dolores Patty, MD;  Location: [facility] CATH LAB;  Service: Cardiovascular;  Laterality: N/A;   LUMBAR DISC SURGERY  1996   L4-5   PACEMAKER REMOVAL  11/2018   due to infection around pacemaker   PERCUTANEOUS CORONARY STENT INTERVENTION (PCI-S) N/A 07/23/2012   Procedure: PERCUTANEOUS CORONARY STENT INTERVENTION (PCI-S);  Surgeon: Tonny Bollman, MD;  Location: [facility] CATH LAB;  Service: Cardiovascular;  Laterality: N/A;   PERIPHERAL VASCULAR BALLOON ANGIOPLASTY Left 10/06/2018   Procedure: PERIPHERAL VASCULAR BALLOON ANGIOPLASTY;  Surgeon: Renford Dills, MD;  Location: [facility] INVASIVE CV LAB;  Service: Cardiovascular;  Laterality: Left;   PERIPHERAL VASCULAR CATHETERIZATION N/A 02/10/2015   Procedure: Picc Line Insertion;  Surgeon: Renford Dills, MD;  Location: [facility] INVASIVE CV LAB;  Service: Cardiovascular;  Laterality: N/A;   RIGHT HEART CATH N/A 03/29/2019   Procedure: RIGHT HEART CATH;  Surgeon: Dolores Patty, MD;  Location: [facility] INVASIVE CV LAB;  Service: Cardiovascular;  Laterality: N/A;   TEE WITHOUT CARDIOVERSION N/A 10/02/2018   Procedure: TRANSESOPHAGEAL ECHOCARDIOGRAM (TEE);  Surgeon: Iran Ouch, MD;  Location: [facility] ORS;  Service: Cardiovascular;  Laterality: N/A;   TOTAL KNEE ARTHROPLASTY Right 05/07/2022   Procedure: TOTAL KNEE ARTHROPLASTY;  Surgeon: Durene Romans, MD;  Location: [facility] ORS;  Service: Orthopedics;  Laterality: Right;   TUBAL LIGATION  1981   VAGINAL HYSTERECTOMY  01/2011   Fibroids/DUB.  Ovaries removed. [name].   Current Meds  Medication Sig   acetaminophen (TYLENOL) 500 MG tablet Take 2 tablets (1,000 mg total) by mouth every 6 (six) hours as needed for moderate pain or headache.   amiodarone (PACERONE) 200 MG tablet TAKE ONE TABLET BY MOUTH ONCE DAILY (Patient taking differently: 1/2 tab every other day)   atorvastatin (LIPITOR) 10 MG tablet TAKE ONE TABLET BY MOUTH ONCE DAILY. NEED OFFICE VISIT FOR MORE REFILLS   carvedilol (COREG) 6.25 MG tablet Take 1  tablet (6.25 mg total) by mouth 2 (two) times daily. (Patient taking differently: Take 6.25 mg by mouth daily.)   clopidogrel (PLAVIX) 75 MG tablet TAKE 1 TABLET BY MOUTH ONCE A DAY   Continuous Blood Gluc Receiver (FREESTYLE LIBRE 2 READER) DEVI Use to check sugar multiple times per day as directed.  H/o DM2, treated with insulin.   Continuous Blood Gluc Receiver (  FREESTYLE LIBRE 2 READER) DEVI FreeStyle Libre 2 Reader   Continuous Glucose Sensor (FREESTYLE LIBRE 2 SENSOR) MISC USE TO CHECK BLOOD SUGARS MULTIPLE TIMES DAILY AS DIRECTED DX:E11.9   dapagliflozin propanediol (FARXIGA) 10 MG TABS tablet Take by mouth.   DULoxetine (CYMBALTA) 30 MG capsule TAKE ONE CAPSULE BY MOUTH EVERY NIGHT AT BEDTIME   gabapentin (NEURONTIN) 600 MG tablet Take 600 mg by mouth at bedtime.   insulin aspart (NOVOLOG FLEXPEN) 100 UNIT/ML FlexPen INJECT 14 UNITS WITH BREAKFAST AND 5-8 UNITS WITH DINNER   insulin glargine (LANTUS SOLOSTAR) 100 UNIT/ML Solostar Pen 8 units daily in PM   Insulin Pen Needle (PEN NEEDLES) 32G X 6 MM MISC 1 each by Does not apply route 4 (four) times daily.   levothyroxine (SYNTHROID) 50 MCG tablet TAKE ONE TABLET BY MOUTH ONCE DAILY BEFORE BREAKFAST   Multiple Vitamin (MULTIVITAMIN WITH MINERALS) TABS tablet Take 1 tablet by mouth every morning.   nitroGLYCERIN (NITROSTAT) 0.4 MG SL tablet Place 1 tablet (0.4 mg total) under the tongue every 5 (five) minutes as needed for chest pain.   Omega-3 Fatty Acids (FISH OIL) 1000 MG CAPS Take 1,000 mg by mouth 2 (two) times a day.   ONETOUCH ULTRA test strip USE AS INSTRUCTED TO CHECK BLOOD SUGAR 3TIMES DAILY   polyethylene glycol (MIRALAX / GLYCOLAX) 17 g packet Take 17 g by mouth daily as needed for mild constipation.   Semaglutide,0.25 or 0.5MG /DOS, (OZEMPIC, 0.25 OR 0.5 MG/DOSE,) 2 MG/3ML SOPN Inject 0.5 mg as directed per week   torsemide (DEMADEX) 20 MG tablet TAKE ONE TABLET BY MOUTH TWICE A DAY   traMADol (ULTRAM) 50 MG tablet TAKE ONE (1)  TO TWO (2) TABLETS (50-100 MG TOTAL) BY MOUTH AT BEDTIME AS NEEDED.   [DISCONTINUED] Semaglutide,0.25 or 0.5MG /DOS, (OZEMPIC, 0.25 OR 0.5 MG/DOSE,) 2 MG/3ML SOPN Inject 0.5 mg weekly   Allergies  Allergen Reactions   Heparin Other (See Comments)    Can not remember reaction   Nsaids Other (See Comments)    Kidney disease   Review of Systems negative except from HPI and PMH  Physical Exam: BP (!) 98/58 (BP Location: Left Arm, Patient Position: Sitting, Cuff Size: Normal)   Pulse 74   Ht 5\' 8"  (1.727 m)   Wt 253 lb (114.8 kg)   SpO2 98%   BMI 38.47 kg/m  Well developed and Morbidly obese  in no acute distress HENT normal Neck supple with JVP-flat Clear Device pocket well healed; without hematoma or erythema.  There is no tethering  Regular rate and rhythm, no  gallop No murmur Abd-soft with active BS No Clubbing cyanosis tr edema Skin-warm and dry A & Oriented  Grossly normal sensory and motor function  ECG sinus P-synchronous/ AV  pacing  Negative QRS lead I positive QRS lead V1 rare PVC Vector express withdrawn, the only vector in which we did not have diaphragmatic stimulation was 2--coil.  Programmed in this way.  Device function is normal. Programming changes (As above)   See Paceart for details      Review of the tracing from 11/21 showed a QR in lead I and RS in lead V1  Assessment and  Plan  CRT-D recent reimplantation   Nonischemic cardiomyopathy  Coronary artery disease  Peripheral vascular disease  Orthostatic hypotension  Congestive heart failure- chronic-systolic class II  Renal insufficiency grade 3   Hypothyroidism  High Risk Medication Surveillance-Amiodarone   PVCs  Recent amputation lower extremity digits  Diaphragmatic stimulation with  her CRT-D abrupt onset 6/24; denies trauma or falls.  Extensive reprogramming as noted above were able to pass around it.  With shortness of breath, we will check her echocardiogram.  Also on  amiodarone we will check a chest x-ray looking both for evidence of interstitial lung disease; may need CT scan) as well as evidence of lead dislodgment although the dislodgment must be microscopic as electrical resynchronization looks good and almost all configurations.  Orthostatic hypotension is a problem.  Her medications have already been down titrated.  Rather than trying medications to improve blood pressure, we discussed nonpharmacological therapies including abdominal binders and thigh sleeves.  We will also change her carvedilol from 6.25 daily to 3.125 twice daily.  We may need to discontinue it  Continue amiodarone.  Will check surveillance laboratories today.  PVCs are relatively quiescent.  Volume status is stable.  Will continue her off diuretics  I spent today reviewing her records including previous charts and labs, taking her history and face-to-face counseling,  adjusting medications , and documentation

## 2023-07-29 NOTE — Patient Instructions (Addendum)
Medication Instructions:   Decrease Carvedilol to 3.125 mg twice daily.   *If you need a refill on your cardiac medications before your next appointment, please call your pharmacy*  Labs: Your provider would like for you to have following labs drawn today TSH, LFT, CMET.    Testing/Procedures:  Your physician has requested that you have an echocardiogram. Echocardiography is a painless test that uses sound waves to create images of your heart. It provides your doctor with information about the size and shape of your heart and how well your heart's chambers and valves are working.   You may receive an ultrasound enhancing agent through an IV if needed to better visualize your heart during the echo. This procedure takes approximately one hour.  There are no restrictions for this procedure.  This will take place at [address] [address], [place] [address]   A chest x-ray takes a picture of the organs and structures inside the chest, including the heart, lungs, and blood vessels. This test can show several things, including, whether the heart is enlarges; whether fluid is building up in the lungs; and whether pacemaker / defibrillator leads are still in place. - [facility] - or Outpatient Imaging off [address] [facility]) to have this done- no appointment needed.  M-F 8am-5pm.    Follow-Up: At [facility], you and your health needs are our priority.  As part of our continuing mission to provide you with exceptional heart care, we have created designated Provider Care Teams.  These Care Teams include your primary Cardiologist (physician) and Advanced Practice Providers (APPs -  Physician Assistants and Nurse Practitioners) who all work together to provide you with the care you need, when you need it.  We recommend signing up for the patient portal called "MyChart".  Sign up information is provided on this After Visit Summary.  MyChart is used to connect with  patients for Virtual Visits (Telemedicine).  Patients are able to view lab/test results, encounter notes, upcoming appointments, etc.  Non-urgent messages can be sent to your provider as well.   To learn more about what you can do with MyChart, go to [email or web address].    Your next appointment:   6 month(s)  Provider:   Sherryl Manges, MD

## 2023-07-30 ENCOUNTER — Other Ambulatory Visit: Payer: Self-pay

## 2023-07-30 DIAGNOSIS — E870 Hyperosmolality and hypernatremia: Secondary | ICD-10-CM

## 2023-07-30 LAB — COMPREHENSIVE METABOLIC PANEL
ALT: 15 [IU]/L (ref 0–32)
AST: 24 [IU]/L (ref 0–40)
Albumin: 4.1 g/dL (ref 3.9–4.9)
Alkaline Phosphatase: 125 [IU]/L — ABNORMAL HIGH (ref 44–121)
BUN/Creatinine Ratio: 19 (ref 12–28)
BUN: 34 mg/dL — ABNORMAL HIGH (ref 8–27)
Bilirubin Total: 0.3 mg/dL (ref 0.0–1.2)
CO2: 24 mmol/L (ref 20–29)
Calcium: 9.4 mg/dL (ref 8.7–10.3)
Chloride: 104 mmol/L (ref 96–106)
Creatinine, Ser: 1.83 mg/dL — ABNORMAL HIGH (ref 0.57–1.00)
Globulin, Total: 2.6 g/dL (ref 1.5–4.5)
Glucose: 75 mg/dL (ref 70–99)
Potassium: 4.8 mmol/L (ref 3.5–5.2)
Sodium: 146 mmol/L — ABNORMAL HIGH (ref 134–144)
Total Protein: 6.7 g/dL (ref 6.0–8.5)
eGFR: 30 mL/min/{1.73_m2} — ABNORMAL LOW (ref >59)

## 2023-07-30 LAB — HEPATIC FUNCTION PANEL: Bilirubin, Direct: 0.11 mg/dL (ref 0.00–0.40)

## 2023-07-30 LAB — TSH: TSH: 0.36 u[IU]/mL — ABNORMAL LOW (ref 0.450–4.500)

## 2023-07-30 MED ORDER — LEVOTHYROXINE SODIUM 25 MCG PO TABS: 37.0000 ug | ORAL_TABLET | Freq: Every day | ORAL | 3 refills | Status: DC

## 2023-07-31 LAB — HM DIABETES EYE EXAM

## 2023-08-03 LAB — CUP PACEART INCLINIC DEVICE CHECK
Date Time Interrogation Session: 20241022164830
Implantable Lead Connection Status: 753985
Implantable Lead Connection Status: 753985
Implantable Lead Connection Status: 753985
Implantable Lead Implant Date: 20200625
Implantable Lead Implant Date: 20200625
Implantable Lead Implant Date: 20200625
Implantable Lead Location: 753858
Implantable Lead Location: 753859
Implantable Lead Location: 753860
Implantable Lead Model: 4398
Implantable Lead Model: 5076
Implantable Lead Model: 6935
Implantable Pulse Generator Implant Date: 20200625

## 2023-08-04 ENCOUNTER — Ambulatory Visit: Payer: PPO

## 2023-08-04 DIAGNOSIS — I5022 Chronic systolic (congestive) heart failure: Secondary | ICD-10-CM | POA: Diagnosis not present

## 2023-08-06 LAB — CUP PACEART REMOTE DEVICE CHECK
Battery Remaining Longevity: 25 mo
Battery Voltage: 2.94 V
Brady Statistic AP VP Percent: 2.07 %
Brady Statistic AP VS Percent: 0.01 %
Brady Statistic AS VP Percent: 97.8 %
Brady Statistic AS VS Percent: 0.11 %
Brady Statistic RA Percent Paced: 2.08 %
Brady Statistic RV Percent Paced: 99.44 %
Date Time Interrogation Session: 20241028012503
HighPow Impedance: 65 Ohm
Implantable Lead Connection Status: 753985
Implantable Lead Connection Status: 753985
Implantable Lead Connection Status: 753985
Implantable Lead Implant Date: 20200625
Implantable Lead Implant Date: 20200625
Implantable Lead Implant Date: 20200625
Implantable Lead Location: 753858
Implantable Lead Location: 753859
Implantable Lead Location: 753860
Implantable Lead Model: 4398
Implantable Lead Model: 5076
Implantable Lead Model: 6935
Implantable Pulse Generator Implant Date: 20200625
Lead Channel Impedance Value: 1121 Ohm
Lead Channel Impedance Value: 1140 Ohm
Lead Channel Impedance Value: 195.911
Lead Channel Impedance Value: 195.911
Lead Channel Impedance Value: 208.627
Lead Channel Impedance Value: 301.328
Lead Channel Impedance Value: 301.328
Lead Channel Impedance Value: 304 Ohm
Lead Channel Impedance Value: 361 Ohm
Lead Channel Impedance Value: 399 Ohm
Lead Channel Impedance Value: 456 Ohm
Lead Channel Impedance Value: 551 Ohm
Lead Channel Impedance Value: 551 Ohm
Lead Channel Impedance Value: 665 Ohm
Lead Channel Impedance Value: 722 Ohm
Lead Channel Impedance Value: 779 Ohm
Lead Channel Impedance Value: 779 Ohm
Lead Channel Impedance Value: 931 Ohm
Lead Channel Pacing Threshold Amplitude: 0.625 V
Lead Channel Pacing Threshold Amplitude: 0.625 V
Lead Channel Pacing Threshold Amplitude: 3 V
Lead Channel Pacing Threshold Pulse Width: 0.4 ms
Lead Channel Pacing Threshold Pulse Width: 0.4 ms
Lead Channel Pacing Threshold Pulse Width: 1 ms
Lead Channel Sensing Intrinsic Amplitude: 0.5 mV
Lead Channel Sensing Intrinsic Amplitude: 0.5 mV
Lead Channel Sensing Intrinsic Amplitude: 20.625 mV
Lead Channel Sensing Intrinsic Amplitude: 20.625 mV
Lead Channel Setting Pacing Amplitude: 1.75 V
Lead Channel Setting Pacing Amplitude: 2 V
Lead Channel Setting Pacing Amplitude: 3 V
Lead Channel Setting Pacing Pulse Width: 0.4 ms
Lead Channel Setting Pacing Pulse Width: 1 ms
Lead Channel Setting Sensing Sensitivity: 0.3 mV
Zone Setting Status: 755011
Zone Setting Status: 755011

## 2023-08-08 ENCOUNTER — Telehealth: Payer: Self-pay

## 2023-08-08 NOTE — Telephone Encounter (Signed)
Spoke w/ SK. Will maintain course as long as stim is tolerable. Relayed to patient

## 2023-08-08 NOTE — Telephone Encounter (Signed)
Called pt to f/u if LV stim has returned since reprogramming. It has. I will review w/ SK.

## 2023-08-11 ENCOUNTER — Other Ambulatory Visit: Payer: Self-pay | Admitting: Family Medicine

## 2023-08-14 ENCOUNTER — Ambulatory Visit: Payer: PPO | Attending: Internal Medicine

## 2023-08-14 DIAGNOSIS — I493 Ventricular premature depolarization: Secondary | ICD-10-CM

## 2023-08-14 DIAGNOSIS — R0602 Shortness of breath: Secondary | ICD-10-CM

## 2023-08-14 MED ORDER — PERFLUTREN LIPID MICROSPHERE
1.0000 mL | INTRAVENOUS | Status: AC | PRN
  Administered 2023-08-14: 4 mL via INTRAVENOUS

## 2023-08-15 LAB — ECHOCARDIOGRAM COMPLETE: Area-P 1/2: 2.87 cm2

## 2023-08-18 ENCOUNTER — Other Ambulatory Visit: Payer: Self-pay | Admitting: Internal Medicine

## 2023-08-20 ENCOUNTER — Ambulatory Visit: Payer: PPO | Attending: Cardiology | Admitting: Cardiology

## 2023-08-20 ENCOUNTER — Encounter: Payer: Self-pay | Admitting: Cardiology

## 2023-08-20 VITALS — BP 136/66 | HR 67 | Ht 68.0 in | Wt 251.0 lb

## 2023-08-20 DIAGNOSIS — N184 Chronic kidney disease, stage 4 (severe): Secondary | ICD-10-CM

## 2023-08-20 DIAGNOSIS — Z95810 Presence of automatic (implantable) cardiac defibrillator: Secondary | ICD-10-CM | POA: Diagnosis not present

## 2023-08-20 DIAGNOSIS — I493 Ventricular premature depolarization: Secondary | ICD-10-CM | POA: Diagnosis not present

## 2023-08-20 DIAGNOSIS — Z79899 Other long term (current) drug therapy: Secondary | ICD-10-CM | POA: Diagnosis not present

## 2023-08-20 DIAGNOSIS — I5022 Chronic systolic (congestive) heart failure: Secondary | ICD-10-CM

## 2023-08-20 DIAGNOSIS — I2510 Atherosclerotic heart disease of native coronary artery without angina pectoris: Secondary | ICD-10-CM | POA: Diagnosis not present

## 2023-08-20 LAB — CUP PACEART INCLINIC DEVICE CHECK
Date Time Interrogation Session: 20241113152740
Implantable Lead Connection Status: 753985
Implantable Lead Connection Status: 753985
Implantable Lead Connection Status: 753985
Implantable Lead Implant Date: 20200625
Implantable Lead Implant Date: 20200625
Implantable Lead Implant Date: 20200625
Implantable Lead Location: 753858
Implantable Lead Location: 753859
Implantable Lead Location: 753860
Implantable Lead Model: 4398
Implantable Lead Model: 5076
Implantable Lead Model: 6935
Implantable Pulse Generator Implant Date: 20200625

## 2023-08-20 MED ORDER — AMIODARONE HCL 200 MG PO TABS: 100.0000 mg | ORAL_TABLET | ORAL | 1 refills | Status: DC

## 2023-08-20 NOTE — Patient Instructions (Signed)
Medication Instructions:  The current medical regimen is effective;  continue present plan and medications.  *If you need a refill on your cardiac medications before your next appointment, please call your pharmacy*   Follow-Up: At [facility], you and your health needs are our priority.  As part of our continuing mission to provide you with exceptional heart care, we have created designated Provider Care Teams.  These Care Teams include your primary Cardiologist (physician) and Advanced Practice Providers (APPs -  Physician Assistants and Nurse Practitioners) who all work together to provide you with the care you need, when you need it.  We recommend signing up for the patient portal called "MyChart".  Sign up information is provided on this After Visit Summary.  MyChart is used to connect with patients for Virtual Visits (Telemedicine).  Patients are able to view lab/test results, encounter notes, upcoming appointments, etc.  Non-urgent messages can be sent to your provider as well.   To learn more about what you can do with MyChart, go to [email or web address].    Your next appointment:   6 month(s)  Provider:   Sherryl Manges, MD or Sherie Don, NP

## 2023-08-20 NOTE — Progress Notes (Signed)
Cardiology Office Note Date:  08/20/2023  Patient ID:  [name], DOB 10-12-1955, MRN [id number] PCP:  Joaquim Nam, MD  Cardiologist:  Julien Nordmann, MD HF Cardiologist: Nicholes Mango, MD Electrophysiologist: Sherryl Manges, MD    Chief Complaint: echo follow-up, diaphragmatic stim  History of Present Illness: [name] is a 67 y.o. female with PMH notable for HFimpEF d/t mixed NICM/ICM, ATach, PVCs, CAD s/p PCI, PVD, CKD-3; seen today for Sherryl Manges, MD for acute visit due to recent echo results.   She had CRT system extracted 12/2018 for MSSA bacteremia, then had worsening of LV function, so re-implanted CRT-D 03/2019. She has had improvement in LVEF since.  Her atach and PVCs have been managed with low-dose amiodarone.  She last saw Dr. Graciela Husbands 07/2023, was having significant LH prompted several medication changes. Also having worsening SOB. Diaphragmatic stim present and programmed around. He recommended updating TTE to further eval, and given long-term amio use a CXR to eval for ILD.  Her TTE showed severe apical hypokinesis with preserved LVEF  On follow-up today, she continues to have fatigue with activity and DOE. Denies chest pain, pressure or palpitations. She thinks her fluid status is stable and not hanging on to extra fluid.  She has also noticed diaphragmatic stim with leaning over on right or left side. It is improved from Dr. Odessa Fleming previous device adjustments, but still present.    Device Information: MDT CRT-D, imp 03/2019; dx HFrEF, LBBB  Only vector w/o stim was 2 > coil   AAD History: Amiodarone, low dose   Past Medical History:  Diagnosis Date   Acute myocardial infarction, subendocardial infarction, initial episode of care (HCC) 07/21/2012   Acute osteomyelitis involving ankle and foot (HCC) 02/06/2015   Acute systolic heart failure (HCC) 07/21/2012   New onset 07/19/12; admission to [facility] ED. Elevated Troponins.  S/p 2D-echo with EF  20-25%.  S/p cardiac catheterization with stenting LAD.  Repeat 2D-echo 10/2011 with improved EF of 35%.    Anemia    Arthritis    Automatic implantable cardioverter-defibrillator in situ    a. MDT CRT-D 06/2014, SN: [id number]  .removed in feb 2020   CAD (coronary artery disease)    a. cardiac cath 101/04/2012: PCI/DES to chronically occluded mLAD, consideration PCI to diag branch in 4 weeks.    Cataract    Chicken pox    Chronic kidney disease    Chronic systolic CHF (congestive heart failure) (HCC)    a. mixed ICM & NICM; b. EF 20-25% by echo 07/2012, mid-dist 2/3 of LV sev HK/AK, mild MR. echo 10/2012: EF 30-35%, sev HK ant-septal & inf walls, GR1DD, mild MR, PASP 33. c. echo 02/2013: EF 30%, GR1DD, mild MR. echo 04/2014: EF 30%, Septal-lat dyssynchrony, global HK, inf AK, GR1DD, mild MR. d. echo 10/2014: EF50-55%, WM nl, GR1DD, septal mild paradox. e. echo 02/2015: EF 50-55%, wm    Depression    Heart attack (HCC)    Heel ulcer (HCC) 04/27/2015   History of blood transfusion ~ 2011   "plasma; had neuropathy; couldn't walk"   Hypertension    Hypothyroidism    LBBB (left bundle branch block)    Neuromuscular disorder (HCC)    Neuropathy 2011   Obesity, unspecified    OSA on CPAP    Moderate with AHI 23/hr and now on CPAP at 16cm H2O.  Her DME is [facility]   Pure hypercholesterolemia    Sepsis (HCC) 09/2018   Type II  diabetes mellitus (HCC)    Unspecified vitamin D deficiency     Past Surgical History:  Procedure Laterality Date   ABDOMINAL AORTOGRAM W/LOWER EXTREMITY N/A 02/02/2021   Procedure: ABDOMINAL AORTOGRAM W/LOWER EXTREMITY;  Surgeon: Chuck Hint, MD;  Location: [facility] INVASIVE CV LAB;  Service: Cardiovascular;  Laterality: N/A;   AMPUTATION Left 03/02/2021   Procedure: AMPUTATION RAY, left great toe;  Surgeon: Vivi Barrack, DPM;  Location: [facility] OR;  Service: Podiatry;  Laterality: Left;   AMPUTATION TOE Right 06/18/2015   Procedure: AMPUTATION TOE;  Surgeon: Gwyneth Revels,  DPM;  Location: [facility] ORS;  Service: Podiatry;  Laterality: Right;   AMPUTATION TOE Left 10/08/2018   Procedure: AMPUTATION TOE LEFT 2ND;  Surgeon: Recardo Evangelist, DPM;  Location: [facility] ORS;  Service: Podiatry;  Laterality: Left;   BACK SURGERY     BI-VENTRICULAR IMPLANTABLE CARDIOVERTER DEFIBRILLATOR N/A 07/06/2014   Procedure: BI-VENTRICULAR IMPLANTABLE CARDIOVERTER DEFIBRILLATOR  (CRT-D);  Surgeon: Duke Salvia, MD;  Location: [facility] CATH LAB;  Service: Cardiovascular;  Laterality: N/A;   BI-VENTRICULAR IMPLANTABLE CARDIOVERTER DEFIBRILLATOR  (CRT-D)  07/06/2014   BILATERAL OOPHORECTOMY  01/2011   ovarian cyst benign   BIV ICD INSERTION CRT-D N/A 03/31/2019   Procedure: BIV ICD INSERTION CRT-D;  Surgeon: Regan Lemming, MD;  Location: [facility] INVASIVE CV LAB;  Service: Cardiovascular;  Laterality: N/A;   COLONOSCOPY WITH PROPOFOL Left 02/22/2015   Procedure: COLONOSCOPY WITH PROPOFOL;  Surgeon: Wallace Cullens, MD;  Location: [facility] ENDOSCOPY;  Service: Endoscopy;  Laterality: Left;   CORONARY ANGIOPLASTY WITH STENT PLACEMENT Left 07/2012   new onset systolic CHF; elevated troponins.  Cardiac catheterization with stenting to LAD; EF 15%.  2D-echo: EF 20-25%.   ESOPHAGOGASTRODUODENOSCOPY N/A 02/22/2015   Procedure: ESOPHAGOGASTRODUODENOSCOPY (EGD);  Surgeon: Wallace Cullens, MD;  Location: [facility] ENDOSCOPY;  Service: Endoscopy;  Laterality: N/A;   I & D EXTREMITY Left 11/28/2020   Procedure: IRRIGATION AND DEBRIDEMENT OF FOOT;  Surgeon: Vivi Barrack, DPM;  Location: [facility] OR;  Service: Podiatry;  Laterality: Left;   I & D EXTREMITY Left 01/31/2021   Procedure: IRRIGATION AND DEBRIDEMENT EXTREMITY OF LEFT FOOT WITH BONE BIOPSY OF  1ST METATARSAL AND FIFTH METATARSAL;  Surgeon: Candelaria Stagers, DPM;  Location: [facility] OR;  Service: Podiatry;  Laterality: Left;   I & D EXTREMITY Left 02/03/2021   Procedure: IRRIGATION AND DEBRIDEMENT EXTREMITY WITH INTERNAL AMPUTATION FIRST AND FIFTH RAY;  Surgeon: Candelaria Stagers, DPM;   Location: [facility] OR;  Service: Podiatry;  Laterality: Left;   INCISION AND DRAINAGE ABSCESS Right 2007   groin; with ICU stay due to sepsis.   IR FLUORO GUIDE CV LINE RIGHT  03/06/2021   IR REMOVAL TUN CV CATH W/O FL  04/27/2021   IR US GUIDE VASC ACCESS RIGHT  03/06/2021   IRRIGATION AND DEBRIDEMENT FOOT Left 04/25/2021   Procedure: EXCISION AND DEBRIDEMENT WOUND ULCER LEFT FOOT; METATARSECTOMY;  Surgeon: Vivi Barrack, DPM;  Location: [facility] ORS;  Service: Podiatry;  Laterality: Left;   LAPAROSCOPIC CHOLECYSTECTOMY  2011   LEFT HEART CATH AND CORONARY ANGIOGRAPHY N/A 10/05/2018   Procedure: LEFT HEART CATH AND CORONARY ANGIOGRAPHY;  Surgeon: Antonieta Iba, MD;  Location: [facility] INVASIVE CV LAB;  Service: Cardiovascular;  Laterality: N/A;   LEFT HEART CATHETERIZATION WITH CORONARY ANGIOGRAM N/A 07/21/2012   Procedure: LEFT HEART CATHETERIZATION WITH CORONARY ANGIOGRAM;  Surgeon: Dolores Patty, MD;  Location: [facility] CATH LAB;  Service: Cardiovascular;  Laterality: N/A;   LUMBAR DISC SURGERY  1996   L4-5   PACEMAKER REMOVAL  11/2018   due to infection around pacemaker   PERCUTANEOUS CORONARY STENT INTERVENTION (PCI-S) N/A 07/23/2012   Procedure: PERCUTANEOUS CORONARY STENT INTERVENTION (PCI-S);  Surgeon: Tonny Bollman, MD;  Location: [facility] CATH LAB;  Service: Cardiovascular;  Laterality: N/A;   PERIPHERAL VASCULAR BALLOON ANGIOPLASTY Left 10/06/2018   Procedure: PERIPHERAL VASCULAR BALLOON ANGIOPLASTY;  Surgeon: Renford Dills, MD;  Location: [facility] INVASIVE CV LAB;  Service: Cardiovascular;  Laterality: Left;   PERIPHERAL VASCULAR CATHETERIZATION N/A 02/10/2015   Procedure: Picc Line Insertion;  Surgeon: Renford Dills, MD;  Location: [facility] INVASIVE CV LAB;  Service: Cardiovascular;  Laterality: N/A;   RIGHT HEART CATH N/A 03/29/2019   Procedure: RIGHT HEART CATH;  Surgeon: Dolores Patty, MD;  Location: [facility] INVASIVE CV LAB;  Service: Cardiovascular;  Laterality: N/A;   TEE WITHOUT  CARDIOVERSION N/A 10/02/2018   Procedure: TRANSESOPHAGEAL ECHOCARDIOGRAM (TEE);  Surgeon: Iran Ouch, MD;  Location: [facility] ORS;  Service: Cardiovascular;  Laterality: N/A;   TOTAL KNEE ARTHROPLASTY Right 05/07/2022   Procedure: TOTAL KNEE ARTHROPLASTY;  Surgeon: Durene Romans, MD;  Location: [facility] ORS;  Service: Orthopedics;  Laterality: Right;   TUBAL LIGATION  1981   VAGINAL HYSTERECTOMY  01/2011   Fibroids/DUB.  Ovaries removed. [place].    Current Outpatient Medications  Medication Instructions   acetaminophen (TYLENOL) 1,000 mg, Oral, Every 6 hours PRN   amiodarone (PACERONE) 100 mg, Oral, Every other day   atorvastatin (LIPITOR) 10 MG tablet TAKE ONE TABLET BY MOUTH ONCE DAILY. NEED OFFICE VISIT FOR MORE REFILLS   carvedilol (COREG) 3.125 mg, Oral, 2 times daily   clopidogrel (PLAVIX) 75 mg, Oral, Daily   Continuous Blood Gluc Receiver (FREESTYLE LIBRE 2 READER) DEVI Use to check sugar multiple times per day as directed.  H/o DM2, treated with insulin.   Continuous Blood Gluc Receiver (FREESTYLE LIBRE 2 READER) DEVI FreeStyle Libre 2 Reader   Continuous Glucose Sensor (FREESTYLE LIBRE 2 SENSOR) MISC USE TO CHECK BLOOD SUGARS MULTIPLE TIMES DAILY AS DIRECTED DX:E11.9   dapagliflozin propanediol (FARXIGA) 10 MG TABS tablet Oral   DULoxetine (CYMBALTA) 30 mg, Oral, Daily at bedtime   Fish Oil 1,000 mg, Oral, 2 times daily   gabapentin (NEURONTIN) 600 mg, Oral, Daily at bedtime   insulin aspart (NOVOLOG FLEXPEN) 100 UNIT/ML FlexPen INJECT 14 UNITS WITH BREAKFAST AND 5-8 UNITS WITH DINNER   insulin glargine (LANTUS SOLOSTAR) 100 UNIT/ML Solostar Pen 8 units daily in PM   Insulin Pen Needle (PEN NEEDLES) 32G X 6 MM MISC 1 each, Does not apply, 4 times daily   levothyroxine (SYNTHROID) 37.5 mcg, Oral, Daily before breakfast   Multiple Vitamin (MULTIVITAMIN WITH MINERALS) TABS tablet 1 tablet, Oral, Every morning   nitroGLYCERIN (NITROSTAT) 0.4 mg, Sublingual, Every 5 min PRN   ONETOUCH  ULTRA test strip USE AS INSTRUCTED TO CHECK BLOOD SUGAR 3TIMES DAILY   polyethylene glycol (MIRALAX / GLYCOLAX) 17 g, Oral, Daily PRN   Semaglutide,0.25 or 0.5MG /DOS, (OZEMPIC, 0.25 OR 0.5 MG/DOSE,) 2 MG/3ML SOPN Inject 0.5 mg as directed per week   torsemide (DEMADEX) 20 mg, Oral, 2 times daily   traMADol (ULTRAM) 50 MG tablet TAKE ONE (1) TO TWO (2) TABLETS (50-100 MG TOTAL) BY MOUTH AT BEDTIME AS NEEDED.    Social History:  The patient  reports that she has never smoked. She has never used smokeless tobacco. She reports that she does not drink alcohol and does not use drugs.   Family  History:  The patient's family history includes Arthritis in her mother; COPD in her maternal grandmother; Cancer in her father and maternal grandfather; Cancer (age of onset: 55) in her brother; Diabetes in her maternal grandmother, mother, paternal grandfather, paternal grandmother, and son; Heart disease in her maternal grandmother; Hypertension in her mother and son; Obesity in her brother.  ROS:  Please see the history of present illness. All other systems are reviewed and otherwise negative.   PHYSICAL EXAM:  VS:  BP 136/66 (BP Location: Left Arm, Patient Position: Sitting, Cuff Size: Normal)   Pulse 67   Ht 5\' 8"  (1.727 m)   Wt 251 lb (113.9 kg)   SpO2 98%   BMI 38.16 kg/m  BMI: Body mass index is 38.16 kg/m.  GEN- The patient is well appearing, alert and oriented x 3 today.   Lungs- Clear to ausculation bilaterally, normal work of breathing.  Heart- Regular rate and rhythm, no murmurs, rubs or gallops Extremities- Trace peripheral edema, warm, dry Skin-  device pocket well-healed, no tethering   Device interrogation done today and reviewed by myself:  Battery 1.9 years Lead thresholds, impedence, sensing stable  Vector express utilized with no phrenic stim LV4 > 2  No episodes VP 99% No further changes made today  EKG is ordered. Personal review of EKG from today shows:    EKG  Interpretation Date/Time:  Wednesday August 20 2023 13:52:04 EST Ventricular Rate:  67 PR Interval:    QRS Duration:  184 QT Interval:  548 QTC Calculation: 579 R Axis:   259  Text Interpretation: Ventricular-paced rhythm Confirmed by Sherie Don [phone]) on 08/20/2023 3:12:46 PM    Recent Labs: 07/29/2023: ALT 15; BUN 34; Creatinine, Ser 1.83; Potassium 4.8; Sodium 146; TSH 0.360  11/18/2022: Cholesterol 135; HDL 34.50; LDL Cholesterol 62; Total CHOL/HDL Ratio 4; Triglycerides 193.0; VLDL 38.6   CrCl cannot be calculated (Patient's most recent lab result is older than the maximum 21 days allowed.).   Wt Readings from Last 3 Encounters:  08/20/23 251 lb (113.9 kg)  07/29/23 253 lb (114.8 kg)  03/17/23 263 lb (119.3 kg)     Additional studies reviewed include: Previous EP, cardiology notes.   TTE, 08/14/2023  1. Challenging images, definity used. Left ventricular ejection fraction, by estimation, is 55%   2. The left ventricle has normal function. 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 Left ventricular diastolic parameters are consistent with Grade I diastolic dysfunction (impaired relaxation).   3. Right ventricular systolic function is normal. The right ventricular size is normal. Tricuspid regurgitation signal is inadequate for assessing PA pressure.   4. The mitral valve is normal in structure. No evidence of mitral valve regurgitation. No evidence of mitral stenosis.   5. The aortic valve is normal in structure. Aortic valve regurgitation is not visualized. No aortic stenosis is present.   6. The inferior vena cava is normal in size with greater than 50% respiratory variability, suggesting right atrial pressure of 3 mmHg.   TTE, 11/26/2021  1. Endocardium is not well seen Difficult to evaluate regional wall motion. . Left ventricular ejection fraction, by estimation, is  50 to 55%. The left ventricle has low normal function. Left ventricular diastolic parameters are consistent with Grade I diastolic dysfunction (impaired relaxation). Elevated left atrial pressure.   2. Right ventricular systolic function is normal. The right ventricular size is normal.   3. The mitral valve is normal  in structure. Mild mitral valve regurgitation.   4. The aortic valve is tricuspid. Aortic valve regurgitation is not visualized.   Comparison(s): The left ventricular function is unchanged.   TTE limited, 03/07/2020  1. Left ventricular ejection fraction, by estimation, is 50 to 55%. The left ventricle has low normal function. The left ventricle has no regional wall motion abnormalities. Left ventricular diastolic parameters are consistent with Grade I diastolic dysfunction (impaired relaxation).   2. Right ventricular systolic function is low normal.   3. Mild to moderate mitral valve regurgitation.     ASSESSMENT AND PLAN:  #) CAD s/p PCI #) HFimpEF #) CKD stage 4 Previous PCI to LAD Updated echo with concerning area of severe hypokinesis Warm and euvolemic on exam Follow up with HF MD regarding whether to proceed with LHC in setting of CKD  #) PVCs #) Atach #) high risk medication monitoring - amiodarone Low PVC burden, no atach episodes Recent thyroid labs hypothyroid > synthroid adjusted LFTs stable Continue 200mg  amiodarone daily Continue 3.125mg  coreg BID   #) NICM s/p CRT-D Device functioning well, see paceart for details Adjustments to LV vector d/t diaphragmatic stim        Current medicines are reviewed at length with the patient today.   The patient does not have concerns regarding her medicines.  The following changes were made today:  none  Labs/ tests ordered today include:  Orders Placed This Encounter  Procedures   EKG 12-Lead     Disposition: Follow up with Dr. Graciela Husbands or EP APP in 6 months  Follow-up within 3 weeks with Dr.  Gala Romney   Signed, Sherie Don, NP  08/20/23  3:12 PM  Electrophysiology [facility]

## 2023-08-21 NOTE — Progress Notes (Signed)
Remote ICD transmission.   

## 2023-08-27 ENCOUNTER — Encounter (HOSPITAL_COMMUNITY): Payer: Self-pay

## 2023-09-01 ENCOUNTER — Telehealth (HOSPITAL_COMMUNITY): Payer: Self-pay | Admitting: Internal Medicine

## 2023-09-01 NOTE — Telephone Encounter (Signed)
Called patient at [phone] and front office was unable to leave a voicemail, patient did not answer the telephone call. Front office unable to speak with patient about upcoming appointment tomorrow with Dr. Gala Romney.

## 2023-09-02 ENCOUNTER — Encounter (HOSPITAL_COMMUNITY): Payer: Self-pay | Admitting: Internal Medicine

## 2023-09-02 ENCOUNTER — Ambulatory Visit (HOSPITAL_COMMUNITY)
Admission: RE | Admit: 2023-09-02 | Discharge: 2023-09-02 | Disposition: A | Discharge status: Home / Self Care | Payer: PPO | Source: Ambulatory Visit | Attending: Internal Medicine | Admitting: Internal Medicine

## 2023-09-02 ENCOUNTER — Encounter (INDEPENDENT_AMBULATORY_CARE_PROVIDER_SITE_OTHER): Payer: PPO

## 2023-09-02 VITALS — BP 124/68 | HR 70 | Wt 254.4 lb

## 2023-09-02 VITALS — Ht 68.0 in | Wt 245.0 lb

## 2023-09-02 DIAGNOSIS — Z955 Presence of coronary angioplasty implant and graft: Secondary | ICD-10-CM | POA: Insufficient documentation

## 2023-09-02 DIAGNOSIS — I4729 Other ventricular tachycardia: Secondary | ICD-10-CM | POA: Insufficient documentation

## 2023-09-02 DIAGNOSIS — E1122 Type 2 diabetes mellitus with diabetic chronic kidney disease: Secondary | ICD-10-CM | POA: Insufficient documentation

## 2023-09-02 DIAGNOSIS — I493 Ventricular premature depolarization: Secondary | ICD-10-CM | POA: Insufficient documentation

## 2023-09-02 DIAGNOSIS — I252 Old myocardial infarction: Secondary | ICD-10-CM | POA: Diagnosis not present

## 2023-09-02 DIAGNOSIS — I130 Hypertensive heart and chronic kidney disease with heart failure and stage 1 through stage 4 chronic kidney disease, or unspecified chronic kidney disease: Secondary | ICD-10-CM | POA: Diagnosis not present

## 2023-09-02 DIAGNOSIS — E1151 Type 2 diabetes mellitus with diabetic peripheral angiopathy without gangrene: Secondary | ICD-10-CM | POA: Diagnosis not present

## 2023-09-02 DIAGNOSIS — E039 Hypothyroidism, unspecified: Secondary | ICD-10-CM | POA: Insufficient documentation

## 2023-09-02 DIAGNOSIS — I447 Left bundle-branch block, unspecified: Secondary | ICD-10-CM | POA: Insufficient documentation

## 2023-09-02 DIAGNOSIS — I255 Ischemic cardiomyopathy: Secondary | ICD-10-CM | POA: Diagnosis present

## 2023-09-02 DIAGNOSIS — G4733 Obstructive sleep apnea (adult) (pediatric): Secondary | ICD-10-CM | POA: Diagnosis not present

## 2023-09-02 DIAGNOSIS — I5032 Chronic diastolic (congestive) heart failure: Secondary | ICD-10-CM | POA: Diagnosis not present

## 2023-09-02 DIAGNOSIS — Z0000 Encounter for general adult medical examination without abnormal findings: Secondary | ICD-10-CM

## 2023-09-02 DIAGNOSIS — I2510 Atherosclerotic heart disease of native coronary artery without angina pectoris: Secondary | ICD-10-CM | POA: Diagnosis not present

## 2023-09-02 DIAGNOSIS — Z79890 Hormone replacement therapy (postmenopausal): Secondary | ICD-10-CM | POA: Insufficient documentation

## 2023-09-02 DIAGNOSIS — Z95810 Presence of automatic (implantable) cardiac defibrillator: Secondary | ICD-10-CM

## 2023-09-02 DIAGNOSIS — N184 Chronic kidney disease, stage 4 (severe): Secondary | ICD-10-CM | POA: Diagnosis not present

## 2023-09-02 DIAGNOSIS — I5030 Unspecified diastolic (congestive) heart failure: Secondary | ICD-10-CM | POA: Diagnosis present

## 2023-09-02 DIAGNOSIS — Z7902 Long term (current) use of antithrombotics/antiplatelets: Secondary | ICD-10-CM | POA: Diagnosis not present

## 2023-09-02 DIAGNOSIS — I5022 Chronic systolic (congestive) heart failure: Secondary | ICD-10-CM | POA: Insufficient documentation

## 2023-09-02 DIAGNOSIS — Z794 Long term (current) use of insulin: Secondary | ICD-10-CM | POA: Diagnosis not present

## 2023-09-02 DIAGNOSIS — I428 Other cardiomyopathies: Secondary | ICD-10-CM | POA: Insufficient documentation

## 2023-09-02 NOTE — Progress Notes (Incomplete)
Advanced Heart Failure Clinic Note     PCP: Joaquim Nam, MD Primary Cardiologist: Julien Nordmann, MD  EP: Sherryl Manges, MD   Advanced HF: Dr. Gala Romney  Sleep Clinic: Dr. Mayford Knife   Reason for Visit/CC: HF follow up  HPI:  [name] is a 67 y.o. 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   Admitted on 6/20 with A/C biventricular HF. EF improved previously with CRT but back down again 10-15% (6/20) after CRT removal at [facility] due to sepsis with TV endocarditis. Placed on milrinone which was later weaned off. Unfortunately while hospitalized her husband had CVA and passed away on 19-Apr-2019. On Apr 19, 2019 she underwent CRT-D reimplantation and was discharged home.   She has made a steady recovery with CRT. Echo 6/21 EF 50-55%   Admitted 4/22 for osteomyelitis of left foot after failing outpatient management. Had initial surgery (I&D, bone biopsy, and sesamoidectomy). Vascular was consulted and she underwent CO2 angiogram with angioplasty of 3 separate in-stent stenoses in the left superficial femoral artery, and then revision of surgery (bone resection of 5th metatarsal head and base of the prox phalanx of the 5th).   Admitted 5/22 for cellulitis. She underwent amputation of left great toe. Repeat arterial studies showed a 50-59% stenosis on the distal left popliteal stent segment, no new interventions per vascular. ID was consulted and recommended 6 weeks of abx. Tunneled central line placed (PICC avoided due to CKD) and she was discharged on Daptomycin.   Echo 2/23 EF 50-55%   Follow up 6/23, NYHA III and volume OK.  Today she returns for HF follow up. Overall doing ok. Main issue is easy fatigue. Can do ADLs but has to take frequent breaks. Not much dyspnea just fatigue. No edema, orthopnea or PND. BP was low and  Dr. Graciela Husbands decreased meds, BP improved. Using CPAP. Has not had a download recently. (Has not seen Dr. Mayford Knife). She says her machine is reading a 4.   Echo 08/14/23 EF 55% small apical aneursym   Cardiac Studies  - Echo (2/23): EF 50-55%  - Echo (6/20): EF 10-15%, RV severely reduced, moderately enlarged, RVSP 45 mmHG, moderate RAE, moderate MR, mod/severe TR.  - RHC (6/20):  RA mean 16 PA 57/28 (40) PCWP mean 27 CO/CI (Fick) 5.37/2.24  Current Meds  Medication Sig   acetaminophen (TYLENOL) 500 MG tablet Take 2 tablets (1,000 mg total) by mouth every 6 (six) hours as needed for moderate pain or headache.   amiodarone (PACERONE) 200 MG tablet Take 0.5 tablets (100 mg total) by mouth every other day.   atorvastatin (LIPITOR) 10 MG tablet TAKE ONE TABLET BY MOUTH ONCE DAILY. NEED OFFICE VISIT FOR MORE REFILLS   carvedilol (COREG) 3.125 MG tablet Take 1 tablet (3.125 mg total) by mouth 2 (two) times daily.   clopidogrel (PLAVIX) 75 MG tablet TAKE ONE TABLET BY MOUTH ONCE A DAY   Continuous Blood Gluc Receiver (FREESTYLE LIBRE 2 READER) DEVI Use to check sugar multiple times per day as directed.  H/o DM2, treated with insulin.   Continuous Blood Gluc Receiver (FREESTYLE LIBRE 2 READER) DEVI FreeStyle Libre 2 Reader   Continuous Glucose Sensor (FREESTYLE LIBRE 2 SENSOR) MISC USE TO CHECK BLOOD SUGARS MULTIPLE TIMES DAILY AS DIRECTED DX:E11.9   dapagliflozin propanediol (FARXIGA) 10 MG TABS tablet  Take by mouth.   DULoxetine (CYMBALTA) 30 MG capsule TAKE ONE CAPSULE BY MOUTH EVERY NIGHT AT BEDTIME   gabapentin (NEURONTIN) 600 MG tablet Take 600 mg by mouth at bedtime.   insulin aspart (NOVOLOG FLEXPEN) 100 UNIT/ML FlexPen INJECT 14 UNITS WITH BREAKFAST AND 5-8 UNITS WITH DINNER   insulin glargine (LANTUS SOLOSTAR) 100 UNIT/ML Solostar Pen 8 units daily in PM   Insulin Pen Needle (PEN NEEDLES) 32G X 6 MM MISC 1 each by Does not apply route 4 (four) times daily.   levothyroxine (SYNTHROID) 25 MCG  tablet Take 25 mcg by mouth daily before breakfast.   Multiple Vitamin (MULTIVITAMIN WITH MINERALS) TABS tablet Take 1 tablet by mouth every morning.   nitroGLYCERIN (NITROSTAT) 0.4 MG SL tablet Place 1 tablet (0.4 mg total) under the tongue every 5 (five) minutes as needed for chest pain.   Omega-3 Fatty Acids (FISH OIL) 1000 MG CAPS Take 1,000 mg by mouth 2 (two) times a day.   ONETOUCH ULTRA test strip USE AS INSTRUCTED TO CHECK BLOOD SUGAR 3TIMES DAILY   polyethylene glycol (MIRALAX / GLYCOLAX) 17 g packet Take 17 g by mouth daily as needed for mild constipation.   Semaglutide,0.25 or 0.5MG /DOS, (OZEMPIC, 0.25 OR 0.5 MG/DOSE,) 2 MG/3ML SOPN Inject 0.5 mg as directed per week   torsemide (DEMADEX) 20 MG tablet TAKE ONE TABLET BY MOUTH TWICE A DAY   traMADol (ULTRAM) 50 MG tablet TAKE ONE (1) TO TWO (2) TABLETS (50-100 MG TOTAL) BY MOUTH AT BEDTIME AS NEEDED.   [DISCONTINUED] levothyroxine (SYNTHROID) 25 MCG tablet Take 1.5 tablets (37.5 mcg total) by mouth daily before breakfast.   Allergies  Allergen Reactions   Heparin Other (See Comments)    Can not remember reaction   Nsaids Other (See Comments)    Kidney disease   Past Medical History:  Diagnosis Date   Acute myocardial infarction, subendocardial infarction, initial episode of care (HCC) 07/21/2012   Acute osteomyelitis involving ankle and foot (HCC) 02/06/2015   Acute systolic heart failure (HCC) 07/21/2012   New onset 07/19/12; admission to [facility] ED. Elevated Troponins.  S/p 2D-echo with EF 20-25%.  S/p cardiac catheterization with stenting LAD.  Repeat 2D-echo 10/2011 with improved EF of 35%.    Anemia    Arthritis    Automatic implantable cardioverter-defibrillator in situ    a. MDT CRT-D 06/2014, SN: [id number]  .removed in feb 2020   CAD (coronary artery disease)    a. cardiac cath 101/04/2012: PCI/DES to chronically occluded mLAD, consideration PCI to diag branch in 4 weeks.    Cataract    Chicken pox    Chronic  kidney disease    Chronic systolic CHF (congestive heart failure) (HCC)    a. mixed ICM & NICM; b. EF 20-25% by echo 07/2012, mid-dist 2/3 of LV sev HK/AK, mild MR. echo 10/2012: EF 30-35%, sev HK ant-septal & inf walls, GR1DD, mild MR, PASP 33. c. echo 02/2013: EF 30%, GR1DD, mild MR. echo 04/2014: EF 30%, Septal-lat dyssynchrony, global HK, inf AK, GR1DD, mild MR. d. echo 10/2014: EF50-55%, WM nl, GR1DD, septal mild paradox. e. echo 02/2015: EF 50-55%, wm    Depression    Heart attack (HCC)    Heel ulcer (HCC) 04/27/2015   History of blood transfusion ~ 2011   "plasma; had neuropathy; couldn't walk"   Hypertension    Hypothyroidism    LBBB (left bundle branch block)    Neuromuscular disorder (HCC)    Neuropathy 2011  Obesity, unspecified    OSA on CPAP    Moderate with AHI 23/hr and now on CPAP at 16cm H2O.  Her DME is [facility]   Pure hypercholesterolemia    Sepsis (HCC) 09/2018   Type II diabetes mellitus (HCC)    Unspecified vitamin D deficiency    Family History  Problem Relation Age of Onset   Diabetes Mother    Hypertension Mother    Arthritis Mother        knees, lumbar DDD, cervical DDD   Cancer Father        prostate,skin,lymphoma.   Cancer Brother 49       bladder cancer; non-smoker   Diabetes Maternal Grandmother    Heart disease Maternal Grandmother    COPD Maternal Grandmother    Diabetes Paternal Grandmother    Obesity Brother    Diabetes Son    Hypertension Son    Cancer Maternal Grandfather    Diabetes Paternal Grandfather    Past Surgical History:  Procedure Laterality Date   ABDOMINAL AORTOGRAM W/LOWER EXTREMITY N/A 02/02/2021   Procedure: ABDOMINAL AORTOGRAM W/LOWER EXTREMITY;  Surgeon: Chuck Hint, MD;  Location: [facility] INVASIVE CV LAB;  Service: Cardiovascular;  Laterality: N/A;   AMPUTATION Left 03/02/2021   Procedure: AMPUTATION RAY, left great toe;  Surgeon: Vivi Barrack, DPM;  Location: [facility] OR;  Service: Podiatry;  Laterality: Left;    AMPUTATION TOE Right 06/18/2015   Procedure: AMPUTATION TOE;  Surgeon: Gwyneth Revels, DPM;  Location: [facility] ORS;  Service: Podiatry;  Laterality: Right;   AMPUTATION TOE Left 10/08/2018   Procedure: AMPUTATION TOE LEFT 2ND;  Surgeon: Recardo Evangelist, DPM;  Location: [facility] ORS;  Service: Podiatry;  Laterality: Left;   BACK SURGERY     BI-VENTRICULAR IMPLANTABLE CARDIOVERTER DEFIBRILLATOR N/A 07/06/2014   Procedure: BI-VENTRICULAR IMPLANTABLE CARDIOVERTER DEFIBRILLATOR  (CRT-D);  Surgeon: Duke Salvia, MD;  Location: [facility] CATH LAB;  Service: Cardiovascular;  Laterality: N/A;   BI-VENTRICULAR IMPLANTABLE CARDIOVERTER DEFIBRILLATOR  (CRT-D)  07/06/2014   BILATERAL OOPHORECTOMY  01/2011   ovarian cyst benign   BIV ICD INSERTION CRT-D N/A 03/31/2019   Procedure: BIV ICD INSERTION CRT-D;  Surgeon: Regan Lemming, MD;  Location: [facility] INVASIVE CV LAB;  Service: Cardiovascular;  Laterality: N/A;   COLONOSCOPY WITH PROPOFOL Left 02/22/2015   Procedure: COLONOSCOPY WITH PROPOFOL;  Surgeon: Wallace Cullens, MD;  Location: [facility] ENDOSCOPY;  Service: Endoscopy;  Laterality: Left;   CORONARY ANGIOPLASTY WITH STENT PLACEMENT Left 07/2012   new onset systolic CHF; elevated troponins.  Cardiac catheterization with stenting to LAD; EF 15%.  2D-echo: EF 20-25%.   ESOPHAGOGASTRODUODENOSCOPY N/A 02/22/2015   Procedure: ESOPHAGOGASTRODUODENOSCOPY (EGD);  Surgeon: Wallace Cullens, MD;  Location: [facility] ENDOSCOPY;  Service: Endoscopy;  Laterality: N/A;   I & D EXTREMITY Left 11/28/2020   Procedure: IRRIGATION AND DEBRIDEMENT OF FOOT;  Surgeon: Vivi Barrack, DPM;  Location: [facility] OR;  Service: Podiatry;  Laterality: Left;   I & D EXTREMITY Left 01/31/2021   Procedure: IRRIGATION AND DEBRIDEMENT EXTREMITY OF LEFT FOOT WITH BONE BIOPSY OF  1ST METATARSAL AND FIFTH METATARSAL;  Surgeon: Candelaria Stagers, DPM;  Location: [facility] OR;  Service: Podiatry;  Laterality: Left;   I & D EXTREMITY Left 02/03/2021   Procedure: IRRIGATION AND DEBRIDEMENT  EXTREMITY WITH INTERNAL AMPUTATION FIRST AND FIFTH RAY;  Surgeon: Candelaria Stagers, DPM;  Location: [facility] OR;  Service: Podiatry;  Laterality: Left;   INCISION AND DRAINAGE ABSCESS Right 2007   groin; with ICU stay  due to sepsis.   IR FLUORO GUIDE CV LINE RIGHT  03/06/2021   IR REMOVAL TUN CV CATH W/O FL  04/27/2021   IR US GUIDE VASC ACCESS RIGHT  03/06/2021   IRRIGATION AND DEBRIDEMENT FOOT Left 04/25/2021   Procedure: EXCISION AND DEBRIDEMENT WOUND ULCER LEFT FOOT; METATARSECTOMY;  Surgeon: Vivi Barrack, DPM;  Location: [facility] ORS;  Service: Podiatry;  Laterality: Left;   LAPAROSCOPIC CHOLECYSTECTOMY  2011   LEFT HEART CATH AND CORONARY ANGIOGRAPHY N/A 10/05/2018   Procedure: LEFT HEART CATH AND CORONARY ANGIOGRAPHY;  Surgeon: Antonieta Iba, MD;  Location: [facility] INVASIVE CV LAB;  Service: Cardiovascular;  Laterality: N/A;   LEFT HEART CATHETERIZATION WITH CORONARY ANGIOGRAM N/A 07/21/2012   Procedure: LEFT HEART CATHETERIZATION WITH CORONARY ANGIOGRAM;  Surgeon: Dolores Patty, MD;  Location: [facility] CATH LAB;  Service: Cardiovascular;  Laterality: N/A;   LUMBAR DISC SURGERY  1996   L4-5   PACEMAKER REMOVAL  11/2018   due to infection around pacemaker   PERCUTANEOUS CORONARY STENT INTERVENTION (PCI-S) N/A 07/23/2012   Procedure: PERCUTANEOUS CORONARY STENT INTERVENTION (PCI-S);  Surgeon: Tonny Bollman, MD;  Location: [facility] CATH LAB;  Service: Cardiovascular;  Laterality: N/A;   PERIPHERAL VASCULAR BALLOON ANGIOPLASTY Left 10/06/2018   Procedure: PERIPHERAL VASCULAR BALLOON ANGIOPLASTY;  Surgeon: Renford Dills, MD;  Location: [facility] INVASIVE CV LAB;  Service: Cardiovascular;  Laterality: Left;   PERIPHERAL VASCULAR CATHETERIZATION N/A 02/10/2015   Procedure: Picc Line Insertion;  Surgeon: Renford Dills, MD;  Location: [facility] INVASIVE CV LAB;  Service: Cardiovascular;  Laterality: N/A;   RIGHT HEART CATH N/A 03/29/2019   Procedure: RIGHT HEART CATH;  Surgeon: Dolores Patty, MD;  Location:  [facility] INVASIVE CV LAB;  Service: Cardiovascular;  Laterality: N/A;   TEE WITHOUT CARDIOVERSION N/A 10/02/2018   Procedure: TRANSESOPHAGEAL ECHOCARDIOGRAM (TEE);  Surgeon: Iran Ouch, MD;  Location: [facility] ORS;  Service: Cardiovascular;  Laterality: N/A;   TOTAL KNEE ARTHROPLASTY Right 05/07/2022   Procedure: TOTAL KNEE ARTHROPLASTY;  Surgeon: Durene Romans, MD;  Location: [facility] ORS;  Service: Orthopedics;  Laterality: Right;   TUBAL LIGATION  1981   VAGINAL HYSTERECTOMY  01/2011   Fibroids/DUB.  Ovaries removed. [place].   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  Tobacco Use   Smoking status: Never   Smokeless tobacco: Never  Vaping Use   Vaping status: Never Used  Substance and Sexual Activity   Alcohol use: No    Alcohol/week: 0.0 standard drinks of alcohol   Drug use: No   Sexual activity: Not Currently    Birth control/protection: Post-menopausal, Surgical  Other Topics Concern   Not on file  Social History Narrative   Widowed, husband died when she was in surgery.        Children:  2 children (daughter, son). Two grandsons and 2 step grandchildren.      Lives: with husband, daughter, son-in-law, 2 grandsons, and 1 on the way.      Employment: disability for peripheral neuropathy 2012.  Previously had home daycare.   Social Determinants of Health   Financial Resource Strain: Low Risk  (09/02/2023)   Overall Financial  Resource Strain (CARDIA)    Difficulty of Paying Living Expenses: Not hard at all  Food Insecurity: No Food Insecurity (09/02/2023)   Hunger Vital Sign    Worried About Running Out of Food in the Last Year: Never true    Ran Out of Food in the Last  Year: Never true  Transportation Needs: No Transportation Needs (09/02/2023)   PRAPARE - Administrator, Civil Service (Medical): No    Lack of Transportation (Non-Medical): No  Physical Activity: Inactive (03/17/2023)   Exercise Vital Sign    Days of Exercise per Week: 0 days    Minutes of Exercise per Session: 0 min  Stress: No Stress Concern Present (09/02/2023)   [employer] of Occupational Health - Occupational Stress Questionnaire    Feeling of Stress : Not at all  Social Connections: Socially Isolated (09/02/2023)   Social Connection and Isolation Panel [NHANES]    Frequency of Communication with Friends and Family: More than three times a week    Frequency of Social Gatherings with Friends and Family: More than three times a week    Attends Religious Services: Never    Database administrator or Organizations: No    Attends Banker Meetings: Never    Marital Status: Widowed  Intimate Partner Violence: Not At Risk (09/02/2023)   Humiliation, Afraid, Rape, and Kick questionnaire    Fear of Current or Ex-Partner: No    Emotionally Abused: No    Physically Abused: No    Sexually Abused: No    Lipid Panel     Component Value Date/Time   CHOL 135 11/18/2022 1451   CHOL 128 09/26/2020 1216   TRIG 193.0 (H) 11/18/2022 1451   HDL 34.50 (L) 11/18/2022 1451   HDL 44 09/26/2020 1216   CHOLHDL 4 11/18/2022 1451   VLDL 38.6 11/18/2022 1451   LDLCALC 62 11/18/2022 1451   LDLCALC 60 09/26/2020 1216   Wt Readings from Last 3 Encounters:  09/02/23 115.4 kg (254 lb 6.4 oz)  09/02/23 111.1 kg (245 lb)  08/20/23 113.9 kg (251 lb)   BP 124/68   Pulse 70   Wt 115.4 kg (254 lb 6.4 oz)   SpO2 98%   BMI 38.68 kg/m   Physical Exam General:  Obese. No resp difficulty HEENT: normal Neck: supple. no JVD. Carotids 2+ bilat; no bruits. No lymphadenopathy or thryomegaly appreciated. Cor: Regular rate & rhythm. No rubs, gallops or murmurs. Lungs:  clear Abdomen: obese soft, nontender, nondistended. No hepatosplenomegaly. No bruits or masses. Good bowel sounds. Extremities: no cyanosis, clubbing, rash, edema Neuro: alert & orientedx3, cranial nerves grossly intact. moves all 4 extremities w/o difficulty. Affect pleasant  ECG (personally reviewed): a-sensed v-paced, 74 bpm QRS 174 msec  Optivol (personally reviewed): OptiVol ok . No AF/VT. Effective BiV pacing way down. Activity level < 1hr/day Personally reviewed   ASSESSMENT AND PLAN:   1. Chronic Systolic HF with recovered EF/ Mixed Ischemic/NICM w/ LBBB:  - Echo 6/20 EF 10-15%.(after extraction of CRT) - Echo 12/20 EF 35% s/p MDT CRT-D re-implant (initally removed due to sepsis).  - Echo 12/21 EF 50-55% (normalized with CRT-D) - Echo 2/23 EF 50-55%  - Worse today,. NYHA III-IIIb in setting of loss of effective BiV pacing (see iCD interrogation) - Continue torsemide 40 mg daily. - Continue carvedilol 3.125 mg bid. - Continue Imdur 30 mg daily. - Off losartan per Nephrology with hyperkalemia - Continue Farxiga 10 - No Entresto or spiro with CKD  IV. - Have reached out to Dr. Graciela Husbands and EP Clinic regarding his loss of effective BiV pacing and they will see her soon  .  2. CKD, Stage IV:  - Followed by nephrology (Dr. Luther Redo [facility]). - Now off losartan with hyperkalemia - Baseline creatinine 2.3-2.8 - Last Scr 1.8 on 07/29/23  - Labs today.  3. PAT & NSVT, Frequent PVCs:  - Suppressed on amiodarone 100 mg daily. - Denies palpitations. Followed by Dr. Graciela Husbands. - Check LFTs, recent TSH ok.  4. Hypothyroidism:  - On synthroid.  5. CAD:   - s/p prior MI>>LAD DES.  - No s/s angina - Continue Plavix and statin.   6. OSA:  - Compliant w/ CPAP using nasal device and chin strap. - It sounds like download shows AHI of 4 - I have reached out to Sleep Clinic to reconnect her  7. DM2: - She is on insulin - Recent A1c 6.5 - Managed by PCP.  8. PVD: - s/p  angioplasty and stenting of LLE 2019 - s/p CO2 ateriogram w/ angioplasty of 3 separate in-stent stenoses in left femoral artery 2022. - Follows with [facility]  I spent a total of 40 minutes today: 1) reviewing the patient's medical records including previous charts, labs and recent notes from other providers; 2) examining the patient and counseling them on their medical issues/explaining the plan of care; 3) adjusting meds as needed and 4) ordering lab work or other needed tests.     Arvilla Meres, MD  09/02/23

## 2023-09-02 NOTE — Patient Instructions (Addendum)
Medication Changes:  No Changes In Medications at this time.   PLEASE SCHEDULE FOLLOW UP WITH ELECTROPHYSIOLOGY- MESSAGE SENT- THEY WILL CALL WITH APPOINTMENT   Follow-Up in: 6 months PLEASE CALL OUR OFFICE AROUND march 2025 TO GET SCHEDULED FOR YOUR APPOINTMENT. PHONE NUMBER IS [phone] OPTION 2   At the [facility], you and your health needs are our priority. We have a designated team specialized in the treatment of Heart Failure. This Care Team includes your primary Heart Failure Specialized Cardiologist (physician), Advanced Practice Providers (APPs- Physician Assistants and Nurse Practitioners), and Pharmacist who all work together to provide you with the care you need, when you need it.   You may see any of the following providers on your designated Care Team at your next follow up:  Dr. Arvilla Meres Dr. Marca Ancona Dr. Dorthula Nettles Dr. Theresia Bough Tonye Becket, NP Robbie Lis, [place] [facility] [place], [place] Brynda Peon, NP [place] Lee, NP Karle Plumber, PharmD   Please be sure to bring in all your medications bottles to every appointment.   Need to Contact us:  If you have any questions or concerns before your next appointment please send [place] a message through [place] or call our office at [phone].    TO LEAVE A MESSAGE FOR THE NURSE SELECT OPTION 2, PLEASE LEAVE A MESSAGE INCLUDING: YOUR NAME DATE OF BIRTH CALL BACK NUMBER REASON FOR CALL**this is important as we prioritize the call backs  YOU WILL RECEIVE A CALL BACK THE SAME DAY AS LONG AS YOU CALL BEFORE 4:00 PM

## 2023-09-02 NOTE — Progress Notes (Deleted)
This encounter was created in error - please disregard.

## 2023-09-08 DIAGNOSIS — I951 Orthostatic hypotension: Secondary | ICD-10-CM | POA: Insufficient documentation

## 2023-09-08 DIAGNOSIS — I428 Other cardiomyopathies: Secondary | ICD-10-CM | POA: Insufficient documentation

## 2023-09-09 ENCOUNTER — Other Ambulatory Visit: Payer: Self-pay | Admitting: Family Medicine

## 2023-09-09 ENCOUNTER — Other Ambulatory Visit (INDEPENDENT_AMBULATORY_CARE_PROVIDER_SITE_OTHER): Payer: PPO

## 2023-09-09 ENCOUNTER — Encounter: Payer: Self-pay | Admitting: Internal Medicine

## 2023-09-09 ENCOUNTER — Ambulatory Visit: Payer: PPO | Attending: Internal Medicine | Admitting: Internal Medicine

## 2023-09-09 VITALS — BP 124/60 | HR 73 | Ht 68.0 in | Wt 254.4 lb

## 2023-09-09 DIAGNOSIS — I428 Other cardiomyopathies: Secondary | ICD-10-CM | POA: Diagnosis not present

## 2023-09-09 DIAGNOSIS — I951 Orthostatic hypotension: Secondary | ICD-10-CM

## 2023-09-09 DIAGNOSIS — E1122 Type 2 diabetes mellitus with diabetic chronic kidney disease: Secondary | ICD-10-CM

## 2023-09-09 DIAGNOSIS — Z95810 Presence of automatic (implantable) cardiac defibrillator: Secondary | ICD-10-CM

## 2023-09-09 DIAGNOSIS — E870 Hyperosmolality and hypernatremia: Secondary | ICD-10-CM | POA: Diagnosis not present

## 2023-09-09 DIAGNOSIS — I493 Ventricular premature depolarization: Secondary | ICD-10-CM

## 2023-09-09 DIAGNOSIS — Z794 Long term (current) use of insulin: Secondary | ICD-10-CM

## 2023-09-09 DIAGNOSIS — I5022 Chronic systolic (congestive) heart failure: Secondary | ICD-10-CM

## 2023-09-09 LAB — HEPATIC FUNCTION PANEL
ALT: 10 U/L (ref 0–35)
AST: 15 U/L (ref 0–37)
Albumin: 3.9 g/dL (ref 3.5–5.2)
Alkaline Phosphatase: 106 U/L (ref 39–117)
Bilirubin, Direct: 0.1 mg/dL (ref 0.0–0.3)
Total Bilirubin: 0.4 mg/dL (ref 0.2–1.2)
Total Protein: 6.5 g/dL (ref 6.0–8.3)

## 2023-09-09 LAB — LIPID PANEL
Cholesterol: 138 mg/dL (ref 0–200)
HDL: 35.5 mg/dL — ABNORMAL LOW (ref >39.00)
LDL Cholesterol: 69 mg/dL (ref 0–99)
NonHDL: 102.01
Total CHOL/HDL Ratio: 4
Triglycerides: 167 mg/dL — ABNORMAL HIGH (ref 0.0–149.0)
VLDL: 33.4 mg/dL (ref 0.0–40.0)

## 2023-09-09 LAB — BASIC METABOLIC PANEL
BUN: 29 mg/dL — ABNORMAL HIGH (ref 6–23)
CO2: 32 meq/L (ref 19–32)
Calcium: 9 mg/dL (ref 8.4–10.5)
Chloride: 103 meq/L (ref 96–112)
Creatinine, Ser: 2.33 mg/dL — ABNORMAL HIGH (ref 0.40–1.20)
GFR: 20.82 mL/min — ABNORMAL LOW (ref >60.00)
Glucose, Bld: 136 mg/dL — ABNORMAL HIGH (ref 70–99)
Potassium: 4.6 meq/L (ref 3.5–5.1)
Sodium: 143 meq/L (ref 135–145)

## 2023-09-09 LAB — TSH: TSH: 0.91 u[IU]/mL (ref 0.35–5.50)

## 2023-09-09 LAB — HEMOGLOBIN A1C: Hgb A1c MFr Bld: 6.4 % (ref 4.6–6.5)

## 2023-09-09 NOTE — Patient Instructions (Signed)
Medication Instructions:  Your physician recommends that you continue on your current medications as directed. Please refer to the Current Medication list given to you today.  *If you need a refill on your cardiac medications before your next appointment, please call your pharmacy*   Lab Work: None ordered.  If you have labs (blood work) drawn today and your tests are completely normal, you will receive your results only by: MyChart Message (if you have MyChart) OR A paper copy in the mail If you have any lab test that is abnormal or we need to change your treatment, we will call you to review the results.   Testing/Procedures: None ordered.    Follow-Up: At [facility], you and your health needs are our priority.  As part of our continuing mission to provide you with exceptional heart care, we have created designated Provider Care Teams.  These Care Teams include your primary Cardiologist (physician) and Advanced Practice Providers (APPs -  Physician Assistants and Nurse Practitioners) who all work together to provide you with the care you need, when you need it.  We recommend signing up for the patient portal called "MyChart".  Sign up information is provided on this After Visit Summary.  MyChart is used to connect with patients for Virtual Visits (Telemedicine).  Patients are able to view lab/test results, encounter notes, upcoming appointments, etc.  Non-urgent messages can be sent to your provider as well.   To learn more about what you can do with MyChart, go to [email or web address].    Your next appointment:   6 weeks with Dr Graciela Husbands

## 2023-09-09 NOTE — Progress Notes (Signed)
Patient ID: [name], female   DOB: 10-18-1955, 67 y.o.   MRN: [id number]      Patient Care Team: Joaquim Nam, MD as PCP - General (Family Medicine) Antonieta Iba, MD as PCP - Cardiology (Cardiology) Duke Salvia, MD as PCP - Electrophysiology (Cardiology) Bensimhon, Bevelyn Buckles, MD as PCP - Advanced Heart Failure (Cardiology) Lamont Dowdy, MD as Consulting Physician (Internal Medicine) Orland Jarred, Blanchie Serve (Inactive) as Attending Physician (Podiatry) Lynn Ito, MD as Consulting Physician (Infectious Diseases) Glendale Chard, DO as Consulting Physician (Neurology) Kathyrn Sheriff, [facility] (Inactive) as Pharmacist (Pharmacist) Rickard Patience, MD as Consulting Physician (Oncology) Pa, [facility]   HPI  [name] is a 67 y.o. female CRT-D implanted for nonischemic cardiomyopathy with concomitant coronary disease.. Developed recurrent MSSA bacteremia and underwent system extraction 3/20 with aspiration of the thrombus of the lead.  Recurrent heart failure and interval worsening of LV function prompted decision to reimplant done [facility]) 6/20  Following reimplantation LV function has improved considerably (see below)  History of atrial tachycardia and PVCs  for which she has been treated with low dose amiodarone. Patient denies symptoms of  GI intolerance, sun sensitivity, neurological symptoms attributable to amiodarone.     no coughing  The patient denies chest pain, shortness of breath, nocturnal dyspnea, orthopnea or peripheral edema.  There have been no palpitations or syncope.   Significant lightheadedness.  Addressed by renal with changing her carvedilol to once a day and discontinued her isosorbide with some improvement.  Still with orthostatic dizziness.  Chronic modest shortness of breath with interval worsening;    Interval  development of diaphragmatic stimulation.  Last chest x-ray 1/23 compared with post implant without  significant change.  DATE TEST EF%    9/15  echo  30 %    1 /17 echo  50 %    12/19 Echo  45-50%    12/19 TEE   No vegetations  12/19 LHC   pLAD80 with ISR; mLAD70 pCx 80;D1 90 mRCA 40  2/20 CT     2/20 TEE    3/20  (2) Echo  40%   6/20 Echo  10-15%   12/20 Echo 35%   6/21 Echo  50-55%   2/23 Echo  50-55%     Date Cr K Hgb TSH LFTs   LDL  3/20 3.54>>1.18  11 4.64  18   7/20 2.15 4.9 9.7 (6/20)17.4    18   9/20  2.02 4.3  21.4  55  11/20 2.17 4.7 11.2 6.17 23   1/21 2.44 4.9      12/21 2.09 5.1 11.6     6/22  (3/22)2.48 1.58 (3/22)4.7 4.2 (3/22)13.0 10.8     2/23 2.15<<1.87   1.12  43  8/23 2.08 5.1 9.4  15   9/24 2.15 4.7 12.5       Past Medical History:  Diagnosis Date   Acute myocardial infarction, subendocardial infarction, initial episode of care (HCC) 07/21/2012   Acute osteomyelitis involving ankle and foot (HCC) 02/06/2015   Acute systolic heart failure (HCC) 07/21/2012   New onset 07/19/12; admission to [facility] ED. Elevated Troponins.  S/p 2D-echo with EF 20-25%.  S/p cardiac catheterization with stenting LAD.  Repeat 2D-echo 10/2011 with improved EF of 35%.    Anemia    Arthritis    Automatic implantable cardioverter-defibrillator in situ    a. MDT CRT-D 06/2014, SN: [id number]  .removed in feb 2020  CAD (coronary artery disease)    a. cardiac cath 101/04/2012: PCI/DES to chronically occluded mLAD, consideration PCI to diag branch in 4 weeks.    Cataract    Chicken pox    Chronic kidney disease    Chronic systolic CHF (congestive heart failure) (HCC)    a. mixed ICM & NICM; b. EF 20-25% by echo 07/2012, mid-dist 2/3 of LV sev HK/AK, mild MR. echo 10/2012: EF 30-35%, sev HK ant-septal & inf walls, GR1DD, mild MR, PASP 33. c. echo 02/2013: EF 30%, GR1DD, mild MR. echo 04/2014: EF 30%, Septal-lat dyssynchrony, global HK, inf AK, GR1DD, mild MR. d. echo 10/2014: EF50-55%, WM nl, GR1DD, septal mild paradox. e. echo 02/2015: EF 50-55%, wm    Depression    Heart  attack (HCC)    Heel ulcer (HCC) 04/27/2015   History of blood transfusion ~ 2011   "plasma; had neuropathy; couldn't walk"   Hypertension    Hypothyroidism    LBBB (left bundle branch block)    Neuromuscular disorder (HCC)    Neuropathy 2011   Obesity, unspecified    OSA on CPAP    Moderate with AHI 23/hr and now on CPAP at 16cm H2O.  Her DME is [facility]   Pure hypercholesterolemia    Sepsis (HCC) 09/2018   Type II diabetes mellitus (HCC)    Unspecified vitamin D deficiency    Past Surgical History:  Procedure Laterality Date   ABDOMINAL AORTOGRAM W/LOWER EXTREMITY N/A 02/02/2021   Procedure: ABDOMINAL AORTOGRAM W/LOWER EXTREMITY;  Surgeon: Chuck Hint, MD;  Location: [facility] INVASIVE CV LAB;  Service: Cardiovascular;  Laterality: N/A;   AMPUTATION Left 03/02/2021   Procedure: AMPUTATION RAY, left great toe;  Surgeon: Vivi Barrack, DPM;  Location: [facility] OR;  Service: Podiatry;  Laterality: Left;   AMPUTATION TOE Right 06/18/2015   Procedure: AMPUTATION TOE;  Surgeon: Gwyneth Revels, DPM;  Location: [facility] ORS;  Service: Podiatry;  Laterality: Right;   AMPUTATION TOE Left 10/08/2018   Procedure: AMPUTATION TOE LEFT 2ND;  Surgeon: Recardo Evangelist, DPM;  Location: [facility] ORS;  Service: Podiatry;  Laterality: Left;   BACK SURGERY     BI-VENTRICULAR IMPLANTABLE CARDIOVERTER DEFIBRILLATOR N/A 07/06/2014   Procedure: BI-VENTRICULAR IMPLANTABLE CARDIOVERTER DEFIBRILLATOR  (CRT-D);  Surgeon: Duke Salvia, MD;  Location[facility] CATH LAB;  Service: Cardiovascular;  Laterality: N/A;   BI-VENTRICULAR IMPLANTABLE CARDIOVERTER DEFIBRILLATOR  (CRT-D)  07/06/2014   BILATERAL OOPHORECTOMY  01/2011   ovarian cyst benign   BIV ICD INSERTION CRT-D N/A 03/31/2019   Procedure: BIV ICD INSERTION CRT-D;  Surgeon: Regan Lemming, MD;  Location: [facility] INVASIVE CV LAB;  Service: Cardiovascular;  Laterality: N/A;   COLONOSCOPY WITH PROPOFOL Left 02/22/2015   Procedure: COLONOSCOPY WITH PROPOFOL;  Surgeon: Wallace Cullens,  MD;  Location: [facility] ENDOSCOPY;  Service: Endoscopy;  Laterality: Left;   CORONARY ANGIOPLASTY WITH STENT PLACEMENT Left 07/2012   new onset systolic CHF; elevated troponins.  Cardiac catheterization with stenting to LAD; EF 15%.  2D-echo: EF 20-25%.   ESOPHAGOGASTRODUODENOSCOPY N/A 02/22/2015   Procedure: ESOPHAGOGASTRODUODENOSCOPY (EGD);  Surgeon: Wallace Cullens, MD;  Location: [facility] ENDOSCOPY;  Service: Endoscopy;  Laterality: N/A;   I & D EXTREMITY Left 11/28/2020   Procedure: IRRIGATION AND DEBRIDEMENT OF FOOT;  Surgeon: Vivi Barrack, DPM;  Location: [facility] OR;  Service: Podiatry;  Laterality: Left;   I & D EXTREMITY Left 01/31/2021   Procedure: IRRIGATION AND DEBRIDEMENT EXTREMITY OF LEFT FOOT WITH BONE BIOPSY OF  1ST METATARSAL AND FIFTH  METATARSAL;  Surgeon: Candelaria Stagers, DPM;  Location: [facility] OR;  Service: Podiatry;  Laterality: Left;   I & D EXTREMITY Left 02/03/2021   Procedure: IRRIGATION AND DEBRIDEMENT EXTREMITY WITH INTERNAL AMPUTATION FIRST AND FIFTH RAY;  Surgeon: Candelaria Stagers, DPM;  Location: [facility] OR;  Service: Podiatry;  Laterality: Left;   INCISION AND DRAINAGE ABSCESS Right 2007   groin; with ICU stay due to sepsis.   IR FLUORO GUIDE CV LINE RIGHT  03/06/2021   IR REMOVAL TUN CV CATH W/O FL  04/27/2021   IR US GUIDE VASC ACCESS RIGHT  03/06/2021   IRRIGATION AND DEBRIDEMENT FOOT Left 04/25/2021   Procedure: EXCISION AND DEBRIDEMENT WOUND ULCER LEFT FOOT; METATARSECTOMY;  Surgeon: Vivi Barrack, DPM;  Location: [facility] ORS;  Service: Podiatry;  Laterality: Left;   LAPAROSCOPIC CHOLECYSTECTOMY  2011   LEFT HEART CATH AND CORONARY ANGIOGRAPHY N/A 10/05/2018   Procedure: LEFT HEART CATH AND CORONARY ANGIOGRAPHY;  Surgeon: Antonieta Iba, MD;  Location: [facility] INVASIVE CV LAB;  Service: Cardiovascular;  Laterality: N/A;   LEFT HEART CATHETERIZATION WITH CORONARY ANGIOGRAM N/A 07/21/2012   Procedure: LEFT HEART CATHETERIZATION WITH CORONARY ANGIOGRAM;  Surgeon: Dolores Patty, MD;   Location: [facility] CATH LAB;  Service: Cardiovascular;  Laterality: N/A;   LUMBAR DISC SURGERY  1996   L4-5   PACEMAKER REMOVAL  11/2018   due to infection around pacemaker   PERCUTANEOUS CORONARY STENT INTERVENTION (PCI-S) N/A 07/23/2012   Procedure: PERCUTANEOUS CORONARY STENT INTERVENTION (PCI-S);  Surgeon: Tonny Bollman, MD;  Location: [facility] CATH LAB;  Service: Cardiovascular;  Laterality: N/A;   PERIPHERAL VASCULAR BALLOON ANGIOPLASTY Left 10/06/2018   Procedure: PERIPHERAL VASCULAR BALLOON ANGIOPLASTY;  Surgeon: Renford Dills, MD;  Location: [facility] INVASIVE CV LAB;  Service: Cardiovascular;  Laterality: Left;   PERIPHERAL VASCULAR CATHETERIZATION N/A 02/10/2015   Procedure: Picc Line Insertion;  Surgeon: Renford Dills, MD;  Location: [facility] INVASIVE CV LAB;  Service: Cardiovascular;  Laterality: N/A;   RIGHT HEART CATH N/A 03/29/2019   Procedure: RIGHT HEART CATH;  Surgeon: Dolores Patty, MD;  Location: [facility] INVASIVE CV LAB;  Service: Cardiovascular;  Laterality: N/A;   TEE WITHOUT CARDIOVERSION N/A 10/02/2018   Procedure: TRANSESOPHAGEAL ECHOCARDIOGRAM (TEE);  Surgeon: Iran Ouch, MD;  Location: [facility] ORS;  Service: Cardiovascular;  Laterality: N/A;   TOTAL KNEE ARTHROPLASTY Right 05/07/2022   Procedure: TOTAL KNEE ARTHROPLASTY;  Surgeon: Durene Romans, MD;  Location: [facility] ORS;  Service: Orthopedics;  Laterality: Right;   TUBAL LIGATION  1981   VAGINAL HYSTERECTOMY  01/2011   Fibroids/DUB.  Ovaries removed. [place].   Current Meds  Medication Sig   acetaminophen (TYLENOL) 500 MG tablet Take 2 tablets (1,000 mg total) by mouth every 6 (six) hours as needed for moderate pain or headache.   amiodarone (PACERONE) 200 MG tablet Take 0.5 tablets (100 mg total) by mouth every other day.   atorvastatin (LIPITOR) 10 MG tablet TAKE ONE TABLET BY MOUTH ONCE DAILY. NEED OFFICE VISIT FOR MORE REFILLS   carvedilol (COREG) 3.125 MG tablet Take 1 tablet (3.125 mg total) by mouth 2 (two) times  daily.   clopidogrel (PLAVIX) 75 MG tablet TAKE ONE TABLET BY MOUTH ONCE A DAY   Continuous Blood Gluc Receiver (FREESTYLE LIBRE 2 READER) DEVI Use to check sugar multiple times per day as directed.  H/o DM2, treated with insulin.   Continuous Blood Gluc Receiver (FREESTYLE LIBRE 2 READER) DEVI FreeStyle Libre 2 Reader   Continuous Glucose Sensor (FREESTYLE [place]  2 SENSOR) MISC USE TO CHECK BLOOD SUGARS MULTIPLE TIMES DAILY AS DIRECTED DX:E11.9   dapagliflozin propanediol (FARXIGA) 10 MG TABS tablet Take by mouth.   DULoxetine (CYMBALTA) 30 MG capsule TAKE ONE CAPSULE BY MOUTH EVERY NIGHT AT BEDTIME   gabapentin (NEURONTIN) 600 MG tablet Take 600 mg by mouth at bedtime.   insulin aspart (NOVOLOG FLEXPEN) 100 UNIT/ML FlexPen INJECT 14 UNITS WITH BREAKFAST AND 5-8 UNITS WITH DINNER   insulin glargine (LANTUS SOLOSTAR) 100 UNIT/ML Solostar Pen 8 units daily in PM   Insulin Pen Needle (PEN NEEDLES) 32G X 6 MM MISC 1 each by Does not apply route 4 (four) times daily.   levothyroxine (SYNTHROID) 25 MCG tablet Take 25 mcg by mouth daily before breakfast.   Multiple Vitamin (MULTIVITAMIN WITH MINERALS) TABS tablet Take 1 tablet by mouth every morning.   nitroGLYCERIN (NITROSTAT) 0.4 MG SL tablet Place 1 tablet (0.4 mg total) under the tongue every 5 (five) minutes as needed for chest pain.   Omega-3 Fatty Acids (FISH OIL) 1000 MG CAPS Take 1,000 mg by mouth 2 (two) times a day.   ONETOUCH ULTRA test strip USE AS INSTRUCTED TO CHECK BLOOD SUGAR 3TIMES DAILY   polyethylene glycol (MIRALAX / GLYCOLAX) 17 g packet Take 17 g by mouth daily as needed for mild constipation.   Semaglutide,0.25 or 0.5MG /DOS, (OZEMPIC, 0.25 OR 0.5 MG/DOSE,) 2 MG/3ML SOPN Inject 0.5 mg as directed per week   torsemide (DEMADEX) 20 MG tablet TAKE ONE TABLET BY MOUTH TWICE A DAY   traMADol (ULTRAM) 50 MG tablet TAKE ONE (1) TO TWO (2) TABLETS (50-100 MG TOTAL) BY MOUTH AT BEDTIME AS NEEDED.   Allergies  Allergen Reactions    Heparin Other (See Comments)    Can not remember reaction   Nsaids Other (See Comments)    Kidney disease   Review of Systems negative except from HPI and PMH  Physical Exam: BP 124/60 (BP Location: Left Arm, Patient Position: Sitting, Cuff Size: Large)   Pulse 73   Ht 5\' 8"  (1.727 m)   Wt 254 lb 6 oz (115.4 kg)   SpO2 98%   BMI 38.68 kg/m  Well developed and Morbidly obese  in no acute distress HENT normal Neck supple  Clear Device pocket well healed; without hematoma or erythema.  There is no tethering  Regular rate and rhythm,   Abd-soft with active BS No Clubbing cyanosis no edema Skin-warm and dry A & Oriented  Grossly normal sensory and motor function  ECG AV pacing at discharge upright QRS lead V1 negative QRS lead I QRSd 186  Device function is  normal. Programming changes as outlined below to try to adjudicate loss of ventricular pacing with the last programming, bypass atrial under sensing and to force biventricular pacing See Paceart for details    ECG sinus P-synchronous/ AV  pacing  Negative QRS lead I positive QRS lead V1 rare PVC Vector express withdrawn, the only vector in which we did not have diaphragmatic stimulation was 2--coil.  Programmed in this way.  Device function is normal. Programming changes (As above)   See Paceart for details      Review of the tracing from 11/21 showed a QR in lead I and RS in lead V1  Assessment and  Plan  CRT-D recent reimplantation   Nonischemic cardiomyopathy  Coronary artery disease  Peripheral vascular disease  Orthostatic hypotension  Congestive heart failure- chronic-systolic class II  Renal insufficiency grade 3   Hypothyroidism  High  Risk Medication Surveillance-Amiodarone   Atrial under sensing  PVCs  Recent amputation lower extremity digits  Continues with diaphragmatic stimulation.  She saw SR-NP a couple of weeks ago who reprogrammed her device and with that lost biventricular pacing  noted by Dr. Dorthea Cove.  Today we reprogrammed it again back to the 2-coil and widened the pulse with the hopes of trying to eliminate diaphragmatic stimulation.  We also noted intermittent atrial failure to sense as well as variability in antegrade conduction over the AV node so as to force biventricular pacing we needed sensed AV's of 70 ms.  We have programmed this way but we have also increased the lower rate from 50--70 to try to drive atrial pacing and avoid the issue of atrial under sensing

## 2023-09-14 LAB — CUP PACEART INCLINIC DEVICE CHECK
Date Time Interrogation Session: 20241203193138
Implantable Lead Connection Status: 753985
Implantable Lead Connection Status: 753985
Implantable Lead Connection Status: 753985
Implantable Lead Implant Date: 20200625
Implantable Lead Implant Date: 20200625
Implantable Lead Implant Date: 20200625
Implantable Lead Location: 753858
Implantable Lead Location: 753859
Implantable Lead Location: 753860
Implantable Lead Model: 4398
Implantable Lead Model: 5076
Implantable Lead Model: 6935
Implantable Pulse Generator Implant Date: 20200625

## 2023-09-16 ENCOUNTER — Ambulatory Visit (INDEPENDENT_AMBULATORY_CARE_PROVIDER_SITE_OTHER): Payer: PPO | Admitting: Family Medicine

## 2023-09-16 ENCOUNTER — Other Ambulatory Visit: Payer: Self-pay | Admitting: Family Medicine

## 2023-09-16 ENCOUNTER — Encounter: Payer: Self-pay | Admitting: Family Medicine

## 2023-09-16 VITALS — BP 118/72 | HR 77 | Temp 98.9°F | Ht 67.0 in | Wt 251.4 lb

## 2023-09-16 DIAGNOSIS — E785 Hyperlipidemia, unspecified: Secondary | ICD-10-CM

## 2023-09-16 DIAGNOSIS — E039 Hypothyroidism, unspecified: Secondary | ICD-10-CM

## 2023-09-16 DIAGNOSIS — Z0000 Encounter for general adult medical examination without abnormal findings: Secondary | ICD-10-CM

## 2023-09-16 DIAGNOSIS — Z7189 Other specified counseling: Secondary | ICD-10-CM

## 2023-09-16 DIAGNOSIS — N183 Chronic kidney disease, stage 3 unspecified: Secondary | ICD-10-CM

## 2023-09-16 DIAGNOSIS — E1122 Type 2 diabetes mellitus with diabetic chronic kidney disease: Secondary | ICD-10-CM | POA: Diagnosis not present

## 2023-09-16 DIAGNOSIS — M21379 Foot drop, unspecified foot: Secondary | ICD-10-CM | POA: Diagnosis not present

## 2023-09-16 DIAGNOSIS — I10 Essential (primary) hypertension: Secondary | ICD-10-CM

## 2023-09-16 DIAGNOSIS — Z794 Long term (current) use of insulin: Secondary | ICD-10-CM | POA: Diagnosis not present

## 2023-09-16 MED ORDER — LEVOTHYROXINE SODIUM 37.5 MCG PO CAPS: 1.0000 | ORAL_CAPSULE | Freq: Every day | ORAL | Status: DC

## 2023-09-16 MED ORDER — NOVOLOG FLEXPEN 100 UNIT/ML ~~LOC~~ SOPN: PEN_INJECTOR | SUBCUTANEOUS | Status: DC

## 2023-09-16 NOTE — Patient Instructions (Addendum)
LOWER MEALTIME INSULIN 10 UNITS WITH BREAKFAST AND 5 UNITS WITH SUPPER.  Update me in about 10 days, sooner if needed.  Take care.  Glad to see you. Plan on recheck in about 3-4 months at a visit.  A1c at the visit.

## 2023-09-16 NOTE — Progress Notes (Unsigned)
Diabetes:  Using medications without difficulties:yes Hypoglycemic episodes: occ down to 60, at night, she is aware of events.  Cautions d/w pt.  Dw pt about lowering dose of insulin.  See orders.    Hyperglycemic episodes: no Feet problems: decreased sensation in the feet.  Pain improved with tramadol.  Still on cymbalta and gabapentin.  Blood Sugars averaging: ~100 eye exam within last year:  Hypertension:    Using medication without problems or lightheadedness: yes Chest pain with exertion:no Edema: no Short of breath: stable SOB with exertion.   Labs d/w pt.    Elevated Cholesterol: Using medications without problems:yes Muscle aches: no Diet compliance: d/w pt.  Exercise: d/w pt.    CKD.  Not taking nsaids.  Has f/u pending for 10/2023.  Labs d/w pt.    Using walker at baseline.    D/w pt about cardiology f/u, re: pacer adjustment.  She improved with most recent adjustment.    Shingles done.  RSV done PNA done.  Covid done Flu done Tetanus up to date.  Daughter designated if patient were incapacitated.    Mammogram 2024.  DXA encouraged.   Colonoscopy up to date .  TSH wnl.  On levothyroxine, d/w pt.  Compliant.    Foot drop, L, using AFO at baseline.   PMH and SH reviewed.   Vital signs, Meds and allergies reviewed.  ROS: Per HPI unless specifically indicated in ROS section   GEN: nad, alert and oriented HEENT: ncat NECK: supple w/o LA CV: rrr.  no murmur PULM: ctab, no inc wob ABD: soft, +bs EXT: no edema SKIN: no acute rash  L ankle AFO at baseline.   Diabetic foot exam: Mult toe amputations.   No skin breakdown No calluses  Normal DP pulses Dec sensation to light tough and monofilament on R foot and absent on the L foot Nails normal

## 2023-09-17 DIAGNOSIS — Z0000 Encounter for general adult medical examination without abnormal findings: Secondary | ICD-10-CM | POA: Insufficient documentation

## 2023-09-17 DIAGNOSIS — E039 Hypothyroidism, unspecified: Secondary | ICD-10-CM | POA: Insufficient documentation

## 2023-09-17 DIAGNOSIS — M21379 Foot drop, unspecified foot: Secondary | ICD-10-CM | POA: Insufficient documentation

## 2023-09-17 NOTE — Assessment & Plan Note (Signed)
Continue atorvastatin carvedilol Plavix torsemide.  Labs discussed with patient.

## 2023-09-17 NOTE — Assessment & Plan Note (Signed)
Daughter designated if patient were incapacitated.

## 2023-09-17 NOTE — Assessment & Plan Note (Signed)
Shingles done.  RSV done PNA done.  Covid done Flu done Tetanus up to date.  Daughter designated if patient were incapacitated.    Mammogram 2024.  DXA encouraged.   Colonoscopy up to date .

## 2023-09-17 NOTE — Assessment & Plan Note (Signed)
Continue AFO use.  Will see about replacement hardware.  I will check with staff about this.

## 2023-09-17 NOTE — Assessment & Plan Note (Signed)
Continue levothyroxine as is.  TSH discussed with patient.

## 2023-09-17 NOTE — Assessment & Plan Note (Addendum)
Would decrease mealtime insulin to 10 units with breakfast and 5 units with supper. I asked her to update me about her sugars in about 10 days, sooner if needed.  Plan on recheck in about 3-4 months at a visit.  A1c at the visit.   No change in meds otherwise today.

## 2023-09-17 NOTE — Assessment & Plan Note (Signed)
Creatinine discussed with patient.  Avoid NSAIDs.  Continue Marcelline Deist for now.  Has renal follow-up pending.

## 2023-09-18 ENCOUNTER — Telehealth: Payer: Self-pay | Admitting: Family Medicine

## 2023-09-18 ENCOUNTER — Encounter: Payer: Self-pay | Admitting: Internal Medicine

## 2023-09-18 DIAGNOSIS — M21379 Foot drop, unspecified foot: Secondary | ICD-10-CM

## 2023-09-18 NOTE — Telephone Encounter (Signed)
Faxed along with last office notes and demographic information

## 2023-09-18 NOTE — Telephone Encounter (Signed)
Please send an order for a left ankle AFO to [facility] in [place].  Diagnosis M21.379, foot drop.  Thanks.

## 2023-09-18 NOTE — Addendum Note (Signed)
Addended by: Leonor Liv on: 09/18/2023 08:54 AM   Modules accepted: Orders

## 2023-09-18 NOTE — Telephone Encounter (Signed)
Thanks

## 2023-10-06 ENCOUNTER — Other Ambulatory Visit: Payer: Self-pay | Admitting: Family Medicine

## 2023-10-06 NOTE — Telephone Encounter (Signed)
LAST APPOINTMENT DATE: 09/16/23 CPE    NEXT APPOINTMENT DATE: N/A     LAST REFILL: 07/09/23   QTY: #60 2 rf

## 2023-10-09 DIAGNOSIS — R809 Proteinuria, unspecified: Secondary | ICD-10-CM | POA: Diagnosis not present

## 2023-10-09 DIAGNOSIS — E1122 Type 2 diabetes mellitus with diabetic chronic kidney disease: Secondary | ICD-10-CM | POA: Diagnosis not present

## 2023-10-09 DIAGNOSIS — I509 Heart failure, unspecified: Secondary | ICD-10-CM | POA: Diagnosis not present

## 2023-10-09 DIAGNOSIS — D631 Anemia in chronic kidney disease: Secondary | ICD-10-CM | POA: Diagnosis not present

## 2023-10-09 DIAGNOSIS — N2581 Secondary hyperparathyroidism of renal origin: Secondary | ICD-10-CM | POA: Diagnosis not present

## 2023-10-09 DIAGNOSIS — I129 Hypertensive chronic kidney disease with stage 1 through stage 4 chronic kidney disease, or unspecified chronic kidney disease: Secondary | ICD-10-CM | POA: Diagnosis not present

## 2023-10-09 DIAGNOSIS — I10 Essential (primary) hypertension: Secondary | ICD-10-CM | POA: Diagnosis not present

## 2023-10-09 DIAGNOSIS — E875 Hyperkalemia: Secondary | ICD-10-CM | POA: Diagnosis not present

## 2023-10-09 DIAGNOSIS — R768 Other specified abnormal immunological findings in serum: Secondary | ICD-10-CM | POA: Diagnosis not present

## 2023-10-09 DIAGNOSIS — N184 Chronic kidney disease, stage 4 (severe): Secondary | ICD-10-CM | POA: Diagnosis not present

## 2023-10-14 ENCOUNTER — Encounter: Payer: Self-pay | Admitting: Cardiology

## 2023-10-14 ENCOUNTER — Encounter: Payer: Self-pay | Admitting: Internal Medicine

## 2023-10-14 ENCOUNTER — Ambulatory Visit: Payer: PPO | Attending: Cardiology | Admitting: Cardiology

## 2023-10-14 VITALS — BP 114/62 | HR 73 | Ht 68.0 in | Wt 244.6 lb

## 2023-10-14 DIAGNOSIS — I10 Essential (primary) hypertension: Secondary | ICD-10-CM | POA: Diagnosis not present

## 2023-10-14 DIAGNOSIS — G4733 Obstructive sleep apnea (adult) (pediatric): Secondary | ICD-10-CM

## 2023-10-14 NOTE — Progress Notes (Addendum)
 SLEEP MEDICINE CONSULT NOTE  Date:  10/14/2023   ID:  [name], DOB Dec 09, 1955, MRN [id number]  PCP:  Cleatus Arlyss GORMAN, MD  Cardiologist:  Evalene Lunger, MD  Sleep Medicine:  Wilbert Bihari, MD Electrophysiologist:  Elspeth Sage, MD   Chief Complaint:  OSA  Referral: This is a 68 year old female with a history obstructive sleep apnea on CPAP was lost to follow-up with sleep medicine and is now referred by Toribio Fuel back to reestablish sleep medicine care.  She has not been seen since 2020.  History of Present Illness:    [name] is a 68 y.o. female  with a history of ASCAD, CKD, chronic systolic CHF and Moderate OSA with AHI 23/hr titrated to CPAP at 16cm H2O.  PSG showed moderate OSA with an overall AHI of 23 events per hour and 87 events per hour during REM sleep.  She had reduced sleep efficiency with increased frequency of arousals from respiratory events and moderate snoring.  She underwent CPAP titration to 16cm H2O.   I saw her 01/2019 and she had not been using her PAP for 2 years.  We ordered a new mask and restarted her on auto PAP.  She was seen back 04/2019 and was having problems with her mask which was a nasal mask and she breaths through her mouth.  She was changed to a Respironics Dreamwear nasal mask and placed on auto PAP with overnight pulse ox which showed no nocturnal hypoxemia.  I have not seen her since 2020 and Dr. Fuel has referred her back for sleep medicine consultation to reestablish sleep care  She is doing well with her PAP device and thinks that she has gotten used to it.  She tolerates the nasal cushion mask and feels the pressure is adequate.  Since going on PAP she feels rested in the am and has no significant daytime sleepiness but does occasionally have to take a nap.  She denies any significant mouth or nasal dryness or nasal congestion.  She does not think that she snores.    Prior CV studies:   The following studies were  reviewed today:  PAP compliance download  Past Medical History:  Diagnosis Date   Acute myocardial infarction, subendocardial infarction, initial episode of care (HCC) 07/21/2012   Acute osteomyelitis involving ankle and foot (HCC) 02/06/2015   Acute systolic heart failure (HCC) 07/21/2012   New onset 07/19/12; admission to [facility] ED. Elevated Troponins.  S/p 2D-echo with EF 20-25%.  S/p cardiac catheterization with stenting LAD.  Repeat 2D-echo 10/2011 with improved EF of 35%.    Anemia    Arthritis    Automatic implantable cardioverter-defibrillator in situ    a. MDT CRT-D 06/2014, SN: [id number]  .removed in feb 2020   CAD (coronary artery disease)    a. cardiac cath 101/04/2012: PCI/DES to chronically occluded mLAD, consideration PCI to diag branch in 4 weeks.    Cataract    Chicken pox    Chronic kidney disease    Chronic systolic CHF (congestive heart failure) (HCC)    a. mixed ICM & NICM; b. EF 20-25% by echo 07/2012, mid-dist 2/3 of LV sev HK/AK, mild MR. echo 10/2012: EF 30-35%, sev HK ant-septal & inf walls, GR1DD, mild MR, PASP 33. c. echo 02/2013: EF 30%, GR1DD, mild MR. echo 04/2014: EF 30%, Septal-lat dyssynchrony, global HK, inf AK, GR1DD, mild MR. d. echo 10/2014: EF50-55%, WM nl, GR1DD, septal mild paradox. e. echo 02/2015: EF 50-55%,  wm    Depression    Heart attack (HCC)    Heel ulcer (HCC) 04/27/2015   History of blood transfusion ~ 2011   plasma; had neuropathy; couldn't walk   Hypertension    Hypothyroidism    LBBB (left bundle branch block)    Neuromuscular disorder (HCC)    Neuropathy 2011   Obesity, unspecified    OSA on CPAP    Moderate with AHI 23/hr and now on CPAP at 16cm H2O.  Her DME is [facility]   Pure hypercholesterolemia    Sepsis (HCC) 09/2018   Type II diabetes mellitus (HCC)    Unspecified vitamin D  deficiency    Past Surgical History:  Procedure Laterality Date   ABDOMINAL AORTOGRAM W/LOWER EXTREMITY N/A 02/02/2021   Procedure: ABDOMINAL  AORTOGRAM W/LOWER EXTREMITY;  Surgeon: Eliza Lonni RAMAN, MD;  Location: [facility] INVASIVE CV LAB;  Service: Cardiovascular;  Laterality: N/A;   AMPUTATION Left 03/02/2021   Procedure: AMPUTATION RAY, left great toe;  Surgeon: Gershon Donnice SAUNDERS, DPM;  Location: [facility] OR;  Service: Podiatry;  Laterality: Left;   AMPUTATION TOE Right 06/18/2015   Procedure: AMPUTATION TOE;  Surgeon: Eva Gay, DPM;  Location: [facility] ORS;  Service: Podiatry;  Laterality: Right;   AMPUTATION TOE Left 10/08/2018   Procedure: AMPUTATION TOE LEFT 2ND;  Surgeon: Lilli Donnice, DPM;  Location: [facility] ORS;  Service: Podiatry;  Laterality: Left;   BACK SURGERY     BI-VENTRICULAR IMPLANTABLE CARDIOVERTER DEFIBRILLATOR N/A 07/06/2014   Procedure: BI-VENTRICULAR IMPLANTABLE CARDIOVERTER DEFIBRILLATOR  (CRT-D);  Surgeon: Elspeth JAYSON Sage, MD;  Location: [facility] CATH LAB;  Service: Cardiovascular;  Laterality: N/A;   BI-VENTRICULAR IMPLANTABLE CARDIOVERTER DEFIBRILLATOR  (CRT-D)  07/06/2014   BILATERAL OOPHORECTOMY  01/2011   ovarian cyst benign   BIV ICD INSERTION CRT-D N/A 03/31/2019   Procedure: BIV ICD INSERTION CRT-D;  Surgeon: Inocencio Soyla Lunger, MD;  Location: [facility] INVASIVE CV LAB;  Service: Cardiovascular;  Laterality: N/A;   COLONOSCOPY WITH PROPOFOL  Left 02/22/2015   Procedure: COLONOSCOPY WITH PROPOFOL ;  Surgeon: Deward CINDERELLA Piedmont, MD;  Location: [facility] ENDOSCOPY;  Service: Endoscopy;  Laterality: Left;   CORONARY ANGIOPLASTY WITH STENT PLACEMENT Left 07/2012   new onset systolic CHF; elevated troponins.  Cardiac catheterization with stenting to LAD; EF 15%.  2D-echo: EF 20-25%.   ESOPHAGOGASTRODUODENOSCOPY N/A 02/22/2015   Procedure: ESOPHAGOGASTRODUODENOSCOPY (EGD);  Surgeon: Deward CINDERELLA Piedmont, MD;  Location: [facility] ENDOSCOPY;  Service: Endoscopy;  Laterality: N/A;   I & D EXTREMITY Left 11/28/2020   Procedure: IRRIGATION AND DEBRIDEMENT OF FOOT;  Surgeon: Gershon Donnice SAUNDERS, DPM;  Location: [facility] OR;  Service: Podiatry;  Laterality: Left;   I & D  EXTREMITY Left 01/31/2021   Procedure: IRRIGATION AND DEBRIDEMENT EXTREMITY OF LEFT FOOT WITH BONE BIOPSY OF  1ST METATARSAL AND FIFTH METATARSAL;  Surgeon: Tobie Franky SQUIBB, DPM;  Location: [facility] OR;  Service: Podiatry;  Laterality: Left;   I & D EXTREMITY Left 02/03/2021   Procedure: IRRIGATION AND DEBRIDEMENT EXTREMITY WITH INTERNAL AMPUTATION FIRST AND FIFTH RAY;  Surgeon: Tobie Franky SQUIBB, DPM;  Location: [facility] OR;  Service: Podiatry;  Laterality: Left;   INCISION AND DRAINAGE ABSCESS Right 2007   groin; with ICU stay due to sepsis.   IR FLUORO GUIDE CV LINE RIGHT  03/06/2021   IR REMOVAL TUN CV CATH W/O FL  04/27/2021   IR US  GUIDE VASC ACCESS RIGHT  03/06/2021   IRRIGATION AND DEBRIDEMENT FOOT Left 04/25/2021   Procedure: EXCISION AND DEBRIDEMENT WOUND ULCER LEFT FOOT; METATARSECTOMY;  Surgeon: Gershon Donnice SAUNDERS, DPM;  Location: [facility] ORS;  Service: Podiatry;  Laterality: Left;   LAPAROSCOPIC CHOLECYSTECTOMY  2011   LEFT HEART CATH AND CORONARY ANGIOGRAPHY N/A 10/05/2018   Procedure: LEFT HEART CATH AND CORONARY ANGIOGRAPHY;  Surgeon: Perla Evalene PARAS, MD;  Location: [facility] INVASIVE CV LAB;  Service: Cardiovascular;  Laterality: N/A;   LEFT HEART CATHETERIZATION WITH CORONARY ANGIOGRAM N/A 07/21/2012   Procedure: LEFT HEART CATHETERIZATION WITH CORONARY ANGIOGRAM;  Surgeon: Toribio SAUNDERS Fuel, MD;  Location: [facility] CATH LAB;  Service: Cardiovascular;  Laterality: N/A;   LUMBAR DISC SURGERY  1996   L4-5   PACEMAKER REMOVAL  11/2018   due to infection around pacemaker   PERCUTANEOUS CORONARY STENT INTERVENTION (PCI-S) N/A 07/23/2012   Procedure: PERCUTANEOUS CORONARY STENT INTERVENTION (PCI-S);  Surgeon: Ozell Fell, MD;  Location: [facility] CATH LAB;  Service: Cardiovascular;  Laterality: N/A;   PERIPHERAL VASCULAR BALLOON ANGIOPLASTY Left 10/06/2018   Procedure: PERIPHERAL VASCULAR BALLOON ANGIOPLASTY;  Surgeon: Jama Cordella MATSU, MD;  Location: [facility] INVASIVE CV LAB;  Service: Cardiovascular;  Laterality: Left;    PERIPHERAL VASCULAR CATHETERIZATION N/A 02/10/2015   Procedure: Picc Line Insertion;  Surgeon: Cordella MATSU Jama, MD;  Location: [facility] INVASIVE CV LAB;  Service: Cardiovascular;  Laterality: N/A;   RIGHT HEART CATH N/A 03/29/2019   Procedure: RIGHT HEART CATH;  Surgeon: Fuel Toribio SAUNDERS, MD;  Location: [facility] INVASIVE CV LAB;  Service: Cardiovascular;  Laterality: N/A;   TEE WITHOUT CARDIOVERSION N/A 10/02/2018   Procedure: TRANSESOPHAGEAL ECHOCARDIOGRAM (TEE);  Surgeon: Darron Deatrice LABOR, MD;  Location: [facility] ORS;  Service: Cardiovascular;  Laterality: N/A;   TOTAL KNEE ARTHROPLASTY Right 05/07/2022   Procedure: TOTAL KNEE ARTHROPLASTY;  Surgeon: Ernie Donnice, MD;  Location: [facility] ORS;  Service: Orthopedics;  Laterality: Right;   TUBAL LIGATION  1981   VAGINAL HYSTERECTOMY  01/2011   Fibroids/DUB.  Ovaries removed. [place].     Current Meds  Medication Sig   acetaminophen  (TYLENOL ) 500 MG tablet Take 2 tablets (1,000 mg total) by mouth every 6 (six) hours as needed for moderate pain or headache.   amiodarone  (PACERONE ) 200 MG tablet Take 0.5 tablets (100 mg total) by mouth every other day.   atorvastatin  (LIPITOR) 10 MG tablet TAKE ONE TABLET BY MOUTH ONCE DAILY. NEED OFFICE VISIT FOR MORE REFILLS   carvedilol  (COREG ) 3.125 MG tablet Take 1 tablet (3.125 mg total) by mouth 2 (two) times daily.   clopidogrel  (PLAVIX ) 75 MG tablet TAKE ONE TABLET BY MOUTH ONCE A DAY   Continuous Blood Gluc Receiver (FREESTYLE LIBRE 2 READER) DEVI Use to check sugar multiple times per day as directed.  H/o DM2, treated with insulin .   Continuous Blood Gluc Receiver (FREESTYLE LIBRE 2 READER) DEVI FreeStyle Libre 2 Reader   Continuous Glucose Sensor (FREESTYLE LIBRE 2 SENSOR) MISC USE TO CHECK BLOOD SUGARS MULTIPLE TIMES DAILY AS DIRECTED DX:E11.9   dapagliflozin propanediol (FARXIGA) 10 MG TABS tablet Take by mouth.   DULoxetine  (CYMBALTA ) 30 MG capsule TAKE ONE CAPSULE BY MOUTH EVERY NIGHT AT BEDTIME   gabapentin   (NEURONTIN ) 600 MG tablet Take 600 mg by mouth at bedtime.   insulin  aspart (NOVOLOG  FLEXPEN) 100 UNIT/ML FlexPen INJECT 10 UNITS WITH BREAKFAST AND 5 UNITS WITH SUPPER   insulin  glargine (LANTUS  SOLOSTAR) 100 UNIT/ML Solostar Pen 8 units daily in PM   Insulin  Pen Needle (PEN NEEDLES) 32G X 6 MM MISC 1 each by Does not apply route 4 (four) times daily.   Levothyroxine  Sodium 37.5 MCG CAPS Take  1 tablet by mouth daily.   Multiple Vitamin (MULTIVITAMIN WITH MINERALS) TABS tablet Take 1 tablet by mouth every morning.   nitroGLYCERIN  (NITROSTAT ) 0.4 MG SL tablet Place 1 tablet (0.4 mg total) under the tongue every 5 (five) minutes as needed for chest pain.   Omega-3 Fatty Acids (FISH OIL) 1000 MG CAPS Take 1,000 mg by mouth 2 (two) times a day.   ONETOUCH ULTRA test strip USE AS INSTRUCTED TO CHECK BLOOD SUGAR 3TIMES DAILY   polyethylene glycol (MIRALAX  / GLYCOLAX ) 17 g packet Take 17 g by mouth daily as needed for mild constipation.   Semaglutide ,0.25 or 0.5MG /DOS, (OZEMPIC , 0.25 OR 0.5 MG/DOSE,) 2 MG/3ML SOPN Inject 0.5 mg as directed per week   torsemide  (DEMADEX ) 20 MG tablet TAKE ONE TABLET BY MOUTH TWICE A DAY   traMADol  (ULTRAM ) 50 MG tablet TAKE ONE (1) TO TWO (2) TABLETS (50-100 MG TOTAL) BY MOUTH AT BEDTIME AS NEEDED.     Allergies:   Heparin  and Nsaids   Social History   Tobacco Use   Smoking status: Never   Smokeless tobacco: Never  Vaping Use   Vaping status: Never Used  Substance Use Topics   Alcohol  use: No    Alcohol /week: 0.0 standard drinks of alcohol    Drug use: No     Family Hx: The patient's family history includes Arthritis in her mother; Breast cancer in an other family member; COPD in her maternal grandmother; Cancer in her father and maternal grandfather; Cancer (age of onset: 42) in her brother; Colon cancer in an other family member; Diabetes in her maternal grandmother, mother, paternal grandfather, paternal grandmother, and son; Heart disease in her maternal  grandmother; Hypertension in her mother and son; Obesity in her brother.  ROS:   Please see the history of present illness.     All other systems reviewed and are negative.   Labs/Other Tests and Data Reviewed:    Recent Labs: 09/09/2023: ALT 10; BUN 29; Creatinine, Ser 2.33; Potassium 4.6; Sodium 143; TSH 0.91   Recent Lipid Panel Lab Results  Component Value Date/Time   CHOL 138 09/09/2023 01:36 PM   CHOL 128 09/26/2020 12:16 PM   TRIG 167.0 (H) 09/09/2023 01:36 PM   HDL 35.50 (L) 09/09/2023 01:36 PM   HDL 44 09/26/2020 12:16 PM   CHOLHDL 4 09/09/2023 01:36 PM   LDLCALC 69 09/09/2023 01:36 PM   LDLCALC 60 09/26/2020 12:16 PM    Wt Readings from Last 3 Encounters:  10/14/23 244 lb 9.6 oz (110.9 kg)  09/16/23 251 lb 6.4 oz (114 kg)  09/09/23 254 lb 6 oz (115.4 kg)     Objective:    Vital Signs:  BP 114/62   Pulse 73   Ht 5' 8 (1.727 m)   Wt 244 lb 9.6 oz (110.9 kg)   SpO2 98%   BMI 37.19 kg/m    GEN: Well nourished, well developed in no acute distress HEENT: Normal NECK: No JVD; No carotid bruits LYMPHATICS: No lymphadenopathy CARDIAC:RRR, no murmurs, rubs, gallops RESPIRATORY:  Clear to auscultation without rales, wheezing or rhonchi  ABDOMEN: Soft, non-tender, non-distended MUSCULOSKELETAL:  No edema; No deformity  SKIN: Warm and dry NEUROLOGIC:  Alert and oriented x 3 PSYCHIATRIC:  Normal affect   ASSESSMENT & PLAN:    OSA - The patient is tolerating PAP therapy well without any problems. The PAP download performed by his DME was personally reviewed and interpreted by me today and showed an AHI of 1.5/hr on 12 cm  H2O with 100% compliance in using more than 4 hours nightly.  The patient has been using and benefiting from PAP use and will continue to benefit from therapy.  -her device is 70 years old and no longer will send a signal for downloads so I will  order her a new ResMed CPAP at 12cm H2O with heated humidity and mask of choice  HTN -BP adequately  controlled on exam today -Continue prescription drug management with carvedilol  3.125 mg twice daily with as needed refills   Medication Adjustments/Labs and Tests Ordered: Current medicines are reviewed at length with the patient today.  Concerns regarding medicines are outlined above.  Tests Ordered: No orders of the defined types were placed in this encounter.  Medication Changes: No orders of the defined types were placed in this encounter.   Disposition:  Follow up 6 weeks after getting new device  Signed, Wilbert Bihari, MD  10/14/2023 2:22 PM    [facility]

## 2023-10-14 NOTE — Patient Instructions (Signed)
 Medication Instructions:  Your physician recommends that you continue on your current medications as directed. Please refer to the Current Medication list given to you today.  *If you need a refill on your cardiac medications before your next appointment, please call your pharmacy*   Lab Work: None.  If you have labs (blood work) drawn today and your tests are completely normal, you will receive your results only by: MyChart Message (if you have MyChart) OR A paper copy in the mail If you have any lab test that is abnormal or we need to change your treatment, we will call you to review the results.   Testing/Procedures: None.   Follow-Up: At [facility], you and your health needs are our priority.  As part of our continuing mission to provide you with exceptional heart care, we have created designated Provider Care Teams.  These Care Teams include your primary Cardiologist (physician) and Advanced Practice Providers (APPs -  Physician Assistants and Nurse Practitioners) who all work together to provide you with the care you need, when you need it.  We recommend signing up for the patient portal called MyChart.  Sign up information is provided on this After Visit Summary.  MyChart is used to connect with patients for Virtual Visits (Telemedicine).  Patients are able to view lab/test results, encounter notes, upcoming appointments, etc.  Non-urgent messages can be sent to your provider as well.   To learn more about what you can do with MyChart, go to [email or web address].    Your next appointment will be dependent upon set up of your new cpap machine and it will be with:     Provider:   Dr. Wilbert Bihari, MD

## 2023-10-16 ENCOUNTER — Encounter: Payer: Self-pay | Admitting: Internal Medicine

## 2023-10-16 ENCOUNTER — Ambulatory Visit: Payer: PPO | Attending: Internal Medicine | Admitting: Internal Medicine

## 2023-10-16 ENCOUNTER — Telehealth: Payer: Self-pay | Admitting: *Deleted

## 2023-10-16 VITALS — BP 127/73 | HR 86 | Ht 68.0 in | Wt 252.0 lb

## 2023-10-16 DIAGNOSIS — I5022 Chronic systolic (congestive) heart failure: Secondary | ICD-10-CM | POA: Diagnosis not present

## 2023-10-16 DIAGNOSIS — E875 Hyperkalemia: Secondary | ICD-10-CM | POA: Diagnosis not present

## 2023-10-16 DIAGNOSIS — R768 Other specified abnormal immunological findings in serum: Secondary | ICD-10-CM | POA: Diagnosis not present

## 2023-10-16 DIAGNOSIS — R809 Proteinuria, unspecified: Secondary | ICD-10-CM | POA: Diagnosis not present

## 2023-10-16 DIAGNOSIS — I951 Orthostatic hypotension: Secondary | ICD-10-CM

## 2023-10-16 DIAGNOSIS — G4733 Obstructive sleep apnea (adult) (pediatric): Secondary | ICD-10-CM

## 2023-10-16 DIAGNOSIS — Z95810 Presence of automatic (implantable) cardiac defibrillator: Secondary | ICD-10-CM

## 2023-10-16 DIAGNOSIS — N184 Chronic kidney disease, stage 4 (severe): Secondary | ICD-10-CM | POA: Diagnosis not present

## 2023-10-16 DIAGNOSIS — I129 Hypertensive chronic kidney disease with stage 1 through stage 4 chronic kidney disease, or unspecified chronic kidney disease: Secondary | ICD-10-CM | POA: Diagnosis not present

## 2023-10-16 DIAGNOSIS — I428 Other cardiomyopathies: Secondary | ICD-10-CM | POA: Diagnosis not present

## 2023-10-16 DIAGNOSIS — N2581 Secondary hyperparathyroidism of renal origin: Secondary | ICD-10-CM | POA: Diagnosis not present

## 2023-10-16 DIAGNOSIS — E1122 Type 2 diabetes mellitus with diabetic chronic kidney disease: Secondary | ICD-10-CM | POA: Diagnosis not present

## 2023-10-16 DIAGNOSIS — I10 Essential (primary) hypertension: Secondary | ICD-10-CM

## 2023-10-16 DIAGNOSIS — D631 Anemia in chronic kidney disease: Secondary | ICD-10-CM | POA: Diagnosis not present

## 2023-10-16 DIAGNOSIS — I509 Heart failure, unspecified: Secondary | ICD-10-CM | POA: Diagnosis not present

## 2023-10-16 LAB — PACEMAKER DEVICE OBSERVATION

## 2023-10-16 NOTE — Patient Instructions (Signed)
 Medication Instructions:  Your physician recommends that you continue on your current medications as directed. Please refer to the Current Medication list given to you today.  *If you need a refill on your cardiac medications before your next appointment, please call your pharmacy*   Lab Work: None ordered.  If you have labs (blood work) drawn today and your tests are completely normal, you will receive your results only by: MyChart Message (if you have MyChart) OR A paper copy in the mail If you have any lab test that is abnormal or we need to change your treatment, we will call you to review the results.   Testing/Procedures: None ordered.    Follow-Up: At [facility], you and your health needs are our priority.  As part of our continuing mission to provide you with exceptional heart care, we have created designated Provider Care Teams.  These Care Teams include your primary Cardiologist (physician) and Advanced Practice Providers (APPs -  Physician Assistants and Nurse Practitioners) who all work together to provide you with the care you need, when you need it.  We recommend signing up for the patient portal called MyChart.  Sign up information is provided on this After Visit Summary.  MyChart is used to connect with patients for Virtual Visits (Telemedicine).  Patients are able to view lab/test results, encounter notes, upcoming appointments, etc.  Non-urgent messages can be sent to your provider as well.   To learn more about what you can do with MyChart, go to [email or web address].    Your next appointment:   10 weeks with Dr Fernande

## 2023-10-16 NOTE — Telephone Encounter (Signed)
-----   Message from Armanda Magic sent at 10/14/2023  2:24 PM EST ----- order her a new ResMed CPAP at 12cm H2O with heated humidity and mask of choice

## 2023-10-16 NOTE — Progress Notes (Signed)
 Patient ID: [name], female   DOB: [birth year], 68 y.o.   MRN: [id number]      Patient Care Team: Cleatus Arlyss GORMAN, MD as PCP - General (Family Medicine) Perla Evalene PARAS, MD as PCP - Cardiology (Cardiology) Fernande Elspeth BROCKS, MD as PCP - Electrophysiology (Cardiology) Bensimhon, Toribio SAUNDERS, MD as PCP - Advanced Heart Failure (Cardiology) Douglas Rule, MD as Consulting Physician (Internal Medicine) Lilli, Donnice MAUL (Inactive) as Attending Physician (Podiatry) Fayette Bodily, MD as Consulting Physician (Infectious Diseases) Tobie Tonita POUR, DO as Consulting Physician (Neurology) Fate Morna SAILOR, [facility] (Inactive) as Pharmacist (Pharmacist) Babara Call, MD as Consulting Physician (Oncology) Pa, [facility] Od   HPI  [name] is a 68 y.o. female CRT-D implanted for nonischemic cardiomyopathy with concomitant coronary disease.. Developed recurrent MSSA bacteremia and underwent system extraction 3/20 with aspiration of the thrombus of the lead.  Recurrent heart failure and interval worsening of LV function prompted decision to reimplant done [facility]) 6/20  Following reimplantation LV function has improved considerably (see below)  History of atrial tachycardia and PVCs  for which she has been treated with low dose amiodarone . Patient denies symptoms of respiratory, GI intolerance, sun sensitivity, neurological symptoms attributable to amiodarone .      Lightheadedness is better.  Shortness of breath improved.  No edema chest pain  Interval  development of diaphragmatic stimulation.  Last chest x-ray 1/23 compared with post implant without significant change.  DATE TEST EF%    9/15  echo  30 %    1 /17 echo  50 %    12/19 Echo  45-50%    12/19 TEE   No vegetations  12/19 LHC   pLAD80 with ISR; mLAD70 pCx 80;D1 90 mRCA 40  2/20 CT     2/20 TEE    3/20  (2) Echo  40%   6/20 Echo  10-15%   12/20 Echo 35%   6/21 Echo  50-55%   2/23 Echo  50-55%    11/24 Echo  55%     Date Cr K Hgb TSH LFTs   LDL  3/20 3.54>>1.18  11 4.64  18   7/20 2.15 4.9 9.7 (6/20)17.4    18   9/20  2.02 4.3  21.4  55  11/20 2.17 4.7 11.2 6.17 23   1/21 2.44 4.9      12/21 2.09 5.1 11.6     6/22  (3/22)2.48 1.58 (3/22)4.7 4.2 (3/22)13.0 10.8     2/23 2.15<<1.87   1.12  43  8/23 2.08 5.1 9.4  15   9/24 2.15 4.7 12.5       Past Medical History:  Diagnosis Date   Acute myocardial infarction, subendocardial infarction, initial episode of care (HCC) 07/21/2012   Acute osteomyelitis involving ankle and foot (HCC) 02/06/2015   Acute systolic heart failure (HCC) 07/21/2012   New onset 07/19/12; admission [facility] ED. Elevated Troponins.  S/p 2D-echo with EF 20-25%.  S/p cardiac catheterization with stenting LAD.  Repeat 2D-echo 10/2011 with improved EF of 35%.    Anemia    Arthritis    Automatic implantable cardioverter-defibrillator in situ    a. MDT CRT-D 06/2014, SN: [id number]  .removed in feb 2020   CAD (coronary artery disease)    a. cardiac cath 101/04/2012: PCI/DES to chronically occluded mLAD, consideration PCI to diag branch in 4 weeks.    Cataract    Chicken pox    Chronic kidney disease    Chronic  systolic CHF (congestive heart failure) (HCC)    a. mixed ICM & NICM; b. EF 20-25% by echo 07/2012, mid-dist 2/3 of LV sev HK/AK, mild MR. echo 10/2012: EF 30-35%, sev HK ant-septal & inf walls, GR1DD, mild MR, PASP 33. c. echo 02/2013: EF 30%, GR1DD, mild MR. echo 04/2014: EF 30%, Septal-lat dyssynchrony, global HK, inf AK, GR1DD, mild MR. d. echo 10/2014: EF50-55%, WM nl, GR1DD, septal mild paradox. e. echo 02/2015: EF 50-55%, wm    Depression    Heart attack (HCC)    Heel ulcer (HCC) 04/27/2015   History of blood transfusion ~ 2011   plasma; had neuropathy; couldn't walk   Hypertension    Hypothyroidism    LBBB (left bundle branch block)    Neuromuscular disorder (HCC)    Neuropathy 2011   Obesity, unspecified    OSA on CPAP    Moderate with  AHI 23/hr and now on CPAP at 16cm H2O.  Her DME is [facility]   Pure hypercholesterolemia    Sepsis (HCC) 09/2018   Type II diabetes mellitus (HCC)    Unspecified vitamin D  deficiency    Past Surgical History:  Procedure Laterality Date   ABDOMINAL AORTOGRAM W/LOWER EXTREMITY N/A 02/02/2021   Procedure: ABDOMINAL AORTOGRAM W/LOWER EXTREMITY;  Surgeon: Eliza Lonni RAMAN, MD;  Location: [facility] INVASIVE CV LAB;  Service: Cardiovascular;  Laterality: N/A;   AMPUTATION Left 03/02/2021   Procedure: AMPUTATION RAY, left great toe;  Surgeon: Gershon Donnice SAUNDERS, DPM;  Location: [facility] OR;  Service: Podiatry;  Laterality: Left;   AMPUTATION TOE Right 06/18/2015   Procedure: AMPUTATION TOE;  Surgeon: Eva Gay, DPM;  Location: [facility] ORS;  Service: Podiatry;  Laterality: Right;   AMPUTATION TOE Left 10/08/2018   Procedure: AMPUTATION TOE LEFT 2ND;  Surgeon: Lilli Donnice, DPM;  Location: [facility] ORS;  Service: Podiatry;  Laterality: Left;   BACK SURGERY     BI-VENTRICULAR IMPLANTABLE CARDIOVERTER DEFIBRILLATOR N/A 07/06/2014   Procedure: BI-VENTRICULAR IMPLANTABLE CARDIOVERTER DEFIBRILLATOR  (CRT-D);  Surgeon: Elspeth JAYSON Sage, MD;  Location: [facility] CATH LAB;  Service: Cardiovascular;  Laterality: N/A;   BI-VENTRICULAR IMPLANTABLE CARDIOVERTER DEFIBRILLATOR  (CRT-D)  07/06/2014   BILATERAL OOPHORECTOMY  01/2011   ovarian cyst benign   BIV ICD INSERTION CRT-D N/A 03/31/2019   Procedure: BIV ICD INSERTION CRT-D;  Surgeon: Inocencio Soyla Lunger, MD;  Location: [facility] INVASIVE CV LAB;  Service: Cardiovascular;  Laterality: N/A;   COLONOSCOPY WITH PROPOFOL  Left 02/22/2015   Procedure: COLONOSCOPY WITH PROPOFOL ;  Surgeon: Deward CINDERELLA Piedmont, MD;  Location: [facility] ENDOSCOPY;  Service: Endoscopy;  Laterality: Left;   CORONARY ANGIOPLASTY WITH STENT PLACEMENT Left 07/2012   new onset systolic CHF; elevated troponins.  Cardiac catheterization with stenting to LAD; EF 15%.  2D-echo: EF 20-25%.   ESOPHAGOGASTRODUODENOSCOPY N/A 02/22/2015    Procedure: ESOPHAGOGASTRODUODENOSCOPY (EGD);  Surgeon: Deward CINDERELLA Piedmont, MD;  Location: [facility] ENDOSCOPY;  Service: Endoscopy;  Laterality: N/A;   I & D EXTREMITY Left 11/28/2020   Procedure: IRRIGATION AND DEBRIDEMENT OF FOOT;  Surgeon: Gershon Donnice SAUNDERS, DPM;  Location: [facility] OR;  Service: Podiatry;  Laterality: Left;   I & D EXTREMITY Left 01/31/2021   Procedure: IRRIGATION AND DEBRIDEMENT EXTREMITY OF LEFT FOOT WITH BONE BIOPSY OF  1ST METATARSAL AND FIFTH METATARSAL;  Surgeon: Tobie Franky SQUIBB, DPM;  Location: [facility] OR;  Service: Podiatry;  Laterality: Left;   I & D EXTREMITY Left 02/03/2021   Procedure: IRRIGATION AND DEBRIDEMENT EXTREMITY WITH INTERNAL AMPUTATION FIRST AND FIFTH RAY;  Surgeon: Tobie Franky  P, DPM;  Location: [facility] OR;  Service: Podiatry;  Laterality: Left;   INCISION AND DRAINAGE ABSCESS Right 2007   groin; with ICU stay due to sepsis.   IR FLUORO GUIDE CV LINE RIGHT  03/06/2021   IR REMOVAL TUN CV CATH W/O FL  04/27/2021   IR US  GUIDE VASC ACCESS RIGHT  03/06/2021   IRRIGATION AND DEBRIDEMENT FOOT Left 04/25/2021   Procedure: EXCISION AND DEBRIDEMENT WOUND ULCER LEFT FOOT; METATARSECTOMY;  Surgeon: Gershon Donnice SAUNDERS, DPM;  Location: [facility] ORS;  Service: Podiatry;  Laterality: Left;   LAPAROSCOPIC CHOLECYSTECTOMY  2011   LEFT HEART CATH AND CORONARY ANGIOGRAPHY N/A 10/05/2018   Procedure: LEFT HEART CATH AND CORONARY ANGIOGRAPHY;  Surgeon: Perla Evalene PARAS, MD;  Location: [facility] INVASIVE CV LAB;  Service: Cardiovascular;  Laterality: N/A;   LEFT HEART CATHETERIZATION WITH CORONARY ANGIOGRAM N/A 07/21/2012   Procedure: LEFT HEART CATHETERIZATION WITH CORONARY ANGIOGRAM;  Surgeon: Toribio SAUNDERS Fuel, MD;  Location: [facility] CATH LAB;  Service: Cardiovascular;  Laterality: N/A;   LUMBAR DISC SURGERY  1996   L4-5   PACEMAKER REMOVAL  11/2018   due to infection around pacemaker   PERCUTANEOUS CORONARY STENT INTERVENTION (PCI-S) N/A 07/23/2012   Procedure: PERCUTANEOUS CORONARY STENT INTERVENTION (PCI-S);   Surgeon: Ozell Fell, MD;  Location: [facility] CATH LAB;  Service: Cardiovascular;  Laterality: N/A;   PERIPHERAL VASCULAR BALLOON ANGIOPLASTY Left 10/06/2018   Procedure: PERIPHERAL VASCULAR BALLOON ANGIOPLASTY;  Surgeon: Jama Cordella MATSU, MD;  Location: [facility] INVASIVE CV LAB;  Service: Cardiovascular;  Laterality: Left;   PERIPHERAL VASCULAR CATHETERIZATION N/A 02/10/2015   Procedure: Picc Line Insertion;  Surgeon: Cordella MATSU Jama, MD;  Location: [facility] INVASIVE CV LAB;  Service: Cardiovascular;  Laterality: N/A;   RIGHT HEART CATH N/A 03/29/2019   Procedure: RIGHT HEART CATH;  Surgeon: Fuel Toribio SAUNDERS, MD;  Location: [facility] INVASIVE CV LAB;  Service: Cardiovascular;  Laterality: N/A;   TEE WITHOUT CARDIOVERSION N/A 10/02/2018   Procedure: TRANSESOPHAGEAL ECHOCARDIOGRAM (TEE);  Surgeon: Darron Deatrice LABOR, MD;  Location: [facility] ORS;  Service: Cardiovascular;  Laterality: N/A;   TOTAL KNEE ARTHROPLASTY Right 05/07/2022   Procedure: TOTAL KNEE ARTHROPLASTY;  Surgeon: Ernie Donnice, MD;  Location: [facility] ORS;  Service: Orthopedics;  Laterality: Right;   TUBAL LIGATION  1981   VAGINAL HYSTERECTOMY  01/2011   Fibroids/DUB.  Ovaries removed. [place].   Current Meds  Medication Sig   acetaminophen  (TYLENOL ) 500 MG tablet Take 2 tablets (1,000 mg total) by mouth every 6 (six) hours as needed for moderate pain or headache.   amiodarone  (PACERONE ) 200 MG tablet Take 0.5 tablets (100 mg total) by mouth every other day.   atorvastatin  (LIPITOR) 10 MG tablet TAKE ONE TABLET BY MOUTH ONCE DAILY. NEED OFFICE VISIT FOR MORE REFILLS   carvedilol  (COREG ) 3.125 MG tablet Take 1 tablet (3.125 mg total) by mouth 2 (two) times daily.   clopidogrel  (PLAVIX ) 75 MG tablet TAKE ONE TABLET BY MOUTH ONCE A DAY   Continuous Blood Gluc Receiver (FREESTYLE LIBRE 2 READER) DEVI Use to check sugar multiple times per day as directed.  H/o DM2, treated with insulin .   Continuous Blood Gluc Receiver (FREESTYLE LIBRE 2 READER) DEVI FreeStyle  Libre 2 Reader   Continuous Glucose Sensor (FREESTYLE LIBRE 2 SENSOR) MISC USE TO CHECK BLOOD SUGARS MULTIPLE TIMES DAILY AS DIRECTED DX:E11.9   dapagliflozin propanediol (FARXIGA) 10 MG TABS tablet Take by mouth.   DULoxetine  (CYMBALTA ) 30 MG capsule TAKE ONE CAPSULE BY MOUTH EVERY NIGHT AT BEDTIME  gabapentin  (NEURONTIN ) 600 MG tablet Take 600 mg by mouth at bedtime.   insulin  aspart (NOVOLOG  FLEXPEN) 100 UNIT/ML FlexPen INJECT 10 UNITS WITH BREAKFAST AND 5 UNITS WITH SUPPER   insulin  glargine (LANTUS  SOLOSTAR) 100 UNIT/ML Solostar Pen 8 units daily in PM   Insulin  Pen Needle (PEN NEEDLES) 32G X 6 MM MISC 1 each by Does not apply route 4 (four) times daily.   Levothyroxine  Sodium 37.5 MCG CAPS Take 1 tablet by mouth daily.   Multiple Vitamin (MULTIVITAMIN WITH MINERALS) TABS tablet Take 1 tablet by mouth every morning.   nitroGLYCERIN  (NITROSTAT ) 0.4 MG SL tablet Place 1 tablet (0.4 mg total) under the tongue every 5 (five) minutes as needed for chest pain.   Omega-3 Fatty Acids (FISH OIL) 1000 MG CAPS Take 1,000 mg by mouth 2 (two) times a day.   ONETOUCH ULTRA test strip USE AS INSTRUCTED TO CHECK BLOOD SUGAR 3TIMES DAILY   polyethylene glycol (MIRALAX  / GLYCOLAX ) 17 g packet Take 17 g by mouth daily as needed for mild constipation.   Semaglutide ,0.25 or 0.5MG /DOS, (OZEMPIC , 0.25 OR 0.5 MG/DOSE,) 2 MG/3ML SOPN Inject 0.5 mg as directed per week   torsemide  (DEMADEX ) 20 MG tablet TAKE ONE TABLET BY MOUTH TWICE A DAY   traMADol  (ULTRAM ) 50 MG tablet TAKE ONE (1) TO TWO (2) TABLETS (50-100 MG TOTAL) BY MOUTH AT BEDTIME AS NEEDED.   Allergies  Allergen Reactions   Heparin  Other (See Comments)    Can not remember reaction   Nsaids Other (See Comments)    Kidney disease   Review of Systems negative except from HPI and PMH  Physical Exam: BP 127/73 (BP Location: Right Arm, Patient Position: Sitting, Cuff Size: Normal)   Pulse 86   Ht 5' 8 (1.727 m)   Wt 252 lb (114.3 kg)   SpO2 97%    BMI 38.32 kg/m  Well developed and well nourished in no acute distress HENT normal Neck supple with JVP-flat Clear Device pocket well healed; without hematoma or erythema.  There is no tethering  Regular rate and rhythm, no murmur Abd-soft with active BS No Clubbing cyanosis  edema Skin-warm and dry A & Oriented  Grossly normal sensory and motor function  ECG today synchronous pacing at 86 Intervals 09/22/1950 Negative QRS lead I and upright QRS lead V1   Device function is normal. Programming changes output was reprogrammed from 2.5-3 V at 1.0 ms due to interval decrease in biventricular pacing percentage see Paceart for details    ECG sinus P-synchronous/ AV  pacing  Negative QRS lead I positive QRS lead V1 rare PVC Vector express withdrawn, the only vector in which we did not have diaphragmatic stimulation was 2--coil.  Programmed in this way.      Review of the tracing from 11/21 showed a QR in lead I and RS in lead V1  Assessment and  Plan  CRT-D recent reimplantation   Nonischemic cardiomyopathy  Coronary artery disease  Peripheral vascular disease  Orthostatic hypotension  Congestive heart failure- chronic-systolic class II  Renal insufficiency grade 3   Hypothyroidism  High Risk Medication Surveillance-Amiodarone    Atrial under sensing  PVCs  Recent amputation lower extremity digits  Diaphragmatic stimulation that he is not really an issue.  There has been a change in the LV threshold resulting in a decrease in the biventricular pacing percentage from the 80s--the 60s.  She was found on testing to have LV output programmed at threshold.  It was increased today from  2.5--3.0 V.

## 2023-10-21 NOTE — Telephone Encounter (Signed)
Per Dr Mayford Knife, Order placed to [employer] [facility] via community message.

## 2023-10-22 ENCOUNTER — Encounter: Payer: Self-pay | Admitting: Internal Medicine

## 2023-10-24 ENCOUNTER — Encounter: Payer: Self-pay | Admitting: Internal Medicine

## 2023-10-27 ENCOUNTER — Telehealth: Payer: Self-pay | Admitting: Family Medicine

## 2023-10-27 ENCOUNTER — Encounter: Payer: Self-pay | Admitting: Cardiology

## 2023-10-27 MED ORDER — NOVOLOG FLEXPEN 100 UNIT/ML ~~LOC~~ SOPN: PEN_INJECTOR | SUBCUTANEOUS | Status: DC

## 2023-10-27 NOTE — Telephone Encounter (Signed)
D/w pt about recent sugars.  Still with some nocturnal lows.  D/w pt about stopping PM short acting insulin and dropping AM dose to 8 units.  D/w pt about inc protein intake.  She can update me about her sugars in about 2 weeks, sooner if needed.

## 2023-11-13 ENCOUNTER — Ambulatory Visit (INDEPENDENT_AMBULATORY_CARE_PROVIDER_SITE_OTHER): Payer: PPO

## 2023-11-13 DIAGNOSIS — I428 Other cardiomyopathies: Secondary | ICD-10-CM | POA: Diagnosis not present

## 2023-11-13 LAB — CUP PACEART REMOTE DEVICE CHECK
Battery Remaining Longevity: 23 mo
Battery Voltage: 2.93 V
Brady Statistic AP VP Percent: 81.79 %
Brady Statistic AP VS Percent: 0.04 %
Brady Statistic AS VP Percent: 18.15 %
Brady Statistic AS VS Percent: 0.02 %
Brady Statistic RA Percent Paced: 81.4 %
Brady Statistic RV Percent Paced: 99.38 %
Date Time Interrogation Session: 20250206001807
HighPow Impedance: 69 Ohm
Implantable Lead Connection Status: 753985
Implantable Lead Connection Status: 753985
Implantable Lead Connection Status: 753985
Implantable Lead Implant Date: 20200625
Implantable Lead Implant Date: 20200625
Implantable Lead Implant Date: 20200625
Implantable Lead Location: 753858
Implantable Lead Location: 753859
Implantable Lead Location: 753860
Implantable Lead Model: 4398
Implantable Lead Model: 5076
Implantable Lead Model: 6935
Implantable Pulse Generator Implant Date: 20200625
Lead Channel Impedance Value: 1178 Ohm
Lead Channel Impedance Value: 1197 Ohm
Lead Channel Impedance Value: 195.911
Lead Channel Impedance Value: 202.667
Lead Channel Impedance Value: 213.926
Lead Channel Impedance Value: 304 Ohm
Lead Channel Impedance Value: 312.507
Lead Channel Impedance Value: 330.057
Lead Channel Impedance Value: 399 Ohm
Lead Channel Impedance Value: 418 Ohm
Lead Channel Impedance Value: 456 Ohm
Lead Channel Impedance Value: 551 Ohm
Lead Channel Impedance Value: 608 Ohm
Lead Channel Impedance Value: 722 Ohm
Lead Channel Impedance Value: 779 Ohm
Lead Channel Impedance Value: 817 Ohm
Lead Channel Impedance Value: 836 Ohm
Lead Channel Impedance Value: 988 Ohm
Lead Channel Pacing Threshold Amplitude: 0.5 V
Lead Channel Pacing Threshold Amplitude: 0.625 V
Lead Channel Pacing Threshold Amplitude: 2.75 V
Lead Channel Pacing Threshold Pulse Width: 0.4 ms
Lead Channel Pacing Threshold Pulse Width: 0.4 ms
Lead Channel Pacing Threshold Pulse Width: 1 ms
Lead Channel Sensing Intrinsic Amplitude: 1 mV
Lead Channel Sensing Intrinsic Amplitude: 1 mV
Lead Channel Sensing Intrinsic Amplitude: 20.5 mV
Lead Channel Sensing Intrinsic Amplitude: 20.5 mV
Lead Channel Setting Pacing Amplitude: 1.75 V
Lead Channel Setting Pacing Amplitude: 2 V
Lead Channel Setting Pacing Amplitude: 2.75 V
Lead Channel Setting Pacing Pulse Width: 0.4 ms
Lead Channel Setting Pacing Pulse Width: 1 ms
Lead Channel Setting Sensing Sensitivity: 0.3 mV
Zone Setting Status: 755011
Zone Setting Status: 755011

## 2023-12-08 ENCOUNTER — Encounter: Payer: Self-pay | Admitting: Internal Medicine

## 2023-12-09 ENCOUNTER — Other Ambulatory Visit: Payer: Self-pay | Admitting: Family Medicine

## 2023-12-09 ENCOUNTER — Other Ambulatory Visit: Payer: Self-pay | Admitting: Neurology

## 2023-12-18 ENCOUNTER — Telehealth: Payer: Self-pay

## 2023-12-18 MED ORDER — DULOXETINE HCL 30 MG PO CPEP: 30.0000 mg | ORAL_CAPSULE | Freq: Every day | ORAL | 0 refills | Status: DC

## 2023-12-18 NOTE — Telephone Encounter (Signed)
 Patient pharmacy requesting a refill on Cymbalta.

## 2023-12-18 NOTE — Telephone Encounter (Signed)
 Patient needs follow-up, will provide medication until she's seen.

## 2023-12-18 NOTE — Progress Notes (Signed)
 Remote ICD transmission.

## 2023-12-22 ENCOUNTER — Ambulatory Visit: Payer: Self-pay | Admitting: Family Medicine

## 2023-12-22 NOTE — Telephone Encounter (Signed)
   Chief Complaint: chest congestion Symptoms: chest congestion, nasal congestion mild cough Frequency: 2 days Pertinent Negatives: Patient denies fever Disposition: [] ED /[] Urgent Care (no appt availability in office) / [x] Appointment(In office/virtual)/ []  [facility]/ [] Home Care/ [] Refused Recommended Disposition /[]  Mobile Bus/ []  Follow-up with PCP Additional Notes: Patient states that her mom has been sick and 2 days ago she started feeling sick with little cough but mostly chest congestion and nasal congestion. Not coughing up anything but has been producing green mucus when blowing her nose.    Copied from CRM [id number]. Topic: Clinical - Red Word Triage >> Dec 22, 2023  9:46 AM Theodis Sato wrote: Red Word that prompted transfer to Nurse Triage: Discolored mucus -Green - Cough. Reason for Disposition  [1] Patient also has allergy symptoms (e.g., itchy eyes, clear nasal discharge, postnasal drip) AND [2] they are acting up  Answer Assessment - Initial Assessment Questions 1. ONSET: "When did the cough begin?"      2 days 2. SEVERITY: "How bad is the cough today?"      Not really a cough 3. SPUTUM: "Describe the color of your sputum" (none, dry cough; clear, white, yellow, green)     Yellow/green nasal  4. HEMOPTYSIS: "Are you coughing up any blood?" If so ask: "How much?" (flecks, streaks, tablespoons, etc.)     no 5. DIFFICULTY BREATHING: "Are you having difficulty breathing?" If Yes, ask: "How bad is it?" (e.g., mild, moderate, severe)    - MILD: No SOB at rest, mild SOB with walking, speaks normally in sentences, can lie down, no retractions, pulse < 100.    - MODERATE: SOB at rest, SOB with minimal exertion and prefers to sit, cannot lie down flat, speaks in phrases, mild retractions, audible wheezing, pulse 100-120.    - SEVERE: Very SOB at rest, speaks in single words, struggling to breathe, sitting hunched forward, retractions, pulse > 120       mild 6. FEVER: "Do you have a fever?" If Yes, ask: "What is your temperature, how was it measured, and when did it start?"     no 7. CARDIAC HISTORY: "Do you have any history of heart disease?" (e.g., heart attack, congestive heart failure)      Yes, pacemaker/defib. chf 8. LUNG HISTORY: "Do you have any history of lung disease?"  (e.g., pulmonary embolus, asthma, emphysema)     no 9. PE RISK FACTORS: "Do you have a history of blood clots?" (or: recent major surgery, recent prolonged travel, bedridden)     no 10. OTHER SYMPTOMS: "Do you have any other symptoms?" (e.g., runny nose, wheezing, chest pain)       Chest congestion, feels tight  Protocols used: Cough - Acute Non-Productive-A-AH

## 2023-12-23 ENCOUNTER — Ambulatory Visit (INDEPENDENT_AMBULATORY_CARE_PROVIDER_SITE_OTHER): Admitting: General Practice

## 2023-12-23 ENCOUNTER — Encounter: Payer: Self-pay | Admitting: General Practice

## 2023-12-23 ENCOUNTER — Other Ambulatory Visit: Payer: Self-pay | Admitting: Family Medicine

## 2023-12-23 ENCOUNTER — Ambulatory Visit: Admitting: General Practice

## 2023-12-23 VITALS — BP 122/86 | HR 78 | Temp 98.2°F | Ht 68.0 in | Wt 240.0 lb

## 2023-12-23 DIAGNOSIS — J029 Acute pharyngitis, unspecified: Secondary | ICD-10-CM | POA: Diagnosis not present

## 2023-12-23 DIAGNOSIS — R0981 Nasal congestion: Secondary | ICD-10-CM

## 2023-12-23 DIAGNOSIS — J0110 Acute frontal sinusitis, unspecified: Secondary | ICD-10-CM | POA: Diagnosis not present

## 2023-12-23 LAB — POCT INFLUENZA A/B
Influenza A, POC: NEGATIVE
Influenza B, POC: NEGATIVE

## 2023-12-23 LAB — POC COVID19 BINAXNOW: SARS Coronavirus 2 Ag: NEGATIVE

## 2023-12-23 MED ORDER — AMOXICILLIN-POT CLAVULANATE 875-125 MG PO TABS: 1.0000 | ORAL_TABLET | Freq: Two times a day (BID) | ORAL | 0 refills | Status: AC

## 2023-12-23 NOTE — Progress Notes (Signed)
 Established Patient Office Visit  Subjective   Patient ID: [name], female    DOB: [birth year]  Age: 68 y.o. MRN: [id number]  Chief Complaint  Patient presents with   Nasal Congestion    Fatigue, left ear feels clogged, Some sore throat and gets some SOB after moving around x 5 days. Patient has been taking tylenol for sx.     HPI  [name] is a 68 year old female, patient of Dr. Para March, with past medical history of hypertension, OSA, significant cardiac history presents today for an acute visit to discuss nasal congestion.  Symptom onset was 5 days ago, with sore throat, fatigue, dyspnea with exertion, left ear fullness and sinus headache. no fever, no chills, no cough. Overall she feels worse the last few days. She has only tried tylenol over the counter.   Her mother has also been sick for the past week as well.  She has had her flu shot this season. No history of smoking.  Patient Active Problem List   Diagnosis Date Noted   Nasal congestion 12/23/2023   Sore throat 12/23/2023   Acute frontal sinusitis 12/23/2023   Healthcare maintenance 09/17/2023   Hypothyroidism 09/17/2023   Foot drop 09/17/2023   NICM (nonischemic cardiomyopathy) (HCC) 09/08/2023   Orthostatic hypotension 09/08/2023   Parotitis, acute 11/25/2022   History of myocardial infarction 07/05/2022   Spondylosis without myelopathy 07/05/2022   Lumbar radiculopathy 06/18/2022   S/P total knee arthroplasty, right 05/07/2022   Knee pain 04/24/2022   Localized osteoarthritis of knees, bilateral 07/25/2021   Medication monitoring encounter 03/31/2021   Osteomyelitis (HCC) 02/18/2021   CKD (chronic kidney disease) stage 4, GFR 15-29 ml/min (HCC) 01/28/2021   AICD (automatic cardioverter/defibrillator) present 01/28/2021   Diabetic foot ulcer (HCC) 11/24/2020   Advance care planning 10/04/2020   Secondary hyperparathyroidism of renal origin (HCC) 07/20/2019   Benign hypertensive kidney disease  with chronic kidney disease 07/20/2019   Anemia in chronic kidney disease 07/20/2019   Positive antinuclear antibody 07/20/2019   Proteinuria 07/20/2019   Acute on chronic systolic (congestive) heart failure (HCC) 03/23/2019   UTI (urinary tract infection) 03/09/2019   CAD (coronary artery disease) 03/09/2019   DM (diabetes mellitus), type 2 with renal complications (HCC) 03/09/2019   Atrial tachycardia (HCC)    Acute kidney injury superimposed on chronic kidney disease (HCC)    AKI (acute kidney injury) (HCC)    Weakness generalized 12/22/2018   Shortness of breath 12/22/2018   Palliative care encounter 12/22/2018   Endocarditis 12/14/2018   NSTEMI (non-ST elevated myocardial infarction) (HCC) 11/22/2018   Acute on chronic combined systolic and diastolic CHF (congestive heart failure) (HCC) 10/28/2018   Lymphedema 10/28/2018   Severe sepsis (HCC) 09/28/2018   History of amputation of lesser toe of right foot (HCC) 04/19/2018   Presence of cardiac resynchronization therapy defibrillator (CRT-D) 04/19/2018   Paraparesis of both lower limbs (HCC) 03/29/2018   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   Paroxysmal atrial fibrillation (HCC) 03/29/2018   Class 3 obesity due to excess calories with serious comorbidity and body mass index (BMI) of 40.0 to 44.9 in adult 09/02/2016   History of CVA (cerebrovascular accident) 11/11/2015   Chronic renal insufficiency 08/04/2015   OSA (obstructive sleep apnea) 08/15/2014   Morbid obesity (HCC) 10/26/2013   Essential hypertension, benign 04/29/2013   Pure hypercholesterolemia 12/07/2012   Iron deficiency anemia 12/07/2012   Diabetic peripheral neuropathy associated with type 2 diabetes mellitus (HCC) 12/07/2012   Chronic systolic congestive heart failure (HCC)  07/29/2012   Left bundle-branch block 07/25/2012   Coronary artery disease involving native coronary artery of native heart without angina pectoris 07/25/2012   Heart failure (HCC)  07/21/2012   Hyperlipidemia 07/21/2012   Past Medical History:  Diagnosis Date   Acute myocardial infarction, subendocardial infarction, initial episode of care (HCC) 07/21/2012   Acute osteomyelitis involving ankle and foot (HCC) 02/06/2015   Acute systolic heart failure (HCC) 07/21/2012   New onset 07/19/12; admission to [facility] ED. Elevated Troponins.  S/p 2D-echo with EF 20-25%.  S/p cardiac catheterization with stenting LAD.  Repeat 2D-echo 10/2011 with improved EF of 35%.    Anemia    Arthritis    Automatic implantable cardioverter-defibrillator in situ    a. MDT CRT-D 06/2014, SN: [id number]  .removed in feb 2020   CAD (coronary artery disease)    a. cardiac cath 101/04/2012: PCI/DES to chronically occluded mLAD, consideration PCI to diag branch in 4 weeks.    Cataract    Chicken pox    Chronic kidney disease    Chronic systolic CHF (congestive heart failure) (HCC)    a. mixed ICM & NICM; b. EF 20-25% by echo 07/2012, mid-dist 2/3 of LV sev HK/AK, mild MR. echo 10/2012: EF 30-35%, sev HK ant-septal & inf walls, GR1DD, mild MR, PASP 33. c. echo 02/2013: EF 30%, GR1DD, mild MR. echo 04/2014: EF 30%, Septal-lat dyssynchrony, global HK, inf AK, GR1DD, mild MR. d. echo 10/2014: EF50-55%, WM nl, GR1DD, septal mild paradox. e. echo 02/2015: EF 50-55%, wm    Depression    Heart attack (HCC)    Heel ulcer (HCC) 04/27/2015   History of blood transfusion ~ 2011   "plasma; had neuropathy; couldn't walk"   Hypertension    Hypothyroidism    LBBB (left bundle branch block)    Neuromuscular disorder (HCC)    Neuropathy 2011   Obesity, unspecified    OSA on CPAP    Moderate with AHI 23/hr and now on CPAP at 16cm H2O.  Her DME is [facility]   Pure hypercholesterolemia    Sepsis (HCC) 09/2018   Type II diabetes mellitus (HCC)    Unspecified vitamin D deficiency    Past Surgical History:  Procedure Laterality Date   ABDOMINAL AORTOGRAM W/LOWER EXTREMITY N/A 02/02/2021   Procedure: ABDOMINAL AORTOGRAM  W/LOWER EXTREMITY;  Surgeon: Chuck Hint, MD;  Location: [facility] INVASIVE CV LAB;  Service: Cardiovascular;  Laterality: N/A;   AMPUTATION Left 03/02/2021   Procedure: AMPUTATION RAY, left great toe;  Surgeon: Vivi Barrack, DPM;  Location: [facility] OR;  Service: Podiatry;  Laterality: Left;   AMPUTATION TOE Right 06/18/2015   Procedure: AMPUTATION TOE;  Surgeon: Gwyneth Revels, DPM;  Location: [facility] ORS;  Service: Podiatry;  Laterality: Right;   AMPUTATION TOE Left 10/08/2018   Procedure: AMPUTATION TOE LEFT 2ND;  Surgeon: Recardo Evangelist, DPM;  Location: [facility] ORS;  Service: Podiatry;  Laterality: Left;   BACK SURGERY     BI-VENTRICULAR IMPLANTABLE CARDIOVERTER DEFIBRILLATOR N/A 07/06/2014   Procedure: BI-VENTRICULAR IMPLANTABLE CARDIOVERTER DEFIBRILLATOR  (CRT-D);  Surgeon: Duke Salvia, MD;  Location: [facility] CATH LAB;  Service: Cardiovascular;  Laterality: N/A;   BI-VENTRICULAR IMPLANTABLE CARDIOVERTER DEFIBRILLATOR  (CRT-D)  07/06/2014   BILATERAL OOPHORECTOMY  01/2011   ovarian cyst benign   BIV ICD INSERTION CRT-D N/A 03/31/2019   Procedure: BIV ICD INSERTION CRT-D;  Surgeon: Regan Lemming, MD;  Location: [facility] INVASIVE CV LAB;  Service: Cardiovascular;  Laterality: N/A;   COLONOSCOPY WITH PROPOFOL Left  02/22/2015   Procedure: COLONOSCOPY WITH PROPOFOL;  Surgeon: Wallace Cullens, MD;  Location: [facility] ENDOSCOPY;  Service: Endoscopy;  Laterality: Left;   CORONARY ANGIOPLASTY WITH STENT PLACEMENT Left 07/2012   new onset systolic CHF; elevated troponins.  Cardiac catheterization with stenting to LAD; EF 15%.  2D-echo: EF 20-25%.   ESOPHAGOGASTRODUODENOSCOPY N/A 02/22/2015   Procedure: ESOPHAGOGASTRODUODENOSCOPY (EGD);  Surgeon: Wallace Cullens, MD;  Location: [facility] ENDOSCOPY;  Service: Endoscopy;  Laterality: N/A;   I & D EXTREMITY Left 11/28/2020   Procedure: IRRIGATION AND DEBRIDEMENT OF FOOT;  Surgeon: Vivi Barrack, DPM;  Location: [facility] OR;  Service: Podiatry;  Laterality: Left;   I & D EXTREMITY Left  01/31/2021   Procedure: IRRIGATION AND DEBRIDEMENT EXTREMITY OF LEFT FOOT WITH BONE BIOPSY OF  1ST METATARSAL AND FIFTH METATARSAL;  Surgeon: Candelaria Stagers, DPM;  Location: [facility] OR;  Service: Podiatry;  Laterality: Left;   I & D EXTREMITY Left 02/03/2021   Procedure: IRRIGATION AND DEBRIDEMENT EXTREMITY WITH INTERNAL AMPUTATION FIRST AND FIFTH RAY;  Surgeon: Candelaria Stagers, DPM;  Location: [facility] OR;  Service: Podiatry;  Laterality: Left;   INCISION AND DRAINAGE ABSCESS Right 2007   groin; with ICU stay due to sepsis.   IR FLUORO GUIDE CV LINE RIGHT  03/06/2021   IR REMOVAL TUN CV CATH W/O FL  04/27/2021   IR US GUIDE VASC ACCESS RIGHT  03/06/2021   IRRIGATION AND DEBRIDEMENT FOOT Left 04/25/2021   Procedure: EXCISION AND DEBRIDEMENT WOUND ULCER LEFT FOOT; METATARSECTOMY;  Surgeon: Vivi Barrack, DPM;  Location: [facility] ORS;  Service: Podiatry;  Laterality: Left;   LAPAROSCOPIC CHOLECYSTECTOMY  2011   LEFT HEART CATH AND CORONARY ANGIOGRAPHY N/A 10/05/2018   Procedure: LEFT HEART CATH AND CORONARY ANGIOGRAPHY;  Surgeon: Antonieta Iba, MD;  Location: [facility] INVASIVE CV LAB;  Service: Cardiovascular;  Laterality: N/A;   LEFT HEART CATHETERIZATION WITH CORONARY ANGIOGRAM N/A 07/21/2012   Procedure: LEFT HEART CATHETERIZATION WITH CORONARY ANGIOGRAM;  Surgeon: Dolores Patty, MD;  Location: [facility] CATH LAB;  Service: Cardiovascular;  Laterality: N/A;   LUMBAR DISC SURGERY  1996   L4-5   PACEMAKER REMOVAL  11/2018   due to infection around pacemaker   PERCUTANEOUS CORONARY STENT INTERVENTION (PCI-S) N/A 07/23/2012   Procedure: PERCUTANEOUS CORONARY STENT INTERVENTION (PCI-S);  Surgeon: Tonny Bollman, MD;  Location: [facility] CATH LAB;  Service: Cardiovascular;  Laterality: N/A;   PERIPHERAL VASCULAR BALLOON ANGIOPLASTY Left 10/06/2018   Procedure: PERIPHERAL VASCULAR BALLOON ANGIOPLASTY;  Surgeon: Renford Dills, MD;  Location: [facility] INVASIVE CV LAB;  Service: Cardiovascular;  Laterality: Left;   PERIPHERAL  VASCULAR CATHETERIZATION N/A 02/10/2015   Procedure: Picc Line Insertion;  Surgeon: Renford Dills, MD;  Location: [facility] INVASIVE CV LAB;  Service: Cardiovascular;  Laterality: N/A;   RIGHT HEART CATH N/A 03/29/2019   Procedure: RIGHT HEART CATH;  Surgeon: Dolores Patty, MD;  Location: [facility] INVASIVE CV LAB;  Service: Cardiovascular;  Laterality: N/A;   TEE WITHOUT CARDIOVERSION N/A 10/02/2018   Procedure: TRANSESOPHAGEAL ECHOCARDIOGRAM (TEE);  Surgeon: Iran Ouch, MD;  Location: [facility] ORS;  Service: Cardiovascular;  Laterality: N/A;   TOTAL KNEE ARTHROPLASTY Right 05/07/2022   Procedure: TOTAL KNEE ARTHROPLASTY;  Surgeon: Durene Romans, MD;  Location: [facility] ORS;  Service: Orthopedics;  Laterality: Right;   TUBAL LIGATION  1981   VAGINAL HYSTERECTOMY  01/2011   Fibroids/DUB.  Ovaries removed. [place].   Allergies  Allergen Reactions   Heparin Other (See Comments)    Can  not remember reaction   Nsaids Other (See Comments)    Kidney disease         12/23/2023   11:13 AM 09/16/2023    2:38 PM 09/02/2023    8:50 AM  Depression screen PHQ 2/9  Decreased Interest 0 1 0  Down, Depressed, Hopeless 0 0 0  PHQ - 2 Score 0 1 0  Altered sleeping 0 0   Tired, decreased energy 1 2   Change in appetite 0 0   Feeling bad or failure about yourself  0 0   Trouble concentrating 0 0   Moving slowly or fidgety/restless 0 0   Suicidal thoughts 0 0   PHQ-9 Score 1 3   Difficult doing work/chores Not difficult at all Not difficult at all        12/23/2023   11:13 AM 09/16/2023    2:38 PM 11/25/2022    2:21 PM 09/28/2020   10:37 AM  GAD 7 : Generalized Anxiety Score  Nervous, Anxious, on Edge 0 0 0 1  Control/stop worrying 0 1 1 3   Worry too much - different things 1 1 1 3   Trouble relaxing 0 0 0 0  Restless 0 0 0 0  Easily annoyed or irritable 0 0 0 0  Afraid - awful might happen 0 0 0 0  Total GAD 7 Score 1 2 2 7   Anxiety Difficulty Not difficult at all Not difficult at all Not  difficult at all Not difficult at all      Review of Systems  Constitutional:  Negative for chills and fever.  HENT:  Positive for congestion and sore throat. Negative for ear pain and sinus pain.   Respiratory:  Negative for shortness of breath.        Dyspnea with exertion  Cardiovascular:  Negative for chest pain.  Gastrointestinal:  Negative for abdominal pain, constipation, diarrhea, heartburn, nausea and vomiting.  Genitourinary:  Negative for dysuria, frequency and urgency.  Neurological:  Negative for dizziness and headaches.  Endo/Heme/Allergies:  Negative for polydipsia.  Psychiatric/Behavioral:  Negative for depression and suicidal ideas. The patient is not nervous/anxious.       Objective:     BP 122/86 (BP Location: Left Arm, Patient Position: Sitting, Cuff Size: Large)   Pulse 78   Temp 98.2 F (36.8 C) (Oral)   Ht 5\' 8"  (1.727 m)   Wt 240 lb (108.9 kg)   SpO2 98%   BMI 36.49 kg/m  BP Readings from Last 3 Encounters:  12/23/23 122/86  10/16/23 127/73  10/14/23 114/62   Wt Readings from Last 3 Encounters:  12/23/23 240 lb (108.9 kg)  10/16/23 252 lb (114.3 kg)  10/14/23 244 lb 9.6 oz (110.9 kg)      Physical Exam Vitals and nursing note reviewed.  Constitutional:      Appearance: Normal appearance.  HENT:     Right Ear: Tympanic membrane, ear canal and external ear normal.     Left Ear: Tympanic membrane, ear canal and external ear normal.     Nose: Congestion present.     Mouth/Throat:     Mouth: Mucous membranes are moist.     Pharynx: Posterior oropharyngeal erythema present.     Comments: Post nasal drip Eyes:     Conjunctiva/sclera: Conjunctivae normal.  Cardiovascular:     Rate and Rhythm: Normal rate and regular rhythm.     Pulses: Normal pulses.     Heart sounds: Normal heart sounds.  Pulmonary:  Effort: Pulmonary effort is normal.     Breath sounds: Normal breath sounds.  Neurological:     Mental Status: She is alert and  oriented to person, place, and time.  Psychiatric:        Mood and Affect: Mood normal.        Behavior: Behavior normal.        Thought Content: Thought content normal.        Judgment: Judgment normal.      Results for orders placed or performed in visit on 12/23/23  POCT Influenza A/B  Result Value Ref Range   Influenza A, POC Negative Negative   Influenza B, POC Negative Negative  POC COVID-19  Result Value Ref Range   SARS Coronavirus 2 Ag Negative Negative       The ASCVD Risk score (Arnett DK, et al., 2019) failed to calculate for the following reasons:   Risk score cannot be calculated because patient has a medical history suggesting prior/existing ASCVD    Assessment & Plan:  Acute frontal sinusitis, recurrence not specified Assessment & Plan: Symptoms suggestive of acute sinusitis.  Discussed symptomatic care.  Prescription sent for Augmentin x 10 days.  ER precautions given.  She will update if her symptoms worsen or fail to improve.  Orders: -     Amoxicillin-Pot Clavulanate; Take 1 tablet by mouth 2 (two) times daily for 10 days.  Dispense: 20 tablet; Refill: 0  Nasal congestion Assessment & Plan: POC influenza A/B and COVID negative.  Orders: -     POCT Influenza A/B -     POC COVID-19 BinaxNow  Sore throat -     POCT Influenza A/B -     POC COVID-19 BinaxNow    Return if symptoms worsen or fail to improve.    Modesto Charon, NP

## 2023-12-23 NOTE — Patient Instructions (Addendum)
 Start Augmentin antibiotics. Take 1 tablet by mouth twice daily for 10 days. Please finish all of your medication, even if you start to feel better.   Please schedule follow up if your symptoms worsen or fail to improve.  It was a pleasure meeting you!

## 2023-12-23 NOTE — Assessment & Plan Note (Signed)
 Symptoms suggestive of acute sinusitis.  Discussed symptomatic care.  Prescription sent for Augmentin x 10 days.  ER precautions given.  She will update if her symptoms worsen or fail to improve.

## 2023-12-23 NOTE — Assessment & Plan Note (Signed)
 POC influenza A/B and COVID negative.

## 2023-12-25 ENCOUNTER — Ambulatory Visit: Payer: PPO | Admitting: Internal Medicine

## 2023-12-30 ENCOUNTER — Ambulatory Visit: Admitting: Neurology

## 2023-12-30 ENCOUNTER — Encounter: Payer: Self-pay | Admitting: Neurology

## 2023-12-30 VITALS — BP 93/61 | HR 84 | Ht 68.0 in | Wt 252.4 lb

## 2023-12-30 DIAGNOSIS — G510 Bell's palsy: Secondary | ICD-10-CM

## 2023-12-30 DIAGNOSIS — E1142 Type 2 diabetes mellitus with diabetic polyneuropathy: Secondary | ICD-10-CM

## 2023-12-30 DIAGNOSIS — G610 Guillain-Barre syndrome: Secondary | ICD-10-CM

## 2023-12-30 MED ORDER — DULOXETINE HCL 30 MG PO CPEP: 30.0000 mg | ORAL_CAPSULE | Freq: Every day | ORAL | 3 refills | Status: AC

## 2023-12-30 NOTE — Progress Notes (Signed)
 Follow-up Visit   Date: 12/30/23   [name] MRN: [id number] DOB: 10-15-1955   Interim History: [name] is a 68 y.o. right-handed Caucasian female returning to the clinic for follow-up of diabetic polyradiculoneuropathy.  The patient was accompanied to the clinic by mother.   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  HbA1c is well-controlled, HbA1c 6.4 - Reduce gabapentin to 300mg  at bedtime (renally dose) - Continue Cymbalta 30mg  daily  2.  Right Bell's palsy with residual ptosis (08/2022)  Return to clinic in 1 year  --------------------------------------------------------- UPDATE 11/26/2022:   She had Bell's plasy in November which has left her right eyelid weak and tends to droop.  She has noticed increased tearing afterwards.  She has minimal weakness of the lower face.  More recently, she has noticed left jaw fullness and pain.  She saw her PCP who started antibiotics for possible parotid gland infection.  She reports no significant change in her neuropathy in the feet and lower legs.  I had lowered gabapentin to 300mg  at bedtime at her last visit, however due to pain, her gabapentin was increased to 1200mg  at bedtime by PCP.  She has not discussed this with her nephrologist.   UPDATE 12/30/2023:  She is here for 1 year follow-up visit.  She was on pregabalin 25mg  BID by her nephrologist, but was unable to afford it while she was in the donut hole, so had gabapentin tablets and started taking gabapentin 600mg  at bedtime. She also takes duloxetine 30mg  daily.  Pain is controlled.  She has not had any falls or hospitalizations. She is living with her mother now.     Medications:  Current Outpatient Medications on File Prior to Visit  Medication Sig Dispense Refill   acetaminophen (TYLENOL) 500 MG tablet Take 2 tablets (1,000 mg total) by mouth every 6 (six) hours as needed for moderate pain or headache. 30 tablet  0   amiodarone (PACERONE) 200 MG tablet Take 0.5 tablets (100 mg total) by mouth every other day. 45 tablet 1   amoxicillin-clavulanate (AUGMENTIN) 875-125 MG tablet Take 1 tablet by mouth 2 (two) times daily for 10 days. 20 tablet 0   atorvastatin (LIPITOR) 10 MG tablet TAKE ONE TABLET BY MOUTH ONCE DAILY. NEED OFFICE VISIT FOR MORE REFILLS 30 tablet 2   carvedilol (COREG) 3.125 MG tablet Take 1 tablet (3.125 mg total) by mouth 2 (two) times daily. 180 tablet 3   clopidogrel (PLAVIX) 75 MG tablet TAKE ONE TABLET BY MOUTH ONCE A DAY 90 tablet 3   Continuous Blood Gluc Receiver (FREESTYLE LIBRE 2 READER) DEVI Use to check sugar multiple times per day as directed.  H/o DM2, treated with insulin. 1 each 0   Continuous Blood Gluc Receiver (FREESTYLE LIBRE 2 READER) DEVI FreeStyle Libre 2 Reader     Continuous Glucose Sensor (FREESTYLE LIBRE 2 SENSOR) MISC USE TO CHECK BLOOD SUGARS MULTIPLE TIMES DAILY AS DIRECTED CHANGING SENSOR EVERY 14 DAYS DX:E11.9 2 each 3   dapagliflozin propanediol (FARXIGA) 10 MG TABS tablet Take by mouth.     DULoxetine (CYMBALTA) 30 MG capsule Take 1 capsule (30 mg total) by mouth at bedtime. 90 capsule 0   gabapentin (NEURONTIN) 600 MG tablet Take 600 mg by mouth at bedtime.     insulin aspart (NOVOLOG FLEXPEN) 100 UNIT/ML FlexPen INJECT 8 UNITS WITH BREAKFAST     insulin glargine (LANTUS SOLOSTAR) 100 UNIT/ML Solostar Pen 8 units daily in PM 15 mL  1   Insulin Pen Needle (PEN NEEDLES) 32G X 6 MM MISC 1 each by Does not apply route 4 (four) times daily. 150 each 11   Levothyroxine Sodium 37.5 MCG CAPS Take 1 tablet by mouth daily.     Multiple Vitamin (MULTIVITAMIN WITH MINERALS) TABS tablet Take 1 tablet by mouth every morning.     nitroGLYCERIN (NITROSTAT) 0.4 MG SL tablet Place 1 tablet (0.4 mg total) under the tongue every 5 (five) minutes as needed for chest pain. 30 tablet 3   Omega-3 Fatty Acids (FISH OIL) 1000 MG CAPS Take 1,000 mg by mouth 2 (two) times a day.      ONETOUCH ULTRA test strip USE AS INSTRUCTED TO CHECK BLOOD SUGAR 3TIMES DAILY 300 each 4   polyethylene glycol (MIRALAX / GLYCOLAX) 17 g packet Take 17 g by mouth daily as needed for mild constipation. 14 each 0   Semaglutide,0.25 or 0.5MG /DOS, (OZEMPIC, 0.25 OR 0.5 MG/DOSE,) 2 MG/3ML SOPN Inject 0.5 mg as directed per week     torsemide (DEMADEX) 20 MG tablet TAKE ONE TABLET BY MOUTH TWICE A DAY 180 tablet 3   traMADol (ULTRAM) 50 MG tablet TAKE ONE (1) TO TWO (2) TABLETS (50-100 MG TOTAL) BY MOUTH AT BEDTIME AS NEEDED. 60 tablet 2   No current facility-administered medications on file prior to visit.    Allergies:  Allergies  Allergen Reactions   Heparin Other (See Comments)    Can not remember reaction   Nsaids Other (See Comments)    Kidney disease    Vital Signs:  BP 93/61   Pulse 84   Ht 5\' 8"  (1.727 m)   Wt 252 lb 6.4 oz (114.5 kg)   SpO2 99%   BMI 38.38 kg/m   Neurological Exam: MENTAL STATUS including orientation to time, place, person, recent and remote memory, attention span and concentration, language, and fund of knowledge is normal.  Speech is not dysarthric.  CRANIAL NERVES:  Pupils round and reactive bilaterally.  Extraocular muscles intact.  Right ptosis with lid lag and mild right facial asymmetric (Bell's palsy).  Smile is symmetric.    MOTOR:  Motor strength is 5/5 in the RUE and RLE,  LUE with 4/5 finger extensors (old), and 2/5 left dorsoflexion, proximal strength is 5/5.  No pronator drift.  Tone is normal.    SENSORY:  Vibration absent at the ankles, intact at the knees and MCP.  COORDINATION/GAIT:  Gait appears stable, assisted with walker  Data: Lab Results  Component Value Date   CREATININE 2.33 (H) 09/09/2023   BUN 29 (H) 09/09/2023   NA 143 09/09/2023   K 4.6 09/09/2023   CL 103 09/09/2023   CO2 32 09/09/2023   Lab Results  Component Value Date   HGBA1C 6.4 09/09/2023      Thank you for allowing me to participate in patient's care.   If I can answer any additional questions, I would be pleased to do so.    Sincerely,    Mong Neal K. Allena Katz, DO

## 2024-01-05 ENCOUNTER — Telehealth: Payer: Self-pay

## 2024-01-05 NOTE — Telephone Encounter (Signed)
 Copied from CRM [id number]. Topic: General - Other >> Jan 05, 2024 11:44 AM Truddie Crumble wrote: Reason for CRM: patient called stating she need an authorization for ozempic because she forgot to get it renewed

## 2024-01-06 NOTE — Telephone Encounter (Signed)
 LOV: 09/16/23 NOV: NOTHING SCHEDULED LAST REFILL: Semaglutide,0.25 or 0.5MG /DOS, (OZEMPIC, 0.25 OR 0.5 MG/DOSE,) 2 MG/3ML [facility] 01/31/22

## 2024-01-07 ENCOUNTER — Telehealth: Payer: Self-pay

## 2024-01-07 ENCOUNTER — Encounter: Payer: Self-pay | Admitting: Internal Medicine

## 2024-01-07 ENCOUNTER — Other Ambulatory Visit (HOSPITAL_COMMUNITY): Payer: Self-pay

## 2024-01-07 MED ORDER — OZEMPIC (0.25 OR 0.5 MG/DOSE) 2 MG/3ML ~~LOC~~ SOPN: 0.5000 mg | PEN_INJECTOR | SUBCUTANEOUS | 5 refills | Status: DC

## 2024-01-07 NOTE — Telephone Encounter (Signed)
 Pharmacy Patient Advocate Encounter   Received notification from Pt Calls Messages that prior authorization for Ozempic (0.25 or 0.5 MG/DOSE) 2MG /3ML pen-injectors is required/requested.   Insurance verification completed.   The patient is insured through [facility] [employer] .   Per test claim: PA required; PA submitted to above mentioned insurance via CoverMyMeds Key/confirmation #/EOC [id number] Status is pending

## 2024-01-07 NOTE — Telephone Encounter (Signed)
 PA request has been Submitted. New Encounter has been or will be created for follow up. For additional info see Pharmacy Prior Auth telephone encounter from 01/07/2024.

## 2024-01-07 NOTE — Addendum Note (Signed)
 Addended by: Joaquim Nam on: 01/07/2024 02:03 PM   Modules accepted: Orders

## 2024-01-07 NOTE — Telephone Encounter (Signed)
 Please start a prior auth for Ozempic.

## 2024-01-07 NOTE — Telephone Encounter (Signed)
 Patient notified that rx has been sent and we are awaiting prior authorization

## 2024-01-07 NOTE — Telephone Encounter (Signed)
 I sent the rx.  Please start the PA.  Thanks.

## 2024-01-08 ENCOUNTER — Ambulatory Visit: Attending: Internal Medicine | Admitting: Internal Medicine

## 2024-01-08 ENCOUNTER — Encounter: Payer: Self-pay | Admitting: Internal Medicine

## 2024-01-08 VITALS — BP 120/70 | HR 73 | Ht 68.0 in | Wt 253.0 lb

## 2024-01-08 DIAGNOSIS — Z95810 Presence of automatic (implantable) cardiac defibrillator: Secondary | ICD-10-CM | POA: Diagnosis not present

## 2024-01-08 DIAGNOSIS — I10 Essential (primary) hypertension: Secondary | ICD-10-CM | POA: Diagnosis not present

## 2024-01-08 DIAGNOSIS — I5022 Chronic systolic (congestive) heart failure: Secondary | ICD-10-CM

## 2024-01-08 DIAGNOSIS — I951 Orthostatic hypotension: Secondary | ICD-10-CM | POA: Diagnosis not present

## 2024-01-08 DIAGNOSIS — N184 Chronic kidney disease, stage 4 (severe): Secondary | ICD-10-CM | POA: Diagnosis not present

## 2024-01-08 DIAGNOSIS — I2510 Atherosclerotic heart disease of native coronary artery without angina pectoris: Secondary | ICD-10-CM | POA: Diagnosis not present

## 2024-01-08 NOTE — Progress Notes (Signed)
 Patient ID: [name], female   DOB: March 12, 1956, 68 y.o.   MRN: [id number]      Patient Care Team: Joaquim Nam, MD as PCP - General (Family Medicine) Antonieta Iba, MD as PCP - Cardiology (Cardiology) Duke Salvia, MD as PCP - Electrophysiology (Cardiology) Bensimhon, Bevelyn Buckles, MD as PCP - Advanced Heart Failure (Cardiology) Lamont Dowdy, MD as Consulting Physician (Internal Medicine) Orland Jarred, Blanchie Serve (Inactive) as Attending Physician (Podiatry) Lynn Ito, MD as Consulting Physician (Infectious Diseases) Glendale Chard, DO as Consulting Physician (Neurology) Kathyrn Sheriff, [facility] (Inactive) as Pharmacist (Pharmacist) Rickard Patience, MD as Consulting Physician (Oncology) Pa, [facility] Od   HPI  [name] is a 68 y.o. female CRT-D implanted for nonischemic cardiomyopathy with concomitant coronary disease.. Developed recurrent MSSA bacteremia and underwent system extraction 3/20 with aspiration of the thrombus of the lead.  Recurrent heart failure and interval worsening of LV function prompted decision to reimplant done [facility]) 6/20  Following reimplantation LV function has improved considerably (see below)  History of atrial tachycardia and PVCs for which she has been treated with low dose amiodarone.   Seen 1/25 following reprogramming earlier in the office with basically threshold LV output, noted by Dr. Dorthea Cove and reprogrammed--there has been a increasing LV size threshold resulting in variable inadequate capture  The patient denies chest pain, shortness of breath, nocturnal dyspnea, orthopnea or peripheral edema.  There have been no palpitations, lightheadedness or syncope.  Complains of activity is largely limited by her back.  She does walk with a walker but has been walking more of late.  She can "walk all through [facility] "    Patient denies symptoms of respiratory, GI intolerance, sun sensitivity, neurological symptoms  attributable to amiodarone.       Diaphoretic.. >> hypoglycemic>> sugar with improvement      DATE TEST EF%    9/15  echo  30 %    1 /17 echo  50 %    12/19 Echo  45-50%    12/19 TEE   No vegetations  12/19 LHC   pLAD80 with ISR; mLAD70 pCx 80;D1 90 mRCA 40  2/20 CT     2/20 TEE    3/20  (2) Echo  40%   6/20 Echo  10-15%   12/20 Echo 35%   6/21 Echo  50-55%   2/23 Echo  50-55%   11/24 Echo  55%     Date Cr K Hgb TSH LFTs   LDL  3/20 3.54>>1.18  11 4.64  18   7/20 2.15 4.9 9.7 (6/20)17.4    18   9/20  2.02 4.3  21.4  55  11/20 2.17 4.7 11.2 6.17 23   1/21 2.44 4.9      12/21 2.09 5.1 11.6     6/22  (3/22)2.48 1.58 (3/22)4.7 4.2 (3/22)13.0 10.8     2/23 2.15<<1.87   1.12  43  8/23 2.08 5.1 9.4  15   9/24 2.15 4.7 12.5     1/25 1.95 4.8 13.4 0.91 15     Past Medical History:  Diagnosis Date   Acute myocardial infarction, subendocardial infarction, initial episode of care (HCC) 07/21/2012   Acute osteomyelitis involving ankle and foot (HCC) 02/06/2015   Acute systolic heart failure (HCC) 07/21/2012   New onset 07/19/12; admission to [facility] ED. Elevated Troponins.  S/p 2D-echo with EF 20-25%.  S/p cardiac catheterization with stenting LAD.  Repeat 2D-echo 10/2011 with improved  EF of 35%.    Anemia    Arthritis    Automatic implantable cardioverter-defibrillator in situ    a. MDT CRT-D 06/2014, SN: [id number]  .removed in feb 2020   CAD (coronary artery disease)    a. cardiac cath 101/04/2012: PCI/DES to chronically occluded mLAD, consideration PCI to diag branch in 4 weeks.    Cataract    Chicken pox    Chronic kidney disease    Chronic systolic CHF (congestive heart failure) (HCC)    a. mixed ICM & NICM; b. EF 20-25% by echo 07/2012, mid-dist 2/3 of LV sev HK/AK, mild MR. echo 10/2012: EF 30-35%, sev HK ant-septal & inf walls, GR1DD, mild MR, PASP 33. c. echo 02/2013: EF 30%, GR1DD, mild MR. echo 04/2014: EF 30%, Septal-lat dyssynchrony, global HK, inf AK, GR1DD,  mild MR. d. echo 10/2014: EF50-55%, WM nl, GR1DD, septal mild paradox. e. echo 02/2015: EF 50-55%, wm    Depression    Heart attack (HCC)    Heel ulcer (HCC) 04/27/2015   History of blood transfusion ~ 2011   "plasma; had neuropathy; couldn't walk"   Hypertension    Hypothyroidism    LBBB (left bundle branch block)    Neuromuscular disorder (HCC)    Neuropathy 2011   Obesity, unspecified    OSA on CPAP    Moderate with AHI 23/hr and now on CPAP at 16cm H2O.  Her DME is [facility]   Pure hypercholesterolemia    Sepsis (HCC) 09/2018   Type II diabetes mellitus (HCC)    Unspecified vitamin D deficiency    Past Surgical History:  Procedure Laterality Date   ABDOMINAL AORTOGRAM W/LOWER EXTREMITY N/A 02/02/2021   Procedure: ABDOMINAL AORTOGRAM W/LOWER EXTREMITY;  Surgeon: Chuck Hint, MD;  Location: [facility] INVASIVE CV LAB;  Service: Cardiovascular;  Laterality: N/A;   AMPUTATION Left 03/02/2021   Procedure: AMPUTATION RAY, left great toe;  Surgeon: Vivi Barrack, DPM;  Location: [facility] OR;  Service: Podiatry;  Laterality: Left;   AMPUTATION TOE Right 06/18/2015   Procedure: AMPUTATION TOE;  Surgeon: Gwyneth Revels, DPM;  Location: [facility] ORS;  Service: Podiatry;  Laterality: Right;   AMPUTATION TOE Left 10/08/2018   Procedure: AMPUTATION TOE LEFT 2ND;  Surgeon: Recardo Evangelist, DPM;  Location: [facility] ORS;  Service: Podiatry;  Laterality: Left;   BACK SURGERY     BI-VENTRICULAR IMPLANTABLE CARDIOVERTER DEFIBRILLATOR N/A 07/06/2014   Procedure: BI-VENTRICULAR IMPLANTABLE CARDIOVERTER DEFIBRILLATOR  (CRT-D);  Surgeon: Duke Salvia, MD;  Location: [facility] CATH LAB;  Service: Cardiovascular;  Laterality: N/A;   BI-VENTRICULAR IMPLANTABLE CARDIOVERTER DEFIBRILLATOR  (CRT-D)  07/06/2014   BILATERAL OOPHORECTOMY  01/2011   ovarian cyst benign   BIV ICD INSERTION CRT-D N/A 03/31/2019   Procedure: BIV ICD INSERTION CRT-D;  Surgeon: Regan Lemming, MD;  Location: [facility] INVASIVE CV LAB;  Service:  Cardiovascular;  Laterality: N/A;   COLONOSCOPY WITH PROPOFOL Left 02/22/2015   Procedure: COLONOSCOPY WITH PROPOFOL;  Surgeon: Wallace Cullens, MD;  Location: [facility] ENDOSCOPY;  Service: Endoscopy;  Laterality: Left;   CORONARY ANGIOPLASTY WITH STENT PLACEMENT Left 07/2012   new onset systolic CHF; elevated troponins.  Cardiac catheterization with stenting to LAD; EF 15%.  2D-echo: EF 20-25%.   ESOPHAGOGASTRODUODENOSCOPY N/A 02/22/2015   Procedure: ESOPHAGOGASTRODUODENOSCOPY (EGD);  Surgeon: Wallace Cullens, MD;  Location: [facility] ENDOSCOPY;  Service: Endoscopy;  Laterality: N/A;   I & D EXTREMITY Left 11/28/2020   Procedure: IRRIGATION AND DEBRIDEMENT OF FOOT;  Surgeon: Vivi Barrack, DPM;  [facility] OR;  Service: Podiatry;  Laterality: Left;   I & D EXTREMITY Left 01/31/2021   Procedure: IRRIGATION AND DEBRIDEMENT EXTREMITY OF LEFT FOOT WITH BONE BIOPSY OF  1ST METATARSAL AND FIFTH METATARSAL;  Surgeon: Candelaria Stagers, DPM;  Location: [facility] OR;  Service: Podiatry;  Laterality: Left;   I & D EXTREMITY Left 02/03/2021   Procedure: IRRIGATION AND DEBRIDEMENT EXTREMITY WITH INTERNAL AMPUTATION FIRST AND FIFTH RAY;  Surgeon: Candelaria Stagers, DPM;  Location: [facility] OR;  Service: Podiatry;  Laterality: Left;   INCISION AND DRAINAGE ABSCESS Right 2007   groin; with ICU stay due to sepsis.   IR FLUORO GUIDE CV LINE RIGHT  03/06/2021   IR REMOVAL TUN CV CATH W/O FL  04/27/2021   IR US GUIDE VASC ACCESS RIGHT  03/06/2021   IRRIGATION AND DEBRIDEMENT FOOT Left 04/25/2021   Procedure: EXCISION AND DEBRIDEMENT WOUND ULCER LEFT FOOT; METATARSECTOMY;  Surgeon: Vivi Barrack, DPM;  Location: [facility] ORS;  Service: Podiatry;  Laterality: Left;   LAPAROSCOPIC CHOLECYSTECTOMY  2011   LEFT HEART CATH AND CORONARY ANGIOGRAPHY N/A 10/05/2018   Procedure: LEFT HEART CATH AND CORONARY ANGIOGRAPHY;  Surgeon: Antonieta Iba, MD;  Location: [facility] INVASIVE CV LAB;  Service: Cardiovascular;  Laterality: N/A;   LEFT HEART CATHETERIZATION WITH  CORONARY ANGIOGRAM N/A 07/21/2012   Procedure: LEFT HEART CATHETERIZATION WITH CORONARY ANGIOGRAM;  Surgeon: Dolores Patty, MD;  Location: [facility] CATH LAB;  Service: Cardiovascular;  Laterality: N/A;   LUMBAR DISC SURGERY  1996   L4-5   PACEMAKER REMOVAL  11/2018   due to infection around pacemaker   PERCUTANEOUS CORONARY STENT INTERVENTION (PCI-S) N/A 07/23/2012   Procedure: PERCUTANEOUS CORONARY STENT INTERVENTION (PCI-S);  Surgeon: Tonny Bollman, MD;  Location: [facility] CATH LAB;  Service: Cardiovascular;  Laterality: N/A;   PERIPHERAL VASCULAR BALLOON ANGIOPLASTY Left 10/06/2018   Procedure: PERIPHERAL VASCULAR BALLOON ANGIOPLASTY;  Surgeon: Renford Dills, MD;  Location: [facility] INVASIVE CV LAB;  Service: Cardiovascular;  Laterality: Left;   PERIPHERAL VASCULAR CATHETERIZATION N/A 02/10/2015   Procedure: Picc Line Insertion;  Surgeon: Renford Dills, MD;  Location: [facility] INVASIVE CV LAB;  Service: Cardiovascular;  Laterality: N/A;   RIGHT HEART CATH N/A 03/29/2019   Procedure: RIGHT HEART CATH;  Surgeon: Dolores Patty, MD;  Location: [facility] INVASIVE CV LAB;  Service: Cardiovascular;  Laterality: N/A;   TEE WITHOUT CARDIOVERSION N/A 10/02/2018   Procedure: TRANSESOPHAGEAL ECHOCARDIOGRAM (TEE);  Surgeon: Iran Ouch, MD;  Location: [facility] ORS;  Service: Cardiovascular;  Laterality: N/A;   TOTAL KNEE ARTHROPLASTY Right 05/07/2022   Procedure: TOTAL KNEE ARTHROPLASTY;  Surgeon: Durene Romans, MD;  Location: [facility] ORS;  Service: Orthopedics;  Laterality: Right;   TUBAL LIGATION  1981   VAGINAL HYSTERECTOMY  01/2011   Fibroids/DUB.  Ovaries removed. [place].   Current Meds  Medication Sig   acetaminophen (TYLENOL) 500 MG tablet Take 2 tablets (1,000 mg total) by mouth every 6 (six) hours as needed for moderate pain or headache.   amiodarone (PACERONE) 200 MG tablet Take 0.5 tablets (100 mg total) by mouth every other day.   atorvastatin (LIPITOR) 10 MG tablet TAKE ONE TABLET BY MOUTH ONCE  DAILY. NEED OFFICE VISIT FOR MORE REFILLS   carvedilol (COREG) 3.125 MG tablet Take 1 tablet (3.125 mg total) by mouth 2 (two) times daily.   clopidogrel (PLAVIX) 75 MG tablet TAKE ONE TABLET BY MOUTH ONCE A DAY   Continuous Blood Gluc Receiver (FREESTYLE LIBRE 2 READER) DEVI Use to check  sugar multiple times per day as directed.  H/o DM2, treated with insulin.   Continuous Blood Gluc Receiver (FREESTYLE LIBRE 2 READER) DEVI FreeStyle Libre 2 Reader   Continuous Glucose Sensor (FREESTYLE LIBRE 2 SENSOR) MISC USE TO CHECK BLOOD SUGARS MULTIPLE TIMES DAILY AS DIRECTED CHANGING SENSOR EVERY 14 DAYS DX:E11.9   dapagliflozin propanediol (FARXIGA) 10 MG TABS tablet Take by mouth.   DULoxetine (CYMBALTA) 30 MG capsule Take 1 capsule (30 mg total) by mouth at bedtime.   gabapentin (NEURONTIN) 600 MG tablet Take 300 mg by mouth at bedtime.   insulin aspart (NOVOLOG FLEXPEN) 100 UNIT/ML FlexPen INJECT 8 UNITS WITH BREAKFAST   insulin glargine (LANTUS SOLOSTAR) 100 UNIT/ML Solostar Pen 8 units daily in PM   Levothyroxine Sodium 37.5 MCG CAPS Take 1 tablet by mouth daily.   Multiple Vitamin (MULTIVITAMIN WITH MINERALS) TABS tablet Take 1 tablet by mouth every morning.   nitroGLYCERIN (NITROSTAT) 0.4 MG SL tablet Place 1 tablet (0.4 mg total) under the tongue every 5 (five) minutes as needed for chest pain.   Omega-3 Fatty Acids (FISH OIL) 1000 MG CAPS Take 1,000 mg by mouth 2 (two) times a day.   ONETOUCH ULTRA test strip USE AS INSTRUCTED TO CHECK BLOOD SUGAR 3TIMES DAILY   polyethylene glycol (MIRALAX / GLYCOLAX) 17 g packet Take 17 g by mouth daily as needed for mild constipation.   Semaglutide,0.25 or 0.5MG /DOS, (OZEMPIC, 0.25 OR 0.5 MG/DOSE,) 2 MG/3ML SOPN Inject 0.5 mg as directed once a week.   torsemide (DEMADEX) 20 MG tablet TAKE ONE TABLET BY MOUTH TWICE A DAY   traMADol (ULTRAM) 50 MG tablet TAKE ONE (1) TO TWO (2) TABLETS (50-100 MG TOTAL) BY MOUTH AT BEDTIME AS NEEDED.   Allergies  Allergen  Reactions   Heparin Other (See Comments)    Can not remember reaction   Nsaids Other (See Comments)    Kidney disease   Review of Systems negative except from HPI and PMH  Physical Exam: BP 120/70 (BP Location: Left Arm, Patient Position: Sitting, Cuff Size: Large)   Pulse 73   Ht 5\' 8"  (1.727 m)   Wt 253 lb (114.8 kg)   SpO2 98%   BMI 38.47 kg/m    Well developed and Morbidly obese in no acute distress HENT normal Neck supple Clear Device pocket well healed; without hematoma or erythema.  There is no tethering  Regular rate and rhythm, no  murmur Abd-soft with active BS No Clubbing cyanosis 1+ edema Skin-warm and diaphoretic>> dry with sugar repletion  A & Oriented  Grossly normal sensory and motor function  ECG AV pacing @ 73 Neg QRS lead 1 Upright QRS lead V1   Device function is normal.  Programming changes minor adjustment in LV output  See Paceart for details      Vector express run  the only vector in which we did not have diaphragmatic stimulation was 2--coil.  Programmed in this way.       Assessment and  Plan  CRT-D recent reimplantation   Nonischemic cardiomyopathy with resolved LV dysfuntion   Coronary artery disease  Peripheral vascular disease  Orthostatic hypotension  HRrecEF  class 2b  Renal insufficiency grade 3   Hypothyroidism  High Risk Medication Surveillance-Amiodarone   Atrial under sensing  PVCs/PACs  Recent amputation lower extremity digits  Functional status stable with class 3 limitations Continue torsemide and carvedolol; not tolerated SGLT2 2/2 infections  BP well controlled  Orthostasis minimal  No ischemia  continue plavix  and atorva 10  last LDL was 69   at next visit will discuss increasing atorva to 20  Palpitations quiescent on amio and tolerating 100 mg daily

## 2024-01-08 NOTE — Patient Instructions (Signed)
 Medication Instructions:  The current medical regimen is effective;  continue present plan and medications as directed. Please refer to the Current Medication list given to you today.   *If you need a refill on your cardiac medications before your next appointment, please call your pharmacy*  Follow-Up: At [facility], you and your health needs are our priority.  As part of our continuing mission to provide you with exceptional heart care, our providers are all part of one team.  This team includes your primary Cardiologist (physician) and Advanced Practice Providers or APPs (Physician Assistants and Nurse Practitioners) who all work together to provide you with the care you need, when you need it.  Your next appointment:   3 month(s)  Provider:   Sherie Don, NP    We recommend signing up for the patient portal called "MyChart".  Sign up information is provided on this After Visit Summary.  MyChart is used to connect with patients for Virtual Visits (Telemedicine).  Patients are able to view lab/test results, encounter notes, upcoming appointments, etc.  Non-urgent messages can be sent to your provider as well.   To learn more about what you can do with MyChart, go to [email or web address].

## 2024-01-09 NOTE — Telephone Encounter (Signed)
 Pharmacy Patient Advocate Encounter  Received notification from [facility] ADVANTAGE/RX ADVANCE that Prior Authorization for [facility] 2MG /3ML has been APPROVED from 01/08/2024 to 01/07/2025   PLEASE BE ADVISED APPROVAL LETTER HAS BEEN SCANNED IN MEDIA OF CHART  Melanee Spry CPhT Rx Patient Advocate [phone]

## 2024-01-09 NOTE — Telephone Encounter (Signed)
 Patient notified

## 2024-01-09 NOTE — Telephone Encounter (Signed)
 Called patient to advise that prior authorization is complete and she should be able to get her ozempic. Unable to leave a message. Will call back later.

## 2024-01-14 ENCOUNTER — Other Ambulatory Visit: Payer: Self-pay | Admitting: Family Medicine

## 2024-01-14 NOTE — Telephone Encounter (Signed)
 Sent. Thanks.  Please set up OV re: A1c when possible.

## 2024-01-14 NOTE — Telephone Encounter (Signed)
 LOV: 12/23/23 GNF:AOZHYQM SCHEDULED LAST REFILL:  TRAMADOL HYDROCHLORIDE 50MG  TABLET 10/07/23 60 tablets 2 refill

## 2024-01-15 NOTE — Telephone Encounter (Signed)
 Called patient, not able to lvm.

## 2024-01-15 NOTE — Telephone Encounter (Signed)
 Please call patient to schedule her to be seen for A1C check. Thank you

## 2024-01-16 NOTE — Telephone Encounter (Signed)
 Patient scheduled.

## 2024-01-16 NOTE — Telephone Encounter (Signed)
 Not able to lvm

## 2024-01-26 ENCOUNTER — Ambulatory Visit (INDEPENDENT_AMBULATORY_CARE_PROVIDER_SITE_OTHER): Admitting: Family Medicine

## 2024-01-26 ENCOUNTER — Encounter: Payer: Self-pay | Admitting: Family Medicine

## 2024-01-26 VITALS — BP 130/82 | HR 83 | Temp 97.6°F | Ht 68.0 in | Wt 253.0 lb

## 2024-01-26 DIAGNOSIS — Z794 Long term (current) use of insulin: Secondary | ICD-10-CM | POA: Diagnosis not present

## 2024-01-26 DIAGNOSIS — E1122 Type 2 diabetes mellitus with diabetic chronic kidney disease: Secondary | ICD-10-CM | POA: Diagnosis not present

## 2024-01-26 LAB — POCT GLYCOSYLATED HEMOGLOBIN (HGB A1C): Hemoglobin A1C: 6.3 % — AB (ref 4.0–5.6)

## 2024-01-26 MED ORDER — ATORVASTATIN CALCIUM 10 MG PO TABS: 10.0000 mg | ORAL_TABLET | Freq: Every day | ORAL | 3 refills | Status: DC

## 2024-01-26 MED ORDER — NOVOLOG FLEXPEN 100 UNIT/ML ~~LOC~~ SOPN: PEN_INJECTOR | SUBCUTANEOUS | Status: DC

## 2024-01-26 MED ORDER — SEMAGLUTIDE (1 MG/DOSE) 4 MG/3ML ~~LOC~~ SOPN: 1.0000 mg | PEN_INJECTOR | SUBCUTANEOUS | Status: DC

## 2024-01-26 MED ORDER — LANTUS SOLOSTAR 100 UNIT/ML ~~LOC~~ SOPN: PEN_INJECTOR | SUBCUTANEOUS | Status: DC

## 2024-01-26 NOTE — Progress Notes (Signed)
 Diabetes:  Using medications without difficulties: yes Hypoglycemic episodes: rare, if due for a meal, cautions d/w pt.   Hyperglycemic episodes:no Feet problems: still with pain at baseline.   Blood Sugars averaging: 119 this AM prior to breakfast, usually ~240 after breakfast, lower by lunch.   eye exam within last year: yes A1c 6.3.  d/w pt at OV.   D/w pt about inc ozempic  and potentially dec insulin  use.    She is taking 600mg  gabapentin  at night.  300mg  didn't help.  600mg  per day is the max dose based on GFR between 15 and 29, d/w pt.    She is dealing with L knee pain at baseline.  She is seeing Dr. Bernard Brick.    Meds, vitals, and allergies reviewed.   ROS: Per HPI unless specifically indicated in ROS section   GEN: nad, alert and oriented HEENT: ncat NECK: supple w/o LA CV: rrr. PULM: ctab, no inc wob ABD: soft, +bs EXT: L>R ankle edema at baseline.   SKIN: no acute rash  30 minutes were devoted to patient care in this encounter (this includes time spent reviewing the patient's file/history, interviewing and examining the patient, counseling/reviewing plan with patient).

## 2024-01-26 NOTE — Patient Instructions (Addendum)
 Pharmacy staff here should contact you about the meter and the 1mg  ozempic  dose.  Continue with 0.5mg  per dose for now.   When you get the 1mg  dose, you'll likely need to taper both doses of insulin  by 2 units at a time. Update me as needed.   Recheck in about 3 months at a visit.   Take care.  Glad to see you.

## 2024-01-28 ENCOUNTER — Telehealth: Payer: Self-pay

## 2024-01-28 NOTE — Assessment & Plan Note (Signed)
 A1c 6.3.  d/w pt at OV.   D/w pt about inc ozempic  and potentially dec insulin  use.   Pharmacy referral placed regarding meter and Ozempic . When she starts Ozempic  1mg  dose, she will likely need to taper both doses of insulin  by 2 units at a time. Update me as needed.  Recheck in about 3 months at a visit.   She agrees to plan.  She is taking 600mg  gabapentin  at night.  300mg  didn't help.  600mg  per day is the max dose based on GFR between 15 and 29, d/w pt. would continue 600 mg as is for now.  Not sedated.

## 2024-01-28 NOTE — Progress Notes (Signed)
 Care Guide Pharmacy Note  01/28/2024 Name: [name] MRN: [id number] DOB: [birth year]  Referred By: Donnie Galea, MD Reason for referral: Complex Care Management (Outreach to schedule with Pharm d )   [name] is a 68 y.o. year old female who is a primary care patient of Donnie Galea, MD.  [name] was referred to the pharmacist for assistance related to: DMII  An unsuccessful telephone outreach was attempted today to contact the patient who was referred to the pharmacy team for assistance with medication assistance. Additional attempts will be made to contact the patient.  Lenton Rail , RMA     [facility], [facility] Guide  Direct Dial : [phone]  Website: [email or web address]

## 2024-02-02 ENCOUNTER — Ambulatory Visit: Payer: PPO

## 2024-02-02 DIAGNOSIS — I5022 Chronic systolic (congestive) heart failure: Secondary | ICD-10-CM

## 2024-02-02 LAB — CUP PACEART REMOTE DEVICE CHECK
Battery Remaining Longevity: 18 mo
Battery Voltage: 2.92 V
Brady Statistic AP VP Percent: 90.36 %
Brady Statistic AP VS Percent: 0.09 %
Brady Statistic AS VP Percent: 9.52 %
Brady Statistic AS VS Percent: 0.02 %
Brady Statistic RA Percent Paced: 89.95 %
Brady Statistic RV Percent Paced: 99.11 %
Date Time Interrogation Session: 20250428043722
HighPow Impedance: 63 Ohm
Implantable Lead Connection Status: 753985
Implantable Lead Connection Status: 753985
Implantable Lead Connection Status: 753985
Implantable Lead Implant Date: 20200625
Implantable Lead Implant Date: 20200625
Implantable Lead Implant Date: 20200625
Implantable Lead Location: 753858
Implantable Lead Location: 753859
Implantable Lead Location: 753860
Implantable Lead Model: 4398
Implantable Lead Model: 5076
Implantable Lead Model: 6935
Implantable Pulse Generator Implant Date: 20200625
Lead Channel Impedance Value: 1140 Ohm
Lead Channel Impedance Value: 1140 Ohm
Lead Channel Impedance Value: 195.911
Lead Channel Impedance Value: 195.911
Lead Channel Impedance Value: 212.226
Lead Channel Impedance Value: 304 Ohm
Lead Channel Impedance Value: 308.894
Lead Channel Impedance Value: 308.894
Lead Channel Impedance Value: 361 Ohm
Lead Channel Impedance Value: 399 Ohm
Lead Channel Impedance Value: 456 Ohm
Lead Channel Impedance Value: 551 Ohm
Lead Channel Impedance Value: 551 Ohm
Lead Channel Impedance Value: 703 Ohm
Lead Channel Impedance Value: 722 Ohm
Lead Channel Impedance Value: 760 Ohm
Lead Channel Impedance Value: 779 Ohm
Lead Channel Impedance Value: 950 Ohm
Lead Channel Pacing Threshold Amplitude: 0.5 V
Lead Channel Pacing Threshold Amplitude: 0.625 V
Lead Channel Pacing Threshold Amplitude: 3 V
Lead Channel Pacing Threshold Pulse Width: 0.4 ms
Lead Channel Pacing Threshold Pulse Width: 0.4 ms
Lead Channel Pacing Threshold Pulse Width: 1 ms
Lead Channel Sensing Intrinsic Amplitude: 0.875 mV
Lead Channel Sensing Intrinsic Amplitude: 0.875 mV
Lead Channel Sensing Intrinsic Amplitude: 19.625 mV
Lead Channel Sensing Intrinsic Amplitude: 19.625 mV
Lead Channel Setting Pacing Amplitude: 1.75 V
Lead Channel Setting Pacing Amplitude: 2 V
Lead Channel Setting Pacing Amplitude: 3 V
Lead Channel Setting Pacing Pulse Width: 0.4 ms
Lead Channel Setting Pacing Pulse Width: 1 ms
Lead Channel Setting Sensing Sensitivity: 0.3 mV
Zone Setting Status: 755011
Zone Setting Status: 755011

## 2024-02-03 ENCOUNTER — Encounter: Payer: Self-pay | Admitting: Internal Medicine

## 2024-02-03 NOTE — Progress Notes (Signed)
 Care Guide Pharmacy Note  02/03/2024 Name: [name] MRN: [id number] DOB: 1956/09/10  Referred By: Donnie Galea, MD Reason for referral: Complex Care Management (Outreach to schedule with Pharm d )   [name] is a 68 y.o. year old female who is a primary care patient of Donnie Galea, MD.  [name] was referred to the pharmacist for assistance related to: DMII  Successful contact was made with the patient to discuss pharmacy services including being ready for the pharmacist to call at least 5 minutes before the scheduled appointment time and to have medication bottles and any blood pressure readings ready for review. The patient agreed to meet with the pharmacist via telephone visit on (date/time).02/06/2024  Lenton Rail , RMA     [place]  [facility], [facility] Guide  Direct Dial : [phone]  Website: [email or web address]

## 2024-02-06 ENCOUNTER — Other Ambulatory Visit (INDEPENDENT_AMBULATORY_CARE_PROVIDER_SITE_OTHER): Admitting: Pharmacist

## 2024-02-06 DIAGNOSIS — Z794 Long term (current) use of insulin: Secondary | ICD-10-CM

## 2024-02-06 DIAGNOSIS — E1122 Type 2 diabetes mellitus with diabetic chronic kidney disease: Secondary | ICD-10-CM

## 2024-02-06 NOTE — Progress Notes (Unsigned)
   02/06/2024 Name: [name] MRN: [id number] DOB: [birth year]  Subjective  Chief Complaint  Patient presents with   Diabetes   Medication Access    Care Team: Primary Care Provider: Donnie Galea, MD  Reason for visit: ?  [name] is a 68 y.o. female who presents today for a telephone visit with the pharmacist due to medication access; Ozempic , Libre 2+.   Medication Access: ?   Has been taking Ozempic  0.5 mg without concerns. Recently instructed to increase dose to 1.0 mg. Reports she used to receive Ozempic  free through Novo PAP though has been paying for refills the past few months.   Currently using FSL2 with [facility] 2 reader. Reader filled through insurance ~ 1 year ago. Insurance now requiring 2+ sensors given discontinuation or 2 sensors later this year.   Prescription drug coverage: YES Payor: [employer]VANT[employer] / Plan: [employer]ANT[employer] PPO / Product Type: *No Product type* / .   Current Patient Assistance: [employer] (AZ&Me). Previously enrolled in Novo (2024).    Assessment and Plan:   1. Medication Access Ozempic : Previously enrolled in Novo PAP for 2024. Re-enrollment initiated for 2025. Requested dose per PCP Ozempic  1.0 mg.  Patient notes her insulins are already free through insurance.   Libre: Established on [name] 2 with reader (last dispensed June 2023). Therefore, likely not eligible for new Libre 3 reader. Libre 2 sensors no longer being manufactured starting September, transition to [place] 2+ sensors.  PA submitted just in case: [facility] 3 Reader Device - Key [id number]  If denied due to alrady having Libre 2 reader, plan for refill Libre 2+ sensors  Future Appointments  Date Time Provider Department Center  03/17/2024  8:10 AM [facility] ANNUAL WELLNESS VISIT 1 [facility] PEC  04/22/2024  2:20 PM Verona Goodwill, MD [facility] None  05/03/2024  7:10 AM [facility] DEVICE REMOTES [facility] H&V  08/02/2024  7:10 AM [facility] HVT DEVICE REMOTES [facility] H&V   11/01/2024  7:10 AM [facility] HVT DEVICE REMOTES [facility] H&V  01/03/2025  2:30 PM Daryel Ensign, DO [facility] None    Daron Ellen, PharmD Clinical Pharmacist [facility] [phone]

## 2024-02-08 ENCOUNTER — Encounter: Payer: Self-pay | Admitting: Cardiology

## 2024-02-09 ENCOUNTER — Other Ambulatory Visit: Payer: Self-pay | Admitting: Family Medicine

## 2024-02-09 DIAGNOSIS — Z1231 Encounter for screening mammogram for malignant neoplasm of breast: Secondary | ICD-10-CM

## 2024-02-11 ENCOUNTER — Other Ambulatory Visit: Payer: Self-pay | Admitting: Family Medicine

## 2024-02-11 MED ORDER — FREESTYLE LIBRE 2 PLUS SENSOR MISC: 1.0000 | Freq: Every day | 0 refills | Status: DC

## 2024-02-11 NOTE — Progress Notes (Signed)
 Agree. Thanks

## 2024-02-12 ENCOUNTER — Encounter (HOSPITAL_COMMUNITY): Payer: Self-pay

## 2024-02-19 ENCOUNTER — Ambulatory Visit
Admission: RE | Admit: 2024-02-19 | Discharge: 2024-02-19 | Disposition: A | Discharge status: Home / Self Care | Source: Ambulatory Visit | Attending: Family Medicine | Admitting: Family Medicine

## 2024-02-19 DIAGNOSIS — Z1231 Encounter for screening mammogram for malignant neoplasm of breast: Secondary | ICD-10-CM | POA: Diagnosis not present

## 2024-02-25 ENCOUNTER — Ambulatory Visit: Payer: Self-pay | Admitting: Family Medicine

## 2024-03-03 ENCOUNTER — Ambulatory Visit: Admitting: General Practice

## 2024-03-04 ENCOUNTER — Ambulatory Visit (INDEPENDENT_AMBULATORY_CARE_PROVIDER_SITE_OTHER): Admitting: Family Medicine

## 2024-03-04 ENCOUNTER — Encounter: Payer: Self-pay | Admitting: Family Medicine

## 2024-03-04 VITALS — BP 112/60 | HR 72 | Temp 98.4°F | Ht 68.0 in | Wt 254.1 lb

## 2024-03-04 DIAGNOSIS — J0100 Acute maxillary sinusitis, unspecified: Secondary | ICD-10-CM | POA: Diagnosis not present

## 2024-03-04 MED ORDER — AMOXICILLIN-POT CLAVULANATE 875-125 MG PO TABS: 1.0000 | ORAL_TABLET | Freq: Two times a day (BID) | ORAL | 0 refills | Status: DC

## 2024-03-04 NOTE — Progress Notes (Unsigned)
 Julyan Gales T. Vickie Caudell, MD, CAQ Sports Medicine [facility] [address] [place], [address]  Phone: [phone]  FAX: [phone]  [name] - 68 y.o. female  MRN [id number]  Date of Birth: 09-28-1956  Date: 03/04/2024  PCP: Donnie Galea, MD  Referral: Donnie Galea, MD  Chief Complaint  Patient presents with   Cough    Dry x 1 1/2 weeks   Sore Throat   Sneezing   Watery Eyes   Nasal Congestion    Blowing out Green/Yellow Mucus    Subjective:   [name] is a 68 y.o. very pleasant female patient with Body mass index is 38.64 kg/m. who presents with the following:  This is a complicated medical patient with coronary disease, significant congestive heart failure, stage IV chronic kidney disease, diabetes, history of stroke, history of MI who is also on amiodarone , Plavix , insulin  and multiple other medications.  For roughly the last 10 days she has had a lot of nasal congestion, sneezing, pain in the upper face and behind the eyes.  She has had some intermittent chills and sweating, however she does deny fever. No significant polyarthralgia or myalgia.  She does have a lot of nasal congestion and she is blowing out significant chunks of mucus.  She does have some pain in the head and in the front of the head when she bends over.  She did not do a home COVID-19 test.  Review of Systems is noted in the HPI, as appropriate  Objective:   BP 112/60   Pulse 72   Temp 98.4 F (36.9 C) (Temporal)   Ht 5\' 8"  (1.727 m)   Wt 254 lb 2 oz (115.3 kg)   SpO2 100%   BMI 38.64 kg/m    Gen: WDWN, NAD; alert,appropriate and cooperative throughout exam  HEENT: Normocephalic and atraumatic. Throat clear, w/o exudate, no LAD, R TM clear, L TM - good landmarks, No fluid present. rhinnorhea.  Left frontal and maxillary sinuses: Tender Right frontal and maxillary sinuses: Tender  Neck: No ant or post LAD CV: RRR, No  M/G/R Pulm: Breathing comfortably in no resp distress. no w/c/r Psych: full affect, pleasant   Laboratory and Imaging Data:  Assessment and Plan:     ICD-10-CM   1. Acute non-recurrent maxillary sinusitis  J01.00      Persistent respiratory illness for 10 days now with sinus pain and significant congestion.  I am going to place the patient on Augmentin  for management of sinusitis.  Medication Management during today's office visit: Meds ordered this encounter  Medications   amoxicillin -clavulanate (AUGMENTIN ) 875-125 MG tablet    Sig: Take 1 tablet by mouth 2 (two) times daily.    Dispense:  20 tablet    Refill:  0   There are no discontinued medications.  Orders placed today for conditions managed today: No orders of the defined types were placed in this encounter.   Disposition: No follow-ups on file.  [employer] One speech-to-text software was used for transcription in this dictation.  Possible transcriptional errors can occur using Animal nutritionist.   Signed,  Ranny Bye. Emrey Thornley, MD   Outpatient Encounter Medications as of 03/04/2024  Medication Sig   acetaminophen  (TYLENOL ) 500 MG tablet Take 2 tablets (1,000 mg total) by mouth every 6 (six) hours as needed for moderate pain or headache.   amiodarone  (PACERONE ) 200 MG tablet Take 0.5 tablets (100 mg total) by mouth  every other day.   amoxicillin -clavulanate (AUGMENTIN ) 875-125 MG tablet Take 1 tablet by mouth 2 (two) times daily.   atorvastatin  (LIPITOR) 10 MG tablet Take 1 tablet (10 mg total) by mouth at bedtime.   carvedilol  (COREG ) 3.125 MG tablet Take 1 tablet (3.125 mg total) by mouth 2 (two) times daily.   clopidogrel  (PLAVIX ) 75 MG tablet TAKE ONE TABLET BY MOUTH ONCE A DAY   Continuous Blood Gluc Receiver (FREESTYLE LIBRE 2 READER) DEVI Use to check sugar multiple times per day as directed.  H/o DM2, treated with insulin .   Continuous Blood Gluc Receiver (FREESTYLE LIBRE 2 READER) DEVI FreeStyle Libre 2  Reader   Continuous Glucose Sensor (FREESTYLE LIBRE 2 PLUS SENSOR) MISC 1 Device by Does not apply route daily. Use as directed.   Continuous Glucose Sensor (FREESTYLE LIBRE 2 SENSOR) MISC USE TO CHECK BLOOD SUGARS MULTIPLE TIMES DAILY AS DIRECTED CHANGING SENSOR EVERY 14 DAYS DX:E11.9   dapagliflozin propanediol (FARXIGA) 10 MG TABS tablet Take by mouth.   DULoxetine  (CYMBALTA ) 30 MG capsule Take 1 capsule (30 mg total) by mouth at bedtime.   gabapentin  (NEURONTIN ) 600 MG tablet Take 600 mg by mouth at bedtime.   insulin  aspart (NOVOLOG  FLEXPEN) 100 UNIT/ML FlexPen INJECT 14 UNITS WITH BREAKFAST AND 5-8 UNITS WITH DINNER   insulin  glargine (LANTUS  SOLOSTAR) 100 UNIT/ML Solostar Pen 10 units daily in PM   Insulin  Pen Needle (PEN NEEDLES) 32G X 6 MM MISC 1 each by Does not apply route 4 (four) times daily.   Levothyroxine  Sodium 37.5 MCG CAPS Take 1 tablet by mouth daily.   Multiple Vitamin (MULTIVITAMIN WITH MINERALS) TABS tablet Take 1 tablet by mouth every morning.   nitroGLYCERIN  (NITROSTAT ) 0.4 MG SL tablet Place 1 tablet (0.4 mg total) under the tongue every 5 (five) minutes as needed for chest pain.   Omega-3 Fatty Acids (FISH OIL) 1000 MG CAPS Take 1,000 mg by mouth 2 (two) times a day.   ONETOUCH ULTRA test strip USE AS INSTRUCTED TO CHECK BLOOD SUGAR 3TIMES DAILY   polyethylene glycol (MIRALAX  / GLYCOLAX ) 17 g packet Take 17 g by mouth daily as needed for mild constipation.   Semaglutide , 1 MG/DOSE, 4 MG/3ML SOPN Inject 1 mg as directed once a week.   torsemide  (DEMADEX ) 20 MG tablet TAKE ONE TABLET BY MOUTH TWICE A DAY   traMADol  (ULTRAM ) 50 MG tablet TAKE ONE (1) TO TWO (2) TABLETS (50-100 MG TOTAL) BY MOUTH AT BEDTIME AS NEEDED.   No facility-administered encounter medications on file as of 03/04/2024.

## 2024-03-08 ENCOUNTER — Other Ambulatory Visit: Payer: Self-pay | Admitting: Family Medicine

## 2024-03-17 ENCOUNTER — Encounter: Payer: Self-pay | Admitting: Pharmacist

## 2024-03-17 ENCOUNTER — Encounter: Payer: Self-pay | Admitting: Internal Medicine

## 2024-03-17 ENCOUNTER — Ambulatory Visit: Payer: PPO

## 2024-03-17 VITALS — Ht 68.0 in | Wt 254.0 lb

## 2024-03-17 DIAGNOSIS — Z0000 Encounter for general adult medical examination without abnormal findings: Secondary | ICD-10-CM

## 2024-03-17 NOTE — Patient Instructions (Signed)
 [name] , Thank you for taking time out of your busy schedule to complete your Annual Wellness Visit with me. I enjoyed our conversation and look forward to speaking with you again next year. I, as well as your care team,  appreciate your ongoing commitment to your health goals. Please review the following plan we discussed and let me know if I can assist you in the future. Your Game plan/ To Do List    Follow up Visits: Next Medicare AWV with our clinical staff: 03/18/25 @ 1pm televisit   Have you seen your provider in the last 6 months (3 months if uncontrolled diabetes)? Yes Next Office Visit with your provider: PE due in Dec 2025;pt will call later to make (suggested prior to sept)  Clinician Recommendations:  Aim for 30 minutes of exercise or brisk walking, 6-8 glasses of water, and 5 servings of fruits and vegetables each day.       This is a list of the screening recommended for you and due dates:  Health Maintenance  Topic Date Due   DEXA scan (bone density measurement)  Never done   COVID-19 Vaccine (5 - 2024-25 season) 06/08/2023   Flu Shot  05/07/2024   Hemoglobin A1C  07/27/2024   Eye exam for diabetics  07/30/2024   Complete foot exam   09/15/2024   Colon Cancer Screening  02/21/2025   Mammogram  02/18/2026   DTaP/Tdap/Td vaccine (4 - Td or Tdap) 10/28/2032   Pneumonia Vaccine  Completed   Hepatitis C Screening  Completed   Zoster (Shingles) Vaccine  Completed   HPV Vaccine  Aged Out   Meningitis B Vaccine  Aged Out    Advanced directives: (In Chart) A copy of your advanced directives are scanned into your chart should your provider ever need it. Advance Care Planning is important because it:  [x]  Makes sure you receive the medical care that is consistent with your values, goals, and preferences  [x]  It provides guidance to your family and loved ones and reduces their decisional burden about whether or not they are making the right decisions based on your  wishes.  Follow the link provided in your after visit summary or read over the paperwork we have mailed to you to help you started getting your Advance Directives in place. If you need assistance in completing these, please reach out to us  so that we can help you!

## 2024-03-17 NOTE — Progress Notes (Signed)
 Please attest and cosign this visit due to patients primary care provider not being in the office at the time the visit was completed.    Subjective:   [name] is a 68 y.o. who presents for a Medicare Wellness preventive visit.  As a reminder, Annual Wellness Visits don't include a physical exam, and some assessments may be limited, especially if this visit is performed virtually. We may recommend an in-person follow-up visit with your provider if needed.  Visit Complete: Virtual I connected with  [name] on 03/17/24 by a audio enabled telemedicine application and verified that I am speaking with the correct person using two identifiers.  Patient Location: Home  Provider Location: Office/Clinic  I discussed the limitations of evaluation and management by telemedicine. The patient expressed understanding and agreed to proceed.  Vital Signs: Because this visit was a virtual/telehealth visit, some criteria may be missing or patient reported. Any vitals not documented were not able to be obtained and vitals that have been documented are patient reported.  VideoDeclined- This patient declined Librarian, academic. Therefore the visit was completed with audio only.  Persons Participating in Visit: Patient.  AWV Questionnaire: No: Patient Medicare AWV questionnaire was not completed prior to this visit.  Cardiac Risk Factors include: advanced age (>26men, >32 women);diabetes mellitus;dyslipidemia;hypertension;obesity (BMI >30kg/m2);sedentary lifestyle     Objective:     Today's Vitals   03/17/24 0813  Weight: 254 lb (115.2 kg)  Height: 5' 8 (1.727 m)   Body mass index is 38.62 kg/m.     03/17/2024    8:22 AM 12/30/2023   10:41 AM 09/02/2023    8:57 AM 03/17/2023    8:12 AM 11/26/2022    2:41 PM 09/02/2022   10:59 AM 06/11/2022    3:34 PM  Advanced Directives  Does Patient Have a Medical Advance Directive? Yes Yes Yes Yes Yes Yes No   Type of Estate agent of [place];Living will Healthcare Power of [employer] of [place];Living will Healthcare Power of [place];Living will Healthcare Power of [place];Living will;Out of facility DNR (pink MOST or yellow form) Healthcare Power of [place];Living will   Does patient want to make changes to medical advance directive?    No - Patient declined  No - Patient declined   Copy of Healthcare Power of Attorney in Chart? Yes - validated most recent copy scanned in chart (See row information)  Yes - validated most recent copy scanned in chart (See row information) Yes - validated most recent copy scanned in chart (See row information)  Yes - validated most recent copy scanned in chart (See row information)   Would patient like information on creating a medical advance directive?       No - Patient declined    Current Medications (verified) Outpatient Encounter Medications as of 03/17/2024  Medication Sig   acetaminophen  (TYLENOL ) 500 MG tablet Take 2 tablets (1,000 mg total) by mouth every 6 (six) hours as needed for moderate pain or headache.   amiodarone  (PACERONE ) 200 MG tablet Take 0.5 tablets (100 mg total) by mouth every other day.   amoxicillin -clavulanate (AUGMENTIN ) 875-125 MG tablet Take 1 tablet by mouth 2 (two) times daily.   atorvastatin  (LIPITOR) 10 MG tablet Take 1 tablet (10 mg total) by mouth at bedtime.   carvedilol  (COREG ) 3.125 MG tablet Take 1 tablet (3.125 mg total) by mouth 2 (two) times daily.   clopidogrel  (PLAVIX ) 75 MG tablet TAKE ONE TABLET BY MOUTH  ONCE A DAY   Continuous Blood Gluc Receiver (FREESTYLE LIBRE 2 READER) DEVI Use to check sugar multiple times per day as directed.  H/o DM2, treated with insulin .   Continuous Blood Gluc Receiver (FREESTYLE LIBRE 2 READER) DEVI FreeStyle Libre 2 Reader   Continuous Glucose Sensor (FREESTYLE LIBRE 2 PLUS SENSOR) MISC APPLY ONE DEVICE TOPICALLY EVERY 15 DAYS   Continuous Glucose  Sensor (FREESTYLE LIBRE 2 SENSOR) MISC USE TO CHECK BLOOD SUGARS MULTIPLE TIMES DAILY AS DIRECTED CHANGING SENSOR EVERY 14 DAYS DX:E11.9   dapagliflozin propanediol (FARXIGA) 10 MG TABS tablet Take by mouth.   DULoxetine  (CYMBALTA ) 30 MG capsule Take 1 capsule (30 mg total) by mouth at bedtime.   gabapentin  (NEURONTIN ) 600 MG tablet Take 600 mg by mouth at bedtime.   insulin  aspart (NOVOLOG  FLEXPEN) 100 UNIT/ML FlexPen INJECT 14 UNITS WITH BREAKFAST AND 5-8 UNITS WITH DINNER   insulin  glargine (LANTUS  SOLOSTAR) 100 UNIT/ML Solostar Pen 10 units daily in PM   Insulin  Pen Needle (PEN NEEDLES) 32G X 6 MM MISC 1 each by Does not apply route 4 (four) times daily.   Levothyroxine  Sodium 37.5 MCG CAPS Take 1 tablet by mouth daily.   Multiple Vitamin (MULTIVITAMIN WITH MINERALS) TABS tablet Take 1 tablet by mouth every morning.   nitroGLYCERIN  (NITROSTAT ) 0.4 MG SL tablet Place 1 tablet (0.4 mg total) under the tongue every 5 (five) minutes as needed for chest pain.   Omega-3 Fatty Acids (FISH OIL) 1000 MG CAPS Take 1,000 mg by mouth 2 (two) times a day.   ONETOUCH ULTRA test strip USE AS INSTRUCTED TO CHECK BLOOD SUGAR 3TIMES DAILY   polyethylene glycol (MIRALAX  / GLYCOLAX ) 17 g packet Take 17 g by mouth daily as needed for mild constipation.   Semaglutide , 1 MG/DOSE, 4 MG/3ML SOPN Inject 1 mg as directed once a week.   torsemide  (DEMADEX ) 20 MG tablet TAKE ONE TABLET BY MOUTH TWICE A DAY   traMADol  (ULTRAM ) 50 MG tablet TAKE ONE (1) TO TWO (2) TABLETS (50-100 MG TOTAL) BY MOUTH AT BEDTIME AS NEEDED.   No facility-administered encounter medications on file as of 03/17/2024.    Allergies (verified) Heparin  and Nsaids   History: Past Medical History:  Diagnosis Date   Acute myocardial infarction, subendocardial infarction, initial episode of care (HCC) 07/21/2012   Acute osteomyelitis involving ankle and foot (HCC) 02/06/2015   Acute systolic heart failure (HCC) 07/21/2012   New onset 07/19/12;  admission to [facility] ED. Elevated Troponins.  S/p 2D-echo with EF 20-25%.  S/p cardiac catheterization with stenting LAD.  Repeat 2D-echo 10/2011 with improved EF of 35%.    Anemia    Arthritis    Automatic implantable cardioverter-defibrillator in situ    a. MDT CRT-D 06/2014, SN: [id number]  .removed in feb 2020   CAD (coronary artery disease)    a. cardiac cath 101/04/2012: PCI/DES to chronically occluded mLAD, consideration PCI to diag branch in 4 weeks.    Cataract    Chicken pox    Chronic kidney disease    Chronic systolic CHF (congestive heart failure) (HCC)    a. mixed ICM & NICM; b. EF 20-25% by echo 07/2012, mid-dist 2/3 of LV sev HK/AK, mild MR. echo 10/2012: EF 30-35%, sev HK ant-septal & inf walls, GR1DD, mild MR, PASP 33. c. echo 02/2013: EF 30%, GR1DD, mild MR. echo 04/2014: EF 30%, Septal-lat dyssynchrony, global HK, inf AK, GR1DD, mild MR. d. echo 10/2014: EF50-55%, WM nl, GR1DD, septal mild paradox. e. echo  02/2015: EF 50-55%, wm    Depression    Heart attack (HCC)    Heel ulcer (HCC) 04/27/2015   History of blood transfusion ~ 2011   plasma; had neuropathy; couldn't walk   Hypertension    Hypothyroidism    LBBB (left bundle branch block)    Neuromuscular disorder (HCC)    Neuropathy 2011   Obesity, unspecified    OSA on CPAP    Moderate with AHI 23/hr and now on CPAP at 16cm H2O.  Her DME is [facility]   Pure hypercholesterolemia    Sepsis (HCC) 09/2018   Type II diabetes mellitus (HCC)    Unspecified vitamin D  deficiency    Past Surgical History:  Procedure Laterality Date   ABDOMINAL AORTOGRAM W/LOWER EXTREMITY N/A 02/02/2021   Procedure: ABDOMINAL AORTOGRAM W/LOWER EXTREMITY;  Surgeon: Dannis Dy, MD;  Location: [facility] INVASIVE CV LAB;  Service: Cardiovascular;  Laterality: N/A;   AMPUTATION Left 03/02/2021   Procedure: AMPUTATION RAY, left great toe;  Surgeon: Charity Conch, DPM;  Location: [facility] OR;  Service: Podiatry;  Laterality: Left;   AMPUTATION TOE  Right 06/18/2015   Procedure: AMPUTATION TOE;  Surgeon: Anell Baptist, DPM;  Location: [facility] ORS;  Service: Podiatry;  Laterality: Right;   AMPUTATION TOE Left 10/08/2018   Procedure: AMPUTATION TOE LEFT 2ND;  Surgeon: Sharlyn Deaner, DPM;  Location: [facility] ORS;  Service: Podiatry;  Laterality: Left;   BACK SURGERY     BI-VENTRICULAR IMPLANTABLE CARDIOVERTER DEFIBRILLATOR N/A 07/06/2014   Procedure: BI-VENTRICULAR IMPLANTABLE CARDIOVERTER DEFIBRILLATOR  (CRT-D);  Surgeon: Verona Goodwill, MD;  Location[facility] CATH LAB;  Service: Cardiovascular;  Laterality: N/A;   BI-VENTRICULAR IMPLANTABLE CARDIOVERTER DEFIBRILLATOR  (CRT-D)  07/06/2014   BILATERAL OOPHORECTOMY  01/2011   ovarian cyst benign   BIV ICD INSERTION CRT-D N/A 03/31/2019   Procedure: BIV ICD INSERTION CRT-D;  Surgeon: Lei Pump, MD;  Location: [facility] INVASIVE CV LAB;  Service: Cardiovascular;  Laterality: N/A;   COLONOSCOPY WITH PROPOFOL  Left 02/22/2015   Procedure: COLONOSCOPY WITH PROPOFOL ;  Surgeon: Stephens Eis, MD;  Location: [facility] ENDOSCOPY;  Service: Endoscopy;  Laterality: Left;   CORONARY ANGIOPLASTY WITH STENT PLACEMENT Left 07/2012   new onset systolic CHF; elevated troponins.  Cardiac catheterization with stenting to LAD; EF 15%.  2D-echo: EF 20-25%.   ESOPHAGOGASTRODUODENOSCOPY N/A 02/22/2015   Procedure: ESOPHAGOGASTRODUODENOSCOPY (EGD);  Surgeon: Stephens Eis, MD;  Location: [facility] ENDOSCOPY;  Service: Endoscopy;  Laterality: N/A;   I & D EXTREMITY Left 11/28/2020   Procedure: IRRIGATION AND DEBRIDEMENT OF FOOT;  Surgeon: Charity Conch, DPM;  Location: [facility];  Service: Podiatry;  Laterality: Left;   I & D EXTREMITY Left 01/31/2021   Procedure: IRRIGATION AND DEBRIDEMENT EXTREMITY OF LEFT FOOT WITH BONE BIOPSY OF  1ST METATARSAL AND FIFTH METATARSAL;  Surgeon: Velma Ghazi, DPM;  Location: [facility] OR;  Service: Podiatry;  Laterality: Left;   I & D EXTREMITY Left 02/03/2021   Procedure: IRRIGATION AND DEBRIDEMENT EXTREMITY WITH  INTERNAL AMPUTATION FIRST AND FIFTH RAY;  Surgeon: Velma Ghazi, DPM;  Location: [facility] OR;  Service: Podiatry;  Laterality: Left;   INCISION AND DRAINAGE ABSCESS Right 2007   groin; with ICU stay due to sepsis.   IR FLUORO GUIDE CV LINE RIGHT  03/06/2021   IR REMOVAL TUN CV CATH W/O FL  04/27/2021   IR US  GUIDE VASC ACCESS RIGHT  03/06/2021   IRRIGATION AND DEBRIDEMENT FOOT Left 04/25/2021   Procedure: EXCISION AND DEBRIDEMENT WOUND ULCER  LEFT FOOT; METATARSECTOMY;  Surgeon: Charity Conch, DPM;  Location: [facility] ORS;  Service: Podiatry;  Laterality: Left;   LAPAROSCOPIC CHOLECYSTECTOMY  2011   LEFT HEART CATH AND CORONARY ANGIOGRAPHY N/A 10/05/2018   Procedure: LEFT HEART CATH AND CORONARY ANGIOGRAPHY;  Surgeon: Devorah Fonder, MD;  Location: [facility] INVASIVE CV LAB;  Service: Cardiovascular;  Laterality: N/A;   LEFT HEART CATHETERIZATION WITH CORONARY ANGIOGRAM N/A 07/21/2012   Procedure: LEFT HEART CATHETERIZATION WITH CORONARY ANGIOGRAM;  Surgeon: Mardell Shade, MD;  Location: [facility] CATH LAB;  Service: Cardiovascular;  Laterality: N/A;   LUMBAR DISC SURGERY  1996   L4-5   PACEMAKER REMOVAL  11/2018   due to infection around pacemaker   PERCUTANEOUS CORONARY STENT INTERVENTION (PCI-S) N/A 07/23/2012   Procedure: PERCUTANEOUS CORONARY STENT INTERVENTION (PCI-S);  Surgeon: Arnoldo Lapping, MD;  Location: [facility] CATH LAB;  Service: Cardiovascular;  Laterality: N/A;   PERIPHERAL VASCULAR BALLOON ANGIOPLASTY Left 10/06/2018   Procedure: PERIPHERAL VASCULAR BALLOON ANGIOPLASTY;  Surgeon: Jackquelyn Mass, MD;  Location: [facility] INVASIVE CV LAB;  Service: Cardiovascular;  Laterality: Left;   PERIPHERAL VASCULAR CATHETERIZATION N/A 02/10/2015   Procedure: Picc Line Insertion;  Surgeon: Jackquelyn Mass, MD;  Location: [facility] INVASIVE CV LAB;  Service: Cardiovascular;  Laterality: N/A;   RIGHT HEART CATH N/A 03/29/2019   Procedure: RIGHT HEART CATH;  Surgeon: Mardell Shade, MD;  Location: [facility] INVASIVE CV  LAB;  Service: Cardiovascular;  Laterality: N/A;   TEE WITHOUT CARDIOVERSION N/A 10/02/2018   Procedure: TRANSESOPHAGEAL ECHOCARDIOGRAM (TEE);  Surgeon: Wenona Hamilton, MD;  Location: [facility] ORS;  Service: Cardiovascular;  Laterality: N/A;   TOTAL KNEE ARTHROPLASTY Right 05/07/2022   Procedure: TOTAL KNEE ARTHROPLASTY;  Surgeon: Claiborne Crew, MD;  Location: [facility] ORS;  Service: Orthopedics;  Laterality: Right;   TUBAL LIGATION  1981   VAGINAL HYSTERECTOMY  01/2011   Fibroids/DUB.  Ovaries removed. [place].   Family History  Problem Relation Age of Onset   Diabetes Mother    Hypertension Mother    Arthritis Mother        knees, lumbar DDD, cervical DDD   Cancer Father        prostate,skin,lymphoma.   Cancer Brother 57       bladder cancer; non-smoker   Obesity Brother    Diabetes Son    Hypertension Son    Diabetes Maternal Grandmother    Heart disease Maternal Grandmother    COPD Maternal Grandmother    Cancer Maternal Grandfather    Diabetes Paternal Grandmother    Diabetes Paternal Grandfather    Colon cancer Other    Breast cancer Other    Social History   Socioeconomic History   Marital status: Widowed    Spouse name: Porfirio Bristol   Number of children: 2   Years of education: 12   Highest education level: High school graduate  Occupational History   Occupation: disabled    Employer: UNEMPLOYED    Comment: 03/2010 for peripheral neuropathy   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  Tobacco Use   Smoking status: Never   Smokeless tobacco: Never  Vaping Use   Vaping status: Never Used  Substance and Sexual Activity   Alcohol  use: No    Alcohol /week: 0.0 standard drinks of alcohol    Drug use: No   Sexual activity: Not Currently    Birth control/protection: Post-menopausal, Surgical  Other Topics Concern   Not on file  Social History Narrative   Widowed, husband died  when she was in surgery.       Children:  2 children (daughter, son). 3 grandsons and 2 step  grandchildren. 1 great granddaughter.        Employment: disability for peripheral neuropathy 2012.  Previously had home daycare.   Social Drivers of Corporate investment banker Strain: Low Risk  (03/17/2024)   Overall Financial Resource Strain (CARDIA)    Difficulty of Paying Living Expenses: Not hard at all  Food Insecurity: No Food Insecurity (03/17/2024)   Hunger Vital Sign    Worried About Running Out of Food in the Last Year: Never true    Ran Out of Food in the Last Year: Never true  Transportation Needs: No Transportation Needs (03/17/2024)   PRAPARE - Administrator, Civil Service (Medical): No    Lack of Transportation (Non-Medical): No  Physical Activity: Inactive (03/17/2024)   Exercise Vital Sign    Days of Exercise per Week: 0 days    Minutes of Exercise per Session: 0 min  Stress: No Stress Concern Present (03/17/2024)   [employer] of Occupational Health - Occupational Stress Questionnaire    Feeling of Stress : Not at all  Social Connections: Socially Isolated (03/17/2024)   Social Connection and Isolation Panel [NHANES]    Frequency of Communication with Friends and Family: More than three times a week    Frequency of Social Gatherings with Friends and Family: Once a week    Attends Religious Services: Never    Database administrator or Organizations: No    Attends Banker Meetings: Never    Marital Status: Widowed    Tobacco Counseling Counseling given: Not Answered    Clinical Intake:  Pre-visit preparation completed: Yes  Pain : No/denies pain     BMI - recorded: 38.62 Nutritional Status: BMI > 30  Obese Nutritional Risks: None Diabetes: Yes CBG done?: Yes (BS 107 this am) CBG resulted in Enter/ Edit results?: No Did pt. bring in CBG monitor from home?: No  Lab Results  Component Value Date   HGBA1C 6.3 (A) 01/26/2024   HGBA1C 6.4 09/09/2023   HGBA1C 6.5 11/18/2022     How often do you need to have someone help  you when you read instructions, pamphlets, or other written materials from your doctor or pharmacy?: 1 - Never  Interpreter Needed?: No  Comments: lives with mother Information entered by :: B.Davide Risdon,LPN   Activities of Daily Living    Patient Care Team: Donnie Galea, MD as PCP - General (Family Medicine) Devorah Fonder, MD as PCP - Cardiology (Cardiology) Verona Goodwill, MD as PCP - Electrophysiology (Cardiology) Bensimhon, Rheta Celestine, MD as PCP - Advanced Heart Failure (Cardiology) Gari Junior, MD as Consulting Physician (Internal Medicine) Ulanda Gambles, Arcelia Knudsen (Inactive) as Attending Physician (Podiatry) Alica Inks, MD as Consulting Physician (Infectious Diseases) Daryel Ensign, DO as Consulting Physician (Neurology) Timmy Forbes, MD as Consulting Physician (Oncology) Pa, [facility] [place], Gwenevere Lent, [facility] as Pharmacist (Pharmacist)  I have updated your Care Teams any recent Medical Services you may have received from other providers in the past year.     Assessment:    This is a routine wellness examination for [name].  Hearing/Vision screen Hearing Screening - Comments:: Pt says her hearing is good Vision Screening - Comments:: Pt says her vision is good just readers [employer]   Goals Addressed  This Visit's Progress    DIET - REDUCE SUGAR INTAKE   Not on track    03/17/24-Patient states that she wants to try to cut back on her sugar intake daily.      Patient Stated   On track    03/17/24-Keep moving       Depression Screen     03/17/2024    8:19 AM 03/04/2024    3:18 PM 12/23/2023   11:13 AM 09/16/2023    2:38 PM 09/02/2023    8:50 AM 03/17/2023    8:11 AM 11/25/2022    2:20 PM  PHQ 2/9 Scores  PHQ - 2 Score 0 0 0 1 0 0 1  PHQ- 9 Score  0 1 3   1     Fall Risk     03/17/2024    8:16 AM 03/04/2024    3:17 PM 12/30/2023   10:40 AM 12/23/2023   11:13 AM 09/16/2023    2:38 PM  Fall Risk   Falls in the past  year? 0 0 0 0 0  Number falls in past yr: 0 0 0 0 0  Injury with Fall? 0 0 0 0 0  Risk for fall due to : No Fall Risks Impaired mobility  No Fall Risks No Fall Risks  Follow up Education provided;Falls prevention discussed Falls evaluation completed Falls evaluation completed Falls evaluation completed Falls evaluation completed    MEDICARE RISK AT HOME:  Medicare Risk at Home Any stairs in or around the home?: Yes If so, are there any without handrails?: Yes Home free of loose throw rugs in walkways, pet beds, electrical cords, etc?: Yes Adequate lighting in your home to reduce risk of falls?: Yes Life alert?: No Use of a cane, walker or w/c?: Yes Grab bars in the bathroom?: No Shower chair or bench in shower?: Yes Elevated toilet seat or a handicapped toilet?: Yes  TIMED UP AND GO:  Was the test performed?  No  Cognitive Function: 6CIT completed    03/21/2021    9:25 AM  MMSE - Mini Mental State Exam  Not completed: Refused        03/17/2024    8:34 AM 03/17/2023    8:14 AM 09/02/2022   10:59 AM 09/08/2017   11:48 AM  6CIT Screen  What Year? 0 points 0 points 0 points 0 points  What month? 0 points 0 points 0 points 0 points  What time? 0 points 0 points 0 points 0 points  Count back from 20 0 points 0 points 0 points 0 points  Months in reverse 0 points 0 points 0 points 0 points  Repeat phrase 0 points 0 points 0 points 2 points  Total Score 0 points 0 points 0 points 2 points    Immunizations Immunization History  Administered Date(s) Administered   Fluad Quad(high Dose 65+) 10/03/2021, 08/08/2022   Hepatitis B 03/16/2013   Hepatitis B, ADULT 03/30/2014, 07/18/2014   Influenza Split 07/21/2012, 07/03/2020   Influenza, High Dose Seasonal PF 07/01/2019   Influenza,inj,Quad PF,6+ Mos 06/16/2013, 07/07/2014, 06/18/2015, 08/06/2016, 07/20/2018   Influenza-Unspecified 07/23/2017, 07/22/2018, 06/11/2019, 08/28/2023   PFIZER Comirnaty(Gray Top)Covid-19 Tri-Sucrose  Vaccine 06/21/2021   PFIZER(Purple Top)SARS-COV-2 Vaccination 11/21/2019, 12/21/2019, 07/19/2020   PNEUMOCOCCAL CONJUGATE-20 06/21/2021   Pneumococcal Polysaccharide-23 08/08/2011, 06/18/2015, 08/13/2016   Rsv, Bivalent, Protein Subunit Rsvpref,pf Pattricia Bores) 10/19/2022   Tdap 03/07/2010, 05/03/2016, 10/28/2022   Zoster Recombinant(Shingrix) 10/19/2022, 12/16/2022   Zoster, Live 04/06/2014    Screening Tests Health Maintenance  Topic  Date Due   DEXA SCAN  Never done   COVID-19 Vaccine (5 - 2024-25 season) 06/08/2023   INFLUENZA VACCINE  05/07/2024   HEMOGLOBIN A1C  07/27/2024   OPHTHALMOLOGY EXAM  07/30/2024   FOOT EXAM  09/15/2024   Colonoscopy  02/21/2025   MAMMOGRAM  02/18/2026   DTaP/Tdap/Td (4 - Td or Tdap) 10/28/2032   Pneumonia Vaccine 74+ Years old  Completed   Hepatitis C Screening  Completed   Zoster Vaccines- Shingrix  Completed   HPV VACCINES  Aged Out   Meningococcal B Vaccine  Aged Out    Health Maintenance  Health Maintenance Due  Topic Date Due   DEXA SCAN  Never done   COVID-19 Vaccine (5 - 2024-25 season) 06/08/2023   Health Maintenance Items Addressed: None needed at this time  Additional Screening:  Vision Screening: Recommended annual ophthalmology exams for early detection of glaucoma and other disorders of the eye. Would you like a referral to an eye doctor? No    Dental Screening: Recommended annual dental exams for proper oral hygiene  Community Resource Referral / Chronic Care Management: CRR required this visit?  No   CCM required this visit?  No   Plan:    I have personally reviewed and noted the following in the patient's chart:   Medical and social history Use of alcohol , tobacco or illicit drugs  Current medications and supplements including opioid prescriptions. Patient is currently taking opioid prescriptions. Information provided to patient regarding non-opioid alternatives. Patient advised to discuss non-opioid treatment plan  with their provider. Functional ability and status Nutritional status Physical activity Advanced directives List of other physicians Hospitalizations, surgeries, and ER visits in previous 12 months Vitals Screenings to include cognitive, depression, and falls Referrals and appointments  In addition, I have reviewed and discussed with patient certain preventive protocols, quality metrics, and best practice recommendations. A written personalized care plan for preventive services as well as general preventive health recommendations were provided to patient.   Nerissa Bannister, LPN   1/61/0960   After Visit Summary: (MyChart) Due to this being a telephonic visit, the after visit summary with patients personalized plan was offered to patient via MyChart   Notes: Pt is inquiring about a refill of Ozempic . She relays she was working with the pharmacist to obtain and wants to know the status of obtaining.

## 2024-03-17 NOTE — Progress Notes (Signed)
 Patient Assistance Program (PAP) Application   Manufacturer: Novo Nordisk    (Re-enrollment) Medication(s): Ozempic , Novolog , NovoFine  Patient Portion of Application:  Previously submitted, May 2025.  [employer] requests copy of patient insurance card (HTA) - msg sent to clinic pool

## 2024-03-18 NOTE — Progress Notes (Signed)
 Remote ICD transmission.

## 2024-03-19 ENCOUNTER — Encounter: Payer: Self-pay | Admitting: Pharmacist

## 2024-03-19 NOTE — Progress Notes (Unsigned)
 Brief Telephone Documentation Reason for Call: Patient inquiring again about her Novo PAP status.   Summary of Call: Called [employer] again today.   Representative states they did not receive insurance card copy yesterday. Asked if we could manually/verbally enter the information.   Provided insurance claims address.   Re-enrollment Application has been processed and is approved through 10/06/2024.  First shipment expected 10-14 business days   Daron Ellen, PharmD Clinical Pharmacist [facility] [phone]

## 2024-03-26 ENCOUNTER — Other Ambulatory Visit: Payer: Self-pay | Admitting: Family Medicine

## 2024-03-26 MED ORDER — FREESTYLE LIBRE 2 READER DEVI: 1.0000 | Freq: Once | 0 refills | Status: AC

## 2024-03-26 NOTE — Telephone Encounter (Signed)
 Copied from CRM [id number]. Topic: Clinical - Medication Refill >> Mar 26, 2024  1:04 PM [place] A wrote: Medication: Continuous Glucose Sensor (FREESTYLE LIBRE 2 PLUS SENSOR) MISC  Has the patient contacted their pharmacy? Yes (Agent: If no, request that the patient contact the pharmacy for the refill. If patient does not wish to contact the pharmacy document the reason why and proceed with request.) (Agent: If yes, when and what did the pharmacy advise?) No refills  This is the patient's preferred pharmacy:  [facility] - [place], [place] - [address] [place] [address] Phone: [phone] Fax: [phone]  Is this the correct pharmacy for this prescription? Yes If no, delete pharmacy and type the correct one.   Has the prescription been filled recently? No  Is the patient out of the medication? Yes  Has the patient been seen for an appointment in the last year OR does the patient have an upcoming appointment? Yes  Can we respond through MyChart? Yes  Agent: Please be advised that Rx refills may take up to 3 business days. We ask that you follow-up with your pharmacy.

## 2024-04-07 ENCOUNTER — Other Ambulatory Visit (HOSPITAL_COMMUNITY): Payer: Self-pay | Admitting: Internal Medicine

## 2024-04-07 ENCOUNTER — Other Ambulatory Visit: Payer: Self-pay | Admitting: Pharmacist

## 2024-04-07 NOTE — Progress Notes (Signed)
 Brief Telephone Documentation Reason for Call: Patient calling into clinic regarding question for pharmacist  Summary of Call: Patient called to follow up on [employer] application. States she received a letter from [employer] 6/12 stating insurance information missing.   Discussed w patient, called [employer] on 6/13 to provide insurance info. She is aware she has been approved through the remainder of the year.   Approval on 6/13. First shipment theoretically expected within 10-14 business days from the 13th which would be 7/3 at the latest.  [employer] states that first order did not start processing until 6/18. 10-14 business days is therefore next week [employer] automated system to check status of current shipment.  [employer] ID: [id number]  Follow Up: Patient given direct line for further questions/concerns.  [name], PharmD Clinical Pharmacist [facility] [phone]

## 2024-04-13 ENCOUNTER — Other Ambulatory Visit: Payer: Self-pay | Admitting: Family Medicine

## 2024-04-13 NOTE — Telephone Encounter (Signed)
 Last filled on 01/14/24 #60 tabs/ 2 refill  Last OV was PCP was on 01/26/24, no future appts

## 2024-04-14 NOTE — Telephone Encounter (Signed)
Okay to continue, rx sent. Thanks.

## 2024-04-20 ENCOUNTER — Telehealth: Payer: Self-pay

## 2024-04-20 NOTE — Telephone Encounter (Signed)
 Patient also has 4 boxes of ozempic  available for pickup in addition to to 2 boxes of novolog . These items are in the middle fridge. Please do not forget the needles that are upfront.

## 2024-04-20 NOTE — Telephone Encounter (Signed)
 Spoke with patient to advise that she has 2 boxes of needles available for pick up at the front desk. Located in the reception area cabinet

## 2024-04-21 ENCOUNTER — Ambulatory Visit: Admitting: Cardiology

## 2024-04-21 DIAGNOSIS — I493 Ventricular premature depolarization: Secondary | ICD-10-CM | POA: Insufficient documentation

## 2024-04-21 NOTE — Progress Notes (Deleted)
 Electrophysiology Clinic Note    Date:  04/21/2024  Patient ID:  [name], DOB Aug 23, 1956, MRN [id number] PCP:  Cleatus Arlyss GORMAN, MD  Cardiologist:  Evalene Lunger, MD Electrophysiologist: Dr. Fernande > [name], MD  Electrophysiology APP:  Delesha Pohlman, NP  Advanced Heart Failure:  Toribio Fuel, MD    ***refresh  Discussed the use of AI scribe software for clinical note transcription with the patient, who gave verbal consent to proceed.   Patient Profile    Chief Complaint: routine follow-up  History of Present Illness: [name] is a 68 y.o. female with PMH notable for HFimpEF d/t mixed NICM/ICM, CRT-D in situ, ATach, PVCs, CAD s/p PCI, HTN, orthostatic hypotension, PVD, T2DM, CKD-3, OSA on CPAP ; seen today for [name], MD for routine electrophysiology followup.   She had CRT system extracted 12/2018 for MSSA bacteremia, then had worsening of LV function, so re-implanted CRT-D 03/2019. She has had improvement in LVEF since.  Her atach and PVCs have been managed with low-dose amiodarone .   She has had + phrenic stim as of late that Dr. Fernande and I have both tried to program around.  She last saw Dr. Fernande 01/2024 where she was doing well, able to walk around [employer], activity limited by back pain.   On follow-up today, *** AF burden, symptoms *** palpitations *** bleeding concerns  - needs amio labs   -  needs HF appt    Since last being seen in our clinic the patient reports doing ***.  she denies chest pain, palpitations, dyspnea, PND, orthopnea, nausea, vomiting, dizziness, syncope, edema, weight gain, or early satiety.      Arrhythmia/Device History MDT CRT-D, imp 03/2019; dx HFrEF, LBBB   Only vector w/o stim was 2 > coil     AAD History: Amiodarone , low dose     ROS:  Please see the history of present illness. All other systems are reviewed and otherwise negative.    Physical Exam    VS:  There were no vitals  taken for this visit. BMI: There is no height or weight on file to calculate BMI.      Wt Readings from Last 3 Encounters:  03/17/24 254 lb (115.2 kg)  03/04/24 254 lb 2 oz (115.3 kg)  01/26/24 253 lb (114.8 kg)     GEN- The patient is well appearing, alert and oriented x 3 today.   Lungs- Clear to ausculation bilaterally, normal work of breathing.  Heart- {Blank single:19197::Regular,Irregularly irregular} rate and rhythm, no murmurs, rubs or gallops Extremities- {EDEMA LEVEL:28147::No} peripheral edema, warm, dry Skin-  *** device pocket well-healed, no tethering   Device interrogation done today and reviewed by myself:  Battery *** Lead thresholds, impedence, sensing stable *** *** episodes *** changes made today   Studies Reviewed   Previous EP, cardiology notes.    EKG is ordered. Personal review of EKG from today shows:  ***        TTE, 08/14/2023  1. Challenging images, definity  used. Left ventricular ejection fraction,  by estimation, is 55%   2. The left ventricle has normal function. 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 Left ventricular diastolic parameters are consistent with Grade I diastolic dysfunction (impaired relaxation).   3. Right ventricular systolic function is normal. The right ventricular size is normal. Tricuspid regurgitation signal is inadequate for assessing PA pressure.   4. The  mitral valve is normal in structure. No evidence of mitral valve regurgitation. No evidence of mitral stenosis.   5. The aortic valve is normal in structure. Aortic valve regurgitation is not visualized. No aortic stenosis is present.   6. The inferior vena cava is normal in size with greater than 50% respiratory variability, suggesting right atrial pressure of 3 mmHg.    Assessment and Plan     #) PVC #) Atach    #) NICM, ICM s/p CRT-D    #)  HFimpEF   Follows with HF team, due for follow up  #) ***   {Are you ordering a CV Procedure (e.g. stress test, cath, DCCV, TEE, etc)?   Press F2        :[id number]}   Current medicines are reviewed at length with the patient today.   The patient {ACTIONS; HAS/DOES NOT HAVE:19233} concerns regarding her medicines.  The following changes were made today:  {NONE DEFAULTED:18576}  Labs/ tests ordered today include: *** No orders of the defined types were placed in this encounter.    Disposition: Follow up with {EPMDS:28135::EP Team} or EP APP {EPFOLLOW UP:28173}   Signed, Chantal Needle, NP  04/21/24  8:29 AM  Electrophysiology [facility]

## 2024-04-22 ENCOUNTER — Ambulatory Visit: Admitting: Internal Medicine

## 2024-04-22 NOTE — Progress Notes (Unsigned)
 Electrophysiology Clinic Note    Date:  04/23/2024  Patient ID:  [name], DOB 1956/05/16, MRN [id number] PCP:  Cleatus Arlyss GORMAN, MD  Cardiologist:  Evalene Lunger, MD Electrophysiologist: Dr. Fernande > [name], MD  Electrophysiology APP:  Kelissa Merlin, NP  Advanced Heart Failure:  Toribio Fuel, MD     Discussed the use of AI scribe software for clinical note transcription with the patient, who gave verbal consent to proceed.   Patient Profile    Chief Complaint: routine follow-up  History of Present Illness: [name] is a 68 y.o. female with PMH notable for HFimpEF d/t mixed NICM/ICM, CRT-D in situ, ATach, PVCs, CAD s/p PCI, HTN, orthostatic hypotension, PVD, T2DM, CKD-3, OSA on CPAP ; seen today for [name], MD for routine electrophysiology followup.   She had CRT system extracted 12/2018 for MSSA bacteremia, then had worsening of LV function, so re-implanted CRT-D 03/2019. She has had improvement in LVEF since.  Her atach and PVCs have been managed with low-dose amiodarone .   She has had + phrenic stim as of late that Dr. Fernande and I have both tried to program around.  She last saw Dr. Fernande 01/2024 where she was doing well, able to walk around [employer], activity limited by back pain.   On follow-up today, she continues to have phrenic stim, particularly when laying down at night in certain positions. She otherwise denies palpitaitons, chest pain, chest pressure. She is not particularly active but does use her nu-step once in a blue moon. Her activity is mostly limited by knee pain and back pain.     Arrhythmia/Device History MDT CRT-D, imp 03/2019; dx HFrEF, LBBB   Only vector w/o stim was 2 > coil     AAD History: Amiodarone , low dose     ROS:  Please see the history of present illness. All other systems are reviewed and otherwise negative.    Physical Exam    VS:  BP 132/72 (BP Location: Left Arm, Patient Position:  Sitting, Cuff Size: Normal)   Pulse 72   Ht 5' 8 (1.727 m)   Wt 259 lb 12.8 oz (117.8 kg)   SpO2 98%   BMI 39.50 kg/m  BMI: Body mass index is 39.5 kg/m.      Wt Readings from Last 3 Encounters:  04/23/24 259 lb 12.8 oz (117.8 kg)  03/17/24 254 lb (115.2 kg)  03/04/24 254 lb 2 oz (115.3 kg)     GEN- The patient is well appearing, alert and oriented x 3 today.   Lungs- Clear to ausculation bilaterally, normal work of breathing.  Heart- Regular rate and rhythm, no murmurs, rubs or gallops Extremities- Trace L>R peripheral edema, warm, dry Skin-  device pocket well-healed, no tethering   Device interrogation done today and reviewed by myself:  Battery 16 months Lead thresholds, impedence, sensing stable  VP 99% No episodes No changes made today   Studies Reviewed   Previous EP, cardiology notes.    EKG is ordered. Personal review of EKG from today shows:    EKG Interpretation Date/Time:  Friday April 23 2024 14:37:33 EDT Ventricular Rate:  72 PR Interval:    QRS Duration:  174 QT Interval:  476 QTC Calculation: 521 R Axis:   261  Text Interpretation: AV dual-paced rhythm When compared with ECG of 08-Jan-2024 14:00, No significant change was found Confirmed by Rotunda Worden [id number]) on 04/23/2024 2:45:29 PM    TTE, 08/14/2023  1. Challenging images, definity  used. Left ventricular ejection fraction, by estimation, is 55%   2. The left ventricle has normal function. 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 Left ventricular diastolic parameters are consistent with Grade I diastolic dysfunction (impaired relaxation).   3. Right ventricular systolic function is normal. The right ventricular size is normal. Tricuspid regurgitation signal is inadequate for assessing PA pressure.   4. The mitral valve is normal in structure. No evidence of mitral valve regurgitation. No  evidence of mitral stenosis.   5. The aortic valve is normal in structure. Aortic valve regurgitation is not visualized. No aortic stenosis is present.   6. The inferior vena cava is normal in size with greater than 50% respiratory variability, suggesting right atrial pressure of 3 mmHg.    Assessment and Plan     #) PVC #) Atach No recent episodes, no PVCs on today's EKG and high VP Continue 200mg  amiodarone  daily Update amio labs today   #) NICM, ICM s/p CRT-D Device working well, see paceart for details High VP Unable to program around phrenic stim  #) HFimpEF Appears warm and dry on exam, optivol stable currently Unclear how much of her symptoms are cardiac in nature vs deconditioning Encouraged her to use nu-step more often Consider low-impact exercising like swimming or water aerobics        Current medicines are reviewed at length with the patient today.   The patient does not have concerns regarding her medicines.  The following changes were made today:  none  Labs/ tests ordered today include:  Orders Placed This Encounter  Procedures   TSH   T4, free   Hepatic function panel   EKG 12-Lead     Disposition: Follow up with Dr. Cindie or EP APP in 6 months, prefer MD to establish care with new MD   Signed, Chantal Needle, NP  04/23/24  3:49 PM  Electrophysiology [facility]

## 2024-04-23 ENCOUNTER — Telehealth: Payer: Self-pay | Admitting: Family Medicine

## 2024-04-23 ENCOUNTER — Ambulatory Visit: Attending: Cardiology | Admitting: Cardiology

## 2024-04-23 VITALS — BP 132/72 | HR 72 | Ht 68.0 in | Wt 259.8 lb

## 2024-04-23 DIAGNOSIS — I493 Ventricular premature depolarization: Secondary | ICD-10-CM

## 2024-04-23 DIAGNOSIS — Z5181 Encounter for therapeutic drug level monitoring: Secondary | ICD-10-CM | POA: Diagnosis not present

## 2024-04-23 DIAGNOSIS — Z95810 Presence of automatic (implantable) cardiac defibrillator: Secondary | ICD-10-CM

## 2024-04-23 DIAGNOSIS — Z79899 Other long term (current) drug therapy: Secondary | ICD-10-CM

## 2024-04-23 DIAGNOSIS — I5032 Chronic diastolic (congestive) heart failure: Secondary | ICD-10-CM

## 2024-04-23 NOTE — Telephone Encounter (Signed)
 Pt came in and picked up medications.

## 2024-04-23 NOTE — Patient Instructions (Addendum)
 Medication Instructions:  The current medical regimen is effective;  continue present plan and medications as directed. Please refer to the Current Medication list given to you today.   *If you need a refill on your cardiac medications before your next appointment, please call your pharmacy*  Lab Work: Your provider would like for you to have following labs drawn today LFT, TSH, T4.   If you have labs (blood work) drawn today and your tests are completely normal, you will receive your results only by: MyChart Message (if you have MyChart) O-R A paper copy in the mail If you have any lab test that is abnormal or we need to change your treatment, we will call you to review the results.  Follow-Up: At [facility], you and your health needs are our priority.  As part of our continuing mission to provide you with exceptional heart care, our providers are all part of one team.  This team includes your primary Cardiologist (physician) and Advanced Practice Providers or APPs (Physician Assistants and Nurse Practitioners) who all work together to provide you with the care you need, when you need it.  Your next appointment:   6 month(s)  Provider:   Ole Holts, MD or Suzann Riddle, NP    We recommend signing up for the patient portal called MyChart.  Sign up information is provided on this After Visit Summary.  MyChart is used to connect with patients for Virtual Visits (Telemedicine).  Patients are able to view lab/test results, encounter notes, upcoming appointments, etc.  Non-urgent messages can be sent to your provider as well.   To learn more about what you can do with MyChart, go to [email or web address].

## 2024-04-24 LAB — HEPATIC FUNCTION PANEL
ALT: 22 IU/L (ref 0–32)
AST: 33 IU/L (ref 0–40)
Albumin: 4 g/dL (ref 3.9–4.9)
Alkaline Phosphatase: 123 IU/L — ABNORMAL HIGH (ref 44–121)
Bilirubin Total: 0.3 mg/dL (ref 0.0–1.2)
Bilirubin, Direct: 0.14 mg/dL (ref 0.00–0.40)
Total Protein: 7 g/dL (ref 6.0–8.5)

## 2024-04-24 LAB — T4, FREE: Free T4: 1.25 ng/dL (ref 0.82–1.77)

## 2024-04-24 LAB — TSH: TSH: 1.21 u[IU]/mL (ref 0.450–4.500)

## 2024-04-25 ENCOUNTER — Ambulatory Visit: Payer: Self-pay | Admitting: Cardiology

## 2024-05-03 ENCOUNTER — Ambulatory Visit (INDEPENDENT_AMBULATORY_CARE_PROVIDER_SITE_OTHER): Payer: PPO

## 2024-05-03 DIAGNOSIS — I5022 Chronic systolic (congestive) heart failure: Secondary | ICD-10-CM

## 2024-05-03 LAB — CUP PACEART REMOTE DEVICE CHECK
Battery Remaining Longevity: 16 mo
Battery Voltage: 2.91 V
Brady Statistic AP VP Percent: 73.98 %
Brady Statistic AP VS Percent: 0.11 %
Brady Statistic AS VP Percent: 25.91 %
Brady Statistic AS VS Percent: 0.01 %
Brady Statistic RA Percent Paced: 74.03 %
Brady Statistic RV Percent Paced: 99.77 %
Date Time Interrogation Session: 20250728033525
HighPow Impedance: 63 Ohm
Implantable Lead Connection Status: 753985
Implantable Lead Connection Status: 753985
Implantable Lead Connection Status: 753985
Implantable Lead Implant Date: 20200625
Implantable Lead Implant Date: 20200625
Implantable Lead Implant Date: 20200625
Implantable Lead Location: 753858
Implantable Lead Location: 753859
Implantable Lead Location: 753860
Implantable Lead Model: 4398
Implantable Lead Model: 5076
Implantable Lead Model: 6935
Implantable Pulse Generator Implant Date: 20200625
Lead Channel Impedance Value: 1121 Ohm
Lead Channel Impedance Value: 1178 Ohm
Lead Channel Impedance Value: 208.174
Lead Channel Impedance Value: 216.367
Lead Channel Impedance Value: 230.073
Lead Channel Impedance Value: 302.831
Lead Channel Impedance Value: 320.485
Lead Channel Impedance Value: 342 Ohm
Lead Channel Impedance Value: 361 Ohm
Lead Channel Impedance Value: 399 Ohm
Lead Channel Impedance Value: 475 Ohm
Lead Channel Impedance Value: 532 Ohm
Lead Channel Impedance Value: 589 Ohm
Lead Channel Impedance Value: 703 Ohm
Lead Channel Impedance Value: 760 Ohm
Lead Channel Impedance Value: 817 Ohm
Lead Channel Impedance Value: 817 Ohm
Lead Channel Impedance Value: 950 Ohm
Lead Channel Pacing Threshold Amplitude: 0.625 V
Lead Channel Pacing Threshold Amplitude: 0.625 V
Lead Channel Pacing Threshold Amplitude: 2.75 V
Lead Channel Pacing Threshold Pulse Width: 0.4 ms
Lead Channel Pacing Threshold Pulse Width: 0.4 ms
Lead Channel Pacing Threshold Pulse Width: 1 ms
Lead Channel Sensing Intrinsic Amplitude: 0.875 mV
Lead Channel Sensing Intrinsic Amplitude: 0.875 mV
Lead Channel Sensing Intrinsic Amplitude: 19.625 mV
Lead Channel Sensing Intrinsic Amplitude: 19.625 mV
Lead Channel Setting Pacing Amplitude: 1.75 V
Lead Channel Setting Pacing Amplitude: 2 V
Lead Channel Setting Pacing Amplitude: 3 V
Lead Channel Setting Pacing Pulse Width: 0.4 ms
Lead Channel Setting Pacing Pulse Width: 1 ms
Lead Channel Setting Sensing Sensitivity: 0.3 mV
Zone Setting Status: 755011
Zone Setting Status: 755011

## 2024-05-06 ENCOUNTER — Ambulatory Visit: Payer: Self-pay | Admitting: Cardiology

## 2024-05-11 ENCOUNTER — Other Ambulatory Visit: Payer: Self-pay | Admitting: Family Medicine

## 2024-05-11 MED ORDER — FEXOFENADINE HCL 60 MG PO TABS: 60.0000 mg | ORAL_TABLET | Freq: Every day | ORAL | Status: DC

## 2024-05-11 MED ORDER — FEXOFENADINE HCL 180 MG PO TABS: 180.0000 mg | ORAL_TABLET | Freq: Every day | ORAL | Status: DC

## 2024-05-11 NOTE — Progress Notes (Signed)
 D/w pt about trying allegra  for chronic rhinitis.  She can update me as needed.

## 2024-05-17 ENCOUNTER — Other Ambulatory Visit (HOSPITAL_COMMUNITY): Payer: Self-pay | Admitting: Internal Medicine

## 2024-05-18 DIAGNOSIS — E875 Hyperkalemia: Secondary | ICD-10-CM | POA: Diagnosis not present

## 2024-05-18 DIAGNOSIS — N184 Chronic kidney disease, stage 4 (severe): Secondary | ICD-10-CM | POA: Diagnosis not present

## 2024-05-18 DIAGNOSIS — E1122 Type 2 diabetes mellitus with diabetic chronic kidney disease: Secondary | ICD-10-CM | POA: Diagnosis not present

## 2024-05-18 DIAGNOSIS — N2581 Secondary hyperparathyroidism of renal origin: Secondary | ICD-10-CM | POA: Diagnosis not present

## 2024-05-18 DIAGNOSIS — D631 Anemia in chronic kidney disease: Secondary | ICD-10-CM | POA: Diagnosis not present

## 2024-05-18 DIAGNOSIS — R809 Proteinuria, unspecified: Secondary | ICD-10-CM | POA: Diagnosis not present

## 2024-05-18 DIAGNOSIS — I129 Hypertensive chronic kidney disease with stage 1 through stage 4 chronic kidney disease, or unspecified chronic kidney disease: Secondary | ICD-10-CM | POA: Diagnosis not present

## 2024-05-24 ENCOUNTER — Other Ambulatory Visit: Payer: Self-pay | Admitting: Family Medicine

## 2024-05-24 NOTE — Telephone Encounter (Signed)
 Copied from CRM [id number]. Topic: Clinical - Medication Question >> May 24, 2024 11:53 AM [name] wrote: Reason for CRM: Patient is calling in regarding the gabapentin  (NEURONTIN ) 600 MG tablet [[id number]], patient would like her prescription changed to one at night. Patient stated the pharmacy should be sending this as well.

## 2024-06-08 ENCOUNTER — Other Ambulatory Visit: Payer: Self-pay | Admitting: Family Medicine

## 2024-06-08 NOTE — Telephone Encounter (Signed)
 Please advise I do not see Basaglar  insulin  on current med list

## 2024-06-09 NOTE — Telephone Encounter (Signed)
 Please verify Lantus  versus Basaglar  with patient, along with her dose.  This may be an insurance coverage issue.  Either would be a reasonable option.  Thanks.

## 2024-06-10 ENCOUNTER — Other Ambulatory Visit: Payer: Self-pay

## 2024-06-10 MED ORDER — LANTUS SOLOSTAR 100 UNIT/ML ~~LOC~~ SOPN: PEN_INJECTOR | SUBCUTANEOUS | 3 refills | Status: DC

## 2024-06-16 ENCOUNTER — Other Ambulatory Visit: Payer: Self-pay | Admitting: Family Medicine

## 2024-06-17 NOTE — Telephone Encounter (Signed)
 LOV: 03/04/2024 NOV: nothing scheduled  Last Refill: traMADol  (ULTRAM ) 50 MG tablet  04/14/2024 60 tablets 2 refills

## 2024-06-22 ENCOUNTER — Ambulatory Visit: Admission: EM | Admit: 2024-06-22 | Discharge: 2024-06-22 | Disposition: A | Discharge status: Home / Self Care

## 2024-06-22 ENCOUNTER — Telehealth: Payer: Self-pay | Admitting: Emergency Medicine

## 2024-06-22 ENCOUNTER — Ambulatory Visit
Admission: RE | Admit: 2024-06-22 | Discharge: 2024-06-22 | Disposition: A | Discharge status: Home / Self Care | Source: Ambulatory Visit | Attending: Emergency Medicine | Admitting: Emergency Medicine

## 2024-06-22 ENCOUNTER — Ambulatory Visit: Payer: Self-pay

## 2024-06-22 ENCOUNTER — Ambulatory Visit
Admission: RE | Admit: 2024-06-22 | Discharge: 2024-06-22 | Disposition: A | Discharge status: Home / Self Care | Attending: Emergency Medicine | Admitting: Emergency Medicine

## 2024-06-22 ENCOUNTER — Encounter: Payer: Self-pay | Admitting: Internal Medicine

## 2024-06-22 ENCOUNTER — Encounter: Payer: Self-pay | Admitting: Emergency Medicine

## 2024-06-22 DIAGNOSIS — M25431 Effusion, right wrist: Secondary | ICD-10-CM | POA: Diagnosis not present

## 2024-06-22 DIAGNOSIS — W540XXA Bitten by dog, initial encounter: Secondary | ICD-10-CM | POA: Insufficient documentation

## 2024-06-22 DIAGNOSIS — S61531A Puncture wound without foreign body of right wrist, initial encounter: Secondary | ICD-10-CM | POA: Diagnosis not present

## 2024-06-22 DIAGNOSIS — S61551A Open bite of right wrist, initial encounter: Secondary | ICD-10-CM | POA: Insufficient documentation

## 2024-06-22 DIAGNOSIS — M7989 Other specified soft tissue disorders: Secondary | ICD-10-CM | POA: Diagnosis not present

## 2024-06-22 DIAGNOSIS — L089 Local infection of the skin and subcutaneous tissue, unspecified: Secondary | ICD-10-CM

## 2024-06-22 DIAGNOSIS — M25531 Pain in right wrist: Secondary | ICD-10-CM | POA: Insufficient documentation

## 2024-06-22 MED ORDER — AMOXICILLIN-POT CLAVULANATE 875-125 MG PO TABS
1.0000 | ORAL_TABLET | Freq: Two times a day (BID) | ORAL | 0 refills | Status: DC
Start: 2024-06-22 — End: 2024-07-13

## 2024-06-22 NOTE — Discharge Instructions (Addendum)
 Go to [facility] for your x-ray.    [address] [facility] [address]  [address] [place], [place] [address] [phone]  I will call you with the results.   Take the Augmentin  as directed.  Follow up with your primary care provider tomorrow.  Go to the emergency department if you have worsening symptoms.

## 2024-06-22 NOTE — ED Provider Notes (Signed)
 [name]    CSN: [id number] Arrival date & time: 06/22/24  1345      History   Chief Complaint Chief Complaint  Patient presents with   Animal Bite    HPI [name] is a 68 y.o. female.  Patient presents with puncture wounds due to a dog bite on her right wrist.  The injury occurred 3 days ago.  She has been treating the wounds with rubbing alcohol .  The area around the wounds is red, swollen, and painful.  No fever, chills, purulent drainage, red streaks.  The dog belongs to her mother and is fully vaccinated.  Last tetanus 2024.  Patient requests an x-ray of her wrist.  The history is provided by the patient and medical records.    Past Medical History:  Diagnosis Date   Acute myocardial infarction, subendocardial infarction, initial episode of care (HCC) 07/21/2012   Acute osteomyelitis involving ankle and foot (HCC) 02/06/2015   Acute systolic heart failure (HCC) 07/21/2012   New onset 07/19/12; admission to [facility] ED. Elevated Troponins.  S/p 2D-echo with EF 20-25%.  S/p cardiac catheterization with stenting LAD.  Repeat 2D-echo 10/2011 with improved EF of 35%.    Anemia    Arthritis    Automatic implantable cardioverter-defibrillator in situ    a. MDT CRT-D 06/2014, SN: [id number]  .removed in feb 2020   CAD (coronary artery disease)    a. cardiac cath 101/04/2012: PCI/DES to chronically occluded mLAD, consideration PCI to diag branch in 4 weeks.    Cataract    Chicken pox    Chronic kidney disease    Chronic systolic CHF (congestive heart failure) (HCC)    a. mixed ICM & NICM; b. EF 20-25% by echo 07/2012, mid-dist 2/3 of LV sev HK/AK, mild MR. echo 10/2012: EF 30-35%, sev HK ant-septal & inf walls, GR1DD, mild MR, PASP 33. c. echo 02/2013: EF 30%, GR1DD, mild MR. echo 04/2014: EF 30%, Septal-lat dyssynchrony, global HK, inf AK, GR1DD, mild MR. d. echo 10/2014: EF50-55%, WM nl, GR1DD, septal mild paradox. e. echo 02/2015: EF 50-55%, wm    Depression     Heart attack (HCC)    Heel ulcer (HCC) 04/27/2015   History of blood transfusion ~ 2011   plasma; had neuropathy; couldn't walk   Hypertension    Hypothyroidism    LBBB (left bundle branch block)    Neuromuscular disorder (HCC)    Neuropathy 2011   Obesity, unspecified    OSA on CPAP    Moderate with AHI 23/hr and now on CPAP at 16cm H2O.  Her DME is [facility]   Pure hypercholesterolemia    Sepsis (HCC) 09/2018   Type II diabetes mellitus (HCC)    Unspecified vitamin D  deficiency     Patient Active Problem List   Diagnosis Date Noted   PVC (premature ventricular contraction) 04/21/2024   Nasal congestion 12/23/2023   Healthcare maintenance 09/17/2023   Hypothyroidism 09/17/2023   Foot drop 09/17/2023   NICM (nonischemic cardiomyopathy) (HCC) 09/08/2023   Orthostatic hypotension 09/08/2023   History of myocardial infarction 07/05/2022   Spondylosis without myelopathy 07/05/2022   Lumbar radiculopathy 06/18/2022   S/P total knee arthroplasty, right 05/07/2022   Knee pain 04/24/2022   Localized osteoarthritis of knees, bilateral 07/25/2021   Medication monitoring encounter 03/31/2021   Osteomyelitis (HCC) 02/18/2021   CKD (chronic kidney disease) stage 4, GFR 15-29 ml/min (HCC) 01/28/2021   AICD (automatic cardioverter/defibrillator) present 01/28/2021   Diabetic foot ulcer (HCC)  11/24/2020   Advance care planning 10/04/2020   Secondary hyperparathyroidism of renal origin (HCC) 07/20/2019   Benign hypertensive kidney disease with chronic kidney disease 07/20/2019   Anemia in chronic kidney disease 07/20/2019   Positive antinuclear antibody 07/20/2019   Proteinuria 07/20/2019   Acute on chronic systolic (congestive) heart failure (HCC) 03/23/2019   CAD (coronary artery disease) 03/09/2019   DM (diabetes mellitus), type 2 with renal complications (HCC) 03/09/2019   Atrial tachycardia (HCC)    Acute kidney injury superimposed on chronic kidney disease (HCC)    AKI (acute kidney  injury) (HCC)    Shortness of breath 12/22/2018   Palliative care encounter 12/22/2018   NSTEMI (non-ST elevated myocardial infarction) (HCC) 11/22/2018   Acute on chronic combined systolic and diastolic CHF (congestive heart failure) (HCC) 10/28/2018   Lymphedema 10/28/2018   Severe sepsis (HCC) 09/28/2018   History of amputation of lesser toe of right foot (HCC) 04/19/2018   Presence of cardiac resynchronization therapy defibrillator (CRT-D) 04/19/2018   Paraparesis of both lower limbs (HCC) 03/29/2018   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   Paroxysmal atrial fibrillation (HCC) 03/29/2018   Class 3 obesity due to excess calories with serious comorbidity and body mass index (BMI) of 40.0 to 44.9 in adult 09/02/2016   History of CVA (cerebrovascular accident) 11/11/2015   Chronic renal insufficiency 08/04/2015   OSA (obstructive sleep apnea) 08/15/2014   Morbid obesity (HCC) 10/26/2013   Essential hypertension, benign 04/29/2013   Pure hypercholesterolemia 12/07/2012   Iron  deficiency anemia 12/07/2012   Diabetic peripheral neuropathy associated with type 2 diabetes mellitus (HCC) 12/07/2012   Chronic systolic congestive heart failure (HCC) 07/29/2012   Left bundle-branch block 07/25/2012   Coronary artery disease involving native coronary artery of native heart without angina pectoris 07/25/2012   Heart failure with improved ejection fraction (HFimpEF) (HCC) 07/21/2012   Hyperlipidemia 07/21/2012    Past Surgical History:  Procedure Laterality Date   ABDOMINAL AORTOGRAM W/LOWER EXTREMITY N/A 02/02/2021   Procedure: ABDOMINAL AORTOGRAM W/LOWER EXTREMITY;  Surgeon: Eliza Lonni RAMAN, MD;  Location: [facility] INVASIVE CV LAB;  Service: Cardiovascular;  Laterality: N/A;   AMPUTATION Left 03/02/2021   Procedure: AMPUTATION RAY, left great toe;  Surgeon: Gershon Donnice SAUNDERS, DPM;  Location: [facility] OR;  Service: Podiatry;  Laterality: Left;   AMPUTATION TOE Right 06/18/2015   Procedure:  AMPUTATION TOE;  Surgeon: Eva Gay, DPM;  Location: [facility] ORS;  Service: Podiatry;  Laterality: Right;   AMPUTATION TOE Left 10/08/2018   Procedure: AMPUTATION TOE LEFT 2ND;  Surgeon: Lilli Donnice, DPM;  Location: [facility] ORS;  Service: Podiatry;  Laterality: Left;   BACK SURGERY     BI-VENTRICULAR IMPLANTABLE CARDIOVERTER DEFIBRILLATOR N/A 07/06/2014   Procedure: BI-VENTRICULAR IMPLANTABLE CARDIOVERTER DEFIBRILLATOR  (CRT-D);  Surgeon: Elspeth JAYSON Sage, MD;  Location: [facility] CATH LAB;  Service: Cardiovascular;  Laterality: N/A;   BI-VENTRICULAR IMPLANTABLE CARDIOVERTER DEFIBRILLATOR  (CRT-D)  07/06/2014   BILATERAL OOPHORECTOMY  01/2011   ovarian cyst benign   BIV ICD INSERTION CRT-D N/A 03/31/2019   Procedure: BIV ICD INSERTION CRT-D;  Surgeon: Inocencio Soyla Lunger, MD;  Location: [facility] INVASIVE CV LAB;  Service: Cardiovascular;  Laterality: N/A;   COLONOSCOPY WITH PROPOFOL  Left 02/22/2015   Procedure: COLONOSCOPY WITH PROPOFOL ;  Surgeon: Deward CINDERELLA Piedmont, MD;  Location: [facility] ENDOSCOPY;  Service: Endoscopy;  Laterality: Left;   CORONARY ANGIOPLASTY WITH STENT PLACEMENT Left 07/2012   new onset systolic CHF; elevated troponins.  Cardiac catheterization with stenting to LAD; EF 15%.  2D-echo: EF 20-25%.  ESOPHAGOGASTRODUODENOSCOPY N/A 02/22/2015   Procedure: ESOPHAGOGASTRODUODENOSCOPY (EGD);  Surgeon: Deward CINDERELLA Piedmont, MD;  Location: [facility] ENDOSCOPY;  Service: Endoscopy;  Laterality: N/A;   I & D EXTREMITY Left 11/28/2020   Procedure: IRRIGATION AND DEBRIDEMENT OF FOOT;  Surgeon: Gershon Donnice SAUNDERS, DPM;  Location: [facility] OR;  Service: Podiatry;  Laterality: Left;   I & D EXTREMITY Left 01/31/2021   Procedure: IRRIGATION AND DEBRIDEMENT EXTREMITY OF LEFT FOOT WITH BONE BIOPSY OF  1ST METATARSAL AND FIFTH METATARSAL;  Surgeon: Tobie Franky SQUIBB, DPM;  Location: [facility] OR;  Service: Podiatry;  Laterality: Left;   I & D EXTREMITY Left 02/03/2021   Procedure: IRRIGATION AND DEBRIDEMENT EXTREMITY WITH INTERNAL AMPUTATION FIRST AND FIFTH  RAY;  Surgeon: Tobie Franky SQUIBB, DPM;  Location: [facility] OR;  Service: Podiatry;  Laterality: Left;   INCISION AND DRAINAGE ABSCESS Right 2007   groin; with ICU stay due to sepsis.   IR FLUORO GUIDE CV LINE RIGHT  03/06/2021   IR REMOVAL TUN CV CATH W/O FL  04/27/2021   IR US  GUIDE VASC ACCESS RIGHT  03/06/2021   IRRIGATION AND DEBRIDEMENT FOOT Left 04/25/2021   Procedure: EXCISION AND DEBRIDEMENT WOUND ULCER LEFT FOOT; METATARSECTOMY;  Surgeon: Gershon Donnice SAUNDERS, DPM;  Location: [facility] ORS;  Service: Podiatry;  Laterality: Left;   LAPAROSCOPIC CHOLECYSTECTOMY  2011   LEFT HEART CATH AND CORONARY ANGIOGRAPHY N/A 10/05/2018   Procedure: LEFT HEART CATH AND CORONARY ANGIOGRAPHY;  Surgeon: Perla Evalene PARAS, MD;  Location: [facility] INVASIVE CV LAB;  Service: Cardiovascular;  Laterality: N/A;   LEFT HEART CATHETERIZATION WITH CORONARY ANGIOGRAM N/A 07/21/2012   Procedure: LEFT HEART CATHETERIZATION WITH CORONARY ANGIOGRAM;  Surgeon: Toribio SAUNDERS Fuel, MD;  Location: [facility] CATH LAB;  Service: Cardiovascular;  Laterality: N/A;   LUMBAR DISC SURGERY  1996   L4-5   PACEMAKER REMOVAL  11/2018   due to infection around pacemaker   PERCUTANEOUS CORONARY STENT INTERVENTION (PCI-S) N/A 07/23/2012   Procedure: PERCUTANEOUS CORONARY STENT INTERVENTION (PCI-S);  Surgeon: Ozell Fell, MD;  Location: [facility] CATH LAB;  Service: Cardiovascular;  Laterality: N/A;   PERIPHERAL VASCULAR BALLOON ANGIOPLASTY Left 10/06/2018   Procedure: PERIPHERAL VASCULAR BALLOON ANGIOPLASTY;  Surgeon: Jama Cordella MATSU, MD;  Location: [facility] INVASIVE CV LAB;  Service: Cardiovascular;  Laterality: Left;   PERIPHERAL VASCULAR CATHETERIZATION N/A 02/10/2015   Procedure: Picc Line Insertion;  Surgeon: Cordella MATSU Jama, MD;  Location: [facility] INVASIVE CV LAB;  Service: Cardiovascular;  Laterality: N/A;   RIGHT HEART CATH N/A 03/29/2019   Procedure: RIGHT HEART CATH;  Surgeon: Fuel Toribio SAUNDERS, MD;  Location: [facility] INVASIVE CV LAB;  Service: Cardiovascular;   Laterality: N/A;   TEE WITHOUT CARDIOVERSION N/A 10/02/2018   Procedure: TRANSESOPHAGEAL ECHOCARDIOGRAM (TEE);  Surgeon: Darron Deatrice LABOR, MD;  Location: [facility] ORS;  Service: Cardiovascular;  Laterality: N/A;   TOTAL KNEE ARTHROPLASTY Right 05/07/2022   Procedure: TOTAL KNEE ARTHROPLASTY;  Surgeon: Ernie Donnice, MD;  Location: [facility] ORS;  Service: Orthopedics;  Laterality: Right;   TUBAL LIGATION  1981   VAGINAL HYSTERECTOMY  01/2011   Fibroids/DUB.  Ovaries removed. Fontaine.    OB History   No obstetric history on file.      Home Medications    Prior to Admission medications   Medication Sig Start Date End Date Taking? Authorizing Provider  amoxicillin -clavulanate (AUGMENTIN ) 875-125 MG tablet Take 1 tablet by mouth every 12 (twelve) hours. 06/22/24  Yes Corlis Burnard DEL, NP  OZEMPIC , 0.25 OR 0.5 MG/DOSE, 2 MG/3ML [facility]  03/15/24  Yes [provider]  acetaminophen  (TYLENOL ) 500 MG tablet Take 2 tablets (1,000 mg total) by mouth every 6 (six) hours as needed for moderate pain or headache. 05/08/22   Patti Rosina SAUNDERS, PA-C  amiodarone  (PACERONE ) 200 MG tablet TAKE ONE TABLET BY MOUTH ONCE DAILY *PATIENT NEEDS APPT FOR FURTHER REFILLS* 04/07/24   Bensimhon, Toribio SAUNDERS, MD  atorvastatin  (LIPITOR) 10 MG tablet Take 1 tablet (10 mg total) by mouth at bedtime. 01/26/24   Cleatus Arlyss RAMAN, MD  carvedilol  (COREG ) 3.125 MG tablet Take 1 tablet (3.125 mg total) by mouth 2 (two) times daily. 07/29/23   Fernande Elspeth BROCKS, MD  clopidogrel  (PLAVIX ) 75 MG tablet TAKE ONE TABLET BY MOUTH ONCE A DAY 08/18/23   Fernande Elspeth BROCKS, MD  Continuous Blood Gluc Receiver (FREESTYLE LIBRE 2 READER) DEVI Use to check sugar multiple times per day as directed.  H/o DM2, treated with insulin . 03/08/22   Cleatus Arlyss RAMAN, MD  Continuous Glucose Sensor (FREESTYLE LIBRE 2 PLUS SENSOR) MISC APPLY ONE DEVICE TOPICALLY EVERY 15 DAYS 03/26/24   Cleatus Arlyss RAMAN, MD  Continuous Glucose Sensor (FREESTYLE LIBRE 2 SENSOR) MISC USE TO CHECK  BLOOD SUGARS MULTIPLE TIMES DAILY AS DIRECTED CHANGING SENSOR EVERY 14 DAYS DX:E11.9 12/09/23   Cleatus Arlyss RAMAN, MD  DULoxetine  (CYMBALTA ) 30 MG capsule Take 1 capsule (30 mg total) by mouth at bedtime. 12/30/23   Patel, Donika K, DO  fexofenadine  (ALLEGRA  ALLERGY) 60 MG tablet Take 1 tablet (60 mg total) by mouth daily. 05/11/24   Cleatus Arlyss RAMAN, MD  gabapentin  (NEURONTIN ) 600 MG tablet TAKE ONE TABLET BY IN THE MORNING AND TWO AT BEDTIME 05/24/24   Cleatus Arlyss RAMAN, MD  insulin  aspart (NOVOLOG  FLEXPEN) 100 UNIT/ML FlexPen INJECT 14 UNITS WITH BREAKFAST AND 5-8 UNITS WITH DINNER 02/11/24   Cleatus Arlyss RAMAN, MD  insulin  glargine (LANTUS  SOLOSTAR) 100 UNIT/ML Solostar Pen 10 units daily in PM 06/10/24   Cleatus Arlyss RAMAN, MD  Insulin  Pen Needle (PEN NEEDLES) 32G X 6 MM MISC 1 each by Does not apply route 4 (four) times daily. 02/03/23   Cleatus Arlyss RAMAN, MD  Levothyroxine  Sodium 37.5 MCG CAPS Take 1 tablet by mouth daily. 09/16/23   Cleatus Arlyss RAMAN, MD  Multiple Vitamin (MULTIVITAMIN WITH MINERALS) TABS tablet Take 1 tablet by mouth every morning.    [provider]  nitroGLYCERIN  (NITROSTAT ) 0.4 MG SL tablet Place 1 tablet (0.4 mg total) under the tongue every 5 (five) minutes as needed for chest pain. 01/01/23   Milford, Harlene HERO, FNP  Omega-3 Fatty Acids (FISH OIL) 1000 MG CAPS Take 1,000 mg by mouth 2 (two) times a day.    [provider]  [facility] ULTRA test strip USE AS INSTRUCTED TO CHECK BLOOD SUGAR 3TIMES DAILY 06/14/22   Cleatus Arlyss RAMAN, MD  polyethylene glycol (MIRALAX  / GLYCOLAX ) 17 g packet Take 17 g by mouth daily as needed for mild constipation. 05/08/22   Patti Rosina SAUNDERS, PA-C  torsemide  (DEMADEX ) 20 MG tablet TAKE ONE TABLET BY MOUTH TWICE A DAY 05/17/24   Bensimhon, Toribio SAUNDERS, MD  traMADol  (ULTRAM ) 50 MG tablet TAKE ONE (1) TO TWO (2) TABLETS (50-100 MG TOTAL) BY MOUTH AT BEDTIME AS NEEDED. 06/17/24   Cleatus Arlyss RAMAN, MD    Family History Family History  Problem Relation  Age of Onset   Diabetes Mother    Hypertension Mother    Arthritis Mother        knees, lumbar DDD, cervical  DDD   Cancer Father        prostate,skin,lymphoma.   Cancer Brother 62       bladder cancer; non-smoker   Obesity Brother    Diabetes Son    Hypertension Son    Diabetes Maternal Grandmother    Heart disease Maternal Grandmother    COPD Maternal Grandmother    Cancer Maternal Grandfather    Diabetes Paternal Grandmother    Diabetes Paternal Grandfather    Colon cancer Other    Breast cancer Other     Social History Social History   Tobacco Use   Smoking status: Never   Smokeless tobacco: Never  Vaping Use   Vaping status: Never Used  Substance Use Topics   Alcohol  use: No    Alcohol /week: 0.0 standard drinks of alcohol    Drug use: No     Allergies   Heparin  and Nsaids   Review of Systems Review of Systems  Constitutional:  Negative for chills and fever.  Musculoskeletal:  Negative for arthralgias and joint swelling.  Skin:  Positive for color change and wound.  Neurological:  Negative for weakness and numbness.     Physical Exam Triage Vital Signs ED Triage Vitals  Encounter Vitals Group     BP      Girls Systolic BP Percentile      Girls Diastolic BP Percentile      Boys Systolic BP Percentile      Boys Diastolic BP Percentile      Pulse      Resp      Temp      Temp src      SpO2      Weight      Height      Head Circumference      Peak Flow      Pain Score      Pain Loc      Pain Education      Exclude from Growth Chart    No data found.  Updated Vital Signs BP 115/74 (BP Location: Left Arm)   Pulse 79   Temp 98 F (36.7 C) (Oral)   Resp 18   Ht 5' 8 (1.727 m)   Wt 250 lb (113.4 kg)   SpO2 99%   BMI 38.01 kg/m   Visual Acuity Right Eye Distance:   Left Eye Distance:   Bilateral Distance:    Right Eye Near:   Left Eye Near:    Bilateral Near:     Physical Exam Constitutional:      General: She is not in acute  distress. HENT:     Mouth/Throat:     Mouth: Mucous membranes are moist.  Cardiovascular:     Rate and Rhythm: Normal rate and regular rhythm.  Pulmonary:     Effort: Pulmonary effort is normal. No respiratory distress.  Musculoskeletal:        General: Swelling and tenderness present. No deformity. Normal range of motion.  Skin:    General: Skin is warm and dry.     Capillary Refill: Capillary refill takes less than 2 seconds.     Findings: Erythema and lesion present.     Comments: Small puncture wounds on right wrist with tenderness, swelling, mild erythema.  No bleeding or drainage.  2+ radial pulse, FROM, strength 5/5, sensation intact.  Neurological:     General: No focal deficit present.     Mental Status: She is alert.     Sensory: No sensory  deficit.     Motor: No weakness.      UC Treatments / Results  Labs (all labs ordered are listed, but only abnormal results are displayed) Labs Reviewed - No data to display  EKG   Radiology DG Wrist Complete Right Result Date: 06/22/2024 CLINICAL DATA:  Right wrist pain and swelling after dog bite 3 days ago. EXAM: RIGHT WRIST - COMPLETE 3+ VIEW COMPARISON:  None Available. FINDINGS: There is no evidence of fracture or dislocation. There is no evidence of arthropathy or other focal bone abnormality. Soft tissues are unremarkable. IMPRESSION: Negative. Electronically Signed   By: Lynwood Landy Raddle M.D.   On: 06/22/2024 15:54    Procedures Procedures (including critical care time)  Medications Ordered in UC Medications - No data to display  Initial Impression / Assessment and Plan / UC Course  I have reviewed the triage vital signs and the nursing notes.  Pertinent labs & imaging results that were available during my care of the patient were reviewed by me and considered in my medical decision making (see chart for details).   Puncture wound of right wrist due to dog bite, pain and swelling of right wrist.  Afebrile and vital  signs are stable.  The dog belongs to the patient's mother and bit her as she was trying to pick it up.  The dog is up-to-date on its vaccinations.  Patient's last tetanus was in 2024.  Treating today with Augmentin .  Xray of right wrist negative.  Instructed patient to follow-up with her PCP tomorrow.  ED precautions given.  Education provided on animal bites.  She agrees to plan of care.  Final Clinical Impressions(s) / UC Diagnoses   Final diagnoses:  Puncture wound of right wrist with complication, initial encounter  Dog bite of right wrist with infection, initial encounter  Pain and swelling of right wrist     Discharge Instructions      Go to [facility] for your x-ray.    [address] [facility] [address]  [address] [place], [place] [address] [phone]  I will call you with the results.   Take the Augmentin  as directed.    Follow up with your primary care provider tomorrow.  Go to the emergency department if you have worsening symptoms.        ED Prescriptions     Medication Sig Dispense Auth. Provider   amoxicillin -clavulanate (AUGMENTIN ) 875-125 MG tablet Take 1 tablet by mouth every 12 (twelve) hours. 14 tablet Corlis Burnard DEL, NP      PDMP not reviewed this encounter.   Corlis Burnard DEL, NP 06/22/24 1606

## 2024-06-22 NOTE — Telephone Encounter (Signed)
 Noted. Thanks.

## 2024-06-22 NOTE — Telephone Encounter (Signed)
 FYI Only or Action Required?: FYI only for provider.  Patient was last seen in primary care on 03/04/2024 by Watt Mirza, MD.  Called Nurse Triage reporting Animal Bite.  Symptoms began several days ago.  Interventions attempted: Nothing.  Symptoms are: gradually worsening.  Triage Disposition: See HCP Within 4 Hours (Or PCP Triage) - patient going to UC  Patient/caregiver understands and will follow disposition?: Yes Reason for Disposition  [1] Puncture wound or small cut AND [2] on hands or genitals  (Exception: Puncture  from small pet such as gerbil, mouse, hamster, puppy or turtle.)  Answer Assessment - Initial Assessment Questions Patient was bitten by her mother's small dog. No in office same day availability. Patient agreed to be seen at [facility]. Provided with closest [facility] location and their hours of operation and phone number.  1. APPEARANCE What does it look like?  (e.g., abrasion, bruise, puncture)      Swollen  3. LOCATION: Where is the bite located?      Right wrist  4. ONSET: When did the bite happen? (e.g., minutes, hours ago)      06/20/24  5. ANIMAL: What type of animal caused the bite? Is the injury from a bite or a claw? If the animal is a dog or a cat, ask: Was it a pet or a stray? Was it acting ill or behaving strangely?     Small dog, UTD on Rabies  6. RABIES VACCINE: For dog or cat bites, ask: Do you know if the pet is vaccinated against rabies?  (e.g., yes, no, overdue for rabies shot, unknown)     Yes  8. TETANUS: When was your last tetanus booster?     2024  Protocols used: Animal Bite-A-AH Copied from CRM [id number]. Topic: Clinical - Red Word Triage >> Jun 22, 2024 11:23 AM Thersia BROCKS wrote: Red Word that prompted transfer to Nurse Triage: Patient called in stated her mom dog bit her hand is swelling and hurting

## 2024-06-22 NOTE — Telephone Encounter (Signed)
 Attempted to call patient twice to discuss the negative x-ray of her right wrist.  No answer and no voicemail picks up.

## 2024-06-22 NOTE — ED Triage Notes (Addendum)
 Patient in office today with mom complaint of dog bite to right wrist area 4 puncture wounds red and swollen with pain x 3d. Owner of dog is patients' mom whom stated he is up to date on shots.  Last booster 2024 OTC: alcohol   Denies Fever, n/v

## 2024-07-08 NOTE — Progress Notes (Signed)
 Remote ICD Transmission

## 2024-07-09 ENCOUNTER — Ambulatory Visit
Admission: EM | Admit: 2024-07-09 | Discharge: 2024-07-09 | Disposition: A | Discharge status: Home / Self Care | Attending: Emergency Medicine | Admitting: Emergency Medicine

## 2024-07-09 DIAGNOSIS — L03114 Cellulitis of left upper limb: Secondary | ICD-10-CM | POA: Diagnosis not present

## 2024-07-09 DIAGNOSIS — T22212A Burn of second degree of left forearm, initial encounter: Secondary | ICD-10-CM

## 2024-07-09 MED ORDER — DOXYCYCLINE HYCLATE 100 MG PO CAPS: 100.0000 mg | ORAL_CAPSULE | Freq: Two times a day (BID) | ORAL | 0 refills | Status: AC

## 2024-07-09 MED ORDER — SILVER SULFADIAZINE 1 % EX CREA: TOPICAL_CREAM | Freq: Every day | CUTANEOUS | Status: DC

## 2024-07-09 MED ORDER — SILVER SULFADIAZINE 1 % EX CREA: 1.0000 | TOPICAL_CREAM | Freq: Every day | CUTANEOUS | 0 refills | Status: AC

## 2024-07-09 NOTE — ED Triage Notes (Addendum)
 Pt being seen in UC for check of L arm burn after noticing swelling and oozing. Pt reports burn happened when cooking on Saturday. Pt denies fevers. Pt reports keeping area clean and using ointment to help. Pulses and sensation intact. Pt denies pain, but reports some burning/stinging.

## 2024-07-09 NOTE — ED Provider Notes (Signed)
 [name]    CSN: [id number] Arrival date & time: 07/09/24  1515      History   Chief Complaint Chief Complaint  Patient presents with   Burn    HPI [name] is a 68 y.o. female.  Accompanied by her mother, patient presents with a burn on her left forearm that occurred 6 days ago.  She was cooking and had hot grease splashed up onto her arm.  Initially, she treated it with cold running water and then applied mayonnaise.  The burn developed blisters which have since ruptured.  She has been using topical antibiotic ointment recently.  The area around the burn has gotten red and swollen.  No purulent drainage.  No fever or chills.  Patient was recently on Augmentin  following a dog bite.  The history is provided by the patient and medical records.    Past Medical History:  Diagnosis Date   Acute myocardial infarction, subendocardial infarction, initial episode of care (HCC) 07/21/2012   Acute osteomyelitis involving ankle and foot (HCC) 02/06/2015   Acute systolic heart failure (HCC) 07/21/2012   New onset 07/19/12; admission to [facility] ED. Elevated Troponins.  S/p 2D-echo with EF 20-25%.  S/p cardiac catheterization with stenting LAD.  Repeat 2D-echo 10/2011 with improved EF of 35%.    Anemia    Arthritis    Automatic implantable cardioverter-defibrillator in situ    a. MDT CRT-D 06/2014, SN: [id number]  .removed in feb 2020   CAD (coronary artery disease)    a. cardiac cath 101/04/2012: PCI/DES to chronically occluded mLAD, consideration PCI to diag branch in 4 weeks.    Cataract    Chicken pox    Chronic kidney disease    Chronic systolic CHF (congestive heart failure) (HCC)    a. mixed ICM & NICM; b. EF 20-25% by echo 07/2012, mid-dist 2/3 of LV sev HK/AK, mild MR. echo 10/2012: EF 30-35%, sev HK ant-septal & inf walls, GR1DD, mild MR, PASP 33. c. echo 02/2013: EF 30%, GR1DD, mild MR. echo 04/2014: EF 30%, Septal-lat dyssynchrony, global HK, inf AK, GR1DD, mild  MR. d. echo 10/2014: EF50-55%, WM nl, GR1DD, septal mild paradox. e. echo 02/2015: EF 50-55%, wm    Depression    Heart attack (HCC)    Heel ulcer (HCC) 04/27/2015   History of blood transfusion ~ 2011   plasma; had neuropathy; couldn't walk   Hypertension    Hypothyroidism    LBBB (left bundle branch block)    Neuromuscular disorder (HCC)    Neuropathy 2011   Obesity, unspecified    OSA on CPAP    Moderate with AHI 23/hr and now on CPAP at 16cm H2O.  Her DME is [facility]   Pure hypercholesterolemia    Sepsis (HCC) 09/2018   Type II diabetes mellitus (HCC)    Unspecified vitamin D  deficiency     Patient Active Problem List   Diagnosis Date Noted   PVC (premature ventricular contraction) 04/21/2024   Nasal congestion 12/23/2023   Healthcare maintenance 09/17/2023   Hypothyroidism 09/17/2023   Foot drop 09/17/2023   NICM (nonischemic cardiomyopathy) (HCC) 09/08/2023   Orthostatic hypotension 09/08/2023   History of myocardial infarction 07/05/2022   Spondylosis without myelopathy 07/05/2022   Lumbar radiculopathy 06/18/2022   S/P total knee arthroplasty, right 05/07/2022   Knee pain 04/24/2022   Localized osteoarthritis of knees, bilateral 07/25/2021   Medication monitoring encounter 03/31/2021   Osteomyelitis (HCC) 02/18/2021   CKD (chronic kidney disease) stage 4,  GFR 15-29 ml/min (HCC) 01/28/2021   AICD (automatic cardioverter/defibrillator) present 01/28/2021   Diabetic foot ulcer (HCC) 11/24/2020   Advance care planning 10/04/2020   Secondary hyperparathyroidism of renal origin 07/20/2019   Benign hypertensive kidney disease with chronic kidney disease 07/20/2019   Anemia in chronic kidney disease 07/20/2019   Positive antinuclear antibody 07/20/2019   Proteinuria 07/20/2019   Acute on chronic systolic (congestive) heart failure (HCC) 03/23/2019   CAD (coronary artery disease) 03/09/2019   DM (diabetes mellitus), type 2 with renal complications (HCC) 03/09/2019   Atrial  tachycardia    Acute kidney injury superimposed on chronic kidney disease    AKI (acute kidney injury)    Shortness of breath 12/22/2018   Palliative care encounter 12/22/2018   NSTEMI (non-ST elevated myocardial infarction) (HCC) 11/22/2018   Acute on chronic combined systolic and diastolic CHF (congestive heart failure) (HCC) 10/28/2018   Lymphedema 10/28/2018   Severe sepsis (HCC) 09/28/2018   History of amputation of lesser toe of right foot 04/19/2018   Presence of cardiac resynchronization therapy defibrillator (CRT-D) 04/19/2018   Paraparesis of both lower limbs (HCC) 03/29/2018   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   Paroxysmal atrial fibrillation (HCC) 03/29/2018   Class 3 obesity due to excess calories with serious comorbidity and body mass index (BMI) of 40.0 to 44.9 in adult 09/02/2016   History of CVA (cerebrovascular accident) 11/11/2015   Chronic renal insufficiency 08/04/2015   OSA (obstructive sleep apnea) 08/15/2014   Morbid obesity (HCC) 10/26/2013   Essential hypertension, benign 04/29/2013   Pure hypercholesterolemia 12/07/2012   Iron  deficiency anemia 12/07/2012   Diabetic peripheral neuropathy associated with type 2 diabetes mellitus (HCC) 12/07/2012   Chronic systolic congestive heart failure (HCC) 07/29/2012   Left bundle-branch block 07/25/2012   Coronary artery disease involving native coronary artery of native heart without angina pectoris 07/25/2012   Heart failure with improved ejection fraction (HFimpEF) (HCC) 07/21/2012   Hyperlipidemia 07/21/2012    Past Surgical History:  Procedure Laterality Date   ABDOMINAL AORTOGRAM W/LOWER EXTREMITY N/A 02/02/2021   Procedure: ABDOMINAL AORTOGRAM W/LOWER EXTREMITY;  Surgeon: Eliza Lonni RAMAN, MD;  Location: [facility] INVASIVE CV LAB;  Service: Cardiovascular;  Laterality: N/A;   AMPUTATION Left 03/02/2021   Procedure: AMPUTATION RAY, left great toe;  Surgeon: Gershon Donnice SAUNDERS, DPM;  Location: [facility] OR;  Service:  Podiatry;  Laterality: Left;   AMPUTATION TOE Right 06/18/2015   Procedure: AMPUTATION TOE;  Surgeon: Eva Gay, DPM;  Location: [facility] ORS;  Service: Podiatry;  Laterality: Right;   AMPUTATION TOE Left 10/08/2018   Procedure: AMPUTATION TOE LEFT 2ND;  Surgeon: Lilli Donnice, DPM;  Location: [facility] ORS;  Service: Podiatry;  Laterality: Left;   BACK SURGERY     BI-VENTRICULAR IMPLANTABLE CARDIOVERTER DEFIBRILLATOR N/A 07/06/2014   Procedure: BI-VENTRICULAR IMPLANTABLE CARDIOVERTER DEFIBRILLATOR  (CRT-D);  Surgeon: Elspeth JAYSON Sage, MD;  Location: [facility] CATH LAB;  Service: Cardiovascular;  Laterality: N/A;   BI-VENTRICULAR IMPLANTABLE CARDIOVERTER DEFIBRILLATOR  (CRT-D)  07/06/2014   BILATERAL OOPHORECTOMY  01/2011   ovarian cyst benign   BIV ICD INSERTION CRT-D N/A 03/31/2019   Procedure: BIV ICD INSERTION CRT-D;  Surgeon: Inocencio Soyla Lunger, MD;  Location: [facility] INVASIVE CV LAB;  Service: Cardiovascular;  Laterality: N/A;   COLONOSCOPY WITH PROPOFOL  Left 02/22/2015   Procedure: COLONOSCOPY WITH PROPOFOL ;  Surgeon: Deward CINDERELLA Piedmont, MD;  Location: [facility] ENDOSCOPY;  Service: Endoscopy;  Laterality: Left;   CORONARY ANGIOPLASTY WITH STENT PLACEMENT Left 07/2012   new onset systolic CHF; elevated troponins.  Cardiac  catheterization with stenting to LAD; EF 15%.  2D-echo: EF 20-25%.   ESOPHAGOGASTRODUODENOSCOPY N/A 02/22/2015   Procedure: ESOPHAGOGASTRODUODENOSCOPY (EGD);  Surgeon: Deward CINDERELLA Piedmont, MD;  Location: [facility] ENDOSCOPY;  Service: Endoscopy;  Laterality: N/A;   I & D EXTREMITY Left 11/28/2020   Procedure: IRRIGATION AND DEBRIDEMENT OF FOOT;  Surgeon: Gershon Donnice SAUNDERS, DPM;  Location: [facility] OR;  Service: Podiatry;  Laterality: Left;   I & D EXTREMITY Left 01/31/2021   Procedure: IRRIGATION AND DEBRIDEMENT EXTREMITY OF LEFT FOOT WITH BONE BIOPSY OF  1ST METATARSAL AND FIFTH METATARSAL;  Surgeon: Tobie Franky SQUIBB, DPM;  Location: [facility] OR;  Service: Podiatry;  Laterality: Left;   I & D EXTREMITY Left 02/03/2021   Procedure:  IRRIGATION AND DEBRIDEMENT EXTREMITY WITH INTERNAL AMPUTATION FIRST AND FIFTH RAY;  Surgeon: Tobie Franky SQUIBB, DPM;  Location: [facility] OR;  Service: Podiatry;  Laterality: Left;   INCISION AND DRAINAGE ABSCESS Right 2007   groin; with ICU stay due to sepsis.   IR FLUORO GUIDE CV LINE RIGHT  03/06/2021   IR REMOVAL TUN CV CATH W/O FL  04/27/2021   IR US  GUIDE VASC ACCESS RIGHT  03/06/2021   IRRIGATION AND DEBRIDEMENT FOOT Left 04/25/2021   Procedure: EXCISION AND DEBRIDEMENT WOUND ULCER LEFT FOOT; METATARSECTOMY;  Surgeon: Gershon Donnice SAUNDERS, DPM;  Location: [facility] ORS;  Service: Podiatry;  Laterality: Left;   LAPAROSCOPIC CHOLECYSTECTOMY  2011   LEFT HEART CATH AND CORONARY ANGIOGRAPHY N/A 10/05/2018   Procedure: LEFT HEART CATH AND CORONARY ANGIOGRAPHY;  Surgeon: Perla Evalene PARAS, MD;  Location: [facility] INVASIVE CV LAB;  Service: Cardiovascular;  Laterality: N/A;   LEFT HEART CATHETERIZATION WITH CORONARY ANGIOGRAM N/A 07/21/2012   Procedure: LEFT HEART CATHETERIZATION WITH CORONARY ANGIOGRAM;  Surgeon: Toribio SAUNDERS Fuel, MD;  Location: [facility] CATH LAB;  Service: Cardiovascular;  Laterality: N/A;   LUMBAR DISC SURGERY  1996   L4-5   PACEMAKER REMOVAL  11/2018   due to infection around pacemaker   PERCUTANEOUS CORONARY STENT INTERVENTION (PCI-S) N/A 07/23/2012   Procedure: PERCUTANEOUS CORONARY STENT INTERVENTION (PCI-S);  Surgeon: Ozell Fell, MD;  Location: [facility] CATH LAB;  Service: Cardiovascular;  Laterality: N/A;   PERIPHERAL VASCULAR BALLOON ANGIOPLASTY Left 10/06/2018   Procedure: PERIPHERAL VASCULAR BALLOON ANGIOPLASTY;  Surgeon: Jama Cordella MATSU, MD;  Location: [facility] INVASIVE CV LAB;  Service: Cardiovascular;  Laterality: Left;   PERIPHERAL VASCULAR CATHETERIZATION N/A 02/10/2015   Procedure: Picc Line Insertion;  Surgeon: Cordella MATSU Jama, MD;  Location: [facility] INVASIVE CV LAB;  Service: Cardiovascular;  Laterality: N/A;   RIGHT HEART CATH N/A 03/29/2019   Procedure: RIGHT HEART CATH;  Surgeon: Fuel Toribio SAUNDERS, MD;  Location: [facility] INVASIVE CV LAB;  Service: Cardiovascular;  Laterality: N/A;   TEE WITHOUT CARDIOVERSION N/A 10/02/2018   Procedure: TRANSESOPHAGEAL ECHOCARDIOGRAM (TEE);  Surgeon: Darron Deatrice LABOR, MD;  Location: [facility] ORS;  Service: Cardiovascular;  Laterality: N/A;   TOTAL KNEE ARTHROPLASTY Right 05/07/2022   Procedure: TOTAL KNEE ARTHROPLASTY;  Surgeon: Ernie Donnice, MD;  Location: [facility] ORS;  Service: Orthopedics;  Laterality: Right;   TUBAL LIGATION  1981   VAGINAL HYSTERECTOMY  01/2011   Fibroids/DUB.  Ovaries removed. [place].    OB History   No obstetric history on file.      Home Medications    Prior to Admission medications   Medication Sig Start Date End Date Taking? Authorizing Provider  doxycycline  (VIBRAMYCIN ) 100 MG capsule Take 1 capsule (100 mg total) by mouth 2 (two) times daily for 7 days.  07/09/24 07/16/24 Yes Corlis Burnard DEL, NP  silver sulfADIAZINE (SILVADENE) 1 % cream Apply 1 Application topically daily. 07/09/24  Yes Corlis Burnard DEL, NP  acetaminophen  (TYLENOL ) 500 MG tablet Take 2 tablets (1,000 mg total) by mouth every 6 (six) hours as needed for moderate pain or headache. 05/08/22   Patti Rosina SAUNDERS, PA-C  amiodarone  (PACERONE ) 200 MG tablet TAKE ONE TABLET BY MOUTH ONCE DAILY *PATIENT NEEDS APPT FOR FURTHER REFILLS* 04/07/24   Bensimhon, Toribio SAUNDERS, MD  amoxicillin -clavulanate (AUGMENTIN ) 875-125 MG tablet Take 1 tablet by mouth every 12 (twelve) hours. Patient not taking: Reported on 07/09/2024 06/22/24   Corlis Burnard DEL, NP  atorvastatin  (LIPITOR) 10 MG tablet Take 1 tablet (10 mg total) by mouth at bedtime. 01/26/24   Cleatus Arlyss RAMAN, MD  carvedilol  (COREG ) 3.125 MG tablet Take 1 tablet (3.125 mg total) by mouth 2 (two) times daily. 07/29/23   Fernande Elspeth BROCKS, MD  clopidogrel  (PLAVIX ) 75 MG tablet TAKE ONE TABLET BY MOUTH ONCE A DAY 08/18/23   Fernande Elspeth BROCKS, MD  Continuous Blood Gluc Receiver (FREESTYLE LIBRE 2 READER) DEVI Use to check sugar multiple times  per day as directed.  H/o DM2, treated with insulin . 03/08/22   Cleatus Arlyss RAMAN, MD  Continuous Glucose Sensor (FREESTYLE LIBRE 2 PLUS SENSOR) MISC APPLY ONE DEVICE TOPICALLY EVERY 15 DAYS 03/26/24   Cleatus Arlyss RAMAN, MD  Continuous Glucose Sensor (FREESTYLE LIBRE 2 SENSOR) MISC USE TO CHECK BLOOD SUGARS MULTIPLE TIMES DAILY AS DIRECTED CHANGING SENSOR EVERY 14 DAYS DX:E11.9 12/09/23   Cleatus Arlyss RAMAN, MD  DULoxetine  (CYMBALTA ) 30 MG capsule Take 1 capsule (30 mg total) by mouth at bedtime. 12/30/23   Patel, Donika K, DO  fexofenadine  (ALLEGRA  ALLERGY) 60 MG tablet Take 1 tablet (60 mg total) by mouth daily. 05/11/24   Cleatus Arlyss RAMAN, MD  gabapentin  (NEURONTIN ) 600 MG tablet TAKE ONE TABLET BY IN THE MORNING AND TWO AT BEDTIME 05/24/24   Cleatus Arlyss RAMAN, MD  insulin  aspart (NOVOLOG  FLEXPEN) 100 UNIT/ML FlexPen INJECT 14 UNITS WITH BREAKFAST AND 5-8 UNITS WITH DINNER 02/11/24   Cleatus Arlyss RAMAN, MD  insulin  glargine (LANTUS  SOLOSTAR) 100 UNIT/ML Solostar Pen 10 units daily in PM 06/10/24   Cleatus Arlyss RAMAN, MD  Insulin  Pen Needle (PEN NEEDLES) 32G X 6 MM MISC 1 each by Does not apply route 4 (four) times daily. 02/03/23   Cleatus Arlyss RAMAN, MD  Levothyroxine  Sodium 37.5 MCG CAPS Take 1 tablet by mouth daily. 09/16/23   Cleatus Arlyss RAMAN, MD  Multiple Vitamin (MULTIVITAMIN WITH MINERALS) TABS tablet Take 1 tablet by mouth every morning.    [provider]  nitroGLYCERIN  (NITROSTAT ) 0.4 MG SL tablet Place 1 tablet (0.4 mg total) under the tongue every 5 (five) minutes as needed for chest pain. 01/01/23   [place]ford, Harlene HERO, FNP  Omega-3 Fatty Acids (FISH OIL) 1000 MG CAPS Take 1,000 mg by mouth 2 (two) times a day.    [provider]  [facility] ULTRA test strip USE AS INSTRUCTED TO CHECK BLOOD SUGAR 3TIMES DAILY 06/14/22   Cleatus Arlyss RAMAN, MD  OZEMPIC , 0.25 OR 0.5 MG/DOSE, 2 MG/3ML [facility]  03/15/24   [provider]  polyethylene glycol (MIRALAX  / GLYCOLAX ) 17 g packet Take 17 g by mouth daily  as needed for mild constipation. 05/08/22   Patti Rosina SAUNDERS, PA-C  torsemide  (DEMADEX ) 20 MG tablet TAKE ONE TABLET BY MOUTH TWICE A DAY 05/17/24   Bensimhon, Toribio SAUNDERS, MD  traMADol  (ULTRAM ) 50 MG tablet TAKE ONE (1) TO TWO (2) TABLETS (50-100 MG TOTAL) BY MOUTH AT BEDTIME AS NEEDED. 06/17/24   Cleatus Arlyss RAMAN, MD    Family History Family History  Problem Relation Age of Onset   Diabetes Mother    Hypertension Mother    Arthritis Mother        knees, lumbar DDD, cervical DDD   Cancer Father        prostate,skin,lymphoma.   Cancer Brother 7       bladder cancer; non-smoker   Obesity Brother    Diabetes Son    Hypertension Son    Diabetes Maternal Grandmother    Heart disease Maternal Grandmother    COPD Maternal Grandmother    Cancer Maternal Grandfather    Diabetes Paternal Grandmother    Diabetes Paternal Grandfather    Colon cancer Other    Breast cancer Other     Social History Social History   Tobacco Use   Smoking status: Never   Smokeless tobacco: Never  Vaping Use   Vaping status: Never Used  Substance Use Topics   Alcohol  use: No    Alcohol /week: 0.0 standard drinks of alcohol    Drug use: No     Allergies   Heparin  and Nsaids   Review of Systems Review of Systems  Constitutional:  Negative for chills and fever.  Musculoskeletal:  Negative for arthralgias and joint swelling.  Skin:  Positive for color change and wound.  Neurological:  Negative for weakness and numbness.     Physical Exam Triage Vital Signs ED Triage Vitals  Encounter Vitals Group     BP 07/09/24 1533 123/69     Girls Systolic BP Percentile --      Girls Diastolic BP Percentile --      Boys Systolic BP Percentile --      Boys Diastolic BP Percentile --      Pulse Rate 07/09/24 1533 74     Resp 07/09/24 1533 18     Temp 07/09/24 1533 98 F (36.7 C)     Temp src --      SpO2 07/09/24 1533 99 %     Weight --      Height --      Head Circumference --      Peak Flow --       Pain Score 07/09/24 1542 3     Pain Loc --      Pain Education --      Exclude from Growth Chart --    No data found.  Updated Vital Signs BP 123/69   Pulse 74   Temp 98 F (36.7 C)   Resp 18   SpO2 99%   Visual Acuity Right Eye Distance:   Left Eye Distance:   Bilateral Distance:    Right Eye Near:   Left Eye Near:    Bilateral Near:     Physical Exam Constitutional:      General: She is not in acute distress. HENT:     Mouth/Throat:     Mouth: Mucous membranes are moist.  Cardiovascular:     Rate and Rhythm: Normal rate and regular rhythm.  Pulmonary:     Effort: Pulmonary effort is normal. No respiratory distress.  Musculoskeletal:        General: No deformity. Normal range of motion.  Skin:    General: Skin is warm and dry.     Capillary Refill: Capillary refill takes less than 2  seconds.     Comments: Second-degree burn on left forearm with surrounding cellulitis.  No purulent drainage.  See picture for details.  Neurological:     General: No focal deficit present.     Mental Status: She is alert.     Sensory: No sensory deficit.     Motor: No weakness.      UC Treatments / Results  Labs (all labs ordered are listed, but only abnormal results are displayed) Labs Reviewed - No data to display  EKG   Radiology No results found.  Procedures Procedures (including critical care time)  Medications Ordered in UC Medications  silver sulfADIAZINE (SILVADENE) 1 % cream ( Topical Given 07/09/24 1603)    Initial Impression / Assessment and Plan / UC Course  I have reviewed the triage vital signs and the nursing notes.  Pertinent labs & imaging results that were available during my care of the patient were reviewed by me and considered in my medical decision making (see chart for details).    Partial-thickness burn of left forearm with cellulitis.  Afebrile and vital signs are stable.  No fever or purulent drainage.  The burn occurred 6 days ago when  she accidentally splashed grease onto her arm while cooking.  Treating today with Silvadene.  Also treating with 7-day course of doxycycline  (patient was recently on Augmentin ; has decreased renal function).  Education provided on burn care and cellulitis.  Instructed her to follow-up with her PCP on Monday.  Strict ED precautions discussed.  Patient agrees to plan of care.  Final Clinical Impressions(s) / UC Diagnoses   Final diagnoses:  Partial thickness burn of left forearm, initial encounter  Cellulitis of forearm, left     Discharge Instructions      Apply the Silvadene as directed.  Take the antibiotic as directed.  Follow-up with your primary care provider on Monday.  Go to the emergency department if you have worsening symptoms.     ED Prescriptions     Medication Sig Dispense Auth. Provider   silver sulfADIAZINE (SILVADENE) 1 % cream Apply 1 Application topically daily. 50 g Corlis Burnard DEL, NP   doxycycline  (VIBRAMYCIN ) 100 MG capsule Take 1 capsule (100 mg total) by mouth 2 (two) times daily for 7 days. 14 capsule Corlis Burnard DEL, NP      PDMP not reviewed this encounter.   Corlis Burnard DEL, NP 07/09/24 [phone]

## 2024-07-09 NOTE — Discharge Instructions (Addendum)
 Apply the Silvadene as directed.  Take the antibiotic as directed.  Follow-up with your primary care provider on Monday.  Go to the emergency department if you have worsening symptoms.

## 2024-07-12 ENCOUNTER — Ambulatory Visit: Payer: Self-pay

## 2024-07-12 ENCOUNTER — Encounter: Payer: Self-pay | Admitting: Internal Medicine

## 2024-07-12 NOTE — Telephone Encounter (Signed)
 FYI Only or Action Required?: FYI only for provider.  Patient was last seen in primary care on 03/04/2024 by Watt Mirza, MD.  Called Nurse Triage reporting Burn.  Symptoms began a week ago.  Interventions attempted: Went to UC. Pt was given ointment and was told to follow up with PCP.  Symptoms are: unchanged.  Triage Disposition: See Physician Within 24 Hours  Patient/caregiver understands and will follow disposition?: Yes           Copied from CRM #[id number]. Topic: Clinical - Red Word Triage >> Jul 12, 2024 10:34 AM Amy B wrote: Red Word that prompted transfer to Nurse Triage: burn left forearm Reason for Disposition  [1] Broken (ruptured) blister AND [2] caller doesn't want to remove the dead skin  (Exception: Blister 1/2 inch [12 mm] or smaller.)  Answer Assessment - Initial Assessment Questions 1. ONSET: When did it happen? If happened < 3 hours ago, ask: Did you apply cool water? If not, give First Aid Advice immediately.      Last Saturday 2. LOCATION: Where is the burn located?      Left forearm 3. BURN SIZE: How large is the burn?  The palm is roughly 0.5% of the total body surface area (BSA).     6 inches by 4 inches 4. SEVERITY OF THE BURN: Are there any blisters? What size are they? (e.g., quarter equals 1 inch or 2.5 cm) Are any of the blisters broken (open or wrinkled)?     Blisters have opened 5. MECHANISM: Tell me how it happened.     Gravy 6. PAIN: Are you having any pain? How bad is the pain? (Scale 0-10; or none, mild, moderate, severe)     Stings and burns 7. INHALATION INJURY: Were you inside an enclosed space with heat and smoke? If Yes, ask: Do you have any cough or difficulty breathing?     no 8. OTHER SYMPTOMS: Do you have any other symptoms? (e.g., headache, nausea)     no 9. PREGNANCY: Is there any chance you are pregnant? When was your last menstrual period?     no  Protocols used: [facility]

## 2024-07-12 NOTE — Telephone Encounter (Signed)
Will see tomorrow at [place].  Thanks.

## 2024-07-12 NOTE — Telephone Encounter (Signed)
 [name]

## 2024-07-13 ENCOUNTER — Encounter: Payer: Self-pay | Admitting: Pharmacist

## 2024-07-13 ENCOUNTER — Encounter: Payer: Self-pay | Admitting: Family Medicine

## 2024-07-13 ENCOUNTER — Ambulatory Visit: Admitting: Family Medicine

## 2024-07-13 VITALS — HR 74 | Temp 98.7°F | Ht 68.0 in | Wt 259.4 lb

## 2024-07-13 DIAGNOSIS — E1122 Type 2 diabetes mellitus with diabetic chronic kidney disease: Secondary | ICD-10-CM | POA: Diagnosis not present

## 2024-07-13 DIAGNOSIS — Z794 Long term (current) use of insulin: Secondary | ICD-10-CM | POA: Diagnosis not present

## 2024-07-13 DIAGNOSIS — T22212D Burn of second degree of left forearm, subsequent encounter: Secondary | ICD-10-CM

## 2024-07-13 LAB — POCT GLYCOSYLATED HEMOGLOBIN (HGB A1C): Hemoglobin A1C: 6.5 % — AB (ref 4.0–5.6)

## 2024-07-13 MED ORDER — SEMAGLUTIDE (1 MG/DOSE) 4 MG/3ML ~~LOC~~ SOPN: 1.0000 mg | PEN_INJECTOR | SUBCUTANEOUS | Status: DC

## 2024-07-13 MED ORDER — LANTUS SOLOSTAR 100 UNIT/ML ~~LOC~~ SOPN: PEN_INJECTOR | SUBCUTANEOUS | Status: AC

## 2024-07-13 NOTE — Patient Instructions (Addendum)
 Keep the area clean and dry.  Continue with silvadene until healed.  Update me as needed.  Decrease lantus  to 8 units.   Plan on recheck in about 3 months.  Labs at the visit. You don't have to fast.  Take care.  Glad to see you.

## 2024-07-13 NOTE — Telephone Encounter (Signed)
 error

## 2024-07-13 NOTE — Progress Notes (Unsigned)
 Defer BP check today with burn on L arm and sugar meter on the R arm.  Discussed.  She was making gravy and got burned.  Seen at [facility].  Using silvadene and doxycycline .  Improving in the meantime.  See exam.  Diabetes:  Using medications without difficulties: Hypoglycemic episodes: see below.  Cautions d/w pt.  Hyperglycemic episodes: see below.   Blood Sugars averaging: 50-240s.   eye exam within last year: yes A1c 6.5.  Discussed with patient at office visit.  D/w pt about dec in lantus  to 8 units.   PMH and SH reviewed  Meds, vitals, and allergies reviewed.   ROS: Per HPI unless specifically indicated in ROS section   GEN: nad, alert and oriented HEENT: ncat NECK: supple w/o LA CV: rrr. PULM: ctab, no inc wob ABD: soft, +bs EXT: no edema SKIN: well perfused.  She had a previous 15 cm area in maximum diameter on the left forearm that was affected.  This is healing over with a 3 x 2 cm affected area now with superficial irritation from previous burn.  It does not appear infected.  35 minutes were devoted to patient care in this encounter (this includes time spent reviewing the patient's file/history, interviewing and examining the patient, counseling/reviewing plan with patient).

## 2024-07-14 DIAGNOSIS — T2220XA Burn of second degree of shoulder and upper limb, except wrist and hand, unspecified site, initial encounter: Secondary | ICD-10-CM | POA: Insufficient documentation

## 2024-07-14 NOTE — Assessment & Plan Note (Signed)
 Discussed options. A1c 6.5.  Discussed with patient at office visit. History of lower sugars noted. D/w pt about dec in lantus  to 8 units.  Recheck periodically.  She agrees to plan.

## 2024-07-14 NOTE — Assessment & Plan Note (Signed)
 Clearly improving.  Keep it clean and covered.  Use nonstick bandage.  Redressed at office visit with nonstick bandage and Silvadene and then covered with rolled gauze and Coban.  Update me as needed.

## 2024-07-19 ENCOUNTER — Telehealth: Payer: Self-pay | Admitting: Cardiovascular Disease

## 2024-07-19 NOTE — Telephone Encounter (Signed)
Called patient. No answer and unable to leave message

## 2024-07-19 NOTE — Telephone Encounter (Signed)
 Patient c/o Palpitations:  STAT if patient reporting lightheadedness, shortness of breath, or chest pain  How long have you had palpitations/irregular HR/ Afib? Are you having the symptoms now?   No  Are you currently experiencing lightheadedness, SOB or CP?   No  Do you have a history of afib (atrial fibrillation) or irregular heart rhythm?   No  Have you checked your BP or HR? (document readings if available):    BP 96/60  HR 79  Are you experiencing any other symptoms?   Patient stated she has been feeling wore out and noted her heart starts racing when she goes to sit down.  Patient noted she has a pacemaker.

## 2024-07-22 ENCOUNTER — Other Ambulatory Visit: Payer: Self-pay

## 2024-07-22 ENCOUNTER — Emergency Department

## 2024-07-22 ENCOUNTER — Emergency Department
Admission: EM | Admit: 2024-07-22 | Discharge: 2024-07-22 | Disposition: A | Discharge status: Home / Self Care | Attending: Emergency Medicine | Admitting: Emergency Medicine

## 2024-07-22 DIAGNOSIS — Z95810 Presence of automatic (implantable) cardiac defibrillator: Secondary | ICD-10-CM | POA: Diagnosis not present

## 2024-07-22 DIAGNOSIS — I2510 Atherosclerotic heart disease of native coronary artery without angina pectoris: Secondary | ICD-10-CM | POA: Diagnosis not present

## 2024-07-22 DIAGNOSIS — E119 Type 2 diabetes mellitus without complications: Secondary | ICD-10-CM

## 2024-07-22 DIAGNOSIS — E039 Hypothyroidism, unspecified: Secondary | ICD-10-CM | POA: Insufficient documentation

## 2024-07-22 DIAGNOSIS — R0600 Dyspnea, unspecified: Secondary | ICD-10-CM

## 2024-07-22 DIAGNOSIS — N184 Chronic kidney disease, stage 4 (severe): Secondary | ICD-10-CM | POA: Insufficient documentation

## 2024-07-22 DIAGNOSIS — E041 Nontoxic single thyroid nodule: Secondary | ICD-10-CM | POA: Diagnosis not present

## 2024-07-22 DIAGNOSIS — I110 Hypertensive heart disease with heart failure: Secondary | ICD-10-CM | POA: Insufficient documentation

## 2024-07-22 DIAGNOSIS — I482 Chronic atrial fibrillation, unspecified: Secondary | ICD-10-CM | POA: Diagnosis not present

## 2024-07-22 DIAGNOSIS — E1122 Type 2 diabetes mellitus with diabetic chronic kidney disease: Secondary | ICD-10-CM | POA: Diagnosis not present

## 2024-07-22 DIAGNOSIS — Z794 Long term (current) use of insulin: Secondary | ICD-10-CM | POA: Diagnosis not present

## 2024-07-22 DIAGNOSIS — R002 Palpitations: Secondary | ICD-10-CM | POA: Diagnosis not present

## 2024-07-22 DIAGNOSIS — Z96651 Presence of right artificial knee joint: Secondary | ICD-10-CM | POA: Diagnosis not present

## 2024-07-22 DIAGNOSIS — N189 Chronic kidney disease, unspecified: Secondary | ICD-10-CM | POA: Diagnosis not present

## 2024-07-22 DIAGNOSIS — I5022 Chronic systolic (congestive) heart failure: Secondary | ICD-10-CM | POA: Insufficient documentation

## 2024-07-22 DIAGNOSIS — I129 Hypertensive chronic kidney disease with stage 1 through stage 4 chronic kidney disease, or unspecified chronic kidney disease: Secondary | ICD-10-CM | POA: Diagnosis not present

## 2024-07-22 DIAGNOSIS — R0602 Shortness of breath: Secondary | ICD-10-CM | POA: Diagnosis not present

## 2024-07-22 DIAGNOSIS — I130 Hypertensive heart and chronic kidney disease with heart failure and stage 1 through stage 4 chronic kidney disease, or unspecified chronic kidney disease: Secondary | ICD-10-CM | POA: Diagnosis not present

## 2024-07-22 LAB — BASIC METABOLIC PANEL WITH GFR
Anion gap: 16 — ABNORMAL HIGH (ref 5–15)
BUN: 59 mg/dL — ABNORMAL HIGH (ref 8–23)
CO2: 26 mmol/L (ref 22–32)
Calcium: 10 mg/dL (ref 8.9–10.3)
Chloride: 100 mmol/L (ref 98–111)
Creatinine, Ser: 2.12 mg/dL — ABNORMAL HIGH (ref 0.44–1.00)
GFR, Estimated: 25 mL/min — ABNORMAL LOW (ref >60)
Glucose, Bld: 91 mg/dL (ref 70–99)
Potassium: 5.7 mmol/L — ABNORMAL HIGH (ref 3.5–5.1)
Sodium: 142 mmol/L (ref 135–145)

## 2024-07-22 LAB — CBC
HCT: 46.8 % — ABNORMAL HIGH (ref 36.0–46.0)
Hemoglobin: 15.3 g/dL — ABNORMAL HIGH (ref 12.0–15.0)
MCH: 29.6 pg (ref 26.0–34.0)
MCHC: 32.7 g/dL (ref 30.0–36.0)
MCV: 90.5 fL (ref 80.0–100.0)
Platelets: 204 K/uL (ref 150–400)
RBC: 5.17 MIL/uL — ABNORMAL HIGH (ref 3.87–5.11)
RDW: 12.9 % (ref 11.5–15.5)
WBC: 9.9 K/uL (ref 4.0–10.5)
nRBC: 0 % (ref 0.0–0.2)

## 2024-07-22 LAB — TROPONIN I (HIGH SENSITIVITY)
Troponin I (High Sensitivity): 32 ng/L — ABNORMAL HIGH (ref <18)
Troponin I (High Sensitivity): 33 ng/L — ABNORMAL HIGH (ref <18)

## 2024-07-22 NOTE — Discharge Instructions (Signed)
 Continue taking all of your medications as usual.  Check your weight every day to monitor your fluid balance.  Please follow-up with your cardiologist

## 2024-07-22 NOTE — ED Provider Notes (Signed)
 [facility] Provider Note    Event Date/Time   First MD Initiated Contact with Patient 07/22/24 2104     (approximate)   History   Chief Complaint: Shortness of Breath   HPI  [name] is a 68 y.o. female with a history of CKD, CHF, diabetes who comes ED complaining of shortness of breath, worse with exertion for the past week.  Called her cardiologist several days ago, is still waiting for them to schedule her for an appointment.  No chest pain.  Symptoms are not accelerating over the last several days.  No vomiting or diaphoresis or dizziness, no palpitations.        Past Medical History:  Diagnosis Date   Acute myocardial infarction, subendocardial infarction, initial episode of care (HCC) 07/21/2012   Acute osteomyelitis involving ankle and foot (HCC) 02/06/2015   Acute systolic heart failure (HCC) 07/21/2012   New onset 07/19/12; admission to [facility] ED. Elevated Troponins.  S/p 2D-echo with EF 20-25%.  S/p cardiac catheterization with stenting LAD.  Repeat 2D-echo 10/2011 with improved EF of 35%.    Anemia    Arthritis    Automatic implantable cardioverter-defibrillator in situ    a. MDT CRT-D 06/2014, SN: [id number]  .removed in feb 2020   CAD (coronary artery disease)    a. cardiac cath 101/04/2012: PCI/DES to chronically occluded mLAD, consideration PCI to diag branch in 4 weeks.    Cataract    Chicken pox    Chronic kidney disease    Chronic systolic CHF (congestive heart failure) (HCC)    a. mixed ICM & NICM; b. EF 20-25% by echo 07/2012, mid-dist 2/3 of LV sev HK/AK, mild MR. echo 10/2012: EF 30-35%, sev HK ant-septal & inf walls, GR1DD, mild MR, PASP 33. c. echo 02/2013: EF 30%, GR1DD, mild MR. echo 04/2014: EF 30%, Septal-lat dyssynchrony, global HK, inf AK, GR1DD, mild MR. d. echo 10/2014: EF50-55%, WM nl, GR1DD, septal mild paradox. e. echo 02/2015: EF 50-55%, wm    Depression    Heart attack (HCC)    Heel ulcer (HCC) 04/27/2015    History of blood transfusion ~ 2011   plasma; had neuropathy; couldn't walk   Hypertension    Hypothyroidism    LBBB (left bundle branch block)    Neuromuscular disorder (HCC)    Neuropathy 2011   Obesity, unspecified    OSA on CPAP    Moderate with AHI 23/hr and now on CPAP at 16cm H2O.  Her DME is [facility]   Pure hypercholesterolemia    Sepsis (HCC) 09/2018   Type II diabetes mellitus (HCC)    Unspecified vitamin D  deficiency     Current Outpatient Rx   Order #: [id number] Class: No Print   Order #: [id number] Class: Normal   Order #: [id number] Class: Normal   Order #: [id number] Class: Normal   Order #: [id number] Class: Normal   Order #: [id number] Class: Normal   Order #: [id number] Class: Normal   Order #: [id number] Class: Normal   Order #: [id number] Class: Normal   Order #: [id number] Class: No Print   Order #: [id number] Class: Normal   Order #: [id number] Class: Normal   Order #: [id number] Class: No Print   Order #: [id number] Class: Normal   Order #: [id number] Class: No Print   Order #: [id number] Class: Historical Med   Order #: [id number] Class: Normal   Order #: [id number] Class: Historical Med   Order #: [id number] Class: Normal   Order #: [id number] Class: Normal   Order #: [id number] Class: No Print   Order #:  [id number] Class: Normal   Order #: [id number] Class: Normal   Order #: [id number] Class: Normal    Past Surgical History:  Procedure Laterality Date   ABDOMINAL AORTOGRAM W/LOWER EXTREMITY N/A 02/02/2021   Procedure: ABDOMINAL AORTOGRAM W/LOWER EXTREMITY;  Surgeon: Eliza Lonni RAMAN, MD;  Location: [facility] INVASIVE CV LAB;  Service: Cardiovascular;  Laterality: N/A;   AMPUTATION Left 03/02/2021   Procedure: AMPUTATION RAY, left great toe;  Surgeon: Gershon Donnice SAUNDERS, DPM;  Location: [facility] OR;  Service: Podiatry;  Laterality: Left;   AMPUTATION TOE Right 06/18/2015   Procedure: AMPUTATION TOE;  Surgeon: Eva Gay, DPM;  Location: [facility] ORS;  Service: Podiatry;  Laterality: Right;    AMPUTATION TOE Left 10/08/2018   Procedure: AMPUTATION TOE LEFT 2ND;  Surgeon: Lilli Donnice, DPM;  Location: [facility] ORS;  Service: Podiatry;  Laterality: Left;   BACK SURGERY     BI-VENTRICULAR IMPLANTABLE CARDIOVERTER DEFIBRILLATOR N/A 07/06/2014   Procedure: BI-VENTRICULAR IMPLANTABLE CARDIOVERTER DEFIBRILLATOR  (CRT-D);  Surgeon: Elspeth JAYSON Sage, MD;  Location: [facility] CATH LAB;  Service: Cardiovascular;  Laterality: N/A;   BI-VENTRICULAR IMPLANTABLE CARDIOVERTER DEFIBRILLATOR  (CRT-D)  07/06/2014   BILATERAL OOPHORECTOMY  01/2011   ovarian cyst benign   BIV ICD INSERTION CRT-D N/A 03/31/2019   Procedure: BIV ICD INSERTION CRT-D;  Surgeon: Inocencio Soyla Lunger, MD;  Location: [facility] INVASIVE CV LAB;  Service: Cardiovascular;  Laterality: N/A;   COLONOSCOPY WITH PROPOFOL  Left 02/22/2015   Procedure: COLONOSCOPY WITH PROPOFOL ;  Surgeon: Deward CINDERELLA Piedmont, MD;  Location: [facility] ENDOSCOPY;  Service: Endoscopy;  Laterality: Left;   CORONARY ANGIOPLASTY WITH STENT PLACEMENT Left 07/2012   new onset systolic CHF; elevated troponins.  Cardiac catheterization with stenting to LAD; EF 15%.  2D-echo: EF 20-25%.   ESOPHAGOGASTRODUODENOSCOPY N/A 02/22/2015   Procedure: ESOPHAGOGASTRODUODENOSCOPY (EGD);  Surgeon: Deward CINDERELLA Piedmont, MD;  Location: [facility] ENDOSCOPY;  Service: Endoscopy;  Laterality: N/A;   I & D EXTREMITY Left 11/28/2020   Procedure: IRRIGATION AND DEBRIDEMENT OF FOOT;  Surgeon: Gershon Donnice SAUNDERS, DPM;  Location: [facility] OR;  Service: Podiatry;  Laterality: Left;   I & D EXTREMITY Left 01/31/2021   Procedure: IRRIGATION AND DEBRIDEMENT EXTREMITY OF LEFT FOOT WITH BONE BIOPSY OF  1ST METATARSAL AND FIFTH METATARSAL;  Surgeon: Tobie Franky SQUIBB, DPM;  Location: [facility] OR;  Service: Podiatry;  Laterality: Left;   I & D EXTREMITY Left 02/03/2021   Procedure: IRRIGATION AND DEBRIDEMENT EXTREMITY WITH INTERNAL AMPUTATION FIRST AND FIFTH RAY;  Surgeon: Tobie Franky SQUIBB, DPM;  Location: [facility] OR;  Service: Podiatry;  Laterality: Left;   INCISION AND  DRAINAGE ABSCESS Right 2007   groin; with ICU stay due to sepsis.   IR FLUORO GUIDE CV LINE RIGHT  03/06/2021   IR REMOVAL TUN CV CATH W/O FL  04/27/2021   IR US  GUIDE VASC ACCESS RIGHT  03/06/2021   IRRIGATION AND DEBRIDEMENT FOOT Left 04/25/2021   Procedure: EXCISION AND DEBRIDEMENT WOUND ULCER LEFT FOOT; METATARSECTOMY;  Surgeon: Gershon Donnice SAUNDERS, DPM;  Location: [facility] ORS;  Service: Podiatry;  Laterality: Left;   LAPAROSCOPIC CHOLECYSTECTOMY  2011   LEFT HEART CATH AND CORONARY ANGIOGRAPHY N/A 10/05/2018   Procedure: LEFT HEART CATH AND CORONARY ANGIOGRAPHY;  Surgeon: Perla Evalene PARAS, MD;  Location: [facility] INVASIVE CV LAB;  Service: Cardiovascular;  Laterality: N/A;   LEFT HEART CATHETERIZATION WITH CORONARY ANGIOGRAM N/A 07/21/2012   Procedure: LEFT HEART CATHETERIZATION WITH CORONARY ANGIOGRAM;  Surgeon: Toribio SAUNDERS Fuel, MD;  Location: [facility] CATH LAB;  Service: Cardiovascular;  Laterality: N/A;   LUMBAR  DISC SURGERY  1996   L4-5   PACEMAKER REMOVAL  11/2018   due to infection around pacemaker   PERCUTANEOUS CORONARY STENT INTERVENTION (PCI-S) N/A 07/23/2012   Procedure: PERCUTANEOUS CORONARY STENT INTERVENTION (PCI-S);  Surgeon: Ozell Fell, MD;  Location: [facility] CATH LAB;  Service: Cardiovascular;  Laterality: N/A;   PERIPHERAL VASCULAR BALLOON ANGIOPLASTY Left 10/06/2018   Procedure: PERIPHERAL VASCULAR BALLOON ANGIOPLASTY;  Surgeon: Jama Cordella MATSU, MD;  Location: [facility] INVASIVE CV LAB;  Service: Cardiovascular;  Laterality: Left;   PERIPHERAL VASCULAR CATHETERIZATION N/A 02/10/2015   Procedure: Picc Line Insertion;  Surgeon: Cordella MATSU Jama, MD;  Location: [facility] INVASIVE CV LAB;  Service: Cardiovascular;  Laterality: N/A;   RIGHT HEART CATH N/A 03/29/2019   Procedure: RIGHT HEART CATH;  Surgeon: Cherrie Toribio SAUNDERS, MD;  Location: [facility] INVASIVE CV LAB;  Service: Cardiovascular;  Laterality: N/A;   TEE WITHOUT CARDIOVERSION N/A 10/02/2018   Procedure: TRANSESOPHAGEAL ECHOCARDIOGRAM (TEE);   Surgeon: Darron Deatrice LABOR, MD;  Location: [facility] ORS;  Service: Cardiovascular;  Laterality: N/A;   TOTAL KNEE ARTHROPLASTY Right 05/07/2022   Procedure: TOTAL KNEE ARTHROPLASTY;  Surgeon: Ernie Cough, MD;  Location: [facility] ORS;  Service: Orthopedics;  Laterality: Right;   TUBAL LIGATION  1981   VAGINAL HYSTERECTOMY  01/2011   Fibroids/DUB.  Ovaries removed. [place].    Physical Exam   Triage Vital Signs: ED Triage Vitals  Encounter Vitals Group     BP 07/22/24 1258 (!) 176/90     Girls Systolic BP Percentile --      Girls Diastolic BP Percentile --      Boys Systolic BP Percentile --      Boys Diastolic BP Percentile --      Pulse Rate 07/22/24 1258 79     Resp 07/22/24 1258 20     Temp 07/22/24 1258 97.6 F (36.4 C)     Temp Source 07/22/24 1258 Oral     SpO2 07/22/24 1258 100 %     Weight 07/22/24 1259 259 lb (117.5 kg)     Height 07/22/24 1259 5' 8 (1.727 m)     Head Circumference --      Peak Flow --      Pain Score 07/22/24 1259 0     Pain Loc --      Pain Education --      Exclude from Growth Chart --     Most recent vital signs: Vitals:   07/22/24 2230 07/22/24 2300  BP: (!) 173/88 (!) 177/91  Pulse: 74 72  Resp: 13 15  Temp:    SpO2: 99% 100%    General: Awake, no distress.  CV:  Good peripheral perfusion.  Regular rate rhythm Resp:  Normal effort.  Lungs clear to auscultation Abd:  No distention.  Soft nontender Other:  Symmetric calf circumference, no calf tenderness.  No acute edema per patient   ED Results / Procedures / Treatments   Labs (all labs ordered are listed, but only abnormal results are displayed) Labs Reviewed  BASIC METABOLIC PANEL WITH GFR - Abnormal; Notable for the following components:      Result Value   Potassium 5.7 (*)    BUN 59 (*)    Creatinine, Ser 2.12 (*)    GFR, Estimated 25 (*)    Anion gap 16 (*)    All other components within normal limits  CBC - Abnormal; Notable for the following components:   RBC 5.17 (*)     Hemoglobin 15.3 (*)  HCT 46.8 (*)    All other components within normal limits  TROPONIN I (HIGH SENSITIVITY) - Abnormal; Notable for the following components:   Troponin I (High Sensitivity) 32 (*)    All other components within normal limits  TROPONIN I (HIGH SENSITIVITY) - Abnormal; Notable for the following components:   Troponin I (High Sensitivity) 33 (*)    All other components within normal limits     EKG Interpreted by me Ventricular paced rhythm, rate of 82.  Right bundle branch block   RADIOLOGY Chest x-ray interpreted by me, unremarkable.  Radiology report reviewed.   CT chest negative for acute findings   PROCEDURES:  Procedures   MEDICATIONS ORDERED IN ED: Medications - No data to display   IMPRESSION / MDM / ASSESSMENT AND PLAN / ED COURSE  I reviewed the triage vital signs and the nursing notes.  DDx: Pneumonia, pulmonary edema, pleural effusion, NSTEMI, anemia, electrolyte derangement, AKI  Patient's presentation is most consistent with acute presentation with potential threat to life or bodily function.  Patient presents with shortness of breath, no chest pain, not consistent with unstable angina.  Labs are essentially baseline, flat delta troponin.  Doubt PE or dissection.  Stable to follow-up with her cardiology office.       FINAL CLINICAL IMPRESSION(S) / ED DIAGNOSES   Final diagnoses:  Dyspnea, unspecified type  Type 2 diabetes mellitus without complication, with long-term current use of insulin  (HCC)  Chronic atrial fibrillation (HCC)  Stage 4 chronic kidney disease (HCC)     Rx / DC Orders   ED Discharge Orders          Ordered    Ambulatory referral to Cardiology       Comments: If you have not heard from the Cardiology office within the next 72 hours please call [phone].   07/22/24 2330             Note:  This document was prepared using Dragon voice recognition software and may include unintentional dictation  errors.   Viviann Pastor, MD 07/22/24 [phone]

## 2024-07-22 NOTE — ED Triage Notes (Signed)
 Patient states shortness of breath upon exertion and palpitations since this weekend, denies pain.

## 2024-07-23 ENCOUNTER — Telehealth: Payer: Self-pay

## 2024-07-23 NOTE — Telephone Encounter (Signed)
 Spoke with patient to advise that she has 2 boxes of the novolog  flex pen available for pick up

## 2024-07-23 NOTE — Progress Notes (Unsigned)
 Cardiology Office Note   Date:  07/26/2024  ID:  [name], DOB 1956/04/09, MRN [id number] PCP: Vickie Arlyss GORMAN, MD  [facility] Providers Cardiologist:  Vickie Lunger, MD Electrophysiologist:  Vickie ONEIDA HOLTS, MD  Electrophysiology APP:  [name], Suzann, NP  Advanced Heart Failure:  Vickie Fuel, MD   History of Present Illness [name] is a 68 y.o. female PMH CAD, mixed ICM/NICM, LBBB status post CRT-D, CKD 3B who presents for further evaluation management of dyspnea.  Patient recently seen in the ED on 07/22/2024 for dyspnea.  Troponins were mildly elevated and flat in the 30s.  CBC, BMP were consistent with prior.  Felt she was stable for discharge, so referred her for standard cardiology follow-up.  She is overall feeling near her baseline, though with potentially a little bit more dyspnea.  She denies any chest discomfort.  She denies any lower extremity edema that does not go away.  Denies any orthopnea.  Denies any PND.  Relevant CVD History -TTE 08/2023 LVEF 55% with apical hypokinesis/aneurysm, grade 1 diastolic dysfunction - TTE 11/2021 LVEF 50 to 55% with grade 1 diastolic dysfunction, mild MR - TTE 03/2020 LVEF 50 to 55%, grade 1 diastolic dysfunction, mild to moderate MR - TTE 09/2019 LVEF 35% - BiV upgrade 03/2019 - TTE 03/2019 LVEF 10 to 15% with severely reduced RV function - Cath 09/2018 80% proximal LCx, 90% proximal LAD, 90% D1, 40% mid RCA   ROS: Pt denies any chest discomfort, jaw pain, arm pain, palpitations, syncope, presyncope, orthopnea, PND, or LE edema.  Studies Reviewed I have independently reviewed the patient's ECG, previous cardiac testing, recent medical records.  Physical Exam VS:  BP 106/60 (BP Location: Left Arm, Patient Position: Sitting, Cuff Size: Large)   Pulse 77   Ht 5' 8 (1.727 m)   Wt 250 lb (113.4 kg)   SpO2 99%   BMI 38.01 kg/m        Wt Readings from Last 3 Encounters:  07/26/24 250 lb (113.4 kg)   07/22/24 259 lb (117.5 kg)  07/13/24 259 lb 6.4 oz (117.7 kg)    GEN: No acute distress. NECK: No JVD; No carotid bruits. CARDIAC: RRR, no murmurs, rubs, gallops. RESPIRATORY:  Clear to auscultation. EXTREMITIES:  Warm and well-perfused. No edema.  ASSESSMENT AND PLAN Dyspnea CAD ICM/NICM status post CRT-D with recovered EF HLD Unclear etiology of worsening dyspnea.  She overall appears euvolemic today and is at her known baseline dry weight of approximately 250 pounds.  She does have significant un-revascularized CAD.  Her ejection fraction has improved significantly with resynchronization therapy.  Advanced kidney disease is somewhat limiting. Last LDL 69 09/2023.  This could be due to progression of underlying heart failure versus worsening CAD burden or other structural heart disease.  Data: - Continue Plavix  75 mg daily - Increase Lipitor to 20 mg daily; will target a goal LDL less than 55 given her ASCVD history - Continue Coreg  3.125 mg twice daily - Continue torsemide  20 mg twice daily - No ACE/ARB/ARNI, MRA per nephrology and HF given advanced kidney disease and prior azotemia with these agents - SGLT2 inhibitor contraindicated given current GFR - Will plan for stress PET to further risk stratify her given previous CAD and advanced kidney disease - Repeat echo to evaluate for any structural causes - Continue to follow with advanced heart failure and EP as directed - If the above workup is unrevealing, then we should consider RHC for objective evaluation of  CI and filling pressures        Dispo: RTC prn after results of cardiac testing  Signed, Caron Poser, MD

## 2024-07-26 ENCOUNTER — Ambulatory Visit

## 2024-07-26 VITALS — BP 106/60 | HR 77 | Ht 68.0 in | Wt 250.0 lb

## 2024-07-26 DIAGNOSIS — R0609 Other forms of dyspnea: Secondary | ICD-10-CM | POA: Diagnosis not present

## 2024-07-26 DIAGNOSIS — I25118 Atherosclerotic heart disease of native coronary artery with other forms of angina pectoris: Secondary | ICD-10-CM | POA: Diagnosis not present

## 2024-07-26 DIAGNOSIS — I428 Other cardiomyopathies: Secondary | ICD-10-CM

## 2024-07-26 DIAGNOSIS — I255 Ischemic cardiomyopathy: Secondary | ICD-10-CM | POA: Diagnosis not present

## 2024-07-26 DIAGNOSIS — E782 Mixed hyperlipidemia: Secondary | ICD-10-CM | POA: Diagnosis not present

## 2024-07-26 MED ORDER — ATORVASTATIN CALCIUM 20 MG PO TABS
20.0000 mg | ORAL_TABLET | Freq: Every day | ORAL | 3 refills | Status: AC
Start: 2024-07-26 — End: ?

## 2024-07-26 NOTE — Patient Instructions (Signed)
 Medication Instructions:  Your physician recommends the following medication changes.  INCREASE: Lipitor from 10 mg to 20 mg  by mouth once daily *If you need a refill on your cardiac medications before your next appointment, please call your pharmacy*  Lab Work: No labs ordered today  If you have labs (blood work) drawn today and your tests are completely normal, you will receive your results only by: MyChart Message (if you have MyChart) OR A paper copy in the mail If you have any lab test that is abnormal or we need to change your treatment, we will call you to review the results.  Testing/Procedures: Your physician has requested that you have an echocardiogram. Echocardiography is a painless test that uses sound waves to create images of your heart. It provides your doctor with information about the size and shape of your heart and how well your heart's chambers and valves are working.   You may receive an ultrasound enhancing agent through an IV if needed to better visualize your heart during the echo. This procedure takes approximately one hour.  There are no restrictions for this procedure.  This will take place at [address] [facility] [address], [place] [address]  Please note: We ask at that you not bring children with you during ultrasound (echo/ vascular) testing. Due to room size and safety concerns, children are not allowed in the ultrasound rooms during exams. Our front office staff cannot provide observation of children in our lobby area while testing is being conducted. An adult accompanying a patient to their appointment will only be allowed in the ultrasound room at the discretion of the ultrasound technician under special circumstances. We apologize for any inconvenience.    Please report to Radiology at [facility] Main Entrance, medical mall, 30 mins prior to your test.  [address]  [place], [place]  How to Prepare for  Your Cardiac PET/CT Stress Test:  Nothing to eat or drink, except water, 3 hours prior to arrival time.  NO caffeine/decaffeinated products, or chocolate 12 hours prior to arrival. (Please note decaffeinated beverages (teas/coffees) still contain caffeine).  If you have caffeine within 12 hours prior, the test will need to be rescheduled.  Medication instructions:  Do not take nitrates (isosorbide  mononitrate, Ranexa) the day before or day of test Hold Torsemide  until after the procedure.  Diabetic Preparation: If able to eat breakfast prior to 3 hour fasting, you may take all medications, including your insulin . Do not worry if you miss your breakfast dose of insulin  - start at your next meal. If you do not eat prior to 3 hour fast-Hold all diabetes (oral and insulin ) medications. Patients who wear a continuous glucose monitor MUST remove the device prior to scanning.  You may take your remaining medications with water.  NO perfume, cologne or lotion on chest or abdomen area. FEMALES - Please avoid wearing dresses to this appointment.  Total time is 1 to 2 hours; you may want to bring reading material for the waiting time.  IF YOU THINK YOU MAY BE PREGNANT, OR ARE NURSING PLEASE INFORM THE TECHNOLOGIST.  In preparation for your appointment, medication and supplies will be purchased.  Appointment availability is limited, so if you need to cancel or reschedule, please call the Radiology Department Scheduler at [phone] 24 hours in advance to avoid a cancellation fee of $100.00  What to Expect When you Arrive:  Once you arrive and check in for your appointment, you  will be taken to a preparation room within the Radiology Department.  A technologist or Nurse will obtain your medical history, verify that you are correctly prepped for the exam, and explain the procedure.  Afterwards, an IV will be started in your arm and electrodes will be placed on your skin for EKG monitoring during the  stress portion of the exam. Then you will be escorted to the PET/CT scanner.  There, staff will get you positioned on the scanner and obtain a blood pressure and EKG.  During the exam, you will continue to be connected to the EKG and blood pressure machines.  A small, safe amount of a radioactive tracer will be injected in your IV to obtain a series of pictures of your heart along with an injection of a stress agent.    After your Exam:  It is recommended that you eat a meal and drink a caffeinated beverage to counter act any effects of the stress agent.  Drink plenty of fluids for the remainder of the day and urinate frequently for the first couple of hours after the exam.  Your doctor will inform you of your test results within 7-10 business days.  For more information and frequently asked questions, please visit our website: [email or web address]  For questions about your test or how to prepare for your test, please call: Cardiac Imaging Nurse Navigators Office: [phone]   Follow-Up: At [facility], you and your health needs are our priority.  As part of our continuing mission to provide you with exceptional heart care, our providers are all part of one team.  This team includes your primary Cardiologist (physician) and Advanced Practice Providers or APPs (Physician Assistants and Nurse Practitioners) who all work together to provide you with the care you need, when you need it.  Your next appointment:   As Needed   Provider:   You may see Caron Poser, MD or one of the following Advanced Practice Providers on your designated Care Team:   Lonni Meager, NP Lesley Maffucci, PA-C Bernardino Bring, PA-C Cadence Harrah, PA-C Tylene Lunch, NP Barnie Hila, NP    We recommend signing up for the patient portal called MyChart.  Sign up information is provided on this After Visit Summary.  MyChart is used to connect with patients for Virtual Visits (Telemedicine).   Patients are able to view lab/test results, encounter notes, upcoming appointments, etc.  Non-urgent messages can be sent to your provider as well.   To learn more about what you can do with MyChart, go to [email or web address].

## 2024-07-26 NOTE — Telephone Encounter (Signed)
Pt came by and picked it up.  

## 2024-07-29 ENCOUNTER — Telehealth: Payer: Self-pay

## 2024-07-29 NOTE — Telephone Encounter (Signed)
 Received patient assistance medications for patient.  Needles  1boxes  Medications have been placed in the refrigerator and labeled with patient information  and Patient has been informed can pick up medications in our office during normal business hours.  Needles and Ozempic  placed in the refrigerator in the same bag.

## 2024-07-29 NOTE — Telephone Encounter (Signed)
 Received patient assistance medications for patient.  Ozempic  3boxes  Medications have been placed in the refrigerator and labeled with patient information  and Patient has been informed can pick up medications in our office during normal business hours.

## 2024-08-02 ENCOUNTER — Other Ambulatory Visit: Payer: Self-pay

## 2024-08-02 ENCOUNTER — Ambulatory Visit: Payer: PPO

## 2024-08-02 DIAGNOSIS — I5022 Chronic systolic (congestive) heart failure: Secondary | ICD-10-CM | POA: Diagnosis not present

## 2024-08-03 ENCOUNTER — Other Ambulatory Visit (HOSPITAL_COMMUNITY): Payer: Self-pay | Admitting: *Deleted

## 2024-08-03 ENCOUNTER — Encounter (HOSPITAL_COMMUNITY): Payer: Self-pay

## 2024-08-03 DIAGNOSIS — I25118 Atherosclerotic heart disease of native coronary artery with other forms of angina pectoris: Secondary | ICD-10-CM

## 2024-08-03 DIAGNOSIS — R0609 Other forms of dyspnea: Secondary | ICD-10-CM

## 2024-08-03 MED ORDER — CARVEDILOL 3.125 MG PO TABS: 3.1250 mg | ORAL_TABLET | Freq: Two times a day (BID) | ORAL | 2 refills | Status: DC

## 2024-08-04 ENCOUNTER — Ambulatory Visit: Payer: Self-pay | Admitting: Cardiology

## 2024-08-04 ENCOUNTER — Encounter: Payer: Self-pay | Admitting: Internal Medicine

## 2024-08-04 LAB — CUP PACEART REMOTE DEVICE CHECK
Battery Remaining Longevity: 14 mo
Battery Voltage: 2.9 V
Brady Statistic AP VP Percent: 66.59 %
Brady Statistic AP VS Percent: 0.05 %
Brady Statistic AS VP Percent: 33.32 %
Brady Statistic AS VS Percent: 0.04 %
Brady Statistic RA Percent Paced: 66.19 %
Brady Statistic RV Percent Paced: 99.32 %
Date Time Interrogation Session: 20251027033424
HighPow Impedance: 71 Ohm
Implantable Lead Connection Status: 753985
Implantable Lead Connection Status: 753985
Implantable Lead Connection Status: 753985
Implantable Lead Implant Date: 20200625
Implantable Lead Implant Date: 20200625
Implantable Lead Implant Date: 20200625
Implantable Lead Location: 753858
Implantable Lead Location: 753859
Implantable Lead Location: 753860
Implantable Lead Model: 4398
Implantable Lead Model: 5076
Implantable Lead Model: 6935
Implantable Pulse Generator Implant Date: 20200625
Lead Channel Impedance Value: 1026 Ohm
Lead Channel Impedance Value: 1235 Ohm
Lead Channel Impedance Value: 1254 Ohm
Lead Channel Impedance Value: 216.367
Lead Channel Impedance Value: 218.88 Ohm
Lead Channel Impedance Value: 235.862
Lead Channel Impedance Value: 331.831
Lead Channel Impedance Value: 337.778
Lead Channel Impedance Value: 342 Ohm
Lead Channel Impedance Value: 399 Ohm
Lead Channel Impedance Value: 418 Ohm
Lead Channel Impedance Value: 475 Ohm
Lead Channel Impedance Value: 589 Ohm
Lead Channel Impedance Value: 608 Ohm
Lead Channel Impedance Value: 760 Ohm
Lead Channel Impedance Value: 779 Ohm
Lead Channel Impedance Value: 874 Ohm
Lead Channel Impedance Value: 874 Ohm
Lead Channel Pacing Threshold Amplitude: 0.625 V
Lead Channel Pacing Threshold Amplitude: 0.625 V
Lead Channel Pacing Threshold Amplitude: 2.75 V
Lead Channel Pacing Threshold Pulse Width: 0.4 ms
Lead Channel Pacing Threshold Pulse Width: 0.4 ms
Lead Channel Pacing Threshold Pulse Width: 1 ms
Lead Channel Sensing Intrinsic Amplitude: 1.375 mV
Lead Channel Sensing Intrinsic Amplitude: 1.375 mV
Lead Channel Sensing Intrinsic Amplitude: 24.375 mV
Lead Channel Sensing Intrinsic Amplitude: 24.375 mV
Lead Channel Setting Pacing Amplitude: 1.75 V
Lead Channel Setting Pacing Amplitude: 2 V
Lead Channel Setting Pacing Amplitude: 3 V
Lead Channel Setting Pacing Pulse Width: 0.4 ms
Lead Channel Setting Pacing Pulse Width: 1 ms
Lead Channel Setting Sensing Sensitivity: 0.3 mV
Zone Setting Status: 755011
Zone Setting Status: 755011

## 2024-08-05 ENCOUNTER — Ambulatory Visit
Admission: RE | Admit: 2024-08-05 | Discharge: 2024-08-05 | Disposition: A | Discharge status: Home / Self Care | Source: Ambulatory Visit

## 2024-08-05 DIAGNOSIS — R0609 Other forms of dyspnea: Secondary | ICD-10-CM | POA: Diagnosis not present

## 2024-08-05 DIAGNOSIS — I255 Ischemic cardiomyopathy: Secondary | ICD-10-CM | POA: Diagnosis not present

## 2024-08-05 DIAGNOSIS — I25118 Atherosclerotic heart disease of native coronary artery with other forms of angina pectoris: Secondary | ICD-10-CM | POA: Diagnosis not present

## 2024-08-05 DIAGNOSIS — M479 Spondylosis, unspecified: Secondary | ICD-10-CM | POA: Diagnosis not present

## 2024-08-05 LAB — NM PET CT CARDIAC PERFUSION MULTI W/ABSOLUTE BLOODFLOW
LV dias vol: 128 mL (ref 46–106)
LV sys vol: 70 mL (ref 3.8–5.2)
MBFR: 1.66
Nuc Rest EF: 26 %
Nuc Stress EF: 45 %
Peak HR: 69 {beats}/min
Rest HR: 72 {beats}/min
Rest MBF: 0.9 ml/g/min
Rest Nuclear Isotope Dose: 24.9 mCi
SRS: 3
SSS: 10
Stress MBF: 1.49 ml/g/min
Stress Nuclear Isotope Dose: 25 mCi
TID: 1.04

## 2024-08-05 MED ORDER — REGADENOSON 0.4 MG/5ML IV SOLN
INTRAVENOUS | Status: AC
  Filled 2024-08-05: qty 5

## 2024-08-05 MED ORDER — RUBIDIUM RB82 GENERATOR (RUBYFILL)
24.9500 | PACK | Freq: Once | INTRAVENOUS | Status: AC
  Administered 2024-08-05: 24.95 via INTRAVENOUS

## 2024-08-05 MED ORDER — RUBIDIUM RB82 GENERATOR (RUBYFILL)
24.9300 | PACK | Freq: Once | INTRAVENOUS | Status: AC
  Administered 2024-08-05: 24.93 via INTRAVENOUS

## 2024-08-05 MED ORDER — REGADENOSON 0.4 MG/5ML IV SOLN
0.4000 mg | Freq: Once | INTRAVENOUS | Status: AC
  Administered 2024-08-05: 0.4 mg via INTRAVENOUS
  Filled 2024-08-05: qty 5

## 2024-08-05 NOTE — Progress Notes (Signed)
 Remote ICD Transmission

## 2024-08-09 ENCOUNTER — Ambulatory Visit: Payer: Self-pay

## 2024-08-09 DIAGNOSIS — R0609 Other forms of dyspnea: Secondary | ICD-10-CM

## 2024-08-24 ENCOUNTER — Other Ambulatory Visit: Payer: Self-pay

## 2024-09-03 ENCOUNTER — Telehealth: Payer: Self-pay

## 2024-09-03 NOTE — Telephone Encounter (Signed)
 Filled and faxed Novo Nordisk (Novolog ) pap to provider office to sign and date ,pap submitted online, Mail out [place](Farxiga) to pt address. Faxing provider portion.

## 2024-09-07 ENCOUNTER — Ambulatory Visit

## 2024-09-07 DIAGNOSIS — I255 Ischemic cardiomyopathy: Secondary | ICD-10-CM

## 2024-09-07 DIAGNOSIS — I25118 Atherosclerotic heart disease of native coronary artery with other forms of angina pectoris: Secondary | ICD-10-CM

## 2024-09-07 DIAGNOSIS — R0609 Other forms of dyspnea: Secondary | ICD-10-CM | POA: Diagnosis not present

## 2024-09-07 LAB — ECHOCARDIOGRAM COMPLETE
AV Mean grad: 5 mmHg
AV Peak grad: 8.6 mmHg
Ao pk vel: 1.47 m/s
Area-P 1/2: 3.37 cm2

## 2024-09-07 MED ORDER — PERFLUTREN LIPID MICROSPHERE
1.0000 mL | INTRAVENOUS | Status: AC | PRN
  Administered 2024-09-07: 3 mL via INTRAVENOUS

## 2024-09-07 NOTE — Telephone Encounter (Unsigned)
 Copied from CRM #[id number]. Topic: General - Other >> Sep 07, 2024  9:18 AM Burnard DEL wrote: Reason for CRM: [employer] called in stating that they need the providers portion of patient enrollment form completed.Pages 7,8,and 9. They will fax over the 10 page document to the office. Once received they would like provider to complete their portion and fax back please. They do not need the patients portion completed.

## 2024-09-08 ENCOUNTER — Telehealth: Payer: Self-pay | Admitting: Cardiovascular Disease

## 2024-09-08 ENCOUNTER — Other Ambulatory Visit: Payer: Self-pay | Admitting: Family Medicine

## 2024-09-08 ENCOUNTER — Other Ambulatory Visit: Payer: Self-pay | Admitting: *Deleted

## 2024-09-08 DIAGNOSIS — R0609 Other forms of dyspnea: Secondary | ICD-10-CM

## 2024-09-08 MED ORDER — APIXABAN 5 MG PO TABS: 5.0000 mg | ORAL_TABLET | Freq: Two times a day (BID) | ORAL | 3 refills | Status: DC

## 2024-09-08 NOTE — Telephone Encounter (Signed)
 Pt c/o medication issue:  1. Name of Medication:   levothyroxine      2. How are you currently taking this medication (dosage and times per day)? As written   3. Are you having a reaction (difficulty breathing--STAT)? No   4. What is your medication issue? Pharmacy called to clarify dose and directions please advise

## 2024-09-08 NOTE — Progress Notes (Signed)
 Spoke with patient at length to follow up with her discussion with Dr. Argentina.  An order was placed for Cardiac MRI @ [facility]; pt has a Geographical Information Systems Officer; pt also has history of cardiac stents x3 and bilateral knee replacements; pt informed to stop Plavix  and start taking Eliquis  5mg  by mouth twice daily; prescription has been sent to patient's preferred pharmacy; pt has been advised to come to our office or any [facility] facility to have Hemoglobin and Hematocrit drawn; pt has a follow up appointment scheduled with Dr. Caron Argentina on 09/16/2024 @ 2pm.  All questions were answered and pt verbalized understanding.

## 2024-09-08 NOTE — Telephone Encounter (Signed)
 Spoke with the pharmacy representative and recommended that they contact Dr. Elfredia office regarding the patient's levothyroxine . The pharmacy representative verbalized understanding.

## 2024-09-08 NOTE — Telephone Encounter (Signed)
Printed and placed in your box for review

## 2024-09-08 NOTE — Telephone Encounter (Signed)
Thanks.  I will work on the hardcopy. 

## 2024-09-09 ENCOUNTER — Telehealth: Payer: Self-pay | Admitting: *Deleted

## 2024-09-09 LAB — OPHTHALMOLOGY REPORT-SCANNED

## 2024-09-09 MED ORDER — LEVOTHYROXINE SODIUM 25 MCG PO TABS: 37.5000 ug | ORAL_TABLET | Freq: Every day | ORAL | 1 refills | Status: AC

## 2024-09-09 NOTE — Telephone Encounter (Signed)
 I resent the rx. Let me know if she can't get it filled.  Thanks.

## 2024-09-09 NOTE — Telephone Encounter (Signed)
 Copied from CRM [id number]. Topic: Clinical - Medication Question >> Sep 08, 2024  3:42 PM Drema MATSU wrote: Reason for CRM: [place] from the pharmacy is calling regarding the directions of medication. He states that she has been taking 1 1/2 daily of levothyroxine  (SYNTHROID ) 25 MCG tablet. He stated that the directions he recieved is saying 1 daily. He is needing it changed to 1 1/2.

## 2024-09-09 NOTE — Addendum Note (Signed)
 Addended by: CLEATUS ARLYSS RAMAN on: 09/09/2024 12:41 PM   Modules accepted: Orders

## 2024-09-09 NOTE — Telephone Encounter (Signed)
 Please advise on correct directions patient is supposed to be taking rx and update if needed.

## 2024-09-10 NOTE — Telephone Encounter (Signed)
 Spoke with pt relaying Dr Elfredia message. Pt states she has already picked up rx and expresses her thanks.

## 2024-09-15 NOTE — Telephone Encounter (Signed)
 Fixed and faxed provider portion to [employer] [facility]. Received [employer] provider portion

## 2024-09-16 ENCOUNTER — Ambulatory Visit

## 2024-09-16 VITALS — BP 120/62 | HR 72 | Ht 68.0 in | Wt 248.0 lb

## 2024-09-16 DIAGNOSIS — I25118 Atherosclerotic heart disease of native coronary artery with other forms of angina pectoris: Secondary | ICD-10-CM | POA: Diagnosis not present

## 2024-09-16 DIAGNOSIS — I428 Other cardiomyopathies: Secondary | ICD-10-CM | POA: Diagnosis not present

## 2024-09-16 DIAGNOSIS — R0609 Other forms of dyspnea: Secondary | ICD-10-CM

## 2024-09-16 DIAGNOSIS — E782 Mixed hyperlipidemia: Secondary | ICD-10-CM | POA: Diagnosis not present

## 2024-09-16 DIAGNOSIS — I5020 Unspecified systolic (congestive) heart failure: Secondary | ICD-10-CM | POA: Diagnosis not present

## 2024-09-16 DIAGNOSIS — I240 Acute coronary thrombosis not resulting in myocardial infarction: Secondary | ICD-10-CM

## 2024-09-16 MED ORDER — ISOSORBIDE MONONITRATE ER 30 MG PO TB24: 30.0000 mg | ORAL_TABLET | Freq: Every day | ORAL | 3 refills | Status: AC

## 2024-09-16 MED ORDER — CARVEDILOL 6.25 MG PO TABS: 6.2500 mg | ORAL_TABLET | Freq: Two times a day (BID) | ORAL | 3 refills | Status: AC

## 2024-09-16 NOTE — Progress Notes (Signed)
 Cardiology Office Note   Date:  09/16/2024  ID:  [name], DOB 05/12/1956, MRN [id number] PCP: Cleatus Arlyss GORMAN, MD  [facility] Providers Cardiologist:  Evalene Lunger, MD Electrophysiologist:  [name], MD  Electrophysiology APP:  Riddle, Suzann, NP  Advanced Heart Failure:  Toribio Fuel, MD   History of Present Illness [name] is a 68 y.o. female PMH CAD, mixed ICM/NICM, LBBB status post CRT-D, CKD 3B who presents for further evaluation management of dyspnea.  Patient recently seen in the ED on 07/22/2024 for dyspnea.  Troponins were mildly elevated and flat in the 30s.  CBC, BMP were consistent with prior.  Felt she was stable for discharge, so referred her for standard cardiology follow-up.  She is overall feeling near her baseline, though with potentially a little bit more dyspnea.  She denies any chest discomfort.  She denies any lower extremity edema that does not go away.  Denies any orthopnea.  Denies any PND.  Interval History: Since last visit, we obtained a PET and an echo.  PET showed infarction with peri-infarct ischemia in the apex as well as a mild reversible defect inferior.  There were some other mild abnormalities present.  Echo showed preserved EF with known apical infarct, unable to rule out thrombus.  Not a candidate for redo PCI based on prior evaluations.  She continues to have DOE which has not improved, but has also not worsened.  Relevant CVD History -TTE 09/2024 LVEF 50 to 55% with apical hypokinesis, unable to rule out thrombus.  Grade 1 diastolic dysfunction, normal RV size and function, no significant valvular disease -Stress PET 07/2024 medium size defect apex that is partially reversible, likely infarct with peri-infarct ischemia.  Small reversible defect apical to mid inferior. -TTE 08/2023 LVEF 55% with apical hypokinesis/aneurysm, grade 1 diastolic dysfunction - TTE 11/2021 LVEF 50 to 55% with grade 1 diastolic  dysfunction, mild MR - TTE 03/2020 LVEF 50 to 55%, grade 1 diastolic dysfunction, mild to moderate MR - TTE 09/2019 LVEF 35% - BiV upgrade 03/2019 - TTE 03/2019 LVEF 10 to 15% with severely reduced RV function - Cath 09/2018 80% proximal LCx, 90% proximal LAD, 90% D1, 40% mid RCA   ROS: Pt denies any chest discomfort, jaw pain, arm pain, palpitations, syncope, presyncope, orthopnea, PND, or LE edema.  Studies Reviewed I have independently reviewed the patient's ECG, previous cardiac testing, recent medical records.  Physical Exam VS:  BP 120/62 (BP Location: Left Arm, Patient Position: Sitting, Cuff Size: Large)   Pulse 72   Ht 5' 8 (1.727 m)   Wt 248 lb (112.5 kg)   SpO2 97%   BMI 37.71 kg/m        Wt Readings from Last 3 Encounters:  09/16/24 248 lb (112.5 kg)  07/26/24 250 lb (113.4 kg)  07/22/24 259 lb (117.5 kg)    GEN: No acute distress. NECK: No JVD; No carotid bruits. CARDIAC: RRR, no murmurs, rubs, gallops. RESPIRATORY:  Clear to auscultation. EXTREMITIES:  Warm and well-perfused. No edema.  ASSESSMENT AND PLAN Dyspnea CAD ICM/NICM status post CRT-D with recovered EF HLD Unclear etiology of worsening dyspnea.  She overall appears euvolemic today and is at her known baseline dry weight of approximately 250 pounds.  OptiVol has been low on recent device interrogation. She does have significant un-revascularized CAD.  Her ejection fraction has improved significantly with resynchronization therapy.  Advanced kidney disease is somewhat limiting. Last LDL 69 09/2023.  Based on prior  evaluations with interventional cardiology and advanced heart failure, she is not a candidate for re-do PCI given lack of targets.  Deconditioning is also in the differential given low activity level documented on device interrogations.  Plan: - Continue Eliquis  5 mg twice daily for possible LV thrombus; Plavix  on hold - Continue Lipitor to 20 mg daily; recheck lipids today - Increase coreg  to  6.25 mg twice daily and add imdur  30mg  every day given mild ischemia seen on recent PET. We will see if this helps with her symptoms since DOE could be anginal equivalent - Continue torsemide  20 mg twice daily - No ACE/ARB/ARNI, MRA per nephrology and HF given advanced kidney disease and prior azotemia with these agents - SGLT2 inhibitor contraindicated given current GFR - Continue to follow with advanced heart failure and EP as directed - Will do RHC to further evaluate if we cannot get her feeling better with anti-anginals. Will plan to wait until after MRI so we can hold [facility] appropriately  Possible LV thrombus Unable to rule out LV thrombus based on recent contrasted echo.  She has known apical infarct, so substrate is there.   Plan: - Cardiac MRI to evaluate presence of thrombus - Continue Eliquis  5 mg twice daily until then; if MRI does not show thrombus, then we will stop the Eliquis  and restart her Plavix         Dispo: RTC 3 months or sooner as needed  Signed, Caron Poser, MD

## 2024-09-16 NOTE — Patient Instructions (Signed)
 Medication Instructions:  Your physician recommends the following medication changes.  START TAKING: IMDUR  30 mg once daily  INCREASE: Coreg   3.125 mg to 6.25 mg twice daily  Continue all other current medications as prescribed  *If you need a refill on your cardiac medications before your next appointment, please call your pharmacy*  Lab Work: No labs ordered today  If you have labs (blood work) drawn today and your tests are completely normal, you will receive your results only by: MyChart Message (if you have MyChart) OR A paper copy in the mail If you have any lab test that is abnormal or we need to change your treatment, we will call you to review the results.  Testing/Procedures:  Cardiac MRI scheduled for 09/28/2024 @ 1 PM  Follow-Up: At [facility], you and your health needs are our priority.  As part of our continuing mission to provide you with exceptional heart care, our providers are all part of one team.  This team includes your primary Cardiologist (physician) and Advanced Practice Providers or APPs (Physician Assistants and Nurse Practitioners) who all work together to provide you with the care you need, when you need it.  Your next appointment:  3 month(s)  Provider:  Caron Poser, MD    We recommend signing up for the patient portal called MyChart.  Sign up information is provided on this After Visit Summary.  MyChart is used to connect with patients for Virtual Visits (Telemedicine).  Patients are able to view lab/test results, encounter notes, upcoming appointments, etc.  Non-urgent messages can be sent to your provider as well.   To learn more about what you can do with MyChart, go to [email or web address].

## 2024-09-20 NOTE — Progress Notes (Unsigned)
°  Electrophysiology Office Follow up Visit Note:    Date:  09/22/2024   ID:  Dickey GORMAN Stamps, DOB 1956/03/14, MRN [id number]  PCP:  Cleatus Arlyss GORMAN, MD  [facility] Cardiologist:  Evalene Lunger, MD  [facility] Electrophysiologist:  [name], MD    Interval History:     [name] is a 68 y.o. female who presents for a follow up visit.   The patient last saw Elvie April 23, 2024.  The patient has a history of chronic systolic heart failure, CRT-D in situ, PVCs, coronary artery disease post PCI, hypertension, peripheral vascular disease, diabetes, CKD 3, sleep apnea.  She had a CRT-D extracted in the past for MSSA bacteremia and then reimplanted in 2020.  She had recovery of EF after reimplant.  She has chronic phrenic stimulation with her device.  This has been unavoidable with reprogramming.  She is with her mom today in clinic.  She reports fatigue.  She was recently started on a blood thinner after an echo was concerning for apical thrombus.  She has an MRI planned.  Dr. Argentina is her primary cardiologist.      Past medical, surgical, social and family history were reviewed.  ROS:   Please see the history of present illness.    All other systems reviewed and are negative.  EKGs/Labs/Other Studies Reviewed:    The following studies were reviewed today:  September 22, 2024 in-clinic device interrogation personally reviewed Battery and lead parameter stable        Physical Exam:    VS:  BP 116/64 (BP Location: Left Arm, Patient Position: Sitting, Cuff Size: Large)   Pulse 80   Ht 5' 8 (1.727 m)   Wt 248 lb (112.5 kg)   SpO2 98%   BMI 37.71 kg/m     Wt Readings from Last 3 Encounters:  09/22/24 248 lb (112.5 kg)  09/16/24 248 lb (112.5 kg)  07/26/24 250 lb (113.4 kg)     GEN: no distress CARD: RRR, No MRG.  Generator pocket well-healed RESP: No IWOB. CTAB.      ASSESSMENT:    1. Chronic systolic congestive heart failure (HCC)    2. NICM (nonischemic cardiomyopathy) (HCC)   3. Encounter for monitoring amiodarone  therapy   4. Presence of cardiac resynchronization therapy defibrillator (CRT-D)    PLAN:    In order of problems listed above:  #Chronic systolic heart failure #CRT-D in situ NYHA class II.  Warm and dry on exam.  EF is normalized Device functioning appropriately.  Continue remote monitoring Follows with Dr.Skipina.  Possible apical thrombus on Eliquis , managed by general cardiology.  Cardiac MRI planned.  #PVCs #Atrial tachycardia #High risk medication monitoring-amiodarone  Continue amiodarone  Update CMP, TSH and free T4 today  I discussed my upcoming departure from [facility] during today's clinic appointment.  The patient will continue to follow-up with one of my EP partners moving forward.  Follow-up 6 months with EP APP  Signed, Ole Holts, MD, [facility], [facility] 09/22/2024 11:17 AM    Electrophysiology [facility]

## 2024-09-20 NOTE — Telephone Encounter (Signed)
 Received a letter from [employer] requesting pt Ins card ,submitted to [facility] [facility] today.[employer] also requesting to fixe pg #8 from provider office ,fixed and faxed to [employer].

## 2024-09-22 ENCOUNTER — Other Ambulatory Visit: Payer: Self-pay | Admitting: Emergency Medicine

## 2024-09-22 ENCOUNTER — Ambulatory Visit: Attending: Cardiology | Admitting: Cardiology

## 2024-09-22 ENCOUNTER — Encounter: Payer: Self-pay | Admitting: Cardiology

## 2024-09-22 VITALS — BP 116/64 | HR 80 | Ht 68.0 in | Wt 248.0 lb

## 2024-09-22 DIAGNOSIS — Z5181 Encounter for therapeutic drug level monitoring: Secondary | ICD-10-CM

## 2024-09-22 DIAGNOSIS — Z79899 Other long term (current) drug therapy: Secondary | ICD-10-CM | POA: Diagnosis not present

## 2024-09-22 DIAGNOSIS — I5022 Chronic systolic (congestive) heart failure: Secondary | ICD-10-CM | POA: Diagnosis not present

## 2024-09-22 DIAGNOSIS — Z95810 Presence of automatic (implantable) cardiac defibrillator: Secondary | ICD-10-CM

## 2024-09-22 DIAGNOSIS — I428 Other cardiomyopathies: Secondary | ICD-10-CM | POA: Diagnosis not present

## 2024-09-22 DIAGNOSIS — R0609 Other forms of dyspnea: Secondary | ICD-10-CM

## 2024-09-22 LAB — CUP PACEART INCLINIC DEVICE CHECK
Date Time Interrogation Session: 20251217115003
Implantable Lead Connection Status: 753985
Implantable Lead Connection Status: 753985
Implantable Lead Connection Status: 753985
Implantable Lead Implant Date: 20200625
Implantable Lead Implant Date: 20200625
Implantable Lead Implant Date: 20200625
Implantable Lead Location: 753858
Implantable Lead Location: 753859
Implantable Lead Location: 753860
Implantable Lead Model: 4398
Implantable Lead Model: 5076
Implantable Lead Model: 6935
Implantable Pulse Generator Implant Date: 20200625

## 2024-09-22 NOTE — Patient Instructions (Signed)
 Medication Instructions:  Your physician recommends that you continue on your current medications as directed. Please refer to the Current Medication list given to you today.  *If you need a refill on your cardiac medications before your next appointment, please call your pharmacy*  Lab Work: TODAY: CMET, TSH, T4  Follow-Up: At [facility], you and your health needs are our priority.  As part of our continuing mission to provide you with exceptional heart care, our providers are all part of one team.  This team includes your primary Cardiologist (physician) and Advanced Practice Providers or APPs (Physician Assistants and Nurse Practitioners) who all work together to provide you with the care you need, when you need it.  Your next appointment:   6 months  Provider:   Suzann Riddle, NP

## 2024-09-23 ENCOUNTER — Ambulatory Visit: Payer: Self-pay

## 2024-09-23 LAB — COMPREHENSIVE METABOLIC PANEL WITH GFR
ALT: 15 IU/L (ref 0–32)
AST: 20 IU/L (ref 0–40)
Albumin: 4.2 g/dL (ref 3.9–4.9)
Alkaline Phosphatase: 96 IU/L (ref 49–135)
BUN/Creatinine Ratio: 20 (ref 12–28)
BUN: 42 mg/dL — ABNORMAL HIGH (ref 8–27)
Bilirubin Total: 0.3 mg/dL (ref 0.0–1.2)
CO2: 24 mmol/L (ref 20–29)
Calcium: 9 mg/dL (ref 8.7–10.3)
Chloride: 104 mmol/L (ref 96–106)
Creatinine, Ser: 2.05 mg/dL — ABNORMAL HIGH (ref 0.57–1.00)
Globulin, Total: 2.3 g/dL (ref 1.5–4.5)
Glucose: 154 mg/dL — ABNORMAL HIGH (ref 70–99)
Potassium: 5.3 mmol/L — ABNORMAL HIGH (ref 3.5–5.2)
Sodium: 142 mmol/L (ref 134–144)
Total Protein: 6.5 g/dL (ref 6.0–8.5)
eGFR: 26 mL/min/1.73 — ABNORMAL LOW (ref >59)

## 2024-09-23 LAB — HEMOGLOBIN AND HEMATOCRIT, BLOOD
Hematocrit: 39.1 % (ref 34.0–46.6)
Hemoglobin: 12.8 g/dL (ref 11.1–15.9)

## 2024-09-23 LAB — TSH: TSH: 0.779 u[IU]/mL (ref 0.450–4.500)

## 2024-09-23 LAB — T4, FREE: Free T4: 1.52 ng/dL (ref 0.82–1.77)

## 2024-09-24 ENCOUNTER — Encounter (HOSPITAL_COMMUNITY): Payer: Self-pay | Admitting: Emergency Medicine

## 2024-09-24 ENCOUNTER — Telehealth (HOSPITAL_COMMUNITY): Payer: Self-pay | Admitting: Emergency Medicine

## 2024-09-24 NOTE — Progress Notes (Signed)
" °  Device system confirmed to be MRI conditional, with implant date > 6 weeks ago, and no evidence of abandoned or epicardial leads in review of most recent CXR  Device last cleared by EP Provider: Daphne Barrack 09/23/24  Clearance is good through for 1 year as long as parameters remain stable at time of check. If pt undergoes a cardiac device procedure during that time, they should be re-cleared.   Tachy-therapies to be programmed off if applicable with device back to pre-MRI settings after completion of exam.  [employer] - Programming recommendation received through [employer] App/Tablet  Rocky Catalan, RT  09/24/2024 7:50 AM     "

## 2024-09-24 NOTE — Telephone Encounter (Signed)
 [name]

## 2024-09-27 ENCOUNTER — Ambulatory Visit: Payer: Self-pay | Admitting: Cardiology

## 2024-09-27 DIAGNOSIS — Z5181 Encounter for therapeutic drug level monitoring: Secondary | ICD-10-CM

## 2024-09-27 DIAGNOSIS — I5022 Chronic systolic (congestive) heart failure: Secondary | ICD-10-CM

## 2024-09-27 NOTE — Telephone Encounter (Signed)
 Called pt and identified her using 2 identifiers;  let the patient know that her labs were within normal limits and it was okay to proceed with her Cardiac MRI on 12/23/  Pt thanked me for calling.  All questions (if any) were answered; pt verbalized understanding.

## 2024-09-28 ENCOUNTER — Ambulatory Visit (HOSPITAL_COMMUNITY)
Admission: RE | Admit: 2024-09-28 | Discharge: 2024-09-28 | Disposition: A | Discharge status: Home / Self Care | Source: Ambulatory Visit

## 2024-09-28 ENCOUNTER — Other Ambulatory Visit: Payer: Self-pay

## 2024-09-28 DIAGNOSIS — I255 Ischemic cardiomyopathy: Secondary | ICD-10-CM

## 2024-09-28 DIAGNOSIS — R0609 Other forms of dyspnea: Secondary | ICD-10-CM | POA: Insufficient documentation

## 2024-09-28 MED ORDER — GADOBUTROL 1 MMOL/ML IV SOLN
10.0000 mL | Freq: Once | INTRAVENOUS | Status: AC | PRN
  Administered 2024-09-28: 10 mL via INTRAVENOUS

## 2024-09-28 NOTE — Progress Notes (Signed)
 Patient was monitored by this RN during MRI scan due to presence of a pacemaker. Cardiac rhythm was continuously monitored throughout the procedure. Prior to the start of the scan, the pacemaker was placed in MRI-safe mode by the MRI technician and/or pacemaker representative. Following the completion of the scan, the device was returned to its pre-MRI settings. Neurological status and orientation post-procedure were unchanged from baseline.   Pre-procedure Heart Rate (Prior to being placed in MRI safe mode): 71   Post-procedure Heart Rate (Once pacemaker is returned to baseline mode): 72

## 2024-09-29 ENCOUNTER — Ambulatory Visit: Payer: Self-pay

## 2024-10-01 ENCOUNTER — Other Ambulatory Visit: Payer: Self-pay | Admitting: *Deleted

## 2024-10-01 MED ORDER — CLOPIDOGREL BISULFATE 75 MG PO TABS: 75.0000 mg | ORAL_TABLET | Freq: Every day | ORAL | 3 refills | Status: AC

## 2024-10-01 NOTE — Progress Notes (Signed)
 See Result note for Cardiac MRI for med changes.

## 2024-10-01 NOTE — Telephone Encounter (Signed)
 Spoke with pt; identified pt using two identifiers; read pt the following results:  Dr. Argentina reviewed your recent Cardiac MRI and noted the following:  Good news, MRI does not show any evidence of blood clot inside of the heart. We can stop the eliquis  and put her back on plavix . No further changes needed.   Pt was thankful for the good news, but still is concerned with what is causing her to be tired.  She stated she would talk with her PCP about testing her for low iron  after I verified that her thyroid  labs were within normal limits.  Pt scheduled for a follow up appointment in March 2026.  Wished each other a Happy New Year.

## 2024-10-04 ENCOUNTER — Other Ambulatory Visit: Payer: Self-pay | Admitting: Family Medicine

## 2024-10-04 NOTE — Telephone Encounter (Signed)
 Requesting: Tramadol  (Ultram  ) 20 mg tablet Contract: No UDS: none Last Visit: 07/13/2024 Next Visit: Visit date not found Last Refill: 06/17/24  Please Advise

## 2024-10-06 ENCOUNTER — Other Ambulatory Visit (HOSPITAL_COMMUNITY): Payer: Self-pay

## 2024-10-06 NOTE — Telephone Encounter (Signed)
 Received pt portion [employer]ME([employer]) faxed to [employer] along provider portion.

## 2024-10-08 NOTE — Telephone Encounter (Signed)
 Received approval letter from [employer]  thru 10/06/2025 approval letter index.

## 2024-10-10 NOTE — Telephone Encounter (Signed)
 Sent. Thanks.

## 2024-10-19 ENCOUNTER — Ambulatory Visit: Admitting: Family Medicine

## 2024-10-19 ENCOUNTER — Encounter: Payer: Self-pay | Admitting: Family Medicine

## 2024-10-19 VITALS — BP 118/70 | HR 75 | Temp 98.3°F | Ht 68.0 in | Wt 250.4 lb

## 2024-10-19 DIAGNOSIS — L989 Disorder of the skin and subcutaneous tissue, unspecified: Secondary | ICD-10-CM | POA: Diagnosis not present

## 2024-10-19 DIAGNOSIS — Z862 Personal history of diseases of the blood and blood-forming organs and certain disorders involving the immune mechanism: Secondary | ICD-10-CM | POA: Diagnosis not present

## 2024-10-19 DIAGNOSIS — R5383 Other fatigue: Secondary | ICD-10-CM

## 2024-10-19 MED ORDER — SEMAGLUTIDE (1 MG/DOSE) 4 MG/3ML ~~LOC~~ SOPN: 1.0000 mg | PEN_INJECTOR | SUBCUTANEOUS | 5 refills | Status: AC

## 2024-10-19 NOTE — Patient Instructions (Signed)
 Go to the lab on the way out.   If you have mychart we'll likely use that to update you.    Take care.  Glad to see you.

## 2024-10-19 NOTE — Progress Notes (Unsigned)
 Fatigue for the last 3 months.  HGB was lower on most recent check.  No bloody or black stools.  No bleeding.  No CP.  Recent TSH wnl. She is exhausted but not SOB.  H/o distant iron  infusions.   She has some sleep fragmentation, occ napping.  D/w pt about sleep hygiene, etc.   Rx sent for semaglutide , for patient to price check.   Meds, vitals, and allergies reviewed.   ROS: Per HPI unless specifically indicated in ROS section

## 2024-10-20 DIAGNOSIS — L989 Disorder of the skin and subcutaneous tissue, unspecified: Secondary | ICD-10-CM | POA: Insufficient documentation

## 2024-10-20 DIAGNOSIS — R5383 Other fatigue: Secondary | ICD-10-CM | POA: Insufficient documentation

## 2024-10-20 LAB — CBC WITH DIFFERENTIAL/PLATELET
Basophils Absolute: 0.1 K/uL (ref 0.0–0.1)
Basophils Relative: 1.2 % (ref 0.0–3.0)
Eosinophils Absolute: 0.1 K/uL (ref 0.0–0.7)
Eosinophils Relative: 0.8 % (ref 0.0–5.0)
HCT: 38.9 % (ref 36.0–46.0)
Hemoglobin: 13.2 g/dL (ref 12.0–15.0)
Lymphocytes Relative: 22.1 % (ref 12.0–46.0)
Lymphs Abs: 1.7 K/uL (ref 0.7–4.0)
MCHC: 33.9 g/dL (ref 30.0–36.0)
MCV: 90.1 fl (ref 78.0–100.0)
Monocytes Absolute: 0.5 K/uL (ref 0.1–1.0)
Monocytes Relative: 6.6 % (ref 3.0–12.0)
Neutro Abs: 5.2 K/uL (ref 1.4–7.7)
Neutrophils Relative %: 69.3 % (ref 43.0–77.0)
Platelets: 182 K/uL (ref 150.0–400.0)
RBC: 4.31 Mil/uL (ref 3.87–5.11)
RDW: 13.8 % (ref 11.5–15.5)
WBC: 7.5 K/uL (ref 4.0–10.5)

## 2024-10-20 LAB — VITAMIN B12: Vitamin B-12: 433 pg/mL (ref 211–911)

## 2024-10-20 LAB — FERRITIN: Ferritin: 123.8 ng/mL (ref 10.0–291.0)

## 2024-10-20 NOTE — Assessment & Plan Note (Signed)
 Advised to use Polysporin and not pick at the areas.  They should heal.  Update me as needed.

## 2024-10-20 NOTE — Assessment & Plan Note (Signed)
 Unclear source.  See notes on labs.  Discussed sleep hygiene.

## 2024-10-24 ENCOUNTER — Ambulatory Visit: Payer: Self-pay | Admitting: Family Medicine

## 2024-11-01 ENCOUNTER — Ambulatory Visit (INDEPENDENT_AMBULATORY_CARE_PROVIDER_SITE_OTHER): Payer: PPO

## 2024-11-01 DIAGNOSIS — I5022 Chronic systolic (congestive) heart failure: Secondary | ICD-10-CM

## 2024-11-01 LAB — CUP PACEART REMOTE DEVICE CHECK
Battery Remaining Longevity: 11 mo
Battery Voltage: 2.88 V
Brady Statistic AP VP Percent: 76.7 %
Brady Statistic AP VS Percent: 0.05 %
Brady Statistic AS VP Percent: 23.23 %
Brady Statistic AS VS Percent: 0.02 %
Brady Statistic RA Percent Paced: 76.16 %
Brady Statistic RV Percent Paced: 99.13 %
Date Time Interrogation Session: 20260126043823
HighPow Impedance: 62 Ohm
Implantable Lead Connection Status: 753985
Implantable Lead Connection Status: 753985
Implantable Lead Connection Status: 753985
Implantable Lead Implant Date: 20200625
Implantable Lead Implant Date: 20200625
Implantable Lead Implant Date: 20200625
Implantable Lead Location: 753858
Implantable Lead Location: 753859
Implantable Lead Location: 753860
Implantable Lead Model: 4398
Implantable Lead Model: 5076
Implantable Lead Model: 6935
Implantable Pulse Generator Implant Date: 20200625
Lead Channel Impedance Value: 1140 Ohm
Lead Channel Impedance Value: 1178 Ohm
Lead Channel Impedance Value: 211.021
Lead Channel Impedance Value: 211.021
Lead Channel Impedance Value: 230.073
Lead Channel Impedance Value: 308.894
Lead Channel Impedance Value: 308.894
Lead Channel Impedance Value: 342 Ohm
Lead Channel Impedance Value: 361 Ohm
Lead Channel Impedance Value: 399 Ohm
Lead Channel Impedance Value: 456 Ohm
Lead Channel Impedance Value: 551 Ohm
Lead Channel Impedance Value: 551 Ohm
Lead Channel Impedance Value: 703 Ohm
Lead Channel Impedance Value: 779 Ohm
Lead Channel Impedance Value: 817 Ohm
Lead Channel Impedance Value: 836 Ohm
Lead Channel Impedance Value: 988 Ohm
Lead Channel Pacing Threshold Amplitude: 0.625 V
Lead Channel Pacing Threshold Amplitude: 0.625 V
Lead Channel Pacing Threshold Amplitude: 3.25 V
Lead Channel Pacing Threshold Pulse Width: 0.4 ms
Lead Channel Pacing Threshold Pulse Width: 0.4 ms
Lead Channel Pacing Threshold Pulse Width: 1 ms
Lead Channel Sensing Intrinsic Amplitude: 1.125 mV
Lead Channel Sensing Intrinsic Amplitude: 1.125 mV
Lead Channel Sensing Intrinsic Amplitude: 18.875 mV
Lead Channel Sensing Intrinsic Amplitude: 18.875 mV
Lead Channel Setting Pacing Amplitude: 1.75 V
Lead Channel Setting Pacing Amplitude: 2 V
Lead Channel Setting Pacing Amplitude: 3.5 V
Lead Channel Setting Pacing Pulse Width: 0.4 ms
Lead Channel Setting Pacing Pulse Width: 1 ms
Lead Channel Setting Sensing Sensitivity: 0.3 mV
Zone Setting Status: 755011
Zone Setting Status: 755011

## 2024-11-02 ENCOUNTER — Ambulatory Visit: Payer: Self-pay | Admitting: Cardiology

## 2024-11-03 NOTE — Telephone Encounter (Signed)
 error

## 2024-11-05 NOTE — Progress Notes (Signed)
 Remote ICD Transmission

## 2024-12-16 ENCOUNTER — Ambulatory Visit

## 2025-01-03 ENCOUNTER — Ambulatory Visit: Admitting: Neurology

## 2025-01-31 ENCOUNTER — Encounter

## 2025-03-18 ENCOUNTER — Ambulatory Visit

## 2025-05-02 ENCOUNTER — Encounter

## 2025-08-01 ENCOUNTER — Encounter

## 2025-10-31 ENCOUNTER — Encounter

## 2026-01-30 ENCOUNTER — Encounter

## 2026-05-01 ENCOUNTER — Encounter
